# Patient Record
Sex: Male | Born: 1975
Health system: Southern US, Community
[De-identification: ages and names within clinical notes are randomized; demographics above are authoritative.]

## PROBLEM LIST (undated history)

## (undated) DIAGNOSIS — I10 Essential (primary) hypertension: Secondary | ICD-10-CM

## (undated) DIAGNOSIS — Z9281 Personal history of extracorporeal membrane oxygenation (ECMO): Secondary | ICD-10-CM

## (undated) DIAGNOSIS — D72819 Decreased white blood cell count, unspecified: Secondary | ICD-10-CM

## (undated) DIAGNOSIS — R519 Headache, unspecified: Secondary | ICD-10-CM

## (undated) DIAGNOSIS — R0602 Shortness of breath: Secondary | ICD-10-CM

## (undated) DIAGNOSIS — D696 Thrombocytopenia, unspecified: Secondary | ICD-10-CM

## (undated) DIAGNOSIS — Z8616 Personal history of COVID-19: Secondary | ICD-10-CM

## (undated) DIAGNOSIS — R239 Unspecified skin changes: Secondary | ICD-10-CM

## (undated) DIAGNOSIS — Z7189 Other specified counseling: Secondary | ICD-10-CM

## (undated) DIAGNOSIS — J45909 Unspecified asthma, uncomplicated: Secondary | ICD-10-CM

## (undated) DIAGNOSIS — R7303 Prediabetes: Secondary | ICD-10-CM

## (undated) DIAGNOSIS — Z87442 Personal history of urinary calculi: Secondary | ICD-10-CM

## (undated) DIAGNOSIS — K859 Acute pancreatitis without necrosis or infection, unspecified: Secondary | ICD-10-CM

## (undated) DIAGNOSIS — Z515 Encounter for palliative care: Secondary | ICD-10-CM

## (undated) DIAGNOSIS — I209 Angina pectoris, unspecified: Secondary | ICD-10-CM

## (undated) DIAGNOSIS — Z9981 Dependence on supplemental oxygen: Secondary | ICD-10-CM

## (undated) DIAGNOSIS — R4589 Other symptoms and signs involving emotional state: Secondary | ICD-10-CM

## (undated) DIAGNOSIS — Z9689 Presence of other specified functional implants: Secondary | ICD-10-CM

## (undated) DIAGNOSIS — R0600 Dyspnea, unspecified: Secondary | ICD-10-CM

## (undated) DIAGNOSIS — A499 Bacterial infection, unspecified: Secondary | ICD-10-CM

## (undated) DIAGNOSIS — Z01810 Encounter for preprocedural cardiovascular examination: Secondary | ICD-10-CM

## (undated) DIAGNOSIS — R509 Fever, unspecified: Secondary | ICD-10-CM

## (undated) DIAGNOSIS — R234 Changes in skin texture: Secondary | ICD-10-CM

## (undated) DIAGNOSIS — F419 Anxiety disorder, unspecified: Secondary | ICD-10-CM

## (undated) DIAGNOSIS — R1013 Epigastric pain: Secondary | ICD-10-CM

## (undated) DIAGNOSIS — K219 Gastro-esophageal reflux disease without esophagitis: Secondary | ICD-10-CM

## (undated) DIAGNOSIS — F32A Depression, unspecified: Secondary | ICD-10-CM

## (undated) HISTORY — DX: Shortness of breath: R06.02

## (undated) HISTORY — DX: Epigastric pain: R10.13

## (undated) HISTORY — DX: Essential (primary) hypertension: I10

## (undated) HISTORY — DX: Other specified counseling: Z71.89

## (undated) HISTORY — DX: Encounter for palliative care: Z51.5

## (undated) HISTORY — DX: Presence of other specified functional implants: Z96.89

## (undated) HISTORY — DX: Encounter for preprocedural cardiovascular examination: Z01.810

## (undated) HISTORY — DX: Acute pancreatitis without necrosis or infection, unspecified: K85.90

## (undated) HISTORY — DX: Thrombocytopenia, unspecified: D69.6

## (undated) HISTORY — DX: Unspecified skin changes: R23.9

## (undated) HISTORY — PX: PORT-A-CATH REMOVAL: SHX5289

## (undated) HISTORY — DX: Depression, unspecified: F32.A

## (undated) HISTORY — DX: Fever, unspecified: R50.9

## (undated) HISTORY — DX: Changes in skin texture: R23.4

## (undated) HISTORY — DX: Personal history of COVID-19: Z86.16

## (undated) HISTORY — DX: Decreased white blood cell count, unspecified: D72.819

## (undated) HISTORY — DX: Prediabetes: R73.03

## (undated) HISTORY — DX: Bacterial infection, unspecified: A49.9

## (undated) HISTORY — DX: Personal history of extracorporeal membrane oxygenation (ECMO): Z92.81

## (undated) HISTORY — DX: Other symptoms and signs involving emotional state: R45.89

## (undated) HISTORY — DX: Unspecified asthma, uncomplicated: J45.909

---

## 2000-08-08 HISTORY — PX: LUMBAR DISC SURGERY: SHX700

## 2000-10-27 ENCOUNTER — Emergency Department (HOSPITAL_COMMUNITY): Admission: EM | Admit: 2000-10-27 | Discharge: 2000-10-27 | Payer: Self-pay | Admitting: Emergency Medicine

## 2001-06-25 ENCOUNTER — Encounter: Payer: Self-pay | Admitting: Neurosurgery

## 2001-06-27 ENCOUNTER — Ambulatory Visit (HOSPITAL_COMMUNITY): Admission: RE | Admit: 2001-06-27 | Discharge: 2001-06-28 | Payer: Self-pay | Admitting: Neurosurgery

## 2001-06-27 ENCOUNTER — Encounter: Payer: Self-pay | Admitting: Neurosurgery

## 2001-08-27 ENCOUNTER — Emergency Department (HOSPITAL_COMMUNITY): Admission: EM | Admit: 2001-08-27 | Discharge: 2001-08-27 | Payer: Self-pay | Admitting: *Deleted

## 2005-10-12 ENCOUNTER — Emergency Department (HOSPITAL_COMMUNITY): Admission: EM | Admit: 2005-10-12 | Discharge: 2005-10-12 | Payer: Self-pay | Admitting: Family Medicine

## 2006-10-03 ENCOUNTER — Emergency Department (HOSPITAL_COMMUNITY): Admission: EM | Admit: 2006-10-03 | Discharge: 2006-10-03 | Payer: Self-pay | Admitting: Family Medicine

## 2008-01-07 ENCOUNTER — Emergency Department (HOSPITAL_COMMUNITY): Admission: EM | Admit: 2008-01-07 | Discharge: 2008-01-07 | Payer: Self-pay | Admitting: Emergency Medicine

## 2008-11-05 ENCOUNTER — Emergency Department (HOSPITAL_COMMUNITY): Admission: EM | Admit: 2008-11-05 | Discharge: 2008-11-05 | Payer: Self-pay | Admitting: Family Medicine

## 2009-09-17 ENCOUNTER — Emergency Department (HOSPITAL_COMMUNITY): Admission: EM | Admit: 2009-09-17 | Discharge: 2009-09-17 | Payer: Self-pay | Admitting: Emergency Medicine

## 2009-10-08 ENCOUNTER — Ambulatory Visit: Payer: Self-pay | Admitting: Sports Medicine

## 2009-10-08 DIAGNOSIS — G5750 Tarsal tunnel syndrome, unspecified lower limb: Secondary | ICD-10-CM | POA: Insufficient documentation

## 2009-10-08 DIAGNOSIS — M2140 Flat foot [pes planus] (acquired), unspecified foot: Secondary | ICD-10-CM

## 2009-10-08 HISTORY — DX: Flat foot (pes planus) (acquired), unspecified foot: M21.40

## 2009-10-08 HISTORY — DX: Tarsal tunnel syndrome, unspecified lower limb: G57.50

## 2009-11-12 ENCOUNTER — Ambulatory Visit: Payer: Self-pay | Admitting: Sports Medicine

## 2009-11-12 DIAGNOSIS — R269 Unspecified abnormalities of gait and mobility: Secondary | ICD-10-CM | POA: Insufficient documentation

## 2009-11-12 HISTORY — DX: Unspecified abnormalities of gait and mobility: R26.9

## 2009-12-14 ENCOUNTER — Emergency Department (HOSPITAL_COMMUNITY): Admission: EM | Admit: 2009-12-14 | Discharge: 2009-12-14 | Payer: Self-pay | Admitting: Emergency Medicine

## 2010-09-07 NOTE — Assessment & Plan Note (Signed)
Summary: FOOT PAIN,MC   Vital Signs:  Patient profile:   35 year old male Height:      68 inches Weight:      278 pounds BMI:     42.42 BP sitting:   146 / 77  Vitals Entered By: Lillia Pauls CMA (October 08, 2009 9:23 AM)  History of Present Illness: 35 yo M here with left foot pain.  No known injury Started about 2 years ago and has progressed Worse at end of day more than at beginning Not worse with first step in the morning.  Pain mostly medial ankle, into medial arch and sometimes onto dorsum of foot - feels sharp at times No numbness or tingling No issues with right foot Has not sought care or treatment for this in past.  Physical Exam  General:  Well-developed, overweight male in NAD Msk:  L ankle/foot: Significant pes planus with almost subluxation of ankle.  Ambulates with outturned bilateral feet No focal TTP currently (not in pain now). FROM ankle and toes Strength 5/5 all motions Arch corrects with post tib firing (up on toes). Neg tinels at tarsal tunnel NV intact distally. Additional Exam:  MSK u/s:  Target sign of post tib L > R - fluid in tarsal tunnel on left side only.  No tears of post tib or flexor digitorum.  Thickened post tib as well.  Plantar fascia at upper limit of normal but no abnormalities.  Images saved for documentation.   Impression & Recommendations:  Problem # 1:  TARSAL TUNNEL SYNDROME, LEFT (ICD-355.5) Assessment New  Comforthotics with small scaphoid pad to correct pes planus and stretch on tarsal tunnel.  No evidence of tears of tendons.  Exercises for post tib (toe raises, theraband int rotation and plantarflexion).  Orders: [employer] [phone])  Problem # 2:  PES PLANUS (ICD-734) Assessment: Unchanged comforthotics with scaphoid pad.  Orders: [employer] [phone])  Appended Document: FOOT PAIN,MC Will f/u in 4-6 weeks for reeval, consideration of custom orthotics vs continued comforthotics

## 2010-09-07 NOTE — Assessment & Plan Note (Signed)
Summary: F/U FOOT,MC   History of Present Illness: 35 yo M here for f/u L foot pain  No injury Started about 2 years ago and progressed Worse at end of day more than at beginning Is on feet a lot at work. No numbness or tingling. Pain mostly medial ankle, into medial arch and sometimes onto dorsum of foot - feels sharp at times No issues with right foot Has been doing pigeon toe walking and theraband exercises regularly Using comforthotics with scaphoid pads Is about 60% improved from last visit  Physical Exam  General:  Well-developed, overweight male in NAD Msk:  L ankle/foot: Significant pes planus with almost subluxation of ankle.  Ambulates with outturned bilateral feet No focal TTP. FROM ankle and toes Strength 5/5 all motions 1+ talar tilt, neg ant drawer. Arch corrects with post tib firing. Neg tinels at tarsal tunnel NV intact distally.   Impression & Recommendations:  Problem # 1:  TARSAL TUNNEL SYNDROME, LEFT (ICD-355.5) Assessment Improved Much better - will check with insurance company re: coverage for custom orthotics.  F/u with [place] in 6 weeks for reevaluation, consideration of making custom orthotics vs giving him the [employer] booklet and showing him how to order the comforthotics and scaphoid pads.  Will bring work shoes to that visit.  Problem # 2:  PES PLANUS (ICD-734) Assessment: Unchanged better support with comforthotics and scaphoid pads.  See #1 above.  Problem # 3:  ABNORMALITY OF GAIT (ICD-781.2) Assessment: Improved improved with comforthotics and scaphoid pads.

## 2010-11-02 ENCOUNTER — Inpatient Hospital Stay (INDEPENDENT_AMBULATORY_CARE_PROVIDER_SITE_OTHER)
Admission: RE | Admit: 2010-11-02 | Discharge: 2010-11-02 | Disposition: A | Discharge status: Home / Self Care | Payer: Managed Care, Other (non HMO) | Source: Ambulatory Visit | Attending: Emergency Medicine | Admitting: Emergency Medicine

## 2010-11-02 DIAGNOSIS — IMO0002 Reserved for concepts with insufficient information to code with codable children: Secondary | ICD-10-CM

## 2010-12-24 NOTE — H&P (Signed)
[place]. [facility]  Patient:    [name], [name] Visit Number: [id number] MRN: [id number]          Service Type: DSU Location: [facility] 3172 01 Attending Physician:  Colon Branch Dictated by:   Clydene Fake, M.D. Admit Date:  06/27/2001                           History and Physical  CHIEF COMPLAINT:  Left leg pain.  HISTORY OF PRESENT ILLNESS:  The patient is a 35 year old, right-handed gentleman who ______.  He started having some left-sided back pain that was fairly severe.  He started having more and more left leg pain.  The back pain subsided some, but the leg pain has continued.  He has been on multiple nonsteroidal anti-inflammatory medications, steroid dose packs, and pain medications.  These helped temporarily, but he continues having leg pain. Overall he thinks are getting worse as far as the pain down his leg goes.  He may have a little leg weakness, but no specific area, having noticed only mild intermittent numbness.  No right-sided symptoms.  Minimal back pain.  No bowel or bladder incontinence or changes.  Sitting too long or ambulating too long or too active also increases the pain.  He has been out of work most of the time since August due to the pain.  PAST MEDICAL HISTORY:  Significant for asthma.  No previous operations.  CURRENT MEDICATIONS:  Nexium, Advair, Flonase, Maxair inhaler p.r.n.  ALLERGIES:  No known drug allergies.  SOCIAL HISTORY:  He is married.  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  He does not smoke or drink alcohol.  No litigation pending.  REVIEW OF SYSTEMS:  Otherwise negative.  FAMILY HISTORY:  Noncontributory.  PHYSICAL EXAMINATION:  Weight 288 pounds.  Height 5 feet 8 inches.  VITAL SIGNS:  Blood pressure 160/87, pulse 79, temperature 98.6 degrees.  HEENT:  Unremarkable.  NECK:  Supple.  LUNGS:  Clear.  HEART:  Regular rhythm.  ABDOMEN:  Obese, soft, and nontender.  EXTREMITIES:  Intact.   No edema.  BACK:  Range of motion of the back done well in flexion and extension, but increase in radicular pain is brought on with the flexion and left lateral bending.  NEUROLOGIC:  Gait is normal.  He can ambulate on heels and toes.  He gets on his toes 10 times.  Straight leg raising is positive on the left and negative on the right.  Motor strength and sensation are intact.  Deep tendon reflexes are slightly decreased in the left ankle compared to the right.  LABORATORY DATA:  Review of the MRI of the lumbar spine shows degenerative disk disease at L5-S1 with very large central and left-sided disk herniation with extruded fragment at the left side with compression of the L5 and S1 roots and some canal compromise also.  ASSESSMENT AND PLAN:  The patient has a large herniated disk.  The patient will be admitted for diskectomy. Dictated by:   Clydene Fake, M.D. Attending Physician:  Colon Branch DD:  06/27/01 TD:  06/27/01 Job: [id number]

## 2010-12-24 NOTE — Op Note (Signed)
[place]. [facility]  Patient:    [name], [name] Visit Number: [id number] MRN: [id number]          Service Type: DSU Location: 3000 3022 01 Attending Physician:  Colon Branch Dictated by:   Clydene Fake, M.D. Proc. Date: 06/27/01 Admit Date:  06/27/2001                             Operative Report  PREOPERATIVE DIAGNOSIS:   Herniated nucleus pulposus left L5-S1.  POSTOPERATIVE DIAGNOSIS:  Herniated nucleus pulposus left L5-S1.  OPERATION:  Left L5-S1 semi-hemilaminectomy and diskectomy, microdissection with microscope.  SURGEON:  Clydene Fake, M.D.  ASSISTANT:  Izell Moyock. Elesa Hacker, M.D.  ANESTHESIA:  General endotracheal tube anesthesia.  ESTIMATED BLOOD LOSS:  Minimal.  BLOOD GIVEN:  None.  DRAINS:  None.  COMPLICATIONS:  None.  INDICATIONS: The patient is a 35 year old gentleman who for three months has had severe leg pain radiating down S1 distribution with some general weakness and tingling.  An MRI was done showing a large central and left sided disk rupture at L5-S1 causing compression and canal stenosis.  The patient has only been helped intermittently and temporarily by various medications.  The patient is brought in for a diskectomy.  DESCRIPTION OF PROCEDURE:  The patient was brought into the operating room. General anesthesia was induced.  The patient was placed in the prone position on the Wilson frame with all pressure points padded.  The patient was prepped and draped in a sterile fashion.  A needle was placed in the midline of the lumbar spine at where was thought to be the L5-S1 interspace.  X-ray was obtained showing this was at the L5 spinous process.  Incision was then made centered just caudally to where the needle entered the skin.  Incision was taken down to the fascia, and hemostasis was obtained with Bovie cauterization.  The fascia was incised over the L5-S1 spinous processes and subperiosteal dissection was  done over the lamina to the facet.  A marker was placed at the interspace and another x-ray was obtained showing this was the L5-S1 interspace.  Microscope was brought onto the field for microdissection at this point.  The high speed drill and the Kerrison punches were used to perform a semi-hemilaminectomy at the L5-S1 level.  Foraminotomy was done on the S1 root.  The thecal sac was reflected medially and large disk fragment was seen.  Disk space was incised with a 15 blade and diskectomy was performed with pituitary rongeurs and the curets.  After diskectomy, the thecal sac and nerve roots were decompressed.  There was no pressure on the L5-S1 root.  The wound was irrigated with antibiotic solution.  Gelfoam and thrombin was placed in the epidural space and this was irrigated down.  The fascia was then closed with 0 Vicryl interrupted sutures.  The subcutaneous tissue was closed with 0, 2-0 and 3-0 Vicryl interrupted sutures, and the skin closed with Steri-Strips. Dressing was placed.  The patient was placed back in the supine position and awakened from anesthesia, transferred to the recovery room in stable condition. Dictated by:   Clydene Fake, M.D. Attending Physician:  Colon Branch DD:  06/27/01 TD:  06/28/01 Job: [id number]

## 2011-11-28 IMAGING — CR DG FOOT COMPLETE 3+V*L*
3 series · 3 of 3 positions shown · non-contrast
Comparison: None

CLINICAL DATA: Left foot pain

LEFT FOOT - COMPLETE 3+ VIEW

[view not recorded (1 of 3)]
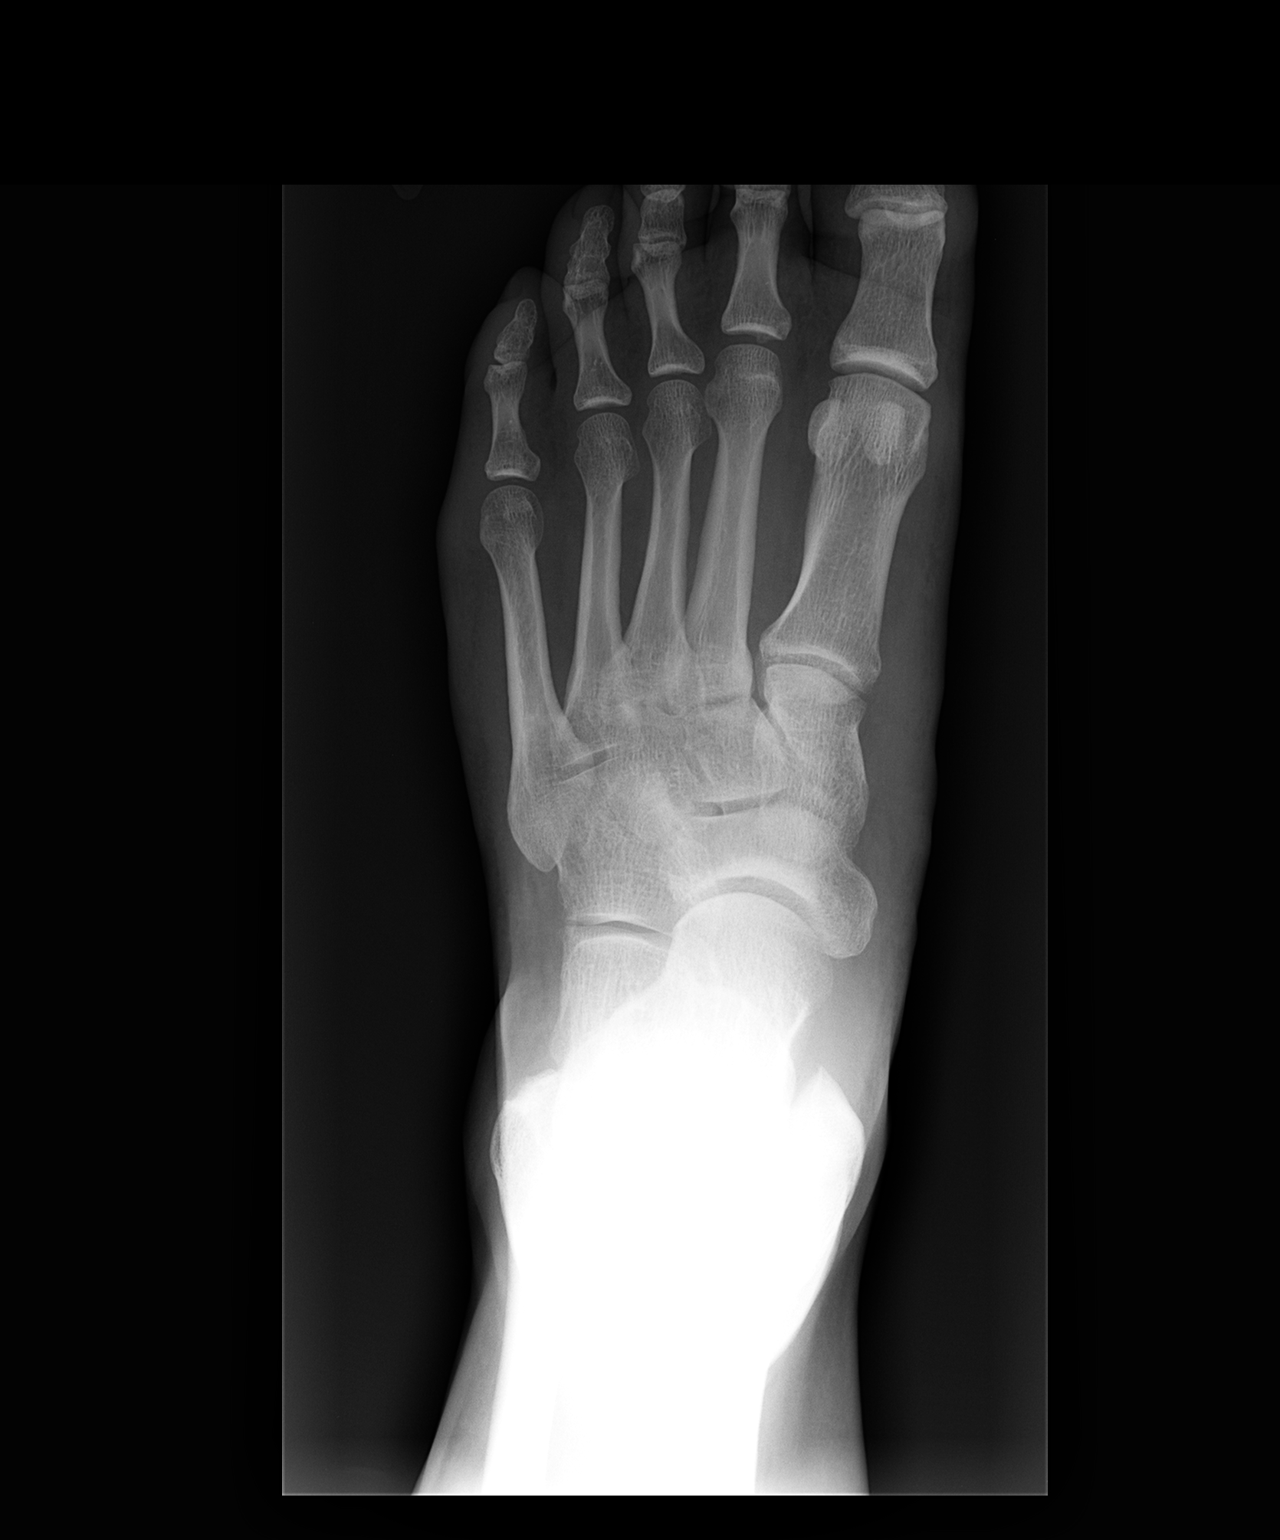

[view not recorded (2 of 3)]
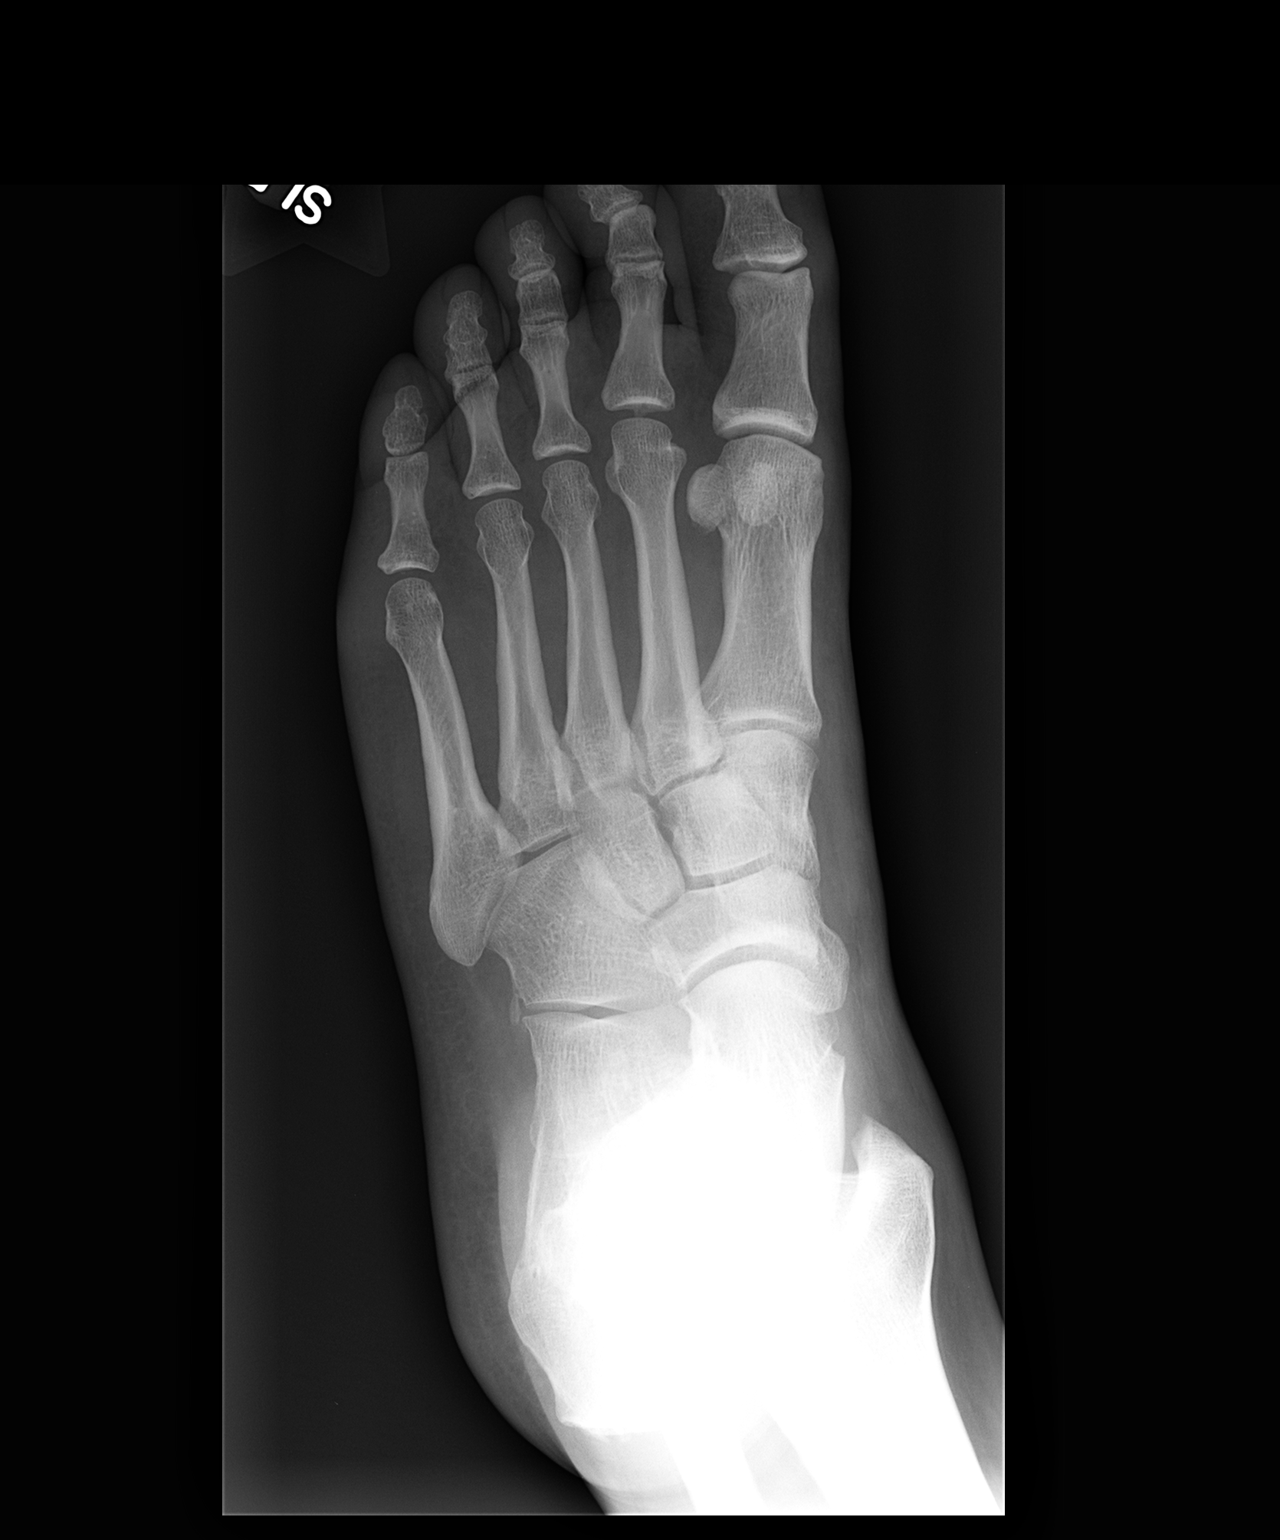

[view not recorded (3 of 3)]
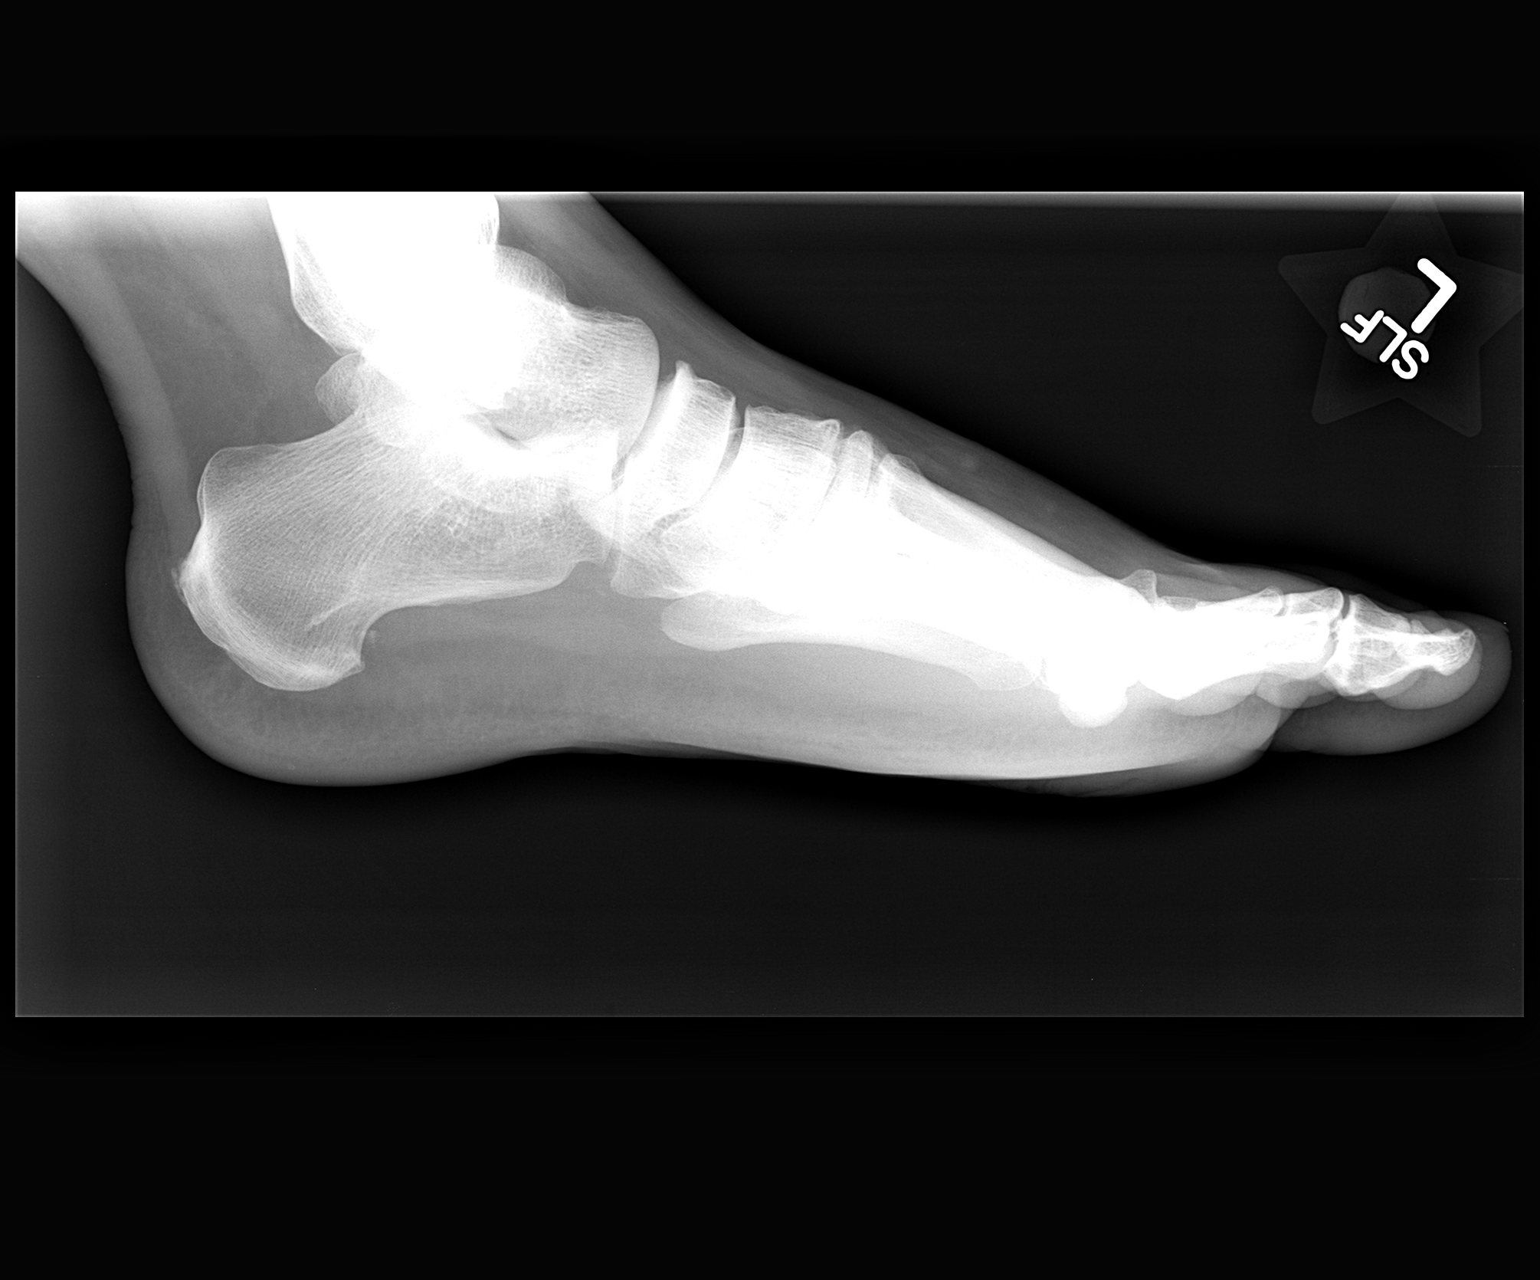

[3 of 3 positions shown; findings below may reference images not displayed]

FINDINGS: No evidence of acute fracture, dislocation or bony
lesion.  Mild degenerative changes are seen in the tarsal bones.
IMPRESSION: No acute findings.

## 2015-07-21 ENCOUNTER — Ambulatory Visit (INDEPENDENT_AMBULATORY_CARE_PROVIDER_SITE_OTHER): Payer: Managed Care, Other (non HMO) | Admitting: Allergy and Immunology

## 2015-07-21 ENCOUNTER — Encounter: Payer: Self-pay | Admitting: Allergy and Immunology

## 2015-07-21 VITALS — BP 152/102 | HR 72 | Resp 20

## 2015-07-21 DIAGNOSIS — K219 Gastro-esophageal reflux disease without esophagitis: Secondary | ICD-10-CM

## 2015-07-21 DIAGNOSIS — J4540 Moderate persistent asthma, uncomplicated: Secondary | ICD-10-CM

## 2015-07-21 DIAGNOSIS — H1010 Acute atopic conjunctivitis, unspecified eye: Secondary | ICD-10-CM | POA: Insufficient documentation

## 2015-07-21 DIAGNOSIS — J4541 Moderate persistent asthma with (acute) exacerbation: Secondary | ICD-10-CM

## 2015-07-21 DIAGNOSIS — J309 Allergic rhinitis, unspecified: Secondary | ICD-10-CM

## 2015-07-21 HISTORY — DX: Moderate persistent asthma, uncomplicated: J45.40

## 2015-07-21 HISTORY — DX: Acute atopic conjunctivitis, unspecified eye: H10.10

## 2015-07-21 MED ORDER — METHYLPREDNISOLONE ACETATE 80 MG/ML IJ SUSP
80.0000 mg | Freq: Once | INTRAMUSCULAR | Status: AC
  Administered 2015-07-21: 80 mg via INTRAMUSCULAR

## 2015-07-21 NOTE — Patient Instructions (Signed)
  1. Depo-Medrol 80 IM today  2. Dulera 200 2 inhalations twice a day - samples  3. OTC Rhinocort one spray each nostril one time per day  4. Dexilant 60 mg one tablet one time per day - samples  5. Zyrtec and pro-air respiclick if needed  6. Yearly flu vaccine  7. Return in 6 months or earlier if problem

## 2015-07-21 NOTE — Progress Notes (Signed)
[facility]  Follow-up Note  Refering Provider: No ref. provider found Primary Provider: Tamsen Roers, MD  Subjective:   [name] is a 39 y.o. male who returns to the [facility] in re-evaluation of the following:  HPI Comments:  [name] returns to this clinic on 07/21/2015 in reevaluation of his asthma and allergic rhinoconjunctivitis. He's done quite well over the course of the past 6 months while consistently using his Dulera. She has not required a systemic steroid to treat his asthma and rarely uses any short acting bronchodilator. However, 2 weeks ago in conjunction with nasal congestion and sneezing and some postnasal drip he developed coughing and wheezing that is been progressive. He does not have any ugly nasal discharge or ugly sputum production or anosmia or fever. His reflux is under good control while using his Dexilant.   Outpatient Encounter Prescriptions as of 07/21/2015  Medication Sig  . cetirizine (ZYRTEC) 10 MG tablet Take 10 mg by mouth daily.  Marland Kitchen dexlansoprazole (DEXILANT) 60 MG capsule Take 60 mg by mouth daily.  . mometasone-formoterol (DULERA) 200-5 MCG/ACT AERO Inhale 2 puffs into the lungs 2 (two) times daily.  Marland Kitchen PROAIR RESPICLICK 209 (90 BASE) MCG/ACT AEPB Inhale 2 Doses into the lungs every 4 (four) hours as needed.   No facility-administered encounter medications on file as of 07/21/2015.    No orders of the defined types were placed in this encounter.    Past Medical History  Diagnosis Date  . Asthma     History reviewed. No pertinent past surgical history.  Allergies no known allergies  Review of Systems  Constitutional: Negative for fever, chills and fatigue.  HENT: Positive for congestion and sneezing. Negative for ear pain, facial swelling, hearing loss, nosebleeds, postnasal drip, rhinorrhea, sinus pressure, sore throat, tinnitus, trouble swallowing and voice change.    Eyes: Negative for pain, discharge, redness and itching.  Respiratory: Positive for cough and wheezing. Negative for chest tightness and shortness of breath.   Cardiovascular: Negative for chest pain and leg swelling.  Gastrointestinal: Negative for nausea, vomiting and abdominal pain.  Musculoskeletal: Negative for myalgias and arthralgias.  Skin: Negative for rash.  Allergic/Immunologic: Negative.  Negative for immunocompromised state.  Neurological: Negative for dizziness and headaches.  Hematological: Negative for adenopathy. Does not bruise/bleed easily.     Objective:   Filed Vitals:   07/21/15 1815  BP: 152/102  Pulse: 72  Resp: 20          Physical Exam  Constitutional: He appears well-developed and well-nourished. No distress.  HENT:  Head: Normocephalic and atraumatic. Head is without right periorbital erythema and without left periorbital erythema.  Right Ear: Tympanic membrane, external ear and ear canal normal. No drainage or tenderness. No foreign bodies. Tympanic membrane is not injected, not scarred, not perforated, not erythematous, not retracted and not bulging. No middle ear effusion.  Left Ear: Tympanic membrane, external ear and ear canal normal. No drainage or tenderness. No foreign bodies. Tympanic membrane is not injected, not scarred, not perforated, not erythematous, not retracted and not bulging.  No middle ear effusion.  Nose: Mucosal edema present. No rhinorrhea, nose lacerations or sinus tenderness.  No foreign bodies.  Mouth/Throat: Oropharynx is clear and moist. No oropharyngeal exudate, posterior oropharyngeal edema, posterior oropharyngeal erythema or tonsillar abscesses.  Eyes: Lids are normal. Right eye exhibits no chemosis, no discharge and no exudate. No foreign body present in the right eye. Left  eye exhibits no chemosis, no discharge and no exudate. No foreign body present in the left eye. Right conjunctiva is not injected. Left conjunctiva is  not injected.  Neck: Neck supple. No tracheal tenderness present. No tracheal deviation and no edema present. No thyroid mass and no thyromegaly present.  Cardiovascular: Normal rate, regular rhythm, S1 normal and S2 normal.  Exam reveals no gallop.   No murmur heard. Pulmonary/Chest: No accessory muscle usage or stridor. No respiratory distress. He has wheezes (end expiratory wheezes). He has no rhonchi. He has no rales.  Abdominal: Soft.  Lymphadenopathy:       Head (right side): No tonsillar adenopathy present.       Head (left side): No tonsillar adenopathy present.    He has no cervical adenopathy.  Neurological: He is alert.  Skin: No rash noted. He is not diaphoretic.  Psychiatric: He has a normal mood and affect. His behavior is normal.    Diagnostics:    Spirometry was performed and demonstrated an FEV1 of 3.71 at 98 % of predicted.  The patient had an Asthma Control Test with the following results: ACT Total Score: 16.    Assessment and Plan:   1. Moderate persistent asthma, with acute exacerbation   2. Allergic rhinoconjunctivitis   3. Gastroesophageal reflux disease, esophagitis presence not specified      1. Depo-Medrol 80 IM today  2. Dulera 200 2 inhalations twice a day - samples  3. OTC Rhinocort one spray each nostril one time per day  4. Dexilant 60 mg one tablet one time per day - samples  5. Zyrtec and pro-air respiclick if needed  6. Yearly flu vaccine  7. Return in 6 months or earlier if problem  [name] is had an exacerbation of his asthma most likely secondary to viral respiratory tract infection that occurred a few weeks back. We'll treat him with the therapy mentioned above and assume he'll do well and see him back in this clinic in 6 months or earlier if there is a problem.   [name], MD [place]

## 2015-10-13 ENCOUNTER — Encounter: Payer: Self-pay | Admitting: Allergy and Immunology

## 2015-10-13 ENCOUNTER — Ambulatory Visit (INDEPENDENT_AMBULATORY_CARE_PROVIDER_SITE_OTHER): Payer: Managed Care, Other (non HMO) | Admitting: Allergy and Immunology

## 2015-10-13 VITALS — BP 140/98 | HR 100 | Resp 18

## 2015-10-13 DIAGNOSIS — H1010 Acute atopic conjunctivitis, unspecified eye: Secondary | ICD-10-CM | POA: Diagnosis not present

## 2015-10-13 DIAGNOSIS — J309 Allergic rhinitis, unspecified: Secondary | ICD-10-CM | POA: Diagnosis not present

## 2015-10-13 DIAGNOSIS — K219 Gastro-esophageal reflux disease without esophagitis: Secondary | ICD-10-CM

## 2015-10-13 DIAGNOSIS — J4541 Moderate persistent asthma with (acute) exacerbation: Secondary | ICD-10-CM | POA: Diagnosis not present

## 2015-10-13 MED ORDER — FLUTICASONE FUROATE-VILANTEROL 200-25 MCG/INH IN AEPB: 1.0000 | INHALATION_SPRAY | Freq: Every day | RESPIRATORY_TRACT | Status: DC

## 2015-10-13 NOTE — Progress Notes (Signed)
Follow-up Note  Referring Provider: Tamsen Roers, MD Primary Provider: Tamsen Roers, MD Date of Office Visit: 10/13/2015  Subjective:   [name] (DOB: Jul 08, 1976) is a 40 y.o. male who returns to the [facility] on 10/13/2015 in re-evaluation of the following:  HPI Comments: [name] returns to this clinic in reevaluation of his asthma and allergic rhinoconjunctivitis and reflux. Since the spring has arrived he has developed problems with ear fullness and some nasal congestion and coughing and wheezing and has had use his bronchodilator over the course of the past 10 days. He is unfortunately out of almost all his medicines. His reflux is doing okay while using over-the-counter omeprazole. Prior to this point in time he did relatively well throughout the fall and winter and did not require any systemic steroids alert than the administration of systemic steroid on December 14 at which time he appeared to have what may of been a viral-induced flare of his respiratory tract disease.    Outpatient Prescriptions Prior to Visit  Medication Sig Dispense Refill  . cetirizine (ZYRTEC) 10 MG tablet Take 10 mg by mouth daily.    . mometasone-formoterol (DULERA) 200-5 MCG/ACT AERO Inhale 2 puffs into the lungs 2 (two) times daily.    Marland Kitchen PROAIR RESPICLICK 638 (90 BASE) MCG/ACT AEPB Inhale 2 Doses into the lungs every 4 (four) hours as needed.  1  . dexlansoprazole (DEXILANT) 60 MG capsule Take 60 mg by mouth daily.     No facility-administered medications prior to visit.    Past Medical History  Diagnosis Date  . Asthma     No past surgical history on file.  No Known Allergies  Review of systems negative except as noted in HPI / PMHx or noted below:  Review of Systems  Constitutional: Negative.   HENT: Negative.   Eyes: Negative.   Respiratory: Negative.   Cardiovascular: Negative.   Gastrointestinal: Negative.   Genitourinary: Negative.   Musculoskeletal:  Negative.   Skin: Negative.   Neurological: Negative.   Endo/Heme/Allergies: Negative.   Psychiatric/Behavioral: Negative.      Objective:   Filed Vitals:   10/13/15 1804  BP: 140/98  Pulse: 100  Resp: 18          Physical Exam  Constitutional: He is well-developed, well-nourished, and in no distress.  HENT:  Head: Normocephalic.  Right Ear: External ear and ear canal normal. A middle ear effusion is present.  Left Ear: External ear and ear canal normal. A middle ear effusion is present.  Nose: Mucosal edema present. No rhinorrhea.  Mouth/Throat: Uvula is midline, oropharynx is clear and moist and mucous membranes are normal. No oropharyngeal exudate.  Eyes: Conjunctivae are normal.  Neck: Trachea normal. No tracheal tenderness present. No tracheal deviation present. No thyromegaly present.  Cardiovascular: Normal rate, regular rhythm, S1 normal, S2 normal and normal heart sounds.   No murmur heard. Pulmonary/Chest: No stridor. No respiratory distress. He has wheezes (end expiratory wheezing). He has no rales.  Musculoskeletal: He exhibits no edema.  Lymphadenopathy:       Head (right side): No tonsillar adenopathy present.       Head (left side): No tonsillar adenopathy present.    He has no cervical adenopathy.    He has no axillary adenopathy.  Neurological: He is alert. Gait normal.  Skin: No rash noted. He is not diaphoretic. No erythema. Nails show no clubbing.  Psychiatric: Mood and affect normal.    Diagnostics:  Spirometry was performed and demonstrated an FEV1 of 3.57 at 94 % of predicted.  The patient had an Asthma Control Test with the following results: ACT Total Score: 15.    Assessment and Plan:   1. Moderate persistent asthma, with acute exacerbation   2. Allergic rhinoconjunctivitis   3. Gastroesophageal reflux disease, esophagitis presence not specified     1. Prednisone 10 mg one tablet one time per day for the next 10 days  2. Breo 200  one inhalation 1 time per day. Coupon.  3. OTC Rhinocort one spray each nostril one time per day. Coupon.  4. Continue over-the-counter omeprazole 20 mg daily  5. Zyrtec and pro-air respiclick if needed  6. Return in 6 months or earlier if problem   I will have Wendy restart [place] and over-the-counter Rhinocort on a consistent basis and I've given him a very short course of systemic steroids at relatively low dose to help with his respiratory tract inflammation. I'll see him back in this clinic in approximately 6 months or earlier if there is a problem. I've had a talk with him in the past about using immunotherapy to treat his rather significant seasonal allergies but at this point in time he is really not interested in undergoing this form of treatment.  [name], MD [place]

## 2015-10-13 NOTE — Patient Instructions (Signed)
  1. Prednisone 10 mg one tablet one time per day for the next 10 days  2. Breo 200 one inhalation 1 time per day. Coupon.  3. OTC Rhinocort one spray each nostril one time per day. Coupon.  4. Continue over-the-counter omeprazole 20 mg daily  5. Zyrtec and pro-air respiclick if needed  6. Return in 6 months or earlier if problem

## 2015-12-15 ENCOUNTER — Encounter: Payer: Self-pay | Admitting: Allergy and Immunology

## 2015-12-15 ENCOUNTER — Ambulatory Visit (INDEPENDENT_AMBULATORY_CARE_PROVIDER_SITE_OTHER): Payer: Managed Care, Other (non HMO) | Admitting: Allergy and Immunology

## 2015-12-15 VITALS — BP 152/108 | HR 72 | Resp 18

## 2015-12-15 DIAGNOSIS — K219 Gastro-esophageal reflux disease without esophagitis: Secondary | ICD-10-CM

## 2015-12-15 DIAGNOSIS — J309 Allergic rhinitis, unspecified: Secondary | ICD-10-CM

## 2015-12-15 DIAGNOSIS — H1010 Acute atopic conjunctivitis, unspecified eye: Secondary | ICD-10-CM | POA: Diagnosis not present

## 2015-12-15 DIAGNOSIS — J4541 Moderate persistent asthma with (acute) exacerbation: Secondary | ICD-10-CM

## 2015-12-15 MED ORDER — BUDESONIDE-FORMOTEROL FUMARATE 160-4.5 MCG/ACT IN AERO: INHALATION_SPRAY | RESPIRATORY_TRACT | Status: DC

## 2015-12-15 NOTE — Patient Instructions (Addendum)
  1. Prednisone 10 mg one tablet one time per day for the next 10 days  2. Symbicort 160 - two inhalations twice a day. Zero [employer].  3. OTC Rhinocort one spray each nostril one time per day. Coupon.  4. Continue over-the-counter omeprazole 20 mg daily  5. Zyrtec and pro-air respiclick if needed  6. Return in 6 months or earlier if problem

## 2015-12-15 NOTE — Progress Notes (Signed)
Follow-up Note  Referring Provider: Tamsen Roers, MD Primary Provider: Tamsen Roers, MD Date of Office Visit: 12/15/2015  Subjective:   [name] (DOB: [birth year]) is a 40 y.o. male who returns to the [facility] on 12/15/2015 in re-evaluation of the following:  HPI: [name] returns to this clinic in reevaluation of his asthma and allergic rhinoconjunctivitis and reflux. His nose is been doing quite well while consistently using his Rhinocort. His asthma was doing wonderful when he had Breo but unfortunately he does not have the finances to fill his prescription and thus when he stopped this medication he developed wheezing and coughing and has been using his bronchodilator extensively especially over the course of the past week. There is no associated fever or ugly nasal discharge or ugly sputum production or chest pain.    Medication List           cetirizine 10 MG tablet  Commonly known as:  ZYRTEC  Take 10 mg by mouth daily.     fluticasone 50 MCG/ACT nasal spray  Commonly known as:  FLONASE  Reported on 12/15/2015     fluticasone furoate-vilanterol 200-25 MCG/INH Aepb  Commonly known as:  BREO ELLIPTA  Inhale 1 puff into the lungs daily.     omeprazole 20 MG capsule  Commonly known as:  PRILOSEC  Take 20 mg by mouth daily.     PROAIR RESPICLICK 902 (90 Base) MCG/ACT Aepb  Generic drug:  Albuterol Sulfate  Inhale 2 Doses into the lungs every 4 (four) hours as needed.        Past Medical History  Diagnosis Date  . Asthma     Past Surgical History  Procedure Laterality Date  . Back surgery      No Known Allergies  Review of systems negative except as noted in HPI / PMHx or noted below:  Review of Systems  Constitutional: Negative.   HENT: Negative.   Eyes: Negative.   Respiratory: Negative.   Cardiovascular: Negative.   Gastrointestinal: Negative.   Genitourinary: Negative.   Musculoskeletal: Negative.   Skin: Negative.     Neurological: Negative.   Endo/Heme/Allergies: Negative.   Psychiatric/Behavioral: Negative.      Objective:   Filed Vitals:   12/15/15 1811  BP: 152/108  Pulse: 72  Resp: 18          Physical Exam  Constitutional: He is well-developed, well-nourished, and in no distress.  HENT:  Head: Normocephalic.  Right Ear: Tympanic membrane, external ear and ear canal normal.  Left Ear: Tympanic membrane, external ear and ear canal normal.  Nose: Nose normal. No mucosal edema or rhinorrhea.  Mouth/Throat: Uvula is midline, oropharynx is clear and moist and mucous membranes are normal. No oropharyngeal exudate.  Eyes: Conjunctivae are normal.  Neck: Trachea normal. No tracheal tenderness present. No tracheal deviation present. No thyromegaly present.  Cardiovascular: Normal rate, regular rhythm, S1 normal, S2 normal and normal heart sounds.   No murmur heard. Pulmonary/Chest: Breath sounds normal. No stridor. No respiratory distress. He has no wheezes. He has no rales.  Musculoskeletal: He exhibits no edema.  Lymphadenopathy:       Head (right side): No tonsillar adenopathy present.       Head (left side): No tonsillar adenopathy present.    He has no cervical adenopathy.  Neurological: He is alert. Gait normal.  Skin: No rash noted. He is not diaphoretic. No erythema. Nails show no clubbing.  Psychiatric: Mood and affect normal.  Diagnostics:    Spirometry was performed and demonstrated an FEV1 of 3.70 at 94 % of predicted.  The patient had an Asthma Control Test with the following results: ACT Total Score: 16.    Assessment and Plan:   1. Moderate persistent asthma, with acute exacerbation   2. Allergic rhinoconjunctivitis   3. Gastroesophageal reflux disease, esophagitis presence not specified     1. Prednisone 10 mg one tablet one time per day for the next 10 days  2. Symbicort 160 - two inhalations twice a day. Zero Coca Cola.  3. OTC Rhinocort one spray each  nostril one time per day. Coupon.  4. Continue over-the-counter omeprazole 20 mg daily  5. Zyrtec and pro-air respiclick if needed  6. Return in 6 months or earlier if problem  I have given [name] samples of Symbicort and a $0 co-pay coupon that should help with his acquisition of these medications that are required to control his inflammatory respiratory disease. He'll continue to use Rhinocort and omeprazole for his upper airway inflammation and his reflux respectively. He'll keep in contact with me noting his response to this plan and if he does well I'll see him back in this clinic in 6 months or earlier if there is a problem.  [name] Katz, MD [place]

## 2016-01-18 ENCOUNTER — Ambulatory Visit (HOSPITAL_COMMUNITY)
Admission: RE | Admit: 2016-01-18 | Discharge: 2016-01-18 | Disposition: A | Discharge status: Home / Self Care | Payer: Managed Care, Other (non HMO) | Source: Ambulatory Visit | Attending: Family Medicine | Admitting: Family Medicine

## 2016-01-18 ENCOUNTER — Other Ambulatory Visit (HOSPITAL_COMMUNITY): Payer: Self-pay | Admitting: Family Medicine

## 2016-01-18 DIAGNOSIS — R52 Pain, unspecified: Secondary | ICD-10-CM

## 2016-01-18 DIAGNOSIS — M79672 Pain in left foot: Secondary | ICD-10-CM | POA: Insufficient documentation

## 2016-04-21 ENCOUNTER — Encounter: Payer: Self-pay | Admitting: Podiatry

## 2016-04-21 ENCOUNTER — Ambulatory Visit (INDEPENDENT_AMBULATORY_CARE_PROVIDER_SITE_OTHER): Payer: Managed Care, Other (non HMO)

## 2016-04-21 ENCOUNTER — Ambulatory Visit (INDEPENDENT_AMBULATORY_CARE_PROVIDER_SITE_OTHER): Payer: Managed Care, Other (non HMO) | Admitting: Podiatry

## 2016-04-21 VITALS — Ht 68.0 in | Wt 295.0 lb

## 2016-04-21 DIAGNOSIS — M79672 Pain in left foot: Secondary | ICD-10-CM | POA: Diagnosis not present

## 2016-04-21 DIAGNOSIS — M722 Plantar fascial fibromatosis: Secondary | ICD-10-CM

## 2016-04-21 MED ORDER — DICLOFENAC SODIUM 75 MG PO TBEC: 75.0000 mg | DELAYED_RELEASE_TABLET | Freq: Two times a day (BID) | ORAL | 2 refills | Status: DC

## 2016-04-21 MED ORDER — TRIAMCINOLONE ACETONIDE 10 MG/ML IJ SUSP
10.0000 mg | Freq: Once | INTRAMUSCULAR | Status: AC
  Administered 2016-04-21: 10 mg

## 2016-04-21 NOTE — Patient Instructions (Signed)
Plantar Fasciitis (Heel Spur Syndrome) with Rehab The plantar fascia is a fibrous, ligament-like, soft-tissue structure that spans the bottom of the foot. Plantar fasciitis is a condition that causes pain in the foot due to inflammation of the tissue. SYMPTOMS   Pain and tenderness on the underneath side of the foot.  Pain that worsens with standing or walking. CAUSES  Plantar fasciitis is caused by irritation and injury to the plantar fascia on the underneath side of the foot. Common mechanisms of injury include:  Direct trauma to bottom of the foot.  Damage to a small nerve that runs under the foot where the main fascia attaches to the heel bone.  Stress placed on the plantar fascia due to bone spurs. RISK INCREASES WITH:   Activities that place stress on the plantar fascia (running, jumping, pivoting, or cutting).  Poor strength and flexibility.  Improperly fitted shoes.  Tight calf muscles.  Flat feet.  Failure to warm-up properly before activity.  Obesity. PREVENTION  Warm up and stretch properly before activity.  Allow for adequate recovery between workouts.  Maintain physical fitness:  Strength, flexibility, and endurance.  Cardiovascular fitness.  Maintain a health body weight.  Avoid stress on the plantar fascia.  Wear properly fitted shoes, including arch supports for individuals who have flat feet.  PROGNOSIS  If treated properly, then the symptoms of plantar fasciitis usually resolve without surgery. However, occasionally surgery is necessary.  RELATED COMPLICATIONS   Recurrent symptoms that may result in a chronic condition.  Problems of the lower back that are caused by compensating for the injury, such as limping.  Pain or weakness of the foot during push-off following surgery.  Chronic inflammation, scarring, and partial or complete fascia tear, occurring more often from repeated injections.  TREATMENT  Treatment initially involves the  use of ice and medication to help reduce pain and inflammation. The use of strengthening and stretching exercises may help reduce pain with activity, especially stretches of the Achilles tendon. These exercises may be performed at home or with a therapist. Your caregiver may recommend that you use heel cups of arch supports to help reduce stress on the plantar fascia. Occasionally, corticosteroid injections are given to reduce inflammation. If symptoms persist for greater than 6 months despite non-surgical (conservative), then surgery may be recommended.   MEDICATION   If pain medication is necessary, then nonsteroidal anti-inflammatory medications, such as aspirin and ibuprofen, or other minor pain relievers, such as acetaminophen, are often recommended.  Do not take pain medication within 7 days before surgery.  Prescription pain relievers may be given if deemed necessary by your caregiver. Use only as directed and only as much as you need.  Corticosteroid injections may be given by your caregiver. These injections should be reserved for the most serious cases, because they may only be given a certain number of times.  HEAT AND COLD  Cold treatment (icing) relieves pain and reduces inflammation. Cold treatment should be applied for 10 to 15 minutes every 2 to 3 hours for inflammation and pain and immediately after any activity that aggravates your symptoms. Use ice packs or massage the area with a piece of ice (ice massage).  Heat treatment may be used prior to performing the stretching and strengthening activities prescribed by your caregiver, physical therapist, or athletic trainer. Use a heat pack or soak the injury in warm water.  SEEK IMMEDIATE MEDICAL CARE IF:  Treatment seems to offer no benefit, or the condition worsens.  Any medications   produce adverse side effects.  EXERCISES- RANGE OF MOTION (ROM) AND STRETCHING EXERCISES - Plantar Fasciitis (Heel Spur Syndrome) These exercises  may help you when beginning to rehabilitate your injury. Your symptoms may resolve with or without further involvement from your physician, physical therapist or athletic trainer. While completing these exercises, remember:   Restoring tissue flexibility helps normal motion to return to the joints. This allows healthier, less painful movement and activity.  An effective stretch should be held for at least 30 seconds.  A stretch should never be painful. You should only feel a gentle lengthening or release in the stretched tissue.  RANGE OF MOTION - Toe Extension, Flexion  Sit with your right / left leg crossed over your opposite knee.  Grasp your toes and gently pull them back toward the top of your foot. You should feel a stretch on the bottom of your toes and/or foot.  Hold this stretch for 10 seconds.  Now, gently pull your toes toward the bottom of your foot. You should feel a stretch on the top of your toes and or foot.  Hold this stretch for 10 seconds. Repeat  times. Complete this stretch 3 times per day.   RANGE OF MOTION - Ankle Dorsiflexion, Active Assisted  Remove shoes and sit on a chair that is preferably not on a carpeted surface.  Place right / left foot under knee. Extend your opposite leg for support.  Keeping your heel down, slide your right / left foot back toward the chair until you feel a stretch at your ankle or calf. If you do not feel a stretch, slide your bottom forward to the edge of the chair, while still keeping your heel down.  Hold this stretch for 10 seconds. Repeat 3 times. Complete this stretch 2 times per day.   STRETCH  Gastroc, Standing  Place hands on wall.  Extend right / left leg, keeping the front knee somewhat bent.  Slightly point your toes inward on your back foot.  Keeping your right / left heel on the floor and your knee straight, shift your weight toward the wall, not allowing your back to arch.  You should feel a gentle stretch  in the right / left calf. Hold this position for 10 seconds. Repeat 3 times. Complete this stretch 2 times per day.  STRETCH  Soleus, Standing  Place hands on wall.  Extend right / left leg, keeping the other knee somewhat bent.  Slightly point your toes inward on your back foot.  Keep your right / left heel on the floor, bend your back knee, and slightly shift your weight over the back leg so that you feel a gentle stretch deep in your back calf.  Hold this position for 10 seconds. Repeat 3 times. Complete this stretch 2 times per day.  STRETCH  Gastrocsoleus, Standing  Note: This exercise can place a lot of stress on your foot and ankle. Please complete this exercise only if specifically instructed by your caregiver.   Place the ball of your right / left foot on a step, keeping your other foot firmly on the same step.  Hold on to the wall or a rail for balance.  Slowly lift your other foot, allowing your body weight to press your heel down over the edge of the step.  You should feel a stretch in your right / left calf.  Hold this position for 10 seconds.  Repeat this exercise with a slight bend in your right /   left knee. Repeat 3 times. Complete this stretch 2 times per day.   STRENGTHENING EXERCISES - Plantar Fasciitis (Heel Spur Syndrome)  These exercises may help you when beginning to rehabilitate your injury. They may resolve your symptoms with or without further involvement from your physician, physical therapist or athletic trainer. While completing these exercises, remember:   Muscles can gain both the endurance and the strength needed for everyday activities through controlled exercises.  Complete these exercises as instructed by your physician, physical therapist or athletic trainer. Progress the resistance and repetitions only as guided.  STRENGTH - Towel Curls  Sit in a chair positioned on a non-carpeted surface.  Place your foot on a towel, keeping your heel  on the floor.  Pull the towel toward your heel by only curling your toes. Keep your heel on the floor. Repeat 3 times. Complete this exercise 2 times per day.  STRENGTH - Ankle Inversion  Secure one end of a rubber exercise band/tubing to a fixed object (table, pole). Loop the other end around your foot just before your toes.  Place your fists between your knees. This will focus your strengthening at your ankle.  Slowly, pull your big toe up and in, making sure the band/tubing is positioned to resist the entire motion.  Hold this position for 10 seconds.  Have your muscles resist the band/tubing as it slowly pulls your foot back to the starting position. Repeat 3 times. Complete this exercises 2 times per day.  Document Released: 07/25/2005 Document Revised: 10/17/2011 Document Reviewed: 11/06/2008 [employer] Patient Information 2014 [employer].  

## 2016-04-21 NOTE — Progress Notes (Signed)
   Subjective:    Patient ID: [name], male    DOB: 30-Jul-1976, 40 y.o.   MRN: [id number]  HPI    Review of Systems  Constitutional: Positive for appetite change and fatigue.  Musculoskeletal: Positive for back pain.  Allergic/Immunologic: Positive for environmental allergies.       Objective:   Physical Exam        Assessment & Plan:

## 2016-05-05 ENCOUNTER — Ambulatory Visit (INDEPENDENT_AMBULATORY_CARE_PROVIDER_SITE_OTHER): Payer: Managed Care, Other (non HMO) | Admitting: Podiatry

## 2016-05-05 ENCOUNTER — Encounter: Payer: Self-pay | Admitting: Podiatry

## 2016-05-05 DIAGNOSIS — M722 Plantar fascial fibromatosis: Secondary | ICD-10-CM | POA: Diagnosis not present

## 2016-05-06 NOTE — Progress Notes (Signed)
Subjective:     Patient ID: [name], male   DOB: 1976-06-10, 40 y.o.   MRN: [id number]  HPI patient states his heel is improving quite a bit with only mild discomfort noted with inflammation fluid upon deep palpation   Review of Systems     Objective:   Physical Exam Neurovascular status intact with patient noted to have discomfort of a mild nature left plantar fashion    Assessment:     Improved plantar fasciitis left    Plan:     Advised on physical therapy anti-inflammatories supportive shoes and patient's discharged and less she needs to be seen again

## 2016-07-06 ENCOUNTER — Ambulatory Visit: Payer: Managed Care, Other (non HMO) | Admitting: Allergy and Immunology

## 2016-08-08 DIAGNOSIS — K859 Acute pancreatitis without necrosis or infection, unspecified: Secondary | ICD-10-CM

## 2016-08-08 HISTORY — DX: Acute pancreatitis without necrosis or infection, unspecified: K85.90

## 2016-09-27 ENCOUNTER — Other Ambulatory Visit: Payer: Self-pay | Admitting: Allergy and Immunology

## 2016-10-05 ENCOUNTER — Encounter: Payer: Self-pay | Admitting: Podiatry

## 2016-10-05 ENCOUNTER — Ambulatory Visit (INDEPENDENT_AMBULATORY_CARE_PROVIDER_SITE_OTHER): Payer: Managed Care, Other (non HMO) | Admitting: Podiatry

## 2016-10-05 DIAGNOSIS — M722 Plantar fascial fibromatosis: Secondary | ICD-10-CM | POA: Diagnosis not present

## 2016-10-05 MED ORDER — TRIAMCINOLONE ACETONIDE 10 MG/ML IJ SUSP
10.0000 mg | Freq: Once | INTRAMUSCULAR | Status: AC
  Administered 2016-10-05: 10 mg

## 2016-10-05 NOTE — Progress Notes (Signed)
Subjective:     Patient ID: [name], male   DOB: [birth year], 41 y.o.   MRN: [id number]  HPI patient states that he had relief for around 6 weeks with significant reoccurrence and is had problems been able to work comfortably   Review of Systems     Objective:   Physical Exam Neurovascular status intact with patient noted to have discomfort in the plantar heel region right with inflammation fluid with obesity is complicating factor    Assessment:     Acute plantar fasciitis with significant reoccurrence    Plan:     Reinjected the fascia 3 mg Kenalog 5 mg Xylocaine and went ahead and scanned for custom orthotics to reduce all plantar pressure

## 2016-10-26 ENCOUNTER — Ambulatory Visit: Payer: Managed Care, Other (non HMO)

## 2016-11-01 ENCOUNTER — Other Ambulatory Visit: Payer: Managed Care, Other (non HMO)

## 2016-12-06 ENCOUNTER — Ambulatory Visit (INDEPENDENT_AMBULATORY_CARE_PROVIDER_SITE_OTHER): Payer: Managed Care, Other (non HMO) | Admitting: Allergy and Immunology

## 2016-12-06 ENCOUNTER — Encounter: Payer: Self-pay | Admitting: Allergy and Immunology

## 2016-12-06 VITALS — BP 170/84 | HR 80 | Resp 18

## 2016-12-06 DIAGNOSIS — J4541 Moderate persistent asthma with (acute) exacerbation: Secondary | ICD-10-CM | POA: Diagnosis not present

## 2016-12-06 DIAGNOSIS — J3089 Other allergic rhinitis: Secondary | ICD-10-CM | POA: Diagnosis not present

## 2016-12-06 DIAGNOSIS — K219 Gastro-esophageal reflux disease without esophagitis: Secondary | ICD-10-CM

## 2016-12-06 MED ORDER — METHYLPREDNISOLONE ACETATE 80 MG/ML IJ SUSP
80.0000 mg | Freq: Once | INTRAMUSCULAR | Status: AC
  Administered 2016-12-06: 80 mg via INTRAMUSCULAR

## 2016-12-06 MED ORDER — BUDESONIDE-FORMOTEROL FUMARATE 160-4.5 MCG/ACT IN AERO: INHALATION_SPRAY | RESPIRATORY_TRACT | 5 refills | Status: DC

## 2016-12-06 MED ORDER — ALBUTEROL SULFATE 108 (90 BASE) MCG/ACT IN AEPB: 2.0000 | INHALATION_SPRAY | RESPIRATORY_TRACT | 1 refills | Status: DC | PRN

## 2016-12-06 NOTE — Patient Instructions (Addendum)
  1. Depo-Medrol 80 IM delivered in clinic  2. Symbicort 160 - two inhalations twice a day. Zero Coca Cola.  3. OTC Rhinocort / Nasacort one spray each nostril one time per day.    4. Continue over-the-counter omeprazole 20 mg one-2 times a day  5. Zyrtec and pro-air respiclick or similar if needed  6. Return in 6 months or earlier if problem

## 2016-12-06 NOTE — Progress Notes (Signed)
Follow-up Note  Referring Provider: Tamsen Roers, MD Primary Provider: Tamsen Roers, MD Date of Office Visit: 12/06/2016  Subjective:   [name] (DOB: [birth year]) is a 41 y.o. male who returns to the [facility] on 12/06/2016 in re-evaluation of the following:  HPI: [name] presents to this clinic in reevaluation of his asthma and allergic rhinoconjunctivitis and history of reflux. I last saw him in this clinic May 2017.  He was doing quite well while consistently using his controller agents for his atopic respiratory disease and therapy directed against reflux up until 3 weeks ago. At that point in time he developed nasal congestion and sneezing and coughing and wheezing and has been using his bronchodilator at least twice a day. Unfortunately, his insurance company would not refill his Symbicort and he's been off his controller agent for the past 5 weeks or so. He has also had some itchy throat but no problem swallowing and no issues with fever.  His reflux has been intermittently active while using omeprazole 20 mg one time per day. He still continues to eat some spicy food and drink caffeine.  Allergies as of 12/06/2016   No Known Allergies     Medication List      budesonide-formoterol 160-4.5 MCG/ACT inhaler Commonly known as:  SYMBICORT Inhale two puffs twice daily to prevent cough or wheeze. Rinse, gargle, and spit after use.   cetirizine 10 MG tablet Commonly known as:  ZYRTEC Take 10 mg by mouth daily.   omeprazole 20 MG capsule Commonly known as:  PRILOSEC Take 20 mg by mouth daily.   PROAIR RESPICLICK 197 (90 Base) MCG/ACT Aepb Generic drug:  Albuterol Sulfate INHALE 2 PUFF(S) EVERY 4-6 HOURS BY INHALATION ROUTE FOR 30 DAYS.   RHINOCORT ALLERGY 32 MCG/ACT nasal spray Generic drug:  budesonide Place 1 spray into both nostrils daily.       Past Medical History:  Diagnosis Date  . Asthma     Past Surgical History:  Procedure  Laterality Date  . BACK SURGERY      Review of systems negative except as noted in HPI / PMHx or noted below:  Review of Systems  Constitutional: Negative.   HENT: Negative.   Eyes: Negative.   Respiratory: Negative.   Cardiovascular: Negative.   Gastrointestinal: Negative.   Genitourinary: Negative.   Musculoskeletal: Negative.   Skin: Negative.   Neurological: Negative.   Endo/Heme/Allergies: Negative.   Psychiatric/Behavioral: Negative.      Objective:   Vitals:   12/06/16 1757  BP: (!) 170/84  Pulse: 80  Resp: 18          Physical Exam  Constitutional: He is well-developed, well-nourished, and in no distress.  HENT:  Head: Normocephalic.  Right Ear: Tympanic membrane, external ear and ear canal normal.  Left Ear: Tympanic membrane, external ear and ear canal normal.  Nose: Mucosal edema present. No rhinorrhea.  Mouth/Throat: Uvula is midline, oropharynx is clear and moist and mucous membranes are normal. No oropharyngeal exudate.  Eyes: Conjunctivae are normal.  Neck: Trachea normal. No tracheal tenderness present. No tracheal deviation present. No thyromegaly present.  Cardiovascular: Normal rate, regular rhythm, S1 normal, S2 normal and normal heart sounds.   No murmur heard. Pulmonary/Chest: No stridor. No respiratory distress. He has wheezes (end expiratory wheezes posterior lung fields). He has no rales.  Musculoskeletal: He exhibits no edema.  Lymphadenopathy:       Head (right side): No tonsillar adenopathy present.  Head (left side): No tonsillar adenopathy present.    He has no cervical adenopathy.  Neurological: He is alert. Gait normal.  Skin: No rash noted. He is not diaphoretic. No erythema. Nails show no clubbing.  Psychiatric: Mood and affect normal.    Diagnostics:    Spirometry was performed and demonstrated an FEV1 of 3.29 at 88 % of predicted.  Assessment and Plan:   1. Asthma, not well controlled, moderate persistent, with  acute exacerbation   2. Other allergic rhinitis   3. Gastroesophageal reflux disease, esophagitis presence not specified     1. Depo-Medrol 80 IM delivered in clinic  2. Symbicort 160 - two inhalations twice a day. Zero Coca Cola.  3. OTC Rhinocort / Nasacort one spray each nostril one time per day.    4. Continue over-the-counter omeprazole 20 mg one-2 times a day  5. Zyrtec and pro-air respiclick or similar if needed  6. Return in 6 months or earlier if problem                                                                                                                                                                                                            I suspect that [name] will do well with the therapy mentioned above. The big question is whether or not his insurance company will fill his Symbicort. If not we will need to get a substitute. He'll continue to use anti-inflammatory agents for both his upper lower airway and he will make a decision about his dose of omeprazole needed to control his reflux. I will see him back in this clinic in 6 months or earlier if there is a problem.  [name] Katz, MD [facility] [place]

## 2017-03-02 ENCOUNTER — Ambulatory Visit (INDEPENDENT_AMBULATORY_CARE_PROVIDER_SITE_OTHER): Payer: Managed Care, Other (non HMO) | Admitting: Podiatry

## 2017-03-02 ENCOUNTER — Ambulatory Visit (INDEPENDENT_AMBULATORY_CARE_PROVIDER_SITE_OTHER): Payer: Managed Care, Other (non HMO)

## 2017-03-02 DIAGNOSIS — M79672 Pain in left foot: Secondary | ICD-10-CM

## 2017-03-02 DIAGNOSIS — M779 Enthesopathy, unspecified: Secondary | ICD-10-CM | POA: Diagnosis not present

## 2017-03-02 DIAGNOSIS — M722 Plantar fascial fibromatosis: Secondary | ICD-10-CM | POA: Diagnosis not present

## 2017-03-02 MED ORDER — DICLOFENAC SODIUM 75 MG PO TBEC: 75.0000 mg | DELAYED_RELEASE_TABLET | Freq: Two times a day (BID) | ORAL | 2 refills | Status: DC

## 2017-03-03 NOTE — Progress Notes (Signed)
Subjective:    Patient ID: [name], male   DOB: 41 y.o.   MRN: [id number]   HPI patient states he had pain in his left foot which is improved but there still is discomfort with prolonged walking    ROS      Objective:  Physical Exam Neurovascular status intact with inflammation still noted but improved significantly left plantar heel    Assessment:   Plantar fasciitis improved but present left      Plan:    Advised on shoe gear changes anti-inflammatories physical therapy and support and patient will be seen back on an as-needed basis

## 2017-04-17 ENCOUNTER — Emergency Department (HOSPITAL_COMMUNITY): Payer: Managed Care, Other (non HMO)

## 2017-04-17 ENCOUNTER — Encounter (HOSPITAL_COMMUNITY): Payer: Self-pay | Admitting: Family Medicine

## 2017-04-17 ENCOUNTER — Inpatient Hospital Stay (HOSPITAL_COMMUNITY)
Admission: EM | Admit: 2017-04-17 | Discharge: 2017-04-21 | DRG: 439 | Disposition: A | Discharge status: Home / Self Care | Payer: Managed Care, Other (non HMO) | Attending: Nephrology | Admitting: Nephrology

## 2017-04-17 DIAGNOSIS — R1013 Epigastric pain: Secondary | ICD-10-CM

## 2017-04-17 DIAGNOSIS — Z825 Family history of asthma and other chronic lower respiratory diseases: Secondary | ICD-10-CM

## 2017-04-17 DIAGNOSIS — Z6841 Body Mass Index (BMI) 40.0 and over, adult: Secondary | ICD-10-CM

## 2017-04-17 DIAGNOSIS — K219 Gastro-esophageal reflux disease without esophagitis: Secondary | ICD-10-CM | POA: Diagnosis present

## 2017-04-17 DIAGNOSIS — I10 Essential (primary) hypertension: Secondary | ICD-10-CM

## 2017-04-17 DIAGNOSIS — Z833 Family history of diabetes mellitus: Secondary | ICD-10-CM

## 2017-04-17 DIAGNOSIS — Z8249 Family history of ischemic heart disease and other diseases of the circulatory system: Secondary | ICD-10-CM

## 2017-04-17 DIAGNOSIS — K859 Acute pancreatitis without necrosis or infection, unspecified: Principal | ICD-10-CM | POA: Diagnosis present

## 2017-04-17 DIAGNOSIS — J4540 Moderate persistent asthma, uncomplicated: Secondary | ICD-10-CM | POA: Diagnosis not present

## 2017-04-17 DIAGNOSIS — Z7951 Long term (current) use of inhaled steroids: Secondary | ICD-10-CM

## 2017-04-17 DIAGNOSIS — Z79899 Other long term (current) drug therapy: Secondary | ICD-10-CM

## 2017-04-17 HISTORY — DX: Essential (primary) hypertension: I10

## 2017-04-17 LAB — COMPREHENSIVE METABOLIC PANEL
ALT: 20 U/L (ref 17–63)
AST: 21 U/L (ref 15–41)
Albumin: 3.9 g/dL (ref 3.5–5.0)
Alkaline Phosphatase: 73 U/L (ref 38–126)
Anion gap: 10 (ref 5–15)
BUN: 13 mg/dL (ref 6–20)
CO2: 26 mmol/L (ref 22–32)
Calcium: 9.2 mg/dL (ref 8.9–10.3)
Chloride: 100 mmol/L — ABNORMAL LOW (ref 101–111)
Creatinine, Ser: 0.85 mg/dL (ref 0.61–1.24)
GFR calc Af Amer: >60 mL/min (ref >60)
GFR calc non Af Amer: >60 mL/min (ref >60)
Glucose, Bld: 116 mg/dL — ABNORMAL HIGH (ref 65–99)
Potassium: 3.8 mmol/L (ref 3.5–5.1)
Sodium: 136 mmol/L (ref 135–145)
Total Bilirubin: 1.2 mg/dL (ref 0.3–1.2)
Total Protein: 7.5 g/dL (ref 6.5–8.1)

## 2017-04-17 LAB — CBC
HCT: 45.4 % (ref 39.0–52.0)
Hemoglobin: 15.3 g/dL (ref 13.0–17.0)
MCH: 28.4 pg (ref 26.0–34.0)
MCHC: 33.7 g/dL (ref 30.0–36.0)
MCV: 84.4 fL (ref 78.0–100.0)
Platelets: 151 10*3/uL (ref 150–400)
RBC: 5.38 MIL/uL (ref 4.22–5.81)
RDW: 12.8 % (ref 11.5–15.5)
WBC: 15.3 10*3/uL — ABNORMAL HIGH (ref 4.0–10.5)

## 2017-04-17 LAB — URINALYSIS, ROUTINE W REFLEX MICROSCOPIC
Bacteria, UA: NONE SEEN
Bilirubin Urine: NEGATIVE
Glucose, UA: NEGATIVE mg/dL
Hgb urine dipstick: NEGATIVE
Ketones, ur: 5 mg/dL — AB
Leukocytes, UA: NEGATIVE
Nitrite: NEGATIVE
Protein, ur: 100 mg/dL — AB
Specific Gravity, Urine: 1.023 (ref 1.005–1.030)
Squamous Epithelial / LPF: NONE SEEN
pH: 8 (ref 5.0–8.0)

## 2017-04-17 LAB — LIPASE, BLOOD: Lipase: 103 U/L — ABNORMAL HIGH (ref 11–51)

## 2017-04-17 MED ORDER — GI COCKTAIL ~~LOC~~
30.0000 mL | Freq: Once | ORAL | Status: AC
  Administered 2017-04-17: 30 mL via ORAL
  Filled 2017-04-17: qty 30

## 2017-04-17 MED ORDER — ONDANSETRON HCL 4 MG/2ML IJ SOLN
4.0000 mg | Freq: Four times a day (QID) | INTRAMUSCULAR | Status: DC | PRN
  Administered 2017-04-17 – 2017-04-19 (×3): 4 mg via INTRAVENOUS
  Filled 2017-04-17 (×4): qty 2

## 2017-04-17 MED ORDER — ACETAMINOPHEN 325 MG PO TABS: 650.0000 mg | ORAL_TABLET | Freq: Once | ORAL | Status: DC

## 2017-04-17 MED ORDER — OXYCODONE-ACETAMINOPHEN 5-325 MG PO TABS
ORAL_TABLET | ORAL | Status: AC
  Filled 2017-04-17: qty 1

## 2017-04-17 MED ORDER — ALUM & MAG HYDROXIDE-SIMETH 200-200-20 MG/5ML PO SUSP: 15.0000 mL | Freq: Once | ORAL | Status: DC

## 2017-04-17 MED ORDER — SODIUM CHLORIDE 0.9 % IV BOLUS (SEPSIS)
500.0000 mL | Freq: Once | INTRAVENOUS | Status: AC
  Administered 2017-04-17: 500 mL via INTRAVENOUS

## 2017-04-17 MED ORDER — ONDANSETRON HCL 4 MG PO TABS
4.0000 mg | ORAL_TABLET | Freq: Four times a day (QID) | ORAL | Status: DC | PRN
  Administered 2017-04-19 – 2017-04-20 (×3): 4 mg via ORAL
  Filled 2017-04-17 (×3): qty 1

## 2017-04-17 MED ORDER — ONDANSETRON 4 MG PO TBDP
ORAL_TABLET | ORAL | Status: AC
  Filled 2017-04-17: qty 1

## 2017-04-17 MED ORDER — OXYCODONE-ACETAMINOPHEN 5-325 MG PO TABS
1.0000 | ORAL_TABLET | ORAL | Status: DC | PRN
  Administered 2017-04-17: 1 via ORAL

## 2017-04-17 MED ORDER — HYDROMORPHONE HCL 1 MG/ML IJ SOLN
1.0000 mg | INTRAMUSCULAR | Status: DC | PRN
  Administered 2017-04-17 – 2017-04-19 (×9): 1 mg via INTRAVENOUS
  Filled 2017-04-17 (×9): qty 1

## 2017-04-17 MED ORDER — LACTATED RINGERS IV BOLUS (SEPSIS)
1000.0000 mL | Freq: Once | INTRAVENOUS | Status: AC
  Administered 2017-04-17: 1000 mL via INTRAVENOUS

## 2017-04-17 MED ORDER — LIDOCAINE VISCOUS 2 % MT SOLN: 15.0000 mL | Freq: Once | OROMUCOSAL | Status: DC

## 2017-04-17 MED ORDER — SODIUM CHLORIDE 0.9 % IV SOLN
INTRAVENOUS | Status: AC
  Administered 2017-04-18 (×3): via INTRAVENOUS

## 2017-04-17 MED ORDER — ONDANSETRON 4 MG PO TBDP
4.0000 mg | ORAL_TABLET | Freq: Once | ORAL | Status: AC | PRN
  Administered 2017-04-17: 4 mg via ORAL

## 2017-04-17 NOTE — ED Triage Notes (Signed)
Pt reports 2 days of upper abdominal pain. States seen at [facility] was told maybe haital hernia or gallstones. States no improvement with prescribed medications. Pt states pain is worse after eating. Pt reports nausea but no vomiting. Denies fever. REports his lower back hurting as well.

## 2017-04-17 NOTE — ED Provider Notes (Signed)
[facility] DEPT Provider Note   CSN: [id number] Arrival date & time: 04/17/17  1217     History   Chief Complaint Chief Complaint  Patient presents with  . Abdominal Pain    HPI [name] is a 41 y.o. male.  This is a 41 year old male with PMH of asthma, esophageal reflux disease, obesity who presents with abdominal pain for the past 3 days.  He endorses one episode of vomiting which happened today which was nonbloody, nonbilious.  He states his last bowel movement was 3 days ago otherwise they have been nonbloody and nonpainful.  Denies any urinary symptoms including dysuria, hematuria, hesitancy.  He states his abdominal pain is located in the epigastrium and he states it radiates to his back.  Denies any rashes, chest pain, dyspnea, blurry vision, numbness and tingling in extremities.  He states he takes naproxen and ibuprofen daily for as long as he can remember for his back pain.  He denies any prior abdominal surgeries.  Denies any prior endoscopy or ulcer disease.  Denies any prior history of kidney stones or family history of kidney stones.  Denies any prior history of gallstones.  He denies alcohol use. While in triage he had his one episode of vomiting and was given Zofran which has resolved his nausea.    The history is provided by the patient.    Past Medical History:  Diagnosis Date  . Asthma     Patient Active Problem List   Diagnosis Date Noted  . Pancreatitis 04/17/2017  . Moderate persistent asthma 07/21/2015  . Allergic rhinoconjunctivitis 07/21/2015  . Esophageal reflux 07/21/2015  . ABNORMALITY OF GAIT 11/12/2009  . TARSAL TUNNEL SYNDROME, LEFT 10/08/2009  . PES PLANUS 10/08/2009    Past Surgical History:  Procedure Laterality Date  . BACK SURGERY         Home Medications    Prior to Admission medications   Medication Sig Start Date End Date Taking? Authorizing Provider  Albuterol Sulfate (PROAIR RESPICLICK) 409 (90 Base) MCG/ACT AEPB  Inhale 2 puffs into the lungs every 4 (four) hours as needed. 12/06/16   Kozlow, Donnamarie Poag, MD  budesonide (RHINOCORT ALLERGY) 32 MCG/ACT nasal spray Place 1 spray into both nostrils daily.    [provider]  budesonide-formoterol (SYMBICORT) 160-4.5 MCG/ACT inhaler Inhale two puffs twice daily to prevent cough or wheeze. Rinse, gargle, and spit after use. 12/06/16   Kozlow, Donnamarie Poag, MD  cetirizine (ZYRTEC) 10 MG tablet Take 10 mg by mouth daily.    [provider]  diclofenac (VOLTAREN) 75 MG EC tablet Take 1 tablet (75 mg total) by mouth 2 (two) times daily. 03/02/17   Wallene Huh, DPM  omeprazole (PRILOSEC) 20 MG capsule Take 20 mg by mouth daily.    [provider]    Family History Family History  Problem Relation Age of Onset  . Asthma Brother   . Diabetes Father   . Hypertension Father   . Diabetes Paternal Grandmother   . Hypertension Paternal Grandmother     Social History Social History  Substance Use Topics  . Smoking status: Never Smoker  . Smokeless tobacco: Never Used  . Alcohol use Not on file     Allergies   Patient has no known allergies.   Review of Systems Review of Systems  Constitutional: Negative for chills and fever.  HENT: Negative for ear pain and sore throat.   Eyes: Negative for pain and visual disturbance.  Respiratory: Negative for  cough and shortness of breath.   Cardiovascular: Negative for chest pain, palpitations and leg swelling.  Gastrointestinal: Positive for abdominal pain, constipation, nausea and vomiting. Negative for anal bleeding, blood in stool and diarrhea.  Genitourinary: Negative for difficulty urinating, dysuria, flank pain and hematuria.  Musculoskeletal: Negative for arthralgias, back pain, joint swelling and myalgias.  Skin: Negative for color change and rash.  Neurological: Negative for seizures and syncope.  All other systems reviewed and are negative.    Physical Exam Updated Vital Signs BP  (!) 158/90   Pulse 92   Temp 98.1 F (36.7 C) (Oral)   Resp 19   SpO2 98%   Physical Exam  Constitutional: He appears well-developed and well-nourished.  HENT:  Head: Normocephalic and atraumatic.  Eyes: Pupils are equal, round, and reactive to light. Conjunctivae are normal.  Neck: Neck supple.  Cardiovascular: Normal rate and regular rhythm.   No murmur heard. Pulmonary/Chest: Effort normal and breath sounds normal. No respiratory distress.  Abdominal: Soft. There is tenderness in the epigastric area. There is no rigidity, no guarding and negative Murphy's sign.  Musculoskeletal: He exhibits no edema.       Thoracic back: He exhibits tenderness.  Midthoracic back tenderness, otherwise no midline bony tenderness.  Neurological: He is alert.  Skin: Skin is warm and dry.  Psychiatric: He has a normal mood and affect.  Nursing note and vitals reviewed.    ED Treatments / Results  Labs (all labs ordered are listed, but only abnormal results are displayed) Labs Reviewed  LIPASE, BLOOD - Abnormal; Notable for the following:       Result Value   Lipase 103 (*)    All other components within normal limits  COMPREHENSIVE METABOLIC PANEL - Abnormal; Notable for the following:    Chloride 100 (*)    Glucose, Bld 116 (*)    All other components within normal limits  CBC - Abnormal; Notable for the following:    WBC 15.3 (*)    All other components within normal limits  URINALYSIS, ROUTINE W REFLEX MICROSCOPIC - Abnormal; Notable for the following:    Ketones, ur 5 (*)    Protein, ur 100 (*)    All other components within normal limits    EKG  EKG Interpretation None       Radiology US Abdomen Limited  Result Date: 04/17/2017 EXAM: ULTRASOUND ABDOMEN LIMITED RIGHT UPPER QUADRANT COMPARISON:  None. FINDINGS: Gallbladder: No gallstones or wall thickening visualized. No pericholecystic fluid is identified. Layering biliary sludge is noted. No sonographic Murphy sign noted  by sonographer. Common bile duct: Diameter: 3.9 mm and within normal limits. Liver: No focal lesion identified. The liver is increased in echogenicity consistent with hepatic steatosis. Portal vein is patent on color Doppler imaging with normal direction of blood flow towards the liver. IMPRESSION: 1. Fatty appearing liver. 2. Biliary sludge without secondary signs of acute cholecystitis. Electronically Signed   By: Ashley Royalty M.D.   On: 04/17/2017 22:14    Procedures Procedures (including critical care time)  Medications Ordered in ED Medications  oxyCODONE-acetaminophen (PERCOCET/ROXICET) 5-325 MG per tablet 1 tablet (1 tablet Oral Given 04/17/17 1302)  gi cocktail (Maalox,Lidocaine,Donnatal) (not administered)  sodium chloride 0.9 % bolus 500 mL (not administered)  0.9 %  sodium chloride infusion (not administered)  ondansetron (ZOFRAN-ODT) disintegrating tablet 4 mg (4 mg Oral Given 04/17/17 1837)  lactated ringers bolus 1,000 mL (0 mLs Intravenous Stopped 04/17/17 2253)     Initial Impression /  Assessment and Plan / ED Course  I have reviewed the triage vital signs and the nursing notes.  Pertinent labs & imaging results that were available during my care of the patient were reviewed by me and considered in my medical decision making (see chart for details).     This is a 41 year old male with PMH of asthma, esophageal reflux disease, obesity who presents with abdominal pain for the past 3 days.    On exam patient has epigastric tenderness.  No Murphy sign no rigidity or guarding.  Patient given Zofran in triage, patient states he is no longer nauseous since then. Lidocaine/Maalox given for epigastric pain for possibly gastric related pain.  Lipase mildly elevated at 103.  No hyperbilirubinemia, alkaline phosphatase and AST/ALT normal.  No gross electrolyte and normalities.  Urine studies unremarkable.  Clinical picture concerning for developing pancreatitis.  Ultrasound of the  right upper quadrant ordered to evaluate for biliary pathology. Patient given IV fluids.  Ultrasound shows evidence of biliary sludge, no obvious gallstone pathology.  Given patient's lack of appetite, lack of p.o. intake, elevated lipase and continued nausea and epigastric pain, will admit for suspected developing pancreatitis with IV hydration and further monitoring.  Spoke with hospitalist for admission and in agreement.  Spoke with family at bedside and the patient, all questions answered and differential discussed.  Final Clinical Impressions(s) / ED Diagnoses   Final diagnoses:  Acute pancreatitis, unspecified complication status, unspecified pancreatitis type  Epigastric pain    New Prescriptions New Prescriptions   No medications on file     Aldona Lento, MD 04/17/17 2324    Charlesetta Shanks, MD 04/20/17 [phone]

## 2017-04-17 NOTE — H&P (Signed)
History and Physical  Patient Name: [name]     MLY:[id number]    DOB: [birth year]    DOA: 04/17/2017 PCP: Tamsen Roers, MD  Patient coming from: Home  Chief Complaint: Epigastric pain      HPI: [name] is a 41 y.o. male with a past medical history significant for asthma moderate persistent, HTN and obesity who presents with epigastric pain.  The patient was in his usual state of health until Saturday night, he is with a large [place] dinner, and afterwards he felt bloating, retched, and some epigastric pain developed. Shortly after that he had a chocolate milkshake, and from there his epigastric pain worsened and became constant.  Since then he has had constant severe epigastric pain radiating to the back.  Couldn't eat much on Sunday, went to UC where they did a CXR and AXR that were normal and sent him home with conservative measures and a new BP medicine (lisinopril 10) that he took once yesterday.  Then this morning his pain persisted, so he came to the ER.    He has had no previous episodes of epigastric pain, or gallstone disease.  Drinks "maybe twice a year" none recently.    ED course: -Afebrile, heart rate 81, respirations pulse ox normal, blood pressure 140/85 -Na 136, K 3.8, Cr 0.8, WBC 15.3K, Hgb 15.3 -Lipase 103 -Urinalysis unremarkable -Ultrasound of the abdomen showed no cholecystitis, no cold or thickening, no sonographic Murphy's, no dilated CBD, only some gallbladder sludge -She was given Zofran, oxycodone, 1 L lactated Ringer's, and TRH were asked to evaluate for pancreatitis         ROS: Review of Systems  Constitutional: Negative for chills, fever and malaise/fatigue.  Gastrointestinal: Positive for abdominal pain, heartburn, nausea and vomiting. Negative for blood in stool, constipation, diarrhea and melena.  All other systems reviewed and are negative.         Past Medical History:  Diagnosis Date  . Asthma     Past Surgical History:    Procedure Laterality Date  . BACK SURGERY      Social History: Patient lives with his wife and child.  The patient walks unassisted.  Nonsmoker.  Rare alcohol.  From [place].  Went to [employer].  Now works at [employer].  No Known Allergies  Family history: family history includes Asthma in his brother; Diabetes in his father and paternal grandmother; Hypertension in his father and paternal grandmother.  Prior to Admission medications   Medication Sig Start Date End Date Taking? Authorizing Provider  Albuterol Sulfate (PROAIR RESPICLICK) 812 (90 Base) MCG/ACT AEPB Inhale 2 puffs into the lungs every 4 (four) hours as needed. 12/06/16   Kozlow, Donnamarie Poag, MD  budesonide (RHINOCORT ALLERGY) 32 MCG/ACT nasal spray Place 1 spray into both nostrils daily.    [provider]  budesonide-formoterol (SYMBICORT) 160-4.5 MCG/ACT inhaler Inhale two puffs twice daily to prevent cough or wheeze. Rinse, gargle, and spit after use. 12/06/16   Kozlow, Donnamarie Poag, MD  cetirizine (ZYRTEC) 10 MG tablet Take 10 mg by mouth daily.    [provider]  diclofenac (VOLTAREN) 75 MG EC tablet Take 1 tablet (75 mg total) by mouth 2 (two) times daily. 03/02/17   Wallene Huh, DPM  omeprazole (PRILOSEC) 20 MG capsule Take 20 mg by mouth daily.    [provider]       Physical Exam: BP (!) 158/90   Pulse 92  Temp 98.1 F (36.7 C) (Oral)   Resp 19   SpO2 98%  General appearance: Well-developed, obese adult male, alert and in mild distress from pain.   Eyes: Anicteric, conjunctiva pink, lids and lashes normal. PERRL.    ENT: No nasal deformity, discharge, epistaxis.  Hearing normal. OP moist without lesions.  Mallampati 4.  Neck: No neck masses.  Trachea midline.  No thyromegaly/tenderness. Lymph: No cervical or supraclavicular lymphadenopathy. Skin: Warm and dry.  No jaundice.  No suspicious rashes or lesions. Cardiac: RRR, nl S1-S2, no murmurs  appreciated.  Capillary refill is brisk.  JVP not visible.  Trace bilateral LE edema.  Radial pulses 2+ and symmetric. Respiratory: Normal respiratory rate and rhythm.  CTAB without rales or wheezes. Abdomen: Abdomen soft.  Moderate TTP in epigastrium without guarding. No ascites, distension, hepatosplenomegaly.   MSK: No deformities or effusions.  No cyanosis or clubbing. Neuro: Cranial nerves normal.  Sensation intact to light touch. Speech is fluent.  Muscle strength normal.    Psych: Sensorium intact and responding to questions, attention normal.  Behavior appropriate.  Affect normal.  Judgment and insight appear normal.     Labs on Admission:  I have personally reviewed following labs and imaging studies: CBC:  Recent Labs Lab 04/17/17 1313  WBC 15.3*  HGB 15.3  HCT 45.4  MCV 84.4  PLT 710   Basic Metabolic Panel:  Recent Labs Lab 04/17/17 1313  NA 136  K 3.8  CL 100*  CO2 26  GLUCOSE 116*  BUN 13  CREATININE 0.85  CALCIUM 9.2   GFR: CrCl cannot be calculated (Unknown ideal weight.).  Liver Function Tests:  Recent Labs Lab 04/17/17 1313  AST 21  ALT 20  ALKPHOS 73  BILITOT 1.2  PROT 7.5  ALBUMIN 3.9    Recent Labs Lab 04/17/17 1313  LIPASE 103*   No results for input(s): AMMONIA in the last 168 hours. Coagulation Profile: No results for input(s): INR, PROTIME in the last 168 hours. Cardiac Enzymes: No results for input(s): CKTOTAL, CKMB, CKMBINDEX, TROPONINI in the last 168 hours. BNP (last 3 results) No results for input(s): PROBNP in the last 8760 hours. HbA1C: No results for input(s): HGBA1C in the last 72 hours. CBG: No results for input(s): GLUCAP in the last 168 hours. Lipid Profile: No results for input(s): CHOL, HDL, LDLCALC, TRIG, CHOLHDL, LDLDIRECT in the last 72 hours. Thyroid Function Tests: No results for input(s): TSH, T4TOTAL, FREET4, T3FREE, THYROIDAB in the last 72 hours. Anemia Panel: No results for input(s): VITAMINB12,  FOLATE, FERRITIN, TIBC, IRON, RETICCTPCT in the last 72 hours. Sepsis Labs: Invalid input(s): PROCALCITONIN, LACTICIDVEN No results found for this or any previous visit (from the past 240 hour(s)).       Radiological Exams on Admission: Personally reviewed [place] report: US Abdomen Limited  Result Date: 04/17/2017 EXAM: ULTRASOUND ABDOMEN LIMITED RIGHT UPPER QUADRANT COMPARISON:  None. FINDINGS: Gallbladder: No gallstones or wall thickening visualized. No pericholecystic fluid is identified. Layering biliary sludge is noted. No sonographic Murphy sign noted by sonographer. Common bile duct: Diameter: 3.9 mm and within normal limits. Liver: No focal lesion identified. The liver is increased in echogenicity consistent with hepatic steatosis. Portal vein is patent on color Doppler imaging with normal direction of blood flow towards the liver. IMPRESSION: 1. Fatty appearing liver. 2. Biliary sludge without secondary signs of acute cholecystitis. Electronically Signed   By: Ashley Royalty M.D.   On: 04/17/2017 22:14        Assessment/Plan  1. Acute pancreatitis:  Gall sludge but no frank hepatitis or gallstones or dilated CBD.  No suggestion of alcohol related, or med induced.   -Check lipids -Trend LFTs -NPO and MIVF at 200 cc/hr overnight, can lower tomorrow -Ondansetron for nausea, dilaudid IV as needed for pain -Defer CT abdomen for now, clinically has pancreatitis clearly, can obtain CT if worsening or if preferred by Gen Surg -Will discuss with Gen Surg in AM regarding ?cholecystectomy if lipids normal   2. Hypertension:  Hypertensive at admission.  Just started on lisinopril yesterday.  Only has taken one dose. -Continue new lisinopril  3. Asthma:  Not clinically active -Continue ICS/LABA -Continue PPI, allergy meds      DVT prophylaxis: Lovenox  Code Status: FULL  Family Communication: None present  Disposition Plan: Anticipate conservative mgmt of pancreatitis. Overall mild  symptoms, if pain free and hungry tomorrow, could conceivably be ready for d/c tomorrow.  Otherwise (and more likely) will need further inpatietn care. Consults called: None overnight Admission status: OBS for now At the point of initial evaluation, it is my clinical opinion that admission for OBSERVATION is reasonable and necessary because the patient's presenting complaints in the context of their chronic conditions represent sufficient risk of deterioration or significant morbidity to constitute reasonable grounds for close observation in the hospital setting, but that the patient may be medically stable for discharge from the hospital within 24 to 48 hours.    Medical decision making: Patient seen at 11:05 PM on 04/17/2017.  The patient was discussed with Dr. Ladell Pier.  What exists of the patient's chart was reviewed in depth and summarized above.  Clinical condition: stable.        Edwin Dada [facility] Pager [phone]

## 2017-04-17 NOTE — ED Notes (Signed)
The patient ate potatoes while in waiting and immediately began vomiting. Zofran ODT given, instructed to remain NPO at this time. Apologized for wait time and updated pt on plan of care

## 2017-04-18 DIAGNOSIS — Z833 Family history of diabetes mellitus: Secondary | ICD-10-CM | POA: Diagnosis not present

## 2017-04-18 DIAGNOSIS — R1013 Epigastric pain: Secondary | ICD-10-CM

## 2017-04-18 DIAGNOSIS — K219 Gastro-esophageal reflux disease without esophagitis: Secondary | ICD-10-CM | POA: Diagnosis present

## 2017-04-18 DIAGNOSIS — Z79899 Other long term (current) drug therapy: Secondary | ICD-10-CM | POA: Diagnosis not present

## 2017-04-18 DIAGNOSIS — Z8249 Family history of ischemic heart disease and other diseases of the circulatory system: Secondary | ICD-10-CM | POA: Diagnosis not present

## 2017-04-18 DIAGNOSIS — K859 Acute pancreatitis without necrosis or infection, unspecified: Secondary | ICD-10-CM | POA: Diagnosis present

## 2017-04-18 DIAGNOSIS — J4540 Moderate persistent asthma, uncomplicated: Secondary | ICD-10-CM | POA: Diagnosis present

## 2017-04-18 DIAGNOSIS — Z6841 Body Mass Index (BMI) 40.0 and over, adult: Secondary | ICD-10-CM | POA: Diagnosis not present

## 2017-04-18 DIAGNOSIS — Z825 Family history of asthma and other chronic lower respiratory diseases: Secondary | ICD-10-CM | POA: Diagnosis not present

## 2017-04-18 DIAGNOSIS — Z7951 Long term (current) use of inhaled steroids: Secondary | ICD-10-CM | POA: Diagnosis not present

## 2017-04-18 DIAGNOSIS — I10 Essential (primary) hypertension: Secondary | ICD-10-CM | POA: Diagnosis present

## 2017-04-18 LAB — COMPREHENSIVE METABOLIC PANEL
ALT: 17 U/L (ref 17–63)
AST: 15 U/L (ref 15–41)
Albumin: 3.3 g/dL — ABNORMAL LOW (ref 3.5–5.0)
Alkaline Phosphatase: 58 U/L (ref 38–126)
Anion gap: 6 (ref 5–15)
BUN: 11 mg/dL (ref 6–20)
CO2: 28 mmol/L (ref 22–32)
Calcium: 8.6 mg/dL — ABNORMAL LOW (ref 8.9–10.3)
Chloride: 102 mmol/L (ref 101–111)
Creatinine, Ser: 0.83 mg/dL (ref 0.61–1.24)
GFR calc Af Amer: >60 mL/min (ref >60)
GFR calc non Af Amer: >60 mL/min (ref >60)
Glucose, Bld: 115 mg/dL — ABNORMAL HIGH (ref 65–99)
Potassium: 3.7 mmol/L (ref 3.5–5.1)
Sodium: 136 mmol/L (ref 135–145)
Total Bilirubin: 1 mg/dL (ref 0.3–1.2)
Total Protein: 6.4 g/dL — ABNORMAL LOW (ref 6.5–8.1)

## 2017-04-18 LAB — LIPID PANEL
Cholesterol: 148 mg/dL (ref 0–200)
HDL: 41 mg/dL (ref >40)
LDL Cholesterol: 95 mg/dL (ref 0–99)
Total CHOL/HDL Ratio: 3.6 RATIO
Triglycerides: 61 mg/dL (ref <150)
VLDL: 12 mg/dL (ref 0–40)

## 2017-04-18 LAB — CBC
HCT: 41.8 % (ref 39.0–52.0)
Hemoglobin: 13.4 g/dL (ref 13.0–17.0)
MCH: 27.5 pg (ref 26.0–34.0)
MCHC: 32.1 g/dL (ref 30.0–36.0)
MCV: 85.7 fL (ref 78.0–100.0)
Platelets: 136 10*3/uL — ABNORMAL LOW (ref 150–400)
RBC: 4.88 MIL/uL (ref 4.22–5.81)
RDW: 13.1 % (ref 11.5–15.5)
WBC: 14.6 10*3/uL — ABNORMAL HIGH (ref 4.0–10.5)

## 2017-04-18 LAB — HIV ANTIBODY (ROUTINE TESTING W REFLEX): HIV Screen 4th Generation wRfx: NONREACTIVE

## 2017-04-18 MED ORDER — FLUTICASONE FUROATE-VILANTEROL 200-25 MCG/INH IN AEPB
1.0000 | INHALATION_SPRAY | Freq: Every day | RESPIRATORY_TRACT | Status: DC
  Administered 2017-04-19 – 2017-04-21 (×3): 1 via RESPIRATORY_TRACT
  Filled 2017-04-18: qty 28

## 2017-04-18 MED ORDER — PANTOPRAZOLE SODIUM 40 MG PO TBEC
40.0000 mg | DELAYED_RELEASE_TABLET | Freq: Every day | ORAL | Status: DC
  Administered 2017-04-18 – 2017-04-19 (×2): 40 mg via ORAL
  Filled 2017-04-18 (×2): qty 1

## 2017-04-18 MED ORDER — IOPAMIDOL (ISOVUE-300) INJECTION 61%
INTRAVENOUS | Status: AC
  Administered 2017-04-19: 100 mL
  Filled 2017-04-18: qty 100

## 2017-04-18 MED ORDER — LISINOPRIL 10 MG PO TABS
10.0000 mg | ORAL_TABLET | Freq: Every day | ORAL | Status: DC
  Administered 2017-04-18 – 2017-04-19 (×2): 10 mg via ORAL
  Filled 2017-04-18 (×2): qty 1

## 2017-04-18 MED ORDER — ENOXAPARIN SODIUM 40 MG/0.4ML ~~LOC~~ SOLN
40.0000 mg | SUBCUTANEOUS | Status: DC
  Filled 2017-04-18: qty 0.4

## 2017-04-18 MED ORDER — OXYCODONE HCL 5 MG PO TABS
5.0000 mg | ORAL_TABLET | ORAL | Status: DC | PRN
  Administered 2017-04-18 – 2017-04-20 (×4): 5 mg via ORAL
  Filled 2017-04-18 (×4): qty 1

## 2017-04-18 MED ORDER — FLUTICASONE PROPIONATE 50 MCG/ACT NA SUSP
1.0000 | Freq: Every day | NASAL | Status: DC
  Administered 2017-04-19: 1 via NASAL
  Filled 2017-04-18: qty 16

## 2017-04-18 MED ORDER — IOPAMIDOL (ISOVUE-300) INJECTION 61%
INTRAVENOUS | Status: AC
  Filled 2017-04-18: qty 30

## 2017-04-18 MED ORDER — OXYCODONE HCL 5 MG PO TABS
10.0000 mg | ORAL_TABLET | ORAL | Status: DC | PRN
  Administered 2017-04-19 – 2017-04-20 (×2): 10 mg via ORAL
  Filled 2017-04-18 (×6): qty 2

## 2017-04-18 MED ORDER — LACTATED RINGERS IV SOLN
INTRAVENOUS | Status: DC
  Administered 2017-04-18 – 2017-04-20 (×7): via INTRAVENOUS

## 2017-04-18 NOTE — Progress Notes (Signed)
PROGRESS NOTE    [name]  BMW:[id number] DOB: [birth year] DOA: 04/17/2017 PCP: Tamsen Roers, MD    Brief Narrative:  41 y.o. male with Rayden Scheper past medical history significant for asthma moderate persistent, HTN and obesity who presents with epigastric pain.   Assessment & Plan:   Principal Problem:   Pancreatitis Active Problems:   Moderate persistent asthma   Essential hypertension   1. Acute pancreatitis:  Biliary sludge seen on [place] may be etiology.  No Etoh use, no normal lipids.  No related meds. - NPO currently, tolerating ice chips, will try clears this afternoon if able (as well as PO pain meds) -NPO, continue at 200 cc/hr for now -Ondansetron for nausea, dilaudid IV as needed for pain -Defer CT abdomen for now, clinically has pancreatitis clearly, can obtain CT if worsening or if preferred by Gen Surg -Will discuss with Gen Surg in AM regarding ?cholecystectomy if lipids normal  2. Hypertension:  Hypertensive at admission.  Just started on lisinopril yesterday.  Only has taken one dose. -Continue new lisinopril  3. Asthma:  Not clinically active -Continue ICS/LABA -Continue PPI, allergy meds   DVT prophylaxis: lovenox Code Status: full Family Communication: none present Disposition Plan: home pending improvement   Consultants:   surgery  Procedures: (Don't include imaging studies which can be auto populated. Include things that cannot be auto populated i.e. Echo, Carotid and venous dopplers, Foley, Bipap, HD, tubes/drains, wound vac, central lines etc)  RUQ [place]  Antimicrobials: (specify start and planned stop date. Auto populated tables are space occupying and do not give end dates)  none    Subjective: Feeling better with pain meds, pain comes back after Janiel Derhammer while. Nausea.  No vomiting overnight.  Passing gas.  No BM.   Objective: Vitals:   04/17/17 1948 04/17/17 2350 04/18/17 0043 04/18/17 0535  BP: (!) 158/90 (!) 152/81 (!) 147/70 135/71    Pulse: 92 86 84 78  Resp: 19 20 20 18   Temp:   99.1 F (37.3 C) 99.3 F (37.4 C)  TempSrc:   Oral Oral  SpO2: 98% 93% 99% 99%    Intake/Output Summary (Last 24 hours) at 04/18/17 0754 Last data filed at 04/18/17 2725  Gross per 24 hour  Intake             1220 ml  Output              260 ml  Net              960 ml   There were no vitals filed for this visit.  Examination:  General: No acute distress. Cardiovascular: Heart sounds show Adithi Gammon regular rate, and rhythm. No gallops or rubs. No murmurs. No JVD. Lungs: Clear to auscultation bilaterally with good air movement. No rales, rhonchi or wheezes. Abdomen: Soft, no rebound or guarding, but tender at epigastric region to palpation.  with normal active bowel sounds. No masses. No hepatosplenomegaly. Neurological: Alert and oriented 3. Moves all extremities 4 with equal strength. Cranial nerves II through XII grossly intact. Skin: Warm and dry. No rashes or lesions. Extremities: No clubbing or cyanosis. No edema. Pedal pulses 2+. Psychiatric: Mood and affect are normal. Insight and judgment are appropriate.    Data Reviewed: I have personally reviewed following labs and imaging studies  CBC:  Recent Labs Lab 04/17/17 1313 04/18/17 0336  WBC 15.3* 14.6*  HGB 15.3 13.4  HCT 45.4 41.8  MCV 84.4 85.7  PLT 151 136*  Basic Metabolic Panel:  Recent Labs Lab 04/17/17 1313 04/18/17 0336  NA 136 136  K 3.8 3.7  CL 100* 102  CO2 26 28  GLUCOSE 116* 115*  BUN 13 11  CREATININE 0.85 0.83  CALCIUM 9.2 8.6*   GFR: CrCl cannot be calculated (Unknown ideal weight.). Liver Function Tests:  Recent Labs Lab 04/17/17 1313 04/18/17 0336  AST 21 15  ALT 20 17  ALKPHOS 73 58  BILITOT 1.2 1.0  PROT 7.5 6.4*  ALBUMIN 3.9 3.3*    Recent Labs Lab 04/17/17 1313  LIPASE 103*   No results for input(s): AMMONIA in the last 168 hours. Coagulation Profile: No results for input(s): INR, PROTIME in the last 168  hours. Cardiac Enzymes: No results for input(s): CKTOTAL, CKMB, CKMBINDEX, TROPONINI in the last 168 hours. BNP (last 3 results) No results for input(s): PROBNP in the last 8760 hours. HbA1C: No results for input(s): HGBA1C in the last 72 hours. CBG: No results for input(s): GLUCAP in the last 168 hours. Lipid Profile:  Recent Labs  04/18/17 0336  CHOL 148  HDL 41  LDLCALC 95  TRIG 61  CHOLHDL 3.6   Thyroid Function Tests: No results for input(s): TSH, T4TOTAL, FREET4, T3FREE, THYROIDAB in the last 72 hours. Anemia Panel: No results for input(s): VITAMINB12, FOLATE, FERRITIN, TIBC, IRON, RETICCTPCT in the last 72 hours. Sepsis Labs: No results for input(s): PROCALCITON, LATICACIDVEN in the last 168 hours.  No results found for this or any previous visit (from the past 240 hour(s)).       Radiology Studies: US Abdomen Limited  Result Date: 04/17/2017 EXAM: ULTRASOUND ABDOMEN LIMITED RIGHT UPPER QUADRANT COMPARISON:  None. FINDINGS: Gallbladder: No gallstones or wall thickening visualized. No pericholecystic fluid is identified. Layering biliary sludge is noted. No sonographic Murphy sign noted by sonographer. Common bile duct: Diameter: 3.9 mm and within normal limits. Liver: No focal lesion identified. The liver is increased in echogenicity consistent with hepatic steatosis. Portal vein is patent on color Doppler imaging with normal direction of blood flow towards the liver. IMPRESSION: 1. Fatty appearing liver. 2. Biliary sludge without secondary signs of acute cholecystitis. Electronically Signed   By: Ashley Royalty M.D.   On: 04/17/2017 22:14        Scheduled Meds: . enoxaparin (LOVENOX) injection  40 mg Subcutaneous Q24H  . fluticasone  1 spray Each Nare Daily  . fluticasone furoate-vilanterol  1 puff Inhalation Daily  . lisinopril  10 mg Oral Daily  . pantoprazole  40 mg Oral Daily   Continuous Infusions: . sodium chloride 200 mL/hr at 04/18/17 0549     LOS: 0  days    Time spent: 35 minutes    Fayrene Helper, MD [facility] [phone]   If 7PM-7AM, please contact night-coverage [email or web address] Password [facility] 04/18/2017, 7:54 AM

## 2017-04-18 NOTE — Consult Note (Signed)
[facility] Surgery Consult Note  [name] 27-Aug-1975  [id number].    Requesting MD: Marcelline Deist Chief Complaint/Reason for Consult: Abdominal pain  HPI:  [name] is a 41yo male admitted to [facility] yesterday with possible pancreatitis. Patient states that he started having upper abdominal pain 9/8 after eating [place] food and a milk shake. He has never had pain like this before. The pain has waxed and waned in intensity. It is worse with PO intake. Denies nausea, vomiting, fever, chills, diarrhea, dysuria, CP, SOB. The pain is currently epigastric and radiating into his back.   Hospital workup: - u/s shows biliary sludge without secondary signs of acute cholecystitis - WBC 15.3 - LFTs WNL, lipase 103  PMH significant for HTN, GERD, asthma Abdominal surgical history: none Nonsmoker Drinks alcohol 2x/year Employment: works on cars  ROS: Review of Systems  Constitutional: Negative.   HENT: Negative.   Eyes: Negative.   Respiratory: Negative.   Cardiovascular: Negative.   Gastrointestinal: Positive for abdominal pain. Negative for constipation, diarrhea, heartburn, nausea and vomiting.  Genitourinary: Negative.   Musculoskeletal: Negative.   Skin: Negative.   Neurological: Negative.     All systems reviewed and otherwise negative except for as above  Family History  Problem Relation Age of Onset  . Asthma Brother   . Diabetes Father   . Hypertension Father   . Diabetes Paternal Grandmother   . Hypertension Paternal Grandmother     Past Medical History:  Diagnosis Date  . Asthma     Past Surgical History:  Procedure Laterality Date  . BACK SURGERY      Social History:  reports that he has never smoked. He has never used smokeless tobacco. His alcohol and drug histories are not on file.  Allergies: No Known Allergies  Medications Prior to Admission  Medication Sig Dispense Refill  . Albuterol Sulfate (PROAIR RESPICLICK) 532 (90 Base) MCG/ACT AEPB  Inhale 2 puffs into the lungs every 4 (four) hours as needed. 1 each 1  . budesonide-formoterol (SYMBICORT) 160-4.5 MCG/ACT inhaler Inhale two puffs twice daily to prevent cough or wheeze. Rinse, gargle, and spit after use. 1 Inhaler 5  . cetirizine (ZYRTEC) 10 MG tablet Take 10 mg by mouth daily.    Marland Kitchen lisinopril (PRINIVIL,ZESTRIL) 10 MG tablet Take 10 mg by mouth daily.    Marland Kitchen omeprazole (PRILOSEC) 20 MG capsule Take 20 mg by mouth 2 (two) times daily before a meal.     . sucralfate (CARAFATE) 1 g tablet Take 1 g by mouth 4 (four) times daily.    . traMADol (ULTRAM) 50 MG tablet Take 50 mg by mouth 4 (four) times daily as needed.    . Triamcinolone Acetonide (NASACORT ALLERGY 24HR NA) Place 1 spray into the nose daily.      Prior to Admission medications   Medication Sig Start Date End Date Taking? Authorizing Provider  Albuterol Sulfate (PROAIR RESPICLICK) 992 (90 Base) MCG/ACT AEPB Inhale 2 puffs into the lungs every 4 (four) hours as needed. 12/06/16  Yes Kozlow, Donnamarie Poag, MD  budesonide-formoterol [facility]) 160-4.5 MCG/ACT inhaler Inhale two puffs twice daily to prevent cough or wheeze. Rinse, gargle, and spit after use. 12/06/16  Yes Kozlow, Donnamarie Poag, MD  cetirizine (ZYRTEC) 10 MG tablet Take 10 mg by mouth daily.   Yes [provider]  lisinopril (PRINIVIL,ZESTRIL) 10 MG tablet Take 10 mg by mouth daily. 04/16/17  Yes [provider]  omeprazole (PRILOSEC) 20 MG capsule Take 20 mg by mouth  2 (two) times daily before a meal.    Yes [provider]  sucralfate (CARAFATE) 1 g tablet Take 1 g by mouth 4 (four) times daily. 04/16/17  Yes [provider]  traMADol (ULTRAM) 50 MG tablet Take 50 mg by mouth 4 (four) times daily as needed. 04/16/17  Yes [provider]  Triamcinolone Acetonide (NASACORT ALLERGY 24HR NA) Place 1 spray into the nose daily.   Yes [provider]    Blood pressure (!) 152/79, pulse 76, temperature 99.1 F (37.3 C), temperature  source Oral, resp. rate 18, SpO2 99 %. Physical Exam: General: pleasant, WD/WN white male who is laying in bed in NAD HEENT: head is normocephalic, atraumatic.  Sclera are noninjected.  Pupils equal and round.  Ears and nose without any masses or lesions.  Mouth is pink and moist. Dentition fair Heart: regular, rate, and rhythm.  No obvious murmurs, gallops, or rubs noted.  Palpable pedal pulses bilaterally Lungs: CTAB, no wheezes, rhonchi, or rales noted.  Respiratory effort nonlabored Abd: obese, soft, NT, +BS, no masses, hernias, or organomegaly. +TTP epigastric region MS: all 4 extremities are symmetrical with no cyanosis, clubbing, or edema. Skin: warm and dry with no masses, lesions, or rashes Psych: A&Ox3 with an appropriate affect. Neuro: cranial nerves grossly intact, extremity CSM intact bilaterally, normal speech  Results for orders placed or performed during the hospital encounter of 04/17/17 (from the past 48 hour(s))  Lipase, blood     Status: Abnormal   Collection Time: 04/17/17  1:13 PM  Result Value Ref Range   Lipase 103 (H) 11 - 51 U/L  Comprehensive metabolic panel     Status: Abnormal   Collection Time: 04/17/17  1:13 PM  Result Value Ref Range   Sodium 136 135 - 145 mmol/L   Potassium 3.8 3.5 - 5.1 mmol/L   Chloride 100 (L) 101 - 111 mmol/L   CO2 26 22 - 32 mmol/L   Glucose, Bld 116 (H) 65 - 99 mg/dL   BUN 13 6 - 20 mg/dL   Creatinine, Ser 0.85 0.61 - 1.24 mg/dL   Calcium 9.2 8.9 - 10.3 mg/dL   Total Protein 7.5 6.5 - 8.1 g/dL   Albumin 3.9 3.5 - 5.0 g/dL   AST 21 15 - 41 U/L   ALT 20 17 - 63 U/L   Alkaline Phosphatase 73 38 - 126 U/L   Total Bilirubin 1.2 0.3 - 1.2 mg/dL   GFR calc non Af Amer >60 >60 mL/min   GFR calc Af Amer >60 >60 mL/min    Comment: (NOTE) The eGFR has been calculated using the CKD EPI equation. This calculation has not been validated in all clinical situations. eGFR's persistently <60 mL/min signify possible Chronic  Kidney Disease.    Anion gap 10 5 - 15  CBC     Status: Abnormal   Collection Time: 04/17/17  1:13 PM  Result Value Ref Range   WBC 15.3 (H) 4.0 - 10.5 K/uL   RBC 5.38 4.22 - 5.81 MIL/uL   Hemoglobin 15.3 13.0 - 17.0 g/dL   HCT 45.4 39.0 - 52.0 %   MCV 84.4 78.0 - 100.0 fL   MCH 28.4 26.0 - 34.0 pg   MCHC 33.7 30.0 - 36.0 g/dL   RDW 12.8 11.5 - 15.5 %   Platelets 151 150 - 400 K/uL  Urinalysis, Routine w reflex microscopic     Status: Abnormal   Collection Time: 04/17/17  1:18 PM  Result Value  Ref Range   Color, Urine YELLOW YELLOW   APPearance CLEAR CLEAR   Specific Gravity, Urine 1.023 1.005 - 1.030   pH 8.0 5.0 - 8.0   Glucose, UA NEGATIVE NEGATIVE mg/dL   Hgb urine dipstick NEGATIVE NEGATIVE   Bilirubin Urine NEGATIVE NEGATIVE   Ketones, ur 5 (A) NEGATIVE mg/dL   Protein, ur 100 (A) NEGATIVE mg/dL   Nitrite NEGATIVE NEGATIVE   Leukocytes, UA NEGATIVE NEGATIVE   RBC / HPF 0-5 0 - 5 RBC/hpf   WBC, UA 0-5 0 - 5 WBC/hpf   Bacteria, UA NONE SEEN NONE SEEN   Squamous Epithelial / LPF NONE SEEN NONE SEEN   Mucus PRESENT   HIV antibody (Routine Testing)     Status: None   Collection Time: 04/18/17  3:36 AM  Result Value Ref Range   HIV Screen 4th Generation wRfx Non Reactive Non Reactive    Comment: (NOTE) Performed At: [facility] [place], [place] [id number] Lindon Romp MD EX:[id number]   CBC     Status: Abnormal   Collection Time: 04/18/17  3:36 AM  Result Value Ref Range   WBC 14.6 (H) 4.0 - 10.5 K/uL   RBC 4.88 4.22 - 5.81 MIL/uL   Hemoglobin 13.4 13.0 - 17.0 g/dL   HCT 41.8 39.0 - 52.0 %   MCV 85.7 78.0 - 100.0 fL   MCH 27.5 26.0 - 34.0 pg   MCHC 32.1 30.0 - 36.0 g/dL   RDW 13.1 11.5 - 15.5 %   Platelets 136 (L) 150 - 400 K/uL  Comprehensive metabolic panel     Status: Abnormal   Collection Time: 04/18/17  3:36 AM  Result Value Ref Range   Sodium 136 135 - 145 mmol/L   Potassium 3.7 3.5 - 5.1 mmol/L   Chloride 102 101 - 111  mmol/L   CO2 28 22 - 32 mmol/L   Glucose, Bld 115 (H) 65 - 99 mg/dL   BUN 11 6 - 20 mg/dL   Creatinine, Ser 0.83 0.61 - 1.24 mg/dL   Calcium 8.6 (L) 8.9 - 10.3 mg/dL   Total Protein 6.4 (L) 6.5 - 8.1 g/dL   Albumin 3.3 (L) 3.5 - 5.0 g/dL   AST 15 15 - 41 U/L   ALT 17 17 - 63 U/L   Alkaline Phosphatase 58 38 - 126 U/L   Total Bilirubin 1.0 0.3 - 1.2 mg/dL   GFR calc non Af Amer >60 >60 mL/min   GFR calc Af Amer >60 >60 mL/min    Comment: (NOTE) The eGFR has been calculated using the CKD EPI equation. This calculation has not been validated in all clinical situations. eGFR's persistently <60 mL/min signify possible Chronic Kidney Disease.    Anion gap 6 5 - 15  Lipid panel     Status: None   Collection Time: 04/18/17  3:36 AM  Result Value Ref Range   Cholesterol 148 0 - 200 mg/dL   Triglycerides 61 <150 mg/dL   HDL 41 >40 mg/dL   Total CHOL/HDL Ratio 3.6 RATIO   VLDL 12 0 - 40 mg/dL   LDL Cholesterol 95 0 - 99 mg/dL    Comment:        Total Cholesterol/HDL:CHD Risk Coronary Heart Disease Risk Table                     Men   Women  1/2 Average Risk   3.4   3.3  Average Risk  5.0   4.4  2 X Average Risk   9.6   7.1  3 X Average Risk  23.4   11.0        Use the calculated Patient Ratio above and the CHD Risk Table to determine the patient's CHD Risk.        ATP III CLASSIFICATION (LDL):  <100     mg/dL   Optimal  100-129  mg/dL   Near or Above                    Optimal  130-159  mg/dL   Borderline  160-189  mg/dL   High  >190     mg/dL   Very High    US Abdomen Limited  Result Date: 04/17/2017 EXAM: ULTRASOUND ABDOMEN LIMITED RIGHT UPPER QUADRANT COMPARISON:  None. FINDINGS: Gallbladder: No gallstones or wall thickening visualized. No pericholecystic fluid is identified. Layering biliary sludge is noted. No sonographic Murphy sign noted by sonographer. Common bile duct: Diameter: 3.9 mm and within normal limits. Liver: No focal lesion identified. The liver is  increased in echogenicity consistent with hepatic steatosis. Portal vein is patent on color Doppler imaging with normal direction of blood flow towards the liver. IMPRESSION: 1. Fatty appearing liver. 2. Biliary sludge without secondary signs of acute cholecystitis. Electronically Signed   By: Ashley Royalty M.D.   On: 04/17/2017 22:14    Anti-infectives    None       Assessment/Plan HTN - started lisinopril 9/8 Asthma GERD  Abdominal pain Possible pancreatitis - onset upper abdominal pain 3 days ago - u/s shows biliary sludge without secondary signs of acute cholecystitis - WBC 15.3 - LFTs WNL, lipase 103  ID - none VTE - SCDs, lovenox FEN - IVF, NPO Foley - none Follow up - TBD  Plan - Recommend CT scan for further evaluation of abdominal pain. Repeat labs in AM. He is having increased abdominal pain with liquids so I will make him NPO.  Wellington Hampshire, [facility] Surgery 04/18/2017, 4:23 PM Pager: [phone] Consults: [phone] Mon-Fri 7:00 am-4:30 pm Sat-Sun 7:00 am-11:30 am

## 2017-04-18 NOTE — Progress Notes (Signed)
Pt has been complaining of abd pain that radiates to his back, he had IV dilaudid q4hr, with relief. I gave him oxycodone, he had no relief. Pt is NPO, he was on a clear liquid diet, had jello but had stomach pain after eating. Pt walked around unit.

## 2017-04-19 ENCOUNTER — Inpatient Hospital Stay (HOSPITAL_COMMUNITY): Payer: Managed Care, Other (non HMO)

## 2017-04-19 DIAGNOSIS — I10 Essential (primary) hypertension: Secondary | ICD-10-CM

## 2017-04-19 DIAGNOSIS — K859 Acute pancreatitis without necrosis or infection, unspecified: Principal | ICD-10-CM

## 2017-04-19 LAB — COMPREHENSIVE METABOLIC PANEL
ALT: 15 U/L — ABNORMAL LOW (ref 17–63)
AST: 15 U/L (ref 15–41)
Albumin: 3 g/dL — ABNORMAL LOW (ref 3.5–5.0)
Alkaline Phosphatase: 54 U/L (ref 38–126)
Anion gap: 8 (ref 5–15)
BUN: 8 mg/dL (ref 6–20)
CO2: 27 mmol/L (ref 22–32)
Calcium: 8.4 mg/dL — ABNORMAL LOW (ref 8.9–10.3)
Chloride: 100 mmol/L — ABNORMAL LOW (ref 101–111)
Creatinine, Ser: 0.77 mg/dL (ref 0.61–1.24)
GFR calc Af Amer: >60 mL/min (ref >60)
GFR calc non Af Amer: >60 mL/min (ref >60)
Glucose, Bld: 98 mg/dL (ref 65–99)
Potassium: 3.6 mmol/L (ref 3.5–5.1)
Sodium: 135 mmol/L (ref 135–145)
Total Bilirubin: 1.2 mg/dL (ref 0.3–1.2)
Total Protein: 6.3 g/dL — ABNORMAL LOW (ref 6.5–8.1)

## 2017-04-19 LAB — CBC
HCT: 39 % (ref 39.0–52.0)
Hemoglobin: 12.5 g/dL — ABNORMAL LOW (ref 13.0–17.0)
MCH: 27.5 pg (ref 26.0–34.0)
MCHC: 32.1 g/dL (ref 30.0–36.0)
MCV: 85.9 fL (ref 78.0–100.0)
Platelets: 113 10*3/uL — ABNORMAL LOW (ref 150–400)
RBC: 4.54 MIL/uL (ref 4.22–5.81)
RDW: 13.1 % (ref 11.5–15.5)
WBC: 14.5 10*3/uL — ABNORMAL HIGH (ref 4.0–10.5)

## 2017-04-19 LAB — RAPID URINE DRUG SCREEN, HOSP PERFORMED
Amphetamines: NOT DETECTED
Barbiturates: NOT DETECTED
Benzodiazepines: NOT DETECTED
Cocaine: NOT DETECTED
Opiates: POSITIVE — AB
Tetrahydrocannabinol: NOT DETECTED

## 2017-04-19 LAB — LIPASE, BLOOD: Lipase: 29 U/L (ref 11–51)

## 2017-04-19 MED ORDER — POLYETHYLENE GLYCOL 3350 17 G PO PACK
17.0000 g | PACK | Freq: Every day | ORAL | Status: DC
  Administered 2017-04-19 – 2017-04-20 (×2): 17 g via ORAL
  Filled 2017-04-19 (×2): qty 1

## 2017-04-19 MED ORDER — HYDROMORPHONE HCL 1 MG/ML IJ SOLN
0.5000 mg | INTRAMUSCULAR | Status: DC | PRN
  Administered 2017-04-19: 0.5 mg via INTRAVENOUS
  Filled 2017-04-19: qty 1

## 2017-04-19 MED ORDER — AMLODIPINE BESYLATE 5 MG PO TABS: 5.0000 mg | ORAL_TABLET | Freq: Every day | ORAL | Status: DC

## 2017-04-19 MED ORDER — PROMETHAZINE HCL 25 MG/ML IJ SOLN
12.5000 mg | Freq: Four times a day (QID) | INTRAMUSCULAR | Status: DC | PRN
  Administered 2017-04-19 (×2): 25 mg via INTRAVENOUS
  Filled 2017-04-19 (×3): qty 1

## 2017-04-19 MED ORDER — PANTOPRAZOLE SODIUM 40 MG IV SOLR
40.0000 mg | INTRAVENOUS | Status: DC
  Administered 2017-04-19: 40 mg via INTRAVENOUS
  Filled 2017-04-19 (×2): qty 40

## 2017-04-19 NOTE — Progress Notes (Signed)
[facility] PROGRESS NOTE  [name] WER:[id number] DOB: 09/12/1975 DOA: 04/17/2017 PCP: Tamsen Roers, MD  Interim History and HPI [name] is a 41 year old male with a past medical history of asthma, HTN, GERD, and obesity who presented to the ED on 04/17/17 with epigastric pain x3 days and one episode of vomiting. Lipase mildly elevated and normal LFTs. Abdominal US showed biliary sludge, but no gallstone pathology. CT abdomen revealed uncomplicated acute pancreatitis. Patient has been NPO and receiving oxycodone for pain. He states he gets nauseous when attempting to eat or drink.   Assessment/Plan: 1. Acute pancreatitis -No history of similar episodes. Denies alcohol use. Negative gall stone work up.  -Order triglyceride level -Continue NPO and transition to clear liquids and soft foods as tolerated -Continue IV fluids and pain medication.   2. Hypertension -Continues to be elevated today, possibly due to pain.  -Continue Lisinopril and monitor   3. GERD -Stable. Continue Pantoprazole  4. Asthma -Stable at this time. Not currently on any medications  Code Status: Full Family Communication: Wife at bedside- curious about future plan and was reassured that he needs to be able to tolerate eating and drinking before he can be discharged.  Disposition Plan: Once medically stable.   Consultants:  Surgery  Procedures:  None  Antibiotics:  None   HPI/Subjective: Patient states he has never had an episode of pain like this before, and that he continues to feel nauseous and "can't keep anything down" when attempting to eat or drink. Hasn't had a bowel movement since he was admitted. Denies chest pain, shortness of breath, diarrhea.   Objective: Vitals:   04/18/17 2141 04/19/17 0514  BP: (!) 137/107 (!) 152/76  Pulse: 99 80  Resp: 18 18  Temp: 99.7 F (37.6 C) 99.2 F (37.3 C)  SpO2: 93% 98%    Intake/Output Summary (Last 24 hours) at 04/19/17 1143 Last  data filed at 04/19/17 0500  Gross per 24 hour  Intake          2123.33 ml  Output             1650 ml  Net           473.33 ml   Filed Weights   04/18/17 1315  Weight: (!) 136.5 kg (301 lb)    Exam:  General:  Patient resting in no acute distress. Sitting uncomfortably.   Cardiovascular: RRR, no murmurs, rubs or gallops. No edema.   Respiratory: Clear to auscultation bilaterally.   Abdomen: Decreased bowel sounds secondary to being NPO.   Musculoskeletal: Full ROM in all extremities.    Data Reviewed: Basic Metabolic Panel:  Recent Labs Lab 04/17/17 1313 04/18/17 0336 04/19/17 0459  NA 136 136 135  K 3.8 3.7 3.6  CL 100* 102 100*  CO2 26 28 27   GLUCOSE 116* 115* 98  BUN 13 11 8   CREATININE 0.85 0.83 0.77  CALCIUM 9.2 8.6* 8.4*   Liver Function Tests:  Recent Labs Lab 04/17/17 1313 04/18/17 0336 04/19/17 0459  AST 21 15 15   ALT 20 17 15*  ALKPHOS 73 58 54  BILITOT 1.2 1.0 1.2  PROT 7.5 6.4* 6.3*  ALBUMIN 3.9 3.3* 3.0*    Recent Labs Lab 04/17/17 1313 04/19/17 0459  LIPASE 103* 29   CBC:  Recent Labs Lab 04/17/17 1313 04/18/17 0336 04/19/17 0459  WBC 15.3* 14.6* 14.5*  HGB 15.3 13.4 12.5*  HCT 45.4 41.8 39.0  MCV 84.4 85.7 85.9  PLT  151 136* 113*   Studies: Ct Abdomen Pelvis W Contrast  Result Date: 04/19/2017 CLINICAL DATA:  Abdominal distention. EXAM: CT ABDOMEN AND PELVIS WITH CONTRAST TECHNIQUE: Multidetector CT imaging of the abdomen and pelvis was performed using the standard protocol following bolus administration of intravenous contrast. CONTRAST:  154m ISOVUE-300 IOPAMIDOL (ISOVUE-300) INJECTION 61% COMPARISON:  Abdominal ultrasound 04/17/2017 FINDINGS: Lower chest: Hypoventilatory changes in atelectasis at the lung bases. No pleural fluid. Hepatobiliary: The liver is prominent size spanning 20 cm cranial caudal with diffusely decreased hepatic density. No evidence of focal hepatic lesion. Gallbladder physiologically distended, no  calcified stone. No biliary dilatation. Pancreas: Moderate peripancreatic fat stranding about the pancreatic head. No ductal dilatation. Homogeneous enhancement without necrosis. No peripancreatic fluid collection. Prominent peripancreatic nodes are likely reactive. Spleen: Borderline enlarged spanning 13.5 cm.  No focal lesion. Adrenals/Urinary Tract: Normal adrenal glands. 4 mm nonobstructing stone in the upper left kidney. No hydronephrosis. No perinephric edema. Homogeneous enhancement with symmetric excretion on delayed phase imaging. Subcentimeter hypodensity in the upper left kidney is too small to characterize but likely small cyst. No focal lesion in the right kidney. Urinary bladder is physiologically distended. No bladder wall thickening. Stomach/Bowel: Stomach physiologically distended. Mild duodenal wall thickening which is likely reactive. Small bowel is otherwise nondistended without small bowel inflammation. No obstruction, enteric contrast reaches the colon. Normal appendix. Moderate colonic stool burden. No colonic inflammatory change. Vascular/Lymphatic: Small peripancreatic and retroperitoneal nodes are likely reactive. Circumaortic left renal vein. Normal caliber abdominal aorta. Reproductive: Prostate is unremarkable. Other: Small amount of free fluid tracking in both pericolic gutters in the pelvis. New free air. No loculated abscess. Fat within both inguinal canals. Small fat containing umbilical hernia. Musculoskeletal: There are no acute or suspicious osseous abnormalities. Degenerative change in the lower thoracic spine with Schmorl's nodes and vacuum phenomenon. IMPRESSION: 1. Uncomplicated acute pancreatitis. No pancreatic pseudocyst or pancreatic necrosis. Small amount of free fluid tracks in the pericolic gutters. 2. Hepatic steatosis. 3. Nonobstructing left nephrolithiasis. Electronically Signed   By: MJeb LeveringM.D.   On: 04/19/2017 01:44   UKoreaAbdomen Limited  Result Date:  04/17/2017 EXAM: ULTRASOUND ABDOMEN LIMITED RIGHT UPPER QUADRANT COMPARISON:  None. FINDINGS: Gallbladder: No gallstones or wall thickening visualized. No pericholecystic fluid is identified. Layering biliary sludge is noted. No sonographic Murphy sign noted by sonographer. Common bile duct: Diameter: 3.9 mm and within normal limits. Liver: No focal lesion identified. The liver is increased in echogenicity consistent with hepatic steatosis. Portal vein is patent on color Doppler imaging with normal direction of blood flow towards the liver. IMPRESSION: 1. Fatty appearing liver. 2. Biliary sludge without secondary signs of acute cholecystitis. Electronically Signed   By: DAshley RoyaltyM.D.   On: 04/17/2017 22:14    Scheduled Meds: . enoxaparin (LOVENOX) injection  40 mg Subcutaneous Q24H  . fluticasone  1 spray Each Nare Daily  . fluticasone furoate-vilanterol  1 puff Inhalation Daily  . lisinopril  10 mg Oral Daily  . pantoprazole  40 mg Oral Daily   Continuous Infusions: . lactated ringers 200 mL/hr at 04/19/17 04098  Principal Problem:   Pancreatitis Active Problems:   Moderate persistent asthma   Essential hypertension   Epigastric pain  Time spent: 25 minutes  MMinda Ditto PA-S   [facility] If 7PM-7AM, please contact night-coverage at [email or web address], password [facility]9/07/2017, 11:43 AM  LOS: 1 day

## 2017-04-19 NOTE — Progress Notes (Signed)
Subjective CT completed yesterday; uncomplicated acute pancreatitis; no gallstones seen Still with MEG pain today; tolerating sips of water without n/v.  Objective: Vital signs in last 24 hours: Temp:  [99.1 F (37.3 C)-99.7 F (37.6 C)] 99.2 F (37.3 C) (09/12 0514) Pulse Rate:  [76-99] 80 (09/12 0514) Resp:  [18] 18 (09/12 0514) BP: (137-152)/(76-107) 152/76 (09/12 0514) SpO2:  [93 %-99 %] 98 % (09/12 0514) Weight:  [136.5 kg (301 lb)] 136.5 kg (301 lb) (09/11 1315) Last BM Date: 04/15/17  Intake/Output from previous day: 09/11 0701 - 09/12 0700 In: 2123.3 [P.O.:240; I.V.:1883.3] Out: 2150 [Urine:2150] Intake/Output this shift: No intake/output data recorded.  Gen: NAD, comfortable CV: RRR Pulm: Normal work of breathing Abd: Obese, soft, ttp in MEG; negative Murphy's Ext: SCDs in place  Lab Results: CBC   Recent Labs  04/18/17 0336 04/19/17 0459  WBC 14.6* 14.5*  HGB 13.4 12.5*  HCT 41.8 39.0  PLT 136* 113*   BMET  Recent Labs  04/18/17 0336 04/19/17 0459  NA 136 135  K 3.7 3.6  CL 102 100*  CO2 28 27  GLUCOSE 115* 98  BUN 11 8  CREATININE 0.83 0.77  CALCIUM 8.6* 8.4*   PT/INR No results for input(s): LABPROT, INR in the last 72 hours. ABG No results for input(s): PHART, HCO3 in the last 72 hours.  Invalid input(s): PCO2, PO2  Studies/Results:  Anti-infectives: Anti-infectives    None       Assessment/Plan: Patient Active Problem List   Diagnosis Date Noted  . Epigastric pain   . Pancreatitis 04/17/2017  . Essential hypertension 04/17/2017  . Moderate persistent asthma 07/21/2015  . Allergic rhinoconjunctivitis 07/21/2015  . Esophageal reflux 07/21/2015  . ABNORMALITY OF GAIT 11/12/2009  . TARSAL TUNNEL SYNDROME, LEFT 10/08/2009  . PES PLANUS 10/08/2009   31M morbidly obese with acute pancreatitis and sludge noted on [place] RUQ - surgery consulted to evaluate for gallbladder as source of pancreatitis  -Continue supportive care for  time being; IVF -Lipase normal today but still with significant MEG discomfort and CT showing peripancreatic inflammation/stranding -Lovenox 27m/d, SCDs -Ambulate 5x/day   LOS: 1 day   CIleana Roup MD [facility]

## 2017-04-19 NOTE — Care Management Note (Addendum)
Case Management Note  Patient Details  Name: [name] MRN: [id number] Date of Birth: 09/14/1975  Subjective/Objective:                    Action/Plan:  Continuing to follow for discharge needs Expected Discharge Date:                  Expected Discharge Plan:  Home/Self Care  In-House Referral:     Discharge planning Services     Post Acute Care Choice:    Choice offered to:     DME Arranged:    DME Agency:     HH Arranged:    HH Agency:     Status of Service:  In process, will continue to follow  If discussed at Long Length of Stay Meetings, dates discussed:    Additional Comments:  Marilu Favre, RN 04/19/2017, 10:39 AM

## 2017-04-20 DIAGNOSIS — J4540 Moderate persistent asthma, uncomplicated: Secondary | ICD-10-CM

## 2017-04-20 LAB — CBC
HCT: 39.2 % (ref 39.0–52.0)
Hemoglobin: 12.3 g/dL — ABNORMAL LOW (ref 13.0–17.0)
MCH: 27.3 pg (ref 26.0–34.0)
MCHC: 31.4 g/dL (ref 30.0–36.0)
MCV: 87.1 fL (ref 78.0–100.0)
Platelets: 133 10*3/uL — ABNORMAL LOW (ref 150–400)
RBC: 4.5 MIL/uL (ref 4.22–5.81)
RDW: 13.3 % (ref 11.5–15.5)
WBC: 11.2 10*3/uL — ABNORMAL HIGH (ref 4.0–10.5)

## 2017-04-20 MED ORDER — LISINOPRIL 10 MG PO TABS
10.0000 mg | ORAL_TABLET | Freq: Every day | ORAL | Status: DC
  Administered 2017-04-20 – 2017-04-21 (×2): 10 mg via ORAL
  Filled 2017-04-20 (×2): qty 1

## 2017-04-20 MED ORDER — PANTOPRAZOLE SODIUM 40 MG PO TBEC
40.0000 mg | DELAYED_RELEASE_TABLET | Freq: Every day | ORAL | Status: DC
  Administered 2017-04-20: 40 mg via ORAL
  Filled 2017-04-20: qty 1

## 2017-04-20 MED ORDER — OXYCODONE HCL 5 MG PO TABS
5.0000 mg | ORAL_TABLET | ORAL | Status: DC | PRN
  Administered 2017-04-20: 5 mg via ORAL
  Administered 2017-04-21: 10 mg via ORAL
  Filled 2017-04-20: qty 1
  Filled 2017-04-20: qty 2

## 2017-04-20 NOTE — Progress Notes (Signed)
Patient ID: [name], male   DOB: 1975/11/14, 41 y.o.   MRN: [id number]  [facility] Surgery Progress Note     Subjective: CC- pancreatitis Feeling better today. Denies any current abdominal pain. Some nausea earlier, now resolved. No emesis. Tolerated clear liquids yesterday. Has not had a BM since admission.  Objective: Vital signs in last 24 hours: Temp:  [98.9 F (37.2 C)-99.3 F (37.4 C)] 98.9 F (37.2 C) (09/13 0558) Pulse Rate:  [72-81] 72 (09/13 0558) Resp:  [18] 18 (09/13 0558) BP: (137-149)/(73-85) 137/73 (09/13 0558) SpO2:  [98 %-100 %] 100 % (09/13 0558) Last BM Date: 04/15/17  Intake/Output from previous day: 09/12 0701 - 09/13 0700 In: 5470 [P.O.:900; I.V.:4570] Out: 2275 [Urine:2275] Intake/Output this shift: Total I/O In: -  Out: 500 [Urine:500]  PE: Gen:  Alert, NAD HEENT: EOM's intact, pupils equal and round Card:  RRR, no M/G/R heard Pulm:  CTAB, no W/R/R, effort normal Abd: obese, soft, ND, +BS, no masses, hernias, or organomegaly. Minimal epigastric tenderness Ext:  No erythema, edema, or tenderness BUE/BLE  Psych: A&Ox3  Skin: no rashes noted, warm and dry  Lab Results:   Recent Labs  04/19/17 0459 04/20/17 0442  WBC 14.5* 11.2*  HGB 12.5* 12.3*  HCT 39.0 39.2  PLT 113* 133*   BMET  Recent Labs  04/18/17 0336 04/19/17 0459  NA 136 135  K 3.7 3.6  CL 102 100*  CO2 28 27  GLUCOSE 115* 98  BUN 11 8  CREATININE 0.83 0.77  CALCIUM 8.6* 8.4*   PT/INR No results for input(s): LABPROT, INR in the last 72 hours. CMP     Component Value Date/Time   NA 135 04/19/2017 0459   K 3.6 04/19/2017 0459   CL 100 (L) 04/19/2017 0459   CO2 27 04/19/2017 0459   GLUCOSE 98 04/19/2017 0459   BUN 8 04/19/2017 0459   CREATININE 0.77 04/19/2017 0459   CALCIUM 8.4 (L) 04/19/2017 0459   PROT 6.3 (L) 04/19/2017 0459   ALBUMIN 3.0 (L) 04/19/2017 0459   AST 15 04/19/2017 0459   ALT 15 (L) 04/19/2017 0459   ALKPHOS 54 04/19/2017 0459    BILITOT 1.2 04/19/2017 0459   GFRNONAA >60 04/19/2017 0459   GFRAA >60 04/19/2017 0459   Lipase     Component Value Date/Time   LIPASE 29 04/19/2017 0459       Studies/Results: Ct Abdomen Pelvis W Contrast  Result Date: 04/19/2017 CLINICAL DATA:  Abdominal distention. EXAM: CT ABDOMEN AND PELVIS WITH CONTRAST TECHNIQUE: Multidetector CT imaging of the abdomen and pelvis was performed using the standard protocol following bolus administration of intravenous contrast. CONTRAST:  168m ISOVUE-300 IOPAMIDOL (ISOVUE-300) INJECTION 61% COMPARISON:  Abdominal ultrasound 04/17/2017 FINDINGS: Lower chest: Hypoventilatory changes in atelectasis at the lung bases. No pleural fluid. Hepatobiliary: The liver is prominent size spanning 20 cm cranial caudal with diffusely decreased hepatic density. No evidence of focal hepatic lesion. Gallbladder physiologically distended, no calcified stone. No biliary dilatation. Pancreas: Moderate peripancreatic fat stranding about the pancreatic head. No ductal dilatation. Homogeneous enhancement without necrosis. No peripancreatic fluid collection. Prominent peripancreatic nodes are likely reactive. Spleen: Borderline enlarged spanning 13.5 cm.  No focal lesion. Adrenals/Urinary Tract: Normal adrenal glands. 4 mm nonobstructing stone in the upper left kidney. No hydronephrosis. No perinephric edema. Homogeneous enhancement with symmetric excretion on delayed phase imaging. Subcentimeter hypodensity in the upper left kidney is too small to characterize but likely small cyst. No focal lesion in the right kidney.  Urinary bladder is physiologically distended. No bladder wall thickening. Stomach/Bowel: Stomach physiologically distended. Mild duodenal wall thickening which is likely reactive. Small bowel is otherwise nondistended without small bowel inflammation. No obstruction, enteric contrast reaches the colon. Normal appendix. Moderate colonic stool burden. No colonic  inflammatory change. Vascular/Lymphatic: Small peripancreatic and retroperitoneal nodes are likely reactive. Circumaortic left renal vein. Normal caliber abdominal aorta. Reproductive: Prostate is unremarkable. Other: Small amount of free fluid tracking in both pericolic gutters in the pelvis. New free air. No loculated abscess. Fat within both inguinal canals. Small fat containing umbilical hernia. Musculoskeletal: There are no acute or suspicious osseous abnormalities. Degenerative change in the lower thoracic spine with Schmorl's nodes and vacuum phenomenon. IMPRESSION: 1. Uncomplicated acute pancreatitis. No pancreatic pseudocyst or pancreatic necrosis. Small amount of free fluid tracks in the pericolic gutters. 2. Hepatic steatosis. 3. Nonobstructing left nephrolithiasis. Electronically Signed   By: Jeb Levering M.D.   On: 04/19/2017 01:44    Anti-infectives: Anti-infectives    None       Assessment/Plan HTN Asthma Obese GERD  Pancreatitis - unclear etiology - u/s shows biliary sludge without secondary signs of acute cholecystitis - lipid panel normal - CT 2/75 showeed uncomplicated acute pancreatitis, no pancreatic pseudocyst or pancreatic necrosis - lipase 29 (9/12) - WBC trending down 11.2, afebrile  ID - none VTE - SCDs, lovenox FEN - IVF, clears Foley - none Follow up - TBD  Plan - Pancreatitis seems to be improving. Pain improved and medicine team advancing his diet.  Unsure exact etiology of his pancreatitis, could be due to gallstones. Will discuss with MD if cholecystectomy would be warranted now, or if this is something he should wait and consider in the future.   LOS: 2 days    Wellington Hampshire , [facility] Surgery 04/20/2017, 9:19 AM Pager: [phone] Consults: [phone] Mon-Fri 7:00 am-4:30 pm Sat-Sun 7:00 am-11:30 am

## 2017-04-20 NOTE — Progress Notes (Signed)
PROGRESS NOTE    [name]  CHE:[id number] DOB: Dec 14, 1975 DOA: 04/17/2017 PCP: Tamsen Roers, MD   Brief Narrative: 41 year old male with history of past, hypertension, GERD, obesity presented with nausea vomiting and abdominal pain for 3 days. Found to have uncomplicated acute pancreatitis.  Assessment & Plan:  # Acute uncomplicated pancreatitis: Unknown etiology -Denied alcohol use, no biliary stone or dilatation. TG not elevated. Denied any herbal medications Clinically improving. CT scan reviewed. Tolerating clear liquid diet. Advance to full liquid and plan to advance to soft as tolerated. Continue supportive care. Discontinue IV fluid. Discussed with the patient and nurse at bedside. Continue Protonix.  #Hypertension: Resume lisinopril. Monitor blood pressure  #GERD: Continue Protonix  #History of mild intermittent asthma: Stable. Continue Flonase  DVT prophylaxis: Lovenox subcutaneous Code Status: Full code Family Communication: No family at bedside Disposition Plan: Currently admitted    Consultants:   General surgery  Procedures: None Antimicrobials: None  Subjective: Feeling better. Has nausea but no vomiting. Abdominal pain is mildly improved. No chest pain or shortness of breath.  Objective: Vitals:   04/19/17 1147 04/19/17 1444 04/19/17 2059 04/20/17 0558  BP:  (!) 149/77 138/85 137/73  Pulse:  76 81 72  Resp:  18 18 18   Temp:  99.3 F (37.4 C) 99 F (37.2 C) 98.9 F (37.2 C)  TempSrc:  Oral Oral Oral  SpO2: 98% 98% 99% 100%  Weight:      Height:        Intake/Output Summary (Last 24 hours) at 04/20/17 1212 Last data filed at 04/20/17 1045  Gross per 24 hour  Intake             4590 ml  Output             2675 ml  Net             1915 ml   Filed Weights   04/18/17 1315  Weight: (!) 136.5 kg (301 lb)    Examination:  General exam: Appears calm and comfortable  Respiratory system: Clear to auscultation. Respiratory effort normal.  No wheezing or crackle Cardiovascular system: S1 & S2 heard, RRR.  No pedal edema. Gastrointestinal system: Abdomen is nondistended, soft and nontender. Normal bowel sounds heard. Central nervous system: Alert and oriented. No focal neurological deficits. Extremities: Symmetric 5 x 5 power. Skin: No rashes, lesions or ulcers Psychiatry: Judgement and insight appear normal. Mood & affect appropriate.     Data Reviewed: I have personally reviewed following labs and imaging studies  CBC:  Recent Labs Lab 04/17/17 1313 04/18/17 0336 04/19/17 0459 04/20/17 0442  WBC 15.3* 14.6* 14.5* 11.2*  HGB 15.3 13.4 12.5* 12.3*  HCT 45.4 41.8 39.0 39.2  MCV 84.4 85.7 85.9 87.1  PLT 151 136* 113* 536*   Basic Metabolic Panel:  Recent Labs Lab 04/17/17 1313 04/18/17 0336 04/19/17 0459  NA 136 136 135  K 3.8 3.7 3.6  CL 100* 102 100*  CO2 26 28 27   GLUCOSE 116* 115* 98  BUN 13 11 8   CREATININE 0.85 0.83 0.77  CALCIUM 9.2 8.6* 8.4*   GFR: Estimated Creatinine Clearance: 161 mL/min (by C-G formula based on SCr of 0.77 mg/dL). Liver Function Tests:  Recent Labs Lab 04/17/17 1313 04/18/17 0336 04/19/17 0459  AST 21 15 15   ALT 20 17 15*  ALKPHOS 73 58 54  BILITOT 1.2 1.0 1.2  PROT 7.5 6.4* 6.3*  ALBUMIN 3.9 3.3* 3.0*    Recent Labs Lab 04/17/17  1313 04/19/17 0459  LIPASE 103* 29   No results for input(s): AMMONIA in the last 168 hours. Coagulation Profile: No results for input(s): INR, PROTIME in the last 168 hours. Cardiac Enzymes: No results for input(s): CKTOTAL, CKMB, CKMBINDEX, TROPONINI in the last 168 hours. BNP (last 3 results) No results for input(s): PROBNP in the last 8760 hours. HbA1C: No results for input(s): HGBA1C in the last 72 hours. CBG: No results for input(s): GLUCAP in the last 168 hours. Lipid Profile:  Recent Labs  04/18/17 0336  CHOL 148  HDL 41  LDLCALC 95  TRIG 61  CHOLHDL 3.6   Thyroid Function Tests: No results for input(s):  TSH, T4TOTAL, FREET4, T3FREE, THYROIDAB in the last 72 hours. Anemia Panel: No results for input(s): VITAMINB12, FOLATE, FERRITIN, TIBC, IRON, RETICCTPCT in the last 72 hours. Sepsis Labs: No results for input(s): PROCALCITON, LATICACIDVEN in the last 168 hours.  No results found for this or any previous visit (from the past 240 hour(s)).       Radiology Studies: Ct Abdomen Pelvis W Contrast  Result Date: 04/19/2017 CLINICAL DATA:  Abdominal distention. EXAM: CT ABDOMEN AND PELVIS WITH CONTRAST TECHNIQUE: Multidetector CT imaging of the abdomen and pelvis was performed using the standard protocol following bolus administration of intravenous contrast. CONTRAST:  130m ISOVUE-300 IOPAMIDOL (ISOVUE-300) INJECTION 61% COMPARISON:  Abdominal ultrasound 04/17/2017 FINDINGS: Lower chest: Hypoventilatory changes in atelectasis at the lung bases. No pleural fluid. Hepatobiliary: The liver is prominent size spanning 20 cm cranial caudal with diffusely decreased hepatic density. No evidence of focal hepatic lesion. Gallbladder physiologically distended, no calcified stone. No biliary dilatation. Pancreas: Moderate peripancreatic fat stranding about the pancreatic head. No ductal dilatation. Homogeneous enhancement without necrosis. No peripancreatic fluid collection. Prominent peripancreatic nodes are likely reactive. Spleen: Borderline enlarged spanning 13.5 cm.  No focal lesion. Adrenals/Urinary Tract: Normal adrenal glands. 4 mm nonobstructing stone in the upper left kidney. No hydronephrosis. No perinephric edema. Homogeneous enhancement with symmetric excretion on delayed phase imaging. Subcentimeter hypodensity in the upper left kidney is too small to characterize but likely small cyst. No focal lesion in the right kidney. Urinary bladder is physiologically distended. No bladder wall thickening. Stomach/Bowel: Stomach physiologically distended. Mild duodenal wall thickening which is likely reactive. Small  bowel is otherwise nondistended without small bowel inflammation. No obstruction, enteric contrast reaches the colon. Normal appendix. Moderate colonic stool burden. No colonic inflammatory change. Vascular/Lymphatic: Small peripancreatic and retroperitoneal nodes are likely reactive. Circumaortic left renal vein. Normal caliber abdominal aorta. Reproductive: Prostate is unremarkable. Other: Small amount of free fluid tracking in both pericolic gutters in the pelvis. New free air. No loculated abscess. Fat within both inguinal canals. Small fat containing umbilical hernia. Musculoskeletal: There are no acute or suspicious osseous abnormalities. Degenerative change in the lower thoracic spine with Schmorl's nodes and vacuum phenomenon. IMPRESSION: 1. Uncomplicated acute pancreatitis. No pancreatic pseudocyst or pancreatic necrosis. Small amount of free fluid tracks in the pericolic gutters. 2. Hepatic steatosis. 3. Nonobstructing left nephrolithiasis. Electronically Signed   By: MJeb LeveringM.D.   On: 04/19/2017 01:44        Scheduled Meds: . enoxaparin (LOVENOX) injection  40 mg Subcutaneous Q24H  . fluticasone  1 spray Each Nare Daily  . fluticasone furoate-vilanterol  1 puff Inhalation Daily  . pantoprazole (PROTONIX) IV  40 mg Intravenous Q24H  . polyethylene glycol  17 g Oral Daily   Continuous Infusions:   LOS: 2 days    Julicia Krieger PTanna Furry MD  [facility] Pager [phone]  If 7PM-7AM, please contact night-coverage [email or web address] Password [email or web address] 04/20/2017, 12:12 PM

## 2017-04-21 ENCOUNTER — Encounter: Payer: Self-pay | Admitting: Physician Assistant

## 2017-04-21 NOTE — Progress Notes (Signed)
Pt is feeling a lot better. Tolerating soft diet. Denies abd pain. Discharge instructions was given to pt, discharged home accompanied by wife.

## 2017-04-21 NOTE — Progress Notes (Signed)
Patient ID: [name], male   DOB: 1976/06/20, 41 y.o.   MRN: [id number]  [facility] Surgery Progress Note     Subjective: CC- pancreatitis Slept well. He reports some mild upper abdominal discomfort, but this continues to improve. It was slightly worse last night while eating dinner. Denies n/v. Tolerating diet. Had 2 BMs yesterday.  Objective: Vital signs in last 24 hours: Temp:  [97.5 F (36.4 C)-99.7 F (37.6 C)] 98.8 F (37.1 C) (09/14 0533) Pulse Rate:  [72-80] 72 (09/14 0533) Resp:  [18] 18 (09/14 0533) BP: (127-149)/(48-78) 134/67 (09/14 0533) SpO2:  [98 %-100 %] 98 % (09/13 2117) Last BM Date: 04/15/17  Intake/Output from previous day: 09/13 0701 - 09/14 0700 In: 960 [P.O.:960] Out: 2675 [Urine:2675] Intake/Output this shift: No intake/output data recorded.  PE: Gen:  Alert, NAD HEENT: EOM's intact, pupils equal and round Card:  RRR, no M/G/R heard Pulm:  CTAB, no W/R/R, effort normal Abd: obese, soft, ND, +BS, no masses, hernias, or organomegaly. Minimal epigastric tenderness Ext:  No erythema, edema, or tenderness BUE/BLE  Psych: A&Ox3  Skin: no rashes noted, warm and dry  Lab Results:   Recent Labs  04/19/17 0459 04/20/17 0442  WBC 14.5* 11.2*  HGB 12.5* 12.3*  HCT 39.0 39.2  PLT 113* 133*   BMET  Recent Labs  04/19/17 0459  NA 135  K 3.6  CL 100*  CO2 27  GLUCOSE 98  BUN 8  CREATININE 0.77  CALCIUM 8.4*   PT/INR No results for input(s): LABPROT, INR in the last 72 hours. CMP     Component Value Date/Time   NA 135 04/19/2017 0459   K 3.6 04/19/2017 0459   CL 100 (L) 04/19/2017 0459   CO2 27 04/19/2017 0459   GLUCOSE 98 04/19/2017 0459   BUN 8 04/19/2017 0459   CREATININE 0.77 04/19/2017 0459   CALCIUM 8.4 (L) 04/19/2017 0459   PROT 6.3 (L) 04/19/2017 0459   ALBUMIN 3.0 (L) 04/19/2017 0459   AST 15 04/19/2017 0459   ALT 15 (L) 04/19/2017 0459   ALKPHOS 54 04/19/2017 0459   BILITOT 1.2 04/19/2017 0459   GFRNONAA >60  04/19/2017 0459   GFRAA >60 04/19/2017 0459   Lipase     Component Value Date/Time   LIPASE 29 04/19/2017 0459       Studies/Results: No results found.  Anti-infectives: Anti-infectives    None       Assessment/Plan HTN Asthma Obese GERD  Pancreatitis - unclear etiology - u/s shows biliary sludge without secondary signs of acute cholecystitis - lipid panel normal - CT 9/20 showeed uncomplicated acute pancreatitis, no pancreatic pseudocyst or pancreatic necrosis - lipase 29 (9/12)  ID - none VTE - SCDs, lovenox FEN - soft diet Foley - none Follow up - TBD  Plan - Patient tolerating diet and pain well controlled. Pancreatitis improving.  Unsure exact etiology of his pancreatitis. No definite stones on u/s. If pancreatitis recurs may consider cholecystectomy in the future.   LOS: 3 days    Wellington Hampshire , [facility] Surgery 04/21/2017, 8:24 AM Pager: [phone] Consults: [phone] Mon-Fri 7:00 am-4:30 pm Sat-Sun 7:00 am-11:30 am

## 2017-04-21 NOTE — Discharge Summary (Signed)
Physician Discharge Summary  [name] EAV:[id number] DOB: [birth year] DOA: 04/17/2017  PCP: Tamsen Roers, MD  Admit date: 04/17/2017 Discharge date: 04/21/2017  Admitted From:home Disposition:home    Recommendations for Outpatient Follow-up:  1. Follow up with PCP in 1-2 weeks 2. Please obtain BMP/CBC in one week  Home Health:no Equipment/Devices:no Discharge Condition:stable CODE STATUS:full code Diet recommendation:heart healthy  Brief/Interim Summary: 41 year old male with history of past, hypertension, GERD, obesity presented with nausea vomiting and abdominal pain for 3 days. Found to have uncomplicated acute pancreatitis.  # Acute uncomplicated pancreatitis: Unknown etiology -Denied alcohol use, no biliary stone or dilatation. TG not elevated. Denied any herbal medications CT scan reviewed. Clinically improved. He denies nausea vomiting and abdominal pain. Able to tolerate diet well. I discussed with the patient at bedside regarding outpatient follow-up with GI. Referral to GI made on discharge. Patient was concerned about job and letter from the hospital. Evaluated by general surgery in the hospital.  #Hypertension: Continue lisinopril. Monitor blood pressure  #GERD: Continue Protonix  #History of mild intermittent asthma: Stable. Continue Flonase  I discussed with the patient and multiple family members at bedside. He is stable and able to tolerate diet well. Recommend outpatient follow-up.  Discharge Diagnoses:  Principal Problem:   Pancreatitis Active Problems:   Moderate persistent asthma   Essential hypertension   Epigastric pain    Discharge Instructions  Discharge Instructions    Ambulatory referral to Gastroenterology    Complete by:  As directed    What is the reason for referral?:  Other Comment - pancreatitis   Call MD for:  difficulty breathing, headache or visual disturbances    Complete by:  As directed    Call MD for:  extreme fatigue     Complete by:  As directed    Call MD for:  hives    Complete by:  As directed    Call MD for:  persistant dizziness or light-headedness    Complete by:  As directed    Call MD for:  persistant nausea and vomiting    Complete by:  As directed    Call MD for:  severe uncontrolled pain    Complete by:  As directed    Call MD for:  temperature >100.4    Complete by:  As directed    Diet - low sodium heart healthy    Complete by:  As directed    Discharge instructions    Complete by:  As directed    Follow-up with her PCP in 1 week. Please follow up with GI in 2-3 weeks. Low-fat diet.   Increase activity slowly    Complete by:  As directed      Allergies as of 04/21/2017   No Known Allergies     Medication List    TAKE these medications   Albuterol Sulfate 108 (90 Base) MCG/ACT Aepb Commonly known as:  PROAIR RESPICLICK Inhale 2 puffs into the lungs every 4 (four) hours as needed.   budesonide-formoterol 160-4.5 MCG/ACT inhaler Commonly known as:  SYMBICORT Inhale two puffs twice daily to prevent cough or wheeze. Rinse, gargle, and spit after use.   cetirizine 10 MG tablet Commonly known as:  ZYRTEC Take 10 mg by mouth daily.   lisinopril 10 MG tablet Commonly known as:  PRINIVIL,ZESTRIL Take 10 mg by mouth daily.   NASACORT ALLERGY 24HR NA Place 1 spray into the nose daily.   omeprazole 20 MG capsule Commonly known as:  PRILOSEC Take 20  mg by mouth 2 (two) times daily before a meal.   sucralfate 1 g tablet Commonly known as:  CARAFATE Take 1 g by mouth 4 (four) times daily.   traMADol 50 MG tablet Commonly known as:  ULTRAM Take 50 mg by mouth 4 (four) times daily as needed.            Discharge Care Instructions        Start     Ordered   04/21/17 0000  Increase activity slowly     04/21/17 1132   04/21/17 0000  Diet - low sodium heart healthy     04/21/17 1132   04/21/17 0000  Discharge instructions    Comments:  Follow-up with her PCP in 1  week. Please follow up with GI in 2-3 weeks. Low-fat diet.   04/21/17 1132   04/21/17 0000  Call MD for:  temperature >100.4     04/21/17 1132   04/21/17 0000  Call MD for:  persistant nausea and vomiting     04/21/17 1132   04/21/17 0000  Call MD for:  severe uncontrolled pain     04/21/17 1132   04/21/17 0000  Call MD for:  difficulty breathing, headache or visual disturbances     04/21/17 1132   04/21/17 0000  Call MD for:  hives     04/21/17 1132   04/21/17 0000  Call MD for:  persistant dizziness or light-headedness     04/21/17 1132   04/21/17 0000  Call MD for:  extreme fatigue     04/21/17 1132   04/21/17 0000  Ambulatory referral to Gastroenterology    Question:  What is the reason for referral?  Answer:  Other  Comment:  pancreatitis   04/21/17 1132     Follow-up Information    Little, Jeneen Rinks, MD. Schedule an appointment as soon as possible for a visit in 1 week(s).   Specialty:  Family Medicine Contact information: [address] [place] [address] [phone]          No Known Allergies  Consultations: Surgery  Procedures/Studies: None  Subjective: Seen and examined at bedside. Denied headache, dizziness, nausea, vomiting, chest pain, shortness of breath, abdominal pain. Tolerating diet well.  Discharge Exam: Vitals:   04/21/17 0533 04/21/17 0912  BP: 134/67   Pulse: 72   Resp: 18   Temp: 98.8 F (37.1 C)   SpO2:  92%   Vitals:   04/20/17 1518 04/20/17 2117 04/21/17 0533 04/21/17 0912  BP: (!) 149/78 (!) 127/48 134/67   Pulse: 80 80 72   Resp: 18 18 18    Temp: (!) 97.5 F (36.4 C) 99.7 F (37.6 C) 98.8 F (37.1 C)   TempSrc: Oral Oral Oral   SpO2: 100% 98%  92%  Weight:      Height:        General: Pt is alert, awake, not in acute distress Cardiovascular: RRR, S1/S2 +, no rubs, no gallops Respiratory: CTA bilaterally, no wheezing, no rhonchi Abdominal: Soft, NT, ND, bowel sounds + Extremities: no edema, no cyanosis    The  results of significant diagnostics from this hospitalization (including imaging, microbiology, ancillary and laboratory) are listed below for reference.     Microbiology: No results found for this or any previous visit (from the past 240 hour(s)).   Labs: BNP (last 3 results) No results for input(s): BNP in the last 8760 hours. Basic Metabolic Panel:  Recent Labs Lab 04/17/17 1313 04/18/17 0336  04/19/17 0459  NA 136 136 135  K 3.8 3.7 3.6  CL 100* 102 100*  CO2 26 28 27   GLUCOSE 116* 115* 98  BUN 13 11 8   CREATININE 0.85 0.83 0.77  CALCIUM 9.2 8.6* 8.4*   Liver Function Tests:  Recent Labs Lab 04/17/17 1313 04/18/17 0336 04/19/17 0459  AST 21 15 15   ALT 20 17 15*  ALKPHOS 73 58 54  BILITOT 1.2 1.0 1.2  PROT 7.5 6.4* 6.3*  ALBUMIN 3.9 3.3* 3.0*    Recent Labs Lab 04/17/17 1313 04/19/17 0459  LIPASE 103* 29   No results for input(s): AMMONIA in the last 168 hours. CBC:  Recent Labs Lab 04/17/17 1313 04/18/17 0336 04/19/17 0459 04/20/17 0442  WBC 15.3* 14.6* 14.5* 11.2*  HGB 15.3 13.4 12.5* 12.3*  HCT 45.4 41.8 39.0 39.2  MCV 84.4 85.7 85.9 87.1  PLT 151 136* 113* 133*   Cardiac Enzymes: No results for input(s): CKTOTAL, CKMB, CKMBINDEX, TROPONINI in the last 168 hours. BNP: Invalid input(s): POCBNP CBG: No results for input(s): GLUCAP in the last 168 hours. D-Dimer No results for input(s): DDIMER in the last 72 hours. Hgb A1c No results for input(s): HGBA1C in the last 72 hours. Lipid Profile No results for input(s): CHOL, HDL, LDLCALC, TRIG, CHOLHDL, LDLDIRECT in the last 72 hours. Thyroid function studies No results for input(s): TSH, T4TOTAL, T3FREE, THYROIDAB in the last 72 hours.  Invalid input(s): FREET3 Anemia work up No results for input(s): VITAMINB12, FOLATE, FERRITIN, TIBC, IRON, RETICCTPCT in the last 72 hours. Urinalysis    Component Value Date/Time   COLORURINE YELLOW 04/17/2017 1318   APPEARANCEUR CLEAR 04/17/2017 1318    LABSPEC 1.023 04/17/2017 1318   PHURINE 8.0 04/17/2017 1318   GLUCOSEU NEGATIVE 04/17/2017 1318   HGBUR NEGATIVE 04/17/2017 1318   BILIRUBINUR NEGATIVE 04/17/2017 1318   KETONESUR 5 (A) 04/17/2017 1318   PROTEINUR 100 (A) 04/17/2017 1318   NITRITE NEGATIVE 04/17/2017 1318   LEUKOCYTESUR NEGATIVE 04/17/2017 1318   Sepsis Labs Invalid input(s): PROCALCITONIN,  WBC,  LACTICIDVEN Microbiology No results found for this or any previous visit (from the past 240 hour(s)).   Time coordinating discharge: 31 minutes  SIGNED:   Rosita Fire, MD  [facility] 04/21/2017, 11:34 AM  If 7PM-7AM, please contact night-coverage [email or web address] Password [email or web address]

## 2017-05-04 ENCOUNTER — Ambulatory Visit: Payer: Managed Care, Other (non HMO) | Admitting: Physician Assistant

## 2017-05-09 ENCOUNTER — Ambulatory Visit (INDEPENDENT_AMBULATORY_CARE_PROVIDER_SITE_OTHER): Payer: Managed Care, Other (non HMO) | Admitting: Physician Assistant

## 2017-05-09 ENCOUNTER — Encounter: Payer: Self-pay | Admitting: Physician Assistant

## 2017-05-09 VITALS — BP 146/100 | HR 76 | Ht 67.25 in | Wt 291.1 lb

## 2017-05-09 DIAGNOSIS — K859 Acute pancreatitis without necrosis or infection, unspecified: Secondary | ICD-10-CM

## 2017-05-09 NOTE — Progress Notes (Signed)
I agree with the above note, plan 

## 2017-05-09 NOTE — Progress Notes (Signed)
Chief Complaint: Acute uncomplicated pancreatitis  HPI:  [name] is a 41 year old Caucasian male with a past medical history as listed below,  who was referred to me by Tamsen Roers, MD for hospital follow up from acute uncomplicated pancreatitis .      Patient was admitted to the hospital from 04/17/17-04/21/17 for acute uncomplicated pancreatitis. Patient denied alcohol use. Ultrasound was unrevealing other than gallbladder sludge and fatty appearing liver, triglycerides were not elevated and patient denied any herbal medications. CT showed uncomplicated acute pancreatitis with no pancreatic pseudocyst or pancreatic necrosis. Small amount of free fluid tracked in the pericolic gutters. Hepatic steatosis and nonobstructing left nephrolithiasis. Lipase at time of admission was 103 and decreased to 29 two weeks ago. White count was initially elevated at 15.3 decreased down to 11.2 at time of discharge.    Today, the patient presents to clinic accompanied by his wife and explains that he is completely better. Patient tells me that he does not smoke or drink alcohol and has no family history of pancreatitis. Patient does note that he does eat "quite poorly". He tells me he was eating out at [place] or [employer] every morning for the past few months, but since this has occurred patient has started eating oatmeal and bananas in the morning and trying to do better with his diet throughout the day. Patient denies any continued complaints and is hoping that this never happens again.   Patient denies fever, chills, blood in the stool, melena, weight loss, fatigue, anorexia, nausea, vomiting, heartburn, reflux or symptoms that awaken him at night.  Past Medical History:  Diagnosis Date  . Asthma   . HTN (hypertension)   . Pancreatitis     Past Surgical History:  Procedure Laterality Date  . LUMBAR DISC SURGERY  2002    Current Outpatient Prescriptions  Medication Sig Dispense Refill  .  Albuterol Sulfate (PROAIR RESPICLICK) 161 (90 Base) MCG/ACT AEPB Inhale 2 puffs into the lungs every 4 (four) hours as needed. 1 each 1  . budesonide-formoterol (SYMBICORT) 160-4.5 MCG/ACT inhaler Inhale two puffs twice daily to prevent cough or wheeze. Rinse, gargle, and spit after use. 1 Inhaler 5  . cetirizine (ZYRTEC) 10 MG tablet Take 10 mg by mouth daily.    Marland Kitchen lisinopril (PRINIVIL,ZESTRIL) 10 MG tablet Take 10 mg by mouth daily.    Marland Kitchen omeprazole (PRILOSEC) 20 MG capsule Take 20 mg by mouth 2 (two) times daily before a meal.     . Triamcinolone Acetonide (NASACORT ALLERGY 24HR NA) Place 1 spray into the nose daily.     No current facility-administered medications for this visit.     Allergies as of 05/09/2017  . (No Known Allergies)    Family History  Problem Relation Age of Onset  . Asthma Brother   . Diabetes Father   . Hypertension Father   . Diabetes Paternal Grandmother   . Hypertension Paternal Grandmother   . Migraines Mother   . GER disease Mother   . Pancreatic cancer Maternal Grandmother   . Heart disease Maternal Grandfather     Social History   Social History  . Marital status: Married    Spouse name: N/A  . Number of children: 1  . Years of education: N/A   Occupational History  . delivery driver    Social History Main Topics  . Smoking status: Never Smoker  . Smokeless tobacco: Never Used  . Alcohol use Yes     Comment: rarely 2-3 times a  year  . Drug use: No  . Sexual activity: Not on file   Other Topics Concern  . Not on file   Social History Narrative  . No narrative on file    Review of Systems:    Constitutional: No weight loss, fever or chills Skin: No rash Cardiovascular: No chest pain Respiratory: No SOB Gastrointestinal: See HPI and otherwise negative Genitourinary: No dysuria  Neurological: No headache Musculoskeletal: No new muscle or joint pain Hematologic: No bleeding  Psychiatric: No history of depression or anxiety    Physical Exam:  Vital signs: BP (!) 146/100 (BP Location: Left Arm, Patient Position: Sitting, Cuff Size: Normal)   Pulse 76   Ht 5' 7.25" (1.708 m) Comment: height measured without shoes  Wt 291 lb 2 oz (132.1 kg)   BMI 45.26 kg/m   Constitutional:   Pleasant obese Caucasian male appears to be in NAD, Well developed, Well nourished, alert and cooperative Head:  Normocephalic and atraumatic. Eyes:   PEERL, EOMI. No icterus. Conjunctiva pink. Ears:  Normal auditory acuity. Neck:  Supple Throat: Oral cavity and pharynx without inflammation, swelling or lesion.  Respiratory: Respirations even and unlabored. Lungs clear to auscultation bilaterally.   No wheezes, crackles, or rhonchi.  Cardiovascular: Normal S1, S2. No MRG. Regular rate and rhythm. No peripheral edema, cyanosis or pallor.  Gastrointestinal:  Soft, nondistended, nontender. No rebound or guarding. Normal bowel sounds. No appreciable masses or hepatomegaly. Rectal:  Not performed.  Msk:  Symmetrical without gross deformities. Without edema, no deformity or joint abnormality.  Neurologic:  Alert and  oriented x4;  grossly normal neurologically.  Skin:   Dry and intact without significant lesions or rashes. Psychiatric: Demonstrates good judgement and reason without abnormal affect or behaviors.  RELEVANT LABS AND IMAGING: CBC    Component Value Date/Time   WBC 11.2 (H) 04/20/2017 0442   RBC 4.50 04/20/2017 0442   HGB 12.3 (L) 04/20/2017 0442   HCT 39.2 04/20/2017 0442   PLT 133 (L) 04/20/2017 0442   MCV 87.1 04/20/2017 0442   MCH 27.3 04/20/2017 0442   MCHC 31.4 04/20/2017 0442   RDW 13.3 04/20/2017 0442    CMP     Component Value Date/Time   NA 135 04/19/2017 0459   K 3.6 04/19/2017 0459   CL 100 (L) 04/19/2017 0459   CO2 27 04/19/2017 0459   GLUCOSE 98 04/19/2017 0459   BUN 8 04/19/2017 0459   CREATININE 0.77 04/19/2017 0459   CALCIUM 8.4 (L) 04/19/2017 0459   PROT 6.3 (L) 04/19/2017 0459   ALBUMIN 3.0 (L)  04/19/2017 0459   AST 15 04/19/2017 0459   ALT 15 (L) 04/19/2017 0459   ALKPHOS 54 04/19/2017 0459   BILITOT 1.2 04/19/2017 0459   GFRNONAA >60 04/19/2017 0459   GFRAA >60 04/19/2017 0459   ULTRASOUND ABDOMEN LIMITED RIGHT UPPER QUADRANT 04/17/17  COMPARISON:  None.  FINDINGS: Gallbladder:  No gallstones or wall thickening visualized. No pericholecystic fluid is identified. Layering biliary sludge is noted. No sonographic Murphy sign noted by sonographer.  Common bile duct:  Diameter: 3.9 mm and within normal limits.  Liver:  No focal lesion identified. The liver is increased in echogenicity consistent with hepatic steatosis. Portal vein is patent on color Doppler imaging with normal direction of blood flow towards the liver.  IMPRESSION: 1. Fatty appearing liver. 2. Biliary sludge without secondary signs of acute cholecystitis.   Electronically Signed   By: Ashley Royalty M.D.   On: 04/17/2017 22:14  EXAM: CT ABDOMEN AND PELVIS WITH CONTRAST 04/19/17  TECHNIQUE: Multidetector CT imaging of the abdomen and pelvis was performed using the standard protocol following bolus administration of intravenous contrast.  CONTRAST:  163m ISOVUE-300 IOPAMIDOL (ISOVUE-300) INJECTION 61%  COMPARISON:  Abdominal ultrasound 04/17/2017  FINDINGS: Lower chest: Hypoventilatory changes in atelectasis at the lung bases. No pleural fluid.  Hepatobiliary: The liver is prominent size spanning 20 cm cranial caudal with diffusely decreased hepatic density. No evidence of focal hepatic lesion. Gallbladder physiologically distended, no calcified stone. No biliary dilatation.  Pancreas: Moderate peripancreatic fat stranding about the pancreatic head. No ductal dilatation. Homogeneous enhancement without necrosis. No peripancreatic fluid collection. Prominent peripancreatic nodes are likely reactive.  Spleen: Borderline enlarged spanning 13.5 cm.  No focal  lesion.  Adrenals/Urinary Tract: Normal adrenal glands.  4 mm nonobstructing stone in the upper left kidney. No hydronephrosis. No perinephric edema. Homogeneous enhancement with symmetric excretion on delayed phase imaging. Subcentimeter hypodensity in the upper left kidney is too small to characterize but likely small cyst. No focal lesion in the right kidney.  Urinary bladder is physiologically distended. No bladder wall thickening.  Stomach/Bowel: Stomach physiologically distended. Mild duodenal wall thickening which is likely reactive. Small bowel is otherwise nondistended without small bowel inflammation. No obstruction, enteric contrast reaches the colon. Normal appendix. Moderate colonic stool burden. No colonic inflammatory change.  Vascular/Lymphatic: Small peripancreatic and retroperitoneal nodes are likely reactive. Circumaortic left renal vein. Normal caliber abdominal aorta.  Reproductive: Prostate is unremarkable.  Other: Small amount of free fluid tracking in both pericolic gutters in the pelvis. New free air. No loculated abscess. Fat within both inguinal canals. Small fat containing umbilical hernia.  Musculoskeletal: There are no acute or suspicious osseous abnormalities. Degenerative change in the lower thoracic spine with Schmorl's nodes and vacuum phenomenon.  IMPRESSION: 1. Uncomplicated acute pancreatitis. No pancreatic pseudocyst or pancreatic necrosis. Small amount of free fluid tracks in the pericolic gutters. 2. Hepatic steatosis. 3. Nonobstructing left nephrolithiasis.   Electronically Signed   By: MJeb LeveringM.D.   On: 04/19/2017 01:44  Assessment: 1. Acute uncomplicated pancreatitis: No history of alcohol use, no smoking, normal triglycerides, ultrasound and biliary sludge; discussed with the patient that this could have represented a viral cause, microlithiasis or was idiopathic  Plan: 1. Discussed with the patient and  his wife that there is no need for further workup at this time. If he has recurrent pancreatitis could consider MRCP and/or other imaging/testing. 2. Encouraged the patient to continue his abstinence from alcohol 3. Encouraged the patient to remain on his healthy diet as this will help him with his health overall 4. Patient to return to clinic with [place] needed. He was assigned to Dr. JArdis Hughstoday.  JEllouise Newer PA-C [facility] 05/09/2017, 10:48 AM  Cc: LTamsen Roers MD

## 2017-07-24 ENCOUNTER — Other Ambulatory Visit: Payer: Self-pay | Admitting: Allergy and Immunology

## 2017-07-27 ENCOUNTER — Other Ambulatory Visit: Payer: Self-pay

## 2017-07-27 MED ORDER — ALBUTEROL SULFATE 108 (90 BASE) MCG/ACT IN AEPB: 2.0000 | INHALATION_SPRAY | RESPIRATORY_TRACT | 0 refills | Status: DC

## 2017-09-11 ENCOUNTER — Ambulatory Visit: Payer: Self-pay

## 2017-09-11 ENCOUNTER — Encounter: Payer: Self-pay | Admitting: Podiatry

## 2017-09-11 ENCOUNTER — Ambulatory Visit: Payer: Managed Care, Other (non HMO) | Admitting: Podiatry

## 2017-09-11 ENCOUNTER — Other Ambulatory Visit: Payer: Self-pay | Admitting: Podiatry

## 2017-09-11 ENCOUNTER — Ambulatory Visit (INDEPENDENT_AMBULATORY_CARE_PROVIDER_SITE_OTHER): Payer: Managed Care, Other (non HMO)

## 2017-09-11 DIAGNOSIS — M722 Plantar fascial fibromatosis: Secondary | ICD-10-CM

## 2017-09-11 DIAGNOSIS — M79671 Pain in right foot: Secondary | ICD-10-CM | POA: Diagnosis not present

## 2017-09-11 MED ORDER — TRIAMCINOLONE ACETONIDE 10 MG/ML IJ SUSP
10.0000 mg | Freq: Once | INTRAMUSCULAR | Status: AC
  Administered 2017-09-11: 10 mg

## 2017-09-11 MED ORDER — PREDNISONE 10 MG PO TABS: ORAL_TABLET | ORAL | 0 refills | Status: DC

## 2017-09-11 NOTE — Patient Instructions (Signed)
Plantar Fasciitis (Heel Spur Syndrome) with Rehab The plantar fascia is a fibrous, ligament-like, soft-tissue structure that spans the bottom of the foot. Plantar fasciitis is a condition that causes pain in the foot due to inflammation of the tissue. SYMPTOMS   Pain and tenderness on the underneath side of the foot.  Pain that worsens with standing or walking. CAUSES  Plantar fasciitis is caused by irritation and injury to the plantar fascia on the underneath side of the foot. Common mechanisms of injury include:  Direct trauma to bottom of the foot.  Damage to a small nerve that runs under the foot where the main fascia attaches to the heel bone.  Stress placed on the plantar fascia due to bone spurs. RISK INCREASES WITH:   Activities that place stress on the plantar fascia (running, jumping, pivoting, or cutting).  Poor strength and flexibility.  Improperly fitted shoes.  Tight calf muscles.  Flat feet.  Failure to warm-up properly before activity.  Obesity. PREVENTION  Warm up and stretch properly before activity.  Allow for adequate recovery between workouts.  Maintain physical fitness:  Strength, flexibility, and endurance.  Cardiovascular fitness.  Maintain a health body weight.  Avoid stress on the plantar fascia.  Wear properly fitted shoes, including arch supports for individuals who have flat feet.  PROGNOSIS  If treated properly, then the symptoms of plantar fasciitis usually resolve without surgery. However, occasionally surgery is necessary.  RELATED COMPLICATIONS   Recurrent symptoms that may result in a chronic condition.  Problems of the lower back that are caused by compensating for the injury, such as limping.  Pain or weakness of the foot during push-off following surgery.  Chronic inflammation, scarring, and partial or complete fascia tear, occurring more often from repeated injections.  TREATMENT  Treatment initially involves the  use of ice and medication to help reduce pain and inflammation. The use of strengthening and stretching exercises may help reduce pain with activity, especially stretches of the Achilles tendon. These exercises may be performed at home or with a therapist. Your caregiver may recommend that you use heel cups of arch supports to help reduce stress on the plantar fascia. Occasionally, corticosteroid injections are given to reduce inflammation. If symptoms persist for greater than 6 months despite non-surgical (conservative), then surgery may be recommended.   MEDICATION   If pain medication is necessary, then nonsteroidal anti-inflammatory medications, such as aspirin and ibuprofen, or other minor pain relievers, such as acetaminophen, are often recommended.  Do not take pain medication within 7 days before surgery.  Prescription pain relievers may be given if deemed necessary by your caregiver. Use only as directed and only as much as you need.  Corticosteroid injections may be given by your caregiver. These injections should be reserved for the most serious cases, because they may only be given a certain number of times.  HEAT AND COLD  Cold treatment (icing) relieves pain and reduces inflammation. Cold treatment should be applied for 10 to 15 minutes every 2 to 3 hours for inflammation and pain and immediately after any activity that aggravates your symptoms. Use ice packs or massage the area with a piece of ice (ice massage).  Heat treatment may be used prior to performing the stretching and strengthening activities prescribed by your caregiver, physical therapist, or athletic trainer. Use a heat pack or soak the injury in warm water.  SEEK IMMEDIATE MEDICAL CARE IF:  Treatment seems to offer no benefit, or the condition worsens.  Any medications   produce adverse side effects.  EXERCISES- RANGE OF MOTION (ROM) AND STRETCHING EXERCISES - Plantar Fasciitis (Heel Spur Syndrome) These exercises  may help you when beginning to rehabilitate your injury. Your symptoms may resolve with or without further involvement from your physician, physical therapist or athletic trainer. While completing these exercises, remember:   Restoring tissue flexibility helps normal motion to return to the joints. This allows healthier, less painful movement and activity.  An effective stretch should be held for at least 30 seconds.  A stretch should never be painful. You should only feel a gentle lengthening or release in the stretched tissue.  RANGE OF MOTION - Toe Extension, Flexion  Sit with your right / left leg crossed over your opposite knee.  Grasp your toes and gently pull them back toward the top of your foot. You should feel a stretch on the bottom of your toes and/or foot.  Hold this stretch for 10 seconds.  Now, gently pull your toes toward the bottom of your foot. You should feel a stretch on the top of your toes and or foot.  Hold this stretch for 10 seconds. Repeat  times. Complete this stretch 3 times per day.   RANGE OF MOTION - Ankle Dorsiflexion, Active Assisted  Remove shoes and sit on a chair that is preferably not on a carpeted surface.  Place right / left foot under knee. Extend your opposite leg for support.  Keeping your heel down, slide your right / left foot back toward the chair until you feel a stretch at your ankle or calf. If you do not feel a stretch, slide your bottom forward to the edge of the chair, while still keeping your heel down.  Hold this stretch for 10 seconds. Repeat 3 times. Complete this stretch 2 times per day.   STRETCH  Gastroc, Standing  Place hands on wall.  Extend right / left leg, keeping the front knee somewhat bent.  Slightly point your toes inward on your back foot.  Keeping your right / left heel on the floor and your knee straight, shift your weight toward the wall, not allowing your back to arch.  You should feel a gentle stretch  in the right / left calf. Hold this position for 10 seconds. Repeat 3 times. Complete this stretch 2 times per day.  STRETCH  Soleus, Standing  Place hands on wall.  Extend right / left leg, keeping the other knee somewhat bent.  Slightly point your toes inward on your back foot.  Keep your right / left heel on the floor, bend your back knee, and slightly shift your weight over the back leg so that you feel a gentle stretch deep in your back calf.  Hold this position for 10 seconds. Repeat 3 times. Complete this stretch 2 times per day.  STRETCH  Gastrocsoleus, Standing  Note: This exercise can place a lot of stress on your foot and ankle. Please complete this exercise only if specifically instructed by your caregiver.   Place the ball of your right / left foot on a step, keeping your other foot firmly on the same step.  Hold on to the wall or a rail for balance.  Slowly lift your other foot, allowing your body weight to press your heel down over the edge of the step.  You should feel a stretch in your right / left calf.  Hold this position for 10 seconds.  Repeat this exercise with a slight bend in your right /   left knee. Repeat 3 times. Complete this stretch 2 times per day.   STRENGTHENING EXERCISES - Plantar Fasciitis (Heel Spur Syndrome)  These exercises may help you when beginning to rehabilitate your injury. They may resolve your symptoms with or without further involvement from your physician, physical therapist or athletic trainer. While completing these exercises, remember:   Muscles can gain both the endurance and the strength needed for everyday activities through controlled exercises.  Complete these exercises as instructed by your physician, physical therapist or athletic trainer. Progress the resistance and repetitions only as guided.  STRENGTH - Towel Curls  Sit in a chair positioned on a non-carpeted surface.  Place your foot on a towel, keeping your heel  on the floor.  Pull the towel toward your heel by only curling your toes. Keep your heel on the floor. Repeat 3 times. Complete this exercise 2 times per day.  STRENGTH - Ankle Inversion  Secure one end of a rubber exercise band/tubing to a fixed object (table, pole). Loop the other end around your foot just before your toes.  Place your fists between your knees. This will focus your strengthening at your ankle.  Slowly, pull your big toe up and in, making sure the band/tubing is positioned to resist the entire motion.  Hold this position for 10 seconds.  Have your muscles resist the band/tubing as it slowly pulls your foot back to the starting position. Repeat 3 times. Complete this exercises 2 times per day.  Document Released: 07/25/2005 Document Revised: 10/17/2011 Document Reviewed: 11/06/2008 [employer] Patient Information 2014 [employer].  

## 2017-09-13 NOTE — Progress Notes (Signed)
Subjective:   Patient ID: [name], male   DOB: 42 y.o.   MRN: [id number]   HPI Patient stated having quite a bit of discomfort plantar aspect right heel with moderate in the left and states is worse when he gets up in the morning or after periods of sitting   ROS      Objective:  Physical Exam  Neurovascular status intact with patient noted to have acute inflammation plantar aspect right heel at the insertional point tendon into the calcaneus with mild pain in the left.  Patient is noted to have good digital perfusion and is well oriented x3 acute     Assessment:  Acute plantar fasciitis of the heel right with inflammation also noted left worse when getting up in the morning after periods of sitting F2 H&P x-ray reviewed and injected the plantar fascia 3 mg Kenalog pyelogram Xylocaine and dispensed night splint with all instructions on usage.  Reappoint to recheck again in the next several weeks  X-rays indicate that there is spur formation with no indication of stress fracture arthritis     Plan:  Above discussed is the entire condition

## 2017-09-16 ENCOUNTER — Emergency Department (HOSPITAL_COMMUNITY): Payer: Managed Care, Other (non HMO)

## 2017-09-16 ENCOUNTER — Emergency Department (HOSPITAL_COMMUNITY)
Admission: EM | Admit: 2017-09-16 | Discharge: 2017-09-16 | Disposition: A | Discharge status: Home / Self Care | Payer: Managed Care, Other (non HMO) | Attending: Emergency Medicine | Admitting: Emergency Medicine

## 2017-09-16 ENCOUNTER — Encounter (HOSPITAL_COMMUNITY): Payer: Self-pay | Admitting: Emergency Medicine

## 2017-09-16 DIAGNOSIS — I10 Essential (primary) hypertension: Secondary | ICD-10-CM | POA: Insufficient documentation

## 2017-09-16 DIAGNOSIS — J45909 Unspecified asthma, uncomplicated: Secondary | ICD-10-CM | POA: Diagnosis not present

## 2017-09-16 DIAGNOSIS — Z79899 Other long term (current) drug therapy: Secondary | ICD-10-CM | POA: Insufficient documentation

## 2017-09-16 DIAGNOSIS — M79605 Pain in left leg: Secondary | ICD-10-CM | POA: Diagnosis present

## 2017-09-16 DIAGNOSIS — M5432 Sciatica, left side: Secondary | ICD-10-CM | POA: Insufficient documentation

## 2017-09-16 MED ORDER — KETOROLAC TROMETHAMINE 60 MG/2ML IM SOLN
30.0000 mg | Freq: Once | INTRAMUSCULAR | Status: AC
  Administered 2017-09-16: 30 mg via INTRAMUSCULAR
  Filled 2017-09-16: qty 2

## 2017-09-16 MED ORDER — DICLOFENAC SODIUM 50 MG PO TBEC: 50.0000 mg | DELAYED_RELEASE_TABLET | Freq: Two times a day (BID) | ORAL | 0 refills | Status: DC

## 2017-09-16 MED ORDER — CYCLOBENZAPRINE HCL 10 MG PO TABS: 10.0000 mg | ORAL_TABLET | Freq: Two times a day (BID) | ORAL | 0 refills | Status: DC | PRN

## 2017-09-16 MED ORDER — ONDANSETRON 4 MG PO TBDP
4.0000 mg | ORAL_TABLET | Freq: Once | ORAL | Status: AC
  Administered 2017-09-16: 4 mg via ORAL
  Filled 2017-09-16: qty 1

## 2017-09-16 MED ORDER — OXYCODONE-ACETAMINOPHEN 5-325 MG PO TABS
1.0000 | ORAL_TABLET | Freq: Once | ORAL | Status: AC
  Administered 2017-09-16: 1 via ORAL
  Filled 2017-09-16: qty 1

## 2017-09-16 MED ORDER — CYCLOBENZAPRINE HCL 10 MG PO TABS
10.0000 mg | ORAL_TABLET | Freq: Once | ORAL | Status: AC
  Administered 2017-09-16: 10 mg via ORAL
  Filled 2017-09-16: qty 1

## 2017-09-16 NOTE — ED Notes (Signed)
Scanner not readable on patient tried x 3.  Verified with charge nurse, Roselyn Reef, RN

## 2017-09-16 NOTE — ED Triage Notes (Signed)
Reports intermittent pain in left leg from hip to knee for the last year.  Noted after working yesterday pain is severe in nature.  Difficult to walk due to the pain.  Has tried aspirin, ibuprofen, and heat with no relief reported.

## 2017-09-16 NOTE — ED Notes (Signed)
Pt. Stated, Medicine did not work at all, pain still there.

## 2017-09-16 NOTE — ED Provider Notes (Signed)
[facility] EMERGENCY DEPARTMENT Provider Note   CSN: [id number] Arrival date & time: 09/16/17  0603     History   Chief Complaint Chief Complaint  Patient presents with  . Leg Pain    HPI [name] COMP is a 42 y.o. male who presents to the ED with left leg pain that starts at the hip and goes to the knee. The pain has been intermittent for the past year. Patient has taken OTC pain medication without relief. This episode started yesterday after he had been ridding on a machine at work for a long period of time. Patient has hx of plantar fascitis and heel spurs. Patient just started a prednisone taper for his feet problems. Patient reports that he fell on his left knee 3 weeks ago and has continued left knee pain since that time.   HPI  Past Medical History:  Diagnosis Date  . Asthma   . HTN (hypertension)   . Pancreatitis     Patient Active Problem List   Diagnosis Date Noted  . Epigastric pain   . Pancreatitis 04/17/2017  . Essential hypertension 04/17/2017  . Moderate persistent asthma 07/21/2015  . Allergic rhinoconjunctivitis 07/21/2015  . Esophageal reflux 07/21/2015  . ABNORMALITY OF GAIT 11/12/2009  . TARSAL TUNNEL SYNDROME, LEFT 10/08/2009  . PES PLANUS 10/08/2009    Past Surgical History:  Procedure Laterality Date  . LUMBAR [facility]C SURGERY  2002       Home Medications    Prior to Admission medications   Medication Sig Start Date End Date Taking? Authorizing Provider  Albuterol Sulfate (PROAIR RESPICLICK) 063 (90 Base) MCG/ACT AEPB Inhale 2 puffs into the lungs every 4 (four) hours. 07/27/17   Kozlow, Donnamarie Poag, MD  budesonide-formoterol [facility]) 160-4.5 MCG/ACT inhaler Inhale two puffs twice daily to prevent cough or wheeze. Rinse, gargle, and spit after use. 12/06/16   Kozlow, Donnamarie Poag, MD  cetirizine (ZYRTEC) 10 MG tablet Take 10 mg by mouth daily.    [provider]  cyclobenzaprine (FLEXERIL) 10 MG tablet Take 1 tablet (10 mg  total) by mouth 2 (two) times daily as needed for muscle spasms. 09/16/17   Ashley Murrain, NP  diclofenac (VOLTAREN) 50 MG EC tablet Take 1 tablet (50 mg total) by mouth 2 (two) times daily. 09/16/17   Ashley Murrain, NP  lisinopril (PRINIVIL,ZESTRIL) 10 MG tablet Take 10 mg by mouth daily. 04/16/17   [provider]  omeprazole (PRILOSEC) 20 MG capsule Take 20 mg by mouth 2 (two) times daily before a meal.     [provider]  predniSONE (DELTASONE) 10 MG tablet 12 day tapering dose 09/11/17   Wallene Huh, DPM  Triamcinolone Acetonide (NASACORT ALLERGY 24HR NA) Place 1 spray into the nose daily.    [provider]    Family History Family History  Problem Relation Age of Onset  . Asthma Brother   . Diabetes Father   . Hypertension Father   . Diabetes Paternal Grandmother   . Hypertension Paternal Grandmother   . Migraines Mother   . GER disease Mother   . Pancreatic cancer Maternal Grandmother   . Heart disease Maternal Grandfather     Social History Social History   Tobacco Use  . Smoking status: Never Smoker  . Smokeless tobacco: Never Used  Substance Use Topics  . Alcohol use: Yes    Comment: rarely 2-3 times a year  . Drug use: No     Allergies  Patient has no known allergies.   Review of Systems Review of Systems  Constitutional: Negative for fever.  HENT: Negative.   Respiratory: Negative for chest tightness and shortness of breath.   Cardiovascular: Negative for chest pain.  Gastrointestinal: Positive for nausea. Negative for abdominal pain and vomiting.  Genitourinary: Negative for dysuria, frequency and urgency.  Musculoskeletal: Positive for arthralgias.  Skin: Negative for wound.  Neurological: Negative for seizures, syncope and headaches.  Psychiatric/Behavioral: Negative for confusion.     Physical Exam Updated Vital Signs BP 125/61 (BP Location: Left Arm)   Pulse 98   Temp 98.4 F (36.9 C) (Oral)   Resp 18   Ht 5'  7.25" (1.708 m)   Wt 131.5 kg (290 lb)   SpO2 98%   BMI 45.08 kg/m   Physical Exam  Constitutional: He appears well-developed and well-nourished. No distress.  HENT:  Head: Normocephalic and atraumatic.  Eyes: EOM are normal.  Neck: Normal range of motion. Neck supple.  Cardiovascular: Normal rate and regular rhythm.  Pulmonary/Chest: Effort normal and breath sounds normal.  Abdominal: Soft. Bowel sounds are normal. There is no tenderness.  Musculoskeletal:       Left hip: He exhibits tenderness. He exhibits normal strength and no deformity. Decreased range of motion: due to pain.       Left knee: He exhibits no swelling, no deformity, no laceration, no erythema and normal alignment. Decreased range of motion: due to pain. Tenderness found.       Lumbar back: He exhibits tenderness.  Pedal pulses 2+, adequate circulation. Tender with palpation over left sciatic nerve.   Neurological: He is alert.  Skin: Skin is warm and dry.  Psychiatric: He has a normal mood and affect.  Nursing note and vitals reviewed.    ED Treatments / Results  Labs (all labs ordered are listed, but only abnormal results are displayed) Labs Reviewed - No data to display   Radiology Dg Knee Complete 4 Views Left  Result Date: 09/16/2017 CLINICAL DATA:  42 year old male with intermittent left lower extremity pain from the hip to the knee for the past year EXAM: LEFT KNEE - COMPLETE 4+ VIEW COMPARISON:  Concurrently obtained radiographs of the pelvis and left hip FINDINGS: No evidence of fracture, dislocation, or joint effusion. No evidence of arthropathy or other focal bone abnormality. Soft tissues are unremarkable. IMPRESSION: Negative. Electronically Signed   By: Jacqulynn Cadet M.D.   On: 09/16/2017 10:47   Dg Hip Unilat W Or Wo Pelvis 2-3 Views Left  Result Date: 09/16/2017 CLINICAL DATA:  42 year old male with left lower extremity pain from the hip to the knee for the past year EXAM: DG HIP (WITH OR  WITHOUT PELVIS) 2-3V LEFT COMPARISON:  Concurrently obtained radiographs of the left knee FINDINGS: There is no evidence of hip fracture or dislocation. There is no evidence of arthropathy or other focal bone abnormality. IMPRESSION: Negative. Electronically Signed   By: Jacqulynn Cadet M.D.   On: 09/16/2017 10:46    Procedures Procedures (including critical care time)  Medications Ordered in ED Medications  oxyCODONE-acetaminophen (PERCOCET/ROXICET) 5-325 MG per tablet 1 tablet (1 tablet Oral Given 09/16/17 0743)  oxyCODONE-acetaminophen (PERCOCET/ROXICET) 5-325 MG per tablet 1 tablet (1 tablet Oral Given 09/16/17 1019)  cyclobenzaprine (FLEXERIL) tablet 10 mg (10 mg Oral Given 09/16/17 1019)  ketorolac (TORADOL) injection 30 mg (30 mg Intramuscular Given 09/16/17 1126)  ondansetron (ZOFRAN-ODT) disintegrating tablet 4 mg (4 mg Oral Given 09/16/17 1215)   Patient reported feeling  really sleepy after injection of Toradol and reports that it helped the pain.   Initial Impression / Assessment and Plan / ED Course  I have reviewed the triage vital signs and the nursing notes. Patient with back pain.  No neurological deficits and normal neuro exam.  Patient can walk but states is painful.  No loss of bowel or bladder control.  No concern for cauda equina.  No fever, night sweats, weight loss, h/o cancer, IVDU.  RICE protocol and pain medicine indicated and discussed with patient. Left knee s/p injury 3 weeks ago. Will treat with NSAIDS and muscle relaxer. F/u with ortho.   Final Clinical Impressions(s) / ED Diagnoses   Final diagnoses:  Sciatica of left side    ED Discharge Orders        Ordered    cyclobenzaprine (FLEXERIL) 10 MG tablet  2 times daily PRN     09/16/17 1203    diclofenac (VOLTAREN) 50 MG EC tablet  2 times daily     09/16/17 [address] [place], [place] 09/16/17 1814    Hayden Rasmussen, MD 09/17/17 [phone]

## 2017-09-19 ENCOUNTER — Ambulatory Visit (INDEPENDENT_AMBULATORY_CARE_PROVIDER_SITE_OTHER): Payer: Managed Care, Other (non HMO)

## 2017-09-19 ENCOUNTER — Encounter (INDEPENDENT_AMBULATORY_CARE_PROVIDER_SITE_OTHER): Payer: Self-pay | Admitting: Orthopaedic Surgery

## 2017-09-19 ENCOUNTER — Ambulatory Visit (INDEPENDENT_AMBULATORY_CARE_PROVIDER_SITE_OTHER): Payer: Managed Care, Other (non HMO) | Admitting: Orthopaedic Surgery

## 2017-09-19 DIAGNOSIS — M5442 Lumbago with sciatica, left side: Secondary | ICD-10-CM | POA: Diagnosis not present

## 2017-09-19 DIAGNOSIS — G8929 Other chronic pain: Secondary | ICD-10-CM

## 2017-09-19 HISTORY — DX: Other chronic pain: G89.29

## 2017-09-19 NOTE — Progress Notes (Signed)
Office Visit Note   Patient: [name]           Date of Birth: 04-22-1976           MRN: [id number] Visit Date: 09/19/2017              Requested by: Tamsen Roers, [place], [place] [address] PCP: Tamsen Roers, MD   Assessment & Plan: Visit Diagnoses:  1. Chronic left-sided low back pain with left-sided sciatica     Plan: At this point, [name] is having severe lumbar radiculopathy.  He is currently on a steroid Dosepak which is of minimal relief.  We are going to go ahead and schedule an MRI of his lumbar spine.  He will follow-up with [place] once this is completed.  In the meantime, we will write him to return to work desk work only until his follow-up appointment after the MRI.  Follow-Up Instructions: Return for after MRI .   Orders:  Orders Placed This Encounter  Procedures  . XR Lumbar Spine 2-3 Views  . MR Lumbar Spine w/o contrast   No orders of the defined types were placed in this encounter.     Procedures: No procedures performed   Clinical Data: No additional findings.   Subjective: Chief Complaint  Patient presents with  . Lower Back - Pain  . Left Hip - Pain    HPI [name] is a pleasant 42 year old gentleman new patient who presents to our clinic today with left lower back and buttock pain.  This began approximately 4-5 months ago is intermittent pain that is progressively worsened.  He said this past Friday, 09/16/2017 when he was leaving work, he tripped and fell landing on his left knee.  His left knee was fine but since then he has had significant pain to the left lower back and buttock radiating down the posterior aspect of the left leg.  He describes his pain is constant sharp shooting pains worse with lying down.  Pain is somewhat alleviated standing up.  He does note tingling to all toes on the left side.  No bowel or bladder incontinence, no saddle paresthesias.  He was seen in the ED where x-rays of his knee and hip were obtained.  These  were negative for fracture or other acute findings.  His back was never x-rayed.  He was put on a steroid Dosepak which is really not helped very much.  Of note, he admits to having spine surgery back in 2002 with Dr. Kendal Hymen.  He has not been back to see him since onset of the new back pain.  Review of Systems as detailed in HPI.  All others reviewed and are negative.   2 seconds objective: Vital Signs: There were no vitals taken for this visit.  Physical Exam well-developed well-nourished gentleman no acute distress.  Alert and oriented x3.  Ortho Exam examination of his lumbar spine reveals no spinous tenderness.  Minimal paraspinous tenderness on the left.   markedly positive straight leg raise.  Increased pain with lumbar flexion greater than extension Specialty Comments:   No specialty comments available.  Imaging: Xr Lumbar Spine 2-3 Views  Result Date: 09/19/2017 X-rays of the lumbar spine reveal moderate degenerative disc disease at L5-S1    PMFS History: Patient Active Problem List   Diagnosis Date Noted  . Chronic left-sided low back pain with left-sided sciatica 09/19/2017  . Epigastric pain   . Pancreatitis 04/17/2017  . Essential hypertension  04/17/2017  . Moderate persistent asthma 07/21/2015  . Allergic rhinoconjunctivitis 07/21/2015  . Esophageal reflux 07/21/2015  . ABNORMALITY OF GAIT 11/12/2009  . TARSAL TUNNEL SYNDROME, LEFT 10/08/2009  . PES PLANUS 10/08/2009   Past Medical History:  Diagnosis Date  . Asthma   . HTN (hypertension)   . Pancreatitis     Family History  Problem Relation Age of Onset  . Asthma Brother   . Diabetes Father   . Hypertension Father   . Diabetes Paternal Grandmother   . Hypertension Paternal Grandmother   . Migraines Mother   . GER disease Mother   . Pancreatic cancer Maternal Grandmother   . Heart disease Maternal Grandfather     Past Surgical History:  Procedure Laterality Date  . LUMBAR [place] SURGERY  2002    Social History   Occupational History  . Occupation: delivery driver  Tobacco Use  . Smoking status: Never Smoker  . Smokeless tobacco: Never Used  Substance and Sexual Activity  . Alcohol use: Yes    Comment: rarely 2-3 times a year  . Drug use: No  . Sexual activity: Not on file

## 2017-09-24 ENCOUNTER — Inpatient Hospital Stay: Admission: RE | Admit: 2017-09-24 | Payer: Managed Care, Other (non HMO) | Source: Ambulatory Visit

## 2017-09-25 ENCOUNTER — Ambulatory Visit: Payer: Managed Care, Other (non HMO) | Admitting: Podiatry

## 2017-09-29 ENCOUNTER — Other Ambulatory Visit: Payer: Self-pay

## 2017-09-29 ENCOUNTER — Inpatient Hospital Stay (HOSPITAL_COMMUNITY): Payer: Managed Care, Other (non HMO) | Admitting: Certified Registered Nurse Anesthetist

## 2017-09-29 ENCOUNTER — Encounter (HOSPITAL_COMMUNITY): Payer: Self-pay

## 2017-09-29 ENCOUNTER — Inpatient Hospital Stay (HOSPITAL_COMMUNITY)
Admission: AD | Admit: 2017-09-29 | Discharge: 2017-10-02 | DRG: 511 | Disposition: A | Discharge status: Home Health | Payer: Managed Care, Other (non HMO) | Source: Ambulatory Visit | Attending: Orthopaedic Surgery | Admitting: Orthopaedic Surgery

## 2017-09-29 ENCOUNTER — Ambulatory Visit (INDEPENDENT_AMBULATORY_CARE_PROVIDER_SITE_OTHER): Payer: Managed Care, Other (non HMO)

## 2017-09-29 ENCOUNTER — Encounter (HOSPITAL_COMMUNITY)
Admission: AD | Disposition: A | Discharge status: Home Health | Payer: Self-pay | Source: Ambulatory Visit | Attending: Orthopaedic Surgery

## 2017-09-29 ENCOUNTER — Ambulatory Visit (INDEPENDENT_AMBULATORY_CARE_PROVIDER_SITE_OTHER): Payer: Managed Care, Other (non HMO) | Admitting: Orthopaedic Surgery

## 2017-09-29 DIAGNOSIS — Y990 Civilian activity done for income or pay: Secondary | ICD-10-CM | POA: Diagnosis not present

## 2017-09-29 DIAGNOSIS — M00011 Staphylococcal arthritis, right shoulder: Secondary | ICD-10-CM | POA: Diagnosis present

## 2017-09-29 DIAGNOSIS — Z978 Presence of other specified devices: Secondary | ICD-10-CM | POA: Diagnosis not present

## 2017-09-29 DIAGNOSIS — A499 Bacterial infection, unspecified: Secondary | ICD-10-CM

## 2017-09-29 DIAGNOSIS — J45909 Unspecified asthma, uncomplicated: Secondary | ICD-10-CM | POA: Diagnosis present

## 2017-09-29 DIAGNOSIS — M25562 Pain in left knee: Secondary | ICD-10-CM

## 2017-09-29 DIAGNOSIS — M8618 Other acute osteomyelitis, other site: Secondary | ICD-10-CM

## 2017-09-29 DIAGNOSIS — Z6841 Body Mass Index (BMI) 40.0 and over, adult: Secondary | ICD-10-CM | POA: Diagnosis not present

## 2017-09-29 DIAGNOSIS — G8911 Acute pain due to trauma: Secondary | ICD-10-CM

## 2017-09-29 DIAGNOSIS — M009 Pyogenic arthritis, unspecified: Secondary | ICD-10-CM | POA: Insufficient documentation

## 2017-09-29 DIAGNOSIS — Z825 Family history of asthma and other chronic lower respiratory diseases: Secondary | ICD-10-CM | POA: Diagnosis not present

## 2017-09-29 DIAGNOSIS — B9561 Methicillin susceptible Staphylococcus aureus infection as the cause of diseases classified elsewhere: Secondary | ICD-10-CM | POA: Diagnosis present

## 2017-09-29 DIAGNOSIS — I10 Essential (primary) hypertension: Secondary | ICD-10-CM | POA: Diagnosis present

## 2017-09-29 DIAGNOSIS — G8929 Other chronic pain: Secondary | ICD-10-CM | POA: Diagnosis not present

## 2017-09-29 DIAGNOSIS — M25552 Pain in left hip: Secondary | ICD-10-CM | POA: Diagnosis not present

## 2017-09-29 DIAGNOSIS — Z7951 Long term (current) use of inhaled steroids: Secondary | ICD-10-CM | POA: Diagnosis not present

## 2017-09-29 DIAGNOSIS — X58XXXA Exposure to other specified factors, initial encounter: Secondary | ICD-10-CM

## 2017-09-29 DIAGNOSIS — M722 Plantar fascial fibromatosis: Secondary | ICD-10-CM | POA: Diagnosis not present

## 2017-09-29 DIAGNOSIS — Z95828 Presence of other vascular implants and grafts: Secondary | ICD-10-CM | POA: Diagnosis not present

## 2017-09-29 DIAGNOSIS — K219 Gastro-esophageal reflux disease without esophagitis: Secondary | ICD-10-CM | POA: Diagnosis present

## 2017-09-29 DIAGNOSIS — S6010XA Contusion of unspecified finger with damage to nail, initial encounter: Secondary | ICD-10-CM | POA: Diagnosis not present

## 2017-09-29 DIAGNOSIS — M5416 Radiculopathy, lumbar region: Secondary | ICD-10-CM | POA: Diagnosis present

## 2017-09-29 DIAGNOSIS — Z8249 Family history of ischemic heart disease and other diseases of the circulatory system: Secondary | ICD-10-CM | POA: Diagnosis not present

## 2017-09-29 DIAGNOSIS — Z800 Family history of malignant neoplasm of digestive organs: Secondary | ICD-10-CM

## 2017-09-29 DIAGNOSIS — M00811 Arthritis due to other bacteria, right shoulder: Secondary | ICD-10-CM | POA: Diagnosis not present

## 2017-09-29 DIAGNOSIS — Z833 Family history of diabetes mellitus: Secondary | ICD-10-CM | POA: Diagnosis not present

## 2017-09-29 DIAGNOSIS — R509 Fever, unspecified: Secondary | ICD-10-CM

## 2017-09-29 DIAGNOSIS — M545 Low back pain: Secondary | ICD-10-CM

## 2017-09-29 DIAGNOSIS — Z8719 Personal history of other diseases of the digestive system: Secondary | ICD-10-CM

## 2017-09-29 DIAGNOSIS — D62 Acute posthemorrhagic anemia: Secondary | ICD-10-CM | POA: Diagnosis not present

## 2017-09-29 HISTORY — DX: Pyogenic arthritis, unspecified: M00.9

## 2017-09-29 HISTORY — PX: IRRIGATION AND DEBRIDEMENT SHOULDER: SHX5880

## 2017-09-29 LAB — BASIC METABOLIC PANEL
Anion gap: 13 (ref 5–15)
BUN: 15 mg/dL (ref 6–20)
CO2: 21 mmol/L — ABNORMAL LOW (ref 22–32)
Calcium: 8.9 mg/dL (ref 8.9–10.3)
Chloride: 102 mmol/L (ref 101–111)
Creatinine, Ser: 0.74 mg/dL (ref 0.61–1.24)
GFR calc Af Amer: >60 mL/min (ref >60)
GFR calc non Af Amer: >60 mL/min (ref >60)
Glucose, Bld: 77 mg/dL (ref 65–99)
Potassium: 4.4 mmol/L (ref 3.5–5.1)
Sodium: 136 mmol/L (ref 135–145)

## 2017-09-29 LAB — CBC
HCT: 42.8 % (ref 39.0–52.0)
Hemoglobin: 14 g/dL (ref 13.0–17.0)
MCH: 28.1 pg (ref 26.0–34.0)
MCHC: 32.7 g/dL (ref 30.0–36.0)
MCV: 85.8 fL (ref 78.0–100.0)
Platelets: 255 10*3/uL (ref 150–400)
RBC: 4.99 MIL/uL (ref 4.22–5.81)
RDW: 13.5 % (ref 11.5–15.5)
WBC: 10.8 10*3/uL — ABNORMAL HIGH (ref 4.0–10.5)

## 2017-09-29 LAB — SEDIMENTATION RATE: Sed Rate: 68 mm/hr — ABNORMAL HIGH (ref 0–16)

## 2017-09-29 LAB — C-REACTIVE PROTEIN: CRP: 6.6 mg/dL — ABNORMAL HIGH (ref <1.0)

## 2017-09-29 SURGERY — IRRIGATION AND DEBRIDEMENT SHOULDER
Anesthesia: General | Site: Shoulder | Laterality: Right

## 2017-09-29 MED ORDER — HYDROMORPHONE HCL 1 MG/ML IJ SOLN: 0.2500 mg | INTRAMUSCULAR | Status: DC | PRN

## 2017-09-29 MED ORDER — BUPIVACAINE HCL (PF) 0.25 % IJ SOLN
INTRAMUSCULAR | Status: DC | PRN
  Administered 2017-09-29: 30 mL

## 2017-09-29 MED ORDER — VANCOMYCIN HCL 10 G IV SOLR
1250.0000 mg | Freq: Three times a day (TID) | INTRAVENOUS | Status: DC
  Administered 2017-09-30 – 2017-10-01 (×4): 1250 mg via INTRAVENOUS
  Filled 2017-09-29 (×6): qty 1250

## 2017-09-29 MED ORDER — PROPOFOL 10 MG/ML IV BOLUS
INTRAVENOUS | Status: AC
  Filled 2017-09-29: qty 20

## 2017-09-29 MED ORDER — LIDOCAINE HCL (CARDIAC) 20 MG/ML IV SOLN
INTRAVENOUS | Status: DC | PRN
  Administered 2017-09-29: 30 mg via INTRATRACHEAL

## 2017-09-29 MED ORDER — VANCOMYCIN HCL 1000 MG IV SOLR
INTRAVENOUS | Status: AC
Start: 2017-09-29 — End: ?
  Filled 2017-09-29: qty 1000

## 2017-09-29 MED ORDER — SODIUM CHLORIDE 0.9 % IV SOLN
500.0000 mg | Freq: Once | INTRAVENOUS | Status: AC
  Administered 2017-09-29: 1500 mg via INTRAVENOUS
  Filled 2017-09-29: qty 500

## 2017-09-29 MED ORDER — PROMETHAZINE HCL 25 MG/ML IJ SOLN: 6.2500 mg | INTRAMUSCULAR | Status: DC | PRN

## 2017-09-29 MED ORDER — MIDAZOLAM HCL 2 MG/2ML IJ SOLN
INTRAMUSCULAR | Status: AC
  Filled 2017-09-29: qty 2

## 2017-09-29 MED ORDER — ACETAMINOPHEN 325 MG PO TABS: 650.0000 mg | ORAL_TABLET | ORAL | Status: DC | PRN

## 2017-09-29 MED ORDER — SORBITOL 70 % SOLN: 30.0000 mL | Freq: Every day | Status: DC | PRN

## 2017-09-29 MED ORDER — SUGAMMADEX SODIUM 200 MG/2ML IV SOLN
INTRAVENOUS | Status: AC
  Filled 2017-09-29: qty 2

## 2017-09-29 MED ORDER — EPHEDRINE 5 MG/ML INJ
INTRAVENOUS | Status: AC
  Filled 2017-09-29: qty 10

## 2017-09-29 MED ORDER — CHLORHEXIDINE GLUCONATE 4 % EX LIQD: 60.0000 mL | Freq: Once | CUTANEOUS | Status: DC

## 2017-09-29 MED ORDER — MIDAZOLAM HCL 5 MG/5ML IJ SOLN
INTRAMUSCULAR | Status: DC | PRN
  Administered 2017-09-29: 2 mg via INTRAVENOUS

## 2017-09-29 MED ORDER — MORPHINE SULFATE (PF) 2 MG/ML IV SOLN: 1.0000 mg | INTRAVENOUS | Status: DC | PRN

## 2017-09-29 MED ORDER — ALBUTEROL SULFATE (2.5 MG/3ML) 0.083% IN NEBU
3.0000 mL | INHALATION_SOLUTION | RESPIRATORY_TRACT | Status: DC
  Filled 2017-09-29: qty 3

## 2017-09-29 MED ORDER — SODIUM CHLORIDE 0.9 % IR SOLN
Status: DC | PRN
  Administered 2017-09-29: 3000 mL

## 2017-09-29 MED ORDER — VANCOMYCIN HCL IN DEXTROSE 1-5 GM/200ML-% IV SOLN
INTRAVENOUS | Status: AC
Start: 2017-09-29 — End: ?
  Filled 2017-09-29: qty 200

## 2017-09-29 MED ORDER — ONDANSETRON HCL 4 MG/2ML IJ SOLN: 4.0000 mg | Freq: Four times a day (QID) | INTRAMUSCULAR | Status: DC | PRN

## 2017-09-29 MED ORDER — VANCOMYCIN HCL 10 G IV SOLR: 1500.0000 mg | INTRAVENOUS | Status: DC

## 2017-09-29 MED ORDER — MAGNESIUM CITRATE PO SOLN
1.0000 | Freq: Once | ORAL | Status: AC | PRN
  Administered 2017-10-01: 1 via ORAL
  Filled 2017-09-29: qty 296

## 2017-09-29 MED ORDER — LIDOCAINE 2% (20 MG/ML) 5 ML SYRINGE
INTRAMUSCULAR | Status: AC
  Filled 2017-09-29: qty 5

## 2017-09-29 MED ORDER — SODIUM CHLORIDE 0.9 % IV SOLN
2.0000 g | INTRAVENOUS | Status: DC
  Administered 2017-09-29: 2 g via INTRAVENOUS
  Filled 2017-09-29 (×2): qty 20

## 2017-09-29 MED ORDER — SODIUM CHLORIDE 0.9 % IV SOLN
INTRAVENOUS | Status: DC
  Administered 2017-09-29 – 2017-09-30 (×2): via INTRAVENOUS

## 2017-09-29 MED ORDER — BUPIVACAINE-EPINEPHRINE (PF) 0.5% -1:200000 IJ SOLN
INTRAMUSCULAR | Status: AC
  Filled 2017-09-29: qty 30

## 2017-09-29 MED ORDER — SUCCINYLCHOLINE CHLORIDE 200 MG/10ML IV SOSY
PREFILLED_SYRINGE | INTRAVENOUS | Status: AC
  Filled 2017-09-29: qty 10

## 2017-09-29 MED ORDER — ONDANSETRON HCL 4 MG/2ML IJ SOLN
INTRAMUSCULAR | Status: AC
  Filled 2017-09-29: qty 2

## 2017-09-29 MED ORDER — OXYCODONE HCL 5 MG PO TABS
10.0000 mg | ORAL_TABLET | ORAL | Status: DC | PRN
  Administered 2017-09-29 – 2017-10-02 (×11): 10 mg via ORAL
  Filled 2017-09-29 (×11): qty 2

## 2017-09-29 MED ORDER — DEXAMETHASONE SODIUM PHOSPHATE 10 MG/ML IJ SOLN
INTRAMUSCULAR | Status: AC
  Filled 2017-09-29: qty 1

## 2017-09-29 MED ORDER — TRIAMCINOLONE ACETONIDE 55 MCG/ACT NA AERO
1.0000 | INHALATION_SPRAY | Freq: Every day | NASAL | Status: DC
  Administered 2017-09-30 – 2017-10-02 (×3): 1 via NASAL
  Filled 2017-09-29: qty 10.8

## 2017-09-29 MED ORDER — LACTATED RINGERS IV SOLN: INTRAVENOUS | Status: DC

## 2017-09-29 MED ORDER — DEXAMETHASONE SODIUM PHOSPHATE 10 MG/ML IJ SOLN
INTRAMUSCULAR | Status: DC | PRN
  Administered 2017-09-29: 10 mg via INTRAVENOUS

## 2017-09-29 MED ORDER — ONDANSETRON HCL 4 MG/2ML IJ SOLN
INTRAMUSCULAR | Status: DC | PRN
  Administered 2017-09-29: 4 mg via INTRAVENOUS

## 2017-09-29 MED ORDER — FENTANYL CITRATE (PF) 100 MCG/2ML IJ SOLN
INTRAMUSCULAR | Status: DC | PRN
  Administered 2017-09-29 (×3): 100 ug via INTRAVENOUS
  Administered 2017-09-29: 50 ug via INTRAVENOUS
  Administered 2017-09-29: 150 ug via INTRAVENOUS

## 2017-09-29 MED ORDER — METHOCARBAMOL 1000 MG/10ML IJ SOLN
500.0000 mg | Freq: Four times a day (QID) | INTRAVENOUS | Status: DC | PRN
  Filled 2017-09-29: qty 5

## 2017-09-29 MED ORDER — MOMETASONE FURO-FORMOTEROL FUM 200-5 MCG/ACT IN AERO
2.0000 | INHALATION_SPRAY | Freq: Two times a day (BID) | RESPIRATORY_TRACT | Status: DC
  Administered 2017-09-29 – 2017-10-02 (×6): 2 via RESPIRATORY_TRACT
  Filled 2017-09-29: qty 8.8

## 2017-09-29 MED ORDER — VANCOMYCIN HCL 1000 MG IV SOLR
INTRAVENOUS | Status: DC | PRN
  Administered 2017-09-29: 1000 mg via TOPICAL

## 2017-09-29 MED ORDER — VANCOMYCIN HCL IN DEXTROSE 1-5 GM/200ML-% IV SOLN
1000.0000 mg | Freq: Once | INTRAVENOUS | Status: AC
  Administered 2017-09-29: 1000 mg via INTRAVENOUS
  Filled 2017-09-29: qty 200

## 2017-09-29 MED ORDER — ACETAMINOPHEN 650 MG RE SUPP: 650.0000 mg | RECTAL | Status: DC | PRN

## 2017-09-29 MED ORDER — 0.9 % SODIUM CHLORIDE (POUR BTL) OPTIME
TOPICAL | Status: DC | PRN
  Administered 2017-09-29: 1000 mL

## 2017-09-29 MED ORDER — LISINOPRIL 10 MG PO TABS
10.0000 mg | ORAL_TABLET | Freq: Every day | ORAL | Status: DC
  Administered 2017-09-30 – 2017-10-02 (×3): 10 mg via ORAL
  Filled 2017-09-29 (×3): qty 1

## 2017-09-29 MED ORDER — DIPHENHYDRAMINE HCL 12.5 MG/5ML PO ELIX
25.0000 mg | ORAL_SOLUTION | ORAL | Status: DC | PRN
  Administered 2017-10-02: 25 mg via ORAL
  Filled 2017-09-29: qty 10

## 2017-09-29 MED ORDER — ONDANSETRON HCL 4 MG PO TABS: 4.0000 mg | ORAL_TABLET | Freq: Four times a day (QID) | ORAL | Status: DC | PRN

## 2017-09-29 MED ORDER — PROPOFOL 10 MG/ML IV BOLUS
INTRAVENOUS | Status: DC | PRN
  Administered 2017-09-29: 200 mg via INTRAVENOUS

## 2017-09-29 MED ORDER — FENTANYL CITRATE (PF) 250 MCG/5ML IJ SOLN
INTRAMUSCULAR | Status: AC
  Filled 2017-09-29: qty 5

## 2017-09-29 MED ORDER — PANTOPRAZOLE SODIUM 40 MG PO TBEC
40.0000 mg | DELAYED_RELEASE_TABLET | Freq: Every day | ORAL | Status: DC
  Administered 2017-09-30 – 2017-10-02 (×3): 40 mg via ORAL
  Filled 2017-09-29 (×3): qty 1

## 2017-09-29 MED ORDER — PHENYLEPHRINE 40 MCG/ML (10ML) SYRINGE FOR IV PUSH (FOR BLOOD PRESSURE SUPPORT)
PREFILLED_SYRINGE | INTRAVENOUS | Status: AC
  Filled 2017-09-29: qty 10

## 2017-09-29 MED ORDER — POLYETHYLENE GLYCOL 3350 17 G PO PACK
17.0000 g | PACK | Freq: Every day | ORAL | Status: DC | PRN
  Administered 2017-10-01: 17 g via ORAL
  Filled 2017-09-29: qty 1

## 2017-09-29 MED ORDER — KETOROLAC TROMETHAMINE 30 MG/ML IJ SOLN
INTRAMUSCULAR | Status: AC
  Filled 2017-09-29: qty 1

## 2017-09-29 MED ORDER — ROCURONIUM BROMIDE 10 MG/ML (PF) SYRINGE
PREFILLED_SYRINGE | INTRAVENOUS | Status: AC
  Filled 2017-09-29: qty 5

## 2017-09-29 MED ORDER — METOCLOPRAMIDE HCL 5 MG/ML IJ SOLN: 5.0000 mg | Freq: Three times a day (TID) | INTRAMUSCULAR | Status: DC | PRN

## 2017-09-29 MED ORDER — CYCLOBENZAPRINE HCL 10 MG PO TABS: 10.0000 mg | ORAL_TABLET | Freq: Two times a day (BID) | ORAL | Status: DC | PRN

## 2017-09-29 MED ORDER — LORATADINE 10 MG PO TABS
10.0000 mg | ORAL_TABLET | Freq: Every day | ORAL | Status: DC
  Administered 2017-09-30 – 2017-10-02 (×3): 10 mg via ORAL
  Filled 2017-09-29 (×4): qty 1

## 2017-09-29 MED ORDER — KETOROLAC TROMETHAMINE 30 MG/ML IJ SOLN
30.0000 mg | Freq: Once | INTRAMUSCULAR | Status: DC | PRN
  Administered 2017-09-29: 30 mg via INTRAVENOUS

## 2017-09-29 MED ORDER — OXYCODONE HCL 5 MG PO TABS: 5.0000 mg | ORAL_TABLET | ORAL | Status: DC | PRN

## 2017-09-29 MED ORDER — SUCCINYLCHOLINE CHLORIDE 20 MG/ML IJ SOLN
INTRAMUSCULAR | Status: DC | PRN
  Administered 2017-09-29: 200 mg via INTRAVENOUS

## 2017-09-29 MED ORDER — MEPERIDINE HCL 50 MG/ML IJ SOLN: 6.2500 mg | INTRAMUSCULAR | Status: DC | PRN

## 2017-09-29 MED ORDER — LACTATED RINGERS IV SOLN
INTRAVENOUS | Status: DC
  Administered 2017-09-29: 12:00:00 via INTRAVENOUS

## 2017-09-29 MED ORDER — HYDROMORPHONE HCL 1 MG/ML IJ SOLN
INTRAMUSCULAR | Status: DC | PRN
  Administered 2017-09-29: 0.5 mg via INTRAVENOUS

## 2017-09-29 MED ORDER — METOCLOPRAMIDE HCL 5 MG PO TABS: 5.0000 mg | ORAL_TABLET | Freq: Three times a day (TID) | ORAL | Status: DC | PRN

## 2017-09-29 MED ORDER — HYDROMORPHONE HCL 1 MG/ML IJ SOLN
INTRAMUSCULAR | Status: AC
Start: 2017-09-29 — End: ?
  Filled 2017-09-29: qty 0.5

## 2017-09-29 MED ORDER — METHOCARBAMOL 500 MG PO TABS
500.0000 mg | ORAL_TABLET | Freq: Four times a day (QID) | ORAL | Status: DC | PRN
  Administered 2017-09-29 – 2017-10-02 (×6): 500 mg via ORAL
  Filled 2017-09-29 (×6): qty 1

## 2017-09-29 MED ORDER — ALBUTEROL SULFATE (2.5 MG/3ML) 0.083% IN NEBU: 3.0000 mL | INHALATION_SOLUTION | RESPIRATORY_TRACT | Status: DC | PRN

## 2017-09-29 SURGICAL SUPPLY — 56 items
BLADE SURG 10 STRL SS (BLADE) IMPLANT
BNDG CMPR 9X4 STRL LF SNTH (GAUZE/BANDAGES/DRESSINGS)
BNDG ESMARK 4X9 LF (GAUZE/BANDAGES/DRESSINGS) IMPLANT
CONT SPEC 4OZ CLIKSEAL STRL BL (MISCELLANEOUS) ×1 IMPLANT
COVER SURGICAL LIGHT HANDLE (MISCELLANEOUS) ×2 IMPLANT
CUFF TOURNIQUET SINGLE 18IN (TOURNIQUET CUFF) ×1 IMPLANT
CUFF TOURNIQUET SINGLE 24IN (TOURNIQUET CUFF) IMPLANT
DECANTER SPIKE VIAL GLASS SM (MISCELLANEOUS) ×1 IMPLANT
DRAPE C-ARM 42X72 X-RAY (DRAPES) ×2 IMPLANT
DRAPE IMP U-DRAPE 54X76 (DRAPES) ×2 IMPLANT
DRAPE INCISE IOBAN 66X45 STRL (DRAPES) ×2 IMPLANT
DRAPE U-SHAPE 47X51 STRL (DRAPES) ×2 IMPLANT
DRSG TEGADERM 4X4.75 (GAUZE/BANDAGES/DRESSINGS) ×8 IMPLANT
ELECT CAUTERY BLADE 6.4 (BLADE) ×1 IMPLANT
ELECT REM PT RETURN 9FT ADLT (ELECTROSURGICAL) ×2
ELECTRODE REM PT RTRN 9FT ADLT (ELECTROSURGICAL) ×1 IMPLANT
EVACUATOR 1/8 PVC DRAIN (DRAIN) ×1 IMPLANT
FACESHIELD WRAPAROUND (MASK) ×2 IMPLANT
FACESHIELD WRAPAROUND OR TEAM (MASK) ×1 IMPLANT
GAUZE SPONGE 4X4 12PLY STRL (GAUZE/BANDAGES/DRESSINGS) ×2 IMPLANT
GAUZE XEROFORM 1X8 LF (GAUZE/BANDAGES/DRESSINGS) ×1 IMPLANT
GAUZE XEROFORM 5X9 LF (GAUZE/BANDAGES/DRESSINGS) ×2 IMPLANT
GLOVE BIOGEL PI IND STRL 7.0 (GLOVE) ×1 IMPLANT
GLOVE BIOGEL PI INDICATOR 7.0 (GLOVE) ×1
GLOVE ECLIPSE 7.0 STRL STRAW (GLOVE) ×2 IMPLANT
GLOVE SKINSENSE NS SZ7.5 (GLOVE) ×2
GLOVE SKINSENSE STRL SZ7.5 (GLOVE) ×2 IMPLANT
GOWN STRL REIN XL XLG (GOWN DISPOSABLE) ×6 IMPLANT
KIT BASIN OR (CUSTOM PROCEDURE TRAY) ×2 IMPLANT
KIT ROOM TURNOVER OR (KITS) ×2 IMPLANT
MANIFOLD NEPTUNE II (INSTRUMENTS) ×2 IMPLANT
NEEDLE 22X1 1/2 (OR ONLY) (NEEDLE) ×1 IMPLANT
NS IRRIG 1000ML POUR BTL (IV SOLUTION) ×2 IMPLANT
PACK SHOULDER (CUSTOM PROCEDURE TRAY) ×2 IMPLANT
PACK UNIVERSAL I (CUSTOM PROCEDURE TRAY) ×2 IMPLANT
PAD ABD 8X10 STRL (GAUZE/BANDAGES/DRESSINGS) ×3 IMPLANT
PAD ARMBOARD 7.5X6 YLW CONV (MISCELLANEOUS) ×4 IMPLANT
PAD CAST 4YDX4 CTTN HI CHSV (CAST SUPPLIES) ×1 IMPLANT
PADDING CAST COTTON 4X4 STRL (CAST SUPPLIES) ×2
SLING ARM FOAM STRAP LRG (SOFTGOODS) ×1 IMPLANT
SLING ARM IMMOBILIZER LRG (SOFTGOODS) ×1 IMPLANT
SPONGE LAP 18X18 X RAY DECT (DISPOSABLE) ×2 IMPLANT
STAPLER VISISTAT 35W (STAPLE) IMPLANT
SUCTION FRAZIER HANDLE 10FR (MISCELLANEOUS)
SUCTION TUBE FRAZIER 10FR DISP (MISCELLANEOUS) IMPLANT
SUT ETHILON 3 0 PS 1 (SUTURE) ×7 IMPLANT
SUT VIC AB 0 CT1 27 (SUTURE)
SUT VIC AB 0 CT1 27XBRD ANBCTR (SUTURE) ×2 IMPLANT
SUT VIC AB 2-0 CT1 27 (SUTURE)
SUT VIC AB 2-0 CT1 TAPERPNT 27 (SUTURE) ×2 IMPLANT
SYR CONTROL 10ML LL (SYRINGE) ×1 IMPLANT
TAPE CLOTH SURG 6X10 WHT LF (GAUZE/BANDAGES/DRESSINGS) ×1 IMPLANT
TOWEL OR 17X24 6PK STRL BLUE (TOWEL DISPOSABLE) IMPLANT
TOWEL OR 17X26 10 PK STRL BLUE (TOWEL DISPOSABLE) ×2 IMPLANT
UNDERPAD 30X30 (UNDERPADS AND DIAPERS) ×2 IMPLANT
WATER STERILE IRR 1000ML POUR (IV SOLUTION) ×2 IMPLANT

## 2017-09-29 NOTE — Discharge Instructions (Signed)
Postoperative instructions:  Weightbearing instructions: as tolerated  Keep your dressing and/or splint clean and dry at all times.  You can remove your dressing on post-operative day #3 and change with a dry/sterile dressing or Band-Aids as needed thereafter.    Incision instructions:  Do not soak your incision for 3 weeks after surgery.  If the incision gets wet, pat dry and do not scrub the incision.  Pain control:  You have been given a prescription to be taken as directed for post-operative pain control.  In addition, elevate the operative extremity above the heart at all times to prevent swelling and throbbing pain.  Take over-the-counter Colace, 180m by mouth twice a day while taking narcotic pain medications to help prevent constipation.  Follow up appointments: 1) 10-14 days for suture removal and wound check. 2) Dr. XErlinda Hongas scheduled.   -------------------------------------------------------------------------------------------------------------  After Surgery Pain Control:  After your surgery, post-surgical discomfort or pain is likely. This discomfort can last several days to a few weeks. At certain times of the day your discomfort may be more intense.  Did you receive a nerve block?  A nerve block can provide pain relief for one hour to two days after your surgery. As long as the nerve block is working, you will experience little or no sensation in the area the surgeon operated on.  As the nerve block wears off, you will begin to experience pain or discomfort. It is very important that you begin taking your prescribed pain medication before the nerve block fully wears off. Treating your pain at the first sign of the block wearing off will ensure your pain is better controlled and more tolerable when full-sensation returns. Do not wait until the pain is intolerable, as the medicine will be less effective. It is better to treat pain in advance than to try and catch up.  General  Anesthesia:  If you did not receive a nerve block during your surgery, you will need to start taking your pain medication shortly after your surgery and should continue to do so as prescribed by your surgeon.  Pain Medication:  Most commonly we prescribe Vicodin and Percocet for post-operative pain. Both of these medications contain a combination of acetaminophen (Tylenol) and a narcotic to help control pain.   It takes between 30 and 45 minutes before pain medication starts to work. It is important to take your medication before your pain level gets too intense.   Nausea is a common side effect of many pain medications. You will want to eat something before taking your pain medicine to help prevent nausea.   If you are taking a prescription pain medication that contains acetaminophen, we recommend that you do not take additional over the counter acetaminophen (Tylenol).  Other pain relieving options:   Using a cold pack to ice the affected area a few times a day (15 to 20 minutes at a time) can help to relieve pain, reduce swelling and bruising.   Elevation of the affected area can also help to reduce pain and swelling.

## 2017-09-29 NOTE — H&P (Signed)
PREOPERATIVE H&P  Chief Complaint: right acromioclavicular joint infection  HPI: [name] is a 42 y.o. male who presents for surgical treatment of right acromioclavicular joint infection.  He denies any changes in medical history.  Past Medical History:  Diagnosis Date  . Asthma   . HTN (hypertension)   . Pancreatitis    Past Surgical History:  Procedure Laterality Date  . LUMBAR [place] SURGERY  2002   Social History   Socioeconomic History  . Marital status: Married    Spouse name: None  . Number of children: 1  . Years of education: None  . Highest education level: None  Social Needs  . Financial resource strain: None  . Food insecurity - worry: None  . Food insecurity - inability: None  . Transportation needs - medical: None  . Transportation needs - non-medical: None  Occupational History  . Occupation: delivery driver  Tobacco Use  . Smoking status: Never Smoker  . Smokeless tobacco: Never Used  Substance and Sexual Activity  . Alcohol use: Yes    Comment: rarely 2-3 times a year  . Drug use: No  . Sexual activity: None  Other Topics Concern  . None  Social History Narrative  . None   Family History  Problem Relation Age of Onset  . Asthma Brother   . Diabetes Father   . Hypertension Father   . Diabetes Paternal Grandmother   . Hypertension Paternal Grandmother   . Migraines Mother   . GER disease Mother   . Pancreatic cancer Maternal Grandmother   . Heart disease Maternal Grandfather    No Known Allergies Prior to Admission medications   Medication Sig Start Date End Date Taking? Authorizing Provider  Albuterol Sulfate (PROAIR RESPICLICK) 188 (90 Base) MCG/ACT AEPB Inhale 2 puffs into the lungs every 4 (four) hours. 07/27/17  Yes Kozlow, Donnamarie Poag, MD  budesonide-formoterol [facility]) 160-4.5 MCG/ACT inhaler Inhale two puffs twice daily to prevent cough or wheeze. Rinse, gargle, and spit after use. 12/06/16  Yes Kozlow, Donnamarie Poag, MD  cetirizine  (ZYRTEC) 10 MG tablet Take 10 mg by mouth daily.   Yes [provider]  cyclobenzaprine (FLEXERIL) 10 MG tablet Take 1 tablet (10 mg total) by mouth 2 (two) times daily as needed for muscle spasms. 09/16/17  Yes Neese, [place], NP  diclofenac (VOLTAREN) 50 MG EC tablet Take 1 tablet (50 mg total) by mouth 2 (two) times daily. 09/16/17  Yes Neese, [place], NP  lisinopril (PRINIVIL,ZESTRIL) 10 MG tablet Take 10 mg by mouth daily. 04/16/17  Yes [provider]  naproxen sodium (ALEVE) 220 MG tablet Take 220 mg by mouth 2 (two) times daily as needed (pain).   Yes [provider]  omeprazole (PRILOSEC) 20 MG capsule Take 20 mg by mouth 2 (two) times daily before a meal.    Yes [provider]  Triamcinolone Acetonide (NASACORT ALLERGY 24HR NA) Place 1 spray into the nose daily.   Yes [provider]     Positive ROS: All other systems have been reviewed and were otherwise negative with the exception of those mentioned in the HPI and as above.  Physical Exam: General: Alert, no acute distress Cardiovascular: No pedal edema Respiratory: No cyanosis, no use of accessory musculature GI: abdomen soft Skin: No lesions in the area of chief complaint Neurologic: Sensation intact distally Psychiatric: Patient is competent for consent with normal mood and affect Lymphatic: no lymphedema  MUSCULOSKELETAL: exam stable  Assessment:  right acromioclavicular joint infection  Plan: Plan for Procedure(s): IRRIGATION AND DEBRIDEMENT SHOULDER  The risks benefits and alternatives were discussed with the patient including but not limited to the risks of nonoperative treatment, versus surgical intervention including infection, bleeding, nerve injury,  blood clots, cardiopulmonary complications, morbidity, mortality, among others, and they were willing to proceed.   Eduard Roux, MD   09/29/2017 1:22 PM

## 2017-09-29 NOTE — Anesthesia Preprocedure Evaluation (Signed)
Anesthesia Evaluation  Patient identified by MRN, date of birth, ID band Patient awake    Reviewed: Allergy & Precautions, NPO status , Patient's Chart, lab work & pertinent test results  Airway Mallampati: II       Dental  (+) Poor Dentition   Pulmonary    Pulmonary exam normal breath sounds clear to auscultation       Cardiovascular hypertension, Pt. on medications Normal cardiovascular exam Rhythm:Regular Rate:Normal     Neuro/Psych negative psych ROS   GI/Hepatic Neg liver ROS, GERD  Medicated,  Endo/Other  Morbid obesity  Renal/GU negative Renal ROS  negative genitourinary   Musculoskeletal   Abdominal (+) + obese,   Peds  Hematology negative hematology ROS (+)   Anesthesia Other Findings   Reproductive/Obstetrics                             Anesthesia Physical Anesthesia Plan  ASA: III  Anesthesia Plan: General   Post-op Pain Management:    Induction: Intravenous  PONV Risk Score and Plan: 3 and Ondansetron and Dexamethasone  Airway Management Planned: LMA  Additional Equipment:   Intra-op Plan:   Post-operative Plan: Extubation in OR  Informed Consent: I have reviewed the patients History and Physical, chart, labs and discussed the procedure including the risks, benefits and alternatives for the proposed anesthesia with the patient or authorized representative who has indicated his/her understanding and acceptance.   Dental advisory given  Plan Discussed with: CRNA and Surgeon  Anesthesia Plan Comments:         Anesthesia Quick Evaluation

## 2017-09-29 NOTE — Op Note (Addendum)
   Date of Surgery: 09/29/2017  INDICATIONS: [name] is a 42 y.o.-year-old male with a right septic AC joint;  The patient did consent to the procedure after discussion of the risks and benefits.  PREOPERATIVE DIAGNOSIS: Right septic AC joint  POSTOPERATIVE DIAGNOSIS: Same.  PROCEDURE:  1.  Arthrotomy of right AC joint 2.  Irrigation and debridement of distal clavicle, subcutaneous tissue, skin 125 cm 3.  Partial excision of distal clavicle  SURGEON: N. Eduard Roux, M.D.  ASSIST: Ciro Backer [place], [place]; necessary for the timely completion of procedure and due to complexity of procedure.  ANESTHESIA:  general  IV FLUIDS AND URINE: See anesthesia.  ESTIMATED BLOOD LOSS: 100 mL.  IMPLANTS: None  DRAINS: Medium Hemovac  COMPLICATIONS: None.  DESCRIPTION OF PROCEDURE: The patient was brought to the operating room and placed supine on the operating table.  The patient had been signed prior to the procedure and this was documented. The patient had the anesthesia placed by the anesthesiologist.  A time-out was performed to confirm that this was the correct patient, site, side and location. The patient did receive antibiotics after intraoperative cultures were obtained.  The patient had the operative extremity prepped and draped in the standard surgical fashion.    A longitudinal incision based over the [facility] joint heading towards the coracoid was created.  Dissection was carried down through the subcutaneous tissue.  There was a large amount of frank purulence that was expressed.  This was cultured.  Tissue cultures were also taken.  We continued our dissection down to the [facility] joint.  This was then localized and then I used a rondure to sharply debride the distal clavicle.  This was also sent for culture to evaluate for osteomyelitis.  I then further performed sharp excisional debridement of the subcutaneous tissue and skin using a rondure and knife.  After thorough debridement I then  obtained hemostasis.  I then irrigated X liters of normal saline through the surgical wound and AC joint.  After this was done I then placed a medium Hemovac in the surgical wound and brought out laterally.  1 g of vancomycin powder was placed throughout the surgical wound.  The wound was then closed with 3-0 nylon sutures.  Sterile dressings were applied.  The shoulder was placed in a shoulder sling for comfort.  Patient tolerated procedure well had no immediate complications.  POSTOPERATIVE PLAN: Will need infectious consultation and IV antibiotics.  Azucena Cecil, MD [facility] [phone] 2:39 PM

## 2017-09-29 NOTE — Progress Notes (Signed)
Office Visit Note   Patient: [name]           Date of Birth: May 28, 1976           MRN: [id number] Visit Date: 09/29/2017              Requested by: Tamsen Roers, [place], [place] [address] PCP: Tamsen Roers, MD   Assessment & Plan: Visit Diagnoses:  1. Septic arthritis of right acromioclavicular joint (HCC)     Plan: Impression is right AC joint septic arthritis of unknown origin.  I was able to aspirate approximately 10 cc of frank pus.  This was sent to the lab for culture and cell count and crystals.  We will plan on performing a formal and urgent irrigation and debridement of his right shoulder this afternoon.  He has been n.p.o. since 7 AM.  We will plan postoperatively for IV antibiotic and likely consultation with infectious disease.  This was all discussed with the patient who is in agreement.  Follow-Up Instructions: Return if symptoms worsen or fail to improve.   Orders:  Orders Placed This Encounter  Procedures  . Gram stain  . Anaerobic and Aerobic Culture  . XR Shoulder Right  . Cell count + diff,  w/ cryst-synvl fld   No orders of the defined types were placed in this encounter.     Procedures: Medium Joint Inj: R acromioclavicular on 09/29/2017 9:22 AM Indications: pain, diagnostic evaluation and joint swelling Details: 18 G needle Aspirate: 10 mL purulent; sent for lab analysis Outcome: tolerated well, no immediate complications Patient was prepped and draped in the usual sterile fashion.       Clinical Data: No additional findings.   Subjective: Chief Complaint  Patient presents with  . Right Shoulder - Follow-up    Vir comes in today for acute right shoulder pain for the last week.  He did state that he had a temperature of 102 on Monday but then has been afebrile since then.  He denies history of gout or previous shoulder surgery.  Denies any recent injuries.  He does endorse significant pain in his right shoulder.   Denies any numbness and tingling.  He has been using crutches.  His primary care doctor put him on antibiotics but were not exactly sure what type.  He states that he is only had one dose.    Review of Systems  Constitutional: Negative.   All other systems reviewed and are negative.    Objective: Vital Signs: There were no vitals taken for this visit.  Physical Exam  Constitutional: He is oriented to person, place, and time. He appears well-developed and well-nourished.  HENT:  Head: Normocephalic and atraumatic.  Eyes: Pupils are equal, round, and reactive to light.  Neck: Neck supple.  Pulmonary/Chest: Effort normal.  Abdominal: Soft.  Musculoskeletal: Normal range of motion.  Neurological: He is alert and oriented to person, place, and time.  Skin: Skin is warm.  Psychiatric: He has a normal mood and affect. His behavior is normal. Judgment and thought content normal.  Nursing note and vitals reviewed.   Ortho Exam Right shoulder exam shows a large area of fluctuance and cellulitis is very tender to palpation that is localized over the [facility] joint.  He has a positive cross adduction sign.  Rotator cuff is grossly intact.  There is no open wounds. Specialty Comments:  No specialty comments available.  Imaging: Xr Shoulder Right  Result Date:  09/29/2017 Enlargement of distal clavicle with erosion worrisome for osteomyelitis    PMFS History: Patient Active Problem List   Diagnosis Date Noted  . Septic arthritis of right acromioclavicular joint ([place]) 09/29/2017  . Chronic left-sided low back pain with left-sided sciatica 09/19/2017  . Epigastric pain   . Pancreatitis 04/17/2017  . Essential hypertension 04/17/2017  . Moderate persistent asthma 07/21/2015  . Allergic rhinoconjunctivitis 07/21/2015  . Esophageal reflux 07/21/2015  . ABNORMALITY OF GAIT 11/12/2009  . TARSAL TUNNEL SYNDROME, LEFT 10/08/2009  . PES PLANUS 10/08/2009   Past Medical History:  Diagnosis  Date  . Asthma   . HTN (hypertension)   . Pancreatitis     Family History  Problem Relation Age of Onset  . Asthma Brother   . Diabetes Father   . Hypertension Father   . Diabetes Paternal Grandmother   . Hypertension Paternal Grandmother   . Migraines Mother   . GER disease Mother   . Pancreatic cancer Maternal Grandmother   . Heart disease Maternal Grandfather     Past Surgical History:  Procedure Laterality Date  . LUMBAR [place] SURGERY  2002   Social History   Occupational History  . Occupation: delivery driver  Tobacco Use  . Smoking status: Never Smoker  . Smokeless tobacco: Never Used  Substance and Sexual Activity  . Alcohol use: Yes    Comment: rarely 2-3 times a year  . Drug use: No  . Sexual activity: Not on file

## 2017-09-29 NOTE — Consult Note (Addendum)
[place] for Infectious Disease    Date of Admission:  09/29/2017     Total days of antibiotics 0  Vancomycin intra-op 2/22       Reason for Consult: septic [facility]     Referring Provider: Erlinda Hong   Assessment: 42 yo male admitted for arthrotomy of the right [facility] joint by Dr. Erlinda Hong. Performed irrigation and debridement of distal clavicle and subcutaneous tissue as there was some bone erosion present. Purulent material, surrounding tissue and bone were all sent for culture. Seems he has had spontaneous septic arthritis +/- osteomyelitis of the right native [facility] joint which is strange. No other joints seem to be infected based on exam. Would presume this infection is related to staph or strep and will ensure coverage for these with Vancomycin. No risk for pseudomonal or anaerobic involvement as he is not diabetic and has not been in the hospital recently - will change to ceftriaxone until cultures return.   Plan: 1. Will change zosyn to ceftriaxone and continue Vancomycin.  2. Will go ahead and place orders for PICC line to be placed into the left arm for home IV therapies 3. Case Management consult placed as well to facilitate HH needs.  4. He will need 4-6 weeks of IV treatment. We briefly discussed PICC line and home management - will go into more detail tomorrow as he is only recently out of OR.  5. Watch cultures to narrow abx - he did receive one dose of an antibiotic meant to be taken multiple times a day (amoxicillin vs doxy based on his description) but should not impact growth from op samples.   6. Check nasal PCR for staph colonization  7. Baseline CRP and ESR in AM  8. Health maintanance - will check HgbA1c considering strong family history of diabetes to ensure best chance for recovery of infection. Check hep c ab.   Appreciate the opportunity to participate in [name] care.   Active Problems:   Septic arthritis of right acromioclavicular joint ([place])   . chlorhexidine   60 mL Topical Once  . chlorhexidine  60 mL Topical Once    HPI: [name] is a 42 y.o. male with past medical history significant for pancreatitis (non-ETOH related), asthma and hypertension. He is a patient of Dr. Erlinda Hong in the ambulatory setting and sees him for chronic lumbar pain and   He was seen in the office earlier this morning for pain in the right shoulder that started last week with associated fever to 102 Monday. Aspiration of 10cc frank pus was obtained and arrangements were made to bring him to the OR today for I&D. He had seen his PCP earlier and placed on antibiotics of which he took one dose so far. On exam he had a large area of fluctuance and cellulitis with tenderness over the [facility] joint. No history of IVDU, trauma or injury to this area, recent dental procedures or problems. He works at [employer] doing physical work.   February 4th he was seen by podiatrist for right heel pain (acute plantar fasciitis). He received an injection of kenalog/xylocaine and systemic prednisone taper. Flu test was obtained at this time and negative per chart review. ED visit Feb 9th for left leg pain that started at the hip and radiated to knee - this pain has been intermittent for the past year but exacerbated by fall 3 weeks prior. He followed up with Dr. Erlinda Hong outpatient and  MRI was ordered but not yet obtained to evaluate lumbar spine for radiculopathy.   Op Note: large amount of frank purulence expressed that was obtained for culture in addition to tissue culture. Sharp debridement of the distal clavicle was performed and sent for culture/histo to evaluate for osteomyelitis. Irrigation with 10L saline followed by 1g of vancomycin powder placed directly into wound and hemovac drain was left in place.   Reports some left knee/hip pain mostly sciatica/radiculopathy symptoms which for him has been chronic.    Review of Systems: Review of Systems  Constitutional: Positive for fever (once to 102.1).  Negative for chills and malaise/fatigue.  HENT: Negative for congestion, sinus pain and sore throat.   Respiratory: Negative for cough, sputum production and shortness of breath.   Cardiovascular: Negative for chest pain and leg swelling.  Gastrointestinal: Negative for abdominal pain and vomiting.  Genitourinary: Negative for dysuria.  Musculoskeletal: Positive for joint pain (right shoulder ).  Skin: Negative for rash.  Neurological: Negative for headaches.   Past Medical History:  Diagnosis Date  . Asthma   . HTN (hypertension)   . Pancreatitis     Social History   Tobacco Use  . Smoking status: Never Smoker  . Smokeless tobacco: Never Used  Substance Use Topics  . Alcohol use: Yes    Comment: rarely 2-3 times a year  . Drug use: No    Family History  Problem Relation Age of Onset  . Asthma Brother   . Diabetes Father   . Hypertension Father   . Diabetes Paternal Grandmother   . Hypertension Paternal Grandmother   . Migraines Mother   . GER disease Mother   . Pancreatic cancer Maternal Grandmother   . Heart disease Maternal Grandfather    No Known Allergies  OBJECTIVE: Blood pressure 117/65, pulse 95, temperature 98.5 F (36.9 C), resp. rate 16, height _0  (1.753 m), weight 281 lb (127.5 kg), SpO2 100 %.  Physical Exam  Constitutional: He is oriented to person, place, and time and well-developed, well-nourished, and in no distress.  Sleepy from surgery but easily arousa.   HENT:  Mouth/Throat: No oral lesions. Normal dentition. No dental caries.  Eyes: No scleral icterus.  Cardiovascular: Normal rate, regular rhythm and normal heart sounds.  No murmur heard. Pulmonary/Chest: Effort normal and breath sounds normal. No respiratory distress.  Abdominal: Soft. He exhibits no distension. There is no tenderness.  Musculoskeletal:       Right shoulder: He exhibits decreased range of motion (Hemovac drain in place with bloody drainage. Shoulder bandage clean and  dry. No ROM performed as he is just from the OR. ) and tenderness.       Left hip: He exhibits no tenderness and no swelling.       Left knee: He exhibits no swelling, no effusion and no erythema. No tenderness found.  Lymphadenopathy:    He has no cervical adenopathy.  Neurological: He is alert and oriented to person, place, and time.  Skin: Skin is warm and dry. No rash noted.  Bruise to left nailbed after known injury at work. Reddened skin to several fingers of the right hand.   Psychiatric: Mood and affect normal.  Vitals reviewed.   Lab Results Lab Results  Component Value Date   WBC 10.8 (H) 09/29/2017   HGB 14.0 09/29/2017   HCT 42.8 09/29/2017   MCV 85.8 09/29/2017   PLT 255 09/29/2017    Lab Results  Component Value Date   CREATININE  0.74 09/29/2017   BUN 15 09/29/2017   NA 136 09/29/2017   K 4.4 09/29/2017   CL 102 09/29/2017   CO2 21 (L) 09/29/2017    Lab Results  Component Value Date   ALT 15 (L) 04/19/2017   AST 15 04/19/2017   ALKPHOS 54 04/19/2017   BILITOT 1.2 04/19/2017    No results found for: ESRSEDRATE, POCTSEDRATE  No results found for: HGBA1C  Microbiology: No results found for this or any previous visit (from the past 240 hour(s)).  Janene Madeira, MSN, NP-C [facility] for [facility] [place] [facility] Cell: [phone] Pager: [phone]  09/29/2017 3:27 PM

## 2017-09-29 NOTE — Progress Notes (Signed)
Pharmacy Antibiotic Note  [name] is a 42 y.o. male admitted on 09/29/2017 with septic joint.  Pharmacy has been consulted for vancomycin dosing.  Patient is s/p arthrotomy of R AC joint today. Received vancomycin 1559m IV at 1400 this afternoon in OR.  Plan is for 4-6 weeks of treatment.  Scr 0.74 and normalized CrCl >1070mmin  Plan: Vancomycin 1g IV x1 to be given now since 150070mose given earlier is a surgical prophylaxis dose and not a full therapeutic treatment loading dose Vancomycin 1250m82m q8h based on obesity nomogram Ceftriaxone 2g IV q24h per ID service Follow c/s, clinical progression, renal function Vancomycin trough ordered for 2/23 at 1730 before 4th dose  Height: 5' 9"  (175.3 cm) Weight: 281 lb (127.5 kg) IBW/kg (Calculated) : 70.7  Temp (24hrs), Avg:98.6 F (37 C), Min:98.5 F (36.9 C), Max:98.6 F (37 C)  Recent Labs  Lab 09/29/17 1143  WBC 10.8*  CREATININE 0.74    Estimated Creatinine Clearance: 157.3 mL/min (by C-G formula based on SCr of 0.74 mg/dL).    No Known Allergies  Antimicrobials this admission: Vancomycin 2/22 >>  Ceftriaxone 2/22 >>   Dose adjustments this admission: n/a  Microbiology results: 2/22 surgical abscess: 2/22 surgical tissue:  Thank you for allowing pharmacy to be a part of this patient's care.  Aj Crunkleton D. Jordynne Mccown, PharmD, BCPSFort Pierrenical Pharmacist [phone] 4:47 PM

## 2017-09-29 NOTE — Transfer of Care (Signed)
Immediate Anesthesia Transfer of Care Note  Patient: [name]  Procedure(s) Performed: IRRIGATION AND DEBRIDEMENT SHOULDER (Right )  Patient Location: PACU  Anesthesia Type:General  Level of Consciousness: awake, alert , oriented and patient cooperative  Airway & Oxygen Therapy: Patient Spontanous Breathing and Patient connected to nasal cannula oxygen  Post-op Assessment: Report given to RN and Post -op Vital signs reviewed and stable  Post vital signs: Reviewed and stable  Last Vitals:  Vitals:   09/29/17 1130  BP: (!) 162/74  Pulse: 99  Resp: 18  Temp: 37 C  SpO2: 97%    Last Pain:  Vitals:   09/29/17 1204  TempSrc:   PainSc: 5       Patients Stated Pain Goal: 3 (37/90/24 0973)  Complications: No apparent anesthesia complications

## 2017-09-29 NOTE — Anesthesia Procedure Notes (Signed)
Procedure Name: Intubation Date/Time: 09/29/2017 1:50 PM Performed by: [name], CRNA Pre-anesthesia Checklist: Patient identified, Emergency Drugs available, Suction available and Patient being monitored Patient Re-evaluated:Patient Re-evaluated prior to induction Oxygen Delivery Method: Circle system utilized Preoxygenation: Pre-oxygenation with 100% oxygen Induction Type: IV induction, Cricoid Pressure applied and Rapid sequence Laryngoscope Size: Glidescope, Mac and 4 Grade View: Grade III Tube type: Oral Tube size: 7.5 mm Number of attempts: 2 Airway Equipment and Method: Video-laryngoscopy and Stylet Placement Confirmation: ETT inserted through vocal cords under direct vision,  positive ETCO2 and breath sounds checked- equal and bilateral Secured at: 21 cm Tube secured with: Tape Dental Injury: Teeth and Oropharynx as per pre-operative assessment  Comments: DL grade 3 view, unable to pass tube.  Mac 4 glidescope utilized with grade 1 view

## 2017-09-30 ENCOUNTER — Inpatient Hospital Stay: Payer: Self-pay

## 2017-09-30 DIAGNOSIS — B9561 Methicillin susceptible Staphylococcus aureus infection as the cause of diseases classified elsewhere: Secondary | ICD-10-CM

## 2017-09-30 DIAGNOSIS — M00011 Staphylococcal arthritis, right shoulder: Principal | ICD-10-CM

## 2017-09-30 DIAGNOSIS — Z95828 Presence of other vascular implants and grafts: Secondary | ICD-10-CM

## 2017-09-30 LAB — SYNOVIAL CELL COUNT + DIFF, W/ CRYSTALS
Basophils, %: 0 %
Eosinophils-Synovial: 0 % (ref 0–2)
Lymphocytes-Synovial Fld: 21 % (ref 0–74)
Monocyte/Macrophage: 14 % (ref 0–69)
Neutrophil, Synovial: 65 % — ABNORMAL HIGH (ref 0–24)
Synoviocytes, %: 0 % (ref 0–15)
WBC, Synovial: 15320 cells/uL — ABNORMAL HIGH (ref <150)

## 2017-09-30 LAB — SURGICAL PCR SCREEN
MRSA, PCR: NEGATIVE
Staphylococcus aureus: POSITIVE — AB

## 2017-09-30 LAB — SEDIMENTATION RATE: Sed Rate: 75 mm/hr — ABNORMAL HIGH (ref 0–16)

## 2017-09-30 LAB — VANCOMYCIN, TROUGH: Vancomycin Tr: 15 ug/mL (ref 15–20)

## 2017-09-30 LAB — C-REACTIVE PROTEIN: CRP: 5.4 mg/dL — ABNORMAL HIGH (ref <1.0)

## 2017-09-30 MED ORDER — SODIUM CHLORIDE 0.9% FLUSH
10.0000 mL | INTRAVENOUS | Status: DC | PRN
  Administered 2017-10-02: 10 mL
  Filled 2017-09-30: qty 40

## 2017-09-30 MED ORDER — CEFAZOLIN SODIUM-DEXTROSE 2-4 GM/100ML-% IV SOLN
2.0000 g | Freq: Three times a day (TID) | INTRAVENOUS | Status: DC
  Administered 2017-09-30 – 2017-10-02 (×7): 2 g via INTRAVENOUS
  Filled 2017-09-30 (×8): qty 100

## 2017-09-30 NOTE — Progress Notes (Signed)
Spoke with Dr [name] Flattery re PICC placement.  States it is okay to place PICC in left arm today.

## 2017-09-30 NOTE — Progress Notes (Signed)
   Subjective:  Patient reports pain as mild.    Objective:   VITALS:   Vitals:   09/29/17 1647 09/29/17 1900 09/30/17 0007 09/30/17 0509  BP: (!) 153/71 120/67 132/69 123/63  Pulse: 91 94 87 82  Resp: 16 16 18 17   Temp: 98.4 F (36.9 C) 98.4 F (36.9 C) 97.6 F (36.4 C) 98.1 F (36.7 C)  TempSrc: Oral Oral Oral Oral  SpO2: 99% 95% 100% 98%  Weight:      Height:        Neurologically intact Neurovascular intact Sensation intact distally Intact pulses distally   Lab Results  Component Value Date   WBC 10.8 (H) 09/29/2017   HGB 14.0 09/29/2017   HCT 42.8 09/29/2017   MCV 85.8 09/29/2017   PLT 255 09/29/2017     Assessment/Plan:  1 Day Post-Op   - Expected postop acute blood loss anemia - will monitor for symptoms - Up with PT/OT - DVT ppx - SCDs, ambulation - WBAT operative extremity - Pain control - appreciate ID following - cultures pending - continue vanc and ceftriaxone  Eduard Roux 09/30/2017, 8:20 AM [phone]

## 2017-09-30 NOTE — Progress Notes (Signed)
Pharmacy Antibiotic Note  [name] is a 42 y.o. male admitted on 09/29/2017 with septic joint, now s/p arthrotomy on 09/29/17.  Pharmacy has been consulted for vancomycin dosing.  He is also on Rocephin.  Vancomycin trough was ordered for tonight but a level was drawn this morning.  Level is acceptable but not a true steady-state level.  Renal function stable, afebrile, WBC 10.8.   Plan: Continue vanc 1266m IV Q8H CTX 2gm IV Q24H per ID Monitor renal fxn, micro data, VT tomorrow    Height: 5' 9"  (175.3 cm) Weight: 281 lb (127.5 kg) IBW/kg (Calculated) : 70.7  Temp (24hrs), Avg:98.3 F (36.8 C), Min:97.6 F (36.4 C), Max:98.6 F (37 C)  Recent Labs  Lab 09/29/17 1143 09/30/17 0759  WBC 10.8*  --   CREATININE 0.74  --   VANCOTROUGH  --  15    Estimated Creatinine Clearance: 157.3 mL/min (by C-G formula based on SCr of 0.74 mg/dL).    No Known Allergies   Vanc 2/22>> CTX 2/22>>  2/23 VR = 15 mcg/mL after loading dose and 1 maintenance dose  2/22 surgical abscess: Staph aureus (preliminary) 2/22 surgical tissue: Staph aureus (preliminary) 2/22 surgical scree PCR - MSSA   Beronica Lansdale D. DMina Marble PharmD, BCPS Pager:  [phone]/23/2019, 11:23 AM

## 2017-09-30 NOTE — Progress Notes (Addendum)
    [place] for Infectious Disease    Date of Admission:  09/29/2017   Total days of antibiotics 2        Day 2 vanco           ID: [name] is a 42 y.o. male with staph aureus Active Problems:   Septic arthritis of right acromioclavicular joint ([place])    Subjective: Mild pain to right shoulder, had picc line placed without difficulty  Medications:  . lisinopril  10 mg Oral Daily  . loratadine  10 mg Oral Daily  . mometasone-formoterol  2 puff Inhalation BID  . pantoprazole  40 mg Oral Daily  . triamcinolone  1 spray Nasal Daily    Objective: Vital signs in last 24 hours: Temp:  [97.6 F (36.4 C)-99.3 F (37.4 C)] 99.3 F (37.4 C) (02/23 1533) Pulse Rate:  [82-97] 97 (02/23 1533) Resp:  [16-18] 16 (02/23 1533) BP: (120-153)/(63-76) 120/76 (02/23 1533) SpO2:  [95 %-100 %] 98 % (02/23 1533) Physical Exam  Constitutional: He is oriented to person, place, and time. He appears well-developed and well-nourished. No distress.  HENT:  Mouth/Throat: Oropharynx is clear and moist. No oropharyngeal exudate.  Cardiovascular: Normal rate, regular rhythm and normal heart sounds. Exam reveals no gallop and no friction rub.  No murmur heard.  Pulmonary/Chest: Effort normal and breath sounds normal. No respiratory distress. He has no wheezes.  Abdominal: Soft. Bowel sounds are normal. He exhibits no distension. There is no tenderness.  Ext: left arm picc line is c/d/i. Right shoulder with accordian drain in place Psychiatric: He has a normal mood and affect. His behavior is normal.     Lab Results Recent Labs    09/29/17 1143  WBC 10.8*  HGB 14.0  HCT 42.8  NA 136  K 4.4  CL 102  CO2 21*  BUN 15  CREATININE 0.74   Liver Panel No results for input(s): PROT, ALBUMIN, AST, ALT, ALKPHOS, BILITOT, BILIDIR, IBILI in the last 72 hours. Sedimentation Rate Recent Labs    09/30/17 0536  ESRSEDRATE 75*   C-Reactive Protein Recent Labs    09/29/17 1659  09/30/17 0536  CRP 6.6* 5.4*    Microbiology: Staph aureus - susceptibilities pending Studies/Results: Xr Shoulder Right  Result Date: 09/29/2017 Enlargement of distal clavicle with erosion worrisome for osteomyelitis  [place] Ekg Site Rite  Result Date: 09/30/2017 If Site Rite image not attached, placement could not be confirmed due to current cardiac rhythm.    Assessment/Plan: AC septic arthritis with staph aureus = will add cefazolin. Await sensitivities to decide if he needs 6 wk of ancef vs vancomycin.  He reports having fever chills on Monday = will watch for fevers, if so, please get blood cx.   [facility] for Infectious Diseases Cell: [phone] Pager: [phone]  09/30/2017, 4:01 PM

## 2017-09-30 NOTE — Evaluation (Signed)
Occupational Therapy Evaluation Patient Details Name: [name] MRN: [id number] DOB: Mar 29, 1976 Today's Date: 09/30/2017    History of Present Illness Pt is a 42 y.o. male who was admitted for surgical treatment of R acromioclavicular joint infection. Now s/p arthrotomy of R AC joint, irrigation and debridement of R distal clavicle, and partial excision of distal clavicle. He has a PMH significant for asthma, HTN, and pancreatitis. He underwent lumbar disc surgery in 2002.   Clinical Impression   PTA, pt was independent with ADL and functional mobility. He currently requires min assist for UB dressing and bathing tasks and supervision for toilet transfers. Pt demonstrating compensatory upper trap hiking when reaching with R UE in both flexion and abduction. He does demonstrate WFL sensation and grasp strength with R UE but limited at R shoulder secondary to post-operative pain. Per MD, not AROM restrictions and WBAT with R UE. Initiated education concerning R shoulder gentle AROM and pendulums and plan to progress with HEP next session. OT will continue to follow while admitted to address HEP as well as improve independence with ADL.    Follow Up Recommendations  Follow surgeon's recommendation for DC plan and follow-up therapies;Supervision - Intermittent    Equipment Recommendations  None recommended by OT    Recommendations for Other Services       Precautions / Restrictions Precautions Precaution Comments: Spoke with Dr. Erlinda Hong and pt with no ROM restrictions as of 09/30/17. Restrictions Weight Bearing Restrictions: Yes RUE Weight Bearing: Weight bearing as tolerated      Mobility Bed Mobility               General bed mobility comments: Seated at EOB on my arrival  Transfers Overall transfer level: Needs assistance   Transfers: Sit to/from Stand Sit to Stand: Supervision         General transfer comment: Supervision for safety.    Balance Overall balance  assessment: Needs assistance Sitting-balance support: No upper extremity supported;Feet supported Sitting balance-Leahy Scale: Good     Standing balance support: No upper extremity supported;During functional activity Standing balance-Leahy Scale: Good Standing balance comment: slightly unstable with ambulation due to pinched nerve                           ADL either performed or assessed with clinical judgement   ADL Overall ADL's : Needs assistance/impaired Eating/Feeding: Set up;Sitting   Grooming: Supervision/safety;Standing   Upper Body Bathing: Minimal assistance;Sitting   Lower Body Bathing: Set up;Sit to/from stand   Upper Body Dressing : Minimal assistance;Sitting   Lower Body Dressing: Set up;Sit to/from stand   Toilet Transfer: Supervision/safety;Ambulation;Regular Toilet   Toileting- Water quality scientist and Hygiene: Supervision/safety;Sit to/from stand       Functional mobility during ADLs: Supervision/safety General ADL Comments: Initiated education concerning safe UB dressing and bathing strategies.      Vision Patient Visual Report: No change from baseline Vision Assessment?: No apparent visual deficits     Perception     Praxis      Pertinent Vitals/Pain Pain Assessment: Faces Faces Pain Scale: Hurts a little bit Pain Location: R shoulder Pain Descriptors / Indicators: Aching;Operative site guarding Pain Intervention(s): Limited activity within patient's tolerance;Monitored during session;Repositioned     Hand Dominance Right   Extremity/Trunk Assessment Upper Extremity Assessment Upper Extremity Assessment: RUE deficits/detail RUE Deficits / Details: Limited AROM R shoulder in flexion and abduction to 0-90 degrees. WFL at elbow and hand. Grasp strength  WFL. Sensation WFL.    Lower Extremity Assessment Lower Extremity Assessment: Overall WFL for tasks assessed(limps at baseline due to pinched nerve)       Communication  Communication Communication: No difficulties   Cognition Arousal/Alertness: Awake/alert Behavior During Therapy: WFL for tasks assessed/performed Overall Cognitive Status: Within Functional Limits for tasks assessed                                     General Comments  Multiple family members present and engaged in session.     Exercises Exercises: Other exercises Other Exercises Other Exercises: Educated pt concerning pain free pendulums with L UE supported on counter.  Other Exercises: Educated pt concerning pain free R forward shoulder flexion, R shoulder abduction, and R UE reaches to touch L shoulder ([place] joint region). Pt able to complete 0-90 flexion and abduction without upper trap compensatory hiking and with pain free motion.     Shoulder Instructions      Home Living Family/patient expects to be discharged to:: Private residence Living Arrangements: Spouse/significant other;Children Available Help at Discharge: Family Type of Home: House Home Access: Stairs to enter Technical brewer of Steps: 4   Home Layout: One level     Bathroom Shower/Tub: Occupational psychologist: Standard     Home Equipment: None   Additional Comments: Need to verify shower set-up      Prior Functioning/Environment Level of Independence: Independent                 OT Problem List:        OT Treatment/Interventions:      OT Goals(Current goals can be found in the care plan section) Acute Rehab OT Goals Patient Stated Goal: go home soon and be more comfortable with PICC line OT Goal Formulation: With patient/family Time For Goal Achievement: 10/14/17 Potential to Achieve Goals: Good ADL Goals Pt Will Perform Upper Body Bathing: (P) with modified independence;standing Pt Will Perform Upper Body Dressing: (P) with modified independence;sitting Pt/caregiver will Perform Home Exercise Program: (P) Right Upper extremity;With written HEP  provided(gentle, pain free AROM; pendulums)  OT Frequency:     Barriers to D/C:            Co-evaluation              AM-PAC PT "6 Clicks" Daily Activity     Outcome Measure Help from another person eating meals?: A Little Help from another person taking care of personal grooming?: A Little Help from another person toileting, which includes using toliet, bedpan, or urinal?: A Little Help from another person bathing (including washing, rinsing, drying)?: A Little Help from another person to put on and taking off regular upper body clothing?: A Little Help from another person to put on and taking off regular lower body clothing?: A Little 6 Click Score: 18   End of Session    Activity Tolerance: Patient tolerated treatment well Patient left: in bed;with family/visitor present;with call bell/phone within reach(seated at EOB)                   Time: 8937-3428 OT Time Calculation (min): 14 min Charges:  OT General Charges $OT Visit: 1 Visit OT Evaluation $OT Eval Moderate Complexity: 1 Mod G-Codes:     Norman Herrlich, MS OTR/L  Pager: [place] [facility] Benedict[place] Capo[place] 09/30/2017, 5:43 PM

## 2017-09-30 NOTE — Progress Notes (Signed)
Spent approximately one hour d/w pt and wife, re PICC, options and risks and benefits prior to procedure. Both agreeable and verbalize understanding.

## 2017-09-30 NOTE — Progress Notes (Signed)
Peripherally Inserted Central Catheter/Midline Placement  The IV Nurse has discussed with the patient and/or persons authorized to consent for the patient, the purpose of this procedure and the potential benefits and risks involved with this procedure.  The benefits include less needle sticks, lab draws from the catheter, and the patient may be discharged home with the catheter. Risks include, but not limited to, infection, bleeding, blood clot (thrombus formation), and puncture of an artery; nerve damage and irregular heartbeat and possibility to perform a PICC exchange if needed/ordered by physician.  Alternatives to this procedure were also discussed.  Bard Power PICC patient education guide, fact sheet on infection prevention and patient information card has been provided to patient /or left at bedside.    PICC/Midline Placement Documentation  PICC Single Lumen 82/99/37 PICC Left Basilic 48 cm 2 cm (Active)  Indication for Insertion or Continuance of Line Home intravenous therapies (PICC only) 09/30/2017 12:00 PM  Exposed Catheter (cm) 2 cm 09/30/2017 12:00 PM  Site Assessment Clean;Dry;Intact 09/30/2017 12:00 PM  Line Status Flushed;Blood return noted;Saline locked 09/30/2017 12:00 PM  Dressing Type Transparent;Occlusive 09/30/2017 12:00 PM  Dressing Status Clean;Dry;Intact;Antimicrobial disc in place 09/30/2017 12:00 PM  Line Care Connections checked and tightened 09/30/2017 12:00 PM  Line Adjustment (NICU/IV Team Only) No 09/30/2017 12:00 PM  Dressing Intervention New dressing 09/30/2017 12:00 PM  Dressing Change Due 10/07/17 09/30/2017 12:00 PM       Lorenza Cambridge 09/30/2017, 12:47 PM

## 2017-09-30 NOTE — Progress Notes (Signed)
OT Cancellation Note  Patient Details Name: [name] MRN: [id number] DOB: [birth year]   Cancelled Treatment:    Reason Eval/Treat Not Completed: Patient at procedure or test/ unavailable. Pt having procedure on my arrival. Will check back as able to initiate OT evaluation.   Norman Herrlich, MS OTR/L  Pager: [place] A [name] 09/30/2017, 11:30 AM

## 2017-10-01 LAB — HEMOGLOBIN A1C
Hgb A1c MFr Bld: 5.7 % — ABNORMAL HIGH (ref 4.8–5.6)
Mean Plasma Glucose: 117 mg/dL

## 2017-10-01 LAB — HCV AB W REFLEX TO QUANT PCR: HCV Ab: <0.1 s/co ratio (ref 0.0–0.9)

## 2017-10-01 LAB — HCV INTERPRETATION

## 2017-10-01 LAB — VANCOMYCIN, TROUGH: Vancomycin Tr: 23 ug/mL (ref 15–20)

## 2017-10-01 MED ORDER — SODIUM CHLORIDE 0.9 % IV SOLN
1500.0000 mg | Freq: Two times a day (BID) | INTRAVENOUS | Status: DC
  Administered 2017-10-01 – 2017-10-02 (×2): 1500 mg via INTRAVENOUS
  Filled 2017-10-01 (×3): qty 1500

## 2017-10-01 NOTE — Progress Notes (Addendum)
Occupational Therapy Treatment Patient Details Name: [name] MRN: [id number] DOB: 1976/04/05 Today's Date: 10/01/2017    History of present illness Pt is a 42 y.o. male who was admitted for surgical treatment of R acromioclavicular joint infection. Now s/p arthrotomy of R AC joint, irrigation and debridement of R distal clavicle, and partial excision of distal clavicle. He has a PMH significant for asthma, HTN, and pancreatitis. He underwent lumbar disc surgery in 2002.   OT comments  Pt demonstrating progress toward OT goals this session. He was limited with LB ADL and functional mobility secondary to hip pain today and reports that this occurs due to his baseline problems with sciatic nerve. Progressed with HEP education for pain free R shoulder pendulums and AROM as detailed below. Pt able to complete with verbal cues for technique and to minimize R upper trap hiking and compensatory trunk movement. He would benefit from continued OT services while admitted to improve independence and safety with ADL and functional mobility prior to returning home.   Follow Up Recommendations  Follow surgeon's recommendation for DC plan and follow-up therapies;Supervision - Intermittent    Equipment Recommendations  None recommended by OT    Recommendations for Other Services      Precautions / Restrictions Precautions Precaution Comments: Spoke with Dr. Erlinda Hong and pt with no ROM restrictions as of 09/30/17. Restrictions Weight Bearing Restrictions: No RUE Weight Bearing: Weight bearing as tolerated       Mobility Bed Mobility Overal bed mobility: Modified Independent                Transfers Overall transfer level: Needs assistance   Transfers: Sit to/from Stand Sit to Stand: Supervision         General transfer comment: Supervision for safety.    Balance Overall balance assessment: Needs assistance Sitting-balance support: No upper extremity supported;Feet supported Sitting  balance-Leahy Scale: Good     Standing balance support: No upper extremity supported;During functional activity Standing balance-Leahy Scale: Good Standing balance comment: slightly unstable gait due to sciatica pain                           ADL either performed or assessed with clinical judgement   ADL Overall ADL's : Needs assistance/impaired Eating/Feeding: Set up;Sitting   Grooming: Supervision/safety;Standing   Upper Body Bathing: Minimal assistance;Sitting   Lower Body Bathing: Minimal assistance;Sit to/from stand   Upper Body Dressing : Minimal assistance;Sitting   Lower Body Dressing: Minimal assistance;Sit to/from stand Lower Body Dressing Details (indicate cue type and reason): Limited today due to sciatic nerve pain. Toilet Transfer: Supervision/safety;Ambulation;Regular Toilet   Toileting- Water quality scientist and Hygiene: Supervision/safety;Sit to/from stand       Functional mobility during ADLs: Supervision/safety General ADL Comments: Pt is limited in LB ADL and functional mobility today secondary to hip/sciatica pain. He reports this happens frequently at baseline.      Vision   Vision Assessment?: No apparent visual deficits   Perception     Praxis      Cognition Arousal/Alertness: Awake/alert Behavior During Therapy: WFL for tasks assessed/performed Overall Cognitive Status: Within Functional Limits for tasks assessed                                          Exercises Exercises: General Upper Extremity General Exercises - Upper Extremity Shoulder Flexion: AROM;Right;10 reps(0-90  degrees only) Shoulder ABduction: AROM;Right;10 reps(0-90; able to achieve 0-60 without compensatory movement) Other Exercises Other Exercises: Educated pt concerning pain free pendulums (forward/back, lateral, circles) with L UE supported on counter.  Other Exercises: Educated pt concerning reaching R UE toward L shoulder for 10  repetitions.   Shoulder Instructions       General Comments No family present today. MD present to remove drain during session.     Pertinent Vitals/ Pain       Pain Assessment: Faces Faces Pain Scale: Hurts a little bit Pain Location: R shoulder Pain Descriptors / Indicators: Aching;Operative site guarding Pain Intervention(s): Limited activity within patient's tolerance;Monitored during session;Repositioned  Home Living                                          Prior Functioning/Environment              Frequency  Min 2X/week        Progress Toward Goals  OT Goals(current goals can now be found in the care plan section)  Progress towards OT goals: Progressing toward goals  Acute Rehab OT Goals Patient Stated Goal: go home soon and be more comfortable with PICC line OT Goal Formulation: With patient/family Time For Goal Achievement: 10/14/17 Potential to Achieve Goals: Good  Plan Discharge plan remains appropriate    Co-evaluation                 AM-PAC PT "6 Clicks" Daily Activity     Outcome Measure   Help from another person eating meals?: A Little Help from another person taking care of personal grooming?: A Little Help from another person toileting, which includes using toliet, bedpan, or urinal?: A Little Help from another person bathing (including washing, rinsing, drying)?: A Little Help from another person to put on and taking off regular upper body clothing?: A Little Help from another person to put on and taking off regular lower body clothing?: A Little 6 Click Score: 18    End of Session    OT Visit Diagnosis: Other abnormalities of gait and mobility (R26.89);Pain Pain - Right/Left: Right Pain - part of body: Shoulder   Activity Tolerance Patient tolerated treatment well   Patient Left with call bell/phone within reach;in bed(with lab personnel in room; seated at EOB)   Nurse Communication          Time:  8937-3428 OT Time Calculation (min): 18 min  Charges: OT General Charges $OT Visit: 1 Visit OT Treatments $Therapeutic Exercise: 8-22 mins  Norman Herrlich, MS OTR/L  Pager: [place] 10/01/2017, 10:35 AM

## 2017-10-01 NOTE — Progress Notes (Signed)
Pt vanc trough has still not resulted called lab they are working on it when it results I will hang the [facility]

## 2017-10-01 NOTE — Progress Notes (Signed)
[facility] Infusion Coordinator will follow pt with ID team to support home IV ABX as ordered at DC.  If patient discharges after hours, please call [phone].   Larry Sierras 10/01/2017, 9:15 PM

## 2017-10-01 NOTE — Progress Notes (Signed)
Pt's shoulder dressing saturated ABD pad and was leaking from the corner. RN changed dressing and cleaned pt up. And notified MD.

## 2017-10-01 NOTE — Progress Notes (Signed)
   Subjective:  Patient reports pain as mild.    Objective:   VITALS:   Vitals:   09/30/17 1533 09/30/17 2000 09/30/17 2017 10/01/17 0555  BP: 120/76  127/60 (!) 148/82  Pulse: 97  100   Resp: 16  18 18   Temp: 99.3 F (37.4 C)  98.7 F (37.1 C) 99 F (37.2 C)  TempSrc: Oral  Oral Oral  SpO2: 98% 98% 96% 96%  Weight:      Height:        Neurologically intact Neurovascular intact Sensation intact distally Intact pulses distally   Lab Results  Component Value Date   WBC 10.8 (H) 09/29/2017   HGB 14.0 09/29/2017   HCT 42.8 09/29/2017   MCV 85.8 09/29/2017   PLT 255 09/29/2017     Assessment/Plan:  2 Days Post-Op   - drain removed - RUE WBAT - cultures staph, waiting for speciation - continue vanc and ancef for now - appreciate ID assistance - PICC has been placed - dc Monday with HHRN - dressing change before he leaves  Eduard Roux 10/01/2017, 9:41 AM [phone]

## 2017-10-01 NOTE — Progress Notes (Signed)
Pharmacy Antibiotic Note  [name] is a 42 y.o. male admitted on 09/29/2017 with septic joint, now s/p arthrotomy on 09/29/17.  Pharmacy has been consulted for vancomycin dosing.  Patient continues on Ancef until cultures finalize.  Vancomycin trough is slightly supra-therapeutic.  Renal function stable, afebrile, WBC mildly elevated.   Plan: Change vanc to 1538m IV Q12H given possibility of long-term antibiotic Ancef 2gm IV Q8H per ID Monitor renal fxn, micro data, repeat vanc trough at new Css BMET in AM   Height: 5' 9"  (175.3 cm) Weight: 281 lb (127.5 kg) IBW/kg (Calculated) : 70.7  Temp (24hrs), Avg:99 F (37.2 C), Min:98.7 F (37.1 C), Max:99.3 F (37.4 C)  Recent Labs  Lab 09/29/17 1143 09/30/17 0759 10/01/17 0939  WBC 10.8*  --   --   CREATININE 0.74  --   --   VANCOTROUGH  --  15 23*    Estimated Creatinine Clearance: 157.3 mL/min (by C-G formula based on SCr of 0.74 mg/dL).    No Known Allergies  Vanc 2/22 >>  CTX 2/22 >>   2/23 VR = 15 mcg/mL after loading dose and 1 maintenance dose 2/24 VT= 23 mcg/mL on 12586mq8 >> 150067m12   2/22 surgical abscess: Staph aureus (preliminary) 2/22 surgical tissue: Staph aureus (preliminary) 2/22 surgical scree PCR - MSSA   [name] D. DanMina MarbleharmD, BCPS Pager:  [phone]/2019, 1:09 PM

## 2017-10-01 NOTE — Progress Notes (Signed)
PT worried about not having a bowel movement since Friday morning and she stated that he usually goes daily and does not want to get stopped up RN provided mirilaxand encouraged him to walk around still no success so he asked to a laxative so RN provided Mag citrate.

## 2017-10-02 ENCOUNTER — Other Ambulatory Visit (INDEPENDENT_AMBULATORY_CARE_PROVIDER_SITE_OTHER): Payer: Self-pay | Admitting: Physician Assistant

## 2017-10-02 ENCOUNTER — Telehealth (INDEPENDENT_AMBULATORY_CARE_PROVIDER_SITE_OTHER): Payer: Self-pay | Admitting: Orthopaedic Surgery

## 2017-10-02 ENCOUNTER — Encounter (HOSPITAL_COMMUNITY): Payer: Self-pay | Admitting: Orthopaedic Surgery

## 2017-10-02 DIAGNOSIS — R509 Fever, unspecified: Secondary | ICD-10-CM

## 2017-10-02 DIAGNOSIS — A499 Bacterial infection, unspecified: Secondary | ICD-10-CM

## 2017-10-02 LAB — BASIC METABOLIC PANEL
Anion gap: 9 (ref 5–15)
BUN: 10 mg/dL (ref 6–20)
CO2: 28 mmol/L (ref 22–32)
Calcium: 8.8 mg/dL — ABNORMAL LOW (ref 8.9–10.3)
Chloride: 101 mmol/L (ref 101–111)
Creatinine, Ser: 0.76 mg/dL (ref 0.61–1.24)
GFR calc Af Amer: >60 mL/min (ref >60)
GFR calc non Af Amer: >60 mL/min (ref >60)
Glucose, Bld: 101 mg/dL — ABNORMAL HIGH (ref 65–99)
Potassium: 4.1 mmol/L (ref 3.5–5.1)
Sodium: 138 mmol/L (ref 135–145)

## 2017-10-02 LAB — GRAM STAIN
MICRO NUMBER:: 90237700
SPECIMEN QUALITY:: ADEQUATE

## 2017-10-02 MED ORDER — BISACODYL 5 MG PO TBEC: 5.0000 mg | DELAYED_RELEASE_TABLET | Freq: Every day | ORAL | 0 refills | Status: DC | PRN

## 2017-10-02 MED ORDER — DOCUSATE SODIUM 100 MG PO CAPS: 100.0000 mg | ORAL_CAPSULE | Freq: Two times a day (BID) | ORAL | 0 refills | Status: DC

## 2017-10-02 MED ORDER — HYDROCODONE-ACETAMINOPHEN 5-325 MG PO TABS: 1.0000 | ORAL_TABLET | Freq: Four times a day (QID) | ORAL | 0 refills | Status: DC | PRN

## 2017-10-02 MED ORDER — CEFAZOLIN IV (FOR PTA / DISCHARGE USE ONLY): 2.0000 g | Freq: Three times a day (TID) | INTRAVENOUS | 0 refills | Status: DC

## 2017-10-02 MED ORDER — METHOCARBAMOL 500 MG PO TABS: 500.0000 mg | ORAL_TABLET | Freq: Four times a day (QID) | ORAL | 0 refills | Status: DC

## 2017-10-02 MED ORDER — HEPARIN SOD (PORK) LOCK FLUSH 100 UNIT/ML IV SOLN
250.0000 [IU] | INTRAVENOUS | Status: AC | PRN
  Administered 2017-10-02: 250 [IU]

## 2017-10-02 MED ORDER — ONDANSETRON HCL 4 MG PO TABS: 4.0000 mg | ORAL_TABLET | Freq: Three times a day (TID) | ORAL | 0 refills | Status: DC | PRN

## 2017-10-02 NOTE — Care Management Note (Signed)
Case Management Note  Patient Details  Name: [name] MRN: [id number] Date of Birth: 1976/05/13  Subjective/Objective:  42 yr old gentleman s/p I&D of distal clavicle with partial excision of distal clavicle.                Action/Plan: Case manager spoke with patient concerning discharge plan. Patient will need IV antibiotics until November 11, 2017. Choice for Home Health agency was offered. Patient is setup with [facility]. He will have family support at discharge, wife will be learning how to administer antibiotics.    Expected Discharge Date:  10/02/17               Expected Discharge Plan:  [place]  In-House Referral:  NA  Discharge planning Services  CM Consult  Post Acute Care Choice:  Home Health Choice offered to:  Patient  DME Arranged:  N/A DME Agency:  NA  HH Arranged:  RN, IV Antibiotics HH Agency:  [facility]  Status of Service:  Completed, signed off  If discussed at [place] of Stay Meetings, dates discussed:    Additional Comments:  Ninfa Meeker, RN 10/02/2017, 11:30 AM

## 2017-10-02 NOTE — Anesthesia Postprocedure Evaluation (Signed)
Anesthesia Post Note  Patient: [name]  Procedure(s) Performed: IRRIGATION AND DEBRIDEMENT SHOULDER (Right Shoulder)     Patient location during evaluation: PACU Anesthesia Type: General Level of consciousness: awake Pain management: pain level controlled Vital Signs Assessment: post-procedure vital signs reviewed and stable Respiratory status: spontaneous breathing Cardiovascular status: stable Postop Assessment: no apparent nausea or vomiting Anesthetic complications: no    Last Vitals:  Vitals:   10/01/17 2021 10/02/17 0517  BP: 135/68 (!) 155/81  Pulse: 95 88  Resp: 16 18  Temp: (!) 36.3 C 36.9 C  SpO2: 100% 97%    Last Pain:  Vitals:   10/02/17 0856  TempSrc:   PainSc: 9    Pain Goal: Patients Stated Pain Goal: 3 (09/29/17 1640)               Adaiah Jaskot JR,JOHN Mateo Flow

## 2017-10-02 NOTE — Telephone Encounter (Signed)
Called back and Tanita will be faxing back results to [place] about this.

## 2017-10-02 NOTE — Telephone Encounter (Signed)
[facility]  Critcial result  Selinda Flavin   [phone]  Ref# [id number]

## 2017-10-02 NOTE — Progress Notes (Signed)
Discharge teaching complete. Meds, diet, activity, follow up appointments and incision care reviewed and all questions answered. Copy of instructions and prescriptions given to patient. Patient discharged home via wheelchair with mother.

## 2017-10-02 NOTE — Progress Notes (Signed)
Occupational Therapy Treatment Patient Details Name: [name] MRN: [id number] DOB: 27-Oct-1975 Today's Date: 10/02/2017    History of present illness Pt is a 42 y.o. male who was admitted for surgical treatment of R acromioclavicular joint infection. Now s/p arthrotomy of R AC joint, irrigation and debridement of R distal clavicle, and partial excision of distal clavicle. He has a PMH significant for asthma, HTN, and pancreatitis. He underwent lumbar disc surgery in 2002.   OT comments  Pt demonstrated and verbal discussed each exercise listed on HEP and did not require cues for proper technique. OT educated to on dressing techniques secondary to increased pain and weakness with R UE for functional tasks. Pt verbalized understanding. Pt had several questions regarding discharge and directed to ask physician. Pt continues to benefit from OT intervention.    Follow Up Recommendations  Follow surgeon's recommendation for DC plan and follow-up therapies;Supervision - Intermittent    Equipment Recommendations  None recommended by OT    Recommendations for Other Services      Precautions / Restrictions Precautions Precaution Comments:  no ROM restrictions as of 09/30/17. Restrictions Weight Bearing Restrictions: No RUE Weight Bearing: Weight bearing as tolerated       Mobility Bed Mobility Overal bed mobility: Modified Independent     General bed mobility comments: Seated at EOB on my arrival  Transfers               ADL either performed or assessed with clinical judgement   ADL Overall ADL's : Needs assistance/impaired      General ADL Comments: OT reviewed and demonstrated donning of UB clothing items to increase independence and decrease pain with movement. OT also discussing best way to perform hygiene and wash UB secondary to pain and weakness.      Vision Patient Visual Report: No change from baseline Vision Assessment?: No apparent visual deficits           Cognition Arousal/Alertness: Awake/alert Behavior During Therapy: WFL for tasks assessed/performed Overall Cognitive Status: Within Functional Limits for tasks assessed                        Pertinent Vitals/ Pain       Pain Assessment: Faces Faces Pain Scale: Hurts a little bit Pain Location: R shoulder Pain Descriptors / Indicators: Aching;Operative site guarding Pain Intervention(s): Monitored during session;Repositioned         Frequency  Min 2X/week        Progress Toward Goals  OT Goals(current goals can now be found in the care plan section)  Progress towards OT goals: Progressing toward goals  Acute Rehab OT Goals Patient Stated Goal: go home soon  OT Goal Formulation: With patient/family Time For Goal Achievement: 10/16/17 Potential to Achieve Goals: Good  Plan Discharge plan remains appropriate       AM-PAC PT "6 Clicks" Daily Activity     Outcome Measure   Help from another person eating meals?: A Little Help from another person taking care of personal grooming?: A Little Help from another person toileting, which includes using toliet, bedpan, or urinal?: A Little Help from another person bathing (including washing, rinsing, drying)?: A Little Help from another person to put on and taking off regular upper body clothing?: A Little Help from another person to put on and taking off regular lower body clothing?: A Little 6 Click Score: 18    End of Session    OT Visit Diagnosis: Other  abnormalities of gait and mobility (R26.89);Pain Pain - Right/Left: Right Pain - part of body: Shoulder   Activity Tolerance Patient tolerated treatment well   Patient Left with call bell/phone within reach;in bed;with family/visitor present   Nurse Communication          Time: 6599-3570 OT Time Calculation (min): 24 min  Charges: OT General Charges $OT Visit: 1 Visit OT Treatments $Self Care/Home Management : 8-22 mins $Therapeutic Exercise: 8-22  mins   Dreshon Proffit P , MS, OTR/L, CBIS 10/02/2017, 9:53 AM

## 2017-10-02 NOTE — Progress Notes (Signed)
Patient ID: [name], male   DOB: 09-May-1976, 42 y.o.   MRN: [id number]  Will fax cefazolin rx to [facility] as well as 5N to be put in patient chart.  Once this is in chart and [facility] aware, patient will be ok to be d/c.  Please call me with questions.  [phone]

## 2017-10-02 NOTE — Progress Notes (Signed)
PHARMACY CONSULT NOTE FOR:  OUTPATIENT  PARENTERAL ANTIBIOTIC THERAPY (OPAT)  Indication: Septic Arthritis Regimen: Ancef 2g IV q8h End date: 11/11/2017  IV antibiotic discharge orders are pended. To discharging provider:  please sign these orders via discharge navigator,  Select New Orders & click on the button choice - Manage This Unsigned Work.     Thank you for allowing pharmacy to be a part of this patient's care.  Rivaldo Hineman D. Angles Trevizo, PharmD, BCPS Clinical Pharmacist Clinical Phone for 10/02/2017 until 3:30pm: [phone] If after 3:30pm, please call main pharmacy at [phone] 10/02/2017 10:44 AM

## 2017-10-02 NOTE — Progress Notes (Signed)
Subjective: 3 Days Post-Op Procedure(s) (LRB): IRRIGATION AND DEBRIDEMENT SHOULDER (Right) Patient reports pain as mild.  Doing well this am  Objective: Vital signs in last 24 hours: Temp:  [97.3 F (36.3 C)-98.5 F (36.9 C)] 98.5 F (36.9 C) (02/25 0517) Pulse Rate:  [88-99] 88 (02/25 0517) Resp:  [16-18] 18 (02/25 0517) BP: (135-155)/(68-81) 155/81 (02/25 0517) SpO2:  [97 %-100 %] 97 % (02/25 0517)  Intake/Output from previous day: 02/24 0701 - 02/25 0700 In: 500 [IV Piggyback:500] Out: -  Intake/Output this shift: No intake/output data recorded.  Recent Labs    09/29/17 1143  HGB 14.0   Recent Labs    09/29/17 1143  WBC 10.8*  RBC 4.99  HCT 42.8  PLT 255   Recent Labs    09/29/17 1143 10/02/17 0338  NA 136 138  K 4.4 4.1  CL 102 101  CO2 21* 28  BUN 15 10  CREATININE 0.74 0.76  GLUCOSE 77 101*  CALCIUM 8.9 8.8*   No results for input(s): LABPT, INR in the last 72 hours.  Neurologically intact Neurovascular intact Sensation intact distally Intact pulses distally Dorsiflexion/Plantar flexion intact Incision: dressing C/D/I No cellulitis present Compartment soft  Assessment/Plan: 3 Days Post-Op Procedure(s) (LRB): IRRIGATION AND DEBRIDEMENT SHOULDER (Right) Advance diet Up with therapy Discharge home with home health nursing today (once ID has made recs on abx) cx grew staph a. Sensitivities are back.  Appreciate recs from ID on which abx to write prior to d/c Dry dressing change prn  [name] 10/02/2017, 7:47 AM

## 2017-10-02 NOTE — Discharge Summary (Signed)
Patient ID: [name] MRN: [id number] DOB/AGE: [birth year] 42 y.o.  Admit date: 09/29/2017 Discharge date: 10/02/2017  Admission Diagnoses:  Active Problems:   Septic arthritis of right acromioclavicular joint [facility])   Discharge Diagnoses:  Same  Past Medical History:  Diagnosis Date  . Asthma   . HTN (hypertension)   . Pancreatitis     Surgeries: Procedure(s): IRRIGATION AND DEBRIDEMENT SHOULDER on 09/29/2017   Consultants: ID  Discharged Condition: Improved  Hospital Course: [name] is an 42 y.o. male who was admitted 09/29/2017 for operative treatment of right shoulder AC joint infection. Patient has severe unremitting pain that affects sleep, daily activities, and work/hobbies. After pre-op clearance the patient was taken to the operating room on 09/29/2017 and underwent  Procedure(s): IRRIGATION AND DEBRIDEMENT SHOULDER.    Patient was given perioperative antibiotics:  Anti-infectives (From admission, onward)   Start     Dose/Rate Route Frequency Ordered Stop   10/01/17 1600  vancomycin (VANCOCIN) 1,500 mg in sodium chloride 0.9 % 500 mL IVPB     1,500 mg 250 mL/hr over 120 Minutes Intravenous Every 12 hours 10/01/17 1308     09/30/17 1400  ceFAZolin (ANCEF) IVPB 2g/100 mL premix     2 g 200 mL/hr over 30 Minutes Intravenous Every 8 hours 09/30/17 1151     09/30/17 0200  vancomycin (VANCOCIN) 1,250 mg in sodium chloride 0.9 % 250 mL IVPB  Status:  Discontinued     1,250 mg 166.7 mL/hr over 90 Minutes Intravenous Every 8 hours 09/29/17 1646 10/01/17 1308   09/29/17 1700  vancomycin (VANCOCIN) IVPB 1000 mg/200 mL premix     1,000 mg 200 mL/hr over 60 Minutes Intravenous  Once 09/29/17 1646 09/29/17 1931   09/29/17 1630  cefTRIAXone (ROCEPHIN) 2 g in sodium chloride 0.9 % 100 mL IVPB  Status:  Discontinued     2 g 200 mL/hr over 30 Minutes Intravenous Every 24 hours 09/29/17 1615 09/30/17 1151   09/29/17 1505  vancomycin (VANCOCIN) powder  Status:  Discontinued        As needed 09/29/17 1505 09/29/17 1505   09/29/17 1415  vancomycin (VANCOCIN) 500 mg in sodium chloride 0.9 % 100 mL IVPB     500 mg 100 mL/hr over 60 Minutes Intravenous  Once 09/29/17 1407 09/29/17 1500   09/29/17 1345  vancomycin (VANCOCIN) 1,500 mg in sodium chloride 0.9 % 500 mL IVPB  Status:  Discontinued     1,500 mg 250 mL/hr over 120 Minutes Intravenous STAT 09/29/17 1334 09/29/17 1425       Patient was given sequential compression devices, early ambulation, and chemoprophylaxis to prevent DVT.  Patient benefited maximally from hospital stay and there were no complications.    Recent vital signs:  Patient Vitals for the past 24 hrs:  BP Temp Temp src Pulse Resp SpO2  10/02/17 0517 (!) 155/81 98.5 F (36.9 C) Oral 88 18 97 %  10/01/17 2021 135/68 (!) 97.3 F (36.3 C) Oral 95 16 100 %  10/01/17 1400 136/70 98.4 F (36.9 C) Oral 99 18 98 %     Recent laboratory studies:  Recent Labs    09/29/17 1143 10/02/17 0338  WBC 10.8*  --   HGB 14.0  --   HCT 42.8  --   PLT 255  --   NA 136 138  K 4.4 4.1  CL 102 101  CO2 21* 28  BUN 15 10  CREATININE 0.74 0.76  GLUCOSE 77 101*  CALCIUM 8.9  8.8*     Discharge Medications:   Allergies as of 10/02/2017   No Known Allergies     Medication List    STOP taking these medications   naproxen sodium 220 MG tablet Commonly known as:  ALEVE     TAKE these medications   Albuterol Sulfate 108 (90 Base) MCG/ACT Aepb Commonly known as:  PROAIR RESPICLICK Inhale 2 puffs into the lungs every 4 (four) hours.   bisacodyl 5 MG EC tablet Commonly known as:  DULCOLAX Take 1 tablet (5 mg total) by mouth daily as needed for moderate constipation.   budesonide-formoterol 160-4.5 MCG/ACT inhaler Commonly known as:  SYMBICORT Inhale two puffs twice daily to prevent cough or wheeze. Rinse, gargle, and spit after use.   cetirizine 10 MG tablet Commonly known as:  ZYRTEC Take 10 mg by mouth daily.   cyclobenzaprine 10 MG  tablet Commonly known as:  FLEXERIL Take 1 tablet (10 mg total) by mouth 2 (two) times daily as needed for muscle spasms.   diclofenac 50 MG EC tablet Commonly known as:  VOLTAREN Take 1 tablet (50 mg total) by mouth 2 (two) times daily.   docusate sodium 100 MG capsule Commonly known as:  COLACE Take 1 capsule (100 mg total) by mouth 2 (two) times daily.   HYDROcodone-acetaminophen 5-325 MG tablet Commonly known as:  NORCO Take 1-2 tablets by mouth every 6 (six) hours as needed for moderate pain.   lisinopril 10 MG tablet Commonly known as:  PRINIVIL,ZESTRIL Take 10 mg by mouth daily.   NASACORT ALLERGY 24HR NA Place 1 spray into the nose daily.   omeprazole 20 MG capsule Commonly known as:  PRILOSEC Take 20 mg by mouth 2 (two) times daily before a meal.   ondansetron 4 MG tablet Commonly known as:  ZOFRAN Take 1 tablet (4 mg total) by mouth every 8 (eight) hours as needed for nausea or vomiting.       Diagnostic Studies: Dg Knee Complete 4 Views Left  Result Date: 09/16/2017 CLINICAL DATA:  42 year old male with intermittent left lower extremity pain from the hip to the knee for the past year EXAM: LEFT KNEE - COMPLETE 4+ VIEW COMPARISON:  Concurrently obtained radiographs of the pelvis and left hip FINDINGS: No evidence of fracture, dislocation, or joint effusion. No evidence of arthropathy or other focal bone abnormality. Soft tissues are unremarkable. IMPRESSION: Negative. Electronically Signed   By: Jacqulynn Cadet M.D.   On: 09/16/2017 10:47   Dg Foot 2 Views Left  Result Date: 09/11/2017 Please see detailed radiograph report in office note.  Dg Foot 2 Views Right  Result Date: 09/11/2017 Please see detailed radiograph report in office note.  Dg Hip Unilat W Or Wo Pelvis 2-3 Views Left  Result Date: 09/16/2017 CLINICAL DATA:  42 year old male with left lower extremity pain from the hip to the knee for the past year EXAM: DG HIP (WITH OR WITHOUT PELVIS) 2-3V LEFT  COMPARISON:  Concurrently obtained radiographs of the left knee FINDINGS: There is no evidence of hip fracture or dislocation. There is no evidence of arthropathy or other focal bone abnormality. IMPRESSION: Negative. Electronically Signed   By: Jacqulynn Cadet M.D.   On: 09/16/2017 10:46   Xr Lumbar Spine 2-3 Views  Result Date: 09/19/2017 X-rays of the lumbar spine reveal moderate degenerative disc disease at L5-S1  Xr Shoulder Right  Result Date: 09/29/2017 Enlargement of distal clavicle with erosion worrisome for osteomyelitis  [place] Ekg Site Rite  Result Date:  09/30/2017 If Site Rite image not attached, placement could not be confirmed due to current cardiac rhythm.   Disposition: 01-Home or Self Care    Follow-up Information    Leandrew Koyanagi, MD Follow up in 2 week(s).   Specialty:  Orthopedic Surgery Why:  For suture removal, For wound re-check Contact information: [place] [phone]            Signed: [name] 10/02/2017, 7:50 AM

## 2017-10-02 NOTE — Telephone Encounter (Signed)
Called patient no answer. 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

## 2017-10-02 NOTE — Progress Notes (Addendum)
[facility] for Infectious Disease    Date of Admission:  09/29/2017   Total days of antibiotics 4           ID: [name] is a 42 y.o. male with MSSA Active Problems:   Septic arthritis of right acromioclavicular joint ([place])    Subjective: Afebrile, has some question to how he will be able to bath with picc line and shoulder dressing   cx came back as MSSA  Medications:  . lisinopril  10 mg Oral Daily  . loratadine  10 mg Oral Daily  . mometasone-formoterol  2 puff Inhalation BID  . pantoprazole  40 mg Oral Daily  . triamcinolone  1 spray Nasal Daily    Objective: Vital signs in last 24 hours: Temp:  [97.3 F (36.3 C)-98.5 F (36.9 C)] 98.5 F (36.9 C) (02/25 0517) Pulse Rate:  [88-99] 88 (02/25 0517) Resp:  [16-18] 18 (02/25 0517) BP: (135-155)/(68-81) 155/81 (02/25 0517) SpO2:  [97 %-100 %] 97 % (02/25 0517) Physical Exam  Constitutional: He is oriented to person, place, and time. He appears well-developed and well-nourished. No distress.  HENT:  Mouth/Throat: Oropharynx is clear and moist. No oropharyngeal exudate.  Cardiovascular: Normal rate, regular rhythm and normal heart sounds. Exam reveals no gallop and no friction rub.  No murmur heard.  Pulmonary/Chest: Effort normal and breath sounds normal. No respiratory distress. He has no wheezes.  Ext: right shoulder well bandaged. Left arm picc line is c/d/i Skin: Skin is warm and dry. No rash noted. No erythema.  Psychiatric: He has a normal mood and affect. His behavior is normal.     Lab Results Recent Labs    09/29/17 1143 10/02/17 0338  WBC 10.8*  --   HGB 14.0  --   HCT 42.8  --   NA 136 138  K 4.4 4.1  CL 102 101  CO2 21* 28  BUN 15 10  CREATININE 0.74 0.76   Sedimentation Rate Recent Labs    09/30/17 0536  ESRSEDRATE 75*   C-Reactive Protein Recent Labs    09/29/17 1659 09/30/17 0536  CRP 6.6* 5.4*    Microbiology: 2/22 tissue x 3 MSSA Studies/Results: [place] Ekg Site  Rite  Result Date: 09/30/2017 If Site Rite image not attached, placement could not be confirmed due to current cardiac rhythm.    Assessment/Plan:  Right shoulder Septic Arthritis with MSSA = plan on 6 wk with cefazolin 2gm IV Q 8hr. Will place opat order. From ID standpoint, can go home. Will arrange follow up appt.  Home health orders listed below, I have contacted coordinator, pam chandler ----------------------------- Diagnosis: Septic arthritis  Culture Result: MSSA  No Known Allergies  OPAT Orders Discharge antibiotics: Per pharmacy protocol  Cefazolin 2gm IV q 8hr Aim for Vancomycin trough 15-20 (unless otherwise indicated) Duration: 6 wk End Date: April 6th  [facility] Care Per Protocol:  Labs weekly while on IV antibiotics: x__ CBC with differential _x_ BMP __ CMP x__ CRP __x ESR   _x_ Please pull PIC at completion of IV antibiotics   Fax weekly labs to [phone]  Clinic Follow Up Appt: 4-6 wk  @ [facility]  Spent 25 min with greater than 50% in coordination of care for septic arthritis   [facility] Cell: [phone] Pager: [phone]  10/02/2017, 9:42 AM

## 2017-10-03 ENCOUNTER — Other Ambulatory Visit: Payer: Managed Care, Other (non HMO)

## 2017-10-04 ENCOUNTER — Encounter (INDEPENDENT_AMBULATORY_CARE_PROVIDER_SITE_OTHER): Payer: Self-pay

## 2017-10-04 LAB — AEROBIC/ANAEROBIC CULTURE W GRAM STAIN (SURGICAL/DEEP WOUND)

## 2017-10-05 LAB — ANAEROBIC AND AEROBIC CULTURE
MICRO NUMBER:: 90236450
MICRO NUMBER:: 90236451
SPECIMEN QUALITY:: ADEQUATE
SPECIMEN QUALITY:: ADEQUATE

## 2017-10-09 ENCOUNTER — Ambulatory Visit (INDEPENDENT_AMBULATORY_CARE_PROVIDER_SITE_OTHER): Payer: Managed Care, Other (non HMO) | Admitting: Orthopaedic Surgery

## 2017-10-09 ENCOUNTER — Encounter (INDEPENDENT_AMBULATORY_CARE_PROVIDER_SITE_OTHER): Payer: Self-pay | Admitting: Orthopaedic Surgery

## 2017-10-09 DIAGNOSIS — M009 Pyogenic arthritis, unspecified: Secondary | ICD-10-CM

## 2017-10-09 NOTE — Progress Notes (Signed)
Patient is 10 days status post washout of a septic right AC joint.  He is doing well overall.  He is on IV antibiotics.  He has his PICC line.  His pain is well controlled.  His function is much better at this point.  He has some scant drainage from the incision but there is no reaccumulation of infection or abscess.  At this point we will keep the sutures in for another week.  We will have him come back next week for suture removal.

## 2017-10-12 ENCOUNTER — Inpatient Hospital Stay (INDEPENDENT_AMBULATORY_CARE_PROVIDER_SITE_OTHER): Payer: Managed Care, Other (non HMO) | Admitting: Orthopaedic Surgery

## 2017-10-12 ENCOUNTER — Telehealth (INDEPENDENT_AMBULATORY_CARE_PROVIDER_SITE_OTHER): Payer: Self-pay | Admitting: Orthopaedic Surgery

## 2017-10-12 NOTE — Telephone Encounter (Signed)
Patient called needing Rx refilled Methocarbamol, Hydrocodone. The number to contact patient is [phone]

## 2017-10-12 NOTE — Telephone Encounter (Signed)
Ok to refill robaxin 593m, take 1 tab po q 8 hours prn muscle spasm.  #30, no refills  Norco 5 mg, take 1 tab po q6 hours prn pain.  #30

## 2017-10-12 NOTE — Telephone Encounter (Signed)
See message below °

## 2017-10-13 ENCOUNTER — Other Ambulatory Visit: Payer: Managed Care, Other (non HMO)

## 2017-10-13 ENCOUNTER — Other Ambulatory Visit: Payer: Self-pay | Admitting: Pharmacist

## 2017-10-13 ENCOUNTER — Other Ambulatory Visit (INDEPENDENT_AMBULATORY_CARE_PROVIDER_SITE_OTHER): Payer: Self-pay

## 2017-10-13 MED ORDER — METHOCARBAMOL 500 MG PO TABS: ORAL_TABLET | ORAL | 0 refills | Status: DC

## 2017-10-13 MED ORDER — HYDROCODONE-ACETAMINOPHEN 5-325 MG PO TABS: ORAL_TABLET | ORAL | 0 refills | Status: DC

## 2017-10-13 NOTE — Telephone Encounter (Signed)
Rx ready for pick up no answer [facility]

## 2017-10-15 ENCOUNTER — Ambulatory Visit
Admission: RE | Admit: 2017-10-15 | Discharge: 2017-10-15 | Disposition: A | Discharge status: Home / Self Care | Payer: Managed Care, Other (non HMO) | Source: Ambulatory Visit | Attending: Orthopaedic Surgery | Admitting: Orthopaedic Surgery

## 2017-10-15 DIAGNOSIS — M5442 Lumbago with sciatica, left side: Principal | ICD-10-CM

## 2017-10-15 DIAGNOSIS — G8929 Other chronic pain: Secondary | ICD-10-CM

## 2017-10-16 ENCOUNTER — Ambulatory Visit (INDEPENDENT_AMBULATORY_CARE_PROVIDER_SITE_OTHER): Payer: Managed Care, Other (non HMO) | Admitting: Orthopaedic Surgery

## 2017-10-16 ENCOUNTER — Encounter (INDEPENDENT_AMBULATORY_CARE_PROVIDER_SITE_OTHER): Payer: Self-pay | Admitting: Orthopaedic Surgery

## 2017-10-16 ENCOUNTER — Telehealth (INDEPENDENT_AMBULATORY_CARE_PROVIDER_SITE_OTHER): Payer: Self-pay | Admitting: *Deleted

## 2017-10-16 DIAGNOSIS — M5442 Lumbago with sciatica, left side: Secondary | ICD-10-CM | POA: Diagnosis not present

## 2017-10-16 DIAGNOSIS — M009 Pyogenic arthritis, unspecified: Secondary | ICD-10-CM | POA: Diagnosis not present

## 2017-10-16 DIAGNOSIS — G8929 Other chronic pain: Secondary | ICD-10-CM

## 2017-10-16 MED ORDER — MUPIROCIN 2 % EX OINT: 1.0000 "application " | TOPICAL_OINTMENT | Freq: Two times a day (BID) | CUTANEOUS | 0 refills | Status: DC

## 2017-10-16 MED ORDER — MUPIROCIN 2 % EX OINT: TOPICAL_OINTMENT | CUTANEOUS | 0 refills | Status: DC

## 2017-10-16 NOTE — Progress Notes (Signed)
Post-Op Visit Note   Patient: [name]           Date of Birth: 06-01-1976           MRN: [id number] Visit Date: 10/16/2017 PCP: Tamsen Roers, MD   Assessment & Plan:  Chief Complaint:  Chief Complaint  Patient presents with  . Right Shoulder - Pain, Follow-up   Visit Diagnoses:  1. Septic arthritis of right acromioclavicular joint ([place])   2. Chronic left-sided low back pain with left-sided sciatica     Plan: [name] comes in for follow-up.  17 days status post irrigation and debridement septic AC joint on the right.  He has been doing well.  He does have a PICC line and is being followed by ID.  He has been working on it range of motion exercises.  No fevers chills or any other systemic symptoms.  Supination of his right shoulder reveals an incision with nylon sutures intact.  There is no purulent drainage.  Minimal erythema.  He does have a few blood blisters and some of them have unroofed.  He is rest intact distally.  At this point, we will call in a prescription for mupirocin for him to apply twice daily.  We will also remove the stitches today.  He will follow-up with [place] in 2 weeks time.  He will call with concerns or questions in the meantime.  In regards to the lumbar spine, we saw him for this several weeks ago.  At that time, he was having severe pain with radiculopathy.  An MRI was ordered which showed a possible stress reaction L4 and 5 pedicles.  Also showed T11-12 moderate disc protrusion with left anterior cord impingement.  Also noted L5-S1 disc protrusion impeding on S1 nerve root.  At this point, we will refer him to Dr. Ernestina Patches for an epidural steroid injection.  He will call with concerns or questions in the meantime.    Follow-Up Instructions: Return in about 2 weeks (around 10/30/2017).   Orders:  No orders of the defined types were placed in this encounter.  Meds ordered this encounter  Medications  . mupirocin ointment (BACTROBAN) 2 %    Sig: Apply to  incision twice daily    Dispense:  22 g    Refill:  0  . mupirocin ointment (BACTROBAN) 2 %    Sig: Apply 1 application topically 2 (two) times daily.    Dispense:  22 g    Refill:  0    Imaging: Mr Lumbar Spine W/o Contrast  Result Date: 10/16/2017 CLINICAL DATA:  42 y/o M; lower back pain with left leg pain increasing for 1 year. Suddenly severe 3 weeks ago. History of lumbar disc surgery in 2002. EXAM: MRI LUMBAR SPINE WITHOUT CONTRAST TECHNIQUE: Multiplanar, multisequence MR imaging of the lumbar spine was performed. No intravenous contrast was administered. COMPARISON:  09/19/2017 lumbar spine radiographs. 04/19/2017 CT abdomen and pelvis. FINDINGS: Segmentation:  Standard. Alignment:  Physiologic. Vertebrae: Mild edema within the right L4 and L5 pedicles without fracture, probable mild stress reaction. No loss of vertebral body height. No additional abnormal bone marrow signal. No abnormal disc signal. Conus medullaris and cauda equina: Conus extends to the L1 level. Conus and cauda equina appear normal. Paraspinal and other soft tissues: Negative. Disc levels: T11-12: 8 mm left subarticular disc protrusion with left anterior cord impingement and moderate canal stenosis. Mild left foraminal stenosis. T12-L1: No significant disc displacement, foraminal stenosis, or canal stenosis. L1-2: No significant  disc displacement, foraminal stenosis, or canal stenosis. L2-3: Small disc bulge and mild facet hypertrophy. No significant foraminal or canal stenosis. L3-4: Small disc bulge and mild facet hypertrophy. No significant foraminal or canal stenosis. L4-5: Small disc bulge with mild facet and ligamentum flavum hypertrophy. Mild bilateral foraminal stenosis and mild canal stenosis. L5-S1: Minimal disc bulge, endplate marginal osteophytes, and 5 mm left subarticular/foraminal disc protrusion. The protrusion contacts the descending left S1 nerve root in lateral recess (series 3 image 8 and 9). Mild bilateral  foraminal stenosis. No canal stenosis. IMPRESSION: 1. Mild edema and right L4 and L5 pedicles without fracture, probable mild stress reaction. 2. T11-12 moderate left subarticular disc protrusion with left anterior cord impingement and moderate canal stenosis. 3. L5-S1 left subarticular/foraminal small disc protrusion with contact on descending left S1 nerve root in lateral recess. 4. Multilevel mild foraminal stenosis. No high-grade foraminal stenosis. Electronically Signed   By: Kristine Garbe M.D.   On: 10/16/2017 03:49    PMFS History: Patient Active Problem List   Diagnosis Date Noted  . Fever   . Gram positive bacterial infection   . Septic arthritis of right acromioclavicular joint ([place]) 09/29/2017  . Chronic left-sided low back pain with left-sided sciatica 09/19/2017  . Epigastric pain   . Pancreatitis 04/17/2017  . Essential hypertension 04/17/2017  . Moderate persistent asthma 07/21/2015  . Allergic rhinoconjunctivitis 07/21/2015  . Esophageal reflux 07/21/2015  . ABNORMALITY OF GAIT 11/12/2009  . TARSAL TUNNEL SYNDROME, LEFT 10/08/2009  . PES PLANUS 10/08/2009   Past Medical History:  Diagnosis Date  . Asthma   . HTN (hypertension)   . Pancreatitis     Family History  Problem Relation Age of Onset  . Asthma Brother   . Diabetes Father   . Hypertension Father   . Diabetes Paternal Grandmother   . Hypertension Paternal Grandmother   . Migraines Mother   . GER disease Mother   . Pancreatic cancer Maternal Grandmother   . Heart disease Maternal Grandfather     Past Surgical History:  Procedure Laterality Date  . IRRIGATION AND DEBRIDEMENT SHOULDER Right 09/29/2017   Procedure: IRRIGATION AND DEBRIDEMENT SHOULDER;  Surgeon: Leandrew Koyanagi, MD;  Location: [facility];  Service: Orthopedics;  Laterality: Right;  . LUMBAR [place] SURGERY  2002   Social History   Occupational History  . Occupation: delivery driver  Tobacco Use  . Smoking status: Never Smoker  .  Smokeless tobacco: Never Used  Substance and Sexual Activity  . Alcohol use: Yes    Comment: rarely 2-3 times a year  . Drug use: No  . Sexual activity: Not on file

## 2017-10-16 NOTE — Telephone Encounter (Signed)
Let's hold off on ESI until he's done with abx

## 2017-10-16 NOTE — Telephone Encounter (Signed)
See message below °

## 2017-10-17 NOTE — Telephone Encounter (Signed)
See message below °

## 2017-10-18 NOTE — Telephone Encounter (Signed)
When he is done with abx would you let me know please. Thanks.

## 2017-10-18 NOTE — Telephone Encounter (Signed)
Patient had SU 09/29/17. He states he has been on ABX for 2 1/2 weeks and states they told him he needed to be on it for about 4-6 weeks.

## 2017-10-20 ENCOUNTER — Other Ambulatory Visit: Payer: Self-pay | Admitting: Pharmacist

## 2017-10-25 ENCOUNTER — Other Ambulatory Visit: Payer: Self-pay | Admitting: Pharmacist

## 2017-10-30 ENCOUNTER — Ambulatory Visit: Payer: Managed Care, Other (non HMO) | Admitting: Internal Medicine

## 2017-10-31 ENCOUNTER — Ambulatory Visit (INDEPENDENT_AMBULATORY_CARE_PROVIDER_SITE_OTHER): Payer: Managed Care, Other (non HMO) | Admitting: Orthopaedic Surgery

## 2017-10-31 ENCOUNTER — Encounter (INDEPENDENT_AMBULATORY_CARE_PROVIDER_SITE_OTHER): Payer: Self-pay | Admitting: Orthopaedic Surgery

## 2017-10-31 DIAGNOSIS — M009 Pyogenic arthritis, unspecified: Secondary | ICD-10-CM

## 2017-10-31 MED ORDER — METHOCARBAMOL 500 MG PO TABS: 500.0000 mg | ORAL_TABLET | Freq: Four times a day (QID) | ORAL | 2 refills | Status: DC | PRN

## 2017-10-31 NOTE — Progress Notes (Signed)
Office Visit Note   Patient: [name]           Date of Birth: 1975-12-08           MRN: [id number] Visit Date: 10/31/2017              Requested by: Tamsen Roers, [place], [place] [address] PCP: Tamsen Roers, MD   Assessment & Plan: Visit Diagnoses:  1. Septic arthritis of right acromioclavicular joint ([place])     Plan: Patient has done well from the washout.  He has his follow-up with infectious disease tomorrow.  He still has his PICC line.  Robaxin refilled today.  May return back to work on April 8.  Questions encouraged and answered.  My standpoint he can schedule his epidural steroid injection whenever he wants.  Follow-up as needed.  Follow-Up Instructions: Return if symptoms worsen or fail to improve.   Orders:  No orders of the defined types were placed in this encounter.  Meds ordered this encounter  Medications  . methocarbamol (ROBAXIN) 500 MG tablet    Sig: Take 1 tablet (500 mg total) by mouth every 6 (six) hours as needed for muscle spasms.    Dispense:  30 tablet    Refill:  2      Procedures: No procedures performed   Clinical Data: No additional findings.   Subjective: Chief Complaint  Patient presents with  . Right Shoulder - Routine Post Op, Wound Check, Pain, Follow-up    [name] is 32 days status post washout of his right septic AC joint.  He is also following up for his back pain.  He is yet to get epidural steroid injections.  Denies any changes related to his back.  His right shoulder is feeling significantly better.   Review of Systems   Objective: Vital Signs: There were no vitals taken for this visit.  Physical Exam  Ortho Exam Right shoulder exam shows fully healed surgical scar.  He is no drainage or signs of continued infection.  Good strength and range of motion. Specialty Comments:  No specialty comments available.  Imaging: No results found.   PMFS History: Patient Active Problem List   Diagnosis  Date Noted  . Fever   . Gram positive bacterial infection   . Septic arthritis of right acromioclavicular joint ([place]) 09/29/2017  . Chronic left-sided low back pain with left-sided sciatica 09/19/2017  . Epigastric pain   . Pancreatitis 04/17/2017  . Essential hypertension 04/17/2017  . Moderate persistent asthma 07/21/2015  . Allergic rhinoconjunctivitis 07/21/2015  . Esophageal reflux 07/21/2015  . ABNORMALITY OF GAIT 11/12/2009  . TARSAL TUNNEL SYNDROME, LEFT 10/08/2009  . PES PLANUS 10/08/2009   Past Medical History:  Diagnosis Date  . Asthma   . HTN (hypertension)   . Pancreatitis     Family History  Problem Relation Age of Onset  . Asthma Brother   . Diabetes Father   . Hypertension Father   . Diabetes Paternal Grandmother   . Hypertension Paternal Grandmother   . Migraines Mother   . GER disease Mother   . Pancreatic cancer Maternal Grandmother   . Heart disease Maternal Grandfather     Past Surgical History:  Procedure Laterality Date  . IRRIGATION AND DEBRIDEMENT SHOULDER Right 09/29/2017   Procedure: IRRIGATION AND DEBRIDEMENT SHOULDER;  Surgeon: Leandrew Koyanagi, MD;  Location: [facility];  Service: Orthopedics;  Laterality: Right;  . [place] SURGERY  2002   Social  History   Occupational History  . Occupation: delivery driver  Tobacco Use  . Smoking status: Never Smoker  . Smokeless tobacco: Never Used  Substance and Sexual Activity  . Alcohol use: Yes    Comment: rarely 2-3 times a year  . Drug use: No  . Sexual activity: Not on file

## 2017-11-01 ENCOUNTER — Encounter: Payer: Self-pay | Admitting: Internal Medicine

## 2017-11-01 ENCOUNTER — Ambulatory Visit: Payer: Managed Care, Other (non HMO) | Admitting: Internal Medicine

## 2017-11-01 ENCOUNTER — Telehealth: Payer: Self-pay

## 2017-11-01 VITALS — BP 154/91 | HR 87 | Temp 98.0°F | Wt 292.0 lb

## 2017-11-01 DIAGNOSIS — A4901 Methicillin susceptible Staphylococcus aureus infection, unspecified site: Secondary | ICD-10-CM

## 2017-11-01 DIAGNOSIS — M009 Pyogenic arthritis, unspecified: Secondary | ICD-10-CM

## 2017-11-01 MED ORDER — DOXYCYCLINE HYCLATE 100 MG PO TABS: 100.0000 mg | ORAL_TABLET | Freq: Two times a day (BID) | ORAL | 1 refills | Status: DC

## 2017-11-01 NOTE — Progress Notes (Signed)
RFV: follow up from hospitalization for MSSA R AC joint/septic arthritis  Patient ID: [name], male   DOB: [birth year], 42 y.o.   MRN: [id number]  HPI [name] is a 42yo M with MSSa R AC Joint septic arthritis who underwent debridement on 2/22 by Dr Erlinda Hong. Doing well at his last follow-up. He continues to take cefazolin 2gm IV Q8hr. With end date of April 6th.  This week's inflamm are elevated at sed rate of 54 and crp of 5.7.  Outpatient Encounter Medications as of 11/01/2017  Medication Sig  . Albuterol Sulfate (PROAIR RESPICLICK) 409 (90 Base) MCG/ACT AEPB Inhale 2 puffs into the lungs every 4 (four) hours.  . bisacodyl (DULCOLAX) 5 MG EC tablet Take 1 tablet (5 mg total) by mouth daily as needed for moderate constipation.  . budesonide-formoterol (SYMBICORT) 160-4.5 MCG/ACT inhaler Inhale two puffs twice daily to prevent cough or wheeze. Rinse, gargle, and spit after use.  Marland Kitchen ceFAZolin (ANCEF) IVPB Inject 2 g into the vein every 8 (eight) hours. Indication:  Septic arthritis Last Day of Therapy:  11/11/2017 Labs - Once weekly:  CBC/D and BMP, Labs - Every other week:  ESR and CRP  . cetirizine (ZYRTEC) 10 MG tablet Take 10 mg by mouth daily.  . cyclobenzaprine (FLEXERIL) 10 MG tablet Take 1 tablet (10 mg total) by mouth 2 (two) times daily as needed for muscle spasms.  Marland Kitchen lisinopril (PRINIVIL,ZESTRIL) 10 MG tablet Take 10 mg by mouth daily.  . methocarbamol (ROBAXIN) 500 MG tablet Take 1 tablet (500 mg total) by mouth 4 (four) times daily.  . methocarbamol (ROBAXIN) 500 MG tablet take 1 tab po q 8 hours prn muscle spasm.  . methocarbamol (ROBAXIN) 500 MG tablet Take 1 tablet (500 mg total) by mouth every 6 (six) hours as needed for muscle spasms.  . mupirocin ointment (BACTROBAN) 2 % Apply to incision twice daily  . mupirocin ointment (BACTROBAN) 2 % Apply 1 application topically 2 (two) times daily.  Marland Kitchen omeprazole (PRILOSEC) 20 MG capsule Take 20 mg by mouth 2 (two) times daily before a  meal.   . Triamcinolone Acetonide (NASACORT ALLERGY 24HR NA) Place 1 spray into the nose daily.  . diclofenac (VOLTAREN) 50 MG EC tablet Take 1 tablet (50 mg total) by mouth 2 (two) times daily. (Patient not taking: Reported on 11/01/2017)  . docusate sodium (COLACE) 100 MG capsule Take 1 capsule (100 mg total) by mouth 2 (two) times daily. (Patient not taking: Reported on 11/01/2017)  . HYDROcodone-acetaminophen (NORCO) 5-325 MG tablet Take 1-2 tablets by mouth every 6 (six) hours as needed for moderate pain. (Patient not taking: Reported on 11/01/2017)  . HYDROcodone-acetaminophen (NORCO/VICODIN) 5-325 MG tablet Take 1 tab po q6 hours prn pain (Patient not taking: Reported on 11/01/2017)  . ondansetron (ZOFRAN) 4 MG tablet Take 1 tablet (4 mg total) by mouth every 8 (eight) hours as needed for nausea or vomiting. (Patient not taking: Reported on 11/01/2017)   No facility-administered encounter medications on file as of 11/01/2017.      Patient Active Problem List   Diagnosis Date Noted  . Fever   . Gram positive bacterial infection   . Septic arthritis of right acromioclavicular joint ([place]) 09/29/2017  . Chronic left-sided low back pain with left-sided sciatica 09/19/2017  . Epigastric pain   . Pancreatitis 04/17/2017  . Essential hypertension 04/17/2017  . Moderate persistent asthma 07/21/2015  . Allergic rhinoconjunctivitis 07/21/2015  . Esophageal reflux 07/21/2015  . ABNORMALITY OF GAIT 11/12/2009  .  TARSAL TUNNEL SYNDROME, LEFT 10/08/2009  . PES PLANUS 10/08/2009     Health Maintenance Due  Topic Date Due  . TETANUS/TDAP  12/01/1992  . INFLUENZA VACCINE  03/08/2017     Review of Systems Has back pain, but otherwise 12 point ros is negative Physical Exam   BP (!) 154/91   Pulse 87   Temp 98 F (36.7 C) (Oral)   Wt 292 lb (132.5 kg)   BMI 43.12 kg/m    Physical Exam  Constitutional: He is oriented to person, place, and time. He appears well-developed and well-nourished.  No distress.  HENT:  Mouth/Throat: Oropharynx is clear and moist. No oropharyngeal exudate.  Cardiovascular: Normal rate, regular rhythm and normal heart sounds. Exam reveals no gallop and no friction rub.  No murmur heard.  Pulmonary/Chest: Effort normal and breath sounds normal. No respiratory distress. He has no wheezes. Chest wall = incision is well healed  Ext: left picc line is c/d/i Psychiatric: He has a normal mood and affect. His behavior is normal.    CBC Lab Results  Component Value Date   WBC 10.8 (H) 09/29/2017   RBC 4.99 09/29/2017   HGB 14.0 09/29/2017   HCT 42.8 09/29/2017   PLT 255 09/29/2017   MCV 85.8 09/29/2017   MCH 28.1 09/29/2017   MCHC 32.7 09/29/2017   RDW 13.5 09/29/2017    BMET Lab Results  Component Value Date   NA 138 10/02/2017   K 4.1 10/02/2017   CL 101 10/02/2017   CO2 28 10/02/2017   GLUCOSE 101 (H) 10/02/2017   BUN 10 10/02/2017   CREATININE 0.76 10/02/2017   CALCIUM 8.8 (L) 10/02/2017   GFRNONAA >60 10/02/2017   GFRAA >60 10/02/2017    Lab Results  Component Value Date   ESRSEDRATE 75 (H) 09/30/2017   Lab Results  Component Value Date   CRP 5.4 (H) 09/30/2017    Assessment and Plan  - continue IV Abtx until April 6th, then will do 4 wk of oral abtx since markers still elevated. Will give doxy 143m BID x 4 wk - ok for him to get epidural injections  After he completes iv abtx  Will see back in 5 wk

## 2017-11-01 NOTE — Telephone Encounter (Signed)
Called [facility] with a verbal order from Dr. [name] Flattery. Dr. [name] Flattery was hoping to continue pt's IV antibiotics until April 6 and have Picc pulled on April 7. Spoke with Guerry Minors at [facility] who stated she had the orders already in, but would relay the information to the home health nurse. [place]

## 2017-11-08 NOTE — Telephone Encounter (Signed)
Gave denial papers from insurance to Dr. Erlinda Hong assistant to further review.

## 2017-11-13 ENCOUNTER — Telehealth (INDEPENDENT_AMBULATORY_CARE_PROVIDER_SITE_OTHER): Payer: Self-pay | Admitting: *Deleted

## 2017-11-13 NOTE — Telephone Encounter (Signed)
Yes and we need to do peer to peer

## 2017-11-13 NOTE — Telephone Encounter (Signed)
See note, please advise.

## 2017-11-14 NOTE — Telephone Encounter (Signed)
Not sure how to schedule a peer to peer, spoke with Lynann Bologna she states Mondays & Tuesdays after 11am will be great for Dr. Erlinda Hong.

## 2017-11-14 NOTE — Telephone Encounter (Signed)
What insurance is it?  Did the patient or [place] receive a letter of denial from insurance?  That letter will tell us more about why they denied, etc.  See if you can get that, then I will show you how to set it up.

## 2017-11-14 NOTE — Telephone Encounter (Signed)
° °  Form needed( Requested from pt job) Return to work fitness form   Pt would like to know if we can fill this form out since his insurance denied injection. Pt is looking to return to work. Im not sure if this form will require [employer] not sure what it looks like.

## 2017-11-14 NOTE — Telephone Encounter (Signed)
See messages below.

## 2017-12-08 NOTE — Telephone Encounter (Signed)
Peer to peer is scheduled for 12/12/17 with Dr. Erlinda Hong at 1130am.

## 2017-12-12 ENCOUNTER — Encounter: Payer: Self-pay | Admitting: Internal Medicine

## 2017-12-12 ENCOUNTER — Ambulatory Visit: Payer: Managed Care, Other (non HMO) | Admitting: Internal Medicine

## 2017-12-12 VITALS — BP 166/107 | HR 90 | Temp 98.3°F | Ht 68.5 in | Wt 298.0 lb

## 2017-12-12 DIAGNOSIS — M009 Pyogenic arthritis, unspecified: Secondary | ICD-10-CM

## 2017-12-12 MED ORDER — MUPIROCIN 2 % EX OINT: TOPICAL_OINTMENT | CUTANEOUS | 0 refills | Status: DC

## 2017-12-13 LAB — C-REACTIVE PROTEIN: CRP: 8.3 mg/L — ABNORMAL HIGH (ref <8.0)

## 2017-12-13 LAB — SEDIMENTATION RATE: Sed Rate: 6 mm/h (ref 0–15)

## 2017-12-16 NOTE — Progress Notes (Signed)
RFV:    Patient ID: [name], male   DOB: Jun 10, 1976, 42 y.o.   MRN: [id number]  HPI 42yo M with history of MSSA R AC joint septic arthritis who underwent debridement on 2/22 by Dr Erlinda Hong. Received 6 wks of cefazolin 2gm IV Q8hr which ended April 6th.he states that his shoulder is doing well. No pain. No fevers, chills ,nightsweats. He has notices small clear blister erupted within his cheloid scar   Still having some back pain, did not receive his steroid injection.he is also concerned about numbness to 3rd and 4th digit, being evaluated for carpal tunnel syndrome  Outpatient Encounter Medications as of 12/12/2017  Medication Sig  . budesonide-formoterol (SYMBICORT) 160-4.5 MCG/ACT inhaler Inhale two puffs twice daily to prevent cough or wheeze. Rinse, gargle, and spit after use.  . cetirizine (ZYRTEC) 10 MG tablet Take 10 mg by mouth daily.  Marland Kitchen doxycycline (VIBRA-TABS) 100 MG tablet Take 1 tablet (100 mg total) by mouth 2 (two) times daily. Start on April 7th. Take on full stomach  . lisinopril (PRINIVIL,ZESTRIL) 10 MG tablet Take 10 mg by mouth daily.  Marland Kitchen omeprazole (PRILOSEC) 20 MG capsule Take 20 mg by mouth 2 (two) times daily before a meal.   . Triamcinolone Acetonide (NASACORT ALLERGY 24HR NA) Place 1 spray into the nose daily.  . Albuterol Sulfate (PROAIR RESPICLICK) 409 (90 Base) MCG/ACT AEPB Inhale 2 puffs into the lungs every 4 (four) hours.  . bisacodyl (DULCOLAX) 5 MG EC tablet Take 1 tablet (5 mg total) by mouth daily as needed for moderate constipation. (Patient not taking: Reported on 12/12/2017)  . ceFAZolin (ANCEF) IVPB Inject 2 g into the vein every 8 (eight) hours. Indication:  Septic arthritis Last Day of Therapy:  11/11/2017 Labs - Once weekly:  CBC/D and BMP, Labs - Every other week:  ESR and CRP (Patient not taking: Reported on 12/12/2017)  . cyclobenzaprine (FLEXERIL) 10 MG tablet Take 1 tablet (10 mg total) by mouth 2 (two) times daily as needed for muscle spasms.  .  diclofenac (VOLTAREN) 50 MG EC tablet Take 1 tablet (50 mg total) by mouth 2 (two) times daily. (Patient not taking: Reported on 11/01/2017)  . docusate sodium (COLACE) 100 MG capsule Take 1 capsule (100 mg total) by mouth 2 (two) times daily. (Patient not taking: Reported on 11/01/2017)  . HYDROcodone-acetaminophen (NORCO) 5-325 MG tablet Take 1-2 tablets by mouth every 6 (six) hours as needed for moderate pain. (Patient not taking: Reported on 11/01/2017)  . HYDROcodone-acetaminophen (NORCO/VICODIN) 5-325 MG tablet Take 1 tab po q6 hours prn pain (Patient not taking: Reported on 11/01/2017)  . methocarbamol (ROBAXIN) 500 MG tablet Take 1 tablet (500 mg total) by mouth 4 (four) times daily. (Patient not taking: Reported on 12/12/2017)  . methocarbamol (ROBAXIN) 500 MG tablet take 1 tab po q 8 hours prn muscle spasm. (Patient not taking: Reported on 12/12/2017)  . methocarbamol (ROBAXIN) 500 MG tablet Take 1 tablet (500 mg total) by mouth every 6 (six) hours as needed for muscle spasms. (Patient not taking: Reported on 12/12/2017)  . mupirocin ointment (BACTROBAN) 2 % Apply to incision twice daily  . mupirocin ointment (BACTROBAN) 2 % Apply 1 application topically 2 (two) times daily.  . mupirocin ointment (BACTROBAN) 2 % Apply to affected area 3 times daily  . ondansetron (ZOFRAN) 4 MG tablet Take 1 tablet (4 mg total) by mouth every 8 (eight) hours as needed for nausea or vomiting. (Patient not taking: Reported on 11/01/2017)  No facility-administered encounter medications on file as of 12/12/2017.      Patient Active Problem List   Diagnosis Date Noted  . Fever   . Gram positive bacterial infection   . Septic arthritis of right acromioclavicular joint ([place]) 09/29/2017  . Chronic left-sided low back pain with left-sided sciatica 09/19/2017  . Epigastric pain   . Pancreatitis 04/17/2017  . Essential hypertension 04/17/2017  . Moderate persistent asthma 07/21/2015  . Allergic rhinoconjunctivitis  07/21/2015  . Esophageal reflux 07/21/2015  . ABNORMALITY OF GAIT 11/12/2009  . TARSAL TUNNEL SYNDROME, LEFT 10/08/2009  . PES PLANUS 10/08/2009     Health Maintenance Due  Topic Date Due  . TETANUS/TDAP  12/01/1992     Review of Systems Per hpi, otherwise 12 point ros is negative Physical Exam   BP (!) 166/107   Pulse 90   Temp 98.3 F (36.8 C) (Oral)   Ht 5' 8.5" (1.74 m)   Wt 298 lb (135.2 kg)   BMI 44.65 kg/m   Physical Exam  Constitutional: He is oriented to person, place, and time. He appears well-developed and well-nourished. No distress.  HENT:  Mouth/Throat: Oropharynx is clear and moist. No oropharyngeal exudate.  Skin: Skin is warm and dry. No rash noted. No erythema.  Right shoulder incision well healed small 1 mm cyst/clear vesicle Psychiatric: He has a normal mood and affect. His behavior is normal.    CBC Lab Results  Component Value Date   WBC 10.8 (H) 09/29/2017   RBC 4.99 09/29/2017   HGB 14.0 09/29/2017   HCT 42.8 09/29/2017   PLT 255 09/29/2017   MCV 85.8 09/29/2017   MCH 28.1 09/29/2017   MCHC 32.7 09/29/2017   RDW 13.5 09/29/2017    BMET Lab Results  Component Value Date   NA 138 10/02/2017   K 4.1 10/02/2017   CL 101 10/02/2017   CO2 28 10/02/2017   GLUCOSE 101 (H) 10/02/2017   BUN 10 10/02/2017   CREATININE 0.76 10/02/2017   CALCIUM 8.8 (L) 10/02/2017   GFRNONAA >60 10/02/2017   GFRAA >60 10/02/2017    Lab Results  Component Value Date   ESRSEDRATE 6 12/12/2017     Assessment and Plan  Hx of MSSA joint infection = has been off of abtx for roughly 1 month. Will check sed rate to ensure back to WNL. Labs have been reviewed.  Return to clinic if needed

## 2017-12-18 ENCOUNTER — Other Ambulatory Visit: Payer: Self-pay | Admitting: Allergy and Immunology

## 2018-01-10 ENCOUNTER — Telehealth (INDEPENDENT_AMBULATORY_CARE_PROVIDER_SITE_OTHER): Payer: Self-pay | Admitting: *Deleted

## 2018-01-15 ENCOUNTER — Other Ambulatory Visit (INDEPENDENT_AMBULATORY_CARE_PROVIDER_SITE_OTHER): Payer: Self-pay | Admitting: Physical Medicine and Rehabilitation

## 2018-01-15 MED ORDER — DIAZEPAM 5 MG PO TABS: ORAL_TABLET | ORAL | 0 refills | Status: DC

## 2018-01-15 NOTE — Telephone Encounter (Signed)
Sent!

## 2018-01-24 ENCOUNTER — Other Ambulatory Visit: Payer: Self-pay | Admitting: Allergy and Immunology

## 2018-02-06 ENCOUNTER — Ambulatory Visit: Payer: Managed Care, Other (non HMO) | Admitting: Allergy and Immunology

## 2018-02-06 ENCOUNTER — Encounter: Payer: Self-pay | Admitting: Allergy and Immunology

## 2018-02-06 VITALS — BP 150/90 | HR 92 | Resp 20 | Ht 66.5 in | Wt 394.0 lb

## 2018-02-06 DIAGNOSIS — J4540 Moderate persistent asthma, uncomplicated: Secondary | ICD-10-CM

## 2018-02-06 DIAGNOSIS — J3089 Other allergic rhinitis: Secondary | ICD-10-CM | POA: Diagnosis not present

## 2018-02-06 DIAGNOSIS — K219 Gastro-esophageal reflux disease without esophagitis: Secondary | ICD-10-CM | POA: Diagnosis not present

## 2018-02-06 MED ORDER — BUDESONIDE-FORMOTEROL FUMARATE 160-4.5 MCG/ACT IN AERO: INHALATION_SPRAY | RESPIRATORY_TRACT | 5 refills | Status: DC

## 2018-02-06 MED ORDER — CETIRIZINE HCL 10 MG PO TABS: 10.0000 mg | ORAL_TABLET | Freq: Every day | ORAL | 5 refills | Status: DC

## 2018-02-06 MED ORDER — ALBUTEROL SULFATE 108 (90 BASE) MCG/ACT IN AEPB: 2.0000 | INHALATION_SPRAY | RESPIRATORY_TRACT | 0 refills | Status: DC

## 2018-02-06 MED ORDER — TRIAMCINOLONE ACETONIDE 55 MCG/ACT NA AERO: 2.0000 | INHALATION_SPRAY | Freq: Every day | NASAL | 5 refills | Status: DC

## 2018-02-06 NOTE — Patient Instructions (Addendum)
  1. Continue Symbicort 160 - two inhalations twice a day.    2. Continue OTC Rhinocort / Nasacort one spray each nostril one time per day.    3. Continue over-the-counter omeprazole 20 mg one-2 times a day  4. Zyrtec and pro-air respiclick or similar if needed  5. Return in 6 months or earlier if problem        6. Obtain fall flu vaccine

## 2018-02-06 NOTE — Progress Notes (Signed)
Follow-up Note  Referring Provider: Tamsen Roers, MD Primary Provider: Tamsen Roers, MD Date of Office Visit: 02/06/2018  Subjective:   [name] (DOB: [birth year]) is a 42 y.o. male who returns to the [facility] on 02/06/2018 in re-evaluation of the following:  HPI: [name] returns to this clinic in reevaluation of his asthma and allergic rhinitis and reflux.  I last saw him in his clinic 06 Dec 2016.  During the interval he has not required a systemic steroid to treat an exacerbation of asthma.  He uses Symbicort on a regular basis mostly twice a day but occasionally once a day and while doing so his requirement for short acting bronchodilator is approximately twice a week.  He does not really exercise to any degree.  He had very little problems with his nose and only rarely uses a nasal steroid.  His reflux is under very good control while using omeprazole.  He apparently had developed a septic joint involving his right shoulder earlier this spring requiring prolonged antibiotic administration and an incision and drainage.  Fortunately that event appears to have been completely rectified.  Allergies as of 02/06/2018   No Known Allergies     Medication List      Albuterol Sulfate 108 (90 Base) MCG/ACT Aepb Commonly known as:  PROAIR RESPICLICK Inhale 2 puffs into the lungs every 4 (four) hours.   budesonide-formoterol 160-4.5 MCG/ACT inhaler Commonly known as:  SYMBICORT Inhale two puffs twice daily to prevent cough or wheeze. Rinse, gargle, and spit after use.   cetirizine 10 MG tablet Commonly known as:  ZYRTEC Take 10 mg by mouth daily.   lisinopril 10 MG tablet Commonly known as:  PRINIVIL,ZESTRIL Take 10 mg by mouth daily.   NASACORT ALLERGY 24HR NA Place 1 spray into the nose daily.   omeprazole 20 MG capsule Commonly known as:  PRILOSEC Take 20 mg by mouth 2 (two) times daily before a meal.       Past Medical History:  Diagnosis Date    . Asthma   . HTN (hypertension)   . Pancreatitis     Past Surgical History:  Procedure Laterality Date  . IRRIGATION AND DEBRIDEMENT SHOULDER Right 09/29/2017   Procedure: IRRIGATION AND DEBRIDEMENT SHOULDER;  Surgeon: Leandrew Koyanagi, MD;  Location: [facility];  Service: Orthopedics;  Laterality: Right;  . [place] SURGERY  2002    Review of systems negative except as noted in HPI / PMHx or noted below:  Review of Systems  Constitutional: Negative.   HENT: Negative.   Eyes: Negative.   Respiratory: Negative.   Cardiovascular: Negative.   Gastrointestinal: Negative.   Genitourinary: Negative.   Musculoskeletal: Negative.   Skin: Negative.   Neurological: Negative.   Endo/Heme/Allergies: Negative.   Psychiatric/Behavioral: Negative.      Objective:   Vitals:   02/06/18 1813  BP: (!) 150/90  Pulse: 92  Resp: 20  SpO2: 98%   Height: 5' 6.5" (168.9 cm)  Weight: (!) 394 lb (178.7 kg)   Physical Exam  HENT:  Head: Normocephalic.  Right Ear: Tympanic membrane, external ear and ear canal normal.  Left Ear: Tympanic membrane, external ear and ear canal normal.  Nose: Nose normal. No mucosal edema or rhinorrhea.  Mouth/Throat: Uvula is midline, oropharynx is clear and moist and mucous membranes are normal. No oropharyngeal exudate.  Eyes: Conjunctivae are normal.  Neck: Trachea normal. No tracheal tenderness present. No tracheal deviation present. No thyromegaly present.  Cardiovascular: Normal rate, regular rhythm, S1 normal, S2 normal and normal heart sounds.  No murmur heard. Pulmonary/Chest: Breath sounds normal. No stridor. No respiratory distress. He has no wheezes. He has no rales.  Musculoskeletal: He exhibits no edema.  Lymphadenopathy:       Head (right side): No tonsillar adenopathy present.       Head (left side): No tonsillar adenopathy present.    He has no cervical adenopathy.  Neurological: He is alert.  Skin: No rash noted. He is not diaphoretic. No  erythema. Nails show no clubbing.    Diagnostics:    Spirometry was performed and demonstrated an FEV1 of 4.01 at 108 % of predicted.  The patient had an Asthma Control Test with the following results: ACT Total Score: 20.    Assessment and Plan:   1. Asthma, moderate persistent, well-controlled   2. Other allergic rhinitis   3. Gastroesophageal reflux disease, esophagitis presence not specified     1. Continue Symbicort 160 - two inhalations twice a day.    2. Continue OTC Rhinocort / Nasacort one spray each nostril one time per day.    3. Continue over-the-counter omeprazole 20 mg one-2 times a day  4. Zyrtec and pro-air respiclick or similar if needed  5. Return in 6 months or earlier if problem        6. Obtain fall flu vaccine                                                                                                                                                                                                      Arun has done very well with his atopic respiratory disease while consistently using anti-inflammatory agents for both his upper and lower airway and his reflux also appears to be under very good control with omeprazole.  I will now see him back in his clinic in approximately 6 months or earlier if there is a problem.  [name], MD Allergy / Immunology [place]

## 2018-02-07 ENCOUNTER — Encounter: Payer: Self-pay | Admitting: Allergy and Immunology

## 2018-02-12 ENCOUNTER — Ambulatory Visit (INDEPENDENT_AMBULATORY_CARE_PROVIDER_SITE_OTHER): Payer: Managed Care, Other (non HMO) | Admitting: Physical Medicine and Rehabilitation

## 2018-02-12 ENCOUNTER — Encounter (INDEPENDENT_AMBULATORY_CARE_PROVIDER_SITE_OTHER): Payer: Self-pay | Admitting: Physical Medicine and Rehabilitation

## 2018-02-12 ENCOUNTER — Ambulatory Visit (INDEPENDENT_AMBULATORY_CARE_PROVIDER_SITE_OTHER): Payer: Self-pay

## 2018-02-12 VITALS — BP 161/92 | HR 83

## 2018-02-12 DIAGNOSIS — M5116 Intervertebral disc disorders with radiculopathy, lumbar region: Secondary | ICD-10-CM

## 2018-02-12 DIAGNOSIS — G8929 Other chronic pain: Secondary | ICD-10-CM | POA: Diagnosis not present

## 2018-02-12 DIAGNOSIS — M5442 Lumbago with sciatica, left side: Secondary | ICD-10-CM

## 2018-02-12 DIAGNOSIS — R269 Unspecified abnormalities of gait and mobility: Secondary | ICD-10-CM

## 2018-02-12 DIAGNOSIS — M5416 Radiculopathy, lumbar region: Secondary | ICD-10-CM

## 2018-02-12 MED ORDER — BETAMETHASONE SOD PHOS & ACET 6 (3-3) MG/ML IJ SUSP
12.0000 mg | Freq: Once | INTRAMUSCULAR | Status: AC
Start: 2018-02-12 — End: 2018-02-12
  Administered 2018-02-12: 12 mg

## 2018-02-12 NOTE — Progress Notes (Signed)
 .  Numeric Pain Rating Scale and Functional Assessment Average Pain 8   In the last MONTH (on 0-10 scale) has pain interfered with the following?  1. General activity like being  able to carry out your everyday physical activities such as walking, climbing stairs, carrying groceries, or moving a chair?  Rating(5)   +Driver, -BT, -Dye Allergies.

## 2018-02-12 NOTE — Patient Instructions (Signed)
[facility] Discharge Instructions  *At any time if you have questions or concerns they can be answered by calling [phone]  All Patients: . You may experience an increase in your symptoms for the first 2 days (it can take 2 days to 2 weeks for the steroid/cortisone to have its maximal effect). . You may use ice to the site for the first 24 hours; 20 minutes on and 20 minutes off and may use heat after that time. . You may resume and continue your current pain medications. If you need a refill please contact the prescribing physician. . You may resume her regular medications if any were stopped for the procedure. . You may shower but no swimming, tub bath or Jacuzzi for 24 hours. . Please remove bandage after 4 hours. . You may resume light activities as tolerated. . If you had Spine Injection, you should not drive for the next 3 hours due to anesthetics used in the procedure. Please have someone drive for you.  *If you have had sedation, Valium, Xanax, or lorazepam: Do not drive or use public transportation for 24 hours, do not operating hazardous machinery or make important personal/business decisions for 24 hours.  POSSIBLE STEROID SIDE EFFECTS: If experienced these should only last for a short period. Change in menstrual flow  Edema in (swelling)  Increased appetite Skin flushing (redness)  Skin rash/acne  Thrush (oral) Vaginitis    Increased sweating  Depression Increased blood glucose levels Cramping and leg/calf  Euphoria (feeling happy)  POSSIBLE PROCEDURE SIDE EFFECTS: Please call our office if concerned. Increased pain Increased numbness/tingling  Headache Nausea/vomiting Hematoma (bruising/bleeding) Edema (swelling at the site) Weakness  Infection (red/drainage at site) Fever greater than 100.5F  *In the event of a headache after epidural steroid injection: Drink plenty of fluids, especially water and try to lay flat when possible. If the headache does  not get better after a few days or as always if concerned please call the office.  

## 2018-02-13 NOTE — Procedures (Signed)
S1 Lumbosacral Transforaminal Epidural Steroid Injection - Sub-Pedicular Approach with Fluoroscopic Guidance   Patient: [name]      Date of Birth: 1976-05-21 MRN: [id number] PCP: Tamsen Roers, MD      Visit Date: 02/12/2018   Universal Protocol:    Date/Time: 07/09/196:41 AM  Consent Given By: the patient  Position:  PRONE  Additional Comments: Vital signs were monitored before and after the procedure. Patient was prepped and draped in the usual sterile fashion. The correct patient, procedure, and site was verified.   Injection Procedure Details:  Procedure Site One Meds Administered:  Meds ordered this encounter  Medications  . betamethasone acetate-betamethasone sodium phosphate (CELESTONE) injection 12 mg    Laterality: Left  Location/Site:  S1 Foramen   Needle size: 22 ga.  Needle type: Spinal  Needle Placement: Transforaminal  Findings:   -Comments: Excellent flow of contrast along the nerve and into the epidural space.  Procedure Details: After squaring off the sacral end-plate to get a true AP view, the C-arm was positioned so that the best possible view of the S1 foramen was visualized. The soft tissues overlying this structure were infiltrated with 2-3 ml. of 1% Lidocaine without Epinephrine.    The spinal needle was inserted toward the target using a "trajectory" view along the fluoroscope beam.  Under AP and lateral visualization, the needle was advanced so it did not puncture dura. Biplanar projections were used to confirm position. Aspiration was confirmed to be negative for CSF and/or blood. A 1-2 ml. volume of Isovue-250 was injected and flow of contrast was noted at each level. Radiographs were obtained for documentation purposes.   After attaining the desired flow of contrast documented above, a 0.5 to 1.0 ml test dose of 0.25% Marcaine was injected into each respective transforaminal space.  The patient was observed for 90 seconds post  injection.  After no sensory deficits were reported, and normal lower extremity motor function was noted,   the above injectate was administered so that equal amounts of the injectate were placed at each foramen (level) into the transforaminal epidural space.   Additional Comments:  The patient tolerated the procedure well Dressing: Band-Aid    Post-procedure details: Patient was observed during the procedure. Post-procedure instructions were reviewed.  Patient left the clinic in stable condition.

## 2018-02-13 NOTE — Progress Notes (Signed)
[name] - 42 y.o. male MRN [id number]  Date of birth: 1975/10/15  Office Visit Note: Visit Date: 02/12/2018 PCP: [name] Roers, MD Referred by: [name] Roers, MD  Subjective: Chief Complaint  Patient presents with  . Lower Back - Pain  . Right Leg - Pain  . Left Leg - Pain   HPI: [name] is a pleasant 42 year old gentleman who comes in today at the request of [name] in our office for evaluation management of low back and left radicular leg pain.  He reports progressive worsening low back and left buttock pain that started sometime in the fall of last year without specific incident but then in February of this year was leaving work and tripped and since that time has had pain down the leg with significant pain to the left lower back and buttock radiating down the posterior aspect of the left leg.  He describes his pain is constant sharp shooting pains worse with prolonged sitting but also standing and walking.  Pain is somewhat alleviated standing up.  He does note tingling to all toes on the left side and symptoms more in a classic S1 distribution but possibly L5.  No bowel or bladder incontinence, no saddle paresthesias.  He was seen in the ED in February where x-rays of his knee and hip were obtained.  These were negative for fracture or other acute findings.  He was initially treated with prednisone Dosepak and muscle relaxer and some medication which did not really seem to help.  He did have prior laminectomy discectomy in 2002 by [name]  [name] ultimately obtain MRI of the lumbar spine because of failure with conservative care and ongoing severe radiculopathy radicular pain with paresthesia.  He has had no focal weakness.  His case was complicated with septic AC joint arthritis and this occurred throughout the month of March and April.  He is now over that episode but with continued low back and leg pain.  He reports again pain across the lower back bilaterally but the  left buttock hip and leg pain is the worst.  Rates his pain 8 out of 10.  He has no prior history of epidural injection.   Review of Systems  Constitutional: Negative for chills, fever, malaise/fatigue and weight loss.  HENT: Negative for hearing loss and sinus pain.   Eyes: Negative for blurred vision, double vision and photophobia.  Respiratory: Negative for cough and shortness of breath.   Cardiovascular: Negative for chest pain, palpitations and leg swelling.  Gastrointestinal: Negative for abdominal pain, nausea and vomiting.  Genitourinary: Negative for flank pain.  Musculoskeletal: Positive for back pain and joint pain. Negative for myalgias.       Left radicular leg pain  Skin: Negative for itching and rash.  Neurological: Positive for tingling. Negative for tremors, focal weakness and weakness.  Endo/Heme/Allergies: Negative.   Psychiatric/Behavioral: Negative for depression.  All other systems reviewed and are negative.  Otherwise per HPI.  Assessment & Plan: Visit Diagnoses:  1. Lumbar radiculopathy   2. Radiculopathy due to lumbar intervertebral disc disorder   3. Chronic bilateral low back pain with left-sided sciatica   4. Abnormality of gait     Plan: Findings:  History exam and imaging consistent with left S1 radiculitis radiculopathy from left disc protrusion at L5-S1.  MRI shows this to be subarticular and foraminal but his symptoms seem to be more consistent with S1 but could be partially L5 symptoms.  He is  getting some pain across the lower back which I feel like is more mechanical back pain and may be related to the fact that he has had all these issues recently with his joints and he also reports bone spurs in the feet engine general he is overweight.  We discussed the natural course of disc protrusions and radicular pain.  We discussed reasons for surgery and injections.  The patient had opportunity to discuss questions and concerns.  His exam was fairly nonfocal  with good strength.  We talked about weight loss and core strengthening.  We are going to complete a diagnostic and hopefully therapeutic S1 transforaminal injection today.  Depending on relief would possibly repeat this depending on how much relief he had and what signal would allow [place] to do.  He could regroup with physical therapy as well.  Could look at maximizing adjunctive medication treatment.  Hopefully this will follow the course of normal disc protrusions and will resolve on its own at some point.    Meds & Orders:  Meds ordered this encounter  Medications  . betamethasone acetate-betamethasone sodium phosphate (CELESTONE) injection 12 mg    Orders Placed This Encounter  Procedures  . XR C-ARM NO REPORT  . Epidural Steroid injection    Follow-up: Return if symptoms worsen or fail to improve.   Procedures: No procedures performed  S1 Lumbosacral Transforaminal Epidural Steroid Injection - Sub-Pedicular Approach with Fluoroscopic Guidance   Patient: [name]      Date of Birth: March 22, 1976 MRN: [id number] PCP: [name] Roers, MD      Visit Date: 02/12/2018   Universal Protocol:    Date/Time: 07/09/196:41 AM  Consent Given By: the patient  Position:  PRONE  Additional Comments: Vital signs were monitored before and after the procedure. Patient was prepped and draped in the usual sterile fashion. The correct patient, procedure, and site was verified.   Injection Procedure Details:  Procedure Site One Meds Administered:  Meds ordered this encounter  Medications  . betamethasone acetate-betamethasone sodium phosphate (CELESTONE) injection 12 mg    Laterality: Left  Location/Site:  S1 Foramen   Needle size: 22 ga.  Needle type: Spinal  Needle Placement: Transforaminal  Findings:   -Comments: Excellent flow of contrast along the nerve and into the epidural space.  Procedure Details: After squaring off the sacral end-plate to get a true AP view, the  C-arm was positioned so that the best possible view of the S1 foramen was visualized. The soft tissues overlying this structure were infiltrated with 2-3 ml. of 1% Lidocaine without Epinephrine.    The spinal needle was inserted toward the target using a "trajectory" view along the fluoroscope beam.  Under AP and lateral visualization, the needle was advanced so it did not puncture dura. Biplanar projections were used to confirm position. Aspiration was confirmed to be negative for CSF and/or blood. A 1-2 ml. volume of Isovue-250 was injected and flow of contrast was noted at each level. Radiographs were obtained for documentation purposes.   After attaining the desired flow of contrast documented above, a 0.5 to 1.0 ml test dose of 0.25% Marcaine was injected into each respective transforaminal space.  The patient was observed for 90 seconds post injection.  After no sensory deficits were reported, and normal lower extremity motor function was noted,   the above injectate was administered so that equal amounts of the injectate were placed at each foramen (level) into the transforaminal epidural space.   Additional Comments:  The patient tolerated the procedure well Dressing: Band-Aid    Post-procedure details: Patient was observed during the procedure. Post-procedure instructions were reviewed.  Patient left the clinic in stable condition.    Clinical History: MRI LUMBAR SPINE WITHOUT CONTRAST  TECHNIQUE: Multiplanar, multisequence MR imaging of the lumbar spine was performed. No intravenous contrast was administered.  COMPARISON:  09/19/2017 lumbar spine radiographs. 04/19/2017 CT abdomen and pelvis.  FINDINGS: Segmentation:  Standard.  Alignment:  Physiologic.  Vertebrae: Mild edema within the right L4 and L5 pedicles without fracture, probable mild stress reaction. No loss of vertebral body height. No additional abnormal bone marrow signal. No abnormal  disc signal.  Conus medullaris and cauda equina: Conus extends to the L1 level. Conus and cauda equina appear normal.  Paraspinal and other soft tissues: Negative.  Disc levels:  T11-12: 8 mm left subarticular disc protrusion with left anterior cord impingement and moderate canal stenosis. Mild left foraminal stenosis.  T12-L1: No significant disc displacement, foraminal stenosis, or canal stenosis.  L1-2: No significant disc displacement, foraminal stenosis, or canal stenosis.  L2-3: Small disc bulge and mild facet hypertrophy. No significant foraminal or canal stenosis.  L3-4: Small disc bulge and mild facet hypertrophy. No significant foraminal or canal stenosis.  L4-5: Small disc bulge with mild facet and ligamentum flavum hypertrophy. Mild bilateral foraminal stenosis and mild canal stenosis.  L5-S1: Minimal disc bulge, endplate marginal osteophytes, and 5 mm left subarticular/foraminal disc protrusion. The protrusion contacts the descending left S1 nerve root in lateral recess (series 3 image 8 and 9). Mild bilateral foraminal stenosis. No canal stenosis.  IMPRESSION: 1. Mild edema and right L4 and L5 pedicles without fracture, probable mild stress reaction. 2. T11-12 moderate left subarticular disc protrusion with left anterior cord impingement and moderate canal stenosis. 3. L5-S1 left subarticular/foraminal small disc protrusion with contact on descending left S1 nerve root in lateral recess. 4. Multilevel mild foraminal stenosis. No high-grade foraminal stenosis.   Electronically Signed   By: Kristine Garbe M.D.   On: 10/16/2017 03:49   He reports that he has never smoked. He has never used smokeless tobacco.  Recent Labs    09/30/17 0536  HGBA1C 5.7*    Objective:  VS:  HT:    WT:   BMI:     BP:(!) 161/92  HR:83bpm  TEMP: ( )  RESP:  Physical Exam  Constitutional: He is oriented to person, place, and time. He appears  well-developed and well-nourished. No distress.  Morbidly obese  HENT:  Head: Normocephalic and atraumatic.  Eyes: Pupils are equal, round, and reactive to light. Conjunctivae are normal.  Neck: Normal range of motion. Neck supple.  Cardiovascular: Regular rhythm and intact distal pulses.  Pulmonary/Chest: Effort normal. No respiratory distress.  Musculoskeletal:  Patient ambulates without aid with a somewhat antalgic gait to the left.  He has good distal strength without clonus.  He has a positive slump test on the left.  Neurological: He is alert and oriented to person, place, and time. He exhibits normal muscle tone. Coordination normal.  Skin: Skin is warm and dry. No rash noted. No erythema.  Psychiatric: He has a normal mood and affect.  Nursing note and vitals reviewed.   Ortho Exam Imaging: Xr C-arm No Report  Result Date: 02/12/2018 Please see Notes tab for imaging impression.   Past Medical/Family/Surgical/Social History: Medications & Allergies reviewed per EMR, new medications updated. Patient Active Problem List   Diagnosis Date Noted  . Fever   .  Gram positive bacterial infection   . Septic arthritis of right acromioclavicular joint ([place]) 09/29/2017  . Chronic left-sided low back pain with left-sided sciatica 09/19/2017  . Epigastric pain   . Pancreatitis 04/17/2017  . Essential hypertension 04/17/2017  . Moderate persistent asthma 07/21/2015  . Allergic rhinoconjunctivitis 07/21/2015  . Esophageal reflux 07/21/2015  . ABNORMALITY OF GAIT 11/12/2009  . TARSAL TUNNEL SYNDROME, LEFT 10/08/2009  . PES PLANUS 10/08/2009   Past Medical History:  Diagnosis Date  . Asthma   . HTN (hypertension)   . Pancreatitis    Family History  Problem Relation Age of Onset  . Asthma Brother   . Diabetes Father   . Hypertension Father   . Diabetes Paternal Grandmother   . Hypertension Paternal Grandmother   . Migraines Mother   . GER disease Mother   . Pancreatic cancer  Maternal Grandmother   . Heart disease Maternal Grandfather    Past Surgical History:  Procedure Laterality Date  . IRRIGATION AND DEBRIDEMENT SHOULDER Right 09/29/2017   Procedure: IRRIGATION AND DEBRIDEMENT SHOULDER;  Surgeon: Leandrew Koyanagi, MD;  Location: [facility];  Service: Orthopedics;  Laterality: Right;  . LUMBAR [place] SURGERY  2002   Social History   Occupational History  . Occupation: delivery driver  Tobacco Use  . Smoking status: Never Smoker  . Smokeless tobacco: Never Used  Substance and Sexual Activity  . Alcohol use: Yes    Comment: rarely 2-3 times a year  . Drug use: No  . Sexual activity: Not on file

## 2018-02-21 ENCOUNTER — Ambulatory Visit (INDEPENDENT_AMBULATORY_CARE_PROVIDER_SITE_OTHER): Payer: Managed Care, Other (non HMO)

## 2018-02-21 ENCOUNTER — Other Ambulatory Visit: Payer: Self-pay | Admitting: Podiatry

## 2018-02-21 ENCOUNTER — Ambulatory Visit: Payer: Managed Care, Other (non HMO) | Admitting: Podiatry

## 2018-02-21 ENCOUNTER — Encounter: Payer: Self-pay | Admitting: Podiatry

## 2018-02-21 DIAGNOSIS — M722 Plantar fascial fibromatosis: Secondary | ICD-10-CM | POA: Diagnosis not present

## 2018-02-21 DIAGNOSIS — M79671 Pain in right foot: Secondary | ICD-10-CM

## 2018-02-21 DIAGNOSIS — M7751 Other enthesopathy of right foot: Secondary | ICD-10-CM

## 2018-02-21 DIAGNOSIS — M779 Enthesopathy, unspecified: Secondary | ICD-10-CM

## 2018-02-21 MED ORDER — TRIAMCINOLONE ACETONIDE 10 MG/ML IJ SUSP
10.0000 mg | Freq: Once | INTRAMUSCULAR | Status: AC
  Administered 2018-02-21: 10 mg

## 2018-02-22 NOTE — Progress Notes (Signed)
Subjective:   Patient ID: [name], male   DOB: 42 y.o.   MRN: [id number]   HPI Patient presents with pain now that has gone to a second place with quite a bit of discomfort in the lateral side of the right foot and also medial side of the foot with inflammation fluid buildup   ROS      Objective:  Physical Exam  Tendinitis of the lateral side of foot peroneal insertion along with posterior tibial tendinitis right mild intensity     Assessment:  Tendinitis with inflammation of the terminal insertion right the posterior tib right     Plan:  Careful sheath injection administered right 3 mg Kenalog and Xylocaine in the sheath of the right posterior tib 3 mg Kenalog 5 mm Xylocaine with both being tolerated well

## 2018-03-21 ENCOUNTER — Ambulatory Visit: Payer: Managed Care, Other (non HMO) | Admitting: Podiatry

## 2018-03-30 IMAGING — CR DG FOOT COMPLETE 3+V*L*
3 series · 3 of 3 positions shown · non-contrast
Comparison: 09/17/2009.

CLINICAL DATA: 40-year-old male with pain between the first and
second toes along the plantar surface for several months, but
increased lately after activity. Initial encounter.

EXAM:
LEFT FOOT - COMPLETE 3+ VIEW

[x foot ap left]
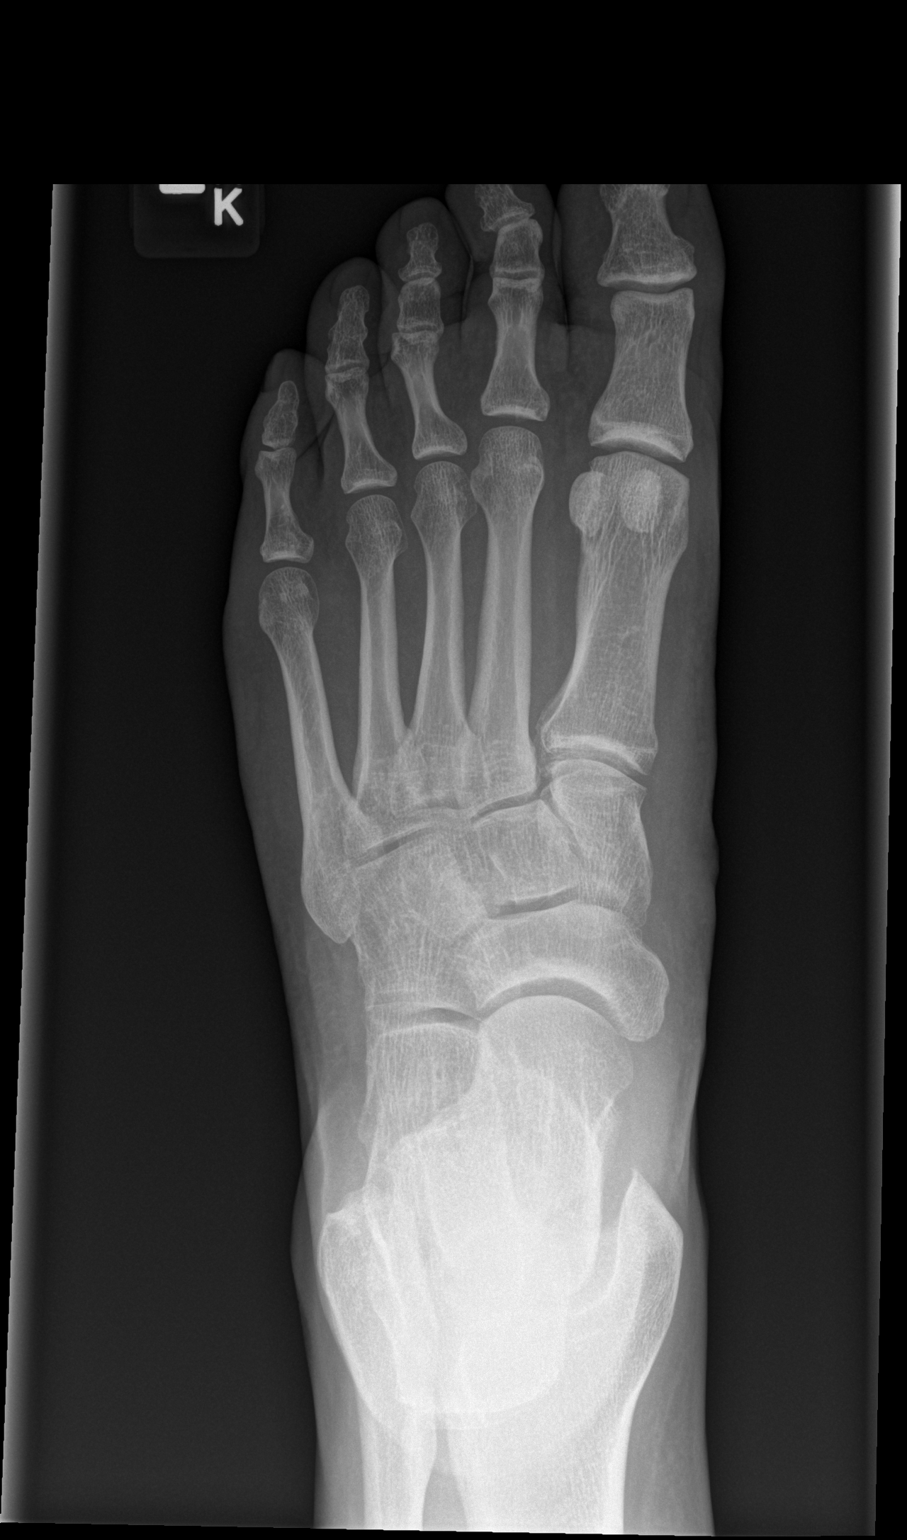

[x foot obl left]
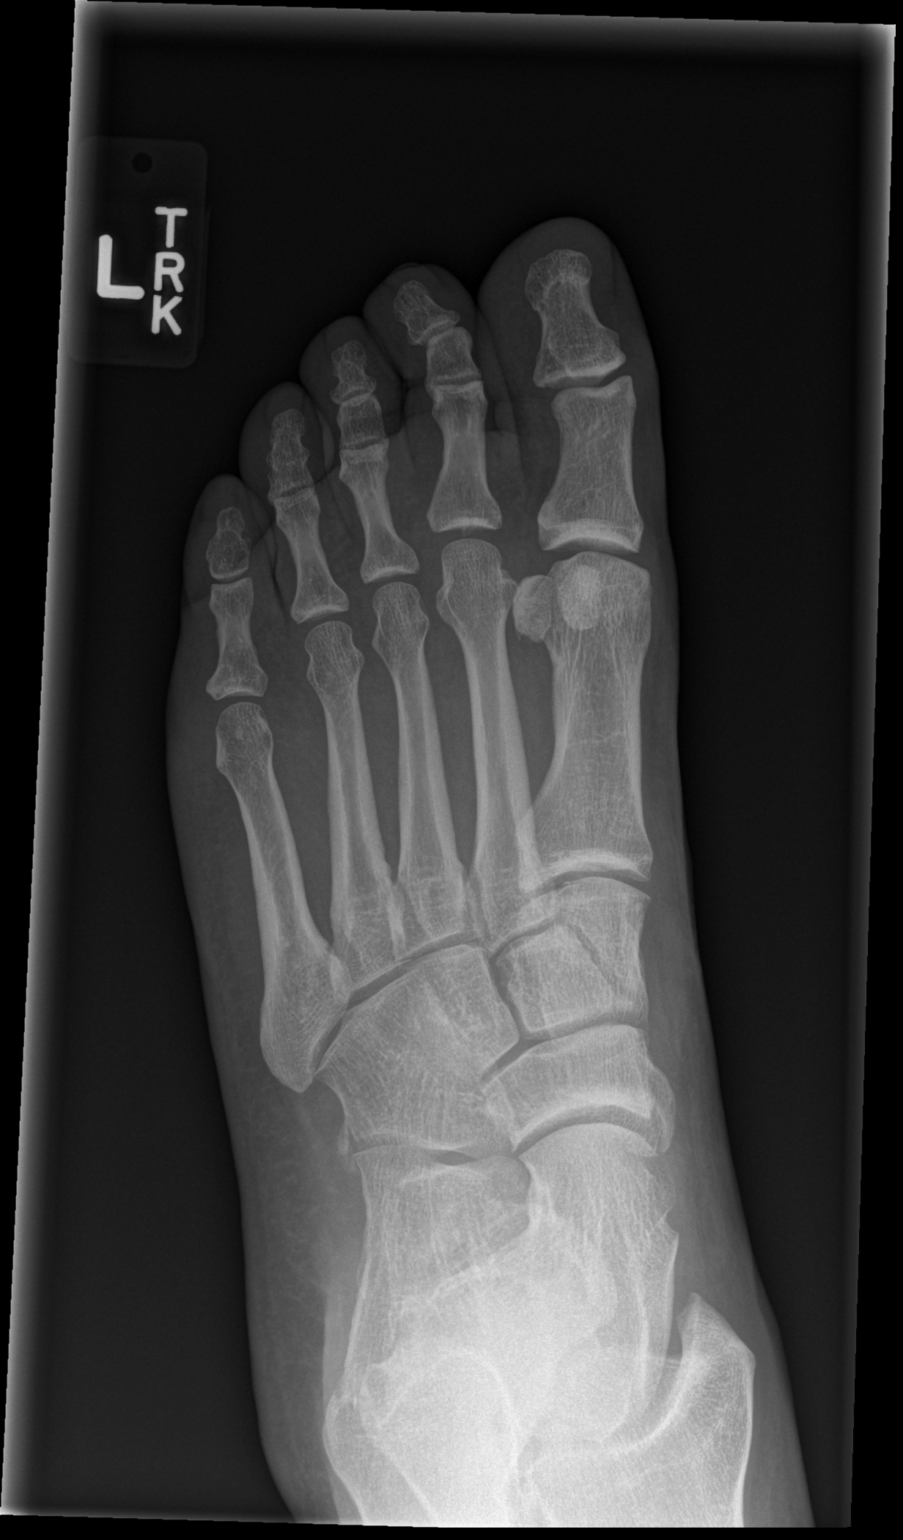

[x foot lat left]
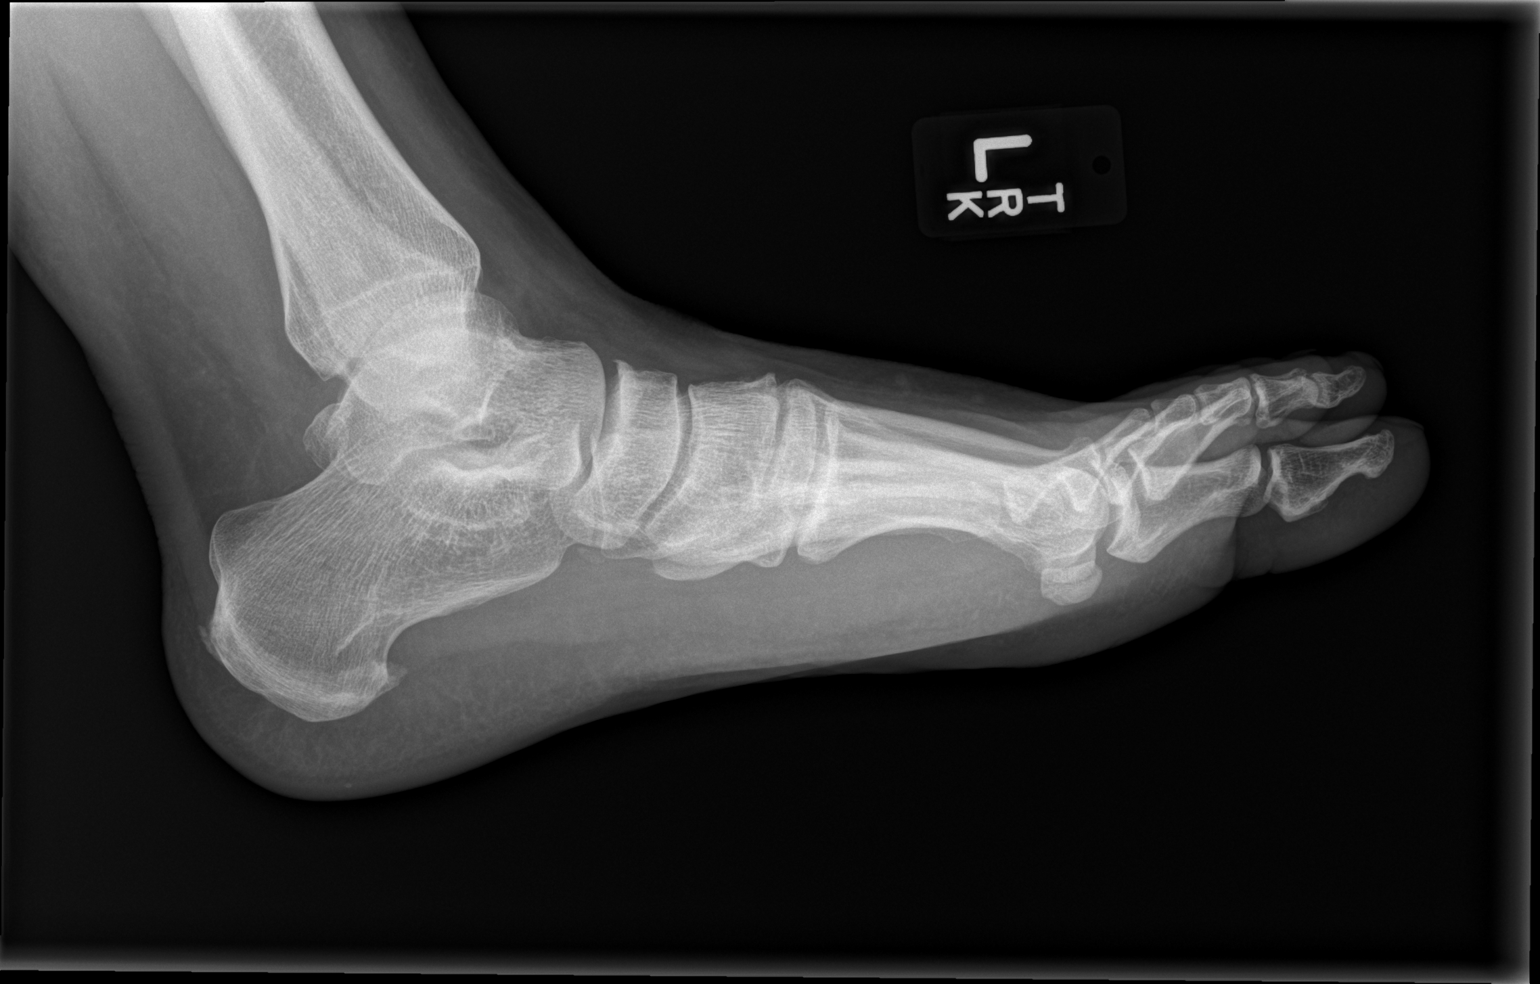

[3 of 3 positions shown; findings below may reference images not displayed]

FINDINGS: Bone mineralization remains normal. There is mild chronic
degenerative spurring along the dorsal tarsal bones which is stable
since 6911. Degenerative spurring at the calcaneus has mildly
increased. Metatarsal and phalangeal joint spaces and alignment are
stable. No acute osseous abnormality identified.
IMPRESSION: Mild left midfoot and hindfoot degeneration with no acute osseous
abnormality identified.

## 2018-06-15 ENCOUNTER — Ambulatory Visit (INDEPENDENT_AMBULATORY_CARE_PROVIDER_SITE_OTHER): Payer: Managed Care, Other (non HMO) | Admitting: Physician Assistant

## 2018-06-15 ENCOUNTER — Encounter: Payer: Self-pay | Admitting: Physician Assistant

## 2018-06-15 ENCOUNTER — Other Ambulatory Visit (INDEPENDENT_AMBULATORY_CARE_PROVIDER_SITE_OTHER): Payer: Managed Care, Other (non HMO)

## 2018-06-15 VITALS — BP 138/82 | HR 79 | Resp 16 | Ht 68.5 in | Wt 298.2 lb

## 2018-06-15 DIAGNOSIS — K219 Gastro-esophageal reflux disease without esophagitis: Secondary | ICD-10-CM

## 2018-06-15 DIAGNOSIS — R1013 Epigastric pain: Secondary | ICD-10-CM | POA: Diagnosis not present

## 2018-06-15 DIAGNOSIS — R748 Abnormal levels of other serum enzymes: Secondary | ICD-10-CM | POA: Diagnosis not present

## 2018-06-15 LAB — COMPREHENSIVE METABOLIC PANEL
ALT: 22 U/L (ref 0–53)
AST: 16 U/L (ref 0–37)
Albumin: 4.3 g/dL (ref 3.5–5.2)
Alkaline Phosphatase: 72 U/L (ref 39–117)
BUN: 19 mg/dL (ref 6–23)
CO2: 31 mEq/L (ref 19–32)
Calcium: 9.4 mg/dL (ref 8.4–10.5)
Chloride: 106 mEq/L (ref 96–112)
Creatinine, Ser: 0.96 mg/dL (ref 0.40–1.50)
GFR: 90.22 mL/min (ref >60.00)
Glucose, Bld: 96 mg/dL (ref 70–99)
Potassium: 4.6 mEq/L (ref 3.5–5.1)
Sodium: 142 mEq/L (ref 135–145)
Total Bilirubin: 0.3 mg/dL (ref 0.2–1.2)
Total Protein: 7.4 g/dL (ref 6.0–8.3)

## 2018-06-15 LAB — LIPASE: Lipase: 37 U/L (ref 11.0–59.0)

## 2018-06-15 LAB — TRIGLYCERIDES: Triglycerides: 169 mg/dL — ABNORMAL HIGH (ref 0.0–149.0)

## 2018-06-15 MED ORDER — OMEPRAZOLE 40 MG PO CPDR: 40.0000 mg | DELAYED_RELEASE_CAPSULE | Freq: Every day | ORAL | 5 refills | Status: DC

## 2018-06-15 NOTE — Patient Instructions (Addendum)
If you are age 44 or older, your body mass index should be between 23-30. Your Body mass index is 44.68 kg/m. If this is out of the aforementioned range listed, please consider follow up with your Primary Care Provider.  If you are age 48 or younger, your body mass index should be between 19-25. Your Body mass index is 44.68 kg/m. If this is out of the aformentioned range listed, please consider follow up with your Primary Care Provider.   Your provider has requested that you go to the basement level for lab work before leaving today. Press "B" on the elevator. The lab is located at the first door on the left as you exit the elevator.  We have sent the following medications to your pharmacy for you to pick up at your convenience: Omeprazole 40 mg  We will call you with results.  Follow up as needed.  Thank you for choosing me and [facility].   Ellouise Newer, PA-C

## 2018-06-15 NOTE — Progress Notes (Signed)
Chief Complaint: Reflux, Epigastric pain  HPI:    [name] is a 42 year old male with a past medical history as listed below, known to Dr. Ardis Hughs, who was referred to me by Tamsen Roers, MD for a complaint of reflux/epigastric pain.      05/09/2017 last office visit with me for follow-up after acute uncomplicated pancreatitis 04/17/2017-04/21/2017.  At that time patient had no known precipitating factors other than ultrasound with biliary sludge.  It was recommended he have no further testing at that time and encouraged him to maintain abstinence from alcohol and maintain healthy diet.    06/08/2018 patient seen at [facility] urgent care for epigastric pain.  At that time had a CBC, CMP and GGT which were all normal.  He did have a lipase drawn which was elevated at 122.    Today, patient explains that about a week and a half ago he went on a date night with his wife and had an alcoholic beverage and was also eating very poorly, he developed an epigastric discomfort about 24 hours later which radiated through to his back.  He describes this was a 10 /10 at its worst and when he would eat it made it even worse.  This continued for 4 to 5 days but by the time of last weekend it was better and he has had no more problems over the past 4 days.  Does describe intermittent trouble with reflux and epigastric pain "here and there" for which he chews Tums and it makes it better.  Is consistently uses omeprazole 20 mg once daily, occasionally uses this in the morning and at night.    Denies fever, chills, weight loss, anorexia, nausea, vomiting or change in bowel habits.  Past Medical History:  Diagnosis Date  . Asthma   . HTN (hypertension)   . Pancreatitis     Past Surgical History:  Procedure Laterality Date  . IRRIGATION AND DEBRIDEMENT SHOULDER Right 09/29/2017   Procedure: IRRIGATION AND DEBRIDEMENT SHOULDER;  Surgeon: Leandrew Koyanagi, MD;  Location: [facility];  Service: Orthopedics;  Laterality:  Right;  . LUMBAR [place] SURGERY  2002    Current Outpatient Medications  Medication Sig Dispense Refill  . Albuterol Sulfate (PROAIR RESPICLICK) 130 (90 Base) MCG/ACT AEPB Inhale 2 puffs into the lungs every 4 (four) hours. 3 each 0  . budesonide-formoterol (SYMBICORT) 160-4.5 MCG/ACT inhaler Inhale two puffs twice daily to prevent cough or wheeze. Rinse, gargle, and spit after use. 1 Inhaler 5  . cetirizine (ZYRTEC) 10 MG tablet Take 1 tablet (10 mg total) by mouth daily. 30 tablet 5  . lisinopril (PRINIVIL,ZESTRIL) 10 MG tablet Take 10 mg by mouth daily.    Marland Kitchen omeprazole (PRILOSEC) 20 MG capsule Take 20 mg by mouth 2 (two) times daily before a meal.     . triamcinolone (NASACORT ALLERGY 24HR) 55 MCG/ACT AERO nasal inhaler Place 2 sprays into the nose daily. 1 Inhaler 5   No current facility-administered medications for this visit.     Allergies as of 06/15/2018  . (No Known Allergies)    Family History  Problem Relation Age of Onset  . Asthma Brother   . Diabetes Father   . Hypertension Father   . Diabetes Paternal Grandmother   . Hypertension Paternal Grandmother   . Migraines Mother   . GER disease Mother   . Pancreatic cancer Maternal Grandmother   . Heart disease Maternal Grandfather     Social History   Socioeconomic History  .  Marital status: Married    Spouse name: Not on file  . Number of children: 1  . Years of education: Not on file  . Highest education level: Not on file  Occupational History  . Occupation: delivery driver  Social Needs  . Financial resource strain: Not on file  . Food insecurity:    Worry: Not on file    Inability: Not on file  . Transportation needs:    Medical: Not on file    Non-medical: Not on file  Tobacco Use  . Smoking status: Never Smoker  . Smokeless tobacco: Never Used  Substance and Sexual Activity  . Alcohol use: Yes    Comment: rarely 2-3 times a year  . Drug use: No  . Sexual activity: Not on file  Lifestyle  .  Physical activity:    Days per week: Not on file    Minutes per session: Not on file  . Stress: Not on file  Relationships  . Social connections:    Talks on phone: Not on file    Gets together: Not on file    Attends religious service: Not on file    Active member of club or organization: Not on file    Attends meetings of clubs or organizations: Not on file    Relationship status: Not on file  . Intimate partner violence:    Fear of current or ex partner: Not on file    Emotionally abused: Not on file    Physically abused: Not on file    Forced sexual activity: Not on file  Other Topics Concern  . Not on file  Social History Narrative  . Not on file    Review of Systems:    Constitutional: No weight loss, fever or chills Cardiovascular: No chest pain Respiratory: No SOB Gastrointestinal: See HPI and otherwise negative   Physical Exam:  Vital signs: BP 138/82   Pulse 79   Resp 16   Ht 5' 8.5" (1.74 m)   Wt 298 lb 3.2 oz (135.3 kg)   SpO2 95%   BMI 44.68 kg/m   Constitutional:   Pleasant obese Caucasian male appears to be in NAD, Well developed, Well nourished, alert and cooperative Respiratory: Respirations even and unlabored. Lungs clear to auscultation bilaterally.   No wheezes, crackles, or rhonchi.  Cardiovascular: Normal S1, S2. No MRG. Regular rate and rhythm. No peripheral edema, cyanosis or pallor.  Gastrointestinal:  Soft, nondistended, nontender. No rebound or guarding. Normal bowel sounds. No appreciable masses or hepatomegaly. Psychiatric:  Demonstrates good judgement and reason without abnormal affect or behaviors.  SEE HPI for recent labs.  Assessment: 1.  Epigastric pain: Consider relation to elevated lipase and possible pancreatitis versus reflux/gastritis 2.  GERD: Per patient intermittent symptoms regardless of Omeprazole 20 mg daily 3.  Elevated lipase: Recently 122 on 06/08/2018, previous episode of acute pancreatitis in October 2018 with no known  etiology, there was gallbladder sludge on ultrasound  Plan: 1.  Ordered labs to include a repeat lipase and CMP as well as triglycerides today 2.  Increased patient's Omeprazole to 40 mg daily, 30-60 minutes before breakfast #30 with 5 refills 3.  Discussed antireflux diet and lifestyle modifications.  Also recommend the patient abstain from alcohol in the future. 4.  If patient's lipase is continually elevated could consider further imaging; CT versus MRI/MRCP 5.  Patient to follow in clinic with Dr. Ardis Hughs or myself per recommendations after labs above  Ellouise Newer, PA-C [facility] 06/15/2018,  2:14 PM  Cc: Tamsen Roers, MD

## 2018-06-18 NOTE — Progress Notes (Signed)
I agree with the above note, plan 

## 2018-09-06 ENCOUNTER — Other Ambulatory Visit: Payer: Self-pay | Admitting: Allergy and Immunology

## 2018-10-25 ENCOUNTER — Ambulatory Visit: Payer: Managed Care, Other (non HMO) | Admitting: Podiatry

## 2018-11-01 ENCOUNTER — Ambulatory Visit: Payer: Managed Care, Other (non HMO) | Admitting: Podiatry

## 2018-12-17 ENCOUNTER — Telehealth: Payer: Self-pay | Admitting: *Deleted

## 2018-12-17 ENCOUNTER — Other Ambulatory Visit: Payer: Self-pay | Admitting: *Deleted

## 2018-12-17 NOTE — Telephone Encounter (Signed)
Denied refill for Symbicort. Faxed denial to pharmacy. Patient needs office visit.

## 2018-12-17 NOTE — Telephone Encounter (Signed)
error 

## 2019-02-21 ENCOUNTER — Other Ambulatory Visit: Payer: Self-pay | Admitting: *Deleted

## 2019-02-21 NOTE — Telephone Encounter (Signed)
Received refill request for Proair Respiclick faxed back denial to [facility] [phone]. OV needed

## 2019-04-05 ENCOUNTER — Ambulatory Visit
Admission: RE | Admit: 2019-04-05 | Discharge: 2019-04-05 | Disposition: A | Discharge status: Home / Self Care | Payer: Managed Care, Other (non HMO) | Source: Ambulatory Visit | Attending: *Deleted | Admitting: *Deleted

## 2019-04-05 ENCOUNTER — Other Ambulatory Visit: Payer: Self-pay | Admitting: *Deleted

## 2019-04-05 DIAGNOSIS — R1030 Lower abdominal pain, unspecified: Secondary | ICD-10-CM

## 2019-04-13 ENCOUNTER — Emergency Department (HOSPITAL_COMMUNITY): Payer: Managed Care, Other (non HMO)

## 2019-04-13 ENCOUNTER — Emergency Department (HOSPITAL_COMMUNITY)
Admission: EM | Admit: 2019-04-13 | Discharge: 2019-04-13 | Disposition: A | Discharge status: Home / Self Care | Payer: Managed Care, Other (non HMO) | Attending: Emergency Medicine | Admitting: Emergency Medicine

## 2019-04-13 DIAGNOSIS — Z79899 Other long term (current) drug therapy: Secondary | ICD-10-CM | POA: Insufficient documentation

## 2019-04-13 DIAGNOSIS — J45909 Unspecified asthma, uncomplicated: Secondary | ICD-10-CM | POA: Insufficient documentation

## 2019-04-13 DIAGNOSIS — R1032 Left lower quadrant pain: Secondary | ICD-10-CM | POA: Diagnosis present

## 2019-04-13 DIAGNOSIS — I10 Essential (primary) hypertension: Secondary | ICD-10-CM | POA: Diagnosis not present

## 2019-04-13 DIAGNOSIS — N200 Calculus of kidney: Secondary | ICD-10-CM | POA: Diagnosis not present

## 2019-04-13 LAB — CBC WITH DIFFERENTIAL/PLATELET
Abs Immature Granulocytes: 0.09 10*3/uL — ABNORMAL HIGH (ref 0.00–0.07)
Basophils Absolute: 0.1 10*3/uL (ref 0.0–0.1)
Basophils Relative: 1 %
Eosinophils Absolute: 0.2 10*3/uL (ref 0.0–0.5)
Eosinophils Relative: 2 %
HCT: 42.8 % (ref 39.0–52.0)
Hemoglobin: 13.9 g/dL (ref 13.0–17.0)
Immature Granulocytes: 1 %
Lymphocytes Relative: 29 %
Lymphs Abs: 3.4 10*3/uL (ref 0.7–4.0)
MCH: 27.3 pg (ref 26.0–34.0)
MCHC: 32.5 g/dL (ref 30.0–36.0)
MCV: 84.1 fL (ref 80.0–100.0)
Monocytes Absolute: 0.7 10*3/uL (ref 0.1–1.0)
Monocytes Relative: 6 %
Neutro Abs: 7.2 10*3/uL (ref 1.7–7.7)
Neutrophils Relative %: 61 %
Platelets: 173 10*3/uL (ref 150–400)
RBC: 5.09 MIL/uL (ref 4.22–5.81)
RDW: 12.3 % (ref 11.5–15.5)
WBC: 11.6 10*3/uL — ABNORMAL HIGH (ref 4.0–10.5)
nRBC: 0 % (ref 0.0–0.2)

## 2019-04-13 LAB — URINALYSIS, ROUTINE W REFLEX MICROSCOPIC
Bilirubin Urine: NEGATIVE
Glucose, UA: NEGATIVE mg/dL
Ketones, ur: NEGATIVE mg/dL
Leukocytes,Ua: NEGATIVE
Nitrite: NEGATIVE
Protein, ur: NEGATIVE mg/dL
RBC / HPF: >50 RBC/hpf — ABNORMAL HIGH (ref 0–5)
Specific Gravity, Urine: 1.017 (ref 1.005–1.030)
pH: 5 (ref 5.0–8.0)

## 2019-04-13 LAB — COMPREHENSIVE METABOLIC PANEL WITH GFR
ALT: 21 U/L (ref 0–44)
AST: 20 U/L (ref 15–41)
Albumin: 3.7 g/dL (ref 3.5–5.0)
Alkaline Phosphatase: 70 U/L (ref 38–126)
Anion gap: 12 (ref 5–15)
BUN: 14 mg/dL (ref 6–20)
CO2: 24 mmol/L (ref 22–32)
Calcium: 9.2 mg/dL (ref 8.9–10.3)
Chloride: 102 mmol/L (ref 98–111)
Creatinine, Ser: 1.02 mg/dL (ref 0.61–1.24)
GFR calc Af Amer: >60 mL/min
GFR calc non Af Amer: >60 mL/min
Glucose, Bld: 121 mg/dL — ABNORMAL HIGH (ref 70–99)
Potassium: 3.2 mmol/L — ABNORMAL LOW (ref 3.5–5.1)
Sodium: 138 mmol/L (ref 135–145)
Total Bilirubin: 0.6 mg/dL (ref 0.3–1.2)
Total Protein: 7.2 g/dL (ref 6.5–8.1)

## 2019-04-13 LAB — LIPASE, BLOOD: Lipase: 35 U/L (ref 11–51)

## 2019-04-13 MED ORDER — IOHEXOL 300 MG/ML  SOLN
100.0000 mL | Freq: Once | INTRAMUSCULAR | Status: AC | PRN
  Administered 2019-04-13: 100 mL via INTRAVENOUS

## 2019-04-13 MED ORDER — ONDANSETRON HCL 4 MG PO TABS: 4.0000 mg | ORAL_TABLET | Freq: Four times a day (QID) | ORAL | 0 refills | Status: AC

## 2019-04-13 MED ORDER — HYDROMORPHONE HCL 1 MG/ML IJ SOLN
1.0000 mg | Freq: Once | INTRAMUSCULAR | Status: AC
  Administered 2019-04-13: 1 mg via INTRAVENOUS
  Filled 2019-04-13: qty 1

## 2019-04-13 MED ORDER — OXYCODONE HCL 5 MG PO TABS: 5.0000 mg | ORAL_TABLET | Freq: Four times a day (QID) | ORAL | 0 refills | Status: DC | PRN

## 2019-04-13 MED ORDER — OXYCODONE HCL 5 MG PO TABS
5.0000 mg | ORAL_TABLET | Freq: Once | ORAL | Status: AC
  Administered 2019-04-13: 5 mg via ORAL
  Filled 2019-04-13: qty 1

## 2019-04-13 MED ORDER — SODIUM CHLORIDE 0.9 % IV BOLUS
1000.0000 mL | Freq: Once | INTRAVENOUS | Status: AC
  Administered 2019-04-13: 1000 mL via INTRAVENOUS

## 2019-04-13 MED ORDER — ONDANSETRON HCL 4 MG/2ML IJ SOLN
4.0000 mg | Freq: Once | INTRAMUSCULAR | Status: AC
  Administered 2019-04-13: 4 mg via INTRAVENOUS
  Filled 2019-04-13: qty 2

## 2019-04-13 NOTE — ED Provider Notes (Signed)
[facility] EMERGENCY DEPARTMENT Provider Note   CSN: [id number] Arrival date & time: 04/13/19  1721     History   Chief Complaint Chief Complaint  Patient presents with  . Abdominal Pain    HPI [name] is a 43 y.o. male.     The history is provided by the patient.  Abdominal Pain Pain location:  LLQ and L flank Pain quality: aching and dull   Pain radiates to:  Does not radiate Pain severity:  Moderate Onset quality:  Sudden Duration:  1 hour Timing:  Constant Progression:  Unchanged Chronicity:  New Context comment:  Has known left inguinal hernia but pain is higher up in his left lower abdomen and left flank today. Had normal BM today, no hx of kidney stones. No testicular pain  Relieved by:  Nothing Worsened by:  Nothing Associated symptoms: nausea   Associated symptoms: no anorexia, no belching, no chest pain, no chills, no constipation, no cough, no diarrhea, no dysuria, no fatigue, no fever, no hematuria, no melena, no shortness of breath, no sore throat and no vomiting   Risk factors: obesity   Risk factors: has not had multiple surgeries     Past Medical History:  Diagnosis Date  . Asthma   . HTN (hypertension)   . Pancreatitis     Patient Active Problem List   Diagnosis Date Noted  . Fever   . Gram positive bacterial infection   . Septic arthritis of right acromioclavicular joint ([place]) 09/29/2017  . Chronic left-sided low back pain with left-sided sciatica 09/19/2017  . Epigastric pain   . Pancreatitis 04/17/2017  . Essential hypertension 04/17/2017  . Moderate persistent asthma 07/21/2015  . Allergic rhinoconjunctivitis 07/21/2015  . Esophageal reflux 07/21/2015  . ABNORMALITY OF GAIT 11/12/2009  . TARSAL TUNNEL SYNDROME, LEFT 10/08/2009  . PES PLANUS 10/08/2009    Past Surgical History:  Procedure Laterality Date  . IRRIGATION AND DEBRIDEMENT SHOULDER Right 09/29/2017   Procedure: IRRIGATION AND DEBRIDEMENT  SHOULDER;  Surgeon: Leandrew Koyanagi, MD;  Location: [facility];  Service: Orthopedics;  Laterality: Right;  . LUMBAR DISC SURGERY  2002        Home Medications    Prior to Admission medications   Medication Sig Start Date End Date Taking? Authorizing Provider  budesonide-formoterol (SYMBICORT) 160-4.5 MCG/ACT inhaler Inhale two puffs twice daily to prevent cough or wheeze. Rinse, gargle, and spit after use. 02/06/18   Kozlow, Donnamarie Poag, MD  cetirizine (ZYRTEC) 10 MG tablet Take 1 tablet (10 mg total) by mouth daily. 02/06/18   Kozlow, Donnamarie Poag, MD  lisinopril (PRINIVIL,ZESTRIL) 10 MG tablet Take 10 mg by mouth daily. 04/16/17   [provider]  omeprazole (PRILOSEC) 20 MG capsule Take 20 mg by mouth 2 (two) times daily before a meal.     [provider]  omeprazole (PRILOSEC) 40 MG capsule Take 1 capsule (40 mg total) by mouth daily. Take one 30-60 minutes before breakfast. 06/15/18   Lemmon, Lavone Nian, PA  ondansetron (ZOFRAN) 4 MG tablet Take 1 tablet (4 mg total) by mouth every 6 (six) hours for 12 doses. 04/13/19 04/16/19  Itzae Miralles, DO  oxyCODONE (ROXICODONE) 5 MG immediate release tablet Take 1 tablet (5 mg total) by mouth every 6 (six) hours as needed for up to 12 doses for severe pain. 04/13/19   Muslima Toppins, DO  PROAIR RESPICLICK 024 (90 Base) MCG/ACT AEPB INHALE 2 PUFFS BY MOUTH EVERY 4 HOURS 09/06/18  Kozlow, Donnamarie Poag, MD  triamcinolone (NASACORT ALLERGY 24HR) 55 MCG/ACT AERO nasal inhaler Place 2 sprays into the nose daily. 02/06/18   Kozlow, Donnamarie Poag, MD    Family History Family History  Problem Relation Age of Onset  . Asthma Brother   . Diabetes Father   . Hypertension Father   . Diabetes Paternal Grandmother   . Hypertension Paternal Grandmother   . Migraines Mother   . GER disease Mother   . Pancreatic cancer Maternal Grandmother   . Heart disease Maternal Grandfather     Social History Social History   Tobacco Use  . Smoking status: Never Smoker  . Smokeless  tobacco: Never Used  Substance Use Topics  . Alcohol use: Yes    Comment: rarely 2-3 times a year  . Drug use: No     Allergies   Patient has no known allergies.   Review of Systems Review of Systems  Constitutional: Negative for chills, fatigue and fever.  HENT: Negative for ear pain and sore throat.   Eyes: Negative for pain and visual disturbance.  Respiratory: Negative for cough and shortness of breath.   Cardiovascular: Negative for chest pain and palpitations.  Gastrointestinal: Positive for abdominal pain and nausea. Negative for abdominal distention, anal bleeding, anorexia, constipation, diarrhea, melena and vomiting.  Genitourinary: Negative for dysuria and hematuria.  Musculoskeletal: Negative for arthralgias and back pain.  Skin: Negative for color change and rash.  Neurological: Negative for seizures and syncope.  All other systems reviewed and are negative.    Physical Exam Updated Vital Signs  ED Triage Vitals  Enc Vitals Group     BP 04/13/19 1730 (!) 159/65     Pulse Rate 04/13/19 1731 (!) 103     Resp 04/13/19 1731 18     Temp 04/13/19 1731 98 F (36.7 C)     Temp Source 04/13/19 1731 Oral     SpO2 04/13/19 1731 91 %     Weight --      Height --      Head Circumference --      Peak Flow --      Pain Score 04/13/19 1740 10     Pain Loc --      Pain Edu? --      Excl. in [place]? --     Physical Exam Vitals signs and nursing note reviewed.  Constitutional:      Appearance: He is well-developed.  HENT:     Head: Normocephalic and atraumatic.  Eyes:     Extraocular Movements: Extraocular movements intact.     Conjunctiva/sclera: Conjunctivae normal.     Pupils: Pupils are equal, round, and reactive to light.  Neck:     Musculoskeletal: Neck supple.  Cardiovascular:     Rate and Rhythm: Normal rate and regular rhythm.     Heart sounds: Normal heart sounds. No murmur.  Pulmonary:     Effort: Pulmonary effort is normal. No respiratory distress.      Breath sounds: Normal breath sounds.  Abdominal:     General: There is no distension.     Palpations: Abdomen is soft.     Tenderness: There is abdominal tenderness in the left upper quadrant and left lower quadrant. There is left CVA tenderness. There is no right CVA tenderness, guarding or rebound. Negative signs include Murphy's sign, Rovsing's sign and McBurney's sign.     Hernia: No hernia is present. There is no hernia in the left inguinal area (unable to  appreciate obvious hernia, large body habitus ) or right inguinal area.  Genitourinary:    Scrotum/Testes: Normal.        Right: Mass, tenderness or swelling not present.        Left: Mass, tenderness or swelling not present.  Skin:    General: Skin is warm and dry.     Capillary Refill: Capillary refill takes less than 2 seconds.  Neurological:     General: No focal deficit present.     Mental Status: He is alert.  Psychiatric:        Mood and Affect: Mood normal.      ED Treatments / Results  Labs (all labs ordered are listed, but only abnormal results are displayed) Labs Reviewed  COMPREHENSIVE METABOLIC PANEL - Abnormal; Notable for the following components:      Result Value   Potassium 3.2 (*)    Glucose, Bld 121 (*)    All other components within normal limits  CBC WITH DIFFERENTIAL/PLATELET - Abnormal; Notable for the following components:   WBC 11.6 (*)    Abs Immature Granulocytes 0.09 (*)    All other components within normal limits  URINALYSIS, ROUTINE W REFLEX MICROSCOPIC - Abnormal; Notable for the following components:   APPearance HAZY (*)    Hgb urine dipstick LARGE (*)    RBC / HPF >50 (*)    Bacteria, UA RARE (*)    All other components within normal limits  LIPASE, BLOOD    EKG None  Radiology Ct Abdomen Pelvis W Contrast  Result Date: 04/13/2019 CLINICAL DATA:  Per ed notes: Pt was in the car and out of no where gets this 10/10 pain on the right side that radiates to his back. EXAM: CT  ABDOMEN AND PELVIS WITH CONTRAST TECHNIQUE: Multidetector CT imaging of the abdomen and pelvis was performed using the standard protocol following bolus administration of intravenous contrast. CONTRAST:  133m OMNIPAQUE IOHEXOL 300 MG/ML  SOLN COMPARISON:  None. FINDINGS: Lower chest: Stable bilateral 2-3 mm subpleural pulmonary nodules. Hepatobiliary: No focal liver abnormality is seen. No gallstones, gallbladder wall thickening, or biliary dilatation. Pancreas: Unremarkable. No pancreatic ductal dilatation or surrounding inflammatory changes. Spleen: Normal in size without focal abnormality. Adrenals/Urinary Tract: Adrenal glands are unremarkable. There is a too small to characterize hypodense lesion in the superior pole the left kidney. There is a 5 mm calculus in the very proximal left ureter with a small amount of upstream hydronephrosis. No right renal calculi. Bladder is unremarkable. Stomach/Bowel: Stomach is within normal limits. Appendix appears normal. No evidence of bowel wall thickening, distention, or inflammatory changes. Vascular/Lymphatic: Minimal atherosclerotic calcification in the common iliac arteries. No enlarged abdominal or pelvic lymph nodes. Reproductive: Prostate is unremarkable. Other: No abdominal wall hernia or abnormality. No abdominopelvic ascites. Musculoskeletal: Midthoracic and lower lumbar degenerative changes. IMPRESSION: 1. 5 mm calculus in the very proximal LEFT ureter with a small amount of upstream hydronephrosis. 2.  No other acute intra-abdominal or intrapelvic process. Electronically Signed   By: NAudie PintoM.D.   On: 04/13/2019 19:27    Procedures Procedures (including critical care time)  Medications Ordered in ED Medications  oxyCODONE (Oxy IR/ROXICODONE) immediate release tablet 5 mg (has no administration in time range)  sodium chloride 0.9 % bolus 1,000 mL (0 mLs Intravenous Stopped 04/13/19 1927)  HYDROmorphone (DILAUDID) injection 1 mg (1 mg  Intravenous Given 04/13/19 1752)  ondansetron (ZOFRAN) injection 4 mg (4 mg Intravenous Given 04/13/19 1751)  iohexol (OMNIPAQUE) 300 MG/ML  solution 100 mL (100 mLs Intravenous Contrast Given 04/13/19 1900)     Initial Impression / Assessment and Plan / ED Course  I have reviewed the triage vital signs and the nursing notes.  Pertinent labs & imaging results that were available during my care of the patient were reviewed by me and considered in my medical decision making (see chart for details).     [name] is a 43 year old male with history of asthma, hypertension, pancreatitis who presents to the ED with abdominal pain.  Patient with left lower quadrant abdominal pain suddenly about an hour ago.  Radiates up to his left flank.  No history of kidney stones.  No hematuria.  Has had nausea but no vomiting.  States that he has a left inguinal hernia that he is supposed to follow-up with surgery follow-up with surgery with next week.  No obvious hernia on exam.  Testicular exam is unremarkable.  Patient mostly tender in the left lower quadrant, left upper quadrant, left flank.  No signs of peritonitis.  States that he had a normal bowel movement this morning.  No history of major abdominal surgeries.  Concern for possible kidney stone versus pancreatitis versus hernia versus obstruction.  Will get labs, CT abdomen and pelvis.  Will treat with IV Dilaudid, IV fluids, IV antiemetics.  Will reevaluate. Has good pulses and no chest pain, lower concern for dissection.   CT scan showed a 5 mm kidney stone in the very proximal left ureter with small amount of hydronephrosis.  Otherwise urinalysis unremarkable.  No significant anemia, electrolyte abnormality, kidney injury.  Patient felt much better after IV fluids and Dilaudid.  Has no pain on reevaluation.  Suspect that this kidney stone will likely pass given its size.  No active narcotic scripts on the database.  Did recently have a prescription but appears  to have been only for 5 days and should be able to fill new prescription.  Will give Zofran prescription.  Given information to follow-up with urology. Told to return to the ED if symptoms worsen as well.  This chart was dictated using voice recognition software.  Despite best efforts to proofread,  errors can occur which can change the documentation meaning.    Final Clinical Impressions(s) / ED Diagnoses   Final diagnoses:  Kidney stone    ED Discharge Orders         Ordered    oxyCODONE (ROXICODONE) 5 MG immediate release tablet  Every 6 hours PRN     04/13/19 2009    ondansetron (ZOFRAN) 4 MG tablet  Every 6 hours     04/13/19 2009           Lennice Sites, DO 04/13/19 2012

## 2019-04-13 NOTE — ED Triage Notes (Signed)
Pt was in the car and out of no where gets this 10/10 pain on the right side that radiates to his back.

## 2019-04-13 NOTE — ED Notes (Signed)
ED Provider at bedside. 

## 2019-04-17 ENCOUNTER — Encounter (HOSPITAL_BASED_OUTPATIENT_CLINIC_OR_DEPARTMENT_OTHER): Payer: Self-pay | Admitting: *Deleted

## 2019-04-17 ENCOUNTER — Other Ambulatory Visit (HOSPITAL_COMMUNITY)
Admission: RE | Admit: 2019-04-17 | Discharge: 2019-04-17 | Disposition: A | Discharge status: Home / Self Care | Payer: Managed Care, Other (non HMO) | Source: Ambulatory Visit | Attending: Urology | Admitting: Urology

## 2019-04-17 ENCOUNTER — Other Ambulatory Visit: Payer: Self-pay

## 2019-04-17 ENCOUNTER — Other Ambulatory Visit: Payer: Self-pay | Admitting: Urology

## 2019-04-17 DIAGNOSIS — Z20828 Contact with and (suspected) exposure to other viral communicable diseases: Secondary | ICD-10-CM | POA: Diagnosis not present

## 2019-04-17 DIAGNOSIS — Z01812 Encounter for preprocedural laboratory examination: Secondary | ICD-10-CM | POA: Insufficient documentation

## 2019-04-17 LAB — SARS CORONAVIRUS 2 (TAT 6-24 HRS): SARS Coronavirus 2: NEGATIVE

## 2019-04-17 NOTE — Progress Notes (Signed)
SPOKE WITH [name] NPO AFTER MIDNIGHT, TAKE THE FOLLOWING MEDICATIONS WITH SIP OF WATER: OXYCODONE PRN, PROAIR INHALER PRN AND BRING INHALER, SYMBICORT, NASACORT NASAL SPRAY PRN, OMEPRAZOLE, CERTRIZINE. SPOKE WITH JESSICA ZANETTO PA AND MADE AWARE PATIENT MEDICAL HISTORY AND BMI 45.92 PATIENT DOES NOT NEED TO COME IN FOR ANESTHESIA AIRWAY EVALUATION PER ZESSICA ZANETTO PA. LABS DONE 04/13/2019 ON CHART /Epic: CBC WITH DIF, CMET, LIPASE UA. NEEDS EKG. HAS SURGERY ORDERS IN Epic. DRIVER WILL BE WIFE [name] OR MOTHER [name]

## 2019-04-18 ENCOUNTER — Ambulatory Visit (HOSPITAL_BASED_OUTPATIENT_CLINIC_OR_DEPARTMENT_OTHER)
Admission: RE | Admit: 2019-04-18 | Discharge: 2019-04-18 | Disposition: A | Discharge status: Home / Self Care | Payer: Managed Care, Other (non HMO) | Attending: Urology | Admitting: Urology

## 2019-04-18 ENCOUNTER — Encounter (HOSPITAL_BASED_OUTPATIENT_CLINIC_OR_DEPARTMENT_OTHER): Payer: Self-pay

## 2019-04-18 ENCOUNTER — Ambulatory Visit (HOSPITAL_BASED_OUTPATIENT_CLINIC_OR_DEPARTMENT_OTHER): Payer: Managed Care, Other (non HMO) | Admitting: Anesthesiology

## 2019-04-18 ENCOUNTER — Other Ambulatory Visit: Payer: Self-pay

## 2019-04-18 ENCOUNTER — Encounter (HOSPITAL_BASED_OUTPATIENT_CLINIC_OR_DEPARTMENT_OTHER)
Admission: RE | Disposition: A | Discharge status: Home / Self Care | Payer: Self-pay | Source: Home / Self Care | Attending: Urology

## 2019-04-18 DIAGNOSIS — Z79899 Other long term (current) drug therapy: Secondary | ICD-10-CM | POA: Insufficient documentation

## 2019-04-18 DIAGNOSIS — I10 Essential (primary) hypertension: Secondary | ICD-10-CM | POA: Diagnosis not present

## 2019-04-18 DIAGNOSIS — J45909 Unspecified asthma, uncomplicated: Secondary | ICD-10-CM | POA: Insufficient documentation

## 2019-04-18 DIAGNOSIS — N201 Calculus of ureter: Secondary | ICD-10-CM | POA: Insufficient documentation

## 2019-04-18 DIAGNOSIS — N200 Calculus of kidney: Secondary | ICD-10-CM

## 2019-04-18 DIAGNOSIS — K219 Gastro-esophageal reflux disease without esophagitis: Secondary | ICD-10-CM | POA: Diagnosis not present

## 2019-04-18 HISTORY — DX: Headache, unspecified: R51.9

## 2019-04-18 HISTORY — PX: CYSTOSCOPY/URETEROSCOPY/HOLMIUM LASER/STENT PLACEMENT: SHX6546

## 2019-04-18 HISTORY — DX: Gastro-esophageal reflux disease without esophagitis: K21.9

## 2019-04-18 HISTORY — DX: Personal history of urinary calculi: Z87.442

## 2019-04-18 SURGERY — CYSTOSCOPY/URETEROSCOPY/HOLMIUM LASER/STENT PLACEMENT
Anesthesia: General | Site: Ureter | Laterality: Left

## 2019-04-18 MED ORDER — DEXAMETHASONE SODIUM PHOSPHATE 10 MG/ML IJ SOLN
INTRAMUSCULAR | Status: AC
  Filled 2019-04-18: qty 1

## 2019-04-18 MED ORDER — DEXAMETHASONE SODIUM PHOSPHATE 10 MG/ML IJ SOLN
INTRAMUSCULAR | Status: DC | PRN
  Administered 2019-04-18: 10 mg via INTRAVENOUS

## 2019-04-18 MED ORDER — LIDOCAINE HCL URETHRAL/MUCOSAL 2 % EX GEL
CUTANEOUS | Status: DC | PRN
  Administered 2019-04-18: 1 via URETHRAL

## 2019-04-18 MED ORDER — PROPOFOL 10 MG/ML IV BOLUS
INTRAVENOUS | Status: DC | PRN
  Administered 2019-04-18: 200 mg via INTRAVENOUS
  Administered 2019-04-18: 130 mg via INTRAVENOUS

## 2019-04-18 MED ORDER — BELLADONNA ALKALOIDS-OPIUM 16.2-60 MG RE SUPP
RECTAL | Status: DC | PRN
  Administered 2019-04-18: 1 via RECTAL

## 2019-04-18 MED ORDER — CIPROFLOXACIN HCL 500 MG PO TABS: 500.0000 mg | ORAL_TABLET | Freq: Two times a day (BID) | ORAL | 0 refills | Status: DC

## 2019-04-18 MED ORDER — IOHEXOL 300 MG/ML  SOLN
INTRAMUSCULAR | Status: DC | PRN
  Administered 2019-04-18: 4 mL via URETHRAL

## 2019-04-18 MED ORDER — CIPROFLOXACIN HCL 500 MG PO TABS: 500.0000 mg | ORAL_TABLET | Freq: Once | ORAL | 0 refills | Status: DC

## 2019-04-18 MED ORDER — CEFAZOLIN SODIUM-DEXTROSE 1-4 GM/50ML-% IV SOLN
INTRAVENOUS | Status: AC
  Filled 2019-04-18: qty 50

## 2019-04-18 MED ORDER — FENTANYL CITRATE (PF) 100 MCG/2ML IJ SOLN
INTRAMUSCULAR | Status: DC | PRN
  Administered 2019-04-18 (×2): 50 ug via INTRAVENOUS

## 2019-04-18 MED ORDER — DEXTROSE 5 % IV SOLN
3.0000 g | INTRAVENOUS | Status: AC
  Administered 2019-04-18: 11:00:00 3 g via INTRAVENOUS
  Filled 2019-04-18: qty 3000

## 2019-04-18 MED ORDER — KETOROLAC TROMETHAMINE 30 MG/ML IJ SOLN
INTRAMUSCULAR | Status: AC
  Filled 2019-04-18: qty 1

## 2019-04-18 MED ORDER — BELLADONNA ALKALOIDS-OPIUM 16.2-60 MG RE SUPP
RECTAL | Status: AC
  Filled 2019-04-18: qty 1

## 2019-04-18 MED ORDER — ONDANSETRON HCL 4 MG/2ML IJ SOLN
INTRAMUSCULAR | Status: AC
  Filled 2019-04-18: qty 2

## 2019-04-18 MED ORDER — PHENAZOPYRIDINE HCL 200 MG PO TABS: 200.0000 mg | ORAL_TABLET | Freq: Three times a day (TID) | ORAL | 0 refills | Status: DC | PRN

## 2019-04-18 MED ORDER — ONDANSETRON HCL 4 MG/2ML IJ SOLN
INTRAMUSCULAR | Status: DC | PRN
  Administered 2019-04-18: 4 mg via INTRAVENOUS

## 2019-04-18 MED ORDER — LIDOCAINE 2% (20 MG/ML) 5 ML SYRINGE
INTRAMUSCULAR | Status: AC
  Filled 2019-04-18: qty 5

## 2019-04-18 MED ORDER — PROPOFOL 10 MG/ML IV BOLUS
INTRAVENOUS | Status: AC
  Filled 2019-04-18: qty 40

## 2019-04-18 MED ORDER — MIDAZOLAM HCL 5 MG/5ML IJ SOLN
INTRAMUSCULAR | Status: DC | PRN
  Administered 2019-04-18: 2 mg via INTRAVENOUS

## 2019-04-18 MED ORDER — SODIUM CHLORIDE 0.9 % IR SOLN
Status: DC | PRN
  Administered 2019-04-18: 3000 mL via INTRAVESICAL

## 2019-04-18 MED ORDER — MIDAZOLAM HCL 2 MG/2ML IJ SOLN
INTRAMUSCULAR | Status: AC
  Filled 2019-04-18: qty 2

## 2019-04-18 MED ORDER — FENTANYL CITRATE (PF) 100 MCG/2ML IJ SOLN
INTRAMUSCULAR | Status: AC
  Filled 2019-04-18: qty 2

## 2019-04-18 MED ORDER — PHENAZOPYRIDINE HCL 100 MG PO TABS
ORAL_TABLET | ORAL | Status: AC
  Filled 2019-04-18: qty 2

## 2019-04-18 MED ORDER — LACTATED RINGERS IV SOLN
INTRAVENOUS | Status: DC
  Administered 2019-04-18: 1000 mL via INTRAVENOUS
  Filled 2019-04-18: qty 1000

## 2019-04-18 MED ORDER — LIDOCAINE 2% (20 MG/ML) 5 ML SYRINGE
INTRAMUSCULAR | Status: DC | PRN
  Administered 2019-04-18: 100 mg via INTRAVENOUS

## 2019-04-18 MED ORDER — KETOROLAC TROMETHAMINE 30 MG/ML IJ SOLN
30.0000 mg | Freq: Once | INTRAMUSCULAR | Status: AC
  Administered 2019-04-18: 30 mg via INTRAVENOUS
  Filled 2019-04-18: qty 1

## 2019-04-18 MED ORDER — FENTANYL CITRATE (PF) 100 MCG/2ML IJ SOLN
25.0000 ug | INTRAMUSCULAR | Status: DC | PRN
  Administered 2019-04-18: 50 ug via INTRAVENOUS
  Filled 2019-04-18: qty 1

## 2019-04-18 MED ORDER — PHENAZOPYRIDINE HCL 200 MG PO TABS
200.0000 mg | ORAL_TABLET | Freq: Once | ORAL | Status: AC
  Administered 2019-04-18: 14:00:00 200 mg via ORAL
  Filled 2019-04-18: qty 1

## 2019-04-18 MED ORDER — CEFAZOLIN SODIUM-DEXTROSE 2-4 GM/100ML-% IV SOLN
INTRAVENOUS | Status: AC
  Filled 2019-04-18: qty 100

## 2019-04-18 SURGICAL SUPPLY — 20 items
BAG DRAIN URO-CYSTO SKYTR STRL (DRAIN) ×2 IMPLANT
BAG DRN UROCATH (DRAIN) ×1
BASKET LASER NITINOL 1.9FR (BASKET) IMPLANT
BSKT STON RTRVL 120 1.9FR (BASKET)
CATH URET 5FR 28IN OPEN ENDED (CATHETERS) ×2 IMPLANT
CATH URET DUAL LUMEN 6-10FR 50 (CATHETERS) IMPLANT
CLOTH BEACON ORANGE TIMEOUT ST (SAFETY) ×1 IMPLANT
EXTRACTOR STONE 1.7FRX115CM (UROLOGICAL SUPPLIES) IMPLANT
FIBER LASER TRAC TIP (UROLOGICAL SUPPLIES) IMPLANT
GLOVE BIO SURGEON STRL SZ7.5 (GLOVE) ×2 IMPLANT
GOWN STRL REUS W/TWL XL LVL3 (GOWN DISPOSABLE) ×2 IMPLANT
GUIDEWIRE STR DUAL SENSOR (WIRE) ×3 IMPLANT
IV NS IRRIG 3000ML ARTHROMATIC (IV SOLUTION) ×3 IMPLANT
KIT TURNOVER CYSTO (KITS) ×2 IMPLANT
MANIFOLD NEPTUNE II (INSTRUMENTS) ×1 IMPLANT
NS IRRIG 500ML POUR BTL (IV SOLUTION) ×2 IMPLANT
PACK CYSTO (CUSTOM PROCEDURE TRAY) ×2 IMPLANT
STENT URET 6FRX26 CONTOUR (STENTS) ×1 IMPLANT
TUBE CONNECTING 12X1/4 (SUCTIONS) ×1 IMPLANT
TUBING UROLOGY SET (TUBING) ×2 IMPLANT

## 2019-04-18 NOTE — Transfer of Care (Signed)
Immediate Anesthesia Transfer of Care Note  Patient: [name]  Procedure(s) Performed: LEFT URETEROSCOPY/HOLMIUM LASER/STENT PLACEMENT (Left Ureter)  Patient Location: PACU  Anesthesia Type:General  Level of Consciousness: drowsy and patient cooperative  Airway & Oxygen Therapy: Patient Spontanous Breathing and Patient connected to face mask oxygen  Post-op Assessment: Report given to RN and Post -op Vital signs reviewed and stable  Post vital signs: Reviewed and stable  Last Vitals:  Vitals Value Taken Time  BP 127/66 04/18/19 1211  Temp 36.6 C 04/18/19 1211  Pulse 67 04/18/19 1214  Resp 13 04/18/19 1214  SpO2 100 % 04/18/19 1214  Vitals shown include unvalidated device data.  Last Pain:  Vitals:   04/18/19 1025  TempSrc: Oral  PainSc: 8       Patients Stated Pain Goal: 5 (91/50/41 3643)  Complications: No apparent anesthesia complications

## 2019-04-18 NOTE — Discharge Instructions (Signed)
DISCHARGE INSTRUCTIONS FOR KIDNEY STONE/URETERAL STENT   MEDICATIONS:  1.  Resume all your other meds from home.  2. Pyridium is to help with the burning/stinging when you urinate.   ACTIVITY:  1. No strenuous activity x 1week  2. No driving while on narcotic pain medications  3. Drink plenty of water  4. Continue to walk at home - you can still get blood clots when you are at home, so keep active, but don't over do it.  5. May return to work/school tomorrow or when you feel ready   BATHING:  1. You can shower and we recommend daily showers   SIGNS/SYMPTOMS TO CALL:  Please call us if you have a fever greater than 101.5, uncontrolled nausea/vomiting, uncontrolled pain, dizziness, unable to urinate, bloody urine, chest pain, shortness of breath, leg swelling, leg pain, redness around wound, drainage from wound, or any other concerns or questions.   You can reach [place] at [phone].   FOLLOW-UP: You will be scheduled for f/u in 1-2 weeks for removal of your stone.   Post Anesthesia Home Care Instructions  Activity: Get plenty of rest for the remainder of the day. A responsible adult should stay with you for 24 hours following the procedure.  For the next 24 hours, DO NOT: -Drive a car -Paediatric nurse -Drink alcoholic beverages -Take any medication unless instructed by your physician -Make any legal decisions or sign important papers.  Meals: Start with liquid foods such as gelatin or soup. Progress to regular foods as tolerated. Avoid greasy, spicy, heavy foods. If nausea and/or vomiting occur, drink only clear liquids until the nausea and/or vomiting subsides. Call your physician if vomiting continues.  Special Instructions/Symptoms: Your throat may feel dry or sore from the anesthesia or the breathing tube placed in your throat during surgery. If this causes discomfort, gargle with warm salt water. The discomfort should disappear within 24 hours.  If you had a  scopolamine patch placed behind your ear for the management of post- operative nausea and/or vomiting:  1. The medication in the patch is effective for 72 hours, after which it should be removed.  Wrap patch in a tissue and discard in the trash. Wash hands thoroughly with soap and water. 2. You may remove the patch earlier than 72 hours if you experience unpleasant side effects which may include dry mouth, dizziness or visual disturbances. 3. Avoid touching the patch. Wash your hands with soap and water after contact with the patch.

## 2019-04-18 NOTE — Anesthesia Procedure Notes (Signed)
Procedure Name: LMA Insertion Date/Time: 04/18/2019 11:17 AM Performed by: Raenette Rover, CRNA Pre-anesthesia Checklist: Patient identified, Emergency Drugs available, Suction available and Patient being monitored Patient Re-evaluated:Patient Re-evaluated prior to induction Oxygen Delivery Method: Circle system utilized Preoxygenation: Pre-oxygenation with 100% oxygen Induction Type: IV induction LMA: LMA inserted LMA Size: 5.0 Number of attempts: 1 Placement Confirmation: positive ETCO2 and breath sounds checked- equal and bilateral Tube secured with: Tape Dental Injury: Teeth and Oropharynx as per pre-operative assessment

## 2019-04-18 NOTE — Anesthesia Postprocedure Evaluation (Signed)
Anesthesia Post Note  Patient: [name]  Procedure(s) Performed: LEFT URETEROSCOPY/HOLMIUM LASER/STENT PLACEMENT (Left Ureter)     Patient location during evaluation: PACU Anesthesia Type: General Level of consciousness: awake Pain management: pain level controlled Vital Signs Assessment: post-procedure vital signs reviewed and stable Respiratory status: spontaneous breathing Cardiovascular status: stable Postop Assessment: no apparent nausea or vomiting Anesthetic complications: no    Last Vitals:  Vitals:   04/18/19 1313 04/18/19 1416  BP:  119/65  Pulse:  78  Resp:  16  Temp: 36.8 C 36.9 C  SpO2:  91%    Last Pain:  Vitals:   04/18/19 1416  TempSrc: Axillary  PainSc: 4                  Ashwini Jago

## 2019-04-18 NOTE — Anesthesia Preprocedure Evaluation (Signed)
Anesthesia Evaluation  Patient identified by MRN, date of birth, ID band Patient awake    Reviewed: Allergy & Precautions, NPO status , Patient's Chart, lab work & pertinent test results  Airway Mallampati: II  TM Distance: >3 FB     Dental   Pulmonary    breath sounds clear to auscultation       Cardiovascular hypertension,  Rhythm:Regular Rate:Normal     Neuro/Psych    GI/Hepatic Neg liver ROS, GERD  ,  Endo/Other  negative endocrine ROS  Renal/GU negative Renal ROS     Musculoskeletal   Abdominal   Peds  Hematology   Anesthesia Other Findings   Reproductive/Obstetrics                             Anesthesia Physical Anesthesia Plan  ASA: III  Anesthesia Plan: General   Post-op Pain Management:    Induction: Intravenous  PONV Risk Score and Plan: 2 and Ondansetron, Dexamethasone and Midazolam  Airway Management Planned: LMA  Additional Equipment:   Intra-op Plan:   Post-operative Plan:   Informed Consent: I have reviewed the patients History and Physical, chart, labs and discussed the procedure including the risks, benefits and alternatives for the proposed anesthesia with the patient or authorized representative who has indicated his/her understanding and acceptance.     Dental advisory given  Plan Discussed with: CRNA and Anesthesiologist  Anesthesia Plan Comments:         Anesthesia Quick Evaluation

## 2019-04-18 NOTE — Op Note (Addendum)
Preoperative diagnosis:  1. Left proximal ureteral stone  Postoperative diagnosis:  1. Same  Procedure: 1. Cystoscopy, retrograde pyelogram with interpretation 2. Diagnostic ureteroscopy 3. Left ureteral stent placement.  Surgeon: Ardis Hughs, MD  Anesthesia: General  Complications: None  Intraoperative findings:  #1: The patient's left ureteral orifice was on the back of the intravesical prostate making it difficult to cannulate.  Once I was able to cannulated I performed a retrograde pyelogram. #2: The retrograde pyelogram demonstrated a normal caliber ureter with a filling defect in more proximal ureteral obstruction at the left UPJ. #3: The ureteral orifice and entire ureter was very tight making it difficult to progress with the semirigid ureteroscope.  I did ultimately get the scope all the way up to the patient's left UVJ and did not encounter any stone.   #4: Ultimately, I was unable to get the flexible ureteroscope beyond the distal ureter and opted to leave a stent.  Once the stent was placed turbid urine was noted to drain from the stent itself.  I did send this for urine culture. #5: A 26 cm time 6 French double-J stent was left in the left ureter.  EBL: Minimal  Specimens: Urine was sent for culture  Indication: [name] is a 43 y.o. patient with renal colic secondary to a 5 mm left UPJ stone.  After reviewing the management options for treatment, he elected to proceed with the above surgical procedure(s). We have discussed the potential benefits and risks of the procedure, side effects of the proposed treatment, the likelihood of the patient achieving the goals of the procedure, and any potential problems that might occur during the procedure or recuperation. Informed consent has been obtained.  Description of procedure:  The patient was taken to the operating room and general anesthesia was induced.  The patient was placed in the dorsal lithotomy position,  prepped and draped in the usual sterile fashion, and preoperative antibiotics were administered. A preoperative time-out was performed.   21 French 30 degrees cystoscope was gently passed through the patient's urethra and into the bladder under visual guidance.  This demonstrated intravesical prostatic protrusion.  The ureters were in the posterior side of the intravesical prostate.  The remainder of the bladder was normal.  With some difficulty I was able to cannulate the left ureteral orifice and performed retrograde pyelogram with the above findings.  I then advanced a sensor wire through the open-ended catheter and into the left kidney.  I then exchanged the cystoscope for a semirigid ureteroscope and was able with some difficulty to cannulate the left ureteral orifice and slowly advanced it up to the renal pelvis.  I encountered no stones, but the ureter was extremely tight.  I advanced a second wire through the scope and backed scope out over the wire.  I then attempted to advance a flexible ureteroscope over the second wire was unable to advance it past the distal ureter.  At this point I opted to place a stent and stage the procedure.  I backloaded the wire through the cystoscope and then using the Seldinger technique advanced the stent over the wire and into the left renal pelvis under fluoroscopic guidance.  Once the stent was noted to be well positioned within the renal pelvis advanced into the ureter meatus and remove the wire.  Turbid urine was noted to drain from the stent and as such I opted to place the patient on antibiotics going home and sent a urine culture.  The patient was subsequently extubated and returned to the PACU in stable condition.  Disposition: The patient will be rescheduled for second stage ureteroscopy in the coming weeks, we will place him on antibiotics and follow-up the urine culture.      Ardis Hughs, M.D.

## 2019-04-18 NOTE — H&P (Signed)
Acute Kidney Stone  HPI: [name] is a 43 year-old male patient who was referred by [place] who is here for further eval and management of kidney stones.  He was diagnosed with a kidney stone on 04/13/2019. The patient presented to [facility] with symptoms of a kidney stone.   The pain is on the left side.   Abdomen/Pelvic CT: 04/13/19: 1m proximal left ureteral stone - no other calcifations. The patient underwent KUB prior to today's appointment.   The patient relates initially having nausea. He is currently having back pain. He denies having flank pain, groin pain, nausea, vomiting, fever, chills, and voiding symptoms. He has not caught a stone in his urine strainer since his symptoms began.   This is his first kidney stone.     ALLERGIES: None   MEDICATIONS: Lisinopril 10 mg tablet  Omeprazole 20 mg tablet, delayed release  Zyrtec 10 mg tablet  Proair Hfa  Symbicort     GU PSH: None   NON-GU PSH: Back Surgery (Unspecified), 2003 Shoulder Arthroscopy/surgery, Right, 2019     GU PMH: None   NON-GU PMH: Asthma GERD Hypertension    FAMILY HISTORY: 1 son - Son Diabetes - Father, Grandmother Hypertension - Father pancreatic cancer - Grandmother   SOCIAL HISTORY: Marital Status: Married Preferred Language: English; Race: White Current Smoking Status: Patient has never smoked.   Tobacco Use Assessment Completed: Used Tobacco in last 30 days? Drinks 2 caffeinated drinks per day. Patient's occupation iTherapist, nutritional    REVIEW OF SYSTEMS:    GU Review Male:   Patient denies frequent urination, hard to postpone urination, burning/ pain with urination, get up at night to urinate, leakage of urine, stream starts and stops, trouble starting your stream, have to strain to urinate , erection problems, and penile pain.  Gastrointestinal (Upper):   Patient reports nausea and indigestion/ heartburn. Patient denies vomiting.  Gastrointestinal (Lower):    Patient denies diarrhea and constipation.  Constitutional:   Patient reports fatigue. Patient denies fever, night sweats, and weight loss.  Skin:   Patient denies skin rash/ lesion and itching.  Eyes:   Patient denies blurred vision and double vision.  Ears/ Nose/ Throat:   Patient denies sore throat and sinus problems.  Hematologic/Lymphatic:   Patient denies swollen glands and easy bruising.  Cardiovascular:   Patient denies leg swelling and chest pains.  Respiratory:   Patient denies shortness of breath and cough.  Endocrine:   Patient denies excessive thirst.  Musculoskeletal:   Patient reports back pain. Patient denies joint pain.  Neurological:   Patient denies headaches and dizziness.  Psychologic:   Patient denies depression and anxiety.   VITAL SIGNS:      04/16/2019 12:46 PM  Weight 302 lb / 136.98 kg  Height 68 in / 172.72 cm  BP 136/86 mmHg  Heart Rate 91 /min  Temperature 98.6 F / 37 C  BMI 45.9 kg/m   MULTI-SYSTEM PHYSICAL EXAMINATION:    Constitutional: Well-nourished. No physical deformities. Normally developed. Good grooming.  Neck: Neck symmetrical, not swollen. Normal tracheal position.  Respiratory: Normal breath sounds. No labored breathing, no use of accessory muscles.   Cardiovascular: Regular rate and rhythm. No murmur, no gallop. Normal temperature, normal extremity pulses, no swelling, no varicosities.   Lymphatic: No enlargement of neck, axillae, groin.  Skin: No paleness, no jaundice, no cyanosis. No lesion, no ulcer, no rash.  Neurologic / Psychiatric: Oriented to time, oriented to place, oriented to person.  No depression, no anxiety, no agitation.  Gastrointestinal: No mass, no tenderness, no rigidity, non obese abdomen.  Eyes: Normal conjunctivae. Normal eyelids.  Ears, Nose, Mouth, and Throat: Left ear no scars, no lesions, no masses. Right ear no scars, no lesions, no masses. Nose no scars, no lesions, no masses. Normal hearing. Normal lips.   Musculoskeletal: Normal gait and station of head and neck.     PAST DATA REVIEWED:  Source Of History:  Patient  Records Review:   Previous Patient Records, POC Tool  X-Ray Review: C.T. Abdomen/Pelvis: Reviewed Films. Discussed With Patient.     PROCEDURES:         KUB - [id number]  A single view of the abdomen is obtained. Renal shadows are easily visualized bilaterally. There are no stones appreciated within the expected location in either renal pelvis. There is a 5 mm calcification in in the proximal left ureter consistent with the patient's known stone. Gas pattern is grossly normal. No significant bony abnormalities.       Impression: The patient's stone is not moved since his CT scan 4 days prior. It remains in the proximal left ureter. Patient confirmed No Neulasta OnPro Device.           Urinalysis Dipstick Dipstick Cont'd  Color: Yellow Bilirubin: Neg mg/dL  Appearance: Clear Ketones: Neg mg/dL  Specific Gravity: 1.025 Blood: Neg ery/uL  pH: 6.0 Protein: Neg mg/dL  Glucose: Neg mg/dL Urobilinogen: 0.2 mg/dL    Nitrites: Neg    Leukocyte Esterase: Neg leu/uL    ASSESSMENT:      ICD-10 Details  1 GU:   Ureteral calculus - N20.1    PLAN:           Orders X-Rays: KUB          Schedule         Document Letter(s):  Created for Patient: Clinical Summary    We discussed management options including medical expulsion therapy, shockwave lithotripsy, and ureteroscopy. Ultimately, the patient has opted for shock wave lithotripsy. I discussed with the patient the procedure in detail as well as the risk and benefits. The patient is aware that she may need additional procedures. She also is aware of the risks of hematoma and pain. We will try to get this patient's scheduled as soon as possible.         Notes:   The patient has a 5 mm stone in the left proximal ureter. We discussed treatment options and ultimately he opted to proceed with shockwave lithotripsy. I told  him that he would have to stop his ibuprofen today. He can take tramadol which she already has at home. Will try to get this scheduled soon.

## 2019-04-18 NOTE — Interval H&P Note (Signed)
History and Physical Interval Note:  04/18/2019 10:59 AM  [name]  has presented today for surgery, with the diagnosis of LEFT URETERAL STONE.  The various methods of treatment have been discussed with the patient and family. After consideration of risks, benefits and other options for treatment, the patient has consented to  Procedure(s): LEFT URETEROSCOPY/HOLMIUM LASER/STENT PLACEMENT (Left) as a surgical intervention.  The patient's history has been reviewed, patient examined, no change in status, stable for surgery.  I have reviewed the patient's chart and labs.  Questions were answered to the patient's satisfaction.     Ardis Hughs

## 2019-04-18 NOTE — H&P (View-Only) (Signed)
Acute Kidney Stone  HPI: [name] is a 43 year-old male patient who was referred by [place] who is here for further eval and management of kidney stones.  He was diagnosed with a kidney stone on 04/13/2019. The patient presented to [facility] with symptoms of a kidney stone.   The pain is on the left side.   Abdomen/Pelvic CT: 04/13/19: 47m proximal left ureteral stone - no other calcifations. The patient underwent KUB prior to today's appointment.   The patient relates initially having nausea. He is currently having back pain. He denies having flank pain, groin pain, nausea, vomiting, fever, chills, and voiding symptoms. He has not caught a stone in his urine strainer since his symptoms began.   This is his first kidney stone.     ALLERGIES: None   MEDICATIONS: Lisinopril 10 mg tablet  Omeprazole 20 mg tablet, delayed release  Zyrtec 10 mg tablet  Proair Hfa  Symbicort     GU PSH: None   NON-GU PSH: Back Surgery (Unspecified), 2003 Shoulder Arthroscopy/surgery, Right, 2019     GU PMH: None   NON-GU PMH: Asthma GERD Hypertension    FAMILY HISTORY: 1 son - Son Diabetes - Father, Grandmother Hypertension - Father pancreatic cancer - Grandmother   SOCIAL HISTORY: Marital Status: Married Preferred Language: English; Race: White Current Smoking Status: Patient has never smoked.   Tobacco Use Assessment Completed: Used Tobacco in last 30 days? Drinks 2 caffeinated drinks per day. Patient's occupation iTherapist, nutritional    REVIEW OF SYSTEMS:    GU Review Male:   Patient denies frequent urination, hard to postpone urination, burning/ pain with urination, get up at night to urinate, leakage of urine, stream starts and stops, trouble starting your stream, have to strain to urinate , erection problems, and penile pain.  Gastrointestinal (Upper):   Patient reports nausea and indigestion/ heartburn. Patient denies vomiting.  Gastrointestinal (Lower):    Patient denies diarrhea and constipation.  Constitutional:   Patient reports fatigue. Patient denies fever, night sweats, and weight loss.  Skin:   Patient denies skin rash/ lesion and itching.  Eyes:   Patient denies blurred vision and double vision.  Ears/ Nose/ Throat:   Patient denies sore throat and sinus problems.  Hematologic/Lymphatic:   Patient denies swollen glands and easy bruising.  Cardiovascular:   Patient denies leg swelling and chest pains.  Respiratory:   Patient denies shortness of breath and cough.  Endocrine:   Patient denies excessive thirst.  Musculoskeletal:   Patient reports back pain. Patient denies joint pain.  Neurological:   Patient denies headaches and dizziness.  Psychologic:   Patient denies depression and anxiety.   VITAL SIGNS:      04/16/2019 12:46 PM  Weight 302 lb / 136.98 kg  Height 68 in / 172.72 cm  BP 136/86 mmHg  Heart Rate 91 /min  Temperature 98.6 F / 37 C  BMI 45.9 kg/m   MULTI-SYSTEM PHYSICAL EXAMINATION:    Constitutional: Well-nourished. No physical deformities. Normally developed. Good grooming.  Neck: Neck symmetrical, not swollen. Normal tracheal position.  Respiratory: Normal breath sounds. No labored breathing, no use of accessory muscles.   Cardiovascular: Regular rate and rhythm. No murmur, no gallop. Normal temperature, normal extremity pulses, no swelling, no varicosities.   Lymphatic: No enlargement of neck, axillae, groin.  Skin: No paleness, no jaundice, no cyanosis. No lesion, no ulcer, no rash.  Neurologic / Psychiatric: Oriented to time, oriented to place, oriented to person.  No depression, no anxiety, no agitation.  Gastrointestinal: No mass, no tenderness, no rigidity, non obese abdomen.  Eyes: Normal conjunctivae. Normal eyelids.  Ears, Nose, Mouth, and Throat: Left ear no scars, no lesions, no masses. Right ear no scars, no lesions, no masses. Nose no scars, no lesions, no masses. Normal hearing. Normal lips.   Musculoskeletal: Normal gait and station of head and neck.     PAST DATA REVIEWED:  Source Of History:  Patient  Records Review:   Previous Patient Records, POC Tool  X-Ray Review: C.T. Abdomen/Pelvis: Reviewed Films. Discussed With Patient.     PROCEDURES:         KUB - [id number]  A single view of the abdomen is obtained. Renal shadows are easily visualized bilaterally. There are no stones appreciated within the expected location in either renal pelvis. There is a 5 mm calcification in in the proximal left ureter consistent with the patient's known stone. Gas pattern is grossly normal. No significant bony abnormalities.       Impression: The patient's stone is not moved since his CT scan 4 days prior. It remains in the proximal left ureter. Patient confirmed No Neulasta OnPro Device.           Urinalysis Dipstick Dipstick Cont'd  Color: Yellow Bilirubin: Neg mg/dL  Appearance: Clear Ketones: Neg mg/dL  Specific Gravity: 1.025 Blood: Neg ery/uL  pH: 6.0 Protein: Neg mg/dL  Glucose: Neg mg/dL Urobilinogen: 0.2 mg/dL    Nitrites: Neg    Leukocyte Esterase: Neg leu/uL    ASSESSMENT:      ICD-10 Details  1 GU:   Ureteral calculus - N20.1    PLAN:           Orders X-Rays: KUB          Schedule         Document Letter(s):  Created for Patient: Clinical Summary    We discussed management options including medical expulsion therapy, shockwave lithotripsy, and ureteroscopy. Ultimately, the patient has opted for shock wave lithotripsy. I discussed with the patient the procedure in detail as well as the risk and benefits. The patient is aware that she may need additional procedures. She also is aware of the risks of hematoma and pain. We will try to get this patient's scheduled as soon as possible.         Notes:   The patient has a 5 mm stone in the left proximal ureter. We discussed treatment options and ultimately he opted to proceed with shockwave lithotripsy. I told  him that he would have to stop his ibuprofen today. He can take tramadol which she already has at home. Will try to get this scheduled soon.

## 2019-04-18 NOTE — Progress Notes (Signed)
Pt  States that tramadol doesn't work for him and Dr. Louis Meckel already paged back to ask for  Different prescription if possible. Patient states that Ibuprofen worked before and maybe it will work this time.  He says that he has tramadol at home that he will try again and if the Ibuprofen and tramadol doesn't work he will call Dr. Louis Meckel.

## 2019-04-19 ENCOUNTER — Other Ambulatory Visit: Payer: Self-pay | Admitting: Surgery

## 2019-04-19 ENCOUNTER — Encounter (HOSPITAL_BASED_OUTPATIENT_CLINIC_OR_DEPARTMENT_OTHER): Payer: Self-pay | Admitting: Urology

## 2019-04-19 LAB — URINE CULTURE: Culture: NO GROWTH

## 2019-04-22 ENCOUNTER — Other Ambulatory Visit: Payer: Self-pay | Admitting: Urology

## 2019-04-25 NOTE — Patient Instructions (Addendum)
DUE TO COVID-19 ONLY ONE VISITOR IS ALLOWED TO COME WITH YOU AND STAY IN THE WAITING ROOM ONLY DURING PRE OP AND PROCEDURE DAY OF SURGERY. THE 1 VISITOR MAY VISIT WITH YOU AFTER SURGERY IN YOUR PRIVATE ROOM DURING VISITING HOURS ONLY!  YOU NEED TO HAVE A COVID 19 TEST ON 9-21-2020_ @ 12:20 PM, THIS TEST MUST BE DONE BEFORE SURGERY, COME  [place], [place] , [address].  ([place]) ONCE YOUR COVID TEST IS COMPLETED, PLEASE BEGIN THE QUARANTINE INSTRUCTIONS AS OUTLINED IN YOUR HANDOUT.                TOMMEY BARRET   Your procedure is scheduled on: 05-02-2019   Report to [facility] Main  Entrance    Report to Admitting at 10:00AM     Call this number if you have problems the morning of surgery [phone]    Remember: Do not eat food or drink liquids :After Midnight.     Take these medicines the morning of surgery with A SIP OF WATER: TAMSULOSIN, ZYRTEC, OMEPRAZOLE, PROAIR INHALER, AND SYMBICORT INHALER   BRUSH YOUR TEETH MORNING OF SURGERY AND RINSE YOUR MOUTH OUT, NO CHEWING GUM CANDY OR MINTS.                                You may not have any metal on your body including hair pins and              piercings     Do not wear jewelry, cologne lotions, powders or deodorant                        Men may shave face and neck.   Do not bring valuables to the hospital. [place].  Contacts, dentures or bridgework may not be worn into surgery.     Patients discharged the day of surgery will not be allowed to drive home. IF YOU ARE HAVING SURGERY AND GOING HOME THE SAME DAY, YOU MUST HAVE AN ADULT TO DRIVE YOU HOME AND BE WITH YOU FOR 24 HOURS. YOU MAY GO HOME BY TAXI OR UBER OR ORTHERWISE, BUT AN ADULT MUST ACCOMPANY YOU HOME AND STAY WITH YOU FOR 24 HOURS.    Name and phone number of your driver: Malick Netz [phone]  Special Instructions: N/A              Please read over the following  fact sheets you were given: _____________________________________________________________________             [facility] - Preparing for Surgery Before surgery, you can play an important role.  Because skin is not sterile, your skin needs to be as free of germs as possible.  You can reduce the number of germs on your skin by washing with CHG (chlorahexidine gluconate) soap before surgery.  CHG is an antiseptic cleaner which kills germs and bonds with the skin to continue killing germs even after washing. Please DO NOT use if you have an allergy to CHG or antibacterial soaps.  If your skin becomes reddened/irritated stop using the CHG and inform your nurse when you arrive at Short Stay. Do not shave (including legs and underarms) for at least 48 hours prior to the first CHG shower.  You  may shave your face/neck. Please follow these instructions carefully:  1.  Shower with CHG Soap the night before surgery and the  morning of Surgery.  2.  If you choose to wash your hair, wash your hair first as usual with your  normal  shampoo.  3.  After you shampoo, rinse your hair and body thoroughly to remove the  shampoo.                           4.  Use CHG as you would any other liquid soap.  You can apply chg directly  to the skin and wash                       Gently with a scrungie or clean washcloth.  5.  Apply the CHG Soap to your body ONLY FROM THE NECK DOWN.   Do not use on face/ open                           Wound or open sores. Avoid contact with eyes, ears mouth and genitals (private parts).                       Wash face,  Genitals (private parts) with your normal soap.             6.  Wash thoroughly, paying special attention to the area where your surgery  will be performed.  7.  Thoroughly rinse your body with warm water from the neck down.  8.  DO NOT shower/wash with your normal soap after using and rinsing off  the CHG Soap.                9.  Pat yourself dry with a clean towel.             10.  Wear clean pajamas.            11.  Place clean sheets on your bed the night of your first shower and do not  sleep with pets. Day of Surgery : Do not apply any lotions/deodorants the morning of surgery.  Please wear clean clothes to the hospital/surgery center.  FAILURE TO FOLLOW THESE INSTRUCTIONS MAY RESULT IN THE CANCELLATION OF YOUR SURGERY PATIENT SIGNATURE_________________________________  NURSE SIGNATURE__________________________________  ________________________________________________________________________

## 2019-04-29 ENCOUNTER — Other Ambulatory Visit: Payer: Self-pay

## 2019-04-29 ENCOUNTER — Encounter (HOSPITAL_COMMUNITY): Payer: Self-pay

## 2019-04-29 ENCOUNTER — Other Ambulatory Visit (HOSPITAL_COMMUNITY)
Admission: RE | Admit: 2019-04-29 | Discharge: 2019-04-29 | Disposition: A | Discharge status: Home / Self Care | Payer: Managed Care, Other (non HMO) | Source: Ambulatory Visit | Attending: Urology | Admitting: Urology

## 2019-04-29 ENCOUNTER — Encounter (HOSPITAL_COMMUNITY)
Admission: RE | Admit: 2019-04-29 | Discharge: 2019-04-29 | Disposition: A | Discharge status: Home / Self Care | Payer: Managed Care, Other (non HMO) | Source: Ambulatory Visit | Attending: Urology | Admitting: Urology

## 2019-04-29 DIAGNOSIS — Z01812 Encounter for preprocedural laboratory examination: Secondary | ICD-10-CM | POA: Insufficient documentation

## 2019-04-29 DIAGNOSIS — Z20828 Contact with and (suspected) exposure to other viral communicable diseases: Secondary | ICD-10-CM | POA: Insufficient documentation

## 2019-04-29 DIAGNOSIS — N200 Calculus of kidney: Secondary | ICD-10-CM | POA: Diagnosis not present

## 2019-04-29 LAB — BASIC METABOLIC PANEL
Anion gap: 9 (ref 5–15)
BUN: 20 mg/dL (ref 6–20)
CO2: 25 mmol/L (ref 22–32)
Calcium: 9.3 mg/dL (ref 8.9–10.3)
Chloride: 106 mmol/L (ref 98–111)
Creatinine, Ser: 0.88 mg/dL (ref 0.61–1.24)
GFR calc Af Amer: >60 mL/min (ref >60)
GFR calc non Af Amer: >60 mL/min (ref >60)
Glucose, Bld: 94 mg/dL (ref 70–99)
Potassium: 3.8 mmol/L (ref 3.5–5.1)
Sodium: 140 mmol/L (ref 135–145)

## 2019-04-29 LAB — CBC
HCT: 45.6 % (ref 39.0–52.0)
Hemoglobin: 14.2 g/dL (ref 13.0–17.0)
MCH: 27.2 pg (ref 26.0–34.0)
MCHC: 31.1 g/dL (ref 30.0–36.0)
MCV: 87.4 fL (ref 80.0–100.0)
Platelets: 146 10*3/uL — ABNORMAL LOW (ref 150–400)
RBC: 5.22 MIL/uL (ref 4.22–5.81)
RDW: 12.4 % (ref 11.5–15.5)
WBC: 8.8 10*3/uL (ref 4.0–10.5)
nRBC: 0 % (ref 0.0–0.2)

## 2019-05-01 LAB — NOVEL CORONAVIRUS, NAA (HOSP ORDER, SEND-OUT TO REF LAB; TAT 18-24 HRS): SARS-CoV-2, NAA: NOT DETECTED

## 2019-05-01 MED ORDER — GENTAMICIN SULFATE 40 MG/ML IJ SOLN
5.0000 mg/kg | INTRAVENOUS | Status: AC
  Administered 2019-05-02: 480 mg via INTRAVENOUS
  Filled 2019-05-01: qty 12

## 2019-05-02 ENCOUNTER — Ambulatory Visit (HOSPITAL_COMMUNITY): Payer: Managed Care, Other (non HMO) | Admitting: Physician Assistant

## 2019-05-02 ENCOUNTER — Ambulatory Visit (HOSPITAL_COMMUNITY): Payer: Managed Care, Other (non HMO)

## 2019-05-02 ENCOUNTER — Encounter (HOSPITAL_COMMUNITY): Payer: Self-pay | Admitting: Anesthesiology

## 2019-05-02 ENCOUNTER — Ambulatory Visit (HOSPITAL_COMMUNITY)
Admission: RE | Admit: 2019-05-02 | Discharge: 2019-05-02 | Disposition: A | Discharge status: Home / Self Care | Payer: Managed Care, Other (non HMO) | Attending: Urology | Admitting: Urology

## 2019-05-02 ENCOUNTER — Encounter (HOSPITAL_COMMUNITY)
Admission: RE | Disposition: A | Discharge status: Home / Self Care | Payer: Self-pay | Source: Home / Self Care | Attending: Urology

## 2019-05-02 ENCOUNTER — Ambulatory Visit (HOSPITAL_COMMUNITY): Payer: Managed Care, Other (non HMO) | Admitting: Anesthesiology

## 2019-05-02 DIAGNOSIS — K219 Gastro-esophageal reflux disease without esophagitis: Secondary | ICD-10-CM | POA: Insufficient documentation

## 2019-05-02 DIAGNOSIS — Z8249 Family history of ischemic heart disease and other diseases of the circulatory system: Secondary | ICD-10-CM | POA: Diagnosis not present

## 2019-05-02 DIAGNOSIS — J45909 Unspecified asthma, uncomplicated: Secondary | ICD-10-CM | POA: Diagnosis not present

## 2019-05-02 DIAGNOSIS — Z79899 Other long term (current) drug therapy: Secondary | ICD-10-CM | POA: Diagnosis not present

## 2019-05-02 DIAGNOSIS — M1990 Unspecified osteoarthritis, unspecified site: Secondary | ICD-10-CM | POA: Diagnosis not present

## 2019-05-02 DIAGNOSIS — N200 Calculus of kidney: Secondary | ICD-10-CM

## 2019-05-02 DIAGNOSIS — Z7951 Long term (current) use of inhaled steroids: Secondary | ICD-10-CM | POA: Diagnosis not present

## 2019-05-02 DIAGNOSIS — N201 Calculus of ureter: Secondary | ICD-10-CM | POA: Insufficient documentation

## 2019-05-02 HISTORY — PX: CYSTOSCOPY/URETEROSCOPY/HOLMIUM LASER/STENT PLACEMENT: SHX6546

## 2019-05-02 SURGERY — CYSTOSCOPY/URETEROSCOPY/HOLMIUM LASER/STENT PLACEMENT
Anesthesia: General | Laterality: Left

## 2019-05-02 MED ORDER — LIDOCAINE HCL (CARDIAC) PF 100 MG/5ML IV SOSY
PREFILLED_SYRINGE | INTRAVENOUS | Status: DC | PRN
  Administered 2019-05-02: 100 mg via INTRAVENOUS

## 2019-05-02 MED ORDER — PROPOFOL 10 MG/ML IV BOLUS
INTRAVENOUS | Status: DC | PRN
  Administered 2019-05-02: 200 mg via INTRAVENOUS
  Administered 2019-05-02: 80 mg via INTRAVENOUS
  Administered 2019-05-02: 40 mg via INTRAVENOUS

## 2019-05-02 MED ORDER — SODIUM CHLORIDE 0.9 % IR SOLN
Status: DC | PRN
  Administered 2019-05-02: 3000 mL

## 2019-05-02 MED ORDER — MIDAZOLAM HCL 2 MG/2ML IJ SOLN
INTRAMUSCULAR | Status: AC
  Filled 2019-05-02: qty 2

## 2019-05-02 MED ORDER — CIPROFLOXACIN HCL 500 MG PO TABS: 500.0000 mg | ORAL_TABLET | Freq: Once | ORAL | 0 refills | Status: AC

## 2019-05-02 MED ORDER — FENTANYL CITRATE (PF) 100 MCG/2ML IJ SOLN
INTRAMUSCULAR | Status: AC
  Filled 2019-05-02: qty 2

## 2019-05-02 MED ORDER — MIDAZOLAM HCL 5 MG/5ML IJ SOLN
INTRAMUSCULAR | Status: DC | PRN
  Administered 2019-05-02: 2 mg via INTRAVENOUS

## 2019-05-02 MED ORDER — EPHEDRINE 5 MG/ML INJ
INTRAVENOUS | Status: AC
  Filled 2019-05-02: qty 10

## 2019-05-02 MED ORDER — LACTATED RINGERS IV SOLN
INTRAVENOUS | Status: DC
  Administered 2019-05-02 (×2): via INTRAVENOUS

## 2019-05-02 MED ORDER — ONDANSETRON HCL 4 MG/2ML IJ SOLN: 4.0000 mg | Freq: Four times a day (QID) | INTRAMUSCULAR | Status: DC | PRN

## 2019-05-02 MED ORDER — FENTANYL CITRATE (PF) 100 MCG/2ML IJ SOLN: 25.0000 ug | INTRAMUSCULAR | Status: DC | PRN

## 2019-05-02 MED ORDER — OXYCODONE HCL 5 MG/5ML PO SOLN: 5.0000 mg | Freq: Once | ORAL | Status: AC | PRN

## 2019-05-02 MED ORDER — ALBUMIN HUMAN 5 % IV SOLN
INTRAVENOUS | Status: AC
  Filled 2019-05-02: qty 500

## 2019-05-02 MED ORDER — DEXAMETHASONE SODIUM PHOSPHATE 4 MG/ML IJ SOLN
INTRAMUSCULAR | Status: DC | PRN
  Administered 2019-05-02: 10 mg via INTRAVENOUS

## 2019-05-02 MED ORDER — BELLADONNA ALKALOIDS-OPIUM 16.2-30 MG RE SUPP
RECTAL | Status: AC
  Filled 2019-05-02: qty 1

## 2019-05-02 MED ORDER — OXYCODONE HCL 5 MG PO TABS
5.0000 mg | ORAL_TABLET | Freq: Once | ORAL | Status: AC | PRN
  Administered 2019-05-02: 5 mg via ORAL

## 2019-05-02 MED ORDER — OXYCODONE HCL 5 MG PO TABS
ORAL_TABLET | ORAL | Status: AC
  Filled 2019-05-02: qty 1

## 2019-05-02 MED ORDER — EPHEDRINE SULFATE 50 MG/ML IJ SOLN
INTRAMUSCULAR | Status: DC | PRN
  Administered 2019-05-02 (×2): 10 mg via INTRAVENOUS
  Administered 2019-05-02: 7.5 mg via INTRAVENOUS

## 2019-05-02 MED ORDER — ONDANSETRON HCL 4 MG/2ML IJ SOLN
INTRAMUSCULAR | Status: DC | PRN
  Administered 2019-05-02: 4 mg via INTRAVENOUS

## 2019-05-02 MED ORDER — DEXMEDETOMIDINE HCL IN NACL 200 MCG/50ML IV SOLN
INTRAVENOUS | Status: DC | PRN
  Administered 2019-05-02: 8 ug via INTRAVENOUS

## 2019-05-02 MED ORDER — FENTANYL CITRATE (PF) 100 MCG/2ML IJ SOLN
INTRAMUSCULAR | Status: DC | PRN
  Administered 2019-05-02: 25 ug via INTRAVENOUS
  Administered 2019-05-02: 50 ug via INTRAVENOUS
  Administered 2019-05-02: 75 ug via INTRAVENOUS
  Administered 2019-05-02 (×2): 25 ug via INTRAVENOUS
  Administered 2019-05-02 (×2): 50 ug via INTRAVENOUS

## 2019-05-02 SURGICAL SUPPLY — 23 items
BAG URO CATCHER STRL LF (MISCELLANEOUS) ×2 IMPLANT
BALLN URETL DIL 5X10 (BALLOONS) ×1 IMPLANT
BASKET ZERO TIP NITINOL 2.4FR (BASKET) IMPLANT
BSKT STON RTRVL ZERO TP 2.4FR (BASKET)
CATH URET 5FR 28IN OPEN ENDED (CATHETERS) ×2 IMPLANT
CLOTH BEACON ORANGE TIMEOUT ST (SAFETY) ×2 IMPLANT
COVER WAND RF STERILE (DRAPES) IMPLANT
EXTRACTOR STONE 1.7FRX115CM (UROLOGICAL SUPPLIES) IMPLANT
EXTRACTOR STONE NITINOL NGAGE (UROLOGICAL SUPPLIES) ×1 IMPLANT
FIBER LASER TRAC TIP (UROLOGICAL SUPPLIES) ×1 IMPLANT
GLOVE BIOGEL M STRL SZ7.5 (GLOVE) ×2 IMPLANT
GOWN STRL REUS W/TWL XL LVL3 (GOWN DISPOSABLE) ×2 IMPLANT
GUIDEWIRE ANG ZIPWIRE 038X150 (WIRE) IMPLANT
GUIDEWIRE STR DUAL SENSOR (WIRE) ×3 IMPLANT
KIT TURNOVER KIT A (KITS) ×1 IMPLANT
MANIFOLD NEPTUNE II (INSTRUMENTS) ×2 IMPLANT
PACK CYSTO (CUSTOM PROCEDURE TRAY) ×2 IMPLANT
SHEATH URETERAL 12FRX28CM (UROLOGICAL SUPPLIES) IMPLANT
SHEATH URETERAL 12FRX35CM (MISCELLANEOUS) ×1 IMPLANT
STENT URET 6FRX26 CONTOUR (STENTS) ×1 IMPLANT
TUBING CONNECTING 10 (TUBING) ×2 IMPLANT
TUBING UROLOGY SET (TUBING) ×2 IMPLANT
WIRE COONS/BENSON .038X145CM (WIRE) IMPLANT

## 2019-05-02 NOTE — Interval H&P Note (Signed)
History and Physical Interval Note: Second stage ureteroscopy due to impassable ureter on initial attempt.  05/02/2019 12:01 PM  [name]  has presented today for surgery, with the diagnosis of LEFT KIDNEY STONE.  The various methods of treatment have been discussed with the patient and family. After consideration of risks, benefits and other options for treatment, the patient has consented to  Procedure(s): CYSTOSCOPY/URETEROSCOPY/HOLMIUM LASER/STENT EXCHANGE (Left) as a surgical intervention.  The patient's history has been reviewed, patient examined, no change in status, stable for surgery.  I have reviewed the patient's chart and labs.  Questions were answered to the patient's satisfaction.     Ardis Hughs

## 2019-05-02 NOTE — Anesthesia Preprocedure Evaluation (Signed)
Anesthesia Evaluation  Patient identified by MRN, date of birth, ID band Patient awake    Reviewed: Allergy & Precautions, H&P , NPO status , Patient's Chart, lab work & pertinent test results  Airway Mallampati: II   Neck ROM: full    Dental   Pulmonary asthma ,    breath sounds clear to auscultation       Cardiovascular hypertension,  Rhythm:regular Rate:Normal     Neuro/Psych  Headaches,  Neuromuscular disease    GI/Hepatic GERD  ,  Endo/Other    Renal/GU      Musculoskeletal  (+) Arthritis ,   Abdominal   Peds  Hematology   Anesthesia Other Findings   Reproductive/Obstetrics                             Anesthesia Physical Anesthesia Plan  ASA: II  Anesthesia Plan: General   Post-op Pain Management:    Induction: Intravenous  PONV Risk Score and Plan: 2 and Ondansetron, Dexamethasone, Midazolam and Treatment may vary due to age or medical condition  Airway Management Planned: LMA  Additional Equipment:   Intra-op Plan:   Post-operative Plan:   Informed Consent: I have reviewed the patients History and Physical, chart, labs and discussed the procedure including the risks, benefits and alternatives for the proposed anesthesia with the patient or authorized representative who has indicated his/her understanding and acceptance.       Plan Discussed with: CRNA, Anesthesiologist and Surgeon  Anesthesia Plan Comments:         Anesthesia Quick Evaluation

## 2019-05-02 NOTE — Transfer of Care (Signed)
Immediate Anesthesia Transfer of Care Note  Patient: [name]  Procedure(s) Performed: Procedure(s): CYSTOSCOPY/URETEROSCOPY/HOLMIUM LASER/STENT EXCHANGE (Left)  Patient Location: PACU  Anesthesia Type:General  Level of Consciousness:  sedated, patient cooperative and responds to stimulation  Airway & Oxygen Therapy:Patient Spontanous Breathing and Patient connected to face mask oxgen  Post-op Assessment:  Report given to PACU RN and Post -op Vital signs reviewed and stable  Post vital signs:  Reviewed and stable  Last Vitals:  Vitals:   05/02/19 1005 05/02/19 1315  BP: (!) 154/87 138/66  Pulse: (!) 59 (!) 101  Resp: 18 15  Temp: (!) 36.1 C 36.6 C  SpO2: 51% 83%    Complications: No apparent anesthesia complications

## 2019-05-02 NOTE — Anesthesia Procedure Notes (Signed)
Procedure Name: LMA Insertion Date/Time: 05/02/2019 12:22 PM Performed by: Lavina Hamman, CRNA Pre-anesthesia Checklist: Patient identified, Emergency Drugs available, Suction available and Patient being monitored Patient Re-evaluated:Patient Re-evaluated prior to induction Oxygen Delivery Method: Circle System Utilized Preoxygenation: Pre-oxygenation with 100% oxygen Induction Type: IV induction Ventilation: Mask ventilation without difficulty LMA: LMA inserted LMA Size: 4.0 Number of attempts: 1 Airway Equipment and Method: Bite block Placement Confirmation: positive ETCO2 Tube secured with: Tape Dental Injury: Teeth and Oropharynx as per pre-operative assessment

## 2019-05-02 NOTE — Discharge Instructions (Signed)
DISCHARGE INSTRUCTIONS FOR KIDNEY STONE/URETERAL STENT   MEDICATIONS:  1.  Resume all your other meds from home - except do not take any extra narcotic pain meds that you may have at home.  2. Pyridium is to help with the burning/stinging when you urinate. 3. Tramadol is for moderate/severe pain, otherwise taking upto 1000 mg every 6 hours of plainTylenol will help treat your pain.   4. Take Cipro one hour prior to removal of your stent.   ACTIVITY:  1. No strenuous activity x 1week  2. No driving while on narcotic pain medications  3. Drink plenty of water  4. Continue to walk at home - you can still get blood clots when you are at home, so keep active, but don't over do it.  5. May return to work/school tomorrow or when you feel ready   BATHING:  1. You can shower and we recommend daily showers  2. You have a string coming from your urethra: The stent string is attached to your ureteral stent. Do not pull on this.   SIGNS/SYMPTOMS TO CALL:  Please call us if you have a fever greater than 101.5, uncontrolled nausea/vomiting, uncontrolled pain, dizziness, unable to urinate, bloody urine, chest pain, shortness of breath, leg swelling, leg pain, redness around wound, drainage from wound, or any other concerns or questions.   You can reach [place] at [phone].   FOLLOW-UP:  1. You have an appointment in 6 weeks with a ultrasound of your kidneys prior.  2. You have a string attached to your stent, you may remove it on Monday, Sept 28th. To do this, pull the strings until the stents are completely removed. You may feel an odd sensation in your back.     General Anesthesia, Adult, Care After This sheet gives you information about how to care for yourself after your procedure. Your health care provider may also give you more specific instructions. If you have problems or questions, contact your health care provider. What can I expect after the procedure? After the procedure, the following  side effects are common:  Pain or discomfort at the IV site.  Nausea.  Vomiting.  Sore throat.  Trouble concentrating.  Feeling cold or chills.  Weak or tired.  Sleepiness and fatigue.  Soreness and body aches. These side effects can affect parts of the body that were not involved in surgery. Follow these instructions at home:  For at least 24 hours after the procedure:  Have a responsible adult stay with you. It is important to have someone help care for you until you are awake and alert.  Rest as needed.  Do not: ? Participate in activities in which you could fall or become injured. ? Drive. ? Use heavy machinery. ? Drink alcohol. ? Take sleeping pills or medicines that cause drowsiness. ? Make important decisions or sign legal documents. ? Take care of children on your own. Eating and drinking  Follow any instructions from your health care provider about eating or drinking restrictions.  When you feel hungry, start by eating small amounts of foods that are soft and easy to digest (bland), such as toast. Gradually return to your regular diet.  Drink enough fluid to keep your urine pale yellow.  If you vomit, rehydrate by drinking water, juice, or clear broth. General instructions  If you have sleep apnea, surgery and certain medicines can increase your risk for breathing problems. Follow instructions from your health care provider about wearing your sleep device: ?  Anytime you are sleeping, including during daytime naps. ? While taking prescription pain medicines, sleeping medicines, or medicines that make you drowsy.  Return to your normal activities as told by your health care provider. Ask your health care provider what activities are safe for you.  Take over-the-counter and prescription medicines only as told by your health care provider.  If you smoke, do not smoke without supervision.  Keep all follow-up visits as told by your health care provider. This  is important. Contact a health care provider if:  You have nausea or vomiting that does not get better with medicine.  You cannot eat or drink without vomiting.  You have pain that does not get better with medicine.  You are unable to pass urine.  You develop a skin rash.  You have a fever.  You have redness around your IV site that gets worse. Get help right away if:  You have difficulty breathing.  You have chest pain.  You have blood in your urine or stool, or you vomit blood. Summary  After the procedure, it is common to have a sore throat or nausea. It is also common to feel tired.  Have a responsible adult stay with you for the first 24 hours after general anesthesia. It is important to have someone help care for you until you are awake and alert.  When you feel hungry, start by eating small amounts of foods that are soft and easy to digest (bland), such as toast. Gradually return to your regular diet.  Drink enough fluid to keep your urine pale yellow.  Return to your normal activities as told by your health care provider. Ask your health care provider what activities are safe for you. This information is not intended to replace advice given to you by your health care provider. Make sure you discuss any questions you have with your health care provider. Document Released: 10/31/2000 Document Revised: 07/28/2017 Document Reviewed: 03/10/2017 Elsevier Patient Education  2020 [employer].

## 2019-05-02 NOTE — Op Note (Signed)
Preoperative diagnosis: left ureteral calculus  Postoperative diagnosis: left ureteral calculus  Procedure:  1. Cystoscopy 2. Balloon dilation of the left distal ureter 3. left ureteroscopy and stone removal 4. Ureteroscopic laser lithotripsy 5. left 19F x 26cm ureteral stent placement  6. left retrograde pyelography with interpretation  Surgeon: Ardis Hughs, MD  Anesthesia: General  Complications: None  Intraoperative findings:  #1. the patient had a very tight ureteral orifice and distal ureter requiring balloon dilation. #2. the patient stone was located up in the lower pole of the kidney which was fragmented into 4 pieces and then removed.    EBL: Minimal  Specimens: 1. left ureteral calculus  Disposition of specimens: [facility] for stone analysis  Indication: [name] is a 43 y.o.   patient with a left ureteral stone and associated left symptoms. After reviewing the management options for treatment, the patient elected to proceed with the above surgical procedure(s). We have discussed the potential benefits and risks of the procedure, side effects of the proposed treatment, the likelihood of the patient achieving the goals of the procedure, and any potential problems that might occur during the procedure or recuperation. Informed consent has been obtained.   Description of procedure:  The patient was taken to the operating room and general anesthesia was induced.  The patient was placed in the dorsal lithotomy position, prepped and draped in the usual sterile fashion, and preoperative antibiotics were administered. A preoperative time-out was performed.   Cystourethroscopy was performed.  The patient's urethra was examined and was normal, demonstrated bilobar prostatic hypertrophy. The bladder was then systematically examined in its entirety. There was no evidence for any bladder tumors, stones, or other mucosal pathology.    The stent  emanating from patient's left ureteral orifice was grasped with a grasper and pulled to the urethral meatus.  The advanced a 0.38 sensor wire through this stent and up into the left renal kidney removing the stent over the wire.  I then advanced a flexible ureteroscope through the urethra and into the left ureteral orifice, but I was unable to advance beyond the left orifice.  As observed of the scope and attempted to pass scope over a second wire.  This was unsuccessful.  I left the wire behind and then dilated it using a 10 cm x 15 French ureteral balloon dilator.  Once the distal ureter was completely dilated I was able to advance an access sheath, 12/14 [place] x35 cm into the patient's mid ureter.  I remove the inner portion of the access sheath and the wire.  I then advanced the flexible ureteroscope through the access sheath and into the left renal pelvis.  I encountered the stone in the lower pole and moved into the upper pole with a engage basket.  I then fragmented the stone into 4 pieces using the holmium laser 200 m fiber with settings of 0.6 J and 6 Hz.  Once the stone had been fragmented I used the engage basket to remove the fragments.    I then slowly backed out the ureteroscope after injecting contrast into the patient was on renal pelvis. There were no additional filling defects within the renal pelvis or collecting system, and the ureter appeared to be in good condition with no significant significant trauma.  The wire was then backloaded through the cystoscope and a ureteral stent was advance over the wire using Seldinger technique.  The stent was positioned appropriately under fluoroscopic and cystoscopic guidance.  The  wire was then removed with an adequate stent curl noted in the renal pelvis as well as in the bladder.  The bladder was then emptied and the procedure ended.  The patient appeared to tolerate the procedure well and without complications.  The patient was able to be awakened  and transferred to the recovery unit in satisfactory condition.   Disposition: The tether of the stent was left on and secured to the ventral aspect of the patient's penis.  Instructions for removing the stent have been provided to the patient. The patient has been scheduled for followup in 6 weeks with a renal ultrasound.

## 2019-05-03 NOTE — Anesthesia Postprocedure Evaluation (Signed)
Anesthesia Post Note  Patient: [name]  Procedure(s) Performed: CYSTOSCOPY/URETEROSCOPY/HOLMIUM LASER/STENT EXCHANGE (Left )     Patient location during evaluation: PACU Anesthesia Type: General Level of consciousness: awake and alert Pain management: pain level controlled Vital Signs Assessment: post-procedure vital signs reviewed and stable Respiratory status: spontaneous breathing, nonlabored ventilation, respiratory function stable and patient connected to nasal cannula oxygen Cardiovascular status: blood pressure returned to baseline and stable Postop Assessment: no apparent nausea or vomiting Anesthetic complications: no    Last Vitals:  Vitals:   05/02/19 1417 05/02/19 1436  BP: 131/71 135/84  Pulse: 98 96  Resp: 17 15  Temp: 36.4 C 36.4 C  SpO2: 95% 94%    Last Pain:  Vitals:   05/03/19 1527  TempSrc:   PainSc: 2                  [name] S

## 2019-05-04 ENCOUNTER — Encounter (HOSPITAL_COMMUNITY): Payer: Self-pay | Admitting: Urology

## 2019-05-14 ENCOUNTER — Encounter (HOSPITAL_COMMUNITY): Payer: Self-pay | Admitting: Urology

## 2019-05-28 ENCOUNTER — Other Ambulatory Visit: Payer: Self-pay

## 2019-05-28 ENCOUNTER — Ambulatory Visit: Payer: Managed Care, Other (non HMO) | Admitting: Allergy & Immunology

## 2019-05-28 ENCOUNTER — Encounter: Payer: Self-pay | Admitting: Allergy & Immunology

## 2019-05-28 VITALS — BP 138/80 | HR 81 | Temp 98.2°F | Resp 18

## 2019-05-28 DIAGNOSIS — J3089 Other allergic rhinitis: Secondary | ICD-10-CM | POA: Diagnosis not present

## 2019-05-28 DIAGNOSIS — J4540 Moderate persistent asthma, uncomplicated: Secondary | ICD-10-CM

## 2019-05-28 MED ORDER — BUDESONIDE-FORMOTEROL FUMARATE 160-4.5 MCG/ACT IN AERO: 2.0000 | INHALATION_SPRAY | Freq: Every day | RESPIRATORY_TRACT | 5 refills | Status: DC | PRN

## 2019-05-28 MED ORDER — PROAIR RESPICLICK 108 (90 BASE) MCG/ACT IN AEPB: 2.0000 | INHALATION_SPRAY | RESPIRATORY_TRACT | 1 refills | Status: DC | PRN

## 2019-05-28 NOTE — Patient Instructions (Addendum)
1. Moderate persistent asthma, uncomplicated - Lung testing looked awesome today. - We will not make any medication changes at this time. - Daily controller medication(s): Symbicort 160/4.72mg two puffs twice daily with spacer - Prior to physical activity: albuterol 2 puffs 10-15 minutes before physical activity. - Rescue medications: albuterol 4 puffs every 4-6 hours as needed - Asthma control goals:  * Full participation in all desired activities (may need albuterol before activity) * Albuterol use two time or less a week on average (not counting use with activity) * Cough interfering with sleep two time or less a month * Oral steroids no more than once a year * No hospitalizations  2. Allergic rhinitis - Continue with Nasacort one spray per nostril daily. - Continue with cetirizine 133mdaily.  3. Return in about 6 months (around 11/26/2019). This can be an in-person, a virtual Webex or a telephone follow up visit.   Please inform usKoreaf any Emergency Department visits, hospitalizations, or changes in symptoms. Call usKoreaefore going to the ED for breathing or allergy symptoms since we might be able to fit you in for a sick visit. Feel free to contact usKoreanytime with any questions, problems, or concerns.  It was a pleasure to meet you today!  Websites that have reliable patient information: 1. American Academy of Asthma, Allergy, and Immunology: www.aaaai.org 2. Food Allergy Research and Education (FARE): foodallergy.org 3. Mothers of Asthmatics: http://www.asthmacommunitynetwork.org 4. American College of Allergy, Asthma, and Immunology: www.acaai.org  "Like" usK[facility] Facebook and Instagram for our latest updates!      Make sure you are registered to vote! If you have moved or changed any of your contact information, you will need to get this updated before voting!  In some cases, you MAY be able to register to vote online: [email or web address]   Voter ID laws are NOT going into effect for the General Election in November 2020! DO NOT let this stop you from exercising your right to vote!   Absentee voting is the SAFEST way to vote during the coronavirus pandemic!   Download and print an absentee ballot request form at [email or web address] or you can scan the QR code below with your smart phone:      More information on absentee ballots can be found here: [email or web address]

## 2019-05-28 NOTE — Progress Notes (Signed)
FOLLOW UP  Date of Service/Encounter:  05/28/19   Assessment:   Moderate persistent asthma, uncomplicated  Allergic rhinitis  Plan/Recommendations:   1. Moderate persistent asthma, uncomplicated - Lung testing looked awesome today. - We will not make any medication changes at this time. - Daily controller medication(s): Symbicort 160/4.60mg two puffs twice daily with spacer - Prior to physical activity: albuterol 2 puffs 10-15 minutes before physical activity. - Rescue medications: albuterol 4 puffs every 4-6 hours as needed - Asthma control goals:  * Full participation in all desired activities (may need albuterol before activity) * Albuterol use two time or less a week on average (not counting use with activity) * Cough interfering with sleep two time or less a month * Oral steroids no more than once a year * No hospitalizations  2. Allergic rhinitis - Continue with Nasacort one spray per nostril daily. - Continue with cetirizine 171mdaily.  3. Return in about 6 months (around 11/26/2019). This can be an in-person, a virtual Webex or a telephone follow up visit.   Subjective:   ToARLOW SPIERSs a [age].o. male presenting today for follow up of  Chief Complaint  Patient presents with  . Follow-up  . Medication Refill    ToGHASSAN COGGESHALLas a history of the following: Patient Active Problem List   Diagnosis Date Noted  . Fever   . Gram positive bacterial infection   . Septic arthritis of right acromioclavicular joint ([place]/22/2019  . Chronic left-sided low back pain with left-sided sciatica 09/19/2017  . Epigastric pain   . Pancreatitis 04/17/2017  . Essential hypertension 04/17/2017  . Moderate persistent asthma 07/21/2015  . Allergic rhinoconjunctivitis 07/21/2015  . Esophageal reflux 07/21/2015  . ABNORMALITY OF GAIT 11/12/2009  . TARSAL TUNNEL SYNDROME, LEFT 10/08/2009  . PES PLANUS 10/08/2009    History obtained from: chart review and patient.   ToEinars a [age].o. male presenting for a follow up visit. He was last seen in July 2019 by Dr. KoNeldon McAt that time, he was continued on Symbicort 160/4.5 mcg two puffs twice daily. We continued him on Rhinocort or Nasacort one spray per nostril daily. We also continued with omeprazole 2045mID.   Since the last visit, he has done well.  Asthma/Respiratory Symptom History: He remains on the Symbicort two puffs in the morning. He does not use it at night regularly. There is a new indoor dog. This did not seem to make his symptoms worse at all. He is not using it any more than just at night. He is open to changing the albuterol to as needed and using the Symbicort two puffs twice daily as prescribed. He just reaches for the albuterol because "it is closer" when he goes to bed. Gaylord's asthma has been well controlled. He has not required rescue medication, experienced nocturnal awakenings due to lower respiratory symptoms, nor have activities of daily living been limited. He has required no Emergency Department or Urgent Care visits for his asthma. He has required zero courses of systemic steroids for asthma exacerbations since the last visit. ACT score today is 21, indicating excellent asthma symptom control.   Allergic Rhinitis Symptom History: He remains on the nasal steroid daily. He is using cetirizine one tablet daily. He does not remember the last time that he needed any antibiotics whatsoever. Overall symptoms ar ewell controlled.  Otherwise, there have been no changes to his past medical history, surgical history, family history, or social history.  Review of Systems  Constitutional: Negative for chills, diaphoresis, fever, malaise/fatigue and weight loss.  HENT: Negative.  Negative for congestion, ear discharge and ear pain.   Eyes: Negative for pain, discharge and redness.  Respiratory: Negative for cough, sputum production, shortness of breath and wheezing.   Cardiovascular: Negative.   Negative for chest pain and palpitations.  Gastrointestinal: Negative for abdominal pain, constipation, diarrhea, heartburn, nausea and vomiting.  Skin: Negative.  Negative for itching and rash.  Neurological: Negative for dizziness and headaches.  Endo/Heme/Allergies: Negative for environmental allergies. Does not bruise/bleed easily.       Objective:   Blood pressure 138/80, pulse 81, temperature 98.2 F (36.8 C), temperature source Temporal, resp. rate 18, SpO2 94 %. There is no height or weight on file to calculate BMI.   Physical Exam:  Physical Exam  Constitutional: He appears well-developed.  Obese male. Pleasant.   HENT:  Head: Normocephalic and atraumatic.  Right Ear: Tympanic membrane, external ear and ear canal normal. No drainage, swelling or tenderness. Tympanic membrane is not injected, not scarred, not erythematous, not retracted and not bulging.  Left Ear: Tympanic membrane, external ear and ear canal normal. No drainage, swelling or tenderness. Tympanic membrane is not injected, not scarred, not erythematous, not retracted and not bulging.  Nose: Mucosal edema and rhinorrhea present. No nasal deformity or septal deviation. No epistaxis. Right sinus exhibits no maxillary sinus tenderness and no frontal sinus tenderness. Left sinus exhibits no maxillary sinus tenderness and no frontal sinus tenderness.  Mouth/Throat: Uvula is midline and oropharynx is clear and moist. Mucous membranes are not pale and not dry.  Cobblestoning present in the posterior oropharynx.  Eyes: Pupils are equal, round, and reactive to light. Conjunctivae and EOM are normal. Right eye exhibits no chemosis and no discharge. Left eye exhibits no chemosis and no discharge. Right conjunctiva is not injected. Left conjunctiva is not injected.  Cardiovascular: Normal rate, regular rhythm and normal heart sounds.  Respiratory: Effort normal and breath sounds normal. No accessory muscle usage. No tachypnea.  No respiratory distress. He has no wheezes. He has no rhonchi. He has no rales. He exhibits no tenderness.  Moving air well in the upper lung fields. Decreased air movement at the bases, likely secondary to body habitus.   GI: There is no abdominal tenderness. There is no rebound and no guarding.  Lymphadenopathy:       Head (right side): No submandibular, no tonsillar and no occipital adenopathy present.       Head (left side): No submandibular, no tonsillar and no occipital adenopathy present.    He has no cervical adenopathy.  Neurological: He is alert.  Skin: No abrasion, no petechiae and no rash noted. Rash is not papular, not vesicular and not urticarial. No erythema. No pallor.  No eczematous or urticarial lesions noted.   Psychiatric: He has a normal mood and affect.     Diagnostic studies:    Spirometry: results normal (FEV1: 3.60/98%, FVC: 4.67/100%, FEV1/FVC: 77%).    Spirometry consistent with normal pattern.   Allergy Studies: none      Salvatore Marvel, MD  [facility] [place] [facility] [place]

## 2019-07-09 DIAGNOSIS — J189 Pneumonia, unspecified organism: Secondary | ICD-10-CM | POA: Insufficient documentation

## 2019-07-09 HISTORY — DX: Pneumonia, unspecified organism: J18.9

## 2019-07-16 ENCOUNTER — Encounter: Payer: Self-pay | Admitting: Allergy & Immunology

## 2019-07-16 ENCOUNTER — Ambulatory Visit (INDEPENDENT_AMBULATORY_CARE_PROVIDER_SITE_OTHER): Payer: Managed Care, Other (non HMO) | Admitting: Allergy & Immunology

## 2019-07-16 ENCOUNTER — Other Ambulatory Visit: Payer: Self-pay

## 2019-07-16 ENCOUNTER — Telehealth: Payer: Self-pay

## 2019-07-16 DIAGNOSIS — J4540 Moderate persistent asthma, uncomplicated: Secondary | ICD-10-CM | POA: Diagnosis not present

## 2019-07-16 DIAGNOSIS — J3089 Other allergic rhinitis: Secondary | ICD-10-CM | POA: Diagnosis not present

## 2019-07-16 DIAGNOSIS — J0100 Acute maxillary sinusitis, unspecified: Secondary | ICD-10-CM | POA: Diagnosis not present

## 2019-07-16 MED ORDER — PREDNISONE 10 MG PO TABS: ORAL_TABLET | ORAL | 0 refills | Status: DC

## 2019-07-16 NOTE — Progress Notes (Signed)
RE: [name] MRN: [id number] DOB: July 18, 1976 Date of Telemedicine Visit: 07/16/2019  Referring provider: Tamsen Roers, MD Primary care provider: Tamsen Roers, MD  Chief Complaint: Cough (yesterday) and Sinus Problem (started yesterday)   Telemedicine Follow Up Visit via Telephone: I connected with [name] for a follow up on 07/16/19 by telephone and verified that I am speaking with the correct person using two identifiers.   I discussed the limitations, risks, security and privacy concerns of performing an evaluation and management service by telephone and the availability of in person appointments. I also discussed with the patient that there may be a patient responsible charge related to this service. The patient expressed understanding and agreed to proceed.  Patient is at home accompanied by his wife who provided/contributed to the history.  Provider is at the office.  Visit start time: 2:08 PM Visit end time: 2:23 PM Insurance consent/check in by: Garlon Hatchet Medical consent and medical assistant/nurse: Garlon Hatchet  History of Present Illness:  He is a 43 y.o. male, who is being followed for moderate persistent asthma and allergic rhinitis. His previous allergy office visit was in October 2020 with myself.  He was last seen in October 2020.  At that time, his lung testing looked excellent.  We continued with Symbicort 160/4.5 mcg 2 puffs twice daily and albuterol as needed.  For his allergic rhinitis, we continue with Nasacort and cetirizine.  We recommend he come back in 6 months for follow-up visit.  In the interim, he has developed some problems with congestion and drainage. He has had clear drainage. Symptoms started yesterday. It seems that they started yesterday morning. He has tried using any OTC medications at this time. He has a yellow tinge to his symptoms. He denies any sick contacts. He did have some sneezing last night which has not been too bad. He really does not  remember the last time that he was sick like this. He works at [employer] in [name]. There have been some positive coworkers but they were in a different department. He did have some nausea this morning but he did not have any emesis. He did have some loose bowels around one hour ago. He denies bloody diarrhea.   Asthma/Respiratory Symptom History: He remains on his Symbicort. He is trying to take it twice daily but he at least gets it in at least once daily.  Tyaire's asthma has been well controlled. He has not required rescue medication, experienced nocturnal awakenings due to lower respiratory symptoms, nor have activities of daily living been limited. He has required no Emergency Department or Urgent Care visits for his asthma. He has required zero courses of systemic steroids for asthma exacerbations since the last visit. ACT score today is 23, indicating excellent asthma symptom control.   Otherwise, there have been no changes to his past medical history, surgical history, family history, or social history.  Assessment and Plan:  [name] is a 43 y.o. male with:   Moderate persistent asthma, uncomplicated  Allergic rhinitis  Acute sinusitis   [name] presents for a sick visit.  He reports 24 to 48 hours of congestion, cough, sinus pressure, and clear to yellow discharge.  He has been afebrile and denies sick contacts.  Given the short duration of symptoms, I think we can just treat with a prednisone burst and Mucinex with increased hydration.  I did ask him to call us back on Thursday if there was no improvement and we could certainly  add on antibiotics to this regimen.  However, with the time course, I think the most likely etiology is viral.  He is having no fever and has had no known sick contacts, including with COVID-19, therefore I do not think testing is indicated at this time.  We will continue to consider this in the future, however.  There are no Patient  Instructions on file for this visit.  Diagnostics: None.  Medication List:  Current Outpatient Medications  Medication Sig Dispense Refill  . Albuterol Sulfate (PROAIR RESPICLICK) 202 (90 Base) MCG/ACT AEPB Inhale 2 puffs into the lungs every 4 (four) hours as needed (Shortness of breath). 1 each 1  . budesonide-formoterol (SYMBICORT) 160-4.5 MCG/ACT inhaler Inhale 2 puffs into the lungs daily as needed (cough or wheeze). Inhale two puffs ONCE daily to prevent cough or wheeze. Rinse, gargle, and spit after use. 10.2 g 5  . cetirizine (ZYRTEC) 10 MG tablet Take 1 tablet (10 mg total) by mouth daily. 30 tablet 5  . ibuprofen (ADVIL) 200 MG tablet Take 600 mg by mouth every 6 (six) hours as needed for headache or mild pain.    Marland Kitchen lisinopril-hydrochlorothiazide (ZESTORETIC) 10-12.5 MG tablet Take 1 tablet by mouth daily.    Marland Kitchen omeprazole (PRILOSEC) 20 MG capsule Take 40 mg by mouth daily.     Marland Kitchen triamcinolone (NASACORT ALLERGY 24HR) 55 MCG/ACT AERO nasal inhaler Place 2 sprays into the nose daily. (Patient taking differently: Place 2 sprays into the nose daily as needed (allergies). ) 1 Inhaler 5   No current facility-administered medications for this visit.    Allergies: No Known Allergies I reviewed his past medical history, social history, family history, and environmental history and no significant changes have been reported from previous visits.  Review of Systems  Constitutional: Negative for chills, diaphoresis, fatigue and fever.  HENT: Positive for congestion, postnasal drip, rhinorrhea, sinus pressure, sneezing and sore throat. Negative for ear discharge, ear pain, facial swelling and sinus pain.   Eyes: Negative for pain, discharge and itching.  Respiratory: Negative for apnea, cough, chest tightness and shortness of breath.   Cardiovascular: Negative for chest pain.  Gastrointestinal: Negative for diarrhea and nausea.  Endocrine: Negative for cold intolerance and heat intolerance.   Musculoskeletal: Negative for arthralgias and myalgias.  Skin: Negative for rash.  Allergic/Immunologic: Negative for environmental allergies, food allergies and immunocompromised state.    Objective:  Physical exam not obtained as encounter was done via telephone.   Previous notes and tests were reviewed.  I discussed the assessment and treatment plan with the patient. The patient was provided an opportunity to ask questions and all were answered. The patient agreed with the plan and demonstrated an understanding of the instructions.   The patient was advised to call back or seek an in-person evaluation if the symptoms worsen or if the condition fails to improve as anticipated.  I provided 15 minutes of non-face-to-face time during this encounter.  It was my pleasure to participate in Cline Draheim care today. Please feel free to contact me with any questions or concerns.   Sincerely,  Valentina Shaggy, MD

## 2019-07-16 NOTE — Telephone Encounter (Signed)
ERROR

## 2019-07-18 ENCOUNTER — Telehealth: Payer: Self-pay

## 2019-07-18 NOTE — Telephone Encounter (Signed)
Left a message for pt to return call

## 2019-07-18 NOTE — Telephone Encounter (Signed)
Valentina Shaggy, MD  P Aac Follow Up  Can we call this patient on Thursday December 10th to see how he is doing? Thank you!  Lm for pt to call us back

## 2019-07-19 ENCOUNTER — Other Ambulatory Visit: Payer: Self-pay

## 2019-07-19 DIAGNOSIS — Z20822 Contact with and (suspected) exposure to covid-19: Secondary | ICD-10-CM

## 2019-07-21 LAB — NOVEL CORONAVIRUS, NAA: SARS-CoV-2, NAA: DETECTED — AB

## 2019-07-22 ENCOUNTER — Emergency Department (HOSPITAL_COMMUNITY): Payer: Managed Care, Other (non HMO)

## 2019-07-22 ENCOUNTER — Inpatient Hospital Stay (HOSPITAL_COMMUNITY)
Admission: EM | Admit: 2019-07-22 | Discharge: 2019-11-14 | DRG: 003 | Disposition: A | Discharge status: Rehabilitation Facility | Payer: Managed Care, Other (non HMO) | Attending: Internal Medicine | Admitting: Internal Medicine

## 2019-07-22 ENCOUNTER — Encounter (HOSPITAL_COMMUNITY): Payer: Self-pay | Admitting: Emergency Medicine

## 2019-07-22 DIAGNOSIS — I081 Rheumatic disorders of both mitral and tricuspid valves: Secondary | ICD-10-CM | POA: Diagnosis present

## 2019-07-22 DIAGNOSIS — R0902 Hypoxemia: Secondary | ICD-10-CM

## 2019-07-22 DIAGNOSIS — R0603 Acute respiratory distress: Secondary | ICD-10-CM | POA: Diagnosis not present

## 2019-07-22 DIAGNOSIS — M542 Cervicalgia: Secondary | ICD-10-CM | POA: Diagnosis not present

## 2019-07-22 DIAGNOSIS — E440 Moderate protein-calorie malnutrition: Secondary | ICD-10-CM | POA: Diagnosis not present

## 2019-07-22 DIAGNOSIS — D72819 Decreased white blood cell count, unspecified: Secondary | ICD-10-CM | POA: Diagnosis not present

## 2019-07-22 DIAGNOSIS — Z930 Tracheostomy status: Secondary | ICD-10-CM | POA: Diagnosis not present

## 2019-07-22 DIAGNOSIS — J156 Pneumonia due to other aerobic Gram-negative bacteria: Secondary | ICD-10-CM | POA: Diagnosis not present

## 2019-07-22 DIAGNOSIS — L080 Pyoderma: Secondary | ICD-10-CM | POA: Diagnosis not present

## 2019-07-22 DIAGNOSIS — K219 Gastro-esophageal reflux disease without esophagitis: Secondary | ICD-10-CM | POA: Diagnosis present

## 2019-07-22 DIAGNOSIS — I10 Essential (primary) hypertension: Secondary | ICD-10-CM | POA: Diagnosis present

## 2019-07-22 DIAGNOSIS — J189 Pneumonia, unspecified organism: Secondary | ICD-10-CM | POA: Diagnosis not present

## 2019-07-22 DIAGNOSIS — Z978 Presence of other specified devices: Secondary | ICD-10-CM

## 2019-07-22 DIAGNOSIS — Z9689 Presence of other specified functional implants: Secondary | ICD-10-CM

## 2019-07-22 DIAGNOSIS — Z8249 Family history of ischemic heart disease and other diseases of the circulatory system: Secondary | ICD-10-CM

## 2019-07-22 DIAGNOSIS — E1165 Type 2 diabetes mellitus with hyperglycemia: Secondary | ICD-10-CM | POA: Diagnosis present

## 2019-07-22 DIAGNOSIS — Z6841 Body Mass Index (BMI) 40.0 and over, adult: Secondary | ICD-10-CM

## 2019-07-22 DIAGNOSIS — E43 Unspecified severe protein-calorie malnutrition: Secondary | ICD-10-CM | POA: Diagnosis not present

## 2019-07-22 DIAGNOSIS — Z515 Encounter for palliative care: Secondary | ICD-10-CM | POA: Diagnosis not present

## 2019-07-22 DIAGNOSIS — Y838 Other surgical procedures as the cause of abnormal reaction of the patient, or of later complication, without mention of misadventure at the time of the procedure: Secondary | ICD-10-CM | POA: Diagnosis not present

## 2019-07-22 DIAGNOSIS — Z7189 Other specified counseling: Secondary | ICD-10-CM | POA: Diagnosis not present

## 2019-07-22 DIAGNOSIS — A4153 Sepsis due to Serratia: Secondary | ICD-10-CM | POA: Diagnosis not present

## 2019-07-22 DIAGNOSIS — R1314 Dysphagia, pharyngoesophageal phase: Secondary | ICD-10-CM | POA: Diagnosis not present

## 2019-07-22 DIAGNOSIS — Z9889 Other specified postprocedural states: Secondary | ICD-10-CM

## 2019-07-22 DIAGNOSIS — R092 Respiratory arrest: Secondary | ICD-10-CM

## 2019-07-22 DIAGNOSIS — T17590A Other foreign object in bronchus causing asphyxiation, initial encounter: Secondary | ICD-10-CM | POA: Diagnosis not present

## 2019-07-22 DIAGNOSIS — T45515A Adverse effect of anticoagulants, initial encounter: Secondary | ICD-10-CM | POA: Diagnosis not present

## 2019-07-22 DIAGNOSIS — G8250 Quadriplegia, unspecified: Secondary | ICD-10-CM | POA: Diagnosis not present

## 2019-07-22 DIAGNOSIS — R234 Changes in skin texture: Secondary | ICD-10-CM | POA: Diagnosis not present

## 2019-07-22 DIAGNOSIS — J1289 Other viral pneumonia: Secondary | ICD-10-CM | POA: Diagnosis not present

## 2019-07-22 DIAGNOSIS — K9423 Gastrostomy malfunction: Secondary | ICD-10-CM | POA: Diagnosis not present

## 2019-07-22 DIAGNOSIS — Z825 Family history of asthma and other chronic lower respiratory diseases: Secondary | ICD-10-CM

## 2019-07-22 DIAGNOSIS — Z430 Encounter for attention to tracheostomy: Secondary | ICD-10-CM | POA: Diagnosis not present

## 2019-07-22 DIAGNOSIS — J45909 Unspecified asthma, uncomplicated: Secondary | ICD-10-CM | POA: Diagnosis present

## 2019-07-22 DIAGNOSIS — R34 Anuria and oliguria: Secondary | ICD-10-CM | POA: Diagnosis not present

## 2019-07-22 DIAGNOSIS — T85598A Other mechanical complication of other gastrointestinal prosthetic devices, implants and grafts, initial encounter: Secondary | ICD-10-CM

## 2019-07-22 DIAGNOSIS — J95811 Postprocedural pneumothorax: Secondary | ICD-10-CM | POA: Diagnosis present

## 2019-07-22 DIAGNOSIS — Z4659 Encounter for fitting and adjustment of other gastrointestinal appliance and device: Secondary | ICD-10-CM

## 2019-07-22 DIAGNOSIS — J1282 Pneumonia due to coronavirus disease 2019: Secondary | ICD-10-CM | POA: Diagnosis not present

## 2019-07-22 DIAGNOSIS — A419 Sepsis, unspecified organism: Secondary | ICD-10-CM | POA: Diagnosis not present

## 2019-07-22 DIAGNOSIS — G6281 Critical illness polyneuropathy: Secondary | ICD-10-CM | POA: Diagnosis not present

## 2019-07-22 DIAGNOSIS — K9422 Gastrostomy infection: Secondary | ICD-10-CM | POA: Diagnosis not present

## 2019-07-22 DIAGNOSIS — J9382 Other air leak: Secondary | ICD-10-CM | POA: Diagnosis not present

## 2019-07-22 DIAGNOSIS — Y92239 Unspecified place in hospital as the place of occurrence of the external cause: Secondary | ICD-10-CM | POA: Diagnosis not present

## 2019-07-22 DIAGNOSIS — R0989 Other specified symptoms and signs involving the circulatory and respiratory systems: Secondary | ICD-10-CM

## 2019-07-22 DIAGNOSIS — L89626 Pressure-induced deep tissue damage of left heel: Secondary | ICD-10-CM | POA: Diagnosis not present

## 2019-07-22 DIAGNOSIS — X58XXXA Exposure to other specified factors, initial encounter: Secondary | ICD-10-CM | POA: Diagnosis not present

## 2019-07-22 DIAGNOSIS — R040 Epistaxis: Secondary | ICD-10-CM | POA: Diagnosis not present

## 2019-07-22 DIAGNOSIS — R319 Hematuria, unspecified: Secondary | ICD-10-CM | POA: Diagnosis not present

## 2019-07-22 DIAGNOSIS — J982 Interstitial emphysema: Secondary | ICD-10-CM | POA: Diagnosis not present

## 2019-07-22 DIAGNOSIS — L89616 Pressure-induced deep tissue damage of right heel: Secondary | ICD-10-CM | POA: Diagnosis not present

## 2019-07-22 DIAGNOSIS — R239 Unspecified skin changes: Secondary | ICD-10-CM

## 2019-07-22 DIAGNOSIS — D62 Acute posthemorrhagic anemia: Secondary | ICD-10-CM | POA: Diagnosis not present

## 2019-07-22 DIAGNOSIS — U071 COVID-19: Secondary | ICD-10-CM | POA: Diagnosis not present

## 2019-07-22 DIAGNOSIS — L89893 Pressure ulcer of other site, stage 3: Secondary | ICD-10-CM | POA: Diagnosis not present

## 2019-07-22 DIAGNOSIS — J9600 Acute respiratory failure, unspecified whether with hypoxia or hypercapnia: Secondary | ICD-10-CM

## 2019-07-22 DIAGNOSIS — E876 Hypokalemia: Secondary | ICD-10-CM | POA: Diagnosis not present

## 2019-07-22 DIAGNOSIS — L89896 Pressure-induced deep tissue damage of other site: Secondary | ICD-10-CM | POA: Diagnosis not present

## 2019-07-22 DIAGNOSIS — Z66 Do not resuscitate: Secondary | ICD-10-CM | POA: Diagnosis not present

## 2019-07-22 DIAGNOSIS — G92 Toxic encephalopathy: Secondary | ICD-10-CM | POA: Diagnosis not present

## 2019-07-22 DIAGNOSIS — J95859 Other complication of respirator [ventilator]: Secondary | ICD-10-CM | POA: Diagnosis not present

## 2019-07-22 DIAGNOSIS — B9561 Methicillin susceptible Staphylococcus aureus infection as the cause of diseases classified elsewhere: Secondary | ICD-10-CM | POA: Diagnosis present

## 2019-07-22 DIAGNOSIS — Z9281 Personal history of extracorporeal membrane oxygenation (ECMO): Secondary | ICD-10-CM

## 2019-07-22 DIAGNOSIS — Z7951 Long term (current) use of inhaled steroids: Secondary | ICD-10-CM

## 2019-07-22 DIAGNOSIS — E11649 Type 2 diabetes mellitus with hypoglycemia without coma: Secondary | ICD-10-CM | POA: Diagnosis not present

## 2019-07-22 DIAGNOSIS — D696 Thrombocytopenia, unspecified: Secondary | ICD-10-CM | POA: Diagnosis present

## 2019-07-22 DIAGNOSIS — J45998 Other asthma: Secondary | ICD-10-CM | POA: Diagnosis not present

## 2019-07-22 DIAGNOSIS — Z8616 Personal history of COVID-19: Secondary | ICD-10-CM | POA: Diagnosis not present

## 2019-07-22 DIAGNOSIS — D6489 Other specified anemias: Secondary | ICD-10-CM | POA: Diagnosis not present

## 2019-07-22 DIAGNOSIS — Z01818 Encounter for other preprocedural examination: Secondary | ICD-10-CM

## 2019-07-22 DIAGNOSIS — R339 Retention of urine, unspecified: Secondary | ICD-10-CM | POA: Diagnosis not present

## 2019-07-22 DIAGNOSIS — T859XXA Unspecified complication of internal prosthetic device, implant and graft, initial encounter: Secondary | ICD-10-CM

## 2019-07-22 DIAGNOSIS — Z9911 Dependence on respirator [ventilator] status: Secondary | ICD-10-CM | POA: Diagnosis not present

## 2019-07-22 DIAGNOSIS — I428 Other cardiomyopathies: Secondary | ICD-10-CM | POA: Diagnosis not present

## 2019-07-22 DIAGNOSIS — F419 Anxiety disorder, unspecified: Secondary | ICD-10-CM | POA: Diagnosis present

## 2019-07-22 DIAGNOSIS — G4700 Insomnia, unspecified: Secondary | ICD-10-CM | POA: Diagnosis not present

## 2019-07-22 DIAGNOSIS — R4589 Other symptoms and signs involving emotional state: Secondary | ICD-10-CM | POA: Diagnosis not present

## 2019-07-22 DIAGNOSIS — E874 Mixed disorder of acid-base balance: Secondary | ICD-10-CM | POA: Diagnosis not present

## 2019-07-22 DIAGNOSIS — Z431 Encounter for attention to gastrostomy: Secondary | ICD-10-CM

## 2019-07-22 DIAGNOSIS — R609 Edema, unspecified: Secondary | ICD-10-CM | POA: Diagnosis not present

## 2019-07-22 DIAGNOSIS — J939 Pneumothorax, unspecified: Secondary | ICD-10-CM

## 2019-07-22 DIAGNOSIS — J9690 Respiratory failure, unspecified, unspecified whether with hypoxia or hypercapnia: Secondary | ICD-10-CM

## 2019-07-22 DIAGNOSIS — D65 Disseminated intravascular coagulation [defibrination syndrome]: Secondary | ICD-10-CM | POA: Diagnosis not present

## 2019-07-22 DIAGNOSIS — M7989 Other specified soft tissue disorders: Secondary | ICD-10-CM | POA: Diagnosis not present

## 2019-07-22 DIAGNOSIS — Z4682 Encounter for fitting and adjustment of non-vascular catheter: Secondary | ICD-10-CM

## 2019-07-22 DIAGNOSIS — F329 Major depressive disorder, single episode, unspecified: Secondary | ICD-10-CM | POA: Diagnosis not present

## 2019-07-22 DIAGNOSIS — J9601 Acute respiratory failure with hypoxia: Secondary | ICD-10-CM

## 2019-07-22 DIAGNOSIS — G7281 Critical illness myopathy: Secondary | ICD-10-CM | POA: Diagnosis not present

## 2019-07-22 DIAGNOSIS — E870 Hyperosmolality and hypernatremia: Secondary | ICD-10-CM | POA: Diagnosis not present

## 2019-07-22 DIAGNOSIS — I509 Heart failure, unspecified: Secondary | ICD-10-CM

## 2019-07-22 DIAGNOSIS — L02211 Cutaneous abscess of abdominal wall: Secondary | ICD-10-CM | POA: Diagnosis not present

## 2019-07-22 DIAGNOSIS — D649 Anemia, unspecified: Secondary | ICD-10-CM | POA: Diagnosis not present

## 2019-07-22 DIAGNOSIS — R197 Diarrhea, unspecified: Secondary | ICD-10-CM | POA: Diagnosis not present

## 2019-07-22 DIAGNOSIS — Z0189 Encounter for other specified special examinations: Secondary | ICD-10-CM

## 2019-07-22 DIAGNOSIS — J80 Acute respiratory distress syndrome: Secondary | ICD-10-CM

## 2019-07-22 DIAGNOSIS — R9389 Abnormal findings on diagnostic imaging of other specified body structures: Secondary | ICD-10-CM | POA: Diagnosis not present

## 2019-07-22 DIAGNOSIS — I4891 Unspecified atrial fibrillation: Secondary | ICD-10-CM | POA: Diagnosis not present

## 2019-07-22 DIAGNOSIS — N179 Acute kidney failure, unspecified: Secondary | ICD-10-CM | POA: Diagnosis not present

## 2019-07-22 DIAGNOSIS — R5381 Other malaise: Secondary | ICD-10-CM | POA: Diagnosis not present

## 2019-07-22 DIAGNOSIS — E869 Volume depletion, unspecified: Secondary | ICD-10-CM | POA: Diagnosis not present

## 2019-07-22 DIAGNOSIS — J9602 Acute respiratory failure with hypercapnia: Secondary | ICD-10-CM | POA: Diagnosis not present

## 2019-07-22 DIAGNOSIS — L89892 Pressure ulcer of other site, stage 2: Secondary | ICD-10-CM | POA: Diagnosis not present

## 2019-07-22 DIAGNOSIS — R0600 Dyspnea, unspecified: Secondary | ICD-10-CM

## 2019-07-22 DIAGNOSIS — Z452 Encounter for adjustment and management of vascular access device: Secondary | ICD-10-CM

## 2019-07-22 DIAGNOSIS — H5704 Mydriasis: Secondary | ICD-10-CM | POA: Diagnosis not present

## 2019-07-22 DIAGNOSIS — Z800 Family history of malignant neoplasm of digestive organs: Secondary | ICD-10-CM

## 2019-07-22 DIAGNOSIS — Z833 Family history of diabetes mellitus: Secondary | ICD-10-CM

## 2019-07-22 DIAGNOSIS — J9501 Hemorrhage from tracheostomy stoma: Secondary | ICD-10-CM | POA: Diagnosis not present

## 2019-07-22 DIAGNOSIS — R509 Fever, unspecified: Secondary | ICD-10-CM | POA: Diagnosis not present

## 2019-07-22 DIAGNOSIS — Z538 Procedure and treatment not carried out for other reasons: Secondary | ICD-10-CM | POA: Diagnosis not present

## 2019-07-22 DIAGNOSIS — J324 Chronic pansinusitis: Secondary | ICD-10-CM | POA: Diagnosis not present

## 2019-07-22 DIAGNOSIS — Z419 Encounter for procedure for purposes other than remedying health state, unspecified: Secondary | ICD-10-CM

## 2019-07-22 DIAGNOSIS — Z87442 Personal history of urinary calculi: Secondary | ICD-10-CM

## 2019-07-22 DIAGNOSIS — L8990 Pressure ulcer of unspecified site, unspecified stage: Secondary | ICD-10-CM | POA: Insufficient documentation

## 2019-07-22 DIAGNOSIS — Z79899 Other long term (current) drug therapy: Secondary | ICD-10-CM

## 2019-07-22 DIAGNOSIS — J948 Other specified pleural conditions: Secondary | ICD-10-CM | POA: Diagnosis not present

## 2019-07-22 LAB — CBC WITH DIFFERENTIAL/PLATELET
Abs Immature Granulocytes: 0.02 10*3/uL (ref 0.00–0.07)
Basophils Absolute: 0 10*3/uL (ref 0.0–0.1)
Basophils Relative: 0 %
Eosinophils Absolute: 0 10*3/uL (ref 0.0–0.5)
Eosinophils Relative: 0 %
HCT: 45.2 % (ref 39.0–52.0)
Hemoglobin: 14.4 g/dL (ref 13.0–17.0)
Immature Granulocytes: 1 %
Lymphocytes Relative: 33 %
Lymphs Abs: 0.9 10*3/uL (ref 0.7–4.0)
MCH: 27 pg (ref 26.0–34.0)
MCHC: 31.9 g/dL (ref 30.0–36.0)
MCV: 84.6 fL (ref 80.0–100.0)
Monocytes Absolute: 0.1 10*3/uL (ref 0.1–1.0)
Monocytes Relative: 5 %
Neutro Abs: 1.7 10*3/uL (ref 1.7–7.7)
Neutrophils Relative %: 61 %
Platelets: 92 10*3/uL — ABNORMAL LOW (ref 150–400)
RBC: 5.34 MIL/uL (ref 4.22–5.81)
RDW: 13.2 % (ref 11.5–15.5)
WBC: 2.8 10*3/uL — ABNORMAL LOW (ref 4.0–10.5)
nRBC: 0 % (ref 0.0–0.2)

## 2019-07-22 LAB — URINALYSIS, ROUTINE W REFLEX MICROSCOPIC
Bacteria, UA: NONE SEEN
Bilirubin Urine: NEGATIVE
Glucose, UA: NEGATIVE mg/dL
Hgb urine dipstick: NEGATIVE
Ketones, ur: 5 mg/dL — AB
Leukocytes,Ua: NEGATIVE
Nitrite: NEGATIVE
Protein, ur: 100 mg/dL — AB
Specific Gravity, Urine: 1.025 (ref 1.005–1.030)
pH: 5 (ref 5.0–8.0)

## 2019-07-22 LAB — CREATININE, SERUM
Creatinine, Ser: 0.93 mg/dL (ref 0.61–1.24)
GFR calc Af Amer: >60 mL/min (ref >60)
GFR calc non Af Amer: >60 mL/min (ref >60)

## 2019-07-22 LAB — FERRITIN: Ferritin: 588 ng/mL — ABNORMAL HIGH (ref 24–336)

## 2019-07-22 LAB — COMPREHENSIVE METABOLIC PANEL
ALT: 36 U/L (ref 0–44)
AST: 58 U/L — ABNORMAL HIGH (ref 15–41)
Albumin: 3.3 g/dL — ABNORMAL LOW (ref 3.5–5.0)
Alkaline Phosphatase: 50 U/L (ref 38–126)
Anion gap: 13 (ref 5–15)
BUN: 17 mg/dL (ref 6–20)
CO2: 27 mmol/L (ref 22–32)
Calcium: 8.3 mg/dL — ABNORMAL LOW (ref 8.9–10.3)
Chloride: 97 mmol/L — ABNORMAL LOW (ref 98–111)
Creatinine, Ser: 0.9 mg/dL (ref 0.61–1.24)
GFR calc Af Amer: >60 mL/min (ref >60)
GFR calc non Af Amer: >60 mL/min (ref >60)
Glucose, Bld: 94 mg/dL (ref 70–99)
Potassium: 3.6 mmol/L (ref 3.5–5.1)
Sodium: 137 mmol/L (ref 135–145)
Total Bilirubin: 0.3 mg/dL (ref 0.3–1.2)
Total Protein: 7.1 g/dL (ref 6.5–8.1)

## 2019-07-22 LAB — CBC
HCT: 44 % (ref 39.0–52.0)
Hemoglobin: 14.4 g/dL (ref 13.0–17.0)
MCH: 27.5 pg (ref 26.0–34.0)
MCHC: 32.7 g/dL (ref 30.0–36.0)
MCV: 84.1 fL (ref 80.0–100.0)
Platelets: 94 10*3/uL — ABNORMAL LOW (ref 150–400)
RBC: 5.23 MIL/uL (ref 4.22–5.81)
RDW: 13.2 % (ref 11.5–15.5)
WBC: 3.2 10*3/uL — ABNORMAL LOW (ref 4.0–10.5)
nRBC: 0 % (ref 0.0–0.2)

## 2019-07-22 LAB — C-REACTIVE PROTEIN: CRP: 9.3 mg/dL — ABNORMAL HIGH (ref <1.0)

## 2019-07-22 LAB — HIV ANTIBODY (ROUTINE TESTING W REFLEX): HIV Screen 4th Generation wRfx: NONREACTIVE

## 2019-07-22 LAB — TROPONIN I (HIGH SENSITIVITY)
Troponin I (High Sensitivity): 10 ng/L (ref <18)
Troponin I (High Sensitivity): 10 ng/L (ref <18)

## 2019-07-22 LAB — ABO/RH: ABO/RH(D): O POS

## 2019-07-22 LAB — PROCALCITONIN: Procalcitonin: <0.1 ng/mL

## 2019-07-22 LAB — BRAIN NATRIURETIC PEPTIDE: B Natriuretic Peptide: 15.3 pg/mL (ref 0.0–100.0)

## 2019-07-22 LAB — LACTIC ACID, PLASMA
Lactic Acid, Venous: 1.2 mmol/L (ref 0.5–1.9)
Lactic Acid, Venous: 1.1 mmol/L (ref 0.5–1.9)

## 2019-07-22 LAB — D-DIMER, QUANTITATIVE: D-Dimer, Quant: 0.57 ug/mL-FEU — ABNORMAL HIGH (ref 0.00–0.50)

## 2019-07-22 LAB — LACTATE DEHYDROGENASE: LDH: 514 U/L — ABNORMAL HIGH (ref 98–192)

## 2019-07-22 LAB — FIBRINOGEN: Fibrinogen: 598 mg/dL — ABNORMAL HIGH (ref 210–475)

## 2019-07-22 LAB — TRIGLYCERIDES: Triglycerides: 95 mg/dL (ref <150)

## 2019-07-22 MED ORDER — ASCORBIC ACID 500 MG PO TABS
500.0000 mg | ORAL_TABLET | Freq: Every day | ORAL | Status: DC
  Administered 2019-07-22 – 2019-08-02 (×12): 500 mg via ORAL
  Filled 2019-07-22 (×11): qty 1

## 2019-07-22 MED ORDER — GUAIFENESIN-DM 100-10 MG/5ML PO SYRP
10.0000 mL | ORAL_SOLUTION | ORAL | Status: DC | PRN
  Administered 2019-07-23: 5 mL via ORAL
  Administered 2019-07-26 – 2019-08-01 (×7): 10 mL via ORAL
  Filled 2019-07-22 (×9): qty 10

## 2019-07-22 MED ORDER — ALBUTEROL SULFATE HFA 108 (90 BASE) MCG/ACT IN AERS
4.0000 | INHALATION_SPRAY | RESPIRATORY_TRACT | Status: DC | PRN
  Administered 2019-07-22: 4 via RESPIRATORY_TRACT
  Filled 2019-07-22: qty 6.7

## 2019-07-22 MED ORDER — ZINC SULFATE 220 (50 ZN) MG PO CAPS
220.0000 mg | ORAL_CAPSULE | Freq: Every day | ORAL | Status: DC
  Administered 2019-07-22 – 2019-08-02 (×12): 220 mg via ORAL
  Filled 2019-07-22 (×12): qty 1

## 2019-07-22 MED ORDER — SODIUM CHLORIDE 0.9 % IV SOLN
100.0000 mg | Freq: Every day | INTRAVENOUS | Status: AC
  Administered 2019-07-23 – 2019-07-26 (×4): 100 mg via INTRAVENOUS
  Filled 2019-07-22: qty 20
  Filled 2019-07-22: qty 100
  Filled 2019-07-22 (×2): qty 20

## 2019-07-22 MED ORDER — ACETAMINOPHEN 325 MG PO TABS
650.0000 mg | ORAL_TABLET | Freq: Four times a day (QID) | ORAL | Status: DC | PRN
  Administered 2019-07-22 – 2019-08-02 (×10): 650 mg via ORAL
  Filled 2019-07-22 (×10): qty 2

## 2019-07-22 MED ORDER — ONDANSETRON HCL 4 MG PO TABS: 4.0000 mg | ORAL_TABLET | Freq: Four times a day (QID) | ORAL | Status: DC | PRN

## 2019-07-22 MED ORDER — ACETAMINOPHEN 500 MG PO TABS
1000.0000 mg | ORAL_TABLET | Freq: Once | ORAL | Status: AC
  Administered 2019-07-22: 1000 mg via ORAL
  Filled 2019-07-22: qty 2

## 2019-07-22 MED ORDER — ONDANSETRON HCL 4 MG/2ML IJ SOLN
4.0000 mg | Freq: Four times a day (QID) | INTRAMUSCULAR | Status: DC | PRN
  Administered 2019-07-29: 4 mg via INTRAVENOUS
  Filled 2019-07-22: qty 2

## 2019-07-22 MED ORDER — METHYLPREDNISOLONE SODIUM SUCC 125 MG IJ SOLR
0.5000 mg/kg | Freq: Two times a day (BID) | INTRAMUSCULAR | Status: DC
  Administered 2019-07-22 – 2019-07-25 (×6): 68.125 mg via INTRAVENOUS
  Filled 2019-07-22 (×7): qty 2

## 2019-07-22 MED ORDER — ENOXAPARIN SODIUM 40 MG/0.4ML ~~LOC~~ SOLN
40.0000 mg | SUBCUTANEOUS | Status: DC
  Administered 2019-07-22 – 2019-07-23 (×2): 40 mg via SUBCUTANEOUS
  Filled 2019-07-22: qty 0.4

## 2019-07-22 MED ORDER — SODIUM CHLORIDE 0.9 % IV SOLN
200.0000 mg | Freq: Once | INTRAVENOUS | Status: AC
  Administered 2019-07-22: 200 mg via INTRAVENOUS
  Filled 2019-07-22: qty 40

## 2019-07-22 MED ORDER — ALBUTEROL SULFATE HFA 108 (90 BASE) MCG/ACT IN AERS
2.0000 | INHALATION_SPRAY | RESPIRATORY_TRACT | Status: DC
  Administered 2019-07-23 – 2019-07-27 (×27): 2 via RESPIRATORY_TRACT
  Filled 2019-07-22 (×2): qty 6.7

## 2019-07-22 MED ORDER — HYDROCODONE-HOMATROPINE 5-1.5 MG/5ML PO SYRP
10.0000 mL | ORAL_SOLUTION | Freq: Once | ORAL | Status: AC
  Administered 2019-07-22: 10 mL via ORAL
  Filled 2019-07-22: qty 10

## 2019-07-22 MED ORDER — SODIUM CHLORIDE 0.9% FLUSH
3.0000 mL | Freq: Once | INTRAVENOUS | Status: AC
  Administered 2019-07-22: 3 mL via INTRAVENOUS

## 2019-07-22 MED ORDER — HYDROCOD POLST-CPM POLST ER 10-8 MG/5ML PO SUER
5.0000 mL | Freq: Two times a day (BID) | ORAL | Status: DC | PRN
  Administered 2019-07-23 – 2019-07-31 (×7): 5 mL via ORAL
  Filled 2019-07-22 (×8): qty 5

## 2019-07-22 NOTE — ED Triage Notes (Signed)
Pt reports getting flu like symptoms that started last Tuesday got tested for covid on Friday and was positive. Pt reports he is now having loose bowels and also vomitied three times last night. Pt reports still have chills and body aches. Pt also reports mild sob with exertion and productive cough.

## 2019-07-22 NOTE — H&P (Signed)
History and Physical    JOHANTHAN KNEELAND FKC:[id number] DOB: [birth year] DOA: 07/22/2019  PCP: Tamsen Roers, MD  Patient coming from: Home  I have personally briefly reviewed patient's old medical records in [place]  Chief Complaint: Cough, shortness of breath and fever.  Covid +3 days ago.  HPI: [name] is a 43 y.o. male with medical history significant of hypertension, GERD, asthma presents to emergency department due to worsening cough, shortness of breath and fever since 1 week.    Patient tells me that he tested positive for COVID-19 3 days ago.  Has productive cough with greenish-brown sputum, associated with shortness of breath, wheezing chest pain when he coughs, fever, decreased appetite, generalized weakness, loose stool, lethargy.  He tells me that his symptoms started getting worse since yesterday so he decided to come to the emergency department for further evaluation and management.  He has history of asthma however he does not use oxygen at home.  He lives with his wife and his kids.  No history of smoking, alcohol, illicit drug use.    ED Course: Upon arrival patient's vital signs stable, hypoxic with oxygen saturation of 88% on room air was placed on 2 L of oxygen via nasal cannula.  Chest x-ray concerning for pneumonia.  Patient received albuterol breathing treatment, Tylenol, Hycodan syrup in the ED.     Review of Systems: As per HPI otherwise negative.    Past Medical History:  Diagnosis Date  . Asthma   . GERD (gastroesophageal reflux disease)   . Headache   . History of kidney stones    LEFT URETERAL STONE  . HTN (hypertension)   . Pancreatitis 2018   GALLBALDDER SLUDGE CAUSED ISSUED RESOLVED    Past Surgical History:  Procedure Laterality Date  . CYSTOSCOPY/URETEROSCOPY/HOLMIUM LASER/STENT PLACEMENT Left 04/18/2019   Procedure: LEFT URETEROSCOPY/HOLMIUM LASER/STENT PLACEMENT;  Surgeon: Ardis Hughs, MD;  Location: [facility];  Service: Urology;  Laterality: Left;  . CYSTOSCOPY/URETEROSCOPY/HOLMIUM LASER/STENT PLACEMENT Left 05/02/2019   Procedure: CYSTOSCOPY/URETEROSCOPY/HOLMIUM LASER/STENT EXCHANGE;  Surgeon: Ardis Hughs, MD;  Location: [facility];  Service: Urology;  Laterality: Left;  . IRRIGATION AND DEBRIDEMENT SHOULDER Right 09/29/2017   Procedure: IRRIGATION AND DEBRIDEMENT SHOULDER;  Surgeon: Leandrew Koyanagi, MD;  Location: [facility];  Service: Orthopedics;  Laterality: Right;  . [place] [facility]  2002     reports that he has never smoked. He has never used smokeless tobacco. He reports current alcohol use. He reports that he does not use drugs.  No Known Allergies  Family History  Problem Relation Age of Onset  . Asthma Brother   . Diabetes Father   . Hypertension Father   . Diabetes Paternal Grandmother   . Hypertension Paternal Grandmother   . Migraines Mother   . GER disease Mother   . Pancreatic cancer Maternal Grandmother   . Heart disease Maternal Grandfather     Prior to Admission medications   Medication Sig Start Date End Date Taking? Authorizing Provider  Albuterol Sulfate (PROAIR RESPICLICK) 749 (90 Base) MCG/ACT AEPB Inhale 2 puffs into the lungs every 4 (four) hours as needed (Shortness of breath). 05/28/19   Valentina Shaggy, MD  budesonide-formoterol [facility]) 160-4.5 MCG/ACT inhaler Inhale 2 puffs into the lungs daily as needed (cough or wheeze). Inhale two puffs ONCE daily to prevent cough or wheeze. Rinse, gargle, and spit after use. 05/28/19   Valentina Shaggy, MD  cetirizine (ZYRTEC) 10 MG tablet Take 1  tablet (10 mg total) by mouth daily. 02/06/18   Kozlow, Donnamarie Poag, MD  ibuprofen (ADVIL) 200 MG tablet Take 600 mg by mouth every 6 (six) hours as needed for headache or mild pain.    [provider]  lisinopril-hydrochlorothiazide (ZESTORETIC) 10-12.5 MG tablet Take 1 tablet by mouth daily. 04/08/19   [provider]  omeprazole (PRILOSEC) 20 MG  capsule Take 40 mg by mouth daily.     [provider]  predniSONE (DELTASONE) 10 MG tablet Take two tablets (75m) twice daily for three days, then one tablet (170m twice daily for three days, then STOP. 07/16/19   GaValentina ShaggyMD  triamcinolone (NASACORT ALLERGY 24HR) 55 MCG/ACT AERO nasal inhaler Place 2 sprays into the nose daily. Patient taking differently: Place 2 sprays into the nose daily as needed (allergies).  02/06/18   Kozlow, ErDonnamarie PoagMD    Physical Exam: Vitals:   07/22/19 1003 07/22/19 1100 07/22/19 1130 07/22/19 1200  BP: 134/84 128/75 127/76 132/70  Pulse: 87 84 84 84  Resp: 16 18 14  (!) 21  Temp: 99.1 F (37.3 C)     TempSrc: Oral     SpO2: 93% 94% (!) 88% (!) 89%    Constitutional: NAD, calm, comfortable Eyes: PERRL, lids and conjunctivae normal ENMT: Mucous membranes are moist. Posterior pharynx clear of any exudate or lesions.Normal dentition.  Neck: normal, supple, no masses, no thyromegaly Respiratory: clear to auscultation bilaterally, no wheezing, no crackles. Normal respiratory effort. No accessory muscle use.  Cardiovascular: Regular rate and rhythm, no murmurs / rubs / gallops. No extremity edema. 2+ pedal pulses. No carotid bruits.  Abdomen: no tenderness, no masses palpated. No hepatosplenomegaly. Bowel sounds positive.  Musculoskeletal: no clubbing / cyanosis. No joint deformity upper and lower extremities. Good ROM, no contractures. Normal muscle tone.  Skin: no rashes, lesions, ulcers. No induration Neurologic: CN 2-12 grossly intact. Sensation intact, DTR normal. Strength 5/5 in all 4.  Psychiatric: Normal judgment and insight. Alert and oriented x 3. Normal mood.    Labs on Admission: I have personally reviewed following labs and imaging studies  CBC: Recent Labs  Lab 07/22/19 1039  WBC 2.8*  NEUTROABS 1.7  HGB 14.4  HCT 45.2  MCV 84.6  PLT 92*   Basic Metabolic Panel: Recent Labs  Lab 07/22/19 1039  NA 137  K 3.6   CL 97*  CO2 27  GLUCOSE 94  BUN 17  CREATININE 0.90  CALCIUM 8.3*   GFR: CrCl cannot be calculated (Unknown ideal weight.). Liver Function Tests: Recent Labs  Lab 07/22/19 1039  AST 58*  ALT 36  ALKPHOS 50  BILITOT 0.3  PROT 7.1  ALBUMIN 3.3*   No results for input(s): LIPASE, AMYLASE in the last 168 hours. No results for input(s): AMMONIA in the last 168 hours. Coagulation Profile: No results for input(s): INR, PROTIME in the last 168 hours. Cardiac Enzymes: No results for input(s): CKTOTAL, CKMB, CKMBINDEX, TROPONINI in the last 168 hours. BNP (last 3 results) No results for input(s): PROBNP in the last 8760 hours. HbA1C: No results for input(s): HGBA1C in the last 72 hours. CBG: No results for input(s): GLUCAP in the last 168 hours. Lipid Profile: Recent Labs    07/22/19 1038  TRIG 95   Thyroid Function Tests: No results for input(s): TSH, T4TOTAL, FREET4, T3FREE, THYROIDAB in the last 72 hours. Anemia Panel: Recent Labs    07/22/19 1039  FERRITIN 588*   Urine analysis:    Component  Value Date/Time   COLORURINE YELLOW 04/13/2019 1828   APPEARANCEUR HAZY (A) 04/13/2019 1828   LABSPEC 1.017 04/13/2019 1828   PHURINE 5.0 04/13/2019 1828   GLUCOSEU NEGATIVE 04/13/2019 1828   HGBUR LARGE (A) 04/13/2019 1828   BILIRUBINUR NEGATIVE 04/13/2019 1828   KETONESUR NEGATIVE 04/13/2019 1828   PROTEINUR NEGATIVE 04/13/2019 1828   NITRITE NEGATIVE 04/13/2019 1828   LEUKOCYTESUR NEGATIVE 04/13/2019 1828    Radiological Exams on Admission: DG Chest Portable 1 View  Result Date: 07/22/2019 CLINICAL DATA:  Hypoxia. COVID-19. Cough. Diarrhea and vomiting. EXAM: PORTABLE CHEST 1 VIEW COMPARISON:  None FINDINGS: There are faint infiltrates in the right midzone and at the right base laterally. Left lung is clear. Heart size and vascularity are normal. No effusions. No significant bone abnormality. IMPRESSION: Faint infiltrates in the right midzone and at the right lung  base. The appearance is suspicious for pneumonia. Electronically Signed   By: Lorriane Shire M.D.   On: 07/22/2019 10:48    EKG: Normal sinus rhythm, no ST elevation or depression noted.  Assessment/Plan Principal Problem:   Acute hypoxemic respiratory failure due to COVID-19 [facility]) Active Problems:   GERD (gastroesophageal reflux disease)   Essential hypertension   Asthma   Thrombocytopenia (HCC)   Leukopenia    Acute hypoxemic respiratory failure due to Covid pneumonia: -Patient tested positive for Covid 3 days ago.  Chest x-ray as above. -Admit patient on the floor for close monitoring.  Currently he is requiring 2 L of oxygen via nasal cannula.  He is not on oxygen at home.  On continuous pulse ox.  Try to wean off of oxygen. -Lactic acid: WNL, troponin: Negative.  Inflammatory markers elevated.  Repeat inflammatory markers tomorrow AM. -Start on IV Solu-Medrol and consulted pharmacy for remdesivir. -Albuterol every 4 hour.  Avoid NSAIDs. -Start on p.o. zinc and vitamin C. -Patient was told that if COVID-19 pneumonitis gets worse we might potentially use Actemra off label, he denies any known history of tuberculosis or hepatitis, understands the risk and benefits and wants to proceed with Actemra treatment if required.  Hypertension: Blood pressure is stable -Continue home meds-lisinopril-HCTZ -Monitor blood pressure closely.  GERD: Stable continue PPI  Asthma: On ProAir and Symbicort at home.  Will continue same.  Thrombocytopenia/leukopenia: -Could be due to COVID-19 infection. -Monitor CBC.  No signs of active bleeding.  DVT prophylaxis: TED/SCD/Lovenox Code Status: Full code Family Communication: None present at bedside.  Plan of care discussed with patient in length and he verbalized understanding and agreed with it. Disposition Plan: To be determined Consults called: None Admission status: Inpatient   Mckinley Jewel MD [facility] Pager [phone]  If 7PM-7AM, please contact night-coverage [email or web address] Password [email or web address]  07/22/2019, 1:04 PM

## 2019-07-22 NOTE — ED Provider Notes (Signed)
[facility] EMERGENCY DEPARTMENT Provider Note   CSN: [id number] Arrival date & time: 07/22/19  9622     History Chief Complaint  Patient presents with  . covid +    [name] is a 43 y.o. male.  HPI Patient developed symptoms 7 days ago.  At onset he reports fevers and body aches.  Patient subsequently got nasal congestion and chest congestion.  He was tested for Covid 4 days ago and tested positive.  His allergist put him on prednisone which he finished yesterday.  He reports he has gotten progressively worse with shortness of breath.  Patient does have history of asthma and uses Symbicort regularly with proair rescue inhaler.  He reports he has started using his rescue inhaler a lot more.  He continues to feel very short of breath and cannot sleep at night.  Reports he has extensive dry cough that is causing him to have aching around both sides of his chest and his abdomen.  Patient reports he vomited twice last night.  He is also had some loose stool.  Patient reports due to shortness of breath and difficulty breathing and coughing at night he has not slept for almost 6 days.  No swelling of the legs, no pain in the calves.    Past Medical History:  Diagnosis Date  . Asthma   . GERD (gastroesophageal reflux disease)   . Headache   . History of kidney stones    LEFT URETERAL STONE  . HTN (hypertension)   . Pancreatitis 2018   GALLBALDDER SLUDGE CAUSED ISSUED RESOLVED    Patient Active Problem List   Diagnosis Date Noted  . Fever   . Gram positive bacterial infection   . Septic arthritis of right acromioclavicular joint ([place]) 09/29/2017  . Chronic left-sided low back pain with left-sided sciatica 09/19/2017  . Epigastric pain   . Pancreatitis 04/17/2017  . Essential hypertension 04/17/2017  . Moderate persistent asthma 07/21/2015  . Allergic rhinoconjunctivitis 07/21/2015  . Esophageal reflux 07/21/2015  . ABNORMALITY OF GAIT 11/12/2009  . TARSAL  TUNNEL SYNDROME, LEFT 10/08/2009  . PES PLANUS 10/08/2009    Past Surgical History:  Procedure Laterality Date  . CYSTOSCOPY/URETEROSCOPY/HOLMIUM LASER/STENT PLACEMENT Left 04/18/2019   Procedure: LEFT URETEROSCOPY/HOLMIUM LASER/STENT PLACEMENT;  Surgeon: Ardis Hughs, MD;  Location: [facility];  Service: Urology;  Laterality: Left;  . CYSTOSCOPY/URETEROSCOPY/HOLMIUM LASER/STENT PLACEMENT Left 05/02/2019   Procedure: CYSTOSCOPY/URETEROSCOPY/HOLMIUM LASER/STENT EXCHANGE;  Surgeon: Ardis Hughs, MD;  Location: [facility];  Service: Urology;  Laterality: Left;  . IRRIGATION AND DEBRIDEMENT SHOULDER Right 09/29/2017   Procedure: IRRIGATION AND DEBRIDEMENT SHOULDER;  Surgeon: Leandrew Koyanagi, MD;  Location: [place];  Service: Orthopedics;  Laterality: Right;  . LUMBAR DISC SURGERY  2002       Family History  Problem Relation Age of Onset  . Asthma Brother   . Diabetes Father   . Hypertension Father   . Diabetes Paternal Grandmother   . Hypertension Paternal Grandmother   . Migraines Mother   . GER disease Mother   . Pancreatic cancer Maternal Grandmother   . Heart disease Maternal Grandfather     Social History   Tobacco Use  . Smoking status: Never Smoker  . Smokeless tobacco: Never Used  Substance Use Topics  . Alcohol use: Yes    Comment: rarely 2-3 times a year  . Drug use: No    Home Medications Prior to Admission medications   Medication Sig Start Date  End Date Taking? Authorizing Provider  Albuterol Sulfate (PROAIR RESPICLICK) 254 (90 Base) MCG/ACT AEPB Inhale 2 puffs into the lungs every 4 (four) hours as needed (Shortness of breath). 05/28/19   Valentina Shaggy, MD  budesonide-formoterol [facility]) 160-4.5 MCG/ACT inhaler Inhale 2 puffs into the lungs daily as needed (cough or wheeze). Inhale two puffs ONCE daily to prevent cough or wheeze. Rinse, gargle, and spit after use. 05/28/19   Valentina Shaggy, MD  cetirizine (ZYRTEC) 10 MG  tablet Take 1 tablet (10 mg total) by mouth daily. 02/06/18   Kozlow, Donnamarie Poag, MD  ibuprofen (ADVIL) 200 MG tablet Take 600 mg by mouth every 6 (six) hours as needed for headache or mild pain.    [provider]  lisinopril-hydrochlorothiazide (ZESTORETIC) 10-12.5 MG tablet Take 1 tablet by mouth daily. 04/08/19   [provider]  omeprazole (PRILOSEC) 20 MG capsule Take 40 mg by mouth daily.     [provider]  predniSONE (DELTASONE) 10 MG tablet Take two tablets (36m) twice daily for three days, then one tablet (14m twice daily for three days, then STOP. 07/16/19   GaValentina ShaggyMD  triamcinolone (NASACORT ALLERGY 24HR) 55 MCG/ACT AERO nasal inhaler Place 2 sprays into the nose daily. Patient taking differently: Place 2 sprays into the nose daily as needed (allergies).  02/06/18   Kozlow, ErDonnamarie PoagMD    Allergies    Patient has no known allergies.  Review of Systems   Review of Systems 10 Systems reviewed and are negative for acute change except as noted in the HPI. Physical Exam Updated Vital Signs BP 134/84 (BP Location: Right Arm)   Pulse 87   Temp 99.1 F (37.3 C) (Oral)   Resp 16   SpO2 93%   Physical Exam Constitutional:      Comments: Patient is alert.  Mild increased work of breathing at rest.  Speaking in clear full sentences.  HENT:     Head: Normocephalic and atraumatic.  Eyes:     Extraocular Movements: Extraocular movements intact.  Cardiovascular:     Rate and Rhythm: Normal rate and regular rhythm.  Pulmonary:     Comments: Breath sounds are diminished on the right relative to the left.  Few scattered crackles.  Occasionally patient has dry paroxysmal cough Abdominal:     General: There is no distension.     Palpations: Abdomen is soft.     Tenderness: There is no abdominal tenderness. There is no guarding.  Musculoskeletal:        General: No swelling or tenderness. Normal range of motion.     Right lower leg: No edema.      Left lower leg: No edema.  Skin:    General: Skin is warm and dry.  Neurological:     General: No focal deficit present.     Mental Status: He is oriented to person, place, and time.     Cranial Nerves: No cranial nerve deficit.     Coordination: Coordination normal.  Psychiatric:        Mood and Affect: Mood normal.     ED Results / Procedures / Treatments   Labs (all labs ordered are listed, but only abnormal results are displayed) Labs Reviewed  LACTIC ACID, PLASMA  LACTIC ACID, PLASMA  COMPREHENSIVE METABOLIC PANEL  CBC WITH DIFFERENTIAL/PLATELET  URINALYSIS, ROUTINE W REFLEX MICROSCOPIC    EKG EKG Interpretation  Date/Time:  Monday July 22 2019 10:04:05 EST Ventricular Rate:  86 PR  Interval:    QRS Duration: 107 QT Interval:  377 QTC Calculation: 451 R Axis:   17 Text Interpretation: Sinus rhythm Low voltage, precordial leads Probable anteroseptal infarct, old no sig change from previous Confirmed by Charlesetta Shanks [phone]) on 07/22/2019 10:49:13 AM   Radiology No results found.  Procedures Procedures (including critical care time)  Medications Ordered in ED Medications  sodium chloride flush (NS) 0.9 % injection 3 mL (3 mLs Intravenous Given 07/22/19 1038)    ED Course  I have reviewed the triage vital signs and the nursing notes.  Pertinent labs & imaging results that were available during my care of the patient were reviewed by me and considered in my medical decision making (see chart for details).    MDM Rules/Calculators/A&P     CHA2DS2/VAS Stroke Risk Points      N/A >= 2 Points: High Risk  1 - 1.99 Points: Medium Risk  0 Points: Low Risk    A final score could not be computed because of missing components.: Last  Change: N/A     This score determines the patient's risk of having a stroke if the  patient has atrial fibrillation.      This score is not applicable to this patient. Components are not  calculated.                    Patient has history of asthma with Covid positive pneumonia.  He is currently at 7 days of symptoms and getting worse.  Oxygen saturations are approximately 93% on room air.  At this time, he does not require any airway intervention.  Will place on 2 L nasal cannula oxygen.  Patient has taken 5 days of prednisone prescribed by his allergist.  Will plan for admission. Final Clinical Impression(s) / ED Diagnoses Final diagnoses:  Pneumonia due to COVID-19 virus  Persistent asthma with undetermined severity    Rx / DC Orders ED Discharge Orders    None       Charlesetta Shanks, MD 07/22/19 1114

## 2019-07-22 NOTE — ED Notes (Signed)
Patient in bed, resting. No s/s of distress, EKG in NSR. VSS. Informed patient of this findings and will continue to monitor.

## 2019-07-23 ENCOUNTER — Encounter (HOSPITAL_COMMUNITY): Payer: Self-pay | Admitting: Internal Medicine

## 2019-07-23 DIAGNOSIS — D696 Thrombocytopenia, unspecified: Secondary | ICD-10-CM

## 2019-07-23 DIAGNOSIS — U071 COVID-19: Secondary | ICD-10-CM

## 2019-07-23 DIAGNOSIS — J1282 Pneumonia due to coronavirus disease 2019: Secondary | ICD-10-CM

## 2019-07-23 DIAGNOSIS — D72819 Decreased white blood cell count, unspecified: Secondary | ICD-10-CM

## 2019-07-23 LAB — COMPREHENSIVE METABOLIC PANEL
ALT: 40 U/L (ref 0–44)
AST: 56 U/L — ABNORMAL HIGH (ref 15–41)
Albumin: 3.1 g/dL — ABNORMAL LOW (ref 3.5–5.0)
Alkaline Phosphatase: 49 U/L (ref 38–126)
Anion gap: 11 (ref 5–15)
BUN: 16 mg/dL (ref 6–20)
CO2: 30 mmol/L (ref 22–32)
Calcium: 8.5 mg/dL — ABNORMAL LOW (ref 8.9–10.3)
Chloride: 96 mmol/L — ABNORMAL LOW (ref 98–111)
Creatinine, Ser: 0.94 mg/dL (ref 0.61–1.24)
GFR calc Af Amer: >60 mL/min (ref >60)
GFR calc non Af Amer: >60 mL/min (ref >60)
Glucose, Bld: 156 mg/dL — ABNORMAL HIGH (ref 70–99)
Potassium: 3.6 mmol/L (ref 3.5–5.1)
Sodium: 137 mmol/L (ref 135–145)
Total Bilirubin: 0.3 mg/dL (ref 0.3–1.2)
Total Protein: 6.9 g/dL (ref 6.5–8.1)

## 2019-07-23 LAB — CBC WITH DIFFERENTIAL/PLATELET
Abs Immature Granulocytes: 0.01 10*3/uL (ref 0.00–0.07)
Basophils Absolute: 0 10*3/uL (ref 0.0–0.1)
Basophils Relative: 0 %
Eosinophils Absolute: 0 10*3/uL (ref 0.0–0.5)
Eosinophils Relative: 0 %
HCT: 44.9 % (ref 39.0–52.0)
Hemoglobin: 14.6 g/dL (ref 13.0–17.0)
Immature Granulocytes: 1 %
Lymphocytes Relative: 29 %
Lymphs Abs: 0.6 10*3/uL — ABNORMAL LOW (ref 0.7–4.0)
MCH: 27.5 pg (ref 26.0–34.0)
MCHC: 32.5 g/dL (ref 30.0–36.0)
MCV: 84.6 fL (ref 80.0–100.0)
Monocytes Absolute: 0.1 10*3/uL (ref 0.1–1.0)
Monocytes Relative: 5 %
Neutro Abs: 1.4 10*3/uL — ABNORMAL LOW (ref 1.7–7.7)
Neutrophils Relative %: 65 %
Platelets: 96 10*3/uL — ABNORMAL LOW (ref 150–400)
RBC: 5.31 MIL/uL (ref 4.22–5.81)
RDW: 13.1 % (ref 11.5–15.5)
WBC: 2.2 10*3/uL — ABNORMAL LOW (ref 4.0–10.5)
nRBC: 0 % (ref 0.0–0.2)

## 2019-07-23 LAB — EXPECTORATED SPUTUM ASSESSMENT W GRAM STAIN, RFLX TO RESP C

## 2019-07-23 LAB — PHOSPHORUS: Phosphorus: 4 mg/dL (ref 2.5–4.6)

## 2019-07-23 LAB — C-REACTIVE PROTEIN: CRP: 8 mg/dL — ABNORMAL HIGH (ref <1.0)

## 2019-07-23 LAB — FERRITIN: Ferritin: 796 ng/mL — ABNORMAL HIGH (ref 24–336)

## 2019-07-23 LAB — MAGNESIUM: Magnesium: 2.2 mg/dL (ref 1.7–2.4)

## 2019-07-23 MED ORDER — PANTOPRAZOLE SODIUM 40 MG PO TBEC
40.0000 mg | DELAYED_RELEASE_TABLET | Freq: Every day | ORAL | Status: DC
  Administered 2019-07-23 – 2019-08-02 (×11): 40 mg via ORAL
  Filled 2019-07-23 (×11): qty 1

## 2019-07-23 MED ORDER — MELATONIN 5 MG PO TABS
5.0000 mg | ORAL_TABLET | Freq: Every evening | ORAL | Status: DC | PRN
  Administered 2019-07-23 – 2019-08-02 (×10): 5 mg via ORAL
  Filled 2019-07-23 (×12): qty 1

## 2019-07-23 NOTE — Progress Notes (Signed)
Patient ambulated to the restroom and oxygen saturation decreased upon getting back to bed. Placed patient on HFNC and titrated up to 10 lpm. Currently saturating at 92%. Instructions given on how to use incentive spirometer and instructed on the benefits of self-proning.

## 2019-07-23 NOTE — ED Notes (Signed)
Placed pt on bedside toilet.

## 2019-07-23 NOTE — ED Notes (Signed)
Lunch tray @ bedside.

## 2019-07-23 NOTE — Progress Notes (Signed)
[facility]  PROGRESS NOTE    [name]  GLO:[id number] DOB: [birth year] DOA: 07/22/2019 PCP: Tamsen Roers, MD   Brief Narrative:    [name] is a 43 y.o. male with medical history significant of hypertension, GERD, asthma presents to emergency department due to worsening cough, shortness of breath and fever since 1 week.   Patient tells me that he tested positive for COVID-19 3 days ago.  Has productive cough with greenish-brown sputum, associated with shortness of breath, wheezing chest pain when he coughs, fever, decreased appetite, generalized weakness, loose stool, lethargy. He tells me that his symptoms started getting worse since yesterday so he decided to come to the emergency department for further evaluation and management.  He has history of asthma however he does not use oxygen at home.  07/23/19: Requiring increased O2 from yesterday (up to 4L). No reported stools ON.   Assessment & Plan:   Principal Problem:   Acute hypoxemic respiratory failure due to COVID-19 [facility]) Active Problems:   GERD (gastroesophageal reflux disease)   Essential hypertension   Asthma   Thrombocytopenia (HCC)   Leukopenia   COVID-19  Acute hypoxemic respiratory failure Covid PNA Asthma     - COVID positive     - currently requiring 4L [place]     - inflamatory markers are largely flat     - continue remdes/solumedrol     - PRN albuterol, anti-tussives     - continue zinc and vitamin C.     - Patient was told that if COVID-19 pneumonitis gets worse we might potentially use Actemra off label, he denies any known history of tuberculosis or hepatitis, understands the risk and benefits and wants to proceed with Actemra treatment if required.  Hypertension     - Continue lisinopril, HCTZ     - BP acceptable  GERD     - protonix  Thrombocytopenia/leukopenia:     - check peripheral smear     - stable, no evidence of bleed     - monitor  Morbid obesity     - BMI 45.61     - counseld on  diet, exercise  DVT prophylaxis: lovenox Code Status: FULL   Disposition Plan: TBD  Antimicrobials:  . remdesivir   ROS:  Denies CP, N, V. Reports dyspnea, cough, anxiety. Remainder 10-pt ROS is negative for all not previously mentioned.  Subjective: "I can't stay here that long."  Objective: Vitals:   07/23/19 0500 07/23/19 0530 07/23/19 0603 07/23/19 1201  BP: 131/80 (!) 145/83 128/84 (!) 143/81  Pulse: (!) 102 80 84 87  Resp:  16 (!) 22 (!) 22  Temp:      TempSrc:      SpO2: (!) 72% 94% 92% 92%  Weight:        Intake/Output Summary (Last 24 hours) at 07/23/2019 1238 Last data filed at 07/23/2019 1884 Gross per 24 hour  Intake 289.33 ml  Output 300 ml  Net -10.67 ml   Filed Weights   07/22/19 1300  Weight: 136.1 kg    Examination:  General: 43 y.o. male resting in bed in NAD Cardiovascular: RRR, +S1, S2, no m/g/r, equal pulses throughout Respiratory: soft wheeze noted right side; normal WOB GI: BS+, NDNT, no masses noted, no organomegaly noted MSK: No e/c/c Neuro: A&O x 3, no focal deficits Psyc: Appropriate interaction and affect, calm/cooperative   Data Reviewed: I have personally reviewed following labs and imaging studies.  CBC: Recent Labs  Lab 07/22/19 1039  07/22/19 1254 07/23/19 0449  WBC 2.8* 3.2* 2.2*  NEUTROABS 1.7  --  1.4*  HGB 14.4 14.4 14.6  HCT 45.2 44.0 44.9  MCV 84.6 84.1 84.6  PLT 92* 94* 96*   Basic Metabolic Panel: Recent Labs  Lab 07/22/19 1039 07/22/19 1254 07/23/19 0449  NA 137  --  137  K 3.6  --  3.6  CL 97*  --  96*  CO2 27  --  30  GLUCOSE 94  --  156*  BUN 17  --  16  CREATININE 0.90 0.93 0.94  CALCIUM 8.3*  --  8.5*  MG  --   --  2.2  PHOS  --   --  4.0   GFR: Estimated Creatinine Clearance: 134 mL/min (by C-G formula based on SCr of 0.94 mg/dL). Liver Function Tests: Recent Labs  Lab 07/22/19 1039 07/23/19 0449  AST 58* 56*  ALT 36 40  ALKPHOS 50 49  BILITOT 0.3 0.3  PROT 7.1 6.9  ALBUMIN  3.3* 3.1*   No results for input(s): LIPASE, AMYLASE in the last 168 hours. No results for input(s): AMMONIA in the last 168 hours. Coagulation Profile: No results for input(s): INR, PROTIME in the last 168 hours. Cardiac Enzymes: No results for input(s): CKTOTAL, CKMB, CKMBINDEX, TROPONINI in the last 168 hours. BNP (last 3 results) No results for input(s): PROBNP in the last 8760 hours. HbA1C: No results for input(s): HGBA1C in the last 72 hours. CBG: No results for input(s): GLUCAP in the last 168 hours. Lipid Profile: Recent Labs    07/22/19 1038  TRIG 95   Thyroid Function Tests: No results for input(s): TSH, T4TOTAL, FREET4, T3FREE, THYROIDAB in the last 72 hours. Anemia Panel: Recent Labs    07/22/19 1039 07/23/19 0449  FERRITIN 588* 796*   Sepsis Labs: Recent Labs  Lab 07/22/19 1039 07/22/19 1315  PROCALCITON <0.10  --   LATICACIDVEN 1.2 1.1    Recent Results (from the past 240 hour(s))  Novel Coronavirus, NAA ([facility])     Status: Abnormal   Collection Time: 07/19/19 12:00 AM   Specimen: Nasopharyngeal(NP) swabs in vial transport medium   NASOPHARYNGE  TESTING  Result Value Ref Range Status   SARS-CoV-2, NAA Detected (A) Not Detected Final    Comment: This nucleic acid amplification test was developed and its performance characteristics determined by [employer]. Nucleic acid amplification tests include PCR and TMA. This test has not been FDA cleared or approved. This test has been authorized by FDA under an Emergency Use Authorization (EUA). This test is only authorized for the duration of time the declaration that circumstances exist justifying the authorization of the emergency use of in vitro diagnostic tests for detection of SARS-CoV-2 virus and/or diagnosis of COVID-19 infection under section 564(b)(1) of the Act, 21 U.S.C. 482NOI-3(B) (1), unless the authorization is terminated or revoked sooner. When diagnostic testing is negative, the  possibility of a false negative result should be considered in the context of a patient's recent exposures and the presence of clinical signs and symptoms consistent with COVID-19. An individual without symptoms of COVID-19 and who is not shedding SARS-CoV-2 virus would  expect to have a negative (not detected) result in this assay.   Culture, sputum-assessment     Status: None   Collection Time: 07/23/19 12:17 AM   Specimen: Sputum  Result Value Ref Range Status   Specimen Description SPUTUM  Final   Special Requests NONE  Final   Sputum evaluation  Final    THIS SPECIMEN IS ACCEPTABLE FOR SPUTUM CULTURE Performed at [facility], [place] [address] [place], [place] [address]    Report Status 07/23/2019 FINAL  Final  Culture, respiratory     Status: None (Preliminary result)   Collection Time: 07/23/19 12:17 AM   Specimen: SPU  Result Value Ref Range Status   Specimen Description SPUTUM  Final   Special Requests NONE Reflexed from [id number]  Final   Gram Stain   Final    FEW WBC PRESENT, PREDOMINANTLY PMN ABUNDANT GRAM NEGATIVE COCCOBACILLI RARE GRAM POSITIVE COCCI Performed at [facility], [place] [address], [place], [place] [address]    Culture PENDING  Incomplete   Report Status PENDING  Incomplete      Radiology Studies: DG Chest Portable 1 View  Result Date: 07/22/2019 CLINICAL DATA:  Hypoxia. COVID-19. Cough. Diarrhea and vomiting. EXAM: PORTABLE CHEST 1 VIEW COMPARISON:  None FINDINGS: There are faint infiltrates in the right midzone and at the right base laterally. Left lung is clear. Heart size and vascularity are normal. No effusions. No significant bone abnormality. IMPRESSION: Faint infiltrates in the right midzone and at the right lung base. The appearance is suspicious for pneumonia. Electronically Signed   By: Lorriane Shire M.D.   On: 07/22/2019 10:48     Scheduled Meds: . albuterol  2 puff Inhalation Q4H  . vitamin C  500 mg Oral Daily  .  enoxaparin (LOVENOX) injection  40 mg Subcutaneous Q24H  . methylPREDNISolone (SOLU-MEDROL) injection  0.5 mg/kg Intravenous Q12H  . zinc sulfate  220 mg Oral Daily   Continuous Infusions: . remdesivir 100 mg in NS 100 mL 100 mg (07/23/19 1151)     LOS: 1 day    Time spent: 25 minutes spent in the coordination of care today.    Jonnie Finner, DO [facility] Pager [phone]  If 7PM-7AM, please contact night-coverage [email or web address] Password [facility] 07/23/2019, 12:38 PM

## 2019-07-23 NOTE — Progress Notes (Signed)
1540 Pt arrived via stretcher from [facility] alert and oriented x4  Pt VSS currently on 4L per [place].  No chest pain or shortness of breath noted

## 2019-07-23 NOTE — ED Notes (Signed)
Breakfast tray @ bedside.

## 2019-07-23 NOTE — ED Notes (Signed)
Lunch Tray Ordered @ 2878.

## 2019-07-23 NOTE — ED Notes (Signed)
Pt requesting something for anxiety. MD aware and at bedside.

## 2019-07-23 NOTE — ED Notes (Signed)
[facility] called/Transfer to [facility] @ 1352-per [place] called by Levada Dy

## 2019-07-23 NOTE — Plan of Care (Signed)
  Problem: Education: Goal: Knowledge of risk factors and measures for prevention of condition will improve Outcome: Progressing   Problem: Coping: Goal: Psychosocial and spiritual needs will be supported Outcome: Progressing   Problem: Respiratory: Goal: Will maintain a patent airway Outcome: Progressing Goal: Complications related to the disease process, condition or treatment will be avoided or minimized Outcome: Progressing   

## 2019-07-23 NOTE — ED Notes (Signed)
[facility] called for transport to [facility]

## 2019-07-24 LAB — CBC WITH DIFFERENTIAL/PLATELET
Abs Immature Granulocytes: 0.04 10*3/uL (ref 0.00–0.07)
Basophils Absolute: 0 10*3/uL (ref 0.0–0.1)
Basophils Relative: 0 %
Eosinophils Absolute: 0 10*3/uL (ref 0.0–0.5)
Eosinophils Relative: 0 %
HCT: 45.5 % (ref 39.0–52.0)
Hemoglobin: 14.4 g/dL (ref 13.0–17.0)
Immature Granulocytes: 1 %
Lymphocytes Relative: 16 %
Lymphs Abs: 1 10*3/uL (ref 0.7–4.0)
MCH: 26.7 pg (ref 26.0–34.0)
MCHC: 31.6 g/dL (ref 30.0–36.0)
MCV: 84.4 fL (ref 80.0–100.0)
Monocytes Absolute: 0.4 10*3/uL (ref 0.1–1.0)
Monocytes Relative: 6 %
Neutro Abs: 4.9 10*3/uL (ref 1.7–7.7)
Neutrophils Relative %: 77 %
Platelets: 130 10*3/uL — ABNORMAL LOW (ref 150–400)
RBC: 5.39 MIL/uL (ref 4.22–5.81)
RDW: 13.2 % (ref 11.5–15.5)
WBC: 6.3 10*3/uL (ref 4.0–10.5)
nRBC: 0 % (ref 0.0–0.2)

## 2019-07-24 LAB — D-DIMER, QUANTITATIVE: D-Dimer, Quant: 0.43 ug/mL-FEU (ref 0.00–0.50)

## 2019-07-24 LAB — COMPREHENSIVE METABOLIC PANEL
ALT: 38 U/L (ref 0–44)
AST: 50 U/L — ABNORMAL HIGH (ref 15–41)
Albumin: 3.3 g/dL — ABNORMAL LOW (ref 3.5–5.0)
Alkaline Phosphatase: 47 U/L (ref 38–126)
Anion gap: 14 (ref 5–15)
BUN: 22 mg/dL — ABNORMAL HIGH (ref 6–20)
CO2: 28 mmol/L (ref 22–32)
Calcium: 8.8 mg/dL — ABNORMAL LOW (ref 8.9–10.3)
Chloride: 97 mmol/L — ABNORMAL LOW (ref 98–111)
Creatinine, Ser: 0.87 mg/dL (ref 0.61–1.24)
GFR calc Af Amer: >60 mL/min (ref >60)
GFR calc non Af Amer: >60 mL/min (ref >60)
Glucose, Bld: 165 mg/dL — ABNORMAL HIGH (ref 70–99)
Potassium: 3.7 mmol/L (ref 3.5–5.1)
Sodium: 139 mmol/L (ref 135–145)
Total Bilirubin: 0.8 mg/dL (ref 0.3–1.2)
Total Protein: 7.3 g/dL (ref 6.5–8.1)

## 2019-07-24 LAB — MAGNESIUM: Magnesium: 2.5 mg/dL — ABNORMAL HIGH (ref 1.7–2.4)

## 2019-07-24 LAB — PHOSPHORUS: Phosphorus: 4.1 mg/dL (ref 2.5–4.6)

## 2019-07-24 LAB — C-REACTIVE PROTEIN: CRP: 2.7 mg/dL — ABNORMAL HIGH (ref <1.0)

## 2019-07-24 LAB — FERRITIN: Ferritin: 750 ng/mL — ABNORMAL HIGH (ref 24–336)

## 2019-07-24 MED ORDER — TOCILIZUMAB 400 MG/20ML IV SOLN
800.0000 mg | Freq: Once | INTRAVENOUS | Status: AC
  Administered 2019-07-24: 12:00:00 800 mg via INTRAVENOUS
  Filled 2019-07-24: qty 40

## 2019-07-24 MED ORDER — ENOXAPARIN SODIUM 80 MG/0.8ML ~~LOC~~ SOLN
70.0000 mg | Freq: Every day | SUBCUTANEOUS | Status: DC
  Administered 2019-07-24 – 2019-08-03 (×11): 70 mg via SUBCUTANEOUS
  Filled 2019-07-24 (×11): qty 0.8

## 2019-07-24 NOTE — Progress Notes (Signed)
Not able to maintain 02 sats on HF with NRB ,both at 15%, pt compliant with resp treatments. Educated on proning and side to side sleeping positions,responded well to 02 changes, Sats 96%

## 2019-07-24 NOTE — Progress Notes (Signed)
[facility] PROGRESS NOTE    Progress Note  [name]  WIO:[id number] DOB: Aug 12, 1975 DOA: 07/22/2019 PCP: Tamsen Roers, MD     Brief Narrative:   [name] is an 43 y.o. male past medical history significant of hypertension, GERD presents to the ED with worsening cough and shortness of breath along with fever for 1 week prior to admission.  He relate he tested positive for his SARS-CoV-2 3 days prior to admission.  Assessment/Plan:   Acute respiratory failure with hypoxia due to COVID-19 viral pneumonia: Patient is requiring 10 L of high flow nasal cannula to keep saturations greater than 89%. His oxygen requirement worsened from 4 L to 10 L overnight. He was started empirically on IV Solu-Medrol and IV remdesivir on 07/22/2019 Continue vitamin C and zinc. Try to keep the patient peripherally 16 hours a day, when not prone out of bed to chair. The treatment plan and use of medications (Actemra)and known side effects were discussed with patient/family, they were clearly explained that there is no proven definitive treatment for COVID-19 infection, any medications used here are based on published clinical articles/anecdotal data which are not peer-reviewed or randomized control trials.  Complete risks and long-term side effects are unknown, however in the best clinical judgment they seem to be of some clinical benefit rather than medical risks.  Patient/family agree with the treatment plan and want to receive the given medications. He would like to talk to his family about other medications like convalescent plasma before agreeing to it.  Essential hypertension: Blood pressure fairly controlled.  Thrombocytopenia/Leukopenia Likely due to COVID-19 they are both improving this morning.  DVT prophylaxis: lovenox Family Communication:none Disposition Plan/Barrier to D/C: Unable to determine. Code Status:     Code Status Orders  (From admission, onward)          Start     Ordered   07/22/19 1301  Full code  Continuous     07/22/19 1302        Code Status History    Date Active Date Inactive Code Status Order ID Comments User Context   09/29/2017 1636 10/02/2017 1959 Full Code [id number]  Leandrew Koyanagi, MD Inpatient   04/18/2017 0042 04/21/2017 1909 Full Code [id number]  Edwin Dada, MD Inpatient   Advance Care Planning Activity        IV Access:    Peripheral IV   Procedures and diagnostic studies:   DG Chest Portable 1 View  Result Date: 07/22/2019 CLINICAL DATA:  Hypoxia. COVID-19. Cough. Diarrhea and vomiting. EXAM: PORTABLE CHEST 1 VIEW COMPARISON:  None FINDINGS: There are faint infiltrates in the right midzone and at the right base laterally. Left lung is clear. Heart size and vascularity are normal. No effusions. No significant bone abnormality. IMPRESSION: Faint infiltrates in the right midzone and at the right lung base. The appearance is suspicious for pneumonia. Electronically Signed   By: Lorriane Shire M.D.   On: 07/22/2019 10:48     Medical Consultants:    None.  Anti-Infectives:   IV remdesivir.  Subjective:    [name] he relates his breathing is better than yesterday.  Objective:    Vitals:   07/23/19 1600 07/23/19 2123 07/23/19 2328 07/24/19 0009  BP: 137/77 114/79  118/66  Pulse: 84 71 74 74  Resp: 18 18 19  (!) 21  Temp: 98.5 F (36.9 C) 97.6 F (36.4 C)  98.5 F (36.9 C)  TempSrc:  Oral  Oral  SpO2:  92% 92% 92% 95%  Weight:       SpO2: 95 % O2 Flow Rate (L/min): 10 L/min   Intake/Output Summary (Last 24 hours) at 07/24/2019 5732 Last data filed at 07/23/2019 1600 Gross per 24 hour  Intake 120 ml  Output 400 ml  Net -280 ml   Filed Weights   07/22/19 1300  Weight: 136.1 kg    Exam: General exam: In no acute distress. Respiratory system: Good air movement and diffuse crackles bilaterally. Cardiovascular system: S1 & S2 heard, RRR. No JVD.  Gastrointestinal system:  Abdomen is nondistended, soft and nontender.  Central nervous system: Alert and oriented. No focal neurological deficits. Extremities: No pedal edema. Skin: No rashes, lesions or ulcers Psychiatry: Judgement and insight appear normal. Mood & affect appropriate.    Data Reviewed:    Labs: Basic Metabolic Panel: Recent Labs  Lab 07/22/19 1039 07/22/19 1254 07/23/19 0449 07/24/19 0450  NA 137  --  137 139  K 3.6  --  3.6 3.7  CL 97*  --  96* 97*  CO2 27  --  30 28  GLUCOSE 94  --  156* 165*  BUN 17  --  16 22*  CREATININE 0.90 0.93 0.94 0.87  CALCIUM 8.3*  --  8.5* 8.8*  MG  --   --  2.2 2.5*  PHOS  --   --  4.0 4.1   GFR Estimated Creatinine Clearance: 144.8 mL/min (by C-G formula based on SCr of 0.87 mg/dL). Liver Function Tests: Recent Labs  Lab 07/22/19 1039 07/23/19 0449 07/24/19 0450  AST 58* 56* 50*  ALT 36 40 38  ALKPHOS 50 49 47  BILITOT 0.3 0.3 0.8  PROT 7.1 6.9 7.3  ALBUMIN 3.3* 3.1* 3.3*   No results for input(s): LIPASE, AMYLASE in the last 168 hours. No results for input(s): AMMONIA in the last 168 hours. Coagulation profile No results for input(s): INR, PROTIME in the last 168 hours. COVID-19 Labs  Recent Labs    07/22/19 1039 07/23/19 0449  DDIMER 0.57*  --   FERRITIN 588* 796*  LDH 514*  --   CRP 9.3* 8.0*    Lab Results  Component Value Date   SARSCOV2NAA Detected (A) 07/19/2019   SARSCOV2NAA NOT DETECTED 04/29/2019   [place] NEGATIVE 04/17/2019    CBC: Recent Labs  Lab 07/22/19 1039 07/22/19 1254 07/23/19 0449 07/24/19 0450  WBC 2.8* 3.2* 2.2* 6.3  NEUTROABS 1.7  --  1.4* 4.9  HGB 14.4 14.4 14.6 14.4  HCT 45.2 44.0 44.9 45.5  MCV 84.6 84.1 84.6 84.4  PLT 92* 94* 96* 130*   Cardiac Enzymes: No results for input(s): CKTOTAL, CKMB, CKMBINDEX, TROPONINI in the last 168 hours. BNP (last 3 results) No results for input(s): PROBNP in the last 8760 hours. CBG: No results for input(s): GLUCAP in the last 168  hours. D-Dimer: Recent Labs    07/22/19 1039  DDIMER 0.57*   Hgb A1c: No results for input(s): HGBA1C in the last 72 hours. Lipid Profile: Recent Labs    07/22/19 1038  TRIG 95   Thyroid function studies: No results for input(s): TSH, T4TOTAL, T3FREE, THYROIDAB in the last 72 hours.  Invalid input(s): FREET3 Anemia work up: Recent Labs    07/22/19 1039 07/23/19 0449  FERRITIN 588* 796*   Sepsis Labs: Recent Labs  Lab 07/22/19 1039 07/22/19 1254 07/22/19 1315 07/23/19 0449 07/24/19 0450  PROCALCITON <0.10  --   --   --   --  WBC 2.8* 3.2*  --  2.2* 6.3  LATICACIDVEN 1.2  --  1.1  --   --    Microbiology Recent Results (from the past 240 hour(s))  Novel Coronavirus, NAA ([facility])     Status: Abnormal   Collection Time: 07/19/19 12:00 AM   Specimen: Nasopharyngeal(NP) swabs in vial transport medium   NASOPHARYNGE  TESTING  Result Value Ref Range Status   SARS-CoV-2, NAA Detected (A) Not Detected Final    Comment: This nucleic acid amplification test was developed and its performance characteristics determined by [employer]. Nucleic acid amplification tests include PCR and TMA. This test has not been FDA cleared or approved. This test has been authorized by FDA under an Emergency Use Authorization (EUA). This test is only authorized for the duration of time the declaration that circumstances exist justifying the authorization of the emergency use of in vitro diagnostic tests for detection of SARS-CoV-2 virus and/or diagnosis of COVID-19 infection under section 564(b)(1) of the Act, 21 U.S.C. 735HGD-9(M) (1), unless the authorization is terminated or revoked sooner. When diagnostic testing is negative, the possibility of a false negative result should be considered in the context of a patient's recent exposures and the presence of clinical signs and symptoms consistent with COVID-19. An individual without symptoms of COVID-19 and who is not shedding  SARS-CoV-2 virus would  expect to have a negative (not detected) result in this assay.   Blood Culture (routine x 2)     Status: None (Preliminary result)   Collection Time: 07/22/19 11:57 AM   Specimen: BLOOD  Result Value Ref Range Status   Specimen Description BLOOD SITE NOT SPECIFIED  Final   Special Requests   Final    BOTTLES DRAWN AEROBIC AND ANAEROBIC Blood Culture adequate volume   Culture   Final    NO GROWTH 1 DAY Performed at [facility], [place] [address], [name], [place] [address]    Report Status PENDING  Incomplete  Culture, sputum-assessment     Status: None   Collection Time: 07/23/19 12:17 AM   Specimen: Sputum  Result Value Ref Range Status   Specimen Description SPUTUM  Final   Special Requests NONE  Final   Sputum evaluation   Final    THIS SPECIMEN IS ACCEPTABLE FOR SPUTUM CULTURE Performed at [facility], [address] [place], [place] [address]    Report Status 07/23/2019 FINAL  Final  Culture, respiratory     Status: None (Preliminary result)   Collection Time: 07/23/19 12:17 AM   Specimen: SPU  Result Value Ref Range Status   Specimen Description SPUTUM  Final   Special Requests NONE Reflexed from [id number]  Final   Gram Stain   Final    FEW WBC PRESENT, PREDOMINANTLY PMN ABUNDANT GRAM NEGATIVE COCCOBACILLI RARE GRAM POSITIVE COCCI Performed at [facility], [place] [address], [place], [place] [address]    Culture PENDING  Incomplete   Report Status PENDING  Incomplete     Medications:   . albuterol  2 puff Inhalation Q4H  . vitamin C  500 mg Oral Daily  . enoxaparin (LOVENOX) injection  40 mg Subcutaneous Q24H  . methylPREDNISolone (SOLU-MEDROL) injection  0.5 mg/kg Intravenous Q12H  . pantoprazole  40 mg Oral Daily  . zinc sulfate  220 mg Oral Daily   Continuous Infusions: . remdesivir 100 mg in NS 100 mL Stopped (07/23/19 1333)      LOS: 2 days   Charlynne Cousins  [facility]  07/24/2019, 7:12 AM

## 2019-07-24 NOTE — Progress Notes (Signed)
Pt mother updated and all questions answered.

## 2019-07-24 NOTE — Progress Notes (Signed)
Attempted to call pt wife and mother but no answer. Will try again later.

## 2019-07-25 LAB — CBC WITH DIFFERENTIAL/PLATELET
Abs Immature Granulocytes: 0.04 10*3/uL (ref 0.00–0.07)
Basophils Absolute: 0 10*3/uL (ref 0.0–0.1)
Basophils Relative: 0 %
Eosinophils Absolute: 0 10*3/uL (ref 0.0–0.5)
Eosinophils Relative: 0 %
HCT: 45.2 % (ref 39.0–52.0)
Hemoglobin: 14.3 g/dL (ref 13.0–17.0)
Immature Granulocytes: 1 %
Lymphocytes Relative: 16 %
Lymphs Abs: 0.8 10*3/uL (ref 0.7–4.0)
MCH: 27 pg (ref 26.0–34.0)
MCHC: 31.6 g/dL (ref 30.0–36.0)
MCV: 85.3 fL (ref 80.0–100.0)
Monocytes Absolute: 0.3 10*3/uL (ref 0.1–1.0)
Monocytes Relative: 7 %
Neutro Abs: 3.7 10*3/uL (ref 1.7–7.7)
Neutrophils Relative %: 76 %
Platelets: 129 10*3/uL — ABNORMAL LOW (ref 150–400)
RBC: 5.3 MIL/uL (ref 4.22–5.81)
RDW: 13.2 % (ref 11.5–15.5)
WBC: 4.9 10*3/uL (ref 4.0–10.5)
nRBC: 0 % (ref 0.0–0.2)

## 2019-07-25 LAB — MAGNESIUM: Magnesium: 2.5 mg/dL — ABNORMAL HIGH (ref 1.7–2.4)

## 2019-07-25 LAB — CULTURE, RESPIRATORY W GRAM STAIN: Culture: NORMAL

## 2019-07-25 LAB — COMPREHENSIVE METABOLIC PANEL
ALT: 33 U/L (ref 0–44)
AST: 40 U/L (ref 15–41)
Albumin: 3.1 g/dL — ABNORMAL LOW (ref 3.5–5.0)
Alkaline Phosphatase: 49 U/L (ref 38–126)
Anion gap: 13 (ref 5–15)
BUN: 27 mg/dL — ABNORMAL HIGH (ref 6–20)
CO2: 29 mmol/L (ref 22–32)
Calcium: 8.7 mg/dL — ABNORMAL LOW (ref 8.9–10.3)
Chloride: 97 mmol/L — ABNORMAL LOW (ref 98–111)
Creatinine, Ser: 0.75 mg/dL (ref 0.61–1.24)
GFR calc Af Amer: >60 mL/min (ref >60)
GFR calc non Af Amer: >60 mL/min (ref >60)
Glucose, Bld: 171 mg/dL — ABNORMAL HIGH (ref 70–99)
Potassium: 4 mmol/L (ref 3.5–5.1)
Sodium: 139 mmol/L (ref 135–145)
Total Bilirubin: 0.6 mg/dL (ref 0.3–1.2)
Total Protein: 6.8 g/dL (ref 6.5–8.1)

## 2019-07-25 LAB — GLUCOSE, CAPILLARY
Glucose-Capillary: 168 mg/dL — ABNORMAL HIGH (ref 70–99)
Glucose-Capillary: 157 mg/dL — ABNORMAL HIGH (ref 70–99)
Glucose-Capillary: 171 mg/dL — ABNORMAL HIGH (ref 70–99)

## 2019-07-25 LAB — PHOSPHORUS: Phosphorus: 3.5 mg/dL (ref 2.5–4.6)

## 2019-07-25 LAB — C-REACTIVE PROTEIN: CRP: 0.9 mg/dL (ref <1.0)

## 2019-07-25 LAB — D-DIMER, QUANTITATIVE: D-Dimer, Quant: 0.33 ug/mL-FEU (ref 0.00–0.50)

## 2019-07-25 LAB — ABO/RH: ABO/RH(D): O POS

## 2019-07-25 LAB — HEMOGLOBIN A1C
Hgb A1c MFr Bld: 6.7 % — ABNORMAL HIGH (ref 4.8–5.6)
Mean Plasma Glucose: 145.59 mg/dL

## 2019-07-25 MED ORDER — LORAZEPAM 2 MG/ML IJ SOLN
0.5000 mg | Freq: Once | INTRAMUSCULAR | Status: AC
  Administered 2019-07-25: 01:00:00 0.5 mg via INTRAVENOUS
  Filled 2019-07-25: qty 1

## 2019-07-25 MED ORDER — INSULIN ASPART 100 UNIT/ML ~~LOC~~ SOLN
0.0000 [IU] | Freq: Every day | SUBCUTANEOUS | Status: DC
  Administered 2019-07-28: 2 [IU] via SUBCUTANEOUS

## 2019-07-25 MED ORDER — INSULIN ASPART 100 UNIT/ML ~~LOC~~ SOLN
0.0000 [IU] | Freq: Three times a day (TID) | SUBCUTANEOUS | Status: DC
  Administered 2019-07-25 (×2): 3 [IU] via SUBCUTANEOUS
  Administered 2019-07-26: 12:00:00 2 [IU] via SUBCUTANEOUS
  Administered 2019-07-26: 08:00:00 8 [IU] via SUBCUTANEOUS
  Administered 2019-07-26 – 2019-07-27 (×2): 2 [IU] via SUBCUTANEOUS
  Administered 2019-07-27: 14:00:00 5 [IU] via SUBCUTANEOUS
  Administered 2019-07-28 – 2019-07-31 (×7): 2 [IU] via SUBCUTANEOUS
  Administered 2019-08-01: 3 [IU] via SUBCUTANEOUS
  Administered 2019-08-02 – 2019-08-03 (×3): 2 [IU] via SUBCUTANEOUS

## 2019-07-25 MED ORDER — DEXAMETHASONE SODIUM PHOSPHATE 10 MG/ML IJ SOLN
6.0000 mg | Freq: Two times a day (BID) | INTRAMUSCULAR | Status: DC
  Administered 2019-07-25 – 2019-07-29 (×9): 6 mg via INTRAVENOUS
  Filled 2019-07-25 (×9): qty 1

## 2019-07-25 MED ORDER — SODIUM CHLORIDE 0.9% IV SOLUTION: Freq: Once | INTRAVENOUS | Status: AC

## 2019-07-25 NOTE — Progress Notes (Signed)
Transferred patient internally to room 169, under ICU nurse care, on progressive unit for heated high flow per MD.  Bedside shift report given to Pryor Montes RN.

## 2019-07-25 NOTE — Progress Notes (Addendum)
[facility] PROGRESS NOTE    Progress Note  [name]  WUJ:[id number] DOB: [birth year] DOA: 07/22/2019 PCP: Tamsen Roers, MD     Brief Narrative:   [name] is an 43 y.o. male past medical history significant of hypertension, GERD presents to the ED with worsening cough and shortness of breath along with fever for 1 week prior to admission.  He relate he tested positive for his SARS-CoV-2 3 days prior to admission.  Assessment/Plan:   Acute respiratory failure with hypoxia due to COVID-19 viral pneumonia: Patient is still requiring 15 L of high flow nasal cannula plus nonrebreather to keep saturations greater 89%. We will place him on a heated high flow to try to keep saturations greater than 90%. Continue IV Solu-Medrol and IV remdesivir started on 07/22/2019. Continue vitamin C and zinc. Try to keep the patient peripherally 16 hours a day, if not prone out of bed to chair. Patient agreed to receive Actemra which was given on 16 2020. He agreed to convalescent plasma today. The treatment plan and use of medications and known side effects were discussed with patient/family, they were clearly explained that there is no proven definitive treatment for COVID-19 infection, any medications used here are based on published clinical articles/anecdotal data which are not peer-reviewed or randomized control trials.  Complete risks and long-term side effects are unknown, however in the best clinical judgment they seem to be of some clinical benefit rather than medical risks.  Patient/family agree with the treatment plan and want to receive the given medications.   Essential hypertension: Blood pressure fairly controlled.  Thrombocytopenia/Leukopenia Likely due to COVID-19 they are both improving this morning.  DVT prophylaxis: lovenox Family Communication:none Disposition Plan/Barrier to D/C: Unable to determine. Code Status:     Code Status Orders  (From admission,  onward)         Start     Ordered   07/22/19 1301  Full code  Continuous     07/22/19 1302        Code Status History    Date Active Date Inactive Code Status Order ID Comments User Context   09/29/2017 1636 10/02/2017 1959 Full Code [id number]  Leandrew Koyanagi, MD Inpatient   04/18/2017 0042 04/21/2017 1909 Full Code [id number]  Edwin Dada, MD Inpatient   Advance Care Planning Activity        IV Access:    Peripheral IV   Procedures and diagnostic studies:   No results found.   Medical Consultants:    None.  Anti-Infectives:   IV remdesivir.  Subjective:    [name] he relates his breathing is not better than yesterday, had a bad night and was not able to sleep.  Objective:    Vitals:   07/25/19 0200 07/25/19 0300 07/25/19 0400 07/25/19 0501  BP:    (!) 151/74  Pulse: 61 67 63 78  Resp: (!) 22 (!) 22 (!) 23 (!) 22  Temp:    97.6 F (36.4 C)  TempSrc:    Oral  SpO2: 93% 96% 96% 95%  Weight:       SpO2: 95 % O2 Flow Rate (L/min): 10 L/min   Intake/Output Summary (Last 24 hours) at 07/25/2019 0730 Last data filed at 07/24/2019 2340 Gross per 24 hour  Intake 380 ml  Output 925 ml  Net -545 ml   Filed Weights   07/22/19 1300  Weight: 136.1 kg    Exam: General exam: Appears tired breathing about  25-28 times per minute, not using accessory muscles to breathe. Respiratory system: Good air movement and diffuse crackles bilaterally. Cardiovascular system: S1 & S2 heard, RRR. No JVD. Gastrointestinal system: Abdomen is nondistended, soft and nontender.  Central nervous system: Alert and oriented. No focal neurological deficits. Extremities: No pedal edema. Skin: No rashes, lesions or ulcers Psychiatry: Judgement and insight appear normal. Mood & affect appropriate.   Data Reviewed:    Labs: Basic Metabolic Panel: Recent Labs  Lab 07/22/19 1039 07/22/19 1254 07/23/19 0449 07/24/19 0450  NA 137  --  137 139  K 3.6  --  3.6  3.7  CL 97*  --  96* 97*  CO2 27  --  30 28  GLUCOSE 94  --  156* 165*  BUN 17  --  16 22*  CREATININE 0.90 0.93 0.94 0.87  CALCIUM 8.3*  --  8.5* 8.8*  MG  --   --  2.2 2.5*  PHOS  --   --  4.0 4.1   GFR Estimated Creatinine Clearance: 144.8 mL/min (by C-G formula based on SCr of 0.87 mg/dL). Liver Function Tests: Recent Labs  Lab 07/22/19 1039 07/23/19 0449 07/24/19 0450  AST 58* 56* 50*  ALT 36 40 38  ALKPHOS 50 49 47  BILITOT 0.3 0.3 0.8  PROT 7.1 6.9 7.3  ALBUMIN 3.3* 3.1* 3.3*   No results for input(s): LIPASE, AMYLASE in the last 168 hours. No results for input(s): AMMONIA in the last 168 hours. Coagulation profile No results for input(s): INR, PROTIME in the last 168 hours. COVID-19 Labs  Recent Labs    07/22/19 1039 07/23/19 0449 07/24/19 0450  DDIMER 0.57*  --  0.43  FERRITIN 588* 796* 750*  LDH 514*  --   --   CRP 9.3* 8.0* 2.7*    Lab Results  Component Value Date   SARSCOV2NAA Detected (A) 07/19/2019   SARSCOV2NAA NOT DETECTED 04/29/2019   [place] NEGATIVE 04/17/2019    CBC: Recent Labs  Lab 07/22/19 1039 07/22/19 1254 07/23/19 0449 07/24/19 0450 07/25/19 0536  WBC 2.8* 3.2* 2.2* 6.3 4.9  NEUTROABS 1.7  --  1.4* 4.9 PENDING  HGB 14.4 14.4 14.6 14.4 14.3  HCT 45.2 44.0 44.9 45.5 45.2  MCV 84.6 84.1 84.6 84.4 85.3  PLT 92* 94* 96* 130* 129*   Cardiac Enzymes: No results for input(s): CKTOTAL, CKMB, CKMBINDEX, TROPONINI in the last 168 hours. BNP (last 3 results) No results for input(s): PROBNP in the last 8760 hours. CBG: No results for input(s): GLUCAP in the last 168 hours. D-Dimer: Recent Labs    07/22/19 1039 07/24/19 0450  DDIMER 0.57* 0.43   Hgb A1c: No results for input(s): HGBA1C in the last 72 hours. Lipid Profile: Recent Labs    07/22/19 1038  TRIG 95   Thyroid function studies: No results for input(s): TSH, T4TOTAL, T3FREE, THYROIDAB in the last 72 hours.  Invalid input(s): FREET3 Anemia work  up: Recent Labs    07/23/19 0449 07/24/19 0450  FERRITIN 796* 750*   Sepsis Labs: Recent Labs  Lab 07/22/19 1039 07/22/19 1254 07/22/19 1315 07/23/19 0449 07/24/19 0450 07/25/19 0536  PROCALCITON <0.10  --   --   --   --   --   WBC 2.8* 3.2*  --  2.2* 6.3 4.9  LATICACIDVEN 1.2  --  1.1  --   --   --    Microbiology Recent Results (from the past 240 hour(s))  Novel Coronavirus, NAA ([facility])  Status: Abnormal   Collection Time: 07/19/19 12:00 AM   Specimen: Nasopharyngeal(NP) swabs in vial transport medium   NASOPHARYNGE  TESTING  Result Value Ref Range Status   SARS-CoV-2, NAA Detected (A) Not Detected Final    Comment: This nucleic acid amplification test was developed and its performance characteristics determined by [employer]. Nucleic acid amplification tests include PCR and TMA. This test has not been FDA cleared or approved. This test has been authorized by FDA under an Emergency Use Authorization (EUA). This test is only authorized for the duration of time the declaration that circumstances exist justifying the authorization of the emergency use of in vitro diagnostic tests for detection of SARS-CoV-2 virus and/or diagnosis of COVID-19 infection under section 564(b)(1) of the Act, 21 U.S.C. 637CHY-8(F) (1), unless the authorization is terminated or revoked sooner. When diagnostic testing is negative, the possibility of a false negative result should be considered in the context of a patient's recent exposures and the presence of clinical signs and symptoms consistent with COVID-19. An individual without symptoms of COVID-19 and who is not shedding SARS-CoV-2 virus would  expect to have a negative (not detected) result in this assay.   Blood Culture (routine x 2)     Status: None (Preliminary result)   Collection Time: 07/22/19 11:57 AM   Specimen: BLOOD  Result Value Ref Range Status   Specimen Description BLOOD SITE NOT SPECIFIED  Final    Special Requests   Final    BOTTLES DRAWN AEROBIC AND ANAEROBIC Blood Culture adequate volume   Culture   Final    NO GROWTH 2 DAYS Performed at [facility], [address], [place], [place] [address]    Report Status PENDING  Incomplete  Culture, sputum-assessment     Status: None   Collection Time: 07/23/19 12:17 AM   Specimen: Sputum  Result Value Ref Range Status   Specimen Description SPUTUM  Final   Special Requests NONE  Final   Sputum evaluation   Final    THIS SPECIMEN IS ACCEPTABLE FOR SPUTUM CULTURE Performed at [facility], [address] [place], [place] [address]    Report Status 07/23/2019 FINAL  Final  Culture, respiratory     Status: None (Preliminary result)   Collection Time: 07/23/19 12:17 AM   Specimen: SPU  Result Value Ref Range Status   Specimen Description SPUTUM  Final   Special Requests NONE Reflexed from [id number]  Final   Gram Stain   Final    FEW WBC PRESENT, PREDOMINANTLY PMN ABUNDANT GRAM NEGATIVE COCCOBACILLI RARE GRAM POSITIVE COCCI    Culture   Final    CULTURE REINCUBATED FOR BETTER GROWTH Performed at [facility], [place] [address] [place], [place] [address]    Report Status PENDING  Incomplete     Medications:   . albuterol  2 puff Inhalation Q4H  . vitamin C  500 mg Oral Daily  . enoxaparin (LOVENOX) injection  70 mg Subcutaneous Daily  . methylPREDNISolone (SOLU-MEDROL) injection  0.5 mg/kg Intravenous Q12H  . pantoprazole  40 mg Oral Daily  . zinc sulfate  220 mg Oral Daily   Continuous Infusions: . remdesivir 100 mg in NS 100 mL 100 mg (07/24/19 0947)      LOS: 3 days   Charlynne Cousins  [facility]  07/25/2019, 7:30 AM

## 2019-07-26 LAB — PREPARE FRESH FROZEN PLASMA: Unit division: 0

## 2019-07-26 LAB — CBC WITH DIFFERENTIAL/PLATELET
Abs Immature Granulocytes: 0.07 10*3/uL (ref 0.00–0.07)
Basophils Absolute: 0 10*3/uL (ref 0.0–0.1)
Basophils Relative: 0 %
Eosinophils Absolute: 0 10*3/uL (ref 0.0–0.5)
Eosinophils Relative: 0 %
HCT: 43.4 % (ref 39.0–52.0)
Hemoglobin: 13.7 g/dL (ref 13.0–17.0)
Immature Granulocytes: 2 %
Lymphocytes Relative: 16 %
Lymphs Abs: 0.8 10*3/uL (ref 0.7–4.0)
MCH: 26.9 pg (ref 26.0–34.0)
MCHC: 31.6 g/dL (ref 30.0–36.0)
MCV: 85.3 fL (ref 80.0–100.0)
Monocytes Absolute: 0.4 10*3/uL (ref 0.1–1.0)
Monocytes Relative: 9 %
Neutro Abs: 3.5 10*3/uL (ref 1.7–7.7)
Neutrophils Relative %: 73 %
Platelets: 136 10*3/uL — ABNORMAL LOW (ref 150–400)
RBC: 5.09 MIL/uL (ref 4.22–5.81)
RDW: 13.1 % (ref 11.5–15.5)
WBC: 4.8 10*3/uL (ref 4.0–10.5)
nRBC: 0 % (ref 0.0–0.2)

## 2019-07-26 LAB — GLUCOSE, CAPILLARY
Glucose-Capillary: 130 mg/dL — ABNORMAL HIGH (ref 70–99)
Glucose-Capillary: 138 mg/dL — ABNORMAL HIGH (ref 70–99)
Glucose-Capillary: 139 mg/dL — ABNORMAL HIGH (ref 70–99)

## 2019-07-26 LAB — COMPREHENSIVE METABOLIC PANEL
ALT: 34 U/L (ref 0–44)
AST: 34 U/L (ref 15–41)
Albumin: 3.4 g/dL — ABNORMAL LOW (ref 3.5–5.0)
Alkaline Phosphatase: 50 U/L (ref 38–126)
Anion gap: 12 (ref 5–15)
BUN: 30 mg/dL — ABNORMAL HIGH (ref 6–20)
CO2: 30 mmol/L (ref 22–32)
Calcium: 8.6 mg/dL — ABNORMAL LOW (ref 8.9–10.3)
Chloride: 100 mmol/L (ref 98–111)
Creatinine, Ser: 0.8 mg/dL (ref 0.61–1.24)
GFR calc Af Amer: >60 mL/min (ref >60)
GFR calc non Af Amer: >60 mL/min (ref >60)
Glucose, Bld: 168 mg/dL — ABNORMAL HIGH (ref 70–99)
Potassium: 3.9 mmol/L (ref 3.5–5.1)
Sodium: 142 mmol/L (ref 135–145)
Total Bilirubin: 0.8 mg/dL (ref 0.3–1.2)
Total Protein: 7 g/dL (ref 6.5–8.1)

## 2019-07-26 LAB — BPAM FFP
Blood Product Expiration Date: 202012181712
ISSUE DATE / TIME: 202012171717
Unit Type and Rh: 5100

## 2019-07-26 LAB — D-DIMER, QUANTITATIVE: D-Dimer, Quant: 0.5 ug/mL-FEU (ref 0.00–0.50)

## 2019-07-26 LAB — MAGNESIUM: Magnesium: 2.7 mg/dL — ABNORMAL HIGH (ref 1.7–2.4)

## 2019-07-26 LAB — PHOSPHORUS: Phosphorus: 3.5 mg/dL (ref 2.5–4.6)

## 2019-07-26 LAB — C-REACTIVE PROTEIN: CRP: 0.7 mg/dL (ref <1.0)

## 2019-07-26 MED ORDER — FUROSEMIDE 10 MG/ML IJ SOLN: 40.0000 mg | Freq: Once | INTRAMUSCULAR | Status: DC

## 2019-07-26 MED ORDER — SODIUM CHLORIDE 0.9 % IV SOLN
100.0000 mg | Freq: Every day | INTRAVENOUS | Status: AC
  Administered 2019-07-27 – 2019-07-31 (×5): 100 mg via INTRAVENOUS
  Filled 2019-07-26 (×5): qty 20

## 2019-07-26 MED ORDER — FUROSEMIDE 10 MG/ML IJ SOLN
40.0000 mg | Freq: Once | INTRAMUSCULAR | Status: AC
  Administered 2019-07-26: 40 mg via INTRAVENOUS
  Filled 2019-07-26: qty 4

## 2019-07-26 MED ORDER — FUROSEMIDE 10 MG/ML IJ SOLN
40.0000 mg | Freq: Once | INTRAMUSCULAR | Status: AC
  Administered 2019-07-26: 15:00:00 40 mg via INTRAVENOUS
  Filled 2019-07-26: qty 4

## 2019-07-26 MED ORDER — FUROSEMIDE 10 MG/ML IJ SOLN: 20.0000 mg | Freq: Once | INTRAMUSCULAR | Status: DC

## 2019-07-26 MED ORDER — POTASSIUM CHLORIDE CRYS ER 20 MEQ PO TBCR
40.0000 meq | EXTENDED_RELEASE_TABLET | Freq: Two times a day (BID) | ORAL | Status: AC
  Administered 2019-07-26 (×2): 40 meq via ORAL
  Filled 2019-07-26 (×2): qty 2

## 2019-07-26 MED ORDER — LORAZEPAM 0.5 MG PO TABS
0.5000 mg | ORAL_TABLET | Freq: Two times a day (BID) | ORAL | Status: DC | PRN
  Administered 2019-07-26 – 2019-07-28 (×5): 0.5 mg via ORAL
  Filled 2019-07-26 (×5): qty 1

## 2019-07-26 NOTE — Progress Notes (Signed)
[facility] PROGRESS NOTE    Progress Note  [name]  DEY:[id number] DOB: [birth year] DOA: 07/22/2019 PCP: Tamsen Roers, MD     Brief Narrative:   [name] is an 43 y.o. male past medical history significant of hypertension, GERD presents to the ED with worsening cough and shortness of breath along with fever for 1 week prior to admission.  He relate he tested positive for his SARS-CoV-2 3 days prior to admission.  Assessment/Plan:   Acute respiratory failure with hypoxia due to COVID-19 viral pneumonia: Patient is requiring 15 L of high flow nasal cannula and a nonrebreather to keep saturations greater than 88% Continue IV remdesivir and steroids that were started on 07/22/2019.  Continue vitamin C and zinc He is status post convalescent plasma and IV Actemra on 07/25/2019. His inflammatory markers are significantly improved.  I expect his oxygen saturation start improving and sometime soon.  Essential hypertension: Blood pressure fairly controlled.  Thrombocytopenia/Leukopenia Likely due to COVID-19 they are both improving this morning.  DVT prophylaxis: lovenox Family Communication:none Disposition Plan/Barrier to D/C: Unable to determine. Code Status:     Code Status Orders  (From admission, onward)         Start     Ordered   07/22/19 1301  Full code  Continuous     07/22/19 1302        Code Status History    Date Active Date Inactive Code Status Order ID Comments User Context   09/29/2017 1636 10/02/2017 1959 Full Code [id number]  Leandrew Koyanagi, MD Inpatient   04/18/2017 0042 04/21/2017 1909 Full Code [id number]  Edwin Dada, MD Inpatient   Advance Care Planning Activity        IV Access:    Peripheral IV   Procedures and diagnostic studies:   No results found.   Medical Consultants:    None.  Anti-Infectives:   IV remdesivir.  Subjective:    [name] he relates his breathing continues to be poor, he is  very nervous anxious  Objective:    Vitals:   07/26/19 0400 07/26/19 0500 07/26/19 0600 07/26/19 0700  BP: 130/67 116/60 (!) 143/82 132/71  Pulse: 72 61 72 (!) 57  Resp:      Temp: 98.4 F (36.9 C)     TempSrc: Oral     SpO2: 93% 99% 97% 98%  Weight:       SpO2: 98 % O2 Flow Rate (L/min): 15 L/min FiO2 (%): 98 %   Intake/Output Summary (Last 24 hours) at 07/26/2019 0745 Last data filed at 07/25/2019 2100 Gross per 24 hour  Intake 1901 ml  Output 1400 ml  Net 501 ml   Filed Weights   07/22/19 1300  Weight: 136.1 kg    Exam: General exam: It appears his breathing is 20-25 times, not using accessory muscles to breathe, he looks more comfortable than yesterday Respiratory system: Good air movement with crackles bilaterally. Cardiovascular system: S1 & S2 heard, RRR. No JVD.  Gastrointestinal system: Abdomen is nondistended, soft and nontender.  Central nervous system: Alert and oriented. No focal neurological deficits. Extremities: No pedal edema. Skin: No rashes, lesions or ulcers Psychiatry: Judgement and insight appear normal. Mood & affect appropriate.   Data Reviewed:    Labs: Basic Metabolic Panel: Recent Labs  Lab 07/22/19 1039 07/22/19 1254 07/23/19 0449 07/24/19 0450 07/25/19 0536 07/26/19 0031  NA 137  --  137 139 139 142  K 3.6  --  3.6  3.7 4.0 3.9  CL 97*  --  96* 97* 97* 100  CO2 27  --  30 28 29 30   GLUCOSE 94  --  156* 165* 171* 168*  BUN 17  --  16 22* 27* 30*  CREATININE 0.90 0.93 0.94 0.87 0.75 0.80  CALCIUM 8.3*  --  8.5* 8.8* 8.7* 8.6*  MG  --   --  2.2 2.5* 2.5* 2.7*  PHOS  --   --  4.0 4.1 3.5 3.5   GFR Estimated Creatinine Clearance: 157.5 mL/min (by C-G formula based on SCr of 0.8 mg/dL). Liver Function Tests: Recent Labs  Lab 07/22/19 1039 07/23/19 0449 07/24/19 0450 07/25/19 0536 07/26/19 0031  AST 58* 56* 50* 40 34  ALT 36 40 38 33 34  ALKPHOS 50 49 47 49 50  BILITOT 0.3 0.3 0.8 0.6 0.8  PROT 7.1 6.9 7.3 6.8 7.0   ALBUMIN 3.3* 3.1* 3.3* 3.1* 3.4*   No results for input(s): LIPASE, AMYLASE in the last 168 hours. No results for input(s): AMMONIA in the last 168 hours. Coagulation profile No results for input(s): INR, PROTIME in the last 168 hours. COVID-19 Labs  Recent Labs    07/24/19 0450 07/25/19 0536 07/26/19 0031  DDIMER 0.43 0.33 0.50  FERRITIN 750*  --   --   CRP 2.7* 0.9 0.7    Lab Results  Component Value Date   SARSCOV2NAA Detected (A) 07/19/2019   SARSCOV2NAA NOT DETECTED 04/29/2019   [place] NEGATIVE 04/17/2019    CBC: Recent Labs  Lab 07/22/19 1039 07/22/19 1254 07/23/19 0449 07/24/19 0450 07/25/19 0536 07/26/19 0031  WBC 2.8* 3.2* 2.2* 6.3 4.9 4.8  NEUTROABS 1.7  --  1.4* 4.9 3.7 3.5  HGB 14.4 14.4 14.6 14.4 14.3 13.7  HCT 45.2 44.0 44.9 45.5 45.2 43.4  MCV 84.6 84.1 84.6 84.4 85.3 85.3  PLT 92* 94* 96* 130* 129* 136*   Cardiac Enzymes: No results for input(s): CKTOTAL, CKMB, CKMBINDEX, TROPONINI in the last 168 hours. BNP (last 3 results) No results for input(s): PROBNP in the last 8760 hours. CBG: Recent Labs  Lab 07/25/19 1206 07/25/19 1654 07/25/19 2107  GLUCAP 168* 157* 171*   D-Dimer: Recent Labs    07/25/19 0536 07/26/19 0031  DDIMER 0.33 0.50   Hgb A1c: Recent Labs    07/24/19 0450  HGBA1C 6.7*   Lipid Profile: No results for input(s): CHOL, HDL, LDLCALC, TRIG, CHOLHDL, LDLDIRECT in the last 72 hours. Thyroid function studies: No results for input(s): TSH, T4TOTAL, T3FREE, THYROIDAB in the last 72 hours.  Invalid input(s): FREET3 Anemia work up: Recent Labs    07/24/19 0450  FERRITIN 750*   Sepsis Labs: Recent Labs  Lab 07/22/19 1039 07/22/19 1315 07/23/19 0449 07/24/19 0450 07/25/19 0536 07/26/19 0031  PROCALCITON <0.10  --   --   --   --   --   WBC 2.8*  --  2.2* 6.3 4.9 4.8  LATICACIDVEN 1.2 1.1  --   --   --   --    Microbiology Recent Results (from the past 240 hour(s))  Novel Coronavirus, NAA ([facility])      Status: Abnormal   Collection Time: 07/19/19 12:00 AM   Specimen: Nasopharyngeal(NP) swabs in vial transport medium   NASOPHARYNGE  TESTING  Result Value Ref Range Status   SARS-CoV-2, NAA Detected (A) Not Detected Final    Comment: This nucleic acid amplification test was developed and its performance characteristics determined by [employer]. Nucleic acid amplification  tests include PCR and TMA. This test has not been FDA cleared or approved. This test has been authorized by FDA under an Emergency Use Authorization (EUA). This test is only authorized for the duration of time the declaration that circumstances exist justifying the authorization of the emergency use of in vitro diagnostic tests for detection of SARS-CoV-2 virus and/or diagnosis of COVID-19 infection under section 564(b)(1) of the Act, 21 U.S.C. 174JFT-9(B) (1), unless the authorization is terminated or revoked sooner. When diagnostic testing is negative, the possibility of a false negative result should be considered in the context of a patient's recent exposures and the presence of clinical signs and symptoms consistent with COVID-19. An individual without symptoms of COVID-19 and who is not shedding SARS-CoV-2 virus would  expect to have a negative (not detected) result in this assay.   Blood Culture (routine x 2)     Status: None (Preliminary result)   Collection Time: 07/22/19 11:57 AM   Specimen: BLOOD  Result Value Ref Range Status   Specimen Description BLOOD SITE NOT SPECIFIED  Final   Special Requests   Final    BOTTLES DRAWN AEROBIC AND ANAEROBIC Blood Culture adequate volume   Culture   Final    NO GROWTH 3 DAYS Performed at [facility], [address], [place], [place] [address]    Report Status PENDING  Incomplete  Culture, sputum-assessment     Status: None   Collection Time: 07/23/19 12:17 AM   Specimen: Sputum  Result Value Ref Range Status   Specimen Description SPUTUM   Final   Special Requests NONE  Final   Sputum evaluation   Final    THIS SPECIMEN IS ACCEPTABLE FOR SPUTUM CULTURE Performed at [facility], [address], [place], [place] [address]    Report Status 07/23/2019 FINAL  Final  Culture, respiratory     Status: None   Collection Time: 07/23/19 12:17 AM   Specimen: SPU  Result Value Ref Range Status   Specimen Description SPUTUM  Final   Special Requests NONE Reflexed from [id number]  Final   Gram Stain   Final    FEW WBC PRESENT, PREDOMINANTLY PMN ABUNDANT GRAM NEGATIVE COCCOBACILLI RARE GRAM POSITIVE COCCI    Culture   Final    Consistent with normal respiratory flora. Performed at [facility], [place] [address], [place], [place] [address]    Report Status 07/25/2019 FINAL  Final     Medications:   . albuterol  2 puff Inhalation Q4H  . vitamin C  500 mg Oral Daily  . dexamethasone (DECADRON) injection  6 mg Intravenous Q12H  . enoxaparin (LOVENOX) injection  70 mg Subcutaneous Daily  . insulin aspart  0-15 Units Subcutaneous TID WC  . insulin aspart  0-5 Units Subcutaneous QHS  . pantoprazole  40 mg Oral Daily  . zinc sulfate  220 mg Oral Daily   Continuous Infusions: . remdesivir 100 mg in NS 100 mL Stopped (07/25/19 1015)      LOS: 4 days   Charlynne Cousins  [facility]  07/26/2019, 7:45 AM

## 2019-07-26 NOTE — Progress Notes (Signed)
Called and spoke with pt's wife, [name]. She did not have any questions so I updated her to the events of the day, including pt's ability to remain on high flow and avoid HHFNC for today. She was appreciative of the update.

## 2019-07-27 LAB — D-DIMER, QUANTITATIVE: D-Dimer, Quant: 0.48 ug/mL-FEU (ref 0.00–0.50)

## 2019-07-27 LAB — CBC WITH DIFFERENTIAL/PLATELET
Abs Immature Granulocytes: 0.07 10*3/uL (ref 0.00–0.07)
Basophils Absolute: 0 10*3/uL (ref 0.0–0.1)
Basophils Relative: 0 %
Eosinophils Absolute: 0 10*3/uL (ref 0.0–0.5)
Eosinophils Relative: 0 %
HCT: 45 % (ref 39.0–52.0)
Hemoglobin: 14.2 g/dL (ref 13.0–17.0)
Immature Granulocytes: 1 %
Lymphocytes Relative: 14 %
Lymphs Abs: 0.8 10*3/uL (ref 0.7–4.0)
MCH: 26.8 pg (ref 26.0–34.0)
MCHC: 31.6 g/dL (ref 30.0–36.0)
MCV: 85.1 fL (ref 80.0–100.0)
Monocytes Absolute: 0.4 10*3/uL (ref 0.1–1.0)
Monocytes Relative: 7 %
Neutro Abs: 4.7 10*3/uL (ref 1.7–7.7)
Neutrophils Relative %: 78 %
Platelets: 141 10*3/uL — ABNORMAL LOW (ref 150–400)
RBC: 5.29 MIL/uL (ref 4.22–5.81)
RDW: 13.1 % (ref 11.5–15.5)
WBC: 6 10*3/uL (ref 4.0–10.5)
nRBC: 0 % (ref 0.0–0.2)

## 2019-07-27 LAB — COMPREHENSIVE METABOLIC PANEL
ALT: 40 U/L (ref 0–44)
AST: 30 U/L (ref 15–41)
Albumin: 3.6 g/dL (ref 3.5–5.0)
Alkaline Phosphatase: 50 U/L (ref 38–126)
Anion gap: 12 (ref 5–15)
BUN: 37 mg/dL — ABNORMAL HIGH (ref 6–20)
CO2: 31 mmol/L (ref 22–32)
Calcium: 8.6 mg/dL — ABNORMAL LOW (ref 8.9–10.3)
Chloride: 99 mmol/L (ref 98–111)
Creatinine, Ser: 0.99 mg/dL (ref 0.61–1.24)
GFR calc Af Amer: >60 mL/min (ref >60)
GFR calc non Af Amer: >60 mL/min (ref >60)
Glucose, Bld: 164 mg/dL — ABNORMAL HIGH (ref 70–99)
Potassium: 4.3 mmol/L (ref 3.5–5.1)
Sodium: 142 mmol/L (ref 135–145)
Total Bilirubin: 1.1 mg/dL (ref 0.3–1.2)
Total Protein: 7 g/dL (ref 6.5–8.1)

## 2019-07-27 LAB — MAGNESIUM
Magnesium: 2.6 mg/dL — ABNORMAL HIGH (ref 1.7–2.4)
Magnesium: 2.4 mg/dL (ref 1.7–2.4)

## 2019-07-27 LAB — GLUCOSE, CAPILLARY
Glucose-Capillary: 129 mg/dL — ABNORMAL HIGH (ref 70–99)
Glucose-Capillary: 131 mg/dL — ABNORMAL HIGH (ref 70–99)
Glucose-Capillary: 204 mg/dL — ABNORMAL HIGH (ref 70–99)
Glucose-Capillary: 113 mg/dL — ABNORMAL HIGH (ref 70–99)
Glucose-Capillary: 128 mg/dL — ABNORMAL HIGH (ref 70–99)

## 2019-07-27 LAB — C-REACTIVE PROTEIN: CRP: 2.2 mg/dL — ABNORMAL HIGH (ref <1.0)

## 2019-07-27 LAB — CULTURE, BLOOD (ROUTINE X 2)
Culture: NO GROWTH
Special Requests: ADEQUATE

## 2019-07-27 LAB — PHOSPHORUS: Phosphorus: 3.9 mg/dL (ref 2.5–4.6)

## 2019-07-27 MED ORDER — MAGNESIUM SULFATE 2 GM/50ML IV SOLN
2.0000 g | Freq: Once | INTRAVENOUS | Status: DC
  Filled 2019-07-27: qty 50

## 2019-07-27 NOTE — Progress Notes (Signed)
[facility] PROGRESS NOTE    Progress Note  [name]  KZL:[id number] DOB: March 24, 1976 DOA: 07/22/2019 PCP: Tamsen Roers, MD     Brief Narrative:   [name] is an 43 y.o. male past medical history significant of hypertension, GERD presents to the ED with worsening cough and shortness of breath along with fever for 1 week prior to admission.  He relate he tested positive for his SARS-CoV-2 3 days prior to admission.  Assessment/Plan:   Acute respiratory failure with hypoxia due to COVID-19 viral pneumonia: Patient is requiring 15 L of high flow nasal cannula and nonrebreather to keep saturations greater than 86%. Continue IV remdesivir for total of 10 days, continue IV steroids vitamin C and zinc.  We will continue IV remdesivir to 10 days. He is status post convalescent plasma and Actemra on 07/25/2019. To keep the patient prone for 16 hours a day if not prone out of bed to chair. He relates his breathing is unchanged compared to yesterday, his appetite is improving.  Essential hypertension: Blood pressure fairly controlled.  Thrombocytopenia/Leukopenia Likely due to COVID-19 rule out improving this morning.  DVT prophylaxis: lovenox Family Communication:none Disposition Plan/Barrier to D/C: Unable to determine. Code Status:     Code Status Orders  (From admission, onward)         Start     Ordered   07/22/19 1301  Full code  Continuous     07/22/19 1302        Code Status History    Date Active Date Inactive Code Status Order ID Comments User Context   09/29/2017 1636 10/02/2017 1959 Full Code [id number]  Leandrew Koyanagi, MD Inpatient   04/18/2017 0042 04/21/2017 1909 Full Code [id number]  Edwin Dada, MD Inpatient   Advance Care Planning Activity        IV Access:    Peripheral IV   Procedures and diagnostic studies:   No results found.   Medical Consultants:    None.  Anti-Infectives:   IV remdesivir.  Subjective:     [name] relates his breathing is unchanged compared to yesterday.  Objective:    Vitals:   07/26/19 2156 07/26/19 2300 07/27/19 0400 07/27/19 0830  BP:  (!) 144/91 135/82 130/74  Pulse: 75 75 76 88  Resp: (!) 23 20 18  (!) 24  Temp:  (!) 97.5 F (36.4 C) 98.3 F (36.8 C) 97.6 F (36.4 C)  TempSrc:  Axillary Oral Oral  SpO2: 94% 94% 92% (!) 86%  Weight:       SpO2: (!) 86 % O2 Flow Rate (L/min): 15 L/min FiO2 (%): 98 %   Intake/Output Summary (Last 24 hours) at 07/27/2019 0840 Last data filed at 07/27/2019 0700 Gross per 24 hour  Intake 519.93 ml  Output 2595 ml  Net -2075.07 ml   Filed Weights   07/22/19 1300  Weight: 136.1 kg    Exam: General exam: In no acute distress, it appears his breathing is slower. Respiratory system: Good air movement and diffuse crackles bilaterally Cardiovascular system: S1 & S2 heard, RRR. No JVD. Gastrointestinal system: Abdomen is nondistended, soft and nontender.  Central nervous system: Alert and oriented. No focal neurological deficits. Extremities: No pedal edema. Skin: No rashes, lesions or ulcers Psychiatry: Judgement and insight appear normal. Mood & affect appropriate.   Data Reviewed:    Labs: Basic Metabolic Panel: Recent Labs  Lab 07/23/19 0449 07/24/19 0450 07/25/19 0536 07/26/19 0031 07/27/19 0104  NA 137 139  139 142 142  K 3.6 3.7 4.0 3.9 4.3  CL 96* 97* 97* 100 99  CO2 30 28 29 30 31   GLUCOSE 156* 165* 171* 168* 164*  BUN 16 22* 27* 30* 37*  CREATININE 0.94 0.87 0.75 0.80 0.99  CALCIUM 8.5* 8.8* 8.7* 8.6* 8.6*  MG 2.2 2.5* 2.5* 2.7* 2.6*  PHOS 4.0 4.1 3.5 3.5 3.9   GFR Estimated Creatinine Clearance: 127.3 mL/min (by C-G formula based on SCr of 0.99 mg/dL). Liver Function Tests: Recent Labs  Lab 07/23/19 0449 07/24/19 0450 07/25/19 0536 07/26/19 0031 07/27/19 0104  AST 56* 50* 40 34 30  ALT 40 38 33 34 40  ALKPHOS 49 47 49 50 50  BILITOT 0.3 0.8 0.6 0.8 1.1  PROT 6.9 7.3 6.8 7.0  7.0  ALBUMIN 3.1* 3.3* 3.1* 3.4* 3.6   No results for input(s): LIPASE, AMYLASE in the last 168 hours. No results for input(s): AMMONIA in the last 168 hours. Coagulation profile No results for input(s): INR, PROTIME in the last 168 hours. COVID-19 Labs  Recent Labs    07/25/19 0536 07/26/19 0031 07/27/19 0104  DDIMER 0.33 0.50 0.48  CRP 0.9 0.7 2.2*    Lab Results  Component Value Date   SARSCOV2NAA Detected (A) 07/19/2019   SARSCOV2NAA NOT DETECTED 04/29/2019   [place] NEGATIVE 04/17/2019    CBC: Recent Labs  Lab 07/23/19 0449 07/24/19 0450 07/25/19 0536 07/26/19 0031 07/27/19 0104  WBC 2.2* 6.3 4.9 4.8 6.0  NEUTROABS 1.4* 4.9 3.7 3.5 4.7  HGB 14.6 14.4 14.3 13.7 14.2  HCT 44.9 45.5 45.2 43.4 45.0  MCV 84.6 84.4 85.3 85.3 85.1  PLT 96* 130* 129* 136* 141*   Cardiac Enzymes: No results for input(s): CKTOTAL, CKMB, CKMBINDEX, TROPONINI in the last 168 hours. BNP (last 3 results) No results for input(s): PROBNP in the last 8760 hours. CBG: Recent Labs  Lab 07/25/19 1654 07/25/19 2107 07/26/19 1115 07/26/19 1559 07/26/19 2016  GLUCAP 157* 171* 130* 138* 139*   D-Dimer: Recent Labs    07/26/19 0031 07/27/19 0104  DDIMER 0.50 0.48   Hgb A1c: No results for input(s): HGBA1C in the last 72 hours. Lipid Profile: No results for input(s): CHOL, HDL, LDLCALC, TRIG, CHOLHDL, LDLDIRECT in the last 72 hours. Thyroid function studies: No results for input(s): TSH, T4TOTAL, T3FREE, THYROIDAB in the last 72 hours.  Invalid input(s): FREET3 Anemia work up: No results for input(s): VITAMINB12, FOLATE, FERRITIN, TIBC, IRON, RETICCTPCT in the last 72 hours. Sepsis Labs: Recent Labs  Lab 07/22/19 1039 07/22/19 1315 07/24/19 0450 07/25/19 0536 07/26/19 0031 07/27/19 0104  PROCALCITON <0.10  --   --   --   --   --   WBC 2.8*  --  6.3 4.9 4.8 6.0  LATICACIDVEN 1.2 1.1  --   --   --   --    Microbiology Recent Results (from the past 240 hour(s))  Novel  Coronavirus, NAA ([facility])     Status: Abnormal   Collection Time: 07/19/19 12:00 AM   Specimen: Nasopharyngeal(NP) swabs in vial transport medium   NASOPHARYNGE  TESTING  Result Value Ref Range Status   SARS-CoV-2, NAA Detected (A) Not Detected Final    Comment: This nucleic acid amplification test was developed and its performance characteristics determined by [employer]. Nucleic acid amplification tests include PCR and TMA. This test has not been FDA cleared or approved. This test has been authorized by FDA under an Emergency Use Authorization (EUA). This test is only  authorized for the duration of time the declaration that circumstances exist justifying the authorization of the emergency use of in vitro diagnostic tests for detection of SARS-CoV-2 virus and/or diagnosis of COVID-19 infection under section 564(b)(1) of the Act, 21 U.S.C. 124PYK-9(X) (1), unless the authorization is terminated or revoked sooner. When diagnostic testing is negative, the possibility of a false negative result should be considered in the context of a patient's recent exposures and the presence of clinical signs and symptoms consistent with COVID-19. An individual without symptoms of COVID-19 and who is not shedding SARS-CoV-2 virus would  expect to have a negative (not detected) result in this assay.   Blood Culture (routine x 2)     Status: None (Preliminary result)   Collection Time: 07/22/19 11:57 AM   Specimen: BLOOD  Result Value Ref Range Status   Specimen Description BLOOD SITE NOT SPECIFIED  Final   Special Requests   Final    BOTTLES DRAWN AEROBIC AND ANAEROBIC Blood Culture adequate volume   Culture   Final    NO GROWTH 4 DAYS Performed at [facility], [address] [place], [place] [address]    Report Status PENDING  Incomplete  Culture, sputum-assessment     Status: None   Collection Time: 07/23/19 12:17 AM   Specimen: Sputum  Result Value Ref Range Status    Specimen Description SPUTUM  Final   Special Requests NONE  Final   Sputum evaluation   Final    THIS SPECIMEN IS ACCEPTABLE FOR SPUTUM CULTURE Performed at [facility], [address], [place], [place] [address]    Report Status 07/23/2019 FINAL  Final  Culture, respiratory     Status: None   Collection Time: 07/23/19 12:17 AM   Specimen: SPU  Result Value Ref Range Status   Specimen Description SPUTUM  Final   Special Requests NONE Reflexed from [id number]  Final   Gram Stain   Final    FEW WBC PRESENT, PREDOMINANTLY PMN ABUNDANT GRAM NEGATIVE COCCOBACILLI RARE GRAM POSITIVE COCCI    Culture   Final    Consistent with normal respiratory flora. Performed at [facility], [place] [address], [place], [place] [address]    Report Status 07/25/2019 FINAL  Final     Medications:   . albuterol  2 puff Inhalation Q4H  . vitamin C  500 mg Oral Daily  . dexamethasone (DECADRON) injection  6 mg Intravenous Q12H  . enoxaparin (LOVENOX) injection  70 mg Subcutaneous Daily  . insulin aspart  0-15 Units Subcutaneous TID WC  . insulin aspart  0-5 Units Subcutaneous QHS  . pantoprazole  40 mg Oral Daily  . zinc sulfate  220 mg Oral Daily   Continuous Infusions: . remdesivir 100 mg in NS 100 mL        LOS: 5 days   Charlynne Cousins  [facility]  07/27/2019, 8:40 AM

## 2019-07-27 NOTE — Progress Notes (Signed)
Patient remains AOx4.  He needs help to move from bed to chair and to [name] for which he desaturates from low 90's to 70's.  Upon stopping and utilizing conscious breathing patient returns back to low 90's.  Patient had two episodes of emesis today before his meals.  He had little output of vomit.  His UOP is WDL. His vitals are WDL.  He complains of no pain.  Contacted physician for his QTC increasing to 530.  Physician aware.  Patient remains alert and calm.

## 2019-07-28 ENCOUNTER — Inpatient Hospital Stay (HOSPITAL_COMMUNITY): Payer: Managed Care, Other (non HMO)

## 2019-07-28 ENCOUNTER — Other Ambulatory Visit: Payer: Self-pay

## 2019-07-28 LAB — GLUCOSE, CAPILLARY
Glucose-Capillary: 110 mg/dL — ABNORMAL HIGH (ref 70–99)
Glucose-Capillary: 133 mg/dL — ABNORMAL HIGH (ref 70–99)
Glucose-Capillary: 144 mg/dL — ABNORMAL HIGH (ref 70–99)
Glucose-Capillary: 203 mg/dL — ABNORMAL HIGH (ref 70–99)

## 2019-07-28 MED ORDER — ALBUTEROL SULFATE HFA 108 (90 BASE) MCG/ACT IN AERS
2.0000 | INHALATION_SPRAY | Freq: Four times a day (QID) | RESPIRATORY_TRACT | Status: DC
  Administered 2019-07-28 – 2019-08-03 (×24): 2 via RESPIRATORY_TRACT
  Filled 2019-07-28: qty 6.7

## 2019-07-28 NOTE — Progress Notes (Incomplete)
S/w spouse for update today. Stated she was told yesterday he'd improved.    ? ?MD notified:  Pt's wife [name] has requested MD call her [phone]).  Pt may need to transition to Heated HFNC as barely maintains sats in low 80's with any movement.  ?

## 2019-07-28 NOTE — Progress Notes (Addendum)
[facility] PROGRESS NOTE    Progress Note  [name]  HQI:[id number] DOB: 1976-02-14 DOA: 07/22/2019 PCP: Tamsen Roers, MD     Brief Narrative:   [name] is an 43 y.o. male past medical history significant of hypertension, GERD presents to the ED with worsening cough and shortness of breath along with fever for 1 week prior to admission.  He relate he tested positive for his SARS-CoV-2 3 days prior to admission.  Since then he has a productive cough shortness of breath wheezing and chest tightness with decreased appetite.  He was admitted to the hospital and was placed on 10 L of oxygen for nonrebreather, he restarted on IV remdesivir steroids, was given convalescent plasma and Actemra on 07/25/2019.  Despite this he continued to be short of breath placement today on a heated high flow.  Assessment/Plan:   Acute respiratory failure with hypoxia due to COVID-19 viral pneumonia, now with ARDS: Still requiring 15 L high flow nasal cannula and nonrebreather to keep saturations greater than 94%. Continue IV remdesivir for a total of 10 days, continue vitamin C and zinc, continue IV steroids. He did receive convalescent plasma and Actemra on 07/24/2019. Try to keep the patient peripherally 16 hours a day, if not prone out of bed to chair.  Essential hypertension: Blood pressure fairly controlled.  Thrombocytopenia/Leukopenia Likely due to COVID-19 rule out improving this morning.  Newly diagnosed diabetes mellitus type 2: With an A1c of 6.7, continue long-acting insulin plus sliding scale.  His blood glucose been fairly controlled.  DVT prophylaxis: lovenox Family Communication:none Disposition Plan/Barrier to D/C: Unable to determine. Code Status:     Code Status Orders  (From admission, onward)         Start     Ordered   07/22/19 1301  Full code  Continuous     07/22/19 1302        Code Status History    Date Active Date Inactive Code Status Order ID  Comments User Context   09/29/2017 1636 10/02/2017 1959 Full Code [id number]  Leandrew Koyanagi, MD Inpatient   04/18/2017 0042 04/21/2017 1909 Full Code [id number]  Edwin Dada, MD Inpatient   Advance Care Planning Activity        IV Access:    Peripheral IV   Procedures and diagnostic studies:   No results found.   Medical Consultants:    None.  Anti-Infectives:   IV remdesivir.  Subjective:    [name] relates his breathing is not improving can tell no difference compared to yesterday.  Objective:    Vitals:   07/27/19 1600 07/27/19 2032 07/27/19 2035 07/28/19 0416  BP: 123/77 (!) 134/59  139/83  Pulse: 86 76 78 87  Resp: (!) 21 16 18    Temp: 98.3 F (36.8 C) (!) 97.3 F (36.3 C)  97.6 F (36.4 C)  TempSrc: Oral Oral  Oral  SpO2: 92% 94% 94% 94%  Weight:       SpO2: 94 % O2 Flow Rate (L/min): 15 L/min FiO2 (%): 100 %   Intake/Output Summary (Last 24 hours) at 07/28/2019 0807 Last data filed at 07/28/2019 0600 Gross per 24 hour  Intake 400 ml  Output 1100 ml  Net -700 ml   Filed Weights   07/22/19 1300  Weight: 136.1 kg    Exam: General exam: In no acute distress. Respiratory system: Good air movement and diffuse crackles bilaterally. Cardiovascular system: S1 & S2 heard, RRR. No JVD. Gastrointestinal  system: Abdomen is nondistended, soft and nontender.  Central nervous system: Alert and oriented. No focal neurological deficits. Extremities: No pedal edema. Skin: No rashes, lesions or ulcers Psychiatry: Judgement and insight appear [name]. Mood & affect appropriate.   Data Reviewed:    Labs: Basic Metabolic Panel: Recent Labs  Lab 07/23/19 0449 07/24/19 0450 07/25/19 0536 07/26/19 0031 07/27/19 0104 07/27/19 1708  NA 137 139 139 142 142  --   K 3.6 3.7 4.0 3.9 4.3  --   CL 96* 97* 97* 100 99  --   CO2 30 28 29 30 31   --   GLUCOSE 156* 165* 171* 168* 164*  --   BUN 16 22* 27* 30* 37*  --   CREATININE 0.94 0.87  0.75 0.80 0.99  --   CALCIUM 8.5* 8.8* 8.7* 8.6* 8.6*  --   MG 2.2 2.5* 2.5* 2.7* 2.6* 2.4  PHOS 4.0 4.1 3.5 3.5 3.9  --    GFR Estimated Creatinine Clearance: 127.3 mL/min (by C-G formula based on SCr of 0.99 mg/dL). Liver Function Tests: Recent Labs  Lab 07/23/19 0449 07/24/19 0450 07/25/19 0536 07/26/19 0031 07/27/19 0104  AST 56* 50* 40 34 30  ALT 40 38 33 34 40  ALKPHOS 49 47 49 50 50  BILITOT 0.3 0.8 0.6 0.8 1.1  PROT 6.9 7.3 6.8 7.0 7.0  ALBUMIN 3.1* 3.3* 3.1* 3.4* 3.6   No results for input(s): LIPASE, AMYLASE in the last 168 hours. No results for input(s): AMMONIA in the last 168 hours. Coagulation profile No results for input(s): INR, PROTIME in the last 168 hours. COVID-19 Labs  Recent Labs    07/26/19 0031 07/27/19 0104  DDIMER 0.50 0.48  CRP 0.7 2.2*    Lab Results  Component Value Date   SARSCOV2NAA Detected (A) 07/19/2019   SARSCOV2NAA NOT DETECTED 04/29/2019   [place] NEGATIVE 04/17/2019    CBC: Recent Labs  Lab 07/23/19 0449 07/24/19 0450 07/25/19 0536 07/26/19 0031 07/27/19 0104  WBC 2.2* 6.3 4.9 4.8 6.0  NEUTROABS 1.4* 4.9 3.7 3.5 4.7  HGB 14.6 14.4 14.3 13.7 14.2  HCT 44.9 45.5 45.2 43.4 45.0  MCV 84.6 84.4 85.3 85.3 85.1  PLT 96* 130* 129* 136* 141*   Cardiac Enzymes: No results for input(s): CKTOTAL, CKMB, CKMBINDEX, TROPONINI in the last 168 hours. BNP (last 3 results) No results for input(s): PROBNP in the last 8760 hours. CBG: Recent Labs  Lab 07/27/19 0827 07/27/19 1033 07/27/19 1132 07/27/19 1738 07/27/19 2041  GLUCAP 129* 131* 204* 113* 128*   D-Dimer: Recent Labs    07/26/19 0031 07/27/19 0104  DDIMER 0.50 0.48   Hgb A1c: No results for input(s): HGBA1C in the last 72 hours. Lipid Profile: No results for input(s): CHOL, HDL, LDLCALC, TRIG, CHOLHDL, LDLDIRECT in the last 72 hours. Thyroid function studies: No results for input(s): TSH, T4TOTAL, T3FREE, THYROIDAB in the last 72 hours.  Invalid  input(s): FREET3 Anemia work up: No results for input(s): VITAMINB12, FOLATE, FERRITIN, TIBC, IRON, RETICCTPCT in the last 72 hours. Sepsis Labs: Recent Labs  Lab 07/22/19 1039 07/22/19 1315 07/24/19 0450 07/25/19 0536 07/26/19 0031 07/27/19 0104  PROCALCITON <0.10  --   --   --   --   --   WBC 2.8*  --  6.3 4.9 4.8 6.0  LATICACIDVEN 1.2 1.1  --   --   --   --    Microbiology Recent Results (from the past 240 hour(s))  Novel Coronavirus, NAA ([facility])  Status: Abnormal   Collection Time: 07/19/19 12:00 AM   Specimen: Nasopharyngeal(NP) swabs in vial transport medium   NASOPHARYNGE  TESTING  Result Value Ref Range Status   SARS-CoV-2, NAA Detected (A) Not Detected Final    Comment: This nucleic acid amplification test was developed and its performance characteristics determined by [employer]. Nucleic acid amplification tests include PCR and TMA. This test has not been FDA cleared or approved. This test has been authorized by FDA under an Emergency Use Authorization (EUA). This test is only authorized for the duration of time the declaration that circumstances exist justifying the authorization of the emergency use of in vitro diagnostic tests for detection of SARS-CoV-2 virus and/or diagnosis of COVID-19 infection under section 564(b)(1) of the Act, 21 U.S.C. 389HTD-4(K) (1), unless the authorization is terminated or revoked sooner. When diagnostic testing is negative, the possibility of a false negative result should be considered in the context of a patient's recent exposures and the presence of clinical signs and symptoms consistent with COVID-19. An individual without symptoms of COVID-19 and who is not shedding SARS-CoV-2 virus would  expect to have a negative (not detected) result in this assay.   Blood Culture (routine x 2)     Status: None   Collection Time: 07/22/19 11:57 AM   Specimen: BLOOD  Result Value Ref Range Status   Specimen Description  BLOOD SITE NOT SPECIFIED  Final   Special Requests   Final    BOTTLES DRAWN AEROBIC AND ANAEROBIC Blood Culture adequate volume   Culture   Final    NO GROWTH 5 DAYS Performed at [facility], [address] [place], [place] [address]    Report Status 07/27/2019 FINAL  Final  Culture, sputum-assessment     Status: None   Collection Time: 07/23/19 12:17 AM   Specimen: Sputum  Result Value Ref Range Status   Specimen Description SPUTUM  Final   Special Requests NONE  Final   Sputum evaluation   Final    THIS SPECIMEN IS ACCEPTABLE FOR SPUTUM CULTURE Performed at [facility], [place] [address] [place], [place] [address]    Report Status 07/23/2019 FINAL  Final  Culture, respiratory     Status: None   Collection Time: 07/23/19 12:17 AM   Specimen: SPU  Result Value Ref Range Status   Specimen Description SPUTUM  Final   Special Requests NONE Reflexed from [id number]  Final   Gram Stain   Final    FEW WBC PRESENT, PREDOMINANTLY PMN ABUNDANT GRAM NEGATIVE COCCOBACILLI RARE GRAM POSITIVE COCCI    Culture   Final    Consistent with [name] respiratory flora. Performed at [facility], [place] [address] [place],  [address]    Report Status 07/25/2019 FINAL  Final     Medications:   . albuterol  2 puff Inhalation Q6H  . vitamin C  500 mg Oral Daily  . dexamethasone (DECADRON) injection  6 mg Intravenous Q12H  . enoxaparin (LOVENOX) injection  70 mg Subcutaneous Daily  . insulin aspart  0-15 Units Subcutaneous TID WC  . insulin aspart  0-5 Units Subcutaneous QHS  . pantoprazole  40 mg Oral Daily  . zinc sulfate  220 mg Oral Daily   Continuous Infusions: . remdesivir 100 mg in NS 100 mL Stopped (07/27/19 1030)      LOS: 6 days   Charlynne Cousins  [facility]  07/28/2019, 8:07 AM

## 2019-07-28 NOTE — Progress Notes (Signed)
Rt called to place pt on [facility]HFNC.  Upon arrival, pt on NRB and 15LPM salter with sat of 89% and pt was not visibly in distress.  Pt never dropped below 86 while RT present except during change to HHFNC.which caused drop to 80% while on only the salter during exchange.  Pt has no increased WOB and flow not indictated to increase.  O2 requirements seem to be increasing.  Pt placed on NRB at 15L and [place] at 20LPM (pt complained at 25LPM) and 100% Fio2. Rt spoke to pt about proning and expressed need for pt to be compliant.  RT will monitor.

## 2019-07-28 NOTE — Plan of Care (Signed)
  Problem: Education: Goal: Knowledge of risk factors and measures for prevention of condition will improve Outcome: Progressing   Problem: Coping: Goal: Psychosocial and spiritual needs will be supported Outcome: Progressing   Problem: Respiratory: Goal: Will maintain a patent airway Outcome: Progressing Goal: Complications related to the disease process, condition or treatment will be avoided or minimized Outcome: Progressing   

## 2019-07-29 LAB — PROCALCITONIN: Procalcitonin: <0.1 ng/mL

## 2019-07-29 LAB — GLUCOSE, CAPILLARY
Glucose-Capillary: 118 mg/dL — ABNORMAL HIGH (ref 70–99)
Glucose-Capillary: 128 mg/dL — ABNORMAL HIGH (ref 70–99)
Glucose-Capillary: 125 mg/dL — ABNORMAL HIGH (ref 70–99)
Glucose-Capillary: 135 mg/dL — ABNORMAL HIGH (ref 70–99)

## 2019-07-29 LAB — BASIC METABOLIC PANEL
Anion gap: 14 (ref 5–15)
BUN: 33 mg/dL — ABNORMAL HIGH (ref 6–20)
CO2: 23 mmol/L (ref 22–32)
Calcium: 8.8 mg/dL — ABNORMAL LOW (ref 8.9–10.3)
Chloride: 101 mmol/L (ref 98–111)
Creatinine, Ser: 0.74 mg/dL (ref 0.61–1.24)
GFR calc Af Amer: >60 mL/min (ref >60)
GFR calc non Af Amer: >60 mL/min (ref >60)
Glucose, Bld: 115 mg/dL — ABNORMAL HIGH (ref 70–99)
Potassium: 4.1 mmol/L (ref 3.5–5.1)
Sodium: 138 mmol/L (ref 135–145)

## 2019-07-29 LAB — C-REACTIVE PROTEIN: CRP: 2.3 mg/dL — ABNORMAL HIGH (ref <1.0)

## 2019-07-29 MED ORDER — ONDANSETRON HCL 4 MG/2ML IJ SOLN
4.0000 mg | Freq: Once | INTRAMUSCULAR | Status: AC
  Administered 2019-07-29: 14:00:00 4 mg via INTRAVENOUS

## 2019-07-29 MED ORDER — DEXAMETHASONE SODIUM PHOSPHATE 10 MG/ML IJ SOLN
6.0000 mg | INTRAMUSCULAR | Status: DC
  Administered 2019-07-30 – 2019-08-05 (×7): 6 mg via INTRAVENOUS
  Filled 2019-07-29: qty 0.6
  Filled 2019-07-29 (×2): qty 1
  Filled 2019-07-29: qty 0.6
  Filled 2019-07-29 (×3): qty 1
  Filled 2019-07-29: qty 0.6

## 2019-07-29 MED ORDER — METOPROLOL TARTRATE 25 MG PO TABS
25.0000 mg | ORAL_TABLET | Freq: Two times a day (BID) | ORAL | Status: DC
  Administered 2019-07-29 – 2019-07-30 (×3): 25 mg via ORAL
  Filled 2019-07-29 (×3): qty 1

## 2019-07-29 MED ORDER — LORAZEPAM 0.5 MG PO TABS
1.0000 mg | ORAL_TABLET | Freq: Three times a day (TID) | ORAL | Status: DC | PRN
  Administered 2019-07-29 – 2019-08-02 (×12): 1 mg via ORAL
  Filled 2019-07-29 (×2): qty 1
  Filled 2019-07-29 (×2): qty 2
  Filled 2019-07-29 (×6): qty 1
  Filled 2019-07-29: qty 2
  Filled 2019-07-29: qty 1

## 2019-07-29 MED ORDER — INSULIN DETEMIR 100 UNIT/ML ~~LOC~~ SOLN
20.0000 [IU] | Freq: Two times a day (BID) | SUBCUTANEOUS | Status: DC
  Administered 2019-07-29 – 2019-08-03 (×10): 20 [IU] via SUBCUTANEOUS
  Filled 2019-07-29 (×12): qty 0.2

## 2019-07-29 NOTE — Progress Notes (Signed)
[facility] PROGRESS NOTE    Progress Note  [name]  YME:[id number] DOB: 11-Oct-1975 DOA: 07/22/2019 PCP: Tamsen Roers, MD     Brief Narrative:   [name] is an 43 y.o. male past medical history significant of hypertension, GERD presents to the ED with worsening cough and shortness of breath along with fever for 1 week prior to admission.  He relate he tested positive for his SARS-CoV-2 3 days prior to admission.  Since then he has a productive cough shortness of breath wheezing and chest tightness with decreased appetite.  He was admitted to the hospital and was placed on 10 L of oxygen for nonrebreather, he restarted on IV remdesivir steroids, was given convalescent plasma and Actemra on 07/25/2019.  Despite this he continued to be short of breath placement today on a heated high flow.  Assessment/Plan:   Acute respiratory failure with hypoxia due to COVID-19 viral pneumonia, now with ARDS: He is currently on heated high flow 100% and a nonrebreather with saturations greater than 88%. His respiratory rate has been ranging anywhere from 20-25. Continue IV remdesivir, steroids vitamin C and zinc. His inflammatory markers are significantly improved despite this his oxygen requirements are increasing. He did receive convalescent plasma and Actemra on 07/24/2019. Try to keep the patient peripherally 16 hours a day.  Essential hypertension: Blood pressure fairly controlled.  Thrombocytopenia/Leukopenia Likely due to COVID-19 rule out improving this morning.  Newly diagnosed diabetes mellitus type 2: With an A1c of 6.7 on no oral hypoglycemic agents at home, he was placed on dexamethasone here in the hospital his blood glucose has been erratic. Blood glucose continues to be significantly elevated we will start him on long-acting insulin continue sliding scale moderate  DVT prophylaxis: lovenox Family Communication:none Disposition Plan/Barrier to D/C: Unable to  determine. Code Status:     Code Status Orders  (From admission, onward)         Start     Ordered   07/22/19 1301  Full code  Continuous     07/22/19 1302        Code Status History    Date Active Date Inactive Code Status Order ID Comments User Context   09/29/2017 1636 10/02/2017 1959 Full Code [id number]  Leandrew Koyanagi, MD Inpatient   04/18/2017 0042 04/21/2017 1909 Full Code [id number]  Edwin Dada, MD Inpatient   Advance Care Planning Activity        IV Access:    Peripheral IV   Procedures and diagnostic studies:   DG CHEST PORT 1 VIEW  Result Date: 07/28/2019 CLINICAL DATA:  Dyspnea. EXAM: PORTABLE CHEST 1 VIEW COMPARISON:  Chest x-ray dated July 22, 2019. FINDINGS: The heart size and mediastinal contours are within normal limits. Progressive bilateral interstitial and patchy airspace opacities. No pleural effusion or pneumothorax. No acute osseous abnormality. IMPRESSION: 1. Progressive multifocal pneumonia. Electronically Signed   By: Titus Dubin M.D.   On: 07/28/2019 11:14     Medical Consultants:    None.  Anti-Infectives:   IV remdesivir.  Subjective:    [name] relates his breathing is not better than yesterday.  Objective:    Vitals:   07/28/19 1750 07/28/19 2000 07/28/19 2010 07/28/19 2100  BP: 134/80 (!) 146/92 (!) 146/92   Pulse: 91 84 92 85  Resp: 17 (!) 21 (!) 22 18  Temp: 98.8 F (37.1 C) 98.1 F (36.7 C)    TempSrc: Axillary Oral    SpO2: (!) 88% Marland Kitchen)  87% (!) 87% (!) 88%  Weight:       SpO2: (!) 88 % O2 Flow Rate (L/min): 20 L/min FiO2 (%): 100 %   Intake/Output Summary (Last 24 hours) at 07/29/2019 0733 Last data filed at 07/29/2019 0530 Gross per 24 hour  Intake 600 ml  Output 1275 ml  Net -675 ml   Filed Weights   07/22/19 1300  Weight: 136.1 kg    Exam: General exam: In no acute distress. Respiratory system: Good air movement and diffuse crackles bilaterally Cardiovascular system: S1  & S2 heard, RRR. No JVD. Gastrointestinal system: Abdomen is nondistended, soft and nontender.  Central nervous system: Alert and oriented. No focal neurological deficits. Extremities: No pedal edema. Skin: No rashes, lesions or ulcers Psychiatry: Judgement and insight appear normal. Mood & affect appropriate.    Data Reviewed:    Labs: Basic Metabolic Panel: Recent Labs  Lab 07/23/19 0449 07/24/19 0450 07/25/19 0536 07/26/19 0031 07/27/19 0104 07/27/19 1708  NA 137 139 139 142 142  --   K 3.6 3.7 4.0 3.9 4.3  --   CL 96* 97* 97* 100 99  --   CO2 30 28 29 30 31   --   GLUCOSE 156* 165* 171* 168* 164*  --   BUN 16 22* 27* 30* 37*  --   CREATININE 0.94 0.87 0.75 0.80 0.99  --   CALCIUM 8.5* 8.8* 8.7* 8.6* 8.6*  --   MG 2.2 2.5* 2.5* 2.7* 2.6* 2.4  PHOS 4.0 4.1 3.5 3.5 3.9  --    GFR Estimated Creatinine Clearance: 127.3 mL/min (by C-G formula based on SCr of 0.99 mg/dL). Liver Function Tests: Recent Labs  Lab 07/23/19 0449 07/24/19 0450 07/25/19 0536 07/26/19 0031 07/27/19 0104  AST 56* 50* 40 34 30  ALT 40 38 33 34 40  ALKPHOS 49 47 49 50 50  BILITOT 0.3 0.8 0.6 0.8 1.1  PROT 6.9 7.3 6.8 7.0 7.0  ALBUMIN 3.1* 3.3* 3.1* 3.4* 3.6   No results for input(s): LIPASE, AMYLASE in the last 168 hours. No results for input(s): AMMONIA in the last 168 hours. Coagulation profile No results for input(s): INR, PROTIME in the last 168 hours. COVID-19 Labs  Recent Labs    07/27/19 0104  DDIMER 0.48  CRP 2.2*    Lab Results  Component Value Date   SARSCOV2NAA Detected (A) 07/19/2019   SARSCOV2NAA NOT DETECTED 04/29/2019   [place] NEGATIVE 04/17/2019    CBC: Recent Labs  Lab 07/23/19 0449 07/24/19 0450 07/25/19 0536 07/26/19 0031 07/27/19 0104  WBC 2.2* 6.3 4.9 4.8 6.0  NEUTROABS 1.4* 4.9 3.7 3.5 4.7  HGB 14.6 14.4 14.3 13.7 14.2  HCT 44.9 45.5 45.2 43.4 45.0  MCV 84.6 84.4 85.3 85.3 85.1  PLT 96* 130* 129* 136* 141*   Cardiac Enzymes: No results  for input(s): CKTOTAL, CKMB, CKMBINDEX, TROPONINI in the last 168 hours. BNP (last 3 results) No results for input(s): PROBNP in the last 8760 hours. CBG: Recent Labs  Lab 07/27/19 2041 07/28/19 0807 07/28/19 1141 07/28/19 1753 07/28/19 1954  GLUCAP 128* 110* 133* 144* 203*   D-Dimer: Recent Labs    07/27/19 0104  DDIMER 0.48   Hgb A1c: No results for input(s): HGBA1C in the last 72 hours. Lipid Profile: No results for input(s): CHOL, HDL, LDLCALC, TRIG, CHOLHDL, LDLDIRECT in the last 72 hours. Thyroid function studies: No results for input(s): TSH, T4TOTAL, T3FREE, THYROIDAB in the last 72 hours.  Invalid input(s): FREET3 Anemia work up:  No results for input(s): VITAMINB12, FOLATE, FERRITIN, TIBC, IRON, RETICCTPCT in the last 72 hours. Sepsis Labs: Recent Labs  Lab 07/22/19 1039 07/22/19 1315 07/24/19 0450 07/25/19 0536 07/26/19 0031 07/27/19 0104  PROCALCITON <0.10  --   --   --   --   --   WBC 2.8*  --  6.3 4.9 4.8 6.0  LATICACIDVEN 1.2 1.1  --   --   --   --    Microbiology Recent Results (from the past 240 hour(s))  Blood Culture (routine x 2)     Status: None   Collection Time: 07/22/19 11:57 AM   Specimen: BLOOD  Result Value Ref Range Status   Specimen Description BLOOD SITE NOT SPECIFIED  Final   Special Requests   Final    BOTTLES DRAWN AEROBIC AND ANAEROBIC Blood Culture adequate volume   Culture   Final    NO GROWTH 5 DAYS Performed at [facility], [address] [place], [place] [address]    Report Status 07/27/2019 FINAL  Final  Culture, sputum-assessment     Status: None   Collection Time: 07/23/19 12:17 AM   Specimen: Sputum  Result Value Ref Range Status   Specimen Description SPUTUM  Final   Special Requests NONE  Final   Sputum evaluation   Final    THIS SPECIMEN IS ACCEPTABLE FOR SPUTUM CULTURE Performed at [facility], [place] [address], [place], [place] [address]    Report Status 07/23/2019 FINAL  Final  Culture,  respiratory     Status: None   Collection Time: 07/23/19 12:17 AM   Specimen: SPU  Result Value Ref Range Status   Specimen Description SPUTUM  Final   Special Requests NONE Reflexed from [id number]  Final   Gram Stain   Final    FEW WBC PRESENT, PREDOMINANTLY PMN ABUNDANT GRAM NEGATIVE COCCOBACILLI RARE GRAM POSITIVE COCCI    Culture   Final    Consistent with normal respiratory flora. Performed at [facility], [place] [address], [place], [place] [address]    Report Status 07/25/2019 FINAL  Final     Medications:   . albuterol  2 puff Inhalation Q6H  . vitamin C  500 mg Oral Daily  . dexamethasone (DECADRON) injection  6 mg Intravenous Q12H  . enoxaparin (LOVENOX) injection  70 mg Subcutaneous Daily  . insulin aspart  0-15 Units Subcutaneous TID WC  . insulin aspart  0-5 Units Subcutaneous QHS  . pantoprazole  40 mg Oral Daily  . zinc sulfate  220 mg Oral Daily   Continuous Infusions: . remdesivir 100 mg in NS 100 mL Stopped (07/28/19 1300)      LOS: 7 days   Charlynne Cousins  [facility]  07/29/2019, 7:33 AM

## 2019-07-29 NOTE — Evaluation (Signed)
Physical Therapy Evaluation Patient Details Name: [name] MRN: [id number] DOB: [birth year] Today's Date: 07/29/2019   History of Present Illness  43 y.o. male past medical history significant of hypertension, asthma, GERD, I&D rt distal clavicle presents to the ED with worsening cough and shortness of breath along with fever for 1 week prior to admission.  He relate he tested positive for his SARS-CoV-2 3 days prior to admission 07/22/19. Covid pneumonia progressed to ARDS.  Clinical Impression   Pt admitted with above diagnosis. Patient currently on [place] 100% FiO2 @ 30L with NRB mask 15L. Very receptive to education re: pursed lip breathing (with good return demonstration and increasing resting sats 87 to 95%; however drops back to 90% with relaxed breathing). Educated on ways to improve prone positioning for comfort and to lean shoulders back slightly in sitting to allow incr diaphragm/lung excursion (due to his obesity and noted he has a tendency to lean forward and hang his head down). Pt with multiple episodes of coughing during session with sats as low as 70% and progressing to dry heaving/nausea. Further mobility deferred. RN in to assess pt and give nausea meds. Pt currently with functional limitations due to the deficits listed below (see PT Problem List). Pt will benefit from skilled PT to increase their independence and safety with mobility to allow discharge to the venue listed below.       Follow Up Recommendations No PT follow up    Equipment Recommendations  None recommended by PT    Recommendations for Other Services OT consult     Precautions / Restrictions Precautions Precautions: Other (comment) Precaution Comments: on HHFNC      Mobility  Bed Mobility               General bed mobility comments: pt up in chair on arrival; educated on proning importance and pt reports he has tried the past 2 nights but is uncomfortable for his back. Educated him (and  later his RN) re: putting his head at the foot of the bed and elevating the foot of the bed to create more flat/flexed position. Educated they may need to turn/angle his bed for O2 to reach his head/nose.   Transfers                 General transfer comment: deferred as each time about to attempt he coughed and desaturated with slow recovery (lowest 70% with 3 minutes to recover to 80%, 5 min to 85%)  Ambulation/Gait                Stairs            Wheelchair Mobility    Modified Rankin (Stroke Patients Only)       Balance                                             Pertinent Vitals/Pain Pain Assessment: No/denies pain    Home Living Family/patient expects to be discharged to:: Private residence Living Arrangements: Spouse/significant other Available Help at Discharge: Family Type of Home: House Home Access: Stairs to enter Entrance Stairs-Rails: Psychiatric nurse of Steps: 4 Home Layout: One level Home Equipment: None      Prior Function Level of Independence: Independent               Hand Dominance   Dominant Hand:  Right    Extremity/Trunk Assessment   Upper Extremity Assessment Upper Extremity Assessment: Defer to OT evaluation    Lower Extremity Assessment Lower Extremity Assessment: Overall WFL for tasks assessed    Cervical / Trunk Assessment Cervical / Trunk Assessment: Other exceptions Cervical / Trunk Exceptions: obesity  Communication   Communication: Other (comment)(limited by dypnea and decr sats)  Cognition Arousal/Alertness: Awake/alert Behavior During Therapy: WFL for tasks assessed/performed Overall Cognitive Status: Within Functional Limits for tasks assessed                                        General Comments General comments (skin integrity, edema, etc.): at rest HR 118; sats 87-89% on 100% FiO2 @30L  with NRB 15L; RR 24;     Exercises Other  Exercises Other Exercises: educated in pursed lip breathing with pt able to incr his sats as high as 95% after 3 minutes (from 87%); by end of session, pt independently using after each coughing episode for quicker recovery of sats.  Other Exercises: use of IS with pt able to pull 1000-1250 ml. Educated on 10 x per hour Other Exercises: educated verbally how to use flutter valve; encouraged to attempt to get 10 breaths per hour, but likely will take time to get to that frequency due to current respiratory status (HHFNC with NRB mask)   Assessment/Plan    PT Assessment Patient needs continued PT services  PT Problem List Decreased activity tolerance;Decreased mobility;Decreased knowledge of precautions;Cardiopulmonary status limiting activity;Obesity       PT Treatment Interventions Gait training;Functional mobility training;Therapeutic activities;Therapeutic exercise;Patient/family education    PT Goals (Current goals can be found in the Care Plan section)  Acute Rehab PT Goals Patient Stated Goal: to feel better  PT Goal Formulation: With patient Time For Goal Achievement: 08/12/19 Potential to Achieve Goals: Good    Frequency Min 3X/week   Barriers to discharge        Co-evaluation               AM-PAC PT "6 Clicks" Mobility  Outcome Measure Help needed turning from your back to your side while in a flat bed without using bedrails?: None Help needed moving from lying on your back to sitting on the side of a flat bed without using bedrails?: None Help needed moving to and from a bed to a chair (including a wheelchair)?: A Little Help needed standing up from a chair using your arms (e.g., wheelchair or bedside chair)?: A Little Help needed to walk in hospital room?: Total Help needed climbing 3-5 steps with a railing? : Total 6 Click Score: 16    End of Session         PT Visit Diagnosis: Difficulty in walking, not elsewhere classified (R26.2)    Time:  1015-1040 PT Time Calculation (min) (ACUTE ONLY): 25 min   Charges:   PT Evaluation $PT Eval Moderate Complexity: 1 Mod PT Treatments $Therapeutic Exercise: 8-22 mins         Arby Barrette, PT Pager [phone]   Jeanie Cooks Lunden Stieber 07/29/2019, 11:00 AM

## 2019-07-30 ENCOUNTER — Inpatient Hospital Stay (HOSPITAL_COMMUNITY): Payer: Managed Care, Other (non HMO)

## 2019-07-30 LAB — BASIC METABOLIC PANEL
Anion gap: 14 (ref 5–15)
BUN: 32 mg/dL — ABNORMAL HIGH (ref 6–20)
CO2: 24 mmol/L (ref 22–32)
Calcium: 8.7 mg/dL — ABNORMAL LOW (ref 8.9–10.3)
Chloride: 100 mmol/L (ref 98–111)
Creatinine, Ser: 0.8 mg/dL (ref 0.61–1.24)
GFR calc Af Amer: >60 mL/min (ref >60)
GFR calc non Af Amer: >60 mL/min (ref >60)
Glucose, Bld: 98 mg/dL (ref 70–99)
Potassium: 4 mmol/L (ref 3.5–5.1)
Sodium: 138 mmol/L (ref 135–145)

## 2019-07-30 LAB — GLUCOSE, CAPILLARY
Glucose-Capillary: 97 mg/dL (ref 70–99)
Glucose-Capillary: 161 mg/dL — ABNORMAL HIGH (ref 70–99)
Glucose-Capillary: 123 mg/dL — ABNORMAL HIGH (ref 70–99)
Glucose-Capillary: 113 mg/dL — ABNORMAL HIGH (ref 70–99)

## 2019-07-30 LAB — PROCALCITONIN: Procalcitonin: <0.1 ng/mL

## 2019-07-30 LAB — D-DIMER, QUANTITATIVE: D-Dimer, Quant: 1.54 ug/mL-FEU — ABNORMAL HIGH (ref 0.00–0.50)

## 2019-07-30 LAB — C-REACTIVE PROTEIN: CRP: 2.4 mg/dL — ABNORMAL HIGH (ref <1.0)

## 2019-07-30 MED ORDER — METOPROLOL TARTRATE 25 MG PO TABS: 25.0000 mg | ORAL_TABLET | Freq: Two times a day (BID) | ORAL | Status: DC

## 2019-07-30 MED ORDER — ORAL CARE MOUTH RINSE
15.0000 mL | Freq: Two times a day (BID) | OROMUCOSAL | Status: DC
  Administered 2019-07-30 – 2019-08-07 (×9): 15 mL via OROMUCOSAL

## 2019-07-30 MED ORDER — METOPROLOL TARTRATE 5 MG/5ML IV SOLN
5.0000 mg | Freq: Once | INTRAVENOUS | Status: AC
  Administered 2019-07-30: 5 mg via INTRAVENOUS

## 2019-07-30 MED ORDER — METOPROLOL TARTRATE 25 MG PO TABS: 50.0000 mg | ORAL_TABLET | Freq: Two times a day (BID) | ORAL | Status: DC

## 2019-07-30 MED ORDER — TOCILIZUMAB 400 MG/20ML IV SOLN
800.0000 mg | Freq: Once | INTRAVENOUS | Status: AC
  Administered 2019-07-30: 800 mg via INTRAVENOUS
  Filled 2019-07-30: qty 40

## 2019-07-30 MED ORDER — METOPROLOL TARTRATE 25 MG PO TABS
25.0000 mg | ORAL_TABLET | Freq: Once | ORAL | Status: AC
  Administered 2019-07-30: 14:00:00 25 mg via ORAL
  Filled 2019-07-30: qty 1

## 2019-07-30 MED ORDER — METOPROLOL TARTRATE 50 MG PO TABS
75.0000 mg | ORAL_TABLET | Freq: Two times a day (BID) | ORAL | Status: DC
  Administered 2019-07-30 – 2019-08-03 (×8): 75 mg via ORAL
  Filled 2019-07-30 (×7): qty 3
  Filled 2019-07-30: qty 1

## 2019-07-30 MED ORDER — OXYCODONE-ACETAMINOPHEN 5-325 MG PO TABS
1.0000 | ORAL_TABLET | ORAL | Status: DC | PRN
  Administered 2019-07-30: 1 via ORAL
  Administered 2019-07-31 – 2019-08-03 (×7): 2 via ORAL
  Filled 2019-07-30: qty 1
  Filled 2019-07-30 (×7): qty 2

## 2019-07-30 MED ORDER — METOPROLOL TARTRATE 5 MG/5ML IV SOLN
5.0000 mg | Freq: Four times a day (QID) | INTRAVENOUS | Status: DC | PRN
  Administered 2019-08-08 – 2019-09-02 (×11): 5 mg via INTRAVENOUS
  Filled 2019-07-30 (×12): qty 5

## 2019-07-30 NOTE — Progress Notes (Signed)
Occupational Therapy Evaluation Patient Details Name: [name] MRN: [id number] DOB: February 12, 1976 Today's Date: 07/30/2019    History of Present Illness 43 y.o. male past medical history significant of hypertension, asthma, GERD, I&D rt distal clavicle presents to the ED with worsening cough and shortness of breath along with fever for 1 week prior to admission.  He relate he tested positive for his SARS-CoV-2 3 days prior to admission 07/22/19. Covid pneumonia progressed to ARDS.   Clinical Impression   PTA pt lived with wife and son, independent in ADL, IADL, and mobility tasks. Pt drives and works full time at [employer]. Pt does not use oxygen at home and is currently on 40L HHFNC at 100%FiO2 + 15L NRB. Pt currently independent to min guard for self-care and functional transfer tasks. Pt tolerated sitting edge of bed 20+ min while engaging in simple self-care tasks. SpO2 decreased into 70s with transfer and while seated edge of bed. Pt reported min shortness of breath with no signs/symptoms of distress. Educated pt on breathing exercises with good understanding and follow through. Educated and provided pt with handouts regarding energy conservation and relaxation strategies with good understanding. Pt able to transfer to bedside chair with min guard. Noted desat to 69% during transfer. Pt's SpO2 fluctuated between 69%-90% throughout session.  Changed from finger to ear probe noting improved readings into 80s. Pt reports min shortness of breath throughout. 2/4 DOE. Updated RN as pt's SpO2 in mid 80s at end of session with no signs/symptoms of distress. Pt demonstrates decreased strength, endurance, balance, standing tolerance, and activity tolerance impacting ability to complete self-care and functional transfer tasks. Recommend skilled OT services to address above deficits in order to promote function and prevent further decline.     Follow Up Recommendations  No OT follow up     Equipment Recommendations  3 in 1 bedside commode(for use in shower)    Recommendations for Other Services       Precautions / Restrictions Precautions Precautions: Other (comment) Precaution Comments: 40L HHFNC at 100%FiO2 + 15L NRB Restrictions Weight Bearing Restrictions: No      Mobility Bed Mobility Overal bed mobility: Modified Independent             General bed mobility comments: HOB flat, use of bedrail and increased time  Transfers Overall transfer level: Needs assistance Equipment used: None Transfers: Sit to/from [facility] Sit to Stand: Min guard Stand pivot transfers: Min guard       General transfer comment: Pt able to stand pivot to bedside chair with min guard. No instances of LOB. Limited tolerance due to SOB.    Balance Overall balance assessment: Mild deficits observed, not formally tested                                         ADL either performed or assessed with clinical judgement   ADL Overall ADL's : Needs assistance/impaired Eating/Feeding: Independent;Sitting   Grooming: Supervision/safety;Set up;Sitting   Upper Body Bathing: Supervision/ safety;Set up;Sitting   Lower Body Bathing: Supervison/ safety;Set up;Sit to/from stand;Sitting/lateral leans   Upper Body Dressing : Supervision/safety;Set up;Sitting   Lower Body Dressing: Supervision/safety;Set up;Sitting/lateral leans;Sit to/from stand(use of figure four)   Toilet Transfer: Min Conservation officer, nature and Hygiene: Min guard;Sit to/from stand;Sitting/lateral lean       Functional mobility during ADLs: Min guard  Vision Baseline Vision/History: No visual deficits       Perception     Praxis      Pertinent Vitals/Pain Pain Assessment: 0-10 Pain Score: 8  Pain Location: hips and back Pain Descriptors / Indicators: Aching Pain Intervention(s): Monitored during session(RN provided pain meds prior to  session)     Hand Dominance Right   Extremity/Trunk Assessment Upper Extremity Assessment Upper Extremity Assessment: Generalized weakness   Lower Extremity Assessment Lower Extremity Assessment: Defer to PT evaluation       Communication Communication Communication: No difficulties   Cognition Arousal/Alertness: Awake/alert Behavior During Therapy: WFL for tasks assessed/performed Overall Cognitive Status: Within Functional Limits for tasks assessed                                     General Comments  Educated and provided pt with handouts regarding energy conservation and relaxation strategies. HR 110-118. RR 16-20s. Min shortness of breath throughout. SpO2 69-90% on 40L HHFNC at 100%FiO2 + 15L NRB    Exercises Exercises: Other exercises Other Exercises Other Exercises: Incentive spirometer x 10 with min cues on technique. Pulling 1263m Other Exercises: Flutter valve x 10 with min cues on technique.   Shoulder Instructions      Home Living Family/patient expects to be discharged to:: Private residence Living Arrangements: Spouse/significant other Available Help at Discharge: Family Type of Home: House Home Access: Stairs to enter ETechnical brewerof Steps: 4 Entrance Stairs-Rails: Right;Left Home Layout: One level     Bathroom Shower/Tub: WOccupational psychologist [place] None          Prior Functioning/Environment Level of Independence: Independent        Comments: Pt independent in ADLs, IADLs, and mobility. Pt drives and works at [employer]. Pt does not use oxygen at home.        OT Problem List: Decreased strength;Decreased activity tolerance;Impaired balance (sitting and/or standing);Cardiopulmonary status limiting activity;Decreased knowledge of use of DME or AE      OT Treatment/Interventions: Self-care/ADL training;Therapeutic exercise;Neuromuscular education;Energy  conservation;DME and/or AE instruction;Therapeutic activities;Patient/family education;Balance training    OT Goals(Current goals can be found in the care plan section) Acute Rehab OT Goals Patient Stated Goal: To go home Time For Goal Achievement: 08/13/19 Potential to Achieve Goals: Good ADL Goals Pt Will Perform Grooming: standing;Independently Pt Will Perform Lower Body Bathing: Independently;sit to/from stand Pt Will Perform Lower Body Dressing: Independently;sit to/from stand Pt Will Transfer to Toilet: Independently;ambulating;regular height toilet Pt Will Perform Toileting - Clothing Manipulation and hygiene: Independently;sit to/from stand Additional ADL Goal #1: Pt to recall and verbalize 3 energy conservation strategies with 0 verbal cues. Additional ADL Goal #2: Pt to tolerate standing up to 5 min independently with SpO2 maintaining above 90%, in preparation for ADLs.  OT Frequency: Min 2X/week   Barriers to D/C:            Co-evaluation              AM-PAC OT "6 Clicks" Daily Activity     Outcome Measure Help from another person eating meals?: None Help from another person taking care of personal grooming?: A Little Help from another person toileting, which includes using toliet, bedpan, or urinal?: A Little Help from another person bathing (including washing, rinsing, drying)?: A Little Help from another person to put on and taking off regular  upper body clothing?: A Little Help from another person to put on and taking off regular lower body clothing?: A Little 6 Click Score: 19   End of Session Equipment Utilized During Treatment: Oxygen Nurse Communication: Mobility status  Activity Tolerance: Patient limited by fatigue(Limited by SOB) Patient left: in chair;with call bell/phone within reach  OT Visit Diagnosis: Unsteadiness on feet (R26.81);Muscle weakness (generalized) (M62.81)                Time: 6948-5462 OT Time Calculation (min): 58 min Charges:   OT General Charges $OT Visit: 1 Visit OT Evaluation $OT Eval Moderate Complexity: 1 Mod OT Treatments $Self Care/Home Management : 8-22 mins $Therapeutic Activity: 8-22 mins $Therapeutic Exercise: 8-22 mins  Mauri Brooklyn OTR/L [phone]   Mauri Brooklyn 07/30/2019, 9:06 AM

## 2019-07-30 NOTE — Progress Notes (Signed)
Reviewed case with primary as well as imaging. Pneumomediastinum in patient with severe COVID. Okay to keep in PCU. Try to suppress cough. Intubating him will likely just make this worse but available if he runs out of options.  Erskine Emery MD PCCM

## 2019-07-30 NOTE — Progress Notes (Addendum)
[facility] PROGRESS NOTE    Progress Note  [name]  GUR:[id number] DOB: 12/04/1975 DOA: 07/22/2019 PCP: Tamsen Roers, MD     Brief Narrative:   [name] is an 43 y.o. male past medical history significant of hypertension, GERD presents to the ED with worsening cough and shortness of breath along with fever for 1 week prior to admission.  He relate he tested positive for his SARS-CoV-2 3 days prior to admission.  Since then he has a productive cough shortness of breath wheezing and chest tightness with decreased appetite.  He was admitted to the hospital and was placed on 10 L of oxygen for nonrebreather, he restarted on IV remdesivir steroids, was given convalescent plasma and Actemra on 07/25/2019.  Despite this he continued to be short of breath placement today on a heated high flow.  Assessment/Plan:   Acute respiratory failure with hypoxia due to COVID-19 viral pneumonia, now with ARDS: He is currently requiring heated high flow with an FiO2 of 100% at a flow rate of 40-minute and nonrebreather to keep saturations greater than 81-87 %.  He is currently using accessory muscles to breathe and his respiratory rate is about 20-25. We will have a low threshold to transfer the patient to the ICU. Continue IV remdesivir and steroids for total of 10 days, continue vitamin C and zinc. He is status post convalescent plasma and Actemra on 07/14/2019. His inflammatory markers were improving but no other trending up, his procalcitonin was less than 0.1. We will give him an additional dose of IV Actemra, his D-dimer was low now with slowly rising.  Continue to follow CRP and D-dimer closely To keep the patient peripherally 16 hours a day, not prone out of bed to chair. Check a Ct chest.  Essential hypertension: Blood pressure fairly controlled.  Sinus tachycardia: No events on telemetry, likely due to stress started him on metoprolol orally continue metoprolol IV as needed for  heart rate greater than 100.  Thrombocytopenia/Leukopenia Likely due to COVID-19, now resolved.  Newly diagnosed diabetes mellitus type 2: With an A1c of 6.7, no oral hypoglycemic agents at home, he was placed on dexamethasone and his blood glucose became erratic. Currently is fairly controlled continue long-acting insulin plus sliding scale.  DVT prophylaxis: lovenox Family Communication:none Disposition Plan/Barrier to D/C: Unable to determine. Code Status:     Code Status Orders  (From admission, onward)         Start     Ordered   07/22/19 1301  Full code  Continuous     07/22/19 1302        Code Status History    Date Active Date Inactive Code Status Order ID Comments User Context   09/29/2017 1636 10/02/2017 1959 Full Code [id number]  Leandrew Koyanagi, MD Inpatient   04/18/2017 0042 04/21/2017 1909 Full Code [id number]  Edwin Dada, MD Inpatient   Advance Care Planning Activity        IV Access:    Peripheral IV   Procedures and diagnostic studies:   DG CHEST PORT 1 VIEW  Result Date: 07/28/2019 CLINICAL DATA:  Dyspnea. EXAM: PORTABLE CHEST 1 VIEW COMPARISON:  Chest x-ray dated July 22, 2019. FINDINGS: The heart size and mediastinal contours are within normal limits. Progressive bilateral interstitial and patchy airspace opacities. No pleural effusion or pneumothorax. No acute osseous abnormality. IMPRESSION: 1. Progressive multifocal pneumonia. Electronically Signed   By: Titus Dubin M.D.   On: 07/28/2019 11:14  Medical Consultants:    None.  Anti-Infectives:   IV remdesivir.  Subjective:    [name] he relates his breathing is not improved compared to yesterday, has a decreased appetite.  Objective:    Vitals:   07/29/19 2300 07/30/19 0239 07/30/19 0258 07/30/19 0713  BP:  (!) 109/54  113/66  Pulse:  93  96  Resp: 18 19  18   Temp:  97.9 F (36.6 C)  99.4 F (37.4 C)  TempSrc:  Axillary  Axillary  SpO2: (!) 89% (!)  85% (!) 87% (!) 89%  Weight:       SpO2: (!) 89 % O2 Flow Rate (L/min): 40 L/min FiO2 (%): 100 %   Intake/Output Summary (Last 24 hours) at 07/30/2019 0823 Last data filed at 07/30/2019 0700 Gross per 24 hour  Intake 1140 ml  Output 925 ml  Net 215 ml   Filed Weights   07/22/19 1300  Weight: 136.1 kg    Exam: General exam: In no acute distress. Respiratory system: Good air movement and diffuse crackles bilaterally. Cardiovascular system: S1 & S2 heard, RRR. No JVD. Gastrointestinal system: Abdomen is nondistended, soft and nontender.  Central nervous system: Alert and oriented. No focal neurological deficits. Extremities: No pedal edema. Skin: No rashes, lesions or ulcers Psychiatry: Judgement and insight appear normal. Mood & affect appropriate.    Data Reviewed:    Labs: Basic Metabolic Panel: Recent Labs  Lab 07/24/19 0450 07/25/19 0536 07/26/19 0031 07/27/19 0104 07/27/19 1708 07/29/19 0500  NA 139 139 142 142  --  138  K 3.7 4.0 3.9 4.3  --  4.1  CL 97* 97* 100 99  --  101  CO2 28 29 30 31   --  23  GLUCOSE 165* 171* 168* 164*  --  115*  BUN 22* 27* 30* 37*  --  33*  CREATININE 0.87 0.75 0.80 0.99  --  0.74  CALCIUM 8.8* 8.7* 8.6* 8.6*  --  8.8*  MG 2.5* 2.5* 2.7* 2.6* 2.4  --   PHOS 4.1 3.5 3.5 3.9  --   --    GFR Estimated Creatinine Clearance: 157.5 mL/min (by C-G formula based on SCr of 0.74 mg/dL). Liver Function Tests: Recent Labs  Lab 07/24/19 0450 07/25/19 0536 07/26/19 0031 07/27/19 0104  AST 50* 40 34 30  ALT 38 33 34 40  ALKPHOS 47 49 50 50  BILITOT 0.8 0.6 0.8 1.1  PROT 7.3 6.8 7.0 7.0  ALBUMIN 3.3* 3.1* 3.4* 3.6   No results for input(s): LIPASE, AMYLASE in the last 168 hours. No results for input(s): AMMONIA in the last 168 hours. Coagulation profile No results for input(s): INR, PROTIME in the last 168 hours. COVID-19 Labs  Recent Labs    07/29/19 0500  CRP 2.3*    Lab Results  Component Value Date   SARSCOV2NAA  Detected (A) 07/19/2019   SARSCOV2NAA NOT DETECTED 04/29/2019   [place] NEGATIVE 04/17/2019    CBC: Recent Labs  Lab 07/24/19 0450 07/25/19 0536 07/26/19 0031 07/27/19 0104  WBC 6.3 4.9 4.8 6.0  NEUTROABS 4.9 3.7 3.5 4.7  HGB 14.4 14.3 13.7 14.2  HCT 45.5 45.2 43.4 45.0  MCV 84.4 85.3 85.3 85.1  PLT 130* 129* 136* 141*   Cardiac Enzymes: No results for input(s): CKTOTAL, CKMB, CKMBINDEX, TROPONINI in the last 168 hours. BNP (last 3 results) No results for input(s): PROBNP in the last 8760 hours. CBG: Recent Labs  Lab 07/29/19 0742 07/29/19 1108 07/29/19 1527 07/29/19  2001 07/30/19 0716  GLUCAP 118* 128* 125* 135* 97   D-Dimer: No results for input(s): DDIMER in the last 72 hours. Hgb A1c: No results for input(s): HGBA1C in the last 72 hours. Lipid Profile: No results for input(s): CHOL, HDL, LDLCALC, TRIG, CHOLHDL, LDLDIRECT in the last 72 hours. Thyroid function studies: No results for input(s): TSH, T4TOTAL, T3FREE, THYROIDAB in the last 72 hours.  Invalid input(s): FREET3 Anemia work up: No results for input(s): VITAMINB12, FOLATE, FERRITIN, TIBC, IRON, RETICCTPCT in the last 72 hours. Sepsis Labs: Recent Labs  Lab 07/24/19 0450 07/25/19 0536 07/26/19 0031 07/27/19 0104 07/29/19 0500  PROCALCITON  --   --   --   --  <0.10  WBC 6.3 4.9 4.8 6.0  --    Microbiology Recent Results (from the past 240 hour(s))  Blood Culture (routine x 2)     Status: None   Collection Time: 07/22/19 11:57 AM   Specimen: BLOOD  Result Value Ref Range Status   Specimen Description BLOOD SITE NOT SPECIFIED  Final   Special Requests   Final    BOTTLES DRAWN AEROBIC AND ANAEROBIC Blood Culture adequate volume   Culture   Final    NO GROWTH 5 DAYS Performed at [facility], [address] [place], [place] [address]    Report Status 07/27/2019 FINAL  Final  Culture, sputum-assessment     Status: None   Collection Time: 07/23/19 12:17 AM   Specimen: Sputum   Result Value Ref Range Status   Specimen Description SPUTUM  Final   Special Requests NONE  Final   Sputum evaluation   Final    THIS SPECIMEN IS ACCEPTABLE FOR SPUTUM CULTURE Performed at [facility], [place] [address], [place], [place] [address]    Report Status 07/23/2019 FINAL  Final  Culture, respiratory     Status: None   Collection Time: 07/23/19 12:17 AM   Specimen: SPU  Result Value Ref Range Status   Specimen Description SPUTUM  Final   Special Requests NONE Reflexed from [id number]  Final   Gram Stain   Final    FEW WBC PRESENT, PREDOMINANTLY PMN ABUNDANT GRAM NEGATIVE COCCOBACILLI RARE GRAM POSITIVE COCCI    Culture   Final    Consistent with normal respiratory flora. Performed at [facility], [place] [address], [place], [place] [address]    Report Status 07/25/2019 FINAL  Final     Medications:   . albuterol  2 puff Inhalation Q6H  . vitamin C  500 mg Oral Daily  . dexamethasone (DECADRON) injection  6 mg Intravenous Q24H  . enoxaparin (LOVENOX) injection  70 mg Subcutaneous Daily  . insulin aspart  0-15 Units Subcutaneous TID WC  . insulin aspart  0-5 Units Subcutaneous QHS  . insulin detemir  20 Units Subcutaneous BID  . metoprolol tartrate  25 mg Oral BID  . pantoprazole  40 mg Oral Daily  . zinc sulfate  220 mg Oral Daily   Continuous Infusions: . remdesivir 100 mg in NS 100 mL 100 mg (07/29/19 0953)      LOS: 8 days   Charlynne Cousins  [facility]  07/30/2019, 8:23 AM

## 2019-07-30 NOTE — Plan of Care (Signed)
  Problem: Education: Goal: Knowledge of risk factors and measures for prevention of condition will improve Outcome: Progressing   Problem: Coping: Goal: Psychosocial and spiritual needs will be supported Outcome: Progressing   Problem: Respiratory: Goal: Will maintain a patent airway Outcome: Progressing Goal: Complications related to the disease process, condition or treatment will be avoided or minimized Outcome: Progressing   

## 2019-07-30 NOTE — Progress Notes (Signed)
Spoke with pt's wife via phone. She did not have questions as she had just finished speaking with provider, who suggested she recruit family to boost pt's attitude and morale. Pt's wife requested that I facilitate a video chat between the pt and his mother. We did this, and the pt felt that it was greatly beneficial in his will to do the work that he needs to continue recovering.

## 2019-07-31 ENCOUNTER — Inpatient Hospital Stay (HOSPITAL_COMMUNITY): Payer: Managed Care, Other (non HMO)

## 2019-07-31 LAB — BASIC METABOLIC PANEL
Anion gap: 10 (ref 5–15)
BUN: 31 mg/dL — ABNORMAL HIGH (ref 6–20)
CO2: 25 mmol/L (ref 22–32)
Calcium: 8.5 mg/dL — ABNORMAL LOW (ref 8.9–10.3)
Chloride: 101 mmol/L (ref 98–111)
Creatinine, Ser: 0.83 mg/dL (ref 0.61–1.24)
GFR calc Af Amer: >60 mL/min (ref >60)
GFR calc non Af Amer: >60 mL/min (ref >60)
Glucose, Bld: 87 mg/dL (ref 70–99)
Potassium: 4.6 mmol/L (ref 3.5–5.1)
Sodium: 136 mmol/L (ref 135–145)

## 2019-07-31 LAB — C-REACTIVE PROTEIN: CRP: 1.7 mg/dL — ABNORMAL HIGH (ref <1.0)

## 2019-07-31 LAB — GLUCOSE, CAPILLARY
Glucose-Capillary: 90 mg/dL (ref 70–99)
Glucose-Capillary: 149 mg/dL — ABNORMAL HIGH (ref 70–99)
Glucose-Capillary: 112 mg/dL — ABNORMAL HIGH (ref 70–99)
Glucose-Capillary: 110 mg/dL — ABNORMAL HIGH (ref 70–99)

## 2019-07-31 LAB — D-DIMER, QUANTITATIVE: D-Dimer, Quant: 2.37 ug/mL-FEU — ABNORMAL HIGH (ref 0.00–0.50)

## 2019-07-31 LAB — PROCALCITONIN: Procalcitonin: <0.1 ng/mL

## 2019-07-31 MED ORDER — BENZONATATE 100 MG PO CAPS
100.0000 mg | ORAL_CAPSULE | Freq: Three times a day (TID) | ORAL | Status: DC
  Administered 2019-07-31 – 2019-08-03 (×10): 100 mg via ORAL
  Filled 2019-07-31 (×10): qty 1

## 2019-07-31 NOTE — Progress Notes (Signed)
Pt's wife, [name], called for an update. She was specifically interested in the pt's CXR from this AM. I told her that the discussion during rounds described the CXR as "unchanged" but also included our success in weaning O2 requirements and ambulating about the room today. She asked permission to bring pt's sister in-law into the conversation, who is a Adult nurse in some capacity. I updated them together, and they were grateful for the positive news.

## 2019-07-31 NOTE — Progress Notes (Signed)
O2 sensor replaced and o2 sat level 90% obtained. Bp 129/73, heart rate 90's, resp rate maintained in low 20's. Will monitor

## 2019-07-31 NOTE — Progress Notes (Signed)
02 saturation maintained from mid to upper 80's all shift. Patient remains on HFNC and non-rebreather. Patient resting in bed, currently in prone position. Will continue to monitor.

## 2019-07-31 NOTE — Progress Notes (Signed)
PROGRESS NOTE    [name]  VHQ:[id number] DOB: 11/15/1975 DOA: 07/22/2019 PCP: Tamsen Roers, MD    Brief Narrative:  43 year old gentleman with history of hypertension, GERD and asthma who presented to the emergency department with worsening cough and shortness of breath with fever for about 1 week.  He was tested COVID-19 3 days prior to admission.  In the emergency room he was hypoxic with oxygen saturation 88% on room air and placed on 2 L oxygen.  Chest x-ray showed pneumonia.  Patient was admitted to hospital and subsequently placed on 10 L of nonrebreather, he was treated with remdesivir and steroids, given convalescent plasma and Actemra on 07/25/2019.  Patient continues to require high flow oxygen and has developed pneumomediastinum.   Assessment & Plan:   Principal Problem:   Acute respiratory failure with hypoxia (HCC) Active Problems:   GERD (gastroesophageal reflux disease)   Essential hypertension   Asthma   Thrombocytopenia (HCC)   Leukopenia   Acute respiratory distress syndrome (ARDS) due to COVID-19 virus (HCC)  Acute hypoxemic respiratory failure due to COVID-19 viral infection, COVID-19 pneumonia with ARDS, complicated by pneumomediastinum: Continue to monitor due to significant symptoms, as much he can do we will continue to do chest physiotherapy, incentive spirometry, deep breathing exercises, sputum induction, mucolytic's and bronchodilators. Suppress cough with scheduled cough medications and Tessalon Perles. Supplemental oxygen to keep saturations more than 85%. Covid directed therapy with , steroids, on dexamethasone remdesivir, on extended therapy, will continue for 10 days. actemra and convalescent plasma given on 07/25/2019. Due to severity of symptoms, patient will need daily inflammatory markers, chest x-rays, liver function test to monitor and direct COVID-19 therapies. Appreciate pulmonology input, will need close monitor for pneumomediastinum,  no intervention possible.  Hypertension: Blood pressure stable.  Newly diagnosed type 2 diabetes: Hemoglobin A1c 6.7.  Not on treatment at home.  Blood sugars elevated with concomitant use of steroids.  Continue insulin coverage.  Patient remains critically ill.  DVT prophylaxis: Lovenox subcu Code Status: Full code.   Family Communication: None.  Patient stated he is communicating with his wife Disposition Plan: Remains critically ill.   Consultants:   PCCM  Procedures:   None  Antimicrobials:  Anti-infectives (From admission, onward)   Start     Dose/Rate Route Frequency Ordered Stop   07/27/19 1000  remdesivir 100 mg in sodium chloride 0.9 % 100 mL IVPB     100 mg 200 mL/hr over 30 Minutes Intravenous Daily 07/26/19 1442 07/31/19 0948   07/23/19 1000  remdesivir 100 mg in sodium chloride 0.9 % 100 mL IVPB     100 mg 200 mL/hr over 30 Minutes Intravenous Daily 07/22/19 1302 07/26/19 1046   07/22/19 1400  remdesivir 200 mg in sodium chloride 0.9% 250 mL IVPB     200 mg 580 mL/hr over 30 Minutes Intravenous Once 07/22/19 1302 07/22/19 1512         Subjective: Patient seen and examined.  Overnight he remained on very high requirement of oxygen.  Currently on both high flow nasal cannula and nonrebreather.  Anxious.  Has some neck pain. "Why this has happened to me?".  I explained to him about the severity of situation.  Objective: Vitals:   07/31/19 1000 07/31/19 1100 07/31/19 1127 07/31/19 1451  BP:      Pulse: 100 91  92  Resp: (!) 23 (!) 23  (!) 21  Temp:      TempSrc:      SpO2: Marland Kitchen)  89% (!) 87% (!) 87%   Weight:        Intake/Output Summary (Last 24 hours) at 07/31/2019 1520 Last data filed at 07/31/2019 1100 Gross per 24 hour  Intake 680.03 ml  Output 595 ml  Net 85.03 ml   Filed Weights   07/22/19 1300  Weight: 136.1 kg    Examination:  General exam: Appears anxious.  Moderate respiratory distress.   Respiratory system: Mostly conducted  airway sounds.  No palpable crepitus.  On high flow oxygen and nonrebreather. Cardiovascular system: S1 & S2 heard, RRR.  Tachycardic.   Gastrointestinal system: Abdomen is nondistended, soft and nontender. Central nervous system: Alert and oriented. No focal neurological deficits. Extremities: Symmetric 5 x 5 power. Skin: No rashes, lesions or ulcers Psychiatry: Judgement and insight appear normal. Mood & affect anxious.    Data Reviewed: I have personally reviewed following labs and imaging studies  CBC: Recent Labs  Lab 07/25/19 0536 07/26/19 0031 07/27/19 0104  WBC 4.9 4.8 6.0  NEUTROABS 3.7 3.5 4.7  HGB 14.3 13.7 14.2  HCT 45.2 43.4 45.0  MCV 85.3 85.3 85.1  PLT 129* 136* 920*   Basic Metabolic Panel: Recent Labs  Lab 07/25/19 0536 07/26/19 0031 07/27/19 0104 07/27/19 1708 07/29/19 0500 07/30/19 0300 07/31/19 0300  NA 139 142 142  --  138 138 136  K 4.0 3.9 4.3  --  4.1 4.0 4.6  CL 97* 100 99  --  101 100 101  CO2 29 30 31   --  23 24 25   GLUCOSE 171* 168* 164*  --  115* 98 87  BUN 27* 30* 37*  --  33* 32* 31*  CREATININE 0.75 0.80 0.99  --  0.74 0.80 0.83  CALCIUM 8.7* 8.6* 8.6*  --  8.8* 8.7* 8.5*  MG 2.5* 2.7* 2.6* 2.4  --   --   --   PHOS 3.5 3.5 3.9  --   --   --   --    GFR: Estimated Creatinine Clearance: 151.8 mL/min (by C-G formula based on SCr of 0.83 mg/dL). Liver Function Tests: Recent Labs  Lab 07/25/19 0536 07/26/19 0031 07/27/19 0104  AST 40 34 30  ALT 33 34 40  ALKPHOS 49 50 50  BILITOT 0.6 0.8 1.1  PROT 6.8 7.0 7.0  ALBUMIN 3.1* 3.4* 3.6   No results for input(s): LIPASE, AMYLASE in the last 168 hours. No results for input(s): AMMONIA in the last 168 hours. Coagulation Profile: No results for input(s): INR, PROTIME in the last 168 hours. Cardiac Enzymes: No results for input(s): CKTOTAL, CKMB, CKMBINDEX, TROPONINI in the last 168 hours. BNP (last 3 results) No results for input(s): PROBNP in the last 8760 hours. HbA1C: No  results for input(s): HGBA1C in the last 72 hours. CBG: Recent Labs  Lab 07/30/19 1129 07/30/19 1625 07/30/19 2136 07/31/19 0735 07/31/19 1124  GLUCAP 161* 123* 113* 90 149*   Lipid Profile: No results for input(s): CHOL, HDL, LDLCALC, TRIG, CHOLHDL, LDLDIRECT in the last 72 hours. Thyroid Function Tests: No results for input(s): TSH, T4TOTAL, FREET4, T3FREE, THYROIDAB in the last 72 hours. Anemia Panel: No results for input(s): VITAMINB12, FOLATE, FERRITIN, TIBC, IRON, RETICCTPCT in the last 72 hours. Sepsis Labs: Recent Labs  Lab 07/29/19 0500 07/30/19 0300 07/31/19 0300  PROCALCITON <0.10 <0.10 <0.10    Recent Results (from the past 240 hour(s))  Blood Culture (routine x 2)     Status: None   Collection Time: 07/22/19 11:57 AM  Specimen: BLOOD  Result Value Ref Range Status   Specimen Description BLOOD SITE NOT SPECIFIED  Final   Special Requests   Final    BOTTLES DRAWN AEROBIC AND ANAEROBIC Blood Culture adequate volume   Culture   Final    NO GROWTH 5 DAYS Performed at [facility], [address], [place], [place] [address]    Report Status 07/27/2019 FINAL  Final  Culture, sputum-assessment     Status: None   Collection Time: 07/23/19 12:17 AM   Specimen: Sputum  Result Value Ref Range Status   Specimen Description SPUTUM  Final   Special Requests NONE  Final   Sputum evaluation   Final    THIS SPECIMEN IS ACCEPTABLE FOR SPUTUM CULTURE Performed at [facility], [place] [address] [place], [place] [address]    Report Status 07/23/2019 FINAL  Final  Culture, respiratory     Status: None   Collection Time: 07/23/19 12:17 AM   Specimen: SPU  Result Value Ref Range Status   Specimen Description SPUTUM  Final   Special Requests NONE Reflexed from [id number]  Final   Gram Stain   Final    FEW WBC PRESENT, PREDOMINANTLY PMN ABUNDANT GRAM NEGATIVE COCCOBACILLI RARE GRAM POSITIVE COCCI    Culture   Final    Consistent with normal respiratory  flora. Performed at [facility], [place] [address], [place], [place] [address]    Report Status 07/25/2019 FINAL  Final         Radiology Studies: CT CHEST WO CONTRAST  Result Date: 07/30/2019 CLINICAL DATA:  43 year old male with dyspnea on exertion. EXAM: CT CHEST WITHOUT CONTRAST TECHNIQUE: Multidetector CT imaging of the chest was performed following the standard protocol without IV contrast. COMPARISON:  Chest radiograph dated 07/28/2019 and 07/22/2019. FINDINGS: Evaluation of this exam is limited in the absence of intravenous contrast. Evaluation is also limited due to respiratory motion artifact. Cardiovascular: There is no cardiomegaly or pericardial effusion. The thoracic aorta and central pulmonary arteries are grossly unremarkable on this noncontrast CT. Mediastinum/Nodes: No hilar or mediastinal adenopathy. The esophagus and the thyroid gland are grossly unremarkable as visualized. There is extensive pneumomediastinum extending superiorly and dissecting through the fat planes of the neck. No mediastinal fluid collection. Lungs/Pleura: Extensive bilateral patchy ground-glass opacities with predominant subpleural distribution most consistent with multifocal pneumonia, possibly atypical or viral in etiology including COVID-19. Clinical correlation is recommended. No pleural effusion or pneumothorax. The central airways are patent. Upper Abdomen: Fatty infiltration of the liver. The visualized upper abdomen is otherwise unremarkable. Musculoskeletal: No chest wall mass or suspicious bone lesions identified. IMPRESSION: 1. Extensive pneumomediastinum. 2. Multifocal pneumonia, possibly atypical or viral in etiology. Clinical correlation is recommended. These results were called by telephone at the time of interpretation on 07/30/2019 at 4:51 pm to provider [facility] , who verbally acknowledged these results. Electronically Signed   By: Anner Crete M.D.   On: 07/30/2019 17:10    DG CHEST PORT 1 VIEW  Result Date: 07/31/2019 CLINICAL DATA:  Shortness of breath EXAM: PORTABLE CHEST 1 VIEW COMPARISON:  07/28/2019 FINDINGS: Cardiomegaly. Diffuse bilateral airspace disease slightly worsening since prior study. No significant effusions. No acute bony abnormality. IMPRESSION: Diffuse severe bilateral airspace disease, slightly worsening since prior study. Electronically Signed   By: Rolm Baptise M.D.   On: 07/31/2019 08:45        Scheduled Meds: . albuterol  2 puff Inhalation Q6H  . vitamin C  500 mg Oral Daily  . benzonatate  100 mg Oral TID  . dexamethasone (DECADRON) injection  6 mg Intravenous Q24H  . enoxaparin (LOVENOX) injection  70 mg Subcutaneous Daily  . insulin aspart  0-15 Units Subcutaneous TID WC  . insulin aspart  0-5 Units Subcutaneous QHS  . insulin detemir  20 Units Subcutaneous BID  . mouth rinse  15 mL Mouth Rinse BID  . metoprolol tartrate  75 mg Oral BID  . pantoprazole  40 mg Oral Daily  . zinc sulfate  220 mg Oral Daily   Continuous Infusions:   LOS: 9 days    Time spent: 30 minutes    Barb Merino, MD [facility] Pager [phone]

## 2019-08-01 ENCOUNTER — Inpatient Hospital Stay (HOSPITAL_COMMUNITY): Payer: Managed Care, Other (non HMO)

## 2019-08-01 DIAGNOSIS — J80 Acute respiratory distress syndrome: Secondary | ICD-10-CM

## 2019-08-01 DIAGNOSIS — U071 COVID-19: Principal | ICD-10-CM

## 2019-08-01 DIAGNOSIS — K219 Gastro-esophageal reflux disease without esophagitis: Secondary | ICD-10-CM

## 2019-08-01 DIAGNOSIS — J45909 Unspecified asthma, uncomplicated: Secondary | ICD-10-CM

## 2019-08-01 LAB — CBC
HCT: 49.5 % (ref 39.0–52.0)
Hemoglobin: 15.9 g/dL (ref 13.0–17.0)
MCH: 26.6 pg (ref 26.0–34.0)
MCHC: 32.1 g/dL (ref 30.0–36.0)
MCV: 82.8 fL (ref 80.0–100.0)
Platelets: 186 10*3/uL (ref 150–400)
RBC: 5.98 MIL/uL — ABNORMAL HIGH (ref 4.22–5.81)
RDW: 13.1 % (ref 11.5–15.5)
WBC: 10.3 10*3/uL (ref 4.0–10.5)
nRBC: 0 % (ref 0.0–0.2)

## 2019-08-01 LAB — D-DIMER, QUANTITATIVE: D-Dimer, Quant: 3.21 ug/mL-FEU — ABNORMAL HIGH (ref 0.00–0.50)

## 2019-08-01 LAB — GLUCOSE, CAPILLARY
Glucose-Capillary: 101 mg/dL — ABNORMAL HIGH (ref 70–99)
Glucose-Capillary: 155 mg/dL — ABNORMAL HIGH (ref 70–99)
Glucose-Capillary: 112 mg/dL — ABNORMAL HIGH (ref 70–99)
Glucose-Capillary: 113 mg/dL — ABNORMAL HIGH (ref 70–99)

## 2019-08-01 LAB — C-REACTIVE PROTEIN: CRP: 0.7 mg/dL (ref <1.0)

## 2019-08-01 MED ORDER — CHLORHEXIDINE GLUCONATE CLOTH 2 % EX PADS
6.0000 | MEDICATED_PAD | Freq: Every day | CUTANEOUS | Status: DC
  Administered 2019-08-01 – 2019-09-08 (×38): 6 via TOPICAL

## 2019-08-01 NOTE — Progress Notes (Signed)
Physical Therapy Treatment Patient Details Name: [name] MRN: [id number] DOB: 06-01-1976 Today's Date: 08/01/2019    History of Present Illness 43 y.o. male past medical history significant of hypertension, asthma, GERD, I&D rt distal clavicle presents to the ED with worsening cough and shortness of breath along with fever for 1 week prior to admission.  He relate he tested positive for his SARS-CoV-2 3 days prior to admission 07/22/19. Covid pneumonia progressed to ARDS.    PT Comments    Pt making steady progress with tx, he was on HHFNC at 25L this am and sats fluctuated with activity. Initially at therapist arrival to room pt was sitting in recliner and sats 86%, with seated therapeutic exercises sats desaturated into low 80s and pt needed increased time (>35mns rest to recover to 90s, pt also used flutter valve to aide in this). With initial sit<>stand completed 1 rep and sats remained in mid 80s, completed x 3 reps and still sats remained in 80s, with x5 reps pt desat to low 70s  Needing approx 3 mins to recover to mid 80s again. With desaturation to low 70s pt became quite erratic needing cues to calm and continue with pursed lip breathing. Pt has been reinforced on importance of completing breathing exercises, has been issued written and illustrated instructions for both UE and BLE exercises to complete during down time. Pt was noted to be coughing and spitting out mucus with attempting deep breathing.    Follow Up Recommendations  No PT follow up     Equipment Recommendations  None recommended by PT    Recommendations for Other Services       Precautions / Restrictions Precautions Precautions: Other (comment)(monitor 02 sats) Restrictions Weight Bearing Restrictions: No    Mobility  Bed Mobility               General bed mobility comments: pt sitting in recliner upon therapist arriva;  Transfers Overall transfer level: Needs assistance Equipment used:  None Transfers: Sit to/from Stand Sit to Stand: Supervision         General transfer comment: worked on sit<>stand from recliner for exercises, pt did well with this. completed x 5 sit<>stand and desat to high 70s with this taking approx 358ms to recover to mid 80s (86%)  Ambulation/Gait             General Gait Details: did not attempt ambulation yet   Stairs             Wheelchair Mobility    Modified Rankin (Stroke Patients Only)       Balance                                            Cognition Arousal/Alertness: Awake/alert Behavior During Therapy: WFL for tasks assessed/performed Overall Cognitive Status: Within Functional Limits for tasks assessed                                        Exercises General Exercises - Upper Extremity Shoulder Flexion: AROM;Strengthening;5 reps;Theraband Theraband Level (Shoulder Flexion): Level 2 (Red) Shoulder Horizontal ABduction: AROM;Strengthening;5 reps;Theraband Theraband Level (Shoulder Horizontal Abduction): Level 2 (Red) Elbow Flexion: AROM;5 reps;Theraband Theraband Level (Elbow Flexion): Level 2 (Red) Elbow Extension: AROM;5 reps;Theraband Theraband Level (Elbow Extension): Level 2 (Red)  General Exercises - Lower Extremity Long Arc Quad: 10 reps;Strengthening Hip Flexion/Marching: 10 reps;Strengthening Toe Raises: 10 reps;Strengthening Heel Raises: 10 reps;Strengthening Other Exercises Other Exercises: flutter valve to increase 02 sats    General Comments        Pertinent Vitals/Pain Pain Assessment: No/denies pain Pain Location: denies pain but states face feels tight    Home Living                      Prior Function            PT Goals (current goals can now be found in the care plan section) Acute Rehab PT Goals Patient Stated Goal: states feels winded with activity and his face feels so stiff PT Goal Formulation: With patient Time For Goal  Achievement: 08/12/19 Potential to Achieve Goals: Fair Progress towards PT goals: Progressing toward goals    Frequency    Min 3X/week      PT Plan Current plan remains appropriate    Co-evaluation              AM-PAC PT "6 Clicks" Mobility   Outcome Measure  Help needed turning from your back to your side while in a flat bed without using bedrails?: None Help needed moving from lying on your back to sitting on the side of a flat bed without using bedrails?: None Help needed moving to and from a bed to a chair (including a wheelchair)?: A Little Help needed standing up from a chair using your arms (e.g., wheelchair or bedside chair)?: A Little Help needed to walk in hospital room?: A Lot Help needed climbing 3-5 steps with a railing? : Total 6 Click Score: 17    End of Session Equipment Utilized During Treatment: Oxygen Activity Tolerance: Patient limited by fatigue;Patient limited by lethargy;Treatment limited secondary to medical complications (Comment) Patient left: in chair;with call bell/phone within reach Nurse Communication: Mobility status PT Visit Diagnosis: Difficulty in walking, not elsewhere classified (R26.2)     Time: 1610-9604 PT Time Calculation (min) (ACUTE ONLY): 38 min  Charges:  $Therapeutic Exercise: 23-37 mins $Therapeutic Activity: 8-22 mins                     Horald Chestnut, PT    Delford Field 08/01/2019, 1:25 PM

## 2019-08-01 NOTE — Progress Notes (Signed)
Spoke pt wife updated on pt moving to ICU and answered all questions at this time.

## 2019-08-01 NOTE — Progress Notes (Signed)
Pt spoke with wife on personal cell phone. He states there are no questions at this time and declines RN need to call wife this evening.

## 2019-08-01 NOTE — Consult Note (Signed)
NAME:  [name], MRN:  [id number], DOB:  May 19, 1976, LOS: 84 ADMISSION DATE:  07/22/2019, CONSULTATION DATE: 12/24 REFERRING MD: Dr. Raelyn Mora, CHIEF COMPLAINT: Pneumomediastinum  Brief History   [name] admitted 12/14 with COVID pneumonia causing acute hypoxemic respiratory failure.   Developed pneumomediastinum 12/23 with concern for possible bilateral small pneumothoraces on 12/24.  PCCM consulted for evaluation of pneumothoraces.  History of present illness   [name] admitted 12/14 with COVID infection with associated hypoxemic respiratory failure.  He tested positive for Covid 3 days prior to admission.  Initially he required 2 L O2.  He was treated with remdesivir, steroids, Actemra and convalescent plasma.  Chest x-ray showed bilateral infiltrates.  Progressed to requiring 10 L O2-NRB.  He developed pneumomediastinum 12/23 with concern for possible bilateral small pneumothoraces on 12/24.  Pt remains on HFNC 25L with 100% FiO2.    PCCM consulted for evaluation of pneumothoraces.  He tells me that he has -felt some discomfort in his neck over the last 2 to 3 days from the swelling and subcutaneous air.  He has mild pain in his neck but it is not severe.  He has no trouble talking, breathing or swallowing.  He says that his breathing is actually improved in the last 24 hours and he feels that he is able to keep his oxygen level up more and his breathing feels somewhat better.  He denies chest pain.  Past Medical History  GERD HTN Asthma Thrombocytopenia COVID-19 pneumonia  Significant Hospital Events   12/14 admit with hypoxemic respiratory failure in setting of COVID-19 pneumonia 12/23 noted to have pneumomediastinum on chest x-ray 12/24 PCCM consulted for evaluation   Consults:  PCCM  Procedures:    Significant Diagnostic Tests:  CT chest 12/22 >> extensive pneumomediastinum, multifocal patchy bilateral groundglass opacities  Micro Data:  BCx2 12/14 >> negative   Sputum 12/15 >> negative   Antimicrobials /COVID Rx:  Received remdesivir, steroids, Actemra and convalescent plasma  Interim history/subjective:  As above  Objective   Blood pressure 129/85, pulse (!) 108, temperature 98.6 F (37 C), temperature source Axillary, resp. rate (!) 27, weight 136.1 kg, SpO2 93 %.    FiO2 (%):  [100 %] 100 %   Intake/Output Summary (Last 24 hours) at 08/01/2019 1449 Last data filed at 08/01/2019 0600 Gross per 24 hour  Intake 770 ml  Output 750 ml  Net 20 ml   Filed Weights   07/22/19 1300  Weight: 136.1 kg    Examination:  General:  Resting comfortably in chair HENT: NCAT OP clear PULM: Crackles bases B, normal effort CV: RRR, no mgr GI: BS+, soft, nontender MSK: normal bulk and tone Neuro: awake, alert, no distress, MAEW  Chest x-ray from 1224 images personally reviewed showing small bilateral pneumothoraces, pneumomediastinum, subcutaneous air, diffuse bilateral interstitial and airspace disease   Resolved Hospital Problem list      Assessment & Plan:   Acute Hypoxemic Respiratory Failure secondary to COVID Suspect this will take weeks to recover Plan Decadron for 10-day course 6 mg daily Continue heated high flow oxygen to maintain O2 saturation greater than 85% Move to intensive care unit for closer monitoring Tolerate periods of hypoxemia, goal at rest is greater than 85% SaO2, with movement ideally above 75% Decision for intubation should be based on a change in mental status or physical evidence of ventilatory failure such as nasal flaring, accessory muscle use, paradoxical breathing Out of bed to chair as able  Incentive spirometry is important, use every hour Prone positioning while in bed    Pneumomediastinum with small bilateral pneumothoraces Currently there is no indication for chest tube placement, in fact this would likely cause more harm than good by inadvertent lung damage If he develops worsening chest pain or  shortness of breath then he needs a stat chest x-ray to see if the pneumothorax is worsening We will move to intensive care unit to have chest tube equipment at bedside in case he needs a chest tube    Best practice:  Diet: Heart healthy  Pain/Anxiety/Delirium protocol (if indicated): n/a VAP protocol (if indicated): n/a DVT prophylaxis: enoxaparin  GI prophylaxis: PPI Glucose control: SSI Mobility: As tolerated  Code Status: Full Code  Family Communication: Patient updated on plan of care, He requests that we not share the information that he is going to the ICU with his family tonight as it is Christmas Eve.  This was discussed with his bedside nurse and the ICU charge nurse. Disposition: PCU, per [facility]   Labs   CBC: Recent Labs  Lab 07/26/19 0031 07/27/19 0104 08/01/19 0300  WBC 4.8 6.0 10.3  NEUTROABS 3.5 4.7  --   HGB 13.7 14.2 15.9  HCT 43.4 45.0 49.5  MCV 85.3 85.1 82.8  PLT 136* 141* 591    Basic Metabolic Panel: Recent Labs  Lab 07/26/19 0031 07/27/19 0104 07/27/19 1708 07/29/19 0500 07/30/19 0300 07/31/19 0300  NA 142 142  --  138 138 136  K 3.9 4.3  --  4.1 4.0 4.6  CL 100 99  --  101 100 101  CO2 30 31  --  23 24 25   GLUCOSE 168* 164*  --  115* 98 87  BUN 30* 37*  --  33* 32* 31*  CREATININE 0.80 0.99  --  0.74 0.80 0.83  CALCIUM 8.6* 8.6*  --  8.8* 8.7* 8.5*  MG 2.7* 2.6* 2.4  --   --   --   PHOS 3.5 3.9  --   --   --   --    GFR: Estimated Creatinine Clearance: 151.8 mL/min (by C-G formula based on SCr of 0.83 mg/dL). Recent Labs  Lab 07/26/19 0031 07/27/19 0104 07/29/19 0500 07/30/19 0300 07/31/19 0300 08/01/19 0300  PROCALCITON  --   --  <0.10 <0.10 <0.10  --   WBC 4.8 6.0  --   --   --  10.3    Liver Function Tests: Recent Labs  Lab 07/26/19 0031 07/27/19 0104  AST 34 30  ALT 34 40  ALKPHOS 50 50  BILITOT 0.8 1.1  PROT 7.0 7.0  ALBUMIN 3.4* 3.6   No results for input(s): LIPASE, AMYLASE in the last 168 hours. No results for  input(s): AMMONIA in the last 168 hours.  ABG No results found for: PHART, PCO2ART, PO2ART, HCO3, TCO2, ACIDBASEDEF, O2SAT   Coagulation Profile: No results for input(s): INR, PROTIME in the last 168 hours.  Cardiac Enzymes: No results for input(s): CKTOTAL, CKMB, CKMBINDEX, TROPONINI in the last 168 hours.  HbA1C: Hgb A1c MFr Bld  Date/Time Value Ref Range Status  07/24/2019 04:50 AM 6.7 (H) 4.8 - 5.6 % Final    Comment:    (NOTE) Pre diabetes:          5.7%-6.4% Diabetes:              >6.4% Glycemic control for   <7.0% adults with diabetes   09/30/2017 05:36 AM 5.7 (H) 4.8 -  5.6 % Final    Comment:    (NOTE)         Prediabetes: 5.7 - 6.4         Diabetes: >6.4         Glycemic control for adults with diabetes: <7.0     CBG: Recent Labs  Lab 07/31/19 1124 07/31/19 1617 07/31/19 2133 08/01/19 0755 08/01/19 1147  GLUCAP 149* 112* 110* 101* 155*    Review of Systems: Positives in [place]   Gen: Denies fever, chills, weight change, fatigue, night sweats HEENT: Denies blurred vision, double vision, hearing loss, tinnitus, sinus congestion, rhinorrhea, sore throat, neck stiffness, dysphagia PULM: Denies shortness of breath, cough, sputum production, hemoptysis, wheezing CV: Denies chest pain, edema, orthopnea, paroxysmal nocturnal dyspnea, palpitations GI: Denies abdominal pain, nausea, vomiting, diarrhea, hematochezia, melena, constipation, change in bowel habits GU: Denies dysuria, hematuria, polyuria, oliguria, urethral discharge Endocrine: Denies hot or cold intolerance, polyuria, polyphagia or appetite change Derm: Denies rash, dry skin, scaling or peeling skin change Heme: Denies easy bruising, bleeding, bleeding gums Neuro: Denies headache, numbness, weakness, slurred speech, loss of memory or consciousness  Past Medical History  He,  has a past medical history of Asthma, GERD (gastroesophageal reflux disease), Headache, History of kidney stones, HTN  (hypertension), and Pancreatitis (2018).   Surgical History    Past Surgical History:  Procedure Laterality Date  . CYSTOSCOPY/URETEROSCOPY/HOLMIUM LASER/STENT PLACEMENT Left 04/18/2019   Procedure: LEFT URETEROSCOPY/HOLMIUM LASER/STENT PLACEMENT;  Surgeon: Ardis Hughs, MD;  Location[facility];  Service: Urology;  Laterality: Left;  . CYSTOSCOPY/URETEROSCOPY/HOLMIUM LASER/STENT PLACEMENT Left 05/02/2019   Procedure: CYSTOSCOPY/URETEROSCOPY/HOLMIUM LASER/STENT EXCHANGE;  Surgeon: Ardis Hughs, MD;  Location: [facility];  Service: Urology;  Laterality: Left;  . IRRIGATION AND DEBRIDEMENT SHOULDER Right 09/29/2017   Procedure: IRRIGATION AND DEBRIDEMENT SHOULDER;  Surgeon: Leandrew Koyanagi, MD;  Location: [place];  Service: Orthopedics;  Laterality: Right;  . [place] SURGERY  2002     Social History   reports that he has never smoked. He has never used smokeless tobacco. He reports current alcohol use. He reports that he does not use drugs.   Family History   His family history includes Asthma in his brother; Diabetes in his father and paternal grandmother; GER disease in his mother; Heart disease in his maternal grandfather; Hypertension in his father and paternal grandmother; Migraines in his mother; Pancreatic cancer in his maternal grandmother.   Allergies No Known Allergies   Home Medications  Prior to Admission medications   Medication Sig Start Date End Date Taking? Authorizing Provider  Albuterol Sulfate (PROAIR RESPICLICK) 782 (90 Base) MCG/ACT AEPB Inhale 2 puffs into the lungs every 4 (four) hours as needed (Shortness of breath). 05/28/19  Yes Valentina Shaggy, MD  budesonide-formoterol [facility]) 160-4.5 MCG/ACT inhaler Inhale 2 puffs into the lungs daily as needed (cough or wheeze). Inhale two puffs ONCE daily to prevent cough or wheeze. Rinse, gargle, and spit after use. Patient taking differently: Inhale 2 puffs into the lungs daily as needed  (cough or wheeze). Rinse, gargle and spit after use. 05/28/19  Yes Valentina Shaggy, MD  cetirizine (ZYRTEC) 10 MG tablet Take 1 tablet (10 mg total) by mouth daily. 02/06/18  Yes Kozlow, Donnamarie Poag, MD  lisinopril-hydrochlorothiazide (ZESTORETIC) 10-12.5 MG tablet Take 1 tablet by mouth daily. 04/08/19  Yes [provider]  omeprazole (PRILOSEC) 20 MG capsule Take 40 mg by mouth daily.    Yes [provider]  triamcinolone (NASACORT  ALLERGY 24HR) 55 MCG/ACT AERO nasal inhaler Place 2 sprays into the nose daily. Patient taking differently: Place 2 sprays into the nose daily as needed (allergies).  02/06/18  Yes Kozlow, Donnamarie Poag, MD     Critical care time: 30 minutes    Roselie Awkward, MD [place] [facility] Pager: [phone] Cell: [phone] If no response, call [phone]

## 2019-08-01 NOTE — Progress Notes (Signed)
Pt Belongings: Wallet sent to security, metal wallet with $20 cash, credit cards and ID  In room: Christmas ornament, predator box, pac man table side arcade game, picture frame, toiletry bag with tooth brush, deoderant, tooth paste, markers, and activity papers, nike shoes, jacket, pants, multiple pairs of underwear.

## 2019-08-01 NOTE — Progress Notes (Signed)
PROGRESS NOTE    [name]  YNW:[id number] DOB: October 25, 1975 DOA: 07/22/2019 PCP: Tamsen Roers, MD    Brief Narrative:  43 year old gentleman with history of hypertension, GERD and asthma who presented to the emergency department with worsening cough and shortness of breath with fever for about 1 week.  He was tested COVID-19 3 days prior to admission.  In the emergency room he was hypoxic with oxygen saturation 88% on room air and placed on 2 L oxygen.  Chest x-ray showed pneumonia.  Patient was admitted to hospital and subsequently placed on 10 L of nonrebreather, he was treated with remdesivir and steroids, given convalescent plasma and Actemra on 07/25/2019.  Patient continues to require high flow oxygen and has developed pneumomediastinum. 08/01/2019: Remains on high flow oxygen, with pneumomediastinum and a small/ moderate pneumothorax.   Assessment & Plan:   Principal Problem:   Acute respiratory failure with hypoxia (HCC) Active Problems:   GERD (gastroesophageal reflux disease)   Essential hypertension   Asthma   Thrombocytopenia (HCC)   Leukopenia   Acute respiratory distress syndrome (ARDS) due to COVID-19 virus (HCC)  Acute hypoxemic respiratory failure due to COVID-19 viral infection, COVID-19 pneumonia with ARDS, complicated by pneumomediastinum and bilateral small pneumothorax: Continue to monitor due to significant symptoms, as much he can do we will continue to do chest physiotherapy, incentive spirometry, deep breathing exercises, sputum induction, mucolytic's and bronchodilators. Suppress cough with scheduled cough medications and Tessalon Perls. Supplemental oxygen to keep saturations more than 85%. Covid directed therapy with , steroids, on dexamethasone remdesivir, on extended therapy, treated for 10 days.   actemra and convalescent plasma given on 07/25/2019. Appreciate pulmonology input, will need close monitor for pneumomediastinum, no intervention  possible. Will discuss with critical care team. We will need very close monitoring with serial chest x-rays and clinical examination.  Hypertension: Blood pressure stable.  Newly diagnosed type 2 diabetes: Hemoglobin A1c 6.7.  Not on treatment at home.  Blood sugars elevated with concomitant use of steroids.  Continue insulin coverage.  Blood sugars are adequate.  Patient remains critically ill.  DVT prophylaxis: Lovenox subcu Code Status: Full code.   Family Communication: None.  Patient stated that he is talking with his wife. Disposition Plan: Remains critically ill.   Consultants:   PCCM  Procedures:   None  Antimicrobials:  Anti-infectives (From admission, onward)   Start     Dose/Rate Route Frequency Ordered Stop   07/27/19 1000  remdesivir 100 mg in sodium chloride 0.9 % 100 mL IVPB     100 mg 200 mL/hr over 30 Minutes Intravenous Daily 07/26/19 1442 07/31/19 0948   07/23/19 1000  remdesivir 100 mg in sodium chloride 0.9 % 100 mL IVPB     100 mg 200 mL/hr over 30 Minutes Intravenous Daily 07/22/19 1302 07/26/19 1046   07/22/19 1400  remdesivir 200 mg in sodium chloride 0.9% 250 mL IVPB     200 mg 580 mL/hr over 30 Minutes Intravenous Once 07/22/19 1302 07/22/19 1512         Subjective: Patient seen and examined.  He was able to talk in full sentences.  He still remains on 25 L of oxygen with 100% FiO2.  Afebrile.  Objective: Vitals:   08/01/19 0318 08/01/19 0359 08/01/19 0400 08/01/19 0800  BP:   130/63 129/85  Pulse: 80  87 86  Resp: (!) 22  (!) 21 (!) 23  Temp:  97.9 F (36.6 C)  98.6 F (37 C)  TempSrc:  Oral  Axillary  SpO2: (!) 88%  (!) 89% 90%  Weight:        Intake/Output Summary (Last 24 hours) at 08/01/2019 1212 Last data filed at 08/01/2019 0600 Gross per 24 hour  Intake 770 ml  Output 750 ml  Net 20 ml   Filed Weights   07/22/19 1300  Weight: 136.1 kg    Examination:  General exam: Appears anxious.  In mild respiratory distress  and on high flow oxygen.  Sitting in chair. Respiratory system: Poor bilateral air entry.  Conducted airway sounds.  Palpable crepitus mostly along the anterior chest wall.   Cardiovascular system: S1 & S2 heard, RRR.   Gastrointestinal system: Abdomen is nondistended, soft and nontender. Central nervous system: Alert and oriented. No focal neurological deficits. Extremities: Symmetric 5 x 5 power. Skin: No rashes, lesions or ulcers Psychiatry: Judgement and insight appear normal. Mood & affect anxious.    Data Reviewed: I have personally reviewed following labs and imaging studies  CBC: Recent Labs  Lab 07/26/19 0031 07/27/19 0104 08/01/19 0300  WBC 4.8 6.0 10.3  NEUTROABS 3.5 4.7  --   HGB 13.7 14.2 15.9  HCT 43.4 45.0 49.5  MCV 85.3 85.1 82.8  PLT 136* 141* 983   Basic Metabolic Panel: Recent Labs  Lab 07/26/19 0031 07/27/19 0104 07/27/19 1708 07/29/19 0500 07/30/19 0300 07/31/19 0300  NA 142 142  --  138 138 136  K 3.9 4.3  --  4.1 4.0 4.6  CL 100 99  --  101 100 101  CO2 30 31  --  23 24 25   GLUCOSE 168* 164*  --  115* 98 87  BUN 30* 37*  --  33* 32* 31*  CREATININE 0.80 0.99  --  0.74 0.80 0.83  CALCIUM 8.6* 8.6*  --  8.8* 8.7* 8.5*  MG 2.7* 2.6* 2.4  --   --   --   PHOS 3.5 3.9  --   --   --   --    GFR: Estimated Creatinine Clearance: 151.8 mL/min (by C-G formula based on SCr of 0.83 mg/dL). Liver Function Tests: Recent Labs  Lab 07/26/19 0031 07/27/19 0104  AST 34 30  ALT 34 40  ALKPHOS 50 50  BILITOT 0.8 1.1  PROT 7.0 7.0  ALBUMIN 3.4* 3.6   No results for input(s): LIPASE, AMYLASE in the last 168 hours. No results for input(s): AMMONIA in the last 168 hours. Coagulation Profile: No results for input(s): INR, PROTIME in the last 168 hours. Cardiac Enzymes: No results for input(s): CKTOTAL, CKMB, CKMBINDEX, TROPONINI in the last 168 hours. BNP (last 3 results) No results for input(s): PROBNP in the last 8760 hours. HbA1C: No results for  input(s): HGBA1C in the last 72 hours. CBG: Recent Labs  Lab 07/31/19 1124 07/31/19 1617 07/31/19 2133 08/01/19 0755 08/01/19 1147  GLUCAP 149* 112* 110* 101* 155*   Lipid Profile: No results for input(s): CHOL, HDL, LDLCALC, TRIG, CHOLHDL, LDLDIRECT in the last 72 hours. Thyroid Function Tests: No results for input(s): TSH, T4TOTAL, FREET4, T3FREE, THYROIDAB in the last 72 hours. Anemia Panel: No results for input(s): VITAMINB12, FOLATE, FERRITIN, TIBC, IRON, RETICCTPCT in the last 72 hours. Sepsis Labs: Recent Labs  Lab 07/29/19 0500 07/30/19 0300 07/31/19 0300  PROCALCITON <0.10 <0.10 <0.10    Recent Results (from the past 240 hour(s))  Culture, sputum-assessment     Status: None   Collection Time: 07/23/19 12:17 AM   Specimen: Sputum  Result Value Ref  Range Status   Specimen Description SPUTUM  Final   Special Requests NONE  Final   Sputum evaluation   Final    THIS SPECIMEN IS ACCEPTABLE FOR SPUTUM CULTURE Performed at [facility], [address] [place], [place] [address]    Report Status 07/23/2019 FINAL  Final  Culture, respiratory     Status: None   Collection Time: 07/23/19 12:17 AM   Specimen: SPU  Result Value Ref Range Status   Specimen Description SPUTUM  Final   Special Requests NONE Reflexed from [id number]  Final   Gram Stain   Final    FEW WBC PRESENT, PREDOMINANTLY PMN ABUNDANT GRAM NEGATIVE COCCOBACILLI RARE GRAM POSITIVE COCCI    Culture   Final    Consistent with normal respiratory flora. Performed at [facility], [place] [address] [place], [place] [address]    Report Status 07/25/2019 FINAL  Final         Radiology Studies: CT CHEST WO CONTRAST  Result Date: 07/30/2019 CLINICAL DATA:  43 year old male with dyspnea on exertion. EXAM: CT CHEST WITHOUT CONTRAST TECHNIQUE: Multidetector CT imaging of the chest was performed following the standard protocol without IV contrast. COMPARISON:  Chest radiograph dated 07/28/2019 and  07/22/2019. FINDINGS: Evaluation of this exam is limited in the absence of intravenous contrast. Evaluation is also limited due to respiratory motion artifact. Cardiovascular: There is no cardiomegaly or pericardial effusion. The thoracic aorta and central pulmonary arteries are grossly unremarkable on this noncontrast CT. Mediastinum/Nodes: No hilar or mediastinal adenopathy. The esophagus and the thyroid gland are grossly unremarkable as visualized. There is extensive pneumomediastinum extending superiorly and dissecting through the fat planes of the neck. No mediastinal fluid collection. Lungs/Pleura: Extensive bilateral patchy ground-glass opacities with predominant subpleural distribution most consistent with multifocal pneumonia, possibly atypical or viral in etiology including COVID-19. Clinical correlation is recommended. No pleural effusion or pneumothorax. The central airways are patent. Upper Abdomen: Fatty infiltration of the liver. The visualized upper abdomen is otherwise unremarkable. Musculoskeletal: No chest wall mass or suspicious bone lesions identified. IMPRESSION: 1. Extensive pneumomediastinum. 2. Multifocal pneumonia, possibly atypical or viral in etiology. Clinical correlation is recommended. These results were called by telephone at the time of interpretation on 07/30/2019 at 4:51 pm to provider [facility] , who verbally acknowledged these results. Electronically Signed   By: Anner Crete M.D.   On: 07/30/2019 17:10   DG CHEST PORT 1 VIEW  Addendum Date: 08/01/2019   ADDENDUM REPORT: 08/01/2019 10:30 ADDENDUM: Study discussed by telephone with Dr. Sloan Leiter on 08/01/2019 at 1015 hours. He had seen the report of this case in a timely fashion, but was working in isolation without access to a telephone. Electronically Signed   By: Genevie Ann M.D.   On: 08/01/2019 10:30   Result Date: 08/01/2019 CLINICAL DATA:  43 year old male COVID-19.  Crepitus. EXAM: PORTABLE CHEST 1 VIEW  COMPARISON:  07/31/2019 portable chest and earlier. FINDINGS: Portable AP semi upright view at 0637 hours. New since the CT on 07/30/2019 widespread chest wall and lower neck subcutaneous gas, increased from the portable chest yesterday, and could be extension from the pneumomediastinum seen on CT. However, there is evidence of new bilateral pneumothoraces since yesterday. Pleural edges visible over post posterior 3rd ribs. Stable cardiac size and mediastinal contours. Patchy and confluent widespread bilateral pulmonary opacity is stable. No pleural effusion evident. Paucity of bowel gas. Visualized tracheal air column is within normal limits. Stable visualized osseous structures. IMPRESSION: 1.  New bilateral pneumothoraces, small or at most moderate given semi-upright portable technique. 2. Widespread chest wall and lower neck subcutaneous gas has increased since the portable film yesterday. 3. Otherwise stable bilateral COVID-19.  Pneumonia Electronically Signed: By: Genevie Ann M.D. On: 08/01/2019 08:44   DG CHEST PORT 1 VIEW  Result Date: 07/31/2019 CLINICAL DATA:  Shortness of breath EXAM: PORTABLE CHEST 1 VIEW COMPARISON:  07/28/2019 FINDINGS: Cardiomegaly. Diffuse bilateral airspace disease slightly worsening since prior study. No significant effusions. No acute bony abnormality. IMPRESSION: Diffuse severe bilateral airspace disease, slightly worsening since prior study. Electronically Signed   By: Rolm Baptise M.D.   On: 07/31/2019 08:45        Scheduled Meds: . albuterol  2 puff Inhalation Q6H  . vitamin C  500 mg Oral Daily  . benzonatate  100 mg Oral TID  . dexamethasone (DECADRON) injection  6 mg Intravenous Q24H  . enoxaparin (LOVENOX) injection  70 mg Subcutaneous Daily  . insulin aspart  0-15 Units Subcutaneous TID WC  . insulin aspart  0-5 Units Subcutaneous QHS  . insulin detemir  20 Units Subcutaneous BID  . mouth rinse  15 mL Mouth Rinse BID  . metoprolol tartrate  75 mg Oral BID   . pantoprazole  40 mg Oral Daily  . zinc sulfate  220 mg Oral Daily   Continuous Infusions:   LOS: 10 days    Time spent: 30 minutes    Barb Merino, MD [facility] Pager [phone]

## 2019-08-02 ENCOUNTER — Inpatient Hospital Stay (HOSPITAL_COMMUNITY): Payer: Managed Care, Other (non HMO)

## 2019-08-02 DIAGNOSIS — I10 Essential (primary) hypertension: Secondary | ICD-10-CM

## 2019-08-02 LAB — GLUCOSE, CAPILLARY
Glucose-Capillary: 124 mg/dL — ABNORMAL HIGH (ref 70–99)
Glucose-Capillary: 120 mg/dL — ABNORMAL HIGH (ref 70–99)
Glucose-Capillary: 109 mg/dL — ABNORMAL HIGH (ref 70–99)

## 2019-08-02 LAB — CBC WITH DIFFERENTIAL/PLATELET
Abs Immature Granulocytes: 0.72 10*3/uL — ABNORMAL HIGH (ref 0.00–0.07)
Basophils Absolute: 0.1 10*3/uL (ref 0.0–0.1)
Basophils Relative: 1 %
Eosinophils Absolute: 0.1 10*3/uL (ref 0.0–0.5)
Eosinophils Relative: 1 %
HCT: 50.4 % (ref 39.0–52.0)
Hemoglobin: 16.4 g/dL (ref 13.0–17.0)
Immature Granulocytes: 5 %
Lymphocytes Relative: 13 %
Lymphs Abs: 1.9 10*3/uL (ref 0.7–4.0)
MCH: 27.4 pg (ref 26.0–34.0)
MCHC: 32.5 g/dL (ref 30.0–36.0)
MCV: 84.3 fL (ref 80.0–100.0)
Monocytes Absolute: 0.7 10*3/uL (ref 0.1–1.0)
Monocytes Relative: 5 %
Neutro Abs: 11.7 10*3/uL — ABNORMAL HIGH (ref 1.7–7.7)
Neutrophils Relative %: 75 %
Platelets: 185 10*3/uL (ref 150–400)
RBC: 5.98 MIL/uL — ABNORMAL HIGH (ref 4.22–5.81)
RDW: 13.2 % (ref 11.5–15.5)
WBC: 15.2 10*3/uL — ABNORMAL HIGH (ref 4.0–10.5)
nRBC: 0 % (ref 0.0–0.2)

## 2019-08-02 LAB — COMPREHENSIVE METABOLIC PANEL
ALT: 25 U/L (ref 0–44)
AST: 31 U/L (ref 15–41)
Albumin: 3.2 g/dL — ABNORMAL LOW (ref 3.5–5.0)
Alkaline Phosphatase: 75 U/L (ref 38–126)
Anion gap: 13 (ref 5–15)
BUN: 35 mg/dL — ABNORMAL HIGH (ref 6–20)
CO2: 24 mmol/L (ref 22–32)
Calcium: 8.6 mg/dL — ABNORMAL LOW (ref 8.9–10.3)
Chloride: 100 mmol/L (ref 98–111)
Creatinine, Ser: 0.91 mg/dL (ref 0.61–1.24)
GFR calc Af Amer: >60 mL/min (ref >60)
GFR calc non Af Amer: >60 mL/min (ref >60)
Glucose, Bld: 84 mg/dL (ref 70–99)
Potassium: 4 mmol/L (ref 3.5–5.1)
Sodium: 137 mmol/L (ref 135–145)
Total Bilirubin: 0.9 mg/dL (ref 0.3–1.2)
Total Protein: 6.2 g/dL — ABNORMAL LOW (ref 6.5–8.1)

## 2019-08-02 LAB — MAGNESIUM: Magnesium: 2.1 mg/dL (ref 1.7–2.4)

## 2019-08-02 LAB — MRSA PCR SCREENING: MRSA by PCR: NEGATIVE

## 2019-08-02 LAB — PHOSPHORUS: Phosphorus: 4.3 mg/dL (ref 2.5–4.6)

## 2019-08-02 MED ORDER — SALINE SPRAY 0.65 % NA SOLN
1.0000 | NASAL | Status: DC | PRN
  Filled 2019-08-02: qty 44

## 2019-08-02 MED ORDER — MORPHINE SULFATE (PF) 2 MG/ML IV SOLN
2.0000 mg | Freq: Four times a day (QID) | INTRAVENOUS | Status: DC | PRN
  Administered 2019-08-02 – 2019-08-04 (×4): 2 mg via INTRAVENOUS
  Filled 2019-08-02 (×3): qty 1

## 2019-08-02 NOTE — Plan of Care (Signed)
  Problem: Education: Goal: Knowledge of risk factors and measures for prevention of condition will improve Outcome: Progressing   Problem: Coping: Goal: Psychosocial and spiritual needs will be supported Outcome: Progressing   Problem: Respiratory: Goal: Will maintain a patent airway Outcome: Progressing Goal: Complications related to the disease process, condition or treatment will be avoided or minimized Outcome: Progressing   

## 2019-08-02 NOTE — Progress Notes (Signed)
Patient has orders for ICU; patient transferred with all belongings with him; stable at time of transfer; report called to [facility] using [id number].

## 2019-08-02 NOTE — Progress Notes (Signed)
NAME:  [name], MRN:  [id number], DOB:  23-Aug-1975, LOS: 53 ADMISSION DATE:  07/22/2019, CONSULTATION DATE:  12/24 REFERRING MD:  Sloan Leiter, CHIEF COMPLAINT:  Dyspnea   Brief History   [name] admitted 12/14 with COVID pneumonia causing acute hypoxemic respiratory failure.   Developed pneumomediastinum 12/23 with concern for possible bilateral small pneumothoraces on 12/24.  PCCM consulted for evaluation of pneumothoraces  Past Medical History  GERD HTN Asthma  Significant Hospital Events   12/14 admit with hypoxemic respiratory failure in setting of COVID-19 pneumonia 12/23 noted to have pneumomediastinum on chest x-ray 12/24 PCCM consulted for evaluation   Consults:  PCCM  Procedures:    Significant Diagnostic Tests:  CT chest 12/22 >> extensive pneumomediastinum, multifocal patchy bilateral groundglass opacitiesCT chest 12/22 >> extensive pneumomediastinum, multifocal patchy bilateral groundglass opacities  Micro Data:  BCx2 12/14 >> negative  Sputum 12/15 >> negative   Antimicrobials:  Received remdesivir, steroids, Actemra and convalescent plasma   Interim history/subjective:   Feels about the same Breathing unchanged Still has some mild discomfort in his neck  Objective   Blood pressure 127/74, pulse 88, temperature 97.7 F (36.5 C), temperature source Axillary, resp. rate (!) 27, weight 136.1 kg, SpO2 (!) 89 %.    FiO2 (%):  [100 %] 100 %   Intake/Output Summary (Last 24 hours) at 08/02/2019 6213 Last data filed at 08/02/2019 0000 Gross per 24 hour  Intake 740 ml  Output 825 ml  Net -85 ml   Filed Weights   07/22/19 1300  Weight: 136.1 kg    Examination:  General:  Resting comfortably in bed HENT: NCAT OP clear PULM: crackles, wheezing B, normal effort CV: RRR, no mgr GI: BS+, soft, nontender MSK: normal bulk and tone Neuro: awake, alert, no distress, MAEW   Resolved Hospital Problem list     Assessment & Plan:  Acute Hypoxemic  Respiratory Failure secondary to COVID Suspect this will take weeks to recover Plan decadron for 10 day course 57m daily Tolerate periods of hypoxemia, goal at rest is greater than 85% SaO2, with movement ideally above 75% Decision for intubation should be based on a change in mental status or physical evidence of ventilatory failure such as nasal flaring, accessory muscle use, paradoxical breathing Out of bed to chair as able Incentive spirometry is important, use every hour Prone positioning while in bed     Pneumomediastinum with small bilateral pneumothoraces, no change in oxygenation or work of breathing with this development Daily CXR Hold off on chest tube placement unless clinical condition worsens     Best practice:   Per TRH  Would keep in ICU again overnight, if stable then out tomorrow  Labs   CBC: Recent Labs  Lab 07/27/19 0104 08/01/19 0300  WBC 6.0 10.3  NEUTROABS 4.7  --   HGB 14.2 15.9  HCT 45.0 49.5  MCV 85.1 82.8  PLT 141* 1086   Basic Metabolic Panel: Recent Labs  Lab 07/27/19 0104 07/27/19 1708 07/29/19 0500 07/30/19 0300 07/31/19 0300  NA 142  --  138 138 136  K 4.3  --  4.1 4.0 4.6  CL 99  --  101 100 101  CO2 31  --  23 24 25   GLUCOSE 164*  --  115* 98 87  BUN 37*  --  33* 32* 31*  CREATININE 0.99  --  0.74 0.80 0.83  CALCIUM 8.6*  --  8.8* 8.7* 8.5*  MG 2.6* 2.4  --   --   --  PHOS 3.9  --   --   --   --    GFR: Estimated Creatinine Clearance: 151.8 mL/min (by C-G formula based on SCr of 0.83 mg/dL). Recent Labs  Lab 07/27/19 0104 07/29/19 0500 07/30/19 0300 07/31/19 0300 08/01/19 0300  PROCALCITON  --  <0.10 <0.10 <0.10  --   WBC 6.0  --   --   --  10.3    Liver Function Tests: Recent Labs  Lab 07/27/19 0104  AST 30  ALT 40  ALKPHOS 50  BILITOT 1.1  PROT 7.0  ALBUMIN 3.6   No results for input(s): LIPASE, AMYLASE in the last 168 hours. No results for input(s): AMMONIA in the last 168 hours.  ABG No results  found for: PHART, PCO2ART, PO2ART, HCO3, TCO2, ACIDBASEDEF, O2SAT   Coagulation Profile: No results for input(s): INR, PROTIME in the last 168 hours.  Cardiac Enzymes: No results for input(s): CKTOTAL, CKMB, CKMBINDEX, TROPONINI in the last 168 hours.  HbA1C: Hgb A1c MFr Bld  Date/Time Value Ref Range Status  07/24/2019 04:50 AM 6.7 (H) 4.8 - 5.6 % Final    Comment:    (NOTE) Pre diabetes:          5.7%-6.4% Diabetes:              >6.4% Glycemic control for   <7.0% adults with diabetes   09/30/2017 05:36 AM 5.7 (H) 4.8 - 5.6 % Final    Comment:    (NOTE)         Prediabetes: 5.7 - 6.4         Diabetes: >6.4         Glycemic control for adults with diabetes: <7.0     CBG: Recent Labs  Lab 07/31/19 2133 08/01/19 0755 08/01/19 1147 08/01/19 1635 08/01/19 2141  GLUCAP 110* 101* 155* 112* 113*     Critical care time: n/a     Roselie Awkward, MD [place] Pager: [phone] Cell: [phone] If no response, call [phone]

## 2019-08-02 NOTE — Progress Notes (Addendum)
At apx 2100, Pt dislodged NRB mask  while attempting to reposition when self proning. While readjusting mask, sats dropped to mid 60s w good waveform. RT came to bedside to assist. Fi02 increased. Sats increased to 70s. Notified Dr. Vanita Ingles who advised pt to unprone with goal sats >88%. Pt able to turn self supine and HOB at 90 degrees. Pt having no mentation changes. Intermittently complaints of pain when coughing. PRN medication given per emar. Pt continues to mentate well. Pt was self proned for apx 3 hours before desaturating.

## 2019-08-02 NOTE — Progress Notes (Signed)
PROGRESS NOTE    [name]  KWI:[id number] DOB: 1975/10/15 DOA: 07/22/2019 PCP: Tamsen Roers, MD    Brief Narrative:  43 year old gentleman with history of hypertension, GERD and asthma who presented to the emergency department with worsening cough and shortness of breath with fever for about 1 week.  He was tested COVID-19, 3 days prior to admission.  In the emergency room he was hypoxic with oxygen saturation 88% on room air and placed on 2 L oxygen.  Chest x-ray showed pneumonia.  Patient was admitted to hospital and subsequently placed on 10 L of nonrebreather, he was treated with remdesivir and steroids, given convalescent plasma and Actemra on 07/25/2019.  Patient continues to require high flow oxygen and has developed pneumomediastinum and small bilateral pneumothoraces. 08/01/2019: Still remains on high flow oxygen with pneumomediastinum and pneumothoraces.  Transferred to ICU.  Assessment & Plan:   Principal Problem:   Acute respiratory failure with hypoxia (HCC) Active Problems:   GERD (gastroesophageal reflux disease)   Essential hypertension   Asthma   Thrombocytopenia (HCC)   Leukopenia   Acute respiratory distress syndrome (ARDS) due to COVID-19 virus (HCC)  Acute hypoxemic respiratory failure due to COVID-19 viral infection, COVID-19 pneumonia with ARDS, complicated by pneumomediastinum and bilateral small pneumothorax: He will continue as much as he can do with chest physiotherapy, incentive spirometry, deep breathing exercises, sputum induction, mucolytic's and bronchodilators. Suppress cough with scheduled cough medications and Tessalon Perls. Supplemental oxygen to keep saturations more than 85% at rest.  More than 75% on ambulation. Covid directed therapy with , steroids, on dexamethasone, continue remdesivir, on extended therapy, treated for 10 days.   actemra and convalescent plasma given on 07/25/2019. Continue monitoring in intensive care unit.  Daily  chest x-rays and clinical examinations. Mechanical ventilation and chest tube placement for any worsening mental status/oxygen requirements.  Hypertension: Blood pressure are stable.  Newly diagnosed type 2 diabetes: Hemoglobin A1c 6.7.  Not on treatment at home.  Blood sugars elevated with concomitant use of steroids.  Continue insulin coverage.  Blood sugars are adequate.  Patient remains critically ill.  DVT prophylaxis: Lovenox subcu Code Status: Full code.   Family Communication: None.  Will update patient's wife. Disposition Plan: Remains critically ill.   Consultants:   PCCM  Procedures:   None  Antimicrobials:  Anti-infectives (From admission, onward)   Start     Dose/Rate Route Frequency Ordered Stop   07/27/19 1000  remdesivir 100 mg in sodium chloride 0.9 % 100 mL IVPB     100 mg 200 mL/hr over 30 Minutes Intravenous Daily 07/26/19 1442 07/31/19 0948   07/23/19 1000  remdesivir 100 mg in sodium chloride 0.9 % 100 mL IVPB     100 mg 200 mL/hr over 30 Minutes Intravenous Daily 07/22/19 1302 07/26/19 1046   07/22/19 1400  remdesivir 200 mg in sodium chloride 0.9% 250 mL IVPB     200 mg 580 mL/hr over 30 Minutes Intravenous Once 07/22/19 1302 07/22/19 1512         Subjective: Patient seen and examined.  Remains on high flow oxygen overnight.  He was worn out today after getting out of the bed to chair.  Complains of lessening crackles on his left ear.  Afebrile.  Objective: Vitals:   08/02/19 0922 08/02/19 1000 08/02/19 1100 08/02/19 1200  BP: 101/77 115/82 127/79 115/76  Pulse: (!) 104 99 80 78  Resp:  (!) 24 (!) 29 (!) 26  Temp:  TempSrc:      SpO2:  97% (!) 87% 93%  Weight:        Intake/Output Summary (Last 24 hours) at 08/02/2019 1332 Last data filed at 08/02/2019 1000 Gross per 24 hour  Intake 540 ml  Output 375 ml  Net 165 ml   Filed Weights   07/22/19 1300  Weight: 136.1 kg    Examination:  General exam: Appears anxious.  In  moderate respiratory distress.  Was lying in bed. Respiratory system: Poor bilateral air entry.  Conducted airway sounds.  Palpable crepitus mostly along the anterior chest wall and neck. Cardiovascular system: S1 & S2 heard, RRR.   Gastrointestinal system: Abdomen is nondistended, soft and nontender. Central nervous system: Alert and oriented. No focal neurological deficits. Extremities: Symmetric 5 x 5 power. Skin: No rashes, lesions or ulcers Psychiatry: Judgement and insight appear normal. Mood & affect anxious.    Data Reviewed: I have personally reviewed following labs and imaging studies  CBC: Recent Labs  Lab 07/27/19 0104 08/01/19 0300 08/02/19 0655  WBC 6.0 10.3 15.2*  NEUTROABS 4.7  --  11.7*  HGB 14.2 15.9 16.4  HCT 45.0 49.5 50.4  MCV 85.1 82.8 84.3  PLT 141* 186 676   Basic Metabolic Panel: Recent Labs  Lab 07/27/19 0104 07/27/19 1708 07/29/19 0500 07/30/19 0300 07/31/19 0300 08/02/19 0655  NA 142  --  138 138 136 137  K 4.3  --  4.1 4.0 4.6 4.0  CL 99  --  101 100 101 100  CO2 31  --  23 24 25 24   GLUCOSE 164*  --  115* 98 87 84  BUN 37*  --  33* 32* 31* 35*  CREATININE 0.99  --  0.74 0.80 0.83 0.91  CALCIUM 8.6*  --  8.8* 8.7* 8.5* 8.6*  MG 2.6* 2.4  --   --   --  2.1  PHOS 3.9  --   --   --   --  4.3   GFR: Estimated Creatinine Clearance: 138.5 mL/min (by C-G formula based on SCr of 0.91 mg/dL). Liver Function Tests: Recent Labs  Lab 07/27/19 0104 08/02/19 0655  AST 30 31  ALT 40 25  ALKPHOS 50 75  BILITOT 1.1 0.9  PROT 7.0 6.2*  ALBUMIN 3.6 3.2*   No results for input(s): LIPASE, AMYLASE in the last 168 hours. No results for input(s): AMMONIA in the last 168 hours. Coagulation Profile: No results for input(s): INR, PROTIME in the last 168 hours. Cardiac Enzymes: No results for input(s): CKTOTAL, CKMB, CKMBINDEX, TROPONINI in the last 168 hours. BNP (last 3 results) No results for input(s): PROBNP in the last 8760 hours. HbA1C: No  results for input(s): HGBA1C in the last 72 hours. CBG: Recent Labs  Lab 08/01/19 1147 08/01/19 1635 08/01/19 2141 08/02/19 0930 08/02/19 1216  GLUCAP 155* 112* 113* 124* 120*   Lipid Profile: No results for input(s): CHOL, HDL, LDLCALC, TRIG, CHOLHDL, LDLDIRECT in the last 72 hours. Thyroid Function Tests: No results for input(s): TSH, T4TOTAL, FREET4, T3FREE, THYROIDAB in the last 72 hours. Anemia Panel: No results for input(s): VITAMINB12, FOLATE, FERRITIN, TIBC, IRON, RETICCTPCT in the last 72 hours. Sepsis Labs: Recent Labs  Lab 07/29/19 0500 07/30/19 0300 07/31/19 0300  PROCALCITON <0.10 <0.10 <0.10    Recent Results (from the past 240 hour(s))  MRSA PCR Screening     Status: None   Collection Time: 08/02/19  1:00 AM   Specimen: Nasal Mucosa; Nasopharyngeal  Result Value  Ref Range Status   MRSA by PCR NEGATIVE NEGATIVE Final    Comment:        The GeneXpert MRSA Assay (FDA approved for NASAL specimens only), is one component of a comprehensive MRSA colonization surveillance program. It is not intended to diagnose MRSA infection nor to guide or monitor treatment for MRSA infections. Performed at [facility], [place] [address] [place], [place] [address]          Radiology Studies: DG CHEST PORT 1 VIEW  Result Date: 08/02/2019 CLINICAL DATA:  Bilateral pneumothoraces. EXAM: PORTABLE CHEST 1 VIEW COMPARISON:  08/01/2019 FINDINGS: Examination demonstrates small bilateral pneumothoraces without significant change. Moderate bilateral airspace opacification without significant change. Cardiomediastinal silhouette is within normal. Moderate subcutaneous emphysema over the neck and chest unchanged. IMPRESSION: 1. Persistent moderate bilateral multifocal airspace process likely multifocal pneumonia. 2. Stable small bilateral pneumothoraces. Subcutaneous emphysema over the neck and chest unchanged. Electronically Signed   By: Marin Olp M.D.   On:  08/02/2019 09:26   DG CHEST PORT 1 VIEW  Addendum Date: 08/01/2019   ADDENDUM REPORT: 08/01/2019 10:30 ADDENDUM: Study discussed by telephone with Dr. Sloan Leiter on 08/01/2019 at 1015 hours. He had seen the report of this case in a timely fashion, but was working in isolation without access to a telephone. Electronically Signed   By: Genevie Ann M.D.   On: 08/01/2019 10:30   Result Date: 08/01/2019 CLINICAL DATA:  43 year old male COVID-19.  Crepitus. EXAM: PORTABLE CHEST 1 VIEW COMPARISON:  07/31/2019 portable chest and earlier. FINDINGS: Portable AP semi upright view at 0637 hours. New since the CT on 07/30/2019 widespread chest wall and lower neck subcutaneous gas, increased from the portable chest yesterday, and could be extension from the pneumomediastinum seen on CT. However, there is evidence of new bilateral pneumothoraces since yesterday. Pleural edges visible over post posterior 3rd ribs. Stable cardiac size and mediastinal contours. Patchy and confluent widespread bilateral pulmonary opacity is stable. No pleural effusion evident. Paucity of bowel gas. Visualized tracheal air column is within normal limits. Stable visualized osseous structures. IMPRESSION: 1. New bilateral pneumothoraces, small or at most moderate given semi-upright portable technique. 2. Widespread chest wall and lower neck subcutaneous gas has increased since the portable film yesterday. 3. Otherwise stable bilateral COVID-19.  Pneumonia Electronically Signed: By: Genevie Ann M.D. On: 08/01/2019 08:44        Scheduled Meds: . albuterol  2 puff Inhalation Q6H  . vitamin C  500 mg Oral Daily  . benzonatate  100 mg Oral TID  . Chlorhexidine Gluconate Cloth  6 each Topical Daily  . dexamethasone (DECADRON) injection  6 mg Intravenous Q24H  . enoxaparin (LOVENOX) injection  70 mg Subcutaneous Daily  . insulin aspart  0-15 Units Subcutaneous TID WC  . insulin aspart  0-5 Units Subcutaneous QHS  . insulin detemir  20 Units  Subcutaneous BID  . mouth rinse  15 mL Mouth Rinse BID  . metoprolol tartrate  75 mg Oral BID  . pantoprazole  40 mg Oral Daily  . zinc sulfate  220 mg Oral Daily   Continuous Infusions:   LOS: 11 days    Time spent: 30 minutes    Barb Merino, MD [facility] Pager [phone]

## 2019-08-02 NOTE — Progress Notes (Signed)
Patient face time with family for update.

## 2019-08-03 ENCOUNTER — Inpatient Hospital Stay (HOSPITAL_COMMUNITY): Payer: Managed Care, Other (non HMO)

## 2019-08-03 ENCOUNTER — Encounter (HOSPITAL_COMMUNITY)
Admission: EM | Disposition: A | Discharge status: Rehabilitation Facility | Payer: Self-pay | Source: Home / Self Care | Attending: Cardiothoracic Surgery

## 2019-08-03 ENCOUNTER — Inpatient Hospital Stay (HOSPITAL_COMMUNITY): Payer: Managed Care, Other (non HMO) | Admitting: Certified Registered Nurse Anesthetist

## 2019-08-03 DIAGNOSIS — U071 COVID-19: Secondary | ICD-10-CM

## 2019-08-03 DIAGNOSIS — Z9911 Dependence on respirator [ventilator] status: Secondary | ICD-10-CM

## 2019-08-03 DIAGNOSIS — R239 Unspecified skin changes: Secondary | ICD-10-CM

## 2019-08-03 DIAGNOSIS — J45998 Other asthma: Secondary | ICD-10-CM

## 2019-08-03 DIAGNOSIS — J9602 Acute respiratory failure with hypercapnia: Secondary | ICD-10-CM

## 2019-08-03 DIAGNOSIS — J9601 Acute respiratory failure with hypoxia: Secondary | ICD-10-CM

## 2019-08-03 DIAGNOSIS — R0603 Acute respiratory distress: Secondary | ICD-10-CM

## 2019-08-03 DIAGNOSIS — J1289 Other viral pneumonia: Secondary | ICD-10-CM

## 2019-08-03 HISTORY — PX: ECMO CANNULATION: CATH118321

## 2019-08-03 LAB — POCT I-STAT 7, (LYTES, BLD GAS, ICA,H+H)
Acid-base deficit: 3 mmol/L — ABNORMAL HIGH (ref 0.0–2.0)
Acid-base deficit: 3 mmol/L — ABNORMAL HIGH (ref 0.0–2.0)
Acid-base deficit: 2 mmol/L (ref 0.0–2.0)
Acid-base deficit: 2 mmol/L (ref 0.0–2.0)
Bicarbonate: 28.8 mmol/L — ABNORMAL HIGH (ref 20.0–28.0)
Bicarbonate: 28.9 mmol/L — ABNORMAL HIGH (ref 20.0–28.0)
Bicarbonate: 24.9 mmol/L (ref 20.0–28.0)
Bicarbonate: 22.5 mmol/L (ref 20.0–28.0)
Calcium, Ion: 1.28 mmol/L (ref 1.15–1.40)
Calcium, Ion: 1.24 mmol/L (ref 1.15–1.40)
Calcium, Ion: 1.1 mmol/L — ABNORMAL LOW (ref 1.15–1.40)
Calcium, Ion: 1.09 mmol/L — ABNORMAL LOW (ref 1.15–1.40)
HCT: 53 % — ABNORMAL HIGH (ref 39.0–52.0)
HCT: 53 % — ABNORMAL HIGH (ref 39.0–52.0)
HCT: 42 % (ref 39.0–52.0)
HCT: 40 % (ref 39.0–52.0)
Hemoglobin: 18 g/dL — ABNORMAL HIGH (ref 13.0–17.0)
Hemoglobin: 18 g/dL — ABNORMAL HIGH (ref 13.0–17.0)
Hemoglobin: 14.3 g/dL (ref 13.0–17.0)
Hemoglobin: 13.6 g/dL (ref 13.0–17.0)
O2 Saturation: 75 %
O2 Saturation: 74 %
O2 Saturation: 100 %
O2 Saturation: 100 %
Patient temperature: 37.5
Potassium: 4.7 mmol/L (ref 3.5–5.1)
Potassium: 5.7 mmol/L — ABNORMAL HIGH (ref 3.5–5.1)
Potassium: 5.1 mmol/L (ref 3.5–5.1)
Potassium: 4.9 mmol/L (ref 3.5–5.1)
Sodium: 137 mmol/L (ref 135–145)
Sodium: 135 mmol/L (ref 135–145)
Sodium: 135 mmol/L (ref 135–145)
Sodium: 138 mmol/L (ref 135–145)
TCO2: 31 mmol/L (ref 22–32)
TCO2: 31 mmol/L (ref 22–32)
TCO2: 26 mmol/L (ref 22–32)
TCO2: 24 mmol/L (ref 22–32)
pCO2 arterial: 82.4 mmHg (ref 32.0–48.0)
pCO2 arterial: 79.9 mmHg (ref 32.0–48.0)
pCO2 arterial: 48.7 mmHg — ABNORMAL HIGH (ref 32.0–48.0)
pCO2 arterial: 37.9 mmHg (ref 32.0–48.0)
pH, Arterial: 7.152 — CL (ref 7.350–7.450)
pH, Arterial: 7.167 — CL (ref 7.350–7.450)
pH, Arterial: 7.317 — ABNORMAL LOW (ref 7.350–7.450)
pH, Arterial: 7.384 (ref 7.350–7.450)
pO2, Arterial: 53 mmHg — ABNORMAL LOW (ref 83.0–108.0)
pO2, Arterial: 52 mmHg — ABNORMAL LOW (ref 83.0–108.0)
pO2, Arterial: 276 mmHg — ABNORMAL HIGH (ref 83.0–108.0)
pO2, Arterial: 255 mmHg — ABNORMAL HIGH (ref 83.0–108.0)

## 2019-08-03 LAB — COMPREHENSIVE METABOLIC PANEL
ALT: 29 U/L (ref 0–44)
AST: 42 U/L — ABNORMAL HIGH (ref 15–41)
Albumin: 3.4 g/dL — ABNORMAL LOW (ref 3.5–5.0)
Alkaline Phosphatase: 104 U/L (ref 38–126)
Anion gap: 11 (ref 5–15)
BUN: 36 mg/dL — ABNORMAL HIGH (ref 6–20)
CO2: 25 mmol/L (ref 22–32)
Calcium: 8.8 mg/dL — ABNORMAL LOW (ref 8.9–10.3)
Chloride: 102 mmol/L (ref 98–111)
Creatinine, Ser: 0.88 mg/dL (ref 0.61–1.24)
GFR calc Af Amer: >60 mL/min (ref >60)
GFR calc non Af Amer: >60 mL/min (ref >60)
Glucose, Bld: 94 mg/dL (ref 70–99)
Potassium: 4.1 mmol/L (ref 3.5–5.1)
Sodium: 138 mmol/L (ref 135–145)
Total Bilirubin: 1.2 mg/dL (ref 0.3–1.2)
Total Protein: 6.7 g/dL (ref 6.5–8.1)

## 2019-08-03 LAB — GLUCOSE, CAPILLARY
Glucose-Capillary: 125 mg/dL — ABNORMAL HIGH (ref 70–99)
Glucose-Capillary: 138 mg/dL — ABNORMAL HIGH (ref 70–99)
Glucose-Capillary: 93 mg/dL (ref 70–99)

## 2019-08-03 LAB — PROTIME-INR
INR: 2 — ABNORMAL HIGH (ref 0.8–1.2)
Prothrombin Time: 22.3 s — ABNORMAL HIGH (ref 11.4–15.2)

## 2019-08-03 LAB — HEPATIC FUNCTION PANEL
ALT: 21 U/L (ref 0–44)
AST: 27 U/L (ref 15–41)
Albumin: 2.4 g/dL — ABNORMAL LOW (ref 3.5–5.0)
Alkaline Phosphatase: 77 U/L (ref 38–126)
Bilirubin, Direct: 0.2 mg/dL (ref 0.0–0.2)
Indirect Bilirubin: 0.6 mg/dL (ref 0.3–0.9)
Total Bilirubin: 0.8 mg/dL (ref 0.3–1.2)
Total Protein: 4.6 g/dL — ABNORMAL LOW (ref 6.5–8.1)

## 2019-08-03 LAB — LACTATE DEHYDROGENASE: LDH: 659 U/L — ABNORMAL HIGH (ref 98–192)

## 2019-08-03 LAB — BASIC METABOLIC PANEL WITH GFR
Anion gap: 7 (ref 5–15)
BUN: 39 mg/dL — ABNORMAL HIGH (ref 6–20)
CO2: 21 mmol/L — ABNORMAL LOW (ref 22–32)
Calcium: 7.2 mg/dL — ABNORMAL LOW (ref 8.9–10.3)
Chloride: 110 mmol/L (ref 98–111)
Creatinine, Ser: 1.12 mg/dL (ref 0.61–1.24)
GFR calc Af Amer: >60 mL/min
GFR calc non Af Amer: >60 mL/min
Glucose, Bld: 125 mg/dL — ABNORMAL HIGH (ref 70–99)
Potassium: 5 mmol/L (ref 3.5–5.1)
Sodium: 138 mmol/L (ref 135–145)

## 2019-08-03 LAB — PHOSPHORUS
Phosphorus: 7.1 mg/dL — ABNORMAL HIGH (ref 2.5–4.6)
Phosphorus: 4.2 mg/dL (ref 2.5–4.6)

## 2019-08-03 LAB — APTT: aPTT: >200 s (ref 24–36)

## 2019-08-03 LAB — POCT ACTIVATED CLOTTING TIME: Activated Clotting Time: 246 seconds

## 2019-08-03 LAB — LACTIC ACID, PLASMA: Lactic Acid, Venous: 2.1 mmol/L (ref 0.5–1.9)

## 2019-08-03 LAB — TRIGLYCERIDES: Triglycerides: 809 mg/dL — ABNORMAL HIGH (ref <150)

## 2019-08-03 LAB — FIBRINOGEN: Fibrinogen: 153 mg/dL — ABNORMAL LOW (ref 210–475)

## 2019-08-03 LAB — PREPARE RBC (CROSSMATCH)

## 2019-08-03 LAB — MAGNESIUM: Magnesium: 2.4 mg/dL (ref 1.7–2.4)

## 2019-08-03 LAB — HEPARIN LEVEL (UNFRACTIONATED): Heparin Unfractionated: 1.32 [IU]/mL — ABNORMAL HIGH (ref 0.30–0.70)

## 2019-08-03 SURGERY — ECMO CANNULATION
Anesthesia: LOCAL

## 2019-08-03 MED ORDER — FENTANYL CITRATE (PF) 100 MCG/2ML IJ SOLN
50.0000 ug | Freq: Once | INTRAMUSCULAR | Status: AC
  Administered 2019-08-03: 50 ug via INTRAVENOUS

## 2019-08-03 MED ORDER — ALBUMIN HUMAN 5 % IV SOLN
12.5000 g | INTRAVENOUS | Status: AC | PRN
  Administered 2019-08-03 – 2019-08-05 (×4): 12.5 g via INTRAVENOUS
  Filled 2019-08-03: qty 750

## 2019-08-03 MED ORDER — FENTANYL 2500MCG IN NS 250ML (10MCG/ML) PREMIX INFUSION
0.0000 ug/h | INTRAVENOUS | Status: DC
  Administered 2019-08-03 (×2): 275 ug/h via INTRAVENOUS
  Administered 2019-08-04 – 2019-08-05 (×7): 300 ug/h via INTRAVENOUS
  Administered 2019-08-06 (×3): 400 ug/h via INTRAVENOUS
  Filled 2019-08-03 (×13): qty 250

## 2019-08-03 MED ORDER — HEPARIN (PORCINE) 25000 UT/250ML-% IV SOLN
INTRAVENOUS | Status: AC | PRN
  Administered 2019-08-03: 1300 [IU]/h via INTRAVENOUS

## 2019-08-03 MED ORDER — FENTANYL 2500MCG IN NS 250ML (10MCG/ML) PREMIX INFUSION: 50.0000 ug/h | INTRAVENOUS | Status: DC

## 2019-08-03 MED ORDER — PANTOPRAZOLE SODIUM 40 MG IV SOLR
40.0000 mg | Freq: Every day | INTRAVENOUS | Status: DC
  Administered 2019-08-03 – 2019-08-15 (×13): 40 mg via INTRAVENOUS
  Filled 2019-08-03 (×13): qty 40

## 2019-08-03 MED ORDER — VITAL HIGH PROTEIN PO LIQD
1000.0000 mL | ORAL | Status: AC
  Administered 2019-08-03 – 2019-08-05 (×3): 1000 mL

## 2019-08-03 MED ORDER — FENTANYL CITRATE (PF) 100 MCG/2ML IJ SOLN: INTRAMUSCULAR | Status: DC | PRN

## 2019-08-03 MED ORDER — ARTIFICIAL TEARS OPHTHALMIC OINT
1.0000 "application " | TOPICAL_OINTMENT | Freq: Three times a day (TID) | OPHTHALMIC | Status: DC
  Administered 2019-08-03 – 2019-08-07 (×11): 1 via OPHTHALMIC
  Filled 2019-08-03 (×4): qty 3.5

## 2019-08-03 MED ORDER — MIDAZOLAM HCL 2 MG/2ML IJ SOLN
INTRAMUSCULAR | Status: AC
  Administered 2019-08-03: 2 mg via INTRAVENOUS
  Filled 2019-08-03: qty 4

## 2019-08-03 MED ORDER — ETOMIDATE 2 MG/ML IV SOLN
INTRAVENOUS | Status: DC | PRN
  Administered 2019-08-03: 16 mg via INTRAVENOUS

## 2019-08-03 MED ORDER — FENTANYL BOLUS VIA INFUSION
50.0000 ug | INTRAVENOUS | Status: DC | PRN
  Administered 2019-08-05 (×2): 50 ug via INTRAVENOUS
  Filled 2019-08-03: qty 50

## 2019-08-03 MED ORDER — ORAL CARE MOUTH RINSE
15.0000 mL | OROMUCOSAL | Status: DC
  Administered 2019-08-03 – 2019-10-23 (×789): 15 mL via OROMUCOSAL

## 2019-08-03 MED ORDER — SODIUM CHLORIDE 0.9 % IV SOLN
INTRAVENOUS | Status: DC | PRN
  Administered 2019-08-04: 1000 mL via INTRAVENOUS
  Administered 2019-08-04: 250 mL via INTRAVENOUS
  Administered 2019-08-12 – 2019-08-28 (×7): 500 mL via INTRAVENOUS
  Administered 2019-09-25: 1000 mL via INTRAVENOUS
  Administered 2019-10-08: 250 mL via INTRAVENOUS

## 2019-08-03 MED ORDER — ROCURONIUM BROMIDE 10 MG/ML (PF) SYRINGE
PREFILLED_SYRINGE | INTRAVENOUS | Status: AC
  Filled 2019-08-03: qty 10

## 2019-08-03 MED ORDER — LIDOCAINE HCL (PF) 1 % IJ SOLN
5.0000 mL | Freq: Once | INTRAMUSCULAR | Status: AC
  Administered 2019-08-03: 5 mL via INTRADERMAL
  Filled 2019-08-03: qty 5

## 2019-08-03 MED ORDER — MIDAZOLAM BOLUS VIA INFUSION
1.0000 mg | INTRAVENOUS | Status: DC | PRN
  Administered 2019-08-04 – 2019-08-05 (×6): 2 mg via INTRAVENOUS
  Filled 2019-08-03: qty 2

## 2019-08-03 MED ORDER — PANTOPRAZOLE SODIUM 40 MG IV SOLR
40.0000 mg | Freq: Every day | INTRAVENOUS | Status: DC
  Administered 2019-08-03: 40 mg via INTRAVENOUS
  Filled 2019-08-03: qty 40

## 2019-08-03 MED ORDER — PRO-STAT SUGAR FREE PO LIQD
30.0000 mL | Freq: Two times a day (BID) | ORAL | Status: DC
  Administered 2019-08-03 – 2019-08-15 (×22): 30 mL
  Filled 2019-08-03 (×20): qty 30

## 2019-08-03 MED ORDER — VECURONIUM BROMIDE 10 MG IV SOLR
0.0000 ug/kg/min | INTRAVENOUS | Status: DC
  Filled 2019-08-03 (×2): qty 100

## 2019-08-03 MED ORDER — SUCCINYLCHOLINE CHLORIDE 20 MG/ML IJ SOLN
INTRAMUSCULAR | Status: DC | PRN
  Administered 2019-08-03: 200 mg via INTRAVENOUS

## 2019-08-03 MED ORDER — SODIUM CHLORIDE 0.9 % IV SOLN: INTRAVENOUS | Status: DC

## 2019-08-03 MED ORDER — VECURONIUM BROMIDE 10 MG IV SOLR
13.0000 mg | INTRAVENOUS | Status: DC | PRN
  Administered 2019-08-03 (×4): 13 mg via INTRAVENOUS
  Filled 2019-08-03 (×4): qty 20

## 2019-08-03 MED ORDER — ARTIFICIAL TEARS OPHTHALMIC OINT
1.0000 "application " | TOPICAL_OINTMENT | Freq: Three times a day (TID) | OPHTHALMIC | Status: DC
  Filled 2019-08-03: qty 3.5

## 2019-08-03 MED ORDER — VANCOMYCIN HCL 1250 MG/250ML IV SOLN
1250.0000 mg | Freq: Two times a day (BID) | INTRAVENOUS | Status: DC
  Administered 2019-08-04: 1250 mg via INTRAVENOUS
  Filled 2019-08-03: qty 250

## 2019-08-03 MED ORDER — POTASSIUM PHOSPHATES 15 MMOLE/5ML IV SOLN: 20.0000 mmol | Freq: Once | INTRAVENOUS | Status: DC

## 2019-08-03 MED ORDER — FENTANYL 2500MCG IN NS 250ML (10MCG/ML) PREMIX INFUSION
INTRAVENOUS | Status: AC
  Filled 2019-08-03: qty 250

## 2019-08-03 MED ORDER — HEPARIN SODIUM (PORCINE) 1000 UNIT/ML IJ SOLN
INTRAMUSCULAR | Status: AC
  Filled 2019-08-03: qty 1

## 2019-08-03 MED ORDER — HEPARIN (PORCINE) 25000 UT/250ML-% IV SOLN
1700.0000 [IU]/h | INTRAVENOUS | Status: AC
  Administered 2019-08-04: 1350 [IU]/h via INTRAVENOUS
  Administered 2019-08-05: 1600 [IU]/h via INTRAVENOUS
  Administered 2019-08-05: 1750 [IU]/h via INTRAVENOUS
  Filled 2019-08-03 (×3): qty 250

## 2019-08-03 MED ORDER — SODIUM CHLORIDE 0.9% IV SOLUTION
Freq: Once | INTRAVENOUS | Status: AC
  Administered 2019-08-04: 10 mL/h via INTRAVENOUS

## 2019-08-03 MED ORDER — VASOPRESSIN 20 UNIT/ML IV SOLN
0.0300 [IU]/min | INTRAVENOUS | Status: DC
  Administered 2019-08-04 (×2): 0.03 [IU]/min via INTRAVENOUS
  Filled 2019-08-03 (×2): qty 2

## 2019-08-03 MED ORDER — ARTIFICIAL TEARS OPHTHALMIC OINT: 1.0000 "application " | TOPICAL_OINTMENT | Freq: Three times a day (TID) | OPHTHALMIC | Status: DC

## 2019-08-03 MED ORDER — SODIUM CHLORIDE 0.9 % IV SOLN: INTRAVENOUS | Status: DC | PRN

## 2019-08-03 MED ORDER — MIDAZOLAM HCL 2 MG/2ML IJ SOLN
INTRAMUSCULAR | Status: AC
  Filled 2019-08-03: qty 2

## 2019-08-03 MED ORDER — VECURONIUM BROMIDE 10 MG IV SOLR
INTRAVENOUS | Status: AC | PRN
  Administered 2019-08-03: 1 ug/kg/min via INTRAVENOUS

## 2019-08-03 MED ORDER — ROCURONIUM BROMIDE 50 MG/5ML IV SOLN
100.0000 mg | Freq: Once | INTRAVENOUS | Status: DC
  Administered 2019-08-03: 100 mg via INTRAVENOUS
  Filled 2019-08-03: qty 10

## 2019-08-03 MED ORDER — PROPOFOL 1000 MG/100ML IV EMUL
0.0000 ug/kg/min | INTRAVENOUS | Status: DC
  Administered 2019-08-03: 40 ug/kg/min via INTRAVENOUS
  Filled 2019-08-03: qty 100

## 2019-08-03 MED ORDER — FENTANYL 2500MCG IN NS 250ML (10MCG/ML) PREMIX INFUSION
50.0000 ug/h | INTRAVENOUS | Status: DC
  Administered 2019-08-03: 07:00:00 50 ug/h via INTRAVENOUS
  Filled 2019-08-03: qty 250

## 2019-08-03 MED ORDER — MIDAZOLAM 50MG/50ML (1MG/ML) PREMIX INFUSION
1.0000 mg/h | INTRAVENOUS | Status: DC
  Administered 2019-08-03 – 2019-08-06 (×14): 10 mg/h via INTRAVENOUS
  Filled 2019-08-03: qty 100
  Filled 2019-08-03 (×13): qty 50
  Filled 2019-08-03: qty 100
  Filled 2019-08-03: qty 50

## 2019-08-03 MED ORDER — SODIUM CHLORIDE 0.9 % IV BOLUS
500.0000 mL | Freq: Once | INTRAVENOUS | Status: AC
  Administered 2019-08-03: 23:00:00 500 mL via INTRAVENOUS

## 2019-08-03 MED ORDER — CALCIUM GLUCONATE-NACL 1-0.675 GM/50ML-% IV SOLN
1.0000 g | Freq: Once | INTRAVENOUS | Status: AC
  Administered 2019-08-04: 1000 mg via INTRAVENOUS
  Filled 2019-08-03: qty 50

## 2019-08-03 MED ORDER — DEXMEDETOMIDINE HCL IN NACL 400 MCG/100ML IV SOLN
0.4000 ug/kg/h | INTRAVENOUS | Status: DC
  Administered 2019-08-03: 0.4 ug/kg/h via INTRAVENOUS
  Filled 2019-08-03: qty 100

## 2019-08-03 MED ORDER — ONDANSETRON HCL 4 MG/2ML IJ SOLN
4.0000 mg | Freq: Four times a day (QID) | INTRAMUSCULAR | Status: DC | PRN
  Administered 2019-09-28 – 2019-10-31 (×6): 4 mg via INTRAVENOUS
  Filled 2019-08-03 (×7): qty 2

## 2019-08-03 MED ORDER — FENTANYL BOLUS VIA INFUSION
50.0000 ug | INTRAVENOUS | Status: DC | PRN
  Administered 2019-08-04 – 2019-08-05 (×12): 50 ug via INTRAVENOUS
  Filled 2019-08-03: qty 50

## 2019-08-03 MED ORDER — FENTANYL CITRATE (PF) 100 MCG/2ML IJ SOLN: 50.0000 ug | Freq: Once | INTRAMUSCULAR | Status: AC

## 2019-08-03 MED ORDER — POTASSIUM CHLORIDE 10 MEQ/50ML IV SOLN: 10.0000 meq | INTRAVENOUS | Status: DC

## 2019-08-03 MED ORDER — ETOMIDATE 2 MG/ML IV SOLN
INTRAVENOUS | Status: AC
  Filled 2019-08-03: qty 20

## 2019-08-03 MED ORDER — MAGNESIUM SULFATE 4 GM/100ML IV SOLN
INTRAVENOUS | Status: AC
  Administered 2019-08-03: 4 g via INTRAVENOUS
  Filled 2019-08-03: qty 100

## 2019-08-03 MED ORDER — PROPOFOL 1000 MG/100ML IV EMUL
INTRAVENOUS | Status: AC
  Filled 2019-08-03: qty 100

## 2019-08-03 MED ORDER — MIDAZOLAM HCL 2 MG/2ML IJ SOLN: 2.0000 mg | INTRAMUSCULAR | Status: DC | PRN

## 2019-08-03 MED ORDER — FENTANYL CITRATE (PF) 100 MCG/2ML IJ SOLN: 50.0000 ug | Freq: Once | INTRAMUSCULAR | Status: DC

## 2019-08-03 MED ORDER — FENTANYL CITRATE (PF) 100 MCG/2ML IJ SOLN
INTRAMUSCULAR | Status: DC | PRN
  Administered 2019-08-03: 200 ug via INTRAVENOUS

## 2019-08-03 MED ORDER — MIDAZOLAM HCL 2 MG/2ML IJ SOLN: 2.0000 mg | Freq: Once | INTRAMUSCULAR | Status: AC

## 2019-08-03 MED ORDER — MIDAZOLAM HCL 2 MG/2ML IJ SOLN
INTRAMUSCULAR | Status: DC | PRN
  Administered 2019-08-03: 5 mg via INTRAVENOUS

## 2019-08-03 MED ORDER — VANCOMYCIN HCL 10 G IV SOLR
2500.0000 mg | Freq: Once | INTRAVENOUS | Status: AC
  Administered 2019-08-03: 2500 mg via INTRAVENOUS
  Filled 2019-08-03: qty 2500

## 2019-08-03 MED ORDER — ZINC SULFATE 220 (50 ZN) MG PO CAPS
220.0000 mg | ORAL_CAPSULE | Freq: Every day | ORAL | Status: DC
  Administered 2019-08-03 – 2019-09-16 (×41): 220 mg
  Filled 2019-08-03 (×41): qty 1

## 2019-08-03 MED ORDER — INSULIN REGULAR(HUMAN) IN NACL 100-0.9 UT/100ML-% IV SOLN
INTRAVENOUS | Status: DC
  Administered 2019-08-03: 1.4 [IU]/h via INTRAVENOUS
  Filled 2019-08-03: qty 100

## 2019-08-03 MED ORDER — SODIUM CHLORIDE 0.9 % IV SOLN
0.0000 ug/kg/min | INTRAVENOUS | Status: DC
  Administered 2019-08-03 – 2019-08-04 (×2): 3 ug/kg/min via INTRAVENOUS
  Administered 2019-08-04: 1.5 ug/kg/min via INTRAVENOUS
  Administered 2019-08-04: 4 ug/kg/min via INTRAVENOUS
  Administered 2019-08-05 (×2): 6 ug/kg/min via INTRAVENOUS
  Administered 2019-08-05: 4 ug/kg/min via INTRAVENOUS
  Administered 2019-08-05: 5 ug/kg/min via INTRAVENOUS
  Administered 2019-08-06 (×4): 6 ug/kg/min via INTRAVENOUS
  Filled 2019-08-03 (×24): qty 20

## 2019-08-03 MED ORDER — ROCURONIUM BROMIDE 10 MG/ML (PF) SYRINGE: 100.0000 mg | PREFILLED_SYRINGE | Freq: Once | INTRAVENOUS | Status: AC

## 2019-08-03 MED ORDER — VECURONIUM BOLUS VIA INFUSION
0.0800 mg/kg | Freq: Once | INTRAVENOUS | Status: DC
  Filled 2019-08-03: qty 11

## 2019-08-03 MED ORDER — VECURONIUM BROMIDE 10 MG IV SOLR
INTRAVENOUS | Status: AC
  Filled 2019-08-03: qty 10

## 2019-08-03 MED ORDER — MIDAZOLAM BOLUS VIA INFUSION
1.0000 mg | INTRAVENOUS | Status: DC | PRN
  Filled 2019-08-03: qty 2

## 2019-08-03 MED ORDER — ROCURONIUM BROMIDE 10 MG/ML (PF) SYRINGE
PREFILLED_SYRINGE | INTRAVENOUS | Status: AC
  Administered 2019-08-03: 100 mg via INTRAVENOUS
  Filled 2019-08-03: qty 10

## 2019-08-03 MED ORDER — PROPOFOL 1000 MG/100ML IV EMUL
5.0000 ug/kg/min | INTRAVENOUS | Status: DC
  Administered 2019-08-03: 07:00:00 5 ug/kg/min via INTRAVENOUS

## 2019-08-03 MED ORDER — MIDAZOLAM HCL 2 MG/2ML IJ SOLN
INTRAMUSCULAR | Status: AC
  Administered 2019-08-03: 2 mg
  Filled 2019-08-03: qty 2

## 2019-08-03 MED ORDER — FENTANYL BOLUS VIA INFUSION
50.0000 ug | INTRAVENOUS | Status: DC | PRN
  Administered 2019-08-03 (×2): 150 ug via INTRAVENOUS
  Filled 2019-08-03: qty 50

## 2019-08-03 MED ORDER — SODIUM CHLORIDE 0.9 % IV SOLN
INTRAVENOUS | Status: AC | PRN
  Administered 2019-08-03: 500 mL via INTRAVENOUS

## 2019-08-03 MED ORDER — SUCCINYLCHOLINE CHLORIDE 200 MG/10ML IV SOSY
PREFILLED_SYRINGE | INTRAVENOUS | Status: AC
  Filled 2019-08-03: qty 10

## 2019-08-03 MED ORDER — MIDAZOLAM 50MG/50ML (1MG/ML) PREMIX INFUSION
2.0000 mg/h | INTRAVENOUS | Status: DC
  Administered 2019-08-03: 10 mg/h via INTRAVENOUS

## 2019-08-03 MED ORDER — FENTANYL CITRATE (PF) 100 MCG/2ML IJ SOLN
50.0000 ug | Freq: Once | INTRAMUSCULAR | Status: AC
  Administered 2019-08-03: 100 ug via INTRAVENOUS

## 2019-08-03 MED ORDER — ASCORBIC ACID 500 MG PO TABS
500.0000 mg | ORAL_TABLET | Freq: Every day | ORAL | Status: DC
  Administered 2019-08-03 – 2019-11-11 (×95): 500 mg
  Filled 2019-08-03 (×97): qty 1

## 2019-08-03 MED ORDER — NOREPINEPHRINE 16 MG/250ML-% IV SOLN: 0.0000 ug/min | INTRAVENOUS | Status: DC

## 2019-08-03 MED ORDER — FENTANYL BOLUS VIA INFUSION
50.0000 ug | INTRAVENOUS | Status: DC | PRN
  Filled 2019-08-03: qty 50

## 2019-08-03 MED ORDER — NOREPINEPHRINE 4 MG/250ML-% IV SOLN
0.0000 ug/min | INTRAVENOUS | Status: DC
  Filled 2019-08-03: qty 250

## 2019-08-03 MED ORDER — MIDAZOLAM 50MG/50ML (1MG/ML) PREMIX INFUSION: 2.0000 mg/h | INTRAVENOUS | Status: DC

## 2019-08-03 MED ORDER — SODIUM CHLORIDE 0.9 % IV SOLN
2.0000 g | Freq: Three times a day (TID) | INTRAVENOUS | Status: DC
  Administered 2019-08-03 – 2019-08-04 (×2): 2 g via INTRAVENOUS
  Filled 2019-08-03 (×4): qty 2

## 2019-08-03 MED ORDER — PROPOFOL 10 MG/ML IV BOLUS
INTRAVENOUS | Status: AC
  Filled 2019-08-03: qty 20

## 2019-08-03 MED ORDER — VECURONIUM BROMIDE 10 MG IV SOLR
INTRAVENOUS | Status: DC | PRN
  Administered 2019-08-03: 13 mg via INTRAVENOUS

## 2019-08-03 MED ORDER — HEPARIN (PORCINE) IN NACL 1000-0.9 UT/500ML-% IV SOLN
INTRAVENOUS | Status: DC | PRN
  Administered 2019-08-03 (×4): 500 mL

## 2019-08-03 MED ORDER — FENTANYL CITRATE (PF) 100 MCG/2ML IJ SOLN
INTRAMUSCULAR | Status: AC
  Filled 2019-08-03: qty 2

## 2019-08-03 MED ORDER — DEXTROSE 50 % IV SOLN: 0.0000 mL | INTRAVENOUS | Status: DC | PRN

## 2019-08-03 MED ORDER — DEXTROSE-NACL 5-0.45 % IV SOLN: INTRAVENOUS | Status: DC

## 2019-08-03 MED ORDER — MAGNESIUM SULFATE 4 GM/100ML IV SOLN: 4.0000 g | Freq: Once | INTRAVENOUS | Status: DC

## 2019-08-03 MED ORDER — STERILE WATER FOR INJECTION IJ SOLN
INTRAMUSCULAR | Status: AC
  Filled 2019-08-03: qty 10

## 2019-08-03 MED ORDER — CHLORHEXIDINE GLUCONATE 0.12% ORAL RINSE (MEDLINE KIT)
15.0000 mL | Freq: Two times a day (BID) | OROMUCOSAL | Status: DC
  Administered 2019-08-03 – 2019-08-09 (×4): 15 mL via OROMUCOSAL

## 2019-08-03 MED ORDER — MIDAZOLAM HCL 2 MG/2ML IJ SOLN
INTRAMUSCULAR | Status: DC | PRN
  Administered 2019-08-03: 4 mg via INTRAVENOUS

## 2019-08-03 MED ORDER — MIDAZOLAM 50MG/50ML (1MG/ML) PREMIX INFUSION
INTRAVENOUS | Status: AC
  Filled 2019-08-03: qty 50

## 2019-08-03 MED ORDER — VECURONIUM BROMIDE 10 MG IV SOLR
0.0000 ug/kg/min | INTRAVENOUS | Status: DC
  Filled 2019-08-03: qty 100

## 2019-08-03 MED ORDER — HEPARIN SODIUM (PORCINE) 1000 UNIT/ML IJ SOLN
INTRAMUSCULAR | Status: DC | PRN
  Administered 2019-08-03: 7500 [IU] via INTRAVENOUS

## 2019-08-03 SURGICAL SUPPLY — 8 items
ELECT DEFIB PAD ADLT CADENCE (PAD) ×1 IMPLANT
GLIDESHEATH SLEND SS 6F .021 (SHEATH) ×1 IMPLANT
GUIDEWIRE INQWIRE 1.5J.035X260 (WIRE) IMPLANT
HOVERMATT SINGLE USE (MISCELLANEOUS) ×1 IMPLANT
INQWIRE 1.5J .035X260CM (WIRE) ×2
KIT ENCORE 26 ADVANTAGE (KITS) ×1 IMPLANT
SHEATH PINNACLE 7F 10CM (SHEATH) ×2 IMPLANT
SHEATH PROBE COVER 6X72 (BAG) ×2 IMPLANT

## 2019-08-03 NOTE — Progress Notes (Addendum)
Patient emergently intubated at bedside d/t hypoxia and altered mentation. Meds as charted, new pIV placed, foley placed and bil UE restraints applied. MD Vera at bedside. Propofol drip started for sedation. Pt able to speak with wife [name]) and his mother prior to intubation. Handoff given to Ama.

## 2019-08-03 NOTE — Progress Notes (Signed)
[facility] PCCM  I called Ms. Conely to let her know that throughout the day today we have struggled to oxygenate her husband successfully.  Given the advanced nature of his ARDS, his bilateral pneumothoraces and lack of improvement moving into the prone position on neuromuscular blockade, I am concerned that his condition will continue to deteriorate on mechanical ventilation.    I discussed his situation with the ECMO team at [name] who is willing to proceed with ECMO.    I explained this to Ms. Pohl in detail.  She voiced understanding.    Roselie Awkward, MD [place] [facility]M Pager: [phone] Cell: [phone] If no response, call [phone]

## 2019-08-03 NOTE — Progress Notes (Signed)
Responded to page to be present in Cath Lab when Mr. Alms arrived from [place].  Chaplain advised Mr. Renald Haithcock + so to keep my distance.  Arrived in Cath Lab 1, doned PPE inc. bonnet, gown, gloves, shoe covers and N95 mask issued at time of employment.  Took up position in observation room behind glass windows the entire time as team prepared Mr. Maston for procedure.  Prayed silently for Mr. Borowiak and medical team.  There was no family present.  Departed as procedure began.  De Burrs Chaplain Resident

## 2019-08-03 NOTE — Progress Notes (Signed)
Patient placed in supine position by MD x 1, RT x 3, and RN x 2 without complications.  ETT re-secured with a commercial tube holder at 28 cm.

## 2019-08-03 NOTE — Progress Notes (Signed)
Page to Dr. Vanita Ingles  Regarding pt's current sats and poor condition

## 2019-08-03 NOTE — Plan of Care (Signed)
  Problem: Respiratory: Goal: Will maintain a patent airway Outcome: Progressing   Problem: Education: Goal: Knowledge of risk factors and measures for prevention of condition will improve Outcome: Not Progressing   Problem: Coping: Goal: Psychosocial and spiritual needs will be supported Outcome: Not Progressing   Problem: Respiratory: Goal: Complications related to the disease process, condition or treatment will be avoided or minimized Outcome: Not Progressing

## 2019-08-03 NOTE — CV Procedure (Signed)
ECMO INITIATION   Patient: [name], [birth year], 43 y.o. Location:   Date of Service:  08/03/2019     Time: 8:38 PM  Date of Admission: 07/22/2019 Admitting diagnosis: COVID-19  Ht: 5' 8"  (172.7 cm) Wt: (!) 136.1 kg BSA: Body surface area is 2.56 meters squared.  Blood Type: O POS Performed at [facility], [place] [address] [place],  [address]  Allergies: No Known Allergies  Past medical history:  Past Medical History:  Diagnosis Date  . Asthma   . GERD (gastroesophageal reflux disease)   . Headache   . History of kidney stones    LEFT URETERAL STONE  . HTN (hypertension)   . Pancreatitis 2018   GALLBALDDER SLUDGE CAUSED ISSUED RESOLVED   Past surgical history:  Past Surgical History:  Procedure Laterality Date  . CYSTOSCOPY/URETEROSCOPY/HOLMIUM LASER/STENT PLACEMENT Left 04/18/2019   Procedure: LEFT URETEROSCOPY/HOLMIUM LASER/STENT PLACEMENT;  Surgeon: Ardis Hughs, MD;  Location: [facility];  Service: Urology;  Laterality: Left;  . CYSTOSCOPY/URETEROSCOPY/HOLMIUM LASER/STENT PLACEMENT Left 05/02/2019   Procedure: CYSTOSCOPY/URETEROSCOPY/HOLMIUM LASER/STENT EXCHANGE;  Surgeon: Ardis Hughs, MD;  Location: [facility];  Service: Urology;  Laterality: Left;  . IRRIGATION AND DEBRIDEMENT SHOULDER Right 09/29/2017   Procedure: IRRIGATION AND DEBRIDEMENT SHOULDER;  Surgeon: Leandrew Koyanagi, MD;  Location: [facility];  Service: Orthopedics;  Laterality: Right;  . LUMBAR DISC SURGERY  2002    Indication for ECMO: ARDS   ECMO was deployed at 1800 and initiated at 2000  Anticoagulation achieved with Heparin bolus of 7500 units given to patient at 74. Cannulated for ECMO Mode: VV and achieved initial ECMO Flow (LPM): 3.5 and ECMO Sweep Gas (LPM): 1.0.    ECMO Cannula Information     Staff Present  Primary Perfusionist Georgia Duff CCP  Assisting Perfusionist/ECMO Specialist Gweneth Fritter CCP  Cannulating Physician Atkins   ECMO Lot  Numbers  CardioHelp Console  #1  Oxygenator  [id number]  Tubing Pack  [id number]  ECMO Goals  Cardiovascular Goals MAP 80  Respiratory/ABG Goals Normalize  Other Goals     Anticoagulation Goals Hep Level > 0.6, aPTT > 70    ECMO Handoff  Patient Information * Age Height Weight BSA IBW BMI  43 y.o. 5' 8"  (172.7 cm)  ((!) 136.1 kg Body surface area is 2.56 meters squared. No data recorded Body mass index is 45.62 kg/m.   Review History * Primary Diagnosis   COVID-19  Prior Cardiac Arrest within 24hrs of ECMO initiation? No  ECMO and MCS * Type ECMO Flow ECMO Sweep Gases   ECMO Device: Cardiohelp   Flow (LPM): 3.5   Sweep Gas (LPM): 1.0     Additional Mechanical Support   Ventilation *    $ Ventilator Initial/Subsequent : Initial, Vent Mode: PRVC, Vt Set: 460 mL, Set Rate: 32 bmp, FiO2 (%): 100 %, I Time: 0.7 Sec(s), PEEP: 18 cmH20     Access Sites * Drainage Cannula    25 Fr. Multi-stage  Return Cannula  21 Fr. Single Stage venous   Peripheral     *Cannula(e) sutured and anchored, secured and dressed.   Infusions and Interventions * Drugs/Dose    Interventions/Blood Products     Labs and Imaging *  *Cannulation position verified via imaging on arrival to ICU. Concerns communicated to attending surgeon. Labs reviewed.   All ECMO safety checks complete. ECMO flowsheet initiated, applicable charges captured, LDA's entered/confirmed, imaging and labs verified, blood products available, and report given to [facility]  [place].

## 2019-08-03 NOTE — Procedures (Signed)
Chest Tube Insertion Procedure Note  Indications:  Clinically significant Pneumothorax  Pre-operative Diagnosis: Pneumothorax  Post-operative Diagnosis: Pneumothorax  Procedure Details  Informed consent was obtained for the procedure, including sedation.  Risks of lung perforation, hemorrhage, arrhythmia, and adverse drug reaction were discussed.   After sterile skin prep, using standard technique, a 28 French tube was placed in the left lateral 6th rib space.  Findings: None  Estimated Blood Loss:  Minimal         Specimens:  Sent serosanguinous fluid              Complications:  None; patient tolerated the procedure well.         Disposition: ICU - intubated and critically ill.         Condition: stable  Attending Attestation: I performed the procedure.  Roselie Awkward, MD [facility] PCCM Pager: [phone] Cell: [phone] If no response, call [phone]

## 2019-08-03 NOTE — Progress Notes (Addendum)
Page sent to Dr. Vanita Ingles to update on pt's status. Critical care team already consulted on this patient. ICU charge RN Marcello Moores also aware of pt's status.

## 2019-08-03 NOTE — Progress Notes (Signed)
NAME:  [name], MRN:  [id number], DOB:  [birth year], LOS: 12 ADMISSION DATE:  07/22/2019, CONSULTATION DATE:  12/24 REFERRING MD:  Sloan Leiter, CHIEF COMPLAINT:  Dyspnea   Brief History   [name] admitted 12/14 with COVID pneumonia causing acute hypoxemic respiratory failure.   Developed pneumomediastinum 12/23 with concern for possible bilateral small pneumothoraces on 12/24.  PCCM consulted for evaluation of pneumothoraces  Past Medical History  GERD HTN Asthma  Significant Hospital Events   12/14 admit with hypoxemic respiratory failure in setting of COVID-19 pneumonia 12/23 noted to have pneumomediastinum on chest x-ray 12/24 PCCM consulted for evaluation 12/26 worsening hypoxemia, intubated   Consults:  PCCM  Procedures:    Significant Diagnostic Tests:  CT chest 12/22 >> extensive pneumomediastinum, multifocal patchy bilateral groundglass opacitiesCT chest 12/22 >> extensive pneumomediastinum, multifocal patchy bilateral groundglass opacities  Micro Data:  BCx2 12/14 >> negative  Sputum 12/15 >> negative   Antimicrobials:  Received remdesivir, steroids, Actemra and convalescent plasma   Interim history/subjective:   Worsening dyspnea starting around 0500 Intubated by anesthesia around 0700  Objective   Blood pressure (!) 156/98, pulse (!) 128, temperature 98.1 F (36.7 C), temperature source Axillary, resp. rate (!) 25, height 5' 8"  (1.727 m), weight 136.1 kg, SpO2 (!) 63 %.    Vent Mode: PRVC FiO2 (%):  [90 %-100 %] 100 % Set Rate:  [24 bmp] 24 bmp Vt Set:  [410 mL] 410 mL PEEP:  [15 cmH20] 15 cmH20   Intake/Output Summary (Last 24 hours) at 08/03/2019 0998 Last data filed at 08/03/2019 0200 Gross per 24 hour  Intake 1590 ml  Output 1100 ml  Net 490 ml   Filed Weights   07/22/19 1300  Weight: 136.1 kg    Examination:  General:  In bed on vent HENT: NCAT ETT in place PULM: CTA B, vent supported breathing CV: RRR, no mgr GI: BS+, soft,  nontender MSK: normal bulk and tone Neuro: sedated on vent       Resolved Hospital Problem list     Assessment & Plan:  Acute Hypoxemic Respiratory Failure secondary to COVID Suspect this will take weeks to recover Decadron 10 day course Intubate now Continue mechanical ventilation per ARDS protocol Target TVol 6-8cc/kgIBW Target Plateau Pressure < 30cm H20 Target driving pressure less than 15 cm of water Target PaO2 55-65: titrate PEEP/FiO2 per protocol As long as PaO2 to FiO2 ratio is less than 1:150 position in prone position for 16 hours a day Check CVP daily if CVL in place Target CVP less than 4, diurese as necessary Ventilator associated pneumonia prevention protocol  Need for sedation for mechanical ventilation PAD protocol RASS target -2 to -3 Fentanyl, propofol infusions and prn versed    Pneumomediastinum with small bilateral pneumothoraces Place bilateral chest tubes    Best practice:   Feeding: start tube feeding Analgesia: fentanyl infusion Sedation: propofol infusion, prn versed Thromboprophylaxis: enoxaparin HOB >30 degrees Ulcer prophylaxis: pantoprazole Glucose control: SSI   Labs   CBC: Recent Labs  Lab 08/01/19 0300 08/02/19 0655  WBC 10.3 15.2*  NEUTROABS  --  11.7*  HGB 15.9 16.4  HCT 49.5 50.4  MCV 82.8 84.3  PLT 186 338    Basic Metabolic Panel: Recent Labs  Lab 07/27/19 1708 07/29/19 0500 07/30/19 0300 07/31/19 0300 08/02/19 0655 08/03/19 0200  NA  --  138 138 136 137 138  K  --  4.1 4.0 4.6 4.0 4.1  CL  --  101 100  101 100 102  CO2  --  23 24 25 24 25   GLUCOSE  --  115* 98 87 84 94  BUN  --  33* 32* 31* 35* 36*  CREATININE  --  0.74 0.80 0.83 0.91 0.88  CALCIUM  --  8.8* 8.7* 8.5* 8.6* 8.8*  MG 2.4  --   --   --  2.1  --   PHOS  --   --   --   --  4.3  --    GFR: Estimated Creatinine Clearance: 143.2 mL/min (by C-G formula based on SCr of 0.88 mg/dL). Recent Labs  Lab 07/29/19 0500 07/30/19 0300  07/31/19 0300 08/01/19 0300 08/02/19 0655  PROCALCITON <0.10 <0.10 <0.10  --   --   WBC  --   --   --  10.3 15.2*    Liver Function Tests: Recent Labs  Lab 08/02/19 0655 08/03/19 0200  AST 31 42*  ALT 25 29  ALKPHOS 75 104  BILITOT 0.9 1.2  PROT 6.2* 6.7  ALBUMIN 3.2* 3.4*   No results for input(s): LIPASE, AMYLASE in the last 168 hours. No results for input(s): AMMONIA in the last 168 hours.  ABG No results found for: PHART, PCO2ART, PO2ART, HCO3, TCO2, ACIDBASEDEF, O2SAT   Coagulation Profile: No results for input(s): INR, PROTIME in the last 168 hours.  Cardiac Enzymes: No results for input(s): CKTOTAL, CKMB, CKMBINDEX, TROPONINI in the last 168 hours.  HbA1C: Hgb A1c MFr Bld  Date/Time Value Ref Range Status  07/24/2019 04:50 AM 6.7 (H) 4.8 - 5.6 % Final    Comment:    (NOTE) Pre diabetes:          5.7%-6.4% Diabetes:              >6.4% Glycemic control for   <7.0% adults with diabetes   09/30/2017 05:36 AM 5.7 (H) 4.8 - 5.6 % Final    Comment:    (NOTE)         Prediabetes: 5.7 - 6.4         Diabetes: >6.4         Glycemic control for adults with diabetes: <7.0     CBG: Recent Labs  Lab 08/01/19 1635 08/01/19 2141 08/02/19 0930 08/02/19 1216 08/02/19 2207  GLUCAP 112* 113* 124* 120* 109*     Critical care time: 45 minutes     Roselie Awkward, MD [place] [facility] Pager: [phone] Cell: [phone] If no response, call [phone]

## 2019-08-03 NOTE — CV Procedure (Signed)
    TRANSESOPHAGEAL ECHOCARDIOGRAM   NAME:  [name]   MRN: [id number] DOB:  1975/10/31   ADMIT DATE: 07/22/2019  INDICATIONS:  Respiratory failure. Need for VV ECMO  PROCEDURE:   The patient was already intubated and sedated. Emergent consent assumed. Procedure done in the cath lab to assess LV and RV function prior to ECMO cannulation   COMPLICATIONS:    There were no immediate complications.  FINDINGS:  LEFT VENTRICLE: Ventricle small and underfilled. EF = 70%. No regional wall motion abnormalities.  RIGHT VENTRICLE: Normal size and function.   LEFT ATRIUM: Normal  LEFT ATRIAL APPENDAGE: Not visulaized  RIGHT ATRIUM: Normal  AORTIC VALVE:  [place]. Normal  MITRAL VALVE:    Normal. Trivial MR  TRICUSPID VALVE: Normal. Mild TR  PULMONIC VALVE: Not well visualized  INTERATRIAL SEPTUM: No obvious ASD.  PERICARDIUM: No effusion  DESCENDING AORTA: Not well visualized  Benay Spice 8:29 PM

## 2019-08-03 NOTE — Progress Notes (Signed)
Echocardiogram Echocardiogram Transesophageal has been performed.  Oneal Deputy Najeh Credit 08/03/2019, 7:49 PM

## 2019-08-03 NOTE — Op Note (Signed)
Procedure(s): ECMO CANNULATION Procedure Note  [name] male 43 y.o. 08/03/2019  Procedure(s) and Anesthesia Type:    * ECMO CANNULATION - Local TEE  Surgeon(s) and Role:    * Wonda Olds, MD - Primary    * Bensimhon, Shaune Pascal, MD - Assisting   Indications: The patient was admitted on transfer from [facility] directly to the Cardiac Catheterization lab for planned v-v ecmo. He is known COVID + with pneumonia and acute decompensation     Surgeon: Wonda Olds   Assistants: Dr. Pierre Bali  Anesthesia: General endotracheal anesthesia  ASA Class: 5    Procedure Detail  ECMO CANNULATION Emergency consent was utilized due to the urgent nature of his physiologic condition. He was placed on the supine position on the cath lab table. Adequate anesthesia was confirmed. A preoperative pause was performed. A TEE was performed which demonstrated good biventricular function and relative volume depletion. The bilateral groins were cleansed and draped sterilely with Chloraprep solution. Percutaneous access of the bilateral common femoral veins was achieved using ultrasound guidance. 7,500 units of unfractionated heparin was given intravenously and ACT was checked 3 minutes later. Once an appropriate ACT was confirmed, the Right CFV was serially dilated over a stiff wire confirmed to be in the SVC by fluoroscopy. A 21 Fr venous cannula was then inserted by modified Seldinger technique with the proximal extent at the level of the RA. This cannula was to function as the "arterial" side of V-V ecmo. The cannula was secured at the skin and flushed with heparinized saline. The left CFV was then instrumented in a similar fashion, this time placing a 23 Fr multichannel venous cannula with the tip at the level of the diaphragm to function as the "venous" cannula during V-V ecmo and to avoid mixing between the two cannulae. The cannula was again flushed with heparinized saline.  The ECMO circuit lines were brought onto the surgical field. The lines were connected to the appropriate cannula taking care to ensure no air was present in the connections. He was then placed on V-V ecmo with immediate improvement in systemic oxygenation. The cannulae were further secured to the skin to avoid malpositioning during transport. Sterile dressings were applied. All sponge, instrument, and needle counts were correct. Both Dr. Haroldine Laws and I were present and participated in all aspects of the procedure.  Findings: Successful ecmo deployment   Estimated Blood Loss:  less than 50 mL         Drains: bilateral pleural tubes         Total IV Fluids: 500 ml saline  Blood Given: none          Specimens: none         Implants: none        Complications:  * No complications entered in OR log *         Disposition: ICU - intubated and critically ill.         Condition: stable  [name] [name], [name]

## 2019-08-03 NOTE — Progress Notes (Addendum)
ANTICOAGULATION CONSULT NOTE - Initial Consult  Pharmacy Consult for heparin Indication: ECMO  No Known Allergies  Patient Measurements: Height: 5' 8"  (172.7 cm) Weight: (!) 300 lb 0.7 oz (136.1 kg) IBW/kg (Calculated) : 68.4 Heparin Dosing Weight: 100.7 kg  Vital Signs: Temp: 99.3 F (37.4 C) (12/26 1600) Temp Source: Axillary (12/26 1600) BP: 101/60 (12/26 2015) Pulse Rate: 111 (12/26 2015)  Labs: Recent Labs    08/01/19 0300 08/02/19 0655 08/03/19 0200 08/03/19 0946 08/03/19 1542 08/03/19 2012  HGB 15.9 16.4  --  18.0* 18.0* 14.3  HCT 49.5 50.4  --  53.0* 53.0* 42.0  PLT 186 185  --   --   --   --   CREATININE  --  0.91 0.88  --   --   --     Estimated Creatinine Clearance: 143.2 mL/min (by C-G formula based on SCr of 0.88 mg/dL).   Medical History: Past Medical History:  Diagnosis Date  . Asthma   . GERD (gastroesophageal reflux disease)   . Headache   . History of kidney stones    LEFT URETERAL STONE  . HTN (hypertension)   . Pancreatitis 2018   GALLBALDDER SLUDGE CAUSED ISSUED RESOLVED    Medications:  Scheduled:  . artificial tears  1 application Both Eyes F8H  . vitamin C  500 mg Per Tube Daily  . benzonatate  100 mg Oral TID  . chlorhexidine gluconate (MEDLINE KIT)  15 mL Mouth Rinse BID  . Chlorhexidine Gluconate Cloth  6 each Topical Daily  . dexamethasone (DECADRON) injection  6 mg Intravenous Q24H  . feeding supplement (PRO-STAT SUGAR FREE 64)  30 mL Per Tube BID  . feeding supplement (VITAL HIGH PROTEIN)  1,000 mL Per Tube Q24H  . fentaNYL (SUBLIMAZE) injection  50 mcg Intravenous Once  . fentaNYL (SUBLIMAZE) injection  50 mcg Intravenous Once  . insulin aspart  0-15 Units Subcutaneous TID WC  . insulin aspart  0-5 Units Subcutaneous QHS  . insulin detemir  20 Units Subcutaneous BID  . mouth rinse  15 mL Mouth Rinse BID  . mouth rinse  15 mL Mouth Rinse 10 times per day  . metoprolol tartrate  75 mg Oral BID  . pantoprazole (PROTONIX)  IV  40 mg Intravenous Daily  . pantoprazole (PROTONIX) IV  40 mg Intravenous QHS  . zinc sulfate  220 mg Per Tube Daily    Assessment: 40 yom known COVID+, has been declining with ARDS for past 48 hours requiring intubation and max vent settings. Underwent VV ECMO on 12/26.   Was on enoxaparin 70 mg on 12/26 at 1047. Received 7500 units of heparin in cath lab for cannulization. Started on heparin at 1500 units/hr. Hgb 14.3, plt 246. No s/sx of bleeding.     Goal of Therapy:  Heparin level 0.3-0.7 units/ml >>targeting at higher end of goal range aPTT 66-102 seconds  Monitor platelets by anticoagulation protocol: Yes   Plan:  Start heparin infusion at 1500 units/hr Check anti-Xa and aPTT level in 6 hours and daily while on heparin Continue to monitor H&H and platelets  Antonietta Jewel, PharmD, [place] Pharmacist  Phone: [phone]  Please check AMION for all [place] phone numbers After 10:00 PM, call [place] [phone] 08/03/2019,8:56 PM   ADDENDUM Aptt >200 and heparin level supratherapeutic at 1.32 - anticipate related to bolus in cath lab still. Next labs due on 12/27 at 0200 - plan to continue heparin at same rate and adjust pending results.  No s/sx of bleeding besides small cut on lips.  Antonietta Jewel, PharmD, [place] Clinical Pharmacist

## 2019-08-03 NOTE — Progress Notes (Signed)
2952 Patient sedated and Chest tube #[id number] inserted into left side per Dr. Lake Bells.  0801 Right Jugular Central Line inserted utilizing ultrasound and sterile technique per Dr. Lake Bells [phone] Patient sedated, chest tube inserted into right side per Dr. Lake Bells.

## 2019-08-03 NOTE — Progress Notes (Signed)
PROGRESS NOTE    [name]  MWU:[id number] DOB: 1975-09-16 DOA: 07/22/2019 PCP: Tamsen Roers, MD    Brief Narrative:  43 year old gentleman with history of hypertension, GERD and asthma who presented to the emergency department with worsening cough and shortness of breath with fever for about 1 week.  He was tested COVID-19, 3 days prior to admission.  In the emergency room he was hypoxic with oxygen saturation 88% on room air and placed on 2 L oxygen.  Chest x-ray showed pneumonia.  Patient was admitted to hospital and subsequently placed on 10 L of nonrebreather, he was treated with remdesivir and steroids, given convalescent plasma and Actemra on 07/25/2019.  Patient continues to require high flow oxygen and has developed pneumomediastinum and small bilateral pneumothoraces. 08/01/2019: Still remains on high flow oxygen with pneumomediastinum and pneumothoraces.  Transferred to ICU. 08/03/2019: Severe hypoxia, intubated early morning, bilateral chest tube drainage.  Assessment & Plan:   Principal Problem:   Acute respiratory failure with hypoxia (HCC) Active Problems:   GERD (gastroesophageal reflux disease)   Essential hypertension   Persistent asthma with undetermined severity   Thrombocytopenia (HCC)   Leukopenia   Pneumonia due to COVID-19 virus   Subcutaneous crepitus  Acute hypoxemic respiratory failure due to COVID-19 viral infection, COVID-19 pneumonia with ARDS, complicated by pneumomediastinum and bilateral small pneumothorax: Unable to maintain airway, intubated and on mechanical ventilation. Bilateral chest tube placement, to low pressure suction. Covid directed therapy with , steroids, on dexamethasone, continue remdesivir, on extended therapy, treated for 10 days.   actemra and convalescent plasma given on 07/25/2019. Appreciate intensivist help. We will insert OG tube and start weaning.  Hypertension: Blood pressure are stable.  Newly diagnosed type 2  diabetes: Hemoglobin A1c 6.7.  Not on treatment at home.  Blood sugars elevated with concomitant use of steroids.  Continue insulin coverage. Starting on tube feeding, continue current dose of insulin.  Will adjust accordingly.  DVT prophylaxis: Lovenox subcu Code Status: Full code.   Family Communication: called and updated patient's wife about the intubations , chest tubes and very critical situation Disposition Plan: Remains critically ill in intensive care unit.   Consultants:   PCCM  Procedures:   None  Antimicrobials:  Anti-infectives (From admission, onward)   Start     Dose/Rate Route Frequency Ordered Stop   07/27/19 1000  remdesivir 100 mg in sodium chloride 0.9 % 100 mL IVPB     100 mg 200 mL/hr over 30 Minutes Intravenous Daily 07/26/19 1442 07/31/19 0948   07/23/19 1000  remdesivir 100 mg in sodium chloride 0.9 % 100 mL IVPB     100 mg 200 mL/hr over 30 Minutes Intravenous Daily 07/22/19 1302 07/26/19 1046   07/22/19 1400  remdesivir 200 mg in sodium chloride 0.9% 250 mL IVPB     200 mg 580 mL/hr over 30 Minutes Intravenous Once 07/22/19 1302 07/22/19 1512         Subjective: Patient seen and examined.  Examined early morning with critical care during procedure at bedside. Decompensated at about 5 AM in the morning, could not keep up with saturations dropping less than 60%, intubated and mechanical ventilation is started. Also received bilateral chest tube.  Objective: Vitals:   08/03/19 1100 08/03/19 1131 08/03/19 1200 08/03/19 1300  BP: 106/79 124/78 125/87 126/87  Pulse: (!) 134 (!) 132 (!) 132 (!) 131  Resp: (!) 33 (!) 37 17 (!) 32  Temp:    98.4 F (36.9 C)  TempSrc:  Axillary  SpO2: (!) 78% (!) 78% (!) 79% (!) 86%  Weight:      Height:        Intake/Output Summary (Last 24 hours) at 08/03/2019 1353 Last data filed at 08/03/2019 0200 Gross per 24 hour  Intake 1110 ml  Output 1100 ml  Net 10 ml   Filed Weights   07/22/19 1300  Weight:  136.1 kg    Examination:  Physical Exam  Constitutional:  Sedated on ventilator.  HENT:  Head: Normocephalic and atraumatic.  Cardiovascular: Normal rate and regular rhythm.  Pulmonary/Chest:  On mechanical ventilation.  Poor bilateral air entry.  Superficial crepitations.  Bilateral chest tube with air leak.  Abdominal: Bowel sounds are normal.  Musculoskeletal:     Cervical back: Neck supple.      Data Reviewed: I have personally reviewed following labs and imaging studies  CBC: Recent Labs  Lab 08/01/19 0300 08/02/19 0655 08/03/19 0946  WBC 10.3 15.2*  --   NEUTROABS  --  11.7*  --   HGB 15.9 16.4 18.0*  HCT 49.5 50.4 53.0*  MCV 82.8 84.3  --   PLT 186 185  --    Basic Metabolic Panel: Recent Labs  Lab 07/27/19 1708 07/29/19 0500 07/30/19 0300 07/31/19 0300 08/02/19 0655 08/03/19 0200 08/03/19 0946 08/03/19 1040  NA  --  138 138 136 137 138 137  --   K  --  4.1 4.0 4.6 4.0 4.1 4.7  --   CL  --  101 100 101 100 102  --   --   CO2  --  23 24 25 24 25   --   --   GLUCOSE  --  115* 98 87 84 94  --   --   BUN  --  33* 32* 31* 35* 36*  --   --   CREATININE  --  0.74 0.80 0.83 0.91 0.88  --   --   CALCIUM  --  8.8* 8.7* 8.5* 8.6* 8.8*  --   --   MG 2.4  --   --   --  2.1  --   --  2.4  PHOS  --   --   --   --  4.3  --   --  7.1*   GFR: Estimated Creatinine Clearance: 143.2 mL/min (by C-G formula based on SCr of 0.88 mg/dL). Liver Function Tests: Recent Labs  Lab 08/02/19 0655 08/03/19 0200  AST 31 42*  ALT 25 29  ALKPHOS 75 104  BILITOT 0.9 1.2  PROT 6.2* 6.7  ALBUMIN 3.2* 3.4*   No results for input(s): LIPASE, AMYLASE in the last 168 hours. No results for input(s): AMMONIA in the last 168 hours. Coagulation Profile: No results for input(s): INR, PROTIME in the last 168 hours. Cardiac Enzymes: No results for input(s): CKTOTAL, CKMB, CKMBINDEX, TROPONINI in the last 168 hours. BNP (last 3 results) No results for input(s): PROBNP in the last  8760 hours. HbA1C: No results for input(s): HGBA1C in the last 72 hours. CBG: Recent Labs  Lab 08/01/19 2141 08/02/19 0930 08/02/19 1216 08/02/19 2207 08/03/19 1218  GLUCAP 113* 124* 120* 109* 138*   Lipid Profile: Recent Labs    08/03/19 1040  TRIG 809*   Thyroid Function Tests: No results for input(s): TSH, T4TOTAL, FREET4, T3FREE, THYROIDAB in the last 72 hours. Anemia Panel: No results for input(s): VITAMINB12, FOLATE, FERRITIN, TIBC, IRON, RETICCTPCT in the last 72 hours. Sepsis Labs: Recent Labs  Lab 07/29/19  0500 07/30/19 0300 07/31/19 0300  PROCALCITON <0.10 <0.10 <0.10    Recent Results (from the past 240 hour(s))  MRSA PCR Screening     Status: None   Collection Time: 08/02/19  1:00 AM   Specimen: Nasal Mucosa; Nasopharyngeal  Result Value Ref Range Status   MRSA by PCR NEGATIVE NEGATIVE Final    Comment:        The GeneXpert MRSA Assay (FDA approved for NASAL specimens only), is one component of a comprehensive MRSA colonization surveillance program. It is not intended to diagnose MRSA infection nor to guide or monitor treatment for MRSA infections. Performed at [facility], [place] [address] [place], [place] [address]          Radiology Studies: DG Abd 1 View  Result Date: 08/03/2019 CLINICAL DATA:  NG tube placement EXAM: ABDOMEN - 1 VIEW COMPARISON:  None. FINDINGS: 0948 hours. NG tube tip is in the distal stomach near the pylorus. Bowel gas pattern is nonobstructive. Visualized bony anatomy unremarkable. Subcutaneous emphysema noted lower right chest wall. IMPRESSION: NG tube tip is positioned in the distal stomach. Electronically Signed   By: Misty Stanley M.D.   On: 08/03/2019 10:27   DG Chest Port 1 View  Result Date: 08/03/2019 CLINICAL DATA:  COVID-19 positive. ARDS. EXAM: PORTABLE CHEST 1 VIEW COMPARISON:  August 03, 2019 FINDINGS: Extensive subcutaneous emphysema is identified over bilateral chest. Bilateral  chest tubes, endotracheal tube are identified. Feeding tube is identified with distal tip not included on film but is at least in the stomach. Diffuse patchy consolidation of bilateral lungs are identified unchanged. IMPRESSION: 1. Extensive subcutaneous emphysema is identified over bilateral chest. 2. Bilateral chest tubes are identified. 3. Diffuse patchy consolidation of bilateral lungs are identified unchanged. Electronically Signed   By: Abelardo Diesel M.D.   On: 08/03/2019 10:37   DG CHEST PORT 1 VIEW  Result Date: 08/03/2019 CLINICAL DATA:  Intubation. EXAM: PORTABLE CHEST 1 VIEW COMPARISON:  08/02/2019 FINDINGS: Endotracheal tube has tip 8.4 cm above the carina. Lungs are hypoinflated demonstrate a stable small left-sided pneumothorax. Previously seen small right apical pneumothorax is not well visualized on the current exam. Persistent moderate bilateral airspace opacification without significant change. Cardiomediastinal silhouette is within normal. Persistent moderate subcutaneous emphysema over the neck and chest. IMPRESSION: 1. Moderate bilateral persistent airspace process without significant change likely multifocal infection. Stable subcutaneous emphysema over the neck and chest. 2. Previously seen small right apical pneumothorax not visualized on the current exam. Stable small left pneumothorax. 3.  Endotracheal tube with tip 8.4 cm above the carina. Electronically Signed   By: Marin Olp M.D.   On: 08/03/2019 08:02   DG CHEST PORT 1 VIEW  Result Date: 08/02/2019 CLINICAL DATA:  Bilateral pneumothoraces. EXAM: PORTABLE CHEST 1 VIEW COMPARISON:  08/01/2019 FINDINGS: Examination demonstrates small bilateral pneumothoraces without significant change. Moderate bilateral airspace opacification without significant change. Cardiomediastinal silhouette is within normal. Moderate subcutaneous emphysema over the neck and chest unchanged. IMPRESSION: 1. Persistent moderate bilateral multifocal  airspace process likely multifocal pneumonia. 2. Stable small bilateral pneumothoraces. Subcutaneous emphysema over the neck and chest unchanged. Electronically Signed   By: Marin Olp M.D.   On: 08/02/2019 09:26        Scheduled Meds: . artificial tears  1 application Both Eyes J5T  . vitamin C  500 mg Per Tube Daily  . benzonatate  100 mg Oral TID  . chlorhexidine gluconate (MEDLINE KIT)  15 mL Mouth Rinse BID  .  Chlorhexidine Gluconate Cloth  6 each Topical Daily  . dexamethasone (DECADRON) injection  6 mg Intravenous Q24H  . enoxaparin (LOVENOX) injection  70 mg Subcutaneous Daily  . feeding supplement (PRO-STAT SUGAR FREE 64)  30 mL Per Tube BID  . feeding supplement (VITAL HIGH PROTEIN)  1,000 mL Per Tube Q24H  . insulin aspart  0-15 Units Subcutaneous TID WC  . insulin aspart  0-5 Units Subcutaneous QHS  . insulin detemir  20 Units Subcutaneous BID  . mouth rinse  15 mL Mouth Rinse BID  . mouth rinse  15 mL Mouth Rinse 10 times per day  . metoprolol tartrate  75 mg Oral BID  . pantoprazole (PROTONIX) IV  40 mg Intravenous Daily  . zinc sulfate  220 mg Per Tube Daily   Continuous Infusions: . sodium chloride    . dexmedetomidine (PRECEDEX) IV infusion    . fentaNYL infusion INTRAVENOUS 200 mcg/hr (08/03/19 0909)  . midazolam    . norepinephrine (LEVOPHED) Adult infusion       LOS: 12 days    Time spent: 30 minutes    Barb Merino, MD [facility] Pager [phone]

## 2019-08-03 NOTE — H&P (Signed)
Advanced Heart Failure Team Consult Note   Primary Physician: Tamsen Roers, MD PCP-Cardiologist:  No primary care provider on file.  Reason for Consultation: Respiratory failure due to COVID PNA - need for ECMO  HPI:    [name] is seen today for evaluation of respiratory failure at the request of Dr. Pennie Banter.  43 y/o obese male with HTN, asthma, GERD admitted to [facility] about 2 weeks ago with COVID PNA. Has had progressive worsening ARDS.  Intubated this am and underwent bilateral CTs. Eventually proved but remian hypoxic with sats in 70-80s on 100% FiO2.   Last ABG 7.17/80/52/74%   CXR with diffuse bilateral infiltrates   Review of Systems: unable to obtain due to intubated    Home Medications Prior to Admission medications   Medication Sig Start Date End Date Taking? Authorizing Provider  Albuterol Sulfate (PROAIR RESPICLICK) 702 (90 Base) MCG/ACT AEPB Inhale 2 puffs into the lungs every 4 (four) hours as needed (Shortness of breath). 05/28/19  Yes Valentina Shaggy, MD  budesonide-formoterol [facility]) 160-4.5 MCG/ACT inhaler Inhale 2 puffs into the lungs daily as needed (cough or wheeze). Inhale two puffs ONCE daily to prevent cough or wheeze. Rinse, gargle, and spit after use. Patient taking differently: Inhale 2 puffs into the lungs daily as needed (cough or wheeze). Rinse, gargle and spit after use. 05/28/19  Yes Valentina Shaggy, MD  cetirizine (ZYRTEC) 10 MG tablet Take 1 tablet (10 mg total) by mouth daily. 02/06/18  Yes Kozlow, Donnamarie Poag, MD  lisinopril-hydrochlorothiazide (ZESTORETIC) 10-12.5 MG tablet Take 1 tablet by mouth daily. 04/08/19  Yes [provider]  omeprazole (PRILOSEC) 20 MG capsule Take 40 mg by mouth daily.    Yes [provider]  triamcinolone (NASACORT ALLERGY 24HR) 55 MCG/ACT AERO nasal inhaler Place 2 sprays into the nose daily. Patient taking differently: Place 2 sprays into the nose daily as needed (allergies).   02/06/18  Yes Kozlow, Donnamarie Poag, MD    Past Medical History: Past Medical History:  Diagnosis Date  . Asthma   . GERD (gastroesophageal reflux disease)   . Headache   . History of kidney stones    LEFT URETERAL STONE  . HTN (hypertension)   . Pancreatitis 2018   GALLBALDDER SLUDGE CAUSED ISSUED RESOLVED    Past Surgical History: Past Surgical History:  Procedure Laterality Date  . CYSTOSCOPY/URETEROSCOPY/HOLMIUM LASER/STENT PLACEMENT Left 04/18/2019   Procedure: LEFT URETEROSCOPY/HOLMIUM LASER/STENT PLACEMENT;  Surgeon: Ardis Hughs, MD;  Location: [facility];  Service: Urology;  Laterality: Left;  . CYSTOSCOPY/URETEROSCOPY/HOLMIUM LASER/STENT PLACEMENT Left 05/02/2019   Procedure: CYSTOSCOPY/URETEROSCOPY/HOLMIUM LASER/STENT EXCHANGE;  Surgeon: Ardis Hughs, MD;  Location: [facility];  Service: Urology;  Laterality: Left;  . IRRIGATION AND DEBRIDEMENT SHOULDER Right 09/29/2017   Procedure: IRRIGATION AND DEBRIDEMENT SHOULDER;  Surgeon: Leandrew Koyanagi, MD;  Location: [facility];  Service: Orthopedics;  Laterality: Right;  . LUMBAR [facility]  2002    Family History: Family History  Problem Relation Age of Onset  . Asthma Brother   . Diabetes Father   . Hypertension Father   . Diabetes Paternal Grandmother   . Hypertension Paternal Grandmother   . Migraines Mother   . GER disease Mother   . Pancreatic cancer Maternal Grandmother   . Heart disease Maternal Grandfather     Social History: Social History   Socioeconomic History  . Marital status: Married    Spouse name: Not on file  . Number of children: 1  .  Years of education: Not on file  . Highest education level: Not on file  Occupational History  . Occupation: delivery driver  Tobacco Use  . Smoking status: Never Smoker  . Smokeless tobacco: Never Used  Substance and Sexual Activity  . Alcohol use: Yes    Comment: rarely 2-3 times a year  . Drug use: No  . Sexual activity: Yes    Partners:  Female  Other Topics Concern  . Not on file  Social History Narrative  . Not on file   Social Determinants of Health   Financial Resource Strain:   . Difficulty of Paying Living Expenses: Not on file  Food Insecurity:   . Worried About Charity fundraiser in the Last Year: Not on file  . Ran Out of Food in the Last Year: Not on file  Transportation Needs:   . Lack of Transportation (Medical): Not on file  . Lack of Transportation (Non-Medical): Not on file  Physical Activity:   . Days of Exercise per Week: Not on file  . Minutes of Exercise per Session: Not on file  Stress:   . Feeling of Stress : Not on file  Social Connections:   . Frequency of Communication with Friends and Family: Not on file  . Frequency of Social Gatherings with Friends and Family: Not on file  . Attends Religious Services: Not on file  . Active Member of Clubs or Organizations: Not on file  . Attends Archivist Meetings: Not on file  . Marital Status: Not on file    Allergies:  No Known Allergies  Objective:    Vital Signs:   Temp:  [98.1 F (36.7 C)-99.3 F (37.4 C)] 99.3 F (37.4 C) (12/26 1600) Pulse Rate:  [88-146] 140 (12/26 1700) Resp:  [16-38] 32 (12/26 1700) BP: (106-188)/(65-142) 129/81 (12/26 1600) SpO2:  [57 %-93 %] 84 % (12/26 1700) Arterial Line BP: (98-143)/(61-83) 98/61 (12/26 1600) FiO2 (%):  [100 %] 100 % (12/26 1600) Last BM Date: 08/01/19  Weight change: Filed Weights   07/22/19 1300  Weight: 136.1 kg    Intake/Output:   Intake/Output Summary (Last 24 hours) at 08/03/2019 1750 Last data filed at 08/03/2019 0200 Gross per 24 hour  Intake 1110 ml  Output 700 ml  Net 410 ml      Physical Exam    General:  Obese male. Intubated sedated HEENT: normal + ETT Neck: supple. JVP elevated . Carotids 2+ bilat; no bruits. No lymphadenopathy or thyromegaly appreciated. Cor: PMI nondisplaced. Tachy regular. No rubs, gallops or murmurs. Lungs: diffuse  crackles Abdomen: obese soft, nontender, nondistended. No hepatosplenomegaly. No bruits or masses. Good bowel sounds. Extremities: no clubbing, rash, edema + cyanosis Neuro: intubated. Sedated and parqlyzed   Telemetry   Sinus tach 130-140s Personally reviewed   EKG    Sinus tach 127 Personally reviewed   Labs   Basic Metabolic Panel: Recent Labs  Lab 07/29/19 0500 07/30/19 0300 07/31/19 0300 08/02/19 0655 08/03/19 0200 08/03/19 0946 08/03/19 1040 08/03/19 1542  NA 138 138 136 137 138 137  --  135  K 4.1 4.0 4.6 4.0 4.1 4.7  --  5.7*  CL 101 100 101 100 102  --   --   --   CO2 _0 --   --   --   GLUCOSE 115* 98 87 84 94  --   --   --   BUN 33* 32* 31* 35* 36*  --   --   --  CREATININE 0.74 0.80 0.83 0.91 0.88  --   --   --   CALCIUM 8.8* 8.7* 8.5* 8.6* 8.8*  --   --   --   MG  --   --   --  2.1  --   --  2.4  --   PHOS  --   --   --  4.3  --   --  7.1*  --     Liver Function Tests: Recent Labs  Lab 08/02/19 0655 08/03/19 0200  AST 31 42*  ALT 25 29  ALKPHOS 75 104  BILITOT 0.9 1.2  PROT 6.2* 6.7  ALBUMIN 3.2* 3.4*   No results for input(s): LIPASE, AMYLASE in the last 168 hours. No results for input(s): AMMONIA in the last 168 hours.  CBC: Recent Labs  Lab 08/01/19 0300 08/02/19 0655 08/03/19 0946 08/03/19 1542  WBC 10.3 15.2*  --   --   NEUTROABS  --  11.7*  --   --   HGB 15.9 16.4 18.0* 18.0*  HCT 49.5 50.4 53.0* 53.0*  MCV 82.8 84.3  --   --   PLT 186 185  --   --     Cardiac Enzymes: No results for input(s): CKTOTAL, CKMB, CKMBINDEX, TROPONINI in the last 168 hours.  BNP: BNP (last 3 results) Recent Labs    07/22/19 1039  BNP 15.3    ProBNP (last 3 results) No results for input(s): PROBNP in the last 8760 hours.   CBG: Recent Labs  Lab 08/01/19 2141 08/02/19 0930 08/02/19 1216 08/02/19 2207 08/03/19 1218  GLUCAP 113* 124* 120* 109* 138*    Coagulation Studies: No results for input(s): LABPROT, INR in  the last 72 hours.   Imaging   DG Abd 1 View  Result Date: 08/03/2019 CLINICAL DATA:  NG tube placement EXAM: ABDOMEN - 1 VIEW COMPARISON:  None. FINDINGS: 0948 hours. NG tube tip is in the distal stomach near the pylorus. Bowel gas pattern is nonobstructive. Visualized bony anatomy unremarkable. Subcutaneous emphysema noted lower right chest wall. IMPRESSION: NG tube tip is positioned in the distal stomach. Electronically Signed   By: Misty Stanley M.D.   On: 08/03/2019 10:27   DG Chest Port 1 View  Result Date: 08/03/2019 CLINICAL DATA:  COVID-19 positive. ARDS. EXAM: PORTABLE CHEST 1 VIEW COMPARISON:  August 03, 2019 FINDINGS: Extensive subcutaneous emphysema is identified over bilateral chest. Bilateral chest tubes, endotracheal tube are identified. Feeding tube is identified with distal tip not included on film but is at least in the stomach. Diffuse patchy consolidation of bilateral lungs are identified unchanged. IMPRESSION: 1. Extensive subcutaneous emphysema is identified over bilateral chest. 2. Bilateral chest tubes are identified. 3. Diffuse patchy consolidation of bilateral lungs are identified unchanged. Electronically Signed   By: Abelardo Diesel M.D.   On: 08/03/2019 10:37   DG CHEST PORT 1 VIEW  Result Date: 08/03/2019 CLINICAL DATA:  Intubation. EXAM: PORTABLE CHEST 1 VIEW COMPARISON:  08/02/2019 FINDINGS: Endotracheal tube has tip 8.4 cm above the carina. Lungs are hypoinflated demonstrate a stable small left-sided pneumothorax. Previously seen small right apical pneumothorax is not well visualized on the current exam. Persistent moderate bilateral airspace opacification without significant change. Cardiomediastinal silhouette is within normal. Persistent moderate subcutaneous emphysema over the neck and chest. IMPRESSION: 1. Moderate bilateral persistent airspace process without significant change likely multifocal infection. Stable subcutaneous emphysema over the neck and  chest. 2. Previously seen small right apical pneumothorax not visualized on the current exam.  Stable small left pneumothorax. 3.  Endotracheal tube with tip 8.4 cm above the carina. Electronically Signed   By: Marin Olp M.D.   On: 08/03/2019 08:02      Medications:     Current Medications: . artificial tears  1 application Both Eyes M6K  . vitamin C  500 mg Per Tube Daily  . benzonatate  100 mg Oral TID  . chlorhexidine gluconate (MEDLINE KIT)  15 mL Mouth Rinse BID  . Chlorhexidine Gluconate Cloth  6 each Topical Daily  . dexamethasone (DECADRON) injection  6 mg Intravenous Q24H  . enoxaparin (LOVENOX) injection  70 mg Subcutaneous Daily  . feeding supplement (PRO-STAT SUGAR FREE 64)  30 mL Per Tube BID  . feeding supplement (VITAL HIGH PROTEIN)  1,000 mL Per Tube Q24H  . fentaNYL (SUBLIMAZE) injection  50 mcg Intravenous Once  . insulin aspart  0-15 Units Subcutaneous TID WC  . insulin aspart  0-5 Units Subcutaneous QHS  . insulin detemir  20 Units Subcutaneous BID  . mouth rinse  15 mL Mouth Rinse BID  . mouth rinse  15 mL Mouth Rinse 10 times per day  . metoprolol tartrate  75 mg Oral BID  . pantoprazole (PROTONIX) IV  40 mg Intravenous Daily  . vecuronium  0.08 mg/kg Intravenous Once  . zinc sulfate  220 mg Per Tube Daily     Infusions: . sodium chloride    . fentaNYL infusion INTRAVENOUS 275 mcg/hr (08/03/19 1747)  . midazolam 10 mg/hr (08/03/19 1425)  . midazolam 10 mg/hr (08/03/19 1748)  . norepinephrine (LEVOPHED) Adult infusion Stopped (08/03/19 1525)  . vecuronium (NORCURON) infusion          Assessment/Plan   1. Acute hypoxic/hypercarbic respiratory failure due to COViD PNA - remains markedly hypoxic despite full support - case d/w CCM and ECMO team. Plan VV ECMO via bilateral femoral cannulation - will need bedside echo prior to cannulation to make sure cardiac function ok.  - continue Rx for COVID PNA per CCM   CRITICAL CARE Performed by:  Glori Bickers  Total critical care time: 60 minutes  Critical care time was exclusive of separately billable procedures and treating other patients.  Critical care was necessary to treat or prevent imminent or life-threatening deterioration.  Critical care was time spent personally by me (independent of midlevel providers or residents) on the following activities: development of treatment plan with patient and/or surrogate as well as nursing, discussions with consultants, evaluation of patient's response to treatment, examination of patient, obtaining history from patient or surrogate, ordering and performing treatments and interventions, ordering and review of laboratory studies, ordering and review of radiographic studies, pulse oximetry and re-evaluation of patient's condition.    Length of Stay: [place], MD  08/03/2019, 5:50 PM  Advanced Heart Failure Team Pager [phone] (M-F; 7a - 4p)  Please contact [facility] for night-coverage after hours (4p -7a ) and weekends on [email or web address]

## 2019-08-03 NOTE — Anesthesia Procedure Notes (Signed)
Procedure Name: Intubation Date/Time: 08/03/2019 6:46 AM Performed by: Jericha Bryden T, CRNA Pre-anesthesia Checklist: Patient identified, Emergency Drugs available, Suction available and Patient being monitored Patient Re-evaluated:Patient Re-evaluated prior to induction Oxygen Delivery Method: Circle system utilized Preoxygenation: Pre-oxygenation with 100% oxygen Induction Type: IV induction and Rapid sequence Ventilation: Mask ventilation without difficulty Laryngoscope Size: Glidescope and 3 Grade View: Grade I Tube type: Oral Tube size: 7.5 mm Number of attempts: 1 Airway Equipment and Method: Stylet,  Oral airway and Video-laryngoscopy Placement Confirmation: ETT inserted through vocal cords under direct vision,  breath sounds checked- equal and bilateral and CO2 detector Secured at: 25 cm Tube secured with: Tape Dental Injury: Teeth and Oropharynx as per pre-operative assessment

## 2019-08-03 NOTE — Progress Notes (Signed)
Pharmacy Antibiotic Note  [name] is a 43 y.o. male admitted on 07/22/2019 with ECMO.  Pharmacy has been consulted for cefepime and vancomycin dosing.  43 yom known COVID+, has been declining with ARDS for past 48 hours requiring intubation and max vent settings. Underwent VV ECMO on 12/26.   WBC 15.2, CRP 0.7, afebrile. Scr 0.88 (normCrCl> 100 mL/min).   Plan: Vancomycin 2.5 g IV once then 1250 mg IV every 12 hours  Cefepime 2 g IV every 8 hours Monitor renal fx, cx results, clinical pic, and vanc levels as appropriate  Height: 5' 8"  (172.7 cm) Weight: (!) 300 lb 0.7 oz (136.1 kg) IBW/kg (Calculated) : 68.4  Temp (24hrs), Avg:98.5 F (36.9 C), Min:98.1 F (36.7 C), Max:99.3 F (37.4 C)  Recent Labs  Lab 07/29/19 0500 07/30/19 0300 07/31/19 0300 08/01/19 0300 08/02/19 0655 08/03/19 0200  WBC  --   --   --  10.3 15.2*  --   CREATININE 0.74 0.80 0.83  --  0.91 0.88    Estimated Creatinine Clearance: 143.2 mL/min (by C-G formula based on SCr of 0.88 mg/dL).    No Known Allergies  Antimicrobials this admission: Vancomycin 12/26 >>  Cefepime 12/26 >>   Dose adjustments this admission: N/A  Microbiology results: 12/14 BCx: NGTD 12/25 Sputum: neg  12/15 MRSA PCR: normal flora  Thank you for allowing pharmacy to be a part of this patient's care.  Antonietta Jewel, PharmD, BCCCP Clinical Pharmacist  Phone: [phone]  Please check AMION for all [place] phone numbers After 10:00 PM, call [place] [phone] 08/03/2019 10:05 PM

## 2019-08-03 NOTE — Progress Notes (Signed)
Prone ABG results given to Dr. Lake Bells at pt bedside. No new orders at this time. RT will continue to monitor.

## 2019-08-03 NOTE — Procedures (Signed)
Central Venous Catheter Insertion Procedure Note [name] [id number] 1976-02-20  Procedure: Insertion of Central Venous Catheter Indications: Drug and/or fluid administration  Procedure Details Consent: Unable to obtain consent because of emergent medical necessity. Time Out: Verified patient identification, verified procedure, site/side was marked, verified correct patient position, special equipment/implants available, medications/allergies/relevent history reviewed, required imaging and test results available.  Performed  Maximum sterile technique was used including antiseptics, cap, gloves, gown, hand hygiene, mask and sheet. Left subclavian vein accessed but wire would not pass. Skin prep: Chlorhexidine; local anesthetic administered A antimicrobial bonded/coated triple lumen catheter was placed in the left internal jugular vein using the Seldinger technique.  Ultrasound was used to verify the patency of the vein and for real time needle guidance.  Evaluation Blood flow good Complications: No apparent complications Patient did tolerate procedure well. Chest X-ray ordered to verify placement.  CXR: pending.  Simonne Maffucci 08/03/2019, 8:31 AM

## 2019-08-03 NOTE — Progress Notes (Signed)
Patients spouse updated on events from this morning.  Spouse voices understanding and questions answered.  E link currently unavailable for video chat, will reach out to family if becomes available.

## 2019-08-03 NOTE — Progress Notes (Signed)
[facility]  Case discussed with Dr. Haroldine Laws Cannulation and initiation of V-V ECMO went well ABG acceptable I wrote orders for mechanical ventilation in ARDS orderset, adjusting RR,TVol, FiO2 and PEEP down given improvement in oxygenation.   Notified [place] and RT.  F/U ABG per ECMO protocol. Continue NMB for now, could stop in AM.  Roselie Awkward, MD [place] [facility] Pager: [phone] Cell: [phone] If no response, call [phone]

## 2019-08-03 NOTE — Consult Note (Signed)
[address]       [place] [address]             [phone]        [facility] Medical Record #[id number] Date of Birth: 29-Jun-1976  Referring: No ref. provider found Primary Care: Tamsen Roers, MD Primary Cardiologist:No primary care provider on file.  Chief Complaint:    Chief Complaint  Patient presents with  . covid pos    History of Present Illness:      43 yo COVID+ man with severe lung complications txferred [facility] to [facility] for v-v ecmo.  He has been known COVID + for nearly a month. Admitted 07/22/19 with increased sputum production, increased cough, and increased SOB. Declined rapidly with ARDS in past 48 hours, requiring intubation and maximized vent settings. Attempted proning earlier today without improved saturations. Consult for ECMO requested through Dr. Haroldine Laws.   Current Activity/ Functional Status: Mobility/Ambulation: intubated, sedated, paralyzed   Zubrod Score: At the time of surgery this patient's most appropriate activity status/level should be described as: []    0    Normal activity, no symptoms []    1    Restricted in physical strenuous activity but ambulatory, able to do out light work []    2    Ambulatory and capable of self care, unable to do work activities, up and about                 more than 50%  Of the time                            []    3    Only limited self care, in bed greater than 50% of waking hours []    4    Completely disabled, no self care, confined to bed or chair []    5    Moribund  Past Medical History:  Diagnosis Date  . Asthma   . GERD (gastroesophageal reflux disease)   . Headache   . History of kidney stones    LEFT URETERAL STONE  . HTN (hypertension)   . Pancreatitis 2018   GALLBALDDER SLUDGE CAUSED ISSUED RESOLVED    Past Surgical History:  Procedure Laterality Date  . CYSTOSCOPY/URETEROSCOPY/HOLMIUM LASER/STENT PLACEMENT Left 04/18/2019   Procedure: LEFT  URETEROSCOPY/HOLMIUM LASER/STENT PLACEMENT;  Surgeon: Ardis Hughs, MD;  Location: [facility];  Service: Urology;  Laterality: Left;  . CYSTOSCOPY/URETEROSCOPY/HOLMIUM LASER/STENT PLACEMENT Left 05/02/2019   Procedure: CYSTOSCOPY/URETEROSCOPY/HOLMIUM LASER/STENT EXCHANGE;  Surgeon: Ardis Hughs, MD;  Location: [facility];  Service: Urology;  Laterality: Left;  . IRRIGATION AND DEBRIDEMENT SHOULDER Right 09/29/2017   Procedure: IRRIGATION AND DEBRIDEMENT SHOULDER;  Surgeon: Leandrew Koyanagi, MD;  Location: [facility];  Service: Orthopedics;  Laterality: Right;  . [place] SURGERY  2002    Social History   Tobacco Use  Smoking Status Never Smoker  Smokeless Tobacco Never Used    Social History   Substance and Sexual Activity  Alcohol Use Yes   Comment: rarely 2-3 times a year     No Known Allergies  Current Facility-Administered Medications  Medication Dose Route Frequency Provider Last Rate Last Admin  . 0.9 %  sodium chloride infusion   Intra-arterial PRN Simonne Maffucci B, MD      . acetaminophen (TYLENOL) tablet 650 mg  650 mg Oral Q6H PRN Pahwani, Rinka  R, MD   650 mg at 08/02/19 1414  . artificial tears (LACRILUBE) ophthalmic ointment 1 application  1 application Both Eyes H8I Ghimire, Kuber, MD      . ascorbic acid (VITAMIN C) tablet 500 mg  500 mg Per Tube Daily Barb Merino, MD   500 mg at 08/03/19 1046  . benzonatate (TESSALON) capsule 100 mg  100 mg Oral TID Barb Merino, MD   100 mg at 08/02/19 2209  . chlorhexidine gluconate (MEDLINE KIT) (PERIDEX) 0.12 % solution 15 mL  15 mL Mouth Rinse BID Simonne Maffucci B, MD   15 mL at 08/03/19 0900  . Chlorhexidine Gluconate Cloth 2 % PADS 6 each  6 each Topical Daily Barb Merino, MD   6 each at 08/03/19 0800  . cisatracurium (NIMBEX) 200 mg in sodium chloride 0.9 % 200 mL (1 mg/mL) infusion  0-10 mcg/kg/min Intravenous Titrated Barb Merino, MD      . dexamethasone (DECADRON) injection 6 mg  6 mg  Intravenous Q24H Charlynne Cousins, MD   6 mg at 08/03/19 1126  . feeding supplement (PRO-STAT SUGAR FREE 64) liquid 30 mL  30 mL Per Tube BID Simonne Maffucci B, MD   30 mL at 08/03/19 1046  . feeding supplement (VITAL HIGH PROTEIN) liquid 1,000 mL  1,000 mL Per Tube Q24H Simonne Maffucci B, MD   1,000 mL at 08/03/19 1500  . fentaNYL (SUBLIMAZE) bolus via infusion 50 mcg  50 mcg Intravenous Q15 min PRN Peyton Bottoms, MD      . fentaNYL (SUBLIMAZE) bolus via infusion 50 mcg  50 mcg Intravenous Q15 min PRN Simonne Maffucci B, MD      . fentaNYL (SUBLIMAZE) bolus via infusion 50 mcg  50 mcg Intravenous Q15 min PRN Barb Merino, MD      . fentaNYL (SUBLIMAZE) injection 50 mcg  50 mcg Intravenous Once Simonne Maffucci B, MD      . fentaNYL (SUBLIMAZE) injection 50 mcg  50 mcg Intravenous Once Barb Merino, MD      . fentaNYL 2553mg in NS 2530m(1064mml) infusion-PREMIX  50-300 mcg/hr Intravenous Continuous McQSimonne Maffucci MD 27.5 mL/hr at 08/03/19 1747 275 mcg/hr at 08/03/19 1747  . guaiFENesin-dextromethorphan (ROBITUSSIN DM) 100-10 MG/5ML syrup 10 mL  10 mL Oral Q4H PRN Pahwani, Rinka R, MD   10 mL at 08/01/19 2147  . heparin ADULT infusion 100 units/mL (25000 units/250m46mdium chloride 0.45%)  1,500 Units/hr Intravenous Continuous GhimBarb Merino      . insulin aspart (novoLOG) injection 0-15 Units  0-15 Units Subcutaneous TID WC FeliCharlynne Cousins   2 Units at 08/03/19 1305  . insulin aspart (novoLOG) injection 0-5 Units  0-5 Units Subcutaneous QHS FeliCharlynne Cousins   2 Units at 07/28/19 2144  . insulin detemir (LEVEMIR) injection 20 Units  20 Units Subcutaneous BID FeliCharlynne Cousins   20 Units at 08/03/19 1000  . MEDLINE mouth rinse  15 mL Mouth Rinse BID FeliCharlynne Cousins   15 mL at 08/02/19 2150  . MEDLINE mouth rinse  15 mL Mouth Rinse 10 times per day McQuSimonne MaffucciMD   15 mL at 08/03/19 1400  . Melatonin TABS 5 mg  5 mg Oral QHS PRN [employer]   5 mg at 08/02/19 2219  . metoprolol tartrate (LOPRESSOR) injection 5 mg  5 mg Intravenous Q6H PRN FeliCharlynne Cousins      . metoprolol tartrate (LOPRESSOR) tablet 75  mg  75 mg Oral BID Charlynne Cousins, MD   75 mg at 08/02/19 2210  . midazolam (VERSED) 50 mg/50 mL (1 mg/mL) premix infusion  1-10 mg/hr Intravenous Continuous Simonne Maffucci B, MD 10 mL/hr at 08/03/19 1425 10 mg/hr at 08/03/19 1425  . midazolam (VERSED) bolus via infusion 1-2 mg  1-2 mg Intravenous Q2H PRN Simonne Maffucci B, MD      . midazolam (VERSED) bolus via infusion 1-2 mg  1-2 mg Intravenous Q2H PRN Barb Merino, MD      . morphine 2 MG/ML injection 2 mg  2 mg Intravenous Q6H PRN Barb Merino, MD   2 mg at 08/03/19 0453  . norepinephrine (LEVOPHED) 35m in 2527mpremix infusion  0-40 mcg/min Intravenous Titrated McJuanito DoomMD   Stopped at 08/03/19 1525  . ondansetron (ZOFRAN) tablet 4 mg  4 mg Oral Q6H PRN Pahwani, Rinka R, MD       Or  . ondansetron (ZOFRAN) injection 4 mg  4 mg Intravenous Q6H PRN Pahwani, Rinka R, MD   4 mg at 07/29/19 1037  . pantoprazole (PROTONIX) injection 40 mg  40 mg Intravenous Daily VePeyton BottomsMD   40 mg at 08/03/19 1046  . sodium chloride (OCEAN) 0.65 % nasal spray 1 spray  1 spray Each Nare PRN GhBarb MerinoMD      . zinc sulfate capsule 220 mg  220 mg Per Tube Daily GhBarb MerinoMD   220 mg at 08/03/19 1046    Medications Prior to Admission  Medication Sig Dispense Refill Last Dose  . Albuterol Sulfate (PROAIR RESPICLICK) 1010290 Base) MCG/ACT AEPB Inhale 2 puffs into the lungs every 4 (four) hours as needed (Shortness of breath). 1 each 1 07/22/2019 at Unknown time  . budesonide-formoterol (SYMBICORT) 160-4.5 MCG/ACT inhaler Inhale 2 puffs into the lungs daily as needed (cough or wheeze). Inhale two puffs ONCE daily to prevent cough or wheeze. Rinse, gargle, and spit after use. (Patient taking differently: Inhale 2 puffs into the lungs daily as  needed (cough or wheeze). Rinse, gargle and spit after use.) 10.2 g 5 07/21/2019 at Unknown time  . cetirizine (ZYRTEC) 10 MG tablet Take 1 tablet (10 mg total) by mouth daily. 30 tablet 5 07/22/2019 at Unknown time  . lisinopril-hydrochlorothiazide (ZESTORETIC) 10-12.5 MG tablet Take 1 tablet by mouth daily.   07/21/2019 at Unknown time  . omeprazole (PRILOSEC) 20 MG capsule Take 40 mg by mouth daily.    07/22/2019 at Unknown time  . triamcinolone (NASACORT ALLERGY 24HR) 55 MCG/ACT AERO nasal inhaler Place 2 sprays into the nose daily. (Patient taking differently: Place 2 sprays into the nose daily as needed (allergies). ) 1 Inhaler 5 07/21/2019 at Unknown time    Family History  Problem Relation Age of Onset  . Asthma Brother   . Diabetes Father   . Hypertension Father   . Diabetes Paternal Grandmother   . Hypertension Paternal Grandmother   . Migraines Mother   . GER disease Mother   . Pancreatic cancer Maternal Grandmother   . Heart disease Maternal Grandfather      Review of Systems:   ROS Review of systems not obtained due to patient factors.     Cardiac Review of Systems: Y or  [    ]= no  Chest Pain [    ]  Resting SOB [   ] Exertional SOB  [  ]  Orthopnea [  ]   Pedal Edema [   ]  Palpitations [  ] Syncope  [  ]   Presyncope [   ]  General Review of Systems: [Y] = yes [  ]=no Constitional: recent weight change [  ]; anorexia [  ]; fatigue [  ]; nausea [  ]; night sweats [  ]; fever [  ]; or chills [  ]                                                               Dental: Last Dentist visit:   Eye : blurred vision [  ]; diplopia [   ]; vision changes [  ];  Amaurosis fugax[  ]; Resp: cough [  ];  wheezing[  ];  hemoptysis[  ]; shortness of breath[  ]; paroxysmal nocturnal dyspnea[  ]; dyspnea on exertion[  ]; or orthopnea[  ];  GI:  gallstones[  ], vomiting[  ];  dysphagia[  ]; melena[  ];  hematochezia [  ]; heartburn[  ];   Hx of  Colonoscopy[  ]; GU: kidney stones [   ]; hematuria[  ];   dysuria [  ];  nocturia[  ];  history of     obstruction [  ]; urinary frequency [  ]             Skin: rash, swelling[  ];, hair loss[  ];  peripheral edema[  ];  or itching[  ]; Musculosketetal: myalgias[  ];  joint swelling[  ];  joint erythema[  ];  joint pain[  ];  back pain[  ];  Heme/Lymph: bruising[  ];  bleeding[  ];  anemia[  ];  Neuro: TIA[  ];  headaches[  ];  stroke[  ];  vertigo[  ];  seizures[  ];   paresthesias[  ];  difficulty walking[  ];  Psych:depression[  ]; anxiety[  ];  Endocrine: diabetes[  ];  thyroid dysfunction[  ];          Physical Exam: BP 101/60   Pulse (!) 111   Temp 99.3 F (37.4 C) (Axillary)   Resp 18   Ht 5' 8" (1.727 m)   Wt (!) 136.1 kg   SpO2 99%   BMI 45.62 kg/m   Examination is performed in the cath lab immediately upon arrival on txfer General appearance: intubated/sedated Head: Normocephalic, without obvious abnormality, atraumatic Resp: decreased BS; O2 sat 84% on PEEP 19 and FiO2 100% Cardio: regular rate and rhythm, S1, S2 normal, no murmur, click, rub or gallop GI: soft, non-tender; bowel sounds normal; no masses,  no organomegaly Extremities: cyanotic nail beds Neurologic: Motor: moves to stimulus on arrival followed by paralysis  Diagnostic Studies & Laboratory data:     Recent Radiology Findings:   DG Abd 1 View  Result Date: 08/03/2019 CLINICAL DATA:  NG tube placement EXAM: ABDOMEN - 1 VIEW COMPARISON:  None. FINDINGS: 0948 hours. NG tube tip is in the distal stomach near the pylorus. Bowel gas pattern is nonobstructive. Visualized bony anatomy unremarkable. Subcutaneous emphysema noted lower right chest wall. IMPRESSION: NG tube tip is positioned in the distal stomach. Electronically Signed   By: Misty Stanley M.D.   On: 08/03/2019 10:27   CARDIAC CATHETERIZATION  Result Date: 08/03/2019 Successful bifemoral VV ECMO cannulation.   DG Chest  Port 1 View  Result Date: 08/03/2019 CLINICAL DATA:   COVID-19 positive. ARDS. EXAM: PORTABLE CHEST 1 VIEW COMPARISON:  August 03, 2019 FINDINGS: Extensive subcutaneous emphysema is identified over bilateral chest. Bilateral chest tubes, endotracheal tube are identified. Feeding tube is identified with distal tip not included on film but is at least in the stomach. Diffuse patchy consolidation of bilateral lungs are identified unchanged. IMPRESSION: 1. Extensive subcutaneous emphysema is identified over bilateral chest. 2. Bilateral chest tubes are identified. 3. Diffuse patchy consolidation of bilateral lungs are identified unchanged. Electronically Signed   By: Abelardo Diesel M.D.   On: 08/03/2019 10:37   DG CHEST PORT 1 VIEW  Result Date: 08/03/2019 CLINICAL DATA:  Intubation. EXAM: PORTABLE CHEST 1 VIEW COMPARISON:  08/02/2019 FINDINGS: Endotracheal tube has tip 8.4 cm above the carina. Lungs are hypoinflated demonstrate a stable small left-sided pneumothorax. Previously seen small right apical pneumothorax is not well visualized on the current exam. Persistent moderate bilateral airspace opacification without significant change. Cardiomediastinal silhouette is within normal. Persistent moderate subcutaneous emphysema over the neck and chest. IMPRESSION: 1. Moderate bilateral persistent airspace process without significant change likely multifocal infection. Stable subcutaneous emphysema over the neck and chest. 2. Previously seen small right apical pneumothorax not visualized on the current exam. Stable small left pneumothorax. 3.  Endotracheal tube with tip 8.4 cm above the carina. Electronically Signed   By: Marin Olp M.D.   On: 08/03/2019 08:02   DG CHEST PORT 1 VIEW  Result Date: 08/02/2019 CLINICAL DATA:  Bilateral pneumothoraces. EXAM: PORTABLE CHEST 1 VIEW COMPARISON:  08/01/2019 FINDINGS: Examination demonstrates small bilateral pneumothoraces without significant change. Moderate bilateral airspace opacification without significant change.  Cardiomediastinal silhouette is within normal. Moderate subcutaneous emphysema over the neck and chest unchanged. IMPRESSION: 1. Persistent moderate bilateral multifocal airspace process likely multifocal pneumonia. 2. Stable small bilateral pneumothoraces. Subcutaneous emphysema over the neck and chest unchanged. Electronically Signed   By: Marin Olp M.D.   On: 08/02/2019 09:26     I have independently reviewed the above radiologic studies and discussed with the patient   Recent Lab Findings: Lab Results  Component Value Date   WBC 15.2 (H) 08/02/2019   HGB 18.0 (H) 08/03/2019   HCT 53.0 (H) 08/03/2019   PLT 185 08/02/2019   GLUCOSE 94 08/03/2019   CHOL 148 04/18/2017   TRIG 809 (H) 08/03/2019   HDL 41 04/18/2017   LDLCALC 95 04/18/2017   ALT 29 08/03/2019   AST 42 (H) 08/03/2019   NA 135 08/03/2019   K 5.7 (H) 08/03/2019   CL 102 08/03/2019   CREATININE 0.88 08/03/2019   BUN 36 (H) 08/03/2019   CO2 25 08/03/2019   HGBA1C 6.7 (H) 07/24/2019      Assessment / Plan:     Urgent institution of V-V ecmo      I  spent 30 minutes counseling the patient face to face.   Esther Broyles [name], [place] 08/03/2019 8:29 PM

## 2019-08-03 NOTE — Progress Notes (Signed)
Patient's ETT re-secured with cloth tape at 28 cm.  Patient placed in prone position by RT x 3 and RN x 3 without complications. Patient's head turned to the left.

## 2019-08-03 NOTE — Procedures (Signed)
Chest Tube Insertion Procedure Note  Indications:  Clinically significant Pneumothorax  Pre-operative Diagnosis: Pneumothorax  Post-operative Diagnosis: Pneumothorax  Procedure Details  Informed consent was obtained for the procedure, including sedation.  Risks of lung perforation, hemorrhage, arrhythmia, and adverse drug reaction were discussed.   After sterile skin prep, using standard technique, a 28 French tube was placed in the right lateral 6th rib space.  Findings: None  Estimated Blood Loss:  Minimal         Specimens: not sent              Complications:  None; patient tolerated the procedure well.         Disposition: ICU - intubated and critically ill.         Condition: stable  Attending Attestation: I performed the procedure.  Roselie Awkward, MD [facility] Pager: [phone] Cell: [phone] If no response, call [phone]

## 2019-08-03 NOTE — Progress Notes (Addendum)
NAME:  [name], MRN:  [id number], DOB:  [birth year], LOS: 12 ADMISSION DATE:  07/22/2019, CONSULTATION DATE:  12/24 REFERRING MD:  Sloan Leiter, CHIEF COMPLAINT:  Dyspnea   Brief History   [name] with PMH of GERD and Asthma admitted 12/14 with COVID pneumonia causing acute hypoxemic respiratory failure.   Developed pneumomediastinum 12/23 with concern for possible bilateral small pneumothoraces on 12/24.  PCCM consulted for evaluation of pneumothoraces.  Pt was transferred to the ICU and intubated on 12/26, developed bilateral pneumothoraces. Despite ventilator support, paralytics and proning, the patient continued to de-saturate and the decision was made to transfer to [facility] for v/v ECMO.    Past Medical History  GERD HTN Asthma  Significant Hospital Events   12/14 admit with hypoxemic respiratory failure in setting of COVID-19 pneumonia 12/23 noted to have pneumomediastinum on chest x-ray 12/24 PCCM consulted for evaluation 12/26 worsening hypoxemia, intubated    Consults:  PCCM Heart Failure  Procedures:  Bilateral Chest tubes 12/26- L IJ CVC 12/26- L Radial Art line 12/26- ETT 12/26-  Significant Diagnostic Tests:  CT chest 12/22 >> extensive pneumomediastinum, multifocal patchy bilateral groundglass opacitiesCT chest 12/22 >> extensive pneumomediastinum, multifocal patchy bilateral groundglass opacities  Micro Data:  BCx2 12/14 >> negative  Sputum 12/15 >> negative   Antimicrobials:  Received remdesivir, steroids, Actemra and convalescent plasma  Cefepime 12/26-  Interim history/subjective:    Objective   Blood pressure 129/81, pulse (!) 140, temperature 99.3 F (37.4 C), temperature source Axillary, resp. rate (!) 32, height 5' 8"  (1.727 m), weight (!) 136.1 kg, SpO2 (!) 84 %.    Vent Mode: PRVC FiO2 (%):  [100 %] 100 % Set Rate:  [24 bmp-32 bmp] 32 bmp Vt Set:  [410 mL-460 mL] 460 mL PEEP:  [15 cmH20-18 cmH20] 18 cmH20 Plateau Pressure:  [30  XKG81-85 cmH20] 41 cmH20   Intake/Output Summary (Last 24 hours) at 08/03/2019 1942 Last data filed at 08/03/2019 1800 Gross per 24 hour  Intake 1195.05 ml  Output 1766 ml  Net -570.95 ml   Filed Weights   07/22/19 1300 08/03/19 1900  Weight: 136.1 kg (!) 136.1 kg    General:  Obese, sedated and intubated M HEENT: MM pink/moist Neuro: sedated and paralyzed CV: s1s2 rrr, no m/r/g PULM:  On full vent support, ETT tube in place, lungs clear GI: soft, bsx4 active  Extremities: warm/dry, no edema  Skin: no rashes or lesions        Resolved Hospital Problem list     Assessment & Plan:    Acute Hypoxemic Respiratory Failure secondary to COVID with pneumomediastinum and bilateral pneumothoraces -worsening despite intubation, proning, decadron, bilateral chest tubes  -transferred to [facility] and taken to the cath lab for v/v ECMO access P: -plan of care discussed with PCCM ECMO team member Dr. Lynetta Mare, change vent settings to lung protective PC 20/10, RR 20, monitor plateau pressures, expect some lower oxygen saturations overnight, tolerate low TV, ABG improved -Goal Hgb 9-11, serial blood counts and transfuse as needed overnight -Goal ACT 180-200 -IVF as needed to maintain adequate inflow  -Continue Nimbex and sedation with Fentanyl and Versed -CVTS and Cardiology also following along  -finish 10 day course of Decadron, completed Remdesevir, Actemra and convalescent Plasma -VAP prevention protocol -ECMO prophylactic Cefepime initiated -RASS target -2 to -3      Best practice:   Feeding: start tube feeding Analgesia: fentanyl infusion Sedation: propofol infusion, prn versed Thromboprophylaxis: enoxaparin HOB >30 degrees Ulcer prophylaxis:  pantoprazole Glucose control: SSI   Labs   CBC: Recent Labs  Lab 08/01/19 0300 08/02/19 0655 08/03/19 0946 08/03/19 1542  WBC 10.3 15.2*  --   --   NEUTROABS  --  11.7*  --   --   HGB 15.9 16.4 18.0* 18.0*  HCT 49.5 50.4  53.0* 53.0*  MCV 82.8 84.3  --   --   PLT 186 185  --   --     Basic Metabolic Panel: Recent Labs  Lab 07/29/19 0500 07/30/19 0300 07/31/19 0300 08/02/19 0655 08/03/19 0200 08/03/19 0946 08/03/19 1040 08/03/19 1542  NA 138 138 136 137 138 137  --  135  K 4.1 4.0 4.6 4.0 4.1 4.7  --  5.7*  CL 101 100 101 100 102  --   --   --   CO2 23 24 25 24 25   --   --   --   GLUCOSE 115* 98 87 84 94  --   --   --   BUN 33* 32* 31* 35* 36*  --   --   --   CREATININE 0.74 0.80 0.83 0.91 0.88  --   --   --   CALCIUM 8.8* 8.7* 8.5* 8.6* 8.8*  --   --   --   MG  --   --   --  2.1  --   --  2.4  --   PHOS  --   --   --  4.3  --   --  7.1*  --    GFR: Estimated Creatinine Clearance: 143.2 mL/min (by C-G formula based on SCr of 0.88 mg/dL). Recent Labs  Lab 07/29/19 0500 07/30/19 0300 07/31/19 0300 08/01/19 0300 08/02/19 0655  PROCALCITON <0.10 <0.10 <0.10  --   --   WBC  --   --   --  10.3 15.2*    Liver Function Tests: Recent Labs  Lab 08/02/19 0655 08/03/19 0200  AST 31 42*  ALT 25 29  ALKPHOS 75 104  BILITOT 0.9 1.2  PROT 6.2* 6.7  ALBUMIN 3.2* 3.4*   No results for input(s): LIPASE, AMYLASE in the last 168 hours. No results for input(s): AMMONIA in the last 168 hours.  ABG    Component Value Date/Time   PHART 7.167 (LL) 08/03/2019 1542   PCO2ART 79.9 (HH) 08/03/2019 1542   PO2ART 52.0 (L) 08/03/2019 1542   HCO3 28.9 (H) 08/03/2019 1542   TCO2 31 08/03/2019 1542   ACIDBASEDEF 3.0 (H) 08/03/2019 1542   O2SAT 74.0 08/03/2019 1542     Coagulation Profile: No results for input(s): INR, PROTIME in the last 168 hours.  Cardiac Enzymes: No results for input(s): CKTOTAL, CKMB, CKMBINDEX, TROPONINI in the last 168 hours.  HbA1C: Hgb A1c MFr Bld  Date/Time Value Ref Range Status  07/24/2019 04:50 AM 6.7 (H) 4.8 - 5.6 % Final    Comment:    (NOTE) Pre diabetes:          5.7%-6.4% Diabetes:              >6.4% Glycemic control for   <7.0% adults with diabetes    09/30/2017 05:36 AM 5.7 (H) 4.8 - 5.6 % Final    Comment:    (NOTE)         Prediabetes: 5.7 - 6.4         Diabetes: >6.4         Glycemic control for adults with diabetes: <7.0  CBG: Recent Labs  Lab 08/01/19 2141 08/02/19 0930 08/02/19 1216 08/02/19 2207 08/03/19 1218  GLUCAP 113* 124* 120* 109* 138*     Critical care time: 55 minutes      CRITICAL CARE Performed by: Otilio Carpen Kaileah Shevchenko   Total critical care time: 55 minutes  Critical care time was exclusive of separately billable procedures and treating other patients.  Critical care was necessary to treat or prevent imminent or life-threatening deterioration.  Critical care was time spent personally by me on the following activities: development of treatment plan with patient and/or surrogate as well as nursing, discussions with consultants, evaluation of patient's response to treatment, examination of patient, obtaining history from patient or surrogate, ordering and performing treatments and interventions, ordering and review of laboratory studies, ordering and review of radiographic studies, pulse oximetry and re-evaluation of patient's condition.   Otilio Carpen Josi Roediger, PA-C [facility]  Pager# [phone], if no answer [phone]

## 2019-08-03 NOTE — Progress Notes (Addendum)
Preparing pt for intubation. Anesthesia at bedside.

## 2019-08-03 NOTE — Procedures (Signed)
Arterial Catheter Insertion Procedure Note [name] [id number] 1975/11/14  Procedure: Insertion of Arterial Catheter  Indications: Blood pressure monitoring  Procedure Details Consent: Unable to obtain consent because of emergent medical necessity. Time Out: Verified patient identification, verified procedure, site/side was marked, verified correct patient position, special equipment/implants available, medications/allergies/relevent history reviewed, required imaging and test results available.  Performed  Maximum sterile technique was used including antiseptics, cap, gloves, gown, hand hygiene, mask and sheet. Skin prep: Chlorhexidine; local anesthetic administered 20 gauge catheter was inserted into left radial artery using the Seldinger technique. ULTRASOUND GUIDANCE USED: NO Evaluation Blood flow good; BP tracing good. Complications: No apparent complications.   [name] 08/03/2019

## 2019-08-04 ENCOUNTER — Inpatient Hospital Stay (HOSPITAL_COMMUNITY): Payer: Managed Care, Other (non HMO)

## 2019-08-04 DIAGNOSIS — J189 Pneumonia, unspecified organism: Secondary | ICD-10-CM

## 2019-08-04 LAB — POCT I-STAT 7, (LYTES, BLD GAS, ICA,H+H)
Acid-Base Excess: 2 mmol/L (ref 0.0–2.0)
Acid-base deficit: 3 mmol/L — ABNORMAL HIGH (ref 0.0–2.0)
Acid-base deficit: 4 mmol/L — ABNORMAL HIGH (ref 0.0–2.0)
Acid-base deficit: 2 mmol/L (ref 0.0–2.0)
Acid-base deficit: 2 mmol/L (ref 0.0–2.0)
Acid-base deficit: 3 mmol/L — ABNORMAL HIGH (ref 0.0–2.0)
Acid-base deficit: 2 mmol/L (ref 0.0–2.0)
Acid-base deficit: 1 mmol/L (ref 0.0–2.0)
Acid-base deficit: 2 mmol/L (ref 0.0–2.0)
Acid-base deficit: 2 mmol/L (ref 0.0–2.0)
Bicarbonate: 21.6 mmol/L (ref 20.0–28.0)
Bicarbonate: 21.9 mmol/L (ref 20.0–28.0)
Bicarbonate: 23.8 mmol/L (ref 20.0–28.0)
Bicarbonate: 24.1 mmol/L (ref 20.0–28.0)
Bicarbonate: 24.2 mmol/L (ref 20.0–28.0)
Bicarbonate: 23.4 mmol/L (ref 20.0–28.0)
Bicarbonate: 24.8 mmol/L (ref 20.0–28.0)
Bicarbonate: 24.4 mmol/L (ref 20.0–28.0)
Bicarbonate: 23.8 mmol/L (ref 20.0–28.0)
Bicarbonate: 25.2 mmol/L (ref 20.0–28.0)
Calcium, Ion: 1.1 mmol/L — ABNORMAL LOW (ref 1.15–1.40)
Calcium, Ion: 1.12 mmol/L — ABNORMAL LOW (ref 1.15–1.40)
Calcium, Ion: 1.18 mmol/L (ref 1.15–1.40)
Calcium, Ion: 1.22 mmol/L (ref 1.15–1.40)
Calcium, Ion: 1.19 mmol/L (ref 1.15–1.40)
Calcium, Ion: 1.15 mmol/L (ref 1.15–1.40)
Calcium, Ion: 1.18 mmol/L (ref 1.15–1.40)
Calcium, Ion: 1.19 mmol/L (ref 1.15–1.40)
Calcium, Ion: 1.17 mmol/L (ref 1.15–1.40)
Calcium, Ion: 1.04 mmol/L — ABNORMAL LOW (ref 1.15–1.40)
HCT: 35 % — ABNORMAL LOW (ref 39.0–52.0)
HCT: 39 % (ref 39.0–52.0)
HCT: 33 % — ABNORMAL LOW (ref 39.0–52.0)
HCT: 35 % — ABNORMAL LOW (ref 39.0–52.0)
HCT: 36 % — ABNORMAL LOW (ref 39.0–52.0)
HCT: 34 % — ABNORMAL LOW (ref 39.0–52.0)
HCT: 34 % — ABNORMAL LOW (ref 39.0–52.0)
HCT: 35 % — ABNORMAL LOW (ref 39.0–52.0)
HCT: 34 % — ABNORMAL LOW (ref 39.0–52.0)
HCT: 35 % — ABNORMAL LOW (ref 39.0–52.0)
Hemoglobin: 11.9 g/dL — ABNORMAL LOW (ref 13.0–17.0)
Hemoglobin: 13.3 g/dL (ref 13.0–17.0)
Hemoglobin: 11.2 g/dL — ABNORMAL LOW (ref 13.0–17.0)
Hemoglobin: 11.9 g/dL — ABNORMAL LOW (ref 13.0–17.0)
Hemoglobin: 12.2 g/dL — ABNORMAL LOW (ref 13.0–17.0)
Hemoglobin: 11.6 g/dL — ABNORMAL LOW (ref 13.0–17.0)
Hemoglobin: 11.6 g/dL — ABNORMAL LOW (ref 13.0–17.0)
Hemoglobin: 11.9 g/dL — ABNORMAL LOW (ref 13.0–17.0)
Hemoglobin: 11.6 g/dL — ABNORMAL LOW (ref 13.0–17.0)
Hemoglobin: 11.9 g/dL — ABNORMAL LOW (ref 13.0–17.0)
O2 Saturation: 95 %
O2 Saturation: 97 %
O2 Saturation: 90 %
O2 Saturation: 92 %
O2 Saturation: 92 %
O2 Saturation: 96 %
O2 Saturation: 95 %
O2 Saturation: 93 %
O2 Saturation: 95 %
O2 Saturation: 95 %
Patient temperature: 37.7
Patient temperature: 37.8
Patient temperature: 37.6
Patient temperature: 37.6
Patient temperature: 36.9
Patient temperature: 97.9
Patient temperature: 97.9
Patient temperature: 98.5
Patient temperature: 98.1
Patient temperature: 98.7
Potassium: 4.3 mmol/L (ref 3.5–5.1)
Potassium: 4.4 mmol/L (ref 3.5–5.1)
Potassium: 4.2 mmol/L (ref 3.5–5.1)
Potassium: 4.3 mmol/L (ref 3.5–5.1)
Potassium: 4.1 mmol/L (ref 3.5–5.1)
Potassium: 4 mmol/L (ref 3.5–5.1)
Potassium: 4.6 mmol/L (ref 3.5–5.1)
Potassium: 4.7 mmol/L (ref 3.5–5.1)
Potassium: 4.6 mmol/L (ref 3.5–5.1)
Potassium: 4.9 mmol/L (ref 3.5–5.1)
Sodium: 139 mmol/L (ref 135–145)
Sodium: 141 mmol/L (ref 135–145)
Sodium: 138 mmol/L (ref 135–145)
Sodium: 140 mmol/L (ref 135–145)
Sodium: 141 mmol/L (ref 135–145)
Sodium: 141 mmol/L (ref 135–145)
Sodium: 139 mmol/L (ref 135–145)
Sodium: 140 mmol/L (ref 135–145)
Sodium: 140 mmol/L (ref 135–145)
Sodium: 144 mmol/L (ref 135–145)
TCO2: 23 mmol/L (ref 22–32)
TCO2: 23 mmol/L (ref 22–32)
TCO2: 25 mmol/L (ref 22–32)
TCO2: 26 mmol/L (ref 22–32)
TCO2: 26 mmol/L (ref 22–32)
TCO2: 25 mmol/L (ref 22–32)
TCO2: 26 mmol/L (ref 22–32)
TCO2: 26 mmol/L (ref 22–32)
TCO2: 25 mmol/L (ref 22–32)
TCO2: 26 mmol/L (ref 22–32)
pCO2 arterial: 38.7 mmHg (ref 32.0–48.0)
pCO2 arterial: 41 mmHg (ref 32.0–48.0)
pCO2 arterial: 44.7 mmHg (ref 32.0–48.0)
pCO2 arterial: 48.9 mmHg — ABNORMAL HIGH (ref 32.0–48.0)
pCO2 arterial: 49.5 mmHg — ABNORMAL HIGH (ref 32.0–48.0)
pCO2 arterial: 41.4 mmHg (ref 32.0–48.0)
pCO2 arterial: 46 mmHg (ref 32.0–48.0)
pCO2 arterial: 47.5 mmHg (ref 32.0–48.0)
pCO2 arterial: 43.6 mmHg (ref 32.0–48.0)
pCO2 arterial: 34.1 mmHg (ref 32.0–48.0)
pH, Arterial: 7.338 — ABNORMAL LOW (ref 7.350–7.450)
pH, Arterial: 7.359 (ref 7.350–7.450)
pH, Arterial: 7.304 — ABNORMAL LOW (ref 7.350–7.450)
pH, Arterial: 7.337 — ABNORMAL LOW (ref 7.350–7.450)
pH, Arterial: 7.297 — ABNORMAL LOW (ref 7.350–7.450)
pH, Arterial: 7.359 (ref 7.350–7.450)
pH, Arterial: 7.338 — ABNORMAL LOW (ref 7.350–7.450)
pH, Arterial: 7.318 — ABNORMAL LOW (ref 7.350–7.450)
pH, Arterial: 7.344 — ABNORMAL LOW (ref 7.350–7.450)
pH, Arterial: 7.476 — ABNORMAL HIGH (ref 7.350–7.450)
pO2, Arterial: 102 mmHg (ref 83.0–108.0)
pO2, Arterial: 86 mmHg (ref 83.0–108.0)
pO2, Arterial: 67 mmHg — ABNORMAL LOW (ref 83.0–108.0)
pO2, Arterial: 71 mmHg — ABNORMAL LOW (ref 83.0–108.0)
pO2, Arterial: 71 mmHg — ABNORMAL LOW (ref 83.0–108.0)
pO2, Arterial: 83 mmHg (ref 83.0–108.0)
pO2, Arterial: 77 mmHg — ABNORMAL LOW (ref 83.0–108.0)
pO2, Arterial: 72 mmHg — ABNORMAL LOW (ref 83.0–108.0)
pO2, Arterial: 80 mmHg — ABNORMAL LOW (ref 83.0–108.0)
pO2, Arterial: 68 mmHg — ABNORMAL LOW (ref 83.0–108.0)

## 2019-08-04 LAB — CBC
HCT: 31.1 % — ABNORMAL LOW (ref 39.0–52.0)
HCT: 36.9 % — ABNORMAL LOW (ref 39.0–52.0)
HCT: 37.7 % — ABNORMAL LOW (ref 39.0–52.0)
HCT: 38 % — ABNORMAL LOW (ref 39.0–52.0)
HCT: 38.2 % — ABNORMAL LOW (ref 39.0–52.0)
Hemoglobin: 11.8 g/dL — ABNORMAL LOW (ref 13.0–17.0)
Hemoglobin: 11.9 g/dL — ABNORMAL LOW (ref 13.0–17.0)
Hemoglobin: 12 g/dL — ABNORMAL LOW (ref 13.0–17.0)
Hemoglobin: 12.6 g/dL — ABNORMAL LOW (ref 13.0–17.0)
Hemoglobin: 9.8 g/dL — ABNORMAL LOW (ref 13.0–17.0)
MCH: 27.4 pg (ref 26.0–34.0)
MCH: 27.6 pg (ref 26.0–34.0)
MCH: 27.8 pg (ref 26.0–34.0)
MCH: 28 pg (ref 26.0–34.0)
MCH: 28.4 pg (ref 26.0–34.0)
MCHC: 31.5 g/dL (ref 30.0–36.0)
MCHC: 31.6 g/dL (ref 30.0–36.0)
MCHC: 31.6 g/dL (ref 30.0–36.0)
MCHC: 32 g/dL (ref 30.0–36.0)
MCHC: 33 g/dL (ref 30.0–36.0)
MCV: 86 fL (ref 80.0–100.0)
MCV: 86.4 fL (ref 80.0–100.0)
MCV: 86.9 fL (ref 80.0–100.0)
MCV: 88.1 fL (ref 80.0–100.0)
MCV: 88.6 fL (ref 80.0–100.0)
Platelets: 74 10*3/uL — ABNORMAL LOW (ref 150–400)
Platelets: 76 10*3/uL — ABNORMAL LOW (ref 150–400)
Platelets: 82 10*3/uL — ABNORMAL LOW (ref 150–400)
Platelets: 91 10*3/uL — ABNORMAL LOW (ref 150–400)
Platelets: 94 10*3/uL — ABNORMAL LOW (ref 150–400)
RBC: 3.53 MIL/uL — ABNORMAL LOW (ref 4.22–5.81)
RBC: 4.27 MIL/uL (ref 4.22–5.81)
RBC: 4.29 MIL/uL (ref 4.22–5.81)
RBC: 4.34 MIL/uL (ref 4.22–5.81)
RBC: 4.44 MIL/uL (ref 4.22–5.81)
RDW: 13.2 % (ref 11.5–15.5)
RDW: 13.2 % (ref 11.5–15.5)
RDW: 13.2 % (ref 11.5–15.5)
RDW: 13.5 % (ref 11.5–15.5)
RDW: 13.8 % (ref 11.5–15.5)
WBC: 13.3 10*3/uL — ABNORMAL HIGH (ref 4.0–10.5)
WBC: 14.2 10*3/uL — ABNORMAL HIGH (ref 4.0–10.5)
WBC: 16 10*3/uL — ABNORMAL HIGH (ref 4.0–10.5)
WBC: 16.8 10*3/uL — ABNORMAL HIGH (ref 4.0–10.5)
WBC: 17.6 10*3/uL — ABNORMAL HIGH (ref 4.0–10.5)
nRBC: 0 % (ref 0.0–0.2)
nRBC: 0 % (ref 0.0–0.2)
nRBC: 0 % (ref 0.0–0.2)
nRBC: 0 % (ref 0.0–0.2)
nRBC: 0 % (ref 0.0–0.2)

## 2019-08-04 LAB — GLUCOSE, CAPILLARY
Glucose-Capillary: 103 mg/dL — ABNORMAL HIGH (ref 70–99)
Glucose-Capillary: 110 mg/dL — ABNORMAL HIGH (ref 70–99)
Glucose-Capillary: 112 mg/dL — ABNORMAL HIGH (ref 70–99)
Glucose-Capillary: 114 mg/dL — ABNORMAL HIGH (ref 70–99)
Glucose-Capillary: 125 mg/dL — ABNORMAL HIGH (ref 70–99)
Glucose-Capillary: 127 mg/dL — ABNORMAL HIGH (ref 70–99)
Glucose-Capillary: 139 mg/dL — ABNORMAL HIGH (ref 70–99)
Glucose-Capillary: 152 mg/dL — ABNORMAL HIGH (ref 70–99)
Glucose-Capillary: 152 mg/dL — ABNORMAL HIGH (ref 70–99)
Glucose-Capillary: 155 mg/dL — ABNORMAL HIGH (ref 70–99)

## 2019-08-04 LAB — BASIC METABOLIC PANEL
Anion gap: 7 (ref 5–15)
Anion gap: 4 — ABNORMAL LOW (ref 5–15)
BUN: 35 mg/dL — ABNORMAL HIGH (ref 6–20)
BUN: 39 mg/dL — ABNORMAL HIGH (ref 6–20)
CO2: 23 mmol/L (ref 22–32)
CO2: 24 mmol/L (ref 22–32)
Calcium: 7.6 mg/dL — ABNORMAL LOW (ref 8.9–10.3)
Calcium: 7.5 mg/dL — ABNORMAL LOW (ref 8.9–10.3)
Chloride: 108 mmol/L (ref 98–111)
Chloride: 108 mmol/L (ref 98–111)
Creatinine, Ser: 0.77 mg/dL (ref 0.61–1.24)
Creatinine, Ser: 0.74 mg/dL (ref 0.61–1.24)
GFR calc Af Amer: >60 mL/min (ref >60)
GFR calc Af Amer: >60 mL/min (ref >60)
GFR calc non Af Amer: >60 mL/min (ref >60)
GFR calc non Af Amer: >60 mL/min (ref >60)
Glucose, Bld: 132 mg/dL — ABNORMAL HIGH (ref 70–99)
Glucose, Bld: 167 mg/dL — ABNORMAL HIGH (ref 70–99)
Potassium: 4.4 mmol/L (ref 3.5–5.1)
Potassium: 4.7 mmol/L (ref 3.5–5.1)
Sodium: 138 mmol/L (ref 135–145)
Sodium: 136 mmol/L (ref 135–145)

## 2019-08-04 LAB — HEPARIN LEVEL (UNFRACTIONATED)
Heparin Unfractionated: 0.86 IU/mL — ABNORMAL HIGH (ref 0.30–0.70)
Heparin Unfractionated: 0.74 IU/mL — ABNORMAL HIGH (ref 0.30–0.70)
Heparin Unfractionated: 0.71 IU/mL — ABNORMAL HIGH (ref 0.30–0.70)
Heparin Unfractionated: 0.51 IU/mL (ref 0.30–0.70)

## 2019-08-04 LAB — LACTIC ACID, PLASMA
Lactic Acid, Venous: 1.5 mmol/L (ref 0.5–1.9)
Lactic Acid, Venous: 1.4 mmol/L (ref 0.5–1.9)

## 2019-08-04 LAB — HEPATIC FUNCTION PANEL
ALT: 20 U/L (ref 0–44)
AST: 26 U/L (ref 15–41)
Albumin: 2.7 g/dL — ABNORMAL LOW (ref 3.5–5.0)
Alkaline Phosphatase: 52 U/L (ref 38–126)
Bilirubin, Direct: 0.2 mg/dL (ref 0.0–0.2)
Indirect Bilirubin: 1.1 mg/dL — ABNORMAL HIGH (ref 0.3–0.9)
Total Bilirubin: 1.3 mg/dL — ABNORMAL HIGH (ref 0.3–1.2)
Total Protein: 4.6 g/dL — ABNORMAL LOW (ref 6.5–8.1)

## 2019-08-04 LAB — APTT
aPTT: 122 seconds — ABNORMAL HIGH (ref 24–36)
aPTT: 86 seconds — ABNORMAL HIGH (ref 24–36)
aPTT: 67 seconds — ABNORMAL HIGH (ref 24–36)
aPTT: 52 seconds — ABNORMAL HIGH (ref 24–36)

## 2019-08-04 LAB — LACTATE DEHYDROGENASE: LDH: 565 U/L — ABNORMAL HIGH (ref 98–192)

## 2019-08-04 LAB — FIBRINOGEN: Fibrinogen: 134 mg/dL — ABNORMAL LOW (ref 210–475)

## 2019-08-04 LAB — PHOSPHORUS: Phosphorus: 2.7 mg/dL (ref 2.5–4.6)

## 2019-08-04 LAB — PROTIME-INR
INR: 1.6 — ABNORMAL HIGH (ref 0.8–1.2)
Prothrombin Time: 18.6 seconds — ABNORMAL HIGH (ref 11.4–15.2)

## 2019-08-04 LAB — MAGNESIUM: Magnesium: 2.8 mg/dL — ABNORMAL HIGH (ref 1.7–2.4)

## 2019-08-04 MED ORDER — CHLORHEXIDINE GLUCONATE 0.12% ORAL RINSE (MEDLINE KIT)
15.0000 mL | Freq: Two times a day (BID) | OROMUCOSAL | Status: DC
  Administered 2019-08-03: 15 mL via OROMUCOSAL

## 2019-08-04 MED ORDER — INSULIN DETEMIR 100 UNIT/ML ~~LOC~~ SOLN
5.0000 [IU] | Freq: Two times a day (BID) | SUBCUTANEOUS | Status: DC
  Administered 2019-08-04 – 2019-08-05 (×4): 5 [IU] via SUBCUTANEOUS
  Filled 2019-08-04 (×5): qty 0.05

## 2019-08-04 MED ORDER — DEXTROSE 10 % IV SOLN: INTRAVENOUS | Status: DC | PRN

## 2019-08-04 MED ORDER — SODIUM CHLORIDE 0.9 % IV SOLN
2.0000 g | Freq: Three times a day (TID) | INTRAVENOUS | Status: AC
  Administered 2019-08-04 – 2019-08-05 (×4): 2 g via INTRAVENOUS
  Filled 2019-08-04 (×4): qty 2

## 2019-08-04 MED ORDER — ORAL CARE MOUTH RINSE: 15.0000 mL | OROMUCOSAL | Status: DC

## 2019-08-04 MED ORDER — INSULIN ASPART 100 UNIT/ML ~~LOC~~ SOLN
1.0000 [IU] | SUBCUTANEOUS | Status: DC
  Administered 2019-08-04 – 2019-08-09 (×20): 1 [IU] via SUBCUTANEOUS

## 2019-08-04 MED ORDER — VANCOMYCIN HCL 1250 MG/250ML IV SOLN
1250.0000 mg | Freq: Two times a day (BID) | INTRAVENOUS | Status: DC
  Administered 2019-08-04 – 2019-08-05 (×3): 1250 mg via INTRAVENOUS
  Filled 2019-08-04 (×4): qty 250

## 2019-08-04 MED ORDER — INSULIN ASPART 100 UNIT/ML ~~LOC~~ SOLN
3.0000 [IU] | SUBCUTANEOUS | Status: DC
  Administered 2019-08-04 – 2019-08-05 (×5): 6 [IU] via SUBCUTANEOUS
  Administered 2019-08-05 – 2019-08-08 (×8): 3 [IU] via SUBCUTANEOUS
  Administered 2019-08-08 (×2): 6 [IU] via SUBCUTANEOUS
  Administered 2019-08-08: 4 [IU] via SUBCUTANEOUS
  Administered 2019-08-08: 3 [IU] via SUBCUTANEOUS
  Administered 2019-08-09: 6 [IU] via SUBCUTANEOUS
  Administered 2019-08-09: 3 [IU] via SUBCUTANEOUS
  Administered 2019-08-09: 6 [IU] via SUBCUTANEOUS
  Administered 2019-08-09: 3 [IU] via SUBCUTANEOUS
  Administered 2019-08-09 – 2019-08-10 (×2): 6 [IU] via SUBCUTANEOUS
  Administered 2019-08-10: 3 [IU] via SUBCUTANEOUS
  Administered 2019-08-10 (×3): 6 [IU] via SUBCUTANEOUS
  Administered 2019-08-10: 01:00:00 3 [IU] via SUBCUTANEOUS
  Administered 2019-08-11 (×3): 6 [IU] via SUBCUTANEOUS
  Administered 2019-08-11: 9 [IU] via SUBCUTANEOUS
  Administered 2019-08-11 – 2019-08-12 (×5): 6 [IU] via SUBCUTANEOUS
  Administered 2019-08-12: 9 [IU] via SUBCUTANEOUS
  Administered 2019-08-12: 6 [IU] via SUBCUTANEOUS
  Administered 2019-08-12 (×2): 3 [IU] via SUBCUTANEOUS
  Administered 2019-08-13 (×4): 6 [IU] via SUBCUTANEOUS
  Administered 2019-08-13 – 2019-08-14 (×5): 3 [IU] via SUBCUTANEOUS
  Administered 2019-08-14 (×2): 6 [IU] via SUBCUTANEOUS
  Administered 2019-08-15 (×2): 3 [IU] via SUBCUTANEOUS
  Administered 2019-08-15 – 2019-08-16 (×2): 6 [IU] via SUBCUTANEOUS
  Administered 2019-08-16: 9 [IU] via SUBCUTANEOUS
  Administered 2019-08-16 – 2019-08-17 (×3): 6 [IU] via SUBCUTANEOUS
  Administered 2019-08-17: 3 [IU] via SUBCUTANEOUS
  Administered 2019-08-17: 6 [IU] via SUBCUTANEOUS
  Administered 2019-08-18 – 2019-08-19 (×6): 3 [IU] via SUBCUTANEOUS
  Administered 2019-08-19: 12:00:00 6 [IU] via SUBCUTANEOUS
  Administered 2019-08-20 – 2019-08-22 (×8): 3 [IU] via SUBCUTANEOUS
  Administered 2019-08-22: 6 [IU] via SUBCUTANEOUS
  Administered 2019-08-22 – 2019-08-23 (×3): 3 [IU] via SUBCUTANEOUS
  Administered 2019-08-23 (×4): 6 [IU] via SUBCUTANEOUS
  Administered 2019-08-23 – 2019-08-24 (×2): 3 [IU] via SUBCUTANEOUS
  Administered 2019-08-24: 16:00:00 6 [IU] via SUBCUTANEOUS
  Administered 2019-08-24 (×2): 3 [IU] via SUBCUTANEOUS
  Administered 2019-08-24 – 2019-08-25 (×5): 6 [IU] via SUBCUTANEOUS
  Administered 2019-08-25 – 2019-08-26 (×5): 3 [IU] via SUBCUTANEOUS
  Administered 2019-08-26 (×2): 6 [IU] via SUBCUTANEOUS
  Administered 2019-08-26: 08:00:00 3 [IU] via SUBCUTANEOUS
  Administered 2019-08-27 (×4): 6 [IU] via SUBCUTANEOUS
  Administered 2019-08-27: 3 [IU] via SUBCUTANEOUS
  Administered 2019-08-27 – 2019-08-28 (×5): 6 [IU] via SUBCUTANEOUS
  Administered 2019-08-29 – 2019-08-30 (×2): 3 [IU] via SUBCUTANEOUS
  Administered 2019-08-30: 6 [IU] via SUBCUTANEOUS
  Administered 2019-08-30 (×4): 3 [IU] via SUBCUTANEOUS
  Administered 2019-08-31 (×6): 6 [IU] via SUBCUTANEOUS
  Administered 2019-09-01 (×2): 3 [IU] via SUBCUTANEOUS
  Administered 2019-09-01 – 2019-09-02 (×7): 6 [IU] via SUBCUTANEOUS
  Administered 2019-09-02 (×2): 3 [IU] via SUBCUTANEOUS
  Administered 2019-09-02: 08:00:00 6 [IU] via SUBCUTANEOUS
  Administered 2019-09-03: 05:00:00 3 [IU] via SUBCUTANEOUS
  Administered 2019-09-03 (×4): 6 [IU] via SUBCUTANEOUS
  Administered 2019-09-03 – 2019-09-04 (×2): 3 [IU] via SUBCUTANEOUS
  Administered 2019-09-04: 6 [IU] via SUBCUTANEOUS
  Administered 2019-09-04: 05:00:00 3 [IU] via SUBCUTANEOUS
  Administered 2019-09-04 – 2019-09-05 (×3): 6 [IU] via SUBCUTANEOUS
  Administered 2019-09-05: 3 [IU] via SUBCUTANEOUS
  Administered 2019-09-05: 9 [IU] via SUBCUTANEOUS
  Administered 2019-09-05 (×2): 6 [IU] via SUBCUTANEOUS
  Administered 2019-09-05: 9 [IU] via SUBCUTANEOUS
  Administered 2019-09-06 (×2): 6 [IU] via SUBCUTANEOUS
  Administered 2019-09-06: 9 [IU] via SUBCUTANEOUS
  Administered 2019-09-06 – 2019-09-07 (×5): 6 [IU] via SUBCUTANEOUS
  Administered 2019-09-07 (×2): 9 [IU] via SUBCUTANEOUS
  Administered 2019-09-07: 6 [IU] via SUBCUTANEOUS

## 2019-08-04 MED ORDER — CHLORHEXIDINE GLUCONATE 0.12% ORAL RINSE (MEDLINE KIT)
15.0000 mL | Freq: Two times a day (BID) | OROMUCOSAL | Status: DC
  Administered 2019-08-05 – 2019-10-23 (×153): 15 mL via OROMUCOSAL

## 2019-08-04 MED ORDER — SODIUM CHLORIDE 0.9% FLUSH
10.0000 mL | Freq: Two times a day (BID) | INTRAVENOUS | Status: DC
  Administered 2019-08-04 – 2019-08-08 (×6): 10 mL
  Administered 2019-08-09: 20 mL
  Administered 2019-08-09 – 2019-09-01 (×23): 10 mL
  Administered 2019-09-02: 10:00:00 20 mL
  Administered 2019-09-02 – 2019-09-08 (×8): 10 mL

## 2019-08-04 MED ORDER — SODIUM CHLORIDE 0.9% FLUSH: 10.0000 mL | INTRAVENOUS | Status: DC | PRN

## 2019-08-04 NOTE — Progress Notes (Signed)
Vent changes made per Dr. Nelda Marseille.   Current vent settings: PC 20, peep 10, RR 20, 40% Driving pressure is now 15 and MVE is 4.0.

## 2019-08-04 NOTE — Progress Notes (Signed)
CCM contacted due to a decrease in SpO2, patient initiating breathes, and ABG changes.  Dr. Lynetta Mare requested sweep to be increased to 7 Lpm and said he will make changes to sedation/paralytic orders.  ABG will be obtained in an hour.

## 2019-08-04 NOTE — Progress Notes (Signed)
ANTICOAGULATION CONSULT NOTE -  Pharmacy Consult for heparin Indication: ECMO  No Known Allergies  Patient Measurements: Height: 5' 8"  (172.7 cm) Weight: 281 lb 4.9 oz (127.6 kg) IBW/kg (Calculated) : 68.4 Heparin Dosing Weight: 100.7 kg  Vital Signs: Temp: 99.7 F (37.6 C) (12/27 0400) Temp Source: Core (12/27 0400) BP: 101/40 (12/27 0800) Pulse Rate: 90 (12/27 0900)  Labs: Recent Labs    08/03/19 0200 08/03/19 2108 08/04/19 0003 08/04/19 0200 08/04/19 0507 08/04/19 0512 08/04/19 0749 08/04/19 0913  HGB  --  9.8* 11.9*  11.9* 12.6* 11.8* 11.2*  --  12.2*  HCT  --  31.1* 35.0*  37.7* 38.2* 36.9* 33.0*  --  36.0*  PLT  --  74* 94* 91* 82*  --   --   --   APTT  --  >200*  --  122*  --   --  86*  --   LABPROT  --  22.3*  --   --  18.6*  --   --   --   INR  --  2.0*  --   --  1.6*  --   --   --   HEPARINUNFRC  --  1.32*  --  0.86*  --   --  0.74*  --   CREATININE 0.88 1.12  --   --  0.77  --   --   --     Estimated Creatinine Clearance: 151.9 mL/min (by C-G formula based on SCr of 0.77 mg/dL).   Medical History: Past Medical History:  Diagnosis Date  . Asthma   . GERD (gastroesophageal reflux disease)   . Headache   . History of kidney stones    LEFT URETERAL STONE  . HTN (hypertension)   . Pancreatitis 2018   GALLBALDDER SLUDGE CAUSED ISSUED RESOLVED    Medications:  Scheduled:  . artificial tears  1 application Both Eyes Q7Y  . vitamin C  500 mg Per Tube Daily  . chlorhexidine gluconate (MEDLINE KIT)  15 mL Mouth Rinse BID  . chlorhexidine gluconate (MEDLINE KIT)  15 mL Mouth Rinse BID  . Chlorhexidine Gluconate Cloth  6 each Topical Daily  . dexamethasone (DECADRON) injection  6 mg Intravenous Q24H  . feeding supplement (PRO-STAT SUGAR FREE 64)  30 mL Per Tube BID  . feeding supplement (VITAL HIGH PROTEIN)  1,000 mL Per Tube Q24H  . insulin aspart  1 Units Subcutaneous Q4H  . insulin aspart  3-9 Units Subcutaneous Q4H  . insulin detemir  5 Units  Subcutaneous Q12H  . mouth rinse  15 mL Mouth Rinse BID  . mouth rinse  15 mL Mouth Rinse 10 times per day  . pantoprazole (PROTONIX) IV  40 mg Intravenous QHS  . zinc sulfate  220 mg Per Tube Daily    Assessment: 51 yom known COVID+, has been declining with ARDS for past 48 hours requiring intubation and max vent settings. Underwent VV ECMO on 12/26.   Was on enoxaparin 70 mg on 12/26 at 1047. Received 7500 units of heparin in cath lab for cannulization. Started on heparin at 1500 units/hr. Hgb 11.2, plt 82.   Heparin level this AM just slightly over range at 0.74.  Aptt at goal at 86.  No overt bleeding or complications noted.    Goal of Therapy:  Heparin level 0.5-0.7 units/ml >>targeting at higher end of goal range aPTT 66-102 seconds  Monitor platelets by anticoagulation protocol: Yes   Plan:  Decrease IV heparin to 1350 units/hr  Recheck heparin level in 6 hrs. CBC and heparin levels per ECMO labs.  [name], [facility] Clinical Pharmacist Phone [phone]  08/04/2019 9:38 AM

## 2019-08-04 NOTE — Progress Notes (Signed)
Advanced Heart Failure Rounding Note   Subjective:    Stable on VV ecmo with good flows and sats in mid to high 90s. Sweep gas increased last night for rising pCO2. . On Vasporessin 0.04. MAPS stable.   Received one unit of RBCs overnight. No evidence of overt bleeding.   On vent 40% PEEP 10. Bilateral CTs in for PTXs  ECMO parameters Flow 4.5 RPM 3150 dP 16 Co-ox 74%   Objective:   Weight Range:  Vital Signs:   Temp:  [98.4 F (36.9 C)-99.7 F (37.6 C)] 99.7 F (37.6 C) (12/27 0400) Pulse Rate:  [77-275] 77 (12/27 0739) Resp:  [0-41] 20 (12/27 0739) BP: (101-188)/(48-104) 129/51 (12/27 0739) SpO2:  [0 %-100 %] 96 % (12/27 0739) Arterial Line BP: (98-143)/(21-83) 108/49 (12/27 0700) FiO2 (%):  [40 %-100 %] 40 % (12/27 0739) Weight:  [121.8 kg-136.1 kg] 127.6 kg (12/27 0600) Last BM Date: (PTA)  Weight change: Filed Weights   08/03/19 1900 08/03/19 2100 08/04/19 0600  Weight: (!) 136.1 kg 121.8 kg 127.6 kg    Intake/Output:   Intake/Output Summary (Last 24 hours) at 08/04/2019 0747 Last data filed at 08/04/2019 0700 Gross per 24 hour  Intake 3608.51 ml  Output 2180 ml  Net 1428.51 ml     Physical Exam: General: Intubated/sedated HEENT: normal + ETT Neck: supple. LIJ TLC Cor: PMI nondisplaced. Regular rate & rhythm. No rubs, gallops or murmurs. Bilateral SQ emphysema Lungs: coarse Abdomen: obese soft, nontender, nondistended. No hepatosplenomegaly. No bruits or masses. Good bowel sounds. Extremities: no cyanosis, clubbing, rash, mild edema  Bilateral femoral venous cannulas ok  Neuro: Intubated sedated  Telemetry: Sinus 70-80s Personally reviewed  Labs: Basic Metabolic Panel: Recent Labs  Lab 07/31/19 0300 08/02/19 0655 08/03/19 0200 08/03/19 1040 08/03/19 2108 08/03/19 2131 08/03/19 2222 08/04/19 0003 08/04/19 0407 08/04/19 0507 08/04/19 0512  NA 136 137 138  --  138  --  139 141 140 138 138  K 4.6 4.0 4.1  --  5.0  --  4.4 4.3 4.3  4.4 4.2  CL 101 100 102  --  110  --   --   --   --  108  --   CO2 _0 --  21*  --   --   --   --  23  --   GLUCOSE 87 84 94  --  125*  --   --   --   --  132*  --   BUN 31* 35* 36*  --  39*  --   --   --   --  35*  --   CREATININE 0.83 0.91 0.88  --  1.12  --   --   --   --  0.77  --   CALCIUM 8.5* 8.6* 8.8*  --  7.2*  --   --   --   --  7.6*  --   MG  --  2.1  --  2.4  --   --   --   --   --   --   --   PHOS  --  4.3  --  7.1*  --  4.2  --   --   --   --   --     Liver Function Tests: Recent Labs  Lab 08/02/19 0655 08/03/19 0200 08/03/19 2108 08/04/19 0507  AST 31 42* 27 26  ALT _1 ALKPHOS 75 104 77  52  BILITOT 0.9 1.2 0.8 1.3*  PROT 6.2* 6.7 4.6* 4.6*  ALBUMIN 3.2* 3.4* 2.4* 2.7*   No results for input(s): LIPASE, AMYLASE in the last 168 hours. No results for input(s): AMMONIA in the last 168 hours.  CBC: Recent Labs  Lab 08/02/19 0655 08/03/19 2108 08/04/19 0003 08/04/19 0200 08/04/19 0407 08/04/19 0507 08/04/19 0512  WBC 15.2* 16.0* 17.6* 16.8*  --  14.2*  --   NEUTROABS 11.7*  --   --   --   --   --   --   HGB 16.4 9.8* 11.9*  11.9* 12.6* 11.9* 11.8* 11.2*  HCT 50.4 31.1* 35.0*  37.7* 38.2* 35.0* 36.9* 33.0*  MCV 84.3 88.1 86.9 86.0  --  86.4  --   PLT 185 74* 94* 91*  --  82*  --     Cardiac Enzymes: No results for input(s): CKTOTAL, CKMB, CKMBINDEX, TROPONINI in the last 168 hours.  BNP: BNP (last 3 results) Recent Labs    07/22/19 1039  BNP 15.3    ProBNP (last 3 results) No results for input(s): PROBNP in the last 8760 hours.    Other results:  Imaging: DG Abd 1 View  Result Date: 08/03/2019 CLINICAL DATA:  NG tube placement EXAM: ABDOMEN - 1 VIEW COMPARISON:  None. FINDINGS: 0948 hours. NG tube tip is in the distal stomach near the pylorus. Bowel gas pattern is nonobstructive. Visualized bony anatomy unremarkable. Subcutaneous emphysema noted lower right chest wall. IMPRESSION: NG tube tip is positioned in the distal  stomach. Electronically Signed   By: Misty Stanley M.D.   On: 08/03/2019 10:27   DG Chest Port 1 View  Result Date: 08/03/2019 CLINICAL DATA:  43 year old male with acute respiratory failure and hypoxia. EXAM: PORTABLE CHEST 1 VIEW COMPARISON:  Earlier radiograph dated 08/03/2019. FINDINGS: Evaluation is very limited due to extensive subcutaneous emphysema. Endotracheal tube with tip approximately 3 cm above the carina, enteric tube extending inferior to the diaphragm with tip beyond the inferior margin of the image, bilateral chest tubes and left IJ central venous line in similar position. Partially visualized inferiorly accessed tubes with tip of the more proximal tube close to the right atrium/SVC junction and the tip of the more inferior tube at the junction of the right atrium/IVC. Clinical correlation is recommended. Bilateral airspace opacities similar to prior radiograph. Stable cardiomediastinal silhouette. No acute osseous pathology. IMPRESSION: 1. No significant interval change in the appearance of the lungs compared to the earlier radiograph. 2. Support lines and tubes in similar position. Interval placement of two inferiorly accessed central venous lines. Clinical correlation is recommended. 3. Extensive subcutaneous emphysema similar to prior radiograph. Electronically Signed   By: Anner Crete M.D.   On: 08/03/2019 22:12   DG Chest Port 1 View  Result Date: 08/03/2019 CLINICAL DATA:  COVID-19 positive. ARDS. EXAM: PORTABLE CHEST 1 VIEW COMPARISON:  August 03, 2019 FINDINGS: Extensive subcutaneous emphysema is identified over bilateral chest. Bilateral chest tubes, endotracheal tube are identified. Feeding tube is identified with distal tip not included on film but is at least in the stomach. Diffuse patchy consolidation of bilateral lungs are identified unchanged. IMPRESSION: 1. Extensive subcutaneous emphysema is identified over bilateral chest. 2. Bilateral chest tubes are  identified. 3. Diffuse patchy consolidation of bilateral lungs are identified unchanged. Electronically Signed   By: Abelardo Diesel M.D.   On: 08/03/2019 10:37   DG CHEST PORT 1 VIEW  Result Date: 08/03/2019 CLINICAL DATA:  Intubation. EXAM: PORTABLE CHEST  1 VIEW COMPARISON:  08/02/2019 FINDINGS: Endotracheal tube has tip 8.4 cm above the carina. Lungs are hypoinflated demonstrate a stable small left-sided pneumothorax. Previously seen small right apical pneumothorax is not well visualized on the current exam. Persistent moderate bilateral airspace opacification without significant change. Cardiomediastinal silhouette is within normal. Persistent moderate subcutaneous emphysema over the neck and chest. IMPRESSION: 1. Moderate bilateral persistent airspace process without significant change likely multifocal infection. Stable subcutaneous emphysema over the neck and chest. 2. Previously seen small right apical pneumothorax not visualized on the current exam. Stable small left pneumothorax. 3.  Endotracheal tube with tip 8.4 cm above the carina. Electronically Signed   By: Marin Olp M.D.   On: 08/03/2019 08:02   DG CHEST PORT 1 VIEW  Result Date: 08/02/2019 CLINICAL DATA:  Bilateral pneumothoraces. EXAM: PORTABLE CHEST 1 VIEW COMPARISON:  08/01/2019 FINDINGS: Examination demonstrates small bilateral pneumothoraces without significant change. Moderate bilateral airspace opacification without significant change. Cardiomediastinal silhouette is within normal. Moderate subcutaneous emphysema over the neck and chest unchanged. IMPRESSION: 1. Persistent moderate bilateral multifocal airspace process likely multifocal pneumonia. 2. Stable small bilateral pneumothoraces. Subcutaneous emphysema over the neck and chest unchanged. Electronically Signed   By: Marin Olp M.D.   On: 08/02/2019 09:26      Medications:     Scheduled Medications: . artificial tears  1 application Both Eyes F4E  . vitamin C   500 mg Per Tube Daily  . benzonatate  100 mg Oral TID  . chlorhexidine gluconate (MEDLINE KIT)  15 mL Mouth Rinse BID  . chlorhexidine gluconate (MEDLINE KIT)  15 mL Mouth Rinse BID  . Chlorhexidine Gluconate Cloth  6 each Topical Daily  . dexamethasone (DECADRON) injection  6 mg Intravenous Q24H  . feeding supplement (PRO-STAT SUGAR FREE 64)  30 mL Per Tube BID  . feeding supplement (VITAL HIGH PROTEIN)  1,000 mL Per Tube Q24H  . insulin aspart  1 Units Subcutaneous Q4H  . insulin aspart  3-9 Units Subcutaneous Q4H  . insulin detemir  5 Units Subcutaneous Q12H  . mouth rinse  15 mL Mouth Rinse BID  . mouth rinse  15 mL Mouth Rinse 10 times per day  . metoprolol tartrate  75 mg Oral BID  . pantoprazole (PROTONIX) IV  40 mg Intravenous QHS  . zinc sulfate  220 mg Per Tube Daily     Infusions: . sodium chloride    . sodium chloride 10 mL/hr at 08/04/19 0700  . albumin human Stopped (08/04/19 0346)  . ceFEPime (MAXIPIME) IV Stopped (08/04/19 0530)  . cisatracurium (NIMBEX) infusion 1.5 mcg/kg/min (08/04/19 0730)  . dextrose    . fentaNYL infusion INTRAVENOUS 300 mcg/hr (08/04/19 0700)  . heparin 1,400 Units/hr (08/04/19 0700)  . midazolam 10 mg/hr (08/04/19 0700)  . norepinephrine (LEVOPHED) Adult infusion Stopped (08/03/19 2100)  . vancomycin    . vasopressin (PITRESSIN) infusion - *FOR SHOCK* 0.03 Units/min (08/04/19 0700)     PRN Medications:  Place/Maintain arterial line **AND** sodium chloride, sodium chloride, acetaminophen, albumin human, dextrose, fentaNYL, fentaNYL, guaiFENesin-dextromethorphan, Melatonin, metoprolol tartrate, midazolam, morphine injection, ondansetron (ZOFRAN) IV, sodium chloride   Assessment/Plan:    1. Acute hypoxic/hypercarbic respiratory failure due to COVID PNA - remained markedly hypoxic despite full support - Intubated 12/26. S/p bilateral CTs 12/26 - TEE on 12/26 LVEF 70% RV ok - VV ECMO begun 12/26 - Day #2 VV ECMO - Flows stable.  Oxygenation much improved - Continue management per ECMO team - Heparin 0.86 (Goal 0.5-0.7)  Adjusted. Discussed dosing with PharmD personally. - Continue VP. Stop metoprolol.  2. Bilateral PTX - due to barotrauma. CTs in place  3. COVID PNA - management of COVID infection per CCM - s/p 10 days of remdesivir - 12/16, 12/2 s/p tociluzimab - 12/17 s/p convalescent plasma - On decadron - On vanc & cefepime while on ECMO (started 12/26)   D/w CCM and TCTS at bedside  CRITICAL CARE Performed by: Glori Bickers  Total critical care time: 45 minutes  Critical care time was exclusive of separately billable procedures and treating other patients.  Critical care was necessary to treat or prevent imminent or life-threatening deterioration.  Critical care was time spent personally by me (independent of midlevel providers or residents) on the following activities: development of treatment plan with patient and/or surrogate as well as nursing, discussions with consultants, evaluation of patient's response to treatment, examination of patient, obtaining history from patient or surrogate, ordering and performing treatments and interventions, ordering and review of laboratory studies, ordering and review of radiographic studies, pulse oximetry and re-evaluation of patient's condition.    Length of Stay: 13   Glori Bickers MD 08/04/2019, 7:47 AM  Advanced Heart Failure Team Pager [phone] (M-F; [place])  Please contact [facility] for night-coverage after hours (4p -7a ) and weekends on [email or web address]

## 2019-08-04 NOTE — Progress Notes (Signed)
1 Day Post-Op Procedure(s) (LRB): ECMO CANNULATION (N/A) Subjective: Intubated/sedated Objective: Vital signs in last 24 hours: Temp:  [98.4 F (36.9 C)-99.7 F (37.6 C)] 99.7 F (37.6 C) (12/27 0400) Pulse Rate:  [77-275] 77 (12/27 0700) Cardiac Rhythm: Sinus tachycardia (12/27 0400) Resp:  [0-41] 0 (12/27 0700) BP: (101-188)/(48-104) 112/62 (12/27 0400) SpO2:  [0 %-100 %] 96 % (12/27 0700) Arterial Line BP: (98-143)/(21-83) 108/49 (12/27 0700) FiO2 (%):  [40 %-100 %] 40 % (12/27 0400) Weight:  [121.8 kg-136.1 kg] 127.6 kg (12/27 0600)  Hemodynamic parameters for last 24 hours: CVP:  [8 mmHg-14 mmHg] 10 mmHg  Intake/Output from previous day: 12/26 0701 - 12/27 0700 In: 3608.5 [I.V.:2268.8; NG/GT:200; IV Piggyback:1139.7] Out: 2180 [Urine:1960; Chest Tube:220] Intake/Output this shift: No intake/output data recorded.  General appearance: sedated/paralyzed Neurologic: unable to assess Heart: regular rate and rhythm, S1, S2 normal, no murmur, click, rub or gallop Lungs: diminished breath sounds bilaterally Abdomen: soft, non-tender; bowel sounds normal; no masses,  no organomegaly Extremities: edema 1+ Wound: ecmo sites intact  Lab Results: Recent Labs    08/04/19 0200 08/04/19 0507 08/04/19 0512  WBC 16.8* 14.2*  --   HGB 12.6* 11.8* 11.2*  HCT 38.2* 36.9* 33.0*  PLT 91* 82*  --    BMET:  Recent Labs    08/03/19 2108 08/04/19 0507 08/04/19 0512  NA 138 138 138  K 5.0 4.4 4.2  CL 110 108  --   CO2 21* 23  --   GLUCOSE 125* 132*  --   BUN 39* 35*  --   CREATININE 1.12 0.77  --   CALCIUM 7.2* 7.6*  --     PT/INR:  Recent Labs    08/04/19 0507  LABPROT 18.6*  INR 1.6*   ABG    Component Value Date/Time   PHART 7.337 (L) 08/04/2019 0512   HCO3 23.8 08/04/2019 0512   TCO2 25 08/04/2019 0512   ACIDBASEDEF 2.0 08/04/2019 0512   O2SAT 92.0 08/04/2019 0512   CBG (last 3)  Recent Labs    08/04/19 0405 08/04/19 0514 08/04/19 0605  GLUCAP 139*  127* 112*    Assessment/Plan: S/P Procedure(s) (LRB): ECMO CANNULATION (N/A) continue V-V ecmo; monitor ABGs to adjust sweep on ecmo and/or vent settings  pneumonia management per CCM/pharmacy   LOS: 13 days    Wonda Olds 08/04/2019

## 2019-08-04 NOTE — Progress Notes (Addendum)
ANTICOAGULATION CONSULT NOTE -  Pharmacy Consult for heparin Indication: ECMO  No Known Allergies  Patient Measurements: Height: 5' 8"  (172.7 cm) Weight: 281 lb 4.9 oz (127.6 kg) IBW/kg (Calculated) : 68.4 Heparin Dosing Weight: 100.7 kg  Vital Signs: BP: 110/52 (12/27 1600) Pulse Rate: 75 (12/27 1600)  Labs: Recent Labs    08/03/19 0200 08/03/19 0946 08/03/19 2108 08/03/19 2108 08/04/19 0003 08/04/19 0200 08/04/19 0507 08/04/19 0749 08/04/19 0913 08/04/19 1008 08/04/19 1310 08/04/19 1311  HGB  --   --  9.8*   < > 11.9*  11.9* 12.6* 11.8*  --  12.2* 11.6*  --  11.6*  HCT  --   --  31.1*   < > 35.0*  37.7* 38.2* 36.9*  --  36.0* 34.0*  --  34.0*  PLT  --   --  74*  --  94* 91* 82*  --   --   --   --   --   APTT  --    < > >200*  --   --  122*  --  86*  --   --  67*  --   LABPROT  --   --  22.3*  --   --   --  18.6*  --   --   --   --   --   INR  --   --  2.0*  --   --   --  1.6*  --   --   --   --   --   HEPARINUNFRC  --    < > 1.32*  --   --  0.86*  --  0.74*  --   --  0.71*  --   CREATININE 0.88  --  1.12  --   --   --  0.77  --   --   --   --   --    < > = values in this interval not displayed.    Estimated Creatinine Clearance: 151.9 mL/min (by C-G formula based on SCr of 0.77 mg/dL).   Medical History: Past Medical History:  Diagnosis Date  . Asthma   . GERD (gastroesophageal reflux disease)   . Headache   . History of kidney stones    LEFT URETERAL STONE  . HTN (hypertension)   . Pancreatitis 2018   GALLBALDDER SLUDGE CAUSED ISSUED RESOLVED    Medications:  Scheduled:  . artificial tears  1 application Both Eyes E7M  . vitamin C  500 mg Per Tube Daily  . chlorhexidine gluconate (MEDLINE KIT)  15 mL Mouth Rinse BID  . chlorhexidine gluconate (MEDLINE KIT)  15 mL Mouth Rinse BID  . Chlorhexidine Gluconate Cloth  6 each Topical Daily  . dexamethasone (DECADRON) injection  6 mg Intravenous Q24H  . feeding supplement (PRO-STAT SUGAR FREE 64)  30 mL  Per Tube BID  . feeding supplement (VITAL HIGH PROTEIN)  1,000 mL Per Tube Q24H  . insulin aspart  1 Units Subcutaneous Q4H  . insulin aspart  3-9 Units Subcutaneous Q4H  . insulin detemir  5 Units Subcutaneous Q12H  . mouth rinse  15 mL Mouth Rinse BID  . mouth rinse  15 mL Mouth Rinse 10 times per day  . pantoprazole (PROTONIX) IV  40 mg Intravenous QHS  . zinc sulfate  220 mg Per Tube Daily    Assessment: 36 yom known COVID+, has been declining with ARDS for past 48 hours requiring intubation and max vent settings.  Underwent VV ECMO on 12/26.   Was on enoxaparin 70 mg on 12/26 at 1047. Received 7500 units of heparin in cath lab for cannulization.   Heparin level this afternoon came back therapeutic at 0.71, on 1350 units/hr. APTT therapeutic at 67. Hgb 11.,6 plt 82 this morning. No s/sx of bleeding.     Goal of Therapy:  Heparin level 0.5-0.7 units/ml >>targeting at higher end of goal range aPTT 66-102 seconds  Monitor platelets by anticoagulation protocol: Yes   Plan:  Continue IV heparin at 1350 units/hr Will change to every 12 hours dosing starting on 12/28 at 0500 CBC and heparin levels per ECMO labs.  Antonietta Jewel, PharmD, BCCCP Clinical Pharmacist  Phone: [phone]  Please check AMION for all [place] phone numbers After 10:00 PM, call [place] [phone]  08/04/2019 4:55 PM   ADDENDUM Heparin level trending down now to 0.51 (aPTT 52), still within goal range. Will increase to 1450 units/hr to keep on higher end of goal range and check level in 6 hours - since has been in therapeutic range for last 3 checks adjusted levels to q12 hr starting on 12/28 with AM labs. No bleeding or infusion issues.   Antonietta Jewel, PharmD, [place] Clinical Pharmacist

## 2019-08-04 NOTE — Progress Notes (Signed)
OT Cancellation Note  Patient Details Name: [name] MRN: [id number] DOB: 12-Sep-1975   Cancelled Treatment:    Reason Eval/Treat Not Completed: Medical issues which prohibited therapy.  Intubated and on paralytics.  Will follow along and see as medically appropriate.   Nilsa Nutting., OTR/L Acute Rehabilitation Services Pager [phone] Office [phone]   Lucille Passy M 08/04/2019, 10:58 AM

## 2019-08-04 NOTE — Progress Notes (Signed)
NAME:  [name], MRN:  [id number], DOB:  1976-08-04, LOS: 63 ADMISSION DATE:  07/22/2019, CONSULTATION DATE:  12/24 REFERRING MD:  Sloan Leiter, CHIEF COMPLAINT:  Dyspnea   Brief History   [name] admitted 12/14 with COVID pneumonia causing acute hypoxemic respiratory failure.   Developed pneumomediastinum 12/23 with concern for possible bilateral small pneumothoraces on 12/24.  PCCM consulted for evaluation of pneumothoraces  Past Medical History  GERD HTN Asthma  Significant Hospital Events   12/14 admit with hypoxemic respiratory failure in setting of COVID-19 pneumonia 12/23 noted to have pneumomediastinum on chest x-ray 12/24 PCCM consulted for evaluation 12/26 worsening hypoxemia, intubated  12/26 bilateral pneumothoraces chest tubes inserted 12/26 bifemoral cannulation for VV ECMO.  Consults:  PCCM  Procedures:  12/26 ETT 12/26 bilateral chest tubes 12/26 bifemoral VV ECMO cannulation  Significant Diagnostic Tests:  CT chest 12/22 >> extensive pneumomediastinum, multifocal patchy bilateral groundglass opacitiesCT chest 12/22 >> extensive pneumomediastinum, multifocal patchy bilateral groundglass opacities  Micro Data:  BCx2 12/14 >> negative  Sputum 12/15 >> negative   Antimicrobials:  Received remdesivir, steroids, Actemra and convalescent plasma   Interim history/subjective:   ECMO initiated around 8 PM last night.  Good saturations from the start.  Received total of approximately 2 L of combined crystalloid and blood resuscitation.  Objective   Blood pressure (!) 129/51, pulse 78, temperature 99.7 F (37.6 C), temperature source Core, resp. rate (!) 0, height 5' 8"  (1.727 m), weight 127.6 kg, SpO2 95 %. CVP:  [8 mmHg-14 mmHg] 12 mmHg  Vent Mode: PCV FiO2 (%):  [40 %-100 %] 40 % Set Rate:  [20 bmp-32 bmp] 20 bmp Vt Set:  [410 mL-460 mL] 450 mL PEEP:  [10 SEG31-51 cmH20] 10 cmH20 Plateau Pressure:  [27 cmH20-41 cmH20] 27 cmH20   Intake/Output Summary  (Last 24 hours) at 08/04/2019 0808 Last data filed at 08/04/2019 0800 Gross per 24 hour  Intake 3736 ml  Output 2220 ml  Net 1516 ml   Filed Weights   08/03/19 1900 08/03/19 2100 08/04/19 0600  Weight: (!) 136.1 kg 121.8 kg 127.6 kg   Flow (LPM): 4.84 $ Ventilator Initial/Subsequent : Initial, Vent Mode: PCV, Vt Set: 450 mL, Set Rate: 20 bmp, FiO2 (%): 40 %, I Time: 0.9 Sec(s), PEEP: 10 cmH20  Examination:  General: Morbidly obese man, intubated, sedated, on neuromuscular blockade HENT: ET tube OG tube in place PULM: Pressure control 20/10 with tidal volume 250, plateau pressure 25.  Bronchial breathing with musical crackles throughout.  No visible air leak in either chest tube. Saturation 97% with 4.8 L of flow. CV: Pale but extremities warm.  Heart sounds distant. GI: BS+, soft, nontender MSK: normal bulk and tone Neuro: sedated with neuromuscular blockade. BIS 56.   Resolved Hospital Problem list     Assessment & Plan:    Critically ill due to acute Hypoxemic Respiratory Failure requiring mechanical ventilation and ECMO support due to COVID-19 pneumonia. Pneumomediastinum with small bilateral pneumothoraces  Has tolerated initiation of ECMO particularly well.  Has maintained excellent saturations on initial flow settings but may still worsen over the first 48 hours due to SIRS response generated by the ECMO circuit itself.  Expect patient to still be volume seeking and require vasopressors.  This state should improve in the next 24 to 48 hours. We will continue neuromuscular blockade for today as patient continues to stabilize. Current ventilator settings providing acceptable ultra lung protective ventilation strategy and are not aggravating pneumothoraces.  Daily Goals Checklist  Pain/Anxiety/Delirium protocol (if indicated): Fentanyl and midazolam infusions to maintain BIS < 60. VAP protocol (if indicated): Bundle in place. Respiratory support goals: Maintain ultra  lung protective ventilation. Blood pressure target: MAP >65 - on vasopressin only. DVT prophylaxis: Systemically anticoagulated for ECMO circuit Nutrition Status: Moderate to severe malnutrition due to critical illness.  Tolerating gastric tube feeds at goal. GI prophylaxis: Pantoprazole Fluid status goals: Clinically euvolemic with acceptable CVP.  Allow auto diuresis.  Bolus fluids if difficulty achieving adequate flow rates (line chatter). Urinary catheter: Assessment of intravascular volume Glucose control: Acceptable glycemic control on the combination insulin aspart and basal detemir. Mobility/therapy needs: Bedrest Antibiotic de-escalation: Continue dexamethasone and remdesivir.  Vancomycin and cefepime following ECMO cannulation for 48h Home medication reconciliation: none relevant Daily labs: as per ECMO protocol Code Status: Full  Family Communication: wife updated Disposition: ICU   Labs   CBC: Recent Labs  Lab 08/02/19 0655 08/03/19 2108 08/04/19 0003 08/04/19 0200 08/04/19 0407 08/04/19 0507 08/04/19 0512  WBC 15.2* 16.0* 17.6* 16.8*  --  14.2*  --   NEUTROABS 11.7*  --   --   --   --   --   --   HGB 16.4 9.8* 11.9*  11.9* 12.6* 11.9* 11.8* 11.2*  HCT 50.4 31.1* 35.0*  37.7* 38.2* 35.0* 36.9* 33.0*  MCV 84.3 88.1 86.9 86.0  --  86.4  --   PLT 185 74* 94* 91*  --  82*  --     Basic Metabolic Panel: Recent Labs  Lab 07/31/19 0300 08/02/19 0655 08/03/19 0200 08/03/19 1040 08/03/19 2108 08/03/19 2131 08/03/19 2222 08/04/19 0003 08/04/19 0407 08/04/19 0507 08/04/19 0512  NA 136 137 138  --  138  --  139 141 140 138 138  K 4.6 4.0 4.1  --  5.0  --  4.4 4.3 4.3 4.4 4.2  CL 101 100 102  --  110  --   --   --   --  108  --   CO2 25 24 25   --  21*  --   --   --   --  23  --   GLUCOSE 87 84 94  --  125*  --   --   --   --  132*  --   BUN 31* 35* 36*  --  39*  --   --   --   --  35*  --   CREATININE 0.83 0.91 0.88  --  1.12  --   --   --   --  0.77  --    CALCIUM 8.5* 8.6* 8.8*  --  7.2*  --   --   --   --  7.6*  --   MG  --  2.1  --  2.4  --   --   --   --   --   --   --   PHOS  --  4.3  --  7.1*  --  4.2  --   --   --   --   --    GFR: Estimated Creatinine Clearance: 151.9 mL/min (by C-G formula based on SCr of 0.77 mg/dL). Recent Labs  Lab 07/29/19 0500 07/30/19 0300 07/31/19 0300 08/03/19 2108 08/04/19 0003 08/04/19 0200 08/04/19 0507  PROCALCITON <0.10 <0.10 <0.10  --   --   --   --   WBC  --   --   --  16.0* 17.6* 16.8* 14.2*  LATICACIDVEN  --   --   --  2.1*  --  1.5 1.4    Liver Function Tests: Recent Labs  Lab 08/02/19 0655 08/03/19 0200 08/03/19 2108 08/04/19 0507  AST 31 42* 27 26  ALT 25 29 21 20   ALKPHOS 75 104 77 52  BILITOT 0.9 1.2 0.8 1.3*  PROT 6.2* 6.7 4.6* 4.6*  ALBUMIN 3.2* 3.4* 2.4* 2.7*   No results for input(s): LIPASE, AMYLASE in the last 168 hours. No results for input(s): AMMONIA in the last 168 hours.  ABG    Component Value Date/Time   PHART 7.337 (L) 08/04/2019 0512   PCO2ART 44.7 08/04/2019 0512   PO2ART 71.0 (L) 08/04/2019 0512   HCO3 23.8 08/04/2019 0512   TCO2 25 08/04/2019 0512   ACIDBASEDEF 2.0 08/04/2019 0512   O2SAT 92.0 08/04/2019 0512     Coagulation Profile: Recent Labs  Lab 08/03/19 2108 08/04/19 0507  INR 2.0* 1.6*    Cardiac Enzymes: No results for input(s): CKTOTAL, CKMB, CKMBINDEX, TROPONINI in the last 168 hours.  HbA1C: Hgb A1c MFr Bld  Date/Time Value Ref Range Status  07/24/2019 04:50 AM 6.7 (H) 4.8 - 5.6 % Final    Comment:    (NOTE) Pre diabetes:          5.7%-6.4% Diabetes:              >6.4% Glycemic control for   <7.0% adults with diabetes   09/30/2017 05:36 AM 5.7 (H) 4.8 - 5.6 % Final    Comment:    (NOTE)         Prediabetes: 5.7 - 6.4         Diabetes: >6.4         Glycemic control for adults with diabetes: <7.0     CBG: Recent Labs  Lab 08/04/19 0203 08/04/19 0309 08/04/19 0405 08/04/19 0514 08/04/19 0605  GLUCAP 103*  110* 139* 127* 112*    CRITICAL CARE Performed by: Kipp Brood   Total critical care time: 60 minutes  Critical care time was exclusive of separately billable procedures and treating other patients.  Critical care was necessary to treat or prevent imminent or life-threatening deterioration.  Critical care was time spent personally by me on the following activities: development of treatment plan with patient and/or surrogate as well as nursing, discussions with consultants, evaluation of patient's response to treatment, examination of patient, obtaining history from patient or surrogate, ordering and performing treatments and interventions, ordering and review of laboratory studies, ordering and review of radiographic studies, pulse oximetry, re-evaluation of patient's condition and participation in multidisciplinary rounds.  Kipp Brood, MD [facility] ICU Physician [place]  Pager: [phone] Mobile: [phone] After hours: [phone].

## 2019-08-04 NOTE — Progress Notes (Signed)
[facility] Progress Note Patient Name: [name] DOB: 1975-11-13 MRN: [id number]   Date of Service  08/04/2019  HPI/Events of Note  RN requests check of OG  Tube location  eICU Interventions  OG tube coursing below the diaphragm on CXR        Elexia Friedt U Dhanvi Boesen 08/04/2019, 4:34 AM

## 2019-08-04 NOTE — Progress Notes (Signed)
Vent changes made per MD order and per discussion w/ MD.  MD aware of VT and min volume.  RN aware. Sat 98%

## 2019-08-04 NOTE — Progress Notes (Signed)
ANTICOAGULATION CONSULT NOTE - Follow Up Consult  Pharmacy Consult for heparin Indication: ECMO  Labs: Recent Labs    08/02/19 0655 08/03/19 0200 08/03/19 2108 08/03/19 2222 08/04/19 0003 08/04/19 0200  HGB 16.4  --  9.8* 13.3 11.9*  11.9* 12.6*  HCT 50.4  --  31.1* 39.0 35.0*  37.7* 38.2*  PLT 185  --  74*  --  94* 91*  APTT  --   --  >200*  --   --  122*  LABPROT  --   --  22.3*  --   --   --   INR  --   --  2.0*  --   --   --   HEPARINUNFRC  --   --  1.32*  --   --  0.86*  CREATININE 0.91 0.88 1.12  --   --   --     Assessment: 43yo male remains supratherapeutic on heparin though level has trended down closer to goal; no gtt issues or signs of bleeding per RN.  Goal of Therapy:  Heparin level 0.3-0.7 units/ml   Plan:  Will decrease heparin gtt slightly to 1400 units/hr and check level with next scheduled lab draw in [place], PharmD, BCPS  08/04/2019,4:00 AM

## 2019-08-05 ENCOUNTER — Other Ambulatory Visit: Payer: Self-pay

## 2019-08-05 ENCOUNTER — Inpatient Hospital Stay (HOSPITAL_COMMUNITY): Payer: Managed Care, Other (non HMO)

## 2019-08-05 LAB — POCT I-STAT 7, (LYTES, BLD GAS, ICA,H+H)
Acid-base deficit: 1 mmol/L (ref 0.0–2.0)
Acid-base deficit: 1 mmol/L (ref 0.0–2.0)
Acid-base deficit: 1 mmol/L (ref 0.0–2.0)
Acid-base deficit: 2 mmol/L (ref 0.0–2.0)
Bicarbonate: 22.8 mmol/L (ref 20.0–28.0)
Bicarbonate: 22.6 mmol/L (ref 20.0–28.0)
Bicarbonate: 23.7 mmol/L (ref 20.0–28.0)
Bicarbonate: 25.9 mmol/L (ref 20.0–28.0)
Calcium, Ion: 1.19 mmol/L (ref 1.15–1.40)
Calcium, Ion: 1.17 mmol/L (ref 1.15–1.40)
Calcium, Ion: 1.2 mmol/L (ref 1.15–1.40)
Calcium, Ion: 1.24 mmol/L (ref 1.15–1.40)
HCT: 33 % — ABNORMAL LOW (ref 39.0–52.0)
HCT: 33 % — ABNORMAL LOW (ref 39.0–52.0)
HCT: 32 % — ABNORMAL LOW (ref 39.0–52.0)
HCT: 32 % — ABNORMAL LOW (ref 39.0–52.0)
Hemoglobin: 11.2 g/dL — ABNORMAL LOW (ref 13.0–17.0)
Hemoglobin: 11.2 g/dL — ABNORMAL LOW (ref 13.0–17.0)
Hemoglobin: 10.9 g/dL — ABNORMAL LOW (ref 13.0–17.0)
Hemoglobin: 10.9 g/dL — ABNORMAL LOW (ref 13.0–17.0)
O2 Saturation: 88 %
O2 Saturation: 92 %
O2 Saturation: 91 %
O2 Saturation: 88 %
Patient temperature: 98.5
Patient temperature: 98.2
Patient temperature: 98.2
Patient temperature: 98.3
Potassium: 3.9 mmol/L (ref 3.5–5.1)
Potassium: 3.6 mmol/L (ref 3.5–5.1)
Potassium: 3.3 mmol/L — ABNORMAL LOW (ref 3.5–5.1)
Potassium: 4.5 mmol/L (ref 3.5–5.1)
Sodium: 141 mmol/L (ref 135–145)
Sodium: 141 mmol/L (ref 135–145)
Sodium: 143 mmol/L (ref 135–145)
Sodium: 142 mmol/L (ref 135–145)
TCO2: 24 mmol/L (ref 22–32)
TCO2: 24 mmol/L (ref 22–32)
TCO2: 25 mmol/L (ref 22–32)
TCO2: 28 mmol/L (ref 22–32)
pCO2 arterial: 34.5 mmHg (ref 32.0–48.0)
pCO2 arterial: 32.2 mmHg (ref 32.0–48.0)
pCO2 arterial: 39.8 mmHg (ref 32.0–48.0)
pCO2 arterial: 55.4 mmHg — ABNORMAL HIGH (ref 32.0–48.0)
pH, Arterial: 7.428 (ref 7.350–7.450)
pH, Arterial: 7.453 — ABNORMAL HIGH (ref 7.350–7.450)
pH, Arterial: 7.382 (ref 7.350–7.450)
pH, Arterial: 7.277 — ABNORMAL LOW (ref 7.350–7.450)
pO2, Arterial: 53 mmHg — ABNORMAL LOW (ref 83.0–108.0)
pO2, Arterial: 60 mmHg — ABNORMAL LOW (ref 83.0–108.0)
pO2, Arterial: 62 mmHg — ABNORMAL LOW (ref 83.0–108.0)
pO2, Arterial: 63 mmHg — ABNORMAL LOW (ref 83.0–108.0)

## 2019-08-05 LAB — BASIC METABOLIC PANEL
Anion gap: 6 (ref 5–15)
Anion gap: 3 — ABNORMAL LOW (ref 5–15)
BUN: 41 mg/dL — ABNORMAL HIGH (ref 6–20)
BUN: 36 mg/dL — ABNORMAL HIGH (ref 6–20)
CO2: 22 mmol/L (ref 22–32)
CO2: 27 mmol/L (ref 22–32)
Calcium: 7.8 mg/dL — ABNORMAL LOW (ref 8.9–10.3)
Calcium: 8 mg/dL — ABNORMAL LOW (ref 8.9–10.3)
Chloride: 111 mmol/L (ref 98–111)
Chloride: 110 mmol/L (ref 98–111)
Creatinine, Ser: 0.68 mg/dL (ref 0.61–1.24)
Creatinine, Ser: 0.69 mg/dL (ref 0.61–1.24)
GFR calc Af Amer: >60 mL/min (ref >60)
GFR calc Af Amer: >60 mL/min (ref >60)
GFR calc non Af Amer: >60 mL/min (ref >60)
GFR calc non Af Amer: >60 mL/min (ref >60)
Glucose, Bld: 136 mg/dL — ABNORMAL HIGH (ref 70–99)
Glucose, Bld: 141 mg/dL — ABNORMAL HIGH (ref 70–99)
Potassium: 3.9 mmol/L (ref 3.5–5.1)
Potassium: 4.5 mmol/L (ref 3.5–5.1)
Sodium: 139 mmol/L (ref 135–145)
Sodium: 140 mmol/L (ref 135–145)

## 2019-08-05 LAB — CBC
HCT: 35.3 % — ABNORMAL LOW (ref 39.0–52.0)
HCT: 35.2 % — ABNORMAL LOW (ref 39.0–52.0)
Hemoglobin: 11.4 g/dL — ABNORMAL LOW (ref 13.0–17.0)
Hemoglobin: 11.2 g/dL — ABNORMAL LOW (ref 13.0–17.0)
MCH: 27.9 pg (ref 26.0–34.0)
MCH: 27.9 pg (ref 26.0–34.0)
MCHC: 32.3 g/dL (ref 30.0–36.0)
MCHC: 31.8 g/dL (ref 30.0–36.0)
MCV: 86.3 fL (ref 80.0–100.0)
MCV: 87.8 fL (ref 80.0–100.0)
Platelets: 75 10*3/uL — ABNORMAL LOW (ref 150–400)
Platelets: 70 10*3/uL — ABNORMAL LOW (ref 150–400)
RBC: 4.09 MIL/uL — ABNORMAL LOW (ref 4.22–5.81)
RBC: 4.01 MIL/uL — ABNORMAL LOW (ref 4.22–5.81)
RDW: 13.8 % (ref 11.5–15.5)
RDW: 14.3 % (ref 11.5–15.5)
WBC: 12.5 10*3/uL — ABNORMAL HIGH (ref 4.0–10.5)
WBC: 13.4 10*3/uL — ABNORMAL HIGH (ref 4.0–10.5)
nRBC: 0 % (ref 0.0–0.2)
nRBC: 0 % (ref 0.0–0.2)

## 2019-08-05 LAB — GLUCOSE, CAPILLARY
Glucose-Capillary: 106 mg/dL — ABNORMAL HIGH (ref 70–99)
Glucose-Capillary: 121 mg/dL — ABNORMAL HIGH (ref 70–99)
Glucose-Capillary: 136 mg/dL — ABNORMAL HIGH (ref 70–99)
Glucose-Capillary: 136 mg/dL — ABNORMAL HIGH (ref 70–99)
Glucose-Capillary: 141 mg/dL — ABNORMAL HIGH (ref 70–99)
Glucose-Capillary: 142 mg/dL — ABNORMAL HIGH (ref 70–99)
Glucose-Capillary: 176 mg/dL — ABNORMAL HIGH (ref 70–99)

## 2019-08-05 LAB — PROCALCITONIN: Procalcitonin: <0.1 ng/mL

## 2019-08-05 LAB — FIBRINOGEN: Fibrinogen: 147 mg/dL — ABNORMAL LOW (ref 210–475)

## 2019-08-05 LAB — HEPATIC FUNCTION PANEL
ALT: 19 U/L (ref 0–44)
AST: 22 U/L (ref 15–41)
Albumin: 2.7 g/dL — ABNORMAL LOW (ref 3.5–5.0)
Alkaline Phosphatase: 46 U/L (ref 38–126)
Bilirubin, Direct: 0.2 mg/dL (ref 0.0–0.2)
Indirect Bilirubin: 0.8 mg/dL (ref 0.3–0.9)
Total Bilirubin: 1 mg/dL (ref 0.3–1.2)
Total Protein: 4.5 g/dL — ABNORMAL LOW (ref 6.5–8.1)

## 2019-08-05 LAB — PROTIME-INR
INR: 1.4 — ABNORMAL HIGH (ref 0.8–1.2)
Prothrombin Time: 16.9 seconds — ABNORMAL HIGH (ref 11.4–15.2)

## 2019-08-05 LAB — COOXEMETRY PANEL
Carboxyhemoglobin: 1.7 % — ABNORMAL HIGH (ref 0.5–1.5)
Methemoglobin: 0.8 % (ref 0.0–1.5)
O2 Saturation: 93.3 %
Total hemoglobin: 11.1 g/dL — ABNORMAL LOW (ref 12.0–16.0)

## 2019-08-05 LAB — HEPARIN LEVEL (UNFRACTIONATED)
Heparin Unfractionated: 0.49 IU/mL (ref 0.30–0.70)
Heparin Unfractionated: 0.47 IU/mL (ref 0.30–0.70)
Heparin Unfractionated: 0.64 IU/mL (ref 0.30–0.70)

## 2019-08-05 LAB — APTT
aPTT: 53 seconds — ABNORMAL HIGH (ref 24–36)
aPTT: 57 seconds — ABNORMAL HIGH (ref 24–36)
aPTT: 64 seconds — ABNORMAL HIGH (ref 24–36)

## 2019-08-05 LAB — LACTATE DEHYDROGENASE: LDH: 562 U/L — ABNORMAL HIGH (ref 98–192)

## 2019-08-05 LAB — LACTIC ACID, PLASMA: Lactic Acid, Venous: 1.5 mmol/L (ref 0.5–1.9)

## 2019-08-05 MED ORDER — SODIUM CHLORIDE 0.9 % IV SOLN
1.0000 mg/kg/h | INTRAVENOUS | Status: DC
  Administered 2019-08-05 – 2019-08-06 (×6): 1 mg/kg/h via INTRAVENOUS
  Filled 2019-08-05 (×15): qty 5

## 2019-08-05 MED ORDER — FUROSEMIDE 10 MG/ML IJ SOLN
40.0000 mg | Freq: Two times a day (BID) | INTRAMUSCULAR | Status: DC
  Administered 2019-08-05: 40 mg via INTRAVENOUS
  Filled 2019-08-05: qty 4

## 2019-08-05 MED ORDER — POTASSIUM CHLORIDE 20 MEQ/15ML (10%) PO SOLN
20.0000 meq | Freq: Once | ORAL | Status: AC
  Administered 2019-08-05: 20 meq
  Filled 2019-08-05: qty 15

## 2019-08-05 MED ORDER — VECURONIUM BROMIDE 10 MG IV SOLR
10.0000 mg | Freq: Once | INTRAVENOUS | Status: AC
  Administered 2019-08-06: 10 mg via INTRAVENOUS
  Filled 2019-08-05: qty 10

## 2019-08-05 MED ORDER — FENTANYL CITRATE (PF) 100 MCG/2ML IJ SOLN
200.0000 ug | Freq: Once | INTRAMUSCULAR | Status: AC
  Administered 2019-08-06: 200 ug via INTRAVENOUS
  Filled 2019-08-05: qty 4

## 2019-08-05 MED ORDER — ALBUMIN HUMAN 5 % IV SOLN
12.5000 g | Freq: Once | INTRAVENOUS | Status: AC
  Administered 2019-08-05: 12.5 g via INTRAVENOUS

## 2019-08-05 MED ORDER — MIDAZOLAM HCL 2 MG/2ML IJ SOLN
5.0000 mg | Freq: Once | INTRAMUSCULAR | Status: AC
  Administered 2019-08-06: 5 mg via INTRAVENOUS
  Filled 2019-08-05: qty 6

## 2019-08-05 MED ORDER — PIVOT 1.5 CAL PO LIQD
1000.0000 mL | ORAL | Status: DC
  Administered 2019-08-07 – 2019-08-14 (×7): 1000 mL
  Filled 2019-08-05 (×17): qty 1000

## 2019-08-05 MED ORDER — ETOMIDATE 2 MG/ML IV SOLN
40.0000 mg | Freq: Once | INTRAVENOUS | Status: AC
  Administered 2019-08-06: 25 mg via INTRAVENOUS
  Filled 2019-08-05: qty 20

## 2019-08-05 NOTE — Progress Notes (Signed)
Dr. [name] notified of need for x2 albumin this AM after 40 of lasix due to ecmo flows dropping.. Aware of upcoming 1800 dose and ordered to discontinue evening dose for now.

## 2019-08-05 NOTE — Plan of Care (Signed)
  Problem: Education: Goal: Knowledge of risk factors and measures for prevention of condition will improve Outcome: Not Progressing   Problem: Activity: Goal: Ability to tolerate increased activity will improve Outcome: Not Applicable   Problem: Respiratory: Goal: Ability to maintain a clear airway and adequate ventilation will improve Outcome: Progressing

## 2019-08-05 NOTE — Progress Notes (Signed)
Patient ID: [name], male   DOB: 23-Aug-1975, 43 y.o.   MRN: [id number]    Advanced Heart Failure Rounding Note   Subjective:    Stable on VV ecmo with good flows and oxygen saturation 95%. Adjusting Vt this morning. On Vasopressin 0.03. MAP 100s. Lactate normal. On Nimbex, fentanyl, Versed.   I/Os positive, creatinine stable 0.68. CVP 15.   Platelets stable at 76, LDH 562.    Afebrile, remains on vancomycin/cefepime with WBCs 12.5.   On vent 40% PEEP 10. Bilateral CTs in for PTXs  ECMO parameters Flow 4.9 RPM 3400 dP 18 Pressure -91   Objective:   Weight Range:  Vital Signs:   Temp:  [97.9 F (36.6 C)-98.7 F (37.1 C)] 98.5 F (36.9 C) (12/28 0400) Pulse Rate:  [64-90] 64 (12/28 0700) Resp:  [0-20] 5 (12/28 0700) BP: (101-140)/(40-62) 123/59 (12/28 0400) SpO2:  [90 %-99 %] 99 % (12/28 0700) Arterial Line BP: (114-182)/(42-69) 149/62 (12/28 0700) FiO2 (%):  [40 %] 40 % (12/28 0400) Weight:  [128.2 kg] 128.2 kg (12/28 0500) Last BM Date: (PTA)  Weight change: Filed Weights   08/03/19 2100 08/04/19 0600 08/05/19 0500  Weight: 121.8 kg 127.6 kg 128.2 kg    Intake/Output:   Intake/Output Summary (Last 24 hours) at 08/05/2019 0722 Last data filed at 08/05/2019 0616 Gross per 24 hour  Intake 4159.01 ml  Output 1420 ml  Net 2739.01 ml     Physical Exam: General: Intubated/sedated.  Neck: Thick, JVP difficult, no thyromegaly or thyroid nodule.  Lungs: Decreased bilaterally.  CV: Nondisplaced PMI.  Heart regular S1/S2, no S3/S4, no murmur.  1+ ankle edema.  Bilateral chest wall subcutaneous emphysema.  Abdomen: Soft, nontender, no hepatosplenomegaly, mild distention.  Skin: Intact without lesions or rashes.  Neurologic: Sedated on vent.   Extremities: No clubbing or cyanosis.  HEENT: Normal.   Telemetry: Sinus 70-80s Personally reviewed  Labs: Basic Metabolic Panel: Recent Labs  Lab 08/02/19 0655 08/03/19 0200 08/03/19 1040 08/03/19 2108  08/03/19 2131 08/04/19 0507 08/04/19 1644 08/04/19 1651 08/04/19 1834 08/04/19 2102 08/05/19 0416 08/05/19 0426  NA 137 138  --  138  --  138 136 140 140 144 139 141  K 4.0 4.1  --  5.0  --  4.4 4.7 4.7 4.6 4.9 3.9 3.9  CL 100 102  --  110  --  108 108  --   --   --  111  --   CO2 24 25  --  21*  --  23 24  --   --   --  22  --   GLUCOSE 84 94  --  125*  --  132* 167*  --   --   --  136*  --   BUN 35* 36*  --  39*  --  35* 39*  --   --   --  41*  --   CREATININE 0.91 0.88  --  1.12  --  0.77 0.74  --   --   --  0.68  --   CALCIUM 8.6* 8.8*  --  7.2*  --  7.6* 7.5*  --   --   --  7.8*  --   MG 2.1  --  2.4  --   --   --  2.8*  --   --   --   --   --   PHOS 4.3  --  7.1*  --  4.2  --  2.7  --   --   --   --   --  Liver Function Tests: Recent Labs  Lab 08/02/19 0655 08/03/19 0200 08/03/19 2108 08/04/19 0507 08/05/19 0416  AST 31 42* 27 26 22   ALT 25 29 21 20 19   ALKPHOS 75 104 77 52 46  BILITOT 0.9 1.2 0.8 1.3* 1.0  PROT 6.2* 6.7 4.6* 4.6* 4.5*  ALBUMIN 3.2* 3.4* 2.4* 2.7* 2.7*   No results for input(s): LIPASE, AMYLASE in the last 168 hours. No results for input(s): AMMONIA in the last 168 hours.  CBC: Recent Labs  Lab 08/02/19 0655 08/03/19 0946 08/04/19 0003 08/04/19 0200 08/04/19 0507 08/04/19 1644 08/04/19 1651 08/04/19 1834 08/04/19 2102 08/05/19 0416 08/05/19 0426  WBC 15.2*   < > 17.6* 16.8* 14.2* 13.3*  --   --   --  12.5*  --   NEUTROABS 11.7*  --   --   --   --   --   --   --   --   --   --   HGB 16.4  --  11.9*  11.9* 12.6* 11.8* 12.0* 11.9* 11.6* 11.9* 11.4* 11.2*  HCT 50.4  --  35.0*  37.7* 38.2* 36.9* 38.0* 35.0* 34.0* 35.0* 35.3* 33.0*  MCV 84.3   < > 86.9 86.0 86.4 88.6  --   --   --  86.3  --   PLT 185   < > 94* 91* 82* 76*  --   --   --  75*  --    < > = values in this interval not displayed.    Cardiac Enzymes: No results for input(s): CKTOTAL, CKMB, CKMBINDEX, TROPONINI in the last 168 hours.  BNP: BNP (last 3 results) Recent  Labs    07/22/19 1039  BNP 15.3    ProBNP (last 3 results) No results for input(s): PROBNP in the last 8760 hours.    Other results:  Imaging: DG Abd 1 View  Result Date: 08/03/2019 CLINICAL DATA:  NG tube placement EXAM: ABDOMEN - 1 VIEW COMPARISON:  None. FINDINGS: 0948 hours. NG tube tip is in the distal stomach near the pylorus. Bowel gas pattern is nonobstructive. Visualized bony anatomy unremarkable. Subcutaneous emphysema noted lower right chest wall. IMPRESSION: NG tube tip is positioned in the distal stomach. Electronically Signed   By: Misty Stanley M.D.   On: 08/03/2019 10:27   DG CHEST PORT 1 VIEW  Result Date: 08/05/2019 CLINICAL DATA:  Endotracheal tube. EXAM: PORTABLE CHEST 1 VIEW COMPARISON:  August 04, 2019. FINDINGS: Stable cardiomediastinal silhouette. Endotracheal and nasogastric tubes are unchanged in position. Bilateral chest tubes are noted without pneumothorax. Stable bilateral lung opacities are noted concerning for pneumonia. Bony thorax is unremarkable. IMPRESSION: Stable support apparatus. Stable bilateral lung opacities are noted concerning for pneumonia. No pneumothorax is noted. Bilateral chest tubes are unchanged without pneumothorax. Electronically Signed   By: Marijo Conception M.D.   On: 08/05/2019 07:18   DG Chest Port 1 View  Result Date: 08/04/2019 CLINICAL DATA:  Cardiorespiratory status. EXAM: PORTABLE CHEST 1 VIEW COMPARISON:  08/03/2019 FINDINGS: Endotracheal tube has tip 4.7 cm above the carina. Enteric tube courses into the stomach and off the film as tip is not visualized. Left IJ central venous catheter is unchanged with tip over the SVC. Bilateral chest tubes unchanged. Large bore catheter extends from below projecting over the right heart unchanged. Lungs are adequately inflated demonstrate stable moderate bilateral patchy airspace consolidation. Possible small amount right pleural fluid. Stable cardiomegaly. Moderate persistent subcutaneous  emphysema over the chest. Remainder the exam is  unchanged. IMPRESSION: 1. Stable moderate bilateral airspace consolidation likely multifocal pneumonia. Possible small amount right pleural fluid. Stable subcutaneous emphysema over the chest. 2.  Tubes and lines as described. Electronically Signed   By: Marin Olp M.D.   On: 08/04/2019 08:26   DG Chest Port 1 View  Result Date: 08/03/2019 CLINICAL DATA:  43 year old male with acute respiratory failure and hypoxia. EXAM: PORTABLE CHEST 1 VIEW COMPARISON:  Earlier radiograph dated 08/03/2019. FINDINGS: Evaluation is very limited due to extensive subcutaneous emphysema. Endotracheal tube with tip approximately 3 cm above the carina, enteric tube extending inferior to the diaphragm with tip beyond the inferior margin of the image, bilateral chest tubes and left IJ central venous line in similar position. Partially visualized inferiorly accessed tubes with tip of the more proximal tube close to the right atrium/SVC junction and the tip of the more inferior tube at the junction of the right atrium/IVC. Clinical correlation is recommended. Bilateral airspace opacities similar to prior radiograph. Stable cardiomediastinal silhouette. No acute osseous pathology. IMPRESSION: 1. No significant interval change in the appearance of the lungs compared to the earlier radiograph. 2. Support lines and tubes in similar position. Interval placement of two inferiorly accessed central venous lines. Clinical correlation is recommended. 3. Extensive subcutaneous emphysema similar to prior radiograph. Electronically Signed   By: Anner Crete M.D.   On: 08/03/2019 22:12   DG Chest Port 1 View  Result Date: 08/03/2019 CLINICAL DATA:  COVID-19 positive. ARDS. EXAM: PORTABLE CHEST 1 VIEW COMPARISON:  August 03, 2019 FINDINGS: Extensive subcutaneous emphysema is identified over bilateral chest. Bilateral chest tubes, endotracheal tube are identified. Feeding tube is identified  with distal tip not included on film but is at least in the stomach. Diffuse patchy consolidation of bilateral lungs are identified unchanged. IMPRESSION: 1. Extensive subcutaneous emphysema is identified over bilateral chest. 2. Bilateral chest tubes are identified. 3. Diffuse patchy consolidation of bilateral lungs are identified unchanged. Electronically Signed   By: Abelardo Diesel M.D.   On: 08/03/2019 10:37   DG CHEST PORT 1 VIEW  Result Date: 08/03/2019 CLINICAL DATA:  Intubation. EXAM: PORTABLE CHEST 1 VIEW COMPARISON:  08/02/2019 FINDINGS: Endotracheal tube has tip 8.4 cm above the carina. Lungs are hypoinflated demonstrate a stable small left-sided pneumothorax. Previously seen small right apical pneumothorax is not well visualized on the current exam. Persistent moderate bilateral airspace opacification without significant change. Cardiomediastinal silhouette is within normal. Persistent moderate subcutaneous emphysema over the neck and chest. IMPRESSION: 1. Moderate bilateral persistent airspace process without significant change likely multifocal infection. Stable subcutaneous emphysema over the neck and chest. 2. Previously seen small right apical pneumothorax not visualized on the current exam. Stable small left pneumothorax. 3.  Endotracheal tube with tip 8.4 cm above the carina. Electronically Signed   By: Marin Olp M.D.   On: 08/03/2019 08:02     Medications:     Scheduled Medications: . artificial tears  1 application Both Eyes [id number]  . vitamin C  500 mg Per Tube Daily  . chlorhexidine gluconate (MEDLINE KIT)  15 mL Mouth Rinse BID  . chlorhexidine gluconate (MEDLINE KIT)  15 mL Mouth Rinse BID  . Chlorhexidine Gluconate Cloth  6 each Topical Daily  . dexamethasone (DECADRON) injection  6 mg Intravenous Q24H  . feeding supplement (PRO-STAT SUGAR FREE 64)  30 mL Per Tube BID  . feeding supplement (VITAL HIGH PROTEIN)  1,000 mL Per Tube Q24H  . furosemide  40 mg Intravenous BID   .  insulin aspart  1 Units Subcutaneous Q4H  . insulin aspart  3-9 Units Subcutaneous Q4H  . insulin detemir  5 Units Subcutaneous Q12H  . mouth rinse  15 mL Mouth Rinse BID  . mouth rinse  15 mL Mouth Rinse 10 times per day  . pantoprazole (PROTONIX) IV  40 mg Intravenous QHS  . sodium chloride flush  10-40 mL Intracatheter Q12H  . zinc sulfate  220 mg Per Tube Daily    Infusions: . sodium chloride    . sodium chloride 10 mL/hr at 08/05/19 0616  . albumin human Stopped (08/04/19 0346)  . ceFEPime (MAXIPIME) IV Stopped (08/05/19 0544)  . cisatracurium (NIMBEX) infusion 4 mcg/kg/min (08/05/19 0616)  . dextrose    . fentaNYL infusion INTRAVENOUS 300 mcg/hr (08/05/19 0616)  . heparin 1,600 Units/hr (08/05/19 2423)  . midazolam 10 mg/hr (08/05/19 0616)  . norepinephrine (LEVOPHED) Adult infusion Stopped (08/03/19 2100)  . vancomycin Stopped (08/04/19 2230)    PRN Medications: Place/Maintain arterial line **AND** sodium chloride, sodium chloride, acetaminophen, albumin human, dextrose, fentaNYL, fentaNYL, metoprolol tartrate, midazolam, ondansetron (ZOFRAN) IV, sodium chloride, sodium chloride flush   Assessment/Plan:    1. Acute hypoxic/hypercarbic respiratory failure due to COVID PNA: Remained markedly hypoxic despite full support. Intubated 12/26. S/p bilateral CTs 12/26 for pneumothoraces.  TEE on 12/26 LVEF 70% RV ok. VV ECMO begun 12/26. Day #3 VV ECMO. Flows stable. Oxygenation improved - Can stop vasopressin with high MAP today.  - Heparin slightly subtherapeutic this morning, will adjust. - I/Os very positive with rising CVP, will give Lasix 40 mg IV bid.  2. Bilateral PTX: Due to barotrauma. CTs in place 3. COVID PNA: management of COVID infection per CCM. CXR with bilateral multifocal PNA.  - s/p 10 days of remdesivir - 12/16, 12/2 s/p tociluzimab - 12/17 s/p convalescent plasma - On decadron - On vanc & cefepime while on ECMO (started 12/26).  4. ID: Empiric  coverage with vancomycin/cefepime for possible secondary bacterial PNA.   CRITICAL CARE Performed by: Loralie Champagne  Total critical care time: 35 minutes  Critical care time was exclusive of separately billable procedures and treating other patients.  Critical care was necessary to treat or prevent imminent or life-threatening deterioration.  Critical care was time spent personally by me (independent of midlevel providers or residents) on the following activities: development of treatment plan with patient and/or surrogate as well as nursing, discussions with consultants, evaluation of patient's response to treatment, examination of patient, obtaining history from patient or surrogate, ordering and performing treatments and interventions, ordering and review of laboratory studies, ordering and review of radiographic studies, pulse oximetry and re-evaluation of patient's condition.    Length of Stay: 14   Loralie Champagne MD 08/05/2019, 7:22 AM  Advanced Heart Failure Team Pager [phone] (M-F; [place])  Please contact [facility] for night-coverage after hours (4p -7a ) and weekends on [email or web address]

## 2019-08-05 NOTE — Progress Notes (Signed)
Called and updated wife. Discussed risks/benefits of tracheostomy. She is agreeable to proceed.  Will do tomorrow and hold heparin drip for about 1 hour.  Erskine Emery MD PCCM

## 2019-08-05 NOTE — Progress Notes (Signed)
ANTICOAGULATION CONSULT NOTE - Follow Up Consult  Pharmacy Consult for heparin Indication: ECMO  Labs: Recent Labs    08/03/19 2108 08/03/19 2222 08/04/19 0507 08/04/19 0512 08/04/19 1310 08/04/19 1644 08/04/19 2000 08/04/19 2102 08/05/19 0416 08/05/19 0426  HGB 9.8*  --  11.8*  --   --  12.0*  --  11.9* 11.4* 11.2*  HCT 31.1*  --  36.9*  --   --  38.0*  --  35.0* 35.3* 33.0*  PLT 74*   < > 82*  --   --  76*  --   --  75*  --   APTT >200*   < >  --    < > 67*  --  52*  --  53*  --   LABPROT 22.3*  --  18.6*  --   --   --   --   --  16.9*  --   INR 2.0*  --  1.6*  --   --   --   --   --  1.4*  --   HEPARINUNFRC 1.32*   < >  --    < > 0.71*  --  0.51  --  0.49  --   CREATININE 1.12  --  0.77  --   --  0.74  --   --  0.68  --    < > = values in this interval not displayed.    Assessment: 43yo male now slightly subtherapeutic on heparin despite rate increase; no gtt issues per RN though she does report some blood via chest tube, seems to have resolved.  Goal of Therapy:  Heparin level 0.5-0.7 units/ml   Plan:  Will increase heparin gtt by 1 unit/kg/hr to 1600 units/hr and check level with next scheduled lab draw in [place], PharmD, BCPS  08/05/2019,6:15 AM

## 2019-08-05 NOTE — Progress Notes (Signed)
Vent settings made per Dr. Erskine Emery.

## 2019-08-05 NOTE — Progress Notes (Addendum)
Initial Nutrition Assessment  DOCUMENTATION CODES:   Obesity unspecified  INTERVENTION:   Finish liter bottle of Vital High Protein @ 40 ml/hr then transition to:   -Pivot 1.5 @ 40 ml/hr via OGT -Increase by 10 ml Q6 hours to goal rate of 65 ml/hr (1560 ml) -30 ml Prostat BID  At goal rate TF provides: 2540 kcals, 176 grams protein, 1186 ml free water. Meets 100% of needs.   NUTRITION DIAGNOSIS:   Increased nutrient needs related to acute illness(COVID PNA) as evidenced by estimated needs.  GOAL:   Patient will meet greater than or equal to 90% of their needs  MONITOR:   Diet advancement, Skin, TF tolerance, Weight trends, Labs, I & O's  REASON FOR ASSESSMENT:   Consult Enteral/tube feeding initiation and management  ASSESSMENT:   Patient with PMH significant for GERD, HTN, and asthma. Presents this admission with COVID-19 PNA.   12/14- admit 12/23- chest xray revealed pneumomediastinum  12/24- developed bilateral pneumothoraces  12/26- worsening hypoxemia, intubated  12/27- VV fem/fem ECMO cannulation   Pt discussed during ICU rounds and with RN.   Continues on ECMO and nimbex. Off pressors. Finished remdesivir, remains on steroids. Tolerating Vital High Protein @ 40 ml/hr via OGT. Plan to finish new bottle and transition to Pivot 1.5 to better meet needs. Will likely need Cortrak in future.  Weight shows to decline this admission. Pt continues to diurese. Utilize 121.8 kg to estimate needs.   Patient is currently intubated on ventilator support MV: 7.2 L/min Temp (24hrs), Avg:98.4 F (36.9 C), Min:98.1 F (36.7 C), Max:98.7 F (37.1 C)    I/O: +86 ml since admit UOP: 1,320 ml x 24 hrs  Chest tubes: 100 ml x 24 hrs   Drips: nimbex, fentanyl, versed  Medications: 500 mg Vitamin C daily, decadron, 40 mg lasix BID, SS novolog, 220 mg zinc sulfate daily  Labs: CBG 84-176  Diet Order:   Diet Order            Diet NPO time specified  Diet effective now               EDUCATION NEEDS:   Not appropriate for education at this time  Skin:  Skin Assessment: Reviewed RN Assessment  Last BM:  PTA  Height:   Ht Readings from Last 1 Encounters:  08/05/19 5' 8"  (1.727 m)    Weight:   Wt Readings from Last 1 Encounters:  08/05/19 128.2 kg    Ideal Body Weight:  70 kg  BMI:  Body mass index is 42.97 kg/m.  Estimated Nutritional Needs:   Kcal:  1219-7588 kcal (20-24 kcal/kg)  Protein:  140-175 grams  Fluid:  >/= 2 L/day   [name] RD, LDN Clinical Nutrition Pager # - [phone]

## 2019-08-05 NOTE — Progress Notes (Signed)
NAME:  [name], MRN:  [id number], DOB:  Feb 20, 1976, LOS: 38 ADMISSION DATE:  07/22/2019, CONSULTATION DATE:  12/24 REFERRING MD:  Sloan Leiter, CHIEF COMPLAINT:  Dyspnea   Brief History   [name] admitted 12/14 with COVID pneumonia causing acute hypoxemic respiratory failure.   Developed pneumomediastinum 12/23 with concern for possible bilateral small pneumothoraces on 12/24.  PCCM consulted for evaluation of pneumothoraces  Past Medical History  GERD HTN Asthma  Significant Hospital Events   12/14 admit with hypoxemic respiratory failure in setting of COVID-19 pneumonia 12/23 noted to have pneumomediastinum on chest x-ray 12/24 PCCM consulted for evaluation 12/26 worsening hypoxemia, intubated  12/27 VV fem/fem ECMO cannulation 12/28 ETT switch  Consults:  PCCM  Procedures:    Significant Diagnostic Tests:  CT chest 12/22 >> extensive pneumomediastinum, multifocal patchy bilateral groundglass opacitiesCT chest 12/22 >> extensive pneumomediastinum, multifocal patchy bilateral groundglass opacities  Micro Data:  BCx2 12/14 >> negative  Sputum 12/15 >> negative   Antimicrobials:  Received remdesivir, steroids, Actemra and convalescent plasma   Interim history/subjective:  Good flows on circuit. Patient heavily sedated and on paralytic drip. Issues with ventilation, see A/P  Objective   Blood pressure (!) 159/42, pulse 89, temperature 98.2 F (36.8 C), temperature source Oral, resp. rate (!) 21, height 5' 8"  (1.727 m), weight 128.2 kg, SpO2 91 %. CVP:  [6 mmHg-16 mmHg] 9 mmHg  Vent Mode: PCV FiO2 (%):  [40 %] 40 % Set Rate:  [20 bmp] 20 bmp Vt Set:  [270 mL] 270 mL PEEP:  [10 cmH20] 10 cmH20 Plateau Pressure:  [20 cmH20-36 cmH20] 22 cmH20   Intake/Output Summary (Last 24 hours) at 08/05/2019 1001 Last data filed at 08/05/2019 0900 Gross per 24 hour  Intake 4411.37 ml  Output 2425 ml  Net 1986.37 ml   Filed Weights   08/03/19 2100 08/04/19 0600 08/05/19  0500  Weight: 121.8 kg 127.6 kg 128.2 kg    Examination:  GEN: ill appearing man on vent HEENT: HEENT CV: RRR, ext warm PULM: Severely diminished bilaterally GI: Soft, hypoactive BS EXT: Global anasarca, venous sheaths look okay NEURO: Sedated/paralyzed PSYCH: BIS 50 SKIN: Mild paleness  abg looks good, sweep reduced plts low but stable H/H stable Chemistries and LFTs look okay LDH 562 Fibrinogen 147 Heparin 0.49  Resolved Hospital Problem list     Assessment & Plan:  # Acute Hypoxemic Respiratory Failure secondary to COVID- finished remdesivir, got actemra, activated plasma, on steroids.  Inability to ventilate/oxygenate leading to VV ECMO cannulation 08/04/19. # Bilateral pneumothoraces and pneumomediastinum # Morbid obesity # Asthma # Stress hyperglycemia # Thrombocytopenia and low grade hemolysis stable  - Continue chest tubes to suction - Fine to tolerate severely reduced TV on vent, discussed with RT, limit driving pressure to 15 cmH2O, turn off vent alarms if needed - Continue sedation as ordered: fentanyl/versed titrated to BIS < 50 - Vanc/cefepime fine for now, check sputum/blood cultures, Pct, if unrevealing will likely just D/C  - Monitor Plts - DC low dose levemir, can do TF coverage PRN - Not sure what to do with decadron for languishing COVID ARDS, continue for now, may try to wean off depending on clinical trejectory - Diuretic trial sounds good - Would be ideal to prone but not sure we can figure out positioning cannula - As lungs recover, PC should start to increase MV, may need bronchoscopy to help facilitate this in future - Guarded prognosis  Best practice:   Feeding: TF Analgesia: see  above Sedation: propofol infusion, prn versed Thromboprophylaxis: enoxaparin HOB >30 degrees Ulcer prophylaxis: pantoprazole Glucose control: SSI   Labs   CBC: Recent Labs  Lab 08/02/19 0655 08/03/19 0946 08/04/19 0003 08/04/19 0200 08/04/19 0507  08/04/19 1644 08/04/19 1651 08/04/19 1834 08/04/19 2102 08/05/19 0416 08/05/19 0426  WBC 15.2*   < > 17.6* 16.8* 14.2* 13.3*  --   --   --  12.5*  --   NEUTROABS 11.7*  --   --   --   --   --   --   --   --   --   --   HGB 16.4  --  11.9*  11.9* 12.6* 11.8* 12.0* 11.9* 11.6* 11.9* 11.4* 11.2*  HCT 50.4  --  35.0*  37.7* 38.2* 36.9* 38.0* 35.0* 34.0* 35.0* 35.3* 33.0*  MCV 84.3   < > 86.9 86.0 86.4 88.6  --   --   --  86.3  --   PLT 185   < > 94* 91* 82* 76*  --   --   --  75*  --    < > = values in this interval not displayed.    Basic Metabolic Panel: Recent Labs  Lab 08/02/19 0655 08/03/19 0200 08/03/19 1040 08/03/19 2108 08/03/19 2131 08/04/19 0507 08/04/19 1644 08/04/19 1651 08/04/19 1834 08/04/19 2102 08/05/19 0416 08/05/19 0426  NA 137 138  --  138  --  138 136 140 140 144 139 141  K 4.0 4.1  --  5.0  --  4.4 4.7 4.7 4.6 4.9 3.9 3.9  CL 100 102  --  110  --  108 108  --   --   --  111  --   CO2 24 25  --  21*  --  23 24  --   --   --  22  --   GLUCOSE 84 94  --  125*  --  132* 167*  --   --   --  136*  --   BUN 35* 36*  --  39*  --  35* 39*  --   --   --  41*  --   CREATININE 0.91 0.88  --  1.12  --  0.77 0.74  --   --   --  0.68  --   CALCIUM 8.6* 8.8*  --  7.2*  --  7.6* 7.5*  --   --   --  7.8*  --   MG 2.1  --  2.4  --   --   --  2.8*  --   --   --   --   --   PHOS 4.3  --  7.1*  --  4.2  --  2.7  --   --   --   --   --    GFR: Estimated Creatinine Clearance: 152.2 mL/min (by C-G formula based on SCr of 0.68 mg/dL). Recent Labs  Lab 07/30/19 0300 07/31/19 0300 08/03/19 2108 08/04/19 0200 08/04/19 0507 08/04/19 1644 08/05/19 0416  PROCALCITON <0.10 <0.10  --   --   --   --   --   WBC  --   --  16.0* 16.8* 14.2* 13.3* 12.5*  LATICACIDVEN  --   --  2.1* 1.5 1.4  --  1.5    Liver Function Tests: Recent Labs  Lab 08/02/19 0655 08/03/19 0200 08/03/19 2108 08/04/19 0507 08/05/19 0416  AST 31 42* 27 26 22   ALT 25  29 21 20 19   ALKPHOS 75 104 77  52 46  BILITOT 0.9 1.2 0.8 1.3* 1.0  PROT 6.2* 6.7 4.6* 4.6* 4.5*  ALBUMIN 3.2* 3.4* 2.4* 2.7* 2.7*   No results for input(s): LIPASE, AMYLASE in the last 168 hours. No results for input(s): AMMONIA in the last 168 hours.  ABG    Component Value Date/Time   PHART 7.428 08/05/2019 0426   PCO2ART 34.5 08/05/2019 0426   PO2ART 53.0 (L) 08/05/2019 0426   HCO3 22.8 08/05/2019 0426   TCO2 24 08/05/2019 0426   ACIDBASEDEF 1.0 08/05/2019 0426   O2SAT 93.3 08/05/2019 0825     Coagulation Profile: Recent Labs  Lab 08/03/19 2108 08/04/19 0507 08/05/19 0416  INR 2.0* 1.6* 1.4*    Cardiac Enzymes: No results for input(s): CKTOTAL, CKMB, CKMBINDEX, TROPONINI in the last 168 hours.  HbA1C: Hgb A1c MFr Bld  Date/Time Value Ref Range Status  07/24/2019 04:50 AM 6.7 (H) 4.8 - 5.6 % Final    Comment:    (NOTE) Pre diabetes:          5.7%-6.4% Diabetes:              >6.4% Glycemic control for   <7.0% adults with diabetes   09/30/2017 05:36 AM 5.7 (H) 4.8 - 5.6 % Final    Comment:    (NOTE)         Prediabetes: 5.7 - 6.4         Diabetes: >6.4         Glycemic control for adults with diabetes: <7.0     CBG: Recent Labs  Lab 08/04/19 1649 08/04/19 1942 08/05/19 0006 08/05/19 0424 08/05/19 0832  GLUCAP 155* 176* 136* 136* 106*     Critical care time: 94 minutes     Erskine Emery MD PCCM

## 2019-08-05 NOTE — Progress Notes (Signed)
ANTICOAGULATION CONSULT NOTE -  Pharmacy Consult for heparin Indication: ECMO  No Known Allergies  Patient Measurements: Height: _0  (172.7 cm) Weight: 282 lb 10.1 oz (128.2 kg)(Simultaneous filing. User may not have seen previous data.) IBW/kg (Calculated) : 68.4 Heparin Dosing Weight: 100.7 kg  Vital Signs: Temp: 98.2 F (36.8 C) (12/28 0800) Temp Source: Oral (12/28 0800) BP: 119/56 (12/28 1200) Pulse Rate: 80 (12/28 1200)  Labs: Recent Labs    08/03/19 2108 08/03/19 2222 08/04/19 0507 08/04/19 0512 08/04/19 1644 08/04/19 2000 08/05/19 0416 08/05/19 0426 08/05/19 0827 08/05/19 1014 08/05/19 1104  HGB 9.8*  --  11.8*  --  12.0*  --  11.4* 11.2* 11.2* 10.9*  --   HCT 31.1*  --  36.9*  --  38.0*  --  35.3* 33.0* 33.0* 32.0*  --   PLT 74*   < > 82*  --  76*  --  75*  --   --   --   --   APTT >200*   < >  --    < >  --  52* 53*  --   --   --  57*  LABPROT 22.3*  --  18.6*  --   --   --  16.9*  --   --   --   --   INR 2.0*  --  1.6*  --   --   --  1.4*  --   --   --   --   HEPARINUNFRC 1.32*   < >  --    < >  --  0.51 0.49  --   --   --  0.47  CREATININE 1.12  --  0.77  --  0.74  --  0.68  --   --   --   --    < > = values in this interval not displayed.    Estimated Creatinine Clearance: 152.2 mL/min (by C-G formula based on SCr of 0.68 mg/dL).   Medical History: Past Medical History:  Diagnosis Date  . Asthma   . GERD (gastroesophageal reflux disease)   . Headache   . History of kidney stones    LEFT URETERAL STONE  . HTN (hypertension)   . Pancreatitis 2018   GALLBALDDER SLUDGE CAUSED ISSUED RESOLVED    Medications:  Scheduled:  . artificial tears  1 application Both Eyes T6A  . vitamin C  500 mg Per Tube Daily  . chlorhexidine gluconate (MEDLINE KIT)  15 mL Mouth Rinse BID  . chlorhexidine gluconate (MEDLINE KIT)  15 mL Mouth Rinse BID  . Chlorhexidine Gluconate Cloth  6 each Topical Daily  . dexamethasone (DECADRON) injection  6 mg Intravenous  Q24H  . feeding supplement (PRO-STAT SUGAR FREE 64)  30 mL Per Tube BID  . feeding supplement (VITAL HIGH PROTEIN)  1,000 mL Per Tube Q24H  . furosemide  40 mg Intravenous BID  . insulin aspart  1 Units Subcutaneous Q4H  . insulin aspart  3-9 Units Subcutaneous Q4H  . mouth rinse  15 mL Mouth Rinse BID  . mouth rinse  15 mL Mouth Rinse 10 times per day  . pantoprazole (PROTONIX) IV  40 mg Intravenous QHS  . sodium chloride flush  10-40 mL Intracatheter Q12H  . zinc sulfate  220 mg Per Tube Daily    Assessment: 89 yom known COVID+, has been declining with ARDS for past 48 hours requiring intubation and max vent settings. Underwent VV ECMO on 12/26 and  is now on IV heparin.  Heparin level this morning is down to 0.47, aPTT up slightly to 57 seconds. Targeting upper limit of range given VV-ECMO and known COVID.     Goal of Therapy:  Heparin level 0.5-0.7 units/ml >>targeting at higher end of goal range aPTT 66-102 seconds  Monitor platelets by anticoagulation protocol: Yes   Plan:  -Increase heparin to 1750 units/h -Check q6h heparin level and aPTT until therapeutic x2, then change to q12h   Arrie Senate, PharmD, BCPS Clinical Pharmacist [phone] Please check AMION for all [place] numbers 08/05/2019

## 2019-08-05 NOTE — Procedures (Signed)
Intubation Note  Vent turned off Existing 7-5 tube changed out for 8-5 tube No immediate complications CXR showed tube was low, pulled back to appropriate position RT Palmer helped with procedure  Erskine Emery MD PCCM

## 2019-08-05 NOTE — Progress Notes (Signed)
ANTICOAGULATION CONSULT NOTE -  Pharmacy Consult for heparin Indication: ECMO  No Known Allergies  Patient Measurements: Height: 5' 8"  (172.7 cm) Weight: 282 lb 10.1 oz (128.2 kg)(Simultaneous filing. User may not have seen previous data.) IBW/kg (Calculated) : 68.4 Heparin Dosing Weight: 100.7 kg  Vital Signs: Temp: 98.3 F (36.8 C) (12/28 1800) Temp Source: Oral (12/28 1345) BP: 126/57 (12/28 1800) Pulse Rate: 94 (12/28 1845)  Labs: Recent Labs    08/03/19 2108 08/03/19 2222 08/04/19 0507 08/04/19 0512 08/04/19 1644 08/04/19 1651 08/05/19 0416 08/05/19 1014 08/05/19 1104 08/05/19 1622 08/05/19 1822 08/05/19 1830  HGB 9.8*  --  11.8*  --  12.0*  --  11.4* 10.9*  --  11.2* 10.9*  --   HCT 31.1*  --  36.9*  --  38.0*  --  35.3* 32.0*  --  35.2* 32.0*  --   PLT 74*   < > 82*  --  76*  --  75*  --   --  70*  --   --   APTT >200*   < >  --    < >  --    < > 53*  --  57*  --   --  64*  LABPROT 22.3*  --  18.6*  --   --   --  16.9*  --   --   --   --   --   INR 2.0*  --  1.6*  --   --   --  1.4*  --   --   --   --   --   HEPARINUNFRC 1.32*   < >  --    < >  --    < > 0.49  --  0.47  --   --  0.64  CREATININE 1.12  --  0.77  --  0.74  --  0.68  --   --  0.69  --   --    < > = values in this interval not displayed.    Estimated Creatinine Clearance: 152.2 mL/min (by C-G formula based on SCr of 0.69 mg/dL).   Medical History: Past Medical History:  Diagnosis Date  . Asthma   . GERD (gastroesophageal reflux disease)   . Headache   . History of kidney stones    LEFT URETERAL STONE  . HTN (hypertension)   . Pancreatitis 2018   GALLBALDDER SLUDGE CAUSED ISSUED RESOLVED    Medications:  Scheduled:  . artificial tears  1 application Both Eyes E8B  . vitamin C  500 mg Per Tube Daily  . chlorhexidine gluconate (MEDLINE KIT)  15 mL Mouth Rinse BID  . chlorhexidine gluconate (MEDLINE KIT)  15 mL Mouth Rinse BID  . Chlorhexidine Gluconate Cloth  6 each Topical Daily  .  dexamethasone (DECADRON) injection  6 mg Intravenous Q24H  . [START ON 08/06/2019] etomidate  40 mg Intravenous Once  . feeding supplement (PRO-STAT SUGAR FREE 64)  30 mL Per Tube BID  . [START ON 08/06/2019] fentaNYL (SUBLIMAZE) injection  200 mcg Intravenous Once  . insulin aspart  1 Units Subcutaneous Q4H  . insulin aspart  3-9 Units Subcutaneous Q4H  . mouth rinse  15 mL Mouth Rinse BID  . mouth rinse  15 mL Mouth Rinse 10 times per day  . [START ON 08/06/2019] midazolam  5 mg Intravenous Once  . pantoprazole (PROTONIX) IV  40 mg Intravenous QHS  . sodium chloride flush  10-40 mL Intracatheter Q12H  . [  START ON 08/06/2019] vecuronium  10 mg Intravenous Once  . zinc sulfate  220 mg Per Tube Daily    Assessment: 8 yom known COVID+, has been declining with ARDS for past 48 hours requiring intubation and max vent settings. Underwent VV ECMO on 12/26 and is now on IV heparin.  Heparin level this evening is therapeutic at 0.64, aPTT up slightly to 64 seconds. Targeting upper limit of range given VV-ECMO and known COVID. Hgb 10.9, plt 70. No s/sx of bleeding.     Goal of Therapy:  Heparin level 0.5-0.7 units/ml >>targeting at higher end of goal range aPTT 66-102 seconds  Monitor platelets by anticoagulation protocol: Yes   Plan:  -Continue heparin at 1750 units/h -Check q6h heparin level and aPTT until therapeutic x2, then change to q12h -Plan for trach tomorrow - per notes heparin will need held for 1 hour (unsure of timing)  Antonietta Jewel, PharmD, BCCCP Clinical Pharmacist  Phone: [phone]  Please check AMION for all [place] phone numbers After 10:00 PM, call [place] [phone] 08/05/2019

## 2019-08-05 NOTE — Progress Notes (Signed)
ETT changed to an 8.5 by CCM with RT assistance.

## 2019-08-06 ENCOUNTER — Inpatient Hospital Stay (HOSPITAL_COMMUNITY): Payer: Managed Care, Other (non HMO)

## 2019-08-06 LAB — POCT I-STAT 7, (LYTES, BLD GAS, ICA,H+H)
Acid-base deficit: 1 mmol/L (ref 0.0–2.0)
Acid-base deficit: 1 mmol/L (ref 0.0–2.0)
Acid-base deficit: 1 mmol/L (ref 0.0–2.0)
Acid-base deficit: 1 mmol/L (ref 0.0–2.0)
Bicarbonate: 23.6 mmol/L (ref 20.0–28.0)
Bicarbonate: 23.6 mmol/L (ref 20.0–28.0)
Bicarbonate: 24.1 mmol/L (ref 20.0–28.0)
Bicarbonate: 25.4 mmol/L (ref 20.0–28.0)
Bicarbonate: 25.6 mmol/L (ref 20.0–28.0)
Bicarbonate: 24.1 mmol/L (ref 20.0–28.0)
Calcium, Ion: 1.2 mmol/L (ref 1.15–1.40)
Calcium, Ion: 1.22 mmol/L (ref 1.15–1.40)
Calcium, Ion: 1.21 mmol/L (ref 1.15–1.40)
Calcium, Ion: 1.25 mmol/L (ref 1.15–1.40)
Calcium, Ion: 1.23 mmol/L (ref 1.15–1.40)
Calcium, Ion: 1.23 mmol/L (ref 1.15–1.40)
HCT: 29 % — ABNORMAL LOW (ref 39.0–52.0)
HCT: 31 % — ABNORMAL LOW (ref 39.0–52.0)
HCT: 28 % — ABNORMAL LOW (ref 39.0–52.0)
HCT: 30 % — ABNORMAL LOW (ref 39.0–52.0)
HCT: 29 % — ABNORMAL LOW (ref 39.0–52.0)
HCT: 28 % — ABNORMAL LOW (ref 39.0–52.0)
Hemoglobin: 9.9 g/dL — ABNORMAL LOW (ref 13.0–17.0)
Hemoglobin: 10.5 g/dL — ABNORMAL LOW (ref 13.0–17.0)
Hemoglobin: 9.5 g/dL — ABNORMAL LOW (ref 13.0–17.0)
Hemoglobin: 10.2 g/dL — ABNORMAL LOW (ref 13.0–17.0)
Hemoglobin: 9.9 g/dL — ABNORMAL LOW (ref 13.0–17.0)
Hemoglobin: 9.5 g/dL — ABNORMAL LOW (ref 13.0–17.0)
O2 Saturation: 93 %
O2 Saturation: 91 %
O2 Saturation: 96 %
O2 Saturation: 89 %
O2 Saturation: 96 %
O2 Saturation: 97 %
Patient temperature: 98.4
Patient temperature: 98
Patient temperature: 98
Patient temperature: 98.3
Patient temperature: 36.4
Patient temperature: 97.6
Potassium: 4.4 mmol/L (ref 3.5–5.1)
Potassium: 4.2 mmol/L (ref 3.5–5.1)
Potassium: 4 mmol/L (ref 3.5–5.1)
Potassium: 4.3 mmol/L (ref 3.5–5.1)
Potassium: 4.5 mmol/L (ref 3.5–5.1)
Potassium: 4.5 mmol/L (ref 3.5–5.1)
Sodium: 143 mmol/L (ref 135–145)
Sodium: 145 mmol/L (ref 135–145)
Sodium: 144 mmol/L (ref 135–145)
Sodium: 145 mmol/L (ref 135–145)
Sodium: 144 mmol/L (ref 135–145)
Sodium: 144 mmol/L (ref 135–145)
TCO2: 25 mmol/L (ref 22–32)
TCO2: 25 mmol/L (ref 22–32)
TCO2: 25 mmol/L (ref 22–32)
TCO2: 27 mmol/L (ref 22–32)
TCO2: 27 mmol/L (ref 22–32)
TCO2: 25 mmol/L (ref 22–32)
pCO2 arterial: 38.2 mmHg (ref 32.0–48.0)
pCO2 arterial: 35.7 mmHg (ref 32.0–48.0)
pCO2 arterial: 38.7 mmHg (ref 32.0–48.0)
pCO2 arterial: 48.7 mmHg — ABNORMAL HIGH (ref 32.0–48.0)
pCO2 arterial: 45.2 mmHg (ref 32.0–48.0)
pCO2 arterial: 33.9 mmHg (ref 32.0–48.0)
pH, Arterial: 7.398 (ref 7.350–7.450)
pH, Arterial: 7.428 (ref 7.350–7.450)
pH, Arterial: 7.401 (ref 7.350–7.450)
pH, Arterial: 7.326 — ABNORMAL LOW (ref 7.350–7.450)
pH, Arterial: 7.357 (ref 7.350–7.450)
pH, Arterial: 7.458 — ABNORMAL HIGH (ref 7.350–7.450)
pO2, Arterial: 67 mmHg — ABNORMAL LOW (ref 83.0–108.0)
pO2, Arterial: 57 mmHg — ABNORMAL LOW (ref 83.0–108.0)
pO2, Arterial: 82 mmHg — ABNORMAL LOW (ref 83.0–108.0)
pO2, Arterial: 61 mmHg — ABNORMAL LOW (ref 83.0–108.0)
pO2, Arterial: 85 mmHg (ref 83.0–108.0)
pO2, Arterial: 85 mmHg (ref 83.0–108.0)

## 2019-08-06 LAB — BASIC METABOLIC PANEL
Anion gap: 9 (ref 5–15)
Anion gap: 6 (ref 5–15)
BUN: 31 mg/dL — ABNORMAL HIGH (ref 6–20)
BUN: 28 mg/dL — ABNORMAL HIGH (ref 6–20)
CO2: 23 mmol/L (ref 22–32)
CO2: 25 mmol/L (ref 22–32)
Calcium: 8.2 mg/dL — ABNORMAL LOW (ref 8.9–10.3)
Calcium: 8.2 mg/dL — ABNORMAL LOW (ref 8.9–10.3)
Chloride: 111 mmol/L (ref 98–111)
Chloride: 112 mmol/L — ABNORMAL HIGH (ref 98–111)
Creatinine, Ser: 0.61 mg/dL (ref 0.61–1.24)
Creatinine, Ser: 0.69 mg/dL (ref 0.61–1.24)
GFR calc Af Amer: >60 mL/min (ref >60)
GFR calc Af Amer: >60 mL/min (ref >60)
GFR calc non Af Amer: >60 mL/min (ref >60)
GFR calc non Af Amer: >60 mL/min (ref >60)
Glucose, Bld: 97 mg/dL (ref 70–99)
Glucose, Bld: 117 mg/dL — ABNORMAL HIGH (ref 70–99)
Potassium: 4.2 mmol/L (ref 3.5–5.1)
Potassium: 4.7 mmol/L (ref 3.5–5.1)
Sodium: 143 mmol/L (ref 135–145)
Sodium: 143 mmol/L (ref 135–145)

## 2019-08-06 LAB — BPAM RBC
Blood Product Expiration Date: 202101242359
Blood Product Expiration Date: 202101242359
Blood Product Expiration Date: 202101242359
Blood Product Expiration Date: 202101242359
Blood Product Expiration Date: 202101242359
Blood Product Expiration Date: 202101252359
Blood Product Expiration Date: 202101252359
Blood Product Expiration Date: 202101252359
ISSUE DATE / TIME: 202012270029
ISSUE DATE / TIME: 202012291219
ISSUE DATE / TIME: 202012291219
ISSUE DATE / TIME: 202012291219
ISSUE DATE / TIME: 202012291642
Unit Type and Rh: 5100
Unit Type and Rh: 5100
Unit Type and Rh: 5100
Unit Type and Rh: 5100
Unit Type and Rh: 5100
Unit Type and Rh: 5100
Unit Type and Rh: 5100
Unit Type and Rh: 5100

## 2019-08-06 LAB — FIBRINOGEN: Fibrinogen: 152 mg/dL — ABNORMAL LOW (ref 210–475)

## 2019-08-06 LAB — CBC
HCT: 34.3 % — ABNORMAL LOW (ref 39.0–52.0)
HCT: 31.7 % — ABNORMAL LOW (ref 39.0–52.0)
Hemoglobin: 10.8 g/dL — ABNORMAL LOW (ref 13.0–17.0)
Hemoglobin: 9.9 g/dL — ABNORMAL LOW (ref 13.0–17.0)
MCH: 27.8 pg (ref 26.0–34.0)
MCH: 27.7 pg (ref 26.0–34.0)
MCHC: 31.5 g/dL (ref 30.0–36.0)
MCHC: 31.2 g/dL (ref 30.0–36.0)
MCV: 88.4 fL (ref 80.0–100.0)
MCV: 88.8 fL (ref 80.0–100.0)
Platelets: 71 10*3/uL — ABNORMAL LOW (ref 150–400)
Platelets: 65 10*3/uL — ABNORMAL LOW (ref 150–400)
RBC: 3.88 MIL/uL — ABNORMAL LOW (ref 4.22–5.81)
RBC: 3.57 MIL/uL — ABNORMAL LOW (ref 4.22–5.81)
RDW: 14.6 % (ref 11.5–15.5)
RDW: 14.8 % (ref 11.5–15.5)
WBC: 13.1 10*3/uL — ABNORMAL HIGH (ref 4.0–10.5)
WBC: 10.4 10*3/uL (ref 4.0–10.5)
nRBC: 0.2 % (ref 0.0–0.2)
nRBC: 0 % (ref 0.0–0.2)

## 2019-08-06 LAB — TYPE AND SCREEN
ABO/RH(D): O POS
Antibody Screen: NEGATIVE
Unit division: 0
Unit division: 0
Unit division: 0
Unit division: 0
Unit division: 0
Unit division: 0
Unit division: 0
Unit division: 0

## 2019-08-06 LAB — HEPATIC FUNCTION PANEL
ALT: 17 U/L (ref 0–44)
AST: 19 U/L (ref 15–41)
Albumin: 2.8 g/dL — ABNORMAL LOW (ref 3.5–5.0)
Alkaline Phosphatase: 38 U/L (ref 38–126)
Bilirubin, Direct: 0.2 mg/dL (ref 0.0–0.2)
Indirect Bilirubin: 0.6 mg/dL (ref 0.3–0.9)
Total Bilirubin: 0.8 mg/dL (ref 0.3–1.2)
Total Protein: 4.8 g/dL — ABNORMAL LOW (ref 6.5–8.1)

## 2019-08-06 LAB — HEPARIN LEVEL (UNFRACTIONATED)
Heparin Unfractionated: 0.61 IU/mL (ref 0.30–0.70)
Heparin Unfractionated: 0.72 IU/mL — ABNORMAL HIGH (ref 0.30–0.70)
Heparin Unfractionated: 0.47 IU/mL (ref 0.30–0.70)

## 2019-08-06 LAB — GLUCOSE, CAPILLARY
Glucose-Capillary: 102 mg/dL — ABNORMAL HIGH (ref 70–99)
Glucose-Capillary: 104 mg/dL — ABNORMAL HIGH (ref 70–99)
Glucose-Capillary: 104 mg/dL — ABNORMAL HIGH (ref 70–99)
Glucose-Capillary: 108 mg/dL — ABNORMAL HIGH (ref 70–99)
Glucose-Capillary: 115 mg/dL — ABNORMAL HIGH (ref 70–99)
Glucose-Capillary: 129 mg/dL — ABNORMAL HIGH (ref 70–99)
Glucose-Capillary: 162 mg/dL — ABNORMAL HIGH (ref 70–99)
Glucose-Capillary: 87 mg/dL (ref 70–99)

## 2019-08-06 LAB — LACTATE DEHYDROGENASE: LDH: 538 U/L — ABNORMAL HIGH (ref 98–192)

## 2019-08-06 LAB — APTT
aPTT: 71 seconds — ABNORMAL HIGH (ref 24–36)
aPTT: 73 seconds — ABNORMAL HIGH (ref 24–36)

## 2019-08-06 LAB — COOXEMETRY PANEL
Carboxyhemoglobin: 2.4 % — ABNORMAL HIGH (ref 0.5–1.5)
Methemoglobin: 1.3 % (ref 0.0–1.5)
O2 Saturation: 87.1 %
Total hemoglobin: 10.5 g/dL — ABNORMAL LOW (ref 12.0–16.0)

## 2019-08-06 LAB — LACTIC ACID, PLASMA: Lactic Acid, Venous: 1.6 mmol/L (ref 0.5–1.9)

## 2019-08-06 LAB — PROTIME-INR
INR: 1.3 — ABNORMAL HIGH (ref 0.8–1.2)
Prothrombin Time: 16.1 seconds — ABNORMAL HIGH (ref 11.4–15.2)

## 2019-08-06 MED ORDER — FENTANYL BOLUS VIA INFUSION
50.0000 ug | INTRAVENOUS | Status: DC | PRN
  Filled 2019-08-06: qty 100

## 2019-08-06 MED ORDER — MAGNESIUM HYDROXIDE 400 MG/5ML PO SUSP
15.0000 mL | Freq: Every day | ORAL | Status: DC
  Administered 2019-08-08 – 2019-08-14 (×6): 15 mL via ORAL
  Filled 2019-08-06 (×6): qty 30

## 2019-08-06 MED ORDER — FUROSEMIDE 10 MG/ML IJ SOLN
20.0000 mg | Freq: Once | INTRAMUSCULAR | Status: AC
  Administered 2019-08-06: 20 mg via INTRAVENOUS
  Filled 2019-08-06: qty 2

## 2019-08-06 MED ORDER — FREE WATER
200.0000 mL | Freq: Three times a day (TID) | Status: DC
  Administered 2019-08-07 – 2019-08-08 (×3): 200 mL

## 2019-08-06 MED ORDER — ALBUMIN HUMAN 5 % IV SOLN
INTRAVENOUS | Status: AC
  Administered 2019-08-06: 12.5 g
  Filled 2019-08-06: qty 250

## 2019-08-06 MED ORDER — HEPARIN (PORCINE) 25000 UT/250ML-% IV SOLN
1600.0000 [IU]/h | INTRAVENOUS | Status: DC
  Administered 2019-08-06: 1700 [IU]/h via INTRAVENOUS
  Administered 2019-08-07 – 2019-08-08 (×2): 1800 [IU]/h via INTRAVENOUS
  Filled 2019-08-06 (×5): qty 250

## 2019-08-06 MED ORDER — DEXAMETHASONE SODIUM PHOSPHATE 4 MG/ML IJ SOLN
3.0000 mg | INTRAMUSCULAR | Status: AC
  Administered 2019-08-06 – 2019-08-12 (×7): 3 mg via INTRAVENOUS
  Filled 2019-08-06 (×8): qty 1

## 2019-08-06 MED ORDER — ETOMIDATE 2 MG/ML IV SOLN
INTRAVENOUS | Status: AC
  Filled 2019-08-06: qty 20

## 2019-08-06 MED ORDER — PROPOFOL 500 MG/50ML IV EMUL
INTRAVENOUS | Status: AC
  Filled 2019-08-06: qty 50

## 2019-08-06 MED ORDER — SENNOSIDES-DOCUSATE SODIUM 8.6-50 MG PO TABS: 1.0000 | ORAL_TABLET | Freq: Two times a day (BID) | ORAL | Status: DC

## 2019-08-06 MED ORDER — STERILE WATER FOR INJECTION IJ SOLN
INTRAMUSCULAR | Status: AC
  Filled 2019-08-06: qty 10

## 2019-08-06 MED ORDER — SODIUM CHLORIDE 0.9 % IV SOLN
2.0000 g | Freq: Three times a day (TID) | INTRAVENOUS | Status: AC
  Administered 2019-08-06 – 2019-08-07 (×6): 2 g via INTRAVENOUS
  Filled 2019-08-06 (×6): qty 2

## 2019-08-06 MED ORDER — VECURONIUM BROMIDE 10 MG IV SOLR
INTRAVENOUS | Status: AC
  Filled 2019-08-06: qty 10

## 2019-08-06 MED ORDER — ALBUMIN HUMAN 5 % IV SOLN
INTRAVENOUS | Status: AC
  Administered 2019-08-06: 12.5 g
  Filled 2019-08-06: qty 500

## 2019-08-06 MED ORDER — DEXAMETHASONE SODIUM PHOSPHATE 4 MG/ML IJ SOLN
1.0000 mg | INTRAMUSCULAR | Status: AC
  Administered 2019-08-13 – 2019-08-19 (×7): 1 mg via INTRAVENOUS
  Filled 2019-08-06 (×7): qty 1

## 2019-08-06 NOTE — Progress Notes (Signed)
[facility] Progress Note Patient Name: [name] DOB: March 23, 1976 MRN: [id number]   Date of Service  08/06/2019  HPI/Events of Note  Vt 40 ml  eICU Interventions  PC setting increased to 25        Afreen Siebels U Ima Hafner 08/06/2019, 6:51 AM

## 2019-08-06 NOTE — Progress Notes (Addendum)
ANTICOAGULATION CONSULT NOTE -  Pharmacy Consult for heparin Indication: ECMO  No Known Allergies  Patient Measurements: Height: 5' 8"  (172.7 cm) Weight: 276 lb 0.3 oz (125.2 kg) IBW/kg (Calculated) : 68.4 Heparin Dosing Weight: 100.7 kg  Vital Signs: Temp: 98 F (36.7 C) (12/29 0400) Temp Source: Oral (12/29 0400) BP: 129/64 (12/29 0600) Pulse Rate: 63 (12/29 0600)  Labs: Recent Labs    08/04/19 0507 08/04/19 0512 08/05/19 0416 08/05/19 1014 08/05/19 1104 08/05/19 1104 08/05/19 1622 08/05/19 1822 08/05/19 1830 08/05/19 2136 08/06/19 0000 08/06/19 0002 08/06/19 0457  HGB 11.8*  --  11.4*  --   --    < > 11.2* 10.9*  --  9.9*  --   --  10.8*  10.5*  HCT 36.9*  --  35.3*  --   --   --  35.2* 32.0*  --  29.0*  --   --  34.3*  31.0*  PLT 82*   < > 75*  --   --   --  70*  --   --   --   --   --  71*  APTT  --    < > 53*   < > 57*  --   --   --  64*  --   --  71* 73*  LABPROT 18.6*  --  16.9*  --   --   --   --   --   --   --   --   --  16.1*  INR 1.6*  --  1.4*  --   --   --   --   --   --   --   --   --  1.3*  HEPARINUNFRC  --    < > 0.49  --  0.47  --   --   --  0.64  --  0.61  --   --   CREATININE 0.77   < > 0.68  --   --   --  0.69  --   --   --   --   --  0.61   < > = values in this interval not displayed.    Estimated Creatinine Clearance: 150.3 mL/min (by C-G formula based on SCr of 0.61 mg/dL).   Medical History: Past Medical History:  Diagnosis Date  . Asthma   . GERD (gastroesophageal reflux disease)   . Headache   . History of kidney stones    LEFT URETERAL STONE  . HTN (hypertension)   . Pancreatitis 2018   GALLBALDDER SLUDGE CAUSED ISSUED RESOLVED    Medications:  Scheduled:  . artificial tears  1 application Both Eyes E3P  . vitamin C  500 mg Per Tube Daily  . chlorhexidine gluconate (MEDLINE KIT)  15 mL Mouth Rinse BID  . chlorhexidine gluconate (MEDLINE KIT)  15 mL Mouth Rinse BID  . Chlorhexidine Gluconate Cloth  6 each Topical Daily   . dexamethasone (DECADRON) injection  6 mg Intravenous Q24H  . etomidate  40 mg Intravenous Once  . feeding supplement (PRO-STAT SUGAR FREE 64)  30 mL Per Tube BID  . fentaNYL (SUBLIMAZE) injection  200 mcg Intravenous Once  . furosemide  20 mg Intravenous Once  . insulin aspart  1 Units Subcutaneous Q4H  . insulin aspart  3-9 Units Subcutaneous Q4H  . mouth rinse  15 mL Mouth Rinse BID  . mouth rinse  15 mL Mouth Rinse 10 times per day  . midazolam  5 mg Intravenous Once  . pantoprazole (PROTONIX) IV  40 mg Intravenous QHS  . sodium chloride flush  10-40 mL Intracatheter Q12H  . vecuronium  10 mg Intravenous Once  . zinc sulfate  220 mg Per Tube Daily    Assessment: 88 yom known COVID+, has been declining with ARDS for past 48 hours requiring intubation and max vent settings. Underwent VV ECMO on 12/26 and is now on IV heparin.  Heparin level this morning is slightly high at 0.72, aPTT 73 seconds. CBC is stable though CT with more drainage overnight.      Goal of Therapy:  Heparin level 0.5-0.7 units/ml >>targeting at higher end of goal range aPTT 66-102 seconds  Monitor platelets by anticoagulation protocol: Yes   Plan:  -Reduce heparin to 1700 units/hr -Stop heparin at 1200 for tracheostomy, will restart immediately after per CCM (same rate, no bolus)   ADDENDUM: Pt now s/p tracheostomy, per CCM ok to restart IV heparin now.  Plan: Restart heparin 1700 units/hr Recheck heparin level in 6hr - if therapeutic can resume q12h checks (0500/1700)     Arrie Senate, PharmD, BCPS Clinical Pharmacist [phone] Please check AMION for all [place] numbers 08/06/2019

## 2019-08-06 NOTE — Progress Notes (Signed)
Patient ID: [name], male   DOB: 06/03/1976, 43 y.o.   MRN: [id number]    Advanced Heart Failure Rounding Note   Subjective:    Stable on VV ecmo with good flows and oxygen saturation 97%. Off vasopressin. MAP 80s. Lactate normal. On Nimbex, fentanyl, Versed, Ketamine for sedation.   CXR: Bilateral lung opacities c/w ARDS  I/Os even, CVP 9.  Stable creatinine.   Platelets 76=>71, LDH 562=>538.    Afebrile, remains on vancomycin/cefepime with WBCs 13.1.  PCT < 0.1.   On vent 40% PEEP 10. Bilateral CTs in for PTXs  ECMO parameters Flow 4.8 RPM 3400 dP 17 Pressure -99   Objective:   Weight Range:  Vital Signs:   Temp:  [98 F (36.7 C)-98.4 F (36.9 C)] 98 F (36.7 C) (12/29 0400) Pulse Rate:  [61-100] 63 (12/29 0600) Resp:  [0-24] 0 (12/29 0600) BP: (116-195)/(42-67) 129/64 (12/29 0600) SpO2:  [89 %-99 %] 98 % (12/29 0600) Arterial Line BP: (134-203)/(42-66) 152/64 (12/29 0600) FiO2 (%):  [40 %] 40 % (12/29 0400) Weight:  [125.2 kg] 125.2 kg (12/29 0500) Last BM Date: (PTA)  Weight change: Filed Weights   08/04/19 0600 08/05/19 0500 08/06/19 0500  Weight: 127.6 kg 128.2 kg 125.2 kg    Intake/Output:   Intake/Output Summary (Last 24 hours) at 08/06/2019 0729 Last data filed at 08/06/2019 0600 Gross per 24 hour  Intake 4702.71 ml  Output 4555 ml  Net 147.71 ml     Physical Exam: General: Sedated on vent.  Neck: Thick, JVP difficult, no thyromegaly or thyroid nodule.  Lungs: Clear to auscultation bilaterally with normal respiratory effort. CV: Nondisplaced PMI.  Heart regular S1/S2, no S3/S4, no murmur.  1+ ankle edema.   Abdomen: Soft, no hepatosplenomegaly, no distention.  Skin: Intact without lesions or rashes.  Neurologic: Sedated/paralyzed Extremities: No clubbing or cyanosis.  HEENT: Normal.   Telemetry: Sinus 70-80s Personally reviewed  Labs: Basic Metabolic Panel: Recent Labs  Lab 08/02/19 0655 08/03/19 1040 08/03/19 1542  08/03/19 2131 08/04/19 0507 08/04/19 1644 08/05/19 0416 08/05/19 1014 08/05/19 1622 08/05/19 1822 08/05/19 2136 08/06/19 0457  NA 137  --   --   --  138 136 139 143 140 142 143 143  145  K 4.0  --   --   --  4.4 4.7 3.9 3.3* 4.5 4.5 4.4 4.2  4.2  CL 100  --    < >  --  108 108 111  --  110  --   --  111  CO2 24  --    < >  --  23 24 22  --  27  --   --  23  GLUCOSE 84  --    < >  --  132* 167* 136*  --  141*  --   --  97  BUN 35*  --    < >  --  35* 39* 41*  --  36*  --   --  31*  CREATININE 0.91  --    < >  --  0.77 0.74 0.68  --  0.69  --   --  0.61  CALCIUM 8.6*  --    < >  --  7.6* 7.5* 7.8*  --  8.0*  --   --  8.2*  MG 2.1 2.4  --   --   --  2.8*  --   --   --   --   --   --     PHOS 4.3 7.1*  --  4.2  --  2.7  --   --   --   --   --   --    < > = values in this interval not displayed.    Liver Function Tests: Recent Labs  Lab 08/03/19 0200 08/03/19 2108 08/04/19 0507 08/05/19 0416 08/06/19 0457  AST 42* 27 26 22 19  ALT 29 21 20 19 17  ALKPHOS 104 77 52 46 38  BILITOT 1.2 0.8 1.3* 1.0 0.8  PROT 6.7 4.6* 4.6* 4.5* 4.8*  ALBUMIN 3.4* 2.4* 2.7* 2.7* 2.8*   No results for input(s): LIPASE, AMYLASE in the last 168 hours. No results for input(s): AMMONIA in the last 168 hours.  CBC: Recent Labs  Lab 08/02/19 0655 08/03/19 0946 08/04/19 0507 08/04/19 1644 08/05/19 0416 08/05/19 1014 08/05/19 1622 08/05/19 1822 08/05/19 2136 08/06/19 0457  WBC 15.2*   < > 14.2* 13.3* 12.5*  --  13.4*  --   --  13.1*  NEUTROABS 11.7*  --   --   --   --   --   --   --   --   --   HGB 16.4  --  11.8* 12.0* 11.4* 10.9* 11.2* 10.9* 9.9* 10.8*  10.5*  HCT 50.4  --  36.9* 38.0* 35.3* 32.0* 35.2* 32.0* 29.0* 34.3*  31.0*  MCV 84.3   < > 86.4 88.6 86.3  --  87.8  --   --  88.4  PLT 185   < > 82* 76* 75*  --  70*  --   --  71*   < > = values in this interval not displayed.    Cardiac Enzymes: No results for input(s): CKTOTAL, CKMB, CKMBINDEX, TROPONINI in the last 168  hours.  BNP: BNP (last 3 results) Recent Labs    07/22/19 1039  BNP 15.3    ProBNP (last 3 results) No results for input(s): PROBNP in the last 8760 hours.    Other results:  Imaging: DG CHEST PORT 1 VIEW  Result Date: 08/06/2019 CLINICAL DATA:  Endotracheal tube. Chest tube. Cardiorespiratory failure. On ECMO. EXAM: PORTABLE CHEST 1 VIEW COMPARISON:  Radiograph yesterday. CT 07/30/2019 FINDINGS: Endotracheal tube, enteric tube, left central line and bilateral chest tubes remain in place. ECMO catheter faintly visualized projecting over the atrial IVC junction. Additional ECMO catheter in the upper IVC. Multiple additional overlying monitoring devices in place. Progressive diffuse airspace opacities since yesterday. Cardiomediastinal contours are obscured. No visualized pneumothorax. IMPRESSION: 1. Progressive bilateral lung opacities since yesterday which may represent pneumonia, ARDS, or combination thereof. 2. Support apparatus unchanged. Electronically Signed   By: Melanie  Sanford M.D.   On: 08/06/2019 06:40   DG CHEST PORT 1 VIEW  Result Date: 08/05/2019 CLINICAL DATA:  Endotracheal tube exchange EXAM: PORTABLE CHEST 1 VIEW COMPARISON:  08/05/2019 at 0558 hours FINDINGS: Endotracheal tube now projects within the right mainstem bronchus. Enteric tube courses below the diaphragm with distal tip and side hole beyond the inferior margin of the film. Left IJ central venous catheter is unchanged in positioning. Bilateral chest tubes remain in place. Stable cardiomediastinal contours. Diffuse patchy bilateral airspace opacities. No pneumothorax is evident. IMPRESSION: 1. Endotracheal tube tip now projects within the right mainstem bronchus. Recommend retraction 4-5 cm. 2. Stable appearance of the chest with diffuse patchy bilateral airspace opacities. 3. No pneumothorax. These results will be called to the ordering clinician or representative by the Radiologist Assistant, and communication  documented in the PACS   or zVision Dashboard. Electronically Signed   By: Nicholas  Plundo D.O.   On: 08/05/2019 08:08   DG CHEST PORT 1 VIEW  Result Date: 08/05/2019 CLINICAL DATA:  Endotracheal tube. EXAM: PORTABLE CHEST 1 VIEW COMPARISON:  August 04, 2019. FINDINGS: Stable cardiomediastinal silhouette. Endotracheal and nasogastric tubes are unchanged in position. Bilateral chest tubes are noted without pneumothorax. Stable bilateral lung opacities are noted concerning for pneumonia. Bony thorax is unremarkable. IMPRESSION: Stable support apparatus. Stable bilateral lung opacities are noted concerning for pneumonia. No pneumothorax is noted. Bilateral chest tubes are unchanged without pneumothorax. Electronically Signed   By: James  Green Jr M.D.   On: 08/05/2019 07:18     Medications:     Scheduled Medications: . artificial tears  1 application Both Eyes Q8H  . vitamin C  500 mg Per Tube Daily  . chlorhexidine gluconate (MEDLINE KIT)  15 mL Mouth Rinse BID  . chlorhexidine gluconate (MEDLINE KIT)  15 mL Mouth Rinse BID  . Chlorhexidine Gluconate Cloth  6 each Topical Daily  . dexamethasone (DECADRON) injection  6 mg Intravenous Q24H  . etomidate  40 mg Intravenous Once  . feeding supplement (PRO-STAT SUGAR FREE 64)  30 mL Per Tube BID  . fentaNYL (SUBLIMAZE) injection  200 mcg Intravenous Once  . furosemide  20 mg Intravenous Once  . insulin aspart  1 Units Subcutaneous Q4H  . insulin aspart  3-9 Units Subcutaneous Q4H  . mouth rinse  15 mL Mouth Rinse BID  . mouth rinse  15 mL Mouth Rinse 10 times per day  . midazolam  5 mg Intravenous Once  . pantoprazole (PROTONIX) IV  40 mg Intravenous QHS  . sodium chloride flush  10-40 mL Intracatheter Q12H  . vecuronium  10 mg Intravenous Once  . zinc sulfate  220 mg Per Tube Daily    Infusions: . sodium chloride    . sodium chloride 10 mL/hr at 08/05/19 1800  . ceFEPime (MAXIPIME) IV    . cisatracurium (NIMBEX) infusion 6 mcg/kg/min  (08/06/19 0629)  . dextrose    . feeding supplement (PIVOT 1.5 CAL)    . fentaNYL infusion INTRAVENOUS 400 mcg/hr (08/06/19 0531)  . heparin 1,750 Units/hr (08/05/19 2202)  . ketamine (KETALAR) Adult IV Infusion 1 mg/kg/hr (08/06/19 0531)  . midazolam 10 mg/hr (08/06/19 0628)    PRN Medications: Place/Maintain arterial line **AND** sodium chloride, sodium chloride, acetaminophen, dextrose, fentaNYL, fentaNYL, metoprolol tartrate, midazolam, ondansetron (ZOFRAN) IV, sodium chloride, sodium chloride flush   Assessment/Plan:    1. Acute hypoxic/hypercarbic respiratory failure due to COVID PNA: Remained markedly hypoxic despite full support. Intubated 12/26. S/p bilateral CTs 12/26 for pneumothoraces.  TEE on 12/26 LVEF 70% RV ok. VV ECMO begun 12/26. Day #4 VV ECMO. COVID ARDS at this point.  Flows stable. Oxygen saturation stable, good color change between cannulas, suspect level of mixing acceptable.  Low level hemolysis, plts and LDH fairly stable. Good I/Os with Lasix IV yesterday.  - Will give Lasix 20 mg IV x 1, aim to keep even to slightly negative.  - Heparin gtt per pharmacy.  - Tracheostomy today.  2. Bilateral PTX: Due to barotrauma. CTs in place 3. COVID PNA: management of COVID infection per CCM. CXR with bilateral multifocal PNA progressing to likely ARDS.  - s/p 10 days of remdesivir - 12/16, 12/2 s/p tociluzimab - 12/17 s/p convalescent plasma - On decadron - On vanc & cefepime while on ECMO (started 12/26) => with very low PCT, if trach aspirate   culture is negative could probably stop abx.  4. ID: Empiric coverage with vancomycin/cefepime for possible secondary bacterial PNA.  Low PCT, can likely stop if trach aspirate culture negative.   CRITICAL CARE Performed by: Loralie Champagne  Total critical care time: 35 minutes  Critical care time was exclusive of separately billable procedures and treating other patients.  Critical care was necessary to treat or prevent  imminent or life-threatening deterioration.  Critical care was time spent personally by me (independent of midlevel providers or residents) on the following activities: development of treatment plan with patient and/or surrogate as well as nursing, discussions with consultants, evaluation of patient's response to treatment, examination of patient, obtaining history from patient or surrogate, ordering and performing treatments and interventions, ordering and review of laboratory studies, ordering and review of radiographic studies, pulse oximetry and re-evaluation of patient's condition.    Length of Stay: 15   Loralie Champagne MD 08/06/2019, 7:29 AM  Advanced Heart Failure Team Pager [phone] (M-F; 7a - 4p)  Please contact [facility] for night-coverage after hours (4p -7a ) and weekends on [email or web address]

## 2019-08-06 NOTE — Progress Notes (Signed)
Patient's PC increased to 25 for a total pressure control of 35 due to low tidal volumes per Dr. Doreene Burke orders.

## 2019-08-06 NOTE — Progress Notes (Signed)
ANTICOAGULATION CONSULT NOTE -  Pharmacy Consult for heparin Indication: ECMO  No Known Allergies  Patient Measurements: Height: _0  (172.7 cm) Weight: 276 lb 0.3 oz (125.2 kg) IBW/kg (Calculated) : 68.4 Heparin Dosing Weight: 100.7 kg  Vital Signs: Temp: 98.3 F (36.8 C) (12/29 1630) Temp Source: Oral (12/29 1630) BP: 126/62 (12/29 2000) Pulse Rate: 60 (12/29 2000)  Labs: Recent Labs    08/04/19 0507 08/04/19 0512 08/05/19 0416 08/05/19 0426 08/05/19 1622 08/05/19 1830 08/06/19 0000 08/06/19 0002 08/06/19 0457 08/06/19 1507 08/06/19 1644 08/06/19 1954 08/06/19 2015  HGB 11.8*  --  11.4*  --  11.2*  --   --   --  10.8*  10.5* 9.9* 9.9* 9.5*  --   HCT 36.9*  --  35.3*  --  35.2*  --   --   --  34.3*  31.0* 29.0* 31.7* 28.0*  --   PLT 82*   < > 75*  --  70*  --   --   --  71*  --  65*  --   --   APTT  --    < > 53*   < >  --  64*  --  71* 73*  --   --   --   --   LABPROT 18.6*  --  16.9*  --   --   --   --   --  16.1*  --   --   --   --   INR 1.6*  --  1.4*  --   --   --   --   --  1.3*  --   --   --   --   HEPARINUNFRC  --    < > 0.49   < >  --  0.64 0.61  --  0.72*  --   --   --  0.47  CREATININE 0.77   < > 0.68  --  0.69  --   --   --  0.61  --  0.69  --   --    < > = values in this interval not displayed.    Estimated Creatinine Clearance: 150.3 mL/min (by C-G formula based on SCr of 0.69 mg/dL).   Medical History: Past Medical History:  Diagnosis Date  . Asthma   . GERD (gastroesophageal reflux disease)   . Headache   . History of kidney stones    LEFT URETERAL STONE  . HTN (hypertension)   . Pancreatitis 2018   GALLBALDDER SLUDGE CAUSED ISSUED RESOLVED    Medications:  Scheduled:  . artificial tears  1 application Both Eyes Y3K  . vitamin C  500 mg Per Tube Daily  . chlorhexidine gluconate (MEDLINE KIT)  15 mL Mouth Rinse BID  . chlorhexidine gluconate (MEDLINE KIT)  15 mL Mouth Rinse BID  . Chlorhexidine Gluconate Cloth  6 each Topical  Daily  . dexamethasone (DECADRON) injection  3 mg Intravenous Q24H   Followed by  . [START ON 08/13/2019] dexamethasone (DECADRON) injection  1 mg Intravenous Q24H  . etomidate      . feeding supplement (PRO-STAT SUGAR FREE 64)  30 mL Per Tube BID  . free water  200 mL Per Tube Q8H  . insulin aspart  1 Units Subcutaneous Q4H  . insulin aspart  3-9 Units Subcutaneous Q4H  . magnesium hydroxide  15 mL Oral Daily  . mouth rinse  15 mL Mouth Rinse BID  . mouth rinse  15 mL  Mouth Rinse 10 times per day  . pantoprazole (PROTONIX) IV  40 mg Intravenous QHS  . senna-docusate  1 tablet Oral BID  . sodium chloride flush  10-40 mL Intracatheter Q12H  . sterile water (preservative free)      . vecuronium      . zinc sulfate  220 mg Per Tube Daily    Assessment: 55 yom known COVID+, has been declining with ARDS for past 48 hours requiring intubation and max vent settings. Underwent VV ECMO on 12/26 and is now on IV heparin.  Heparin level this evening is 0.47. CBC is stable. No issues per Rn.     Goal of Therapy:  Heparin level 0.5-0.7 units/ml >>targeting at higher end of goal range aPTT 66-102 seconds  Monitor platelets by anticoagulation protocol: Yes   Plan:  -Increase heparin to 1750 units/hr -Continue 5a/5p heparin level checks  Erin Hearing PharmD., BCPS Clinical Pharmacist 08/06/2019 9:10 PM

## 2019-08-06 NOTE — Progress Notes (Signed)
NAME:  [name], MRN:  [id number], DOB:  1976-02-17, LOS: 61 ADMISSION DATE:  07/22/2019, CONSULTATION DATE:  12/24 REFERRING MD:  Sloan Leiter, CHIEF COMPLAINT:  Dyspnea   Brief History   [name] admitted 12/14 with COVID pneumonia causing acute hypoxemic respiratory failure.   Developed pneumomediastinum 12/23 with concern for possible bilateral small pneumothoraces on 12/24.  PCCM consulted for evaluation of pneumothoraces  Past Medical History  GERD HTN Asthma  Significant Hospital Events   12/14 admit with hypoxemic respiratory failure in setting of COVID-19 pneumonia 12/23 noted to have pneumomediastinum on chest x-ray 12/24 PCCM consulted for evaluation 12/26 worsening hypoxemia, intubated  12/27 VV fem/fem ECMO cannulation 12/28 ETT switch  Consults:  PCCM  Procedures:    Significant Diagnostic Tests:  CT chest 12/22 >> extensive pneumomediastinum, multifocal patchy bilateral groundglass opacitiesCT chest 12/22 >> extensive pneumomediastinum, multifocal patchy bilateral groundglass opacities  Micro Data:  BCx2 12/14 >> negative  Sputum 12/15 >> negative   Antimicrobials:  Received remdesivir, steroids, Actemra and convalescent plasma   Interim history/subjective:  The recruited after turning, pressure control increased.  Objective   Blood pressure 129/64, pulse 63, temperature 98 F (36.7 C), temperature source Oral, resp. rate 20, height 5' 8"  (1.727 m), weight 125.2 kg, SpO2 97 %. CVP:  [6 mmHg-17 mmHg] 9 mmHg  Vent Mode: PCV FiO2 (%):  [40 %] 40 % Set Rate:  [20 bmp] 20 bmp PEEP:  [10 cmH20] 10 cmH20 Plateau Pressure:  [19 cmH20-24 cmH20] 19 cmH20   Intake/Output Summary (Last 24 hours) at 08/06/2019 0962 Last data filed at 08/06/2019 0600 Gross per 24 hour  Intake 4702.71 ml  Output 4555 ml  Net 147.71 ml   Filed Weights   08/04/19 0600 08/05/19 0500 08/06/19 0500  Weight: 127.6 kg 128.2 kg 125.2 kg    Examination:  GEN: ill appearing man  on vent HEENT: HEENT CV: RRR, ext warm PULM: Severely diminished bilaterally GI: Soft, hypoactive BS EXT: Global anasarca, venous sheaths look okay, no clots on my circuit check, Pven -90s stable, flows 5LPM NEURO: Sedated/paralyzed PSYCH: BIS 40s-50s SKIN: Mild paleness  BMP stable, mild hyponatremia Stable mild leukocytosis Pct neg LDH stable elevation Plts stable low CBGs look good  Resolved Hospital Problem list     Assessment & Plan:  # Acute Hypoxemic Respiratory Failure secondary to COVID- finished remdesivir, got actemra, activated plasma, on steroids.  Inability to ventilate/oxygenate leading to VV ECMO cannulation 08/04/19. # Bilateral pneumothoraces and pneumomediastinum # Morbid obesity # Asthma # Stress hyperglycemia # Thrombocytopenia and low grade hemolysis stable  - Continue chest tubes to suction - Fine to tolerate severely reduced TV on vent, even MV of 0.  The only good evidence in this regard is that reducing the mechanical power applied to ARDS lungs is associated with improved mortality.  Whether to accomplish this with complete lung rest or attempting 4 mL/kg/min is still up for debate but most agree that limiting driving pressures to <15 cmH2O, Pplat <30cmH2O has the best outcomes - Continue sedation as ordered: fentanyl/versed titrated to BIS < 50, fixed dose ketaimin; ketamine increases BIS in bolus dose, its effect as a drip is more variable.  Will work on [employer] so this won't be as much of an issue. - DC vanc, cefepime x 1 more day to complete 5 days, Pct neg  - Monitor Plts - Continue SSI - Going to drop decadron to 81m/day x 1 week then 1 mg/day x 1 week  then stop.  High risk for HAI and benefit of prolonged steroids in this population is unproven. - Diuretics to keep even - Tracheostomy today to faciliate NMB and sedation wean - Would be ideal to prone but not sure we can figure out positioning cannula and body habitus is  prohibitive - As lungs recover, PC should start to increase MV, may need therapeutic bronchoscopy to help facilitate this in future - Guarded prognosis, COVID survival on ECMO is still pretty low  Best practice:   Feeding: TF Analgesia: see above Sedation: see above Thromboprophylaxis: heparin gtt Ulcer prophylaxis: pantoprazole Glucose control: SSI Family: will reach out after trach   Labs   CBC: Recent Labs  Lab 08/02/19 0655 08/03/19 0946 08/04/19 0507 08/04/19 1644 08/05/19 0416 08/05/19 1014 08/05/19 1622 08/05/19 1822 08/05/19 2136 08/06/19 0457  WBC 15.2*   < > 14.2* 13.3* 12.5*  --  13.4*  --   --  13.1*  NEUTROABS 11.7*  --   --   --   --   --   --   --   --   --   HGB 16.4  --  11.8* 12.0* 11.4* 10.9* 11.2* 10.9* 9.9* 10.8*  10.5*  HCT 50.4  --  36.9* 38.0* 35.3* 32.0* 35.2* 32.0* 29.0* 34.3*  31.0*  MCV 84.3   < > 86.4 88.6 86.3  --  87.8  --   --  88.4  PLT 185   < > 82* 76* 75*  --  70*  --   --  71*   < > = values in this interval not displayed.    Basic Metabolic Panel: Recent Labs  Lab 08/02/19 0655 08/03/19 1040 08/03/19 1542 08/03/19 2131 08/04/19 0507 08/04/19 1644 08/05/19 0416 08/05/19 1014 08/05/19 1622 08/05/19 1822 08/05/19 2136 08/06/19 0457  NA 137  --   --   --  138 136 139 143 140 142 143 143  145  K 4.0  --   --   --  4.4 4.7 3.9 3.3* 4.5 4.5 4.4 4.2  4.2  CL 100  --    < >  --  108 108 111  --  110  --   --  111  CO2 24  --    < >  --  23 24 22   --  27  --   --  23  GLUCOSE 84  --    < >  --  132* 167* 136*  --  141*  --   --  97  BUN 35*  --    < >  --  35* 39* 41*  --  36*  --   --  31*  CREATININE 0.91  --    < >  --  0.77 0.74 0.68  --  0.69  --   --  0.61  CALCIUM 8.6*  --    < >  --  7.6* 7.5* 7.8*  --  8.0*  --   --  8.2*  MG 2.1 2.4  --   --   --  2.8*  --   --   --   --   --   --   PHOS 4.3 7.1*  --  4.2  --  2.7  --   --   --   --   --   --    < > = values in this interval not displayed.   GFR: Estimated  Creatinine Clearance: 150.3 mL/min (by  C-G formula based on SCr of 0.61 mg/dL). Recent Labs  Lab 07/31/19 0300 08/01/19 0300 08/04/19 0200 08/04/19 0507 08/04/19 1644 08/05/19 0416 08/05/19 1104 08/05/19 1622 08/06/19 0457 08/06/19 0532  PROCALCITON <0.10  --   --   --   --   --  <0.10  --   --   --   WBC  --   --  16.8* 14.2* 13.3* 12.5*  --  13.4* 13.1*  --   LATICACIDVEN  --    < > 1.5 1.4  --  1.5  --   --   --  1.6   < > = values in this interval not displayed.    Liver Function Tests: Recent Labs  Lab 08/03/19 0200 08/03/19 2108 08/04/19 0507 08/05/19 0416 08/06/19 0457  AST 42* 27 26 22 19   ALT 29 21 20 19 17   ALKPHOS 104 77 52 46 38  BILITOT 1.2 0.8 1.3* 1.0 0.8  PROT 6.7 4.6* 4.6* 4.5* 4.8*  ALBUMIN 3.4* 2.4* 2.7* 2.7* 2.8*   No results for input(s): LIPASE, AMYLASE in the last 168 hours. No results for input(s): AMMONIA in the last 168 hours.  ABG    Component Value Date/Time   PHART 7.428 08/06/2019 0457   PCO2ART 35.7 08/06/2019 0457   PO2ART 57.0 (L) 08/06/2019 0457   HCO3 23.6 08/06/2019 0457   TCO2 25 08/06/2019 0457   ACIDBASEDEF 1.0 08/06/2019 0457   O2SAT 87.1 08/06/2019 0457   O2SAT 91.0 08/06/2019 0457     Coagulation Profile: Recent Labs  Lab 08/03/19 2108 08/04/19 0507 08/05/19 0416 08/06/19 0457  INR 2.0* 1.6* 1.4* 1.3*    Cardiac Enzymes: No results for input(s): CKTOTAL, CKMB, CKMBINDEX, TROPONINI in the last 168 hours.  HbA1C: Hgb A1c MFr Bld  Date/Time Value Ref Range Status  07/24/2019 04:50 AM 6.7 (H) 4.8 - 5.6 % Final    Comment:    (NOTE) Pre diabetes:          5.7%-6.4% Diabetes:              >6.4% Glycemic control for   <7.0% adults with diabetes   09/30/2017 05:36 AM 5.7 (H) 4.8 - 5.6 % Final    Comment:    (NOTE)         Prediabetes: 5.7 - 6.4         Diabetes: >6.4         Glycemic control for adults with diabetes: <7.0     CBG: Recent Labs  Lab 08/05/19 1145 08/05/19 1614 08/05/19 1941  08/06/19 0006 08/06/19 0454  GLUCAP 121* 141* 162* 129* 104*     Critical care time: 40 minutes not including separately billable procedures     Erskine Emery MD PCCM

## 2019-08-06 NOTE — Procedures (Signed)
Procedure: Percutaneous Tracheostomy CPT 31600 Performed by: Dr. Erskine Emery Bronchoscopy Assistant: Dr. Wonda Amis.  Indications: Chronic respiratory failure and need for ongoing mechanical ventilation.  Consent: Given patient's intubation and sedation, the patient was unable to provide consent. Discussed the procedure with the patient's decision maker, including the indications, risks, benefits, and alternatives. All questions were answered. Verbal witnessed consent was obtained and placed in the chart.  Preprocedure: Universal protocol was followed for this procedure. Prior to the initiation of sedation or the procedure, a timeout/"Pause for the Cause" was performed. The patient's identity was verified by confirming the patient's wrist band for name, date of birth, and medical record number. Everyone in the room was in agreement with the patient identify, the procedure to be performed, consent was in place and matched the planned procedure, and the procedure site. The area was cleaned with a CHG scrub and draped with large sterile barrier. Hand hygiene was performed, and cap, mask, sterile gown, and sterile gloves were worn. The patient was covered by a large sterile drape. Sterile technique was maintained for the entire procedure.  Anesthesia: The patient was intubated and sedated prior to the procedure. Additional midazolam, etomidate, fentanyl and rocuronium were given for sedation and paralysis with close attention to vital signs throughout procedure. Please refer to the accompanying procedural sedation form for additional details. Once the patient was adequately sedated and with continuous BIS monitoring, vecuronium was administered for paralysis.  Procedure: The patient was placed in the supine position. The anterior neck was prepped and draped in usual sterile fashion. 1% lidocaine was administered approximately 2 fingerbreadths above the sternal notch for local anesthesia. A 1.5-cm vertical incision  was then performed 2 fingerbreadths above the sternal notch. Using a curved Kelly, blunt dissection was performed down to the level of the pretracheal fascia. At this point, the bronchoscope was introduced through the endotracheal tube and the trachea was properly visualized. The endotracheal tube was then gradually withdrawn within the trachea under direct bronchoscopic visualization. Proper midline position was confirmed by bouncing the needle from the tracheostomy tray over the trachea with bronchoscopic examination. The needle was advanced into the trachea and proper positioning was confirmed with direct visualization. The needle was then removed leaving a white outer cannula in position. The wire from the tracheostomy tray was then advanced through the white outer cannula. The cannula was then removed. The small, blue dilator was then advanced over the wire into the trachea. Once proper dilatation was achieved, the dilator was removed. The large, tapered dilator was then advanced over the wire into the trachea. The dilator was removed leaving the wire and white inner cannula in position. A number 6-0 percutaneous Shiley tracheostomy tube was then advanced over the wire and white inner cannula into the trachea. Proper positioning was confirmed with bronchoscopic visualization. The tracheostomy tube was then sutured in place with two nylon sutures. It was further secured with a tracheostomy tie.  Estimated blood loss: Less than 5 mL.  Complications: None immediate.  CXR ordered.

## 2019-08-06 NOTE — Progress Notes (Signed)
ANTICOAGULATION CONSULT NOTE - Follow Up Consult  Pharmacy Consult for heparin Indication: ECMO  Labs: Recent Labs    08/03/19 2108 08/03/19 2222 08/04/19 0507 08/04/19 0512 08/04/19 1644 08/04/19 1651 08/05/19 0416 08/05/19 1104 08/05/19 1622 08/05/19 1822 08/05/19 1830 08/05/19 2136 08/06/19 0000 08/06/19 0002  HGB 9.8*  --  11.8*  --  12.0*  --  11.4*  --  11.2* 10.9*  --  9.9*  --   --   HCT 31.1*  --  36.9*  --  38.0*  --  35.3*  --  35.2* 32.0*  --  29.0*  --   --   PLT 74*   < > 82*  --  76*  --  75*  --  70*  --   --   --   --   --   APTT >200*   < >  --    < >  --    < > 53* 57*  --   --  64*  --   --  71*  LABPROT 22.3*  --  18.6*  --   --   --  16.9*  --   --   --   --   --   --   --   INR 2.0*  --  1.6*  --   --   --  1.4*  --   --   --   --   --   --   --   HEPARINUNFRC 1.32*   < >  --    < >  --    < > 0.49 0.47  --   --  0.64  --  0.61  --   CREATININE 1.12  --  0.77  --  0.74  --  0.68  --  0.69  --   --   --   --   --    < > = values in this interval not displayed.    Assessment/Plan:  43yo male remains therapeutic on heparin. Will continue gtt at current rate, check with am labs, and change frequency of lab checks to Q12H.Marland Kitchen   Wynona Neat, PharmD, BCPS  08/06/2019,1:17 AM

## 2019-08-06 NOTE — Procedures (Signed)
Bedside Bronch for trach placement  Procedure done by Dr. Vaughan Browner. At first bronch was introduce through ET tube and structures of tracheal rings, carina identified for operator of tracheostomy who was Dr Erskine Emery. Under bronchoscopy guidance,  ET tube was pulled back sufficiently and very carefully. The ET tube was  pulled back enough to give room for tracheostomy operator and yet at same time to to ensure a secured airway. After this was accomplished, bronchoscope was withdrawn into the ET tube. After this,  Dr Tamala Julian then performed tracheostomy under video visual provided by flexible video bronchoscopy. Followng introduction of tracheostomy,  the bronchoscope was removed from ET tube and introduced through tracheostomy. Correct position of tracheostomy was ensured, with enough room between carina and distal tracheostomy and no evidence of bleeding. The bronchoscope was then withdrawn. Respiratory therapist was then instructed to remove the ET tube.  Dr Tamala Julian then proceeded to complete the tracheostomy with stay sutures.  No complications   [name] MD [facility] Please see [email or web address] for pager details.  08/06/2019, 2:16 PM

## 2019-08-06 NOTE — Progress Notes (Signed)
[facility] Progress Note Patient Name: [name] DOB: 11-05-1975 MRN: [id number]   Date of Service  08/06/2019  HPI/Events of Note  Sub-optimal sedation by BIS monitoring.  eICU Interventions  Fentanyl infusion increased to 400 mcg, bolus Fentanyl increased to 50-100 mcg PRN.        Kerry Kass [name] 08/06/2019, 12:08 AM

## 2019-08-07 ENCOUNTER — Inpatient Hospital Stay (HOSPITAL_COMMUNITY): Payer: Managed Care, Other (non HMO)

## 2019-08-07 LAB — CBC WITH DIFFERENTIAL/PLATELET
Abs Immature Granulocytes: 0.52 10*3/uL — ABNORMAL HIGH (ref 0.00–0.07)
Basophils Absolute: 0 10*3/uL (ref 0.0–0.1)
Basophils Relative: 0 %
Eosinophils Absolute: 0.1 10*3/uL (ref 0.0–0.5)
Eosinophils Relative: 1 %
HCT: 25.8 % — ABNORMAL LOW (ref 39.0–52.0)
Hemoglobin: 8.1 g/dL — ABNORMAL LOW (ref 13.0–17.0)
Immature Granulocytes: 7 %
Lymphocytes Relative: 20 %
Lymphs Abs: 1.4 10*3/uL (ref 0.7–4.0)
MCH: 28.4 pg (ref 26.0–34.0)
MCHC: 31.4 g/dL (ref 30.0–36.0)
MCV: 90.5 fL (ref 80.0–100.0)
Monocytes Absolute: 0.4 10*3/uL (ref 0.1–1.0)
Monocytes Relative: 6 %
Neutro Abs: 4.7 10*3/uL (ref 1.7–7.7)
Neutrophils Relative %: 66 %
Platelets: 55 10*3/uL — ABNORMAL LOW (ref 150–400)
RBC: 2.85 MIL/uL — ABNORMAL LOW (ref 4.22–5.81)
RDW: 15.5 % (ref 11.5–15.5)
WBC: 7.2 10*3/uL (ref 4.0–10.5)
nRBC: 0.6 % — ABNORMAL HIGH (ref 0.0–0.2)

## 2019-08-07 LAB — POCT I-STAT 7, (LYTES, BLD GAS, ICA,H+H)
Acid-Base Excess: 2 mmol/L (ref 0.0–2.0)
Acid-Base Excess: 1 mmol/L (ref 0.0–2.0)
Acid-Base Excess: 1 mmol/L (ref 0.0–2.0)
Acid-base deficit: 1 mmol/L (ref 0.0–2.0)
Bicarbonate: 27.2 mmol/L (ref 20.0–28.0)
Bicarbonate: 24 mmol/L (ref 20.0–28.0)
Bicarbonate: 24.3 mmol/L (ref 20.0–28.0)
Bicarbonate: 24 mmol/L (ref 20.0–28.0)
Bicarbonate: 25.2 mmol/L (ref 20.0–28.0)
Bicarbonate: 25.8 mmol/L (ref 20.0–28.0)
Bicarbonate: 24.1 mmol/L (ref 20.0–28.0)
Bicarbonate: 25.1 mmol/L (ref 20.0–28.0)
Calcium, Ion: 1.26 mmol/L (ref 1.15–1.40)
Calcium, Ion: 1.23 mmol/L (ref 1.15–1.40)
Calcium, Ion: 1.27 mmol/L (ref 1.15–1.40)
Calcium, Ion: 1.23 mmol/L (ref 1.15–1.40)
Calcium, Ion: 1.25 mmol/L (ref 1.15–1.40)
Calcium, Ion: 1.24 mmol/L (ref 1.15–1.40)
Calcium, Ion: 1.25 mmol/L (ref 1.15–1.40)
Calcium, Ion: 1.28 mmol/L (ref 1.15–1.40)
HCT: 28 % — ABNORMAL LOW (ref 39.0–52.0)
HCT: 26 % — ABNORMAL LOW (ref 39.0–52.0)
HCT: 28 % — ABNORMAL LOW (ref 39.0–52.0)
HCT: 29 % — ABNORMAL LOW (ref 39.0–52.0)
HCT: 28 % — ABNORMAL LOW (ref 39.0–52.0)
HCT: 29 % — ABNORMAL LOW (ref 39.0–52.0)
HCT: 26 % — ABNORMAL LOW (ref 39.0–52.0)
HCT: 23 % — ABNORMAL LOW (ref 39.0–52.0)
Hemoglobin: 9.5 g/dL — ABNORMAL LOW (ref 13.0–17.0)
Hemoglobin: 8.8 g/dL — ABNORMAL LOW (ref 13.0–17.0)
Hemoglobin: 9.5 g/dL — ABNORMAL LOW (ref 13.0–17.0)
Hemoglobin: 9.9 g/dL — ABNORMAL LOW (ref 13.0–17.0)
Hemoglobin: 9.5 g/dL — ABNORMAL LOW (ref 13.0–17.0)
Hemoglobin: 9.9 g/dL — ABNORMAL LOW (ref 13.0–17.0)
Hemoglobin: 8.8 g/dL — ABNORMAL LOW (ref 13.0–17.0)
Hemoglobin: 7.8 g/dL — ABNORMAL LOW (ref 13.0–17.0)
O2 Saturation: 92 %
O2 Saturation: 97 %
O2 Saturation: 90 %
O2 Saturation: 87 %
O2 Saturation: 92 %
O2 Saturation: 85 %
O2 Saturation: 91 %
O2 Saturation: 95 %
Patient temperature: 97.1
Patient temperature: 97.8
Patient temperature: 98.1
Patient temperature: 98
Patient temperature: 98.4
Patient temperature: 98.4
Patient temperature: 98.4
Patient temperature: 98.1
Potassium: 4.3 mmol/L (ref 3.5–5.1)
Potassium: 4.1 mmol/L (ref 3.5–5.1)
Potassium: 4 mmol/L (ref 3.5–5.1)
Potassium: 3.7 mmol/L (ref 3.5–5.1)
Potassium: 4.2 mmol/L (ref 3.5–5.1)
Potassium: 4.3 mmol/L (ref 3.5–5.1)
Potassium: 4 mmol/L (ref 3.5–5.1)
Potassium: 3.9 mmol/L (ref 3.5–5.1)
Sodium: 144 mmol/L (ref 135–145)
Sodium: 144 mmol/L (ref 135–145)
Sodium: 143 mmol/L (ref 135–145)
Sodium: 144 mmol/L (ref 135–145)
Sodium: 143 mmol/L (ref 135–145)
Sodium: 143 mmol/L (ref 135–145)
Sodium: 144 mmol/L (ref 135–145)
Sodium: 144 mmol/L (ref 135–145)
TCO2: 29 mmol/L (ref 22–32)
TCO2: 25 mmol/L (ref 22–32)
TCO2: 25 mmol/L (ref 22–32)
TCO2: 25 mmol/L (ref 22–32)
TCO2: 26 mmol/L (ref 22–32)
TCO2: 27 mmol/L (ref 22–32)
TCO2: 25 mmol/L (ref 22–32)
TCO2: 26 mmol/L (ref 22–32)
pCO2 arterial: 45 mmHg (ref 32.0–48.0)
pCO2 arterial: 31.8 mmHg — ABNORMAL LOW (ref 32.0–48.0)
pCO2 arterial: 36.7 mmHg (ref 32.0–48.0)
pCO2 arterial: 40.7 mmHg (ref 32.0–48.0)
pCO2 arterial: 42 mmHg (ref 32.0–48.0)
pCO2 arterial: 45.7 mmHg (ref 32.0–48.0)
pCO2 arterial: 34 mmHg (ref 32.0–48.0)
pCO2 arterial: 36.8 mmHg (ref 32.0–48.0)
pH, Arterial: 7.385 (ref 7.350–7.450)
pH, Arterial: 7.485 — ABNORMAL HIGH (ref 7.350–7.450)
pH, Arterial: 7.429 (ref 7.350–7.450)
pH, Arterial: 7.377 (ref 7.350–7.450)
pH, Arterial: 7.386 (ref 7.350–7.450)
pH, Arterial: 7.359 (ref 7.350–7.450)
pH, Arterial: 7.458 — ABNORMAL HIGH (ref 7.350–7.450)
pH, Arterial: 7.441 (ref 7.350–7.450)
pO2, Arterial: 62 mmHg — ABNORMAL LOW (ref 83.0–108.0)
pO2, Arterial: 86 mmHg (ref 83.0–108.0)
pO2, Arterial: 56 mmHg — ABNORMAL LOW (ref 83.0–108.0)
pO2, Arterial: 53 mmHg — ABNORMAL LOW (ref 83.0–108.0)
pO2, Arterial: 65 mmHg — ABNORMAL LOW (ref 83.0–108.0)
pO2, Arterial: 52 mmHg — ABNORMAL LOW (ref 83.0–108.0)
pO2, Arterial: 57 mmHg — ABNORMAL LOW (ref 83.0–108.0)
pO2, Arterial: 69 mmHg — ABNORMAL LOW (ref 83.0–108.0)

## 2019-08-07 LAB — LACTIC ACID, PLASMA: Lactic Acid, Venous: 1 mmol/L (ref 0.5–1.9)

## 2019-08-07 LAB — HEPARIN LEVEL (UNFRACTIONATED)
Heparin Unfractionated: 0.6 IU/mL (ref 0.30–0.70)
Heparin Unfractionated: 0.47 IU/mL (ref 0.30–0.70)
Heparin Unfractionated: 0.54 IU/mL (ref 0.30–0.70)

## 2019-08-07 LAB — BASIC METABOLIC PANEL
Anion gap: 5 (ref 5–15)
Anion gap: 7 (ref 5–15)
BUN: 27 mg/dL — ABNORMAL HIGH (ref 6–20)
BUN: 25 mg/dL — ABNORMAL HIGH (ref 6–20)
CO2: 25 mmol/L (ref 22–32)
CO2: 23 mmol/L (ref 22–32)
Calcium: 8.1 mg/dL — ABNORMAL LOW (ref 8.9–10.3)
Calcium: 8.5 mg/dL — ABNORMAL LOW (ref 8.9–10.3)
Chloride: 113 mmol/L — ABNORMAL HIGH (ref 98–111)
Chloride: 114 mmol/L — ABNORMAL HIGH (ref 98–111)
Creatinine, Ser: 0.57 mg/dL — ABNORMAL LOW (ref 0.61–1.24)
Creatinine, Ser: 0.66 mg/dL (ref 0.61–1.24)
GFR calc Af Amer: >60 mL/min (ref >60)
GFR calc Af Amer: >60 mL/min (ref >60)
GFR calc non Af Amer: >60 mL/min (ref >60)
GFR calc non Af Amer: >60 mL/min (ref >60)
Glucose, Bld: 90 mg/dL (ref 70–99)
Glucose, Bld: 117 mg/dL — ABNORMAL HIGH (ref 70–99)
Potassium: 4.4 mmol/L (ref 3.5–5.1)
Potassium: 4.1 mmol/L (ref 3.5–5.1)
Sodium: 143 mmol/L (ref 135–145)
Sodium: 144 mmol/L (ref 135–145)

## 2019-08-07 LAB — FIBRINOGEN: Fibrinogen: 145 mg/dL — ABNORMAL LOW (ref 210–475)

## 2019-08-07 LAB — CBC
HCT: 30.7 % — ABNORMAL LOW (ref 39.0–52.0)
Hemoglobin: 9.4 g/dL — ABNORMAL LOW (ref 13.0–17.0)
MCH: 27.8 pg (ref 26.0–34.0)
MCHC: 30.6 g/dL (ref 30.0–36.0)
MCV: 90.8 fL (ref 80.0–100.0)
Platelets: 62 10*3/uL — ABNORMAL LOW (ref 150–400)
RBC: 3.38 MIL/uL — ABNORMAL LOW (ref 4.22–5.81)
RDW: 15.2 % (ref 11.5–15.5)
WBC: 10 10*3/uL (ref 4.0–10.5)
nRBC: 0.2 % (ref 0.0–0.2)

## 2019-08-07 LAB — APTT
aPTT: 77 seconds — ABNORMAL HIGH (ref 24–36)
aPTT: 78 seconds — ABNORMAL HIGH (ref 24–36)
aPTT: 90 seconds — ABNORMAL HIGH (ref 24–36)

## 2019-08-07 LAB — COOXEMETRY PANEL
Carboxyhemoglobin: 2.3 % — ABNORMAL HIGH (ref 0.5–1.5)
Methemoglobin: 1.2 % (ref 0.0–1.5)
O2 Saturation: 98.7 %
Total hemoglobin: 9.8 g/dL — ABNORMAL LOW (ref 12.0–16.0)

## 2019-08-07 LAB — GLUCOSE, CAPILLARY
Glucose-Capillary: 96 mg/dL (ref 70–99)
Glucose-Capillary: 90 mg/dL (ref 70–99)
Glucose-Capillary: 102 mg/dL — ABNORMAL HIGH (ref 70–99)
Glucose-Capillary: 123 mg/dL — ABNORMAL HIGH (ref 70–99)
Glucose-Capillary: 136 mg/dL — ABNORMAL HIGH (ref 70–99)

## 2019-08-07 LAB — HEPATIC FUNCTION PANEL
ALT: 19 U/L (ref 0–44)
AST: 18 U/L (ref 15–41)
Albumin: 3.1 g/dL — ABNORMAL LOW (ref 3.5–5.0)
Alkaline Phosphatase: 32 U/L — ABNORMAL LOW (ref 38–126)
Bilirubin, Direct: 0.2 mg/dL (ref 0.0–0.2)
Indirect Bilirubin: 0.7 mg/dL (ref 0.3–0.9)
Total Bilirubin: 0.9 mg/dL (ref 0.3–1.2)
Total Protein: 4.8 g/dL — ABNORMAL LOW (ref 6.5–8.1)

## 2019-08-07 LAB — PROTIME-INR
INR: 1.4 — ABNORMAL HIGH (ref 0.8–1.2)
Prothrombin Time: 16.6 seconds — ABNORMAL HIGH (ref 11.4–15.2)

## 2019-08-07 LAB — LACTATE DEHYDROGENASE: LDH: 444 U/L — ABNORMAL HIGH (ref 98–192)

## 2019-08-07 LAB — TRIGLYCERIDES: Triglycerides: 216 mg/dL — ABNORMAL HIGH (ref <150)

## 2019-08-07 MED ORDER — ALBUMIN HUMAN 5 % IV SOLN
12.5000 g | Freq: Once | INTRAVENOUS | Status: AC
  Administered 2019-08-07: 12.5 g via INTRAVENOUS

## 2019-08-07 MED ORDER — LORAZEPAM 2 MG/ML IJ SOLN
4.0000 mg | Freq: Once | INTRAMUSCULAR | Status: AC
  Administered 2019-08-07: 4 mg via INTRAVENOUS

## 2019-08-07 MED ORDER — ALBUMIN HUMAN 5 % IV SOLN
INTRAVENOUS | Status: AC
  Administered 2019-08-07: 12.5 g
  Filled 2019-08-07: qty 250

## 2019-08-07 MED ORDER — FENTANYL 2500MCG IN NS 250ML (10MCG/ML) PREMIX INFUSION
0.0000 ug/h | INTRAVENOUS | Status: DC
  Administered 2019-08-07: 400 ug/h via INTRAVENOUS

## 2019-08-07 MED ORDER — ALBUMIN HUMAN 5 % IV SOLN
INTRAVENOUS | Status: AC
  Filled 2019-08-07: qty 250

## 2019-08-07 MED ORDER — PROPOFOL 1000 MG/100ML IV EMUL
5.0000 ug/kg/min | INTRAVENOUS | Status: DC
  Administered 2019-08-07: 20 ug/kg/min via INTRAVENOUS
  Administered 2019-08-07: 10 ug/kg/min via INTRAVENOUS
  Administered 2019-08-08 (×2): 40 ug/kg/min via INTRAVENOUS
  Administered 2019-08-08 (×3): 20 ug/kg/min via INTRAVENOUS
  Administered 2019-08-09: 35 ug/kg/min via INTRAVENOUS
  Administered 2019-08-09: 20 ug/kg/min via INTRAVENOUS
  Administered 2019-08-09: 40 ug/kg/min via INTRAVENOUS
  Administered 2019-08-10 (×3): 60 ug/kg/min via INTRAVENOUS
  Administered 2019-08-10: 02:00:00 35 ug/kg/min via INTRAVENOUS
  Administered 2019-08-10: 09:00:00 40 ug/kg/min via INTRAVENOUS
  Administered 2019-08-10 – 2019-08-11 (×4): 60 ug/kg/min via INTRAVENOUS
  Administered 2019-08-11: 50 ug/kg/min via INTRAVENOUS
  Administered 2019-08-11: 60 ug/kg/min via INTRAVENOUS
  Administered 2019-08-12: 80 ug/kg/min via INTRAVENOUS
  Administered 2019-08-12: 27.322 ug/kg/min via INTRAVENOUS
  Administered 2019-08-12 (×2): 80 ug/kg/min via INTRAVENOUS
  Administered 2019-08-12: 60 ug/kg/min via INTRAVENOUS
  Administered 2019-08-12: 70 ug/kg/min via INTRAVENOUS
  Administered 2019-08-12: 80 ug/kg/min via INTRAVENOUS
  Administered 2019-08-12 – 2019-08-13 (×2): 60 ug/kg/min via INTRAVENOUS
  Administered 2019-08-13 (×2): 80 ug/kg/min via INTRAVENOUS
  Administered 2019-08-13: 70 ug/kg/min via INTRAVENOUS
  Administered 2019-08-13: 80 ug/kg/min via INTRAVENOUS
  Administered 2019-08-13 (×3): 60 ug/kg/min via INTRAVENOUS
  Administered 2019-08-13: 80 ug/kg/min via INTRAVENOUS
  Administered 2019-08-13 – 2019-08-14 (×4): 60 ug/kg/min via INTRAVENOUS
  Administered 2019-08-14 (×3): 30 ug/kg/min via INTRAVENOUS
  Administered 2019-08-14: 50 ug/kg/min via INTRAVENOUS
  Administered 2019-08-14: 27.322 ug/kg/min via INTRAVENOUS
  Administered 2019-08-14: 60 ug/kg/min via INTRAVENOUS
  Administered 2019-08-15: 30 ug/kg/min via INTRAVENOUS
  Administered 2019-08-15 (×3): 25 ug/kg/min via INTRAVENOUS
  Administered 2019-08-15: 27.322 ug/kg/min via INTRAVENOUS
  Administered 2019-08-16: 50 ug/kg/min via INTRAVENOUS
  Administered 2019-08-16: 60 ug/kg/min via INTRAVENOUS
  Administered 2019-08-16: 50 ug/kg/min via INTRAVENOUS
  Administered 2019-08-16 (×2): 60 ug/kg/min via INTRAVENOUS
  Administered 2019-08-16: 40 ug/kg/min via INTRAVENOUS
  Administered 2019-08-16: 35 ug/kg/min via INTRAVENOUS
  Administered 2019-08-16: 40 ug/kg/min via INTRAVENOUS
  Administered 2019-08-16: 50 ug/kg/min via INTRAVENOUS
  Administered 2019-08-17 (×2): 80 ug/kg/min via INTRAVENOUS
  Administered 2019-08-17: 50 ug/kg/min via INTRAVENOUS
  Administered 2019-08-17: 80 ug/kg/min via INTRAVENOUS
  Administered 2019-08-17: 50 ug/kg/min via INTRAVENOUS
  Administered 2019-08-17 (×2): 80 ug/kg/min via INTRAVENOUS
  Administered 2019-08-17 (×2): 50 ug/kg/min via INTRAVENOUS
  Administered 2019-08-17 – 2019-08-18 (×13): 80 ug/kg/min via INTRAVENOUS
  Administered 2019-08-18: 27.322 ug/kg/min via INTRAVENOUS
  Administered 2019-08-18 (×4): 80 ug/kg/min via INTRAVENOUS
  Administered 2019-08-19: 60 ug/kg/min via INTRAVENOUS
  Administered 2019-08-19 (×2): 80 ug/kg/min via INTRAVENOUS
  Administered 2019-08-19: 75 ug/kg/min via INTRAVENOUS
  Administered 2019-08-19 (×2): 80 ug/kg/min via INTRAVENOUS
  Administered 2019-08-19: 70 ug/kg/min via INTRAVENOUS
  Administered 2019-08-19: 80 ug/kg/min via INTRAVENOUS
  Filled 2019-08-07: qty 200
  Filled 2019-08-07 (×3): qty 100
  Filled 2019-08-07: qty 200
  Filled 2019-08-07: qty 100
  Filled 2019-08-07: qty 300
  Filled 2019-08-07 (×3): qty 100
  Filled 2019-08-07: qty 200
  Filled 2019-08-07 (×4): qty 100
  Filled 2019-08-07: qty 200
  Filled 2019-08-07: qty 400
  Filled 2019-08-07: qty 100
  Filled 2019-08-07: qty 200
  Filled 2019-08-07 (×2): qty 100
  Filled 2019-08-07: qty 200
  Filled 2019-08-07 (×4): qty 100
  Filled 2019-08-07: qty 200
  Filled 2019-08-07 (×2): qty 100
  Filled 2019-08-07: qty 200
  Filled 2019-08-07 (×8): qty 100
  Filled 2019-08-07 (×5): qty 200
  Filled 2019-08-07: qty 100
  Filled 2019-08-07 (×2): qty 200
  Filled 2019-08-07: qty 300
  Filled 2019-08-07: qty 100
  Filled 2019-08-07 (×2): qty 200
  Filled 2019-08-07 (×4): qty 100
  Filled 2019-08-07: qty 200
  Filled 2019-08-07 (×3): qty 100
  Filled 2019-08-07: qty 200
  Filled 2019-08-07: qty 100
  Filled 2019-08-07: qty 200
  Filled 2019-08-07 (×10): qty 100
  Filled 2019-08-07 (×2): qty 200
  Filled 2019-08-07 (×3): qty 100
  Filled 2019-08-07: qty 200
  Filled 2019-08-07 (×3): qty 100

## 2019-08-07 MED ORDER — OXYCODONE HCL 5 MG PO TABS
10.0000 mg | ORAL_TABLET | Freq: Four times a day (QID) | ORAL | Status: DC
  Administered 2019-08-07 – 2019-08-08 (×5): 10 mg via ORAL
  Filled 2019-08-07 (×5): qty 2

## 2019-08-07 MED ORDER — HYDRALAZINE HCL 20 MG/ML IJ SOLN
5.0000 mg | INTRAMUSCULAR | Status: DC | PRN
  Administered 2019-08-07 – 2019-09-02 (×17): 5 mg via INTRAVENOUS
  Filled 2019-08-07 (×20): qty 1

## 2019-08-07 MED ORDER — METOCLOPRAMIDE HCL 5 MG/ML IJ SOLN
INTRAMUSCULAR | Status: AC
  Filled 2019-08-07: qty 2

## 2019-08-07 MED ORDER — LORAZEPAM 2 MG/ML IJ SOLN
0.5000 mg/h | INTRAVENOUS | Status: DC
  Administered 2019-08-07: 20 mg/h via INTRAVENOUS
  Administered 2019-08-07: 0.5 mg/h via INTRAVENOUS
  Filled 2019-08-07 (×2): qty 25

## 2019-08-07 MED ORDER — PROPOFOL 1000 MG/100ML IV EMUL
INTRAVENOUS | Status: AC
  Filled 2019-08-07: qty 100

## 2019-08-07 MED ORDER — ALBUMIN HUMAN 25 % IV SOLN: 12.5000 g | Freq: Once | INTRAVENOUS | Status: DC

## 2019-08-07 MED ORDER — QUETIAPINE FUMARATE 25 MG PO TABS
25.0000 mg | ORAL_TABLET | Freq: Two times a day (BID) | ORAL | Status: DC
  Administered 2019-08-07 – 2019-08-08 (×3): 25 mg via ORAL
  Filled 2019-08-07 (×3): qty 1

## 2019-08-07 MED ORDER — ROCURONIUM BROMIDE 50 MG/5ML IV SOLN
100.0000 mg | Freq: Once | INTRAVENOUS | Status: AC
  Administered 2019-08-07: 100 mg via INTRAVENOUS

## 2019-08-07 MED ORDER — METOCLOPRAMIDE HCL 5 MG/ML IJ SOLN
10.0000 mg | Freq: Once | INTRAMUSCULAR | Status: AC
  Administered 2019-08-07: 10 mg via INTRAVENOUS

## 2019-08-07 MED ORDER — FUROSEMIDE 10 MG/ML IJ SOLN
10.0000 mg | Freq: Two times a day (BID) | INTRAMUSCULAR | Status: AC
  Administered 2019-08-07: 10 mg via INTRAVENOUS
  Filled 2019-08-07: qty 2

## 2019-08-07 MED ORDER — MIDAZOLAM 50MG/50ML (1MG/ML) PREMIX INFUSION
1.0000 mg/h | INTRAVENOUS | Status: DC
  Administered 2019-08-07: 25 mg/h via INTRAVENOUS
  Administered 2019-08-07: 10 mg/h via INTRAVENOUS
  Administered 2019-08-07 – 2019-08-08 (×11): 25 mg/h via INTRAVENOUS
  Filled 2019-08-07 (×4): qty 50
  Filled 2019-08-07: qty 100
  Filled 2019-08-07 (×5): qty 50
  Filled 2019-08-07: qty 100
  Filled 2019-08-07 (×2): qty 50

## 2019-08-07 MED ORDER — GABAPENTIN 250 MG/5ML PO SOLN
300.0000 mg | Freq: Three times a day (TID) | ORAL | Status: DC
  Administered 2019-08-07 – 2019-08-08 (×3): 300 mg via ORAL
  Filled 2019-08-07 (×5): qty 6

## 2019-08-07 MED ORDER — ACETAMINOPHEN 325 MG PO TABS
650.0000 mg | ORAL_TABLET | Freq: Four times a day (QID) | ORAL | Status: DC | PRN
  Administered 2019-11-02 – 2019-11-14 (×4): 650 mg
  Filled 2019-08-07 (×4): qty 2

## 2019-08-07 MED ORDER — CLONAZEPAM 1 MG PO TABS
2.0000 mg | ORAL_TABLET | Freq: Two times a day (BID) | ORAL | Status: DC
  Administered 2019-08-07 – 2019-08-08 (×3): 2 mg via ORAL
  Filled 2019-08-07 (×3): qty 2

## 2019-08-07 MED ORDER — SENNOSIDES-DOCUSATE SODIUM 8.6-50 MG PO TABS
1.0000 | ORAL_TABLET | Freq: Two times a day (BID) | ORAL | Status: DC
  Administered 2019-08-07 – 2019-08-14 (×13): 1
  Filled 2019-08-07 (×13): qty 1

## 2019-08-07 MED ORDER — ALBUMIN HUMAN 5 % IV SOLN
INTRAVENOUS | Status: AC
  Administered 2019-08-07: 12.5 g via INTRAVENOUS
  Filled 2019-08-07: qty 250

## 2019-08-07 MED ORDER — LORAZEPAM 2 MG/ML IJ SOLN
INTRAMUSCULAR | Status: AC
  Filled 2019-08-07: qty 2

## 2019-08-07 MED ORDER — HYDROMORPHONE BOLUS VIA INFUSION
0.5000 mg | INTRAVENOUS | Status: DC | PRN
  Administered 2019-08-07 – 2019-09-17 (×35): 0.5 mg via INTRAVENOUS
  Filled 2019-08-07: qty 1

## 2019-08-07 MED ORDER — HYDROMORPHONE HCL 1 MG/ML IJ SOLN
1.0000 mg | Freq: Once | INTRAMUSCULAR | Status: AC
  Administered 2019-08-07: 1 mg via INTRAVENOUS
  Filled 2019-08-07: qty 1

## 2019-08-07 MED ORDER — SODIUM CHLORIDE 0.9 % IV SOLN
0.5000 mg/h | INTRAVENOUS | Status: DC
  Administered 2019-08-07: 4 mg/h via INTRAVENOUS
  Administered 2019-08-07: 0.5 mg/h via INTRAVENOUS
  Administered 2019-08-08 – 2019-08-10 (×4): 4 mg/h via INTRAVENOUS
  Administered 2019-08-11: 2 mg/h via INTRAVENOUS
  Administered 2019-08-11 – 2019-08-12 (×2): 4 mg/h via INTRAVENOUS
  Administered 2019-08-12 – 2019-08-14 (×5): 6 mg/h via INTRAVENOUS
  Administered 2019-08-14: 5 mg/h via INTRAVENOUS
  Administered 2019-08-14 – 2019-08-16 (×2): 6 mg/h via INTRAVENOUS
  Administered 2019-08-16 (×2): 5 mg/h via INTRAVENOUS
  Administered 2019-08-17 (×3): 12 mg/h via INTRAVENOUS
  Administered 2019-08-17: 5 mg/h via INTRAVENOUS
  Administered 2019-08-18 – 2019-08-19 (×10): 12 mg/h via INTRAVENOUS
  Filled 2019-08-07 (×55): qty 5

## 2019-08-07 NOTE — Progress Notes (Signed)
30 mL Ativan and 50 mL Fentanyl wasted in stericycle with Darrold Span as witness.

## 2019-08-07 NOTE — Progress Notes (Signed)
Throughout shift, patient's sedation has been changed. Plan is now to use versed, dilaudid, and propofol drips. Will work on Chief Technology Officer and fentanyl drips off. Smith MD notified of issues throughout shift. Multiple boluses given. Nasal packing has now been placed to help with bleeding from nose. Right chest tube oozing around site pretty significantly. Atkins MD aware.

## 2019-08-07 NOTE — Progress Notes (Signed)
Nutrition Follow up  DOCUMENTATION CODES:   Obesity unspecified  INTERVENTION:   Monitor for BM- day 6 without  Tube feeding:  -Pivot 1.5 @ 40 ml/hr via Cortrak  -Increase by 10 ml Q6 hours to goal rate of 65 ml/hr (1560 ml) -30 ml Prostat BID -Free water flushes 200 ml Q8 hours-per CCM  At goal rate TF provides: 2540 kcals, 176 grams protein, 1186 ml free water (1786 ml with flushes). Meets 100% of needs.   NUTRITION DIAGNOSIS:   Increased nutrient needs related to acute illness(COVID PNA) as evidenced by estimated needs.  Ongoing  GOAL:   Patient will meet greater than or equal to 90% of their needs   Addressed via TF  MONITOR:   Diet advancement, Skin, TF tolerance, Weight trends, Labs, I & O's  REASON FOR ASSESSMENT:   Consult Enteral/tube feeding initiation and management  ASSESSMENT:   Patient with PMH significant for GERD, HTN, and asthma. Presents this admission with COVID-19 PNA.   12/14- admit 12/23- chest xray revealed pneumomediastinum  12/24- developed bilateral pneumothoraces  12/26- worsening hypoxemia, intubated  12/27- VV fem/fem ECMO cannulation  12/29- trach   Pt discussed during ICU rounds and with RN.   Continues on ECMO. Unable to prone given catheter position. Off pressors/nimbex. Ketamine d/c.  Post-pyloric Cortrak placed this am. Will titrate feeding to goal. No BM documented in 6 days- regimen ordered.   Admission weight: 136.1 kg  Current weight: 122 kg (down 6 kg in last two days)  Patient requiring ventilator support via trach  MV: 2.4 L/min Temp (24hrs), Avg:97.7 F (36.5 C), Min:97.1 F (36.2 C), Max:98.3 F (36.8 C)   I/O: +109 ml since 12/16 UOP: 3,925 ml x 24 hrs Chest tubes: 280 ml x 24 hrs   Drips: fentanyl, albumin  Medications: 500 mg Vit C daily, decadron, 10 mg lasix BID, SS novolog, senokot, 220 mg zinc sulfate daily  Labs: CBG 90-176   Diet Order:   Diet Order            Diet NPO time specified  Diet  effective midnight              EDUCATION NEEDS:   Not appropriate for education at this time  Skin:  Skin Assessment: Reviewed RN Assessment  Last BM:  12/24  Height:   Ht Readings from Last 1 Encounters:  08/05/19 5' 8"  (1.727 m)    Weight:   Wt Readings from Last 1 Encounters:  08/07/19 122 kg    Ideal Body Weight:  70 kg  BMI:  Body mass index is 40.9 kg/m.  Estimated Nutritional Needs:   Kcal:  3953-2023 kcal (20-24 kcal/kg)  Protein:  140-175 grams  Fluid:  >/= 2 L/day   [name] RD, LDN Clinical Nutrition Pager # - [phone]

## 2019-08-07 NOTE — Plan of Care (Signed)
  Problem: Respiratory: Goal: Will maintain a patent airway Outcome: Progressing   Problem: Respiratory: Goal: Ability to maintain a clear airway and adequate ventilation will improve Outcome: Progressing   Problem: Role Relationship: Goal: Method of communication will improve Outcome: Not Progressing

## 2019-08-07 NOTE — Progress Notes (Signed)
43m Ketamine wasted in stericycle with Emer C. RN

## 2019-08-07 NOTE — Progress Notes (Signed)
PT Cancellation Note  Patient Details Name: [name] MRN: [id number] DOB: 1976/06/04   Cancelled Treatment:    Per discussion with RN patient is fighting ventilator after recent attempts to wean paralytics. Pt not medically appropriate at this time. Pt will follow up when patient is medically ready to participate in skilled PT intervention.   Zenaida Niece 08/07/2019, 1:18 PM

## 2019-08-07 NOTE — Progress Notes (Signed)
Patient with significant bleeding from nose.  Dr. Erskine Emery notified; coming to bedside to place nasal packing.

## 2019-08-07 NOTE — Progress Notes (Signed)
SLP Cancellation Note  Patient Details Name: [name] MRN: [id number] DOB: 1975/11/08   Cancelled treatment:      Patient with new tracheostomy. Orders for SLP eval and treat for PMSV and swallowing received. Will follow pt closely for readiness for SLP interventions as appropriate.    [name]., M.A. [facility] Pager [phone] Office [phone]  08/07/2019, 7:24 AM

## 2019-08-07 NOTE — Progress Notes (Signed)
Patient ID: [name], male   DOB: [birth year], 43 y.o.   MRN: [id number]    Advanced Heart Failure Rounding Note   Subjective:    Stable on VV ecmo with good flows and oxygen saturation 90s. Off vasopressin. SBP up to 170s. Lactate normal. On Nimbex, fentanyl, Versed, Ketamine for sedation.   CXR: Bilateral lung opacities c/w ARDS, slight progression  I/Os even, CVP 10.  He responds very vigorously to even low dose Lasix, needs albumin.  Stable creatinine.   Platelets 76=>71=>62, LDH 562=>538=>444.    Afebrile, stopped vancomycin yesterday and completes cefepime today with WBCs 10.    On vent 40% PEEP 10. Bilateral CTs in for PTXs  ECMO parameters Flow 4.9 RPM 3400 dP 17 Pressure -97   Objective:   Weight Range:  Vital Signs:   Temp:  [97.1 F (36.2 C)-98.3 F (36.8 C)] 97.1 F (36.2 C) (12/30 0400) Pulse Rate:  [58-90] 60 (12/30 0600) Resp:  [0-32] 9 (12/30 0600) BP: (113-164)/(52-79) 121/69 (12/30 0600) SpO2:  [88 %-100 %] 100 % (12/30 0600) Arterial Line BP: (131-198)/(57-86) 144/67 (12/30 0600) FiO2 (%):  [40 %] 40 % (12/30 0400) Weight:  [122 kg] 122 kg (12/30 0421) Last BM Date: (PTA)  Weight change: Filed Weights   08/05/19 0500 08/06/19 0500 08/07/19 0421  Weight: 128.2 kg 125.2 kg 122 kg    Intake/Output:   Intake/Output Summary (Last 24 hours) at 08/07/2019 0736 Last data filed at 08/07/2019 0600 Gross per 24 hour  Intake 4030.33 ml  Output 4205 ml  Net -174.67 ml     Physical Exam: General: NAD, sedated on vent Neck: Thick, JVP difficult, no thyromegaly or thyroid nodule.  Lungs: Clear to auscultation bilaterally with normal respiratory effort. CV: Nondisplaced PMI.  Heart regular S1/S2, no S3/S4, no murmur.  1+ ankle edema.  No carotid bruit.  Normal pedal pulses.  Abdomen: Soft, nontender, no hepatosplenomegaly, no distention.  Skin: Intact without lesions or rashes.  Neurologic: Sedated Extremities: No clubbing or cyanosis.   HEENT: Normal.    Telemetry: Sinus 70-80s Personally reviewed  Labs: Basic Metabolic Panel: Recent Labs  Lab 08/02/19 0655 08/03/19 1040 08/03/19 1542 08/03/19 2131 08/03/19 2222 08/04/19 1644 08/05/19 0416 08/05/19 1622 08/06/19 0457 08/06/19 1644 08/06/19 1954 08/07/19 0344 08/07/19 0429 08/07/19 0612  NA 137  --   --   --   --  136 139 140 143  145 143 144 143 144 144  K 4.0  --   --   --   --  4.7 3.9 4.5 4.2  4.2 4.7 4.5 4.4 4.3 4.1  CL 100  --    < >  --    < > 108 111 110 111 112*  --  113*  --   --   CO2 24  --    < >  --    < > _0 --  25  --   --   GLUCOSE 84  --    < >  --    < > 167* 136* 141* 97 117*  --  90  --   --   BUN 35*  --    < >  --    < > 39* 41* 36* 31* 28*  --  27*  --   --   CREATININE 0.91  --    < >  --    < > 0.74 0.68 0.69 0.61 0.69  --  0.57*  --   --  CALCIUM 8.6*  --    < >  --    < > 7.5* 7.8* 8.0* 8.2* 8.2*  --  8.1*  --   --   MG 2.1 2.4  --   --   --  2.8*  --   --   --   --   --   --   --   --   PHOS 4.3 7.1*  --  4.2  --  2.7  --   --   --   --   --   --   --   --    < > = values in this interval not displayed.    Liver Function Tests: Recent Labs  Lab 08/03/19 2108 08/04/19 0507 08/05/19 0416 08/06/19 0457 08/07/19 0344  AST _0 ALT _1 ALKPHOS 77 52 46 38 32*  BILITOT 0.8 1.3* 1.0 0.8 0.9  PROT 4.6* 4.6* 4.5* 4.8* 4.8*  ALBUMIN 2.4* 2.7* 2.7* 2.8* 3.1*   No results for input(s): LIPASE, AMYLASE in the last 168 hours. No results for input(s): AMMONIA in the last 168 hours.  CBC: Recent Labs  Lab 08/02/19 0655 08/03/19 0946 08/05/19 0416 08/05/19 1622 08/06/19 0457 08/06/19 1644 08/06/19 1954 08/07/19 0344 08/07/19 0429 08/07/19 0612  WBC 15.2*   < > 12.5* 13.4* 13.1* 10.4  --  10.0  --   --   NEUTROABS 11.7*  --   --   --   --   --   --   --   --   --   HGB 16.4  --  11.4* 11.2* 10.8*  10.5* 9.9* 9.5* 9.4* 9.5* 8.8*  HCT 50.4  --  35.3* 35.2* 34.3*  31.0* 31.7*  28.0* 30.7* 28.0* 26.0*  MCV 84.3   < > 86.3 87.8 88.4 88.8  --  90.8  --   --   PLT 185   < > 75* 70* 71* 65*  --  62*  --   --    < > = values in this interval not displayed.    Cardiac Enzymes: No results for input(s): CKTOTAL, CKMB, CKMBINDEX, TROPONINI in the last 168 hours.  BNP: BNP (last 3 results) Recent Labs    07/22/19 1039  BNP 15.3    ProBNP (last 3 results) No results for input(s): PROBNP in the last 8760 hours.    Other results:  Imaging: DG Chest 1 View  Result Date: 08/07/2019 CLINICAL DATA:  Chest tube in place. Tracheostomy. EXAM: CHEST  1 VIEW COMPARISON:  Radiograph yesterday. FINDINGS: Tracheostomy tube tip at the thoracic inlet. Bilateral chest tubes in place. Left internal jugular central line tip in the upper SVC. ECMO catheter is noted in the IVC and atrial caval junction. No evidence of pneumothorax. Cardiomegaly. Low lung volumes. Diffuse bilateral airspace disease with slight worsening in the interim. IMPRESSION: 1. Tracheostomy tube tip at the thoracic inlet. 2. Bilateral chest tubes in place. No pneumothorax. 3. Extensive bilateral lung opacification, slight progression from yesterday. Electronically Signed   By: Keith Rake M.D.   On: 08/07/2019 06:55   DG Chest Port 1 View  Result Date: 08/06/2019 CLINICAL DATA:  Status post tracheostomy. EXAM: PORTABLE CHEST 1 VIEW COMPARISON:  Chest x-ray from same day. FINDINGS: New tracheostomy tube with the tip at the thoracic inlet. Interval removal of the enteric tube. Unchanged left internal jugular central venous catheter and bilateral chest tubes. Unchanged ECMO catheter with  tip projecting over the inferior cavoatrial junction. The cardiomediastinal silhouette remains obscured. Persistent low lung volumes with diffuse bilateral airspace disease, similar to prior study. No definite pneumothorax. No acute osseous abnormality. IMPRESSION: 1. Appropriately positioned tracheostomy tube. 2. Unchanged  extensive bilateral pulmonary airspace disease. Electronically Signed   By: Titus Dubin M.D.   On: 08/06/2019 14:39   DG CHEST PORT 1 VIEW  Result Date: 08/06/2019 CLINICAL DATA:  Endotracheal tube. Chest tube. Cardiorespiratory failure. On ECMO. EXAM: PORTABLE CHEST 1 VIEW COMPARISON:  Radiograph yesterday. CT 07/30/2019 FINDINGS: Endotracheal tube, enteric tube, left central line and bilateral chest tubes remain in place. ECMO catheter faintly visualized projecting over the atrial IVC junction. Additional ECMO catheter in the upper IVC. Multiple additional overlying monitoring devices in place. Progressive diffuse airspace opacities since yesterday. Cardiomediastinal contours are obscured. No visualized pneumothorax. IMPRESSION: 1. Progressive bilateral lung opacities since yesterday which may represent pneumonia, ARDS, or combination thereof. 2. Support apparatus unchanged. Electronically Signed   By: Keith Rake M.D.   On: 08/06/2019 06:40   DG CHEST PORT 1 VIEW  Result Date: 08/05/2019 CLINICAL DATA:  Endotracheal tube exchange EXAM: PORTABLE CHEST 1 VIEW COMPARISON:  08/05/2019 at 0558 hours FINDINGS: Endotracheal tube now projects within the right mainstem bronchus. Enteric tube courses below the diaphragm with distal tip and side hole beyond the inferior margin of the film. Left IJ central venous catheter is unchanged in positioning. Bilateral chest tubes remain in place. Stable cardiomediastinal contours. Diffuse patchy bilateral airspace opacities. No pneumothorax is evident. IMPRESSION: 1. Endotracheal tube tip now projects within the right mainstem bronchus. Recommend retraction 4-5 cm. 2. Stable appearance of the chest with diffuse patchy bilateral airspace opacities. 3. No pneumothorax. These results will be called to the ordering clinician or representative by the Radiologist Assistant, and communication documented in the PACS or zVision Dashboard. Electronically Signed   By:  Davina Poke D.O.   On: 08/05/2019 08:08     Medications:     Scheduled Medications: . artificial tears  1 application Both Eyes W2N  . vitamin C  500 mg Per Tube Daily  . chlorhexidine gluconate (MEDLINE KIT)  15 mL Mouth Rinse BID  . chlorhexidine gluconate (MEDLINE KIT)  15 mL Mouth Rinse BID  . Chlorhexidine Gluconate Cloth  6 each Topical Daily  . dexamethasone (DECADRON) injection  3 mg Intravenous Q24H   Followed by  . [START ON 08/13/2019] dexamethasone (DECADRON) injection  1 mg Intravenous Q24H  . feeding supplement (PRO-STAT SUGAR FREE 64)  30 mL Per Tube BID  . free water  200 mL Per Tube Q8H  . insulin aspart  1 Units Subcutaneous Q4H  . insulin aspart  3-9 Units Subcutaneous Q4H  . magnesium hydroxide  15 mL Oral Daily  . mouth rinse  15 mL Mouth Rinse BID  . mouth rinse  15 mL Mouth Rinse 10 times per day  . pantoprazole (PROTONIX) IV  40 mg Intravenous QHS  . senna-docusate  1 tablet Oral BID  . sodium chloride flush  10-40 mL Intracatheter Q12H  . zinc sulfate  220 mg Per Tube Daily    Infusions: . sodium chloride    . sodium chloride 10 mL/hr at 08/07/19 0600  . ceFEPime (MAXIPIME) IV 2 g (08/07/19 0622)  . dextrose    . feeding supplement (PIVOT 1.5 CAL)    . fentaNYL infusion INTRAVENOUS 400 mcg/hr (08/07/19 0600)  . heparin 1,750 Units/hr (08/07/19 0600)  . ketamine (KETALAR) Adult IV Infusion  1 mg/kg/hr (08/07/19 0600)  . midazolam 10 mg/hr (08/07/19 0600)    PRN Medications: Place/Maintain arterial line **AND** sodium chloride, sodium chloride, acetaminophen, dextrose, fentaNYL, fentaNYL, metoprolol tartrate, midazolam, ondansetron (ZOFRAN) IV, sodium chloride, sodium chloride flush   Assessment/Plan:    1. Acute hypoxic/hypercarbic respiratory failure due to COVID PNA: Remained markedly hypoxic despite full support. Intubated 12/26. S/p bilateral CTs 12/26 for pneumothoraces.  TEE on 12/26 LVEF 70% RV ok. VV ECMO begun 12/26. Day #5 VV ECMO.  Tracheostomy 12/29.  COVID ARDS at this point.  Flows stable. Oxygen saturation stable, good color change between cannulas, suspect level of mixing acceptable.  Low level hemolysis, plts lower but LDH lower today. Good I/Os with Lasix IV yesterday, but has aggressive response and requires albumin.  - Lasix 10 mg IV bid today, aim to keep even to slightly negative.  - Heparin gtt per pharmacy.  2. Bilateral PTX: Due to barotrauma. CTs in place 3. COVID PNA: management of COVID infection per CCM. CXR with bilateral multifocal PNA progressing to likely ARDS.  - s/p 10 days of remdesivir - 12/16, 12/2 s/p tociluzimab - 12/17 s/p convalescent plasma - On decadron - To finish abx today.  4. ID: Empiric coverage with vancomycin/cefepime for possible secondary bacterial PNA, courses finish today.  5. Thrombocytopenia: Slow decrease in platelets, likely low level hemolysis + critical illness.   CRITICAL CARE Performed by: Loralie Champagne  Total critical care time: 35 minutes  Critical care time was exclusive of separately billable procedures and treating other patients.  Critical care was necessary to treat or prevent imminent or life-threatening deterioration.  Critical care was time spent personally by me (independent of midlevel providers or residents) on the following activities: development of treatment plan with patient and/or surrogate as well as nursing, discussions with consultants, evaluation of patient's response to treatment, examination of patient, obtaining history from patient or surrogate, ordering and performing treatments and interventions, ordering and review of laboratory studies, ordering and review of radiographic studies, pulse oximetry and re-evaluation of patient's condition.    Length of Stay: 16   Loralie Champagne MD 08/07/2019, 7:36 AM  Advanced Heart Failure Team Pager [phone] (M-F; [place])  Please contact [facility] for night-coverage after hours (4p -7a ) and  weekends on [email or web address]

## 2019-08-07 NOTE — Progress Notes (Addendum)
ANTICOAGULATION CONSULT NOTE -  Pharmacy Consult for heparin Indication: ECMO  No Known Allergies  Patient Measurements: Height: _0  (172.7 cm) Weight: 268 lb 15.4 oz (122 kg) IBW/kg (Calculated) : 68.4 Heparin Dosing Weight: 100.7 kg  Vital Signs: Temp: 97.1 F (36.2 C) (12/30 0400) Temp Source: Axillary (12/30 0400) BP: 121/69 (12/30 0600) Pulse Rate: 60 (12/30 0600)  Labs: Recent Labs    08/05/19 0416 08/05/19 0426 08/05/19 1622 08/06/19 0002 08/06/19 0457 08/06/19 1644 08/06/19 2015 08/07/19 0344 08/07/19 0429 08/07/19 0612  HGB 11.4*  --    < >  --  10.8*  10.5* 9.9*  --  9.4* 9.5* 8.8*  HCT 35.3*  --    < >  --  34.3*  31.0* 31.7*  --  30.7* 28.0* 26.0*  PLT 75*   < >  --   --  71* 65*  --  62*  --   --   APTT 53*   < >  --  71* 73*  --   --  77*  --   --   LABPROT 16.9*  --   --   --  16.1*  --   --  16.6*  --   --   INR 1.4*  --   --   --  1.3*  --   --  1.4*  --   --   HEPARINUNFRC 0.49   < >   < >  --  0.72*  --  0.47 0.60  --   --   CREATININE 0.68   < >  --   --  0.61 0.69  --  0.57*  --   --    < > = values in this interval not displayed.    Estimated Creatinine Clearance: 148.1 mL/min (A) (by C-G formula based on SCr of 0.57 mg/dL (L)).   Medical History: Past Medical History:  Diagnosis Date  . Asthma   . GERD (gastroesophageal reflux disease)   . Headache   . History of kidney stones    LEFT URETERAL STONE  . HTN (hypertension)   . Pancreatitis 2018   GALLBALDDER SLUDGE CAUSED ISSUED RESOLVED    Medications:  Scheduled:  . artificial tears  1 application Both Eyes E4M  . vitamin C  500 mg Per Tube Daily  . chlorhexidine gluconate (MEDLINE KIT)  15 mL Mouth Rinse BID  . chlorhexidine gluconate (MEDLINE KIT)  15 mL Mouth Rinse BID  . Chlorhexidine Gluconate Cloth  6 each Topical Daily  . dexamethasone (DECADRON) injection  3 mg Intravenous Q24H   Followed by  . [START ON 08/13/2019] dexamethasone (DECADRON) injection  1 mg  Intravenous Q24H  . feeding supplement (PRO-STAT SUGAR FREE 64)  30 mL Per Tube BID  . free water  200 mL Per Tube Q8H  . insulin aspart  1 Units Subcutaneous Q4H  . insulin aspart  3-9 Units Subcutaneous Q4H  . magnesium hydroxide  15 mL Oral Daily  . mouth rinse  15 mL Mouth Rinse BID  . mouth rinse  15 mL Mouth Rinse 10 times per day  . pantoprazole (PROTONIX) IV  40 mg Intravenous QHS  . senna-docusate  1 tablet Oral BID  . sodium chloride flush  10-40 mL Intracatheter Q12H  . zinc sulfate  220 mg Per Tube Daily    Assessment: [name] known COVID+, has been declining with ARDS for past 48 hours requiring intubation and max vent settings. Underwent VV ECMO on 12/26  and is now on IV heparin.  Heparin level is therapeutic at 0.6, aPTT 77 seconds, CBC stable.    Goal of Therapy:  Heparin level 0.5-0.7 units/ml >>targeting at higher end of goal range aPTT 66-102 seconds  Monitor platelets by anticoagulation protocol: Yes   Plan:  -Continue heparin 1750 units/hr -Continue 5a/5p heparin level checks  ADDENDUM: concern for administration error so heparin level rechecked. Resulted as normal but less than goal at 0.47. Will increase to 1800 units/hr and recheck at usual scheduled time of 1700.   Arrie Senate, PharmD, BCPS Clinical Pharmacist [phone] Please check AMION for all [facility] numbers 08/07/2019

## 2019-08-07 NOTE — Progress Notes (Signed)
NAME:  [name], MRN:  [id number], DOB:  November 03, 1975, LOS: 51 ADMISSION DATE:  07/22/2019, CONSULTATION DATE:  12/24 REFERRING MD:  Sloan Leiter, CHIEF COMPLAINT:  Dyspnea   Brief History   [name] admitted 12/14 with COVID pneumonia causing acute hypoxemic respiratory failure.   Developed pneumomediastinum 12/23 with concern for possible bilateral small pneumothoraces on 12/24.  PCCM consulted for evaluation of pneumothoraces  Past Medical History  GERD HTN Asthma  Significant Hospital Events   12/14 admit with hypoxemic respiratory failure in setting of COVID-19 pneumonia 12/23 noted to have pneumomediastinum on chest x-ray 12/24 PCCM consulted for evaluation 12/26 worsening hypoxemia, intubated  12/27 VV fem/fem ECMO cannulation 12/28 ETT switch  Consults:  PCCM  Procedures:    Significant Diagnostic Tests:  CT chest 12/22 >> extensive pneumomediastinum, multifocal patchy bilateral groundglass opacitiesCT chest 12/22 >> extensive pneumomediastinum, multifocal patchy bilateral groundglass opacities  Micro Data:  BCx2 12/14 >> negative  Sputum 12/15 >> negative   Antimicrobials:  Received remdesivir, steroids, actemra and convalescent plasma    Interim history/subjective:  No events.  Stable post trach. No issues per nursing. Flows/pressures stable.  Objective   Blood pressure 121/69, pulse 60, temperature (!) 97.1 F (36.2 C), temperature source Axillary, resp. rate (!) 9, height 5' 8"  (1.727 m), weight 122 kg, SpO2 100 %. CVP:  [8 mmHg-14 mmHg] 14 mmHg  Vent Mode: PCV FiO2 (%):  [40 %] 40 % Set Rate:  [20 bmp] 20 bmp PEEP:  [10 cmH20] 10 cmH20 Plateau Pressure:  [19 cmH20-22 cmH20] 22 cmH20   Intake/Output Summary (Last 24 hours) at 08/07/2019 0717 Last data filed at 08/07/2019 0600 Gross per 24 hour  Intake 4030.33 ml  Output 4205 ml  Net -174.67 ml   Filed Weights   08/05/19 0500 08/06/19 0500 08/07/19 0421  Weight: 128.2 kg 125.2 kg 122 kg     Examination:  GEN: ill appearing man on vent HEENT: HEENT CV: RRR, ext warm PULM: Severely diminished bilaterally GI: Soft, hypoactive BS EXT: Mild global anasarca, venous sheaths look okay, no clots on my circuit check, Pven -90s stable, flows 5LPM NEURO: Sedated/paralyzed PSYCH: BIS 40s-50s SKIN: Mild paleness  ABG mild alkalosis, sweep decreased CMP looks good LDH up slighlty from 300s to 400s CBGs on lower side HgB slightly down Plts stable in 60s CXR with bilateral lung whiteout slightly worse  Resolved Hospital Problem list     Assessment & Plan:  # Acute Hypoxemic Respiratory Failure secondary to COVID- finished remdesivir, got actemra, activated plasma, on steroids.  Inability to ventilate/oxygenate leading to VV ECMO cannulation 08/04/19. # Bilateral pneumothoraces and pneumomediastinum # Morbid obesity # Asthma # Stress hyperglycemia # Thrombocytopenia and low grade hemolysis stable  - Chest tube to water seal - Fine to tolerate severely reduced TV on vent, even MV of 0.  The only good evidence in this regard is that reducing the mechanical power applied to ARDS lungs is associated with improved mortality.  Whether to accomplish this with complete lung rest or attempting 4 mL/kg/min is still up for debate but most agree that limiting driving pressures to <15 cmH2O, Pplat <30cmH2O has the best outcomes - Continue sedation as ordered: fentanyl/versed titrated to BIS < 50, fixed dose ketaimin; ketamine increases BIS in bolus dose, its effect as a drip is more variable.  D/C paralytics today, try slow sedation wean - Finishing antibiotics today - Monitor Plts - Continue SSI - Decadron 38m/day x 1 week then 1 mg/day x  1 week then stop.  High risk for HAI and benefit of prolonged steroids in this population is unproven. - Diuretics to keep even - Body habitus and catheter position preclude proning - As lungs recover, PC should start to increase MV, may need  therapeutic bronchoscopy to help facilitate this in future - Guarded prognosis, COVID survival on ECMO is still pretty low  Best practice:   Feeding: Cortrak today, restart TF Analgesia: see above Sedation: see above Thromboprophylaxis: heparin gtt Ulcer prophylaxis: pantoprazole Glucose control: SSI Family: will reach out later today   Labs   CBC: Recent Labs  Lab 08/02/19 0655 08/03/19 0946 08/05/19 0416 08/05/19 1622 08/06/19 0457 08/06/19 1644 08/06/19 1954 08/07/19 0344 08/07/19 0429 08/07/19 0612  WBC 15.2*   < > 12.5* 13.4* 13.1* 10.4  --  10.0  --   --   NEUTROABS 11.7*  --   --   --   --   --   --   --   --   --   HGB 16.4  --  11.4* 11.2* 10.8*  10.5* 9.9* 9.5* 9.4* 9.5* 8.8*  HCT 50.4  --  35.3* 35.2* 34.3*  31.0* 31.7* 28.0* 30.7* 28.0* 26.0*  MCV 84.3   < > 86.3 87.8 88.4 88.8  --  90.8  --   --   PLT 185   < > 75* 70* 71* 65*  --  62*  --   --    < > = values in this interval not displayed.    Basic Metabolic Panel: Recent Labs  Lab 08/02/19 0655 08/03/19 1040 08/03/19 1542 08/03/19 2131 08/03/19 2222 08/04/19 1644 08/05/19 0416 08/05/19 1622 08/06/19 0457 08/06/19 1644 08/06/19 1954 08/07/19 0344 08/07/19 0429 08/07/19 0612  NA 137  --   --   --   --  136 139 140 143  145 143 144 143 144 144  K 4.0  --   --   --   --  4.7 3.9 4.5 4.2  4.2 4.7 4.5 4.4 4.3 4.1  CL 100  --    < >  --    < > 108 111 110 111 112*  --  113*  --   --   CO2 24  --    < >  --    < > 24 22 27 23 25   --  25  --   --   GLUCOSE 84  --    < >  --    < > 167* 136* 141* 97 117*  --  90  --   --   BUN 35*  --    < >  --    < > 39* 41* 36* 31* 28*  --  27*  --   --   CREATININE 0.91  --    < >  --    < > 0.74 0.68 0.69 0.61 0.69  --  0.57*  --   --   CALCIUM 8.6*  --    < >  --    < > 7.5* 7.8* 8.0* 8.2* 8.2*  --  8.1*  --   --   MG 2.1 2.4  --   --   --  2.8*  --   --   --   --   --   --   --   --   PHOS 4.3 7.1*  --  4.2  --  2.7  --   --   --   --   --   --   --   --     < > =  values in this interval not displayed.   GFR: Estimated Creatinine Clearance: 148.1 mL/min (A) (by C-G formula based on SCr of 0.57 mg/dL (L)). Recent Labs  Lab 08/04/19 0200 08/04/19 0507 08/05/19 0416 08/05/19 1104 08/05/19 1622 08/06/19 0457 08/06/19 0532 08/06/19 1644 08/07/19 0344  PROCALCITON  --   --   --  <0.10  --   --   --   --   --   WBC 16.8* 14.2* 12.5*  --  13.4* 13.1*  --  10.4 10.0  LATICACIDVEN 1.5 1.4 1.5  --   --   --  1.6  --   --     Liver Function Tests: Recent Labs  Lab 08/03/19 2108 08/04/19 0507 08/05/19 0416 08/06/19 0457 08/07/19 0344  AST 27 26 22 19 18   ALT 21 20 19 17 19   ALKPHOS 77 52 46 38 32*  BILITOT 0.8 1.3* 1.0 0.8 0.9  PROT 4.6* 4.6* 4.5* 4.8* 4.8*  ALBUMIN 2.4* 2.7* 2.7* 2.8* 3.1*   No results for input(s): LIPASE, AMYLASE in the last 168 hours. No results for input(s): AMMONIA in the last 168 hours.  ABG    Component Value Date/Time   PHART 7.485 (H) 08/07/2019 0612   PCO2ART 31.8 (L) 08/07/2019 0612   PO2ART 86.0 08/07/2019 0612   HCO3 24.0 08/07/2019 0612   TCO2 25 08/07/2019 0612   ACIDBASEDEF 1.0 08/06/2019 1256   O2SAT 97.0 08/07/2019 0612     Coagulation Profile: Recent Labs  Lab 08/03/19 2108 08/04/19 0507 08/05/19 0416 08/06/19 0457 08/07/19 0344  INR 2.0* 1.6* 1.4* 1.3* 1.4*    Cardiac Enzymes: No results for input(s): CKTOTAL, CKMB, CKMBINDEX, TROPONINI in the last 168 hours.  HbA1C: Hgb A1c MFr Bld  Date/Time Value Ref Range Status  07/24/2019 04:50 AM 6.7 (H) 4.8 - 5.6 % Final    Comment:    (NOTE) Pre diabetes:          5.7%-6.4% Diabetes:              >6.4% Glycemic control for   <7.0% adults with diabetes   09/30/2017 05:36 AM 5.7 (H) 4.8 - 5.6 % Final    Comment:    (NOTE)         Prediabetes: 5.7 - 6.4         Diabetes: >6.4         Glycemic control for adults with diabetes: <7.0     CBG: Recent Labs  Lab 08/06/19 1126 08/06/19 1635 08/06/19 1956 08/06/19 2351  08/07/19 0329  GLUCAP 104* 115* 108* 102* 96     Critical care time: 35 minutes not including separately billable procedures     Erskine Emery MD PCCM

## 2019-08-07 NOTE — Procedures (Signed)
Cortrak  Tube Type:  Cortrak - 43 inches Tube Location:  Left nare Initial Placement:  Postpyloric Secured by: Bridle Technique Used to Measure Tube Placement:  Documented cm marking at nare/ corner of mouth Cortrak Secured At:  95 cm    Cortrak Tube Team Note:  Consult received to place a Cortrak feeding tube.   X-ray is required, abdominal x-ray has been ordered by the Cortrak team. Please confirm tube placement before using the Cortrak tube.   If the tube becomes dislodged please keep the tube and contact the Cortrak team at [email or web address] (password [email or web address]) for replacement.  If after hours and replacement cannot be delayed, place a NG tube and confirm placement with an abdominal x-ray.    Koleen Distance MS, RD, LDN Pager #- [phone] Office#- [phone] After Hours Pager: [phone]

## 2019-08-07 NOTE — Progress Notes (Signed)
ANTICOAGULATION CONSULT NOTE  Pharmacy Consult for heparin Indication: ECMO  No Known Allergies  Patient Measurements: Height: 5' 8"  (172.7 cm) Weight: 268 lb 15.4 oz (122 kg) IBW/kg (Calculated) : 68.4 Heparin Dosing Weight: 100.7 kg  Vital Signs: Temp: 98.4 F (36.9 C) (12/30 1600) Temp Source: Oral (12/30 1600) BP: 117/61 (12/30 1600) Pulse Rate: 56 (12/30 1600)  Labs: Recent Labs    08/05/19 0416 08/05/19 0426 08/06/19 0457 08/06/19 1644 08/06/19 1954 08/07/19 0344 08/07/19 0929 08/07/19 1059 08/07/19 1100 08/07/19 1252 08/07/19 1600  HGB 11.4*  --  10.8*  10.5* 9.9*  --  9.4* 9.9* 9.5*  --  9.9*  --   HCT 35.3*  --  34.3*  31.0* 31.7*  --  30.7* 29.0* 28.0*  --  29.0*  --   PLT 75*   < > 71* 65*  --  62*  --   --   --   --   --   APTT 53*   < > 73*  --   --  77*  --   --   --   --  78*  LABPROT 16.9*  --  16.1*  --   --  16.6*  --   --   --   --   --   INR 1.4*  --  1.3*  --   --  1.4*  --   --   --   --   --   HEPARINUNFRC 0.49   < > 0.72*  --    < > 0.60  --   --  0.47  --  0.54  CREATININE 0.68   < > 0.61 0.69  --  0.57*  --   --   --   --  0.66   < > = values in this interval not displayed.    Estimated Creatinine Clearance: 148.1 mL/min (by C-G formula based on SCr of 0.66 mg/dL).   Medical History: Past Medical History:  Diagnosis Date  . Asthma   . GERD (gastroesophageal reflux disease)   . Headache   . History of kidney stones    LEFT URETERAL STONE  . HTN (hypertension)   . Pancreatitis 2018   GALLBALDDER SLUDGE CAUSED ISSUED RESOLVED    Medications:  Scheduled:  . vitamin C  500 mg Per Tube Daily  . chlorhexidine gluconate (MEDLINE KIT)  15 mL Mouth Rinse BID  . chlorhexidine gluconate (MEDLINE KIT)  15 mL Mouth Rinse BID  . Chlorhexidine Gluconate Cloth  6 each Topical Daily  . clonazePAM  2 mg Oral BID  . dexamethasone (DECADRON) injection  3 mg Intravenous Q24H   Followed by  . [START ON 08/13/2019] dexamethasone (DECADRON)  injection  1 mg Intravenous Q24H  . feeding supplement (PRO-STAT SUGAR FREE 64)  30 mL Per Tube BID  . free water  200 mL Per Tube Q8H  . furosemide  10 mg Intravenous BID  . gabapentin  300 mg Oral Q8H  . insulin aspart  1 Units Subcutaneous Q4H  . insulin aspart  3-9 Units Subcutaneous Q4H  . magnesium hydroxide  15 mL Oral Daily  . mouth rinse  15 mL Mouth Rinse BID  . mouth rinse  15 mL Mouth Rinse 10 times per day  . oxyCODONE  10 mg Oral Q6H  . pantoprazole (PROTONIX) IV  [name] mg Intravenous QHS  . QUEtiapine  25 mg Oral BID  . senna-docusate  1 tablet Per Tube BID  . sodium chloride  flush  10-[name] mL Intracatheter Q12H  . zinc sulfate  220 mg Per Tube Daily    Assessment: [name] known COVID+, has been declining with ARDS for past 48 hours requiring intubation and max vent settings. Underwent VV ECMO cannulation on 12/26 and now continues on IV heparin.  Heparin level is therapeutic at 0.54, aPTT 78 seconds, CBC stable this morning. No overt bleeding noted.     Goal of Therapy:  Heparin level 0.5-0.7 units/ml >>targeting at higher end of goal range aPTT 66-102 seconds  Monitor platelets by anticoagulation protocol: Yes   Plan:  -Continue heparin 1800 units/hr -Continue 5a/5p heparin level checks  Erin Hearing PharmD., BCPS Clinical Pharmacist 08/07/2019 5:14 PM

## 2019-08-08 ENCOUNTER — Inpatient Hospital Stay (HOSPITAL_COMMUNITY): Payer: Managed Care, Other (non HMO)

## 2019-08-08 LAB — POCT I-STAT 7, (LYTES, BLD GAS, ICA,H+H)
Acid-base deficit: 1 mmol/L (ref 0.0–2.0)
Acid-base deficit: 2 mmol/L (ref 0.0–2.0)
Acid-base deficit: 2 mmol/L (ref 0.0–2.0)
Acid-base deficit: 3 mmol/L — ABNORMAL HIGH (ref 0.0–2.0)
Acid-base deficit: 1 mmol/L (ref 0.0–2.0)
Bicarbonate: 23.5 mmol/L (ref 20.0–28.0)
Bicarbonate: 23.5 mmol/L (ref 20.0–28.0)
Bicarbonate: 23.9 mmol/L (ref 20.0–28.0)
Bicarbonate: 23.3 mmol/L (ref 20.0–28.0)
Bicarbonate: 24.9 mmol/L (ref 20.0–28.0)
Bicarbonate: 24.7 mmol/L (ref 20.0–28.0)
Calcium, Ion: 1.22 mmol/L (ref 1.15–1.40)
Calcium, Ion: 1.26 mmol/L (ref 1.15–1.40)
Calcium, Ion: 1.25 mmol/L (ref 1.15–1.40)
Calcium, Ion: 1.26 mmol/L (ref 1.15–1.40)
Calcium, Ion: 1.27 mmol/L (ref 1.15–1.40)
Calcium, Ion: 1.27 mmol/L (ref 1.15–1.40)
HCT: 22 % — ABNORMAL LOW (ref 39.0–52.0)
HCT: 21 % — ABNORMAL LOW (ref 39.0–52.0)
HCT: 26 % — ABNORMAL LOW (ref 39.0–52.0)
HCT: 24 % — ABNORMAL LOW (ref 39.0–52.0)
HCT: 24 % — ABNORMAL LOW (ref 39.0–52.0)
HCT: 19 % — ABNORMAL LOW (ref 39.0–52.0)
Hemoglobin: 7.5 g/dL — ABNORMAL LOW (ref 13.0–17.0)
Hemoglobin: 7.1 g/dL — ABNORMAL LOW (ref 13.0–17.0)
Hemoglobin: 8.8 g/dL — ABNORMAL LOW (ref 13.0–17.0)
Hemoglobin: 8.2 g/dL — ABNORMAL LOW (ref 13.0–17.0)
Hemoglobin: 8.2 g/dL — ABNORMAL LOW (ref 13.0–17.0)
Hemoglobin: 6.5 g/dL — CL (ref 13.0–17.0)
O2 Saturation: 95 %
O2 Saturation: 93 %
O2 Saturation: 87 %
O2 Saturation: 81 %
O2 Saturation: 90 %
O2 Saturation: 91 %
Patient temperature: 98.2
Patient temperature: 36.4
Patient temperature: 36.3
Patient temperature: 36.4
Patient temperature: 36.4
Patient temperature: 36.3
Potassium: 3.7 mmol/L (ref 3.5–5.1)
Potassium: 3.7 mmol/L (ref 3.5–5.1)
Potassium: 4.2 mmol/L (ref 3.5–5.1)
Potassium: 4.6 mmol/L (ref 3.5–5.1)
Potassium: 4.7 mmol/L (ref 3.5–5.1)
Potassium: 4.5 mmol/L (ref 3.5–5.1)
Sodium: 145 mmol/L (ref 135–145)
Sodium: 147 mmol/L — ABNORMAL HIGH (ref 135–145)
Sodium: 145 mmol/L (ref 135–145)
Sodium: 144 mmol/L (ref 135–145)
Sodium: 145 mmol/L (ref 135–145)
Sodium: 144 mmol/L (ref 135–145)
TCO2: 25 mmol/L (ref 22–32)
TCO2: 25 mmol/L (ref 22–32)
TCO2: 25 mmol/L (ref 22–32)
TCO2: 25 mmol/L (ref 22–32)
TCO2: 26 mmol/L (ref 22–32)
TCO2: 26 mmol/L (ref 22–32)
pCO2 arterial: 32.7 mmHg (ref 32.0–48.0)
pCO2 arterial: 37.1 mmHg (ref 32.0–48.0)
pCO2 arterial: 43.9 mmHg (ref 32.0–48.0)
pCO2 arterial: 42.9 mmHg (ref 32.0–48.0)
pCO2 arterial: 50.4 mmHg — ABNORMAL HIGH (ref 32.0–48.0)
pCO2 arterial: 42.7 mmHg (ref 32.0–48.0)
pH, Arterial: 7.464 — ABNORMAL HIGH (ref 7.350–7.450)
pH, Arterial: 7.407 (ref 7.350–7.450)
pH, Arterial: 7.341 — ABNORMAL LOW (ref 7.350–7.450)
pH, Arterial: 7.299 — ABNORMAL LOW (ref 7.350–7.450)
pH, Arterial: 7.34 — ABNORMAL LOW (ref 7.350–7.450)
pH, Arterial: 7.367 (ref 7.350–7.450)
pO2, Arterial: 72 mmHg — ABNORMAL LOW (ref 83.0–108.0)
pO2, Arterial: 63 mmHg — ABNORMAL LOW (ref 83.0–108.0)
pO2, Arterial: 54 mmHg — ABNORMAL LOW (ref 83.0–108.0)
pO2, Arterial: 50 mmHg — ABNORMAL LOW (ref 83.0–108.0)
pO2, Arterial: 60 mmHg — ABNORMAL LOW (ref 83.0–108.0)
pO2, Arterial: 61 mmHg — ABNORMAL LOW (ref 83.0–108.0)

## 2019-08-08 LAB — HEPATIC FUNCTION PANEL
ALT: 95 U/L — ABNORMAL HIGH (ref 0–44)
AST: 62 U/L — ABNORMAL HIGH (ref 15–41)
Albumin: 3.2 g/dL — ABNORMAL LOW (ref 3.5–5.0)
Alkaline Phosphatase: 45 U/L (ref 38–126)
Bilirubin, Direct: 0.2 mg/dL (ref 0.0–0.2)
Indirect Bilirubin: 0.7 mg/dL (ref 0.3–0.9)
Total Bilirubin: 0.9 mg/dL (ref 0.3–1.2)
Total Protein: 4.6 g/dL — ABNORMAL LOW (ref 6.5–8.1)

## 2019-08-08 LAB — BASIC METABOLIC PANEL
Anion gap: 8 (ref 5–15)
Anion gap: 5 (ref 5–15)
BUN: 33 mg/dL — ABNORMAL HIGH (ref 6–20)
BUN: 37 mg/dL — ABNORMAL HIGH (ref 6–20)
CO2: 24 mmol/L (ref 22–32)
CO2: 23 mmol/L (ref 22–32)
Calcium: 8.4 mg/dL — ABNORMAL LOW (ref 8.9–10.3)
Calcium: 8.1 mg/dL — ABNORMAL LOW (ref 8.9–10.3)
Chloride: 112 mmol/L — ABNORMAL HIGH (ref 98–111)
Chloride: 115 mmol/L — ABNORMAL HIGH (ref 98–111)
Creatinine, Ser: 0.64 mg/dL (ref 0.61–1.24)
Creatinine, Ser: 0.57 mg/dL — ABNORMAL LOW (ref 0.61–1.24)
GFR calc Af Amer: >60 mL/min (ref >60)
GFR calc Af Amer: >60 mL/min (ref >60)
GFR calc non Af Amer: >60 mL/min (ref >60)
GFR calc non Af Amer: >60 mL/min (ref >60)
Glucose, Bld: 113 mg/dL — ABNORMAL HIGH (ref 70–99)
Glucose, Bld: 184 mg/dL — ABNORMAL HIGH (ref 70–99)
Potassium: 3.8 mmol/L (ref 3.5–5.1)
Potassium: 4.7 mmol/L (ref 3.5–5.1)
Sodium: 144 mmol/L (ref 135–145)
Sodium: 143 mmol/L (ref 135–145)

## 2019-08-08 LAB — GLUCOSE, CAPILLARY
Glucose-Capillary: 120 mg/dL — ABNORMAL HIGH (ref 70–99)
Glucose-Capillary: 124 mg/dL — ABNORMAL HIGH (ref 70–99)
Glucose-Capillary: 136 mg/dL — ABNORMAL HIGH (ref 70–99)
Glucose-Capillary: 146 mg/dL — ABNORMAL HIGH (ref 70–99)
Glucose-Capillary: 163 mg/dL — ABNORMAL HIGH (ref 70–99)
Glucose-Capillary: 169 mg/dL — ABNORMAL HIGH (ref 70–99)

## 2019-08-08 LAB — COOXEMETRY PANEL
Carboxyhemoglobin: 2.2 % — ABNORMAL HIGH (ref 0.5–1.5)
Methemoglobin: 1.2 % (ref 0.0–1.5)
O2 Saturation: 87.9 %
Total hemoglobin: 8.1 g/dL — ABNORMAL LOW (ref 12.0–16.0)

## 2019-08-08 LAB — CULTURE, RESPIRATORY W GRAM STAIN

## 2019-08-08 LAB — CBC
HCT: 25.1 % — ABNORMAL LOW (ref 39.0–52.0)
HCT: 27.6 % — ABNORMAL LOW (ref 39.0–52.0)
Hemoglobin: 8.1 g/dL — ABNORMAL LOW (ref 13.0–17.0)
Hemoglobin: 8.8 g/dL — ABNORMAL LOW (ref 13.0–17.0)
MCH: 28.8 pg (ref 26.0–34.0)
MCH: 29.3 pg (ref 26.0–34.0)
MCHC: 32.3 g/dL (ref 30.0–36.0)
MCHC: 31.9 g/dL (ref 30.0–36.0)
MCV: 89.3 fL (ref 80.0–100.0)
MCV: 92 fL (ref 80.0–100.0)
Platelets: 55 10*3/uL — ABNORMAL LOW (ref 150–400)
Platelets: 51 10*3/uL — ABNORMAL LOW (ref 150–400)
RBC: 2.81 MIL/uL — ABNORMAL LOW (ref 4.22–5.81)
RBC: 3 MIL/uL — ABNORMAL LOW (ref 4.22–5.81)
RDW: 15.8 % — ABNORMAL HIGH (ref 11.5–15.5)
RDW: 16 % — ABNORMAL HIGH (ref 11.5–15.5)
WBC: 8.4 10*3/uL (ref 4.0–10.5)
WBC: 11.4 10*3/uL — ABNORMAL HIGH (ref 4.0–10.5)
nRBC: 0.6 % — ABNORMAL HIGH (ref 0.0–0.2)
nRBC: 0.6 % — ABNORMAL HIGH (ref 0.0–0.2)

## 2019-08-08 LAB — HEMOGLOBIN AND HEMATOCRIT, BLOOD
HCT: 29 % — ABNORMAL LOW (ref 39.0–52.0)
Hemoglobin: 9.3 g/dL — ABNORMAL LOW (ref 13.0–17.0)

## 2019-08-08 LAB — PREPARE RBC (CROSSMATCH)

## 2019-08-08 LAB — LACTATE DEHYDROGENASE: LDH: 394 U/L — ABNORMAL HIGH (ref 98–192)

## 2019-08-08 LAB — HEPARIN LEVEL (UNFRACTIONATED)
Heparin Unfractionated: 0.64 IU/mL (ref 0.30–0.70)
Heparin Unfractionated: 0.8 IU/mL — ABNORMAL HIGH (ref 0.30–0.70)

## 2019-08-08 LAB — PROTIME-INR
INR: 1.4 — ABNORMAL HIGH (ref 0.8–1.2)
Prothrombin Time: 17 seconds — ABNORMAL HIGH (ref 11.4–15.2)

## 2019-08-08 LAB — TRIGLYCERIDES: Triglycerides: 161 mg/dL — ABNORMAL HIGH (ref <150)

## 2019-08-08 LAB — LACTIC ACID, PLASMA: Lactic Acid, Venous: 1.5 mmol/L (ref 0.5–1.9)

## 2019-08-08 LAB — FIBRINOGEN
Fibrinogen: 124 mg/dL — ABNORMAL LOW (ref 210–475)
Fibrinogen: 155 mg/dL — ABNORMAL LOW (ref 210–475)

## 2019-08-08 LAB — PLATELET COUNT: Platelets: 49 10*3/uL — ABNORMAL LOW (ref 150–400)

## 2019-08-08 LAB — APTT
aPTT: 86 seconds — ABNORMAL HIGH (ref 24–36)
aPTT: 117 seconds — ABNORMAL HIGH (ref 24–36)

## 2019-08-08 MED ORDER — SODIUM CHLORIDE 0.9 % IV SOLN: 0.5000 ug/kg/min | INTRAVENOUS | Status: DC

## 2019-08-08 MED ORDER — SODIUM CHLORIDE 0.9% IV SOLUTION: Freq: Once | INTRAVENOUS | Status: AC

## 2019-08-08 MED ORDER — ARTIFICIAL TEARS OPHTHALMIC OINT
1.0000 "application " | TOPICAL_OINTMENT | Freq: Three times a day (TID) | OPHTHALMIC | Status: DC
  Administered 2019-08-08 – 2019-09-03 (×74): 1 via OPHTHALMIC
  Filled 2019-08-08 (×6): qty 3.5

## 2019-08-08 MED ORDER — SODIUM CHLORIDE 0.9 % IV SOLN
1.0000 mg/h | INTRAVENOUS | Status: DC
  Administered 2019-08-08: 20 mg/h via INTRAVENOUS
  Administered 2019-08-09: 14 mg/h via INTRAVENOUS
  Administered 2019-08-10 – 2019-08-11 (×2): 16 mg/h via INTRAVENOUS
  Administered 2019-08-11: 10 mg/h via INTRAVENOUS
  Administered 2019-08-12 – 2019-08-14 (×6): 25 mg/h via INTRAVENOUS
  Administered 2019-08-15 (×2): 24 mg/h via INTRAVENOUS
  Administered 2019-08-16: 25 mg/h via INTRAVENOUS
  Administered 2019-08-17: 40 mg/h via INTRAVENOUS
  Administered 2019-08-17: 24 mg/h via INTRAVENOUS
  Administered 2019-08-17 – 2019-08-21 (×11): 40 mg/h via INTRAVENOUS
  Administered 2019-08-21: 20 mg/h via INTRAVENOUS
  Administered 2019-08-22: 25 mg/h via INTRAVENOUS
  Administered 2019-08-23 (×2): 35 mg/h via INTRAVENOUS
  Filled 2019-08-08 (×9): qty 50
  Filled 2019-08-08: qty 40
  Filled 2019-08-08: qty 30
  Filled 2019-08-08 (×33): qty 50

## 2019-08-08 MED ORDER — QUETIAPINE FUMARATE 25 MG PO TABS
25.0000 mg | ORAL_TABLET | Freq: Two times a day (BID) | ORAL | Status: DC
  Administered 2019-08-08 – 2019-08-12 (×8): 25 mg
  Filled 2019-08-08 (×8): qty 1

## 2019-08-08 MED ORDER — LIDOCAINE-EPINEPHRINE 1 %-1:100000 IJ SOLN
10.0000 mL | Freq: Once | INTRAMUSCULAR | Status: DC
  Filled 2019-08-08 (×2): qty 10

## 2019-08-08 MED ORDER — SODIUM CHLORIDE 0.9 % IV SOLN
40.0000 ug | Freq: Once | INTRAVENOUS | Status: AC
  Administered 2019-08-08: 40 ug via INTRAVENOUS
  Filled 2019-08-08: qty 10

## 2019-08-08 MED ORDER — ALBUMIN HUMAN 5 % IV SOLN
INTRAVENOUS | Status: AC
  Administered 2019-08-08: 12.5 g via INTRAVENOUS
  Filled 2019-08-08: qty 500

## 2019-08-08 MED ORDER — FREE WATER
400.0000 mL | Freq: Three times a day (TID) | Status: DC
  Administered 2019-08-08 – 2019-08-13 (×14): 400 mL

## 2019-08-08 MED ORDER — MIDAZOLAM 50MG/50ML (1MG/ML) PREMIX INFUSION: 0.5000 mg/h | INTRAVENOUS | Status: DC

## 2019-08-08 MED ORDER — MIDAZOLAM BOLUS VIA INFUSION
2.0000 mg | INTRAVENOUS | Status: DC | PRN
  Administered 2019-08-08 – 2019-09-07 (×33): 2 mg via INTRAVENOUS
  Filled 2019-08-08: qty 2

## 2019-08-08 MED ORDER — ALBUMIN HUMAN 5 % IV SOLN
25.0000 g | Freq: Once | INTRAVENOUS | Status: AC
  Administered 2019-08-08: 12.5 g via INTRAVENOUS

## 2019-08-08 MED ORDER — GABAPENTIN 250 MG/5ML PO SOLN
300.0000 mg | Freq: Three times a day (TID) | ORAL | Status: DC
  Administered 2019-08-08 – 2019-08-23 (×44): 300 mg
  Filled 2019-08-08 (×54): qty 6

## 2019-08-08 MED ORDER — SODIUM CHLORIDE 0.9 % IV SOLN
0.0000 ug/kg/min | INTRAVENOUS | Status: DC
  Administered 2019-08-12 – 2019-08-15 (×9): 3 ug/kg/min via INTRAVENOUS
  Administered 2019-08-16: 4.5 ug/kg/min via INTRAVENOUS
  Administered 2019-08-16 (×2): 5 ug/kg/min via INTRAVENOUS
  Administered 2019-08-16: 4.5 ug/kg/min via INTRAVENOUS
  Administered 2019-08-17 (×2): 7 ug/kg/min via INTRAVENOUS
  Administered 2019-08-17: 5 ug/kg/min via INTRAVENOUS
  Administered 2019-08-17: 7 ug/kg/min via INTRAVENOUS
  Administered 2019-08-17: 5 ug/kg/min via INTRAVENOUS
  Administered 2019-08-18 (×5): 8 ug/kg/min via INTRAVENOUS
  Administered 2019-08-18 (×2): 10 ug/kg/min via INTRAVENOUS
  Administered 2019-08-19: 6 ug/kg/min via INTRAVENOUS
  Administered 2019-08-19 – 2019-08-20 (×4): 10 ug/kg/min via INTRAVENOUS
  Administered 2019-08-21: 5 ug/kg/min via INTRAVENOUS
  Administered 2019-08-21: 6.5 ug/kg/min via INTRAVENOUS
  Administered 2019-08-21: 5.5 ug/kg/min via INTRAVENOUS
  Administered 2019-08-21: 3 ug/kg/min via INTRAVENOUS
  Administered 2019-08-23: 15:00:00 4 ug/kg/min via INTRAVENOUS
  Administered 2019-08-23: 21:00:00 3.004 ug/kg/min via INTRAVENOUS
  Filled 2019-08-08 (×55): qty 20

## 2019-08-08 MED ORDER — LIDOCAINE-EPINEPHRINE 1 %-1:100000 IJ SOLN
20.0000 mL | Freq: Once | INTRAMUSCULAR | Status: AC
  Administered 2019-08-08: 5 mL via INTRADERMAL
  Filled 2019-08-08: qty 20

## 2019-08-08 MED ORDER — OXYCODONE HCL 5 MG PO TABS
10.0000 mg | ORAL_TABLET | Freq: Four times a day (QID) | ORAL | Status: DC
  Administered 2019-08-08 – 2019-08-12 (×16): 10 mg
  Filled 2019-08-08 (×16): qty 2

## 2019-08-08 MED ORDER — CLONAZEPAM 1 MG PO TABS
2.0000 mg | ORAL_TABLET | Freq: Two times a day (BID) | ORAL | Status: DC
  Administered 2019-08-08 – 2019-08-12 (×8): 2 mg
  Filled 2019-08-08 (×8): qty 2

## 2019-08-08 MED ORDER — ROCURONIUM BROMIDE 50 MG/5ML IV SOLN
80.0000 mg | Freq: Once | INTRAVENOUS | Status: AC
  Administered 2019-08-08: 80 mg via INTRAVENOUS
  Filled 2019-08-08: qty 8

## 2019-08-08 MED ORDER — "THROMBI-PAD 3""X3"" EX PADS"
1.0000 | MEDICATED_PAD | Freq: Once | CUTANEOUS | Status: AC
  Administered 2019-08-08: 1 via TOPICAL
  Filled 2019-08-08: qty 1

## 2019-08-08 MED ORDER — SUCCINYLCHOLINE CHLORIDE 20 MG/ML IJ SOLN
100.0000 mg | Freq: Once | INTRAMUSCULAR | Status: AC
  Administered 2019-08-08: 100 mg via INTRAVENOUS

## 2019-08-08 NOTE — Progress Notes (Signed)
NAME:  [name], MRN:  [id number], DOB:  [birth year], LOS: 17 ADMISSION DATE:  07/22/2019, CONSULTATION DATE:  12/24 REFERRING MD:  Sloan Leiter, CHIEF COMPLAINT:  Dyspnea   Brief History   [name] admitted 12/14 with COVID pneumonia causing acute hypoxemic respiratory failure.   Developed pneumomediastinum 12/23 with concern for possible bilateral small pneumothoraces on 12/24.  PCCM consulted for evaluation of pneumothoraces  Past Medical History  GERD HTN Asthma  Significant Hospital Events   12/14 admit with hypoxemic respiratory failure in setting of COVID-19 pneumonia 12/23 noted to have pneumomediastinum on chest x-ray 12/24 PCCM consulted for evaluation 12/26 worsening hypoxemia, intubated  12/27 VV fem/fem ECMO cannulation 12/28 ETT switch 12/29 Tracheostomy 12/31 Oozing from multiple sites, H/H drop  Consults:  PCCM  Procedures:    Significant Diagnostic Tests:  CT chest 12/22 >> extensive pneumomediastinum, multifocal patchy bilateral groundglass opacitiesCT chest 12/22 >> extensive pneumomediastinum, multifocal patchy bilateral groundglass opacities  Micro Data:  BCx2 12/14 >> negative  Sputum 12/15 >> negative   Antimicrobials:  Received remdesivir, steroids, actemra and convalescent plasma  5 days cefepime 12/24-12/29   Interim history/subjective:  Worsening bleeding from trach site, chest tubes, and central line. H/H dropped. Not on pressors. Continuing to require heavy sedation  Objective   Blood pressure (!) 101/38, pulse 78, temperature (!) 97.3 F (36.3 C), temperature source Core, resp. rate (!) 0, height 5' 8"  (1.727 m), weight 125.4 kg, SpO2 96 %. CVP:  [7 mmHg-19 mmHg] 14 mmHg  Vent Mode: PCV FiO2 (%):  [50 %] 50 % Set Rate:  [20 bmp] 20 bmp PEEP:  [10 cmH20] 10 cmH20 Plateau Pressure:  [20 cmH20-23 cmH20] 20 cmH20   Intake/Output Summary (Last 24 hours) at 08/08/2019 1345 Last data filed at 08/08/2019 1200 Gross per 24 hour  Intake  4536.12 ml  Output 3145 ml  Net 1391.12 ml   Filed Weights   08/06/19 0500 08/07/19 0421 08/08/19 0300  Weight: 125.2 kg 122 kg 125.4 kg    Examination:  GEN: ill appearing man on vent HEENT: trach in place, bloody secretions in mouth, trachostomy suctioning sites, oozing around suture sites and ostomy site, LIJ MML in place with oozing CV: RRR, ext warm PULM: Severely diminished bilaterally GI: Soft, hypoactive BS EXT: Mild global anasarca, venous sheaths look okay, no clots on my circuit check, Pven -70s, flow 4.5 LPM (decreased due to chugging) NEURO: Sedated PSYCH: RASS -5 SKIN: Mild paleness  Sugars okay BUN slightly up LDH a bit improved TG 161 Lactate 1.5 K low, Sodium/chloride high Hgb down a bit Plts 55 from 60s Fibrinogen 124 from 145 INR 1.4 stable CXR stable  Resolved Hospital Problem list     Assessment & Plan:  # Acute Hypoxemic Respiratory Failure secondary to COVID- finished remdesivir, got actemra, activated plasma, on steroids.  Inability to ventilate/oxygenate leading to VV ECMO cannulation 08/04/19. # Bilateral pneumothoraces and pneumomediastinum # Morbid obesity # Asthma # Stress hyperglycemia # Coagulopathy and ABLA a bit worse today  - Chest tube to water seal - Fine to tolerate severely reduced TV on vent, even MV of 0.  The only good evidence in this regard is that reducing the mechanical power applied to ARDS lungs is associated with improved mortality.  Whether to accomplish this with complete lung rest or attempting 4 mL/kg/min is still up for debate but most agree that limiting driving pressures to <15 cmH2O, Pplat <30cmH2O has the best outcomes - Continue sedation as ordered: dilaudid/versed/propofol,  keep an eye on TG, had to re-paralyze and adjust vent triggers, was autocycling suspicion related to blood in tracheostomy tube - Finishing antibiotics today - Monitor Plts - Cryo, DDAVP try to stem bleeding, may need platelets, trend H/H,  goal HgB > 42m/dL - Continue SSI - Decadron 354mday x 1 week then 1 mg/day x 1 week then stop.  High risk for HAI and benefit of prolonged steroids in this population is unproven. - Body habitus and catheter position preclude proning - As lungs recover, PC should start to increase MV, may need therapeutic bronchoscopy to help facilitate this in future - Guarded prognosis, COVID survival on ECMO is still pretty low  Best practice:   Feeding: TF, FWF Analgesia: see above Sedation: see above Thromboprophylaxis: heparin gtt Ulcer prophylaxis: pantoprazole Glucose control: SSI Family: will reach out later today   Labs   CBC: Recent Labs  Lab 08/02/19 0655 08/03/19 0946 08/06/19 0457 08/06/19 1644 08/07/19 0344 08/07/19 1819 08/07/19 2038 08/07/19 2157 08/08/19 0302 08/08/19 0314  WBC 15.2*   < > 13.1* 10.4 10.0  --   --  7.2  --  8.4  NEUTROABS 11.7*  --   --   --   --   --   --  4.7  --   --   HGB 16.4  --  10.8*  10.5* 9.9* 9.4* 8.8* 7.8* 8.1* 7.5* 8.1*  HCT 50.4  --  34.3*  31.0* 31.7* 30.7* 26.0* 23.0* 25.8* 22.0* 25.1*  MCV 84.3   < > 88.4 88.8 90.8  --   --  90.5  --  89.3  PLT 185   < > 71* 65* 62*  --   --  55*  --  55*   < > = values in this interval not displayed.    Basic Metabolic Panel: Recent Labs  Lab 08/02/19 0655 08/03/19 1040 08/03/19 1542 08/03/19 2131 08/03/19 2222 08/04/19 1644 08/04/19 1651 08/06/19 0457 08/06/19 1644 08/07/19 0344 08/07/19 1600 08/07/19 1819 08/07/19 2038 08/08/19 0302 08/08/19 0314  NA 137  --   --   --   --  136  --  143  145 143 143 144 144 144 145 144  K 4.0  --   --   --   --  4.7  --  4.2  4.2 4.7 4.4 4.1 4.0 3.9 3.7 3.8  CL 100  --    < >  --    < > 108   < > 111 112* 113* 114*  --   --   --  112*  CO2 24  --    < >  --    < > 24   < > 23 25 25 23   --   --   --  24  GLUCOSE 84  --    < >  --    < > 167*   < > 97 117* 90 117*  --   --   --  113*  BUN 35*  --    < >  --    < > 39*   < > 31* 28* 27* 25*  --    --   --  33*  CREATININE 0.91  --    < >  --    < > 0.74   < > 0.61 0.69 0.57* 0.66  --   --   --  0.64  CALCIUM 8.6*  --    < >  --    < >  7.5*   < > 8.2* 8.2* 8.1* 8.5*  --   --   --  8.4*  MG 2.1 2.4  --   --   --  2.8*  --   --   --   --   --   --   --   --   --   PHOS 4.3 7.1*  --  4.2  --  2.7  --   --   --   --   --   --   --   --   --    < > = values in this interval not displayed.   GFR: Estimated Creatinine Clearance: 150.4 mL/min (by C-G formula based on SCr of 0.64 mg/dL). Recent Labs  Lab 08/05/19 0416 08/05/19 1104 08/06/19 0532 08/06/19 1644 08/07/19 0344 08/07/19 0831 08/07/19 2157 08/08/19 0313 08/08/19 0314  PROCALCITON  --  <0.10  --   --   --   --   --   --   --   WBC 12.5*  --   --  10.4 10.0  --  7.2  --  8.4  LATICACIDVEN 1.5  --  1.6  --   --  1.0  --  1.5  --     Liver Function Tests: Recent Labs  Lab 08/04/19 0507 08/05/19 0416 08/06/19 0457 08/07/19 0344 08/08/19 0314  AST 26 22 19 18  62*  ALT 20 19 17 19  95*  ALKPHOS 52 46 38 32* 45  BILITOT 1.3* 1.0 0.8 0.9 0.9  PROT 4.6* 4.5* 4.8* 4.8* 4.6*  ALBUMIN 2.7* 2.7* 2.8* 3.1* 3.2*   No results for input(s): LIPASE, AMYLASE in the last 168 hours. No results for input(s): AMMONIA in the last 168 hours.  ABG    Component Value Date/Time   PHART 7.464 (H) 08/08/2019 0302   PCO2ART 32.7 08/08/2019 0302   PO2ART 72.0 (L) 08/08/2019 0302   HCO3 23.5 08/08/2019 0302   TCO2 25 08/08/2019 0302   ACIDBASEDEF 1.0 08/07/2019 0929   O2SAT 95.0 08/08/2019 0302     Coagulation Profile: Recent Labs  Lab 08/04/19 0507 08/05/19 0416 08/06/19 0457 08/07/19 0344 08/08/19 0314  INR 1.6* 1.4* 1.3* 1.4* 1.4*    Cardiac Enzymes: No results for input(s): CKTOTAL, CKMB, CKMBINDEX, TROPONINI in the last 168 hours.  HbA1C: Hgb A1c MFr Bld  Date/Time Value Ref Range Status  07/24/2019 04:50 AM 6.7 (H) 4.8 - 5.6 % Final    Comment:    (NOTE) Pre diabetes:          5.7%-6.4% Diabetes:               >6.4% Glycemic control for   <7.0% adults with diabetes   09/30/2017 05:36 AM 5.7 (H) 4.8 - 5.6 % Final    Comment:    (NOTE)         Prediabetes: 5.7 - 6.4         Diabetes: >6.4         Glycemic control for adults with diabetes: <7.0     CBG: Recent Labs  Lab 08/07/19 1550 08/07/19 2038 08/08/19 0005 08/08/19 0754 08/08/19 1106  GLUCAP 123* 136* 120* 124* 136*     Critical care time: 32 minutes not including separately billable procedures     Erskine Emery MD PCCM

## 2019-08-08 NOTE — Progress Notes (Addendum)
ANTICOAGULATION CONSULT NOTE  Pharmacy Consult for heparin Indication: ECMO  No Known Allergies  Patient Measurements: Height: 5' 8"  (172.7 cm) Weight: 276 lb 7.3 oz (125.4 kg) IBW/kg (Calculated) : 68.4 Heparin Dosing Weight: 100.7 kg  Vital Signs: Temp: 97.3 F (36.3 C) (12/31 1604) Temp Source: Core (12/31 1604) BP: 105/51 (12/31 1601) Pulse Rate: 81 (12/31 1645)  Labs: Recent Labs    08/06/19 0457 08/06/19 1005 08/07/19 0344 08/07/19 0429 08/07/19 1600 08/07/19 2157 08/08/19 0300 08/08/19 0314 08/08/19 1341 08/08/19 1630  HGB 10.8*  10.5*  --  9.4*  --   --  8.1*  --  8.1* 9.3* 8.8*  HCT 34.3*  31.0*  --  30.7*  --   --  25.8*  --  25.1* 29.0* 27.6*  PLT 71*   < > 62*  --   --  55*  --  55* 49* 51*  APTT 73*  --  77*  --  78* 90*  --  86*  --   --   LABPROT 16.1*  --  16.6*  --   --   --   --  17.0*  --   --   INR 1.3*  --  1.4*  --   --   --   --  1.4*  --   --   HEPARINUNFRC 0.72*   < > 0.60   < > 0.54  --  0.64  --   --  0.80*  CREATININE 0.61   < > 0.57*  --  0.66  --   --  0.64  --  0.57*   < > = values in this interval not displayed.    Estimated Creatinine Clearance: 150.4 mL/min (A) (by C-G formula based on SCr of 0.57 mg/dL (L)).   Medical History: Past Medical History:  Diagnosis Date  . Asthma   . GERD (gastroesophageal reflux disease)   . Headache   . History of kidney stones    LEFT URETERAL STONE  . HTN (hypertension)   . Pancreatitis 2018   GALLBALDDER SLUDGE CAUSED ISSUED RESOLVED    Medications:  Scheduled:  . artificial tears  1 application Both Eyes H6D  . vitamin C  500 mg Per Tube Daily  . chlorhexidine gluconate (MEDLINE KIT)  15 mL Mouth Rinse BID  . chlorhexidine gluconate (MEDLINE KIT)  15 mL Mouth Rinse BID  . Chlorhexidine Gluconate Cloth  6 each Topical Daily  . clonazePAM  2 mg Per Tube BID  . dexamethasone (DECADRON) injection  3 mg Intravenous Q24H   Followed by  . [START ON 08/13/2019] dexamethasone (DECADRON)  injection  1 mg Intravenous Q24H  . feeding supplement (PRO-STAT SUGAR FREE 64)  30 mL Per Tube BID  . free water  400 mL Per Tube Q8H  . gabapentin  300 mg Per Tube Q8H  . insulin aspart  1 Units Subcutaneous Q4H  . insulin aspart  3-9 Units Subcutaneous Q4H  . magnesium hydroxide  15 mL Oral Daily  . mouth rinse  15 mL Mouth Rinse BID  . mouth rinse  15 mL Mouth Rinse 10 times per day  . oxyCODONE  10 mg Per Tube Q6H  . pantoprazole (PROTONIX) IV  40 mg Intravenous QHS  . QUEtiapine  25 mg Per Tube BID  . senna-docusate  1 tablet Per Tube BID  . sodium chloride flush  10-40 mL Intracatheter Q12H  . zinc sulfate  220 mg Per Tube Daily    Assessment: 43  yom known COVID+, has been declining with ARDS for past 48 hours requiring intubation and max vent settings. Underwent VV ECMO cannulation on 12/26 and now continues on IV heparin.  Heparin level is just slightly above therapeutic range at 0.8.  Significant oozing noted from trach site, central line, and chest tubes.    Goal of Therapy:  Heparin level 0.5-0.7 units/ml >>targeting at higher end of goal range; although now bleeding so dose reduced aPTT 66-102 seconds  Monitor platelets by anticoagulation protocol: Yes   Plan:  Holding heparin x 1 hr, then decreasing to 1600 units/hr per MD orders. Recheck heparin level and CBC in 6 hrs. Will continue to check heparin levels at 5AM/5 PM. F/u further plans for heparin.  Pharmacy will not adjust without discussing with the team.  [name], [facility] Clinical Pharmacist Phone [phone]  08/08/2019 5:19 PM

## 2019-08-08 NOTE — Progress Notes (Signed)
ECMO circuit alarming increased venous pressure. ECMO specialist paged MD. Albumin 250 x2 ordered per Dr. Erskine Emery.    RN and ECMO specialist will continue to monitor patient closely.

## 2019-08-08 NOTE — Progress Notes (Addendum)
ANTICOAGULATION CONSULT NOTE  Pharmacy Consult for heparin Indication: ECMO  No Known Allergies  Patient Measurements: Height: 5' 8"  (172.7 cm) Weight: 276 lb 7.3 oz (125.4 kg) IBW/kg (Calculated) : 68.4 Heparin Dosing Weight: 100.7 kg  Vital Signs: Temp: 97.7 F (36.5 C) (12/31 0847) Temp Source: Core (12/31 0847) BP: 141/60 (12/31 0806) Pulse Rate: 64 (12/31 0900)  Labs: Recent Labs    08/06/19 0457 08/06/19 1005 08/07/19 0344 08/07/19 1100 08/07/19 1600 08/07/19 2157 08/08/19 0300 08/08/19 0302 08/08/19 0314  HGB 10.8*  10.5*  --  9.4*  --   --  8.1*  --  7.5* 8.1*  HCT 34.3*  31.0*  --  30.7*  --   --  25.8*  --  22.0* 25.1*  PLT 71*   < > 62*  --   --  55*  --   --  55*  APTT 73*  --  77*  --  78* 90*  --   --  86*  LABPROT 16.1*  --  16.6*  --   --   --   --   --  17.0*  INR 1.3*  --  1.4*  --   --   --   --   --  1.4*  HEPARINUNFRC 0.72*   < > 0.60 0.47 0.54  --  0.64  --   --   CREATININE 0.61   < > 0.57*  --  0.66  --   --   --  0.64   < > = values in this interval not displayed.    Estimated Creatinine Clearance: 150.4 mL/min (by C-G formula based on SCr of 0.64 mg/dL).   Medical History: Past Medical History:  Diagnosis Date  . Asthma   . GERD (gastroesophageal reflux disease)   . Headache   . History of kidney stones    LEFT URETERAL STONE  . HTN (hypertension)   . Pancreatitis 2018   GALLBALDDER SLUDGE CAUSED ISSUED RESOLVED    Medications:  Scheduled:  . sodium chloride   Intravenous Once  . vitamin C  500 mg Per Tube Daily  . chlorhexidine gluconate (MEDLINE KIT)  15 mL Mouth Rinse BID  . chlorhexidine gluconate (MEDLINE KIT)  15 mL Mouth Rinse BID  . Chlorhexidine Gluconate Cloth  6 each Topical Daily  . clonazePAM  2 mg Oral BID  . dexamethasone (DECADRON) injection  3 mg Intravenous Q24H   Followed by  . [START ON 08/13/2019] dexamethasone (DECADRON) injection  1 mg Intravenous Q24H  . feeding supplement (PRO-STAT SUGAR FREE 64)   30 mL Per Tube BID  . free water  200 mL Per Tube Q8H  . gabapentin  300 mg Oral Q8H  . insulin aspart  1 Units Subcutaneous Q4H  . insulin aspart  3-9 Units Subcutaneous Q4H  . magnesium hydroxide  15 mL Oral Daily  . mouth rinse  15 mL Mouth Rinse BID  . mouth rinse  15 mL Mouth Rinse 10 times per day  . oxyCODONE  10 mg Oral Q6H  . pantoprazole (PROTONIX) IV  40 mg Intravenous QHS  . QUEtiapine  25 mg Oral BID  . senna-docusate  1 tablet Per Tube BID  . sodium chloride flush  10-40 mL Intracatheter Q12H  . zinc sulfate  220 mg Per Tube Daily    Assessment: 15 yom known COVID+, has been declining with ARDS for past 48 hours requiring intubation and max vent settings. Underwent VV ECMO cannulation on 12/26 and  now continues on IV heparin.  Heparin level is therapeutic at 0.64, aPTT 86 seconds, hgb 8.1 this morning. Oozing noted from trach site, central line, and chest tubes.    Goal of Therapy:  Heparin level 0.5-0.7 units/ml >>targeting at higher end of goal range aPTT 66-102 seconds  Monitor platelets by anticoagulation protocol: Yes   Plan:  -Continue heparin 1800 units/hr -Continue 5a/5p heparin level checks -Monitor bleeding  Vertis Kelch, PharmD, [facility] PGY2 Cardiology Pharmacy Resident Phone [phone] 08/08/2019       10:03 AM  Please check [email or web address] for unit-specific pharmacist phone numbers

## 2019-08-08 NOTE — Progress Notes (Signed)
Patient ID: [name], male   DOB: Aug 31, 1975, 43 y.o.   MRN: [id number]    Advanced Heart Failure Rounding Note   Subjective:    Stable on VV ecmo with good flows and oxygen saturation 90s. No pressors, stable SBP. Lactate normal. On Dilaudid, Propofol, Versed for sedation. S/p tracheostomy with bleeding from trach site.   CXR: Bilateral lung opacities c/w ARDS, slight progression  I/Os even with Lasix 10 mg IV x 1 yesterday. No Lasix today, CVP rising.   Platelets 76=>71=>62=>51, LDH 562=>538=>444=>394.    Afebrile, off abx.   On vent 50% PEEP 10. Bilateral CTs in for PTXs  ECMO parameters Flow 4.5 RPM 3200 dP 13 Pressure -80   Objective:   Weight Range:  Vital Signs:   Temp:  [97.3 F (36.3 C)-98.4 F (36.9 C)] 97.3 F (36.3 C) (12/31 1604) Pulse Rate:  [57-81] 81 (12/31 1645) Resp:  [0-28] 9 (12/31 1645) BP: (82-162)/(38-65) 105/51 (12/31 1601) SpO2:  [91 %-100 %] 92 % (12/31 1645) Arterial Line BP: (126-172)/(37-60) 164/57 (12/31 1645) FiO2 (%):  [50 %] 50 % (12/31 1604) Weight:  [125.4 kg] 125.4 kg (12/31 0300) Last BM Date: 08/01/19  Weight change: Filed Weights   08/06/19 0500 08/07/19 0421 08/08/19 0300  Weight: 125.2 kg 122 kg 125.4 kg    Intake/Output:   Intake/Output Summary (Last 24 hours) at 08/08/2019 1707 Last data filed at 08/08/2019 1601 Gross per 24 hour  Intake 3874.56 ml  Output 2480 ml  Net 1394.56 ml     Physical Exam: General: NAD Neck: Thick JVP difficult, no thyromegaly or thyroid nodule.  Lungs: Clear to auscultation bilaterally with normal respiratory effort. CV: Nondisplaced PMI.  Heart regular S1/S2, no S3/S4, no murmur.  1+ edema to knees.    Abdomen: Soft, nontender, no hepatosplenomegaly, no distention.  Skin: Intact without lesions or rashes.  Neurologic: Alert and oriented x 3.  Psych: Normal affect. Extremities: No clubbing or cyanosis.  HEENT: Normal.   Telemetry: Sinus 70-80s Personally  reviewed  Labs: Basic Metabolic Panel: Recent Labs  Lab 08/02/19 0655 08/03/19 1040 08/03/19 1542 08/03/19 2131 08/03/19 2222 08/04/19 1644 08/04/19 1651 08/06/19 1644 08/07/19 0344 08/07/19 1600 08/07/19 1819 08/07/19 2038 08/08/19 0302 08/08/19 0314 08/08/19 1630  NA 137  --   --   --   --  136  --  143 143 144 144 144 145 144 143  K 4.0  --   --   --   --  4.7  --  4.7 4.4 4.1 4.0 3.9 3.7 3.8 4.7  CL 100  --    < >  --    < > 108   < > 112* 113* 114*  --   --   --  112* 115*  CO2 24  --    < >  --    < > 24   < > _0 --   --   --  24 23  GLUCOSE 84  --    < >  --    < > 167*   < > 117* 90 117*  --   --   --  113* 184*  BUN 35*  --    < >  --    < > 39*   < > 28* 27* 25*  --   --   --  33* 37*  CREATININE 0.91  --    < >  --    < >  0.74   < > 0.69 0.57* 0.66  --   --   --  0.64 0.57*  CALCIUM 8.6*  --    < >  --    < > 7.5*   < > 8.2* 8.1* 8.5*  --   --   --  8.4* 8.1*  MG 2.1 2.4  --   --   --  2.8*  --   --   --   --   --   --   --   --   --   PHOS 4.3 7.1*  --  4.2  --  2.7  --   --   --   --   --   --   --   --   --    < > = values in this interval not displayed.    Liver Function Tests: Recent Labs  Lab 08/04/19 0507 08/05/19 0416 08/06/19 0457 08/07/19 0344 08/08/19 0314  AST _0 62*  ALT _1 95*  ALKPHOS 52 46 38 32* 45  BILITOT 1.3* 1.0 0.8 0.9 0.9  PROT 4.6* 4.5* 4.8* 4.8* 4.6*  ALBUMIN 2.7* 2.7* 2.8* 3.1* 3.2*   No results for input(s): LIPASE, AMYLASE in the last 168 hours. No results for input(s): AMMONIA in the last 168 hours.  CBC: Recent Labs  Lab 08/02/19 0655 08/03/19 0946 08/06/19 1644 08/07/19 0344 08/07/19 2157 08/08/19 0302 08/08/19 0314 08/08/19 1341 08/08/19 1630  WBC 15.2*   < > 10.4 10.0 7.2  --  8.4  --  11.4*  NEUTROABS 11.7*  --   --   --  4.7  --   --   --   --   HGB 16.4  --  9.9* 9.4* 8.1* 7.5* 8.1* 9.3* 8.8*  HCT 50.4  --  31.7* 30.7* 25.8* 22.0* 25.1* 29.0* 27.6*  MCV 84.3   < > 88.8 90.8 90.5   --  89.3  --  92.0  PLT 185   < > 65* 62* 55*  --  55* 49* 51*   < > = values in this interval not displayed.    Cardiac Enzymes: No results for input(s): CKTOTAL, CKMB, CKMBINDEX, TROPONINI in the last 168 hours.  BNP: BNP (last 3 results) Recent Labs    07/22/19 1039  BNP 15.3    ProBNP (last 3 results) No results for input(s): PROBNP in the last 8760 hours.    Other results:  Imaging: DG Chest 1 View  Result Date: 08/07/2019 CLINICAL DATA:  Chest tube in place. Tracheostomy. EXAM: CHEST  1 VIEW COMPARISON:  Radiograph yesterday. FINDINGS: Tracheostomy tube tip at the thoracic inlet. Bilateral chest tubes in place. Left internal jugular central line tip in the upper SVC. ECMO catheter is noted in the IVC and atrial caval junction. No evidence of pneumothorax. Cardiomegaly. Low lung volumes. Diffuse bilateral airspace disease with slight worsening in the interim. IMPRESSION: 1. Tracheostomy tube tip at the thoracic inlet. 2. Bilateral chest tubes in place. No pneumothorax. 3. Extensive bilateral lung opacification, slight progression from yesterday. Electronically Signed   By: Keith Rake M.D.   On: 08/07/2019 06:55   DG CHEST PORT 1 VIEW  Result Date: 08/08/2019 CLINICAL DATA:  ET tubes present, patient on ECMO with history of COVID EXAM: PORTABLE CHEST 1 VIEW COMPARISON:  08/07/2019 FINDINGS: Tracheostomy tube, left-sided central venous access device and bilateral chest tubes remain in place without change. Feeding tube courses through and off  of the field of the radiograph. Cardiomediastinal contours largely obscured due to diffuse airspace disease in the chest. Dense opacification at the lung bases such that the left hemidiaphragm is completely obscured with partial obscure a shin of right hemidiaphragm. No visible pneumothorax. Visualized skeletal structures are unremarkable. IMPRESSION: 1. No significant change in the appearance of the chest since yesterday's study. 2.  Stable support apparatus. 3. No visible pneumothorax. Electronically Signed   By: Zetta Bills M.D.   On: 08/08/2019 09:34   DG Abd Portable 1V  Result Date: 08/07/2019 CLINICAL DATA:  Feeding tube placement EXAM: PORTABLE ABDOMEN - 1 VIEW COMPARISON:  08/03/2019 FINDINGS: Feeding tube is in place with the tip in the distal duodenum near the ligament of Treitz. Nonobstructive bowel gas pattern. IMPRESSION: Feeding tube tip in the distal duodenum. Electronically Signed   By: Rolm Baptise M.D.   On: 08/07/2019 12:12     Medications:     Scheduled Medications: . artificial tears  1 application Both Eyes X5M  . vitamin C  500 mg Per Tube Daily  . chlorhexidine gluconate (MEDLINE KIT)  15 mL Mouth Rinse BID  . chlorhexidine gluconate (MEDLINE KIT)  15 mL Mouth Rinse BID  . Chlorhexidine Gluconate Cloth  6 each Topical Daily  . clonazePAM  2 mg Per Tube BID  . dexamethasone (DECADRON) injection  3 mg Intravenous Q24H   Followed by  . [START ON 08/13/2019] dexamethasone (DECADRON) injection  1 mg Intravenous Q24H  . feeding supplement (PRO-STAT SUGAR FREE 64)  30 mL Per Tube BID  . free water  400 mL Per Tube Q8H  . gabapentin  300 mg Per Tube Q8H  . insulin aspart  1 Units Subcutaneous Q4H  . insulin aspart  3-9 Units Subcutaneous Q4H  . magnesium hydroxide  15 mL Oral Daily  . mouth rinse  15 mL Mouth Rinse BID  . mouth rinse  15 mL Mouth Rinse 10 times per day  . oxyCODONE  10 mg Per Tube Q6H  . pantoprazole (PROTONIX) IV  40 mg Intravenous QHS  . QUEtiapine  25 mg Per Tube BID  . senna-docusate  1 tablet Per Tube BID  . sodium chloride flush  10-40 mL Intracatheter Q12H  . zinc sulfate  220 mg Per Tube Daily    Infusions: . sodium chloride    . sodium chloride 10 mL/hr at 08/08/19 1600  . albumin human    . cisatracurium (NIMBEX) infusion    . dextrose    . feeding supplement (PIVOT 1.5 CAL) 1,000 mL (08/08/19 1047)  . heparin 1,800 Units/hr (08/08/19 1600)  . HYDROmorphone  4 mg/hr (08/08/19 1600)  . midazolam 25 mg/hr (08/08/19 1600)  . propofol (DIPRIVAN) infusion 40 mcg/kg/min (08/08/19 1600)    PRN Medications: Place/Maintain arterial line **AND** sodium chloride, sodium chloride, acetaminophen, dextrose, hydrALAZINE, HYDROmorphone, metoprolol tartrate, midazolam, ondansetron (ZOFRAN) IV, sodium chloride, sodium chloride flush   Assessment/Plan:    1. Acute hypoxic/hypercarbic respiratory failure due to COVID PNA: Remained markedly hypoxic despite full support. Intubated 12/26. S/p bilateral CTs 12/26 for pneumothoraces.  TEE on 12/26 LVEF 70% RV ok. VV ECMO begun 12/26. Day #6 VV ECMO. Tracheostomy 12/29.  COVID ARDS at this point.  Flows stable. Oxygen saturation stable, good color change between cannulas, suspect level of mixing acceptable.  Low level hemolysis, plts lower but LDH also lower today. Good I/Os with Lasix IV yesterday, but has aggressive response and requires albumin.  - CVP rising, may need another  dose of Lasix but will hold off for now with bleeding at trach site and venous negative alarms.  - Hold heparin gtt for an hour with trach site bleeding and will decrease rate by 200 U/hr.  2. Bilateral PTX: Due to barotrauma. CTs in place 3. COVID PNA: management of COVID infection per CCM. CXR with bilateral multifocal PNA progressing to likely ARDS.  - s/p 10 days of remdesivir - 12/16, 12/2 s/p tociluzimab - 12/17 s/p convalescent plasma - On decadron - Off abx.   4. ID: Empiric coverage with vancomycin/cefepime for possible secondary bacterial PNA, course completed.   5. Thrombocytopenia: Slow decrease in platelets, likely low level hemolysis + critical illness.    Length of Stay: 17   Loralie Champagne MD 08/08/2019, 5:07 PM  Advanced Heart Failure Team Pager [phone] (M-F; [place])  Please contact [facility] for night-coverage after hours (4p -7a ) and weekends on [email or web address]

## 2019-08-08 NOTE — Progress Notes (Signed)
PT Cancellation Note  Patient Details Name: [name] MRN: [id number] DOB: 27-Apr-1976   Cancelled Treatment:    Reason Eval/Treat Not Completed: Medical issues which prohibited therapy. Pt remains intubated, sedated, and on paralytics. Pt is unable to follow commands and participate in skilled PT intervention at this time. PT will follow up when pt is more medically appropriate to participate in skilled PT intervention.   Zenaida Niece 08/08/2019, 2:38 PM

## 2019-08-09 ENCOUNTER — Encounter (HOSPITAL_COMMUNITY): Payer: Self-pay | Admitting: Anesthesiology

## 2019-08-09 ENCOUNTER — Inpatient Hospital Stay (HOSPITAL_COMMUNITY): Payer: Managed Care, Other (non HMO)

## 2019-08-09 DIAGNOSIS — Z515 Encounter for palliative care: Secondary | ICD-10-CM

## 2019-08-09 DIAGNOSIS — Z7189 Other specified counseling: Secondary | ICD-10-CM

## 2019-08-09 LAB — CBC
HCT: 25.3 % — ABNORMAL LOW (ref 39.0–52.0)
HCT: 28 % — ABNORMAL LOW (ref 39.0–52.0)
HCT: 27.3 % — ABNORMAL LOW (ref 39.0–52.0)
Hemoglobin: 8.2 g/dL — ABNORMAL LOW (ref 13.0–17.0)
Hemoglobin: 9.2 g/dL — ABNORMAL LOW (ref 13.0–17.0)
Hemoglobin: 8.8 g/dL — ABNORMAL LOW (ref 13.0–17.0)
MCH: 29.2 pg (ref 26.0–34.0)
MCH: 29.6 pg (ref 26.0–34.0)
MCH: 29.6 pg (ref 26.0–34.0)
MCHC: 32.4 g/dL (ref 30.0–36.0)
MCHC: 32.9 g/dL (ref 30.0–36.0)
MCHC: 32.2 g/dL (ref 30.0–36.0)
MCV: 90 fL (ref 80.0–100.0)
MCV: 90 fL (ref 80.0–100.0)
MCV: 91.9 fL (ref 80.0–100.0)
Platelets: 48 10*3/uL — ABNORMAL LOW (ref 150–400)
Platelets: 49 10*3/uL — ABNORMAL LOW (ref 150–400)
Platelets: 61 K/uL — ABNORMAL LOW (ref 150–400)
RBC: 2.81 MIL/uL — ABNORMAL LOW (ref 4.22–5.81)
RBC: 3.11 MIL/uL — ABNORMAL LOW (ref 4.22–5.81)
RBC: 2.97 MIL/uL — ABNORMAL LOW (ref 4.22–5.81)
RDW: 16.1 % — ABNORMAL HIGH (ref 11.5–15.5)
RDW: 15.9 % — ABNORMAL HIGH (ref 11.5–15.5)
RDW: 16.5 % — ABNORMAL HIGH (ref 11.5–15.5)
WBC: 10.5 10*3/uL (ref 4.0–10.5)
WBC: 10.4 10*3/uL (ref 4.0–10.5)
WBC: 10.6 K/uL — ABNORMAL HIGH (ref 4.0–10.5)
nRBC: 0.8 % — ABNORMAL HIGH (ref 0.0–0.2)
nRBC: 1.1 % — ABNORMAL HIGH (ref 0.0–0.2)
nRBC: 1.1 % — ABNORMAL HIGH (ref 0.0–0.2)

## 2019-08-09 LAB — GLUCOSE, CAPILLARY
Glucose-Capillary: 147 mg/dL — ABNORMAL HIGH (ref 70–99)
Glucose-Capillary: 124 mg/dL — ABNORMAL HIGH (ref 70–99)
Glucose-Capillary: 152 mg/dL — ABNORMAL HIGH (ref 70–99)
Glucose-Capillary: 181 mg/dL — ABNORMAL HIGH (ref 70–99)
Glucose-Capillary: 169 mg/dL — ABNORMAL HIGH (ref 70–99)

## 2019-08-09 LAB — POCT I-STAT 7, (LYTES, BLD GAS, ICA,H+H)
Bicarbonate: 24.2 mmol/L (ref 20.0–28.0)
Bicarbonate: 25.5 mmol/L (ref 20.0–28.0)
Calcium, Ion: 1.23 mmol/L (ref 1.15–1.40)
Calcium, Ion: 1.24 mmol/L (ref 1.15–1.40)
HCT: 25 % — ABNORMAL LOW (ref 39.0–52.0)
HCT: 25 % — ABNORMAL LOW (ref 39.0–52.0)
Hemoglobin: 8.5 g/dL — ABNORMAL LOW (ref 13.0–17.0)
Hemoglobin: 8.5 g/dL — ABNORMAL LOW (ref 13.0–17.0)
O2 Saturation: 97 %
O2 Saturation: 91 %
Patient temperature: 98.1
Patient temperature: 98.3
Potassium: 3.8 mmol/L (ref 3.5–5.1)
Potassium: 4.5 mmol/L (ref 3.5–5.1)
Sodium: 143 mmol/L (ref 135–145)
Sodium: 141 mmol/L (ref 135–145)
TCO2: 25 mmol/L (ref 22–32)
TCO2: 27 mmol/L (ref 22–32)
pCO2 arterial: 35.4 mmHg (ref 32.0–48.0)
pCO2 arterial: 43.2 mmHg (ref 32.0–48.0)
pH, Arterial: 7.441 (ref 7.350–7.450)
pH, Arterial: 7.378 (ref 7.350–7.450)
pO2, Arterial: 84 mmHg (ref 83.0–108.0)
pO2, Arterial: 63 mmHg — ABNORMAL LOW (ref 83.0–108.0)

## 2019-08-09 LAB — APTT
aPTT: 98 seconds — ABNORMAL HIGH (ref 24–36)
aPTT: 61 seconds — ABNORMAL HIGH (ref 24–36)

## 2019-08-09 LAB — HEPARIN LEVEL (UNFRACTIONATED)
Heparin Unfractionated: 0.48 IU/mL (ref 0.30–0.70)
Heparin Unfractionated: 0.55 IU/mL (ref 0.30–0.70)
Heparin Unfractionated: 0.28 IU/mL — ABNORMAL LOW (ref 0.30–0.70)

## 2019-08-09 LAB — HEPATIC FUNCTION PANEL
ALT: 92 U/L — ABNORMAL HIGH (ref 0–44)
AST: 32 U/L (ref 15–41)
Albumin: 3.2 g/dL — ABNORMAL LOW (ref 3.5–5.0)
Alkaline Phosphatase: 43 U/L (ref 38–126)
Bilirubin, Direct: 0.2 mg/dL (ref 0.0–0.2)
Indirect Bilirubin: 0.8 mg/dL (ref 0.3–0.9)
Total Bilirubin: 1 mg/dL (ref 0.3–1.2)
Total Protein: 4.5 g/dL — ABNORMAL LOW (ref 6.5–8.1)

## 2019-08-09 LAB — BASIC METABOLIC PANEL
Anion gap: 6 (ref 5–15)
Anion gap: 7 (ref 5–15)
BUN: 35 mg/dL — ABNORMAL HIGH (ref 6–20)
BUN: 34 mg/dL — ABNORMAL HIGH (ref 6–20)
CO2: 25 mmol/L (ref 22–32)
CO2: 24 mmol/L (ref 22–32)
Calcium: 8.1 mg/dL — ABNORMAL LOW (ref 8.9–10.3)
Calcium: 8.2 mg/dL — ABNORMAL LOW (ref 8.9–10.3)
Chloride: 111 mmol/L (ref 98–111)
Chloride: 107 mmol/L (ref 98–111)
Creatinine, Ser: 0.5 mg/dL — ABNORMAL LOW (ref 0.61–1.24)
Creatinine, Ser: 0.52 mg/dL — ABNORMAL LOW (ref 0.61–1.24)
GFR calc Af Amer: >60 mL/min (ref >60)
GFR calc Af Amer: >60 mL/min (ref >60)
GFR calc non Af Amer: >60 mL/min (ref >60)
GFR calc non Af Amer: >60 mL/min (ref >60)
Glucose, Bld: 137 mg/dL — ABNORMAL HIGH (ref 70–99)
Glucose, Bld: 180 mg/dL — ABNORMAL HIGH (ref 70–99)
Potassium: 4 mmol/L (ref 3.5–5.1)
Potassium: 4.5 mmol/L (ref 3.5–5.1)
Sodium: 142 mmol/L (ref 135–145)
Sodium: 138 mmol/L (ref 135–145)

## 2019-08-09 LAB — LACTATE DEHYDROGENASE: LDH: 316 U/L — ABNORMAL HIGH (ref 98–192)

## 2019-08-09 LAB — TRIGLYCERIDES: Triglycerides: 210 mg/dL — ABNORMAL HIGH (ref <150)

## 2019-08-09 LAB — BPAM CRYOPRECIPITATE
Blood Product Expiration Date: 202012311943
ISSUE DATE / TIME: 202012311445
Unit Type and Rh: 5100

## 2019-08-09 LAB — LACTIC ACID, PLASMA: Lactic Acid, Venous: 1.7 mmol/L (ref 0.5–1.9)

## 2019-08-09 LAB — PREPARE CRYOPRECIPITATE: Unit division: 0

## 2019-08-09 LAB — COOXEMETRY PANEL
Carboxyhemoglobin: 2.4 % — ABNORMAL HIGH (ref 0.5–1.5)
Methemoglobin: 1.3 % (ref 0.0–1.5)
O2 Saturation: 97.1 %
Total hemoglobin: 8.6 g/dL — ABNORMAL LOW (ref 12.0–16.0)

## 2019-08-09 LAB — PROTIME-INR
INR: 1.3 — ABNORMAL HIGH (ref 0.8–1.2)
Prothrombin Time: 15.9 seconds — ABNORMAL HIGH (ref 11.4–15.2)

## 2019-08-09 LAB — FIBRINOGEN: Fibrinogen: 156 mg/dL — ABNORMAL LOW (ref 210–475)

## 2019-08-09 MED ORDER — FUROSEMIDE 10 MG/ML IJ SOLN
10.0000 mg | Freq: Once | INTRAMUSCULAR | Status: AC
  Administered 2019-08-09: 10 mg via INTRAVENOUS
  Filled 2019-08-09: qty 2

## 2019-08-09 MED ORDER — "THROMBI-PAD 3""X3"" EX PADS"
1.0000 | MEDICATED_PAD | Freq: Once | CUTANEOUS | Status: DC
  Filled 2019-08-09 (×4): qty 1

## 2019-08-09 MED ORDER — "THROMBI-PAD 3""X3"" EX PADS"
1.0000 | MEDICATED_PAD | Freq: Once | CUTANEOUS | Status: AC
  Administered 2019-08-09: 1 via TOPICAL
  Filled 2019-08-09: qty 1

## 2019-08-09 MED ORDER — ALBUMIN HUMAN 5 % IV SOLN
12.5000 g | Freq: Once | INTRAVENOUS | Status: AC
  Administered 2019-08-09: 12.5 g via INTRAVENOUS

## 2019-08-09 MED ORDER — SODIUM CHLORIDE 0.9 % IV SOLN
30.0000 ug | Freq: Once | INTRAVENOUS | Status: AC
  Administered 2019-08-09: 30 ug via INTRAVENOUS
  Filled 2019-08-09: qty 7.5

## 2019-08-09 MED ORDER — ALBUMIN HUMAN 5 % IV SOLN
INTRAVENOUS | Status: AC
  Filled 2019-08-09: qty 250

## 2019-08-09 MED ORDER — LABETALOL HCL 5 MG/ML IV SOLN
10.0000 mg | INTRAVENOUS | Status: AC | PRN
  Administered 2019-08-09 – 2019-08-26 (×10): 10 mg via INTRAVENOUS
  Filled 2019-08-09 (×11): qty 4

## 2019-08-09 MED ORDER — INSULIN ASPART 100 UNIT/ML ~~LOC~~ SOLN
1.0000 [IU] | SUBCUTANEOUS | Status: DC
  Administered 2019-08-09 – 2019-08-13 (×24): 1 [IU] via SUBCUTANEOUS

## 2019-08-09 MED ORDER — HEPARIN (PORCINE) 25000 UT/250ML-% IV SOLN
1450.0000 [IU]/h | INTRAVENOUS | Status: DC
  Administered 2019-08-09: 1400 [IU]/h via INTRAVENOUS
  Administered 2019-08-10: 05:00:00 1450 [IU]/h via INTRAVENOUS
  Filled 2019-08-09: qty 250

## 2019-08-09 MED ORDER — ALBUMIN HUMAN 5 % IV SOLN
25.0000 g | Freq: Once | INTRAVENOUS | Status: AC
  Administered 2019-08-09: 25 g via INTRAVENOUS
  Filled 2019-08-09: qty 250

## 2019-08-09 MED ORDER — SODIUM CHLORIDE 0.9% IV SOLUTION: Freq: Once | INTRAVENOUS | Status: AC

## 2019-08-09 NOTE — Progress Notes (Signed)
RN spoke with family members via conference call. Updated and addressed all questions/concerns to the best of ability. Family aware that they can video conference with patient at any time.

## 2019-08-09 NOTE — Progress Notes (Signed)
PT Cancellation Note  Patient Details Name: [name] MRN: [id number] DOB: [birth year]   Cancelled Treatment:    Reason Eval/Treat Not Completed: Medical issues which prohibited therapy. Pt remains on vent via trach and sedated. Pt also with new bleeding from chest tube. PT will sign off at this time due to continued medical complications limiting initiation of Acute PT services. Please re-consult acute PT services if pt becomes more alert and able to follow commands and participate in PT session.   Zenaida Niece 08/09/2019, 1:18 PM

## 2019-08-09 NOTE — Progress Notes (Signed)
ANTICOAGULATION CONSULT NOTE  Pharmacy Consult for heparin Indication: ECMO  No Known Allergies  Patient Measurements: Height: 5' 8"  (172.7 cm) Weight: 276 lb 7.3 oz (125.4 kg) IBW/kg (Calculated) : 68.4 Heparin Dosing Weight: 100.7 kg  Vital Signs: Temp: 97.7 F (36.5 C) (01/01 0200) Temp Source: Core (01/01 0200) BP: 142/52 (01/01 0157) Pulse Rate: 58 (01/01 0215)  Labs: Recent Labs    08/06/19 0457 08/06/19 1005 08/07/19 0344 08/07/19 0429 08/07/19 1600 08/07/19 1600 08/07/19 2157 08/08/19 0300 08/08/19 0314 08/08/19 1341 08/08/19 1630 08/08/19 1904 08/08/19 2049 08/09/19 0113  HGB 10.8*  10.5*  --  9.4*  --   --    < > 8.1*  --  8.1* 9.3* 8.8* 8.2* 6.5* 8.2*  HCT 34.3*  31.0*  --  30.7*  --   --    < > 25.8*  --  25.1* 29.0* 27.6* 24.0* 19.0* 25.3*  PLT 71*   < > 62*  --   --    < > 55*  --  55* 49* 51*  --   --  48*  APTT 73*  --  77*  --  78*  --  90*  --  86*  --  117*  --   --   --   LABPROT 16.1*  --  16.6*  --   --   --   --   --  17.0*  --   --   --   --   --   INR 1.3*  --  1.4*  --   --   --   --   --  1.4*  --   --   --   --   --   HEPARINUNFRC 0.72*   < > 0.60   < > 0.54  --   --  0.64  --   --  0.80*  --   --  0.48  CREATININE 0.61   < > 0.57*  --  0.66  --   --   --  0.64  --  0.57*  --   --   --    < > = values in this interval not displayed.    Estimated Creatinine Clearance: 150.4 mL/min (A) (by C-G formula based on SCr of 0.57 mg/dL (L)).   Assessment: 44 yo male known COVID+ and ARDS VV ECMO for heparin.     Goal of Therapy:  Heparin level 0.5-0.7 units/ml Monitor platelets by anticoagulation protocol: Yes   Plan:  Continue Heparin at current rate  Follow-up am labs.   Phillis Knack, PharmD, BCPS   08/09/2019 2:50 AM

## 2019-08-09 NOTE — Consult Note (Addendum)
Consultation Note Date: 08/09/2019   Patient Name: [name]  DOB: Jun 15, 1976  MRN: [id number]  Age / Sex: 44 y.o., male  PCP: [name] Roers, MD Referring Physician: [name], MD  Reason for Consultation: Establishing goals of care  HPI/Patient Profile: 44 y/o M admitted 12/14 with COVIDpneumonia causing acutehypoxemic respiratory failure. Developed pneumomediastinum 12/23 with concern for possible bilateral small pneumothoraces on 12/24. PCCM consulted for evaluation of pneumothoraces.  Clinical Assessment and Goals of Care: Chart reviewed. Patient has had a complicated hospital course in the setting of COVID-19 PNA. On 12/23 w/pneumomediastinum, on 12/26 bilateral chest tubes were placed and patient was intubated in the setting of worsening hypoxemia. Identified to have progressively worsening ARDS on 12/26 with hypoxia full support. Planned for VV ECMO on 12/27. Since cannulation patient has remained sedated. Some complications have included bleeding from nose, around tracheostomy, and around chest tube sites in the setting of heparinization.   Palliative Care called patients mother, [name] this afternoon to introduce our team for ongoing support. Explained what Palliative care is and why we are involved in [name]'s case.  [name] was taken aback when called and asked if [name] was dying. Her experience with Palliative care had been with her father who died a week after enrollment in their services. Shared with [name] and [name] ([name]'s sister) that our goal was to establish a rapport with them to get a better understanding of [name] [name] shared that prior to hospitalization [name] was like any working man. He would go to work at night in the [name] and come home to his wife and child. She said that he was a hard working man. This has been a struggle for she and her family as they cannot see or  spend time with [name]  [name] seemed very aloof over the phone and kept asking why we were calling. She said that she was "scared that he was dying." I shared with her that from the perspective of his heart it seems he is stable on ecmo. I shared that he has a ventilator doing the work of his lungs which right now seems stable as well. We discussed that he has had some issues with bleeding over the last few days which she said she was aware of.  [name] and [name] said that they felt "in the dark" about what was going on with [name] I ask them what we could do to make them feel better informed. They requested more frequent updates. We talked about them making a list of questions that could be addressed on our next phone call. Endorsed that whatever we were unable to answer we would then pass along to the Pulmonology and Cardiology teams to help answer.   We discussed arranging a ZOOM call to help the family members see [name] Alerted them that he has multiple devices, lines, and tubes on/out of him that may appear distressing. They said that they understood. Plan to coordinate this in the oncoming days.  [name]'s sister [name] asked honestly what his status was. I  shared that there is concern by some of the medical teams that his likelihood of making a good recovery from this is poor. His body has endured a tremendous burden. She seemed to understand this though the family remains to be quite hopeful as they are of faith. We talked about taking his course one day at a time.   Have left [name]'s wife, [name] a message to update her on our involvement in [name]'s care.   We plan to be involved in patients care moving forward.  SUMMARY OF RECOMMENDATIONS   - Chaplain Consult - Arrange a family Zoom meeting - Continue to talk about patient status   Code Status/Advance Care Planning:  Full code   Symptom Management:   No Palliative Symptoms to manage presently  Palliative Prophylaxis:   Aspiration,  Bowel Regimen, Delirium Protocol, Eye Care, Frequent Pain Assessment, Oral Care and Turn Reposition  Additional Recommendations (Limitations, Scope, Preferences):  Full Scope Treatment  Psycho-social/Spiritual:   Desire for further Chaplaincy support:yes  Additional Recommendations: Caregiving  Support/Resources  Prognosis:   Unable to determine  Discharge Planning: To Be Determined      Primary Diagnoses: Present on Admission: . Acute respiratory failure with hypoxia ([name]) . Essential hypertension . GERD (gastroesophageal reflux disease) . Thrombocytopenia ([place]) . Leukopenia  I have reviewed the medical record, interviewed the patient and family, and examined the patient. The following aspects are pertinent.  Past Medical History:  Diagnosis Date  . Asthma   . GERD (gastroesophageal reflux disease)   . Headache   . History of kidney stones    LEFT URETERAL STONE  . HTN (hypertension)   . Pancreatitis 2018   GALLBALDDER SLUDGE CAUSED ISSUED RESOLVED   Social History   Socioeconomic History  . Marital status: Married    Spouse name: Not on file  . Number of children: 1  . Years of education: Not on file  . Highest education level: Not on file  Occupational History  . Occupation: delivery driver  Tobacco Use  . Smoking status: Never Smoker  . Smokeless tobacco: Never Used  Substance and Sexual Activity  . Alcohol use: Yes    Comment: rarely 2-3 times a year  . Drug use: No  . Sexual activity: Yes    Partners: Female  Other Topics Concern  . Not on file  Social History Narrative  . Not on file   Social Determinants of Health   Financial Resource Strain:   . Difficulty of Paying Living Expenses: Not on file  Food Insecurity:   . Worried About Charity fundraiser in the Last Year: Not on file  . Ran Out of Food in the Last Year: Not on file  Transportation Needs:   . Lack of Transportation (Medical): Not on file  . Lack of Transportation  (Non-Medical): Not on file  Physical Activity:   . Days of Exercise per Week: Not on file  . Minutes of Exercise per Session: Not on file  Stress:   . Feeling of Stress : Not on file  Social Connections:   . Frequency of Communication with Friends and Family: Not on file  . Frequency of Social Gatherings with Friends and Family: Not on file  . Attends Religious Services: Not on file  . Active Member of Clubs or Organizations: Not on file  . Attends Archivist Meetings: Not on file  . Marital Status: Not on file  Married One son Has dogs Works at [employer]  History  Problem Relation Age of Onset  . Asthma Brother   . Diabetes Father   . Hypertension Father   . Diabetes Paternal Grandmother   . Hypertension Paternal Grandmother   . Migraines Mother   . GER disease Mother   . Pancreatic cancer Maternal Grandmother   . Heart disease Maternal Grandfather    Scheduled Meds: . sodium chloride   Intravenous Once  . artificial tears  1 application Both Eyes K9X  . vitamin C  500 mg Per Tube Daily  . chlorhexidine gluconate (MEDLINE KIT)  15 mL Mouth Rinse BID  . chlorhexidine gluconate (MEDLINE KIT)  15 mL Mouth Rinse BID  . Chlorhexidine Gluconate Cloth  6 each Topical Daily  . clonazePAM  2 mg Per Tube BID  . dexamethasone (DECADRON) injection  3 mg Intravenous Q24H   Followed by  . [START ON 08/13/2019] dexamethasone (DECADRON) injection  1 mg Intravenous Q24H  . feeding supplement (PRO-STAT SUGAR FREE 64)  30 mL Per Tube BID  . free water  400 mL Per Tube Q8H  . gabapentin  300 mg Per Tube Q8H  . insulin aspart  1 Units Subcutaneous Q4H  . insulin aspart  3-9 Units Subcutaneous Q4H  . magnesium hydroxide  15 mL Oral Daily  . mouth rinse  15 mL Mouth Rinse BID  . mouth rinse  15 mL Mouth Rinse 10 times per day  . oxyCODONE  10 mg Per Tube Q6H  . pantoprazole (PROTONIX) IV  40 mg Intravenous QHS  . QUEtiapine  25 mg Per Tube BID  . senna-docusate  1  tablet Per Tube BID  . sodium chloride flush  10-40 mL Intracatheter Q12H  . zinc sulfate  220 mg Per Tube Daily   Continuous Infusions: . sodium chloride    . sodium chloride Stopped (08/08/19 1922)  . cisatracurium (NIMBEX) infusion    . dextrose    . feeding supplement (PIVOT 1.5 CAL) 1,000 mL (08/08/19 1047)  . heparin 1,400 Units/hr (08/09/19 1051)  . HYDROmorphone 4 mg/hr (08/09/19 1051)  . midazolam (VERSED) infusion 14 mg/hr (08/09/19 1052)  . propofol (DIPRIVAN) infusion 40 mcg/kg/min (08/09/19 1050)   PRN Meds:.Place/Maintain arterial line **AND** sodium chloride, sodium chloride, acetaminophen, dextrose, hydrALAZINE, HYDROmorphone, metoprolol tartrate, midazolam, ondansetron (ZOFRAN) IV, sodium chloride, sodium chloride flush Medications Prior to Admission:  Prior to Admission medications   Medication Sig Start Date End Date Taking? Authorizing Provider  Albuterol Sulfate (PROAIR RESPICLICK) 833 (90 Base) MCG/ACT AEPB Inhale 2 puffs into the lungs every 4 (four) hours as needed (Shortness of breath). 05/28/19  Yes Valentina Shaggy, MD  budesonide-formoterol [facility]) 160-4.5 MCG/ACT inhaler Inhale 2 puffs into the lungs daily as needed (cough or wheeze). Inhale two puffs ONCE daily to prevent cough or wheeze. Rinse, gargle, and spit after use. Patient taking differently: Inhale 2 puffs into the lungs daily as needed (cough or wheeze). Rinse, gargle and spit after use. 05/28/19  Yes Valentina Shaggy, MD  cetirizine (ZYRTEC) 10 MG tablet Take 1 tablet (10 mg total) by mouth daily. 02/06/18  Yes Kozlow, Donnamarie Poag, MD  lisinopril-hydrochlorothiazide (ZESTORETIC) 10-12.5 MG tablet Take 1 tablet by mouth daily. 04/08/19  Yes [provider]  omeprazole (PRILOSEC) 20 MG capsule Take 40 mg by mouth daily.    Yes [provider]  triamcinolone (NASACORT ALLERGY 24HR) 55 MCG/ACT AERO nasal inhaler Place 2 sprays into the nose daily. Patient taking differently: Place  2 sprays into the nose daily as  needed (allergies).  02/06/18  Yes Kozlow, Donnamarie Poag, MD   No Known Allergies Review of Systems  Unable to perform ROS  Physical Exam Constitutional:      Comments: Sedated  HENT:     Head: Normocephalic.     Nose:     Comments: Nasal packing in place    Mouth/Throat:     Mouth: Mucous membranes are dry.     Comments: Tracheostomy with dried blood around the orafice Cardiovascular:     Rate and Rhythm: Normal rate.     Pulses: Normal pulses.  Pulmonary:     Comments: Hard to hear over vent Skin:    General: Skin is warm.     Capillary Refill: Capillary refill takes less than 2 seconds.     Coloration: Skin is pale.  Neurological:     Comments: UTA    Vital Signs: BP (!) 115/44   Pulse 61   Temp 98 F (36.7 C) (Oral)   Resp (!) 0   Ht 5' 8" (1.727 m)   Wt 129.7 kg   SpO2 99%   BMI 43.48 kg/m  Pain Scale: CPOT POSS *See Group Information*: 1-Acceptable,Awake and alert Pain Score: Asleep   SpO2: SpO2: 99 % O2 Device:SpO2: 99 % O2 Flow Rate: .O2 Flow Rate (L/min): 45 L/min(45L 15L NRB)  IO: Intake/output summary:   Intake/Output Summary (Last 24 hours) at 08/09/2019 1115 Last data filed at 08/09/2019 1100 Gross per 24 hour  Intake 6386.3 ml  Output 2830 ml  Net 3556.3 ml    LBM: Last BM Date: 08/01/19 Baseline Weight: Weight: 136.1 kg Most recent weight: Weight: 129.7 kg     Palliative Assessment/Data: 10% at the present time  Time In: 1045 Time Out: 1200 Time Total: 75 Greater than 50%  of this time was spent counseling and coordinating care related to the above assessment and plan. _______________________________________________________________________________________' Patients family called back in afternoon. Spoke with [name] ([name]'s sister) and [name] ([name]'s wife) over the phone. They stated that they had created at list of questions that they asked if they could be emailed to the palliative team. Shared with them that once  we get the questions we would talk to the other services to get answers. Plan for a family meeting on Sunday at 1300 via zoom.  No additional charge for time  Signed by: Rosezella Rumpf, NP   Please contact Palliative Medicine Team phone at [phone] for questions and concerns.  For individual provider: See Shea Evans

## 2019-08-09 NOTE — Progress Notes (Addendum)
ANTICOAGULATION CONSULT NOTE  Pharmacy Consult for heparin Indication: ECMO  No Known Allergies  Patient Measurements: Height: _0  (172.7 cm) Weight: 285 lb 15 oz (129.7 kg) IBW/kg (Calculated) : 68.4 Heparin Dosing Weight: 100.7 kg  Vital Signs: Temp: 97.7 F (36.5 C) (01/01 0325) Temp Source: Core (01/01 0325) BP: 146/53 (01/01 0334) Pulse Rate: 56 (01/01 0445)  Labs: Recent Labs    08/07/19 0344 08/07/19 0429 08/08/19 0314 08/08/19 0811 08/08/19 1630 08/08/19 2049 08/09/19 0113 08/09/19 0453  HGB 9.4*  --  8.1*  --  8.8* 6.5* 8.2* 9.2*  HCT 30.7*  --  25.1*  --  27.6* 19.0* 25.3* 28.0*  PLT 62*   < > 55*   < > [name]*  --  48* 49*  APTT 77*   < > 86*  --  117*  --   --  98*  LABPROT 16.6*  --  17.0*  --   --   --   --  15.9*  INR 1.4*  --  1.4*  --   --   --   --  1.3*  HEPARINUNFRC 0.60   < >  --   --  0.80*  --  0.48 0.55  CREATININE 0.57*   < > 0.64  --  0.57*  --   --  0.50*   < > = values in this interval not displayed.    Estimated Creatinine Clearance: 153.2 mL/min (A) (by C-G formula based on SCr of 0.5 mg/dL (L)).   Medical History: Past Medical History:  Diagnosis Date  . Asthma   . GERD (gastroesophageal reflux disease)   . Headache   . History of kidney stones    LEFT URETERAL STONE  . HTN (hypertension)   . Pancreatitis 2018   GALLBALDDER SLUDGE CAUSED ISSUED RESOLVED    Medications:  Scheduled:  . artificial tears  1 application Both Eyes W0J  . vitamin C  500 mg Per Tube Daily  . chlorhexidine gluconate (MEDLINE KIT)  15 mL Mouth Rinse BID  . chlorhexidine gluconate (MEDLINE KIT)  15 mL Mouth Rinse BID  . Chlorhexidine Gluconate Cloth  6 each Topical Daily  . clonazePAM  2 mg Per Tube BID  . dexamethasone (DECADRON) injection  3 mg Intravenous Q24H   Followed by  . [START ON 08/13/2019] dexamethasone (DECADRON) injection  1 mg Intravenous Q24H  . feeding supplement (PRO-STAT SUGAR FREE 64)  30 mL Per Tube BID  . free water  400 mL Per  Tube Q8H  . gabapentin  300 mg Per Tube Q8H  . insulin aspart  1 Units Subcutaneous Q4H  . insulin aspart  3-9 Units Subcutaneous Q4H  . magnesium hydroxide  15 mL Oral Daily  . mouth rinse  15 mL Mouth Rinse BID  . mouth rinse  15 mL Mouth Rinse 10 times per day  . oxyCODONE  10 mg Per Tube Q6H  . pantoprazole (PROTONIX) IV  40 mg Intravenous QHS  . QUEtiapine  25 mg Per Tube BID  . senna-docusate  1 tablet Per Tube BID  . sodium chloride flush  10-40 mL Intracatheter Q12H  . zinc sulfate  220 mg Per Tube Daily    Assessment: [name] known COVID+, has been declining with ARDS for past 48 hours requiring intubation and max vent settings. Underwent VV ECMO cannulation on 12/26 and now continues on IV heparin.  Heparin level this morning is within goal at 0.55, aPTT within goal at 98. Hgb is  9.2, pltc 49.  Per RN, oozing has remained about the same as yesterday.    Goal of Therapy:  Heparin level 0.3-0.5 units/ml >>targeting lower goal with bleed aPTT 66-85 seconds  Monitor platelets by anticoagulation protocol: Yes   Plan:  Continue heparin at 1600 units/hr Check heparin levels at 5AM/5 PM. Will discuss with ECMO team prior to heparin rate changes  Vertis Kelch, PharmD, [facility] PGY2 Cardiology Pharmacy Resident Phone [phone] 08/09/2019       7:21 AM    Addendum: Per Dr. [name], hold heparin for a couple hours with persistent bleeding, then restart heparin at 1400 units/hr to target heparin level ~0.4.  Vertis Kelch, PharmD, [facility] PGY2 Cardiology Pharmacy Resident Phone [phone] 08/09/2019       8:56 AM  Please check [email or web address] for unit-specific pharmacist phone numbers

## 2019-08-09 NOTE — Progress Notes (Addendum)
NAME:  [name], MRN:  [id number], DOB:  Nov 17, 1975, LOS: 38 ADMISSION DATE:  07/22/2019, CONSULTATION DATE:  12/24 REFERRING MD:  Sloan Leiter, CHIEF COMPLAINT:  Dyspnea   Brief History   44 y/o M admitted 12/14 with COVID pneumonia causing acute hypoxemic respiratory failure.   Developed pneumomediastinum 12/23 with concern for possible bilateral small pneumothoraces on 12/24.  PCCM consulted for evaluation of pneumothoraces  Past Medical History  GERD HTN Asthma  Significant Hospital Events   12/14 admit with hypoxemic respiratory failure in setting of COVID-19 pneumonia 12/23 noted to have pneumomediastinum on chest x-ray 12/24 PCCM consulted for evaluation 12/26 worsening hypoxemia, intubated  12/27 VV fem/fem ECMO cannulation 12/28 ETT switch 12/29 Tracheostomy 12/31 Oozing from multiple sites, H/H drop. Heparin held briefly and lidocaine with epi injected around trach site, Cryo, DDVAP given  Consults:  PCCM  Procedures:    Significant Diagnostic Tests:  CT chest 12/22 >> extensive pneumomediastinum, multifocal patchy bilateral groundglass opacitiesCT chest 12/22 >> extensive pneumomediastinum, multifocal patchy bilateral groundglass opacities  Micro Data:  BCx2 12/14 >> negative  Sputum 12/15 >> negative   Antimicrobials:  Received remdesivir, steroids, actemra and convalescent plasma  5 days cefepime 12/24-12/29   Interim history/subjective:  Heavily sedated, continues on VV ECMO Has mild oozing from trach site.   Objective   Blood pressure (!) 104/52, pulse (!) 58, temperature 97.7 F (36.5 C), temperature source Core, resp. rate (!) 0, height 5' 8"  (1.727 m), weight 129.7 kg, SpO2 100 %. CVP:  [11 mmHg-22 mmHg] 12 mmHg  Vent Mode: PCV FiO2 (%):  [50 %] 50 % Set Rate:  [20 bmp] 20 bmp PEEP:  [10 cmH20] 10 cmH20 Plateau Pressure:  [20 cmH20-22 cmH20] 22 cmH20   Intake/Output Summary (Last 24 hours) at 08/09/2019 0835 Last data filed at 08/09/2019  0800 Gross per 24 hour  Intake 7605.14 ml  Output 2625 ml  Net 4980.14 ml   Filed Weights   08/07/19 0421 08/08/19 0300 08/09/19 0500  Weight: 122 kg 125.4 kg 129.7 kg    Examination Gen:      No acute distress HEENT:  EOMI, sclera anicteric Neck:     No masses; no thyromegaly, trach in place Lungs:    Clear to auscultation bilaterally; normal respiratory effort CV:         Regular rate and rhythm; no murmurs Abd:      + bowel sounds; soft, non-tender; no palpable masses, no distension Ext:    No edema; adequate peripheral perfusion Skin:      Warm and dry; no rash Neuro: Sedated, unresponsive.  Resolved Hospital Problem list     Assessment & Plan:  # Acute Hypoxemic Respiratory Failure secondary to COVID- finished remdesivir, got actemra, activated plasma, on steroids.  Inability to ventilate/oxygenate leading to VV ECMO cannulation  # Bilateral pneumothoraces and pneumomediastinum  Continue current vent settings.  ABG reviewed with adequate oxygenation and ventilation TV higher today on pressure support which may indicate improving compliance.  Follow chest x-ray, ABG Continue sedation with Dilaudid, Versed, propofol. Follow triglycerides Lasix for diuresis.   Coagulopathy, blood loss anemia S/p cryo, DDAVP Continue heparin via ECMO circuit. Will hold briefly again today and restart at lower dose.  Discussed with cardiology and CVTS Monitor CBC  Hyperglycemia Continue SSI coverage  Best practice:   Feeding: TF, FWF Analgesia: see above Sedation: see above Thromboprophylaxis: heparin gtt Ulcer prophylaxis: pantoprazole Glucose control: SSI Family: Called wife 1/1 but got voice mail  Labs  CBC: Recent Labs  Lab 08/07/19 2157 08/08/19 0314 08/08/19 1341 08/08/19 1630 08/08/19 1904 08/08/19 2049 08/09/19 0113 08/09/19 0453 08/09/19 0805  WBC 7.2 8.4  --  11.4*  --   --  10.5 10.4  --   NEUTROABS 4.7  --   --   --   --   --   --   --   --   HGB 8.1*  8.1* 9.3* 8.8* 8.2* 6.5* 8.2* 9.2* 8.5*  HCT 25.8* 25.1* 29.0* 27.6* 24.0* 19.0* 25.3* 28.0* 25.0*  MCV 90.5 89.3  --  92.0  --   --  90.0 90.0  --   PLT 55* 55* 49* 51*  --   --  48* 49*  --     Basic Metabolic Panel: Recent Labs  Lab 08/03/19 1040 08/03/19 1542 08/03/19 2131 08/03/19 2222 08/04/19 1644 08/04/19 1651 08/07/19 0344 08/07/19 1600 08/08/19 0314 08/08/19 1630 08/08/19 1844 08/08/19 1904 08/08/19 2049 08/09/19 0453 08/09/19 0805  NA  --   --   --   --  136  --  143 144 144 143 145 144 144 142 143  K  --   --   --   --  4.7  --  4.4 4.1 3.8 4.7 4.7 4.6 4.5 4.0 3.8  CL  --    < >  --    < > 108   < > 113* 114* 112* 115*  --   --   --  111  --   CO2  --    < >  --    < > 24   < > 25 23 24 23   --   --   --  25  --   GLUCOSE  --    < >  --    < > 167*   < > 90 117* 113* 184*  --   --   --  137*  --   BUN  --    < >  --    < > 39*   < > 27* 25* 33* 37*  --   --   --  35*  --   CREATININE  --    < >  --    < > 0.74   < > 0.57* 0.66 0.64 0.57*  --   --   --  0.50*  --   CALCIUM  --    < >  --    < > 7.5*   < > 8.1* 8.5* 8.4* 8.1*  --   --   --  8.1*  --   MG 2.4  --   --   --  2.8*  --   --   --   --   --   --   --   --   --   --   PHOS 7.1*  --  4.2  --  2.7  --   --   --   --   --   --   --   --   --   --    < > = values in this interval not displayed.   GFR: Estimated Creatinine Clearance: 153.2 mL/min (A) (by C-G formula based on SCr of 0.5 mg/dL (L)). Recent Labs  Lab 08/05/19 1104 08/06/19 0532 08/07/19 0831 08/08/19 0313 08/08/19 0314 08/08/19 1630 08/09/19 0113 08/09/19 0453  PROCALCITON <0.10  --   --   --   --   --   --   --  WBC  --   --   --   --  8.4 11.4* 10.5 10.4  LATICACIDVEN  --  1.6 1.0 1.5  --   --   --  1.7    Liver Function Tests: Recent Labs  Lab 08/05/19 0416 08/06/19 0457 08/07/19 0344 08/08/19 0314 08/09/19 0453  AST 22 19 18  62* 32  ALT 19 17 19  95* 92*  ALKPHOS 46 38 32* 45 43  BILITOT 1.0 0.8 0.9 0.9 1.0  PROT 4.5*  4.8* 4.8* 4.6* 4.5*  ALBUMIN 2.7* 2.8* 3.1* 3.2* 3.2*   No results for input(s): LIPASE, AMYLASE in the last 168 hours. No results for input(s): AMMONIA in the last 168 hours.  ABG    Component Value Date/Time   PHART 7.441 08/09/2019 0805   PCO2ART 35.4 08/09/2019 0805   PO2ART 84.0 08/09/2019 0805   HCO3 24.2 08/09/2019 0805   TCO2 25 08/09/2019 0805   ACIDBASEDEF 1.0 08/08/2019 2049   O2SAT 97.0 08/09/2019 0805     Coagulation Profile: Recent Labs  Lab 08/05/19 0416 08/06/19 0457 08/07/19 0344 08/08/19 0314 08/09/19 0453  INR 1.4* 1.3* 1.4* 1.4* 1.3*    Cardiac Enzymes: No results for input(s): CKTOTAL, CKMB, CKMBINDEX, TROPONINI in the last 168 hours.  HbA1C: Hgb A1c MFr Bld  Date/Time Value Ref Range Status  07/24/2019 04:50 AM 6.7 (H) 4.8 - 5.6 % Final    Comment:    (NOTE) Pre diabetes:          5.7%-6.4% Diabetes:              >6.4% Glycemic control for   <7.0% adults with diabetes   09/30/2017 05:36 AM 5.7 (H) 4.8 - 5.6 % Final    Comment:    (NOTE)         Prediabetes: 5.7 - 6.4         Diabetes: >6.4         Glycemic control for adults with diabetes: <7.0     CBG: Recent Labs  Lab 08/08/19 1516 08/08/19 1938 08/08/19 2349 08/09/19 0312 08/09/19 0804  GLUCAP 169* 163* 146* 147* 124*     Critical care time:    The patient is critically ill with multiple organ system failure and requires high complexity decision making for assessment and support, frequent evaluation and titration of therapies, advanced monitoring, review of radiographic studies and interpretation of complex data.   Critical Care Time devoted to patient care services, exclusive of separately billable procedures, described in this note is 35 minutes.   [name] MD [facility] Please see [email or web address] for pager details.  08/09/2019, 8:35 AM

## 2019-08-09 NOTE — Progress Notes (Signed)
ANTICOAGULATION CONSULT NOTE - Follow Up Pharmacy Consult for heparin Indication: ECMO  No Known Allergies  Patient Measurements: Height: 5' 8"  (172.7 cm) Weight: 285 lb 15 oz (129.7 kg) IBW/kg (Calculated) : 68.4 Heparin Dosing Weight: 100.7 kg  Vital Signs: Temp: 98 F (36.7 C) (01/01 1400) Temp Source: Oral (01/01 1400) BP: 115/44 (01/01 1700) Pulse Rate: 66 (01/01 1715)  Labs: Recent Labs    08/07/19 0344 08/07/19 0429 08/08/19 0314 08/08/19 0811 08/08/19 1630 08/09/19 0113 08/09/19 0453 08/09/19 0805 08/09/19 1632 08/09/19 1635  HGB 9.4*  --  8.1*  --  8.8* 8.2* 9.2* 8.5* 8.5* 8.8*  HCT 30.7*  --  25.1*  --  27.6* 25.3* 28.0* 25.0* 25.0* 27.3*  PLT 62*   < > 55*   < > 51* 48* 49*  --   --  61*  APTT 77*   < > 86*  --  117*  --  98*  --   --  61*  LABPROT 16.6*  --  17.0*  --   --   --  15.9*  --   --   --   INR 1.4*  --  1.4*  --   --   --  1.3*  --   --   --   HEPARINUNFRC 0.60   < >  --   --  0.80* 0.48 0.55  --   --  0.28*  CREATININE 0.57*   < > 0.64  --  0.57*  --  0.50*  --   --  0.52*   < > = values in this interval not displayed.    Estimated Creatinine Clearance: 153.2 mL/min (A) (by C-G formula based on SCr of 0.52 mg/dL (L)).   Medical History: Past Medical History:  Diagnosis Date  . Asthma   . GERD (gastroesophageal reflux disease)   . Headache   . History of kidney stones    LEFT URETERAL STONE  . HTN (hypertension)   . Pancreatitis 2018   GALLBALDDER SLUDGE CAUSED ISSUED RESOLVED    Medications:  Scheduled:  . artificial tears  1 application Both Eyes G6Y  . vitamin C  500 mg Per Tube Daily  . chlorhexidine gluconate (MEDLINE KIT)  15 mL Mouth Rinse BID  . chlorhexidine gluconate (MEDLINE KIT)  15 mL Mouth Rinse BID  . Chlorhexidine Gluconate Cloth  6 each Topical Daily  . clonazePAM  2 mg Per Tube BID  . dexamethasone (DECADRON) injection  3 mg Intravenous Q24H   Followed by  . [START ON 08/13/2019] dexamethasone (DECADRON)  injection  1 mg Intravenous Q24H  . feeding supplement (PRO-STAT SUGAR FREE 64)  30 mL Per Tube BID  . free water  400 mL Per Tube Q8H  . gabapentin  300 mg Per Tube Q8H  . insulin aspart  1 Units Subcutaneous Q4H  . insulin aspart  3-9 Units Subcutaneous Q4H  . magnesium hydroxide  15 mL Oral Daily  . mouth rinse  15 mL Mouth Rinse BID  . mouth rinse  15 mL Mouth Rinse 10 times per day  . oxyCODONE  10 mg Per Tube Q6H  . pantoprazole (PROTONIX) IV  40 mg Intravenous QHS  . QUEtiapine  25 mg Per Tube BID  . senna-docusate  1 tablet Per Tube BID  . sodium chloride flush  10-40 mL Intracatheter Q12H  . zinc sulfate  220 mg Per Tube Daily    Assessment: 41 yom known COVID+, has been declining with ARDS for  past 48 hours requiring intubation and max vent settings. Underwent VV ECMO cannulation on 12/26 and now continues on IV heparin.  Heparin was held x 2 hours today and rate reduced to 1400 units/hr due to ongoing bleeding / oozing at chest tube site.  Heparin level on this rate is slightly below goal.  Pt also received a unit of platelets today.  Spoke with bedside RN who reported bleeding / oozing stable at this time.  Will increase to 1450 units/hr and continue to target the lower end of goal range.      Goal of Therapy:  Heparin level 0.3-0.5 units/ml >>targeting ~0.4 with bleed aPTT 66-85 seconds  Monitor platelets by anticoagulation protocol: Yes   Plan:  Increase heparin to 1450 units/hr Check heparin levels at 5AM/5 PM. Will discuss with ECMO team prior to heparin rate changes   [employer], Pharm.D., BCPS Clinical Pharmacist Clinical phone for 08/09/2019 from 8:30-4:00 is [phone].  **Pharmacist phone directory can be found on [email or web address] listed under [place].  08/09/2019 5:33 PM

## 2019-08-09 NOTE — Progress Notes (Addendum)
Patient ID: [name], male   DOB: 11-May-1976, 44 y.o.   MRN: [id number]    Advanced Heart Failure Rounding Note   Subjective:    Stable on VV ecmo with good flows and oxygen saturation 99%. No pressors, stable to high SBP. Lactate normal. On Dilaudid, Propofol, Versed for sedation. S/p tracheostomy with bleeding from trach site, got 2 units overnight.   CXR: Bilateral lung opacities c/w ARDS  No Lasix yesterday => weight up 9 lbs and I/Os markedly positive.   Platelets 76=>71=>62=>51=>48, LDH 562=>538=>444=>394 => 316.     Afebrile, off abx.   On vent 50% PEEP 10. Bilateral CTs in for PTXs  ECMO parameters Flow 4.8 RPM 3400 dP 16 Pressure -85   Objective:   Weight Range:  Vital Signs:   Temp:  [97.2 F (36.2 C)-98.4 F (36.9 C)] 97.7 F (36.5 C) (01/01 0325) Pulse Rate:  [54-91] 61 (01/01 0837) Resp:  [0-23] 20 (01/01 0837) BP: (82-176)/(38-65) 104/52 (01/01 0815) SpO2:  [87 %-100 %] 100 % (01/01 0837) Arterial Line BP: (130-184)/(37-67) 154/53 (01/01 0830) FiO2 (%):  [50 %] 50 % (01/01 0837) Weight:  [129.7 kg] 129.7 kg (01/01 0500) Last BM Date: 08/01/19  Weight change: Filed Weights   08/07/19 0421 08/08/19 0300 08/09/19 0500  Weight: 122 kg 125.4 kg 129.7 kg    Intake/Output:   Intake/Output Summary (Last 24 hours) at 08/09/2019 0855 Last data filed at 08/09/2019 0800 Gross per 24 hour  Intake 7605.14 ml  Output 2625 ml  Net 4980.14 ml     Physical Exam: General: Sedated on vent Neck: Thick, JVP difficult, no thyromegaly or thyroid nodule.  Lungs: Clear to auscultation bilaterally with normal respiratory effort. CV: Nonpalpable PMI.  Heart regular S1/S2, no S3/S4, no murmur.  1+ edema to knees.   Abdomen: Soft, nontender, no hepatosplenomegaly, no distention.  Skin: Intact without lesions or rashes.  Neurologic: Alert and oriented x 3.  Psych: Normal affect. Extremities: No clubbing or cyanosis.  HEENT: Tracheostomy  Telemetry: Sinus 70-80s  Personally reviewed  Labs: Basic Metabolic Panel: Recent Labs  Lab 08/03/19 1040 08/03/19 1542 08/03/19 2131 08/03/19 2222 08/04/19 1644 08/04/19 1651 08/07/19 0344 08/07/19 1600 08/08/19 0314 08/08/19 1630 08/08/19 1844 08/08/19 1904 08/08/19 2049 08/09/19 0453 08/09/19 0805  NA  --   --   --   --  136  --  143 144 144 143 145 144 144 142 143  K  --   --   --   --  4.7  --  4.4 4.1 3.8 4.7 4.7 4.6 4.5 4.0 3.8  CL  --    < >  --    < > 108   < > 113* 114* 112* 115*  --   --   --  111  --   CO2  --    < >  --    < > 24   < > _0 --   --   --  25  --   GLUCOSE  --    < >  --    < > 167*   < > 90 117* 113* 184*  --   --   --  137*  --   BUN  --    < >  --    < > 39*   < > 27* 25* 33* 37*  --   --   --  35*  --   CREATININE  --    < >  --    < >  0.74   < > 0.57* 0.66 0.64 0.57*  --   --   --  0.50*  --   CALCIUM  --    < >  --    < > 7.5*   < > 8.1* 8.5* 8.4* 8.1*  --   --   --  8.1*  --   MG 2.4  --   --   --  2.8*  --   --   --   --   --   --   --   --   --   --   PHOS 7.1*  --  4.2  --  2.7  --   --   --   --   --   --   --   --   --   --    < > = values in this interval not displayed.    Liver Function Tests: Recent Labs  Lab 08/05/19 0416 08/06/19 0457 08/07/19 0344 08/08/19 0314 08/09/19 0453  AST _0 62* 32  ALT _1 95* 92*  ALKPHOS 46 38 32* 45 43  BILITOT 1.0 0.8 0.9 0.9 1.0  PROT 4.5* 4.8* 4.8* 4.6* 4.5*  ALBUMIN 2.7* 2.8* 3.1* 3.2* 3.2*   No results for input(s): LIPASE, AMYLASE in the last 168 hours. No results for input(s): AMMONIA in the last 168 hours.  CBC: Recent Labs  Lab 08/07/19 2157 08/08/19 0314 08/08/19 1341 08/08/19 1630 08/08/19 1904 08/08/19 2049 08/09/19 0113 08/09/19 0453 08/09/19 0805  WBC 7.2 8.4  --  11.4*  --   --  10.5 10.4  --   NEUTROABS 4.7  --   --   --   --   --   --   --   --   HGB 8.1* 8.1* 9.3* 8.8* 8.2* 6.5* 8.2* 9.2* 8.5*  HCT 25.8* 25.1* 29.0* 27.6* 24.0* 19.0* 25.3* 28.0* 25.0*  MCV 90.5  89.3  --  92.0  --   --  90.0 90.0  --   PLT 55* 55* 49* 51*  --   --  48* 49*  --     Cardiac Enzymes: No results for input(s): CKTOTAL, CKMB, CKMBINDEX, TROPONINI in the last 168 hours.  BNP: BNP (last 3 results) Recent Labs    07/22/19 1039  BNP 15.3    ProBNP (last 3 results) No results for input(s): PROBNP in the last 8760 hours.    Other results:  Imaging: DG CHEST PORT 1 VIEW  Result Date: 08/08/2019 CLINICAL DATA:  ET tubes present, patient on ECMO with history of COVID EXAM: PORTABLE CHEST 1 VIEW COMPARISON:  08/07/2019 FINDINGS: Tracheostomy tube, left-sided central venous access device and bilateral chest tubes remain in place without change. Feeding tube courses through and off of the field of the radiograph. Cardiomediastinal contours largely obscured due to diffuse airspace disease in the chest. Dense opacification at the lung bases such that the left hemidiaphragm is completely obscured with partial obscure a shin of right hemidiaphragm. No visible pneumothorax. Visualized skeletal structures are unremarkable. IMPRESSION: 1. No significant change in the appearance of the chest since yesterday's study. 2. Stable support apparatus. 3. No visible pneumothorax. Electronically Signed   By: Zetta Bills M.D.   On: 08/08/2019 09:34   DG Abd Portable 1V  Result Date: 08/07/2019 CLINICAL DATA:  Feeding tube placement EXAM: PORTABLE ABDOMEN - 1 VIEW COMPARISON:  08/03/2019 FINDINGS: Feeding tube is in place with the tip in the  distal duodenum near the ligament of Treitz. Nonobstructive bowel gas pattern. IMPRESSION: Feeding tube tip in the distal duodenum. Electronically Signed   By: Rolm Baptise M.D.   On: 08/07/2019 12:12     Medications:     Scheduled Medications: . artificial tears  1 application Both Eyes K2I  . vitamin C  500 mg Per Tube Daily  . chlorhexidine gluconate (MEDLINE KIT)  15 mL Mouth Rinse BID  . chlorhexidine gluconate (MEDLINE KIT)  15 mL Mouth  Rinse BID  . Chlorhexidine Gluconate Cloth  6 each Topical Daily  . clonazePAM  2 mg Per Tube BID  . dexamethasone (DECADRON) injection  3 mg Intravenous Q24H   Followed by  . [START ON 08/13/2019] dexamethasone (DECADRON) injection  1 mg Intravenous Q24H  . feeding supplement (PRO-STAT SUGAR FREE 64)  30 mL Per Tube BID  . free water  400 mL Per Tube Q8H  . gabapentin  300 mg Per Tube Q8H  . insulin aspart  1 Units Subcutaneous Q4H  . insulin aspart  3-9 Units Subcutaneous Q4H  . magnesium hydroxide  15 mL Oral Daily  . mouth rinse  15 mL Mouth Rinse BID  . mouth rinse  15 mL Mouth Rinse 10 times per day  . oxyCODONE  10 mg Per Tube Q6H  . pantoprazole (PROTONIX) IV  40 mg Intravenous QHS  . QUEtiapine  25 mg Per Tube BID  . senna-docusate  1 tablet Per Tube BID  . sodium chloride flush  10-40 mL Intracatheter Q12H  . zinc sulfate  220 mg Per Tube Daily    Infusions: . sodium chloride    . sodium chloride Stopped (08/08/19 1922)  . cisatracurium (NIMBEX) infusion    . dextrose    . feeding supplement (PIVOT 1.5 CAL) 1,000 mL (08/08/19 1047)  . heparin 1,600 Units/hr (08/09/19 0800)  . HYDROmorphone 4 mg/hr (08/09/19 0800)  . midazolam (VERSED) infusion 14 mg/hr (08/09/19 0800)  . propofol (DIPRIVAN) infusion 40 mcg/kg/min (08/09/19 0800)    PRN Medications: Place/Maintain arterial line **AND** sodium chloride, sodium chloride, acetaminophen, dextrose, hydrALAZINE, HYDROmorphone, metoprolol tartrate, midazolam, ondansetron (ZOFRAN) IV, sodium chloride, sodium chloride flush   Assessment/Plan:    1. Acute hypoxic/hypercarbic respiratory failure due to COVID PNA: Remained markedly hypoxic despite full support. Intubated 12/26. S/p bilateral CTs 12/26 for pneumothoraces.  TEE on 12/26 LVEF 70% RV ok. VV ECMO begun 12/26. Day #7 VV ECMO. Tracheostomy 12/29.  COVID ARDS at this point.  Flows stable. Oxygen saturation stable, good color change between cannulas, suspect level of  mixing acceptable.  Plts lower but LDH also lower today and not suggestive of significant hemolysis. Weight up 9 lbs without Lasix yesterday. - Lasix 10 mg IV x 1 (aggressive response to Lasix) and will give albumin.  - Hold heparin gtt for 2 hours with trach site bleeding and will decrease rate by 200 U/hr.  2. Bilateral PTX: Due to barotrauma.  3. COVID PNA: management of COVID infection per CCM. CXR with bilateral multifocal PNA progressing to likely ARDS.  - s/p 10 days of remdesivir - 12/16, 12/2 s/p tociluzimab - 12/17 s/p convalescent plasma - On decadron - Off abx.   4. ID: Empiric coverage with vancomycin/cefepime for possible secondary bacterial PNA, course completed.   5. Thrombocytopenia: Slow decrease in platelets, likely low level hemolysis + critical illness.  6. Anemia: Blood loss at trach site.  2 units PRBCs overnight.  - Transfuse hgb < 8.  - Hold heparin  x 2 hrs this morning, with plts down to 48 K may benefit from unit plts.   Length of Stay: Story Performed by: Loralie Champagne  Total critical care time: 35 minutes  Critical care time was exclusive of separately billable procedures and treating other patients.  Critical care was necessary to treat or prevent imminent or life-threatening deterioration.  Critical care was time spent personally by me on the following activities: development of treatment plan with patient and/or surrogate as well as nursing, discussions with consultants, evaluation of patient's response to treatment, examination of patient, obtaining history from patient or surrogate, ordering and performing treatments and interventions, ordering and review of laboratory studies, ordering and review of radiographic studies, pulse oximetry and re-evaluation of patient's condition.    Loralie Champagne MD 08/09/2019, 8:55 AM  Advanced Heart Failure Team Pager [phone] (M-F; 7a - 4p)  Please contact [name] for night-coverage after hours  (4p -7a ) and weekends on [email or web address]

## 2019-08-10 ENCOUNTER — Inpatient Hospital Stay (HOSPITAL_COMMUNITY): Payer: Managed Care, Other (non HMO)

## 2019-08-10 DIAGNOSIS — Z7189 Other specified counseling: Secondary | ICD-10-CM

## 2019-08-10 DIAGNOSIS — J80 Acute respiratory distress syndrome: Secondary | ICD-10-CM

## 2019-08-10 DIAGNOSIS — Z515 Encounter for palliative care: Secondary | ICD-10-CM

## 2019-08-10 DIAGNOSIS — Z9281 Personal history of extracorporeal membrane oxygenation (ECMO): Secondary | ICD-10-CM

## 2019-08-10 LAB — POCT I-STAT 7, (LYTES, BLD GAS, ICA,H+H)
Acid-Base Excess: 2 mmol/L (ref 0.0–2.0)
Acid-Base Excess: 1 mmol/L (ref 0.0–2.0)
Acid-Base Excess: 3 mmol/L — ABNORMAL HIGH (ref 0.0–2.0)
Acid-Base Excess: 3 mmol/L — ABNORMAL HIGH (ref 0.0–2.0)
Bicarbonate: 25.6 mmol/L (ref 20.0–28.0)
Bicarbonate: 26.3 mmol/L (ref 20.0–28.0)
Bicarbonate: 27.3 mmol/L (ref 20.0–28.0)
Bicarbonate: 24.9 mmol/L (ref 20.0–28.0)
Bicarbonate: 27.6 mmol/L (ref 20.0–28.0)
Bicarbonate: 26.6 mmol/L (ref 20.0–28.0)
Calcium, Ion: 1.23 mmol/L (ref 1.15–1.40)
Calcium, Ion: 1.26 mmol/L (ref 1.15–1.40)
Calcium, Ion: 1.18 mmol/L (ref 1.15–1.40)
Calcium, Ion: 1.19 mmol/L (ref 1.15–1.40)
Calcium, Ion: 1.24 mmol/L (ref 1.15–1.40)
Calcium, Ion: 1.2 mmol/L (ref 1.15–1.40)
HCT: 24 % — ABNORMAL LOW (ref 39.0–52.0)
HCT: 26 % — ABNORMAL LOW (ref 39.0–52.0)
HCT: 25 % — ABNORMAL LOW (ref 39.0–52.0)
HCT: 24 % — ABNORMAL LOW (ref 39.0–52.0)
HCT: 23 % — ABNORMAL LOW (ref 39.0–52.0)
HCT: 21 % — ABNORMAL LOW (ref 39.0–52.0)
Hemoglobin: 8.2 g/dL — ABNORMAL LOW (ref 13.0–17.0)
Hemoglobin: 8.8 g/dL — ABNORMAL LOW (ref 13.0–17.0)
Hemoglobin: 8.5 g/dL — ABNORMAL LOW (ref 13.0–17.0)
Hemoglobin: 8.2 g/dL — ABNORMAL LOW (ref 13.0–17.0)
Hemoglobin: 7.8 g/dL — ABNORMAL LOW (ref 13.0–17.0)
Hemoglobin: 7.1 g/dL — ABNORMAL LOW (ref 13.0–17.0)
O2 Saturation: 93 %
O2 Saturation: 80 %
O2 Saturation: 88 %
O2 Saturation: 85 %
O2 Saturation: 94 %
O2 Saturation: 97 %
Patient temperature: 37.3
Patient temperature: 37.6
Patient temperature: 36.2
Patient temperature: 36.6
Patient temperature: 36.4
Patient temperature: 36.4
Potassium: 4.8 mmol/L (ref 3.5–5.1)
Potassium: 4.6 mmol/L (ref 3.5–5.1)
Potassium: 4.7 mmol/L (ref 3.5–5.1)
Potassium: 4.6 mmol/L (ref 3.5–5.1)
Potassium: 5 mmol/L (ref 3.5–5.1)
Potassium: 4.8 mmol/L (ref 3.5–5.1)
Sodium: 140 mmol/L (ref 135–145)
Sodium: 139 mmol/L (ref 135–145)
Sodium: 140 mmol/L (ref 135–145)
Sodium: 138 mmol/L (ref 135–145)
Sodium: 136 mmol/L (ref 135–145)
Sodium: 135 mmol/L (ref 135–145)
TCO2: 27 mmol/L (ref 22–32)
TCO2: 28 mmol/L (ref 22–32)
TCO2: 29 mmol/L (ref 22–32)
TCO2: 26 mmol/L (ref 22–32)
TCO2: 29 mmol/L (ref 22–32)
TCO2: 28 mmol/L (ref 22–32)
pCO2 arterial: 44.1 mmHg (ref 32.0–48.0)
pCO2 arterial: 51.1 mmHg — ABNORMAL HIGH (ref 32.0–48.0)
pCO2 arterial: 41.6 mmHg (ref 32.0–48.0)
pCO2 arterial: 36.6 mmHg (ref 32.0–48.0)
pCO2 arterial: 40.5 mmHg (ref 32.0–48.0)
pCO2 arterial: 34.9 mmHg (ref 32.0–48.0)
pH, Arterial: 7.374 (ref 7.350–7.450)
pH, Arterial: 7.322 — ABNORMAL LOW (ref 7.350–7.450)
pH, Arterial: 7.422 (ref 7.350–7.450)
pH, Arterial: 7.438 (ref 7.350–7.450)
pH, Arterial: 7.439 (ref 7.350–7.450)
pH, Arterial: 7.488 — ABNORMAL HIGH (ref 7.350–7.450)
pO2, Arterial: 70 mmHg — ABNORMAL LOW (ref 83.0–108.0)
pO2, Arterial: 50 mmHg — ABNORMAL LOW (ref 83.0–108.0)
pO2, Arterial: 52 mmHg — ABNORMAL LOW (ref 83.0–108.0)
pO2, Arterial: 47 mmHg — ABNORMAL LOW (ref 83.0–108.0)
pO2, Arterial: 67 mmHg — ABNORMAL LOW (ref 83.0–108.0)
pO2, Arterial: 76 mmHg — ABNORMAL LOW (ref 83.0–108.0)

## 2019-08-10 LAB — BPAM RBC
Blood Product Expiration Date: 202101242359
Blood Product Expiration Date: 202101242359
Blood Product Expiration Date: 202101252359
Blood Product Expiration Date: 202101252359
Blood Product Expiration Date: 202101292359
Blood Product Expiration Date: 202101292359
ISSUE DATE / TIME: 202012310822
ISSUE DATE / TIME: 202012310822
ISSUE DATE / TIME: 202012312151
ISSUE DATE / TIME: 202101010132
ISSUE DATE / TIME: 202101012226
ISSUE DATE / TIME: 202101012226
Unit Type and Rh: 5100
Unit Type and Rh: 5100
Unit Type and Rh: 5100
Unit Type and Rh: 5100
Unit Type and Rh: 5100
Unit Type and Rh: 5100

## 2019-08-10 LAB — BASIC METABOLIC PANEL
Anion gap: 6 (ref 5–15)
Anion gap: 7 (ref 5–15)
BUN: 35 mg/dL — ABNORMAL HIGH (ref 6–20)
BUN: 32 mg/dL — ABNORMAL HIGH (ref 6–20)
CO2: 26 mmol/L (ref 22–32)
CO2: 26 mmol/L (ref 22–32)
Calcium: 8.3 mg/dL — ABNORMAL LOW (ref 8.9–10.3)
Calcium: 8.3 mg/dL — ABNORMAL LOW (ref 8.9–10.3)
Chloride: 108 mmol/L (ref 98–111)
Chloride: 104 mmol/L (ref 98–111)
Creatinine, Ser: 0.49 mg/dL — ABNORMAL LOW (ref 0.61–1.24)
Creatinine, Ser: 0.43 mg/dL — ABNORMAL LOW (ref 0.61–1.24)
GFR calc Af Amer: >60 mL/min (ref >60)
GFR calc Af Amer: >60 mL/min (ref >60)
GFR calc non Af Amer: >60 mL/min (ref >60)
GFR calc non Af Amer: >60 mL/min (ref >60)
Glucose, Bld: 161 mg/dL — ABNORMAL HIGH (ref 70–99)
Glucose, Bld: 179 mg/dL — ABNORMAL HIGH (ref 70–99)
Potassium: 4.7 mmol/L (ref 3.5–5.1)
Potassium: 5.1 mmol/L (ref 3.5–5.1)
Sodium: 140 mmol/L (ref 135–145)
Sodium: 137 mmol/L (ref 135–145)

## 2019-08-10 LAB — COOXEMETRY PANEL
Carboxyhemoglobin: 2.9 % — ABNORMAL HIGH (ref 0.5–1.5)
Methemoglobin: 1.4 % (ref 0.0–1.5)
O2 Saturation: 95 %
Total hemoglobin: 9.6 g/dL — ABNORMAL LOW (ref 12.0–16.0)

## 2019-08-10 LAB — CBC
HCT: 28.6 % — ABNORMAL LOW (ref 39.0–52.0)
HCT: 25.7 % — ABNORMAL LOW (ref 39.0–52.0)
HCT: 24.8 % — ABNORMAL LOW (ref 39.0–52.0)
Hemoglobin: 9.4 g/dL — ABNORMAL LOW (ref 13.0–17.0)
Hemoglobin: 8.4 g/dL — ABNORMAL LOW (ref 13.0–17.0)
Hemoglobin: 8.1 g/dL — ABNORMAL LOW (ref 13.0–17.0)
MCH: 30 pg (ref 26.0–34.0)
MCH: 30.4 pg (ref 26.0–34.0)
MCH: 29.9 pg (ref 26.0–34.0)
MCHC: 32.9 g/dL (ref 30.0–36.0)
MCHC: 32.7 g/dL (ref 30.0–36.0)
MCHC: 32.7 g/dL (ref 30.0–36.0)
MCV: 91.4 fL (ref 80.0–100.0)
MCV: 93.1 fL (ref 80.0–100.0)
MCV: 91.5 fL (ref 80.0–100.0)
Platelets: 54 10*3/uL — ABNORMAL LOW (ref 150–400)
Platelets: 47 10*3/uL — ABNORMAL LOW (ref 150–400)
Platelets: 47 10*3/uL — ABNORMAL LOW (ref 150–400)
RBC: 3.13 MIL/uL — ABNORMAL LOW (ref 4.22–5.81)
RBC: 2.76 MIL/uL — ABNORMAL LOW (ref 4.22–5.81)
RBC: 2.71 MIL/uL — ABNORMAL LOW (ref 4.22–5.81)
RDW: 15.9 % — ABNORMAL HIGH (ref 11.5–15.5)
RDW: 16.1 % — ABNORMAL HIGH (ref 11.5–15.5)
RDW: 16.3 % — ABNORMAL HIGH (ref 11.5–15.5)
WBC: 10.3 10*3/uL (ref 4.0–10.5)
WBC: 9.4 10*3/uL (ref 4.0–10.5)
WBC: 9 10*3/uL (ref 4.0–10.5)
nRBC: 2.7 % — ABNORMAL HIGH (ref 0.0–0.2)
nRBC: 1.9 % — ABNORMAL HIGH (ref 0.0–0.2)
nRBC: 2 % — ABNORMAL HIGH (ref 0.0–0.2)

## 2019-08-10 LAB — BPAM PLATELET PHERESIS
Blood Product Expiration Date: 202101032359
ISSUE DATE / TIME: 202101011115
Unit Type and Rh: 600

## 2019-08-10 LAB — TYPE AND SCREEN
ABO/RH(D): O POS
Antibody Screen: NEGATIVE
Unit division: 0
Unit division: 0
Unit division: 0
Unit division: 0
Unit division: 0
Unit division: 0

## 2019-08-10 LAB — FIBRINOGEN: Fibrinogen: 146 mg/dL — ABNORMAL LOW (ref 210–475)

## 2019-08-10 LAB — CULTURE, BLOOD (ROUTINE X 2)
Culture: NO GROWTH
Culture: NO GROWTH
Special Requests: ADEQUATE
Special Requests: ADEQUATE

## 2019-08-10 LAB — HEPATIC FUNCTION PANEL
ALT: 67 U/L — ABNORMAL HIGH (ref 0–44)
AST: 22 U/L (ref 15–41)
Albumin: 3.3 g/dL — ABNORMAL LOW (ref 3.5–5.0)
Alkaline Phosphatase: 36 U/L — ABNORMAL LOW (ref 38–126)
Bilirubin, Direct: 0.2 mg/dL (ref 0.0–0.2)
Indirect Bilirubin: 0.6 mg/dL (ref 0.3–0.9)
Total Bilirubin: 0.8 mg/dL (ref 0.3–1.2)
Total Protein: 4.7 g/dL — ABNORMAL LOW (ref 6.5–8.1)

## 2019-08-10 LAB — MAGNESIUM: Magnesium: 2.1 mg/dL (ref 1.7–2.4)

## 2019-08-10 LAB — PREPARE RBC (CROSSMATCH)

## 2019-08-10 LAB — HEPARIN LEVEL (UNFRACTIONATED)
Heparin Unfractionated: 0.4 IU/mL (ref 0.30–0.70)
Heparin Unfractionated: 0.16 IU/mL — ABNORMAL LOW (ref 0.30–0.70)

## 2019-08-10 LAB — GLUCOSE, CAPILLARY
Glucose-Capillary: 148 mg/dL — ABNORMAL HIGH (ref 70–99)
Glucose-Capillary: 149 mg/dL — ABNORMAL HIGH (ref 70–99)
Glucose-Capillary: 153 mg/dL — ABNORMAL HIGH (ref 70–99)
Glucose-Capillary: 158 mg/dL — ABNORMAL HIGH (ref 70–99)
Glucose-Capillary: 160 mg/dL — ABNORMAL HIGH (ref 70–99)
Glucose-Capillary: 178 mg/dL — ABNORMAL HIGH (ref 70–99)
Glucose-Capillary: 183 mg/dL — ABNORMAL HIGH (ref 70–99)

## 2019-08-10 LAB — TRIGLYCERIDES: Triglycerides: 216 mg/dL — ABNORMAL HIGH (ref <150)

## 2019-08-10 LAB — APTT
aPTT: 73 seconds — ABNORMAL HIGH (ref 24–36)
aPTT: 60 seconds — ABNORMAL HIGH (ref 24–36)

## 2019-08-10 LAB — PROTIME-INR
INR: 1.3 — ABNORMAL HIGH (ref 0.8–1.2)
Prothrombin Time: 15.8 seconds — ABNORMAL HIGH (ref 11.4–15.2)

## 2019-08-10 LAB — PREPARE PLATELET PHERESIS: Unit division: 0

## 2019-08-10 LAB — LACTIC ACID, PLASMA: Lactic Acid, Venous: 1.8 mmol/L (ref 0.5–1.9)

## 2019-08-10 LAB — PHOSPHORUS: Phosphorus: 3.4 mg/dL (ref 2.5–4.6)

## 2019-08-10 LAB — LACTATE DEHYDROGENASE: LDH: 298 U/L — ABNORMAL HIGH (ref 98–192)

## 2019-08-10 LAB — TECHNOLOGIST SMEAR REVIEW

## 2019-08-10 MED ORDER — HEPARIN (PORCINE) 25000 UT/250ML-% IV SOLN
1450.0000 [IU]/h | INTRAVENOUS | Status: DC
  Filled 2019-08-10 (×2): qty 250

## 2019-08-10 MED ORDER — ALBUMIN HUMAN 5 % IV SOLN
INTRAVENOUS | Status: AC
  Administered 2019-08-11: 25 g via INTRAVENOUS
  Filled 2019-08-10: qty 250

## 2019-08-10 MED ORDER — ALBUMIN HUMAN 5 % IV SOLN
INTRAVENOUS | Status: AC
  Filled 2019-08-10: qty 250

## 2019-08-10 MED ORDER — SODIUM CHLORIDE 0.9% IV SOLUTION: Freq: Once | INTRAVENOUS | Status: AC

## 2019-08-10 MED ORDER — ETOMIDATE 2 MG/ML IV SOLN
20.0000 mg | Freq: Once | INTRAVENOUS | Status: AC
  Administered 2019-08-10: 12:00:00 20 mg via INTRAVENOUS

## 2019-08-10 MED ORDER — SUCCINYLCHOLINE CHLORIDE 20 MG/ML IJ SOLN
100.0000 mg | Freq: Once | INTRAMUSCULAR | Status: AC
  Administered 2019-08-10: 100 mg via INTRAVENOUS

## 2019-08-10 MED ORDER — "THROMBI-PAD 3""X3"" EX PADS"
1.0000 | MEDICATED_PAD | Freq: Once | CUTANEOUS | Status: AC
  Administered 2019-08-11: 1 via TOPICAL
  Filled 2019-08-10: qty 1

## 2019-08-10 MED ORDER — "THROMBI-PAD 3""X3"" EX PADS"
1.0000 | MEDICATED_PAD | CUTANEOUS | Status: AC
  Administered 2019-08-10: 1 via TOPICAL
  Filled 2019-08-10: qty 1

## 2019-08-10 NOTE — Progress Notes (Signed)
Daily Progress Note   Patient Name: [name]       Date: 08/10/2019 DOB: 1976-06-22  Age: 44 y.o. MRN#: [id number] Attending Physician: Wonda Olds, MD Primary Care Physician: Tamsen Roers, MD Admit Date: 07/22/2019  Reason for Consultation/Follow-up: Establishing goals of care  Subjective: Noted patient required bronch today- low O2 on ABG this morning.  Received list of questions from patient's sister. Plan to meet tomorrow to discuss.  ROS  Length of Stay: 19  Current Medications: Scheduled Meds:  . artificial tears  1 application Both Eyes B7C  . vitamin C  500 mg Per Tube Daily  . chlorhexidine gluconate (MEDLINE KIT)  15 mL Mouth Rinse BID  . Chlorhexidine Gluconate Cloth  6 each Topical Daily  . clonazePAM  2 mg Per Tube BID  . dexamethasone (DECADRON) injection  3 mg Intravenous Q24H   Followed by  . [START ON 08/13/2019] dexamethasone (DECADRON) injection  1 mg Intravenous Q24H  . feeding supplement (PRO-STAT SUGAR FREE 64)  30 mL Per Tube BID  . free water  400 mL Per Tube Q8H  . gabapentin  300 mg Per Tube Q8H  . insulin aspart  1 Units Subcutaneous Q4H  . insulin aspart  3-9 Units Subcutaneous Q4H  . magnesium hydroxide  15 mL Oral Daily  . mouth rinse  15 mL Mouth Rinse 10 times per day  . oxyCODONE  10 mg Per Tube Q6H  . pantoprazole (PROTONIX) IV  40 mg Intravenous QHS  . QUEtiapine  25 mg Per Tube BID  . senna-docusate  1 tablet Per Tube BID  . sodium chloride flush  10-40 mL Intracatheter Q12H  . Thrombi-Pad  1 each Topical Once  . Thrombi-Pad  1 each Topical STAT  . zinc sulfate  220 mg Per Tube Daily    Continuous Infusions: . sodium chloride    . sodium chloride Stopped (08/08/19 1922)  . albumin human    . cisatracurium (NIMBEX) infusion     . dextrose    . feeding supplement (PIVOT 1.5 CAL) 1,000 mL (08/10/19 1452)  . heparin 1,400 Units/hr (08/10/19 1200)  . HYDROmorphone 2 mg/hr (08/10/19 1000)  . midazolam (VERSED) infusion 16 mg/hr (08/10/19 1000)  . propofol (DIPRIVAN) infusion 60 mcg/kg/min (08/10/19 1224)    PRN Meds: Place/Maintain arterial line **  AND** sodium chloride, sodium chloride, acetaminophen, dextrose, hydrALAZINE, HYDROmorphone, labetalol, metoprolol tartrate, midazolam, ondansetron (ZOFRAN) IV, sodium chloride, sodium chloride flush  Physical Exam          Vital Signs: BP (!) 153/43   Pulse 64   Temp 98.1 F (36.7 C) (Core)   Resp (!) 0   Ht 5' 8"  (1.727 m)   Wt 129.7 kg   SpO2 95%   BMI 43.48 kg/m  SpO2: SpO2: 95 % O2 Device: O2 Device: Ventilator O2 Flow Rate: O2 Flow Rate (L/min): 45 L/min(45L 15L NRB)  Intake/output summary:   Intake/Output Summary (Last 24 hours) at 08/10/2019 1702 Last data filed at 08/10/2019 1300 Gross per 24 hour  Intake 6780.25 ml  Output 2400 ml  Net 4380.25 ml   LBM: Last BM Date: 08/10/19 Baseline Weight: Weight: 136.1 kg Most recent weight: Weight: 129.7 kg       Palliative Assessment/Data:      Patient Active Problem List   Diagnosis Date Noted  . Palliative care by specialist   . Goals of care, counseling/discussion   . Advanced directives, counseling/discussion   . Subcutaneous crepitus   . Pneumonia due to COVID-19 virus 07/23/2019  . Acute respiratory failure with hypoxia ([place]) 07/22/2019  . Persistent asthma with undetermined severity   . Thrombocytopenia ([place])   . Leukopenia   . Fever   . Gram positive bacterial infection   . Septic arthritis of right acromioclavicular joint ([place]) 09/29/2017  . Chronic left-sided low back pain with left-sided sciatica 09/19/2017  . Epigastric pain   . Pancreatitis 04/17/2017  . Essential hypertension 04/17/2017  . Moderate persistent asthma 07/21/2015  . Allergic rhinoconjunctivitis 07/21/2015  .  GERD (gastroesophageal reflux disease) 07/21/2015  . ABNORMALITY OF GAIT 11/12/2009  . TARSAL TUNNEL SYNDROME, LEFT 10/08/2009  . PES PLANUS 10/08/2009    Palliative Care Assessment & Plan   Patient Profile:  44 y/o M admitted 12/14 with COVIDpneumonia causing acutehypoxemic respiratory failure. Developed pneumomediastinum 12/23 with concern for possible bilateral small pneumothoraces on 12/24. PCCM consulted for evaluation of pneumothoraces. During admission patient has had placement of bilateral chest tubes, and is now on VV ECMO, and vent by trach. Recovery has been complicated by ongoing bleeding- frank blood in chest tubes, bleeding from around trach site, epistaxis- has received multiple blood transfusions, platelet transfusion. Also received albumin for volume expansion due to low flow after receiving diuresis- diuresis now on hold.  Palliative medicine consulted for support and GOC.   Assessment/Recommendations/Plan  Chart reviewed and plan of care discussed with patient's perfusion specialists at bedside Plan to meet with [name] and Bedelia Person tomorrow at 2pm for [place] discussion- will have other family members via [place] and Additional Recommendations: Limitations on Scope of Treatment: Full Scope Treatment  Code Status: DNR  Prognosis:  Unable to determine  Discharge Planning: To Be Determined   Care plan was discussed with patient's spouse [name] and sister April.  Thank you for allowing the Palliative Medicine Team to assist in the care of this patient.   Time In: 1600 Time Out: 1700 Total Time 60 mins Prolonged Time Billed yes      Greater than 50%  of this time was spent counseling and coordinating care related to the above assessment and plan.  Mariana Kaufman, AGNP-C Palliative Medicine   Please contact Palliative Medicine Team phone at [phone] for questions and concerns.

## 2019-08-10 NOTE — Progress Notes (Addendum)
ANTICOAGULATION CONSULT NOTE  Pharmacy Consult for heparin Indication: ECMO  No Known Allergies  Patient Measurements: Height: 5' 8"  (172.7 cm) Weight: 285 lb 15 oz (129.7 kg) IBW/kg (Calculated) : 68.4 Heparin Dosing Weight: 100.7 kg  Vital Signs: Temp: 98.1 F (36.7 C) (01/02 1200) Temp Source: Core (01/02 1200) BP: 153/43 (01/02 1200) Pulse Rate: 64 (01/02 1200)  Labs: Recent Labs    08/08/19 0314 08/08/19 0811 08/09/19 0453 08/09/19 1635 08/10/19 0412 08/10/19 1319 08/10/19 1700 08/10/19 1704 08/10/19 1712  HGB 8.1*  --  9.2* 8.8* 9.4* 8.4* 8.1*  --  7.8*  HCT 25.1*  --  28.0* 27.3* 28.6* 25.7* 24.8*  --  23.0*  PLT 55*   < > 49* 61* 54* 47* 47*  --   --   APTT 86*   < > 98* 61* 73*  --   --   --   --   LABPROT 17.0*  --  15.9*  --  15.8*  --   --   --   --   INR 1.4*  --  1.3*  --  1.3*  --   --   --   --   HEPARINUNFRC  --    < > 0.55 0.28* 0.40  --   --  0.16*  --   CREATININE 0.64   < > 0.50* 0.52* 0.49*  --  0.43*  --   --    < > = values in this interval not displayed.    Estimated Creatinine Clearance: 153.2 mL/min (A) (by C-G formula based on SCr of 0.43 mg/dL (L)).   Medical History: Past Medical History:  Diagnosis Date  . Asthma   . GERD (gastroesophageal reflux disease)   . Headache   . History of kidney stones    LEFT URETERAL STONE  . HTN (hypertension)   . Pancreatitis 2018   GALLBALDDER SLUDGE CAUSED ISSUED RESOLVED    Medications:  Scheduled:  . artificial tears  1 application Both Eyes P1W  . vitamin C  500 mg Per Tube Daily  . chlorhexidine gluconate (MEDLINE KIT)  15 mL Mouth Rinse BID  . Chlorhexidine Gluconate Cloth  6 each Topical Daily  . clonazePAM  2 mg Per Tube BID  . dexamethasone (DECADRON) injection  3 mg Intravenous Q24H   Followed by  . [START ON 08/13/2019] dexamethasone (DECADRON) injection  1 mg Intravenous Q24H  . feeding supplement (PRO-STAT SUGAR FREE 64)  30 mL Per Tube BID  . free water  400 mL Per Tube Q8H   . gabapentin  300 mg Per Tube Q8H  . insulin aspart  1 Units Subcutaneous Q4H  . insulin aspart  3-9 Units Subcutaneous Q4H  . magnesium hydroxide  15 mL Oral Daily  . mouth rinse  15 mL Mouth Rinse 10 times per day  . oxyCODONE  10 mg Per Tube Q6H  . pantoprazole (PROTONIX) IV  40 mg Intravenous QHS  . QUEtiapine  25 mg Per Tube BID  . senna-docusate  1 tablet Per Tube BID  . sodium chloride flush  10-40 mL Intracatheter Q12H  . Thrombi-Pad  1 each Topical Once  . Thrombi-Pad  1 each Topical STAT  . zinc sulfate  220 mg Per Tube Daily    Assessment: 60 yom known COVID+, has been declining with ARDS for past 48 hours requiring intubation and max vent settings. Underwent VV ECMO cannulation on 12/26 and now continues on IV heparin.  Heparin was held early this  morning due to increased bleeding overnight. Orders for FFP, platelets, and PRBC. Per Dr. [name], ok to restart heparin gtt at noon and target lower heparin goal 0.3-0.4 with increased bleeding. -heparin level= 0.16 (heparin rate 1400 units/hr) -aPTT= 60 (goal 66-75) -hg= 7.8 (trend down)    Goal of Therapy:  Heparin level 0.3-0.4 units/ml >> targeting lower goal with bleed Monitor platelets by anticoagulation protocol: Yes   Plan: -increase heparin to 1450 units/hr -heparin level and CBC in am  Hildred Laser, PharmD Clinical Pharmacist **Pharmacist phone directory can now be found on [email or web address] ([email or web address]).  Listed under [place].

## 2019-08-10 NOTE — Progress Notes (Addendum)
NAME:  [name], MRN:  [id number], DOB:  May 04, 1976, LOS: 62 ADMISSION DATE:  07/22/2019, CONSULTATION DATE:  12/24 REFERRING MD:  Sloan Leiter, CHIEF COMPLAINT:  Dyspnea   Brief History   44 y/o M admitted 12/14 with COVID pneumonia causing acute hypoxemic respiratory failure. Developed pneumomediastinum 12/23 with concern for possible bilateral small pneumothoraces on 12/24.  PCCM consulted for evaluation of pneumothoraces  Past Medical History  GERD HTN Asthma  Significant Hospital Events   12/14 admit with hypoxemic respiratory failure in setting of COVID-19 pneumonia 12/23 noted to have pneumomediastinum on chest x-ray 12/24 PCCM consulted for evaluation 12/26 worsening hypoxemia, intubated  12/27 VV fem/fem ECMO cannulation 12/28 ETT switch 12/29 Tracheostomy 12/31 Oozing from multiple sites, H/H drop. Heparin held briefly and lidocaine with epi injected around trach site, Cryo, DDVAP given  Consults:  PCCM  Procedures:    Significant Diagnostic Tests:  CT chest 12/22 >> extensive pneumomediastinum, multifocal patchy bilateral groundglass opacitiesCT chest 12/22 >> extensive pneumomediastinum, multifocal patchy bilateral groundglass opacities  Micro Data:  BCx2 12/14 >> negative  Sputum 12/15 >> negative   Antimicrobials:  Received remdesivir, steroids, actemra and convalescent plasma  5 days cefepime 12/24-12/29   Interim history/subjective:  Remains sedated on VV ecmo Continue to ooze from chest tube and trach site Heparin held today Has received additional FFP, platelets, PRBCs and platelets  Objective   Blood pressure (!) 162/60, pulse 63, temperature (!) 97.3 F (36.3 C), temperature source Core, resp. rate 12, height 5' 8"  (1.727 m), weight 129.7 kg, SpO2 95 %. CVP:  [9 mmHg-23 mmHg] 14 mmHg  Vent Mode: PCV FiO2 (%):  [50 %] 50 % Set Rate:  [20 bmp] 20 bmp PEEP:  [10 cmH20] 10 cmH20 Plateau Pressure:  [21 cmH20-29 cmH20] 21 cmH20   Intake/Output  Summary (Last 24 hours) at 08/10/2019 1052 Last data filed at 08/10/2019 1000 Gross per 24 hour  Intake 8081.54 ml  Output 2285 ml  Net 5796.54 ml   Filed Weights   08/07/19 0421 08/08/19 0300 08/09/19 0500  Weight: 122 kg 125.4 kg 129.7 kg   Examination Gen:      No acute distress HEENT:  EOMI, sclera anicteric, ETT tube Neck:     No masses; no thyromegaly Lungs:    Clear to auscultation bilaterally; normal respiratory effort CV:         Regular rate and rhythm; no murmurs Abd:      + bowel sounds; soft, non-tender; no palpable masses, no distension Ext:    No edema; adequate peripheral perfusion Skin:      Warm and dry; no rash Neuro: Sedated, unresponsive  Resolved Hospital Problem list     Assessment & Plan:  # Acute Hypoxemic Respiratory Failure secondary to COVID- finished remdesivir, got actemra, activated plasma, on steroids.  Inability to ventilate/oxygenate leading to VV ECMO cannulation  # Bilateral pneumothoraces and pneumomediastinum Adjusted FiO2 on vent to 100%.  Continue following lactic acid, ABG Will do bronchoscopy today to evaluate for blood clots, mucus plugs Continue sedation with Dilaudid, Versed, propofol. Follow triglycerides  Coagulopathy, blood loss anemia S/p cryo, DDAVP, PRBCs Heparin on hold till noon.  Monitor CBC  Hyperglycemia Continue SSI coverage  Best practice:   Feeding: TF, FWF Analgesia: see above Sedation: see above Thromboprophylaxis: heparin gtt on hold Ulcer prophylaxis: pantoprazole Glucose control: SSI Family: Wife called 1/2  Labs   CBC: Recent Labs  Lab 08/07/19 2157 08/08/19 0302 08/08/19 1630 08/09/19 0113 08/09/19 4742 08/09/19 1632  08/09/19 1635 08/09/19 2036 08/10/19 0412 08/10/19 0615  WBC 7.2   < > 11.4* 10.5 10.4  --  10.6*  --  10.3  --   NEUTROABS 4.7  --   --   --   --   --   --   --   --   --   HGB 8.1*  --  8.8* 8.2* 9.2* 8.5* 8.8* 8.2* 9.4* 8.8*  HCT 25.8*  --  27.6* 25.3* 28.0* 25.0* 27.3*  24.0* 28.6* 26.0*  MCV 90.5   < > 92.0 90.0 90.0  --  91.9  --  91.4  --   PLT 55*   < > 51* 48* 49*  --  61*  --  54*  --    < > = values in this interval not displayed.    Basic Metabolic Panel: Recent Labs  Lab 08/03/19 2131 08/03/19 2222 08/04/19 1644 08/04/19 1651 08/08/19 0314 08/08/19 1630 08/09/19 0453 08/09/19 1632 08/09/19 1635 08/09/19 2036 08/10/19 0412 08/10/19 0615  NA  --   --  136  --  144 143 142 141 138 140 140 139  K  --   --  4.7  --  3.8 4.7 4.0 4.5 4.5 4.8 4.7 4.6  CL  --    < > 108   < > 112* 115* 111  --  107  --  108  --   CO2  --    < > 24   < > 24 23 25   --  24  --  26  --   GLUCOSE  --    < > 167*   < > 113* 184* 137*  --  180*  --  161*  --   BUN  --    < > 39*   < > 33* 37* 35*  --  34*  --  35*  --   CREATININE  --    < > 0.74   < > 0.64 0.57* 0.50*  --  0.52*  --  0.49*  --   CALCIUM  --    < > 7.5*   < > 8.4* 8.1* 8.1*  --  8.2*  --  8.3*  --   MG  --   --  2.8*  --   --   --   --   --   --   --  2.1  --   PHOS 4.2  --  2.7  --   --   --   --   --   --   --  3.4  --    < > = values in this interval not displayed.   GFR: Estimated Creatinine Clearance: 153.2 mL/min (A) (by C-G formula based on SCr of 0.49 mg/dL (L)). Recent Labs  Lab 08/05/19 1104 08/05/19 1622 08/07/19 0831 08/08/19 0313 08/09/19 0113 08/09/19 0453 08/09/19 1635 08/10/19 0412  PROCALCITON <0.10  --   --   --   --   --   --   --   WBC  --   --   --   --  10.5 10.4 10.6* 10.3  LATICACIDVEN  --    < > 1.0 1.5  --  1.7  --  1.8   < > = values in this interval not displayed.    Liver Function Tests: Recent Labs  Lab 08/06/19 0457 08/07/19 0344 08/08/19 0314 08/09/19 0453 08/10/19 0412  AST 19 18 62* 32 22  ALT  17 19 95* 92* 67*  ALKPHOS 38 32* 45 43 36*  BILITOT 0.8 0.9 0.9 1.0 0.8  PROT 4.8* 4.8* 4.6* 4.5* 4.7*  ALBUMIN 2.8* 3.1* 3.2* 3.2* 3.3*   No results for input(s): LIPASE, AMYLASE in the last 168 hours. No results for input(s): AMMONIA in the last  168 hours.  ABG    Component Value Date/Time   PHART 7.322 (L) 08/10/2019 0615   PCO2ART 51.1 (H) 08/10/2019 0615   PO2ART 50.0 (L) 08/10/2019 0615   HCO3 26.3 08/10/2019 0615   TCO2 28 08/10/2019 0615   ACIDBASEDEF 1.0 08/08/2019 2049   O2SAT 80.0 08/10/2019 0615     Coagulation Profile: Recent Labs  Lab 08/06/19 0457 08/07/19 0344 08/08/19 0314 08/09/19 0453 08/10/19 0412  INR 1.3* 1.4* 1.4* 1.3* 1.3*    Cardiac Enzymes: No results for input(s): CKTOTAL, CKMB, CKMBINDEX, TROPONINI in the last 168 hours.  HbA1C: Hgb A1c MFr Bld  Date/Time Value Ref Range Status  07/24/2019 04:50 AM 6.7 (H) 4.8 - 5.6 % Final    Comment:    (NOTE) Pre diabetes:          5.7%-6.4% Diabetes:              >6.4% Glycemic control for   <7.0% adults with diabetes   09/30/2017 05:36 AM 5.7 (H) 4.8 - 5.6 % Final    Comment:    (NOTE)         Prediabetes: 5.7 - 6.4         Diabetes: >6.4         Glycemic control for adults with diabetes: <7.0     CBG: Recent Labs  Lab 08/09/19 1549 08/09/19 1956 08/10/19 0110 08/10/19 0414 08/10/19 0807  GLUCAP 181* 169* 148* 149* 158*     Critical care time:    The patient is critically ill with multiple organ system failure and requires high complexity decision making for assessment and support, frequent evaluation and titration of therapies, advanced monitoring, review of radiographic studies and interpretation of complex data.   Critical Care Time devoted to patient care services, exclusive of separately billable procedures, described in this note is 35 minutes.   [name] MD [facility] Please see [email or web address] for pager details.  08/10/2019, 10:52 AM

## 2019-08-10 NOTE — Progress Notes (Signed)
PCCM progress:   Oozing around R chest tube overnight, paged to evaluate.  Slow ooze noted, surgicel placed with improvement. Continue to trend hgb.

## 2019-08-10 NOTE — Progress Notes (Signed)
Dr. [name] notified of bleeding issues and morning labs, given orders to stop heparin gtt and give 1u pRBC, 2u FFP, and 1u platelet. In interim, given 1L LR bolus and 1 album 250cc to maintain volume necessary for ECMO flows.

## 2019-08-10 NOTE — Progress Notes (Signed)
Patient ID: [name], male   DOB: 01-26-1976, 44 y.o.   MRN: [id number]    Advanced Heart Failure Rounding Note   Subjective:    Remains on VV ecmo with good flows and oxygen saturation 94%. No pressors, stable to high SBP but drops flow significantly if he gets Lasix. On Dilaudid, Propofol, Versed for sedation. S/p tracheostomy with bleeding from trach site, also profuse bleeding from chest tube sites and central line site.  He got 2 units FFP, 3 PRBCs, and is in the process of getting unit platelets.  Heparin held and bleeding has slowed. Lactate 1.8.   CXR: Bilateral lung opacities c/w ARDS  I/Os positive and weight up, got Lasix 10 mg IV x 1 yesterday.   Platelets 76=>71=>62=>51=>48 => 54, LDH 562=>538=>444=>394 => 316 => 298.     Afebrile, off abx.   On vent 50% PEEP 10. Bilateral CTs in for PTXs  ECMO parameters Flow 4.9 RPM 3400 dP 16 Pressure -90   Objective:   Weight Range:  Vital Signs:   Temp:  [97.7 F (36.5 C)-99.7 F (37.6 C)] 98.2 F (36.8 C) (01/02 0715) Pulse Rate:  [53-90] 68 (01/02 0715) Resp:  [0-20] 11 (01/02 0715) BP: (110-182)/(44-62) 162/60 (01/02 0715) SpO2:  [80 %-100 %] 94 % (01/02 0715) Arterial Line BP: (110-201)/(35-71) 162/60 (01/02 0715) FiO2 (%):  [50 %] 50 % (01/02 0400) Last BM Date: 08/10/19  Weight change: Filed Weights   08/07/19 0421 08/08/19 0300 08/09/19 0500  Weight: 122 kg 125.4 kg 129.7 kg    Intake/Output:   Intake/Output Summary (Last 24 hours) at 08/10/2019 0834 Last data filed at 08/10/2019 0700 Gross per 24 hour  Intake 7840.6 ml  Output 2495 ml  Net 5345.6 ml     Physical Exam: General: Sedated on vent Neck: Thick, JVP difficult, no thyromegaly or thyroid nodule.  Lungs: Decreased at bases.  CV: Nonpalpable PMI.  Heart regular S1/S2, no S3/S4, no murmur.  2+ lower leg edema.   Abdomen: Soft, nontender, no hepatosplenomegaly, mild distention.  Skin: Intact without lesions or rashes.  Neurologic:  Sedated Extremities: No clubbing or cyanosis.  HEENT: Trach with mild oozing  Telemetry: Sinus 70-80s Personally reviewed  Labs: Basic Metabolic Panel: Recent Labs  Lab 08/03/19 1040 08/03/19 1542 08/03/19 2131 08/03/19 2222 08/04/19 1644 08/04/19 1651 08/08/19 0314 08/08/19 1630 08/09/19 0453 08/09/19 1632 08/09/19 1635 08/09/19 2036 08/10/19 0412 08/10/19 0615  NA  --   --   --   --  136  --  144 143 142 141 138 140 140 139  K  --   --   --   --  4.7  --  3.8 4.7 4.0 4.5 4.5 4.8 4.7 4.6  CL  --    < >  --    < > 108   < > 112* 115* 111  --  107  --  108  --   CO2  --    < >  --    < > 24   < > 24 23 25   --  24  --  26  --   GLUCOSE  --    < >  --    < > 167*   < > 113* 184* 137*  --  180*  --  161*  --   BUN  --    < >  --    < > 39*   < > 33* 37* 35*  --  34*  --  35*  --   CREATININE  --    < >  --    < > 0.74   < > 0.64 0.57* 0.50*  --  0.52*  --  0.49*  --   CALCIUM  --    < >  --    < > 7.5*   < > 8.4* 8.1* 8.1*  --  8.2*  --  8.3*  --   MG 2.4  --   --   --  2.8*  --   --   --   --   --   --   --  2.1  --   PHOS 7.1*  --  4.2  --  2.7  --   --   --   --   --   --   --  3.4  --    < > = values in this interval not displayed.    Liver Function Tests: Recent Labs  Lab 08/06/19 0457 08/07/19 0344 08/08/19 0314 08/09/19 0453 08/10/19 0412  AST 19 18 62* 32 22  ALT 17 19 95* 92* 67*  ALKPHOS 38 32* 45 43 36*  BILITOT 0.8 0.9 0.9 1.0 0.8  PROT 4.8* 4.8* 4.6* 4.5* 4.7*  ALBUMIN 2.8* 3.1* 3.2* 3.2* 3.3*   No results for input(s): LIPASE, AMYLASE in the last 168 hours. No results for input(s): AMMONIA in the last 168 hours.  CBC: Recent Labs  Lab 08/07/19 2157 08/08/19 0302 08/08/19 1630 08/09/19 0113 08/09/19 0453 08/09/19 1632 08/09/19 1635 08/09/19 2036 08/10/19 0412 08/10/19 0615  WBC 7.2   < > 11.4* 10.5 10.4  --  10.6*  --  10.3  --   NEUTROABS 4.7  --   --   --   --   --   --   --   --   --   HGB 8.1*  --  8.8* 8.2* 9.2* 8.5* 8.8* 8.2* 9.4*  8.8*  HCT 25.8*  --  27.6* 25.3* 28.0* 25.0* 27.3* 24.0* 28.6* 26.0*  MCV 90.5   < > 92.0 90.0 90.0  --  91.9  --  91.4  --   PLT 55*   < > 51* 48* 49*  --  61*  --  54*  --    < > = values in this interval not displayed.    Cardiac Enzymes: No results for input(s): CKTOTAL, CKMB, CKMBINDEX, TROPONINI in the last 168 hours.  BNP: BNP (last 3 results) Recent Labs    07/22/19 1039  BNP 15.3    ProBNP (last 3 results) No results for input(s): PROBNP in the last 8760 hours.    Other results:  Imaging: DG Chest Port 1 View  Result Date: 08/10/2019 CLINICAL DATA:  Acute respiratory failure with hypoxia. EXAM: PORTABLE CHEST 1 VIEW COMPARISON:  August 09, 2019. FINDINGS: Endotracheal and feeding tubes are unchanged in position. Left internal jugular catheter is unchanged. Bilateral chest tubes are noted without pneumothorax. There remains diffuse opacification of both hemi thoraces consistent with pneumonia or atelectasis and associated effusions. Bony thorax is unremarkable. IMPRESSION: Stable support apparatus. Stable bilateral chest tubes without pneumothorax. Stable bilateral lung opacities are noted concerning for pneumonia or atelectasis with associated pleural effusions. Electronically Signed   By: Marijo Conception M.D.   On: 08/10/2019 08:19   DG CHEST PORT 1 VIEW  Result Date: 08/09/2019 CLINICAL DATA:  Complications of chest tube. EXAM: PORTABLE CHEST 1 VIEW COMPARISON:  Radiograph yesterday. FINDINGS: Tracheostomy tube  tip at the thoracic inlet. Enteric tube tip below the diaphragm not included in the field of view. Left central line remains in place. Bilateral ECMO catheters from an inferior approach. Bilateral chest tubes. Progressive opacification of the right hemithorax may represent pleural effusions, with confluent apical on right midlung density in addition to basilar opacity. Underlying diffuse heterogeneous airspace disease. No obvious pneumothorax. IMPRESSION: 1.  Progressive opacification of the right hemithorax may represent partially loculated pleural fluid versus progressive airspace disease. 2. Otherwise diffuse bilateral opacification of both hemi thoraces, not significantly changed. 3. Bilateral chest tubes in place. No obvious pneumothorax. Support apparatus unchanged. Electronically Signed   By: Keith Rake M.D.   On: 08/09/2019 22:55     Medications:     Scheduled Medications: . artificial tears  1 application Both Eyes G9E  . vitamin C  500 mg Per Tube Daily  . chlorhexidine gluconate (MEDLINE KIT)  15 mL Mouth Rinse BID  . Chlorhexidine Gluconate Cloth  6 each Topical Daily  . clonazePAM  2 mg Per Tube BID  . dexamethasone (DECADRON) injection  3 mg Intravenous Q24H   Followed by  . [START ON 08/13/2019] dexamethasone (DECADRON) injection  1 mg Intravenous Q24H  . feeding supplement (PRO-STAT SUGAR FREE 64)  30 mL Per Tube BID  . free water  400 mL Per Tube Q8H  . gabapentin  300 mg Per Tube Q8H  . insulin aspart  1 Units Subcutaneous Q4H  . insulin aspart  3-9 Units Subcutaneous Q4H  . magnesium hydroxide  15 mL Oral Daily  . mouth rinse  15 mL Mouth Rinse 10 times per day  . oxyCODONE  10 mg Per Tube Q6H  . pantoprazole (PROTONIX) IV  40 mg Intravenous QHS  . QUEtiapine  25 mg Per Tube BID  . senna-docusate  1 tablet Per Tube BID  . sodium chloride flush  10-40 mL Intracatheter Q12H  . Thrombi-Pad  1 each Topical Once  . zinc sulfate  220 mg Per Tube Daily    Infusions: . sodium chloride    . sodium chloride Stopped (08/08/19 1922)  . albumin human Stopped (08/09/19 2114)  . cisatracurium (NIMBEX) infusion    . dextrose    . feeding supplement (PIVOT 1.5 CAL) 1,000 mL (08/08/19 1047)  . heparin Stopped (08/10/19 0550)  . HYDROmorphone 2 mg/hr (08/10/19 0700)  . midazolam (VERSED) infusion 16 mg/hr (08/10/19 0700)  . propofol (DIPRIVAN) infusion 40 mcg/kg/min (08/10/19 0700)    PRN Medications: Place/Maintain  arterial line **AND** sodium chloride, sodium chloride, acetaminophen, dextrose, hydrALAZINE, HYDROmorphone, labetalol, metoprolol tartrate, midazolam, ondansetron (ZOFRAN) IV, sodium chloride, sodium chloride flush   Assessment/Plan:    1. Acute hypoxic/hypercarbic respiratory failure due to COVID PNA: Remained markedly hypoxic despite full support. Intubated 12/26. S/p bilateral CTs 12/26 for pneumothoraces.  TEE on 12/26 LVEF 70% RV ok. VV ECMO begun 12/26. Day #7 VV ECMO. Tracheostomy 12/29.  COVID ARDS at this point.  Flows stable currently but drop with attempted diuresis and also blood loss from trach, chest tubes, CVL. Oxygen saturation stable, good color change between cannulas, suspect level of mixing acceptable.  Plts low but LDH also lower today and not suggestive of significant hemolysis. Weight continues to rise.  - Hold Lasix today given flow issues when given.  - Continue to hold heparin gtt until noon, then will lower heparin level goal to 0.3-0.4.  2. Bilateral PTX: Due to barotrauma.  3. COVID PNA: management of COVID infection per CCM.  CXR with bilateral multifocal PNA progressing to likely ARDS.  - s/p 10 days of remdesivir - 12/16, 12/2 s/p tociluzimab - 12/17 s/p convalescent plasma - On decadron - Off abx.   4. ID: Empiric coverage with vancomycin/cefepime for possible secondary bacterial PNA, course completed.   5. Thrombocytopenia: Slow decrease in platelets, likely low level hemolysis + critical illness.  6. Anemia: Blood loss at trach site and chest tube sites.  3 units PRBCs overnight and 2 FFP.  - Transfuse hgb < 8.  - Hold heparin gtt until noon, lower heparin level to 0.3-0.4.  CBC at noon.   Length of Stay: [place] Performed by: Loralie Champagne  Total critical care time: 35 minutes  Critical care time was exclusive of separately billable procedures and treating other patients.  Critical care was necessary to treat or prevent imminent or  life-threatening deterioration.  Critical care was time spent personally by me on the following activities: development of treatment plan with patient and/or surrogate as well as nursing, discussions with consultants, evaluation of patient's response to treatment, examination of patient, obtaining history from patient or surrogate, ordering and performing treatments and interventions, ordering and review of laboratory studies, ordering and review of radiographic studies, pulse oximetry and re-evaluation of patient's condition.    Loralie Champagne MD 08/10/2019, 8:34 AM  Advanced Heart Failure Team Pager [phone] (M-F; 7a - 4p)  Please contact [facility] for night-coverage after hours (4p -7a ) and weekends on [email or web address]

## 2019-08-10 NOTE — Progress Notes (Signed)
ECMO note:  Myself, bedside RN and additional staff RN in room to turn and clean pt.  During this process pt was stable on ECMO with sats remaining in the high 90's.  After cleaning up pt and changing the sheets, the bed was lowered and seemed to catch on something on the vent and then dropped off whatever had caught.  Upon further inspection, neither the bedside nurse nor myself could identify anything out of place or broken, and came to the conclusion that it must have been caught on the base platform or the arm holder.  About a minute passed and the ventilator began alarming "0" tital volume and "0" minute ventilation.  Everyone in the room rechecked and still couldn't identify anything visibly wrong with the ventilator so RT was called.  It took several minutes to arrive.  Pt was receiving no support from the vent but was fully supported on ECMO with FdO2 (fraction of delivered O2) at 100%. During this time pt's sats initially remained in the high 90's but began to slowly drop off to 89, at which time it was decided to bag him very gently with the peep valve on the lowest setting.  With very small breaths we were able to get pt's sats back to 97% until respiratory arrived with a replacement ventilator.  After switching ventilators out, on the same settings, pt's sats remained 97-98%.  See results for 1700 ABG.    Karsten Ro, RN

## 2019-08-10 NOTE — Progress Notes (Signed)
ANTICOAGULATION CONSULT NOTE  Pharmacy Consult for heparin Indication: ECMO  No Known Allergies  Patient Measurements: Height: 5' 8"  (172.7 cm) Weight: 285 lb 15 oz (129.7 kg) IBW/kg (Calculated) : 68.4 Heparin Dosing Weight: 100.7 kg  Vital Signs: Temp: 97.3 F (36.3 C) (01/02 0945) Temp Source: Core (01/02 0945) BP: 162/60 (01/02 0715) Pulse Rate: 63 (01/02 1000)  Labs: Recent Labs    08/08/19 0314 08/08/19 0811 08/09/19 0453 08/09/19 1635 08/09/19 2036 08/10/19 0412 08/10/19 0615  HGB 8.1*  --  9.2* 8.8* 8.2* 9.4* 8.8*  HCT 25.1*  --  28.0* 27.3* 24.0* 28.6* 26.0*  PLT 55*   < > 49* 61*  --  54*  --   APTT 86*   < > 98* 61*  --  73*  --   LABPROT 17.0*  --  15.9*  --   --  15.8*  --   INR 1.4*  --  1.3*  --   --  1.3*  --   HEPARINUNFRC  --    < > 0.55 0.28*  --  0.40  --   CREATININE 0.64   < > 0.50* 0.52*  --  0.49*  --    < > = values in this interval not displayed.    Estimated Creatinine Clearance: 153.2 mL/min (A) (by C-G formula based on SCr of 0.49 mg/dL (L)).   Medical History: Past Medical History:  Diagnosis Date  . Asthma   . GERD (gastroesophageal reflux disease)   . Headache   . History of kidney stones    LEFT URETERAL STONE  . HTN (hypertension)   . Pancreatitis 2018   GALLBALDDER SLUDGE CAUSED ISSUED RESOLVED    Medications:  Scheduled:  . artificial tears  1 application Both Eyes Q4O  . vitamin C  500 mg Per Tube Daily  . chlorhexidine gluconate (MEDLINE KIT)  15 mL Mouth Rinse BID  . Chlorhexidine Gluconate Cloth  6 each Topical Daily  . clonazePAM  2 mg Per Tube BID  . dexamethasone (DECADRON) injection  3 mg Intravenous Q24H   Followed by  . [START ON 08/13/2019] dexamethasone (DECADRON) injection  1 mg Intravenous Q24H  . feeding supplement (PRO-STAT SUGAR FREE 64)  30 mL Per Tube BID  . free water  400 mL Per Tube Q8H  . gabapentin  300 mg Per Tube Q8H  . insulin aspart  1 Units Subcutaneous Q4H  . insulin aspart  3-9 Units  Subcutaneous Q4H  . magnesium hydroxide  15 mL Oral Daily  . mouth rinse  15 mL Mouth Rinse 10 times per day  . oxyCODONE  10 mg Per Tube Q6H  . pantoprazole (PROTONIX) IV  40 mg Intravenous QHS  . QUEtiapine  25 mg Per Tube BID  . senna-docusate  1 tablet Per Tube BID  . sodium chloride flush  10-40 mL Intracatheter Q12H  . Thrombi-Pad  1 each Topical Once  . zinc sulfate  220 mg Per Tube Daily    Assessment: 42 yom known COVID+, has been declining with ARDS for past 48 hours requiring intubation and max vent settings. Underwent VV ECMO cannulation on 12/26 and now continues on IV heparin.  Heparin was held early this morning due to increased bleeding overnight. Orders for FFP, platelets, and PRBC.   Heparin level with gtt rate of 1450 units/hr was within goal at 0.4, aPTT within goal at 73. Hgb is 8.8 and pltc 54.  Per Dr. [name], ok to restart heparin gtt at  noon and target lower heparin goal 0.3-0.4 with increased bleeding.     Goal of Therapy:  Heparin level 0.3-0.4 units/ml >> targeting lower goal with bleed Monitor platelets by anticoagulation protocol: Yes   Plan:  Restart heparin drip at 1400 units/hr at noon Next heparin level at 1800 tonight, then continue q12h heparin levels at 5AM/5 PM Will discuss with ECMO team prior to heparin rate changes   Vertis Kelch, PharmD, [facility] PGY2 Cardiology Pharmacy Resident Phone [phone] 08/10/2019       10:50 AM  Please check [email or web address] for unit-specific pharmacist phone numbers

## 2019-08-10 NOTE — Procedures (Signed)
Bronchoscopy Procedure Note [name] [id number] 1976/04/11  Procedure: Bronchoscopy Indications: Diagnostic evaluation of the airways  Procedure Details Consent: Risks of procedure as well as the alternatives and risks of each were explained to the (patient/caregiver).  Consent for procedure obtained. Time Out: Verified patient identification, verified procedure, site/side was marked, verified correct patient position, special equipment/implants available, medications/allergies/relevent history reviewed, required imaging and test results available.  Performed  In preparation for procedure, patient was given 100% FiO2 and bronchoscope lubricated. Sedation: Benzodiazepines, Muscle relaxants and Etomidate  Airway entered and the following bronchi were examined: RUL, RML, RLL, LUL, LLL and Bronchi. No obvious bleeding in the airways noted.  Minimal blood clots in the lower lobes which were suctioned out.  Bronchial washing obtained in the left lower lobe.  Evaluation Hemodynamic Status: BP stable throughout; O2 sats: stable throughout Patient's Current Condition: stable Specimens:  None Complications: No apparent complications Patient did tolerate procedure well.   [name] MD [facility] Please see [email or web address] for pager details.  08/10/2019, 12:06 PM

## 2019-08-10 NOTE — Social Work (Signed)
TOC team acknowledging consult for support with disposition ([place] HF screen/DME/Home Health/COPD support). At this time pt not medically stable, will follow along with team when time is right to arrange services for disposition.   Westley Hummer, MSW, [place] Work

## 2019-08-10 NOTE — Progress Notes (Signed)
RT called by RN saying patients ventilator stopped giving the adequate VT's and wasn't ventilating. RT switched ventilators out and put old vent on standby. Patient ventilating well on new ventilator. RT will continue to monitor.

## 2019-08-11 ENCOUNTER — Encounter (HOSPITAL_COMMUNITY): Payer: Self-pay | Admitting: Anesthesiology

## 2019-08-11 ENCOUNTER — Inpatient Hospital Stay: Payer: Self-pay

## 2019-08-11 LAB — BASIC METABOLIC PANEL
Anion gap: 5 (ref 5–15)
Anion gap: 6 (ref 5–15)
BUN: 27 mg/dL — ABNORMAL HIGH (ref 6–20)
BUN: 25 mg/dL — ABNORMAL HIGH (ref 6–20)
CO2: 25 mmol/L (ref 22–32)
CO2: 26 mmol/L (ref 22–32)
Calcium: 7.9 mg/dL — ABNORMAL LOW (ref 8.9–10.3)
Calcium: 8 mg/dL — ABNORMAL LOW (ref 8.9–10.3)
Chloride: 103 mmol/L (ref 98–111)
Chloride: 106 mmol/L (ref 98–111)
Creatinine, Ser: 0.42 mg/dL — ABNORMAL LOW (ref 0.61–1.24)
Creatinine, Ser: 0.47 mg/dL — ABNORMAL LOW (ref 0.61–1.24)
GFR calc Af Amer: >60 mL/min (ref >60)
GFR calc Af Amer: >60 mL/min (ref >60)
GFR calc non Af Amer: >60 mL/min (ref >60)
GFR calc non Af Amer: >60 mL/min (ref >60)
Glucose, Bld: 149 mg/dL — ABNORMAL HIGH (ref 70–99)
Glucose, Bld: 207 mg/dL — ABNORMAL HIGH (ref 70–99)
Potassium: 4.5 mmol/L (ref 3.5–5.1)
Potassium: 5.1 mmol/L (ref 3.5–5.1)
Sodium: 133 mmol/L — ABNORMAL LOW (ref 135–145)
Sodium: 138 mmol/L (ref 135–145)

## 2019-08-11 LAB — POCT I-STAT 7, (LYTES, BLD GAS, ICA,H+H)
Acid-Base Excess: 1 mmol/L (ref 0.0–2.0)
Acid-Base Excess: 1 mmol/L (ref 0.0–2.0)
Acid-Base Excess: 2 mmol/L (ref 0.0–2.0)
Acid-Base Excess: 2 mmol/L (ref 0.0–2.0)
Bicarbonate: 25.2 mmol/L (ref 20.0–28.0)
Bicarbonate: 26.2 mmol/L (ref 20.0–28.0)
Bicarbonate: 25.6 mmol/L (ref 20.0–28.0)
Bicarbonate: 26.5 mmol/L (ref 20.0–28.0)
Calcium, Ion: 1.21 mmol/L (ref 1.15–1.40)
Calcium, Ion: 1.2 mmol/L (ref 1.15–1.40)
Calcium, Ion: 1.19 mmol/L (ref 1.15–1.40)
Calcium, Ion: 1.23 mmol/L (ref 1.15–1.40)
HCT: 21 % — ABNORMAL LOW (ref 39.0–52.0)
HCT: 21 % — ABNORMAL LOW (ref 39.0–52.0)
HCT: 25 % — ABNORMAL LOW (ref 39.0–52.0)
HCT: 28 % — ABNORMAL LOW (ref 39.0–52.0)
Hemoglobin: 7.1 g/dL — ABNORMAL LOW (ref 13.0–17.0)
Hemoglobin: 7.1 g/dL — ABNORMAL LOW (ref 13.0–17.0)
Hemoglobin: 8.5 g/dL — ABNORMAL LOW (ref 13.0–17.0)
Hemoglobin: 9.5 g/dL — ABNORMAL LOW (ref 13.0–17.0)
O2 Saturation: 94 %
O2 Saturation: 89 %
O2 Saturation: 87 %
O2 Saturation: 87 %
Patient temperature: 36.2
Patient temperature: 36.3
Patient temperature: 36.3
Patient temperature: 36.3
Potassium: 4.5 mmol/L (ref 3.5–5.1)
Potassium: 4.4 mmol/L (ref 3.5–5.1)
Potassium: 5.1 mmol/L (ref 3.5–5.1)
Potassium: 4.8 mmol/L (ref 3.5–5.1)
Sodium: 134 mmol/L — ABNORMAL LOW (ref 135–145)
Sodium: 136 mmol/L (ref 135–145)
Sodium: 139 mmol/L (ref 135–145)
Sodium: 139 mmol/L (ref 135–145)
TCO2: 26 mmol/L (ref 22–32)
TCO2: 27 mmol/L (ref 22–32)
TCO2: 27 mmol/L (ref 22–32)
TCO2: 28 mmol/L (ref 22–32)
pCO2 arterial: 37 mmHg (ref 32.0–48.0)
pCO2 arterial: 39.9 mmHg (ref 32.0–48.0)
pCO2 arterial: 36 mmHg (ref 32.0–48.0)
pCO2 arterial: 39.3 mmHg (ref 32.0–48.0)
pH, Arterial: 7.438 (ref 7.350–7.450)
pH, Arterial: 7.422 (ref 7.350–7.450)
pH, Arterial: 7.457 — ABNORMAL HIGH (ref 7.350–7.450)
pH, Arterial: 7.434 (ref 7.350–7.450)
pO2, Arterial: 64 mmHg — ABNORMAL LOW (ref 83.0–108.0)
pO2, Arterial: 53 mmHg — ABNORMAL LOW (ref 83.0–108.0)
pO2, Arterial: 49 mmHg — ABNORMAL LOW (ref 83.0–108.0)
pO2, Arterial: 49 mmHg — ABNORMAL LOW (ref 83.0–108.0)

## 2019-08-11 LAB — BPAM PLATELET PHERESIS
Blood Product Expiration Date: 202101032359
ISSUE DATE / TIME: 202101020653
Unit Type and Rh: 6200

## 2019-08-11 LAB — CBC
HCT: 22.8 % — ABNORMAL LOW (ref 39.0–52.0)
HCT: 22.9 % — ABNORMAL LOW (ref 39.0–52.0)
HCT: 24.7 % — ABNORMAL LOW (ref 39.0–52.0)
HCT: 25.1 % — ABNORMAL LOW (ref 39.0–52.0)
HCT: 29.8 % — ABNORMAL LOW (ref 39.0–52.0)
Hemoglobin: 10 g/dL — ABNORMAL LOW (ref 13.0–17.0)
Hemoglobin: 7.3 g/dL — ABNORMAL LOW (ref 13.0–17.0)
Hemoglobin: 7.7 g/dL — ABNORMAL LOW (ref 13.0–17.0)
Hemoglobin: 8.2 g/dL — ABNORMAL LOW (ref 13.0–17.0)
Hemoglobin: 8.3 g/dL — ABNORMAL LOW (ref 13.0–17.0)
MCH: 29.6 pg (ref 26.0–34.0)
MCH: 29.8 pg (ref 26.0–34.0)
MCH: 30.1 pg (ref 26.0–34.0)
MCH: 30.2 pg (ref 26.0–34.0)
MCH: 30.7 pg (ref 26.0–34.0)
MCHC: 32 g/dL (ref 30.0–36.0)
MCHC: 32.7 g/dL (ref 30.0–36.0)
MCHC: 33.6 g/dL (ref 30.0–36.0)
MCHC: 33.6 g/dL (ref 30.0–36.0)
MCHC: 33.6 g/dL (ref 30.0–36.0)
MCV: 89.8 fL (ref 80.0–100.0)
MCV: 89.8 fL (ref 80.0–100.0)
MCV: 90.6 fL (ref 80.0–100.0)
MCV: 91.2 fL (ref 80.0–100.0)
MCV: 93.1 fL (ref 80.0–100.0)
Platelets: 44 10*3/uL — ABNORMAL LOW (ref 150–400)
Platelets: 44 10*3/uL — ABNORMAL LOW (ref 150–400)
Platelets: 51 10*3/uL — ABNORMAL LOW (ref 150–400)
Platelets: 67 10*3/uL — ABNORMAL LOW (ref 150–400)
Platelets: 73 10*3/uL — ABNORMAL LOW (ref 150–400)
RBC: 2.45 MIL/uL — ABNORMAL LOW (ref 4.22–5.81)
RBC: 2.51 MIL/uL — ABNORMAL LOW (ref 4.22–5.81)
RBC: 2.75 MIL/uL — ABNORMAL LOW (ref 4.22–5.81)
RBC: 2.77 MIL/uL — ABNORMAL LOW (ref 4.22–5.81)
RBC: 3.32 MIL/uL — ABNORMAL LOW (ref 4.22–5.81)
RDW: 16.3 % — ABNORMAL HIGH (ref 11.5–15.5)
RDW: 16.3 % — ABNORMAL HIGH (ref 11.5–15.5)
RDW: 16.5 % — ABNORMAL HIGH (ref 11.5–15.5)
RDW: 16.5 % — ABNORMAL HIGH (ref 11.5–15.5)
RDW: 16.5 % — ABNORMAL HIGH (ref 11.5–15.5)
WBC: 8.8 10*3/uL (ref 4.0–10.5)
WBC: 9.1 10*3/uL (ref 4.0–10.5)
WBC: 9.2 10*3/uL (ref 4.0–10.5)
WBC: 9.2 10*3/uL (ref 4.0–10.5)
WBC: 9.8 10*3/uL (ref 4.0–10.5)
nRBC: 2.3 % — ABNORMAL HIGH (ref 0.0–0.2)
nRBC: 2.4 % — ABNORMAL HIGH (ref 0.0–0.2)
nRBC: 2.4 % — ABNORMAL HIGH (ref 0.0–0.2)
nRBC: 2.9 % — ABNORMAL HIGH (ref 0.0–0.2)
nRBC: 3 % — ABNORMAL HIGH (ref 0.0–0.2)

## 2019-08-11 LAB — DIC (DISSEMINATED INTRAVASCULAR COAGULATION)PANEL
D-Dimer, Quant: 1.78 ug/mL-FEU — ABNORMAL HIGH (ref 0.00–0.50)
Fibrinogen: 131 mg/dL — ABNORMAL LOW (ref 210–475)
INR: 1.3 — ABNORMAL HIGH (ref 0.8–1.2)
Platelets: 74 10*3/uL — ABNORMAL LOW (ref 150–400)
Prothrombin Time: 15.7 seconds — ABNORMAL HIGH (ref 11.4–15.2)
Smear Review: NONE SEEN
aPTT: 42 seconds — ABNORMAL HIGH (ref 24–36)

## 2019-08-11 LAB — PREPARE PLATELET PHERESIS: Unit division: 0

## 2019-08-11 LAB — BPAM FFP
Blood Product Expiration Date: 202101072359
Blood Product Expiration Date: 202101072359
ISSUE DATE / TIME: 202101020934
ISSUE DATE / TIME: 202101020934
Unit Type and Rh: 5100
Unit Type and Rh: 9500

## 2019-08-11 LAB — APTT
aPTT: 87 seconds — ABNORMAL HIGH (ref 24–36)
aPTT: 53 seconds — ABNORMAL HIGH (ref 24–36)

## 2019-08-11 LAB — HEPARIN LEVEL (UNFRACTIONATED)
Heparin Unfractionated: 0.3 IU/mL (ref 0.30–0.70)
Heparin Unfractionated: <0.1 IU/mL — ABNORMAL LOW (ref 0.30–0.70)
Heparin Unfractionated: 0.16 IU/mL — ABNORMAL LOW (ref 0.30–0.70)

## 2019-08-11 LAB — FIBRINOGEN: Fibrinogen: 137 mg/dL — ABNORMAL LOW (ref 210–475)

## 2019-08-11 LAB — PROTIME-INR
INR: 1.3 — ABNORMAL HIGH (ref 0.8–1.2)
INR: 1.2 (ref 0.8–1.2)
Prothrombin Time: 16 seconds — ABNORMAL HIGH (ref 11.4–15.2)
Prothrombin Time: 14.8 seconds (ref 11.4–15.2)

## 2019-08-11 LAB — GLUCOSE, CAPILLARY
Glucose-Capillary: 152 mg/dL — ABNORMAL HIGH (ref 70–99)
Glucose-Capillary: 151 mg/dL — ABNORMAL HIGH (ref 70–99)
Glucose-Capillary: 177 mg/dL — ABNORMAL HIGH (ref 70–99)
Glucose-Capillary: 208 mg/dL — ABNORMAL HIGH (ref 70–99)
Glucose-Capillary: 184 mg/dL — ABNORMAL HIGH (ref 70–99)
Glucose-Capillary: 170 mg/dL — ABNORMAL HIGH (ref 70–99)

## 2019-08-11 LAB — COOXEMETRY PANEL
Carboxyhemoglobin: 2.7 % — ABNORMAL HIGH (ref 0.5–1.5)
Methemoglobin: 1.5 % (ref 0.0–1.5)
O2 Saturation: 70.3 %
Total hemoglobin: 7.8 g/dL — ABNORMAL LOW (ref 12.0–16.0)

## 2019-08-11 LAB — PREPARE FRESH FROZEN PLASMA
Unit division: 0
Unit division: 0

## 2019-08-11 LAB — HEPATIC FUNCTION PANEL
ALT: 44 U/L (ref 0–44)
AST: 22 U/L (ref 15–41)
Albumin: 2.9 g/dL — ABNORMAL LOW (ref 3.5–5.0)
Alkaline Phosphatase: 37 U/L — ABNORMAL LOW (ref 38–126)
Bilirubin, Direct: 0.2 mg/dL (ref 0.0–0.2)
Indirect Bilirubin: 0.5 mg/dL (ref 0.3–0.9)
Total Bilirubin: 0.7 mg/dL (ref 0.3–1.2)
Total Protein: 4.3 g/dL — ABNORMAL LOW (ref 6.5–8.1)

## 2019-08-11 LAB — PREPARE RBC (CROSSMATCH)

## 2019-08-11 LAB — LACTIC ACID, PLASMA: Lactic Acid, Venous: 1.8 mmol/L (ref 0.5–1.9)

## 2019-08-11 LAB — LACTATE DEHYDROGENASE: LDH: 271 U/L — ABNORMAL HIGH (ref 98–192)

## 2019-08-11 LAB — TRIGLYCERIDES: Triglycerides: 256 mg/dL — ABNORMAL HIGH (ref <150)

## 2019-08-11 MED ORDER — SODIUM CHLORIDE 0.9% IV SOLUTION: Freq: Once | INTRAVENOUS | Status: AC

## 2019-08-11 MED ORDER — "THROMBI-PAD 3""X3"" EX PADS"
1.0000 | MEDICATED_PAD | Freq: Once | CUTANEOUS | Status: AC
  Administered 2019-08-11: 1 via TOPICAL
  Filled 2019-08-11: qty 1

## 2019-08-11 MED ORDER — SODIUM CHLORIDE 0.9% FLUSH
10.0000 mL | Freq: Two times a day (BID) | INTRAVENOUS | Status: DC
  Administered 2019-08-11 (×2): 10 mL
  Administered 2019-08-12: 20 mL
  Administered 2019-08-12 – 2019-08-13 (×2): 10 mL
  Administered 2019-08-16: 20 mL
  Administered 2019-08-17 – 2019-08-25 (×9): 10 mL
  Administered 2019-08-26: 10:00:00 20 mL
  Administered 2019-08-27 – 2019-09-02 (×11): 10 mL
  Administered 2019-09-02: 10:00:00 20 mL
  Administered 2019-09-03 – 2019-09-18 (×20): 10 mL
  Administered 2019-09-18: 40 mL
  Administered 2019-09-19: 30 mL
  Administered 2019-09-20 – 2019-09-26 (×9): 10 mL
  Administered 2019-09-27: 20 mL
  Administered 2019-09-28 – 2019-10-01 (×6): 10 mL

## 2019-08-11 MED ORDER — SODIUM CHLORIDE 0.9% FLUSH: 10.0000 mL | INTRAVENOUS | Status: DC | PRN

## 2019-08-11 MED ORDER — ALBUMIN HUMAN 5 % IV SOLN
25.0000 g | INTRAVENOUS | Status: DC | PRN
  Administered 2019-08-11 – 2019-08-12 (×3): 25 g via INTRAVENOUS
  Administered 2019-08-12 – 2019-08-14 (×2): 12.5 g via INTRAVENOUS
  Administered 2019-08-16 – 2019-08-18 (×2): 25 g via INTRAVENOUS
  Administered 2019-08-19 – 2019-08-24 (×8): 12.5 g via INTRAVENOUS
  Administered 2019-08-25: 25 g via INTRAVENOUS
  Administered 2019-08-25 – 2019-09-03 (×9): 12.5 g via INTRAVENOUS
  Administered 2019-09-05 – 2019-09-07 (×7): 25 g via INTRAVENOUS
  Administered 2019-09-07: 12.5 g via INTRAVENOUS
  Filled 2019-08-11: qty 250
  Filled 2019-08-11: qty 500
  Filled 2019-08-11 (×5): qty 250
  Filled 2019-08-11: qty 500
  Filled 2019-08-11 (×3): qty 250
  Filled 2019-08-11: qty 500
  Filled 2019-08-11 (×3): qty 250
  Filled 2019-08-11: qty 500
  Filled 2019-08-11: qty 250
  Filled 2019-08-11 (×2): qty 500
  Filled 2019-08-11: qty 250
  Filled 2019-08-11: qty 500
  Filled 2019-08-11: qty 250
  Filled 2019-08-11: qty 500
  Filled 2019-08-11: qty 250
  Filled 2019-08-11: qty 500
  Filled 2019-08-11 (×2): qty 250
  Filled 2019-08-11 (×2): qty 500
  Filled 2019-08-11: qty 250

## 2019-08-11 NOTE — Progress Notes (Signed)
Assisted tele visit to patient with family member.  Dorisann Schwanke M, RN  

## 2019-08-11 NOTE — Progress Notes (Addendum)
ECMO Specialist Goal for the shift is to establish and maintain adequate oxygenation via utilization of VV ECMO using appropriate settings and flows to achieve optimal  tissue/organ oxygenation and perfusion. Patient is currently stable at this time and will continue to monitor patient clinical presentation and oxygenation status throughout the night.  Lavena Loretto L. Tamala Julian, BS, RRT, RCP

## 2019-08-11 NOTE — Progress Notes (Signed)
At [phone] patient had a brief chugging/chattering event in his drainage cannula resulting in decrease Flows and sustained desaturations in the 70's. Patient was volume depleted some requiring more volume in order to maintain flows. Once patient got albumin x2 patient was able to flow better on the ECMO circuit and with some time was able to achieve better saturations. Patient is now stable at this time. Oxygenation saturations are now more acceptable.  No further complications noted.

## 2019-08-11 NOTE — Progress Notes (Signed)
PICC consent obtained via telephone from wife and another family member, Estill Bamberg.  All questions answered at this time.

## 2019-08-11 NOTE — Progress Notes (Signed)
NAME:  [name], MRN:  [id number], DOB:  17-Feb-1976, LOS: 21 ADMISSION DATE:  07/22/2019, CONSULTATION DATE:  12/24 REFERRING MD:  Sloan Leiter, CHIEF COMPLAINT:  Dyspnea   Brief History   44 y/o M admitted 12/14 with COVID pneumonia causing acute hypoxemic respiratory failure. Developed pneumomediastinum 12/23 with concern for possible bilateral small pneumothoraces on 12/24.  PCCM consulted for evaluation of pneumothoraces  Past Medical History  GERD HTN Asthma  Significant Hospital Events   12/14 admit with hypoxemic respiratory failure in setting of COVID-19 pneumonia 12/23 noted to have pneumomediastinum on chest x-ray 12/24 PCCM consulted for evaluation 12/26 worsening hypoxemia, intubated  12/27 VV fem/fem ECMO cannulation 12/28 ETT switch 12/29 Tracheostomy 12/31 Oozing from multiple sites, H/H drop. Heparin held briefly and lidocaine with epi injected around trach site, Cryo, DDVAP given 1/3 CVL being discontinued   Consults:  PCCM  Procedures:    Significant Diagnostic Tests:  CT chest 12/22 >> extensive pneumomediastinum, multifocal patchy bilateral groundglass opacitiesCT chest 12/22 >> extensive pneumomediastinum, multifocal patchy bilateral groundglass opacities  Micro Data:  BCx2 12/14 >> negative  Sputum 12/15 >> negative   Antimicrobials:  Received remdesivir, steroids, actemra and convalescent plasma  5 days cefepime 12/24-12/29   Interim history/subjective:  Remains sedated on VV ecmo Trach site oozing resolved Has received additional FFP, platelets, PRBCs and platelets Plan is to give him cryo and platelets today  Objective   Blood pressure (!) 171/57, pulse 69, temperature (!) 97.3 F (36.3 C), temperature source Core, resp. rate 11, height 5' 8"  (1.727 m), weight 129.7 kg, SpO2 95 %. CVP:  [4 mmHg-28 mmHg] 4 mmHg  Vent Mode: PCV FiO2 (%):  [100 %] 100 % Set Rate:  [30 bmp] 30 bmp PEEP:  [10 cmH20] 10 cmH20 Plateau Pressure:  [24 cmH20-27  cmH20] 27 cmH20   Intake/Output Summary (Last 24 hours) at 08/11/2019 1436 Last data filed at 08/11/2019 1100 Gross per 24 hour  Intake 2775.11 ml  Output 2020 ml  Net 755.11 ml   Filed Weights   08/07/19 0421 08/08/19 0300 08/09/19 0500  Weight: 122 kg 125.4 kg 129.7 kg   Examination Gen:      No acute distress, sedated HEENT:  EOMI, sclera anicteric, trach tube in place Neck:     No masses  lungs:    Poor air movement, no added sounds CV:        S1-S2, no murmurs Abd:   Bowel sounds appreciated Ext:    No edema; adequate peripheral perfusion Skin:      Warm and dry Neuro: Sedated, unresponsive  Resolved Hospital Problem list     Assessment & Plan:  Acute hypoxemic respiratory failure secondary to Covid -Completed remdesivir, Actemra -On VV ECMO secondary to inability to oxygenate -Ventilator mode per protocol -ECMO started 12/26 -Off antibiotics at present -Did complete empiric coverage for bacterial pneumonia with vancomycin/cefepime   Bilateral pneumothoraces and pneumomediastinum -No airleak in bilateral chest tubes -Chest x-ray still looks whited out bilaterally -No evidence of pneumothorax on chest x-ray  Post bronchoscopy -Rare gram-positive cocci in washing  Coagulopathy -Fall cryo and platelet transfusion today -Heparin was on hold, resumed at lower doses  Hypoglycemia -SSI coverage  Palliative care meeting with family today at 2:00  Best practice:   Feeding: TF, FWF Analgesia: see above Sedation: see above Thromboprophylaxis: Heparin resumed Ulcer prophylaxis: pantoprazole Glucose control: SSI Family: Family meeting today with palliative care  Labs   CBC: Recent Labs  Lab 08/07/19 2157 08/08/19  0302 08/10/19 1319 08/10/19 1700 08/10/19 2040 08/11/19 0007 08/11/19 0509 08/11/19 0550 08/11/19 1337  WBC 7.2   < > 9.4 9.0  --  9.2 9.1  --  9.2  NEUTROABS 4.7  --   --   --   --   --   --   --   --   HGB 8.1*  --  8.4* 8.1* 7.1* 7.3*  7.7* 7.1* 8.3*  HCT 25.8*  --  25.7* 24.8* 21.0* 22.8* 22.9* 21.0* 24.7*  MCV 90.5   < > 93.1 91.5  --  93.1 91.2  --  89.8  PLT 55*   < > 47* 47*  --  44* 44*  --  51*   < > = values in this interval not displayed.    Basic Metabolic Panel: Recent Labs  Lab 08/04/19 1644 08/04/19 1651 08/09/19 0453 08/09/19 1635 08/10/19 0412 08/10/19 1700 08/10/19 1712 08/10/19 2040 08/11/19 0509 08/11/19 0550  NA 136  --  142 138 140 137 136 135 133* 134*  K 4.7  --  4.0 4.5 4.7 5.1 5.0 4.8 4.5 4.5  CL 108   < > 111 107 108 104  --   --  103  --   CO2 24   < > 25 24 26 26   --   --  25  --   GLUCOSE 167*   < > 137* 180* 161* 179*  --   --  149*  --   BUN 39*   < > 35* 34* 35* 32*  --   --  27*  --   CREATININE 0.74   < > 0.50* 0.52* 0.49* 0.43*  --   --  0.42*  --   CALCIUM 7.5*   < > 8.1* 8.2* 8.3* 8.3*  --   --  7.9*  --   MG 2.8*  --   --   --  2.1  --   --   --   --   --   PHOS 2.7  --   --   --  3.4  --   --   --   --   --    < > = values in this interval not displayed.   GFR: Estimated Creatinine Clearance: 153.2 mL/min (A) (by C-G formula based on SCr of 0.42 mg/dL (L)). Recent Labs  Lab 08/05/19 1104 08/05/19 1622 08/08/19 0313 08/09/19 0453 08/10/19 0412 08/10/19 1700 08/11/19 0007 08/11/19 0509 08/11/19 0515 08/11/19 1337  PROCALCITON <0.10  --   --   --   --   --   --   --   --   --   WBC  --   --   --  10.4 10.3 9.0 9.2 9.1  --  9.2  LATICACIDVEN  --    < > 1.5 1.7 1.8  --   --   --  1.8  --    < > = values in this interval not displayed.    Liver Function Tests: Recent Labs  Lab 08/07/19 0344 08/08/19 0314 08/09/19 0453 08/10/19 0412 08/11/19 0509  AST 18 62* 32 22 22  ALT 19 95* 92* 67* 44  ALKPHOS 32* 45 43 36* 37*  BILITOT 0.9 0.9 1.0 0.8 0.7  PROT 4.8* 4.6* 4.5* 4.7* 4.3*  ALBUMIN 3.1* 3.2* 3.2* 3.3* 2.9*   No results for input(s): LIPASE, AMYLASE in the last 168 hours. No results for input(s): AMMONIA in the last 168 hours.  ABG    Component  Value Date/Time   PHART 7.438 08/11/2019 0550   PCO2ART 37.0 08/11/2019 0550   PO2ART 64.0 (L) 08/11/2019 0550   HCO3 25.2 08/11/2019 0550   TCO2 26 08/11/2019 0550   ACIDBASEDEF 1.0 08/08/2019 2049   O2SAT 94.0 08/11/2019 0550     Coagulation Profile: Recent Labs  Lab 08/07/19 0344 08/08/19 0314 08/09/19 0453 08/10/19 0412 08/11/19 0509  INR 1.4* 1.4* 1.3* 1.3* 1.3*    Cardiac Enzymes: No results for input(s): CKTOTAL, CKMB, CKMBINDEX, TROPONINI in the last 168 hours.  HbA1C: Hgb A1c MFr Bld  Date/Time Value Ref Range Status  07/24/2019 04:50 AM 6.7 (H) 4.8 - 5.6 % Final    Comment:    (NOTE) Pre diabetes:          5.7%-6.4% Diabetes:              >6.4% Glycemic control for   <7.0% adults with diabetes   09/30/2017 05:36 AM 5.7 (H) 4.8 - 5.6 % Final    Comment:    (NOTE)         Prediabetes: 5.7 - 6.4         Diabetes: >6.4         Glycemic control for adults with diabetes: <7.0     CBG: Recent Labs  Lab 08/10/19 1600 08/10/19 2038 08/10/19 2355 08/11/19 0525 08/11/19 0814  GLUCAP 183* 160* 153* 152* 151*     Critical care time:    The patient is critically ill with multiple organ systems failure and requires high complexity decision making for assessment and support, frequent evaluation and titration of therapies, application of advanced monitoring technologies and extensive interpretation of multiple databases. Critical Care Time devoted to patient care services described in this note independent of APP/resident time (if applicable)  is 30 minutes.   Sherrilyn Rist MD [facility] Personal pager: [phone] If unanswered, please page CCM On-call: [phone]

## 2019-08-11 NOTE — Progress Notes (Signed)
ANTICOAGULATION CONSULT NOTE  Pharmacy Consult for Heparin Indication: ECMO  No Known Allergies  Patient Measurements: Height: _0  (172.7 cm) Weight: 285 lb 15 oz (129.7 kg) IBW/kg (Calculated) : 68.4 Heparin Dosing Weight: 100.7 kg  Vital Signs: Temp: 97.3 F (36.3 C) (01/03 0130) Temp Source: Core (01/03 0130) Pulse Rate: 56 (01/03 0500)  Labs: Recent Labs    08/09/19 0453 08/09/19 1635 08/10/19 0412 08/10/19 0615 08/10/19 1700 08/10/19 1704 08/10/19 2040 08/11/19 0007 08/11/19 0509  HGB 9.2* 8.8* 9.4*  --  8.1*  --  7.1* 7.3* 7.7*  HCT 28.0* 27.3* 28.6*  --  24.8*  --  21.0* 22.8* 22.9*  PLT 49* 61* 54*   < > 47*  --   --  44* 44*  APTT 98* 61* 73*  --   --  60*  --   --   --   LABPROT 15.9*  --  15.8*  --   --   --   --   --   --   INR 1.3*  --  1.3*  --   --   --   --   --   --   HEPARINUNFRC 0.55 0.28* 0.40  --   --  0.16*  --   --  0.30  CREATININE 0.50* 0.52* 0.49*  --  0.43*  --   --   --   --    < > = values in this interval not displayed.    Estimated Creatinine Clearance: 153.2 mL/min (A) (by C-G formula based on SCr of 0.43 mg/dL (L)).   Medical History: Past Medical History:  Diagnosis Date  . Asthma   . GERD (gastroesophageal reflux disease)   . Headache   . History of kidney stones    LEFT URETERAL STONE  . HTN (hypertension)   . Pancreatitis 2018   GALLBALDDER SLUDGE CAUSED ISSUED RESOLVED    Medications:  Scheduled:  . sodium chloride   Intravenous Once  . artificial tears  1 application Both Eyes Q6S  . vitamin C  500 mg Per Tube Daily  . chlorhexidine gluconate (MEDLINE KIT)  15 mL Mouth Rinse BID  . Chlorhexidine Gluconate Cloth  6 each Topical Daily  . clonazePAM  2 mg Per Tube BID  . dexamethasone (DECADRON) injection  3 mg Intravenous Q24H   Followed by  . [START ON 08/13/2019] dexamethasone (DECADRON) injection  1 mg Intravenous Q24H  . feeding supplement (PRO-STAT SUGAR FREE 64)  30 mL Per Tube BID  . free water  400 mL  Per Tube Q8H  . gabapentin  300 mg Per Tube Q8H  . insulin aspart  1 Units Subcutaneous Q4H  . insulin aspart  3-9 Units Subcutaneous Q4H  . magnesium hydroxide  15 mL Oral Daily  . mouth rinse  15 mL Mouth Rinse 10 times per day  . oxyCODONE  10 mg Per Tube Q6H  . pantoprazole (PROTONIX) IV  40 mg Intravenous QHS  . QUEtiapine  25 mg Per Tube BID  . senna-docusate  1 tablet Per Tube BID  . sodium chloride flush  10-40 mL Intracatheter Q12H  . Thrombi-Pad  1 each Topical Once  . zinc sulfate  220 mg Per Tube Daily    Assessment: 56 yom known COVID+, has been declining with ARDS for past 48 hours requiring intubation and max vent settings. Underwent VV ECMO cannulation on 12/26 and now continues on IV heparin.  Heparin was held early this morning due to  increased bleeding overnight. Orders for FFP, platelets, and PRBC. Per Dr. [name], ok to restart heparin gtt at noon and target lower heparin goal 0.3-0.4 with increased bleeding. -heparin level= 0.16 (heparin rate 1400 units/hr) -aPTT= 60 (goal 66-75) -hg= 7.8 (trend down)  1/3 AM update:  Heparin level therapeutic after rate increase Hgb remains low but stable   Goal of Therapy:  Heparin level 0.3-0.4 units/ml >> targeting lower goal with bleed Monitor platelets by anticoagulation protocol: Yes   Plan: -Cont heparin at 1450 units/hr -Confirmatory heparin level at [place], PharmD, [place] Pharmacist Phone: [phone]

## 2019-08-11 NOTE — Progress Notes (Signed)
ANTICOAGULATION CONSULT NOTE  Pharmacy Consult for Heparin Indication: ECMO  No Known Allergies  Patient Measurements: Height: 5' 8"  (172.7 cm) Weight: 285 lb 15 oz (129.7 kg) IBW/kg (Calculated) : 68.4 Heparin Dosing Weight: 100.7 kg  Vital Signs: Temp: 97.3 F (36.3 C) (01/03 1825) Temp Source: Core (01/03 1825) BP: 167/52 (01/03 1825) Pulse Rate: 68 (01/03 1825)  Labs: Recent Labs    08/10/19 0412 08/10/19 0615 08/10/19 1700 08/10/19 1704 08/11/19 0509 08/11/19 1337 08/11/19 1645 08/11/19 1649 08/11/19 1820 08/11/19 2030 08/11/19 2033  HGB 9.4*  --  8.1*   < > 7.7* 8.3* 8.2*  --  8.5* 9.5*  --   HCT 28.6*  --  24.8*   < > 22.9* 24.7* 25.1*  --  25.0* 28.0*  --   PLT 54*   < > 47*   < > 44* 51* 73* 74*  --   --   --   APTT 73*   < >  --   --  87*  --   --  42*  --   --  53*  LABPROT 15.8*  --   --   --  16.0*  --   --  15.7*  --   --   --   INR 1.3*  --   --   --  1.3*  --   --  1.3*  --   --   --   HEPARINUNFRC 0.40   < >  --   --  0.30  --  <0.10*  --   --   --  0.16*  CREATININE 0.49*  --  0.43*  --  0.42*  --  0.47*  --   --   --   --    < > = values in this interval not displayed.    Estimated Creatinine Clearance: 153.2 mL/min (A) (by C-G formula based on SCr of 0.47 mg/dL (L)).   Medical History: Past Medical History:  Diagnosis Date  . Asthma   . GERD (gastroesophageal reflux disease)   . Headache   . History of kidney stones    LEFT URETERAL STONE  . HTN (hypertension)   . Pancreatitis 2018   GALLBALDDER SLUDGE CAUSED ISSUED RESOLVED    Medications:  Scheduled:  . artificial tears  1 application Both Eyes Z3Y  . vitamin C  500 mg Per Tube Daily  . chlorhexidine gluconate (MEDLINE KIT)  15 mL Mouth Rinse BID  . Chlorhexidine Gluconate Cloth  6 each Topical Daily  . clonazePAM  2 mg Per Tube BID  . dexamethasone (DECADRON) injection  3 mg Intravenous Q24H   Followed by  . [START ON 08/13/2019] dexamethasone (DECADRON) injection  1 mg  Intravenous Q24H  . feeding supplement (PRO-STAT SUGAR FREE 64)  30 mL Per Tube BID  . free water  400 mL Per Tube Q8H  . gabapentin  300 mg Per Tube Q8H  . insulin aspart  1 Units Subcutaneous Q4H  . insulin aspart  3-9 Units Subcutaneous Q4H  . magnesium hydroxide  15 mL Oral Daily  . mouth rinse  15 mL Mouth Rinse 10 times per day  . oxyCODONE  10 mg Per Tube Q6H  . pantoprazole (PROTONIX) IV  40 mg Intravenous QHS  . QUEtiapine  25 mg Per Tube BID  . senna-docusate  1 tablet Per Tube BID  . sodium chloride flush  10-40 mL Intracatheter Q12H  . sodium chloride flush  10-40 mL Intracatheter Q12H  .  Thrombi-Pad  1 each Topical Once  . zinc sulfate  220 mg Per Tube Daily    Assessment: 58 yom known COVID+, has been declining with ARDS for past 48 hours requiring intubation and max vent settings. Underwent VV ECMO cannulation on 12/26 and now continues on IV heparin.  Heparin was held 08/10/19 due to increased bleeding overnight. Orders for FFP, platelets, and PRBC. Per Dr. [name], ok to restart   and target lower heparin goal 0.3-0.4 with increased bleeding. -heparin level= 0.16 (heparin rate 1250 units/hr) -aPTT= 53 (goal 66-75)  Heparin was off for some time today due to line placement (restarted at 2:30pm)    Goal of Therapy:  Heparin level 0.3-0.4 units/ml >> targeting lower goal with bleed Monitor platelets by anticoagulation protocol: Yes   Plan:  -increase heparin to 1350 units/hr -heparin level, aPTT and CBC in am  Hildred Laser, PharmD Clinical Pharmacist **Pharmacist phone directory can now be found on [email or web address] ([email or web address]).  Listed under [place].

## 2019-08-11 NOTE — Progress Notes (Signed)
Patient ID: [name], male   DOB: 05/07/1976, 44 y.o.   MRN: [id number]    Advanced Heart Failure Rounding Note   Subjective:    Remains on VV ecmo with good flows and oxygen saturation 94%. No pressors, stable to high SBP but drops flow and oxygen saturation significantly with volume variation.  Had chugging episodes x 2 last night, resolved with albumin and PRBCs.  On Dilaudid, Propofol, Versed for sedation.   Still with significant bleeding around CVL and right chest tube, bleeding around trach resolved. Lactate 1.8.   CXR: Bilateral lung opacities c/w ARDS   Platelets 76=>71=>62=>51=>48 => 54 => 44, LDH 562=>538=>444=>394 => 316 => 298 => 271.     Afebrile, off abx.   On vent 50% PEEP 10. Bilateral CTs in for PTXs  ECMO parameters Flow 5.2 RPM 3500 dP 16 Pressure -93   Objective:   Weight Range:  Vital Signs:   Temp:  [97.3 F (36.3 C)-98.1 F (36.7 C)] 97.3 F (36.3 C) (01/03 0130) Pulse Rate:  [55-78] 63 (01/03 0700) Resp:  [0-30] 11 (01/03 0700) BP: (152-153)/(43-49) 153/43 (01/02 1200) SpO2:  [82 %-100 %] 82 % (01/03 0700) Arterial Line BP: (134-186)/(11-64) 186/64 (01/03 0700) FiO2 (%):  [50 %-100 %] 100 % (01/03 0500) Last BM Date: 08/10/19  Weight change: Filed Weights   08/07/19 0421 08/08/19 0300 08/09/19 0500  Weight: 122 kg 125.4 kg 129.7 kg    Intake/Output:   Intake/Output Summary (Last 24 hours) at 08/11/2019 0751 Last data filed at 08/11/2019 0700 Gross per 24 hour  Intake 2291.66 ml  Output 2930 ml  Net -638.34 ml     Physical Exam: General: Sedated/intubated Neck: Thick, JVP difficult, no thyromegaly or thyroid nodule.  Lungs: Decreased at bases.  CV: Nonpalpable PMI.  Heart regular S1/S2, no S3/S4, no murmur.  1+ edema to knees bilaterally.  Abdomen: Soft, nontender, no hepatosplenomegaly, no distention.  Skin: Intact without lesions or rashes.  Neurologic: Sedated Extremities: No clubbing or cyanosis.  HEENT: Normal.    Telemetry: Sinus 70-80s Personally reviewed  Labs: Basic Metabolic Panel: Recent Labs  Lab 08/04/19 1644 08/04/19 1651 08/09/19 0453 08/09/19 1635 08/10/19 0412 08/10/19 1700 08/10/19 1712 08/10/19 2040 08/11/19 0509 08/11/19 0550  NA 136  --  142 138 140 137 136 135 133* 134*  K 4.7  --  4.0 4.5 4.7 5.1 5.0 4.8 4.5 4.5  CL 108   < > 111 107 108 104  --   --  103  --   CO2 24   < > 25 24 26 26   --   --  25  --   GLUCOSE 167*   < > 137* 180* 161* 179*  --   --  149*  --   BUN 39*   < > 35* 34* 35* 32*  --   --  27*  --   CREATININE 0.74   < > 0.50* 0.52* 0.49* 0.43*  --   --  0.42*  --   CALCIUM 7.5*   < > 8.1* 8.2* 8.3* 8.3*  --   --  7.9*  --   MG 2.8*  --   --   --  2.1  --   --   --   --   --   PHOS 2.7  --   --   --  3.4  --   --   --   --   --    < > = values  in this interval not displayed.    Liver Function Tests: Recent Labs  Lab 08/07/19 0344 08/08/19 0314 08/09/19 0453 08/10/19 0412 08/11/19 0509  AST 18 62* 32 22 22  ALT 19 95* 92* 67* 44  ALKPHOS 32* 45 43 36* 37*  BILITOT 0.9 0.9 1.0 0.8 0.7  PROT 4.8* 4.6* 4.5* 4.7* 4.3*  ALBUMIN 3.1* 3.2* 3.2* 3.3* 2.9*   No results for input(s): LIPASE, AMYLASE in the last 168 hours. No results for input(s): AMMONIA in the last 168 hours.  CBC: Recent Labs  Lab 08/07/19 2157 08/08/19 0302 08/10/19 0412 08/10/19 1319 08/10/19 1700 08/10/19 1712 08/10/19 2040 08/11/19 0007 08/11/19 0509 08/11/19 0550  WBC 7.2   < > 10.3 9.4 9.0  --   --  9.2 9.1  --   NEUTROABS 4.7  --   --   --   --   --   --   --   --   --   HGB 8.1*  --  9.4* 8.4* 8.1* 7.8* 7.1* 7.3* 7.7* 7.1*  HCT 25.8*  --  28.6* 25.7* 24.8* 23.0* 21.0* 22.8* 22.9* 21.0*  MCV 90.5   < > 91.4 93.1 91.5  --   --  93.1 91.2  --   PLT 55*   < > 54* 47* 47*  --   --  44* 44*  --    < > = values in this interval not displayed.    Cardiac Enzymes: No results for input(s): CKTOTAL, CKMB, CKMBINDEX, TROPONINI in the last 168 hours.  BNP: BNP (last  3 results) Recent Labs    07/22/19 1039  BNP 15.3    ProBNP (last 3 results) No results for input(s): PROBNP in the last 8760 hours.    Other results:  Imaging: DG CHEST PORT 1 VIEW  Result Date: 08/10/2019 CLINICAL DATA:  Central line placement EXAM: PORTABLE CHEST 1 VIEW COMPARISON:  08/10/2019, 5:21 a.m. FINDINGS: No change in appearance or position of a left neck vascular catheter, tip positioned near the confluence of the superior vena cava. Redemonstrated inferior approach ECMO cannula, tip projecting in the expected vicinity of the right atrium. Support apparatus including tracheostomy and bilateral chest tubes is otherwise unchanged. There may be slight interval improvement in aeration of the bilateral lungs, with generally very dense, extensive bilateral consolidation. The cardiac borders are obscured. IMPRESSION: 1. No change in appearance or position of a left neck vascular catheter, tip positioned near the confluence of the superior vena cava. 2. Redemonstrated inferior approach ECMO cannula, tip projecting in the expected vicinity of the right atrium. 3. There may be slight interval improvement in aeration of the bilateral lungs, with generally very dense, extensive bilateral consolidation. Electronically Signed   By: Eddie Candle M.D.   On: 08/10/2019 19:42   DG Chest Port 1 View  Result Date: 08/10/2019 CLINICAL DATA:  Acute respiratory failure with hypoxia. EXAM: PORTABLE CHEST 1 VIEW COMPARISON:  August 09, 2019. FINDINGS: Endotracheal and feeding tubes are unchanged in position. Left internal jugular catheter is unchanged. Bilateral chest tubes are noted without pneumothorax. There remains diffuse opacification of both hemi thoraces consistent with pneumonia or atelectasis and associated effusions. Bony thorax is unremarkable. IMPRESSION: Stable support apparatus. Stable bilateral chest tubes without pneumothorax. Stable bilateral lung opacities are noted concerning for pneumonia  or atelectasis with associated pleural effusions. Electronically Signed   By: Marijo Conception M.D.   On: 08/10/2019 08:19   DG CHEST PORT 1 VIEW  Result  Date: 08/09/2019 CLINICAL DATA:  Complications of chest tube. EXAM: PORTABLE CHEST 1 VIEW COMPARISON:  Radiograph yesterday. FINDINGS: Tracheostomy tube tip at the thoracic inlet. Enteric tube tip below the diaphragm not included in the field of view. Left central line remains in place. Bilateral ECMO catheters from an inferior approach. Bilateral chest tubes. Progressive opacification of the right hemithorax may represent pleural effusions, with confluent apical on right midlung density in addition to basilar opacity. Underlying diffuse heterogeneous airspace disease. No obvious pneumothorax. IMPRESSION: 1. Progressive opacification of the right hemithorax may represent partially loculated pleural fluid versus progressive airspace disease. 2. Otherwise diffuse bilateral opacification of both hemi thoraces, not significantly changed. 3. Bilateral chest tubes in place. No obvious pneumothorax. Support apparatus unchanged. Electronically Signed   By: Keith Rake M.D.   On: 08/09/2019 22:55   [place] EKG SITE RITE  Result Date: 08/11/2019 If Site Rite image not attached, placement could not be confirmed due to current cardiac rhythm.    Medications:     Scheduled Medications: . sodium chloride   Intravenous Once  . artificial tears  1 application Both Eyes C6C  . vitamin C  500 mg Per Tube Daily  . chlorhexidine gluconate (MEDLINE KIT)  15 mL Mouth Rinse BID  . Chlorhexidine Gluconate Cloth  6 each Topical Daily  . clonazePAM  2 mg Per Tube BID  . dexamethasone (DECADRON) injection  3 mg Intravenous Q24H   Followed by  . [START ON 08/13/2019] dexamethasone (DECADRON) injection  1 mg Intravenous Q24H  . feeding supplement (PRO-STAT SUGAR FREE 64)  30 mL Per Tube BID  . free water  400 mL Per Tube Q8H  . gabapentin  300 mg Per Tube Q8H  . insulin  aspart  1 Units Subcutaneous Q4H  . insulin aspart  3-9 Units Subcutaneous Q4H  . magnesium hydroxide  15 mL Oral Daily  . mouth rinse  15 mL Mouth Rinse 10 times per day  . oxyCODONE  10 mg Per Tube Q6H  . pantoprazole (PROTONIX) IV  40 mg Intravenous QHS  . QUEtiapine  25 mg Per Tube BID  . senna-docusate  1 tablet Per Tube BID  . sodium chloride flush  10-40 mL Intracatheter Q12H  . Thrombi-Pad  1 each Topical Once  . zinc sulfate  220 mg Per Tube Daily    Infusions: . sodium chloride    . sodium chloride Stopped (08/08/19 1922)  . albumin human 25 g (08/11/19 0132)  . cisatracurium (NIMBEX) infusion    . dextrose    . feeding supplement (PIVOT 1.5 CAL) 1,000 mL (08/10/19 1452)  . heparin 1,450 Units/hr (08/11/19 0435)  . HYDROmorphone 2 mg/hr (08/11/19 0700)  . midazolam (VERSED) infusion 16 mg/hr (08/11/19 0700)  . propofol (DIPRIVAN) infusion 60 mcg/kg/min (08/11/19 0700)    PRN Medications: Place/Maintain arterial line **AND** sodium chloride, sodium chloride, acetaminophen, albumin human, dextrose, hydrALAZINE, HYDROmorphone, labetalol, metoprolol tartrate, midazolam, ondansetron (ZOFRAN) IV, sodium chloride, sodium chloride flush   Assessment/Plan:    1. Acute hypoxic/hypercarbic respiratory failure due to COVID PNA: Remained markedly hypoxic despite full support. Intubated 12/26. S/p bilateral CTs 12/26 for pneumothoraces.  TEE on 12/26 LVEF 70% RV ok. VV ECMO begun 12/26. Day #7 VV ECMO. Tracheostomy 12/29.  COVID ARDS at this point.  Flows stable currently but drops periodically with attempted diuresis and also blood loss from trach, chest tubes, CVL. Had chugging x 2 overnight resolved with albumin/blood.  Oxygen saturation stable, good color change between cannulas,  suspect level of mixing acceptable.  Plts low but LDH also lower today and not suggestive of significant hemolysis.  - Decrease heparin gtt this morning to 1250 U/hr with bleeding around CVL and chest  tube.  Can increase back to 1450 after CVL removed/PICC in place.  - Have had significant problems with CVL, will place PICC this morning and remove CVL.  2. Bilateral PTX: Due to barotrauma.  3. COVID PNA: management of COVID infection per CCM. CXR with bilateral multifocal PNA progressing to likely ARDS.  - s/p 10 days of remdesivir - 12/16, 12/2 s/p tociluzimab - 12/17 s/p convalescent plasma - On decadron - Off abx.   4. ID: Empiric coverage with vancomycin/cefepime for possible secondary bacterial PNA, course completed.   5. Thrombocytopenia: Slow decrease in platelets, likely low level hemolysis + critical illness.  - Will give 1 unit plts this morning.  6. Anemia: Bleeding at trach site resolved but still bleeding at right chest tube site and CVL.   - Transfuse hgb < 8 => 1 unit PRBCs now.  - Have lowered goal heparin level to 0.3-0.4.  CBC at noon.   Length of Stay: 20   CRITICAL CARE Performed by: Loralie Champagne  Total critical care time: 40 minutes  Critical care time was exclusive of separately billable procedures and treating other patients.  Critical care was necessary to treat or prevent imminent or life-threatening deterioration.  Critical care was time spent personally by me on the following activities: development of treatment plan with patient and/or surrogate as well as nursing, discussions with consultants, evaluation of patient's response to treatment, examination of patient, obtaining history from patient or surrogate, ordering and performing treatments and interventions, ordering and review of laboratory studies, ordering and review of radiographic studies, pulse oximetry and re-evaluation of patient's condition.    Loralie Champagne MD 08/11/2019, 7:51 AM  Advanced Heart Failure Team Pager [phone] (M-F; 7a - 4p)  Please contact [facility] for night-coverage after hours (4p -7a ) and weekends on [email or web address]

## 2019-08-11 NOTE — Progress Notes (Signed)
Daily Progress Note   Patient Name: [name]       Date: 08/11/2019 DOB: 1976/02/13  Age: 44 y.o. MRN#: [id number] Attending Physician: Wonda Olds, MD Primary Care Physician: Tamsen Roers, MD Admit Date: 07/22/2019  Reason for Consultation/Follow-up: Establishing goals of care  Subjective: Met with patient's spouse and mother in person and several other family members via zoom.  Reviewed their list of questions with them. Discussed purpose of ECMO and all possible outcomes and complications. Discussed possible reasons that ECMO may need to be stopped. Reviewed patient's bleeding issues with family.  Gave emotional support and family processed how truly ill the patient is.  Reinforced "hoping for best, preparing for worst".  Current goals of care are to continue full support until it is obvious that patient has irreversible lung damage and will not recover. Current research showing patients with COVID related ARDS on ECMO require multiple weeks on ECMO before evidence of recovery. Discussed related statistics- per ECMO registry- current information shows mortality rate around 45-50% for COVID patients on ECMO. This is increased for patients with comorbidities including diabetes.   Gave emotional support as [name]- patient's spouse- shared about her son's close relationship with his Dad. They had grown even closer prior to this incident due to [name]'s work schedule changing and thus their son spending more time with his Dad. Their son is 29 and has autism. Discussed concerns about relaying information regarding his Dad's status and her worries about his reaction if he dies. Hospice grief counseling was recommended for assistance.   ROS  Length of Stay: 20  Current  Medications: Scheduled Meds:  . artificial tears  1 application Both Eyes Z0C  . vitamin C  500 mg Per Tube Daily  . chlorhexidine gluconate (MEDLINE KIT)  15 mL Mouth Rinse BID  . Chlorhexidine Gluconate Cloth  6 each Topical Daily  . clonazePAM  2 mg Per Tube BID  . dexamethasone (DECADRON) injection  3 mg Intravenous Q24H   Followed by  . [START ON 08/13/2019] dexamethasone (DECADRON) injection  1 mg Intravenous Q24H  . feeding supplement (PRO-STAT SUGAR FREE 64)  30 mL Per Tube BID  . free water  400 mL Per Tube Q8H  . gabapentin  300 mg Per Tube Q8H  . insulin aspart  1 Units Subcutaneous  Q4H  . insulin aspart  3-9 Units Subcutaneous Q4H  . magnesium hydroxide  15 mL Oral Daily  . mouth rinse  15 mL Mouth Rinse 10 times per day  . oxyCODONE  10 mg Per Tube Q6H  . pantoprazole (PROTONIX) IV  40 mg Intravenous QHS  . QUEtiapine  25 mg Per Tube BID  . senna-docusate  1 tablet Per Tube BID  . sodium chloride flush  10-40 mL Intracatheter Q12H  . sodium chloride flush  10-40 mL Intracatheter Q12H  . Thrombi-Pad  1 each Topical Once  . zinc sulfate  220 mg Per Tube Daily    Continuous Infusions: . sodium chloride    . sodium chloride Stopped (08/08/19 1922)  . albumin human 25 g (08/11/19 1240)  . cisatracurium (NIMBEX) infusion    . dextrose    . feeding supplement (PIVOT 1.5 CAL) 1,000 mL (08/10/19 1452)  . heparin 1,250 Units/hr (08/11/19 1430)  . HYDROmorphone 4 mg/hr (08/11/19 1442)  . midazolam (VERSED) infusion 16 mg/hr (08/11/19 1100)  . propofol (DIPRIVAN) infusion 50 mcg/kg/min (08/11/19 1200)    PRN Meds: Place/Maintain arterial line **AND** sodium chloride, sodium chloride, acetaminophen, albumin human, dextrose, hydrALAZINE, HYDROmorphone, labetalol, metoprolol tartrate, midazolam, ondansetron (ZOFRAN) IV, sodium chloride, sodium chloride flush, sodium chloride flush  Physical Exam Vitals and nursing note reviewed.  Constitutional:      Appearance: He is  ill-appearing.             Vital Signs: BP (!) 171/57   Pulse 69   Temp (!) 97.3 F (36.3 C) (Core)   Resp 11   Ht _0  (1.727 m)   Wt 129.7 kg   SpO2 95%   BMI 43.48 kg/m  SpO2: SpO2: 95 % O2 Device: O2 Device: Ventilator O2 Flow Rate: O2 Flow Rate (L/min): 45 L/min(45L 15L NRB)  Intake/output summary:   Intake/Output Summary (Last 24 hours) at 08/11/2019 1548 Last data filed at 08/11/2019 1100 Gross per 24 hour  Intake 2775.11 ml  Output 2020 ml  Net 755.11 ml   LBM: Last BM Date: 08/10/19 Baseline Weight: Weight: 136.1 kg Most recent weight: Weight: 129.7 kg       Palliative Assessment/Data: PPS: 10%      Patient Active Problem List   Diagnosis Date Noted  . Personal history of ECMO   . Advanced care planning/counseling discussion   . Acute respiratory distress syndrome (ARDS) due to COVID-19 virus ([place])   . Palliative care by specialist   . Goals of care, counseling/discussion   . Advanced directives, counseling/discussion   . Subcutaneous crepitus   . Pneumonia due to COVID-19 virus 07/23/2019  . Acute respiratory failure with hypoxia ([place]) 07/22/2019  . Persistent asthma with undetermined severity   . Thrombocytopenia ([place])   . Leukopenia   . Fever   . Gram positive bacterial infection   . Septic arthritis of right acromioclavicular joint ([place]) 09/29/2017  . Chronic left-sided low back pain with left-sided sciatica 09/19/2017  . Epigastric pain   . Pancreatitis 04/17/2017  . Essential hypertension 04/17/2017  . Moderate persistent asthma 07/21/2015  . Allergic rhinoconjunctivitis 07/21/2015  . GERD (gastroesophageal reflux disease) 07/21/2015  . ABNORMALITY OF GAIT 11/12/2009  . TARSAL TUNNEL SYNDROME, LEFT 10/08/2009  . PES PLANUS 10/08/2009    Palliative Care Assessment & Plan   Patient Profile: 44 y/o M admitted 12/14 with COVIDpneumonia causing acutehypoxemic respiratory failure. Developed pneumomediastinum 12/23 with concern for possible  bilateral small pneumothoraces on 12/24. PCCM consulted  for evaluation of pneumothoraces. During admission patient has had placement of bilateral chest tubes, and is now on VV ECMO, and vent by trach. Recovery has been complicated by ongoing bleeding- frank blood in chest tubes, bleeding from around trach site, epistaxis- has received multiple blood transfusions, platelet transfusion. Also received albumin for volume expansion due to low flow after receiving diuresis- diuresis now on hold.  Palliative medicine consulted for support and GOC.   Assessment/Recommendations/Plan  GOC- continue full scope care until there is evidence of irreversible recovery or complication that would lead to ECMO needing to be stopped Family is now aware of patient's critical tenuous state and possibility for poor outcomes, but remain hopeful that given time on ECMO his lungs will be able to recover and he will not experience further complications PMT will continue to follow and provide intermittent support to family  Goals of Care and Additional Recommendations: Limitations on Scope of Treatment: Full Scope Treatment  Code Status: Full code  Prognosis:  Unable to determine  Discharge Planning: To Be Determined  Care plan was discussed with patient's spouse, mother, and multiple other family members  Thank you for allowing the Palliative Medicine Team to assist in the care of this patient.   Time In: 1400 Time Out: 1600 Total Time 120 minutes Prolonged Time Billed yes      Greater than 50%  of this time was spent counseling and coordinating care related to the above assessment and plan.  Mariana Kaufman, AGNP-C Palliative Medicine   Please contact Palliative Medicine Team phone at [phone] for questions and concerns.

## 2019-08-11 NOTE — Progress Notes (Signed)
Peripherally Inserted Central Catheter/Midline Placement  The IV Nurse has discussed with the patient and/or persons authorized to consent for the patient, the purpose of this procedure and the potential benefits and risks involved with this procedure.  The benefits include less needle sticks, lab draws from the catheter, and the patient may be discharged home with the catheter. Risks include, but not limited to, infection, bleeding, blood clot (thrombus formation), and puncture of an artery; nerve damage and irregular heartbeat and possibility to perform a PICC exchange if needed/ordered by physician.  Alternatives to this procedure were also discussed.  Bard Power PICC patient education guide, fact sheet on infection prevention and patient information card has been provided to patient /or left at bedside.    PICC/Midline Placement Documentation  PICC Double Lumen 46/50/35 PICC Right Basilic 40 cm 0 cm (Active)  Indication for Insertion or Continuance of Line Prolonged intravenous therapies 08/11/19 1204  Exposed Catheter (cm) 0 cm 08/11/19 1204  Site Assessment Clean;Dry;Intact 08/11/19 1204  Lumen #1 Status Flushed;Blood return noted 08/11/19 1204  Lumen #2 Status Flushed;No blood return;Saline locked 08/11/19 1204  Dressing Type Transparent 08/11/19 1204  Dressing Status Clean;Dry;Intact;Antimicrobial disc in place 08/11/19 1204       Valentina Shaggy Ramos 08/11/2019, 12:07 PM

## 2019-08-12 ENCOUNTER — Inpatient Hospital Stay (HOSPITAL_COMMUNITY): Payer: Managed Care, Other (non HMO)

## 2019-08-12 LAB — APTT
aPTT: 53 seconds — ABNORMAL HIGH (ref 24–36)
aPTT: 59 seconds — ABNORMAL HIGH (ref 24–36)
aPTT: 48 seconds — ABNORMAL HIGH (ref 24–36)

## 2019-08-12 LAB — POCT I-STAT 7, (LYTES, BLD GAS, ICA,H+H)
Acid-Base Excess: 2 mmol/L (ref 0.0–2.0)
Acid-Base Excess: 2 mmol/L (ref 0.0–2.0)
Acid-Base Excess: 3 mmol/L — ABNORMAL HIGH (ref 0.0–2.0)
Acid-Base Excess: 2 mmol/L (ref 0.0–2.0)
Acid-Base Excess: 1 mmol/L (ref 0.0–2.0)
Acid-Base Excess: 1 mmol/L (ref 0.0–2.0)
Acid-Base Excess: 2 mmol/L (ref 0.0–2.0)
Bicarbonate: 26.8 mmol/L (ref 20.0–28.0)
Bicarbonate: 26.9 mmol/L (ref 20.0–28.0)
Bicarbonate: 28.4 mmol/L — ABNORMAL HIGH (ref 20.0–28.0)
Bicarbonate: 26.8 mmol/L (ref 20.0–28.0)
Bicarbonate: 26 mmol/L (ref 20.0–28.0)
Bicarbonate: 25.9 mmol/L (ref 20.0–28.0)
Bicarbonate: 26 mmol/L (ref 20.0–28.0)
Calcium, Ion: 1.22 mmol/L (ref 1.15–1.40)
Calcium, Ion: 1.27 mmol/L (ref 1.15–1.40)
Calcium, Ion: 1.24 mmol/L (ref 1.15–1.40)
Calcium, Ion: 1.28 mmol/L (ref 1.15–1.40)
Calcium, Ion: 1.23 mmol/L (ref 1.15–1.40)
Calcium, Ion: 1.23 mmol/L (ref 1.15–1.40)
Calcium, Ion: 1.27 mmol/L (ref 1.15–1.40)
HCT: 27 % — ABNORMAL LOW (ref 39.0–52.0)
HCT: 28 % — ABNORMAL LOW (ref 39.0–52.0)
HCT: 28 % — ABNORMAL LOW (ref 39.0–52.0)
HCT: 27 % — ABNORMAL LOW (ref 39.0–52.0)
HCT: 27 % — ABNORMAL LOW (ref 39.0–52.0)
HCT: 29 % — ABNORMAL LOW (ref 39.0–52.0)
HCT: 27 % — ABNORMAL LOW (ref 39.0–52.0)
Hemoglobin: 9.2 g/dL — ABNORMAL LOW (ref 13.0–17.0)
Hemoglobin: 9.5 g/dL — ABNORMAL LOW (ref 13.0–17.0)
Hemoglobin: 9.5 g/dL — ABNORMAL LOW (ref 13.0–17.0)
Hemoglobin: 9.2 g/dL — ABNORMAL LOW (ref 13.0–17.0)
Hemoglobin: 9.2 g/dL — ABNORMAL LOW (ref 13.0–17.0)
Hemoglobin: 9.9 g/dL — ABNORMAL LOW (ref 13.0–17.0)
Hemoglobin: 9.2 g/dL — ABNORMAL LOW (ref 13.0–17.0)
O2 Saturation: 87 %
O2 Saturation: 83 %
O2 Saturation: 100 %
O2 Saturation: 84 %
O2 Saturation: 85 %
O2 Saturation: 88 %
O2 Saturation: 91 %
Patient temperature: 36.3
Patient temperature: 36.4
Patient temperature: 36.5
Patient temperature: 98.8
Patient temperature: 98.6
Patient temperature: 98.4
Patient temperature: 97.7
Potassium: 4.4 mmol/L (ref 3.5–5.1)
Potassium: 4.5 mmol/L (ref 3.5–5.1)
Potassium: 4.3 mmol/L (ref 3.5–5.1)
Potassium: 4.1 mmol/L (ref 3.5–5.1)
Potassium: 4.7 mmol/L (ref 3.5–5.1)
Potassium: 4.9 mmol/L (ref 3.5–5.1)
Potassium: 4.9 mmol/L (ref 3.5–5.1)
Sodium: 141 mmol/L (ref 135–145)
Sodium: 142 mmol/L (ref 135–145)
Sodium: 141 mmol/L (ref 135–145)
Sodium: 139 mmol/L (ref 135–145)
Sodium: 141 mmol/L (ref 135–145)
Sodium: 142 mmol/L (ref 135–145)
Sodium: 143 mmol/L (ref 135–145)
TCO2: 28 mmol/L (ref 22–32)
TCO2: 28 mmol/L (ref 22–32)
TCO2: 30 mmol/L (ref 22–32)
TCO2: 28 mmol/L (ref 22–32)
TCO2: 27 mmol/L (ref 22–32)
TCO2: 27 mmol/L (ref 22–32)
TCO2: 27 mmol/L (ref 22–32)
pCO2 arterial: 42.1 mmHg (ref 32.0–48.0)
pCO2 arterial: 42.6 mmHg (ref 32.0–48.0)
pCO2 arterial: 42.6 mmHg (ref 32.0–48.0)
pCO2 arterial: 44.2 mmHg (ref 32.0–48.0)
pCO2 arterial: 43.6 mmHg (ref 32.0–48.0)
pCO2 arterial: 41.1 mmHg (ref 32.0–48.0)
pCO2 arterial: 36.7 mmHg (ref 32.0–48.0)
pH, Arterial: 7.409 (ref 7.350–7.450)
pH, Arterial: 7.405 (ref 7.350–7.450)
pH, Arterial: 7.429 (ref 7.350–7.450)
pH, Arterial: 7.391 (ref 7.350–7.450)
pH, Arterial: 7.385 (ref 7.350–7.450)
pH, Arterial: 7.408 (ref 7.350–7.450)
pH, Arterial: 7.457 — ABNORMAL HIGH (ref 7.350–7.450)
pO2, Arterial: 51 mmHg — ABNORMAL LOW (ref 83.0–108.0)
pO2, Arterial: 46 mmHg — ABNORMAL LOW (ref 83.0–108.0)
pO2, Arterial: 369 mmHg — ABNORMAL HIGH (ref 83.0–108.0)
pO2, Arterial: 49 mmHg — ABNORMAL LOW (ref 83.0–108.0)
pO2, Arterial: 51 mmHg — ABNORMAL LOW (ref 83.0–108.0)
pO2, Arterial: 54 mmHg — ABNORMAL LOW (ref 83.0–108.0)
pO2, Arterial: 56 mmHg — ABNORMAL LOW (ref 83.0–108.0)

## 2019-08-12 LAB — LACTIC ACID, PLASMA: Lactic Acid, Venous: 1.9 mmol/L (ref 0.5–1.9)

## 2019-08-12 LAB — HEPATIC FUNCTION PANEL
ALT: 38 U/L (ref 0–44)
AST: 24 U/L (ref 15–41)
Albumin: 3.7 g/dL (ref 3.5–5.0)
Alkaline Phosphatase: 37 U/L — ABNORMAL LOW (ref 38–126)
Bilirubin, Direct: 0.2 mg/dL (ref 0.0–0.2)
Indirect Bilirubin: 0.6 mg/dL (ref 0.3–0.9)
Total Bilirubin: 0.8 mg/dL (ref 0.3–1.2)
Total Protein: 5.1 g/dL — ABNORMAL LOW (ref 6.5–8.1)

## 2019-08-12 LAB — BASIC METABOLIC PANEL
Anion gap: 8 (ref 5–15)
Anion gap: 8 (ref 5–15)
BUN: 23 mg/dL — ABNORMAL HIGH (ref 6–20)
BUN: 24 mg/dL — ABNORMAL HIGH (ref 6–20)
CO2: 27 mmol/L (ref 22–32)
CO2: 25 mmol/L (ref 22–32)
Calcium: 8.6 mg/dL — ABNORMAL LOW (ref 8.9–10.3)
Calcium: 8.8 mg/dL — ABNORMAL LOW (ref 8.9–10.3)
Chloride: 106 mmol/L (ref 98–111)
Chloride: 109 mmol/L (ref 98–111)
Creatinine, Ser: 0.49 mg/dL — ABNORMAL LOW (ref 0.61–1.24)
Creatinine, Ser: 0.61 mg/dL (ref 0.61–1.24)
GFR calc Af Amer: >60 mL/min (ref >60)
GFR calc Af Amer: >60 mL/min (ref >60)
GFR calc non Af Amer: >60 mL/min (ref >60)
GFR calc non Af Amer: >60 mL/min (ref >60)
Glucose, Bld: 145 mg/dL — ABNORMAL HIGH (ref 70–99)
Glucose, Bld: 233 mg/dL — ABNORMAL HIGH (ref 70–99)
Potassium: 4.3 mmol/L (ref 3.5–5.1)
Potassium: 4.9 mmol/L (ref 3.5–5.1)
Sodium: 141 mmol/L (ref 135–145)
Sodium: 142 mmol/L (ref 135–145)

## 2019-08-12 LAB — BPAM PLATELET PHERESIS
Blood Product Expiration Date: 202101042359
Blood Product Expiration Date: 202101042359
Blood Product Expiration Date: 202101052359
Blood Product Expiration Date: 202101052359
ISSUE DATE / TIME: 202101030836
ISSUE DATE / TIME: 202101031438
ISSUE DATE / TIME: 202101031650
ISSUE DATE / TIME: 202101032252
Unit Type and Rh: 7300
Unit Type and Rh: 600
Unit Type and Rh: 6200
Unit Type and Rh: 7300

## 2019-08-12 LAB — HEPARIN LEVEL (UNFRACTIONATED)
Heparin Unfractionated: 0.17 IU/mL — ABNORMAL LOW (ref 0.30–0.70)
Heparin Unfractionated: <0.1 IU/mL — ABNORMAL LOW (ref 0.30–0.70)

## 2019-08-12 LAB — PREPARE CRYOPRECIPITATE: Unit division: 0

## 2019-08-12 LAB — CBC
HCT: 29.3 % — ABNORMAL LOW (ref 39.0–52.0)
HCT: 28.9 % — ABNORMAL LOW (ref 39.0–52.0)
Hemoglobin: 9.9 g/dL — ABNORMAL LOW (ref 13.0–17.0)
Hemoglobin: 9.6 g/dL — ABNORMAL LOW (ref 13.0–17.0)
MCH: 30.6 pg (ref 26.0–34.0)
MCH: 30.8 pg (ref 26.0–34.0)
MCHC: 33.8 g/dL (ref 30.0–36.0)
MCHC: 33.2 g/dL (ref 30.0–36.0)
MCV: 90.4 fL (ref 80.0–100.0)
MCV: 92.6 fL (ref 80.0–100.0)
Platelets: 71 10*3/uL — ABNORMAL LOW (ref 150–400)
Platelets: 52 10*3/uL — ABNORMAL LOW (ref 150–400)
RBC: 3.24 MIL/uL — ABNORMAL LOW (ref 4.22–5.81)
RBC: 3.12 MIL/uL — ABNORMAL LOW (ref 4.22–5.81)
RDW: 17.1 % — ABNORMAL HIGH (ref 11.5–15.5)
RDW: 17.9 % — ABNORMAL HIGH (ref 11.5–15.5)
WBC: 10.1 10*3/uL (ref 4.0–10.5)
WBC: 9.6 10*3/uL (ref 4.0–10.5)
nRBC: 2.7 % — ABNORMAL HIGH (ref 0.0–0.2)
nRBC: 2.8 % — ABNORMAL HIGH (ref 0.0–0.2)

## 2019-08-12 LAB — LACTATE DEHYDROGENASE
LDH: 361 U/L — ABNORMAL HIGH (ref 98–192)
LDH: 325 U/L — ABNORMAL HIGH (ref 98–192)

## 2019-08-12 LAB — PROTIME-INR
INR: 1.2 (ref 0.8–1.2)
Prothrombin Time: 14.9 seconds (ref 11.4–15.2)

## 2019-08-12 LAB — PREPARE PLATELET PHERESIS
Unit division: 0
Unit division: 0
Unit division: 0
Unit division: 0

## 2019-08-12 LAB — FIBRINOGEN: Fibrinogen: 166 mg/dL — ABNORMAL LOW (ref 210–475)

## 2019-08-12 LAB — BPAM CRYOPRECIPITATE
Blood Product Expiration Date: 202101031955
ISSUE DATE / TIME: 202101031438
Unit Type and Rh: 5100

## 2019-08-12 LAB — GLUCOSE, CAPILLARY
Glucose-Capillary: 139 mg/dL — ABNORMAL HIGH (ref 70–99)
Glucose-Capillary: 143 mg/dL — ABNORMAL HIGH (ref 70–99)
Glucose-Capillary: 153 mg/dL — ABNORMAL HIGH (ref 70–99)
Glucose-Capillary: 194 mg/dL — ABNORMAL HIGH (ref 70–99)
Glucose-Capillary: 200 mg/dL — ABNORMAL HIGH (ref 70–99)
Glucose-Capillary: 200 mg/dL — ABNORMAL HIGH (ref 70–99)

## 2019-08-12 LAB — BODY FLUID CELL COUNT WITH DIFFERENTIAL
Eos, Fluid: 1 %
Lymphs, Fluid: 13 %
Monocyte-Macrophage-Serous Fluid: 12 % — ABNORMAL LOW (ref 50–90)
Neutrophil Count, Fluid: 74 % — ABNORMAL HIGH (ref 0–25)
Total Nucleated Cell Count, Fluid: 6100 cu mm — ABNORMAL HIGH (ref 0–1000)

## 2019-08-12 LAB — COOXEMETRY PANEL
Carboxyhemoglobin: 2.9 % — ABNORMAL HIGH (ref 0.5–1.5)
Methemoglobin: 1.4 % (ref 0.0–1.5)
O2 Saturation: 69.3 %
Total hemoglobin: 9.9 g/dL — ABNORMAL LOW (ref 12.0–16.0)

## 2019-08-12 LAB — TRIGLYCERIDES: Triglycerides: 240 mg/dL — ABNORMAL HIGH (ref <150)

## 2019-08-12 MED ORDER — CISATRACURIUM BOLUS VIA INFUSION
0.1000 mg/kg | Freq: Once | INTRAVENOUS | Status: AC
  Administered 2019-08-12: 13 mg via INTRAVENOUS
  Filled 2019-08-12: qty 13

## 2019-08-12 MED ORDER — SODIUM CHLORIDE 0.9 % IV SOLN
0.0600 mg/kg/h | INTRAVENOUS | Status: DC
  Administered 2019-08-12: 0.05 mg/kg/h via INTRAVENOUS
  Administered 2019-08-13: 0.06 mg/kg/h via INTRAVENOUS
  Filled 2019-08-12 (×4): qty 250

## 2019-08-12 MED ORDER — QUETIAPINE FUMARATE 50 MG PO TABS
50.0000 mg | ORAL_TABLET | Freq: Two times a day (BID) | ORAL | Status: DC
  Administered 2019-08-12 – 2019-08-15 (×7): 50 mg
  Filled 2019-08-12 (×8): qty 1

## 2019-08-12 MED ORDER — SODIUM CHLORIDE 0.9 % IV SOLN
1.0000 g | INTRAVENOUS | Status: DC
  Administered 2019-08-12: 11:00:00 1 g via INTRAVENOUS
  Filled 2019-08-12: qty 1

## 2019-08-12 MED ORDER — ROCURONIUM BROMIDE 50 MG/5ML IV SOLN
80.0000 mg | Freq: Once | INTRAVENOUS | Status: AC
  Administered 2019-08-12: 80 mg via INTRAVENOUS

## 2019-08-12 MED ORDER — CLONAZEPAM 1 MG PO TABS
4.0000 mg | ORAL_TABLET | Freq: Two times a day (BID) | ORAL | Status: DC
  Administered 2019-08-12 – 2019-08-14 (×4): 4 mg
  Filled 2019-08-12 (×4): qty 4

## 2019-08-12 MED ORDER — ALBUMIN HUMAN 5 % IV SOLN
INTRAVENOUS | Status: AC
  Administered 2019-08-12: 25 g via INTRAVENOUS
  Filled 2019-08-12: qty 250

## 2019-08-12 MED ORDER — SODIUM CHLORIDE 0.9% FLUSH: 10.0000 mL | INTRAVENOUS | Status: DC | PRN

## 2019-08-12 MED ORDER — VANCOMYCIN HCL 1250 MG/250ML IV SOLN
1250.0000 mg | Freq: Three times a day (TID) | INTRAVENOUS | Status: DC
  Administered 2019-08-13 – 2019-08-19 (×20): 1250 mg via INTRAVENOUS
  Filled 2019-08-12 (×22): qty 250

## 2019-08-12 MED ORDER — VASOPRESSIN 20 UNIT/ML IV SOLN
0.0300 [IU]/min | INTRAVENOUS | Status: DC
  Administered 2019-08-12 – 2019-08-15 (×2): 0.03 [IU]/min via INTRAVENOUS
  Filled 2019-08-12 (×3): qty 2

## 2019-08-12 MED ORDER — SODIUM CHLORIDE 0.9 % IV SOLN
2.0000 g | Freq: Three times a day (TID) | INTRAVENOUS | Status: AC
  Administered 2019-08-12 – 2019-08-21 (×28): 2 g via INTRAVENOUS
  Filled 2019-08-12 (×29): qty 2

## 2019-08-12 MED ORDER — VANCOMYCIN HCL 10 G IV SOLR
2500.0000 mg | Freq: Once | INTRAVENOUS | Status: AC
  Administered 2019-08-12: 2500 mg via INTRAVENOUS
  Filled 2019-08-12: qty 2500

## 2019-08-12 MED ORDER — SODIUM CHLORIDE 0.9 % IV SOLN
20.0000 ug | Freq: Once | INTRAVENOUS | Status: AC
  Administered 2019-08-12: 20 ug via INTRAVENOUS
  Filled 2019-08-12: qty 5

## 2019-08-12 MED ORDER — OXYMETAZOLINE HCL 0.05 % NA SOLN
1.0000 | Freq: Two times a day (BID) | NASAL | Status: DC
  Administered 2019-08-12 – 2019-09-18 (×63): 1 via NASAL
  Filled 2019-08-12 (×7): qty 30

## 2019-08-12 MED ORDER — OXYCODONE HCL 5 MG PO TABS
15.0000 mg | ORAL_TABLET | Freq: Four times a day (QID) | ORAL | Status: DC
  Administered 2019-08-12 – 2019-08-14 (×9): 15 mg
  Filled 2019-08-12 (×9): qty 3

## 2019-08-12 NOTE — Progress Notes (Signed)
Pharmacy Antibiotic Note  [name] is a 44 y.o. male admitted on 07/22/2019 with ECMO.  Pharmacy has been consulted for cefepime and vancomycin dosing.  78 yom known COVID+, underwent VV ECMO on 12/26.   WBC 10.2, afebrile. Scr 0.49 (normCrCl> 100 mL/min). LA 1.9. BAL showing >100K serratia marcescens and >100 k staphylococcus epidermidis. Remains on ECMO support.   Plan: Vancomycin 2.5 g IV once then 1250 mg IV every 8 hours  Cefepime 2 g IV every 8 hours Monitor renal fx, cx results, clinical pic, and vanc levels as appropriate  Height: _0  (172.7 cm) Weight: 286 lb 2.5 oz (129.8 kg) IBW/kg (Calculated) : 68.4  Temp (24hrs), Avg:97.5 F (36.4 C), Min:97.2 F (36.2 C), Max:98.8 F (37.1 C)  Recent Labs  Lab 08/08/19 0313 08/09/19 0453 08/10/19 0412 08/10/19 1700 08/11/19 0509 08/11/19 0515 08/11/19 1337 08/11/19 1645 08/11/19 2214 08/12/19 0420  WBC  --  10.4 10.3 9.0 9.1  --  9.2 8.8 9.8 10.1  CREATININE  --  0.50* 0.49* 0.43* 0.42*  --   --  0.47*  --  0.49*  LATICACIDVEN 1.5 1.7 1.8  --   --  1.8  --   --   --  1.9    Estimated Creatinine Clearance: 153.4 mL/min (A) (by C-G formula based on SCr of 0.49 mg/dL (L)).    No Known Allergies  Antimicrobials this admission: Remdesivir 12/14>>12/23 Actemra x1 12/16; 12/22 Plasma 12/17 Vanco 12/26 >>12/28, 1/4>> Cefepime 12/26 >>12/30, 1/4>> Dose adjustments this admission: N/A  Microbiology results: 12/14 BCx: ngF 12/14 sputum: Normal respiratory flora  12/25 MRSA PCR neg  12/28 trach: few MSSA 12/28 BCx: ngF 1/2 bronch: >100 k serratia marcescens, >100 k staph epidermidis  Thank you for allowing pharmacy to be a part of this patient's care.  Antonietta Jewel, PharmD, BCCCP Clinical Pharmacist  Phone: [phone]  Please check AMION for all [place] phone numbers After 10:00 PM, call [place] [phone] 08/12/2019 3:45 PM

## 2019-08-12 NOTE — Progress Notes (Addendum)
ECMO Specialist Goal for the shift is to establish and maintain adequate oxygenation via utilization of VV ECMO using appropriate settings and flows to achieve optimal  tissue/organ oxygenation and perfusion. Patient is currently stable at this time and will continue to monitor patient clinical presentation and oxygenation status throughout the night.  Narda Fundora L. Tamala Julian, BS, RRT, RCP

## 2019-08-12 NOTE — Progress Notes (Signed)
ANTICOAGULATION CONSULT NOTE  Pharmacy Consult for bivalirudin Indication: ECMO  No Known Allergies  Patient Measurements: Height: _0  (172.7 cm) Weight: 286 lb 2.5 oz (129.8 kg) IBW/kg (Calculated) : 68.4 Heparin Dosing Weight: 100.7 kg  Vital Signs: Temp: 98.8 F (37.1 C) (01/04 1600) Temp Source: Oral (01/04 1600) BP: 160/59 (01/04 1600) Pulse Rate: 71 (01/04 1700)  Labs: Recent Labs    08/11/19 0509 08/11/19 1645 08/11/19 1649 08/11/19 1649 08/11/19 2033 08/11/19 2113 08/11/19 2214 08/12/19 0420 08/12/19 0933 08/12/19 1321 08/12/19 1327 08/12/19 1616 08/12/19 1640  HGB   < > 8.2*  --    < >  --   --  10.0* 9.9*  --  9.2*  --  9.6* 9.9*  HCT   < > 25.1*  --    < >  --   --  29.8* 29.3*  --  27.0*  --  28.9* 29.0*  PLT   < > 73* 74*  --   --   --  67* 71*  --   --   --  52*  --   APTT  --   --  42*   < > 53*  --   --  53* 59*  --   --  48*  --   LABPROT  --   --  15.7*  --   --  14.8  --  14.9  --   --   --   --   --   INR  --   --  1.3*  --   --  1.2  --  1.2  --   --   --   --   --   HEPARINUNFRC   < > <0.10*  --   --  0.16*  --   --  0.17*  --   --  <0.10*  --   --   CREATININE  --  0.47*  --   --   --   --   --  0.49*  --   --   --  0.61  --    < > = values in this interval not displayed.    Estimated Creatinine Clearance: 153.4 mL/min (by C-G formula based on SCr of 0.61 mg/dL).   Medical History: Past Medical History:  Diagnosis Date  . Asthma   . GERD (gastroesophageal reflux disease)   . Headache   . History of kidney stones    LEFT URETERAL STONE  . HTN (hypertension)   . Pancreatitis 2018   GALLBALDDER SLUDGE CAUSED ISSUED RESOLVED    Medications:  Scheduled:  . artificial tears  1 application Both Eyes U3J  . vitamin C  500 mg Per Tube Daily  . chlorhexidine gluconate (MEDLINE KIT)  15 mL Mouth Rinse BID  . Chlorhexidine Gluconate Cloth  6 each Topical Daily  . clonazePAM  4 mg Per Tube BID  . [START ON 08/13/2019] dexamethasone  (DECADRON) injection  1 mg Intravenous Q24H  . feeding supplement (PRO-STAT SUGAR FREE 64)  30 mL Per Tube BID  . free water  400 mL Per Tube Q8H  . gabapentin  300 mg Per Tube Q8H  . insulin aspart  1 Units Subcutaneous Q4H  . insulin aspart  3-9 Units Subcutaneous Q4H  . magnesium hydroxide  15 mL Oral Daily  . mouth rinse  15 mL Mouth Rinse 10 times per day  . oxyCODONE  15 mg Per Tube Q6H  . oxymetazoline  1 spray  Each Nare BID  . pantoprazole (PROTONIX) IV  40 mg Intravenous QHS  . QUEtiapine  50 mg Per Tube BID  . senna-docusate  1 tablet Per Tube BID  . sodium chloride flush  10-40 mL Intracatheter Q12H  . sodium chloride flush  10-40 mL Intracatheter Q12H  . Thrombi-Pad  1 each Topical Once  . zinc sulfate  220 mg Per Tube Daily    Assessment: 40 yom known COVID+, had been declining with ARDS requiring intubation and max vent settings. Underwent VV ECMO cannulation on 12/26 and continues on IV heparin.  1/4 Switched heparin to bivalirudin due to low  platelets , heparin induced platelet antibody ordered.Heparin stopped and started bivalirudin 0.05 mg/kg/hr. Initial aptt 59 at goal but recheck aptt fell to 48 sec.  Patient with low platelets continues to ooze arounf CT site and from nose - repacked with afrin bullets and DDAVP given. Will slightly increase bivalirudin.    Goal of Therapy:  APTT goal: 50-80 sec Monitor platelets by anticoagulation protocol: Yes   Plan:  -Increase  bivalirudin 0.06 mg/kg/hr -Order aPTT in 4 hours    Bonnita Nasuti Pharm.D. CPP, BCPS Clinical Pharmacist [phone] 08/12/2019 6:46 PM

## 2019-08-12 NOTE — Progress Notes (Signed)
Verbal order from Dr Tamala Julian to discontinue Airborne Precautions.

## 2019-08-12 NOTE — Progress Notes (Signed)
Patient ID: [name], male   DOB: [birth year], 44 y.o.   MRN: [id number]    Advanced Heart Failure Rounding Note   Subjective:    Remains on VV ecmo with good flows. Sats remain in high 80s low 90s. Overnight dropped into 70s transiently. Received cryo x 1 and PLTs x 3 yesterday. Afterload running high but is very volume/preload sensitive. Will chughwith any diuresis. On versed, propofol and dilaudid. Will awaken. LIJ CVL removed. Now with PICC. Bledding has slowed.  Co-ox 69%. Lactate 1.9. Post pump ABG paO2 369  CXR: Bilateral lung opacities c/w ARDS   On vent 50% PEEP 15. Mild improvement. Cannula placement ox.  Bilateral CTs in for PTXs  ECMO parameters Flow 5.3 RPM 3500 dP 35 Pressure -90   Objective:   Weight Range:  Vital Signs:   Temp:  [97.2 F (36.2 C)-97.5 F (36.4 C)] 97.2 F (36.2 C) (01/03 2313) Pulse Rate:  [59-79] 79 (01/04 0700) Resp:  [11] 11 (01/03 1825) BP: (150-197)/(52-67) 197/67 (01/04 0148) SpO2:  [85 %-99 %] 88 % (01/04 0700) Arterial Line BP: (141-199)/(51-66) 187/53 (01/04 0700) FiO2 (%):  [100 %] 100 % (01/04 0400) Weight:  [129.8 kg] 129.8 kg (01/04 0500) Last BM Date: 08/10/19  Weight change: Filed Weights   08/08/19 0300 08/09/19 0500 08/12/19 0500  Weight: 125.4 kg 129.7 kg 129.8 kg    Intake/Output:   Intake/Output Summary (Last 24 hours) at 08/12/2019 0741 Last data filed at 08/12/2019 0700 Gross per 24 hour  Intake 5983 ml  Output 6137 ml  Net -154 ml     Physical Exam: General:  Sedated on vent HEENT: normal nasal packing Neck: supple. Hard to see JVP Carotids 2+ bilat; no bruits. No lymphadenopathy or thryomegaly appreciated. Cor: PMI nondisplaced. Regular rate & rhythm. No rubs, gallops or murmurs. Lungs: minimal movement Abdomen: obese soft, nontender, nondistended. No hepatosplenomegaly. No bruits or masses. Good bowel sounds. Extremities: no cyanosis, clubbing, rash, 1+ edema  Cannulas ok Neuro: intubated and  sedated   Telemetry: Sinus 70-80s Personally reviewed  Labs: Basic Metabolic Panel: Recent Labs  Lab 08/10/19 0412 08/10/19 1700 08/11/19 0509 08/11/19 1645 08/11/19 2030 08/12/19 0222 08/12/19 0420 08/12/19 0441 08/12/19 0550  NA 140 137 133* 138 139 141 141 142 141  K 4.7 5.1 4.5 5.1 4.8 4.4 4.3 4.5 4.3  CL 108 104 103 106  --   --  106  --   --   CO2 _0 --   --  27  --   --   GLUCOSE 161* 179* 149* 207*  --   --  145*  --   --   BUN 35* 32* 27* 25*  --   --  23*  --   --   CREATININE 0.49* 0.43* 0.42* 0.47*  --   --  0.49*  --   --   CALCIUM 8.3* 8.3* 7.9* 8.0*  --   --  8.6*  --   --   MG 2.1  --   --   --   --   --   --   --   --   PHOS 3.4  --   --   --   --   --   --   --   --     Liver Function Tests: Recent Labs  Lab 08/08/19 0314 08/09/19 0453 08/10/19 0412 08/11/19 0509 08/12/19 0420  AST 62* 32 _1 ALT 95*  92* 67* 44 38  ALKPHOS 45 43 36* 37* 37*  BILITOT 0.9 1.0 0.8 0.7 0.8  PROT 4.6* 4.5* 4.7* 4.3* 5.1*  ALBUMIN 3.2* 3.2* 3.3* 2.9* 3.7   No results for input(s): LIPASE, AMYLASE in the last 168 hours. No results for input(s): AMMONIA in the last 168 hours.  CBC: Recent Labs  Lab 08/07/19 2157 08/08/19 0302 08/11/19 0509 08/11/19 1337 08/11/19 1645 08/11/19 1649 08/11/19 2214 08/12/19 0222 08/12/19 0420 08/12/19 0441 08/12/19 0550  WBC 7.2   < > 9.1 9.2 8.8  --  9.8  --  10.1  --   --   NEUTROABS 4.7  --   --   --   --   --   --   --   --   --   --   HGB 8.1*  --  7.7* 8.3* 8.2*  --  10.0* 9.2* 9.9* 9.5* 9.5*  HCT 25.8*  --  22.9* 24.7* 25.1*  --  29.8* 27.0* 29.3* 28.0* 28.0*  MCV 90.5   < > 91.2 89.8 90.6  --  89.8  --  90.4  --   --   PLT 55*   < > 44* 51* 73* 74* 67*  --  71*  --   --    < > = values in this interval not displayed.    Cardiac Enzymes: No results for input(s): CKTOTAL, CKMB, CKMBINDEX, TROPONINI in the last 168 hours.  BNP: BNP (last 3 results) Recent Labs    07/22/19 1039  BNP 15.3     ProBNP (last 3 results) No results for input(s): PROBNP in the last 8760 hours.    Other results:  Imaging: DG CHEST PORT 1 VIEW  Result Date: 08/10/2019 CLINICAL DATA:  Central line placement EXAM: PORTABLE CHEST 1 VIEW COMPARISON:  08/10/2019, 5:21 a.m. FINDINGS: No change in appearance or position of a left neck vascular catheter, tip positioned near the confluence of the superior vena cava. Redemonstrated inferior approach ECMO cannula, tip projecting in the expected vicinity of the right atrium. Support apparatus including tracheostomy and bilateral chest tubes is otherwise unchanged. There may be slight interval improvement in aeration of the bilateral lungs, with generally very dense, extensive bilateral consolidation. The cardiac borders are obscured. IMPRESSION: 1. No change in appearance or position of a left neck vascular catheter, tip positioned near the confluence of the superior vena cava. 2. Redemonstrated inferior approach ECMO cannula, tip projecting in the expected vicinity of the right atrium. 3. There may be slight interval improvement in aeration of the bilateral lungs, with generally very dense, extensive bilateral consolidation. Electronically Signed   By: Eddie Candle M.D.   On: 08/10/2019 19:42   [place] EKG SITE RITE  Result Date: 08/11/2019 If Site Rite image not attached, placement could not be confirmed due to current cardiac rhythm.  [place] EKG SITE RITE  Result Date: 08/11/2019 If Site Rite image not attached, placement could not be confirmed due to current cardiac rhythm.    Medications:     Scheduled Medications: . artificial tears  1 application Both Eyes Q1J  . vitamin C  500 mg Per Tube Daily  . chlorhexidine gluconate (MEDLINE KIT)  15 mL Mouth Rinse BID  . Chlorhexidine Gluconate Cloth  6 each Topical Daily  . clonazePAM  2 mg Per Tube BID  . dexamethasone (DECADRON) injection  3 mg Intravenous Q24H   Followed by  . [START ON 08/13/2019] dexamethasone  (DECADRON) injection  1 mg Intravenous  Q24H  . feeding supplement (PRO-STAT SUGAR FREE 64)  30 mL Per Tube BID  . free water  400 mL Per Tube Q8H  . gabapentin  300 mg Per Tube Q8H  . insulin aspart  1 Units Subcutaneous Q4H  . insulin aspart  3-9 Units Subcutaneous Q4H  . magnesium hydroxide  15 mL Oral Daily  . mouth rinse  15 mL Mouth Rinse 10 times per day  . oxyCODONE  10 mg Per Tube Q6H  . pantoprazole (PROTONIX) IV  40 mg Intravenous QHS  . QUEtiapine  25 mg Per Tube BID  . senna-docusate  1 tablet Per Tube BID  . sodium chloride flush  10-40 mL Intracatheter Q12H  . sodium chloride flush  10-40 mL Intracatheter Q12H  . Thrombi-Pad  1 each Topical Once  . zinc sulfate  220 mg Per Tube Daily    Infusions: . sodium chloride    . sodium chloride Stopped (08/08/19 1922)  . albumin human 25 g (08/12/19 0202)  . bivalirudin (ANGIOMAX) infusion 0.5 mg/mL (Non-ACS indications)    . cisatracurium (NIMBEX) infusion    . dextrose    . feeding supplement (PIVOT 1.5 CAL) 1,000 mL (08/10/19 1452)  . HYDROmorphone 4 mg/hr (08/12/19 0700)  . midazolam (VERSED) infusion 14 mg/hr (08/12/19 0700)  . propofol (DIPRIVAN) infusion 80 mcg/kg/min (08/12/19 0700)    PRN Medications: Place/Maintain arterial line **AND** sodium chloride, sodium chloride, acetaminophen, albumin human, dextrose, hydrALAZINE, HYDROmorphone, labetalol, metoprolol tartrate, midazolam, ondansetron (ZOFRAN) IV, sodium chloride, sodium chloride flush, sodium chloride flush   Assessment/Plan:    1. Acute hypoxic/hypercarbic respiratory failure due to COVID PNA: Remained markedly hypoxic despite full support. Intubated 12/26. S/p bilateral CTs 12/26 for pneumothoraces.  TEE on 12/26 LVEF 70% RV ok. VV ECMO begun 12/26. Day #8 VV ECMO. Tracheostomy 12/29.  COVID ARDS at this point.  Flows stable currently but drops periodically with attempted diuresis and blood loss. - ECMO parameters stable - Sats running high 80s-low  90s. Lungs not participating much in gas exchange at this point. No recirc with normal svo2. Post pump ABG paO2 369. - PLTs 71k this am. LDH ok - CXR slightly improved - Consider addition additional venous drainage cannula in RIJ. Wil d/w Dr. Orvan Seen - D/w DR. Smith at bedside   2. Bilateral PTX: Due to barotrauma.   3. COVID PNA: management of COVID infection per CCM. CXR with bilateral multifocal PNA progressing to likely ARDS.  - s/p 10 days of remdesivir - 12/16, 12/2 s/p tociluzimab - 12/17 s/p convalescent plasma - On decadron - Off abx.    4. ID: Empiric coverage with vancomycin/cefepime for possible secondary bacterial PNA, course completed.   - pull nasal packing today, if rebleeds will ask ENT for support  5. Thrombocytopenia: Slow decrease in platelets, likely low level hemolysis + critical illness.  - 71k ths am after 3u overnight - switch to Bival. Check HIT and TEG  6. Anemia: Bleeding at trach site resolved but still bleeding at right chest tube site and CVL. - Transfuse hgb < 8   7. FEN - continue TFs  8. HTN - unable to tolerate afterload reduction or diuresis due to chugging  Length of Stay: 21   CRITICAL CARE Performed by: Glori Bickers  Total critical care time: 40 minutes  Critical care time was exclusive of separately billable procedures and treating other patients.  Critical care was necessary to treat or prevent imminent or life-threatening deterioration.  Critical care was time  spent personally by me on the following activities: development of treatment plan with patient and/or surrogate as well as nursing, discussions with consultants, evaluation of patient's response to treatment, examination of patient, obtaining history from patient or surrogate, ordering and performing treatments and interventions, ordering and review of laboratory studies, ordering and review of radiographic studies, pulse oximetry and re-evaluation of patient's  condition.    Glori Bickers MD 08/12/2019, 7:41 AM  Advanced Heart Failure Team Pager [phone] (M-F; 7a - 4p)  Please contact [facility] for night-coverage after hours (4p -7a ) and weekends on [email or web address]

## 2019-08-12 NOTE — Progress Notes (Signed)
Updates  Coughing/chugging all morning, required re-paralysis despite multiple increases to sedating meds. Repeat bronch unrevealing. BAL growing staph epi and serratia, vanc/cefepime started Nasal packing removed- still oozing so re-packed with afrin soaked rhinorockets Oozing around R chest tube administered topical lidocaine/epi He is clear to come off isolation, well over 21 days from diagnosis  Additional 60 minutes critical care time not including separately billable procedures

## 2019-08-12 NOTE — Progress Notes (Addendum)
ANTICOAGULATION CONSULT NOTE  Pharmacy Consult for Heparin>bivalirudin Indication: ECMO  No Known Allergies  Patient Measurements: Height: 5' 8"  (172.7 cm) Weight: 286 lb 2.5 oz (129.8 kg) IBW/kg (Calculated) : 68.4 Heparin Dosing Weight: 100.7 kg  Vital Signs: Temp: 97.2 F (36.2 C) (01/03 2313) Temp Source: Oral (01/03 2313) BP: 197/67 (01/04 0148) Pulse Rate: 79 (01/04 0700)  Labs: Recent Labs    08/11/19 0509 08/11/19 0509 08/11/19 0550 08/11/19 1645 08/11/19 1649 08/11/19 2033 08/11/19 2113 08/11/19 2214 08/12/19 0420 08/12/19 0441 08/12/19 0550  HGB 7.7*   < >  --  8.2*  --   --   --  10.0* 9.9* 9.5* 9.5*  HCT 22.9*   < >  --  25.1*  --   --   --  29.8* 29.3* 28.0* 28.0*  PLT 44*   < >   < > 73* 74*  --   --  67* 71*  --   --   APTT 87*  --   --   --  42* 53*  --   --  53*  --   --   LABPROT 16.0*  --   --   --  15.7*  --  14.8  --  14.9  --   --   INR 1.3*  --   --   --  1.3*  --  1.2  --  1.2  --   --   HEPARINUNFRC 0.30  --   --  <0.10*  --  0.16*  --   --  0.17*  --   --   CREATININE 0.42*  --   --  0.47*  --   --   --   --  0.49*  --   --    < > = values in this interval not displayed.    Estimated Creatinine Clearance: 153.4 mL/min (A) (by C-G formula based on SCr of 0.49 mg/dL (L)).   Medical History: Past Medical History:  Diagnosis Date  . Asthma   . GERD (gastroesophageal reflux disease)   . Headache   . History of kidney stones    LEFT URETERAL STONE  . HTN (hypertension)   . Pancreatitis 2018   GALLBALDDER SLUDGE CAUSED ISSUED RESOLVED    Medications:  Scheduled:  . artificial tears  1 application Both Eyes V6H  . vitamin C  500 mg Per Tube Daily  . chlorhexidine gluconate (MEDLINE KIT)  15 mL Mouth Rinse BID  . Chlorhexidine Gluconate Cloth  6 each Topical Daily  . clonazePAM  2 mg Per Tube BID  . dexamethasone (DECADRON) injection  3 mg Intravenous Q24H   Followed by  . [START ON 08/13/2019] dexamethasone (DECADRON) injection  1  mg Intravenous Q24H  . feeding supplement (PRO-STAT SUGAR FREE 64)  30 mL Per Tube BID  . free water  400 mL Per Tube Q8H  . gabapentin  300 mg Per Tube Q8H  . insulin aspart  1 Units Subcutaneous Q4H  . insulin aspart  3-9 Units Subcutaneous Q4H  . magnesium hydroxide  15 mL Oral Daily  . mouth rinse  15 mL Mouth Rinse 10 times per day  . oxyCODONE  10 mg Per Tube Q6H  . pantoprazole (PROTONIX) IV  40 mg Intravenous QHS  . QUEtiapine  25 mg Per Tube BID  . senna-docusate  1 tablet Per Tube BID  . sodium chloride flush  10-40 mL Intracatheter Q12H  . sodium chloride flush  10-40 mL Intracatheter  Q12H  . Thrombi-Pad  1 each Topical Once  . zinc sulfate  220 mg Per Tube Daily    Assessment: 30 yom known COVID+, has been declining with ARDS for past 48 hours requiring intubation and max vent settings. Underwent VV ECMO cannulation on 12/26 and now continues on IV heparin.  Switched to bivalirudin given platelets on 1/4, heparin induced platelet antibody ordered. Will stop heparin and start rate at bivalirudin 0.05 mg/kg/hr. Plan to order TEG with platelet mapping on 1/4.    Goal of Therapy:  APTT goal: 50-80 sec Monitor platelets by anticoagulation protocol: Yes   Plan:  -Discontinue heparin infusion -Start bivalirudin 0.05 mg/kg/hr -Order aPTT in 4 hours  Antonietta Jewel, PharmD, BCCCP Clinical Pharmacist  Phone: [phone]  Please check AMION for all [place] phone numbers After 10:00 PM, call [place] [phone]  ADDENDUM APTT came back therapeutic at 59, on bivalirudin 0.05 mg/kg/hr. No s/sx of bleeding. No infusion issues. Will order next aPTT in 4 hours.  Antonietta Jewel, PharmD, [place] Clinical Pharmacist

## 2019-08-12 NOTE — Progress Notes (Addendum)
Nutrition Follow up  DOCUMENTATION CODES:   Obesity unspecified  INTERVENTION:   Continue tube feeding:  -Pivot 1.5 @ 65 ml/hr via Cortrak (1560 ml)  -30 ml Prostat BID -Free water flushes 400 ml Q8 hours-per CCM  At goal rate TF provides: 2540 kcals (3688 ml with propofol), 176 grams protein, 1186 ml free water (2386 ml with flushes). Meets 100% of needs.   NUTRITION DIAGNOSIS:   Increased nutrient needs related to acute illness(COVID PNA) as evidenced by estimated needs.  Ongoing  GOAL:   Patient will meet greater than or equal to 90% of their needs   Addressed via TF  MONITOR:   Diet advancement, Skin, TF tolerance, Weight trends, Labs, I & O's  REASON FOR ASSESSMENT:   Consult Enteral/tube feeding initiation and management  ASSESSMENT:   Patient with PMH significant for GERD, HTN, and asthma. Presents this admission with COVID-19 PNA.   12/14- admit 12/23- chest xray revealed pneumomediastinum  12/24- developed bilateral pneumothoraces  12/26- worsening hypoxemia, intubated  12/27- VV fem/fem ECMO cannulation  12/29- trach   Pt discussed during ICU rounds and with RN.   Underwent bronchoscopy this am- moderate amount of thick secretion found. Requiring high dose propofol due to coughing fits. Back on paralytic. Nasal bleeding improved- packing to come out today. Having BMs. Tolerating Pivot 1.5 at goal. Needs adjusted (day 22 since initial COVID diagnosis).   Admission weight: 136.1 kg  Current weight: 129.8 kg (trend up from 122 kg on 12/30)  Patient remains on ventilator support via trach  MV: 1.7 L/min Temp (24hrs), Avg:97.5 F (36.4 C), Min:97.2 F (36.2 C), Max:98.8 F (37.1 C)  Propofol: 43.5 ml/hr- provides 1148 kcal from lipids daily   I/O: +15,497 ml since 12/21  UOP: 5,725 ml x 24 hrs  Chest tubes: 512 ml x 24 hrs    Drips: albumin, nimbex, versed, propofol  Medications: 500 mg Vit C, decadron, SS novolog, senokot, 220 mg zinc  sulfate Labs: CBG [id number]   Diet Order:   Diet Order            Diet NPO time specified  Diet effective midnight              EDUCATION NEEDS:   Not appropriate for education at this time  Skin:  Skin Assessment: Reviewed RN Assessment  Last BM:  1/3  Height:   Ht Readings from Last 1 Encounters:  08/09/19 5' 8"  (1.727 m)    Weight:   Wt Readings from Last 1 Encounters:  08/12/19 129.8 kg    Ideal Body Weight:  70 kg  BMI:  Body mass index is 43.51 kg/m.  Estimated Nutritional Needs:   Kcal:  9507-2257 kcal (25-36 kcal/kg)  Protein:  140-175 grams  Fluid:  >/= 2.5 L/day   [name] RD, LDN Clinical Nutrition Pager # - [phone]

## 2019-08-12 NOTE — Progress Notes (Signed)
VAST consulted to place IV access. Upon arrival at bedside, spoke with pt's nurse who stated pt currently has DL PICC, and one PIV. Pt is on ECMO and has been receiving a lot of blood products, continuous drips, and intermittent medications. After speaking with pt's nurse, determined midline placement would be most appropriate line at this time if able to obtain access.

## 2019-08-12 NOTE — Progress Notes (Signed)
Chaplain reached out to Mrs. Gillooly by phone offering support.  Chaplain will continue to follow-up as needed.

## 2019-08-12 NOTE — Progress Notes (Signed)
ANTICOAGULATION CONSULT NOTE  Pharmacy Consult for Heparin Indication: ECMO  No Known Allergies  Patient Measurements: Height: 5' 8"  (172.7 cm) Weight: 285 lb 15 oz (129.7 kg) IBW/kg (Calculated) : 68.4 Heparin Dosing Weight: 100.7 kg  Vital Signs: Temp: 97.2 F (36.2 C) (01/03 2313) Temp Source: Oral (01/03 2313) BP: 197/67 (01/04 0148) Pulse Rate: 73 (01/04 0400)  Labs: Recent Labs    08/11/19 0509 08/11/19 0509 08/11/19 0550 08/11/19 1645 08/11/19 1649 08/11/19 2033 08/11/19 2113 08/11/19 2214 08/12/19 0222 08/12/19 0420 08/12/19 0441  HGB 7.7*   < >  --  8.2*  --   --   --  10.0* 9.2* 9.9* 9.5*  HCT 22.9*   < >  --  25.1*  --   --   --  29.8* 27.0* 29.3* 28.0*  PLT 44*   < >   < > 73* 74*  --   --  67*  --  71*  --   APTT 87*  --   --   --  42* 53*  --   --   --  53*  --   LABPROT 16.0*  --   --   --  15.7*  --  14.8  --   --  14.9  --   INR 1.3*  --   --   --  1.3*  --  1.2  --   --  1.2  --   HEPARINUNFRC 0.30  --   --  <0.10*  --  0.16*  --   --   --  0.17*  --   CREATININE 0.42*  --   --  0.47*  --   --   --   --   --  0.49*  --    < > = values in this interval not displayed.    Estimated Creatinine Clearance: 153.2 mL/min (A) (by C-G formula based on SCr of 0.49 mg/dL (L)).   Medical History: Past Medical History:  Diagnosis Date  . Asthma   . GERD (gastroesophageal reflux disease)   . Headache   . History of kidney stones    LEFT URETERAL STONE  . HTN (hypertension)   . Pancreatitis 2018   GALLBALDDER SLUDGE CAUSED ISSUED RESOLVED    Medications:  Scheduled:  . artificial tears  1 application Both Eyes C1Y  . vitamin C  500 mg Per Tube Daily  . chlorhexidine gluconate (MEDLINE KIT)  15 mL Mouth Rinse BID  . Chlorhexidine Gluconate Cloth  6 each Topical Daily  . clonazePAM  2 mg Per Tube BID  . dexamethasone (DECADRON) injection  3 mg Intravenous Q24H   Followed by  . [START ON 08/13/2019] dexamethasone (DECADRON) injection  1 mg Intravenous  Q24H  . feeding supplement (PRO-STAT SUGAR FREE 64)  30 mL Per Tube BID  . free water  400 mL Per Tube Q8H  . gabapentin  300 mg Per Tube Q8H  . insulin aspart  1 Units Subcutaneous Q4H  . insulin aspart  3-9 Units Subcutaneous Q4H  . magnesium hydroxide  15 mL Oral Daily  . mouth rinse  15 mL Mouth Rinse 10 times per day  . oxyCODONE  10 mg Per Tube Q6H  . pantoprazole (PROTONIX) IV  40 mg Intravenous QHS  . QUEtiapine  25 mg Per Tube BID  . senna-docusate  1 tablet Per Tube BID  . sodium chloride flush  10-40 mL Intracatheter Q12H  . sodium chloride flush  10-40 mL Intracatheter  Q12H  . Thrombi-Pad  1 each Topical Once  . zinc sulfate  220 mg Per Tube Daily    Assessment: 33 yom known COVID+, has been declining with ARDS for past 48 hours requiring intubation and max vent settings. Underwent VV ECMO cannulation on 12/26 and now continues on IV heparin.  Heparin was held 08/10/19 due to increased bleeding overnight. Orders for FFP, platelets, and PRBC. Per Dr. [name], ok to restart   and target lower heparin goal 0.3-0.4 with increased bleeding. -heparin level= 0.16 (heparin rate 1250 units/hr) -aPTT= 53 (goal 66-75)  Heparin was off for some time today due to line placement (restarted at 2:30pm)  1/4 AM update:  Heparin level sub-therapeutic    Goal of Therapy:  Heparin level 0.3-0.4 units/ml >> targeting lower goal with bleed Monitor platelets by anticoagulation protocol: Yes   Plan:  -Increase heparin to 1450 units/hr -Re-check heparin level at [place], PharmD, [place] Pharmacist Phone: [phone]

## 2019-08-12 NOTE — Progress Notes (Signed)
Dr Tamala Julian notified of Platelets of 52. No new oozing sites. Verbal orders received for 20 mcg of DDAVP.

## 2019-08-12 NOTE — Procedures (Signed)
Bronchoscopy  Indication: Abnormal CXR  Consent: Signed in chart  Anesthesia: Already in place  Procedure - Timeout performed - Bronchoscope advanced through ETT - Airways examined down to subsegmental level - Following airway examination, BAL RUL with return of bloody cloudy fluid  Findings - No endobronchial lesions or large mucus plugs - Moderate amount of thick mucoid secretions suctioned - Trach in good position  Specimen(s): RUL BAL  Complications: None immediate

## 2019-08-12 NOTE — Progress Notes (Signed)
NAME:  [name], MRN:  [id number], DOB:  [birth year], LOS: 21 ADMISSION DATE:  07/22/2019, CONSULTATION DATE:  12/24 REFERRING MD:  Sloan Leiter, CHIEF COMPLAINT:  Dyspnea   Brief History   44 y/o M admitted 12/14 with COVID pneumonia causing acute hypoxemic respiratory failure. Developed pneumomediastinum 12/23 with concern for possible bilateral small pneumothoraces on 12/24.  PCCM consulted for evaluation of pneumothoraces  Past Medical History  GERD HTN Asthma  Significant Hospital Events   12/14 admit with hypoxemic respiratory failure in setting of COVID-19 pneumonia 12/23 noted to have pneumomediastinum on chest x-ray 12/24 PCCM consulted for evaluation 12/26 worsening hypoxemia, intubated  12/27 VV fem/fem ECMO cannulation 12/28 ETT switch 12/29 Tracheostomy 12/31 Oozing from multiple sites, H/H drop. Heparin held briefly and lidocaine with epi injected around trach site, Cryo, DDVAP given 1/3 CVL being discontinued   Consults:  PCCM  Procedures:    Significant Diagnostic Tests:  CT chest 12/22 >> extensive pneumomediastinum, multifocal patchy bilateral groundglass opacitiesCT chest 12/22 >> extensive pneumomediastinum, multifocal patchy bilateral groundglass opacities  Micro Data:  BCx2 12/14 >> negative  Sputum 12/15 >> negative   Antimicrobials:  Received remdesivir, steroids, actemra and convalescent plasma  5 days cefepime 12/24-12/29   Interim history/subjective:  Some ongoing oozing but improved Oxygenation varies between 85-100% without any clear pattern Requires large amounts of sedation to prevent coughing fits and chugging.  Objective   Blood pressure (!) 197/67, pulse 79, temperature (!) 97.2 F (36.2 C), temperature source Oral, resp. rate 11, height 5' 8"  (1.727 m), weight 129.8 kg, SpO2 (!) 88 %.    Vent Mode: PCV FiO2 (%):  [50 %-100 %] 50 % Set Rate:  [30 bmp] 30 bmp PEEP:  [10 cmH20-15 cmH20] 15 cmH20 Plateau Pressure:  [26 cmH20-29  cmH20] 29 cmH20   Intake/Output Summary (Last 24 hours) at 08/12/2019 0853 Last data filed at 08/12/2019 0800 Gross per 24 hour  Intake 6024.69 ml  Output 6077 ml  Net -52.31 ml   Filed Weights   08/08/19 0300 08/09/19 0500 08/12/19 0500  Weight: 125.4 kg 129.7 kg 129.8 kg   Examination GEN: obese middle aged man on vent HEENT: Trach in place, minimal oozing, nasal oozing noted despite packing CV: RRR, ext warm PULM: Severely diminished but present bilaterally GI: Protuberant, soft, hypoactive BS EXT: Trace global anasarca NEURO: Will move all 4 ext when sedation lessened PSYCH: RASS -3 to -4 SKIN:  Bilateral femoral cannulas appear CDI Good flows, Pven in low 100s Transoxygenator pressures okay Vent with TV 30-70 cc, patient triggering CBGs okay ABG pAO2 51 Pre/post oxygenator ABGs noted BMP benign Plts 71 HgB 10 WBC okay  Resolved Hospital Problem list     Assessment & Plan:  Big picture: still on vent rest settings, issues ongoing with ECMO induced consumptive coagulopathy and inability to find happy medium with sedation.  Okay but tenuous with current catheter setup (chugs fairly frequently).  Overall prognosis remains guarded but still in single organ failure.  # Acute hypoxemic respiratory failure secondary to Covid - Completed remdesivir, Actemra - On VV ECMO secondary to inability to oxygenate and ventilate 12/26 - On edge with flows which is limiting our ability to lighten sedation (any sedation lightening- chugging), I think another cannula is reasonable but will d/w Dr. Haroldine Laws - f/u bronch cultures - rebronch today with worsening aeration to clear out any mucus plugs  Bilateral pneumothoraces and pneumomediastinum -No airleak in bilateral chest tubes -Continue to waterseal for now -Can do  topical epi if oozing continues to be on issue on R chest  Coagulopathy, thrombocytopenia - trial of switching to bival - send HIT screen although low  suspicion  Nose bleeds - Will try to take packing out, if issues will use murocel and afrin - Discussed with ENT over phone, not much else to offer - Ceftriaxone while packing in place for TSS prevention  Hypoglycemia -SSI coverage  Best practice:   Feeding: TF, FWF Analgesia: see above Sedation: see above Thromboprophylaxis: angiomax Ulcer prophylaxis: pantoprazole Glucose control: SSI Family: will call later  Labs   CBC: Recent Labs  Lab 08/07/19 2157 08/08/19 0302 08/11/19 0509 08/11/19 1337 08/11/19 1645 08/11/19 1649 08/11/19 2214 08/12/19 0222 08/12/19 0420 08/12/19 0441 08/12/19 0550  WBC 7.2   < > 9.1 9.2 8.8  --  9.8  --  10.1  --   --   NEUTROABS 4.7  --   --   --   --   --   --   --   --   --   --   HGB 8.1*  --  7.7* 8.3* 8.2*  --  10.0* 9.2* 9.9* 9.5* 9.5*  HCT 25.8*  --  22.9* 24.7* 25.1*  --  29.8* 27.0* 29.3* 28.0* 28.0*  MCV 90.5   < > 91.2 89.8 90.6  --  89.8  --  90.4  --   --   PLT 55*   < > 44* 51* 73* 74* 67*  --  71*  --   --    < > = values in this interval not displayed.    Basic Metabolic Panel: Recent Labs  Lab 08/10/19 0412 08/10/19 1700 08/11/19 0509 08/11/19 1645 08/11/19 2030 08/12/19 0222 08/12/19 0420 08/12/19 0441 08/12/19 0550  NA 140 137 133* 138 139 141 141 142 141  K 4.7 5.1 4.5 5.1 4.8 4.4 4.3 4.5 4.3  CL 108 104 103 106  --   --  106  --   --   CO2 26 26 25 26   --   --  27  --   --   GLUCOSE 161* 179* 149* 207*  --   --  145*  --   --   BUN 35* 32* 27* 25*  --   --  23*  --   --   CREATININE 0.49* 0.43* 0.42* 0.47*  --   --  0.49*  --   --   CALCIUM 8.3* 8.3* 7.9* 8.0*  --   --  8.6*  --   --   MG 2.1  --   --   --   --   --   --   --   --   PHOS 3.4  --   --   --   --   --   --   --   --    GFR: Estimated Creatinine Clearance: 153.4 mL/min (A) (by C-G formula based on SCr of 0.49 mg/dL (L)). Recent Labs  Lab 08/05/19 1104 08/05/19 1622 08/09/19 0453 08/10/19 0412 08/11/19 0515 08/11/19 1337  08/11/19 1645 08/11/19 2214 08/12/19 0420  PROCALCITON <0.10  --   --   --   --   --   --   --   --   WBC  --   --  10.4 10.3  --  9.2 8.8 9.8 10.1  LATICACIDVEN  --    < > 1.7 1.8 1.8  --   --   --  1.9   < > = values in this interval not displayed.    Liver Function Tests: Recent Labs  Lab 08/08/19 0314 08/09/19 0453 08/10/19 0412 08/11/19 0509 08/12/19 0420  AST 62* 32 22 22 24   ALT 95* 92* 67* 44 38  ALKPHOS 45 43 36* 37* 37*  BILITOT 0.9 1.0 0.8 0.7 0.8  PROT 4.6* 4.5* 4.7* 4.3* 5.1*  ALBUMIN 3.2* 3.2* 3.3* 2.9* 3.7   No results for input(s): LIPASE, AMYLASE in the last 168 hours. No results for input(s): AMMONIA in the last 168 hours.  ABG    Component Value Date/Time   PHART 7.429 08/12/2019 0550   PCO2ART 42.6 08/12/2019 0550   PO2ART 369.0 (H) 08/12/2019 0550   HCO3 28.4 (H) 08/12/2019 0550   TCO2 30 08/12/2019 0550   ACIDBASEDEF 1.0 08/08/2019 2049   O2SAT 100.0 08/12/2019 0550     Coagulation Profile: Recent Labs  Lab 08/10/19 0412 08/11/19 0509 08/11/19 1649 08/11/19 2113 08/12/19 0420  INR 1.3* 1.3* 1.3* 1.2 1.2    Cardiac Enzymes: No results for input(s): CKTOTAL, CKMB, CKMBINDEX, TROPONINI in the last 168 hours.  HbA1C: Hgb A1c MFr Bld  Date/Time Value Ref Range Status  07/24/2019 04:50 AM 6.7 (H) 4.8 - 5.6 % Final    Comment:    (NOTE) Pre diabetes:          5.7%-6.4% Diabetes:              >6.4% Glycemic control for   <7.0% adults with diabetes   09/30/2017 05:36 AM 5.7 (H) 4.8 - 5.6 % Final    Comment:    (NOTE)         Prediabetes: 5.7 - 6.4         Diabetes: >6.4         Glycemic control for adults with diabetes: <7.0     CBG: Recent Labs  Lab 08/11/19 1651 08/11/19 2029 08/11/19 2328 08/12/19 0440 08/12/19 0800  GLUCAP 208* 184* 170* 143* 139*     Critical care time:     The patient is critically ill with multiple organ systems failure and requires high complexity decision making for assessment and support,  frequent evaluation and titration of therapies, application of advanced monitoring technologies and extensive interpretation of multiple databases. Critical Care Time devoted to patient care services described in this note independent of APP/resident time (if applicable)  is 40 minutes.   Erskine Emery MD [facility] 08/12/2019 9:15 AM Personal pager: [phone] If unanswered, please page CCM On-call: [phone]

## 2019-08-12 NOTE — Progress Notes (Signed)
PCCM  Calledto update wife, no answer.  Erskine Emery MD PCCM

## 2019-08-12 NOTE — Progress Notes (Signed)
Daily Progress Note   Patient Name: [name]       Date: 08/12/2019 DOB: [birth year]  Age: 44 y.o. MRN#: [id number] Attending Physician: Wonda Olds, MD Primary Care Physician: Tamsen Roers, MD Admit Date: 07/22/2019  Reason for Consultation/Follow-up: Establishing goals of care  Subjective: Noted bronch today with thick mucoid secretions suctioned. Blood in urine. Nosebleeding. Difficulties with lightening sedation. Per critical care note "overall prognosis remains guarded but still in single organ failure".  Called Jacklyn to followup from our conversation yesterday. Clarified visitation with her- COVID restrictions removed- encouraged [name] that she could come in and sit with Embry, hold his hand. She is reluctant due to worried about contracting another illness. She also wishes to bring her son- reviewed with her that unfortunately only one visitor for now- and only if she wants to- it isn't a requirement.   Review of Systems  Unable to perform ROS: Medical condition    Length of Stay: 21  Current Medications: Scheduled Meds:  . artificial tears  1 application Both Eyes G6K  . vitamin C  500 mg Per Tube Daily  . chlorhexidine gluconate (MEDLINE KIT)  15 mL Mouth Rinse BID  . Chlorhexidine Gluconate Cloth  6 each Topical Daily  . clonazePAM  4 mg Per Tube BID  . [START ON 08/13/2019] dexamethasone (DECADRON) injection  1 mg Intravenous Q24H  . feeding supplement (PRO-STAT SUGAR FREE 64)  30 mL Per Tube BID  . free water  400 mL Per Tube Q8H  . gabapentin  300 mg Per Tube Q8H  . insulin aspart  1 Units Subcutaneous Q4H  . insulin aspart  3-9 Units Subcutaneous Q4H  . magnesium hydroxide  15 mL Oral Daily  . mouth rinse  15 mL Mouth Rinse 10 times per day  . oxyCODONE  15  mg Per Tube Q6H  . oxymetazoline  1 spray Each Nare BID  . pantoprazole (PROTONIX) IV  40 mg Intravenous QHS  . QUEtiapine  50 mg Per Tube BID  . senna-docusate  1 tablet Per Tube BID  . sodium chloride flush  10-40 mL Intracatheter Q12H  . sodium chloride flush  10-40 mL Intracatheter Q12H  . Thrombi-Pad  1 each Topical Once  . zinc sulfate  220 mg Per Tube Daily    Continuous Infusions: .  sodium chloride    . sodium chloride 10 mL/hr at 08/12/19 1400  . albumin human 25 g (08/12/19 1059)  . bivalirudin (ANGIOMAX) infusion 0.5 mg/mL (Non-ACS indications) 0.05 mg/kg/hr (08/12/19 1400)  . cefTRIAXone (ROCEPHIN)  IV Stopped (08/12/19 1141)  . cisatracurium (NIMBEX) infusion 3 mcg/kg/min (08/12/19 1400)  . dextrose    . feeding supplement (PIVOT 1.5 CAL) 1,000 mL (08/10/19 1452)  . HYDROmorphone 6 mg/hr (08/12/19 1400)  . midazolam (VERSED) infusion 25 mg/hr (08/12/19 1400)  . propofol (DIPRIVAN) infusion 80 mcg/kg/min (08/12/19 1400)    PRN Meds: Place/Maintain arterial line **AND** sodium chloride, sodium chloride, acetaminophen, albumin human, dextrose, hydrALAZINE, HYDROmorphone, labetalol, metoprolol tartrate, midazolam, ondansetron (ZOFRAN) IV, sodium chloride, sodium chloride flush, sodium chloride flush, sodium chloride flush  Physical Exam Vitals reviewed.  Constitutional:      Appearance: He is obese. He is ill-appearing.  HENT:     Nose:     Comments: Epistaxis- packing present Cardiovascular:     Rate and Rhythm: Regular rhythm.  Skin:    General: Skin is warm and dry.     Coloration: Skin is pale.  Neurological:     Comments: sedated             Vital Signs: BP (!) 116/51 (BP Location: Right Arm)   Pulse 79   Temp 98.8 F (37.1 C) (Oral)   Resp 15   Ht _0  (1.727 m)   Wt 129.8 kg   SpO2 (!) 87%   BMI 43.51 kg/m  SpO2: SpO2: (!) 87 % O2 Device: O2 Device: Ventilator O2 Flow Rate: O2 Flow Rate (L/min): 45 L/min(45L 15L NRB)  Intake/output summary:    Intake/Output Summary (Last 24 hours) at 08/12/2019 1524 Last data filed at 08/12/2019 1400 Gross per 24 hour  Intake 4570 ml  Output 6197 ml  Net -1627 ml   LBM: Last BM Date: 08/11/19 Baseline Weight: Weight: 136.1 kg Most recent weight: Weight: 129.8 kg       Palliative Assessment/Data:  PPS: 10%     Patient Active Problem List   Diagnosis Date Noted  . Personal history of ECMO   . Advanced care planning/counseling discussion   . Acute respiratory distress syndrome (ARDS) due to COVID-19 virus ([place])   . Palliative care by specialist   . Goals of care, counseling/discussion   . Advanced directives, counseling/discussion   . Subcutaneous crepitus   . Pneumonia due to COVID-19 virus 07/23/2019  . Acute respiratory failure with hypoxia ([place]) 07/22/2019  . Persistent asthma with undetermined severity   . Thrombocytopenia ([place])   . Leukopenia   . Fever   . Gram positive bacterial infection   . Septic arthritis of right acromioclavicular joint ([place]) 09/29/2017  . Chronic left-sided low back pain with left-sided sciatica 09/19/2017  . Epigastric pain   . Pancreatitis 04/17/2017  . Essential hypertension 04/17/2017  . Moderate persistent asthma 07/21/2015  . Allergic rhinoconjunctivitis 07/21/2015  . GERD (gastroesophageal reflux disease) 07/21/2015  . ABNORMALITY OF GAIT 11/12/2009  . TARSAL TUNNEL SYNDROME, LEFT 10/08/2009  . PES PLANUS 10/08/2009    Palliative Care Assessment & Plan   Patient Profile: 44 y/o M admitted 12/14 with COVIDpneumonia causing acutehypoxemic respiratory failure. Developed pneumomediastinum 12/23 with concern for possible bilateral small pneumothoraces on 12/24. PCCM consulted for evaluation of pneumothoraces.During admission patient has had placement of bilateral chest tubes, and is now on VV ECMO, and vent by trach. Recovery has been complicated by ongoing bleeding- frank blood in chest tubes,  bleeding from around trach site, epistaxis-  has received multiple blood transfusions, platelet transfusion. Also received albumin for volume expansion due to low flow after receiving diuresis- diuresis now on hold. Palliative medicine consulted for support and GOC.   Assessment/Recommendations/Plan  Patient continues on ECMO, vent, heavily sedated, bleeding issues-  PMT will continue to support family- will touch base with Jacklyn on Thursday or Friday Please call PMT if assistance is needed before then  Goals of Care and Additional Recommendations: Limitations on Scope of Treatment: Full Scope Treatment  Code Status: Full code  Prognosis:  Unable to determine  Discharge Planning: To Be Determined  Care plan was discussed with patient's spouse- [name].  Thank you for allowing the Palliative Medicine Team to assist in the care of this patient.   Time In: 1500 Time Out: 1535 Total Time 35 mins Prolonged Time Billed no      Greater than 50%  of this time was spent counseling and coordinating care related to the above assessment and plan.  Mariana Kaufman, AGNP-C Palliative Medicine   Please contact Palliative Medicine Team phone at [phone] for questions and concerns.

## 2019-08-13 ENCOUNTER — Encounter (HOSPITAL_COMMUNITY): Payer: Self-pay | Admitting: Certified Registered"

## 2019-08-13 ENCOUNTER — Inpatient Hospital Stay (HOSPITAL_COMMUNITY): Payer: Managed Care, Other (non HMO)

## 2019-08-13 DIAGNOSIS — J1282 Pneumonia due to coronavirus disease 2019: Secondary | ICD-10-CM

## 2019-08-13 LAB — TYPE AND SCREEN
ABO/RH(D): O POS
Antibody Screen: NEGATIVE
Unit division: 0
Unit division: 0
Unit division: 0
Unit division: 0
Unit division: 0
Unit division: 0
Unit division: 0
Unit division: 0

## 2019-08-13 LAB — BPAM RBC
Blood Product Expiration Date: 202101282359
Blood Product Expiration Date: 202101282359
Blood Product Expiration Date: 202101302359
Blood Product Expiration Date: 202101302359
Blood Product Expiration Date: 202101302359
Blood Product Expiration Date: 202101302359
Blood Product Expiration Date: 202102032359
Blood Product Expiration Date: 202102032359
ISSUE DATE / TIME: 202101020544
ISSUE DATE / TIME: 202101022137
ISSUE DATE / TIME: 202101022137
ISSUE DATE / TIME: 202101030111
ISSUE DATE / TIME: 202101030857
ISSUE DATE / TIME: 202101031124
ISSUE DATE / TIME: 202101031614
ISSUE DATE / TIME: 202101031614
Unit Type and Rh: 5100
Unit Type and Rh: 5100
Unit Type and Rh: 5100
Unit Type and Rh: 5100
Unit Type and Rh: 5100
Unit Type and Rh: 5100
Unit Type and Rh: 5100
Unit Type and Rh: 5100

## 2019-08-13 LAB — BASIC METABOLIC PANEL WITH GFR
Anion gap: 9 (ref 5–15)
BUN: 29 mg/dL — ABNORMAL HIGH (ref 6–20)
CO2: 23 mmol/L (ref 22–32)
Calcium: 8.7 mg/dL — ABNORMAL LOW (ref 8.9–10.3)
Chloride: 108 mmol/L (ref 98–111)
Creatinine, Ser: 0.53 mg/dL — ABNORMAL LOW (ref 0.61–1.24)
GFR calc Af Amer: >60 mL/min
GFR calc non Af Amer: >60 mL/min
Glucose, Bld: 171 mg/dL — ABNORMAL HIGH (ref 70–99)
Potassium: 4.3 mmol/L (ref 3.5–5.1)
Sodium: 140 mmol/L (ref 135–145)

## 2019-08-13 LAB — CBC
HCT: 27.7 % — ABNORMAL LOW (ref 39.0–52.0)
HCT: 28 % — ABNORMAL LOW (ref 39.0–52.0)
Hemoglobin: 8.9 g/dL — ABNORMAL LOW (ref 13.0–17.0)
Hemoglobin: 8.8 g/dL — ABNORMAL LOW (ref 13.0–17.0)
MCH: 30.6 pg (ref 26.0–34.0)
MCH: 30.7 pg (ref 26.0–34.0)
MCHC: 32.1 g/dL (ref 30.0–36.0)
MCHC: 31.4 g/dL (ref 30.0–36.0)
MCV: 95.2 fL (ref 80.0–100.0)
MCV: 97.6 fL (ref 80.0–100.0)
Platelets: 48 10*3/uL — ABNORMAL LOW (ref 150–400)
Platelets: 58 10*3/uL — ABNORMAL LOW (ref 150–400)
RBC: 2.91 MIL/uL — ABNORMAL LOW (ref 4.22–5.81)
RBC: 2.87 MIL/uL — ABNORMAL LOW (ref 4.22–5.81)
RDW: 18.6 % — ABNORMAL HIGH (ref 11.5–15.5)
RDW: 18.9 % — ABNORMAL HIGH (ref 11.5–15.5)
WBC: 8.5 10*3/uL (ref 4.0–10.5)
WBC: 8.8 10*3/uL (ref 4.0–10.5)
nRBC: 3.8 % — ABNORMAL HIGH (ref 0.0–0.2)
nRBC: 2.9 % — ABNORMAL HIGH (ref 0.0–0.2)

## 2019-08-13 LAB — HEPATIC FUNCTION PANEL
ALT: 33 U/L (ref 0–44)
AST: 19 U/L (ref 15–41)
Albumin: 3.4 g/dL — ABNORMAL LOW (ref 3.5–5.0)
Alkaline Phosphatase: 43 U/L (ref 38–126)
Bilirubin, Direct: 0.2 mg/dL (ref 0.0–0.2)
Indirect Bilirubin: 0.4 mg/dL (ref 0.3–0.9)
Total Bilirubin: 0.6 mg/dL (ref 0.3–1.2)
Total Protein: 5.1 g/dL — ABNORMAL LOW (ref 6.5–8.1)

## 2019-08-13 LAB — LACTIC ACID, PLASMA: Lactic Acid, Venous: 1.9 mmol/L (ref 0.5–1.9)

## 2019-08-13 LAB — BASIC METABOLIC PANEL
Anion gap: 9 (ref 5–15)
BUN: 30 mg/dL — ABNORMAL HIGH (ref 6–20)
CO2: 24 mmol/L (ref 22–32)
Calcium: 8.6 mg/dL — ABNORMAL LOW (ref 8.9–10.3)
Chloride: 108 mmol/L (ref 98–111)
Creatinine, Ser: 0.5 mg/dL — ABNORMAL LOW (ref 0.61–1.24)
GFR calc Af Amer: >60 mL/min (ref >60)
GFR calc non Af Amer: >60 mL/min (ref >60)
Glucose, Bld: 172 mg/dL — ABNORMAL HIGH (ref 70–99)
Potassium: 4.5 mmol/L (ref 3.5–5.1)
Sodium: 141 mmol/L (ref 135–145)

## 2019-08-13 LAB — POCT I-STAT 7, (LYTES, BLD GAS, ICA,H+H)
Acid-Base Excess: 1 mmol/L (ref 0.0–2.0)
Acid-base deficit: 3 mmol/L — ABNORMAL HIGH (ref 0.0–2.0)
Bicarbonate: 24.4 mmol/L (ref 20.0–28.0)
Bicarbonate: 26.3 mmol/L (ref 20.0–28.0)
Bicarbonate: 24.9 mmol/L (ref 20.0–28.0)
Bicarbonate: 21.8 mmol/L (ref 20.0–28.0)
Calcium, Ion: 1.26 mmol/L (ref 1.15–1.40)
Calcium, Ion: 1.3 mmol/L (ref 1.15–1.40)
Calcium, Ion: 1.29 mmol/L (ref 1.15–1.40)
Calcium, Ion: 1.24 mmol/L (ref 1.15–1.40)
HCT: 25 % — ABNORMAL LOW (ref 39.0–52.0)
HCT: 29 % — ABNORMAL LOW (ref 39.0–52.0)
HCT: 28 % — ABNORMAL LOW (ref 39.0–52.0)
HCT: 23 % — ABNORMAL LOW (ref 39.0–52.0)
Hemoglobin: 8.5 g/dL — ABNORMAL LOW (ref 13.0–17.0)
Hemoglobin: 9.9 g/dL — ABNORMAL LOW (ref 13.0–17.0)
Hemoglobin: 9.5 g/dL — ABNORMAL LOW (ref 13.0–17.0)
Hemoglobin: 7.8 g/dL — ABNORMAL LOW (ref 13.0–17.0)
O2 Saturation: 92 %
O2 Saturation: 88 %
O2 Saturation: 90 %
O2 Saturation: 89 %
Patient temperature: 97.7
Patient temperature: 98.4
Patient temperature: 98.2
Patient temperature: 36.8
Potassium: 4.4 mmol/L (ref 3.5–5.1)
Potassium: 4.5 mmol/L (ref 3.5–5.1)
Potassium: 4.5 mmol/L (ref 3.5–5.1)
Potassium: 3.9 mmol/L (ref 3.5–5.1)
Sodium: 142 mmol/L (ref 135–145)
Sodium: 141 mmol/L (ref 135–145)
Sodium: 141 mmol/L (ref 135–145)
Sodium: 143 mmol/L (ref 135–145)
TCO2: 26 mmol/L (ref 22–32)
TCO2: 28 mmol/L (ref 22–32)
TCO2: 26 mmol/L (ref 22–32)
TCO2: 23 mmol/L (ref 22–32)
pCO2 arterial: 37.4 mmHg (ref 32.0–48.0)
pCO2 arterial: 46.2 mmHg (ref 32.0–48.0)
pCO2 arterial: 42.3 mmHg (ref 32.0–48.0)
pCO2 arterial: 38 mmHg (ref 32.0–48.0)
pH, Arterial: 7.42 (ref 7.350–7.450)
pH, Arterial: 7.363 (ref 7.350–7.450)
pH, Arterial: 7.376 (ref 7.350–7.450)
pH, Arterial: 7.365 (ref 7.350–7.450)
pO2, Arterial: 60 mmHg — ABNORMAL LOW (ref 83.0–108.0)
pO2, Arterial: 57 mmHg — ABNORMAL LOW (ref 83.0–108.0)
pO2, Arterial: 59 mmHg — ABNORMAL LOW (ref 83.0–108.0)
pO2, Arterial: 57 mmHg — ABNORMAL LOW (ref 83.0–108.0)

## 2019-08-13 LAB — ACID FAST SMEAR (AFB, MYCOBACTERIA): Acid Fast Smear: NEGATIVE

## 2019-08-13 LAB — GLUCOSE, CAPILLARY
Glucose-Capillary: 156 mg/dL — ABNORMAL HIGH (ref 70–99)
Glucose-Capillary: 162 mg/dL — ABNORMAL HIGH (ref 70–99)
Glucose-Capillary: 168 mg/dL — ABNORMAL HIGH (ref 70–99)
Glucose-Capillary: 160 mg/dL — ABNORMAL HIGH (ref 70–99)
Glucose-Capillary: 153 mg/dL — ABNORMAL HIGH (ref 70–99)
Glucose-Capillary: 127 mg/dL — ABNORMAL HIGH (ref 70–99)

## 2019-08-13 LAB — APTT
aPTT: 52 seconds — ABNORMAL HIGH (ref 24–36)
aPTT: 52 seconds — ABNORMAL HIGH (ref 24–36)
aPTT: 56 seconds — ABNORMAL HIGH (ref 24–36)

## 2019-08-13 LAB — CULTURE, BAL-QUANTITATIVE W GRAM STAIN
Culture: >=100000 — AB
Special Requests: NORMAL

## 2019-08-13 LAB — TRIGLYCERIDES: Triglycerides: 279 mg/dL — ABNORMAL HIGH (ref <150)

## 2019-08-13 LAB — HEPARIN INDUCED PLATELET AB (HIT ANTIBODY): Heparin Induced Plt Ab: 0.082 OD (ref 0.000–0.400)

## 2019-08-13 LAB — COOXEMETRY PANEL
Carboxyhemoglobin: 3.1 % — ABNORMAL HIGH (ref 0.5–1.5)
Methemoglobin: 2 % — ABNORMAL HIGH (ref 0.0–1.5)
O2 Saturation: 74.2 %
Total hemoglobin: 9.7 g/dL — ABNORMAL LOW (ref 12.0–16.0)

## 2019-08-13 LAB — PROTIME-INR
INR: 1.5 — ABNORMAL HIGH (ref 0.8–1.2)
Prothrombin Time: 17.9 s — ABNORMAL HIGH (ref 11.4–15.2)

## 2019-08-13 LAB — FIBRINOGEN: Fibrinogen: 298 mg/dL (ref 210–475)

## 2019-08-13 LAB — LACTATE DEHYDROGENASE: LDH: 314 U/L — ABNORMAL HIGH (ref 98–192)

## 2019-08-13 MED ORDER — SODIUM CHLORIDE 0.9% IV SOLUTION: Freq: Once | INTRAVENOUS | Status: AC

## 2019-08-13 MED ORDER — FUROSEMIDE 10 MG/ML IJ SOLN
20.0000 mg | Freq: Once | INTRAMUSCULAR | Status: AC
  Administered 2019-08-13: 20 mg via INTRAVENOUS
  Filled 2019-08-13: qty 2

## 2019-08-13 MED ORDER — FREE WATER
300.0000 mL | Freq: Three times a day (TID) | Status: DC
  Administered 2019-08-13 – 2019-08-14 (×3): 300 mL

## 2019-08-13 NOTE — Progress Notes (Signed)
Called by RN for nose bleed and need for packing.  On exam, severe hemorrhage.  Consulted with ENT and recommendations followed using special packs from SPD with afrin.  Bleeding controlled.  Waited for supply to arrive bedside and addressed vent concerns.  The patient is critically ill with multiple organ systems failure and requires high complexity decision making for assessment and support, frequent evaluation and titration of therapies, application of advanced monitoring technologies and extensive interpretation of multiple databases.   Critical Care Time devoted to patient care services described in this note is  60  Minutes. This time reflects time of care of this signee Dr Jennet Maduro. This critical care time does not reflect procedure time, or teaching time or supervisory time of PA/NP/Med student/Med Resident etc but could involve care discussion time.  Rush Farmer, M.D. [facility].

## 2019-08-13 NOTE — Progress Notes (Signed)
ANTICOAGULATION CONSULT NOTE  Pharmacy Consult for bivalirudin Indication: ECMO  No Known Allergies  Patient Measurements: Height: 5' 8"  (172.7 cm) Weight: 286 lb 2.5 oz (129.8 kg) IBW/kg (Calculated) : 68.4 Heparin Dosing Weight: 100.7 kg  Vital Signs: Temp: 98.8 F (37.1 C) (01/04 1600) Temp Source: Oral (01/04 1600) BP: 120/52 (01/04 2322) Pulse Rate: 68 (01/05 0200)  Labs: Recent Labs    08/11/19 0509 08/11/19 1645 08/11/19 1649 08/11/19 1649 08/11/19 2033 08/11/19 2113 08/11/19 2214 08/12/19 0420 08/12/19 0933 08/12/19 1327 08/12/19 1616 08/12/19 1640 08/12/19 2000 08/12/19 2332  HGB   < > 8.2*  --    < >  --   --  10.0* 9.9*  --   --  9.6* 9.9* 9.2*  --   HCT   < > 25.1*  --    < >  --   --  29.8* 29.3*  --   --  28.9* 29.0* 27.0*  --   PLT   < > 73* 74*  --   --   --  67* 71*  --   --  52*  --   --   --   APTT  --   --  42*   < > 53*  --   --  53* 59*  --  48*  --   --  52*  LABPROT  --   --  15.7*  --   --  14.8  --  14.9  --   --   --   --   --   --   INR  --   --  1.3*  --   --  1.2  --  1.2  --   --   --   --   --   --   HEPARINUNFRC   < > <0.10*  --   --  0.16*  --   --  0.17*  --  <0.10*  --   --   --   --   CREATININE  --  0.47*  --   --   --   --   --  0.49*  --   --  0.61  --   --   --    < > = values in this interval not displayed.    Estimated Creatinine Clearance: 153.4 mL/min (by C-G formula based on SCr of 0.61 mg/dL).  Assessment: 44 yo male known COVID+ and ARDS VV ECMO switched to bivalirudin due to thrombocytopenia.  APTT within goal range.  Goal of Therapy:  APTT goal: 50-80 sec Monitor platelets by anticoagulation protocol: Yes   Plan:  Continue bivalirudin at current rate for now Follow-up am labs.   Phillis Knack, PharmD, BCPS  08/13/2019 3:03 AM

## 2019-08-13 NOTE — Progress Notes (Signed)
Assisted tele visit to patient with wife, [name].  Maryelizabeth Rowan, RN

## 2019-08-13 NOTE — Progress Notes (Signed)
NAME:  [name], MRN:  [id number], DOB:  1975/10/13, LOS: 15 ADMISSION DATE:  07/22/2019, CONSULTATION DATE:  12/24 REFERRING MD:  Sloan Leiter, CHIEF COMPLAINT:  Dyspnea   Brief History   44 y/o M admitted 12/14 with COVID pneumonia causing acute hypoxemic respiratory failure. Developed pneumomediastinum 12/23 with concern for possible bilateral small pneumothoraces on 12/24.  PCCM consulted for evaluation of pneumothoraces  Past Medical History  GERD HTN Asthma  Significant Hospital Events   12/14 admit with hypoxemic respiratory failure in setting of COVID-19 pneumonia 12/23 noted to have pneumomediastinum on chest x-ray 12/24 PCCM consulted for evaluation 12/26 worsening hypoxemia, intubated  12/27 VV fem/fem ECMO cannulation 12/28 ETT switch 12/29 Tracheostomy 12/31 Oozing from multiple sites, H/H drop. Heparin held briefly and lidocaine with epi injected around trach site, Cryo, DDVAP given 1/3 CVL being discontinued   Consults:  PCCM  Procedures:  1/4 bronch: bal performed  Significant Diagnostic Tests:  CT chest 12/22 >> extensive pneumomediastinum, multifocal patchy bilateral groundglass opacities   Micro Data:  BCx2 12/14 >> negative  Sputum 12/15 >> negative  1/4 bronch:  1/2 acid fast smear bronch: negative 1/2 fungal cx bronch: pending  Antimicrobials:  Received remdesivir, steroids, actemra and convalescent plasma  5 days cefepime 12/24-12/29   Interim history/subjective:  1/5: no further oozing appreciated over course of evening to today. Cont on large amounts of sedation and re-paralyzed overnight. Remains on fixed vent settings with stable flow and sweep o f5.4 and 9. cxr stable to previously, personally reviewed by me 1/4: Some ongoing oozing but improved Oxygenation varies between 85-100% without any clear pattern Requires large amounts of sedation to prevent coughing fits and chugging.  Objective   Blood pressure (!) 138/57, pulse 64,  temperature 98.2 F (36.8 C), temperature source Oral, resp. rate (!) 63, height _0  (1.727 m), weight 134.2 kg, SpO2 94 %.    Vent Mode: PCV FiO2 (%):  [50 %] 50 % Set Rate:  [15 bmp-16 bmp] 15 bmp PEEP:  [15 cmH20] 15 cmH20 Plateau Pressure:  [27 cmH20-29 cmH20] 28 cmH20   Intake/Output Summary (Last 24 hours) at 08/13/2019 1319 Last data filed at 08/13/2019 1300 Gross per 24 hour  Intake 7918.43 ml  Output 4885 ml  Net 3033.43 ml   Filed Weights   08/09/19 0500 08/12/19 0500 08/13/19 0439  Weight: 129.7 kg 129.8 kg 134.2 kg   Examination GEN: obese middle aged man on vent HEENT: Trach in place, minimal oozing, nasal oozing noted despite packing CV: RRR, ext warm PULM: Severely diminished but present bilaterally GI: Protuberant, soft, hypoactive BS EXT: Trace global anasarca NEURO: Will move all 4 ext when sedation lessened SKIN:  Bilateral femoral cannulas appear CDI  Resolved Hospital Problem list     Assessment & Plan:  Big picture: still on vent rest settings, issues ongoing with ECMO induced consumptive coagulopathy and inability to find happy medium with sedation.  Okay but tenuous with current catheter setup (chatters fairly frequently).  Overall prognosis remains guarded but still in single organ failure.  # Acute hypoxemic respiratory failure secondary to Covid - Completed remdesivir, Actemra - On VV ECMO secondary to inability to oxygenate and ventilate 12/26 - On edge with flows which is limiting our ability to lighten sedation (any sedation lightening- chattering), holding off on other outflow cannula at this time. However do not appreciate any ability to change vent settings at this time as well as the lightening of sedation - f/u bronch cultures -minimal  air bronchograms now appreciable on cxr but mostly stable from yesterday, personally reviewed.  Staph epi and serratia marcescens pneumonia:  -cefepime and vanc  Bilateral pneumothoraces and  pneumomediastinum -No airleak in bilateral chest tubes -Continue to waterseal for now -Can do topical epi if oozing continues to be on issue on R chest  Coagulopathy, thrombocytopenia - trial of switching to bivalirudin - heparin ab pending -transfused plts 1/5 am.   Nose bleeds - repacked with rhinorockets overnight - Discussed with ENT over phone, not much else to offer - cefepime/vanc (on for above pna) while packing in place for TSS prevention (ceftriaxone otherwise)  Hypoglycemia -SSI coverage  Best practice:   Feeding: TF, FWF Analgesia: see above Sedation: see above Thromboprophylaxis: bivalirudin Ulcer prophylaxis: pantoprazole Glucose control: SSI Family: updated per primary  Labs   CBC: Recent Labs  Lab 08/07/19 2157 08/08/19 0302 08/11/19 1645 08/11/19 1645 08/11/19 1649 08/11/19 2214 08/12/19 0420 08/12/19 1616 08/12/19 1640 08/12/19 2000 08/13/19 0427 08/13/19 0429  WBC 7.2   < > 8.8  --   --  9.8 10.1 9.6  --   --  8.5  --   NEUTROABS 4.7  --   --   --   --   --   --   --   --   --   --   --   HGB 8.1*  --  8.2*   < >  --  10.0* 9.9* 9.6* 9.9* 9.2* 8.9* 8.5*  HCT 25.8*  --  25.1*   < >  --  29.8* 29.3* 28.9* 29.0* 27.0* 27.7* 25.0*  MCV 90.5   < > 90.6  --   --  89.8 90.4 92.6  --   --  95.2  --   PLT 55*   < > 73*  --  74* 67* 71* 52*  --   --  48*  --    < > = values in this interval not displayed.    Basic Metabolic Panel: Recent Labs  Lab 08/10/19 0412 08/10/19 0615 08/11/19 0509 08/11/19 1645 08/12/19 0420 08/12/19 1616 08/12/19 1640 08/12/19 2000 08/13/19 0427 08/13/19 0429  NA 140  --  133* 138 141 142 142 143 140 142  K 4.7  --  4.5 5.1 4.3 4.9 4.9 4.9 4.3 4.4  CL 108   < > 103 106 106 109  --   --  108  --   CO2 26   < > _0 --   --  23  --   GLUCOSE 161*   < > 149* 207* 145* 233*  --   --  171*  --   BUN 35*   < > 27* 25* 23* 24*  --   --  29*  --   CREATININE 0.49*   < > 0.42* 0.47* 0.49* 0.61  --   --  0.53*   --   CALCIUM 8.3*   < > 7.9* 8.0* 8.6* 8.8*  --   --  8.7*  --   MG 2.1  --   --   --   --   --   --   --   --   --   PHOS 3.4  --   --   --   --   --   --   --   --   --    < > = values in this interval not displayed.   GFR: Estimated Creatinine Clearance:  156.2 mL/min (A) (by C-G formula based on SCr of 0.53 mg/dL (L)). Recent Labs  Lab 08/10/19 0412 08/11/19 0515 08/11/19 2214 08/12/19 0420 08/12/19 1616 08/13/19 0427  WBC 10.3  --  9.8 10.1 9.6 8.5  LATICACIDVEN 1.8 1.8  --  1.9  --  1.9    Liver Function Tests: Recent Labs  Lab 08/09/19 0453 08/10/19 0412 08/11/19 0509 08/12/19 0420 08/13/19 0427  AST 32 _0 ALT 92* 67* 44 38 33  ALKPHOS 43 36* 37* 37* 43  BILITOT 1.0 0.8 0.7 0.8 0.6  PROT 4.5* 4.7* 4.3* 5.1* 5.1*  ALBUMIN 3.2* 3.3* 2.9* 3.7 3.4*   No results for input(s): LIPASE, AMYLASE in the last 168 hours. No results for input(s): AMMONIA in the last 168 hours.  ABG    Component Value Date/Time   PHART 7.420 08/13/2019 0429   PCO2ART 37.4 08/13/2019 0429   PO2ART 60.0 (L) 08/13/2019 0429   HCO3 24.4 08/13/2019 0429   TCO2 26 08/13/2019 0429   ACIDBASEDEF 1.0 08/08/2019 2049   O2SAT 74.2 08/13/2019 0440     Coagulation Profile: Recent Labs  Lab 08/11/19 0509 08/11/19 1649 08/11/19 2113 08/12/19 0420 08/13/19 0427  INR 1.3* 1.3* 1.2 1.2 1.5*    Cardiac Enzymes: No results for input(s): CKTOTAL, CKMB, CKMBINDEX, TROPONINI in the last 168 hours.  HbA1C: Hgb A1c MFr Bld  Date/Time Value Ref Range Status  07/24/2019 04:50 AM 6.7 (H) 4.8 - 5.6 % Final    Comment:    (NOTE) Pre diabetes:          5.7%-6.4% Diabetes:              >6.4% Glycemic control for   <7.0% adults with diabetes   09/30/2017 05:36 AM 5.7 (H) 4.8 - 5.6 % Final    Comment:    (NOTE)         Prediabetes: 5.7 - 6.4         Diabetes: >6.4         Glycemic control for adults with diabetes: <7.0     CBG: Recent Labs  Lab 08/12/19 2332 08/13/19 0427  08/13/19 0740 08/13/19 0821 08/13/19 1159  GLUCAP 200* 156* 168* 162* 160*     Critical care time:     Critical care time: The patient is critically ill with multiple organ systems failure and requires high complexity decision making for assessment and support, frequent evaluation and titration of therapies, application of advanced monitoring technologies and extensive interpretation of multiple databases.  Critical care time 41 mins. This represents my time independent of the NPs time taking care of the pt. This is excluding procedures.    Audria Nine DO  Pulmonary and Critical Care 08/13/2019, 1:19 PM

## 2019-08-13 NOTE — Progress Notes (Signed)
Patients mother Karna Christmas at bedside. Dr. Orvan Seen, Ecmo specialist and RN able to answer questions and explain devices to mother. Was able to camera in patients wife at the same time so she was able to see patient and visit with Terri at the same time. It was a positive experience for all.

## 2019-08-13 NOTE — Progress Notes (Signed)
Patient ID: [name], male   DOB: 10/04/1975, 43 y.o.   MRN: [id number]    Advanced Heart Failure Rounding Note   Subjective:    Remains on VV ecmo with good flows. Had to be resedated and paralyzed overnight due to chugging. Circuit now quiet. Sats remain low 90s. BNP dropped with sedation and now on VP 0.03 with good BP. Now on bival. No bleeding this am. Received 1 pk PLTs this am.    CXR: Bilateral lung opacities c/w ARDS perhaps slightly better in upper zones Personally reviewed  Weight up another 9 pounds   On vent 50% PEEP 15. Driving pressure 12   ECMO parameters Flow 5.4 RPM 3500 dP 36 Pressure -104   Objective:   Weight Range:  Vital Signs:   Temp:  [97.7 F (36.5 C)-98.8 F (37.1 C)] 97.7 F (36.5 C) (01/05 0700) Pulse Rate:  [58-99] 60 (01/05 0700) Resp:  [11-80] 11 (01/05 0700) BP: (116-174)/(48-62) 144/61 (01/05 0700) SpO2:  [82 %-93 %] 92 % (01/05 0700) Arterial Line BP: (99-181)/(37-81) 144/61 (01/05 0700) FiO2 (%):  [50 %] 50 % (01/05 0700) Weight:  [134.2 kg] 134.2 kg (01/05 0439) Last BM Date: 08/11/19  Weight change: Filed Weights   08/09/19 0500 08/12/19 0500 08/13/19 0439  Weight: 129.7 kg 129.8 kg 134.2 kg    Intake/Output:   Intake/Output Summary (Last 24 hours) at 08/13/2019 0718 Last data filed at 08/13/2019 0634 Gross per 24 hour  Intake 6932.15 ml  Output 4125 ml  Net 2807.15 ml     Physical Exam: General:  Sedated on vent HEENT: normal Neck: supple. Hard to assess JVD. Carotids 2+ bilat; no bruits. No lymphadenopathy or thryomegaly appreciated. Cor: PMI nondisplaced. Regular rate & rhythm. No rubs, gallops or murmurs. Lungs: minimal air movement Abdomen: obese soft, nontender, nondistended. No hepatosplenomegaly. No bruits or masses. Good bowel sounds. Extremities: no cyanosis, clubbing, rash, 2-3+ edema caunnal sites ok Neuro: sedated on vent    Telemetry: Sinus 70-80s Personally reviewed  Labs: Basic Metabolic  Panel: Recent Labs  Lab 08/10/19 0412 08/10/19 0615 08/11/19 0509 08/11/19 1645 08/12/19 0420 08/12/19 1616 08/12/19 1640 08/12/19 2000 08/13/19 0427 08/13/19 0429  NA 140  --  133* 138 141 142 142 143 140 142  K 4.7  --  4.5 5.1 4.3 4.9 4.9 4.9 4.3 4.4  CL 108   < > 103 106 106 109  --   --  108  --   CO2 26   < > 25 26 27 25  --   --  23  --   GLUCOSE 161*   < > 149* 207* 145* 233*  --   --  171*  --   BUN 35*   < > 27* 25* 23* 24*  --   --  29*  --   CREATININE 0.49*   < > 0.42* 0.47* 0.49* 0.61  --   --  0.53*  --   CALCIUM 8.3*   < > 7.9* 8.0* 8.6* 8.8*  --   --  8.7*  --   MG 2.1  --   --   --   --   --   --   --   --   --   PHOS 3.4  --   --   --   --   --   --   --   --   --    < > = values in this interval not displayed.      Liver Function Tests: Recent Labs  Lab 08/09/19 0453 08/10/19 0412 08/11/19 0509 08/12/19 0420 08/13/19 0427  AST 32 _0 ALT 92* 67* 44 38 33  ALKPHOS 43 36* 37* 37* 43  BILITOT 1.0 0.8 0.7 0.8 0.6  PROT 4.5* 4.7* 4.3* 5.1* 5.1*  ALBUMIN 3.2* 3.3* 2.9* 3.7 3.4*   No results for input(s): LIPASE, AMYLASE in the last 168 hours. No results for input(s): AMMONIA in the last 168 hours.  CBC: Recent Labs  Lab 08/07/19 2157 08/08/19 0302 08/11/19 1645 08/11/19 1645 08/11/19 1649 08/11/19 2214 08/12/19 0420 08/12/19 1616 08/12/19 1640 08/12/19 2000 08/13/19 0427 08/13/19 0429  WBC 7.2   < > 8.8  --   --  9.8 10.1 9.6  --   --  8.5  --   NEUTROABS 4.7  --   --   --   --   --   --   --   --   --   --   --   HGB 8.1*  --  8.2*   < >  --  10.0* 9.9* 9.6* 9.9* 9.2* 8.9* 8.5*  HCT 25.8*  --  25.1*   < >  --  29.8* 29.3* 28.9* 29.0* 27.0* 27.7* 25.0*  MCV 90.5   < > 90.6  --   --  89.8 90.4 92.6  --   --  95.2  --   PLT 55*   < > 73*  --  74* 67* 71* 52*  --   --  48*  --    < > = values in this interval not displayed.    Cardiac Enzymes: No results for input(s): CKTOTAL, CKMB, CKMBINDEX, TROPONINI in the last 168  hours.  BNP: BNP (last 3 results) Recent Labs    07/22/19 1039  BNP 15.3    ProBNP (last 3 results) No results for input(s): PROBNP in the last 8760 hours.    Other results:  Imaging: DG CHEST PORT 1 VIEW  Result Date: 08/13/2019 CLINICAL DATA:  COVID-19 positive, recent ECMO 08/03/2019 EXAM: PORTABLE CHEST 1 VIEW COMPARISON:  Radiograph 08/12/2019 FINDINGS: Midline tracheostomy terminates in the mid trachea. Transesophageal feeding tube terminates below the level of imaging. Bilateral chest tubes and ECMO cannula remain in place. Right upper extremity PICC terminates at the superior cavoatrial junction. Subtotal opacification throughout both hemithoraces with air bronchograms in the right upper lung. Obscuration of the cardiac silhouette and both hemidiaphragms. No acute osseous or soft tissue abnormality is seen. IMPRESSION: 1. Subtotal opacification throughout both hemithoraces with developing air bronchograms in the right upper lung. 2. Support devices as above. Electronically Signed   By: Lovena Le M.D.   On: 08/13/2019 06:48   DG CHEST PORT 1 VIEW  Result Date: 08/12/2019 CLINICAL DATA:  COVID positive, ECMO. EXAM: PORTABLE CHEST 1 VIEW COMPARISON:  08/10/2019 and CT chest 07/30/2019. FINDINGS: Tracheostomy is midline. Feeding tube is followed into the stomach with the tip projecting beyond the inferior margin of the image. Left IJ central line has been removed with placement of a right PICC, tip in the region of the low SVC or SVC RA junction. Bilateral chest tubes and 2 ECMO cannulas are in place. No pneumothorax. Worsening severe bilateral airspace consolidation bilaterally, with near total opacification of both hemi thoraces. IMPRESSION: Worsening severe COVID-19 pneumonia. Electronically Signed   By: Lorin Picket M.D.   On: 08/12/2019 07:52   [place] EKG SITE RITE  Result Date: 08/11/2019 If [facility]  image not attached, placement could not be confirmed due to current cardiac  rhythm.  US EKG SITE RITE  Result Date: 08/11/2019 If Site Rite image not attached, placement could not be confirmed due to current cardiac rhythm.    Medications:     Scheduled Medications: . artificial tears  1 application Both Eyes Q8H  . vitamin C  500 mg Per Tube Daily  . chlorhexidine gluconate (MEDLINE KIT)  15 mL Mouth Rinse BID  . Chlorhexidine Gluconate Cloth  6 each Topical Daily  . clonazePAM  4 mg Per Tube BID  . dexamethasone (DECADRON) injection  1 mg Intravenous Q24H  . feeding supplement (PRO-STAT SUGAR FREE 64)  30 mL Per Tube BID  . free water  400 mL Per Tube Q8H  . gabapentin  300 mg Per Tube Q8H  . insulin aspart  1 Units Subcutaneous Q4H  . insulin aspart  3-9 Units Subcutaneous Q4H  . magnesium hydroxide  15 mL Oral Daily  . mouth rinse  15 mL Mouth Rinse 10 times per day  . oxyCODONE  15 mg Per Tube Q6H  . oxymetazoline  1 spray Each Nare BID  . pantoprazole (PROTONIX) IV  40 mg Intravenous QHS  . QUEtiapine  50 mg Per Tube BID  . senna-docusate  1 tablet Per Tube BID  . sodium chloride flush  10-40 mL Intracatheter Q12H  . sodium chloride flush  10-40 mL Intracatheter Q12H  . Thrombi-Pad  1 each Topical Once  . zinc sulfate  220 mg Per Tube Daily    Infusions: . sodium chloride    . sodium chloride 10 mL/hr at 08/13/19 0600  . albumin human Stopped (08/12/19 2132)  . bivalirudin (ANGIOMAX) infusion 0.5 mg/mL (Non-ACS indications) 0.06 mg/kg/hr (08/13/19 0600)  . ceFEPime (MAXIPIME) IV Stopped (08/12/19 2359)  . cisatracurium (NIMBEX) infusion 3 mcg/kg/min (08/13/19 0633)  . dextrose    . feeding supplement (PIVOT 1.5 CAL) 1,000 mL (08/12/19 1852)  . HYDROmorphone 6 mg/hr (08/13/19 0600)  . midazolam (VERSED) infusion 25 mg/hr (08/13/19 0600)  . propofol (DIPRIVAN) infusion 80 mcg/kg/min (08/13/19 0634)  . vancomycin Stopped (08/13/19 0206)  . vasopressin (PITRESSIN) infusion - *FOR SHOCK* 0.03 Units/min (08/13/19 0600)    PRN  Medications: Place/Maintain arterial line **AND** sodium chloride, sodium chloride, acetaminophen, albumin human, dextrose, hydrALAZINE, HYDROmorphone, labetalol, metoprolol tartrate, midazolam, ondansetron (ZOFRAN) IV, sodium chloride, sodium chloride flush, sodium chloride flush, sodium chloride flush   Assessment/Plan:    1. Acute hypoxic/hypercarbic respiratory failure due to COVID PNA: Remained markedly hypoxic despite full support. Intubated 12/26. S/p bilateral CTs 12/26 for pneumothoraces.  TEE on 12/26 LVEF 70% RV ok. VV ECMO begun 12/26. Day #9 VV ECMO. Tracheostomy 12/29.  COVID ARDS at this point.  Flows stable and sats improved with deeper sedation and re-paralysis  - ECMO parameters stable but requiring sedation and paralytics - Volume elevated. Has not tolerated any diuresis but with sedation will re-attempt today with back-up albumin as needed. Give lasix 20IV - Sats running low 90s (improved with paralytics) Lungs not participating much in gas exchange at this point. No recirc with normal svo2. Post pump ABG yesterday paO2 369. - PLTs 48k this am. LDH 314 - CXR slightly improved Personally reviewed - Discussed the possible addition of a small RIJ venous drainage cannula but felt that current flows are ok and it would not improve chugging due to cough and bearing down as this was from external venous compression on circuit - VP added on   1/4 for hypotension  2. Bilateral PTX: Due to barotrauma.  - Driving pressure minimized at 12-13  3. COVID PNA: management of COVID infection per CCM. CXR with bilateral multifocal PNA progressing to likely ARDS.  - s/p 10 days of remdesivir - 12/16, 12/2 s/p tociluzimab - 12/17 s/p convalescent plasma - On decadron at 1mg/daily until 1/11 - On cefepime and vanc - see below  4. ID: Empiric coverage with vancomycin/cefepime initially for possible secondary bacterial PNA, course completed.   - now back on cefepime and vanc for + cx with MSSE  and serratia (sensitivities pending). Discussed dosing with PharmD personally.  5. Thrombocytopenia: Slow decrease in platelets, likely low level hemolysis + critical illness.  - switched to bival on 1/4. HIT pending - 48k ths am. 1 pack ordered  6. Anemia: Bleeding at trach site resolved but still bleeding at right chest tube site and CVL. - hgb stable at 8.5 - Transfuse hgb < 8   7. FEN - continue TFs   Length of Stay: 22   CRITICAL CARE Performed by:    Total critical care time: 45 minutes  Critical care time was exclusive of separately billable procedures and treating other patients.  Critical care was necessary to treat or prevent imminent or life-threatening deterioration.  Critical care was time spent personally by me on the following activities: development of treatment plan with patient and/or surrogate as well as nursing, discussions with consultants, evaluation of patient's response to treatment, examination of patient, obtaining history from patient or surrogate, ordering and performing treatments and interventions, ordering and review of laboratory studies, ordering and review of radiographic studies, pulse oximetry and re-evaluation of patient's condition.      MD 08/13/2019, 7:18 AM  Advanced Heart Failure Team Pager [phone] (M-F; 7a - 4p)  Please contact [facility] for night-coverage after hours (4p -7a ) and weekends on [email or web address]  

## 2019-08-13 NOTE — Progress Notes (Signed)
ANTICOAGULATION CONSULT NOTE  Pharmacy Consult for bivalirudin Indication: ECMO  No Known Allergies  Patient Measurements: Height: 5' 8"  (172.7 cm) Weight: 295 lb 13.7 oz (134.2 kg) IBW/kg (Calculated) : 68.4 Heparin Dosing Weight: 100.7 kg  Vital Signs: Temp: 99.1 F (37.3 C) (01/05 1200) Temp Source: Oral (01/05 1200) BP: 142/56 (01/05 1525) Pulse Rate: 64 (01/05 1525)  Labs: Recent Labs    08/11/19 1649 08/11/19 2033 08/11/19 2113 08/12/19 0420 08/12/19 0420 08/12/19 0441 08/12/19 1327 08/12/19 1616 08/12/19 2332 08/13/19 0427 08/13/19 0429 08/13/19 1639  HGB   < >  --   --  9.9*   < >  --   --  9.6*  --  8.9* 8.5* 8.8*  HCT   < >  --   --  29.3*   < >  --   --  28.9*  --  27.7* 25.0* 28.0*  PLT   < >  --   --  71*  --   --   --  52*  --  48*  --  58*  APTT   < > 53*  --  53*   < >   < >  --  48* 52* 52*  --  56*  LABPROT  --   --  14.8 14.9  --   --   --   --   --  17.9*  --   --   INR  --   --  1.2 1.2  --   --   --   --   --  1.5*  --   --   HEPARINUNFRC  --  0.16*  --  0.17*  --   --  <0.10*  --   --   --   --   --   CREATININE  --   --   --  0.49*  --   --   --  0.61  --  0.53*  --  0.50*   < > = values in this interval not displayed.    Estimated Creatinine Clearance: 156.2 mL/min (A) (by C-G formula based on SCr of 0.5 mg/dL (L)).  Assessment: 44 yo male known COVID+ and ARDS VV ECMO switched to bivalirudin due to thrombocytopenia.  Heparin induced antibody in process.  -aPTT= 56 on bivalirudin at 0.06 mg/kg/hr -heparin antibody is negative    Goal of Therapy:  APTT goal: 50-80 sec Monitor platelets by anticoagulation protocol: Yes   Plan:  Continue bivalirudin at 0.06 mg/kg/hr Monitor aPTT every 12 hours, CBC, and for s/sx of bleeding Could consider transition back to heparin   Hildred Laser, PharmD Clinical Pharmacist **Pharmacist phone directory can now be found on [email or web address] ([email or web address]).  Listed under [place].

## 2019-08-13 NOTE — Progress Notes (Signed)
Palliative Medicine RN Note: FMLA forms completed. Updated wife [name]. Sent originals to [employer] and emailed copy to Mrs Bricen Victory. Que Meneely, RN, BSN, [facility] Palliative Medicine Team 08/13/2019 3:33 PM Office [phone]

## 2019-08-13 NOTE — Progress Notes (Signed)
ANTICOAGULATION CONSULT NOTE  Pharmacy Consult for bivalirudin Indication: ECMO  No Known Allergies  Patient Measurements: Height: 5' 8"  (172.7 cm) Weight: 295 lb 13.7 oz (134.2 kg) IBW/kg (Calculated) : 68.4 Heparin Dosing Weight: 100.7 kg  Vital Signs: Temp: 97.7 F (36.5 C) (01/05 0716) Temp Source: Core (01/05 0700) BP: 138/57 (01/05 0716) Pulse Rate: 61 (01/05 0716)  Labs: Recent Labs    08/11/19 1649 08/11/19 2033 08/11/19 2113 08/12/19 0420 08/12/19 0441 08/12/19 1327 08/12/19 1616 08/12/19 2000 08/12/19 2332 08/13/19 0427 08/13/19 0429  HGB   < >  --   --  9.9*  --   --  9.6* 9.2*  --  8.9* 8.5*  HCT   < >  --   --  29.3*  --   --  28.9* 27.0*  --  27.7* 25.0*  PLT   < >  --   --  71*  --   --  52*  --   --  48*  --   APTT   < > 53*  --  53*   < >  --  48*  --  52* 52*  --   LABPROT  --   --  14.8 14.9  --   --   --   --   --  17.9*  --   INR  --   --  1.2 1.2  --   --   --   --   --  1.5*  --   HEPARINUNFRC  --  0.16*  --  0.17*  --  <0.10*  --   --   --   --   --   CREATININE  --   --   --  0.49*  --   --  0.61  --   --  0.53*  --    < > = values in this interval not displayed.    Estimated Creatinine Clearance: 156.2 mL/min (A) (by C-G formula based on SCr of 0.53 mg/dL (L)).  Assessment: 44 yo male known COVID+ and ARDS VV ECMO switched to bivalirudin due to thrombocytopenia.  Heparin induced antibody in process.   APTT remains therapeutic at 52, on bivalirudin@0 .06 mg/kg/hr. Hgb down slightly to 8.5, plt drifting down to 48. No oozing or bleeding noted.   Goal of Therapy:  APTT goal: 50-80 sec Monitor platelets by anticoagulation protocol: Yes   Plan:  Continue bivalirudin at 0.06 mg/kg/hr Monitor aPTT every 12 hours, CBC, and for s/sx of bleeding  Antonietta Jewel, PharmD, [place] Pharmacist  Phone: [phone]  Please check AMION for all [place] phone numbers After 10:00 PM, call [name] [phone] 08/13/2019 7:23 AM

## 2019-08-13 NOTE — Progress Notes (Signed)
Chaplain engaged in initial visit with Westin's mom.  Chaplain and mom prayed together.  Chaplain will continue to follow-up.

## 2019-08-14 ENCOUNTER — Inpatient Hospital Stay (HOSPITAL_COMMUNITY): Payer: Managed Care, Other (non HMO)

## 2019-08-14 DIAGNOSIS — Z9689 Presence of other specified functional implants: Secondary | ICD-10-CM

## 2019-08-14 LAB — POCT I-STAT 7, (LYTES, BLD GAS, ICA,H+H)
Acid-Base Excess: 1 mmol/L (ref 0.0–2.0)
Acid-Base Excess: 1 mmol/L (ref 0.0–2.0)
Acid-Base Excess: 2 mmol/L (ref 0.0–2.0)
Bicarbonate: 24.8 mmol/L (ref 20.0–28.0)
Bicarbonate: 25.5 mmol/L (ref 20.0–28.0)
Bicarbonate: 26.5 mmol/L (ref 20.0–28.0)
Calcium, Ion: 1.27 mmol/L (ref 1.15–1.40)
Calcium, Ion: 1.3 mmol/L (ref 1.15–1.40)
Calcium, Ion: 1.29 mmol/L (ref 1.15–1.40)
HCT: 26 % — ABNORMAL LOW (ref 39.0–52.0)
HCT: 29 % — ABNORMAL LOW (ref 39.0–52.0)
HCT: 27 % — ABNORMAL LOW (ref 39.0–52.0)
Hemoglobin: 8.8 g/dL — ABNORMAL LOW (ref 13.0–17.0)
Hemoglobin: 9.9 g/dL — ABNORMAL LOW (ref 13.0–17.0)
Hemoglobin: 9.2 g/dL — ABNORMAL LOW (ref 13.0–17.0)
O2 Saturation: 91 %
O2 Saturation: 90 %
O2 Saturation: 90 %
Patient temperature: 98.9
Patient temperature: 98.3
Patient temperature: 98.2
Potassium: 4.2 mmol/L (ref 3.5–5.1)
Potassium: 4.4 mmol/L (ref 3.5–5.1)
Potassium: 4 mmol/L (ref 3.5–5.1)
Sodium: 142 mmol/L (ref 135–145)
Sodium: 145 mmol/L (ref 135–145)
Sodium: 146 mmol/L — ABNORMAL HIGH (ref 135–145)
TCO2: 26 mmol/L (ref 22–32)
TCO2: 27 mmol/L (ref 22–32)
TCO2: 28 mmol/L (ref 22–32)
pCO2 arterial: 37.2 mmHg (ref 32.0–48.0)
pCO2 arterial: 37.7 mmHg (ref 32.0–48.0)
pCO2 arterial: 40.3 mmHg (ref 32.0–48.0)
pH, Arterial: 7.434 (ref 7.350–7.450)
pH, Arterial: 7.438 (ref 7.350–7.450)
pH, Arterial: 7.425 (ref 7.350–7.450)
pO2, Arterial: 59 mmHg — ABNORMAL LOW (ref 83.0–108.0)
pO2, Arterial: 57 mmHg — ABNORMAL LOW (ref 83.0–108.0)
pO2, Arterial: 58 mmHg — ABNORMAL LOW (ref 83.0–108.0)

## 2019-08-14 LAB — APTT
aPTT: 56 seconds — ABNORMAL HIGH (ref 24–36)
aPTT: 73 seconds — ABNORMAL HIGH (ref 24–36)

## 2019-08-14 LAB — BASIC METABOLIC PANEL
Anion gap: 10 (ref 5–15)
Anion gap: 8 (ref 5–15)
BUN: 26 mg/dL — ABNORMAL HIGH (ref 6–20)
BUN: 27 mg/dL — ABNORMAL HIGH (ref 6–20)
CO2: 23 mmol/L (ref 22–32)
CO2: 25 mmol/L (ref 22–32)
Calcium: 8.8 mg/dL — ABNORMAL LOW (ref 8.9–10.3)
Calcium: 9 mg/dL (ref 8.9–10.3)
Chloride: 108 mmol/L (ref 98–111)
Chloride: 111 mmol/L (ref 98–111)
Creatinine, Ser: 0.43 mg/dL — ABNORMAL LOW (ref 0.61–1.24)
Creatinine, Ser: 0.48 mg/dL — ABNORMAL LOW (ref 0.61–1.24)
GFR calc Af Amer: >60 mL/min (ref >60)
GFR calc Af Amer: >60 mL/min (ref >60)
GFR calc non Af Amer: >60 mL/min (ref >60)
GFR calc non Af Amer: >60 mL/min (ref >60)
Glucose, Bld: 156 mg/dL — ABNORMAL HIGH (ref 70–99)
Glucose, Bld: 176 mg/dL — ABNORMAL HIGH (ref 70–99)
Potassium: 4.2 mmol/L (ref 3.5–5.1)
Potassium: 4.4 mmol/L (ref 3.5–5.1)
Sodium: 141 mmol/L (ref 135–145)
Sodium: 144 mmol/L (ref 135–145)

## 2019-08-14 LAB — PREPARE PLATELET PHERESIS: Unit division: 0

## 2019-08-14 LAB — GLUCOSE, CAPILLARY
Glucose-Capillary: 105 mg/dL — ABNORMAL HIGH (ref 70–99)
Glucose-Capillary: 124 mg/dL — ABNORMAL HIGH (ref 70–99)
Glucose-Capillary: 125 mg/dL — ABNORMAL HIGH (ref 70–99)
Glucose-Capillary: 138 mg/dL — ABNORMAL HIGH (ref 70–99)
Glucose-Capillary: 141 mg/dL — ABNORMAL HIGH (ref 70–99)
Glucose-Capillary: 153 mg/dL — ABNORMAL HIGH (ref 70–99)
Glucose-Capillary: 162 mg/dL — ABNORMAL HIGH (ref 70–99)

## 2019-08-14 LAB — HEPATIC FUNCTION PANEL
ALT: 33 U/L (ref 0–44)
AST: 21 U/L (ref 15–41)
Albumin: 3.1 g/dL — ABNORMAL LOW (ref 3.5–5.0)
Alkaline Phosphatase: 46 U/L (ref 38–126)
Bilirubin, Direct: 0.2 mg/dL (ref 0.0–0.2)
Indirect Bilirubin: 0.7 mg/dL (ref 0.3–0.9)
Total Bilirubin: 0.9 mg/dL (ref 0.3–1.2)
Total Protein: 5.1 g/dL — ABNORMAL LOW (ref 6.5–8.1)

## 2019-08-14 LAB — CBC
HCT: 28.5 % — ABNORMAL LOW (ref 39.0–52.0)
HCT: 29.5 % — ABNORMAL LOW (ref 39.0–52.0)
Hemoglobin: 8.9 g/dL — ABNORMAL LOW (ref 13.0–17.0)
Hemoglobin: 9.3 g/dL — ABNORMAL LOW (ref 13.0–17.0)
MCH: 30.4 pg (ref 26.0–34.0)
MCH: 30.5 pg (ref 26.0–34.0)
MCHC: 31.2 g/dL (ref 30.0–36.0)
MCHC: 31.5 g/dL (ref 30.0–36.0)
MCV: 97.3 fL (ref 80.0–100.0)
MCV: 96.7 fL (ref 80.0–100.0)
Platelets: 53 10*3/uL — ABNORMAL LOW (ref 150–400)
Platelets: 53 10*3/uL — ABNORMAL LOW (ref 150–400)
RBC: 2.93 MIL/uL — ABNORMAL LOW (ref 4.22–5.81)
RBC: 3.05 MIL/uL — ABNORMAL LOW (ref 4.22–5.81)
RDW: 19 % — ABNORMAL HIGH (ref 11.5–15.5)
RDW: 19.3 % — ABNORMAL HIGH (ref 11.5–15.5)
WBC: 8.4 10*3/uL (ref 4.0–10.5)
WBC: 7.6 10*3/uL (ref 4.0–10.5)
nRBC: 2 % — ABNORMAL HIGH (ref 0.0–0.2)
nRBC: 1.4 % — ABNORMAL HIGH (ref 0.0–0.2)

## 2019-08-14 LAB — BPAM PLATELET PHERESIS
Blood Product Expiration Date: 202101062359
ISSUE DATE / TIME: 202101050656
Unit Type and Rh: 6200

## 2019-08-14 LAB — LACTIC ACID, PLASMA: Lactic Acid, Venous: 1.5 mmol/L (ref 0.5–1.9)

## 2019-08-14 LAB — VANCOMYCIN, TROUGH: Vancomycin Tr: 18 ug/mL (ref 15–20)

## 2019-08-14 LAB — PROTIME-INR
INR: 1.4 — ABNORMAL HIGH (ref 0.8–1.2)
Prothrombin Time: 16.9 seconds — ABNORMAL HIGH (ref 11.4–15.2)

## 2019-08-14 LAB — LACTATE DEHYDROGENASE: LDH: 396 U/L — ABNORMAL HIGH (ref 98–192)

## 2019-08-14 LAB — TRIGLYCERIDES: Triglycerides: 247 mg/dL — ABNORMAL HIGH (ref <150)

## 2019-08-14 LAB — CULTURE, RESPIRATORY W GRAM STAIN

## 2019-08-14 LAB — COOXEMETRY PANEL
Carboxyhemoglobin: 3.8 % — ABNORMAL HIGH (ref 0.5–1.5)
Methemoglobin: 2.3 % — ABNORMAL HIGH (ref 0.0–1.5)
O2 Saturation: 75.6 %
Total hemoglobin: 8.8 g/dL — ABNORMAL LOW (ref 12.0–16.0)

## 2019-08-14 LAB — HEPARIN LEVEL (UNFRACTIONATED): Heparin Unfractionated: 0.23 IU/mL — ABNORMAL LOW (ref 0.30–0.70)

## 2019-08-14 LAB — FIBRINOGEN: Fibrinogen: 363 mg/dL (ref 210–475)

## 2019-08-14 MED ORDER — MORPHINE SULFATE (CONCENTRATE) 10 MG/0.5ML PO SOLN
10.0000 mg | ORAL | Status: DC
  Administered 2019-08-14 – 2019-08-16 (×10): 10 mg via ORAL
  Filled 2019-08-14 (×10): qty 0.5

## 2019-08-14 MED ORDER — FREE WATER
250.0000 mL | Freq: Three times a day (TID) | Status: DC
  Administered 2019-08-14 – 2019-08-16 (×5): 250 mL

## 2019-08-14 MED ORDER — FUROSEMIDE 10 MG/ML IJ SOLN
20.0000 mg | Freq: Once | INTRAMUSCULAR | Status: AC
  Administered 2019-08-14: 20 mg via INTRAVENOUS
  Filled 2019-08-14: qty 2

## 2019-08-14 MED ORDER — HEPARIN (PORCINE) 25000 UT/250ML-% IV SOLN
2700.0000 [IU]/h | INTRAVENOUS | Status: DC
  Administered 2019-08-14: 1500 [IU]/h via INTRAVENOUS
  Administered 2019-08-15 – 2019-08-16 (×3): 1600 [IU]/h via INTRAVENOUS
  Administered 2019-08-17: 1550 [IU]/h via INTRAVENOUS
  Administered 2019-08-18 – 2019-08-19 (×4): 1700 [IU]/h via INTRAVENOUS
  Administered 2019-08-20: 1950 [IU]/h via INTRAVENOUS
  Administered 2019-08-21: 2000 [IU]/h via INTRAVENOUS
  Administered 2019-08-21 – 2019-08-22 (×3): 2100 [IU]/h via INTRAVENOUS
  Administered 2019-08-23: 2300 [IU]/h via INTRAVENOUS
  Administered 2019-08-23: 2200 [IU]/h via INTRAVENOUS
  Administered 2019-08-24: 2400 [IU]/h via INTRAVENOUS
  Administered 2019-08-24: 2500 [IU]/h via INTRAVENOUS
  Administered 2019-08-24: 2300 [IU]/h via INTRAVENOUS
  Administered 2019-08-25 – 2019-08-26 (×2): 2600 [IU]/h via INTRAVENOUS
  Administered 2019-08-26: 2650 [IU]/h via INTRAVENOUS
  Administered 2019-08-26: 2600 [IU]/h via INTRAVENOUS
  Administered 2019-08-27: 2700 [IU]/h via INTRAVENOUS
  Administered 2019-08-27: 2650 [IU]/h via INTRAVENOUS
  Administered 2019-08-28: 2700 [IU]/h via INTRAVENOUS
  Filled 2019-08-14 (×27): qty 250

## 2019-08-14 MED ORDER — DIAZEPAM 1 MG/ML PO SOLN
5.0000 mg | Freq: Four times a day (QID) | ORAL | Status: DC
  Administered 2019-08-14 – 2019-08-16 (×7): 5 mg via ORAL
  Filled 2019-08-14 (×7): qty 5

## 2019-08-14 MED ORDER — FUROSEMIDE 10 MG/ML IJ SOLN
20.0000 mg | Freq: Once | INTRAMUSCULAR | Status: AC
  Administered 2019-08-14: 08:00:00 20 mg via INTRAVENOUS
  Filled 2019-08-14: qty 2

## 2019-08-14 NOTE — Progress Notes (Signed)
Patient ID: [name], male   DOB: 1975-11-15, 44 y.o.   MRN: [id number]    Advanced Heart Failure Rounding Note   Subjective:    Remains on VV ecmo with good flows. Remains sedated and paralyzed. Received 35m IV lasix yesterday with good diuresis and no chugging. Weight unchanged.   Mild oozing. No frank bleeding. Hgb 8.8 PLT 51 on bival.  Co-ox 76%  ABG 7.43/37/59/91%  CXR: Bilateral lung opacities c/w ARDS perhaps again slightly better in upper zones    On vent 50% PEEP 15. Driving pressure 12 No change. TV in 717    ECMO parameters Flow 5.5 RPM 3950 dP 36 -> 40 Pressure -108   Objective:   Weight Range:  Vital Signs:   Temp:  [98.2 F (36.8 C)-99.1 F (37.3 C)] 98.9 F (37.2 C) (01/06 0435) Pulse Rate:  [54-64] 58 (01/06 0700) Resp:  [10-39] 19 (01/06 0700) BP: (142-151)/(51-61) 151/61 (01/05 2355) SpO2:  [89 %-99 %] 93 % (01/06 0700) Arterial Line BP: (132-165)/(50-67) 147/56 (01/06 0700) FiO2 (%):  [50 %] 50 % (01/06 0353) Weight:  [134.2 kg] 134.2 kg (01/06 0500) Last BM Date: 08/13/19  Weight change: Filed Weights   08/12/19 0500 08/13/19 0439 08/14/19 0500  Weight: 129.8 kg 134.2 kg 134.2 kg    Intake/Output:   Intake/Output Summary (Last 24 hours) at 08/14/2019 0745 Last data filed at 08/14/2019 0732 Gross per 24 hour  Intake 6661.65 ml  Output 6181 ml  Net 480.65 ml     Physical Exam: General:  Sedated on vent HEENT: normal + nasal packing Neck: supple. Hard to see JVP. Carotids 2+ bilat; no bruits. No lymphadenopathy or thryomegaly appreciated. + trach Cor: PMI nondisplaced. Regular rate & rhythm. No rubs, gallops or murmurs. Lungs: clear Abdomen: obese soft, nontender, nondistended. No hepatosplenomegaly. No bruits or masses. Good bowel sounds. Extremities: no cyanosis, clubbing, rash, 2+ edema + femoral cannulas Neuro: sedated on vent    Telemetry: Sinus 70-80s Personally reviewed  Labs: Basic Metabolic Panel: Recent Labs   Lab 08/10/19 0412 08/10/19 0615 08/12/19 0420 08/12/19 1616 08/13/19 0427 08/13/19 1639 08/13/19 1750 08/13/19 1847 08/13/19 2056 08/14/19 0438 08/14/19 0441  NA 140  --  141 142 140 141 141 141 143 141 142  K 4.7  --  4.3 4.9 4.3 4.5 4.5 4.5 3.9 4.2 4.2  CL 108   < > 106 109 108 108  --   --   --  108  --   CO2 26   < > 27 25 23 24   --   --   --  23  --   GLUCOSE 161*   < > 145* 233* 171* 172*  --   --   --  156*  --   BUN 35*   < > 23* 24* 29* 30*  --   --   --  26*  --   CREATININE 0.49*   < > 0.49* 0.61 0.53* 0.50*  --   --   --  0.43*  --   CALCIUM 8.3*   < > 8.6* 8.8* 8.7* 8.6*  --   --   --  8.8*  --   MG 2.1  --   --   --   --   --   --   --   --   --   --   PHOS 3.4  --   --   --   --   --   --   --   --   --   --    < > =  values in this interval not displayed.    Liver Function Tests: Recent Labs  Lab 08/10/19 0412 08/11/19 0509 08/12/19 0420 08/13/19 0427 08/14/19 0438  AST 22 22 24 19 21   ALT 67* 44 38 33 33  ALKPHOS 36* 37* 37* 43 46  BILITOT 0.8 0.7 0.8 0.6 0.9  PROT 4.7* 4.3* 5.1* 5.1* 5.1*  ALBUMIN 3.3* 2.9* 3.7 3.4* 3.1*   No results for input(s): LIPASE, AMYLASE in the last 168 hours. No results for input(s): AMMONIA in the last 168 hours.  CBC: Recent Labs  Lab 08/07/19 2157 08/08/19 0302 08/12/19 0420 08/12/19 1616 08/13/19 0427 08/13/19 1639 08/13/19 1750 08/13/19 1847 08/13/19 2056 08/14/19 0438 08/14/19 0441  WBC 7.2   < > 10.1 9.6 8.5 8.8  --   --   --  8.4  --   NEUTROABS 4.7  --   --   --   --   --   --   --   --   --   --   HGB 8.1*  --  9.9* 9.6* 8.9* 8.8* 9.9* 9.5* 7.8* 8.9* 8.8*  HCT 25.8*  --  29.3* 28.9* 27.7* 28.0* 29.0* 28.0* 23.0* 28.5* 26.0*  MCV 90.5   < > 90.4 92.6 95.2 97.6  --   --   --  97.3  --   PLT 55*   < > 71* 52* 48* 58*  --   --   --  53*  --    < > = values in this interval not displayed.    Cardiac Enzymes: No results for input(s): CKTOTAL, CKMB, CKMBINDEX, TROPONINI in the last 168  hours.  BNP: BNP (last 3 results) Recent Labs    07/22/19 1039  BNP 15.3    ProBNP (last 3 results) No results for input(s): PROBNP in the last 8760 hours.    Other results:  Imaging: DG CHEST PORT 1 VIEW  Result Date: 08/14/2019 CLINICAL DATA:  COVID-19 positive, recent ECMO, persistent cardiorespiratory problems EXAM: PORTABLE CHEST 1 VIEW COMPARISON:  Radiograph 08/13/2019 FINDINGS: *Tracheostomy tube terminates in the mid trachea. *Transesophageal feeding tube terminates below the level of imaging. *Bilateral chest tubes are in stable position. *Right upper extremity PICC terminates at the expected location of the superior cavoatrial junction. *ECMO cannula projects over the low right chest Diminishing lung volumes with now complete opacification of the right hemithorax and only minimal visible aerated lung in the left apex. Cardiac silhouette and diaphragm are obscured. No acute osseous or soft tissue abnormality is seen. IMPRESSION: 1. Now complete opacification of the right hemithorax and only minimal visible aerated lung in the left apex. Likely reflecting either diminishing lung volumes or worsening airspace disease or effusion. 2. Stable position of support devices. Electronically Signed   By: Lovena Le M.D.   On: 08/14/2019 06:21   DG CHEST PORT 1 VIEW  Result Date: 08/13/2019 CLINICAL DATA:  COVID-19 positive, recent ECMO 08/03/2019 EXAM: PORTABLE CHEST 1 VIEW COMPARISON:  Radiograph 08/12/2019 FINDINGS: Midline tracheostomy terminates in the mid trachea. Transesophageal feeding tube terminates below the level of imaging. Bilateral chest tubes and ECMO cannula remain in place. Right upper extremity PICC terminates at the superior cavoatrial junction. Subtotal opacification throughout both hemithoraces with air bronchograms in the right upper lung. Obscuration of the cardiac silhouette and both hemidiaphragms. No acute osseous or soft tissue abnormality is seen. IMPRESSION: 1.  Subtotal opacification throughout both hemithoraces with developing air bronchograms in the right upper lung. 2. Support devices as  above. Electronically Signed   By: Lovena Le M.D.   On: 08/13/2019 06:48     Medications:     Scheduled Medications: . artificial tears  1 application Both Eyes K1S  . vitamin C  500 mg Per Tube Daily  . chlorhexidine gluconate (MEDLINE KIT)  15 mL Mouth Rinse BID  . Chlorhexidine Gluconate Cloth  6 each Topical Daily  . clonazePAM  4 mg Per Tube BID  . dexamethasone (DECADRON) injection  1 mg Intravenous Q24H  . feeding supplement (PRO-STAT SUGAR FREE 64)  30 mL Per Tube BID  . free water  250 mL Per Tube Q8H  . gabapentin  300 mg Per Tube Q8H  . insulin aspart  3-9 Units Subcutaneous Q4H  . magnesium hydroxide  15 mL Oral Daily  . mouth rinse  15 mL Mouth Rinse 10 times per day  . oxyCODONE  15 mg Per Tube Q6H  . oxymetazoline  1 spray Each Nare BID  . pantoprazole (PROTONIX) IV  40 mg Intravenous QHS  . QUEtiapine  50 mg Per Tube BID  . senna-docusate  1 tablet Per Tube BID  . sodium chloride flush  10-40 mL Intracatheter Q12H  . sodium chloride flush  10-40 mL Intracatheter Q12H  . Thrombi-Pad  1 each Topical Once  . zinc sulfate  220 mg Per Tube Daily    Infusions: . sodium chloride    . sodium chloride 10 mL/hr at 08/14/19 0700  . albumin human Stopped (08/12/19 2132)  . bivalirudin (ANGIOMAX) infusion 0.5 mg/mL (Non-ACS indications) 0.06 mg/kg/hr (08/14/19 0700)  . ceFEPime (MAXIPIME) IV Stopped (08/14/19 0034)  . cisatracurium (NIMBEX) infusion 3 mcg/kg/min (08/14/19 0700)  . dextrose    . feeding supplement (PIVOT 1.5 CAL) 1,000 mL (08/14/19 0656)  . HYDROmorphone 6 mg/hr (08/14/19 0700)  . midazolam (VERSED) infusion 25 mg/hr (08/14/19 0700)  . propofol (DIPRIVAN) infusion 60 mcg/kg/min (08/14/19 0700)  . vancomycin Stopped (08/14/19 0229)  . vasopressin (PITRESSIN) infusion - *FOR SHOCK* Stopped (08/13/19 1708)    PRN  Medications: Place/Maintain arterial line **AND** sodium chloride, sodium chloride, acetaminophen, albumin human, dextrose, hydrALAZINE, HYDROmorphone, labetalol, metoprolol tartrate, midazolam, ondansetron (ZOFRAN) IV, sodium chloride, sodium chloride flush, sodium chloride flush, sodium chloride flush   Assessment/Plan:    1. Acute hypoxic/hypercarbic respiratory failure due to COVID PNA: Remained markedly hypoxic despite full support. Intubated 12/26. S/p bilateral CTs 12/26 for pneumothoraces.  TEE on 12/26 LVEF 70% RV ok. VV ECMO begun 12/26. Day #9 VV ECMO. Tracheostomy 12/29.  COVID ARDS at this point.  Flows stable and sats improved with deeper sedation and re-paralysis  - ECMO parameters stable but requiring sedation and paralytics - Volume elevated. Tolerated lasix yesterday. Will repeat today. Can support with albumin as needed - Sats running low-mid 90s (improved with paralytics) Lungs not participating much in gas exchange at this point. TVs still in 70s   - PLTs 51k this am. LDH 314-> 396 - CXR slightly improved. Will continue diuresis Personally reviewed - VP added on 1/4 for hypotension. Now off. Can use as needed to support diuresis and sedation  2. Bilateral PTX: Due to barotrauma.  - Driving pressure minimized at 12-13. Stable this am   3. COVID PNA: management of COVID infection per CCM. CXR with bilateral multifocal PNA progressing to likely ARDS.  - s/p 10 days of remdesivir - 12/16, 12/2 s/p tociluzimab - 12/17 s/p convalescent plasma - On decadron at 13m/daily until 1/11 - On cefepime and vanc -  see below. Discussed dosing with PharmD personally.  4. ID: Empiric coverage with vancomycin/cefepime initially for possible secondary bacterial PNA, course completed.   - now back on cefepime and vanc for + cx with MSSE and serratia (sensitivities pending). Discussed dosing with PharmD personally. - Remains stable  5. Thrombocytopenia: Slow decrease in platelets, likely  low level hemolysis + critical illness.  - switched to bival on 1/4. HIT negative - 51k ths am. Recheck this afternoon - Less bleeding on bival. Will continue for now. Watch LDH  6. Anemia: Bleeding at trach site resolved but still bleeding at right chest tube site and CVL. - hgb stable at 8.8 - Transfuse hgb < 8   7. FEN - continue TFs  D/w with ECMO team and CCM at bedside on multidisciplinary rounds. Continue to await ARDS recovery.   Length of Stay: 23   CRITICAL CARE Performed by: Glori Bickers  Total critical care time: 45 minutes  Critical care time was exclusive of separately billable procedures and treating other patients.  Critical care was necessary to treat or prevent imminent or life-threatening deterioration.  Critical care was time spent personally by me on the following activities: development of treatment plan with patient and/or surrogate as well as nursing, discussions with consultants, evaluation of patient's response to treatment, examination of patient, obtaining history from patient or surrogate, ordering and performing treatments and interventions, ordering and review of laboratory studies, ordering and review of radiographic studies, pulse oximetry and re-evaluation of patient's condition.    Glori Bickers MD 08/14/2019, 7:45 AM  Advanced Heart Failure Team Pager [phone] (M-F; 7a - 4p)  Please contact [facility] for night-coverage after hours (4p -7a ) and weekends on [email or web address]

## 2019-08-14 NOTE — Progress Notes (Signed)
Patients mother updated via phone call.  All questions answered at this time.

## 2019-08-14 NOTE — Progress Notes (Addendum)
ANTICOAGULATION CONSULT NOTE  Pharmacy Consult for bivalirudin Indication: ECMO  No Known Allergies  Patient Measurements: Height: 5' 8"  (172.7 cm) Weight: 295 lb 13.7 oz (134.2 kg) IBW/kg (Calculated) : 68.4 Heparin Dosing Weight: 100.7 kg  Vital Signs: Temp: 98.2 F (36.8 C) (01/06 1953) Temp Source: Oral (01/06 1953) BP: 131/48 (01/06 1138) Pulse Rate: 69 (01/06 2100)  Labs: Recent Labs    08/12/19 0420 08/12/19 0441 08/12/19 1327 08/13/19 0427 08/13/19 1639 08/14/19 0438 08/14/19 1520 08/14/19 1602 08/14/19 2113 08/14/19 2122  HGB 9.9*  --   --  8.9* 8.8* 8.9* 9.3* 9.9*  --  9.2*  HCT 29.3*  --   --  27.7* 28.0* 28.5* 29.5* 29.0*  --  27.0*  PLT 71*   < >  --  48* 58* 53* 53*  --   --   --   APTT 53*   < >  --  52* 56* 56*  --   --  73*  --   LABPROT 14.9  --   --  17.9*  --  16.9*  --   --   --   --   INR 1.2  --   --  1.5*  --  1.4*  --   --   --   --   HEPARINUNFRC 0.17*  --  <0.10*  --   --   --   --   --  0.23*  --   CREATININE 0.49*   < >  --  0.53* 0.50* 0.43* 0.48*  --   --   --    < > = values in this interval not displayed.    Estimated Creatinine Clearance: 156.2 mL/min (A) (by C-G formula based on SCr of 0.48 mg/dL (L)).  Assessment: 44 yo male known COVID+ and ARDS VV ECMO. Heparin was restarted today (bivalirudin stopped as HIT test was negative)  -heparin level= 0.23 -aPTT= 73  Anticoagulation has been placed on hold recently due to bleeding. Heparin level goal was 0.3-0.4 and goal aPTT was 66=75 when he was on heparin previously.     Goal of Therapy:  APTT goal: 50-80 sec Monitor platelets by anticoagulation protocol: Yes   Plan:  -no heparin changes now -heparin level and aPTT in 6 hours then q12h  Hildred Laser, PharmD Clinical Pharmacist **Pharmacist phone directory can now be found on [email or web address] ([email or web address]).  Listed under [place].

## 2019-08-14 NOTE — Progress Notes (Signed)
NAME:  [name], MRN:  [id number], DOB:  16-Oct-1975, LOS: 23 ADMISSION DATE:  07/22/2019, CONSULTATION DATE:  12/24 REFERRING MD:  Sloan Leiter, CHIEF COMPLAINT:  Dyspnea   Brief History   44 y/o M admitted 12/14 with COVID pneumonia causing acute hypoxemic respiratory failure. Developed pneumomediastinum 12/23 with concern for possible bilateral small pneumothoraces on 12/24.  PCCM consulted for evaluation of pneumothoraces  Past Medical History  GERD HTN Asthma  Significant Hospital Events   12/14 admit with hypoxemic respiratory failure in setting of COVID-19 pneumonia 12/23 noted to have pneumomediastinum on chest x-ray 12/24 PCCM consulted for evaluation 12/26 worsening hypoxemia, intubated  12/27 VV fem/fem ECMO cannulation 12/28 ETT switch 12/29 Tracheostomy 12/31 Oozing from multiple sites, H/H drop. Heparin held briefly and lidocaine with epi injected around trach site, Cryo, DDVAP given 1/3 CVL being discontinued   Consults:  PCCM  Procedures:  1/4 bronch: bal performed  Significant Diagnostic Tests:  CT chest 12/22 >> extensive pneumomediastinum, multifocal patchy bilateral groundglass opacities  Micro Data:  BCx2 12/14 >> negative  Sputum 12/15 >> negative  1/4 bronch:  1/2 acid fast smear bronch: negative 1/2 fungal cx bronch: pending  Antimicrobials:  Received remdesivir, steroids, actemra and convalescent plasma  5 days cefepime 12/24-12/29   Interim history/subjective:  Nasal packing yesterday late afternoon. Sweep increased overnight. Remains chemically paralyzed which has improved chatter.  Heart Failure plans to diurese today.  Objective   Blood pressure (!) 151/61, pulse (!) 58, temperature 98.9 F (37.2 C), temperature source Oral, resp. rate 19, height _0  (1.727 m), weight 134.2 kg, SpO2 93 %.    Vent Mode: PCV FiO2 (%):  [50 %] 50 % Set Rate:  [15 bmp] 15 bmp PEEP:  [15 cmH20] 15 cmH20 Plateau Pressure:  [24 cmH20-29 cmH20] 29 cmH20    Intake/Output Summary (Last 24 hours) at 08/14/2019 0832 Last data filed at 08/14/2019 0825 Gross per 24 hour  Intake 6741.44 ml  Output 6841 ml  Net -99.56 ml   Filed Weights   08/12/19 0500 08/13/19 0439 08/14/19 0500  Weight: 129.8 kg 134.2 kg 134.2 kg   Examination GEN: Critically ill appearing middle aged male. Sedated and chemically paralyzed, trach/vent, on VV ECMO HEENT: [place]. Nasal packing with sanguinous saturation. Trach intact without bleeding. Pink mmm.  CV: RRR. 2+ radial pulses. No rgm.  PULM: Minimal chest expansion with very low tidal volumes. Expansion is symmetrical. Breath sounds are very diminished. Chest tubes with serosanguinous output.   GI: Obese, soft, round. Ndnt. Normoactive x4  EXT: Symmetrical bulk and tone without cyanosis or clubbing. Bilateral femoral cannulas, insertion sites are clean and dry without bleeding.  NEURO: Sedated and chemically paralyzed. PERR.  SKIN: Pale, cool, clean, dry. Few small, round, pustular eruptions across chest.   Resolved Hospital Problem list    Assessment & Plan:  Big picture: still on vent rest settings, issues ongoing with ECMO induced consumptive coagulopathy and inability to find happy medium with sedation.  Okay but tenuous with current catheter setup (chatters fairly frequently).  Overall prognosis remains guarded but still in single organ failure.  Shock, improved -off vasopressin    Acute hypoxemic respiratory failure with ARDS secondary to COVID-19, s/p tracheostomy - s/p remdesivir, toci, convalescent plasma - VV ECMO initiated 12/26 -Trached 12/29 - On edge with flows which is limiting our ability to lighten sedation (any sedation lightening- chattering), holding off on other outflow cannula at this time as chatter has improved with chemical paralysis  P -  Continue VV. Appreciate heart failure and CVTS involvement.  -very low tidals noted on vent and are ok -continues on decadron until 1/11   Staph epi and  serratia marcescens pneumonia:  -cefepime and vanc  Bilateral pneumothoraces and pneumomediastinum -No airleak in bilateral chest tubes P -Continue to waterseal for now -Can do topical epi if oozing continues to be on issue on R chest  Coagulopathy, thrombocytopenia - negative HIT  P - Remains on bivalirudin - possible transition back to heparin per pharmacy   Nose bleeds - repacked 1/5 after discussion with ENT P - cefepime/vanc (on for above pna) while packing in place for TSS prevention (ceftriaxone otherwise)  Hyperglycemia -SSI coverage  Pustular Rash -few amount of small round pustular eruptions on chest.  -suspect dermatitis but will continue to monitor for worsening  Best practice:  Feeding: EN  Analgesia: see above Sedation: see above Thromboprophylaxis: bivalirudin Ulcer prophylaxis: pantoprazole Glucose control: SSI Family: Per primary  Labs   CBC: Recent Labs  Lab 08/07/19 2157 08/08/19 0302 08/12/19 0420 08/12/19 1616 08/13/19 0427 08/13/19 1639 08/13/19 1750 08/13/19 1847 08/13/19 2056 08/14/19 0438 08/14/19 0441  WBC 7.2   < > 10.1 9.6 8.5 8.8  --   --   --  8.4  --   NEUTROABS 4.7  --   --   --   --   --   --   --   --   --   --   HGB 8.1*  --  9.9* 9.6* 8.9* 8.8* 9.9* 9.5* 7.8* 8.9* 8.8*  HCT 25.8*  --  29.3* 28.9* 27.7* 28.0* 29.0* 28.0* 23.0* 28.5* 26.0*  MCV 90.5   < > 90.4 92.6 95.2 97.6  --   --   --  97.3  --   PLT 55*   < > 71* 52* 48* 58*  --   --   --  53*  --    < > = values in this interval not displayed.    Basic Metabolic Panel: Recent Labs  Lab 08/10/19 0412 08/10/19 0615 08/12/19 0420 08/12/19 1616 08/13/19 0427 08/13/19 1639 08/13/19 1750 08/13/19 1847 08/13/19 2056 08/14/19 0438 08/14/19 0441  NA 140  --  141 142 140 141 141 141 143 141 142  K 4.7  --  4.3 4.9 4.3 4.5 4.5 4.5 3.9 4.2 4.2  CL 108   < > 106 109 108 108  --   --   --  108  --   CO2 26   < > _0 --   --   --  23  --   GLUCOSE 161*   <  > 145* 233* 171* 172*  --   --   --  156*  --   BUN 35*   < > 23* 24* 29* 30*  --   --   --  26*  --   CREATININE 0.49*   < > 0.49* 0.61 0.53* 0.50*  --   --   --  0.43*  --   CALCIUM 8.3*   < > 8.6* 8.8* 8.7* 8.6*  --   --   --  8.8*  --   MG 2.1  --   --   --   --   --   --   --   --   --   --   PHOS 3.4  --   --   --   --   --   --   --   --   --   --    < > =  values in this interval not displayed.   GFR: Estimated Creatinine Clearance: 156.2 mL/min (A) (by C-G formula based on SCr of 0.43 mg/dL (L)). Recent Labs  Lab 08/11/19 0515 08/12/19 0420 08/12/19 1616 08/13/19 0427 08/13/19 1639 08/14/19 0438  WBC  --  10.1 9.6 8.5 8.8 8.4  LATICACIDVEN 1.8 1.9  --  1.9  --  1.5    Liver Function Tests: Recent Labs  Lab 08/10/19 0412 08/11/19 0509 08/12/19 0420 08/13/19 0427 08/14/19 0438  AST _0 ALT 67* 44 38 33 33  ALKPHOS 36* 37* 37* 43 46  BILITOT 0.8 0.7 0.8 0.6 0.9  PROT 4.7* 4.3* 5.1* 5.1* 5.1*  ALBUMIN 3.3* 2.9* 3.7 3.4* 3.1*   No results for input(s): LIPASE, AMYLASE in the last 168 hours. No results for input(s): AMMONIA in the last 168 hours.  ABG    Component Value Date/Time   PHART 7.434 08/14/2019 0441   PCO2ART 37.2 08/14/2019 0441   PO2ART 59.0 (L) 08/14/2019 0441   HCO3 24.8 08/14/2019 0441   TCO2 26 08/14/2019 0441   ACIDBASEDEF 3.0 (H) 08/13/2019 2056   O2SAT 75.6 08/14/2019 0452     Coagulation Profile: Recent Labs  Lab 08/11/19 1649 08/11/19 2113 08/12/19 0420 08/13/19 0427 08/14/19 0438  INR 1.3* 1.2 1.2 1.5* 1.4*    Cardiac Enzymes: No results for input(s): CKTOTAL, CKMB, CKMBINDEX, TROPONINI in the last 168 hours.  HbA1C: Hgb A1c MFr Bld  Date/Time Value Ref Range Status  07/24/2019 04:50 AM 6.7 (H) 4.8 - 5.6 % Final    Comment:    (NOTE) Pre diabetes:          5.7%-6.4% Diabetes:              >6.4% Glycemic control for   <7.0% adults with diabetes   09/30/2017 05:36 AM 5.7 (H) 4.8 - 5.6 % Final    Comment:     (NOTE)         Prediabetes: 5.7 - 6.4         Diabetes: >6.4         Glycemic control for adults with diabetes: <7.0     CBG: Recent Labs  Lab 08/13/19 1159 08/13/19 1544 08/13/19 2055 08/14/19 0009 08/14/19 0438  GLUCAP 160* 153* 127* 125* 141*     Critical care time:     CRITICAL CARE Performed by: Cristal Generous   Total critical care time: 35 minutes  Critical care time was exclusive of separately billable procedures and treating other patients. Critical care was necessary to treat or prevent imminent or life-threatening deterioration.  Critical care was time spent personally by me on the following activities: development of treatment plan with patient and/or surrogate as well as nursing, discussions with consultants, evaluation of patient's response to treatment, examination of patient, obtaining history from patient or surrogate, ordering and performing treatments and interventions, ordering and review of laboratory studies, ordering and review of radiographic studies, pulse oximetry and re-evaluation of patient's condition.  Eliseo Gum MSN, AGACNP-BC [place] [phone] If no answer, [phone] 08/14/2019, 8:32 AM

## 2019-08-14 NOTE — Progress Notes (Signed)
Pt with worsening cxr despite extreme rest settings on ventilator, still at flow of 5-5.5 with sweep 10. Decision made to change settings for better TV while still maintaining peaks in mid to low 30's (as they have been on PC PEEP 15, RR15, with TV of 40-60).   Ultimate goal for vent settings prvc peep 10, rr 10-12, TV 4cc/kg IBW (or 3-5cc/kg IBW) with fio% 40 as tolerated. Goal sat >88%  At this time we have slowly started to transition to these goals. Plan will be to follow sats and abg as we make these changes. Monitor chest tubes (as they are not to suction at this time). Once pt has been on 4cc/kg IBW overnight send for CT chest tomorrow to better evaluate lungs.   Pt to remain sedated and paralyzed thru imaging for safety and then hopefully if all tolerated wean sedation and monitor closely as we remove paralytic.   D/w perfusionist/ecmo specialists RN/RT and myself.

## 2019-08-14 NOTE — Progress Notes (Signed)
SLP Cancellation Note  Patient Details Name: FREAD KOTTKE MRN: [id number] DOB: [birth year]   Cancelled treatment:       Reason Eval/Treat Not Completed: Patient not medically ready. SLP will continue to follow at a distance for possible PMV readiness.     [name]., M.A. [place] [facility] Pager [phone] Office [phone]  08/14/2019, 7:15 AM

## 2019-08-14 NOTE — Progress Notes (Addendum)
ANTICOAGULATION CONSULT NOTE  Pharmacy Consult for bivalirudin Indication: ECMO  No Known Allergies  Patient Measurements: Height: 5' 8"  (172.7 cm) Weight: 295 lb 13.7 oz (134.2 kg) IBW/kg (Calculated) : 68.4 Heparin Dosing Weight: 100.7 kg  Vital Signs: Temp: 98.9 F (37.2 C) (01/06 0435) Temp Source: Oral (01/06 0435) BP: 151/61 (01/05 2355) Pulse Rate: 58 (01/06 0700)  Labs: Recent Labs    08/11/19 2033 08/11/19 2113 08/12/19 0420 08/12/19 0441 08/12/19 1327 08/13/19 0427 08/13/19 1639 08/13/19 2056 08/14/19 0438 08/14/19 0441  HGB  --    < > 9.9*  --   --  8.9* 8.8* 7.8* 8.9* 8.8*  HCT  --    < > 29.3*  --   --  27.7* 28.0* 23.0* 28.5* 26.0*  PLT  --    < > 71*   < >  --  48* 58*  --  53*  --   APTT 53*  --  53*   < >  --  52* 56*  --  56*  --   LABPROT  --   --  14.9  --   --  17.9*  --   --  16.9*  --   INR  --   --  1.2  --   --  1.5*  --   --  1.4*  --   HEPARINUNFRC 0.16*  --  0.17*  --  <0.10*  --   --   --   --   --   CREATININE  --   --  0.49*   < >  --  0.53* 0.50*  --  0.43*  --    < > = values in this interval not displayed.    Estimated Creatinine Clearance: 156.2 mL/min (A) (by C-G formula based on SCr of 0.43 mg/dL (L)).  Assessment: 44 yo male known COVID+ and ARDS VV ECMO switched to bivalirudin due to thrombocytopenia.  Heparin induced antibody in process.  -aPTT= 56 on bivalirudin at 0.06 mg/kg/hr -heparin antibody is negative >> still to continue bivalirudin for 1/6 and assess.   Hgb 8.8, plt 53. Had nose bleeding yesterday - stable/improved overnight.   Goal of Therapy:  APTT goal: 50-80 sec Monitor platelets by anticoagulation protocol: Yes   Plan:  Continue bivalirudin at 0.06 mg/kg/hr Monitor aPTT every 12 hours, CBC, and for s/sx of bleeding Could consider transition back to heparin   Antonietta Jewel, PharmD, [place] Pharmacist  Phone: [phone]  Please check AMION for all [place] phone numbers After 10:00 PM, call  [place] [phone]  ADDENDUM Discussed with team and plan to switch back to heparin infusion given HIT negative. Will stop bivalirudin and start heparin infusion at 1500 units/hr. Will order aPTT/HL in 6 hours. Will monitor for any bleeding.   Antonietta Jewel, PharmD, [place] Clinical Pharmacist

## 2019-08-14 NOTE — Progress Notes (Signed)
Pharmacy Antibiotic Note  [name] is a 44 y.o. male on ECMO. Pharmacy dosing cefepime and vancomycin for PNA  -WBC= 7.6, afebrile, SCr= 0.48 -BAL cultures with staph aureus (pan-S) and serratia marc -vancomycin trough= 18   Plan: -no vancomycin changes needed -Will follow renal function, cultures and clinical progress   Height: _0  (172.7 cm) Weight: 295 lb 13.7 oz (134.2 kg) IBW/kg (Calculated) : 68.4  Temp (24hrs), Avg:98.4 F (36.9 C), Min:98.1 F (36.7 C), Max:98.9 F (37.2 C)  Recent Labs  Lab 08/10/19 0412 08/10/19 1319 08/11/19 0515 08/11/19 1337 08/12/19 0420 08/12/19 1616 08/13/19 0427 08/13/19 1639 08/14/19 0438 08/14/19 1520  WBC 10.3  --   --   --  10.1 9.6 8.5 8.8 8.4 7.6  CREATININE 0.49*   < >  --    < > 0.49* 0.61 0.53* 0.50* 0.43* 0.48*  LATICACIDVEN 1.8  --  1.8  --  1.9  --  1.9  --  1.5  --   VANCOTROUGH  --   --   --   --   --   --   --   --   --  18   < > = values in this interval not displayed.    Estimated Creatinine Clearance: 156.2 mL/min (A) (by C-G formula based on SCr of 0.48 mg/dL (L)).    No Known Allergies  Antimicrobials this admission: Remdesivir 12/14>>12/23 Actemra x1 12/16; 12/22 Plasma 12/17 Vanco 12/26 >>12/28, 1/4>> Cefepime 12/26 >>12/30, 1/4>>  Dose adjustments this admission:   Microbiology results: 12/14 BCx: ngF 12/14 sputum: Normal respiratory flora  12/25 MRSA PCR neg  12/28 trach: few MSSA 12/28 BCx: ngF 1/2 bronch: >100 k serratia marcescens (R cefazolin), >100 k staph epidermidis (S vanc, tetra) 1/4 BAL: MSSA, serratia marcescens (R cefazolin)  Thank you for allowing pharmacy to be a part of this patient's care.  Hildred Laser, PharmD Clinical Pharmacist **Pharmacist phone directory can now be found on [email or web address] ([email or web address]).  Listed under [place].

## 2019-08-15 ENCOUNTER — Encounter (HOSPITAL_COMMUNITY): Payer: Self-pay | Admitting: Certified Registered"

## 2019-08-15 ENCOUNTER — Inpatient Hospital Stay (HOSPITAL_COMMUNITY): Payer: Managed Care, Other (non HMO)

## 2019-08-15 LAB — BASIC METABOLIC PANEL
Anion gap: 9 (ref 5–15)
Anion gap: 9 (ref 5–15)
BUN: 28 mg/dL — ABNORMAL HIGH (ref 6–20)
BUN: 30 mg/dL — ABNORMAL HIGH (ref 6–20)
CO2: 24 mmol/L (ref 22–32)
CO2: 24 mmol/L (ref 22–32)
Calcium: 8.6 mg/dL — ABNORMAL LOW (ref 8.9–10.3)
Calcium: 9 mg/dL (ref 8.9–10.3)
Chloride: 113 mmol/L — ABNORMAL HIGH (ref 98–111)
Chloride: 110 mmol/L (ref 98–111)
Creatinine, Ser: 0.52 mg/dL — ABNORMAL LOW (ref 0.61–1.24)
Creatinine, Ser: 0.6 mg/dL — ABNORMAL LOW (ref 0.61–1.24)
GFR calc Af Amer: >60 mL/min (ref >60)
GFR calc Af Amer: >60 mL/min (ref >60)
GFR calc non Af Amer: >60 mL/min (ref >60)
GFR calc non Af Amer: >60 mL/min (ref >60)
Glucose, Bld: 126 mg/dL — ABNORMAL HIGH (ref 70–99)
Glucose, Bld: 150 mg/dL — ABNORMAL HIGH (ref 70–99)
Potassium: 3.8 mmol/L (ref 3.5–5.1)
Potassium: 4.2 mmol/L (ref 3.5–5.1)
Sodium: 146 mmol/L — ABNORMAL HIGH (ref 135–145)
Sodium: 143 mmol/L (ref 135–145)

## 2019-08-15 LAB — HEPARIN LEVEL (UNFRACTIONATED)
Heparin Unfractionated: 0.25 IU/mL — ABNORMAL LOW (ref 0.30–0.70)
Heparin Unfractionated: 0.31 IU/mL (ref 0.30–0.70)
Heparin Unfractionated: 0.31 IU/mL (ref 0.30–0.70)

## 2019-08-15 LAB — POCT I-STAT 7, (LYTES, BLD GAS, ICA,H+H)
Acid-Base Excess: 1 mmol/L (ref 0.0–2.0)
Acid-Base Excess: 1 mmol/L (ref 0.0–2.0)
Acid-Base Excess: 2 mmol/L (ref 0.0–2.0)
Bicarbonate: 25.4 mmol/L (ref 20.0–28.0)
Bicarbonate: 24.8 mmol/L (ref 20.0–28.0)
Bicarbonate: 26.3 mmol/L (ref 20.0–28.0)
Calcium, Ion: 1.31 mmol/L (ref 1.15–1.40)
Calcium, Ion: 1.25 mmol/L (ref 1.15–1.40)
Calcium, Ion: 1.29 mmol/L (ref 1.15–1.40)
HCT: 26 % — ABNORMAL LOW (ref 39.0–52.0)
HCT: 30 % — ABNORMAL LOW (ref 39.0–52.0)
HCT: 30 % — ABNORMAL LOW (ref 39.0–52.0)
Hemoglobin: 8.8 g/dL — ABNORMAL LOW (ref 13.0–17.0)
Hemoglobin: 10.2 g/dL — ABNORMAL LOW (ref 13.0–17.0)
Hemoglobin: 10.2 g/dL — ABNORMAL LOW (ref 13.0–17.0)
O2 Saturation: 93 %
O2 Saturation: 91 %
O2 Saturation: 92 %
Patient temperature: 98.2
Patient temperature: 98.7
Patient temperature: 98.3
Potassium: 3.9 mmol/L (ref 3.5–5.1)
Potassium: 4.3 mmol/L (ref 3.5–5.1)
Potassium: 4 mmol/L (ref 3.5–5.1)
Sodium: 146 mmol/L — ABNORMAL HIGH (ref 135–145)
Sodium: 145 mmol/L (ref 135–145)
Sodium: 145 mmol/L (ref 135–145)
TCO2: 27 mmol/L (ref 22–32)
TCO2: 26 mmol/L (ref 22–32)
TCO2: 27 mmol/L (ref 22–32)
pCO2 arterial: 36.8 mmHg (ref 32.0–48.0)
pCO2 arterial: 36.4 mmHg (ref 32.0–48.0)
pCO2 arterial: 38.1 mmHg (ref 32.0–48.0)
pH, Arterial: 7.446 (ref 7.350–7.450)
pH, Arterial: 7.442 (ref 7.350–7.450)
pH, Arterial: 7.446 (ref 7.350–7.450)
pO2, Arterial: 62 mmHg — ABNORMAL LOW (ref 83.0–108.0)
pO2, Arterial: 58 mmHg — ABNORMAL LOW (ref 83.0–108.0)
pO2, Arterial: 61 mmHg — ABNORMAL LOW (ref 83.0–108.0)

## 2019-08-15 LAB — CBC
HCT: 29.5 % — ABNORMAL LOW (ref 39.0–52.0)
HCT: 30.8 % — ABNORMAL LOW (ref 39.0–52.0)
Hemoglobin: 9.1 g/dL — ABNORMAL LOW (ref 13.0–17.0)
Hemoglobin: 9.5 g/dL — ABNORMAL LOW (ref 13.0–17.0)
MCH: 30.4 pg (ref 26.0–34.0)
MCH: 30.5 pg (ref 26.0–34.0)
MCHC: 30.8 g/dL (ref 30.0–36.0)
MCHC: 30.8 g/dL (ref 30.0–36.0)
MCV: 98.7 fL (ref 80.0–100.0)
MCV: 99 fL (ref 80.0–100.0)
Platelets: 54 10*3/uL — ABNORMAL LOW (ref 150–400)
Platelets: 52 10*3/uL — ABNORMAL LOW (ref 150–400)
RBC: 2.99 MIL/uL — ABNORMAL LOW (ref 4.22–5.81)
RBC: 3.11 MIL/uL — ABNORMAL LOW (ref 4.22–5.81)
RDW: 19.6 % — ABNORMAL HIGH (ref 11.5–15.5)
RDW: 19.2 % — ABNORMAL HIGH (ref 11.5–15.5)
WBC: 7.8 10*3/uL (ref 4.0–10.5)
WBC: 7.3 10*3/uL (ref 4.0–10.5)
nRBC: 1.3 % — ABNORMAL HIGH (ref 0.0–0.2)
nRBC: 1.4 % — ABNORMAL HIGH (ref 0.0–0.2)

## 2019-08-15 LAB — HEPATIC FUNCTION PANEL
ALT: 29 U/L (ref 0–44)
AST: 18 U/L (ref 15–41)
Albumin: 2.9 g/dL — ABNORMAL LOW (ref 3.5–5.0)
Alkaline Phosphatase: 46 U/L (ref 38–126)
Bilirubin, Direct: 0.1 mg/dL (ref 0.0–0.2)
Indirect Bilirubin: 0.8 mg/dL (ref 0.3–0.9)
Total Bilirubin: 0.9 mg/dL (ref 0.3–1.2)
Total Protein: 5.1 g/dL — ABNORMAL LOW (ref 6.5–8.1)

## 2019-08-15 LAB — APTT: aPTT: 66 seconds — ABNORMAL HIGH (ref 24–36)

## 2019-08-15 LAB — GLUCOSE, CAPILLARY
Glucose-Capillary: 120 mg/dL — ABNORMAL HIGH (ref 70–99)
Glucose-Capillary: 117 mg/dL — ABNORMAL HIGH (ref 70–99)
Glucose-Capillary: 151 mg/dL — ABNORMAL HIGH (ref 70–99)
Glucose-Capillary: 137 mg/dL — ABNORMAL HIGH (ref 70–99)
Glucose-Capillary: 83 mg/dL (ref 70–99)
Glucose-Capillary: 121 mg/dL — ABNORMAL HIGH (ref 70–99)

## 2019-08-15 LAB — PROTIME-INR
INR: 1.1 (ref 0.8–1.2)
Prothrombin Time: 14.3 seconds (ref 11.4–15.2)

## 2019-08-15 LAB — TRIGLYCERIDES: Triglycerides: 180 mg/dL — ABNORMAL HIGH (ref <150)

## 2019-08-15 LAB — LACTATE DEHYDROGENASE: LDH: 383 U/L — ABNORMAL HIGH (ref 98–192)

## 2019-08-15 LAB — COOXEMETRY PANEL
Carboxyhemoglobin: 3.8 % — ABNORMAL HIGH (ref 0.5–1.5)
Methemoglobin: 1.6 % — ABNORMAL HIGH (ref 0.0–1.5)
O2 Saturation: 83.9 %
Total hemoglobin: 10.9 g/dL — ABNORMAL LOW (ref 12.0–16.0)

## 2019-08-15 LAB — LACTIC ACID, PLASMA: Lactic Acid, Venous: 1.2 mmol/L (ref 0.5–1.9)

## 2019-08-15 LAB — FIBRINOGEN: Fibrinogen: 384 mg/dL (ref 210–475)

## 2019-08-15 MED ORDER — PIVOT 1.5 CAL PO LIQD
1000.0000 mL | ORAL | Status: DC
  Administered 2019-08-15 – 2019-08-20 (×8): 1000 mL
  Filled 2019-08-15 (×9): qty 1000

## 2019-08-15 MED ORDER — FUROSEMIDE 10 MG/ML IJ SOLN
20.0000 mg | Freq: Once | INTRAMUSCULAR | Status: AC
  Administered 2019-08-15: 20 mg via INTRAVENOUS
  Filled 2019-08-15: qty 2

## 2019-08-15 MED ORDER — PRO-STAT SUGAR FREE PO LIQD
60.0000 mL | Freq: Two times a day (BID) | ORAL | Status: DC
  Administered 2019-08-15 – 2019-09-09 (×50): 60 mL
  Filled 2019-08-15 (×52): qty 60

## 2019-08-15 NOTE — Progress Notes (Signed)
Patient ID: [name], male   DOB: 1976-05-29, 44 y.o.   MRN: [id number]    Advanced Heart Failure Rounding Note   Subjective:    Remains on VV ecmo with good flows. Remains sedated and paralyzed.  Received lasix 60m IV bid yesterday with excellent urine output  Weight down 9 pounds. Got 1 bottle albumin. BP transiently down and was briefly on VP. Now off  Copious dried blood suctioned from trach overnight. Mild oozing now.   Bival switched back to heparin.   Co-ox 84%  ABG 7.44/37/62/93%  CXR: Bilateral lung opacities c/w ARDS perhaps again slightly better in upper zones No major change Personally reviewed   On vent 50% PEEP 13. Driving pressure 12 No change. TV in 100    ECMO parameters Flow 5.3 RPM 3850 dP 35 Pressure -105   Objective:   Weight Range:  Vital Signs:   Temp:  [98.1 F (36.7 C)-98.7 F (37.1 C)] 98.7 F (37.1 C) (01/07 0800) Pulse Rate:  [54-69] 60 (01/07 0700) Resp:  [10-64] 46 (01/07 0700) BP: (131-158)/(48-63) 158/63 (01/07 0308) SpO2:  [89 %-99 %] 93 % (01/07 0811) Arterial Line BP: (106-151)/(34-62) 143/54 (01/07 0700) FiO2 (%):  [40 %-50 %] 40 % (01/07 0811) Weight:  [129.9 kg] 129.9 kg (01/07 0500) Last BM Date: 08/14/19  Weight change: Filed Weights   08/13/19 0439 08/14/19 0500 08/15/19 0500  Weight: 134.2 kg 134.2 kg 129.9 kg    Intake/Output:   Intake/Output Summary (Last 24 hours) at 08/15/2019 0916 Last data filed at 08/15/2019 0700 Gross per 24 hour  Intake 5344.24 ml  Output 5765 ml  Net -420.76 ml     Physical Exam: General:  Sedated on vent HEENT: normal + nasal packing Neck: supple. Hard to see JVP. Carotids 2+ bilat; no bruits. No lymphadenopathy or thryomegaly appreciated. Cor: PMI nondisplaced. Regular rate & rhythm. No rubs, gallops or murmurs. Lungs: minimal air movement Abdomen: obese soft, nontender, nondistended. No hepatosplenomegaly. No bruits or masses. Good bowel sounds. Extremities: no cyanosis,  clubbing, rash, 1-2+ edema Cannulas ok  Neuro: sedated on vent    Telemetry: Sinus 60-70s Personally reviewed  Labs: Basic Metabolic Panel: Recent Labs  Lab 08/10/19 0412 08/10/19 0615 08/13/19 0427 08/13/19 1639 08/14/19 0438 08/14/19 1520 08/14/19 1602 08/14/19 2122 08/15/19 0433 08/15/19 0446  NA 140  --  140 141 141 144 145 146* 146* 146*  K 4.7  --  4.3 4.5 4.2 4.4 4.4 4.0 3.8 3.9  CL 108   < > 108 108 108 111  --   --  113*  --   CO2 26   < > _0 --   --  24  --   GLUCOSE 161*   < > 171* 172* 156* 176*  --   --  126*  --   BUN 35*   < > 29* 30* 26* 27*  --   --  28*  --   CREATININE 0.49*   < > 0.53* 0.50* 0.43* 0.48*  --   --  0.52*  --   CALCIUM 8.3*   < > 8.7* 8.6* 8.8* 9.0  --   --  8.6*  --   MG 2.1  --   --   --   --   --   --   --   --   --   PHOS 3.4  --   --   --   --   --   --   --   --   --    < > =  values in this interval not displayed.    Liver Function Tests: Recent Labs  Lab 08/11/19 0509 08/12/19 0420 08/13/19 0427 08/14/19 0438 08/15/19 0433  AST _0 ALT 44 38 33 33 29  ALKPHOS 37* 37* 43 46 46  BILITOT 0.7 0.8 0.6 0.9 0.9  PROT 4.3* 5.1* 5.1* 5.1* 5.1*  ALBUMIN 2.9* 3.7 3.4* 3.1* 2.9*   No results for input(s): LIPASE, AMYLASE in the last 168 hours. No results for input(s): AMMONIA in the last 168 hours.  CBC: Recent Labs  Lab 08/13/19 0427 08/13/19 1639 08/14/19 0438 08/14/19 1520 08/14/19 1602 08/14/19 2122 08/15/19 0433 08/15/19 0446  WBC 8.5 8.8 8.4 7.6  --   --  7.8  --   HGB 8.9* 8.8* 8.9* 9.3* 9.9* 9.2* 9.1* 8.8*  HCT 27.7* 28.0* 28.5* 29.5* 29.0* 27.0* 29.5* 26.0*  MCV 95.2 97.6 97.3 96.7  --   --  98.7  --   PLT 48* 58* 53* 53*  --   --  54*  --     Cardiac Enzymes: No results for input(s): CKTOTAL, CKMB, CKMBINDEX, TROPONINI in the last 168 hours.  BNP: BNP (last 3 results) Recent Labs    07/22/19 1039  BNP 15.3    ProBNP (last 3 results) No results for input(s): PROBNP in the last  8760 hours.    Other results:  Imaging: DG CHEST PORT 1 VIEW  Result Date: 08/15/2019 CLINICAL DATA:  ARDS, COVID positive 30 days prior, history of ECMO EXAM: PORTABLE CHEST 1 VIEW COMPARISON:  Radiograph 08/14/2019 FINDINGS: *Tracheostomy tube in the mid trachea. *Transesophageal tube tip below the GE junction, beyond the level of imaging. *Right upper extremity PICC terminates at the superior cavoatrial junction. *ECMO cannula in stable position. *Bilateral chest tubes in stable position. Persistent dense airspace opacity throughout the right lung with developing air bronchograms. Much of the left hemithorax is opacified as well with some residual aerated lung in the apex. No clear pneumothorax. Cardiomediastinal contours are obscured. No acute osseous or soft tissue abnormality. IMPRESSION: Persistent opacification of the entire right hemithorax with developing air bronchograms. Opacification the left hemithorax is similar to prior. Stable appearance of support devices. Electronically Signed   By: Lovena Le M.D.   On: 08/15/2019 06:33   DG CHEST PORT 1 VIEW  Result Date: 08/14/2019 CLINICAL DATA:  COVID-19 positive, recent ECMO, persistent cardiorespiratory problems EXAM: PORTABLE CHEST 1 VIEW COMPARISON:  Radiograph 08/13/2019 FINDINGS: *Tracheostomy tube terminates in the mid trachea. *Transesophageal feeding tube terminates below the level of imaging. *Bilateral chest tubes are in stable position. *Right upper extremity PICC terminates at the expected location of the superior cavoatrial junction. *ECMO cannula projects over the low right chest Diminishing lung volumes with now complete opacification of the right hemithorax and only minimal visible aerated lung in the left apex. Cardiac silhouette and diaphragm are obscured. No acute osseous or soft tissue abnormality is seen. IMPRESSION: 1. Now complete opacification of the right hemithorax and only minimal visible aerated lung in the left apex.  Likely reflecting either diminishing lung volumes or worsening airspace disease or effusion. 2. Stable position of support devices. Electronically Signed   By: Lovena Le M.D.   On: 08/14/2019 06:21     Medications:     Scheduled Medications: . artificial tears  1 application Both Eyes I9S  . vitamin C  500 mg Per Tube Daily  . chlorhexidine gluconate (MEDLINE KIT)  15 mL Mouth Rinse BID  .  Chlorhexidine Gluconate Cloth  6 each Topical Daily  . dexamethasone (DECADRON) injection  1 mg Intravenous Q24H  . diazepam  5 mg Oral Q6H  . feeding supplement (PRO-STAT SUGAR FREE 64)  30 mL Per Tube BID  . free water  250 mL Per Tube Q8H  . gabapentin  300 mg Per Tube Q8H  . insulin aspart  3-9 Units Subcutaneous Q4H  . magnesium hydroxide  15 mL Oral Daily  . mouth rinse  15 mL Mouth Rinse 10 times per day  . morphine CONCENTRATE  10 mg Oral Q4H  . oxymetazoline  1 spray Each Nare BID  . pantoprazole (PROTONIX) IV  40 mg Intravenous QHS  . QUEtiapine  50 mg Per Tube BID  . senna-docusate  1 tablet Per Tube BID  . sodium chloride flush  10-40 mL Intracatheter Q12H  . sodium chloride flush  10-40 mL Intracatheter Q12H  . Thrombi-Pad  1 each Topical Once  . zinc sulfate  220 mg Per Tube Daily    Infusions: . sodium chloride    . sodium chloride 10 mL/hr at 08/15/19 0700  . albumin human Stopped (08/14/19 2056)  . ceFEPime (MAXIPIME) IV 2 g (08/15/19 0849)  . cisatracurium (NIMBEX) infusion 3 mcg/kg/min (08/15/19 0700)  . dextrose    . feeding supplement (PIVOT 1.5 CAL) 1,000 mL (08/14/19 2213)  . heparin 1,600 Units/hr (08/15/19 0847)  . HYDROmorphone 4 mg/hr (08/15/19 0700)  . midazolam (VERSED) infusion 24 mg/hr (08/15/19 0700)  . propofol (DIPRIVAN) infusion 25 mcg/kg/min (08/15/19 0700)  . vancomycin Stopped (08/15/19 0243)  . vasopressin (PITRESSIN) infusion - *FOR SHOCK* Stopped (08/15/19 0220)    PRN Medications: Place/Maintain arterial line **AND** sodium chloride,  sodium chloride, acetaminophen, albumin human, dextrose, hydrALAZINE, HYDROmorphone, labetalol, metoprolol tartrate, midazolam, ondansetron (ZOFRAN) IV, sodium chloride, sodium chloride flush, sodium chloride flush, sodium chloride flush   Assessment/Plan:    1. Acute hypoxic/hypercarbic respiratory failure due to COVID PNA: Remained markedly hypoxic despite full support. Intubated 12/26. S/p bilateral CTs 12/26 for pneumothoraces.  TEE on 12/26 LVEF 70% RV ok. VV ECMO begun 12/26. Day #9 VV ECMO. Tracheostomy 12/29.  COVID ARDS at this point.  Flows stable and sats improved with deeper sedation and re-paralysis  - ECMO parameters stable but requiring sedation and paralytics - Volume elevated but markedly improved with diuresis yesterday. Will give additional 20 IV lasix today - Given bleeding from trach discussed possible repeat bronch with CCM today - Would hold off on CT today as lung u/s yesterday did not reveal significant effusions and I doubt CT will change management much at this point - Sats running mid 90s (improved with paralytics) Lungs not participating much in gas exchange at this point. TVs 70 -> 100. Driving pressure 09-47 range. Wean PEEP as able - Continue lung rest.  -  ECMO parameters stable.  - PLTs 54k this am. LDH 383 - Switch nasal packing today   2. Bilateral PTX: Due to barotrauma.  - Driving pressure minimized at 12-13. Stable this am. No change. Wean PEEP as toelrated  3. COVID PNA: management of COVID infection per CCM. CXR with bilateral multifocal PNA progressing to likely ARDS.  - s/p 10 days of remdesivir - 12/16, 12/2 s/p tociluzimab - 12/17 s/p convalescent plasma - On decadron at 17m/daily until 1/11 - On cefepime and vanc - see below. Reviewed with PharmD today. Continue course  4. ID: Empiric coverage with vancomycin/cefepime initially for possible secondary bacterial PNA, course completed.   -  now back on cefepime and vanc for + cx with MSSE and  serratia (sensitivities pending). Discussed dosing with PharmD personally. - Remainss table   5. Thrombocytopenia: Slow decrease in platelets, likely low level hemolysis + critical illness.  - switched to bival on 1/4. HIT negative - 54k ths am.  - Bival off. Back on heparin. Dosing discussed with PharmD  6. Anemia: Bleeding at trach site resolved but still bleeding at right chest tube site and CVL. - hgb stable at 9.1 - Transfuse hgb < 8   7. FEN - continue TFs  D/w with ECMO team and CCM at bedside on multidisciplinary rounds. Continue to await ARDS recovery. Details as above   Length of Stay: 24   CRITICAL CARE Performed by: Glori Bickers  Total critical care time: 40 minutes  Critical care time was exclusive of separately billable procedures and treating other patients.  Critical care was necessary to treat or prevent imminent or life-threatening deterioration.  Critical care was time spent personally by me on the following activities: development of treatment plan with patient and/or surrogate as well as nursing, discussions with consultants, evaluation of patient's response to treatment, examination of patient, obtaining history from patient or surrogate, ordering and performing treatments and interventions, ordering and review of laboratory studies, ordering and review of radiographic studies, pulse oximetry and re-evaluation of patient's condition.    Glori Bickers MD 08/15/2019, 9:16 AM  Advanced Heart Failure Team Pager [phone] (M-F; 7a - 4p)  Please contact [facility] for night-coverage after hours (4p -7a ) and weekends on [email or web address]

## 2019-08-15 NOTE — Progress Notes (Addendum)
NAME:  [name], MRN:  [id number], DOB:  Dec 23, 1975, LOS: 24 ADMISSION DATE:  07/22/2019, CONSULTATION DATE:  12/24 REFERRING MD:  Sloan Leiter, CHIEF COMPLAINT:  Dyspnea   Brief History   44 y/o M admitted 12/14 with COVID pneumonia causing acute hypoxemic respiratory failure. Developed pneumomediastinum 12/23 with concern for possible bilateral small pneumothoraces on 12/24.  PCCM consulted for evaluation of pneumothoraces  Past Medical History  GERD HTN Asthma  Significant Hospital Events   12/14 admit with hypoxemic respiratory failure in setting of COVID-19 pneumonia 12/23 noted to have pneumomediastinum on chest x-ray 12/24 PCCM consulted for evaluation 12/26 worsening hypoxemia, intubated  12/27 VV fem/fem ECMO cannulation 12/28 ETT switch 12/29 Tracheostomy 12/31 Oozing from multiple sites, H/H drop. Heparin held briefly and lidocaine with epi injected around trach site, Cryo, DDVAP given 1/3 CVL being discontinued  1/5: repacked nares  Consults:  PCCM  Procedures:  1/4 bronch: bal performed  Significant Diagnostic Tests:  CT chest 12/22 >> extensive pneumomediastinum, multifocal patchy bilateral groundglass opacities  Micro Data:  BCx2 12/14 >> negative  Sputum 12/15 >> negative  1/4 bronch:  1/2 acid fast smear bronch: negative 1/2 fungal cx bronch: pending  Antimicrobials:  Received remdesivir, steroids, actemra and convalescent plasma  5 days cefepime 12/24-12/29 Cefepime 1/4-> vanc 1/4->   Interim history/subjective:  1/7: vent changes yesterday, some increased secretions overnight with some bloody clots removed. May replace nasal packing today. Cont diuresis today and ct chest tomorrow. Weaning paralytic and sedation with plans to increase PO benzo/narcotic to decrease IV 1/6:Nasal packing yesterday late afternoon. Sweep increased overnight. Remains chemically paralyzed which has improved chatter.  Heart Failure plans to diurese today.  Objective    Blood pressure (!) 158/63, pulse (!) 58, temperature 98.7 F (37.1 C), temperature source Axillary, resp. rate (!) 28, height _0  (1.727 m), weight 129.9 kg, SpO2 98 %.    Vent Mode: PRVC FiO2 (%):  [40 %-50 %] 40 % Set Rate:  [15 bmp] 15 bmp Vt Set:  [100 mL] 100 mL PEEP:  [12 cmH20-15 cmH20] 12 cmH20 Plateau Pressure:  [23 cmH20-36 cmH20] 23 cmH20   Intake/Output Summary (Last 24 hours) at 08/15/2019 0926 Last data filed at 08/15/2019 0900 Gross per 24 hour  Intake 5575.06 ml  Output 6090 ml  Net -514.94 ml   Filed Weights   08/13/19 0439 08/14/19 0500 08/15/19 0500  Weight: 134.2 kg 134.2 kg 129.9 kg   Examination GEN: Critically ill appearing middle aged male. Sedated and chemically paralyzed, trach/vent, on VV ECMO HEENT: [place]. Nasal packing with serosang drainage noted Trach intact without bleeding. Pink mmm.  CV: RRR. 2+ radial pulses. No rgm.  PULM: Minimal chest expansion with very low tidal volumes. Expansion is symmetrical. Breath sounds are very diminished. Chest tubes with serosanguinous output.  R with small airleak GI: Obese, soft, round. Ndnt. Normoactive x4  EXT: Symmetrical bulk and tone without cyanosis or clubbing. Bilateral femoral cannulas, insertion sites are clean and dry without bleeding.  NEURO: Sedated and chemically paralyzed. PERR.  SKIN: Pale, cool, clean, dry. Few small, round, pustular eruptions across chest.   Resolved Hospital Problem list    Assessment & Plan:   Shock, improved -off vasopressors   Acute hypoxemic respiratory failure with ARDS secondary to COVID-19, s/p tracheostomy - s/p remdesivir, toci, convalescent plasma - VV ECMO initiated 12/26 -Trached 12/29 P -Continue VV. Appreciate heart failure and CVTS involvement.  -sweep 10 and Flow 5.5 -maintain vent settings as they are.  -  CTchest tomorrow after further diuresis -continues on decadron until 1/11   Staph epi and serratia marcescens pneumonia:  -cefepime and  vanc  Bilateral pneumothoraces and pneumomediastinum P -R with some leak (likely where loss of tv is) -back to -20cm h20  Coagulopathy, thrombocytopenia - negative HIT  P - transitioned back to heparin gtt  Nose bleeds - repacked 1/5 after discussion with ENT P - cefepime/vanc (on for above pna) while packing in place for TSS prevention (ceftriaxone otherwise)  Hyperglycemia -SSI coverage  Pustular Rash -few amount of small round pustular eruptions on chest.  -suspect dermatitis but will continue to monitor for worsening  Best practice:  Feeding: EN  Analgesia: see above Sedation: see above Thromboprophylaxis: heparin gtt Ulcer prophylaxis: pantoprazole Glucose control: SSI Family: attempted to update wife and mother via phone without answer. Left message. Will try again later.   Labs   CBC: Recent Labs  Lab 08/13/19 0427 08/13/19 1639 08/14/19 0438 08/14/19 1520 08/14/19 1602 08/14/19 2122 08/15/19 0433 08/15/19 0446  WBC 8.5 8.8 8.4 7.6  --   --  7.8  --   HGB 8.9* 8.8* 8.9* 9.3* 9.9* 9.2* 9.1* 8.8*  HCT 27.7* 28.0* 28.5* 29.5* 29.0* 27.0* 29.5* 26.0*  MCV 95.2 97.6 97.3 96.7  --   --  98.7  --   PLT 48* 58* 53* 53*  --   --  54*  --     Basic Metabolic Panel: Recent Labs  Lab 08/10/19 0412 08/10/19 0615 08/13/19 0427 08/13/19 1639 08/14/19 0438 08/14/19 1520 08/14/19 1602 08/14/19 2122 08/15/19 0433 08/15/19 0446  NA 140  --  140 141 141 144 145 146* 146* 146*  K 4.7  --  4.3 4.5 4.2 4.4 4.4 4.0 3.8 3.9  CL 108   < > 108 108 108 111  --   --  113*  --   CO2 26   < > _0 --   --  24  --   GLUCOSE 161*   < > 171* 172* 156* 176*  --   --  126*  --   BUN 35*   < > 29* 30* 26* 27*  --   --  28*  --   CREATININE 0.49*   < > 0.53* 0.50* 0.43* 0.48*  --   --  0.52*  --   CALCIUM 8.3*   < > 8.7* 8.6* 8.8* 9.0  --   --  8.6*  --   MG 2.1  --   --   --   --   --   --   --   --   --   PHOS 3.4  --   --   --   --   --   --   --   --   --    <  > = values in this interval not displayed.   GFR: Estimated Creatinine Clearance: 153.4 mL/min (A) (by C-G formula based on SCr of 0.52 mg/dL (L)). Recent Labs  Lab 08/12/19 0420 08/13/19 0427 08/13/19 1639 08/14/19 0438 08/14/19 1520 08/15/19 0433  WBC 10.1 8.5 8.8 8.4 7.6 7.8  LATICACIDVEN 1.9 1.9  --  1.5  --  1.2    Liver Function Tests: Recent Labs  Lab 08/11/19 0509 08/12/19 0420 08/13/19 0427 08/14/19 0438 08/15/19 0433  AST _1 ALT 44 38 33 33 29  ALKPHOS 37* 37* 43 46 46  BILITOT 0.7 0.8 0.6 0.9  0.9  PROT 4.3* 5.1* 5.1* 5.1* 5.1*  ALBUMIN 2.9* 3.7 3.4* 3.1* 2.9*   No results for input(s): LIPASE, AMYLASE in the last 168 hours. No results for input(s): AMMONIA in the last 168 hours.  ABG    Component Value Date/Time   PHART 7.446 08/15/2019 0446   PCO2ART 36.8 08/15/2019 0446   PO2ART 62.0 (L) 08/15/2019 0446   HCO3 25.4 08/15/2019 0446   TCO2 27 08/15/2019 0446   ACIDBASEDEF 3.0 (H) 08/13/2019 2056   O2SAT 83.9 08/15/2019 0451     Coagulation Profile: Recent Labs  Lab 08/11/19 2113 08/12/19 0420 08/13/19 0427 08/14/19 0438 08/15/19 0433  INR 1.2 1.2 1.5* 1.4* 1.1    Cardiac Enzymes: No results for input(s): CKTOTAL, CKMB, CKMBINDEX, TROPONINI in the last 168 hours.  HbA1C: Hgb A1c MFr Bld  Date/Time Value Ref Range Status  07/24/2019 04:50 AM 6.7 (H) 4.8 - 5.6 % Final    Comment:    (NOTE) Pre diabetes:          5.7%-6.4% Diabetes:              >6.4% Glycemic control for   <7.0% adults with diabetes   09/30/2017 05:36 AM 5.7 (H) 4.8 - 5.6 % Final    Comment:    (NOTE)         Prediabetes: 5.7 - 6.4         Diabetes: >6.4         Glycemic control for adults with diabetes: <7.0     CBG: Recent Labs  Lab 08/14/19 1531 08/14/19 1943 08/14/19 2343 08/15/19 0444 08/15/19 0757  GLUCAP 162* 138* 105* 120* 117*     Critical care time:     Critical care time: The patient is critically ill with multiple organ  systems failure and requires high complexity decision making for assessment and support, frequent evaluation and titration of therapies, application of advanced monitoring technologies and extensive interpretation of multiple databases.  Critical care time 43 mins. This represents my time independent of the NPs time taking care of the pt. This is excluding procedures.    [facility] 08/15/2019, 9:27 AM

## 2019-08-15 NOTE — Progress Notes (Signed)
Updated mother via phone

## 2019-08-15 NOTE — Progress Notes (Addendum)
Nutrition Follow up  DOCUMENTATION CODES:   Obesity unspecified  INTERVENTION:   Increase tube feeding:  -Pivot 1.5 @ 70 ml/hr via Cortrak (1680 ml)  -Increase 60 ml Prostat BID -Free water flushes 250 ml Q8 hours-per CCM  At goal rate TF provides: 2920 kcals (3403 ml with propofol), 218 grams protein, 1260 ml free water (2010 ml with flushes). Meets 100% of needs.   NUTRITION DIAGNOSIS:   Increased nutrient needs related to acute illness(COVID PNA) as evidenced by estimated needs.  Ongoing  GOAL:   Patient will meet greater than or equal to 90% of their needs   Addressed via TF  MONITOR:   Diet advancement, Skin, TF tolerance, Weight trends, Labs, I & O's  REASON FOR ASSESSMENT:   Consult Enteral/tube feeding initiation and management  ASSESSMENT:   Patient with PMH significant for GERD, HTN, and asthma. Presents this admission with COVID-19 PNA.   12/14- admit 12/23- chest xray revealed pneumomediastinum  12/24- developed bilateral pneumothoraces  12/26- worsening hypoxemia, intubated  12/27- VV fem/fem ECMO cannulation  12/29- trach  1/5- nasal bleeding- packing per CCM  Remains on VV ECMO. Continues on paralytic. Bloody clots removed from trach. Nasal packing replaced by ENT. Down 4 kg from yesterday with lasix. Tolerating TF at goal rate. Increase TF to better meet needs now that propofol is being weaned.   Admission weight: 136.1 kg  Current weight: 129.9 kg   Patient requiring ventilator support via trach  MV: 1.5 L/min Temp (24hrs), Avg:98.5 F (36.9 C), Min:98.2 F (36.8 C), Max:98.7 F (37.1 C)  Propofol: 18.3 ml/hr- provides 483 kcal from lipids daily   I/O: +16,444 ml since 12/24 UOP: 7,260 ml x 24 hrs  Chest tubes: 300 ml x 24 hrs  Drips: nimbex, versed, propofol, heparin   Medications: 500 mg Vit C, SS novolog, senokot, 220 mg zinc sulfate Labs: CBG 126-233   Diet Order:   Diet Order            Diet NPO time specified  Diet  effective midnight              EDUCATION NEEDS:   Not appropriate for education at this time  Skin:  Skin Assessment: Reviewed RN Assessment  Last BM:  1/6  Height:   Ht Readings from Last 1 Encounters:  08/09/19 5' 8"  (1.727 m)    Weight:   Wt Readings from Last 1 Encounters:  08/15/19 129.9 kg    Ideal Body Weight:  70 kg  BMI:  Body mass index is 43.54 kg/m.  Estimated Nutritional Needs:   Kcal:  2671-2458 kcal (25-30 kcal/kg)  Protein:  180-215 grams  Fluid:  >/= 2.5 L/day   [name] RD, LDN Clinical Nutrition Pager # - [phone]

## 2019-08-15 NOTE — Progress Notes (Addendum)
ANTICOAGULATION CONSULT NOTE  Pharmacy Consult for heparin Indication: ECMO  No Known Allergies  Patient Measurements: Height: 5' 8"  (172.7 cm) Weight: 286 lb 6 oz (129.9 kg) IBW/kg (Calculated) : 68.4 Heparin Dosing Weight: 100.7 kg  Vital Signs: Temp: 98.2 F (36.8 C) (01/07 0430) Temp Source: Oral (01/07 0430) BP: 158/63 (01/07 0308) Pulse Rate: 60 (01/07 0600)  Labs: Recent Labs    08/12/19 1327 08/13/19 0427 08/13/19 0429 08/14/19 0438 08/14/19 1520 08/14/19 2113 08/14/19 2122 08/15/19 0433 08/15/19 0446  HGB  --  8.9*  --  8.9* 9.3*  --  9.2* 9.1* 8.8*  HCT  --  27.7*  --  28.5* 29.5*  --  27.0* 29.5* 26.0*  PLT  --  48*   < > 53* 53*  --   --  54*  --   APTT  --  52*   < > 56*  --  73*  --  66*  --   LABPROT  --  17.9*  --  16.9*  --   --   --  14.3  --   INR  --  1.5*  --  1.4*  --   --   --  1.1  --   HEPARINUNFRC <0.10*  --   --   --   --  0.23*  --  0.25*  --   CREATININE  --  0.53*   < > 0.43* 0.48*  --   --  0.52*  --    < > = values in this interval not displayed.    Estimated Creatinine Clearance: 153.4 mL/min (A) (by C-G formula based on SCr of 0.52 mg/dL (L)).  Assessment: 44 yo male known COVID+ and ARDS VV ECMO. Heparin was restarted today (bivalirudin stopped as HIT test was negative).  Heparin level was slightly subtherapeutic at 0.25, aPTT therapeutic at low end of goal at 66. Hgb 8.8, plt 54. LDH 383. Fibrinogen 384. Had bleeding last evening - now stabilized/improved.    Goal of Therapy:  Heparin level: 0.3-0.5 APTT goal: 66-85 sec Monitor platelets by anticoagulation protocol: Yes   Plan:  -Increase heparin infusion to 1600 units/hr -Order heparin level and aPTT in 6 hours -Monitor s/sx of bleeding  Antonietta Jewel, PharmD, [facility] Pharmacist  Phone: [phone]  Please check AMION for all [place] phone numbers After 10:00 PM, call [facility] [phone]  ADDENDUM Heparin level is therapeutic at 0.31, on 1600 units/hr.  Nasal packing were changed today - some bleeding noted. No other s/sx of bleeding. Continue at same rate of 1600 units/hr and get level in 6 hours. If remains therapeutic, will check in 12 hours.   Antonietta Jewel, PharmD, [facility] Clinical Pharmacist

## 2019-08-15 NOTE — Progress Notes (Signed)
Patient's sats dropped to 84%, ECMO Venous pressure increased and alarmed, and ECMO cannulas started chattering.  CCM called to bedside, ECMO Coordinator at bedside. RPM's dropped to stop chattering, RN re-started Nimbex, RPM's gradually brought back up to 3850.  Sats improved to 93%.

## 2019-08-15 NOTE — Consult Note (Signed)
Reason for Consult: Epistaxis Referring Physician: CCM  [name] is an 44 y.o. male.  HPI: 44 year old male admitted for cardiac and respiratory failure following COVID pneumonia who has been on ECMO since 12/26 and has been having problems with nosebleeds since then.  His nose has been packed a couple of times by CCM with benefit.  The last set of packs stayed in two days but came out today and bleeding resumed.  He remains heparinized on ECMO.  Past Medical History:  Diagnosis Date  . Asthma   . GERD (gastroesophageal reflux disease)   . Headache   . History of kidney stones    LEFT URETERAL STONE  . HTN (hypertension)   . Pancreatitis 2018   GALLBALDDER SLUDGE CAUSED ISSUED RESOLVED    Past Surgical History:  Procedure Laterality Date  . CYSTOSCOPY/URETEROSCOPY/HOLMIUM LASER/STENT PLACEMENT Left 04/18/2019   Procedure: LEFT URETEROSCOPY/HOLMIUM LASER/STENT PLACEMENT;  Surgeon: Ardis Hughs, MD;  Location: [facility];  Service: Urology;  Laterality: Left;  . CYSTOSCOPY/URETEROSCOPY/HOLMIUM LASER/STENT PLACEMENT Left 05/02/2019   Procedure: CYSTOSCOPY/URETEROSCOPY/HOLMIUM LASER/STENT EXCHANGE;  Surgeon: Ardis Hughs, MD;  Location: [facility];  Service: Urology;  Laterality: Left;  . ECMO CANNULATION N/A 08/03/2019   Procedure: ECMO CANNULATION;  Surgeon: Wonda Olds, MD;  Location: [facility];  Service: Cardiovascular;  Laterality: N/A;  . IRRIGATION AND DEBRIDEMENT SHOULDER Right 09/29/2017   Procedure: IRRIGATION AND DEBRIDEMENT SHOULDER;  Surgeon: Leandrew Koyanagi, MD;  Location: [facility];  Service: Orthopedics;  Laterality: Right;  . LUMBAR DISC SURGERY  2002    Family History  Problem Relation Age of Onset  . Asthma Brother   . Diabetes Father   . Hypertension Father   . Diabetes Paternal Grandmother   . Hypertension Paternal Grandmother   . Migraines Mother   . GER disease Mother   . Pancreatic cancer Maternal Grandmother   . Heart  disease Maternal Grandfather     Social History:  reports that he has never smoked. He has never used smokeless tobacco. He reports current alcohol use. He reports that he does not use drugs.  Allergies: No Known Allergies  Medications: I have reviewed the patient's current medications.  Results for orders placed or performed during the hospital encounter of 07/22/19 (from the past 48 hour(s))  Glucose, capillary     Status: Abnormal   Collection Time: 08/13/19  3:44 PM  Result Value Ref Range   Glucose-Capillary 153 (H) 70 - 99 mg/dL  CBC     Status: Abnormal   Collection Time: 08/13/19  4:39 PM  Result Value Ref Range   WBC 8.8 4.0 - 10.5 K/uL   RBC 2.87 (L) 4.22 - 5.81 MIL/uL   Hemoglobin 8.8 (L) 13.0 - 17.0 g/dL   HCT 28.0 (L) 39.0 - 52.0 %   MCV 97.6 80.0 - 100.0 fL   MCH 30.7 26.0 - 34.0 pg   MCHC 31.4 30.0 - 36.0 g/dL   RDW 18.9 (H) 11.5 - 15.5 %   Platelets 58 (L) 150 - 400 K/uL    Comment: REPEATED TO VERIFY Immature Platelet Fraction may be clinically indicated, consider ordering this additional test [id number] CONSISTENT WITH PREVIOUS RESULT    nRBC 2.9 (H) 0.0 - 0.2 %    Comment: Performed at [facility], [address], [place], [place] [address]  Basic metabolic panel     Status: Abnormal   Collection Time: 08/13/19  4:39 PM  Result Value  Ref Range   Sodium 141 135 - 145 mmol/L   Potassium 4.5 3.5 - 5.1 mmol/L   Chloride 108 98 - 111 mmol/L   CO2 24 22 - 32 mmol/L   Glucose, Bld 172 (H) 70 - 99 mg/dL   BUN 30 (H) 6 - 20 mg/dL   Creatinine, Ser 0.50 (L) 0.61 - 1.24 mg/dL   Calcium 8.6 (L) 8.9 - 10.3 mg/dL   GFR calc non Af Amer >60 >60 mL/min   GFR calc Af Amer >60 >60 mL/min   Anion gap 9 5 - 15    Comment: Performed at [facility] [name], [place], [place] [address]  APTT     Status: Abnormal   Collection Time: 08/13/19  4:39 PM  Result Value Ref Range   aPTT 56 (H) 24 - 36 seconds    Comment:        IF BASELINE aPTT IS  ELEVATED, SUGGEST PATIENT RISK ASSESSMENT BE USED TO DETERMINE APPROPRIATE ANTICOAGULANT THERAPY. Performed at [facility], [place] [address], [place], [place] [address]   I-STAT 7, (LYTES, BLD GAS, ICA, H+H)     Status: Abnormal   Collection Time: 08/13/19  5:50 PM  Result Value Ref Range   pH, Arterial 7.363 7.350 - 7.450   pCO2 arterial 46.2 32.0 - 48.0 mmHg   pO2, Arterial 57.0 (L) 83.0 - 108.0 mmHg   Bicarbonate 26.3 20.0 - 28.0 mmol/L   TCO2 28 22 - 32 mmol/L   O2 Saturation 88.0 %   Acid-Base Excess 1.0 0.0 - 2.0 mmol/L   Sodium 141 135 - 145 mmol/L   Potassium 4.5 3.5 - 5.1 mmol/L   Calcium, Ion 1.30 1.15 - 1.40 mmol/L   HCT 29.0 (L) 39.0 - 52.0 %   Hemoglobin 9.9 (L) 13.0 - 17.0 g/dL   Patient temperature 98.4 F    Collection site RADIAL, ALLEN'S TEST ACCEPTABLE    Sample type CARDIOPULMONARY BYPASS   I-STAT 7, (LYTES, BLD GAS, ICA, H+H)     Status: Abnormal   Collection Time: 08/13/19  6:47 PM  Result Value Ref Range   pH, Arterial 7.376 7.350 - 7.450   pCO2 arterial 42.3 32.0 - 48.0 mmHg   pO2, Arterial 59.0 (L) 83.0 - 108.0 mmHg   Bicarbonate 24.9 20.0 - 28.0 mmol/L   TCO2 26 22 - 32 mmol/L   O2 Saturation 90.0 %   Sodium 141 135 - 145 mmol/L   Potassium 4.5 3.5 - 5.1 mmol/L   Calcium, Ion 1.29 1.15 - 1.40 mmol/L   HCT 28.0 (L) 39.0 - 52.0 %   Hemoglobin 9.5 (L) 13.0 - 17.0 g/dL   Patient temperature 98.2 F    Collection site RADIAL, ALLEN'S TEST ACCEPTABLE    Sample type CARDIOPULMONARY BYPASS   Glucose, capillary     Status: Abnormal   Collection Time: 08/13/19  8:55 PM  Result Value Ref Range   Glucose-Capillary 127 (H) 70 - 99 mg/dL  I-STAT 7, (LYTES, BLD GAS, ICA, H+H)     Status: Abnormal   Collection Time: 08/13/19  8:56 PM  Result Value Ref Range   pH, Arterial 7.365 7.350 - 7.450   pCO2 arterial 38.0 32.0 - 48.0 mmHg   pO2, Arterial 57.0 (L) 83.0 - 108.0 mmHg   Bicarbonate 21.8 20.0 - 28.0 mmol/L   TCO2 23 22 - 32 mmol/L   O2 Saturation  89.0 %   Acid-base deficit 3.0 (H) 0.0 - 2.0 mmol/L  Sodium 143 135 - 145 mmol/L   Potassium 3.9 3.5 - 5.1 mmol/L   Calcium, Ion 1.24 1.15 - 1.40 mmol/L   HCT 23.0 (L) 39.0 - 52.0 %   Hemoglobin 7.8 (L) 13.0 - 17.0 g/dL   Patient temperature 36.8 C    Sample type ARTERIAL   Glucose, capillary     Status: Abnormal   Collection Time: 08/14/19 12:09 AM  Result Value Ref Range   Glucose-Capillary 125 (H) 70 - 99 mg/dL  CBC     Status: Abnormal   Collection Time: 08/14/19  4:38 AM  Result Value Ref Range   WBC 8.4 4.0 - 10.5 K/uL   RBC 2.93 (L) 4.22 - 5.81 MIL/uL   Hemoglobin 8.9 (L) 13.0 - 17.0 g/dL   HCT 28.5 (L) 39.0 - 52.0 %   MCV 97.3 80.0 - 100.0 fL   MCH 30.4 26.0 - 34.0 pg   MCHC 31.2 30.0 - 36.0 g/dL   RDW 19.0 (H) 11.5 - 15.5 %   Platelets 53 (L) 150 - 400 K/uL    Comment: REPEATED TO VERIFY Immature Platelet Fraction may be clinically indicated, consider ordering this additional test [id number] CONSISTENT WITH PREVIOUS RESULT    nRBC 2.0 (H) 0.0 - 0.2 %    Comment: Performed at [facility], [address], [place], [place] [address]  Basic metabolic panel     Status: Abnormal   Collection Time: 08/14/19  4:38 AM  Result Value Ref Range   Sodium 141 135 - 145 mmol/L   Potassium 4.2 3.5 - 5.1 mmol/L   Chloride 108 98 - 111 mmol/L   CO2 23 22 - 32 mmol/L   Glucose, Bld 156 (H) 70 - 99 mg/dL   BUN 26 (H) 6 - 20 mg/dL   Creatinine, Ser 0.43 (L) 0.61 - 1.24 mg/dL   Calcium 8.8 (L) 8.9 - 10.3 mg/dL   GFR calc non Af Amer >60 >60 mL/min   GFR calc Af Amer >60 >60 mL/min   Anion gap 10 5 - 15    Comment: Performed at [facility], [place] [address], [place], [place] [address]  Lactate dehydrogenase     Status: Abnormal   Collection Time: 08/14/19  4:38 AM  Result Value Ref Range   LDH 396 (H) 98 - 192 U/L    Comment: Performed at [place] [address] [place], [place] [address]  Protime-INR     Status: Abnormal   Collection Time: 08/14/19   4:38 AM  Result Value Ref Range   Prothrombin Time 16.9 (H) 11.4 - 15.2 seconds   INR 1.4 (H) 0.8 - 1.2    Comment: (NOTE) INR goal varies based on device and disease states. Performed at [facility], [place] [address] , [place] [address]   Fibrinogen     Status: None   Collection Time: 08/14/19  4:38 AM  Result Value Ref Range   Fibrinogen 363 210 - 475 mg/dL    Comment: Performed at [facility] [address] [place], [place] [address]  Hepatic function panel     Status: Abnormal   Collection Time: 08/14/19  4:38 AM  Result Value Ref Range   Total Protein 5.1 (L) 6.5 - 8.1 g/dL   Albumin 3.1 (L) 3.5 - 5.0 g/dL   AST 21 15 - 41 U/L   ALT 33 0 - 44 U/L   Alkaline Phosphatase 46 38 - 126 U/L  Total Bilirubin 0.9 0.3 - 1.2 mg/dL   Bilirubin, Direct 0.2 0.0 - 0.2 mg/dL   Indirect Bilirubin 0.7 0.3 - 0.9 mg/dL    Comment: Performed at [facility], [place] [address] [place], [place] [address]  Lactic acid, plasma     Status: None   Collection Time: 08/14/19  4:38 AM  Result Value Ref Range   Lactic Acid, Venous 1.5 0.5 - 1.9 mmol/L    Comment: Performed at [facility] [address] [place], [place] [address]  Triglycerides     Status: Abnormal   Collection Time: 08/14/19  4:38 AM  Result Value Ref Range   Triglycerides 247 (H) <150 mg/dL    Comment: Performed at [facility] [address], [place], [place] [address]  APTT     Status: Abnormal   Collection Time: 08/14/19  4:38 AM  Result Value Ref Range   aPTT 56 (H) 24 - 36 seconds    Comment:        IF BASELINE aPTT IS ELEVATED, SUGGEST PATIENT RISK ASSESSMENT BE USED TO DETERMINE APPROPRIATE ANTICOAGULANT THERAPY. Performed at [facility], [place] [address], [place], [place] [address]   Glucose, capillary     Status: Abnormal   Collection Time: 08/14/19  4:38 AM  Result Value Ref Range   Glucose-Capillary 141 (H) 70 - 99 mg/dL  I-STAT 7, (LYTES, BLD GAS, ICA, H+H)      Status: Abnormal   Collection Time: 08/14/19  4:41 AM  Result Value Ref Range   pH, Arterial 7.434 7.350 - 7.450   pCO2 arterial 37.2 32.0 - 48.0 mmHg   pO2, Arterial 59.0 (L) 83.0 - 108.0 mmHg   Bicarbonate 24.8 20.0 - 28.0 mmol/L   TCO2 26 22 - 32 mmol/L   O2 Saturation 91.0 %   Acid-Base Excess 1.0 0.0 - 2.0 mmol/L   Sodium 142 135 - 145 mmol/L   Potassium 4.2 3.5 - 5.1 mmol/L   Calcium, Ion 1.27 1.15 - 1.40 mmol/L   HCT 26.0 (L) 39.0 - 52.0 %   Hemoglobin 8.8 (L) 13.0 - 17.0 g/dL   Patient temperature 98.9 F    Sample type ARTERIAL   .Cooxemetry Panel (carboxy, met, total hgb, O2 sat)     Status: Abnormal   Collection Time: 08/14/19  4:52 AM  Result Value Ref Range   Total hemoglobin 8.8 (L) 12.0 - 16.0 g/dL   O2 Saturation 75.6 %   Carboxyhemoglobin 3.8 (H) 0.5 - 1.5 %   Methemoglobin 2.3 (H) 0.0 - 1.5 %    Comment: Performed at [facility] [address], [place], [place] [address]  Glucose, capillary     Status: Abnormal   Collection Time: 08/14/19  8:31 AM  Result Value Ref Range   Glucose-Capillary 124 (H) 70 - 99 mg/dL  Glucose, capillary     Status: Abnormal   Collection Time: 08/14/19 11:34 AM  Result Value Ref Range   Glucose-Capillary 153 (H) 70 - 99 mg/dL  CBC     Status: Abnormal   Collection Time: 08/14/19  3:20 PM  Result Value Ref Range   WBC 7.6 4.0 - 10.5 K/uL   RBC 3.05 (L) 4.22 - 5.81 MIL/uL   Hemoglobin 9.3 (L) 13.0 - 17.0 g/dL   HCT 29.5 (L) 39.0 - 52.0 %   MCV 96.7 80.0 - 100.0 fL   MCH 30.5 26.0 - 34.0 pg   MCHC 31.5 30.0 - 36.0  g/dL   RDW 19.3 (H) 11.5 - 15.5 %   Platelets 53 (L) 150 - 400 K/uL    Comment: REPEATED TO VERIFY SPECIMEN CHECKED FOR CLOTS Immature Platelet Fraction may be clinically indicated, consider ordering this additional test [id number] CONSISTENT WITH PREVIOUS RESULT    nRBC 1.4 (H) 0.0 - 0.2 %    Comment: Performed at [facility] [address], [place], [place] [address]  Basic metabolic panel      Status: Abnormal   Collection Time: 08/14/19  3:20 PM  Result Value Ref Range   Sodium 144 135 - 145 mmol/L   Potassium 4.4 3.5 - 5.1 mmol/L   Chloride 111 98 - 111 mmol/L   CO2 25 22 - 32 mmol/L   Glucose, Bld 176 (H) 70 - 99 mg/dL   BUN 27 (H) 6 - 20 mg/dL   Creatinine, Ser 0.48 (L) 0.61 - 1.24 mg/dL   Calcium 9.0 8.9 - 10.3 mg/dL   GFR calc non Af Amer >60 >60 mL/min   GFR calc Af Amer >60 >60 mL/min   Anion gap 8 5 - 15    Comment: Performed at [facility], [place] [address] [place], [place] [address]  Vancomycin, trough     Status: None   Collection Time: 08/14/19  3:20 PM  Result Value Ref Range   Vancomycin Tr 18 15 - 20 ug/mL    Comment: Performed at [facility] [address] [place], [place] [address]  Glucose, capillary     Status: Abnormal   Collection Time: 08/14/19  3:31 PM  Result Value Ref Range   Glucose-Capillary 162 (H) 70 - 99 mg/dL   Comment 1 Notify RN   I-STAT 7, (LYTES, BLD GAS, ICA, H+H)     Status: Abnormal   Collection Time: 08/14/19  4:02 PM  Result Value Ref Range   pH, Arterial 7.438 7.350 - 7.450   pCO2 arterial 37.7 32.0 - 48.0 mmHg   pO2, Arterial 57.0 (L) 83.0 - 108.0 mmHg   Bicarbonate 25.5 20.0 - 28.0 mmol/L   TCO2 27 22 - 32 mmol/L   O2 Saturation 90.0 %   Acid-Base Excess 1.0 0.0 - 2.0 mmol/L   Sodium 145 135 - 145 mmol/L   Potassium 4.4 3.5 - 5.1 mmol/L   Calcium, Ion 1.30 1.15 - 1.40 mmol/L   HCT 29.0 (L) 39.0 - 52.0 %   Hemoglobin 9.9 (L) 13.0 - 17.0 g/dL   Patient temperature 98.3 F    Collection site ARTERIAL LINE    Drawn by Nurse    Sample type CARDIOPULMONARY BYPASS   Glucose, capillary     Status: Abnormal   Collection Time: 08/14/19  7:43 PM  Result Value Ref Range   Glucose-Capillary 138 (H) 70 - 99 mg/dL  APTT     Status: Abnormal   Collection Time: 08/14/19  9:13 PM  Result Value Ref Range   aPTT 73 (H) 24 - 36 seconds    Comment:        IF BASELINE aPTT IS ELEVATED, SUGGEST PATIENT RISK  ASSESSMENT BE USED TO DETERMINE APPROPRIATE ANTICOAGULANT THERAPY. Performed at [facility], [place] [address], [place], [place] [address]   Heparin level (unfractionated)     Status: Abnormal   Collection Time: 08/14/19  9:13 PM  Result Value Ref Range   Heparin Unfractionated 0.23 (L) 0.30 - 0.70 IU/mL    Comment: (NOTE) If heparin results are below expected values,  and patient dosage has  been confirmed, suggest follow up testing of antithrombin III levels. Performed at [facility], [place] [address], [name], [place] [address]   I-STAT 7, (LYTES, BLD GAS, ICA, H+H)     Status: Abnormal   Collection Time: 08/14/19  9:22 PM  Result Value Ref Range   pH, Arterial 7.425 7.350 - 7.450   pCO2 arterial 40.3 32.0 - 48.0 mmHg   pO2, Arterial 58.0 (L) 83.0 - 108.0 mmHg   Bicarbonate 26.5 20.0 - 28.0 mmol/L   TCO2 28 22 - 32 mmol/L   O2 Saturation 90.0 %   Acid-Base Excess 2.0 0.0 - 2.0 mmol/L   Sodium 146 (H) 135 - 145 mmol/L   Potassium 4.0 3.5 - 5.1 mmol/L   Calcium, Ion 1.29 1.15 - 1.40 mmol/L   HCT 27.0 (L) 39.0 - 52.0 %   Hemoglobin 9.2 (L) 13.0 - 17.0 g/dL   Patient temperature 98.2 F    Sample type ARTERIAL   Glucose, capillary     Status: Abnormal   Collection Time: 08/14/19 11:43 PM  Result Value Ref Range   Glucose-Capillary 105 (H) 70 - 99 mg/dL  CBC     Status: Abnormal   Collection Time: 08/15/19  4:33 AM  Result Value Ref Range   WBC 7.8 4.0 - 10.5 K/uL   RBC 2.99 (L) 4.22 - 5.81 MIL/uL   Hemoglobin 9.1 (L) 13.0 - 17.0 g/dL   HCT 29.5 (L) 39.0 - 52.0 %   MCV 98.7 80.0 - 100.0 fL   MCH 30.4 26.0 - 34.0 pg   MCHC 30.8 30.0 - 36.0 g/dL   RDW 19.6 (H) 11.5 - 15.5 %   Platelets 54 (L) 150 - 400 K/uL    Comment: Immature Platelet Fraction may be clinically indicated, consider ordering this additional test [id number] CONSISTENT WITH PREVIOUS RESULT    nRBC 1.3 (H) 0.0 - 0.2 %    Comment: Performed at [facility], [place] [address], [place],  [place] [address]  Basic metabolic panel     Status: Abnormal   Collection Time: 08/15/19  4:33 AM  Result Value Ref Range   Sodium 146 (H) 135 - 145 mmol/L   Potassium 3.8 3.5 - 5.1 mmol/L   Chloride 113 (H) 98 - 111 mmol/L   CO2 24 22 - 32 mmol/L   Glucose, Bld 126 (H) 70 - 99 mg/dL   BUN 28 (H) 6 - 20 mg/dL   Creatinine, Ser 0.52 (L) 0.61 - 1.24 mg/dL   Calcium 8.6 (L) 8.9 - 10.3 mg/dL   GFR calc non Af Amer >60 >60 mL/min   GFR calc Af Amer >60 >60 mL/min   Anion gap 9 5 - 15    Comment: Performed at [facility] [address], [place], [place] [address]  Lactate dehydrogenase     Status: Abnormal   Collection Time: 08/15/19  4:33 AM  Result Value Ref Range   LDH 383 (H) 98 - 192 U/L    Comment: Performed at [facility], [place] [address], [place], [place] [address]  Protime-INR     Status: None   Collection Time: 08/15/19  4:33 AM  Result Value Ref Range   Prothrombin Time 14.3 11.4 - 15.2 seconds   INR 1.1 0.8 - 1.2    Comment: (NOTE) INR goal varies based on device and disease states. Performed at [facility], [place] [address] [place], [place] [address]  Fibrinogen     Status: None   Collection Time: 08/15/19  4:33 AM  Result Value Ref Range   Fibrinogen 384 210 - 475 mg/dL    Comment: Performed at [facility] [address] [place], [place] [address]  Hepatic function panel     Status: Abnormal   Collection Time: 08/15/19  4:33 AM  Result Value Ref Range   Total Protein 5.1 (L) 6.5 - 8.1 g/dL   Albumin 2.9 (L) 3.5 - 5.0 g/dL   AST 18 15 - 41 U/L   ALT 29 0 - 44 U/L   Alkaline Phosphatase 46 38 - 126 U/L   Total Bilirubin 0.9 0.3 - 1.2 mg/dL   Bilirubin, Direct 0.1 0.0 - 0.2 mg/dL   Indirect Bilirubin 0.8 0.3 - 0.9 mg/dL    Comment: Performed at [facility] [address], [place], [place] [address]  Lactic acid, plasma     Status: None   Collection Time: 08/15/19  4:33 AM  Result Value Ref Range   Lactic Acid, Venous 1.2 0.5 - 1.9  mmol/L    Comment: Performed at [facility] [address], [place], [place] [address]  Triglycerides     Status: Abnormal   Collection Time: 08/15/19  4:33 AM  Result Value Ref Range   Triglycerides 180 (H) <150 mg/dL    Comment: Performed at [facility] [address]  [place],  [address]  APTT     Status: Abnormal   Collection Time: 08/15/19  4:33 AM  Result Value Ref Range   aPTT 66 (H) 24 - 36 seconds    Comment:        IF BASELINE aPTT IS ELEVATED, SUGGEST PATIENT RISK ASSESSMENT BE USED TO DETERMINE APPROPRIATE ANTICOAGULANT THERAPY. Performed at [facility], [place] [address] [place], [place] [address]   Heparin level (unfractionated)     Status: Abnormal   Collection Time: 08/15/19  4:33 AM  Result Value Ref Range   Heparin Unfractionated 0.25 (L) 0.30 - 0.70 IU/mL    Comment: (NOTE) If heparin results are below expected values, and patient dosage has  been confirmed, suggest follow up testing of antithrombin III levels. Performed at [facility], [place] [address] [place], [place] [address]   Glucose, capillary     Status: Abnormal   Collection Time: 08/15/19  4:44 AM  Result Value Ref Range   Glucose-Capillary 120 (H) 70 - 99 mg/dL  I-STAT 7, (LYTES, BLD GAS, ICA, H+H)     Status: Abnormal   Collection Time: 08/15/19  4:46 AM  Result Value Ref Range   pH, Arterial 7.446 7.350 - 7.450   pCO2 arterial 36.8 32.0 - 48.0 mmHg   pO2, Arterial 62.0 (L) 83.0 - 108.0 mmHg   Bicarbonate 25.4 20.0 - 28.0 mmol/L   TCO2 27 22 - 32 mmol/L   O2 Saturation 93.0 %   Acid-Base Excess 1.0 0.0 - 2.0 mmol/L   Sodium 146 (H) 135 - 145 mmol/L   Potassium 3.9 3.5 - 5.1 mmol/L   Calcium, Ion 1.31 1.15 - 1.40 mmol/L   HCT 26.0 (L) 39.0 - 52.0 %   Hemoglobin 8.8 (L) 13.0 - 17.0 g/dL   Patient temperature 98.2 F    Sample type ARTERIAL   .Cooxemetry Panel (carboxy, met, total hgb, O2 sat)     Status: Abnormal   Collection Time: 08/15/19  4:51 AM   Result Value Ref Range  Total hemoglobin 10.9 (L) 12.0 - 16.0 g/dL   O2 Saturation 83.9 %   Carboxyhemoglobin 3.8 (H) 0.5 - 1.5 %   Methemoglobin 1.6 (H) 0.0 - 1.5 %    Comment: Performed at [facility] [address], [place], [place] [address]  Glucose, capillary     Status: Abnormal   Collection Time: 08/15/19  7:57 AM  Result Value Ref Range   Glucose-Capillary 117 (H) 70 - 99 mg/dL  Glucose, capillary     Status: Abnormal   Collection Time: 08/15/19 12:01 PM  Result Value Ref Range   Glucose-Capillary 151 (H) 70 - 99 mg/dL    DG CHEST PORT 1 VIEW  Result Date: 08/15/2019 CLINICAL DATA:  ARDS, COVID positive 30 days prior, history of ECMO EXAM: PORTABLE CHEST 1 VIEW COMPARISON:  Radiograph 08/14/2019 FINDINGS: *Tracheostomy tube in the mid trachea. *Transesophageal tube tip below the GE junction, beyond the level of imaging. *Right upper extremity PICC terminates at the superior cavoatrial junction. *ECMO cannula in stable position. *Bilateral chest tubes in stable position. Persistent dense airspace opacity throughout the right lung with developing air bronchograms. Much of the left hemithorax is opacified as well with some residual aerated lung in the apex. No clear pneumothorax. Cardiomediastinal contours are obscured. No acute osseous or soft tissue abnormality. IMPRESSION: Persistent opacification of the entire right hemithorax with developing air bronchograms. Opacification the left hemithorax is similar to prior. Stable appearance of support devices. Electronically Signed   By: Lovena Le M.D.   On: 08/15/2019 06:33   DG CHEST PORT 1 VIEW  Result Date: 08/14/2019 CLINICAL DATA:  COVID-19 positive, recent ECMO, persistent cardiorespiratory problems EXAM: PORTABLE CHEST 1 VIEW COMPARISON:  Radiograph 08/13/2019 FINDINGS: *Tracheostomy tube terminates in the mid trachea. *Transesophageal feeding tube terminates below the level of imaging. *Bilateral chest tubes are in stable  position. *Right upper extremity PICC terminates at the expected location of the superior cavoatrial junction. *ECMO cannula projects over the low right chest Diminishing lung volumes with now complete opacification of the right hemithorax and only minimal visible aerated lung in the left apex. Cardiac silhouette and diaphragm are obscured. No acute osseous or soft tissue abnormality is seen. IMPRESSION: 1. Now complete opacification of the right hemithorax and only minimal visible aerated lung in the left apex. Likely reflecting either diminishing lung volumes or worsening airspace disease or effusion. 2. Stable position of support devices. Electronically Signed   By: Lovena Le M.D.   On: 08/14/2019 06:21    Review of Systems  Unable to perform ROS: Patient unresponsive   Blood pressure (!) 158/63, pulse (!) 57, temperature 98.7 F (37.1 C), temperature source Axillary, resp. rate (!) 38, height 5' 8" (1.727 m), weight 129.9 kg, SpO2 94 %. Physical Exam  Constitutional: He appears well-developed and well-nourished. No distress.  HENT:  Head: Normocephalic and atraumatic.  Right Ear: External ear normal.  Left Ear: External ear normal.  Mouth/Throat: Oropharynx is clear and moist.  Bleeding from both nasal passages.  Feeding tube in left nasal passage.  Eyes:  Eyes closed  Neck:  Trach in place.  Cardiovascular:  On ECMO.  Respiratory:  On ECMO.  Neurological:  Unresponsive.  Skin: Skin is warm and dry.  Psychiatric:  Unresponsive.    Assessment/Plan: Bilateral epistaxis, anti-coagulation  Both nasal passages were packed with 10 cm Merocel packs.  I recommended apply Afrin to the packs daily and leave the packs in place for five days.  They can be  removed at that time but he may require replacement if he keeps bleeding.  More definitive intervention is not possible at this time.  [name] 08/15/2019, 12:03 PM

## 2019-08-15 NOTE — Procedures (Signed)
Preop diagnosis: Bilateral epistaxis, anti-coagulation Postop diagnosis: same Procedure: Ant/post packing of both nasal passages Surgeon: Redmond Baseman Anesth: None Findings: Bleeding from both nasal passages. Description:  The patient is sedated on ECMO.  Two 10 cm Merocel packs were coated with surgilube and placed fully into both nasal passages.  He tolerated the procedure well and was returned to nursing care in the same condition.

## 2019-08-16 ENCOUNTER — Inpatient Hospital Stay (HOSPITAL_COMMUNITY): Payer: Managed Care, Other (non HMO)

## 2019-08-16 LAB — BPAM RBC
Blood Product Expiration Date: 202102042359
Blood Product Expiration Date: 202102042359
Blood Product Expiration Date: 202102042359
Blood Product Expiration Date: 202102042359
ISSUE DATE / TIME: 202101042010
ISSUE DATE / TIME: 202101042010
ISSUE DATE / TIME: 202101042010
ISSUE DATE / TIME: 202101042010
Unit Type and Rh: 5100
Unit Type and Rh: 5100
Unit Type and Rh: 5100
Unit Type and Rh: 5100

## 2019-08-16 LAB — POCT I-STAT 7, (LYTES, BLD GAS, ICA,H+H)
Acid-base deficit: 2 mmol/L (ref 0.0–2.0)
Acid-base deficit: 2 mmol/L (ref 0.0–2.0)
Acid-base deficit: 1 mmol/L (ref 0.0–2.0)
Acid-base deficit: 1 mmol/L (ref 0.0–2.0)
Bicarbonate: 25.2 mmol/L (ref 20.0–28.0)
Bicarbonate: 22.7 mmol/L (ref 20.0–28.0)
Bicarbonate: 22.5 mmol/L (ref 20.0–28.0)
Bicarbonate: 23.6 mmol/L (ref 20.0–28.0)
Bicarbonate: 24.8 mmol/L (ref 20.0–28.0)
Calcium, Ion: 1.27 mmol/L (ref 1.15–1.40)
Calcium, Ion: 1.21 mmol/L (ref 1.15–1.40)
Calcium, Ion: 1.29 mmol/L (ref 1.15–1.40)
Calcium, Ion: 1.23 mmol/L (ref 1.15–1.40)
Calcium, Ion: 1.27 mmol/L (ref 1.15–1.40)
HCT: 27 % — ABNORMAL LOW (ref 39.0–52.0)
HCT: 28 % — ABNORMAL LOW (ref 39.0–52.0)
HCT: 27 % — ABNORMAL LOW (ref 39.0–52.0)
HCT: 29 % — ABNORMAL LOW (ref 39.0–52.0)
HCT: 30 % — ABNORMAL LOW (ref 39.0–52.0)
Hemoglobin: 9.2 g/dL — ABNORMAL LOW (ref 13.0–17.0)
Hemoglobin: 9.5 g/dL — ABNORMAL LOW (ref 13.0–17.0)
Hemoglobin: 9.2 g/dL — ABNORMAL LOW (ref 13.0–17.0)
Hemoglobin: 9.9 g/dL — ABNORMAL LOW (ref 13.0–17.0)
Hemoglobin: 10.2 g/dL — ABNORMAL LOW (ref 13.0–17.0)
O2 Saturation: 89 %
O2 Saturation: 91 %
O2 Saturation: 86 %
O2 Saturation: 89 %
O2 Saturation: 83 %
Patient temperature: 98.2
Patient temperature: 98.2
Patient temperature: 98.4
Patient temperature: 36.4
Patient temperature: 36.6
Potassium: 3.8 mmol/L (ref 3.5–5.1)
Potassium: 3.8 mmol/L (ref 3.5–5.1)
Potassium: 3.8 mmol/L (ref 3.5–5.1)
Potassium: 4.1 mmol/L (ref 3.5–5.1)
Potassium: 4 mmol/L (ref 3.5–5.1)
Sodium: 144 mmol/L (ref 135–145)
Sodium: 142 mmol/L (ref 135–145)
Sodium: 142 mmol/L (ref 135–145)
Sodium: 144 mmol/L (ref 135–145)
Sodium: 145 mmol/L (ref 135–145)
TCO2: 26 mmol/L (ref 22–32)
TCO2: 24 mmol/L (ref 22–32)
TCO2: 24 mmol/L (ref 22–32)
TCO2: 25 mmol/L (ref 22–32)
TCO2: 26 mmol/L (ref 22–32)
pCO2 arterial: 39.9 mmHg (ref 32.0–48.0)
pCO2 arterial: 35.6 mmHg (ref 32.0–48.0)
pCO2 arterial: 36.7 mmHg (ref 32.0–48.0)
pCO2 arterial: 37.6 mmHg (ref 32.0–48.0)
pCO2 arterial: 46.8 mmHg (ref 32.0–48.0)
pH, Arterial: 7.408 (ref 7.350–7.450)
pH, Arterial: 7.412 (ref 7.350–7.450)
pH, Arterial: 7.396 (ref 7.350–7.450)
pH, Arterial: 7.403 (ref 7.350–7.450)
pH, Arterial: 7.33 — ABNORMAL LOW (ref 7.350–7.450)
pO2, Arterial: 56 mmHg — ABNORMAL LOW (ref 83.0–108.0)
pO2, Arterial: 59 mmHg — ABNORMAL LOW (ref 83.0–108.0)
pO2, Arterial: 51 mmHg — ABNORMAL LOW (ref 83.0–108.0)
pO2, Arterial: 54 mmHg — ABNORMAL LOW (ref 83.0–108.0)
pO2, Arterial: 50 mmHg — ABNORMAL LOW (ref 83.0–108.0)

## 2019-08-16 LAB — TYPE AND SCREEN
ABO/RH(D): O POS
Antibody Screen: NEGATIVE
Unit division: 0
Unit division: 0
Unit division: 0
Unit division: 0

## 2019-08-16 LAB — BASIC METABOLIC PANEL
Anion gap: 11 (ref 5–15)
Anion gap: 10 (ref 5–15)
BUN: 35 mg/dL — ABNORMAL HIGH (ref 6–20)
BUN: 41 mg/dL — ABNORMAL HIGH (ref 6–20)
CO2: 24 mmol/L (ref 22–32)
CO2: 22 mmol/L (ref 22–32)
Calcium: 8.8 mg/dL — ABNORMAL LOW (ref 8.9–10.3)
Calcium: 8.4 mg/dL — ABNORMAL LOW (ref 8.9–10.3)
Chloride: 107 mmol/L (ref 98–111)
Chloride: 110 mmol/L (ref 98–111)
Creatinine, Ser: 0.48 mg/dL — ABNORMAL LOW (ref 0.61–1.24)
Creatinine, Ser: 0.53 mg/dL — ABNORMAL LOW (ref 0.61–1.24)
GFR calc Af Amer: >60 mL/min (ref >60)
GFR calc Af Amer: >60 mL/min (ref >60)
GFR calc non Af Amer: >60 mL/min (ref >60)
GFR calc non Af Amer: >60 mL/min (ref >60)
Glucose, Bld: 114 mg/dL — ABNORMAL HIGH (ref 70–99)
Glucose, Bld: 195 mg/dL — ABNORMAL HIGH (ref 70–99)
Potassium: 3.7 mmol/L (ref 3.5–5.1)
Potassium: 4.1 mmol/L (ref 3.5–5.1)
Sodium: 142 mmol/L (ref 135–145)
Sodium: 142 mmol/L (ref 135–145)

## 2019-08-16 LAB — CBC
HCT: 29.9 % — ABNORMAL LOW (ref 39.0–52.0)
HCT: 30.9 % — ABNORMAL LOW (ref 39.0–52.0)
Hemoglobin: 9.1 g/dL — ABNORMAL LOW (ref 13.0–17.0)
Hemoglobin: 9.7 g/dL — ABNORMAL LOW (ref 13.0–17.0)
MCH: 30.4 pg (ref 26.0–34.0)
MCH: 29.8 pg (ref 26.0–34.0)
MCHC: 30.4 g/dL (ref 30.0–36.0)
MCHC: 31.4 g/dL (ref 30.0–36.0)
MCV: 100 fL (ref 80.0–100.0)
MCV: 94.8 fL (ref 80.0–100.0)
Platelets: 47 10*3/uL — ABNORMAL LOW (ref 150–400)
Platelets: 43 10*3/uL — ABNORMAL LOW (ref 150–400)
RBC: 2.99 MIL/uL — ABNORMAL LOW (ref 4.22–5.81)
RBC: 3.26 MIL/uL — ABNORMAL LOW (ref 4.22–5.81)
RDW: 18.7 % — ABNORMAL HIGH (ref 11.5–15.5)
RDW: 19.2 % — ABNORMAL HIGH (ref 11.5–15.5)
WBC: 7.7 10*3/uL (ref 4.0–10.5)
WBC: 7.5 10*3/uL (ref 4.0–10.5)
nRBC: 1.3 % — ABNORMAL HIGH (ref 0.0–0.2)
nRBC: 0.9 % — ABNORMAL HIGH (ref 0.0–0.2)

## 2019-08-16 LAB — CBC WITH DIFFERENTIAL/PLATELET
Abs Immature Granulocytes: 0.97 10*3/uL — ABNORMAL HIGH (ref 0.00–0.07)
Basophils Absolute: 0.1 10*3/uL (ref 0.0–0.1)
Basophils Relative: 1 %
Eosinophils Absolute: 0.2 10*3/uL (ref 0.0–0.5)
Eosinophils Relative: 3 %
HCT: 29.5 % — ABNORMAL LOW (ref 39.0–52.0)
Hemoglobin: 8.8 g/dL — ABNORMAL LOW (ref 13.0–17.0)
Immature Granulocytes: 13 %
Lymphocytes Relative: 18 %
Lymphs Abs: 1.3 10*3/uL (ref 0.7–4.0)
MCH: 30.1 pg (ref 26.0–34.0)
MCHC: 29.8 g/dL — ABNORMAL LOW (ref 30.0–36.0)
MCV: 101 fL — ABNORMAL HIGH (ref 80.0–100.0)
Monocytes Absolute: 0.9 10*3/uL (ref 0.1–1.0)
Monocytes Relative: 12 %
Neutro Abs: 3.9 10*3/uL (ref 1.7–7.7)
Neutrophils Relative %: 53 %
Platelets: 41 10*3/uL — ABNORMAL LOW (ref 150–400)
RBC: 2.92 MIL/uL — ABNORMAL LOW (ref 4.22–5.81)
RDW: 18.8 % — ABNORMAL HIGH (ref 11.5–15.5)
WBC Morphology: INCREASED
WBC: 7.3 10*3/uL (ref 4.0–10.5)
nRBC: 1.5 % — ABNORMAL HIGH (ref 0.0–0.2)

## 2019-08-16 LAB — APTT
aPTT: 121 seconds — ABNORMAL HIGH (ref 24–36)
aPTT: 95 seconds — ABNORMAL HIGH (ref 24–36)

## 2019-08-16 LAB — HEPATIC FUNCTION PANEL
ALT: 24 U/L (ref 0–44)
AST: 22 U/L (ref 15–41)
Albumin: 3 g/dL — ABNORMAL LOW (ref 3.5–5.0)
Alkaline Phosphatase: 47 U/L (ref 38–126)
Bilirubin, Direct: 0.2 mg/dL (ref 0.0–0.2)
Indirect Bilirubin: 0.8 mg/dL (ref 0.3–0.9)
Total Bilirubin: 1 mg/dL (ref 0.3–1.2)
Total Protein: 5.2 g/dL — ABNORMAL LOW (ref 6.5–8.1)

## 2019-08-16 LAB — COOXEMETRY PANEL
Carboxyhemoglobin: 3.9 % — ABNORMAL HIGH (ref 0.5–1.5)
Methemoglobin: 1.5 % (ref 0.0–1.5)
O2 Saturation: 78.6 %
Total hemoglobin: 11.6 g/dL — ABNORMAL LOW (ref 12.0–16.0)

## 2019-08-16 LAB — HEPARIN LEVEL (UNFRACTIONATED)
Heparin Unfractionated: 0.32 IU/mL (ref 0.30–0.70)
Heparin Unfractionated: 0.58 IU/mL (ref 0.30–0.70)
Heparin Unfractionated: 0.46 IU/mL (ref 0.30–0.70)

## 2019-08-16 LAB — PROTIME-INR
INR: 1.1 (ref 0.8–1.2)
Prothrombin Time: 14.4 seconds (ref 11.4–15.2)

## 2019-08-16 LAB — GLUCOSE, CAPILLARY
Glucose-Capillary: 113 mg/dL — ABNORMAL HIGH (ref 70–99)
Glucose-Capillary: 115 mg/dL — ABNORMAL HIGH (ref 70–99)
Glucose-Capillary: 172 mg/dL — ABNORMAL HIGH (ref 70–99)
Glucose-Capillary: 204 mg/dL — ABNORMAL HIGH (ref 70–99)
Glucose-Capillary: 119 mg/dL — ABNORMAL HIGH (ref 70–99)
Glucose-Capillary: 152 mg/dL — ABNORMAL HIGH (ref 70–99)

## 2019-08-16 LAB — FIBRINOGEN: Fibrinogen: 443 mg/dL (ref 210–475)

## 2019-08-16 LAB — TRIGLYCERIDES: Triglycerides: 195 mg/dL — ABNORMAL HIGH (ref <150)

## 2019-08-16 LAB — PREPARE RBC (CROSSMATCH)

## 2019-08-16 LAB — LACTATE DEHYDROGENASE: LDH: 466 U/L — ABNORMAL HIGH (ref 98–192)

## 2019-08-16 LAB — LACTIC ACID, PLASMA: Lactic Acid, Venous: 1.3 mmol/L (ref 0.5–1.9)

## 2019-08-16 MED ORDER — SODIUM CHLORIDE 0.9% IV SOLUTION: Freq: Once | INTRAVENOUS | Status: AC

## 2019-08-16 MED ORDER — MAGNESIUM HYDROXIDE 400 MG/5ML PO SUSP: 15.0000 mL | Freq: Every evening | ORAL | Status: DC | PRN

## 2019-08-16 MED ORDER — NOREPINEPHRINE 4 MG/250ML-% IV SOLN
0.0000 ug/min | INTRAVENOUS | Status: AC
  Administered 2019-08-16 – 2019-08-21 (×3): 2 ug/min via INTRAVENOUS
  Administered 2019-08-22: 4 ug/min via INTRAVENOUS
  Administered 2019-08-23 (×2): 6 ug/min via INTRAVENOUS
  Administered 2019-08-24: 4 ug/min via INTRAVENOUS
  Administered 2019-08-26: 1 ug/min via INTRAVENOUS
  Administered 2019-08-28: 3 ug/min via INTRAVENOUS
  Administered 2019-08-30: 4 ug/min via INTRAVENOUS
  Filled 2019-08-16 (×11): qty 250

## 2019-08-16 MED ORDER — SENNOSIDES-DOCUSATE SODIUM 8.6-50 MG PO TABS
1.0000 | ORAL_TABLET | Freq: Two times a day (BID) | ORAL | Status: DC | PRN
  Administered 2019-08-18 – 2019-09-25 (×2): 1
  Filled 2019-08-16 (×2): qty 1

## 2019-08-16 MED ORDER — PANTOPRAZOLE SODIUM 40 MG PO PACK
40.0000 mg | PACK | Freq: Every day | ORAL | Status: DC
  Administered 2019-08-16 – 2019-09-19 (×33): 40 mg
  Filled 2019-08-16 (×35): qty 20

## 2019-08-16 MED ORDER — DIAZEPAM 1 MG/ML PO SOLN
10.0000 mg | Freq: Four times a day (QID) | ORAL | Status: DC
  Administered 2019-08-16 – 2019-08-17 (×4): 10 mg
  Filled 2019-08-16 (×4): qty 10

## 2019-08-16 MED ORDER — FUROSEMIDE 10 MG/ML IJ SOLN
40.0000 mg | Freq: Once | INTRAMUSCULAR | Status: AC
  Administered 2019-08-16: 40 mg via INTRAVENOUS
  Filled 2019-08-16: qty 4

## 2019-08-16 MED ORDER — EPINEPHRINE 1 MG/10ML IJ SOSY
PREFILLED_SYRINGE | INTRAMUSCULAR | Status: AC
  Filled 2019-08-16: qty 10

## 2019-08-16 MED ORDER — FUROSEMIDE 10 MG/ML IJ SOLN
20.0000 mg | Freq: Once | INTRAMUSCULAR | Status: AC
  Administered 2019-08-16: 20 mg via INTRAVENOUS
  Filled 2019-08-16: qty 2

## 2019-08-16 MED ORDER — FREE WATER
200.0000 mL | Freq: Three times a day (TID) | Status: DC
  Administered 2019-08-16 – 2019-08-18 (×7): 200 mL

## 2019-08-16 MED ORDER — HYDROMORPHONE HCL 1 MG/ML PO LIQD
2.0000 mg | ORAL | Status: DC
  Administered 2019-08-16 – 2019-08-17 (×7): 2 mg
  Filled 2019-08-16 (×7): qty 2

## 2019-08-16 MED ORDER — HEPARIN BOLUS VIA INFUSION
2000.0000 [IU] | Freq: Once | INTRAVENOUS | Status: AC
  Administered 2019-08-16: 2000 [IU] via INTRAVENOUS
  Filled 2019-08-16: qty 2000

## 2019-08-16 MED ORDER — QUETIAPINE FUMARATE 100 MG PO TABS
100.0000 mg | ORAL_TABLET | Freq: Two times a day (BID) | ORAL | Status: DC
  Administered 2019-08-16 – 2019-08-21 (×11): 100 mg
  Filled 2019-08-16 (×12): qty 1

## 2019-08-16 NOTE — Procedures (Signed)
Bronchoscopy Procedure Note MOUA RASMUSSON [id number] [birth year]  Procedure: Bronchoscopy Indications: Diagnostic evaluation of the airways  Procedure Details Consent: Risks of procedure as well as the alternatives and risks of each were explained to the (patient/caregiver).  Consent for procedure obtained. Time Out: Verified patient identification, verified procedure, site/side was marked, verified correct patient position, special equipment/implants available, medications/allergies/relevent history reviewed, required imaging and test results available.  Performed  In preparation for procedure, patient was given 100% FiO2. Sedation: Benzodiazepines and Muscle relaxants  Airway entered and the following bronchi were examined: RUL, RML, RLL, LUL, LLL and Bronchi.   Procedures performed: a small amount of bloody secretions were removed from the LUL and RML. Bronchoscope removed.    Evaluation Hemodynamic Status: BP stable throughout; O2 sats: stable throughout Patient's Current Condition: stable Specimens:  None Complications: No apparent complications Patient did tolerate procedure well.   Einar Grad Nyhla Mountjoy 08/16/2019

## 2019-08-16 NOTE — Progress Notes (Signed)
   Palliative Medicine Inpatient Follow Up Note   HPI: 44 y/o M admitted 12/14 with COVIDpneumonia causing acutehypoxemic respiratory failure. Developed pneumomediastinum 12/23 with concern for possible bilateral small pneumothoraces on 12/24. PCCM consulted for evaluation of pneumothoraces.  Today's Discussion (08/16/2019): Reviewed chart. Met with Nicole Kindred at bedside along with his RN. His RN shared that the team had to change a circuit today which went fairly well. There is some aeration improvement on the most recent CXR as well which we talked about.   Reached out to Dr. Orvan Seen who said that the patient will take time to show improvement, anticipate two weeks more on ECMO for lung healing. He stated that at the present time it is too premature to discuss prognosis.   Communicated with patients Mother, Helene Kelp who said that she is praying everyday. She said that she realizes it is a "waiting game" at this point.   Family seemed pleased at the updates they have been receiving from all staff and providers. It no longer appears that there is fear of our service communicating with them regularly.   Discussed with patients family the importance of continued conversation with family and their  medical providers regarding overall plan of care and treatment options, ensuring decisions are within the context of the patients values and GOCs.  Offered palliative care assistance in whatever capacity that may be.  Will continue to communicate with family incrementally.  Time In: 1330 Time Out: 9784 Time Spent: 25 Greater than 50% of the time was spent in counseling and coordination of care ______________________________________________________________________________________ [facility] Team Team Cell Phone: [phone] Please utilize secure chat with additional questions, if there is no response within 30 minutes please call the above phone  number  Palliative Medicine Team providers are available by phone from 7am to 7pm daily and can be reached through the team cell phone.  Should this patient require assistance outside of these hours, please call the patient's attending physician.

## 2019-08-16 NOTE — Progress Notes (Signed)
Versed decreased from 24 to 60m/hr r/t biss  26. Pt sats dropped and abdominal movement increased. Pt synchronous with vent and other VS stable. Sedation increased and changes to ecmo per Rebeca, RN. Will continue to monitor closely

## 2019-08-16 NOTE — Progress Notes (Signed)
ANTICOAGULATION CONSULT NOTE  Pharmacy Consult for heparin Indication: ECMO  No Known Allergies  Patient Measurements: Height: 5' 8"  (172.7 cm) Weight: 281 lb 12 oz (127.8 kg) IBW/kg (Calculated) : 68.4 Heparin Dosing Weight: 100.7 kg  Vital Signs: Temp: 97.7 F (36.5 C) (01/08 1525) Temp Source: Core (01/08 1525) BP: 144/57 (01/08 1415) Pulse Rate: 71 (01/08 1620)  Labs: Recent Labs    08/14/19 0438 08/14/19 0441 08/14/19 2113 08/15/19 0433 08/15/19 0446 08/15/19 1612 08/15/19 2257 08/16/19 0453 08/16/19 0501 08/16/19 1317 08/16/19 1657  HGB 8.9*  --   --  9.1*  --  9.5*  --  9.1* 9.2* 8.8* 9.7*  HCT 28.5*  --   --  29.5*  --  30.8*  --  29.9* 27.0* 29.5* 30.9*  PLT 53*   < >  --  54*  --  52*  --  47*  --  41* 43*  APTT 56*  --  73* 66*  --   --   --   --   --   --  121*  LABPROT 16.9*  --   --  14.3  --   --   --  14.4  --   --   --   INR 1.4*  --   --  1.1  --   --   --  1.1  --   --   --   HEPARINUNFRC  --    < > 0.23* 0.25*   < >  --  0.31 0.32  --   --  0.58  CREATININE 0.43*   < >  --  0.52*  --  0.60*  --  0.48*  --   --  0.53*   < > = values in this interval not displayed.    Estimated Creatinine Clearance: 152.1 mL/min (A) (by C-G formula based on SCr of 0.53 mg/dL (L)).  Assessment: 44 yo male known COVID+ and ARDS VV ECMO. Heparin was restarted today (bivalirudin stopped as HIT test was negative).  Heparin level was slightly therapeutic at 0.32. Hgb 9.2, plt 47 (down slightly from yesterday). LDH increased from 383 to 466. Fibrinogen 443. Bleeding now stabilized.   PM follow up:  Heparin level now 0.58, but aPTT 121,  no bleeding reported by RN   Goal of Therapy:  Heparin level: 0.3-0.5 APTT goal: 66-85 sec Monitor platelets by anticoagulation protocol: Yes   Plan:  -Decrease IV heparin to 1500 units/hr. -Recheck aPTT/Heparin level in 6 hrs. -Order heparin level and aPTT in 12 hours -Monitor s/sx of bleeding  [name],  [place] Pharmacist Phone [phone]  08/16/2019 6:30 PM

## 2019-08-16 NOTE — Progress Notes (Signed)
NAME:  [name], MRN:  [id number], DOB:  Aug 25, 1975, LOS: 25 ADMISSION DATE:  07/22/2019, CONSULTATION DATE:  12/24 REFERRING MD:  Sloan Leiter, CHIEF COMPLAINT:  Dyspnea   Brief History   44 y/o M admitted 12/14 with COVID pneumonia causing acute hypoxemic respiratory failure. Developed pneumomediastinum 12/23 with concern for possible bilateral small pneumothoraces on 12/24.  PCCM consulted for evaluation of pneumothoraces  Past Medical History  GERD HTN Asthma  Significant Hospital Events   12/14 admit with hypoxemic respiratory failure in setting of COVID-19 pneumonia 12/23 noted to have pneumomediastinum on chest x-ray 12/24 PCCM consulted for evaluation 12/26 worsening hypoxemia, intubated  12/27 VV fem/fem ECMO cannulation 12/28 ETT switch 12/29 Tracheostomy 12/31 Oozing from multiple sites, H/H drop. Heparin held briefly and lidocaine with epi injected around trach site, Cryo, DDVAP given 1/3 CVL being discontinued  1/5: repacked nares  Consults:  PCCM  Procedures:  1/4 bronch: bal performed  Significant Diagnostic Tests:  CT chest 12/22 >> extensive pneumomediastinum, multifocal patchy bilateral groundglass opacities  Micro Data:  BCx2 12/14 >> negative  Sputum 12/15 >> negative  1/4 bronch:  1/2 acid fast smear bronch: negative 1/2 fungal cx bronch: pending  Antimicrobials:  Received remdesivir, steroids, actemra and convalescent plasma  5 days cefepime 12/24-12/29 Cefepime 1/4-> vanc 1/4->   Interim history/subjective:  1/7: vent changes yesterday, some increased secretions overnight with some bloody clots removed. May replace nasal packing today. Cont diuresis today and ct chest tomorrow. Weaning paralytic and sedation with plans to increase PO benzo/narcotic to decrease IV 1/6:Nasal packing yesterday late afternoon. Sweep increased overnight. Remains chemically paralyzed which has improved chatter.  Heart Failure plans to diurese today.  Objective    Blood pressure (!) 183/57, pulse 66, temperature 98.2 F (36.8 C), temperature source Oral, resp. rate 15, height _0  (1.727 m), weight 127.8 kg, SpO2 91 %.    Vent Mode: PRVC FiO2 (%):  [40 %] 40 % Set Rate:  [15 bmp] 15 bmp Vt Set:  [100 mL] 100 mL PEEP:  [12 cmH20] 12 cmH20 Plateau Pressure:  [27 cmH20-30 cmH20] 27 cmH20   Intake/Output Summary (Last 24 hours) at 08/16/2019 0838 Last data filed at 08/16/2019 0800 Gross per 24 hour  Intake 5823.24 ml  Output 5505 ml  Net 318.24 ml   Filed Weights   08/14/19 0500 08/15/19 0500 08/16/19 0500  Weight: 134.2 kg 129.9 kg 127.8 kg   Examination GEN: Critically ill appearing middle aged male. Sedated and chemically paralyzed, trach/vent, on VV ECMO. Moderate obesity HEENT: [employer]. Nasal packing with serosang drainage noted Trach intact without bleeding. Pink mmm.  CV: RRR. 2+ radial pulses. No rgm.  PULM: Minimal chest expansion with very low tidal volumes. Expansion is symmetrical. Bronchial breath sounds throughout. Chest tubes with serosanguinous output.  R with small air-leak. Vt 90 with driving pressure of 90 GI: Obese, soft, active bowel sounds EXT: Symmetrical bulk and tone without cyanosis or clubbing. Bilateral femoral cannulas, insertion sites are clean and dry without bleeding.  NEURO: Sedated and chemically paralyzed. PERR.  SKIN: Pale, cool, clean, dry. Few small, round, pustular eruptions across chest.   Resolved Hospital Problem list    Assessment & Plan:    Critically ill due to acute hypoxemic respiratory failure with ARDS secondary to COVID-19, s/p tracheostomy requiring mechanical ventilation and VV ECMO support. Continue ECMO at current flow rate.  Bronchoscopy for secretion clearance Continue to follow Vt on PC. Keep Driving pressure 15 of less.  Staph epi  and serratia marcescens pneumonia:  cefepime and vancomycin for 7 days  Bilateral pneumothoraces and pneumomediastinum R with some leak (likely where loss  of tv is) Continue suction  -20cmH2O  Nose bleeds  repacked 1/5 after discussion with ENT  Continue antibiotics (on for above pna) while packing in place for TSS prevention (ceftriaxone otherwise)   Daily Goals Checklist  Pain/Anxiety/Delirium protocol (if indicated): morphine and valium seroquel enterally. Dilaudid, propofol, versed infusions. Increase enteral agents to decrease drip dependence. Convert morphine to dilaudid due to flushing.  VAP protocol (if indicated): Bundle in place. Respiratory support goals: Continue PRVC with ultra-lung protective ventilation  Blood pressure target: On vasopressin at 0.02 to keep MAP >65 - should improve with decrease sedation DVT prophylaxis: on systemic anticoagulation on VV-ECMO Nutrition Status: Nutrition Problem: Increased nutrient needs Etiology: acute illness(COVID PNA) Signs/Symptoms: estimated needs Interventions: Tube feeding, Prostat GI prophylaxis: Protonix per tube. Fluid status goals: diuresis to achieve - 1 to -1.5. (still net even by I/O) Urinary catheter: Guide hemodynamic management Glucose control: euglycemic on SSI. Mobility/therapy needs: Bedrest.  Antibiotic de-escalation: complete 7 days of antibiotics for pneumonia. Home medication reconciliation: reviewed. Daily labs: as per ECMO protocol. Code Status: Full code  Family Communication: Will update wife. Disposition: ICU   Labs   CBC: Recent Labs  Lab 08/14/19 0438 08/14/19 1520 08/15/19 0433 08/15/19 1612 08/15/19 1618 08/15/19 2307 08/16/19 0453 08/16/19 0501  WBC 8.4 7.6 7.8 7.3  --   --  7.7  --   HGB 8.9* 9.3* 9.1* 9.5* 10.2* 10.2* 9.1* 9.2*  HCT 28.5* 29.5* 29.5* 30.8* 30.0* 30.0* 29.9* 27.0*  MCV 97.3 96.7 98.7 99.0  --   --  100.0  --   PLT 53* 53* 54* 52*  --   --  47*  --     Basic Metabolic Panel: Recent Labs  Lab 08/10/19 0412 08/10/19 0615 08/13/19 1639 08/14/19 0438 08/14/19 1520 08/15/19 0433 08/15/19 0446 08/15/19 1612  08/15/19 1618 08/15/19 2307 08/16/19 0501  NA 140  --  141 141 144 146* 146* 143 145 145 144  K 4.7  --  4.5 4.2 4.4 3.8 3.9 4.2 4.3 4.0 3.8  CL 108   < > 108 108 111 113*  --  110  --   --   --   CO2 26   < > _0 --  24  --   --   --   GLUCOSE 161*   < > 172* 156* 176* 126*  --  150*  --   --   --   BUN 35*   < > 30* 26* 27* 28*  --  30*  --   --   --   CREATININE 0.49*   < > 0.50* 0.43* 0.48* 0.52*  --  0.60*  --   --   --   CALCIUM 8.3*   < > 8.6* 8.8* 9.0 8.6*  --  9.0  --   --   --   MG 2.1  --   --   --   --   --   --   --   --   --   --   PHOS 3.4  --   --   --   --   --   --   --   --   --   --    < > = values in this interval not displayed.   GFR: Estimated Creatinine Clearance: 152.1  mL/min (A) (by C-G formula based on SCr of 0.6 mg/dL (L)). Recent Labs  Lab 08/13/19 0427 08/14/19 0438 08/14/19 1520 08/15/19 0433 08/15/19 1612 08/16/19 0453  WBC 8.5 8.4 7.6 7.8 7.3 7.7  LATICACIDVEN 1.9 1.5  --  1.2  --  1.3    Liver Function Tests: Recent Labs  Lab 08/11/19 0509 08/12/19 0420 08/13/19 0427 08/14/19 0438 08/15/19 0433  AST _0 ALT 44 38 33 33 29  ALKPHOS 37* 37* 43 46 46  BILITOT 0.7 0.8 0.6 0.9 0.9  PROT 4.3* 5.1* 5.1* 5.1* 5.1*  ALBUMIN 2.9* 3.7 3.4* 3.1* 2.9*   No results for input(s): LIPASE, AMYLASE in the last 168 hours. No results for input(s): AMMONIA in the last 168 hours.  ABG    Component Value Date/Time   PHART 7.408 08/16/2019 0501   PCO2ART 39.9 08/16/2019 0501   PO2ART 56.0 (L) 08/16/2019 0501   HCO3 25.2 08/16/2019 0501   TCO2 26 08/16/2019 0501   ACIDBASEDEF 3.0 (H) 08/13/2019 2056   O2SAT 78.6 08/16/2019 0505     Coagulation Profile: Recent Labs  Lab 08/12/19 0420 08/13/19 0427 08/14/19 0438 08/15/19 0433 08/16/19 0453  INR 1.2 1.5* 1.4* 1.1 1.1    Cardiac Enzymes: No results for input(s): CKTOTAL, CKMB, CKMBINDEX, TROPONINI in the last 168 hours.  HbA1C: Hgb A1c MFr Bld  Date/Time Value Ref  Range Status  07/24/2019 04:50 AM 6.7 (H) 4.8 - 5.6 % Final    Comment:    (NOTE) Pre diabetes:          5.7%-6.4% Diabetes:              >6.4% Glycemic control for   <7.0% adults with diabetes   09/30/2017 05:36 AM 5.7 (H) 4.8 - 5.6 % Final    Comment:    (NOTE)         Prediabetes: 5.7 - 6.4         Diabetes: >6.4         Glycemic control for adults with diabetes: <7.0     CBG: Recent Labs  Lab 08/15/19 1201 08/15/19 1617 08/15/19 2005 08/15/19 2305 08/16/19 0459  GLUCAP 151* 137* 83 121* 113*     Critical care time:     CRITICAL CARE Performed by: Kipp Brood   Total critical care time: 50 minutes  Critical care time was exclusive of separately billable procedures and treating other patients.  Critical care was necessary to treat or prevent imminent or life-threatening deterioration.  Critical care was time spent personally by me on the following activities: development of treatment plan with patient and/or surrogate as well as nursing, discussions with consultants, evaluation of patient's response to treatment, examination of patient, obtaining history from patient or surrogate, ordering and performing treatments and interventions, ordering and review of laboratory studies, ordering and review of radiographic studies, pulse oximetry, re-evaluation of patient's condition and participation in multidisciplinary rounds.  Kipp Brood, MD [facility] ICU Physician [place]  Pager: [phone] Mobile: [phone] After hours: [phone].   08/16/2019, 8:38 AM

## 2019-08-16 NOTE — Significant Event (Signed)
Circuit change out performed at the bedside.   Staff present: Myself, ECMO Specialist [place], Bedside RN Lonzo Cloud, ECMO Specialist [place], Perfusionist Attendings, RT and Pharmacy  Multidisciplinary care team determined a circuit change needed to be performed. Needed equipment was gathered and team assembled.  Time out was performed.  Sterile field was created.  ECMO clamped out at 1224.  Ecmo initiated on new circuit at 1226.  Pt had temporary episode of desaturation and hypotension treated by attendings at bedside.  Appropriate flows were achieved on new circuit and pt stabilized.    Cardiohelp Console #3    Oxygenator Lot# [id number] [place] Lot # [id number]  Karsten Ro

## 2019-08-16 NOTE — Progress Notes (Signed)
ANTICOAGULATION CONSULT NOTE - Follow Up Consult  Pharmacy Consult for heparin Indication: ECMO  Labs: Recent Labs    08/13/19 0427 08/13/19 0429 08/14/19 0438 08/14/19 1520 08/14/19 1602 08/14/19 2113 08/15/19 0433 08/15/19 1457 08/15/19 1612 08/15/19 1618 08/15/19 2257 08/15/19 2307  HGB 8.9*  --  8.9* 9.3*  --   --  9.1*  --  9.5* 10.2*  --  10.2*  HCT 27.7*  --  28.5* 29.5*  --   --  29.5*  --  30.8* 30.0*  --  30.0*  PLT 48*   < > 53* 53*  --   --  54*  --  52*  --   --   --   APTT 52*   < > 56*  --   --  73* 66*  --   --   --   --   --   LABPROT 17.9*  --  16.9*  --   --   --  14.3  --   --   --   --   --   INR 1.5*  --  1.4*  --   --   --  1.1  --   --   --   --   --   HEPARINUNFRC  --   --   --   --    < > 0.23* 0.25* 0.31  --   --  0.31  --   CREATININE 0.53*   < > 0.43* 0.48*  --   --  0.52*  --  0.60*  --   --   --    < > = values in this interval not displayed.    Assessment/Plan:  44yo male remains therapeutic on heparin. Will continue gtt at current rate and monitor level Q12H.   Wynona Neat, PharmD, BCPS  08/16/2019,12:11 AM

## 2019-08-16 NOTE — Procedures (Signed)
Tracheostomy tube changed due to air leak to a 17m Cuffed tube Distal XLT.  Procedure well tolerated.  RKipp Brood MD [facility] [place] Pager: [phone]: [phone] hours: [phone].  08/16/2019, 4:38 PM

## 2019-08-16 NOTE — Progress Notes (Addendum)
ANTICOAGULATION CONSULT NOTE  Pharmacy Consult for heparin Indication: ECMO  No Known Allergies  Patient Measurements: Height: 5' 8"  (172.7 cm) Weight: 281 lb 12 oz (127.8 kg) IBW/kg (Calculated) : 68.4 Heparin Dosing Weight: 100.7 kg  Vital Signs: Temp: 98.2 F (36.8 C) (01/08 0430) Temp Source: Oral (01/08 0430) BP: 183/57 (01/08 0000) Pulse Rate: 71 (01/08 0600)  Labs: Recent Labs    08/14/19 0438 08/14/19 1520 08/14/19 1602 08/14/19 2113 08/15/19 0433 08/15/19 1457 08/15/19 1612 08/15/19 1618 08/15/19 2257 08/15/19 2307 08/16/19 0453 08/16/19 0501  HGB 8.9* 9.3*  --   --  9.1*  --  9.5* 10.2*  --  10.2*  --  9.2*  HCT 28.5* 29.5*  --   --  29.5*  --  30.8* 30.0*  --  30.0*  --  27.0*  PLT 53* 53*  --   --  54*  --  52*  --   --   --   --   --   APTT 56*  --   --  73* 66*  --   --   --   --   --   --   --   LABPROT 16.9*  --   --   --  14.3  --   --   --   --   --   --   --   INR 1.4*  --   --   --  1.1  --   --   --   --   --   --   --   HEPARINUNFRC  --   --    < > 0.23* 0.25* 0.31  --   --  0.31  --  0.32  --   CREATININE 0.43* 0.48*  --   --  0.52*  --  0.60*  --   --   --   --   --    < > = values in this interval not displayed.    Estimated Creatinine Clearance: 152.1 mL/min (A) (by C-G formula based on SCr of 0.6 mg/dL (L)).  Assessment: 44 yo male known COVID+ and ARDS VV ECMO. Heparin was restarted today (bivalirudin stopped as HIT test was negative).  Heparin level was slightly therapeutic at 0.32. Hgb 9.2, plt 47 (down slightly from yesterday). LDH increased from 383 to 466. Fibrinogen 443. Bleeding now stabilized.    Goal of Therapy:  Heparin level: 0.3-0.5 APTT goal: 66-85 sec Monitor platelets by anticoagulation protocol: Yes   Plan:  -Continue heparin infusion to 1600 units/hr  -Order heparin level and aPTT in 12 hours -Monitor s/sx of bleeding  Antonietta Jewel, PharmD, [place] Pharmacist  Phone: [phone]  Please check AMION for  all [place] phone numbers After 10:00 PM, call [place] was exchange this afternoon. Received heparin 2000 units bolus. Continue at same rate of 1600 units/hr and get level in 6 hours after - will monitor for s/sx of bleeding.  Antonietta Jewel, PharmD, [place] Clinical Pharmacist

## 2019-08-16 NOTE — Progress Notes (Signed)
Patient ID: [name], male   DOB: 1975-08-25, 44 y.o.   MRN: [id number]    Advanced Heart Failure Rounding Note   Subjective:    Remains on VV ecmo with good flows. Remains sedated and paralyzed. Was lightened yesterday and became hypertensive and was chugging on circuit. Redsated  Received lasix 16m IV bid again yesterday with excellent urine output  Weight down 14 pounds in 2 days. Got 1 bottle albumin. Off pressors  Copious dried blood suctioned from trach again overnight.  Bival switched back to heparin.   Co-ox 79%  ABG 7.41/40/89/98%  CXR: Bilateral lung opacities c/w ARDS improving. Personally reviewed   On vent 50% PEEP 12. Driving pressure 12 No change. TV ~ 100 on PC   ECMO parameters Flow 5.3 RPM 3900 dP 39 Pressure -105   Objective:   Weight Range:  Vital Signs:   Temp:  [97.9 F (36.6 C)-98.7 F (37.1 C)] 98.2 F (36.8 C) (01/08 0430) Pulse Rate:  [53-89] 71 (01/08 0600) Resp:  [0-53] 15 (01/08 0600) BP: (152-183)/(57-60) 183/57 (01/08 0000) SpO2:  [86 %-99 %] 91 % (01/08 0600) Arterial Line BP: (118-182)/(47-68) 122/47 (01/08 0600) FiO2 (%):  [40 %] 40 % (01/08 0351) Weight:  [127.8 kg] 127.8 kg (01/08 0500) Last BM Date: 08/16/19  Weight change: Filed Weights   08/14/19 0500 08/15/19 0500 08/16/19 0500  Weight: 134.2 kg 129.9 kg 127.8 kg    Intake/Output:   Intake/Output Summary (Last 24 hours) at 08/16/2019 0729 Last data filed at 08/16/2019 0710 Gross per 24 hour  Intake 5609.04 ml  Output 5755 ml  Net -145.96 ml     Physical Exam: General:  Sedated on vent HEENT: normal + nasal packing Neck: supple. Hard to see P. Carotids 2+ bilat; no bruits. No lymphadenopathy or thryomegaly appreciated. Cor: PMI nondisplaced. Regular rate & rhythm. No rubs, gallops or murmurs. Lungs: minimal air movement Abdomen: obese soft, nontender, nondistended. No hepatosplenomegaly. No bruits or masses. Good bowel sounds. Extremities: no cyanosis,  clubbing, rash, trace edema cannula sites ok Neuro: sedated paralyzed   Telemetry: Sinus 70s Personally reviewed  Labs: Basic Metabolic Panel: Recent Labs  Lab 08/10/19 0412 08/10/19 0615 08/13/19 1639 08/14/19 0438 08/14/19 1520 08/15/19 0433 08/15/19 0446 08/15/19 1612 08/15/19 1618 08/15/19 2307 08/16/19 0501  NA 140  --  141 141 144 146* 146* 143 145 145 144  K 4.7  --  4.5 4.2 4.4 3.8 3.9 4.2 4.3 4.0 3.8  CL 108   < > 108 108 111 113*  --  110  --   --   --   CO2 26   < > _0 --  24  --   --   --   GLUCOSE 161*   < > 172* 156* 176* 126*  --  150*  --   --   --   BUN 35*   < > 30* 26* 27* 28*  --  30*  --   --   --   CREATININE 0.49*   < > 0.50* 0.43* 0.48* 0.52*  --  0.60*  --   --   --   CALCIUM 8.3*   < > 8.6* 8.8* 9.0 8.6*  --  9.0  --   --   --   MG 2.1  --   --   --   --   --   --   --   --   --   --  PHOS 3.4  --   --   --   --   --   --   --   --   --   --    < > = values in this interval not displayed.    Liver Function Tests: Recent Labs  Lab 08/11/19 0509 08/12/19 0420 08/13/19 0427 08/14/19 0438 08/15/19 0433  AST _0 ALT 44 38 33 33 29  ALKPHOS 37* 37* 43 46 46  BILITOT 0.7 0.8 0.6 0.9 0.9  PROT 4.3* 5.1* 5.1* 5.1* 5.1*  ALBUMIN 2.9* 3.7 3.4* 3.1* 2.9*   No results for input(s): LIPASE, AMYLASE in the last 168 hours. No results for input(s): AMMONIA in the last 168 hours.  CBC: Recent Labs  Lab 08/13/19 1639 08/14/19 0438 08/14/19 1520 08/15/19 0433 08/15/19 0446 08/15/19 1612 08/15/19 1618 08/15/19 2307 08/16/19 0501  WBC 8.8 8.4 7.6 7.8  --  7.3  --   --   --   HGB 8.8* 8.9* 9.3* 9.1* 8.8* 9.5* 10.2* 10.2* 9.2*  HCT 28.0* 28.5* 29.5* 29.5* 26.0* 30.8* 30.0* 30.0* 27.0*  MCV 97.6 97.3 96.7 98.7  --  99.0  --   --   --   PLT 58* 53* 53* 54*  --  52*  --   --   --     Cardiac Enzymes: No results for input(s): CKTOTAL, CKMB, CKMBINDEX, TROPONINI in the last 168 hours.  BNP: BNP (last 3 results) Recent  Labs    07/22/19 1039  BNP 15.3    ProBNP (last 3 results) No results for input(s): PROBNP in the last 8760 hours.    Other results:  Imaging: DG CHEST PORT 1 VIEW  Result Date: 08/16/2019 CLINICAL DATA:  COVID and history of ECMO EXAM: PORTABLE CHEST 1 VIEW COMPARISON:  Yesterday FINDINGS: Tracheostomy tube and feeding tube. Bilateral chest tubes. Large-bore cannula over the hepatic cava. Right upper extremity PICC with tip at the upper cavoatrial junction. Mild increase in aeration but still severely low volumes with dense consolidation. No visible pneumothorax. Stable enlarged heart IMPRESSION: 1. Stable hardware positioning. 2. Mild improvement in aeration. 3. No visible pneumothorax. Electronically Signed   By: Monte Fantasia M.D.   On: 08/16/2019 06:35   DG CHEST PORT 1 VIEW  Result Date: 08/15/2019 CLINICAL DATA:  ARDS, COVID positive 30 days prior, history of ECMO EXAM: PORTABLE CHEST 1 VIEW COMPARISON:  Radiograph 08/14/2019 FINDINGS: *Tracheostomy tube in the mid trachea. *Transesophageal tube tip below the GE junction, beyond the level of imaging. *Right upper extremity PICC terminates at the superior cavoatrial junction. *ECMO cannula in stable position. *Bilateral chest tubes in stable position. Persistent dense airspace opacity throughout the right lung with developing air bronchograms. Much of the left hemithorax is opacified as well with some residual aerated lung in the apex. No clear pneumothorax. Cardiomediastinal contours are obscured. No acute osseous or soft tissue abnormality. IMPRESSION: Persistent opacification of the entire right hemithorax with developing air bronchograms. Opacification the left hemithorax is similar to prior. Stable appearance of support devices. Electronically Signed   By: Lovena Le M.D.   On: 08/15/2019 06:33     Medications:     Scheduled Medications: . artificial tears  1 application Both Eyes Z0Y  . vitamin C  500 mg Per Tube Daily  .  chlorhexidine gluconate (MEDLINE KIT)  15 mL Mouth Rinse BID  . Chlorhexidine Gluconate Cloth  6 each Topical Daily  . dexamethasone (DECADRON) injection  1 mg Intravenous Q24H  . diazepam  5 mg Oral Q6H  . feeding supplement (PRO-STAT SUGAR FREE 64)  60 mL Per Tube BID  . free water  250 mL Per Tube Q8H  . gabapentin  300 mg Per Tube Q8H  . insulin aspart  3-9 Units Subcutaneous Q4H  . magnesium hydroxide  15 mL Oral Daily  . mouth rinse  15 mL Mouth Rinse 10 times per day  . morphine CONCENTRATE  10 mg Oral Q4H  . oxymetazoline  1 spray Each Nare BID  . pantoprazole (PROTONIX) IV  40 mg Intravenous QHS  . QUEtiapine  50 mg Per Tube BID  . senna-docusate  1 tablet Per Tube BID  . sodium chloride flush  10-40 mL Intracatheter Q12H  . sodium chloride flush  10-40 mL Intracatheter Q12H  . Thrombi-Pad  1 each Topical Once  . zinc sulfate  220 mg Per Tube Daily    Infusions: . sodium chloride    . sodium chloride 10 mL/hr at 08/16/19 0710  . albumin human Stopped (08/14/19 2056)  . ceFEPime (MAXIPIME) IV Stopped (08/16/19 0048)  . cisatracurium (NIMBEX) infusion 4.5 mcg/kg/min (08/16/19 0710)  . dextrose    . feeding supplement (PIVOT 1.5 CAL) 1,000 mL (08/15/19 1830)  . heparin 1,600 Units/hr (08/16/19 0710)  . HYDROmorphone 5 mg/hr (08/16/19 0710)  . midazolam (VERSED) infusion 24 mg/hr (08/16/19 0710)  . propofol (DIPRIVAN) infusion 35 mcg/kg/min (08/16/19 0710)  . vancomycin Stopped (08/16/19 0229)    PRN Medications: Place/Maintain arterial line **AND** sodium chloride, sodium chloride, acetaminophen, albumin human, dextrose, hydrALAZINE, HYDROmorphone, labetalol, metoprolol tartrate, midazolam, ondansetron (ZOFRAN) IV, sodium chloride, sodium chloride flush, sodium chloride flush, sodium chloride flush   Assessment/Plan:    1. Acute hypoxic/hypercarbic respiratory failure due to COVID PNA: Remained markedly hypoxic despite full support. Intubated 12/26. S/p bilateral CTs  12/26 for pneumothoraces.  TEE on 12/26 LVEF 70% RV ok. VV ECMO begun 12/26. Day #9 VV ECMO. Tracheostomy 12/29.  COVID ARDS at this point.  Flows stable and sats improved with deeper sedation and re-paralysis  - ECMO parameters stable but requiring sedation and paralytics - Volume elevated but continues to improve nearing euvolemia. Will give additional 20 IV lasix today and assess response. Support with albumin as needed - Given bleeding and air leak from trach discussed possible repeat bronch with CCM today. Will proceed with bronch and trach change out - Would hold off on CT today as lung u/s yesterday did not reveal significant effusions and I doubt CT will change management much at this point - Sats running high 80s -low 90s TVs 70 -> 100. Driving pressure 94-76 range. Continue to support with PC. May be able to increase pressure slightly  - Continue lung rest.  -  ECMO parameters stable.  - PLTs 53k this am. LDH pending   2. Bilateral PTX: Due to barotrauma.  - Driving pressure minimized at 12-13. Stable this am No change  3. COVID PNA: management of COVID infection per CCM. CXR with bilateral multifocal PNA progressing to likely ARDS.  - s/p 10 days of remdesivir - 12/16, 12/2 s/p tociluzimab - 12/17 s/p convalescent plasma - On decadron at 47m/daily until 1/11 - On cefepime and vanc - see below. Reviewed with PharmD again today. Continue course  4. ID: Empiric coverage with vancomycin/cefepime initially for possible secondary bacterial PNA, course completed.   - now back on cefepime and vanc for + cx with staph epidimidis, MSSA and serratia (sensitivities  ok). Discussed dosing with PharmD personally. - Remains stable  5. Thrombocytopenia: Slow decrease in platelets, likely low level hemolysis + critical illness.  - switched to bival on 1/4. HIT negative - 53k ths am.  - Bival off. Back on heparin. Dosing discussed with PharmD  6. Anemia: Bleeding at trach site resolved but  still bleeding at right chest tube site and CVL. - hgb stable at 9.2 - Transfuse hgb < 8   7. FEN - continue TFs  D/w with ECMO team and CCM at bedside on multidisciplinary rounds. Continue to await ARDS recovery. Details as above. Bronch with trach change out today. Watch deltaP. Another dose IV lasix. Can drrop FW a bit  Length of Stay: 25   CRITICAL CARE Performed by: Glori Bickers  Total critical care time: 40 minutes  Critical care time was exclusive of separately billable procedures and treating other patients.  Critical care was necessary to treat or prevent imminent or life-threatening deterioration.  Critical care was time spent personally by me on the following activities: development of treatment plan with patient and/or surrogate as well as nursing, discussions with consultants, evaluation of patient's response to treatment, examination of patient, obtaining history from patient or surrogate, ordering and performing treatments and interventions, ordering and review of laboratory studies, ordering and review of radiographic studies, pulse oximetry and re-evaluation of patient's condition.    Glori Bickers MD 08/16/2019, 7:29 AM  Advanced Heart Failure Team Pager [phone] (M-F; 7a - 4p)  Please contact [facility] for night-coverage after hours (4p -7a ) and weekends on [email or web address]

## 2019-08-17 ENCOUNTER — Inpatient Hospital Stay (HOSPITAL_COMMUNITY): Payer: Managed Care, Other (non HMO)

## 2019-08-17 LAB — CBC WITH DIFFERENTIAL/PLATELET
Abs Immature Granulocytes: 1.42 10*3/uL — ABNORMAL HIGH (ref 0.00–0.07)
Basophils Absolute: 0.1 10*3/uL (ref 0.0–0.1)
Basophils Relative: 1 %
Eosinophils Absolute: 0.3 10*3/uL (ref 0.0–0.5)
Eosinophils Relative: 4 %
HCT: 32.2 % — ABNORMAL LOW (ref 39.0–52.0)
Hemoglobin: 10.1 g/dL — ABNORMAL LOW (ref 13.0–17.0)
Immature Granulocytes: 17 %
Lymphocytes Relative: 20 %
Lymphs Abs: 1.7 10*3/uL (ref 0.7–4.0)
MCH: 30 pg (ref 26.0–34.0)
MCHC: 31.4 g/dL (ref 30.0–36.0)
MCV: 95.5 fL (ref 80.0–100.0)
Monocytes Absolute: 1.3 10*3/uL — ABNORMAL HIGH (ref 0.1–1.0)
Monocytes Relative: 16 %
Neutro Abs: 3.6 10*3/uL (ref 1.7–7.7)
Neutrophils Relative %: 42 %
Platelets: 48 10*3/uL — ABNORMAL LOW (ref 150–400)
RBC: 3.37 MIL/uL — ABNORMAL LOW (ref 4.22–5.81)
RDW: 19.8 % — ABNORMAL HIGH (ref 11.5–15.5)
WBC: 8.3 10*3/uL (ref 4.0–10.5)
nRBC: 1.2 % — ABNORMAL HIGH (ref 0.0–0.2)

## 2019-08-17 LAB — POCT I-STAT 7, (LYTES, BLD GAS, ICA,H+H)
Acid-Base Excess: 1 mmol/L (ref 0.0–2.0)
Acid-base deficit: 1 mmol/L (ref 0.0–2.0)
Acid-base deficit: 1 mmol/L (ref 0.0–2.0)
Acid-base deficit: 2 mmol/L (ref 0.0–2.0)
Acid-base deficit: 2 mmol/L (ref 0.0–2.0)
Bicarbonate: 23.4 mmol/L (ref 20.0–28.0)
Bicarbonate: 24.1 mmol/L (ref 20.0–28.0)
Bicarbonate: 24.6 mmol/L (ref 20.0–28.0)
Bicarbonate: 24.7 mmol/L (ref 20.0–28.0)
Bicarbonate: 24.8 mmol/L (ref 20.0–28.0)
Calcium, Ion: 1.25 mmol/L (ref 1.15–1.40)
Calcium, Ion: 1.26 mmol/L (ref 1.15–1.40)
Calcium, Ion: 1.29 mmol/L (ref 1.15–1.40)
Calcium, Ion: 1.29 mmol/L (ref 1.15–1.40)
Calcium, Ion: 1.29 mmol/L (ref 1.15–1.40)
HCT: 26 % — ABNORMAL LOW (ref 39.0–52.0)
HCT: 28 % — ABNORMAL LOW (ref 39.0–52.0)
HCT: 30 % — ABNORMAL LOW (ref 39.0–52.0)
HCT: 30 % — ABNORMAL LOW (ref 39.0–52.0)
HCT: 32 % — ABNORMAL LOW (ref 39.0–52.0)
Hemoglobin: 10.2 g/dL — ABNORMAL LOW (ref 13.0–17.0)
Hemoglobin: 10.2 g/dL — ABNORMAL LOW (ref 13.0–17.0)
Hemoglobin: 10.9 g/dL — ABNORMAL LOW (ref 13.0–17.0)
Hemoglobin: 8.8 g/dL — ABNORMAL LOW (ref 13.0–17.0)
Hemoglobin: 9.5 g/dL — ABNORMAL LOW (ref 13.0–17.0)
O2 Saturation: 87 %
O2 Saturation: 89 %
O2 Saturation: 90 %
O2 Saturation: 90 %
O2 Saturation: 91 %
Patient temperature: 36.5
Patient temperature: 36.6
Patient temperature: 36.7
Patient temperature: 36.7
Patient temperature: 97.7
Potassium: 3.8 mmol/L (ref 3.5–5.1)
Potassium: 4 mmol/L (ref 3.5–5.1)
Potassium: 4 mmol/L (ref 3.5–5.1)
Potassium: 4.5 mmol/L (ref 3.5–5.1)
Potassium: 4.6 mmol/L (ref 3.5–5.1)
Sodium: 144 mmol/L (ref 135–145)
Sodium: 144 mmol/L (ref 135–145)
Sodium: 145 mmol/L (ref 135–145)
Sodium: 147 mmol/L — ABNORMAL HIGH (ref 135–145)
Sodium: 148 mmol/L — ABNORMAL HIGH (ref 135–145)
TCO2: 25 mmol/L (ref 22–32)
TCO2: 25 mmol/L (ref 22–32)
TCO2: 26 mmol/L (ref 22–32)
TCO2: 26 mmol/L (ref 22–32)
TCO2: 26 mmol/L (ref 22–32)
pCO2 arterial: 36.7 mmHg (ref 32.0–48.0)
pCO2 arterial: 41.2 mmHg (ref 32.0–48.0)
pCO2 arterial: 42 mmHg (ref 32.0–48.0)
pCO2 arterial: 43 mmHg (ref 32.0–48.0)
pCO2 arterial: 43.8 mmHg (ref 32.0–48.0)
pH, Arterial: 7.346 — ABNORMAL LOW (ref 7.350–7.450)
pH, Arterial: 7.36 (ref 7.350–7.450)
pH, Arterial: 7.364 (ref 7.350–7.450)
pH, Arterial: 7.376 (ref 7.350–7.450)
pH, Arterial: 7.435 (ref 7.350–7.450)
pO2, Arterial: 55 mmHg — ABNORMAL LOW (ref 83.0–108.0)
pO2, Arterial: 57 mmHg — ABNORMAL LOW (ref 83.0–108.0)
pO2, Arterial: 58 mmHg — ABNORMAL LOW (ref 83.0–108.0)
pO2, Arterial: 61 mmHg — ABNORMAL LOW (ref 83.0–108.0)
pO2, Arterial: 62 mmHg — ABNORMAL LOW (ref 83.0–108.0)

## 2019-08-17 LAB — CBC
HCT: 30.6 % — ABNORMAL LOW (ref 39.0–52.0)
Hemoglobin: 9.4 g/dL — ABNORMAL LOW (ref 13.0–17.0)
MCH: 29.7 pg (ref 26.0–34.0)
MCHC: 30.7 g/dL (ref 30.0–36.0)
MCV: 96.5 fL (ref 80.0–100.0)
Platelets: 53 10*3/uL — ABNORMAL LOW (ref 150–400)
RBC: 3.17 MIL/uL — ABNORMAL LOW (ref 4.22–5.81)
RDW: 19.9 % — ABNORMAL HIGH (ref 11.5–15.5)
WBC: 6.7 10*3/uL (ref 4.0–10.5)
nRBC: 0.7 % — ABNORMAL HIGH (ref 0.0–0.2)

## 2019-08-17 LAB — GLUCOSE, CAPILLARY
Glucose-Capillary: 117 mg/dL — ABNORMAL HIGH (ref 70–99)
Glucose-Capillary: 138 mg/dL — ABNORMAL HIGH (ref 70–99)
Glucose-Capillary: 160 mg/dL — ABNORMAL HIGH (ref 70–99)
Glucose-Capillary: 200 mg/dL — ABNORMAL HIGH (ref 70–99)
Glucose-Capillary: 154 mg/dL — ABNORMAL HIGH (ref 70–99)

## 2019-08-17 LAB — BASIC METABOLIC PANEL
Anion gap: 10 (ref 5–15)
Anion gap: 9 (ref 5–15)
BUN: 39 mg/dL — ABNORMAL HIGH (ref 6–20)
BUN: 34 mg/dL — ABNORMAL HIGH (ref 6–20)
CO2: 23 mmol/L (ref 22–32)
CO2: 22 mmol/L (ref 22–32)
Calcium: 8.7 mg/dL — ABNORMAL LOW (ref 8.9–10.3)
Calcium: 8.5 mg/dL — ABNORMAL LOW (ref 8.9–10.3)
Chloride: 110 mmol/L (ref 98–111)
Chloride: 113 mmol/L — ABNORMAL HIGH (ref 98–111)
Creatinine, Ser: 0.5 mg/dL — ABNORMAL LOW (ref 0.61–1.24)
Creatinine, Ser: 0.59 mg/dL — ABNORMAL LOW (ref 0.61–1.24)
GFR calc Af Amer: >60 mL/min (ref >60)
GFR calc Af Amer: >60 mL/min (ref >60)
GFR calc non Af Amer: >60 mL/min (ref >60)
GFR calc non Af Amer: >60 mL/min (ref >60)
Glucose, Bld: 125 mg/dL — ABNORMAL HIGH (ref 70–99)
Glucose, Bld: 205 mg/dL — ABNORMAL HIGH (ref 70–99)
Potassium: 4 mmol/L (ref 3.5–5.1)
Potassium: 4.5 mmol/L (ref 3.5–5.1)
Sodium: 143 mmol/L (ref 135–145)
Sodium: 144 mmol/L (ref 135–145)

## 2019-08-17 LAB — COOXEMETRY PANEL
Carboxyhemoglobin: 3.7 % — ABNORMAL HIGH (ref 0.5–1.5)
Methemoglobin: 1 % (ref 0.0–1.5)
O2 Saturation: 86 %
Total hemoglobin: 13.3 g/dL (ref 12.0–16.0)

## 2019-08-17 LAB — HEPATIC FUNCTION PANEL
ALT: 40 U/L (ref 0–44)
AST: 21 U/L (ref 15–41)
Albumin: 3.2 g/dL — ABNORMAL LOW (ref 3.5–5.0)
Alkaline Phosphatase: 52 U/L (ref 38–126)
Bilirubin, Direct: 0.1 mg/dL (ref 0.0–0.2)
Indirect Bilirubin: 0.7 mg/dL (ref 0.3–0.9)
Total Bilirubin: 0.8 mg/dL (ref 0.3–1.2)
Total Protein: 5.4 g/dL — ABNORMAL LOW (ref 6.5–8.1)

## 2019-08-17 LAB — PROTIME-INR
INR: 1.1 (ref 0.8–1.2)
Prothrombin Time: 14.2 seconds (ref 11.4–15.2)

## 2019-08-17 LAB — FIBRINOGEN: Fibrinogen: 463 mg/dL (ref 210–475)

## 2019-08-17 LAB — TRIGLYCERIDES: Triglycerides: 202 mg/dL — ABNORMAL HIGH (ref <150)

## 2019-08-17 LAB — VANCOMYCIN, TROUGH: Vancomycin Tr: 20 ug/mL (ref 15–20)

## 2019-08-17 LAB — HEPARIN LEVEL (UNFRACTIONATED)
Heparin Unfractionated: 0.27 IU/mL — ABNORMAL LOW (ref 0.30–0.70)
Heparin Unfractionated: 0.27 IU/mL — ABNORMAL LOW (ref 0.30–0.70)
Heparin Unfractionated: 0.42 IU/mL (ref 0.30–0.70)

## 2019-08-17 LAB — LACTATE DEHYDROGENASE: LDH: 402 U/L — ABNORMAL HIGH (ref 98–192)

## 2019-08-17 LAB — APTT: aPTT: 80 seconds — ABNORMAL HIGH (ref 24–36)

## 2019-08-17 LAB — LACTIC ACID, PLASMA: Lactic Acid, Venous: 1.6 mmol/L (ref 0.5–1.9)

## 2019-08-17 MED ORDER — HYDROMORPHONE HCL 1 MG/ML PO LIQD
8.0000 mg | ORAL | Status: DC
  Administered 2019-08-17 (×2): 8 mg
  Filled 2019-08-17 (×3): qty 8

## 2019-08-17 MED ORDER — DIAZEPAM 1 MG/ML PO SOLN
20.0000 mg | Freq: Four times a day (QID) | ORAL | Status: DC
  Administered 2019-08-17 – 2019-08-18 (×3): 20 mg
  Filled 2019-08-17 (×3): qty 20

## 2019-08-17 MED ORDER — PROPRANOLOL HCL 20 MG/5ML PO SOLN: 20.0000 mg | Freq: Three times a day (TID) | ORAL | Status: DC

## 2019-08-17 MED ORDER — ACETAMINOPHEN 160 MG/5ML PO SOLN: 650.0000 mg | Freq: Four times a day (QID) | ORAL | Status: DC | PRN

## 2019-08-17 MED ORDER — HYDROMORPHONE HCL 2 MG PO TABS
8.0000 mg | ORAL_TABLET | ORAL | Status: DC
  Administered 2019-08-17 – 2019-09-04 (×99): 8 mg
  Filled 2019-08-17 (×102): qty 4

## 2019-08-17 NOTE — Progress Notes (Signed)
ANTICOAGULATION CONSULT NOTE - Follow Up Consult  Pharmacy Consult for heparin Indication: ECMO  Labs: Recent Labs    08/15/19 0433 08/15/19 0446 08/16/19 0453 08/16/19 1317 08/16/19 1657 08/16/19 2300 08/17/19 0006 08/17/19 0400 08/17/19 0407  HGB 9.1*  --  9.1* 8.8* 9.7* 10.2* 9.5* 10.1* 10.2*  HCT 29.5*  --  29.9* 29.5* 30.9* 30.0* 28.0* 32.2* 30.0*  PLT 54*   < > 47* 41* 43*  --   --  48*  --   APTT 66*  --   --   --  121* 95*  --  80*  --   LABPROT 14.3  --  14.4  --   --   --   --  14.2  --   INR 1.1  --  1.1  --   --   --   --  1.1  --   HEPARINUNFRC 0.25*   < > 0.32  --  0.58 0.46  --  0.27*  --   CREATININE 0.52*   < > 0.48*  --  0.53*  --   --  0.50*  --    < > = values in this interval not displayed.    Assessment: 44yo male now subtherapeutic on heparin after one level at goal; no gtt issues though RN reports some minor epistaxis.  Goal of Therapy:  Heparin level 0.3-0.5 units/ml   Plan:  Will increase heparin gtt slightly to 1550 units/hr and check level in 6 hours.    Wynona Neat, PharmD, BCPS  08/17/2019,6:29 AM

## 2019-08-17 NOTE — Progress Notes (Signed)
ANTICOAGULATION CONSULT NOTE - Follow-up Pharmacy Consult for heparin Indication: ECMO  No Known Allergies  Patient Measurements: Height: 5' 8"  (172.7 cm) Weight: 281 lb 12 oz (127.8 kg) IBW/kg (Calculated) : 68.4 Heparin Dosing Weight: 100.7 kg  Vital Signs: Temp: 97.9 F (36.6 C) (01/09 0000) Temp Source: Other (Comment) (01/08 2000) BP: 144/57 (01/08 1415) Pulse Rate: 82 (01/09 0000)  Labs: Recent Labs    08/14/19 0438 08/14/19 0441 08/15/19 0433 08/15/19 0446 08/15/19 1612 08/15/19 1618 08/16/19 0453 08/16/19 1317 08/16/19 1657 08/16/19 1954 08/16/19 2300 08/17/19 0006  HGB 8.9*  --  9.1*  --  9.5*  --  9.1* 8.8* 9.7* 9.9* 10.2* 9.5*  HCT 28.5*  --  29.5*  --  30.8*  --  29.9* 29.5* 30.9* 29.0* 30.0* 28.0*  PLT 53*   < > 54*  --  52*  --  47* 41* 43*  --   --   --   APTT 56*   < > 66*  --   --   --   --   --  121*  --  95*  --   LABPROT 16.9*  --  14.3  --   --   --  14.4  --   --   --   --   --   INR 1.4*  --  1.1  --   --   --  1.1  --   --   --   --   --   HEPARINUNFRC  --    < > 0.25*   < >  --    < > 0.32  --  0.58  --  0.46  --   CREATININE 0.43*   < > 0.52*  --  0.60*  --  0.48*  --  0.53*  --   --   --    < > = values in this interval not displayed.    Estimated Creatinine Clearance: 152.1 mL/min (A) (by C-G formula based on SCr of 0.53 mg/dL (L)).  Assessment: 44 yo male known COVID+ and ARDS VV ECMO. Heparin was restarted 1/6 (bivalirudin stopped as HIT test was negative).  Heparin level is 0.46 and aPTT 95.  Both within / near goal.  No changes needed.     Goal of Therapy:  Heparin level: 0.3-0.5 APTT goal: 66-85 sec Monitor platelets by anticoagulation protocol: Yes   Plan:  -Continue IV heparin at 1500 units/hr. -Next aPTT/Heparin level at 0500 with morning labs. -Monitor s/sx of bleeding  [employer], Pharm.D., BCPS Clinical Pharmacist  **Pharmacist phone directory can be found on [email or web address] listed under [place].  08/17/2019  12:13 AM

## 2019-08-17 NOTE — Progress Notes (Signed)
   Palliative Medicine Inpatient Follow Up Note   HPI: 44 y/o M admitted 12/14 with COVIDpneumonia causing acutehypoxemic respiratory failure. Developed pneumomediastinum 12/23 with concern for possible bilateral small pneumothoraces on 12/24. PCCM consulted for evaluation of pneumothoraces.  S/P Bronchoscopy yesterday S/P tracheostomy tube exchange yesterday  Today's Discussion (08/17/2019): Reviewed chart. Evaluated patient at bedside. Medical team, Dr. Lynetta Mare and nursing team all present during this time. Patient stable as of presently. Continue to allow lung rest on VV ECMO, as discussed in prior days it will take time.  Called patients wife, [name] to provide a daily update. She said that she understands at this point that it's just a waiting game. We talked about how [name] was doing presently. She shared that she continues to pray for him. Offered for her to let staff know when she would be interested in [place] so that we may properly arrange for this.  Will continue to communicate with family incrementally.  Time In: 0800 Time Out: 0815 Time Spent: 15 Greater than 50% of the time was spent in counseling and coordination of care ______________________________________________________________________________________ [place] Team Team Cell Phone: [phone] Please utilize secure chat with additional questions, if there is no response within 30 minutes please call the above phone number  Palliative Medicine Team providers are available by phone from 7am to 7pm daily and can be reached through the team cell phone.  Should this patient require assistance outside of these hours, please call the patient's attending physician.

## 2019-08-17 NOTE — Progress Notes (Signed)
Pharmacy Antibiotic Note  [name] is a 44 y.o. male on ECMO. Pharmacy dosing cefepime and vancomycin for PNA  -WBC= wnl, afebrile, SCr= 0.5 -BAL cultures with staph aureus (pan-S) and serratia marc -vancomycin trough= 20 on current vancomycin dose  1255m q8h  Plan: Continue vancomycin [id number] - stop date in for 1/4 Cefepime 2gm q8h - stop in for 1/14 This will complete 10 days of therapy F/u most recent cx data -Will follow renal function, cultures and clinical progress   Height: _0  (172.7 cm) Weight: 282 lb 3 oz (128 kg) IBW/kg (Calculated) : 68.4  Temp (24hrs), Avg:97.8 F (36.6 C), Min:97.3 F (36.3 C), Max:98.4 F (36.9 C)  Recent Labs  Lab 08/13/19 0427 08/14/19 0438 08/14/19 1520 08/15/19 0433 08/15/19 1612 08/16/19 0453 08/16/19 1317 08/16/19 1657 08/17/19 0400 08/17/19 0817  WBC 8.5 8.4 7.6 7.8 7.3 7.7 7.3 7.5 8.3  --   CREATININE 0.53* 0.43* 0.48* 0.52* 0.60* 0.48*  --  0.53* 0.50*  --   LATICACIDVEN 1.9 1.5  --  1.2  --  1.3  --   --  1.6  --   VANCOTROUGH  --   --  18  --   --   --   --   --   --  20    Estimated Creatinine Clearance: 152.1 mL/min (A) (by C-G formula based on SCr of 0.5 mg/dL (L)).    No Known Allergies  Antimicrobials this admission: Remdesivir 12/14>>12/23 Actemra x1 12/16; 12/22 Plasma 12/17 Vanco 12/26 >>12/28, 1/4>> Cefepime 12/26 >>12/30, 1/4>>  Dose adjustments this admission:   Microbiology results: 12/14 BCx: ngF 12/14 sputum: Normal respiratory flora  12/25 MRSA PCR neg  12/28 trach: few MSSA 12/28 BCx: ngF 1/2 bronch: >100 k serratia marcescens (R cefazolin), >100 k staph epidermidis (S vanc, tetra) 1/4 BAL: MSSA, serratia marcescens (R cefazolin)  LiBonnita Nasutiharm.D. CPP, BCPS Clinical Pharmacist [phone]/04/2020 1:36 PM    **Pharmacist phone directory can now be found on [email or web address] ([email or web address]).  Listed under [email or web address]

## 2019-08-17 NOTE — Progress Notes (Signed)
Patient ID: [name], male   DOB: January 22, 1976, 44 y.o.   MRN: [id number]    Advanced Heart Failure Rounding Note   Subjective:    Remains on VV ecmo with good flows. Remains sedated and paralyzed.   Circuit changed and trach exchanged on 1/8. Got 2u RBCs.  Now on NE 2 for BP support. Still with some oozing around trach. Minimal bloody secretions today. Bronch yesterday unremarkable.   PCO2 up overnight and sweep gas increased.   Remains on heparin.   Co-ox 86%  ABG 7.43/37/58/91%  CXR: Bilateral lung opacities c/w ARDS improving. Personally reviewed   On vent 50% PEEP 12. Driving pressure 12 No change. TV ~ 70-100 on PC   ECMO parameters Flow 5.4 RPM 3700 dP 32 Pressure -113  Objective:   Weight Range:  Vital Signs:   Temp:  [97.7 F (36.5 C)-98.4 F (36.9 C)] 97.9 F (36.6 C) (01/09 0400) Pulse Rate:  [66-88] 74 (01/09 0700) Resp:  [12-87] 20 (01/09 0700) BP: (144)/(57) 144/57 (01/08 1415) SpO2:  [82 %-95 %] 94 % (01/09 0841) Arterial Line BP: (113-160)/(45-71) 127/57 (01/09 0700) FiO2 (%):  [50 %-100 %] 50 % (01/09 0841) Weight:  [128 kg] 128 kg (01/09 0328) Last BM Date: 08/16/19  Weight change: Filed Weights   08/15/19 0500 08/16/19 0500 08/17/19 0328  Weight: 129.9 kg 127.8 kg 128 kg    Intake/Output:   Intake/Output Summary (Last 24 hours) at 08/17/2019 0912 Last data filed at 08/17/2019 0700 Gross per 24 hour  Intake 6780.39 ml  Output 5991 ml  Net 789.39 ml     Physical Exam: General:  Sedated on vent HEENT: normal + nasal packing Neck: supple. Hard to see JVP. +mild ooze Carotids 2+ bilat; no bruits. No lymphadenopathy or thryomegaly appreciated. Cor: PMI nondisplaced. Regular rate & rhythm. No rubs, gallops or murmurs. Lungs: coarse. Minimal air movement  Abdomen: obese soft, nontender, nondistended. No hepatosplenomegaly. No bruits or masses. Good bowel sounds. Extremities: no cyanosis, clubbing, rash, 1+ edema femoral cannulas ok   Neuro: sedated/paralyzed on vent   Telemetry: Sinus 70-80s Personally reviewed  Labs: Basic Metabolic Panel: Recent Labs  Lab 08/15/19 0433 08/15/19 1612 08/16/19 0453 08/16/19 1657 08/16/19 1954 08/16/19 2300 08/17/19 0006 08/17/19 0400 08/17/19 0407  NA 146* 143 142 142 144 145 144 143 144  K 3.8 4.2 3.7 4.1 4.1 4.0 4.0 4.0 4.0  CL 113* 110 107 110  --   --   --  110  --   CO2 _0 --   --   --  23  --   GLUCOSE 126* 150* 114* 195*  --   --   --  125*  --   BUN 28* 30* 35* 41*  --   --   --  39*  --   CREATININE 0.52* 0.60* 0.48* 0.53*  --   --   --  0.50*  --   CALCIUM 8.6* 9.0 8.8* 8.4*  --   --   --  8.7*  --     Liver Function Tests: Recent Labs  Lab 08/13/19 0427 08/14/19 0438 08/15/19 0433 08/16/19 0453 08/17/19 0400  AST _1 ALT 33 33 29 24 40  ALKPHOS 43 46 46 47 52  BILITOT 0.6 0.9 0.9 1.0 0.8  PROT 5.1* 5.1* 5.1* 5.2* 5.4*  ALBUMIN 3.4* 3.1* 2.9* 3.0* 3.2*   No results for input(s): LIPASE, AMYLASE in the last 168 hours.  No results for input(s): AMMONIA in the last 168 hours.  CBC: Recent Labs  Lab 08/15/19 1612 08/16/19 0453 08/16/19 1317 08/16/19 1657 08/16/19 1954 08/16/19 2300 08/17/19 0006 08/17/19 0400 08/17/19 0407  WBC 7.3 7.7 7.3 7.5  --   --   --  8.3  --   NEUTROABS  --   --  3.9  --   --   --   --  3.6  --   HGB 9.5* 9.1* 8.8* 9.7* 9.9* 10.2* 9.5* 10.1* 10.2*  HCT 30.8* 29.9* 29.5* 30.9* 29.0* 30.0* 28.0* 32.2* 30.0*  MCV 99.0 100.0 101.0* 94.8  --   --   --  95.5  --   PLT 52* 47* 41* 43*  --   --   --  48*  --     Cardiac Enzymes: No results for input(s): CKTOTAL, CKMB, CKMBINDEX, TROPONINI in the last 168 hours.  BNP: BNP (last 3 results) Recent Labs    07/22/19 1039  BNP 15.3    ProBNP (last 3 results) No results for input(s): PROBNP in the last 8760 hours.    Other results:  Imaging: DG CHEST PORT 1 VIEW  Result Date: 08/16/2019 CLINICAL DATA:  COVID and history of ECMO EXAM:  PORTABLE CHEST 1 VIEW COMPARISON:  Yesterday FINDINGS: Tracheostomy tube and feeding tube. Bilateral chest tubes. Large-bore cannula over the hepatic cava. Right upper extremity PICC with tip at the upper cavoatrial junction. Mild increase in aeration but still severely low volumes with dense consolidation. No visible pneumothorax. Stable enlarged heart IMPRESSION: 1. Stable hardware positioning. 2. Mild improvement in aeration. 3. No visible pneumothorax. Electronically Signed   By: Monte Fantasia M.D.   On: 08/16/2019 06:35     Medications:     Scheduled Medications: . artificial tears  1 application Both Eyes D6U  . vitamin C  500 mg Per Tube Daily  . chlorhexidine gluconate (MEDLINE KIT)  15 mL Mouth Rinse BID  . Chlorhexidine Gluconate Cloth  6 each Topical Daily  . dexamethasone (DECADRON) injection  1 mg Intravenous Q24H  . diazepam  10 mg Per Tube Q6H  . feeding supplement (PRO-STAT SUGAR FREE 64)  60 mL Per Tube BID  . free water  200 mL Per Tube Q8H  . gabapentin  300 mg Per Tube Q8H  . HYDROmorphone HCl  2 mg Per Tube Q4H  . insulin aspart  3-9 Units Subcutaneous Q4H  . mouth rinse  15 mL Mouth Rinse 10 times per day  . oxymetazoline  1 spray Each Nare BID  . pantoprazole sodium  40 mg Per Tube QHS  . QUEtiapine  100 mg Per Tube BID  . sodium chloride flush  10-40 mL Intracatheter Q12H  . sodium chloride flush  10-40 mL Intracatheter Q12H  . Thrombi-Pad  1 each Topical Once  . zinc sulfate  220 mg Per Tube Daily    Infusions: . sodium chloride    . sodium chloride 10 mL/hr at 08/17/19 0700  . albumin human 60 mL/hr at 08/16/19 1900  . ceFEPime (MAXIPIME) IV 2 g (08/17/19 0851)  . cisatracurium (NIMBEX) infusion 5 mcg/kg/min (08/17/19 0700)  . dextrose    . feeding supplement (PIVOT 1.5 CAL) 1,000 mL (08/17/19 0344)  . heparin 1,550 Units/hr (08/17/19 0800)  . HYDROmorphone 5 mg/hr (08/17/19 0700)  . midazolam (VERSED) infusion 24 mg/hr (08/17/19 0700)  .  norepinephrine (LEVOPHED) Adult infusion 2 mcg/min (08/17/19 0700)  . propofol (DIPRIVAN) infusion 50 mcg/kg/min (08/17/19 0700)  .  vancomycin Stopped (08/17/19 0139)    PRN Medications: Place/Maintain arterial line **AND** sodium chloride, sodium chloride, acetaminophen, albumin human, dextrose, hydrALAZINE, HYDROmorphone, labetalol, magnesium hydroxide, metoprolol tartrate, midazolam, ondansetron (ZOFRAN) IV, senna-docusate, sodium chloride, sodium chloride flush, sodium chloride flush, sodium chloride flush   Assessment/Plan:    1. Acute hypoxic/hypercarbic respiratory failure due to COVID PNA: Remained markedly hypoxic despite full support. Intubated 12/26. S/p bilateral CTs 12/26 for pneumothoraces.  TEE on 12/26 LVEF 70% RV ok. VV ECMO begun 12/26. Day #9 VV ECMO. Tracheostomy 12/29.  COVID ARDS at this point.  Flows stable and sats improved with deeper sedation and re-paralysis. Ecmo circuit changed 1/8 - ECMO parameters stable  - Volume status improved. Will hold lasix today. Give another dose tomorrow - Bronch 1/8. Minimal secretions - Sats running low 90s TVs 70 -> 100. Driving pressure 14-48 range. Continue to support with PC. CXR mildly improved. Continue PS. Suspect TVs will slowly trend up as lung complaince improves - Continue lung rest.  - PLTs 48k this am. LDH 402   2. Bilateral PTX: Due to barotrauma.  - Driving pressure minimized at 12-13. Stable this am No change  3. COVID PNA: management of COVID infection per CCM. CXR with bilateral multifocal PNA progressing to likely ARDS.  - s/p 10 days of remdesivir - 12/16, 12/2 s/p tociluzimab - 12/17 s/p convalescent plasma - On decadron at 37m/daily until 1/11 - On cefepime and vanc - see below. Reviewed with PharmD again today. Continue course  4. ID: Empiric coverage with vancomycin/cefepime initially for possible secondary bacterial PNA, course completed.   - now back on cefepime and vanc for + cx with staph  epidimidis, MSSA and serratia (sensitivities ok).  - Remains stable - Reviewed with PharmD  5. Thrombocytopenia: Slow decrease in platelets, likely low level hemolysis + critical illness.  - switched to bival on 1/4. HIT negative - 48k ths am.  - Back on heparin. Heparin level low this am. Dosing discussed with PharmD  6. Anemia: Bleeding at trach site resolved but still bleeding at right chest tube site and CVL. - hgb stable at 10.2 - Transfuse hgb < 8   7. FEN - continue TFs  D/w with ECMO team and CCM at bedside on multidisciplinary rounds. Continue to await ARDS recovery. Details as above.  Length of Stay: 26   CRITICAL CARE Performed by: DGlori Bickers Total critical care time: 40 minutes  Critical care time was exclusive of separately billable procedures and treating other patients.  Critical care was necessary to treat or prevent imminent or life-threatening deterioration.  Critical care was time spent personally by me on the following activities: development of treatment plan with patient and/or surrogate as well as nursing, discussions with consultants, evaluation of patient's response to treatment, examination of patient, obtaining history from patient or surrogate, ordering and performing treatments and interventions, ordering and review of laboratory studies, ordering and review of radiographic studies, pulse oximetry and re-evaluation of patient's condition.    DGlori BickersMD 08/17/2019, 9:12 AM  Advanced Heart Failure Team Pager [phone](M-F; 7[place]  Please contact [facility] for night-coverage after hours (4p -7a ) and weekends on [email or web address]

## 2019-08-17 NOTE — Progress Notes (Signed)
ANTICOAGULATION CONSULT NOTE  Pharmacy Consult for heparin Indication: ECMO  No Known Allergies  Patient Measurements: Height: 5' 8"  (172.7 cm) Weight: 282 lb 3 oz (128 kg) IBW/kg (Calculated) : 68.4 Heparin Dosing Weight: 100.7 kg  Vital Signs: Temp: 97.5 F (36.4 C) (01/09 1600) Pulse Rate: 75 (01/09 1800)  Labs: Recent Labs    08/15/19 0433 08/15/19 0446 08/16/19 0453 08/16/19 0501 08/16/19 1657 08/16/19 2300 08/17/19 0400 08/17/19 0911 08/17/19 1230 08/17/19 1605 08/17/19 1607  HGB 9.1*  --  9.1*  --  9.7* 10.2* 10.1* 10.9*  --  10.2* 9.4*  HCT 29.5*  --  29.9*  --  30.9* 30.0* 32.2* 32.0*  --  30.0* 30.6*  PLT 54*   < > 47*   < > 43*  --  48*  --   --   --  53*  APTT 66*  --   --   --  121* 95* 80*  --   --   --   --   LABPROT 14.3  --  14.4  --   --   --  14.2  --   --   --   --   INR 1.1  --  1.1  --   --   --  1.1  --   --   --   --   HEPARINUNFRC 0.25*   < > 0.32  --  0.58 0.46 0.27*  --  0.27*  --  0.42  CREATININE 0.52*   < > 0.48*  --  0.53*  --  0.50*  --   --   --  0.59*   < > = values in this interval not displayed.    Estimated Creatinine Clearance: 152.1 mL/min (A) (by C-G formula based on SCr of 0.59 mg/dL (L)).  Assessment: 44 yo male known COVID+ and ARDS VV ECMO. Heparin was restarted today (bivalirudin stopped as HIT test was negative).   h/h stable, pltc l;ow stable 40-50s LDH 300s> 466 >400. Fibrinogen 443. Still a little oozing from nose and trach site but improved from previous  Heparin drip 1550 uts/hr HL 0.27 ( aiming for upper end of range after circuit change 1/8)   PM follow-up > heparin level 0.42; some bleeding from nose per RN.   Goal of Therapy:  Heparin level: 0.3-0.5 APTT goal: 66-85 sec Monitor platelets by anticoagulation protocol: Yes   Plan:  -Continue IV heparin 1700 units/hr. -Recheck aPTT/Heparin level in 6 hrs. -Monitor s/sx of bleeding   [name], [facility] Clinical Pharmacist Phone [phone]  08/17/2019 6:52 PM

## 2019-08-17 NOTE — Progress Notes (Signed)
ANTICOAGULATION CONSULT NOTE  Pharmacy Consult for heparin Indication: ECMO  No Known Allergies  Patient Measurements: Height: 5' 8"  (172.7 cm) Weight: 282 lb 3 oz (128 kg) IBW/kg (Calculated) : 68.4 Heparin Dosing Weight: 100.7 kg  Vital Signs: Temp: 97.3 F (36.3 C) (01/09 1230) Pulse Rate: 73 (01/09 1230)  Labs: Recent Labs    08/15/19 0433 08/15/19 0446 08/16/19 0453 08/16/19 1317 08/16/19 1657 08/16/19 2300 08/17/19 0400 08/17/19 0407 08/17/19 0911 08/17/19 1230  HGB 9.1*  --  9.1* 8.8* 9.7* 10.2* 10.1* 10.2* 10.9*  --   HCT 29.5*  --  29.9* 29.5* 30.9* 30.0* 32.2* 30.0* 32.0*  --   PLT 54*   < > 47* 41* 43*  --  48*  --   --   --   APTT 66*  --   --   --  121* 95* 80*  --   --   --   LABPROT 14.3  --  14.4  --   --   --  14.2  --   --   --   INR 1.1  --  1.1  --   --   --  1.1  --   --   --   HEPARINUNFRC 0.25*   < > 0.32  --  0.58 0.46 0.27*  --   --  0.27*  CREATININE 0.52*   < > 0.48*  --  0.53*  --  0.50*  --   --   --    < > = values in this interval not displayed.    Estimated Creatinine Clearance: 152.1 mL/min (A) (by C-G formula based on SCr of 0.5 mg/dL (L)).  Assessment: 44 yo male known COVID+ and ARDS VV ECMO. Heparin was restarted today (bivalirudin stopped as HIT test was negative).   h/h stable, pltc l;ow stable 40-50s LDH 300s> 466 >400. Fibrinogen 443. Still a little oozing from nose and trach site but improved from previous  Heparin drip 1550 uts/hr HL 0.27 ( aiming for upper end of range after circuit change 1/8)     Goal of Therapy:  Heparin level: 0.3-0.5 APTT goal: 66-85 sec Monitor platelets by anticoagulation protocol: Yes   Plan:  -Increase IV heparin to 1700 units/hr. -Recheck aPTT/Heparin level in 6 hrs. -Monitor s/sx of bleeding   Bonnita Nasuti Pharm.D. CPP, BCPS Clinical Pharmacist [phone] 08/17/2019 1:34 PM

## 2019-08-17 NOTE — Progress Notes (Signed)
Versed 77m and Dilaudid 25 ml wasted in SteriCycle.  Witnessed by MDillon BjorkRN

## 2019-08-17 NOTE — Progress Notes (Addendum)
NAME:  [name], MRN:  [id number], DOB:  May 27, 1976, LOS: 75 ADMISSION DATE:  07/22/2019, CONSULTATION DATE:  12/24 REFERRING MD:  Sloan Leiter, CHIEF COMPLAINT:  Dyspnea   Brief History   44 y/o M admitted 12/14 with COVID pneumonia causing acute hypoxemic respiratory failure. Developed pneumomediastinum 12/23 with concern for possible bilateral small pneumothoraces on 12/24.  PCCM consulted for evaluation of pneumothoraces  Past Medical History  GERD HTN Asthma  Significant Hospital Events   12/14 admit with hypoxemic respiratory failure in setting of COVID-19 pneumonia 12/23 noted to have pneumomediastinum on chest x-ray 12/24 PCCM consulted for evaluation 12/26 worsening hypoxemia, intubated  12/27 VV fem/fem ECMO cannulation 12/28 ETT switch 12/29 Tracheostomy 12/31 Oozing from multiple sites, H/H drop. Heparin held briefly and lidocaine with epi injected around trach site, Cryo, DDVAP given 1/3 CVL being discontinued  1/5: repacked nares  Consults:  PCCM  Procedures:  1/4 bronch: bal performed  Significant Diagnostic Tests:  CT chest 12/22 >> extensive pneumomediastinum, multifocal patchy bilateral groundglass opacities  Micro Data:  BCx2 12/14 >> negative  Sputum 12/15 >> negative  1/4 bronch:  1/2 acid fast smear bronch: negative 1/2 fungal cx bronch: pending  Antimicrobials:  Received remdesivir, steroids, actemra and convalescent plasma  5 days cefepime 12/24-12/29 Cefepime 1/4-> vanc 1/4->   Interim history/subjective:  Circuit change yesterday without incident for decreasing oxygenation efficiency.   Objective   Blood pressure (!) 144/57, pulse 74, temperature 97.9 F (36.6 C), resp. rate 20, height _0  (1.727 m), weight 128 kg, SpO2 95 %.    Vent Mode: PCV FiO2 (%):  [40 %-100 %] 100 % Set Rate:  [15 bmp-20 bmp] 20 bmp PEEP:  [12 cmH20] 12 cmH20 Plateau Pressure:  [18 cmH20-26 cmH20] 26 cmH20   Intake/Output Summary (Last 24 hours) at  08/17/2019 2263 Last data filed at 08/17/2019 0700 Gross per 24 hour  Intake 7234.03 ml  Output 6321 ml  Net 913.03 ml   Filed Weights   08/15/19 0500 08/16/19 0500 08/17/19 0328  Weight: 129.9 kg 127.8 kg 128 kg   Examination GEN: Critically ill appearing middle aged male. Sedated and chemically paralyzed, trach/vent, on VV ECMO. Moderate obesity HEENT: [place]. Nasal packing with serosang drainage noted Trach intact without bleeding. Pink mmm.  CV: RRR. 2+ radial pulses. No rgm.  PULM: Minimal chest expansion with very low tidal volumes. Expansion is symmetrical. Bronchial breath sounds throughout. Chest tubes with serosanguinous output.  R with small air-leak. Vt 90 with driving pressure of 90 GI: Obese, soft, active bowel sounds EXT: Symmetrical bulk and tone without cyanosis or clubbing. Bilateral femoral cannulas, insertion sites are clean and dry without bleeding.  NEURO: Sedated and chemically paralyzed. PERR.  SKIN: Pale, cool, clean, dry. Few small, round, pustular eruptions across chest.   Resolved Hospital Problem list    Assessment & Plan:    Critically ill due to acute hypoxemic respiratory failure with ARDS secondary to COVID-19, s/p tracheostomy requiring mechanical ventilation and VV ECMO support. Continue ECMO at current flow rate.  Bronchoscopy for secretion clearance as needed Continue to follow Vt on PC. Keep Driving pressure 15 of less. Hold diurese for today.  Continues to desaturate with minimal stimulation.  Increase enteral sedation.  Continue sedative infusions and paralytic.  Staph epi and serratia marcescens pneumonia:  cefepime and vancomycin for 7 days  Bilateral pneumothoraces and pneumomediastinum R with some leak (likely where loss of tv is) Continue suction  -20cmH2O  Posterior epistaxis.  repacked  1/5 after discussion with ENT  Continue antibiotics (on for above pna) while packing in place for TSS prevention (ceftriaxone otherwise)   Daily Goals  Checklist  Pain/Anxiety/Delirium protocol (if indicated): morphine and valium seroquel enterally. Dilaudid, propofol, versed infusions. Increase enteral agents to decrease drip dependence. Convert morphine to dilaudid due to flushing.  VAP protocol (if indicated): Bundle in place. Respiratory support goals: Continue PCV with ultra-lung protective ventilation  Blood pressure target: On NE to keep MAP>65  DVT prophylaxis: on systemic anticoagulation on VV-ECMO -  Nutrition Status: Nutrition Problem: Increased nutrient needs Etiology: acute illness(COVID PNA) Signs/Symptoms: estimated needs Interventions: Tube feeding, Prostat GI prophylaxis: Protonix per tube. Fluid status goals: diuresis to achieve - 1 to -1.5. (still net even by I/O) Urinary catheter: Guide hemodynamic management Glucose control: euglycemic on SSI. Mobility/therapy needs: Bedrest.  Antibiotic de-escalation: complete 7 days of antibiotics for pneumonia. Home medication reconciliation: reviewed. Daily labs: as per ECMO protocol. Code Status: Full code  Family Communication: Wife updated by nurse. Disposition: ICU   Labs   CBC: Recent Labs  Lab 08/15/19 1612 08/16/19 0453 08/16/19 1317 08/16/19 1657 08/16/19 1954 08/16/19 2300 08/17/19 0006 08/17/19 0400 08/17/19 0407  WBC 7.3 7.7 7.3 7.5  --   --   --  8.3  --   NEUTROABS  --   --  3.9  --   --   --   --  3.6  --   HGB 9.5* 9.1* 8.8* 9.7* 9.9* 10.2* 9.5* 10.1* 10.2*  HCT 30.8* 29.9* 29.5* 30.9* 29.0* 30.0* 28.0* 32.2* 30.0*  MCV 99.0 100.0 101.0* 94.8  --   --   --  95.5  --   PLT 52* 47* 41* 43*  --   --   --  48*  --     Basic Metabolic Panel: Recent Labs  Lab 08/15/19 0433 08/15/19 1612 08/16/19 0453 08/16/19 1657 08/16/19 1954 08/16/19 2300 08/17/19 0006 08/17/19 0400 08/17/19 0407  NA 146* 143 142 142 144 145 144 143 144  K 3.8 4.2 3.7 4.1 4.1 4.0 4.0 4.0 4.0  CL 113* 110 107 110  --   --   --  110  --   CO2 _0 --   --   --  23   --   GLUCOSE 126* 150* 114* 195*  --   --   --  125*  --   BUN 28* 30* 35* 41*  --   --   --  39*  --   CREATININE 0.52* 0.60* 0.48* 0.53*  --   --   --  0.50*  --   CALCIUM 8.6* 9.0 8.8* 8.4*  --   --   --  8.7*  --    GFR: Estimated Creatinine Clearance: 152.1 mL/min (A) (by C-G formula based on SCr of 0.5 mg/dL (L)). Recent Labs  Lab 08/14/19 0438 08/15/19 0433 08/16/19 0453 08/16/19 1317 08/16/19 1657 08/17/19 0400  WBC 8.4 7.8 7.7 7.3 7.5 8.3  LATICACIDVEN 1.5 1.2 1.3  --   --  1.6    Liver Function Tests: Recent Labs  Lab 08/13/19 0427 08/14/19 0438 08/15/19 0433 08/16/19 0453 08/17/19 0400  AST _1 ALT 33 33 29 24 40  ALKPHOS 43 46 46 47 52  BILITOT 0.6 0.9 0.9 1.0 0.8  PROT 5.1* 5.1* 5.1* 5.2* 5.4*  ALBUMIN 3.4* 3.1* 2.9* 3.0* 3.2*   No results for input(s): LIPASE, AMYLASE in the last  168 hours. No results for input(s): AMMONIA in the last 168 hours.  ABG    Component Value Date/Time   PHART 7.435 08/17/2019 0407   PCO2ART 36.7 08/17/2019 0407   PO2ART 58.0 (L) 08/17/2019 0407   HCO3 24.8 08/17/2019 0407   TCO2 26 08/17/2019 0407   ACIDBASEDEF 1.0 08/17/2019 0006   O2SAT 91.0 08/17/2019 0407     Coagulation Profile: Recent Labs  Lab 08/13/19 0427 08/14/19 0438 08/15/19 0433 08/16/19 0453 08/17/19 0400  INR 1.5* 1.4* 1.1 1.1 1.1    Cardiac Enzymes: No results for input(s): CKTOTAL, CKMB, CKMBINDEX, TROPONINI in the last 168 hours.  HbA1C: Hgb A1c MFr Bld  Date/Time Value Ref Range Status  07/24/2019 04:50 AM 6.7 (H) 4.8 - 5.6 % Final    Comment:    (NOTE) Pre diabetes:          5.7%-6.4% Diabetes:              >6.4% Glycemic control for   <7.0% adults with diabetes   09/30/2017 05:36 AM 5.7 (H) 4.8 - 5.6 % Final    Comment:    (NOTE)         Prediabetes: 5.7 - 6.4         Diabetes: >6.4         Glycemic control for adults with diabetes: <7.0     CBG: Recent Labs  Lab 08/16/19 1104 08/16/19 1543 08/16/19 1952  08/16/19 2259 08/17/19 0408  GLUCAP 172* 204* 119* 152* 117*     Critical care time:     CRITICAL CARE Performed by: Kipp Brood   Total critical care time: 50 minutes  Critical care time was exclusive of separately billable procedures and treating other patients.  Critical care was necessary to treat or prevent imminent or life-threatening deterioration.  Critical care was time spent personally by me on the following activities: development of treatment plan with patient and/or surrogate as well as nursing, discussions with consultants, evaluation of patient's response to treatment, examination of patient, obtaining history from patient or surrogate, ordering and performing treatments and interventions, ordering and review of laboratory studies, ordering and review of radiographic studies, pulse oximetry, re-evaluation of patient's condition and participation in multidisciplinary rounds.  Kipp Brood, MD [facility] ICU Physician [place]  Pager: [phone] Mobile: [phone] After hours: [phone].   08/17/2019, 7:12 AM

## 2019-08-18 DIAGNOSIS — R4589 Other symptoms and signs involving emotional state: Secondary | ICD-10-CM

## 2019-08-18 LAB — CBC
HCT: 30.4 % — ABNORMAL LOW (ref 39.0–52.0)
Hemoglobin: 9 g/dL — ABNORMAL LOW (ref 13.0–17.0)
MCH: 29.7 pg (ref 26.0–34.0)
MCHC: 29.6 g/dL — ABNORMAL LOW (ref 30.0–36.0)
MCV: 100.3 fL — ABNORMAL HIGH (ref 80.0–100.0)
Platelets: 65 10*3/uL — ABNORMAL LOW (ref 150–400)
RBC: 3.03 MIL/uL — ABNORMAL LOW (ref 4.22–5.81)
RDW: 20.1 % — ABNORMAL HIGH (ref 11.5–15.5)
WBC: 6.3 10*3/uL (ref 4.0–10.5)
nRBC: 1.4 % — ABNORMAL HIGH (ref 0.0–0.2)

## 2019-08-18 LAB — POCT I-STAT 7, (LYTES, BLD GAS, ICA,H+H)
Acid-base deficit: 2 mmol/L (ref 0.0–2.0)
Acid-base deficit: 2 mmol/L (ref 0.0–2.0)
Acid-base deficit: 3 mmol/L — ABNORMAL HIGH (ref 0.0–2.0)
Acid-base deficit: 3 mmol/L — ABNORMAL HIGH (ref 0.0–2.0)
Acid-base deficit: 4 mmol/L — ABNORMAL HIGH (ref 0.0–2.0)
Bicarbonate: 21 mmol/L (ref 20.0–28.0)
Bicarbonate: 22.3 mmol/L (ref 20.0–28.0)
Bicarbonate: 22.7 mmol/L (ref 20.0–28.0)
Bicarbonate: 23 mmol/L (ref 20.0–28.0)
Bicarbonate: 23.3 mmol/L (ref 20.0–28.0)
Calcium, Ion: 1.29 mmol/L (ref 1.15–1.40)
Calcium, Ion: 1.3 mmol/L (ref 1.15–1.40)
Calcium, Ion: 1.31 mmol/L (ref 1.15–1.40)
Calcium, Ion: 1.32 mmol/L (ref 1.15–1.40)
Calcium, Ion: 1.34 mmol/L (ref 1.15–1.40)
HCT: 26 % — ABNORMAL LOW (ref 39.0–52.0)
HCT: 26 % — ABNORMAL LOW (ref 39.0–52.0)
HCT: 27 % — ABNORMAL LOW (ref 39.0–52.0)
HCT: 27 % — ABNORMAL LOW (ref 39.0–52.0)
HCT: 27 % — ABNORMAL LOW (ref 39.0–52.0)
Hemoglobin: 8.8 g/dL — ABNORMAL LOW (ref 13.0–17.0)
Hemoglobin: 8.8 g/dL — ABNORMAL LOW (ref 13.0–17.0)
Hemoglobin: 9.2 g/dL — ABNORMAL LOW (ref 13.0–17.0)
Hemoglobin: 9.2 g/dL — ABNORMAL LOW (ref 13.0–17.0)
Hemoglobin: 9.2 g/dL — ABNORMAL LOW (ref 13.0–17.0)
O2 Saturation: 83 %
O2 Saturation: 86 %
O2 Saturation: 86 %
O2 Saturation: 89 %
O2 Saturation: 91 %
Patient temperature: 36.5
Patient temperature: 36.5
Patient temperature: 36.6
Patient temperature: 36.6
Patient temperature: 98.1
Potassium: 4 mmol/L (ref 3.5–5.1)
Potassium: 4.2 mmol/L (ref 3.5–5.1)
Potassium: 4.2 mmol/L (ref 3.5–5.1)
Potassium: 4.2 mmol/L (ref 3.5–5.1)
Potassium: 4.4 mmol/L (ref 3.5–5.1)
Sodium: 148 mmol/L — ABNORMAL HIGH (ref 135–145)
Sodium: 148 mmol/L — ABNORMAL HIGH (ref 135–145)
Sodium: 149 mmol/L — ABNORMAL HIGH (ref 135–145)
Sodium: 150 mmol/L — ABNORMAL HIGH (ref 135–145)
Sodium: 150 mmol/L — ABNORMAL HIGH (ref 135–145)
TCO2: 22 mmol/L (ref 22–32)
TCO2: 23 mmol/L (ref 22–32)
TCO2: 24 mmol/L (ref 22–32)
TCO2: 24 mmol/L (ref 22–32)
TCO2: 25 mmol/L (ref 22–32)
pCO2 arterial: 37.9 mmHg (ref 32.0–48.0)
pCO2 arterial: 38.1 mmHg (ref 32.0–48.0)
pCO2 arterial: 39.7 mmHg (ref 32.0–48.0)
pCO2 arterial: 40.1 mmHg (ref 32.0–48.0)
pCO2 arterial: 42.9 mmHg (ref 32.0–48.0)
pH, Arterial: 7.335 — ABNORMAL LOW (ref 7.350–7.450)
pH, Arterial: 7.346 — ABNORMAL LOW (ref 7.350–7.450)
pH, Arterial: 7.351 (ref 7.350–7.450)
pH, Arterial: 7.376 (ref 7.350–7.450)
pH, Arterial: 7.383 (ref 7.350–7.450)
pO2, Arterial: 48 mmHg — ABNORMAL LOW (ref 83.0–108.0)
pO2, Arterial: 52 mmHg — ABNORMAL LOW (ref 83.0–108.0)
pO2, Arterial: 54 mmHg — ABNORMAL LOW (ref 83.0–108.0)
pO2, Arterial: 57 mmHg — ABNORMAL LOW (ref 83.0–108.0)
pO2, Arterial: 60 mmHg — ABNORMAL LOW (ref 83.0–108.0)

## 2019-08-18 LAB — BASIC METABOLIC PANEL
Anion gap: 9 (ref 5–15)
Anion gap: 7 (ref 5–15)
BUN: 33 mg/dL — ABNORMAL HIGH (ref 6–20)
BUN: 32 mg/dL — ABNORMAL HIGH (ref 6–20)
CO2: 22 mmol/L (ref 22–32)
CO2: 22 mmol/L (ref 22–32)
Calcium: 8.6 mg/dL — ABNORMAL LOW (ref 8.9–10.3)
Calcium: 8.6 mg/dL — ABNORMAL LOW (ref 8.9–10.3)
Chloride: 115 mmol/L — ABNORMAL HIGH (ref 98–111)
Chloride: 117 mmol/L — ABNORMAL HIGH (ref 98–111)
Creatinine, Ser: 0.51 mg/dL — ABNORMAL LOW (ref 0.61–1.24)
Creatinine, Ser: 0.49 mg/dL — ABNORMAL LOW (ref 0.61–1.24)
GFR calc Af Amer: >60 mL/min (ref >60)
GFR calc Af Amer: >60 mL/min (ref >60)
GFR calc non Af Amer: >60 mL/min (ref >60)
GFR calc non Af Amer: >60 mL/min (ref >60)
Glucose, Bld: 132 mg/dL — ABNORMAL HIGH (ref 70–99)
Glucose, Bld: 151 mg/dL — ABNORMAL HIGH (ref 70–99)
Potassium: 4.1 mmol/L (ref 3.5–5.1)
Potassium: 4.1 mmol/L (ref 3.5–5.1)
Sodium: 146 mmol/L — ABNORMAL HIGH (ref 135–145)
Sodium: 146 mmol/L — ABNORMAL HIGH (ref 135–145)

## 2019-08-18 LAB — PROTIME-INR
INR: 1.1 (ref 0.8–1.2)
Prothrombin Time: 14.4 seconds (ref 11.4–15.2)

## 2019-08-18 LAB — CBC WITH DIFFERENTIAL/PLATELET
Abs Immature Granulocytes: 1.53 10*3/uL — ABNORMAL HIGH (ref 0.00–0.07)
Basophils Absolute: 0.1 10*3/uL (ref 0.0–0.1)
Basophils Relative: 1 %
Eosinophils Absolute: 0.1 10*3/uL (ref 0.0–0.5)
Eosinophils Relative: 1 %
HCT: 30.6 % — ABNORMAL LOW (ref 39.0–52.0)
Hemoglobin: 9.2 g/dL — ABNORMAL LOW (ref 13.0–17.0)
Immature Granulocytes: 21 %
Lymphocytes Relative: 20 %
Lymphs Abs: 1.4 10*3/uL (ref 0.7–4.0)
MCH: 29.2 pg (ref 26.0–34.0)
MCHC: 30.1 g/dL (ref 30.0–36.0)
MCV: 97.1 fL (ref 80.0–100.0)
Monocytes Absolute: 1.2 10*3/uL — ABNORMAL HIGH (ref 0.1–1.0)
Monocytes Relative: 16 %
Neutro Abs: 2.9 10*3/uL (ref 1.7–7.7)
Neutrophils Relative %: 41 %
Platelets: 61 10*3/uL — ABNORMAL LOW (ref 150–400)
RBC: 3.15 MIL/uL — ABNORMAL LOW (ref 4.22–5.81)
RDW: 19.9 % — ABNORMAL HIGH (ref 11.5–15.5)
WBC: 7.2 10*3/uL (ref 4.0–10.5)
nRBC: 1.1 % — ABNORMAL HIGH (ref 0.0–0.2)

## 2019-08-18 LAB — HEPATIC FUNCTION PANEL
ALT: 31 U/L (ref 0–44)
AST: 13 U/L — ABNORMAL LOW (ref 15–41)
Albumin: 2.7 g/dL — ABNORMAL LOW (ref 3.5–5.0)
Alkaline Phosphatase: 49 U/L (ref 38–126)
Bilirubin, Direct: <0.1 mg/dL (ref 0.0–0.2)
Total Bilirubin: 0.4 mg/dL (ref 0.3–1.2)
Total Protein: 5.2 g/dL — ABNORMAL LOW (ref 6.5–8.1)

## 2019-08-18 LAB — HEPARIN LEVEL (UNFRACTIONATED)
Heparin Unfractionated: 0.4 IU/mL (ref 0.30–0.70)
Heparin Unfractionated: 0.51 IU/mL (ref 0.30–0.70)

## 2019-08-18 LAB — LACTATE DEHYDROGENASE: LDH: 344 U/L — ABNORMAL HIGH (ref 98–192)

## 2019-08-18 LAB — GLUCOSE, CAPILLARY
Glucose-Capillary: 144 mg/dL — ABNORMAL HIGH (ref 70–99)
Glucose-Capillary: 107 mg/dL — ABNORMAL HIGH (ref 70–99)
Glucose-Capillary: 134 mg/dL — ABNORMAL HIGH (ref 70–99)
Glucose-Capillary: 131 mg/dL — ABNORMAL HIGH (ref 70–99)
Glucose-Capillary: 118 mg/dL — ABNORMAL HIGH (ref 70–99)

## 2019-08-18 LAB — COOXEMETRY PANEL
Carboxyhemoglobin: 2.9 % — ABNORMAL HIGH (ref 0.5–1.5)
Methemoglobin: 0.8 % (ref 0.0–1.5)
O2 Saturation: 72.7 %
Total hemoglobin: 10.6 g/dL — ABNORMAL LOW (ref 12.0–16.0)

## 2019-08-18 LAB — TRIGLYCERIDES: Triglycerides: 236 mg/dL — ABNORMAL HIGH (ref <150)

## 2019-08-18 LAB — LACTIC ACID, PLASMA: Lactic Acid, Venous: 2 mmol/L (ref 0.5–1.9)

## 2019-08-18 LAB — FIBRINOGEN: Fibrinogen: 463 mg/dL (ref 210–475)

## 2019-08-18 MED ORDER — FUROSEMIDE 10 MG/ML IJ SOLN
20.0000 mg | Freq: Once | INTRAMUSCULAR | Status: AC
  Administered 2019-08-18: 20 mg via INTRAVENOUS
  Filled 2019-08-18: qty 2

## 2019-08-18 MED ORDER — DIAZEPAM 5 MG PO TABS
20.0000 mg | ORAL_TABLET | Freq: Four times a day (QID) | ORAL | Status: DC
  Administered 2019-08-18 – 2019-08-22 (×18): 20 mg via ORAL
  Filled 2019-08-18 (×20): qty 4

## 2019-08-18 MED ORDER — FREE WATER
300.0000 mL | Freq: Three times a day (TID) | Status: DC
  Administered 2019-08-18 – 2019-08-30 (×32): 300 mL

## 2019-08-18 NOTE — Progress Notes (Signed)
   Palliative Medicine Inpatient Follow Up Note   HPI: 44 y/o M admitted 12/14 with COVIDpneumonia causing acutehypoxemic respiratory failure. Developed pneumomediastinum 12/23 with concern for possible bilateral small pneumothoraces on 12/24. PCCM consulted for evaluation of pneumothoraces.  Today's Discussion (08/18/2019): Reviewed chart. Evaluated patient at bedside. His mother, [name] was present praying next to him reading versus of the bible. We talked extensively about how [name] was doing. She shared her hopes that he will get better. Talked about how awful the world is right now and that her pastor had also contracted covid-19. Shared stories and pictures of [name] He has a beloved [name] who brings him tremendous joy. Able to see pictures of patient, his wife, and son. Offered therapeutic listening and emotional support.   Will continue to communicate with family incrementally.  Time In: 1500 Time Out: 1530 Time Spent: 25 Greater than 50% of the time was spent in counseling and coordination of care ______________________________________________________________________________________ [place] Team Team Cell Phone: [phone] Please utilize secure chat with additional questions, if there is no response within 30 minutes please call the above phone number  Palliative Medicine Team providers are available by phone from 7am to 7pm daily and can be reached through the team cell phone.  Should this patient require assistance outside of these hours, please call the patient's attending physician.

## 2019-08-18 NOTE — Progress Notes (Signed)
ANTICOAGULATION CONSULT NOTE  Pharmacy Consult for heparin Indication: ECMO  No Known Allergies  Patient Measurements: Height: 5' 8"  (172.7 cm) Weight: 283 lb 11.7 oz (128.7 kg) IBW/kg (Calculated) : 68.4 Heparin Dosing Weight: 100.7 kg  Vital Signs: Temp: 97.3 F (36.3 C) (01/10 1545) Pulse Rate: 66 (01/10 1800)  Labs: Recent Labs    08/16/19 0453 08/16/19 0501 08/16/19 1657 08/16/19 2300 08/17/19 0400 08/17/19 0407 08/17/19 1607 08/18/19 0414 08/18/19 0417 08/18/19 1519 08/18/19 1619  HGB 9.1*  --  9.7* 10.2* 10.1*  --  9.4* 9.2* 9.2* 9.0* 9.2*  HCT 29.9*  --  30.9* 30.0* 32.2*  --  30.6* 30.6* 27.0* 30.4* 27.0*  PLT 47*   < > 43*  --  48*  --  53* 61*  --  65*  --   APTT  --   --  121* 95* 80*  --   --   --   --   --   --   LABPROT 14.4  --   --   --  14.2  --   --  14.4  --   --   --   INR 1.1  --   --   --  1.1  --   --  1.1  --   --   --   HEPARINUNFRC 0.32  --  0.58 0.46 0.27*   < > 0.42 0.40  --  0.51  --   CREATININE 0.48*  --  0.53*  --  0.50*  --  0.59* 0.51*  --  0.49*  --    < > = values in this interval not displayed.    Estimated Creatinine Clearance: 152.6 mL/min (A) (by C-G formula based on SCr of 0.49 mg/dL (L)).  Assessment: 44 yo male known COVID+ and ARDS VV ECMO. Heparin was restarted 1/5 (bivalirudin stopped as HIT test was negative).   h/h stable, pltc low stable range 40-60 LDH 300s> 466 > 344. Fibrinogen 100s>380>460. Still a little oozing from nose and trach site but improved from previous   Heparin drip 1700 uts/hr HL 0.51 ( aiming for upper end of range after circuit change 1/8)     Goal of Therapy:  Heparin level: 0.3-0.5 APTT goal: 66-85 sec Monitor platelets by anticoagulation protocol: Yes   Plan:  -Continue IV heparin 1700 units/hr. -Recheck aPTT/Heparin level q12h -Monitor s/sx of bleeding  [name], [facility] Clinical Pharmacist Phone [phone]  08/18/2019 7:28 PM

## 2019-08-18 NOTE — Progress Notes (Signed)
NAME:  [name], MRN:  [id number], DOB:  05-11-1976, LOS: 69 ADMISSION DATE:  07/22/2019, CONSULTATION DATE:  12/24 REFERRING MD:  Sloan Leiter, CHIEF COMPLAINT:  Dyspnea   Brief History   44 y/o M admitted 12/14 with COVID pneumonia causing acute hypoxemic respiratory failure. Developed pneumomediastinum 12/23 with concern for possible bilateral small pneumothoraces on 12/24.  PCCM consulted for evaluation of pneumothoraces  Past Medical History  GERD HTN Asthma  Significant Hospital Events   12/14 admit with hypoxemic respiratory failure in setting of COVID-19 pneumonia 12/23 noted to have pneumomediastinum on chest x-ray 12/24 PCCM consulted for evaluation 12/26 worsening hypoxemia, intubated  12/27 VV fem/fem ECMO cannulation 12/28 ETT switch 12/29 Tracheostomy 12/31 Oozing from multiple sites, H/H drop. Heparin held briefly and lidocaine with epi injected around trach site, Cryo, DDVAP given 1/3 CVL being discontinued  1/5: repacked nares  Consults:  PCCM  Procedures:  1/4 bronch: bal performed  Significant Diagnostic Tests:  CT chest 12/22 >> extensive pneumomediastinum, multifocal patchy bilateral groundglass opacities  Micro Data:  BCx2 12/14 >> negative  Sputum 12/15 >> negative  1/4 bronch:  1/2 acid fast smear bronch: negative 1/2 fungal cx bronch: pending  Antimicrobials:  Received remdesivir, steroids, actemra and convalescent plasma  5 days cefepime 12/24-12/29 Cefepime 1/4-> vanc 1/4->   Interim history/subjective:  Continue to be unable to wean paralytic or sedation, in particular propofol.  Significant desaturation with turns despite no change in flow via pump.  Objective   Blood pressure (!) 144/57, pulse 66, temperature (!) 97.3 F (36.3 C), resp. rate (!) 33, height _0  (1.727 m), weight 128.7 kg, SpO2 91 %. CVP:  [8 mmHg-14 mmHg] 14 mmHg  Vent Mode: PCV FiO2 (%):  [50 %-100 %] 50 % Set Rate:  [20 bmp] 20 bmp PEEP:  [12 cmH20] 12  cmH20 Plateau Pressure:  [24 cmH20-26 cmH20] 24 cmH20   Intake/Output Summary (Last 24 hours) at 08/18/2019 1822 Last data filed at 08/18/2019 1800 Gross per 24 hour  Intake 8003.97 ml  Output 5105 ml  Net 2898.97 ml   Filed Weights   08/16/19 0500 08/17/19 0328 08/18/19 0350  Weight: 127.8 kg 128 kg 128.7 kg   Examination GEN: Critically ill appearing middle aged male. Sedated and chemically paralyzed, trach/vent, on VV ECMO. Moderate obesity HEENT: [place]. Nasal packing with serosang drainage noted Trach intact without bleeding. Pink mmm.  CV: RRR. 2+ radial pulses. No rgm.  PULM: Minimal chest expansion with very low tidal volumes. Expansion is symmetrical. Bronchial breath sounds throughout. Chest tubes with serosanguinous output.  R with small air-leak. Vt 90 with driving pressure of 90 GI: Obese, soft, active bowel sounds EXT: Symmetrical bulk and tone without cyanosis or clubbing. Bilateral femoral cannulas, insertion sites are clean and dry without bleeding.  NEURO: Sedated and chemically paralyzed. PERR.  SKIN: Pale, cool, clean, dry. Few small, round, pustular eruptions across chest.   Resolved Hospital Problem list    Assessment & Plan:    Critically ill due to acute hypoxemic respiratory failure with ARDS secondary to COVID-19, s/p tracheostomy requiring mechanical ventilation and VV ECMO support. Continue ECMO at current flow rate.  Bronchoscopy for secretion clearance as needed Continue to follow Vt on PC. Keep Driving pressure 15 of less. Hold diurese for today.  Continues to desaturate with minimal stimulation.  Increase enteral sedation.  Continue sedative infusions and paralytic. Premedicate with Dilaudid prior to turns.  Staph epi and serratia marcescens pneumonia:  cefepime and vancomycin for  7 days  Bilateral pneumothoraces and pneumomediastinum R with some leak (likely where loss of tv is) Continue suction  -20cmH2O  Posterior epistaxis.  repacked 1/5  after discussion with ENT  Continue antibiotics (on for above pna) while packing in place for TSS prevention (ceftriaxone otherwise)   Daily Goals Checklist  Pain/Anxiety/Delirium protocol (if indicated): morphine and valium seroquel enterally. Dilaudid, propofol, versed infusions. Increase enteral agents to decrease drip dependence. Convert morphine to dilaudid due to flushing.  VAP protocol (if indicated): Bundle in place. Respiratory support goals: Continue PCV with ultra-lung protective ventilation  Blood pressure target: On NE to keep MAP>65  DVT prophylaxis: on systemic anticoagulation on VV-ECMO -  Nutrition Status: Nutrition Problem: Increased nutrient needs Etiology: acute illness(COVID PNA) Signs/Symptoms: estimated needs Interventions: Tube feeding, Prostat GI prophylaxis: Protonix per tube. Fluid status goals: diuresis to achieve - 1 to -1.5. (still net even by I/O) Urinary catheter: Guide hemodynamic management Glucose control: euglycemic on SSI. Mobility/therapy needs: Bedrest.  Antibiotic de-escalation: complete 7 days of antibiotics for pneumonia. Home medication reconciliation: reviewed. Daily labs: as per ECMO protocol. Code Status: Full code  Family Communication: Wife updated by nurse. Disposition: ICU   Labs   CBC: Recent Labs  Lab 08/16/19 1317 08/16/19 1657 08/17/19 0400 08/17/19 1607 08/18/19 0045 08/18/19 0414 08/18/19 0417 08/18/19 1519 08/18/19 1619  WBC 7.3 7.5 8.3 6.7  --  7.2  --  6.3  --   NEUTROABS 3.9  --  3.6  --   --  2.9  --   --   --   HGB 8.8* 9.7* 10.1* 9.4* 8.8* 9.2* 9.2* 9.0* 9.2*  HCT 29.5* 30.9* 32.2* 30.6* 26.0* 30.6* 27.0* 30.4* 27.0*  MCV 101.0* 94.8 95.5 96.5  --  97.1  --  100.3*  --   PLT 41* 43* 48* 53*  --  61*  --  65*  --     Basic Metabolic Panel: Recent Labs  Lab 08/16/19 1657 08/17/19 0400 08/17/19 1607 08/18/19 0045 08/18/19 0414 08/18/19 0417 08/18/19 1519 08/18/19 1619  NA 142 143 144 148* 146* 149*  146* 150*  K 4.1 4.0 4.5 4.2 4.1 4.2 4.1 4.2  CL 110 110 113*  --  115*  --  117*  --   CO2 _0 --  22  --  22  --   GLUCOSE 195* 125* 205*  --  132*  --  151*  --   BUN 41* 39* 34*  --  33*  --  32*  --   CREATININE 0.53* 0.50* 0.59*  --  0.51*  --  0.49*  --   CALCIUM 8.4* 8.7* 8.5*  --  8.6*  --  8.6*  --    GFR: Estimated Creatinine Clearance: 152.6 mL/min (A) (by C-G formula based on SCr of 0.49 mg/dL (L)). Recent Labs  Lab 08/15/19 0433 08/16/19 0453 08/17/19 0400 08/17/19 1607 08/18/19 0414 08/18/19 1519  WBC 7.8 7.7 8.3 6.7 7.2 6.3  LATICACIDVEN 1.2 1.3 1.6  --  2.0*  --     Liver Function Tests: Recent Labs  Lab 08/14/19 0438 08/15/19 0433 08/16/19 0453 08/17/19 0400 08/18/19 0414  AST _1 13*  ALT 33 29 24 40 31  ALKPHOS 46 46 47 52 49  BILITOT 0.9 0.9 1.0 0.8 0.4  PROT 5.1* 5.1* 5.2* 5.4* 5.2*  ALBUMIN 3.1* 2.9* 3.0* 3.2* 2.7*   No results for input(s): LIPASE, AMYLASE in the last 168 hours. No results  for input(s): AMMONIA in the last 168 hours.  ABG    Component Value Date/Time   PHART 7.335 (L) 08/18/2019 1619   PCO2ART 42.9 08/18/2019 1619   PO2ART 54.0 (L) 08/18/2019 1619   HCO3 23.0 08/18/2019 1619   TCO2 24 08/18/2019 1619   ACIDBASEDEF 3.0 (H) 08/18/2019 1619   O2SAT 86.0 08/18/2019 1619     Coagulation Profile: Recent Labs  Lab 08/14/19 0438 08/15/19 0433 08/16/19 0453 08/17/19 0400 08/18/19 0414  INR 1.4* 1.1 1.1 1.1 1.1    Cardiac Enzymes: No results for input(s): CKTOTAL, CKMB, CKMBINDEX, TROPONINI in the last 168 hours.  HbA1C: Hgb A1c MFr Bld  Date/Time Value Ref Range Status  07/24/2019 04:50 AM 6.7 (H) 4.8 - 5.6 % Final    Comment:    (NOTE) Pre diabetes:          5.7%-6.4% Diabetes:              >6.4% Glycemic control for   <7.0% adults with diabetes   09/30/2017 05:36 AM 5.7 (H) 4.8 - 5.6 % Final    Comment:    (NOTE)         Prediabetes: 5.7 - 6.4         Diabetes: >6.4         Glycemic  control for adults with diabetes: <7.0     CBG: Recent Labs  Lab 08/17/19 1940 08/18/19 0044 08/18/19 0422 08/18/19 0734 08/18/19 1203  GLUCAP 154* 144* 107* 134* 131*     Critical care time:     CRITICAL CARE Performed by: Kipp Brood   Total critical care time: 50 minutes  Critical care time was exclusive of separately billable procedures and treating other patients.  Critical care was necessary to treat or prevent imminent or life-threatening deterioration.  Critical care was time spent personally by me on the following activities: development of treatment plan with patient and/or surrogate as well as nursing, discussions with consultants, evaluation of patient's response to treatment, examination of patient, obtaining history from patient or surrogate, ordering and performing treatments and interventions, ordering and review of laboratory studies, ordering and review of radiographic studies, pulse oximetry, re-evaluation of patient's condition and participation in multidisciplinary rounds.  Kipp Brood, MD [facility] ICU Physician [place]  Pager: [phone] Mobile: [phone] After hours: [phone].   08/18/2019, 6:22 PM

## 2019-08-18 NOTE — Progress Notes (Signed)
[name], pt's mother took pt's cell phone, Games developer, and clothes home.  Vanna [place] is located at [employer] in the security office [phone]).  Verified with [place] security that [name], pt's wife, may pick up wallet there.  Called [place] and left voicemail.

## 2019-08-18 NOTE — Progress Notes (Signed)
Patient ID: [name], male   DOB: 07-22-1976, 44 y.o.   MRN: [id number]    Advanced Heart Failure Rounding Note   Subjective:    Remains on VV ecmo with good flows. Remains sedated and paralyzed.   Circuit changed and trach exchanged on 1/8.   Yesterday propofol was stopped and patient with immediate problems with low flows and chugging with drop in sats. Restarted with improvement. However sats remained in 80s yesterday afternoon. Now back up in 90s with full sedation and paralysis.   ABG 7.38/38/60/91%  CXR: Bilateral lung opacities c/w ARDS again improving slightly. Personally reviewed   On vent 50% PEEP 12. Driving pressure 12 No change. TV ~ 70-100 on PC. Reviewed personally   ECMO parameters Flow 5.65 RPM 3800 dP 34 Pressure -116  Objective:   Weight Range:  Vital Signs:   Temp:  [97.3 F (36.3 C)-98.1 F (36.7 C)] 97.9 F (36.6 C) (01/10 0430) Pulse Rate:  [66-93] 66 (01/10 0630) Resp:  [0-78] 42 (01/10 0630) SpO2:  [79 %-95 %] 92 % (01/10 0826) Arterial Line BP: (107-141)/(44-61) 137/53 (01/10 0630) FiO2 (%):  [50 %-100 %] 50 % (01/10 0826) Weight:  [128.7 kg] 128.7 kg (01/10 0350) Last BM Date: (flexiseal in place with constant ooze)  Weight change: Filed Weights   08/16/19 0500 08/17/19 0328 08/18/19 0350  Weight: 127.8 kg 128 kg 128.7 kg    Intake/Output:   Intake/Output Summary (Last 24 hours) at 08/18/2019 0839 Last data filed at 08/18/2019 0620 Gross per 24 hour  Intake 7325.91 ml  Output 4920 ml  Net 2405.91 ml     Physical Exam: General:  Sedated on vent HEENT: normal + nasal packing Neck: supple. + trach  Hard to see JVD. Carotids 2+ bilat; no bruits. No lymphadenopathy or thryomegaly appreciated. Cor: PMI nondisplaced. Regular rate & rhythm. No rubs, gallops or murmurs. Lungs: coarse minimal movement +CTs Abdomen: obese soft, nontender, nondistended. No hepatosplenomegaly. No bruits or masses. Good bowel sounds. Extremities: no  cyanosis, clubbing, rash, tr-1+ edema Cannulas ok Neuro: sedated paralyzed   Telemetry: Sinus 70-80s Personally reviewed  Labs: Basic Metabolic Panel: Recent Labs  Lab 08/16/19 0453 08/16/19 1657 08/17/19 0400 08/17/19 1607 08/17/19 1941 08/17/19 2254 08/18/19 0045 08/18/19 0414 08/18/19 0417  NA 142 142 143 144 148* 148* 148* 146* 149*  K 3.7 4.1 4.0 4.5 4.5 4.4 4.2 4.1 4.2  CL 107 110 110 113*  --   --   --  115*  --   CO2 24 22 23 22   --   --   --  22  --   GLUCOSE 114* 195* 125* 205*  --   --   --  132*  --   BUN 35* 41* 39* 34*  --   --   --  33*  --   CREATININE 0.48* 0.53* 0.50* 0.59*  --   --   --  0.51*  --   CALCIUM 8.8* 8.4* 8.7* 8.5*  --   --   --  8.6*  --     Liver Function Tests: Recent Labs  Lab 08/14/19 0438 08/15/19 0433 08/16/19 0453 08/17/19 0400 08/18/19 0414  AST 21 18 22 21  13*  ALT 33 29 24 40 31  ALKPHOS 46 46 47 52 49  BILITOT 0.9 0.9 1.0 0.8 0.4  PROT 5.1* 5.1* 5.2* 5.4* 5.2*  ALBUMIN 3.1* 2.9* 3.0* 3.2* 2.7*   No results for input(s): LIPASE, AMYLASE in the last 168 hours.  No results for input(s): AMMONIA in the last 168 hours.  CBC: Recent Labs  Lab 08/16/19 1317 08/16/19 1657 08/17/19 0400 08/17/19 1607 08/17/19 1941 08/17/19 2254 08/18/19 0045 08/18/19 0414 08/18/19 0417  WBC 7.3 7.5 8.3 6.7  --   --   --  7.2  --   NEUTROABS 3.9  --  3.6  --   --   --   --  2.9  --   HGB 8.8* 9.7* 10.1* 9.4* 8.8* 9.2* 8.8* 9.2* 9.2*  HCT 29.5* 30.9* 32.2* 30.6* 26.0* 27.0* 26.0* 30.6* 27.0*  MCV 101.0* 94.8 95.5 96.5  --   --   --  97.1  --   PLT 41* 43* 48* 53*  --   --   --  61*  --     Cardiac Enzymes: No results for input(s): CKTOTAL, CKMB, CKMBINDEX, TROPONINI in the last 168 hours.  BNP: BNP (last 3 results) Recent Labs    07/22/19 1039  BNP 15.3    ProBNP (last 3 results) No results for input(s): PROBNP in the last 8760 hours.    Other results:  Imaging: DG CHEST PORT 1 VIEW  Result Date: 08/17/2019 CLINICAL  DATA:  44 year old male with hypoxia. EXAM: PORTABLE CHEST 1 VIEW COMPARISON:  Earlier chest radiograph dated 08/17/2019. FINDINGS: Partially visualized endotracheal tube with tip approximately 3.3 cm above the carina. Feeding tube extends below the diaphragm with tip beyond the inferior margin of the image. Bilateral chest tubes in similar position. Right-sided PICC and a cannula over the expected location of the IVC similar to prior radiograph. Shallow inspiration with extensive bilateral airspace opacities without significant interval change. The cardiac borders are silhouetted. No pneumothorax. No acute osseous pathology. IMPRESSION: 1. Shallow inspiration with extensive bilateral airspace opacity without significant interval change. 2. Support apparatus in stable position. Electronically Signed   By: Anner Crete M.D.   On: 08/17/2019 23:54   DG Chest Port 1 View  Result Date: 08/17/2019 CLINICAL DATA:  Personal history of ECMO. Respiratory failure. EXAM: PORTABLE CHEST 1 VIEW COMPARISON:  08/16/2019 FINDINGS: Tracheostomy tube is unchanged. A feeding tube courses into the abdomen with tip not imaged. Bilateral chest tubes remain in place. A right PICC is unchanged, and there is a persistent large bore cannula in the expected vicinity of the inferior cavoatrial junction. Lung volumes remain low with severe airspace consolidation throughout both lungs, not significantly changed. No pneumothorax is identified. IMPRESSION: Unchanged appearance of the chest with severe bilateral airspace consolidation. Electronically Signed   By: Logan Bores M.D.   On: 08/17/2019 09:31     Medications:     Scheduled Medications: . artificial tears  1 application Both Eyes Z6X  . vitamin C  500 mg Per Tube Daily  . chlorhexidine gluconate (MEDLINE KIT)  15 mL Mouth Rinse BID  . Chlorhexidine Gluconate Cloth  6 each Topical Daily  . dexamethasone (DECADRON) injection  1 mg Intravenous Q24H  . diazepam  20 mg Oral  Q6H  . feeding supplement (PRO-STAT SUGAR FREE 64)  60 mL Per Tube BID  . free water  200 mL Per Tube Q8H  . gabapentin  300 mg Per Tube Q8H  . HYDROmorphone  8 mg Per Tube Q4H  . insulin aspart  3-9 Units Subcutaneous Q4H  . mouth rinse  15 mL Mouth Rinse 10 times per day  . oxymetazoline  1 spray Each Nare BID  . pantoprazole sodium  40 mg Per Tube QHS  . QUEtiapine  100 mg Per Tube BID  . sodium chloride flush  10-40 mL Intracatheter Q12H  . sodium chloride flush  10-40 mL Intracatheter Q12H  . Thrombi-Pad  1 each Topical Once  . zinc sulfate  220 mg Per Tube Daily    Infusions: . sodium chloride    . sodium chloride Stopped (08/17/19 1742)  . albumin human 25 g (08/18/19 0728)  . ceFEPime (MAXIPIME) IV 2 g (08/18/19 9924)  . cisatracurium (NIMBEX) infusion 8 mcg/kg/min (08/18/19 0620)  . dextrose    . feeding supplement (PIVOT 1.5 CAL) 1,000 mL (08/17/19 1743)  . heparin 1,700 Units/hr (08/18/19 0620)  . HYDROmorphone 12 mg/hr (08/18/19 0620)  . midazolam (VERSED) infusion 40 mg/hr (08/18/19 0839)  . norepinephrine (LEVOPHED) Adult infusion Stopped (08/18/19 0612)  . propofol (DIPRIVAN) infusion 80 mcg/kg/min (08/18/19 0804)  . vancomycin Stopped (08/18/19 0313)    PRN Medications: Place/Maintain arterial line **AND** sodium chloride, sodium chloride, acetaminophen (TYLENOL) oral liquid 160 mg/5 mL, acetaminophen, albumin human, dextrose, hydrALAZINE, HYDROmorphone, labetalol, magnesium hydroxide, metoprolol tartrate, midazolam, ondansetron (ZOFRAN) IV, senna-docusate, sodium chloride, sodium chloride flush, sodium chloride flush, sodium chloride flush   Assessment/Plan:    1. Acute hypoxic/hypercarbic respiratory failure due to COVID PNA: Remained markedly hypoxic despite full support. Intubated 12/26. S/p bilateral CTs 12/26 for pneumothoraces.  TEE on 12/26 LVEF 70% RV ok. VV ECMO begun 12/26. Day #9 VV ECMO. Tracheostomy 12/29.  COVID ARDS at this point.  Flows stable  and sats improved with deeper sedation and re-paralysis. Ecmo circuit changed 1/8 - ECMO parameters stable Sats dropped yesterday with stopping sedation. He has failed wean of sedatives MULTIPLE times. Would not try this again until we see evidence of lung recovery with improving tidal volumes - Cannula position reviewed on CXR with CCM. Ok.  - Volume status ok. Will give 1 amp albumin to keep flows high. Can resume diuresis as needed - Bronch 1/8. Minimal secretions - Sats running low 90s TVs 70 -> 100. Driving pressure 26-83 range. Continue to support with PC. CXR mildly improved. Continue PS. Suspect TVs will slowly trend up as lung complaince improves. As above. Do NOT wean sedation until we have signs of lung recovery - Continue lung rest.  - PLTs 61k this am. LDH 402-> 344   2. Bilateral PTX: Due to barotrauma.  - Driving pressure minimized at 12-13. Stable this am No change  3. COVID PNA: management of COVID infection per CCM. CXR with bilateral multifocal PNA progressing to likely ARDS.  - s/p 10 days of remdesivir - 12/16, 12/2 s/p tociluzimab - 12/17 s/p convalescent plasma - On decadron at 43m/daily until 1/11 - On cefepime and vanc - see below. Discussed dosing with PharmD personally.Continue course  4. ID: Empiric coverage with vancomycin/cefepime initially for possible secondary bacterial PNA, course completed.   - now back on cefepime and vanc for + cx with staph epidimidis, MSSA and serratia (sensitivities ok).  - Remains stable - Reviewed with PharmD this am   5. Thrombocytopenia: Slow decrease in platelets, likely low level hemolysis + critical illness.  - switched to bival on 1/4. HIT negative - 60k ths am.  - Back on heparin. Heparin level ok at 0.4.  Dosing discussed with PharmD and it is perfect  6. Anemia: Bleeding at trach site resolved but still bleeding at right chest tube site and CVL. - hgb 10.2 -> 9.2 - Transfuse hgb < 8   7. FEN - continue TFs  D/w  with ECMO team  and CCM at bedside on multidisciplinary rounds. Continue to await ARDS recovery. Details as above.  Length of Stay: 27   CRITICAL CARE Performed by: Glori Bickers  Total critical care time: 40 minutes  Critical care time was exclusive of separately billable procedures and treating other patients.  Critical care was necessary to treat or prevent imminent or life-threatening deterioration.  Critical care was time spent personally by me on the following activities: development of treatment plan with patient and/or surrogate as well as nursing, discussions with consultants, evaluation of patient's response to treatment, examination of patient, obtaining history from patient or surrogate, ordering and performing treatments and interventions, ordering and review of laboratory studies, ordering and review of radiographic studies, pulse oximetry and re-evaluation of patient's condition.    Glori Bickers MD 08/18/2019, 8:39 AM  Advanced Heart Failure Team Pager [phone] (M-F; 7a - 4p)  Please contact [facility] for night-coverage after hours (4p -7a ) and weekends on [email or web address]

## 2019-08-18 NOTE — Progress Notes (Signed)
ANTICOAGULATION CONSULT NOTE  Pharmacy Consult for heparin Indication: ECMO  No Known Allergies  Patient Measurements: Height: 5' 8"  (172.7 cm) Weight: 283 lb 11.7 oz (128.7 kg) IBW/kg (Calculated) : 68.4 Heparin Dosing Weight: 100.7 kg  Vital Signs: Temp: 97.9 F (36.6 C) (01/10 0430) Pulse Rate: 66 (01/10 0630)  Labs: Recent Labs    08/16/19 0453 08/16/19 0501 08/16/19 1657 08/16/19 2300 08/17/19 0400 08/17/19 1230 08/17/19 1607 08/18/19 0045 08/18/19 0414 08/18/19 0417  HGB 9.1*  --  9.7* 10.2* 10.1*  --  9.4* 8.8* 9.2* 9.2*  HCT 29.9*  --  30.9* 30.0* 32.2*  --  30.6* 26.0* 30.6* 27.0*  PLT 47*   < > 43*  --  48*  --  53*  --  61*  --   APTT  --   --  121* 95* 80*  --   --   --   --   --   LABPROT 14.4  --   --   --  14.2  --   --   --  14.4  --   INR 1.1  --   --   --  1.1  --   --   --  1.1  --   HEPARINUNFRC 0.32  --  0.58 0.46 0.27* 0.27* 0.42  --  0.40  --   CREATININE 0.48*  --  0.53*  --  0.50*  --  0.59*  --  0.51*  --    < > = values in this interval not displayed.    Estimated Creatinine Clearance: 152.6 mL/min (A) (by C-G formula based on SCr of 0.51 mg/dL (L)).  Assessment: 44 yo male known COVID+ and ARDS VV ECMO. Heparin was restarted 1/5 (bivalirudin stopped as HIT test was negative).   h/h stable, pltc low stable range 40-60 LDH 300s> 466 > 344. Fibrinogen 100s>380>460. Still a little oozing from nose and trach site but improved from previous  Heparin drip 1700 uts/hr HL 0.4 ( aiming for upper end of range after circuit change 1/8)     Goal of Therapy:  Heparin level: 0.3-0.5 APTT goal: 66-85 sec Monitor platelets by anticoagulation protocol: Yes   Plan:  -Continue IV heparin 1700 units/hr. -Recheck aPTT/Heparin level q12h -Monitor s/sx of bleeding    Bonnita Nasuti Pharm.D. CPP, BCPS Clinical Pharmacist [phone] 08/18/2019 9:01 AM

## 2019-08-19 ENCOUNTER — Inpatient Hospital Stay (HOSPITAL_COMMUNITY): Payer: Managed Care, Other (non HMO)

## 2019-08-19 LAB — BASIC METABOLIC PANEL
Anion gap: 10 (ref 5–15)
Anion gap: 7 (ref 5–15)
Anion gap: 9 (ref 5–15)
BUN: 31 mg/dL — ABNORMAL HIGH (ref 6–20)
BUN: 33 mg/dL — ABNORMAL HIGH (ref 6–20)
BUN: 34 mg/dL — ABNORMAL HIGH (ref 6–20)
CO2: 21 mmol/L — ABNORMAL LOW (ref 22–32)
CO2: 22 mmol/L (ref 22–32)
CO2: 22 mmol/L (ref 22–32)
Calcium: 8.4 mg/dL — ABNORMAL LOW (ref 8.9–10.3)
Calcium: 8.7 mg/dL — ABNORMAL LOW (ref 8.9–10.3)
Calcium: 8.8 mg/dL — ABNORMAL LOW (ref 8.9–10.3)
Chloride: 117 mmol/L — ABNORMAL HIGH (ref 98–111)
Chloride: 117 mmol/L — ABNORMAL HIGH (ref 98–111)
Chloride: 118 mmol/L — ABNORMAL HIGH (ref 98–111)
Creatinine, Ser: 0.46 mg/dL — ABNORMAL LOW (ref 0.61–1.24)
Creatinine, Ser: 0.56 mg/dL — ABNORMAL LOW (ref 0.61–1.24)
Creatinine, Ser: 0.62 mg/dL (ref 0.61–1.24)
GFR calc Af Amer: >60 mL/min (ref >60)
GFR calc Af Amer: >60 mL/min (ref >60)
GFR calc Af Amer: >60 mL/min (ref >60)
GFR calc non Af Amer: >60 mL/min (ref >60)
GFR calc non Af Amer: >60 mL/min (ref >60)
GFR calc non Af Amer: >60 mL/min (ref >60)
Glucose, Bld: 131 mg/dL — ABNORMAL HIGH (ref 70–99)
Glucose, Bld: 141 mg/dL — ABNORMAL HIGH (ref 70–99)
Glucose, Bld: 162 mg/dL — ABNORMAL HIGH (ref 70–99)
Potassium: 3.8 mmol/L (ref 3.5–5.1)
Potassium: 3.9 mmol/L (ref 3.5–5.1)
Potassium: 4.5 mmol/L (ref 3.5–5.1)
Sodium: 146 mmol/L — ABNORMAL HIGH (ref 135–145)
Sodium: 148 mmol/L — ABNORMAL HIGH (ref 135–145)
Sodium: 149 mmol/L — ABNORMAL HIGH (ref 135–145)

## 2019-08-19 LAB — HEPATIC FUNCTION PANEL
ALT: 26 U/L (ref 0–44)
AST: 14 U/L — ABNORMAL LOW (ref 15–41)
Albumin: 2.7 g/dL — ABNORMAL LOW (ref 3.5–5.0)
Alkaline Phosphatase: 48 U/L (ref 38–126)
Bilirubin, Direct: <0.1 mg/dL (ref 0.0–0.2)
Total Bilirubin: 0.7 mg/dL (ref 0.3–1.2)
Total Protein: 5.4 g/dL — ABNORMAL LOW (ref 6.5–8.1)

## 2019-08-19 LAB — CBC WITH DIFFERENTIAL/PLATELET
Abs Immature Granulocytes: 1.01 10*3/uL — ABNORMAL HIGH (ref 0.00–0.07)
Basophils Absolute: 0.1 10*3/uL (ref 0.0–0.1)
Basophils Relative: 1 %
Eosinophils Absolute: 0.2 10*3/uL (ref 0.0–0.5)
Eosinophils Relative: 3 %
HCT: 30.4 % — ABNORMAL LOW (ref 39.0–52.0)
Hemoglobin: 9 g/dL — ABNORMAL LOW (ref 13.0–17.0)
Immature Granulocytes: 15 %
Lymphocytes Relative: 29 %
Lymphs Abs: 2 10*3/uL (ref 0.7–4.0)
MCH: 29.7 pg (ref 26.0–34.0)
MCHC: 29.6 g/dL — ABNORMAL LOW (ref 30.0–36.0)
MCV: 100.3 fL — ABNORMAL HIGH (ref 80.0–100.0)
Monocytes Absolute: 0.9 10*3/uL (ref 0.1–1.0)
Monocytes Relative: 12 %
Neutro Abs: 2.8 10*3/uL (ref 1.7–7.7)
Neutrophils Relative %: 40 %
Platelets: 69 10*3/uL — ABNORMAL LOW (ref 150–400)
RBC: 3.03 MIL/uL — ABNORMAL LOW (ref 4.22–5.81)
RDW: 20.1 % — ABNORMAL HIGH (ref 11.5–15.5)
WBC: 7 10*3/uL (ref 4.0–10.5)
nRBC: 1.6 % — ABNORMAL HIGH (ref 0.0–0.2)

## 2019-08-19 LAB — POCT I-STAT 7, (LYTES, BLD GAS, ICA,H+H)
Acid-base deficit: 5 mmol/L — ABNORMAL HIGH (ref 0.0–2.0)
Acid-base deficit: 4 mmol/L — ABNORMAL HIGH (ref 0.0–2.0)
Acid-base deficit: 3 mmol/L — ABNORMAL HIGH (ref 0.0–2.0)
Acid-base deficit: 3 mmol/L — ABNORMAL HIGH (ref 0.0–2.0)
Acid-base deficit: 6 mmol/L — ABNORMAL HIGH (ref 0.0–2.0)
Bicarbonate: 21.4 mmol/L (ref 20.0–28.0)
Bicarbonate: 22 mmol/L (ref 20.0–28.0)
Bicarbonate: 22.9 mmol/L (ref 20.0–28.0)
Bicarbonate: 22.6 mmol/L (ref 20.0–28.0)
Bicarbonate: 18.2 mmol/L — ABNORMAL LOW (ref 20.0–28.0)
Calcium, Ion: 1.28 mmol/L (ref 1.15–1.40)
Calcium, Ion: 1.32 mmol/L (ref 1.15–1.40)
Calcium, Ion: 1.3 mmol/L (ref 1.15–1.40)
Calcium, Ion: 1.31 mmol/L (ref 1.15–1.40)
Calcium, Ion: 1.17 mmol/L (ref 1.15–1.40)
HCT: 27 % — ABNORMAL LOW (ref 39.0–52.0)
HCT: 27 % — ABNORMAL LOW (ref 39.0–52.0)
HCT: 30 % — ABNORMAL LOW (ref 39.0–52.0)
HCT: 31 % — ABNORMAL LOW (ref 39.0–52.0)
HCT: 34 % — ABNORMAL LOW (ref 39.0–52.0)
Hemoglobin: 9.2 g/dL — ABNORMAL LOW (ref 13.0–17.0)
Hemoglobin: 9.2 g/dL — ABNORMAL LOW (ref 13.0–17.0)
Hemoglobin: 10.2 g/dL — ABNORMAL LOW (ref 13.0–17.0)
Hemoglobin: 10.5 g/dL — ABNORMAL LOW (ref 13.0–17.0)
Hemoglobin: 11.6 g/dL — ABNORMAL LOW (ref 13.0–17.0)
O2 Saturation: 86 %
O2 Saturation: 86 %
O2 Saturation: 83 %
O2 Saturation: 91 %
O2 Saturation: 94 %
Patient temperature: 36.6
Patient temperature: 36.3
Patient temperature: 36.6
Patient temperature: 36.6
Patient temperature: 98.5
Potassium: 3.7 mmol/L (ref 3.5–5.1)
Potassium: 3.7 mmol/L (ref 3.5–5.1)
Potassium: 3.7 mmol/L (ref 3.5–5.1)
Potassium: 4.5 mmol/L (ref 3.5–5.1)
Potassium: 3.3 mmol/L — ABNORMAL LOW (ref 3.5–5.1)
Sodium: 151 mmol/L — ABNORMAL HIGH (ref 135–145)
Sodium: 149 mmol/L — ABNORMAL HIGH (ref 135–145)
Sodium: 149 mmol/L — ABNORMAL HIGH (ref 135–145)
Sodium: 149 mmol/L — ABNORMAL HIGH (ref 135–145)
Sodium: 152 mmol/L — ABNORMAL HIGH (ref 135–145)
TCO2: 23 mmol/L (ref 22–32)
TCO2: 23 mmol/L (ref 22–32)
TCO2: 24 mmol/L (ref 22–32)
TCO2: 24 mmol/L (ref 22–32)
TCO2: 19 mmol/L — ABNORMAL LOW (ref 22–32)
pCO2 arterial: 41.7 mmHg (ref 32.0–48.0)
pCO2 arterial: 41.1 mmHg (ref 32.0–48.0)
pCO2 arterial: 40.7 mmHg (ref 32.0–48.0)
pCO2 arterial: 40.6 mmHg (ref 32.0–48.0)
pCO2 arterial: 32.6 mmHg (ref 32.0–48.0)
pH, Arterial: 7.315 — ABNORMAL LOW (ref 7.350–7.450)
pH, Arterial: 7.332 — ABNORMAL LOW (ref 7.350–7.450)
pH, Arterial: 7.356 (ref 7.350–7.450)
pH, Arterial: 7.351 (ref 7.350–7.450)
pH, Arterial: 7.355 (ref 7.350–7.450)
pO2, Arterial: 55 mmHg — ABNORMAL LOW (ref 83.0–108.0)
pO2, Arterial: 52 mmHg — ABNORMAL LOW (ref 83.0–108.0)
pO2, Arterial: 49 mmHg — ABNORMAL LOW (ref 83.0–108.0)
pO2, Arterial: 62 mmHg — ABNORMAL LOW (ref 83.0–108.0)
pO2, Arterial: 74 mmHg — ABNORMAL LOW (ref 83.0–108.0)

## 2019-08-19 LAB — TYPE AND SCREEN
ABO/RH(D): O POS
Antibody Screen: NEGATIVE
Unit division: 0
Unit division: 0
Unit division: 0
Unit division: 0
Unit division: 0
Unit division: 0
Unit division: 0
Unit division: 0

## 2019-08-19 LAB — CBC
HCT: 32.6 % — ABNORMAL LOW (ref 39.0–52.0)
Hemoglobin: 9.4 g/dL — ABNORMAL LOW (ref 13.0–17.0)
MCH: 28.7 pg (ref 26.0–34.0)
MCHC: 28.8 g/dL — ABNORMAL LOW (ref 30.0–36.0)
MCV: 99.4 fL (ref 80.0–100.0)
Platelets: 78 10*3/uL — ABNORMAL LOW (ref 150–400)
RBC: 3.28 MIL/uL — ABNORMAL LOW (ref 4.22–5.81)
RDW: 19.7 % — ABNORMAL HIGH (ref 11.5–15.5)
WBC: 6.4 10*3/uL (ref 4.0–10.5)
nRBC: 1.6 % — ABNORMAL HIGH (ref 0.0–0.2)

## 2019-08-19 LAB — GLUCOSE, CAPILLARY
Glucose-Capillary: 107 mg/dL — ABNORMAL HIGH (ref 70–99)
Glucose-Capillary: 114 mg/dL — ABNORMAL HIGH (ref 70–99)
Glucose-Capillary: 117 mg/dL — ABNORMAL HIGH (ref 70–99)
Glucose-Capillary: 123 mg/dL — ABNORMAL HIGH (ref 70–99)
Glucose-Capillary: 139 mg/dL — ABNORMAL HIGH (ref 70–99)
Glucose-Capillary: 141 mg/dL — ABNORMAL HIGH (ref 70–99)
Glucose-Capillary: 151 mg/dL — ABNORMAL HIGH (ref 70–99)

## 2019-08-19 LAB — BPAM RBC
Blood Product Expiration Date: 202102042359
Blood Product Expiration Date: 202102042359
Blood Product Expiration Date: 202102042359
Blood Product Expiration Date: 202102042359
Blood Product Expiration Date: 202102102359
Blood Product Expiration Date: 202102102359
Blood Product Expiration Date: 202102102359
Blood Product Expiration Date: 202102102359
ISSUE DATE / TIME: 202101042010
ISSUE DATE / TIME: 202101042010
ISSUE DATE / TIME: 202101042010
ISSUE DATE / TIME: 202101042010
ISSUE DATE / TIME: 202101081120
ISSUE DATE / TIME: 202101081120
ISSUE DATE / TIME: 202101081120
ISSUE DATE / TIME: 202101081120
Unit Type and Rh: 5100
Unit Type and Rh: 5100
Unit Type and Rh: 5100
Unit Type and Rh: 5100
Unit Type and Rh: 5100
Unit Type and Rh: 5100
Unit Type and Rh: 5100
Unit Type and Rh: 5100

## 2019-08-19 LAB — CULTURE, RESPIRATORY W GRAM STAIN

## 2019-08-19 LAB — PROTIME-INR
INR: 1.2 (ref 0.8–1.2)
Prothrombin Time: 15.1 seconds (ref 11.4–15.2)

## 2019-08-19 LAB — COOXEMETRY PANEL
Carboxyhemoglobin: 2.4 % — ABNORMAL HIGH (ref 0.5–1.5)
Methemoglobin: 0.6 % (ref 0.0–1.5)
O2 Saturation: 70.9 %
Total hemoglobin: 16.4 g/dL — ABNORMAL HIGH (ref 12.0–16.0)

## 2019-08-19 LAB — HEPARIN LEVEL (UNFRACTIONATED)
Heparin Unfractionated: 0.47 IU/mL (ref 0.30–0.70)
Heparin Unfractionated: 0.38 IU/mL (ref 0.30–0.70)

## 2019-08-19 LAB — VANCOMYCIN, TROUGH: Vancomycin Tr: 24 ug/mL (ref 15–20)

## 2019-08-19 LAB — APTT: aPTT: 90 seconds — ABNORMAL HIGH (ref 24–36)

## 2019-08-19 LAB — LACTIC ACID, PLASMA: Lactic Acid, Venous: 1.7 mmol/L (ref 0.5–1.9)

## 2019-08-19 LAB — LACTATE DEHYDROGENASE: LDH: 325 U/L — ABNORMAL HIGH (ref 98–192)

## 2019-08-19 LAB — TRIGLYCERIDES: Triglycerides: 308 mg/dL — ABNORMAL HIGH (ref <150)

## 2019-08-19 LAB — FIBRINOGEN: Fibrinogen: 460 mg/dL (ref 210–475)

## 2019-08-19 MED ORDER — DEXMEDETOMIDINE HCL IN NACL 400 MCG/100ML IV SOLN
0.4000 ug/kg/h | INTRAVENOUS | Status: DC
  Administered 2019-08-19: 0.4 ug/kg/h via INTRAVENOUS
  Administered 2019-08-19 (×2): 1.2 ug/kg/h via INTRAVENOUS
  Administered 2019-08-19: 0.7 ug/kg/h via INTRAVENOUS
  Administered 2019-08-19 – 2019-08-20 (×3): 1.2 ug/kg/h via INTRAVENOUS
  Administered 2019-08-20: 1.1 ug/kg/h via INTRAVENOUS
  Administered 2019-08-20 (×2): 1.2 ug/kg/h via INTRAVENOUS
  Administered 2019-08-20: 1 ug/kg/h via INTRAVENOUS
  Administered 2019-08-20 (×2): 1.2 ug/kg/h via INTRAVENOUS
  Administered 2019-08-20: 1 ug/kg/h via INTRAVENOUS
  Administered 2019-08-20: 1.2 ug/kg/h via INTRAVENOUS
  Filled 2019-08-19 (×5): qty 100
  Filled 2019-08-19: qty 200
  Filled 2019-08-19 (×8): qty 100

## 2019-08-19 MED ORDER — POTASSIUM CHLORIDE 20 MEQ/15ML (10%) PO SOLN
40.0000 meq | Freq: Once | ORAL | Status: AC
  Administered 2019-08-19: 40 meq
  Filled 2019-08-19: qty 30

## 2019-08-19 MED ORDER — DEXTROSE 5 % IV SOLN
250.0000 mg | Freq: Once | INTRAVENOUS | Status: AC
  Administered 2019-08-19: 250 mg via INTRAVENOUS
  Filled 2019-08-19: qty 250

## 2019-08-19 MED ORDER — FUROSEMIDE 10 MG/ML IJ SOLN
20.0000 mg | Freq: Once | INTRAMUSCULAR | Status: AC
  Administered 2019-08-19: 07:00:00 20 mg via INTRAVENOUS
  Filled 2019-08-19: qty 2

## 2019-08-19 MED ORDER — SODIUM CHLORIDE 0.9 % IV SOLN
0.5000 mg/h | INTRAVENOUS | Status: DC
  Administered 2019-08-19 – 2019-08-21 (×3): 12 mg/h via INTRAVENOUS
  Administered 2019-08-21: 21:00:00 10 mg/h via INTRAVENOUS
  Administered 2019-08-22: 13:00:00 20 mg/h via INTRAVENOUS
  Administered 2019-08-23: 09:00:00 14 mg/h via INTRAVENOUS
  Administered 2019-08-24: 11:00:00 18 mg/h via INTRAVENOUS
  Administered 2019-08-24: 16 mg/h via INTRAVENOUS
  Administered 2019-08-25 – 2019-09-03 (×23): 20 mg/h via INTRAVENOUS
  Administered 2019-09-04: 20:00:00 24 mg/h via INTRAVENOUS
  Administered 2019-09-04 – 2019-09-05 (×2): 20 mg/h via INTRAVENOUS
  Administered 2019-09-05: 24 mg/h via INTRAVENOUS
  Administered 2019-09-06 – 2019-09-07 (×2): 20 mg/h via INTRAVENOUS
  Administered 2019-09-08: 5 mg/h via INTRAVENOUS
  Administered 2019-09-09: 3 mg/h via INTRAVENOUS
  Administered 2019-09-10 (×2): 5 mg/h via INTRAVENOUS
  Administered 2019-09-11: 12 mg/h via INTRAVENOUS
  Administered 2019-09-12 – 2019-09-16 (×3): 5 mg/h via INTRAVENOUS
  Filled 2019-08-19 (×8): qty 20
  Filled 2019-08-19: qty 5
  Filled 2019-08-19 (×56): qty 20

## 2019-08-19 MED ORDER — VANCOMYCIN HCL IN DEXTROSE 1-5 GM/200ML-% IV SOLN
1000.0000 mg | Freq: Three times a day (TID) | INTRAVENOUS | Status: AC
  Administered 2019-08-19 – 2019-08-21 (×6): 1000 mg via INTRAVENOUS
  Filled 2019-08-19 (×6): qty 200

## 2019-08-19 NOTE — Progress Notes (Addendum)
ANTICOAGULATION and ANTIBIOTIC CONSULT NOTE  Pharmacy Consult for heparin; Vancomycin Indication: ECMO  No Known Allergies  Patient Measurements: Height: 5' 8"  (172.7 cm) Weight: 287 lb 14.7 oz (130.6 kg) IBW/kg (Calculated) : 68.4 Heparin Dosing Weight: 100.7 kg  Vital Signs: Temp: 98.6 F (37 C) (01/11 1600) Temp Source: Oral (01/11 1600) BP: 145/67 (01/11 1515) Pulse Rate: 62 (01/11 1900)  Labs: Recent Labs    08/16/19 2300 08/17/19 0400 08/17/19 0407 08/18/19 0414 08/18/19 1519 08/19/19 0439 08/19/19 0813 08/19/19 1600 08/19/19 1603 08/19/19 1929  HGB 10.2* 10.1*  --  9.2* 9.0* 9.0* 10.2* 9.4* 10.5*  --   HCT 30.0* 32.2*  --  30.6* 30.4* 30.4* 30.0* 32.6* 31.0*  --   PLT  --  48*   < > 61* 65* 69*  --  78*  --   --   APTT 95* 80*  --   --   --   --   --   --   --   --   LABPROT  --  14.2  --  14.4  --  15.1  --   --   --   --   INR  --  1.1  --  1.1  --  1.2  --   --   --   --   HEPARINUNFRC 0.46 0.27*   < > 0.40 0.51 0.47  --   --   --  0.38  CREATININE  --  0.50*   < > 0.51* 0.49* 0.56*  --  0.62  --   --    < > = values in this interval not displayed.    Estimated Creatinine Clearance: 153.9 mL/min (by C-G formula based on SCr of 0.62 mg/dL).  Assessment: 44 yo male known COVID+ and ARDS VV ECMO. Heparin was restarted 1/5 (bivalirudin stopped as HIT test was negative).  H/H stable, pltc remain low but stable. Heparin level therapeutic at 0.38 - trending down slightly (aiming for upper end of range after circuit change 1/8).  APTT slightly above goal 90 - likely influenced by volume or coagulopathy. Notes of oozing from nose and trach site prior but none at this time. No issues with ECMO circuit at this time per RN.   Vancomycin trough level elevated at 24 on 1250 mg IV every 8 hours. Plan to continue therapy through 1/14.Remains on CRRT and ECMO. Will reduce dose and delay next dose to 0000AM.    Goal of Therapy:  Heparin level: 0.3-0.5 APTT goal: 66-85  sec Monitor platelets by anticoagulation protocol: Yes   Plan:  -Continue IV heparin 1700 units/hr. -Recheck aPTT/Heparin level q12h -Monitor s/sx of bleeding  -Reduce Vancomycin to 1000 mg IV every 8 hours - delay next dose until 0000 AM on 08/20/19.  -Monitor CRRT   Sloan Leiter, PharmD, BCPS, BCCCP Clinical Pharmacist Please refer to [facility] for [facility] Pharmacy numbers 08/19/2019

## 2019-08-19 NOTE — Progress Notes (Signed)
Asistted with bronch this AM with Dr. Tamala Julian. Sample tubed to lab, checked lid to make sure it was tight. Lab notified RN that sample had exploded in tube. RN called me and I called Dr. Tamala Julian myself. Stated to either try to send aspirate if able to obtain, or we will just send new sample when bronched in the morning.

## 2019-08-19 NOTE — Procedures (Signed)
Bronchoscopy  Indication: Abnormal CXR  Consent: Signed in chart  Anesthesia: In place for ECMO  Procedure - Timeout performed - Bronchoscope advanced through ETT - Findings as below - BAL sent from LUL and RML  Findings - Completely occlusive blood clots found in bilateral mainstem bronchi requiring copious irrigation and suctioning - After these blood clots were suctioned, there was purulent material worse on left occluding segmental bronchi  Specimen(s): BAL of combined RML and LUL  Complications: none immediate  Tentative plan to repeat bronch tomorrow No need for CT chest given findings on bronch

## 2019-08-19 NOTE — Progress Notes (Signed)
NAME:  [name], MRN:  [id number], DOB:  06-Jan-1976, LOS: 25 ADMISSION DATE:  07/22/2019, CONSULTATION DATE:  12/24 REFERRING MD:  Sloan Leiter, CHIEF COMPLAINT:  Dyspnea   Brief History   44 y/o M admitted 12/14 with COVID pneumonia causing acute hypoxemic respiratory failure. Developed pneumomediastinum 12/23 with concern for possible bilateral small pneumothoraces on 12/24.  PCCM consulted for evaluation of pneumothoraces  Past Medical History  GERD HTN Asthma  Significant Hospital Events   12/14 admit with hypoxemic respiratory failure in setting of COVID-19 pneumonia 12/23 noted to have pneumomediastinum on chest x-ray 12/24 PCCM consulted for evaluation 12/26 worsening hypoxemia, intubated  12/27 VV fem/fem ECMO cannulation 12/28 ETT switch 12/29 Tracheostomy 12/31 Oozing from multiple sites, H/H drop. Heparin held briefly and lidocaine with epi injected around trach site, Cryo, DDVAP given 1/3 CVL being discontinued  1/5: repacked nares 1/7: repacked nares by ENT 1/8: trach exchange distal XLT  Consults:  PCCM  Procedures:  1/4 bronch: bal performed  Significant Diagnostic Tests:  CT chest 12/22 >> extensive pneumomediastinum, multifocal patchy bilateral groundglass opacities  Micro Data:  BCx2 12/14 >> negative  Sputum 12/15 >> negative  1/4 bronch:  1/2 acid fast smear bronch: negative 1/2 fungal cx bronch: pending  Antimicrobials:  Received remdesivir, steroids, actemra and convalescent plasma  5 days cefepime 12/24-12/29 Cefepime 1/4-> vanc 1/4->   Interim history/subjective:  Still sedated/paralyzed.  Labs/imaging stable.  Objective   Blood pressure (!) 144/57, pulse 68, temperature 98.3 F (36.8 C), temperature source Core, resp. rate (!) 40, height _0  (1.727 m), weight 130.6 kg, SpO2 (!) 88 %. CVP:  [8 mmHg-14 mmHg] 11 mmHg  Vent Mode: PCV FiO2 (%):  [50 %-80 %] 50 % Set Rate:  [20 bmp] 20 bmp PEEP:  [12 cmH20] 12 cmH20 Plateau Pressure:   [24 cmH20-27 cmH20] 27 cmH20   Intake/Output Summary (Last 24 hours) at 08/19/2019 0715 Last data filed at 08/19/2019 0600 Gross per 24 hour  Intake 7548.65 ml  Output 6285 ml  Net 1263.65 ml   Filed Weights   08/17/19 0328 08/18/19 0350 08/19/19 0457  Weight: 128 kg 128.7 kg 130.6 kg   Examination GEN: obese ill man on vent HEENT: trach in place, small bloody sedations, nasal packing in place, no active bleeding CV: RRR, ext warm PULM: severely diminished but present bilaterally GI: Soft, + BS EXT: Global anascarca NEURO: sedated/paralyzed PSYCH: RASS -4 SKIN: pale Femoral cannulas in place, sites look okay Bilateral chest tubes in place, no tidaling or air leak Foley in place  Resolved Hospital Problem list    Assessment & Plan:    Critically ill due to acute hypoxemic respiratory failure with ARDS secondary to COVID-19, s/p tracheostomy requiring mechanical ventilation and VV ECMO support. Continue ECMO at current flow rate.  Bronchoscopy for secretion clearance as needed: will try today, if this and lung US showing nothing will  Continue to follow Vt on PC. Keep Driving pressure 15 of less. Low dose lasix and diuril x 1 Continues to desaturate with minimal stimulation.  Continue current level enteral sedation.  Continue sedative infusions and paralytic.  Adding precedex, trying to wean propofol if we can, watch TG closely, may need to consider phenobarbital if all else fails Premedicate with Dilaudid prior to turns. Heparin with 0.3-0.5 goal   Staph epi and serratia marcescens pneumonia:  cefepime and vancomycin for 7 days  Bilateral pneumothoraces and pneumomediastinum R with some leak (likely where loss of tv is) Continue suction  -  20cmH2O  Posterior epistaxis.  repacked 1/7 after discussion with ENT  Continue antibiotics (on for above pna) while packing in place for TSS prevention (ceftriaxone otherwise) Will need unpacking 1/12 and repacking PRN   Daily  Goals Checklist  Pain/Anxiety/Delirium protocol (if indicated): morphine and valium seroquel enterally. Dilaudid, propofol, precedex, versed infusions. VAP protocol (if indicated): Bundle in place. Respiratory support goals: Continue PCV with ultra-lung protective ventilation  Blood pressure target: On NE to keep MAP>65  DVT prophylaxis: on systemic anticoagulation on VV-ECMO -  Nutrition Status: Nutrition Problem: Increased nutrient needs Etiology: acute illness(COVID PNA) Signs/Symptoms: estimated needs Interventions: Tube feeding, Prostat GI prophylaxis: Protonix per tube. Fluid status goals: diuresis to achieve - 1 to -1.5. (still net even by I/O) Urinary catheter: Guide hemodynamic management Glucose control: euglycemic on SSI. Mobility/therapy needs: Bedrest.  Antibiotic de-escalation: complete 7 days of antibiotics for pneumonia. Home medication reconciliation: reviewed. Daily labs: as per ECMO protocol. Code Status: Full code  Family Communication: will try to reach out today Disposition: ICU   Labs   CBC: Recent Labs  Lab 08/16/19 1317 08/16/19 1404 08/17/19 0400 08/17/19 1607 08/18/19 0414 08/18/19 1519 08/18/19 1619 08/18/19 2150 08/19/19 0439 08/19/19 0447 08/19/19 0609  WBC 7.3   < > 8.3 6.7 7.2 6.3  --   --  7.0  --   --   NEUTROABS 3.9  --  3.6  --  2.9  --   --   --  2.8  --   --   HGB 8.8*  --  10.1* 9.4* 9.2* 9.0* 9.2* 8.8* 9.0* 9.2* 9.2*  HCT 29.5*  --  32.2* 30.6* 30.6* 30.4* 27.0* 26.0* 30.4* 27.0* 27.0*  MCV 101.0*   < > 95.5 96.5 97.1 100.3*  --   --  100.3*  --   --   PLT 41*   < > 48* 53* 61* 65*  --   --  69*  --   --    < > = values in this interval not displayed.    Basic Metabolic Panel: Recent Labs  Lab 08/17/19 0400 08/17/19 1607 08/18/19 0414 08/18/19 1519 08/18/19 1619 08/18/19 2150 08/19/19 0439 08/19/19 0447 08/19/19 0609  NA 143 144 146* 146* 150* 150* 149* 151* 149*  K 4.0 4.5 4.1 4.1 4.2 4.0 3.9 3.7 3.7  CL 110 113*  115* 117*  --   --  118*  --   --   CO2 _0 --   --  21*  --   --   GLUCOSE 125* 205* 132* 151*  --   --  131*  --   --   BUN 39* 34* 33* 32*  --   --  34*  --   --   CREATININE 0.50* 0.59* 0.51* 0.49*  --   --  0.56*  --   --   CALCIUM 8.7* 8.5* 8.6* 8.6*  --   --  8.7*  --   --    GFR: Estimated Creatinine Clearance: 153.9 mL/min (A) (by C-G formula based on SCr of 0.56 mg/dL (L)). Recent Labs  Lab 08/16/19 0453 08/17/19 0400 08/17/19 1607 08/18/19 0414 08/18/19 1519 08/19/19 0439  WBC 7.7 8.3 6.7 7.2 6.3 7.0  LATICACIDVEN 1.3 1.6  --  2.0*  --  1.7    Liver Function Tests: Recent Labs  Lab 08/15/19 0433 08/16/19 0453 08/17/19 0400 08/18/19 0414 08/19/19 0439  AST _1 13* 14*  ALT 29 24 40  31 26  ALKPHOS 46 47 52 49 48  BILITOT 0.9 1.0 0.8 0.4 0.7  PROT 5.1* 5.2* 5.4* 5.2* 5.4*  ALBUMIN 2.9* 3.0* 3.2* 2.7* 2.7*   No results for input(s): LIPASE, AMYLASE in the last 168 hours. No results for input(s): AMMONIA in the last 168 hours.  ABG    Component Value Date/Time   PHART 7.332 (L) 08/19/2019 0609   PCO2ART 41.1 08/19/2019 0609   PO2ART 52.0 (L) 08/19/2019 0609   HCO3 22.0 08/19/2019 0609   TCO2 23 08/19/2019 0609   ACIDBASEDEF 4.0 (H) 08/19/2019 0609   O2SAT 86.0 08/19/2019 0609     Coagulation Profile: Recent Labs  Lab 08/15/19 0433 08/16/19 0453 08/17/19 0400 08/18/19 0414 08/19/19 0439  INR 1.1 1.1 1.1 1.1 1.2    Cardiac Enzymes: No results for input(s): CKTOTAL, CKMB, CKMBINDEX, TROPONINI in the last 168 hours.  HbA1C: Hgb A1c MFr Bld  Date/Time Value Ref Range Status  07/24/2019 04:50 AM 6.7 (H) 4.8 - 5.6 % Final    Comment:    (NOTE) Pre diabetes:          5.7%-6.4% Diabetes:              >6.4% Glycemic control for   <7.0% adults with diabetes   09/30/2017 05:36 AM 5.7 (H) 4.8 - 5.6 % Final    Comment:    (NOTE)         Prediabetes: 5.7 - 6.4         Diabetes: >6.4         Glycemic control for adults with  diabetes: <7.0     CBG: Recent Labs  Lab 08/18/19 0734 08/18/19 1203 08/18/19 2028 08/19/19 0008 08/19/19 0444  GLUCAP 134* 131* 118* 114* 123*     Critical care time:     CRITICAL CARE   The patient is critically ill with multiple organ systems failure and requires high complexity decision making for assessment and support, frequent evaluation and titration of therapies, application of advanced monitoring technologies and extensive interpretation of multiple databases. Critical Care Time devoted to patient care services described in this note independent of APP/resident time (if applicable)  is 35 minutes.   Erskine Emery MD [facility] 08/19/2019 7:30 AM Personal pager: [phone] If unanswered, please page CCM On-call: [phone]    08/19/2019, 7:15 AM

## 2019-08-19 NOTE — Progress Notes (Signed)
ANTICOAGULATION CONSULT NOTE  Pharmacy Consult for heparin Indication: ECMO  No Known Allergies  Patient Measurements: Height: 5' 8"  (172.7 cm) Weight: 287 lb 14.7 oz (130.6 kg) IBW/kg (Calculated) : 68.4 Heparin Dosing Weight: 100.7 kg  Vital Signs: Temp: 98.3 F (36.8 C) (01/10 2000) Temp Source: Core (01/10 2000) Pulse Rate: 68 (01/11 0600)  Labs: Recent Labs    08/16/19 1657 08/16/19 2300 08/17/19 0400 08/17/19 0407 08/18/19 0414 08/18/19 1519 08/19/19 0439 08/19/19 0447 08/19/19 0609  HGB 9.7* 10.2* 10.1*  --  9.2* 9.0* 9.0* 9.2* 9.2*  HCT 30.9* 30.0* 32.2*  --  30.6* 30.4* 30.4* 27.0* 27.0*  PLT 43*  --  48*   < > 61* 65* 69*  --   --   APTT 121* 95* 80*  --   --   --   --   --   --   LABPROT  --   --  14.2  --  14.4  --  15.1  --   --   INR  --   --  1.1  --  1.1  --  1.2  --   --   HEPARINUNFRC 0.58 0.46 0.27*   < > 0.40 0.51 0.47  --   --   CREATININE 0.53*  --  0.50*   < > 0.51* 0.49* 0.56*  --   --    < > = values in this interval not displayed.    Estimated Creatinine Clearance: 153.9 mL/min (A) (by C-G formula based on SCr of 0.56 mg/dL (L)).  Assessment: 44 yo male known COVID+ and ARDS VV ECMO. Heparin was restarted 1/5 (bivalirudin stopped as HIT test was negative).  H/H stable, pltc remain low but stable. Heparin level therapeutic at 0.47.   Goal of Therapy:  Heparin level: 0.3-0.5 APTT goal: 66-85 sec Monitor platelets by anticoagulation protocol: Yes   Plan:  -Continue IV heparin 1700 units/hr. -Recheck aPTT/Heparin level q12h -Monitor s/sx of bleeding   Arrie Senate, PharmD, BCPS Clinical Pharmacist [phone] Please check [facility] for all [facility] Pharmacy numbers 08/19/2019

## 2019-08-19 NOTE — Progress Notes (Signed)
Patient ID: [name], male   DOB: May 19, 1976, 44 y.o.   MRN: [id number]    Advanced Heart Failure Rounding Note   Subjective:    Remains on VV ecmo with good flows. Remains sedated and paralyzed.   Circuit changed and trach exchanged on 1/8.   Remains intubated/sedated/paralyzed.   Last night sats were running lower. Got 20 IV lasix with good output but still +  ABG 7.33/41/652/86%  CXR: Bilateral lung opacities c/w ARDS - worse today. Personally reviewed    On vent 50% PEEP 12. PlatP 27 Driving pressure 15 No change. TV ~ 50-60 on PC. Reviewed personally   ECMO parameters Flow 5.53 RPM 3800 dP 33 Pressure -113  Objective:   Weight Range:  Vital Signs:   Temp:  [97.3 F (36.3 C)-98.3 F (36.8 C)] 98.3 F (36.8 C) (01/10 2000) Pulse Rate:  [65-76] 68 (01/11 0600) Resp:  [33-75] 40 (01/11 0600) SpO2:  [83 %-93 %] 88 % (01/11 0600) Arterial Line BP: (109-149)/(41-62) 118/49 (01/11 0600) FiO2 (%):  [50 %-80 %] 50 % (01/11 0200) Weight:  [130.6 kg] 130.6 kg (01/11 0457) Last BM Date: 08/18/19(flexiseal)  Weight change: Filed Weights   08/17/19 0328 08/18/19 0350 08/19/19 0457  Weight: 128 kg 128.7 kg 130.6 kg    Intake/Output:   Intake/Output Summary (Last 24 hours) at 08/19/2019 0735 Last data filed at 08/19/2019 0600 Gross per 24 hour  Intake 7548.65 ml  Output 6285 ml  Net 1263.65 ml     Physical Exam: General:  Sedated on vent HEENT: normal + nasal packing swollen Cor: PMI nondisplaced. Regular rate & rhythm. No rubs, gallops or murmurs. Lungs: minimal sir movement Abdomen: obesesoft, nontender, nondistended. No hepatosplenomegaly. No bruits or masses. Good bowel sounds. Extremities: no cyanosis, clubbing, rash, 2+ edema cannulas ok  Neuro:sedated paralyzed   Telemetry: Sinus 60-70s Personally reviewed  Labs: Basic Metabolic Panel: Recent Labs  Lab 08/17/19 0400 08/17/19 1607 08/18/19 0414 08/18/19 1519 08/18/19 1619 08/18/19 2150  08/19/19 0439 08/19/19 0447 08/19/19 0609  NA 143 144 146* 146* 150* 150* 149* 151* 149*  K 4.0 4.5 4.1 4.1 4.2 4.0 3.9 3.7 3.7  CL 110 113* 115* 117*  --   --  118*  --   --   CO2 23 22 22 22   --   --  21*  --   --   GLUCOSE 125* 205* 132* 151*  --   --  131*  --   --   BUN 39* 34* 33* 32*  --   --  34*  --   --   CREATININE 0.50* 0.59* 0.51* 0.49*  --   --  0.56*  --   --   CALCIUM 8.7* 8.5* 8.6* 8.6*  --   --  8.7*  --   --     Liver Function Tests: Recent Labs  Lab 08/15/19 0433 08/16/19 0453 08/17/19 0400 08/18/19 0414 08/19/19 0439  AST 18 22 21  13* 14*  ALT 29 24 40 31 26  ALKPHOS 46 47 52 49 48  BILITOT 0.9 1.0 0.8 0.4 0.7  PROT 5.1* 5.2* 5.4* 5.2* 5.4*  ALBUMIN 2.9* 3.0* 3.2* 2.7* 2.7*   No results for input(s): LIPASE, AMYLASE in the last 168 hours. No results for input(s): AMMONIA in the last 168 hours.  CBC: Recent Labs  Lab 08/16/19 1317 08/16/19 1404 08/17/19 0400 08/17/19 1607 08/18/19 0414 08/18/19 1519 08/18/19 1619 08/18/19 2150 08/19/19 0439 08/19/19 0447 08/19/19 0609  WBC 7.3   < >  8.3 6.7 7.2 6.3  --   --  7.0  --   --   NEUTROABS 3.9  --  3.6  --  2.9  --   --   --  2.8  --   --   HGB 8.8*  --  10.1* 9.4* 9.2* 9.0* 9.2* 8.8* 9.0* 9.2* 9.2*  HCT 29.5*  --  32.2* 30.6* 30.6* 30.4* 27.0* 26.0* 30.4* 27.0* 27.0*  MCV 101.0*   < > 95.5 96.5 97.1 100.3*  --   --  100.3*  --   --   PLT 41*   < > 48* 53* 61* 65*  --   --  69*  --   --    < > = values in this interval not displayed.    Cardiac Enzymes: No results for input(s): CKTOTAL, CKMB, CKMBINDEX, TROPONINI in the last 168 hours.  BNP: BNP (last 3 results) Recent Labs    07/22/19 1039  BNP 15.3    ProBNP (last 3 results) No results for input(s): PROBNP in the last 8760 hours.    Other results:  Imaging: DG CHEST PORT 1 VIEW  Result Date: 08/19/2019 CLINICAL DATA:  Pneumothorax and chest tube EXAM: PORTABLE CHEST 1 VIEW COMPARISON:  Two days ago FINDINGS: Bilateral chest  tube in place with no visible pneumothorax. Right PICC with tip at the SVC. Tracheostomy tube in place. Feeding tube which at least reaches the stomach. Large bore cannula from below with tip near the diaphragm. Lungs have become airless again. Obscured heart. IMPRESSION: 1. Unremarkable hardware positioning. 2. No visible pneumothorax.  The lungs have become airless. Electronically Signed   By: Monte Fantasia M.D.   On: 08/19/2019 07:18   DG CHEST PORT 1 VIEW  Result Date: 08/17/2019 CLINICAL DATA:  44 year old male with hypoxia. EXAM: PORTABLE CHEST 1 VIEW COMPARISON:  Earlier chest radiograph dated 08/17/2019. FINDINGS: Partially visualized endotracheal tube with tip approximately 3.3 cm above the carina. Feeding tube extends below the diaphragm with tip beyond the inferior margin of the image. Bilateral chest tubes in similar position. Right-sided PICC and a cannula over the expected location of the IVC similar to prior radiograph. Shallow inspiration with extensive bilateral airspace opacities without significant interval change. The cardiac borders are silhouetted. No pneumothorax. No acute osseous pathology. IMPRESSION: 1. Shallow inspiration with extensive bilateral airspace opacity without significant interval change. 2. Support apparatus in stable position. Electronically Signed   By: Anner Crete M.D.   On: 08/17/2019 23:54     Medications:     Scheduled Medications: . artificial tears  1 application Both Eyes G2X  . vitamin C  500 mg Per Tube Daily  . chlorhexidine gluconate (MEDLINE KIT)  15 mL Mouth Rinse BID  . Chlorhexidine Gluconate Cloth  6 each Topical Daily  . dexamethasone (DECADRON) injection  1 mg Intravenous Q24H  . diazepam  20 mg Oral Q6H  . feeding supplement (PRO-STAT SUGAR FREE 64)  60 mL Per Tube BID  . free water  300 mL Per Tube Q8H  . gabapentin  300 mg Per Tube Q8H  . HYDROmorphone  8 mg Per Tube Q4H  . insulin aspart  3-9 Units Subcutaneous Q4H  . mouth  rinse  15 mL Mouth Rinse 10 times per day  . oxymetazoline  1 spray Each Nare BID  . pantoprazole sodium  40 mg Per Tube QHS  . QUEtiapine  100 mg Per Tube BID  . sodium chloride flush  10-40 mL Intracatheter  Q12H  . sodium chloride flush  10-40 mL Intracatheter Q12H  . Thrombi-Pad  1 each Topical Once  . zinc sulfate  220 mg Per Tube Daily    Infusions: . sodium chloride    . sodium chloride Stopped (08/17/19 1742)  . albumin human 25 g (08/18/19 0728)  . ceFEPime (MAXIPIME) IV Stopped (08/19/19 0054)  . chlorothiazide (DIURIL) IV    . cisatracurium (NIMBEX) infusion 10 mcg/kg/min (08/19/19 0600)  . dexmedetomidine (PRECEDEX) IV infusion    . dextrose    . feeding supplement (PIVOT 1.5 CAL) 1,000 mL (08/19/19 0032)  . heparin 1,700 Units/hr (08/19/19 0600)  . HYDROmorphone 12 mg/hr (08/19/19 0600)  . midazolam (VERSED) infusion 40 mg/hr (08/19/19 0600)  . norepinephrine (LEVOPHED) Adult infusion 1.5 mcg/min (08/19/19 0600)  . propofol (DIPRIVAN) infusion 80 mcg/kg/min (08/19/19 0610)  . vancomycin Stopped (08/19/19 0233)    PRN Medications: Place/Maintain arterial line **AND** sodium chloride, sodium chloride, acetaminophen (TYLENOL) oral liquid 160 mg/5 mL, acetaminophen, albumin human, dextrose, hydrALAZINE, HYDROmorphone, labetalol, magnesium hydroxide, metoprolol tartrate, midazolam, ondansetron (ZOFRAN) IV, senna-docusate, sodium chloride, sodium chloride flush, sodium chloride flush, sodium chloride flush   Assessment/Plan:    1. Acute hypoxic/hypercarbic respiratory failure due to COVID PNA: Remained markedly hypoxic despite full support. Intubated 12/26. S/p bilateral CTs 12/26 for pneumothoraces.  TEE on 12/26 LVEF 70% RV ok. VV ECMO begun 12/26. Day #9 VV ECMO. Tracheostomy 12/29.  COVID ARDS at this point.  Flows stable and sats improved with deeper sedation and re-paralysis. Ecmo circuit changed 1/8 - ECMO parameters stable Sats dropped yesterday. FiO2 up to 80% but  now back down to 50% - Remains sedated/paralyzed. He has failed wean of sedatives MULTIPLE times. Would not try this again until we see evidence of lung recovery with improving tidal volumes - Cannula position reviewed on CXR with CCM. Stable - Volume status up  Will give lasix again today - With worsening CXR and TVs  will plan bronch with CCM today and diuresis - Bronch 1/8. Minimal secretions - Sats running low 90s TVs 70 -> 100 -> 50-60. Driving pressure 88-91 range. Continue to support with PC. CXR worse. Continue PS. Suspect TVs will slowly trend up as lung complaince improves. As above. Do NOT wean sedation until we have signs of lung recovery. Bronch and lasix today.  - Continue lung rest.  - PLTs 61k -> 69k this am. LDH 325 - Lactic acid down. Co-ox ok.    2. Bilateral PTX: Due to barotrauma.  - Driving pressure minimized at 12-13. Stable this am No change  3. COVID PNA: management of COVID infection per CCM. CXR with bilateral multifocal PNA progressing to likely ARDS.  - s/p 10 days of remdesivir - 12/16, 12/2 s/p tociluzimab - 12/17 s/p convalescent plasma - On decadron at 56m/daily until 1/11 - On cefepime and vanc - see below. Discussed dosing with PharmD personally.Continue course  4. ID: Empiric coverage with vancomycin/cefepime initially for possible secondary bacterial PNA, course completed.   - now back on cefepime and vanc for + cx with staph epidimidis, MSSA and serratia (sensitivities ok).  - Remains stable - Reviewed with PharmD this am   5. Thrombocytopenia: Slow decrease in platelets, likely low level hemolysis + critical illness.  - switched to bival on 1/4. HIT negative - 61k ths am.  - Back on heparin. Heparin level ok at 0.47.  Dosing discussed with Pharm  6. Anemia: Bleeding at trach site resolved but still bleeding at right chest  tube site and CVL. - hgb 10.2 -> 9.2 -> 9.0 - Transfuse hgb < 8   7. FEN - continue TFs  D/w with ECMO team and CCM at  bedside on multidisciplinary rounds. Continue to await ARDS recovery. Details as above.   Length of Stay: 28   CRITICAL CARE Performed by: Glori Bickers  Total critical care time: 35 minutes  Critical care time was exclusive of separately billable procedures and treating other patients.  Critical care was necessary to treat or prevent imminent or life-threatening deterioration.  Critical care was time spent personally by me on the following activities: development of treatment plan with patient and/or surrogate as well as nursing, discussions with consultants, evaluation of patient's response to treatment, examination of patient, obtaining history from patient or surrogate, ordering and performing treatments and interventions, ordering and review of laboratory studies, ordering and review of radiographic studies, pulse oximetry and re-evaluation of patient's condition.    Glori Bickers MD 08/19/2019, 7:35 AM  Advanced Heart Failure Team Pager [phone] (M-F; 7a - 4p)  Please contact [facility] for night-coverage after hours (4p -7a ) and weekends on [email or web address]

## 2019-08-19 NOTE — Progress Notes (Signed)
SLP Cancellation Note  Patient Details Name: [name] MRN: [id number] DOB: September 22, 1975   Cancelled treatment:       Reason Eval/Treat Not Completed: Patient not medically ready - will continue to follow intermittently for PMV readiness.     [name]., M.A. [facility] Pager [phone] Office [phone]  08/19/2019, 7:58 AM

## 2019-08-20 ENCOUNTER — Other Ambulatory Visit: Payer: Self-pay | Admitting: *Deleted

## 2019-08-20 ENCOUNTER — Inpatient Hospital Stay (HOSPITAL_COMMUNITY): Payer: Managed Care, Other (non HMO)

## 2019-08-20 ENCOUNTER — Telehealth: Payer: Self-pay | Admitting: *Deleted

## 2019-08-20 DIAGNOSIS — R4589 Other symptoms and signs involving emotional state: Secondary | ICD-10-CM

## 2019-08-20 LAB — GLUCOSE, CAPILLARY
Glucose-Capillary: 129 mg/dL — ABNORMAL HIGH (ref 70–99)
Glucose-Capillary: 118 mg/dL — ABNORMAL HIGH (ref 70–99)
Glucose-Capillary: 111 mg/dL — ABNORMAL HIGH (ref 70–99)
Glucose-Capillary: 141 mg/dL — ABNORMAL HIGH (ref 70–99)
Glucose-Capillary: 104 mg/dL — ABNORMAL HIGH (ref 70–99)

## 2019-08-20 LAB — CBC WITH DIFFERENTIAL/PLATELET
Abs Immature Granulocytes: 0.81 10*3/uL — ABNORMAL HIGH (ref 0.00–0.07)
Basophils Absolute: 0.1 10*3/uL (ref 0.0–0.1)
Basophils Relative: 1 %
Eosinophils Absolute: 0.1 10*3/uL (ref 0.0–0.5)
Eosinophils Relative: 3 %
HCT: 31.7 % — ABNORMAL LOW (ref 39.0–52.0)
Hemoglobin: 9.5 g/dL — ABNORMAL LOW (ref 13.0–17.0)
Immature Granulocytes: 15 %
Lymphocytes Relative: 35 %
Lymphs Abs: 2 10*3/uL (ref 0.7–4.0)
MCH: 29.5 pg (ref 26.0–34.0)
MCHC: 30 g/dL (ref 30.0–36.0)
MCV: 98.4 fL (ref 80.0–100.0)
Monocytes Absolute: 0.6 10*3/uL (ref 0.1–1.0)
Monocytes Relative: 12 %
Neutro Abs: 1.9 10*3/uL (ref 1.7–7.7)
Neutrophils Relative %: 34 %
Platelets: 85 10*3/uL — ABNORMAL LOW (ref 150–400)
RBC: 3.22 MIL/uL — ABNORMAL LOW (ref 4.22–5.81)
RDW: 19.4 % — ABNORMAL HIGH (ref 11.5–15.5)
WBC: 5.6 10*3/uL (ref 4.0–10.5)
nRBC: 1.6 % — ABNORMAL HIGH (ref 0.0–0.2)

## 2019-08-20 LAB — HEPATIC FUNCTION PANEL
ALT: 21 U/L (ref 0–44)
AST: 13 U/L — ABNORMAL LOW (ref 15–41)
Albumin: 2.6 g/dL — ABNORMAL LOW (ref 3.5–5.0)
Alkaline Phosphatase: 46 U/L (ref 38–126)
Bilirubin, Direct: 0.1 mg/dL (ref 0.0–0.2)
Indirect Bilirubin: 0.6 mg/dL (ref 0.3–0.9)
Total Bilirubin: 0.7 mg/dL (ref 0.3–1.2)
Total Protein: 5.6 g/dL — ABNORMAL LOW (ref 6.5–8.1)

## 2019-08-20 LAB — BASIC METABOLIC PANEL
Anion gap: 9 (ref 5–15)
Anion gap: 6 (ref 5–15)
BUN: 33 mg/dL — ABNORMAL HIGH (ref 6–20)
BUN: 36 mg/dL — ABNORMAL HIGH (ref 6–20)
CO2: 23 mmol/L (ref 22–32)
CO2: 25 mmol/L (ref 22–32)
Calcium: 8.7 mg/dL — ABNORMAL LOW (ref 8.9–10.3)
Calcium: 8.7 mg/dL — ABNORMAL LOW (ref 8.9–10.3)
Chloride: 114 mmol/L — ABNORMAL HIGH (ref 98–111)
Chloride: 115 mmol/L — ABNORMAL HIGH (ref 98–111)
Creatinine, Ser: 0.61 mg/dL (ref 0.61–1.24)
Creatinine, Ser: 0.41 mg/dL — ABNORMAL LOW (ref 0.61–1.24)
GFR calc Af Amer: >60 mL/min (ref >60)
GFR calc Af Amer: >60 mL/min (ref >60)
GFR calc non Af Amer: >60 mL/min (ref >60)
GFR calc non Af Amer: >60 mL/min (ref >60)
Glucose, Bld: 135 mg/dL — ABNORMAL HIGH (ref 70–99)
Glucose, Bld: 132 mg/dL — ABNORMAL HIGH (ref 70–99)
Potassium: 4.3 mmol/L (ref 3.5–5.1)
Potassium: 3.7 mmol/L (ref 3.5–5.1)
Sodium: 146 mmol/L — ABNORMAL HIGH (ref 135–145)
Sodium: 146 mmol/L — ABNORMAL HIGH (ref 135–145)

## 2019-08-20 LAB — HEPARIN LEVEL (UNFRACTIONATED)
Heparin Unfractionated: 0.28 IU/mL — ABNORMAL LOW (ref 0.30–0.70)
Heparin Unfractionated: 0.27 IU/mL — ABNORMAL LOW (ref 0.30–0.70)
Heparin Unfractionated: 0.47 IU/mL (ref 0.30–0.70)
Heparin Unfractionated: 0.24 IU/mL — ABNORMAL LOW (ref 0.30–0.70)

## 2019-08-20 LAB — POCT I-STAT 7, (LYTES, BLD GAS, ICA,H+H)
Acid-Base Excess: 1 mmol/L (ref 0.0–2.0)
Bicarbonate: 25.3 mmol/L (ref 20.0–28.0)
Bicarbonate: 25.2 mmol/L (ref 20.0–28.0)
Bicarbonate: 24.4 mmol/L (ref 20.0–28.0)
Calcium, Ion: 1.29 mmol/L (ref 1.15–1.40)
Calcium, Ion: 1.32 mmol/L (ref 1.15–1.40)
Calcium, Ion: 1.29 mmol/L (ref 1.15–1.40)
HCT: 29 % — ABNORMAL LOW (ref 39.0–52.0)
HCT: 34 % — ABNORMAL LOW (ref 39.0–52.0)
HCT: 30 % — ABNORMAL LOW (ref 39.0–52.0)
Hemoglobin: 9.9 g/dL — ABNORMAL LOW (ref 13.0–17.0)
Hemoglobin: 11.6 g/dL — ABNORMAL LOW (ref 13.0–17.0)
Hemoglobin: 10.2 g/dL — ABNORMAL LOW (ref 13.0–17.0)
O2 Saturation: 93 %
O2 Saturation: 95 %
O2 Saturation: 92 %
Patient temperature: 98.3
Patient temperature: 98.4
Patient temperature: 98.1
Potassium: 4.3 mmol/L (ref 3.5–5.1)
Potassium: 3.7 mmol/L (ref 3.5–5.1)
Potassium: 4.3 mmol/L (ref 3.5–5.1)
Sodium: 148 mmol/L — ABNORMAL HIGH (ref 135–145)
Sodium: 145 mmol/L (ref 135–145)
Sodium: 147 mmol/L — ABNORMAL HIGH (ref 135–145)
TCO2: 27 mmol/L (ref 22–32)
TCO2: 26 mmol/L (ref 22–32)
TCO2: 25 mmol/L (ref 22–32)
pCO2 arterial: 39.4 mmHg (ref 32.0–48.0)
pCO2 arterial: 40.7 mmHg (ref 32.0–48.0)
pCO2 arterial: 36.2 mmHg (ref 32.0–48.0)
pH, Arterial: 7.416 (ref 7.350–7.450)
pH, Arterial: 7.4 (ref 7.350–7.450)
pH, Arterial: 7.435 (ref 7.350–7.450)
pO2, Arterial: 67 mmHg — ABNORMAL LOW (ref 83.0–108.0)
pO2, Arterial: 74 mmHg — ABNORMAL LOW (ref 83.0–108.0)
pO2, Arterial: 61 mmHg — ABNORMAL LOW (ref 83.0–108.0)

## 2019-08-20 LAB — COOXEMETRY PANEL
Carboxyhemoglobin: 3.1 % — ABNORMAL HIGH (ref 0.5–1.5)
Methemoglobin: 0.6 % (ref 0.0–1.5)
O2 Saturation: 83.4 %
Total hemoglobin: 7.8 g/dL — ABNORMAL LOW (ref 12.0–16.0)

## 2019-08-20 LAB — CBC
HCT: 31.9 % — ABNORMAL LOW (ref 39.0–52.0)
Hemoglobin: 9.5 g/dL — ABNORMAL LOW (ref 13.0–17.0)
MCH: 29.4 pg (ref 26.0–34.0)
MCHC: 29.8 g/dL — ABNORMAL LOW (ref 30.0–36.0)
MCV: 98.8 fL (ref 80.0–100.0)
Platelets: 86 10*3/uL — ABNORMAL LOW (ref 150–400)
RBC: 3.23 MIL/uL — ABNORMAL LOW (ref 4.22–5.81)
RDW: 19.1 % — ABNORMAL HIGH (ref 11.5–15.5)
WBC: 5.7 10*3/uL (ref 4.0–10.5)
nRBC: 1.1 % — ABNORMAL HIGH (ref 0.0–0.2)

## 2019-08-20 LAB — LACTATE DEHYDROGENASE: LDH: 346 U/L — ABNORMAL HIGH (ref 98–192)

## 2019-08-20 LAB — PROTIME-INR
INR: 1.2 (ref 0.8–1.2)
Prothrombin Time: 15.1 seconds (ref 11.4–15.2)

## 2019-08-20 LAB — APTT: aPTT: 67 seconds — ABNORMAL HIGH (ref 24–36)

## 2019-08-20 LAB — FIBRINOGEN: Fibrinogen: 448 mg/dL (ref 210–475)

## 2019-08-20 LAB — LACTIC ACID, PLASMA: Lactic Acid, Venous: 1.2 mmol/L (ref 0.5–1.9)

## 2019-08-20 LAB — TRIGLYCERIDES: Triglycerides: 172 mg/dL — ABNORMAL HIGH (ref <150)

## 2019-08-20 MED ORDER — POTASSIUM CHLORIDE 20 MEQ/15ML (10%) PO SOLN
40.0000 meq | Freq: Once | ORAL | Status: AC
  Administered 2019-08-20: 40 meq
  Filled 2019-08-20: qty 30

## 2019-08-20 MED ORDER — PROPOFOL 1000 MG/100ML IV EMUL
0.0000 ug/kg/min | INTRAVENOUS | Status: DC
  Administered 2019-08-21 (×2): 50 ug/kg/min via INTRAVENOUS
  Administered 2019-08-21 (×2): 45 ug/kg/min via INTRAVENOUS
  Filled 2019-08-20 (×3): qty 100

## 2019-08-20 MED ORDER — PIVOT 1.5 CAL PO LIQD
1000.0000 mL | ORAL | Status: DC
  Administered 2019-08-20 – 2019-09-09 (×28): 1000 mL
  Filled 2019-08-20 (×41): qty 1000

## 2019-08-20 MED ORDER — SYMBICORT 160-4.5 MCG/ACT IN AERO: 2.0000 | INHALATION_SPRAY | Freq: Every day | RESPIRATORY_TRACT | 5 refills | Status: DC | PRN

## 2019-08-20 MED ORDER — ROCURONIUM BROMIDE 50 MG/5ML IV SOLN
80.0000 mg | Freq: Once | INTRAVENOUS | Status: AC
  Administered 2019-08-20: 10:00:00 80 mg via INTRAVENOUS

## 2019-08-20 MED ORDER — CHLORHEXIDINE GLUCONATE 0.12 % MT SOLN
OROMUCOSAL | Status: AC
  Administered 2019-08-20: 21:00:00 15 mL via OROMUCOSAL
  Filled 2019-08-20: qty 15

## 2019-08-20 MED ORDER — POTASSIUM CHLORIDE 20 MEQ/15ML (10%) PO SOLN
ORAL | Status: AC
  Filled 2019-08-20: qty 30

## 2019-08-20 MED ORDER — PROPOFOL 1000 MG/100ML IV EMUL
INTRAVENOUS | Status: AC
  Administered 2019-08-20: 23:00:00 30 ug/kg/min via INTRAVENOUS
  Filled 2019-08-20: qty 100

## 2019-08-20 NOTE — Progress Notes (Signed)
Nutrition Follow up  DOCUMENTATION CODES:   Obesity unspecified  INTERVENTION:   Increase tube feeding:  -Pivot 1.5 @ 75 ml/hr via Cortrak (1800 ml)  -60 ml Prostat BID -Free water flushes 300 ml Q8 hours-per CCM  At goal rate TF provides: 3100 kcals, 229 grams protein, 1350 ml free water (2250 ml with flushes). Meets 100% of needs.   NUTRITION DIAGNOSIS:   Increased nutrient needs related to acute illness(COVID PNA) as evidenced by estimated needs.  Ongoing  GOAL:   Patient will meet greater than or equal to 90% of their needs   Addressed via TF  MONITOR:   Diet advancement, Skin, TF tolerance, Weight trends, Labs, I & O's  REASON FOR ASSESSMENT:   Consult Enteral/tube feeding initiation and management  ASSESSMENT:   Patient with PMH significant for GERD, HTN, and asthma. Presents this admission with COVID-19 PNA.   12/14- admit 12/23- chest xray revealed pneumomediastinum  12/24- developed bilateral pneumothoraces  12/26- worsening hypoxemia, intubated  12/27- VV fem/fem ECMO cannulation  12/29- trach  1/5- nasal bleeding- packing per CCM 1/7- nasal packing per ENT 1/8- trach exchanged, ECMO circuit changed   Remains on VV ECMO (day 17). Off paralytic. Off propofol. Still requiring high dose versed/precedex. Had therapeutic bronch this am. Hypernatremia resolved. Tolerating tube feeding at goal rate. Plan slight increase to account for discontinuation of propofol.   Admission weight: 136.1 kg  Current weight: 129.9 kg (down 5 kg over the last week)   Patient requiring ventilator support via trach MV: 1.7 L/min Temp (24hrs), Avg:98.4 F (36.9 C), Min:98.2 F (36.8 C), Max:98.6 F (37 C)   I/O: +18,255 ml since 12/29 UOP: 6,435 ml x 24 hrs  Chest tubes: 200 ml x 24 hrs   Drips: precedex, versed  Medications: 500 mg Vit C daily, SS novolog, 220 mg zinc sulfate daily  Labs: CBG 107-205  Diet Order:   Diet Order            Diet NPO time specified   Diet effective midnight              EDUCATION NEEDS:   Not appropriate for education at this time  Skin:  Skin Assessment: Reviewed RN Assessment  Last BM:  1/12  Height:   Ht Readings from Last 1 Encounters:  08/09/19 5' 8"  (1.727 m)    Weight:   Wt Readings from Last 1 Encounters:  08/20/19 129.9 kg    Ideal Body Weight:  70 kg  BMI:  Body mass index is 43.54 kg/m.  Estimated Nutritional Needs:   Kcal:  3710-6269 kcal (25-30 kcal/kg)  Protein:  180-215 grams  Fluid:  >/= 2.5 L/day   [name] RD, LDN Clinical Nutrition Pager # - [phone]

## 2019-08-20 NOTE — Progress Notes (Signed)
CT Surgery pm rounds  Stable day on VV ECMO Min air leak from R chest tube Lung compliance slowly improving Sedation slowly weaning to assess neuro status Pm Hb 9.5

## 2019-08-20 NOTE — Telephone Encounter (Signed)
ERROR

## 2019-08-20 NOTE — Progress Notes (Signed)
NAME:  [name], MRN:  [id number], DOB:  30-Oct-1975, LOS: 18 ADMISSION DATE:  07/22/2019, CONSULTATION DATE:  12/24 REFERRING MD:  Sloan Leiter, CHIEF COMPLAINT:  Dyspnea   Brief History   44 y/o M admitted 12/14 with COVID pneumonia causing acute hypoxemic respiratory failure. Developed pneumomediastinum 12/23 with concern for possible bilateral small pneumothoraces on 12/24.  PCCM consulted for evaluation of pneumothoraces  Past Medical History  GERD HTN Asthma  Significant Hospital Events   12/14 admit with hypoxemic respiratory failure in setting of COVID-19 pneumonia 12/23 noted to have pneumomediastinum on chest x-ray 12/24 PCCM consulted for evaluation 12/26 worsening hypoxemia, intubated  12/27 VV fem/fem ECMO cannulation 12/28 ETT switch 12/29 Tracheostomy 12/31 Oozing from multiple sites, H/H drop. Heparin held briefly and lidocaine with epi injected around trach site, Cryo, DDVAP given 1/3 CVL being discontinued  1/5: repacked nares 1/7: repacked nares by ENT 1/8: trach exchange distal XLT 1/11: bronch  Consults:  PCCM  Procedures:  1/4 bronch: bal performed  Significant Diagnostic Tests:  CT chest 12/22 >> extensive pneumomediastinum, multifocal patchy bilateral groundglass opacities  Micro Data:  BCx2 12/14 >> negative  Sputum 12/15 >> negative  1/4 bronch:  1/2 acid fast smear bronch: negative 1/2 fungal cx bronch: pending  Antimicrobials:  Received remdesivir, steroids, actemra and convalescent plasma  5 days cefepime 12/24-12/29 Cefepime 1/4-> vanc 1/4->   Interim history/subjective:  Off paralytic, off propofol   Objective   Blood pressure (!) 145/67, pulse 65, temperature 98.3 F (36.8 C), temperature source Oral, resp. rate (!) 36, height _0  (1.727 m), weight 129.9 kg, SpO2 96 %. CVP:  [8 mmHg-12 mmHg] 9 mmHg  Vent Mode: PCV FiO2 (%):  [50 %-100 %] 50 % Set Rate:  [20 bmp] 20 bmp PEEP:  [12 cmH20] 12 cmH20 Plateau Pressure:  [20  cmH20-26 cmH20] 20 cmH20   Intake/Output Summary (Last 24 hours) at 08/20/2019 0745 Last data filed at 08/20/2019 0700 Gross per 24 hour  Intake 7488.65 ml  Output 6485 ml  Net 1003.65 ml   Filed Weights   08/18/19 0350 08/19/19 0457 08/20/19 0500  Weight: 128.7 kg 130.6 kg 129.9 kg   Examination GEN: obese ill man on vent HEENT: trach in place, small bloody sedations, nasal packing in place, small amount of oozing CV: RRR, ext warm PULM: severely diminished but present bilaterally GI: Soft, + BS EXT: Global anascarca NEURO: sedated, no withdrawing PSYCH: RASS -4 SKIN: pale Femoral cannulas in place, sites look okay Bilateral chest tubes in place, no tidaling or air leak Foley in place dark yellow urine  CXR with better upper lobe aeration BMP with improved Sodium, stable Cr LDH stable Lactate 1.2 Plts improved WBC/Hgb stable Heparin 0.28 Fibrinogen 448 Sugars at goal  Resolved Hospital Problem list    Assessment & Plan:    Critically ill due to acute hypoxemic respiratory failure with ARDS secondary to COVID-19, s/p tracheostomy requiring mechanical ventilation and VV ECMO support. Continue ECMO at current flow rate.  Bronchoscopy for secretion clearance as needed: repeat therapeutic bronch today Continue to follow Vt on PC. Keep Driving pressure 15 of less. Hold diuretic today Continues to desaturate with minimal stimulation.  Continue current level enteral sedation. Sedation wean today for neuro exam Premedicate with Dilaudid prior to turns. Heparin with 0.3-0.5 goal   Staph epi and serratia marcescens pneumonia:  cefepime and vancomycin for 10 days  Bilateral pneumothoraces and pneumomediastinum No leak today Continue suction  -20cmH2O  Posterior epistaxis.  repacked 1/7 after discussion with ENT  Continue antibiotics (on for above pna) while packing in place for TSS prevention (ceftriaxone otherwise) Will unpack today and ask ENT to re-evaluate if  resumes   Daily Goals Checklist  Pain/Anxiety/Delirium protocol (if indicated): morphine and valium seroquel enterally. Dilaudid, propofol, precedex, versed infusions. VAP protocol (if indicated): Bundle in place. Respiratory support goals: Continue PCV with ultra-lung protective ventilation  Blood pressure target: On NE to keep MAP>65  DVT prophylaxis: on systemic anticoagulation on VV-ECMO -  Nutrition Status: Nutrition Problem: Increased nutrient needs Etiology: acute illness(COVID PNA) Signs/Symptoms: estimated needs Interventions: Tube feeding, Prostat GI prophylaxis: Protonix per tube. Fluid status goals: diuresis to achieve - 1 to -1.5. (still net even by I/O) Urinary catheter: Guide hemodynamic management Glucose control: euglycemic on SSI. Mobility/therapy needs: Bedrest.  Antibiotic de-escalation: complete 7 days of antibiotics for pneumonia. Home medication reconciliation: reviewed. Daily labs: as per ECMO protocol. Code Status: Full code  Family Communication: will try to reach out today Disposition: ICU   Labs   CBC: Recent Labs  Lab 08/16/19 1317 08/16/19 1404 08/17/19 0400 08/17/19 0407 08/18/19 0414 08/18/19 1519 08/19/19 0439 08/19/19 1600 08/19/19 1603 08/19/19 2231 08/20/19 0430 08/20/19 0444  WBC 7.3   < > 8.3   < > 7.2 6.3 7.0 6.4  --   --   --  5.6  NEUTROABS 3.9  --  3.6  --  2.9  --  2.8  --   --   --   --  PENDING  HGB 8.8*  --  10.1*  --  9.2* 9.0* 9.0* 9.4* 10.5* 11.6* 9.9* 9.5*  HCT 29.5*  --  32.2*  --  30.6* 30.4* 30.4* 32.6* 31.0* 34.0* 29.0* 31.7*  MCV 101.0*   < > 95.5   < > 97.1 100.3* 100.3* 99.4  --   --   --  98.4  PLT 41*   < > 48*   < > 61* 65* 69* 78*  --   --   --  85*   < > = values in this interval not displayed.    Basic Metabolic Panel: Recent Labs  Lab 08/18/19 1519 08/19/19 0439 08/19/19 1600 08/19/19 1603 08/19/19 2231 08/19/19 2330 08/20/19 0430 08/20/19 0444  NA 146* 149* 148* 149* 152* 146* 148* 146*  K  4.1 3.9 4.5 4.5 3.3* 3.8 4.3 4.3  CL 117* 118* 117*  --   --  117*  --  114*  CO2 22 21* 22  --   --  22  --  23  GLUCOSE 151* 131* 162*  --   --  141*  --  135*  BUN 32* 34* 33*  --   --  31*  --  33*  CREATININE 0.49* 0.56* 0.62  --   --  0.46*  --  0.61  CALCIUM 8.6* 8.7* 8.8*  --   --  8.4*  --  8.7*   GFR: Estimated Creatinine Clearance: 153.4 mL/min (by C-G formula based on SCr of 0.61 mg/dL). Recent Labs  Lab 08/17/19 0400 08/18/19 0414 08/18/19 1519 08/19/19 0439 08/19/19 1600 08/20/19 0444 08/20/19 0457  WBC 8.3 7.2 6.3 7.0 6.4 5.6  --   LATICACIDVEN 1.6 2.0*  --  1.7  --   --  1.2    Liver Function Tests: Recent Labs  Lab 08/16/19 0453 08/17/19 0400 08/18/19 0414 08/19/19 0439 08/20/19 0444  AST 22 21 13* 14* 13*  ALT 24 40 31 26 21  ALKPHOS 47 52 49 48 46  BILITOT 1.0 0.8 0.4 0.7 0.7  PROT 5.2* 5.4* 5.2* 5.4* 5.6*  ALBUMIN 3.0* 3.2* 2.7* 2.7* 2.6*   No results for input(s): LIPASE, AMYLASE in the last 168 hours. No results for input(s): AMMONIA in the last 168 hours.  ABG    Component Value Date/Time   PHART 7.416 08/20/2019 0430   PCO2ART 39.4 08/20/2019 0430   PO2ART 67.0 (L) 08/20/2019 0430   HCO3 25.3 08/20/2019 0430   TCO2 27 08/20/2019 0430   ACIDBASEDEF 6.0 (H) 08/19/2019 2231   O2SAT 83.4 08/20/2019 0452     Coagulation Profile: Recent Labs  Lab 08/16/19 0453 08/17/19 0400 08/18/19 0414 08/19/19 0439 08/20/19 0444  INR 1.1 1.1 1.1 1.2 1.2    Cardiac Enzymes: No results for input(s): CKTOTAL, CKMB, CKMBINDEX, TROPONINI in the last 168 hours.  HbA1C: Hgb A1c MFr Bld  Date/Time Value Ref Range Status  07/24/2019 04:50 AM 6.7 (H) 4.8 - 5.6 % Final    Comment:    (NOTE) Pre diabetes:          5.7%-6.4% Diabetes:              >6.4% Glycemic control for   <7.0% adults with diabetes   09/30/2017 05:36 AM 5.7 (H) 4.8 - 5.6 % Final    Comment:    (NOTE)         Prediabetes: 5.7 - 6.4         Diabetes: >6.4         Glycemic  control for adults with diabetes: <7.0     CBG: Recent Labs  Lab 08/19/19 1547 08/19/19 1937 08/19/19 2322 08/20/19 0428 08/20/19 0732  GLUCAP 139* 117* 141* 129* 118*     Critical care time:     CRITICAL CARE   The patient is critically ill with multiple organ systems failure and requires high complexity decision making for assessment and support, frequent evaluation and titration of therapies, application of advanced monitoring technologies and extensive interpretation of multiple databases. Critical Care Time devoted to patient care services described in this note independent of APP/resident time (if applicable)  is 42 minutes.   Erskine Emery MD [facility] 08/20/2019 7:45 AM Personal pager: [phone] If unanswered, please page CCM On-call: [phone]    08/20/2019, 7:45 AM

## 2019-08-20 NOTE — Progress Notes (Signed)
eLink Physician-Brief Progress Note Patient Name: [name] DOB: Mar 27, 1976 MRN: [id number]   Date of Service  08/20/2019  HPI/Events of Note  Bedside nurse reporting agitation, significant vent dyssynchrony , HTN.  Evaluated via camera.  On dilaudid gtt, precedex gtt, versed gtt @2mg /hr.   eICU Interventions  Bolus versed 73m IV now x 1 and increase versed gtt to 448m D/c precedex  Resume previous propofol  May need to consider restarting nimbex if no improvement      Intervention Category Major Interventions: Delirium, psychosis, severe agitation - evaluation and management  KaDarlina Sicilian/07/2020, 10:48 PM

## 2019-08-20 NOTE — Progress Notes (Signed)
Patient ID: [name], male   DOB: 10/26/1975, 44 y.o.   MRN: [id number]    Advanced Heart Failure Rounding Note   Subjective:    Circuit changed and trach exchanged on 1/8.   Remains on VV ecmo with good flows. Nimbex and propofol now ow. Still on high-dose versed and propofol.    Yesterday underwent bronch with removal of extensive amounts of clot. Sats improved.   RPMs turned down and flows still good. Diuresed 4L with 20 IV lasix.   ABG 7.41/39/67/93%  CXR: Diffuse bilateral lung opacities c/w ARDS - little change. Personally reviewed   On vent 50% PEEP 12. PlatP 27 Driving pressure 15 No change. TV ~ 50-70 on PC. Personally reviewed    ECMO parameters Flow 5.44 RPM 3725 dP 34 Pressure -122  Objective:   Weight Range:  Vital Signs:   Temp:  [98.2 F (36.8 C)-98.6 F (37 C)] 98.3 F (36.8 C) (01/12 0400) Pulse Rate:  [62-73] 65 (01/12 0600) Resp:  [0-69] 36 (01/12 0600) BP: (135-145)/(53-67) 145/67 (01/11 1515) SpO2:  [87 %-96 %] 96 % (01/12 0600) Arterial Line BP: (116-161)/(43-76) 125/55 (01/12 0600) FiO2 (%):  [50 %-100 %] 50 % (01/12 0412) Weight:  [129.9 kg] 129.9 kg (01/12 0500) Last BM Date: 08/19/19  Weight change: Filed Weights   08/18/19 0350 08/19/19 0457 08/20/19 0500  Weight: 128.7 kg 130.6 kg 129.9 kg    Intake/Output:   Intake/Output Summary (Last 24 hours) at 08/20/2019 0732 Last data filed at 08/20/2019 0700 Gross per 24 hour  Intake 7488.65 ml  Output 6485 ml  Net 1003.65 ml     Physical Exam: General:  Sedated on vent HEENT: normal + nasal packing Neck: supple. + trach  Hard to assess JVD. Carotids 2+ bilat; no bruits. No lymphadenopathy or thryomegaly appreciated. Cor: PMI nondisplaced. Regular rate & rhythm. No rubs, gallops or murmurs. Lungs: minimal air movement Abdomen: obese soft, nontender, nondistended. No hepatosplenomegaly. No bruits or masses. Good bowel sounds. Extremities: no cyanosis, clubbing, rash, 1+  edema cannulas ok  Neuro:sedated on vent     Telemetry: Sinus 60-70s Personally reviewed  Labs: Basic Metabolic Panel: Recent Labs  Lab 08/18/19 1519 08/19/19 0439 08/19/19 1600 08/19/19 1603 08/19/19 2231 08/19/19 2330 08/20/19 0430 08/20/19 0444  NA 146* 149* 148* 149* 152* 146* 148* 146*  K 4.1 3.9 4.5 4.5 3.3* 3.8 4.3 4.3  CL 117* 118* 117*  --   --  117*  --  114*  CO2 22 21* 22  --   --  22  --  23  GLUCOSE 151* 131* 162*  --   --  141*  --  135*  BUN 32* 34* 33*  --   --  31*  --  33*  CREATININE 0.49* 0.56* 0.62  --   --  0.46*  --  0.61  CALCIUM 8.6* 8.7* 8.8*  --   --  8.4*  --  8.7*    Liver Function Tests: Recent Labs  Lab 08/16/19 0453 08/17/19 0400 08/18/19 0414 08/19/19 0439 08/20/19 0444  AST 22 21 13* 14* 13*  ALT 24 40 _0 ALKPHOS 47 52 49 48 46  BILITOT 1.0 0.8 0.4 0.7 0.7  PROT 5.2* 5.4* 5.2* 5.4* 5.6*  ALBUMIN 3.0* 3.2* 2.7* 2.7* 2.6*   No results for input(s): LIPASE, AMYLASE in the last 168 hours. No results for input(s): AMMONIA in the last 168 hours.  CBC: Recent Labs  Lab 08/16/19 1317 08/16/19  1404 08/17/19 0400 08/17/19 0407 08/18/19 0414 08/18/19 1519 08/19/19 0439 08/19/19 1600 08/19/19 1603 08/19/19 2231 08/20/19 0430 08/20/19 0444  WBC 7.3   < > 8.3   < > 7.2 6.3 7.0 6.4  --   --   --  5.6  NEUTROABS 3.9  --  3.6  --  2.9  --  2.8  --   --   --   --  PENDING  HGB 8.8*  --  10.1*  --  9.2* 9.0* 9.0* 9.4* 10.5* 11.6* 9.9* 9.5*  HCT 29.5*  --  32.2*  --  30.6* 30.4* 30.4* 32.6* 31.0* 34.0* 29.0* 31.7*  MCV 101.0*   < > 95.5   < > 97.1 100.3* 100.3* 99.4  --   --   --  98.4  PLT 41*   < > 48*   < > 61* 65* 69* 78*  --   --   --  85*   < > = values in this interval not displayed.    Cardiac Enzymes: No results for input(s): CKTOTAL, CKMB, CKMBINDEX, TROPONINI in the last 168 hours.  BNP: BNP (last 3 results) Recent Labs    07/22/19 1039  BNP 15.3    ProBNP (last 3 results) No results for input(s):  PROBNP in the last 8760 hours.    Other results:  Imaging: DG CHEST PORT 1 VIEW  Result Date: 08/19/2019 CLINICAL DATA:  Pneumothorax and chest tube EXAM: PORTABLE CHEST 1 VIEW COMPARISON:  Two days ago FINDINGS: Bilateral chest tube in place with no visible pneumothorax. Right PICC with tip at the SVC. Tracheostomy tube in place. Feeding tube which at least reaches the stomach. Large bore cannula from below with tip near the diaphragm. Lungs have become airless again. Obscured heart. IMPRESSION: 1. Unremarkable hardware positioning. 2. No visible pneumothorax.  The lungs have become airless. Electronically Signed   By: Monte Fantasia M.D.   On: 08/19/2019 07:18     Medications:     Scheduled Medications: . artificial tears  1 application Both Eyes G9Q  . vitamin C  500 mg Per Tube Daily  . chlorhexidine gluconate (MEDLINE KIT)  15 mL Mouth Rinse BID  . Chlorhexidine Gluconate Cloth  6 each Topical Daily  . diazepam  20 mg Oral Q6H  . feeding supplement (PRO-STAT SUGAR FREE 64)  60 mL Per Tube BID  . free water  300 mL Per Tube Q8H  . gabapentin  300 mg Per Tube Q8H  . HYDROmorphone  8 mg Per Tube Q4H  . insulin aspart  3-9 Units Subcutaneous Q4H  . mouth rinse  15 mL Mouth Rinse 10 times per day  . oxymetazoline  1 spray Each Nare BID  . pantoprazole sodium  40 mg Per Tube QHS  . QUEtiapine  100 mg Per Tube BID  . sodium chloride flush  10-40 mL Intracatheter Q12H  . sodium chloride flush  10-40 mL Intracatheter Q12H  . Thrombi-Pad  1 each Topical Once  . zinc sulfate  220 mg Per Tube Daily    Infusions: . sodium chloride    . sodium chloride 10 mL/hr at 08/20/19 0700  . albumin human Stopped (08/19/19 2151)  . ceFEPime (MAXIPIME) IV Stopped (08/20/19 0122)  . cisatracurium (NIMBEX) infusion Stopped (08/19/19 1500)  . dexmedetomidine (PRECEDEX) IV infusion 1.2 mcg/kg/hr (08/20/19 0700)  . dextrose    . feeding supplement (PIVOT 1.5 CAL) 1,000 mL (08/20/19 0645)  .  heparin 1,800 Units/hr (08/20/19 0700)  .  HYDROmorphone 12 mg/hr (08/20/19 0700)  . midazolam (VERSED) infusion 40 mg/hr (08/20/19 0721)  . norepinephrine (LEVOPHED) Adult infusion Stopped (08/19/19 1341)  . propofol (DIPRIVAN) infusion Stopped (08/19/19 1655)  . vancomycin Stopped (08/20/19 0049)    PRN Medications: Place/Maintain arterial line **AND** sodium chloride, sodium chloride, acetaminophen (TYLENOL) oral liquid 160 mg/5 mL, acetaminophen, albumin human, dextrose, hydrALAZINE, HYDROmorphone, labetalol, magnesium hydroxide, metoprolol tartrate, midazolam, ondansetron (ZOFRAN) IV, senna-docusate, sodium chloride, sodium chloride flush, sodium chloride flush, sodium chloride flush   Assessment/Plan:    1. Acute hypoxic/hypercarbic respiratory failure due to COVID PNA: Remained markedly hypoxic despite full support. Intubated 12/26. S/p bilateral CTs 12/26 for pneumothoraces.  TEE on 12/26 LVEF 70% RV ok. VV ECMO begun 12/26. Day #9 VV ECMO. Tracheostomy 12/29.  COVID ARDS at this point.  Flows stable and sats improved with deeper sedation and re-paralysis. Ecmo circuit changed 1/8 - ECMO parameters stable Sats improved with bronch yesterday and removal of clots.CCM will repeat today. Also have ENT change nasaa packing.  - Remains sedated. Now off nimbex and propofol. Watch for chugging - Cannula position reviewed on CXR. stable - Volume status improved  Will give lasix 20 IV qod - Sats improved in mid 90s after bronch TVs 60-70s Driving pressure 16-10 range. Continue to support with PC. Suspect TVs will slowly trend up as lung complaince improves. As above repeat bonch. Lasix qod - Continue lung rest.  - PLTs 61k -> 69k -> 85k. LDH 325-> 346 - Lactic acid down. Co-ox ok.    2. Bilateral PTX: Due to barotrauma.  - Driving pressure minimized at 12-13. Stable this am No change  3. COVID PNA: management of COVID infection per CCM. CXR with bilateral multifocal PNA progressing to  likely ARDS.  - s/p 10 days of remdesivir - 12/16, 12/2 s/p tociluzimab - 12/17 s/p convalescent plasma - On decadron at 32m/daily until 1/11 - On cefepime and vanc - see below. Discussed dosing with PharmD personally.Continue course  4. ID: Empiric coverage with vancomycin/cefepime initially for possible secondary bacterial PNA, course completed.   - now back on cefepime and vanc for + cx with staph epidimidis, MSSA and serratia (sensitivities ok).  - Remains stable - D/w PharmD  5. Thrombocytopenia: Slow decrease in platelets, likely low level hemolysis + critical illness.  - switched to bival on 1/4. HIT negative - 85k ths am.  - Back on heparin. Heparin level ok at 0.47.  Dosing discussed with Pharm  6. Anemia: Bleeding at trach site resolved but still bleeding at right chest tube site and CVL. - hgb 10.2 -> 9.2 -> 9.0 -> 9.5 - Transfuse hgb < 8   7. FEN - continue TFs  D/w with ECMO team and CCM at bedside on multidisciplinary rounds. Continue to await ARDS recovery. Details as above.   Length of Stay: 29   CRITICAL CARE Performed by: DGlori Bickers Total critical care time: 35 minutes  Critical care time was exclusive of separately billable procedures and treating other patients.  Critical care was necessary to treat or prevent imminent or life-threatening deterioration.  Critical care was time spent personally by me on the following activities: development of treatment plan with patient and/or surrogate as well as nursing, discussions with consultants, evaluation of patient's response to treatment, examination of patient, obtaining history from patient or surrogate, ordering and performing treatments and interventions, ordering and review of laboratory studies, ordering and review of radiographic studies, pulse oximetry and re-evaluation of patient's condition.  Glori Bickers MD 08/20/2019, 7:32 AM  Advanced Heart Failure Team Pager [phone] (M-F; 7a - 4p)   Please contact [facility] for night-coverage after hours (4p -7a ) and weekends on [email or web address]

## 2019-08-20 NOTE — Progress Notes (Signed)
ANTICOAGULATION CONSULT NOTE  Pharmacy Consult for heparin Indication: ECMO  No Known Allergies  Patient Measurements: Height: 5' 8"  (172.7 cm) Weight: 286 lb 6 oz (129.9 kg) IBW/kg (Calculated) : 68.4 Heparin Dosing Weight: 100.7 kg  Vital Signs: Temp: 98.3 F (36.8 C) (01/12 0400) Temp Source: Oral (01/12 0400) BP: 130/53 (01/12 1104) Pulse Rate: 62 (01/12 1200)  Labs: Recent Labs    08/18/19 0414 08/18/19 0417 08/19/19 0439 08/19/19 1600 08/19/19 1929 08/19/19 2330 08/20/19 0430 08/20/19 0444 08/20/19 0445 08/20/19 0626 08/20/19 0834 08/20/19 1200  HGB 9.2*  --  9.0* 9.4*  --   --  9.9* 9.5*  --   --  11.6*  --   HCT 30.6*  --  30.4* 32.6*  --   --  29.0* 31.7*  --   --  34.0*  --   PLT 61*   < > 69* 78*  --   --   --  85*  --   --   --   --   APTT  --   --   --   --  90*  --   --   --   --  67*  --   --   LABPROT 14.4  --  15.1  --   --   --   --  15.1  --   --   --   --   INR 1.1  --  1.2  --   --   --   --  1.2  --   --   --   --   HEPARINUNFRC 0.40   < > 0.47  --  0.38  --   --   --  0.28*  --   --  0.27*  CREATININE 0.51*   < > 0.56* 0.62  --  0.46*  --  0.61  --   --   --   --    < > = values in this interval not displayed.    Estimated Creatinine Clearance: 153.4 mL/min (by C-G formula based on SCr of 0.61 mg/dL).  Assessment: 44 yo male known COVID+ and ARDS VV ECMO. Heparin was restarted 1/5 (bivalirudin stopped as HIT test was negative).  Heparin level low this morning, remains subtherapeutic after rate adjustment. CBC stable.   Goal of Therapy:  Heparin level: 0.3-0.5 APTT goal: 66-85 sec Monitor platelets by anticoagulation protocol: Yes   Plan:  -Increase IV heparin to 1950 units/hr. -Recheck heparin level in 6hr -Monitor s/sx of bleeding   Arrie Senate, PharmD, BCPS Clinical Pharmacist [phone] Please check AMION for all [facility] numbers 08/20/2019

## 2019-08-20 NOTE — Procedures (Signed)
Bronchoscopy  Indication: Abnormal CXR  Consent: Signed in chart  Anesthesia: In place for ECMO  Procedure - Timeout performed - Bronchoscope advanced through ETT - Findings as below - BAL sent from LUL  Findings - Less clots in airways - Still mucupurulent secretions in LUL  Specimen(s): BAL of LUL  Complications: none immediate

## 2019-08-20 NOTE — Progress Notes (Signed)
CPT not done at this time. Patient currently desat and Rn working to get patient back sedated.

## 2019-08-20 NOTE — Progress Notes (Addendum)
ANTICOAGULATION CONSULT NOTE  Pharmacy Consult for heparin Indication: ECMO  No Known Allergies  Patient Measurements: Height: 5' 8"  (172.7 cm) Weight: 286 lb 6 oz (129.9 kg) IBW/kg (Calculated) : 68.4 Heparin Dosing Weight: 100.7 kg  Vital Signs: Temp: 98.4 F (36.9 C) (01/12 1600) Temp Source: Oral (01/12 1600) BP: 139/62 (01/12 1505) Pulse Rate: 62 (01/12 1800)  Labs: Recent Labs    08/18/19 0414 08/18/19 0417 08/19/19 0439 08/19/19 1600 08/19/19 1929 08/19/19 1929 08/19/19 2330 08/20/19 0444 08/20/19 0445 08/20/19 0626 08/20/19 0834 08/20/19 1200 08/20/19 1655  HGB 9.2*  --  9.0* 9.4*  --    < >  --  9.5*  --   --  11.6*  --  9.5*  HCT 30.6*  --  30.4* 32.6*  --   --   --  31.7*  --   --  34.0*  --  31.9*  PLT 61*   < > 69* 78*  --   --   --  85*  --   --   --   --  86*  APTT  --   --   --   --  90*  --   --   --   --  67*  --   --   --   LABPROT 14.4  --  15.1  --   --   --   --  15.1  --   --   --   --   --   INR 1.1  --  1.2  --   --   --   --  1.2  --   --   --   --   --   HEPARINUNFRC 0.40   < > 0.47  --  0.38  --   --   --  0.28*  --   --  0.27* 0.47  CREATININE 0.51*   < > 0.56* 0.62  --   --  0.46* 0.61  --   --   --   --  0.41*   < > = values in this interval not displayed.    Estimated Creatinine Clearance: 153.4 mL/min (A) (by C-G formula based on SCr of 0.41 mg/dL (L)).  Assessment: 44 yo male known COVID+ and ARDS VV ECMO. Heparin was restarted 1/5 (bivalirudin stopped as HIT test was negative).  Heparin level low this morning and heparin drip increased 1950 uts/r HL this afternoon 0.47 at goal.   CBC stable, minimal oozing.   Goal of Therapy:  Heparin level: 0.3-0.5 APTT goal: 66-85 sec Monitor platelets by anticoagulation protocol: Yes   Plan:  -Continue  IV heparin  1950 units/hr. -Recheck heparin level q12hr -Monitor s/sx of bleeding   Bonnita Nasuti Pharm.D. CPP, BCPS Clinical Pharmacist [phone] 08/20/2019 6:41  PM    Addendum  Heparin drip 1950 uts/hr HL rechecked and HL now 0.24 < goal Will increase 2000 uts/hr and follow up AML  Bonnita Nasuti Pharm.D. CPP, BCPS Clinical Pharmacist [phone] 08/20/2019 9:36 PM

## 2019-08-20 NOTE — Progress Notes (Signed)
Daily Progress Note   Patient Name: [name]       Date: 08/20/2019 DOB: [birth year]  Age: 44 y.o. MRN#: [id number] Attending Physician: Wonda Olds, MD Primary Care Physician: Tamsen Roers, MD Admit Date: 07/22/2019  Reason for Consultation/Follow-up: Establishing goals of care and Psychosocial/spiritual support  Subjective: Evaluated patient at bedside. Discussed care and status with bedside nurses. Chart reviewed. Noted patient had repeat therapeutic bronch today with findings of less clots and continued mucupurulent secretions. Noted his nose was unpacked.   Review of Systems  Unable to perform ROS: Acuity of condition    Length of Stay: 29  Current Medications: Scheduled Meds:  . artificial tears  1 application Both Eyes H2R  . vitamin C  500 mg Per Tube Daily  . chlorhexidine gluconate (MEDLINE KIT)  15 mL Mouth Rinse BID  . Chlorhexidine Gluconate Cloth  6 each Topical Daily  . diazepam  20 mg Oral Q6H  . feeding supplement (PRO-STAT SUGAR FREE 64)  60 mL Per Tube BID  . free water  300 mL Per Tube Q8H  . gabapentin  300 mg Per Tube Q8H  . HYDROmorphone  8 mg Per Tube Q4H  . insulin aspart  3-9 Units Subcutaneous Q4H  . mouth rinse  15 mL Mouth Rinse 10 times per [name]  . oxymetazoline  1 spray Each Nare BID  . pantoprazole sodium  40 mg Per Tube QHS  . QUEtiapine  100 mg Per Tube BID  . sodium chloride flush  10-40 mL Intracatheter Q12H  . sodium chloride flush  10-40 mL Intracatheter Q12H  . Thrombi-Pad  1 each Topical Once  . zinc sulfate  220 mg Per Tube Daily    Continuous Infusions: . sodium chloride    . sodium chloride Stopped (08/20/19 1648)  . albumin human Stopped (08/19/19 2151)  . ceFEPime (MAXIPIME) IV Stopped (08/20/19 0925)  .  cisatracurium (NIMBEX) infusion Stopped (08/19/19 1500)  . dexmedetomidine (PRECEDEX) IV infusion 1.1 mcg/kg/hr (08/20/19 1700)  . dextrose    . feeding supplement (PIVOT 1.5 CAL) 75 mL/hr at 08/20/19 1135  . heparin 1,950 Units/hr (08/20/19 1700)  . HYDROmorphone 8 mg/hr (08/20/19 1700)  . midazolam (VERSED) infusion Stopped (08/20/19 1520)  . norepinephrine (LEVOPHED) Adult infusion Stopped (08/19/19 1341)  . propofol (DIPRIVAN) infusion Stopped (08/19/19  1655)  . vancomycin Stopped (08/20/19 1642)    PRN Meds: Place/Maintain arterial line **AND** sodium chloride, sodium chloride, acetaminophen (TYLENOL) oral liquid 160 mg/5 mL, acetaminophen, albumin human, dextrose, hydrALAZINE, HYDROmorphone, labetalol, magnesium hydroxide, metoprolol tartrate, midazolam, ondansetron (ZOFRAN) IV, senna-docusate, sodium chloride, sodium chloride flush, sodium chloride flush, sodium chloride flush  Physical Exam          Vital Signs: BP 139/62   Pulse 61   Temp 98.4 F (36.9 C) (Oral)   Resp 20   Ht 5' 8" (1.727 m)   Wt 129.9 kg   SpO2 97%   BMI 43.54 kg/m  SpO2: SpO2: 97 % O2 Device: O2 Device: Ventilator O2 Flow Rate: O2 Flow Rate (L/min): 45 L/min(45L 15L NRB)  Intake/output summary:   Intake/Output Summary (Last 24 hours) at 08/20/2019 1713 Last data filed at 08/20/2019 1700 Gross per 24 hour  Intake 5988.82 ml  Output 3985 ml  Net 2003.82 ml   LBM: Last BM Date: 08/20/19 Baseline Weight: Weight: 136.1 kg Most recent weight: Weight: 129.9 kg       Palliative Assessment/Data: PPS: 10%   Flowsheet Rows     Most Recent Value  Intake Tab  Referral Department  Critical care  Unit at Time of Referral  ICU  Palliative Care Primary Diagnosis  Cardiac  Date Notified  08/08/19  Palliative Care Type  New Palliative care  Reason for referral  -- [ecmo]  Date of Admission  07/22/19  Date first seen by Palliative Care  08/09/19  # of days Palliative referral response time  1 [name](s)   # of days IP prior to Palliative referral  17  Clinical Assessment  Psychosocial & Spiritual Assessment  Palliative Care Outcomes      Patient Active Problem List   Diagnosis Date Noted  . Need for emotional support   . Chest tube in place   . Personal history of ECMO   . Advanced care planning/counseling discussion   . Acute respiratory distress syndrome (ARDS) due to COVID-19 virus ([place])   . Palliative care by specialist   . Goals of care, counseling/discussion   . Advanced directives, counseling/discussion   . Subcutaneous crepitus   . Pneumonia due to COVID-19 virus 07/23/2019  . Acute respiratory failure with hypoxia ([place]) 07/22/2019  . Persistent asthma with undetermined severity   . Thrombocytopenia ([place])   . Leukopenia   . Fever   . Gram positive bacterial infection   . Septic arthritis of right acromioclavicular joint ([place]) 09/29/2017  . Chronic left-sided low back pain with left-sided sciatica 09/19/2017  . Epigastric pain   . Pancreatitis 04/17/2017  . Essential hypertension 04/17/2017  . Moderate persistent asthma 07/21/2015  . Allergic rhinoconjunctivitis 07/21/2015  . GERD (gastroesophageal reflux disease) 07/21/2015  . ABNORMALITY OF GAIT 11/12/2009  . TARSAL TUNNEL SYNDROME, LEFT 10/08/2009  . PES PLANUS 10/08/2009    Palliative Care Assessment & Plan   Patient Profile: 44 y/o M admitted 12/14 with COVIDpneumonia causing acutehypoxemic respiratory failure. Developed pneumomediastinum 12/23 with concern for possible bilateral small pneumothoraces on 12/24. PCCM consulted for evaluation of pneumothoraces. During admission patient has had placement of bilateral chest tubes, and is now on VV ECMO, and vent by trach. Recovery has been complicated by ongoing bleeding- frank blood in chest tubes, bleeding from around trach site, epistaxis- has received multiple blood transfusions, platelet transfusion. Also received albumin for volume expansion due to low flow  after receiving diuresis- diuresis now on hold.  Palliative medicine consulted  for support and GOC.   Assessment/Recommendations/Plan  Continued current plan of care Attempted to call patient's spouse for update and support- no family at bedside- left message req return call  Goals of Care and Additional Recommendations: Limitations on Scope of Treatment: Full Scope Treatment  Code Status: Full code  Prognosis:  Unable to determine  Discharge Planning: To Be Determined  Care plan was discussed with patient's RN's.   Thank you for allowing the Palliative Medicine Team to assist in the care of this patient.   Time In: 1100 Time Out: 1115 Total Time 15 mins Prolonged Time Billed no      Greater than 50%  of this time was spent counseling and coordinating care related to the above assessment and plan.  Mariana Kaufman, AGNP-C Palliative Medicine   Please contact Palliative Medicine Team phone at [phone] for questions and concerns.

## 2019-08-20 NOTE — Progress Notes (Signed)
ANTICOAGULATION CONSULT NOTE - Follow Up Consult  Pharmacy Consult for heparin Indication: ECMO  Labs: Recent Labs    08/18/19 0414 08/18/19 1519 08/19/19 0439 08/19/19 1600 08/19/19 1603 08/19/19 1929 08/19/19 2231 08/19/19 2330 08/20/19 0430 08/20/19 0444 08/20/19 0445  HGB 9.2* 9.0* 9.0* 9.4* 10.5*  --  11.6*  --  9.9*  --   --   HCT 30.6* 30.4* 30.4* 32.6* 31.0*  --  34.0*  --  29.0*  --   --   PLT 61* 65* 69* 78*  --   --   --   --   --   --   --   APTT  --   --   --   --   --  90*  --   --   --   --   --   LABPROT 14.4  --  15.1  --   --   --   --   --   --  15.1  --   INR 1.1  --  1.2  --   --   --   --   --   --  1.2  --   HEPARINUNFRC 0.40 0.51 0.47  --   --  0.38  --   --   --   --  0.28*  CREATININE 0.51* 0.49* 0.56* 0.62  --   --   --  0.46*  --  0.61  --     Assessment: 44yo male now subtherapeutic on heparin after several levels at goal though had been steadily trending down; no gtt issues per RN though she does note some continued minor epistaxis (no worsening).  Goal of Therapy:  Heparin level 0.3-0.5 units/ml   Plan:  Will increase heparin gtt by 1 unit/kg/hr to 1800 units/hr and check level with next scheduled labs.    Wynona Neat, PharmD, BCPS  08/20/2019,6:18 AM

## 2019-08-21 ENCOUNTER — Inpatient Hospital Stay (HOSPITAL_COMMUNITY): Payer: Managed Care, Other (non HMO)

## 2019-08-21 LAB — CBC WITH DIFFERENTIAL/PLATELET
Abs Immature Granulocytes: 0 10*3/uL (ref 0.00–0.07)
Basophils Absolute: 0 10*3/uL (ref 0.0–0.1)
Basophils Relative: 0 %
Eosinophils Absolute: 0.5 10*3/uL (ref 0.0–0.5)
Eosinophils Relative: 6 %
HCT: 33.7 % — ABNORMAL LOW (ref 39.0–52.0)
Hemoglobin: 10 g/dL — ABNORMAL LOW (ref 13.0–17.0)
Lymphocytes Relative: 21 %
Lymphs Abs: 1.6 10*3/uL (ref 0.7–4.0)
MCH: 29 pg (ref 26.0–34.0)
MCHC: 29.7 g/dL — ABNORMAL LOW (ref 30.0–36.0)
MCV: 97.7 fL (ref 80.0–100.0)
Monocytes Absolute: 0.8 10*3/uL (ref 0.1–1.0)
Monocytes Relative: 10 %
Neutro Abs: 4.9 10*3/uL (ref 1.7–7.7)
Neutrophils Relative %: 63 %
Platelets: 89 10*3/uL — ABNORMAL LOW (ref 150–400)
RBC: 3.45 MIL/uL — ABNORMAL LOW (ref 4.22–5.81)
RDW: 19.1 % — ABNORMAL HIGH (ref 11.5–15.5)
WBC: 7.8 10*3/uL (ref 4.0–10.5)
nRBC: 0.6 % — ABNORMAL HIGH (ref 0.0–0.2)

## 2019-08-21 LAB — POCT I-STAT 7, (LYTES, BLD GAS, ICA,H+H)
Acid-Base Excess: 1 mmol/L (ref 0.0–2.0)
Acid-Base Excess: 1 mmol/L (ref 0.0–2.0)
Acid-base deficit: 4 mmol/L — ABNORMAL HIGH (ref 0.0–2.0)
Acid-base deficit: 3 mmol/L — ABNORMAL HIGH (ref 0.0–2.0)
Acid-base deficit: 1 mmol/L (ref 0.0–2.0)
Bicarbonate: 21 mmol/L (ref 20.0–28.0)
Bicarbonate: 26.1 mmol/L (ref 20.0–28.0)
Bicarbonate: 22.1 mmol/L (ref 20.0–28.0)
Bicarbonate: 25.8 mmol/L (ref 20.0–28.0)
Bicarbonate: 25.9 mmol/L (ref 20.0–28.0)
Bicarbonate: 27.5 mmol/L (ref 20.0–28.0)
Bicarbonate: 24.5 mmol/L (ref 20.0–28.0)
Calcium, Ion: 1.15 mmol/L (ref 1.15–1.40)
Calcium, Ion: 1.26 mmol/L (ref 1.15–1.40)
Calcium, Ion: 1.19 mmol/L (ref 1.15–1.40)
Calcium, Ion: 1.28 mmol/L (ref 1.15–1.40)
Calcium, Ion: 1.28 mmol/L (ref 1.15–1.40)
Calcium, Ion: 1.27 mmol/L (ref 1.15–1.40)
Calcium, Ion: 1.28 mmol/L (ref 1.15–1.40)
HCT: 25 % — ABNORMAL LOW (ref 39.0–52.0)
HCT: 29 % — ABNORMAL LOW (ref 39.0–52.0)
HCT: 26 % — ABNORMAL LOW (ref 39.0–52.0)
HCT: 30 % — ABNORMAL LOW (ref 39.0–52.0)
HCT: 27 % — ABNORMAL LOW (ref 39.0–52.0)
HCT: 27 % — ABNORMAL LOW (ref 39.0–52.0)
HCT: 25 % — ABNORMAL LOW (ref 39.0–52.0)
Hemoglobin: 8.5 g/dL — ABNORMAL LOW (ref 13.0–17.0)
Hemoglobin: 9.9 g/dL — ABNORMAL LOW (ref 13.0–17.0)
Hemoglobin: 8.8 g/dL — ABNORMAL LOW (ref 13.0–17.0)
Hemoglobin: 10.2 g/dL — ABNORMAL LOW (ref 13.0–17.0)
Hemoglobin: 9.2 g/dL — ABNORMAL LOW (ref 13.0–17.0)
Hemoglobin: 9.2 g/dL — ABNORMAL LOW (ref 13.0–17.0)
Hemoglobin: 8.5 g/dL — ABNORMAL LOW (ref 13.0–17.0)
O2 Saturation: 81 %
O2 Saturation: 100 %
O2 Saturation: 85 %
O2 Saturation: 85 %
O2 Saturation: 78 %
O2 Saturation: 84 %
O2 Saturation: 88 %
Patient temperature: 36.9
Patient temperature: 98.8
Patient temperature: 98
Patient temperature: 36.7
Patient temperature: 36.5
Patient temperature: 36.4
Patient temperature: 98.2
Potassium: 3.3 mmol/L — ABNORMAL LOW (ref 3.5–5.1)
Potassium: 3.9 mmol/L (ref 3.5–5.1)
Potassium: 3.6 mmol/L (ref 3.5–5.1)
Potassium: 4.4 mmol/L (ref 3.5–5.1)
Potassium: 4.4 mmol/L (ref 3.5–5.1)
Potassium: 4.3 mmol/L (ref 3.5–5.1)
Potassium: 3.8 mmol/L (ref 3.5–5.1)
Sodium: 148 mmol/L — ABNORMAL HIGH (ref 135–145)
Sodium: 145 mmol/L (ref 135–145)
Sodium: 146 mmol/L — ABNORMAL HIGH (ref 135–145)
Sodium: 142 mmol/L (ref 135–145)
Sodium: 143 mmol/L (ref 135–145)
Sodium: 143 mmol/L (ref 135–145)
Sodium: 145 mmol/L (ref 135–145)
TCO2: 22 mmol/L (ref 22–32)
TCO2: 27 mmol/L (ref 22–32)
TCO2: 23 mmol/L (ref 22–32)
TCO2: 27 mmol/L (ref 22–32)
TCO2: 27 mmol/L (ref 22–32)
TCO2: 29 mmol/L (ref 22–32)
TCO2: 26 mmol/L (ref 22–32)
pCO2 arterial: 37.2 mmHg (ref 32.0–48.0)
pCO2 arterial: 41 mmHg (ref 32.0–48.0)
pCO2 arterial: 39.4 mmHg (ref 32.0–48.0)
pCO2 arterial: 43.5 mmHg (ref 32.0–48.0)
pCO2 arterial: 48.4 mmHg — ABNORMAL HIGH (ref 32.0–48.0)
pCO2 arterial: 48.4 mmHg — ABNORMAL HIGH (ref 32.0–48.0)
pCO2 arterial: 42.4 mmHg (ref 32.0–48.0)
pH, Arterial: 7.359 (ref 7.350–7.450)
pH, Arterial: 7.413 (ref 7.350–7.450)
pH, Arterial: 7.356 (ref 7.350–7.450)
pH, Arterial: 7.38 (ref 7.350–7.450)
pH, Arterial: 7.333 — ABNORMAL LOW (ref 7.350–7.450)
pH, Arterial: 7.36 (ref 7.350–7.450)
pH, Arterial: 7.368 (ref 7.350–7.450)
pO2, Arterial: 46 mmHg — ABNORMAL LOW (ref 83.0–108.0)
pO2, Arterial: 413 mmHg — ABNORMAL HIGH (ref 83.0–108.0)
pO2, Arterial: 51 mmHg — ABNORMAL LOW (ref 83.0–108.0)
pO2, Arterial: 51 mmHg — ABNORMAL LOW (ref 83.0–108.0)
pO2, Arterial: 44 mmHg — ABNORMAL LOW (ref 83.0–108.0)
pO2, Arterial: 50 mmHg — ABNORMAL LOW (ref 83.0–108.0)
pO2, Arterial: 55 mmHg — ABNORMAL LOW (ref 83.0–108.0)

## 2019-08-21 LAB — BASIC METABOLIC PANEL
Anion gap: 8 (ref 5–15)
Anion gap: 8 (ref 5–15)
BUN: 36 mg/dL — ABNORMAL HIGH (ref 6–20)
BUN: 37 mg/dL — ABNORMAL HIGH (ref 6–20)
CO2: 25 mmol/L (ref 22–32)
CO2: 26 mmol/L (ref 22–32)
Calcium: 8.7 mg/dL — ABNORMAL LOW (ref 8.9–10.3)
Calcium: 9 mg/dL (ref 8.9–10.3)
Chloride: 110 mmol/L (ref 98–111)
Chloride: 107 mmol/L (ref 98–111)
Creatinine, Ser: 0.37 mg/dL — ABNORMAL LOW (ref 0.61–1.24)
Creatinine, Ser: 0.43 mg/dL — ABNORMAL LOW (ref 0.61–1.24)
GFR calc Af Amer: >60 mL/min (ref >60)
GFR calc Af Amer: >60 mL/min (ref >60)
GFR calc non Af Amer: >60 mL/min (ref >60)
GFR calc non Af Amer: >60 mL/min (ref >60)
Glucose, Bld: 160 mg/dL — ABNORMAL HIGH (ref 70–99)
Glucose, Bld: 153 mg/dL — ABNORMAL HIGH (ref 70–99)
Potassium: 4.1 mmol/L (ref 3.5–5.1)
Potassium: 4.2 mmol/L (ref 3.5–5.1)
Sodium: 143 mmol/L (ref 135–145)
Sodium: 141 mmol/L (ref 135–145)

## 2019-08-21 LAB — HEPARIN LEVEL (UNFRACTIONATED)
Heparin Unfractionated: 0.32 IU/mL (ref 0.30–0.70)
Heparin Unfractionated: 0.41 IU/mL (ref 0.30–0.70)

## 2019-08-21 LAB — COOXEMETRY PANEL
Carboxyhemoglobin: 3.6 % — ABNORMAL HIGH (ref 0.5–1.5)
Methemoglobin: 1.3 % (ref 0.0–1.5)
O2 Saturation: 79.9 %
Total hemoglobin: 7.7 g/dL — ABNORMAL LOW (ref 12.0–16.0)

## 2019-08-21 LAB — CBC
HCT: 29.2 % — ABNORMAL LOW (ref 39.0–52.0)
Hemoglobin: 8.7 g/dL — ABNORMAL LOW (ref 13.0–17.0)
MCH: 29.3 pg (ref 26.0–34.0)
MCHC: 29.8 g/dL — ABNORMAL LOW (ref 30.0–36.0)
MCV: 98.3 fL (ref 80.0–100.0)
Platelets: 83 10*3/uL — ABNORMAL LOW (ref 150–400)
RBC: 2.97 MIL/uL — ABNORMAL LOW (ref 4.22–5.81)
RDW: 18.6 % — ABNORMAL HIGH (ref 11.5–15.5)
WBC: 5.3 10*3/uL (ref 4.0–10.5)
nRBC: 0.8 % — ABNORMAL HIGH (ref 0.0–0.2)

## 2019-08-21 LAB — BODY FLUID CELL COUNT WITH DIFFERENTIAL
Lymphs, Fluid: 8 %
Monocyte-Macrophage-Serous Fluid: 12 % — ABNORMAL LOW (ref 50–90)
Neutrophil Count, Fluid: 80 % — ABNORMAL HIGH (ref 0–25)
Total Nucleated Cell Count, Fluid: 220 cu mm (ref 0–1000)

## 2019-08-21 LAB — FIBRINOGEN: Fibrinogen: 517 mg/dL — ABNORMAL HIGH (ref 210–475)

## 2019-08-21 LAB — GLUCOSE, CAPILLARY
Glucose-Capillary: 107 mg/dL — ABNORMAL HIGH (ref 70–99)
Glucose-Capillary: 91 mg/dL (ref 70–99)
Glucose-Capillary: 116 mg/dL — ABNORMAL HIGH (ref 70–99)
Glucose-Capillary: 124 mg/dL — ABNORMAL HIGH (ref 70–99)
Glucose-Capillary: 143 mg/dL — ABNORMAL HIGH (ref 70–99)
Glucose-Capillary: 104 mg/dL — ABNORMAL HIGH (ref 70–99)

## 2019-08-21 LAB — HEPATIC FUNCTION PANEL
ALT: 22 U/L (ref 0–44)
AST: 17 U/L (ref 15–41)
Albumin: 2.8 g/dL — ABNORMAL LOW (ref 3.5–5.0)
Alkaline Phosphatase: 50 U/L (ref 38–126)
Bilirubin, Direct: <0.1 mg/dL (ref 0.0–0.2)
Total Bilirubin: 0.8 mg/dL (ref 0.3–1.2)
Total Protein: 5.9 g/dL — ABNORMAL LOW (ref 6.5–8.1)

## 2019-08-21 LAB — CULTURE, BAL-QUANTITATIVE W GRAM STAIN

## 2019-08-21 LAB — APTT
aPTT: 75 seconds — ABNORMAL HIGH (ref 24–36)
aPTT: 108 seconds — ABNORMAL HIGH (ref 24–36)

## 2019-08-21 LAB — LACTIC ACID, PLASMA: Lactic Acid, Venous: 1.2 mmol/L (ref 0.5–1.9)

## 2019-08-21 LAB — PROTIME-INR
INR: 1.1 (ref 0.8–1.2)
Prothrombin Time: 14.4 seconds (ref 11.4–15.2)

## 2019-08-21 LAB — LACTATE DEHYDROGENASE: LDH: 368 U/L — ABNORMAL HIGH (ref 98–192)

## 2019-08-21 LAB — TRIGLYCERIDES: Triglycerides: 282 mg/dL — ABNORMAL HIGH (ref <150)

## 2019-08-21 MED ORDER — ALBUMIN HUMAN 25 % IV SOLN
25.0000 g | Freq: Once | INTRAVENOUS | Status: AC
  Administered 2019-08-21: 25 g via INTRAVENOUS
  Filled 2019-08-21: qty 100

## 2019-08-21 MED ORDER — FUROSEMIDE 10 MG/ML IJ SOLN
20.0000 mg | Freq: Once | INTRAMUSCULAR | Status: AC
  Administered 2019-08-21: 09:00:00 20 mg via INTRAVENOUS
  Filled 2019-08-21: qty 2

## 2019-08-21 MED ORDER — NICARDIPINE HCL IN NACL 20-0.86 MG/200ML-% IV SOLN
3.0000 mg/h | INTRAVENOUS | Status: DC
  Filled 2019-08-21: qty 200

## 2019-08-21 MED ORDER — SODIUM CHLORIDE 0.9 % IV SOLN
1.0000 g | INTRAVENOUS | Status: DC
  Administered 2019-08-22: 1 g via INTRAVENOUS
  Filled 2019-08-21: qty 1
  Filled 2019-08-21: qty 10

## 2019-08-21 MED ORDER — QUETIAPINE FUMARATE 100 MG PO TABS
200.0000 mg | ORAL_TABLET | Freq: Two times a day (BID) | ORAL | Status: DC
  Administered 2019-08-21 – 2019-08-31 (×19): 200 mg
  Filled 2019-08-21 (×19): qty 2

## 2019-08-21 MED ORDER — DEXMEDETOMIDINE HCL IN NACL 400 MCG/100ML IV SOLN
0.4000 ug/kg/h | INTRAVENOUS | Status: DC
  Administered 2019-08-21: 1 ug/kg/h via INTRAVENOUS
  Administered 2019-08-21: 1.2 ug/kg/h via INTRAVENOUS
  Administered 2019-08-21: 08:00:00 0.4 ug/kg/h via INTRAVENOUS
  Administered 2019-08-21 (×3): 1 ug/kg/h via INTRAVENOUS
  Administered 2019-08-22 – 2019-08-23 (×14): 1.2 ug/kg/h via INTRAVENOUS
  Administered 2019-08-23 (×3): 1.6 ug/kg/h via INTRAVENOUS
  Administered 2019-08-23 – 2019-08-24 (×2): 1.2 ug/kg/h via INTRAVENOUS
  Administered 2019-08-24: 1.3 ug/kg/h via INTRAVENOUS
  Administered 2019-08-24: 1.5 ug/kg/h via INTRAVENOUS
  Administered 2019-08-24: 1.6 ug/kg/h via INTRAVENOUS
  Administered 2019-08-24: 1.3 ug/kg/h via INTRAVENOUS
  Administered 2019-08-24: 1.2 ug/kg/h via INTRAVENOUS
  Filled 2019-08-21: qty 100
  Filled 2019-08-21: qty 200
  Filled 2019-08-21 (×2): qty 100
  Filled 2019-08-21 (×2): qty 200
  Filled 2019-08-21: qty 100
  Filled 2019-08-21: qty 200
  Filled 2019-08-21: qty 100
  Filled 2019-08-21 (×2): qty 200
  Filled 2019-08-21 (×3): qty 100
  Filled 2019-08-21 (×2): qty 200
  Filled 2019-08-21 (×6): qty 100
  Filled 2019-08-21: qty 200

## 2019-08-21 MED ORDER — QUETIAPINE FUMARATE 100 MG PO TABS
100.0000 mg | ORAL_TABLET | Freq: Once | ORAL | Status: AC
  Administered 2019-08-21: 12:00:00 100 mg
  Filled 2019-08-21: qty 1

## 2019-08-21 NOTE — Progress Notes (Signed)
ANTICOAGULATION CONSULT NOTE  Pharmacy Consult for heparin Indication: ECMO  No Known Allergies  Patient Measurements: Height: 5' 8"  (172.7 cm) Weight: 286 lb 6 oz (129.9 kg) IBW/kg (Calculated) : 68.4 Heparin Dosing Weight: 100.7 kg  Vital Signs: Temp: 98.8 F (37.1 C) (01/13 0000) Temp Source: Oral (01/12 2000) Pulse Rate: 85 (01/13 0400)  Labs: Recent Labs    08/19/19 0439 08/19/19 0447 08/19/19 1929 08/19/19 2231 08/20/19 0444 08/20/19 0445 08/20/19 0626 08/20/19 0834 08/20/19 1655 08/20/19 2015 08/21/19 0112 08/21/19 0203 08/21/19 0510  HGB 9.0*  --   --   --  9.5*   < >  --   --  9.5*  --  8.5* 10.0* 8.8*  HCT 30.4*  --   --   --  31.7*   < >  --   --  31.9*  --  25.0* 33.7* 26.0*  PLT 69*   < >  --   --  85*  --   --   --  86*  --   --  89*  --   APTT  --   --  90*  --   --   --  67*  --   --   --   --  75*  --   LABPROT 15.1  --   --   --  15.1  --   --   --   --   --   --  14.4  --   INR 1.2  --   --   --  1.2  --   --   --   --   --   --  1.1  --   HEPARINUNFRC 0.47  --  0.38   < >  --   --   --    < > 0.47 0.24*  --  0.32  --   CREATININE 0.56*   < >  --    < > 0.61  --   --   --  0.41*  --   --  0.37*  --    < > = values in this interval not displayed.    Estimated Creatinine Clearance: 153.4 mL/min (A) (by C-G formula based on SCr of 0.37 mg/dL (L)).  Assessment: 44 yo male known COVID+ and ARDS VV ECMO. Heparin was restarted 1/5 (bivalirudin stopped as HIT test was negative).  Heparin level therapeutic this morning at 0.31 but has been intermittently low so will increase infusion. H/H and pltc stable.   Goal of Therapy:  Heparin level: 0.3-0.5 APTT goal: 66-85 sec Monitor platelets by anticoagulation protocol: Yes   Plan:  -Increase IV heparin to 2100 units/hr. -Check heparin level and aPTT q12h -Monitor s/sx of bleeding   Arrie Senate, PharmD, BCPS Clinical Pharmacist [phone] Please check AMION for all [facility]  numbers 08/21/2019

## 2019-08-21 NOTE — Progress Notes (Signed)
Pt O2 sat at 88% around 2300. ECMO Sp was made aware and at bedside to evaluate. Despite interventions from both nurse and ECMO sp, pt continued to desat. Pt then started over breathing the vent becoming very asynchronous. Nurse started to go back up on pt's sedation with very little to no improvement. Pt sats became worse even to the 70s. On call surgeon Prescott Gum) notified and gave verbal order twice to give one time doses of Nimbex 32m) Pt's sats still were in 70s to low 80s despite several interventions. Another verbal order was given to go up on all sedation until I reach max if needed. After all sedation was at max, Nurse notified physician(Orvan Seen again who gave verbal order to restart Nimbex continuous gtt.

## 2019-08-21 NOTE — Progress Notes (Signed)
NAME:  [name], MRN:  [id number], DOB:  03-24-1976, LOS: 69 ADMISSION DATE:  07/22/2019, CONSULTATION DATE:  12/24 REFERRING MD:  Sloan Leiter, CHIEF COMPLAINT:  Dyspnea   Brief History   44 y/o M admitted 12/14 with COVID pneumonia causing acute hypoxemic respiratory failure. Developed pneumomediastinum 12/23 with concern for possible bilateral small pneumothoraces on 12/24.  PCCM consulted for evaluation of pneumothoraces  Past Medical History  GERD HTN Asthma  Significant Hospital Events   12/14 admit with hypoxemic respiratory failure in setting of COVID-19 pneumonia 12/23 noted to have pneumomediastinum on chest x-ray 12/24 PCCM consulted for evaluation 12/26 worsening hypoxemia, intubated  12/27 VV fem/fem ECMO cannulation 12/28 ETT switch 12/29 Tracheostomy 12/31 Oozing from multiple sites, H/H drop. Heparin held briefly and lidocaine with epi injected around trach site, Cryo, DDVAP given 1/3 CVL being discontinued  1/5: repacked nares 1/7: repacked nares by ENT 1/8: trach exchange distal XLT 1/11: bronch  Consults:  PCCM  Procedures:  1/4 bronch: bal performed  Significant Diagnostic Tests:  CT chest 12/22 >> extensive pneumomediastinum, multifocal patchy bilateral groundglass opacities  Micro Data:  BCx2 12/14 >> negative  Sputum 12/15 >> negative  1/9 bronch: serratia and MSSA 1/2 acid fast smear bronch: negative 1/2 fungal cx bronch: pending 1/12 BAL >>  Antimicrobials:  Received remdesivir, steroids, actemra and convalescent plasma  5 days cefepime 12/24-12/29 Cefepime 1/4-> vanc 1/4->   Interim history/subjective:  Woke up agitated overnight.  Numerous changes made to sedation and paralytic resumed due to flow issues and desaturations. Otherwise everything steady.  Objective   Blood pressure 139/62, pulse 91, temperature 98.8 F (37.1 C), resp. rate 20, height _0  (1.727 m), weight 131.3 kg, SpO2 90 %. CVP:  [12 mmHg] 12 mmHg  Vent Mode:  PCV FiO2 (%):  [50 %-100 %] 50 % Set Rate:  [20 bmp] 20 bmp PEEP:  [12 cmH20] 12 cmH20 Plateau Pressure:  [21 cmH20-26 cmH20] 26 cmH20   Intake/Output Summary (Last 24 hours) at 08/21/2019 0745 Last data filed at 08/21/2019 0700 Gross per 24 hour  Intake 5877.55 ml  Output 4055 ml  Net 1822.55 ml   Filed Weights   08/19/19 0457 08/20/19 0500 08/21/19 0500  Weight: 130.6 kg 129.9 kg 131.3 kg   Examination GEN: obese ill man on vent HEENT: trach in place, small bloody sedations, nasal packing in place on R, oozing and clots on L which was unpacked yesterday CV: RRR, ext warm PULM: severely diminished but present bilaterally GI: Soft, + BS EXT: Global anascarca NEURO: sedated, paralyzed, no withdrawing PSYCH: RASS -4 SKIN: pale Femoral cannulas in place, sites look okay Bilateral chest tubes in place, no tidaling or air leak, ongoing serosanguinous output Foley in place dark yellow urine  CXR pending BMP looks good Sugars look good LDH stable in 300s ABG stable Heparin 0.32 CBC stable, plts slightly better  Resolved Hospital Problem list    Assessment & Plan:    Critically ill due to acute hypoxemic respiratory failure with ARDS secondary to COVID-19, s/p tracheostomy requiring mechanical ventilation and VV ECMO support. Continue ECMO at current flow rate.  Bronchoscopy for secretion clearance as needed Continue to follow Vt on PC. Keep Driving pressure 15 of less. Will discuss diuretics with CHF team during rounds Continues to desaturate with minimal stimulation.  Continue current level enteral sedation. Switch back to precedex, versed, try to wean from propofol, wean off paralytic as able Premedicate with Dilaudid prior to turns. Heparin with 0.3-0.5 goal  MSSA and serratia marcescens pneumonia:  cefepime and vancomycin for 10 days Switch to ceftriaxone after the 10 days, this is for TSS prevention while nasal packing in place  Bilateral pneumothoraces and  pneumomediastinum No leak today Continue suction  -20cmH2O  Posterior epistaxis.  repacked 1/7 after discussion with ENT Repacked today with afrin qday   Daily Goals Checklist  Pain/Anxiety/Delirium protocol (if indicated): morphine and valium seroquel enterally. Dilaudid, propofol, precedex, versed infusions. VAP protocol (if indicated): Bundle in place. Respiratory support goals: Continue PCV with ultra-lung protective ventilation  Blood pressure target: On NE to keep MAP>65  DVT prophylaxis: on systemic anticoagulation on VV-ECMO -  Nutrition Status: Nutrition Problem: Increased nutrient needs Etiology: acute illness(COVID PNA) Signs/Symptoms: estimated needs Interventions: Tube feeding, Prostat GI prophylaxis: Protonix per tube. Fluid status goals: diuresis to achieve - 1 to -1.5. (still net even by I/O) Urinary catheter: Guide hemodynamic management Glucose control: euglycemic on SSI. Mobility/therapy needs: Bedrest.  Antibiotic de-escalation: complete 7 days of antibiotics for pneumonia. Home medication reconciliation: reviewed. Daily labs: as per ECMO protocol. Code Status: Full code  Family Communication: will try to reach out today Disposition: ICU   Labs   CBC: Recent Labs  Lab 08/17/19 0400 08/17/19 0407 08/18/19 0414 08/18/19 0417 08/19/19 0439 08/19/19 1600 08/20/19 0444 08/20/19 1655 08/20/19 2020 08/21/19 0032 08/21/19 0112 08/21/19 0203 08/21/19 0510  WBC 8.3   < > 7.2   < > 7.0 6.4 5.6 5.7  --   --   --  7.8  --   NEUTROABS 3.6  --  2.9  --  2.8  --  1.9  --   --   --   --  4.9  --   HGB 10.1*  --  9.2*  --  9.0* 9.4* 9.5* 9.5* 10.2* 9.9* 8.5* 10.0* 8.8*  HCT 32.2*  --  30.6*  --  30.4* 32.6* 31.7* 31.9* 30.0* 29.0* 25.0* 33.7* 26.0*  MCV 95.5   < > 97.1   < > 100.3* 99.4 98.4 98.8  --   --   --  97.7  --   PLT 48*   < > 61*   < > 69* 78* 85* 86*  --   --   --  89*  --    < > = values in this interval not displayed.    Basic Metabolic  Panel: Recent Labs  Lab 08/19/19 1600 08/19/19 2330 08/20/19 0444 08/20/19 1655 08/20/19 2020 08/21/19 0032 08/21/19 0112 08/21/19 0203 08/21/19 0510  NA 148* 146* 146* 146* 147* 145 148* 143 146*  K 4.5 3.8 4.3 3.7 4.3 3.9 3.3* 4.1 3.6  CL 117* 117* 114* 115*  --   --   --  110  --   CO2 _0 --   --   --  25  --   GLUCOSE 162* 141* 135* 132*  --   --   --  160*  --   BUN 33* 31* 33* 36*  --   --   --  36*  --   CREATININE 0.62 0.46* 0.61 0.41*  --   --   --  0.37*  --   CALCIUM 8.8* 8.4* 8.7* 8.7*  --   --   --  8.7*  --    GFR: Estimated Creatinine Clearance: 154.4 mL/min (A) (by C-G formula based on SCr of 0.37 mg/dL (L)). Recent Labs  Lab 08/18/19 0414 08/19/19 0439 08/19/19 1600 08/20/19 0444 08/20/19 0457 08/20/19  1655 08/21/19 0203  WBC 7.2 7.0 6.4 5.6  --  5.7 7.8  LATICACIDVEN 2.0* 1.7  --   --  1.2  --  1.2    Liver Function Tests: Recent Labs  Lab 08/17/19 0400 08/18/19 0414 08/19/19 0439 08/20/19 0444 08/21/19 0203  AST 21 13* 14* 13* 17  ALT 40 _0 ALKPHOS 52 49 48 46 50  BILITOT 0.8 0.4 0.7 0.7 0.8  PROT 5.4* 5.2* 5.4* 5.6* 5.9*  ALBUMIN 3.2* 2.7* 2.7* 2.6* 2.8*   No results for input(s): LIPASE, AMYLASE in the last 168 hours. No results for input(s): AMMONIA in the last 168 hours.  ABG    Component Value Date/Time   PHART 7.356 08/21/2019 0510   PCO2ART 39.4 08/21/2019 0510   PO2ART 51.0 (L) 08/21/2019 0510   HCO3 22.1 08/21/2019 0510   TCO2 23 08/21/2019 0510   ACIDBASEDEF 3.0 (H) 08/21/2019 0510   O2SAT 79.9 08/21/2019 0644     Coagulation Profile: Recent Labs  Lab 08/17/19 0400 08/18/19 0414 08/19/19 0439 08/20/19 0444 08/21/19 0203  INR 1.1 1.1 1.2 1.2 1.1    Cardiac Enzymes: No results for input(s): CKTOTAL, CKMB, CKMBINDEX, TROPONINI in the last 168 hours.  HbA1C: Hgb A1c MFr Bld  Date/Time Value Ref Range Status  07/24/2019 04:50 AM 6.7 (H) 4.8 - 5.6 % Final    Comment:    (NOTE) Pre  diabetes:          5.7%-6.4% Diabetes:              >6.4% Glycemic control for   <7.0% adults with diabetes   09/30/2017 05:36 AM 5.7 (H) 4.8 - 5.6 % Final    Comment:    (NOTE)         Prediabetes: 5.7 - 6.4         Diabetes: >6.4         Glycemic control for adults with diabetes: <7.0     CBG: Recent Labs  Lab 08/20/19 1158 08/20/19 1608 08/20/19 2018 08/21/19 0111 08/21/19 0508  GLUCAP 111* 141* 104* 107* 91     Critical care time:     CRITICAL CARE   The patient is critically ill with multiple organ systems failure and requires high complexity decision making for assessment and support, frequent evaluation and titration of therapies, application of advanced monitoring technologies and extensive interpretation of multiple databases. Critical Care Time devoted to patient care services described in this note independent of APP/resident time (if applicable)  is 35 minutes.   Erskine Emery MD [facility] 08/21/2019 7:45 AM Personal pager: [phone] If unanswered, please page CCM On-call: [phone]    08/21/2019, 7:45 AM

## 2019-08-21 NOTE — Progress Notes (Signed)
ANTICOAGULATION CONSULT NOTE  Pharmacy Consult for heparin Indication: ECMO  No Known Allergies  Patient Measurements: Height: 5' 8"  (172.7 cm) Weight: 289 lb 7.4 oz (131.3 kg) IBW/kg (Calculated) : 68.4 Heparin Dosing Weight: 100.7 kg  Vital Signs: BP: 126/57 (01/13 1501) Pulse Rate: 77 (01/13 1700)  Labs: Recent Labs    08/19/19 0439 08/19/19 0447 08/20/19 0444 08/20/19 0445 08/20/19 0626 08/20/19 0834 08/20/19 1655 08/20/19 1655 08/20/19 2015 08/21/19 0203 08/21/19 1220 08/21/19 1336 08/21/19 1621  HGB 9.0*  --  9.5*   < >  --   --  9.5*   < >  --  10.0* 9.2* 9.2* 8.7*  HCT 30.4*  --  31.7*   < >  --   --  31.9*   < >  --  33.7* 27.0* 27.0* 29.2*  PLT 69*   < > 85*  --   --   --  86*  --   --  89*  --   --  83*  APTT  --    < >  --   --  67*  --   --   --   --  75*  --   --  108*  LABPROT 15.1  --  15.1  --   --   --   --   --   --  14.4  --   --   --   INR 1.2  --  1.2  --   --   --   --   --   --  1.1  --   --   --   HEPARINUNFRC 0.47   < >  --   --   --    < > 0.47  --  0.24* 0.32  --   --  0.41  CREATININE 0.56*   < > 0.61  --   --   --  0.41*  --   --  0.37*  --   --  0.43*   < > = values in this interval not displayed.    Estimated Creatinine Clearance: 154.4 mL/min (A) (by C-G formula based on SCr of 0.43 mg/dL (L)).  Assessment: 44 yo male known COVID+ and ARDS VV ECMO. Heparin was restarted 1/5 (bivalirudin stopped as HIT test was negative).  Heparin level therapeutic this evening at 0.41.  APTT slightly high, but heparin level appears to be the more accurate lab. H/H and pltc stable.   Goal of Therapy:  Heparin level: 0.3-0.5 APTT goal: 66-85 sec Monitor platelets by anticoagulation protocol: Yes   Plan:  -Continue IV heparin at current rate. -Check heparin level and aPTT q12h -Monitor s/sx of bleeding   [name], [facility] Clinical Pharmacist Phone [phone]  08/21/2019 5:41 PM

## 2019-08-21 NOTE — Progress Notes (Signed)
Patient ID: [name], male   DOB: 04/23/1976, 44 y.o.   MRN: [id number]    Advanced Heart Failure Rounding Note   Subjective:    Circuit changed and trach exchanged on 1/8.   Remains on VV ecmo with good flows.   Sedation and paralytics weaned again overnight and became very agitated and hypertensive. Had to re-sedate and reparalyze.   Earlier today underwent repeat bronch and had suctioning of large amount of clots from bronchial tree.   Flows goods. sats back up into low 90s. TVs remain 50-70 range.     ABG 7.41/39/67/93%  CXR: Diffuse bilateral lung opacities c/w ARDS - improving in upper lobesPersonally reviewed   On vent 50% PEEP 12. PlatP 27 Driving pressure 15 No change. TV ~ 50-70 on PC. Personally reviewed    ECMO parameters Flow 5.33 RPM 3730 dP 33 Pressure -120  Objective:   Weight Range:  Vital Signs:   Temp:  [98.1 F (36.7 C)-98.8 F (37.1 C)] 98.8 F (37.1 C) (01/13 0000) Pulse Rate:  [61-100] 86 (01/13 1112) Resp:  [0-20] 20 (01/13 1112) BP: (95-187)/(42-65) 95/42 (01/13 1112) SpO2:  [77 %-98 %] 88 % (01/13 1112) Arterial Line BP: (100-211)/(39-72) 100/44 (01/13 1100) FiO2 (%):  [50 %-100 %] 50 % (01/13 1112) Weight:  [131.3 kg] 131.3 kg (01/13 0500) Last BM Date: 08/21/19  Weight change: Filed Weights   08/19/19 0457 08/20/19 0500 08/21/19 0500  Weight: 130.6 kg 129.9 kg 131.3 kg    Intake/Output:   Intake/Output Summary (Last 24 hours) at 08/21/2019 1305 Last data filed at 08/21/2019 1220 Gross per 24 hour  Intake 6184.66 ml  Output 4855 ml  Net 1329.66 ml     Physical Exam: General:  Sedated on vent through trach HEENT: normal + nasal packing + swollen Neck: supple. Hard to assess JVD. Carotids 2+ bilat; no bruits. No lymphadenopathy or thryomegaly appreciated. Cor: PMI nondisplaced. Regular rate & rhythm. No rubs, gallops or murmurs. Lungs: minimal air movemnet Abdomen: obese soft, nontender, nondistended. No  hepatosplenomegaly. No bruits or masses. Good bowel sounds. Extremities: no cyanosis, clubbing, rash, 1-2+ edema cannula sites ok  Neuro: sedated paralyzed  Telemetry: Sinus 70-80s Personally reviewed  Labs: Basic Metabolic Panel: Recent Labs  Lab 08/19/19 1600 08/19/19 2330 08/20/19 0444 08/20/19 1655 08/21/19 0112 08/21/19 0203 08/21/19 0510 08/21/19 0918 08/21/19 1220  NA 148* 146* 146* 146* 148* 143 146* 142 143  K 4.5 3.8 4.3 3.7 3.3* 4.1 3.6 4.4 4.4  CL 117* 117* 114* 115*  --  110  --   --   --   CO2 22 22 23 25   --  25  --   --   --   GLUCOSE 162* 141* 135* 132*  --  160*  --   --   --   BUN 33* 31* 33* 36*  --  36*  --   --   --   CREATININE 0.62 0.46* 0.61 0.41*  --  0.37*  --   --   --   CALCIUM 8.8* 8.4* 8.7* 8.7*  --  8.7*  --   --   --     Liver Function Tests: Recent Labs  Lab 08/17/19 0400 08/18/19 0414 08/19/19 0439 08/20/19 0444 08/21/19 0203  AST 21 13* 14* 13* 17  ALT 40 31 26 21 22   ALKPHOS 52 49 48 46 50  BILITOT 0.8 0.4 0.7 0.7 0.8  PROT 5.4* 5.2* 5.4* 5.6* 5.9*  ALBUMIN 3.2* 2.7* 2.7*  2.6* 2.8*   No results for input(s): LIPASE, AMYLASE in the last 168 hours. No results for input(s): AMMONIA in the last 168 hours.  CBC: Recent Labs  Lab 08/17/19 0400 08/17/19 0407 08/18/19 0414 08/18/19 0417 08/19/19 0439 08/19/19 1600 08/20/19 0444 08/20/19 1655 08/21/19 0112 08/21/19 0203 08/21/19 0510 08/21/19 0918 08/21/19 1220  WBC 8.3   < > 7.2   < > 7.0 6.4 5.6 5.7  --  7.8  --   --   --   NEUTROABS 3.6  --  2.9  --  2.8  --  1.9  --   --  4.9  --   --   --   HGB 10.1*  --  9.2*  --  9.0* 9.4* 9.5* 9.5* 8.5* 10.0* 8.8* 10.2* 9.2*  HCT 32.2*  --  30.6*  --  30.4* 32.6* 31.7* 31.9* 25.0* 33.7* 26.0* 30.0* 27.0*  MCV 95.5   < > 97.1   < > 100.3* 99.4 98.4 98.8  --  97.7  --   --   --   PLT 48*   < > 61*   < > 69* 78* 85* 86*  --  89*  --   --   --    < > = values in this interval not displayed.    Cardiac Enzymes: No results for  input(s): CKTOTAL, CKMB, CKMBINDEX, TROPONINI in the last 168 hours.  BNP: BNP (last 3 results) Recent Labs    07/22/19 1039  BNP 15.3    ProBNP (last 3 results) No results for input(s): PROBNP in the last 8760 hours.    Other results:  Imaging: DG Chest 1 View  Result Date: 08/21/2019 CLINICAL DATA:  COVID with respiratory distress EXAM: CHEST  1 VIEW COMPARISON:  Yesterday FINDINGS: Tracheostomy tube in place. Feeding tube which at least reaches the stomach. Bilateral chest tube and large bore IVC cannula. Right PICC with tip at the expected upper cavoatrial junction. Nearly airless bilateral lungs, unchanged. No visible pneumothorax. The heart is obscured. IMPRESSION: 1. Stable hardware positioning 2. Unchanged nearly airless lungs. Electronically Signed   By: Monte Fantasia M.D.   On: 08/21/2019 08:18   DG CHEST PORT 1 VIEW  Result Date: 08/21/2019 CLINICAL DATA:  Hypoxia. EXAM: PORTABLE CHEST 1 VIEW COMPARISON:  Same day. FINDINGS: Tracheostomy and feeding tubes are unchanged in position. Bilateral chest tubes are noted without pneumothorax. Right-sided PICC line is unchanged. Stable diffuse opacification is seen involving both lungs most consistent with pneumonia. Some degree of pleural effusion cannot be excluded. Bony thorax is unremarkable. IMPRESSION: Stable support apparatus. Stable bilateral chest tubes without pneumothorax. Stable diffuse opacification is seen involving both lungs most consistent with pneumonia. Some degree of pleural effusion cannot be excluded. Electronically Signed   By: Marijo Conception M.D.   On: 08/21/2019 12:14   DG Chest Port 1 View  Result Date: 08/20/2019 CLINICAL DATA:  44 year old male positive COVID-19.  ARDS with ECMO. EXAM: PORTABLE CHEST 1 VIEW COMPARISON:  0556 hours today and earlier. FINDINGS: Portable AP semi upright view at 2316 hours. Stable tracheostomy tube. Feeding tube courses to the abdomen, tip not included. Stable bilateral chest  tubes. Stable right PICC line. Diffuse bilateral lung opacification with only minimal aeration. Obscured mediastinal contours, diaphragm. No pneumothorax identified. No acute osseous abnormality identified. IMPRESSION: 1.  Stable lines and tubes. 2. Unchanged subtotal lung opacification, minimally increased ventilation since 08/19/2019. Electronically Signed   By: Genevie Ann M.D.   On:  08/20/2019 23:27   DG CHEST PORT 1 VIEW  Result Date: 08/20/2019 CLINICAL DATA:  Hypoxia EXAM: PORTABLE CHEST 1 VIEW COMPARISON:  August 19, 2019 FINDINGS: Tracheostomy catheter tip is 3.1 cm above the carina. Feeding tube tip is below the diaphragm. There is a chest tube on each side, unchanged in position. Central catheter tip is in the superior vena cava. Large bore cannula seen to the right of midline in the upper abdomen with tip near inferior vena cava-right atrium junction, stable. No pneumothorax evident. There is widespread airspace consolidation throughout both lungs with bilateral pleural effusions. Heart appears enlarged. The pulmonary vascularity is obscured by consolidation. No bone lesions are appreciable. The airspace opacity obscures the hila and mediastinum sufficiently to exclude assessment for potential adenopathy by radiography. IMPRESSION: Tube and catheter positions as described without evident pneumothorax. Widespread airspace consolidation with pleural effusions bilaterally. There is diffuse opacification of most lung regions bilaterally. Heart appears prominent with cardiac borders obscured by effusion and consolidation. Appearance similar to 1 day prior. Electronically Signed   By: Lowella Grip III M.D.   On: 08/20/2019 07:57     Medications:     Scheduled Medications: . artificial tears  1 application Both Eyes D2K  . vitamin C  500 mg Per Tube Daily  . chlorhexidine gluconate (MEDLINE KIT)  15 mL Mouth Rinse BID  . Chlorhexidine Gluconate Cloth  6 each Topical Daily  . diazepam  20 mg  Oral Q6H  . feeding supplement (PRO-STAT SUGAR FREE 64)  60 mL Per Tube BID  . free water  300 mL Per Tube Q8H  . gabapentin  300 mg Per Tube Q8H  . HYDROmorphone  8 mg Per Tube Q4H  . insulin aspart  3-9 Units Subcutaneous Q4H  . mouth rinse  15 mL Mouth Rinse 10 times per day  . oxymetazoline  1 spray Each Nare BID  . pantoprazole sodium  40 mg Per Tube QHS  . QUEtiapine  200 mg Per Tube BID  . sodium chloride flush  10-40 mL Intracatheter Q12H  . sodium chloride flush  10-40 mL Intracatheter Q12H  . Thrombi-Pad  1 each Topical Once  . zinc sulfate  220 mg Per Tube Daily    Infusions: . sodium chloride    . sodium chloride 10 mL/hr at 08/21/19 1243  . albumin human 12.5 g (08/21/19 1015)  . ceFEPime (MAXIPIME) IV Stopped (08/21/19 0830)  . [START ON 08/22/2019] cefTRIAXone (ROCEPHIN)  IV    . cisatracurium (NIMBEX) infusion 5.5 mcg/kg/min (08/21/19 1251)  . dexmedetomidine (PRECEDEX) IV infusion 1 mcg/kg/hr (08/21/19 1239)  . dextrose    . feeding supplement (PIVOT 1.5 CAL) 1,000 mL (08/21/19 1232)  . heparin 2,100 Units/hr (08/21/19 1254)  . HYDROmorphone 10 mg/hr (08/21/19 1253)  . midazolam (VERSED) infusion 15 mg/hr (08/21/19 1249)  . norepinephrine (LEVOPHED) Adult infusion 6 mcg/min (08/21/19 1255)  . propofol (DIPRIVAN) infusion Stopped (08/21/19 1030)  . vancomycin Stopped (08/21/19 0936)    PRN Medications: Place/Maintain arterial line **AND** sodium chloride, sodium chloride, acetaminophen (TYLENOL) oral liquid 160 mg/5 mL, acetaminophen, albumin human, dextrose, hydrALAZINE, HYDROmorphone, labetalol, magnesium hydroxide, metoprolol tartrate, midazolam, ondansetron (ZOFRAN) IV, senna-docusate, sodium chloride, sodium chloride flush, sodium chloride flush, sodium chloride flush   Assessment/Plan:    1. Acute hypoxic/hypercarbic respiratory failure due to COVID PNA: Remained markedly hypoxic despite full support. Intubated 12/26. S/p bilateral CTs 12/26 for  pneumothoraces.  TEE on 12/26 LVEF 70% RV ok. VV ECMO begun 12/26. Day #9  VV ECMO. Tracheostomy 12/29.  COVID ARDS at this point.  Flows stable and sats improved with deeper sedation and re-paralysis. Ecmo circuit changed 1/8 - ECMO parameters stable.  - Failed wean of sedation again yesterday. Requiring very, very high dose regimen. Will ask anesthesia for their assistance with sedation regimen as we would like to keep off propofol. Continue versed, dilaudid and Precedex for now. Would not wean again until we have evidence of lung recovery with increasing TVs - Cannula position reviewed on CXR. stable - Volume status back up. Will give lasix 20IV qod for now  - Sats improved in mid 90s after bronch TVs 60-70s Driving pressure 17-61 range. Continue to support with PC. Suspect TVs will slowly trend up as lung complaince improves - Continues to have epistaxis.With lung clots. Continue bronch daily. ENT following - Continue lung rest.  - PLTs 61k -> 69k -> 85k -> 89k. LDH 325-> 346 -> 368 - Lactic acid down. Co-ox ok.    2. Bilateral PTX: Due to barotrauma.  - Driving pressure minimized at 12-13. Stable this am No change  3. COVID PNA: management of COVID infection per CCM. CXR with bilateral multifocal PNA progressing to likely ARDS.  - s/p 10 days of remdesivir - 12/16, 12/2 s/p tociluzimab - 12/17 s/p convalescent plasma - Decadron at 7m/daily completed 1/11 - On cefepime and vanc completing today.   4. ID: Empiric coverage with vancomycin/cefepime initially for possible secondary bacterial PNA, course completed.   - now back on cefepime and vanc for + cx with staph epidimidis, MSSA and serratia (sensitivities ok). Completing today - Cover with ceftriaxone for nasal packing - Remains stable - D/w PharmD  5. Thrombocytopenia: Slow decrease in platelets, likely low level hemolysis + critical illness.  - 89k ths am.  - Back on heparin. Heparin dosing d/w PharmD  6. Anemia: Bleeding at  trach site resolved but still with epistaxis - hgb 10.2 -> 9.2 -> 9.0 -> 9.5 ->9.2 - Transfuse hgb < 8   7. FEN - continue TFs  D/w with ECMO team (ECMO coordinator/specialists, CCM, TCTS and pharmD) at bedside on multidisciplinary rounds. Continue to await ARDS recovery. Details as above.   Length of Stay: 30   CRITICAL CARE Performed by: DGlori Bickers Total critical care time: 35 minutes  Critical care time was exclusive of separately billable procedures and treating other patients.  Critical care was necessary to treat or prevent imminent or life-threatening deterioration.  Critical care was time spent personally by me on the following activities: development of treatment plan with patient and/or surrogate as well as nursing, discussions with consultants, evaluation of patient's response to treatment, examination of patient, obtaining history from patient or surrogate, ordering and performing treatments and interventions, ordering and review of laboratory studies, ordering and review of radiographic studies, pulse oximetry and re-evaluation of patient's condition.    DGlori BickersMD 08/21/2019, 1:05 PM  Advanced Heart Failure Team Pager [phone](M-F; 7a - 4p)  Please contact [facility] for night-coverage after hours (4p -7a ) and weekends on [email or web address]

## 2019-08-21 NOTE — Procedures (Signed)
Bronchoscopy  Indication:Mucus plugging  Consent: Signed in chart  Anesthesia: In place for ECMO  Procedure - Timeout performed - Bronchoscope advanced through ETT - Findings as below  Findings - Complete occlusion of right and left mainstem bronchi with blood clots, successfully suctioned - Underlying mucosa looks mildly irritated - Good trach position  Specimen(s): none  Complications: none immediate

## 2019-08-21 NOTE — Progress Notes (Signed)
Date and time results received: 08/21/19 1227 (use smartphrase ".now" to insert current time)  Test: ABG Critical Value: Results for [name], [name] (MRN [id number]) as of 08/21/2019 12:35  Ref. Range 08/21/2019 12:20  Sample type Unknown ARTERIAL  pH, Arterial Latest Ref Range: 7.350 - 7.450  7.333 (L)  pCO2 arterial Latest Ref Range: 32.0 - 48.0 mmHg 48.4 (H)  pO2, Arterial Latest Ref Range: 83.0 - 108.0 mmHg 44.0 (L)  TCO2 Latest Ref Range: 22 - 32 mmol/L 27  Bicarbonate Latest Ref Range: 20.0 - 28.0 mmol/L 25.9  O2 Saturation Latest Units: % 78.0  Patient temperature Unknown 36.5 C  Sodium Latest Ref Range: 135 - 145 mmol/L 143  Potassium Latest Ref Range: 3.5 - 5.1 mmol/L 4.4  Calcium Ionized Latest Ref Range: 1.15 - 1.40 mmol/L 1.28  Hemoglobin Latest Ref Range: 13.0 - 17.0 g/dL 9.2 (L)  HCT Latest Ref Range: 39.0 - 52.0 % 27.0 (L)    Name of Provider Notified: Dr. Tamala Julian  Orders Received? Or Actions Taken?: Increase FiO2 to 60%

## 2019-08-21 NOTE — Progress Notes (Signed)
After bronch and vent setting adjustments, pt's O2 sats remaining 85-87% and BP also low with MAP 54-58. Dr. Tamala Julian notified. New orders received. Stated to keep O2 sats >85%. PRN albumin given. Levo drip restarted at 1110 to maintain MAP >65.  Joellen Jersey, RN

## 2019-08-22 ENCOUNTER — Inpatient Hospital Stay (HOSPITAL_COMMUNITY): Payer: Managed Care, Other (non HMO)

## 2019-08-22 DIAGNOSIS — L8990 Pressure ulcer of unspecified site, unspecified stage: Secondary | ICD-10-CM

## 2019-08-22 HISTORY — DX: Pressure ulcer of unspecified site, unspecified stage: L89.90

## 2019-08-22 LAB — BASIC METABOLIC PANEL
Anion gap: 9 (ref 5–15)
Anion gap: 7 (ref 5–15)
BUN: 37 mg/dL — ABNORMAL HIGH (ref 6–20)
BUN: 40 mg/dL — ABNORMAL HIGH (ref 6–20)
CO2: 25 mmol/L (ref 22–32)
CO2: 26 mmol/L (ref 22–32)
Calcium: 8.9 mg/dL (ref 8.9–10.3)
Calcium: 9 mg/dL (ref 8.9–10.3)
Chloride: 110 mmol/L (ref 98–111)
Chloride: 111 mmol/L (ref 98–111)
Creatinine, Ser: 0.44 mg/dL — ABNORMAL LOW (ref 0.61–1.24)
Creatinine, Ser: 0.48 mg/dL — ABNORMAL LOW (ref 0.61–1.24)
GFR calc Af Amer: >60 mL/min (ref >60)
GFR calc Af Amer: >60 mL/min (ref >60)
GFR calc non Af Amer: >60 mL/min (ref >60)
GFR calc non Af Amer: >60 mL/min (ref >60)
Glucose, Bld: 160 mg/dL — ABNORMAL HIGH (ref 70–99)
Glucose, Bld: 126 mg/dL — ABNORMAL HIGH (ref 70–99)
Potassium: 4.1 mmol/L (ref 3.5–5.1)
Potassium: 4.3 mmol/L (ref 3.5–5.1)
Sodium: 144 mmol/L (ref 135–145)
Sodium: 144 mmol/L (ref 135–145)

## 2019-08-22 LAB — TYPE AND SCREEN
ABO/RH(D): O POS
Antibody Screen: NEGATIVE
Unit division: 0
Unit division: 0
Unit division: 0
Unit division: 0

## 2019-08-22 LAB — TRIGLYCERIDES: Triglycerides: 187 mg/dL — ABNORMAL HIGH (ref <150)

## 2019-08-22 LAB — COOXEMETRY PANEL
Carboxyhemoglobin: 3.7 % — ABNORMAL HIGH (ref 0.5–1.5)
Methemoglobin: 1.1 % (ref 0.0–1.5)
O2 Saturation: 72.6 %
Total hemoglobin: 7.3 g/dL — ABNORMAL LOW (ref 12.0–16.0)

## 2019-08-22 LAB — CBC WITH DIFFERENTIAL/PLATELET
Abs Immature Granulocytes: 0.5 10*3/uL — ABNORMAL HIGH (ref 0.00–0.07)
Basophils Absolute: 0 10*3/uL (ref 0.0–0.1)
Basophils Relative: 1 %
Eosinophils Absolute: 0.3 10*3/uL (ref 0.0–0.5)
Eosinophils Relative: 5 %
HCT: 28 % — ABNORMAL LOW (ref 39.0–52.0)
Hemoglobin: 8.5 g/dL — ABNORMAL LOW (ref 13.0–17.0)
Immature Granulocytes: 10 %
Lymphocytes Relative: 28 %
Lymphs Abs: 1.4 10*3/uL (ref 0.7–4.0)
MCH: 29.5 pg (ref 26.0–34.0)
MCHC: 30.4 g/dL (ref 30.0–36.0)
MCV: 97.2 fL (ref 80.0–100.0)
Monocytes Absolute: 0.6 10*3/uL (ref 0.1–1.0)
Monocytes Relative: 12 %
Neutro Abs: 2.2 10*3/uL (ref 1.7–7.7)
Neutrophils Relative %: 44 %
Platelets: 84 10*3/uL — ABNORMAL LOW (ref 150–400)
RBC: 2.88 MIL/uL — ABNORMAL LOW (ref 4.22–5.81)
RDW: 18.3 % — ABNORMAL HIGH (ref 11.5–15.5)
WBC: 5 10*3/uL (ref 4.0–10.5)
nRBC: 0.4 % — ABNORMAL HIGH (ref 0.0–0.2)

## 2019-08-22 LAB — BPAM RBC
Blood Product Expiration Date: 202102042359
Blood Product Expiration Date: 202102042359
Blood Product Expiration Date: 202102042359
Blood Product Expiration Date: 202102042359
ISSUE DATE / TIME: 202101042010
ISSUE DATE / TIME: 202101042010
ISSUE DATE / TIME: 202101042010
ISSUE DATE / TIME: 202101042010
Unit Type and Rh: 5100
Unit Type and Rh: 5100
Unit Type and Rh: 5100
Unit Type and Rh: 5100

## 2019-08-22 LAB — HEPARIN LEVEL (UNFRACTIONATED)
Heparin Unfractionated: 0.33 IU/mL (ref 0.30–0.70)
Heparin Unfractionated: 0.28 IU/mL — ABNORMAL LOW (ref 0.30–0.70)

## 2019-08-22 LAB — POCT I-STAT 7, (LYTES, BLD GAS, ICA,H+H)
Acid-Base Excess: 1 mmol/L (ref 0.0–2.0)
Acid-Base Excess: 2 mmol/L (ref 0.0–2.0)
Acid-base deficit: 3 mmol/L — ABNORMAL HIGH (ref 0.0–2.0)
Bicarbonate: 25.8 mmol/L (ref 20.0–28.0)
Bicarbonate: 24.9 mmol/L (ref 20.0–28.0)
Bicarbonate: 25.7 mmol/L (ref 20.0–28.0)
Bicarbonate: 26.5 mmol/L (ref 20.0–28.0)
Bicarbonate: 22.5 mmol/L (ref 20.0–28.0)
Calcium, Ion: 1.31 mmol/L (ref 1.15–1.40)
Calcium, Ion: 1.31 mmol/L (ref 1.15–1.40)
Calcium, Ion: 1.31 mmol/L (ref 1.15–1.40)
Calcium, Ion: 1.3 mmol/L (ref 1.15–1.40)
Calcium, Ion: 1.26 mmol/L (ref 1.15–1.40)
HCT: 26 % — ABNORMAL LOW (ref 39.0–52.0)
HCT: 25 % — ABNORMAL LOW (ref 39.0–52.0)
HCT: 24 % — ABNORMAL LOW (ref 39.0–52.0)
HCT: 27 % — ABNORMAL LOW (ref 39.0–52.0)
HCT: 23 % — ABNORMAL LOW (ref 39.0–52.0)
Hemoglobin: 8.8 g/dL — ABNORMAL LOW (ref 13.0–17.0)
Hemoglobin: 8.5 g/dL — ABNORMAL LOW (ref 13.0–17.0)
Hemoglobin: 8.2 g/dL — ABNORMAL LOW (ref 13.0–17.0)
Hemoglobin: 9.2 g/dL — ABNORMAL LOW (ref 13.0–17.0)
Hemoglobin: 7.8 g/dL — ABNORMAL LOW (ref 13.0–17.0)
O2 Saturation: 83 %
O2 Saturation: 84 %
O2 Saturation: 100 %
O2 Saturation: 84 %
O2 Saturation: 82 %
Patient temperature: 98
Patient temperature: 36.7
Patient temperature: 36.4
Patient temperature: 36.4
Patient temperature: 98.8
Potassium: 4.1 mmol/L (ref 3.5–5.1)
Potassium: 4 mmol/L (ref 3.5–5.1)
Potassium: 4 mmol/L (ref 3.5–5.1)
Potassium: 3.9 mmol/L (ref 3.5–5.1)
Potassium: 3.8 mmol/L (ref 3.5–5.1)
Sodium: 144 mmol/L (ref 135–145)
Sodium: 145 mmol/L (ref 135–145)
Sodium: 145 mmol/L (ref 135–145)
Sodium: 145 mmol/L (ref 135–145)
Sodium: 146 mmol/L — ABNORMAL HIGH (ref 135–145)
TCO2: 27 mmol/L (ref 22–32)
TCO2: 26 mmol/L (ref 22–32)
TCO2: 27 mmol/L (ref 22–32)
TCO2: 28 mmol/L (ref 22–32)
TCO2: 24 mmol/L (ref 22–32)
pCO2 arterial: 47.6 mmHg (ref 32.0–48.0)
pCO2 arterial: 38.8 mmHg (ref 32.0–48.0)
pCO2 arterial: 39.2 mmHg (ref 32.0–48.0)
pCO2 arterial: 41.9 mmHg (ref 32.0–48.0)
pCO2 arterial: 43 mmHg (ref 32.0–48.0)
pH, Arterial: 7.34 — ABNORMAL LOW (ref 7.350–7.450)
pH, Arterial: 7.414 (ref 7.350–7.450)
pH, Arterial: 7.422 (ref 7.350–7.450)
pH, Arterial: 7.408 (ref 7.350–7.450)
pH, Arterial: 7.327 — ABNORMAL LOW (ref 7.350–7.450)
pO2, Arterial: 50 mmHg — ABNORMAL LOW (ref 83.0–108.0)
pO2, Arterial: 48 mmHg — ABNORMAL LOW (ref 83.0–108.0)
pO2, Arterial: 355 mmHg — ABNORMAL HIGH (ref 83.0–108.0)
pO2, Arterial: 46 mmHg — ABNORMAL LOW (ref 83.0–108.0)
pO2, Arterial: 50 mmHg — ABNORMAL LOW (ref 83.0–108.0)

## 2019-08-22 LAB — CBC
HCT: 27.3 % — ABNORMAL LOW (ref 39.0–52.0)
HCT: 27.7 % — ABNORMAL LOW (ref 39.0–52.0)
Hemoglobin: 8.1 g/dL — ABNORMAL LOW (ref 13.0–17.0)
Hemoglobin: 8.2 g/dL — ABNORMAL LOW (ref 13.0–17.0)
MCH: 29 pg (ref 26.0–34.0)
MCH: 28.7 pg (ref 26.0–34.0)
MCHC: 29.7 g/dL — ABNORMAL LOW (ref 30.0–36.0)
MCHC: 29.6 g/dL — ABNORMAL LOW (ref 30.0–36.0)
MCV: 97.8 fL (ref 80.0–100.0)
MCV: 96.9 fL (ref 80.0–100.0)
Platelets: 90 10*3/uL — ABNORMAL LOW (ref 150–400)
Platelets: 83 10*3/uL — ABNORMAL LOW (ref 150–400)
RBC: 2.79 MIL/uL — ABNORMAL LOW (ref 4.22–5.81)
RBC: 2.86 MIL/uL — ABNORMAL LOW (ref 4.22–5.81)
RDW: 18.3 % — ABNORMAL HIGH (ref 11.5–15.5)
RDW: 18.1 % — ABNORMAL HIGH (ref 11.5–15.5)
WBC: 5.4 10*3/uL (ref 4.0–10.5)
WBC: 5.2 10*3/uL (ref 4.0–10.5)
nRBC: 0.6 % — ABNORMAL HIGH (ref 0.0–0.2)
nRBC: 1.1 % — ABNORMAL HIGH (ref 0.0–0.2)

## 2019-08-22 LAB — CULTURE, BLOOD (ROUTINE X 2)
Culture: NO GROWTH
Culture: NO GROWTH
Special Requests: ADEQUATE

## 2019-08-22 LAB — HEPATIC FUNCTION PANEL
ALT: 20 U/L (ref 0–44)
AST: 15 U/L (ref 15–41)
Albumin: 3.2 g/dL — ABNORMAL LOW (ref 3.5–5.0)
Alkaline Phosphatase: 46 U/L (ref 38–126)
Bilirubin, Direct: 0.2 mg/dL (ref 0.0–0.2)
Indirect Bilirubin: 0.6 mg/dL (ref 0.3–0.9)
Total Bilirubin: 0.8 mg/dL (ref 0.3–1.2)
Total Protein: 6 g/dL — ABNORMAL LOW (ref 6.5–8.1)

## 2019-08-22 LAB — LACTATE DEHYDROGENASE: LDH: 303 U/L — ABNORMAL HIGH (ref 98–192)

## 2019-08-22 LAB — APTT
aPTT: 121 seconds — ABNORMAL HIGH (ref 24–36)
aPTT: 108 seconds — ABNORMAL HIGH (ref 24–36)

## 2019-08-22 LAB — GLUCOSE, CAPILLARY
Glucose-Capillary: 119 mg/dL — ABNORMAL HIGH (ref 70–99)
Glucose-Capillary: 123 mg/dL — ABNORMAL HIGH (ref 70–99)
Glucose-Capillary: 140 mg/dL — ABNORMAL HIGH (ref 70–99)
Glucose-Capillary: 142 mg/dL — ABNORMAL HIGH (ref 70–99)
Glucose-Capillary: 149 mg/dL — ABNORMAL HIGH (ref 70–99)
Glucose-Capillary: 152 mg/dL — ABNORMAL HIGH (ref 70–99)
Glucose-Capillary: 152 mg/dL — ABNORMAL HIGH (ref 70–99)

## 2019-08-22 LAB — FIBRINOGEN: Fibrinogen: 573 mg/dL — ABNORMAL HIGH (ref 210–475)

## 2019-08-22 LAB — PROTIME-INR
INR: 1.2 (ref 0.8–1.2)
Prothrombin Time: 14.8 seconds (ref 11.4–15.2)

## 2019-08-22 LAB — LACTIC ACID, PLASMA: Lactic Acid, Venous: 1 mmol/L (ref 0.5–1.9)

## 2019-08-22 MED ORDER — DIAZEPAM 5 MG PO TABS
20.0000 mg | ORAL_TABLET | Freq: Four times a day (QID) | ORAL | Status: DC
  Administered 2019-08-22 – 2019-09-08 (×63): 20 mg
  Filled 2019-08-22 (×63): qty 4

## 2019-08-22 MED ORDER — ROCURONIUM BROMIDE 50 MG/5ML IV SOLN
80.0000 mg | Freq: Once | INTRAVENOUS | Status: AC
  Administered 2019-08-22: 09:00:00 80 mg via INTRAVENOUS
  Filled 2019-08-22 (×2): qty 8

## 2019-08-22 MED ORDER — FUROSEMIDE 10 MG/ML IJ SOLN
20.0000 mg | Freq: Once | INTRAMUSCULAR | Status: AC
  Administered 2019-08-22: 13:00:00 20 mg via INTRAVENOUS

## 2019-08-22 MED ORDER — ROCURONIUM BROMIDE 50 MG/5ML IV SOLN
100.0000 mg | Freq: Once | INTRAVENOUS | Status: AC
  Administered 2019-08-22: 13:00:00 100 mg via INTRAVENOUS
  Filled 2019-08-22: qty 10

## 2019-08-22 MED ORDER — DIAZEPAM 5 MG PO TABS
20.0000 mg | ORAL_TABLET | Freq: Four times a day (QID) | ORAL | Status: DC
  Administered 2019-08-22: 18:00:00 20 mg via ORAL

## 2019-08-22 NOTE — Progress Notes (Signed)
NAME:  [name], MRN:  [id number], DOB:  Feb 21, 1976, LOS: 52 ADMISSION DATE:  07/22/2019, CONSULTATION DATE:  12/24 REFERRING MD:  Sloan Leiter, CHIEF COMPLAINT:  Dyspnea   Brief History   44 y/o M admitted 12/14 with COVID pneumonia causing acute hypoxemic respiratory failure. Developed pneumomediastinum 12/23 with concern for possible bilateral small pneumothoraces on 12/24.  PCCM consulted for evaluation of pneumothoraces  Past Medical History  GERD HTN Asthma  Significant Hospital Events   12/14 admit with hypoxemic respiratory failure in setting of COVID-19 pneumonia 12/23 noted to have pneumomediastinum on chest x-ray 12/24 PCCM consulted for evaluation 12/26 worsening hypoxemia, intubated  12/27 VV fem/fem ECMO cannulation 12/28 ETT switch 12/29 Tracheostomy 12/31 Oozing from multiple sites, H/H drop. Heparin held briefly and lidocaine with epi injected around trach site, Cryo, DDVAP given 1/3 CVL being discontinued  1/5: repacked nares 1/7: repacked nares by ENT 1/8: trach exchange distal XLT 1/11: bronch  Consults:  PCCM  Procedures:  1/4 bronch: bal performed  Significant Diagnostic Tests:  CT chest 12/22 >> extensive pneumomediastinum, multifocal patchy bilateral groundglass opacities  Micro Data:  BCx2 12/14 >> negative  Sputum 12/15 >> negative  1/9 bronch: serratia and MSSA 1/2 acid fast smear bronch: negative 1/2 fungal cx bronch: pending 1/12 BAL >>  Antimicrobials:  Received remdesivir, steroids, actemra and convalescent plasma  5 days cefepime 12/24-12/29 Cefepime 1/4-> vanc 1/4->   Interim history/subjective:  Saturations worse today but everything else stable.  Unable to keep even fluid wise due to chugging.  Objective   Blood pressure (!) 112/46, pulse 74, temperature 98.1 F (36.7 C), resp. rate (!) 0, height _0  (1.727 m), weight 133.3 kg, SpO2 (!) 85 %. CVP:  [12 mmHg] 12 mmHg  Vent Mode: PCV FiO2 (%):  [50 %-100 %] 50 % Set  Rate:  [20 bmp] 20 bmp PEEP:  [12 cmH20-15 cmH20] 12 cmH20 Plateau Pressure:  [24 cmH20-26 cmH20] 26 cmH20   Intake/Output Summary (Last 24 hours) at 08/22/2019 0805 Last data filed at 08/22/2019 0700 Gross per 24 hour  Intake 7610.31 ml  Output 4450 ml  Net 3160.31 ml   Filed Weights   08/20/19 0500 08/21/19 0500 08/22/19 0500  Weight: 129.9 kg 131.3 kg 133.3 kg   Examination GEN: obese ill man on vent HEENT: trach in place, small bloody sedations, nasal packing in place bilaterally, minimal oozing CV: RRR, ext warm PULM: severely diminished but present bilaterally GI: Soft, + BS EXT: Global anascarca NEURO: sedated, no withdrawing PSYCH: RASS -4 SKIN: pale Femoral cannulas in place, sites look okay Bilateral chest tubes in place, no tidaling or air leak, ongoing serosanguinous output Foley in place dark yellow urine  CXR stable CMP looks good Sugars look good LDH stable in 300s ABG with slightly worsened hypoxemia Heparin 0.33 (goal 0.3-0.5) CBC stable, plts improved Lactate 1  Resolved Hospital Problem list    Assessment & Plan:    Critically ill due to acute hypoxemic respiratory failure with ARDS secondary to COVID-19, s/p tracheostomy requiring mechanical ventilation and VV ECMO support. Continue ECMO at current flow rate.  Bronchoscopy for secretion clearance as needed Continue to follow Vt on PC. Keep Driving pressure 15 of less. Intermittent diuresis, hold today Continues to desaturate and chug with minimal stimulation.  Continue current level enteral sedation. Versed/precedex/dilaudid gtts with orals as written, keep heavily sedated until TV improve Premedicate with Dilaudid prior to turns. Heparin with 0.3-0.5 goal  Mild ongoing hypoxemia is independent of FiO2 and  PEEP on vent, due to mixing cloud and high intrinsic cardiac output, agree with cannula change tentatively planned for tomorrow; mostly for infection prevention and allowing easier patient  mobility with removal of femoral cannulas (as despite the hypoxemia, still neurologically intact with sedation weans and normal lactate)  MSSA and serratia marcescens pneumonia:  cefepime and vancomycin for 10 days Ceftriaxone thereafter with duration TBD for TSS ppx while nasal packing in place  Bilateral pneumothoraces and pneumomediastinum No leak today Continue suction  -20cmH2O  Posterior epistaxis.  repacked 1/7 after discussion with ENT Repacked 1/13 with afrin qday Next repacking due 08/26/19   Daily Goals Checklist  Pain/Anxiety/Delirium protocol (if indicated): morphine and valium seroquel enterally. Dilaudid, propofol, precedex, versed infusions. VAP protocol (if indicated): Bundle in place. Respiratory support goals: Continue PCV with ultra-lung protective ventilation  Blood pressure target:  MAP>65  DVT prophylaxis: on systemic anticoagulation on VV-ECMO -  Nutrition Status: Nutrition Problem: Increased nutrient needs Etiology: acute illness(COVID PNA) Signs/Symptoms: estimated needs Interventions: Tube feeding, Prostat GI prophylaxis: Protonix per tube. Fluid status goals: diuresis to achieve - 1 to -1.5. (still net even by I/O) Urinary catheter: Guide hemodynamic management Glucose control: euglycemic on SSI. Mobility/therapy needs: Bedrest.  Antibiotic de-escalation: complete 7 days of antibiotics for pneumonia. Home medication reconciliation: reviewed. Daily labs: as per ECMO protocol. Code Status: Full code  Family Communication: will try to reach out today Disposition: ICU   Labs   CBC: Recent Labs  Lab 08/18/19 0414 08/18/19 0417 08/19/19 0439 08/19/19 0447 08/20/19 0444 08/20/19 0834 08/20/19 1655 08/20/19 2020 08/21/19 0203 08/21/19 0510 08/21/19 1621 08/21/19 2033 08/22/19 0336 08/22/19 0347 08/22/19 0720  WBC 7.2   < > 7.0   < > 5.6  --  5.7  --  7.8  --  5.3  --  5.0  --   --   NEUTROABS 2.9  --  2.8  --  1.9  --   --   --  4.9  --    --   --  2.2  --   --   HGB 9.2*   < > 9.0*   < > 9.5*   < > 9.5*   < > 10.0*   < > 8.7* 8.5* 8.5* 8.8* 8.5*  HCT 30.6*   < > 30.4*   < > 31.7*   < > 31.9*   < > 33.7*   < > 29.2* 25.0* 28.0* 26.0* 25.0*  MCV 97.1   < > 100.3*   < > 98.4  --  98.8  --  97.7  --  98.3  --  97.2  --   --   PLT 61*   < > 69*   < > 85*  --  86*  --  89*  --  83*  --  84*  --   --    < > = values in this interval not displayed.    Basic Metabolic Panel: Recent Labs  Lab 08/20/19 0444 08/20/19 0834 08/20/19 1655 08/20/19 2020 08/21/19 0203 08/21/19 0510 08/21/19 1621 08/21/19 2033 08/22/19 0336 08/22/19 0347 08/22/19 0720  NA 146*   < > 146*   < > 143   < > 141 145 144 144 145  K 4.3   < > 3.7   < > 4.1   < > 4.2 3.8 4.1 4.1 4.0  CL 114*  --  115*  --  110  --  107  --  110  --   --  CO2 23  --  25  --  25  --  26  --  25  --   --   GLUCOSE 135*  --  132*  --  160*  --  153*  --  160*  --   --   BUN 33*  --  36*  --  36*  --  37*  --  37*  --   --   CREATININE 0.61  --  0.41*  --  0.37*  --  0.43*  --  0.44*  --   --   CALCIUM 8.7*  --  8.7*  --  8.7*  --  9.0  --  8.9  --   --    < > = values in this interval not displayed.   GFR: Estimated Creatinine Clearance: 155.7 mL/min (A) (by C-G formula based on SCr of 0.44 mg/dL (L)). Recent Labs  Lab 08/19/19 0439 08/19/19 1600 08/20/19 0444 08/20/19 0457 08/20/19 1655 08/21/19 0203 08/21/19 1621 08/22/19 0336  WBC 7.0   < >   < >  --  5.7 7.8 5.3 5.0  LATICACIDVEN 1.7  --   --  1.2  --  1.2  --  1.0   < > = values in this interval not displayed.    Liver Function Tests: Recent Labs  Lab 08/18/19 0414 08/19/19 0439 08/20/19 0444 08/21/19 0203 08/22/19 0336  AST 13* 14* 13* 17 15  ALT _0 ALKPHOS 49 48 46 50 46  BILITOT 0.4 0.7 0.7 0.8 0.8  PROT 5.2* 5.4* 5.6* 5.9* 6.0*  ALBUMIN 2.7* 2.7* 2.6* 2.8* 3.2*   No results for input(s): LIPASE, AMYLASE in the last 168 hours. No results for input(s): AMMONIA in the last 168  hours.  ABG    Component Value Date/Time   PHART 7.414 08/22/2019 0720   PCO2ART 38.8 08/22/2019 0720   PO2ART 48.0 (L) 08/22/2019 0720   HCO3 24.9 08/22/2019 0720   TCO2 26 08/22/2019 0720   ACIDBASEDEF 1.0 08/21/2019 2033   O2SAT 84.0 08/22/2019 0720     Coagulation Profile: Recent Labs  Lab 08/18/19 0414 08/19/19 0439 08/20/19 0444 08/21/19 0203 08/22/19 0336  INR 1.1 1.2 1.2 1.1 1.2    Cardiac Enzymes: No results for input(s): CKTOTAL, CKMB, CKMBINDEX, TROPONINI in the last 168 hours.  HbA1C: Hgb A1c MFr Bld  Date/Time Value Ref Range Status  07/24/2019 04:50 AM 6.7 (H) 4.8 - 5.6 % Final    Comment:    (NOTE) Pre diabetes:          5.7%-6.4% Diabetes:              >6.4% Glycemic control for   <7.0% adults with diabetes   09/30/2017 05:36 AM 5.7 (H) 4.8 - 5.6 % Final    Comment:    (NOTE)         Prediabetes: 5.7 - 6.4         Diabetes: >6.4         Glycemic control for adults with diabetes: <7.0     CBG: Recent Labs  Lab 08/21/19 1206 08/21/19 1617 08/21/19 2030 08/21/19 2359 08/22/19 0345  GLUCAP 124* 143* 104* 119* 149*     Critical care time:     CRITICAL CARE   The patient is critically ill with multiple organ systems failure and requires high complexity decision making for assessment and support, frequent evaluation and titration of therapies, application of advanced monitoring technologies and  extensive interpretation of multiple databases. Critical Care Time devoted to patient care services described in this note independent of APP/resident time (if applicable)  is 34 minutes.   Erskine Emery MD [facility] 08/22/2019 8:05 AM Personal pager: [phone] If unanswered, please page CCM On-call: [phone]    08/22/2019, 8:05 AM

## 2019-08-22 NOTE — Progress Notes (Signed)
Daily Progress Note   Patient Name: [name]       Date: 08/22/2019 DOB: 1975/11/25  Age: 44 y.o. MRN#: [id number] Attending Physician: Wonda Olds, MD Primary Care Physician: Tamsen Roers, MD Admit Date: 07/22/2019  Reason for Consultation/Follow-up: Establishing goals of care  Subjective: Noted pt with low sats today- improved after bronch with findings of complete occlusion of R mainstem bronchi with blood clots that were suctioned out, L mainstem with less plugs. BP decrease with attempts and diuresis- started back on pressor.  Spoke with Mom at bedside and wife by phone- wife to bring FMLA papers for herself- palliative team will assist with completion.    Review of Systems  Unable to perform ROS: Acuity of condition    Length of Stay: 31  Current Medications: Scheduled Meds:  . artificial tears  1 application Both Eyes W7K  . vitamin C  500 mg Per Tube Daily  . chlorhexidine gluconate (MEDLINE KIT)  15 mL Mouth Rinse BID  . Chlorhexidine Gluconate Cloth  6 each Topical Daily  . diazepam  20 mg Oral Q6H  . feeding supplement (PRO-STAT SUGAR FREE 64)  60 mL Per Tube BID  . free water  300 mL Per Tube Q8H  . gabapentin  300 mg Per Tube Q8H  . HYDROmorphone  8 mg Per Tube Q4H  . insulin aspart  3-9 Units Subcutaneous Q4H  . mouth rinse  15 mL Mouth Rinse 10 times per day  . oxymetazoline  1 spray Each Nare BID  . pantoprazole sodium  40 mg Per Tube QHS  . QUEtiapine  200 mg Per Tube BID  . sodium chloride flush  10-40 mL Intracatheter Q12H  . sodium chloride flush  10-40 mL Intracatheter Q12H  . Thrombi-Pad  1 each Topical Once  . zinc sulfate  220 mg Per Tube Daily    Continuous Infusions: . sodium chloride    . sodium chloride 10 mL/hr at 08/22/19 0700  .  albumin human Stopped (08/22/19 0400)  . cefTRIAXone (ROCEPHIN)  IV 1 g (08/22/19 1026)  . cisatracurium (NIMBEX) infusion Stopped (08/21/19 2109)  . dexmedetomidine (PRECEDEX) IV infusion 1.2 mcg/kg/hr (08/22/19 1300)  . dextrose    . feeding supplement (PIVOT 1.5 CAL) 1,000 mL (08/21/19 2119)  . heparin 2,100 Units/hr (08/22/19 0700)  .  HYDROmorphone 20 mg/hr (08/22/19 1300)  . midazolam (VERSED) infusion 19 mg/hr (08/22/19 0700)  . norepinephrine (LEVOPHED) Adult infusion Stopped (08/22/19 0445)  . propofol (DIPRIVAN) infusion Stopped (08/21/19 1030)    PRN Meds: Place/Maintain arterial line **AND** sodium chloride, sodium chloride, acetaminophen (TYLENOL) oral liquid 160 mg/5 mL, acetaminophen, albumin human, dextrose, hydrALAZINE, HYDROmorphone, labetalol, magnesium hydroxide, metoprolol tartrate, midazolam, ondansetron (ZOFRAN) IV, senna-docusate, sodium chloride, sodium chloride flush, sodium chloride flush, sodium chloride flush  Physical Exam Vitals and nursing note reviewed.  Constitutional:      Appearance: He is obese.  HENT:     Nose:     Comments: epistaxis Neurological:     Comments: sedated             Vital Signs: BP (!) 112/46   Pulse 84   Temp 98.1 F (36.7 C)   Resp 13   Ht 5' 8"  (1.727 m)   Wt 133.3 kg   SpO2 (!) 73%   BMI 44.68 kg/m  SpO2: SpO2: (!) 73 % O2 Device: O2 Device: Ventilator O2 Flow Rate: O2 Flow Rate (L/min): 45 L/min(45L 15L NRB)  Intake/output summary:   Intake/Output Summary (Last 24 hours) at 08/22/2019 1328 Last data filed at 08/22/2019 0700 Gross per 24 hour  Intake 5794.2 ml  Output 2925 ml  Net 2869.2 ml   LBM: Last BM Date: 08/21/19 Baseline Weight: Weight: 136.1 kg Most recent weight: Weight: 133.3 kg       Palliative Assessment/Data: PPS: 10%    Flowsheet Rows     Most Recent Value  Intake Tab  Referral Department  Critical care  Unit at Time of Referral  ICU  Palliative Care Primary Diagnosis  Cardiac  Date  Notified  08/08/19  Palliative Care Type  New Palliative care  Reason for referral  -- [ecmo]  Date of Admission  07/22/19  Date first seen by Palliative Care  08/09/19  # of days Palliative referral response time  1 Day(s)  # of days IP prior to Palliative referral  17  Clinical Assessment  Psychosocial & Spiritual Assessment  Palliative Care Outcomes      Patient Active Problem List   Diagnosis Date Noted  . Pressure injury of skin 08/22/2019  . Need for emotional support   . Chest tube in place   . Personal history of ECMO   . Advanced care planning/counseling discussion   . Acute respiratory distress syndrome (ARDS) due to COVID-19 virus ([place])   . Palliative care by specialist   . Goals of care, counseling/discussion   . Advanced directives, counseling/discussion   . Subcutaneous crepitus   . Pneumonia due to COVID-19 virus 07/23/2019  . Acute respiratory failure with hypoxia ([place]) 07/22/2019  . Persistent asthma with undetermined severity   . Thrombocytopenia ([place])   . Leukopenia   . Fever   . Gram positive bacterial infection   . Septic arthritis of right acromioclavicular joint ([place]) 09/29/2017  . Chronic left-sided low back pain with left-sided sciatica 09/19/2017  . Epigastric pain   . Pancreatitis 04/17/2017  . Essential hypertension 04/17/2017  . Moderate persistent asthma 07/21/2015  . Allergic rhinoconjunctivitis 07/21/2015  . GERD (gastroesophageal reflux disease) 07/21/2015  . ABNORMALITY OF GAIT 11/12/2009  . TARSAL TUNNEL SYNDROME, LEFT 10/08/2009  . PES PLANUS 10/08/2009    Palliative Care Assessment & Plan   Patient Profile: 44 y/o M admitted 12/14 with COVIDpneumonia causing acutehypoxemic respiratory failure. Developed pneumomediastinum 12/23 with concern for possible bilateral small pneumothoraces on 12/24.  PCCM consulted for evaluation of pneumothoraces.During admission patient has had placement of bilateral chest tubes, and is now on VV  ECMO, and vent by trach. Recovery has been complicated by ongoing bleeding- frank blood in chest tubes, bleeding from around trach site, epistaxis- has received multiple blood transfusions, platelet transfusion. Also received albumin for volume expansion due to low flow after receiving diuresis- diuresis now on hold. Palliative medicine consulted for support and GOC.    Assessment/Recommendations/Plan  Pt continues on ECMO- awaiting ARDS recovery- GOC to continue ECMO until recovery or some event makes it necessary to stop, or evidence that patient is not recovering [name] to bring FMLA papers- Palliative will assist with completion PMT will continue to follow for family support   Goals of Care and Additional Recommendations: Limitations on Scope of Treatment: Full Scope Treatment  Code Status: Full code  Prognosis:  Unable to determine  Discharge Planning: To Be Determined  Care plan was discussed with patient's spouse and RN.  Thank you for allowing the Palliative Medicine Team to assist in the care of this patient.   Time In: 1300 Time Out: 1335 Total Time 35 mins Prolonged Time Billed no      Greater than 50%  of this time was spent counseling and coordinating care related to the above assessment and plan.  Mariana Kaufman, AGNP-C Palliative Medicine   Please contact Palliative Medicine Team phone at [phone] for questions and concerns.

## 2019-08-22 NOTE — Progress Notes (Addendum)
Patient ID: [name], male   DOB: 1976/01/17, 44 y.o.   MRN: [id number]    Advanced Heart Failure Rounding Note   Subjective:    Circuit changed and trach exchanged on 1/8.   Remains on VV ecmo with good flows.   Sats staying in mid 80s. Having to be positioned nearly upright in bed  TVs remain 50-70 range.   ABG 7.40/44/52/86%  Co-ox 69%   CXR: Diffuse bilateral lung opacities c/w ARDS. Personally reviewed  On vent 50% PEEP 12. PlatP 27-28 Driving pressure 15 No change. TV ~ 50-70 on PC. Personally reviewed   ECMO parameters Flow 5.35 RPM 3900 dP 33 Pressure -117  Objective:   Weight Range:  Vital Signs:   Temp:  [98.1 F (36.7 C)-98.8 F (37.1 C)] 98.1 F (36.7 C) (01/14 1110) Pulse Rate:  [49-98] 84 (01/14 1200) Resp:  [0-23] 13 (01/14 1200) BP: (112-126)/(46-57) 112/46 (01/13 2204) SpO2:  [73 %-96 %] 73 % (01/14 1200) Arterial Line BP: (102-161)/(43-67) 152/63 (01/14 1200) FiO2 (%):  [50 %-100 %] 50 % (01/14 1138) Weight:  [133.3 kg] 133.3 kg (01/14 0500) Last BM Date: 08/21/19  Weight change: Filed Weights   08/20/19 0500 08/21/19 0500 08/22/19 0500  Weight: 129.9 kg 131.3 kg 133.3 kg    Intake/Output:   Intake/Output Summary (Last 24 hours) at 08/22/2019 1306 Last data filed at 08/22/2019 0700 Gross per 24 hour  Intake 5794.2 ml  Output 2925 ml  Net 2869.2 ml     Physical Exam: General:  Sedated on vent through trach HEENT: normal + nasal packing + swollennormal Neck: supple. + trach unable to see JVD. Carotids 2+ bilat; no bruits. No lymphadenopathy or thryomegaly appreciated. Cor: PMI nonpalpable . Regular rate & rhythm. No rubs, gallops or murmurs. Lungs: minimal air movement  Abdomen: obese soft, nontender, nondistended. No hepatosplenomegaly. No bruits or masses. Good bowel sounds. Extremities: no cyanosis, clubbing, rash, 2+ edema  Cannulas ok Neuro: intubated sedated   Telemetry: Sinus 80-90s Personally reviewed  Labs: Basic  Metabolic Panel: Recent Labs  Lab 08/20/19 0444 08/20/19 0834 08/20/19 1655 08/20/19 2020 08/21/19 0203 08/21/19 0510 08/21/19 1621 08/21/19 2033 08/22/19 0336 08/22/19 0347 08/22/19 0720  NA 146*   < > 146*   < > 143   < > 141 145 144 144 145  K 4.3   < > 3.7   < > 4.1   < > 4.2 3.8 4.1 4.1 4.0  CL 114*  --  115*  --  110  --  107  --  110  --   --   CO2 23  --  25  --  25  --  26  --  25  --   --   GLUCOSE 135*  --  132*  --  160*  --  153*  --  160*  --   --   BUN 33*  --  36*  --  36*  --  37*  --  37*  --   --   CREATININE 0.61  --  0.41*  --  0.37*  --  0.43*  --  0.44*  --   --   CALCIUM 8.7*   < > 8.7*   < > 8.7*  --  9.0  --  8.9  --   --    < > = values in this interval not displayed.    Liver Function Tests: Recent Labs  Lab 08/18/19 0414 08/19/19 0439 08/20/19 0444  08/21/19 0203 08/22/19 0336  AST 13* 14* 13* 17 15  ALT _0 ALKPHOS 49 48 46 50 46  BILITOT 0.4 0.7 0.7 0.8 0.8  PROT 5.2* 5.4* 5.6* 5.9* 6.0*  ALBUMIN 2.7* 2.7* 2.6* 2.8* 3.2*   No results for input(s): LIPASE, AMYLASE in the last 168 hours. No results for input(s): AMMONIA in the last 168 hours.  CBC: Recent Labs  Lab 08/18/19 0414 08/18/19 0417 08/19/19 0439 08/19/19 0447 08/20/19 0444 08/20/19 0834 08/20/19 1655 08/20/19 2020 08/21/19 0203 08/21/19 0510 08/21/19 1621 08/21/19 2033 08/22/19 0336 08/22/19 0347 08/22/19 0720  WBC 7.2   < > 7.0   < > 5.6  --  5.7  --  7.8  --  5.3  --  5.0  --   --   NEUTROABS 2.9  --  2.8  --  1.9  --   --   --  4.9  --   --   --  2.2  --   --   HGB 9.2*   < > 9.0*   < > 9.5*   < > 9.5*   < > 10.0*   < > 8.7* 8.5* 8.5* 8.8* 8.5*  HCT 30.6*   < > 30.4*   < > 31.7*   < > 31.9*   < > 33.7*   < > 29.2* 25.0* 28.0* 26.0* 25.0*  MCV 97.1   < > 100.3*   < > 98.4  --  98.8  --  97.7  --  98.3  --  97.2  --   --   PLT 61*   < > 69*   < > 85*  --  86*  --  89*  --  83*  --  84*  --   --    < > = values in this interval not displayed.     Cardiac Enzymes: No results for input(s): CKTOTAL, CKMB, CKMBINDEX, TROPONINI in the last 168 hours.  BNP: BNP (last 3 results) Recent Labs    07/22/19 1039  BNP 15.3    ProBNP (last 3 results) No results for input(s): PROBNP in the last 8760 hours.    Other results:  Imaging: DG Chest 1 View  Result Date: 08/21/2019 CLINICAL DATA:  COVID with respiratory distress EXAM: CHEST  1 VIEW COMPARISON:  Yesterday FINDINGS: Tracheostomy tube in place. Feeding tube which at least reaches the stomach. Bilateral chest tube and large bore IVC cannula. Right PICC with tip at the expected upper cavoatrial junction. Nearly airless bilateral lungs, unchanged. No visible pneumothorax. The heart is obscured. IMPRESSION: 1. Stable hardware positioning 2. Unchanged nearly airless lungs. Electronically Signed   By: Monte Fantasia M.D.   On: 08/21/2019 08:18   DG CHEST PORT 1 VIEW  Result Date: 08/22/2019 CLINICAL DATA:  Respiratory failure. EXAM: PORTABLE CHEST 1 VIEW COMPARISON:  Multiple previous chest x-rays. FINDINGS: Tracheostomy tube is in good position at the mid tracheal level. The feeding tube is coursing down the esophagus and into the stomach. Right PICC line tip is stable in the distal SVC. Bilateral chest tubes are stable.  No pneumothorax. Complete airspace consolidation of both lungs persists with air bronchograms. No obvious pleural effusions. IMPRESSION: 1. Stable support apparatus. 2. Persistent diffuse airspace consolidation in both lungs. Electronically Signed   By: Marijo Sanes M.D.   On: 08/22/2019 12:20   DG Chest Port 1 View  Result Date: 08/22/2019 CLINICAL DATA:  Acute respiratory distress syndrome. EXAM:  PORTABLE CHEST 1 VIEW COMPARISON:  August 21, 2019. FINDINGS: Tracheostomy and feeding tubes are unchanged in position. Bilateral chest tubes are noted without pneumothorax. Stable diffuse opacification of both lungs is noted most consistent with pneumonia. No pneumothorax  is noted. Bony thorax is unremarkable. IMPRESSION: Stable bilateral chest tubes without evidence of pneumothorax. Stable diffuse opacification of both lungs is noted most consistent with pneumonia. Electronically Signed   By: Marijo Conception M.D.   On: 08/22/2019 07:16   DG CHEST PORT 1 VIEW  Result Date: 08/21/2019 CLINICAL DATA:  Hypoxia. EXAM: PORTABLE CHEST 1 VIEW COMPARISON:  Same day. FINDINGS: Tracheostomy and feeding tubes are unchanged in position. Bilateral chest tubes are noted without pneumothorax. Right-sided PICC line is unchanged. Stable diffuse opacification is seen involving both lungs most consistent with pneumonia. Some degree of pleural effusion cannot be excluded. Bony thorax is unremarkable. IMPRESSION: Stable support apparatus. Stable bilateral chest tubes without pneumothorax. Stable diffuse opacification is seen involving both lungs most consistent with pneumonia. Some degree of pleural effusion cannot be excluded. Electronically Signed   By: Marijo Conception M.D.   On: 08/21/2019 12:14   DG Chest Port 1 View  Result Date: 08/20/2019 CLINICAL DATA:  44 year old male positive COVID-19.  ARDS with ECMO. EXAM: PORTABLE CHEST 1 VIEW COMPARISON:  0556 hours today and earlier. FINDINGS: Portable AP semi upright view at 2316 hours. Stable tracheostomy tube. Feeding tube courses to the abdomen, tip not included. Stable bilateral chest tubes. Stable right PICC line. Diffuse bilateral lung opacification with only minimal aeration. Obscured mediastinal contours, diaphragm. No pneumothorax identified. No acute osseous abnormality identified. IMPRESSION: 1.  Stable lines and tubes. 2. Unchanged subtotal lung opacification, minimally increased ventilation since 08/19/2019. Electronically Signed   By: Genevie Ann M.D.   On: 08/20/2019 23:27     Medications:     Scheduled Medications: . artificial tears  1 application Both Eyes H6K  . vitamin C  500 mg Per Tube Daily  . chlorhexidine gluconate  (MEDLINE KIT)  15 mL Mouth Rinse BID  . Chlorhexidine Gluconate Cloth  6 each Topical Daily  . diazepam  20 mg Oral Q6H  . feeding supplement (PRO-STAT SUGAR FREE 64)  60 mL Per Tube BID  . free water  300 mL Per Tube Q8H  . furosemide  20 mg Intravenous Once  . gabapentin  300 mg Per Tube Q8H  . HYDROmorphone  8 mg Per Tube Q4H  . insulin aspart  3-9 Units Subcutaneous Q4H  . mouth rinse  15 mL Mouth Rinse 10 times per day  . oxymetazoline  1 spray Each Nare BID  . pantoprazole sodium  40 mg Per Tube QHS  . QUEtiapine  200 mg Per Tube BID  . sodium chloride flush  10-40 mL Intracatheter Q12H  . sodium chloride flush  10-40 mL Intracatheter Q12H  . Thrombi-Pad  1 each Topical Once  . zinc sulfate  220 mg Per Tube Daily    Infusions: . sodium chloride    . sodium chloride 10 mL/hr at 08/22/19 0700  . albumin human Stopped (08/22/19 0400)  . cefTRIAXone (ROCEPHIN)  IV 1 g (08/22/19 1026)  . cisatracurium (NIMBEX) infusion Stopped (08/21/19 2109)  . dexmedetomidine (PRECEDEX) IV infusion 1.2 mcg/kg/hr (08/22/19 1300)  . dextrose    . feeding supplement (PIVOT 1.5 CAL) 1,000 mL (08/21/19 2119)  . heparin 2,100 Units/hr (08/22/19 0700)  . HYDROmorphone 20 mg/hr (08/22/19 1300)  . midazolam (VERSED) infusion 19 mg/hr (08/22/19  0700)  . norepinephrine (LEVOPHED) Adult infusion Stopped (08/22/19 0445)  . propofol (DIPRIVAN) infusion Stopped (08/21/19 1030)    PRN Medications: Place/Maintain arterial line **AND** sodium chloride, sodium chloride, acetaminophen (TYLENOL) oral liquid 160 mg/5 mL, acetaminophen, albumin human, dextrose, hydrALAZINE, HYDROmorphone, labetalol, magnesium hydroxide, metoprolol tartrate, midazolam, ondansetron (ZOFRAN) IV, senna-docusate, sodium chloride, sodium chloride flush, sodium chloride flush, sodium chloride flush   Assessment/Plan:    1. Acute hypoxic/hypercarbic respiratory failure due to COVID PNA: Remained markedly hypoxic despite full support.  Intubated 12/26. S/p bilateral CTs 12/26 for pneumothoraces.  TEE on 12/26 LVEF 70% RV ok. VV ECMO begun 12/26. Day #9 VV ECMO. Tracheostomy 12/29.  COVID ARDS at this point.  Flows stable and sats improved with deeper sedation and re-paralysis. Ecmo circuit changed 1/8 - ECMO parameters stable.  - Continues with severe hypoxemia. Post oxygenator gas and color change looks good so oxygenator working well. SVO2 low so now significant recirc. Flows good.  - Cannula position reviewed on CXR. Stable - Main issue is bad lungs. Continue manuevers to improve lung compliance - daily bronch with removal of clots, diuresis and standing position - If sats persistently < 80% may need to add an arterial cannula but as long as organ perfusion and lactate stable will continue current management and await lung recovery.  - Continues to have epistaxis.With lung clots. Continue bronch daily. CCM and ENT following - Continue lung rest.  - LDH 329 - Lactate 1.0  2. Bilateral PTX: Due to barotrauma.  - Driving pressure minimized at 12-13. Stable. No change  3. COVID PNA: management of COVID infection per CCM. CXR with bilateral multifocal PNA progressing to likely ARDS.  - s/p 10 days of remdesivir - 12/16, 12/2 s/p tociluzimab - 12/17 s/p convalescent plasma - Decadron at 21m/daily completed 1/11 - Completed cefepime/vanc - On ceftriaxone for nasal packing  4. ID: Empiric coverage with vancomycin/cefepime initially for possible secondary bacterial PNA, course completed.   - treated with cefepime and vanc for + cx with staph epidimidis, MSSA and serratia (sensitivities ok). - Cover with ceftriaxone for nasal packing - Remains stable - D/w PharmD  5. Thrombocytopenia: Slow decrease in platelets, likely low level hemolysis + critical illness.  - 84k ths am.  - Back on heparin. Heparin dosing d/w PharmD  6. Anemia: Bleeding at trach site resolved but still with epistaxis - hgb 10.2 -> 9.2 -> 9.0 -> 9.5  ->9.2 -> 8.5 -> 7.9  - Transfuse hgb < 8 I gave 1u this am   7. FEN - continue TFs  D/w with ECMO team (ECMO coordinator/specialists, CCM, TCTS and pharmD) at bedside on multidisciplinary rounds. Continue to await ARDS recovery. Details as above.   Length of Stay: 31   CRITICAL CARE Performed by: DGlori Bickers Total critical care time: 40 minutes  Critical care time was exclusive of separately billable procedures and treating other patients.  Critical care was necessary to treat or prevent imminent or life-threatening deterioration.  Critical care was time spent personally by me on the following activities: development of treatment plan with patient and/or surrogate as well as nursing, discussions with consultants, evaluation of patient's response to treatment, examination of patient, obtaining history from patient or surrogate, ordering and performing treatments and interventions, ordering and review of laboratory studies, ordering and review of radiographic studies, pulse oximetry and re-evaluation of patient's condition.    DGlori BickersMD 08/22/2019, 1:06 PM  Advanced Heart Failure Team Pager [phone](M-F; 7a - 4p)  Please contact [facility] for night-coverage after hours (4p -7a ) and weekends on [email or web address]

## 2019-08-22 NOTE — Progress Notes (Signed)
ANTICOAGULATION CONSULT NOTE  Pharmacy Consult for heparin Indication: ECMO  No Known Allergies  Patient Measurements: Height: 5' 8"  (172.7 cm) Weight: 289 lb 7.4 oz (131.3 kg) IBW/kg (Calculated) : 68.4 Heparin Dosing Weight: 100.7 kg  Vital Signs: Temp: 98.8 F (37.1 C) (01/14 0000) Temp Source: Oral (01/14 0000) BP: 112/46 (01/13 2204) Pulse Rate: 77 (01/14 0300)  Labs: Recent Labs    08/20/19 0444 08/20/19 0445 08/20/19 1655 08/20/19 2015 08/21/19 0203 08/21/19 0510 08/21/19 1621 08/21/19 1621 08/21/19 2033 08/22/19 0336 08/22/19 0347  HGB 9.5*   < > 9.5*   < > 10.0*   < > 8.7*   < > 8.5*  --  8.8*  HCT 31.7*   < > 31.9*   < > 33.7*   < > 29.2*  --  25.0*  --  26.0*  PLT 85*   < > 86*  --  89*  --  83*  --   --   --   --   APTT  --    < >  --   --  75*  --  108*  --   --  121*  --   LABPROT 15.1  --   --   --  14.4  --   --   --   --  14.8  --   INR 1.2  --   --   --  1.1  --   --   --   --  1.2  --   HEPARINUNFRC  --    < > 0.47   < > 0.32  --  0.41  --   --  0.33  --   CREATININE 0.61   < > 0.41*   < > 0.37*  --  0.43*  --   --  0.44*  --    < > = values in this interval not displayed.    Estimated Creatinine Clearance: 154.4 mL/min (A) (by C-G formula based on SCr of 0.44 mg/dL (L)).  Assessment: 44 yo male known COVID+ and ARDS VV ECMO. Heparin was restarted 1/5 (bivalirudin stopped as HIT test was negative).  Heparin level therapeutic this evening at 0.41.  APTT slightly high, but heparin level appears to be the more accurate lab. H/H and pltc stable.  1/14 AM update:  Heparin level remains within therapeutic range Hgb low but stable   Goal of Therapy:  Heparin level: 0.3-0.5 APTT goal: 66-85 sec Monitor platelets by anticoagulation protocol: Yes   Plan:  -Continue IV heparin at current rate. -Check heparin level and aPTT q12h -Monitor s/sx of bleeding   Narda Bonds, PharmD, BCPS Clinical Pharmacist Phone: [phone]

## 2019-08-22 NOTE — Progress Notes (Signed)
PT Cancellation Note  Patient Details Name: [name] MRN: [id number] DOB: 1976/05/17   Cancelled Treatment:    Reason Eval/Treat Not Completed: Other (comment). Plan for patient to be transferred to [facility] tilting bed tomorrow s/p OR. PT will follow up to initiate PT services once in new bed.   Zenaida Niece 08/22/2019, 10:58 AM

## 2019-08-22 NOTE — Procedures (Signed)
Bronchoscopy  Indication:Mucus plugging  Consent: Signed in chart  Anesthesia: In place for ECMO  Procedure - Timeout performed - Bronchoscope advanced through ETT - Findings as below  Findings - Complete occlusion of right mainstem bronchi with blood clots, successfully suctioned; less plugs in left mainstem - Underlying mucosa looks mildly irritated - Good trach position  Specimen(s): none  Complications: none immediate  Blood also suctioned from oropharynx.  Will ask ENT to use longer/larger packing to try to stop what is likely a very posterior nose bleed dripping through trach, page sent out at [place], informed they are in surgery until this afternoon.

## 2019-08-22 NOTE — Progress Notes (Signed)
   Subjective:    Patient ID: [name], male    DOB: 1976/06/29, 44 y.o.   MRN: [id number]  HPI Asked to reassess patient due to nose bleeding and clot plugging the bronchi.  Packs that I had placed 1/7 had been removed.  His nose was repacked two days ago with Rhino rocket packs but has bled in spite of the packs.  Internal medicine staff repacked his nose just prior to my arrival using two 10 cm Merocel packs with good control of bleeding.  Review of Systems     Objective:   Physical Exam Sedated on ECMO Bilateral packs in place, well-positioned, no bleeding    Assessment & Plan:  Epistaxis, anticoagulation  The nose was repacked perfectly by Internal Medicine and bleeding is controlled.  I agree with removing packs every five days and leave packs out if he does not bleed.  If repacking is necessary, place two 10 cm Merocel packs fully within the nasal passages.

## 2019-08-22 NOTE — Progress Notes (Signed)
ANTICOAGULATION CONSULT NOTE  Pharmacy Consult for heparin Indication: ECMO  No Known Allergies  Patient Measurements: Height: 5' 8"  (172.7 cm) Weight: 293 lb 14 oz (133.3 kg) IBW/kg (Calculated) : 68.4 Heparin Dosing Weight: 100.7 kg  Vital Signs: Temp: 97.9 F (36.6 C) (01/14 1504) Temp Source: Core (01/14 1504) Pulse Rate: 80 (01/14 1524)  Labs: Recent Labs    08/20/19 0444 08/20/19 0445 08/21/19 0203 08/21/19 0510 08/21/19 1621 08/21/19 2033 08/22/19 0336 08/22/19 0347 08/22/19 1217 08/22/19 1217 08/22/19 1324 08/22/19 1736  HGB 9.5*   < > 10.0*   < > 8.7*   < > 8.5*   < > 8.2*   < > 9.2* 8.1*  HCT 31.7*   < > 33.7*   < > 29.2*   < > 28.0*   < > 24.0*  --  27.0* 27.3*  PLT 85*   < > 89*   < > 83*  --  84*  --   --   --   --  90*  APTT  --    < > 75*  --  108*  --  121*  --   --   --   --   --   LABPROT 15.1  --  14.4  --   --   --  14.8  --   --   --   --   --   INR 1.2  --  1.1  --   --   --  1.2  --   --   --   --   --   HEPARINUNFRC  --    < > 0.32   < > 0.41  --  0.33  --   --   --   --  0.28*  CREATININE 0.61   < > 0.37*   < > 0.43*  --  0.44*  --   --   --   --  0.48*   < > = values in this interval not displayed.    Estimated Creatinine Clearance: 155.7 mL/min (A) (by C-G formula based on SCr of 0.48 mg/dL (L)).  Assessment: 44 yo male known COVID+ and ARDS VV ECMO. Heparin was restarted 1/5 (bivalirudin stopped as HIT test was negative).  Heparin level subtherapeutic this evening at 0.28.  APTT high, but heparin level appears to be the more accurate lab. H/H and pltc stable. Posterior epistaxis this admit controlled with packing. No change tonight per discussion with RN.   Goal of Therapy:  Heparin level: 0.3-0.5 APTT goal: 66-85 sec Monitor platelets by anticoagulation protocol: Yes   Plan:  -Increase IV heparin to 2200 units/hr -Check heparin level and aPTT in 6 hours -Monitor CBC, s/sx of bleeding   Elicia Lamp, PharmD, BCPS Please check  AMION for all [place] contact numbers Clinical Pharmacist 08/22/2019 6:59 PM

## 2019-08-22 NOTE — Progress Notes (Signed)
Called to bedside for desats to 60s. No change in flows or pressures No change in color of drain and return cannula blood  A couple things were tried:  (1) Increased flows, only tolerated from 5.5 to 5.8LPM due to venous pressure issues, this made no difference  (2) Increased FiO2 on vent: this made no difference  (3) Re-paralyzed patient, this made no difference  (4) Beta blocker to reduce native cardiac output and attempt to increase fractional ECMO circuit flow: no difference  (5) placing patient in reverse trendelenberg: saturation immediately increased 15% to high 80s.  On further testing laying patient supine not only dropped peripheral saturations to 60s/70s but also increased return SvO2 indicating worsened recirculation and/or higher fractional amount of IVC return bypassing ECMO circuit.  Will leave in reverse trendelenberg.  Long-term, need to move return catheter closer to RA or place an additional return SVC catheter.  Additional 50 minutes critical care time spent figuring out above.  Erskine Emery MD PCCM

## 2019-08-22 NOTE — Progress Notes (Signed)
Called and left VM for wife.  Erskine Emery MD

## 2019-08-23 ENCOUNTER — Inpatient Hospital Stay (HOSPITAL_COMMUNITY): Payer: Managed Care, Other (non HMO)

## 2019-08-23 LAB — POCT I-STAT 7, (LYTES, BLD GAS, ICA,H+H)
Acid-Base Excess: 2 mmol/L (ref 0.0–2.0)
Acid-Base Excess: 2 mmol/L (ref 0.0–2.0)
Acid-base deficit: 1 mmol/L (ref 0.0–2.0)
Bicarbonate: 27.5 mmol/L (ref 20.0–28.0)
Bicarbonate: 25.2 mmol/L (ref 20.0–28.0)
Bicarbonate: 25.2 mmol/L (ref 20.0–28.0)
Bicarbonate: 26.8 mmol/L (ref 20.0–28.0)
Calcium, Ion: 1.34 mmol/L (ref 1.15–1.40)
Calcium, Ion: 1.33 mmol/L (ref 1.15–1.40)
Calcium, Ion: 1.32 mmol/L (ref 1.15–1.40)
Calcium, Ion: 1.35 mmol/L (ref 1.15–1.40)
HCT: 24 % — ABNORMAL LOW (ref 39.0–52.0)
HCT: 26 % — ABNORMAL LOW (ref 39.0–52.0)
HCT: 26 % — ABNORMAL LOW (ref 39.0–52.0)
HCT: 23 % — ABNORMAL LOW (ref 39.0–52.0)
Hemoglobin: 8.2 g/dL — ABNORMAL LOW (ref 13.0–17.0)
Hemoglobin: 8.8 g/dL — ABNORMAL LOW (ref 13.0–17.0)
Hemoglobin: 8.8 g/dL — ABNORMAL LOW (ref 13.0–17.0)
Hemoglobin: 7.8 g/dL — ABNORMAL LOW (ref 13.0–17.0)
O2 Saturation: 86 %
O2 Saturation: 90 %
O2 Saturation: 89 %
O2 Saturation: 94 %
Patient temperature: 98.1
Patient temperature: 98
Patient temperature: 36.4
Patient temperature: 36.5
Potassium: 4.4 mmol/L (ref 3.5–5.1)
Potassium: 4 mmol/L (ref 3.5–5.1)
Potassium: 4.2 mmol/L (ref 3.5–5.1)
Potassium: 4.1 mmol/L (ref 3.5–5.1)
Sodium: 144 mmol/L (ref 135–145)
Sodium: 145 mmol/L (ref 135–145)
Sodium: 145 mmol/L (ref 135–145)
Sodium: 144 mmol/L (ref 135–145)
TCO2: 29 mmol/L (ref 22–32)
TCO2: 27 mmol/L (ref 22–32)
TCO2: 26 mmol/L (ref 22–32)
TCO2: 28 mmol/L (ref 22–32)
pCO2 arterial: 44.1 mmHg (ref 32.0–48.0)
pCO2 arterial: 48.8 mmHg — ABNORMAL HIGH (ref 32.0–48.0)
pCO2 arterial: 43 mmHg (ref 32.0–48.0)
pCO2 arterial: 43.5 mmHg (ref 32.0–48.0)
pH, Arterial: 7.401 (ref 7.350–7.450)
pH, Arterial: 7.319 — ABNORMAL LOW (ref 7.350–7.450)
pH, Arterial: 7.373 (ref 7.350–7.450)
pH, Arterial: 7.395 (ref 7.350–7.450)
pO2, Arterial: 52 mmHg — ABNORMAL LOW (ref 83.0–108.0)
pO2, Arterial: 63 mmHg — ABNORMAL LOW (ref 83.0–108.0)
pO2, Arterial: 56 mmHg — ABNORMAL LOW (ref 83.0–108.0)
pO2, Arterial: 71 mmHg — ABNORMAL LOW (ref 83.0–108.0)

## 2019-08-23 LAB — CBC
HCT: 28.6 % — ABNORMAL LOW (ref 39.0–52.0)
HCT: 27.7 % — ABNORMAL LOW (ref 39.0–52.0)
HCT: 26.3 % — ABNORMAL LOW (ref 39.0–52.0)
Hemoglobin: 8.3 g/dL — ABNORMAL LOW (ref 13.0–17.0)
Hemoglobin: 8 g/dL — ABNORMAL LOW (ref 13.0–17.0)
Hemoglobin: 7.9 g/dL — ABNORMAL LOW (ref 13.0–17.0)
MCH: 28.8 pg (ref 26.0–34.0)
MCH: 28.2 pg (ref 26.0–34.0)
MCH: 28.9 pg (ref 26.0–34.0)
MCHC: 29 g/dL — ABNORMAL LOW (ref 30.0–36.0)
MCHC: 28.9 g/dL — ABNORMAL LOW (ref 30.0–36.0)
MCHC: 30 g/dL (ref 30.0–36.0)
MCV: 99.3 fL (ref 80.0–100.0)
MCV: 97.5 fL (ref 80.0–100.0)
MCV: 96.3 fL (ref 80.0–100.0)
Platelets: 102 10*3/uL — ABNORMAL LOW (ref 150–400)
Platelets: 96 10*3/uL — ABNORMAL LOW (ref 150–400)
Platelets: 95 10*3/uL — ABNORMAL LOW (ref 150–400)
RBC: 2.88 MIL/uL — ABNORMAL LOW (ref 4.22–5.81)
RBC: 2.84 MIL/uL — ABNORMAL LOW (ref 4.22–5.81)
RBC: 2.73 MIL/uL — ABNORMAL LOW (ref 4.22–5.81)
RDW: 18.1 % — ABNORMAL HIGH (ref 11.5–15.5)
RDW: 17.9 % — ABNORMAL HIGH (ref 11.5–15.5)
RDW: 17.9 % — ABNORMAL HIGH (ref 11.5–15.5)
WBC: 5.4 10*3/uL (ref 4.0–10.5)
WBC: 5.3 10*3/uL (ref 4.0–10.5)
WBC: 4.8 10*3/uL (ref 4.0–10.5)
nRBC: 1.3 % — ABNORMAL HIGH (ref 0.0–0.2)
nRBC: 1 % — ABNORMAL HIGH (ref 0.0–0.2)
nRBC: 1 % — ABNORMAL HIGH (ref 0.0–0.2)

## 2019-08-23 LAB — CBC WITH DIFFERENTIAL/PLATELET
Abs Immature Granulocytes: 0.56 10*3/uL — ABNORMAL HIGH (ref 0.00–0.07)
Basophils Absolute: 0 10*3/uL (ref 0.0–0.1)
Basophils Relative: 0 %
Eosinophils Absolute: 0.2 10*3/uL (ref 0.0–0.5)
Eosinophils Relative: 5 %
HCT: 26.8 % — ABNORMAL LOW (ref 39.0–52.0)
Hemoglobin: 7.9 g/dL — ABNORMAL LOW (ref 13.0–17.0)
Immature Granulocytes: 12 %
Lymphocytes Relative: 23 %
Lymphs Abs: 1.1 10*3/uL (ref 0.7–4.0)
MCH: 29 pg (ref 26.0–34.0)
MCHC: 29.5 g/dL — ABNORMAL LOW (ref 30.0–36.0)
MCV: 98.5 fL (ref 80.0–100.0)
Monocytes Absolute: 0.7 10*3/uL (ref 0.1–1.0)
Monocytes Relative: 14 %
Neutro Abs: 2.2 10*3/uL (ref 1.7–7.7)
Neutrophils Relative %: 46 %
Platelets: 91 10*3/uL — ABNORMAL LOW (ref 150–400)
RBC: 2.72 MIL/uL — ABNORMAL LOW (ref 4.22–5.81)
RDW: 18.3 % — ABNORMAL HIGH (ref 11.5–15.5)
WBC: 4.7 10*3/uL (ref 4.0–10.5)
nRBC: 0.6 % — ABNORMAL HIGH (ref 0.0–0.2)

## 2019-08-23 LAB — CULTURE, RESPIRATORY W GRAM STAIN

## 2019-08-23 LAB — HEPARIN LEVEL (UNFRACTIONATED)
Heparin Unfractionated: 0.28 IU/mL — ABNORMAL LOW (ref 0.30–0.70)
Heparin Unfractionated: 0.34 IU/mL (ref 0.30–0.70)
Heparin Unfractionated: 0.36 IU/mL (ref 0.30–0.70)

## 2019-08-23 LAB — LACTATE DEHYDROGENASE: LDH: 329 U/L — ABNORMAL HIGH (ref 98–192)

## 2019-08-23 LAB — PROTIME-INR
INR: 1.3 — ABNORMAL HIGH (ref 0.8–1.2)
Prothrombin Time: 16.2 seconds — ABNORMAL HIGH (ref 11.4–15.2)

## 2019-08-23 LAB — COOXEMETRY PANEL
Carboxyhemoglobin: 3.5 % — ABNORMAL HIGH (ref 0.5–1.5)
Methemoglobin: 0.8 % (ref 0.0–1.5)
O2 Saturation: 68.6 %
Total hemoglobin: 9.4 g/dL — ABNORMAL LOW (ref 12.0–16.0)

## 2019-08-23 LAB — HEPATIC FUNCTION PANEL
ALT: 23 U/L (ref 0–44)
AST: 18 U/L (ref 15–41)
Albumin: 3 g/dL — ABNORMAL LOW (ref 3.5–5.0)
Alkaline Phosphatase: 52 U/L (ref 38–126)
Bilirubin, Direct: 0.1 mg/dL (ref 0.0–0.2)
Indirect Bilirubin: 0.7 mg/dL (ref 0.3–0.9)
Total Bilirubin: 0.8 mg/dL (ref 0.3–1.2)
Total Protein: 6.1 g/dL — ABNORMAL LOW (ref 6.5–8.1)

## 2019-08-23 LAB — FIBRINOGEN: Fibrinogen: 666 mg/dL — ABNORMAL HIGH (ref 210–475)

## 2019-08-23 LAB — BASIC METABOLIC PANEL
Anion gap: 8 (ref 5–15)
Anion gap: 9 (ref 5–15)
BUN: 45 mg/dL — ABNORMAL HIGH (ref 6–20)
BUN: 42 mg/dL — ABNORMAL HIGH (ref 6–20)
CO2: 25 mmol/L (ref 22–32)
CO2: 25 mmol/L (ref 22–32)
Calcium: 9.1 mg/dL (ref 8.9–10.3)
Calcium: 9.2 mg/dL (ref 8.9–10.3)
Chloride: 109 mmol/L (ref 98–111)
Chloride: 111 mmol/L (ref 98–111)
Creatinine, Ser: 0.41 mg/dL — ABNORMAL LOW (ref 0.61–1.24)
Creatinine, Ser: 0.56 mg/dL — ABNORMAL LOW (ref 0.61–1.24)
GFR calc Af Amer: >60 mL/min (ref >60)
GFR calc Af Amer: >60 mL/min (ref >60)
GFR calc non Af Amer: >60 mL/min (ref >60)
GFR calc non Af Amer: >60 mL/min (ref >60)
Glucose, Bld: 152 mg/dL — ABNORMAL HIGH (ref 70–99)
Glucose, Bld: 192 mg/dL — ABNORMAL HIGH (ref 70–99)
Potassium: 4.4 mmol/L (ref 3.5–5.1)
Potassium: 4.4 mmol/L (ref 3.5–5.1)
Sodium: 142 mmol/L (ref 135–145)
Sodium: 145 mmol/L (ref 135–145)

## 2019-08-23 LAB — GLUCOSE, CAPILLARY
Glucose-Capillary: 145 mg/dL — ABNORMAL HIGH (ref 70–99)
Glucose-Capillary: 150 mg/dL — ABNORMAL HIGH (ref 70–99)
Glucose-Capillary: 150 mg/dL — ABNORMAL HIGH (ref 70–99)
Glucose-Capillary: 177 mg/dL — ABNORMAL HIGH (ref 70–99)
Glucose-Capillary: 158 mg/dL — ABNORMAL HIGH (ref 70–99)

## 2019-08-23 LAB — PREPARE RBC (CROSSMATCH)

## 2019-08-23 LAB — LACTIC ACID, PLASMA: Lactic Acid, Venous: 1 mmol/L (ref 0.5–1.9)

## 2019-08-23 LAB — APTT: aPTT: 83 seconds — ABNORMAL HIGH (ref 24–36)

## 2019-08-23 MED ORDER — GABAPENTIN 250 MG/5ML PO SOLN
450.0000 mg | Freq: Three times a day (TID) | ORAL | Status: DC
  Administered 2019-08-23 – 2019-09-08 (×45): 450 mg
  Filled 2019-08-23 (×53): qty 12

## 2019-08-23 MED ORDER — SODIUM CHLORIDE 0.9% IV SOLUTION: Freq: Once | INTRAVENOUS | Status: AC

## 2019-08-23 MED ORDER — ROCURONIUM BROMIDE 50 MG/5ML IV SOLN
100.0000 mg | Freq: Once | INTRAVENOUS | Status: DC
  Filled 2019-08-23 (×2): qty 10

## 2019-08-23 MED ORDER — ROCURONIUM BROMIDE 50 MG/5ML IV SOLN
80.0000 mg | Freq: Once | INTRAVENOUS | Status: AC
  Administered 2019-08-23: 08:00:00 80 mg via INTRAVENOUS
  Filled 2019-08-23: qty 8

## 2019-08-23 MED ORDER — SODIUM CHLORIDE 0.9 % IV SOLN
1.0000 mg/h | INTRAVENOUS | Status: DC
  Administered 2019-08-23: 10 mg/h via INTRAVENOUS
  Administered 2019-08-25: 12:00:00 30 mg/h via INTRAVENOUS
  Administered 2019-08-25: 03:00:00 20 mg/h via INTRAVENOUS
  Administered 2019-08-25 – 2019-08-26 (×2): 30 mg/h via INTRAVENOUS
  Administered 2019-08-26 (×2): 35 mg/h via INTRAVENOUS
  Administered 2019-08-27: 05:00:00 37 mg/h via INTRAVENOUS
  Administered 2019-08-27: 19:00:00 35 mg/h via INTRAVENOUS
  Administered 2019-08-27: 12:00:00 40 mg/h via INTRAVENOUS
  Administered 2019-08-28: 09:00:00 37 mg/h via INTRAVENOUS
  Administered 2019-08-28: 40 mg/h via INTRAVENOUS
  Administered 2019-08-28 – 2019-08-30 (×6): 38 mg/h via INTRAVENOUS
  Administered 2019-08-30: 40 mg/h via INTRAVENOUS
  Administered 2019-08-30: 38 mg/h via INTRAVENOUS
  Administered 2019-08-31: 06:00:00 40 mg/h via INTRAVENOUS
  Administered 2019-08-31: 25 mg/h via INTRAVENOUS
  Administered 2019-09-01 – 2019-09-02 (×2): 10 mg/h via INTRAVENOUS
  Administered 2019-09-03: 17:00:00 9 mg/h via INTRAVENOUS
  Filled 2019-08-23 (×17): qty 50
  Filled 2019-08-23: qty 100
  Filled 2019-08-23 (×7): qty 50
  Filled 2019-08-23: qty 100
  Filled 2019-08-23 (×4): qty 50
  Filled 2019-08-23: qty 100
  Filled 2019-08-23 (×5): qty 50

## 2019-08-23 MED ORDER — SODIUM CHLORIDE 0.9 % IV SOLN
2.0000 g | INTRAVENOUS | Status: DC
  Administered 2019-08-23 – 2019-09-04 (×13): 2 g via INTRAVENOUS
  Filled 2019-08-23: qty 20
  Filled 2019-08-23 (×3): qty 2
  Filled 2019-08-23: qty 20
  Filled 2019-08-23 (×9): qty 2

## 2019-08-23 MED ORDER — ROCURONIUM BROMIDE 50 MG/5ML IV SOLN
80.0000 mg | Freq: Once | INTRAVENOUS | Status: AC
  Administered 2019-08-23: 09:00:00 80 mg via INTRAVENOUS
  Filled 2019-08-23: qty 8

## 2019-08-23 MED ORDER — ROCURONIUM BROMIDE 100 MG/10ML IV SOLN
100.0000 mg | Freq: Once | INTRAVENOUS | Status: AC
  Administered 2019-08-23: 12:00:00 100 mg via INTRAVENOUS
  Filled 2019-08-23: qty 10

## 2019-08-23 MED ORDER — FUROSEMIDE 10 MG/ML IJ SOLN
20.0000 mg | Freq: Once | INTRAMUSCULAR | Status: AC
  Administered 2019-08-23: 09:00:00 20 mg via INTRAVENOUS
  Filled 2019-08-23: qty 2

## 2019-08-23 NOTE — Progress Notes (Signed)
NAME:  [name], MRN:  [id number], DOB:  12/27/1975, LOS: 23 ADMISSION DATE:  07/22/2019, CONSULTATION DATE:  12/24 REFERRING MD:  Sloan Leiter, CHIEF COMPLAINT:  Dyspnea   Brief History   44 y/o M admitted 12/14 with COVID pneumonia causing acute hypoxemic respiratory failure. Developed pneumomediastinum 12/23 with concern for possible bilateral small pneumothoraces on 12/24.  PCCM consulted for evaluation of pneumothoraces  Past Medical History  GERD HTN Asthma  Significant Hospital Events   12/14 admit with hypoxemic respiratory failure in setting of COVID-19 pneumonia 12/23 noted to have pneumomediastinum on chest x-ray 12/24 PCCM consulted for evaluation 12/26 worsening hypoxemia, intubated  12/27 VV fem/fem ECMO cannulation See bedside nursing event notes for full rundown of procedures after 12/27   Consults:  PCCM, CTS, CHF, Palliative  Procedures:  See bedside nursing event notes for full rundown of procedures.  Significant Diagnostic Tests:  CT chest 12/22 >> extensive pneumomediastinum, multifocal patchy bilateral groundglass opacities  Micro Data:  BCx2 12/14 >> negative  Sputum 12/15 >> negative  1/9 bronch: serratia and MSSA 1/2 acid fast smear bronch: negative 1/2 fungal cx bronch: pending 1/12 BAL: serratia  Antimicrobials:  Received remdesivir, steroids, actemra and convalescent plasma  5 days cefepime 12/24-12/29 Cefepime 1/4->1/14 vanc 1/4->1/14 Ceftriaxone 1/14 >>   Interim history/subjective:  Still tenuous sats.  Lactate, kidney function stable.  Ongoing posterior nasal bleeding requiring suctioning from mouth.  Objective   Blood pressure (!) 112/46, pulse 79, temperature 98.8 F (37.1 C), temperature source Oral, resp. rate 15, height _0  (1.727 m), weight 133.3 kg, SpO2 (!) 80 %.    Vent Mode: PCV FiO2 (%):  [50 %-100 %] 60 % Set Rate:  [20 bmp] 20 bmp PEEP:  [12 cmH20] 12 cmH20 Plateau Pressure:  [19 cmH20-27 cmH20] 24 cmH20    Intake/Output Summary (Last 24 hours) at 08/23/2019 0754 Last data filed at 08/23/2019 0700 Gross per 24 hour  Intake 5446.94 ml  Output 3200 ml  Net 2246.94 ml   Filed Weights   08/20/19 0500 08/21/19 0500 08/22/19 0500  Weight: 129.9 kg 131.3 kg 133.3 kg   Examination GEN: obese ill man on vent HEENT: trach in place, small bloody sedations, nasal packing in place bilaterally, oozing now in mouth CV: RRR, ext warm PULM: severely diminished but present bilaterally GI: Soft, + BS EXT: Global anascarca NEURO: sedated, no withdrawing PSYCH: RASS -4 SKIN: pale Femoral cannulas in place, sites look okay Bilateral chest tubes in place, no tidaling or air leak, ongoing serosanguinous output Foley in place dark yellow urine  CXR actually looks a little better to me- RLL down but other lobes have air bronchograms CMP looks good Sugars look good LDH stable in 300s ABG with stable pO2 at 52 Heparin 0.28 (goal 0.3-0.5) Hgb slightly decreased (1 unit transfusing), plts improved Lactate 1, stable  Resolved Hospital Problem list    Assessment & Plan:    Acute hypoxemic respiratory failure with ARDS secondary to COVID-19, s/p tracheostomy requiring mechanical ventilation and VV ECMO support.  COVID has been treated with all evidence based therapies, we are dealing with lingering severe diffuse alveolar damage.  -Continue ECMO at current flow rate.  -Bronchoscopy for secretion/clot clearance as needed -Continue to follow Vt on PC. Keep Driving pressure 15 of less. -Intermittent diuresis, will discuss with CHF team -Continues to desaturate and chug with minimal stimulation.  Continue current level enteral sedation. -Versed/precedex/dilaudid gtts with orals as written, keep heavily sedated until TV improve, gabapentin increased  today, trying to avoid paralytic drips -Premedicate with Dilaudid prior to turns. -Heparin with 0.3-0.5 goal  -Ongoing hypoxemia is an issue but we don't have  any evidence of end organ damage, see discussions yesterday, only thing I can think to do is place single catheter VV cannula with directed flow over right atrium or place a third return cannula targeting SVC/atrium junction  MSSA and serratia marcescens pneumonia:  cefepime and vancomycin completed for 10 days Still has serratia on BAL but no signs of infection Ceftriaxone thereafter with duration TBD for TSS ppx while nasal packing in place  Bilateral pneumothoraces and pneumomediastinum No leak today Continue suction  -20cmH2O  Posterior epistaxis.  Repacked multiple times, last one ineffective Repack today, ongoing oozing in posterior pharynx   Daily Goals Checklist  Pain/Anxiety/Delirium protocol (if indicated): see discussion above VAP protocol (if indicated): Bundle in place. Respiratory support goals: Continue PCV with ultra-lung protective ventilation  Blood pressure target:  MAP>65  DVT prophylaxis: on systemic anticoagulation on VV-ECMO -  Nutrition Status: Nutrition Problem: Increased nutrient needs Etiology: acute illness(COVID PNA) Signs/Symptoms: estimated needs Interventions: Tube feeding, Prostat GI prophylaxis: Protonix per tube. Fluid status goals: even if allowed by pven Urinary catheter: Guide hemodynamic management Glucose control: euglycemic on SSI. Mobility/therapy needs: Bedrest.  Daily labs: as per ECMO protocol. Code Status: Full code  Family Communication: will try to reach out today Disposition: ICU   Labs   CBC: Recent Labs  Lab 08/19/19 0439 08/19/19 0447 08/20/19 0444 08/20/19 0834 08/21/19 0203 08/21/19 0510 08/21/19 1621 08/21/19 2033 08/22/19 0336 08/22/19 0347 08/22/19 1736 08/22/19 2055 08/22/19 2140 08/23/19 0335 08/23/19 0346  WBC 7.0   < > 5.6   < > 7.8   < > 5.3  --  5.0  --  5.4  --  5.2 4.7  --   NEUTROABS 2.8  --  1.9  --  4.9  --   --   --  2.2  --   --   --   --  2.2  --   HGB 9.0*   < > 9.5*   < > 10.0*   < >  8.7*   < > 8.5*   < > 8.1* 7.8* 8.2* 7.9* 8.2*  HCT 30.4*   < > 31.7*   < > 33.7*   < > 29.2*   < > 28.0*   < > 27.3* 23.0* 27.7* 26.8* 24.0*  MCV 100.3*   < > 98.4   < > 97.7   < > 98.3  --  97.2  --  97.8  --  96.9 98.5  --   PLT 69*   < > 85*   < > 89*   < > 83*  --  84*  --  90*  --  83* 91*  --    < > = values in this interval not displayed.    Basic Metabolic Panel: Recent Labs  Lab 08/21/19 0203 08/21/19 0510 08/21/19 1621 08/21/19 2033 08/22/19 0336 08/22/19 0347 08/22/19 1324 08/22/19 1736 08/22/19 2055 08/23/19 0335 08/23/19 0346  NA 143   < > 141   < > 144   < > 145 144 146* 142 144  K 4.1   < > 4.2   < > 4.1   < > 3.9 4.3 3.8 4.4 4.4  CL 110  --  107  --  110  --   --  111  --  109  --   CO2 25  --  26  --  25  --   --  26  --  25  --   GLUCOSE 160*  --  153*  --  160*  --   --  126*  --  152*  --   BUN 36*  --  37*  --  37*  --   --  40*  --  45*  --   CREATININE 0.37*  --  0.43*  --  0.44*  --   --  0.48*  --  0.41*  --   CALCIUM 8.7*  --  9.0  --  8.9  --   --  9.0  --  9.1  --    < > = values in this interval not displayed.   GFR: Estimated Creatinine Clearance: 155.7 mL/min (A) (by C-G formula based on SCr of 0.41 mg/dL (L)). Recent Labs  Lab 08/20/19 0457 08/20/19 1655 08/21/19 0203 08/21/19 1621 08/22/19 0336 08/22/19 1736 08/22/19 2140 08/23/19 0335  WBC  --    < > 7.8   < > 5.0 5.4 5.2 4.7  LATICACIDVEN 1.2  --  1.2  --  1.0  --   --  1.0   < > = values in this interval not displayed.    Liver Function Tests: Recent Labs  Lab 08/19/19 0439 08/20/19 0444 08/21/19 0203 08/22/19 0336 08/23/19 0335  AST 14* 13* _0 ALT _1 ALKPHOS 48 46 50 46 52  BILITOT 0.7 0.7 0.8 0.8 0.8  PROT 5.4* 5.6* 5.9* 6.0* 6.1*  ALBUMIN 2.7* 2.6* 2.8* 3.2* 3.0*   No results for input(s): LIPASE, AMYLASE in the last 168 hours. No results for input(s): AMMONIA in the last 168 hours.  ABG    Component Value Date/Time   PHART 7.401  08/23/2019 0346   PCO2ART 44.1 08/23/2019 0346   PO2ART 52.0 (L) 08/23/2019 0346   HCO3 27.5 08/23/2019 0346   TCO2 29 08/23/2019 0346   ACIDBASEDEF 3.0 (H) 08/22/2019 2055   O2SAT 68.6 08/23/2019 0350     Coagulation Profile: Recent Labs  Lab 08/18/19 0414 08/19/19 0439 08/20/19 0444 08/21/19 0203 08/22/19 0336  INR 1.1 1.2 1.2 1.1 1.2    Cardiac Enzymes: No results for input(s): CKTOTAL, CKMB, CKMBINDEX, TROPONINI in the last 168 hours.  HbA1C: Hgb A1c MFr Bld  Date/Time Value Ref Range Status  07/24/2019 04:50 AM 6.7 (H) 4.8 - 5.6 % Final    Comment:    (NOTE) Pre diabetes:          5.7%-6.4% Diabetes:              >6.4% Glycemic control for   <7.0% adults with diabetes   09/30/2017 05:36 AM 5.7 (H) 4.8 - 5.6 % Final    Comment:    (NOTE)         Prediabetes: 5.7 - 6.4         Diabetes: >6.4         Glycemic control for adults with diabetes: <7.0     CBG: Recent Labs  Lab 08/22/19 1111 08/22/19 1505 08/22/19 2053 08/22/19 2350 08/23/19 0344  GLUCAP 140* 152* 123* 152* 145*     Critical care time:     CRITICAL CARE   The patient is critically ill with multiple organ systems failure and requires high complexity decision making for assessment and support, frequent evaluation and titration of therapies, application of advanced monitoring technologies and extensive interpretation of multiple  databases. Critical Care Time devoted to patient care services described in this note independent of APP/resident time (if applicable)  is 40 minutes.   Erskine Emery MD [facility] 08/23/2019 7:54 AM Personal pager: [phone] If unanswered, please page CCM On-call: [phone]    08/23/2019, 7:54 AM

## 2019-08-23 NOTE — Progress Notes (Signed)
ANTICOAGULATION CONSULT NOTE  Pharmacy Consult for Heparin Indication: ECMO  No Known Allergies  Patient Measurements: Height: 5' 8"  (172.7 cm) Weight: 293 lb 14 oz (133.3 kg) IBW/kg (Calculated) : 68.4 Heparin Dosing Weight: 100.7 kg  Vital Signs: Temp: 97.9 F (36.6 C) (01/15 0800) Temp Source: Axillary (01/15 0800) Pulse Rate: 89 (01/15 1200)  Labs: Recent Labs    08/21/19 0203 08/21/19 0510 08/22/19 0336 08/22/19 0347 08/22/19 1736 08/22/19 2055 08/22/19 2140 08/22/19 2140 08/23/19 0335 08/23/19 0335 08/23/19 0346 08/23/19 0346 08/23/19 1018 08/23/19 1156 08/23/19 1200  HGB 10.0*   < > 8.5*   < > 8.1*   < > 8.2*   < > 7.9*   < > 8.2*   < > 8.3* 8.8*  --   HCT 33.7*   < > 28.0*   < > 27.3*   < > 27.7*   < > 26.8*   < > 24.0*  --  28.6* 26.0*  --   PLT 89*   < > 84*   < > 90*   < > 83*  --  91*  --   --   --  102*  --   --   APTT 75*   < > 121*  --  108*  --   --   --  83*  --   --   --   --   --   --   LABPROT 14.4  --  14.8  --   --   --   --   --  16.2*  --   --   --   --   --   --   INR 1.1  --  1.2  --   --   --   --   --  1.3*  --   --   --   --   --   --   HEPARINUNFRC 0.32   < > 0.33   < > 0.28*  --   --   --  0.28*  --   --   --   --   --  0.34  CREATININE 0.37*   < > 0.44*  --  0.48*  --   --   --  0.41*  --   --   --   --   --   --    < > = values in this interval not displayed.    Estimated Creatinine Clearance: 155.7 mL/min (A) (by C-G formula based on SCr of 0.41 mg/dL (L)).  Assessment: 44 yo male known COVID+ and ARDS VV ECMO. Heparin was restarted 1/5 (bivalirudin stopped as HIT test was negative).  Heparin level therapeutic this afternoon after rate adjustment. Will resume q12h checks (0500/1700).   Goal of Therapy:  Heparin level: 0.3-0.5 units/mL APTT goal: 66-85 sec Monitor platelets by anticoagulation protocol: Yes   Plan:  -Continue heparin 2300 units/hr -Check q12h heparin level 0500/1700   Arrie Senate, PharmD,  BCPS Clinical Pharmacist [phone] Please check AMION for all [facility] Pharmacy numbers 08/23/2019

## 2019-08-23 NOTE — Progress Notes (Signed)
PT Cancellation Note  Patient Details Name: [name] MRN: [id number] DOB: Jun 15, 1976   Cancelled Treatment:    Reason Eval/Treat Not Completed: Patient at procedure or test/unavailable . PT attempted to see patient for evaluation and verticalization therapy however upon arrival RN reporting the need to change line at this time. PT will follow up as time allows. PT and RN discussed possibly scheduling tilt therapy on 10 and 4 hours to standardize therapy.  Zenaida Niece 08/23/2019, 5:35 PM

## 2019-08-23 NOTE — Progress Notes (Signed)
ANTICOAGULATION CONSULT NOTE  Pharmacy Consult for Heparin Indication: ECMO  No Known Allergies  Patient Measurements: Height: 5' 8"  (172.7 cm) Weight: 293 lb 14 oz (133.3 kg) IBW/kg (Calculated) : 68.4 Heparin Dosing Weight: 100.7 kg  Vital Signs: Temp: 98 F (36.7 C) (01/15 1600) Temp Source: Axillary (01/15 1600) Pulse Rate: 47 (01/15 1700)  Labs: Recent Labs    08/21/19 0203 08/21/19 0510 08/22/19 0336 08/22/19 0347 08/22/19 1736 08/22/19 2055 08/23/19 0335 08/23/19 0346 08/23/19 1018 08/23/19 1018 08/23/19 1156 08/23/19 1156 08/23/19 1200 08/23/19 1338 08/23/19 1622 08/23/19 1700  HGB 10.0*   < > 8.5*   < > 8.1*   < > 7.9*   < > 8.3*   < > 8.8*   < >  --  8.8* 8.0*  --   HCT 33.7*   < > 28.0*   < > 27.3*   < > 26.8*   < > 28.6*   < > 26.0*  --   --  26.0* 27.7*  --   PLT 89*   < > 84*   < > 90*   < > 91*  --  102*  --   --   --   --   --  96*  --   APTT 75*   < > 121*  --  108*  --  83*  --   --   --   --   --   --   --   --   --   LABPROT 14.4  --  14.8  --   --   --  16.2*  --   --   --   --   --   --   --   --   --   INR 1.1  --  1.2  --   --   --  1.3*  --   --   --   --   --   --   --   --   --   HEPARINUNFRC 0.32   < > 0.33   < > 0.28*   < > 0.28*  --   --   --   --   --  0.34  --   --  0.36  CREATININE 0.37*   < > 0.44*   < > 0.48*  --  0.41*  --   --   --   --   --   --   --  0.56*  --    < > = values in this interval not displayed.    Estimated Creatinine Clearance: 155.7 mL/min (A) (by C-G formula based on SCr of 0.56 mg/dL (L)).  Assessment: 44 yo male known COVID+ and ARDS VV ECMO. Heparin was restarted 1/5 (bivalirudin stopped as HIT test was negative).  Heparin level was therapeutic at 0.36, on 2300 units/hr. No s/sx of bleeding or infusion issues. Hgb 8, plt 96.    Goal of Therapy:  Heparin level: 0.3-0.5 units/mL APTT goal: 66-85 sec Monitor platelets by anticoagulation protocol: Yes   Plan:  -Continue heparin 2300 units/hr -Check  q12h heparin level 0500/1700 -Monitor CBC and for s/sx of bleeding  Antonietta Jewel, PharmD, BCCCP Clinical Pharmacist  Phone: [phone]  Please check AMION for all [place] phone numbers After 10:00 PM, call [place] [phone] 08/23/2019

## 2019-08-23 NOTE — Progress Notes (Signed)
RT note: Assisted DR Tamala Julian with bedside  Bronchoscope vital signs stable through out.

## 2019-08-23 NOTE — Procedures (Signed)
Bronchoscopy  Indication:Mucus plugging  Consent: Signed in chart  Anesthesia: In place for ECMO  Procedure - Timeout performed - Bronchoscope advanced through ETT - Findings as below  Findings - Complete occlusion of right lower lobe bronchus with blood clots, successfully suctioned; distal mucus plugging found in sub-subsegmental bronchi diffusely, incompletely suctioned - Underlying mucosa looks mildly irritated - Good trach position  Specimen(s): none  Complications: none immediate

## 2019-08-23 NOTE — TOC Initial Note (Signed)
Transition of Care [facility]) - Initial/Assessment Note    Patient Details  Name: [name] MRN: [id number] Date of Birth: [birth year]  [facility] CM/SW Contact:    Bethena Roys, RN Phone Number: 08/23/2019, 3:33 PM  Clinical Narrative:   Patient presented for COVID pneumonia causing acute hypoxemic respiratory failure. Prior to arrival patient was from home with support of spouse and family. Palliative is following for goals of care. Case Manager will continue to follow for transition of care needs as the patient progresses.                 Expected Discharge Plan: unsure at this time of plan Barriers to Discharge: Continued Medical Work up(Continues on IV Rocephin, Nimbex and Precedex.)    Expected Discharge Plan and Services Expected Discharge Plan: [place]   Discharge Planning Services: CM Consult   Living arrangements for the past 2 months: Single Family Home                   Prior Living Arrangements/Services Living arrangements for the past 2 months: Single Family Home Lives with:: Spouse    Activities of Daily Living Home Assistive Devices/Equipment: None ADL Screening (condition at time of admission) Patient's cognitive ability adequate to safely complete daily activities?: Yes Is the patient deaf or have difficulty hearing?: No Does the patient have difficulty seeing, even when wearing glasses/contacts?: No Does the patient have difficulty concentrating, remembering, or making decisions?: No Patient able to express need for assistance with ADLs?: Yes Does the patient have difficulty dressing or bathing?: No Independently performs ADLs?: Yes (appropriate for developmental age) Does the patient have difficulty walking or climbing stairs?: No Weakness of Legs: Both Weakness of Arms/Hands: None    Alcohol / Substance Use: Not Applicable Psych Involvement: No (comment)  Admission diagnosis:  Persistent asthma with  undetermined severity [J45.998] Acute hypoxemic respiratory failure due to COVID-19 ([place]) [U07.1, J96.01] Pneumonia due to COVID-19 virus [U07.1, J12.82] COVID-19 [U07.1] Patient Active Problem List   Diagnosis Date Noted  . Pressure injury of skin 08/22/2019  . Need for emotional support   . Chest tube in place   . Personal history of ECMO   . Advanced care planning/counseling discussion   . Acute respiratory distress syndrome (ARDS) due to COVID-19 virus ([place])   . Palliative care by specialist   . Goals of care, counseling/discussion   . Advanced directives, counseling/discussion   . Subcutaneous crepitus   . Pneumonia due to COVID-19 virus 07/23/2019  . Acute respiratory failure with hypoxia ([place]) 07/22/2019  . Persistent asthma with undetermined severity   . Thrombocytopenia ([place])   . Leukopenia   . Fever   . Gram positive bacterial infection   . Septic arthritis of right acromioclavicular joint ([place]) 09/29/2017  . Chronic left-sided low back pain with left-sided sciatica 09/19/2017  . Epigastric pain   . Pancreatitis 04/17/2017  . Essential hypertension 04/17/2017  . Moderate persistent asthma 07/21/2015  . Allergic rhinoconjunctivitis 07/21/2015  . GERD (gastroesophageal reflux disease) 07/21/2015  . ABNORMALITY OF GAIT 11/12/2009  . TARSAL TUNNEL SYNDROME, LEFT 10/08/2009  . PES PLANUS 10/08/2009   PCP:  Tamsen Roers, MD Pharmacy:   [id number] [place], [place] - [address] [place] [address]Phone: [phone]: [phone] Readmission Risk Interventions No flowsheet data found.

## 2019-08-23 NOTE — Progress Notes (Signed)
ANTICOAGULATION CONSULT NOTE  Pharmacy Consult for Heparin Indication: ECMO  No Known Allergies  Patient Measurements: Height: 5' 8"  (172.7 cm) Weight: 293 lb 14 oz (133.3 kg) IBW/kg (Calculated) : 68.4 Heparin Dosing Weight: 100.7 kg  Vital Signs: Temp: 98.1 F (36.7 C) (01/14 2356) Temp Source: Oral (01/14 2356) Pulse Rate: 82 (01/15 0400)  Labs: Recent Labs    08/21/19 0203 08/21/19 0510 08/21/19 1621 08/21/19 2033 08/22/19 0336 08/22/19 0347 08/22/19 1736 08/22/19 2055 08/22/19 2140 08/22/19 2140 08/23/19 0335 08/23/19 0346  HGB 10.0*   < > 8.7*   < > 8.5*   < > 8.1*   < > 8.2*   < > 7.9* 8.2*  HCT 33.7*   < > 29.2*   < > 28.0*   < > 27.3*   < > 27.7*  --  26.8* 24.0*  PLT 89*   < > 83*   < > 84*   < > 90*  --  83*  --  91*  --   APTT 75*   < > 108*  --  121*  --  108*  --   --   --   --   --   LABPROT 14.4  --   --   --  14.8  --   --   --   --   --   --   --   INR 1.1  --   --   --  1.2  --   --   --   --   --   --   --   HEPARINUNFRC 0.32   < > 0.41   < > 0.33  --  0.28*  --   --   --  0.28*  --   CREATININE 0.37*   < > 0.43*   < > 0.44*  --  0.48*  --   --   --  0.41*  --    < > = values in this interval not displayed.    Estimated Creatinine Clearance: 155.7 mL/min (A) (by C-G formula based on SCr of 0.41 mg/dL (L)).  Assessment: 44 yo male known COVID+ and ARDS VV ECMO. Heparin was restarted 1/5 (bivalirudin stopped as HIT test was negative).  Heparin level therapeutic this evening at 0.41.  APTT slightly high, but heparin level appears to be the more accurate lab. H/H and pltc stable.  1/15 AM update:  Heparin level just below goal Hgb low but stable from yesterday   Goal of Therapy:  Heparin level: 0.3-0.5 units/mL APTT goal: 66-85 sec Monitor platelets by anticoagulation protocol: Yes   Plan:  -Inc heparin to 2300 units/hr -Re-check heparin level at [place], PharmD, [place] Pharmacist Phone:  [phone]

## 2019-08-24 ENCOUNTER — Inpatient Hospital Stay (HOSPITAL_COMMUNITY): Payer: Managed Care, Other (non HMO)

## 2019-08-24 LAB — POCT I-STAT 7, (LYTES, BLD GAS, ICA,H+H)
Acid-Base Excess: 1 mmol/L (ref 0.0–2.0)
Acid-Base Excess: 1 mmol/L (ref 0.0–2.0)
Bicarbonate: 25.4 mmol/L (ref 20.0–28.0)
Bicarbonate: 25.5 mmol/L (ref 20.0–28.0)
Bicarbonate: 25.6 mmol/L (ref 20.0–28.0)
Bicarbonate: 26.3 mmol/L (ref 20.0–28.0)
Bicarbonate: 27 mmol/L (ref 20.0–28.0)
Calcium, Ion: 1.33 mmol/L (ref 1.15–1.40)
Calcium, Ion: 1.34 mmol/L (ref 1.15–1.40)
Calcium, Ion: 1.35 mmol/L (ref 1.15–1.40)
Calcium, Ion: 1.35 mmol/L (ref 1.15–1.40)
Calcium, Ion: 1.36 mmol/L (ref 1.15–1.40)
HCT: 23 % — ABNORMAL LOW (ref 39.0–52.0)
HCT: 23 % — ABNORMAL LOW (ref 39.0–52.0)
HCT: 25 % — ABNORMAL LOW (ref 39.0–52.0)
HCT: 25 % — ABNORMAL LOW (ref 39.0–52.0)
HCT: 26 % — ABNORMAL LOW (ref 39.0–52.0)
Hemoglobin: 7.8 g/dL — ABNORMAL LOW (ref 13.0–17.0)
Hemoglobin: 7.8 g/dL — ABNORMAL LOW (ref 13.0–17.0)
Hemoglobin: 8.5 g/dL — ABNORMAL LOW (ref 13.0–17.0)
Hemoglobin: 8.5 g/dL — ABNORMAL LOW (ref 13.0–17.0)
Hemoglobin: 8.8 g/dL — ABNORMAL LOW (ref 13.0–17.0)
O2 Saturation: 83 %
O2 Saturation: 88 %
O2 Saturation: 89 %
O2 Saturation: 91 %
O2 Saturation: 91 %
Patient temperature: 36.4
Patient temperature: 36.5
Patient temperature: 36.5
Patient temperature: 36.6
Patient temperature: 36.6
Potassium: 3.9 mmol/L (ref 3.5–5.1)
Potassium: 4 mmol/L (ref 3.5–5.1)
Potassium: 4.2 mmol/L (ref 3.5–5.1)
Potassium: 4.2 mmol/L (ref 3.5–5.1)
Potassium: 4.3 mmol/L (ref 3.5–5.1)
Sodium: 147 mmol/L — ABNORMAL HIGH (ref 135–145)
Sodium: 147 mmol/L — ABNORMAL HIGH (ref 135–145)
Sodium: 148 mmol/L — ABNORMAL HIGH (ref 135–145)
Sodium: 149 mmol/L — ABNORMAL HIGH (ref 135–145)
Sodium: 149 mmol/L — ABNORMAL HIGH (ref 135–145)
TCO2: 27 mmol/L (ref 22–32)
TCO2: 27 mmol/L (ref 22–32)
TCO2: 27 mmol/L (ref 22–32)
TCO2: 28 mmol/L (ref 22–32)
TCO2: 28 mmol/L (ref 22–32)
pCO2 arterial: 42 mmHg (ref 32.0–48.0)
pCO2 arterial: 42.5 mmHg (ref 32.0–48.0)
pCO2 arterial: 43.8 mmHg (ref 32.0–48.0)
pCO2 arterial: 45.4 mmHg (ref 32.0–48.0)
pCO2 arterial: 46.4 mmHg (ref 32.0–48.0)
pH, Arterial: 7.356 (ref 7.350–7.450)
pH, Arterial: 7.37 (ref 7.350–7.450)
pH, Arterial: 7.383 (ref 7.350–7.450)
pH, Arterial: 7.384 (ref 7.350–7.450)
pH, Arterial: 7.389 (ref 7.350–7.450)
pO2, Arterial: 49 mmHg — ABNORMAL LOW (ref 83.0–108.0)
pO2, Arterial: 54 mmHg — ABNORMAL LOW (ref 83.0–108.0)
pO2, Arterial: 55 mmHg — ABNORMAL LOW (ref 83.0–108.0)
pO2, Arterial: 61 mmHg — ABNORMAL LOW (ref 83.0–108.0)
pO2, Arterial: 62 mmHg — ABNORMAL LOW (ref 83.0–108.0)

## 2019-08-24 LAB — CBC WITH DIFFERENTIAL/PLATELET
Abs Immature Granulocytes: 0.58 10*3/uL — ABNORMAL HIGH (ref 0.00–0.07)
Basophils Absolute: 0 10*3/uL (ref 0.0–0.1)
Basophils Relative: 1 %
Eosinophils Absolute: 0.2 10*3/uL (ref 0.0–0.5)
Eosinophils Relative: 5 %
HCT: 28.2 % — ABNORMAL LOW (ref 39.0–52.0)
Hemoglobin: 8.3 g/dL — ABNORMAL LOW (ref 13.0–17.0)
Immature Granulocytes: 13 %
Lymphocytes Relative: 26 %
Lymphs Abs: 1.2 10*3/uL (ref 0.7–4.0)
MCH: 28.7 pg (ref 26.0–34.0)
MCHC: 29.4 g/dL — ABNORMAL LOW (ref 30.0–36.0)
MCV: 97.6 fL (ref 80.0–100.0)
Monocytes Absolute: 0.8 10*3/uL (ref 0.1–1.0)
Monocytes Relative: 16 %
Neutro Abs: 1.8 10*3/uL (ref 1.7–7.7)
Neutrophils Relative %: 39 %
Platelets: 91 10*3/uL — ABNORMAL LOW (ref 150–400)
RBC: 2.89 MIL/uL — ABNORMAL LOW (ref 4.22–5.81)
RDW: 17.6 % — ABNORMAL HIGH (ref 11.5–15.5)
WBC: 4.6 10*3/uL (ref 4.0–10.5)
nRBC: 1.5 % — ABNORMAL HIGH (ref 0.0–0.2)

## 2019-08-24 LAB — CBC
HCT: 26.1 % — ABNORMAL LOW (ref 39.0–52.0)
HCT: 28.3 % — ABNORMAL LOW (ref 39.0–52.0)
Hemoglobin: 7.7 g/dL — ABNORMAL LOW (ref 13.0–17.0)
Hemoglobin: 8.4 g/dL — ABNORMAL LOW (ref 13.0–17.0)
MCH: 29.3 pg (ref 26.0–34.0)
MCH: 28.9 pg (ref 26.0–34.0)
MCHC: 29.5 g/dL — ABNORMAL LOW (ref 30.0–36.0)
MCHC: 29.7 g/dL — ABNORMAL LOW (ref 30.0–36.0)
MCV: 99.2 fL (ref 80.0–100.0)
MCV: 97.3 fL (ref 80.0–100.0)
Platelets: 86 10*3/uL — ABNORMAL LOW (ref 150–400)
Platelets: 86 10*3/uL — ABNORMAL LOW (ref 150–400)
RBC: 2.63 MIL/uL — ABNORMAL LOW (ref 4.22–5.81)
RBC: 2.91 MIL/uL — ABNORMAL LOW (ref 4.22–5.81)
RDW: 17.8 % — ABNORMAL HIGH (ref 11.5–15.5)
RDW: 17.5 % — ABNORMAL HIGH (ref 11.5–15.5)
WBC: 4.3 10*3/uL (ref 4.0–10.5)
WBC: 4.2 10*3/uL (ref 4.0–10.5)
nRBC: 2.1 % — ABNORMAL HIGH (ref 0.0–0.2)
nRBC: 2.6 % — ABNORMAL HIGH (ref 0.0–0.2)

## 2019-08-24 LAB — BASIC METABOLIC PANEL
Anion gap: 11 (ref 5–15)
Anion gap: 9 (ref 5–15)
BUN: 38 mg/dL — ABNORMAL HIGH (ref 6–20)
BUN: 38 mg/dL — ABNORMAL HIGH (ref 6–20)
CO2: 24 mmol/L (ref 22–32)
CO2: 23 mmol/L (ref 22–32)
Calcium: 9 mg/dL (ref 8.9–10.3)
Calcium: 8.9 mg/dL (ref 8.9–10.3)
Chloride: 109 mmol/L (ref 98–111)
Chloride: 111 mmol/L (ref 98–111)
Creatinine, Ser: 0.35 mg/dL — ABNORMAL LOW (ref 0.61–1.24)
Creatinine, Ser: 0.43 mg/dL — ABNORMAL LOW (ref 0.61–1.24)
GFR calc Af Amer: >60 mL/min (ref >60)
GFR calc Af Amer: >60 mL/min (ref >60)
GFR calc non Af Amer: >60 mL/min (ref >60)
GFR calc non Af Amer: >60 mL/min (ref >60)
Glucose, Bld: 176 mg/dL — ABNORMAL HIGH (ref 70–99)
Glucose, Bld: 158 mg/dL — ABNORMAL HIGH (ref 70–99)
Potassium: 3.9 mmol/L (ref 3.5–5.1)
Potassium: 4.2 mmol/L (ref 3.5–5.1)
Sodium: 144 mmol/L (ref 135–145)
Sodium: 143 mmol/L (ref 135–145)

## 2019-08-24 LAB — COOXEMETRY PANEL
Carboxyhemoglobin: 3.5 % — ABNORMAL HIGH (ref 0.5–1.5)
Methemoglobin: 0.8 % (ref 0.0–1.5)
O2 Saturation: 77 %
Total hemoglobin: 7.8 g/dL — ABNORMAL LOW (ref 12.0–16.0)

## 2019-08-24 LAB — GLUCOSE, CAPILLARY
Glucose-Capillary: 163 mg/dL — ABNORMAL HIGH (ref 70–99)
Glucose-Capillary: 156 mg/dL — ABNORMAL HIGH (ref 70–99)
Glucose-Capillary: 149 mg/dL — ABNORMAL HIGH (ref 70–99)
Glucose-Capillary: 172 mg/dL — ABNORMAL HIGH (ref 70–99)
Glucose-Capillary: 160 mg/dL — ABNORMAL HIGH (ref 70–99)
Glucose-Capillary: 142 mg/dL — ABNORMAL HIGH (ref 70–99)

## 2019-08-24 LAB — APTT
aPTT: 78 seconds — ABNORMAL HIGH (ref 24–36)
aPTT: 83 seconds — ABNORMAL HIGH (ref 24–36)
aPTT: 111 seconds — ABNORMAL HIGH (ref 24–36)

## 2019-08-24 LAB — LACTATE DEHYDROGENASE: LDH: 325 U/L — ABNORMAL HIGH (ref 98–192)

## 2019-08-24 LAB — HEPATIC FUNCTION PANEL
ALT: 27 U/L (ref 0–44)
AST: 19 U/L (ref 15–41)
Albumin: 2.6 g/dL — ABNORMAL LOW (ref 3.5–5.0)
Alkaline Phosphatase: 54 U/L (ref 38–126)
Bilirubin, Direct: 0.2 mg/dL (ref 0.0–0.2)
Indirect Bilirubin: 0.6 mg/dL (ref 0.3–0.9)
Total Bilirubin: 0.8 mg/dL (ref 0.3–1.2)
Total Protein: 5.9 g/dL — ABNORMAL LOW (ref 6.5–8.1)

## 2019-08-24 LAB — HEPARIN LEVEL (UNFRACTIONATED)
Heparin Unfractionated: 0.26 IU/mL — ABNORMAL LOW (ref 0.30–0.70)
Heparin Unfractionated: 0.28 IU/mL — ABNORMAL LOW (ref 0.30–0.70)
Heparin Unfractionated: 0.33 IU/mL (ref 0.30–0.70)

## 2019-08-24 LAB — PROTIME-INR
INR: 1.3 — ABNORMAL HIGH (ref 0.8–1.2)
Prothrombin Time: 16.1 seconds — ABNORMAL HIGH (ref 11.4–15.2)

## 2019-08-24 LAB — PREPARE RBC (CROSSMATCH)

## 2019-08-24 LAB — LACTIC ACID, PLASMA: Lactic Acid, Venous: 1.1 mmol/L (ref 0.5–1.9)

## 2019-08-24 LAB — FIBRINOGEN: Fibrinogen: 694 mg/dL — ABNORMAL HIGH (ref 210–475)

## 2019-08-24 LAB — MAGNESIUM: Magnesium: 1.8 mg/dL (ref 1.7–2.4)

## 2019-08-24 MED ORDER — SODIUM CHLORIDE 0.9 % IV SOLN
0.0000 ug/kg/min | INTRAVENOUS | Status: DC
  Administered 2019-08-24 – 2019-08-26 (×6): 3 ug/kg/min via INTRAVENOUS
  Administered 2019-08-26: 4 ug/kg/min via INTRAVENOUS
  Administered 2019-08-26: 3 ug/kg/min via INTRAVENOUS
  Administered 2019-08-27 (×3): 5.5 ug/kg/min via INTRAVENOUS
  Administered 2019-08-27: 20:00:00 4 ug/kg/min via INTRAVENOUS
  Administered 2019-08-27: 5 ug/kg/min via INTRAVENOUS
  Administered 2019-08-28: 7 ug/kg/min via INTRAVENOUS
  Administered 2019-08-28: 9 ug/kg/min via INTRAVENOUS
  Administered 2019-08-28: 02:00:00 6 ug/kg/min via INTRAVENOUS
  Administered 2019-08-28 – 2019-08-29 (×4): 7 ug/kg/min via INTRAVENOUS
  Administered 2019-08-29: 5 ug/kg/min via INTRAVENOUS
  Administered 2019-08-29 (×3): 7 ug/kg/min via INTRAVENOUS
  Administered 2019-08-30: 2 ug/kg/min via INTRAVENOUS
  Administered 2019-08-30: 6 ug/kg/min via INTRAVENOUS
  Administered 2019-08-30: 04:00:00 10 ug/kg/min via INTRAVENOUS
  Filled 2019-08-24 (×35): qty 20

## 2019-08-24 MED ORDER — SODIUM CHLORIDE 0.9 % IV SOLN
INTRAVENOUS | Status: DC | PRN
  Administered 2019-08-24 – 2019-08-30 (×3): 500 mL via INTRAVENOUS
  Administered 2019-09-05: 1000 mL via INTRAVENOUS
  Administered 2019-09-09: 250 mL via INTRAVENOUS
  Administered 2019-09-10: 500 mL via INTRAVENOUS
  Administered 2019-09-20: 23:00:00 1000 mL via INTRAVENOUS
  Administered 2019-09-27: 500 mL via INTRAVENOUS
  Administered 2019-10-06 – 2019-11-14 (×4): 250 mL via INTRAVENOUS

## 2019-08-24 MED ORDER — MAGNESIUM SULFATE 2 GM/50ML IV SOLN
2.0000 g | Freq: Once | INTRAVENOUS | Status: AC
  Administered 2019-08-24: 07:00:00 2 g via INTRAVENOUS
  Filled 2019-08-24: qty 50

## 2019-08-24 MED ORDER — DEXMEDETOMIDINE HCL IN NACL 400 MCG/100ML IV SOLN
0.0000 ug/kg/h | INTRAVENOUS | Status: DC
  Administered 2019-08-24 – 2019-09-02 (×101): 1.6 ug/kg/h via INTRAVENOUS
  Administered 2019-09-02: 0.563 ug/kg/h via INTRAVENOUS
  Administered 2019-09-02 – 2019-09-06 (×50): 1.6 ug/kg/h via INTRAVENOUS
  Administered 2019-09-06: 1.4 ug/kg/h via INTRAVENOUS
  Administered 2019-09-06 – 2019-09-07 (×12): 1.6 ug/kg/h via INTRAVENOUS
  Administered 2019-09-08: 1.5 ug/kg/h via INTRAVENOUS
  Administered 2019-09-08 (×2): 1.6 ug/kg/h via INTRAVENOUS
  Administered 2019-09-08 (×2): 1.5 ug/kg/h via INTRAVENOUS
  Administered 2019-09-08: 17:00:00 1.2 ug/kg/h via INTRAVENOUS
  Administered 2019-09-08: 1.5 ug/kg/h via INTRAVENOUS
  Administered 2019-09-08: 1.2 ug/kg/h via INTRAVENOUS
  Administered 2019-09-08 – 2019-09-09 (×7): 1.5 ug/kg/h via INTRAVENOUS
  Administered 2019-09-09: 1.6 ug/kg/h via INTRAVENOUS
  Administered 2019-09-09 (×2): 1.5 ug/kg/h via INTRAVENOUS
  Administered 2019-09-09 (×2): 0.9 ug/kg/h via INTRAVENOUS
  Administered 2019-09-09 (×2): 1.6 ug/kg/h via INTRAVENOUS
  Administered 2019-09-09: 09:00:00 1.5 ug/kg/h via INTRAVENOUS
  Administered 2019-09-10 (×2): 1.2 ug/kg/h via INTRAVENOUS
  Administered 2019-09-10: 1.6 ug/kg/h via INTRAVENOUS
  Administered 2019-09-10: 0.9 ug/kg/h via INTRAVENOUS
  Administered 2019-09-10 (×5): 1.2 ug/kg/h via INTRAVENOUS
  Administered 2019-09-11: 09:00:00 1.6 ug/kg/h via INTRAVENOUS
  Administered 2019-09-11 (×2): 1.4 ug/kg/h via INTRAVENOUS
  Administered 2019-09-11 (×7): 1.6 ug/kg/h via INTRAVENOUS
  Administered 2019-09-11 – 2019-09-12 (×4): 1.4 ug/kg/h via INTRAVENOUS
  Administered 2019-09-12: 1 ug/kg/h via INTRAVENOUS
  Administered 2019-09-12 (×3): 1.4 ug/kg/h via INTRAVENOUS
  Administered 2019-09-12: 02:00:00 1.5 ug/kg/h via INTRAVENOUS
  Administered 2019-09-12: 1.4 ug/kg/h via INTRAVENOUS
  Administered 2019-09-12: 1.5 ug/kg/h via INTRAVENOUS
  Administered 2019-09-12: 1.6 ug/kg/h via INTRAVENOUS
  Administered 2019-09-13 (×8): 1.3 ug/kg/h via INTRAVENOUS
  Administered 2019-09-13: 1.4 ug/kg/h via INTRAVENOUS
  Administered 2019-09-13 – 2019-09-14 (×4): 1.3 ug/kg/h via INTRAVENOUS
  Administered 2019-09-14: 1.2 ug/kg/h via INTRAVENOUS
  Administered 2019-09-14: 1.1 ug/kg/h via INTRAVENOUS
  Administered 2019-09-14: 10:00:00 1.101 ug/kg/h via INTRAVENOUS
  Administered 2019-09-14 – 2019-09-15 (×6): 1.1 ug/kg/h via INTRAVENOUS
  Administered 2019-09-15: 05:00:00 1 ug/kg/h via INTRAVENOUS
  Administered 2019-09-15 – 2019-09-16 (×9): 1.1 ug/kg/h via INTRAVENOUS
  Administered 2019-09-16 (×2): 0.5 ug/kg/h via INTRAVENOUS
  Administered 2019-09-16: 14:00:00 0.9 ug/kg/h via INTRAVENOUS
  Administered 2019-09-16: 07:00:00 1.1 ug/kg/h via INTRAVENOUS
  Administered 2019-09-17: 18:00:00 0.4 ug/kg/h via INTRAVENOUS
  Administered 2019-09-17: 04:00:00 0.6 ug/kg/h via INTRAVENOUS
  Administered 2019-09-17 – 2019-09-19 (×6): 0.4 ug/kg/h via INTRAVENOUS
  Administered 2019-09-19: 0.25 ug/kg/h via INTRAVENOUS
  Administered 2019-09-20: 22:00:00 0.5 ug/kg/h via INTRAVENOUS
  Administered 2019-09-20 (×2): 0.2 ug/kg/h via INTRAVENOUS
  Administered 2019-09-21: 17:00:00 0.4 ug/kg/h via INTRAVENOUS
  Administered 2019-09-21: 04:00:00 0.5 ug/kg/h via INTRAVENOUS
  Administered 2019-09-22: 07:00:00 0.2 ug/kg/h via INTRAVENOUS
  Administered 2019-09-22: 0.3 ug/kg/h via INTRAVENOUS
  Administered 2019-10-02: 0.2 ug/kg/h via INTRAVENOUS
  Administered 2019-10-02: 0.05 ug/kg/h via INTRAVENOUS
  Administered 2019-10-03: 0.2 ug/kg/h via INTRAVENOUS
  Administered 2019-10-04: 21:00:00 0.1 ug/kg/h via INTRAVENOUS
  Administered 2019-10-05 – 2019-10-07 (×3): 0.4 ug/kg/h via INTRAVENOUS
  Administered 2019-10-08: 0.7 ug/kg/h via INTRAVENOUS
  Administered 2019-10-08: .7 ug/kg/h via INTRAVENOUS
  Administered 2019-10-08: 0.7 ug/kg/h via INTRAVENOUS
  Administered 2019-10-09: 0.2 ug/kg/h via INTRAVENOUS
  Administered 2019-10-09: 0.5 ug/kg/h via INTRAVENOUS
  Administered 2019-10-10: 0.4 ug/kg/h via INTRAVENOUS
  Administered 2019-10-10: 0.3 ug/kg/h via INTRAVENOUS
  Administered 2019-10-10: 0.9 ug/kg/h via INTRAVENOUS
  Administered 2019-10-11: 0.5 ug/kg/h via INTRAVENOUS
  Administered 2019-10-11: 0.6 ug/kg/h via INTRAVENOUS
  Administered 2019-10-12: 0.2 ug/kg/h via INTRAVENOUS
  Administered 2019-10-13: 0.5 ug/kg/h via INTRAVENOUS
  Administered 2019-10-13: 0.2 ug/kg/h via INTRAVENOUS
  Administered 2019-10-13 – 2019-10-17 (×8): 0.3 ug/kg/h via INTRAVENOUS
  Administered 2019-10-18: 0.5 ug/kg/h via INTRAVENOUS
  Administered 2019-10-18: 0.3 ug/kg/h via INTRAVENOUS
  Administered 2019-10-19 – 2019-10-21 (×4): 0.2 ug/kg/h via INTRAVENOUS
  Administered 2019-10-22: 0.6 ug/kg/h via INTRAVENOUS
  Administered 2019-10-22: 0.4 ug/kg/h via INTRAVENOUS
  Filled 2019-08-24: qty 200
  Filled 2019-08-24: qty 100
  Filled 2019-08-24: qty 300
  Filled 2019-08-24: qty 200
  Filled 2019-08-24: qty 100
  Filled 2019-08-24: qty 300
  Filled 2019-08-24: qty 100
  Filled 2019-08-24 (×2): qty 200
  Filled 2019-08-24 (×3): qty 100
  Filled 2019-08-24 (×2): qty 200
  Filled 2019-08-24 (×6): qty 100
  Filled 2019-08-24: qty 200
  Filled 2019-08-24 (×7): qty 100
  Filled 2019-08-24: qty 200
  Filled 2019-08-24: qty 100
  Filled 2019-08-24: qty 200
  Filled 2019-08-24: qty 300
  Filled 2019-08-24 (×4): qty 100
  Filled 2019-08-24 (×3): qty 200
  Filled 2019-08-24 (×4): qty 100
  Filled 2019-08-24: qty 200
  Filled 2019-08-24 (×4): qty 100
  Filled 2019-08-24: qty 300
  Filled 2019-08-24: qty 100
  Filled 2019-08-24: qty 200
  Filled 2019-08-24: qty 100
  Filled 2019-08-24: qty 200
  Filled 2019-08-24 (×5): qty 100
  Filled 2019-08-24: qty 200
  Filled 2019-08-24: qty 100
  Filled 2019-08-24: qty 300
  Filled 2019-08-24 (×5): qty 100
  Filled 2019-08-24: qty 300
  Filled 2019-08-24 (×2): qty 200
  Filled 2019-08-24: qty 100
  Filled 2019-08-24 (×2): qty 200
  Filled 2019-08-24: qty 100
  Filled 2019-08-24: qty 200
  Filled 2019-08-24: qty 100
  Filled 2019-08-24: qty 200
  Filled 2019-08-24: qty 100
  Filled 2019-08-24 (×3): qty 200
  Filled 2019-08-24: qty 300
  Filled 2019-08-24: qty 100
  Filled 2019-08-24: qty 200
  Filled 2019-08-24: qty 100
  Filled 2019-08-24: qty 200
  Filled 2019-08-24: qty 100
  Filled 2019-08-24 (×2): qty 200
  Filled 2019-08-24: qty 100
  Filled 2019-08-24: qty 300
  Filled 2019-08-24: qty 100
  Filled 2019-08-24: qty 200
  Filled 2019-08-24 (×3): qty 100
  Filled 2019-08-24: qty 300
  Filled 2019-08-24: qty 200
  Filled 2019-08-24 (×2): qty 100
  Filled 2019-08-24: qty 200
  Filled 2019-08-24 (×2): qty 100
  Filled 2019-08-24: qty 300
  Filled 2019-08-24 (×2): qty 200
  Filled 2019-08-24: qty 100
  Filled 2019-08-24 (×2): qty 300
  Filled 2019-08-24 (×3): qty 100
  Filled 2019-08-24: qty 200
  Filled 2019-08-24 (×9): qty 100
  Filled 2019-08-24 (×2): qty 200
  Filled 2019-08-24 (×3): qty 100
  Filled 2019-08-24: qty 300
  Filled 2019-08-24: qty 200
  Filled 2019-08-24 (×2): qty 100
  Filled 2019-08-24: qty 200
  Filled 2019-08-24: qty 100
  Filled 2019-08-24 (×3): qty 200
  Filled 2019-08-24: qty 100
  Filled 2019-08-24: qty 200
  Filled 2019-08-24 (×12): qty 100
  Filled 2019-08-24: qty 200
  Filled 2019-08-24 (×2): qty 100
  Filled 2019-08-24 (×2): qty 200
  Filled 2019-08-24 (×2): qty 100
  Filled 2019-08-24: qty 200
  Filled 2019-08-24 (×2): qty 100
  Filled 2019-08-24: qty 200
  Filled 2019-08-24 (×3): qty 100
  Filled 2019-08-24: qty 200
  Filled 2019-08-24 (×2): qty 100
  Filled 2019-08-24 (×2): qty 200
  Filled 2019-08-24 (×14): qty 100
  Filled 2019-08-24 (×2): qty 200
  Filled 2019-08-24 (×2): qty 300
  Filled 2019-08-24: qty 200
  Filled 2019-08-24 (×3): qty 100
  Filled 2019-08-24: qty 200
  Filled 2019-08-24 (×5): qty 100
  Filled 2019-08-24 (×4): qty 200
  Filled 2019-08-24 (×3): qty 100
  Filled 2019-08-24: qty 200
  Filled 2019-08-24 (×2): qty 100
  Filled 2019-08-24: qty 300
  Filled 2019-08-24 (×3): qty 100
  Filled 2019-08-24 (×2): qty 200
  Filled 2019-08-24 (×5): qty 100
  Filled 2019-08-24: qty 300
  Filled 2019-08-24: qty 200
  Filled 2019-08-24: qty 400
  Filled 2019-08-24: qty 100
  Filled 2019-08-24: qty 200
  Filled 2019-08-24: qty 100
  Filled 2019-08-24: qty 200
  Filled 2019-08-24 (×2): qty 100
  Filled 2019-08-24: qty 200

## 2019-08-24 MED ORDER — DEXMEDETOMIDINE HCL IN NACL 1000 MCG/250ML IV SOLN: 0.4000 ug/kg/h | INTRAVENOUS | Status: DC

## 2019-08-24 MED ORDER — SODIUM CHLORIDE 0.9% IV SOLUTION: Freq: Once | INTRAVENOUS | Status: AC

## 2019-08-24 MED ORDER — POTASSIUM CHLORIDE 20 MEQ/15ML (10%) PO SOLN
40.0000 meq | Freq: Once | ORAL | Status: AC
  Administered 2019-08-24: 05:00:00 40 meq
  Filled 2019-08-24: qty 30

## 2019-08-24 MED ORDER — SODIUM CHLORIDE 0.9 % IV SOLN: INTRAVENOUS | Status: DC

## 2019-08-24 MED ORDER — ALBUMIN HUMAN 25 % IV SOLN
25.0000 g | Freq: Four times a day (QID) | INTRAVENOUS | Status: AC
  Administered 2019-08-24 – 2019-08-25 (×4): 25 g via INTRAVENOUS
  Filled 2019-08-24 (×3): qty 100
  Filled 2019-08-24: qty 50

## 2019-08-24 NOTE — Progress Notes (Signed)
ANTICOAGULATION CONSULT NOTE  Pharmacy Consult for Heparin Indication: ECMO  No Known Allergies  Patient Measurements: Height: 5' 8"  (172.7 cm) Weight: (!) 313 lb 0.9 oz (142 kg) IBW/kg (Calculated) : 68.4 Heparin Dosing Weight: 100.7 kg  Vital Signs: Temp: 97.9 F (36.6 C) (01/16 0500) Temp Source: Core (Comment) (01/16 0400) BP: 118/52 (01/15 2045) Pulse Rate: 78 (01/16 0500)  Labs: Recent Labs    08/22/19 0336 08/22/19 0347 08/22/19 1736 08/22/19 2055 08/23/19 0335 08/23/19 0346 08/23/19 1200 08/23/19 1338 08/23/19 1622 08/23/19 1622 08/23/19 1700 08/23/19 1933 08/23/19 1933 08/23/19 1942 08/23/19 1942 08/24/19 0352 08/24/19 0401  HGB 8.5*   < > 8.1*   < > 7.9*   < >  --    < > 8.0*   < >  --  7.9*   < > 7.8*   < > 8.3* 8.5*  HCT 28.0*   < > 27.3*   < > 26.8*   < >  --    < > 27.7*   < >  --  26.3*   < > 23.0*  --  28.2* 25.0*  PLT 84*   < > 90*   < > 91*   < >  --    < > 96*  --   --  95*  --   --   --  91*  --   APTT 121*  --  108*  --  83*  --   --   --   --   --   --   --   --   --   --   --   --   LABPROT 14.8  --   --   --  16.2*  --   --   --   --   --   --   --   --   --   --  16.1*  --   INR 1.2  --   --   --  1.3*  --   --   --   --   --   --   --   --   --   --  1.3*  --   HEPARINUNFRC 0.33   < > 0.28*   < > 0.28*   < > 0.34  --   --   --  0.36  --   --   --   --  0.26*  --   CREATININE 0.44*   < > 0.48*   < > 0.41*  --   --   --  0.56*  --   --   --   --   --   --  0.35*  --    < > = values in this interval not displayed.    Estimated Creatinine Clearance: 161.3 mL/min (A) (by C-G formula based on SCr of 0.35 mg/dL (L)).  Assessment: 44 yo male known COVID+ and ARDS VV ECMO. Heparin was restarted 1/5 (bivalirudin stopped as HIT test was negative).  Heparin level was therapeutic at 0.36, on 2300 units/hr. No s/sx of bleeding or infusion issues. Hgb 8, plt 96.   1/16 AM update:  Heparin level just below goal Hgb low but stable   Goal of  Therapy:  Heparin level: 0.3-0.5 units/mL APTT goal: 66-85 sec Monitor platelets by anticoagulation protocol: Yes   Plan:  -Inc heparin to 2400 units/hr -Re-check heparin level/aPTT at [place], PharmD, [name] Pharmacist Phone: [phone] ;

## 2019-08-24 NOTE — Progress Notes (Signed)
PT Cancellation Note  Patient Details Name: MERLAND HOLNESS MRN: [id number] DOB: 03-02-1976   Cancelled Treatment:    Reason Eval/Treat Not Completed: Patient not medically ready. Pt in tilt position upon PT arrival, having been standing at 40 degrees for approximately one hour. PT will attempt to return tomorrow for verticalization therapy.   Zenaida Niece 08/24/2019, 6:00 PM

## 2019-08-24 NOTE — Progress Notes (Addendum)
Spoke to patients wife, [name] over the telephone.   Obtained FMLA paperwork from front desk on her behalf.   Palliative team will complete this on Monday.  No charge for this encounter Tacey Ruiz

## 2019-08-24 NOTE — Progress Notes (Signed)
NAME:  [name], MRN:  [id number], DOB:  1976/01/20, LOS: 66 ADMISSION DATE:  07/22/2019, CONSULTATION DATE:  12/24 REFERRING MD:  Sloan Leiter, CHIEF COMPLAINT:  Dyspnea   Brief History   44 y/o M admitted 12/14 with COVID pneumonia causing acute hypoxemic respiratory failure. Developed pneumomediastinum 12/23 with concern for possible bilateral small pneumothoraces on 12/24.  PCCM consulted for evaluation of pneumothoraces  Past Medical History  GERD HTN Asthma  Significant Hospital Events   12/14 admit with hypoxemic respiratory failure in setting of COVID-19 pneumonia 12/23 noted to have pneumomediastinum on chest x-ray 12/24 PCCM consulted for evaluation 12/26 worsening hypoxemia, intubated  12/27 VV fem/fem ECMO cannulation See bedside nursing event notes for full rundown of procedures after 12/27   Consults:  PCCM, CTS, CHF, Palliative  Procedures:  See bedside nursing event notes for full rundown of procedures.  Significant Diagnostic Tests:  CT chest 12/22 >> extensive pneumomediastinum, multifocal patchy bilateral groundglass opacities  Micro Data:  BCx2 12/14 >> negative  Sputum 12/15 >> negative  1/9 bronch: serratia and MSSA 1/2 acid fast smear bronch: negative 1/2 fungal cx bronch: pending 1/12 BAL: serratia  Antimicrobials:  Received remdesivir, steroids, actemra and convalescent plasma  5 days cefepime 12/24-12/29 Cefepime 1/4->1/14 vanc 1/4->1/14 Ceftriaxone 1/14 >>   Interim history/subjective:  Sats improved switching to high bivent setting yesterday.  Objective   Blood pressure (!) 118/52, pulse 63, temperature 97.9 F (36.6 C), resp. rate (!) 0, height _0  (1.727 m), weight (!) 142 kg, SpO2 94 %.    Vent Mode: Bi-Vent FiO2 (%):  [60 %-100 %] 100 % Set Rate:  [20 bmp] 20 bmp PEEP:  [12 cmH20-20 cmH20] 20 cmH20 Plateau Pressure:  [30 cmH20-31 cmH20] 30 cmH20   Intake/Output Summary (Last 24 hours) at 08/24/2019 0700 Last data filed at  08/24/2019 0600 Gross per 24 hour  Intake 3447.61 ml  Output 3365 ml  Net 82.61 ml   Filed Weights   08/21/19 0500 08/22/19 0500 08/24/19 0400  Weight: 131.3 kg 133.3 kg (!) 142 kg   Examination GEN: obese ill man on vent HEENT: trach in place, small bloody sedations, nasal packing in place bilaterally, now more clear serous drainage from nose, small amount of blood in oropharynx CV: RRR, ext warm PULM: severely diminished but present bilaterally, +rhonci GI: Soft, + BS EXT: Global anascarca NEURO: sedated, paralyzed PSYCH: RASS -4 SKIN: pale Femoral cannulas in place, sites look okay Bilateral chest tubes in place, no tidaling or air leak, ongoing serosanguinous output Foley in place dark yellow urine  CXR pending CMP looks good Sugars look good LDH stable in 300s ABG with stable pO2 at 54 Heparin 0.26 (goal 0.3-0.5) Hgb slightly decreased (1 unit transfusing), plts improved Lactate 1.1, stable  Flows down a bit due to pven issues, partial resolution with volume  Resolved Hospital Problem list    Assessment & Plan:    Acute hypoxemic respiratory failure with ARDS secondary to COVID-19, s/p tracheostomy requiring mechanical ventilation and VV ECMO support.  COVID has been treated with all evidence based therapies, we are dealing with lingering severe diffuse alveolar damage.  -Continue ECMO at current flow rate.  -Bronchoscopy for secretion/clot clearance as needed -Continue to follow Vt on PC. Keep Driving pressure 15 of less. -Intermittent diuresis, hold today -Continues to desaturate and chug with minimal stimulation.  Continue current level enteral sedation. -Versed/precedex/dilaudid gtts with orals as written, keep heavily sedated until TV improve, gabapentin increased today, trying to avoid  paralytic drips -Premedicate with Dilaudid prior to turns. -Heparin with 0.3-0.5 goal  - After lengthy discussions we will place an additional SVC drain on Monday to increase  fractional circuit flow - Still awaiting improvement, still in single organ failure  MSSA and serratia marcescens pneumonia:  cefepime and vancomycin completed for 10 days Still has serratia on BAL but no signs of infection Ceftriaxone thereafter with duration TBD for TSS ppx while nasal packing in place  Bilateral pneumothoraces and pneumomediastinum No leak today Continue suction  -20cmH2O  Posterior epistaxis.  Repacked multiple times  Next due for repacking 1/20   Daily Goals Checklist  Pain/Anxiety/Delirium protocol (if indicated): see discussion above VAP protocol (if indicated): Bundle in place. Respiratory support goals: Continue PCV with ultra-lung protective ventilation  Blood pressure target:  MAP>65  DVT prophylaxis: on systemic anticoagulation on VV-ECMO -  Nutrition Status: Nutrition Problem: Increased nutrient needs Etiology: acute illness(COVID PNA) Signs/Symptoms: estimated needs Interventions: Tube feeding, Prostat GI prophylaxis: Protonix per tube. Fluid status goals: even if allowed by pven Urinary catheter: Guide hemodynamic management Glucose control: euglycemic on SSI. Mobility/therapy needs: Bedrest.  Daily labs: as per ECMO protocol. Code Status: Full code  Family Communication: will try to reach out today Disposition: ICU   Labs   CBC: Recent Labs  Lab 08/20/19 0444 08/20/19 0834 08/21/19 0203 08/21/19 0510 08/22/19 0336 08/22/19 0347 08/23/19 0335 08/23/19 0346 08/23/19 1018 08/23/19 1156 08/23/19 1622 08/23/19 1933 08/23/19 1942 08/24/19 0352 08/24/19 0401  WBC 5.6   < > 7.8   < > 5.0   < > 4.7  --  5.4  --  5.3 4.8  --  4.6  --   NEUTROABS 1.9  --  4.9  --  2.2  --  2.2  --   --   --   --   --   --  1.8  --   HGB 9.5*   < > 10.0*   < > 8.5*   < > 7.9*   < > 8.3*   < > 8.0* 7.9* 7.8* 8.3* 8.5*  HCT 31.7*   < > 33.7*   < > 28.0*   < > 26.8*   < > 28.6*   < > 27.7* 26.3* 23.0* 28.2* 25.0*  MCV 98.4   < > 97.7   < > 97.2   < >  98.5  --  99.3  --  97.5 96.3  --  97.6  --   PLT 85*   < > 89*   < > 84*   < > 91*  --  102*  --  96* 95*  --  91*  --    < > = values in this interval not displayed.    Basic Metabolic Panel: Recent Labs  Lab 08/22/19 0336 08/22/19 0347 08/22/19 1736 08/22/19 2055 08/23/19 0335 08/23/19 0346 08/23/19 1338 08/23/19 1622 08/23/19 1942 08/24/19 0352 08/24/19 0401  NA 144   < > 144   < > 142   < > 145 145 144 144 147*  K 4.1   < > 4.3   < > 4.4   < > 4.2 4.4 4.1 3.9 3.9  CL 110  --  111  --  109  --   --  111  --  109  --   CO2 25  --  26  --  25  --   --  25  --  24  --   GLUCOSE 160*  --  126*  --  152*  --   --  192*  --  176*  --   BUN 37*  --  40*  --  45*  --   --  42*  --  38*  --   CREATININE 0.44*  --  0.48*  --  0.41*  --   --  0.56*  --  0.35*  --   CALCIUM 8.9  --  9.0  --  9.1  --   --  9.2  --  9.0  --   MG  --   --   --   --   --   --   --   --   --  1.8  --    < > = values in this interval not displayed.   GFR: Estimated Creatinine Clearance: 161.3 mL/min (A) (by C-G formula based on SCr of 0.35 mg/dL (L)). Recent Labs  Lab 08/21/19 0203 08/21/19 1621 08/22/19 0336 08/22/19 1736 08/23/19 0335 08/23/19 0335 08/23/19 1018 08/23/19 1622 08/23/19 1933 08/24/19 0352  WBC 7.8   < > 5.0   < > 4.7   < > 5.4 5.3 4.8 4.6  LATICACIDVEN 1.2  --  1.0  --  1.0  --   --   --   --  1.1   < > = values in this interval not displayed.    Liver Function Tests: Recent Labs  Lab 08/20/19 0444 08/21/19 0203 08/22/19 0336 08/23/19 0335 08/24/19 0352  AST 13* _0 ALT _1 ALKPHOS 46 50 46 52 54  BILITOT 0.7 0.8 0.8 0.8 0.8  PROT 5.6* 5.9* 6.0* 6.1* 5.9*  ALBUMIN 2.6* 2.8* 3.2* 3.0* 2.6*   No results for input(s): LIPASE, AMYLASE in the last 168 hours. No results for input(s): AMMONIA in the last 168 hours.  ABG    Component Value Date/Time   PHART 7.384 08/24/2019 0401   PCO2ART 43.8 08/24/2019 0401   PO2ART 54.0 (L) 08/24/2019 0401    HCO3 26.3 08/24/2019 0401   TCO2 28 08/24/2019 0401   ACIDBASEDEF 1.0 08/23/2019 1156   O2SAT 88.0 08/24/2019 0401     Coagulation Profile: Recent Labs  Lab 08/20/19 0444 08/21/19 0203 08/22/19 0336 08/23/19 0335 08/24/19 0352  INR 1.2 1.1 1.2 1.3* 1.3*    Cardiac Enzymes: No results for input(s): CKTOTAL, CKMB, CKMBINDEX, TROPONINI in the last 168 hours.  HbA1C: Hgb A1c MFr Bld  Date/Time Value Ref Range Status  07/24/2019 04:50 AM 6.7 (H) 4.8 - 5.6 % Final    Comment:    (NOTE) Pre diabetes:          5.7%-6.4% Diabetes:              >6.4% Glycemic control for   <7.0% adults with diabetes   09/30/2017 05:36 AM 5.7 (H) 4.8 - 5.6 % Final    Comment:    (NOTE)         Prediabetes: 5.7 - 6.4         Diabetes: >6.4         Glycemic control for adults with diabetes: <7.0     CBG: Recent Labs  Lab 08/23/19 1156 08/23/19 1608 08/23/19 1941 08/23/19 2333 08/24/19 0324  GLUCAP 150* 177* 158* 163* 156*     Critical care time:     CRITICAL CARE   The patient is critically ill with multiple organ systems failure and requires high complexity decision making  for assessment and support, frequent evaluation and titration of therapies, application of advanced monitoring technologies and extensive interpretation of multiple databases. Critical Care Time devoted to patient care services described in this note independent of APP/resident time (if applicable)  is 35 minutes.   Erskine Emery MD [facility] 08/24/2019 7:00 AM Personal pager: [phone] If unanswered, please page CCM On-call: [phone]    08/24/2019, 7:00 AM

## 2019-08-24 NOTE — Progress Notes (Signed)
Patient's BIS increased from mid 30's to 60's. O2 sats 85-87%. BP and HR also slightly more elevated. Increased precedex, versed, and dilaudid drip rates. Bolused with versed and dilaudid. Dr. Tamala Julian notified and came to evaluate patient. No new orders. Will continue to increase sedation to improve BIS.  Joellen Jersey, RN

## 2019-08-24 NOTE — Progress Notes (Signed)
ANTICOAGULATION CONSULT NOTE  Pharmacy Consult for Heparin Indication: ECMO  No Known Allergies  Patient Measurements: Height: 5' 8"  (172.7 cm) Weight: (!) 313 lb 0.9 oz (142 kg) IBW/kg (Calculated) : 68.4 Heparin Dosing Weight: 100.7 kg  Vital Signs: Temp: 97.8 F (36.6 C) (01/16 0804) Temp Source: Core (Comment) (01/16 0400) BP: 124/52 (01/16 1135) Pulse Rate: 73 (01/16 1230)  Labs: Recent Labs    08/22/19 0336 08/22/19 0347 08/23/19 0335 08/23/19 0346 08/23/19 1200 08/23/19 1622 08/23/19 1622 08/23/19 1700 08/23/19 1933 08/23/19 1942 08/24/19 0352 08/24/19 0401 08/24/19 0901 08/24/19 0901 08/24/19 1213 08/24/19 1215  HGB 8.5*   < > 7.9*   < >  --  8.0*   < >  --  7.9*   < > 8.3*   < > 7.8*   < > 7.7* 7.8*  HCT 28.0*   < > 26.8*   < >  --  27.7*   < >  --  26.3*   < > 28.2*   < > 23.0*  --  26.1* 23.0*  PLT 84*   < > 91*   < >  --  96*   < >  --  95*  --  91*  --   --   --  86*  --   APTT 121*   < > 83*  --   --   --   --   --   --   --  78*  --   --   --  83*  --   LABPROT 14.8  --  16.2*  --   --   --   --   --   --   --  16.1*  --   --   --   --   --   INR 1.2  --  1.3*  --   --   --   --   --   --   --  1.3*  --   --   --   --   --   HEPARINUNFRC 0.33   < > 0.28*   < >   < >  --   --  0.36  --   --  0.26*  --   --   --  0.28*  --   CREATININE 0.44*   < > 0.41*  --   --  0.56*  --   --   --   --  0.35*  --   --   --   --   --    < > = values in this interval not displayed.    Estimated Creatinine Clearance: 161.3 mL/min (A) (by C-G formula based on SCr of 0.35 mg/dL (L)).  Assessment: 44 yo male known COVID+ and ARDS VV ECMO. Heparin was restarted 1/5 (bivalirudin stopped as HIT test was negative).  Heparin level now low at 0.28, on 2400 units/hr. No s/sx of bleeding or infusion issues. Hgb 7.8, plt 86. Of note aptt is unchanged from this am, will follow heparin level for now.    Goal of Therapy:  Heparin level: 0.3-0.5 units/mL APTT goal: 66-85  sec Monitor platelets by anticoagulation protocol: Yes   Plan:  -Increase heparin to 2500 units/hr -Re-check heparin level/aPTT at [place] PharmD., [facility] Clinical Pharmacist 08/24/2019 1:55 PM

## 2019-08-24 NOTE — Progress Notes (Signed)
Patient ID: [name], male   DOB: 09-01-1975, 44 y.o.   MRN: [id number]    Advanced Heart Failure Rounding Note   Subjective:    Circuit changed and trach exchanged on 1/8.   Remains on VV ecmo with good flows.   Oxygen saturation 93-95% this morning.   ABG 7.39/43/71/94%  Co-ox 77%, lactate 1.1.   He got Lasix yesterday with I/Os even.  Very sensitive to Lasix.   CXR: Diffuse bilateral lung opacities c/w ARDS, unchanged. Personally reviewed  On vent 100% PEEP 20. PlatP 30, Bi-vent setting. Personally reviewed  He is on norepinephrine 1 this morning.   ECMO parameters Flow 5.3 RPM 3900 dP 30 Pressure -118 LDH 325  Objective:   Weight Range:  Vital Signs:   Temp:  [97.5 F (36.4 C)-98.1 F (36.7 C)] 97.7 F (36.5 C) (01/16 0700) Pulse Rate:  [45-115] 65 (01/16 0715) Resp:  [0-24] 0 (01/16 0715) BP: (118)/(52) 118/52 (01/15 2045) SpO2:  [76 %-99 %] 93 % (01/16 0715) Arterial Line BP: (97-174)/(42-72) 120/50 (01/16 0715) FiO2 (%):  [100 %] 100 % (01/16 0345) Weight:  [142 kg] 142 kg (01/16 0400) Last BM Date: 08/23/19  Weight change: Filed Weights   08/21/19 0500 08/22/19 0500 08/24/19 0400  Weight: 131.3 kg 133.3 kg (!) 142 kg    Intake/Output:   Intake/Output Summary (Last 24 hours) at 08/24/2019 0755 Last data filed at 08/24/2019 0700 Gross per 24 hour  Intake 3447.61 ml  Output 3415 ml  Net 32.61 ml     Physical Exam: General: Sedated on vent.  Neck: Thick, JVP difficult, no thyromegaly or thyroid nodule. Tracheostomy.  Lungs: Distant BS CV: Nondisplaced PMI.  Heart regular S1/S2, no S3/S4, no murmur.  2+ lower leg edema.  No carotid bruit.  Normal pedal pulses.  Abdomen: Soft, nontender, no hepatosplenomegaly, no distention.  Skin: Intact without lesions or rashes.  Extremities: No clubbing or cyanosis.  HEENT: Normal.   Telemetry: Sinus 80-90s Personally reviewed  Labs: Basic Metabolic Panel: Recent Labs  Lab 08/22/19 0336  08/22/19 0347 08/22/19 1736 08/22/19 2055 08/23/19 0335 08/23/19 0346 08/23/19 1338 08/23/19 1622 08/23/19 1942 08/24/19 0352 08/24/19 0401  NA 144   < > 144   < > 142   < > 145 145 144 144 147*  K 4.1   < > 4.3   < > 4.4   < > 4.2 4.4 4.1 3.9 3.9  CL 110  --  111  --  109  --   --  111  --  109  --   CO2 25  --  26  --  25  --   --  25  --  24  --   GLUCOSE 160*  --  126*  --  152*  --   --  192*  --  176*  --   BUN 37*  --  40*  --  45*  --   --  42*  --  38*  --   CREATININE 0.44*  --  0.48*  --  0.41*  --   --  0.56*  --  0.35*  --   CALCIUM 8.9   < > 9.0   < > 9.1  --   --  9.2  --  9.0  --   MG  --   --   --   --   --   --   --   --   --  1.8  --    < > =  values in this interval not displayed.    Liver Function Tests: Recent Labs  Lab 08/20/19 0444 08/21/19 0203 08/22/19 0336 08/23/19 0335 08/24/19 0352  AST 13* _0 ALT _1 ALKPHOS 46 50 46 52 54  BILITOT 0.7 0.8 0.8 0.8 0.8  PROT 5.6* 5.9* 6.0* 6.1* 5.9*  ALBUMIN 2.6* 2.8* 3.2* 3.0* 2.6*   No results for input(s): LIPASE, AMYLASE in the last 168 hours. No results for input(s): AMMONIA in the last 168 hours.  CBC: Recent Labs  Lab 08/20/19 0444 08/20/19 0834 08/21/19 0203 08/21/19 0510 08/22/19 0336 08/22/19 0347 08/23/19 0335 08/23/19 0346 08/23/19 1018 08/23/19 1156 08/23/19 1622 08/23/19 1933 08/23/19 1942 08/24/19 0352 08/24/19 0401  WBC 5.6   < > 7.8   < > 5.0   < > 4.7  --  5.4  --  5.3 4.8  --  4.6  --   NEUTROABS 1.9  --  4.9  --  2.2  --  2.2  --   --   --   --   --   --  1.8  --   HGB 9.5*   < > 10.0*   < > 8.5*   < > 7.9*   < > 8.3*   < > 8.0* 7.9* 7.8* 8.3* 8.5*  HCT 31.7*   < > 33.7*   < > 28.0*   < > 26.8*   < > 28.6*   < > 27.7* 26.3* 23.0* 28.2* 25.0*  MCV 98.4   < > 97.7   < > 97.2   < > 98.5  --  99.3  --  97.5 96.3  --  97.6  --   PLT 85*   < > 89*   < > 84*   < > 91*  --  102*  --  96* 95*  --  91*  --    < > = values in this interval not displayed.     Cardiac Enzymes: No results for input(s): CKTOTAL, CKMB, CKMBINDEX, TROPONINI in the last 168 hours.  BNP: BNP (last 3 results) Recent Labs    07/22/19 1039  BNP 15.3    ProBNP (last 3 results) No results for input(s): PROBNP in the last 8760 hours.    Other results:  Imaging: DG CHEST PORT 1 VIEW  Result Date: 08/23/2019 CLINICAL DATA:  History of COVID.  ECMO. EXAM: PORTABLE CHEST 1 VIEW COMPARISON:  08/22/2019.  08/20/2019. FINDINGS: Tracheostomy tube and feeding tube in stable position. Right PICC line and bilateral chest tubes in stable position. Large bore cannula in stable position with tip projected over the IVC/right atrial junction. Heart size is difficult to evaluate. Complete opacification of both lungs again noted. No definite pleural effusion noted. No pneumothorax. IMPRESSION: 1. Lines and tubes including bilateral chest tubes in stable position. No pneumothorax. 2. Complete opacification of both lungs again noted. No interim change. Electronically Signed   By: Marcello Moores  Register   On: 08/23/2019 05:47   DG CHEST PORT 1 VIEW  Result Date: 08/22/2019 CLINICAL DATA:  Respiratory failure. EXAM: PORTABLE CHEST 1 VIEW COMPARISON:  Multiple previous chest x-rays. FINDINGS: Tracheostomy tube is in good position at the mid tracheal level. The feeding tube is coursing down the esophagus and into the stomach. Right PICC line tip is stable in the distal SVC. Bilateral chest tubes are stable.  No pneumothorax. Complete airspace consolidation of both lungs persists with air bronchograms. No obvious pleural  effusions. IMPRESSION: 1. Stable support apparatus. 2. Persistent diffuse airspace consolidation in both lungs. Electronically Signed   By: Marijo Sanes M.D.   On: 08/22/2019 12:20     Medications:     Scheduled Medications: . artificial tears  1 application Both Eyes Y7C  . vitamin C  500 mg Per Tube Daily  . chlorhexidine gluconate (MEDLINE KIT)  15 mL Mouth Rinse BID   . Chlorhexidine Gluconate Cloth  6 each Topical Daily  . diazepam  20 mg Per Tube Q6H  . feeding supplement (PRO-STAT SUGAR FREE 64)  60 mL Per Tube BID  . free water  300 mL Per Tube Q8H  . gabapentin  450 mg Per Tube Q8H  . HYDROmorphone  8 mg Per Tube Q4H  . insulin aspart  3-9 Units Subcutaneous Q4H  . mouth rinse  15 mL Mouth Rinse 10 times per day  . oxymetazoline  1 spray Each Nare BID  . pantoprazole sodium  40 mg Per Tube QHS  . QUEtiapine  200 mg Per Tube BID  . sodium chloride flush  10-40 mL Intracatheter Q12H  . sodium chloride flush  10-40 mL Intracatheter Q12H  . Thrombi-Pad  1 each Topical Once  . zinc sulfate  220 mg Per Tube Daily    Infusions: . sodium chloride    . sodium chloride 10 mL/hr at 08/23/19 2000  . albumin human    . albumin human 12.5 g (08/24/19 0509)  . cefTRIAXone (ROCEPHIN)  IV Stopped (08/23/19 1020)  . cisatracurium (NIMBEX) infusion 3 mcg/kg/min (08/24/19 0735)  . dexmedetomidine (PRECEDEX) IV infusion 1.3 mcg/kg/hr (08/24/19 0700)  . dextrose    . feeding supplement (PIVOT 1.5 CAL) 75 mL/hr at 08/24/19 0700  . heparin 2,400 Units/hr (08/24/19 0700)  . HYDROmorphone 14 mg/hr (08/24/19 0700)  . midazolam (VERSED) infusion 14 mg/hr (08/24/19 0700)  . norepinephrine (LEVOPHED) Adult infusion 1 mcg/min (08/24/19 0700)  . propofol (DIPRIVAN) infusion Stopped (08/21/19 1030)    PRN Medications: Place/Maintain arterial line **AND** sodium chloride, sodium chloride, acetaminophen (TYLENOL) oral liquid 160 mg/5 mL, acetaminophen, albumin human, dextrose, hydrALAZINE, HYDROmorphone, labetalol, magnesium hydroxide, metoprolol tartrate, midazolam, ondansetron (ZOFRAN) IV, senna-docusate, sodium chloride, sodium chloride flush, sodium chloride flush, sodium chloride flush   Assessment/Plan:    1. Acute hypoxic/hypercarbic respiratory failure due to COVID PNA: Remained markedly hypoxic despite full support. Intubated 12/26. S/p bilateral CTs 12/26  for pneumothoraces.  TEE on 12/26 LVEF 70% RV ok. VV ECMO begun 12/26. Tracheostomy 12/29.  COVID ARDS at this point.  Flows stable and sats improved with deeper sedation and re-paralysis. ECMO circuit changed 1/8 - ECMO parameters stable.  - Suspect significant recirculation/mixing.  Plan to add SVC drain on Monday. Oxygen saturation a bit better today, lactate normal think we can avoid arterial cannula.   - Main issue is bad lungs. Continue maneuvers to improve lung compliance - daily bronch with removal of clots, diuresis and standing position - Continues to have epistaxis with lung clots. Periodic bronchoscopy per CCM.  - Continue lung rest.  - LDH 325, stable - Lactate 1.1 - Suspect we can stop NE.   2. Bilateral PTX: Due to barotrauma.  - Driving pressure minimized at 12-13. Stable. No change  3. COVID PNA: management of COVID infection per CCM. CXR with bilateral multifocal PNA progressing to likely ARDS.  - s/p 10 days of remdesivir - 12/16, 12/2 s/p tociluzimab - 12/17 s/p convalescent plasma - Decadron at 59m/daily completed 1/11 - Completed cefepime/vanc -  On ceftriaxone for nasal packing  4. ID: Empiric coverage with vancomycin/cefepime initially for possible secondary bacterial PNA, course completed.   - treated with cefepime and vanc for + cx with staph epidimidis, MSSA and serratia (sensitivities ok). - Cover with ceftriaxone for nasal packing - Remains stable - D/w PharmD  5. Thrombocytopenia: Slow decrease in platelets, likely low level hemolysis + critical illness.  - Stable 91k this am.  - Back on heparin. Heparin dosing d/w PharmD  6. Anemia: Bleeding at trach site resolved but still with epistaxis - hgb 10.2 -> 9.2 -> 9.0 -> 9.5 ->9.2 -> 8.5 -> 7.9 -> 8.3 - Transfuse hgb < 8  7. FEN - continue TFs  D/w with ECMO team (ECMO coordinator/specialists, CCM, TCTS and pharmD) at bedside on multidisciplinary rounds. Continue to await ARDS recovery. Details as above.    Length of Stay: [place] Performed by: Loralie Champagne  Total critical care time: 40 minutes  Critical care time was exclusive of separately billable procedures and treating other patients.  Critical care was necessary to treat or prevent imminent or life-threatening deterioration.  Critical care was time spent personally by me on the following activities: development of treatment plan with patient and/or surrogate as well as nursing, discussions with consultants, evaluation of patient's response to treatment, examination of patient, obtaining history from patient or surrogate, ordering and performing treatments and interventions, ordering and review of laboratory studies, ordering and review of radiographic studies, pulse oximetry and re-evaluation of patient's condition.    Loralie Champagne MD 08/24/2019, 7:55 AM  Advanced Heart Failure Team Pager [phone] (M-F; 7a - 4p)  Please contact [facility] for night-coverage after hours (4p -7a ) and weekends on [email or web address]

## 2019-08-24 NOTE — Progress Notes (Signed)
ANTICOAGULATION CONSULT NOTE  Pharmacy Consult for Heparin Indication: ECMO  No Known Allergies  Patient Measurements: Height: 5' 8"  (172.7 cm) Weight: (!) 313 lb 0.9 oz (142 kg) IBW/kg (Calculated) : 68.4 Heparin Dosing Weight: 100.7 kg  Vital Signs: Temp: 97.7 F (36.5 C) (01/16 1945) Temp Source: Core (01/16 1945) BP: 139/48 (01/16 1939) Pulse Rate: 71 (01/16 2015)  Labs: Recent Labs    08/22/19 0336 08/22/19 0347 08/23/19 0335 08/23/19 0346 08/23/19 1622 08/23/19 1700 08/24/19 0352 08/24/19 0401 08/24/19 1213 08/24/19 1213 08/24/19 1215 08/24/19 1215 08/24/19 1805 08/24/19 1817 08/24/19 1947  HGB 8.5*   < > 7.9*   < > 8.0*   < > 8.3*   < > 7.7*   < > 7.8*   < > 8.4* 8.5*  --   HCT 28.0*   < > 26.8*   < > 27.7*   < > 28.2*   < > 26.1*   < > 23.0*  --  28.3* 25.0*  --   PLT 84*   < > 91*   < > 96*   < > 91*  --  86*  --   --   --  86*  --   --   APTT 121*   < > 83*  --   --   --  78*  --  83*  --   --   --   --   --   --   LABPROT 14.8  --  16.2*  --   --   --  16.1*  --   --   --   --   --   --   --   --   INR 1.2  --  1.3*  --   --   --  1.3*  --   --   --   --   --   --   --   --   HEPARINUNFRC 0.33   < > 0.28*   < >  --    < > 0.26*  --  0.28*  --   --   --   --   --  0.33  CREATININE 0.44*   < > 0.41*   < > 0.56*  --  0.35*  --   --   --   --   --  0.43*  --   --    < > = values in this interval not displayed.    Estimated Creatinine Clearance: 161.3 mL/min (A) (by C-G formula based on SCr of 0.43 mg/dL (L)).  Assessment: 44 yo male known COVID+ and ARDS VV ECMO. Heparin was restarted 1/5 (bivalirudin stopped as HIT test was negative).  Heparin level is therapeutic at 0.33, on 2500 units/hr. APTT came back at 111 (not correlating) - following heparin level. Hgb 8.5, plt 86. No s/sx of bleeding outside of slight oozing at nose.    Goal of Therapy:  Heparin level: 0.3-0.5 units/mL APTT goal: 66-85 sec Monitor platelets by anticoagulation protocol: Yes    Plan:  -Continue heparin at 2500 units/hr -Recheck heparin level at 0800 (in 12 hours) -Monitor CBC, and for s/sx of bleeding  Antonietta Jewel, PharmD, BCCCP Clinical Pharmacist  Phone: [phone]  Please check AMION for all [place] phone numbers After 10:00 PM, call [place] [phone] 08/24/2019 9:03 PM

## 2019-08-25 ENCOUNTER — Inpatient Hospital Stay (HOSPITAL_COMMUNITY): Payer: Managed Care, Other (non HMO)

## 2019-08-25 LAB — POCT I-STAT 7, (LYTES, BLD GAS, ICA,H+H)
Acid-Base Excess: 1 mmol/L (ref 0.0–2.0)
Acid-Base Excess: 2 mmol/L (ref 0.0–2.0)
Acid-base deficit: 1 mmol/L (ref 0.0–2.0)
Bicarbonate: 24.2 mmol/L (ref 20.0–28.0)
Bicarbonate: 26 mmol/L (ref 20.0–28.0)
Bicarbonate: 27 mmol/L (ref 20.0–28.0)
Bicarbonate: 25.1 mmol/L (ref 20.0–28.0)
Calcium, Ion: 1.37 mmol/L (ref 1.15–1.40)
Calcium, Ion: 1.35 mmol/L (ref 1.15–1.40)
Calcium, Ion: 1.38 mmol/L (ref 1.15–1.40)
Calcium, Ion: 1.36 mmol/L (ref 1.15–1.40)
HCT: 25 % — ABNORMAL LOW (ref 39.0–52.0)
HCT: 24 % — ABNORMAL LOW (ref 39.0–52.0)
HCT: 23 % — ABNORMAL LOW (ref 39.0–52.0)
HCT: 26 % — ABNORMAL LOW (ref 39.0–52.0)
Hemoglobin: 8.5 g/dL — ABNORMAL LOW (ref 13.0–17.0)
Hemoglobin: 8.2 g/dL — ABNORMAL LOW (ref 13.0–17.0)
Hemoglobin: 7.8 g/dL — ABNORMAL LOW (ref 13.0–17.0)
Hemoglobin: 8.8 g/dL — ABNORMAL LOW (ref 13.0–17.0)
O2 Saturation: 85 %
O2 Saturation: 87 %
O2 Saturation: 84 %
O2 Saturation: 87 %
Patient temperature: 36.3
Patient temperature: 36.4
Patient temperature: 36.41
Patient temperature: 36.9
Potassium: 4 mmol/L (ref 3.5–5.1)
Potassium: 3.9 mmol/L (ref 3.5–5.1)
Potassium: 4 mmol/L (ref 3.5–5.1)
Potassium: 3.9 mmol/L (ref 3.5–5.1)
Sodium: 150 mmol/L — ABNORMAL HIGH (ref 135–145)
Sodium: 149 mmol/L — ABNORMAL HIGH (ref 135–145)
Sodium: 149 mmol/L — ABNORMAL HIGH (ref 135–145)
Sodium: 150 mmol/L — ABNORMAL HIGH (ref 135–145)
TCO2: 25 mmol/L (ref 22–32)
TCO2: 27 mmol/L (ref 22–32)
TCO2: 28 mmol/L (ref 22–32)
TCO2: 26 mmol/L (ref 22–32)
pCO2 arterial: 40.2 mmHg (ref 32.0–48.0)
pCO2 arterial: 41.2 mmHg (ref 32.0–48.0)
pCO2 arterial: 45.1 mmHg (ref 32.0–48.0)
pCO2 arterial: 41.7 mmHg (ref 32.0–48.0)
pH, Arterial: 7.384 (ref 7.350–7.450)
pH, Arterial: 7.406 (ref 7.350–7.450)
pH, Arterial: 7.383 (ref 7.350–7.450)
pH, Arterial: 7.387 (ref 7.350–7.450)
pO2, Arterial: 49 mmHg — ABNORMAL LOW (ref 83.0–108.0)
pO2, Arterial: 52 mmHg — ABNORMAL LOW (ref 83.0–108.0)
pO2, Arterial: 49 mmHg — ABNORMAL LOW (ref 83.0–108.0)
pO2, Arterial: 54 mmHg — ABNORMAL LOW (ref 83.0–108.0)

## 2019-08-25 LAB — TYPE AND SCREEN
ABO/RH(D): O POS
Antibody Screen: NEGATIVE
Unit division: 0
Unit division: 0
Unit division: 0
Unit division: 0
Unit division: 0
Unit division: 0

## 2019-08-25 LAB — BPAM RBC
Blood Product Expiration Date: 202102042359
Blood Product Expiration Date: 202102042359
Blood Product Expiration Date: 202102102359
Blood Product Expiration Date: 202102102359
Blood Product Expiration Date: 202102122359
Blood Product Expiration Date: 202102122359
ISSUE DATE / TIME: 202101150644
ISSUE DATE / TIME: 202101152034
ISSUE DATE / TIME: 202101161501
Unit Type and Rh: 5100
Unit Type and Rh: 5100
Unit Type and Rh: 5100
Unit Type and Rh: 5100
Unit Type and Rh: 5100
Unit Type and Rh: 5100

## 2019-08-25 LAB — GLUCOSE, CAPILLARY
Glucose-Capillary: 130 mg/dL — ABNORMAL HIGH (ref 70–99)
Glucose-Capillary: 146 mg/dL — ABNORMAL HIGH (ref 70–99)
Glucose-Capillary: 148 mg/dL — ABNORMAL HIGH (ref 70–99)
Glucose-Capillary: 152 mg/dL — ABNORMAL HIGH (ref 70–99)
Glucose-Capillary: 159 mg/dL — ABNORMAL HIGH (ref 70–99)
Glucose-Capillary: 175 mg/dL — ABNORMAL HIGH (ref 70–99)

## 2019-08-25 LAB — COOXEMETRY PANEL
Carboxyhemoglobin: 3.8 % — ABNORMAL HIGH (ref 0.5–1.5)
Methemoglobin: 1.2 % (ref 0.0–1.5)
O2 Saturation: 74.8 %
Total hemoglobin: 8.3 g/dL — ABNORMAL LOW (ref 12.0–16.0)

## 2019-08-25 LAB — CBC WITH DIFFERENTIAL/PLATELET
Abs Immature Granulocytes: 0.2 10*3/uL — ABNORMAL HIGH (ref 0.00–0.07)
Band Neutrophils: 8 %
Basophils Absolute: 0 10*3/uL (ref 0.0–0.1)
Basophils Relative: 1 %
Eosinophils Absolute: 0.4 10*3/uL (ref 0.0–0.5)
Eosinophils Relative: 8 %
HCT: 27.2 % — ABNORMAL LOW (ref 39.0–52.0)
Hemoglobin: 8 g/dL — ABNORMAL LOW (ref 13.0–17.0)
Lymphocytes Relative: 35 %
Lymphs Abs: 1.6 10*3/uL (ref 0.7–4.0)
MCH: 28.9 pg (ref 26.0–34.0)
MCHC: 29.4 g/dL — ABNORMAL LOW (ref 30.0–36.0)
MCV: 98.2 fL (ref 80.0–100.0)
Metamyelocytes Relative: 4 %
Monocytes Absolute: 1.2 10*3/uL — ABNORMAL HIGH (ref 0.1–1.0)
Monocytes Relative: 27 %
Neutro Abs: 1.1 10*3/uL — ABNORMAL LOW (ref 1.7–7.7)
Neutrophils Relative %: 17 %
Platelets: 90 10*3/uL — ABNORMAL LOW (ref 150–400)
RBC: 2.77 MIL/uL — ABNORMAL LOW (ref 4.22–5.81)
RDW: 17.8 % — ABNORMAL HIGH (ref 11.5–15.5)
WBC: 4.5 10*3/uL (ref 4.0–10.5)
nRBC: 1 /100 WBC — ABNORMAL HIGH
nRBC: 2.9 % — ABNORMAL HIGH (ref 0.0–0.2)

## 2019-08-25 LAB — HEPARIN LEVEL (UNFRACTIONATED)
Heparin Unfractionated: 0.31 IU/mL (ref 0.30–0.70)
Heparin Unfractionated: 0.32 IU/mL (ref 0.30–0.70)

## 2019-08-25 LAB — BASIC METABOLIC PANEL
Anion gap: 10 (ref 5–15)
BUN: 39 mg/dL — ABNORMAL HIGH (ref 6–20)
CO2: 25 mmol/L (ref 22–32)
Calcium: 9.3 mg/dL (ref 8.9–10.3)
Chloride: 113 mmol/L — ABNORMAL HIGH (ref 98–111)
Creatinine, Ser: 0.43 mg/dL — ABNORMAL LOW (ref 0.61–1.24)
GFR calc Af Amer: >60 mL/min (ref >60)
GFR calc non Af Amer: >60 mL/min (ref >60)
Glucose, Bld: 155 mg/dL — ABNORMAL HIGH (ref 70–99)
Potassium: 4.1 mmol/L (ref 3.5–5.1)
Sodium: 148 mmol/L — ABNORMAL HIGH (ref 135–145)

## 2019-08-25 LAB — LACTIC ACID, PLASMA: Lactic Acid, Venous: 1.1 mmol/L (ref 0.5–1.9)

## 2019-08-25 LAB — MAGNESIUM: Magnesium: 2 mg/dL (ref 1.7–2.4)

## 2019-08-25 LAB — APTT
aPTT: 95 seconds — ABNORMAL HIGH (ref 24–36)
aPTT: 114 seconds — ABNORMAL HIGH (ref 24–36)

## 2019-08-25 MED ORDER — FUROSEMIDE 10 MG/ML IJ SOLN
20.0000 mg | Freq: Once | INTRAMUSCULAR | Status: AC
  Administered 2019-08-25: 20 mg via INTRAVENOUS
  Filled 2019-08-25: qty 2

## 2019-08-25 MED ORDER — POTASSIUM CHLORIDE 10 MEQ/50ML IV SOLN
10.0000 meq | INTRAVENOUS | Status: AC
  Administered 2019-08-25 (×2): 10 meq via INTRAVENOUS
  Filled 2019-08-25 (×2): qty 50

## 2019-08-25 MED ORDER — MONTELUKAST SODIUM 10 MG PO TABS
10.0000 mg | ORAL_TABLET | Freq: Every day | ORAL | Status: DC
  Administered 2019-08-25 – 2019-08-26 (×2): 10 mg via ORAL
  Filled 2019-08-25 (×2): qty 1

## 2019-08-25 MED ORDER — IPRATROPIUM-ALBUTEROL 0.5-2.5 (3) MG/3ML IN SOLN
3.0000 mL | Freq: Four times a day (QID) | RESPIRATORY_TRACT | Status: DC
  Administered 2019-08-25 – 2019-08-28 (×10): 3 mL via RESPIRATORY_TRACT
  Filled 2019-08-25 (×12): qty 3

## 2019-08-25 NOTE — Progress Notes (Addendum)
ANTICOAGULATION CONSULT NOTE  Pharmacy Consult for Heparin Indication: ECMO  No Known Allergies  Patient Measurements: Height: 5' 8"  (172.7 cm) Weight: (!) 318 lb 9 oz (144.5 kg) IBW/kg (Calculated) : 68.4 Heparin Dosing Weight: 100.7 kg  Vital Signs: Temp: 97.3 F (36.3 C) (01/17 0400) Temp Source: Core (01/17 0300) BP: 131/62 (01/17 0805) Pulse Rate: 78 (01/17 0715)  Labs: Recent Labs    08/23/19 0335 08/23/19 0346 08/24/19 0352 08/24/19 0401 08/24/19 1213 08/24/19 1215 08/24/19 1805 08/24/19 1817 08/24/19 1947 08/24/19 2349 08/24/19 2349 08/25/19 0445 08/25/19 0452 08/25/19 0752  HGB 7.9*   < > 8.3*   < > 7.7*   < > 8.4*   < >  --  8.8*   < > 8.0* 8.5*  --   HCT 26.8*   < > 28.2*   < > 26.1*   < > 28.3*   < >  --  26.0*  --  27.2* 25.0*  --   PLT 91*   < > 91*   < > 86*  --  86*  --   --   --   --  90*  --   --   APTT 83*   < > 78*   < > 83*  --   --   --  111*  --   --   --   --  95*  LABPROT 16.2*  --  16.1*  --   --   --   --   --   --   --   --   --   --   --   INR 1.3*  --  1.3*  --   --   --   --   --   --   --   --   --   --   --   HEPARINUNFRC 0.28*   < > 0.26*   < > 0.28*  --   --   --  0.33  --   --   --   --  0.31  CREATININE 0.41*   < > 0.35*  --   --   --  0.43*  --   --   --   --  0.43*  --   --    < > = values in this interval not displayed.    Estimated Creatinine Clearance: 163 mL/min (A) (by C-G formula based on SCr of 0.43 mg/dL (L)).  Assessment: 44 yo male known COVID+ and ARDS VV ECMO. Heparin was restarted 1/5 (bivalirudin stopped as HIT test was negative).  Heparin level is therapeutic at 0.31, on 2500 units/hr but trending down and at lower end of goal. APTT came back at 95 (not correlating) - following heparin level. Hgb 8.5, plt 90. No s/sx of bleeding outside of slight oozing at nose, nasal packing in place.    Goal of Therapy:  Heparin level: 0.3-0.5 units/mL APTT goal: 66-85 sec Monitor platelets by anticoagulation protocol:  Yes   Plan:  -Increase heparin to 2600 units/hr -Recheck heparin level q12 hours 5a/5p -Monitor CBC, and for s/sx of bleeding  Erin Hearing PharmD., BCPS Clinical Pharmacist 08/25/2019 8:27 AM   Please check AMION for all [place] phone numbers After 10:00 PM, call [place] [phone] 08/25/2019 8:25 AM

## 2019-08-25 NOTE — Progress Notes (Signed)
NAME:  [name], MRN:  [id number], DOB:  1975-10-16, LOS: 65 ADMISSION DATE:  07/22/2019, CONSULTATION DATE:  12/24 REFERRING MD:  Sloan Leiter, CHIEF COMPLAINT:  Dyspnea   Brief History   44 y/o M admitted 12/14 with COVID pneumonia causing acute hypoxemic respiratory failure. Developed pneumomediastinum 12/23 with concern for possible bilateral small pneumothoraces on 12/24.  PCCM consulted for evaluation of pneumothoraces  Past Medical History  GERD HTN Asthma  Significant Hospital Events   12/14 admit with hypoxemic respiratory failure in setting of COVID-19 pneumonia 12/23 noted to have pneumomediastinum on chest x-ray 12/24 PCCM consulted for evaluation 12/26 worsening hypoxemia, intubated  12/27 VV fem/fem ECMO cannulation See bedside nursing event notes for full rundown of procedures after 12/27   Consults:  PCCM, CTS, CHF, Palliative  Procedures:  See bedside nursing event notes for full rundown of procedures.  Significant Diagnostic Tests:  CT chest 12/22 >> extensive pneumomediastinum, multifocal patchy bilateral groundglass opacities  Micro Data:  BCx2 12/14 >> negative  Sputum 12/15 >> negative  1/9 bronch: serratia and MSSA 1/2 acid fast smear bronch: negative 1/2 fungal cx bronch: pending 1/12 BAL: serratia  Antimicrobials:  Received remdesivir, steroids, actemra and convalescent plasma  5 days cefepime 12/24-12/29 Cefepime 1/4->1/14 vanc 1/4->1/14 Ceftriaxone 1/14 >>   Interim history/subjective:  Still responds well to reverse trendelenburg.  Able to put at 40 degrees yesterday which raised sats to 90s.  Objective   Blood pressure 131/62, pulse 78, temperature (!) 97.3 F (36.3 C), resp. rate 15, height _0  (1.727 m), weight (!) 144.5 kg, SpO2 (!) 85 %. CVP:  [17 mmHg-88 mmHg] 88 mmHg  Vent Mode: Bi-Vent FiO2 (%):  [100 %] 100 % PEEP:  [20 cmH20] 20 cmH20 Plateau Pressure:  [20 cmH20-61 cmH20] 20 cmH20   Intake/Output Summary (Last 24  hours) at 08/25/2019 0834 Last data filed at 08/25/2019 0700 Gross per 24 hour  Intake 6068.36 ml  Output 2455 ml  Net 3613.36 ml   Filed Weights   08/22/19 0500 08/24/19 0400 08/25/19 0600  Weight: 133.3 kg (!) 142 kg (!) 144.5 kg   Examination GEN: obese ill man on vent HEENT: trach in place, small bloody sedations, nasal packing in place bilaterally, now more clear serous drainage from nose, small amount of blood in oropharynx CV: RRR, ext warm PULM: better air movement today, passive on vent GI: Soft, + BS EXT: Global anascarca NEURO: sedated, paralyzed PSYCH: RASS -4 SKIN: pale Femoral cannulas in place, sites look okay Bilateral chest tubes in place, no tidaling or air leak, ongoing serosanguinous output Foley in place dark yellow urine  CXR shows all segmental bronchi open, diffuse airspace disease CMP looks good Sugars look good LDH stable in 300s Fibrinogen 694, stable ABG good gas exchange but pAO2 low Heparin 0.31 (goal 0.3-0.5) HgB 8.5 stable, plts improved to 90 Lactate 1.1, stable  Flows around 5  Resolved Hospital Problem list    Assessment & Plan:    Acute hypoxemic respiratory failure with ARDS secondary to COVID-19, s/p tracheostomy requiring mechanical ventilation and VV ECMO support.  COVID has been treated with all evidence based therapies, we are dealing with lingering severe diffuse alveolar damage.  -Continue ECMO at current flow rate.  -Bronchoscopy for secretion/clot clearance as needed -Continue to follow Vt on PC. Keep Driving pressure 15 of less. -Intermittent diuresis, lasix today -Continues to desaturate and chug with minimal stimulation.  Continue current level enteral sedation. -Versed/precedex/dilaudid gtts with orals as written, keep heavily  sedated until TV improve, gabapentin increased today, will try to wean paralytic drip once extra cannula in -Premedicate with Dilaudid prior to turns. -Heparin with 0.3-0.5 goal  - After lengthy  discussions we will place an additional SVC drain on Monday to increase fractional circuit flow - Lung compliance improved today with PC challenge, CXR looks slightly better, I am hopeful the extra drain cannula will give [place] some more room to tinker with vent and sedation  MSSA and serratia marcescens pneumonia:  cefepime and vancomycin completed for 10 days Still has serratia on BAL but no signs of infection Ceftriaxone with duration TBD for TSS ppx while nasal packing in place  Bilateral pneumothoraces and pneumomediastinum No leak today Continue suction  -20cmH2O  Posterior epistaxis.  Repacked multiple times  Next due for repacking 1/20, will do q5 days after discussion with ENT   Daily Goals Checklist  Pain/Anxiety/Delirium protocol (if indicated): see discussion above VAP protocol (if indicated): Bundle in place. Respiratory support goals: Continue PCV with ultra-lung protective ventilation  Blood pressure target:  MAP>65  DVT prophylaxis: on systemic anticoagulation on VV-ECMO -  Nutrition Status: Nutrition Problem: Increased nutrient needs Etiology: acute illness(COVID PNA) Signs/Symptoms: estimated needs Interventions: Tube feeding, Prostat GI prophylaxis: Protonix per tube. Fluid status goals: even if allowed by pven Urinary catheter: Guide hemodynamic management Glucose control: euglycemic on SSI. Mobility/therapy needs: Bedrest.  Daily labs: as per ECMO protocol. Code Status: Full code  Family Communication: will try to reach out today Disposition: ICU   Labs   CBC: Recent Labs  Lab 08/21/19 0203 08/21/19 0510 08/22/19 0336 08/22/19 0347 08/23/19 0335 08/23/19 0346 08/23/19 1933 08/23/19 1942 08/24/19 0352 08/24/19 0401 08/24/19 1213 08/24/19 1215 08/24/19 1805 08/24/19 1817 08/24/19 2349 08/25/19 0445 08/25/19 0452  WBC 7.8   < > 5.0   < > 4.7   < > 4.8  --  4.6  --  4.3  --  4.2  --   --  4.5  --   NEUTROABS 4.9  --  2.2  --  2.2  --   --    --  1.8  --   --   --   --   --   --  1.1*  --   HGB 10.0*   < > 8.5*   < > 7.9*   < > 7.9*   < > 8.3*   < > 7.7*   < > 8.4* 8.5* 8.8* 8.0* 8.5*  HCT 33.7*   < > 28.0*   < > 26.8*   < > 26.3*   < > 28.2*   < > 26.1*   < > 28.3* 25.0* 26.0* 27.2* 25.0*  MCV 97.7   < > 97.2   < > 98.5   < > 96.3  --  97.6  --  99.2  --  97.3  --   --  98.2  --   PLT 89*   < > 84*   < > 91*   < > 95*  --  91*  --  86*  --  86*  --   --  90*  --    < > = values in this interval not displayed.    Basic Metabolic Panel: Recent Labs  Lab 08/23/19 0335 08/23/19 0346 08/23/19 1622 08/23/19 1942 08/24/19 0352 08/24/19 0401 08/24/19 1805 08/24/19 1817 08/24/19 2349 08/25/19 0445 08/25/19 0452  NA 142   < > 145   < > 144   < >  143 148* 149* 148* 150*  K 4.4   < > 4.4   < > 3.9   < > 4.2 4.3 4.0 4.1 4.0  CL 109  --  111  --  109  --  111  --   --  113*  --   CO2 25  --  25  --  24  --  23  --   --  25  --   GLUCOSE 152*  --  192*  --  176*  --  158*  --   --  155*  --   BUN 45*  --  42*  --  38*  --  38*  --   --  39*  --   CREATININE 0.41*  --  0.56*  --  0.35*  --  0.43*  --   --  0.43*  --   CALCIUM 9.1  --  9.2  --  9.0  --  8.9  --   --  9.3  --   MG  --   --   --   --  1.8  --   --   --   --  2.0  --    < > = values in this interval not displayed.   GFR: Estimated Creatinine Clearance: 163 mL/min (A) (by C-G formula based on SCr of 0.43 mg/dL (L)). Recent Labs  Lab 08/22/19 0336 08/22/19 1736 08/23/19 0335 08/23/19 1018 08/24/19 0352 08/24/19 1213 08/24/19 1805 08/25/19 0445  WBC 5.0   < > 4.7   < > 4.6 4.3 4.2 4.5  LATICACIDVEN 1.0  --  1.0  --  1.1  --   --  1.1   < > = values in this interval not displayed.    Liver Function Tests: Recent Labs  Lab 08/20/19 0444 08/21/19 0203 08/22/19 0336 08/23/19 0335 08/24/19 0352  AST 13* _0 ALT _1 ALKPHOS 46 50 46 52 54  BILITOT 0.7 0.8 0.8 0.8 0.8  PROT 5.6* 5.9* 6.0* 6.1* 5.9*  ALBUMIN 2.6* 2.8* 3.2* 3.0* 2.6*    No results for input(s): LIPASE, AMYLASE in the last 168 hours. No results for input(s): AMMONIA in the last 168 hours.  ABG    Component Value Date/Time   PHART 7.384 08/25/2019 0452   PCO2ART 40.2 08/25/2019 0452   PO2ART 49.0 (L) 08/25/2019 0452   HCO3 24.2 08/25/2019 0452   TCO2 25 08/25/2019 0452   ACIDBASEDEF 1.0 08/25/2019 0452   O2SAT 74.8 08/25/2019 0630     Coagulation Profile: Recent Labs  Lab 08/20/19 0444 08/21/19 0203 08/22/19 0336 08/23/19 0335 08/24/19 0352  INR 1.2 1.1 1.2 1.3* 1.3*    Cardiac Enzymes: No results for input(s): CKTOTAL, CKMB, CKMBINDEX, TROPONINI in the last 168 hours.  HbA1C: Hgb A1c MFr Bld  Date/Time Value Ref Range Status  07/24/2019 04:50 AM 6.7 (H) 4.8 - 5.6 % Final    Comment:    (NOTE) Pre diabetes:          5.7%-6.4% Diabetes:              >6.4% Glycemic control for   <7.0% adults with diabetes   09/30/2017 05:36 AM 5.7 (H) 4.8 - 5.6 % Final    Comment:    (NOTE)         Prediabetes: 5.7 - 6.4         Diabetes: >6.4  Glycemic control for adults with diabetes: <7.0     CBG: Recent Labs  Lab 08/24/19 1526 08/24/19 1946 08/24/19 2346 08/25/19 0311 08/25/19 0752  GLUCAP 160* 142* 146* 159* 148*     Critical care time:     CRITICAL CARE   The patient is critically ill with multiple organ systems failure and requires high complexity decision making for assessment and support, frequent evaluation and titration of therapies, application of advanced monitoring technologies and extensive interpretation of multiple databases. Critical Care Time devoted to patient care services described in this note independent of APP/resident time (if applicable)  is 32 minutes.   Erskine Emery MD [facility] 08/25/2019 8:34 AM Personal pager: [phone] If unanswered, please page CCM On-call: [phone]    08/25/2019, 8:34 AM

## 2019-08-25 NOTE — Progress Notes (Signed)
Patient placed at 33 degree elevation stand with the bed without complications.

## 2019-08-25 NOTE — Progress Notes (Signed)
Called and spoke with wife, Geovany Trudo to obtain consent for the placement of ECMO venous cannula. Wife consented. Second nurse verified with her as well. Wife requesting to be called by MD before procedure is to take place. Will pass along in report. Consent placed on patients clipboard to be signed by MD and Anesthesia.

## 2019-08-25 NOTE — Progress Notes (Signed)
   Palliative Medicine Inpatient Follow Up Note   HPI: Per intensivist notes --> 44 y/o M admitted 12/14 with COVIDpneumonia causing acutehypoxemic respiratory failure. Developed pneumomediastinum 12/23 with concern for possible bilateral small pneumothoraces on 12/24. PCCM consulted for evaluation of pneumothoraces. ECMO initiated thereafter.   Today's Discussion (08/25/2019): Chart reviewed. Checked in with nursing and respiratory therapy team regarding how patient was progressing. Things appear to be status quo. Remains to have nasal bleeding. Has had some oxygenation deficits, plan for an additional drainage cannula to be placed in the upcoming week. Will receive lasix today. Team got patient to a more upright position yesterday in bed which he tolerated fairly.  Patients mother, [name] called to provide an update. She stated that she will be coming in today. All questions answered.   Will continue to communicate with family incrementally.   Time Spent: 15 Greater than 50% of the time was spent in counseling and coordination of care ______________________________________________________________________________________ [facility] Team Team Cell Phone: [phone] Please utilize secure chat with additional questions, if there is no response within 30 minutes please call the above phone number  Palliative Medicine Team providers are available by phone from 7am to 7pm daily and can be reached through the team cell phone.  Should this patient require assistance outside of these hours, please call the patient's attending physician.

## 2019-08-25 NOTE — Progress Notes (Signed)
Patient placed back in rest position, P venous pressure increased, low flow alarm..  RPM's dropped and slowly increased to 3760, flow 5.17, Pven -116, delta P 32.  RN started 1 dose of Albumin.

## 2019-08-25 NOTE — Progress Notes (Signed)
ANTICOAGULATION CONSULT NOTE  Pharmacy Consult for Heparin Indication: ECMO  No Known Allergies  Patient Measurements: Height: 5' 8"  (172.7 cm) Weight: (!) 318 lb 9 oz (144.5 kg) IBW/kg (Calculated) : 68.4 Heparin Dosing Weight: 100.7 kg  Vital Signs: Temp: 97.3 F (36.3 C) (01/17 1800) Temp Source: Core (01/17 1800) BP: 141/58 (01/17 1145) Pulse Rate: 78 (01/17 1900)  Labs: Recent Labs    08/23/19 0335 08/23/19 0346 08/24/19 0352 08/24/19 0401 08/24/19 1213 08/24/19 1215 08/24/19 1805 08/24/19 1817 08/24/19 1947 08/24/19 2349 08/25/19 0445 08/25/19 0445 08/25/19 0452 08/25/19 0452 08/25/19 0752 08/25/19 0903 08/25/19 1101 08/25/19 1703  HGB 7.9*   < > 8.3*   < > 7.7*   < > 8.4*   < >  --    < > 8.0*   < > 8.5*   < >  --  8.2* 7.8*  --   HCT 26.8*   < > 28.2*   < > 26.1*   < > 28.3*   < >  --    < > 27.2*   < > 25.0*  --   --  24.0* 23.0*  --   PLT 91*   < > 91*   < > 86*  --  86*  --   --   --  90*  --   --   --   --   --   --   --   APTT 83*   < > 78*   < > 83*   < >  --   --  111*  --   --   --   --   --  95*  --   --  114*  LABPROT 16.2*  --  16.1*  --   --   --   --   --   --   --   --   --   --   --   --   --   --   --   INR 1.3*  --  1.3*  --   --   --   --   --   --   --   --   --   --   --   --   --   --   --   HEPARINUNFRC 0.28*   < > 0.26*   < > 0.28*   < >  --   --  0.33  --   --   --   --   --  0.31  --   --  0.32  CREATININE 0.41*   < > 0.35*  --   --   --  0.43*  --   --   --  0.43*  --   --   --   --   --   --   --    < > = values in this interval not displayed.    Estimated Creatinine Clearance: 163 mL/min (A) (by C-G formula based on SCr of 0.43 mg/dL (L)).  Assessment: 44 yo male known COVID+ and ARDS VV ECMO. Heparin was restarted 1/5 (bivalirudin stopped as HIT test was negative).  Heparin level is therapeutic at 0.32, on 2600 units/hr. APTT came back at 114 (not correlating) - following heparin level. Hgb 7.8, plt 90. No s/sx of bleeding  outside of oozing at nose, nasal packing remains in place.    Goal of Therapy:  Heparin level: 0.3-0.5 units/mL APTT goal: 66-85  sec Monitor platelets by anticoagulation protocol: Yes   Plan:  -Continue heparin to 2600 units/hr -Recheck heparin level q12 hours 5a/5p -Monitor CBC, and for s/sx of bleeding  Antonietta Jewel, PharmD, BCCCP Clinical Pharmacist  Phone: [phone]  Please check AMION for all [place] phone numbers After 10:00 PM, call [place] [phone] 08/25/2019 7:18 PM

## 2019-08-25 NOTE — Progress Notes (Signed)
Patient ID: [name], male   DOB: 09-21-1975, 44 y.o.   MRN: [id number]    Advanced Heart Failure Rounding Note   Subjective:    Circuit changed and trach exchanged on 1/8.   Remains on VV ecmo with good flow but oxygen saturation remains marginal, 85-86%.  Weight is up 5 lbs.   ABG 7.38/40/49  Co-ox 75%, lactate 1.1.   CXR: Diffuse bilateral lung opacities c/w ARDS, unchanged. Personally reviewed  On vent 100% PEEP 20. PlatP 30, Bi-vent setting. Personally reviewed  He is on norepinephrine 1 this morning.   Still with ENT bleeding.  Nasal packing in place.   Drips: Norepinephrine 3 Precedex Dilaudid Versed Nimbex  ECMO parameters Flow 5.2 RPM 3800 dP 32 Pressure -115  Objective:   Weight Range:  Vital Signs:   Temp:  [97.3 F (36.3 C)-97.9 F (36.6 C)] 97.3 F (36.3 C) (01/17 0400) Pulse Rate:  [44-95] 78 (01/17 0715) Resp:  [0-15] 0 (01/17 0715) BP: (124-143)/(48-60) 130/60 (01/17 0318) SpO2:  [71 %-97 %] 85 % (01/17 0715) Arterial Line BP: (99-180)/(42-72) 126/59 (01/17 0715) FiO2 (%):  [100 %] 100 % (01/17 0400) Weight:  [144.5 kg] 144.5 kg (01/17 0600) Last BM Date: 08/24/19  Weight change: Filed Weights   08/22/19 0500 08/24/19 0400 08/25/19 0600  Weight: 133.3 kg (!) 142 kg (!) 144.5 kg    Intake/Output:   Intake/Output Summary (Last 24 hours) at 08/25/2019 0746 Last data filed at 08/25/2019 0700 Gross per 24 hour  Intake 8019.53 ml  Output 2655 ml  Net 5364.53 ml     Physical Exam: General: Sedated on vent.  Neck: Unable to assess JVP, trach present.   Lungs: Clear to auscultation bilaterally with normal respiratory effort. CV: Nondisplaced PMI.  Heart regular S1/S2, no S3/S4, no murmur.  1+ edema to thighs.   Abdomen: Soft, no hepatosplenomegaly, mild distention.  Skin: Intact without lesions or rashes.  Neurologic: Sedated. Extremities: No clubbing or cyanosis.  HEENT: Normal.   Telemetry: Sinus 80-90s Personally  reviewed  Labs: Basic Metabolic Panel: Recent Labs  Lab 08/23/19 0335 08/23/19 0346 08/23/19 1622 08/23/19 1942 08/24/19 0352 08/24/19 0401 08/24/19 1805 08/24/19 1817 08/24/19 2349 08/25/19 0445 08/25/19 0452  NA 142   < > 145   < > 144   < > 143 148* 149* 148* 150*  K 4.4   < > 4.4   < > 3.9   < > 4.2 4.3 4.0 4.1 4.0  CL 109  --  111  --  109  --  111  --   --  113*  --   CO2 25  --  25  --  24  --  23  --   --  25  --   GLUCOSE 152*  --  192*  --  176*  --  158*  --   --  155*  --   BUN 45*  --  42*  --  38*  --  38*  --   --  39*  --   CREATININE 0.41*  --  0.56*  --  0.35*  --  0.43*  --   --  0.43*  --   CALCIUM 9.1   < > 9.2   < > 9.0  --  8.9  --   --  9.3  --   MG  --   --   --   --  1.8  --   --   --   --  2.0  --    < > = values in this interval not displayed.    Liver Function Tests: Recent Labs  Lab 08/20/19 0444 08/21/19 0203 08/22/19 0336 08/23/19 0335 08/24/19 0352  AST 13* 17 15 18 19   ALT 21 22 20 23 27   ALKPHOS 46 50 46 52 54  BILITOT 0.7 0.8 0.8 0.8 0.8  PROT 5.6* 5.9* 6.0* 6.1* 5.9*  ALBUMIN 2.6* 2.8* 3.2* 3.0* 2.6*   No results for input(s): LIPASE, AMYLASE in the last 168 hours. No results for input(s): AMMONIA in the last 168 hours.  CBC: Recent Labs  Lab 08/21/19 0203 08/21/19 0510 08/22/19 0336 08/22/19 0347 08/23/19 0335 08/23/19 0346 08/23/19 1933 08/23/19 1942 08/24/19 0352 08/24/19 0401 08/24/19 1213 08/24/19 1215 08/24/19 1805 08/24/19 1817 08/24/19 2349 08/25/19 0445 08/25/19 0452  WBC 7.8   < > 5.0   < > 4.7   < > 4.8  --  4.6  --  4.3  --  4.2  --   --  4.5  --   NEUTROABS 4.9  --  2.2  --  2.2  --   --   --  1.8  --   --   --   --   --   --  1.1*  --   HGB 10.0*   < > 8.5*   < > 7.9*   < > 7.9*   < > 8.3*   < > 7.7*   < > 8.4* 8.5* 8.8* 8.0* 8.5*  HCT 33.7*   < > 28.0*   < > 26.8*   < > 26.3*   < > 28.2*   < > 26.1*   < > 28.3* 25.0* 26.0* 27.2* 25.0*  MCV 97.7   < > 97.2   < > 98.5   < > 96.3  --  97.6  --   99.2  --  97.3  --   --  98.2  --   PLT 89*   < > 84*   < > 91*   < > 95*  --  91*  --  86*  --  86*  --   --  90*  --    < > = values in this interval not displayed.    Cardiac Enzymes: No results for input(s): CKTOTAL, CKMB, CKMBINDEX, TROPONINI in the last 168 hours.  BNP: BNP (last 3 results) Recent Labs    07/22/19 1039  BNP 15.3    ProBNP (last 3 results) No results for input(s): PROBNP in the last 8760 hours.    Other results:  Imaging: DG CHEST PORT 1 VIEW  Result Date: 08/25/2019 CLINICAL DATA:  Acute respiratory failure with hypoxia. ECMO. EXAM: PORTABLE CHEST 1 VIEW COMPARISON:  One-view chest x-ray 08/24/2019 FINDINGS: Tracheostomy tube and bilateral chest tubes are stable. Right-sided PICC line is in place. Feeding tube courses off the inferior border of the film. The heart is enlarged. Diffuse interstitial and airspace disease is similar the prior study. IMPRESSION: 1. Stable appearance of diffuse interstitial and airspace disease compatible with ARDS. 2. Stable support apparatus. Electronically Signed   By: San Morelle M.D.   On: 08/25/2019 07:36   DG CHEST PORT 1 VIEW  Result Date: 08/24/2019 CLINICAL DATA:  Cardiovascular failure. EXAM: PORTABLE CHEST 1 VIEW COMPARISON:  One-view chest x-ray 08/23/2019 FINDINGS: Heart size is obscured by diffuse airspace disease. Bilateral chest tubes are in place. There is no pneumothorax. Tracheostomy tube is stable. Feeding tube courses off  the inferior border of film. Right-sided PICC line is stable. Aeration at the periphery of the lung fields is slightly improved. IMPRESSION: 1. Slightly improved aeration at the periphery of the lung fields bilaterally. 2. Diffuse interstitial and airspace opacities are again seen. 3. Support apparatus is stable. Electronically Signed   By: San Morelle M.D.   On: 08/24/2019 09:45     Medications:     Scheduled Medications: . artificial tears  1 application Both Eyes O5D   . vitamin C  500 mg Per Tube Daily  . chlorhexidine gluconate (MEDLINE KIT)  15 mL Mouth Rinse BID  . Chlorhexidine Gluconate Cloth  6 each Topical Daily  . diazepam  20 mg Per Tube Q6H  . feeding supplement (PRO-STAT SUGAR FREE 64)  60 mL Per Tube BID  . free water  300 mL Per Tube Q8H  . gabapentin  450 mg Per Tube Q8H  . HYDROmorphone  8 mg Per Tube Q4H  . insulin aspart  3-9 Units Subcutaneous Q4H  . mouth rinse  15 mL Mouth Rinse 10 times per day  . oxymetazoline  1 spray Each Nare BID  . pantoprazole sodium  40 mg Per Tube QHS  . QUEtiapine  200 mg Per Tube BID  . sodium chloride flush  10-40 mL Intracatheter Q12H  . sodium chloride flush  10-40 mL Intracatheter Q12H  . Thrombi-Pad  1 each Topical Once  . zinc sulfate  220 mg Per Tube Daily    Infusions: . sodium chloride    . sodium chloride 10 mL/hr at 08/25/19 0700  . sodium chloride 10 mL/hr at 08/25/19 0700  . albumin human Stopped (08/25/19 0510)  . cefTRIAXone (ROCEPHIN)  IV Stopped (08/24/19 1045)  . cisatracurium (NIMBEX) infusion 3 mcg/kg/min (08/25/19 0700)  . dexmedetomidine (PRECEDEX) IV infusion 1.6 mcg/kg/hr (08/25/19 0700)  . dextrose    . feeding supplement (PIVOT 1.5 CAL) 75 mL/hr at 08/25/19 0700  . heparin 2,500 Units/hr (08/25/19 0700)  . HYDROmorphone 20 mg/hr (08/25/19 0700)  . midazolam (VERSED) infusion 20 mg/hr (08/25/19 0700)  . norepinephrine (LEVOPHED) Adult infusion 3 mcg/min (08/25/19 0700)  . propofol (DIPRIVAN) infusion Stopped (08/21/19 1030)    PRN Medications: Place/Maintain arterial line **AND** sodium chloride, sodium chloride, sodium chloride, acetaminophen (TYLENOL) oral liquid 160 mg/5 mL, acetaminophen, albumin human, dextrose, hydrALAZINE, HYDROmorphone, labetalol, magnesium hydroxide, metoprolol tartrate, midazolam, ondansetron (ZOFRAN) IV, senna-docusate, sodium chloride, sodium chloride flush, sodium chloride flush, sodium chloride flush   Assessment/Plan:    1. Acute  hypoxic/hypercarbic respiratory failure due to COVID PNA: Remained markedly hypoxic despite full support. Intubated 12/26. S/p bilateral CTs 12/26 for pneumothoraces.  TEE on 12/26 LVEF 70% RV ok. VV ECMO begun 12/26. Tracheostomy 12/29.  COVID ARDS at this point.  ECMO circuit changed 1/8 - ECMO parameters stable.  - Suspect significant recirculation/mixing.  Oxygen saturation lo (85-86%), have maximized flow with current ECMO setup.  Plan to add SVC drain on Monday. Lactate normal.  - I/Os ++ yesterday, will give Lasix 20 mg IV x 1 today (responds very vigorously to Lasix). Hopefully will help oxygen saturation.  - Main issue is bad lungs. Continue maneuvers to improve lung compliance, suspect he will need bronch today with removal of clots.  - Continue lung rest.  - No LDH today, order.  - Suspect we can stop NE, SBP 130s.   2. Bilateral PTX: Due to barotrauma.  - Driving pressure minimized at 12-13. Stable. No change  3. COVID PNA: management of  COVID infection per CCM. CXR with bilateral multifocal PNA progressing to likely ARDS.  - s/p 10 days of remdesivir - 12/16, 12/2 s/p tociluzimab - 12/17 s/p convalescent plasma - Decadron at 88m/daily completed 1/11 - Completed cefepime/vanc - On ceftriaxone for nasal packing  4. ID: Empiric coverage with vancomycin/cefepime initially for possible secondary bacterial PNA, course completed.   - treated with cefepime and vanc for + cx with staph epidimidis, MSSA and serratia (sensitivities ok). - Cover with ceftriaxone for nasal packing  5. Thrombocytopenia: Slow decrease in platelets, likely low level hemolysis + critical illness.  - Stable 90k this am.  - Back on heparin per pharmacy.   6. Anemia: Bleeding at trach site resolved but still with epistaxis - hgb 10.2 -> 9.2 -> 9.0 -> 9.5 ->9.2 -> 8.5 -> 7.9 -> 8.3 -> 8.5.  - Transfuse hgb < 8  7. FEN - continue TFs  D/w with ECMO team (ECMO coordinator/specialists, CCM, TCTS and pharmD)  at bedside on multidisciplinary rounds. Continue to await ARDS recovery. Details as above.   Length of Stay: 34   CRITICAL CARE Performed by: DLoralie Champagne Total critical care time: 40 minutes  Critical care time was exclusive of separately billable procedures and treating other patients.  Critical care was necessary to treat or prevent imminent or life-threatening deterioration.  Critical care was time spent personally by me on the following activities: development of treatment plan with patient and/or surrogate as well as nursing, discussions with consultants, evaluation of patient's response to treatment, examination of patient, obtaining history from patient or surrogate, ordering and performing treatments and interventions, ordering and review of laboratory studies, ordering and review of radiographic studies, pulse oximetry and re-evaluation of patient's condition.    DLoralie ChampagneMD 08/25/2019, 7:46 AM  Advanced Heart Failure Team Pager [phone](M-F; 7a - 4p)  Please contact [facility] for night-coverage after hours (4p -7a ) and weekends on [email or web address]

## 2019-08-25 NOTE — Evaluation (Signed)
Physical Therapy Evaluation Patient Details Name: [name] MRN: [id number] DOB: 1976/04/02 Today's Date: 08/25/2019   History of Present Illness  44 y/o M admitted 12/14 with COVID pneumonia causing acute hypoxemic respiratory failure.   Developed pneumomediastinum 12/23 with concern for possible bilateral small pneumothoraces on 12/24. Pt intubated on 12/26 and started on ECMO on 12/27. Tracheostomy on 12/29 with bleeding from trach an dother sites on 12/31. Bronch on 1/11 and 1/15.  Clinical Impression  Pt presents to PT currently sedated and paralyzed on vent to trach and ECMO. PT following currently for verticalization therapy in tilt bed as well as for ROM. Pt with significant deficits in cardiopulmonary function at this time as well as strength and cognitive deficits due to current medications. Pt will benefit from acute PT POC to continue tilt therapy with intent of improving vital signs and reducing oxygen needs. Pt will also benefit from ROM to maintain functional ROM for when sedation and paralytics can be weaned. PT will re-evaluate patient for functional mobility goals when patient is able to follow commands and is off paralytics.    Follow Up Recommendations Other (comment)(to be determined)    Equipment Recommendations  Other (comment)(to be determined)    Recommendations for Other Services       Precautions / Restrictions Precautions Precautions: Fall;Other (comment) Precaution Comments: vent to trach, ecmo Restrictions Weight Bearing Restrictions: No      Mobility  Bed Mobility Overal bed mobility: (mobility deferred as pt sedated and paralyzed. Tilt function)                Transfers                 General transfer comment: pt passively standing in 30 degree tilt in Kreg bed. pt in this position for ~15 min upon PT arrival per nursing report and left in this position throughout and at end of session.  Ambulation/Gait                 Stairs            Wheelchair Mobility    Modified Rankin (Stroke Patients Only)       Balance                                             Pertinent Vitals/Pain Pain Assessment: Faces Pain Score: 0-No pain    Home Living Family/patient expects to be discharged to:: Private residence Living Arrangements: Spouse/significant other Available Help at Discharge: Family Type of Home: House Home Access: Stairs to enter Entrance Stairs-Rails: Psychiatric nurse of Steps: 4 Home Layout: One level Home Equipment: None Additional Comments: history obtained from chart as pt paralyzed and sedated    Prior Function Level of Independence: Independent         Comments: Pt independent in ADLs, IADLs, and mobility. Pt drives and works at [employer]. Pt does not use oxygen at home.     Hand Dominance   Dominant Hand: Right    Extremity/Trunk Assessment   Upper Extremity Assessment RUE Deficits / Details: finger, wrist, elbow ROM passively WFL. Shoulder ROM assessment limited due to lines and leads but with at least 90 degrees shoulder flexion and abductiona available passively LUE Deficits / Details: finger, wrist, elbow ROM passively WFL. Shoulder ROM assessment limited due to lines and leads but with at  least 90 degrees shoulder flexion and abductiona available passively    Lower Extremity Assessment Lower Extremity Assessment: (deferred as pt in 30 degree tilt to stand during session)    Cervical / Trunk Assessment Cervical / Trunk Assessment: Other exceptions Cervical / Trunk Exceptions: morbid obesity  Communication   Communication: Tracheostomy(vent to trach)  Cognition Arousal/Alertness: Suspect due to medications(sedated) Behavior During Therapy: Flat affect(sedated) Overall Cognitive Status: Impaired/Different from baseline                                 General Comments: pt sedated and paralyzed       General Comments General comments (skin integrity, edema, etc.): Pre-session vitals: HR-70, SpO2-01%, 120/47. Pt on Vent to trach with 20 PEEP and 100% FiO2. ECMO settings of 3800 RPM, 5.18 LPM. End of session vitals: HR-74, Sats-91%, 122/52, ECMO settings 3800 RPM 5.16 LPM. Pt saturation ranging from 85-100% with reliable pleth reading. Pleth reading fluctuating some with PROM of RUE due to pulse ox on this hand    Exercises General Exercises - Upper Extremity Shoulder Flexion: PROM;Right;Left;10 reps(limited to 90 degrees due to lines) Shoulder ABduction: PROM;Both;10 reps(limited to 90 degrees 2/2 lines) Elbow Flexion: PROM;Both;10 reps Elbow Extension: PROM;Both;10 reps Wrist Flexion: PROM;Both;10 reps Wrist Extension: PROM;Both;10 reps Digit Composite Flexion: PROM;Both;10 reps Composite Extension: PROM;Both;10 reps   Assessment/Plan    PT Assessment Patient needs continued PT services  PT Problem List Decreased strength;Decreased range of motion;Decreased activity tolerance;Decreased balance;Decreased mobility;Decreased cognition;Decreased knowledge of use of DME;Decreased safety awareness;Decreased knowledge of precautions;Cardiopulmonary status limiting activity;Obesity       PT Treatment Interventions Therapeutic exercise;Therapeutic activities    PT Goals (Current goals can be found in the Care Plan section)  Acute Rehab PT Goals Patient Stated Goal: pt unable to state, improve tolerance for upright position and reduce oxygen requirements while maintaining ROM PT Goal Formulation: Patient unable to participate in goal setting Time For Goal Achievement: 09/08/19 Potential to Achieve Goals: Fair Additional Goals Additional Goal #1: Pt will tolerate standing in TILT bed at 45 degrees for over one hour with stable or improved vital signs.    Frequency Min 1X/week(PT will follow once a week for continued TILTing and ROM)   Barriers to discharge        Co-evaluation                AM-PAC PT "6 Clicks" Mobility  Outcome Measure Help needed turning from your back to your side while in a flat bed without using bedrails?: Total Help needed moving from lying on your back to sitting on the side of a flat bed without using bedrails?: Total Help needed moving to and from a bed to a chair (including a wheelchair)?: Total Help needed standing up from a chair using your arms (e.g., wheelchair or bedside chair)?: Total Help needed to walk in hospital room?: Total Help needed climbing 3-5 steps with a railing? : Total 6 Click Score: 6    End of Session Equipment Utilized During Treatment: Oxygen Activity Tolerance: Patient tolerated treatment well Patient left: in bed Nurse Communication: Mobility status PT Visit Diagnosis: Difficulty in walking, not elsewhere classified (R26.2)    Time: 2458-0998 PT Time Calculation (min) (ACUTE ONLY): 19 min   Charges:   PT Evaluation $PT Eval High Complexity: 1 High          Zenaida Niece, PT, DPT Acute Rehabilitation Pager: [phone]  Zenaida Niece 08/25/2019, 5:26 PM

## 2019-08-25 NOTE — Progress Notes (Signed)
Called wife for update and told her of tentative plan for another drainage cannula this week.  Erskine Emery MD

## 2019-08-26 ENCOUNTER — Inpatient Hospital Stay (HOSPITAL_COMMUNITY): Payer: Managed Care, Other (non HMO)

## 2019-08-26 LAB — CBC
HCT: 28.2 % — ABNORMAL LOW (ref 39.0–52.0)
HCT: 26.4 % — ABNORMAL LOW (ref 39.0–52.0)
Hemoglobin: 8.3 g/dL — ABNORMAL LOW (ref 13.0–17.0)
Hemoglobin: 7.9 g/dL — ABNORMAL LOW (ref 13.0–17.0)
MCH: 28.7 pg (ref 26.0–34.0)
MCH: 28.9 pg (ref 26.0–34.0)
MCHC: 29.4 g/dL — ABNORMAL LOW (ref 30.0–36.0)
MCHC: 29.9 g/dL — ABNORMAL LOW (ref 30.0–36.0)
MCV: 97.6 fL (ref 80.0–100.0)
MCV: 96.7 fL (ref 80.0–100.0)
Platelets: 92 10*3/uL — ABNORMAL LOW (ref 150–400)
Platelets: 95 10*3/uL — ABNORMAL LOW (ref 150–400)
RBC: 2.89 MIL/uL — ABNORMAL LOW (ref 4.22–5.81)
RBC: 2.73 MIL/uL — ABNORMAL LOW (ref 4.22–5.81)
RDW: 18.1 % — ABNORMAL HIGH (ref 11.5–15.5)
RDW: 18.2 % — ABNORMAL HIGH (ref 11.5–15.5)
WBC: 5.1 10*3/uL (ref 4.0–10.5)
WBC: 6 10*3/uL (ref 4.0–10.5)
nRBC: 2.5 % — ABNORMAL HIGH (ref 0.0–0.2)
nRBC: 2.3 % — ABNORMAL HIGH (ref 0.0–0.2)

## 2019-08-26 LAB — POCT I-STAT 7, (LYTES, BLD GAS, ICA,H+H)
Acid-Base Excess: 1 mmol/L (ref 0.0–2.0)
Acid-Base Excess: 1 mmol/L (ref 0.0–2.0)
Bicarbonate: 25.8 mmol/L (ref 20.0–28.0)
Bicarbonate: 26.6 mmol/L (ref 20.0–28.0)
Bicarbonate: 26 mmol/L (ref 20.0–28.0)
Calcium, Ion: 1.35 mmol/L (ref 1.15–1.40)
Calcium, Ion: 1.36 mmol/L (ref 1.15–1.40)
Calcium, Ion: 1.34 mmol/L (ref 1.15–1.40)
HCT: 26 % — ABNORMAL LOW (ref 39.0–52.0)
HCT: 25 % — ABNORMAL LOW (ref 39.0–52.0)
HCT: 24 % — ABNORMAL LOW (ref 39.0–52.0)
Hemoglobin: 8.8 g/dL — ABNORMAL LOW (ref 13.0–17.0)
Hemoglobin: 8.5 g/dL — ABNORMAL LOW (ref 13.0–17.0)
Hemoglobin: 8.2 g/dL — ABNORMAL LOW (ref 13.0–17.0)
O2 Saturation: 87 %
O2 Saturation: 83 %
O2 Saturation: 88 %
Patient temperature: 36.9
Patient temperature: 36.6
Patient temperature: 36.4
Potassium: 4.5 mmol/L (ref 3.5–5.1)
Potassium: 4.2 mmol/L (ref 3.5–5.1)
Potassium: 4.2 mmol/L (ref 3.5–5.1)
Sodium: 148 mmol/L — ABNORMAL HIGH (ref 135–145)
Sodium: 150 mmol/L — ABNORMAL HIGH (ref 135–145)
Sodium: 148 mmol/L — ABNORMAL HIGH (ref 135–145)
TCO2: 27 mmol/L (ref 22–32)
TCO2: 28 mmol/L (ref 22–32)
TCO2: 27 mmol/L (ref 22–32)
pCO2 arterial: 43.1 mmHg (ref 32.0–48.0)
pCO2 arterial: 50 mmHg — ABNORMAL HIGH (ref 32.0–48.0)
pCO2 arterial: 40.6 mmHg (ref 32.0–48.0)
pH, Arterial: 7.384 (ref 7.350–7.450)
pH, Arterial: 7.332 — ABNORMAL LOW (ref 7.350–7.450)
pH, Arterial: 7.412 (ref 7.350–7.450)
pO2, Arterial: 54 mmHg — ABNORMAL LOW (ref 83.0–108.0)
pO2, Arterial: 50 mmHg — ABNORMAL LOW (ref 83.0–108.0)
pO2, Arterial: 52 mmHg — ABNORMAL LOW (ref 83.0–108.0)

## 2019-08-26 LAB — CBC WITH DIFFERENTIAL/PLATELET
Abs Immature Granulocytes: 0.86 10*3/uL — ABNORMAL HIGH (ref 0.00–0.07)
Basophils Absolute: 0.1 10*3/uL (ref 0.0–0.1)
Basophils Relative: 1 %
Eosinophils Absolute: 0.2 10*3/uL (ref 0.0–0.5)
Eosinophils Relative: 5 %
HCT: 27.5 % — ABNORMAL LOW (ref 39.0–52.0)
Hemoglobin: 8.1 g/dL — ABNORMAL LOW (ref 13.0–17.0)
Immature Granulocytes: 16 %
Lymphocytes Relative: 23 %
Lymphs Abs: 1.2 10*3/uL (ref 0.7–4.0)
MCH: 28.7 pg (ref 26.0–34.0)
MCHC: 29.5 g/dL — ABNORMAL LOW (ref 30.0–36.0)
MCV: 97.5 fL (ref 80.0–100.0)
Monocytes Absolute: 0.8 10*3/uL (ref 0.1–1.0)
Monocytes Relative: 16 %
Neutro Abs: 2.1 10*3/uL (ref 1.7–7.7)
Neutrophils Relative %: 39 %
Platelets: 92 10*3/uL — ABNORMAL LOW (ref 150–400)
RBC: 2.82 MIL/uL — ABNORMAL LOW (ref 4.22–5.81)
RDW: 18.1 % — ABNORMAL HIGH (ref 11.5–15.5)
WBC: 5.2 10*3/uL (ref 4.0–10.5)
nRBC: 2.7 % — ABNORMAL HIGH (ref 0.0–0.2)

## 2019-08-26 LAB — COMPREHENSIVE METABOLIC PANEL
ALT: 65 U/L — ABNORMAL HIGH (ref 0–44)
AST: 43 U/L — ABNORMAL HIGH (ref 15–41)
Albumin: 3.4 g/dL — ABNORMAL LOW (ref 3.5–5.0)
Alkaline Phosphatase: 68 U/L (ref 38–126)
Anion gap: 8 (ref 5–15)
BUN: 37 mg/dL — ABNORMAL HIGH (ref 6–20)
CO2: 25 mmol/L (ref 22–32)
Calcium: 9.4 mg/dL (ref 8.9–10.3)
Chloride: 114 mmol/L — ABNORMAL HIGH (ref 98–111)
Creatinine, Ser: 0.48 mg/dL — ABNORMAL LOW (ref 0.61–1.24)
GFR calc Af Amer: >60 mL/min (ref >60)
GFR calc non Af Amer: >60 mL/min (ref >60)
Glucose, Bld: 158 mg/dL — ABNORMAL HIGH (ref 70–99)
Potassium: 4.4 mmol/L (ref 3.5–5.1)
Sodium: 147 mmol/L — ABNORMAL HIGH (ref 135–145)
Total Bilirubin: 1 mg/dL (ref 0.3–1.2)
Total Protein: 6.3 g/dL — ABNORMAL LOW (ref 6.5–8.1)

## 2019-08-26 LAB — PREPARE RBC (CROSSMATCH)

## 2019-08-26 LAB — GLUCOSE, CAPILLARY
Glucose-Capillary: 134 mg/dL — ABNORMAL HIGH (ref 70–99)
Glucose-Capillary: 138 mg/dL — ABNORMAL HIGH (ref 70–99)
Glucose-Capillary: 143 mg/dL — ABNORMAL HIGH (ref 70–99)
Glucose-Capillary: 148 mg/dL — ABNORMAL HIGH (ref 70–99)
Glucose-Capillary: 150 mg/dL — ABNORMAL HIGH (ref 70–99)
Glucose-Capillary: 156 mg/dL — ABNORMAL HIGH (ref 70–99)
Glucose-Capillary: 163 mg/dL — ABNORMAL HIGH (ref 70–99)

## 2019-08-26 LAB — COOXEMETRY PANEL
Carboxyhemoglobin: 3.9 % — ABNORMAL HIGH (ref 0.5–1.5)
Methemoglobin: 1.2 % (ref 0.0–1.5)
O2 Saturation: 71.2 %
Total hemoglobin: 8.2 g/dL — ABNORMAL LOW (ref 12.0–16.0)

## 2019-08-26 LAB — APTT
aPTT: 118 seconds — ABNORMAL HIGH (ref 24–36)
aPTT: 93 seconds — ABNORMAL HIGH (ref 24–36)

## 2019-08-26 LAB — FIBRINOGEN: Fibrinogen: 682 mg/dL — ABNORMAL HIGH (ref 210–475)

## 2019-08-26 LAB — HEPARIN LEVEL (UNFRACTIONATED)
Heparin Unfractionated: 0.33 IU/mL (ref 0.30–0.70)
Heparin Unfractionated: 0.34 IU/mL (ref 0.30–0.70)
Heparin Unfractionated: 0.29 IU/mL — ABNORMAL LOW (ref 0.30–0.70)

## 2019-08-26 LAB — LACTIC ACID, PLASMA: Lactic Acid, Venous: 1.1 mmol/L (ref 0.5–1.9)

## 2019-08-26 LAB — LACTATE DEHYDROGENASE: LDH: 340 U/L — ABNORMAL HIGH (ref 98–192)

## 2019-08-26 MED ORDER — FUROSEMIDE 10 MG/ML IJ SOLN
INTRAMUSCULAR | Status: AC
  Administered 2019-08-26: 01:00:00 20 mg via INTRAVENOUS
  Filled 2019-08-26: qty 2

## 2019-08-26 MED ORDER — FUROSEMIDE 10 MG/ML IJ SOLN: 20.0000 mg | Freq: Once | INTRAMUSCULAR | Status: AC

## 2019-08-26 MED ORDER — LIDOCAINE-EPINEPHRINE (PF) 1 %-1:200000 IJ SOLN
10.0000 mL | Freq: Once | INTRAMUSCULAR | Status: DC
  Filled 2019-08-26: qty 10

## 2019-08-26 MED ORDER — LIDOCAINE-EPINEPHRINE 1 %-1:100000 IJ SOLN
10.0000 mL | Freq: Once | INTRAMUSCULAR | Status: AC
  Administered 2019-08-26: 14:00:00 10 mL via INTRADERMAL
  Filled 2019-08-26: qty 10

## 2019-08-26 MED ORDER — DEXTROSE 5 % IV SOLN
500.0000 mg | Freq: Once | INTRAVENOUS | Status: AC
  Administered 2019-08-26: 500 mg via INTRAVENOUS
  Filled 2019-08-26: qty 500

## 2019-08-26 MED ORDER — "THROMBI-PAD 3""X3"" EX PADS"
1.0000 | MEDICATED_PAD | Freq: Once | CUTANEOUS | Status: DC
  Filled 2019-08-26: qty 1

## 2019-08-26 MED ORDER — "THROMBI-PAD 3""X3"" EX PADS"
1.0000 | MEDICATED_PAD | Freq: Once | CUTANEOUS | Status: AC
  Administered 2019-08-26: 11:00:00 1 via TOPICAL
  Filled 2019-08-26: qty 1

## 2019-08-26 MED ORDER — MUPIROCIN 2 % EX OINT: 1.0000 "application " | TOPICAL_OINTMENT | Freq: Two times a day (BID) | CUTANEOUS | Status: DC

## 2019-08-26 MED ORDER — "THROMBI-PAD 3""X3"" EX PADS"
1.0000 | MEDICATED_PAD | Freq: Once | CUTANEOUS | Status: AC
  Administered 2019-08-26: 09:00:00 1 via TOPICAL
  Filled 2019-08-26: qty 1

## 2019-08-26 MED ORDER — FUROSEMIDE 10 MG/ML IJ SOLN
INTRAMUSCULAR | Status: AC
  Administered 2019-08-26: 20 mg via INTRAVENOUS
  Filled 2019-08-26: qty 2

## 2019-08-26 MED ORDER — SODIUM CHLORIDE 0.9% IV SOLUTION: Freq: Once | INTRAVENOUS | Status: AC

## 2019-08-26 NOTE — Progress Notes (Signed)
ANTICOAGULATION CONSULT NOTE  Pharmacy Consult for Heparin Indication: ECMO  No Known Allergies  Patient Measurements: Height: 5' 8"  (172.7 cm) Weight: (!) 318 lb 9 oz (144.5 kg) IBW/kg (Calculated) : 68.4 Heparin Dosing Weight: 100.7 kg  Vital Signs: Temp: 98.8 F (37.1 C) (01/18 0415) Temp Source: Oral (01/18 0415) BP: 143/59 (01/18 0334) Pulse Rate: 73 (01/18 0415)  Labs: Recent Labs    08/24/19 0352 08/24/19 0401 08/24/19 1805 08/24/19 1817 08/24/19 1947 08/24/19 2349 08/25/19 0445 08/25/19 0452 08/25/19 0752 08/25/19 0903 08/25/19 1101 08/25/19 1703 08/25/19 2200 08/25/19 2200 08/25/19 2328 08/26/19 0306 08/26/19 0320  HGB 8.3*   < > 8.4*   < >  --    < > 8.0*   < >  --    < >   < >  --  8.8*   < > 8.3* 8.1*  --   HCT 28.2*   < > 28.3*   < >  --    < > 27.2*   < >  --    < >   < >  --  26.0*  --  28.2* 27.5*  --   PLT 91*   < > 86*   < >  --   --  90*  --   --   --   --   --   --   --  92* 92*  --   APTT 78*   < >  --   --  111*  --   --   --  95*  --   --  114*  --   --   --   --   --   LABPROT 16.1*  --   --   --   --   --   --   --   --   --   --   --   --   --   --   --   --   INR 1.3*  --   --   --   --   --   --   --   --   --   --   --   --   --   --   --   --   HEPARINUNFRC 0.26*   < >  --   --  0.33   < >  --   --  0.31  --   --  0.32  --   --   --  0.33  --   CREATININE 0.35*   < > 0.43*  --   --   --  0.43*  --   --   --   --   --   --   --   --   --  0.48*   < > = values in this interval not displayed.    Estimated Creatinine Clearance: 163 mL/min (A) (by C-G formula based on SCr of 0.48 mg/dL (L)).  Assessment: 44 yo male known COVID+ and ARDS VV ECMO. Heparin was restarted 1/5 (bivalirudin stopped as HIT test was negative).  Heparin level is therapeutic at 0.32, on 2600 units/hr. APTT came back at 114 (not correlating) - following heparin level. Hgb 7.8, plt 90. No s/sx of bleeding outside of oozing at nose, nasal packing remains in place.    1/18 AM update:  Heparin level remains therapeutic this morning Hgb/Plts low but stable   Goal of Therapy:  Heparin level: 0.3-0.5  units/mL APTT goal: 66-85 sec Monitor platelets by anticoagulation protocol: Yes   Plan:  -Continue heparin to 2600 units/hr -Recheck heparin level q12 hours 5a/5p -Monitor CBC, and for s/sx of bleeding  Narda Bonds, PharmD, BCPS Clinical Pharmacist Phone: [phone]

## 2019-08-26 NOTE — Progress Notes (Signed)
NAME:  [name], MRN:  [id number], DOB:  Dec 22, 1975, LOS: 28 ADMISSION DATE:  07/22/2019, CONSULTATION DATE:  12/24 REFERRING MD:  Sloan Leiter, CHIEF COMPLAINT:  Dyspnea   Brief History   44 y/o M admitted 12/14 with COVID pneumonia causing acute hypoxemic respiratory failure. Developed pneumomediastinum 12/23 with concern for possible bilateral small pneumothoraces on 12/24.  PCCM consulted for evaluation of pneumothoraces  Past Medical History  GERD HTN Asthma  Significant Hospital Events   12/14 admit with hypoxemic respiratory failure in setting of COVID-19 pneumonia 12/23 noted to have pneumomediastinum on chest x-ray 12/24 PCCM consulted for evaluation 12/26 worsening hypoxemia, intubated  12/27 VV fem/fem ECMO cannulation See bedside nursing event notes for full rundown of procedures after 12/27   Consults:  PCCM, CTS, CHF, Palliative  Procedures:  See bedside nursing event notes for full rundown of procedures.  Significant Diagnostic Tests:  CT chest 12/22 >> extensive pneumomediastinum, multifocal patchy bilateral groundglass opacities  Micro Data:  BCx2 12/14 >> negative  Sputum 12/15 >> negative  1/9 bronch: serratia and MSSA 1/2 acid fast smear bronch: negative 1/2 fungal cx bronch: pending 1/12 BAL: serratia 1/18 BAL >>  Antimicrobials:  Received remdesivir, steroids, actemra and convalescent plasma  5 days cefepime 12/24-12/29 Cefepime 1/4->1/14 vanc 1/4->1/14 Ceftriaxone 1/14 >>   Interim history/subjective:  Still very positional for sats.  Required urgent bronch last night for desats, also received lasix.  Objective   Blood pressure (!) 143/59, pulse 72, temperature 98.8 F (37.1 C), temperature source Oral, resp. rate (!) 0, height _0  (1.727 m), weight (!) 152.2 kg, SpO2 (!) 87 %. CVP:  [0 mmHg-19 mmHg] 0 mmHg  Vent Mode: Bi-Vent FiO2 (%):  [100 %] 100 % PEEP:  [20 cmH20] 20 cmH20 Plateau Pressure:  [28 cmH20-32 cmH20] 32 cmH20    Intake/Output Summary (Last 24 hours) at 08/26/2019 5885 Last data filed at 08/26/2019 0645 Gross per 24 hour  Intake 8459.6 ml  Output 4505 ml  Net 3954.6 ml   Filed Weights   08/24/19 0400 08/25/19 0600 08/26/19 0500  Weight: (!) 142 kg (!) 144.5 kg (!) 152.2 kg   Examination GEN: obese ill man on vent HEENT: trach in place, small thick mucoid secretions, nasal packing in place bilaterally, now more clear serous drainage from nose CV: RRR, ext warm PULM: poor air movement, passive on vent GI: Soft, + BS EXT: Global anascarca NEURO: sedated, incomplete paralysis, still coughs with suctioning PSYCH: RASS -4 SKIN: pale Femoral cannulas in place, sites look okay Bilateral chest tubes in place, no tidaling or air leak, ongoing serosanguinous output Foley in place dark yellow urine Rectal tube in place with tan loose stool  CXR shows all segmental bronchi open, diffuse airspace disease CMP looks good Sugars look good LDH stable in 300s ABG good gas exchange paO2 stable Heparin 0.33 (goal 0.3-0.5) HgB 8.8 stable, plts 92 Lactate 1.1, stable  Flows around 5.2 Pven -110-120 Part 240 Delta 32 Ven sat 57  Vent Bivent Pi 32, Ti 2s, Te 20, Te 20 (rate 15), FiO2 100, TV 80 Plat 34 cmH2O 1/18  I/O: +4L 24h, +25L admission, weight 152 kg from 144 kg?! Discussed with RN, was obtained in reverse trendelenburg and may not be accurate  CXR: again all segments are up as evidenced by air bronchograms, looks stable from yesterday  Sedation  Oral Valium 12m q6h Dilaudid 843mq4h Seroquel 20048mID Gabapentin 450m77mh  IV Precedex @ 1.6mcg7m/hr (0-1.6) Versed @ 30 mg/hr (  1-40) Dilaudid @ 44m/hr (0.5-20)  Resolved Hospital Problem list    Assessment & Plan:    Acute hypoxemic respiratory failure with ARDS secondary to COVID-19, s/p tracheostomy requiring mechanical ventilation and VV ECMO support.  COVID has been treated with all evidence based therapies, we are  dealing with lingering severe diffuse alveolar damage.  -Continue ECMO at current flow rate.  -Bronchoscopy for secretion/clot clearance as needed -Continue to follow Vt on Bivent. Keep Driving pressure 15 of less. -Intermittent diuresis, discussed with CHF team daily -Continues to desaturate and chug with minimal stimulation.  Continue current level enteral sedation. -Sedation oral and IV as written above -Premedicate with Dilaudid prior to turns. -Heparin with 0.3-0.5 goal  - Discussions ongoing regarding adding return cannula vs. Switching to crescent - Reverse trendelenburg as tolerated until new drain placed - Given events of last night, will start daily bronchs  MSSA and serratia marcescens pneumonia:  cefepime and vancomycin completed for 10 days Still has serratia on BAL but no signs of infection Ceftriaxone with duration TBD for TSS ppx while nasal packing in place  Bilateral pneumothoraces and pneumomediastinum No leak today Continue suction  -20cmH2O  Posterior epistaxis.  Repacked multiple times  Next due for repacking 1/20, will do q5 days after discussion with ENT   Daily Goals Checklist  Pain/Anxiety/Delirium protocol (if indicated): see discussion above VAP protocol (if indicated): Bundle in place. Respiratory support goals: Continue Bivent with ultra-lung protective ventilation, generous PEEP Blood pressure target:  MAP>65, off pressors DVT prophylaxis: on systemic anticoagulation on VV-ECMO -  Nutrition Status: Nutrition Problem: Increased nutrient needs Etiology: acute illness(COVID PNA) Signs/Symptoms: estimated needs Interventions: Tube feeding, Prostat GI prophylaxis: Protonix per tube. Fluid status goals: even if allowed by pven Urinary catheter: Guide hemodynamic management Glucose control: euglycemic on SSI. Mobility/therapy needs: Bedrest.  Daily labs: as per ECMO protocol. Code Status: Full code  Family Communication: reached out 1/17, will be  called by CHF team for new cannula Disposition: ICU   Labs   CBC: Recent Labs  Lab 08/22/19 0336 08/22/19 0347 08/23/19 0335 08/23/19 0346 08/24/19 0352 08/24/19 0401 08/24/19 1213 08/24/19 1215 08/24/19 1805 08/24/19 1817 08/25/19 0445 08/25/19 0452 08/25/19 1101 08/25/19 2200 08/25/19 2328 08/26/19 0306 08/26/19 0458  WBC 5.0   < > 4.7   < > 4.6   < > 4.3  --  4.2  --  4.5  --   --   --  5.1 5.2  --   NEUTROABS 2.2  --  2.2  --  1.8  --   --   --   --   --  1.1*  --   --   --   --  2.1  --   HGB 8.5*   < > 7.9*   < > 8.3*   < > 7.7*   < > 8.4*   < > 8.0*   < > 7.8* 8.8* 8.3* 8.1* 8.8*  HCT 28.0*   < > 26.8*   < > 28.2*   < > 26.1*   < > 28.3*   < > 27.2*   < > 23.0* 26.0* 28.2* 27.5* 26.0*  MCV 97.2   < > 98.5   < > 97.6   < > 99.2  --  97.3  --  98.2  --   --   --  97.6 97.5  --   PLT 84*   < > 91*   < > 91*   < > 86*  --  86*  --  90*  --   --   --  92* 92*  --    < > = values in this interval not displayed.    Basic Metabolic Panel: Recent Labs  Lab 08/23/19 1622 08/23/19 1942 08/24/19 0352 08/24/19 0401 08/24/19 1805 08/24/19 1817 08/25/19 0445 08/25/19 0452 08/25/19 0903 08/25/19 1101 08/25/19 2200 08/26/19 0320 08/26/19 0458  NA 145   < > 144   < > 143   < > 148*   < > 149* 149* 150* 147* 148*  K 4.4   < > 3.9   < > 4.2   < > 4.1   < > 3.9 4.0 3.9 4.4 4.5  CL 111  --  109  --  111  --  113*  --   --   --   --  114*  --   CO2 25  --  24  --  23  --  25  --   --   --   --  25  --   GLUCOSE 192*  --  176*  --  158*  --  155*  --   --   --   --  158*  --   BUN 42*  --  38*  --  38*  --  39*  --   --   --   --  37*  --   CREATININE 0.56*  --  0.35*  --  0.43*  --  0.43*  --   --   --   --  0.48*  --   CALCIUM 9.2  --  9.0  --  8.9  --  9.3  --   --   --   --  9.4  --   MG  --   --  1.8  --   --   --  2.0  --   --   --   --   --   --    < > = values in this interval not displayed.   GFR: Estimated Creatinine Clearance: 168.1 mL/min (A) (by C-G formula  based on SCr of 0.48 mg/dL (L)). Recent Labs  Lab 08/23/19 0335 08/23/19 1018 08/24/19 0352 08/24/19 1213 08/24/19 1805 08/25/19 0445 08/25/19 2328 08/26/19 0306  WBC 4.7   < > 4.6   < > 4.2 4.5 5.1 5.2  LATICACIDVEN 1.0  --  1.1  --   --  1.1  --  1.1   < > = values in this interval not displayed.    Liver Function Tests: Recent Labs  Lab 08/21/19 0203 08/22/19 0336 08/23/19 0335 08/24/19 0352 08/26/19 0320  AST _0 43*  ALT _1 65*  ALKPHOS 50 46 52 54 68  BILITOT 0.8 0.8 0.8 0.8 1.0  PROT 5.9* 6.0* 6.1* 5.9* 6.3*  ALBUMIN 2.8* 3.2* 3.0* 2.6* 3.4*   No results for input(s): LIPASE, AMYLASE in the last 168 hours. No results for input(s): AMMONIA in the last 168 hours.  ABG    Component Value Date/Time   PHART 7.384 08/26/2019 0458   PCO2ART 43.1 08/26/2019 0458   PO2ART 54.0 (L) 08/26/2019 0458   HCO3 25.8 08/26/2019 0458   TCO2 27 08/26/2019 0458   ACIDBASEDEF 1.0 08/25/2019 0452   O2SAT 71.2 08/26/2019 0458   O2SAT 87.0 08/26/2019 0458     Coagulation Profile: Recent Labs  Lab 08/20/19 0444 08/21/19 0203 08/22/19 0336 08/23/19 0335 08/24/19  0352  INR 1.2 1.1 1.2 1.3* 1.3*    Cardiac Enzymes: No results for input(s): CKTOTAL, CKMB, CKMBINDEX, TROPONINI in the last 168 hours.  HbA1C: Hgb A1c MFr Bld  Date/Time Value Ref Range Status  07/24/2019 04:50 AM 6.7 (H) 4.8 - 5.6 % Final    Comment:    (NOTE) Pre diabetes:          5.7%-6.4% Diabetes:              >6.4% Glycemic control for   <7.0% adults with diabetes   09/30/2017 05:36 AM 5.7 (H) 4.8 - 5.6 % Final    Comment:    (NOTE)         Prediabetes: 5.7 - 6.4         Diabetes: >6.4         Glycemic control for adults with diabetes: <7.0     CBG: Recent Labs  Lab 08/25/19 1205 08/25/19 1521 08/25/19 2000 08/26/19 0031 08/26/19 0315  GLUCAP 130* 175* 152* 150* 156*     Critical care time:     CRITICAL CARE   The patient is critically ill with multiple  organ systems failure and requires high complexity decision making for assessment and support, frequent evaluation and titration of therapies, application of advanced monitoring technologies and extensive interpretation of multiple databases. Critical Care Time devoted to patient care services described in this note independent of APP/resident time (if applicable)  is 40 minutes.   Erskine Emery MD [facility] 08/26/2019 7:02 AM Personal pager: [phone] If unanswered, please page CCM On-call: [phone]    08/26/2019, 7:02 AM

## 2019-08-26 NOTE — Progress Notes (Signed)
ANTICOAGULATION CONSULT NOTE  Pharmacy Consult for Heparin Indication: ECMO  No Known Allergies  Patient Measurements: Height: 5' 8"  (172.7 cm) Weight: (!) 335 lb 8.6 oz (152.2 kg) IBW/kg (Calculated) : 68.4 Heparin Dosing Weight: 100.7 kg  Vital Signs: BP: 106/51 (01/18 1533) Pulse Rate: 75 (01/18 1630)  Labs: Recent Labs    08/24/19 0352 08/24/19 0401 08/24/19 1805 08/24/19 1817 08/25/19 0445 08/25/19 0452 08/25/19 1703 08/25/19 1703 08/25/19 2200 08/25/19 2328 08/26/19 0306 08/26/19 0320 08/26/19 0458 08/26/19 1044 08/26/19 1045 08/26/19 1045 08/26/19 1646 08/26/19 1652  HGB 8.3*   < > 8.4*   < > 8.0*   < >  --    < >   < > 8.3* 8.1*  --    < >  --  8.5*   < > 7.9* 8.2*  HCT 28.2*   < > 28.3*   < > 27.2*   < >  --    < >   < > 28.2* 27.5*  --    < >  --  25.0*  --  26.4* 24.0*  PLT 91*   < > 86*   < > 90*   < >  --   --   --  92* 92*  --   --   --   --   --  95*  --   APTT 78*   < >  --    < >  --    < > 114*  --   --   --  118*  --   --   --   --   --  93*  --   LABPROT 16.1*  --   --   --   --   --   --   --   --   --   --   --   --   --   --   --   --   --   INR 1.3*  --   --   --   --   --   --   --   --   --   --   --   --   --   --   --   --   --   HEPARINUNFRC 0.26*   < >  --    < >  --    < > 0.32   < >  --   --  0.33  --   --  0.34  --   --  0.29*  --   CREATININE 0.35*   < > 0.43*  --  0.43*  --   --   --   --   --   --  0.48*  --   --   --   --   --   --    < > = values in this interval not displayed.    Estimated Creatinine Clearance: 168.1 mL/min (A) (by C-G formula based on SCr of 0.48 mg/dL (L)).  Assessment: 44 yo male known COVID+ and ARDS VV ECMO. Heparin was restarted 1/5 (bivalirudin stopped as HIT test was negative).  Heparin level this evening is low at 0.29, aPTT therapeutic (not correlating), Hgb down, pRBCs ordered, bleeding around sutures earlier today noted to be controlled now.   Goal of Therapy:  Heparin level: 0.3-0.5  units/mL APTT goal: 66-85 sec Monitor platelets by anticoagulation protocol: Yes   Plan:  -Increase heparin to 2650  units/hr -Recheck heparin level q12 hours 5a/5p -Monitor CBC, and for s/sx of bleeding   Arrie Senate, PharmD, BCPS Clinical Pharmacist [phone] Please check AMION for all [facility] numbers 08/26/2019

## 2019-08-26 NOTE — Plan of Care (Signed)
  Problem: Education: Goal: Knowledge of risk factors and measures for prevention of condition will improve Outcome: Progressing   Problem: Coping: Goal: Psychosocial and spiritual needs will be supported Outcome: Progressing   Problem: Respiratory: Goal: Will maintain a patent airway Outcome: Progressing Goal: Complications related to the disease process, condition or treatment will be avoided or minimized Outcome: Progressing   Problem: Respiratory: Goal: Ability to maintain a clear airway and adequate ventilation will improve Outcome: Progressing   Problem: Role Relationship: Goal: Method of communication will improve Outcome: Progressing   Problem: Education: Goal: Knowledge of General Education information will improve Description: Including pain rating scale, medication(s)/side effects and non-pharmacologic comfort measures Outcome: Progressing   Problem: Health Behavior/Discharge Planning: Goal: Ability to manage health-related needs will improve Outcome: Progressing   Problem: Clinical Measurements: Goal: Ability to maintain clinical measurements within normal limits will improve Outcome: Progressing Goal: Will remain free from infection Outcome: Progressing Goal: Diagnostic test results will improve Outcome: Progressing Goal: Respiratory complications will improve Outcome: Progressing Goal: Cardiovascular complication will be avoided Outcome: Progressing   Problem: Activity: Goal: Risk for activity intolerance will decrease Outcome: Progressing   Problem: Nutrition: Goal: Adequate nutrition will be maintained Outcome: Progressing   Problem: Coping: Goal: Level of anxiety will decrease Outcome: Progressing   Problem: Elimination: Goal: Will not experience complications related to bowel motility Outcome: Progressing Goal: Will not experience complications related to urinary retention Outcome: Progressing   Problem: Pain Managment: Goal: General  experience of comfort will improve Outcome: Progressing   Problem: Safety: Goal: Ability to remain free from injury will improve Outcome: Progressing   Problem: Skin Integrity: Goal: Risk for impaired skin integrity will decrease Outcome: Progressing

## 2019-08-26 NOTE — Progress Notes (Signed)
CCM MD at bedside to bronch pt.

## 2019-08-26 NOTE — Procedures (Signed)
Bronchoscopy Procedure Note EUAN WANDLER [id number] [birth year]  Procedure: Bronchoscopy Indications: Remove secretions  Procedure Details Consent: In Chart Time Out: Verified patient identification, verified procedure, site/side was marked, verified correct patient position, special equipment/implants available, medications/allergies/relevent history reviewed, required imaging and test results available.  Performed  In preparation for procedure, patient was given 100% FiO2 and bronchoscope lubricated. Sedation: ICU sedation  Airway entered and the following bronchi were examined: RUL, RML, RLL, LUL, LLL and Bronchi.   Procedures performed: Therapeutic aspiration of bloody thick mucous performed from all bronchi. Washing performed. Bronchoscope removed.  , Patient placed back on 100% FiO2 at conclusion of procedure.    Evaluation Hemodynamic Status: BP stable throughout; O2 sats: stable throughout Patient's Current Condition: stable Specimens:  Sent thick bloody secretions Complications: No apparent complications Patient did tolerate procedure well.   Jacques Earthly 08/26/2019

## 2019-08-26 NOTE — Progress Notes (Signed)
Patient began to drop flows on the cardiohelp and the venous pressures were increasing and setting off the alarm. RPM's decreased slowly and requested the administration of 1 243m Albumin.  Upon completion of the albumin the patient was repositioned in the bed to a 20 degree angle. Patient had a significant decrease in O2 saturation. Slowly increased RPM's with very little increase in O2 saturation.  Contacted ECMO coordinator and Dr. BSung Amabile advised to give one more albumin, obtain another CXR and contact CCM ground team to request bronchoscopy.  This ECMO specialist stayed at bedside with MD KRandell Patient bedside RN, and respiratory therapist during the procedure.  No complications with pump/flows during procedure.

## 2019-08-26 NOTE — Progress Notes (Signed)
Patient ID: [name], male   DOB: Oct 08, 1975, 44 y.o.   MRN: [id number]    Advanced Heart Failure Rounding Note   Subjective:    Circuit changed and trach exchanged on 1/8.   Remains on VV ecmo with good flows.   Oxygen saturations dropped last night into 60-70s. CXR with worsening consolidation RLL. Underwent bronch with suction of clots and got IV lasix.   Sats now in mid to high 80s.   ABG 7.33/50/50/83%  Co-ox 71%  CXR: Diffuse bilateral lung opacities c/w ARDS, slightly worse RLL. Personally reviewed  On vent 100% PEEP 20. PlatP 30, Bi-vent setting. Personally reviewed   ECMO parameters Flow 5.0 RPM 3675 dP 31 Pressure -119 LDH 340  Objective:   Weight Range:  Vital Signs:   Temp:  [97.3 F (36.3 C)-98.8 F (37.1 C)] 98.8 F (37.1 C) (01/18 0415) Pulse Rate:  [68-94] 77 (01/18 1533) Resp:  [0-17] 15 (01/18 1533) BP: (106-173)/(51-68) 106/51 (01/18 1533) SpO2:  [52 %-92 %] 89 % (01/18 1533) Arterial Line BP: (91-171)/(42-76) 107/51 (01/18 1530) FiO2 (%):  [40 %-100 %] 100 % (01/18 1533) Weight:  [152.2 kg] 152.2 kg (01/18 0500) Last BM Date: 08/26/19  Weight change: Filed Weights   08/24/19 0400 08/25/19 0600 08/26/19 0500  Weight: (!) 142 kg (!) 144.5 kg (!) 152.2 kg    Intake/Output:   Intake/Output Summary (Last 24 hours) at 08/26/2019 1554 Last data filed at 08/26/2019 1441 Gross per 24 hour  Intake 8113.99 ml  Output 4795 ml  Net 3318.99 ml     Physical Exam: General: Sedated on vent.  Neck: Thick, JVP difficult, no thyromegaly or thyroid nodule. Tracheostomy.  Cor: PMI nondisplaced. Regular rate & rhythm. No rubs, gallops or murmurs. Lungs: minimal excursion Abdomen: obese soft, nontender, nondistended. No hepatosplenomegaly. No bruits or masses. Good bowel sounds. Extremities: no cyanosis, clubbing, rash, 1+ edema cannulas ok  Neuro: intubated. sedated   Telemetry: Sinus 70-80s Personally reviewed  Labs: Basic Metabolic  Panel: Recent Labs  Lab 08/23/19 1622 08/23/19 1942 08/24/19 0352 08/24/19 0401 08/24/19 1805 08/24/19 1817 08/25/19 0445 08/25/19 0452 08/25/19 1101 08/25/19 2200 08/26/19 0320 08/26/19 0458 08/26/19 1045  NA 145   < > 144   < > 143   < > 148*   < > 149* 150* 147* 148* 150*  K 4.4   < > 3.9   < > 4.2   < > 4.1   < > 4.0 3.9 4.4 4.5 4.2  CL 111  --  109  --  111  --  113*  --   --   --  114*  --   --   CO2 25  --  24  --  23  --  25  --   --   --  25  --   --   GLUCOSE 192*  --  176*  --  158*  --  155*  --   --   --  158*  --   --   BUN 42*  --  38*  --  38*  --  39*  --   --   --  37*  --   --   CREATININE 0.56*  --  0.35*  --  0.43*  --  0.43*  --   --   --  0.48*  --   --   CALCIUM 9.2   < > 9.0   < > 8.9  --  9.3  --   --   --  9.4  --   --   MG  --   --  1.8  --   --   --  2.0  --   --   --   --   --   --    < > = values in this interval not displayed.    Liver Function Tests: Recent Labs  Lab 08/21/19 0203 08/22/19 0336 08/23/19 0335 08/24/19 0352 08/26/19 0320  AST 17 15 18 19  43*  ALT 22 20 23 27  65*  ALKPHOS 50 46 52 54 68  BILITOT 0.8 0.8 0.8 0.8 1.0  PROT 5.9* 6.0* 6.1* 5.9* 6.3*  ALBUMIN 2.8* 3.2* 3.0* 2.6* 3.4*   No results for input(s): LIPASE, AMYLASE in the last 168 hours. No results for input(s): AMMONIA in the last 168 hours.  CBC: Recent Labs  Lab 08/22/19 0336 08/22/19 0347 08/23/19 0335 08/23/19 0346 08/24/19 0352 08/24/19 0401 08/24/19 1213 08/24/19 1215 08/24/19 1805 08/24/19 1817 08/25/19 0445 08/25/19 0452 08/25/19 2200 08/25/19 2328 08/26/19 0306 08/26/19 0458 08/26/19 1045  WBC 5.0   < > 4.7   < > 4.6   < > 4.3  --  4.2  --  4.5  --   --  5.1 5.2  --   --   NEUTROABS 2.2  --  2.2  --  1.8  --   --   --   --   --  1.1*  --   --   --  2.1  --   --   HGB 8.5*   < > 7.9*   < > 8.3*   < > 7.7*   < > 8.4*   < > 8.0*   < > 8.8* 8.3* 8.1* 8.8* 8.5*  HCT 28.0*   < > 26.8*   < > 28.2*   < > 26.1*   < > 28.3*   < > 27.2*   < >  26.0* 28.2* 27.5* 26.0* 25.0*  MCV 97.2   < > 98.5   < > 97.6   < > 99.2  --  97.3  --  98.2  --   --  97.6 97.5  --   --   PLT 84*   < > 91*   < > 91*   < > 86*  --  86*  --  90*  --   --  92* 92*  --   --    < > = values in this interval not displayed.    Cardiac Enzymes: No results for input(s): CKTOTAL, CKMB, CKMBINDEX, TROPONINI in the last 168 hours.  BNP: BNP (last 3 results) Recent Labs    07/22/19 1039  BNP 15.3    ProBNP (last 3 results) No results for input(s): PROBNP in the last 8760 hours.    Other results:  Imaging: DG CHEST PORT 1 VIEW  Result Date: 08/26/2019 CLINICAL DATA:  Patient on ventilator. EXAM: PORTABLE CHEST 1 VIEW COMPARISON:  August 25, 2019. FINDINGS: Tracheostomy and feeding tubes are unchanged in position. No pneumothorax or pleural effusion is noted. Stable bilateral chest tubes are noted. Stable diffuse lung opacities are noted consistent multifocal pneumonia. Bony thorax is unremarkable. IMPRESSION: Stable support apparatus. Stable bilateral lung opacities consistent with multifocal pneumonia. Electronically Signed   By: Marijo Conception M.D.   On: 08/26/2019 08:17   DG CHEST PORT 1 VIEW  Result Date: 08/26/2019 CLINICAL DATA:  Hypoxia. EXAM: PORTABLE CHEST 1 VIEW COMPARISON:  Radiograph earlier this day  FINDINGS: Endotracheal/tracheostomy tube tip at the thoracic inlet. Weighted enteric tube in place with tip below the diaphragm. Right upper extremity PICC in place. Bilateral chest tubes in place. ECMO catheter from an inferior approach unchanged. Diffuse dense bilateral airspace disease, equivocal worsening in the interim. Central air bronchograms. No visualized pneumothorax. IMPRESSION: 1. Diffuse dense bilateral airspace disease, equivocal worsening since earlier this day. 2. Support apparatus unchanged. Electronically Signed   By: Keith Rake M.D.   On: 08/26/2019 00:00   DG CHEST PORT 1 VIEW  Result Date: 08/25/2019 CLINICAL DATA:  Acute  respiratory failure with hypoxia. ECMO. EXAM: PORTABLE CHEST 1 VIEW COMPARISON:  One-view chest x-ray 08/24/2019 FINDINGS: Tracheostomy tube and bilateral chest tubes are stable. Right-sided PICC line is in place. Feeding tube courses off the inferior border of the film. The heart is enlarged. Diffuse interstitial and airspace disease is similar the prior study. IMPRESSION: 1. Stable appearance of diffuse interstitial and airspace disease compatible with ARDS. 2. Stable support apparatus. Electronically Signed   By: San Morelle M.D.   On: 08/25/2019 07:36     Medications:     Scheduled Medications: . artificial tears  1 application Both Eyes X7W  . vitamin C  500 mg Per Tube Daily  . chlorhexidine gluconate (MEDLINE KIT)  15 mL Mouth Rinse BID  . Chlorhexidine Gluconate Cloth  6 each Topical Daily  . diazepam  20 mg Per Tube Q6H  . feeding supplement (PRO-STAT SUGAR FREE 64)  60 mL Per Tube BID  . free water  300 mL Per Tube Q8H  . gabapentin  450 mg Per Tube Q8H  . HYDROmorphone  8 mg Per Tube Q4H  . insulin aspart  3-9 Units Subcutaneous Q4H  . ipratropium-albuterol  3 mL Nebulization QID  . mouth rinse  15 mL Mouth Rinse 10 times per day  . montelukast  10 mg Oral QHS  . oxymetazoline  1 spray Each Nare BID  . pantoprazole sodium  40 mg Per Tube QHS  . QUEtiapine  200 mg Per Tube BID  . sodium chloride flush  10-40 mL Intracatheter Q12H  . sodium chloride flush  10-40 mL Intracatheter Q12H  . Thrombi-Pad  1 each Topical Once  . zinc sulfate  220 mg Per Tube Daily    Infusions: . sodium chloride    . sodium chloride 10 mL/hr at 08/26/19 1400  . sodium chloride 10 mL/hr at 08/26/19 1400  . albumin human Stopped (08/25/19 2354)  . cefTRIAXone (ROCEPHIN)  IV Stopped (08/26/19 0946)  . cisatracurium (NIMBEX) infusion 3 mcg/kg/min (08/26/19 1441)  . dexmedetomidine (PRECEDEX) IV infusion 1.6 mcg/kg/hr (08/26/19 1546)  . dextrose    . feeding supplement (PIVOT 1.5 CAL)  1,000 mL (08/26/19 0333)  . heparin 2,600 Units/hr (08/26/19 1400)  . HYDROmorphone 20 mg/hr (08/26/19 1400)  . midazolam (VERSED) infusion 35 mg/hr (08/26/19 1508)  . norepinephrine (LEVOPHED) Adult infusion Stopped (08/26/19 1014)  . propofol (DIPRIVAN) infusion Stopped (08/21/19 1030)    PRN Medications: Place/Maintain arterial line **AND** sodium chloride, sodium chloride, sodium chloride, acetaminophen (TYLENOL) oral liquid 160 mg/5 mL, acetaminophen, albumin human, dextrose, hydrALAZINE, HYDROmorphone, magnesium hydroxide, metoprolol tartrate, midazolam, ondansetron (ZOFRAN) IV, senna-docusate, sodium chloride, sodium chloride flush, sodium chloride flush, sodium chloride flush   Assessment/Plan:    1. Acute hypoxic/hypercarbic respiratory failure due to COVID PNA: Remained markedly hypoxic despite full support. Intubated 12/26. S/p bilateral CTs 12/26 for pneumothoraces.  TEE on 12/26 LVEF 70% RV ok. VV ECMO begun 12/26.  Tracheostomy 12/29.  COVID ARDS at this point.  Flows stable and sats improved with deeper sedation and re-paralysis. ECMO circuit changed 1/8 - ECMO parameters stable.  - hypoxic again overnight. Improved with suctioning clots and lasix.but still < 90%  - Main issue is bad lungs. Continue maneuvers to improve lung compliance - daily bronch with removal of clots, diuresis and standing position - long discussion about adding another cannula for VAV or to increase venous drainage. Will add drainage cannula when it arrives.  - Continue to wait on lung recovery - Continues to have epistaxis with lung clots. Periodic bronchoscopy per CCM.  - LDH 340, stable   2. Bilateral PTX: Due to barotrauma.  - Driving pressure minimized at < 15 Stable. No change  3. COVID PNA: management of COVID infection per CCM. CXR with bilateral multifocal PNA progressing to likely ARDS.  - s/p 10 days of remdesivir - 12/16, 12/2 s/p tociluzimab - 12/17 s/p convalescent plasma - Decadron  at 28m/daily completed 1/11 - Completed cefepime/vanc - On ceftriaxone for nasal packing  4. ID: Empiric coverage with vancomycin/cefepime initially for possible secondary bacterial PNA, course completed.   - treated with cefepime and vanc for + cx with staph epidimidis, MSSA and serratia (sensitivities ok). - Cover with ceftriaxone for nasal packing - Remains stable - D/w PharmD  5. Thrombocytopenia: Slow decrease in platelets, likely low level hemolysis + critical illness.  - Stable 92k this am.  - Back on heparin. Heparin dosing d/w PharmD  6. Anemia: Bleeding at trach site resolved but still with epistaxis - hgb 8.8 today - Transfuse hgb < 8  7. FEN - continue TFs  D/w with ECMO team (ECMO coordinator/specialists, CCM, TCTS and pharmD) at bedside on multidisciplinary rounds. Continue to await ARDS recovery. Details as above.   Length of Stay: 35   CRITICAL CARE Performed by: DGlori Bickers Total critical care time: 35 minutes  Critical care time was exclusive of separately billable procedures and treating other patients.  Critical care was necessary to treat or prevent imminent or life-threatening deterioration.  Critical care was time spent personally by me on the following activities: development of treatment plan with patient and/or surrogate as well as nursing, discussions with consultants, evaluation of patient's response to treatment, examination of patient, obtaining history from patient or surrogate, ordering and performing treatments and interventions, ordering and review of laboratory studies, ordering and review of radiographic studies, pulse oximetry and re-evaluation of patient's condition.    DGlori BickersMD 08/26/2019, 3:54 PM  Advanced Heart Failure Team Pager [phone](M-F; 7a - 4p)  Please contact [facility] for night-coverage after hours (4p -7a ) and weekends on [email or web address]

## 2019-08-27 ENCOUNTER — Inpatient Hospital Stay (HOSPITAL_COMMUNITY): Payer: Managed Care, Other (non HMO)

## 2019-08-27 DIAGNOSIS — R0603 Acute respiratory distress: Secondary | ICD-10-CM

## 2019-08-27 LAB — CBC
HCT: 28.6 % — ABNORMAL LOW (ref 39.0–52.0)
HCT: 29.4 % — ABNORMAL LOW (ref 39.0–52.0)
Hemoglobin: 8.5 g/dL — ABNORMAL LOW (ref 13.0–17.0)
Hemoglobin: 8.7 g/dL — ABNORMAL LOW (ref 13.0–17.0)
MCH: 28.8 pg (ref 26.0–34.0)
MCH: 29 pg (ref 26.0–34.0)
MCHC: 29.6 g/dL — ABNORMAL LOW (ref 30.0–36.0)
MCHC: 29.7 g/dL — ABNORMAL LOW (ref 30.0–36.0)
MCV: 97.4 fL (ref 80.0–100.0)
MCV: 97.6 fL (ref 80.0–100.0)
Platelets: 96 K/uL — ABNORMAL LOW (ref 150–400)
Platelets: 97 10*3/uL — ABNORMAL LOW (ref 150–400)
RBC: 2.93 MIL/uL — ABNORMAL LOW (ref 4.22–5.81)
RBC: 3.02 MIL/uL — ABNORMAL LOW (ref 4.22–5.81)
RDW: 18 % — ABNORMAL HIGH (ref 11.5–15.5)
RDW: 18.2 % — ABNORMAL HIGH (ref 11.5–15.5)
WBC: 6.7 10*3/uL (ref 4.0–10.5)
WBC: 7.8 K/uL (ref 4.0–10.5)
nRBC: 2.7 % — ABNORMAL HIGH (ref 0.0–0.2)
nRBC: 3 % — ABNORMAL HIGH (ref 0.0–0.2)

## 2019-08-27 LAB — CBC WITH DIFFERENTIAL/PLATELET
Abs Immature Granulocytes: 1.53 10*3/uL — ABNORMAL HIGH (ref 0.00–0.07)
Basophils Absolute: 0.1 10*3/uL (ref 0.0–0.1)
Basophils Relative: 1 %
Eosinophils Absolute: 0.3 10*3/uL (ref 0.0–0.5)
Eosinophils Relative: 4 %
HCT: 27.5 % — ABNORMAL LOW (ref 39.0–52.0)
Hemoglobin: 8.4 g/dL — ABNORMAL LOW (ref 13.0–17.0)
Immature Granulocytes: 24 %
Lymphocytes Relative: 18 %
Lymphs Abs: 1.2 10*3/uL (ref 0.7–4.0)
MCH: 29.3 pg (ref 26.0–34.0)
MCHC: 30.5 g/dL (ref 30.0–36.0)
MCV: 95.8 fL (ref 80.0–100.0)
Monocytes Absolute: 1 10*3/uL (ref 0.1–1.0)
Monocytes Relative: 15 %
Neutro Abs: 2.4 10*3/uL (ref 1.7–7.7)
Neutrophils Relative %: 38 %
Platelets: 92 10*3/uL — ABNORMAL LOW (ref 150–400)
RBC: 2.87 MIL/uL — ABNORMAL LOW (ref 4.22–5.81)
RDW: 17.8 % — ABNORMAL HIGH (ref 11.5–15.5)
WBC: 6.4 10*3/uL (ref 4.0–10.5)
nRBC: 2.8 % — ABNORMAL HIGH (ref 0.0–0.2)

## 2019-08-27 LAB — POCT I-STAT 7, (LYTES, BLD GAS, ICA,H+H)
Acid-Base Excess: 2 mmol/L (ref 0.0–2.0)
Acid-base deficit: 2 mmol/L (ref 0.0–2.0)
Acid-base deficit: 7 mmol/L — ABNORMAL HIGH (ref 0.0–2.0)
Bicarbonate: 18 mmol/L — ABNORMAL LOW (ref 20.0–28.0)
Bicarbonate: 24.1 mmol/L (ref 20.0–28.0)
Bicarbonate: 25.1 mmol/L (ref 20.0–28.0)
Bicarbonate: 25.3 mmol/L (ref 20.0–28.0)
Bicarbonate: 25.5 mmol/L (ref 20.0–28.0)
Bicarbonate: 26.5 mmol/L (ref 20.0–28.0)
Calcium, Ion: 1.14 mmol/L — ABNORMAL LOW (ref 1.15–1.40)
Calcium, Ion: 1.3 mmol/L (ref 1.15–1.40)
Calcium, Ion: 1.31 mmol/L (ref 1.15–1.40)
Calcium, Ion: 1.33 mmol/L (ref 1.15–1.40)
Calcium, Ion: 1.34 mmol/L (ref 1.15–1.40)
Calcium, Ion: 1.35 mmol/L (ref 1.15–1.40)
HCT: 20 % — ABNORMAL LOW (ref 39.0–52.0)
HCT: 26 % — ABNORMAL LOW (ref 39.0–52.0)
HCT: 27 % — ABNORMAL LOW (ref 39.0–52.0)
HCT: 27 % — ABNORMAL LOW (ref 39.0–52.0)
HCT: 29 % — ABNORMAL LOW (ref 39.0–52.0)
HCT: 37 % — ABNORMAL LOW (ref 39.0–52.0)
Hemoglobin: 12.6 g/dL — ABNORMAL LOW (ref 13.0–17.0)
Hemoglobin: 6.8 g/dL — CL (ref 13.0–17.0)
Hemoglobin: 8.8 g/dL — ABNORMAL LOW (ref 13.0–17.0)
Hemoglobin: 9.2 g/dL — ABNORMAL LOW (ref 13.0–17.0)
Hemoglobin: 9.2 g/dL — ABNORMAL LOW (ref 13.0–17.0)
Hemoglobin: 9.9 g/dL — ABNORMAL LOW (ref 13.0–17.0)
O2 Saturation: 100 %
O2 Saturation: 78 %
O2 Saturation: 82 %
O2 Saturation: 82 %
O2 Saturation: 85 %
O2 Saturation: 86 %
Patient temperature: 36.4
Patient temperature: 36.5
Patient temperature: 97.1
Patient temperature: 97.6
Patient temperature: 98.8
Patient temperature: 98.9
Potassium: 2.9 mmol/L — ABNORMAL LOW (ref 3.5–5.1)
Potassium: 3.9 mmol/L (ref 3.5–5.1)
Potassium: 4 mmol/L (ref 3.5–5.1)
Potassium: 4.2 mmol/L (ref 3.5–5.1)
Potassium: 4.3 mmol/L (ref 3.5–5.1)
Potassium: 4.3 mmol/L (ref 3.5–5.1)
Sodium: 145 mmol/L (ref 135–145)
Sodium: 146 mmol/L — ABNORMAL HIGH (ref 135–145)
Sodium: 146 mmol/L — ABNORMAL HIGH (ref 135–145)
Sodium: 147 mmol/L — ABNORMAL HIGH (ref 135–145)
Sodium: 148 mmol/L — ABNORMAL HIGH (ref 135–145)
Sodium: 151 mmol/L — ABNORMAL HIGH (ref 135–145)
TCO2: 19 mmol/L — ABNORMAL LOW (ref 22–32)
TCO2: 25 mmol/L (ref 22–32)
TCO2: 26 mmol/L (ref 22–32)
TCO2: 27 mmol/L (ref 22–32)
TCO2: 27 mmol/L (ref 22–32)
TCO2: 28 mmol/L (ref 22–32)
pCO2 arterial: 35 mmHg (ref 32.0–48.0)
pCO2 arterial: 37.4 mmHg (ref 32.0–48.0)
pCO2 arterial: 41.4 mmHg (ref 32.0–48.0)
pCO2 arterial: 42.8 mmHg (ref 32.0–48.0)
pCO2 arterial: 43 mmHg (ref 32.0–48.0)
pCO2 arterial: 45.3 mmHg (ref 32.0–48.0)
pH, Arterial: 7.319 — ABNORMAL LOW (ref 7.350–7.450)
pH, Arterial: 7.332 — ABNORMAL LOW (ref 7.350–7.450)
pH, Arterial: 7.374 (ref 7.350–7.450)
pH, Arterial: 7.377 (ref 7.350–7.450)
pH, Arterial: 7.396 (ref 7.350–7.450)
pH, Arterial: 7.456 — ABNORMAL HIGH (ref 7.350–7.450)
pO2, Arterial: 364 mmHg — ABNORMAL HIGH (ref 83.0–108.0)
pO2, Arterial: 42 mmHg — ABNORMAL LOW (ref 83.0–108.0)
pO2, Arterial: 48 mmHg — ABNORMAL LOW (ref 83.0–108.0)
pO2, Arterial: 50 mmHg — ABNORMAL LOW (ref 83.0–108.0)
pO2, Arterial: 50 mmHg — ABNORMAL LOW (ref 83.0–108.0)
pO2, Arterial: 51 mmHg — ABNORMAL LOW (ref 83.0–108.0)

## 2019-08-27 LAB — LACTIC ACID, PLASMA: Lactic Acid, Venous: 1.1 mmol/L (ref 0.5–1.9)

## 2019-08-27 LAB — GLUCOSE, CAPILLARY
Glucose-Capillary: 164 mg/dL — ABNORMAL HIGH (ref 70–99)
Glucose-Capillary: 135 mg/dL — ABNORMAL HIGH (ref 70–99)
Glucose-Capillary: 162 mg/dL — ABNORMAL HIGH (ref 70–99)
Glucose-Capillary: 167 mg/dL — ABNORMAL HIGH (ref 70–99)
Glucose-Capillary: 173 mg/dL — ABNORMAL HIGH (ref 70–99)
Glucose-Capillary: 171 mg/dL — ABNORMAL HIGH (ref 70–99)

## 2019-08-27 LAB — BASIC METABOLIC PANEL
Anion gap: 10 (ref 5–15)
BUN: 38 mg/dL — ABNORMAL HIGH (ref 6–20)
CO2: 24 mmol/L (ref 22–32)
Calcium: 9.2 mg/dL (ref 8.9–10.3)
Chloride: 112 mmol/L — ABNORMAL HIGH (ref 98–111)
Creatinine, Ser: 0.45 mg/dL — ABNORMAL LOW (ref 0.61–1.24)
GFR calc Af Amer: >60 mL/min (ref >60)
GFR calc non Af Amer: >60 mL/min (ref >60)
Glucose, Bld: 177 mg/dL — ABNORMAL HIGH (ref 70–99)
Potassium: 4.1 mmol/L (ref 3.5–5.1)
Sodium: 146 mmol/L — ABNORMAL HIGH (ref 135–145)

## 2019-08-27 LAB — BASIC METABOLIC PANEL WITH GFR
Anion gap: 10 (ref 5–15)
BUN: 41 mg/dL — ABNORMAL HIGH (ref 6–20)
CO2: 23 mmol/L (ref 22–32)
Calcium: 9.3 mg/dL (ref 8.9–10.3)
Chloride: 111 mmol/L (ref 98–111)
Creatinine, Ser: 0.6 mg/dL — ABNORMAL LOW (ref 0.61–1.24)
GFR calc Af Amer: >60 mL/min
GFR calc non Af Amer: >60 mL/min
Glucose, Bld: 213 mg/dL — ABNORMAL HIGH (ref 70–99)
Potassium: 4.2 mmol/L (ref 3.5–5.1)
Sodium: 144 mmol/L (ref 135–145)

## 2019-08-27 LAB — LACTATE DEHYDROGENASE: LDH: 321 U/L — ABNORMAL HIGH (ref 98–192)

## 2019-08-27 LAB — APTT
aPTT: 105 seconds — ABNORMAL HIGH (ref 24–36)
aPTT: 97 seconds — ABNORMAL HIGH (ref 24–36)

## 2019-08-27 LAB — COOXEMETRY PANEL
Carboxyhemoglobin: 3.8 % — ABNORMAL HIGH (ref 0.5–1.5)
Methemoglobin: 1.2 % (ref 0.0–1.5)
O2 Saturation: 69 %
Total hemoglobin: 7.2 g/dL — ABNORMAL LOW (ref 12.0–16.0)

## 2019-08-27 LAB — HEPARIN LEVEL (UNFRACTIONATED)
Heparin Unfractionated: 0.34 IU/mL (ref 0.30–0.70)
Heparin Unfractionated: 0.29 IU/mL — ABNORMAL LOW (ref 0.30–0.70)

## 2019-08-27 MED ORDER — MONTELUKAST SODIUM 10 MG PO TABS
10.0000 mg | ORAL_TABLET | Freq: Every day | ORAL | Status: DC
  Administered 2019-08-27 – 2019-11-10 (×73): 10 mg
  Filled 2019-08-27 (×73): qty 1

## 2019-08-27 MED ORDER — FUROSEMIDE 10 MG/ML IJ SOLN
11.0000 mg/h | INTRAVENOUS | Status: DC
  Administered 2019-08-27: 4 mg/h via INTRAVENOUS
  Administered 2019-08-28: 8 mg/h via INTRAVENOUS
  Administered 2019-08-30: 11 mg/h via INTRAVENOUS
  Filled 2019-08-27 (×4): qty 25

## 2019-08-27 NOTE — Progress Notes (Signed)
Nitric started on patient per MD smith. NO 20 ppm per order. RT will continue to monitor.

## 2019-08-27 NOTE — Progress Notes (Signed)
ANTICOAGULATION CONSULT NOTE  Pharmacy Consult for Heparin Indication: ECMO  No Known Allergies  Patient Measurements: Height: 5' 8"  (172.7 cm) Weight: (!) 329 lb 9.4 oz (149.5 kg) IBW/kg (Calculated) : 68.4 Heparin Dosing Weight: 100.7 kg  Vital Signs: Temp: 97.1 F (36.2 C) (01/19 1400) Temp Source: Axillary (01/19 1400) BP: 116/52 (01/19 1519) Pulse Rate: 48 (01/19 1700)  Labs: Recent Labs    08/26/19 0320 08/26/19 0458 08/26/19 1646 08/26/19 1652 08/27/19 0007 08/27/19 0007 08/27/19 0448 08/27/19 0456 08/27/19 1244 08/27/19 1244 08/27/19 1600 08/27/19 1700  HGB  --    < > 7.9*   < > 8.5*   < > 8.4*   < > 9.2*   < > 9.9* 8.7*  HCT  --    < > 26.4*   < > 28.6*   < > 27.5*   < > 27.0*  --  29.0* 29.4*  PLT  --    < > 95*   < > 97*  --  92*  --   --   --   --  96*  APTT  --    < > 93*  --   --   --  105*  --   --   --   --  97*  HEPARINUNFRC  --    < > 0.29*  --   --   --  0.34  --   --   --   --  0.29*  CREATININE 0.48*  --   --   --  0.45*  --   --   --   --   --   --  0.60*   < > = values in this interval not displayed.    Estimated Creatinine Clearance: 166.3 mL/min (A) (by C-G formula based on SCr of 0.6 mg/dL (L)).  Assessment: 44 yo male known COVID+ and ARDS VV ECMO. Heparin was restarted 1/5 (bivalirudin stopped as HIT test was negative).  Heparin level this evening is subtherapeutic, APTT remains at goal (following heparin levels as they are more accurate). Per RN nose beginning to ooze slightly - will need to watch.   Goal of Therapy:  Heparin level: 0.3-0.5 units/mL APTT goal: 66-85 sec Monitor platelets by anticoagulation protocol: Yes   Plan:  -Increase heparin to 2700 units/h -Heparin level q12 hours 5a/5p -Monitor CBC, and for s/sx of bleeding   Arrie Senate, PharmD, BCPS Clinical Pharmacist [phone] Please check AMION for all [name] numbers 08/27/2019

## 2019-08-27 NOTE — Progress Notes (Signed)
Patient ID: [name], male   DOB: 06/15/1976, 44 y.o.   MRN: [id number]    Advanced Heart Failure Rounding Note   Subjective:    Circuit changed and trach exchanged on 1/8.   Remains on VV ecmo with good flows.   Oxygen saturations in mid to high 80s most of night. Remains intubated/sedated  Bronch this am with RLL mucous clot but non bloody.   ABG 7.37/43/51/85%  Co-ox 69%  Lactate 1.1  CXR: Diffuse bilateral lung opacities c/w ARDS. No change. Personally reviewed  On vent 100% PEEP 20. PlatP 30, Bi-vent setting. No change. TV 60-80 range. Personally reviewed    ECMO parameters Flow 5.0 RPM 3770 dP 34 Pressure -109 LDH 321  Objective:   Weight Range:  Vital Signs:   Temp:  [97.8 F (36.6 C)-98.9 F (37.2 C)] 97.9 F (36.6 C) (01/19 0737) Pulse Rate:  [73-88] 85 (01/19 0700) Resp:  [0-20] 0 (01/19 0700) BP: (106-177)/(51-64) 177/64 (01/19 0800) SpO2:  [72 %-93 %] 83 % (01/19 0723) Arterial Line BP: (91-160)/(42-76) 157/61 (01/19 0700) FiO2 (%):  [100 %] 100 % (01/19 0725) Weight:  [149.5 kg] 149.5 kg (01/19 0500) Last BM Date: 08/26/19  Weight change: Filed Weights   08/25/19 0600 08/26/19 0500 08/27/19 0500  Weight: (!) 144.5 kg (!) 152.2 kg (!) 149.5 kg    Intake/Output:   Intake/Output Summary (Last 24 hours) at 08/27/2019 0829 Last data filed at 08/27/2019 0700 Gross per 24 hour  Intake 7814.66 ml  Output 4540 ml  Net 3274.66 ml     Physical Exam: General: Sedated on vent.  Neck: Thick, JVP difficult, no thyromegaly or thyroid nodule. Tracheostomy.  General:  Well appearing. No resp difficulty Cor: PMI nondisplaced. Regular rate & rhythm. No rubs, gallops or murmurs. Lungs: minimal air movement bilateral CTs Abdomen: obese soft, nontender, nondistended. No hepatosplenomegaly. No bruits or masses. Good bowel sounds. Extremities: no cyanosis, clubbing, rash, 2+ edema. Groin cannulas ok  Neuro: intubated/sedated   Telemetry: Sinus 80s  Personally reviewed  Labs: Basic Metabolic Panel: Recent Labs  Lab 08/24/19 0352 08/24/19 0401 08/24/19 1805 08/24/19 1817 08/25/19 0445 08/25/19 0452 08/26/19 0320 08/26/19 0458 08/26/19 1045 08/26/19 1652 08/26/19 2353 08/27/19 0007 08/27/19 0456  NA 144   < > 143   < > 148*   < > 147*   < > 150* 148* 151* 146* 148*  K 3.9   < > 4.2   < > 4.1   < > 4.4   < > 4.2 4.2 2.9* 4.1 4.3  CL 109  --  111  --  113*  --  114*  --   --   --   --  112*  --   CO2 24  --  23  --  25  --  25  --   --   --   --  24  --   GLUCOSE 176*  --  158*  --  155*  --  158*  --   --   --   --  177*  --   BUN 38*  --  38*  --  39*  --  37*  --   --   --   --  38*  --   CREATININE 0.35*  --  0.43*  --  0.43*  --  0.48*  --   --   --   --  0.45*  --   CALCIUM 9.0   < > 8.9   < >  9.3  --  9.4  --   --   --   --  9.2  --   MG 1.8  --   --   --  2.0  --   --   --   --   --   --   --   --    < > = values in this interval not displayed.    Liver Function Tests: Recent Labs  Lab 08/21/19 0203 08/22/19 0336 08/23/19 0335 08/24/19 0352 08/26/19 0320  AST _0 43*  ALT _1 65*  ALKPHOS 50 46 52 54 68  BILITOT 0.8 0.8 0.8 0.8 1.0  PROT 5.9* 6.0* 6.1* 5.9* 6.3*  ALBUMIN 2.8* 3.2* 3.0* 2.6* 3.4*   No results for input(s): LIPASE, AMYLASE in the last 168 hours. No results for input(s): AMMONIA in the last 168 hours.  CBC: Recent Labs  Lab 08/23/19 0335 08/23/19 0346 08/24/19 0352 08/24/19 0401 08/25/19 0445 08/25/19 0452 08/25/19 2328 08/25/19 2328 08/26/19 0306 08/26/19 0458 08/26/19 1646 08/26/19 1646 08/26/19 1652 08/26/19 2353 08/27/19 0007 08/27/19 0448 08/27/19 0456  WBC 4.7   < > 4.6   < > 4.5   < > 5.1  --  5.2  --  6.0  --   --   --  6.7 6.4  --   NEUTROABS 2.2  --  1.8  --  1.1*  --   --   --  2.1  --   --   --   --   --   --  2.4  --   HGB 7.9*   < > 8.3*   < > 8.0*   < > 8.3*   < > 8.1*   < > 7.9*   < > 8.2* 6.8* 8.5* 8.4* 9.2*  HCT 26.8*   < > 28.2*   < >  27.2*   < > 28.2*   < > 27.5*   < > 26.4*   < > 24.0* 20.0* 28.6* 27.5* 27.0*  MCV 98.5   < > 97.6   < > 98.2   < > 97.6  --  97.5  --  96.7  --   --   --  97.6 95.8  --   PLT 91*   < > 91*   < > 90*   < > 92*  --  92*  --  95*  --   --   --  97* 92*  --    < > = values in this interval not displayed.    Cardiac Enzymes: No results for input(s): CKTOTAL, CKMB, CKMBINDEX, TROPONINI in the last 168 hours.  BNP: BNP (last 3 results) Recent Labs    07/22/19 1039  BNP 15.3    ProBNP (last 3 results) No results for input(s): PROBNP in the last 8760 hours.    Other results:  Imaging: DG CHEST PORT 1 VIEW  Result Date: 08/27/2019 CLINICAL DATA:  Patient on ECMO. EXAM: PORTABLE CHEST 1 VIEW COMPARISON:  08/26/2019 FINDINGS: Tracheostomy tube is in a stable position. Feeding tube extends into the abdomen but the tip is beyond the image. Bilateral chest tubes are stable. Negative for a large pneumothorax. There continues to be low lung volumes with diffuse bilateral airspace densities. Cardiac silhouette is obscured by the lung densities. Right arm PICC line tip in the lower SVC region. Large vascular cannula in the IVC with the tip near the inferior cavoatrial junction. IMPRESSION:  Stable chest radiograph findings. Low lung volumes with diffuse bilateral airspace disease. No significant change in the support apparatuses. Electronically Signed   By: Markus Daft M.D.   On: 08/27/2019 08:04   DG CHEST PORT 1 VIEW  Result Date: 08/26/2019 CLINICAL DATA:  Patient on ventilator. EXAM: PORTABLE CHEST 1 VIEW COMPARISON:  August 25, 2019. FINDINGS: Tracheostomy and feeding tubes are unchanged in position. No pneumothorax or pleural effusion is noted. Stable bilateral chest tubes are noted. Stable diffuse lung opacities are noted consistent multifocal pneumonia. Bony thorax is unremarkable. IMPRESSION: Stable support apparatus. Stable bilateral lung opacities consistent with multifocal pneumonia.  Electronically Signed   By: Marijo Conception M.D.   On: 08/26/2019 08:17   DG CHEST PORT 1 VIEW  Result Date: 08/26/2019 CLINICAL DATA:  Hypoxia. EXAM: PORTABLE CHEST 1 VIEW COMPARISON:  Radiograph earlier this day FINDINGS: Endotracheal/tracheostomy tube tip at the thoracic inlet. Weighted enteric tube in place with tip below the diaphragm. Right upper extremity PICC in place. Bilateral chest tubes in place. ECMO catheter from an inferior approach unchanged. Diffuse dense bilateral airspace disease, equivocal worsening in the interim. Central air bronchograms. No visualized pneumothorax. IMPRESSION: 1. Diffuse dense bilateral airspace disease, equivocal worsening since earlier this day. 2. Support apparatus unchanged. Electronically Signed   By: Keith Rake M.D.   On: 08/26/2019 00:00     Medications:     Scheduled Medications: . artificial tears  1 application Both Eyes I7O  . vitamin C  500 mg Per Tube Daily  . chlorhexidine gluconate (MEDLINE KIT)  15 mL Mouth Rinse BID  . Chlorhexidine Gluconate Cloth  6 each Topical Daily  . diazepam  20 mg Per Tube Q6H  . feeding supplement (PRO-STAT SUGAR FREE 64)  60 mL Per Tube BID  . free water  300 mL Per Tube Q8H  . gabapentin  450 mg Per Tube Q8H  . HYDROmorphone  8 mg Per Tube Q4H  . insulin aspart  3-9 Units Subcutaneous Q4H  . ipratropium-albuterol  3 mL Nebulization QID  . mouth rinse  15 mL Mouth Rinse 10 times per day  . montelukast  10 mg Per Tube QHS  . oxymetazoline  1 spray Each Nare BID  . pantoprazole sodium  40 mg Per Tube QHS  . QUEtiapine  200 mg Per Tube BID  . sodium chloride flush  10-40 mL Intracatheter Q12H  . sodium chloride flush  10-40 mL Intracatheter Q12H  . Thrombi-Pad  1 each Topical Once  . zinc sulfate  220 mg Per Tube Daily    Infusions: . sodium chloride    . sodium chloride 10 mL/hr at 08/27/19 0700  . sodium chloride 10 mL/hr at 08/27/19 0700  . albumin human 12.5 g (08/27/19 0100)  .  cefTRIAXone (ROCEPHIN)  IV Stopped (08/26/19 0946)  . cisatracurium (NIMBEX) infusion 5.5 mcg/kg/min (08/27/19 0700)  . dexmedetomidine (PRECEDEX) IV infusion 1.6 mcg/kg/hr (08/27/19 0713)  . dextrose    . feeding supplement (PIVOT 1.5 CAL) 1,000 mL (08/26/19 1559)  . furosemide (LASIX) infusion    . heparin 2,650 Units/hr (08/27/19 0700)  . HYDROmorphone 20 mg/hr (08/27/19 0727)  . midazolam (VERSED) infusion 37 mg/hr (08/27/19 0700)  . norepinephrine (LEVOPHED) Adult infusion Stopped (08/26/19 2343)  . propofol (DIPRIVAN) infusion Stopped (08/21/19 1030)    PRN Medications: Place/Maintain arterial line **AND** sodium chloride, sodium chloride, sodium chloride, acetaminophen (TYLENOL) oral liquid 160 mg/5 mL, acetaminophen, albumin human, dextrose, hydrALAZINE, HYDROmorphone, magnesium hydroxide, metoprolol tartrate, midazolam,  ondansetron (ZOFRAN) IV, senna-docusate, sodium chloride, sodium chloride flush, sodium chloride flush, sodium chloride flush   Assessment/Plan:    1. Acute hypoxic/hypercarbic respiratory failure due to COVID PNA: Remained markedly hypoxic despite full support. Intubated 12/26. S/p bilateral CTs 12/26 for pneumothoraces.  TEE on 12/26 LVEF 70% RV ok. VV ECMO begun 12/26. Tracheostomy 12/29.  COVID ARDS at this point.  Flows stable and sats improved with deeper sedation and re-paralysis. ECMO circuit changed 1/8 - ECMO parameters stable.  - sats remain in 80s% but end-organ perfusion looks good. Tolerating well.   - Main issue is bad lungs. Continue maneuvers to improve lung compliance - daily bronch with removal of clots, diuresis and standing position. Wil start lasix gtt today as he is up over 50 pounds - plan to add additional venous drainage cannula in RIJ tomorrow - Continue to wait on lung recovery - Continues to have epistaxis with lung clots. Periodic bronchoscopy per CCM. Bronch today with mucus plug but no clots - LDH 321, stable   2. Bilateral PTX:  Due to barotrauma.  - Driving pressure minimized at < 15 Stable. No change  3. COVID PNA: management of COVID infection per CCM. CXR with bilateral multifocal PNA progressing to likely ARDS.  - s/p 10 days of remdesivir - 12/16, 12/2 s/p tociluzimab - 12/17 s/p convalescent plasma - Decadron at 33m/daily completed 1/11 - Completed cefepime/vanc - On ceftriaxone for nasal packing  4. ID: Empiric coverage with vancomycin/cefepime initially for possible secondary bacterial PNA, course completed.   - treated with cefepime and vanc for + cx with staph epidimidis, MSSA and serratia (sensitivities ok). - Cover with ceftriaxone for nasal packing - Remains stable - D/w PharmD  5. Thrombocytopenia: Slow decrease in platelets, likely low level hemolysis + critical illness.  - Stable 92k this am.  - Back on heparin. Heparin dosing d/w PharmD  6. Anemia: Bleeding at trach site resolved but still with epistaxis - hgb 9.2  today - Transfuse hgb < 8  7. FEN - continue TFs  D/w with ECMO team (ECMO coordinator/specialists, CCM, TCTS and pharmD) at bedside on multidisciplinary rounds. Continue to await ARDS recovery. Details as above.   Length of Stay: 381  CRITICAL CARE Performed by: DGlori Bickers Total critical care time: 35 minutes  Critical care time was exclusive of separately billable procedures and treating other patients.  Critical care was necessary to treat or prevent imminent or life-threatening deterioration.  Critical care was time spent personally by me on the following activities: development of treatment plan with patient and/or surrogate as well as nursing, discussions with consultants, evaluation of patient's response to treatment, examination of patient, obtaining history from patient or surrogate, ordering and performing treatments and interventions, ordering and review of laboratory studies, ordering and review of radiographic studies, pulse oximetry and re-evaluation  of patient's condition.    DGlori BickersMD 08/27/2019, 8:29 AM  Advanced Heart Failure Team Pager [phone](M-F; 7a - 4p)  Please contact [facility] for night-coverage after hours (4p -7a ) and weekends on [email or web address]

## 2019-08-27 NOTE — Progress Notes (Signed)
24 Days Post-Op Procedure(s) (LRB): ECMO CANNULATION (N/A) Subjective: Patient remains on VV ECMO with bilateral femoral vein cannulation and ECMO flow greater than 5 L/min.  Patient remains with suboptimal oxygenation probably related to patient BMI and maximum drainage has been reached with current cannula configuration.  Plan on placement of 19 French cannula to drain the SVC-cannula currently being shipped to site. Otherwise patient remains on FiO2 100% with PEEP at 20 cm H2O. Objective: Vital signs in last 24 hours: Temp:  [97.8 F (36.6 C)-98.9 F (37.2 C)] 97.9 F (36.6 C) (01/19 0737) Pulse Rate:  [73-88] 85 (01/19 0700) Cardiac Rhythm: Normal sinus rhythm (01/18 2000) Resp:  [0-20] 0 (01/19 0700) BP: (106-177)/(51-64) 177/64 (01/19 0800) SpO2:  [72 %-93 %] 83 % (01/19 0723) Arterial Line BP: (91-160)/(42-76) 157/61 (01/19 0700) FiO2 (%):  [100 %] 100 % (01/19 0725) Weight:  [149.5 kg] 149.5 kg (01/19 0500)  Hemodynamic parameters for last 24 hours: CVP:  [16 mmHg-17 mmHg] 17 mmHg  Intake/Output from previous day: 01/18 0701 - 01/19 0700 In: 8074.5 [I.V.:4366.4; Blood:320; NG/GT:2910; IV Piggyback:478.1] Out: 1610 [Urine:3280; Emesis/NG output:110; Stool:600; Chest Tube:550] Intake/Output this shift: No intake/output data recorded.    Lab Results: Recent Labs    08/27/19 0007 08/27/19 0007 08/27/19 0448 08/27/19 0456  WBC 6.7  --  6.4  --   HGB 8.5*   < > 8.4* 9.2*  HCT 28.6*   < > 27.5* 27.0*  PLT 97*  --  92*  --    < > = values in this interval not displayed.   BMET:  Recent Labs    08/26/19 0320 08/26/19 0458 08/27/19 0007 08/27/19 0456  NA 147*   < > 146* 148*  K 4.4   < > 4.1 4.3  CL 114*  --  112*  --   CO2 25  --  24  --   GLUCOSE 158*  --  177*  --   BUN 37*  --  38*  --   CREATININE 0.48*  --  0.45*  --   CALCIUM 9.4  --  9.2  --    < > = values in this interval not displayed.    PT/INR: No results for input(s): LABPROT, INR in the last  72 hours. ABG    Component Value Date/Time   PHART 7.377 08/27/2019 0456   HCO3 25.1 08/27/2019 0456   TCO2 26 08/27/2019 0456   ACIDBASEDEF 7.0 (H) 08/26/2019 2353   O2SAT 69.0 08/27/2019 0500   CBG (last 3)  Recent Labs    08/27/19 0454 08/27/19 0739 08/27/19 0822  GLUCAP 164* 135* 162*    Assessment/Plan: S/P Procedure(s) (LRB): ECMO CANNULATION (N/A) Plan addition of drainage cannula in the neck to drain the SVC probably tomorrow in OR.  Will check TEE for biventricular function at that time.  Tharon Aquas Trigt III 08/27/2019

## 2019-08-27 NOTE — Procedures (Signed)
Bronchoscopy  Indication:Mucus plugging  Consent: Signed in chart  Anesthesia: In place for ECMO  Procedure - Timeout performed - Bronchoscope advanced through ETT - Findings as below  Findings - All segments open except RLL - RLL with nonbloody (!) mucus plugs - Good trach position - Overall no blood seen which is a great improvement!  Specimen(s): none  Complications: none immediate

## 2019-08-27 NOTE — Progress Notes (Signed)
ANTICOAGULATION CONSULT NOTE  Pharmacy Consult for Heparin Indication: ECMO  No Known Allergies  Patient Measurements: Height: 5' 8"  (172.7 cm) Weight: (!) 329 lb 9.4 oz (149.5 kg) IBW/kg (Calculated) : 68.4 Heparin Dosing Weight: 100.7 kg  Vital Signs: Temp: 97.9 F (36.6 C) (01/19 0737) Temp Source: Core (01/19 0737) BP: 177/64 (01/19 0800) Pulse Rate: 85 (01/19 0700)  Labs: Recent Labs    08/25/19 0445 08/25/19 0452 08/26/19 0306 08/26/19 0320 08/26/19 0458 08/26/19 1044 08/26/19 1045 08/26/19 1646 08/26/19 1652 08/27/19 0007 08/27/19 0007 08/27/19 0448 08/27/19 0456  HGB 8.0*   < > 8.1*  --    < >  --    < > 7.9*   < > 8.5*   < > 8.4* 9.2*  HCT 27.2*   < > 27.5*  --    < >  --    < > 26.4*   < > 28.6*  --  27.5* 27.0*  PLT 90*   < > 92*  --    < >  --   --  95*  --  97*  --  92*  --   APTT  --    < > 118*  --   --   --   --  93*  --   --   --  105*  --   HEPARINUNFRC  --    < > 0.33  --    < > 0.34  --  0.29*  --   --   --  0.34  --   CREATININE 0.43*  --   --  0.48*  --   --   --   --   --  0.45*  --   --   --    < > = values in this interval not displayed.    Estimated Creatinine Clearance: 166.3 mL/min (A) (by C-G formula based on SCr of 0.45 mg/dL (L)).  Assessment: 44 yo male known COVID+ and ARDS VV ECMO. Heparin was restarted 1/5 (bivalirudin stopped as HIT test was negative).  Heparin level this morning is therapeutic at 0.34, aPTT supratherapeutic (not correlating, following heparin level). Hgb 9.2 s/p 1 unit PRBC last night. No significant bleeding per RN.   Goal of Therapy:  Heparin level: 0.3-0.5 units/mL APTT goal: 66-85 sec Monitor platelets by anticoagulation protocol: Yes   Plan:  -Continue heparin at 2650 units/hr -Heparin level q12 hours 5a/5p -Monitor CBC, and for s/sx of bleeding   Berenice Bouton, PharmD PGY1 Pharmacy Resident  Please check AMION for all [place] phone numbers After 10:00 PM, call [place] [phone]   08/27/2019

## 2019-08-27 NOTE — Progress Notes (Addendum)
NAME:  [name], MRN:  [id number], DOB:  28-Jan-1976, LOS: 78 ADMISSION DATE:  07/22/2019, CONSULTATION DATE:  12/24 REFERRING MD:  Sloan Leiter, CHIEF COMPLAINT:  Dyspnea   Brief History   44 y/o M admitted 12/14 with COVID pneumonia causing acute hypoxemic respiratory failure. Developed pneumomediastinum 12/23 with concern for possible bilateral small pneumothoraces on 12/24.  PCCM consulted for evaluation of pneumothoraces  Past Medical History  GERD HTN Asthma  Significant Hospital Events   12/14 admit with hypoxemic respiratory failure in setting of COVID-19 pneumonia 12/23 noted to have pneumomediastinum on chest x-ray 12/24 PCCM consulted for evaluation 12/26 worsening hypoxemia, intubated  12/27 VV fem/fem ECMO cannulation See bedside nursing event notes for full rundown of procedures after 12/27   Consults:  PCCM, CTS, CHF, Palliative  Procedures:  See bedside nursing event notes for full rundown of procedures.  Significant Diagnostic Tests:  CT chest 12/22 >> extensive pneumomediastinum, multifocal patchy bilateral groundglass opacities  Micro Data:  BCx2 12/14 >> negative  Sputum 12/15 >> negative  1/9 bronch: serratia and MSSA 1/2 acid fast smear bronch: negative 1/2 fungal cx bronch: pending 1/12 BAL: serratia 1/18 BAL >>  Antimicrobials:  Received remdesivir, steroids, actemra and convalescent plasma  5 days cefepime 12/24-12/29 Cefepime 1/4->1/14 vanc 1/4->1/14 Ceftriaxone 1/14 >>   Interim history/subjective:  Still having low sats in 70s, periodic, improved with reverse T burg and volume.  Objective   Blood pressure (!) 177/64, pulse 85, temperature 97.9 F (36.6 C), temperature source Core, resp. rate (!) 0, height _0  (1.727 m), weight (!) 149.5 kg, SpO2 (!) 83 %. CVP:  [16 mmHg-17 mmHg] 17 mmHg  Vent Mode: Bi-Vent FiO2 (%):  [100 %] 100 % Set Rate:  [15 bmp] 15 bmp PEEP:  [20 cmH20] 20 cmH20 Plateau Pressure:  [32 cmH20] 32 cmH20    Intake/Output Summary (Last 24 hours) at 08/27/2019 0856 Last data filed at 08/27/2019 0700 Gross per 24 hour  Intake 7814.66 ml  Output 4540 ml  Net 3274.66 ml   Filed Weights   08/25/19 0600 08/26/19 0500 08/27/19 0500  Weight: (!) 144.5 kg (!) 152.2 kg (!) 149.5 kg   Examination GEN: obese ill man on vent HEENT: trach in place, small thick mucoid secretions, nasal packing in place bilaterally, now more clear serous drainage from nose CV: RRR, ext warm PULM: poor air movement, passive on vent GI: Soft, + BS EXT: Global anascarca NEURO: sedated, incomplete paralysis, still coughs with suctioning PSYCH: RASS -4 SKIN: pale Femoral cannulas in place, sites look okay Bilateral chest tubes in place, no tidaling or air leak, ongoing serosanguinous output Foley in place dark yellow urine Rectal tube in place with tan loose stool  CXR shows all segmental bronchi open, diffuse airspace disease BMP looks good Sugars look good LDH stable in 300s ABG good gas exchange paO2 stable in low 50s Heparin 0.34 (goal 0.3-0.5) HgB 9.2 stable, plts 92, stable Lactate 1.1, stable  Flows around 5-5.2 Pven -110-120 Part 240 Delta 32 Ven sat 53  Vent Plat 32 cmH2O 1/19 Changed him to 30/15 Bivent 2/2s (RR 15), TV 100-110  I/O: +3.5L 24h, +25L admission, after discussion weight is up probably around 30-50 lbs for admission  CXR: again all segments are up as evidenced by air bronchograms, RLL a little worse  Sedation  Oral Valium 16m q6h Dilaudid 859mq4h Seroquel 20055mID Gabapentin 450m4mh  IV Precedex @ 1.6mcg58m/hr (0-1.6) Versed @ 30 mg/hr (1-40) Dilaudid @ 20mg/59m0.5-20)  Resolved Hospital Problem list    Assessment & Plan:    Acute hypoxemic respiratory failure with ARDS secondary to COVID-19, s/p tracheostomy requiring mechanical ventilation and VV ECMO support.  COVID has been treated with all evidence based therapies, we are dealing with lingering severe  diffuse alveolar damage.  -Continue ECMO at current flow rate.  -Bronchoscopy for secretion/clot clearance as needed -Continue to follow Vt on Bivent. Keep Driving pressure 15 of less. -Intermittent diuresis, discussed with CHF team daily -Continues to desaturate and chug with minimal stimulation.  Continue current level enteral sedation. -Sedation oral and IV as written above -Premedicate with Dilaudid prior to turns. -Heparin with 0.3-0.5 goal  - RIJ approach SVC return cannula today, 19Fr - Reverse trendelenburg as tolerated until new drain placed - Daily therapeutic bronchs - Diuretic drip challenge per CHF team  MSSA and serratia marcescens pneumonia:  cefepime and vancomycin completed for 10 days Still has serratia on BAL but no signs of infection Ceftriaxone with duration TBD for TSS ppx while nasal packing in place  Bilateral pneumothoraces and pneumomediastinum No leak today Continue suction  -20cmH2O  Posterior epistaxis.  Repacked multiple times  Next due for repacking 1/20 (would challenge with them out each time), will do q5 days after discussion with ENT   Daily Goals Checklist  Pain/Anxiety/Delirium protocol (if indicated): see discussion above VAP protocol (if indicated): Bundle in place. Respiratory support goals: Continue Bivent with ultra-lung protective ventilation, generous PEEP Blood pressure target:  MAP>65, off pressors DVT prophylaxis: on systemic anticoagulation on VV-ECMO -  Nutrition Status: Nutrition Problem: Increased nutrient needs Etiology: acute illness(COVID PNA) Signs/Symptoms: estimated needs Interventions: Tube feeding, Prostat GI prophylaxis: Protonix per tube. Fluid status goals: even if allowed by pven Urinary catheter: Guide hemodynamic management Glucose control: euglycemic on SSI. Mobility/therapy needs: Bedrest.  Daily labs: as per ECMO protocol. Code Status: Full code  Family Communication: reached out 1/17, in clinic today  so hopefully RN can do Disposition: ICU   Labs   CBC: Recent Labs  Lab 08/23/19 0335 08/23/19 0346 08/24/19 0352 08/24/19 0401 08/25/19 0445 08/25/19 0452 08/25/19 2328 08/25/19 2328 08/26/19 0306 08/26/19 0458 08/26/19 1646 08/26/19 1646 08/26/19 1652 08/26/19 2353 08/27/19 0007 08/27/19 0448 08/27/19 0456  WBC 4.7   < > 4.6   < > 4.5   < > 5.1  --  5.2  --  6.0  --   --   --  6.7 6.4  --   NEUTROABS 2.2  --  1.8  --  1.1*  --   --   --  2.1  --   --   --   --   --   --  2.4  --   HGB 7.9*   < > 8.3*   < > 8.0*   < > 8.3*   < > 8.1*   < > 7.9*   < > 8.2* 6.8* 8.5* 8.4* 9.2*  HCT 26.8*   < > 28.2*   < > 27.2*   < > 28.2*   < > 27.5*   < > 26.4*   < > 24.0* 20.0* 28.6* 27.5* 27.0*  MCV 98.5   < > 97.6   < > 98.2   < > 97.6  --  97.5  --  96.7  --   --   --  97.6 95.8  --   PLT 91*   < > 91*   < > 90*   < > 92*  --  92*  --  95*  --   --   --  97* 92*  --    < > = values in this interval not displayed.    Basic Metabolic Panel: Recent Labs  Lab 08/24/19 0352 08/24/19 0401 08/24/19 1805 08/24/19 1817 08/25/19 0445 08/25/19 0452 08/26/19 0320 08/26/19 0458 08/26/19 1045 08/26/19 1652 08/26/19 2353 08/27/19 0007 08/27/19 0456  NA 144   < > 143   < > 148*   < > 147*   < > 150* 148* 151* 146* 148*  K 3.9   < > 4.2   < > 4.1   < > 4.4   < > 4.2 4.2 2.9* 4.1 4.3  CL 109  --  111  --  113*  --  114*  --   --   --   --  112*  --   CO2 24  --  23  --  25  --  25  --   --   --   --  24  --   GLUCOSE 176*  --  158*  --  155*  --  158*  --   --   --   --  177*  --   BUN 38*  --  38*  --  39*  --  37*  --   --   --   --  38*  --   CREATININE 0.35*  --  0.43*  --  0.43*  --  0.48*  --   --   --   --  0.45*  --   CALCIUM 9.0  --  8.9  --  9.3  --  9.4  --   --   --   --  9.2  --   MG 1.8  --   --   --  2.0  --   --   --   --   --   --   --   --    < > = values in this interval not displayed.   GFR: Estimated Creatinine Clearance: 166.3 mL/min (A) (by C-G formula based on  SCr of 0.45 mg/dL (L)). Recent Labs  Lab 08/24/19 0352 08/24/19 1213 08/25/19 0445 08/25/19 2328 08/26/19 0306 08/26/19 1646 08/27/19 0007 08/27/19 0448  WBC 4.6   < > 4.5   < > 5.2 6.0 6.7 6.4  LATICACIDVEN 1.1  --  1.1  --  1.1  --   --  1.1   < > = values in this interval not displayed.    Liver Function Tests: Recent Labs  Lab 08/21/19 0203 08/22/19 0336 08/23/19 0335 08/24/19 0352 08/26/19 0320  AST _0 43*  ALT _1 65*  ALKPHOS 50 46 52 54 68  BILITOT 0.8 0.8 0.8 0.8 1.0  PROT 5.9* 6.0* 6.1* 5.9* 6.3*  ALBUMIN 2.8* 3.2* 3.0* 2.6* 3.4*   No results for input(s): LIPASE, AMYLASE in the last 168 hours. No results for input(s): AMMONIA in the last 168 hours.  ABG    Component Value Date/Time   PHART 7.377 08/27/2019 0456   PCO2ART 42.8 08/27/2019 0456   PO2ART 51.0 (L) 08/27/2019 0456   HCO3 25.1 08/27/2019 0456   TCO2 26 08/27/2019 0456   ACIDBASEDEF 7.0 (H) 08/26/2019 2353   O2SAT 69.0 08/27/2019 0500     Coagulation Profile: Recent Labs  Lab 08/21/19 0203 08/22/19 0336 08/23/19 0335 08/24/19 0352  INR 1.1 1.2 1.3* 1.3*  Cardiac Enzymes: No results for input(s): CKTOTAL, CKMB, CKMBINDEX, TROPONINI in the last 168 hours.  HbA1C: Hgb A1c MFr Bld  Date/Time Value Ref Range Status  07/24/2019 04:50 AM 6.7 (H) 4.8 - 5.6 % Final    Comment:    (NOTE) Pre diabetes:          5.7%-6.4% Diabetes:              >6.4% Glycemic control for   <7.0% adults with diabetes   09/30/2017 05:36 AM 5.7 (H) 4.8 - 5.6 % Final    Comment:    (NOTE)         Prediabetes: 5.7 - 6.4         Diabetes: >6.4         Glycemic control for adults with diabetes: <7.0     CBG: Recent Labs  Lab 08/26/19 2025 08/26/19 2351 08/27/19 0454 08/27/19 0739 08/27/19 0822  GLUCAP 138* 143* 164* 135* 162*     Critical care time:     CRITICAL CARE   The patient is critically ill with multiple organ systems failure and requires high complexity  decision making for assessment and support, frequent evaluation and titration of therapies, application of advanced monitoring technologies and extensive interpretation of multiple databases. Critical Care Time devoted to patient care services described in this note independent of APP/resident time (if applicable)  is 32 minutes.   Erskine Emery MD [facility] 08/27/2019 8:56 AM Personal pager: [phone] If unanswered, please page CCM On-call: [phone]    08/27/2019, 8:56 AM

## 2019-08-27 NOTE — Progress Notes (Addendum)
Nutrition Follow up  DOCUMENTATION CODES:   Obesity unspecified  INTERVENTION:   Continue tube feeding:  -Pivot 1.5 @ 75 ml/hr via Cortrak (1800 ml)  -60 ml Prostat BID -Free water flushes 300 ml Q8 hours-per CCM  At goal rate TF provides: 3100 kcals, 229 grams protein, 1350 ml free water (2250 ml with flushes). Meets 100% of needs.   NUTRITION DIAGNOSIS:   Increased nutrient needs related to acute illness(COVID PNA) as evidenced by estimated needs.  Ongoing  GOAL:   Patient will meet greater than or equal to 90% of their needs   Addressed via TF  MONITOR:   Diet advancement, Skin, TF tolerance, Weight trends, Labs, I & O's  REASON FOR ASSESSMENT:   Consult Enteral/tube feeding initiation and management  ASSESSMENT:   Patient with PMH significant for GERD, HTN, and asthma. Presents this admission with COVID-19 PNA.   12/14- admit 12/23- chest xray revealed pneumomediastinum  12/24- developed bilateral pneumothoraces  12/26- worsening hypoxemia, intubated  12/27- VV fem/fem ECMO cannulation  12/29- trach  1/5- nasal bleeding- packing per CCM 1/7- nasal packing per ENT 1/8- trach exchanged, ECMO circuit changed   RD working remotely.   Plan additional venous drainage cannula in RIJ tomorrow.   On VV ECMO (day 24). Paralytic restarted. Requiring daily therapeutic bronchs (found mucus plug with no clots today). Up 20 kg over the last week, IV lasix started. Hypernatremia persists. Tolerating TF at goal. Noted new stage II wound at bridle site in L nare. Cortrak RD assessed wound at bedside- likely due to ongoing nasal bleeding and treatments. Bridle shows to have adequate slack and is in good position. Recommend keeping bridle straight under nose and placing gauze under clip when possible.   Admission weight: 136.1 kg  Current weight: 149.5 kg kg  Patient requiring ventilator support via trach MV: 1.6 L/min Temp (24hrs), Avg:98.2 F (36.8 C), Min:97.8 F  (36.6 C), Max:98.9 F (37.2 C)   I/O: +25,713 ml since 1/5 UOP: 3,280 ml x 24 hrs  Stool: 600 ml x 24 hrs  Chest tubes: 550 ml x 24 hrs   Drips: albumin, nimbex, precedex, 250 mg lasix in D5 @ 4 ml/hr, versed Medications: 500 mg Vit C daily, SS novolog, 220 mg zinc sulfate daily Labs: Na 148 (H) CBG 134-177  Diet Order:   Diet Order            Diet NPO time specified  Diet effective midnight              EDUCATION NEEDS:   Not appropriate for education at this time  Skin:  Skin Assessment: Skin Integrity Issues: Skin Integrity Issues:: Other (Comment), Stage II Stage II: L nare- from Cortrak Other: Blister- L arm MASD- anus  Last BM:  1/18  Height:   Ht Readings from Last 1 Encounters:  08/09/19 5' 8"  (1.727 m)    Weight:   Wt Readings from Last 1 Encounters:  08/27/19 (!) 149.5 kg    Ideal Body Weight:  70 kg  BMI:  Body mass index is 50.11 kg/m.  Estimated Nutritional Needs:   Kcal:  6063-0160 kcal (25-30 kcal/kg)  Protein:  180-215 grams  Fluid:  >/= 2.5 L/day   [name] RD, LDN Clinical Nutrition Pager # [phone]

## 2019-08-28 ENCOUNTER — Inpatient Hospital Stay (HOSPITAL_COMMUNITY): Payer: Managed Care, Other (non HMO)

## 2019-08-28 ENCOUNTER — Encounter (HOSPITAL_COMMUNITY)
Admission: EM | Disposition: A | Discharge status: Rehabilitation Facility | Payer: Self-pay | Source: Home / Self Care | Attending: Cardiothoracic Surgery

## 2019-08-28 ENCOUNTER — Inpatient Hospital Stay (HOSPITAL_COMMUNITY): Payer: Managed Care, Other (non HMO) | Admitting: Certified Registered"

## 2019-08-28 DIAGNOSIS — Z8616 Personal history of COVID-19: Secondary | ICD-10-CM

## 2019-08-28 DIAGNOSIS — R0603 Acute respiratory distress: Secondary | ICD-10-CM

## 2019-08-28 HISTORY — PX: TEE WITHOUT CARDIOVERSION: SHX5443

## 2019-08-28 HISTORY — PX: CANNULATION FOR ECMO (EXTRACORPOREAL MEMBRANE OXYGENATION): SHX6796

## 2019-08-28 LAB — POCT I-STAT 7, (LYTES, BLD GAS, ICA,H+H)
Acid-Base Excess: 1 mmol/L (ref 0.0–2.0)
Acid-base deficit: 2 mmol/L (ref 0.0–2.0)
Bicarbonate: 26 mmol/L (ref 20.0–28.0)
Bicarbonate: 25 mmol/L (ref 20.0–28.0)
Bicarbonate: 23.2 mmol/L (ref 20.0–28.0)
Bicarbonate: 24.1 mmol/L (ref 20.0–28.0)
Calcium, Ion: 1.33 mmol/L (ref 1.15–1.40)
Calcium, Ion: 1.32 mmol/L (ref 1.15–1.40)
Calcium, Ion: 1.27 mmol/L (ref 1.15–1.40)
Calcium, Ion: 1.29 mmol/L (ref 1.15–1.40)
HCT: 26 % — ABNORMAL LOW (ref 39.0–52.0)
HCT: 26 % — ABNORMAL LOW (ref 39.0–52.0)
HCT: 25 % — ABNORMAL LOW (ref 39.0–52.0)
HCT: 28 % — ABNORMAL LOW (ref 39.0–52.0)
Hemoglobin: 8.8 g/dL — ABNORMAL LOW (ref 13.0–17.0)
Hemoglobin: 8.8 g/dL — ABNORMAL LOW (ref 13.0–17.0)
Hemoglobin: 8.5 g/dL — ABNORMAL LOW (ref 13.0–17.0)
Hemoglobin: 9.5 g/dL — ABNORMAL LOW (ref 13.0–17.0)
O2 Saturation: 85 %
O2 Saturation: 83 %
O2 Saturation: 78 %
O2 Saturation: 82 %
Patient temperature: 36.4
Patient temperature: 97.6
Patient temperature: 97.7
Potassium: 3.9 mmol/L (ref 3.5–5.1)
Potassium: 3.8 mmol/L (ref 3.5–5.1)
Potassium: 3.3 mmol/L — ABNORMAL LOW (ref 3.5–5.1)
Potassium: 3.5 mmol/L (ref 3.5–5.1)
Sodium: 146 mmol/L — ABNORMAL HIGH (ref 135–145)
Sodium: 146 mmol/L — ABNORMAL HIGH (ref 135–145)
Sodium: 148 mmol/L — ABNORMAL HIGH (ref 135–145)
Sodium: 147 mmol/L — ABNORMAL HIGH (ref 135–145)
TCO2: 27 mmol/L (ref 22–32)
TCO2: 26 mmol/L (ref 22–32)
TCO2: 24 mmol/L (ref 22–32)
TCO2: 25 mmol/L (ref 22–32)
pCO2 arterial: 41.1 mmHg (ref 32.0–48.0)
pCO2 arterial: 40.1 mmHg (ref 32.0–48.0)
pCO2 arterial: 39.1 mmHg (ref 32.0–48.0)
pCO2 arterial: 36.8 mmHg (ref 32.0–48.0)
pH, Arterial: 7.405 (ref 7.350–7.450)
pH, Arterial: 7.4 (ref 7.350–7.450)
pH, Arterial: 7.381 (ref 7.350–7.450)
pH, Arterial: 7.423 (ref 7.350–7.450)
pO2, Arterial: 49 mmHg — ABNORMAL LOW (ref 83.0–108.0)
pO2, Arterial: 46 mmHg — ABNORMAL LOW (ref 83.0–108.0)
pO2, Arterial: 43 mmHg — ABNORMAL LOW (ref 83.0–108.0)
pO2, Arterial: 44 mmHg — ABNORMAL LOW (ref 83.0–108.0)

## 2019-08-28 LAB — BPAM RBC
Blood Product Expiration Date: 202102102359
Blood Product Expiration Date: 202102122359
Blood Product Expiration Date: 202102122359
Blood Product Expiration Date: 202102122359
Blood Product Expiration Date: 202102162359
ISSUE DATE / TIME: 202101181837
Unit Type and Rh: 5100
Unit Type and Rh: 5100
Unit Type and Rh: 5100
Unit Type and Rh: 5100
Unit Type and Rh: 5100

## 2019-08-28 LAB — CULTURE, RESPIRATORY W GRAM STAIN

## 2019-08-28 LAB — CBC WITH DIFFERENTIAL/PLATELET
Abs Immature Granulocytes: 2.12 10*3/uL — ABNORMAL HIGH (ref 0.00–0.07)
Basophils Absolute: 0.1 10*3/uL (ref 0.0–0.1)
Basophils Relative: 1 %
Eosinophils Absolute: 0.3 10*3/uL (ref 0.0–0.5)
Eosinophils Relative: 3 %
HCT: 28.7 % — ABNORMAL LOW (ref 39.0–52.0)
Hemoglobin: 8.6 g/dL — ABNORMAL LOW (ref 13.0–17.0)
Immature Granulocytes: 25 %
Lymphocytes Relative: 15 %
Lymphs Abs: 1.3 10*3/uL (ref 0.7–4.0)
MCH: 28.7 pg (ref 26.0–34.0)
MCHC: 30 g/dL (ref 30.0–36.0)
MCV: 95.7 fL (ref 80.0–100.0)
Monocytes Absolute: 1.1 10*3/uL — ABNORMAL HIGH (ref 0.1–1.0)
Monocytes Relative: 13 %
Neutro Abs: 3.6 10*3/uL (ref 1.7–7.7)
Neutrophils Relative %: 43 %
Platelets: 101 10*3/uL — ABNORMAL LOW (ref 150–400)
RBC: 3 MIL/uL — ABNORMAL LOW (ref 4.22–5.81)
RDW: 18.2 % — ABNORMAL HIGH (ref 11.5–15.5)
WBC: 8.5 10*3/uL (ref 4.0–10.5)
nRBC: 2.7 % — ABNORMAL HIGH (ref 0.0–0.2)

## 2019-08-28 LAB — TYPE AND SCREEN
ABO/RH(D): O POS
Antibody Screen: NEGATIVE
Unit division: 0
Unit division: 0
Unit division: 0
Unit division: 0
Unit division: 0

## 2019-08-28 LAB — CBC
HCT: 29.2 % — ABNORMAL LOW (ref 39.0–52.0)
Hemoglobin: 8.8 g/dL — ABNORMAL LOW (ref 13.0–17.0)
MCH: 28.7 pg (ref 26.0–34.0)
MCHC: 30.1 g/dL (ref 30.0–36.0)
MCV: 95.1 fL (ref 80.0–100.0)
Platelets: 95 10*3/uL — ABNORMAL LOW (ref 150–400)
RBC: 3.07 MIL/uL — ABNORMAL LOW (ref 4.22–5.81)
RDW: 17.9 % — ABNORMAL HIGH (ref 11.5–15.5)
WBC: 10 10*3/uL (ref 4.0–10.5)
nRBC: 3.3 % — ABNORMAL HIGH (ref 0.0–0.2)

## 2019-08-28 LAB — GLUCOSE, CAPILLARY
Glucose-Capillary: 109 mg/dL — ABNORMAL HIGH (ref 70–99)
Glucose-Capillary: 111 mg/dL — ABNORMAL HIGH (ref 70–99)
Glucose-Capillary: 161 mg/dL — ABNORMAL HIGH (ref 70–99)
Glucose-Capillary: 163 mg/dL — ABNORMAL HIGH (ref 70–99)
Glucose-Capillary: 163 mg/dL — ABNORMAL HIGH (ref 70–99)
Glucose-Capillary: 167 mg/dL — ABNORMAL HIGH (ref 70–99)
Glucose-Capillary: 96 mg/dL (ref 70–99)

## 2019-08-28 LAB — BASIC METABOLIC PANEL
Anion gap: 11 (ref 5–15)
Anion gap: 13 (ref 5–15)
BUN: 39 mg/dL — ABNORMAL HIGH (ref 6–20)
BUN: 40 mg/dL — ABNORMAL HIGH (ref 6–20)
CO2: 23 mmol/L (ref 22–32)
CO2: 24 mmol/L (ref 22–32)
Calcium: 8.9 mg/dL (ref 8.9–10.3)
Calcium: 9.1 mg/dL (ref 8.9–10.3)
Chloride: 108 mmol/L (ref 98–111)
Chloride: 109 mmol/L (ref 98–111)
Creatinine, Ser: 0.63 mg/dL (ref 0.61–1.24)
Creatinine, Ser: 0.62 mg/dL (ref 0.61–1.24)
GFR calc Af Amer: >60 mL/min (ref >60)
GFR calc Af Amer: >60 mL/min (ref >60)
GFR calc non Af Amer: >60 mL/min (ref >60)
GFR calc non Af Amer: >60 mL/min (ref >60)
Glucose, Bld: 187 mg/dL — ABNORMAL HIGH (ref 70–99)
Glucose, Bld: 114 mg/dL — ABNORMAL HIGH (ref 70–99)
Potassium: 3.7 mmol/L (ref 3.5–5.1)
Potassium: 3.6 mmol/L (ref 3.5–5.1)
Sodium: 142 mmol/L (ref 135–145)
Sodium: 146 mmol/L — ABNORMAL HIGH (ref 135–145)

## 2019-08-28 LAB — PROTIME-INR
INR: 1.4 — ABNORMAL HIGH (ref 0.8–1.2)
Prothrombin Time: 17 seconds — ABNORMAL HIGH (ref 11.4–15.2)

## 2019-08-28 LAB — POCT I-STAT EG7
Acid-base deficit: 2 mmol/L (ref 0.0–2.0)
Bicarbonate: 23.7 mmol/L (ref 20.0–28.0)
Calcium, Ion: 1.23 mmol/L (ref 1.15–1.40)
HCT: 25 % — ABNORMAL LOW (ref 39.0–52.0)
Hemoglobin: 8.5 g/dL — ABNORMAL LOW (ref 13.0–17.0)
O2 Saturation: 45 %
Patient temperature: 37
Potassium: 3.3 mmol/L — ABNORMAL LOW (ref 3.5–5.1)
Sodium: 149 mmol/L — ABNORMAL HIGH (ref 135–145)
TCO2: 25 mmol/L (ref 22–32)
pCO2, Ven: 42.2 mmHg — ABNORMAL LOW (ref 44.0–60.0)
pH, Ven: 7.358 (ref 7.250–7.430)
pO2, Ven: 26 mmHg — CL (ref 32.0–45.0)

## 2019-08-28 LAB — COOXEMETRY PANEL
Carboxyhemoglobin: 4.1 % — ABNORMAL HIGH (ref 0.5–1.5)
Methemoglobin: 1.2 % (ref 0.0–1.5)
O2 Saturation: 75.8 %
Total hemoglobin: 8.1 g/dL — ABNORMAL LOW (ref 12.0–16.0)

## 2019-08-28 LAB — LACTATE DEHYDROGENASE: LDH: 349 U/L — ABNORMAL HIGH (ref 98–192)

## 2019-08-28 LAB — HEPARIN LEVEL (UNFRACTIONATED)
Heparin Unfractionated: 0.3 IU/mL (ref 0.30–0.70)
Heparin Unfractionated: 0.13 IU/mL — ABNORMAL LOW (ref 0.30–0.70)

## 2019-08-28 LAB — ECHO INTRAOPERATIVE TEE
Height: 68 in
Weight: 5516.79 oz

## 2019-08-28 LAB — LACTIC ACID, PLASMA: Lactic Acid, Venous: 1.2 mmol/L (ref 0.5–1.9)

## 2019-08-28 LAB — APTT
aPTT: 102 seconds — ABNORMAL HIGH (ref 24–36)
aPTT: 59 seconds — ABNORMAL HIGH (ref 24–36)

## 2019-08-28 SURGERY — CANNULATION FOR ECMO (EXTRACORPOREAL MEMBRANE OXYGENATION)
Anesthesia: General | Site: Chest

## 2019-08-28 MED ORDER — LIDOCAINE HCL (PF) 1 % IJ SOLN
INTRAMUSCULAR | Status: AC
  Filled 2019-08-28: qty 30

## 2019-08-28 MED ORDER — SODIUM CHLORIDE 0.9 % IV SOLN
INTRAVENOUS | Status: AC
  Filled 2019-08-28: qty 1.2

## 2019-08-28 MED ORDER — IPRATROPIUM-ALBUTEROL 0.5-2.5 (3) MG/3ML IN SOLN
3.0000 mL | RESPIRATORY_TRACT | Status: DC
  Administered 2019-08-28 – 2019-09-06 (×53): 3 mL via RESPIRATORY_TRACT
  Filled 2019-08-28 (×52): qty 3

## 2019-08-28 MED ORDER — ACETYLCYSTEINE 10 % IN SOLN
4.0000 mL | RESPIRATORY_TRACT | Status: DC
  Filled 2019-08-28 (×2): qty 4

## 2019-08-28 MED ORDER — SODIUM BICARBONATE 650 MG PO TABS
650.0000 mg | ORAL_TABLET | Freq: Once | ORAL | Status: AC
  Administered 2019-08-28: 650 mg
  Filled 2019-08-28: qty 1

## 2019-08-28 MED ORDER — LIDOCAINE HCL (PF) 1 % IJ SOLN
INTRAMUSCULAR | Status: AC
  Filled 2019-08-28: qty 5

## 2019-08-28 MED ORDER — ACETYLCYSTEINE 20 % IN SOLN
4.0000 mL | RESPIRATORY_TRACT | Status: AC
  Administered 2019-08-28 – 2019-08-30 (×10): 4 mL via RESPIRATORY_TRACT
  Filled 2019-08-28 (×12): qty 4

## 2019-08-28 MED ORDER — HEPARIN (PORCINE) 25000 UT/250ML-% IV SOLN
2600.0000 [IU]/h | INTRAVENOUS | Status: DC
  Administered 2019-08-28: 1800 [IU]/h via INTRAVENOUS
  Administered 2019-08-28: 11:00:00 2700 [IU]/h via INTRAVENOUS
  Administered 2019-08-29: 22:00:00 2750 [IU]/h via INTRAVENOUS
  Administered 2019-08-29: 03:00:00 2600 [IU]/h via INTRAVENOUS
  Administered 2019-08-29: 2700 [IU]/h via INTRAVENOUS
  Administered 2019-08-30 – 2019-09-01 (×7): 2750 [IU]/h via INTRAVENOUS
  Administered 2019-09-02: 22:00:00 2600 [IU]/h via INTRAVENOUS
  Administered 2019-09-02 (×2): 2650 [IU]/h via INTRAVENOUS
  Administered 2019-09-03 – 2019-09-05 (×7): 2600 [IU]/h via INTRAVENOUS
  Filled 2019-08-28 (×22): qty 250

## 2019-08-28 MED ORDER — PANCRELIPASE (LIP-PROT-AMYL) 10440-39150 UNITS PO TABS
20880.0000 [IU] | ORAL_TABLET | Freq: Once | ORAL | Status: AC
  Administered 2019-08-28: 20880 [IU]
  Filled 2019-08-28: qty 2

## 2019-08-28 MED ORDER — POTASSIUM CHLORIDE 20 MEQ/15ML (10%) PO SOLN
40.0000 meq | Freq: Once | ORAL | Status: AC
  Administered 2019-08-28: 40 meq
  Filled 2019-08-28: qty 30

## 2019-08-28 MED ORDER — VANCOMYCIN HCL 1500 MG/300ML IV SOLN
1500.0000 mg | Freq: Once | INTRAVENOUS | Status: AC
  Administered 2019-08-28: 1500 mg via INTRAVENOUS
  Filled 2019-08-28: qty 300

## 2019-08-28 MED ORDER — SODIUM CHLORIDE 0.9 % IV SOLN
INTRAVENOUS | Status: DC | PRN
  Administered 2019-08-28 (×2): 500 mL

## 2019-08-28 MED ORDER — SODIUM CHLORIDE 0.9 % IV SOLN: INTRAVENOUS | Status: DC | PRN

## 2019-08-28 MED ORDER — 0.9 % SODIUM CHLORIDE (POUR BTL) OPTIME
TOPICAL | Status: DC | PRN
  Administered 2019-08-28: 1000 mL

## 2019-08-28 MED ORDER — LACTATED RINGERS IV SOLN: INTRAVENOUS | Status: DC | PRN

## 2019-08-28 MED ORDER — CLEVIDIPINE 50 MG/100ML IV EMUL: 1.0000 mg/h | INTRAVENOUS | Status: DC

## 2019-08-28 MED ORDER — PROPOFOL 10 MG/ML IV BOLUS
INTRAVENOUS | Status: DC | PRN
  Administered 2019-08-28 (×3): 50 mg via INTRAVENOUS

## 2019-08-28 MED ORDER — CLEVIDIPINE 50 MG/100ML IV EMUL
1.0000 mg/h | INTRAVENOUS | Status: AC
  Administered 2019-08-28: 15:00:00 2 mg/h via INTRAVENOUS
  Filled 2019-08-28 (×2): qty 100

## 2019-08-28 SURGICAL SUPPLY — 78 items
ANCHOR CATH FOLEY SECURE (MISCELLANEOUS) ×14 IMPLANT
APPLIER CLIP 13 LRG OPEN (CLIP)
APR CLP LRG 13 20 CLIP (CLIP)
BAG BANDED W/RUBBER/TAPE 36X54 (MISCELLANEOUS) ×2 IMPLANT
BAG DECANTER FOR FLEXI CONT (MISCELLANEOUS) ×6 IMPLANT
BAG EQP BAND 135X91 W/RBR TAPE (MISCELLANEOUS) ×2
BLADE CLIPPER SURG (BLADE) ×2 IMPLANT
BLADE SURG 11 STRL SS (BLADE) ×2 IMPLANT
CANNULA ADULT BIO-MEDICUS 21FR (MISCELLANEOUS) ×2 IMPLANT
CANNULA FEMORAL ART 14 SM (MISCELLANEOUS) IMPLANT
CLIP APPLIE 13 LRG OPEN (CLIP) IMPLANT
CLIP VESOCCLUDE LG 6/CT (CLIP) ×2 IMPLANT
CONN 3/8X3/8 STRAIGHT W/LL (MISCELLANEOUS) ×2 IMPLANT
CONN Y 3/8X3/8X3/8  BEN (MISCELLANEOUS)
CONN Y 3/8X3/8X3/8 BEN (MISCELLANEOUS) IMPLANT
COVER DOME SNAP 22 D (MISCELLANEOUS) ×2 IMPLANT
COVER PROBE W GEL 5X96 (DRAPES) ×4 IMPLANT
COVER SURGICAL LIGHT HANDLE (MISCELLANEOUS) ×2 IMPLANT
DRAPE C-ARM 42X72 X-RAY (DRAPES) ×4 IMPLANT
DRAPE CARDIOVASCULAR INCISE (DRAPES) ×4
DRAPE CV SPLIT W-CLR ANES SCRN (DRAPES) ×2 IMPLANT
DRAPE INCISE IOBAN 66X45 STRL (DRAPES) ×2 IMPLANT
DRAPE PERI GROIN 82X75IN TIB (DRAPES) ×2 IMPLANT
DRAPE SLUSH/WARMER DISC (DRAPES) ×4 IMPLANT
DRAPE SRG 135X102X78XABS (DRAPES) ×2 IMPLANT
DRSG TEGADERM 4X4.5 CHG (GAUZE/BANDAGES/DRESSINGS) ×6 IMPLANT
ELECT CAUTERY BLADE 6.4 (BLADE) ×4 IMPLANT
ELECT REM PT RETURN 9FT ADLT (ELECTROSURGICAL) ×4
ELECTRODE REM PT RTRN 9FT ADLT (ELECTROSURGICAL) ×2 IMPLANT
FELT TEFLON 1X6 (MISCELLANEOUS) IMPLANT
FEMORAL VENOUS CANN RAP (CANNULA) IMPLANT
GAUZE SPONGE 2X2 8PLY STRL LF (GAUZE/BANDAGES/DRESSINGS) IMPLANT
GAUZE SPONGE 4X4 12PLY STRL LF (GAUZE/BANDAGES/DRESSINGS) ×2 IMPLANT
GLOVE BIO SURGEON STRL SZ 6.5 (GLOVE) ×2 IMPLANT
GLOVE BIO SURGEON STRL SZ7.5 (GLOVE) ×6 IMPLANT
GLOVE BIO SURGEONS STRL SZ 6.5 (GLOVE) ×2
GLOVE BIOGEL PI IND STRL 6.5 (GLOVE) IMPLANT
GLOVE BIOGEL PI IND STRL 7.0 (GLOVE) IMPLANT
GLOVE BIOGEL PI INDICATOR 6.5 (GLOVE) ×2
GLOVE BIOGEL PI INDICATOR 7.0 (GLOVE) ×4
GOWN STRL REUS W/ TWL LRG LVL3 (GOWN DISPOSABLE) ×8 IMPLANT
GOWN STRL REUS W/ TWL XL LVL3 (GOWN DISPOSABLE) IMPLANT
GOWN STRL REUS W/TWL LRG LVL3 (GOWN DISPOSABLE) ×20
GOWN STRL REUS W/TWL XL LVL3 (GOWN DISPOSABLE) ×4
GUIDEWIRE SAF TJ AMPL .035X180 (WIRE) ×2 IMPLANT
KIT BASIN OR (CUSTOM PROCEDURE TRAY) ×4 IMPLANT
KIT BIO-MEDICUS INSERTION (MISCELLANEOUS) ×2 IMPLANT
KIT DILATOR VASC 18G NDL (KITS) ×4 IMPLANT
NEEDLE 22X1 1/2 (OR ONLY) (NEEDLE) ×2 IMPLANT
NS IRRIG 1000ML POUR BTL (IV SOLUTION) ×6 IMPLANT
PACK GENERAL/GYN (CUSTOM PROCEDURE TRAY) ×4 IMPLANT
PAD ARMBOARD 7.5X6 YLW CONV (MISCELLANEOUS) ×4 IMPLANT
SCRUB POVIDONE IODINE 4 OZ (MISCELLANEOUS) ×4 IMPLANT
SET CANNULATION TOURNIQUET (MISCELLANEOUS) IMPLANT
SET MICROPUNCTURE 5F STIFF (MISCELLANEOUS) ×2 IMPLANT
SET PRESSURE INFUSION 24 (MISCELLANEOUS) ×4 IMPLANT
SHEATH AVANTI 11CM 5FR (SHEATH) ×4 IMPLANT
SHEATH PINNACLE 6F 10CM (SHEATH) ×2 IMPLANT
SOL PREP POV-IOD 4OZ 10% (MISCELLANEOUS) ×4 IMPLANT
SPONGE GAUZE 2X2 STER 10/PKG (GAUZE/BANDAGES/DRESSINGS) ×2
SPONGE LAP 18X18 X RAY DECT (DISPOSABLE) ×2 IMPLANT
SUT ETHILON 3 0 PS 1 (SUTURE) ×2 IMPLANT
SUT PROLENE 5 0 C 1 36 (SUTURE) IMPLANT
SUT PROLENE 6 0 C 1 30 (SUTURE) IMPLANT
SUT SILK  1 MH (SUTURE) ×18
SUT SILK 1 MH (SUTURE) ×12 IMPLANT
SUT SILK 2 0 SH CR/8 (SUTURE) ×2 IMPLANT
SYR 10ML LL (SYRINGE) ×6 IMPLANT
SYR 30ML LL (SYRINGE) ×2 IMPLANT
SYR 30ML SLIP (SYRINGE) ×2 IMPLANT
SYR CONTROL 10ML LL (SYRINGE) IMPLANT
TAPE CLOTH SURG 4X10 WHT LF (GAUZE/BANDAGES/DRESSINGS) ×2 IMPLANT
TOWEL GREEN STERILE (TOWEL DISPOSABLE) ×6 IMPLANT
TOWEL GREEN STERILE FF (TOWEL DISPOSABLE) ×2 IMPLANT
TRAY FOLEY MTR SLVR 16FR STAT (SET/KITS/TRAYS/PACK) ×2 IMPLANT
WATER STERILE IRR 1000ML POUR (IV SOLUTION) ×6 IMPLANT
WIRE J 3MM .035X145CM (WIRE) ×4 IMPLANT
WIRE TORQFLEX AUST .018X40CM (WIRE) ×2 IMPLANT

## 2019-08-28 NOTE — Progress Notes (Signed)
Pt returned from [place]

## 2019-08-28 NOTE — Brief Op Note (Signed)
08/28/2019  4:26 PM  PATIENT:  [name]  44 y.o. male  PRE-OPERATIVE DIAGNOSIS:  ECMO ARDS  POST-OPERATIVE DIAGNOSIS:  ECMO ARDS  PROCEDURE:  Cannulation for VV ECMO with a 51F Med[employer] venous cannula to the SVC  Placement of double lumen central line to the L subclavian vein  SURGEON:  Surgeon(s) and Role:    Ivin Poot, MD - Primary    * Bensimhon, Shaune Pascal, MD - Assisting  PHYSICIAN ASSISTANT: none  ASSISTANTS:  D Bensimhon MD   ANESTHESIA:   general  EBL:  100 mL   BLOOD ADMINISTERED 1 unit PRBC  DRAINS: none   LOCAL MEDICATIONS USED:  NONE  SPECIMEN:  No Specimen  DISPOSITION OF SPECIMEN:  N/A  COUNTS:  YES  TOURNIQUET:  * No tourniquets in log *  DICTATION: .Dragon Dictation  PLAN OF CARE: transfer to ICU  PATIENT DISPOSITION:  ICU - intubated and critically ill.   Delay start of Pharmacological VTE agent (>24hrs) due to surgical blood loss or risk of bleeding: yes

## 2019-08-28 NOTE — Progress Notes (Signed)
ECMO Specialist Goal for the shift is to establish and maintain good ventilation and oxygenation via utilization of ECMO therapy using appropriate settings to achieve adequate flow goals to meet systemic and myocardial oxygen demands to optimize tissue oxygenation/perfusion.   [name] [name] L. Tamala Julian, BS, RRT, RCP

## 2019-08-28 NOTE — Progress Notes (Signed)
Patient transported from 2H04 to OR16 on battery power and oxygen tank.  Upon arrival to the OR, circuit was connected to wall power and oxygen.  Transport was uneventful.  A 21 Fr. Venous drainage cannula was placed in the right IJ by Dr. Prescott Gum.  The ECMO circuit was clamped off at 1501 and the new cannula was wyed into the venous drainage.  ECMO flow was resumed at 1503.  Patient continued with SpO2 in the low 80s, but otherwise stable.  Cannulas were secured.  Patient was transported from [place] to 2H04 on battery power and oxygen tank.  Upon arrival to the patient room, power was hooked up to a red outlet and wall oxygen source.  Transport was uneventful.  A Centrimag flow probe was placed on the IVC drainage cannula to ensure adequate drainage from both cannula sites.

## 2019-08-28 NOTE — Progress Notes (Signed)
PT was transported from OR to 4O97 without complications.

## 2019-08-28 NOTE — Anesthesia Postprocedure Evaluation (Addendum)
Anesthesia Post Note  Patient: [name]  Procedure(s) Performed: CANNULATION FOR VV ECMO (EXTRACORPOREAL MEMBRANE OXYGENATION) (N/A Chest) TRANSESOPHAGEAL ECHOCARDIOGRAM (TEE) (N/A )     Patient location during evaluation: SICU Anesthesia Type: General Level of consciousness: sedated Pain management: pain level controlled Vital Signs Assessment: post-procedure vital signs reviewed and stable Respiratory status: patient remains intubated per anesthesia plan and respiratory function unstable Cardiovascular status: stable Postop Assessment: no apparent nausea or vomiting Anesthetic complications: no    Last Vitals:  Vitals:   08/28/19 1200 08/28/19 1627  BP:    Pulse: 71 73  Resp: 15   Temp: (!) 36.3 C   SpO2: (!) 87% (!) 78%    Last Pain:  Vitals:   08/28/19 1200  TempSrc: Axillary  PainSc:                  Sharen Youngren,W. EDMOND

## 2019-08-28 NOTE — Progress Notes (Signed)
Patient bronched at bedside by Agarwala MD. 8 of Versed and 8 of Dilaudid given prior to procedure.

## 2019-08-28 NOTE — Progress Notes (Signed)
RT assisted MD with bronch. No samples taken, just a clean out. Patient tolerated fairly well, SAT dropped to 78% at one point. No bleeding noted today. MD wants to add mucomyst and CPT

## 2019-08-28 NOTE — Anesthesia Preprocedure Evaluation (Addendum)
Anesthesia Evaluation  Patient identified by MRN, date of birth, ID band Patient unresponsive    Reviewed: Allergy & Precautions, H&P , NPO status , Patient's Chart, lab work & pertinent test results, reviewed documented beta blocker date and time   Airway Mallampati: Intubated       Dental no notable dental hx. (+) Teeth Intact, Dental Advisory Given   Pulmonary asthma , pneumonia,    Pulmonary exam normal breath sounds clear to auscultation       Cardiovascular hypertension, Pt. on medications and Pt. on home beta blockers  Rhythm:Regular Rate:Normal     Neuro/Psych  Headaches, negative psych ROS   GI/Hepatic Neg liver ROS, GERD  Medicated,  Endo/Other  negative endocrine ROS  Renal/GU negative Renal ROS  negative genitourinary   Musculoskeletal  (+) Arthritis , Osteoarthritis,    Abdominal   Peds  Hematology negative hematology ROS (+)   Anesthesia Other Findings   Reproductive/Obstetrics negative OB ROS                             Anesthesia Physical Anesthesia Plan  ASA: IV  Anesthesia Plan: General   Post-op Pain Management:    Induction: Intravenous  PONV Risk Score and Plan: 2 and Treatment may vary due to age or medical condition  Airway Management Planned: Oral ETT  Additional Equipment: TEE  Intra-op Plan:   Post-operative Plan: Post-operative intubation/ventilation  Informed Consent: I have reviewed the patients History and Physical, chart, labs and discussed the procedure including the risks, benefits and alternatives for the proposed anesthesia with the patient or authorized representative who has indicated his/her understanding and acceptance.     Dental advisory given  Plan Discussed with: CRNA  Anesthesia Plan Comments: (TEE for monitoring only)       Anesthesia Quick Evaluation

## 2019-08-28 NOTE — Transfer of Care (Signed)
Immediate Anesthesia Transfer of Care Note  Patient: [name]  Procedure(s) Performed: CANNULATION FOR VV ECMO (EXTRACORPOREAL MEMBRANE OXYGENATION) (N/A Chest) TRANSESOPHAGEAL ECHOCARDIOGRAM (TEE) (N/A )  Patient Location: ICU  Anesthesia Type:General  Level of Consciousness: sedated and Patient remains intubated per anesthesia plan  Airway & Oxygen Therapy: Patient remains intubated per anesthesia plan and Patient placed on Ventilator (see vital sign flow sheet for setting)  Post-op Assessment: Report given to RN, Post -op Vital signs reviewed and unstable, Anesthesiologist notified and SPO2 remains 70-80% as per pre-operatively.  Post vital signs: Reviewed and unstable.  Patient on VV ecmo, 100% fiO2 with 15 PEEP and NO.  Sats remain 70-80% and PO2 in 40s.  Dr. Prescott Gum aware.  No change from patient's baseline vitals.  Last Vitals:  Vitals Value Taken Time  BP    Temp    Pulse    Resp    SpO2      Last Pain:  Vitals:   08/28/19 1200  TempSrc: Axillary  PainSc:       Patients Stated Pain Goal: 0 (17/49/44 9675)  Complications: No apparent anesthesia complications

## 2019-08-28 NOTE — Anesthesia Procedure Notes (Signed)
Arterial Line Insertion Performed by: Renato Shin, CRNA, CRNA  Patient location: OR. Preanesthetic checklist: patient identified, IV checked, site marked, risks and benefits discussed, surgical consent, monitors and equipment checked, pre-op evaluation, timeout performed and anesthesia consent Right, radial was placed Catheter size: 20 G Hand hygiene performed , maximum sterile barriers used  and Seldinger technique used Allen's test indicative of satisfactory collateral circulation Attempts: 1 Procedure performed without using ultrasound guided technique. Following insertion, dressing applied and Biopatch. Post procedure assessment: normal and unchanged  Patient tolerated the procedure well with no immediate complications.

## 2019-08-28 NOTE — Progress Notes (Signed)
ANTICOAGULATION CONSULT NOTE  Pharmacy Consult for Heparin Indication: ECMO  No Known Allergies  Patient Measurements: Height: 5' 8"  (172.7 cm) Weight: (!) 344 lb 12.8 oz (156.4 kg) IBW/kg (Calculated) : 68.4 Heparin Dosing Weight: 100.7 kg  Vital Signs: Temp: 97.5 F (36.4 C) (01/20 0400) Temp Source: Axillary (01/20 0400) BP: 112/47 (01/20 0800) Pulse Rate: 74 (01/20 0800)  Labs: Recent Labs    08/26/19 1646 08/26/19 1652 08/27/19 0007 08/27/19 0007 08/27/19 0448 08/27/19 0456 08/27/19 1700 08/27/19 2107 08/28/19 0406 08/28/19 0406 08/28/19 0418 08/28/19 0754  HGB 7.9*   < > 8.5*   < > 8.4*   < > 8.7*   < > 8.6*   < > 8.8* 8.8*  HCT 26.4*   < > 28.6*   < > 27.5*   < > 29.4*   < > 28.7*  --  26.0* 26.0*  PLT 95*   < > 97*   < > 92*  --  96*  --  101*  --   --   --   APTT 93*  --   --   --  105*  --  97*  --   --   --   --   --   LABPROT  --   --   --   --   --   --   --   --  17.0*  --   --   --   INR  --   --   --   --   --   --   --   --  1.4*  --   --   --   HEPARINUNFRC 0.29*   < >  --   --  0.34  --  0.29*  --  0.30  --   --   --   CREATININE  --   --  0.45*  --   --   --  0.60*  --  0.63  --   --   --    < > = values in this interval not displayed.    Estimated Creatinine Clearance: 170.9 mL/min (by C-G formula based on SCr of 0.63 mg/dL).  Assessment: 44 yo male known COVID+ and ARDS VV ECMO. Heparin was restarted 1/5 (bivalirudin stopped as HIT test was negative).  Heparin level this morning is therapeutic at 0.3. CBC stable. Pt is scheduled to have additional SVC drain placed in OR today. Pt also due to nasal repacking today.   Goal of Therapy:  Heparin level: 0.3-0.5 units/mL APTT goal: 66-85 sec Monitor platelets by anticoagulation protocol: Yes   Plan:  -Reduce heparin infusion rate to 1800 units/hr per Dr. Prescott Gum prior to OR -F/u heparin infusion rate post-op -Heparin level q12 hours 5a/5p -Monitor CBC, and for s/sx of bleeding  Berenice Bouton, PharmD PGY1 Pharmacy Resident  Please check AMION for all [place] phone numbers After 10:00 PM, call [place] [phone]  08/28/2019  8:38 AM

## 2019-08-28 NOTE — Progress Notes (Signed)
Per Prescott Gum MD, keep heparin gtt at 20 for 4 hours (until 2030) and then titrate per pharmacy.

## 2019-08-28 NOTE — Progress Notes (Signed)
Laid patient back after standing for several hours. Patient's O2 sats dropped to 72% and BP dropped. Levophed started, patient's head raised some. Minimal response. Agarwala MD ordered to sit patient back up. O2 sats back up to 80s and BP much improved.

## 2019-08-28 NOTE — Progress Notes (Signed)
ANTICOAGULATION CONSULT NOTE  Pharmacy Consult for Heparin Indication: ECMO  No Known Allergies  Patient Measurements: Height: 5' 8"  (172.7 cm) Weight: (!) 344 lb 12.8 oz (156.4 kg) IBW/kg (Calculated) : 68.4 Heparin Dosing Weight: 100.7 kg  Vital Signs: Temp: 97.5 F (36.4 C) (01/20 0400) Temp Source: Axillary (01/20 0400) BP: 107/56 (01/20 0342) Pulse Rate: 80 (01/20 0515)  Labs: Recent Labs    08/26/19 1646 08/26/19 1652 08/27/19 0007 08/27/19 0007 08/27/19 0448 08/27/19 0456 08/27/19 1700 08/27/19 1700 08/27/19 2107 08/27/19 2107 08/28/19 0406 08/28/19 0418  HGB 7.9*   < > 8.5*   < > 8.4*   < > 8.7*   < > 8.8*   < > 8.6* 8.8*  HCT 26.4*   < > 28.6*   < > 27.5*   < > 29.4*   < > 26.0*  --  28.7* 26.0*  PLT 95*   < > 97*   < > 92*  --  96*  --   --   --  101*  --   APTT 93*  --   --   --  105*  --  97*  --   --   --   --   --   LABPROT  --   --   --   --   --   --   --   --   --   --  17.0*  --   INR  --   --   --   --   --   --   --   --   --   --  1.4*  --   HEPARINUNFRC 0.29*   < >  --   --  0.34  --  0.29*  --   --   --  0.30  --   CREATININE  --   --  0.45*  --   --   --  0.60*  --   --   --  0.63  --    < > = values in this interval not displayed.    Estimated Creatinine Clearance: 170.9 mL/min (by C-G formula based on SCr of 0.63 mg/dL).  Assessment: 44 yo male known COVID+ and ARDS VV ECMO. Heparin was restarted 1/5 (bivalirudin stopped as HIT test was negative).  Heparin level this evening is subtherapeutic, APTT remains at goal (following heparin levels as they are more accurate). Per RN nose beginning to ooze slightly - will need to watch.  1/20 AM update:  Heparin level within range Hgb low but stable   Goal of Therapy:  Heparin level: 0.3-0.5 units/mL APTT goal: 66-85 sec Monitor platelets by anticoagulation protocol: Yes   Plan:  -Cont heparin at 2700 units/hr -Heparin level q12 hours 5a/5p -Monitor CBC, and for s/sx of bleeding  Narda Bonds, PharmD, BCPS Clinical Pharmacist Phone: [phone]

## 2019-08-28 NOTE — Progress Notes (Signed)
  Echocardiogram Echocardiogram Transesophageal has been performed.  Cherryl Babin A Madelena Maturin 08/28/2019, 1:14 PM

## 2019-08-28 NOTE — Progress Notes (Signed)
Patient ID: [name], male   DOB: 1976/05/26, 44 y.o.   MRN: [id number]    Advanced Heart Failure Rounding Note   Subjective:    Circuit changed and trach exchanged on 1/8.   Remains on VV ecmo with good flows.   Oxygen saturations remain in the mid to high 80s. Dropped transiently over night. Remains intubated/sedated  Started on lasix gtt at 4/hr. Now putting out ~ 275/hr. No chugging  Bronch yesterday with RLL mucous clot but non bloody.   ABG 7.41/41/49/85%  Co-ox 76%  Lactate 1.1  CXR: Diffuse bilateral lung opacities c/w ARDS. No significant change. Personally reviewed  On vent 100% PEEP 20. PlatP 30, Bi-vent setting.TVs remain 60-8-cc Personally reviewed  hgb 8.8 LDH 349  ECMO parameters Flow 4.9 RPM 3750 dP 33 Pressure -115   Objective:   Weight Range:  Vital Signs:   Temp:  [97.1 F (36.2 C)-98 F (36.7 C)] 97.5 F (36.4 C) (01/20 0400) Pulse Rate:  [48-103] 80 (01/20 0715) Resp:  [0-21] 15 (01/20 0715) BP: (107-177)/(50-65) 107/56 (01/20 0342) SpO2:  [70 %-97 %] 83 % (01/20 0715) Arterial Line BP: (97-177)/(45-92) 119/51 (01/20 0715) FiO2 (%):  [100 %] 100 % (01/20 0458) Weight:  [156.4 kg] 156.4 kg (01/20 0500) Last BM Date: 08/27/19  Weight change: Filed Weights   08/26/19 0500 08/27/19 0500 08/28/19 0500  Weight: (!) 152.2 kg (!) 149.5 kg (!) 156.4 kg    Intake/Output:   Intake/Output Summary (Last 24 hours) at 08/28/2019 0734 Last data filed at 08/28/2019 0700 Gross per 24 hour  Intake 8372.13 ml  Output 5126 ml  Net 3246.13 ml     Physical Exam: General: Sedated on vent. In standing position HEENT: normal + nasal packing Neck: supple. + trach hard to see JVP Carotids 2+ bilat; no bruits. No lymphadenopathy or thryomegaly appreciated. Cor: PMI nondisplaced. Regular rate & rhythm. No rubs, gallops or murmurs. Lungs: minimal air movement Abdomen: obese soft, nontender, nondistended. No hepatosplenomegaly. No bruits or masses. Good  bowel sounds. Extremities: no cyanosis, clubbing, rash, 2+ edema femoral cannulas ok Neuro: intubated/sedated   Telemetry: Sinus 80s Personally reviewed  Labs: Basic Metabolic Panel: Recent Labs  Lab 08/24/19 0352 08/24/19 0401 08/25/19 0445 08/25/19 0452 08/26/19 0320 08/26/19 0458 08/27/19 0007 08/27/19 0456 08/27/19 1600 08/27/19 1700 08/27/19 2107 08/28/19 0406 08/28/19 0418  NA 144   < > 148*   < > 147*   < > 146*   < > 145 144 146* 142 146*  K 3.9   < > 4.1   < > 4.4   < > 4.1   < > 4.3 4.2 4.0 3.7 3.9  CL 109   < > 113*  --  114*  --  112*  --   --  111  --  108  --   CO2 24   < > 25  --  25  --  24  --   --  23  --  23  --   GLUCOSE 176*   < > 155*  --  158*  --  177*  --   --  213*  --  187*  --   BUN 38*   < > 39*  --  37*  --  38*  --   --  41*  --  39*  --   CREATININE 0.35*   < > 0.43*  --  0.48*  --  0.45*  --   --  0.60*  --  0.63  --   CALCIUM 9.0   < > 9.3   < > 9.4   < > 9.2  --   --  9.3  --  8.9  --   MG 1.8  --  2.0  --   --   --   --   --   --   --   --   --   --    < > = values in this interval not displayed.    Liver Function Tests: Recent Labs  Lab 08/22/19 0336 08/23/19 0335 08/24/19 0352 08/26/19 0320  AST 15 18 19  43*  ALT 20 23 27  65*  ALKPHOS 46 52 54 68  BILITOT 0.8 0.8 0.8 1.0  PROT 6.0* 6.1* 5.9* 6.3*  ALBUMIN 3.2* 3.0* 2.6* 3.4*   No results for input(s): LIPASE, AMYLASE in the last 168 hours. No results for input(s): AMMONIA in the last 168 hours.  CBC: Recent Labs  Lab 08/24/19 0352 08/24/19 0401 08/25/19 0445 08/25/19 0452 08/26/19 0306 08/26/19 0458 08/26/19 1646 08/26/19 1652 08/27/19 0007 08/27/19 0007 08/27/19 0448 08/27/19 0456 08/27/19 1600 08/27/19 1700 08/27/19 2107 08/28/19 0406 08/28/19 0418  WBC 4.6   < > 4.5   < > 5.2   < > 6.0  --  6.7  --  6.4  --   --  7.8  --  8.5  --   NEUTROABS 1.8  --  1.1*  --  2.1  --   --   --   --   --  2.4  --   --   --   --  3.6  --   HGB 8.3*   < > 8.0*   < > 8.1*    < > 7.9*   < > 8.5*   < > 8.4*   < > 9.9* 8.7* 8.8* 8.6* 8.8*  HCT 28.2*   < > 27.2*   < > 27.5*   < > 26.4*   < > 28.6*   < > 27.5*   < > 29.0* 29.4* 26.0* 28.7* 26.0*  MCV 97.6   < > 98.2   < > 97.5   < > 96.7  --  97.6  --  95.8  --   --  97.4  --  95.7  --   PLT 91*   < > 90*   < > 92*   < > 95*  --  97*  --  92*  --   --  96*  --  101*  --    < > = values in this interval not displayed.    Cardiac Enzymes: No results for input(s): CKTOTAL, CKMB, CKMBINDEX, TROPONINI in the last 168 hours.  BNP: BNP (last 3 results) Recent Labs    07/22/19 1039  BNP 15.3    ProBNP (last 3 results) No results for input(s): PROBNP in the last 8760 hours.    Other results:  Imaging: DG CHEST PORT 1 VIEW  Result Date: 08/27/2019 CLINICAL DATA:  Patient on ECMO. EXAM: PORTABLE CHEST 1 VIEW COMPARISON:  08/26/2019 FINDINGS: Tracheostomy tube is in a stable position. Feeding tube extends into the abdomen but the tip is beyond the image. Bilateral chest tubes are stable. Negative for a large pneumothorax. There continues to be low lung volumes with diffuse bilateral airspace densities. Cardiac silhouette is obscured by the lung densities. Right arm PICC line tip in the lower SVC region. Large vascular cannula in the IVC with the  tip near the inferior cavoatrial junction. IMPRESSION: Stable chest radiograph findings. Low lung volumes with diffuse bilateral airspace disease. No significant change in the support apparatuses. Electronically Signed   By: Markus Daft M.D.   On: 08/27/2019 08:04     Medications:     Scheduled Medications: . artificial tears  1 application Both Eyes M3W  . vitamin C  500 mg Per Tube Daily  . chlorhexidine gluconate (MEDLINE KIT)  15 mL Mouth Rinse BID  . Chlorhexidine Gluconate Cloth  6 each Topical Daily  . diazepam  20 mg Per Tube Q6H  . feeding supplement (PRO-STAT SUGAR FREE 64)  60 mL Per Tube BID  . free water  300 mL Per Tube Q8H  . gabapentin  450 mg Per Tube  Q8H  . HYDROmorphone  8 mg Per Tube Q4H  . insulin aspart  3-9 Units Subcutaneous Q4H  . ipratropium-albuterol  3 mL Nebulization QID  . mouth rinse  15 mL Mouth Rinse 10 times per day  . montelukast  10 mg Per Tube QHS  . oxymetazoline  1 spray Each Nare BID  . pantoprazole sodium  40 mg Per Tube QHS  . QUEtiapine  200 mg Per Tube BID  . sodium chloride flush  10-40 mL Intracatheter Q12H  . sodium chloride flush  10-40 mL Intracatheter Q12H  . Thrombi-Pad  1 each Topical Once  . zinc sulfate  220 mg Per Tube Daily    Infusions: . sodium chloride    . sodium chloride 10 mL/hr at 08/28/19 0700  . sodium chloride 10 mL/hr at 08/28/19 0700  . albumin human Stopped (08/28/19 0537)  . cefTRIAXone (ROCEPHIN)  IV Stopped (08/27/19 1117)  . cisatracurium (NIMBEX) infusion 6 mcg/kg/min (08/28/19 0700)  . dexmedetomidine (PRECEDEX) IV infusion 1.6 mcg/kg/hr (08/28/19 0700)  . dextrose    . feeding supplement (PIVOT 1.5 CAL) 1,000 mL (08/28/19 0032)  . furosemide (LASIX) infusion 4 mg/hr (08/28/19 0700)  . heparin 2,700 Units/hr (08/28/19 0700)  . HYDROmorphone 20 mg/hr (08/28/19 0700)  . midazolam (VERSED) infusion 37 mg/hr (08/28/19 0700)  . norepinephrine (LEVOPHED) Adult infusion Stopped (08/28/19 0622)  . propofol (DIPRIVAN) infusion Stopped (08/21/19 1030)    PRN Medications: Place/Maintain arterial line **AND** sodium chloride, sodium chloride, sodium chloride, acetaminophen (TYLENOL) oral liquid 160 mg/5 mL, acetaminophen, albumin human, dextrose, hydrALAZINE, HYDROmorphone, magnesium hydroxide, metoprolol tartrate, midazolam, ondansetron (ZOFRAN) IV, senna-docusate, sodium chloride, sodium chloride flush, sodium chloride flush, sodium chloride flush   Assessment/Plan:    1. Acute hypoxic/hypercarbic respiratory failure due to COVID PNA: Remained markedly hypoxic despite full support. Intubated 12/26. S/p bilateral CTs 12/26 for pneumothoraces.  TEE on 12/26 LVEF 70% RV ok. VV  ECMO begun 12/26. Tracheostomy 12/29.  COVID ARDS at this point.  Flows stable and sats improved with deeper sedation and re-paralysis. ECMO circuit changed 1/8 - ECMO parameters stable.  - sats remain in 80s% but end-organ perfusion looks good. Tolerating well.   - Main issue is bad lungs. Continue maneuvers to improve lung compliance - daily bronch with removal of clots, diuresis and standing position. Continue lasix gtt - Going to OR today for additional venous drainage cannula in RIJ later today - Continue to wait on lung recovery - Continues to have epistaxis with lung clots. Periodic bronchoscopy per CCM. Bronch today with mucus plug but no clots - LDH 349, stable   2. Bilateral PTX: Due to barotrauma.  - Driving pressure minimized at < 15 Stable. No change  3.  COVID PNA: management of COVID infection per CCM. CXR with bilateral multifocal PNA progressing to likely ARDS.  - s/p 10 days of remdesivir - 12/16, 12/2 s/p tociluzimab - 12/17 s/p convalescent plasma - Decadron at 7m/daily completed 1/11 - Completed cefepime/vanc - On ceftriaxone for nasal packing  4. ID: Empiric coverage with vancomycin/cefepime initially for possible secondary bacterial PNA, course completed.   - treated with cefepime and vanc for + cx with staph epidimidis, MSSA and serratia (sensitivities ok). - Cover with ceftriaxone for nasal packing - Remains stable - D/w PharmD  5. Thrombocytopenia: Slow decrease in platelets, likely low level hemolysis + critical illness.  - Stable 101k this am.  - On heaprin. No bleeding. Discussed dosing with PharmD.  6. Anemia: Bleeding at trach site resolved but still with epistaxis - hgb 9.2   today - Transfuse hgb < 8  7. FEN - continue TFs  D/w with ECMO team (ECMO coordinator/specialists, CCM, TCTS and pharmD) at bedside on multidisciplinary rounds. Continue to await ARDS recovery. Details as above.   Length of Stay: 313  CRITICAL CARE Performed by: DGlori Bickers Total critical care time: 35 minutes  Critical care time was exclusive of separately billable procedures and treating other patients.  Critical care was necessary to treat or prevent imminent or life-threatening deterioration.  Critical care was time spent personally by me on the following activities: development of treatment plan with patient and/or surrogate as well as nursing, discussions with consultants, evaluation of patient's response to treatment, examination of patient, obtaining history from patient or surrogate, ordering and performing treatments and interventions, ordering and review of laboratory studies, ordering and review of radiographic studies, pulse oximetry and re-evaluation of patient's condition.    DGlori BickersMD 08/28/2019, 7:34 AM  Advanced Heart Failure Team Pager [phone](M-F; 7a - 4p)  Please contact [email or web address] for night-coverage after hours (4p -7a ) and weekends on [email or web address]

## 2019-08-28 NOTE — Progress Notes (Signed)
25 Days Post-Op Procedure(s) (LRB): ECMO CANNULATION (N/A) Subjective: Patient with persistent problem with low O2 sat, even with inhaled NO Will place new cervical drainage cannula in OR today to help drainage and flow through the oxygenator riocedure discussed with patient wife Objective: Vital signs in last 24 hours: Temp:  [97.1 F (36.2 C)-98 F (36.7 C)] 97.5 F (36.4 C) (01/20 0400) Pulse Rate:  [48-103] 74 (01/20 0800) Cardiac Rhythm: Normal sinus rhythm (01/19 2000) Resp:  [0-21] 15 (01/20 0800) BP: (107-155)/(47-65) 112/47 (01/20 0800) SpO2:  [70 %-97 %] 85 % (01/20 0800) Arterial Line BP: (97-165)/(45-92) 119/51 (01/20 0715) FiO2 (%):  [100 %] 100 % (01/20 0800) Weight:  [156.4 kg] 156.4 kg (01/20 0500)  Hemodynamic parameters for last 24 hours:  nsr  Intake/Output from previous day: 01/19 0701 - 01/20 0700 In: 8372.1 [I.V.:4927.8; XB/DZ:3299; IV Piggyback:599.3] Out: 5126 [Urine:4701; Stool:75; Chest Tube:350] Intake/Output this shift: No intake/output data recorded.       Exam    General- sedated and paralyzed    Neck- no JVD, no cervical adenopathy palpable, no carotid bruit   Lungs- minimal breath sounds   Cor- regular rate and rhythm, no murmur , gallop   Abdomen- soft, non-tender, Groin cannulation sites w/o hematoma   Extremities - warm, non-tender, 2+l edema   Neuro- sedated on Bis monitor   Lab Results: Recent Labs    08/27/19 1700 08/27/19 2107 08/28/19 0406 08/28/19 0406 08/28/19 0418 08/28/19 0754  WBC 7.8  --  8.5  --   --   --   HGB 8.7*   < > 8.6*   < > 8.8* 8.8*  HCT 29.4*   < > 28.7*   < > 26.0* 26.0*  PLT 96*  --  101*  --   --   --    < > = values in this interval not displayed.   BMET:  Recent Labs    08/27/19 1700 08/27/19 2107 08/28/19 0406 08/28/19 0406 08/28/19 0418 08/28/19 0754  NA 144   < > 142   < > 146* 146*  K 4.2   < > 3.7   < > 3.9 3.8  CL 111  --  108  --   --   --   CO2 23  --  23  --   --   --    GLUCOSE 213*  --  187*  --   --   --   BUN 41*  --  39*  --   --   --   CREATININE 0.60*  --  0.63  --   --   --   CALCIUM 9.3  --  8.9  --   --   --    < > = values in this interval not displayed.    PT/INR:  Recent Labs    08/28/19 0406  LABPROT 17.0*  INR 1.4*   ABG    Component Value Date/Time   PHART 7.400 08/28/2019 0754   HCO3 25.0 08/28/2019 0754   TCO2 26 08/28/2019 0754   ACIDBASEDEF 2.0 08/27/2019 1244   O2SAT 83.0 08/28/2019 0754   CBG (last 3)  Recent Labs    08/28/19 0025 08/28/19 0419 08/28/19 0752  GLUCAP 161* 167* 163*    Assessment/Plan: S/P Procedure(s) (LRB): ECMO CANNULATION (N/A) Additional cannula for SVC drainage in OR today   LOS: 37 days    Tharon Aquas Trigt III 08/28/2019

## 2019-08-28 NOTE — Progress Notes (Signed)
NAME:  [name], MRN:  [id number], DOB:  1976-01-15, LOS: 15 ADMISSION DATE:  07/22/2019, CONSULTATION DATE:  12/24 REFERRING MD:  Sloan Leiter, CHIEF COMPLAINT:  Dyspnea   Brief History   44 y/o M admitted 12/14 with COVID pneumonia causing acute hypoxemic respiratory failure. Developed pneumomediastinum 12/23 with concern for possible bilateral small pneumothoraces on 12/24.  PCCM consulted for evaluation of pneumothoraces  Past Medical History  GERD HTN Asthma  Significant Hospital Events   12/14 admit with hypoxemic respiratory failure in setting of COVID-19 pneumonia 12/23 noted to have pneumomediastinum on chest x-ray 12/24 PCCM consulted for evaluation 12/26 worsening hypoxemia, intubated  12/27 VV fem/fem ECMO cannulation See bedside nursing event notes for full rundown of procedures after 12/27   Consults:  PCCM, CTS, CHF, Palliative  Procedures:  See bedside nursing event notes for full rundown of procedures.  Significant Diagnostic Tests:  CT chest 12/22 >> extensive pneumomediastinum, multifocal patchy bilateral groundglass opacities  Micro Data:  BCx2 12/14 >> negative  Sputum 12/15 >> negative  1/9 bronch: serratia and MSSA 1/2 acid fast smear bronch: negative 1/2 fungal cx bronch: pending 1/12 BAL: serratia 1/18 BAL >>  Antimicrobials:  Received remdesivir, steroids, actemra and convalescent plasma  5 days cefepime 12/24-12/29 Cefepime 1/4->1/14 vanc 1/4->1/14 Ceftriaxone 1/14 >>   Interim history/subjective:  Bronched yesterday for bloody secretions.  Desaturations with recumbency improved with reverse Trendelenburg.  Objective   Blood pressure (!) 112/47, pulse 71, temperature (!) 97.3 F (36.3 C), temperature source Axillary, resp. rate 15, height _0  (1.727 m), weight (!) 156.4 kg, SpO2 (!) 87 %. CVP:  [20 mmHg] 20 mmHg  Vent Mode: Bi-Vent FiO2 (%):  [100 %] 100 % Set Rate:  [15 bmp] 15 bmp PEEP:  [15 cmH20] 15 cmH20   Intake/Output  Summary (Last 24 hours) at 08/28/2019 1227 Last data filed at 08/28/2019 1200 Gross per 24 hour  Intake 8122.93 ml  Output 5890 ml  Net 2232.93 ml   Filed Weights   08/26/19 0500 08/27/19 0500 08/28/19 0500  Weight: (!) 152.2 kg (!) 149.5 kg (!) 156.4 kg   Examination GEN: obese ill man on vent HEENT: trach in place, small thick mucoid secretions, nasal packing in place bilaterally, now more clear serous drainage from nose CV: RRR, ext warm PULM: poor air movement, passive on vent.  Bronchial breath sounds anteriorly GI: Soft, + BS EXT: Global anascarca NEURO: sedated, incomplete paralysis, still coughs with suctioning PSYCH: RASS -4 SKIN: pale Femoral cannulas in place, sites look okay Bilateral chest tubes in place, no tidaling or air leak, ongoing serosanguinous output Foley in place dark yellow urine Rectal tube in place with tan loose stool  CXR shows all segmental bronchi open, diffuse airspace disease BMP looks good Sugars look good LDH stable in 300s ABG good gas exchange paO2 stable in low 50s Heparin 0.34 (goal 0.3-0.5) HgB 9.2 stable, plts 92, stable Lactate 1.1, stable  Flows around 5-5.2 Pven -110-120 Part 240 Delta 32 Ven sat 53  Vent Plat 32 cmH2O 1/19 Changed him to 30/15 Bivent 2/2s (RR 15), TV 100-110  I/O: +3.5L 24h, +25L admission, after discussion weight is up probably around 30-50 lbs for admission  CXR: again all segments are up as evidenced by air bronchograms, RLL a little worse  Sedation  Oral Valium 46m q6h Dilaudid 877mq4h Seroquel 2003mID Gabapentin 450m47mh  IV Precedex @ 1.6mcg56m/hr (0-1.6) Versed @ 30 mg/hr (1-40) Dilaudid @ 20mg/42m0.5-20)  Resolved Hospital Problem  list    Assessment & Plan:    Acute hypoxemic respiratory failure with ARDS secondary to COVID-19, s/p tracheostomy requiring mechanical ventilation and VV ECMO support.  COVID has been treated with all evidence based therapies, we are dealing with  lingering severe diffuse alveolar damage.  -Continue ECMO at current flow rate.  -Bronchoscopy for secretion/clot clearance as needed -Continue to follow Vt on Bivent. Keep Driving pressure 15 of less. -Intermittent diuresis, discussed with CHF team daily -Continues to desaturate and chug with minimal stimulation.  Continue current level enteral sedation. -Sedation oral and IV as written above -Premedicate with Dilaudid prior to turns. -Heparin with 0.3-0.5 goal  -We will place additional drainage cannula today -Increase diuretic rate to achieve net negative fluid status 1 second cannula in place.   MSSA and serratia marcescens pneumonia:  cefepime and vancomycin completed for 10 days Still has serratia on BAL but no signs of infection Ceftriaxone with duration TBD for TSS ppx while nasal packing in place  Bilateral pneumothoraces and pneumomediastinum No leak today Continue suction  -20cmH2O  Posterior epistaxis.  Repacked multiple times  Next due for repacking 1/20 (would challenge with them out each time), will do q5 days after discussion with ENT   Daily Goals Checklist  Pain/Anxiety/Delirium protocol (if indicated): see discussion above VAP protocol (if indicated): Bundle in place. Respiratory support goals: Continue Bivent with ultra-lung protective ventilation, generous PEEP Blood pressure target:  MAP>65, off pressors DVT prophylaxis: on systemic anticoagulation on VV-ECMO -  Nutrition Status: Nutrition Problem: Increased nutrient needs Etiology: acute illness(COVID PNA) Signs/Symptoms: estimated needs Interventions: Tube feeding, Prostat GI prophylaxis: Protonix per tube. Fluid status goals: even if allowed by pven Urinary catheter: Guide hemodynamic management Glucose control: euglycemic on SSI. Mobility/therapy needs: Bedrest.  Daily labs: as per ECMO protocol. Code Status: Full code  Family Communication: reached out 1/17, in clinic today so hopefully RN can  do Disposition: ICU   Labs   CBC: Recent Labs  Lab 08/24/19 0352 08/24/19 0401 08/25/19 0445 08/25/19 0452 08/26/19 0306 08/26/19 0458 08/26/19 1646 08/26/19 1652 08/27/19 0007 08/27/19 0007 08/27/19 0448 08/27/19 0456 08/27/19 1700 08/27/19 2107 08/28/19 0406 08/28/19 0418 08/28/19 0754  WBC 4.6   < > 4.5   < > 5.2   < > 6.0  --  6.7  --  6.4  --  7.8  --  8.5  --   --   NEUTROABS 1.8  --  1.1*  --  2.1  --   --   --   --   --  2.4  --   --   --  3.6  --   --   HGB 8.3*   < > 8.0*   < > 8.1*   < > 7.9*   < > 8.5*   < > 8.4*   < > 8.7* 8.8* 8.6* 8.8* 8.8*  HCT 28.2*   < > 27.2*   < > 27.5*   < > 26.4*   < > 28.6*   < > 27.5*   < > 29.4* 26.0* 28.7* 26.0* 26.0*  MCV 97.6   < > 98.2   < > 97.5   < > 96.7  --  97.6  --  95.8  --  97.4  --  95.7  --   --   PLT 91*   < > 90*   < > 92*   < > 95*  --  97*  --  92*  --  96*  --  101*  --   --    < > = values in this interval not displayed.    Basic Metabolic Panel: Recent Labs  Lab 08/24/19 0352 08/24/19 0401 08/25/19 0445 08/25/19 0452 08/26/19 0320 08/26/19 0458 08/27/19 0007 08/27/19 0456 08/27/19 1700 08/27/19 2107 08/28/19 0406 08/28/19 0418 08/28/19 0754  NA 144   < > 148*   < > 147*   < > 146*   < > 144 146* 142 146* 146*  K 3.9   < > 4.1   < > 4.4   < > 4.1   < > 4.2 4.0 3.7 3.9 3.8  CL 109   < > 113*  --  114*  --  112*  --  111  --  108  --   --   CO2 24   < > 25  --  25  --  24  --  23  --  23  --   --   GLUCOSE 176*   < > 155*  --  158*  --  177*  --  213*  --  187*  --   --   BUN 38*   < > 39*  --  37*  --  38*  --  41*  --  39*  --   --   CREATININE 0.35*   < > 0.43*  --  0.48*  --  0.45*  --  0.60*  --  0.63  --   --   CALCIUM 9.0   < > 9.3  --  9.4  --  9.2  --  9.3  --  8.9  --   --   MG 1.8  --  2.0  --   --   --   --   --   --   --   --   --   --    < > = values in this interval not displayed.   GFR: Estimated Creatinine Clearance: 170.9 mL/min (by C-G formula based on SCr of 0.63  mg/dL). Recent Labs  Lab 08/25/19 0445 08/25/19 2328 08/26/19 0306 08/26/19 1646 08/27/19 0007 08/27/19 0448 08/27/19 1700 08/28/19 0406  WBC 4.5   < > 5.2   < > 6.7 6.4 7.8 8.5  LATICACIDVEN 1.1  --  1.1  --   --  1.1  --  1.2   < > = values in this interval not displayed.    Liver Function Tests: Recent Labs  Lab 08/22/19 0336 08/23/19 0335 08/24/19 0352 08/26/19 0320  AST _0 43*  ALT _1 65*  ALKPHOS 46 52 54 68  BILITOT 0.8 0.8 0.8 1.0  PROT 6.0* 6.1* 5.9* 6.3*  ALBUMIN 3.2* 3.0* 2.6* 3.4*   No results for input(s): LIPASE, AMYLASE in the last 168 hours. No results for input(s): AMMONIA in the last 168 hours.  ABG    Component Value Date/Time   PHART 7.400 08/28/2019 0754   PCO2ART 40.1 08/28/2019 0754   PO2ART 46.0 (L) 08/28/2019 0754   HCO3 25.0 08/28/2019 0754   TCO2 26 08/28/2019 0754   ACIDBASEDEF 2.0 08/27/2019 1244   O2SAT 83.0 08/28/2019 0754     Coagulation Profile: Recent Labs  Lab 08/22/19 0336 08/23/19 0335 08/24/19 0352 08/28/19 0406  INR 1.2 1.3* 1.3* 1.4*    Cardiac Enzymes: No results for input(s): CKTOTAL, CKMB, CKMBINDEX, TROPONINI in the last 168 hours.  HbA1C: Hgb A1c MFr Bld  Date/Time Value Ref Range Status  07/24/2019 04:50 AM 6.7 (H) 4.8 - 5.6 % Final    Comment:    (NOTE) Pre diabetes:          5.7%-6.4% Diabetes:              >6.4% Glycemic control for   <7.0% adults with diabetes   09/30/2017 05:36 AM 5.7 (H) 4.8 - 5.6 % Final    Comment:    (NOTE)         Prediabetes: 5.7 - 6.4         Diabetes: >6.4         Glycemic control for adults with diabetes: <7.0     CBG: Recent Labs  Lab 08/27/19 2005 08/28/19 0025 08/28/19 0419 08/28/19 0752 08/28/19 1107  GLUCAP 171* 161* 167* 163* 163*    CRITICAL CARE Performed by: Kipp Brood   Total critical care time: 50 minutes  Critical care time was exclusive of separately billable procedures and treating other patients.  Critical care was  necessary to treat or prevent imminent or life-threatening deterioration.  Critical care was time spent personally by me on the following activities: development of treatment plan with patient and/or surrogate as well as nursing, discussions with consultants, evaluation of patient's response to treatment, examination of patient, obtaining history from patient or surrogate, ordering and performing treatments and interventions, ordering and review of laboratory studies, ordering and review of radiographic studies, pulse oximetry, re-evaluation of patient's condition and participation in multidisciplinary rounds.  Kipp Brood, MD [facility] ICU Physician [place]  Pager: [phone] Mobile: [phone] After hours: [phone].     08/28/2019, 12:27 PM

## 2019-08-28 NOTE — Progress Notes (Signed)
ANTICOAGULATION CONSULT NOTE  Pharmacy Consult for Heparin Indication: ECMO  No Known Allergies  Patient Measurements: Height: 5' 8"  (172.7 cm) Weight: (!) 344 lb 12.8 oz (156.4 kg) IBW/kg (Calculated) : 68.4 Heparin Dosing Weight: 100.7 kg  Vital Signs: Temp: 99.4 F (37.4 C) (01/20 1954) Temp Source: Oral (01/20 1954) Pulse Rate: 88 (01/20 1932)  Labs: Recent Labs    08/27/19 1700 08/27/19 2107 08/28/19 0406 08/28/19 0418 08/28/19 1519 08/28/19 1519 08/28/19 1649 08/28/19 1651  HGB 8.7*   < > 8.6*   < > 8.5*   < > 8.8* 9.5*  HCT 29.4*   < > 28.7*   < > 25.0*  --  29.2* 28.0*  PLT 96*  --  101*  --   --   --  95*  --   APTT 97*  --  102*  --   --   --  59*  --   LABPROT  --   --  17.0*  --   --   --   --   --   INR  --   --  1.4*  --   --   --   --   --   HEPARINUNFRC 0.29*  --  0.30  --   --   --  0.13*  --   CREATININE 0.60*  --  0.63  --   --   --  0.62  --    < > = values in this interval not displayed.    Estimated Creatinine Clearance: 170.9 mL/min (by C-G formula based on SCr of 0.62 mg/dL).  Assessment: 44 yo male known COVID+ and ARDS VV ECMO. Heparin was restarted 1/5 (bivalirudin stopped as HIT test was negative).  Heparin level this morning is therapeutic at 0.3. CBC stable. Pt is s/p  additional SVC drain placed in OR today. Pt also due to nasal repacking today. Heparin drip decreased 1800 uts/hr on way into OR and increased to 2000 uts/hr on way out of OR with HL 0.13 post cannulation change.   No bleeding, h/h stable, PLTC stable in 90s    Goal of Therapy:  Heparin level: 0.3-0.5 units/mL APTT goal: 66-85 sec Monitor platelets by anticoagulation protocol: Yes   Plan:  Increase heparin drip back to 2600 uts/hr  -Heparin level q12 hours 5a/5p -Monitor CBC, and for s/sx of bleeding   Bonnita Nasuti Pharm.D. CPP, BCPS Clinical Pharmacist [phone] 08/28/2019 8:43 PM    Please check AMION for all [place] phone numbers After 10:00 PM,  call [place] [phone]  08/28/2019  8:30 PM

## 2019-08-29 ENCOUNTER — Inpatient Hospital Stay (HOSPITAL_COMMUNITY): Payer: Managed Care, Other (non HMO)

## 2019-08-29 ENCOUNTER — Encounter: Payer: Self-pay | Admitting: *Deleted

## 2019-08-29 LAB — POCT I-STAT 7, (LYTES, BLD GAS, ICA,H+H)
Acid-Base Excess: 2 mmol/L (ref 0.0–2.0)
Acid-Base Excess: 1 mmol/L (ref 0.0–2.0)
Acid-Base Excess: 3 mmol/L — ABNORMAL HIGH (ref 0.0–2.0)
Acid-Base Excess: 5 mmol/L — ABNORMAL HIGH (ref 0.0–2.0)
Bicarbonate: 22.9 mmol/L (ref 20.0–28.0)
Bicarbonate: 25.2 mmol/L (ref 20.0–28.0)
Bicarbonate: 25.6 mmol/L (ref 20.0–28.0)
Bicarbonate: 26.8 mmol/L (ref 20.0–28.0)
Bicarbonate: 28.6 mmol/L — ABNORMAL HIGH (ref 20.0–28.0)
Calcium, Ion: 1.21 mmol/L (ref 1.15–1.40)
Calcium, Ion: 1.22 mmol/L (ref 1.15–1.40)
Calcium, Ion: 1.25 mmol/L (ref 1.15–1.40)
Calcium, Ion: 1.26 mmol/L (ref 1.15–1.40)
Calcium, Ion: 1.26 mmol/L (ref 1.15–1.40)
HCT: 26 % — ABNORMAL LOW (ref 39.0–52.0)
HCT: 28 % — ABNORMAL LOW (ref 39.0–52.0)
HCT: 29 % — ABNORMAL LOW (ref 39.0–52.0)
HCT: 30 % — ABNORMAL LOW (ref 39.0–52.0)
HCT: 36 % — ABNORMAL LOW (ref 39.0–52.0)
Hemoglobin: 10.2 g/dL — ABNORMAL LOW (ref 13.0–17.0)
Hemoglobin: 12.2 g/dL — ABNORMAL LOW (ref 13.0–17.0)
Hemoglobin: 8.8 g/dL — ABNORMAL LOW (ref 13.0–17.0)
Hemoglobin: 9.5 g/dL — ABNORMAL LOW (ref 13.0–17.0)
Hemoglobin: 9.9 g/dL — ABNORMAL LOW (ref 13.0–17.0)
O2 Saturation: 87 %
O2 Saturation: 88 %
O2 Saturation: 88 %
O2 Saturation: 89 %
O2 Saturation: 89 %
Patient temperature: 36.3
Patient temperature: 97.5
Patient temperature: 97.7
Patient temperature: 98.4
Patient temperature: 98.5
Potassium: 2.8 mmol/L — ABNORMAL LOW (ref 3.5–5.1)
Potassium: 3 mmol/L — ABNORMAL LOW (ref 3.5–5.1)
Potassium: 3.1 mmol/L — ABNORMAL LOW (ref 3.5–5.1)
Potassium: 3.1 mmol/L — ABNORMAL LOW (ref 3.5–5.1)
Potassium: 3.2 mmol/L — ABNORMAL LOW (ref 3.5–5.1)
Sodium: 146 mmol/L — ABNORMAL HIGH (ref 135–145)
Sodium: 147 mmol/L — ABNORMAL HIGH (ref 135–145)
Sodium: 147 mmol/L — ABNORMAL HIGH (ref 135–145)
Sodium: 147 mmol/L — ABNORMAL HIGH (ref 135–145)
Sodium: 148 mmol/L — ABNORMAL HIGH (ref 135–145)
TCO2: 24 mmol/L (ref 22–32)
TCO2: 26 mmol/L (ref 22–32)
TCO2: 27 mmol/L (ref 22–32)
TCO2: 28 mmol/L (ref 22–32)
TCO2: 30 mmol/L (ref 22–32)
pCO2 arterial: 31.2 mmHg — ABNORMAL LOW (ref 32.0–48.0)
pCO2 arterial: 34 mmHg (ref 32.0–48.0)
pCO2 arterial: 35.3 mmHg (ref 32.0–48.0)
pCO2 arterial: 35.4 mmHg (ref 32.0–48.0)
pCO2 arterial: 36.4 mmHg (ref 32.0–48.0)
pH, Arterial: 7.46 — ABNORMAL HIGH (ref 7.350–7.450)
pH, Arterial: 7.473 — ABNORMAL HIGH (ref 7.350–7.450)
pH, Arterial: 7.484 — ABNORMAL HIGH (ref 7.350–7.450)
pH, Arterial: 7.484 — ABNORMAL HIGH (ref 7.350–7.450)
pH, Arterial: 7.5 — ABNORMAL HIGH (ref 7.350–7.450)
pO2, Arterial: 48 mmHg — ABNORMAL LOW (ref 83.0–108.0)
pO2, Arterial: 48 mmHg — ABNORMAL LOW (ref 83.0–108.0)
pO2, Arterial: 49 mmHg — ABNORMAL LOW (ref 83.0–108.0)
pO2, Arterial: 50 mmHg — ABNORMAL LOW (ref 83.0–108.0)
pO2, Arterial: 50 mmHg — ABNORMAL LOW (ref 83.0–108.0)

## 2019-08-29 LAB — BASIC METABOLIC PANEL
Anion gap: 15 (ref 5–15)
Anion gap: 14 (ref 5–15)
BUN: 34 mg/dL — ABNORMAL HIGH (ref 6–20)
BUN: 30 mg/dL — ABNORMAL HIGH (ref 6–20)
CO2: 23 mmol/L (ref 22–32)
CO2: 26 mmol/L (ref 22–32)
Calcium: 9.1 mg/dL (ref 8.9–10.3)
Calcium: 9 mg/dL (ref 8.9–10.3)
Chloride: 107 mmol/L (ref 98–111)
Chloride: 103 mmol/L (ref 98–111)
Creatinine, Ser: 0.6 mg/dL — ABNORMAL LOW (ref 0.61–1.24)
Creatinine, Ser: 0.64 mg/dL (ref 0.61–1.24)
GFR calc Af Amer: >60 mL/min (ref >60)
GFR calc Af Amer: >60 mL/min (ref >60)
GFR calc non Af Amer: >60 mL/min (ref >60)
GFR calc non Af Amer: >60 mL/min (ref >60)
Glucose, Bld: 128 mg/dL — ABNORMAL HIGH (ref 70–99)
Glucose, Bld: 142 mg/dL — ABNORMAL HIGH (ref 70–99)
Potassium: 3.1 mmol/L — ABNORMAL LOW (ref 3.5–5.1)
Potassium: 3.3 mmol/L — ABNORMAL LOW (ref 3.5–5.1)
Sodium: 145 mmol/L (ref 135–145)
Sodium: 143 mmol/L (ref 135–145)

## 2019-08-29 LAB — COOXEMETRY PANEL
Carboxyhemoglobin: 4.2 % — ABNORMAL HIGH (ref 0.5–1.5)
Methemoglobin: 0.9 % (ref 0.0–1.5)
O2 Saturation: 70.9 %
Total hemoglobin: 7.9 g/dL — ABNORMAL LOW (ref 12.0–16.0)

## 2019-08-29 LAB — LACTIC ACID, PLASMA: Lactic Acid, Venous: 0.9 mmol/L (ref 0.5–1.9)

## 2019-08-29 LAB — CBC WITH DIFFERENTIAL/PLATELET
Abs Immature Granulocytes: 3.62 10*3/uL — ABNORMAL HIGH (ref 0.00–0.07)
Basophils Absolute: 0.2 10*3/uL — ABNORMAL HIGH (ref 0.0–0.1)
Basophils Relative: 1 %
Eosinophils Absolute: 0.4 10*3/uL (ref 0.0–0.5)
Eosinophils Relative: 3 %
HCT: 31 % — ABNORMAL LOW (ref 39.0–52.0)
Hemoglobin: 9.3 g/dL — ABNORMAL LOW (ref 13.0–17.0)
Immature Granulocytes: 29 %
Lymphocytes Relative: 14 %
Lymphs Abs: 1.7 10*3/uL (ref 0.7–4.0)
MCH: 28.1 pg (ref 26.0–34.0)
MCHC: 30 g/dL (ref 30.0–36.0)
MCV: 93.7 fL (ref 80.0–100.0)
Monocytes Absolute: 1.1 10*3/uL — ABNORMAL HIGH (ref 0.1–1.0)
Monocytes Relative: 9 %
Neutro Abs: 5.5 10*3/uL (ref 1.7–7.7)
Neutrophils Relative %: 44 %
Platelets: 108 10*3/uL — ABNORMAL LOW (ref 150–400)
RBC: 3.31 MIL/uL — ABNORMAL LOW (ref 4.22–5.81)
RDW: 18 % — ABNORMAL HIGH (ref 11.5–15.5)
WBC: 12.5 10*3/uL — ABNORMAL HIGH (ref 4.0–10.5)
nRBC: 1.9 % — ABNORMAL HIGH (ref 0.0–0.2)

## 2019-08-29 LAB — CBC
HCT: 29.5 % — ABNORMAL LOW (ref 39.0–52.0)
Hemoglobin: 9.2 g/dL — ABNORMAL LOW (ref 13.0–17.0)
MCH: 28.5 pg (ref 26.0–34.0)
MCHC: 31.2 g/dL (ref 30.0–36.0)
MCV: 91.3 fL (ref 80.0–100.0)
Platelets: 108 10*3/uL — ABNORMAL LOW (ref 150–400)
RBC: 3.23 MIL/uL — ABNORMAL LOW (ref 4.22–5.81)
RDW: 17.8 % — ABNORMAL HIGH (ref 11.5–15.5)
WBC: 11.2 10*3/uL — ABNORMAL HIGH (ref 4.0–10.5)
nRBC: 2 % — ABNORMAL HIGH (ref 0.0–0.2)

## 2019-08-29 LAB — GLUCOSE, CAPILLARY
Glucose-Capillary: 117 mg/dL — ABNORMAL HIGH (ref 70–99)
Glucose-Capillary: 97 mg/dL (ref 70–99)
Glucose-Capillary: 120 mg/dL — ABNORMAL HIGH (ref 70–99)
Glucose-Capillary: 137 mg/dL — ABNORMAL HIGH (ref 70–99)
Glucose-Capillary: 114 mg/dL — ABNORMAL HIGH (ref 70–99)

## 2019-08-29 LAB — HEPARIN LEVEL (UNFRACTIONATED)
Heparin Unfractionated: 0.24 IU/mL — ABNORMAL LOW (ref 0.30–0.70)
Heparin Unfractionated: 0.36 IU/mL (ref 0.30–0.70)
Heparin Unfractionated: 0.31 IU/mL (ref 0.30–0.70)

## 2019-08-29 LAB — LACTATE DEHYDROGENASE: LDH: 465 U/L — ABNORMAL HIGH (ref 98–192)

## 2019-08-29 LAB — APTT
aPTT: 89 seconds — ABNORMAL HIGH (ref 24–36)
aPTT: 80 seconds — ABNORMAL HIGH (ref 24–36)

## 2019-08-29 IMAGING — US US ABDOMEN LIMITED
1 series · 14 of 25 positions shown · non-contrast
Comparison: None.

EXAM:
ULTRASOUND ABDOMEN LIMITED RIGHT UPPER QUADRANT

[Series 1: us abdomen limited · 0.27mm/px · 14 of 26 slices shown]
[im 1/26]
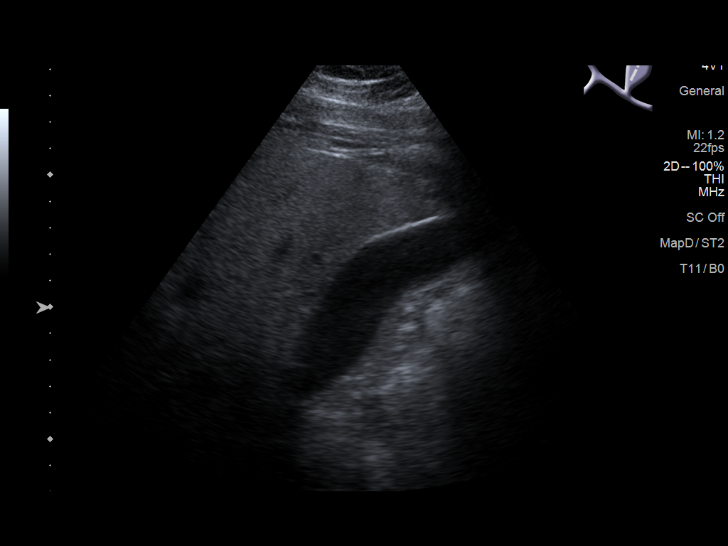
[im 3/26]
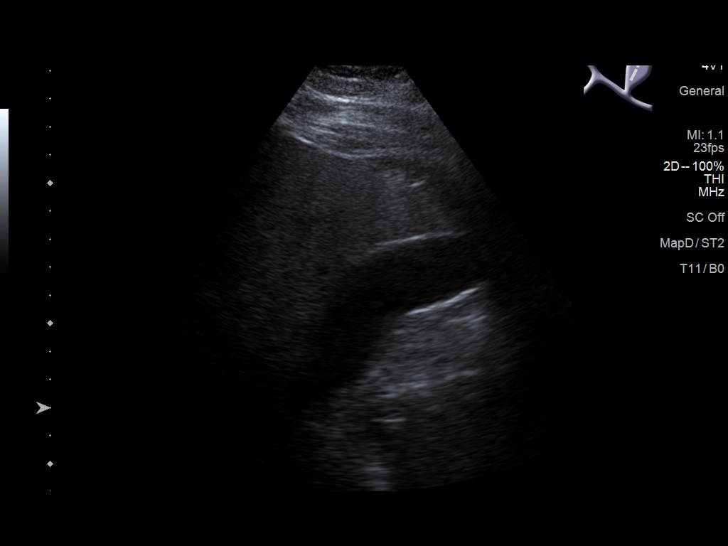
[im 5/26]
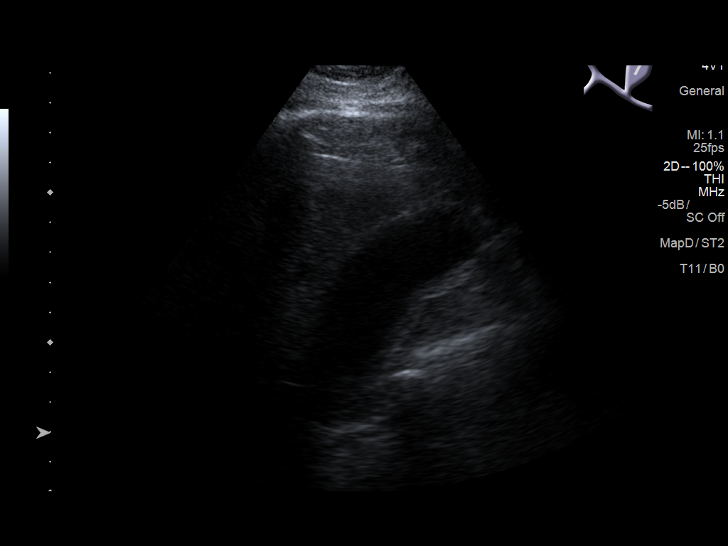
[im 7/26]
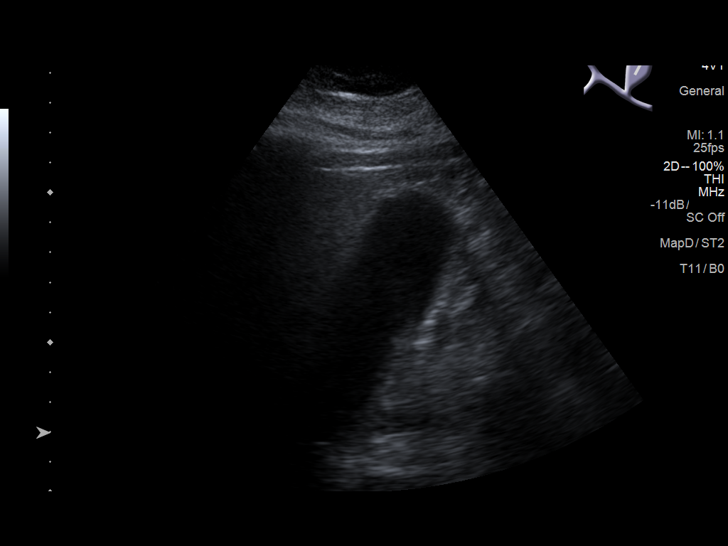
[im 9/26]
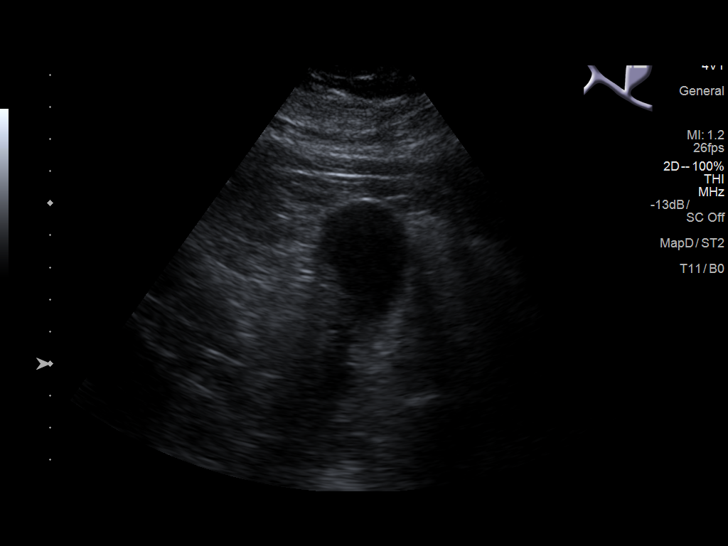
[im 10/26]
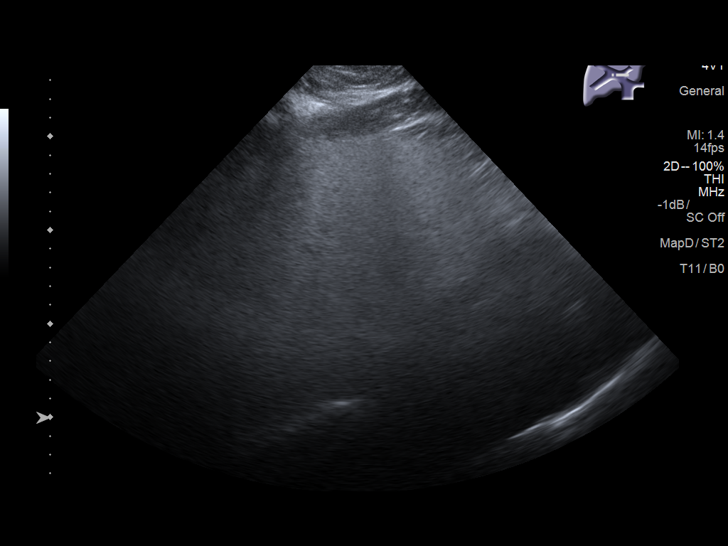
[im 12/26]
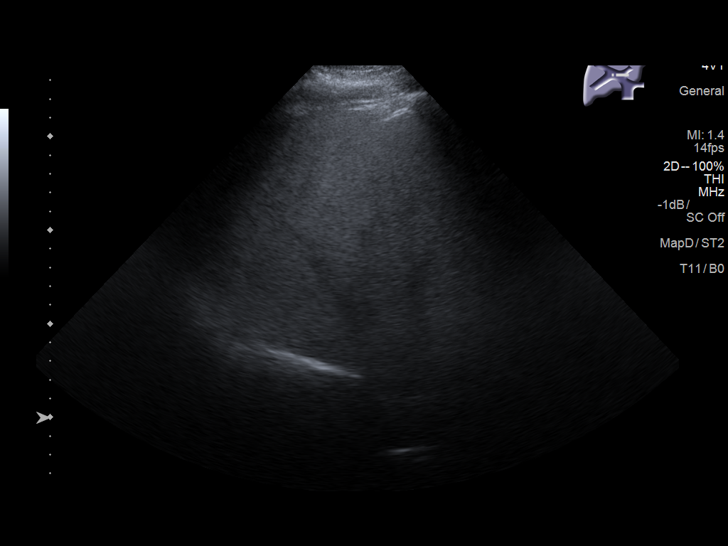
[im 14/26]
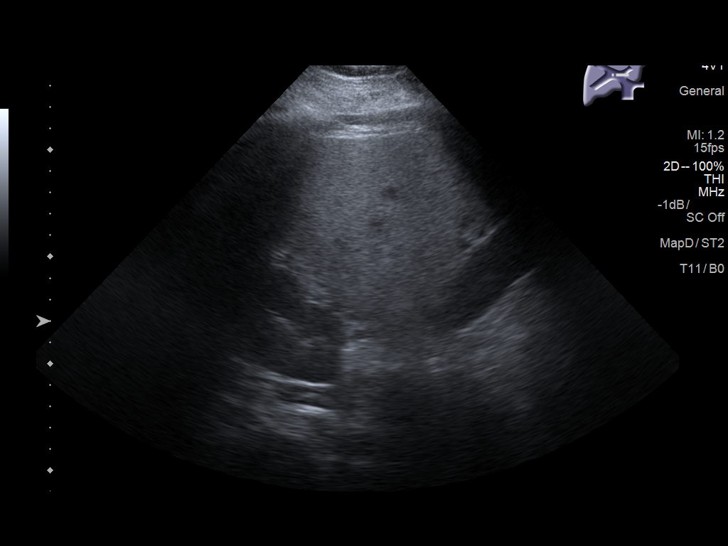
[im 16/26]
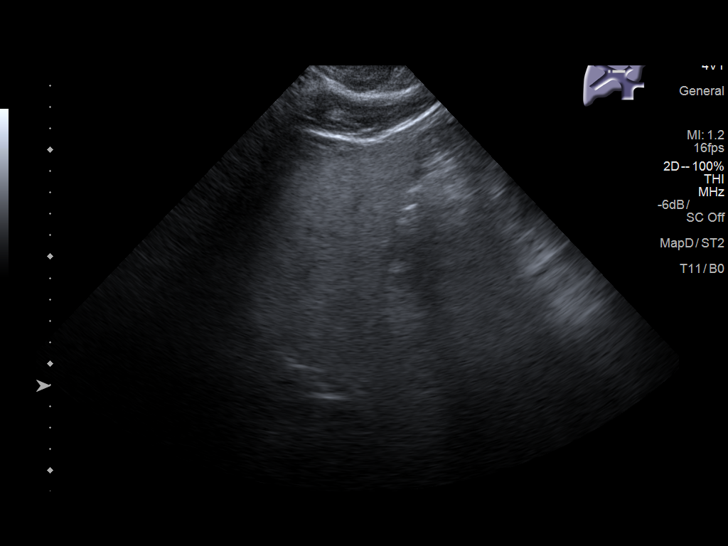
[im 17/26]
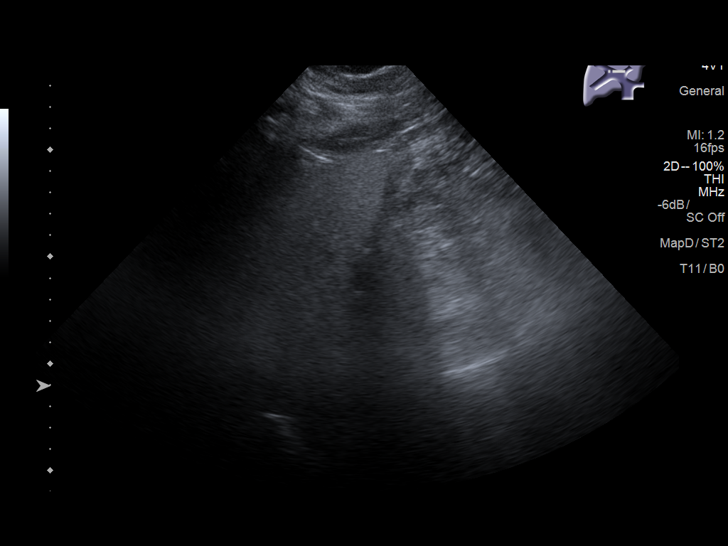
[im 19/26]
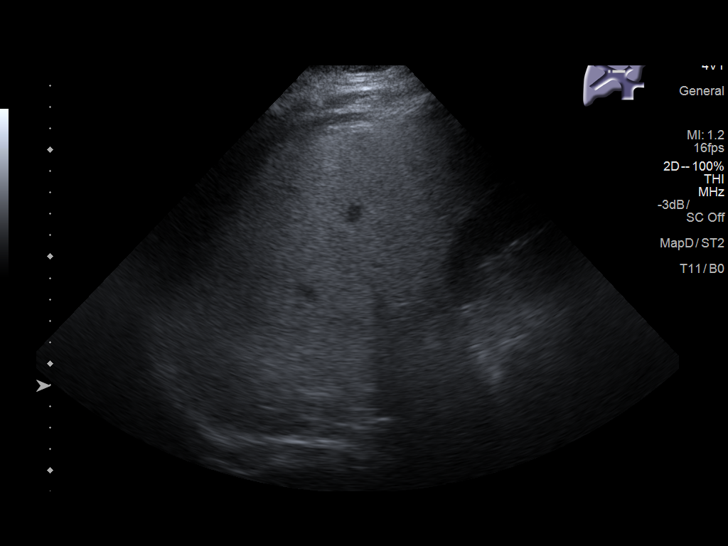
[im 21/26]
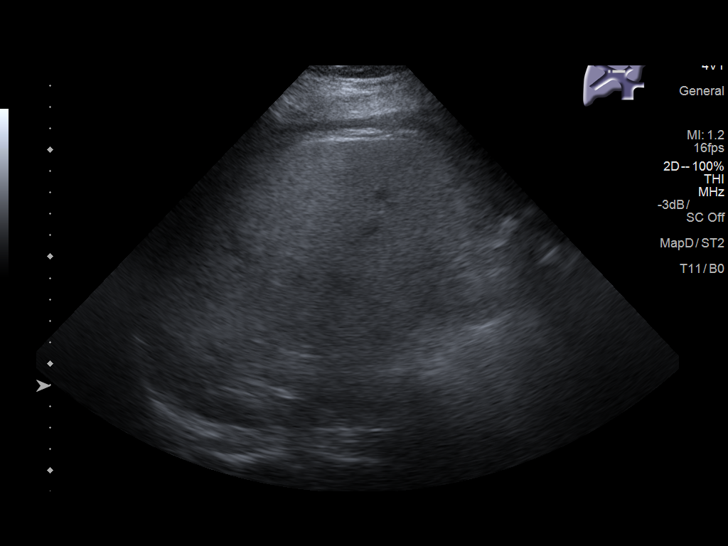
[im 23/26]
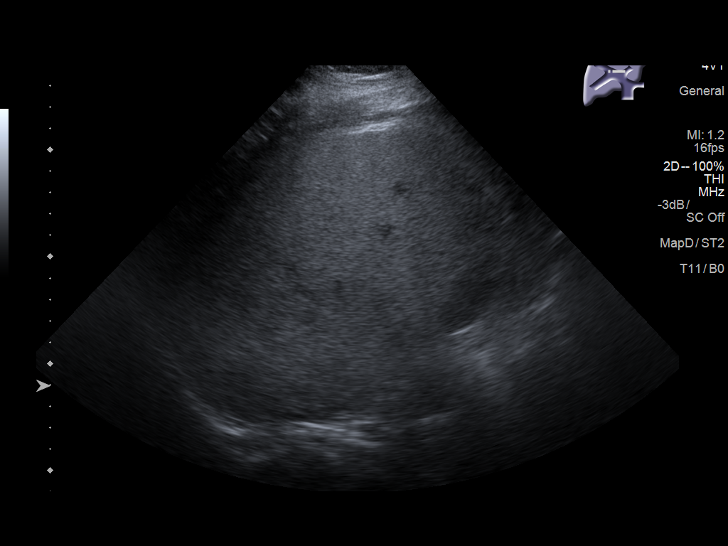
[im 26/26]
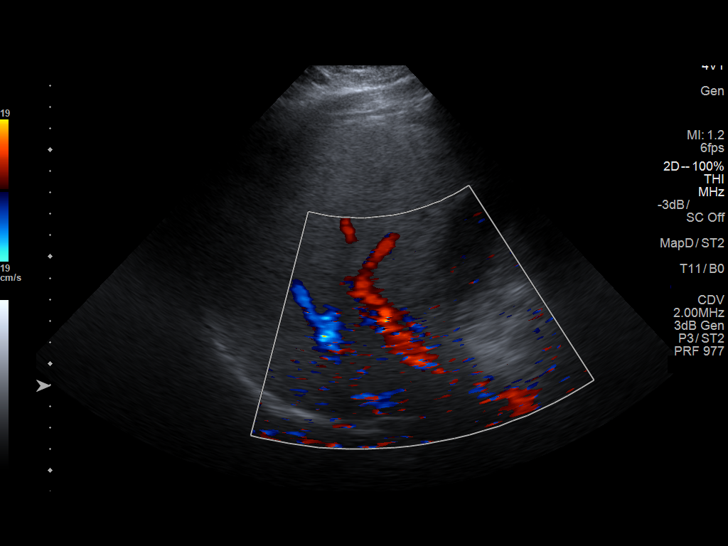

[14 of 25 positions shown; findings below may reference images not displayed]

FINDINGS: Gallbladder:

No gallstones or wall thickening visualized. No pericholecystic
fluid is identified. Layering biliary sludge is noted. No
sonographic Murphy sign noted by sonographer.

Common bile duct:

Diameter: 3.9 mm and within normal limits.

Liver:

No focal lesion identified. The liver is increased in echogenicity
consistent with hepatic steatosis. Portal vein is patent on color
Doppler imaging with normal direction of blood flow towards the
liver.
IMPRESSION: 1. Fatty appearing liver.
2. Biliary sludge without secondary signs of acute cholecystitis.

## 2019-08-29 MED ORDER — POTASSIUM CHLORIDE 10 MEQ/50ML IV SOLN
10.0000 meq | INTRAVENOUS | Status: AC
  Administered 2019-08-29 (×6): 10 meq via INTRAVENOUS
  Filled 2019-08-29: qty 50

## 2019-08-29 MED ORDER — CHLORHEXIDINE GLUCONATE 0.12 % MT SOLN
OROMUCOSAL | Status: AC
  Administered 2019-08-29: 20:00:00 15 mL
  Filled 2019-08-29: qty 15

## 2019-08-29 MED ORDER — POTASSIUM CHLORIDE 10 MEQ/50ML IV SOLN
10.0000 meq | INTRAVENOUS | Status: AC
  Administered 2019-08-29 (×6): 10 meq via INTRAVENOUS
  Filled 2019-08-29 (×6): qty 50

## 2019-08-29 NOTE — Progress Notes (Addendum)
ANTICOAGULATION CONSULT NOTE - Follow Up Consult  Pharmacy Consult for heparin Indication: ECMO  Labs: Recent Labs    08/27/19 1700 08/27/19 2107 08/28/19 0406 08/28/19 0418 08/28/19 1649 08/28/19 1649 08/28/19 1651 08/28/19 1651 08/28/19 2355 08/29/19 0355 08/29/19 0403 08/29/19 0407  HGB 8.7*   < > 8.6*   < > 8.8*   < > 9.5*   < > 8.8*  --  9.5*  --   HCT 29.4*   < > 28.7*   < > 29.2*   < > 28.0*  --  26.0*  --  28.0*  --   PLT 96*  --  101*  --  95*  --   --   --   --   --   --   --   APTT 97*   < > 102*  --  59*  --   --   --   --  89*  --   --   LABPROT  --   --  17.0*  --   --   --   --   --   --   --   --   --   INR  --   --  1.4*  --   --   --   --   --   --   --   --   --   HEPARINUNFRC 0.29*   < > 0.30  --  0.13*  --   --   --   --  0.24*  --   --   CREATININE 0.60*   < > 0.63  --  0.62  --   --   --   --   --   --  0.60*   < > = values in this interval not displayed.    Assessment: 44yo male remains subtherapeutic on heparin after rate change though level increasing; no gtt issues or signs of bleeding per RN.  Goal of Therapy:  Heparin level 0.3-0.5 units/ml   Plan:  Will increase heparin gtt to previously therapeutic rate of 2700 units/hr and check level with next scheduled labs.    Wynona Neat, PharmD, BCPS  08/29/2019,5:54 AM   Addendum: 6-hr heparin level is therapeutic at 0.36. CBC stable. Continue heparin infusion at 2700 units/hr. F/u 5AM/5PM coags.  Berenice Bouton, PharmD PGY1 Pharmacy Resident  Please check AMION for all [place] phone numbers After 10:00 PM, call [place] [phone]  08/29/2019  1:47 PM

## 2019-08-29 NOTE — Progress Notes (Signed)
NAME:  [name], MRN:  [id number], DOB:  [birth year], LOS: 22 ADMISSION DATE:  07/22/2019, CONSULTATION DATE:  12/24 REFERRING MD:  Sloan Leiter, CHIEF COMPLAINT:  Dyspnea   Brief History   44 y/o M admitted 12/14 with COVID pneumonia causing acute hypoxemic respiratory failure. Developed pneumomediastinum 12/23 with concern for possible bilateral small pneumothoraces on 12/24.  PCCM consulted for evaluation of pneumothoraces  Past Medical History  GERD HTN Asthma  Significant Hospital Events   12/14 admit with hypoxemic respiratory failure in setting of COVID-19 pneumonia 12/23 noted to have pneumomediastinum on chest x-ray 12/24 PCCM consulted for evaluation 12/26 worsening hypoxemia, intubated  12/27 VV fem/fem ECMO cannulation See bedside nursing event notes for full rundown of procedures after 12/27   Consults:  PCCM, CTS, CHF, Palliative  Procedures:  See bedside nursing event notes for full rundown of procedures.  Significant Diagnostic Tests:  CT chest 12/22 >> extensive pneumomediastinum, multifocal patchy bilateral groundglass opacities  Micro Data:  BCx2 12/14 >> negative  Sputum 12/15 >> negative  1/9 bronch: serratia and MSSA 1/2 acid fast smear bronch: negative 1/2 fungal cx bronch: pending 1/12 BAL: serratia 1/18 BAL >>  Antimicrobials:  Received remdesivir, steroids, actemra and convalescent plasma  5 days cefepime 12/24-12/29 Cefepime 1/4->1/14 vanc 1/4->1/14 Ceftriaxone 1/14 >>   Interim history/subjective:  Bronched yesterday for bloody secretions. Maintaining saturations recumbent.  Cortrak not flushing.  Objective   Blood pressure (!) 112/47, pulse 85, temperature 97.7 F (36.5 C), temperature source Axillary, resp. rate 13, height _0  (1.727 m), weight (!) 149 kg, SpO2 92 %. CVP:  [8 mmHg-14 mmHg] 8 mmHg  Vent Mode: Bi-Vent FiO2 (%):  [100 %] 100 % Set Rate:  [15 bmp] 15 bmp PEEP:  [15 cmH20] 15 cmH20 Plateau Pressure:  [28 cmH20-29  cmH20] 28 cmH20   Intake/Output Summary (Last 24 hours) at 08/29/2019 1117 Last data filed at 08/29/2019 1000 Gross per 24 hour  Intake 5676.57 ml  Output 8815 ml  Net -3138.43 ml   Filed Weights   08/27/19 0500 08/28/19 0500 08/29/19 0500  Weight: (!) 149.5 kg (!) 156.4 kg (!) 149 kg   Examination GEN: obese ill man on vent HEENT: trach in place, small thick mucoid secretions,removed nasal packing  now more clear serous drainage from nose CV: RRR, ext warm PULM: poor air movement, passive on vent.  Bronchial breath sounds anteriorly GI: Soft, + BS EXT: Global anascarca NEURO: sedated, incomplete paralysis, still coughs with suctioning PSYCH: RASS -4 SKIN: pale Femoral cannulas in place, sites look okay Bilateral chest tubes in place, no tidaling or air leak, ongoing serosanguinous output Foley in place dark yellow urine Rectal tube in place with tan loose stool   CXR improving oxygenation of upper lung fields with dense consolidation at bases bilaterally.   Flow (LPM): 5.33 ECMO Mode: VV ECMO Device: Cardiohelp   Vent Mode: Bi-Vent (P High 30), Set Rate: 15 bmp, FiO2 (%): 100 %, PEEP: 15 cmH20  Net IO Since Admission: 40,326.37 mL [08/29/19 1332]  Resolved Hospital Problem list    Assessment & Plan:    Acute hypoxemic respiratory failure with ARDS secondary to COVID-19, s/p tracheostomy requiring mechanical ventilation and VV ECMO support.  COVID has been treated with all evidence based therapies, we are dealing with lingering severe diffuse alveolar damage.  -Continue ECMO at current flow rate.  -Bronchoscopy for secretion/clot clearance as needed -Continue to follow Vt on Bivent. Keep Driving pressure 15 of less. -Heparin with 0.3-0.5 goal  -  Increase diuretic rate to achieve net negative fluid status   MSSA and serratia marcescens pneumonia:  cefepime and vancomycin completed for 10 days Still has serratia on BAL but no signs of infection   Bilateral  pneumothoraces and pneumomediastinum No leak today Continue suction  -20cmH2O  Posterior epistaxis.  Repacked multiple times  Cortrack removed and epistaxis appears to have resolved.   Daily Goals Checklist  Pain/Anxiety/Delirium protocol (if indicated): see discussion above VAP protocol (if indicated): Bundle in place. Respiratory support goals: Continue Bivent with ultra-lung protective ventilation, generous PEEP Blood pressure target:  MAP>65, off pressors DVT prophylaxis: on systemic anticoagulation on VV-ECMO -  Nutrition Status: Nutrition Problem: Increased nutrient needs Etiology: acute illness(COVID PNA) Signs/Symptoms: estimated needs Interventions: Tube feeding, Prostat GI prophylaxis: Protonix per tube. Fluid status goals: even if allowed by pven Urinary catheter: Guide hemodynamic management Glucose control: euglycemic on SSI. Mobility/therapy needs: Bedrest.  Daily labs: as per ECMO protocol. Code Status: Full code  Family Communication: updated mother at the bedside today. Disposition: ICU   Labs   CBC: Recent Labs  Lab 08/25/19 0445 08/25/19 0452 08/26/19 0306 08/26/19 0458 08/27/19 0448 08/27/19 0456 08/27/19 1700 08/27/19 2107 08/28/19 0406 08/28/19 0418 08/28/19 1649 08/28/19 1651 08/28/19 2355 08/29/19 0355 08/29/19 0403 08/29/19 0729 08/29/19 0810  WBC 4.5   < > 5.2   < > 6.4  --  7.8  --  8.5  --  10.0  --   --  12.5*  --   --   --   NEUTROABS 1.1*  --  2.1  --  2.4  --   --   --  3.6  --   --   --   --  5.5  --   --   --   HGB 8.0*   < > 8.1*   < > 8.4*   < > 8.7*   < > 8.6*   < > 8.8*   < > 8.8* 9.3* 9.5* 12.2* 10.2*  HCT 27.2*   < > 27.5*   < > 27.5*   < > 29.4*   < > 28.7*   < > 29.2*   < > 26.0* 31.0* 28.0* 36.0* 30.0*  MCV 98.2   < > 97.5   < > 95.8  --  97.4  --  95.7  --  95.1  --   --  93.7  --   --   --   PLT 90*   < > 92*   < > 92*  --  96*  --  101*  --  95*  --   --  108*  --   --   --    < > = values in this interval not  displayed.    Basic Metabolic Panel: Recent Labs  Lab 08/24/19 0352 08/24/19 0401 08/25/19 0445 08/25/19 0452 08/27/19 0007 08/27/19 0456 08/27/19 1700 08/27/19 2107 08/28/19 0406 08/28/19 0418 08/28/19 1649 08/28/19 1651 08/28/19 2355 08/29/19 0403 08/29/19 0407 08/29/19 0729 08/29/19 0810  NA 144   < > 148*   < > 146*   < > 144   < > 142   < > 146*   < > 148* 147* 145 147* 147*  K 3.9   < > 4.1   < > 4.1   < > 4.2   < > 3.7   < > 3.6   < > 3.0* 3.1* 3.1* 2.8* 3.1*  CL 109   < > 113*   < >  112*  --  111  --  108  --  109  --   --   --  107  --   --   CO2 24   < > 25   < > 24  --  23  --  23  --  24  --   --   --  23  --   --   GLUCOSE 176*   < > 155*   < > 177*  --  213*  --  187*  --  114*  --   --   --  128*  --   --   BUN 38*   < > 39*   < > 38*  --  41*  --  39*  --  40*  --   --   --  34*  --   --   CREATININE 0.35*   < > 0.43*   < > 0.45*  --  0.60*  --  0.63  --  0.62  --   --   --  0.60*  --   --   CALCIUM 9.0   < > 9.3   < > 9.2  --  9.3  --  8.9  --  9.1  --   --   --  9.1  --   --   MG 1.8  --  2.0  --   --   --   --   --   --   --   --   --   --   --   --   --   --    < > = values in this interval not displayed.   GFR: Estimated Creatinine Clearance: 165.9 mL/min (A) (by C-G formula based on SCr of 0.6 mg/dL (L)). Recent Labs  Lab 08/26/19 0306 08/26/19 1646 08/27/19 0448 08/27/19 0448 08/27/19 1700 08/28/19 0406 08/28/19 1649 08/29/19 0355  WBC 5.2   < > 6.4   < > 7.8 8.5 10.0 12.5*  LATICACIDVEN 1.1  --  1.1  --   --  1.2  --  0.9   < > = values in this interval not displayed.    Liver Function Tests: Recent Labs  Lab 08/23/19 0335 08/24/19 0352 08/26/19 0320  AST 18 19 43*  ALT 23 27 65*  ALKPHOS 52 54 68  BILITOT 0.8 0.8 1.0  PROT 6.1* 5.9* 6.3*  ALBUMIN 3.0* 2.6* 3.4*   No results for input(s): LIPASE, AMYLASE in the last 168 hours. No results for input(s): AMMONIA in the last 168 hours.  ABG    Component Value Date/Time   PHART  7.484 (H) 08/29/2019 0810   PCO2ART 35.4 08/29/2019 0810   PO2ART 48.0 (L) 08/29/2019 0810   HCO3 26.8 08/29/2019 0810   TCO2 28 08/29/2019 0810   ACIDBASEDEF 2.0 08/28/2019 1519   O2SAT 88.0 08/29/2019 0810     Coagulation Profile: Recent Labs  Lab 08/23/19 0335 08/24/19 0352 08/28/19 0406  INR 1.3* 1.3* 1.4*    Cardiac Enzymes: No results for input(s): CKTOTAL, CKMB, CKMBINDEX, TROPONINI in the last 168 hours.  HbA1C: Hgb A1c MFr Bld  Date/Time Value Ref Range Status  07/24/2019 04:50 AM 6.7 (H) 4.8 - 5.6 % Final    Comment:    (NOTE) Pre diabetes:          5.7%-6.4% Diabetes:              >6.4% Glycemic control for   <  7.0% adults with diabetes   09/30/2017 05:36 AM 5.7 (H) 4.8 - 5.6 % Final    Comment:    (NOTE)         Prediabetes: 5.7 - 6.4         Diabetes: >6.4         Glycemic control for adults with diabetes: <7.0     CBG: Recent Labs  Lab 08/28/19 1649 08/28/19 1959 08/28/19 2353 08/29/19 0401 08/29/19 0727  GLUCAP 109* 96 111* 117* 97    CRITICAL CARE Performed by: Kipp Brood   Total critical care time: 50 minutes  Critical care time was exclusive of separately billable procedures and treating other patients.  Critical care was necessary to treat or prevent imminent or life-threatening deterioration.  Critical care was time spent personally by me on the following activities: development of treatment plan with patient and/or surrogate as well as nursing, discussions with consultants, evaluation of patient's response to treatment, examination of patient, obtaining history from patient or surrogate, ordering and performing treatments and interventions, ordering and review of laboratory studies, ordering and review of radiographic studies, pulse oximetry, re-evaluation of patient's condition and participation in multidisciplinary rounds.  Kipp Brood, MD [facility] ICU Physician [place]  Pager: [phone] Mobile:  [phone] After hours: [phone].     08/29/2019, 11:17 AM

## 2019-08-29 NOTE — Progress Notes (Signed)
Patient ID: [name], male   DOB: 1976/02/06, 44 y.o.   MRN: [id number]    Advanced Heart Failure Rounding Note   Subjective:    Circuit changed and trach exchanged on 1/8.  Underwent addition of a RIJ drainage cannula on 1/20  Remains on VV ecmo with good flows.   On lasix gtt at 8 with excellent output -2.3L (total output 6.2L)  Remains sedated. On vent through trach  ABG 7.48/34/50/89%  Co-ox 71%  Lactate 0.9  CXR: Diffuse bilateral lung opacities c/w ARDS. Markedly improved overnight Personally reviewed  On vent 100% PEEP 15. PlatP 30, Bi-vent setting.TVs remain 70-80cc Personally reviewed  hgb 8.8 LDH 349  ECMO parameters Flow 5.3 RPM 3980 dP 38 Pressure -103   Objective:   Weight Range:  Vital Signs:   Temp:  [97.3 F (36.3 C)-99.4 F (37.4 C)] 97.7 F (36.5 C) (01/21 0800) Pulse Rate:  [69-89] 80 (01/21 0815) Resp:  [0-21] 18 (01/21 0815) SpO2:  [74 %-97 %] 94 % (01/21 0815) Arterial Line BP: (90-166)/(42-58) 141/47 (01/21 0815) FiO2 (%):  [100 %] 100 % (01/21 0800) Weight:  [149 kg] 149 kg (01/21 0500) Last BM Date: 08/28/19  Weight change: Filed Weights   08/27/19 0500 08/28/19 0500 08/29/19 0500  Weight: (!) 149.5 kg (!) 156.4 kg (!) 149 kg    Intake/Output:   Intake/Output Summary (Last 24 hours) at 08/29/2019 0827 Last data filed at 08/29/2019 0800 Gross per 24 hour  Intake 6342.52 ml  Output 8570 ml  Net -2227.48 ml     Physical Exam: General: Sedated on vent. In standing position HEENT: normal Neck: supple. RIJ drainage cannula ok. Carotids 2+ bilat; no bruits. No lymphadenopathy or thryomegaly appreciated. Cor: PMI nondisplaced. Regular rate & rhythm. No rubs, gallops or murmurs. Lungs: reduced air movement Abdomen: obese soft, nontender, nondistended. No hepatosplenomegaly. No bruits or masses. Good bowel sounds. Extremities: no cyanosis, clubbing, rash, 2+ edema femoral cannuals ok Neuro: sedated on vent   Telemetry: Sinus  80s Personally reviewed  Labs: Basic Metabolic Panel: Recent Labs  Lab 08/24/19 0352 08/24/19 0401 08/25/19 0445 08/25/19 0452 08/27/19 0007 08/27/19 0456 08/27/19 1700 08/27/19 2107 08/28/19 0406 08/28/19 0418 08/28/19 1649 08/28/19 1651 08/28/19 2355 08/29/19 0403 08/29/19 0407  NA 144   < > 148*   < > 146*   < > 144   < > 142   < > 146* 147* 148* 147* 145  K 3.9   < > 4.1   < > 4.1   < > 4.2   < > 3.7   < > 3.6 3.5 3.0* 3.1* 3.1*  CL 109   < > 113*   < > 112*  --  111  --  108  --  109  --   --   --  107  CO2 24   < > 25   < > 24  --  23  --  23  --  24  --   --   --  23  GLUCOSE 176*   < > 155*   < > 177*  --  213*  --  187*  --  114*  --   --   --  128*  BUN 38*   < > 39*   < > 38*  --  41*  --  39*  --  40*  --   --   --  34*  CREATININE 0.35*   < > 0.43*   < >  0.45*  --  0.60*  --  0.63  --  0.62  --   --   --  0.60*  CALCIUM 9.0   < > 9.3   < > 9.2   < > 9.3   < > 8.9  --  9.1  --   --   --  9.1  MG 1.8  --  2.0  --   --   --   --   --   --   --   --   --   --   --   --    < > = values in this interval not displayed.    Liver Function Tests: Recent Labs  Lab 08/23/19 0335 08/24/19 0352 08/26/19 0320  AST 18 19 43*  ALT 23 27 65*  ALKPHOS 52 54 68  BILITOT 0.8 0.8 1.0  PROT 6.1* 5.9* 6.3*  ALBUMIN 3.0* 2.6* 3.4*   No results for input(s): LIPASE, AMYLASE in the last 168 hours. No results for input(s): AMMONIA in the last 168 hours.  CBC: Recent Labs  Lab 08/25/19 0445 08/25/19 0452 08/26/19 0306 08/26/19 0458 08/27/19 0448 08/27/19 0456 08/27/19 1700 08/27/19 2107 08/28/19 0406 08/28/19 0418 08/28/19 1649 08/28/19 1651 08/28/19 2355 08/29/19 0355 08/29/19 0403  WBC 4.5   < > 5.2   < > 6.4  --  7.8  --  8.5  --  10.0  --   --  12.5*  --   NEUTROABS 1.1*  --  2.1  --  2.4  --   --   --  3.6  --   --   --   --  5.5  --   HGB 8.0*   < > 8.1*   < > 8.4*   < > 8.7*   < > 8.6*   < > 8.8* 9.5* 8.8* 9.3* 9.5*  HCT 27.2*   < > 27.5*   < > 27.5*   < >  29.4*   < > 28.7*   < > 29.2* 28.0* 26.0* 31.0* 28.0*  MCV 98.2   < > 97.5   < > 95.8  --  97.4  --  95.7  --  95.1  --   --  93.7  --   PLT 90*   < > 92*   < > 92*  --  96*  --  101*  --  95*  --   --  108*  --    < > = values in this interval not displayed.    Cardiac Enzymes: No results for input(s): CKTOTAL, CKMB, CKMBINDEX, TROPONINI in the last 168 hours.  BNP: BNP (last 3 results) Recent Labs    07/22/19 1039  BNP 15.3    ProBNP (last 3 results) No results for input(s): PROBNP in the last 8760 hours.    Other results:  Imaging: DG Chest 1 View  Result Date: 08/28/2019 CLINICAL DATA:  Portable chest in OR.  Cannulation for ECMO. EXAM: CHEST  1 VIEW COMPARISON:  08/28/2019 FINDINGS: Endotracheal tube has tip approximately 4.2 cm above the carina. Right IJ large-bore central venous catheter with tip over the SVC. Left subclavian central venous catheter is obliquely oriented at the junction of the left breast valve vein to SVC. Enteric tube courses into the region of the stomach and off the film as tip is not visualized. Large bore catheter is seen over the upper abdomen with tip in the expected region of the inferior cavoatrial  junction. Bilateral chest tubes in place and unchanged. Right-sided PICC line is present as tip is not accurately visualized. Lungs demonstrate persistent severe bilateral opacification with air bronchograms without significant change. Remainder the exam is unchanged. IMPRESSION: 1. Severe bilateral airspace opacification with air bronchograms without significant change. 2.  Multiple tubes and lines as described. Electronically Signed   By: Marin Olp M.D.   On: 08/28/2019 16:07   DG Chest Port 1 View  Result Date: 08/29/2019 CLINICAL DATA:  ECMO. EXAM: PORTABLE CHEST 1 VIEW COMPARISON:  08/28/2019. FINDINGS: Tracheostomy tube, feeding tube, large bore right IJ and IVC catheters, left subclavian central line, right PICC line, and bilateral chest tubes in  stable position. No pneumothorax. Stable cardiomegaly. Low lung volumes. Diffuse severe bilateral pulmonary infiltrates again noted without interim change. No pleural effusion. IMPRESSION: 1. Lines and tubes including bilateral chest tubes in stable position. No pneumothorax. 2. Lung volumes. Diffuse severe bilateral pulmonary infiltrates again noted without interim change. 3.  Stable cardiomegaly. Electronically Signed   By: Marcello Moores  Register   On: 08/29/2019 06:26   DG CHEST PORT 1 VIEW  Result Date: 08/28/2019 CLINICAL DATA:  Feeding tube dysfunction EXAM: PORTABLE CHEST 1 VIEW COMPARISON:  Portable exam 1804 hours compared to 1546 hours FINDINGS: Tip of tracheostomy tube projects 4.5 cm above carina. Feeding tube extends into abdomen. Large-bore central catheters from subdiaphragmatic and RIGHT jugular approaches appear unchanged. BILATERAL thoracostomy tubes unchanged. LEFT subclavian central venous catheter with tip projecting over SVC. Extensive BILATERAL airspace consolidation with air bronchograms, minimally improved. No pneumothorax. IMPRESSION: Persistent severe diffuse BILATERAL airspace infiltrates, minimally improved. Electronically Signed   By: Lavonia Dana M.D.   On: 08/28/2019 20:05   DG Chest Port 1 View  Result Date: 08/28/2019 CLINICAL DATA:  Endotracheal tube EXAM: PORTABLE CHEST 1 VIEW COMPARISON:  Radiograph 08/27/2019 FINDINGS: Tracheostomy tube, feeding tube, and PICC line unchanged. Bilateral chest tubes in place. No pneumothorax. There is dense diffuse bilateral airspace disease not improved. Low lung volumes. IMPRESSION: 1. No significant change. 2. Stable support apparatus. 3. Dense airspace disease and low lung volumes. Electronically Signed   By: Suzy Bouchard M.D.   On: 08/28/2019 09:04   DG Abd Portable 1V  Result Date: 08/28/2019 CLINICAL DATA:  Feeding tube dysfunction EXAM: PORTABLE ABDOMEN - 1 VIEW COMPARISON:  Portable exam 1806 hours FINDINGS: Tip of feeding tube  projects over distal gastric antrum. Large-bore central venous catheters via femoral approach is, tip of 1 at T12, second above exam. Paucity of bowel gas. No osseous findings. IMPRESSION: Tip of feeding tube projects over distal gastric antrum. Electronically Signed   By: Lavonia Dana M.D.   On: 08/28/2019 20:05   HYBRID OR IMAGING ([facility] ONLY)  Result Date: 08/28/2019 There is no interpretation for this exam.  This order is for images obtained during a surgical procedure.  Please See "Surgeries" Tab for more information regarding the procedure.     Medications:     Scheduled Medications: . acetylcysteine  4 mL Nebulization Q4H  . artificial tears  1 application Both Eyes L9J  . vitamin C  500 mg Per Tube Daily  . chlorhexidine gluconate (MEDLINE KIT)  15 mL Mouth Rinse BID  . Chlorhexidine Gluconate Cloth  6 each Topical Daily  . diazepam  20 mg Per Tube Q6H  . feeding supplement (PRO-STAT SUGAR FREE 64)  60 mL Per Tube BID  . free water  300 mL Per Tube Q8H  . gabapentin  450 mg Per  Tube Q8H  . HYDROmorphone  8 mg Per Tube Q4H  . insulin aspart  3-9 Units Subcutaneous Q4H  . ipratropium-albuterol  3 mL Nebulization Q4H  . mouth rinse  15 mL Mouth Rinse 10 times per day  . montelukast  10 mg Per Tube QHS  . oxymetazoline  1 spray Each Nare BID  . pantoprazole sodium  40 mg Per Tube QHS  . QUEtiapine  200 mg Per Tube BID  . sodium chloride flush  10-40 mL Intracatheter Q12H  . sodium chloride flush  10-40 mL Intracatheter Q12H  . Thrombi-Pad  1 each Topical Once  . zinc sulfate  220 mg Per Tube Daily    Infusions: . sodium chloride    . sodium chloride Stopped (08/28/19 1228)  . sodium chloride 10 mL/hr at 08/28/19 1209  . albumin human Stopped (08/28/19 0537)  . cefTRIAXone (ROCEPHIN)  IV Stopped (08/28/19 1008)  . cisatracurium (NIMBEX) infusion 7 mcg/kg/min (08/29/19 0800)  . dexmedetomidine (PRECEDEX) IV infusion 1.6 mcg/kg/hr (08/29/19 0824)  . dextrose    . feeding  supplement (PIVOT 1.5 CAL) Stopped (08/28/19 1230)  . furosemide (LASIX) infusion 8 mg/hr (08/29/19 0800)  . heparin 2,700 Units/hr (08/29/19 0800)  . HYDROmorphone 20 mg/hr (08/29/19 0800)  . midazolam (VERSED) infusion 38 mg/hr (08/29/19 0800)  . norepinephrine (LEVOPHED) Adult infusion 1 mcg/min (08/29/19 0800)  . potassium chloride 10 mEq (08/29/19 0821)  . propofol (DIPRIVAN) infusion Stopped (08/21/19 1030)    PRN Medications: Place/Maintain arterial line **AND** sodium chloride, sodium chloride, sodium chloride, acetaminophen (TYLENOL) oral liquid 160 mg/5 mL, acetaminophen, albumin human, dextrose, hydrALAZINE, HYDROmorphone, magnesium hydroxide, metoprolol tartrate, midazolam, ondansetron (ZOFRAN) IV, senna-docusate, sodium chloride, sodium chloride flush, sodium chloride flush, sodium chloride flush   Assessment/Plan:    1. Acute hypoxic/hypercarbic respiratory failure due to COVID PNA: Remained markedly hypoxic despite full support. Intubated 12/26. S/p bilateral CTs 12/26 for pneumothoraces.  TEE on 12/26 LVEF 70% RV ok. VV ECMO begun 12/26. Tracheostomy 12/29.  COVID ARDS at this point.  Flows stable and sats improved with deeper sedation and re-paralysis. ECMO circuit changed 1/8 - ECMO parameters stable.  - RIJ venous drainage cannula added 1/20 - Flows look good.  - Excellent diuresis on IV lasix. Tolerating well - CXR and sats improving with diuresis and additional drainage cannula - Main issue is bad lungs. Continue maneuvers to improve lung compliance - chest PT, diuresis, standing position. Bronch as needed - Continue to wait on lung recovery - LDH 349 -> 465, stable   2. Bilateral PTX: Due to barotrauma.  - Driving pressure minimized at < 15 Stable. No change  3. COVID PNA: management of COVID infection per CCM. CXR with bilateral multifocal PNA progressing to likely ARDS.  - s/p 10 days of remdesivir - 12/16, 12/2 s/p tociluzimab - 12/17 s/p convalescent  plasma - Decadron at 48m/daily completed 1/11 - Completed cefepime/vanc - On ceftriaxone for nasal packing  4. ID: Empiric coverage with vancomycin/cefepime initially for possible secondary bacterial PNA, course completed.   - treated with cefepime and vanc for + cx with staph epidimidis, MSSA and serratia (sensitivities ok). - Cover with ceftriaxone for nasal packing - Remains stable - D/w PharmD  5. Thrombocytopenia: Slow decrease in platelets, likely low level hemolysis + critical illness.  - Stable 101k this am.  - On heaprin. No bleeding. Discussed dosing with PharmD.  6. Anemia: Bleeding at trach site resolved but still with epistaxis - hgb 9.5   today - Transfuse  hgb < 8  7. FEN - continue TFs  D/w with ECMO team (ECMO coordinator/specialists, CCM, TCTS and pharmD) at bedside on multidisciplinary rounds. Continue to await ARDS recovery. Details as above.   Length of Stay: 45   CRITICAL CARE Performed by: Glori Bickers  Total critical care time: 35 minutes  Critical care time was exclusive of separately billable procedures and treating other patients.  Critical care was necessary to treat or prevent imminent or life-threatening deterioration.  Critical care was time spent personally by me (independent of midlevel providers or residents) on the following activities: development of treatment plan with patient and/or surrogate as well as nursing, discussions with consultants, evaluation of patient's response to treatment, examination of patient, obtaining history from patient or surrogate, ordering and performing treatments and interventions, ordering and review of laboratory studies, ordering and review of radiographic studies, pulse oximetry and re-evaluation of patient's condition.    Glori Bickers MD 08/29/2019, 8:27 AM  Advanced Heart Failure Team Pager [phone] (M-F; 7a - 4p)  Please contact [facility] for night-coverage after hours (4p -7a ) and  weekends on [email or web address]

## 2019-08-29 NOTE — Progress Notes (Signed)
1 Day Post-Op Procedure(s) (LRB): CANNULATION FOR VV ECMO (EXTRACORPOREAL MEMBRANE OXYGENATION) (N/A) TRANSESOPHAGEAL ECHOCARDIOGRAM (TEE) (N/A) Subjective: Some improvement in oxygenation after additional 21 French SVC drainage cannula placed in OR. Chest x-ray today shows some improvement in upper lung aeration  Objective: Vital signs in last 24 hours: Temp:  [97.3 F (36.3 C)-99.4 F (37.4 C)] 97.7 F (36.5 C) (01/21 0800) Pulse Rate:  [71-89] 79 (01/21 1115) Cardiac Rhythm: Normal sinus rhythm (01/21 0800) Resp:  [0-21] 0 (01/21 1115) SpO2:  [76 %-97 %] 91 % (01/21 1130) Arterial Line BP: (106-166)/(42-58) 136/50 (01/21 1115) FiO2 (%):  [100 %] 100 % (01/21 1127) Weight:  [149 kg] 149 kg (01/21 0500)  Hemodynamic parameters for last 24 hours: CVP:  [8 mmHg-14 mmHg] 8 mmHg  Intake/Output from previous day: 01/20 0701 - 01/21 0700 In: 6209.3 [I.V.:4844.1; Blood:315; NG/GT:650; IV Piggyback:400.1] Out: 8490 [Urine:7880; Stool:200; Blood:100; Chest Tube:310] Intake/Output this shift: Total I/O In: 1247.6 [I.V.:1012.2; IV Piggyback:235.4] Out: 1440 [Urine:1350; Chest Tube:90]  Exam Cannulation sites clean dry without hematoma and secure  Lab Results: Recent Labs    08/28/19 1649 08/28/19 1651 08/29/19 0355 08/29/19 0403 08/29/19 0729 08/29/19 0810  WBC 10.0  --  12.5*  --   --   --   HGB 8.8*   < > 9.3*   < > 12.2* 10.2*  HCT 29.2*   < > 31.0*   < > 36.0* 30.0*  PLT 95*  --  108*  --   --   --    < > = values in this interval not displayed.   BMET:  Recent Labs    08/28/19 1649 08/28/19 1651 08/29/19 0407 08/29/19 0407 08/29/19 0729 08/29/19 0810  NA 146*   < > 145   < > 147* 147*  K 3.6   < > 3.1*   < > 2.8* 3.1*  CL 109  --  107  --   --   --   CO2 24  --  23  --   --   --   GLUCOSE 114*  --  128*  --   --   --   BUN 40*  --  34*  --   --   --   CREATININE 0.62  --  0.60*  --   --   --   CALCIUM 9.1  --  9.1  --   --   --    < > = values in this  interval not displayed.    PT/INR:  Recent Labs    08/28/19 0406  LABPROT 17.0*  INR 1.4*   ABG    Component Value Date/Time   PHART 7.484 (H) 08/29/2019 0810   HCO3 26.8 08/29/2019 0810   TCO2 28 08/29/2019 0810   ACIDBASEDEF 2.0 08/28/2019 1519   O2SAT 88.0 08/29/2019 0810   CBG (last 3)  Recent Labs    08/29/19 0401 08/29/19 0727 08/29/19 1142  GLUCAP 117* 97 120*    Assessment/Plan: S/P Procedure(s) (LRB): CANNULATION FOR VV ECMO (EXTRACORPOREAL MEMBRANE OXYGENATION) (N/A) TRANSESOPHAGEAL ECHOCARDIOGRAM (TEE) (N/A) Continue VV ECMO for Covid pneumonitis Lasix drip-diuresis improving oxygenation.   LOS: 38 days    Tharon Aquas Trigt III 08/29/2019

## 2019-08-29 NOTE — Progress Notes (Signed)
PT Cancellation Note  Patient Details Name: [name] MRN: [id number] DOB: 1976-06-03   Cancelled Treatment:    Reason Eval/Treat Not Completed: Patient not medically ready. Pt remains sedated and on paralytics at this time. Nursing staff performing tilting therapy on schedule with patient. Acute PT will sign off at this time, please re-consult PT when patient is able to follow commands and participate in skilled PT intervention.    Zenaida Niece 08/29/2019, 7:58 AM

## 2019-08-29 NOTE — Op Note (Signed)
[name], [name]:[id number] ACCOUNT [id number] DATE OF BIRTH:[birth year] FACILITY: [facility] LOCATION: [facility] PHYSICIAN:Jareli Highland VAN TRIGT III, MD  OPERATIVE REPORT  DATE OF PROCEDURE:  08/28/2019  OPERATION: 1.  Placement of VV extracorporeal membrane oxygenation cannula in the right internal jugular, 21-French Medtronic drainage cannula placed using fluoroscopy and Seldinger technique. 2.  Placement of central line in the left subclavian vein.  SURGEON:  Ivin Poot, MD  ASSISTANT:  Glori Bickers, MD   PREOPERATIVE DIAGNOSIS:  COVID pneumonitis with acute respiratory distress syndrome on VV extracorporeal membrane oxygenation from femoral venous cannulas.  POSTOPERATIVE DIAGNOSIS:  COVID pneumonitis with acute respiratory distress syndrome on VV extracorporeal membrane oxygenation from femoral venous cannulas.  ANESTHESIA:  General.  CLINICAL NOTE:  Because of the patient's poor oxygenation despite maximal flows with the VV ECMO configuration used, it was recommended the patient have an additional drainage cannula placed in the SVC to improve drainage in oxygenation.  I discussed the  procedure with the patient's wife and informed consent was obtained.  DESCRIPTION OF PROCEDURE:  The patient was brought directly from the ICU to the operating room, sedated on the ventilator on ECMO and with inhaled nitric oxide.  He remained stable.  He was placed on the OR table carefully.  A TTE was placed and  biventricular function appeared to be adequate.  The patient's chest, neck, abdomen and groins were prepped and draped as a sterile field.  A proper time-out was performed.  Using the Seldinger technique and fluoroscopy, the right IJ was cannulated and a guidewire was passed and confirmed to pass straight into the IVC.  Over this guidewire, dilators and then the 21-French Medtronic venous drainage cannula were placed.  The  cannula was placed in position in the mid  atrium, adequately distance from the return cannula, bringing oxygenated blood from the ECMO circuit.  It was secured to the skin with multiple heavy sutures.  We next Y'd the new cannula and its tubing into the drainage line of the ECMO circuit, quickly clamping and placing a Y connector to connect the femoral and the SVC drainage cannulas to the ECMO circuit.  Next, a double lumen central line was placed in the left subclavian vein using the Seldinger technique and the wire and position was confirmed by fluoroscopy.  This was flushed and secured to the skin.  Next, the patient had a chest x-ray in the operating room showing the cannulas and lines to be in good position before he was transported back to the ICU in critical condition.  The sterile dressings had been applied to the cannulation sites and the new  central line.  VN/NUANCE  D:08/28/2019 T:08/28/2019 JOB:[id number]

## 2019-08-29 NOTE — Progress Notes (Signed)
Agarwala MD notified of patient vomiting tube feeds. Tube feeds discontinued and OGT connected to LIS. Will only start D10 if patient's CBGs drop. Will reassess tomorrow.

## 2019-08-29 NOTE — Progress Notes (Signed)
SLP Cancellation Note  Patient Details Name: [name] MRN: [id number] DOB: Oct 19, 1975   Cancelled treatment:       Reason Eval/Treat Not Completed: Patient not medically ready   Bach Rocchi, Katherene Ponto 08/29/2019, 8:37 AM

## 2019-08-29 NOTE — Progress Notes (Signed)
ANTICOAGULATION CONSULT NOTE - Follow Up Consult  Pharmacy Consult for heparin Indication: ECMO  Patient Measurements: Height: 5' 8"  (172.7 cm) Weight: (!) 328 lb 7.8 oz (149 kg) IBW/kg (Calculated) : 68.4 Heparin Dosing Weight: 104.5kg  Vital Signs: Temp: 97.5 F (36.4 C) (01/21 1600) Temp Source: Axillary (01/21 1600) Pulse Rate: 87 (01/21 1700)  Labs: Recent Labs    08/28/19 0406 08/28/19 0418 08/28/19 1649 08/28/19 1651 08/29/19 0355 08/29/19 0403 08/29/19 0407 08/29/19 0729 08/29/19 0729 08/29/19 0810 08/29/19 1200 08/29/19 1600  HGB 8.6*   < > 8.8*   < > 9.3*   < >  --  12.2*   < > 10.2*  --  9.2*  9.9*  HCT 28.7*   < > 29.2*   < > 31.0*   < >  --  36.0*  --  30.0*  --  29.5*  29.0*  PLT 101*   < > 95*  --  108*  --   --   --   --   --   --  108*  APTT 102*   < > 59*  --  89*  --   --   --   --   --   --  80*  LABPROT 17.0*  --   --   --   --   --   --   --   --   --   --   --   INR 1.4*  --   --   --   --   --   --   --   --   --   --   --   HEPARINUNFRC 0.30   < > 0.13*   < > 0.24*  --   --   --   --   --  0.36 0.31  CREATININE 0.63   < > 0.62  --   --   --  0.60*  --   --   --   --  0.64   < > = values in this interval not displayed.    Assessment: 44yo male on VV ECMO on heparin infusion.   At low end of therapeutic range on heparin. No infusion issues, small nose bleed on/off with packing changed, otherwise no bleeding per RN. Will increase rate slightly to keep in goal.   Goal of Therapy:  Heparin level 0.3-0.5 units/ml   Plan:  Increase heparin gtt to 2750 units/hr  Monitor daily HL, CBC, plt Monitor for signs/symptoms of bleeding    Benetta Spar, PharmD, BCPS, [facility] Clinical Pharmacist  Please check AMION for all [place] phone numbers After 10:00 PM, call [place]

## 2019-08-29 NOTE — Progress Notes (Signed)
New OGT advanced 5 cm. Okay with Agarwala MD to use.

## 2019-08-30 ENCOUNTER — Inpatient Hospital Stay (HOSPITAL_COMMUNITY): Payer: Managed Care, Other (non HMO)

## 2019-08-30 LAB — POCT I-STAT 7, (LYTES, BLD GAS, ICA,H+H)
Acid-Base Excess: 10 mmol/L — ABNORMAL HIGH (ref 0.0–2.0)
Acid-Base Excess: 9 mmol/L — ABNORMAL HIGH (ref 0.0–2.0)
Acid-Base Excess: 7 mmol/L — ABNORMAL HIGH (ref 0.0–2.0)
Acid-Base Excess: 6 mmol/L — ABNORMAL HIGH (ref 0.0–2.0)
Acid-Base Excess: 8 mmol/L — ABNORMAL HIGH (ref 0.0–2.0)
Acid-Base Excess: 9 mmol/L — ABNORMAL HIGH (ref 0.0–2.0)
Bicarbonate: 33 mmol/L — ABNORMAL HIGH (ref 20.0–28.0)
Bicarbonate: 32.5 mmol/L — ABNORMAL HIGH (ref 20.0–28.0)
Bicarbonate: 30.6 mmol/L — ABNORMAL HIGH (ref 20.0–28.0)
Bicarbonate: 29.9 mmol/L — ABNORMAL HIGH (ref 20.0–28.0)
Bicarbonate: 31.2 mmol/L — ABNORMAL HIGH (ref 20.0–28.0)
Bicarbonate: 30.7 mmol/L — ABNORMAL HIGH (ref 20.0–28.0)
Calcium, Ion: 1.16 mmol/L (ref 1.15–1.40)
Calcium, Ion: 1.16 mmol/L (ref 1.15–1.40)
Calcium, Ion: 1.2 mmol/L (ref 1.15–1.40)
Calcium, Ion: 1.17 mmol/L (ref 1.15–1.40)
Calcium, Ion: 1.18 mmol/L (ref 1.15–1.40)
Calcium, Ion: 1.13 mmol/L — ABNORMAL LOW (ref 1.15–1.40)
HCT: 29 % — ABNORMAL LOW (ref 39.0–52.0)
HCT: 27 % — ABNORMAL LOW (ref 39.0–52.0)
HCT: 31 % — ABNORMAL LOW (ref 39.0–52.0)
HCT: 32 % — ABNORMAL LOW (ref 39.0–52.0)
HCT: 33 % — ABNORMAL LOW (ref 39.0–52.0)
HCT: 29 % — ABNORMAL LOW (ref 39.0–52.0)
Hemoglobin: 9.9 g/dL — ABNORMAL LOW (ref 13.0–17.0)
Hemoglobin: 9.2 g/dL — ABNORMAL LOW (ref 13.0–17.0)
Hemoglobin: 10.5 g/dL — ABNORMAL LOW (ref 13.0–17.0)
Hemoglobin: 10.9 g/dL — ABNORMAL LOW (ref 13.0–17.0)
Hemoglobin: 11.2 g/dL — ABNORMAL LOW (ref 13.0–17.0)
Hemoglobin: 9.9 g/dL — ABNORMAL LOW (ref 13.0–17.0)
O2 Saturation: 92 %
O2 Saturation: 93 %
O2 Saturation: 91 %
O2 Saturation: 100 %
O2 Saturation: 85 %
O2 Saturation: 91 %
Patient temperature: 97.3
Patient temperature: 97.3
Patient temperature: 97.8
Patient temperature: 36.5
Patient temperature: 98.2
Patient temperature: 97.8
Potassium: 3.7 mmol/L (ref 3.5–5.1)
Potassium: 3.3 mmol/L — ABNORMAL LOW (ref 3.5–5.1)
Potassium: 3.2 mmol/L — ABNORMAL LOW (ref 3.5–5.1)
Potassium: 3.7 mmol/L (ref 3.5–5.1)
Potassium: 3.7 mmol/L (ref 3.5–5.1)
Potassium: 3.6 mmol/L (ref 3.5–5.1)
Sodium: 144 mmol/L (ref 135–145)
Sodium: 144 mmol/L (ref 135–145)
Sodium: 143 mmol/L (ref 135–145)
Sodium: 142 mmol/L (ref 135–145)
Sodium: 143 mmol/L (ref 135–145)
Sodium: 143 mmol/L (ref 135–145)
TCO2: 34 mmol/L — ABNORMAL HIGH (ref 22–32)
TCO2: 34 mmol/L — ABNORMAL HIGH (ref 22–32)
TCO2: 32 mmol/L (ref 22–32)
TCO2: 31 mmol/L (ref 22–32)
TCO2: 32 mmol/L (ref 22–32)
TCO2: 32 mmol/L (ref 22–32)
pCO2 arterial: 37.7 mmHg (ref 32.0–48.0)
pCO2 arterial: 36.7 mmHg (ref 32.0–48.0)
pCO2 arterial: 37.2 mmHg (ref 32.0–48.0)
pCO2 arterial: 37.6 mmHg (ref 32.0–48.0)
pCO2 arterial: 40 mmHg (ref 32.0–48.0)
pCO2 arterial: 31.7 mmHg — ABNORMAL LOW (ref 32.0–48.0)
pH, Arterial: 7.548 — ABNORMAL HIGH (ref 7.350–7.450)
pH, Arterial: 7.552 — ABNORMAL HIGH (ref 7.350–7.450)
pH, Arterial: 7.522 — ABNORMAL HIGH (ref 7.350–7.450)
pH, Arterial: 7.482 — ABNORMAL HIGH (ref 7.350–7.450)
pH, Arterial: 7.526 — ABNORMAL HIGH (ref 7.350–7.450)
pH, Arterial: 7.592 — ABNORMAL HIGH (ref 7.350–7.450)
pO2, Arterial: 53 mmHg — ABNORMAL LOW (ref 83.0–108.0)
pO2, Arterial: 55 mmHg — ABNORMAL LOW (ref 83.0–108.0)
pO2, Arterial: 53 mmHg — ABNORMAL LOW (ref 83.0–108.0)
pO2, Arterial: 242 mmHg — ABNORMAL HIGH (ref 83.0–108.0)
pO2, Arterial: 46 mmHg — ABNORMAL LOW (ref 83.0–108.0)
pO2, Arterial: 49 mmHg — ABNORMAL LOW (ref 83.0–108.0)

## 2019-08-30 LAB — BPAM RBC
Blood Product Expiration Date: 202102122359
Blood Product Expiration Date: 202102122359
Blood Product Expiration Date: 202102122359
Blood Product Expiration Date: 202102162359
Blood Product Expiration Date: 202102232359
Blood Product Expiration Date: 202102232359
Blood Product Expiration Date: 202102232359
Blood Product Expiration Date: 202102232359
Blood Product Expiration Date: 202102232359
ISSUE DATE / TIME: 202101201155
ISSUE DATE / TIME: 202101221426
ISSUE DATE / TIME: 202101221426
ISSUE DATE / TIME: 202101221426
ISSUE DATE / TIME: 202101222016
Unit Type and Rh: 5100
Unit Type and Rh: 5100
Unit Type and Rh: 5100
Unit Type and Rh: 5100
Unit Type and Rh: 5100
Unit Type and Rh: 5100
Unit Type and Rh: 5100
Unit Type and Rh: 5100
Unit Type and Rh: 5100

## 2019-08-30 LAB — BASIC METABOLIC PANEL
Anion gap: 14 (ref 5–15)
Anion gap: 15 (ref 5–15)
Anion gap: 12 (ref 5–15)
BUN: 30 mg/dL — ABNORMAL HIGH (ref 6–20)
BUN: 29 mg/dL — ABNORMAL HIGH (ref 6–20)
BUN: 30 mg/dL — ABNORMAL HIGH (ref 6–20)
CO2: 28 mmol/L (ref 22–32)
CO2: 28 mmol/L (ref 22–32)
CO2: 27 mmol/L (ref 22–32)
Calcium: 8.9 mg/dL (ref 8.9–10.3)
Calcium: 9.1 mg/dL (ref 8.9–10.3)
Calcium: 8.7 mg/dL — ABNORMAL LOW (ref 8.9–10.3)
Chloride: 101 mmol/L (ref 98–111)
Chloride: 100 mmol/L (ref 98–111)
Chloride: 99 mmol/L (ref 98–111)
Creatinine, Ser: 0.67 mg/dL (ref 0.61–1.24)
Creatinine, Ser: 0.71 mg/dL (ref 0.61–1.24)
Creatinine, Ser: 0.73 mg/dL (ref 0.61–1.24)
GFR calc Af Amer: >60 mL/min (ref >60)
GFR calc Af Amer: >60 mL/min (ref >60)
GFR calc Af Amer: >60 mL/min (ref >60)
GFR calc non Af Amer: >60 mL/min (ref >60)
GFR calc non Af Amer: >60 mL/min (ref >60)
GFR calc non Af Amer: >60 mL/min (ref >60)
Glucose, Bld: 123 mg/dL — ABNORMAL HIGH (ref 70–99)
Glucose, Bld: 153 mg/dL — ABNORMAL HIGH (ref 70–99)
Glucose, Bld: 156 mg/dL — ABNORMAL HIGH (ref 70–99)
Potassium: 3.3 mmol/L — ABNORMAL LOW (ref 3.5–5.1)
Potassium: 3.8 mmol/L (ref 3.5–5.1)
Potassium: 3.2 mmol/L — ABNORMAL LOW (ref 3.5–5.1)
Sodium: 143 mmol/L (ref 135–145)
Sodium: 143 mmol/L (ref 135–145)
Sodium: 138 mmol/L (ref 135–145)

## 2019-08-30 LAB — TYPE AND SCREEN
ABO/RH(D): O POS
Antibody Screen: NEGATIVE
Unit division: 0
Unit division: 0
Unit division: 0
Unit division: 0
Unit division: 0
Unit division: 0
Unit division: 0
Unit division: 0
Unit division: 0

## 2019-08-30 LAB — CBC WITH DIFFERENTIAL/PLATELET
Abs Immature Granulocytes: 2.84 10*3/uL — ABNORMAL HIGH (ref 0.00–0.07)
Basophils Absolute: 0.1 10*3/uL (ref 0.0–0.1)
Basophils Relative: 1 %
Eosinophils Absolute: 0.4 10*3/uL (ref 0.0–0.5)
Eosinophils Relative: 4 %
HCT: 31.8 % — ABNORMAL LOW (ref 39.0–52.0)
Hemoglobin: 9.9 g/dL — ABNORMAL LOW (ref 13.0–17.0)
Immature Granulocytes: 25 %
Lymphocytes Relative: 15 %
Lymphs Abs: 1.8 10*3/uL (ref 0.7–4.0)
MCH: 28.8 pg (ref 26.0–34.0)
MCHC: 31.1 g/dL (ref 30.0–36.0)
MCV: 92.4 fL (ref 80.0–100.0)
Monocytes Absolute: 0.9 10*3/uL (ref 0.1–1.0)
Monocytes Relative: 8 %
Neutro Abs: 5.5 10*3/uL (ref 1.7–7.7)
Neutrophils Relative %: 47 %
Platelets: 109 10*3/uL — ABNORMAL LOW (ref 150–400)
RBC: 3.44 MIL/uL — ABNORMAL LOW (ref 4.22–5.81)
RDW: 17.8 % — ABNORMAL HIGH (ref 11.5–15.5)
WBC: 11.4 10*3/uL — ABNORMAL HIGH (ref 4.0–10.5)
nRBC: 1.6 % — ABNORMAL HIGH (ref 0.0–0.2)

## 2019-08-30 LAB — CBC
HCT: 29.5 % — ABNORMAL LOW (ref 39.0–52.0)
Hemoglobin: 9.2 g/dL — ABNORMAL LOW (ref 13.0–17.0)
MCH: 28.6 pg (ref 26.0–34.0)
MCHC: 31.2 g/dL (ref 30.0–36.0)
MCV: 91.6 fL (ref 80.0–100.0)
Platelets: 111 10*3/uL — ABNORMAL LOW (ref 150–400)
RBC: 3.22 MIL/uL — ABNORMAL LOW (ref 4.22–5.81)
RDW: 17.8 % — ABNORMAL HIGH (ref 11.5–15.5)
WBC: 12.3 10*3/uL — ABNORMAL HIGH (ref 4.0–10.5)
nRBC: 1.1 % — ABNORMAL HIGH (ref 0.0–0.2)

## 2019-08-30 LAB — PATHOLOGIST SMEAR REVIEW

## 2019-08-30 LAB — LACTIC ACID, PLASMA: Lactic Acid, Venous: 1.2 mmol/L (ref 0.5–1.9)

## 2019-08-30 LAB — GLUCOSE, CAPILLARY
Glucose-Capillary: 116 mg/dL — ABNORMAL HIGH (ref 70–99)
Glucose-Capillary: 123 mg/dL — ABNORMAL HIGH (ref 70–99)
Glucose-Capillary: 133 mg/dL — ABNORMAL HIGH (ref 70–99)
Glucose-Capillary: 141 mg/dL — ABNORMAL HIGH (ref 70–99)
Glucose-Capillary: 148 mg/dL — ABNORMAL HIGH (ref 70–99)
Glucose-Capillary: 150 mg/dL — ABNORMAL HIGH (ref 70–99)

## 2019-08-30 LAB — COOXEMETRY PANEL
Carboxyhemoglobin: 4.7 % — ABNORMAL HIGH (ref 0.5–1.5)
Methemoglobin: 0.8 % (ref 0.0–1.5)
O2 Saturation: 71.4 %
Total hemoglobin: 9.8 g/dL — ABNORMAL LOW (ref 12.0–16.0)

## 2019-08-30 LAB — LACTATE DEHYDROGENASE
LDH: 547 U/L — ABNORMAL HIGH (ref 98–192)
LDH: 707 U/L — ABNORMAL HIGH (ref 98–192)

## 2019-08-30 LAB — HEPARIN LEVEL (UNFRACTIONATED)
Heparin Unfractionated: 0.37 IU/mL (ref 0.30–0.70)
Heparin Unfractionated: 0.46 IU/mL (ref 0.30–0.70)

## 2019-08-30 LAB — APTT
aPTT: 108 seconds — ABNORMAL HIGH (ref 24–36)
aPTT: 123 seconds — ABNORMAL HIGH (ref 24–36)

## 2019-08-30 MED ORDER — POTASSIUM CHLORIDE 20 MEQ/15ML (10%) PO SOLN
40.0000 meq | Freq: Once | ORAL | Status: AC
  Administered 2019-08-30: 40 meq
  Filled 2019-08-30: qty 30

## 2019-08-30 MED ORDER — POTASSIUM CHLORIDE 20 MEQ/15ML (10%) PO SOLN
40.0000 meq | ORAL | Status: AC
  Administered 2019-08-30 – 2019-08-31 (×2): 40 meq
  Filled 2019-08-30 (×2): qty 30

## 2019-08-30 MED ORDER — EPINEPHRINE 1 MG/10ML IJ SOSY
PREFILLED_SYRINGE | INTRAMUSCULAR | Status: AC
  Filled 2019-08-30: qty 20

## 2019-08-30 MED ORDER — METOCLOPRAMIDE HCL 5 MG/ML IJ SOLN
INTRAMUSCULAR | Status: AC
  Administered 2019-08-30: 14:00:00 10 mg
  Filled 2019-08-30: qty 2

## 2019-08-30 MED ORDER — POTASSIUM CHLORIDE 20 MEQ/15ML (10%) PO SOLN
40.0000 meq | Freq: Once | ORAL | Status: AC
  Administered 2019-08-30: 40 meq via ORAL
  Filled 2019-08-30: qty 30

## 2019-08-30 MED ORDER — EPINEPHRINE 1 MG/10ML IJ SOSY
PREFILLED_SYRINGE | INTRAMUSCULAR | Status: AC
  Filled 2019-08-30: qty 10

## 2019-08-30 MED ORDER — FUROSEMIDE 10 MG/ML IJ SOLN
3.0000 mg/h | INTRAVENOUS | Status: DC
  Administered 2019-08-30: 3 mg/h via INTRAVENOUS
  Filled 2019-08-30: qty 25

## 2019-08-30 MED ORDER — NOREPINEPHRINE 16 MG/250ML-% IV SOLN
0.0000 ug/min | INTRAVENOUS | Status: DC
  Administered 2019-08-30: 23:00:00 12 ug/min via INTRAVENOUS
  Administered 2019-08-31: 21:00:00 6 ug/min via INTRAVENOUS
  Administered 2019-09-07 – 2019-09-09 (×2): 4 ug/min via INTRAVENOUS
  Administered 2019-09-10 (×2): 10 ug/min via INTRAVENOUS
  Administered 2019-09-11: 21:00:00 5 ug/min via INTRAVENOUS
  Administered 2019-09-13: 4 ug/min via INTRAVENOUS
  Administered 2019-09-16: 3 ug/min via INTRAVENOUS
  Filled 2019-08-30 (×12): qty 250

## 2019-08-30 NOTE — Progress Notes (Signed)
Per Agarwala MD, give 40 mEq KCl down OGT and hold off on IVPB KCl d/t fluid volume for K of 3.3 on AM labs.

## 2019-08-30 NOTE — Plan of Care (Signed)
  Problem: Nutrition: Goal: Adequate nutrition will be maintained Outcome: Not Progressing Note: Not tolerating tube feeds   Problem: Clinical Measurements: Goal: Respiratory complications will improve Outcome: Not Progressing

## 2019-08-30 NOTE — Progress Notes (Signed)
Patient ID: [name], male   DOB: April 07, 1976, 44 y.o.   MRN: [id number]    Advanced Heart Failure Rounding Note   Subjective:    Circuit changed and trach exchanged on 1/8.  Underwent addition of a RIJ drainage cannula on 1/20  Remains on VV ecmo with good flows.   On lasix gtt at 11 with excellent output -4.2L (total output 5.9L)  Remains sedated. On vent through trach. Oxygenation better with sats in the low to mid 90s.  ABG 7.55/37/55/93%  Co-ox 71%  Lactate 1.2  CXR: Diffuse bilateral lung opacities c/w ARDS. Stable overnight Personally reviewed  On vent 100% PEEP 15. PlatP 30, Bi-vent setting.TVs remain 70-80cc Personally reviewed  hgb 9.9 LDH 349-> 465 -> 547  ECMO parameters Flow 5.6 RPM 3980 dP 42 Pressure -95   Objective:   Weight Range:  Vital Signs:   Temp:  [97.5 F (36.4 C)-97.7 F (36.5 C)] 97.5 F (36.4 C) (01/21 1600) Pulse Rate:  [79-107] 89 (01/22 0700) Resp:  [0-23] 13 (01/22 0700) SpO2:  [86 %-98 %] 91 % (01/22 0700) Arterial Line BP: (94-164)/(43-69) 108/51 (01/22 0700) FiO2 (%):  [100 %] 100 % (01/22 0347) Weight:  [145.3 kg] 145.3 kg (01/22 0700) Last BM Date: 08/29/19  Weight change: Filed Weights   08/28/19 0500 08/29/19 0500 08/30/19 0700  Weight: (!) 156.4 kg (!) 149 kg (!) 145.3 kg    Intake/Output:   Intake/Output Summary (Last 24 hours) at 08/30/2019 0816 Last data filed at 08/30/2019 0700 Gross per 24 hour  Intake 5365.19 ml  Output 12170 ml  Net -6804.81 ml     Physical Exam: General: Sedated on vent. HEENT: normal + nasal packing Neck: supple. RIJ cannula ok . Carotids 2+ bilat; no bruits. No lymphadenopathy or thryomegaly appreciated. Cor: PMI nondisplaced. Regular rate & rhythm. No rubs, gallops or murmurs. Lungs: minimal air movement Abdomen: obese soft, nontender, nondistended. No hepatosplenomegaly. No bruits or masses. Good bowel sounds. Extremities: no cyanosis, clubbing, rash, 2+ edema femoral  cannulas Neuro: sedated    Telemetry: Sinus 80s Personally reviewed  Labs: Basic Metabolic Panel: Recent Labs  Lab 08/24/19 0352 08/24/19 0401 08/25/19 0445 08/25/19 0452 08/28/19 0406 08/28/19 0418 08/28/19 1649 08/28/19 1651 08/29/19 0407 08/29/19 0729 08/29/19 0810 08/29/19 1600 08/30/19 0427 08/30/19 0500 08/30/19 0609  NA 144   < > 148*   < > 142   < > 146*   < > 145   < > 147* 143  146* 143 144 144  K 3.9   < > 4.1   < > 3.7   < > 3.6   < > 3.1*   < > 3.1* 3.3*  3.2* 3.3* 3.7 3.3*  CL 109   < > 113*   < > 108  --  109  --  107  --   --  103 101  --   --   CO2 24   < > 25   < > 23  --  24  --  23  --   --  26 28  --   --   GLUCOSE 176*   < > 155*   < > 187*  --  114*  --  128*  --   --  142* 123*  --   --   BUN 38*   < > 39*   < > 39*  --  40*  --  34*  --   --  30* 30*  --   --  CREATININE 0.35*   < > 0.43*   < > 0.63  --  0.62  --  0.60*  --   --  0.64 0.67  --   --   CALCIUM 9.0   < > 9.3   < > 8.9   < > 9.1   < > 9.1  --   --  9.0 8.9  --   --   MG 1.8  --  2.0  --   --   --   --   --   --   --   --   --   --   --   --    < > = values in this interval not displayed.    Liver Function Tests: Recent Labs  Lab 08/24/19 0352 08/26/19 0320  AST 19 43*  ALT 27 65*  ALKPHOS 54 68  BILITOT 0.8 1.0  PROT 5.9* 6.3*  ALBUMIN 2.6* 3.4*   No results for input(s): LIPASE, AMYLASE in the last 168 hours. No results for input(s): AMMONIA in the last 168 hours.  CBC: Recent Labs  Lab 08/26/19 0306 08/26/19 0458 08/27/19 0448 08/27/19 0456 08/28/19 0406 08/28/19 0418 08/28/19 1649 08/28/19 1651 08/29/19 0355 08/29/19 0403 08/29/19 0810 08/29/19 1600 08/30/19 0427 08/30/19 0500 08/30/19 0609  WBC 5.2   < > 6.4   < > 8.5  --  10.0  --  12.5*  --   --  11.2* 11.4*  --   --   NEUTROABS 2.1  --  2.4  --  3.6  --   --   --  5.5  --   --   --  5.5  --   --   HGB 8.1*   < > 8.4*   < > 8.6*   < > 8.8*   < > 9.3*   < > 10.2* 9.2*  9.9* 9.9* 9.9* 9.2*  HCT  27.5*   < > 27.5*   < > 28.7*   < > 29.2*   < > 31.0*   < > 30.0* 29.5*  29.0* 31.8* 29.0* 27.0*  MCV 97.5   < > 95.8   < > 95.7  --  95.1  --  93.7  --   --  91.3 92.4  --   --   PLT 92*   < > 92*   < > 101*  --  95*  --  108*  --   --  108* 109*  --   --    < > = values in this interval not displayed.    Cardiac Enzymes: No results for input(s): CKTOTAL, CKMB, CKMBINDEX, TROPONINI in the last 168 hours.  BNP: BNP (last 3 results) Recent Labs    07/22/19 1039  BNP 15.3    ProBNP (last 3 results) No results for input(s): PROBNP in the last 8760 hours.    Other results:  Imaging: DG Chest 1 View  Result Date: 08/28/2019 CLINICAL DATA:  Portable chest in OR.  Cannulation for ECMO. EXAM: CHEST  1 VIEW COMPARISON:  08/28/2019 FINDINGS: Endotracheal tube has tip approximately 4.2 cm above the carina. Right IJ large-bore central venous catheter with tip over the SVC. Left subclavian central venous catheter is obliquely oriented at the junction of the left breast valve vein to SVC. Enteric tube courses into the region of the stomach and off the film as tip is not visualized. Large bore catheter is seen over the upper abdomen with tip in  the expected region of the inferior cavoatrial junction. Bilateral chest tubes in place and unchanged. Right-sided PICC line is present as tip is not accurately visualized. Lungs demonstrate persistent severe bilateral opacification with air bronchograms without significant change. Remainder the exam is unchanged. IMPRESSION: 1. Severe bilateral airspace opacification with air bronchograms without significant change. 2.  Multiple tubes and lines as described. Electronically Signed   By: Marin Olp M.D.   On: 08/28/2019 16:07   DG Abd 1 View  Result Date: 08/29/2019 CLINICAL DATA:  Bedside gastric tube placement. EXAM: Portable ABDOMEN-1 VIEW 12:50 p.m.: COMPARISON:  Portable abdomen x-ray earlier same day at 10:14 a.m. and previously. FINDINGS: Since the  examination earlier same day, the feeding tube has been removed. Nasogastric tube tip in the distal esophagus at the EG junction and should be advanced several centimeters. Hemodialysis device in the IVC as noted previously. Visualized upper abdominal bowel gas pattern unremarkable. Airspace consolidation with air bronchograms is identified in the visualized LEFT LOWER LOBE. IMPRESSION: 1. Nasogastric tube tip in the distal esophagus at the EG junction and the tube should be advanced several centimeters. 2. LEFT LOWER LOBE pneumonia. 3. No acute abdominal abnormality. These results will be called to the ordering clinician or representative by the Radiologist Assistant, and communication documented in the PACS or zVision Dashboard. Electronically Signed   By: Evangeline Dakin M.D.   On: 08/29/2019 13:01   DG Abd 1 View  Result Date: 08/29/2019 CLINICAL DATA:  Feeding tube placement EXAM: ABDOMEN - 1 VIEW COMPARISON:  08/28/2019 FINDINGS: Enteric tube is unchanged in position with tip overlying the distal stomach. Femoral approach central venous catheter is again identified. Paucity of bowel gas again noted. IMPRESSION: Enteric tube tip overlies distal stomach. Electronically Signed   By: Macy Mis M.D.   On: 08/29/2019 10:34   DG Chest Port 1 View  Result Date: 08/30/2019 CLINICAL DATA:  Acute respiratory failure. Hypoxia. ECMO. EXAM: PORTABLE CHEST 1 VIEW COMPARISON:  Radiograph yesterday. FINDINGS: Tracheostomy tube tip remains at the thoracic inlet. Enteric tube tip and side-port below the diaphragm in the stomach. Bilateral chest tubes in place. Right internal jugular ECMO catheter tip the right atrium. Inferior approach ECMO catheter at the IVC atrial caval junction. Left subclavian central line remains in place. Right upper extremity PICC remains in place. Improved right lung base aeration from prior exam. Heterogeneous bilateral lung opacities are otherwise unchanged with central air bronchograms  on the left. No visualized pneumothorax. IMPRESSION: Improved right lung base aeration. Otherwise unchanged appearance of the chest with diffuse bilateral lung opacities. Stable support apparatus. Electronically Signed   By: Keith Rake M.D.   On: 08/30/2019 06:17   DG Chest Port 1 View  Result Date: 08/29/2019 CLINICAL DATA:  ECMO. EXAM: PORTABLE CHEST 1 VIEW COMPARISON:  08/28/2019. FINDINGS: Tracheostomy tube, feeding tube, large bore right IJ and IVC catheters, left subclavian central line, right PICC line, and bilateral chest tubes in stable position. No pneumothorax. Stable cardiomegaly. Low lung volumes. Diffuse severe bilateral pulmonary infiltrates again noted without interim change. No pleural effusion. IMPRESSION: 1. Lines and tubes including bilateral chest tubes in stable position. No pneumothorax. 2. Lung volumes. Diffuse severe bilateral pulmonary infiltrates again noted without interim change. 3.  Stable cardiomegaly. Electronically Signed   By: Marcello Moores  Register   On: 08/29/2019 06:26   DG CHEST PORT 1 VIEW  Result Date: 08/28/2019 CLINICAL DATA:  Feeding tube dysfunction EXAM: PORTABLE CHEST 1 VIEW COMPARISON:  Portable exam 1804 hours  compared to 1546 hours FINDINGS: Tip of tracheostomy tube projects 4.5 cm above carina. Feeding tube extends into abdomen. Large-bore central catheters from subdiaphragmatic and RIGHT jugular approaches appear unchanged. BILATERAL thoracostomy tubes unchanged. LEFT subclavian central venous catheter with tip projecting over SVC. Extensive BILATERAL airspace consolidation with air bronchograms, minimally improved. No pneumothorax. IMPRESSION: Persistent severe diffuse BILATERAL airspace infiltrates, minimally improved. Electronically Signed   By: Lavonia Dana M.D.   On: 08/28/2019 20:05   DG Abd Portable 1V  Result Date: 08/28/2019 CLINICAL DATA:  Feeding tube dysfunction EXAM: PORTABLE ABDOMEN - 1 VIEW COMPARISON:  Portable exam 1806 hours FINDINGS:  Tip of feeding tube projects over distal gastric antrum. Large-bore central venous catheters via femoral approach is, tip of 1 at T12, second above exam. Paucity of bowel gas. No osseous findings. IMPRESSION: Tip of feeding tube projects over distal gastric antrum. Electronically Signed   By: Lavonia Dana M.D.   On: 08/28/2019 20:05   HYBRID OR IMAGING ([facility] ONLY)  Result Date: 08/28/2019 There is no interpretation for this exam.  This order is for images obtained during a surgical procedure.  Please See "Surgeries" Tab for more information regarding the procedure.     Medications:     Scheduled Medications: . artificial tears  1 application Both Eyes H6K  . vitamin C  500 mg Per Tube Daily  . chlorhexidine gluconate (MEDLINE KIT)  15 mL Mouth Rinse BID  . Chlorhexidine Gluconate Cloth  6 each Topical Daily  . diazepam  20 mg Per Tube Q6H  . feeding supplement (PRO-STAT SUGAR FREE 64)  60 mL Per Tube BID  . free water  300 mL Per Tube Q8H  . gabapentin  450 mg Per Tube Q8H  . HYDROmorphone  8 mg Per Tube Q4H  . insulin aspart  3-9 Units Subcutaneous Q4H  . ipratropium-albuterol  3 mL Nebulization Q4H  . mouth rinse  15 mL Mouth Rinse 10 times per day  . montelukast  10 mg Per Tube QHS  . oxymetazoline  1 spray Each Nare BID  . pantoprazole sodium  40 mg Per Tube QHS  . QUEtiapine  200 mg Per Tube BID  . sodium chloride flush  10-40 mL Intracatheter Q12H  . sodium chloride flush  10-40 mL Intracatheter Q12H  . Thrombi-Pad  1 each Topical Once  . zinc sulfate  220 mg Per Tube Daily    Infusions: . sodium chloride    . sodium chloride 10 mL/hr at 08/29/19 1714  . sodium chloride 10 mL/hr at 08/28/19 1209  . albumin human Stopped (08/28/19 0537)  . cefTRIAXone (ROCEPHIN)  IV Stopped (08/29/19 1124)  . cisatracurium (NIMBEX) infusion 5 mcg/kg/min (08/30/19 0700)  . dexmedetomidine (PRECEDEX) IV infusion 1.6 mcg/kg/hr (08/30/19 0700)  . dextrose    . feeding supplement (PIVOT 1.5  CAL) Stopped (08/29/19 1515)  . furosemide (LASIX) infusion 11 mg/hr (08/30/19 0700)  . heparin 2,750 Units/hr (08/30/19 0700)  . HYDROmorphone 20 mg/hr (08/30/19 0708)  . midazolam (VERSED) infusion 38 mg/hr (08/30/19 0700)  . norepinephrine (LEVOPHED) Adult infusion 4 mcg/min (08/30/19 0700)    PRN Medications: Place/Maintain arterial line **AND** sodium chloride, sodium chloride, sodium chloride, acetaminophen (TYLENOL) oral liquid 160 mg/5 mL, acetaminophen, albumin human, dextrose, hydrALAZINE, HYDROmorphone, magnesium hydroxide, metoprolol tartrate, midazolam, ondansetron (ZOFRAN) IV, senna-docusate, sodium chloride, sodium chloride flush, sodium chloride flush, sodium chloride flush   Assessment/Plan:    1. Acute hypoxic/hypercarbic respiratory failure due to COVID PNA: Remained markedly hypoxic despite full  support. Intubated 12/26. S/p bilateral CTs 12/26 for pneumothoraces.  TEE on 12/26 LVEF 70% RV ok. VV ECMO begun 12/26. Tracheostomy 12/29.  COVID ARDS at this point.  Flows stable and sats improved with deeper sedation and re-paralysis. ECMO circuit changed 1/8 - ECMO parameters stable.  - RIJ venous drainage cannula added 1/20 - Flows look good.  - Continues with excellent diuresis on IV lasix. Tolerating well. Will continue. Supp K - CXR and sats improving with diuresis and additional drainage cannula. Continue to follow - Main issue is bad lungs. Continue maneuvers to improve lung compliance - chest PT, diuresis, standing position. Bronch as needed - Continue to wait on lung recovery - LDH 349 -> 465 -> 547. I suspect may be related to Hoffman clamp but discussed with ECMO team who feel mainly related to aging of circuit. Platelets ok. Will follow. Likely need circuit change in next few days.    2. Bilateral PTX: Due to barotrauma.  - Driving pressure minimized at < 15 Stable. No change  3. COVID PNA: management of COVID infection per CCM. CXR with bilateral multifocal  PNA progressing to likely ARDS.  - s/p 10 days of remdesivir - 12/16, 12/2 s/p tociluzimab - 12/17 s/p convalescent plasma - Decadron at 51m/daily completed 1/11 - Completed cefepime/vanc - On ceftriaxone for nasal packing  4. ID: Empiric coverage with vancomycin/cefepime initially for possible secondary bacterial PNA, course completed.   - treated with cefepime and vanc for + cx with staph epidimidis, MSSA and serratia (sensitivities ok). - Cover with ceftriaxone for nasal packing - Remains stable - D/w PharmD  5. Thrombocytopenia: Slow decrease in platelets, likely low level hemolysis + critical illness.  - Stable 109k this am.  - On hepain. No bleeding. Discussed dosing with PharmD personally.  6. Anemia: Bleeding at trach site resolved but still with epistaxis - hgb 9.2   today - Transfuse hgb < 8  7. FEN - continue TFs  D/w with ECMO team (ECMO coordinator/specialists, CCM, TCTS and pharmD) at bedside on multidisciplinary rounds. Continue to await ARDS recovery. Details as above.   Length of Stay: 39   CRITICAL CARE Performed by: BGlori Bickers Total critical care time: 35 minutes  Critical care time was exclusive of separately billable procedures and treating other patients.  Critical care was necessary to treat or prevent imminent or life-threatening deterioration.  Critical care was time spent personally by me (independent of midlevel providers or residents) on the following activities: development of treatment plan with patient and/or surrogate as well as nursing, discussions with consultants, evaluation of patient's response to treatment, examination of patient, obtaining history from patient or surrogate, ordering and performing treatments and interventions, ordering and review of laboratory studies, ordering and review of radiographic studies, pulse oximetry and re-evaluation of patient's condition.    DGlori BickersMD 08/30/2019, 8:16 AM  Advanced Heart  Failure Team Pager [phone](M-F; 7a - 4p)  Please contact [facility] for night-coverage after hours (4p -7a ) and weekends on [email or web address]

## 2019-08-30 NOTE — Progress Notes (Addendum)
Based on patient labs and ECMO circuit numbers the decisions was made by the care team to preform a circuit change.  A sterile field was created a time out was completed and ventilator settings were optimized. Dr. Lynetta Mare and Dr. Haroldine Laws at bedside along with pharmacy, perfusion, RT, and bedside nurse.  2000 IU heparin bolus given at 1522.  ECMO off and clamped out at 1525. New circuit was connected and initiated at 1526.  Patient remained stable throughout the procedure. Cardiohelp #1; Oxy [id number]; Tubing [id number].

## 2019-08-30 NOTE — Progress Notes (Signed)
Agarwala MD notified of HR 130s, atrial bigeminy and BP dropping with it. Levophed titrated up and ordered to give metoprolol IV if needed.

## 2019-08-30 NOTE — Progress Notes (Signed)
RT NOTE: RT at bedside for ECMO cannula change. Emergency vent settings placed with confirmation by Dr.Agarwala. No complications. RT will continue to monitor.

## 2019-08-30 NOTE — Progress Notes (Signed)
Agarwala MD notified of K 3.8. Ordered to give 40 mEq down OGT rather than IVPB runs.

## 2019-08-30 NOTE — Progress Notes (Signed)
RT NOTE: Holding CPT at this time due to HR 130's. RT will continue to monitor.

## 2019-08-30 NOTE — Progress Notes (Signed)
ANTICOAGULATION CONSULT NOTE - Follow Up Consult  Pharmacy Consult for heparin Indication: ECMO  Patient Measurements: Height: 5' 8"  (172.7 cm) Weight: (!) 328 lb 7.8 oz (149 kg) IBW/kg (Calculated) : 68.4 Heparin Dosing Weight: 104.5kg  Vital Signs: Temp: 97.5 F (36.4 C) (01/21 1600) Temp Source: Axillary (01/21 1600) Pulse Rate: 87 (01/21 1700)  Labs: Recent Labs    08/28/19 0406 08/28/19 0418 08/28/19 1649 08/28/19 1651 08/29/19 0355 08/29/19 0403 08/29/19 0407 08/29/19 0729 08/29/19 0810 08/29/19 1200 08/29/19 1600 08/29/19 1600 08/30/19 0427 08/30/19 0427 08/30/19 0500 08/30/19 0609  HGB 8.6*   < > 8.8*   < > 9.3*   < >  --    < >   < >  --  9.2*  9.9*   < > 9.9*   < > 9.9* 9.2*  HCT 28.7*   < > 29.2*   < > 31.0*   < >  --    < >   < >  --  29.5*  29.0*   < > 31.8*  --  29.0* 27.0*  PLT 101*   < > 95*   < > 108*  --   --   --   --   --  108*  --  109*  --   --   --   APTT 102*   < > 59*  --  89*  --   --   --   --   --  80*  --   --   --   --   --   LABPROT 17.0*  --   --   --   --   --   --   --   --   --   --   --   --   --   --   --   INR 1.4*  --   --   --   --   --   --   --   --   --   --   --   --   --   --   --   HEPARINUNFRC 0.30   < > 0.13*   < > 0.24*  --   --    < >  --  0.36 0.31  --  0.37  --   --   --   CREATININE 0.63   < > 0.62  --   --   --  0.60*  --   --   --  0.64  --   --   --   --   --    < > = values in this interval not displayed.    Assessment: 44yo male on VV ECMO on heparin infusion.   Therapeutic on heparin this morning. No infusion issues noted, small nose bleed on/off with packing changed, otherwise no bleeding per RN. No changes this morning. CBC stable.   Goal of Therapy:  Heparin level 0.3-0.5 units/ml   Plan:  Continue heparin gtt at 2750 units/hr  Monitor daily HL, CBC, plt Monitor for signs/symptoms of bleeding   Erin Hearing PharmD., BCPS Clinical Pharmacist 08/30/2019 7:21 AM  Please check AMION for all [place] phone numbers After 10:00 PM, call [place] [phone]

## 2019-08-30 NOTE — Progress Notes (Signed)
ANTICOAGULATION CONSULT NOTE - Follow Up Consult  Pharmacy Consult for heparin Indication: ECMO  Patient Measurements: Height: 5' 8"  (172.7 cm) Weight: (!) 328 lb 7.8 oz (149 kg) IBW/kg (Calculated) : 68.4 Heparin Dosing Weight: 104.5kg  Vital Signs: Temp: 97.5 F (36.4 C) (01/21 1600) Temp Source: Axillary (01/21 1600) Pulse Rate: 87 (01/21 1700)  Labs: Recent Labs    08/28/19 0406 08/28/19 0418 08/29/19 1600 08/29/19 1600 08/30/19 0427 08/30/19 0500 08/30/19 1137 08/30/19 1137 08/30/19 1310 08/30/19 1558 08/30/19 1600 08/30/19 1613 08/30/19 1958  HGB 8.6*   < > 9.2*  9.9*   < > 9.9*   < > 11.2*   < >  --  9.2*  --  9.9*  --   HCT 28.7*   < > 29.5*  29.0*   < > 31.8*   < > 33.0*  --   --  29.5*  --  29.0*  --   PLT 101*   < > 108*  --  109*  --   --   --   --  111*  --   --   --   APTT 102*   < > 80*  --  108*  --   --   --   --   --  123*  --   --   LABPROT 17.0*  --   --   --   --   --   --   --   --   --   --   --   --   INR 1.4*  --   --   --   --   --   --   --   --   --   --   --   --   HEPARINUNFRC 0.30   < > 0.31  --  0.37  --   --   --   --   --   --   --  0.46  CREATININE 0.63   < > 0.64   < > 0.67  --   --   --  0.71  --   --   --  0.73   < > = values in this interval not displayed.    Assessment: 44yo male on VV ECMO on heparin infusion.   Circuit changed 1/22 PM, heparin 2000 units bolused at 15:22. Therapeutic on heparin. HL at 20:00 therapeutic and slightly higher than previous levels as expected from bolus. Mild epistaxis continues, packing changed.    Goal of Therapy:  Heparin level 0.3-0.5 units/ml   Plan:  Continue heparin gtt at 2750 units/hr  Monitor daily HL, CBC, plt Monitor for signs/symptoms of bleeding    Benetta Spar, PharmD, BCPS, BCCP Clinical Pharmacist  Please check AMION for all [place] phone numbers After 10:00 PM, call [place] [phone]

## 2019-08-30 NOTE — Procedures (Signed)
Cortrak  Person Inserting Tube:  [name], RD Tube Type:  Cortrak - 43 inches Initial Placement:  Postpyloric Technique Used to Measure Tube Placement:  Documented cm marking at nare/ corner of mouth Cortrak Secured At:  92 cm Procedure Comments:  Cortrak Tube Team Note:  Consult received to place a Cortrak feeding tube.  Dr. Lynetta Mare placed Cortrak tube orally with the use of glidescope; RD assisted during tube placement.  No x-ray is required. RN may begin using tube.    If the tube becomes dislodged please keep the tube and contact the Cortrak team at [email or web address] ([email or web address]) for replacement.  If after hours and replacement cannot be delayed, place a NG tube and confirm placement with an abdominal x-ray.     [name] Matin, MS, RD, LDN, [facility] Inpatient Clinical Dietitian Pager # [phone] After hours/weekend pager # [phone]

## 2019-08-30 NOTE — Progress Notes (Signed)
Paged Agarwala MD about lowered CVP and BP and increased pressor needs. Ordered to stop lasix gtt for 4 hours (until 2130) and then restart it at 3 mg/hr.

## 2019-08-30 NOTE — Progress Notes (Signed)
Notified RRT and Agarwala MD of continued cuff leak as well as now decreasing tidal volumes. Trach changed emergently at bedside.

## 2019-08-30 NOTE — Progress Notes (Addendum)
NAME:  [name], MRN:  [id number], DOB:  1975-08-13, LOS: 50 ADMISSION DATE:  07/22/2019, CONSULTATION DATE:  12/24 REFERRING MD:  Sloan Leiter, CHIEF COMPLAINT:  Dyspnea   Brief History   44 y/o M admitted 12/14 with COVID pneumonia causing acute hypoxemic respiratory failure. Developed pneumomediastinum 12/23 with concern for possible bilateral small pneumothoraces on 12/24.  PCCM consulted for evaluation of pneumothoraces  Past Medical History  GERD HTN Asthma  Significant Hospital Events   12/14 admit with hypoxemic respiratory failure in setting of COVID-19 pneumonia 12/23 noted to have pneumomediastinum on chest x-ray 12/24 PCCM consulted for evaluation 12/26 worsening hypoxemia, intubated  12/27 VV fem/fem ECMO cannulation See bedside nursing event notes for full rundown of procedures after 12/27   Consults:  PCCM, CTS, CHF, Palliative  Procedures:  See bedside nursing event notes for full rundown of procedures.  Significant Diagnostic Tests:  CT chest 12/22 >> extensive pneumomediastinum, multifocal patchy bilateral groundglass opacities  Micro Data:  BCx2 12/14 >> negative  Sputum 12/15 >> negative  1/9 bronch: serratia and MSSA 1/2 acid fast smear bronch: negative 1/2 fungal cx bronch: pending 1/12 BAL: serratia 1/18 BAL >>  Antimicrobials:  Received remdesivir, steroids, actemra and convalescent plasma  5 days cefepime 12/24-12/29 Cefepime 1/4->1/14 vanc 1/4->1/14 Ceftriaxone 1/14 >>   Interim history/subjective:  Core track pulled yesterday.  Decreased epistaxis overnight but did require repacking this morning.  Tolerating ECMO with consistent flows.  Has been able to come down on paralytic.  Objective   Blood pressure (!) 112/47, pulse 89, temperature (!) 97.5 F (36.4 C), temperature source Axillary, resp. rate 13, height _0  (1.727 m), weight (!) 145.3 kg, SpO2 91 %. CVP:  [10 mmHg-90 mmHg] 10 mmHg  Vent Mode: Bi-Vent FiO2 (%):  [100 %] 100  % Set Rate:  [15 bmp] 15 bmp PEEP:  [15 cmH20] 15 cmH20   Intake/Output Summary (Last 24 hours) at 08/30/2019 0800 Last data filed at 08/30/2019 0700 Gross per 24 hour  Intake 5365.19 ml  Output 12170 ml  Net -6804.81 ml   Filed Weights   08/28/19 0500 08/29/19 0500 08/30/19 0700  Weight: (!) 156.4 kg (!) 149 kg (!) 145.3 kg   Examination GEN: obese ill man on vent HEENT: trach in place, small thick mucoid secretions,removed nasal packing  now more clear serous drainage from nose CV: RRR, ext warm PULM: poor air movement, passive on vent.  Bronchial breath sounds anteriorly GI: Soft, + BS EXT: Global anascarca NEURO: sedated, incomplete paralysis, still coughs with suctioning PSYCH: RASS -4 SKIN: pale Femoral cannulas in place, sites looks okay Bilateral chest tubes in place, no tidaling or air leak, ongoing serosanguinous output Foley in place dark yellow urine Rectal tube in place with tan loose stool   CXR improving oxygenation of upper lung fields with dense consolidation at bases bilaterally.  Now better able to see the diaphragms.   Flow (LPM): 5.50 ECMO Mode: VV ECMO Device: Cardiohelp   Vent Mode: Bi-Vent (high p 30), Set Rate: 15 bmp, FiO2 (%): 100 %, PEEP: 15 cmH20  Net IO Since Admission: 34,066.84 mL [08/30/19 [facility] Problem list   MSSA and serratia marcescens pneumonia  Assessment & Plan:    Acute hypoxemic respiratory failure with ARDS secondary to COVID-19, s/p tracheostomy requiring mechanical ventilation and VV ECMO support.  COVID has been treated with all evidence based therapies, we are dealing with lingering severe diffuse alveolar damage.  -Continue ECMO at current flow rate.  -  Bronchoscopy for secretion/clot clearance as needed -Continue to follow Vt on Bivent. Keep Driving pressure 15 of less. -Heparin with 0.3-0.5 goal  -Tolerating aggressive diuresis with apparent improvement in oxygenation. - Come off paralytic today  followed by NO wean. - Slow weaning of iv sedation thereafter.  Bilateral pneumothoraces and pneumomediastinum No leak today Continue suction  -20cmH2O  Posterior epistaxis.  Repacked multiple times  Cortrack removed and epistaxis appears to have resolved. Left nostril repacked  Daily Goals Checklist  Pain/Anxiety/Delirium protocol (if indicated): Continue current oral regimen. Wean versed today. Propofol to be used if needed to control periodic agitation.  VAP protocol (if indicated): Bundle in place. Respiratory support goals:  Continue PCV - will attempt recruitment and PEEP optimization following bronchoscopy Continue to oversweep as may help with air hunger during sedation wean. Blood pressure target:  MAP>65, Titrate NE as needed. DVT prophylaxis: on systemic anticoagulation on VV-ECMO -  Nutrition Status: Nutrition Problem: Increased nutrient needs Etiology: acute illness(COVID PNA) Signs/Symptoms: estimated needs Interventions: Tube feeding, Prostat GI prophylaxis: Protonix per tube. Fluid status goals: Continue aggressive diuresis as tolerated aim for -1L per day. Urinary catheter: Guide hemodynamic management Glucose control: euglycemic on SSI. Mobility/therapy needs: Bedrest.  Daily labs: as per ECMO protocol. Code Status: Full code  Family Communication: will reupdate wife today. Disposition: ICU   Labs   CBC: Recent Labs  Lab 08/26/19 0306 08/26/19 0458 08/27/19 0448 08/27/19 0456 08/28/19 0406 08/28/19 0418 08/28/19 1649 08/28/19 1651 08/29/19 0355 08/29/19 0403 08/29/19 0810 08/29/19 1600 08/30/19 0427 08/30/19 0500 08/30/19 0609  WBC 5.2   < > 6.4   < > 8.5  --  10.0  --  12.5*  --   --  11.2* 11.4*  --   --   NEUTROABS 2.1  --  2.4  --  3.6  --   --   --  5.5  --   --   --  5.5  --   --   HGB 8.1*   < > 8.4*   < > 8.6*   < > 8.8*   < > 9.3*   < > 10.2* 9.2*  9.9* 9.9* 9.9* 9.2*  HCT 27.5*   < > 27.5*   < > 28.7*   < > 29.2*   < > 31.0*   < >  30.0* 29.5*  29.0* 31.8* 29.0* 27.0*  MCV 97.5   < > 95.8   < > 95.7  --  95.1  --  93.7  --   --  91.3 92.4  --   --   PLT 92*   < > 92*   < > 101*  --  95*  --  108*  --   --  108* 109*  --   --    < > = values in this interval not displayed.    Basic Metabolic Panel: Recent Labs  Lab 08/24/19 0352 08/24/19 0401 08/25/19 0445 08/25/19 0452 08/28/19 0406 08/28/19 0418 08/28/19 1649 08/28/19 1651 08/29/19 0407 08/29/19 0729 08/29/19 0810 08/29/19 1600 08/30/19 0427 08/30/19 0500 08/30/19 0609  NA 144   < > 148*   < > 142   < > 146*   < > 145   < > 147* 143  146* 143 144 144  K 3.9   < > 4.1   < > 3.7   < > 3.6   < > 3.1*   < > 3.1* 3.3*  3.2* 3.3* 3.7 3.3*  CL 109   < >  113*   < > 108  --  109  --  107  --   --  103 101  --   --   CO2 24   < > 25   < > 23  --  24  --  23  --   --  26 28  --   --   GLUCOSE 176*   < > 155*   < > 187*  --  114*  --  128*  --   --  142* 123*  --   --   BUN 38*   < > 39*   < > 39*  --  40*  --  34*  --   --  30* 30*  --   --   CREATININE 0.35*   < > 0.43*   < > 0.63  --  0.62  --  0.60*  --   --  0.64 0.67  --   --   CALCIUM 9.0   < > 9.3   < > 8.9  --  9.1  --  9.1  --   --  9.0 8.9  --   --   MG 1.8  --  2.0  --   --   --   --   --   --   --   --   --   --   --   --    < > = values in this interval not displayed.   GFR: Estimated Creatinine Clearance: 163.6 mL/min (by C-G formula based on SCr of 0.67 mg/dL). Recent Labs  Lab 08/27/19 0448 08/27/19 1700 08/28/19 0406 08/28/19 0406 08/28/19 1649 08/29/19 0355 08/29/19 1600 08/30/19 0427  WBC 6.4   < > 8.5   < > 10.0 12.5* 11.2* 11.4*  LATICACIDVEN 1.1  --  1.2  --   --  0.9  --  1.2   < > = values in this interval not displayed.    Liver Function Tests: Recent Labs  Lab 08/24/19 0352 08/26/19 0320  AST 19 43*  ALT 27 65*  ALKPHOS 54 68  BILITOT 0.8 1.0  PROT 5.9* 6.3*  ALBUMIN 2.6* 3.4*   No results for input(s): LIPASE, AMYLASE in the last 168 hours. No results for  input(s): AMMONIA in the last 168 hours.  ABG    Component Value Date/Time   PHART 7.552 (H) 08/30/2019 0609   PCO2ART 36.7 08/30/2019 0609   PO2ART 55.0 (L) 08/30/2019 0609   HCO3 32.5 (H) 08/30/2019 0609   TCO2 34 (H) 08/30/2019 0609   ACIDBASEDEF 2.0 08/28/2019 1519   O2SAT 93.0 08/30/2019 0609     Coagulation Profile: Recent Labs  Lab 08/24/19 0352 08/28/19 0406  INR 1.3* 1.4*    Cardiac Enzymes: No results for input(s): CKTOTAL, CKMB, CKMBINDEX, TROPONINI in the last 168 hours.  HbA1C: Hgb A1c MFr Bld  Date/Time Value Ref Range Status  07/24/2019 04:50 AM 6.7 (H) 4.8 - 5.6 % Final    Comment:    (NOTE) Pre diabetes:          5.7%-6.4% Diabetes:              >6.4% Glycemic control for   <7.0% adults with diabetes   09/30/2017 05:36 AM 5.7 (H) 4.8 - 5.6 % Final    Comment:    (NOTE)         Prediabetes: 5.7 - 6.4         Diabetes: >6.4  Glycemic control for adults with diabetes: <7.0     CBG: Recent Labs  Lab 08/29/19 1142 08/29/19 1557 08/29/19 2104 08/30/19 0016 08/30/19 0500  GLUCAP 120* 137* 114* 133* 116*    CRITICAL CARE Performed by: Kipp Brood   Total critical care time: 50 minutes  Critical care time was exclusive of separately billable procedures and treating other patients.  Critical care was necessary to treat or prevent imminent or life-threatening deterioration.  Critical care was time spent personally by me on the following activities: development of treatment plan with patient and/or surrogate as well as nursing, discussions with consultants, evaluation of patient's response to treatment, examination of patient, obtaining history from patient or surrogate, ordering and performing treatments and interventions, ordering and review of laboratory studies, ordering and review of radiographic studies, pulse oximetry, re-evaluation of patient's condition and participation in multidisciplinary rounds.  Kipp Brood, MD  [facility] ICU Physician [place]  Pager: [phone] Mobile: [phone] After hours: [phone].     08/30/2019, 8:00 AM

## 2019-08-30 NOTE — Progress Notes (Signed)
Per Agarwala MD, do not use train of fours to titrate nimbex d/t not needing him totally paralyzed.

## 2019-08-31 ENCOUNTER — Encounter (HOSPITAL_COMMUNITY): Payer: Self-pay | Admitting: Anesthesiology

## 2019-08-31 ENCOUNTER — Encounter (HOSPITAL_COMMUNITY)
Admission: EM | Disposition: A | Discharge status: Rehabilitation Facility | Payer: Self-pay | Source: Home / Self Care | Attending: Cardiothoracic Surgery

## 2019-08-31 ENCOUNTER — Inpatient Hospital Stay (HOSPITAL_COMMUNITY): Payer: Managed Care, Other (non HMO)

## 2019-08-31 HISTORY — PX: NASAL ENDOSCOPY WITH EPISTAXIS CONTROL: SHX5664

## 2019-08-31 LAB — CBC
HCT: 27.6 % — ABNORMAL LOW (ref 39.0–52.0)
HCT: 29.7 % — ABNORMAL LOW (ref 39.0–52.0)
HCT: 29 % — ABNORMAL LOW (ref 39.0–52.0)
Hemoglobin: 8.5 g/dL — ABNORMAL LOW (ref 13.0–17.0)
Hemoglobin: 9 g/dL — ABNORMAL LOW (ref 13.0–17.0)
Hemoglobin: 9.2 g/dL — ABNORMAL LOW (ref 13.0–17.0)
MCH: 28.3 pg (ref 26.0–34.0)
MCH: 28.3 pg (ref 26.0–34.0)
MCH: 29.3 pg (ref 26.0–34.0)
MCHC: 30.8 g/dL (ref 30.0–36.0)
MCHC: 30.3 g/dL (ref 30.0–36.0)
MCHC: 31.7 g/dL (ref 30.0–36.0)
MCV: 92 fL (ref 80.0–100.0)
MCV: 93.4 fL (ref 80.0–100.0)
MCV: 92.4 fL (ref 80.0–100.0)
Platelets: 108 10*3/uL — ABNORMAL LOW (ref 150–400)
Platelets: 116 10*3/uL — ABNORMAL LOW (ref 150–400)
Platelets: 116 10*3/uL — ABNORMAL LOW (ref 150–400)
RBC: 3 MIL/uL — ABNORMAL LOW (ref 4.22–5.81)
RBC: 3.18 MIL/uL — ABNORMAL LOW (ref 4.22–5.81)
RBC: 3.14 MIL/uL — ABNORMAL LOW (ref 4.22–5.81)
RDW: 18.1 % — ABNORMAL HIGH (ref 11.5–15.5)
RDW: 17.6 % — ABNORMAL HIGH (ref 11.5–15.5)
RDW: 17.6 % — ABNORMAL HIGH (ref 11.5–15.5)
WBC: 9.7 10*3/uL (ref 4.0–10.5)
WBC: 8.8 10*3/uL (ref 4.0–10.5)
WBC: 8.9 10*3/uL (ref 4.0–10.5)
nRBC: 1.2 % — ABNORMAL HIGH (ref 0.0–0.2)
nRBC: 1.1 % — ABNORMAL HIGH (ref 0.0–0.2)
nRBC: 1.5 % — ABNORMAL HIGH (ref 0.0–0.2)

## 2019-08-31 LAB — POCT I-STAT 7, (LYTES, BLD GAS, ICA,H+H)
Acid-Base Excess: 7 mmol/L — ABNORMAL HIGH (ref 0.0–2.0)
Acid-Base Excess: 7 mmol/L — ABNORMAL HIGH (ref 0.0–2.0)
Acid-Base Excess: 5 mmol/L — ABNORMAL HIGH (ref 0.0–2.0)
Acid-Base Excess: 5 mmol/L — ABNORMAL HIGH (ref 0.0–2.0)
Bicarbonate: 31.5 mmol/L — ABNORMAL HIGH (ref 20.0–28.0)
Bicarbonate: 30.7 mmol/L — ABNORMAL HIGH (ref 20.0–28.0)
Bicarbonate: 28 mmol/L (ref 20.0–28.0)
Bicarbonate: 29.2 mmol/L — ABNORMAL HIGH (ref 20.0–28.0)
Calcium, Ion: 1.19 mmol/L (ref 1.15–1.40)
Calcium, Ion: 1.18 mmol/L (ref 1.15–1.40)
Calcium, Ion: 1.19 mmol/L (ref 1.15–1.40)
Calcium, Ion: 1.23 mmol/L (ref 1.15–1.40)
HCT: 33 % — ABNORMAL LOW (ref 39.0–52.0)
HCT: 25 % — ABNORMAL LOW (ref 39.0–52.0)
HCT: 27 % — ABNORMAL LOW (ref 39.0–52.0)
HCT: 25 % — ABNORMAL LOW (ref 39.0–52.0)
Hemoglobin: 11.2 g/dL — ABNORMAL LOW (ref 13.0–17.0)
Hemoglobin: 8.5 g/dL — ABNORMAL LOW (ref 13.0–17.0)
Hemoglobin: 9.2 g/dL — ABNORMAL LOW (ref 13.0–17.0)
Hemoglobin: 8.5 g/dL — ABNORMAL LOW (ref 13.0–17.0)
O2 Saturation: 93 %
O2 Saturation: 91 %
O2 Saturation: 94 %
O2 Saturation: 86 %
Patient temperature: 97.5
Patient temperature: 36.1
Patient temperature: 36.2
Patient temperature: 97.2
Potassium: 3.6 mmol/L (ref 3.5–5.1)
Potassium: 3.7 mmol/L (ref 3.5–5.1)
Potassium: 4.3 mmol/L (ref 3.5–5.1)
Potassium: 4 mmol/L (ref 3.5–5.1)
Sodium: 145 mmol/L (ref 135–145)
Sodium: 145 mmol/L (ref 135–145)
Sodium: 146 mmol/L — ABNORMAL HIGH (ref 135–145)
Sodium: 147 mmol/L — ABNORMAL HIGH (ref 135–145)
TCO2: 33 mmol/L — ABNORMAL HIGH (ref 22–32)
TCO2: 32 mmol/L (ref 22–32)
TCO2: 29 mmol/L (ref 22–32)
TCO2: 30 mmol/L (ref 22–32)
pCO2 arterial: 40.5 mmHg (ref 32.0–48.0)
pCO2 arterial: 37.4 mmHg (ref 32.0–48.0)
pCO2 arterial: 35.2 mmHg (ref 32.0–48.0)
pCO2 arterial: 39.7 mmHg (ref 32.0–48.0)
pH, Arterial: 7.497 — ABNORMAL HIGH (ref 7.350–7.450)
pH, Arterial: 7.518 — ABNORMAL HIGH (ref 7.350–7.450)
pH, Arterial: 7.507 — ABNORMAL HIGH (ref 7.350–7.450)
pH, Arterial: 7.472 — ABNORMAL HIGH (ref 7.350–7.450)
pO2, Arterial: 59 mmHg — ABNORMAL LOW (ref 83.0–108.0)
pO2, Arterial: 52 mmHg — ABNORMAL LOW (ref 83.0–108.0)
pO2, Arterial: 61 mmHg — ABNORMAL LOW (ref 83.0–108.0)
pO2, Arterial: 47 mmHg — ABNORMAL LOW (ref 83.0–108.0)

## 2019-08-31 LAB — BASIC METABOLIC PANEL
Anion gap: 12 (ref 5–15)
Anion gap: 11 (ref 5–15)
BUN: 33 mg/dL — ABNORMAL HIGH (ref 6–20)
BUN: 31 mg/dL — ABNORMAL HIGH (ref 6–20)
CO2: 29 mmol/L (ref 22–32)
CO2: 28 mmol/L (ref 22–32)
Calcium: 8.6 mg/dL — ABNORMAL LOW (ref 8.9–10.3)
Calcium: 8.8 mg/dL — ABNORMAL LOW (ref 8.9–10.3)
Chloride: 102 mmol/L (ref 98–111)
Chloride: 107 mmol/L (ref 98–111)
Creatinine, Ser: 0.74 mg/dL (ref 0.61–1.24)
Creatinine, Ser: 0.68 mg/dL (ref 0.61–1.24)
GFR calc Af Amer: >60 mL/min (ref >60)
GFR calc Af Amer: >60 mL/min (ref >60)
GFR calc non Af Amer: >60 mL/min (ref >60)
GFR calc non Af Amer: >60 mL/min (ref >60)
Glucose, Bld: 192 mg/dL — ABNORMAL HIGH (ref 70–99)
Glucose, Bld: 181 mg/dL — ABNORMAL HIGH (ref 70–99)
Potassium: 3.6 mmol/L (ref 3.5–5.1)
Potassium: 4.4 mmol/L (ref 3.5–5.1)
Sodium: 143 mmol/L (ref 135–145)
Sodium: 146 mmol/L — ABNORMAL HIGH (ref 135–145)

## 2019-08-31 LAB — COMPREHENSIVE METABOLIC PANEL
ALT: 26 U/L (ref 0–44)
AST: 24 U/L (ref 15–41)
Albumin: 2.2 g/dL — ABNORMAL LOW (ref 3.5–5.0)
Alkaline Phosphatase: 75 U/L (ref 38–126)
Anion gap: 12 (ref 5–15)
BUN: 31 mg/dL — ABNORMAL HIGH (ref 6–20)
CO2: 29 mmol/L (ref 22–32)
Calcium: 8.6 mg/dL — ABNORMAL LOW (ref 8.9–10.3)
Chloride: 104 mmol/L (ref 98–111)
Creatinine, Ser: 0.65 mg/dL (ref 0.61–1.24)
GFR calc Af Amer: >60 mL/min (ref >60)
GFR calc non Af Amer: >60 mL/min (ref >60)
Glucose, Bld: 191 mg/dL — ABNORMAL HIGH (ref 70–99)
Potassium: 3.6 mmol/L (ref 3.5–5.1)
Sodium: 145 mmol/L (ref 135–145)
Total Bilirubin: 0.7 mg/dL (ref 0.3–1.2)
Total Protein: 6.2 g/dL — ABNORMAL LOW (ref 6.5–8.1)

## 2019-08-31 LAB — MAGNESIUM
Magnesium: 1.5 mg/dL — ABNORMAL LOW (ref 1.7–2.4)
Magnesium: 2.3 mg/dL (ref 1.7–2.4)

## 2019-08-31 LAB — LACTATE DEHYDROGENASE: LDH: 480 U/L — ABNORMAL HIGH (ref 98–192)

## 2019-08-31 LAB — HEPARIN LEVEL (UNFRACTIONATED)
Heparin Unfractionated: 0.38 IU/mL (ref 0.30–0.70)
Heparin Unfractionated: 0.47 IU/mL (ref 0.30–0.70)

## 2019-08-31 LAB — GLUCOSE, CAPILLARY
Glucose-Capillary: 152 mg/dL — ABNORMAL HIGH (ref 70–99)
Glucose-Capillary: 155 mg/dL — ABNORMAL HIGH (ref 70–99)
Glucose-Capillary: 161 mg/dL — ABNORMAL HIGH (ref 70–99)
Glucose-Capillary: 163 mg/dL — ABNORMAL HIGH (ref 70–99)
Glucose-Capillary: 178 mg/dL — ABNORMAL HIGH (ref 70–99)
Glucose-Capillary: 179 mg/dL — ABNORMAL HIGH (ref 70–99)
Glucose-Capillary: 185 mg/dL — ABNORMAL HIGH (ref 70–99)

## 2019-08-31 LAB — COOXEMETRY PANEL
Carboxyhemoglobin: 3.7 % — ABNORMAL HIGH (ref 0.5–1.5)
Methemoglobin: 0.6 % (ref 0.0–1.5)
O2 Saturation: 71.7 %
Total hemoglobin: 9 g/dL — ABNORMAL LOW (ref 12.0–16.0)

## 2019-08-31 LAB — APTT
aPTT: 127 seconds — ABNORMAL HIGH (ref 24–36)
aPTT: 109 seconds — ABNORMAL HIGH (ref 24–36)

## 2019-08-31 LAB — LACTIC ACID, PLASMA: Lactic Acid, Venous: 0.9 mmol/L (ref 0.5–1.9)

## 2019-08-31 LAB — TRIGLYCERIDES: Triglycerides: 241 mg/dL — ABNORMAL HIGH (ref <150)

## 2019-08-31 LAB — PREPARE RBC (CROSSMATCH)

## 2019-08-31 SURGERY — CONTROL OF EPISTAXIS, ENDOSCOPIC
Anesthesia: General | Site: Nose

## 2019-08-31 MED ORDER — POTASSIUM CHLORIDE 20 MEQ/15ML (10%) PO SOLN
40.0000 meq | Freq: Once | ORAL | Status: AC
  Administered 2019-08-31: 05:00:00 40 meq
  Filled 2019-08-31: qty 30

## 2019-08-31 MED ORDER — POTASSIUM CHLORIDE 20 MEQ/15ML (10%) PO SOLN
40.0000 meq | Freq: Once | ORAL | Status: AC
  Administered 2019-08-31: 14:00:00 40 meq
  Filled 2019-08-31: qty 30

## 2019-08-31 MED ORDER — POTASSIUM CHLORIDE 20 MEQ/15ML (10%) PO SOLN
40.0000 meq | Freq: Once | ORAL | Status: AC
  Administered 2019-08-31: 15:00:00 40 meq via ORAL
  Filled 2019-08-31: qty 30

## 2019-08-31 MED ORDER — PROPOFOL 1000 MG/100ML IV EMUL: 5.0000 ug/kg/min | INTRAVENOUS | Status: DC

## 2019-08-31 MED ORDER — HEMOSTATIC AGENTS (NO CHARGE) OPTIME
TOPICAL | Status: DC | PRN
  Administered 2019-08-31: 1 via TOPICAL

## 2019-08-31 MED ORDER — MAGNESIUM SULFATE 4 GM/100ML IV SOLN
4.0000 g | Freq: Once | INTRAVENOUS | Status: AC
  Administered 2019-08-31: 07:00:00 4 g via INTRAVENOUS
  Filled 2019-08-31: qty 100

## 2019-08-31 MED ORDER — SODIUM CHLORIDE 0.9% IV SOLUTION: Freq: Once | INTRAVENOUS | Status: AC

## 2019-08-31 MED ORDER — OXYMETAZOLINE HCL 0.05 % NA SOLN
NASAL | Status: DC | PRN
  Administered 2019-08-31: 1

## 2019-08-31 MED ORDER — CLONIDINE HCL 0.1 MG PO TABS
0.1000 mg | ORAL_TABLET | Freq: Two times a day (BID) | ORAL | Status: DC
  Administered 2019-08-31 – 2019-09-02 (×6): 0.1 mg
  Filled 2019-08-31 (×6): qty 1

## 2019-08-31 SURGICAL SUPPLY — 27 items
AGENT HMST KT MTR STRL THRMB (HEMOSTASIS) ×1
COAGULATOR SUCT 8FR VV (MISCELLANEOUS) ×1 IMPLANT
COAGULATOR SUCT SWTCH 10FR 6 (ELECTROSURGICAL) ×1 IMPLANT
COVER MAYO STAND STRL (DRAPES) IMPLANT
COVER WAND RF STERILE (DRAPES) IMPLANT
DECANTER SPIKE VIAL GLASS SM (MISCELLANEOUS) IMPLANT
DRESSING NASAL POPE 10X1.5X2.5 (GAUZE/BANDAGES/DRESSINGS) ×1 IMPLANT
DRSG NASAL POPE 10X1.5X2.5 (GAUZE/BANDAGES/DRESSINGS)
DRSG NASOPORE 8CM (GAUZE/BANDAGES/DRESSINGS) IMPLANT
ELECT COATED BLADE 2.86 ST (ELECTRODE) ×1 IMPLANT
ELECT REM PT RETURN 9FT ADLT (ELECTROSURGICAL) ×2
ELECTRODE REM PT RTRN 9FT ADLT (ELECTROSURGICAL) IMPLANT
GAUZE 4X4 16PLY RFD (DISPOSABLE) ×1 IMPLANT
GAUZE SPONGE 2X2 8PLY STRL LF (GAUZE/BANDAGES/DRESSINGS) IMPLANT
GLOVE ECLIPSE 7.5 STRL STRAW (GLOVE) ×2 IMPLANT
GOWN STRL REUS W/ TWL LRG LVL3 (GOWN DISPOSABLE) IMPLANT
GOWN STRL REUS W/TWL LRG LVL3 (GOWN DISPOSABLE) ×4
HEMOSTAT SURGICEL 2X4 FIBR (HEMOSTASIS) ×1 IMPLANT
KIT BASIN OR (CUSTOM PROCEDURE TRAY) IMPLANT
PATTIES SURGICAL .5 X3 (DISPOSABLE) ×1 IMPLANT
PENCIL BUTTON HOLSTER BLD 10FT (ELECTRODE) IMPLANT
SOLUTION BUTLER CLEAR DIP (MISCELLANEOUS) ×1 IMPLANT
SPONGE GAUZE 2X2 STER 10/PKG (GAUZE/BANDAGES/DRESSINGS)
SURGIFLO W/THROMBIN 8M KIT (HEMOSTASIS) ×1 IMPLANT
TOWEL GREEN STERILE (TOWEL DISPOSABLE) IMPLANT
TRAY ENT MC OR (CUSTOM PROCEDURE TRAY) ×2 IMPLANT
YANKAUER SUCT BULB TIP NO VENT (SUCTIONS) ×1 IMPLANT

## 2019-08-31 NOTE — Progress Notes (Signed)
   Subjective:    Patient ID: [name], male    DOB: 01/18/1976, 44 y.o.   MRN: [id number]  HPI Remains on ECMO.  Packs were removed a couple of days ago but he had to be repacked today due to resumption of bleeding.  Feeding tube has been moved to the mouth.  Review of Systems     Objective:   Physical Exam  Remains on ECMO, trach in place, sedated Bilateral nasal packing     Assessment & Plan:  Epistaxis, anti-coagulation  I discussed his problem with CCM.  I will try to arrange a bedside nasal endoscopy for cautery.

## 2019-08-31 NOTE — Progress Notes (Signed)
NAME:  [name], MRN:  [id number], DOB:  [birth year], LOS: 23 ADMISSION DATE:  07/22/2019, CONSULTATION DATE:  12/24 REFERRING MD:  Sloan Leiter, CHIEF COMPLAINT:  Dyspnea   Brief History   44 y/o M admitted 12/14 with COVID pneumonia causing acute hypoxemic respiratory failure. Developed pneumomediastinum 12/23 with concern for possible bilateral small pneumothoraces on 12/24.  PCCM consulted for evaluation of pneumothoraces  Past Medical History  GERD HTN Asthma  Significant Hospital Events   12/14 admit with hypoxemic respiratory failure in setting of COVID-19 pneumonia 12/23 noted to have pneumomediastinum on chest x-ray 12/24 PCCM consulted for evaluation 12/26 worsening hypoxemia, intubated  12/27 VV fem/fem ECMO cannulation See bedside nursing event notes for full rundown of procedures after 12/27   Consults:  PCCM, CTS, CHF, Palliative  Procedures:  See bedside nursing event notes for full rundown of procedures.  Significant Diagnostic Tests:  CT chest 12/22 >> extensive pneumomediastinum, multifocal patchy bilateral groundglass opacities  Micro Data:  BCx2 12/14 >> negative  Sputum 12/15 >> negative  1/9 bronch: serratia and MSSA 1/2 acid fast smear bronch: negative 1/2 fungal cx bronch: pending 1/12 BAL: serratia 1/18 BAL >>  Antimicrobials:  Received remdesivir, steroids, actemra and convalescent plasma  5 days cefepime 12/24-12/29 Cefepime 1/4->1/14 vanc 1/4->1/14 Ceftriaxone 1/14 >>   Interim history/subjective:  Circuit change yesterday for increasing LDH and decreased oxygenator efficiency.   Objective   Blood pressure (!) 119/40, pulse 96, temperature (!) 97.5 F (36.4 C), temperature source Axillary, resp. rate (!) 0, height _0  (1.727 m), weight (!) 145.3 kg, SpO2 100 %. CVP:  [7 mmHg-25 mmHg] 14 mmHg  Vent Mode: PCV FiO2 (%):  [60 %-100 %] 100 % Set Rate:  [15 bmp] 15 bmp PEEP:  [15 cmH20] 15 cmH20 Plateau Pressure:  [28 cmH20-30 cmH20]  28 cmH20   Intake/Output Summary (Last 24 hours) at 08/31/2019 0758 Last data filed at 08/31/2019 0700 Gross per 24 hour  Intake 5151.88 ml  Output 6885 ml  Net -1733.12 ml   Filed Weights   08/28/19 0500 08/29/19 0500 08/30/19 0700  Weight: (!) 156.4 kg (!) 149 kg (!) 145.3 kg   Examination GEN: obese ill man on vent HEENT: trach in place, continues to have epistaxis CV: RRR, ext warm PULM: poor air movement, passive on vent.  Vesicular breathing anterior.  GI: Soft, + BS EXT: Global anascarca has improved significantly - mild statis dermatitis of LLE NEURO: sedated, NMB off. PSYCH: RASS -4 SKIN: pale Femoral cannulas in place, sites looks okay Bilateral chest tubes in place, no tidaling or air leak, ongoing serosanguinous output Foley in place dark yellow urine Rectal tube in place with tan loose stool   CXR improving oxygenation of upper lung fields with dense consolidation at bases bilaterally.  Now better able to see the diaphragms. Continued dense consolidation of left base.  Lung ultrasound shows continued dense consolidation of both lungs particularly on the left.   Flow (LPM): 4.95 ECMO Mode: VV ECMO Device: Cardiohelp   $ Ventilator Initial/Subsequent : Subsequent, Vent Mode: PCV, Set Rate: 15 bmp, FiO2 (%): 100 %, I Time: 0.9 Sec(s), PEEP: 15 cmH20  Net IO Since Admission: 32,333.72 mL [08/31/19 0758]  Resolved Hospital Problem list   MSSA and serratia marcescens pneumonia   Assessment & Plan:    Acute hypoxemic respiratory failure with ARDS secondary to COVID-19, s/p tracheostomy requiring mechanical ventilation and VV ECMO support.  COVID has been treated with all evidence based therapies, we are  dealing with lingering severe diffuse alveolar damage. Marked improvement in aeration of righ  -Continue ECMO at current flow rate.  -Bronchoscopy for secretion/clot clearance today.  -Patient back to and tolerating PCV. Keep Driving pressure 15 of  less. -Heparin with 0.3-0.5 goal  -Decrease rate of diuresis to avoid increasing pressor  - Off NMB followed by NO wean. - S day Continue suction  -20cmH2O  Posterior epistaxis.  Repacked multiple times  Cortrack removed and epistaxis appears to have resolved. Both nostrils repacked 1/23 - remove plegets 1/28.  Daily Goals Checklist  Pain/Anxiety/Delirium protocol (if indicated): Continue current oral regimen.  Progressive wean on paralytics.  Wean sedation then as tolerated. VAP protocol (if indicated): Bundle in place. Respiratory support goals: Continue Bivent with ultra-lung protective ventilation, generous PEEP.  May try challenging with higher airway pressures given obesity.  Continue to oversleep as may help with air hunger during sedation wean. Blood pressure target:  MAP>65, off pressors DVT prophylaxis: on systemic anticoagulation on VV-ECMO -  Nutrition Status: Nutrition Problem: Increased nutrient needs Etiology: acute illness(COVID PNA) Signs/Symptoms: estimated needs Interventions: Tube feeding, Prostat GI prophylaxis: Protonix per tube. Fluid status goals: Continue aggressive diuresis as tolerated aim for further 2 L/day Urinary catheter: Guide hemodynamic management Glucose control: euglycemic on SSI. Mobility/therapy needs: Bedrest.  Daily labs: as per ECMO protocol. Code Status: Full code  Family Communication: updated mother at the bedside today. Disposition: ICU   Labs   CBC: Recent Labs  Lab 08/26/19 0306 08/26/19 0458 08/27/19 0448 08/27/19 0456 08/28/19 0406 08/28/19 0418 08/29/19 0355 08/29/19 0403 08/29/19 1600 08/29/19 1600 08/30/19 0427 08/30/19 0500 08/30/19 1137 08/30/19 1558 08/30/19 1613 08/31/19 0404 08/31/19 0421  WBC 5.2   < > 6.4   < > 8.5   < > 12.5*  --  11.2*  --  11.4*  --   --  12.3*  --  9.7  --   NEUTROABS 2.1  --  2.4  --  3.6  --  5.5  --   --   --  5.5  --   --   --   --   --   --   HGB 8.1*   < > 8.4*   < > 8.6*    < > 9.3*   < > 9.2*  9.9*   < > 9.9*   < > 11.2* 9.2* 9.9* 8.5* 11.2*  HCT 27.5*   < > 27.5*   < > 28.7*   < > 31.0*   < > 29.5*  29.0*   < > 31.8*   < > 33.0* 29.5* 29.0* 27.6* 33.0*  MCV 97.5   < > 95.8   < > 95.7   < > 93.7  --  91.3  --  92.4  --   --  91.6  --  92.0  --   PLT 92*   < > 92*   < > 101*   < > 108*  --  108*  --  109*  --   --  111*  --  108*  --    < > = values in this interval not displayed.    Basic Metabolic Panel: Recent Labs  Lab 08/25/19 0445 08/25/19 0452 08/29/19 1600 08/29/19 1600 08/30/19 0427 08/30/19 0500 08/30/19 1310 08/30/19 1613 08/30/19 1958 08/31/19 0404 08/31/19 0421  NA 148*   < > 143  146*   < > 143   < > 143 143 138 143 145  K 4.1   < >  3.3*  3.2*   < > 3.3*   < > 3.8 3.6 3.2* 3.6 3.6  CL 113*   < > 103  --  101  --  100  --  99 102  --   CO2 25   < > 26  --  28  --  28  --  27 29  --   GLUCOSE 155*   < > 142*  --  123*  --  153*  --  156* 192*  --   BUN 39*   < > 30*  --  30*  --  29*  --  30* 33*  --   CREATININE 0.43*   < > 0.64  --  0.67  --  0.71  --  0.73 0.74  --   CALCIUM 9.3   < > 9.0  --  8.9  --  9.1  --  8.7* 8.6*  --   MG 2.0  --   --   --   --   --   --   --   --  1.5*  --    < > = values in this interval not displayed.   GFR: Estimated Creatinine Clearance: 163.6 mL/min (by C-G formula based on SCr of 0.74 mg/dL). Recent Labs  Lab 08/28/19 0406 08/28/19 1649 08/29/19 0355 08/29/19 0355 08/29/19 1600 08/30/19 0427 08/30/19 1558 08/31/19 0404  WBC 8.5   < > 12.5*   < > 11.2* 11.4* 12.3* 9.7  LATICACIDVEN 1.2  --  0.9  --   --  1.2  --  0.9   < > = values in this interval not displayed.    Liver Function Tests: Recent Labs  Lab 08/26/19 0320  AST 43*  ALT 65*  ALKPHOS 68  BILITOT 1.0  PROT 6.3*  ALBUMIN 3.4*   No results for input(s): LIPASE, AMYLASE in the last 168 hours. No results for input(s): AMMONIA in the last 168 hours.  ABG    Component Value Date/Time   PHART 7.497 (H) 08/31/2019 0421    PCO2ART 40.5 08/31/2019 0421   PO2ART 59.0 (L) 08/31/2019 0421   HCO3 31.5 (H) 08/31/2019 0421   TCO2 33 (H) 08/31/2019 0421   ACIDBASEDEF 2.0 08/28/2019 1519   O2SAT 93.0 08/31/2019 0421     Coagulation Profile: Recent Labs  Lab 08/28/19 0406  INR 1.4*    Cardiac Enzymes: No results for input(s): CKTOTAL, CKMB, CKMBINDEX, TROPONINI in the last 168 hours.  HbA1C: Hgb A1c MFr Bld  Date/Time Value Ref Range Status  07/24/2019 04:50 AM 6.7 (H) 4.8 - 5.6 % Final    Comment:    (NOTE) Pre diabetes:          5.7%-6.4% Diabetes:              >6.4% Glycemic control for   <7.0% adults with diabetes   09/30/2017 05:36 AM 5.7 (H) 4.8 - 5.6 % Final    Comment:    (NOTE)         Prediabetes: 5.7 - 6.4         Diabetes: >6.4         Glycemic control for adults with diabetes: <7.0     CBG: Recent Labs  Lab 08/30/19 1611 08/30/19 1950 08/30/19 2347 08/31/19 0319 08/31/19 0724  GLUCAP 141* 148* 155* 163* 179*    CRITICAL CARE Performed by: Kipp Brood   Total critical care time: 50 minutes  Critical care time was exclusive of  separately billable procedures and treating other patients.  Critical care was necessary to treat or prevent imminent or life-threatening deterioration.  Critical care was time spent personally by me on the following activities: development of treatment plan with patient and/or surrogate as well as nursing, discussions with consultants, evaluation of patient's response to treatment, examination of patient, obtaining history from patient or surrogate, ordering and performing treatments and interventions, ordering and review of laboratory studies, ordering and review of radiographic studies, pulse oximetry, re-evaluation of patient's condition and participation in multidisciplinary rounds.  Kipp Brood, MD [facility] ICU Physician [place]  Pager: [phone] Mobile: [phone] After hours: [phone].     08/31/2019,  7:58 AM

## 2019-08-31 NOTE — Progress Notes (Signed)
Patient ID: [name], male   DOB: [birth year], 44 y.o.   MRN: [id number]    Advanced Heart Failure Rounding Note   Subjective:    Circuit changed and trach exchanged on 1/8.   Underwent addition of a RIJ drainage cannula on 1/20 Circuit changed on 1/22 for rising LDH  Remains on VV ecmo with good flows.   Overnight has mucous plug with drop in sats. Resolved with chest PT. Dropped BP and lasix gtt cut back to 3. No on NE at 11   Sats and TVs improving.   ABG 7.49/41/59/93%  Co-ox 72%   CXR: Diffuse bilateral lung opacities improved particularly on right Personally reviewed  On vent 80% PEEP 15. PlatP 30, Bi-vent setting.TVs up to 120-130s Personally reviewed  hgb 8.5 LDH 480  ECMO parameters Flow 4.95 RPM 3620 dP 33 Pressure -89   Objective:   Weight Range:  Vital Signs:   Temp:  [97.4 F (36.3 C)-98.2 F (36.8 C)] 97.5 F (36.4 C) (01/23 0430) Pulse Rate:  [60-131] 96 (01/23 0700) Resp:  [0-40] 0 (01/23 0700) BP: (118-119)/(40-50) 119/40 (01/22 1145) SpO2:  [85 %-100 %] 100 % (01/23 0700) Arterial Line BP: (94-170)/(40-73) 122/53 (01/23 0700) FiO2 (%):  [60 %-100 %] 100 % (01/23 0430) Last BM Date: 08/30/19  Weight change: Filed Weights   08/28/19 0500 08/29/19 0500 08/30/19 0700  Weight: (!) 156.4 kg (!) 149 kg (!) 145.3 kg    Intake/Output:   Intake/Output Summary (Last 24 hours) at 08/31/2019 0757 Last data filed at 08/31/2019 0700 Gross per 24 hour  Intake 5151.88 ml  Output 6885 ml  Net -1733.12 ml     Physical Exam: General: Sedated on vent. HEENT: normal + epistaixs. Left nare packed Neck: supple thick. RIJ cannula site ok  Cor: PMI nondisplaced. Regular rate & rhythm. No rubs, gallops or murmurs. Lungs: minimal air movement  Abdomen: obese soft, nontender, nondistended. No hepatosplenomegaly. No bruits or masses. Good bowel sounds. Extremities: no cyanosis, clubbing, rash, 1+ edema bilateral fem cannulas ok Neuro: alert & orientedx3,  cranial nerves grossly intact. moves all 4 extremities w/o difficulty. Affect pleasant    Telemetry: Sinus 80-90s Personally reviewed  Labs: Basic Metabolic Panel: Recent Labs  Lab 08/25/19 0445 08/25/19 0452 08/29/19 1600 08/29/19 1600 08/30/19 0427 08/30/19 0500 08/30/19 1310 08/30/19 1613 08/30/19 1958 08/31/19 0404 08/31/19 0421  NA 148*   < > 143  146*   < > 143   < > 143 143 138 143 145  K 4.1   < > 3.3*  3.2*   < > 3.3*   < > 3.8 3.6 3.2* 3.6 3.6  CL 113*   < > 103  --  101  --  100  --  99 102  --   CO2 25   < > 26  --  28  --  28  --  27 29  --   GLUCOSE 155*   < > 142*  --  123*  --  153*  --  156* 192*  --   BUN 39*   < > 30*  --  30*  --  29*  --  30* 33*  --   CREATININE 0.43*   < > 0.64  --  0.67  --  0.71  --  0.73 0.74  --   CALCIUM 9.3   < > 9.0   < > 8.9   < > 9.1  --  8.7* 8.6*  --  MG 2.0  --   --   --   --   --   --   --   --  1.5*  --    < > = values in this interval not displayed.    Liver Function Tests: Recent Labs  Lab 08/26/19 0320  AST 43*  ALT 65*  ALKPHOS 68  BILITOT 1.0  PROT 6.3*  ALBUMIN 3.4*   No results for input(s): LIPASE, AMYLASE in the last 168 hours. No results for input(s): AMMONIA in the last 168 hours.  CBC: Recent Labs  Lab 08/26/19 0306 08/26/19 0458 08/27/19 0448 08/27/19 0456 08/28/19 0406 08/28/19 0418 08/29/19 0355 08/29/19 0403 08/29/19 1600 08/29/19 1600 08/30/19 0427 08/30/19 0500 08/30/19 1137 08/30/19 1558 08/30/19 1613 08/31/19 0404 08/31/19 0421  WBC 5.2   < > 6.4   < > 8.5   < > 12.5*  --  11.2*  --  11.4*  --   --  12.3*  --  9.7  --   NEUTROABS 2.1  --  2.4  --  3.6  --  5.5  --   --   --  5.5  --   --   --   --   --   --   HGB 8.1*   < > 8.4*   < > 8.6*   < > 9.3*   < > 9.2*  9.9*   < > 9.9*   < > 11.2* 9.2* 9.9* 8.5* 11.2*  HCT 27.5*   < > 27.5*   < > 28.7*   < > 31.0*   < > 29.5*  29.0*   < > 31.8*   < > 33.0* 29.5* 29.0* 27.6* 33.0*  MCV 97.5   < > 95.8   < > 95.7   < > 93.7  --   91.3  --  92.4  --   --  91.6  --  92.0  --   PLT 92*   < > 92*   < > 101*   < > 108*  --  108*  --  109*  --   --  111*  --  108*  --    < > = values in this interval not displayed.    Cardiac Enzymes: No results for input(s): CKTOTAL, CKMB, CKMBINDEX, TROPONINI in the last 168 hours.  BNP: BNP (last 3 results) Recent Labs    07/22/19 1039  BNP 15.3    ProBNP (last 3 results) No results for input(s): PROBNP in the last 8760 hours.    Other results:  Imaging: DG Abd 1 View  Result Date: 08/29/2019 CLINICAL DATA:  Bedside gastric tube placement. EXAM: Portable ABDOMEN-1 VIEW 12:50 p.m.: COMPARISON:  Portable abdomen x-ray earlier same day at 10:14 a.m. and previously. FINDINGS: Since the examination earlier same day, the feeding tube has been removed. Nasogastric tube tip in the distal esophagus at the EG junction and should be advanced several centimeters. Hemodialysis device in the IVC as noted previously. Visualized upper abdominal bowel gas pattern unremarkable. Airspace consolidation with air bronchograms is identified in the visualized LEFT LOWER LOBE. IMPRESSION: 1. Nasogastric tube tip in the distal esophagus at the EG junction and the tube should be advanced several centimeters. 2. LEFT LOWER LOBE pneumonia. 3. No acute abdominal abnormality. These results will be called to the ordering clinician or representative by the Radiologist Assistant, and communication documented in the PACS or zVision Dashboard. Electronically Signed   By: Evangeline Dakin  M.D.   On: 08/29/2019 13:01   DG Abd 1 View  Result Date: 08/29/2019 CLINICAL DATA:  Feeding tube placement EXAM: ABDOMEN - 1 VIEW COMPARISON:  08/28/2019 FINDINGS: Enteric tube is unchanged in position with tip overlying the distal stomach. Femoral approach central venous catheter is again identified. Paucity of bowel gas again noted. IMPRESSION: Enteric tube tip overlies distal stomach. Electronically Signed   By: Macy Mis  M.D.   On: 08/29/2019 10:34   DG CHEST PORT 1 VIEW  Result Date: 08/30/2019 CLINICAL DATA:  Chest tube EXAM: PORTABLE CHEST 1 VIEW COMPARISON:  08/30/2019, 08/28/2019, 08/27/2019, 08/26/2019 FINDINGS: Tip of the tracheal tube is about 3.4 cm superior to the carina. Esophageal tube tip below the diaphragm but incompletely included. Bilateral chest tubes remain in place. Large bore catheter on the right, from superior approach, the tip overlies the proximal right atrium. From inferior approach, the tip overlies the expected location of IVC right atrial junction. Left-sided central venous catheter tip over the SVC. Low lung volumes. There is slightly improved aeration right greater than left. Fairly extensive bilateral lung consolidations remain. Probable small effusions. The cardiomediastinal silhouette is obscured. IMPRESSION: 1. Support lines and tubes as detailed above. 2. Low lung volumes with slightly improved aeration bilaterally. Fairly extensive residual left greater than right pulmonary airspace disease. Electronically Signed   By: Donavan Foil M.D.   On: 08/30/2019 20:45   DG Chest Port 1 View  Result Date: 08/30/2019 CLINICAL DATA:  Acute respiratory failure. Hypoxia. ECMO. EXAM: PORTABLE CHEST 1 VIEW COMPARISON:  Radiograph yesterday. FINDINGS: Tracheostomy tube tip remains at the thoracic inlet. Enteric tube tip and side-port below the diaphragm in the stomach. Bilateral chest tubes in place. Right internal jugular ECMO catheter tip the right atrium. Inferior approach ECMO catheter at the IVC atrial caval junction. Left subclavian central line remains in place. Right upper extremity PICC remains in place. Improved right lung base aeration from prior exam. Heterogeneous bilateral lung opacities are otherwise unchanged with central air bronchograms on the left. No visualized pneumothorax. IMPRESSION: Improved right lung base aeration. Otherwise unchanged appearance of the chest with diffuse  bilateral lung opacities. Stable support apparatus. Electronically Signed   By: Keith Rake M.D.   On: 08/30/2019 06:17     Medications:     Scheduled Medications: . sodium chloride   Intravenous Once  . artificial tears  1 application Both Eyes X5M  . vitamin C  500 mg Per Tube Daily  . chlorhexidine gluconate (MEDLINE KIT)  15 mL Mouth Rinse BID  . Chlorhexidine Gluconate Cloth  6 each Topical Daily  . diazepam  20 mg Per Tube Q6H  . feeding supplement (PRO-STAT SUGAR FREE 64)  60 mL Per Tube BID  . gabapentin  450 mg Per Tube Q8H  . HYDROmorphone  8 mg Per Tube Q4H  . insulin aspart  3-9 Units Subcutaneous Q4H  . ipratropium-albuterol  3 mL Nebulization Q4H  . mouth rinse  15 mL Mouth Rinse 10 times per day  . montelukast  10 mg Per Tube QHS  . oxymetazoline  1 spray Each Nare BID  . pantoprazole sodium  40 mg Per Tube QHS  . QUEtiapine  200 mg Per Tube BID  . sodium chloride flush  10-40 mL Intracatheter Q12H  . sodium chloride flush  10-40 mL Intracatheter Q12H  . Thrombi-Pad  1 each Topical Once  . zinc sulfate  220 mg Per Tube Daily    Infusions: . sodium chloride    .  sodium chloride 10 mL/hr at 08/29/19 1714  . sodium chloride Stopped (08/30/19 1638)  . albumin human Stopped (08/28/19 0537)  . cefTRIAXone (ROCEPHIN)  IV Stopped (08/30/19 1039)  . cisatracurium (NIMBEX) infusion Stopped (08/30/19 1620)  . dexmedetomidine (PRECEDEX) IV infusion 1.6 mcg/kg/hr (08/31/19 0700)  . dextrose    . feeding supplement (PIVOT 1.5 CAL) 1,000 mL (08/31/19 0726)  . furosemide (LASIX) infusion 3 mg/hr (08/31/19 0700)  . heparin 2,750 Units/hr (08/31/19 0700)  . HYDROmorphone 20 mg/hr (08/31/19 0700)  . magnesium sulfate bolus IVPB 50 mL/hr at 08/31/19 0700  . midazolam (VERSED) infusion 40 mg/hr (08/31/19 0700)  . norepinephrine 11 mcg/min (08/31/19 0700)    PRN Medications: Place/Maintain arterial line **AND** sodium chloride, sodium chloride, sodium chloride,  acetaminophen (TYLENOL) oral liquid 160 mg/5 mL, acetaminophen, albumin human, dextrose, hydrALAZINE, HYDROmorphone, magnesium hydroxide, metoprolol tartrate, midazolam, ondansetron (ZOFRAN) IV, senna-docusate, sodium chloride, sodium chloride flush, sodium chloride flush, sodium chloride flush   Assessment/Plan:    1. Acute hypoxic/hypercarbic respiratory failure due to COVID PNA: Remained markedly hypoxic despite full support. Intubated 12/26. S/p bilateral CTs 12/26 for pneumothoraces.  TEE on 12/26 LVEF 70% RV ok. VV ECMO begun 12/26. Tracheostomy 12/29.  COVID ARDS at this point.  Flows stable and sats improved with deeper sedation and re-paralysis. ECMO circuit changed 1/8 & 1/23 - ECMO parameters stable.  - RIJ venous drainage cannula added 1/20. Circuit changed 1/23 - Flows look good.  - Has been diuresing well. Weight down 24 pounds in 2 days but still up from baseline. Pressor requirements up.  Continue low dose lasix gtt - CXR and sats improving with diuresis and additional drainage cannula. TV now up to 120-130.Continue to follow - Main issue is bad lungs. Continue maneuvers to improve lung compliance - chest PT, diuresis, standing position. Bronch as needed Will likely need bronch today with epistaxis and mucus plugging. Repack nose. D/w Dr. Lynetta Mare - Continue to wait on lung recovery - LDH 480. Watch closely - Continue sedation. Off nimbex. Can use propofol for breakthrough  2. Bilateral PTX: Due to barotrauma.  - Driving pressure minimized at < 15 Stable. No change  3. COVID PNA: management of COVID infection per CCM. CXR with bilateral multifocal PNA progressing to likely ARDS.  - s/p 10 days of remdesivir - 12/16, 12/2 s/p tociluzimab - 12/17 s/p convalescent plasma - Decadron at 62m/daily completed 1/11 - Completed cefepime/vanc - On ceftriaxone for nasal packing  4. ID: Empiric coverage with vancomycin/cefepime initially for possible secondary bacterial PNA, course  completed.   - treated with cefepime and vanc for + cx with staph epidimidis, MSSA and serratia (sensitivities ok). - Cover with ceftriaxone for nasal packing - Remains stable - D/w PharmD  5. Thrombocytopenia: Slow decrease in platelets, likely low level hemolysis + critical illness.  - Stable 108k this am.  - On hepain. No bleeding. Discussed dosing with PharmD personally.  6. Anemia: Bleeding at trach site resolved but still with epistaxis - hgb 8.5   today - Transfuse hgb < 8  7. FEN - continue TFs  D/w with ECMO team (ECMO coordinator/specialists, CCM, TCTS and pharmD) at bedside on multidisciplinary rounds. Continue to await ARDS recovery. Details as above.   Length of Stay: 40  CRITICAL CARE Performed by: BGlori Bickers Total critical care time: 40 minutes  Critical care time was exclusive of separately billable procedures and treating other patients.  Critical care was necessary to treat or prevent imminent or life-threatening deterioration.  Critical care was time spent personally by me (independent of midlevel providers or residents) on the following activities: development of treatment plan with patient and/or surrogate as well as nursing, discussions with consultants, evaluation of patient's response to treatment, examination of patient, obtaining history from patient or surrogate, ordering and performing treatments and interventions, ordering and review of laboratory studies, ordering and review of radiographic studies, pulse oximetry and re-evaluation of patient's condition.    Glori Bickers MD 08/31/2019, 7:57 AM  Advanced Heart Failure Team Pager [phone] (M-F; 7a - 4p)  Please contact [facility] for night-coverage after hours (4p -7a ) and weekends on [email or web address]

## 2019-08-31 NOTE — Brief Op Note (Signed)
07/22/2019 - 08/31/2019  11:26 AM  PATIENT:  [name]  44 y.o. male  PRE-OPERATIVE DIAGNOSIS:  nose bleed  POST-OPERATIVE DIAGNOSIS:  SAME  PROCEDURE:  Procedure(s): NASAL ENDOSCOPY WITH EPISTAXIS CONTROL WITH CAUTERIZATION (N/A)  SURGEON:  Surgeon(s) and Role:    * Melida Quitter, MD - Primary  PHYSICIAN ASSISTANT:   ASSISTANTS: none   ANESTHESIA:   general  EBL: Minimal  BLOOD ADMINISTERED:none  DRAINS: none   LOCAL MEDICATIONS USED:  NONE  SPECIMEN:  No Specimen  DISPOSITION OF SPECIMEN:  N/A  COUNTS:  YES  TOURNIQUET:  * No tourniquets in log *  DICTATION: .Other Dictation: Dictation Number [id number]  PLAN OF CARE: ICU care  PATIENT DISPOSITION:  ICU - intubated and critically ill.   Delay start of Pharmacological VTE agent (>24hrs) due to surgical blood loss or risk of bleeding: not applicable

## 2019-08-31 NOTE — OR Nursing (Signed)
Two pledgets left in each nostril post procedure (total of 4)

## 2019-08-31 NOTE — Anesthesia Preprocedure Evaluation (Deleted)
Anesthesia Evaluation   Patient unresponsive    Reviewed: Allergy & Precautions, H&P , NPO status , Patient's Chart, lab work & pertinent test results, reviewed documented beta blocker date and time   History of Anesthesia Complications (+) PONV  Airway Mallampati: Intubated       Dental no notable dental hx. (+) Teeth Intact, Dental Advisory Given   Pulmonary asthma , pneumonia,  Respiratory Failure Covid pneumonia, ARDS   + rhonchi        Cardiovascular hypertension, Pt. on medications and Pt. on home beta blockers  Rhythm:Regular Rate:Normal  FINDINGS:  LEFT VENTRICLE: Ventricle small and underfilled. EF = 70%. No regional wall motion abnormalities.  RIGHT VENTRICLE: Normal size and function.   LEFT ATRIUM: Normal  LEFT ATRIAL APPENDAGE: Not visulaized  RIGHT ATRIUM: Normal  AORTIC VALVE:  Trileaflet. Normal  MITRAL VALVE:    Normal. Trivial MR  TRICUSPID VALVE: Normal. Mild TR  PULMONIC VALVE: Not well visualized  INTERATRIAL SEPTUM: No obvious ASD.  PERICARDIUM: No effusion  DESCENDING AORTA: Not well visualized   Neuro/Psych  Headaches, negative psych ROS   GI/Hepatic Neg liver ROS, GERD  Medicated,  Endo/Other  Morbid obesity  Renal/GU negative Renal ROS  negative genitourinary   Musculoskeletal  (+) Arthritis , Osteoarthritis,    Abdominal   Peds  Hematology negative hematology ROS (+)   Anesthesia Other Findings   Reproductive/Obstetrics negative OB ROS                            Anesthesia Physical  Anesthesia Plan  ASA: IV  Anesthesia Plan: General   Post-op Pain Management:    Induction: Intravenous  PONV Risk Score and Plan: 2 and Treatment may vary due to age or medical condition  Airway Management Planned: Oral ETT  Additional Equipment:   Intra-op Plan:   Post-operative Plan: Post-operative intubation/ventilation  Informed  Consent:   Plan Discussed with: CRNA  Anesthesia Plan Comments: (TEE for monitoring only)        Anesthesia Quick Evaluation

## 2019-08-31 NOTE — Progress Notes (Signed)
ANTICOAGULATION CONSULT NOTE - Follow Up Consult  Pharmacy Consult for heparin Indication: ECMO  Patient Measurements: Height: 5' 8"  (172.7 cm) Weight: (!) 328 lb 7.8 oz (149 kg) IBW/kg (Calculated) : 68.4 Heparin Dosing Weight: 104.5kg  Vital Signs: Temp: 97.5 F (36.4 C) (01/21 1600) Temp Source: Axillary (01/21 1600) Pulse Rate: 87 (01/21 1700)  Labs: Recent Labs    08/29/19 1600 08/29/19 1600 08/30/19 0427 08/30/19 0500 08/30/19 1137 08/30/19 1310 08/30/19 1558 08/30/19 1558 08/30/19 1600 08/30/19 1613 08/30/19 1613 08/30/19 1958 08/31/19 0404 08/31/19 0421  HGB 9.2*  9.9*   < > 9.9*   < >   < >  --  9.2*   < >  --  9.9*   < >  --  8.5* 11.2*  HCT 29.5*  29.0*   < > 31.8*   < >   < >  --  29.5*   < >  --  29.0*  --   --  27.6* 33.0*  PLT 108*   < > 109*  --   --   --  111*  --   --   --   --   --  108*  --   APTT 80*  --  108*  --   --   --   --   --  123*  --   --   --   --   --   HEPARINUNFRC 0.31   < > 0.37  --   --   --   --   --   --   --   --  0.46 0.38  --   CREATININE 0.64   < > 0.67   < >  --  0.71  --   --   --   --   --  0.73 0.74  --    < > = values in this interval not displayed.    Assessment: 44yo male on VV ECMO on heparin infusion. Heparin therapeutic, CBC stable, LDH down.  Goal of Therapy:  Heparin level 0.3-0.5 units/ml   Plan:  Continue heparin gtt at 2750 units/hr  Monitor daily HL, CBC, plt Monitor for signs/symptoms of bleeding   Arrie Senate, PharmD, BCPS Clinical Pharmacist [phone] Please check AMION for all [place] numbers 08/31/2019

## 2019-08-31 NOTE — Progress Notes (Signed)
ANTICOAGULATION CONSULT NOTE - Follow Up Consult  Pharmacy Consult for heparin Indication: ECMO  Patient Measurements: Height: 5' 8"  (172.7 cm) Weight: (!) 328 lb 7.8 oz (149 kg) IBW/kg (Calculated) : 68.4 Heparin Dosing Weight: 104.5kg  Vital Signs: Temp: 97.5 F (36.4 C) (01/21 1600) Temp Source: Axillary (01/21 1600) Pulse Rate: 87 (01/21 1700)  Labs: Recent Labs    08/30/19 0427 08/30/19 0500 08/30/19 1558 08/30/19 1600 08/30/19 1613 08/30/19 1958 08/31/19 0404 08/31/19 0421 08/31/19 1150 08/31/19 1150 08/31/19 1630 08/31/19 1638  HGB 9.9*   < >   < >  --    < >  --  8.5*   < > 9.0*   < > 9.2* 9.2*  HCT 31.8*   < >   < >  --    < >  --  27.6*   < > 29.7*  --  29.0* 27.0*  PLT 109*   < >   < >  --   --   --  108*  --  116*  --  116*  --   APTT 108*  --   --  123*  --   --  127*  --   --   --   --   --   HEPARINUNFRC 0.37   < >  --   --   --  0.46 0.38  --   --   --  0.47  --   CREATININE 0.67   < >  --   --    < > 0.73 0.74  --  0.65  --   --   --    < > = values in this interval not displayed.    Assessment: 44yo male on VV ECMO on heparin infusion. Heparin therapeutic, CBC stable, LDH down.  Goal of Therapy:  Heparin level 0.3-0.5 units/ml   Plan:  Continue heparin gtt at 2750 units/hr  Monitor heparin level and CBC 5AM/5PM, daily LDH Monitor for signs/symptoms of bleeding   Vertis Kelch, PharmD, [facility] PGY2 Cardiology Pharmacy Resident Phone [phone] 08/31/2019       5:26 PM  Please check [email or web address] for unit-specific pharmacist phone numbers

## 2019-08-31 NOTE — Progress Notes (Signed)
Assisted tele visit to patient with family members.  Teauna Dubach, Janace Hoard, RN

## 2019-08-31 NOTE — Progress Notes (Signed)
   Palliative Medicine Inpatient Follow Up Note   HPI: Per intensivist notes --> 44 y/o M admitted 12/14 with COVIDpneumonia causing acutehypoxemic respiratory failure. Developed pneumomediastinum 12/23 with concern for possible bilateral small pneumothoraces on 12/24. PCCM consulted for evaluation of pneumothoraces. ECMO initiated thereafter.   Today's Discussion (08/31/2019): Chart reviewed. Checked in with nursing team. Appears that patient had an ECMO circuit exchange yesterday. S/P bronchoscopy this morning. ENT arranging a bedside nasal cautery. Plan to back off  on paralytics today. Gradual improvement of aeration on imaging studies.  Patients wife, [name] called to provide an update. She stated that the family is having a Zoom call today to see him. She said that she feels reassured by the small improvements he has made.   All questions answered.   Will continue to communicate with family incrementally.   Time Spent: 15 Greater than 50% of the time was spent in counseling and coordination of care ______________________________________________________________________________________ [place] Team Team Cell Phone: [phone] Please utilize secure chat with additional questions, if there is no response within 30 minutes please call the above phone number  Palliative Medicine Team providers are available by phone from 7am to 7pm daily and can be reached through the team cell phone.  Should this patient require assistance outside of these hours, please call the patient's attending physician.

## 2019-08-31 NOTE — Progress Notes (Signed)
RT NOTE: RT weaned nitric to 10ppm per Dr.Agarwala order. RT will continue to monitor.

## 2019-08-31 NOTE — Op Note (Signed)
NAME: JOAOVICTOR, KRONE MEDICAL RECORD AU:[id number] ACCOUNT [id number] DATE OF BIRTH:1976/06/18 FACILITY: [facility] LOCATION: [facility] PHYSICIAN:Jazell Rosenau Guido Sander, MD  OPERATIVE REPORT  DATE OF PROCEDURE:  08/31/2019  PREOPERATIVE DIAGNOSIS:  Epistaxis and anticoagulation.  POSTOPERATIVE DIAGNOSIS:  Epistaxis and anticoagulation.  PROCEDURE:  Nasal endoscopy with control of bilateral posterior epistaxis.  SURGEON:  Melida Quitter, MD  ANESTHESIA:  General anesthesia.  COMPLICATIONS:  None.  INDICATIONS:  The patient is a 44 year old male who is being treated with ECMO due to respiratory failure from [place].  Anticoagulation necessary for ECMO has been associated with ongoing epistaxis that has been treated with bilateral anterior-posterior  packs repeatedly for the last few weeks.  With continued bleeding, the decision was made to evaluate with nasal endoscopy to try to improve control.  FINDINGS:  Packs were removed from both sides of the nose.  There was clot filling much of both nasal passages and the nasopharynx.  The septum was deviated toward the left with a spur to the left that narrows the nasal passage on the left side.  There  were bleeding places from almost all the surfaces of both nasal passages.  Cautery was used on certain specific points.  DESCRIPTION OF PROCEDURE:  The patient was identified in the intensive care unit and informed consent was obtained from the wife including discussion of risks, benefits and alternatives.  The patient was already under sedation for ECMO.  Drapes were  applied and the right nasal pack was removed.  Clot was suctioned and removed with forceps, exposing the nasal passage.  Afrin-soaked pledgets were placed.  The left-sided pack was removed and the same thing was done.  There was more crowding of the left  nasal passage.  The pledgets were then removed from the right side and additional suction performed.  Suction cautery was then used to cauterize a  few of the more active bleeding sites, including on the middle turbinate and septum.  Afrin pledgets were  replaced.  The same was then performed on the left side, including lateralization of the middle turbinate for better access.  Points were cauterized on the middle turbinate and the septum on the left side as well.  The posterior nasal passage on the left  was difficult to visualize.  Pledgets were replaced.  Pledgets were then removed from the right and then the left side and about half a syringe of Surgifoam was placed in each side.  Two Afrin-soaked pledgets were then placed in each side to hold the  Surgifoam in place.  The oropharynx was then suctioned and no active bleeding was witnessed.  Gauze was taped under the nose and he was then returned to nursing care in stable, but critical condition.  VN/NUANCE  D:08/31/2019 T:08/31/2019 JOB:[id number]

## 2019-08-31 NOTE — Procedures (Signed)
Bronchoscopy Procedure Note [name] [id number] 1976-01-12  Procedure: Bronchoscopy Indications: Remove secretions  Procedure Details Consent: Risks of procedure as well as the alternatives and risks of each were explained to the (patient/caregiver).  Consent for procedure obtained. Time Out: Verified patient identification, verified procedure, site/side was marked, verified correct patient position, special equipment/implants available, medications/allergies/relevent history reviewed, required imaging and test results available.  Performed  In preparation for procedure, patient was given 100% FiO2 and bronchoscope lubricated. Sedation: Benzodiazepines and narcotics  Airway entered and the following bronchi were examined: RUL, RML, RLL, LUL, LLL and Bronchi.   Procedures performed: small amount of white secretions removed from LLL/LUL.  Bronchoscope removed.    Evaluation Hemodynamic Status: BP stable throughout; O2 sats: transiently fell during during procedure Patient's Current Condition: stable Specimens:  None Complications: No apparent complications Patient did tolerate procedure well.   Einar Grad [name] 08/31/2019

## 2019-09-01 ENCOUNTER — Inpatient Hospital Stay (HOSPITAL_COMMUNITY): Payer: Managed Care, Other (non HMO)

## 2019-09-01 LAB — POCT I-STAT 7, (LYTES, BLD GAS, ICA,H+H)
Acid-Base Excess: 4 mmol/L — ABNORMAL HIGH (ref 0.0–2.0)
Acid-Base Excess: 4 mmol/L — ABNORMAL HIGH (ref 0.0–2.0)
Acid-Base Excess: 5 mmol/L — ABNORMAL HIGH (ref 0.0–2.0)
Acid-Base Excess: 6 mmol/L — ABNORMAL HIGH (ref 0.0–2.0)
Acid-Base Excess: 6 mmol/L — ABNORMAL HIGH (ref 0.0–2.0)
Bicarbonate: 29.6 mmol/L — ABNORMAL HIGH (ref 20.0–28.0)
Bicarbonate: 29.7 mmol/L — ABNORMAL HIGH (ref 20.0–28.0)
Bicarbonate: 29.8 mmol/L — ABNORMAL HIGH (ref 20.0–28.0)
Bicarbonate: 29.8 mmol/L — ABNORMAL HIGH (ref 20.0–28.0)
Bicarbonate: 30.4 mmol/L — ABNORMAL HIGH (ref 20.0–28.0)
Calcium, Ion: 1.23 mmol/L (ref 1.15–1.40)
Calcium, Ion: 1.24 mmol/L (ref 1.15–1.40)
Calcium, Ion: 1.28 mmol/L (ref 1.15–1.40)
Calcium, Ion: 1.28 mmol/L (ref 1.15–1.40)
Calcium, Ion: 1.28 mmol/L (ref 1.15–1.40)
HCT: 25 % — ABNORMAL LOW (ref 39.0–52.0)
HCT: 26 % — ABNORMAL LOW (ref 39.0–52.0)
HCT: 26 % — ABNORMAL LOW (ref 39.0–52.0)
HCT: 26 % — ABNORMAL LOW (ref 39.0–52.0)
HCT: 26 % — ABNORMAL LOW (ref 39.0–52.0)
Hemoglobin: 8.5 g/dL — ABNORMAL LOW (ref 13.0–17.0)
Hemoglobin: 8.8 g/dL — ABNORMAL LOW (ref 13.0–17.0)
Hemoglobin: 8.8 g/dL — ABNORMAL LOW (ref 13.0–17.0)
Hemoglobin: 8.8 g/dL — ABNORMAL LOW (ref 13.0–17.0)
Hemoglobin: 8.8 g/dL — ABNORMAL LOW (ref 13.0–17.0)
O2 Saturation: 85 %
O2 Saturation: 88 %
O2 Saturation: 90 %
O2 Saturation: 91 %
O2 Saturation: 93 %
Patient temperature: 36.1
Patient temperature: 36.1
Patient temperature: 97.3
Patient temperature: 98
Patient temperature: 98.5
Potassium: 4 mmol/L (ref 3.5–5.1)
Potassium: 4.2 mmol/L (ref 3.5–5.1)
Potassium: 4.2 mmol/L (ref 3.5–5.1)
Potassium: 4.2 mmol/L (ref 3.5–5.1)
Potassium: 4.3 mmol/L (ref 3.5–5.1)
Sodium: 146 mmol/L — ABNORMAL HIGH (ref 135–145)
Sodium: 146 mmol/L — ABNORMAL HIGH (ref 135–145)
Sodium: 146 mmol/L — ABNORMAL HIGH (ref 135–145)
Sodium: 147 mmol/L — ABNORMAL HIGH (ref 135–145)
Sodium: 147 mmol/L — ABNORMAL HIGH (ref 135–145)
TCO2: 31 mmol/L (ref 22–32)
TCO2: 31 mmol/L (ref 22–32)
TCO2: 31 mmol/L (ref 22–32)
TCO2: 31 mmol/L (ref 22–32)
TCO2: 32 mmol/L (ref 22–32)
pCO2 arterial: 38.6 mmHg (ref 32.0–48.0)
pCO2 arterial: 40.6 mmHg (ref 32.0–48.0)
pCO2 arterial: 41.8 mmHg (ref 32.0–48.0)
pCO2 arterial: 48.6 mmHg — ABNORMAL HIGH (ref 32.0–48.0)
pCO2 arterial: 48.7 mmHg — ABNORMAL HIGH (ref 32.0–48.0)
pH, Arterial: 7.392 (ref 7.350–7.450)
pH, Arterial: 7.393 (ref 7.350–7.450)
pH, Arterial: 7.457 — ABNORMAL HIGH (ref 7.350–7.450)
pH, Arterial: 7.481 — ABNORMAL HIGH (ref 7.350–7.450)
pH, Arterial: 7.49 — ABNORMAL HIGH (ref 7.350–7.450)
pO2, Arterial: 49 mmHg — ABNORMAL LOW (ref 83.0–108.0)
pO2, Arterial: 51 mmHg — ABNORMAL LOW (ref 83.0–108.0)
pO2, Arterial: 53 mmHg — ABNORMAL LOW (ref 83.0–108.0)
pO2, Arterial: 59 mmHg — ABNORMAL LOW (ref 83.0–108.0)
pO2, Arterial: 61 mmHg — ABNORMAL LOW (ref 83.0–108.0)

## 2019-09-01 LAB — CBC
HCT: 28.5 % — ABNORMAL LOW (ref 39.0–52.0)
HCT: 28.1 % — ABNORMAL LOW (ref 39.0–52.0)
Hemoglobin: 8.6 g/dL — ABNORMAL LOW (ref 13.0–17.0)
Hemoglobin: 8.4 g/dL — ABNORMAL LOW (ref 13.0–17.0)
MCH: 28.5 pg (ref 26.0–34.0)
MCH: 28.6 pg (ref 26.0–34.0)
MCHC: 30.2 g/dL (ref 30.0–36.0)
MCHC: 29.9 g/dL — ABNORMAL LOW (ref 30.0–36.0)
MCV: 94.4 fL (ref 80.0–100.0)
MCV: 95.6 fL (ref 80.0–100.0)
Platelets: 122 10*3/uL — ABNORMAL LOW (ref 150–400)
Platelets: 124 10*3/uL — ABNORMAL LOW (ref 150–400)
RBC: 3.02 MIL/uL — ABNORMAL LOW (ref 4.22–5.81)
RBC: 2.94 MIL/uL — ABNORMAL LOW (ref 4.22–5.81)
RDW: 17.9 % — ABNORMAL HIGH (ref 11.5–15.5)
RDW: 18.2 % — ABNORMAL HIGH (ref 11.5–15.5)
WBC: 9.1 10*3/uL (ref 4.0–10.5)
WBC: 8.3 10*3/uL (ref 4.0–10.5)
nRBC: 1 % — ABNORMAL HIGH (ref 0.0–0.2)
nRBC: 1.1 % — ABNORMAL HIGH (ref 0.0–0.2)

## 2019-09-01 LAB — BASIC METABOLIC PANEL
Anion gap: 11 (ref 5–15)
BUN: 35 mg/dL — ABNORMAL HIGH (ref 6–20)
CO2: 28 mmol/L (ref 22–32)
Calcium: 9 mg/dL (ref 8.9–10.3)
Chloride: 106 mmol/L (ref 98–111)
Creatinine, Ser: 0.55 mg/dL — ABNORMAL LOW (ref 0.61–1.24)
GFR calc Af Amer: >60 mL/min (ref >60)
GFR calc non Af Amer: >60 mL/min (ref >60)
Glucose, Bld: 177 mg/dL — ABNORMAL HIGH (ref 70–99)
Potassium: 4 mmol/L (ref 3.5–5.1)
Sodium: 145 mmol/L (ref 135–145)

## 2019-09-01 LAB — MAGNESIUM: Magnesium: 2.3 mg/dL (ref 1.7–2.4)

## 2019-09-01 LAB — COOXEMETRY PANEL
Carboxyhemoglobin: 2.7 % — ABNORMAL HIGH (ref 0.5–1.5)
Methemoglobin: 0.6 % (ref 0.0–1.5)
O2 Saturation: 61.5 %
Total hemoglobin: 17.6 g/dL — ABNORMAL HIGH (ref 12.0–16.0)

## 2019-09-01 LAB — HEPARIN LEVEL (UNFRACTIONATED)
Heparin Unfractionated: 0.31 IU/mL (ref 0.30–0.70)
Heparin Unfractionated: 0.51 IU/mL (ref 0.30–0.70)

## 2019-09-01 LAB — GLUCOSE, CAPILLARY
Glucose-Capillary: 172 mg/dL — ABNORMAL HIGH (ref 70–99)
Glucose-Capillary: 160 mg/dL — ABNORMAL HIGH (ref 70–99)
Glucose-Capillary: 148 mg/dL — ABNORMAL HIGH (ref 70–99)
Glucose-Capillary: 160 mg/dL — ABNORMAL HIGH (ref 70–99)
Glucose-Capillary: 157 mg/dL — ABNORMAL HIGH (ref 70–99)

## 2019-09-01 LAB — LACTATE DEHYDROGENASE: LDH: 421 U/L — ABNORMAL HIGH (ref 98–192)

## 2019-09-01 LAB — LACTIC ACID, PLASMA: Lactic Acid, Venous: 1.3 mmol/L (ref 0.5–1.9)

## 2019-09-01 LAB — APTT
aPTT: 149 seconds — ABNORMAL HIGH (ref 24–36)
aPTT: 133 seconds — ABNORMAL HIGH (ref 24–36)

## 2019-09-01 LAB — PHOSPHORUS: Phosphorus: 5.4 mg/dL — ABNORMAL HIGH (ref 2.5–4.6)

## 2019-09-01 MED ORDER — FUROSEMIDE 10 MG/ML IJ SOLN
20.0000 mg | Freq: Once | INTRAMUSCULAR | Status: AC
  Administered 2019-09-01: 18:00:00 20 mg via INTRAVENOUS
  Filled 2019-09-01: qty 2

## 2019-09-01 NOTE — Progress Notes (Signed)
NAME:  [name], MRN:  [id number], DOB:  [birth year], LOS: 43 ADMISSION DATE:  07/22/2019, CONSULTATION DATE:  12/24 REFERRING MD:  Sloan Leiter, CHIEF COMPLAINT:  Dyspnea   Brief History   44 y/o M admitted 12/14 with COVID pneumonia causing acute hypoxemic respiratory failure. Developed pneumomediastinum 12/23 with concern for possible bilateral small pneumothoraces on 12/24.  PCCM consulted for evaluation of pneumothoraces  Past Medical History  GERD HTN Asthma  Significant Hospital Events   12/14 admit with hypoxemic respiratory failure in setting of COVID-19 pneumonia 12/23 noted to have pneumomediastinum on chest x-ray 12/24 PCCM consulted for evaluation 12/26 worsening hypoxemia, intubated  12/27 VV fem/fem ECMO cannulation See bedside nursing event notes for full rundown of procedures after 12/27   Consults:  PCCM, CTS, CHF, Palliative  Procedures:  See bedside nursing event notes for full rundown of procedures.  Significant Diagnostic Tests:  CT chest 12/22 >> extensive pneumomediastinum, multifocal patchy bilateral groundglass opacities  Micro Data:  BCx2 12/14 >> negative  Sputum 12/15 >> negative  1/9 bronch: serratia and MSSA 1/2 acid fast smear bronch: negative 1/2 fungal cx bronch: pending 1/12 BAL: serratia 1/18 BAL >>  Antimicrobials:  Received remdesivir, steroids, actemra and convalescent plasma  5 days cefepime 12/24-12/29 Cefepime 1/4->1/14 vanc 1/4->1/14 Ceftriaxone 1/14 >>   Interim history/subjective:  Improving oxygenation. Decreased epistaxis following ENT cautery and packing.  Objective   Blood pressure (!) 119/40, pulse 70, temperature (!) 97 F (36.1 C), temperature source Core, resp. rate 15, height _0  (1.727 m), weight (!) 142.7 kg, SpO2 93 %. CVP:  [6 mmHg-15 mmHg] 11 mmHg  Vent Mode: PCV FiO2 (%):  [60 %] 60 % Set Rate:  [15 bmp] 15 bmp PEEP:  [15 cmH20] 15 cmH20 Plateau Pressure:  [28 cmH20-29 cmH20] 28 cmH20    Intake/Output Summary (Last 24 hours) at 09/01/2019 1309 Last data filed at 09/01/2019 1200 Gross per 24 hour  Intake 4098.43 ml  Output 2600 ml  Net 1498.43 ml   Filed Weights   08/29/19 0500 08/30/19 0700 09/01/19 0600  Weight: (!) 149 kg (!) 145.3 kg (!) 142.7 kg   Examination GEN: obese ill man on vent HEENT: trach in place, continues to have epistaxis CV: RRR, ext warm PULM: poor air movement, passive on vent.  Vesicular breathing anterior.  GI: Soft, + BS EXT: Global anascarca has improved significantly - mild statis dermatitis of LLE NEURO: sedated, NMB off. PSYCH: RASS -4 SKIN: pale Femoral cannulas in place, sites looks okay Bilateral chest tubes in place, no tidaling or air leak, ongoing serosanguinous output Foley in place dark yellow urine Rectal tube in place with tan loose stool   CXR improving oxygenation of upper lung fields with dense consolidation at bases bilaterally.  Now better able to see the diaphragms. Continued dense consolidation of left base.  Lung ultrasound shows continued dense consolidation of both lungs particularly on the left.   Flow (LPM): 5.09 ECMO Mode: VV ECMO Device: Cardiohelp   $ Ventilator Initial/Subsequent : Subsequent, Vent Mode: PCV, Set Rate: 15 bmp, FiO2 (%): 60 %, I Time: 0.9 Sec(s), PEEP: 15 cmH20  Net IO Since Admission: 34,059.71 mL [09/01/19 [facility] Problem list   MSSA and serratia marcescens pneumonia   Assessment & Plan:    Acute hypoxemic respiratory failure with ARDS secondary to COVID-19, s/p tracheostomy requiring mechanical ventilation and VV ECMO support.  COVID has been treated with all evidence based therapies, we are dealing with lingering severe  diffuse alveolar damage. Marked improvement in aeration of both lungs. -Continue ECMO at current flow rate.  -Bronchoscopy for secretion/clot clearance periodically. -Patient back to and tolerating PCV. Keep Driving pressure 15 of  less. -Heparin with 0.3-0.5 goal  -Decrease rate of diuresis to avoid increasing pressor  - Off NMB, wean NO - Wean off versed. Continue suction  -20cmH2O  Posterior epistaxis.  Repacked multiple times  Cortrack removed and epistaxis appears to have resolved. Both nostrils repacked 1/23 by ENT  Daily Goals Checklist  Pain/Anxiety/Delirium protocol (if indicated): Continue current oral regimen.  Progressive wean on paralytics.  Wean sedation then as tolerated. VAP protocol (if indicated): Bundle in place. Respiratory support goals: Continue PCV with ultra-lung protective ventilation, generous PEEP.  PEEP currently optimized. Continue to oversleep as may help with air hunger during sedation wean. Blood pressure target:  MAP>65, off pressors DVT prophylaxis: on systemic anticoagulation on VV-ECMO -  Nutrition Status: Nutrition Problem: Increased nutrient needs Etiology: acute illness(COVID PNA) Signs/Symptoms: estimated needs Interventions: Tube feeding, Prostat GI prophylaxis: Protonix per tube. Fluid status goals: Continue aggressive diuresis as tolerated aim for further 2 L/day Urinary catheter: Guide hemodynamic management Glucose control: euglycemic on SSI. Mobility/therapy needs: Bedrest.  Daily labs: as per ECMO protocol. Code Status: Full code  Family Communication: updated mother at the bedside today. Disposition: ICU   Labs   CBC: CBC Latest Ref Rng & Units 09/01/2019 09/01/2019 09/01/2019  WBC 4.0 - 10.5 K/uL - - 9.1  Hemoglobin 13.0 - 17.0 g/dL 8.5(L) 8.8(L) 8.6(L)  Hematocrit 39.0 - 52.0 % 25.0(L) 26.0(L) 28.5(L)  Platelets 150 - 400 K/uL - - 122(L)    Basic Metabolic Panel: BMP Latest Ref Rng & Units 09/01/2019 09/01/2019 09/01/2019  Glucose 70 - 99 mg/dL - - 177(H)  BUN 6 - 20 mg/dL - - 35(H)  Creatinine 0.61 - 1.24 mg/dL - - 0.55(L)  Sodium 135 - 145 mmol/L 146(H) 146(H) 145  Potassium 3.5 - 5.1 mmol/L 4.2 4.0 4.0  Chloride 98 - 111 mmol/L - - 106  CO2 22 -  32 mmol/L - - 28  Calcium 8.9 - 10.3 mg/dL - - 9.0    GFR: Estimated Creatinine Clearance: 161.8 mL/min (A) (by C-G formula based on SCr of 0.55 mg/dL (L)). Recent Labs  Lab 08/29/19 0355 08/29/19 1600 08/30/19 0427 08/30/19 1558 08/31/19 0404 08/31/19 1150 08/31/19 1630 09/01/19 0414  WBC 12.5*   < > 11.4*   < > 9.7 8.8 8.9 9.1  LATICACIDVEN 0.9  --  1.2  --  0.9  --   --  1.3   < > = values in this interval not displayed.    Liver Function Tests: Recent Labs  Lab 08/26/19 0320 08/31/19 1150  AST 43* 24  ALT 65* 26  ALKPHOS 68 75  BILITOT 1.0 0.7  PROT 6.3* 6.2*  ALBUMIN 3.4* 2.2*   No results for input(s): LIPASE, AMYLASE in the last 168 hours. No results for input(s): AMMONIA in the last 168 hours.  ABG    Component Value Date/Time   PHART 7.490 (H) 09/01/2019 0925   PCO2ART 38.6 09/01/2019 0925   PO2ART 59.0 (L) 09/01/2019 0925   HCO3 29.7 (H) 09/01/2019 0925   TCO2 31 09/01/2019 0925   ACIDBASEDEF 2.0 08/28/2019 1519   O2SAT 93.0 09/01/2019 0925     Coagulation Profile: Recent Labs  Lab 08/28/19 0406  INR 1.4*    Cardiac Enzymes: No results for input(s): CKTOTAL, CKMB, CKMBINDEX, TROPONINI in the last 168 hours.  HbA1C: Hgb A1c MFr Bld  Date/Time Value Ref Range Status  07/24/2019 04:50 AM 6.7 (H) 4.8 - 5.6 % Final    Comment:    (NOTE) Pre diabetes:          5.7%-6.4% Diabetes:              >6.4% Glycemic control for   <7.0% adults with diabetes   09/30/2017 05:36 AM 5.7 (H) 4.8 - 5.6 % Final    Comment:    (NOTE)         Prediabetes: 5.7 - 6.4         Diabetes: >6.4         Glycemic control for adults with diabetes: <7.0     CBG: Recent Labs  Lab 08/31/19 1917 08/31/19 2328 09/01/19 0316 09/01/19 0729 09/01/19 1131  GLUCAP 161* 152* 172* 160* 148*    CRITICAL CARE Performed by: Kipp Brood   Total critical care time: 50 minutes  Critical care time was exclusive of separately billable procedures and treating other  patients.  Critical care was necessary to treat or prevent imminent or life-threatening deterioration.  Critical care was time spent personally by me on the following activities: development of treatment plan with patient and/or surrogate as well as nursing, discussions with consultants, evaluation of patient's response to treatment, examination of patient, obtaining history from patient or surrogate, ordering and performing treatments and interventions, ordering and review of laboratory studies, ordering and review of radiographic studies, pulse oximetry, re-evaluation of patient's condition and participation in multidisciplinary rounds.  Kipp Brood, MD [facility] ICU Physician [place]  Pager: [phone] Mobile: [phone] After hours: [phone].     09/01/2019, 1:09 PM

## 2019-09-01 NOTE — Progress Notes (Signed)
RT NOTE: RT weaned nitric until completely off per Dr.Agarwala order. Patient tolerated well. RT will continue to monitor.

## 2019-09-01 NOTE — Progress Notes (Signed)
ANTICOAGULATION CONSULT NOTE - Follow Up Consult  Pharmacy Consult for heparin Indication: ECMO  Patient Measurements: Height: 5' 8"  (172.7 cm) Weight: (!) 328 lb 7.8 oz (149 kg) IBW/kg (Calculated) : 68.4 Heparin Dosing Weight: 104.5kg  Vital Signs: Temp: 97.5 F (36.4 C) (01/21 1600) Temp Source: Axillary (01/21 1600) Pulse Rate: 87 (01/21 1700)  Labs: Recent Labs    08/31/19 1150 08/31/19 1150 08/31/19 1630 08/31/19 1638 09/01/19 0414 09/01/19 0425 09/01/19 0925 09/01/19 0925 09/01/19 1623 09/01/19 1629  HGB 9.0*   < > 9.2*   < > 8.6*   < > 8.5*   < > 8.4* 8.8*  HCT 29.7*   < > 29.0*   < > 28.5*   < > 25.0*  --  28.1* 26.0*  PLT 116*   < > 116*  --  122*  --   --   --  124*  --   APTT  --   --  109*  --  149*  --   --   --  133*  --   HEPARINUNFRC  --   --  0.47  --  0.31  --   --   --  0.51  --   CREATININE 0.65  --  0.68  --  0.55*  --   --   --   --   --    < > = values in this interval not displayed.    Assessment: 44yo male on VV ECMO on heparin infusion. Heparin level slightly above goal at 0.51, although was at the lower end of goal 0.31 this AM with the same heparin rate. CBC stable.  1/24 2245 RN called and pt with small amount of bleeding from mouth. Since level was slightly elevated at 1700, will do small dose decrease.  Goal of Therapy:  Heparin level 0.3-0.5 units/ml   Plan:  Decrease heparin gtt to 2650 units/hr Monitor heparin level and CBC 5AM/5PM, daily LDH Monitor for any increase in bleeding from mouth  Sherlon Handing, PharmD, BCPS Please see amion for complete clinical pharmacist phone list 09/01/2019       10:44 PM  Please check [email or web address] for unit-specific pharmacist phone numbers

## 2019-09-01 NOTE — Progress Notes (Signed)
ANTICOAGULATION CONSULT NOTE - Follow Up Consult  Pharmacy Consult for heparin Indication: ECMO  Patient Measurements: Height: 5' 8"  (172.7 cm) Weight: (!) 328 lb 7.8 oz (149 kg) IBW/kg (Calculated) : 68.4 Heparin Dosing Weight: 104.5kg  Vital Signs: Temp: 97.5 F (36.4 C) (01/21 1600) Temp Source: Axillary (01/21 1600) Pulse Rate: 87 (01/21 1700)  Labs: Recent Labs    08/31/19 0404 08/31/19 0421 08/31/19 1150 08/31/19 1150 08/31/19 1630 08/31/19 1638 08/31/19 2235 08/31/19 2235 09/01/19 0414 09/01/19 0425  HGB 8.5*   < > 9.0*   < > 9.2*   < > 8.5*   < > 8.6* 8.8*  HCT 27.6*   < > 29.7*   < > 29.0*   < > 25.0*  --  28.5* 26.0*  PLT 108*   < > 116*  --  116*  --   --   --  122*  --   APTT 127*  --   --   --  109*  --   --   --  149*  --   HEPARINUNFRC 0.38  --   --   --  0.47  --   --   --  0.31  --   CREATININE 0.74   < > 0.65  --  0.68  --   --   --  0.55*  --    < > = values in this interval not displayed.    Assessment: 44yo male on VV ECMO on heparin infusion. Heparin therapeutic, CBC stable, LDH continues to trend down.  Goal of Therapy:  Heparin level 0.3-0.5 units/ml   Plan:  -Continue heparin gtt at 2750 units/hr  -Check 0500 and 1700 coags daily   Arrie Senate, PharmD, BCPS Clinical Pharmacist [phone] Please check AMION for all [facility] Pharmacy numbers 09/01/2019

## 2019-09-01 NOTE — Progress Notes (Signed)
Patient ID: [name], male   DOB: [birth year], 44 y.o.   MRN: [id number]    Advanced Heart Failure Rounding Note   Subjective:    Circuit changed and trach exchanged on 1/8.   Underwent addition of a RIJ drainage cannula on 1/20 Circuit changed on 1/22 for rising LDH  Remains on VV ecmo with good flows.   Seen by ENT yesterday and underwent nasal cautery with some improvement. Still with some epistaxis. Lasix stopped due to increasing NE requirement. NE now down.  TV staing 120-130 range.   ABG 7.49/41/59/93%  Co-ox 62%   CXR: Diffuse bilateral lung opacities improved particularly on right - mildly improved today Personally reviewed   On vent 60% (down from 80%) PEEP 15. PlatP 30, Bi-vent setting.TVs up to 120-130s Personally reviewed  hgb 8.5 LDH 480  ECMO parameters Flow 4.95 RPM 3620 dP 33 Pressure -89   Objective:   Weight Range:  Vital Signs:   Temp:  [96.8 F (36 C)-97.9 F (36.6 C)] 97 F (36.1 C) (01/24 0900) Pulse Rate:  [56-101] 75 (01/24 0945) Resp:  [0-30] 15 (01/24 0819) SpO2:  [81 %-100 %] 99 % (01/24 0945) Arterial Line BP: (106-181)/(39-77) 115/43 (01/24 0945) FiO2 (%):  [60 %-100 %] 60 % (01/24 0819) Weight:  [142.7 kg] 142.7 kg (01/24 0600) Last BM Date: 09/01/19(RT)  Weight change: Filed Weights   08/29/19 0500 08/30/19 0700 09/01/19 0600  Weight: (!) 149 kg (!) 145.3 kg (!) 142.7 kg    Intake/Output:   Intake/Output Summary (Last 24 hours) at 09/01/2019 1029 Last data filed at 09/01/2019 0900 Gross per 24 hour  Intake 4316.19 ml  Output 3155 ml  Net 1161.19 ml     Physical Exam: General: Sedated on vent. In standing position HEENT: normal jvp hard to see. Left nare packed. R mild epistaxis + oral cor-trak Neck: supple. JVP hard to see + trach  RIJ cannual ok Carotids 2+ bilat; no bruits. No lymphadenopathy or thryomegaly appreciated. Cor: PMI nondisplaced. Regular rate & rhythm. No rubs, gallops or murmurs. Lungs: minimal air  movement Abdomen: obese soft, nontender, nondistended. No hepatosplenomegaly. No bruits or masses. Good bowel sounds. Extremities: no cyanosis, clubbing, rash, 1+ edema femoral cannulas ok  Neuro: alert & orientedx3, cranial nerves grossly intact. moves all 4 extremities w/o difficulty. Affect pleasant    Telemetry: Sinus 70-80s Personally reviewed  Labs: Basic Metabolic Panel: Recent Labs  Lab 08/30/19 1958 08/30/19 1958 08/31/19 0404 08/31/19 0421 08/31/19 1150 08/31/19 1150 08/31/19 1630 08/31/19 1630 08/31/19 1638 08/31/19 2235 09/01/19 0414 09/01/19 0425 09/01/19 0925  NA 138   < > 143   < > 145   < > 146*   < > 146* 147* 145 146* 146*  K 3.2*   < > 3.6   < > 3.6   < > 4.4   < > 4.3 4.0 4.0 4.0 4.2  CL 99  --  102  --  104  --  107  --   --   --  106  --   --   CO2 27  --  29  --  29  --  28  --   --   --  28  --   --   GLUCOSE 156*  --  192*  --  191*  --  181*  --   --   --  177*  --   --   BUN 30*  --  33*  --  31*  --  31*  --   --   --  35*  --   --   CREATININE 0.73  --  0.74  --  0.65  --  0.68  --   --   --  0.55*  --   --   CALCIUM 8.7*   < > 8.6*   < > 8.6*  --  8.8*  --   --   --  9.0  --   --   MG  --   --  1.5*  --  2.3  --   --   --   --   --  2.3  --   --   PHOS  --   --   --   --   --   --   --   --   --   --  5.4*  --   --    < > = values in this interval not displayed.    Liver Function Tests: Recent Labs  Lab 08/26/19 0320 08/31/19 1150  AST 43* 24  ALT 65* 26  ALKPHOS 68 75  BILITOT 1.0 0.7  PROT 6.3* 6.2*  ALBUMIN 3.4* 2.2*   No results for input(s): LIPASE, AMYLASE in the last 168 hours. No results for input(s): AMMONIA in the last 168 hours.  CBC: Recent Labs  Lab 08/26/19 0306 08/26/19 0458 08/27/19 0448 08/27/19 0456 08/28/19 0406 08/28/19 0418 08/29/19 0355 08/29/19 0403 08/30/19 0427 08/30/19 0500 08/30/19 1558 08/30/19 1613 08/31/19 0404 08/31/19 0421 08/31/19 1150 08/31/19 1150 08/31/19 1630 08/31/19 1630  08/31/19 1638 08/31/19 2235 09/01/19 0414 09/01/19 0425 09/01/19 0925  WBC 5.2   < > 6.4   < > 8.5   < > 12.5*   < > 11.4*   < > 12.3*  --  9.7  --  8.8  --  8.9  --   --   --  9.1  --   --   NEUTROABS 2.1  --  2.4  --  3.6  --  5.5  --  5.5  --   --   --   --   --   --   --   --   --   --   --   --   --   --   HGB 8.1*   < > 8.4*   < > 8.6*   < > 9.3*   < > 9.9*   < > 9.2*   < > 8.5*   < > 9.0*   < > 9.2*   < > 9.2* 8.5* 8.6* 8.8* 8.5*  HCT 27.5*   < > 27.5*   < > 28.7*   < > 31.0*   < > 31.8*   < > 29.5*   < > 27.6*   < > 29.7*   < > 29.0*   < > 27.0* 25.0* 28.5* 26.0* 25.0*  MCV 97.5   < > 95.8   < > 95.7   < > 93.7   < > 92.4   < > 91.6  --  92.0  --  93.4  --  92.4  --   --   --  94.4  --   --   PLT 92*   < > 92*   < > 101*   < > 108*   < > 109*   < > 111*  --  108*  --  116*  --  116*  --   --   --  122*  --   --    < > = values in this interval not displayed.    Cardiac Enzymes: No results for input(s): CKTOTAL, CKMB, CKMBINDEX, TROPONINI in the last 168 hours.  BNP: BNP (last 3 results) Recent Labs    07/22/19 1039  BNP 15.3    ProBNP (last 3 results) No results for input(s): PROBNP in the last 8760 hours.    Other results:  Imaging: DG CHEST PORT 1 VIEW  Result Date: 09/01/2019 CLINICAL DATA:  COVID-19.  ECMO.  Chest tube EXAM: PORTABLE CHEST 1 VIEW COMPARISON:  08/31/2019 FINDINGS: Tracheostomy in good position. Feeding tube enters the stomach with the tip not visualized. Right jugular large-bore catheter extending into the lower SVC. Inferior vena cava large bore catheter extending into the cavoatrial junction. Bilateral chest tubes. No pneumothorax. Severe bilateral airspace disease. Slight improved lung volumes. IMPRESSION: Severe bilateral airspace disease with slight improvement in lung volumes. Support lines unchanged.  No pneumothorax. Electronically Signed   By: Franchot Gallo M.D.   On: 09/01/2019 07:26   DG CHEST PORT 1 VIEW  Result Date:  08/31/2019 CLINICAL DATA:  ECMO. EXAM: PORTABLE CHEST 1 VIEW COMPARISON:  08/30/2019 FINDINGS: 0630 hours. Low lung volumes. The cardio pericardial silhouette is enlarged. Diffuse bilateral airspace disease again noted, progressive in the right lower lung. Tracheostomy tube remains in place. Right IJ sheath again noted. Right PICC line tip overlies the expected location of the distal SVC. A feeding tube passes into the stomach although the distal tip position is not included on the film. Left subclavian central line tip is overlying the innominate vein confluence. Inferiorly placed cannula tip overlies the region of the IVC/RA junction. Bilateral chest tubes remain in place. IMPRESSION: 1. Progression of airspace disease in the right base on a background of diffuse bilateral airspace opacity. 2. Support apparatus stable, as above. Electronically Signed   By: Misty Stanley M.D.   On: 08/31/2019 10:47   DG CHEST PORT 1 VIEW  Result Date: 08/30/2019 CLINICAL DATA:  Chest tube EXAM: PORTABLE CHEST 1 VIEW COMPARISON:  08/30/2019, 08/28/2019, 08/27/2019, 08/26/2019 FINDINGS: Tip of the tracheal tube is about 3.4 cm superior to the carina. Esophageal tube tip below the diaphragm but incompletely included. Bilateral chest tubes remain in place. Large bore catheter on the right, from superior approach, the tip overlies the proximal right atrium. From inferior approach, the tip overlies the expected location of IVC right atrial junction. Left-sided central venous catheter tip over the SVC. Low lung volumes. There is slightly improved aeration right greater than left. Fairly extensive bilateral lung consolidations remain. Probable small effusions. The cardiomediastinal silhouette is obscured. IMPRESSION: 1. Support lines and tubes as detailed above. 2. Low lung volumes with slightly improved aeration bilaterally. Fairly extensive residual left greater than right pulmonary airspace disease. Electronically Signed   By: Donavan Foil M.D.   On: 08/30/2019 20:45     Medications:     Scheduled Medications: . artificial tears  1 application Both Eyes W2B  . vitamin C  500 mg Per Tube Daily  . chlorhexidine gluconate (MEDLINE KIT)  15 mL Mouth Rinse BID  . Chlorhexidine Gluconate Cloth  6 each Topical Daily  . cloNIDine  0.1 mg Per Tube BID  . diazepam  20 mg Per Tube Q6H  . feeding supplement (PRO-STAT SUGAR FREE 64)  60 mL Per Tube BID  . gabapentin  450 mg Per Tube  Q8H  . HYDROmorphone  8 mg Per Tube Q4H  . insulin aspart  3-9 Units Subcutaneous Q4H  . ipratropium-albuterol  3 mL Nebulization Q4H  . mouth rinse  15 mL Mouth Rinse 10 times per day  . montelukast  10 mg Per Tube QHS  . oxymetazoline  1 spray Each Nare BID  . pantoprazole sodium  40 mg Per Tube QHS  . sodium chloride flush  10-40 mL Intracatheter Q12H  . sodium chloride flush  10-40 mL Intracatheter Q12H  . Thrombi-Pad  1 each Topical Once  . zinc sulfate  220 mg Per Tube Daily    Infusions: . sodium chloride    . sodium chloride 10 mL/hr at 08/29/19 1714  . sodium chloride Stopped (08/30/19 1638)  . albumin human 12.5 g (08/31/19 1623)  . cefTRIAXone (ROCEPHIN)  IV 2 g (09/01/19 0906)  . dexmedetomidine (PRECEDEX) IV infusion 1.6 mcg/kg/hr (09/01/19 0900)  . dextrose    . feeding supplement (PIVOT 1.5 CAL) 75 mL/hr at 09/01/19 0700  . heparin 2,750 Units/hr (09/01/19 0900)  . HYDROmorphone 20 mg/hr (09/01/19 0902)  . midazolam (VERSED) infusion 9 mg/hr (09/01/19 0900)  . norepinephrine 2 mcg/min (09/01/19 0900)  . propofol (DIPRIVAN) infusion Stopped (08/31/19 0910)    PRN Medications: Place/Maintain arterial line **AND** sodium chloride, sodium chloride, sodium chloride, acetaminophen (TYLENOL) oral liquid 160 mg/5 mL, acetaminophen, albumin human, dextrose, hydrALAZINE, HYDROmorphone, magnesium hydroxide, metoprolol tartrate, midazolam, ondansetron (ZOFRAN) IV, senna-docusate, sodium chloride, sodium chloride flush, sodium  chloride flush, sodium chloride flush   Assessment/Plan:    1. Acute hypoxic/hypercarbic respiratory failure due to COVID PNA: Remained markedly hypoxic despite full support. Intubated 12/26. S/p bilateral CTs 12/26 for pneumothoraces.  TEE on 12/26 LVEF 70% RV ok. VV ECMO begun 12/26. Tracheostomy 12/29.  COVID ARDS at this point. ECMO circuit changed 1/8 & 1/23 - ECMO parameters stable. Of paralytics and toelrating - RIJ venous drainage cannula added 1/20. Circuit changed 1/23 - Flows look good.  - Lung compliance, TVs and oxygenation also beginning to improve. Hopefully will continue with lung recovery.  - Still volume overload. Now off lasix gtt. Give lasix 20IV today - Continue to bronch as needed for nasal bleeding into lungs. - LDH 480 -> 412 with circuit change. Watch closely - Continue sedation. Off nimbex. Can use propofol for breakthrough  2. Bilateral PTX: Due to barotrauma.  - Driving pressure minimized at < 15 Stable. No change  3. COVID PNA: management of COVID infection per CCM. CXR with bilateral multifocal PNA progressing to likely ARDS.  - s/p 10 days of remdesivir - 12/16, 12/2 s/p tociluzimab - 12/17 s/p convalescent plasma - Decadron at 54m/daily completed 1/11 - Completed cefepime/vanc - On ceftriaxone for nasal packing  4. ID: Empiric coverage with vancomycin/cefepime initially for possible secondary bacterial PNA, course completed.   - treated with cefepime and vanc for + cx with staph epidimidis, MSSA and serratia (sensitivities ok). - Cover with ceftriaxone for nasal packing - Remains stable - Discussed dosing with PharmD personally.  5. Thrombocytopenia: Slow decrease in platelets, likely low level hemolysis + critical illness.  - Stable 122k this am.  - On hepain. Treatment of epistaxis as abive  6. Anemia: Bleeding at trach site resolved but still with epistaxis - hgb 8.8   today - Transfuse hgb < 8  7. FEN - continue TFs via oral  Cor-trak  D/w with ECMO team (ECMO coordinator/specialists, CCM, TCTS and pharmD) at bedside on multidisciplinary rounds. Continue to  await ARDS recovery. Details as above.   Length of Stay: [place] Performed by: Glori Bickers  Total critical care time: 40 minutes  Critical care time was exclusive of separately billable procedures and treating other patients.  Critical care was necessary to treat or prevent imminent or life-threatening deterioration.  Critical care was time spent personally by me (independent of midlevel providers or residents) on the following activities: development of treatment plan with patient and/or surrogate as well as nursing, discussions with consultants, evaluation of patient's response to treatment, examination of patient, obtaining history from patient or surrogate, ordering and performing treatments and interventions, ordering and review of laboratory studies, ordering and review of radiographic studies, pulse oximetry and re-evaluation of patient's condition.    Glori Bickers MD 09/01/2019, 10:29 AM  Advanced Heart Failure Team Pager [phone] (M-F; 7a - 4p)  Please contact [facility] for night-coverage after hours (4p -7a ) and weekends on [email or web address]

## 2019-09-01 NOTE — Progress Notes (Signed)
ANTICOAGULATION CONSULT NOTE - Follow Up Consult  Pharmacy Consult for heparin Indication: ECMO  Patient Measurements: Height: 5' 8"  (172.7 cm) Weight: (!) 328 lb 7.8 oz (149 kg) IBW/kg (Calculated) : 68.4 Heparin Dosing Weight: 104.5kg  Vital Signs: Temp: 97.5 F (36.4 C) (01/21 1600) Temp Source: Axillary (01/21 1600) Pulse Rate: 87 (01/21 1700)  Labs: Recent Labs    08/31/19 0404 08/31/19 0421 08/31/19 1150 08/31/19 1150 08/31/19 1630 08/31/19 1638 09/01/19 0414 09/01/19 0414 09/01/19 0425 09/01/19 0425 09/01/19 0925 09/01/19 1623  HGB 8.5*   < > 9.0*   < > 9.2*   < > 8.6*   < > 8.8*   < > 8.5* 8.4*  HCT 27.6*   < > 29.7*   < > 29.0*   < > 28.5*   < > 26.0*  --  25.0* 28.1*  PLT 108*   < > 116*   < > 116*  --  122*  --   --   --   --  124*  APTT 127*  --   --   --  109*  --  149*  --   --   --   --   --   HEPARINUNFRC 0.38   < >  --   --  0.47  --  0.31  --   --   --   --  0.51  CREATININE 0.74   < > 0.65  --  0.68  --  0.55*  --   --   --   --   --    < > = values in this interval not displayed.    Assessment: 44yo male on VV ECMO on heparin infusion. Heparin level slightly above goal at 0.51, although was at the lower end of goal 0.31 this AM with the same heparin rate. CBC stable  Goal of Therapy:  Heparin level 0.3-0.5 units/ml   Plan:  Continue heparin gtt at 2750 units/hr - hesitant to make a permanent change in rate given so close to therapeutic range and fluctuations toward lower and upper end of goal at this current heparin rate. If heparin level increases further in AM or pt has increased bleeding, may need to back down rate slightly. Monitor heparin level and CBC 5AM/5PM, daily LDH Monitor for signs/symptoms of bleeding   Vertis Kelch, PharmD, [facility] PGY2 Cardiology Pharmacy Resident Phone [phone] 09/01/2019       5:25 PM  Please check [email or web address] for unit-specific pharmacist phone numbers

## 2019-09-02 ENCOUNTER — Inpatient Hospital Stay (HOSPITAL_COMMUNITY): Payer: Managed Care, Other (non HMO)

## 2019-09-02 LAB — GLUCOSE, CAPILLARY
Glucose-Capillary: 141 mg/dL — ABNORMAL HIGH (ref 70–99)
Glucose-Capillary: 146 mg/dL — ABNORMAL HIGH (ref 70–99)
Glucose-Capillary: 159 mg/dL — ABNORMAL HIGH (ref 70–99)
Glucose-Capillary: 159 mg/dL — ABNORMAL HIGH (ref 70–99)
Glucose-Capillary: 150 mg/dL — ABNORMAL HIGH (ref 70–99)
Glucose-Capillary: 152 mg/dL — ABNORMAL HIGH (ref 70–99)

## 2019-09-02 LAB — BPAM RBC
Blood Product Expiration Date: 202102122359
Blood Product Expiration Date: 202102232359
Blood Product Expiration Date: 202102232359
Blood Product Expiration Date: 202102232359
ISSUE DATE / TIME: 202101230848
ISSUE DATE / TIME: 202101231028
ISSUE DATE / TIME: 202101231028
Unit Type and Rh: 5100
Unit Type and Rh: 5100
Unit Type and Rh: 5100
Unit Type and Rh: 5100

## 2019-09-02 LAB — POCT I-STAT 7, (LYTES, BLD GAS, ICA,H+H)
Acid-Base Excess: 5 mmol/L — ABNORMAL HIGH (ref 0.0–2.0)
Acid-Base Excess: 5 mmol/L — ABNORMAL HIGH (ref 0.0–2.0)
Acid-Base Excess: 5 mmol/L — ABNORMAL HIGH (ref 0.0–2.0)
Acid-Base Excess: 4 mmol/L — ABNORMAL HIGH (ref 0.0–2.0)
Acid-Base Excess: 5 mmol/L — ABNORMAL HIGH (ref 0.0–2.0)
Bicarbonate: 29.5 mmol/L — ABNORMAL HIGH (ref 20.0–28.0)
Bicarbonate: 30.6 mmol/L — ABNORMAL HIGH (ref 20.0–28.0)
Bicarbonate: 30.1 mmol/L — ABNORMAL HIGH (ref 20.0–28.0)
Bicarbonate: 29.1 mmol/L — ABNORMAL HIGH (ref 20.0–28.0)
Bicarbonate: 30.2 mmol/L — ABNORMAL HIGH (ref 20.0–28.0)
Calcium, Ion: 1.26 mmol/L (ref 1.15–1.40)
Calcium, Ion: 1.24 mmol/L (ref 1.15–1.40)
Calcium, Ion: 1.27 mmol/L (ref 1.15–1.40)
Calcium, Ion: 1.28 mmol/L (ref 1.15–1.40)
Calcium, Ion: 1.26 mmol/L (ref 1.15–1.40)
HCT: 23 % — ABNORMAL LOW (ref 39.0–52.0)
HCT: 28 % — ABNORMAL LOW (ref 39.0–52.0)
HCT: 26 % — ABNORMAL LOW (ref 39.0–52.0)
HCT: 27 % — ABNORMAL LOW (ref 39.0–52.0)
HCT: 27 % — ABNORMAL LOW (ref 39.0–52.0)
Hemoglobin: 7.8 g/dL — ABNORMAL LOW (ref 13.0–17.0)
Hemoglobin: 9.5 g/dL — ABNORMAL LOW (ref 13.0–17.0)
Hemoglobin: 8.8 g/dL — ABNORMAL LOW (ref 13.0–17.0)
Hemoglobin: 9.2 g/dL — ABNORMAL LOW (ref 13.0–17.0)
Hemoglobin: 9.2 g/dL — ABNORMAL LOW (ref 13.0–17.0)
O2 Saturation: 93 %
O2 Saturation: 86 %
O2 Saturation: 86 %
O2 Saturation: 90 %
O2 Saturation: 85 %
Patient temperature: 98
Patient temperature: 98
Patient temperature: 36.8
Patient temperature: 36.6
Patient temperature: 98
Potassium: 4.3 mmol/L (ref 3.5–5.1)
Potassium: 4.8 mmol/L (ref 3.5–5.1)
Potassium: 5 mmol/L (ref 3.5–5.1)
Potassium: 3.9 mmol/L (ref 3.5–5.1)
Potassium: 4 mmol/L (ref 3.5–5.1)
Sodium: 146 mmol/L — ABNORMAL HIGH (ref 135–145)
Sodium: 145 mmol/L (ref 135–145)
Sodium: 144 mmol/L (ref 135–145)
Sodium: 145 mmol/L (ref 135–145)
Sodium: 146 mmol/L — ABNORMAL HIGH (ref 135–145)
TCO2: 31 mmol/L (ref 22–32)
TCO2: 32 mmol/L (ref 22–32)
TCO2: 32 mmol/L (ref 22–32)
TCO2: 30 mmol/L (ref 22–32)
TCO2: 32 mmol/L (ref 22–32)
pCO2 arterial: 40.9 mmHg (ref 32.0–48.0)
pCO2 arterial: 47.6 mmHg (ref 32.0–48.0)
pCO2 arterial: 47.1 mmHg (ref 32.0–48.0)
pCO2 arterial: 45.1 mmHg (ref 32.0–48.0)
pCO2 arterial: 44.8 mmHg (ref 32.0–48.0)
pH, Arterial: 7.465 — ABNORMAL HIGH (ref 7.350–7.450)
pH, Arterial: 7.415 (ref 7.350–7.450)
pH, Arterial: 7.413 (ref 7.350–7.450)
pH, Arterial: 7.416 (ref 7.350–7.450)
pH, Arterial: 7.436 (ref 7.350–7.450)
pO2, Arterial: 61 mmHg — ABNORMAL LOW (ref 83.0–108.0)
pO2, Arterial: 51 mmHg — ABNORMAL LOW (ref 83.0–108.0)
pO2, Arterial: 51 mmHg — ABNORMAL LOW (ref 83.0–108.0)
pO2, Arterial: 56 mmHg — ABNORMAL LOW (ref 83.0–108.0)
pO2, Arterial: 48 mmHg — ABNORMAL LOW (ref 83.0–108.0)

## 2019-09-02 LAB — TYPE AND SCREEN
ABO/RH(D): O POS
Antibody Screen: NEGATIVE
Unit division: 0
Unit division: 0
Unit division: 0
Unit division: 0

## 2019-09-02 LAB — COOXEMETRY PANEL
Carboxyhemoglobin: 2.9 % — ABNORMAL HIGH (ref 0.5–1.5)
Carboxyhemoglobin: 2.9 % — ABNORMAL HIGH (ref 0.5–1.5)
Methemoglobin: 0.6 % (ref 0.0–1.5)
Methemoglobin: 0.6 % (ref 0.0–1.5)
O2 Saturation: 63.1 %
O2 Saturation: 74.5 %
Total hemoglobin: 6.4 g/dL — CL (ref 12.0–16.0)
Total hemoglobin: 9.4 g/dL — ABNORMAL LOW (ref 12.0–16.0)

## 2019-09-02 LAB — CBC
HCT: 29.2 % — ABNORMAL LOW (ref 39.0–52.0)
HCT: 29.3 % — ABNORMAL LOW (ref 39.0–52.0)
Hemoglobin: 8.5 g/dL — ABNORMAL LOW (ref 13.0–17.0)
Hemoglobin: 8.8 g/dL — ABNORMAL LOW (ref 13.0–17.0)
MCH: 28.4 pg (ref 26.0–34.0)
MCH: 28.9 pg (ref 26.0–34.0)
MCHC: 29.1 g/dL — ABNORMAL LOW (ref 30.0–36.0)
MCHC: 30 g/dL (ref 30.0–36.0)
MCV: 97.7 fL (ref 80.0–100.0)
MCV: 96.4 fL (ref 80.0–100.0)
Platelets: 128 10*3/uL — ABNORMAL LOW (ref 150–400)
Platelets: 144 10*3/uL — ABNORMAL LOW (ref 150–400)
RBC: 2.99 MIL/uL — ABNORMAL LOW (ref 4.22–5.81)
RBC: 3.04 MIL/uL — ABNORMAL LOW (ref 4.22–5.81)
RDW: 18.5 % — ABNORMAL HIGH (ref 11.5–15.5)
RDW: 18.5 % — ABNORMAL HIGH (ref 11.5–15.5)
WBC: 10.2 10*3/uL (ref 4.0–10.5)
WBC: 10.7 10*3/uL — ABNORMAL HIGH (ref 4.0–10.5)
nRBC: 0.7 % — ABNORMAL HIGH (ref 0.0–0.2)
nRBC: 0.6 % — ABNORMAL HIGH (ref 0.0–0.2)

## 2019-09-02 LAB — LACTIC ACID, PLASMA: Lactic Acid, Venous: 1.4 mmol/L (ref 0.5–1.9)

## 2019-09-02 LAB — HEPARIN LEVEL (UNFRACTIONATED)
Heparin Unfractionated: 0.41 IU/mL (ref 0.30–0.70)
Heparin Unfractionated: 0.36 IU/mL (ref 0.30–0.70)

## 2019-09-02 LAB — BASIC METABOLIC PANEL
Anion gap: 11 (ref 5–15)
BUN: 37 mg/dL — ABNORMAL HIGH (ref 6–20)
CO2: 28 mmol/L (ref 22–32)
Calcium: 9.3 mg/dL (ref 8.9–10.3)
Chloride: 106 mmol/L (ref 98–111)
Creatinine, Ser: 0.64 mg/dL (ref 0.61–1.24)
GFR calc Af Amer: >60 mL/min (ref >60)
GFR calc non Af Amer: >60 mL/min (ref >60)
Glucose, Bld: 160 mg/dL — ABNORMAL HIGH (ref 70–99)
Potassium: 4.7 mmol/L (ref 3.5–5.1)
Sodium: 145 mmol/L (ref 135–145)

## 2019-09-02 LAB — APTT
aPTT: 132 seconds — ABNORMAL HIGH (ref 24–36)
aPTT: 108 seconds — ABNORMAL HIGH (ref 24–36)

## 2019-09-02 LAB — LACTATE DEHYDROGENASE: LDH: 445 U/L — ABNORMAL HIGH (ref 98–192)

## 2019-09-02 MED ORDER — FUROSEMIDE 10 MG/ML IJ SOLN
8.0000 mg/h | INTRAVENOUS | Status: DC
  Administered 2019-09-02: 4 mg/h via INTRAVENOUS
  Administered 2019-09-03: 8 mg/h via INTRAVENOUS
  Administered 2019-09-05 – 2019-09-06 (×2): 11 mg/h via INTRAVENOUS
  Filled 2019-09-02 (×5): qty 25

## 2019-09-02 MED ORDER — PROPRANOLOL HCL 20 MG/5ML PO SOLN
20.0000 mg | Freq: Three times a day (TID) | ORAL | Status: DC
  Administered 2019-09-02 – 2019-09-07 (×14): 20 mg
  Filled 2019-09-02 (×20): qty 5

## 2019-09-02 MED ORDER — PROPRANOLOL HCL 20 MG/5ML PO SOLN
20.0000 mg | Freq: Three times a day (TID) | ORAL | Status: DC
  Filled 2019-09-02: qty 5

## 2019-09-02 NOTE — Progress Notes (Signed)
Nutrition Follow up  DOCUMENTATION CODES:   Obesity unspecified  INTERVENTION:   Continue tube feeding:  -Pivot 1.5 @ 75 ml/hr via oral Cortrak (1800 ml)  -60 ml Prostat BID -Free water flushes- d/c by CCM  At goal rate TF provides: 3100 kcals, 229 grams protein, 1350 ml free water. Meets 100% of needs.   NUTRITION DIAGNOSIS:   Increased nutrient needs related to acute illness(COVID PNA) as evidenced by estimated needs.  Ongoing  GOAL:   Patient will meet greater than or equal to 90% of their needs   Addressed via TF  MONITOR:   Diet advancement, Skin, TF tolerance, Weight trends, Labs, I & O's  REASON FOR ASSESSMENT:   Consult Enteral/tube feeding initiation and management  ASSESSMENT:   Patient with PMH significant for GERD, HTN, and asthma. Presents this admission with COVID-19 PNA.   12/14- admit 12/23- chest xray revealed pneumomediastinum  12/24- developed bilateral pneumothoraces  12/26- worsening hypoxemia, intubated  12/27- s/p VV fem/fem ECMO cannulation  12/29- s/p trach  1/5- nasal bleeding- packing per CCM 1/7- nasal packing per ENT 1/8- trach exchanged, ECMO circuit changed  1/20- s/p additional RIJ drainage cannula  1/21- Cortrak kinked, replaced by OGT, pt vomited over night 1/22- ECMO circuit exchanged  1/23- nasal endoscopy, control of epistaxis   Pt discussed during ICU rounds and with RN.   VV ECMO day 30. Oxygenation improving. Plan broch again today. Off paralytic. Attempting to wean sedation (hypertensive and agitated last night). Continues to be volume overloaded-lasix restarted. Oral post-pyloric Cortrak tube placed on 1/22 (TF restarted). Pt tolerating tube feeding at goal rate.   Admission weight: 136.1 kg  Current weight: 147 kg kg (down 5 kg over the last week)  Patient requiring ventilator support via trach  MV: 1.8 L/min Temp (24hrs), Avg:97.3 F (36.3 C), Min:97 F (36.1 C), Max:98 F (36.7 C)   I/O: +16,195 ml since  1/11 UOP: 3,175 ml x 24 hrs Chest tubes: 130 ml x 24 hrs  Stool: 75 ml x 24 hrs   Drips: precedex, 250 mg lasix in D5 @ 4 ml/hr, versed Medications: 500 mg Vitamin C daily, SS novolog, 220 mg zinc sulfate daily  Labs: CBG 146-192  Diet Order:   Diet Order            Diet NPO time specified  Diet effective midnight              EDUCATION NEEDS:   Not appropriate for education at this time  Skin:  Skin Assessment: Skin Integrity Issues: Skin Integrity Issues:: Other (Comment), Incisions Stage II: n/a Incisions: L wrist, nose Other: L arm blister, MASD-anus, groin  Last BM:  1/25- rectal tube  Height:   Ht Readings from Last 1 Encounters:  08/09/19 5' 8"  (1.727 m)    Weight:   Wt Readings from Last 1 Encounters:  09/02/19 (!) 147 kg    Ideal Body Weight:  70 kg  BMI:  Body mass index is 49.28 kg/m.  Estimated Nutritional Needs:   Kcal:  0017-4944 kcal (25-30 kcal/kg)  Protein:  180-215 grams  Fluid:  >/= 2.5 L/day   [name] RD, LDN Clinical Nutrition Pager # - [phone]

## 2019-09-02 NOTE — Progress Notes (Signed)
2 Days Post-Op Procedure(s) (LRB): NASAL ENDOSCOPY WITH EPISTAXIS CONTROL WITH CAUTERIZATION (N/A) Subjective: Now off paralytics, with slight movements FiO2 at .60 Cannula sites clean, dry Nasal guaze dry Objective: Vital signs in last 24 hours: Temp:  [97 F (36.1 C)-98 F (36.7 C)] 98 F (36.7 C) (01/25 0000) Pulse Rate:  [59-108] 87 (01/25 1100) Cardiac Rhythm: Normal sinus rhythm (01/25 0800) Resp:  [0-27] 17 (01/25 1100) BP: (165)/(62) 165/62 (01/25 0828) SpO2:  [88 %-100 %] 88 % (01/25 1129) Arterial Line BP: (101-186)/(39-63) 142/46 (01/25 1100) FiO2 (%):  [60 %-100 %] 60 % (01/25 1130) Weight:  [147 kg] 147 kg (01/25 0100)  Hemodynamic parameters for last 24 hours: CVP:  [6 mmHg-16 mmHg] 11 mmHg  Intake/Output from previous day: 01/24 0701 - 01/25 0700 In: 4712 [I.V.:2562; NG/GT:1800; IV Piggyback:350] Out: 3380 [Urine:3175; Stool:75; Chest Tube:130] Intake/Output this shift: Total I/O In: 876.9 [I.V.:476.9; NG/GT:300; IV Piggyback:100] Out: 925 [Urine:925]  General edema Distant breath sounds- TV 100-150 cc Abd soft Lab Results: Recent Labs    09/01/19 1623 09/01/19 1629 09/02/19 0443 09/02/19 0627  WBC 8.3  --  10.2  --   HGB 8.4*   < > 8.5* 9.5*  HCT 28.1*   < > 29.2* 28.0*  PLT 124*  --  128*  --    < > = values in this interval not displayed.   BMET:  Recent Labs    09/01/19 0414 09/01/19 0425 09/02/19 0443 09/02/19 0627  NA 145   < > 145 145  K 4.0   < > 4.7 4.8  CL 106  --  106  --   CO2 28  --  28  --   GLUCOSE 177*  --  160*  --   BUN 35*  --  37*  --   CREATININE 0.55*  --  0.64  --   CALCIUM 9.0  --  9.3  --    < > = values in this interval not displayed.    PT/INR: No results for input(s): LABPROT, INR in the last 72 hours. ABG    Component Value Date/Time   PHART 7.415 09/02/2019 0627   HCO3 30.6 (H) 09/02/2019 0627   TCO2 32 09/02/2019 0627   ACIDBASEDEF 2.0 08/28/2019 1519   O2SAT 86.0 09/02/2019 0627   CBG (last 3)   Recent Labs    09/01/19 2346 09/02/19 0430 09/02/19 0738  GLUCAP 141* 146* 159*    Assessment/Plan: S/P Procedure(s) (LRB): NASAL ENDOSCOPY WITH EPISTAXIS CONTROL WITH CAUTERIZATION (N/A) VV ECMO - circuit change Jan-23, LDH 440 ECMO blood flow 4.8- 5.0 L with two balanced drainage cannulas  LOS: 42 days    [name] 09/02/2019

## 2019-09-02 NOTE — Progress Notes (Signed)
Patient ID: [name], male   DOB: April 11, 1976, 44 y.o.   MRN: [id number]    Advanced Heart Failure Rounding Note   Subjective:    Circuit changed and trach exchanged on 1/8.   Underwent addition of a RIJ drainage cannula on 1/20 Circuit changed on 1/22 for rising LDH  Remains on VV ecmo with good flows.   Woke up last night and became severely hypertensive and dysynchronous with vent. Sats dropped into low 80s. Given metoprolol and sedated further.  Now sats in low-mid 90s. BP still high.   ABG 7.41/48/51/86%  Co-ox 63%   CXR: Diffuse bilateral lung opacities - worse today. Personally reviewed   On vent 70% (up from 60%%) PEEP 15. PlatP 30, Bi-vent setting.TVs up to low 200ss Personally reviewed  hgb 8.5 LDH 480 -> 445  ECMO parameters Flow 4.91 RPM 3675 dP 35 Pressure -72   Objective:   Weight Range:  Vital Signs:   Temp:  [97 F (36.1 C)-98 F (36.7 C)] 98 F (36.7 C) (01/25 0000) Pulse Rate:  [59-107] 103 (01/25 0700) Resp:  [0-27] 21 (01/25 0700) SpO2:  [89 %-100 %] 92 % (01/25 0700) Arterial Line BP: (101-181)/(34-63) 175/59 (01/25 0700) FiO2 (%):  [60 %-100 %] 80 % (01/25 0358) Weight:  [147 kg] 147 kg (01/25 0100) Last BM Date: 09/01/19  Weight change: Filed Weights   08/30/19 0700 09/01/19 0600 09/02/19 0100  Weight: (!) 145.3 kg (!) 142.7 kg (!) 147 kg    Intake/Output:   Intake/Output Summary (Last 24 hours) at 09/02/2019 0807 Last data filed at 09/02/2019 0700 Gross per 24 hour  Intake 4528.28 ml  Output 3250 ml  Net 1278.28 ml     Physical Exam: General: Sedated on vent. Bed inclined HEENT: normal + nasal packing + oozing Neck: supple. RIJ cannula ok. + trach Carotids 2+ bilat; no bruits. No lymphadenopathy or thryomegaly appreciated. Cor: PMI nondisplaced. Regular tachy No rubs, gallops or murmurs. Lungs: minimal air movement Abdomen: obese soft, nontender, nondistended. No hepatosplenomegaly. No bruits or masses. Good bowel  sounds. Extremities: no cyanosis, clubbing, rash, 1+ edema  + femoral cannulas Neuro: sedated      Telemetry: Sinus 110s Personally reviewed  Labs: Basic Metabolic Panel: Recent Labs  Lab 08/31/19 0404 08/31/19 0421 08/31/19 1150 08/31/19 1150 08/31/19 1630 08/31/19 1638 09/01/19 0414 09/01/19 0425 09/01/19 2305 09/01/19 2348 09/02/19 0112 09/02/19 0443 09/02/19 0627  NA 143   < > 145   < > 146*   < > 145   < > 147* 146* 146* 145 145  K 3.6   < > 3.6   < > 4.4   < > 4.0   < > 4.2 4.3 4.3 4.7 4.8  CL 102  --  104  --  107  --  106  --   --   --   --  106  --   CO2 29  --  29  --  28  --  28  --   --   --   --  28  --   GLUCOSE 192*  --  191*  --  181*  --  177*  --   --   --   --  160*  --   BUN 33*  --  31*  --  31*  --  35*  --   --   --   --  37*  --   CREATININE 0.74  --  0.65  --  0.68  --  0.55*  --   --   --   --  0.64  --   CALCIUM 8.6*   < > 8.6*   < > 8.8*  --  9.0  --   --   --   --  9.3  --   MG 1.5*  --  2.3  --   --   --  2.3  --   --   --   --   --   --   PHOS  --   --   --   --   --   --  5.4*  --   --   --   --   --   --    < > = values in this interval not displayed.    Liver Function Tests: Recent Labs  Lab 08/31/19 1150  AST 24  ALT 26  ALKPHOS 75  BILITOT 0.7  PROT 6.2*  ALBUMIN 2.2*   No results for input(s): LIPASE, AMYLASE in the last 168 hours. No results for input(s): AMMONIA in the last 168 hours.  CBC: Recent Labs  Lab 08/27/19 0448 08/27/19 0456 08/28/19 0406 08/28/19 0418 08/29/19 0355 08/29/19 0403 08/30/19 0427 08/30/19 0500 08/31/19 1150 08/31/19 1150 08/31/19 1630 08/31/19 1638 09/01/19 0414 09/01/19 0425 09/01/19 1623 09/01/19 1629 09/01/19 2305 09/01/19 2348 09/02/19 0112 09/02/19 0443 09/02/19 0627  WBC 6.4   < > 8.5   < > 12.5*   < > 11.4*   < > 8.8  --  8.9  --  9.1  --  8.3  --   --   --   --  10.2  --   NEUTROABS 2.4  --  3.6  --  5.5  --  5.5  --   --   --   --   --   --   --   --   --   --   --    --   --   --   HGB 8.4*   < > 8.6*   < > 9.3*   < > 9.9*   < > 9.0*   < > 9.2*   < > 8.6*   < > 8.4*   < > 8.8* 8.8* 7.8* 8.5* 9.5*  HCT 27.5*   < > 28.7*   < > 31.0*   < > 31.8*   < > 29.7*   < > 29.0*   < > 28.5*   < > 28.1*   < > 26.0* 26.0* 23.0* 29.2* 28.0*  MCV 95.8   < > 95.7   < > 93.7   < > 92.4   < > 93.4  --  92.4  --  94.4  --  95.6  --   --   --   --  97.7  --   PLT 92*   < > 101*   < > 108*   < > 109*   < > 116*  --  116*  --  122*  --  124*  --   --   --   --  128*  --    < > = values in this interval not displayed.    Cardiac Enzymes: No results for input(s): CKTOTAL, CKMB, CKMBINDEX, TROPONINI in the last 168 hours.  BNP: BNP (last 3 results) Recent Labs    07/22/19 1039  BNP 15.3    ProBNP (last 3 results) No results for  input(s): PROBNP in the last 8760 hours.    Other results:  Imaging: DG Chest Port 1 View  Result Date: 09/02/2019 CLINICAL DATA:  History of ECMO EXAM: PORTABLE CHEST 1 VIEW COMPARISON:  Radiograph 09/01/2019 FINDINGS: Tracheostomy tube tip terminates in the mid trachea. A transesophageal feeding tube tip terminates in the upper abdomen likely terminating near the 3rd to 4th portion of the duodenum. Large bore central venous catheter sheath, likely ECMO cannulae terminate over the superior cavoatrial junction and inferior cavoatrial junction. Bilateral chest tubes in place. Left subclavian central venous catheter tip terminates at the brachiocephalic-caval confluence. Right upper extremity PICC terminates at the level of the right atrium. Lung volumes remain low, somewhat diminished in right with dense bilateral airspace opacities and extensive air bronchograms again seen throughout both lungs. Suspect at least small right pleural effusion. No clear left effusion. No visible pneumothorax. No acute osseous or soft tissue abnormality. IMPRESSION: 1. Lines and tubes as above. 2. Persistent low lung volumes, diminishing in the right, with dense bilateral  airspace opacities and extensive air bronchograms throughout both lungs. 3. Suspect at least small right pleural effusion. Electronically Signed   By: Lovena Le M.D.   On: 09/02/2019 05:07   DG CHEST PORT 1 VIEW  Result Date: 09/01/2019 CLINICAL DATA:  COVID-19.  ECMO.  Chest tube EXAM: PORTABLE CHEST 1 VIEW COMPARISON:  08/31/2019 FINDINGS: Tracheostomy in good position. Feeding tube enters the stomach with the tip not visualized. Right jugular large-bore catheter extending into the lower SVC. Inferior vena cava large bore catheter extending into the cavoatrial junction. Bilateral chest tubes. No pneumothorax. Severe bilateral airspace disease. Slight improved lung volumes. IMPRESSION: Severe bilateral airspace disease with slight improvement in lung volumes. Support lines unchanged.  No pneumothorax. Electronically Signed   By: Franchot Gallo M.D.   On: 09/01/2019 07:26     Medications:     Scheduled Medications: . artificial tears  1 application Both Eyes I3J  . vitamin C  500 mg Per Tube Daily  . chlorhexidine gluconate (MEDLINE KIT)  15 mL Mouth Rinse BID  . Chlorhexidine Gluconate Cloth  6 each Topical Daily  . cloNIDine  0.1 mg Per Tube BID  . diazepam  20 mg Per Tube Q6H  . feeding supplement (PRO-STAT SUGAR FREE 64)  60 mL Per Tube BID  . gabapentin  450 mg Per Tube Q8H  . HYDROmorphone  8 mg Per Tube Q4H  . insulin aspart  3-9 Units Subcutaneous Q4H  . ipratropium-albuterol  3 mL Nebulization Q4H  . mouth rinse  15 mL Mouth Rinse 10 times per day  . montelukast  10 mg Per Tube QHS  . oxymetazoline  1 spray Each Nare BID  . pantoprazole sodium  40 mg Per Tube QHS  . propranolol  20 mg Per Tube TID  . sodium chloride flush  10-40 mL Intracatheter Q12H  . sodium chloride flush  10-40 mL Intracatheter Q12H  . Thrombi-Pad  1 each Topical Once  . zinc sulfate  220 mg Per Tube Daily    Infusions: . sodium chloride    . sodium chloride 10 mL/hr at 08/29/19 1714  . sodium  chloride Stopped (08/30/19 1638)  . albumin human 12.5 g (09/01/19 1940)  . cefTRIAXone (ROCEPHIN)  IV Stopped (09/01/19 0936)  . dexmedetomidine (PRECEDEX) IV infusion 1.6 mcg/kg/hr (09/02/19 8250)  . dextrose    . feeding supplement (PIVOT 1.5 CAL) 1,000 mL (09/02/19 0210)  . heparin 2,650 Units/hr (09/02/19 0122)  .  HYDROmorphone 20 mg/hr (09/01/19 2305)  . midazolam (VERSED) infusion Stopped (09/01/19 1740)  . norepinephrine 2 mcg/min (09/01/19 2100)  . propofol (DIPRIVAN) infusion Stopped (08/31/19 0910)    PRN Medications: Place/Maintain arterial line **AND** sodium chloride, sodium chloride, sodium chloride, acetaminophen (TYLENOL) oral liquid 160 mg/5 mL, acetaminophen, albumin human, dextrose, hydrALAZINE, HYDROmorphone, magnesium hydroxide, metoprolol tartrate, midazolam, ondansetron (ZOFRAN) IV, senna-docusate, sodium chloride, sodium chloride flush, sodium chloride flush, sodium chloride flush   Assessment/Plan:    1. Acute hypoxic/hypercarbic respiratory failure due to COVID PNA: Remained markedly hypoxic despite full support. Intubated 12/26. S/p bilateral CTs 12/26 for pneumothoraces.  TEE on 12/26 LVEF 70% RV ok. VV ECMO begun 12/26. Tracheostomy 12/29.  COVID ARDS at this point. ECMO circuit changed 1/8 & 1/23 - ECMO parameters stable. Of paralytics. Weaning sedation but became hypertensive and agitated with vent dyssynchrony. Will adjust sedation. Using propanolol to help decrease myocardial O2 demand - RIJ venous drainage cannula added 1/20. Circuit changed 1/23 - Flows look good.  - Lung compliance, TVs and oxygenation continue improve. Hopefully will continue with lung recovery. CXR worse. May need repeat bronch today. D/w CCM  - Still volume overload. Restart lasix gtt at 4 - Continue to bronch as needed for nasal bleeding into lungs. - LDH 480 -> 412 -> 445 Watch closely - Continue heparin   2. Bilateral PTX: Due to barotrauma.  - Driving pressure minimized at <  15 Stable. No change  3. COVID PNA: management of COVID infection per CCM. CXR with bilateral multifocal PNA progressing to likely ARDS.  - s/p 10 days of remdesivir - 12/16, 12/2 s/p tociluzimab - 12/17 s/p convalescent plasma - Decadron at 76m/daily completed 1/11 - Completed cefepime/vanc - On ceftriaxone for nasal packing  4. ID: Empiric coverage with vancomycin/cefepime initially for possible secondary bacterial PNA, course completed.   - treated with cefepime and vanc for + cx with staph epidimidis, MSSA and serratia (sensitivities ok). - Cover with ceftriaxone for nasal packing - Remains stable - Discussed dosing with PharmD personally.  5. Thrombocytopenia: Slow decrease in platelets, likely low level hemolysis + critical illness.  - Stable 128k this am.  - On hepain. Treatment of epistaxis as abive  6. Anemia: Bleeding at trach site resolved but still with epistaxis - hgb 8.5   today - Transfuse hgb < 8  7. FEN - continue TFs via oral Cor-trak  D/w with ECMO team (ECMO coordinator/specialists, CCM, TCTS and pharmD) at bedside on multidisciplinary rounds. Continue to await ARDS recovery. Details as above.   Length of Stay: 476 CRITICAL CARE Performed by: BGlori Bickers Total critical care time: 35 minutes  Critical care time was exclusive of separately billable procedures and treating other patients.  Critical care was necessary to treat or prevent imminent or life-threatening deterioration.  Critical care was time spent personally by me (independent of midlevel providers or residents) on the following activities: development of treatment plan with patient and/or surrogate as well as nursing, discussions with consultants, evaluation of patient's response to treatment, examination of patient, obtaining history from patient or surrogate, ordering and performing treatments and interventions, ordering and review of laboratory studies, ordering and review of  radiographic studies, pulse oximetry and re-evaluation of patient's condition.    DGlori BickersMD 09/02/2019, 8:07 AM  Advanced Heart Failure Team Pager [phone](M-F; 7a - 4p)  Please contact [facility] for night-coverage after hours (4p -7a ) and weekends on [email or web address]

## 2019-09-02 NOTE — Progress Notes (Addendum)
ANTICOAGULATION CONSULT NOTE - Follow Up Consult  Pharmacy Consult for heparin Indication: ECMO  Patient Measurements: Height: 5' 8"  (172.7 cm) Weight: (!) 328 lb 7.8 oz (149 kg) IBW/kg (Calculated) : 68.4 Heparin Dosing Weight: 104.5kg  Vital Signs: Temp: 97.5 F (36.4 C) (01/21 1600) Temp Source: Axillary (01/21 1600) Pulse Rate: 87 (01/21 1700)  Labs: Recent Labs    08/31/19 1630 08/31/19 1638 09/01/19 0414 09/01/19 0425 09/01/19 1623 09/01/19 1629 09/02/19 0443 09/02/19 0443 09/02/19 0627 09/02/19 0627 09/02/19 0739 09/02/19 1656  HGB 9.2*   < > 8.6*   < > 8.4*   < > 8.5*   < > 9.5*   < > 8.8* 8.8*  HCT 29.0*   < > 28.5*   < > 28.1*   < > 29.2*   < > 28.0*  --  26.0* 29.3*  PLT 116*   < > 122*   < > 124*  --  128*  --   --   --   --  144*  APTT 109*   < > 149*  --  133*  --  132*  --   --   --   --   --   HEPARINUNFRC 0.47   < > 0.31  --  0.51  --  0.41  --   --   --   --   --   CREATININE 0.68  --  0.55*  --   --   --  0.64  --   --   --   --   --    < > = values in this interval not displayed.    Assessment: 44yo male on VV ECMO on heparin infusion.   Heparin level came back therapeutic at 0.36, aPTT at 108, on 2650 units/hr. Hgb 8.8, plt 144. No infusion issues. Still having decent nosebleeds.   Goal of Therapy:  Heparin level 0.3-0.5 units/ml   Plan:  Reduce heparin gtt at 2600 units/hr  Monitor heparin level and CBC 5AM/5PM, daily LDH Monitor for any increase in bleeding from mouth  Antonietta Jewel, PharmD, [place] Clinical Pharmacist  Phone: [phone]  Please check AMION for all [place] phone numbers After 10:00 PM, call [place] [phone]  09/02/2019 6:25 PM

## 2019-09-02 NOTE — Progress Notes (Signed)
Patient's oxygen saturations sustained at 80-83 on 100% fIo2. Patient was seen doing stomach breathing asynchronous with the vent, eyes wide open and blood pressure in the 200s. Prn metoprolol and hydralazine given.. Versed was turned back on after given  boluses. RT changed ventilator machine  and also changed vent trigger from pressure to flow. Patient now saturating 99%.

## 2019-09-02 NOTE — Progress Notes (Addendum)
ANTICOAGULATION CONSULT NOTE - Follow Up Consult  Pharmacy Consult for heparin Indication: ECMO  Patient Measurements: Height: 5' 8"  (172.7 cm) Weight: (!) 328 lb 7.8 oz (149 kg) IBW/kg (Calculated) : 68.4 Heparin Dosing Weight: 104.5kg  Vital Signs: Temp: 97.5 F (36.4 C) (01/21 1600) Temp Source: Axillary (01/21 1600) Pulse Rate: 87 (01/21 1700)  Labs: Recent Labs    08/31/19 1630 08/31/19 1638 09/01/19 0414 09/01/19 0425 09/01/19 1623 09/01/19 1629 09/02/19 0112 09/02/19 0112 09/02/19 0443 09/02/19 0627  HGB 9.2*   < > 8.6*   < > 8.4*   < > 7.8*   < > 8.5* 9.5*  HCT 29.0*   < > 28.5*   < > 28.1*   < > 23.0*  --  29.2* 28.0*  PLT 116*   < > 122*  --  124*  --   --   --  128*  --   APTT 109*   < > 149*  --  133*  --   --   --  132*  --   HEPARINUNFRC 0.47   < > 0.31  --  0.51  --   --   --  0.41  --   CREATININE 0.68  --  0.55*  --   --   --   --   --  0.64  --    < > = values in this interval not displayed.    Assessment: 44yo male on VV ECMO on heparin infusion. Heparin level slightly above goal at 0.51, although was at the lower end of goal 0.31 this AM with the same heparin rate. CBC stable.  1/24 2245 RN called and pt with small amount of bleeding from mouth. Since level was slightly elevated at 1700, will do small dose decrease.  1/25: Heparin level at goal, spoke with RN, still with some nosebleed, minor bleeding from mouth.  Goal of Therapy:  Heparin level 0.3-0.5 units/ml   Plan:  Continue heparin gtt at 2650 units/hr Monitor heparin level and CBC 5AM/5PM, daily LDH Monitor for any increase in bleeding from mouth  [place] Crane, Roylene Reason, [place] Pharmacist Phone [phone]  09/02/2019 10:26 AM

## 2019-09-02 NOTE — Progress Notes (Signed)
NAME:  [name], MRN:  [id number], DOB:  October 17, 1975, LOS: 75 ADMISSION DATE:  07/22/2019, CONSULTATION DATE:  12/24 REFERRING MD:  Sloan Leiter, CHIEF COMPLAINT:  Dyspnea   Brief History   44 y/o M admitted 12/14 with COVID pneumonia causing acute hypoxemic respiratory failure. Developed pneumomediastinum 12/23 with concern for possible bilateral small pneumothoraces on 12/24.  PCCM consulted for evaluation of pneumothoraces  Past Medical History  GERD HTN Asthma  Significant Hospital Events   12/14 admit with hypoxemic respiratory failure in setting of COVID-19 pneumonia 12/23 noted to have pneumomediastinum on chest x-ray 12/24 PCCM consulted for evaluation 12/26 worsening hypoxemia, intubated  12/27 VV fem/fem ECMO cannulation See bedside nursing event notes for full rundown of procedures after 12/27   Consults:  PCCM, CTS, CHF, Palliative  Procedures:  See bedside nursing event notes for full rundown of procedures.  Significant Diagnostic Tests:  CT chest 12/22 >> extensive pneumomediastinum, multifocal patchy bilateral groundglass opacities CXR 1/25 >> improving bilateral infiltrates. ECMO cannulae in place.  Micro Data:  BCx2 12/14 >> negative  Sputum 12/15 >> negative  1/9 bronch: serratia and MSSA 1/2 acid fast smear bronch: negative 1/2 fungal cx bronch: pending 1/12 BAL: serratia 1/18 BAL >> rare Serratia.  Antimicrobials:  Received remdesivir, steroids, actemra and convalescent plasma  5 days cefepime 12/24-12/29 Cefepime 1/4->1/14 vanc 1/4->1/14 Ceftriaxone 1/14 >>   Interim history/subjective:  Improving oxygenation. Decreased epistaxis following ENT cautery and packing over weekend. Tolerating decreased sedation over last several days.   Increased O2 requirements overnight have correlated with increased patient effort  Objective   Blood pressure (!) 165/62, pulse 90, temperature 98 F (36.7 C), temperature source Oral, resp. rate (!) 22, height 5'  8" (1.727 m), weight (!) 147 kg, SpO2 95 %. CVP:  [6 mmHg-16 mmHg] 13 mmHg  Vent Mode: PCV FiO2 (%):  [60 %-100 %] 70 % Set Rate:  [15 bmp] 15 bmp PEEP:  [15 cmH20] 15 cmH20 Plateau Pressure:  [26 cmH20-28 cmH20] 26 cmH20   Intake/Output Summary (Last 24 hours) at 09/02/2019 0905 Last data filed at 09/02/2019 0800 Gross per 24 hour  Intake 4530.12 ml  Output 3350 ml  Net 1180.12 ml   Filed Weights   08/30/19 0700 09/01/19 0600 09/02/19 0100  Weight: (!) 145.3 kg (!) 142.7 kg (!) 147 kg   Examination GEN: obese ill man on vent HEENT: trach in place, epistaxis appears to have stopped CV: RRR, ext warm PULM: improving air movement with anterior vesicular breath sounds. Increased patient effort. GI: Soft, + BS EXT: Global anascarca has improved significantly - mild statis dermatitis of LLE NEURO: sedated, NMB off. PSYCH: RASS -4 SKIN: pale Femoral cannulas in place, sites looks okay Bilateral chest tubes in place, no tidaling or air leak, ongoing serosanguinous output Foley in place dark yellow urine Rectal tube in place with tan loose stool   CXR improving oxygenation of upper lung fields with dense consolidation at bases bilaterally.  Now better able to see the diaphragms. Continued dense consolidation of left base.  Lung ultrasound shows continued dense consolidation of both lungs particularly on the left.   Flow (LPM): 4.80 ECMO Mode: VV ECMO Device: Cardiohelp   $ Ventilator Initial/Subsequent : Subsequent, Vent Mode: PCV, Set Rate: 15 bmp, FiO2 (%): 70 %, I Time: 0.9 Sec(s), PEEP: 15 cmH20 Vt today 275m  Net IO Since Admission: 34,803.17 mL [09/02/19 0905]  Resolved Hospital Problem list   MSSA and serratia marcescens pneumonia   Assessment &  Plan:    Acute hypoxemic respiratory failure with ARDS secondary to COVID-19, s/p tracheostomy requiring mechanical ventilation and VV ECMO support.  COVID has been treated with all evidence based therapies, we are dealing  with lingering severe diffuse alveolar damage. Marked improvement in aeration of both lungs. Continue to wean sedation and allow patient participation. Resedate only if gas exchange is significantly impaired.  Posterior epistaxis.  Repacked multiple times  Cortrack removed and epistaxis appears to have resolved. Both nostrils repacked 1/23 by ENT  Daily Goals Checklist  Pain/Anxiety/Delirium protocol (if indicated): Continue current oral regimen. Wean Versed off. Continue with Dilaudid and Precedex. VAP protocol (if indicated): Bundle in place. Respiratory support goals: Continue PCV with ultra-lung protective ventilation, generous PEEP.  PEEP currently optimized. Continue to oversleep as may help with air hunger during sedation wean. Blood pressure target:  MAP>65, off pressors DVT prophylaxis: on systemic anticoagulation on VV-ECMO -  Nutrition Status: Nutrition Problem: Increased nutrient needs Etiology: acute illness(COVID PNA) Signs/Symptoms: estimated needs Interventions: Tube feeding, Prostat GI prophylaxis: Protonix per tube. Fluid status goals: Continue aggressive diuresis as tolerated aim for further 2 L/day Urinary catheter: Guide hemodynamic management Glucose control: euglycemic on SSI. Mobility/therapy needs: Bedrest.  Daily labs: as per ECMO protocol. Code Status: Full code  Family Communication: updated mother at the bedside today. Disposition: ICU   Labs   CBC: CBC Latest Ref Rng & Units 09/02/2019 09/02/2019 09/02/2019  WBC 4.0 - 10.5 K/uL - 10.2 -  Hemoglobin 13.0 - 17.0 g/dL 9.5(L) 8.5(L) 7.8(L)  Hematocrit 39.0 - 52.0 % 28.0(L) 29.2(L) 23.0(L)  Platelets 150 - 400 K/uL - 128(L) -    Basic Metabolic Panel: BMP Latest Ref Rng & Units 09/02/2019 09/02/2019 09/02/2019  Glucose 70 - 99 mg/dL - 160(H) -  BUN 6 - 20 mg/dL - 37(H) -  Creatinine 0.61 - 1.24 mg/dL - 0.64 -  Sodium 135 - 145 mmol/L 145 145 146(H)  Potassium 3.5 - 5.1 mmol/L 4.8 4.7 4.3  Chloride 98  - 111 mmol/L - 106 -  CO2 22 - 32 mmol/L - 28 -  Calcium 8.9 - 10.3 mg/dL - 9.3 -    GFR: Estimated Creatinine Clearance: 164.6 mL/min (by C-G formula based on SCr of 0.64 mg/dL). Recent Labs  Lab 08/30/19 0427 08/30/19 1558 08/31/19 0404 08/31/19 1150 08/31/19 1630 09/01/19 0414 09/01/19 1623 09/02/19 0443  WBC 11.4*   < > 9.7   < > 8.9 9.1 8.3 10.2  LATICACIDVEN 1.2  --  0.9  --   --  1.3  --  1.4   < > = values in this interval not displayed.    Liver Function Tests: Recent Labs  Lab 08/31/19 1150  AST 24  ALT 26  ALKPHOS 75  BILITOT 0.7  PROT 6.2*  ALBUMIN 2.2*   No results for input(s): LIPASE, AMYLASE in the last 168 hours. No results for input(s): AMMONIA in the last 168 hours.  ABG    Component Value Date/Time   PHART 7.415 09/02/2019 0627   PCO2ART 47.6 09/02/2019 0627   PO2ART 51.0 (L) 09/02/2019 0627   HCO3 30.6 (H) 09/02/2019 0627   TCO2 32 09/02/2019 0627   ACIDBASEDEF 2.0 08/28/2019 1519   O2SAT 86.0 09/02/2019 0627     Coagulation Profile: Recent Labs  Lab 08/28/19 0406  INR 1.4*    Cardiac Enzymes: No results for input(s): CKTOTAL, CKMB, CKMBINDEX, TROPONINI in the last 168 hours.  HbA1C: Hgb A1c MFr Bld  Date/Time Value Ref Range  Status  07/24/2019 04:50 AM 6.7 (H) 4.8 - 5.6 % Final    Comment:    (NOTE) Pre diabetes:          5.7%-6.4% Diabetes:              >6.4% Glycemic control for   <7.0% adults with diabetes   09/30/2017 05:36 AM 5.7 (H) 4.8 - 5.6 % Final    Comment:    (NOTE)         Prediabetes: 5.7 - 6.4         Diabetes: >6.4         Glycemic control for adults with diabetes: <7.0     CBG: Recent Labs  Lab 09/01/19 1548 09/01/19 1926 09/01/19 2346 09/02/19 0430 09/02/19 0738  GLUCAP 160* 157* 141* 146* 159*    CRITICAL CARE Performed by: Kipp Brood   Total critical care time: 50 minutes  Critical care time was exclusive of separately billable procedures and treating other patients.  Critical  care was necessary to treat or prevent imminent or life-threatening deterioration.  Critical care was time spent personally by me on the following activities: development of treatment plan with patient and/or surrogate as well as nursing, discussions with consultants, evaluation of patient's response to treatment, examination of patient, obtaining history from patient or surrogate, ordering and performing treatments and interventions, ordering and review of laboratory studies, ordering and review of radiographic studies, pulse oximetry, re-evaluation of patient's condition and participation in multidisciplinary rounds.  Kipp Brood, MD [facility] ICU Physician [place]  Pager: [phone] Mobile: [phone] After hours: [phone].     09/02/2019, 9:05 AM

## 2019-09-03 ENCOUNTER — Inpatient Hospital Stay (HOSPITAL_COMMUNITY): Payer: Managed Care, Other (non HMO)

## 2019-09-03 ENCOUNTER — Encounter: Payer: Self-pay | Admitting: *Deleted

## 2019-09-03 ENCOUNTER — Encounter (HOSPITAL_COMMUNITY): Payer: Self-pay | Admitting: Anesthesiology

## 2019-09-03 LAB — POCT I-STAT 7, (LYTES, BLD GAS, ICA,H+H)
Acid-Base Excess: 5 mmol/L — ABNORMAL HIGH (ref 0.0–2.0)
Acid-Base Excess: 6 mmol/L — ABNORMAL HIGH (ref 0.0–2.0)
Acid-Base Excess: 7 mmol/L — ABNORMAL HIGH (ref 0.0–2.0)
Acid-Base Excess: 7 mmol/L — ABNORMAL HIGH (ref 0.0–2.0)
Bicarbonate: 30.1 mmol/L — ABNORMAL HIGH (ref 20.0–28.0)
Bicarbonate: 31.4 mmol/L — ABNORMAL HIGH (ref 20.0–28.0)
Bicarbonate: 32.2 mmol/L — ABNORMAL HIGH (ref 20.0–28.0)
Bicarbonate: 32.5 mmol/L — ABNORMAL HIGH (ref 20.0–28.0)
Calcium, Ion: 1.3 mmol/L (ref 1.15–1.40)
Calcium, Ion: 1.28 mmol/L (ref 1.15–1.40)
Calcium, Ion: 1.26 mmol/L (ref 1.15–1.40)
Calcium, Ion: 1.23 mmol/L (ref 1.15–1.40)
HCT: 26 % — ABNORMAL LOW (ref 39.0–52.0)
HCT: 27 % — ABNORMAL LOW (ref 39.0–52.0)
HCT: 31 % — ABNORMAL LOW (ref 39.0–52.0)
HCT: 27 % — ABNORMAL LOW (ref 39.0–52.0)
Hemoglobin: 8.8 g/dL — ABNORMAL LOW (ref 13.0–17.0)
Hemoglobin: 9.2 g/dL — ABNORMAL LOW (ref 13.0–17.0)
Hemoglobin: 10.5 g/dL — ABNORMAL LOW (ref 13.0–17.0)
Hemoglobin: 9.2 g/dL — ABNORMAL LOW (ref 13.0–17.0)
O2 Saturation: 90 %
O2 Saturation: 96 %
O2 Saturation: 86 %
O2 Saturation: 91 %
Patient temperature: 98.1
Patient temperature: 98.9
Patient temperature: 98.2
Potassium: 4.1 mmol/L (ref 3.5–5.1)
Potassium: 3.7 mmol/L (ref 3.5–5.1)
Potassium: 3.5 mmol/L (ref 3.5–5.1)
Potassium: 3.5 mmol/L (ref 3.5–5.1)
Sodium: 145 mmol/L (ref 135–145)
Sodium: 145 mmol/L (ref 135–145)
Sodium: 145 mmol/L (ref 135–145)
Sodium: 146 mmol/L — ABNORMAL HIGH (ref 135–145)
TCO2: 32 mmol/L (ref 22–32)
TCO2: 33 mmol/L — ABNORMAL HIGH (ref 22–32)
TCO2: 34 mmol/L — ABNORMAL HIGH (ref 22–32)
TCO2: 34 mmol/L — ABNORMAL HIGH (ref 22–32)
pCO2 arterial: 48.6 mmHg — ABNORMAL HIGH (ref 32.0–48.0)
pCO2 arterial: 47.2 mmHg (ref 32.0–48.0)
pCO2 arterial: 50.1 mmHg — ABNORMAL HIGH (ref 32.0–48.0)
pCO2 arterial: 47.4 mmHg (ref 32.0–48.0)
pH, Arterial: 7.399 (ref 7.350–7.450)
pH, Arterial: 7.429 (ref 7.350–7.450)
pH, Arterial: 7.417 (ref 7.350–7.450)
pH, Arterial: 7.443 (ref 7.350–7.450)
pO2, Arterial: 59 mmHg — ABNORMAL LOW (ref 83.0–108.0)
pO2, Arterial: 82 mmHg — ABNORMAL LOW (ref 83.0–108.0)
pO2, Arterial: 52 mmHg — ABNORMAL LOW (ref 83.0–108.0)
pO2, Arterial: 58 mmHg — ABNORMAL LOW (ref 83.0–108.0)

## 2019-09-03 LAB — BASIC METABOLIC PANEL
Anion gap: 12 (ref 5–15)
BUN: 44 mg/dL — ABNORMAL HIGH (ref 6–20)
CO2: 28 mmol/L (ref 22–32)
Calcium: 9.4 mg/dL (ref 8.9–10.3)
Chloride: 105 mmol/L (ref 98–111)
Creatinine, Ser: 0.79 mg/dL (ref 0.61–1.24)
GFR calc Af Amer: >60 mL/min (ref >60)
GFR calc non Af Amer: >60 mL/min (ref >60)
Glucose, Bld: 160 mg/dL — ABNORMAL HIGH (ref 70–99)
Potassium: 4 mmol/L (ref 3.5–5.1)
Sodium: 145 mmol/L (ref 135–145)

## 2019-09-03 LAB — CBC
HCT: 29.2 % — ABNORMAL LOW (ref 39.0–52.0)
HCT: 30.3 % — ABNORMAL LOW (ref 39.0–52.0)
Hemoglobin: 8.7 g/dL — ABNORMAL LOW (ref 13.0–17.0)
Hemoglobin: 9.1 g/dL — ABNORMAL LOW (ref 13.0–17.0)
MCH: 28.6 pg (ref 26.0–34.0)
MCH: 28.5 pg (ref 26.0–34.0)
MCHC: 29.8 g/dL — ABNORMAL LOW (ref 30.0–36.0)
MCHC: 30 g/dL (ref 30.0–36.0)
MCV: 96.1 fL (ref 80.0–100.0)
MCV: 95 fL (ref 80.0–100.0)
Platelets: 144 10*3/uL — ABNORMAL LOW (ref 150–400)
Platelets: 174 10*3/uL (ref 150–400)
RBC: 3.04 MIL/uL — ABNORMAL LOW (ref 4.22–5.81)
RBC: 3.19 MIL/uL — ABNORMAL LOW (ref 4.22–5.81)
RDW: 18.6 % — ABNORMAL HIGH (ref 11.5–15.5)
RDW: 18.5 % — ABNORMAL HIGH (ref 11.5–15.5)
WBC: 10.7 10*3/uL — ABNORMAL HIGH (ref 4.0–10.5)
WBC: 14.4 10*3/uL — ABNORMAL HIGH (ref 4.0–10.5)
nRBC: 0.8 % — ABNORMAL HIGH (ref 0.0–0.2)
nRBC: 0.4 % — ABNORMAL HIGH (ref 0.0–0.2)

## 2019-09-03 LAB — APTT
aPTT: 98 seconds — ABNORMAL HIGH (ref 24–36)
aPTT: 88 seconds — ABNORMAL HIGH (ref 24–36)

## 2019-09-03 LAB — HEPARIN LEVEL (UNFRACTIONATED)
Heparin Unfractionated: 0.37 IU/mL (ref 0.30–0.70)
Heparin Unfractionated: 0.44 IU/mL (ref 0.30–0.70)

## 2019-09-03 LAB — GLUCOSE, CAPILLARY
Glucose-Capillary: 138 mg/dL — ABNORMAL HIGH (ref 70–99)
Glucose-Capillary: 144 mg/dL — ABNORMAL HIGH (ref 70–99)
Glucose-Capillary: 159 mg/dL — ABNORMAL HIGH (ref 70–99)
Glucose-Capillary: 163 mg/dL — ABNORMAL HIGH (ref 70–99)
Glucose-Capillary: 169 mg/dL — ABNORMAL HIGH (ref 70–99)
Glucose-Capillary: 169 mg/dL — ABNORMAL HIGH (ref 70–99)
Glucose-Capillary: 174 mg/dL — ABNORMAL HIGH (ref 70–99)

## 2019-09-03 LAB — COOXEMETRY PANEL
Carboxyhemoglobin: 3.1 % — ABNORMAL HIGH (ref 0.5–1.5)
Methemoglobin: 0.8 % (ref 0.0–1.5)
O2 Saturation: 69.6 %
Total hemoglobin: 8.3 g/dL — ABNORMAL LOW (ref 12.0–16.0)

## 2019-09-03 LAB — LACTATE DEHYDROGENASE: LDH: 432 U/L — ABNORMAL HIGH (ref 98–192)

## 2019-09-03 LAB — LACTIC ACID, PLASMA: Lactic Acid, Venous: 1.2 mmol/L (ref 0.5–1.9)

## 2019-09-03 LAB — TRIGLYCERIDES: Triglycerides: 192 mg/dL — ABNORMAL HIGH (ref <150)

## 2019-09-03 MED ORDER — VECURONIUM BROMIDE 10 MG IV SOLR
INTRAVENOUS | Status: AC
  Filled 2019-09-03: qty 10

## 2019-09-03 MED ORDER — HYDRALAZINE HCL 20 MG/ML IJ SOLN
20.0000 mg | INTRAMUSCULAR | Status: DC | PRN
  Administered 2019-09-03 – 2019-11-02 (×40): 20 mg via INTRAVENOUS
  Filled 2019-09-03 (×41): qty 1

## 2019-09-03 MED ORDER — ROCURONIUM BROMIDE 50 MG/5ML IV SOLN
100.0000 mg | Freq: Once | INTRAVENOUS | Status: AC
  Administered 2019-09-03: 100 mg via INTRAVENOUS
  Filled 2019-09-03: qty 10

## 2019-09-03 MED ORDER — STERILE WATER FOR INJECTION IJ SOLN
INTRAMUSCULAR | Status: AC
  Filled 2019-09-03: qty 10

## 2019-09-03 MED ORDER — LABETALOL HCL 5 MG/ML IV SOLN
INTRAVENOUS | Status: AC
  Administered 2019-09-03: 09:00:00 10 mg
  Filled 2019-09-03: qty 4

## 2019-09-03 MED ORDER — "THROMBI-PAD 3""X3"" EX PADS"
1.0000 | MEDICATED_PAD | Freq: Once | CUTANEOUS | Status: DC
  Filled 2019-09-03: qty 1

## 2019-09-03 MED ORDER — QUETIAPINE FUMARATE 50 MG PO TABS
50.0000 mg | ORAL_TABLET | Freq: Two times a day (BID) | ORAL | Status: DC
  Administered 2019-09-03 – 2019-09-22 (×37): 50 mg
  Filled 2019-09-03 (×38): qty 1

## 2019-09-03 MED ORDER — POTASSIUM CHLORIDE 20 MEQ/15ML (10%) PO SOLN
20.0000 meq | ORAL | Status: AC
  Administered 2019-09-03 – 2019-09-04 (×3): 20 meq
  Filled 2019-09-03 (×3): qty 15

## 2019-09-03 MED ORDER — VECURONIUM BROMIDE 10 MG IV SOLR
10.0000 mg | Freq: Once | INTRAVENOUS | Status: AC
  Administered 2019-09-03: 08:00:00 10 mg via INTRAVENOUS

## 2019-09-03 MED ORDER — CLONIDINE HCL 0.2 MG PO TABS
0.3000 mg | ORAL_TABLET | Freq: Two times a day (BID) | ORAL | Status: DC
  Administered 2019-09-03 – 2019-09-07 (×8): 0.3 mg
  Filled 2019-09-03 (×9): qty 1

## 2019-09-03 NOTE — Progress Notes (Signed)
After bath patient began breath stacking on vent up to RR of 35. Sedation titrated up and PRN medications given to promote synchrony.   After turning, significant recirculation was noted in IJ cannula. RPM's titrated to maintain flows and appropriate saturations. PRN albumin given to facilitate increased flows. During this time, ES noted higher flows in fem cannula based on centrimag flow censor, clamp had to be moved from IJ cannula to fem drain cannula to even out flows at Y-connector site. Y-connector site has visually looked the same from the beginning of the shift (fibrin noted on both sides of connector). At this time, current cardiohelp flows are 4.68L/min and fem flow censor is 2.43L/min.

## 2019-09-03 NOTE — Progress Notes (Addendum)
NAME:  [name], MRN:  [id number], DOB:  Dec 02, 1975, LOS: 62 ADMISSION DATE:  07/22/2019, CONSULTATION DATE:  12/24 REFERRING MD:  Sloan Leiter, CHIEF COMPLAINT:  Dyspnea   Brief History   44 y/o M admitted 12/14 with COVID pneumonia causing acute hypoxemic respiratory failure. Developed pneumomediastinum 12/23 with concern for possible bilateral small pneumothoraces on 12/24.  PCCM consulted for evaluation of pneumothoraces  Past Medical History  GERD HTN Asthma  Significant Hospital Events   12/14 admit with hypoxemic respiratory failure in setting of COVID-19 pneumonia 12/23 noted to have pneumomediastinum on chest x-ray 12/24 PCCM consulted for evaluation 12/26 worsening hypoxemia, intubated  12/27 VV fem/fem ECMO cannulation See bedside nursing event notes for full rundown of procedures after 12/27   Consults:  PCCM, CTS, CHF, Palliative  Procedures:  See bedside nursing event notes for full rundown of procedures.  Significant Diagnostic Tests:  CT chest 12/22 >> extensive pneumomediastinum, multifocal patchy bilateral groundglass opacities CXR 1/25 >> improving bilateral infiltrates. ECMO cannulae in place.  Micro Data:  BCx2 12/14 >> negative  Sputum 12/15 >> negative  1/9 bronch: serratia and MSSA 1/2 acid fast smear bronch: negative 1/2 fungal cx bronch: pending 1/12 BAL: serratia 1/18 BAL >> rare Serratia.  Antimicrobials:  Received remdesivir, steroids, actemra and convalescent plasma  5 days cefepime 12/24-12/29 Cefepime 1/4->1/14 vanc 1/4->1/14 Ceftriaxone 1/14 >>   Interim history/subjective:  Improving oxygenation. Overnight increasing nasal bleeding.  Drop in saturation with increase SvO2 to 80's with flashes of saturated blood in SVC cannula. Improved by moving clamp to IVC cannula.  Objective   Blood pressure (!) 114/39, pulse 71, temperature 98.1 F (36.7 C), temperature source Axillary, resp. rate 18, height _0  (1.727 m), weight (!) 145.9  kg, SpO2 100 %. CVP:  [8 mmHg-13 mmHg] 10 mmHg  Vent Mode: PCV FiO2 (%):  [60 %-100 %] 100 % Set Rate:  [15 bmp] 15 bmp PEEP:  [15 cmH20] 15 cmH20 Plateau Pressure:  [28 cmH20-29 cmH20] 29 cmH20   Intake/Output Summary (Last 24 hours) at 09/03/2019 0847 Last data filed at 09/03/2019 0800 Gross per 24 hour  Intake 4883.08 ml  Output 4850 ml  Net 33.08 ml   Filed Weights   09/01/19 0600 09/02/19 0100 09/03/19 0600  Weight: (!) 142.7 kg (!) 147 kg (!) 145.9 kg   Examination GEN: obese ill man on vent HEENT: trach in place, bleeding from the left nostril predominantly.  CV: RRR, ext warm PULM: improving air movement with anterior vesicular breath sounds, bronchial to anterior axillary line.  GI: Soft, + BS EXT: Global anascarca has improved significantly - mild statis dermatitis of LLE. Appears more edematous compared to 3 days ago NEURO: sedated, NMB off. Opens eyes and blinks, pupils are reactive. PSYCH: RASS -4 SKIN: pale Femoral cannulas in place, sites looks okay Bilateral chest tubes in place, no tidaling or air leak, small amount. ongoing serosanguinous output Foley in place dark yellow urine Rectal tube in place with tan loose stool  I performed a bedside echocardiogram which demonstrated normal LV function. Improved RV function but with mild enlargement with persistent septal flattening predominantly in systole. Mild TR. Return cannula visualized in right atrium with jet directed towards IAS, will flow through tricuspid valve visualized.    CXR improving oxygenation of upper lung fields with dense consolidation at bases bilaterally.  Now better able to see the diaphragms. Continued dense consolidation of left base. Day to day difference likely related to technical differences.   Flow (  LPM): 4.91 ECMO Mode: VV ECMO Device: Cardiohelp   $ Ventilator Initial/Subsequent : Subsequent, Vent Mode: PCV, Set Rate: 15 bmp, FiO2 (%): 100 %, I Time: 0.8 Sec(s), PEEP: 15 cmH20 Vt  today 273m  Net IO Since Admission: 34,836.25 mL [09/03/19 0847]  Resolved Hospital Problem list   MSSA and serratia marcescens pneumonia   Assessment & Plan:    Acute hypoxemic respiratory failure with ARDS secondary to COVID-19, s/p tracheostomy requiring mechanical ventilation and VV ECMO support.  COVID has been treated with all evidence based therapies, we are dealing with lingering severe diffuse alveolar damage. Marked improvement in aeration of both lungs. Recirculation likely due to migration of return cannula. SVC cannula repositioned. Continue to wean sedation and allow patient participation. Resedate only if gas exchange is significantly impaired.  Posterior epistaxis.  Repacked multiple times  Cortrack removed and epistaxis appears to have resolved. Left nostril repacked with thrombi-pad. Petrolatum jelly to right to keep moist.  Daily Goals Checklist  Pain/Anxiety/Delirium protocol (if indicated): Continue current oral regimen. Wean Versed off. Continue with Dilaudid and Precedex.  Restart low dose quetiapine. VAP protocol (if indicated): Bundle in place. Respiratory support goals: Continue PCV with ultra-lung protective ventilation, generous PEEP.  PEEP currently optimized. Continue to oversleep as may help with air hunger during sedation wean. Blood pressure target:  MAP>65, off pressors ECMO SVC cannula pulled back 2cm  DVT prophylaxis: on systemic anticoagulation on VV-ECMO - continue to current targets in spite of bleeding given circuit performance issues.  Nutrition Status: Nutrition Problem: Increased nutrient needs Etiology: acute illness(COVID PNA) Signs/Symptoms: estimated needs Interventions: Tube feeding, Prostat GI prophylaxis: Protonix per tube. Fluid status goals: Increased furosemide infusion. Urinary catheter: Guide hemodynamic management Glucose control: euglycemic on SSI. Mobility/therapy needs: Bedrest.  Daily labs: as per ECMO protocol. Code  Status: Full code  Family Communication:family updated by RN. Disposition: ICU   Labs   CBC: CBC Latest Ref Rng & Units 09/03/2019 09/03/2019 09/03/2019  WBC 4.0 - 10.5 K/uL - 10.7(H) -  Hemoglobin 13.0 - 17.0 g/dL 9.2(L) 8.7(L) 8.8(L)  Hematocrit 39.0 - 52.0 % 27.0(L) 29.2(L) 26.0(L)  Platelets 150 - 400 K/uL - 144(L) -    Basic Metabolic Panel: BMP Latest Ref Rng & Units 09/03/2019 09/03/2019 09/03/2019  Glucose 70 - 99 mg/dL - 160(H) -  BUN 6 - 20 mg/dL - 44(H) -  Creatinine 0.61 - 1.24 mg/dL - 0.79 -  Sodium 135 - 145 mmol/L 145 145 145  Potassium 3.5 - 5.1 mmol/L 3.7 4.0 4.1  Chloride 98 - 111 mmol/L - 105 -  CO2 22 - 32 mmol/L - 28 -  Calcium 8.9 - 10.3 mg/dL - 9.4 -    GFR: Estimated Creatinine Clearance: 163.9 mL/min (by C-G formula based on SCr of 0.79 mg/dL). Recent Labs  Lab 08/31/19 0404 08/31/19 1150 09/01/19 0414 09/01/19 0414 09/01/19 1623 09/02/19 0443 09/02/19 1656 09/03/19 0448  WBC 9.7   < > 9.1   < > 8.3 10.2 10.7* 10.7*  LATICACIDVEN 0.9  --  1.3  --   --  1.4  --  1.2   < > = values in this interval not displayed.    Liver Function Tests: Recent Labs  Lab 08/31/19 1150  AST 24  ALT 26  ALKPHOS 75  BILITOT 0.7  PROT 6.2*  ALBUMIN 2.2*   No results for input(s): LIPASE, AMYLASE in the last 168 hours. No results for input(s): AMMONIA in the last 168 hours.  ABG  Component Value Date/Time   PHART 7.429 09/03/2019 0724   PCO2ART 47.2 09/03/2019 0724   PO2ART 82.0 (L) 09/03/2019 0724   HCO3 31.4 (H) 09/03/2019 0724   TCO2 33 (H) 09/03/2019 0724   ACIDBASEDEF 2.0 08/28/2019 1519   O2SAT 96.0 09/03/2019 0724     Coagulation Profile: Recent Labs  Lab 08/28/19 0406  INR 1.4*    Cardiac Enzymes: No results for input(s): CKTOTAL, CKMB, CKMBINDEX, TROPONINI in the last 168 hours.  HbA1C: Hgb A1c MFr Bld  Date/Time Value Ref Range Status  07/24/2019 04:50 AM 6.7 (H) 4.8 - 5.6 % Final    Comment:    (NOTE) Pre diabetes:           5.7%-6.4% Diabetes:              >6.4% Glycemic control for   <7.0% adults with diabetes   09/30/2017 05:36 AM 5.7 (H) 4.8 - 5.6 % Final    Comment:    (NOTE)         Prediabetes: 5.7 - 6.4         Diabetes: >6.4         Glycemic control for adults with diabetes: <7.0     CBG: Recent Labs  Lab 09/02/19 1544 09/02/19 1957 09/02/19 2322 09/03/19 0444 09/03/19 0727  GLUCAP 150* 152* 159* 144* 138*    CRITICAL CARE Performed by: Kipp Brood   Total critical care time: 50 minutes  Critical care time was exclusive of separately billable procedures and treating other patients.  Critical care was necessary to treat or prevent imminent or life-threatening deterioration.  Critical care was time spent personally by me on the following activities: development of treatment plan with patient and/or surrogate as well as nursing, discussions with consultants, evaluation of patient's response to treatment, examination of patient, obtaining history from patient or surrogate, ordering and performing treatments and interventions, ordering and review of laboratory studies, ordering and review of radiographic studies, pulse oximetry, re-evaluation of patient's condition and participation in multidisciplinary rounds.  Kipp Brood, MD [facility] ICU Physician [place]  Pager: [phone] Mobile: [phone] After hours: [phone].     09/03/2019, 8:47 AM

## 2019-09-03 NOTE — Progress Notes (Signed)
eLink Physician-Brief Progress Note Patient Name: [name] DOB: [birth year] MRN: [id number]   Date of Service  09/03/2019  HPI/Events of Note  Pt is on ECMO and heavily sedated. He is dyssynchronous on the ventilator despite optimum sedation.  eICU Interventions  Rocuronium 100 mg iv x 1        Lathon Adan U Makenze Ellett 09/03/2019, 4:51 AM

## 2019-09-03 NOTE — Progress Notes (Signed)
Multiple boluses of Versed and Dilaudid given this AM for bedside procedures per MD order.

## 2019-09-03 NOTE — Progress Notes (Signed)
Continued destauration issues this morning despite optimal flow on ecmo circuit. Still evidence of recirculation with bright red flashing on IJ cannula. Reached out to [facility] for paralytic for continued dyssynchrony issues with ventilator. Pt sats dropped to 70's after paralytic dose.   CXR changed to STAT. Vent turned to 100% FiO2. Called ECMO coordinator and made aware of the events of tonight, also reached out to Dr. Ashley Mariner regarding the events of tonight. Pt also has had moderate bleeding from nares tonight. Coordinator suggesting bronch this AM. Maintain current settings for now until all physicians can be present for rounding and review CXR.

## 2019-09-03 NOTE — Progress Notes (Signed)
ENT Progress Note:  s/p Procedure(s): NASAL ENDOSCOPY WITH EPISTAXIS CONTROL WITH CAUTERIZATION   Subjective: Patient fully sedated and on ECMO  Objective: Vital signs in last 24 hours: Temp:  [98.1 F (36.7 C)-98.6 F (37 C)] 98.1 F (36.7 C) (01/26 0800) Pulse Rate:  [70-102] 87 (01/26 1100) Resp:  [0-29] 16 (01/26 1100) BP: (114-154)/(39-47) 154/47 (01/26 0955) SpO2:  [83 %-100 %] 96 % (01/26 1114) Arterial Line BP: (108-199)/(37-69) 176/59 (01/26 1100) FiO2 (%):  [60 %-100 %] 85 % (01/26 1121) Weight:  [145.9 kg] 145.9 kg (01/26 0600) Weight change: -1.1 kg Last BM Date: 09/03/19  Intake/Output from previous day: 01/25 0701 - 01/26 0700 In: 4875.4 [I.V.:2770.4; NG/GT:1755; IV Piggyback:350] Out: 4780 [Urine:4010; Stool:650; Chest Tube:120] Intake/Output this shift: Total I/O In: 1334.6 [I.V.:774.7; NG/GT:460; IV Piggyback:99.9] Out: 1350 [Urine:1300; Chest Tube:50]  Labs: Recent Labs    09/02/19 1656 09/02/19 1702 09/03/19 0448 09/03/19 0724  WBC 10.7*  --  10.7*  --   HGB 8.8*   < > 8.7* 9.2*  HCT 29.3*   < > 29.2* 27.0*  PLT 144*  --  144*  --    < > = values in this interval not displayed.   Recent Labs    09/02/19 0443 09/02/19 0627 09/03/19 0448 09/03/19 0724  NA 145   < > 145 145  K 4.7   < > 4.0 3.7  CL 106  --  105  --   CO2 28  --  28  --   GLUCOSE 160*  --  160*  --   BUN 37*  --  44*  --   CALCIUM 9.3  --  9.4  --    < > = values in this interval not displayed.    Studies/Results: DG CHEST PORT 1 VIEW  Result Date: 09/03/2019 CLINICAL DATA:  SVC cannula repositioned EXAM: PORTABLE CHEST 1 VIEW COMPARISON:  Earlier same day FINDINGS: Right SVC ECMO cannula appears to have been slightly retracted. IVC cannula is similar. Enteric tube passes into the stomach with tip out of field of view. Endotracheal tube is in similar position. Left subclavian line is unchanged. Bilateral chest tubes are again seen. Persistent diffuse pulmonary  opacities. No pneumothorax. Cardiomediastinal silhouette is obscured. No definite pleural effusion. IMPRESSION: SVC ECMO cannula may have been slightly retracted. Otherwise stable lines and tubes. Persistent diffuse pulmonary opacities. Electronically Signed   By: Macy Mis M.D.   On: 09/03/2019 09:48   DG Chest Port 1 View  Result Date: 09/03/2019 CLINICAL DATA:  ETT, ECMO, low saturations EXAM: PORTABLE CHEST 1 VIEW COMPARISON:  Radiographs 09/02/2019, 09/01/2019 FINDINGS: Large bore central venous catheters are present in the SVC and IVC in stable position to comparison. A transesophageal tube terminates below the level of imaging. An endotracheal tube terminates in the mid trachea, 4.2 cm from the carina. A left subclavian venous catheter tip terminates at the brachiocephalic-caval confluence. Bilateral chest tubes remain in place. Diffuse bilateral airspace opacities with persistently low lung volumes are grossly similar to comparison. Much of the cardiac silhouette is obscured by overlying opacity. Small effusion may be present on the right. No convincing left effusion. No pneumothorax. IMPRESSION: 1. Lines and tubes in stable position 2. Diffuse bilateral airspace opacities with low lung volumes are grossly similar to comparison. 3. Possible trace right effusion Electronically Signed   By: Lovena Le M.D.   On: 09/03/2019 05:36   DG Chest Port 1 View  Result Date: 09/02/2019 CLINICAL DATA:  History of ECMO EXAM: PORTABLE CHEST 1 VIEW COMPARISON:  Radiograph 09/01/2019 FINDINGS: Tracheostomy tube tip terminates in the mid trachea. A transesophageal feeding tube tip terminates in the upper abdomen likely terminating near the 3rd to 4th portion of the duodenum. Large bore central venous catheter sheath, likely ECMO cannulae terminate over the superior cavoatrial junction and inferior cavoatrial junction. Bilateral chest tubes in place. Left subclavian central venous catheter tip terminates at the  brachiocephalic-caval confluence. Right upper extremity PICC terminates at the level of the right atrium. Lung volumes remain low, somewhat diminished in right with dense bilateral airspace opacities and extensive air bronchograms again seen throughout both lungs. Suspect at least small right pleural effusion. No clear left effusion. No visible pneumothorax. No acute osseous or soft tissue abnormality. IMPRESSION: 1. Lines and tubes as above. 2. Persistent low lung volumes, diminishing in the right, with dense bilateral airspace opacities and extensive air bronchograms throughout both lungs. 3. Suspect at least small right pleural effusion. Electronically Signed   By: Lovena Le M.D.   On: 09/02/2019 05:07     PHYSICAL EXAM: Limited packing placed by critical care physician.  No active bleeding at this time.   Assessment/Plan: Patient's critical situation discussed with critical care physician team.  Patient underwent endoscopic nasal cautery at the bedside with Dr. Redmond Baseman 3 days ago.  No specific bleeding site but diffuse mucosal trauma.  Patient on maximal anticoagulation with current ECMO treatment.  Patient stable at this time, limited packing in place and no significant active bleeding.  Patient using slightly as expected.  Recommend avoiding any additional implementation at this time, monitor for additional concerns and monitor along with critical care.  Please reconsult as needed if significant additional bleeding.    Jerrell Belfast 09/03/2019, 11:48 AM

## 2019-09-03 NOTE — Progress Notes (Signed)
Patient had significant nosebleed for the last 24hrs. Dressing saturates with blood requiring change every hour. Hemoglobin is stable this morning

## 2019-09-03 NOTE — Progress Notes (Signed)
3 Days Post-Op Procedure(s) (LRB): NASAL ENDOSCOPY WITH EPISTAXIS CONTROL WITH CAUTERIZATION (N/A) Subjective: Episode of recirculation last pm when in standing position R  IJ drainage cannula pulled back under sterile prep, drape,gown CXR now shows distance between drainage and reinfusion cannulas at 8.5 cm with good color difference  Objective: Vital signs in last 24 hours: Temp:  [98.1 F (36.7 C)-98.6 F (37 C)] 98.1 F (36.7 C) (01/26 0800) Pulse Rate:  [70-101] 71 (01/26 0815) Cardiac Rhythm: Normal sinus rhythm (01/26 0800) Resp:  [12-23] 18 (01/26 0815) BP: (114)/(39) 114/39 (01/26 0337) SpO2:  [83 %-100 %] 100 % (01/26 0815) Arterial Line BP: (108-198)/(37-69) 119/48 (01/26 0815) FiO2 (%):  [60 %-100 %] 100 % (01/26 0800) Weight:  [145.9 kg] 145.9 kg (01/26 0600)  Hemodynamic parameters for last 24 hours: CVP:  [8 mmHg-13 mmHg] 10 mmHg  Intake/Output from previous day: 01/25 0701 - 01/26 0700 In: 4875.4 [I.V.:2770.4; NG/GT:1755; IV Piggyback:350] Out: 4780 [Urine:4010; Stool:650; Chest Tube:120] Intake/Output this shift: Total I/O In: 189.8 [I.V.:114.8; NG/GT:75] Out: 270 [Urine:250; Chest Tube:20]  EXAM Cannula sites clean, dry L nasal bleeding continues Mild increase in edema Minimal return on bronch today  Lab Results: Recent Labs    09/02/19 1656 09/02/19 1702 09/03/19 0448 09/03/19 0724  WBC 10.7*  --  10.7*  --   HGB 8.8*   < > 8.7* 9.2*  HCT 29.3*   < > 29.2* 27.0*  PLT 144*  --  144*  --    < > = values in this interval not displayed.   BMET:  Recent Labs    09/02/19 0443 09/02/19 0627 09/03/19 0448 09/03/19 0724  NA 145   < > 145 145  K 4.7   < > 4.0 3.7  CL 106  --  105  --   CO2 28  --  28  --   GLUCOSE 160*  --  160*  --   BUN 37*  --  44*  --   CREATININE 0.64  --  0.79  --   CALCIUM 9.3  --  9.4  --    < > = values in this interval not displayed.    PT/INR: No results for input(s): LABPROT, INR in the last 72 hours. ABG     Component Value Date/Time   PHART 7.429 09/03/2019 0724   HCO3 31.4 (H) 09/03/2019 0724   TCO2 33 (H) 09/03/2019 0724   ACIDBASEDEF 2.0 08/28/2019 1519   O2SAT 96.0 09/03/2019 0724   CBG (last 3)  Recent Labs    09/02/19 2322 09/03/19 0444 09/03/19 0727  GLUCAP 159* 144* 138*    Assessment/Plan: S/P Procedure(s) (LRB): NASAL ENDOSCOPY WITH EPISTAXIS CONTROL WITH CAUTERIZATION (N/A) Very slow improvement in lung recovery, needs hi sweep > 10L for CO2 removal but lung compliance showing improvement  Cont VV ECMO with 2 drainage cannulas and monitor for further recirculation  LOS: 43 days    [name] 09/03/2019

## 2019-09-03 NOTE — Progress Notes (Signed)
Pt having signs of recirculation with low Sats 84% and high SvO2 on cardiohelp at 85.5 (prior SvO2 measurements were in the 60's-70's). Definite mixed coloring noted on IJ drain cannula. This was also confirmed by increasing RPM's resulting in lowered sats. Lowering RPMs on cardiohelp eventually resulted in improved sats up to 94%. HOB also lowered to assist in decreased recirculation.   The above information was relayed to ECMO coordinator and will be discussed in AM rounds. Currently, no need for xray to confirm positioning or albumin for increased flow requirements. ES is continuing to monitor closely.

## 2019-09-03 NOTE — Plan of Care (Signed)
  Problem: Respiratory: Goal: Will maintain a patent airway Outcome: Progressing

## 2019-09-03 NOTE — Progress Notes (Signed)
ANTICOAGULATION CONSULT NOTE - Follow Up Consult  Pharmacy Consult for heparin Indication: ECMO  Patient Measurements: Height: 5' 8"  (172.7 cm) Weight: (!) 328 lb 7.8 oz (149 kg) IBW/kg (Calculated) : 68.4 Heparin Dosing Weight: 104.5kg  Vital Signs: Temp: 97.5 F (36.4 C) (01/21 1600) Temp Source: Axillary (01/21 1600) Pulse Rate: 87 (01/21 1700)  Labs: Recent Labs    09/01/19 0414 09/01/19 0425 09/02/19 0443 09/02/19 0627 09/02/19 1656 09/02/19 1702 09/03/19 0448 09/03/19 0448 09/03/19 0724 09/03/19 0724 09/03/19 1617 09/03/19 1618  HGB 8.6*   < > 8.5*   < > 8.8*   < > 8.7*   < > 9.2*   < > 10.5* 9.1*  HCT 28.5*   < > 29.2*   < > 29.3*   < > 29.2*   < > 27.0*  --  31.0* 30.3*  PLT 122*   < > 128*   < > 144*  --  144*  --   --   --   --  174  APTT 149*   < > 132*   < > 108*  --  98*  --   --   --   --  88*  HEPARINUNFRC 0.31   < > 0.41   < > 0.36  --  0.37  --   --   --   --  0.44  CREATININE 0.55*  --  0.64  --   --   --  0.79  --   --   --   --   --    < > = values in this interval not displayed.    Assessment: 44yo male on VV ECMO on heparin infusion.  -Heparin level  therapeutic at 0.44, aPTT at 88, on 2600 units/hr.Still having nosebleeds, remained packed.   Goal of Therapy:  Heparin level 0.3-0.5 units/ml   Plan:  Continue IV heparin at 2600 units/hr. Monitor heparin level and CBC 5AM/5PM, daily LDH Monitor for any increase in bleeding from mouth  Hildred Laser, PharmD Clinical Pharmacist **Pharmacist phone directory can now be found on [email or web address] ([email or web address]).  Listed under [place].

## 2019-09-03 NOTE — Procedures (Signed)
Bronchoscopy Procedure Note CHASEN MENDELL [id number] Mar 11, 1976  Procedure: Bronchoscopy Indications: Remove secretions  Procedure Details Consent: Risks of procedure as well as the alternatives and risks of each were explained to the (patient/caregiver).  Consent for procedure obtained. Time Out: Verified patient identification, verified procedure, site/side was marked, verified correct patient position, special equipment/implants available, medications/allergies/relevent history reviewed, required imaging and test results available.  Performed  In preparation for procedure, patient was given 100% FiO2 and bronchoscope lubricated. Sedation: Benzodiazepines, Muscle relaxants and Narcotics  Airway entered and the following bronchi were examined: RUL, RML, RLL, LUL, LLL and Bronchi.   Procedures performed: minimal blood tinged secretions in right mainstem. Bronchoscope removed.    Evaluation Hemodynamic Status: Transient hypertension requiring treatment; O2 sats: stable throughout Patient's Current Condition: stable Specimens:  None Complications: No apparent complications Patient did tolerate procedure well.   Einar Grad Yliana Gravois 09/03/2019

## 2019-09-03 NOTE — Progress Notes (Signed)
PVT pulled back RIJ ECMO cannula 3cm. Will continue to monitor.

## 2019-09-03 NOTE — Progress Notes (Signed)
Chest PT not done this round. Pt is having procedure in room at this time. Will continue next rounds.

## 2019-09-03 NOTE — Progress Notes (Signed)
ANTICOAGULATION CONSULT NOTE - Follow Up Consult  Pharmacy Consult for heparin Indication: ECMO  Patient Measurements: Height: 5' 8"  (172.7 cm) Weight: (!) 328 lb 7.8 oz (149 kg) IBW/kg (Calculated) : 68.4 Heparin Dosing Weight: 104.5kg  Vital Signs: Temp: 97.5 F (36.4 C) (01/21 1600) Temp Source: Axillary (01/21 1600) Pulse Rate: 87 (01/21 1700)  Labs: Recent Labs    09/01/19 0414 09/01/19 0425 09/02/19 0443 09/02/19 0627 09/02/19 1656 09/02/19 1702 09/03/19 0445 09/03/19 0445 09/03/19 0448 09/03/19 0724  HGB 8.6*   < > 8.5*   < > 8.8*   < > 8.8*   < > 8.7* 9.2*  HCT 28.5*   < > 29.2*   < > 29.3*   < > 26.0*  --  29.2* 27.0*  PLT 122*   < > 128*  --  144*  --   --   --  144*  --   APTT 149*   < > 132*  --  108*  --   --   --  98*  --   HEPARINUNFRC 0.31   < > 0.41  --  0.36  --   --   --  0.37  --   CREATININE 0.55*  --  0.64  --   --   --   --   --  0.79  --    < > = values in this interval not displayed.    Assessment: 44yo male on VV ECMO on heparin infusion.   Heparin level  therapeutic at 0.37, aPTT at 98, on 2600 units/hr. Hgb 9.2, plt 144. No infusion issues. Still having nosebleeds, remained packed.   Goal of Therapy:  Heparin level 0.3-0.5 units/ml   Plan:  Continue IV heparin at 2600 units/hr. Monitor heparin level and CBC 5AM/5PM, daily LDH Monitor for any increase in bleeding from mouth  [place] Crane, Roylene Reason, [place] Pharmacist Phone [phone]  09/03/2019 2:18 PM

## 2019-09-03 NOTE — Progress Notes (Signed)
Patient ID: [name], male   DOB: 06-Aug-1976, 44 y.o.   MRN: [id number]    Advanced Heart Failure Rounding Note   Subjective:    Circuit changed and trach exchanged on 1/8.   Underwent addition of a RIJ drainage cannula on 1/20 Circuit changed on 1/22 for rising LDH  Remains on VV ecmo with good flows.   Had evidence of recirculation last night via the RIJ return cannula.  Now on 100%  ABG 7.43/47/82/96%  Co-ox 70%   CXR: Diffuse bilateral lung opacities - no change. Personally reviewed   On vent 100% (up from 70%) PEEP 15. PlatP 30, Bi-vent setting.TVs ~150cc Personally reviewed  hgb 8.7 LDH 480 -> 445 -> 432  ECMO parameters Flow 4.92 RPM 3650 dP 32 Pressure -81   Objective:   Weight Range:  Vital Signs:   Temp:  [98.1 F (36.7 C)-98.6 F (37 C)] 98.1 F (36.7 C) (01/26 0800) Pulse Rate:  [70-101] 71 (01/26 0815) Resp:  [12-23] 18 (01/26 0815) BP: (114)/(39) 114/39 (01/26 0337) SpO2:  [83 %-100 %] 100 % (01/26 0815) Arterial Line BP: (108-198)/(37-69) 119/48 (01/26 0815) FiO2 (%):  [60 %-100 %] 100 % (01/26 0800) Weight:  [145.9 kg] 145.9 kg (01/26 0600) Last BM Date: (P) 09/02/19  Weight change: Filed Weights   09/01/19 0600 09/02/19 0100 09/03/19 0600  Weight: (!) 142.7 kg (!) 147 kg (!) 145.9 kg    Intake/Output:   Intake/Output Summary (Last 24 hours) at 09/03/2019 0917 Last data filed at 09/03/2019 0800 Gross per 24 hour  Intake 4684.68 ml  Output 4850 ml  Net -165.32 ml     Physical Exam: General: Sedated on vent. Bed inclined HEENT: normal + nasal packing + oozing Neck: supple. RIJ cannula + trach  Carotids 2+ bilat; no bruits. No lymphadenopathy or thryomegaly appreciated. Cor: PMI nondisplaced. Regular rate & rhythm. No rubs, gallops or murmurs. Lungs: minimal air movement  Abdomen: obese soft, nontender, nondistended. No hepatosplenomegaly. No bruits or masses. Good bowel sounds. Extremities: no cyanosis, clubbing, rash, 2+  edema Neuro: sedated   Telemetry: Sinus 70-80s Personally reviewed  Labs: Basic Metabolic Panel: Recent Labs  Lab 08/31/19 0404 08/31/19 0421 08/31/19 1150 08/31/19 1150 08/31/19 1630 08/31/19 1638 09/01/19 0414 09/01/19 0425 09/02/19 0443 09/02/19 0627 09/02/19 1702 09/02/19 1958 09/03/19 0445 09/03/19 0448 09/03/19 0724  NA 143   < > 145   < > 146*   < > 145   < > 145   < > 145 146* 145 145 145  K 3.6   < > 3.6   < > 4.4   < > 4.0   < > 4.7   < > 3.9 4.0 4.1 4.0 3.7  CL 102   < > 104  --  107  --  106  --  106  --   --   --   --  105  --   CO2 29   < > 29  --  28  --  28  --  28  --   --   --   --  28  --   GLUCOSE 192*   < > 191*  --  181*  --  177*  --  160*  --   --   --   --  160*  --   BUN 33*   < > 31*  --  31*  --  35*  --  37*  --   --   --   --  44*  --   CREATININE 0.74   < > 0.65  --  0.68  --  0.55*  --  0.64  --   --   --   --  0.79  --   CALCIUM 8.6*   < > 8.6*   < > 8.8*   < > 9.0  --  9.3  --   --   --   --  9.4  --   MG 1.5*  --  2.3  --   --   --  2.3  --   --   --   --   --   --   --   --   PHOS  --   --   --   --   --   --  5.4*  --   --   --   --   --   --   --   --    < > = values in this interval not displayed.    Liver Function Tests: Recent Labs  Lab 08/31/19 1150  AST 24  ALT 26  ALKPHOS 75  BILITOT 0.7  PROT 6.2*  ALBUMIN 2.2*   No results for input(s): LIPASE, AMYLASE in the last 168 hours. No results for input(s): AMMONIA in the last 168 hours.  CBC: Recent Labs  Lab 08/28/19 0406 08/28/19 0418 08/29/19 0355 08/29/19 0403 08/30/19 0427 08/30/19 0500 09/01/19 0414 09/01/19 0425 09/01/19 1623 09/01/19 1629 09/02/19 0443 09/02/19 0627 09/02/19 1656 09/02/19 1656 09/02/19 1702 09/02/19 1958 09/03/19 0445 09/03/19 0448 09/03/19 0724  WBC 8.5   < > 12.5*   < > 11.4*   < > 9.1  --  8.3  --  10.2  --  10.7*  --   --   --   --  10.7*  --   NEUTROABS 3.6  --  5.5  --  5.5  --   --   --   --   --   --   --   --   --   --    --   --   --   --   HGB 8.6*   < > 9.3*   < > 9.9*   < > 8.6*   < > 8.4*   < > 8.5*   < > 8.8*   < > 9.2* 9.2* 8.8* 8.7* 9.2*  HCT 28.7*   < > 31.0*   < > 31.8*   < > 28.5*   < > 28.1*   < > 29.2*   < > 29.3*   < > 27.0* 27.0* 26.0* 29.2* 27.0*  MCV 95.7   < > 93.7   < > 92.4   < > 94.4  --  95.6  --  97.7  --  96.4  --   --   --   --  96.1  --   PLT 101*   < > 108*   < > 109*   < > 122*  --  124*  --  128*  --  144*  --   --   --   --  144*  --    < > = values in this interval not displayed.    Cardiac Enzymes: No results for input(s): CKTOTAL, CKMB, CKMBINDEX, TROPONINI in the last 168 hours.  BNP: BNP (last 3 results) Recent Labs    07/22/19 1039  BNP 15.3    ProBNP (last 3 results)  No results for input(s): PROBNP in the last 8760 hours.    Other results:  Imaging: DG Chest Port 1 View  Result Date: 09/03/2019 CLINICAL DATA:  ETT, ECMO, low saturations EXAM: PORTABLE CHEST 1 VIEW COMPARISON:  Radiographs 09/02/2019, 09/01/2019 FINDINGS: Large bore central venous catheters are present in the SVC and IVC in stable position to comparison. A transesophageal tube terminates below the level of imaging. An endotracheal tube terminates in the mid trachea, 4.2 cm from the carina. A left subclavian venous catheter tip terminates at the brachiocephalic-caval confluence. Bilateral chest tubes remain in place. Diffuse bilateral airspace opacities with persistently low lung volumes are grossly similar to comparison. Much of the cardiac silhouette is obscured by overlying opacity. Small effusion may be present on the right. No convincing left effusion. No pneumothorax. IMPRESSION: 1. Lines and tubes in stable position 2. Diffuse bilateral airspace opacities with low lung volumes are grossly similar to comparison. 3. Possible trace right effusion Electronically Signed   By: Lovena Le M.D.   On: 09/03/2019 05:36   DG Chest Port 1 View  Result Date: 09/02/2019 CLINICAL DATA:  History of ECMO  EXAM: PORTABLE CHEST 1 VIEW COMPARISON:  Radiograph 09/01/2019 FINDINGS: Tracheostomy tube tip terminates in the mid trachea. A transesophageal feeding tube tip terminates in the upper abdomen likely terminating near the 3rd to 4th portion of the duodenum. Large bore central venous catheter sheath, likely ECMO cannulae terminate over the superior cavoatrial junction and inferior cavoatrial junction. Bilateral chest tubes in place. Left subclavian central venous catheter tip terminates at the brachiocephalic-caval confluence. Right upper extremity PICC terminates at the level of the right atrium. Lung volumes remain low, somewhat diminished in right with dense bilateral airspace opacities and extensive air bronchograms again seen throughout both lungs. Suspect at least small right pleural effusion. No clear left effusion. No visible pneumothorax. No acute osseous or soft tissue abnormality. IMPRESSION: 1. Lines and tubes as above. 2. Persistent low lung volumes, diminishing in the right, with dense bilateral airspace opacities and extensive air bronchograms throughout both lungs. 3. Suspect at least small right pleural effusion. Electronically Signed   By: Lovena Le M.D.   On: 09/02/2019 05:07     Medications:     Scheduled Medications: . labetalol      . artificial tears  1 application Both Eyes O1R  . vitamin C  500 mg Per Tube Daily  . chlorhexidine gluconate (MEDLINE KIT)  15 mL Mouth Rinse BID  . Chlorhexidine Gluconate Cloth  6 each Topical Daily  . cloNIDine  0.1 mg Per Tube BID  . diazepam  20 mg Per Tube Q6H  . feeding supplement (PRO-STAT SUGAR FREE 64)  60 mL Per Tube BID  . gabapentin  450 mg Per Tube Q8H  . HYDROmorphone  8 mg Per Tube Q4H  . insulin aspart  3-9 Units Subcutaneous Q4H  . ipratropium-albuterol  3 mL Nebulization Q4H  . mouth rinse  15 mL Mouth Rinse 10 times per day  . montelukast  10 mg Per Tube QHS  . oxymetazoline  1 spray Each Nare BID  . pantoprazole sodium   40 mg Per Tube QHS  . propranolol  20 mg Per Tube TID  . sodium chloride flush  10-40 mL Intracatheter Q12H  . sodium chloride flush  10-40 mL Intracatheter Q12H  . Thrombi-Pad  1 each Topical Once  . Thrombi-Pad  1 each Topical Once  . zinc sulfate  220 mg Per Tube Daily  Infusions: . sodium chloride    . sodium chloride 10 mL/hr at 08/29/19 1714  . sodium chloride 10 mL/hr at 09/03/19 0800  . albumin human 12.5 g (09/03/19 0320)  . cefTRIAXone (ROCEPHIN)  IV Stopped (09/02/19 0947)  . dexmedetomidine (PRECEDEX) IV infusion 1.6 mcg/kg/hr (09/03/19 0800)  . dextrose    . feeding supplement (PIVOT 1.5 CAL) 1,000 mL (09/03/19 0610)  . furosemide (LASIX) infusion 8 mg/hr (09/03/19 0800)  . heparin 2,600 Units/hr (09/03/19 7741)  . HYDROmorphone 20 mg/hr (09/03/19 0800)  . midazolam (VERSED) infusion 7 mg/hr (09/03/19 0800)  . norepinephrine 2 mcg/min (09/03/19 0800)  . propofol (DIPRIVAN) infusion Stopped (08/31/19 0910)    PRN Medications: Place/Maintain arterial line **AND** sodium chloride, sodium chloride, sodium chloride, acetaminophen (TYLENOL) oral liquid 160 mg/5 mL, acetaminophen, albumin human, dextrose, hydrALAZINE, HYDROmorphone, magnesium hydroxide, metoprolol tartrate, midazolam, ondansetron (ZOFRAN) IV, senna-docusate, sodium chloride, sodium chloride flush, sodium chloride flush, sodium chloride flush   Assessment/Plan:    1. Acute hypoxic/hypercarbic respiratory failure due to COVID PNA: Remained markedly hypoxic despite full support. Intubated 12/26. S/p bilateral CTs 12/26 for pneumothoraces.  TEE on 12/26 LVEF 70% RV ok. VV ECMO begun 12/26. Tracheostomy 12/29.  COVID ARDS at this point. ECMO circuit changed 1/8 & 1/23 - ECMO parameters as above - RIJ venous drainage cannula added 1/20. Circuit changed 1/23 - Flows look good. Evidence of recirculation overnight via RIJ return cannula. Visualized today under echo. D/w Dr. PVT will pull IJ cannula back - Lung  compliance, TVs and oxygenation continue improve. Hopefully will continue with lung recovery. CXR worse. May need repeat bronch today. D/w CCM  - Still volume overload. Increase lasix gtt to 8 - Bronch today for nasal bleeding into lungs. - LDH 480 -> 412 -> 445 -> 432 Watch closely - Continue heparin. Discussed dosing with PharmD personally.  2. Bilateral PTX: Due to barotrauma.  - Driving pressure minimized at < 15 Stable. No change  3. COVID PNA: management of COVID infection per CCM. CXR with bilateral multifocal PNA progressing to likely ARDS.  - s/p 10 days of remdesivir - 12/16, 12/2 s/p tociluzimab - 12/17 s/p convalescent plasma - Decadron at 81m/daily completed 1/11 - Completed cefepime/vanc - On ceftriaxone for nasal packing  4. ID: Empiric coverage with vancomycin/cefepime initially for possible secondary bacterial PNA, course completed.   - treated with cefepime and vanc for + cx with staph epidimidis, MSSA and serratia (sensitivities ok). - Cover with ceftriaxone for nasal packing - Remains stable - Discussed dosing with PharmD personally.  5. Thrombocytopenia: Slow decrease in platelets, likely low level hemolysis + critical illness.  - Stable 128k-> 144k this am.  - On heparin. Treatment of epistaxis as abive  6. Anemia: Bleeding at trach site resolved but still with epistaxis - hgb 8.7  today - Transfuse hgb < 8  7. FEN - continue TFs via oral Cor-trak  D/w with ECMO team (ECMO coordinator/specialists, CCM, TCTS and pharmD) at bedside on multidisciplinary rounds. Continue to await ARDS recovery. Details as above.   Length of Stay: 419 CRITICAL CARE Performed by: BGlori Bickers Total critical care time: 40 minutes  Critical care time was exclusive of separately billable procedures and treating other patients.  Critical care was necessary to treat or prevent imminent or life-threatening deterioration.  Critical care was time spent personally by me  (independent of midlevel providers or residents) on the following activities: development of treatment plan with patient and/or surrogate as well as nursing,  discussions with consultants, evaluation of patient's response to treatment, examination of patient, obtaining history from patient or surrogate, ordering and performing treatments and interventions, ordering and review of laboratory studies, ordering and review of radiographic studies, pulse oximetry and re-evaluation of patient's condition.    Glori Bickers MD 09/03/2019, 9:17 AM  Advanced Heart Failure Team Pager [phone] (M-F; 7a - 4p)  Please contact [facility] for night-coverage after hours (4p -7a ) and weekends on [email or web address]

## 2019-09-04 ENCOUNTER — Inpatient Hospital Stay (HOSPITAL_COMMUNITY): Payer: Managed Care, Other (non HMO)

## 2019-09-04 DIAGNOSIS — J80 Acute respiratory distress syndrome: Secondary | ICD-10-CM

## 2019-09-04 LAB — CBC
HCT: 29 % — ABNORMAL LOW (ref 39.0–52.0)
HCT: 27 % — ABNORMAL LOW (ref 39.0–52.0)
Hemoglobin: 8.4 g/dL — ABNORMAL LOW (ref 13.0–17.0)
Hemoglobin: 8 g/dL — ABNORMAL LOW (ref 13.0–17.0)
MCH: 27.8 pg (ref 26.0–34.0)
MCH: 28.1 pg (ref 26.0–34.0)
MCHC: 29 g/dL — ABNORMAL LOW (ref 30.0–36.0)
MCHC: 29.6 g/dL — ABNORMAL LOW (ref 30.0–36.0)
MCV: 96 fL (ref 80.0–100.0)
MCV: 94.7 fL (ref 80.0–100.0)
Platelets: 170 10*3/uL (ref 150–400)
Platelets: 154 10*3/uL (ref 150–400)
RBC: 3.02 MIL/uL — ABNORMAL LOW (ref 4.22–5.81)
RBC: 2.85 MIL/uL — ABNORMAL LOW (ref 4.22–5.81)
RDW: 18.2 % — ABNORMAL HIGH (ref 11.5–15.5)
RDW: 18.3 % — ABNORMAL HIGH (ref 11.5–15.5)
WBC: 12.3 10*3/uL — ABNORMAL HIGH (ref 4.0–10.5)
WBC: 10.5 10*3/uL (ref 4.0–10.5)
nRBC: 0.6 % — ABNORMAL HIGH (ref 0.0–0.2)
nRBC: 1.1 % — ABNORMAL HIGH (ref 0.0–0.2)

## 2019-09-04 LAB — POCT I-STAT 7, (LYTES, BLD GAS, ICA,H+H)
Acid-Base Excess: 8 mmol/L — ABNORMAL HIGH (ref 0.0–2.0)
Acid-Base Excess: 5 mmol/L — ABNORMAL HIGH (ref 0.0–2.0)
Acid-Base Excess: 6 mmol/L — ABNORMAL HIGH (ref 0.0–2.0)
Bicarbonate: 32.3 mmol/L — ABNORMAL HIGH (ref 20.0–28.0)
Bicarbonate: 31.7 mmol/L — ABNORMAL HIGH (ref 20.0–28.0)
Bicarbonate: 31.9 mmol/L — ABNORMAL HIGH (ref 20.0–28.0)
Calcium, Ion: 1.23 mmol/L (ref 1.15–1.40)
Calcium, Ion: 1.25 mmol/L (ref 1.15–1.40)
Calcium, Ion: 1.26 mmol/L (ref 1.15–1.40)
HCT: 26 % — ABNORMAL LOW (ref 39.0–52.0)
HCT: 61 % — ABNORMAL HIGH (ref 39.0–52.0)
HCT: 28 % — ABNORMAL LOW (ref 39.0–52.0)
Hemoglobin: 8.8 g/dL — ABNORMAL LOW (ref 13.0–17.0)
Hemoglobin: 20.7 g/dL — ABNORMAL HIGH (ref 13.0–17.0)
Hemoglobin: 9.5 g/dL — ABNORMAL LOW (ref 13.0–17.0)
O2 Saturation: 88 %
O2 Saturation: 80 %
O2 Saturation: 84 %
Patient temperature: 36.9
Patient temperature: 36.8
Potassium: 3.6 mmol/L (ref 3.5–5.1)
Potassium: 3.6 mmol/L (ref 3.5–5.1)
Potassium: 3.7 mmol/L (ref 3.5–5.1)
Sodium: 146 mmol/L — ABNORMAL HIGH (ref 135–145)
Sodium: 147 mmol/L — ABNORMAL HIGH (ref 135–145)
Sodium: 147 mmol/L — ABNORMAL HIGH (ref 135–145)
TCO2: 34 mmol/L — ABNORMAL HIGH (ref 22–32)
TCO2: 33 mmol/L — ABNORMAL HIGH (ref 22–32)
TCO2: 33 mmol/L — ABNORMAL HIGH (ref 22–32)
pCO2 arterial: 45.2 mmHg (ref 32.0–48.0)
pCO2 arterial: 49.3 mmHg — ABNORMAL HIGH (ref 32.0–48.0)
pCO2 arterial: 50.6 mmHg — ABNORMAL HIGH (ref 32.0–48.0)
pH, Arterial: 7.462 — ABNORMAL HIGH (ref 7.350–7.450)
pH, Arterial: 7.416 (ref 7.350–7.450)
pH, Arterial: 7.406 (ref 7.350–7.450)
pO2, Arterial: 52 mmHg — ABNORMAL LOW (ref 83.0–108.0)
pO2, Arterial: 44 mmHg — ABNORMAL LOW (ref 83.0–108.0)
pO2, Arterial: 49 mmHg — ABNORMAL LOW (ref 83.0–108.0)

## 2019-09-04 LAB — COOXEMETRY PANEL
Carboxyhemoglobin: 3.5 % — ABNORMAL HIGH (ref 0.5–1.5)
Methemoglobin: 1.1 % (ref 0.0–1.5)
O2 Saturation: 60.5 %
Total hemoglobin: 8.5 g/dL — ABNORMAL LOW (ref 12.0–16.0)

## 2019-09-04 LAB — BASIC METABOLIC PANEL
Anion gap: 12 (ref 5–15)
Anion gap: 11 (ref 5–15)
BUN: 46 mg/dL — ABNORMAL HIGH (ref 6–20)
BUN: 52 mg/dL — ABNORMAL HIGH (ref 6–20)
CO2: 30 mmol/L (ref 22–32)
CO2: 30 mmol/L (ref 22–32)
Calcium: 9.3 mg/dL (ref 8.9–10.3)
Calcium: 8.8 mg/dL — ABNORMAL LOW (ref 8.9–10.3)
Chloride: 102 mmol/L (ref 98–111)
Chloride: 105 mmol/L (ref 98–111)
Creatinine, Ser: 0.75 mg/dL (ref 0.61–1.24)
Creatinine, Ser: 0.66 mg/dL (ref 0.61–1.24)
GFR calc Af Amer: >60 mL/min (ref >60)
GFR calc Af Amer: >60 mL/min (ref >60)
GFR calc non Af Amer: >60 mL/min (ref >60)
GFR calc non Af Amer: >60 mL/min (ref >60)
Glucose, Bld: 151 mg/dL — ABNORMAL HIGH (ref 70–99)
Glucose, Bld: 186 mg/dL — ABNORMAL HIGH (ref 70–99)
Potassium: 3.5 mmol/L (ref 3.5–5.1)
Potassium: 3.3 mmol/L — ABNORMAL LOW (ref 3.5–5.1)
Sodium: 144 mmol/L (ref 135–145)
Sodium: 146 mmol/L — ABNORMAL HIGH (ref 135–145)

## 2019-09-04 LAB — HEPARIN LEVEL (UNFRACTIONATED)
Heparin Unfractionated: 0.36 IU/mL (ref 0.30–0.70)
Heparin Unfractionated: 0.41 IU/mL (ref 0.30–0.70)

## 2019-09-04 LAB — GLUCOSE, CAPILLARY
Glucose-Capillary: 136 mg/dL — ABNORMAL HIGH (ref 70–99)
Glucose-Capillary: 174 mg/dL — ABNORMAL HIGH (ref 70–99)
Glucose-Capillary: 149 mg/dL — ABNORMAL HIGH (ref 70–99)
Glucose-Capillary: 174 mg/dL — ABNORMAL HIGH (ref 70–99)

## 2019-09-04 LAB — APTT
aPTT: 98 seconds — ABNORMAL HIGH (ref 24–36)
aPTT: 87 seconds — ABNORMAL HIGH (ref 24–36)

## 2019-09-04 LAB — LACTATE DEHYDROGENASE: LDH: 450 U/L — ABNORMAL HIGH (ref 98–192)

## 2019-09-04 LAB — LACTIC ACID, PLASMA: Lactic Acid, Venous: 1.1 mmol/L (ref 0.5–1.9)

## 2019-09-04 MED ORDER — FLUCONAZOLE 200 MG PO TABS
200.0000 mg | ORAL_TABLET | Freq: Once | ORAL | Status: DC
  Filled 2019-09-04: qty 1

## 2019-09-04 MED ORDER — ROCURONIUM BROMIDE 10 MG/ML (PF) SYRINGE
PREFILLED_SYRINGE | INTRAVENOUS | Status: AC
  Administered 2019-09-04: 19:00:00 20 mg
  Filled 2019-09-04: qty 10

## 2019-09-04 MED ORDER — POTASSIUM CHLORIDE 20 MEQ/15ML (10%) PO SOLN
40.0000 meq | ORAL | Status: AC
  Administered 2019-09-04 (×2): 40 meq
  Filled 2019-09-04 (×2): qty 30

## 2019-09-04 MED ORDER — POTASSIUM CHLORIDE 20 MEQ/15ML (10%) PO SOLN: 20.0000 meq | ORAL | Status: DC

## 2019-09-04 MED ORDER — ROCURONIUM BROMIDE 50 MG/5ML IV SOLN
20.0000 mg | Freq: Once | INTRAVENOUS | Status: AC
  Filled 2019-09-04: qty 2

## 2019-09-04 MED ORDER — HYDROMORPHONE HCL 2 MG PO TABS
12.0000 mg | ORAL_TABLET | ORAL | Status: DC
  Administered 2019-09-04: 6 mg
  Administered 2019-09-04 – 2019-09-09 (×29): 12 mg
  Filled 2019-09-04 (×30): qty 6

## 2019-09-04 MED ORDER — ROCURONIUM BROMIDE 50 MG/5ML IV SOLN
50.0000 mg | Freq: Once | INTRAVENOUS | Status: AC
  Filled 2019-09-04: qty 5

## 2019-09-04 MED ORDER — SODIUM CHLORIDE 0.9 % IV SOLN
1.0000 mg/h | INTRAVENOUS | Status: DC
  Administered 2019-09-04 – 2019-09-05 (×2): 10 mg/h via INTRAVENOUS
  Filled 2019-09-04 (×2): qty 50

## 2019-09-04 MED ORDER — ROCURONIUM BROMIDE 10 MG/ML (PF) SYRINGE
PREFILLED_SYRINGE | INTRAVENOUS | Status: AC
  Administered 2019-09-04: 50 mg
  Filled 2019-09-04: qty 10

## 2019-09-04 MED ORDER — POTASSIUM CHLORIDE 20 MEQ/15ML (10%) PO SOLN
40.0000 meq | Freq: Once | ORAL | Status: AC
  Administered 2019-09-04: 40 meq
  Filled 2019-09-04: qty 30

## 2019-09-04 NOTE — Progress Notes (Addendum)
Dr Lynetta Mare performed bedside bronch. Pt pre oxygenated on 100% prior to start of procedure.  S/p procedure, noted sat 79-80%.  Pt remains on 100% fio2 s/p procedure d/t sat.  RN aware.    CPT held at this time d/t s/p bronch and desat currently.

## 2019-09-04 NOTE — CV Procedure (Addendum)
ECMO NOTE:  Indication: Respiratory failure due to COVID PNA  Initial cannulation date: 08/03/2019  ECMO type: VV ECMO (Cardio-Help)  Return cannula:  1) 21 FR RFV in RA  Inflow Cannulas:   1) 19 FR RIJ 2) 25 FR  LFV  ECMO events:  Initial cannulation 12/26 Circuit changed and trach exchanged on 1/8.   Underwent addition of a RIJ drainage cannula on 1/20 Circuit changed on 1/22 for rising LDH Had evidence of recirculation and RIJ cannula pulled back on 1/26  Daily data:  Remains on VV ecmo with good flows.   Flow 4.75 RPM 3650 dP 35 Pven  -76 Sweep 14  SVO2 56.7%  Labs:  Hgb 8.6 LDH 450 Heparin level: 0.36   Glori Bickers, MD  3:57 PM

## 2019-09-04 NOTE — Progress Notes (Signed)
ANTICOAGULATION CONSULT NOTE - Follow Up Consult  Pharmacy Consult for heparin Indication: ECMO  Patient Measurements: Height: 5' 8"  (172.7 cm) Weight: (!) 328 lb 7.8 oz (149 kg) IBW/kg (Calculated) : 68.4 Heparin Dosing Weight: 104.5kg  Vital Signs: Temp: 97.5 F (36.4 C) (01/21 1600) Temp Source: Axillary (01/21 1600) Pulse Rate: 87 (01/21 1700)  Labs: Recent Labs    09/02/19 0443 09/02/19 2297 09/03/19 0448 09/03/19 0724 09/03/19 1618 09/03/19 2015 09/04/19 0416 09/04/19 0416 09/04/19 0420 09/04/19 0834  HGB 8.5*   < > 8.7*   < > 9.1*   < > 8.4*   < > 8.8* 20.7*  HCT 29.2*   < > 29.2*   < > 30.3*   < > 29.0*  --  26.0* 61.0*  PLT 128*   < > 144*  --  174  --  170  --   --   --   APTT 132*   < > 98*  --  88*  --  98*  --   --   --   HEPARINUNFRC 0.41   < > 0.37  --  0.44  --  0.36  --   --   --   CREATININE 0.64  --  0.79  --   --   --  0.75  --   --   --    < > = values in this interval not displayed.    Assessment: 44yo male on VV ECMO on heparin infusion.  -Heparin level therapeutic at 0.36, aPTT at 98, on 2600 units/hr. Nosebleed appears to have resolved, remained packed.   Goal of Therapy:  Heparin level 0.3-0.5 units/ml   Plan:  Continue IV heparin at 2600 units/hr. Monitor heparin level and CBC 5AM/5PM, daily LDH Monitor for any increase in bleeding from mouth  Berenice Bouton, PharmD PGY1 Pharmacy Resident  Please check AMION for all [facility] phone numbers After 10:00 PM, call [place] [phone]  09/04/2019  10:23 AM

## 2019-09-04 NOTE — Progress Notes (Signed)
Daily Progress Note   Patient Name: [name]       Date: 09/04/2019 DOB: [birth year]  Age: 44 y.o. MRN#: [id number] Attending Physician: Wonda Olds, MD Primary Care Physician: Tamsen Roers, MD Admit Date: 07/22/2019  Reason for Consultation/Follow-up: Establishing goals of care and Psychosocial/spiritual support  Subjective: Patient continues on ECMO awaiting resolution of post COVID ARDS. Per Dr. Lynetta Mare- overall trajectory in the past few days has been positive.  Called patient's wife- [name]. Gave emotional support as she expressed the difficulties of taking over as one parent household. She continues to hope for patient's recovery and his return home. She expressed worry concerning the fact that if patient is able to recover from ARDS and off ECMO- she worries about the difficulty in taking care of him at discharge in addition to her current responsibilities. We discussed that he will likely need some amount of time in a rehab facility given his prolonged bedbound state. [name] remains realistic in her hopes for patient's recovery, but knows his state is still critical and tenuous.  We discussed his COVID status- [name] asked if patient will be tested again. I let her know that his covid has resolved and there is not a need for repeat testing. He is no longer considered infectious. Reviewed with her that his lungs are damaged from the bout of COVID- but he is not considered positive for COVID any longer.   Length of Stay: 44  Current Medications: Scheduled Meds:  . vitamin C  500 mg Per Tube Daily  . chlorhexidine gluconate (MEDLINE KIT)  15 mL Mouth Rinse BID  . Chlorhexidine Gluconate Cloth  6 each Topical Daily  . cloNIDine  0.3 mg Per Tube BID  . diazepam  20 mg Per Tube  Q6H  . feeding supplement (PRO-STAT SUGAR FREE 64)  60 mL Per Tube BID  . gabapentin  450 mg Per Tube Q8H  . HYDROmorphone  12 mg Per Tube Q4H  . insulin aspart  3-9 Units Subcutaneous Q4H  . ipratropium-albuterol  3 mL Nebulization Q4H  . mouth rinse  15 mL Mouth Rinse 10 times per day  . montelukast  10 mg Per Tube QHS  . oxymetazoline  1 spray Each Nare BID  . pantoprazole sodium  40 mg Per Tube QHS  . propranolol  20 mg Per Tube TID  . QUEtiapine  50 mg Per Tube BID  . sodium chloride flush  10-40 mL Intracatheter Q12H  . sodium chloride flush  10-40 mL Intracatheter Q12H  . Thrombi-Pad  1 each Topical Once  . Thrombi-Pad  1 each Topical Once  . zinc sulfate  220 mg Per Tube Daily    Continuous Infusions: . sodium chloride    . sodium chloride 10 mL/hr at 09/04/19 0300  . sodium chloride 10 mL/hr at 09/04/19 1300  . albumin human 12.5 g (09/03/19 0320)  . cefTRIAXone (ROCEPHIN)  IV Stopped (09/04/19 1244)  . dexmedetomidine (PRECEDEX) IV infusion 1.6 mcg/kg/hr (09/04/19 1530)  . dextrose    . feeding supplement (PIVOT 1.5 CAL) 1,000 mL (09/04/19 1240)  . furosemide (LASIX) infusion 8 mg/hr (09/04/19 1300)  . heparin 2,600 Units/hr (09/04/19 1300)  . HYDROmorphone 20 mg/hr (09/04/19 1300)  . midazolam (VERSED) infusion    . norepinephrine 9 mcg/min (09/04/19 1300)  . propofol (DIPRIVAN) infusion Stopped (08/31/19 0910)    PRN Meds: Place/Maintain arterial line **AND** sodium chloride, sodium chloride, sodium chloride, acetaminophen (TYLENOL) oral liquid 160 mg/5 mL, acetaminophen, albumin human, dextrose, hydrALAZINE, HYDROmorphone, magnesium hydroxide, midazolam, ondansetron (ZOFRAN) IV, senna-docusate, sodium chloride, sodium chloride flush, sodium chloride flush  Physical Exam          Vital Signs: BP (!) 183/69   Pulse 83   Temp 98.2 F (36.8 C) (Oral)   Resp (!) 27   Ht 5' 8"  (1.727 m)   Wt (!) 145.9 kg   SpO2 98%   BMI 48.91 kg/m  SpO2: SpO2: 98 % O2  Device: O2 Device: Ventilator O2 Flow Rate: O2 Flow Rate (L/min): 85 L/min  Intake/output summary:   Intake/Output Summary (Last 24 hours) at 09/04/2019 1608 Last data filed at 09/04/2019 1300 Gross per 24 hour  Intake 4291.06 ml  Output 4500 ml  Net -208.94 ml   LBM: Last BM Date: 09/03/19 Baseline Weight: Weight: 136.1 kg Most recent weight: Weight: (!) 145.9 kg       Palliative Assessment/Data: PPS: 10%   Flowsheet Rows     Most Recent Value  Intake Tab  Referral Department  Critical care  Unit at Time of Referral  ICU  Palliative Care Primary Diagnosis  Cardiac  Date Notified  08/08/19  Palliative Care Type  New Palliative care  Reason for referral  -- [ecmo]  Date of Admission  07/22/19  Date first seen by Palliative Care  08/09/19  # of days Palliative referral response time  1 Day(s)  # of days IP prior to Palliative referral  17  Clinical Assessment  Psychosocial & Spiritual Assessment  Palliative Care Outcomes      Patient Active Problem List   Diagnosis Date Noted  . Pressure injury of skin 08/22/2019  . Need for emotional support   . Chest tube in place   . Personal history of ECMO   . Advanced care planning/counseling discussion   . Acute respiratory distress syndrome (ARDS) due to COVID-19 virus ([place])   . Palliative care by specialist   . Goals of care, counseling/discussion   . Advanced directives, counseling/discussion   . Subcutaneous crepitus   . Pneumonia due to COVID-19 virus 07/23/2019  . Acute respiratory failure with hypoxia ([place]) 07/22/2019  . Persistent asthma with undetermined severity   . Thrombocytopenia ([place])   . Leukopenia   . Fever   . Gram positive bacterial infection   . Septic arthritis of  right acromioclavicular joint ([place]) 09/29/2017  . Chronic left-sided low back pain with left-sided sciatica 09/19/2017  . Epigastric pain   . Pancreatitis 04/17/2017  . Essential hypertension 04/17/2017  . Moderate persistent asthma  07/21/2015  . Allergic rhinoconjunctivitis 07/21/2015  . GERD (gastroesophageal reflux disease) 07/21/2015  . ABNORMALITY OF GAIT 11/12/2009  . TARSAL TUNNEL SYNDROME, LEFT 10/08/2009  . PES PLANUS 10/08/2009    Palliative Care Assessment & Plan   Patient Profile: 44 y/o M admitted 12/14 with COVIDpneumonia causing acutehypoxemic respiratory failure. Developed pneumomediastinum 12/23 with concern for possible bilateral small pneumothoraces on 12/24. PCCM consulted for evaluation of pneumothoraces.During admission patient has had placement of bilateral chest tubes, and is now on VV ECMO, and vent by trach. Recovery has been complicated by ongoing bleeding- frank blood in chest tubes, bleeding from around trach site, epistaxis- has received multiple blood transfusions, platelet transfusion. Also received albumin for volume expansion due to low flow after receiving diuresis- diuresis now on hold. Palliative medicine consulted for support and GOC.  Assessment/Recommendations/Plan  PMT will continue to follow and support holistically   Goals of Care and Additional Recommendations: Limitations on Scope of Treatment: Full Scope Treatment  Code Status: Full code  Prognosis:  Unable to determine  Discharge Planning: To Be Determined  Thank you for allowing the Palliative Medicine Team to assist in the care of this patient.   Time In: 1500 Time Out: 1545 Total Time 45 minutes Prolonged Time Billed no      Greater than 50%  of this time was spent counseling and coordinating care related to the above assessment and plan.  Mariana Kaufman, AGNP-C Palliative Medicine   Please contact Palliative Medicine Team phone at [phone] for questions and concerns.

## 2019-09-04 NOTE — Progress Notes (Addendum)
SLP Cancellation Note  Patient Details Name: [name] MRN: [id number] DOB: 07-27-1976   Cancelled treatment:       Reason Eval/Treat Not Completed: Patient continues not medically ready. Will sign off at this time. Please reconsult when pt is appropriate for PMSV or swallow evaluations. Consulted with RN.   Lenzie Sandler B. Quentin Ore, [facility], [place] Speech Language Pathologist Office: [phone] Pager: [phone]  Shonna Chock 09/04/2019, 10:26 AM

## 2019-09-04 NOTE — Progress Notes (Signed)
Patient ID: [name], male   DOB: 1976/01/28, 44 y.o.   MRN: [id number]    Advanced Heart Failure Rounding Note   Subjective:    Circuit changed and trach exchanged on 1/8.   Underwent addition of a RIJ drainage cannula on 1/20 Circuit changed on 1/22 for rising LDH Had evidence of recirculation and RIJ cannula pulled back on 1/26  Remains on VV ecmo with good flows.   Continues to have epistaxis. More hypoxemic overnight.   ABG 7.42/49/80/80%  Co-ox 60%   Remains on lasix gtt. Weight down 3 pounds but still up about 40 pounds  CXR: Diffuse bilateral lung opacities - no change. Personally reviewed   On vent now at 60% (down from 100%) PEEP 15. PlatP 30, Bi-vent setting.TVs ~150cc Personally reviewed  hgb 8.4 LDH 480 -> 445 -> 432-> 450   ECMO parameters - see separate ecmo note   Objective:   Weight Range:  Vital Signs:   Temp:  [98.2 F (36.8 C)-98.9 F (37.2 C)] 98.2 F (36.8 C) (01/26 2000) Pulse Rate:  [73-121] 83 (01/27 1540) Resp:  [0-27] 27 (01/27 1540) BP: (154-183)/(47-69) 183/69 (01/27 1501) SpO2:  [79 %-98 %] 98 % (01/27 1540) Arterial Line BP: (99-179)/(39-61) 116/56 (01/27 1300) FiO2 (%):  [60 %-100 %] 100 % (01/27 1540) Last BM Date: 09/03/19  Weight change: Filed Weights   09/01/19 0600 09/02/19 0100 09/03/19 0600  Weight: (!) 142.7 kg (!) 147 kg (!) 145.9 kg    Intake/Output:   Intake/Output Summary (Last 24 hours) at 09/04/2019 1544 Last data filed at 09/04/2019 1300 Gross per 24 hour  Intake 4492.79 ml  Output 5350 ml  Net -857.21 ml     Physical Exam: General: Sedated on vent. Bed inclined + anasarca HEENT: normal + nasal packing with oozing Neck: supple . + RIJ cannula Carotids 2+ bilat; no bruits. No lymphadenopathy or thryomegaly appreciated. Cor: PMI nondisplaced. Regular . No rubs, gallops or murmurs. Lungs: minimal air movement  Abdomen: obese soft, nontender, nondistended. No hepatosplenomegaly. No bruits or masses.  Good bowel sounds. Extremities: no cyanosis, clubbing, rash, 2-3+edema  + bilateral femoral venous cannulas  Neuro: sedated will arouse at times   Telemetry: Sinus 80s Personally reviewed  Labs: Basic Metabolic Panel: Recent Labs  Lab 08/31/19 0404 08/31/19 0421 08/31/19 1150 08/31/19 1150 08/31/19 1630 08/31/19 1638 09/01/19 0414 09/01/19 0425 09/02/19 0443 09/02/19 1443 09/03/19 0448 09/03/19 0724 09/03/19 1617 09/03/19 2015 09/04/19 0416 09/04/19 0420 09/04/19 0834  NA 143   < > 145   < > 146*   < > 145   < > 145   < > 145   < > 145 146* 144 146* 147*  K 3.6   < > 3.6   < > 4.4   < > 4.0   < > 4.7   < > 4.0   < > 3.5 3.5 3.5 3.6 3.6  CL 102   < > 104   < > 107  --  106  --  106  --  105  --   --   --  102  --   --   CO2 29   < > 29   < > 28  --  28  --  28  --  28  --   --   --  30  --   --   GLUCOSE 192*   < > 191*   < > 181*  --  177*  --  160*  --  160*  --   --   --  151*  --   --   BUN 33*   < > 31*   < > 31*  --  35*  --  37*  --  44*  --   --   --  46*  --   --   CREATININE 0.74   < > 0.65   < > 0.68  --  0.55*  --  0.64  --  0.79  --   --   --  0.75  --   --   CALCIUM 8.6*   < > 8.6*   < > 8.8*   < > 9.0   < > 9.3  --  9.4  --   --   --  9.3  --   --   MG 1.5*  --  2.3  --   --   --  2.3  --   --   --   --   --   --   --   --   --   --   PHOS  --   --   --   --   --   --  5.4*  --   --   --   --   --   --   --   --   --   --    < > = values in this interval not displayed.    Liver Function Tests: Recent Labs  Lab 08/31/19 1150  AST 24  ALT 26  ALKPHOS 75  BILITOT 0.7  PROT 6.2*  ALBUMIN 2.2*   No results for input(s): LIPASE, AMYLASE in the last 168 hours. No results for input(s): AMMONIA in the last 168 hours.  CBC: Recent Labs  Lab 08/29/19 0355 08/29/19 0403 08/30/19 0427 08/30/19 0500 09/02/19 0443 09/02/19 0627 09/02/19 1656 09/02/19 1702 09/03/19 0448 09/03/19 0724 09/03/19 1618 09/03/19 2015 09/04/19 0416 09/04/19 0420  09/04/19 0834  WBC 12.5*   < > 11.4*   < > 10.2  --  10.7*  --  10.7*  --  14.4*  --  12.3*  --   --   NEUTROABS 5.5  --  5.5  --   --   --   --   --   --   --   --   --   --   --   --   HGB 9.3*   < > 9.9*   < > 8.5*   < > 8.8*   < > 8.7*   < > 9.1* 9.2* 8.4* 8.8* 20.7*  HCT 31.0*   < > 31.8*   < > 29.2*   < > 29.3*   < > 29.2*   < > 30.3* 27.0* 29.0* 26.0* 61.0*  MCV 93.7   < > 92.4   < > 97.7  --  96.4  --  96.1  --  95.0  --  96.0  --   --   PLT 108*   < > 109*   < > 128*  --  144*  --  144*  --  174  --  170  --   --    < > = values in this interval not displayed.    Cardiac Enzymes: No results for input(s): CKTOTAL, CKMB, CKMBINDEX, TROPONINI in the last 168 hours.  BNP: BNP (last 3 results) Recent Labs    07/22/19 1039  BNP 15.3    ProBNP (last 3 results) No results for input(s): PROBNP in the last 8760 hours.    Other results:  Imaging: DG CHEST PORT 1 VIEW  Result Date: 09/04/2019 CLINICAL DATA:  Encounter for intubation EXAM: PORTABLE CHEST 1 VIEW COMPARISON:  Portable exam 0556 hours compared to 09/03/2019 FINDINGS: Tip of endotracheal tube projects 3.8 cm above carina. RIGHT jugular Cordis catheter tip projects over SVC. Feeding tube extends into stomach. Cardiac line tip projects over RIGHT atrium from infra diaphragmatic approach. BILATERAL thoracostomy tubes. RIGHT arm PICC line tip projects over SVC. Diffuse BILATERAL airspace infiltrates which could represent multifocal pneumonia or ARDS. No pleural effusion or pneumothorax. IMPRESSION: Lines and tubes as above. Persistent BILATERAL pulmonary infiltrates question multifocal pneumonia versus ARDS. Electronically Signed   By: Lavonia Dana M.D.   On: 09/04/2019 09:18   DG CHEST PORT 1 VIEW  Result Date: 09/03/2019 CLINICAL DATA:  SVC cannula repositioned EXAM: PORTABLE CHEST 1 VIEW COMPARISON:  Earlier same day FINDINGS: Right SVC ECMO cannula appears to have been slightly retracted. IVC cannula is similar. Enteric  tube passes into the stomach with tip out of field of view. Endotracheal tube is in similar position. Left subclavian line is unchanged. Bilateral chest tubes are again seen. Persistent diffuse pulmonary opacities. No pneumothorax. Cardiomediastinal silhouette is obscured. No definite pleural effusion. IMPRESSION: SVC ECMO cannula may have been slightly retracted. Otherwise stable lines and tubes. Persistent diffuse pulmonary opacities. Electronically Signed   By: Macy Mis M.D.   On: 09/03/2019 09:48   DG Chest Port 1 View  Result Date: 09/03/2019 CLINICAL DATA:  ETT, ECMO, low saturations EXAM: PORTABLE CHEST 1 VIEW COMPARISON:  Radiographs 09/02/2019, 09/01/2019 FINDINGS: Large bore central venous catheters are present in the SVC and IVC in stable position to comparison. A transesophageal tube terminates below the level of imaging. An endotracheal tube terminates in the mid trachea, 4.2 cm from the carina. A left subclavian venous catheter tip terminates at the brachiocephalic-caval confluence. Bilateral chest tubes remain in place. Diffuse bilateral airspace opacities with persistently low lung volumes are grossly similar to comparison. Much of the cardiac silhouette is obscured by overlying opacity. Small effusion may be present on the right. No convincing left effusion. No pneumothorax. IMPRESSION: 1. Lines and tubes in stable position 2. Diffuse bilateral airspace opacities with low lung volumes are grossly similar to comparison. 3. Possible trace right effusion Electronically Signed   By: Lovena Le M.D.   On: 09/03/2019 05:36     Medications:     Scheduled Medications: . vitamin C  500 mg Per Tube Daily  . chlorhexidine gluconate (MEDLINE KIT)  15 mL Mouth Rinse BID  . Chlorhexidine Gluconate Cloth  6 each Topical Daily  . cloNIDine  0.3 mg Per Tube BID  . diazepam  20 mg Per Tube Q6H  . feeding supplement (PRO-STAT SUGAR FREE 64)  60 mL Per Tube BID  . gabapentin  450 mg Per Tube  Q8H  . HYDROmorphone  12 mg Per Tube Q4H  . insulin aspart  3-9 Units Subcutaneous Q4H  . ipratropium-albuterol  3 mL Nebulization Q4H  . mouth rinse  15 mL Mouth Rinse 10 times per day  . montelukast  10 mg Per Tube QHS  . oxymetazoline  1 spray Each Nare BID  . pantoprazole sodium  40 mg Per Tube QHS  . propranolol  20 mg Per Tube TID  . QUEtiapine  50 mg Per Tube BID  . rocuronium  50 mg Intravenous Once  . rocuronium bromide      . sodium chloride flush  10-40 mL Intracatheter Q12H  . sodium chloride flush  10-40 mL Intracatheter Q12H  . Thrombi-Pad  1 each Topical Once  . Thrombi-Pad  1 each Topical Once  . zinc sulfate  220 mg Per Tube Daily    Infusions: . sodium chloride    . sodium chloride 10 mL/hr at 09/04/19 0300  . sodium chloride 10 mL/hr at 09/04/19 1300  . albumin human 12.5 g (09/03/19 0320)  . cefTRIAXone (ROCEPHIN)  IV Stopped (09/04/19 1244)  . dexmedetomidine (PRECEDEX) IV infusion 1.6 mcg/kg/hr (09/04/19 1530)  . dextrose    . feeding supplement (PIVOT 1.5 CAL) 1,000 mL (09/04/19 1240)  . furosemide (LASIX) infusion 8 mg/hr (09/04/19 1300)  . heparin 2,600 Units/hr (09/04/19 1300)  . HYDROmorphone 20 mg/hr (09/04/19 1300)  . midazolam (VERSED) infusion    . norepinephrine 9 mcg/min (09/04/19 1300)  . propofol (DIPRIVAN) infusion Stopped (08/31/19 0910)    PRN Medications: Place/Maintain arterial line **AND** sodium chloride, sodium chloride, sodium chloride, acetaminophen (TYLENOL) oral liquid 160 mg/5 mL, acetaminophen, albumin human, dextrose, hydrALAZINE, HYDROmorphone, magnesium hydroxide, midazolam, ondansetron (ZOFRAN) IV, senna-docusate, sodium chloride, sodium chloride flush, sodium chloride flush   Assessment/Plan:    1. Acute hypoxic/hypercarbic respiratory failure due to COVID PNA: Remained markedly hypoxic despite full support. Intubated 12/26. S/p bilateral CTs 12/26 for pneumothoraces.  TEE on 12/26 LVEF 70% RV ok. VV ECMO begun 12/26.  Tracheostomy 12/29.  COVID ARDS at this point. ECMO circuit changed 1/8 & 1/23 - ECMO parameters as per ECMO note - RIJ venous drainage cannula added 1/20. Circuit changed 1/23 - Flows look good. Evidence of recirculation overnight via RIJ return cannula. Cannula pulled back with some improvement - TVs improving now > 200 ml - Still volume overload. Continue lasix gtt at 8 - Epistaxis remain ongoing issue as well as increased secretions  - LDH 480 -> 412 -> 445 -> 432 -> 450 Watch closely - Continue heparin. Discussed dosing with PharmD personally. - wean sedation as tolerated.   2. Bilateral PTX: Due to barotrauma.  - Driving pressure minimized at < 15 Stable. No change  3. COVID PNA: management of COVID infection per CCM. CXR with bilateral multifocal PNA progressing to likely ARDS.  - s/p 10 days of remdesivir - 12/16, 12/2 s/p tociluzimab - 12/17 s/p convalescent plasma - Decadron at 62m/daily completed 1/11 - Completed cefepime/vanc - On ceftriaxone for nasal packing  4. ID: Empiric coverage with vancomycin/cefepime initially for possible secondary bacterial PNA, course completed.   - treated with cefepime and vanc for + cx with staph epidimidis, MSSA and serratia (sensitivities ok). - Cover with ceftriaxone for nasal packing - Remains stable - Discussed dosing with PharmD personally.  5. Thrombocytopenia: Slow decrease in platelets, likely low level hemolysis + critical illness.  - resolved  6. Anemia: Bleeding at trach site resolved but still with epistaxis - hgb 8.7  today - Transfuse hgb < 8  7. FEN - continue TFs via oral Cor-trak  D/w with ECMO team (ECMO coordinator/specialists, CCM, TCTS and pharmD) at bedside on multidisciplinary rounds. Continue to await ARDS recovery. Details as above.   Length of Stay: 463 CRITICAL CARE Performed by: BGlori Bickers Total critical care time: 35 minutes  Critical care time was exclusive of separately billable  procedures and treating other patients.  Critical care was necessary to treat or prevent imminent or life-threatening deterioration.  Critical care was time spent personally by me (independent of midlevel providers or residents) on the following activities: development of treatment plan with patient and/or surrogate as well as nursing, discussions with consultants, evaluation of patient's response to treatment, examination of patient, obtaining history from patient or surrogate, ordering and performing treatments and interventions, ordering and review of laboratory studies, ordering and review of radiographic studies, pulse oximetry and re-evaluation of patient's condition.    Glori Bickers MD 09/04/2019, 3:44 PM  Advanced Heart Failure Team Pager [phone] (M-F; 7a - 4p)  Please contact [facility] for night-coverage after hours (4p -7a ) and weekends on [email or web address]

## 2019-09-04 NOTE — Progress Notes (Signed)
NAME:  [name], MRN:  [id number], DOB:  1976/06/30, LOS: 56 ADMISSION DATE:  07/22/2019, CONSULTATION DATE:  12/24 REFERRING MD:  Sloan Leiter, CHIEF COMPLAINT:  Dyspnea   Brief History   44 y/o M admitted 12/14 with COVID pneumonia causing acute hypoxemic respiratory failure. Developed pneumomediastinum 12/23 with concern for possible bilateral small pneumothoraces on 12/24.  PCCM consulted for evaluation of pneumothoraces  Past Medical History  GERD HTN Asthma  Significant Hospital Events   12/14 admit with hypoxemic respiratory failure in setting of COVID-19 pneumonia 12/23 noted to have pneumomediastinum on chest x-ray 12/24 PCCM consulted for evaluation 12/26 worsening hypoxemia, intubated  12/27 VV fem/fem ECMO cannulation See bedside nursing event notes for full rundown of procedures after 12/27   Consults:  PCCM, CTS, CHF, Palliative  Procedures:  See bedside nursing event notes for full rundown of procedures.  Significant Diagnostic Tests:  CT chest 12/22 >> extensive pneumomediastinum, multifocal patchy bilateral groundglass opacities CXR 1/25 >> improving bilateral infiltrates. ECMO cannulae in place.  Micro Data:  BCx2 12/14 >> negative  Sputum 12/15 >> negative  1/9 bronch: serratia and MSSA 1/2 acid fast smear bronch: negative 1/2 fungal cx bronch: pending 1/12 BAL: serratia 1/18 BAL >> rare Serratia.  Antimicrobials:  Received remdesivir, steroids, actemra and convalescent plasma  5 days cefepime 12/24-12/29 Cefepime 1/4->1/14 vanc 1/4->1/14 Ceftriaxone 1/14 >>   Interim history/subjective:  Improving oxygenation. Continues to have substantial nasal bleeding. Decreased sedation this morning with increase secretions.  Objective   Blood pressure (!) 154/47, pulse 94, temperature 98.2 F (36.8 C), temperature source Oral, resp. rate 19, height _0  (1.727 m), weight (!) 145.9 kg, SpO2 (!) 81 %. CVP:  [4 mmHg-6 mmHg] 6 mmHg  Vent Mode: PRVC FiO2  (%):  [60 %-85 %] 60 % Set Rate:  [15 bmp] 15 bmp PEEP:  [15 cmH20] 15 cmH20 Plateau Pressure:  [26 cmH20-35 cmH20] 29 cmH20   Intake/Output Summary (Last 24 hours) at 09/04/2019 0855 Last data filed at 09/04/2019 0700 Gross per 24 hour  Intake 5049.16 ml  Output 6800 ml  Net -1750.84 ml   Filed Weights   09/01/19 0600 09/02/19 0100 09/03/19 0600  Weight: (!) 142.7 kg (!) 147 kg (!) 145.9 kg   Examination GEN: obese ill man on vent HEENT: trach in place, bleeding from the left nostril predominantly. Packing in place. CV: RRR, ext warm PULM: improving air movement with anterior vesicular breath sounds, bronchial to anterior axillary line.  GI: Soft, + BS EXT: Global anascarca has improved significantly - mild statis dermatitis of LLE. Appears more edematous compared to 3 days ago NEURO: sedated, NMB off. Opens eyes and blinks, pupils are reactive. Occasionally orients to voice PSYCH: RASS -3 SKIN: pale Femoral cannulas in place, sites looks okay Bilateral chest tubes in place, no tidaling or air leak, small amount. ongoing serosanguinous output Foley in place dark yellow urine Rectal tube in place with tan loose stool   CXR increased consolidation of basilar lung fields   Now better able to see the diaphragms. Continued dense consolidation of left base.  Flow (LPM): 4.81 ECMO Mode: VV ECMO Device: Cardiohelp  $ Ventilator Initial/Subsequent : Subsequent, Vent Mode: PRVC, Set Rate: 15 bmp, FiO2 (%): 60 %, I Time: 0.8 Sec(s), PEEP: 15 cmH20 Vt today 232m  Net IO Since Admission: 33,085.41 mL [09/04/19 0855]  Resolved Hospital Problem list   MSSA and serratia marcescens pneumonia   Assessment & Plan:    Acute hypoxemic respiratory failure  with ARDS secondary to COVID-19, s/p tracheostomy requiring mechanical ventilation and VV ECMO support.  COVID has been treated with all evidence based therapies, we are dealing with lingering severe diffuse alveolar damage. Marked  improvement in aeration of both lungs. Recirculation likely due to migration of return cannula. SVC cannula repositioned. Continue to wean sedation and allow patient participation. Resedate only if gas exchange is significantly impaired.  Posterior epistaxis.  Repacked multiple times  Cortrack removed and epistaxis appears to have resolved. Left nostril repacked with thrombi-pad. Petrolatum jelly to right to keep moist. Will try topical TXA  Daily Goals Checklist  Pain/Anxiety/Delirium protocol (if indicated): Increase oral regimen. Wean Versed off. Continue with Dilaudid and Precedex.  Restart low dose quetiapine. VAP protocol (if indicated): Bundle in place. Respiratory support goals: Continue PCV with ultra-lung protective ventilation, generous PEEP.  PEEP currently optimized. Continue to oversleep as may help with air hunger during sedation wean. Blood pressure target:  MAP>65, off pressors ECMO SVC cannula pulled back 2cm  DVT prophylaxis: on systemic anticoagulation on VV-ECMO - continue to current targets in spite of bleeding given circuit performance issues.  Nutrition Status: Nutrition Problem: Increased nutrient needs Etiology: acute illness(COVID PNA) Signs/Symptoms: estimated needs Interventions: Tube feeding, Prostat GI prophylaxis: Protonix per tube. Fluid status goals: continue  furosemide infusion. Keep 1.5-2.0L/day negative. Urinary catheter: Guide hemodynamic management Glucose control: euglycemic on SSI. Mobility/therapy needs: Bedrest.  Daily labs: as per ECMO protocol. Code Status: Full code  Family Communication:family updated by RN. Disposition: ICU   Labs   CBC: CBC Latest Ref Rng & Units 09/04/2019 09/04/2019 09/03/2019  WBC 4.0 - 10.5 K/uL - 12.3(H) -  Hemoglobin 13.0 - 17.0 g/dL 8.8(L) 8.4(L) 9.2(L)  Hematocrit 39.0 - 52.0 % 26.0(L) 29.0(L) 27.0(L)  Platelets 150 - 400 K/uL - 170 -    Basic Metabolic Panel: BMP Latest Ref Rng & Units 09/04/2019  09/04/2019 09/03/2019  Glucose 70 - 99 mg/dL - 151(H) -  BUN 6 - 20 mg/dL - 46(H) -  Creatinine 0.61 - 1.24 mg/dL - 0.75 -  Sodium 135 - 145 mmol/L 146(H) 144 146(H)  Potassium 3.5 - 5.1 mmol/L 3.6 3.5 3.5  Chloride 98 - 111 mmol/L - 102 -  CO2 22 - 32 mmol/L - 30 -  Calcium 8.9 - 10.3 mg/dL - 9.3 -    GFR: Estimated Creatinine Clearance: 163.9 mL/min (by C-G formula based on SCr of 0.75 mg/dL). Recent Labs  Lab 09/01/19 0414 09/01/19 1623 09/02/19 0443 09/02/19 0443 09/02/19 1656 09/03/19 0448 09/03/19 1618 09/04/19 0416  WBC 9.1   < > 10.2   < > 10.7* 10.7* 14.4* 12.3*  LATICACIDVEN 1.3  --  1.4  --   --  1.2  --  1.1   < > = values in this interval not displayed.    Liver Function Tests: Recent Labs  Lab 08/31/19 1150  AST 24  ALT 26  ALKPHOS 75  BILITOT 0.7  PROT 6.2*  ALBUMIN 2.2*   No results for input(s): LIPASE, AMYLASE in the last 168 hours. No results for input(s): AMMONIA in the last 168 hours.  ABG    Component Value Date/Time   PHART 7.462 (H) 09/04/2019 0420   PCO2ART 45.2 09/04/2019 0420   PO2ART 52.0 (L) 09/04/2019 0420   HCO3 32.3 (H) 09/04/2019 0420   TCO2 34 (H) 09/04/2019 0420   ACIDBASEDEF 2.0 08/28/2019 1519   O2SAT 88.0 09/04/2019 0420     Coagulation Profile: No results for input(s): INR, PROTIME  in the last 168 hours.  Cardiac Enzymes: No results for input(s): CKTOTAL, CKMB, CKMBINDEX, TROPONINI in the last 168 hours.  HbA1C: Hgb A1c MFr Bld  Date/Time Value Ref Range Status  07/24/2019 04:50 AM 6.7 (H) 4.8 - 5.6 % Final    Comment:    (NOTE) Pre diabetes:          5.7%-6.4% Diabetes:              >6.4% Glycemic control for   <7.0% adults with diabetes   09/30/2017 05:36 AM 5.7 (H) 4.8 - 5.6 % Final    Comment:    (NOTE)         Prediabetes: 5.7 - 6.4         Diabetes: >6.4         Glycemic control for adults with diabetes: <7.0     CBG: Recent Labs  Lab 09/03/19 1616 09/03/19 2013 09/03/19 2340 09/04/19 0418  09/04/19 0832  GLUCAP 169* 169* 174* 136* 174*    CRITICAL CARE Performed by: Kipp Brood   Total critical care time: 50 minutes  Critical care time was exclusive of separately billable procedures and treating other patients.  Critical care was necessary to treat or prevent imminent or life-threatening deterioration.  Critical care was time spent personally by me on the following activities: development of treatment plan with patient and/or surrogate as well as nursing, discussions with consultants, evaluation of patient's response to treatment, examination of patient, obtaining history from patient or surrogate, ordering and performing treatments and interventions, ordering and review of laboratory studies, ordering and review of radiographic studies, pulse oximetry, re-evaluation of patient's condition and participation in multidisciplinary rounds.  Kipp Brood, MD [facility] ICU Physician [place]  Pager: [phone] Mobile: [phone] After hours: [phone].     09/04/2019, 8:55 AM

## 2019-09-04 NOTE — Progress Notes (Signed)
ANTICOAGULATION CONSULT NOTE - Follow Up Consult  Pharmacy Consult for heparin Indication: ECMO  Patient Measurements: Height: 5' 8"  (172.7 cm) Weight: (!) 328 lb 7.8 oz (149 kg) IBW/kg (Calculated) : 68.4 Heparin Dosing Weight: 104.5kg  Vital Signs: Temp: 97.5 F (36.4 C) (01/21 1600) Temp Source: Axillary (01/21 1600) Pulse Rate: 87 (01/21 1700)  Labs: Recent Labs    09/03/19 0448 09/03/19 0724 09/03/19 1618 09/03/19 2015 09/04/19 0416 09/04/19 0416 09/04/19 0420 09/04/19 0420 09/04/19 0834 09/04/19 1617  HGB 8.7*   < > 9.1*   < > 8.4*   < > 8.8*   < > 20.7* 8.0*  HCT 29.2*   < > 30.3*   < > 29.0*   < > 26.0*  --  61.0* 27.0*  PLT 144*   < > 174  --  170  --   --   --   --  154  APTT 98*   < > 88*  --  98*  --   --   --   --  87*  HEPARINUNFRC 0.37   < > 0.44  --  0.36  --   --   --   --  0.41  CREATININE 0.79  --   --   --  0.75  --   --   --   --  0.66   < > = values in this interval not displayed.    Assessment: 44yo male on VV ECMO on heparin infusion.  -Heparin level therapeutic at 0.41, aPTT at 87s, on 2600 units/hr. Nosebleed appears stable, remains to be packed.   Discussed with CCM on evening rounds, heparin may have to be held for ~24h to allow nose to heal, will continue current care for now.   Goal of Therapy:  Heparin level 0.3-0.5 units/ml   Plan:  Continue IV heparin at 2600 units/hr. Monitor heparin level and CBC 5AM/5PM, daily LDH Monitor for any increase in bleeding from mouth  Erin Hearing PharmD., BCPS Clinical Pharmacist 09/04/2019 5:21 PM

## 2019-09-05 ENCOUNTER — Inpatient Hospital Stay (HOSPITAL_COMMUNITY): Payer: Managed Care, Other (non HMO)

## 2019-09-05 DIAGNOSIS — R0603 Acute respiratory distress: Secondary | ICD-10-CM

## 2019-09-05 LAB — BPAM RBC
Blood Product Expiration Date: 202102122359
Blood Product Expiration Date: 202102232359
Blood Product Expiration Date: 202102232359
Blood Product Expiration Date: 202102252359
ISSUE DATE / TIME: 202101280808
ISSUE DATE / TIME: 202101280808
ISSUE DATE / TIME: 202101280808
ISSUE DATE / TIME: 202101280808
Unit Type and Rh: 5100
Unit Type and Rh: 5100
Unit Type and Rh: 5100
Unit Type and Rh: 5100

## 2019-09-05 LAB — POCT I-STAT 7, (LYTES, BLD GAS, ICA,H+H)
Acid-Base Excess: 7 mmol/L — ABNORMAL HIGH (ref 0.0–2.0)
Acid-Base Excess: 7 mmol/L — ABNORMAL HIGH (ref 0.0–2.0)
Acid-Base Excess: 4 mmol/L — ABNORMAL HIGH (ref 0.0–2.0)
Acid-Base Excess: 6 mmol/L — ABNORMAL HIGH (ref 0.0–2.0)
Acid-Base Excess: 6 mmol/L — ABNORMAL HIGH (ref 0.0–2.0)
Bicarbonate: 31.8 mmol/L — ABNORMAL HIGH (ref 20.0–28.0)
Bicarbonate: 33.6 mmol/L — ABNORMAL HIGH (ref 20.0–28.0)
Bicarbonate: 30 mmol/L — ABNORMAL HIGH (ref 20.0–28.0)
Bicarbonate: 31.6 mmol/L — ABNORMAL HIGH (ref 20.0–28.0)
Bicarbonate: 31.5 mmol/L — ABNORMAL HIGH (ref 20.0–28.0)
Calcium, Ion: 1.25 mmol/L (ref 1.15–1.40)
Calcium, Ion: 1.26 mmol/L (ref 1.15–1.40)
Calcium, Ion: 1.28 mmol/L (ref 1.15–1.40)
Calcium, Ion: 1.28 mmol/L (ref 1.15–1.40)
Calcium, Ion: 1.26 mmol/L (ref 1.15–1.40)
HCT: 26 % — ABNORMAL LOW (ref 39.0–52.0)
HCT: 28 % — ABNORMAL LOW (ref 39.0–52.0)
HCT: 26 % — ABNORMAL LOW (ref 39.0–52.0)
HCT: 30 % — ABNORMAL LOW (ref 39.0–52.0)
HCT: 25 % — ABNORMAL LOW (ref 39.0–52.0)
Hemoglobin: 8.8 g/dL — ABNORMAL LOW (ref 13.0–17.0)
Hemoglobin: 9.5 g/dL — ABNORMAL LOW (ref 13.0–17.0)
Hemoglobin: 8.8 g/dL — ABNORMAL LOW (ref 13.0–17.0)
Hemoglobin: 10.2 g/dL — ABNORMAL LOW (ref 13.0–17.0)
Hemoglobin: 8.5 g/dL — ABNORMAL LOW (ref 13.0–17.0)
O2 Saturation: 93 %
O2 Saturation: 89 %
O2 Saturation: 91 %
O2 Saturation: 88 %
O2 Saturation: 85 %
Patient temperature: 98.7
Patient temperature: 36.9
Patient temperature: 97.7
Patient temperature: 97.9
Potassium: 3.8 mmol/L (ref 3.5–5.1)
Potassium: 4.4 mmol/L (ref 3.5–5.1)
Potassium: 4.4 mmol/L (ref 3.5–5.1)
Potassium: 3.9 mmol/L (ref 3.5–5.1)
Potassium: 3.6 mmol/L (ref 3.5–5.1)
Sodium: 150 mmol/L — ABNORMAL HIGH (ref 135–145)
Sodium: 151 mmol/L — ABNORMAL HIGH (ref 135–145)
Sodium: 149 mmol/L — ABNORMAL HIGH (ref 135–145)
Sodium: 149 mmol/L — ABNORMAL HIGH (ref 135–145)
Sodium: 149 mmol/L — ABNORMAL HIGH (ref 135–145)
TCO2: 33 mmol/L — ABNORMAL HIGH (ref 22–32)
TCO2: 35 mmol/L — ABNORMAL HIGH (ref 22–32)
TCO2: 32 mmol/L (ref 22–32)
TCO2: 33 mmol/L — ABNORMAL HIGH (ref 22–32)
TCO2: 33 mmol/L — ABNORMAL HIGH (ref 22–32)
pCO2 arterial: 46.5 mmHg (ref 32.0–48.0)
pCO2 arterial: 55.7 mmHg — ABNORMAL HIGH (ref 32.0–48.0)
pCO2 arterial: 51.8 mmHg — ABNORMAL HIGH (ref 32.0–48.0)
pCO2 arterial: 51.1 mmHg — ABNORMAL HIGH (ref 32.0–48.0)
pCO2 arterial: 49.7 mmHg — ABNORMAL HIGH (ref 32.0–48.0)
pH, Arterial: 7.443 (ref 7.350–7.450)
pH, Arterial: 7.388 (ref 7.350–7.450)
pH, Arterial: 7.37 (ref 7.350–7.450)
pH, Arterial: 7.397 (ref 7.350–7.450)
pH, Arterial: 7.409 (ref 7.350–7.450)
pO2, Arterial: 66 mmHg — ABNORMAL LOW (ref 83.0–108.0)
pO2, Arterial: 60 mmHg — ABNORMAL LOW (ref 83.0–108.0)
pO2, Arterial: 63 mmHg — ABNORMAL LOW (ref 83.0–108.0)
pO2, Arterial: 54 mmHg — ABNORMAL LOW (ref 83.0–108.0)
pO2, Arterial: 50 mmHg — ABNORMAL LOW (ref 83.0–108.0)

## 2019-09-05 LAB — CBC
HCT: 27.2 % — ABNORMAL LOW (ref 39.0–52.0)
HCT: 27.9 % — ABNORMAL LOW (ref 39.0–52.0)
HCT: 26.1 % — ABNORMAL LOW (ref 39.0–52.0)
Hemoglobin: 8.1 g/dL — ABNORMAL LOW (ref 13.0–17.0)
Hemoglobin: 8.1 g/dL — ABNORMAL LOW (ref 13.0–17.0)
Hemoglobin: 7.8 g/dL — ABNORMAL LOW (ref 13.0–17.0)
MCH: 28.4 pg (ref 26.0–34.0)
MCH: 27.9 pg (ref 26.0–34.0)
MCH: 28.6 pg (ref 26.0–34.0)
MCHC: 29.8 g/dL — ABNORMAL LOW (ref 30.0–36.0)
MCHC: 29 g/dL — ABNORMAL LOW (ref 30.0–36.0)
MCHC: 29.9 g/dL — ABNORMAL LOW (ref 30.0–36.0)
MCV: 95.4 fL (ref 80.0–100.0)
MCV: 96.2 fL (ref 80.0–100.0)
MCV: 95.6 fL (ref 80.0–100.0)
Platelets: 166 10*3/uL (ref 150–400)
Platelets: 183 10*3/uL (ref 150–400)
Platelets: 152 10*3/uL (ref 150–400)
RBC: 2.85 MIL/uL — ABNORMAL LOW (ref 4.22–5.81)
RBC: 2.9 MIL/uL — ABNORMAL LOW (ref 4.22–5.81)
RBC: 2.73 MIL/uL — ABNORMAL LOW (ref 4.22–5.81)
RDW: 18.5 % — ABNORMAL HIGH (ref 11.5–15.5)
RDW: 18.9 % — ABNORMAL HIGH (ref 11.5–15.5)
RDW: 19.1 % — ABNORMAL HIGH (ref 11.5–15.5)
WBC: 13 10*3/uL — ABNORMAL HIGH (ref 4.0–10.5)
WBC: 13.6 10*3/uL — ABNORMAL HIGH (ref 4.0–10.5)
WBC: 12.5 10*3/uL — ABNORMAL HIGH (ref 4.0–10.5)
nRBC: 1.8 % — ABNORMAL HIGH (ref 0.0–0.2)
nRBC: 3 % — ABNORMAL HIGH (ref 0.0–0.2)
nRBC: 2.9 % — ABNORMAL HIGH (ref 0.0–0.2)

## 2019-09-05 LAB — TYPE AND SCREEN
ABO/RH(D): O POS
Antibody Screen: NEGATIVE
Unit division: 0
Unit division: 0
Unit division: 0
Unit division: 0

## 2019-09-05 LAB — APTT
aPTT: 93 seconds — ABNORMAL HIGH (ref 24–36)
aPTT: 101 seconds — ABNORMAL HIGH (ref 24–36)

## 2019-09-05 LAB — BASIC METABOLIC PANEL
Anion gap: 12 (ref 5–15)
BUN: 57 mg/dL — ABNORMAL HIGH (ref 6–20)
CO2: 30 mmol/L (ref 22–32)
Calcium: 9 mg/dL (ref 8.9–10.3)
Chloride: 106 mmol/L (ref 98–111)
Creatinine, Ser: 0.68 mg/dL (ref 0.61–1.24)
GFR calc Af Amer: >60 mL/min (ref >60)
GFR calc non Af Amer: >60 mL/min (ref >60)
Glucose, Bld: 168 mg/dL — ABNORMAL HIGH (ref 70–99)
Potassium: 3.8 mmol/L (ref 3.5–5.1)
Sodium: 148 mmol/L — ABNORMAL HIGH (ref 135–145)

## 2019-09-05 LAB — GLUCOSE, CAPILLARY
Glucose-Capillary: 145 mg/dL — ABNORMAL HIGH (ref 70–99)
Glucose-Capillary: 153 mg/dL — ABNORMAL HIGH (ref 70–99)
Glucose-Capillary: 163 mg/dL — ABNORMAL HIGH (ref 70–99)
Glucose-Capillary: 193 mg/dL — ABNORMAL HIGH (ref 70–99)
Glucose-Capillary: 231 mg/dL — ABNORMAL HIGH (ref 70–99)
Glucose-Capillary: 240 mg/dL — ABNORMAL HIGH (ref 70–99)

## 2019-09-05 LAB — COOXEMETRY PANEL
Carboxyhemoglobin: 3.7 % — ABNORMAL HIGH (ref 0.5–1.5)
Methemoglobin: 0.9 % (ref 0.0–1.5)
O2 Saturation: 75.3 %
Total hemoglobin: 16 g/dL (ref 12.0–16.0)

## 2019-09-05 LAB — PREPARE RBC (CROSSMATCH)

## 2019-09-05 LAB — HEPARIN LEVEL (UNFRACTIONATED)
Heparin Unfractionated: 0.43 IU/mL (ref 0.30–0.70)
Heparin Unfractionated: 0.48 IU/mL (ref 0.30–0.70)

## 2019-09-05 LAB — LACTATE DEHYDROGENASE: LDH: 481 U/L — ABNORMAL HIGH (ref 98–192)

## 2019-09-05 LAB — LACTIC ACID, PLASMA: Lactic Acid, Venous: 1.1 mmol/L (ref 0.5–1.9)

## 2019-09-05 MED ORDER — AMIODARONE HCL IN DEXTROSE 360-4.14 MG/200ML-% IV SOLN
60.0000 mg/h | INTRAVENOUS | Status: AC
  Administered 2019-09-05 (×2): 60 mg/h via INTRAVENOUS
  Filled 2019-09-05: qty 400

## 2019-09-05 MED ORDER — METOPROLOL TARTRATE 5 MG/5ML IV SOLN
INTRAVENOUS | Status: AC
  Administered 2019-09-05: 5 mg
  Filled 2019-09-05: qty 5

## 2019-09-05 MED ORDER — POTASSIUM CHLORIDE 20 MEQ/15ML (10%) PO SOLN
40.0000 meq | Freq: Once | ORAL | Status: AC
  Administered 2019-09-05: 40 meq
  Filled 2019-09-05: qty 30

## 2019-09-05 MED ORDER — METOLAZONE 2.5 MG PO TABS
2.5000 mg | ORAL_TABLET | Freq: Once | ORAL | Status: AC
  Administered 2019-09-05: 2.5 mg via ORAL
  Filled 2019-09-05: qty 1

## 2019-09-05 MED ORDER — PHENYLEPHRINE 40 MCG/ML (10ML) SYRINGE FOR IV PUSH (FOR BLOOD PRESSURE SUPPORT)
PREFILLED_SYRINGE | INTRAVENOUS | Status: AC
  Filled 2019-09-05: qty 10

## 2019-09-05 MED ORDER — ACETAZOLAMIDE 250 MG PO TABS
250.0000 mg | ORAL_TABLET | Freq: Two times a day (BID) | ORAL | Status: AC
  Administered 2019-09-05 – 2019-09-06 (×4): 250 mg
  Filled 2019-09-05 (×4): qty 1

## 2019-09-05 MED ORDER — AMIODARONE LOAD VIA INFUSION
150.0000 mg | Freq: Once | INTRAVENOUS | Status: AC
  Administered 2019-09-05: 150 mg via INTRAVENOUS
  Filled 2019-09-05: qty 83.34

## 2019-09-05 MED ORDER — ALBUMIN HUMAN 5 % IV SOLN
INTRAVENOUS | Status: AC
  Filled 2019-09-05: qty 250

## 2019-09-05 MED ORDER — SODIUM CHLORIDE 0.9% IV SOLUTION: Freq: Once | INTRAVENOUS | Status: DC

## 2019-09-05 MED ORDER — VASOPRESSIN 20 UNIT/ML IV SOLN
0.0300 [IU]/min | INTRAVENOUS | Status: DC
  Administered 2019-09-07: 0.03 [IU]/min via INTRAVENOUS
  Filled 2019-09-05 (×2): qty 2

## 2019-09-05 MED ORDER — EPINEPHRINE 1 MG/10ML IJ SOSY
PREFILLED_SYRINGE | INTRAMUSCULAR | Status: AC
  Filled 2019-09-05: qty 20

## 2019-09-05 MED ORDER — METOLAZONE 2.5 MG PO TABS: 2.5000 mg | ORAL_TABLET | Freq: Once | ORAL | Status: AC

## 2019-09-05 MED ORDER — AMIODARONE HCL IN DEXTROSE 360-4.14 MG/200ML-% IV SOLN
30.0000 mg/h | INTRAVENOUS | Status: DC
  Administered 2019-09-05 – 2019-09-08 (×4): 30 mg/h via INTRAVENOUS
  Filled 2019-09-05 (×5): qty 200

## 2019-09-05 MED ORDER — SODIUM BICARBONATE 8.4 % IV SOLN
INTRAVENOUS | Status: AC
  Filled 2019-09-05: qty 50

## 2019-09-05 MED ORDER — CALCIUM CHLORIDE 10 % IV SOLN
INTRAVENOUS | Status: AC
  Filled 2019-09-05: qty 10

## 2019-09-05 MED ORDER — CEFAZOLIN SODIUM-DEXTROSE 2-4 GM/100ML-% IV SOLN
2.0000 g | Freq: Three times a day (TID) | INTRAVENOUS | Status: DC
  Administered 2019-09-05 – 2019-09-10 (×16): 2 g via INTRAVENOUS
  Filled 2019-09-05 (×19): qty 100

## 2019-09-05 MED ORDER — AMIODARONE LOAD VIA INFUSION
150.0000 mg | Freq: Once | INTRAVENOUS | Status: DC
  Filled 2019-09-05: qty 83.34

## 2019-09-05 MED ORDER — INSULIN DETEMIR 100 UNIT/ML ~~LOC~~ SOLN
10.0000 [IU] | Freq: Two times a day (BID) | SUBCUTANEOUS | Status: DC
  Administered 2019-09-05 – 2019-09-07 (×5): 10 [IU] via SUBCUTANEOUS
  Filled 2019-09-05 (×7): qty 0.1

## 2019-09-05 NOTE — Progress Notes (Addendum)
Pt evaluated by rounding team. Pt pupils unequal, R eye dilated to 43m and L eye 479m Pt pupils noted to react to light. CT head, chest, and abd ordered to evaluated pt status. Emergency transport kit at beside. 2 full 02 tanks and ECMO circuit change out kit checked and ready by circuit. 4units of emergency blood along beside also. Pt transported to CT and back with no events noted. See chart for ECMO flows and vitals pre and post transport.

## 2019-09-05 NOTE — Progress Notes (Signed)
Discussed increased CBGs with Agarwala MD. Awaiting orders. Also ordered to keep SBP above 110. Levophed currently off, weaning vasopressin.

## 2019-09-05 NOTE — Progress Notes (Signed)
  Developed AF with RVR today with hypotension. Levophed and VP started.   IV amio started and eventually converted to SR.  CT H/C/A/P reviewed - no acute CVA - severe sinusitis. No abscess - severe bilateral lung infiltrates R>L   Evidence of significant recirculation off RIJ drainage cannula. SVO2 ~80. However overall sats stable in the 90s on 60% FiO2. Flows good.   D/w Dr. Orvan Seen. Plan removal of SVC drainage cannula in am.   Additional CCT 40 mins.   Glori Bickers, MD  7:45 PM

## 2019-09-05 NOTE — TOC Progression Note (Signed)
Transition of Care [facility]) - Progression Note    Patient Details  Name: NARCISO STOUTENBURG MRN: [id number] Date of Birth: Dec 28, 1975  Transition of Care [facility]) CM/SW Contact  Graves-Bigelow, Ocie Cornfield, RN Phone Number: 09/05/2019, 3:59 PM  Clinical Narrative:  Patient continues on IV Amio and IV Ancef. Case Manager continues to follow for transition of care needs as the patient progresses.                  Expected Discharge Plan: [place] Barriers to Discharge: Continued Medical Work up(Continues on IV Rocephin, Nimbex and Precedex.)  Expected Discharge Plan and Services Expected Discharge Plan: [place]   Discharge Planning Services: CM Consult   Living arrangements for the past 2 months: Single Family Home                    Social Determinants of Health (SDOH) Interventions    Readmission Risk Interventions No flowsheet data found.

## 2019-09-05 NOTE — CV Procedure (Signed)
ECMO NOTE:  Indication: Respiratory failure due to COVID PNA  Initial cannulation date: 08/03/2019  ECMO type: VV ECMO (Cardio-Help)  Return cannula:  1) 21 FR RFV in RA  Inflow Cannulas:   1) 19 FR RIJ 2) 25 FR  LFV  ECMO events:  Initial cannulation 12/26 Circuit changed and trach exchanged on 1/8.   Underwent addition of a RIJ drainage cannula on 1/20 Circuit changed on 1/22 for rising LDH Had evidence of recirculation and RIJ cannula pulled back on 1/26  Daily data:  Remains on VV ecmo with good flows.   Flow 4.74 RPM 3740 dP 35 Pven  -67 Sweep 14  SVO2 62.5%  Labs:  Hgb 8.8 LDH 450 -> 481 Heparin level: 0.43   Glori Bickers, MD  11:42 AM

## 2019-09-05 NOTE — Progress Notes (Signed)
ANTICOAGULATION CONSULT NOTE - Follow Up Consult  Pharmacy Consult for Heparin Indication: ECMO  Patient Measurements: Height: 5' 8"  (172.7 cm) Weight: (!) 328 lb 7.8 oz (149 kg) IBW/kg (Calculated) : 68.4 Heparin Dosing Weight: 104.5kg  Vital Signs: Temp: 97.5 F (36.4 C) (01/21 1600) Temp Source: Axillary (01/21 1600) Pulse Rate: 87 (01/21 1700)  Labs: Recent Labs    09/04/19 0416 09/04/19 0420 09/04/19 1617 09/04/19 1902 09/05/19 0401 09/05/19 0402 09/05/19 0420 09/05/19 1612 09/05/19 1612 09/05/19 1615 09/05/19 1900 09/05/19 1932  HGB 8.4*   < > 8.0*   < >  --  8.1*   < > 8.1*   < > 10.2*  --  8.5*  HCT 29.0*   < > 27.0*   < >  --  27.2*   < > 27.9*  --  30.0*  --  25.0*  PLT 170   < > 154  --   --  166  --  183  --   --   --   --   APTT 98*   < > 87*  --   --  93*  --  101*  --   --   --   --   HEPARINUNFRC 0.36   < > 0.41  --  0.43  --   --   --   --   --  0.48  --   CREATININE 0.75  --  0.66  --   --  0.68  --   --   --   --   --   --    < > = values in this interval not displayed.    Assessment: 44yo male on VV ECMO on heparin infusion. He is noted with recent epistaxis with nasal packing  -Heparin level therapeutic at 0.48    Goal of Therapy:  Heparin level 0.3-0.5 units/ml   Plan:  Continue IV heparin at 2600 units/hr. Monitor heparin level and CBC 5AM/5PM, daily LDH Monitor for any increase in bleeding from mouth/nose  Hildred Laser, PharmD Clinical Pharmacist **Pharmacist phone directory can now be found on [email or web address] ([email or web address]).  Listed under [place].

## 2019-09-05 NOTE — Progress Notes (Signed)
Patient ID: [name], male   DOB: Oct 26, 1975, 44 y.o.   MRN: [id number]    Advanced Heart Failure Rounding Note   Subjective:    Circuit changed and trach exchanged on 1/8.   Underwent addition of a RIJ drainage cannula on 1/20 Circuit changed on 1/22 for rising LDH Had evidence of recirculation and RIJ cannula pulled back on 1/26  Remains on VV ecmo with good flows.   Continues to have epistaxis. Sats down some overnight  ABG 7.39/56/60/89%  Co-ox 75%   Remains on lasix gtt at 8/hr. Weight down 11 pounds but still up about 40 pounds  CXR: Diffuse bilateral lung opacities - no change. Personally reviewed   On vent now at 100% PEEP 15. PlatP 30, Bi-vent setting.TVs ~150cc Personally reviewed  hgb 9.5 LDH 480 -> 445 -> 432-> 450 -> 481   ECMO parameters - see separate ecmo note   Objective:   Weight Range:  Vital Signs:   Temp:  [98.2 F (36.8 C)-98.7 F (37.1 C)] 98.7 F (37.1 C) (01/28 0800) Pulse Rate:  [79-121] 111 (01/28 1000) Resp:  [0-27] 23 (01/28 1115) BP: (183)/(69) 183/69 (01/27 1501) SpO2:  [79 %-100 %] 93 % (01/28 1000) Arterial Line BP: (91-189)/(39-85) 91/39 (01/28 1115) FiO2 (%):  [80 %-100 %] 80 % (01/28 0930) Weight:  [140.9 kg] 140.9 kg (01/28 0440) Last BM Date: 09/05/19  Weight change: Filed Weights   09/02/19 0100 09/03/19 0600 09/05/19 0440  Weight: (!) 147 kg (!) 145.9 kg (!) 140.9 kg    Intake/Output:   Intake/Output Summary (Last 24 hours) at 09/05/2019 1127 Last data filed at 09/05/2019 1100 Gross per 24 hour  Intake 5567.33 ml  Output 4640 ml  Net 927.33 ml     Physical Exam: General: Sedated on vent. Bed inclined + anasarca HEENT: normal + nasal packing +epistaxis + R pupil larger and reactive Neck: supple.RIJ cannula Carotids 2+ bilat; no bruits. No lymphadenopathy or thryomegaly appreciated. Cor: PMI nondisplaced. Tachy regular  No rubs, gallops or murmurs. Lungs: minimal air movement + crackles Abdomen: obese,  soft, nontender, nondistended. No hepatosplenomegaly. No bruits or masses. Good bowel sounds. Extremities: no cyanosis, clubbing, rash, 3+ edema + bilateral femoral venous cannulas Neuro: intubated sedated R pupil larger but reactive   Telemetry: Sinus tach 100-120s  Personally reviewed  Labs: Basic Metabolic Panel: Recent Labs  Lab 08/31/19 0404 08/31/19 0421 08/31/19 1150 08/31/19 1630 09/01/19 0414 09/01/19 0425 09/02/19 0443 09/02/19 5284 09/03/19 0448 09/03/19 0724 09/04/19 0416 09/04/19 0420 09/04/19 1617 09/04/19 1902 09/05/19 0402 09/05/19 0420 09/05/19 0725  NA 143   < > 145   < > 145   < > 145   < > 145   < > 144   < > 146* 147* 148* 150* 151*  K 3.6   < > 3.6   < > 4.0   < > 4.7   < > 4.0   < > 3.5   < > 3.3* 3.7 3.8 3.8 4.4  CL 102   < > 104   < > 106   < > 106  --  105  --  102  --  105  --  106  --   --   CO2 29   < > 29   < > 28   < > 28  --  28  --  30  --  30  --  30  --   --   GLUCOSE 192*   < >  191*   < > 177*   < > 160*  --  160*  --  151*  --  186*  --  168*  --   --   BUN 33*   < > 31*   < > 35*   < > 37*  --  44*  --  46*  --  52*  --  57*  --   --   CREATININE 0.74   < > 0.65   < > 0.55*   < > 0.64  --  0.79  --  0.75  --  0.66  --  0.68  --   --   CALCIUM 8.6*   < > 8.6*   < > 9.0   < > 9.3   < > 9.4   < > 9.3  --  8.8*  --  9.0  --   --   MG 1.5*  --  2.3  --  2.3  --   --   --   --   --   --   --   --   --   --   --   --   PHOS  --   --   --   --  5.4*  --   --   --   --   --   --   --   --   --   --   --   --    < > = values in this interval not displayed.    Liver Function Tests: Recent Labs  Lab 08/31/19 1150  AST 24  ALT 26  ALKPHOS 75  BILITOT 0.7  PROT 6.2*  ALBUMIN 2.2*   No results for input(s): LIPASE, AMYLASE in the last 168 hours. No results for input(s): AMMONIA in the last 168 hours.  CBC: Recent Labs  Lab 08/30/19 0427 08/30/19 0500 09/03/19 0448 09/03/19 0724 09/03/19 1618 09/03/19 2015 09/04/19 0416  09/04/19 0420 09/04/19 1617 09/04/19 1902 09/05/19 0402 09/05/19 0420 09/05/19 0725  WBC 11.4*   < > 10.7*  --  14.4*  --  12.3*  --  10.5  --  13.0*  --   --   NEUTROABS 5.5  --   --   --   --   --   --   --   --   --   --   --   --   HGB 9.9*   < > 8.7*   < > 9.1*   < > 8.4*   < > 8.0* 9.5* 8.1* 8.8* 9.5*  HCT 31.8*   < > 29.2*   < > 30.3*   < > 29.0*   < > 27.0* 28.0* 27.2* 26.0* 28.0*  MCV 92.4   < > 96.1  --  95.0  --  96.0  --  94.7  --  95.4  --   --   PLT 109*   < > 144*  --  174  --  170  --  154  --  166  --   --    < > = values in this interval not displayed.    Cardiac Enzymes: No results for input(s): CKTOTAL, CKMB, CKMBINDEX, TROPONINI in the last 168 hours.  BNP: BNP (last 3 results) Recent Labs    07/22/19 1039  BNP 15.3    ProBNP (last 3 results) No results for input(s): PROBNP in the last 8760 hours.    Other results:  Imaging: CT ABDOMEN PELVIS WO CONTRAST  Result Date: 09/05/2019 CLINICAL DATA:  Abdominal pain and distention. COVID-19 pneumonia. EXAM: CT CHEST, ABDOMEN AND PELVIS WITHOUT CONTRAST TECHNIQUE: Multidetector CT imaging of the chest, abdomen and pelvis was performed following the standard protocol without IV contrast. COMPARISON:  CT scan of the abdomen dated 04/13/2019 FINDINGS: CT CHEST FINDINGS Cardiovascular: Tracheostomy tube, feeding tube and 3 central venous catheters in place in the chest. Bilateral chest tubes. Heart size is normal. No pericardial effusion. Mediastinum/Nodes: No significant axillary, hilar or mediastinal adenopathy. Thyroid gland, trachea, and esophagus are normal. Lungs/Pleura: There is dense consolidative pneumonia throughout both lungs. There is very little remaining aerated lung. No effusions. No pneumothorax. Musculoskeletal: No chest wall mass or suspicious bone lesions identified. CT ABDOMEN PELVIS FINDINGS Hepatobiliary: No focal liver abnormality is seen. No gallstones, gallbladder wall thickening, or biliary  dilatation. Pancreas: Unremarkable. No pancreatic ductal dilatation or surrounding inflammatory changes. Spleen: Normal in size without focal abnormality. Adrenals/Urinary Tract: Adrenal glands and kidneys are normal. No hydronephrosis. Foley catheter in the bladder. Small amount of air in the bladder. Stomach/Bowel: Stomach is within normal limits. Appendix appears normal. No evidence of bowel wall thickening, distention, or inflammatory changes. Drain in the rectum. Feeding tube in the fourth portion of the duodenum. Vascular/Lymphatic: Bilateral femoral vein catheters are in place with the tips in the upper inferior vena cava. No adenopathy. Reproductive: Prostate is unremarkable. Other: No ascites or free air. Musculoskeletal: Slight subcutaneous edema in the proximal left thigh and in the subcutaneous fat at the left lower quadrant of the abdomen of unknown etiology. No significant bone abnormality. IMPRESSION: 1. Dense consolidative pneumonia throughout both lungs. 2. No acute abnormality of the abdomen or pelvis. 3. Slight subcutaneous edema in the proximal left thigh and in the subcutaneous fat at the left lower quadrant of the abdomen of unknown etiology. Electronically Signed   By: Lorriane Shire M.D.   On: 09/05/2019 10:15   CT HEAD WO CONTRAST  Result Date: 09/05/2019 CLINICAL DATA:  Cerebral hemorrhage suspected. History of COVID-19 pneumonia with cardiac and respiratory failure on ECMO. Nose bleeds. EXAM: CT HEAD WITHOUT CONTRAST TECHNIQUE: Contiguous axial images were obtained from the base of the skull through the vertex without intravenous contrast. COMPARISON:  None. FINDINGS: Brain: There is no evidence of acute infarct, intracranial hemorrhage, mass, midline shift, or extra-axial fluid collection. The ventricles and sulci are normal. Vascular: No hyperdense vessel. Skull: No fracture or suspicious osseous lesion. Sinuses/Orbits: Complete opacification of the paranasal sinuses, mastoid air  cells, and tympanic cavities bilaterally. Unremarkable orbits. Other: Partially visualized feeding tube. IMPRESSION: 1. Unremarkable CT appearance of the brain. 2. Pansinusitis.  Large bilateral mastoid and middle ear effusions. Electronically Signed   By: Logan Bores M.D.   On: 09/05/2019 10:03   CT CHEST WO CONTRAST  Result Date: 09/05/2019 CLINICAL DATA:  Abdominal pain and distention. COVID-19 pneumonia. EXAM: CT CHEST, ABDOMEN AND PELVIS WITHOUT CONTRAST TECHNIQUE: Multidetector CT imaging of the chest, abdomen and pelvis was performed following the standard protocol without IV contrast. COMPARISON:  CT scan of the abdomen dated 04/13/2019 FINDINGS: CT CHEST FINDINGS Cardiovascular: Tracheostomy tube, feeding tube and 3 central venous catheters in place in the chest. Bilateral chest tubes. Heart size is normal. No pericardial effusion. Mediastinum/Nodes: No significant axillary, hilar or mediastinal adenopathy. Thyroid gland, trachea, and esophagus are normal. Lungs/Pleura: There is dense consolidative pneumonia throughout both lungs. There is very little remaining aerated lung. No effusions. No pneumothorax. Musculoskeletal: No chest  wall mass or suspicious bone lesions identified. CT ABDOMEN PELVIS FINDINGS Hepatobiliary: No focal liver abnormality is seen. No gallstones, gallbladder wall thickening, or biliary dilatation. Pancreas: Unremarkable. No pancreatic ductal dilatation or surrounding inflammatory changes. Spleen: Normal in size without focal abnormality. Adrenals/Urinary Tract: Adrenal glands and kidneys are normal. No hydronephrosis. Foley catheter in the bladder. Small amount of air in the bladder. Stomach/Bowel: Stomach is within normal limits. Appendix appears normal. No evidence of bowel wall thickening, distention, or inflammatory changes. Drain in the rectum. Feeding tube in the fourth portion of the duodenum. Vascular/Lymphatic: Bilateral femoral vein catheters are in place with the tips  in the upper inferior vena cava. No adenopathy. Reproductive: Prostate is unremarkable. Other: No ascites or free air. Musculoskeletal: Slight subcutaneous edema in the proximal left thigh and in the subcutaneous fat at the left lower quadrant of the abdomen of unknown etiology. No significant bone abnormality. IMPRESSION: 1. Dense consolidative pneumonia throughout both lungs. 2. No acute abnormality of the abdomen or pelvis. 3. Slight subcutaneous edema in the proximal left thigh and in the subcutaneous fat at the left lower quadrant of the abdomen of unknown etiology. Electronically Signed   By: Lorriane Shire M.D.   On: 09/05/2019 10:15   DG CHEST PORT 1 VIEW  Result Date: 09/05/2019 CLINICAL DATA:  ECMO EXAM: PORTABLE CHEST 1 VIEW COMPARISON:  Radiograph 09/04/2019 FINDINGS: Bilateral chest tubes are in place. Large bore right IJ approach ECMO cannula and IVC cannula remain in similar position to comparison study. A transesophageal feeding tube tip terminates below the level of the GE junction. Widespread opacities throughout both lungs are similar to comparison accounting for differences in technique. The cardiomediastinal contours are obscured by overlying opacity. No visible pneumothorax. Suspect at least some pleural fluid on the left. No acute osseous or soft tissue abnormality. IMPRESSION: 1. No significant change in widespread opacities throughout both lungs. Suspect some left effusion. 2. Stable lines and tubes. 3. No visible pneumothorax. Electronically Signed   By: Lovena Le M.D.   On: 09/05/2019 05:49   DG CHEST PORT 1 VIEW  Result Date: 09/04/2019 CLINICAL DATA:  Check support apparatus position EXAM: PORTABLE CHEST 1 VIEW FINDINGS: Support devices including bilateral chest tubes, endotracheal tube, feeding tube, right PICC line and left central line remain in place, unchanged. Right jugular catheter unchanged. Severe diffuse bilateral airspace disease is unchanged. No effusions or  pneumothorax. Cardiomegaly. IMPRESSION: Stable support devices. No pneumothorax. Severe diffuse bilateral airspace disease and cardiomegaly, unchanged. Electronically Signed   By: Rolm Baptise M.D.   On: 09/04/2019 19:19   DG CHEST PORT 1 VIEW  Result Date: 09/04/2019 CLINICAL DATA:  Encounter for intubation EXAM: PORTABLE CHEST 1 VIEW COMPARISON:  Portable exam 0556 hours compared to 09/03/2019 FINDINGS: Tip of endotracheal tube projects 3.8 cm above carina. RIGHT jugular Cordis catheter tip projects over SVC. Feeding tube extends into stomach. Cardiac line tip projects over RIGHT atrium from infra diaphragmatic approach. BILATERAL thoracostomy tubes. RIGHT arm PICC line tip projects over SVC. Diffuse BILATERAL airspace infiltrates which could represent multifocal pneumonia or ARDS. No pleural effusion or pneumothorax. IMPRESSION: Lines and tubes as above. Persistent BILATERAL pulmonary infiltrates question multifocal pneumonia versus ARDS. Electronically Signed   By: Lavonia Dana M.D.   On: 09/04/2019 09:18     Medications:     Scheduled Medications: . acetaZOLAMIDE  250 mg Per Tube BID  . amiodarone  150 mg Intravenous Once  . vitamin C  500 mg Per Tube Daily  .  chlorhexidine gluconate (MEDLINE KIT)  15 mL Mouth Rinse BID  . Chlorhexidine Gluconate Cloth  6 each Topical Daily  . cloNIDine  0.3 mg Per Tube BID  . diazepam  20 mg Per Tube Q6H  . feeding supplement (PRO-STAT SUGAR FREE 64)  60 mL Per Tube BID  . gabapentin  450 mg Per Tube Q8H  . HYDROmorphone  12 mg Per Tube Q4H  . insulin aspart  3-9 Units Subcutaneous Q4H  . ipratropium-albuterol  3 mL Nebulization Q4H  . mouth rinse  15 mL Mouth Rinse 10 times per day  . montelukast  10 mg Per Tube QHS  . oxymetazoline  1 spray Each Nare BID  . pantoprazole sodium  40 mg Per Tube QHS  . propranolol  20 mg Per Tube TID  . QUEtiapine  50 mg Per Tube BID  . sodium chloride flush  10-40 mL Intracatheter Q12H  . sodium chloride flush   10-40 mL Intracatheter Q12H  . Thrombi-Pad  1 each Topical Once  . Thrombi-Pad  1 each Topical Once  . zinc sulfate  220 mg Per Tube Daily    Infusions: . sodium chloride    . sodium chloride 10 mL/hr at 09/04/19 0300  . sodium chloride 10 mL/hr at 09/05/19 1100  . albumin human 12.5 g (09/03/19 0320)  . amiodarone 60 mg/hr (09/05/19 1100)   Followed by  . amiodarone    .  ceFAZolin (ANCEF) IV Stopped (09/05/19 1044)  . dexmedetomidine (PRECEDEX) IV infusion 1.6 mcg/kg/hr (09/05/19 1100)  . dextrose    . feeding supplement (PIVOT 1.5 CAL) 1,000 mL (09/05/19 0240)  . furosemide (LASIX) infusion 11 mg/hr (09/05/19 1100)  . heparin 2,600 Units/hr (09/05/19 1100)  . HYDROmorphone 24 mg/hr (09/05/19 1100)  . midazolam (VERSED) infusion 10 mg/hr (09/05/19 1100)  . norepinephrine 3 mcg/min (09/05/19 1100)  . propofol (DIPRIVAN) infusion Stopped (08/31/19 0910)  . vasopressin (PITRESSIN) infusion - *FOR SHOCK*      PRN Medications: Place/Maintain arterial line **AND** sodium chloride, sodium chloride, sodium chloride, acetaminophen (TYLENOL) oral liquid 160 mg/5 mL, acetaminophen, albumin human, dextrose, hydrALAZINE, HYDROmorphone, magnesium hydroxide, midazolam, ondansetron (ZOFRAN) IV, senna-docusate, sodium chloride, sodium chloride flush, sodium chloride flush   Assessment/Plan:    1. Acute hypoxic/hypercarbic respiratory failure due to COVID PNA: Remained markedly hypoxic despite full support. Intubated 12/26. S/p bilateral CTs 12/26 for pneumothoraces.  TEE on 12/26 LVEF 70% RV ok. VV ECMO begun 12/26. Tracheostomy 12/29.  COVID ARDS at this point. ECMO circuit changed 1/8 & 1/23 - ECMO parameters as per ECMO note - RIJ venous drainage cannula added 1/20. Circuit changed 1/23 - Flows look good. - TVs improving now > 300 ml - Massively volume overload. Increase lasix gtt to 11 - Epistaxis remain ongoing issue as well as increased secretions. Will repack - LDH 480 -> 412 -> 445  -> 432 -> 450 -> 481  Watch closely - Continue heparin. Discussed dosing with PharmD personally. - wean sedation as tolerated.  - Continue chest PT  2. Bilateral PTX: Due to barotrauma.  - Driving pressure minimized at < 15 Stable. No change  3. COVID PNA: management of COVID infection per CCM. CXR with bilateral multifocal PNA progressing to likely ARDS.  - s/p 10 days of remdesivir - 12/16, 12/2 s/p tociluzimab - 12/17 s/p convalescent plasma - Decadron at 57m/daily completed 1/11 - Completed cefepime/vanc - On ceftriaxone for nasal packing. D/w ID pharmacy -> will switch to Ancef ro better gram positive coverage  4. ID: Empiric coverage with vancomycin/cefepime initially for possible secondary bacterial PNA, course completed.   - treated with cefepime and vanc for + cx with staph epidimidis, MSSA and serratia (sensitivities ok). - On ceftriaxone for nasal packing. D/w ID pharmacy -> will switch to Ancef ro better gram positive coverage  5. Thrombocytopenia: Slow decrease in platelets, likely low level hemolysis + critical illness.  - resolved  6. Anemia: Bleeding at trach site resolved but still with epistaxis - hgb 8.8  today - Transfuse hgb < 8  7. FEN - continue TFs via oral Cor-trak  8.Dilated R pupil - will get head CT  9. Hypernatremia - NA up to 150. Will need FE flushes  D/w with ECMO team (ECMO coordinator/specialists, CCM, TCTS and pharmD) at bedside on multidisciplinary rounds. Continue to await ARDS recovery. Details as above.   Length of Stay: 49  CRITICAL CARE Performed by: Glori Bickers  Total critical care time: 40 minutes  Critical care time was exclusive of separately billable procedures and treating other patients.  Critical care was necessary to treat or prevent imminent or life-threatening deterioration.  Critical care was time spent personally by me (independent of midlevel providers or residents) on the following activities: development  of treatment plan with patient and/or surrogate as well as nursing, discussions with consultants, evaluation of patient's response to treatment, examination of patient, obtaining history from patient or surrogate, ordering and performing treatments and interventions, ordering and review of laboratory studies, ordering and review of radiographic studies, pulse oximetry and re-evaluation of patient's condition.    Glori Bickers MD 09/05/2019, 11:27 AM  Advanced Heart Failure Team Pager [phone] (M-F; 7a - 4p)  Please contact [facility] for night-coverage after hours (4p -7a ) and weekends on [email or web address]

## 2019-09-05 NOTE — Progress Notes (Signed)
Patient returned to room from CT around 0930 and went into a fib with RVR 120s-130s. 5 mg lopressor given with minimal response. Bensimhon MD and Orvan Seen MD notified. 150 mg amiodarone bolus given and drip started. BP began to drop and levophed was quickly titrated up. Bensimhon MD notified of continued low BPs. Orders for vasopressin at 0.03 and to bolus with another 150 mg of amiodarone once BP more stable. Once BP stable, patient flipped back into NSR. Second amiodarone bolus held.

## 2019-09-05 NOTE — Progress Notes (Signed)
5 Days Post-Op Procedure(s) (LRB): NASAL ENDOSCOPY WITH EPISTAXIS CONTROL WITH CAUTERIZATION (N/A) Subjective: Head CT to evaluate dilated R pupil w/o CVA but bilateral sinus opacification Lung windows with persistent dense opacification Mediastinal CTA shows superior drainage cannula at SVC-RA junction. The return cannula has dropped 2 cm in the IVC  Under sterile prep and drape the cannula was freed from skin sutures and advance without the inner guide, secured to skin and sterile dressing applied. CXR pending Objective: Vital signs in last 24 hours: Temp:  [98.2 F (36.8 C)-98.7 F (37.1 C)] 98.7 F (37.1 C) (01/28 0800) Pulse Rate:  [79-121] 111 (01/28 1000) Cardiac Rhythm: Sinus tachycardia (01/28 0800) Resp:  [0-27] 23 (01/28 1130) BP: (183)/(69) 183/69 (01/27 1501) SpO2:  [79 %-100 %] 92 % (01/28 1232) Arterial Line BP: (91-189)/(39-85) 97/42 (01/28 1130) FiO2 (%):  [70 %-100 %] 70 % (01/28 1232) Weight:  [140.9 kg] 140.9 kg (01/28 0440)  Hemodynamic parameters for last 24 hours: CVP:  [1 mmHg-17 mmHg] 10 mmHg  Intake/Output from previous day: 01/27 0701 - 01/28 0700 In: 4585 [I.V.:2961.3; NG/GT:1523.7; IV Piggyback:100] Out: 4490 [Urine:3700; Emesis/NG output:120; Stool:560; Chest Tube:110] Intake/Output this shift: Total I/O In: 1853.7 [I.V.:1082.4; NG/GT:671.3; IV Piggyback:100] Out: 1120 [Urine:1100; Chest Tube:20]  EXAM  Distant breath sounds General edema Cannula sites secure  Lab Results: Recent Labs    09/04/19 1617 09/04/19 1902 09/05/19 0402 09/05/19 0402 09/05/19 0420 09/05/19 0725  WBC 10.5  --  13.0*  --   --   --   HGB 8.0*   < > 8.1*   < > 8.8* 9.5*  HCT 27.0*   < > 27.2*   < > 26.0* 28.0*  PLT 154  --  166  --   --   --    < > = values in this interval not displayed.   BMET:  Recent Labs    09/04/19 1617 09/04/19 1902 09/05/19 0402 09/05/19 0402 09/05/19 0420 09/05/19 0725  NA 146*   < > 148*   < > 150* 151*  K 3.3*   < > 3.8   <  > 3.8 4.4  CL 105  --  106  --   --   --   CO2 30  --  30  --   --   --   GLUCOSE 186*  --  168*  --   --   --   BUN 52*  --  57*  --   --   --   CREATININE 0.66  --  0.68  --   --   --   CALCIUM 8.8*  --  9.0  --   --   --    < > = values in this interval not displayed.    PT/INR: No results for input(s): LABPROT, INR in the last 72 hours. ABG    Component Value Date/Time   PHART 7.388 09/05/2019 0725   HCO3 33.6 (H) 09/05/2019 0725   TCO2 35 (H) 09/05/2019 0725   ACIDBASEDEF 2.0 08/28/2019 1519   O2SAT 89.0 09/05/2019 0725   CBG (last 3)  Recent Labs    09/04/19 1940 09/05/19 0051 09/05/19 0416  GLUCAP 163* 193* 153*    Assessment/Plan: S/P Procedure(s) (LRB): NASAL ENDOSCOPY WITH EPISTAXIS CONTROL WITH CAUTERIZATION (N/A) VV ECMO for Covid pneumonitis Cont current care   LOS: 45 days    [name] 09/05/2019

## 2019-09-05 NOTE — Progress Notes (Signed)
RT NOTE: RT transported patient from 2H04 to CT and back with RN x2, ECMO and transport. No complications. RT will continue to monitor.

## 2019-09-05 NOTE — Progress Notes (Signed)
ANTICOAGULATION CONSULT NOTE - Follow Up Consult  Pharmacy Consult for Heparin Indication: ECMO  Patient Measurements: Height: 5' 8"  (172.7 cm) Weight: (!) 328 lb 7.8 oz (149 kg) IBW/kg (Calculated) : 68.4 Heparin Dosing Weight: 104.5kg  Vital Signs: Temp: 97.5 F (36.4 C) (01/21 1600) Temp Source: Axillary (01/21 1600) Pulse Rate: 87 (01/21 1700)  Labs: Recent Labs    09/03/19 0448 09/03/19 0724 09/04/19 0416 09/04/19 0420 09/04/19 1617 09/04/19 1617 09/04/19 1902 09/04/19 1902 09/05/19 0401 09/05/19 0402 09/05/19 0420  HGB 8.7*   < > 8.4*   < > 8.0*   < > 9.5*   < >  --  8.1* 8.8*  HCT 29.2*   < > 29.0*   < > 27.0*   < > 28.0*  --   --  27.2* 26.0*  PLT 144*   < > 170  --  154  --   --   --   --  166  --   APTT 98*   < > 98*  --  87*  --   --   --   --  93*  --   HEPARINUNFRC 0.37   < > 0.36  --  0.41  --   --   --  0.43  --   --   CREATININE 0.79  --  0.75  --  0.66  --   --   --   --   --   --    < > = values in this interval not displayed.    Assessment: 44yo male on VV ECMO on heparin infusion.  -Heparin level therapeutic at 0.41, aPTT at 87s, on 2600 units/hr. Nosebleed appears stable, remains to be packed.   Discussed with CCM on evening rounds, heparin may have to be held for ~24h to allow nose to heal, will continue current care for now.   1/28 AM update:  Heparin level remains therapeutic Hgb low/stable  Goal of Therapy:  Heparin level 0.3-0.5 units/ml   Plan:  Continue IV heparin at 2600 units/hr. Monitor heparin level and CBC 5AM/5PM, daily LDH Monitor for any increase in bleeding from mouth/nose  Narda Bonds, PharmD, [place] Pharmacist Phone: [phone]

## 2019-09-05 NOTE — Progress Notes (Signed)
During this RN's assessment, it was noted that patient's right pupil was much larger than the left. Both were equally reactive. This was relayed to MDs and we will be going to CT.

## 2019-09-06 ENCOUNTER — Inpatient Hospital Stay (HOSPITAL_COMMUNITY): Payer: Managed Care, Other (non HMO)

## 2019-09-06 LAB — BASIC METABOLIC PANEL
Anion gap: 15 (ref 5–15)
Anion gap: 12 (ref 5–15)
BUN: 71 mg/dL — ABNORMAL HIGH (ref 6–20)
BUN: 79 mg/dL — ABNORMAL HIGH (ref 6–20)
CO2: 30 mmol/L (ref 22–32)
CO2: 28 mmol/L (ref 22–32)
Calcium: 9.2 mg/dL (ref 8.9–10.3)
Calcium: 9.1 mg/dL (ref 8.9–10.3)
Chloride: 104 mmol/L (ref 98–111)
Chloride: 110 mmol/L (ref 98–111)
Creatinine, Ser: 0.84 mg/dL (ref 0.61–1.24)
Creatinine, Ser: 0.94 mg/dL (ref 0.61–1.24)
GFR calc Af Amer: >60 mL/min (ref >60)
GFR calc Af Amer: >60 mL/min (ref >60)
GFR calc non Af Amer: >60 mL/min (ref >60)
GFR calc non Af Amer: >60 mL/min (ref >60)
Glucose, Bld: 214 mg/dL — ABNORMAL HIGH (ref 70–99)
Glucose, Bld: 204 mg/dL — ABNORMAL HIGH (ref 70–99)
Potassium: 3.3 mmol/L — ABNORMAL LOW (ref 3.5–5.1)
Potassium: 3.9 mmol/L (ref 3.5–5.1)
Sodium: 149 mmol/L — ABNORMAL HIGH (ref 135–145)
Sodium: 150 mmol/L — ABNORMAL HIGH (ref 135–145)

## 2019-09-06 LAB — POCT I-STAT 7, (LYTES, BLD GAS, ICA,H+H)
Acid-Base Excess: 7 mmol/L — ABNORMAL HIGH (ref 0.0–2.0)
Acid-Base Excess: 7 mmol/L — ABNORMAL HIGH (ref 0.0–2.0)
Acid-Base Excess: 5 mmol/L — ABNORMAL HIGH (ref 0.0–2.0)
Acid-Base Excess: 6 mmol/L — ABNORMAL HIGH (ref 0.0–2.0)
Acid-Base Excess: 5 mmol/L — ABNORMAL HIGH (ref 0.0–2.0)
Bicarbonate: 32.5 mmol/L — ABNORMAL HIGH (ref 20.0–28.0)
Bicarbonate: 33.1 mmol/L — ABNORMAL HIGH (ref 20.0–28.0)
Bicarbonate: 31.4 mmol/L — ABNORMAL HIGH (ref 20.0–28.0)
Bicarbonate: 31.7 mmol/L — ABNORMAL HIGH (ref 20.0–28.0)
Bicarbonate: 31.8 mmol/L — ABNORMAL HIGH (ref 20.0–28.0)
Calcium, Ion: 1.23 mmol/L (ref 1.15–1.40)
Calcium, Ion: 1.27 mmol/L (ref 1.15–1.40)
Calcium, Ion: 1.26 mmol/L (ref 1.15–1.40)
Calcium, Ion: 1.26 mmol/L (ref 1.15–1.40)
Calcium, Ion: 1.27 mmol/L (ref 1.15–1.40)
HCT: 27 % — ABNORMAL LOW (ref 39.0–52.0)
HCT: 29 % — ABNORMAL LOW (ref 39.0–52.0)
HCT: 25 % — ABNORMAL LOW (ref 39.0–52.0)
HCT: 26 % — ABNORMAL LOW (ref 39.0–52.0)
HCT: 27 % — ABNORMAL LOW (ref 39.0–52.0)
Hemoglobin: 9.2 g/dL — ABNORMAL LOW (ref 13.0–17.0)
Hemoglobin: 9.9 g/dL — ABNORMAL LOW (ref 13.0–17.0)
Hemoglobin: 8.5 g/dL — ABNORMAL LOW (ref 13.0–17.0)
Hemoglobin: 8.8 g/dL — ABNORMAL LOW (ref 13.0–17.0)
Hemoglobin: 9.2 g/dL — ABNORMAL LOW (ref 13.0–17.0)
O2 Saturation: 92 %
O2 Saturation: 91 %
O2 Saturation: 91 %
O2 Saturation: 90 %
O2 Saturation: 92 %
Patient temperature: 36.3
Patient temperature: 36.6
Patient temperature: 36.6
Patient temperature: 36.6
Patient temperature: 36.4
Potassium: 3.4 mmol/L — ABNORMAL LOW (ref 3.5–5.1)
Potassium: 3.8 mmol/L (ref 3.5–5.1)
Potassium: 4 mmol/L (ref 3.5–5.1)
Potassium: 3.8 mmol/L (ref 3.5–5.1)
Potassium: 3.7 mmol/L (ref 3.5–5.1)
Sodium: 150 mmol/L — ABNORMAL HIGH (ref 135–145)
Sodium: 149 mmol/L — ABNORMAL HIGH (ref 135–145)
Sodium: 150 mmol/L — ABNORMAL HIGH (ref 135–145)
Sodium: 146 mmol/L — ABNORMAL HIGH (ref 135–145)
Sodium: 151 mmol/L — ABNORMAL HIGH (ref 135–145)
TCO2: 34 mmol/L — ABNORMAL HIGH (ref 22–32)
TCO2: 35 mmol/L — ABNORMAL HIGH (ref 22–32)
TCO2: 33 mmol/L — ABNORMAL HIGH (ref 22–32)
TCO2: 33 mmol/L — ABNORMAL HIGH (ref 22–32)
TCO2: 34 mmol/L — ABNORMAL HIGH (ref 22–32)
pCO2 arterial: 51.4 mmHg — ABNORMAL HIGH (ref 32.0–48.0)
pCO2 arterial: 56.8 mmHg — ABNORMAL HIGH (ref 32.0–48.0)
pCO2 arterial: 55 mmHg — ABNORMAL HIGH (ref 32.0–48.0)
pCO2 arterial: 53.9 mmHg — ABNORMAL HIGH (ref 32.0–48.0)
pCO2 arterial: 54.7 mmHg — ABNORMAL HIGH (ref 32.0–48.0)
pH, Arterial: 7.406 (ref 7.350–7.450)
pH, Arterial: 7.372 (ref 7.350–7.450)
pH, Arterial: 7.363 (ref 7.350–7.450)
pH, Arterial: 7.375 (ref 7.350–7.450)
pH, Arterial: 7.37 (ref 7.350–7.450)
pO2, Arterial: 63 mmHg — ABNORMAL LOW (ref 83.0–108.0)
pO2, Arterial: 62 mmHg — ABNORMAL LOW (ref 83.0–108.0)
pO2, Arterial: 65 mmHg — ABNORMAL LOW (ref 83.0–108.0)
pO2, Arterial: 61 mmHg — ABNORMAL LOW (ref 83.0–108.0)
pO2, Arterial: 66 mmHg — ABNORMAL LOW (ref 83.0–108.0)

## 2019-09-06 LAB — CULTURE, BLOOD (ROUTINE X 2)
Culture: NO GROWTH
Culture: NO GROWTH

## 2019-09-06 LAB — CBC
HCT: 29.4 % — ABNORMAL LOW (ref 39.0–52.0)
HCT: 25.9 % — ABNORMAL LOW (ref 39.0–52.0)
HCT: 29.6 % — ABNORMAL LOW (ref 39.0–52.0)
Hemoglobin: 8.7 g/dL — ABNORMAL LOW (ref 13.0–17.0)
Hemoglobin: 7.6 g/dL — ABNORMAL LOW (ref 13.0–17.0)
Hemoglobin: 8.7 g/dL — ABNORMAL LOW (ref 13.0–17.0)
MCH: 28.5 pg (ref 26.0–34.0)
MCH: 28.6 pg (ref 26.0–34.0)
MCH: 27.9 pg (ref 26.0–34.0)
MCHC: 29.6 g/dL — ABNORMAL LOW (ref 30.0–36.0)
MCHC: 29.3 g/dL — ABNORMAL LOW (ref 30.0–36.0)
MCHC: 29.4 g/dL — ABNORMAL LOW (ref 30.0–36.0)
MCV: 96.4 fL (ref 80.0–100.0)
MCV: 97.4 fL (ref 80.0–100.0)
MCV: 94.9 fL (ref 80.0–100.0)
Platelets: 181 10*3/uL (ref 150–400)
Platelets: 130 10*3/uL — ABNORMAL LOW (ref 150–400)
Platelets: 136 10*3/uL — ABNORMAL LOW (ref 150–400)
RBC: 3.05 MIL/uL — ABNORMAL LOW (ref 4.22–5.81)
RBC: 2.66 MIL/uL — ABNORMAL LOW (ref 4.22–5.81)
RBC: 3.12 MIL/uL — ABNORMAL LOW (ref 4.22–5.81)
RDW: 18.7 % — ABNORMAL HIGH (ref 11.5–15.5)
RDW: 18.7 % — ABNORMAL HIGH (ref 11.5–15.5)
RDW: 18.7 % — ABNORMAL HIGH (ref 11.5–15.5)
WBC: 14.1 10*3/uL — ABNORMAL HIGH (ref 4.0–10.5)
WBC: 10.2 10*3/uL (ref 4.0–10.5)
WBC: 11.2 10*3/uL — ABNORMAL HIGH (ref 4.0–10.5)
nRBC: 3.7 % — ABNORMAL HIGH (ref 0.0–0.2)
nRBC: 3.6 % — ABNORMAL HIGH (ref 0.0–0.2)
nRBC: 3.7 % — ABNORMAL HIGH (ref 0.0–0.2)

## 2019-09-06 LAB — GLUCOSE, CAPILLARY
Glucose-Capillary: 193 mg/dL — ABNORMAL HIGH (ref 70–99)
Glucose-Capillary: 197 mg/dL — ABNORMAL HIGH (ref 70–99)
Glucose-Capillary: 203 mg/dL — ABNORMAL HIGH (ref 70–99)
Glucose-Capillary: 165 mg/dL — ABNORMAL HIGH (ref 70–99)
Glucose-Capillary: 184 mg/dL — ABNORMAL HIGH (ref 70–99)
Glucose-Capillary: 185 mg/dL — ABNORMAL HIGH (ref 70–99)
Glucose-Capillary: 182 mg/dL — ABNORMAL HIGH (ref 70–99)
Glucose-Capillary: 187 mg/dL — ABNORMAL HIGH (ref 70–99)

## 2019-09-06 LAB — HEPARIN LEVEL (UNFRACTIONATED)
Heparin Unfractionated: 0.39 IU/mL (ref 0.30–0.70)
Heparin Unfractionated: <0.1 IU/mL — ABNORMAL LOW (ref 0.30–0.70)
Heparin Unfractionated: 0.21 IU/mL — ABNORMAL LOW (ref 0.30–0.70)

## 2019-09-06 LAB — LACTATE DEHYDROGENASE: LDH: 503 U/L — ABNORMAL HIGH (ref 98–192)

## 2019-09-06 LAB — LACTIC ACID, PLASMA: Lactic Acid, Venous: 1.3 mmol/L (ref 0.5–1.9)

## 2019-09-06 LAB — COOXEMETRY PANEL
Carboxyhemoglobin: 3.3 % — ABNORMAL HIGH (ref 0.5–1.5)
Methemoglobin: 0.8 % (ref 0.0–1.5)
O2 Saturation: 99.3 %
Total hemoglobin: 9.3 g/dL — ABNORMAL LOW (ref 12.0–16.0)

## 2019-09-06 LAB — APTT
aPTT: 106 seconds — ABNORMAL HIGH (ref 24–36)
aPTT: 44 seconds — ABNORMAL HIGH (ref 24–36)

## 2019-09-06 LAB — TRIGLYCERIDES: Triglycerides: 185 mg/dL — ABNORMAL HIGH (ref <150)

## 2019-09-06 LAB — PREPARE RBC (CROSSMATCH)

## 2019-09-06 MED ORDER — POTASSIUM CHLORIDE 20 MEQ/15ML (10%) PO SOLN
40.0000 meq | Freq: Once | ORAL | Status: AC
  Administered 2019-09-06: 40 meq
  Filled 2019-09-06: qty 30

## 2019-09-06 MED ORDER — POTASSIUM CHLORIDE 20 MEQ/15ML (10%) PO SOLN
40.0000 meq | ORAL | Status: AC
  Administered 2019-09-06 (×2): 40 meq
  Filled 2019-09-06 (×2): qty 30

## 2019-09-06 MED ORDER — SODIUM CHLORIDE 0.9% IV SOLUTION: Freq: Once | INTRAVENOUS | Status: AC

## 2019-09-06 MED ORDER — TRANEXAMIC ACID 1000 MG/10ML IV SOLN
500.0000 mg | Freq: Once | INTRAVENOUS | Status: AC
  Administered 2019-09-06: 500 mg via TOPICAL
  Filled 2019-09-06 (×2): qty 10

## 2019-09-06 MED ORDER — IPRATROPIUM-ALBUTEROL 0.5-2.5 (3) MG/3ML IN SOLN
3.0000 mL | Freq: Four times a day (QID) | RESPIRATORY_TRACT | Status: DC
  Administered 2019-09-06 – 2019-09-07 (×2): 3 mL via RESPIRATORY_TRACT
  Filled 2019-09-06 (×2): qty 3

## 2019-09-06 MED ORDER — HEPARIN (PORCINE) 25000 UT/250ML-% IV SOLN
1650.0000 [IU]/h | INTRAVENOUS | Status: AC
  Administered 2019-09-06: 2500 [IU]/h via INTRAVENOUS
  Administered 2019-09-07 – 2019-09-09 (×6): 2550 [IU]/h via INTRAVENOUS
  Administered 2019-09-10: 2500 [IU]/h via INTRAVENOUS
  Administered 2019-09-11: 2400 [IU]/h via INTRAVENOUS
  Administered 2019-09-11: 2500 [IU]/h via INTRAVENOUS
  Administered 2019-09-12: 2300 [IU]/h via INTRAVENOUS
  Administered 2019-09-12: 2100 [IU]/h via INTRAVENOUS
  Administered 2019-09-13 – 2019-09-14 (×2): 1800 [IU]/h via INTRAVENOUS
  Administered 2019-09-14: 1850 [IU]/h via INTRAVENOUS
  Administered 2019-09-15: 1900 [IU]/h via INTRAVENOUS
  Administered 2019-09-15 – 2019-09-18 (×6): 2000 [IU]/h via INTRAVENOUS
  Administered 2019-09-18: 1900 [IU]/h via INTRAVENOUS
  Administered 2019-09-19: 2000 [IU]/h via INTRAVENOUS
  Administered 2019-09-20: 1950 [IU]/h via INTRAVENOUS
  Administered 2019-09-20 – 2019-09-22 (×4): 1900 [IU]/h via INTRAVENOUS
  Administered 2019-09-22 – 2019-09-23 (×2): 1700 [IU]/h via INTRAVENOUS
  Administered 2019-09-24: 21:00:00 1600 [IU]/h via INTRAVENOUS
  Administered 2019-09-24 – 2019-09-25 (×2): 1700 [IU]/h via INTRAVENOUS
  Administered 2019-09-26: 2000 [IU]/h via INTRAVENOUS
  Administered 2019-09-26: 1850 [IU]/h via INTRAVENOUS
  Administered 2019-09-27: 1950 [IU]/h via INTRAVENOUS
  Administered 2019-09-27: 2000 [IU]/h via INTRAVENOUS
  Administered 2019-09-28: 1700 [IU]/h via INTRAVENOUS
  Administered 2019-09-29: 1500 [IU]/h via INTRAVENOUS
  Administered 2019-09-29: 1650 [IU]/h via INTRAVENOUS
  Administered 2019-09-30 – 2019-10-01 (×3): 1500 [IU]/h via INTRAVENOUS
  Administered 2019-10-02 – 2019-10-03 (×2): 1550 [IU]/h via INTRAVENOUS
  Administered 2019-10-04: 1650 [IU]/h via INTRAVENOUS
  Administered 2019-10-04: 1600 [IU]/h via INTRAVENOUS
  Administered 2019-10-05 – 2019-10-07 (×4): 1650 [IU]/h via INTRAVENOUS
  Filled 2019-09-06 (×55): qty 250

## 2019-09-06 NOTE — CV Procedure (Signed)
ECMO NOTE:  Indication: Respiratory failure due to COVID PNA  Initial cannulation date: 08/03/2019  ECMO type: VV ECMO (Cardio-Help)  Return cannula:  1) 21 FR RFV in RA  Inflow Cannulas:   1) 19 FR RIJ 2) 25 FR  LFV  ECMO events:  Initial cannulation 12/26 Circuit changed and trach exchanged on 1/8.   Underwent addition of a RIJ drainage cannula on 1/20 Circuit changed on 1/22 for rising LDH Had evidence of recirculation and RIJ cannula pulled back on 1/26 & 1/28  Will plan removal of RIJ cannula at bedside later today   Daily data:  Remains on VV ecmo with good flows.   Flow 4.48 RPM 3525 dP 35 Pven  -57 Sweep 14.5  SVO2 79%  Labs:  Hgb 9.9 LDH 450 -> 481 -> 503 Heparin level: 0.39   Glori Bickers, MD  8:49 AM

## 2019-09-06 NOTE — Progress Notes (Signed)
NAME:  [name], MRN:  [id number], DOB:  May 22, 1976, LOS: 12 ADMISSION DATE:  07/22/2019, CONSULTATION DATE:  12/24 REFERRING MD:  Sloan Leiter, CHIEF COMPLAINT:  Dyspnea   Brief History   44 y/o M admitted 12/14 with COVID pneumonia causing acute hypoxemic respiratory failure. Developed pneumomediastinum 12/23 with concern for possible bilateral small pneumothoraces on 12/24.  PCCM consulted for evaluation of pneumothoraces  Past Medical History  GERD HTN Asthma  Significant Hospital Events   12/14 admit with hypoxemic respiratory failure in setting of COVID-19 pneumonia 12/23 noted to have pneumomediastinum on chest x-ray 12/24 PCCM consulted for evaluation 12/26 worsening hypoxemia, intubated  12/27 VV fem/fem ECMO cannulation See bedside nursing event notes for full rundown of procedures after 12/27   Consults:  PCCM, CTS, CHF, Palliative  Procedures:  See bedside nursing event notes for full rundown of procedures.  Significant Diagnostic Tests:  CT chest 12/22 >> extensive pneumomediastinum, multifocal patchy bilateral groundglass opacities CXR 1/25 >> improving bilateral infiltrates. ECMO cannulae in place.  Micro Data:  BCx2 12/14 >> negative  Sputum 12/15 >> negative  1/9 bronch: serratia and MSSA 1/2 acid fast smear bronch: negative 1/2 fungal cx bronch: pending 1/12 BAL: serratia 1/18 BAL >> rare Serratia.  Antimicrobials:  Received remdesivir, steroids, actemra and convalescent plasma  5 days cefepime 12/24-12/29 Cefepime 1/4->1/14 vanc 1/4->1/14 Ceftriaxone 1/14 >>   Interim history/subjective:  Improving oxygenation. Continues to have substantial nasal bleeding. Decreased sedation this morning. Increasing recirculation.  Objective   Blood pressure (!) 144/48, pulse 96, temperature (!) 97.3 F (36.3 C), temperature source Other (Comment), resp. rate (!) 23, height _0  (1.727 m), weight (!) 144.1 kg, SpO2 95 %. CVP:  [4 mmHg-11 mmHg] 7 mmHg  Vent  Mode: PCV FiO2 (%):  [60 %-80 %] 80 % Set Rate:  [15 bmp] 15 bmp PEEP:  [15 cmH20] 15 cmH20 Plateau Pressure:  [26 cmH20-30 cmH20] 26 cmH20   Intake/Output Summary (Last 24 hours) at 09/06/2019 0948 Last data filed at 09/06/2019 0600 Gross per 24 hour  Intake 6787.63 ml  Output 4935 ml  Net 1852.63 ml   Filed Weights   09/03/19 0600 09/05/19 0440 09/06/19 0500  Weight: (!) 145.9 kg (!) 140.9 kg (!) 144.1 kg   Examination GEN: obese ill man on vent HEENT: trach in place, bleeding from the left nostril predominantly. Packing in place. CV: RRR, ext warm PULM: improving air movement with anterior vesicular breath sounds, bronchial to anterior axillary line. More active belly breathing. GI: Soft, + BS EXT: Global anascarca has improved significantly - mild statis dermatitis of LLE. Appears more edematous compared to 3 days ago NEURO: sedated, NMB off. Opens eyes and blinks, pupils are reactive. Occasionally orients to voice PSYCH: RASS -3 SKIN: pale Femoral cannulas in place, sites looks okay Bilateral chest tubes in place, no tidaling or air leak, small amount. ongoing serosanguinous output Foley in place dark yellow urine Rectal tube in place with tan loose stool   CXR increased consolidation of basilar lung fields   Now better able to see the diaphragms. Continued dense consolidation of left base.  Flow (LPM): 4.48 ECMO Mode: VV ECMO Device: Cardiohelp  $ Ventilator Initial/Subsequent : Subsequent, Vent Mode: PCV, Set Rate: 15 bmp, FiO2 (%): 80 %, I Time: 0.75 Sec(s), PEEP: 15 cmH20 Vt today 293m  Net IO Since Admission: 35,437.51 mL [09/06/19 0948]  Resolved Hospital Problem list   MSSA and serratia marcescens pneumonia   Assessment & Plan:  Acute hypoxemic respiratory failure with ARDS secondary to COVID-19, s/p tracheostomy requiring mechanical ventilation and VV ECMO support.  COVID has been treated with all evidence based therapies, we are dealing with lingering  severe diffuse alveolar damage. Marked improvement in aeration of both lungs. Recirculation likely due to migration of return cannula. SVC cannula repositioned. Continue to wean sedation and allow patient participation. Resedate only if gas exchange is significantly impaired.  Posterior epistaxis.  Repacked multiple times  Cortrack removed and epistaxis appears to have resolved. Left nostril repacked with thrombi-pad. Petrolatum jelly to right to keep moist. Will try topical TXA  Daily Goals Checklist  Pain/Anxiety/Delirium protocol (if indicated): Increase oral regimen. Wean Versed off. Continue with Dilaudid and Precedex.  Restart low dose quetiapine. VAP protocol (if indicated): Bundle in place. Respiratory support goals: Continue PCV with ultra-lung protective ventilation, generous PEEP.  PEEP currently optimized. Continue to oversleep as may help with air hunger during sedation wean. Blood pressure target:  MAP>65, off pressors ECMO SVC cannula pulled back 2cm  DVT prophylaxis: on systemic anticoagulation on VV-ECMO - continue to current targets in spite of bleeding given circuit performance issues.  Nutrition Status: Nutrition Problem: Increased nutrient needs Etiology: acute illness(COVID PNA) Signs/Symptoms: estimated needs Interventions: Tube feeding, Prostat GI prophylaxis: Protonix per tube. Fluid status goals: continue  furosemide infusion. Keep 1.5-2.0L/day negative. Urinary catheter: Guide hemodynamic management Glucose control: euglycemic on SSI. Mobility/therapy needs: Bedrest.  Daily labs: as per ECMO protocol. Code Status: Full code  Family Communication:family updated by RN. Disposition: ICU   Labs   CBC: CBC Latest Ref Rng & Units 09/06/2019 09/06/2019 09/06/2019  WBC 4.0 - 10.5 K/uL - - 14.1(H)  Hemoglobin 13.0 - 17.0 g/dL 9.9(L) 9.2(L) 8.7(L)  Hematocrit 39.0 - 52.0 % 29.0(L) 27.0(L) 29.4(L)  Platelets 150 - 400 K/uL - - 419    Basic Metabolic  Panel: BMP Latest Ref Rng & Units 09/06/2019 09/06/2019 09/06/2019  Glucose 70 - 99 mg/dL - - 214(H)  BUN 6 - 20 mg/dL - - 71(H)  Creatinine 0.61 - 1.24 mg/dL - - 0.84  Sodium 135 - 145 mmol/L 149(H) 150(H) 149(H)  Potassium 3.5 - 5.1 mmol/L 3.8 3.4(L) 3.3(L)  Chloride 98 - 111 mmol/L - - 104  CO2 22 - 32 mmol/L - - 30  Calcium 8.9 - 10.3 mg/dL - - 9.2    GFR: Estimated Creatinine Clearance: 155 mL/min (by C-G formula based on SCr of 0.84 mg/dL). Recent Labs  Lab 09/03/19 0448 09/03/19 1618 09/04/19 0416 09/04/19 1617 09/05/19 0401 09/05/19 0402 09/05/19 1612 09/05/19 2125 09/06/19 0405  WBC 10.7*   < > 12.3*   < >  --  13.0* 13.6* 12.5* 14.1*  LATICACIDVEN 1.2  --  1.1  --  1.1  --   --   --  1.3   < > = values in this interval not displayed.    Liver Function Tests: Recent Labs  Lab 08/31/19 1150  AST 24  ALT 26  ALKPHOS 75  BILITOT 0.7  PROT 6.2*  ALBUMIN 2.2*   No results for input(s): LIPASE, AMYLASE in the last 168 hours. No results for input(s): AMMONIA in the last 168 hours.  ABG    Component Value Date/Time   PHART 7.372 09/06/2019 0810   PCO2ART 56.8 (H) 09/06/2019 0810   PO2ART 62.0 (L) 09/06/2019 0810   HCO3 33.1 (H) 09/06/2019 0810   TCO2 35 (H) 09/06/2019 0810   ACIDBASEDEF 2.0 08/28/2019 1519   O2SAT 91.0 09/06/2019 0810  Coagulation Profile: No results for input(s): INR, PROTIME in the last 168 hours.  Cardiac Enzymes: No results for input(s): CKTOTAL, CKMB, CKMBINDEX, TROPONINI in the last 168 hours.  HbA1C: Hgb A1c MFr Bld  Date/Time Value Ref Range Status  07/24/2019 04:50 AM 6.7 (H) 4.8 - 5.6 % Final    Comment:    (NOTE) Pre diabetes:          5.7%-6.4% Diabetes:              >6.4% Glycemic control for   <7.0% adults with diabetes   09/30/2017 05:36 AM 5.7 (H) 4.8 - 5.6 % Final    Comment:    (NOTE)         Prediabetes: 5.7 - 6.4         Diabetes: >6.4         Glycemic control for adults with diabetes: <7.0      CBG: Recent Labs  Lab 09/05/19 1612 09/05/19 1933 09/06/19 0020 09/06/19 0420 09/06/19 0754  GLUCAP 240* 193* 203* 197* 165*    CRITICAL CARE Performed by: Kipp Brood   Total critical care time: 50 minutes  Critical care time was exclusive of separately billable procedures and treating other patients.  Critical care was necessary to treat or prevent imminent or life-threatening deterioration.  Critical care was time spent personally by me on the following activities: development of treatment plan with patient and/or surrogate as well as nursing, discussions with consultants, evaluation of patient's response to treatment, examination of patient, obtaining history from patient or surrogate, ordering and performing treatments and interventions, ordering and review of laboratory studies, ordering and review of radiographic studies, pulse oximetry, re-evaluation of patient's condition and participation in multidisciplinary rounds.  Kipp Brood, MD [facility] ICU Physician [place]  Pager: [phone] Mobile: [phone] After hours: [phone].     09/06/2019, 9:48 AM

## 2019-09-06 NOTE — Progress Notes (Signed)
Patient ID: [name], male   DOB: [birth year], 44 y.o.   MRN: [id number]    Advanced Heart Failure Rounding Note   Subjective:    Circuit changed and trach exchanged on 1/8.   Underwent addition of a RIJ drainage cannula on 1/20 Circuit changed on 1/22 for rising LDH Had evidence of recirculation and RIJ cannula pulled back on 1/26  Remains on VV ecmo with good flows.   Continues to have epistaxis but seems to be slowing a bit  Lasix gtt increased to 11 with good urine output.   Continues to have recirsulation off IJ cannula (I puled back 2 cm last night) but sats still in mid 90s. TVs up to 350.   ABG 7.37/57/62/91%  Co-ox 99%   Remains on lasix gtt at 11/hr.   CXR: Diffuse bilateral lung opacities - mildly improved. Personally reviewed   On vent now at 80% PEEP 15. PlatP 30, Bi-vent setting.TVs ~350cc Personally reviewed  hgb 9.9 LDH 480 -> 445 -> 432-> 450 -> 481 -> 503  ECMO parameters - see separate ecmo note   Objective:   Weight Range:  Vital Signs:   Temp:  [97.3 F (36.3 C)-98.7 F (37.1 C)] 97.3 F (36.3 C) (01/29 0400) Pulse Rate:  [70-118] 100 (01/29 0700) Resp:  [0-26] 23 (01/29 0700) BP: (118-143)/(42-54) 136/47 (01/29 0400) SpO2:  [81 %-100 %] 97 % (01/29 0822) Arterial Line BP: (88-168)/(34-66) 150/54 (01/29 0700) FiO2 (%):  [60 %-80 %] 80 % (01/29 0822) Weight:  [144.1 kg] 144.1 kg (01/29 0500) Last BM Date: 09/05/19  Weight change: Filed Weights   09/03/19 0600 09/05/19 0440 09/06/19 0500  Weight: (!) 145.9 kg (!) 140.9 kg (!) 144.1 kg    Intake/Output:   Intake/Output Summary (Last 24 hours) at 09/06/2019 0840 Last data filed at 09/06/2019 0600 Gross per 24 hour  Intake 6987.38 ml  Output 4935 ml  Net 2052.38 ml     Physical Exam: General:  Critically ill appearing  Sedated on vent HEENT: nasal packing  Neck: supple.RIJ cannula ok . Carotids 2+ bilat; no bruits. No lymphadenopathy or thryomegaly appreciated. Cor: PMI  nondisplaced. Regular tachy. No rubs, gallops or murmurs. Lungs: coarse  Abdomen: obese soft, nontender, nondistended. No hepatosplenomegaly. No bruits or masses. Good bowel sounds. Extremities: no cyanosis, clubbing, rash, 3+ edema Femoral cannuals ok  Neuro: sedated   Telemetry: Sinus tach 100-110s  Personally reviewed  Labs: Basic Metabolic Panel: Recent Labs  Lab 08/31/19 0404 08/31/19 0421 08/31/19 1150 08/31/19 1630 09/01/19 0414 09/01/19 0425 09/03/19 0448 09/03/19 0724 09/04/19 0416 09/04/19 0420 09/04/19 1617 09/04/19 1902 09/05/19 0402 09/05/19 0420 09/05/19 1615 09/05/19 1932 09/06/19 0405 09/06/19 0412 09/06/19 0810  NA 143   < > 145   < > 145   < > 145   < > 144   < > 146*   < > 148*   < > 149* 149* 149* 150* 149*  K 3.6   < > 3.6   < > 4.0   < > 4.0   < > 3.5   < > 3.3*   < > 3.8   < > 3.9 3.6 3.3* 3.4* 3.8  CL 102   < > 104   < > 106   < > 105  --  102  --  105  --  106  --   --   --  104  --   --   CO2 29   < > 29   < >  28   < > 28  --  30  --  30  --  30  --   --   --  30  --   --   GLUCOSE 192*   < > 191*   < > 177*   < > 160*  --  151*  --  186*  --  168*  --   --   --  214*  --   --   BUN 33*   < > 31*   < > 35*   < > 44*  --  46*  --  52*  --  57*  --   --   --  71*  --   --   CREATININE 0.74   < > 0.65   < > 0.55*   < > 0.79  --  0.75  --  0.66  --  0.68  --   --   --  0.84  --   --   CALCIUM 8.6*   < > 8.6*   < > 9.0   < > 9.4   < > 9.3   < > 8.8*  --  9.0  --   --   --  9.2  --   --   MG 1.5*  --  2.3  --  2.3  --   --   --   --   --   --   --   --   --   --   --   --   --   --   PHOS  --   --   --   --  5.4*  --   --   --   --   --   --   --   --   --   --   --   --   --   --    < > = values in this interval not displayed.    Liver Function Tests: Recent Labs  Lab 08/31/19 1150  AST 24  ALT 26  ALKPHOS 75  BILITOT 0.7  PROT 6.2*  ALBUMIN 2.2*   No results for input(s): LIPASE, AMYLASE in the last 168 hours. No results for input(s):  AMMONIA in the last 168 hours.  CBC: Recent Labs  Lab 09/04/19 1617 09/04/19 1902 09/05/19 0402 09/05/19 0420 09/05/19 1612 09/05/19 1615 09/05/19 1932 09/05/19 2125 09/06/19 0405 09/06/19 0412 09/06/19 0810  WBC 10.5  --  13.0*  --  13.6*  --   --  12.5* 14.1*  --   --   HGB 8.0*   < > 8.1*   < > 8.1*   < > 8.5* 7.8* 8.7* 9.2* 9.9*  HCT 27.0*   < > 27.2*   < > 27.9*   < > 25.0* 26.1* 29.4* 27.0* 29.0*  MCV 94.7  --  95.4  --  96.2  --   --  95.6 96.4  --   --   PLT 154  --  166  --  183  --   --  152 181  --   --    < > = values in this interval not displayed.    Cardiac Enzymes: No results for input(s): CKTOTAL, CKMB, CKMBINDEX, TROPONINI in the last 168 hours.  BNP: BNP (last 3 results) Recent Labs    07/22/19 1039  BNP 15.3    ProBNP (last 3 results) No results for input(s): PROBNP in  the last 8760 hours.    Other results:  Imaging: CT ABDOMEN PELVIS WO CONTRAST  Result Date: 09/05/2019 CLINICAL DATA:  Abdominal pain and distention. COVID-19 pneumonia. EXAM: CT CHEST, ABDOMEN AND PELVIS WITHOUT CONTRAST TECHNIQUE: Multidetector CT imaging of the chest, abdomen and pelvis was performed following the standard protocol without IV contrast. COMPARISON:  CT scan of the abdomen dated 04/13/2019 FINDINGS: CT CHEST FINDINGS Cardiovascular: Tracheostomy tube, feeding tube and 3 central venous catheters in place in the chest. Bilateral chest tubes. Heart size is normal. No pericardial effusion. Mediastinum/Nodes: No significant axillary, hilar or mediastinal adenopathy. Thyroid gland, trachea, and esophagus are normal. Lungs/Pleura: There is dense consolidative pneumonia throughout both lungs. There is very little remaining aerated lung. No effusions. No pneumothorax. Musculoskeletal: No chest wall mass or suspicious bone lesions identified. CT ABDOMEN PELVIS FINDINGS Hepatobiliary: No focal liver abnormality is seen. No gallstones, gallbladder wall thickening, or biliary  dilatation. Pancreas: Unremarkable. No pancreatic ductal dilatation or surrounding inflammatory changes. Spleen: Normal in size without focal abnormality. Adrenals/Urinary Tract: Adrenal glands and kidneys are normal. No hydronephrosis. Foley catheter in the bladder. Small amount of air in the bladder. Stomach/Bowel: Stomach is within normal limits. Appendix appears normal. No evidence of bowel wall thickening, distention, or inflammatory changes. Drain in the rectum. Feeding tube in the fourth portion of the duodenum. Vascular/Lymphatic: Bilateral femoral vein catheters are in place with the tips in the upper inferior vena cava. No adenopathy. Reproductive: Prostate is unremarkable. Other: No ascites or free air. Musculoskeletal: Slight subcutaneous edema in the proximal left thigh and in the subcutaneous fat at the left lower quadrant of the abdomen of unknown etiology. No significant bone abnormality. IMPRESSION: 1. Dense consolidative pneumonia throughout both lungs. 2. No acute abnormality of the abdomen or pelvis. 3. Slight subcutaneous edema in the proximal left thigh and in the subcutaneous fat at the left lower quadrant of the abdomen of unknown etiology. Electronically Signed   By: Lorriane Shire M.D.   On: 09/05/2019 10:15   CT HEAD WO CONTRAST  Result Date: 09/05/2019 CLINICAL DATA:  Cerebral hemorrhage suspected. History of COVID-19 pneumonia with cardiac and respiratory failure on ECMO. Nose bleeds. EXAM: CT HEAD WITHOUT CONTRAST TECHNIQUE: Contiguous axial images were obtained from the base of the skull through the vertex without intravenous contrast. COMPARISON:  None. FINDINGS: Brain: There is no evidence of acute infarct, intracranial hemorrhage, mass, midline shift, or extra-axial fluid collection. The ventricles and sulci are normal. Vascular: No hyperdense vessel. Skull: No fracture or suspicious osseous lesion. Sinuses/Orbits: Complete opacification of the paranasal sinuses, mastoid air  cells, and tympanic cavities bilaterally. Unremarkable orbits. Other: Partially visualized feeding tube. IMPRESSION: 1. Unremarkable CT appearance of the brain. 2. Pansinusitis.  Large bilateral mastoid and middle ear effusions. Electronically Signed   By: Logan Bores M.D.   On: 09/05/2019 10:03   CT CHEST WO CONTRAST  Result Date: 09/05/2019 CLINICAL DATA:  Abdominal pain and distention. COVID-19 pneumonia. EXAM: CT CHEST, ABDOMEN AND PELVIS WITHOUT CONTRAST TECHNIQUE: Multidetector CT imaging of the chest, abdomen and pelvis was performed following the standard protocol without IV contrast. COMPARISON:  CT scan of the abdomen dated 04/13/2019 FINDINGS: CT CHEST FINDINGS Cardiovascular: Tracheostomy tube, feeding tube and 3 central venous catheters in place in the chest. Bilateral chest tubes. Heart size is normal. No pericardial effusion. Mediastinum/Nodes: No significant axillary, hilar or mediastinal adenopathy. Thyroid gland, trachea, and esophagus are normal. Lungs/Pleura: There is dense consolidative pneumonia throughout both lungs. There is very little  remaining aerated lung. No effusions. No pneumothorax. Musculoskeletal: No chest wall mass or suspicious bone lesions identified. CT ABDOMEN PELVIS FINDINGS Hepatobiliary: No focal liver abnormality is seen. No gallstones, gallbladder wall thickening, or biliary dilatation. Pancreas: Unremarkable. No pancreatic ductal dilatation or surrounding inflammatory changes. Spleen: Normal in size without focal abnormality. Adrenals/Urinary Tract: Adrenal glands and kidneys are normal. No hydronephrosis. Foley catheter in the bladder. Small amount of air in the bladder. Stomach/Bowel: Stomach is within normal limits. Appendix appears normal. No evidence of bowel wall thickening, distention, or inflammatory changes. Drain in the rectum. Feeding tube in the fourth portion of the duodenum. Vascular/Lymphatic: Bilateral femoral vein catheters are in place with the tips  in the upper inferior vena cava. No adenopathy. Reproductive: Prostate is unremarkable. Other: No ascites or free air. Musculoskeletal: Slight subcutaneous edema in the proximal left thigh and in the subcutaneous fat at the left lower quadrant of the abdomen of unknown etiology. No significant bone abnormality. IMPRESSION: 1. Dense consolidative pneumonia throughout both lungs. 2. No acute abnormality of the abdomen or pelvis. 3. Slight subcutaneous edema in the proximal left thigh and in the subcutaneous fat at the left lower quadrant of the abdomen of unknown etiology. Electronically Signed   By: Lorriane Shire M.D.   On: 09/05/2019 10:15   DG Chest Port 1 View  Result Date: 09/06/2019 CLINICAL DATA:  ECMO EXAM: PORTABLE CHEST 1 VIEW COMPARISON:  Radiograph 09/05/2019 FINDINGS: Right IJ ECMO cannula tip appears slightly retracted from 1 day prior, now positioned in the mid SVC. IVC cannula tip terminates at the level of the right atrium. Endotracheal tube remains in the mid trachea, 3.7 cm from the carina. Transesophageal feeding tube tip is below the level of imaging. Bilateral chest tubes are again seen. Minimal interval clearing of the dense bilateral airspace opacities in both lungs. No new areas of opacity are seen. No visible pneumothorax or effusion. Stable cardiomegaly though much of the heart is obscured by overlying opacity. IMPRESSION: 1. Right IJ ECMO cannula tip appears slightly retracted from 1 day prior, now positioned in the mid SVC. 2. Remaining lines and tubes appear unchanged in position. 3. Minimal interval clearing of the dense bilateral airspace opacities. Electronically Signed   By: Lovena Le M.D.   On: 09/06/2019 05:32   DG Chest Port 1 View  Result Date: 09/05/2019 CLINICAL DATA:  ECMO cannula advancement. EXAM: PORTABLE CHEST 1 VIEW COMPARISON:  CT chest 09/05/2019.  Chest x-ray 09/05/2019. FINDINGS: Right IJ ECMO cannula tip noted over the cavoatrial junction. IVC cannula  noted over the IVC right atrial junction. Tracheostomy tube, feeding tube, right PICC line, bilateral chest tubes in stable position. No pneumothorax. Diffuse dense bilateral pulmonary infiltrates/edema again noted. Cardiomegaly again noted. IMPRESSION: 1. Right IJ ECMO cannula tip noted over the cavoatrial junction. IVC can noted over the IVC right atrial junction. Remaining lines and tubes including bilateral chest tubes in stable position. No pneumothorax. 2. Diffuse dense bilateral pulmonary infiltrates/edema again noted without interim change. Electronically Signed   By: Marcello Moores  Register   On: 09/05/2019 13:21   DG CHEST PORT 1 VIEW  Result Date: 09/05/2019 CLINICAL DATA:  ECMO EXAM: PORTABLE CHEST 1 VIEW COMPARISON:  Radiograph 09/04/2019 FINDINGS: Bilateral chest tubes are in place. Large bore right IJ approach ECMO cannula and IVC cannula remain in similar position to comparison study. A transesophageal feeding tube tip terminates below the level of the GE junction. Widespread opacities throughout both lungs are similar to comparison accounting for  differences in technique. The cardiomediastinal contours are obscured by overlying opacity. No visible pneumothorax. Suspect at least some pleural fluid on the left. No acute osseous or soft tissue abnormality. IMPRESSION: 1. No significant change in widespread opacities throughout both lungs. Suspect some left effusion. 2. Stable lines and tubes. 3. No visible pneumothorax. Electronically Signed   By: Lovena Le M.D.   On: 09/05/2019 05:49   DG CHEST PORT 1 VIEW  Result Date: 09/04/2019 CLINICAL DATA:  Check support apparatus position EXAM: PORTABLE CHEST 1 VIEW FINDINGS: Support devices including bilateral chest tubes, endotracheal tube, feeding tube, right PICC line and left central line remain in place, unchanged. Right jugular catheter unchanged. Severe diffuse bilateral airspace disease is unchanged. No effusions or pneumothorax. Cardiomegaly.  IMPRESSION: Stable support devices. No pneumothorax. Severe diffuse bilateral airspace disease and cardiomegaly, unchanged. Electronically Signed   By: Rolm Baptise M.D.   On: 09/04/2019 19:19     Medications:     Scheduled Medications: . sodium chloride   Intravenous Once  . acetaZOLAMIDE  250 mg Per Tube BID  . amiodarone  150 mg Intravenous Once  . vitamin C  500 mg Per Tube Daily  . chlorhexidine gluconate (MEDLINE KIT)  15 mL Mouth Rinse BID  . Chlorhexidine Gluconate Cloth  6 each Topical Daily  . cloNIDine  0.3 mg Per Tube BID  . diazepam  20 mg Per Tube Q6H  . feeding supplement (PRO-STAT SUGAR FREE 64)  60 mL Per Tube BID  . gabapentin  450 mg Per Tube Q8H  . HYDROmorphone  12 mg Per Tube Q4H  . insulin aspart  3-9 Units Subcutaneous Q4H  . insulin detemir  10 Units Subcutaneous BID  . ipratropium-albuterol  3 mL Nebulization Q4H  . mouth rinse  15 mL Mouth Rinse 10 times per day  . montelukast  10 mg Per Tube QHS  . oxymetazoline  1 spray Each Nare BID  . pantoprazole sodium  40 mg Per Tube QHS  . potassium chloride  40 mEq Per Tube Q4H  . propranolol  20 mg Per Tube TID  . QUEtiapine  50 mg Per Tube BID  . sodium chloride flush  10-40 mL Intracatheter Q12H  . sodium chloride flush  10-40 mL Intracatheter Q12H  . Thrombi-Pad  1 each Topical Once  . Thrombi-Pad  1 each Topical Once  . zinc sulfate  220 mg Per Tube Daily    Infusions: . sodium chloride    . sodium chloride Stopped (09/05/19 1900)  . sodium chloride 10 mL/hr at 09/06/19 0600  . albumin human Stopped (09/05/19 2104)  . amiodarone 30 mg/hr (09/06/19 0600)  .  ceFAZolin (ANCEF) IV 2 g (09/06/19 0602)  . dexmedetomidine (PRECEDEX) IV infusion 1.6 mcg/kg/hr (09/06/19 0814)  . dextrose    . feeding supplement (PIVOT 1.5 CAL) 1,000 mL (09/05/19 0240)  . furosemide (LASIX) infusion 11 mg/hr (09/06/19 0600)  . HYDROmorphone 20 mg/hr (09/06/19 0815)  . midazolam (VERSED) infusion 10 mg/hr (09/06/19 0600)   . norepinephrine Stopped (09/06/19 0523)  . propofol (DIPRIVAN) infusion Stopped (08/31/19 0910)  . vasopressin (PITRESSIN) infusion - *FOR SHOCK* Stopped (09/05/19 1736)    PRN Medications: Place/Maintain arterial line **AND** sodium chloride, sodium chloride, sodium chloride, acetaminophen (TYLENOL) oral liquid 160 mg/5 mL, acetaminophen, albumin human, dextrose, hydrALAZINE, HYDROmorphone, magnesium hydroxide, midazolam, ondansetron (ZOFRAN) IV, senna-docusate, sodium chloride, sodium chloride flush, sodium chloride flush   Assessment/Plan:    1. Acute hypoxic/hypercarbic respiratory failure due to COVID PNA:  Remained markedly hypoxic despite full support. Intubated 12/26. S/p bilateral CTs 12/26 for pneumothoraces.  TEE on 12/26 LVEF 70% RV ok. VV ECMO begun 12/26. Tracheostomy 12/29.  COVID ARDS at this point. ECMO circuit changed 1/8 & 1/23 - ECMO parameters as per ECMO note - RIJ venous drainage cannula added 1/20. Circuit changed 1/23 - Flows look good. - TVs improving now > 300 ml - Massively volume overload. Continue lasix gtt. Place unna boots - Epistaxis remain ongoing issue. Continue packing and ANcef - LDH 480 -> 412 -> 445 -> 432 -> 450 -> 481 -> 503 Watch closely - Continue heparin. Discussed dosing with PharmD personally. - wean sedation as tolerated.  - Continue chest PT - Getting recirc from RIJ cannula. Hold heparin. Will pull today - overall oxygenation improving. TVs improved. Hopefully can wean soon   2. Bilateral PTX: Due to barotrauma.  - Driving pressure minimized at < 15 Stable. No change  3. COVID PNA: management of COVID infection per CCM. CXR with bilateral multifocal PNA progressing to likely ARDS.  - s/p 10 days of remdesivir - 12/16, 12/2 s/p tociluzimab - 12/17 s/p convalescent plasma - Decadron at 57m/daily completed 1/11 - Completed cefepime/vanc - On ceftriaxone for nasal packing. D/w ID pharmacy -> will switch to Ancef ro better gram  positive coverage  4. ID: Empiric coverage with vancomycin/cefepime initially for possible secondary bacterial PNA, course completed.   - treated with cefepime and vanc for + cx with staph epidimidis, MSSA and serratia (sensitivities ok). - On cefazolin for nasal packing.   5. Thrombocytopenia: Slow decrease in platelets, likely low level hemolysis + critical illness.  - resolved  6. Anemia: Bleeding at trach site resolved but still with epistaxis - hgb 9.9  today - Transfuse hgb < 8  7. FEN - continue TFs via oral Cor-trak  8.Dilated R pupil - head CT 1/28 no acute  9. Hypernatremia - NA down to 149. Will need FE flushes  D/w with ECMO team (ECMO coordinator/specialists, CCM, TCTS and pharmD) at bedside on multidisciplinary rounds. Continue to await ARDS recovery. Details as above.   Length of Stay: 461 CRITICAL CARE Performed by: BGlori Bickers Total critical care time: 35 minutes  Critical care time was exclusive of separately billable procedures and treating other patients.  Critical care was necessary to treat or prevent imminent or life-threatening deterioration.  Critical care was time spent personally by me (independent of midlevel providers or residents) on the following activities: development of treatment plan with patient and/or surrogate as well as nursing, discussions with consultants, evaluation of patient's response to treatment, examination of patient, obtaining history from patient or surrogate, ordering and performing treatments and interventions, ordering and review of laboratory studies, ordering and review of radiographic studies, pulse oximetry and re-evaluation of patient's condition.    DGlori BickersMD 09/06/2019, 8:40 AM  Advanced Heart Failure Team Pager [phone](M-F; 7[place]  Please contact [facility] for night-coverage after hours (4p -7a ) and weekends on [email or web address]

## 2019-09-06 NOTE — Progress Notes (Signed)
Orthopedic Tech Progress Note Patient Details:  [name] 05-30-1976 [id number]  Ortho Devices Type of Ortho Device: Ace wrap, Unna boot Ortho Device/Splint Location: Bilateral unna boot Ortho Device/Splint Interventions: Application   Post Interventions Patient Tolerated: Well Instructions Provided: Care of device   [name] 09/06/2019, 4:22 PM

## 2019-09-06 NOTE — Progress Notes (Addendum)
ANTICOAGULATION CONSULT NOTE - Follow Up Consult  Pharmacy Consult for Heparin Indication: ECMO  Patient Measurements: Height: 5' 8"  (172.7 cm) Weight: (!) 328 lb 7.8 oz (149 kg) IBW/kg (Calculated) : 68.4 Heparin Dosing Weight: 104.5kg  Vital Signs: Temp: 97.5 F (36.4 C) (01/21 1600) Temp Source: Axillary (01/21 1600) Pulse Rate: 87 (01/21 1700)  Labs: Recent Labs    09/04/19 1617 09/04/19 1902 09/05/19 0402 09/05/19 0420 09/05/19 1612 09/05/19 1615 09/05/19 1900 09/05/19 1932 09/05/19 2125 09/05/19 2125 09/06/19 0405 09/06/19 0412 09/06/19 1130 09/06/19 1130 09/06/19 1250 09/06/19 1622 09/06/19 1629  HGB 8.0*   < > 8.1*   < > 8.1*   < >  --    < > 7.8*   < > 8.7*   < > 8.5*   < > 7.6*  --  8.8*  HCT 27.0*   < > 27.2*   < > 27.9*   < >  --    < > 26.1*   < > 29.4*   < > 25.0*  --  25.9*  --  26.0*  PLT 154   < > 166   < > 183  --   --    < > 152  --  181  --   --   --  130*  --   --   APTT 87*   < > 93*   < > 101*  --   --   --   --   --  106*  --   --   --   --  44*  --   HEPARINUNFRC 0.41   < >  --    < >  --   --  0.48  --   --   --  0.39  --   --   --   --  <0.10*  --   CREATININE 0.66  --  0.68  --   --   --   --   --   --   --  0.84  --   --   --   --   --   --    < > = values in this interval not displayed.    Assessment: 44yo male on VV ECMO on heparin infusion. He is noted with recent epistaxis with nasal packing.  Hep was help this am d/t nasal bleeding. CCM placed some TXA to try to help bleeding. Resumed at 2500 units/hr ~ 1600  Hep lvl ~ 45 min after was < 0.1 - mainly got just to ensure pt was supratherapeutic for any reason (was on 800 units/hr for a while prior to change)  Goal of Therapy:  Heparin level 0.3-0.5 units/ml   Plan:  Continue heparin 2500 units/hr Recheck at 2100  Barth Kirks, PharmD, BCPS, [place] Pharmacist [phone]  Please check AMION for all [place] numbers  09/06/2019 6:24  PM  ____________________________________________________ 9:05 PM   Recheck of hep lvl slightly early was 0.21  Discussed with RN - no issues bleeding currently and flows of circuit have been stable  Plan: Increase to 2550 units/hr heparin Recheck at 0500 with am labs - discussed with RN to draw early if patient begins to bleed

## 2019-09-06 NOTE — Progress Notes (Signed)
ECMO Flow goal changed to 3-4 lpm per Dr. Lynetta Mare following the removal of the IJ drain cannula.

## 2019-09-06 NOTE — Progress Notes (Signed)
ANTICOAGULATION CONSULT NOTE - Follow Up Consult  Pharmacy Consult for Heparin Indication: ECMO  Patient Measurements: Height: 5' 8"  (172.7 cm) Weight: (!) 328 lb 7.8 oz (149 kg) IBW/kg (Calculated) : 68.4 Heparin Dosing Weight: 104.5kg  Vital Signs: Temp: 97.5 F (36.4 C) (01/21 1600) Temp Source: Axillary (01/21 1600) Pulse Rate: 87 (01/21 1700)  Labs: Recent Labs    09/04/19 1617 09/04/19 1617 09/04/19 1902 09/05/19 0401 09/05/19 0402 09/05/19 0420 09/05/19 1612 09/05/19 1615 09/05/19 1900 09/05/19 1932 09/05/19 2125 09/05/19 2125 09/06/19 0405 09/06/19 0412  HGB 8.0*   < >   < >  --  8.1*   < > 8.1*   < >  --    < > 7.8*   < > 8.7* 9.2*  HCT 27.0*   < >   < >  --  27.2*   < > 27.9*   < >  --    < > 26.1*  --  29.4* 27.0*  PLT 154   < >  --   --  166   < > 183  --   --   --  152  --  181  --   APTT 87*   < >  --   --  93*  --  101*  --   --   --   --   --  106*  --   HEPARINUNFRC 0.41  --    < > 0.43  --   --   --   --  0.48  --   --   --  0.39  --   CREATININE 0.66  --   --   --  0.68  --   --   --   --   --   --   --  0.84  --    < > = values in this interval not displayed.    Assessment: 44yo male on VV ECMO on heparin infusion. He is noted with recent epistaxis with nasal packing. -Heparin level therapeutic at 0.39. RN reports some dark red bleeding from nose yesterday. Pt to go to OR today for SVC drain removal.  Goal of Therapy:  Heparin level 0.3-0.5 units/ml   Plan:  Hold heparin infusion until post-op today and f/u resuming Monitor heparin level and CBC 5AM/5PM, daily LDH Monitor for any increase in bleeding from mouth/nose  Berenice Bouton, PharmD PGY1 Pharmacy Resident  Please check AMION for all [place] phone numbers After 10:00 PM, call [place] [phone]  09/06/2019  8:43 AM

## 2019-09-06 NOTE — Progress Notes (Signed)
Nutrition Follow up  DOCUMENTATION CODES:   Obesity unspecified  INTERVENTION:   Continue tube feeding:  -Pivot 1.5 @ 75 ml/hr via oral Cortrak (1800 ml)  -60 ml Prostat BID -Free water flushes- d/c by CCM  At goal rate TF provides: 3100 kcals, 229 grams protein, 1350 ml free water. Meets 100% of needs.   NUTRITION DIAGNOSIS:   Increased nutrient needs related to acute illness(COVID PNA) as evidenced by estimated needs.  Ongoing  GOAL:   Patient will meet greater than or equal to 90% of their needs   Addressed via TF  MONITOR:   Diet advancement, Skin, TF tolerance, Weight trends, Labs, I & O's  REASON FOR ASSESSMENT:   Consult Enteral/tube feeding initiation and management  ASSESSMENT:   Patient with PMH significant for GERD, HTN, and asthma. Presents this admission with COVID-19 PNA.   12/14- admit 12/23- chest xray revealed pneumomediastinum  12/24- developed bilateral pneumothoraces  12/26- worsening hypoxemia, intubated  12/27- s/p VV fem/fem ECMO cannulation  12/29- s/p trach  1/5- nasal bleeding- packing per CCM 1/7- nasal packing per ENT 1/8- trach exchanged, ECMO circuit changed  1/20- s/p additional RIJ drainage cannula  1/21- Cortrak kinked, replaced by OGT, pt vomited over night 1/22- ECMO circuit exchanged  1/23- nasal endoscopy, control of epistaxis   RD working remotely.  VV ECMO day 34. Oxygenation continues to improve. SVC cannula removed today at bedside. Hopefully to start weaning soon. Lasix increased. Na trending up-consider addition of free water. Tolerating TF at goal.   On initial oral Cortrak placement, cm marking read 92 cm at the lip. Per RN, Cortrak now reading at 51 cm marking. Per chest xray, tube at least enters stomach. If tube continues to move recommend obtaining KUB.   Admission weight: 136.1 kg  Current weight: 144.1 kg (up 4 kg from yesterday)  Patient requiring ventilator support via trach  MV: 4.0 L/min Temp  (24hrs), Avg:97.6 F (36.4 C), Min:97.2 F (36.2 C), Max:97.9 F (36.6 C)   I/O: +9,246 ml since 1/15 UOP: 4,650 ml x 24 hrs Stool: 925 ml x 24 hrs  Chest tubes: 60 ml x 24 hrs   Drips: albumin, precedex, 250 mg lasix in D5 @ 8 ml/hr, versed, levophed Medications: 500 mg Vitamin C daily, SS novolog, levemir, 220 mg zinc sulfate  Labs: Na 150 (H) CBG 136-231  Diet Order:   Diet Order            Diet NPO time specified  Diet effective midnight              EDUCATION NEEDS:   Not appropriate for education at this time  Skin:  Skin Assessment: Skin Integrity Issues: Skin Integrity Issues:: Other (Comment), Incisions Stage II: n/a Incisions: L wrist, nose Other: L arm blister, MASD-anus, groin  Last BM:  1/29- via rectal tube  Height:   Ht Readings from Last 1 Encounters:  08/09/19 5' 8"  (1.727 m)    Weight:   Wt Readings from Last 1 Encounters:  09/06/19 (!) 144.1 kg    Ideal Body Weight:  70 kg  BMI:  Body mass index is 48.3 kg/m.  Estimated Nutritional Needs:   Kcal:  9024-0973 kcal (25-30 kcal/kg)  Protein:  180-215 grams  Fluid:  >/= 2.5 L/day   [name] RD, LDN Clinical Nutrition Pager # - [phone]

## 2019-09-06 NOTE — Progress Notes (Addendum)
While titrating down sedation gently, pt began getting tachypnic, tachycardic and hypertensive, belly breathing, and drop in sats to 83%. Ecmo flows increased as venous pressures allowed on cardiohelp circuit.   Scheduled medications given without effectiveness. PRN boluses of sedation given, still without relief. Gtts titrated up now in attempt to improve oxygenation. See MAR for administration documentation. Sats eventually rose to 89-91%.   At 0200, pt vent alarming high peak pressures, suctioned patient without resolve. RT called and at bedside to assess. BBS wheezes. Scheduled neb given, and CPT performed. RT changing nebs to q4. After treatment, sats up to 94%. Pt still overbreathing vent, RR 22, VT 350s. RN and RT unable to obtain plateau pressures at this time. RT placed pt on 100% FiO2.

## 2019-09-06 NOTE — Progress Notes (Signed)
NAME:  [name], MRN:  [id number], DOB:  11/05/1975, LOS: 48 ADMISSION DATE:  07/22/2019, CONSULTATION DATE:  12/24 REFERRING MD:  Sloan Leiter, CHIEF COMPLAINT:  Dyspnea   Brief History   44 y/o M admitted 12/14 with COVID pneumonia causing acute hypoxemic respiratory failure. Developed pneumomediastinum 12/23 with concern for possible bilateral small pneumothoraces on 12/24.  PCCM consulted for evaluation of pneumothoraces  Past Medical History  GERD HTN Asthma  Significant Hospital Events   12/14 admit with hypoxemic respiratory failure in setting of COVID-19 pneumonia 12/23 noted to have pneumomediastinum on chest x-ray 12/24 PCCM consulted for evaluation 12/26 worsening hypoxemia, intubated  12/27 VV fem/fem ECMO cannulation See bedside nursing event notes for full rundown of procedures after 12/27   Consults:  PCCM, CTS, CHF, Palliative  Procedures:  See bedside nursing event notes for full rundown of procedures.  Significant Diagnostic Tests:  CT chest 12/22 >> extensive pneumomediastinum, multifocal patchy bilateral groundglass opacities CXR 1/25 >> improving bilateral infiltrates. ECMO cannulae in place.  Micro Data:  BCx2 12/14 >> negative  Sputum 12/15 >> negative  1/9 bronch: serratia and MSSA 1/2 acid fast smear bronch: negative 1/2 fungal cx bronch: pending 1/12 BAL: serratia 1/18 BAL >> rare Serratia.  Antimicrobials:  Received remdesivir, steroids, actemra and convalescent plasma  5 days cefepime 12/24-12/29 Cefepime 1/4->1/14 vanc 1/4->1/14 Ceftriaxone 1/14 >>   Interim history/subjective:  Improving oxygenation. Continues to have substantial nasal bleeding. Decreased sedation this morning. Increasing recirculation.    Objective   Blood pressure (!) 144/48, pulse 96, temperature (!) 97.3 F (36.3 C), temperature source Other (Comment), resp. rate (!) 23, height _0  (1.727 m), weight (!) 144.1 kg, SpO2 95 %. CVP:  [4 mmHg-11 mmHg] 7 mmHg   Vent Mode: PCV FiO2 (%):  [60 %-80 %] 80 % Set Rate:  [15 bmp] 15 bmp PEEP:  [15 cmH20] 15 cmH20 Plateau Pressure:  [26 cmH20-30 cmH20] 26 cmH20   Intake/Output Summary (Last 24 hours) at 09/06/2019 0949 Last data filed at 09/06/2019 0600 Gross per 24 hour  Intake 6787.63 ml  Output 4935 ml  Net 1852.63 ml   Filed Weights   09/03/19 0600 09/05/19 0440 09/06/19 0500  Weight: (!) 145.9 kg (!) 140.9 kg (!) 144.1 kg   Examination GEN: obese ill man on vent HEENT: trach in place, bleeding from the left nostril predominantly. Packing in place.  Bleeding largely stopped. CV: RRR, ext warm PULM: improving air movement with anterior vesicular breath sounds, bronchial to anterior axillary line. More active belly breathing. GI: Soft, + BS EXT: Global anascarca has improved significantly - mild statis dermatitis of LLE. Appears more edematous compared to 3 days ago NEURO: sedated, NMB off. Opens eyes and blinks, pupils are reactive. Occasionally orients to voice PSYCH: RASS -3 SKIN: pale Femoral cannulas in place, sites looks okay Bilateral chest tubes in place, no tidaling or air leak, small amount. ongoing serosanguinous output Foley in place dark yellow urine Rectal tube in place with tan loose stool   CXR increased consolidation of basilar lung fields   Now better able to see the diaphragms. Continued dense consolidation of left base.  CT head: pansinusitis.  CT chest: Dense bilateral consolidation in dependent lungs both sides  CT abdomen: Cannulae in position in IVC.  Flow (LPM): 4.48 ECMO Mode: VV ECMO Device: Cardiohelp  $ Ventilator Initial/Subsequent : Subsequent, Vent Mode: PCV, Set Rate: 15 bmp, FiO2 (%): 80 %, I Time: 0.75 Sec(s), PEEP: 15 cmH20 Vt today 350  ml  Net IO Since Admission: 35,437.51 mL [09/06/19 0949]  Resolved Hospital Problem list   MSSA and serratia marcescens pneumonia   Assessment & Plan:    Acute hypoxemic respiratory failure with ARDS  secondary to COVID-19, s/p tracheostomy requiring mechanical ventilation and VV ECMO support.  COVID has been treated with all evidence based therapies, we are dealing with lingering severe diffuse alveolar damage. Improvement in aeration of both lungs. Recirculation likely due to migration of return cannula. Will remove SVC cannula today.  Continue to wean sedation and allow patient participation. Resedate only if gas exchange is significantly impaired.  Posterior epistaxis. Repacked multiple times  Cortrack removed and epistaxis appears to have resolved. Left nostril repacked with thrombi-pad. Petrolatum jelly to right to keep moist. Will try topical TXA Keep off heparin following SVC cannula removal until epistaxis stops.  Daily Goals Checklist  Pain/Anxiety/Delirium protocol (if indicated): Increase oral regimen. Wean Versed off. Continue with Dilaudid and Precedex.  Restart low dose quetiapine. VAP protocol (if indicated): Bundle in place. Respiratory support goals: Continue PCV with ultra-lung protective ventilation, generous PEEP.  PEEP currently optimized. Continue to oversleep as may help with air hunger during sedation wean. Blood pressure target:  MAP>65, off pressors ECMO SVC cannula pulled back 2cm  DVT prophylaxis: on systemic anticoagulation on VV-ECMO - extended hold for cannula removal. Nutrition Status: Nutrition Problem: Increased nutrient needs Etiology: acute illness(COVID PNA) Signs/Symptoms: estimated needs Interventions: Tube feeding, Prostat GI prophylaxis: Protonix per tube. Fluid status goals: continue  furosemide infusion. Keep 1.5-2.0L/day negative. Urinary catheter: Guide hemodynamic management Glucose control: euglycemic on SSI. Mobility/therapy needs: Bedrest.  Daily labs: as per ECMO protocol. Code Status: Full code  Family Communication:family updated by RN. Disposition: ICU   Labs   CBC: CBC Latest Ref Rng & Units 09/06/2019 09/06/2019 09/06/2019   WBC 4.0 - 10.5 K/uL - - 14.1(H)  Hemoglobin 13.0 - 17.0 g/dL 9.9(L) 9.2(L) 8.7(L)  Hematocrit 39.0 - 52.0 % 29.0(L) 27.0(L) 29.4(L)  Platelets 150 - 400 K/uL - - 147    Basic Metabolic Panel: BMP Latest Ref Rng & Units 09/06/2019 09/06/2019 09/06/2019  Glucose 70 - 99 mg/dL - - 214(H)  BUN 6 - 20 mg/dL - - 71(H)  Creatinine 0.61 - 1.24 mg/dL - - 0.84  Sodium 135 - 145 mmol/L 149(H) 150(H) 149(H)  Potassium 3.5 - 5.1 mmol/L 3.8 3.4(L) 3.3(L)  Chloride 98 - 111 mmol/L - - 104  CO2 22 - 32 mmol/L - - 30  Calcium 8.9 - 10.3 mg/dL - - 9.2    GFR: Estimated Creatinine Clearance: 155 mL/min (by C-G formula based on SCr of 0.84 mg/dL). Recent Labs  Lab 09/03/19 0448 09/03/19 1618 09/04/19 0416 09/04/19 1617 09/05/19 0401 09/05/19 0402 09/05/19 1612 09/05/19 2125 09/06/19 0405  WBC 10.7*   < > 12.3*   < >  --  13.0* 13.6* 12.5* 14.1*  LATICACIDVEN 1.2  --  1.1  --  1.1  --   --   --  1.3   < > = values in this interval not displayed.    Liver Function Tests: Recent Labs  Lab 08/31/19 1150  AST 24  ALT 26  ALKPHOS 75  BILITOT 0.7  PROT 6.2*  ALBUMIN 2.2*   No results for input(s): LIPASE, AMYLASE in the last 168 hours. No results for input(s): AMMONIA in the last 168 hours.  ABG    Component Value Date/Time   PHART 7.372 09/06/2019 0810   PCO2ART 56.8 (H) 09/06/2019 0810  PO2ART 62.0 (L) 09/06/2019 0810   HCO3 33.1 (H) 09/06/2019 0810   TCO2 35 (H) 09/06/2019 0810   ACIDBASEDEF 2.0 08/28/2019 1519   O2SAT 91.0 09/06/2019 0810     Coagulation Profile: No results for input(s): INR, PROTIME in the last 168 hours.  Cardiac Enzymes: No results for input(s): CKTOTAL, CKMB, CKMBINDEX, TROPONINI in the last 168 hours.  HbA1C: Hgb A1c MFr Bld  Date/Time Value Ref Range Status  07/24/2019 04:50 AM 6.7 (H) 4.8 - 5.6 % Final    Comment:    (NOTE) Pre diabetes:          5.7%-6.4% Diabetes:              >6.4% Glycemic control for   <7.0% adults with diabetes    09/30/2017 05:36 AM 5.7 (H) 4.8 - 5.6 % Final    Comment:    (NOTE)         Prediabetes: 5.7 - 6.4         Diabetes: >6.4         Glycemic control for adults with diabetes: <7.0     CBG: Recent Labs  Lab 09/05/19 1612 09/05/19 1933 09/06/19 0020 09/06/19 0420 09/06/19 0754  GLUCAP 240* 193* 203* 197* 165*    CRITICAL CARE Performed by: Kipp Brood   Total critical care time: 50 minutes  Critical care time was exclusive of separately billable procedures and treating other patients.  Critical care was necessary to treat or prevent imminent or life-threatening deterioration.  Critical care was time spent personally by me on the following activities: development of treatment plan with patient and/or surrogate as well as nursing, discussions with consultants, evaluation of patient's response to treatment, examination of patient, obtaining history from patient or surrogate, ordering and performing treatments and interventions, ordering and review of laboratory studies, ordering and review of radiographic studies, pulse oximetry, re-evaluation of patient's condition and participation in multidisciplinary rounds.  Kipp Brood, MD [facility] ICU Physician [place]  Pager: [phone] Mobile: [phone] After hours: [phone].     09/06/2019, 9:49 AM

## 2019-09-06 NOTE — Progress Notes (Signed)
Discussed ECMO Flow goals with Dr. Lynetta Mare, currently 3-4 lpm.  Dr. Lynetta Mare stated to increase flows as patient is able to tolerate.

## 2019-09-06 NOTE — Procedures (Signed)
The SVC venous cannula is cleansed and draped sterilely. It is removed with manual control of bleeding followed by suture ligation placement. Tolerated well. No evidence of bleeding.  Ok to restart heparin in one hour.  [place] Droll [name], [place]

## 2019-09-06 NOTE — Plan of Care (Signed)
  Problem: Respiratory: Goal: Will maintain a patent airway Outcome: Progressing   Problem: Respiratory: Goal: Ability to maintain a clear airway and adequate ventilation will improve Outcome: Progressing   Problem: Clinical Measurements: Goal: Ability to maintain clinical measurements within normal limits will improve Outcome: Progressing Goal: Respiratory complications will improve Outcome: Progressing   Problem: Nutrition: Goal: Adequate nutrition will be maintained Outcome: Progressing   Problem: Coping: Goal: Level of anxiety will decrease Outcome: Progressing   Problem: Elimination: Goal: Will not experience complications related to urinary retention Outcome: Progressing   Problem: Pain Managment: Goal: General experience of comfort will improve Outcome: Progressing   Problem: Safety: Goal: Ability to remain free from injury will improve Outcome: Progressing   Problem: Skin Integrity: Goal: Risk for impaired skin integrity will decrease Outcome: Progressing

## 2019-09-07 ENCOUNTER — Inpatient Hospital Stay (HOSPITAL_COMMUNITY): Payer: Managed Care, Other (non HMO)

## 2019-09-07 DIAGNOSIS — R0603 Acute respiratory distress: Secondary | ICD-10-CM

## 2019-09-07 LAB — COOXEMETRY PANEL
Carboxyhemoglobin: 3.1 % — ABNORMAL HIGH (ref 0.5–1.5)
Methemoglobin: 1 % (ref 0.0–1.5)
O2 Saturation: 68.1 %
Total hemoglobin: 11.8 g/dL — ABNORMAL LOW (ref 12.0–16.0)

## 2019-09-07 LAB — URINALYSIS, ROUTINE W REFLEX MICROSCOPIC
Bilirubin Urine: NEGATIVE
Glucose, UA: NEGATIVE mg/dL
Hgb urine dipstick: NEGATIVE
Ketones, ur: NEGATIVE mg/dL
Leukocytes,Ua: NEGATIVE
Nitrite: NEGATIVE
Protein, ur: 30 mg/dL — AB
Specific Gravity, Urine: 1.021 (ref 1.005–1.030)
pH: 5 (ref 5.0–8.0)

## 2019-09-07 LAB — CBC
HCT: 30.3 % — ABNORMAL LOW (ref 39.0–52.0)
HCT: 27.7 % — ABNORMAL LOW (ref 39.0–52.0)
HCT: 29 % — ABNORMAL LOW (ref 39.0–52.0)
Hemoglobin: 8.9 g/dL — ABNORMAL LOW (ref 13.0–17.0)
Hemoglobin: 8.2 g/dL — ABNORMAL LOW (ref 13.0–17.0)
Hemoglobin: 8.6 g/dL — ABNORMAL LOW (ref 13.0–17.0)
MCH: 28.3 pg (ref 26.0–34.0)
MCH: 28.4 pg (ref 26.0–34.0)
MCH: 28.1 pg (ref 26.0–34.0)
MCHC: 29.4 g/dL — ABNORMAL LOW (ref 30.0–36.0)
MCHC: 29.6 g/dL — ABNORMAL LOW (ref 30.0–36.0)
MCHC: 29.7 g/dL — ABNORMAL LOW (ref 30.0–36.0)
MCV: 96.5 fL (ref 80.0–100.0)
MCV: 95.8 fL (ref 80.0–100.0)
MCV: 94.8 fL (ref 80.0–100.0)
Platelets: 141 10*3/uL — ABNORMAL LOW (ref 150–400)
Platelets: 122 10*3/uL — ABNORMAL LOW (ref 150–400)
Platelets: 129 10*3/uL — ABNORMAL LOW (ref 150–400)
RBC: 3.14 MIL/uL — ABNORMAL LOW (ref 4.22–5.81)
RBC: 2.89 MIL/uL — ABNORMAL LOW (ref 4.22–5.81)
RBC: 3.06 MIL/uL — ABNORMAL LOW (ref 4.22–5.81)
RDW: 19 % — ABNORMAL HIGH (ref 11.5–15.5)
RDW: 18.9 % — ABNORMAL HIGH (ref 11.5–15.5)
RDW: 18.6 % — ABNORMAL HIGH (ref 11.5–15.5)
WBC: 12.4 10*3/uL — ABNORMAL HIGH (ref 4.0–10.5)
WBC: 10.1 10*3/uL (ref 4.0–10.5)
WBC: 10.1 10*3/uL (ref 4.0–10.5)
nRBC: 3.3 % — ABNORMAL HIGH (ref 0.0–0.2)
nRBC: 4.4 % — ABNORMAL HIGH (ref 0.0–0.2)
nRBC: 5.6 % — ABNORMAL HIGH (ref 0.0–0.2)

## 2019-09-07 LAB — POCT I-STAT 7, (LYTES, BLD GAS, ICA,H+H)
Acid-Base Excess: 6 mmol/L — ABNORMAL HIGH (ref 0.0–2.0)
Acid-Base Excess: 6 mmol/L — ABNORMAL HIGH (ref 0.0–2.0)
Acid-Base Excess: 3 mmol/L — ABNORMAL HIGH (ref 0.0–2.0)
Acid-Base Excess: 3 mmol/L — ABNORMAL HIGH (ref 0.0–2.0)
Bicarbonate: 33 mmol/L — ABNORMAL HIGH (ref 20.0–28.0)
Bicarbonate: 32.5 mmol/L — ABNORMAL HIGH (ref 20.0–28.0)
Bicarbonate: 30 mmol/L — ABNORMAL HIGH (ref 20.0–28.0)
Bicarbonate: 29.3 mmol/L — ABNORMAL HIGH (ref 20.0–28.0)
Calcium, Ion: 1.29 mmol/L (ref 1.15–1.40)
Calcium, Ion: 1.28 mmol/L (ref 1.15–1.40)
Calcium, Ion: 1.28 mmol/L (ref 1.15–1.40)
Calcium, Ion: 1.27 mmol/L (ref 1.15–1.40)
HCT: 28 % — ABNORMAL LOW (ref 39.0–52.0)
HCT: 29 % — ABNORMAL LOW (ref 39.0–52.0)
HCT: 29 % — ABNORMAL LOW (ref 39.0–52.0)
HCT: 28 % — ABNORMAL LOW (ref 39.0–52.0)
Hemoglobin: 9.5 g/dL — ABNORMAL LOW (ref 13.0–17.0)
Hemoglobin: 9.9 g/dL — ABNORMAL LOW (ref 13.0–17.0)
Hemoglobin: 9.9 g/dL — ABNORMAL LOW (ref 13.0–17.0)
Hemoglobin: 9.5 g/dL — ABNORMAL LOW (ref 13.0–17.0)
O2 Saturation: 93 %
O2 Saturation: 94 %
O2 Saturation: 87 %
O2 Saturation: 88 %
Patient temperature: 36.8
Patient temperature: 36.8
Patient temperature: 36.5
Patient temperature: 36.6
Potassium: 4.1 mmol/L (ref 3.5–5.1)
Potassium: 4.1 mmol/L (ref 3.5–5.1)
Potassium: 3.6 mmol/L (ref 3.5–5.1)
Potassium: 3.9 mmol/L (ref 3.5–5.1)
Sodium: 151 mmol/L — ABNORMAL HIGH (ref 135–145)
Sodium: 151 mmol/L — ABNORMAL HIGH (ref 135–145)
Sodium: 150 mmol/L — ABNORMAL HIGH (ref 135–145)
Sodium: 151 mmol/L — ABNORMAL HIGH (ref 135–145)
TCO2: 35 mmol/L — ABNORMAL HIGH (ref 22–32)
TCO2: 34 mmol/L — ABNORMAL HIGH (ref 22–32)
TCO2: 32 mmol/L (ref 22–32)
TCO2: 31 mmol/L (ref 22–32)
pCO2 arterial: 61.2 mmHg — ABNORMAL HIGH (ref 32.0–48.0)
pCO2 arterial: 57.1 mmHg — ABNORMAL HIGH (ref 32.0–48.0)
pCO2 arterial: 53.8 mmHg — ABNORMAL HIGH (ref 32.0–48.0)
pCO2 arterial: 53.1 mmHg — ABNORMAL HIGH (ref 32.0–48.0)
pH, Arterial: 7.338 — ABNORMAL LOW (ref 7.350–7.450)
pH, Arterial: 7.362 (ref 7.350–7.450)
pH, Arterial: 7.351 (ref 7.350–7.450)
pH, Arterial: 7.347 — ABNORMAL LOW (ref 7.350–7.450)
pO2, Arterial: 72 mmHg — ABNORMAL LOW (ref 83.0–108.0)
pO2, Arterial: 77 mmHg — ABNORMAL LOW (ref 83.0–108.0)
pO2, Arterial: 56 mmHg — ABNORMAL LOW (ref 83.0–108.0)
pO2, Arterial: 57 mmHg — ABNORMAL LOW (ref 83.0–108.0)

## 2019-09-07 LAB — GLUCOSE, CAPILLARY
Glucose-Capillary: 172 mg/dL — ABNORMAL HIGH (ref 70–99)
Glucose-Capillary: 162 mg/dL — ABNORMAL HIGH (ref 70–99)
Glucose-Capillary: 225 mg/dL — ABNORMAL HIGH (ref 70–99)
Glucose-Capillary: 233 mg/dL — ABNORMAL HIGH (ref 70–99)
Glucose-Capillary: 201 mg/dL — ABNORMAL HIGH (ref 70–99)
Glucose-Capillary: 180 mg/dL — ABNORMAL HIGH (ref 70–99)
Glucose-Capillary: 209 mg/dL — ABNORMAL HIGH (ref 70–99)
Glucose-Capillary: 183 mg/dL — ABNORMAL HIGH (ref 70–99)

## 2019-09-07 LAB — BASIC METABOLIC PANEL
Anion gap: 14 (ref 5–15)
BUN: 81 mg/dL — ABNORMAL HIGH (ref 6–20)
CO2: 30 mmol/L (ref 22–32)
Calcium: 9.4 mg/dL (ref 8.9–10.3)
Chloride: 107 mmol/L (ref 98–111)
Creatinine, Ser: 0.99 mg/dL (ref 0.61–1.24)
GFR calc Af Amer: >60 mL/min (ref >60)
GFR calc non Af Amer: >60 mL/min (ref >60)
Glucose, Bld: 163 mg/dL — ABNORMAL HIGH (ref 70–99)
Potassium: 4.2 mmol/L (ref 3.5–5.1)
Sodium: 151 mmol/L — ABNORMAL HIGH (ref 135–145)

## 2019-09-07 LAB — LACTIC ACID, PLASMA: Lactic Acid, Venous: 1.2 mmol/L (ref 0.5–1.9)

## 2019-09-07 LAB — APTT
aPTT: 90 seconds — ABNORMAL HIGH (ref 24–36)
aPTT: 118 seconds — ABNORMAL HIGH (ref 24–36)

## 2019-09-07 LAB — PREPARE RBC (CROSSMATCH)

## 2019-09-07 LAB — HEPARIN LEVEL (UNFRACTIONATED)
Heparin Unfractionated: 0.32 IU/mL (ref 0.30–0.70)
Heparin Unfractionated: 0.39 [IU]/mL (ref 0.30–0.70)

## 2019-09-07 LAB — LACTATE DEHYDROGENASE: LDH: 583 U/L — ABNORMAL HIGH (ref 98–192)

## 2019-09-07 LAB — MAGNESIUM: Magnesium: 2.8 mg/dL — ABNORMAL HIGH (ref 1.7–2.4)

## 2019-09-07 MED ORDER — INSULIN REGULAR(HUMAN) IN NACL 100-0.9 UT/100ML-% IV SOLN
INTRAVENOUS | Status: DC
  Administered 2019-09-07: 3.8 [IU]/h via INTRAVENOUS
  Filled 2019-09-07: qty 100

## 2019-09-07 MED ORDER — IPRATROPIUM-ALBUTEROL 0.5-2.5 (3) MG/3ML IN SOLN
3.0000 mL | RESPIRATORY_TRACT | Status: DC
  Administered 2019-09-07 – 2019-10-11 (×204): 3 mL via RESPIRATORY_TRACT
  Filled 2019-09-07 (×193): qty 3

## 2019-09-07 MED ORDER — SODIUM CHLORIDE 0.9% IV SOLUTION: Freq: Once | INTRAVENOUS | Status: AC

## 2019-09-07 MED ORDER — AMIODARONE LOAD VIA INFUSION
150.0000 mg | Freq: Once | INTRAVENOUS | Status: AC
  Administered 2019-09-07: 150 mg via INTRAVENOUS
  Filled 2019-09-07: qty 83.34

## 2019-09-07 MED ORDER — SODIUM CHLORIDE 0.9 % IV SOLN: 1.0000 mg/h | INTRAVENOUS | Status: DC

## 2019-09-07 MED ORDER — MIDAZOLAM 50MG/50ML (1MG/ML) PREMIX INFUSION
1.0000 mg/h | INTRAVENOUS | Status: DC
  Administered 2019-09-07: 5 mg/h via INTRAVENOUS
  Administered 2019-09-07: 6 mg/h via INTRAVENOUS
  Administered 2019-09-08: 4 mg/h via INTRAVENOUS
  Administered 2019-09-09: 2 mg/h via INTRAVENOUS
  Administered 2019-09-11: 14 mg/h via INTRAVENOUS
  Administered 2019-09-11: 02:00:00 4 mg/h via INTRAVENOUS
  Administered 2019-09-11 (×2): 10 mg/h via INTRAVENOUS
  Administered 2019-09-11: 17 mg/h via INTRAVENOUS
  Administered 2019-09-11 (×2): 14 mg/h via INTRAVENOUS
  Administered 2019-09-12: 10:00:00 2 mg/h via INTRAVENOUS
  Administered 2019-09-12: 8 mg/h via INTRAVENOUS
  Administered 2019-09-15 (×2): 1 mg/h via INTRAVENOUS
  Filled 2019-09-07 (×2): qty 100
  Filled 2019-09-07 (×4): qty 50
  Filled 2019-09-07: qty 100
  Filled 2019-09-07 (×6): qty 50

## 2019-09-07 MED ORDER — FREE WATER
200.0000 mL | Freq: Three times a day (TID) | Status: DC
  Administered 2019-09-07 – 2019-09-08 (×4): 200 mL

## 2019-09-07 MED ORDER — DEXTROSE 50 % IV SOLN: 0.0000 mL | INTRAVENOUS | Status: DC | PRN

## 2019-09-07 MED ORDER — FUROSEMIDE 10 MG/ML IJ SOLN
8.0000 mg/h | INTRAVENOUS | Status: DC
  Administered 2019-09-07 – 2019-09-08 (×2): 4 mg/h via INTRAVENOUS
  Administered 2019-09-09 (×2): 10 mg/h via INTRAVENOUS
  Administered 2019-09-10: 8 mg/h via INTRAVENOUS
  Filled 2019-09-07: qty 20
  Filled 2019-09-07: qty 21
  Filled 2019-09-07 (×2): qty 25

## 2019-09-07 MED ORDER — POTASSIUM CHLORIDE 20 MEQ/15ML (10%) PO SOLN
40.0000 meq | Freq: Once | ORAL | Status: AC
  Administered 2019-09-07: 40 meq
  Filled 2019-09-07: qty 30

## 2019-09-07 NOTE — Progress Notes (Signed)
ANTICOAGULATION CONSULT NOTE - Follow Up Consult  Pharmacy Consult for Heparin Indication: ECMO  Patient Measurements: Height: 5' 8"  (172.7 cm) Weight: (!) 328 lb 7.8 oz (149 kg) IBW/kg (Calculated) : 68.4 Heparin Dosing Weight: 104.5kg  Vital Signs: Temp: 97.5 F (36.4 C) (01/21 1600) Temp Source: Axillary (01/21 1600) Pulse Rate: 87 (01/21 1700)  Labs: Recent Labs    09/06/19 0405 09/06/19 0412 09/06/19 1622 09/06/19 1622 09/06/19 1629 09/06/19 1911 09/06/19 1950 09/06/19 1955 09/07/19 0433 09/07/19 0443 09/07/19 1111 09/07/19 1111 09/07/19 1545 09/07/19 1545 09/07/19 1554 09/07/19 1959  HGB 8.7*   < >  --   --    < >  --   --    < > 8.9*   < > 8.2*   < > 8.6*   < > 9.9* 9.5*  HCT 29.4*   < >  --   --    < >  --   --    < > 30.3*   < > 27.7*   < > 29.0*  --  29.0* 28.0*  PLT 181   < >  --   --    < >  --   --    < > 141*  --  122*  --  129*  --   --   --   APTT 106*   < > 44*  --   --   --   --   --  90*  --   --   --  118*  --   --   --   HEPARINUNFRC 0.39   < > <0.10*   < >  --   --  0.21*  --  0.32  --   --   --  0.39  --   --   --   CREATININE 0.84  --   --   --   --  0.94  --   --  0.99  --   --   --   --   --   --   --    < > = values in this interval not displayed.    Assessment: 44yo male on VV ECMO on heparin infusion.  -Heparin level therapeutic at 0.39 on heparin drip 2550 units/hr. Nosebleed appears stable - packing removed 1/29 s/p TXA spray applied.  H/H stable tonight post PRBC earlier   LDH trending up - watch  Goal of Therapy:  Heparin level 0.3-0.5 units/ml   Plan:  Continue IV heparin at 2550 units/hr. Monitor heparin level and CBC 5AM/5PM, daily LDH Monitor for any increase in bleeding from mouth/nose    Bonnita Nasuti Pharm.D. CPP, BCPS Clinical Pharmacist [phone] 09/07/2019 8:46 PM

## 2019-09-07 NOTE — Progress Notes (Signed)
Recommended D/c of flexi seal is 21 days. That will fall on 2/6. Took water out of balloon for 30 mins to relieve pressure on rectum then reinstated.. Will contact Rep and discuss management of chronic diarrhea in critically ill patients. No bleeding from rectum noted.

## 2019-09-07 NOTE — Progress Notes (Signed)
Patient ID: [name], male   DOB: 1976-06-06, 43 y.o.   MRN: [id number]    Advanced Heart Failure Rounding Note   Subjective:    Circuit changed and trach exchanged on 1/8.   Underwent addition of a RIJ drainage cannula on 1/20 Circuit changed on 1/22 for rising LDH Had evidence of recirculation and RIJ cannula pulled back on 1/26, removed on 1/29.   Remains on VV ecmo with good flows. Oxygen saturation 97% this morning, Vt improved to 360.   I/Os ++ with Lasix gtt 8 mg/hr, stable BUN/creatinine, weight appears stable.   ABG 7.34/61/72/93%  Co-ox 68%. Sweep increased to 15 with CO2 creeping up.   CXR: Diffuse bilateral lung opacities - mildly improved. Personally reviewed  On vent now at 100% PEEP 15. PlatP 30, Bi-vent setting.TVs ~360 cc Personally reviewed  hgb 9.9 LDH 480 -> 445 -> 432-> 450 -> 481 -> 503 -> 583.  Lactate 1.2.   ECMO parameters Flow 3.9 L/min RPM 3750 deltaP 33 Pressure -108  Objective:   Weight Range:  Vital Signs:   Temp:  [97.2 F (36.2 C)-97.9 F (36.6 C)] 97.9 F (36.6 C) (01/30 0000) Pulse Rate:  [67-111] 111 (01/30 0600) Resp:  [14-34] 22 (01/30 0600) BP: (112-164)/(44-58) 150/52 (01/30 0400) SpO2:  [85 %-97 %] 95 % (01/30 0600) Arterial Line BP: (72-166)/(33-63) 156/57 (01/30 0600) FiO2 (%):  [80 %-100 %] 80 % (01/30 0400) Weight:  [143.9 kg] 143.9 kg (01/30 0500) Last BM Date: 09/06/19  Weight change: Filed Weights   09/05/19 0440 09/06/19 0500 09/07/19 0500  Weight: (!) 140.9 kg (!) 144.1 kg (!) 143.9 kg    Intake/Output:   Intake/Output Summary (Last 24 hours) at 09/07/2019 0758 Last data filed at 09/07/2019 0626 Gross per 24 hour  Intake 6325.2 ml  Output 4105 ml  Net 2220.2 ml     Physical Exam: General: Sedated on vent.  Neck: Tracheostomy Lungs: Decreased bilaterally CV: Nondisplaced PMI.  Heart regular S1/S2, no S3/S4, no murmur.  1+ edema to knees Abdomen: Soft, nontender, no hepatosplenomegaly, mild  distention.  Skin: Intact without lesions or rashes.  Neurologic: Sedated Extremities: No clubbing or cyanosis.  HEENT: Normal.   Telemetry: Sinus tach 100-110s  Personally reviewed  Labs: Basic Metabolic Panel: Recent Labs  Lab 08/31/19 1150 08/31/19 1630 09/01/19 0414 09/01/19 0425 09/04/19 1617 09/04/19 1902 09/05/19 0402 09/05/19 0420 09/06/19 0405 09/06/19 0412 09/06/19 1629 09/06/19 1911 09/06/19 1955 09/07/19 0433 09/07/19 0443  NA 145   < > 145   < > 146*   < > 148*   < > 149*   < > 146* 150* 151* 151* 151*  K 3.6   < > 4.0   < > 3.3*   < > 3.8   < > 3.3*   < > 3.8 3.9 3.7 4.2 4.1  CL 104   < > 106   < > 105  --  106  --  104  --   --  110  --  107  --   CO2 29   < > 28   < > 30  --  30  --  30  --   --  28  --  30  --   GLUCOSE 191*   < > 177*   < > 186*  --  168*  --  214*  --   --  204*  --  163*  --   BUN 31*   < >  35*   < > 52*  --  57*  --  71*  --   --  79*  --  81*  --   CREATININE 0.65   < > 0.55*   < > 0.66  --  0.68  --  0.84  --   --  0.94  --  0.99  --   CALCIUM 8.6*   < > 9.0   < > 8.8*   < > 9.0   < > 9.2  --   --  9.1  --  9.4  --   MG 2.3  --  2.3  --   --   --   --   --   --   --   --   --   --  2.8*  --   PHOS  --   --  5.4*  --   --   --   --   --   --   --   --   --   --   --   --    < > = values in this interval not displayed.    Liver Function Tests: Recent Labs  Lab 08/31/19 1150  AST 24  ALT 26  ALKPHOS 75  BILITOT 0.7  PROT 6.2*  ALBUMIN 2.2*   No results for input(s): LIPASE, AMYLASE in the last 168 hours. No results for input(s): AMMONIA in the last 168 hours.  CBC: Recent Labs  Lab 09/05/19 2125 09/05/19 2125 09/06/19 0405 09/06/19 0412 09/06/19 1250 09/06/19 1250 09/06/19 1629 09/06/19 1855 09/06/19 1955 09/07/19 0433 09/07/19 0443  WBC 12.5*  --  14.1*  --  10.2  --   --  11.2*  --  12.4*  --   HGB 7.8*   < > 8.7*   < > 7.6*   < > 8.8* 8.7* 9.2* 8.9* 9.5*  HCT 26.1*   < > 29.4*   < > 25.9*   < > 26.0* 29.6*  27.0* 30.3* 28.0*  MCV 95.6  --  96.4  --  97.4  --   --  94.9  --  96.5  --   PLT 152  --  181  --  130*  --   --  136*  --  141*  --    < > = values in this interval not displayed.    Cardiac Enzymes: No results for input(s): CKTOTAL, CKMB, CKMBINDEX, TROPONINI in the last 168 hours.  BNP: BNP (last 3 results) Recent Labs    07/22/19 1039  BNP 15.3    ProBNP (last 3 results) No results for input(s): PROBNP in the last 8760 hours.    Other results:  Imaging: CT ABDOMEN PELVIS WO CONTRAST  Result Date: 09/05/2019 CLINICAL DATA:  Abdominal pain and distention. COVID-19 pneumonia. EXAM: CT CHEST, ABDOMEN AND PELVIS WITHOUT CONTRAST TECHNIQUE: Multidetector CT imaging of the chest, abdomen and pelvis was performed following the standard protocol without IV contrast. COMPARISON:  CT scan of the abdomen dated 04/13/2019 FINDINGS: CT CHEST FINDINGS Cardiovascular: Tracheostomy tube, feeding tube and 3 central venous catheters in place in the chest. Bilateral chest tubes. Heart size is normal. No pericardial effusion. Mediastinum/Nodes: No significant axillary, hilar or mediastinal adenopathy. Thyroid gland, trachea, and esophagus are normal. Lungs/Pleura: There is dense consolidative pneumonia throughout both lungs. There is very little remaining aerated lung. No effusions. No pneumothorax. Musculoskeletal: No chest wall mass or suspicious bone lesions identified. CT ABDOMEN PELVIS FINDINGS Hepatobiliary:  No focal liver abnormality is seen. No gallstones, gallbladder wall thickening, or biliary dilatation. Pancreas: Unremarkable. No pancreatic ductal dilatation or surrounding inflammatory changes. Spleen: Normal in size without focal abnormality. Adrenals/Urinary Tract: Adrenal glands and kidneys are normal. No hydronephrosis. Foley catheter in the bladder. Small amount of air in the bladder. Stomach/Bowel: Stomach is within normal limits. Appendix appears normal. No evidence of bowel wall  thickening, distention, or inflammatory changes. Drain in the rectum. Feeding tube in the fourth portion of the duodenum. Vascular/Lymphatic: Bilateral femoral vein catheters are in place with the tips in the upper inferior vena cava. No adenopathy. Reproductive: Prostate is unremarkable. Other: No ascites or free air. Musculoskeletal: Slight subcutaneous edema in the proximal left thigh and in the subcutaneous fat at the left lower quadrant of the abdomen of unknown etiology. No significant bone abnormality. IMPRESSION: 1. Dense consolidative pneumonia throughout both lungs. 2. No acute abnormality of the abdomen or pelvis. 3. Slight subcutaneous edema in the proximal left thigh and in the subcutaneous fat at the left lower quadrant of the abdomen of unknown etiology. Electronically Signed   By: Lorriane Shire M.D.   On: 09/05/2019 10:15   CT HEAD WO CONTRAST  Result Date: 09/05/2019 CLINICAL DATA:  Cerebral hemorrhage suspected. History of COVID-19 pneumonia with cardiac and respiratory failure on ECMO. Nose bleeds. EXAM: CT HEAD WITHOUT CONTRAST TECHNIQUE: Contiguous axial images were obtained from the base of the skull through the vertex without intravenous contrast. COMPARISON:  None. FINDINGS: Brain: There is no evidence of acute infarct, intracranial hemorrhage, mass, midline shift, or extra-axial fluid collection. The ventricles and sulci are normal. Vascular: No hyperdense vessel. Skull: No fracture or suspicious osseous lesion. Sinuses/Orbits: Complete opacification of the paranasal sinuses, mastoid air cells, and tympanic cavities bilaterally. Unremarkable orbits. Other: Partially visualized feeding tube. IMPRESSION: 1. Unremarkable CT appearance of the brain. 2. Pansinusitis.  Large bilateral mastoid and middle ear effusions. Electronically Signed   By: Logan Bores M.D.   On: 09/05/2019 10:03   CT CHEST WO CONTRAST  Result Date: 09/05/2019 CLINICAL DATA:  Abdominal pain and distention. COVID-19  pneumonia. EXAM: CT CHEST, ABDOMEN AND PELVIS WITHOUT CONTRAST TECHNIQUE: Multidetector CT imaging of the chest, abdomen and pelvis was performed following the standard protocol without IV contrast. COMPARISON:  CT scan of the abdomen dated 04/13/2019 FINDINGS: CT CHEST FINDINGS Cardiovascular: Tracheostomy tube, feeding tube and 3 central venous catheters in place in the chest. Bilateral chest tubes. Heart size is normal. No pericardial effusion. Mediastinum/Nodes: No significant axillary, hilar or mediastinal adenopathy. Thyroid gland, trachea, and esophagus are normal. Lungs/Pleura: There is dense consolidative pneumonia throughout both lungs. There is very little remaining aerated lung. No effusions. No pneumothorax. Musculoskeletal: No chest wall mass or suspicious bone lesions identified. CT ABDOMEN PELVIS FINDINGS Hepatobiliary: No focal liver abnormality is seen. No gallstones, gallbladder wall thickening, or biliary dilatation. Pancreas: Unremarkable. No pancreatic ductal dilatation or surrounding inflammatory changes. Spleen: Normal in size without focal abnormality. Adrenals/Urinary Tract: Adrenal glands and kidneys are normal. No hydronephrosis. Foley catheter in the bladder. Small amount of air in the bladder. Stomach/Bowel: Stomach is within normal limits. Appendix appears normal. No evidence of bowel wall thickening, distention, or inflammatory changes. Drain in the rectum. Feeding tube in the fourth portion of the duodenum. Vascular/Lymphatic: Bilateral femoral vein catheters are in place with the tips in the upper inferior vena cava. No adenopathy. Reproductive: Prostate is unremarkable. Other: No ascites or free air. Musculoskeletal: Slight subcutaneous edema in the proximal left  thigh and in the subcutaneous fat at the left lower quadrant of the abdomen of unknown etiology. No significant bone abnormality. IMPRESSION: 1. Dense consolidative pneumonia throughout both lungs. 2. No acute abnormality  of the abdomen or pelvis. 3. Slight subcutaneous edema in the proximal left thigh and in the subcutaneous fat at the left lower quadrant of the abdomen of unknown etiology. Electronically Signed   By: Lorriane Shire M.D.   On: 09/05/2019 10:15   DG CHEST PORT 1 VIEW  Result Date: 09/07/2019 CLINICAL DATA:  Initial evaluation for cardio respiratory problem. EXAM: PORTABLE CHEST 1 VIEW COMPARISON:  Prior radiograph from 09/06/2019. FINDINGS: Right IJ ECMO has been removed. IVC cannula tip terminates at the level of the lower right atrium. Endotracheal tube positioned 3.9 cm above the carina. Feeding tube courses into the abdomen, nonvisualization of the tip. Left subclavian central venous catheter overlies the proximal-mid SVC, stable. Right PICC catheter overlies the distal SVC, unchanged. Cardiomediastinal silhouette obscured, but grossly stable. Lungs hypoinflated. Bilateral chest tubes remain in place, stable. No visible pneumothorax. No obvious large pleural effusion. Extensive bilateral airspace opacity, little interval changed. IMPRESSION: 1. Interval removal of right IJ ECMO cannula. 2. Remaining support apparatus in place as above. Bilateral chest tubes in stable position without appreciable pneumothorax. 3. Extensive bilateral airspace opacities, little interval changed from previous. Electronically Signed   By: Jeannine Boga M.D.   On: 09/07/2019 07:04   DG Chest Port 1 View  Result Date: 09/06/2019 CLINICAL DATA:  ECMO EXAM: PORTABLE CHEST 1 VIEW COMPARISON:  Radiograph 09/05/2019 FINDINGS: Right IJ ECMO cannula tip appears slightly retracted from 1 day prior, now positioned in the mid SVC. IVC cannula tip terminates at the level of the right atrium. Endotracheal tube remains in the mid trachea, 3.7 cm from the carina. Transesophageal feeding tube tip is below the level of imaging. Bilateral chest tubes are again seen. Minimal interval clearing of the dense bilateral airspace opacities in  both lungs. No new areas of opacity are seen. No visible pneumothorax or effusion. Stable cardiomegaly though much of the heart is obscured by overlying opacity. IMPRESSION: 1. Right IJ ECMO cannula tip appears slightly retracted from 1 day prior, now positioned in the mid SVC. 2. Remaining lines and tubes appear unchanged in position. 3. Minimal interval clearing of the dense bilateral airspace opacities. Electronically Signed   By: Lovena Le M.D.   On: 09/06/2019 05:32   DG Chest Port 1 View  Result Date: 09/05/2019 CLINICAL DATA:  ECMO cannula advancement. EXAM: PORTABLE CHEST 1 VIEW COMPARISON:  CT chest 09/05/2019.  Chest x-ray 09/05/2019. FINDINGS: Right IJ ECMO cannula tip noted over the cavoatrial junction. IVC cannula noted over the IVC right atrial junction. Tracheostomy tube, feeding tube, right PICC line, bilateral chest tubes in stable position. No pneumothorax. Diffuse dense bilateral pulmonary infiltrates/edema again noted. Cardiomegaly again noted. IMPRESSION: 1. Right IJ ECMO cannula tip noted over the cavoatrial junction. IVC can noted over the IVC right atrial junction. Remaining lines and tubes including bilateral chest tubes in stable position. No pneumothorax. 2. Diffuse dense bilateral pulmonary infiltrates/edema again noted without interim change. Electronically Signed   By: Marcello Moores  Register   On: 09/05/2019 13:21     Medications:     Scheduled Medications: . sodium chloride   Intravenous Once  . amiodarone  150 mg Intravenous Once  . vitamin C  500 mg Per Tube Daily  . chlorhexidine gluconate (MEDLINE KIT)  15 mL Mouth Rinse BID  .  Chlorhexidine Gluconate Cloth  6 each Topical Daily  . cloNIDine  0.3 mg Per Tube BID  . diazepam  20 mg Per Tube Q6H  . feeding supplement (PRO-STAT SUGAR FREE 64)  60 mL Per Tube BID  . free water  200 mL Per Tube Q8H  . gabapentin  450 mg Per Tube Q8H  . HYDROmorphone  12 mg Per Tube Q4H  . insulin aspart  3-9 Units Subcutaneous Q4H   . insulin detemir  10 Units Subcutaneous BID  . ipratropium-albuterol  3 mL Nebulization Q4H  . mouth rinse  15 mL Mouth Rinse 10 times per day  . montelukast  10 mg Per Tube QHS  . oxymetazoline  1 spray Each Nare BID  . pantoprazole sodium  40 mg Per Tube QHS  . propranolol  20 mg Per Tube TID  . QUEtiapine  50 mg Per Tube BID  . sodium chloride flush  10-40 mL Intracatheter Q12H  . sodium chloride flush  10-40 mL Intracatheter Q12H  . Thrombi-Pad  1 each Topical Once  . Thrombi-Pad  1 each Topical Once  . zinc sulfate  220 mg Per Tube Daily    Infusions: . sodium chloride    . sodium chloride Stopped (09/05/19 1900)  . sodium chloride 10 mL/hr at 09/07/19 0600  . albumin human 25 g (09/06/19 2056)  . amiodarone 30 mg/hr (09/07/19 0500)  .  ceFAZolin (ANCEF) IV 2 g (09/07/19 4259)  . dexmedetomidine (PRECEDEX) IV infusion 1.6 mcg/kg/hr (09/07/19 0600)  . dextrose    . feeding supplement (PIVOT 1.5 CAL) 1,000 mL (09/07/19 0718)  . furosemide (LASIX) infusion 8 mg/hr (09/07/19 0600)  . heparin 2,550 Units/hr (09/07/19 0600)  . HYDROmorphone 20 mg/hr (09/07/19 0600)  . midazolam    . norepinephrine Stopped (09/06/19 2334)  . propofol (DIPRIVAN) infusion Stopped (08/31/19 0910)  . vasopressin (PITRESSIN) infusion - *FOR SHOCK* Stopped (09/05/19 1736)    PRN Medications: Place/Maintain arterial line **AND** sodium chloride, sodium chloride, sodium chloride, acetaminophen (TYLENOL) oral liquid 160 mg/5 mL, acetaminophen, albumin human, dextrose, hydrALAZINE, HYDROmorphone, magnesium hydroxide, midazolam, ondansetron (ZOFRAN) IV, senna-docusate, sodium chloride, sodium chloride flush, sodium chloride flush   Assessment/Plan:    1. Acute hypoxic/hypercarbic respiratory failure due to COVID PNA: Remained markedly hypoxic despite full support. Intubated 12/26. S/p bilateral CTs 12/26 for pneumothoraces.  TEE on 12/26 LVEF 70% RV ok. VV ECMO begun 12/26. Tracheostomy 12/29.  COVID  ARDS at this point. ECMO circuit changed 1/8 & 1/23. SVC cannula removed 1/29.  - Good oxygenation this morning, decreased FiO2 to 0.8 and decreased flow to 3650 rpm, oxygen saturation remained 94%.  - TVs improving now > 300 ml - Massively volume overload. Continue lasix gtt, will increase to 10 mg/hr today to try to keep I/Os more even. BUN/creatinine stable.  - Epistaxis remain ongoing issue. Continue packing and Ancef - LDH 480 -> 412 -> 445 -> 432 -> 450 -> 481 -> 503 -> 580.  Creeping up, watch closely - Continue heparin, level acceptable.  - wean sedation as tolerated.  - Continue chest PT - overall oxygenation improving. TVs improved.   2. Bilateral PTX: Due to barotrauma.  - Driving pressure minimized at < 15 Stable. No change  3. COVID PNA: management of COVID infection per CCM. CXR with bilateral multifocal PNA progressing to likely ARDS.  - s/p 10 days of remdesivir - 12/16, 12/2 s/p tociluzimab - 12/17 s/p convalescent plasma - Decadron at 83m/daily completed 1/11 - Completed cefepime/vanc -  On Ancef for nasal packing.  4. ID: Empiric coverage with vancomycin/cefepime initially for possible secondary bacterial PNA, course completed.   - treated with cefepime and vanc for + cx with staph epidimidis, MSSA and serratia (sensitivities ok). - On cefazolin for nasal packing.   5. Thrombocytopenia: Stable, likely low level hemolysis + critical illness.   6. Anemia: Bleeding at trach site resolved but still with epistaxis - hgb 8.9  today - Transfuse hgb < 8  7. FEN - continue TFs via oral Cor-trak  8.Dilated R pupil - head CT 1/28 no acute  9. Hypernatremia - NA 151. Increased free water.   D/w with ECMO team (ECMO coordinator/specialists, CCM, TCTS and pharmD) at bedside on multidisciplinary rounds. Continue to await ARDS recovery. Details as above.   Length of Stay: [place] Performed by: Loralie Champagne  Total critical care time: 35 minutes  Critical  care time was exclusive of separately billable procedures and treating other patients.  Critical care was necessary to treat or prevent imminent or life-threatening deterioration.  Critical care was time spent personally by me (independent of midlevel providers or residents) on the following activities: development of treatment plan with patient and/or surrogate as well as nursing, discussions with consultants, evaluation of patient's response to treatment, examination of patient, obtaining history from patient or surrogate, ordering and performing treatments and interventions, ordering and review of laboratory studies, ordering and review of radiographic studies, pulse oximetry and re-evaluation of patient's condition.    Loralie Champagne MD 09/07/2019, 7:58 AM  Advanced Heart Failure Team Pager [phone] (M-F; 7a - 4p)  Please contact [facility] for night-coverage after hours (4p -7a ) and weekends on [email or web address]

## 2019-09-07 NOTE — Progress Notes (Signed)
NAME:  [name], MRN:  [id number], DOB:  1976/04/07, LOS: 62 ADMISSION DATE:  07/22/2019, CONSULTATION DATE:  12/24 REFERRING MD:  Sloan Leiter, CHIEF COMPLAINT:  Dyspnea   Brief History   44 y/o M admitted 12/14 with COVID pneumonia causing acute hypoxemic respiratory failure. Developed pneumomediastinum 12/23 with concern for possible bilateral small pneumothoraces on 12/24.  PCCM consulted for evaluation of pneumothoraces  Past Medical History  GERD HTN Asthma  Significant Hospital Events   12/14 admit with hypoxemic respiratory failure in setting of COVID-19 pneumonia 12/23 noted to have pneumomediastinum on chest x-ray 12/24 PCCM consulted for evaluation 12/26 worsening hypoxemia, intubated  12/27 VV fem/fem ECMO cannulation See bedside nursing event notes for full rundown of procedures after 12/27 1/30 improved cxr, improved lung compliance, TV 350s on 15PC   Consults:  PCCM, CTS, CHF, Palliative  Procedures:  See bedside nursing event notes for full rundown of procedures.  Significant Diagnostic Tests:   CT chest 12/22 >> extensive pneumomediastinum, multifocal patchy bilateral groundglass opacities CXR 1/25 >> improving bilateral infiltrates. ECMO cannulae in place.  Micro Data:  BCx2 12/14 >> negative  Sputum 12/15 >> negative  1/9 bronch: serratia and MSSA 1/2 acid fast smear bronch: negative 1/2 fungal cx bronch: pending 1/12 BAL: serratia 1/18 BAL >> rare Serratia.  Antimicrobials:  Received remdesivir, steroids, actemra and convalescent plasma  5 days cefepime 12/24-12/29 Cefepime 1/4->1/14 vanc 1/4->1/14 Ceftriaxone 1/14 >>   Interim history/subjective:   One episode of desaturations overnight.  Repeat ABG PaCO1 60s , sweep gas was increased. Vent increased to 100% fiO2. Remains on lasix ggt with good UOP.   Objective   Blood pressure (!) 150/52, pulse (!) 111, temperature 97.9 F (36.6 C), temperature source Core, resp. rate (!) 22, height _0   (1.727 m), weight (!) 143.9 kg, SpO2 95 %. CVP:  [7 mmHg] 7 mmHg  Vent Mode: PCV FiO2 (%):  [80 %-100 %] 80 % Set Rate:  [15 bmp] 15 bmp PEEP:  [15 cmH20] 15 cmH20 Plateau Pressure:  [25 cmH20-30 cmH20] 25 cmH20   Intake/Output Summary (Last 24 hours) at 09/07/2019 0716 Last data filed at 09/07/2019 5400 Gross per 24 hour  Intake 6325.2 ml  Output 4105 ml  Net 2220.2 ml   Filed Weights   09/05/19 0440 09/06/19 0500 09/07/19 0500  Weight: (!) 140.9 kg (!) 144.1 kg (!) 143.9 kg   Examination General appearance: 44 y.o., male, NAD, conversant  Eyes: anicteric sclerae, per nursing off sedation will track  HENT: NCAT; oropharynx, MMM, cortrack per oral  Neck: Trachea midline; FROM, supple, lymphadenopathy, no JVD Lungs: BL vented breaths, no wheeze Chest: BL chest tubes, normal tidaling  CV: RRR, S1, S2, no MRGs  Abdomen: Soft, non-tender; non-distended, BS present  Extremities: diffuse anasarca, bl UE and LE edema  Skin: anterior chest folliculitis  Psych: Appropriate affect Neuro: heavy sedated   CXR increased consolidation of basilar lung fields   Now better able to see the diaphragms. Continued dense consolidation of left base. The patient's images have been independently reviewed by me.    CT head: pansinusitis.  CT chest: Dense bilateral consolidation in dependent lungs both sides  CT abdomen: Cannulae in position in IVC.  Flow (LPM): 3.98 ECMO Mode: VV ECMO Device: Cardiohelp  $ Ventilator Initial/Subsequent : Subsequent, Vent Mode: PCV, Set Rate: 15 bmp, FiO2 (%): 80 %, I Time: 0.8 Sec(s), PEEP: 15 cmH20 Vt today 350 ml  Net IO Since Admission: 86,761.71 mL [09/07/19 0716]  Resolved Hospital Problem list   MSSA and serratia marcescens pneumonia   Assessment & Plan:   Acute hypoxemic respiratory failure with ARDS secondary to COVID-19, status post tracheostomy on mechanical ventilation and VV ECMO support.  -Slowly resolving the severe diffuse alveolar  damage -Chest x-ray reviewed this morning, slight improvement with better aeration of both lungs. -Tidal volumes also slowly improving, 352-380 on 15 of pressure control. -Patient breathing over set mechanical ventilator rate with minute ventilation of approximately 7.5 L. -Patient tolerated removal of SVC cannula on 09/06/2019. -Slowly titrating off continuous sedation needs with Versed plus Dilaudid plus Precedex. -Only plans to aggressively resedate when gas exchange or circulation pressure issues arise.  Posterior epistaxis. Nasal packing removed yesterday. Continue moisture to the nares with petroleum jelly We will use oxymetazoline or topical TXA nebulized to control recurrent bleeding.  Hypernatremia - start FW 235m Q8H  Daily Goals Checklist   Pain/Anxiety/Delirium protocol (if indicated): Versed plus Dilaudid plus Precedex plus Seroquel VAP protocol (if indicated): Bundle in place. Respiratory support goals: Pressure control ventilation with ultralow protective lung ventilation Blood pressure target:  MAP>65, off pressors ECMO SVC cannula removed 09/06/2019 DVT prophylaxis: Remains on systemic heparin Nutrition Status: Nutrition Problem: Increased nutrient needs Etiology: acute illness(COVID PNA) Signs/Symptoms: estimated needs Interventions: Tube feeding, Prostat GI prophylaxis: Protonix per tube. Fluid status goals: Continue furosemide infusion for approximately 1.5 L net negative per day Urinary catheter: Guide hemodynamic management Glucose control: Euglycemic control with SSI Mobility/therapy needs: Bedrest.  Daily labs: as per ECMO protocol. Code Status: Full code  Family Communication:family updated by RN. Disposition: ICU  Labs   CBC: CBC Latest Ref Rng & Units 09/07/2019 09/07/2019 09/06/2019  WBC 4.0 - 10.5 K/uL - 12.4(H) -  Hemoglobin 13.0 - 17.0 g/dL 9.5(L) 8.9(L) 9.2(L)  Hematocrit 39.0 - 52.0 % 28.0(L) 30.3(L) 27.0(L)  Platelets 150 - 400 K/uL - 141(L)  -    Basic Metabolic Panel: BMP Latest Ref Rng & Units 09/07/2019 09/07/2019 09/06/2019  Glucose 70 - 99 mg/dL - 163(H) -  BUN 6 - 20 mg/dL - 81(H) -  Creatinine 0.61 - 1.24 mg/dL - 0.99 -  Sodium 135 - 145 mmol/L 151(H) 151(H) 151(H)  Potassium 3.5 - 5.1 mmol/L 4.1 4.2 3.7  Chloride 98 - 111 mmol/L - 107 -  CO2 22 - 32 mmol/L - 30 -  Calcium 8.9 - 10.3 mg/dL - 9.4 -    GFR: Estimated Creatinine Clearance: 131.4 mL/min (by C-G formula based on SCr of 0.99 mg/dL). Recent Labs  Lab 09/04/19 0416 09/04/19 1617 09/05/19 0401 09/05/19 0402 09/06/19 0405 09/06/19 1250 09/06/19 1855 09/07/19 0433  WBC 12.3*   < >  --    < > 14.1* 10.2 11.2* 12.4*  LATICACIDVEN 1.1  --  1.1  --  1.3  --   --  1.2   < > = values in this interval not displayed.    Liver Function Tests: Recent Labs  Lab 08/31/19 1150  AST 24  ALT 26  ALKPHOS 75  BILITOT 0.7  PROT 6.2*  ALBUMIN 2.2*   No results for input(s): LIPASE, AMYLASE in the last 168 hours. No results for input(s): AMMONIA in the last 168 hours.  ABG    Component Value Date/Time   PHART 7.338 (L) 09/07/2019 0443   PCO2ART 61.2 (H) 09/07/2019 0443   PO2ART 72.0 (L) 09/07/2019 0443   HCO3 33.0 (H) 09/07/2019 0443   TCO2 35 (H) 09/07/2019 0443   ACIDBASEDEF 2.0 08/28/2019 1519  O2SAT 93.0 09/07/2019 0443     Coagulation Profile: No results for input(s): INR, PROTIME in the last 168 hours.  Cardiac Enzymes: No results for input(s): CKTOTAL, CKMB, CKMBINDEX, TROPONINI in the last 168 hours.  HbA1C: Hgb A1c MFr Bld  Date/Time Value Ref Range Status  07/24/2019 04:50 AM 6.7 (H) 4.8 - 5.6 % Final    Comment:    (NOTE) Pre diabetes:          5.7%-6.4% Diabetes:              >6.4% Glycemic control for   <7.0% adults with diabetes   09/30/2017 05:36 AM 5.7 (H) 4.8 - 5.6 % Final    Comment:    (NOTE)         Prediabetes: 5.7 - 6.4         Diabetes: >6.4         Glycemic control for adults with diabetes: <7.0      CBG: Recent Labs  Lab 09/06/19 1122 09/06/19 1627 09/06/19 1957 09/06/19 2314 09/07/19 0447  GLUCAP 185* 184* 182* 187* 172*    This patient is critically ill with multiple organ system failure; which, requires frequent high complexity decision making, assessment, support, evaluation, and titration of therapies. This was completed through the application of advanced monitoring technologies and extensive interpretation of multiple databases. During this encounter critical care time was devoted to patient care services described in this note for 55 minutes.  [facility] 09/07/2019 7:17 AM

## 2019-09-07 NOTE — Progress Notes (Signed)
7 Days Post-Op Procedure(s) (LRB): NASAL ENDOSCOPY WITH EPISTAXIS CONTROL WITH CAUTERIZATION (N/A) Subjective: Patient breathing sedated on ventilator-tidal volume 340 cc on pressure control mode.  FiO2 reduced to 80%. Chest x-ray shows some improvement in bilateral lung aeration No significant hematoma in the right neck after removal of 19 French drainage cannula yesterday Adequate flows on VV ECMO, sweep at 15 L  Objective: Vital signs in last 24 hours: Temp:  [97.2 F (36.2 C)-97.9 F (36.6 C)] 97.9 F (36.6 C) (01/30 0800) Pulse Rate:  [67-114] 105 (01/30 0900) Cardiac Rhythm: Normal sinus rhythm (01/30 0800) Resp:  [14-34] 19 (01/30 0900) BP: (112-164)/(44-58) 150/54 (01/30 0800) SpO2:  [85 %-97 %] 92 % (01/30 0900) Arterial Line BP: (72-167)/(33-63) 155/57 (01/30 0900) FiO2 (%):  [80 %-100 %] 80 % (01/30 0814) Weight:  [143.9 kg] 143.9 kg (01/30 0500)  Hemodynamic parameters for last 24 hours: CVP:  [7 mmHg-9 mmHg] 9 mmHg  Intake/Output from previous day: 01/29 0701 - 01/30 0700 In: 6325.2 [I.V.:2954.2; Blood:605; NG/GT:2010; IV Piggyback:756] Out: 4105 [Urine:3635; Stool:450; Chest Tube:20] Intake/Output this shift: Total I/O In: 1536 [I.V.:462.8; NG/GT:225; IV Piggyback:848.2] Out: 450 [Urine:450]  Exam Cannula sites clean and dry, minimal neck hematoma Breath sounds slightly improved Persistent peripheral edema continuing IV Lasix  Lab Results: Recent Labs    09/06/19 1855 09/06/19 1955 09/07/19 0433 09/07/19 0443  WBC 11.2*  --  12.4*  --   HGB 8.7*   < > 8.9* 9.5*  HCT 29.6*   < > 30.3* 28.0*  PLT 136*  --  141*  --    < > = values in this interval not displayed.   BMET:  Recent Labs    09/06/19 1911 09/06/19 1955 09/07/19 0433 09/07/19 0443  NA 150*   < > 151* 151*  K 3.9   < > 4.2 4.1  CL 110  --  107  --   CO2 28  --  30  --   GLUCOSE 204*  --  163*  --   BUN 79*  --  81*  --   CREATININE 0.94  --  0.99  --   CALCIUM 9.1  --  9.4  --     < > = values in this interval not displayed.    PT/INR: No results for input(s): LABPROT, INR in the last 72 hours. ABG    Component Value Date/Time   PHART 7.338 (L) 09/07/2019 0443   HCO3 33.0 (H) 09/07/2019 0443   TCO2 35 (H) 09/07/2019 0443   ACIDBASEDEF 2.0 08/28/2019 1519   O2SAT 93.0 09/07/2019 0443   CBG (last 3)  Recent Labs    09/06/19 1957 09/06/19 2314 09/07/19 0447  GLUCAP 182* 187* 172*    Assessment/Plan: S/P Procedure(s) (LRB): NASAL ENDOSCOPY WITH EPISTAXIS CONTROL WITH CAUTERIZATION (N/A) VV ECMO for history of COVID-19 pneumonitis Morbid obesity Maintaining sinus rhythm on IV amiodarone, hospital history of atrial fibrillation   LOS: 47 days    [name] 09/07/2019

## 2019-09-07 NOTE — Progress Notes (Signed)
PCCM:  Paged regarding hypotension. Patient evaluated at bedside.  New in onset midmorning   CVP was 8 this am from PICC VV Cannulated femorally  On lasix ggt, this was held  Venous pressures stable, but flow dropped this morning due to higher negative venous pull -110s Flow dropped to get pressures in -90-ish   Hypotension started when patient was repositioned in bed  I suspect related some to fluid shift  Hgb 8.4 on pump  STAT CXR reviewed: B/l CT, stable, BL infiltrates, The patient's images have been independently reviewed by me.    Patient had escalating doses of levo + vaso Bolused albumin 5% and 1 unit PRBCs  Coming slowly off pressors again.  I suspect hypotension is related to intravascular volume depletion   Sats stable on 80% FiO2, TVs now 260s, loss of about 100cs, I suspect from de-recruitment following position change.   Case discussed with nursing ECMO care team at bedside  This patient is critically ill with multiple organ system failure; which, requires frequent high complexity decision making, assessment, support, evaluation, and titration of therapies. This was completed through the application of advanced monitoring technologies and extensive interpretation of multiple databases. During this encounter critical care time was devoted to patient care services described in this note for 32 minutes.   Garner Nash, DO [facility] 09/07/2019 2:10 PM

## 2019-09-07 NOTE — Progress Notes (Signed)
   Palliative Medicine Inpatient Follow Up Note   HPI: Per intensivist notes -->44 y/o M admitted 12/14 with COVIDpneumonia causing acutehypoxemic respiratory failure. Developed pneumomediastinum 12/23 with concern for possible bilateral small pneumothoraces on 12/24. PCCM consulted for evaluation of pneumothoraces.During admission patient has had placement of bilateral chest tubes, and is now on VV ECMO, and vent by trach.   Palliative medicine consulted for support and GOC.  Today's Discussion (09/07/2019): Chart reviewed. Checked in with nursing team. As of present patient has had some issues maintaining adequate SPO2 saturations status post movement. Presently he is stable.   Patients wife, [name] called to provide an update. She stated that she has an aunt visiting who had been watching one of her dogs. She said that her FMLA paperwork has been straightened out.   All questions answered.   Will continue to communicate with family incrementally.   Time Spent: 15 Greater than 50% of the time was spent in counseling and coordination of care ______________________________________________________________________________________ [place] Team Team Cell Phone: [phone] Please utilize secure chat with additional questions, if there is no response within 30 minutes please call the above phone number  Palliative Medicine Team providers are available by phone from 7am to 7pm daily and can be reached through the team cell phone.  Should this patient require assistance outside of these hours, please call the patient's attending physician.

## 2019-09-07 NOTE — Progress Notes (Signed)
ANTICOAGULATION CONSULT NOTE - Follow Up Consult  Pharmacy Consult for Heparin Indication: ECMO  Patient Measurements: Height: 5' 8"  (172.7 cm) Weight: (!) 328 lb 7.8 oz (149 kg) IBW/kg (Calculated) : 68.4 Heparin Dosing Weight: 104.5kg  Vital Signs: Temp: 97.5 F (36.4 C) (01/21 1600) Temp Source: Axillary (01/21 1600) Pulse Rate: 87 (01/21 1700)  Labs: Recent Labs    09/06/19 0405 09/06/19 0405 09/06/19 0412 09/06/19 1250 09/06/19 1622 09/06/19 1629 09/06/19 1855 09/06/19 1855 09/06/19 1911 09/06/19 1950 09/06/19 1955 09/06/19 1955 09/07/19 0433 09/07/19 0443  HGB 8.7*  --    < > 7.6*  --    < > 8.7*   < >  --   --  9.2*   < > 8.9* 9.5*  HCT 29.4*  --    < > 25.9*  --    < > 29.6*   < >  --   --  27.0*  --  30.3* 28.0*  PLT 181  --    < > 130*  --   --  136*  --   --   --   --   --  141*  --   APTT 106*  --   --   --  44*  --   --   --   --   --   --   --  90*  --   HEPARINUNFRC 0.39   < >  --   --  <0.10*  --   --   --   --  0.21*  --   --  0.32  --   CREATININE 0.84  --   --   --   --   --   --   --  0.94  --   --   --  0.99  --    < > = values in this interval not displayed.    Assessment: 44yo male on VV ECMO on heparin infusion.  -Heparin level therapeutic at 0.41, aPTT at 87s, on 2600 units/hr. Nosebleed appears stable, remains to be packed.   Heparin held yesterday briefly for nose bleed/repacking>>then re-started in the evening.   Heparin level at goal this AM on 2550 units/hr.  Hgb fairly stable, platelets improving.  LDH creeping up.  Goal of Therapy:  Heparin level 0.3-0.5 units/ml   Plan:  Continue IV heparin at 2550 units/hr. Monitor heparin level and CBC 5AM/5PM, daily LDH Monitor for any increase in bleeding from mouth/nose  [place] Crane, Roylene Reason, [place] Pharmacist Phone [phone]  09/07/2019 7:41 AM

## 2019-09-07 NOTE — Progress Notes (Signed)
ANTICOAGULATION CONSULT NOTE - Follow Up Consult  Pharmacy Consult for Heparin Indication: ECMO  Patient Measurements: Height: 5' 8"  (172.7 cm) Weight: (!) 328 lb 7.8 oz (149 kg) IBW/kg (Calculated) : 68.4 Heparin Dosing Weight: 104.5kg  Vital Signs: Temp: 97.5 F (36.4 C) (01/21 1600) Temp Source: Axillary (01/21 1600) Pulse Rate: 87 (01/21 1700)  Labs: Recent Labs    09/06/19 0405 09/06/19 0405 09/06/19 0412 09/06/19 1250 09/06/19 1622 09/06/19 1629 09/06/19 1855 09/06/19 1855 09/06/19 1911 09/06/19 1950 09/06/19 1955 09/06/19 1955 09/07/19 0433 09/07/19 0443  HGB 8.7*  --    < > 7.6*  --    < > 8.7*   < >  --   --  9.2*   < > 8.9* 9.5*  HCT 29.4*  --    < > 25.9*  --    < > 29.6*   < >  --   --  27.0*  --  30.3* 28.0*  PLT 181  --    < > 130*  --   --  136*  --   --   --   --   --  141*  --   APTT 106*  --   --   --  44*  --   --   --   --   --   --   --  90*  --   HEPARINUNFRC 0.39   < >  --   --  <0.10*  --   --   --   --  0.21*  --   --  0.32  --   CREATININE 0.84  --   --   --   --   --   --   --  0.94  --   --   --  0.99  --    < > = values in this interval not displayed.    Assessment: 44yo male on VV ECMO on heparin infusion.  -Heparin level therapeutic at 0.41, aPTT at 87s, on 2600 units/hr. Nosebleed appears stable, remains to be packed.   Heparin held yesterday briefly for nose bleed>>then re-started  1/30 AM update:  Heparin level therapeutic after rate increase after re-start Hgb low/stable  Goal of Therapy:  Heparin level 0.3-0.5 units/ml   Plan:  Continue IV heparin at 2550 units/hr. Monitor heparin level and CBC 5AM/5PM, daily LDH Monitor for any increase in bleeding from mouth/nose  Narda Bonds, PharmD, [place] Pharmacist Phone: [phone]

## 2019-09-08 ENCOUNTER — Inpatient Hospital Stay (HOSPITAL_COMMUNITY): Payer: Managed Care, Other (non HMO)

## 2019-09-08 DIAGNOSIS — Z9689 Presence of other specified functional implants: Secondary | ICD-10-CM

## 2019-09-08 LAB — POCT I-STAT 7, (LYTES, BLD GAS, ICA,H+H)
Acid-Base Excess: 3 mmol/L — ABNORMAL HIGH (ref 0.0–2.0)
Acid-Base Excess: 2 mmol/L (ref 0.0–2.0)
Acid-Base Excess: 3 mmol/L — ABNORMAL HIGH (ref 0.0–2.0)
Acid-Base Excess: 3 mmol/L — ABNORMAL HIGH (ref 0.0–2.0)
Acid-Base Excess: 3 mmol/L — ABNORMAL HIGH (ref 0.0–2.0)
Bicarbonate: 29.2 mmol/L — ABNORMAL HIGH (ref 20.0–28.0)
Bicarbonate: 28.4 mmol/L — ABNORMAL HIGH (ref 20.0–28.0)
Bicarbonate: 29.1 mmol/L — ABNORMAL HIGH (ref 20.0–28.0)
Bicarbonate: 29.6 mmol/L — ABNORMAL HIGH (ref 20.0–28.0)
Bicarbonate: 29.2 mmol/L — ABNORMAL HIGH (ref 20.0–28.0)
Calcium, Ion: 1.27 mmol/L (ref 1.15–1.40)
Calcium, Ion: 1.26 mmol/L (ref 1.15–1.40)
Calcium, Ion: 1.28 mmol/L (ref 1.15–1.40)
Calcium, Ion: 1.31 mmol/L (ref 1.15–1.40)
Calcium, Ion: 1.33 mmol/L (ref 1.15–1.40)
HCT: 26 % — ABNORMAL LOW (ref 39.0–52.0)
HCT: 24 % — ABNORMAL LOW (ref 39.0–52.0)
HCT: 26 % — ABNORMAL LOW (ref 39.0–52.0)
HCT: 25 % — ABNORMAL LOW (ref 39.0–52.0)
HCT: 25 % — ABNORMAL LOW (ref 39.0–52.0)
Hemoglobin: 8.8 g/dL — ABNORMAL LOW (ref 13.0–17.0)
Hemoglobin: 8.2 g/dL — ABNORMAL LOW (ref 13.0–17.0)
Hemoglobin: 8.8 g/dL — ABNORMAL LOW (ref 13.0–17.0)
Hemoglobin: 8.5 g/dL — ABNORMAL LOW (ref 13.0–17.0)
Hemoglobin: 8.5 g/dL — ABNORMAL LOW (ref 13.0–17.0)
O2 Saturation: 89 %
O2 Saturation: 83 %
O2 Saturation: 88 %
O2 Saturation: 88 %
O2 Saturation: 85 %
Patient temperature: 98.2
Patient temperature: 36.5
Patient temperature: 36.7
Patient temperature: 98.7
Patient temperature: 36.8
Potassium: 3.7 mmol/L (ref 3.5–5.1)
Potassium: 4 mmol/L (ref 3.5–5.1)
Potassium: 3.9 mmol/L (ref 3.5–5.1)
Potassium: 4 mmol/L (ref 3.5–5.1)
Potassium: 3.9 mmol/L (ref 3.5–5.1)
Sodium: 151 mmol/L — ABNORMAL HIGH (ref 135–145)
Sodium: 152 mmol/L — ABNORMAL HIGH (ref 135–145)
Sodium: 152 mmol/L — ABNORMAL HIGH (ref 135–145)
Sodium: 151 mmol/L — ABNORMAL HIGH (ref 135–145)
Sodium: 151 mmol/L — ABNORMAL HIGH (ref 135–145)
TCO2: 31 mmol/L (ref 22–32)
TCO2: 30 mmol/L (ref 22–32)
TCO2: 31 mmol/L (ref 22–32)
TCO2: 31 mmol/L (ref 22–32)
TCO2: 31 mmol/L (ref 22–32)
pCO2 arterial: 49.9 mmHg — ABNORMAL HIGH (ref 32.0–48.0)
pCO2 arterial: 51.7 mmHg — ABNORMAL HIGH (ref 32.0–48.0)
pCO2 arterial: 53.2 mmHg — ABNORMAL HIGH (ref 32.0–48.0)
pCO2 arterial: 57.9 mmHg — ABNORMAL HIGH (ref 32.0–48.0)
pCO2 arterial: 55.2 mmHg — ABNORMAL HIGH (ref 32.0–48.0)
pH, Arterial: 7.374 (ref 7.350–7.450)
pH, Arterial: 7.346 — ABNORMAL LOW (ref 7.350–7.450)
pH, Arterial: 7.345 — ABNORMAL LOW (ref 7.350–7.450)
pH, Arterial: 7.318 — ABNORMAL LOW (ref 7.350–7.450)
pH, Arterial: 7.33 — ABNORMAL LOW (ref 7.350–7.450)
pO2, Arterial: 59 mmHg — ABNORMAL LOW (ref 83.0–108.0)
pO2, Arterial: 49 mmHg — ABNORMAL LOW (ref 83.0–108.0)
pO2, Arterial: 58 mmHg — ABNORMAL LOW (ref 83.0–108.0)
pO2, Arterial: 61 mmHg — ABNORMAL LOW (ref 83.0–108.0)
pO2, Arterial: 55 mmHg — ABNORMAL LOW (ref 83.0–108.0)

## 2019-09-08 LAB — BASIC METABOLIC PANEL
Anion gap: 15 (ref 5–15)
Anion gap: 13 (ref 5–15)
BUN: 98 mg/dL — ABNORMAL HIGH (ref 6–20)
BUN: 106 mg/dL — ABNORMAL HIGH (ref 6–20)
CO2: 27 mmol/L (ref 22–32)
CO2: 27 mmol/L (ref 22–32)
Calcium: 9.4 mg/dL (ref 8.9–10.3)
Calcium: 9.4 mg/dL (ref 8.9–10.3)
Chloride: 110 mmol/L (ref 98–111)
Chloride: 110 mmol/L (ref 98–111)
Creatinine, Ser: 1.19 mg/dL (ref 0.61–1.24)
Creatinine, Ser: 1.19 mg/dL (ref 0.61–1.24)
GFR calc Af Amer: >60 mL/min (ref >60)
GFR calc Af Amer: >60 mL/min (ref >60)
GFR calc non Af Amer: >60 mL/min (ref >60)
GFR calc non Af Amer: >60 mL/min (ref >60)
Glucose, Bld: 208 mg/dL — ABNORMAL HIGH (ref 70–99)
Glucose, Bld: 223 mg/dL — ABNORMAL HIGH (ref 70–99)
Potassium: 3.7 mmol/L (ref 3.5–5.1)
Potassium: 4 mmol/L (ref 3.5–5.1)
Sodium: 152 mmol/L — ABNORMAL HIGH (ref 135–145)
Sodium: 150 mmol/L — ABNORMAL HIGH (ref 135–145)

## 2019-09-08 LAB — GLUCOSE, CAPILLARY
Glucose-Capillary: 152 mg/dL — ABNORMAL HIGH (ref 70–99)
Glucose-Capillary: 158 mg/dL — ABNORMAL HIGH (ref 70–99)
Glucose-Capillary: 178 mg/dL — ABNORMAL HIGH (ref 70–99)
Glucose-Capillary: 180 mg/dL — ABNORMAL HIGH (ref 70–99)
Glucose-Capillary: 183 mg/dL — ABNORMAL HIGH (ref 70–99)
Glucose-Capillary: 188 mg/dL — ABNORMAL HIGH (ref 70–99)
Glucose-Capillary: 191 mg/dL — ABNORMAL HIGH (ref 70–99)
Glucose-Capillary: 193 mg/dL — ABNORMAL HIGH (ref 70–99)
Glucose-Capillary: 196 mg/dL — ABNORMAL HIGH (ref 70–99)
Glucose-Capillary: 197 mg/dL — ABNORMAL HIGH (ref 70–99)
Glucose-Capillary: 202 mg/dL — ABNORMAL HIGH (ref 70–99)
Glucose-Capillary: 210 mg/dL — ABNORMAL HIGH (ref 70–99)
Glucose-Capillary: 73 mg/dL (ref 70–99)

## 2019-09-08 LAB — CBC
HCT: 29 % — ABNORMAL LOW (ref 39.0–52.0)
HCT: 28.5 % — ABNORMAL LOW (ref 39.0–52.0)
Hemoglobin: 8.6 g/dL — ABNORMAL LOW (ref 13.0–17.0)
Hemoglobin: 8.4 g/dL — ABNORMAL LOW (ref 13.0–17.0)
MCH: 28 pg (ref 26.0–34.0)
MCH: 28.1 pg (ref 26.0–34.0)
MCHC: 29.7 g/dL — ABNORMAL LOW (ref 30.0–36.0)
MCHC: 29.5 g/dL — ABNORMAL LOW (ref 30.0–36.0)
MCV: 94.5 fL (ref 80.0–100.0)
MCV: 95.3 fL (ref 80.0–100.0)
Platelets: 128 10*3/uL — ABNORMAL LOW (ref 150–400)
Platelets: 123 10*3/uL — ABNORMAL LOW (ref 150–400)
RBC: 3.07 MIL/uL — ABNORMAL LOW (ref 4.22–5.81)
RBC: 2.99 MIL/uL — ABNORMAL LOW (ref 4.22–5.81)
RDW: 18.8 % — ABNORMAL HIGH (ref 11.5–15.5)
RDW: 18.8 % — ABNORMAL HIGH (ref 11.5–15.5)
WBC: 10.8 10*3/uL — ABNORMAL HIGH (ref 4.0–10.5)
WBC: 12 10*3/uL — ABNORMAL HIGH (ref 4.0–10.5)
nRBC: 5.5 % — ABNORMAL HIGH (ref 0.0–0.2)
nRBC: 4.5 % — ABNORMAL HIGH (ref 0.0–0.2)

## 2019-09-08 LAB — LACTATE DEHYDROGENASE: LDH: 509 U/L — ABNORMAL HIGH (ref 98–192)

## 2019-09-08 LAB — HEPARIN LEVEL (UNFRACTIONATED)
Heparin Unfractionated: 0.37 IU/mL (ref 0.30–0.70)
Heparin Unfractionated: 0.39 IU/mL (ref 0.30–0.70)

## 2019-09-08 LAB — COOXEMETRY PANEL
Carboxyhemoglobin: 3.6 % — ABNORMAL HIGH (ref 0.5–1.5)
Methemoglobin: 1.3 % (ref 0.0–1.5)
O2 Saturation: 74.3 %
Total hemoglobin: 8.4 g/dL — ABNORMAL LOW (ref 12.0–16.0)

## 2019-09-08 LAB — FIBRINOGEN: Fibrinogen: 655 mg/dL — ABNORMAL HIGH (ref 210–475)

## 2019-09-08 LAB — APTT
aPTT: 113 seconds — ABNORMAL HIGH (ref 24–36)
aPTT: 104 seconds — ABNORMAL HIGH (ref 24–36)

## 2019-09-08 LAB — LACTIC ACID, PLASMA: Lactic Acid, Venous: 1.3 mmol/L (ref 0.5–1.9)

## 2019-09-08 MED ORDER — POTASSIUM CHLORIDE 20 MEQ/15ML (10%) PO SOLN
40.0000 meq | Freq: Once | ORAL | Status: AC
  Administered 2019-09-08: 40 meq
  Filled 2019-09-08: qty 30

## 2019-09-08 MED ORDER — ALBUMIN HUMAN 25 % IV SOLN: 25.0000 g | Freq: Four times a day (QID) | INTRAVENOUS | Status: DC | PRN

## 2019-09-08 MED ORDER — METOLAZONE 2.5 MG PO TABS
2.5000 mg | ORAL_TABLET | Freq: Once | ORAL | Status: AC
  Administered 2019-09-08: 2.5 mg via ORAL
  Filled 2019-09-08: qty 1

## 2019-09-08 MED ORDER — WHITE PETROLATUM EX OINT
TOPICAL_OINTMENT | Freq: Two times a day (BID) | CUTANEOUS | Status: DC
  Administered 2019-09-08 – 2019-09-12 (×8): 0.2 via TOPICAL
  Administered 2019-09-13: 1 via TOPICAL
  Administered 2019-09-17 – 2019-09-21 (×5): 0.2 via TOPICAL
  Administered 2019-09-22 – 2019-09-23 (×2): 1 via TOPICAL
  Administered 2019-09-24: 0.2 via TOPICAL
  Administered 2019-09-24: 1 via TOPICAL
  Administered 2019-09-25 – 2019-10-12 (×28): 0.2 via TOPICAL
  Filled 2019-09-08: qty 28.35

## 2019-09-08 MED ORDER — AMIODARONE LOAD VIA INFUSION
150.0000 mg | Freq: Once | INTRAVENOUS | Status: AC
  Administered 2019-09-08: 150 mg via INTRAVENOUS

## 2019-09-08 MED ORDER — CHLORHEXIDINE GLUCONATE CLOTH 2 % EX PADS
6.0000 | MEDICATED_PAD | Freq: Every day | CUTANEOUS | Status: DC
  Administered 2019-09-09 – 2019-09-22 (×14): 6 via TOPICAL

## 2019-09-08 MED ORDER — GABAPENTIN 250 MG/5ML PO SOLN
200.0000 mg | Freq: Three times a day (TID) | ORAL | Status: DC
  Administered 2019-09-08 – 2019-09-24 (×44): 200 mg
  Filled 2019-09-08 (×40): qty 4
  Filled 2019-09-08: qty 2
  Filled 2019-09-08 (×10): qty 4

## 2019-09-08 MED ORDER — ALBUMIN HUMAN 25 % IV SOLN
25.0000 g | INTRAVENOUS | Status: DC | PRN
  Administered 2019-09-08 – 2019-09-09 (×3): 25 g via INTRAVENOUS
  Administered 2019-09-09 – 2019-09-13 (×3): 12.5 g via INTRAVENOUS
  Filled 2019-09-08: qty 100
  Filled 2019-09-08: qty 50
  Filled 2019-09-08 (×3): qty 100
  Filled 2019-09-08: qty 50

## 2019-09-08 MED ORDER — DIAZEPAM 5 MG PO TABS
10.0000 mg | ORAL_TABLET | Freq: Four times a day (QID) | ORAL | Status: DC
  Administered 2019-09-08 – 2019-09-09 (×4): 10 mg
  Filled 2019-09-08 (×4): qty 2

## 2019-09-08 MED ORDER — VECURONIUM BOLUS VIA INFUSION
10.0000 mg | Freq: Once | INTRAVENOUS | Status: DC
  Filled 2019-09-08: qty 10

## 2019-09-08 MED ORDER — INSULIN ASPART 100 UNIT/ML ~~LOC~~ SOLN
0.0000 [IU] | SUBCUTANEOUS | Status: DC
  Administered 2019-09-08 (×2): 7 [IU] via SUBCUTANEOUS
  Administered 2019-09-09 (×2): 4 [IU] via SUBCUTANEOUS
  Administered 2019-09-09: 11 [IU] via SUBCUTANEOUS
  Administered 2019-09-09: 4 [IU] via SUBCUTANEOUS
  Administered 2019-09-09 – 2019-09-11 (×7): 3 [IU] via SUBCUTANEOUS
  Administered 2019-09-12: 4 [IU] via SUBCUTANEOUS
  Administered 2019-09-12 – 2019-09-18 (×19): 3 [IU] via SUBCUTANEOUS
  Administered 2019-09-18 – 2019-09-19 (×3): 4 [IU] via SUBCUTANEOUS
  Administered 2019-09-19: 3 [IU] via SUBCUTANEOUS
  Administered 2019-09-19 (×2): 4 [IU] via SUBCUTANEOUS
  Administered 2019-09-20: 20:00:00 7 [IU] via SUBCUTANEOUS
  Administered 2019-09-20: 11:00:00 4 [IU] via SUBCUTANEOUS
  Administered 2019-09-20 (×2): 7 [IU] via SUBCUTANEOUS
  Administered 2019-09-20: 4 [IU] via SUBCUTANEOUS
  Administered 2019-09-20: 3 [IU] via SUBCUTANEOUS
  Administered 2019-09-21: 23:00:00 7 [IU] via SUBCUTANEOUS
  Administered 2019-09-21: 13:00:00 4 [IU] via SUBCUTANEOUS
  Administered 2019-09-21: 3 [IU] via SUBCUTANEOUS
  Administered 2019-09-21: 19:00:00 15 [IU] via SUBCUTANEOUS
  Administered 2019-09-21: 7 [IU] via SUBCUTANEOUS
  Administered 2019-09-21: 4 [IU] via SUBCUTANEOUS
  Administered 2019-09-22: 11 [IU] via SUBCUTANEOUS
  Administered 2019-09-22: 4 [IU] via SUBCUTANEOUS
  Administered 2019-09-22: 20:00:00 15 [IU] via SUBCUTANEOUS
  Administered 2019-09-22: 16:00:00 11 [IU] via SUBCUTANEOUS
  Administered 2019-09-22: 15 [IU] via SUBCUTANEOUS
  Administered 2019-09-22: 07:00:00 4 [IU] via SUBCUTANEOUS
  Administered 2019-09-23: 7 [IU] via SUBCUTANEOUS
  Administered 2019-09-23 (×2): 4 [IU] via SUBCUTANEOUS
  Administered 2019-09-23 (×2): 11 [IU] via SUBCUTANEOUS
  Administered 2019-09-23: 7 [IU] via SUBCUTANEOUS
  Administered 2019-09-24 (×2): 11 [IU] via SUBCUTANEOUS
  Administered 2019-09-24: 12:00:00 15 [IU] via SUBCUTANEOUS
  Administered 2019-09-24 – 2019-09-25 (×4): 11 [IU] via SUBCUTANEOUS
  Administered 2019-09-25: 4 [IU] via SUBCUTANEOUS
  Administered 2019-09-25: 15:00:00 7 [IU] via SUBCUTANEOUS
  Administered 2019-09-25: 11 [IU] via SUBCUTANEOUS
  Administered 2019-09-25 (×2): 7 [IU] via SUBCUTANEOUS
  Administered 2019-09-26: 12 [IU] via SUBCUTANEOUS
  Administered 2019-09-26 (×4): 7 [IU] via SUBCUTANEOUS
  Administered 2019-09-27: 4 [IU] via SUBCUTANEOUS
  Administered 2019-09-27 (×2): 3 [IU] via SUBCUTANEOUS
  Administered 2019-09-27 (×3): 4 [IU] via SUBCUTANEOUS
  Administered 2019-09-28: 3 [IU] via SUBCUTANEOUS
  Administered 2019-09-28 (×3): 4 [IU] via SUBCUTANEOUS
  Administered 2019-09-29: 17:00:00 3 [IU] via SUBCUTANEOUS
  Administered 2019-09-29: 12:00:00 4 [IU] via SUBCUTANEOUS
  Administered 2019-09-29 – 2019-10-01 (×4): 3 [IU] via SUBCUTANEOUS
  Administered 2019-10-02: 08:00:00 4 [IU] via SUBCUTANEOUS
  Administered 2019-10-02: 20:00:00 3 [IU] via SUBCUTANEOUS
  Administered 2019-10-03: 4 [IU] via SUBCUTANEOUS
  Administered 2019-10-04: 3 [IU] via SUBCUTANEOUS
  Administered 2019-10-04: 4 [IU] via SUBCUTANEOUS
  Administered 2019-10-04 (×2): 3 [IU] via SUBCUTANEOUS
  Administered 2019-10-05 – 2019-10-06 (×2): 4 [IU] via SUBCUTANEOUS
  Administered 2019-10-07: 3 [IU] via SUBCUTANEOUS
  Administered 2019-10-07: 4 [IU] via SUBCUTANEOUS
  Administered 2019-10-09: 3 [IU] via SUBCUTANEOUS
  Administered 2019-10-11: 4 [IU] via SUBCUTANEOUS
  Administered 2019-10-11 – 2019-10-21 (×15): 3 [IU] via SUBCUTANEOUS
  Administered 2019-10-22 – 2019-10-23 (×3): 4 [IU] via SUBCUTANEOUS
  Administered 2019-10-24: 3 [IU] via SUBCUTANEOUS

## 2019-09-08 MED ORDER — INSULIN ASPART 100 UNIT/ML ~~LOC~~ SOLN
0.0000 [IU] | SUBCUTANEOUS | Status: DC
  Administered 2019-09-08 (×2): 3 [IU] via SUBCUTANEOUS

## 2019-09-08 MED ORDER — INSULIN DETEMIR 100 UNIT/ML ~~LOC~~ SOLN
20.0000 [IU] | Freq: Two times a day (BID) | SUBCUTANEOUS | Status: DC
  Administered 2019-09-08 – 2019-09-11 (×7): 20 [IU] via SUBCUTANEOUS
  Filled 2019-09-08 (×10): qty 0.2

## 2019-09-08 MED ORDER — FREE WATER
300.0000 mL | Freq: Three times a day (TID) | Status: DC
  Administered 2019-09-08 – 2019-09-14 (×11): 300 mL

## 2019-09-08 MED ORDER — ROCURONIUM BROMIDE 50 MG/5ML IV SOLN
50.0000 mg | Freq: Once | INTRAVENOUS | Status: AC
  Administered 2019-09-08: 50 mg via INTRAVENOUS

## 2019-09-08 NOTE — Progress Notes (Signed)
Pharmacy Antibiotic Note  [name] is a 44 y.o. male admitted on 07/22/2019 with recurrent nosebleed on IV heparin for ECMO.  Pharmacy has been consulted for cefazolin dosing due to nasal packing.  Plan: Cefazolin 2g IV q 8 hrs. Could we consider d/c cefazolin since packing now removed?  Height: _0  (172.7 cm) Weight: (!) 329 lb 12.9 oz (149.6 kg) IBW/kg (Calculated) : 68.4  Temp (24hrs), Avg:98 F (36.7 C), Min:97.9 F (36.6 C), Max:98.2 F (36.8 C)  Recent Labs  Lab 09/04/19 0416 09/04/19 1617 09/05/19 0401 09/05/19 0402 09/05/19 1612 09/06/19 0405 09/06/19 1250 09/06/19 1855 09/06/19 1911 09/07/19 0433 09/07/19 1111 09/07/19 1545 09/08/19 0245  WBC 12.3*   < >  --  13.0*   < > 14.1*   < > 11.2*  --  12.4* 10.1 10.1 10.8*  CREATININE 0.75   < >  --  0.68  --  0.84  --   --  0.94 0.99  --   --  1.19  LATICACIDVEN 1.1  --  1.1  --   --  1.3  --   --   --  1.2  --   --  1.3   < > = values in this interval not displayed.    Estimated Creatinine Clearance: 111.9 mL/min (by C-G formula based on SCr of 1.19 mg/dL).    No Known Allergies  Remdesivir 12/14>>12/23 Actemra x1 12/16; 12/22 Plasma 12/17 Vanco 12/26 >>12/28, 1/4>>1/13 Cefepime 12/26 >>12/30, 1/4>>1/13 Ctx 1/15>> (nose packing)1/28 Ancef nasal packing 1/28>  12/14 BCx: ngF 12/14 sputum: Normal respiratory flora  12/25 MRSA PCR neg  12/28 trach: few MSSA 12/28 BCx: ngF 1/2 bronch: >100 k serratia marcescens (R cefazolin), >100 k staph epidermidis (S vanc, tetra) 1/4 BAL: MSSA, serratia marcescens (R cefazolin) 1/9 sputum: serratia/staph 1/9 bldx2 -ng 1/11 BAL: serratia 1/12 BAL: serratia 1/18 BAL: serratia - S cefepime 1/24 BCx x 2: neg  Thank you for allowing pharmacy to be a part of this patient's care.  [name], [facility] Clinical Pharmacist Phone [phone]  09/08/2019 7:41 AM

## 2019-09-08 NOTE — Progress Notes (Signed)
ANTICOAGULATION CONSULT NOTE - Follow Up Consult  Pharmacy Consult for Heparin Indication: ECMO  Patient Measurements: Height: 5' 8"  (172.7 cm) Weight: (!) 328 lb 7.8 oz (149 kg) IBW/kg (Calculated) : 68.4 Heparin Dosing Weight: 104.5kg  Vital Signs: Temp: 97.5 F (36.4 C) (01/21 1600) Temp Source: Axillary (01/21 1600) Pulse Rate: 87 (01/21 1700)  Labs: Recent Labs    09/06/19 1911 09/06/19 1950 09/07/19 0433 09/07/19 0443 09/07/19 1111 09/07/19 1111 09/07/19 1545 09/07/19 1554 09/07/19 1959 09/07/19 1959 09/08/19 0245 09/08/19 0253  HGB  --    < > 8.9*   < > 8.2*   < > 8.6*   < > 9.5*   < > 8.6* 8.8*  HCT  --    < > 30.3*   < > 27.7*   < > 29.0*   < > 28.0*  --  29.0* 26.0*  PLT  --    < > 141*   < > 122*  --  129*  --   --   --  128*  --   APTT  --    < > 90*  --   --   --  118*  --   --   --  113*  --   HEPARINUNFRC  --    < > 0.32  --   --   --  0.39  --   --   --   --  0.37  CREATININE 0.94  --  0.99  --   --   --   --   --   --   --  1.19  --    < > = values in this interval not displayed.    Assessment: 44yo male on VV ECMO on heparin infusion.  -Heparin level therapeutic at 0.39 on heparin drip 2550 units/hr. Nosebleed appears stable - packing removed 1/29 s/p TXA spray applied.  H/H stable tonight post PRBC earlier   LDH trending up - watch  1/31 AM update:  Heparin level remains therapeutic   Goal of Therapy:  Heparin level 0.3-0.5 units/ml   Plan:  Continue IV heparin at 2550 units/hr. Monitor heparin level and CBC 5AM/5PM, daily LDH Monitor for any increase in bleeding from mouth/nose   Narda Bonds, PharmD, [place] Pharmacist Phone: [phone]

## 2019-09-08 NOTE — Progress Notes (Addendum)
Patient ID: [name], male   DOB: 1975-12-13, 44 y.o.   MRN: [id number]    Advanced Heart Failure Rounding Note   Subjective:    Circuit changed and trach exchanged on 1/8.   Underwent addition of a RIJ drainage cannula on 1/20 Circuit changed on 1/22 for rising LDH Had evidence of recirculation and RIJ cannula pulled back on 1/26, removed on 1/29.   Remains on VV ecmo with good flows. Oxygen saturation 93% this morning, Vt lower today at 230.  CXR with increased airspace disease bilaterally. Afebrile.    I/Os 5L+ for the day.  Lasix stopped during the day with fall in MAP and increased negative pressure/lower flows.  Patient received 1 unit PRBCs + multiple runs of 5% albumin.  Weight is up significantly. He is back on Lasix gtt at 4 mg/hr this morning.  NE was up to 34, now down to 9. We also noted that he has been getting 0.3 tid clonidine and tid propranolol, both of which were stopped this morning.   ABG 7.34/50/59/89%  Co-ox 74%. Sweep 15.    On vent now at 80% PEEP 15. PlatP 30, Bi-vent setting.TVs 230 cc Personally reviewed  hgb 8.6 LDH 480 -> 445 -> 432-> 450 -> 481 -> 503 -> 583 -> 509.  Lactate 1.3  Drip: Heparin Norepinephrine 9 amio 30 Versed Insulin Dilaudid Precedex Lasix 4  ECMO parameters Flow 4.0 L/min RPM 3725 deltaP 33 Pressure -100  Objective:   Weight Range:  Vital Signs:   Temp:  [97.9 F (36.6 C)-98.2 F (36.8 C)] 98 F (36.7 C) (01/31 0400) Pulse Rate:  [64-111] 71 (01/31 0700) Resp:  [13-29] 15 (01/31 0700) BP: (114-150)/(50-55) 124/53 (01/31 0413) SpO2:  [87 %-95 %] 93 % (01/31 0700) Arterial Line BP: (95-156)/(37-63) 110/44 (01/31 0700) FiO2 (%):  [80 %] 80 % (01/31 0413) Weight:  [149.6 kg] 149.6 kg (01/31 0500) Last BM Date: 09/07/19  Weight change: Filed Weights   09/06/19 0500 09/07/19 0500 09/08/19 0500  Weight: (!) 144.1 kg (!) 143.9 kg (!) 149.6 kg    Intake/Output:   Intake/Output Summary (Last 24 hours) at  09/08/2019 0742 Last data filed at 09/08/2019 0700 Gross per 24 hour  Intake 7630.64 ml  Output 2145 ml  Net 5485.64 ml     Physical Exam: General: Sedated on vent Neck: Thick, JVP difficult, tracheostomy present  Lungs: Decreased dependently CV: Nondisplaced PMI.  Heart regular S1/S2, no S3/S4, no murmur.  2+ edema to thighs.   Abdomen: Soft, no hepatosplenomegaly, mild distention.  Skin: Intact without lesions or rashes.  Neurologic: Sedated on vent Extremities: No clubbing or cyanosis.  HEENT: Normal.   Telemetry: Sinus tach 100-110s  Personally reviewed  Labs: Basic Metabolic Panel: Recent Labs  Lab 09/05/19 0402 09/05/19 0420 09/06/19 0405 09/06/19 0412 09/06/19 1911 09/06/19 1955 09/07/19 8295 09/07/19 0443 09/07/19 0742 09/07/19 1554 09/07/19 1959 09/08/19 0245 09/08/19 0253  NA 148*   < > 149*   < > 150*   < > 151*   < > 151* 150* 151* 152* 151*  K 3.8   < > 3.3*   < > 3.9   < > 4.2   < > 4.1 3.6 3.9 3.7 3.7  CL 106  --  104  --  110  --  107  --   --   --   --  110  --   CO2 30  --  30  --  28  --  30  --   --   --   --  27  --   GLUCOSE 168*  --  214*  --  204*  --  163*  --   --   --   --  208*  --   BUN 57*  --  71*  --  79*  --  81*  --   --   --   --  98*  --   CREATININE 0.68  --  0.84  --  0.94  --  0.99  --   --   --   --  1.19  --   CALCIUM 9.0   < > 9.2   < > 9.1  --  9.4  --   --   --   --  9.4  --   MG  --   --   --   --   --   --  2.8*  --   --   --   --   --   --    < > = values in this interval not displayed.    Liver Function Tests: No results for input(s): AST, ALT, ALKPHOS, BILITOT, PROT, ALBUMIN in the last 168 hours. No results for input(s): LIPASE, AMYLASE in the last 168 hours. No results for input(s): AMMONIA in the last 168 hours.  CBC: Recent Labs  Lab 09/06/19 1855 09/06/19 1955 09/07/19 0433 09/07/19 0443 09/07/19 1111 09/07/19 1111 09/07/19 1545 09/07/19 1554 09/07/19 1959 09/08/19 0245 09/08/19 0253  WBC 11.2*   --  12.4*  --  10.1  --  10.1  --   --  10.8*  --   HGB 8.7*   < > 8.9*   < > 8.2*   < > 8.6* 9.9* 9.5* 8.6* 8.8*  HCT 29.6*   < > 30.3*   < > 27.7*   < > 29.0* 29.0* 28.0* 29.0* 26.0*  MCV 94.9  --  96.5  --  95.8  --  94.8  --   --  94.5  --   PLT 136*  --  141*  --  122*  --  129*  --   --  128*  --    < > = values in this interval not displayed.    Cardiac Enzymes: No results for input(s): CKTOTAL, CKMB, CKMBINDEX, TROPONINI in the last 168 hours.  BNP: BNP (last 3 results) Recent Labs    07/22/19 1039  BNP 15.3    ProBNP (last 3 results) No results for input(s): PROBNP in the last 8760 hours.    Other results:  Imaging: DG Chest Port 1 View  Result Date: 09/08/2019 CLINICAL DATA:  Acute respiratory failure. EXAM: PORTABLE CHEST 1 VIEW COMPARISON:  Chest radiograph 09/07/2019 FINDINGS: ET tube mid trachea. Right upper extremity PICC line tip projects over the superior vena cava. Enteric tube courses inferior to the diaphragm. Left subclavian central venous catheter tip projects over the central left brachiocephalic vein. Bilateral chest tubes in place. Inferior vena cava cannula tip terminates at the level of the lower right atrium, unchanged. Obscured cardiac and mediastinal contours. Mild interval worsening of diffuse bilateral airspace opacities particularly within the lower lungs bilaterally. IMPRESSION: Mild interval worsening of the diffuse bilateral airspace opacities, particularly within the lower lungs bilaterally. Electronically Signed   By: Lovey Newcomer M.D.   On: 09/08/2019 06:56   DG CHEST PORT 1 VIEW  Result Date: 09/07/2019 CLINICAL DATA:  Respiratory failure. EXAM:  PORTABLE CHEST 1 VIEW COMPARISON:  Chest radiograph 09/07/2019 FINDINGS: ET tube terminates in the mid trachea. Enteric tube courses inferior to the diaphragm. Inferior vena cava cannula tip terminates at the level of the lower right atrium. Bilateral chest tubes in place. Left subclavian central venous  catheter tip projects over the central left brachiocephalic vein. Right upper extremity PICC line tip projects over the superior vena cava. Stable cardiac and mediastinal contours. Low lung volumes. Similar-appearing diffuse bilateral airspace opacities. IMPRESSION: Support apparatus as above. Redemonstrated bilateral chest tubes without definite pneumothorax. Redemonstrated extensive bilateral airspace opacities. Electronically Signed   By: Lovey Newcomer M.D.   On: 09/07/2019 11:15   DG CHEST PORT 1 VIEW  Result Date: 09/07/2019 CLINICAL DATA:  Initial evaluation for cardio respiratory problem. EXAM: PORTABLE CHEST 1 VIEW COMPARISON:  Prior radiograph from 09/06/2019. FINDINGS: Right IJ ECMO has been removed. IVC cannula tip terminates at the level of the lower right atrium. Endotracheal tube positioned 3.9 cm above the carina. Feeding tube courses into the abdomen, nonvisualization of the tip. Left subclavian central venous catheter overlies the proximal-mid SVC, stable. Right PICC catheter overlies the distal SVC, unchanged. Cardiomediastinal silhouette obscured, but grossly stable. Lungs hypoinflated. Bilateral chest tubes remain in place, stable. No visible pneumothorax. No obvious large pleural effusion. Extensive bilateral airspace opacity, little interval changed. IMPRESSION: 1. Interval removal of right IJ ECMO cannula. 2. Remaining support apparatus in place as above. Bilateral chest tubes in stable position without appreciable pneumothorax. 3. Extensive bilateral airspace opacities, little interval changed from previous. Electronically Signed   By: Jeannine Boga M.D.   On: 09/07/2019 07:04     Medications:     Scheduled Medications: . sodium chloride   Intravenous Once  . amiodarone  150 mg Intravenous Once  . vitamin C  500 mg Per Tube Daily  . chlorhexidine gluconate (MEDLINE KIT)  15 mL Mouth Rinse BID  . Chlorhexidine Gluconate Cloth  6 each Topical Daily  . cloNIDine  0.3 mg Per  Tube BID  . diazepam  20 mg Per Tube Q6H  . feeding supplement (PRO-STAT SUGAR FREE 64)  60 mL Per Tube BID  . free water  200 mL Per Tube Q8H  . gabapentin  450 mg Per Tube Q8H  . HYDROmorphone  12 mg Per Tube Q4H  . insulin detemir  10 Units Subcutaneous BID  . ipratropium-albuterol  3 mL Nebulization Q4H  . mouth rinse  15 mL Mouth Rinse 10 times per day  . montelukast  10 mg Per Tube QHS  . oxymetazoline  1 spray Each Nare BID  . pantoprazole sodium  40 mg Per Tube QHS  . propranolol  20 mg Per Tube TID  . QUEtiapine  50 mg Per Tube BID  . sodium chloride flush  10-40 mL Intracatheter Q12H  . sodium chloride flush  10-40 mL Intracatheter Q12H  . Thrombi-Pad  1 each Topical Once  . Thrombi-Pad  1 each Topical Once  . vecuronium  10 mg Intravenous Once  . zinc sulfate  220 mg Per Tube Daily    Infusions: . sodium chloride    . sodium chloride Stopped (09/05/19 1900)  . sodium chloride Stopped (09/07/19 1339)  . albumin human    . amiodarone 30 mg/hr (09/08/19 0700)  .  ceFAZolin (ANCEF) IV Stopped (09/08/19 0617)  . dexmedetomidine (PRECEDEX) IV infusion 1.5 mcg/kg/hr (09/08/19 0700)  . feeding supplement (PIVOT 1.5 CAL) 1,000 mL (09/07/19 2049)  . furosemide (LASIX) infusion 4  mg/hr (09/08/19 0700)  . heparin 2,550 Units/hr (09/08/19 0700)  . HYDROmorphone 7 mg/hr (09/08/19 0700)  . insulin 4.2 mL/hr at 09/08/19 0700  . midazolam 4 mg/hr (09/08/19 0700)  . norepinephrine Stopped (09/07/19 1539)  . propofol (DIPRIVAN) infusion Stopped (08/31/19 0910)  . vasopressin (PITRESSIN) infusion - *FOR SHOCK* 0.03 Units/min (09/07/19 1033)    PRN Medications: Place/Maintain arterial line **AND** sodium chloride, sodium chloride, sodium chloride, acetaminophen (TYLENOL) oral liquid 160 mg/5 mL, acetaminophen, albumin human, dextrose, hydrALAZINE, HYDROmorphone, magnesium hydroxide, midazolam, ondansetron (ZOFRAN) IV, senna-docusate, sodium chloride, sodium chloride flush, sodium  chloride flush   Assessment/Plan:    1. Acute hypoxic/hypercarbic respiratory failure due to COVID PNA: Remained markedly hypoxic despite full support. Intubated 12/26. S/p bilateral CTs 12/26 for pneumothoraces.  TEE on 12/26 LVEF 70% RV ok. VV ECMO begun 12/26. Tracheostomy 12/29.  COVID ARDS at this point. ECMO circuit changed 1/8 & 1/23. SVC cannula removed 1/29.  This morning, oxygenation is not as good with lower tidal volume, CXR with increased bilateral airspace disease, weight up considerably (5 L positive).  Think excess fluid over the last day is major problem.  Lasix was stopped for most of day due to low flow and hypotension (now on norepinephrine at 9).  - Continue current ECMO settings.  - Massively volume overloaded. Increase Lasix gtt to 8 mg/hr.  If tolerated, can increase further from there if I/Os not negative. Rising BUN is an issue.  May end up needing CVVH if unable to manage volume.   - If MAP/flow fall, use 25% albumin rather than 5% to decrease volume and try to continue Lasix + give albumin rather than stopping. Part of the problem with hypotension yesterday is likely clonidine 0.3 tid and propranolol tid that he continued to receive, these have been stopped.  - Bronchoscopy today per Dr. Valeta Harms for ?mucus plugging.  - Epistaxis resolved. Continue packing and Ancef - LDH 480 -> 412 -> 445 -> 432 -> 450 -> 481 -> 503 -> 580 -> 509.   - Continue heparin, level acceptable.  - wean sedation as tolerated.  - Continue chest PT  2. Bilateral PTX: Due to barotrauma.  - Driving pressure minimized at < 15 Stable. No change  3. COVID PNA: management of COVID infection per CCM. CXR with bilateral multifocal PNA progressing to likely ARDS.  - s/p 10 days of remdesivir - 12/16, 12/2 s/p tociluzimab - 12/17 s/p convalescent plasma - Decadron at 11m/daily completed 1/11 - Completed cefepime/vanc - On Ancef for nasal packing.  4. ID: Empiric coverage with vancomycin/cefepime  initially for possible secondary bacterial PNA, course completed.   - treated with cefepime and vanc for + cx with staph epidimidis, MSSA and serratia (sensitivities ok). - On cefazolin for nasal packing.   5. Thrombocytopenia: Stable, likely low level hemolysis + critical illness.   6. Anemia: Bleeding at trach site resolved but still with epistaxis - hgb 8.6  today - Transfuse hgb < 8  7. FEN - continue TFs via oral Cor-trak  8.Dilated R pupil - head CT 1/28 no acute  9. Hypernatremia - NA 152. Increased free water to 300 cc q6 hrs.   D/w with ECMO team (ECMO coordinator/specialists, CCM, TCTS and pharmD) at bedside on multidisciplinary rounds. Continue to await ARDS recovery. Details as above.   Length of Stay: 470 CRITICAL CARE Performed by: DLoralie Champagne Total critical care time: 40 minutes  Critical care time was exclusive of separately billable procedures and treating  other patients.  Critical care was necessary to treat or prevent imminent or life-threatening deterioration.  Critical care was time spent personally by me (independent of midlevel providers or residents) on the following activities: development of treatment plan with patient and/or surrogate as well as nursing, discussions with consultants, evaluation of patient's response to treatment, examination of patient, obtaining history from patient or surrogate, ordering and performing treatments and interventions, ordering and review of laboratory studies, ordering and review of radiographic studies, pulse oximetry and re-evaluation of patient's condition.    Loralie Champagne MD 09/08/2019, 7:42 AM  Advanced Heart Failure Team Pager [phone] (M-F; 7a - 4p)  Please contact [facility] for night-coverage after hours (4p -7a ) and weekends on [email or web address]

## 2019-09-08 NOTE — Progress Notes (Addendum)
PCCM:  Evening rounds. Patient seen an evaluated. Care plan discuss Still positive on CFB, +1100cc up for the day  Na+150 FW was increased earlier today   Patient hypertensive receiving prn hydralazine We will increased lasix to 47m/hr  Metolazone 2.55mX1   Repeat ABG looks stable. TV 280s, Vm 5L  PH stable, 7.34 Decrease sweep gas to 14L   Repeat ABG in 2 hours  Hopefully we will start to see some inate increase in Vm  His TV were in 300s yesterday for brief period  Would like to see him with negative daily fluid balance for today we are close.   Case discussed with pharmacy and ECMO team at bedside.   This patient is critically ill with multiple organ system failure; which, requires frequent high complexity decision making, assessment, support, evaluation, and titration of therapies. This was completed through the application of advanced monitoring technologies and extensive interpretation of multiple databases. During this encounter critical care time was devoted to patient care services described in this note for 33 minutes.  [name] NashDO [facility] 09/08/2019 6:27 PM

## 2019-09-08 NOTE — Progress Notes (Signed)
NAME:  [name], MRN:  [id number], DOB:  [birth year], LOS: 67 ADMISSION DATE:  07/22/2019, CONSULTATION DATE:  12/24 REFERRING MD:  Sloan Leiter, CHIEF COMPLAINT:  Dyspnea   Brief History   44 y/o M admitted 12/14 with COVID pneumonia causing acute hypoxemic respiratory failure. Developed pneumomediastinum 12/23 with concern for possible bilateral small pneumothoraces on 12/24.  PCCM consulted for evaluation of pneumothoraces  Past Medical History  GERD HTN Asthma  Significant Hospital Events   12/14 admit with hypoxemic respiratory failure in setting of COVID-19 pneumonia 12/23 noted to have pneumomediastinum on chest x-ray 12/24 PCCM consulted for evaluation 12/26 worsening hypoxemia, intubated  12/27 VV fem/fem ECMO cannulation See bedside nursing event notes for full rundown of procedures after 12/27 1/30 improved cxr, improved lung compliance, TV 350s on 15PC  1/31 CXR increased infiltrates, +CFB      Consults:  PCCM, CTS, CHF, Palliative  Procedures:  See bedside nursing event notes for full rundown of procedures.  Significant Diagnostic Tests:   CT chest 12/22 >> extensive pneumomediastinum, multifocal patchy bilateral groundglass opacities CXR 1/25 >> improving bilateral infiltrates. ECMO cannulae in place.  Micro Data:  BCx2 12/14 >> negative  Sputum 12/15 >> negative  1/9 bronch: serratia and MSSA 1/2 acid fast smear bronch: negative 1/2 fungal cx bronch: pending 1/12 BAL: serratia 1/18 BAL >> rare Serratia.  Antimicrobials:  Received remdesivir, steroids, actemra and convalescent plasma  5 days cefepime 12/24-12/29 Cefepime 1/4->1/14 vanc 1/4->1/14 Ceftriaxone 1/14 >>   Interim history/subjective:   Critically ill. Sedated on vent. vvECMO in place. Case discussed with team at bedside.   Objective   Blood pressure (!) 153/56, pulse 79, temperature 98 F (36.7 C), temperature source Oral, resp. rate 15, height _0  (1.727 m), weight (!) 149.6 kg,  SpO2 (!) 84 %. CVP:  [12 mmHg-14 mmHg] 13 mmHg  Vent Mode: PCV FiO2 (%):  [80 %-100 %] 100 % Set Rate:  [15 bmp] 15 bmp PEEP:  [15 cmH20] 15 cmH20 Plateau Pressure:  [31 cmH20-35 cmH20] 31 cmH20   Intake/Output Summary (Last 24 hours) at 09/08/2019 3818 Last data filed at 09/08/2019 0800 Gross per 24 hour  Intake 7045 ml  Output 2060 ml  Net 4985 ml   Filed Weights   09/06/19 0500 09/07/19 0500 09/08/19 0500  Weight: (!) 144.1 kg (!) 143.9 kg (!) 149.6 kg   Examination General appearance: 44 y.o., male, trach in place, rev trendel, Eyes: no response this morning, heavy sedated  HENT: MMM Neck: trach in place  Lungs: BL vented breaths  Chest: BL chest tubes, tidaling  CV: RRR, s1 s2, no MRG  Abdomen: soft, nt, nd Extremities: diffuse anasarca  Skin: anterior chest folliculitis Neuro: sedated on dialysis   CXR - increased BL infiltrates in comparison to yesterday. The patient's images have been independently reviewed by me.    CT head: pansinusitis. CT chest: Dense bilateral consolidation in dependent lungs both sides CT abdomen: Cannulae in position in IVC.  Flow (LPM): 4.16 ECMO Mode: VV ECMO Device: Cardiohelp  $ Ventilator Initial/Subsequent : Subsequent, Vent Mode: PCV, Set Rate: 15 bmp, FiO2 (%): 100 % (increased for bronchonscopy procedure), I Time: 0.8 Sec(s), PEEP: 15 cmH20 Vt today 350 ml  Net IO Since Admission: 42,978.35 mL [09/08/19 0807]  Resolved Hospital Problem list   MSSA and serratia marcescens pneumonia   Assessment & Plan:   Acute hypoxemic respiratory failure with ARDS secondary to COVID-19, status post tracheostomy on mechanical ventilation and VV ECMO support.  -  slowly resolving DAD - up 5L since yesterday due to hypotension yesterday, increased pressors and albumin infusion   - TV dropped since yesterday, 280s, I suspect component of pulmonary edema  - Flows on ecmo stable  - still titrating pressors to maintain MAP goals  - ABG  reviewed, CO2 improved, repeat this afternoon and possible drop in sweep later today, currently at 15L - Bronch today for plugging, please see separate note - case discussed with ECMO team  - we will restart lasix ggt - changed 5% albumin to 25% PRN dosing   Positive CFB - working to decrease drip rates - switching to orals when possible - discussed with nursing  Hypotension - all scheduled BP meds discontinue  Posterior epistaxis. Continue nose/nare care Can use oxymetazoline/txa again if needed   Hypernatremia - FW 231m  Daily Goals Checklist   Pain/Anxiety/Delirium protocol (if indicated): Versed plus Dilaudid plus Precedex plus Seroquel VAP protocol (if indicated): Bundle in place. Respiratory support goals: Pressure control ventilation with ultralow protective lung ventilation Blood pressure target:  MAP>65, off pressors ECMO SVC cannula removed 09/06/2019 DVT prophylaxis: Remains on systemic heparin Nutrition Status: Nutrition Problem: Increased nutrient needs Etiology: acute illness(COVID PNA) Signs/Symptoms: estimated needs Interventions: Tube feeding, Prostat GI prophylaxis: Protonix per tube. Fluid status goals: Continue furosemide infusion for approximately 1.5 L net negative per day Urinary catheter: Guide hemodynamic management Glucose control: Euglycemic control with SSI Mobility/therapy needs: Bedrest.  Daily labs: as per ECMO protocol. Code Status: Full code  Family Communication:family updated by RN. Disposition: ICU  Labs   CBC: CBC Latest Ref Rng & Units 09/08/2019 09/08/2019 09/07/2019  WBC 4.0 - 10.5 K/uL - 10.8(H) -  Hemoglobin 13.0 - 17.0 g/dL 8.8(L) 8.6(L) 9.5(L)  Hematocrit 39.0 - 52.0 % 26.0(L) 29.0(L) 28.0(L)  Platelets 150 - 400 K/uL - 128(L) -    Basic Metabolic Panel: BMP Latest Ref Rng & Units 09/08/2019 09/08/2019 09/07/2019  Glucose 70 - 99 mg/dL - 208(H) -  BUN 6 - 20 mg/dL - 98(H) -  Creatinine 0.61 - 1.24 mg/dL - 1.19 -  Sodium  135 - 145 mmol/L 151(H) 152(H) 151(H)  Potassium 3.5 - 5.1 mmol/L 3.7 3.7 3.9  Chloride 98 - 111 mmol/L - 110 -  CO2 22 - 32 mmol/L - 27 -  Calcium 8.9 - 10.3 mg/dL - 9.4 -    GFR: Estimated Creatinine Clearance: 111.9 mL/min (by C-G formula based on SCr of 1.19 mg/dL). Recent Labs  Lab 09/05/19 0401 09/05/19 0402 09/06/19 0405 09/06/19 1250 09/07/19 0433 09/07/19 1111 09/07/19 1545 09/08/19 0245  WBC  --    < > 14.1*   < > 12.4* 10.1 10.1 10.8*  LATICACIDVEN 1.1  --  1.3  --  1.2  --   --  1.3   < > = values in this interval not displayed.    Liver Function Tests: No results for input(s): AST, ALT, ALKPHOS, BILITOT, PROT, ALBUMIN in the last 168 hours. No results for input(s): LIPASE, AMYLASE in the last 168 hours. No results for input(s): AMMONIA in the last 168 hours.  ABG    Component Value Date/Time   PHART 7.374 09/08/2019 0253   PCO2ART 49.9 (H) 09/08/2019 0253   PO2ART 59.0 (L) 09/08/2019 0253   HCO3 29.2 (H) 09/08/2019 0253   TCO2 31 09/08/2019 0253   ACIDBASEDEF 2.0 08/28/2019 1519   O2SAT 89.0 09/08/2019 0253     Coagulation Profile: No results for input(s): INR, PROTIME in the last 168 hours.  Cardiac Enzymes: No results for input(s): CKTOTAL, CKMB, CKMBINDEX, TROPONINI in the last 168 hours.  HbA1C: Hgb A1c MFr Bld  Date/Time Value Ref Range Status  07/24/2019 04:50 AM 6.7 (H) 4.8 - 5.6 % Final    Comment:    (NOTE) Pre diabetes:          5.7%-6.4% Diabetes:              >6.4% Glycemic control for   <7.0% adults with diabetes   09/30/2017 05:36 AM 5.7 (H) 4.8 - 5.6 % Final    Comment:    (NOTE)         Prediabetes: 5.7 - 6.4         Diabetes: >6.4         Glycemic control for adults with diabetes: <7.0     CBG: Recent Labs  Lab 09/08/19 0251 09/08/19 0402 09/08/19 0601 09/08/19 0648 09/08/19 0758  GLUCAP 193* 197* 152* 180* 158*    This patient is critically ill with multiple organ system failure; which, requires frequent high  complexity decision making, assessment, support, evaluation, and titration of therapies. This was completed through the application of advanced monitoring technologies and extensive interpretation of multiple databases. During this encounter critical care time was devoted to patient care services described in this note for 52 minutes.  Garner Nash, DO [facility] 09/08/2019 8:08 AM

## 2019-09-08 NOTE — Progress Notes (Signed)
ANTICOAGULATION CONSULT NOTE - Follow Up Consult  Pharmacy Consult for Heparin Indication: ECMO  Patient Measurements: Height: 5' 8"  (172.7 cm) Weight: (!) 328 lb 7.8 oz (149 kg) IBW/kg (Calculated) : 68.4 Heparin Dosing Weight: 104.5kg  Vital Signs: Temp: 97.5 F (36.4 C) (01/21 1600) Temp Source: Axillary (01/21 1600) Pulse Rate: 87 (01/21 1700)  Labs: Recent Labs    09/07/19 0433 09/07/19 0443 09/07/19 1545 09/07/19 1554 09/08/19 0245 09/08/19 0245 09/08/19 0253 09/08/19 0253 09/08/19 1137 09/08/19 1137 09/08/19 1631 09/08/19 1638  HGB 8.9*   < > 8.6*   < > 8.6*   < > 8.8*   < > 8.2*   < > 8.4* 8.8*  HCT 30.3*   < > 29.0*   < > 29.0*   < > 26.0*   < > 24.0*  --  28.5* 26.0*  PLT 141*   < > 129*  --  128*  --   --   --   --   --  123*  --   APTT 90*   < > 118*  --  113*  --   --   --   --   --  104*  --   HEPARINUNFRC 0.32   < > 0.39  --   --   --  0.37  --   --   --  0.39  --   CREATININE 0.99  --   --   --  1.19  --   --   --   --   --  1.19  --    < > = values in this interval not displayed.    Assessment: 44yo male on VV ECMO on heparin infusion.  -Heparin level therapeutic at 0.39 on heparin drip 2550 units/hr. Nosebleed appears stable - packing removed 1/29 s/p TXA spray applied.  H/H stable tonight post PRBC 1/30  LDH stable 500s - monitor Fibrinogen stable 600s  Heparin drip 2550 uts/hr HL 0.39 at goal   Goal of Therapy:  Heparin level 0.3-0.5 units/ml   Plan:  Continue IV heparin at 2550 units/hr. Monitor heparin level and CBC 5AM/5PM, daily LDH Monitor for any increase in bleeding from mouth/nose   Bonnita Nasuti Pharm.D. CPP, BCPS Clinical Pharmacist [phone] 09/08/2019 7:36 PM

## 2019-09-08 NOTE — Procedures (Signed)
Bronchoscopy Procedure Note ANTRELL TIPLER [id number] 1976/06/04  Procedure: Bronchoscopy Indications: Remove secretions  Procedure Details Consent: Risks of procedure as well as the alternatives and risks of each were explained to the (patient/caregiver).  Consent for procedure obtained. Routing standing order.  Time Out: Verified patient identification, verified procedure, site/side was marked, verified correct patient position, special equipment/implants available, medications/allergies/relevent history reviewed, required imaging and test results available.  Performed  In preparation for procedure, patient was given 100% FiO2 and bronchoscope lubricated. Sedation: Benzodiazepines and dilaudid, 13m rocuronium   Airway entered and the following bronchi were examined: RUL, RML, RLL, LUL, LLL and Bronchi.   Procedures performed: therapeutic suctioning and aspiration of the bilateral bronchial tree. There were thick, tan, gray, bloody secretions laying dependent to the posterior tracheal wall and left main stem. The distal RLL and LLL sub-segments occluded with mucus plugging. All ariways were clear at the completion of the procedure.   Along the posterior wall of the bronchus intermedius, small lesions were identifed, raised rounded granuloma appearing. Please see below image.   Bronchoscope removed.    Evaluation Hemodynamic Status: BP stable throughout; O2 sats: stable throughout Patient's Current Condition: stable Specimens:  None Complications: No apparent complications Patient did tolerate procedure well.  Bronch image:   Opening of RML at the 12 o'clock position. Image of distal posterior bronchus intermedius  3 o'clock position opening of RLL. Small round raised lesions, I suspect from suction trauma but will need future follow up.   BOctavio GravesIcard 09/08/2019

## 2019-09-08 NOTE — Progress Notes (Deleted)
Pharmacy Antibiotic Note  [name] is a 44 y.o. male admitted on 07/22/2019 with recurrent nosebleed on IV heparin for ECMO.  Pharmacy has been consulted for cefazolin dosing due to nasal packing.  Plan: Cefazolin 2g IV q 8 hrs. Could we consider d/c cefazolin since packing now removed?  Height: _0  (172.7 cm) Weight: (!) 329 lb 12.9 oz (149.6 kg) IBW/kg (Calculated) : 68.4  Temp (24hrs), Avg:97.9 F (36.6 C), Min:97.5 F (36.4 C), Max:98.2 F (36.8 C)  Recent Labs  Lab 09/04/19 0416 09/04/19 1617 09/05/19 0401 09/05/19 0402 09/06/19 0405 09/06/19 1250 09/06/19 1855 09/06/19 1911 09/07/19 0433 09/07/19 1111 09/07/19 1545 09/08/19 0245 09/08/19 1631  WBC 12.3*   < >  --    < > 14.1*   < >   < >  --  12.4* 10.1 10.1 10.8* 12.0*  CREATININE 0.75   < >  --    < > 0.84  --   --  0.94 0.99  --   --  1.19 1.19  LATICACIDVEN 1.1  --  1.1  --  1.3  --   --   --  1.2  --   --  1.3  --    < > = values in this interval not displayed.    Estimated Creatinine Clearance: 111.9 mL/min (by C-G formula based on SCr of 1.19 mg/dL).    No Known Allergies  Remdesivir 12/14>>12/23 Actemra x1 12/16; 12/22 Plasma 12/17 Vanco 12/26 >>12/28, 1/4>>1/13 Cefepime 12/26 >>12/30, 1/4>>1/13 Ctx 1/15>> (nose packing)1/28 Ancef nasal packing 1/28>  12/14 BCx: ngF 12/14 sputum: Normal respiratory flora  12/25 MRSA PCR neg  12/28 trach: few MSSA 12/28 BCx: ngF 1/2 bronch: >100 k serratia marcescens (R cefazolin), >100 k staph epidermidis (S vanc, tetra) 1/4 BAL: MSSA, serratia marcescens (R cefazolin) 1/9 sputum: serratia/staph 1/9 bldx2 -ng 1/11 BAL: serratia 1/12 BAL: serratia 1/18 BAL: serratia - S cefepime 1/24 BCx x 2: neg  Thank you for allowing pharmacy to be a part of this patient's care.  [name], [facility] Clinical Pharmacist Phone [phone]  09/08/2019 7:35 PM

## 2019-09-09 ENCOUNTER — Inpatient Hospital Stay (HOSPITAL_COMMUNITY): Payer: Managed Care, Other (non HMO)

## 2019-09-09 LAB — POCT I-STAT 7, (LYTES, BLD GAS, ICA,H+H)
Acid-Base Excess: 1 mmol/L (ref 0.0–2.0)
Acid-Base Excess: 1 mmol/L (ref 0.0–2.0)
Acid-Base Excess: 2 mmol/L (ref 0.0–2.0)
Acid-Base Excess: 3 mmol/L — ABNORMAL HIGH (ref 0.0–2.0)
Acid-Base Excess: 3 mmol/L — ABNORMAL HIGH (ref 0.0–2.0)
Acid-Base Excess: 3 mmol/L — ABNORMAL HIGH (ref 0.0–2.0)
Acid-Base Excess: 4 mmol/L — ABNORMAL HIGH (ref 0.0–2.0)
Bicarbonate: 28.2 mmol/L — ABNORMAL HIGH (ref 20.0–28.0)
Bicarbonate: 28.4 mmol/L — ABNORMAL HIGH (ref 20.0–28.0)
Bicarbonate: 28.8 mmol/L — ABNORMAL HIGH (ref 20.0–28.0)
Bicarbonate: 29.1 mmol/L — ABNORMAL HIGH (ref 20.0–28.0)
Bicarbonate: 29.2 mmol/L — ABNORMAL HIGH (ref 20.0–28.0)
Bicarbonate: 29.8 mmol/L — ABNORMAL HIGH (ref 20.0–28.0)
Bicarbonate: 30 mmol/L — ABNORMAL HIGH (ref 20.0–28.0)
Calcium, Ion: 1.3 mmol/L (ref 1.15–1.40)
Calcium, Ion: 1.31 mmol/L (ref 1.15–1.40)
Calcium, Ion: 1.31 mmol/L (ref 1.15–1.40)
Calcium, Ion: 1.32 mmol/L (ref 1.15–1.40)
Calcium, Ion: 1.32 mmol/L (ref 1.15–1.40)
Calcium, Ion: 1.32 mmol/L (ref 1.15–1.40)
Calcium, Ion: 1.34 mmol/L (ref 1.15–1.40)
HCT: 25 % — ABNORMAL LOW (ref 39.0–52.0)
HCT: 26 % — ABNORMAL LOW (ref 39.0–52.0)
HCT: 28 % — ABNORMAL LOW (ref 39.0–52.0)
HCT: 28 % — ABNORMAL LOW (ref 39.0–52.0)
HCT: 29 % — ABNORMAL LOW (ref 39.0–52.0)
HCT: 29 % — ABNORMAL LOW (ref 39.0–52.0)
HCT: 30 % — ABNORMAL LOW (ref 39.0–52.0)
Hemoglobin: 10.2 g/dL — ABNORMAL LOW (ref 13.0–17.0)
Hemoglobin: 8.5 g/dL — ABNORMAL LOW (ref 13.0–17.0)
Hemoglobin: 8.8 g/dL — ABNORMAL LOW (ref 13.0–17.0)
Hemoglobin: 9.5 g/dL — ABNORMAL LOW (ref 13.0–17.0)
Hemoglobin: 9.5 g/dL — ABNORMAL LOW (ref 13.0–17.0)
Hemoglobin: 9.9 g/dL — ABNORMAL LOW (ref 13.0–17.0)
Hemoglobin: 9.9 g/dL — ABNORMAL LOW (ref 13.0–17.0)
O2 Saturation: 84 %
O2 Saturation: 86 %
O2 Saturation: 87 %
O2 Saturation: 87 %
O2 Saturation: 87 %
O2 Saturation: 95 %
O2 Saturation: 96 %
Patient temperature: 36.4
Patient temperature: 36.5
Patient temperature: 36.5
Patient temperature: 36.6
Patient temperature: 36.6
Patient temperature: 98.1
Patient temperature: 98.2
Potassium: 3.8 mmol/L (ref 3.5–5.1)
Potassium: 4 mmol/L (ref 3.5–5.1)
Potassium: 4 mmol/L (ref 3.5–5.1)
Potassium: 4 mmol/L (ref 3.5–5.1)
Potassium: 4.1 mmol/L (ref 3.5–5.1)
Potassium: 4.3 mmol/L (ref 3.5–5.1)
Potassium: 4.4 mmol/L (ref 3.5–5.1)
Sodium: 151 mmol/L — ABNORMAL HIGH (ref 135–145)
Sodium: 151 mmol/L — ABNORMAL HIGH (ref 135–145)
Sodium: 151 mmol/L — ABNORMAL HIGH (ref 135–145)
Sodium: 151 mmol/L — ABNORMAL HIGH (ref 135–145)
Sodium: 152 mmol/L — ABNORMAL HIGH (ref 135–145)
Sodium: 152 mmol/L — ABNORMAL HIGH (ref 135–145)
Sodium: 152 mmol/L — ABNORMAL HIGH (ref 135–145)
TCO2: 30 mmol/L (ref 22–32)
TCO2: 30 mmol/L (ref 22–32)
TCO2: 30 mmol/L (ref 22–32)
TCO2: 31 mmol/L (ref 22–32)
TCO2: 31 mmol/L (ref 22–32)
TCO2: 31 mmol/L (ref 22–32)
TCO2: 32 mmol/L (ref 22–32)
pCO2 arterial: 49.4 mmHg — ABNORMAL HIGH (ref 32.0–48.0)
pCO2 arterial: 49.8 mmHg — ABNORMAL HIGH (ref 32.0–48.0)
pCO2 arterial: 50.5 mmHg — ABNORMAL HIGH (ref 32.0–48.0)
pCO2 arterial: 56.2 mmHg — ABNORMAL HIGH (ref 32.0–48.0)
pCO2 arterial: 57.1 mmHg — ABNORMAL HIGH (ref 32.0–48.0)
pCO2 arterial: 57.3 mmHg — ABNORMAL HIGH (ref 32.0–48.0)
pCO2 arterial: 59.8 mmHg — ABNORMAL HIGH (ref 32.0–48.0)
pH, Arterial: 7.282 — ABNORMAL LOW (ref 7.350–7.450)
pH, Arterial: 7.298 — ABNORMAL LOW (ref 7.350–7.450)
pH, Arterial: 7.313 — ABNORMAL LOW (ref 7.350–7.450)
pH, Arterial: 7.333 — ABNORMAL LOW (ref 7.350–7.450)
pH, Arterial: 7.367 (ref 7.350–7.450)
pH, Arterial: 7.373 (ref 7.350–7.450)
pH, Arterial: 7.384 (ref 7.350–7.450)
pO2, Arterial: 54 mmHg — ABNORMAL LOW (ref 83.0–108.0)
pO2, Arterial: 54 mmHg — ABNORMAL LOW (ref 83.0–108.0)
pO2, Arterial: 54 mmHg — ABNORMAL LOW (ref 83.0–108.0)
pO2, Arterial: 58 mmHg — ABNORMAL LOW (ref 83.0–108.0)
pO2, Arterial: 58 mmHg — ABNORMAL LOW (ref 83.0–108.0)
pO2, Arterial: 80 mmHg — ABNORMAL LOW (ref 83.0–108.0)
pO2, Arterial: 82 mmHg — ABNORMAL LOW (ref 83.0–108.0)

## 2019-09-09 LAB — CBC
HCT: 27.9 % — ABNORMAL LOW (ref 39.0–52.0)
HCT: 28.7 % — ABNORMAL LOW (ref 39.0–52.0)
Hemoglobin: 8 g/dL — ABNORMAL LOW (ref 13.0–17.0)
Hemoglobin: 8.4 g/dL — ABNORMAL LOW (ref 13.0–17.0)
MCH: 27.8 pg (ref 26.0–34.0)
MCH: 28.4 pg (ref 26.0–34.0)
MCHC: 28.7 g/dL — ABNORMAL LOW (ref 30.0–36.0)
MCHC: 29.3 g/dL — ABNORMAL LOW (ref 30.0–36.0)
MCV: 96.9 fL (ref 80.0–100.0)
MCV: 97 fL (ref 80.0–100.0)
Platelets: 126 10*3/uL — ABNORMAL LOW (ref 150–400)
Platelets: 120 10*3/uL — ABNORMAL LOW (ref 150–400)
RBC: 2.88 MIL/uL — ABNORMAL LOW (ref 4.22–5.81)
RBC: 2.96 MIL/uL — ABNORMAL LOW (ref 4.22–5.81)
RDW: 18.9 % — ABNORMAL HIGH (ref 11.5–15.5)
RDW: 19 % — ABNORMAL HIGH (ref 11.5–15.5)
WBC: 12.2 10*3/uL — ABNORMAL HIGH (ref 4.0–10.5)
WBC: 11.4 10*3/uL — ABNORMAL HIGH (ref 4.0–10.5)
nRBC: 3.5 % — ABNORMAL HIGH (ref 0.0–0.2)
nRBC: 3.5 % — ABNORMAL HIGH (ref 0.0–0.2)

## 2019-09-09 LAB — GLUCOSE, CAPILLARY
Glucose-Capillary: 196 mg/dL — ABNORMAL HIGH (ref 70–99)
Glucose-Capillary: 193 mg/dL — ABNORMAL HIGH (ref 70–99)
Glucose-Capillary: 228 mg/dL — ABNORMAL HIGH (ref 70–99)
Glucose-Capillary: 175 mg/dL — ABNORMAL HIGH (ref 70–99)
Glucose-Capillary: 139 mg/dL — ABNORMAL HIGH (ref 70–99)

## 2019-09-09 LAB — BASIC METABOLIC PANEL
Anion gap: 14 (ref 5–15)
Anion gap: 13 (ref 5–15)
BUN: 110 mg/dL — ABNORMAL HIGH (ref 6–20)
BUN: 120 mg/dL — ABNORMAL HIGH (ref 6–20)
CO2: 28 mmol/L (ref 22–32)
CO2: 27 mmol/L (ref 22–32)
Calcium: 9.5 mg/dL (ref 8.9–10.3)
Calcium: 9.5 mg/dL (ref 8.9–10.3)
Chloride: 110 mmol/L (ref 98–111)
Chloride: 111 mmol/L (ref 98–111)
Creatinine, Ser: 1.28 mg/dL — ABNORMAL HIGH (ref 0.61–1.24)
Creatinine, Ser: 1.28 mg/dL — ABNORMAL HIGH (ref 0.61–1.24)
GFR calc Af Amer: >60 mL/min (ref >60)
GFR calc Af Amer: >60 mL/min (ref >60)
GFR calc non Af Amer: >60 mL/min (ref >60)
GFR calc non Af Amer: >60 mL/min (ref >60)
Glucose, Bld: 221 mg/dL — ABNORMAL HIGH (ref 70–99)
Glucose, Bld: 187 mg/dL — ABNORMAL HIGH (ref 70–99)
Potassium: 3.9 mmol/L (ref 3.5–5.1)
Potassium: 4.2 mmol/L (ref 3.5–5.1)
Sodium: 152 mmol/L — ABNORMAL HIGH (ref 135–145)
Sodium: 151 mmol/L — ABNORMAL HIGH (ref 135–145)

## 2019-09-09 LAB — TYPE AND SCREEN
ABO/RH(D): O POS
Antibody Screen: NEGATIVE
Unit division: 0
Unit division: 0
Unit division: 0
Unit division: 0
Unit division: 0
Unit division: 0
Unit division: 0
Unit division: 0
Unit division: 0

## 2019-09-09 LAB — BPAM RBC
Blood Product Expiration Date: 202102122359
Blood Product Expiration Date: 202102232359
Blood Product Expiration Date: 202102232359
Blood Product Expiration Date: 202102252359
Blood Product Expiration Date: 202102252359
Blood Product Expiration Date: 202102262359
Blood Product Expiration Date: 202102262359
Blood Product Expiration Date: 202102272359
Blood Product Expiration Date: 202102282359
ISSUE DATE / TIME: 202101282156
ISSUE DATE / TIME: 202101290912
ISSUE DATE / TIME: 202101291416
ISSUE DATE / TIME: 202101291439
ISSUE DATE / TIME: 202101292257
ISSUE DATE / TIME: 202101301233
Unit Type and Rh: 5100
Unit Type and Rh: 5100
Unit Type and Rh: 5100
Unit Type and Rh: 5100
Unit Type and Rh: 5100
Unit Type and Rh: 5100
Unit Type and Rh: 5100
Unit Type and Rh: 5100
Unit Type and Rh: 5100

## 2019-09-09 LAB — HEPARIN LEVEL (UNFRACTIONATED)
Heparin Unfractionated: 0.39 IU/mL (ref 0.30–0.70)
Heparin Unfractionated: 0.39 IU/mL (ref 0.30–0.70)
Heparin Unfractionated: 0.39 IU/mL (ref 0.30–0.70)

## 2019-09-09 LAB — COOXEMETRY PANEL
Carboxyhemoglobin: 3.7 % — ABNORMAL HIGH (ref 0.5–1.5)
Methemoglobin: 1.2 % (ref 0.0–1.5)
O2 Saturation: 80.5 %
Total hemoglobin: 9.1 g/dL — ABNORMAL LOW (ref 12.0–16.0)

## 2019-09-09 LAB — FIBRINOGEN: Fibrinogen: 734 mg/dL — ABNORMAL HIGH (ref 210–475)

## 2019-09-09 LAB — PATHOLOGIST SMEAR REVIEW

## 2019-09-09 LAB — PREPARE RBC (CROSSMATCH)

## 2019-09-09 LAB — APTT
aPTT: 118 seconds — ABNORMAL HIGH (ref 24–36)
aPTT: 111 seconds — ABNORMAL HIGH (ref 24–36)

## 2019-09-09 LAB — LACTIC ACID, PLASMA: Lactic Acid, Venous: 1.5 mmol/L (ref 0.5–1.9)

## 2019-09-09 LAB — LACTATE DEHYDROGENASE: LDH: 557 U/L — ABNORMAL HIGH (ref 98–192)

## 2019-09-09 LAB — POCT ACTIVATED CLOTTING TIME: Activated Clotting Time: 0 seconds

## 2019-09-09 MED ORDER — PROPRANOLOL HCL 20 MG/5ML PO SOLN
20.0000 mg | Freq: Three times a day (TID) | ORAL | Status: DC
  Administered 2019-09-09 – 2019-09-17 (×17): 20 mg
  Filled 2019-09-09 (×27): qty 5

## 2019-09-09 MED ORDER — AMIODARONE LOAD VIA INFUSION
150.0000 mg | Freq: Once | INTRAVENOUS | Status: AC
  Filled 2019-09-09: qty 83.34

## 2019-09-09 MED ORDER — HYDROMORPHONE HCL 2 MG PO TABS
16.0000 mg | ORAL_TABLET | ORAL | Status: DC
  Administered 2019-09-09 – 2019-09-19 (×51): 16 mg
  Filled 2019-09-09 (×54): qty 8

## 2019-09-09 MED ORDER — AMIODARONE HCL IN DEXTROSE 360-4.14 MG/200ML-% IV SOLN
30.0000 mg/h | INTRAVENOUS | Status: DC
  Administered 2019-09-09 – 2019-09-10 (×2): 30 mg/h via INTRAVENOUS
  Filled 2019-09-09: qty 200

## 2019-09-09 MED ORDER — METOLAZONE 2.5 MG PO TABS
2.5000 mg | ORAL_TABLET | Freq: Once | ORAL | Status: AC
  Administered 2019-09-09: 08:00:00 2.5 mg
  Filled 2019-09-09: qty 1

## 2019-09-09 MED ORDER — MAGIC MOUTHWASH
5.0000 mL | Freq: Three times a day (TID) | ORAL | Status: DC | PRN
  Administered 2019-09-09 – 2019-09-12 (×5): 5 mL via ORAL
  Filled 2019-09-09 (×5): qty 5

## 2019-09-09 MED ORDER — SODIUM CHLORIDE 0.9% IV SOLUTION: Freq: Once | INTRAVENOUS | Status: AC

## 2019-09-09 MED ORDER — DIAZEPAM 5 MG PO TABS
20.0000 mg | ORAL_TABLET | Freq: Four times a day (QID) | ORAL | Status: DC
  Administered 2019-09-09 – 2019-09-18 (×32): 20 mg
  Filled 2019-09-09 (×32): qty 4

## 2019-09-09 MED ORDER — AMIODARONE HCL IN DEXTROSE 360-4.14 MG/200ML-% IV SOLN
60.0000 mg/h | INTRAVENOUS | Status: AC
  Administered 2019-09-09: 60 mg/h via INTRAVENOUS
  Filled 2019-09-09: qty 200

## 2019-09-09 MED ORDER — AMIODARONE HCL IN DEXTROSE 360-4.14 MG/200ML-% IV SOLN
INTRAVENOUS | Status: AC
  Administered 2019-09-09: 150 mg via INTRAVENOUS
  Filled 2019-09-09: qty 200

## 2019-09-09 MED ORDER — MILRINONE LACTATE IN DEXTROSE 20-5 MG/100ML-% IV SOLN
0.1250 ug/kg/min | INTRAVENOUS | Status: DC
  Administered 2019-09-09 – 2019-09-10 (×2): 0.125 ug/kg/min via INTRAVENOUS
  Filled 2019-09-09 (×3): qty 100

## 2019-09-09 MED ORDER — POTASSIUM CHLORIDE 20 MEQ/15ML (10%) PO SOLN
40.0000 meq | Freq: Once | ORAL | Status: AC
  Administered 2019-09-09: 40 meq
  Filled 2019-09-09: qty 30

## 2019-09-09 NOTE — Progress Notes (Signed)
Inpatient Diabetes Program Recommendations  AACE/ADA: New Consensus Statement on Inpatient Glycemic Control (2015)  Target Ranges:  Prepandial:   less than 140 mg/dL      Peak postprandial:   less than 180 mg/dL (1-2 hours)      Critically ill patients:  140 - 180 mg/dL   Lab Results  Component Value Date   GLUCAP 193 (H) 09/09/2019   HGBA1C 6.7 (H) 07/24/2019    Review of Glycemic Control Results for [name], [name] (MRN [id number]) as of 09/09/2019 13:09  Ref. Range 09/08/2019 16:39 09/08/2019 19:36 09/08/2019 22:54 09/09/2019 03:18 09/09/2019 07:30  Glucose-Capillary Latest Ref Range: 70 - 99 mg/dL 191 (H) 202 (H) 210 (H) 196 (H) 193 (H)   Diabetes history: None Outpatient Diabetes medications: None Current orders for Inpatient glycemic control:  Novolog resistant q 4 hours Levemir 20 units bid Pivot 75 cc/hr Inpatient Diabetes Program Recommendations:    May consider adding Novolog tube feed coverage 3 units q 4 hours.   Thanks  Adah Perl, RN, BC-ADM Inpatient Diabetes Coordinator Pager [phone]

## 2019-09-09 NOTE — CV Procedure (Signed)
ECMO NOTE:  Indication: Respiratory failure due to COVID PNA  Initial cannulation date: 08/03/2019  ECMO type: VV ECMO (Cardio-Help)  Return cannula:  1) 21 FR RFV in RA  Inflow Cannulas:   1) 25 FR  LFV  ECMO events:  Initial cannulation 12/26 Circuit changed and trach exchanged on 1/8.   Underwent addition of a RIJ drainage cannula on 1/20 Circuit changed on 1/22 for rising LDH Had evidence of recirculation and RIJ cannula pulled back on 1/26 19 FR RIJ removed 09/06/19  Daily data:  Remains on VV ecmo with good flows.   Flow 4.16 RPM 3700 dP 33 Pven  -113 Sweep 15  SVO2 85.9%  Labs:  Hgb 8.8 LDH 450 -> 481 -> 508 -> 557 Heparin level: 0.39  Plan:  Failed sedation and sweep gas wean last night.  Resedate.  Restart lasix gtt.  Bronch today for worsening CXR  Glori Bickers, MD  8:05 AM

## 2019-09-09 NOTE — Progress Notes (Signed)
Patient ID: [name], male   DOB: 08/31/1975, 44 y.o.   MRN: [id number]    Advanced Heart Failure Rounding Note   Subjective:    Circuit changed and trach exchanged on 1/8.   Underwent addition of a RIJ drainage cannula on 1/20 Circuit changed on 1/22 for rising LDH Had evidence of recirculation and RIJ cannula pulled back on 1/26  Remains on VV ecmo with good flows.   Sedation weaned briskly overnight and attempt to wean sweep. But did not tolerate. This am has increased WOB. Sats 88-89%  ABG 7.37/49/54/87%  Co-ox 81%   Lasix gtt turned off  Weight up 10 pounds  CXR: Diffuse bilateral lung opacities - worse today. Personally reviewed   On vent now at 100% PEEP 15. PlatP 30, Bi-vent setting.TVs ~250cc Personally reviewed  hgb 9.5 LDH 480 -> 445 -> 432-> 450 -> 481--->  509 ---> 557  ECMO parameters - see separate ecmo note   Objective:   Weight Range:  Vital Signs:   Temp:  [97.5 F (36.4 C)-98.7 F (37.1 C)] 98.1 F (36.7 C) (02/01 0319) Pulse Rate:  [68-131] 104 (02/01 0718) Resp:  [14-30] 27 (02/01 0718) BP: (113-178)/(45-62) 121/49 (02/01 0718) SpO2:  [80 %-93 %] 88 % (02/01 0718) Arterial Line BP: (62-191)/(28-67) 130/54 (02/01 0645) FiO2 (%):  [70 %-100 %] 100 % (02/01 0718) Weight:  [148 kg] 148 kg (02/01 0453) Last BM Date: 09/08/19  Weight change: Filed Weights   09/07/19 0500 09/08/19 0500 09/09/19 0453  Weight: (!) 143.9 kg (!) 149.6 kg (!) 148 kg    Intake/Output:   Intake/Output Summary (Last 24 hours) at 09/09/2019 0753 Last data filed at 09/09/2019 0600 Gross per 24 hour  Intake 5551.22 ml  Output 3400 ml  Net 2151.22 ml     Physical Exam: General: Sedated on vent. Bed inclined + anasarca HEENT: normal skin breakdown on occiput Neck: supple.+ trach  Carotids 2+ bilat; no bruits. No lymphadenopathy or thryomegaly appreciated. Cannula extraction site ok  Cor: PMI nondisplaced. Regular rate & rhythm. No rubs, gallops or  murmurs. Lungs: minimal air movement  Abdomen: obese soft, nontender, nondistended. No hepatosplenomegaly. No bruits or masses. Good bowel sounds. Extremities: no cyanosis, clubbing, rash, 3+ edema + UNNA Neuro: sedated on vent   Telemetry: Sinus tach 100-110s  Personally reviewed  Labs: Basic Metabolic Panel: Recent Labs  Lab 09/06/19 1911 09/06/19 1955 09/07/19 0433 09/07/19 0443 09/08/19 0245 09/08/19 0253 09/08/19 1631 09/08/19 1631 09/08/19 1638 09/08/19 1938 09/08/19 2241 09/09/19 0304 09/09/19 0320  NA 150*   < > 151*   < > 152*   < > 150*   < > 152* 151* 151* 152* 152*  K 3.9   < > 4.2   < > 3.7   < > 4.0   < > 3.9 4.0 3.9 3.9 3.8  CL 110  --  107  --  110  --  110  --   --   --   --  110  --   CO2 28  --  30  --  27  --  27  --   --   --   --  28  --   GLUCOSE 204*  --  163*  --  208*  --  223*  --   --   --   --  221*  --   BUN 79*  --  81*  --  98*  --  106*  --   --   --   --  110*  --   CREATININE 0.94  --  0.99  --  1.19  --  1.19  --   --   --   --  1.28*  --   CALCIUM 9.1   < > 9.4   < > 9.4  --  9.4  --   --   --   --  9.5  --   MG  --   --  2.8*  --   --   --   --   --   --   --   --   --   --    < > = values in this interval not displayed.    Liver Function Tests: No results for input(s): AST, ALT, ALKPHOS, BILITOT, PROT, ALBUMIN in the last 168 hours. No results for input(s): LIPASE, AMYLASE in the last 168 hours. No results for input(s): AMMONIA in the last 168 hours.  CBC: Recent Labs  Lab 09/07/19 1111 09/07/19 1111 09/07/19 1545 09/07/19 1554 09/08/19 0245 09/08/19 0253 09/08/19 1631 09/08/19 1631 09/08/19 1638 09/08/19 1938 09/08/19 2241 09/09/19 0304 09/09/19 0320  WBC 10.1  --  10.1  --  10.8*  --  12.0*  --   --   --   --  12.2*  --   HGB 8.2*   < > 8.6*   < > 8.6*   < > 8.4*   < > 8.8* 8.5* 8.5* 8.0* 8.5*  HCT 27.7*   < > 29.0*   < > 29.0*   < > 28.5*   < > 26.0* 25.0* 25.0* 27.9* 25.0*  MCV 95.8  --  94.8  --  94.5  --  95.3   --   --   --   --  96.9  --   PLT 122*  --  129*  --  128*  --  123*  --   --   --   --  126*  --    < > = values in this interval not displayed.    Cardiac Enzymes: No results for input(s): CKTOTAL, CKMB, CKMBINDEX, TROPONINI in the last 168 hours.  BNP: BNP (last 3 results) Recent Labs    07/22/19 1039  BNP 15.3    ProBNP (last 3 results) No results for input(s): PROBNP in the last 8760 hours.    Other results:  Imaging: DG CHEST PORT 1 VIEW  Result Date: 09/09/2019 CLINICAL DATA:  Cardia respiratory failure EXAM: PORTABLE CHEST 1 VIEW COMPARISON:  Yesterday FINDINGS: Widespread dense pulmonary opacification in this patient on ECMO. The IVC cannula is in stable position. Bilateral central lines in stable place. Tracheostomy tube and feeding tube in place. Bilateral chest tube in stable position with no visible pneumothorax. IMPRESSION: Stable hardware positioning and pulmonary opacity. No visible pneumothorax. Electronically Signed   By: Monte Fantasia M.D.   On: 09/09/2019 06:01   DG Chest Port 1 View  Result Date: 09/08/2019 CLINICAL DATA:  Acute respiratory failure. EXAM: PORTABLE CHEST 1 VIEW COMPARISON:  Chest radiograph 09/07/2019 FINDINGS: ET tube mid trachea. Right upper extremity PICC line tip projects over the superior vena cava. Enteric tube courses inferior to the diaphragm. Left subclavian central venous catheter tip projects over the central left brachiocephalic vein. Bilateral chest tubes in place. Inferior vena cava cannula tip terminates at the level of the lower right atrium, unchanged. Obscured cardiac and mediastinal contours. Mild interval worsening of diffuse bilateral airspace opacities particularly within the lower lungs bilaterally. IMPRESSION:  Mild interval worsening of the diffuse bilateral airspace opacities, particularly within the lower lungs bilaterally. Electronically Signed   By: Lovey Newcomer M.D.   On: 09/08/2019 06:56   DG CHEST PORT 1  VIEW  Result Date: 09/07/2019 CLINICAL DATA:  Respiratory failure. EXAM: PORTABLE CHEST 1 VIEW COMPARISON:  Chest radiograph 09/07/2019 FINDINGS: ET tube terminates in the mid trachea. Enteric tube courses inferior to the diaphragm. Inferior vena cava cannula tip terminates at the level of the lower right atrium. Bilateral chest tubes in place. Left subclavian central venous catheter tip projects over the central left brachiocephalic vein. Right upper extremity PICC line tip projects over the superior vena cava. Stable cardiac and mediastinal contours. Low lung volumes. Similar-appearing diffuse bilateral airspace opacities. IMPRESSION: Support apparatus as above. Redemonstrated bilateral chest tubes without definite pneumothorax. Redemonstrated extensive bilateral airspace opacities. Electronically Signed   By: Lovey Newcomer M.D.   On: 09/07/2019 11:15     Medications:     Scheduled Medications: . sodium chloride   Intravenous Once  . amiodarone  150 mg Intravenous Once  . vitamin C  500 mg Per Tube Daily  . chlorhexidine gluconate (MEDLINE KIT)  15 mL Mouth Rinse BID  . Chlorhexidine Gluconate Cloth  6 each Topical Daily  . diazepam  10 mg Per Tube Q6H  . feeding supplement (PRO-STAT SUGAR FREE 64)  60 mL Per Tube BID  . free water  300 mL Per Tube Q8H  . gabapentin  200 mg Per Tube Q8H  . HYDROmorphone  12 mg Per Tube Q4H  . insulin aspart  0-20 Units Subcutaneous Q4H  . insulin detemir  20 Units Subcutaneous BID  . ipratropium-albuterol  3 mL Nebulization Q4H  . mouth rinse  15 mL Mouth Rinse 10 times per day  . montelukast  10 mg Per Tube QHS  . oxymetazoline  1 spray Each Nare BID  . pantoprazole sodium  40 mg Per Tube QHS  . QUEtiapine  50 mg Per Tube BID  . sodium chloride flush  10-40 mL Intracatheter Q12H  . Thrombi-Pad  1 each Topical Once  . Thrombi-Pad  1 each Topical Once  . white petrolatum   Topical BID  . zinc sulfate  220 mg Per Tube Daily    Infusions: . sodium  chloride    . sodium chloride Stopped (09/05/19 1900)  . sodium chloride Stopped (09/07/19 1339)  . albumin human Stopped (09/09/19 0518)  . amiodarone 30 mg/hr (09/09/19 0600)  .  ceFAZolin (ANCEF) IV 200 mL/hr at 09/09/19 0600  . dexmedetomidine (PRECEDEX) IV infusion 1.6 mcg/kg/hr (09/09/19 0644)  . feeding supplement (PIVOT 1.5 CAL) 1,000 mL (09/09/19 0447)  . furosemide (LASIX) infusion 10 mg/hr (09/09/19 0600)  . heparin 2,550 Units/hr (09/09/19 0600)  . HYDROmorphone 7 mg/hr (09/09/19 0600)  . midazolam 4 mg/hr (09/09/19 0600)  . norepinephrine Stopped (09/07/19 1539)  . vasopressin (PITRESSIN) infusion - *FOR SHOCK* 0.03 Units/min (09/07/19 1033)    PRN Medications: Place/Maintain arterial line **AND** sodium chloride, sodium chloride, sodium chloride, acetaminophen (TYLENOL) oral liquid 160 mg/5 mL, acetaminophen, albumin human, hydrALAZINE, HYDROmorphone, magnesium hydroxide, midazolam, ondansetron (ZOFRAN) IV, senna-docusate, sodium chloride   Assessment/Plan:    1. Acute hypoxic/hypercarbic respiratory failure due to COVID PNA: Remained markedly hypoxic despite full support. Intubated 12/26. S/p bilateral CTs 12/26 for pneumothoraces.  TEE on 12/26 LVEF 70% RV ok. VV ECMO begun 12/26. Tracheostomy 12/29.  COVID ARDS at this point. ECMO circuit changed 1/8 & 1/23 - ECMO parameters  as per ECMO note - Flows look good. - Failed weaning of sedation and sweep gas last night. Now looks fatigued. TV down 350 -> 250 - Remains massively volume  - CXR worse - LDH 480 -> 412 -> 445 -> 432 -> 450 -> 481 -> 508 -> 557  Watch closely - Continue heparin. Discussed dosing with PharmD personally. - Resedate and let him rest for a bit now before any further attempts at weaning - Restart lasix gtt - Bronch today with worsening CXR - Continue chest PT  2. Bilateral PTX: Due to barotrauma.  - Driving pressure minimized at < 15 Stable. No change  3. COVID PNA: management of COVID  infection per CCM. CXR with bilateral multifocal PNA progressing to likely ARDS.  - s/p 10 days of remdesivir - 12/16, 12/2 s/p tociluzimab - 12/17 s/p convalescent plasma - Decadron at 33m/daily completed 1/11 - Completed cefepime/vanc - On Ancef for nasal packing/sinusitis  4. ID: Empiric coverage with vancomycin/cefepime initially for possible secondary bacterial PNA, course completed.   - treated with cefepime and vanc for + cx with staph epidimidis, MSSA and serratia (sensitivities ok). - On ceftriaxone for nasal packing. D/w ID pharmacy -> will switch to Ancef ro better gram positive coverage  5. Thrombocytopenia: Slow decrease in platelets, likely low level hemolysis + critical illness.  - resolved   6. Anemia: Bleeding at trach site resolved but still with epistaxis - hgb 8.5  today - Transfuse hgb < 8  7. FEN - continue TFs via oral Cor-trak  8.Dilated R pupil - will get head CT  9. Hypernatremia - NA up to 155. Continue FW flushes  D/w with ECMO team (ECMO coordinator/specialists, CCM, TCTS and pharmD) at bedside on multidisciplinary rounds. Continue to await ARDS recovery. Details as above.   Length of Stay: 430 CRITICAL CARE Performed by: BGlori Bickers Total critical care time: 35 minutes  Critical care time was exclusive of separately billable procedures and treating other patients.  Critical care was necessary to treat or prevent imminent or life-threatening deterioration.  Critical care was time spent personally by me (independent of midlevel providers or residents) on the following activities: development of treatment plan with patient and/or surrogate as well as nursing, discussions with consultants, evaluation of patient's response to treatment, examination of patient, obtaining history from patient or surrogate, ordering and performing treatments and interventions, ordering and review of laboratory studies, ordering and review of radiographic studies,  pulse oximetry and re-evaluation of patient's condition.    DGlori BickersMD 09/09/2019, 7:53 AM  Advanced Heart Failure Team Pager [phone](M-F; 7a - 4p)  Please contact [facility] for night-coverage after hours (4p -7a ) and weekends on [email or web address]

## 2019-09-09 NOTE — Procedures (Signed)
Bronchoscopy Procedure Note NELL SCHRACK [id number] 12-28-1975  Procedure: Bronchoscopy Indications: Obtain specimens for culture and/or other diagnostic studies and Remove secretions  Procedure Details Consent: Risks of procedure as well as the alternatives and risks of each were explained to the (patient/caregiver).  Consent for procedure obtained. Time Out: Verified patient identification, verified procedure, site/side was marked, verified correct patient position, special equipment/implants available, medications/allergies/relevent history reviewed, required imaging and test results available.  Performed  In preparation for procedure, patient was given 100% FiO2 and bronchoscope lubricated. Sedation: Benzodiazepines and narcotics  Airway entered and the following bronchi were examined: RUL, RML, RLL, LUL, LLL and Bronchi.   Procedures performed: serial BAL of LLL with progressive clearing of blood with serial washings. No bleeding seen thereafter. Only minimal clear secretions on the right side. Large clot removed from left.         Bronchoscope removed.  , Patient placed back on 100% FiO2 at conclusion of procedure.    Evaluation Hemodynamic Status: BP stable throughout; O2 sats: transiently fell during during procedure Patient's Current Condition: stable Specimens:  Sent serosanguinous fluid Complications: No apparent complications Patient did tolerate procedure well.   Einar Grad Julizza Sassone 09/09/2019

## 2019-09-09 NOTE — Progress Notes (Signed)
New blue

## 2019-09-09 NOTE — Progress Notes (Signed)
ANTICOAGULATION CONSULT NOTE - Follow Up Consult  Pharmacy Consult for Heparin Indication: ECMO  Patient Measurements: Height: 5' 8"  (172.7 cm) Weight: (!) 328 lb 7.8 oz (149 kg) IBW/kg (Calculated) : 68.4 Heparin Dosing Weight: 104.5kg  Vital Signs: Temp: 97.5 F (36.4 C) (01/21 1600) Temp Source: Axillary (01/21 1600) Pulse Rate: 87 (01/21 1700)  Labs: Recent Labs    09/07/19 1545 09/08/19 0245 09/08/19 0253 09/08/19 1137 09/08/19 1631 09/08/19 1638 09/08/19 2241 09/08/19 2241 09/09/19 0304 09/09/19 0320  HGB   < > 8.6* 8.8*   < > 8.4*   < > 8.5*   < > 8.0* 8.5*  HCT   < > 29.0* 26.0*   < > 28.5*   < > 25.0*  --  27.9* 25.0*  PLT  --  128*  --   --  123*  --   --   --  126*  --   APTT  --  113*  --   --  104*  --   --   --  118*  --   HEPARINUNFRC   < >  --  0.37  --  0.39  --   --   --  0.39  --   CREATININE  --  1.19  --   --  1.19  --   --   --  1.28*  --    < > = values in this interval not displayed.    Assessment: 44yo male on VV ECMO on heparin infusion.  -Heparin level therapeutic at 0.39 on heparin drip 2550 units/hr. Nosebleed appears stable - packing removed 1/29 s/p TXA spray applied.  H/H stable tonight post PRBC earlier   LDH trending up - watch  2/1 AM update:  Heparin level remains therapeutic  Hgb stable  Goal of Therapy:  Heparin level 0.3-0.5 units/ml   Plan:  Continue IV heparin at 2550 units/hr. Monitor heparin level and CBC 5AM/5PM, daily LDH Monitor for any increase in bleeding from mouth/nose   Narda Bonds, PharmD, [place] Pharmacist Phone: [phone]

## 2019-09-09 NOTE — Progress Notes (Signed)
ANTICOAGULATION CONSULT NOTE - Follow Up Consult  Pharmacy Consult for Heparin Indication: ECMO  Patient Measurements: Height: 5' 8"  (172.7 cm) Weight: (!) 328 lb 7.8 oz (149 kg) IBW/kg (Calculated) : 68.4 Heparin Dosing Weight: 104.5kg  Vital Signs: Temp: 97.5 F (36.4 C) (01/21 1600) Temp Source: Axillary (01/21 1600) Pulse Rate: 87 (01/21 1700)  Labs: Recent Labs    09/08/19 0245 09/08/19 0253 09/08/19 1631 09/08/19 1638 09/09/19 0304 09/09/19 0320 09/09/19 1251 09/09/19 1333 09/09/19 1347 09/09/19 1347 09/09/19 1552 09/09/19 1600 09/09/19 1601  HGB 8.6*   < > 8.4*   < > 8.0*   < >   < >  --  9.9*   < > 8.4*  --  9.5*  HCT 29.0*   < > 28.5*   < > 27.9*   < >   < >  --  29.0*  --  28.7*  --  28.0*  PLT 128*   < > 123*  --  126*  --   --   --   --   --  120*  --   --   APTT 113*  --  104*  --  118*  --   --   --   --   --   --   --   --   HEPARINUNFRC  --    < > 0.39   < > 0.39  --   --  0.39  --   --  0.39  --   --   CREATININE 1.19   < > 1.19  --  1.28*  --   --   --   --   --   --  1.28*  --    < > = values in this interval not displayed.    Assessment: 44yo male on VV ECMO on heparin infusion.   Patient was making less urine during shift - IV heparin and lasix running in peripheral line. Line site switched and starting producing more urine - question if issue with IV line. Got heparin level to see if any increase - will still plan on continuing q12 hr checks as normal.  This level was 0.39 at 13:33PM.   Recheck for PM: Heparin level remains therapeutic at 0.39 at 1600PM, on 2550 units/hr. (Next level for 5AM tomorrow).  Hgb 9.5, last plts 120. Last Fibrinogen 734. Last LDH 557. Nosebleed appears stable - packing removed 1/29 s/p TXA spray applied.     Goal of Therapy:  Heparin level 0.3-0.5 units/ml   Plan:  Continue IV heparin at 2550 units/hr. Monitor heparin level and CBC 5AM/5PM, daily LDH Monitor for any increase in bleeding from  mouth/nose  Sloan Leiter, PharmD, BCPS, BCCCP Clinical Pharmacist Please refer to [facility] for [place] numbers 09/09/2019, 5:58 PM

## 2019-09-09 NOTE — Plan of Care (Signed)
  Problem: Education: Goal: Knowledge of risk factors and measures for prevention of condition will improve Outcome: Progressing   Problem: Coping: Goal: Psychosocial and spiritual needs will be supported Outcome: Progressing   Problem: Respiratory: Goal: Will maintain a patent airway Outcome: Progressing Goal: Complications related to the disease process, condition or treatment will be avoided or minimized Outcome: Progressing   Problem: Respiratory: Goal: Ability to maintain a clear airway and adequate ventilation will improve Outcome: Progressing   Problem: Role Relationship: Goal: Method of communication will improve Outcome: Progressing   Problem: Education: Goal: Knowledge of General Education information will improve Description: Including pain rating scale, medication(s)/side effects and non-pharmacologic comfort measures Outcome: Progressing   Problem: Health Behavior/Discharge Planning: Goal: Ability to manage health-related needs will improve Outcome: Progressing   Problem: Clinical Measurements: Goal: Ability to maintain clinical measurements within normal limits will improve Outcome: Progressing Goal: Will remain free from infection Outcome: Progressing Goal: Diagnostic test results will improve Outcome: Progressing Goal: Respiratory complications will improve Outcome: Progressing Goal: Cardiovascular complication will be avoided Outcome: Progressing   Problem: Activity: Goal: Risk for activity intolerance will decrease Outcome: Progressing   Problem: Nutrition: Goal: Adequate nutrition will be maintained Outcome: Progressing   Problem: Coping: Goal: Level of anxiety will decrease Outcome: Progressing   Problem: Elimination: Goal: Will not experience complications related to bowel motility Outcome: Progressing Goal: Will not experience complications related to urinary retention Outcome: Progressing   Problem: Pain Managment: Goal: General  experience of comfort will improve Outcome: Progressing   Problem: Safety: Goal: Ability to remain free from injury will improve Outcome: Progressing   Problem: Skin Integrity: Goal: Risk for impaired skin integrity will decrease Outcome: Progressing

## 2019-09-09 NOTE — Progress Notes (Signed)
NAME:  [name], MRN:  [id number], DOB:  [birth year], LOS: 86 ADMISSION DATE:  07/22/2019, CONSULTATION DATE:  12/24 REFERRING MD:  Sloan Leiter, CHIEF COMPLAINT:  Dyspnea   Brief History   44 y/o M admitted 12/14 with COVID pneumonia causing acute hypoxemic respiratory failure. Developed pneumomediastinum 12/23 with concern for possible bilateral small pneumothoraces on 12/24.  PCCM consulted for evaluation of pneumothoraces  Past Medical History  GERD HTN Asthma  Significant Hospital Events   12/14 admit with hypoxemic respiratory failure in setting of COVID-19 pneumonia 12/23 noted to have pneumomediastinum on chest x-ray 12/24 PCCM consulted for evaluation 12/26 worsening hypoxemia, intubated  12/27 VV fem/fem ECMO cannulation See bedside nursing event notes for full rundown of procedures after 12/27 1/30 improved cxr, improved lung compliance, TV 350s on 15PC  1/31 CXR increased infiltrates, +CFB      Consults:  PCCM, CTS, CHF, Palliative  Procedures:  See bedside nursing event notes for full rundown of procedures.  Significant Diagnostic Tests:   CT chest 12/22 >> extensive pneumomediastinum, multifocal patchy bilateral groundglass opacities CXR 1/25 >> improving bilateral infiltrates. ECMO cannulae in place.  Micro Data:  BCx2 12/14 >> negative  Sputum 12/15 >> negative  1/9 bronch: serratia and MSSA 1/2 acid fast smear bronch: negative 1/2 fungal cx bronch: pending 1/12 BAL: serratia 1/18 BAL >> rare Serratia.  Antimicrobials:  Received remdesivir, steroids, actemra and convalescent plasma  5 days cefepime 12/24-12/29 Cefepime 1/4->1/14 vanc 1/4->1/14 Ceftriaxone 1/14 >>   Interim history/subjective:   Continued aggressive diuresis over weekend. Sedation weaned and patient has shown increasing respiratory effort. Unfortunately, this has come with an increase in abdominal breathing with some desaturation.   Objective   Blood pressure (!) 121/49, pulse  (!) 104, temperature 98.1 F (36.7 C), temperature source Oral, resp. rate (!) 27, height _0  (1.727 m), weight (!) 148 kg, SpO2 (!) 88 %. CVP:  [11 mmHg-13 mmHg] 12 mmHg  Vent Mode: PCV FiO2 (%):  [70 %-100 %] 100 % Set Rate:  [15 bmp] 15 bmp PEEP:  [15 cmH20] 15 cmH20 Pressure Support:  [16 cmH20] 16 cmH20 Plateau Pressure:  [30 cmH20-34 cmH20] 32 cmH20   Intake/Output Summary (Last 24 hours) at 09/09/2019 0757 Last data filed at 09/09/2019 0600 Gross per 24 hour  Intake 5551.22 ml  Output 3400 ml  Net 2151.22 ml   Filed Weights   09/07/19 0500 09/08/19 0500 09/09/19 0453  Weight: (!) 143.9 kg (!) 149.6 kg (!) 148 kg   Examination General appearance: 44 y.o., male, trach in place, reverse Trendelenburg, Eyes: no response this morning, heavy sedated  HENT: pupils equal and reactive. No further epistaxis. Minimal bleeding from tracheostomy Lungs: abdominal breathing. Bronchial breath sounds left lung and vesicular on right anteriorly. Bronchial from anterior axillary line back bilaterally.  Chest: BL chest tubes, tidaling with air leak.  CV: RRR, s1 s2, no MRG  Abdomen: soft, nt, nd Extremities: diffuse anasarca. Now largely in dependent areas. Unna boots to both legs. Skin: anterior chest folliculitis Neuro: no response to painful stimuli   CXR - increased BL infiltrates in comparison to yesterday. The patient's images have been independently reviewed by me.    CT head 1/28: pansinusitis. CT chest 1/28: Dense bilateral consolidation in dependent lungs both sides CT abdomen 1/28: Cannulae in position in IVC.  Flow (LPM): 4.23 ECMO Mode: VV ECMO Device: Cardiohelp  $ Ventilator Initial/Subsequent : Subsequent, Vent Mode: PCV, Set Rate: 15 bmp, FiO2 (%): 100 %, I Time:  0.8 Sec(s), PEEP: 15 cmH20 Vt today 245m  Net IO Since Admission: 45,594.57 mL [09/09/19 0757]  Resolved Hospital Problem list   MSSA and serratia marcescens pneumonia   Assessment & Plan:   Acute  hypoxemic respiratory failure with ARDS secondary to COVID-19, status post tracheostomy on mechanical ventilation and VV ECMO support.  Slowly resolving ARDS. Limited respiratory reserve with persistently high burden of disease, in keeping with natural history of COVID related ARDS.  - Continue sedative infusion. Increase for patient comfort. Already much lower than previously. Periodic sedation interruptions to challenge patient's respiratory system. - Continue to diurese aggressively for now. Decreasing edema, now largely dependent. Slow if creatinine starts to rise further.  -Continue current ECMO support in combination with ultra lung-protective ventilation.  -Periodic bronchoscopy - will bronch today.  -Increase frequency of chest physiotherapy back to q4h.  Posterior epistaxis appears to have resolved following a period of anticoagulation interruption. Continue nose/nare care Can use oxymetazoline/txa again if needed   Episode of AF. Currently on amiodarone. - discontinue as has now received adequate load. At risk for amiodarone lung toxicity.  Hypernatremia - FW 2084mvia tube.  Daily Goals Checklist   Pain/Anxiety/Delirium protocol (if indicated): Titrate Dilaudid and Versed to comforVAP protocol (if indicated): Bundle in place. Respiratory support goals: Pressure control ventilation with ultra protective lung ventilation. Titrate FiO2 down again. Will attempt to increase PEEP after bronchoscopy. Blood pressure target:  MAP>65, off pressors. Antihypertensives on hold. ECMO SVC cannula removed 09/06/2019 DVT prophylaxis: Remains on systemic heparin - target Heparin level 0.3-0.7 Nutrition Status: Nutrition Problem: Increased nutrient needs Etiology: acute illness(COVID PNA) Signs/Symptoms: estimated needs Interventions: Tube feeding, Prostat GI prophylaxis: Protonix per tube. Fluid status goals: Continue furosemide infusion for approximately 1.5 L net negative per day - may be  approaching limit. Urinary catheter: Guide hemodynamic management Glucose control: Euglycemic control with SSI Mobility/therapy needs: Bedrest.  Daily labs: as per ECMO protocol. Code Status: Full code  Family Communication:family updated by RN. Disposition: ICU  Labs   CBC: CBC Latest Ref Rng & Units 09/09/2019 09/09/2019 09/08/2019  WBC 4.0 - 10.5 K/uL - 12.2(H) -  Hemoglobin 13.0 - 17.0 g/dL 8.5(L) 8.0(L) 8.5(L)  Hematocrit 39.0 - 52.0 % 25.0(L) 27.9(L) 25.0(L)  Platelets 150 - 400 K/uL - 126(L) -    Basic Metabolic Panel: BMP Latest Ref Rng & Units 09/09/2019 09/09/2019 09/08/2019  Glucose 70 - 99 mg/dL - 221(H) -  BUN 6 - 20 mg/dL - 110(H) -  Creatinine 0.61 - 1.24 mg/dL - 1.28(H) -  Sodium 135 - 145 mmol/L 152(H) 152(H) 151(H)  Potassium 3.5 - 5.1 mmol/L 3.8 3.9 3.9  Chloride 98 - 111 mmol/L - 110 -  CO2 22 - 32 mmol/L - 28 -  Calcium 8.9 - 10.3 mg/dL - 9.5 -    GFR: Estimated Creatinine Clearance: 103.3 mL/min (A) (by C-G formula based on SCr of 1.28 mg/dL (H)). Recent Labs  Lab 09/06/19 0405 09/06/19 1250 09/07/19 0433 09/07/19 1111 09/07/19 1545 09/08/19 0245 09/08/19 1631 09/09/19 0304  WBC 14.1*   < > 12.4*   < > 10.1 10.8* 12.0* 12.2*  LATICACIDVEN 1.3  --  1.2  --   --  1.3  --  1.5   < > = values in this interval not displayed.    Liver Function Tests: No results for input(s): AST, ALT, ALKPHOS, BILITOT, PROT, ALBUMIN in the last 168 hours. No results for input(s): LIPASE, AMYLASE in the last 168 hours. No results for  input(s): AMMONIA in the last 168 hours.  ABG    Component Value Date/Time   PHART 7.373 09/09/2019 0320   PCO2ART 49.4 (H) 09/09/2019 0320   PO2ART 54.0 (L) 09/09/2019 0320   HCO3 28.8 (H) 09/09/2019 0320   TCO2 30 09/09/2019 0320   ACIDBASEDEF 2.0 08/28/2019 1519   O2SAT 87.0 09/09/2019 0320     Coagulation Profile: No results for input(s): INR, PROTIME in the last 168 hours.  Cardiac Enzymes: No results for input(s): CKTOTAL,  CKMB, CKMBINDEX, TROPONINI in the last 168 hours.  HbA1C: Hgb A1c MFr Bld  Date/Time Value Ref Range Status  07/24/2019 04:50 AM 6.7 (H) 4.8 - 5.6 % Final    Comment:    (NOTE) Pre diabetes:          5.7%-6.4% Diabetes:              >6.4% Glycemic control for   <7.0% adults with diabetes   09/30/2017 05:36 AM 5.7 (H) 4.8 - 5.6 % Final    Comment:    (NOTE)         Prediabetes: 5.7 - 6.4         Diabetes: >6.4         Glycemic control for adults with diabetes: <7.0     CBG: Recent Labs  Lab 09/08/19 1639 09/08/19 1936 09/08/19 2254 09/09/19 0318 09/09/19 0730  GLUCAP 191* 202* 210* 196* 193*   CRITICAL CARE Performed by: Kipp Brood   Total critical care time: 50 minutes  Critical care time was exclusive of separately billable procedures and treating other patients.  Critical care was necessary to treat or prevent imminent or life-threatening deterioration.  Critical care was time spent personally by me on the following activities: development of treatment plan with patient and/or surrogate as well as nursing, discussions with consultants, evaluation of patient's response to treatment, examination of patient, obtaining history from patient or surrogate, ordering and performing treatments and interventions, ordering and review of laboratory studies, ordering and review of radiographic studies, pulse oximetry, re-evaluation of patient's condition and participation in multidisciplinary rounds.  Kipp Brood, MD [facility] ICU Physician [place]  Pager: [phone] Mobile: [phone] After hours: [phone].   09/09/2019 7:57 AM

## 2019-09-09 NOTE — Progress Notes (Signed)
D/t Dilaudid gtt's bag expiring, wasted 87m of Dilaudid in the unit stericycle with RCandyce Churn RN as witness.

## 2019-09-09 NOTE — Progress Notes (Signed)
ANTICOAGULATION CONSULT NOTE - Follow Up Consult  Pharmacy Consult for Heparin Indication: ECMO  Patient Measurements: Height: 5' 8"  (172.7 cm) Weight: (!) 328 lb 7.8 oz (149 kg) IBW/kg (Calculated) : 68.4 Heparin Dosing Weight: 104.5kg  Vital Signs: Temp: 97.5 F (36.4 C) (01/21 1600) Temp Source: Axillary (01/21 1600) Pulse Rate: 87 (01/21 1700)  Labs: Recent Labs    09/08/19 0245 09/08/19 0253 09/08/19 1631 09/08/19 1638 09/09/19 0304 09/09/19 0320 09/09/19 1139 09/09/19 1139 09/09/19 1251 09/09/19 1333 09/09/19 1347  HGB 8.6*   < > 8.4*   < > 8.0*   < > 9.5*   < > 10.2*  --  9.9*  HCT 29.0*   < > 28.5*   < > 27.9*   < > 28.0*  --  30.0*  --  29.0*  PLT 128*  --  123*  --  126*  --   --   --   --   --   --   APTT 113*  --  104*  --  118*  --   --   --   --   --   --   HEPARINUNFRC  --    < > 0.39  --  0.39  --   --   --   --  0.39  --   CREATININE 1.19  --  1.19  --  1.28*  --   --   --   --   --   --    < > = values in this interval not displayed.    Assessment: 44yo male on VV ECMO on heparin infusion.   Heparin level is therapeutic at 0.39, on 2550 units/hr. Hgb 9.9, plt 126. Fibrinogen 734. LDH 557. Nosebleed appears stable - packing removed 1/29 s/p TXA spray applied.    Patient was making less urine during shift - IV heparin and lasix running in peripheral line. Line site switched and starting producing more urine - question if issue with IV line. Got heparin level to see if any increase - will still plan on continuing q12 hr checks as normal.   Goal of Therapy:  Heparin level 0.3-0.5 units/ml   Plan:  Continue IV heparin at 2550 units/hr. Monitor heparin level and CBC 5AM/5PM, daily LDH Monitor for any increase in bleeding from mouth/nose  Antonietta Jewel, PharmD, [place] Pharmacist  Phone: [phone]  Please check AMION for all [place] phone numbers After 10:00 PM, call [place] [phone]

## 2019-09-09 NOTE — Progress Notes (Signed)
Spoke with pharmacist regarding use of chlorhexidine mouth wash in conjunction with mouth ulcerations. Advised to hold tonight, and will clarify in the morning.  Will use magic mouthwash and medline mouth care tonight.

## 2019-09-10 ENCOUNTER — Inpatient Hospital Stay (HOSPITAL_COMMUNITY): Payer: Managed Care, Other (non HMO)

## 2019-09-10 ENCOUNTER — Inpatient Hospital Stay (HOSPITAL_COMMUNITY): Payer: Managed Care, Other (non HMO) | Admitting: Certified Registered"

## 2019-09-10 ENCOUNTER — Encounter (HOSPITAL_COMMUNITY)
Admission: EM | Disposition: A | Discharge status: Rehabilitation Facility | Payer: Self-pay | Source: Home / Self Care | Attending: Cardiothoracic Surgery

## 2019-09-10 DIAGNOSIS — Z8616 Personal history of COVID-19: Secondary | ICD-10-CM

## 2019-09-10 DIAGNOSIS — J80 Acute respiratory distress syndrome: Secondary | ICD-10-CM

## 2019-09-10 HISTORY — PX: CANNULATION FOR ECMO (EXTRACORPOREAL MEMBRANE OXYGENATION): SHX6796

## 2019-09-10 HISTORY — PX: TEE WITHOUT CARDIOVERSION: SHX5443

## 2019-09-10 LAB — POCT I-STAT 7, (LYTES, BLD GAS, ICA,H+H)
Acid-Base Excess: 4 mmol/L — ABNORMAL HIGH (ref 0.0–2.0)
Acid-Base Excess: 5 mmol/L — ABNORMAL HIGH (ref 0.0–2.0)
Acid-Base Excess: 5 mmol/L — ABNORMAL HIGH (ref 0.0–2.0)
Acid-Base Excess: 5 mmol/L — ABNORMAL HIGH (ref 0.0–2.0)
Acid-Base Excess: 5 mmol/L — ABNORMAL HIGH (ref 0.0–2.0)
Acid-Base Excess: 5 mmol/L — ABNORMAL HIGH (ref 0.0–2.0)
Acid-Base Excess: 5 mmol/L — ABNORMAL HIGH (ref 0.0–2.0)
Acid-Base Excess: 5 mmol/L — ABNORMAL HIGH (ref 0.0–2.0)
Acid-Base Excess: 5 mmol/L — ABNORMAL HIGH (ref 0.0–2.0)
Acid-Base Excess: 5 mmol/L — ABNORMAL HIGH (ref 0.0–2.0)
Acid-Base Excess: 6 mmol/L — ABNORMAL HIGH (ref 0.0–2.0)
Bicarbonate: 27.9 mmol/L (ref 20.0–28.0)
Bicarbonate: 28.3 mmol/L — ABNORMAL HIGH (ref 20.0–28.0)
Bicarbonate: 28.8 mmol/L — ABNORMAL HIGH (ref 20.0–28.0)
Bicarbonate: 29 mmol/L — ABNORMAL HIGH (ref 20.0–28.0)
Bicarbonate: 29.1 mmol/L — ABNORMAL HIGH (ref 20.0–28.0)
Bicarbonate: 29.5 mmol/L — ABNORMAL HIGH (ref 20.0–28.0)
Bicarbonate: 29.5 mmol/L — ABNORMAL HIGH (ref 20.0–28.0)
Bicarbonate: 29.8 mmol/L — ABNORMAL HIGH (ref 20.0–28.0)
Bicarbonate: 29.9 mmol/L — ABNORMAL HIGH (ref 20.0–28.0)
Bicarbonate: 30 mmol/L — ABNORMAL HIGH (ref 20.0–28.0)
Bicarbonate: 30.2 mmol/L — ABNORMAL HIGH (ref 20.0–28.0)
Calcium, Ion: 1.28 mmol/L (ref 1.15–1.40)
Calcium, Ion: 1.3 mmol/L (ref 1.15–1.40)
Calcium, Ion: 1.3 mmol/L (ref 1.15–1.40)
Calcium, Ion: 1.3 mmol/L (ref 1.15–1.40)
Calcium, Ion: 1.3 mmol/L (ref 1.15–1.40)
Calcium, Ion: 1.32 mmol/L (ref 1.15–1.40)
Calcium, Ion: 1.32 mmol/L (ref 1.15–1.40)
Calcium, Ion: 1.33 mmol/L (ref 1.15–1.40)
Calcium, Ion: 1.34 mmol/L (ref 1.15–1.40)
Calcium, Ion: 1.35 mmol/L (ref 1.15–1.40)
Calcium, Ion: 1.35 mmol/L (ref 1.15–1.40)
HCT: 26 % — ABNORMAL LOW (ref 39.0–52.0)
HCT: 27 % — ABNORMAL LOW (ref 39.0–52.0)
HCT: 27 % — ABNORMAL LOW (ref 39.0–52.0)
HCT: 28 % — ABNORMAL LOW (ref 39.0–52.0)
HCT: 29 % — ABNORMAL LOW (ref 39.0–52.0)
HCT: 29 % — ABNORMAL LOW (ref 39.0–52.0)
HCT: 30 % — ABNORMAL LOW (ref 39.0–52.0)
HCT: 30 % — ABNORMAL LOW (ref 39.0–52.0)
HCT: 31 % — ABNORMAL LOW (ref 39.0–52.0)
HCT: 31 % — ABNORMAL LOW (ref 39.0–52.0)
HCT: 32 % — ABNORMAL LOW (ref 39.0–52.0)
Hemoglobin: 10.2 g/dL — ABNORMAL LOW (ref 13.0–17.0)
Hemoglobin: 10.2 g/dL — ABNORMAL LOW (ref 13.0–17.0)
Hemoglobin: 10.5 g/dL — ABNORMAL LOW (ref 13.0–17.0)
Hemoglobin: 10.5 g/dL — ABNORMAL LOW (ref 13.0–17.0)
Hemoglobin: 10.9 g/dL — ABNORMAL LOW (ref 13.0–17.0)
Hemoglobin: 8.8 g/dL — ABNORMAL LOW (ref 13.0–17.0)
Hemoglobin: 9.2 g/dL — ABNORMAL LOW (ref 13.0–17.0)
Hemoglobin: 9.2 g/dL — ABNORMAL LOW (ref 13.0–17.0)
Hemoglobin: 9.5 g/dL — ABNORMAL LOW (ref 13.0–17.0)
Hemoglobin: 9.9 g/dL — ABNORMAL LOW (ref 13.0–17.0)
Hemoglobin: 9.9 g/dL — ABNORMAL LOW (ref 13.0–17.0)
O2 Saturation: 84 %
O2 Saturation: 84 %
O2 Saturation: 86 %
O2 Saturation: 87 %
O2 Saturation: 87 %
O2 Saturation: 89 %
O2 Saturation: 90 %
O2 Saturation: 91 %
O2 Saturation: 91 %
O2 Saturation: 91 %
O2 Saturation: 92 %
Patient temperature: 36
Patient temperature: 36.1
Patient temperature: 36.2
Patient temperature: 36.3
Patient temperature: 36.4
Patient temperature: 36.7
Patient temperature: 36.9
Patient temperature: 37
Patient temperature: 98.2
Potassium: 3.5 mmol/L (ref 3.5–5.1)
Potassium: 3.5 mmol/L (ref 3.5–5.1)
Potassium: 3.5 mmol/L (ref 3.5–5.1)
Potassium: 3.6 mmol/L (ref 3.5–5.1)
Potassium: 3.6 mmol/L (ref 3.5–5.1)
Potassium: 3.6 mmol/L (ref 3.5–5.1)
Potassium: 3.6 mmol/L (ref 3.5–5.1)
Potassium: 3.7 mmol/L (ref 3.5–5.1)
Potassium: 3.7 mmol/L (ref 3.5–5.1)
Potassium: 3.7 mmol/L (ref 3.5–5.1)
Potassium: 3.9 mmol/L (ref 3.5–5.1)
Sodium: 150 mmol/L — ABNORMAL HIGH (ref 135–145)
Sodium: 150 mmol/L — ABNORMAL HIGH (ref 135–145)
Sodium: 151 mmol/L — ABNORMAL HIGH (ref 135–145)
Sodium: 151 mmol/L — ABNORMAL HIGH (ref 135–145)
Sodium: 151 mmol/L — ABNORMAL HIGH (ref 135–145)
Sodium: 151 mmol/L — ABNORMAL HIGH (ref 135–145)
Sodium: 151 mmol/L — ABNORMAL HIGH (ref 135–145)
Sodium: 151 mmol/L — ABNORMAL HIGH (ref 135–145)
Sodium: 151 mmol/L — ABNORMAL HIGH (ref 135–145)
Sodium: 151 mmol/L — ABNORMAL HIGH (ref 135–145)
Sodium: 152 mmol/L — ABNORMAL HIGH (ref 135–145)
TCO2: 29 mmol/L (ref 22–32)
TCO2: 29 mmol/L (ref 22–32)
TCO2: 30 mmol/L (ref 22–32)
TCO2: 30 mmol/L (ref 22–32)
TCO2: 30 mmol/L (ref 22–32)
TCO2: 31 mmol/L (ref 22–32)
TCO2: 31 mmol/L (ref 22–32)
TCO2: 31 mmol/L (ref 22–32)
TCO2: 31 mmol/L (ref 22–32)
TCO2: 31 mmol/L (ref 22–32)
TCO2: 32 mmol/L (ref 22–32)
pCO2 arterial: 33.8 mmHg (ref 32.0–48.0)
pCO2 arterial: 36 mmHg (ref 32.0–48.0)
pCO2 arterial: 37.7 mmHg (ref 32.0–48.0)
pCO2 arterial: 38.5 mmHg (ref 32.0–48.0)
pCO2 arterial: 39.3 mmHg (ref 32.0–48.0)
pCO2 arterial: 40.3 mmHg (ref 32.0–48.0)
pCO2 arterial: 43.8 mmHg (ref 32.0–48.0)
pCO2 arterial: 44.4 mmHg (ref 32.0–48.0)
pCO2 arterial: 45 mmHg (ref 32.0–48.0)
pCO2 arterial: 45.7 mmHg (ref 32.0–48.0)
pCO2 arterial: 48.6 mmHg — ABNORMAL HIGH (ref 32.0–48.0)
pH, Arterial: 7.399 (ref 7.350–7.450)
pH, Arterial: 7.412 (ref 7.350–7.450)
pH, Arterial: 7.43 (ref 7.350–7.450)
pH, Arterial: 7.436 (ref 7.350–7.450)
pH, Arterial: 7.438 (ref 7.350–7.450)
pH, Arterial: 7.47 — ABNORMAL HIGH (ref 7.350–7.450)
pH, Arterial: 7.476 — ABNORMAL HIGH (ref 7.350–7.450)
pH, Arterial: 7.479 — ABNORMAL HIGH (ref 7.350–7.450)
pH, Arterial: 7.484 — ABNORMAL HIGH (ref 7.350–7.450)
pH, Arterial: 7.518 — ABNORMAL HIGH (ref 7.350–7.450)
pH, Arterial: 7.521 — ABNORMAL HIGH (ref 7.350–7.450)
pO2, Arterial: 40 mmHg — CL (ref 83.0–108.0)
pO2, Arterial: 47 mmHg — ABNORMAL LOW (ref 83.0–108.0)
pO2, Arterial: 48 mmHg — ABNORMAL LOW (ref 83.0–108.0)
pO2, Arterial: 48 mmHg — ABNORMAL LOW (ref 83.0–108.0)
pO2, Arterial: 51 mmHg — ABNORMAL LOW (ref 83.0–108.0)
pO2, Arterial: 53 mmHg — ABNORMAL LOW (ref 83.0–108.0)
pO2, Arterial: 53 mmHg — ABNORMAL LOW (ref 83.0–108.0)
pO2, Arterial: 55 mmHg — ABNORMAL LOW (ref 83.0–108.0)
pO2, Arterial: 58 mmHg — ABNORMAL LOW (ref 83.0–108.0)
pO2, Arterial: 58 mmHg — ABNORMAL LOW (ref 83.0–108.0)
pO2, Arterial: 60 mmHg — ABNORMAL LOW (ref 83.0–108.0)

## 2019-09-10 LAB — CULTURE, RESPIRATORY W GRAM STAIN

## 2019-09-10 LAB — URINALYSIS, ROUTINE W REFLEX MICROSCOPIC
Bilirubin Urine: NEGATIVE
Glucose, UA: NEGATIVE mg/dL
Hgb urine dipstick: NEGATIVE
Ketones, ur: NEGATIVE mg/dL
Leukocytes,Ua: NEGATIVE
Nitrite: NEGATIVE
Protein, ur: NEGATIVE mg/dL
Specific Gravity, Urine: 1.01 (ref 1.005–1.030)
pH: 5 (ref 5.0–8.0)

## 2019-09-10 LAB — CBC
HCT: 29.9 % — ABNORMAL LOW (ref 39.0–52.0)
HCT: 31.4 % — ABNORMAL LOW (ref 39.0–52.0)
Hemoglobin: 9 g/dL — ABNORMAL LOW (ref 13.0–17.0)
Hemoglobin: 9.7 g/dL — ABNORMAL LOW (ref 13.0–17.0)
MCH: 28.3 pg (ref 26.0–34.0)
MCH: 28.4 pg (ref 26.0–34.0)
MCHC: 30.1 g/dL (ref 30.0–36.0)
MCHC: 30.9 g/dL (ref 30.0–36.0)
MCV: 94 fL (ref 80.0–100.0)
MCV: 91.8 fL (ref 80.0–100.0)
Platelets: 136 10*3/uL — ABNORMAL LOW (ref 150–400)
Platelets: 161 10*3/uL (ref 150–400)
RBC: 3.18 MIL/uL — ABNORMAL LOW (ref 4.22–5.81)
RBC: 3.42 MIL/uL — ABNORMAL LOW (ref 4.22–5.81)
RDW: 18.9 % — ABNORMAL HIGH (ref 11.5–15.5)
RDW: 18.4 % — ABNORMAL HIGH (ref 11.5–15.5)
WBC: 18 10*3/uL — ABNORMAL HIGH (ref 4.0–10.5)
WBC: 18.4 10*3/uL — ABNORMAL HIGH (ref 4.0–10.5)
nRBC: 1.8 % — ABNORMAL HIGH (ref 0.0–0.2)
nRBC: 1.9 % — ABNORMAL HIGH (ref 0.0–0.2)

## 2019-09-10 LAB — COOXEMETRY PANEL
Carboxyhemoglobin: 4.1 % — ABNORMAL HIGH (ref 0.5–1.5)
Methemoglobin: 1.1 % (ref 0.0–1.5)
O2 Saturation: 70.9 %
Total hemoglobin: 9.2 g/dL — ABNORMAL LOW (ref 12.0–16.0)

## 2019-09-10 LAB — GLUCOSE, CAPILLARY
Glucose-Capillary: 111 mg/dL — ABNORMAL HIGH (ref 70–99)
Glucose-Capillary: 120 mg/dL — ABNORMAL HIGH (ref 70–99)
Glucose-Capillary: 122 mg/dL — ABNORMAL HIGH (ref 70–99)
Glucose-Capillary: 126 mg/dL — ABNORMAL HIGH (ref 70–99)
Glucose-Capillary: 147 mg/dL — ABNORMAL HIGH (ref 70–99)
Glucose-Capillary: 147 mg/dL — ABNORMAL HIGH (ref 70–99)

## 2019-09-10 LAB — LACTATE DEHYDROGENASE: LDH: 555 U/L — ABNORMAL HIGH (ref 98–192)

## 2019-09-10 LAB — APTT
aPTT: 121 seconds — ABNORMAL HIGH (ref 24–36)
aPTT: 106 seconds — ABNORMAL HIGH (ref 24–36)

## 2019-09-10 LAB — BASIC METABOLIC PANEL
Anion gap: 15 (ref 5–15)
Anion gap: 16 — ABNORMAL HIGH (ref 5–15)
BUN: 123 mg/dL — ABNORMAL HIGH (ref 6–20)
BUN: 116 mg/dL — ABNORMAL HIGH (ref 6–20)
CO2: 27 mmol/L (ref 22–32)
CO2: 28 mmol/L (ref 22–32)
Calcium: 9.9 mg/dL (ref 8.9–10.3)
Calcium: 9.6 mg/dL (ref 8.9–10.3)
Chloride: 107 mmol/L (ref 98–111)
Chloride: 108 mmol/L (ref 98–111)
Creatinine, Ser: 1.44 mg/dL — ABNORMAL HIGH (ref 0.61–1.24)
Creatinine, Ser: 1.49 mg/dL — ABNORMAL HIGH (ref 0.61–1.24)
GFR calc Af Amer: >60 mL/min (ref >60)
GFR calc Af Amer: >60 mL/min (ref >60)
GFR calc non Af Amer: 58 mL/min — ABNORMAL LOW (ref >60)
GFR calc non Af Amer: 56 mL/min — ABNORMAL LOW (ref >60)
Glucose, Bld: 124 mg/dL — ABNORMAL HIGH (ref 70–99)
Glucose, Bld: 152 mg/dL — ABNORMAL HIGH (ref 70–99)
Potassium: 3.9 mmol/L (ref 3.5–5.1)
Potassium: 3.7 mmol/L (ref 3.5–5.1)
Sodium: 149 mmol/L — ABNORMAL HIGH (ref 135–145)
Sodium: 152 mmol/L — ABNORMAL HIGH (ref 135–145)

## 2019-09-10 LAB — PREPARE RBC (CROSSMATCH)

## 2019-09-10 LAB — ANCA TITERS
Atypical P-ANCA titer: <1:20 {titer}
C-ANCA: <1:20 {titer}
P-ANCA: <1:20 {titer}

## 2019-09-10 LAB — HEPARIN LEVEL (UNFRACTIONATED)
Heparin Unfractionated: 0.45 IU/mL (ref 0.30–0.70)
Heparin Unfractionated: 0.34 IU/mL (ref 0.30–0.70)

## 2019-09-10 LAB — EXPECTORATED SPUTUM ASSESSMENT W GRAM STAIN, RFLX TO RESP C

## 2019-09-10 LAB — POCT I-STAT, CHEM 8
BUN: 119 mg/dL — ABNORMAL HIGH (ref 6–20)
Calcium, Ion: 1.29 mmol/L (ref 1.15–1.40)
Chloride: 107 mmol/L (ref 98–111)
Creatinine, Ser: 1.4 mg/dL — ABNORMAL HIGH (ref 0.61–1.24)
Glucose, Bld: 139 mg/dL — ABNORMAL HIGH (ref 70–99)
HCT: 26 % — ABNORMAL LOW (ref 39.0–52.0)
Hemoglobin: 8.8 g/dL — ABNORMAL LOW (ref 13.0–17.0)
Potassium: 3.6 mmol/L (ref 3.5–5.1)
Sodium: 151 mmol/L — ABNORMAL HIGH (ref 135–145)
TCO2: 29 mmol/L (ref 22–32)

## 2019-09-10 LAB — ECHO INTRAOPERATIVE TEE
Height: 68 in
Weight: 5319.26 oz

## 2019-09-10 LAB — FUNGAL ORGANISM REFLEX

## 2019-09-10 LAB — FIBRINOGEN: Fibrinogen: >800 mg/dL — ABNORMAL HIGH (ref 210–475)

## 2019-09-10 LAB — FUNGUS CULTURE WITH STAIN

## 2019-09-10 LAB — SURGICAL PATHOLOGY

## 2019-09-10 LAB — POCT ACTIVATED CLOTTING TIME
Activated Clotting Time: 166 seconds
Activated Clotting Time: 255 seconds

## 2019-09-10 LAB — FUNGUS CULTURE RESULT

## 2019-09-10 LAB — LACTIC ACID, PLASMA: Lactic Acid, Venous: 0.8 mmol/L (ref 0.5–1.9)

## 2019-09-10 SURGERY — ECHOCARDIOGRAM, TRANSESOPHAGEAL
Anesthesia: General

## 2019-09-10 MED ORDER — VANCOMYCIN HCL 1500 MG/300ML IV SOLN
1500.0000 mg | INTRAVENOUS | Status: AC
  Administered 2019-09-10: 1500 mg via INTRAVENOUS
  Filled 2019-09-10: qty 300

## 2019-09-10 MED ORDER — ROCURONIUM BROMIDE 100 MG/10ML IV SOLN
INTRAVENOUS | Status: DC | PRN
  Administered 2019-09-10: 80 mg via INTRAVENOUS
  Administered 2019-09-10 (×3): 100 mg via INTRAVENOUS

## 2019-09-10 MED ORDER — LACTATED RINGERS IV SOLN: INTRAVENOUS | Status: DC | PRN

## 2019-09-10 MED ORDER — FENTANYL CITRATE (PF) 250 MCG/5ML IJ SOLN
INTRAMUSCULAR | Status: AC
  Filled 2019-09-10: qty 5

## 2019-09-10 MED ORDER — SODIUM CHLORIDE 0.9% IV SOLUTION: Freq: Once | INTRAVENOUS | Status: AC

## 2019-09-10 MED ORDER — SODIUM CHLORIDE 0.9% IV SOLUTION: Freq: Once | INTRAVENOUS | Status: DC

## 2019-09-10 MED ORDER — MIDAZOLAM HCL 2 MG/2ML IJ SOLN
INTRAMUSCULAR | Status: AC
  Filled 2019-09-10: qty 2

## 2019-09-10 MED ORDER — HEPARIN SODIUM (PORCINE) 1000 UNIT/ML IJ SOLN
INTRAMUSCULAR | Status: DC | PRN
  Administered 2019-09-10: 10000 [IU] via INTRAVENOUS

## 2019-09-10 MED ORDER — FENTANYL CITRATE (PF) 250 MCG/5ML IJ SOLN
INTRAMUSCULAR | Status: DC | PRN
  Administered 2019-09-10 (×3): 100 ug via INTRAVENOUS
  Administered 2019-09-10: 150 ug via INTRAVENOUS
  Administered 2019-09-10 (×3): 100 ug via INTRAVENOUS

## 2019-09-10 MED ORDER — PROPOFOL 10 MG/ML IV BOLUS
INTRAVENOUS | Status: AC
  Filled 2019-09-10: qty 20

## 2019-09-10 MED ORDER — CEFAZOLIN SODIUM-DEXTROSE 2-4 GM/100ML-% IV SOLN
2.0000 g | Freq: Three times a day (TID) | INTRAVENOUS | Status: DC
  Administered 2019-09-10 – 2019-09-11 (×2): 2 g via INTRAVENOUS
  Filled 2019-09-10 (×3): qty 100

## 2019-09-10 MED ORDER — CEFUROXIME SODIUM 1.5 G IV SOLR
1.5000 g | INTRAVENOUS | Status: DC
  Filled 2019-09-10: qty 1.5

## 2019-09-10 MED ORDER — SODIUM CHLORIDE 0.9 % IV SOLN
INTRAVENOUS | Status: DC | PRN
  Administered 2019-09-10: 500 mL

## 2019-09-10 MED ORDER — MIDAZOLAM HCL 5 MG/5ML IJ SOLN
INTRAMUSCULAR | Status: DC | PRN
  Administered 2019-09-10 (×3): 2 mg via INTRAVENOUS

## 2019-09-10 MED ORDER — SODIUM CHLORIDE 0.9 % IV SOLN
INTRAVENOUS | Status: AC
  Filled 2019-09-10: qty 1.2

## 2019-09-10 MED ORDER — ROCURONIUM BROMIDE 10 MG/ML (PF) SYRINGE
PREFILLED_SYRINGE | INTRAVENOUS | Status: AC
  Filled 2019-09-10: qty 20

## 2019-09-10 MED ORDER — PRO-STAT SUGAR FREE PO LIQD
60.0000 mL | Freq: Three times a day (TID) | ORAL | Status: DC
  Administered 2019-09-11 – 2019-09-13 (×5): 60 mL
  Filled 2019-09-10 (×5): qty 60

## 2019-09-10 MED ORDER — PIVOT 1.5 CAL PO LIQD
1000.0000 mL | ORAL | Status: DC
  Filled 2019-09-10 (×6): qty 1000

## 2019-09-10 MED ORDER — TRANEXAMIC ACID 1000 MG/10ML IV SOLN
500.0000 mg | Freq: Once | INTRAVENOUS | Status: AC
  Administered 2019-09-10: 500 mg via TOPICAL
  Filled 2019-09-10: qty 10

## 2019-09-10 MED ORDER — ETOMIDATE 2 MG/ML IV SOLN
INTRAVENOUS | Status: AC
  Filled 2019-09-10: qty 10

## 2019-09-10 MED ORDER — HEPARIN SODIUM (PORCINE) 1000 UNIT/ML IJ SOLN
INTRAMUSCULAR | Status: AC
  Filled 2019-09-10: qty 1

## 2019-09-10 MED ORDER — POTASSIUM CHLORIDE 10 MEQ/50ML IV SOLN
10.0000 meq | INTRAVENOUS | Status: AC
  Administered 2019-09-10 (×4): 10 meq via INTRAVENOUS
  Filled 2019-09-10 (×3): qty 50

## 2019-09-10 MED ORDER — POTASSIUM CHLORIDE 10 MEQ/50ML IV SOLN
10.0000 meq | INTRAVENOUS | Status: DC
  Filled 2019-09-10: qty 50

## 2019-09-10 MED ORDER — SODIUM CHLORIDE 0.9 % IV SOLN
INTRAVENOUS | Status: DC | PRN
  Administered 2019-09-10: 1.5 g via INTRAVENOUS

## 2019-09-10 MED ORDER — ROCURONIUM BROMIDE 10 MG/ML (PF) SYRINGE
PREFILLED_SYRINGE | INTRAVENOUS | Status: AC
  Filled 2019-09-10: qty 10

## 2019-09-10 SURGICAL SUPPLY — 65 items
ANCHOR CATH FOLEY SECURE (MISCELLANEOUS) ×2 IMPLANT
APPLIER CLIP 13 LRG OPEN (CLIP)
APR CLP LRG 13 20 CLIP (CLIP)
BAG DECANTER FOR FLEXI CONT (MISCELLANEOUS) ×2 IMPLANT
BLADE CLIPPER SURG (BLADE) ×1 IMPLANT
BLADE SURG 11 STRL SS (BLADE) ×1 IMPLANT
CANNULA FEMORAL ART 14 SM (MISCELLANEOUS) IMPLANT
CATH DUAL LUMEN CRESCENT 32FR (CATHETERS) ×1 IMPLANT
CLIP APPLIE 13 LRG OPEN (CLIP) IMPLANT
CLIP VESOCCLUDE LG 6/CT (CLIP) ×2 IMPLANT
CLIP VESOCCLUDE MED LG 24/CT (CLIP) ×1 IMPLANT
CLIP VESOCCLUDE SM WIDE 24/CT (CLIP) ×1 IMPLANT
CONN 3/8X3/8 GISH STERILE (MISCELLANEOUS) ×1 IMPLANT
CONN 3/8X3/8 STRAIGHT W/LL (MISCELLANEOUS) ×1 IMPLANT
COVER BACK TABLE 60X90IN (DRAPES) ×1 IMPLANT
COVER PROBE W GEL 5X96 (DRAPES) ×2 IMPLANT
COVER SURGICAL LIGHT HANDLE (MISCELLANEOUS) ×1 IMPLANT
DRAPE C-ARM 42X72 X-RAY (DRAPES) ×2 IMPLANT
DRAPE CARDIOVASC SPLIT 84X147 (DRAPES) ×1 IMPLANT
DRAPE CARDIOVASCULAR INCISE (DRAPES)
DRAPE EXTREMITY BILATERAL (DRAPES) ×1 IMPLANT
DRAPE INCISE IOBAN 66X45 STRL (DRAPES) ×1 IMPLANT
DRAPE SLUSH/WARMER DISC (DRAPES) ×1 IMPLANT
DRAPE SRG 135X102X78XABS (DRAPES) ×1 IMPLANT
DRSG TEGADERM 4X4.5 CHG (GAUZE/BANDAGES/DRESSINGS) ×3 IMPLANT
ELECT CAUTERY BLADE 6.4 (BLADE) ×2 IMPLANT
ELECT REM PT RETURN 9FT ADLT (ELECTROSURGICAL) ×2
ELECTRODE REM PT RTRN 9FT ADLT (ELECTROSURGICAL) ×1 IMPLANT
FELT TEFLON 1X6 (MISCELLANEOUS) IMPLANT
FEMORAL VENOUS CANN RAP (CANNULA) IMPLANT
GLOVE BIO SURGEON STRL SZ7.5 (GLOVE) ×1 IMPLANT
GLOVE BIOGEL PI IND STRL 6.5 (GLOVE) IMPLANT
GLOVE BIOGEL PI IND STRL 7.0 (GLOVE) IMPLANT
GLOVE BIOGEL PI INDICATOR 6.5 (GLOVE) ×2
GLOVE BIOGEL PI INDICATOR 7.0 (GLOVE) ×1
GLOVE NEODERM STRL 7.5 LF PF (GLOVE) ×1 IMPLANT
GLOVE SURG NEODERM 7.5  LF PF (GLOVE) ×2
GOWN STRL REUS W/ TWL LRG LVL3 (GOWN DISPOSABLE) ×4 IMPLANT
GOWN STRL REUS W/TWL LRG LVL3 (GOWN DISPOSABLE) ×8
KIT BASIN OR (CUSTOM PROCEDURE TRAY) ×2 IMPLANT
KIT DILATOR VASC 18G NDL (KITS) ×1 IMPLANT
KIT VASCULAR ACCESS OPUS (MISCELLANEOUS) ×1 IMPLANT
NS IRRIG 1000ML POUR BTL (IV SOLUTION) ×3 IMPLANT
PACK GENERAL/GYN (CUSTOM PROCEDURE TRAY) ×2 IMPLANT
PAD ARMBOARD 7.5X6 YLW CONV (MISCELLANEOUS) ×2 IMPLANT
SCRUB POVIDONE IODINE 4 OZ (MISCELLANEOUS) ×2 IMPLANT
SET CANNULATION TOURNIQUET (MISCELLANEOUS) IMPLANT
SET MICROPUNCTURE 5F STIFF (MISCELLANEOUS) ×1 IMPLANT
SET PRESSURE INFUSION 24 (MISCELLANEOUS) ×1 IMPLANT
SHEATH AVANTI 11CM 5FR (SHEATH) ×2 IMPLANT
SOL PREP POV-IOD 4OZ 10% (MISCELLANEOUS) ×2 IMPLANT
STAPLER VISISTAT 35W (STAPLE) ×1 IMPLANT
SUT PROLENE 5 0 C 1 36 (SUTURE) IMPLANT
SUT PROLENE 6 0 C 1 30 (SUTURE) IMPLANT
SUT SILK  1 MH (SUTURE) ×5
SUT SILK 1 MH (SUTURE) ×6 IMPLANT
SUT SILK 2 0 SH CR/8 (SUTURE) ×1 IMPLANT
SYR 10ML LL (SYRINGE) ×1 IMPLANT
SYR 30ML LL (SYRINGE) ×2 IMPLANT
TOWEL GREEN STERILE (TOWEL DISPOSABLE) ×2 IMPLANT
TOWEL GREEN STERILE FF (TOWEL DISPOSABLE) ×2 IMPLANT
TRAY FOLEY MTR SLVR 16FR STAT (SET/KITS/TRAYS/PACK) ×1 IMPLANT
WATER STERILE IRR 1000ML POUR (IV SOLUTION) ×3 IMPLANT
WIRE EMERALD 3MM-J .035X150CM (WIRE) ×1 IMPLANT
WIRE J 3MM .035X145CM (WIRE) ×2 IMPLANT

## 2019-09-10 NOTE — Progress Notes (Signed)
RT Chest PT held at this time due to ray at bedside.

## 2019-09-10 NOTE — Progress Notes (Signed)
ANTICOAGULATION CONSULT NOTE - Follow Up Consult  Pharmacy Consult for Heparin Indication: ECMO  Patient Measurements: Height: 5' 8"  (172.7 cm) Weight: (!) 328 lb 7.8 oz (149 kg) IBW/kg (Calculated) : 68.4 Heparin Dosing Weight: 104.5kg  Vital Signs: Temp: 97.5 F (36.4 C) (01/21 1600) Temp Source: Axillary (01/21 1600) Pulse Rate: 87 (01/21 1700)  Labs: Recent Labs    09/09/19 0304 09/09/19 0320 09/09/19 1333 09/09/19 1347 09/09/19 1552 09/09/19 1600 09/09/19 1601 09/09/19 2332 09/09/19 2332 09/10/19 0407 09/10/19 0419  HGB 8.0*   < >  --    < > 8.4*  --    < > 8.8*   < > 9.0* 9.2*  HCT 27.9*   < >  --    < > 28.7*  --    < > 26.0*  --  29.9* 27.0*  PLT 126*  --   --   --  120*  --   --   --   --  136*  --   APTT 118*  --   --   --  111*  --   --   --   --  121*  --   HEPARINUNFRC 0.39   < > 0.39  --  0.39  --   --   --   --  0.45  --   CREATININE 1.28*  --   --   --   --  1.28*  --   --   --  1.44*  --    < > = values in this interval not displayed.    Assessment: 44yo male on VV ECMO on heparin infusion.   Heparin level this morning came back therapeutic at higher end of goal 0.45, on 2550 units/hr. Hgb 9.3, plt 136. Fibrinogen >800, LDH 555. No s/sx of bleeding or infusion issues. Nosebleed appears stable - packing removed 1/29 s/p TXA spray applied.    Goal of Therapy:  Heparin level 0.3-0.5 units/ml   Plan:  Reduce IV heparin to 2500 units/hr. Monitor heparin level and CBC 5AM/5PM, daily LDH Monitor for any increase in bleeding from mouth/nose  Antonietta Jewel, PharmD, [place] Pharmacist  Phone: [phone]  Please check AMION for all [place] phone numbers After 10:00 PM, call [place] [phone] 09/10/2019, 7:13 AM

## 2019-09-10 NOTE — Progress Notes (Addendum)
Cortrak Tube Team Note:  Consult received to place a Cortrak feeding tube.  Spoke with Dr Lynetta Mare and ECMO specialist at bedside. Pt continues to bleed from recent TEE. Plan has now changed to PEG placement and no cortrak placement for now.  Please re-consult as needed.    [place], [place], [place] Pager [phone] After Hours Pager

## 2019-09-10 NOTE — Progress Notes (Addendum)
ANTICOAGULATION CONSULT NOTE - Follow Up Consult  Pharmacy Consult for Heparin Indication: ECMO  Patient Measurements: Height: 5' 8"  (172.7 cm) Weight: (!) 328 lb 7.8 oz (149 kg) IBW/kg (Calculated) : 68.4 Heparin Dosing Weight: 104.5kg  Vital Signs: Temp: 97.5 F (36.4 C) (01/21 1600) Temp Source: Axillary (01/21 1600) Pulse Rate: 87 (01/21 1700)  Labs: Recent Labs    09/09/19 0304 09/09/19 0320 09/09/19 1552 09/09/19 1600 09/10/19 0407 09/10/19 0419 09/10/19 0928 09/10/19 0955 09/10/19 1313 09/10/19 1313 09/10/19 1517 09/10/19 1600 09/10/19 1607 09/10/19 1608  HGB 8.0*   < > 8.4*   < > 9.0*   < > 8.8*   < > 10.5*   < > 10.2*  --   --  10.2*  HCT 27.9*   < > 28.7*   < > 29.9*   < > 26.0*   < > 31.0*  --  30.0*  --   --  30.0*  PLT 126*  --  120*  --  136*  --   --   --   --   --   --   --   --   --   APTT 118*  --  111*  --  121*  --   --   --   --   --   --   --   --   --   HEPARINUNFRC 0.39   < > 0.39  --  0.45  --   --   --   --   --   --   --  0.34  --   CREATININE 1.28*  --   --    < > 1.44*  --  1.40*  --   --   --   --  1.49*  --   --    < > = values in this interval not displayed.    Assessment: 44yo male on VV ECMO on heparin infusion.   Heparin level this morning came back therapeutic at higher end of goal 0.45, on 2550 units/hr. Hgb 9.3, plt 136. Fibrinogen >800, LDH 555. No s/sx of bleeding or infusion issues. Nosebleed appears stable - packing removed 1/29 s/p TXA spray applied.    Repeat heparin level tonight is on lower end of therapeutic range at 0.34. Pt received bolus prior to circuit change, remains infusing at 2500 units/h. Nasal bleeding and some trach bleeding noted so will not increase heparin infusion rate for now since it is on lower end of therapeutic range.  Goal of Therapy:  Heparin level 0.3-0.5 units/ml   Plan:  -Continue heparin 2500 units/h -Recheck heparin level at [place], PharmD, BCPS Clinical  Pharmacist [phone] Please check AMION for all [name] numbers 09/10/2019

## 2019-09-10 NOTE — Progress Notes (Signed)
Patient was transported from the OR to 2H04 without any complications.

## 2019-09-10 NOTE — H&P (Signed)
History and Physical Interval Note:  09/10/2019 7:25 AM  [name]  has presented today for surgery, with the diagnosis of ARDS.  The various methods of treatment have been discussed with the patient and family. After consideration of risks, benefits and other options for treatment, the patient has consented to  Procedure(s) with comments: TRANSESOPHAGEAL ECHOCARDIOGRAM (TEE) (N/A) CANNULATION FOR ECMO (EXTRACORPOREAL MEMBRANE OXYGENATION) (N/A) - PUTTING IN CRESCENT/REMOVING GROIN CANNULATION as a surgical intervention.  The patient's history has been reviewed, patient examined, no change in status, stable for surgery.  I have reviewed the patient's chart and labs.  Questions were answered to the patient's satisfaction.     [place]

## 2019-09-10 NOTE — Progress Notes (Signed)
Patient ID: [name], male   DOB: 1976/06/08, 44 y.o.   MRN: [id number]    Advanced Heart Failure Rounding Note   Subjective:    Circuit changed and trach exchanged on 1/8.   Underwent addition of a RIJ drainage cannula on 1/20 Circuit changed on 1/22 for rising LDH Had evidence of recirculation and RIJ cannula pulled back on 1/26   Had good night. Sedation weaned down. Good diuresis on IV lasix. CXR improved after bronch removed clot yesterday.   Underwent change to [facility] catheter with removal of femoral lines this am  Remains on VV ecmo with good flows.   Sats in mid 80s-90  ABG 7.43/45/60/91%  Co-ox 71%   On vent now at 60% PEEP 15. PlatP 30, Bi-vent setting.TVs ~300cc Personally reviewed   hgb 10.5 wbc 18.0 -> 18.5K  ECMO parameters - see separate ecmo note   Objective:   Weight Range:  Vital Signs:   Temp:  [97.4 F (36.3 C)-97.9 F (36.6 C)] 97.9 F (36.6 C) (02/02 1714) Pulse Rate:  [63-162] 71 (02/02 1918) Resp:  [0-35] 22 (02/02 1918) BP: (118-163)/(48-57) 118/52 (02/02 1502) SpO2:  [80 %-98 %] 98 % (02/02 1918) Arterial Line BP: (111-192)/(40-80) 111/48 (02/02 1830) FiO2 (%):  [60 %-100 %] 60 % (02/02 1918) Weight:  [150.8 kg] 150.8 kg (02/02 0348) Last BM Date: 09/10/19  Weight change: Filed Weights   09/08/19 0500 09/09/19 0453 09/10/19 0348  Weight: (!) 149.6 kg (!) 148 kg (!) 150.8 kg    Intake/Output:   Intake/Output Summary (Last 24 hours) at 09/10/2019 1921 Last data filed at 09/10/2019 1714 Gross per 24 hour  Intake 3094.26 ml  Output 6515 ml  Net -3420.74 ml     Physical Exam: General: Sedated on vent. Bed inclined General HEENT: normal Neck: RIJ crescent cannula supple.. Carotids 2+ bilat; no bruits. No lymphadenopathy or thryomegaly appreciated. Cor: PMI nondisplaced. Regular rate & rhythm. No rubs, gallops or murmurs. Lungs: clear with markedly diminished air movement throughout Abdomen: obese  soft, nontender,  nondistended. No hepatosplenomegaly. No bruits or masses. Good bowel sounds. Extremities: no cyanosis, clubbing, rash, 2-3+ edema groin sites ok  Neuro: intubated sedated   Telemetry: Sinus 70-80s  Personally reviewed  Labs: Basic Metabolic Panel: Recent Labs  Lab 09/07/19 0433 09/07/19 0443 09/08/19 1631 09/08/19 1638 09/09/19 0304 09/09/19 0320 09/09/19 1600 09/09/19 1601 09/10/19 0407 09/10/19 0419 09/10/19 0928 09/10/19 0955 09/10/19 1517 09/10/19 1600 09/10/19 1608 09/10/19 1712 09/10/19 1838  NA 151*   < > 150*   < > 152*   < > 151*   < > 149*   < > 151*   < > 151* 152* 151* 151* 152*  K 4.2   < > 4.0   < > 3.9   < > 4.2   < > 3.9   < > 3.6   < > 3.7 3.7 3.6 3.7 3.7  CL 107   < > 110   < > 110  --  111  --  107  --  107  --   --  108  --   --   --   CO2 30   < > 27  --  28  --  27  --  27  --   --   --   --  28  --   --   --   GLUCOSE 163*   < > 223*   < > 221*  --  187*  --  124*  --  139*  --   --  152*  --   --   --   BUN 81*   < > 106*   < > 110*  --  120*  --  123*  --  119*  --   --  116*  --   --   --   CREATININE 0.99   < > 1.19   < > 1.28*  --  1.28*  --  1.44*  --  1.40*  --   --  1.49*  --   --   --   CALCIUM 9.4   < > 9.4   < > 9.5   < > 9.5  --  9.9  --   --   --   --  9.6  --   --   --   MG 2.8*  --   --   --   --   --   --   --   --   --   --   --   --   --   --   --   --    < > = values in this interval not displayed.    Liver Function Tests: No results for input(s): AST, ALT, ALKPHOS, BILITOT, PROT, ALBUMIN in the last 168 hours. No results for input(s): LIPASE, AMYLASE in the last 168 hours. No results for input(s): AMMONIA in the last 168 hours.  CBC: Recent Labs  Lab 09/08/19 1631 09/08/19 1638 09/09/19 0304 09/09/19 0320 09/09/19 1552 09/09/19 1601 09/10/19 0407 09/10/19 0419 09/10/19 1517 09/10/19 1608 09/10/19 1712 09/10/19 1835 09/10/19 1838  WBC 12.0*  --  12.2*  --  11.4*  --  18.0*  --   --   --   --  18.4*  --   HGB 8.4*    < > 8.0*   < > 8.4*   < > 9.0*   < > 10.2* 10.2* 10.9* 9.7* 10.5*  HCT 28.5*   < > 27.9*   < > 28.7*   < > 29.9*   < > 30.0* 30.0* 32.0* 31.4* 31.0*  MCV 95.3  --  96.9  --  97.0  --  94.0  --   --   --   --  91.8  --   PLT 123*  --  126*  --  120*  --  136*  --   --   --   --  161  --    < > = values in this interval not displayed.    Cardiac Enzymes: No results for input(s): CKTOTAL, CKMB, CKMBINDEX, TROPONINI in the last 168 hours.  BNP: BNP (last 3 results) Recent Labs    07/22/19 1039  BNP 15.3    ProBNP (last 3 results) No results for input(s): PROBNP in the last 8760 hours.    Other results:  Imaging: DG Chest 1 View  Result Date: 09/10/2019 CLINICAL DATA:  History of heart surgery.  ECMO. EXAM: CHEST  1 VIEW COMPARISON:  Prior study same day.  Prior study 09/09/2019. FINDINGS: Endotracheal tube, left subclavian left, right PICC line, bilateral chest tubes in stable position. ECMO device noted projected over the right chest. Cardiomegaly. Diffuse severe bilateral pulmonary infiltrates are again noted. No prominent pleural effusion noted. No pneumothorax. IMPRESSION: 1. Lines and tubes as above. Bilateral chest tubes in stable position. No pneumothorax. 2.  Stable cardiomegaly. 3.  Diffuse unchanged dense bilateral pulmonary infiltrates/edema. Electronically Signed  By: [place]   On: 09/10/2019 11:02   DG CHEST PORT 1 VIEW  Result Date: 09/10/2019 CLINICAL DATA:  Central line placement. EXAM: PORTABLE CHEST 1 VIEW COMPARISON:  Same day. FINDINGS: Stable cardiomediastinal silhouette. Tracheostomy tube is unchanged in position. Left subclavian catheter is unchanged. Bilateral chest tubes are noted without pneumothorax. Stable bilateral diffuse lung opacities are noted most consistent with multifocal pneumonia or edema. Bony thorax is unremarkable. IMPRESSION: 1. Stable support apparatus. Stable bilateral lung opacities as described above. 2. No evidence of pneumothorax.  Electronically Signed   By: Marijo Conception M.D.   On: 09/10/2019 15:15   DG CHEST PORT 1 VIEW  Result Date: 09/10/2019 CLINICAL DATA:  Acute respiratory failure with hypoxia. EXAM: PORTABLE CHEST 1 VIEW COMPARISON:  Chest x-ray 09/09/2019 FINDINGS: The tracheostomy tube is in good position, unchanged. The feeding tube is coursing down the esophagus and into the stomach. Right PICC line is stable. Right-sided IVC catheter is stable. Bilateral chest tubes are stable. Persistent significant diffuse airspace process. No definite pleural effusions. IMPRESSION: 1. Stable support apparatus. 2. Persistent significant diffuse airspace process. Electronically Signed   By: Marijo Sanes M.D.   On: 09/10/2019 06:40   DG CHEST PORT 1 VIEW  Result Date: 09/09/2019 CLINICAL DATA:  Acute respiratory failure, ECMO EXAM: PORTABLE CHEST 1 VIEW COMPARISON:  09/09/2019 at 5:04 a.m. FINDINGS: Two frontal views of the chest are obtained. Tracheostomy tube, enteric catheter, left subclavian catheter, bilateral chest tubes, and IVC catheter unchanged. Persistent widespread airspace disease throughout the lungs in this patient on ECMO. Interval development of trace left apical pneumothorax, volume estimate of far less than 5%. No evidence for effusion. IMPRESSION: 1. Interval development of trace left apical pneumothorax volume estimated far less than 5%. 2. Otherwise stable exam. These results will be called to the ordering clinician or representative by the Radiologist Assistant, and communication documented in the PACS or zVision Dashboard. Electronically Signed   By: Randa Ngo M.D.   On: 09/09/2019 14:17   DG CHEST PORT 1 VIEW  Result Date: 09/09/2019 CLINICAL DATA:  Cardia respiratory failure EXAM: PORTABLE CHEST 1 VIEW COMPARISON:  Yesterday FINDINGS: Widespread dense pulmonary opacification in this patient on ECMO. The IVC cannula is in stable position. Bilateral central lines in stable place. Tracheostomy tube and  feeding tube in place. Bilateral chest tube in stable position with no visible pneumothorax. IMPRESSION: Stable hardware positioning and pulmonary opacity. No visible pneumothorax. Electronically Signed   By: Monte Fantasia M.D.   On: 09/09/2019 06:01   ECHO INTRAOPERATIVE TEE  Result Date: 09/10/2019  *INTRAOPERATIVE TRANSESOPHAGEAL REPORT *  Patient Name:   AVROHOM MCKELVIN Date of Exam: 09/10/2019 Medical Rec #:  [id number]      Height:       68.0 in Accession #:    [id number]     Weight:       332.5 lb Date of Birth:  1976/05/07      BSA:          2.54 m Patient Age:    74 years       BP:           128/48 mmHg Patient Gender: M              HR:           81 bpm. Exam Location:  Anesthesiology Transesophogeal exam was perform intraoperatively during surgical procedure. Patient was closely monitored under general anesthesia during the entirety of  examination. Indications:     CANNULATION FOR ECMO Performing Phys: [id number] Z ATKINS Diagnosing Phys: Roberts Gaudy MD Complications: No known complications during this procedure. PRE-OP FINDINGS  Left Ventricle: The LV cavity was normal in size with vigorus LV contractility. The ejection fraction was estimated at 65-70% by visual inspection.here was normal LV wall thickness. Right Ventricle: The right ventricle has normal systolic function. The cavity was mildly enlarged. There is no increase in right ventricular wall thickness. The RV cavity was moderately enlarged. There was normal RV systolic function. There was interventricular septal flattening throughout systole consistent with elevated RV pressures. Left Atrium: Left atrial size was not assessed. The left atrial appendage is well visualized and there is no evidence of thrombus present. Right Atrium: Upon initial examination, the ECMO outfow cannula was abutting the interatrial septum with tubulent flow contacting the interatrial septum.. There was no evidence of patent foramen ovale. Following removal of the  femoral VV ECMO cannulae, and insertion of the internal jugular ECMO cannula, the cannula could be visualized entering the RA from the superior vena cava and extending into the inferior vena cava. The inflow ports of the cannula could not be visualized with color flow Doppler. The ECMO outflow could be visualized in the RA with flow was directed towrds the interatrial septum and tracting inferiorly towrds the tricuspid valve. The internal jugular cannula was not abutting the interatrial septum as was noted with the femoral outflow cannula. Interatrial Septum: No atrial level shunt detected by color flow Doppler. Pericardium: There is no evidence of pericardial effusion. Mitral Valve: The mitral valve is normal in structure. Mild thickening of the mitral valve leaflet. Mitral valve regurgitation is mild by color flow Doppler. The MR jet is centrally-directed. Tricuspid Valve: The tricuspid valve was normal in structure. Tricuspid valve regurgitation is mild-moderate by color flow Doppler. The jet is directed centrally. Aortic Valve: The aortic valve is tricuspid There is mild thickening of the aortic valve. Aortic valve regurgitation was not visualized by color flow Doppler. There is no evidence of aortic valve stenosis. There is no evidence of a vegetation on the aortic valve. Pulmonic Valve: The pulmonic valve was not assessed. Pulmonic valve regurgitation is not visualized by color flow Doppler.  Roberts Gaudy MD Electronically signed by Roberts Gaudy MD Signature Date/Time: 09/10/2019/6:02:26 PM    Final    HYBRID OR IMAGING ([place])  Result Date: 09/10/2019 There is no interpretation for this exam.  This order is for images obtained during a surgical procedure.  Please See "Surgeries" Tab for more information regarding the procedure.     Medications:     Scheduled Medications: . sodium chloride   Intravenous Once  . vitamin C  500 mg Per Tube Daily  . chlorhexidine gluconate (MEDLINE KIT)  15 mL Mouth  Rinse BID  . Chlorhexidine Gluconate Cloth  6 each Topical Daily  . diazepam  20 mg Per Tube Q6H  . feeding supplement (PRO-STAT SUGAR FREE 64)  60 mL Per Tube TID  . free water  300 mL Per Tube Q8H  . gabapentin  200 mg Per Tube Q8H  . HYDROmorphone  16 mg Per Tube Q4H  . insulin aspart  0-20 Units Subcutaneous Q4H  . insulin detemir  20 Units Subcutaneous BID  . ipratropium-albuterol  3 mL Nebulization Q4H  . mouth rinse  15 mL Mouth Rinse 10 times per day  . montelukast  10 mg Per Tube QHS  . oxymetazoline  1 spray Each  Nare BID  . pantoprazole sodium  40 mg Per Tube QHS  . propranolol  20 mg Per Tube TID  . QUEtiapine  50 mg Per Tube BID  . sodium chloride flush  10-40 mL Intracatheter Q12H  . Thrombi-Pad  1 each Topical Once  . Thrombi-Pad  1 each Topical Once  . white petrolatum   Topical BID  . zinc sulfate  220 mg Per Tube Daily    Infusions: . sodium chloride    . sodium chloride Stopped (09/05/19 1900)  . sodium chloride Stopped (09/10/19 0736)  . albumin human Stopped (09/09/19 0518)  .  ceFAZolin (ANCEF) IV    . dexmedetomidine (PRECEDEX) IV infusion 1.2 mcg/kg/hr (09/10/19 1801)  . feeding supplement (PIVOT 1.5 CAL)    . furosemide (LASIX) infusion 8 mg/hr (09/10/19 1700)  . heparin 2,500 Units/hr (09/10/19 1700)  . HYDROmorphone 5 mg/hr (09/10/19 1554)  . midazolam Stopped (09/09/19 1142)  . norepinephrine 10 mcg/min (09/10/19 1555)  . potassium chloride 10 mEq (09/10/19 1837)  . vasopressin (PITRESSIN) infusion - *FOR SHOCK* 0.03 Units/min (09/07/19 1033)    PRN Medications: Place/Maintain arterial line **AND** sodium chloride, sodium chloride, sodium chloride, acetaminophen (TYLENOL) oral liquid 160 mg/5 mL, acetaminophen, albumin human, hydrALAZINE, HYDROmorphone, magic mouthwash, magnesium hydroxide, midazolam, ondansetron (ZOFRAN) IV, senna-docusate, sodium chloride   Assessment/Plan:    1. Acute hypoxic/hypercarbic respiratory failure due to COVID  PNA: Remained markedly hypoxic despite full support. Intubated 12/26. S/p bilateral CTs 12/26 for pneumothoraces.  TEE on 12/26 LVEF 70% RV ok. VV ECMO begun 12/26. Tracheostomy 12/29.  COVID ARDS at this point. ECMO circuit changed 1/8 & 1/23. Crescent cannula placed 2/2 (femoral cannuals removed) - ECMO parameters as per ECMO note - Flows look very good on Crescent - Able to come down on sedation overnight. Will continue wean (GENTLY) as tolerated - CXR improved with removal of bloody plug on bronch yesterday - LDH 480 -> 412 -> 445 -> 432 -> 450 -> 481 -> 508 -> 557 -> 555 Watch closely - Continue heparin. Discussed dosing with PharmD personally. - Continue lasix gtt - Bronch today with worsening CXR - Continue chest PT - Hopefully can start to wean sweep gas soon   2. Bilateral PTX: Due to barotrauma.  - Driving pressure minimized at < 15 Stable. No change  3. COVID PNA: management of COVID infection per CCM. CXR with bilateral multifocal PNA progressing to likely ARDS.  - s/p 10 days of remdesivir - 12/16, 12/2 s/p tociluzimab - 12/17 s/p convalescent plasma - Decadron at 70m/daily completed 1/11 - Completed cefepime/vanc - On Ancef for nasal packing/sinusitis  4. ID: Empiric coverage with vancomycin/cefepime initially for possible secondary bacterial PNA, course completed.   - treated with cefepime and vanc for + cx with staph epidimidis, MSSA and serratia (sensitivities ok). - On Ancef for nasal packing - WBC rising. I will redraw cultures. Low threshold to expand abx.   5. Thrombocytopenia: Slow decrease in platelets, likely low level hemolysis + critical illness.  - resolved   6. Anemia: Bleeding at trach site resolved but still with epistaxis - hgb 10.5  today - Transfuse hgb < 8  7. FEN - continue TFs via oral Cor-trak  8.Dilated R pupil - head CT negative  9. Hypernatremia - NA 155-> 152. Continue FW flushes  D/w with ECMO team (ECMO coordinator/specialists,  CCM, TCTS and pharmD) at bedside on multidisciplinary rounds. Continue to await ARDS recovery. Details as above.   Length of Stay: 50  CRITICAL CARE Performed by: Glori Bickers  Total critical care time: 45 minutes  Critical care time was exclusive of separately billable procedures and treating other patients.  Critical care was necessary to treat or prevent imminent or life-threatening deterioration.  Critical care was time spent personally by me (independent of midlevel providers or residents) on the following activities: development of treatment plan with patient and/or surrogate as well as nursing, discussions with consultants, evaluation of patient's response to treatment, examination of patient, obtaining history from patient or surrogate, ordering and performing treatments and interventions, ordering and review of laboratory studies, ordering and review of radiographic studies, pulse oximetry and re-evaluation of patient's condition.    Glori Bickers MD 09/10/2019, 7:21 PM  Advanced Heart Failure Team Pager [phone] (M-F; 7a - 4p)  Please contact [facility] for night-coverage after hours (4p -7a ) and weekends on [email or web address]

## 2019-09-10 NOTE — Progress Notes (Signed)
  Echocardiogram Echocardiogram Transesophageal has been performed.  Shera Laubach A Hakop Humbarger 09/10/2019, 12:08 PM

## 2019-09-10 NOTE — Progress Notes (Signed)
NAME:  [name], MRN:  [id number], DOB:  June 23, 1976, LOS: 48 ADMISSION DATE:  07/22/2019, CONSULTATION DATE:  12/24 REFERRING MD:  Sloan Leiter, CHIEF COMPLAINT:  Dyspnea   Brief History   44 y/o M admitted 12/14 with COVID pneumonia causing acute hypoxemic respiratory failure. Developed pneumomediastinum 12/23 with concern for possible bilateral small pneumothoraces on 12/24.  PCCM consulted for evaluation of pneumothoraces  Past Medical History  GERD HTN Asthma  Significant Hospital Events   12/14 admit with hypoxemic respiratory failure in setting of COVID-19 pneumonia 12/23 noted to have pneumomediastinum on chest x-ray 12/24 PCCM consulted for evaluation 12/26 worsening hypoxemia, intubated  12/27 VV fem/fem ECMO cannulation See bedside nursing event notes for full rundown of procedures after 12/27 1/30 improved cxr, improved lung compliance, TV 350s on 15PC  1/31 CXR increased infiltrates, +CFB     2/2 Converted to right IJ Crescent cannula.  Consults:  PCCM, CTS, CHF, Palliative  Procedures:  See bedside nursing event notes for full rundown of procedures.  Significant Diagnostic Tests:   CT chest 12/22 >> extensive pneumomediastinum, multifocal patchy bilateral groundglass opacities CXR 1/25 >> improving bilateral infiltrates. ECMO cannulae in place. CXR 2/2  Micro Data:  BCx2 12/14 >> negative  Sputum 12/15 >> negative  1/9 bronch: serratia and MSSA 1/2 acid fast smear bronch: negative 1/2 fungal cx bronch: pending 1/12 BAL: serratia 1/18 BAL >> rare Serratia.  Antimicrobials:  Received remdesivir, steroids, actemra and convalescent plasma  5 days cefepime 12/24-12/29 Cefepime 1/4->1/14 vanc 1/4->1/14 Ceftriaxone 1/14 >>   Interim history/subjective:   Improved compliance with ventilation and ECLS with increased enteral sedation. Infusions off. Enteral nutrition stopped as well on suspicion of overfeeding which might have raised RQ. Oxygenation also  improved with control of AF.  Marked re-circulation.  Tolerated conversion to IJ Crescent catheter.  Objective   Blood pressure (!) 118/52, pulse 73, temperature (!) 97.4 F (36.3 C), temperature source Oral, resp. rate 14, height _0  (1.727 m), weight (!) 150.8 kg, SpO2 95 %. CVP:  [9 mmHg] 9 mmHg  Vent Mode: PCV FiO2 (%):  [60 %-100 %] 80 % Set Rate:  [20 bmp] 20 bmp PEEP:  [20 cmH20] 20 cmH20 Plateau Pressure:  [29 cmH20-35 cmH20] 34 cmH20   Intake/Output Summary (Last 24 hours) at 09/10/2019 1544 Last data filed at 09/10/2019 1535 Gross per 24 hour  Intake 3050.08 ml  Output 6465 ml  Net -3414.92 ml   Filed Weights   09/08/19 0500 09/09/19 0453 09/10/19 0348  Weight: (!) 149.6 kg (!) 148 kg (!) 150.8 kg   Examination General appearance: 44 y.o., male, trach in place, reverse Trendelenburg, Eyes: Opens eyes periodically. HENT: pupils equal and reactive. No further epistaxis. Minimal bleeding from tracheostomy Lungs: abdominal breathing. Bronchial breath sounds left lung and vesicular on right anteriorly. Bronchial from anterior axillary line back bilaterally.  Chest: BL chest tubes, tidaling with air leak.  CV: RRR, s1 s2, no MRG  Abdomen: soft, nt, nd Extremities: diffuse anasarca. Now largely in dependent areas. Unna boots to both legs. Skin: anterior chest folliculitis Neuro: no response to painful stimuli   Performed bedside echo which showed normal LV function. RV still dilated but contractility has improved and septal dyskinesis is no longer evident. More obvious TR with RVSP of at least 35mHg. Cannula tip not seen in RA and return jet not as noticeable.  CXR - BL infiltrates unchanged.. The patient's images have been independently reviewed by me.    CT head  1/28: pansinusitis. CT chest 1/28: Dense bilateral consolidation in dependent lungs both sides CT abdomen 1/28: Cannulae in position in IVC.  Flow (LPM): 10, Flow (LPM): 5.16 ECMO Mode: VV ECMO Device:  Cardiohelp  $ Ventilator Initial/Subsequent : Subsequent, Vent Mode: PCV, Set Rate: 20 bmp, FiO2 (%): 80 %, I Time: 0.8 Sec(s), PEEP: 20 cmH20 Vt today 277m  Net IO Since Admission: 43,492.59 mL [09/10/19 1544]  Resolved Hospital Problem list   MSSA and serratia marcescens pneumonia   Assessment & Plan:   Acute hypoxemic respiratory failure with ARDS secondary to COVID-19, status post tracheostomy on mechanical ventilation and VV ECMO support.  Slowly resolving ARDS. Limited respiratory reserve with persistently high burden of disease, in keeping with natural history of COVID related ARDS.  - Keep off enteral sedation - Continue to diurese aggressively for now. Decreasing edema, now largely dependent. Slow if creatinine starts to rise further.  -Continue current ECMO support in combination with ultra lung-protective ventilation.  -Periodic bronchoscopy - will bronch today.  -Increase frequency of chest physiotherapy back to q4h. - Added milrinone for RV support. - Convert to two-stage cannula to limit recirculation - Resume feeding at lower caloric goal rate, maintain protein intake.  Posterior epistaxis appears to have resolved following a period of anticoagulation interruption. Continue nose/nare care Can use oxymetazoline/txa again if needed   Episode of AF. Currently on amiodarone. - discontinue as has now received adequate load. At risk for amiodarone lung toxicity.  Hypernatremia - FW 2064mvia tube.  Daily Goals Checklist   Pain/Anxiety/Delirium protocol (if indicated): bolus IV sedation only. VAP protocol (if indicated): Bundle in place. Respiratory support goals: Pressure control ventilation with ultra protective lung ventilation. Titrate FiO2 down again. Continue PEEP 20  Blood pressure target:  MAP>65, Continue NE. Hold amiodarone and milrinone for now. DVT prophylaxis: Remains on systemic heparin - target Heparin level 0.3-0.7 Nutrition Status: Nutrition Problem:  Increased nutrient needs Etiology: acute illness(COVID PNA) Signs/Symptoms: estimated needs Interventions: Tube feeding, Prostat GI prophylaxis: Protonix per tube. Fluid status goals: Continue furosemide infusion for approximately 1.5 L net negative per day - may be approaching limit. Urinary catheter: Guide hemodynamic management Glucose control: Euglycemic control with SSI Mobility/therapy needs: Bedrest.  Daily labs: as per ECMO protocol. Code Status: Full code  Family Communication:family updated by RN. Disposition: ICU  Labs   CBC: CBC Latest Ref Rng & Units 09/10/2019 09/10/2019 09/10/2019  WBC 4.0 - 10.5 K/uL - - -  Hemoglobin 13.0 - 17.0 g/dL 8.8(L) 8.8(L) 9.5(L)  Hematocrit 39.0 - 52.0 % 26.0(L) 26.0(L) 28.0(L)  Platelets 150 - 400 K/uL - - -    Basic Metabolic Panel: BMP Latest Ref Rng & Units 09/10/2019 09/10/2019 09/10/2019  Glucose 70 - 99 mg/dL - 139(H) -  BUN 6 - 20 mg/dL - 119(H) -  Creatinine 0.61 - 1.24 mg/dL - 1.40(H) -  Sodium 135 - 145 mmol/L 150(H) 151(H) 151(H)  Potassium 3.5 - 5.1 mmol/L 3.6 3.6 3.6  Chloride 98 - 111 mmol/L - 107 -  CO2 22 - 32 mmol/L - - -  Calcium 8.9 - 10.3 mg/dL - - -    GFR: Estimated Creatinine Clearance: 95.6 mL/min (A) (by C-G formula based on SCr of 1.4 mg/dL (H)). Recent Labs  Lab 09/07/19 0433 09/07/19 1111 09/08/19 0245 09/08/19 0245 09/08/19 1631 09/09/19 0304 09/09/19 1552 09/10/19 0407  WBC 12.4*   < > 10.8*   < > 12.0* 12.2* 11.4* 18.0*  LATICACIDVEN 1.2  --  1.3  --   --  1.5  --  0.8   < > = values in this interval not displayed.    Liver Function Tests: No results for input(s): AST, ALT, ALKPHOS, BILITOT, PROT, ALBUMIN in the last 168 hours. No results for input(s): LIPASE, AMYLASE in the last 168 hours. No results for input(s): AMMONIA in the last 168 hours.  ABG    Component Value Date/Time   PHART 7.484 (H) 09/10/2019 0955   PCO2ART 37.7 09/10/2019 0955   PO2ART 47.0 (L) 09/10/2019 0955   HCO3 28.3 (H)  09/10/2019 0955   TCO2 29 09/10/2019 0955   ACIDBASEDEF 2.0 08/28/2019 1519   O2SAT 86.0 09/10/2019 0955     Coagulation Profile: No results for input(s): INR, PROTIME in the last 168 hours.  Cardiac Enzymes: No results for input(s): CKTOTAL, CKMB, CKMBINDEX, TROPONINI in the last 168 hours.  HbA1C: Hgb A1c MFr Bld  Date/Time Value Ref Range Status  07/24/2019 04:50 AM 6.7 (H) 4.8 - 5.6 % Final    Comment:    (NOTE) Pre diabetes:          5.7%-6.4% Diabetes:              >6.4% Glycemic control for   <7.0% adults with diabetes   09/30/2017 05:36 AM 5.7 (H) 4.8 - 5.6 % Final    Comment:    (NOTE)         Prediabetes: 5.7 - 6.4         Diabetes: >6.4         Glycemic control for adults with diabetes: <7.0     CBG: Recent Labs  Lab 09/09/19 1129 09/09/19 1559 09/09/19 1957 09/09/19 2329 09/10/19 0416  GLUCAP 228* 175* 139* 126* 111*   CRITICAL CARE Performed by: Kipp Brood   Total critical care time: 50 minutes  Critical care time was exclusive of separately billable procedures and treating other patients.  Critical care was necessary to treat or prevent imminent or life-threatening deterioration.  Critical care was time spent personally by me on the following activities: development of treatment plan with patient and/or surrogate as well as nursing, discussions with consultants, evaluation of patient's response to treatment, examination of patient, obtaining history from patient or surrogate, ordering and performing treatments and interventions, ordering and review of laboratory studies, ordering and review of radiographic studies, pulse oximetry, re-evaluation of patient's condition and participation in multidisciplinary rounds.  Kipp Brood, MD [facility] ICU Physician [place]  Pager: [phone] Mobile: [phone] After hours: [phone].   09/10/2019 3:44 PM

## 2019-09-10 NOTE — Transfer of Care (Signed)
Immediate Anesthesia Transfer of Care Note  Patient: [name]  Procedure(s) Performed: TRANSESOPHAGEAL ECHOCARDIOGRAM (TEE) (N/A ) CANNULATION FOR ECMO (EXTRACORPOREAL MEMBRANE OXYGENATION) PUTTING IN CRESCENT 32FR CANNULA  AND REMOVING GROING CANNULATION (N/A )  Patient Location: ICU  Anesthesia Type:General  Level of Consciousness: Patient remains intubated per anesthesia plan  Airway & Oxygen Therapy: Patient placed on Ventilator (see vital sign flow sheet for setting)  Post-op Assessment: Report given to RN and Post -op Vital signs reviewed and stable  Post vital signs: Reviewed  Last Vitals:  Vitals Value Taken Time  BP    Temp    Pulse 77 09/10/19 1035  Resp 18 09/10/19 1035  SpO2 81 % 09/10/19 1035  Vitals shown include unvalidated device data.  Last Pain:  Vitals:   09/09/19 1600  TempSrc: Core  PainSc:       Patients Stated Pain Goal: 0 (70/34/03 5248)  Complications: No apparent anesthesia complications

## 2019-09-10 NOTE — Progress Notes (Addendum)
Bolus of 10,000 units of Heparin at 0913 was given before circuit change. RIJ crescent cannula was placed. After successful flow in RIJ, femoral catheters were discontinued.    ECMO Tubing lot# [id number] Oxy Lot: [id number]

## 2019-09-10 NOTE — CV Procedure (Signed)
ECMO NOTE:  Indication: Respiratory failure due to COVID PNA  Initial cannulation date: 08/03/2019  ECMO type: VV ECMO (Cardio-Help)  Dual lumen  Inflow/return cannula:  1) 32 FR Crescent placed 2/2  ECMO events:  Initial cannulation 12/26 Circuit changed and trach exchanged on 1/8.   Underwent addition of a RIJ drainage cannula on 1/20 Circuit changed on 1/22 for rising LDH Had evidence of recirculation and RIJ cannula pulled back on 1/26 19 FR RIJ removed 09/06/19 Underwent change to [facility] catheter with removal of femoral lines 2/2  Daily data:  Remains on VV ecmo with good flows.   Flow 5.17 RPM 3700 dP 34 Pven  -103 Sweep 15  SVO2 70.9%  Labs:  Hgb 10.5 LDH 450 -> 481 -> 508 -> 557 -> 555 Heparin level: 0.34  Plan:  Crescent cannula placed today. Excellent flows. Sedation being weaned. Continue diuresis Draw cultures Wean sweep gas as tolerated  Glori Bickers, MD  7:31 PM

## 2019-09-10 NOTE — Anesthesia Postprocedure Evaluation (Signed)
Anesthesia Post Note  Patient: [name]  Procedure(s) Performed: TRANSESOPHAGEAL ECHOCARDIOGRAM (TEE) (N/A ) CANNULATION FOR ECMO (EXTRACORPOREAL MEMBRANE OXYGENATION) PUTTING IN CRESCENT 32FR CANNULA  AND REMOVING GROING CANNULATION (N/A )     Patient location during evaluation: SICU Anesthesia Type: General Level of consciousness: sedated and patient remains intubated per anesthesia plan Pain management: pain level controlled Vital Signs Assessment: post-procedure vital signs reviewed and stable Respiratory status: patient remains intubated per anesthesia plan and patient on ventilator - see flowsheet for VS Cardiovascular status: stable Anesthetic complications: no    Last Vitals:  Vitals:   09/10/19 1535 09/10/19 1600  BP:    Pulse: 73 74  Resp: 14 10  Temp: (!) 36.3 C   SpO2: 95% 94%    Last Pain:  Vitals:   09/10/19 1535  TempSrc: Oral  PainSc:                  Joanann Mies COKER

## 2019-09-10 NOTE — OR Nursing (Signed)
New ECMO circuit initiated at this time. Patient came off old ECMO circuit and on new ECMO circuit at 0918 on 09/10/2019. ECMO will continue to be monitored by [facility] ECMO specialist Misha Ferolito at this time.

## 2019-09-10 NOTE — Progress Notes (Addendum)
Nutrition Follow-up  DOCUMENTATION CODES:   Obesity unspecified  INTERVENTION:    Tube Feeding:  Pivot 1.5 at 65 ml/hr Pro-Stat 60 mL TID Provides 236 g of protein, 2940 kcals  1186 mL of free water  TF at new goal ONLY provides 1186 mL of free water (previous goal rate of 75 ml/hr only provided 1368 mL of free water)  Pt ordered to receive free water 300 mL q 8 hours: additional 900 mL of free water per day  Total Estimated Free water deficit of 5 L based on EDW 121 kg (lowest weight since admit), higher if pt weight higher. Free water flushes per MD at present  Consider stopping Vitamin C and zinc sulfate; pt has been receiving >1 month. At least consider checking Vitamin C and Zinc levels, if normal then d/c. Consider checking copper level as well   NUTRITION DIAGNOSIS:   Increased nutrient needs related to acute illness(COVID PNA) as evidenced by estimated needs.  Being addressed via TF   GOAL:   Patient will meet greater than or equal to 90% of their needs  Being addressed via TF   MONITOR:   Diet advancement, Skin, TF tolerance, Weight trends, Labs, I & O's  REASON FOR ASSESSMENT:   Consult Enteral/tube feeding initiation and management  ASSESSMENT:   Patient with PMH significant for GERD, HTN, and asthma. Presents this admission with COVID-19 PNA.   12/14- admit 12/23- chest xray revealed pneumomediastinum  12/24- developed bilateral pneumothoraces  12/26- worsening hypoxemia, intubated  12/27- s/p VV fem/fem ECMO cannulation  12/29- s/p trach  1/5- nasal bleeding- packing per CCM 1/7- nasal packing per ENT 1/8- trach exchanged, ECMO circuit changed  1/20- s/p additional RIJ drainage cannula  1/21- Cortrak kinked, replaced by OGT, pt vomited over night 1/22- ECMO circuit exchanged  1/23- nasal endoscopy, control of epistaxis  2/02- TEE, ECMO cannula exchangeoral Cortrak replaced  Pt remains on vent support via trach, remains on VV ECMO  TF on  hold, Cortrak out and to be replaced today. Plan for oral placement of Cortrak  Pt previously tolerating Pivot 1.5 at 75 ml/hr with Pro-Stat 60 mL BID via orally placed Cortrak  Unsure of EDW at this point. Lowest weight since admission 121.0 kg; current wt 150 kg. Net positive 3.5 L  Noted edema present, 2+ edema in UE, 3+ edema in LE; pt with third spacing requiring IV albumin to be administered with lasix for diuresis  Remains hypernatremic (150) indicating a free water deficit. Estimated free water deficit of 5 L based on EDW 121 kg. Noted pt with order for free water flushes of 300 mL q 8 hours. Total of 2268 mL of free water from current TF and free water flushes.  Pt also being diuresed, previously with diarrhea  Pt with rectal tube in place, unsure of output. 0 mL documented thus far today  WOC consulted for newly noted pressure injury to neck related to trach  Labs: sodium 150 (H), BUN 119 (H), Creatinine 1.40  Meds: Vitamin C, zinc sulfate, lasix drip  Diet Order:   Diet Order    None      EDUCATION NEEDS:   Not appropriate for education at this time  Skin:  Skin Assessment: Skin Integrity Issues: Skin Integrity Issues:: Other (Comment), Incisions Stage II: n/a Incisions: L wrist, nose Other: MASD- anus, groin; newly noted pressure injury to neck from trach  Last BM:  2/2 rectal tube  Height:   Ht Readings from Last 1  Encounters:  08/09/19 5' 8"  (1.727 m)    Weight:   Wt Readings from Last 1 Encounters:  09/10/19 (!) 150.8 kg    Ideal Body Weight:  70 kg  BMI:  Body mass index is 50.55 kg/m.  Estimated Nutritional Needs:   Kcal:  7493-5521 kcal (25-30 kcal/kg)  Protein:  180-215 grams  Fluid:  >/= 2.5 L/day   Kerman Passey MS, RDN, LDN, CNSC [phone] Pager  [phone] Weekend/On-Call Pager

## 2019-09-10 NOTE — Progress Notes (Signed)
Dr. Orvan Seen removed trach ties in the OR and sutured trach in place. This RT spoke with Dr. Orvan Seen and he stated that it was okay to leave trach ties off at this time due to the new ECMO cannula being in place.

## 2019-09-10 NOTE — Op Note (Signed)
Procedure(s): TRANSESOPHAGEAL ECHOCARDIOGRAM (TEE) CANNULATION FOR ECMO (EXTRACORPOREAL MEMBRANE OXYGENATION) PUTTING IN CRESCENT 32FR CANNULA  AND REMOVING GROING CANNULATION Procedure Note  [name] male 44 y.o. 09/10/2019  Procedure(s) and Anesthesia Type:    * TRANSESOPHAGEAL ECHOCARDIOGRAM (TEE) - General    * CANNULATION FOR ECMO (EXTRACORPOREAL MEMBRANE OXYGENATION) PUTTING IN CRESCENT 32FR CANNULA  AND REMOVING GROING CANNULATION - General  Surgeon(s) and Role:    * Wonda Olds, MD - Primary    * Bensimhon, Shaune Pascal, MD - Assisting   Indications: The patient has been supported by v-v ecmo for over a month. He has periodic recirculation. The decision is made to change his cannulation configuration to the neck using a Crescent cannula.        Surgeon: Wonda Olds   Assistants:  Haroldine Laws, D MD  Anesthesia: General endotracheal anesthesia  ASA Class: 5    Procedure Detail  TRANSESOPHAGEAL ECHOCARDIOGRAM (TEE), CANNULATION FOR ECMO (EXTRACORPOREAL MEMBRANE OXYGENATION) PUTTING IN CRESCENT 32FR CANNULA  AND REMOVING GROING CANNULATION  The patient was taken to the OR after obtaining informed consent from his wife by telephone. He was placed in the supine position, and anesthesia was confirmed by GET. The anterior neck, chest, abdomen, and groins were cleansed and draped sterilely. A preoperative pause was performed. The right IJ vein was accessed using ultrasonography guidance. A wire was inserted into the IVC through the RA as demonstrated by fluoroscopy. The tract was serially dilated. 10,000 units unfractionated heparin was delivered intravenously. A 32 Fr. Crescent catheter was inserted and positioned by TEE/fluoro. The new ECMO circuit lines were connected to the Crescent cannula. The previous circuit flow was suspended and flow to the new circuit was begun. The groin lines were removed and bleeding was controlled at the skin level manually. The Crescent  cannula was secured to the skin in the appropriate position. The patient tolerated the procedure well.   Estimated Blood Loss:  300 mL         Drains: none          Blood Given: none                 Complications:  * No complications entered in OR log *         Disposition: ICU - intubated and critically ill.         Condition: stable

## 2019-09-10 NOTE — Anesthesia Preprocedure Evaluation (Signed)
Anesthesia Evaluation  Patient identified by MRN, date of birth, ID band Patient unresponsive    Reviewed: Allergy & Precautions, NPO status , Patient's Chart, lab work & pertinent test results  Airway Mallampati: Trach   Neck ROM: Limited    Dental   Pulmonary     + decreased breath sounds  rales    Cardiovascular hypertension,  Rhythm:Regular Rate:Tachycardia     Neuro/Psych    GI/Hepatic   Endo/Other    Renal/GU      Musculoskeletal   Abdominal (+) + obese,   Peds  Hematology   Anesthesia Other Findings   Reproductive/Obstetrics                             Anesthesia Physical Anesthesia Plan  ASA: III  Anesthesia Plan: General   Post-op Pain Management:    Induction: Intravenous  PONV Risk Score and Plan:   Airway Management Planned: Tracheostomy  Additional Equipment: Arterial line, CVP and 3D TEE  Intra-op Plan:   Post-operative Plan: Post-operative intubation/ventilation  Informed Consent: I have reviewed the patients History and Physical, chart, labs and discussed the procedure including the risks, benefits and alternatives for the proposed anesthesia with the patient or authorized representative who has indicated his/her understanding and acceptance.       Plan Discussed with: CRNA and Anesthesiologist  Anesthesia Plan Comments:         Anesthesia Quick Evaluation

## 2019-09-10 NOTE — Consult Note (Signed)
[facility] Nurse Consult Note: Reason for Consult: Attempted to see patient for newly noted pressure injury to neck related to trach ties. Patient is on his way to OR this morning and a visit for assessment is not possible. Wound type: Pressure (Medical Device Related Pressure Injury, MDRPI) Pressure Injury POA: No  WOC Nursing will attempt to see tomorrow and will follow along with you, seeing every 7-10 days. Please re-consult if needed in between visits.  Thanks, Maudie Flakes, MSN, RN, [place], Arther Abbott  Pager# [phone]

## 2019-09-10 NOTE — Progress Notes (Signed)
CT Surgery  L subclavian central catheter [TLC] placed under sterile technique to replace line placed jan 20. Indication - iv access Seldinger technique CXR pending

## 2019-09-11 ENCOUNTER — Encounter (HOSPITAL_COMMUNITY): Payer: Self-pay | Admitting: Certified Registered"

## 2019-09-11 ENCOUNTER — Encounter (HOSPITAL_COMMUNITY)
Admission: EM | Disposition: A | Discharge status: Rehabilitation Facility | Payer: Self-pay | Source: Home / Self Care | Attending: Cardiothoracic Surgery

## 2019-09-11 ENCOUNTER — Inpatient Hospital Stay (HOSPITAL_COMMUNITY): Payer: Managed Care, Other (non HMO)

## 2019-09-11 ENCOUNTER — Encounter: Payer: Self-pay | Admitting: *Deleted

## 2019-09-11 DIAGNOSIS — J80 Acute respiratory distress syndrome: Secondary | ICD-10-CM

## 2019-09-11 DIAGNOSIS — Z8616 Personal history of COVID-19: Secondary | ICD-10-CM

## 2019-09-11 HISTORY — PX: ESOPHAGOGASTRODUODENOSCOPY: SHX5428

## 2019-09-11 HISTORY — PX: PORTACATH PLACEMENT: SHX2246

## 2019-09-11 LAB — POCT I-STAT 7, (LYTES, BLD GAS, ICA,H+H)
Acid-Base Excess: 5 mmol/L — ABNORMAL HIGH (ref 0.0–2.0)
Acid-Base Excess: 7 mmol/L — ABNORMAL HIGH (ref 0.0–2.0)
Acid-Base Excess: 5 mmol/L — ABNORMAL HIGH (ref 0.0–2.0)
Acid-Base Excess: 5 mmol/L — ABNORMAL HIGH (ref 0.0–2.0)
Bicarbonate: 30.3 mmol/L — ABNORMAL HIGH (ref 20.0–28.0)
Bicarbonate: 31.8 mmol/L — ABNORMAL HIGH (ref 20.0–28.0)
Bicarbonate: 30.2 mmol/L — ABNORMAL HIGH (ref 20.0–28.0)
Bicarbonate: 30.4 mmol/L — ABNORMAL HIGH (ref 20.0–28.0)
Calcium, Ion: 1.33 mmol/L (ref 1.15–1.40)
Calcium, Ion: 1.32 mmol/L (ref 1.15–1.40)
Calcium, Ion: 1.31 mmol/L (ref 1.15–1.40)
Calcium, Ion: 1.34 mmol/L (ref 1.15–1.40)
HCT: 28 % — ABNORMAL LOW (ref 39.0–52.0)
HCT: 27 % — ABNORMAL LOW (ref 39.0–52.0)
HCT: 27 % — ABNORMAL LOW (ref 39.0–52.0)
HCT: 25 % — ABNORMAL LOW (ref 39.0–52.0)
Hemoglobin: 9.5 g/dL — ABNORMAL LOW (ref 13.0–17.0)
Hemoglobin: 9.2 g/dL — ABNORMAL LOW (ref 13.0–17.0)
Hemoglobin: 9.2 g/dL — ABNORMAL LOW (ref 13.0–17.0)
Hemoglobin: 8.5 g/dL — ABNORMAL LOW (ref 13.0–17.0)
O2 Saturation: 93 %
O2 Saturation: 91 %
O2 Saturation: 90 %
O2 Saturation: 86 %
Patient temperature: 36.9
Patient temperature: 36.6
Patient temperature: 36.7
Patient temperature: 37
Potassium: 4 mmol/L (ref 3.5–5.1)
Potassium: 3.8 mmol/L (ref 3.5–5.1)
Potassium: 4 mmol/L (ref 3.5–5.1)
Potassium: 4.2 mmol/L (ref 3.5–5.1)
Sodium: 151 mmol/L — ABNORMAL HIGH (ref 135–145)
Sodium: 152 mmol/L — ABNORMAL HIGH (ref 135–145)
Sodium: 153 mmol/L — ABNORMAL HIGH (ref 135–145)
Sodium: 152 mmol/L — ABNORMAL HIGH (ref 135–145)
TCO2: 32 mmol/L (ref 22–32)
TCO2: 33 mmol/L — ABNORMAL HIGH (ref 22–32)
TCO2: 32 mmol/L (ref 22–32)
TCO2: 32 mmol/L (ref 22–32)
pCO2 arterial: 46.7 mmHg (ref 32.0–48.0)
pCO2 arterial: 46.4 mmHg (ref 32.0–48.0)
pCO2 arterial: 47 mmHg (ref 32.0–48.0)
pCO2 arterial: 48.9 mmHg — ABNORMAL HIGH (ref 32.0–48.0)
pH, Arterial: 7.42 (ref 7.350–7.450)
pH, Arterial: 7.443 (ref 7.350–7.450)
pH, Arterial: 7.414 (ref 7.350–7.450)
pH, Arterial: 7.402 (ref 7.350–7.450)
pO2, Arterial: 65 mmHg — ABNORMAL LOW (ref 83.0–108.0)
pO2, Arterial: 58 mmHg — ABNORMAL LOW (ref 83.0–108.0)
pO2, Arterial: 57 mmHg — ABNORMAL LOW (ref 83.0–108.0)
pO2, Arterial: 52 mmHg — ABNORMAL LOW (ref 83.0–108.0)

## 2019-09-11 LAB — CBC
HCT: 30.8 % — ABNORMAL LOW (ref 39.0–52.0)
HCT: 29.6 % — ABNORMAL LOW (ref 39.0–52.0)
Hemoglobin: 9.4 g/dL — ABNORMAL LOW (ref 13.0–17.0)
Hemoglobin: 8.8 g/dL — ABNORMAL LOW (ref 13.0–17.0)
MCH: 28.3 pg (ref 26.0–34.0)
MCH: 28 pg (ref 26.0–34.0)
MCHC: 30.5 g/dL (ref 30.0–36.0)
MCHC: 29.7 g/dL — ABNORMAL LOW (ref 30.0–36.0)
MCV: 92.8 fL (ref 80.0–100.0)
MCV: 94.3 fL (ref 80.0–100.0)
Platelets: 174 10*3/uL (ref 150–400)
Platelets: 153 10*3/uL (ref 150–400)
RBC: 3.32 MIL/uL — ABNORMAL LOW (ref 4.22–5.81)
RBC: 3.14 MIL/uL — ABNORMAL LOW (ref 4.22–5.81)
RDW: 18.6 % — ABNORMAL HIGH (ref 11.5–15.5)
RDW: 18.7 % — ABNORMAL HIGH (ref 11.5–15.5)
WBC: 18.2 10*3/uL — ABNORMAL HIGH (ref 4.0–10.5)
WBC: 17.3 10*3/uL — ABNORMAL HIGH (ref 4.0–10.5)
nRBC: 2.1 % — ABNORMAL HIGH (ref 0.0–0.2)
nRBC: 1.4 % — ABNORMAL HIGH (ref 0.0–0.2)

## 2019-09-11 LAB — GLUCOSE, CAPILLARY
Glucose-Capillary: 136 mg/dL — ABNORMAL HIGH (ref 70–99)
Glucose-Capillary: 150 mg/dL — ABNORMAL HIGH (ref 70–99)
Glucose-Capillary: 104 mg/dL — ABNORMAL HIGH (ref 70–99)
Glucose-Capillary: 121 mg/dL — ABNORMAL HIGH (ref 70–99)
Glucose-Capillary: 115 mg/dL — ABNORMAL HIGH (ref 70–99)

## 2019-09-11 LAB — BASIC METABOLIC PANEL
Anion gap: 14 (ref 5–15)
Anion gap: 17 — ABNORMAL HIGH (ref 5–15)
BUN: 111 mg/dL — ABNORMAL HIGH (ref 6–20)
BUN: 103 mg/dL — ABNORMAL HIGH (ref 6–20)
CO2: 28 mmol/L (ref 22–32)
CO2: 27 mmol/L (ref 22–32)
Calcium: 9.5 mg/dL (ref 8.9–10.3)
Calcium: 9.6 mg/dL (ref 8.9–10.3)
Chloride: 109 mmol/L (ref 98–111)
Chloride: 108 mmol/L (ref 98–111)
Creatinine, Ser: 1.6 mg/dL — ABNORMAL HIGH (ref 0.61–1.24)
Creatinine, Ser: 1.36 mg/dL — ABNORMAL HIGH (ref 0.61–1.24)
GFR calc Af Amer: 59 mL/min — ABNORMAL LOW (ref >60)
GFR calc Af Amer: >60 mL/min (ref >60)
GFR calc non Af Amer: 51 mL/min — ABNORMAL LOW (ref >60)
GFR calc non Af Amer: >60 mL/min (ref >60)
Glucose, Bld: 134 mg/dL — ABNORMAL HIGH (ref 70–99)
Glucose, Bld: 124 mg/dL — ABNORMAL HIGH (ref 70–99)
Potassium: 4.1 mmol/L (ref 3.5–5.1)
Potassium: 3.9 mmol/L (ref 3.5–5.1)
Sodium: 151 mmol/L — ABNORMAL HIGH (ref 135–145)
Sodium: 152 mmol/L — ABNORMAL HIGH (ref 135–145)

## 2019-09-11 LAB — COOXEMETRY PANEL
Carboxyhemoglobin: 3.7 % — ABNORMAL HIGH (ref 0.5–1.5)
Methemoglobin: 1.2 % (ref 0.0–1.5)
O2 Saturation: 82.7 %
Total hemoglobin: 9.5 g/dL — ABNORMAL LOW (ref 12.0–16.0)

## 2019-09-11 LAB — URINE CULTURE: Culture: NO GROWTH

## 2019-09-11 LAB — CMV DNA, QUANTITATIVE, PCR
CMV DNA Quant: POSITIVE IU/mL
Log10 CMV Qn DNA Pl: UNDETERMINED log10 IU/mL

## 2019-09-11 LAB — HEPARIN LEVEL (UNFRACTIONATED)
Heparin Unfractionated: 0.53 IU/mL (ref 0.30–0.70)
Heparin Unfractionated: 0.34 IU/mL (ref 0.30–0.70)

## 2019-09-11 LAB — LACTATE DEHYDROGENASE: LDH: 469 U/L — ABNORMAL HIGH (ref 98–192)

## 2019-09-11 LAB — APTT: aPTT: 130 seconds — ABNORMAL HIGH (ref 24–36)

## 2019-09-11 LAB — MAGNESIUM: Magnesium: 2.5 mg/dL — ABNORMAL HIGH (ref 1.7–2.4)

## 2019-09-11 LAB — FIBRINOGEN: Fibrinogen: 744 mg/dL — ABNORMAL HIGH (ref 210–475)

## 2019-09-11 LAB — LACTIC ACID, PLASMA: Lactic Acid, Venous: 1.1 mmol/L (ref 0.5–1.9)

## 2019-09-11 LAB — GLOMERULAR BASEMENT MEMBRANE ANTIBODIES: GBM Ab: 5 units (ref 0–20)

## 2019-09-11 SURGERY — INSERTION, TUNNELED CENTRAL VENOUS DEVICE, WITH PORT
Anesthesia: Choice

## 2019-09-11 MED ORDER — AMIODARONE HCL IN DEXTROSE 360-4.14 MG/200ML-% IV SOLN
30.0000 mg/h | INTRAVENOUS | Status: DC
  Administered 2019-09-11 – 2019-09-14 (×7): 30 mg/h via INTRAVENOUS
  Filled 2019-09-11 (×6): qty 200

## 2019-09-11 MED ORDER — AMIODARONE HCL IN DEXTROSE 360-4.14 MG/200ML-% IV SOLN
INTRAVENOUS | Status: AC
  Filled 2019-09-11: qty 200

## 2019-09-11 MED ORDER — LIDOCAINE HCL (PF) 1 % IJ SOLN
INTRAMUSCULAR | Status: DC | PRN
  Administered 2019-09-11: 5 mL

## 2019-09-11 MED ORDER — AMIODARONE LOAD VIA INFUSION
150.0000 mg | Freq: Once | INTRAVENOUS | Status: AC
  Administered 2019-09-11: 02:00:00 150 mg via INTRAVENOUS
  Filled 2019-09-11: qty 83.34

## 2019-09-11 MED ORDER — POTASSIUM CHLORIDE 10 MEQ/50ML IV SOLN
10.0000 meq | INTRAVENOUS | Status: AC
  Administered 2019-09-11 (×4): 10 meq via INTRAVENOUS
  Filled 2019-09-11 (×4): qty 50

## 2019-09-11 MED ORDER — AMIODARONE HCL IN DEXTROSE 360-4.14 MG/200ML-% IV SOLN
60.0000 mg/h | INTRAVENOUS | Status: AC
  Administered 2019-09-11 (×2): 60 mg/h via INTRAVENOUS
  Filled 2019-09-11: qty 200

## 2019-09-11 SURGICAL SUPPLY — 18 items
BUTTON OLYMPUS DEFENDO 5 PIECE (MISCELLANEOUS) ×1 IMPLANT
CNTNR URN SCR LID CUP LEK RST (MISCELLANEOUS) IMPLANT
CONT SPEC 4OZ STRL OR WHT (MISCELLANEOUS) ×2
COVER WAND RF STERILE (DRAPES) ×2 IMPLANT
DRAPE TABLE BACK 80X90 (DRAPES) ×1 IMPLANT
GAUZE SPONGE 4X4 12PLY STRL (GAUZE/BANDAGES/DRESSINGS) ×1 IMPLANT
GAUZE SPONGE 4X4 12PLY STRL LF (GAUZE/BANDAGES/DRESSINGS) ×1 IMPLANT
GLOVE ECLIPSE 6.5 STRL STRAW (GLOVE) ×2 IMPLANT
GLOVE NEODERM STRL 7.5 LF PF (GLOVE) IMPLANT
GLOVE SURG NEODERM 7.5  LF PF (GLOVE) ×1
IRRIGATOR SUCT 8 DISP DVNC XI (IRRIGATION / IRRIGATOR) IMPLANT
IRRIGATOR SUCTION 8MM XI DISP (IRRIGATION / IRRIGATOR) ×1
SYR 30ML SLIP (SYRINGE) ×1 IMPLANT
SYR 50ML LL SCALE MARK (SYRINGE) ×1 IMPLANT
TOWEL GREEN STERILE FF (TOWEL DISPOSABLE) ×1 IMPLANT
TUBE ENDOVIVE SAFETY PEG KIT (TUBING) ×1 IMPLANT
TUBING ENDO SMARTCAP (MISCELLANEOUS) ×1 IMPLANT
WATER STERILE IRR 1000ML POUR (IV SOLUTION) ×2 IMPLANT

## 2019-09-11 NOTE — Progress Notes (Signed)
ANTICOAGULATION CONSULT NOTE - Follow Up Consult  Pharmacy Consult for Heparin Indication: ECMO  Patient Measurements: Height: 5' 8"  (172.7 cm) Weight: (!) 328 lb 7.8 oz (149 kg) IBW/kg (Calculated) : 68.4 Heparin Dosing Weight: 104.5kg  Vital Signs: Temp: 97.5 F (36.4 C) (01/21 1600) Temp Source: Axillary (01/21 1600) Pulse Rate: 87 (01/21 1700)  Labs: Recent Labs    09/10/19 0407 09/10/19 0419 09/10/19 0928 09/10/19 0955 09/10/19 1600 09/10/19 1607 09/10/19 1608 09/10/19 1835 09/10/19 1838 09/10/19 2343 09/10/19 2343 09/11/19 0458 09/11/19 0512  HGB 9.0*   < > 8.8*   < >  --   --    < > 9.7*   < > 9.2*   < > 9.4* 9.5*  HCT 29.9*   < > 26.0*   < >  --   --    < > 31.4*   < > 27.0*  --  30.8* 28.0*  PLT 136*  --   --   --   --   --   --  161  --   --   --  174  --   APTT 121*  --   --   --   --  106*  --   --   --   --   --  130*  --   HEPARINUNFRC 0.45  --   --   --   --  0.34  --   --   --   --   --  0.53  --   CREATININE 1.44*   < > 1.40*  --  1.49*  --   --   --   --   --   --  1.60*  --    < > = values in this interval not displayed.    Assessment: 44yo male on VV ECMO on heparin infusion.   Heparin level this morning came back supratherapeutic at 0.53, on 2500 units/hr. Hgb 9.5, plt 176. Fibrinogen down slightly to 744, LDH 469. No s/sx of bleeding or infusion issues. Has continued to have nosebleeds and some trach bleeding starting yesterday- packing removed 1/29 s/p TXA spray applied.    Goal of Therapy:  Heparin level 0.3-0.5 units/ml   Plan:  -Reduce heparin infusion to 2400 units/h -Recheck heparin level at 1300 -Monitor CBC, HL, and for s/sx of bleeding  Antonietta Jewel, PharmD, BCCCP Clinical Pharmacist  Phone: [phone]  Please check AMION for all [place] phone numbers After 10:00 PM, call [place] [phone] 09/11/2019

## 2019-09-11 NOTE — Progress Notes (Signed)
1 Day Post-Op Procedure(s) (LRB): TRANSESOPHAGEAL ECHOCARDIOGRAM (TEE) (N/A) CANNULATION FOR ECMO (EXTRACORPOREAL MEMBRANE OXYGENATION) PUTTING IN CRESCENT 32FR CANNULA  AND REMOVING GROING CANNULATION (N/A) Subjective: Intubated/sedated  Objective: Vital signs in last 24 hours: Temp:  [97.4 F (36.3 C)-98.4 F (36.9 C)] 97.9 F (36.6 C) (02/03 0800) Pulse Rate:  [71-162] 84 (02/03 0800) Cardiac Rhythm: Normal sinus rhythm (02/03 0800) Resp:  [0-32] 20 (02/03 0800) BP: (105-128)/(48-52) 105/51 (02/03 0714) SpO2:  [82 %-98 %] 89 % (02/03 0800) Arterial Line BP: (89-192)/(38-80) 107/52 (02/03 0800) FiO2 (%):  [60 %-100 %] 60 % (02/03 0800) Weight:  [148.5 kg] 148.5 kg (02/03 0500)  Hemodynamic parameters for last 24 hours: CVP:  [9 mmHg-17 mmHg] 15 mmHg  Intake/Output from previous day: 02/02 0701 - 02/03 0700 In: 3946.5 [I.V.:2582.1; Blood:950; IV Piggyback:414.4] Out: 6670 [Urine:6500; Stool:50; Chest Tube:120] Intake/Output this shift: Total I/O In: 295 [I.V.:272.1; IV Piggyback:22.9] Out: 700 [Urine:700]  General appearance: sedated Neurologic: unable to assess Heart: regular rate and rhythm, S1, S2 normal, no murmur, click, rub or gallop Lungs: diminished breath sounds bilaterally Extremities: edema 2+  Lab Results: Recent Labs    09/10/19 1835 09/10/19 1838 09/11/19 0458 09/11/19 0512  WBC 18.4*  --  18.2*  --   HGB 9.7*   < > 9.4* 9.5*  HCT 31.4*   < > 30.8* 28.0*  PLT 161  --  174  --    < > = values in this interval not displayed.   BMET:  Recent Labs    09/10/19 1600 09/10/19 1608 09/11/19 0458 09/11/19 0512  NA 152*   < > 151* 151*  K 3.7   < > 4.1 4.0  CL 108  --  109  --   CO2 28  --  28  --   GLUCOSE 152*  --  134*  --   BUN 116*  --  111*  --   CREATININE 1.49*  --  1.60*  --   CALCIUM 9.6  --  9.5  --    < > = values in this interval not displayed.    PT/INR: No results for input(s): LABPROT, INR in the last 72 hours. ABG     Component Value Date/Time   PHART 7.420 09/11/2019 0512   HCO3 30.3 (H) 09/11/2019 0512   TCO2 32 09/11/2019 0512   ACIDBASEDEF 2.0 08/28/2019 1519   O2SAT 93.0 09/11/2019 0512   CBG (last 3)  Recent Labs    09/10/19 2339 09/11/19 0512 09/11/19 0753  GLUCAP 120* 136* 150*    Assessment/Plan: S/P Procedure(s) (LRB): TRANSESOPHAGEAL ECHOCARDIOGRAM (TEE) (N/A) CANNULATION FOR ECMO (EXTRACORPOREAL MEMBRANE OXYGENATION) PUTTING IN CRESCENT 32FR CANNULA  AND REMOVING GROING CANNULATION (N/A) PEG placement at bedside today  Have discussed risks and benefits as a team and with the family who agree to proceed.    LOS: 51 days    Wonda Olds 09/11/2019

## 2019-09-11 NOTE — Op Note (Signed)
Procedure(s): PEG TUBE INSERTION - BEDSIDE ESOPHAGOGASTRODUODENOSCOPY (EGD) Procedure Note  [name] male 44 y.o. 09/11/2019  Procedure(s) and Anesthesia Type:    * PEG TUBE INSERTION - BEDSIDE - Choice    * ESOPHAGOGASTRODUODENOSCOPY (EGD) - Choice  Surgeon(s) and Role:    * Wonda Olds, MD - Primary   Indications: The patient has been treated for severe lung disease after COVID infection. Remains on ECMO. Will need enteral feeding access. Consented for PEG at bedside.     Surgeon: Wonda Olds   Assistants: staff  Anesthesia: General endotracheal anesthesia  ASA Class: 5    Procedure Detail  PEG TUBE INSERTION - BEDSIDE, ESOPHAGOGASTRODUODENOSCOPY (EGD)  After informed consent, the mobile endoscopy unit was brought to the bedside. The anterior left abdomen was cleansed and draped with betadine solution. A preoperative pause was performed. An endoscope was inserted into the oropharynx and then the proximal esophagus. The scope was advanced easily into the stomach which was insufflated to distention. A hollow-bore needle was inserted into the stomach through the abdominal wall under endoscopic vision. A wire was grasped with endoscopic loop, and the wire was withdrawn into the mouth. The PEG tube was attached to the exteriorized wire and then pulled back into the stomach. The tube was exteriorized onto the abdominal wall and secured. The endoscope was reinserted to examine the stomach for bleeding and proper PEG positioning, which was confirmed. Stomach air was aspirated; the esophagus was thoroughly examined from the GE junction to the oropharynx and there was no mucosal abnormalities identified. This concluded the procedure; the patient tolerated it well.   Estimated Blood Loss:  Minimal         Drains: none          Blood Given: none          Specimens: none         Implants: PEG tube         Complications:  * No complications entered in OR log *   Disposition: ICU - intubated and critically ill.         Condition: stable

## 2019-09-11 NOTE — Progress Notes (Addendum)
Patient ID: [name], male   DOB: 05-25-1976, 44 y.o.   MRN: [id number]    Advanced Heart Failure Rounding Note   Subjective:    Circuit changed and trach exchanged on 1/8.   Underwent addition of a RIJ drainage cannula on 1/20 Circuit changed on 1/22 for rising LDH Had evidence of recirculation and RIJ cannula pulled back on 1/26  Underwent change to [facility] catheter with removal of femoral lines on 2/2  Remains on VV ecmo with good flows.   Sats 89-95%  ABG 7.42/47/65/93%  Co-ox 83%   On vent now at 60% PEEP 15. PlatP 30, Bi-vent setting.TVs ~300cc Personally reviewed  CXR improving. Crescent catheter well positioned   Had bleeding from the mouth after TEE yesterday.  Back on NE at 11.    hgb 9.5 wbc 18.0 -> 18.5K -> 18.2. recultured last night   ECMO parameters - see separate ecmo note   Objective:   Weight Range:  Vital Signs:   Temp:  [97.4 F (36.3 C)-98.4 F (36.9 C)] 97.9 F (36.6 C) (02/03 0800) Pulse Rate:  [71-162] 84 (02/03 0800) Resp:  [0-32] 20 (02/03 0800) BP: (105-128)/(48-52) 105/51 (02/03 0714) SpO2:  [82 %-98 %] 89 % (02/03 0800) Arterial Line BP: (89-192)/(38-80) 107/52 (02/03 0800) FiO2 (%):  [60 %-100 %] 60 % (02/03 0800) Weight:  [148.5 kg] 148.5 kg (02/03 0500) Last BM Date: 09/10/19  Weight change: Filed Weights   09/09/19 0453 09/10/19 0348 09/11/19 0500  Weight: (!) 148 kg (!) 150.8 kg (!) 148.5 kg    Intake/Output:   Intake/Output Summary (Last 24 hours) at 09/11/2019 0826 Last data filed at 09/11/2019 0800 Gross per 24 hour  Intake 4118.37 ml  Output 7020 ml  Net -2901.63 ml     Physical Exam: General: Sedated on vent. Bed inclined HEENT: normal x for oral bleeding  Neck: supple. RIJ Crescent cannula site ok  Carotids 2+ bilat; no bruits. No lymphadenopathy or thryomegaly appreciated. Cor: PMI nondisplaced. Regular rate & rhythm. No rubs, gallops or murmurs. Lungs: decreased throughout. coarse Abdomen: obese soft,  nontender, nondistended. No hepatosplenomegaly. No bruits or masses. Good bowel sounds. Extremities: no cyanosis, clubbing, rash, 2-3+ edema  Groin sites ok  Neuro: sedated on vent    Telemetry: Sinus 80s  Personally reviewed  Labs: Basic Metabolic Panel: Recent Labs  Lab 09/07/19 0433 09/07/19 0443 09/09/19 0304 09/09/19 0320 09/09/19 1600 09/09/19 1601 09/10/19 0407 09/10/19 0419 09/10/19 0928 09/10/19 0955 09/10/19 1600 09/10/19 1608 09/10/19 1712 09/10/19 1838 09/10/19 2343 09/11/19 0228 09/11/19 0458 09/11/19 0512  NA 151*   < > 152*   < > 151*   < > 149*   < > 151*   < > 152*   < > 151* 152* 151*  --  151* 151*  K 4.2   < > 3.9   < > 4.2   < > 3.9   < > 3.6   < > 3.7   < > 3.7 3.7 3.5  --  4.1 4.0  CL 107   < > 110   < > 111  --  107  --  107  --  108  --   --   --   --   --  109  --   CO2 30   < > 28  --  27  --  27  --   --   --  28  --   --   --   --   --  28  --   GLUCOSE 163*   < > 221*   < > 187*  --  124*  --  139*  --  152*  --   --   --   --   --  134*  --   BUN 81*   < > 110*   < > 120*  --  123*  --  119*  --  116*  --   --   --   --   --  111*  --   CREATININE 0.99   < > 1.28*   < > 1.28*  --  1.44*  --  1.40*  --  1.49*  --   --   --   --   --  1.60*  --   CALCIUM 9.4   < > 9.5   < > 9.5   < > 9.9  --   --   --  9.6  --   --   --   --   --  9.5  --   MG 2.8*  --   --   --   --   --   --   --   --   --   --   --   --   --   --  2.5*  --   --    < > = values in this interval not displayed.    Liver Function Tests: No results for input(s): AST, ALT, ALKPHOS, BILITOT, PROT, ALBUMIN in the last 168 hours. No results for input(s): LIPASE, AMYLASE in the last 168 hours. No results for input(s): AMMONIA in the last 168 hours.  CBC: Recent Labs  Lab 09/09/19 0304 09/09/19 0320 09/09/19 1552 09/09/19 1601 09/10/19 0407 09/10/19 0419 09/10/19 1835 09/10/19 1838 09/10/19 2343 09/11/19 0458 09/11/19 0512  WBC 12.2*  --  11.4*  --  18.0*  --  18.4*  --    --  18.2*  --   HGB 8.0*   < > 8.4*   < > 9.0*   < > 9.7* 10.5* 9.2* 9.4* 9.5*  HCT 27.9*   < > 28.7*   < > 29.9*   < > 31.4* 31.0* 27.0* 30.8* 28.0*  MCV 96.9  --  97.0  --  94.0  --  91.8  --   --  92.8  --   PLT 126*  --  120*  --  136*  --  161  --   --  174  --    < > = values in this interval not displayed.    Cardiac Enzymes: No results for input(s): CKTOTAL, CKMB, CKMBINDEX, TROPONINI in the last 168 hours.  BNP: BNP (last 3 results) Recent Labs    07/22/19 1039  BNP 15.3    ProBNP (last 3 results) No results for input(s): PROBNP in the last 8760 hours.    Other results:  Imaging: DG Chest 1 View  Result Date: 09/10/2019 CLINICAL DATA:  History of heart surgery.  ECMO. EXAM: CHEST  1 VIEW COMPARISON:  Prior study same day.  Prior study 09/09/2019. FINDINGS: Endotracheal tube, left subclavian left, right PICC line, bilateral chest tubes in stable position. ECMO device noted projected over the right chest. Cardiomegaly. Diffuse severe bilateral pulmonary infiltrates are again noted. No prominent pleural effusion noted. No pneumothorax. IMPRESSION: 1. Lines and tubes as above. Bilateral chest tubes in stable position. No pneumothorax. 2.  Stable cardiomegaly. 3.  Diffuse unchanged dense  bilateral pulmonary infiltrates/edema. Electronically Signed   By: Marcello Moores  Register   On: 09/10/2019 11:02   DG CHEST PORT 1 VIEW  Result Date: 09/10/2019 CLINICAL DATA:  Central line placement. EXAM: PORTABLE CHEST 1 VIEW COMPARISON:  Same day. FINDINGS: Stable cardiomediastinal silhouette. Tracheostomy tube is unchanged in position. Left subclavian catheter is unchanged. Bilateral chest tubes are noted without pneumothorax. Stable bilateral diffuse lung opacities are noted most consistent with multifocal pneumonia or edema. Bony thorax is unremarkable. IMPRESSION: 1. Stable support apparatus. Stable bilateral lung opacities as described above. 2. No evidence of pneumothorax. Electronically  Signed   By: Marijo Conception M.D.   On: 09/10/2019 15:15   DG CHEST PORT 1 VIEW  Result Date: 09/10/2019 CLINICAL DATA:  Acute respiratory failure with hypoxia. EXAM: PORTABLE CHEST 1 VIEW COMPARISON:  Chest x-ray 09/09/2019 FINDINGS: The tracheostomy tube is in good position, unchanged. The feeding tube is coursing down the esophagus and into the stomach. Right PICC line is stable. Right-sided IVC catheter is stable. Bilateral chest tubes are stable. Persistent significant diffuse airspace process. No definite pleural effusions. IMPRESSION: 1. Stable support apparatus. 2. Persistent significant diffuse airspace process. Electronically Signed   By: Marijo Sanes M.D.   On: 09/10/2019 06:40   DG CHEST PORT 1 VIEW  Result Date: 09/09/2019 CLINICAL DATA:  Acute respiratory failure, ECMO EXAM: PORTABLE CHEST 1 VIEW COMPARISON:  09/09/2019 at 5:04 a.m. FINDINGS: Two frontal views of the chest are obtained. Tracheostomy tube, enteric catheter, left subclavian catheter, bilateral chest tubes, and IVC catheter unchanged. Persistent widespread airspace disease throughout the lungs in this patient on ECMO. Interval development of trace left apical pneumothorax, volume estimate of far less than 5%. No evidence for effusion. IMPRESSION: 1. Interval development of trace left apical pneumothorax volume estimated far less than 5%. 2. Otherwise stable exam. These results will be called to the ordering clinician or representative by the Radiologist Assistant, and communication documented in the PACS or zVision Dashboard. Electronically Signed   By: Randa Ngo M.D.   On: 09/09/2019 14:17   ECHO INTRAOPERATIVE TEE  Result Date: 09/10/2019  *INTRAOPERATIVE TRANSESOPHAGEAL REPORT *  Patient Name:   JAXN CHIQUITO Date of Exam: 09/10/2019 Medical Rec #:  [id number]      Height:       68.0 in Accession #:    [id number]     Weight:       332.5 lb Date of Birth:  1975-09-20      BSA:          2.54 m Patient Age:    45 years        BP:           128/48 mmHg Patient Gender: M              HR:           81 bpm. Exam Location:  Anesthesiology Transesophogeal exam was perform intraoperatively during surgical procedure. Patient was closely monitored under general anesthesia during the entirety of examination. Indications:     CANNULATION FOR ECMO Performing Phys: [id number] VHQIONG Z ATKINS Diagnosing Phys: Roberts Gaudy MD Complications: No known complications during this procedure. PRE-OP FINDINGS  Left Ventricle: The LV cavity was normal in size with vigorus LV contractility. The ejection fraction was estimated at 65-70% by visual inspection.here was normal LV wall thickness. Right Ventricle: The right ventricle has normal systolic function. The cavity was mildly enlarged. There is no increase in right ventricular wall thickness. The  RV cavity was moderately enlarged. There was normal RV systolic function. There was interventricular septal flattening throughout systole consistent with elevated RV pressures. Left Atrium: Left atrial size was not assessed. The left atrial appendage is well visualized and there is no evidence of thrombus present. Right Atrium: Upon initial examination, the ECMO outfow cannula was abutting the interatrial septum with tubulent flow contacting the interatrial septum.. There was no evidence of patent foramen ovale. Following removal of the femoral VV ECMO cannulae, and insertion of the internal jugular ECMO cannula, the cannula could be visualized entering the RA from the superior vena cava and extending into the inferior vena cava. The inflow ports of the cannula could not be visualized with color flow Doppler. The ECMO outflow could be visualized in the RA with flow was directed towrds the interatrial septum and tracting inferiorly towrds the tricuspid valve. The internal jugular cannula was not abutting the interatrial septum as was noted with the femoral outflow cannula. Interatrial Septum: No atrial level shunt  detected by color flow Doppler. Pericardium: There is no evidence of pericardial effusion. Mitral Valve: The mitral valve is normal in structure. Mild thickening of the mitral valve leaflet. Mitral valve regurgitation is mild by color flow Doppler. The MR jet is centrally-directed. Tricuspid Valve: The tricuspid valve was normal in structure. Tricuspid valve regurgitation is mild-moderate by color flow Doppler. The jet is directed centrally. Aortic Valve: The aortic valve is tricuspid There is mild thickening of the aortic valve. Aortic valve regurgitation was not visualized by color flow Doppler. There is no evidence of aortic valve stenosis. There is no evidence of a vegetation on the aortic valve. Pulmonic Valve: The pulmonic valve was not assessed. Pulmonic valve regurgitation is not visualized by color flow Doppler.  Roberts Gaudy MD Electronically signed by Roberts Gaudy MD Signature Date/Time: 09/10/2019/6:02:26 PM    Final    HYBRID OR IMAGING ([place])  Result Date: 09/10/2019 There is no interpretation for this exam.  This order is for images obtained during a surgical procedure.  Please See "Surgeries" Tab for more information regarding the procedure.     Medications:     Scheduled Medications: . sodium chloride   Intravenous Once  . vitamin C  500 mg Per Tube Daily  . chlorhexidine gluconate (MEDLINE KIT)  15 mL Mouth Rinse BID  . Chlorhexidine Gluconate Cloth  6 each Topical Daily  . diazepam  20 mg Per Tube Q6H  . feeding supplement (PRO-STAT SUGAR FREE 64)  60 mL Per Tube TID  . free water  300 mL Per Tube Q8H  . gabapentin  200 mg Per Tube Q8H  . HYDROmorphone  16 mg Per Tube Q4H  . insulin aspart  0-20 Units Subcutaneous Q4H  . insulin detemir  20 Units Subcutaneous BID  . ipratropium-albuterol  3 mL Nebulization Q4H  . mouth rinse  15 mL Mouth Rinse 10 times per day  . montelukast  10 mg Per Tube QHS  . oxymetazoline  1 spray Each Nare BID  . pantoprazole sodium  40 mg Per  Tube QHS  . propranolol  20 mg Per Tube TID  . QUEtiapine  50 mg Per Tube BID  . sodium chloride flush  10-40 mL Intracatheter Q12H  . Thrombi-Pad  1 each Topical Once  . Thrombi-Pad  1 each Topical Once  . white petrolatum   Topical BID  . zinc sulfate  220 mg Per Tube Daily    Infusions: . sodium chloride    .  sodium chloride Stopped (09/05/19 1900)  . sodium chloride 500 mL (09/10/19 2034)  . albumin human Stopped (09/09/19 0518)  . amiodarone 30 mg/hr (09/11/19 0800)  . dexmedetomidine (PRECEDEX) IV infusion 1.6 mcg/kg/hr (09/11/19 0800)  . feeding supplement (PIVOT 1.5 CAL)    . furosemide (LASIX) infusion 8 mg/hr (09/11/19 0800)  . heparin 2,400 Units/hr (09/11/19 0800)  . HYDROmorphone 6 mg/hr (09/11/19 0600)  . midazolam 14 mg/hr (09/11/19 0745)  . norepinephrine 10 mcg/min (09/10/19 2100)    PRN Medications: Place/Maintain arterial line **AND** sodium chloride, sodium chloride, sodium chloride, acetaminophen (TYLENOL) oral liquid 160 mg/5 mL, acetaminophen, albumin human, hydrALAZINE, HYDROmorphone, magic mouthwash, magnesium hydroxide, midazolam, ondansetron (ZOFRAN) IV, senna-docusate, sodium chloride   Assessment/Plan:    1. Acute hypoxic/hypercarbic respiratory failure due to COVID PNA: Remained markedly hypoxic despite full support. Intubated 12/26. S/p bilateral CTs 12/26 for pneumothoraces.  TEE on 12/26 LVEF 70% RV ok. VV ECMO begun 12/26. Tracheostomy 12/29.  COVID ARDS at this point. ECMO circuit changed 1/8 & 1/23. Crescent cannula placed 2/2 (femoral cannuals removed) - ECMO parameters as per ECMO note - Flows look very good on [name]. Sats and CXR improved - Able to come down on sedation significantly. Will continue wean (GENTLY) as tolerated - LDH 480 -> 412 -> 445 -> 432 -> 450 -> 481 -> 508 -> 557 -> 555 (circuit change) -> 469  Watch closely - Continue heparin. Discussed dosing with PharmD personally. - On lasix gtt but creatinine and BUN rising.  Will hold lasix today despite weight up 30-40 pounds. (Are we overfeeding him?) - Continue chest PT - Sweep down to 7-8 continue to wean.   2. Bilateral PTX: Due to barotrauma.  - Driving pressure minimized at < 15 Stable. No change  3. COVID PNA: management of COVID infection per CCM. CXR with bilateral multifocal PNA progressing to likely ARDS.  - s/p 10 days of remdesivir - 12/16, 12/2 s/p tociluzimab - 12/17 s/p convalescent plasma - Decadron at 38m/daily completed 1/11 - Completed cefepime/vanc - On Ancef for nasal packing/sinusitis  4. ID: Empiric coverage with vancomycin/cefepime initially for possible secondary bacterial PNA, course completed.   - treated with cefepime and vanc for + cx with staph epidimidis, MSSA and serratia (sensitivities ok). - On Ancef for nasal packing - WBC rising. Cx redrawn 2/2 Low threshold to expand abx.   5. Thrombocytopenia: Slow decrease in platelets, likely low level hemolysis + critical illness.  - resolved. Platelets 174k today  6. Anemia: Bleeding at trach site resolved but still with epistaxis - hgb 9.5  today - Transfuse hgb < 8  7. FEN - continue TFs via oral Cor-trak. Are we overfeeding?   8. Hypernatremia - NA 155-> 152 -> 151. Continue FW flushes  9. AKI - likely intravascul volume depletion. Hold lasix.  - Can give some D5 as needed  D/w with ECMO team (ECMO coordinator/specialists, CCM, TCTS and pharmD) at bedside on multidisciplinary rounds. Continue to await ARDS recovery. Details as above.   Length of Stay: 543 CRITICAL CARE Performed by: BGlori Bickers Total critical care time: 45 minutes  Critical care time was exclusive of separately billable procedures and treating other patients.  Critical care was necessary to treat or prevent imminent or life-threatening deterioration.  Critical care was time spent personally by me (independent of midlevel providers or residents) on the following activities: development  of treatment plan with patient and/or surrogate as well as nursing, discussions with consultants, evaluation of patient's  response to treatment, examination of patient, obtaining history from patient or surrogate, ordering and performing treatments and interventions, ordering and review of laboratory studies, ordering and review of radiographic studies, pulse oximetry and re-evaluation of patient's condition.    Glori Bickers MD 09/11/2019, 8:26 AM  Advanced Heart Failure Team Pager [phone] (M-F; 7a - 4p)  Please contact [facility] for night-coverage after hours (4p -7a ) and weekends on [email or web address]

## 2019-09-11 NOTE — Progress Notes (Signed)
Patient becoming increasingly agitated.  Restarted versed, titrating precidex and dilaudid.  Patient's  heart rate in the 160's, AFIB w/RVR.  Notified [place].  Awaiting orders.

## 2019-09-11 NOTE — Plan of Care (Signed)
  Problem: Education: Goal: Knowledge of risk factors and measures for prevention of condition will improve Outcome: Progressing   Problem: Coping: Goal: Psychosocial and spiritual needs will be supported Outcome: Progressing   Problem: Respiratory: Goal: Will maintain a patent airway Outcome: Progressing Goal: Complications related to the disease process, condition or treatment will be avoided or minimized Outcome: Progressing   Problem: Respiratory: Goal: Ability to maintain a clear airway and adequate ventilation will improve Outcome: Progressing   Problem: Role Relationship: Goal: Method of communication will improve Outcome: Progressing   Problem: Education: Goal: Knowledge of General Education information will improve Description: Including pain rating scale, medication(s)/side effects and non-pharmacologic comfort measures Outcome: Progressing   Problem: Health Behavior/Discharge Planning: Goal: Ability to manage health-related needs will improve Outcome: Progressing   Problem: Clinical Measurements: Goal: Ability to maintain clinical measurements within normal limits will improve Outcome: Progressing Goal: Will remain free from infection Outcome: Progressing Goal: Diagnostic test results will improve Outcome: Progressing Goal: Respiratory complications will improve Outcome: Progressing Goal: Cardiovascular complication will be avoided Outcome: Progressing   Problem: Activity: Goal: Risk for activity intolerance will decrease Outcome: Progressing   Problem: Nutrition: Goal: Adequate nutrition will be maintained Outcome: Progressing   Problem: Coping: Goal: Level of anxiety will decrease Outcome: Progressing   Problem: Elimination: Goal: Will not experience complications related to bowel motility Outcome: Progressing Goal: Will not experience complications related to urinary retention Outcome: Progressing   Problem: Pain Managment: Goal: General  experience of comfort will improve Outcome: Progressing   Problem: Safety: Goal: Ability to remain free from injury will improve Outcome: Progressing   Problem: Skin Integrity: Goal: Risk for impaired skin integrity will decrease Outcome: Progressing

## 2019-09-11 NOTE — Consult Note (Signed)
WOC Nurse Consult Note: Reason for Consult: Unstageable full thickness pressure injury to posterior neck skin fold in area of previous trach tie use. Lurline Idol ties are no longer being used. Neck must still be slightly hyperextended due to right sided cannulas. Wound type: Medical Device Related Pressure Injury (MDRPI) in posterior neck skin fold Pressure Injury POA: No Measurement: 0.8cm x 3cm with depth unable to be determined due to the presence of soft, movable yellow slough obscuring wound bed Wound bed: 60% of wound bed is obscured by the presence of soft yellow eschar. 40% of wound bed is red, moist Drainage (amount, consistency, odor) small amount light yellow exudate on pad covering pillow case from wound Periwound: erythematous, moist Dressing procedure/placement/frequency: I will initiate a xeroform wound contact layer to provide a moisture retentive environment in support of autolysis as well as for the dressing's antimicrobial and nonadherent properties. This will be topped with a dry gauze 2x2 and either held in place by the weight of the skin fold or with 2-inch Medipore tape. This dressing is to be changed twice daily and PRN dressing dislodgement.  It is noted that bilateral Unna's boots have been placed by Ortho Tech and that these are being maintained by that department with once weekly (on Friday) changes. Patient has bilateral Prevalon pressure redistribution heel boots and a sacral prophylactic foam dressing. Two bedside RN's indicate no other needs at this time. Their expertise and assistance is appreciated.  [place] nursing team will follow this wound with you, seeing the patient every 7-10 days, and will remain available to this patient, the nursing and medical teams.  Please re-consult if needed in between visits. Thanks, Maudie Flakes, MSN, RN, [place], Arther Abbott  Pager# [phone]

## 2019-09-11 NOTE — Progress Notes (Signed)
eLink Physician-Brief Progress Note Patient Name: [name] DOB: 01/31/1976 MRN: [id number]   Date of Service  09/11/2019  HPI/Events of Note  Afib with RVR - Ventricular rate = 140's - 170's. Amiodarone IV infusion stopped today. K+ = 3.5 and is being replaced.   eICU Interventions  Will order: 1. Mg++ level STAT.  2. Amiodarone IV load and infusion.      Intervention Category Major Interventions: Arrhythmia - evaluation and management  Koleen Celia Eugene 09/11/2019, 2:10 AM

## 2019-09-11 NOTE — Progress Notes (Signed)
ANTICOAGULATION CONSULT NOTE - Follow Up Consult  Pharmacy Consult for Heparin Indication: ECMO  Patient Measurements: Height: 5' 8"  (172.7 cm) Weight: (!) 328 lb 7.8 oz (149 kg) IBW/kg (Calculated) : 68.4 Heparin Dosing Weight: 104.5kg  Vital Signs: Temp: 97.5 F (36.4 C) (01/21 1600) Temp Source: Axillary (01/21 1600) Pulse Rate: 87 (01/21 1700)  Labs: Recent Labs    09/10/19 0407 09/10/19 0407 09/10/19 0419 09/10/19 1600 09/10/19 1607 09/10/19 1608 09/10/19 1835 09/10/19 1838 09/11/19 0458 09/11/19 0512 09/11/19 1220 09/11/19 1220 09/11/19 1739 09/11/19 1741 09/11/19 1954  HGB 9.0*  --    < >  --   --    < > 9.7*   < > 9.4*   < > 9.2*   < > 8.8* 9.2*  --   HCT 29.9*  --    < >  --   --    < > 31.4*   < > 30.8*   < > 27.0*  --  29.6* 27.0*  --   PLT 136*   < >  --   --   --   --  161  --  174  --   --   --  153  --   --   APTT 121*  --   --   --  106*  --   --   --  130*  --   --   --   --   --   --   HEPARINUNFRC 0.45   < >  --   --  0.34  --   --   --  0.53  --   --   --   --   --  0.34  CREATININE 1.44*  --    < > 1.49*  --   --   --   --  1.60*  --   --   --  1.36*  --   --    < > = values in this interval not displayed.    Assessment: 44yo male on VV ECMO on heparin infusion.   Heparin therapeutic at 0.34, repeat CBC stable this evening.  Goal of Therapy:  Heparin level 0.3-0.5 units/ml   Plan:  -Continue heparin infusion 2400 units/h -Heparin levels q12h   Arrie Senate, PharmD, BCPS Clinical Pharmacist [phone] Please check AMION for all [place] numbers 09/11/2019

## 2019-09-11 NOTE — Progress Notes (Signed)
NAME:  [name], MRN:  [id number], DOB:  1976-08-01, LOS: 3 ADMISSION DATE:  07/22/2019, CONSULTATION DATE:  12/24 REFERRING MD:  Sloan Leiter, CHIEF COMPLAINT:  Dyspnea   Brief History   44 y/o M admitted 12/14 with COVID pneumonia causing acute hypoxemic respiratory failure. Developed pneumomediastinum 12/23 with concern for possible bilateral small pneumothoraces on 12/24.  PCCM consulted for evaluation of pneumothoraces  Past Medical History  GERD HTN Asthma  Significant Hospital Events   12/14 admit with hypoxemic respiratory failure in setting of COVID-19 pneumonia 12/23 noted to have pneumomediastinum on chest x-ray 12/24 PCCM consulted for evaluation 12/26 worsening hypoxemia, intubated  12/27 VV fem/fem ECMO cannulation See bedside nursing event notes for full rundown of procedures after 12/27 1/30 improved cxr, improved lung compliance, TV 350s on 15PC  1/31 CXR increased infiltrates, +CFB     2/2 Converted to right IJ Crescent cannula.  Consults:  PCCM, CTS, CHF, Palliative  Procedures:  See bedside nursing event notes for full rundown of procedures.  Significant Diagnostic Tests:   CT chest 12/22 >> extensive pneumomediastinum, multifocal patchy bilateral groundglass opacities CXR 1/25 >> improving bilateral infiltrates. ECMO cannulae in place. CXR 2/2  Micro Data:  BCx2 12/14 >> negative  Sputum 12/15 >> negative  1/9 bronch: serratia and MSSA 1/2 acid fast smear bronch: negative 1/2 fungal cx bronch: pending 1/12 BAL: serratia 1/18 BAL >> rare Serratia. 2/1 BAL >> rare Serratia - few WBC  Antimicrobials:  Received remdesivir, steroids, actemra and convalescent plasma  5 days cefepime 12/24-12/29 Cefepime 1/4->1/14 vanc 1/4->1/14 Ceftriaxone 1/14 >>1/27 Cefazolin 1/28>> 02/2   Interim history/subjective:   Improved flow with decreased sweep following circuit change. Oral and nasal bleeding have resumed following heparin bolus.   Awakened last  night, but required re-initiation of IV sedation given lack of enteral access.  Objective   Blood pressure (!) 105/51, pulse 85, temperature 98.4 F (36.9 C), resp. rate 19, height _0  (1.727 m), weight (!) 148.5 kg, SpO2 (!) 89 %. CVP:  [9 mmHg-17 mmHg] 17 mmHg  Vent Mode: PCV FiO2 (%):  [60 %-100 %] 60 % Set Rate:  [20 bmp] 20 bmp PEEP:  [20 cmH20] 20 cmH20 Plateau Pressure:  [33 cmH20-35 cmH20] 34 cmH20   Intake/Output Summary (Last 24 hours) at 09/11/2019 0748 Last data filed at 09/11/2019 0700 Gross per 24 hour  Intake 3946.49 ml  Output 6670 ml  Net -2723.51 ml   Filed Weights   09/09/19 0453 09/10/19 0348 09/11/19 0500  Weight: (!) 148 kg (!) 150.8 kg (!) 148.5 kg   Examination General appearance: 44 y.o., male, trach in place, reverse Trendelenburg, Eyes: Opens eyes periodically. HENT: pupils equal and reactive. No further epistaxis. Minimal bleeding from tracheostomy Lungs: abdominal breathing. Bronchial breath sounds left lung and vesicular on right anteriorly. Bronchial from anterior axillary line back bilaterally.  Chest: BL chest tubes, tidaling with air leak.  CV: RRR, s1 s2, no MRG  Abdomen: soft, nt, nd Extremities: diffuse anasarca. Now largely in dependent areas. Unna boots to both legs. Skin: anterior chest folliculitis Neuro: no response to painful stimuli   Performed bedside echo which showed normal LV function. RV still dilated but contractility has improved and septal dyskinesis is no longer evident. More obvious TR with RVSP of at least 18mHg. Cannula tip not seen in RA and return jet not as noticeable.  CXR - BL infiltrates unchanged.. The patient's images have been independently reviewed by me.    CT head 1/28:  pansinusitis. CT chest 1/28: Dense bilateral consolidation in dependent lungs both sides CT abdomen 1/28: Cannulae in position in IVC.  Flow (LPM): 8, Flow (LPM): 5.17 ECMO Mode: VV ECMO Device: Cardiohelp  $ Ventilator Initial/Subsequent  : Subsequent, Vent Mode: PCV, Set Rate: 20 bmp, FiO2 (%): 60 %, I Time: 0.8 Sec(s), PEEP: 20 cmH20 Vt today 147m  Net IO Since Admission: 42,249.65 mL [09/11/19 0748]  Resolved Hospital Problem list   MSSA and serratia marcescens pneumonia   Assessment & Plan:   Acute hypoxemic respiratory failure with ARDS secondary to COVID-19, status post tracheostomy on mechanical ventilation and VV ECMO support.  Slowly resolving ARDS. Limited respiratory reserve with persistently high burden of disease, in keeping with natural history of COVID related ARDS.  Generally easier flow following cannula change. - Hold on bronchoscopy today. - Continue lung protective ventilation.  - Continue to diurese aggressively for now. Decreasing edema, now largely dependent. Slow diuresis rate as creatinine is rising.  - PEG to re-establish enteral access.  Posterior epistaxis appears to have resolved following a period of anticoagulation interruption. Current bleeding is oral and possibly subsequent to traumatic TEE insertion.  Continue nose/nare care TXA instilled in oropharynx. - Heparin interruption will help hemostasis.  Episode of AF. Currently on amiodarone. - transition to oral amiodarone once enteral access established.  Hypernatremia - FW 2028mvia tube.  Daily Goals Checklist   Pain/Anxiety/Delirium protocol (if indicated): bolus IV sedation only. Resume oral post PEG VAP protocol (if indicated): Bundle in place. Respiratory support goals: Pressure control ventilation with ultra protective lung ventilation. Titrate FiO2 down again. Continue PEEP 20  Blood pressure target:  MAP>65, Continue NE. Hold amiodarone and milrinone for now. DVT prophylaxis: Remains on systemic heparin - target Heparin level 0.3-0.7 Nutrition Status: Nutrition Problem: Increased nutrient needs Etiology: acute illness(COVID PNA) Signs/Symptoms: estimated needs Interventions: Tube feeding, Prostat Aim for lower caloric  goal. GI prophylaxis: Protonix per tube. Fluid status goals: hold furosemide today. Urinary catheter: Guide hemodynamic management Glucose control: Euglycemic control with SSI Mobility/therapy needs: Bedrest.  Daily labs: as per ECMO protocol. Code Status: Full code  Family Communication:family updated by RN. Disposition: ICU  Labs   CBC: CBC Latest Ref Rng & Units 09/11/2019 09/11/2019 09/10/2019  WBC 4.0 - 10.5 K/uL - 18.2(H) -  Hemoglobin 13.0 - 17.0 g/dL 9.5(L) 9.4(L) 9.2(L)  Hematocrit 39.0 - 52.0 % 28.0(L) 30.8(L) 27.0(L)  Platelets 150 - 400 K/uL - 174 -    Basic Metabolic Panel: BMP Latest Ref Rng & Units 09/11/2019 09/11/2019 09/10/2019  Glucose 70 - 99 mg/dL - 134(H) -  BUN 6 - 20 mg/dL - 111(H) -  Creatinine 0.61 - 1.24 mg/dL - 1.60(H) -  Sodium 135 - 145 mmol/L 151(H) 151(H) 151(H)  Potassium 3.5 - 5.1 mmol/L 4.0 4.1 3.5  Chloride 98 - 111 mmol/L - 109 -  CO2 22 - 32 mmol/L - 28 -  Calcium 8.9 - 10.3 mg/dL - 9.5 -    GFR: Estimated Creatinine Clearance: 82.8 mL/min (A) (by C-G formula based on SCr of 1.6 mg/dL (H)). Recent Labs  Lab 09/08/19 0245 09/08/19 1631 09/09/19 0304 09/09/19 0304 09/09/19 1552 09/10/19 0407 09/10/19 1835 09/11/19 0458  WBC 10.8*   < > 12.2*   < > 11.4* 18.0* 18.4* 18.2*  LATICACIDVEN 1.3  --  1.5  --   --  0.8  --  1.1   < > = values in this interval not displayed.    Liver Function Tests: No  results for input(s): AST, ALT, ALKPHOS, BILITOT, PROT, ALBUMIN in the last 168 hours. No results for input(s): LIPASE, AMYLASE in the last 168 hours. No results for input(s): AMMONIA in the last 168 hours.  ABG    Component Value Date/Time   PHART 7.420 09/11/2019 0512   PCO2ART 46.7 09/11/2019 0512   PO2ART 65.0 (L) 09/11/2019 0512   HCO3 30.3 (H) 09/11/2019 0512   TCO2 32 09/11/2019 0512   ACIDBASEDEF 2.0 08/28/2019 1519   O2SAT 93.0 09/11/2019 0512     Coagulation Profile: No results for input(s): INR, PROTIME in the last 168  hours.  Cardiac Enzymes: No results for input(s): CKTOTAL, CKMB, CKMBINDEX, TROPONINI in the last 168 hours.  HbA1C: Hgb A1c MFr Bld  Date/Time Value Ref Range Status  07/24/2019 04:50 AM 6.7 (H) 4.8 - 5.6 % Final    Comment:    (NOTE) Pre diabetes:          5.7%-6.4% Diabetes:              >6.4% Glycemic control for   <7.0% adults with diabetes   09/30/2017 05:36 AM 5.7 (H) 4.8 - 5.6 % Final    Comment:    (NOTE)         Prediabetes: 5.7 - 6.4         Diabetes: >6.4         Glycemic control for adults with diabetes: <7.0     CBG: Recent Labs  Lab 09/10/19 1126 09/10/19 1605 09/10/19 2046 09/10/19 2339 09/11/19 0512  GLUCAP 147* 147* 122* 120* 136*   CRITICAL CARE Performed by: Kipp Brood   Total critical care time: 50 minutes  Critical care time was exclusive of separately billable procedures and treating other patients.  Critical care was necessary to treat or prevent imminent or life-threatening deterioration.  Critical care was time spent personally by me on the following activities: development of treatment plan with patient and/or surrogate as well as nursing, discussions with consultants, evaluation of patient's response to treatment, examination of patient, obtaining history from patient or surrogate, ordering and performing treatments and interventions, ordering and review of laboratory studies, ordering and review of radiographic studies, pulse oximetry, re-evaluation of patient's condition and participation in multidisciplinary rounds.  Kipp Brood, MD [facility] ICU Physician [place]  Pager: [phone] Mobile: [phone] After hours: [phone].   09/11/2019 7:48 AM

## 2019-09-11 NOTE — H&P (Signed)
History and Physical Interval Note:  09/11/2019 9:54 AM  [name]  has presented today for surgery, with the diagnosis of ACUTE RESPIRATORY DISTRESS SYNDROME.  The various methods of treatment have been discussed with the patient and family. After consideration of risks, benefits and other options for treatment, the patient has consented to  Percutaneous endoscopic gastrostomy tube insertion as a surgical intervention.  The patient's history has been reviewed, patient examined, no change in status, stable for surgery.  I have reviewed the patient's chart and labs.  Questions were answered to the patient's family's satisfaction.     Wonda Olds

## 2019-09-11 NOTE — CV Procedure (Addendum)
Date of service 2/3  ECMO NOTE:  Indication: Respiratory failure due to COVID PNA  Initial cannulation date: 08/03/2019  ECMO type: VV ECMO (Cardio-Help)  Dual lumen  Inflow/return cannula:  1) 32 FR Crescent placed 2/2  ECMO events:  Initial cannulation 12/26 Circuit changed and trach exchanged on 1/8.   Underwent addition of a RIJ drainage cannula on 1/20 Circuit changed on 1/22 for rising LDH Had evidence of recirculation and RIJ cannula pulled back on 1/26 19 FR RIJ removed 09/06/19 Underwent change to [facility] catheter with removal of femoral lines 2/2  Daily data:  Remains on VV ecmo with good flows.   Flow 5.13 RPM 3650 dP 31 Pven  -105 Sweep 9  SVO2 67.8%  Labs:  Hgb 8.5 LDH 450 -> 481 -> 508 -> 557 -> 555 -> 469 -> 367 Heparin level: 0.55  Plan:  Crescent cannula working well Excellent flow and sats CXR improving Sedation being weaned. Hold lasix with AKI Follow cultures Bronch today Continue to wean sweep gas and NE as tolerated   Glori Bickers, MD  8:34 AM

## 2019-09-12 ENCOUNTER — Inpatient Hospital Stay (HOSPITAL_COMMUNITY): Payer: Managed Care, Other (non HMO)

## 2019-09-12 ENCOUNTER — Encounter: Payer: Self-pay | Admitting: *Deleted

## 2019-09-12 LAB — CULTURE, BLOOD (ROUTINE X 2)
Culture: NO GROWTH
Culture: NO GROWTH
Special Requests: ADEQUATE
Special Requests: ADEQUATE

## 2019-09-12 LAB — BPAM RBC
Blood Product Expiration Date: 202102242359
Blood Product Expiration Date: 202102242359
Blood Product Expiration Date: 202102242359
Blood Product Expiration Date: 202102242359
Blood Product Expiration Date: 202102242359
Blood Product Expiration Date: 202102282359
Blood Product Expiration Date: 202102282359
Blood Product Expiration Date: 202102282359
Blood Product Expiration Date: 202102282359
ISSUE DATE / TIME: 202102010846
ISSUE DATE / TIME: 202102021101
ISSUE DATE / TIME: 202102021101
ISSUE DATE / TIME: 202102030915
ISSUE DATE / TIME: 202102030915
ISSUE DATE / TIME: 202102030915
ISSUE DATE / TIME: 202102030915
Unit Type and Rh: 5100
Unit Type and Rh: 5100
Unit Type and Rh: 5100
Unit Type and Rh: 5100
Unit Type and Rh: 5100
Unit Type and Rh: 5100
Unit Type and Rh: 5100
Unit Type and Rh: 5100
Unit Type and Rh: 5100

## 2019-09-12 LAB — POCT I-STAT 7, (LYTES, BLD GAS, ICA,H+H)
Acid-Base Excess: 3 mmol/L — ABNORMAL HIGH (ref 0.0–2.0)
Acid-Base Excess: 4 mmol/L — ABNORMAL HIGH (ref 0.0–2.0)
Acid-Base Excess: 5 mmol/L — ABNORMAL HIGH (ref 0.0–2.0)
Acid-Base Excess: 5 mmol/L — ABNORMAL HIGH (ref 0.0–2.0)
Acid-Base Excess: 5 mmol/L — ABNORMAL HIGH (ref 0.0–2.0)
Acid-Base Excess: 5 mmol/L — ABNORMAL HIGH (ref 0.0–2.0)
Acid-Base Excess: 5 mmol/L — ABNORMAL HIGH (ref 0.0–2.0)
Acid-Base Excess: 6 mmol/L — ABNORMAL HIGH (ref 0.0–2.0)
Acid-Base Excess: 6 mmol/L — ABNORMAL HIGH (ref 0.0–2.0)
Bicarbonate: 29.6 mmol/L — ABNORMAL HIGH (ref 20.0–28.0)
Bicarbonate: 30.1 mmol/L — ABNORMAL HIGH (ref 20.0–28.0)
Bicarbonate: 30.2 mmol/L — ABNORMAL HIGH (ref 20.0–28.0)
Bicarbonate: 30.4 mmol/L — ABNORMAL HIGH (ref 20.0–28.0)
Bicarbonate: 30.5 mmol/L — ABNORMAL HIGH (ref 20.0–28.0)
Bicarbonate: 30.5 mmol/L — ABNORMAL HIGH (ref 20.0–28.0)
Bicarbonate: 30.9 mmol/L — ABNORMAL HIGH (ref 20.0–28.0)
Bicarbonate: 31.6 mmol/L — ABNORMAL HIGH (ref 20.0–28.0)
Bicarbonate: 31.7 mmol/L — ABNORMAL HIGH (ref 20.0–28.0)
Calcium, Ion: 1.29 mmol/L (ref 1.15–1.40)
Calcium, Ion: 1.31 mmol/L (ref 1.15–1.40)
Calcium, Ion: 1.32 mmol/L (ref 1.15–1.40)
Calcium, Ion: 1.33 mmol/L (ref 1.15–1.40)
Calcium, Ion: 1.34 mmol/L (ref 1.15–1.40)
Calcium, Ion: 1.35 mmol/L (ref 1.15–1.40)
Calcium, Ion: 1.35 mmol/L (ref 1.15–1.40)
Calcium, Ion: 1.35 mmol/L (ref 1.15–1.40)
Calcium, Ion: 1.37 mmol/L (ref 1.15–1.40)
HCT: 23 % — ABNORMAL LOW (ref 39.0–52.0)
HCT: 23 % — ABNORMAL LOW (ref 39.0–52.0)
HCT: 23 % — ABNORMAL LOW (ref 39.0–52.0)
HCT: 24 % — ABNORMAL LOW (ref 39.0–52.0)
HCT: 24 % — ABNORMAL LOW (ref 39.0–52.0)
HCT: 25 % — ABNORMAL LOW (ref 39.0–52.0)
HCT: 25 % — ABNORMAL LOW (ref 39.0–52.0)
HCT: 25 % — ABNORMAL LOW (ref 39.0–52.0)
HCT: 25 % — ABNORMAL LOW (ref 39.0–52.0)
Hemoglobin: 7.8 g/dL — ABNORMAL LOW (ref 13.0–17.0)
Hemoglobin: 7.8 g/dL — ABNORMAL LOW (ref 13.0–17.0)
Hemoglobin: 7.8 g/dL — ABNORMAL LOW (ref 13.0–17.0)
Hemoglobin: 8.2 g/dL — ABNORMAL LOW (ref 13.0–17.0)
Hemoglobin: 8.2 g/dL — ABNORMAL LOW (ref 13.0–17.0)
Hemoglobin: 8.5 g/dL — ABNORMAL LOW (ref 13.0–17.0)
Hemoglobin: 8.5 g/dL — ABNORMAL LOW (ref 13.0–17.0)
Hemoglobin: 8.5 g/dL — ABNORMAL LOW (ref 13.0–17.0)
Hemoglobin: 8.5 g/dL — ABNORMAL LOW (ref 13.0–17.0)
O2 Saturation: 84 %
O2 Saturation: 89 %
O2 Saturation: 94 %
O2 Saturation: 94 %
O2 Saturation: 95 %
O2 Saturation: 96 %
O2 Saturation: 96 %
O2 Saturation: 96 %
O2 Saturation: 97 %
Patient temperature: 36.6
Patient temperature: 36.6
Patient temperature: 36.6
Patient temperature: 36.7
Patient temperature: 36.7
Patient temperature: 36.9
Patient temperature: 36.9
Patient temperature: 36.9
Patient temperature: 37
Potassium: 3.9 mmol/L (ref 3.5–5.1)
Potassium: 4 mmol/L (ref 3.5–5.1)
Potassium: 4.1 mmol/L (ref 3.5–5.1)
Potassium: 4.1 mmol/L (ref 3.5–5.1)
Potassium: 4.2 mmol/L (ref 3.5–5.1)
Potassium: 4.2 mmol/L (ref 3.5–5.1)
Potassium: 4.3 mmol/L (ref 3.5–5.1)
Potassium: 4.3 mmol/L (ref 3.5–5.1)
Potassium: 4.5 mmol/L (ref 3.5–5.1)
Sodium: 151 mmol/L — ABNORMAL HIGH (ref 135–145)
Sodium: 152 mmol/L — ABNORMAL HIGH (ref 135–145)
Sodium: 153 mmol/L — ABNORMAL HIGH (ref 135–145)
Sodium: 153 mmol/L — ABNORMAL HIGH (ref 135–145)
Sodium: 154 mmol/L — ABNORMAL HIGH (ref 135–145)
Sodium: 154 mmol/L — ABNORMAL HIGH (ref 135–145)
Sodium: 154 mmol/L — ABNORMAL HIGH (ref 135–145)
Sodium: 155 mmol/L — ABNORMAL HIGH (ref 135–145)
Sodium: 155 mmol/L — ABNORMAL HIGH (ref 135–145)
TCO2: 31 mmol/L (ref 22–32)
TCO2: 32 mmol/L (ref 22–32)
TCO2: 32 mmol/L (ref 22–32)
TCO2: 32 mmol/L (ref 22–32)
TCO2: 32 mmol/L (ref 22–32)
TCO2: 32 mmol/L (ref 22–32)
TCO2: 33 mmol/L — ABNORMAL HIGH (ref 22–32)
TCO2: 33 mmol/L — ABNORMAL HIGH (ref 22–32)
TCO2: 34 mmol/L — ABNORMAL HIGH (ref 22–32)
pCO2 arterial: 45.4 mmHg (ref 32.0–48.0)
pCO2 arterial: 46.1 mmHg (ref 32.0–48.0)
pCO2 arterial: 48.6 mmHg — ABNORMAL HIGH (ref 32.0–48.0)
pCO2 arterial: 50.5 mmHg — ABNORMAL HIGH (ref 32.0–48.0)
pCO2 arterial: 52.1 mmHg — ABNORMAL HIGH (ref 32.0–48.0)
pCO2 arterial: 52.6 mmHg — ABNORMAL HIGH (ref 32.0–48.0)
pCO2 arterial: 53.2 mmHg — ABNORMAL HIGH (ref 32.0–48.0)
pCO2 arterial: 54.1 mmHg — ABNORMAL HIGH (ref 32.0–48.0)
pCO2 arterial: 65.9 mmHg (ref 32.0–48.0)
pH, Arterial: 7.287 — ABNORMAL LOW (ref 7.350–7.450)
pH, Arterial: 7.357 (ref 7.350–7.450)
pH, Arterial: 7.365 (ref 7.350–7.450)
pH, Arterial: 7.375 (ref 7.350–7.450)
pH, Arterial: 7.381 (ref 7.350–7.450)
pH, Arterial: 7.387 (ref 7.350–7.450)
pH, Arterial: 7.399 (ref 7.350–7.450)
pH, Arterial: 7.424 (ref 7.350–7.450)
pH, Arterial: 7.434 (ref 7.350–7.450)
pO2, Arterial: 55 mmHg — ABNORMAL LOW (ref 83.0–108.0)
pO2, Arterial: 57 mmHg — ABNORMAL LOW (ref 83.0–108.0)
pO2, Arterial: 67 mmHg — ABNORMAL LOW (ref 83.0–108.0)
pO2, Arterial: 73 mmHg — ABNORMAL LOW (ref 83.0–108.0)
pO2, Arterial: 74 mmHg — ABNORMAL LOW (ref 83.0–108.0)
pO2, Arterial: 84 mmHg (ref 83.0–108.0)
pO2, Arterial: 84 mmHg (ref 83.0–108.0)
pO2, Arterial: 84 mmHg (ref 83.0–108.0)
pO2, Arterial: 91 mmHg (ref 83.0–108.0)

## 2019-09-12 LAB — BASIC METABOLIC PANEL
Anion gap: 12 (ref 5–15)
Anion gap: 13 (ref 5–15)
BUN: 98 mg/dL — ABNORMAL HIGH (ref 6–20)
BUN: 96 mg/dL — ABNORMAL HIGH (ref 6–20)
CO2: 29 mmol/L (ref 22–32)
CO2: 27 mmol/L (ref 22–32)
Calcium: 9.6 mg/dL (ref 8.9–10.3)
Calcium: 9.3 mg/dL (ref 8.9–10.3)
Chloride: 111 mmol/L (ref 98–111)
Chloride: 113 mmol/L — ABNORMAL HIGH (ref 98–111)
Creatinine, Ser: 1.34 mg/dL — ABNORMAL HIGH (ref 0.61–1.24)
Creatinine, Ser: 1.33 mg/dL — ABNORMAL HIGH (ref 0.61–1.24)
GFR calc Af Amer: >60 mL/min (ref >60)
GFR calc Af Amer: >60 mL/min (ref >60)
GFR calc non Af Amer: >60 mL/min (ref >60)
GFR calc non Af Amer: >60 mL/min (ref >60)
Glucose, Bld: 133 mg/dL — ABNORMAL HIGH (ref 70–99)
Glucose, Bld: 116 mg/dL — ABNORMAL HIGH (ref 70–99)
Potassium: 3.9 mmol/L (ref 3.5–5.1)
Potassium: 4.1 mmol/L (ref 3.5–5.1)
Sodium: 152 mmol/L — ABNORMAL HIGH (ref 135–145)
Sodium: 153 mmol/L — ABNORMAL HIGH (ref 135–145)

## 2019-09-12 LAB — PREPARE FRESH FROZEN PLASMA: Unit division: 0

## 2019-09-12 LAB — CBC
HCT: 28 % — ABNORMAL LOW (ref 39.0–52.0)
HCT: 28 % — ABNORMAL LOW (ref 39.0–52.0)
Hemoglobin: 8.3 g/dL — ABNORMAL LOW (ref 13.0–17.0)
Hemoglobin: 8.4 g/dL — ABNORMAL LOW (ref 13.0–17.0)
MCH: 27.9 pg (ref 26.0–34.0)
MCH: 28.9 pg (ref 26.0–34.0)
MCHC: 29.6 g/dL — ABNORMAL LOW (ref 30.0–36.0)
MCHC: 30 g/dL (ref 30.0–36.0)
MCV: 94.3 fL (ref 80.0–100.0)
MCV: 96.2 fL (ref 80.0–100.0)
Platelets: 158 10*3/uL (ref 150–400)
Platelets: 139 10*3/uL — ABNORMAL LOW (ref 150–400)
RBC: 2.97 MIL/uL — ABNORMAL LOW (ref 4.22–5.81)
RBC: 2.91 MIL/uL — ABNORMAL LOW (ref 4.22–5.81)
RDW: 18.6 % — ABNORMAL HIGH (ref 11.5–15.5)
RDW: 18.7 % — ABNORMAL HIGH (ref 11.5–15.5)
WBC: 15.2 10*3/uL — ABNORMAL HIGH (ref 4.0–10.5)
WBC: 11.7 10*3/uL — ABNORMAL HIGH (ref 4.0–10.5)
nRBC: 1.2 % — ABNORMAL HIGH (ref 0.0–0.2)
nRBC: 1.1 % — ABNORMAL HIGH (ref 0.0–0.2)

## 2019-09-12 LAB — TYPE AND SCREEN
ABO/RH(D): O POS
Antibody Screen: NEGATIVE
Unit division: 0
Unit division: 0
Unit division: 0
Unit division: 0
Unit division: 0
Unit division: 0
Unit division: 0
Unit division: 0
Unit division: 0

## 2019-09-12 LAB — GLUCOSE, CAPILLARY
Glucose-Capillary: 101 mg/dL — ABNORMAL HIGH (ref 70–99)
Glucose-Capillary: 114 mg/dL — ABNORMAL HIGH (ref 70–99)
Glucose-Capillary: 116 mg/dL — ABNORMAL HIGH (ref 70–99)
Glucose-Capillary: 127 mg/dL — ABNORMAL HIGH (ref 70–99)
Glucose-Capillary: 136 mg/dL — ABNORMAL HIGH (ref 70–99)
Glucose-Capillary: 142 mg/dL — ABNORMAL HIGH (ref 70–99)
Glucose-Capillary: 153 mg/dL — ABNORMAL HIGH (ref 70–99)

## 2019-09-12 LAB — CULTURE, BAL-QUANTITATIVE W GRAM STAIN: Culture: 30000 — AB

## 2019-09-12 LAB — CULTURE, RESPIRATORY W GRAM STAIN

## 2019-09-12 LAB — APTT
aPTT: 125 seconds — ABNORMAL HIGH (ref 24–36)
aPTT: 162 seconds (ref 24–36)

## 2019-09-12 LAB — COPPER, SERUM: Copper: 93 ug/dL (ref 69–132)

## 2019-09-12 LAB — COOXEMETRY PANEL
Carboxyhemoglobin: 3.4 % — ABNORMAL HIGH (ref 0.5–1.5)
Methemoglobin: 0.7 % (ref 0.0–1.5)
O2 Saturation: 72.2 %
Total hemoglobin: 8.4 g/dL — ABNORMAL LOW (ref 12.0–16.0)

## 2019-09-12 LAB — HEPARIN LEVEL (UNFRACTIONATED)
Heparin Unfractionated: 0.55 IU/mL (ref 0.30–0.70)
Heparin Unfractionated: 0.65 IU/mL (ref 0.30–0.70)
Heparin Unfractionated: 0.57 IU/mL (ref 0.30–0.70)

## 2019-09-12 LAB — FIBRINOGEN: Fibrinogen: 753 mg/dL — ABNORMAL HIGH (ref 210–475)

## 2019-09-12 LAB — ZINC: Zinc: 126 ug/dL — ABNORMAL HIGH (ref 44–115)

## 2019-09-12 LAB — LACTATE DEHYDROGENASE: LDH: 367 U/L — ABNORMAL HIGH (ref 98–192)

## 2019-09-12 LAB — BPAM FFP
Blood Product Expiration Date: 202102042359
ISSUE DATE / TIME: 202102030917
Unit Type and Rh: 6200

## 2019-09-12 LAB — ASPERGILLUS ANTIGEN, BAL/SERUM: Aspergillus Ag, BAL/Serum: 0.09 Index (ref 0.00–0.49)

## 2019-09-12 LAB — LACTIC ACID, PLASMA: Lactic Acid, Venous: 1.1 mmol/L (ref 0.5–1.9)

## 2019-09-12 MED ORDER — FUROSEMIDE 10 MG/ML IJ SOLN
20.0000 mg | Freq: Once | INTRAMUSCULAR | Status: AC
  Administered 2019-09-12: 20 mg via INTRAVENOUS

## 2019-09-12 MED ORDER — POTASSIUM CHLORIDE 20 MEQ/15ML (10%) PO SOLN
40.0000 meq | Freq: Once | ORAL | Status: AC
  Administered 2019-09-12: 06:00:00 40 meq
  Filled 2019-09-12: qty 30

## 2019-09-12 MED ORDER — SODIUM CHLORIDE 0.9 % IV SOLN
2.0000 g | INTRAVENOUS | Status: DC
  Administered 2019-09-13: 2 g via INTRAVENOUS
  Filled 2019-09-12: qty 2

## 2019-09-12 MED ORDER — ROCURONIUM BROMIDE 50 MG/5ML IV SOLN: 80.0000 mg | Freq: Once | INTRAVENOUS | Status: AC

## 2019-09-12 MED ORDER — ROCURONIUM BROMIDE 10 MG/ML (PF) SYRINGE
PREFILLED_SYRINGE | INTRAVENOUS | Status: AC
  Administered 2019-09-12: 80 mg
  Filled 2019-09-12: qty 10

## 2019-09-12 MED ORDER — FUROSEMIDE 10 MG/ML IJ SOLN
INTRAMUSCULAR | Status: AC
  Filled 2019-09-12: qty 2

## 2019-09-12 NOTE — Progress Notes (Addendum)
ANTICOAGULATION CONSULT NOTE - Follow Up Consult  Pharmacy Consult for heparin Indication: ECMO  Labs: Recent Labs    09/11/19 0458 09/11/19 0512 09/11/19 1739 09/11/19 1741 09/11/19 1954 09/12/19 0408 09/12/19 0409 09/12/19 0434 09/12/19 1633 09/12/19 1633 09/12/19 1635 09/12/19 1640 09/12/19 2007 09/12/19 2303 09/12/19 2331  HGB 9.4*   < > 8.8*   < >  --  8.3*  --    < > 8.4*   < >  --   --  8.2*  --  7.8*  HCT 30.8*   < > 29.6*   < >  --  28.0*  --    < > 28.0*  --   --   --  24.0*  --  23.0*  PLT 174   < > 153  --   --  158  --   --  139*  --   --   --   --   --   --   APTT 130*  --   --   --   --   --  125*  --   --   --  162*  --   --   --   --   HEPARINUNFRC 0.53  --   --    < >   < >  --  0.55  --   --   --  0.65  --   --  0.57  --   CREATININE 1.60*   < > 1.36*  --   --  1.34*  --   --   --   --   --  1.33*  --   --   --    < > = values in this interval not displayed.    Assessment: 44yo male remains supratherapeutic on heparin despite rate change; no gtt issues or signs of bleeding other than slight drainage at back of throat per RN.  Goal of Therapy:  Heparin level 0.3-0.5 units/ml (aiming for lowest end)   Plan:  Will decrease heparin gtt by 3 units/kgABW/hr to 1800 units/hr and check level with next scheduled lab draw.    Wynona Neat, PharmD, BCPS  09/12/2019,11:55 PM   Addendum: Next level at goal, 0.4; may continue to decrease since lab was drawn just a few hours after rate change.  Noted that pt had a large clot retrieved from the back of his mouth/throat.  Will continue heparin gtt at current rate and confirm stable with additional level.   VB 09/13/2019 5:37 AM

## 2019-09-12 NOTE — Consult Note (Addendum)
WOC Nurse Consult Note: Reason for Consult: Lateral aspect of plantar surface on bilateral feet are noted to be with purple/black discoloration. Right foot discoloration is firm. Patient's mother and Bedside RN note that patient pronates when ambulating and that while positioned in the standing position for increased perfusion, his weight was sustained on the lateral aspects of the plantar foot bilaterally. Wound type: Pressure, perfusion Pressure Injury POA: No Measurement: Right lateral foot, plantar aspect:  14cm x 6cm with hard, dark purple to black discoloration. Tissue is not pliable and resembles eschar formation. Left foot, plantar aspect: 16cm x 8cm at metatarsal heads, 5cm width at midfoot, dark purple discoloration. Tissue is pliable. Wound bed: No break in skin Drainage (amount, consistency, odor) None Periwound: Very dry Dressing procedure/placement/frequency: Discussed options with the Bedside RN.  Ortho Tech has very recently provided [place]co shoes for increased stability in the event standing of the patient is required. My recommendation is for silicone foam dressings to be placed over the affected areas prior to Unna's Boots being applied tomorrow for anticipated pressure redistribution during standing. The Nursing staff will continue to monitor; if you desire to have [place] Team monitor along with you, please contact us after Unna's Boots have been removed and prior to Ortho Tech reapplying them on Fridays. For the nurses covering the [facility], please check the AMION schedule.  Patient's mother is at the bedside and I introduce myself and explain the role of the [place] Nurse.  She is very involved in her son's care. I have informed her of yesterday's finding of a pressure injury in the skin fold of the posterior neck and I show her today's finding of the bilateral feet DTPI (deep tissue pressure injuries). She reports that her son pronates and that she believes that the injuries  occurred when he was positioned standing to improve his breathing. I explained the preventive POC, demonstrating the silicone foams that will be applied to his feet and she is in agreement.  [place] nursing team will continue to follow along with you, seeing every 7-10 days, and will remain available to this patient, the nursing and medical teams.  Please re-consult if needed at any time in between visits.  Thanks, Maudie Flakes, MSN, RN, [place], [facility]  Pager# [phone]

## 2019-09-12 NOTE — Progress Notes (Signed)
Orthopedic Tech Progress Note Patient Details:  [name] 09-25-1975 [id number]  Ortho Devices Type of Ortho Device: Postop shoe/boot Ortho Device/Splint Location: bilateral Ortho Device/Splint Interventions: Ordered, Application   Post Interventions Patient Tolerated: Well Instructions Provided: Care of device, Adjustment of device   Janit Pagan 09/12/2019, 10:57 AM

## 2019-09-12 NOTE — Progress Notes (Signed)
Patient ID: [name], male   DOB: [birth year], 44 y.o.   MRN: [id number]    Advanced Heart Failure Rounding Note   Subjective:    Circuit changed and trach exchanged on 1/8.   Underwent addition of a RIJ drainage cannula on 1/20 Circuit changed on 1/22 for rising LDH Had evidence of recirculation and RIJ cannula pulled back on 1/26 Underwent change to [name] catheter with removal of femoral lines on 2/2  Remains on VV ecmo with good flows.   Sats mind 90s  ABG 7.43/45/67/95%  Co-ox 72%   On vent now at 50% PEEP 15. PlatP 30, Bi-vent setting.TVs ~250cc Personally reviewed  CXR much improved. Crescent catheter well positioned   Underwent G tube placement yesterday. Off lasix. Weaning sedation. Creatinine improved.   hgb 9.5 wbc 18.2 -> 15.2. Off abx. Sodium 152  ECMO parameters - see separate ecmo note   Objective:   Weight Range:  Vital Signs:   Temp:  [97.9 F (36.6 C)-98.2 F (36.8 C)] 98.2 F (36.8 C) (02/03 1600) Pulse Rate:  [43-142] 72 (02/04 0630) Resp:  [9-30] 16 (02/04 0630) BP: (121-141)/(57-58) 141/57 (02/03 1605) SpO2:  [90 %-100 %] 98 % (02/04 0630) Arterial Line BP: (89-159)/(34-63) 115/48 (02/04 0630) FiO2 (%):  [60 %] 60 % (02/04 0355) Weight:  [137.1 kg] 137.1 kg (02/04 0500) Last BM Date: 09/11/19  Weight change: Filed Weights   09/10/19 0348 09/11/19 0500 09/12/19 0500  Weight: (!) 150.8 kg (!) 148.5 kg (!) 137.1 kg    Intake/Output:   Intake/Output Summary (Last 24 hours) at 09/12/2019 0803 Last data filed at 09/12/2019 0700 Gross per 24 hour  Intake 2655.26 ml  Output 2695 ml  Net -39.74 ml     Physical Exam: General:  Sedated on vent  HEENT: normal Neck: supple. RIJ VV crescent cannula Carotids 2+ bilat; no bruits. No lymphadenopathy or thryomegaly appreciated. Cor: PMI nondisplaced. Regular rate & rhythm. No rubs, gallops or murmurs. Lungs: minimal air movement  Abdomen: obese soft, nontender, nondistended. No  hepatosplenomegaly. No bruits or masses. Good bowel sounds. + G-tube Extremities: no cyanosis, clubbing, rash, 1+  Edema  Neuro: sedated on vent    Telemetry: Sinus 70-80s  Personally reviewed  Labs: Basic Metabolic Panel: Recent Labs  Lab 09/07/19 0433 09/07/19 0443 09/10/19 0407 09/10/19 0419 09/10/19 0928 09/10/19 0955 09/10/19 1600 09/10/19 1600 09/10/19 1608 09/11/19 0228 09/11/19 0458 09/11/19 0512 09/11/19 1739 09/11/19 1739 09/11/19 1741 09/11/19 2217 09/12/19 0000 09/12/19 0408 09/12/19 0434  NA 151*   < > 149*   < > 151*   < > 152*   < >   < >  --  151*   < > 152*   < > 153* 152* 151* 152* 152*  K 4.2   < > 3.9   < > 3.6   < > 3.7   < >   < >  --  4.1   < > 3.9   < > 4.0 4.2 4.1 3.9 3.9  CL 107   < > 107   < > 107  --  108  --   --   --  109  --  108  --   --   --   --  111  --   CO2 30   < > 27  --   --   --  28  --   --   --  28  --  27  --   --   --   --  29  --   GLUCOSE 163*   < > 124*   < > 139*  --  152*  --   --   --  134*  --  124*  --   --   --   --  133*  --   BUN 81*   < > 123*   < > 119*  --  116*  --   --   --  111*  --  103*  --   --   --   --  98*  --   CREATININE 0.99   < > 1.44*   < > 1.40*  --  1.49*  --   --   --  1.60*  --  1.36*  --   --   --   --  1.34*  --   CALCIUM 9.4   < > 9.9   < >  --   --  9.6   < >  --   --  9.5  --  9.6  --   --   --   --  9.6  --   MG 2.8*  --   --   --   --   --   --   --   --  2.5*  --   --   --   --   --   --   --   --   --    < > = values in this interval not displayed.    Liver Function Tests: No results for input(s): AST, ALT, ALKPHOS, BILITOT, PROT, ALBUMIN in the last 168 hours. No results for input(s): LIPASE, AMYLASE in the last 168 hours. No results for input(s): AMMONIA in the last 168 hours.  CBC: Recent Labs  Lab 09/10/19 0407 09/10/19 0419 09/10/19 1835 09/10/19 1838 09/11/19 0458 09/11/19 0512 09/11/19 1739 09/11/19 1739 09/11/19 1741 09/11/19 2217 09/12/19 0000 09/12/19 0408  09/12/19 0434  WBC 18.0*  --  18.4*  --  18.2*  --  17.3*  --   --   --   --  15.2*  --   HGB 9.0*   < > 9.7*   < > 9.4*   < > 8.8*   < > 9.2* 8.5* 7.8* 8.3* 8.5*  HCT 29.9*   < > 31.4*   < > 30.8*   < > 29.6*   < > 27.0* 25.0* 23.0* 28.0* 25.0*  MCV 94.0  --  91.8  --  92.8  --  94.3  --   --   --   --  94.3  --   PLT 136*  --  161  --  174  --  153  --   --   --   --  158  --    < > = values in this interval not displayed.    Cardiac Enzymes: No results for input(s): CKTOTAL, CKMB, CKMBINDEX, TROPONINI in the last 168 hours.  BNP: BNP (last 3 results) Recent Labs    07/22/19 1039  BNP 15.3    ProBNP (last 3 results) No results for input(s): PROBNP in the last 8760 hours.    Other results:  Imaging: DG Chest 1 View  Result Date: 09/10/2019 CLINICAL DATA:  History of heart surgery.  ECMO. EXAM: CHEST  1 VIEW COMPARISON:  Prior study same day.  Prior study 09/09/2019. FINDINGS: Endotracheal tube, left subclavian left, right PICC line, bilateral chest tubes in stable position. ECMO  device noted projected over the right chest. Cardiomegaly. Diffuse severe bilateral pulmonary infiltrates are again noted. No prominent pleural effusion noted. No pneumothorax. IMPRESSION: 1. Lines and tubes as above. Bilateral chest tubes in stable position. No pneumothorax. 2.  Stable cardiomegaly. 3.  Diffuse unchanged dense bilateral pulmonary infiltrates/edema. Electronically Signed   By: Marcello Moores  Register   On: 09/10/2019 11:02   DG Chest Port 1 View  Result Date: 09/11/2019 CLINICAL DATA:  44 year old male with a history of COVID pneumonia and ECMO EXAM: PORTABLE CHEST 1 VIEW COMPARISON:  09/10/2019 FINDINGS: Cardiomediastinal silhouette likely unchanged with cardiomegaly. Low lung volumes persist with bilateral reticulonodular opacities, similar in severity to the most recent plain film. There has been slight improved aeration when compared over time to the plain film sequence of 08/31/2019,  09/01/2019, 09/02/2019. Bilateral thoracostomy tubes are unchanged. Right IJ ECMO cannula, terminating out of the field of view in the abdomen. Left subclavian central venous catheter, with the tip appearing to terminate superior vena cava. Tracheostomy tube unchanged. IMPRESSION: Unchanged right IJ venous ECMO cannula, left subclavian central venous catheter, bilateral thoracostomy tubes, and tracheostomy. Low lung volumes persist, with persisting bilateral reticulonodular opacities, and slight improved aeration when compared to plain film sequence of January 23-25. Electronically Signed   By: Corrie Mckusick D.O.   On: 09/11/2019 08:42   DG CHEST PORT 1 VIEW  Result Date: 09/10/2019 CLINICAL DATA:  Central line placement. EXAM: PORTABLE CHEST 1 VIEW COMPARISON:  Same day. FINDINGS: Stable cardiomediastinal silhouette. Tracheostomy tube is unchanged in position. Left subclavian catheter is unchanged. Bilateral chest tubes are noted without pneumothorax. Stable bilateral diffuse lung opacities are noted most consistent with multifocal pneumonia or edema. Bony thorax is unremarkable. IMPRESSION: 1. Stable support apparatus. Stable bilateral lung opacities as described above. 2. No evidence of pneumothorax. Electronically Signed   By: Marijo Conception M.D.   On: 09/10/2019 15:15     Medications:     Scheduled Medications: . sodium chloride   Intravenous Once  . vitamin C  500 mg Per Tube Daily  . chlorhexidine gluconate (MEDLINE KIT)  15 mL Mouth Rinse BID  . Chlorhexidine Gluconate Cloth  6 each Topical Daily  . diazepam  20 mg Per Tube Q6H  . feeding supplement (PRO-STAT SUGAR FREE 64)  60 mL Per Tube TID  . free water  300 mL Per Tube Q8H  . gabapentin  200 mg Per Tube Q8H  . HYDROmorphone  16 mg Per Tube Q4H  . insulin aspart  0-20 Units Subcutaneous Q4H  . insulin detemir  20 Units Subcutaneous BID  . ipratropium-albuterol  3 mL Nebulization Q4H  . mouth rinse  15 mL Mouth Rinse 10 times per  day  . montelukast  10 mg Per Tube QHS  . oxymetazoline  1 spray Each Nare BID  . pantoprazole sodium  40 mg Per Tube QHS  . propranolol  20 mg Per Tube TID  . QUEtiapine  50 mg Per Tube BID  . rocuronium bromide      . sodium chloride flush  10-40 mL Intracatheter Q12H  . Thrombi-Pad  1 each Topical Once  . Thrombi-Pad  1 each Topical Once  . white petrolatum   Topical BID  . zinc sulfate  220 mg Per Tube Daily    Infusions: . sodium chloride    . sodium chloride Stopped (09/05/19 1900)  . sodium chloride 10 mL/hr at 09/11/19 2156  . albumin human Stopped (09/09/19 0518)  . amiodarone  30 mg/hr (09/12/19 0700)  . dexmedetomidine (PRECEDEX) IV infusion 1.4 mcg/kg/hr (09/12/19 0700)  . feeding supplement (PIVOT 1.5 CAL)    . heparin 2,400 Units/hr (09/12/19 0700)  . HYDROmorphone 8 mg/hr (09/12/19 0700)  . midazolam 8 mg/hr (09/12/19 0700)  . norepinephrine 11 mcg/min (09/12/19 0700)    PRN Medications: Place/Maintain arterial line **AND** sodium chloride, sodium chloride, sodium chloride, acetaminophen (TYLENOL) oral liquid 160 mg/5 mL, acetaminophen, albumin human, hydrALAZINE, HYDROmorphone, magic mouthwash, magnesium hydroxide, midazolam, ondansetron (ZOFRAN) IV, senna-docusate, sodium chloride   Assessment/Plan:    1. Acute hypoxic/hypercarbic respiratory failure due to COVID PNA: Remained markedly hypoxic despite full support. Intubated 12/26. S/p bilateral CTs 12/26 for pneumothoraces.  TEE on 12/26 LVEF 70% RV ok. VV ECMO begun 12/26. Tracheostomy 12/29.  COVID ARDS at this point. ECMO circuit changed 1/8 & 1/23. Crescent cannula placed 2/2 (femoral cannuals removed) - ECMO parameters as per ECMO note - Flows look very good on [name]. Sats and CXR improved - Able to come down on sedation significantly. Will continue wean (GENTLY) as tolerated - LDH 367 - Continue heparin. Discussed dosing with PharmD personally. - Off lasix gtt due to azotemia. Will hold again  today - Bronch by CCM today - Then begin to wean sedation and try to wean sweep gas further - Continue chest PT - Sweep down to 9 continue to wean.   2. Bilateral PTX: Due to barotrauma.  - Driving pressure minimized at < 15 Stable. No change  3. COVID PNA: management of COVID infection per CCM. CXR with bilateral multifocal PNA progressing to likely ARDS.  - s/p 10 days of remdesivir - 12/16, 12/2 s/p tociluzimab - 12/17 s/p convalescent plasma - Decadron at 110m/daily completed 1/11 - Completed cefepime/vanc - Off Ancef  4. ID: Empiric coverage with vancomycin/cefepime initially for possible secondary bacterial PNA, course completed.   - treated with cefepime and vanc for + cx with staph epidimidis, MSSA and serratia (sensitivities ok). - Currently off abx - BAL cx. With serratia and enterococcus felt to be colonization - Watch cx. Low threshold to add back abx.  - WBC 18.2 ->15.2   5. Thrombocytopenia: Slow decrease in platelets, likely low level hemolysis + critical illness.  - resolved. Platelets 174k today  6. Anemia: Bleeding at trach site resolved but still with epistaxis - hgb 8.5  today - Transfuse hgb < 8  7. FEN - now with G-tube  8. Hypernatremia - NA 155-> 152 -> 151-> 152. Continue FW flushes. Lasix on hold  9. AKI - lasix on hold. Improved.   D/w with ECMO team (ECMO coordinator/specialists, CCM, TCTS and pharmD) at bedside on multidisciplinary rounds. Continue to await ARDS recovery. Details as above.   Length of Stay: 552 CRITICAL CARE Performed by: BGlori Bickers Total critical care time: 40 minutes  Critical care time was exclusive of separately billable procedures and treating other patients.  Critical care was necessary to treat or prevent imminent or life-threatening deterioration.  Critical care was time spent personally by me (independent of midlevel providers or residents) on the following activities: development of treatment plan with  patient and/or surrogate as well as nursing, discussions with consultants, evaluation of patient's response to treatment, examination of patient, obtaining history from patient or surrogate, ordering and performing treatments and interventions, ordering and review of laboratory studies, ordering and review of radiographic studies, pulse oximetry and re-evaluation of patient's condition.    DGlori BickersMD 09/12/2019, 8:03 AM  Advanced Heart Failure  Team Pager [phone] (M-F; [place])  Please contact [facility] for night-coverage after hours (4p -7a ) and weekends on [email or web address]

## 2019-09-12 NOTE — Progress Notes (Addendum)
Dr. Haroldine Laws and Dr. Prescott Gum made aware of UOP last 3 hours 18m total. Made aware of last BUN/Cr, CVP currently 17. Fluid balance positive for the day. Hgb 8.3, hct 28. 238mIVP lasix ordered.   RN reported lots of sediment in urine. Bladder scan performed reading [id number]. DanLinna Hoffde aware and ordered to replace foley catheter, send UA and culture, and ask pharmacy to dose ceftriaxone.   After foley replacement, pt BP dropped, requiring restarting levo gtt and RN gave PRN albumin. ES noted flashing on drainage cannula, rpms/fows dropped, pt still maintaining sats (98%), svO2 reading in the 80's, hgb 7.5 on [employer]help. CBC sent per protocol and verbal order received to transfuse 1 unit RBC from Dr. BenHaroldine Laws

## 2019-09-12 NOTE — Progress Notes (Signed)
ANTICOAGULATION CONSULT NOTE - Follow Up Consult  Pharmacy Consult for Heparin Indication: ECMO  Patient Measurements: Height: 5' 8"  (172.7 cm) Weight: (!) 328 lb 7.8 oz (149 kg) IBW/kg (Calculated) : 68.4 Heparin Dosing Weight: 104.5kg  Vital Signs: Temp: 97.5 F (36.4 C) (01/21 1600) Temp Source: Axillary (01/21 1600) Pulse Rate: 87 (01/21 1700)  Labs: Recent Labs    09/10/19 1607 09/10/19 1608 09/11/19 0458 09/11/19 0512 09/11/19 1739 09/11/19 1741 09/11/19 1954 09/11/19 2217 09/12/19 0408 09/12/19 0409 09/12/19 0434 09/12/19 1240 09/12/19 1240 09/12/19 1340 09/12/19 1422  HGB  --    < > 9.4*   < > 8.8*   < >  --    < > 8.3*  --    < > 7.8*   < > 8.5* 8.5*  HCT  --    < > 30.8*   < > 29.6*   < >  --    < > 28.0*  --    < > 23.0*  --  25.0* 25.0*  PLT  --    < > 174  --  153  --   --   --  158  --   --   --   --   --   --   APTT 106*  --  130*  --   --   --   --   --   --  125*  --   --   --   --   --   HEPARINUNFRC 0.34   < > 0.53  --   --   --  0.34  --   --  0.55  --   --   --   --   --   CREATININE  --    < > 1.60*  --  1.36*  --   --   --  1.34*  --   --   --   --   --   --    < > = values in this interval not displayed.    Assessment: 44yo male on VV ECMO on heparin infusion.   Heparin level 0.55 this am on upper end of goal on heparin drip rate 2400 uts/hr.   CBC stable, bleeding stable - still some nose bleeding but less. Fibrinogen elevated LDH stable 300-400  Goal of Therapy:  Heparin level 0.3-0.5 units/ml   Plan:  -decrease  heparin infusion 2300 units/h -Heparin levels q12h  Bonnita Nasuti Pharm.D. CPP, BCPS Clinical Pharmacist [phone] 09/12/2019 4:31 PM

## 2019-09-12 NOTE — Progress Notes (Signed)
NAME:  [name], MRN:  [id number], DOB:  1976/01/02, LOS: 39 ADMISSION DATE:  07/22/2019, CONSULTATION DATE:  12/24 REFERRING MD:  Sloan Leiter, CHIEF COMPLAINT:  Dyspnea   Brief History   44 y/o M admitted 12/14 with COVID pneumonia causing acute hypoxemic respiratory failure. Developed pneumomediastinum 12/23 with concern for possible bilateral small pneumothoraces on 12/24.  PCCM consulted for evaluation of pneumothoraces  Past Medical History  GERD HTN Asthma  Significant Hospital Events   12/14 admit with hypoxemic respiratory failure in setting of COVID-19 pneumonia 12/23 noted to have pneumomediastinum on chest x-ray 12/24 PCCM consulted for evaluation 12/26 worsening hypoxemia, intubated  12/27 VV fem/fem ECMO cannulation See bedside nursing event notes for full rundown of procedures after 12/27 1/30 improved cxr, improved lung compliance, TV 350s on 15PC  1/31 CXR increased infiltrates, +CFB     2/2 Converted to right IJ Crescent cannula.  Consults:  PCCM, CTS, CHF, Palliative  Procedures:  See bedside nursing event notes for full rundown of procedures.  Significant Diagnostic Tests:   CT chest 12/22 >> extensive pneumomediastinum, multifocal patchy bilateral groundglass opacities CXR 1/25 >> improving bilateral infiltrates. ECMO cannulae in place. CXR 2/2  Micro Data:  BCx2 12/14 >> negative  Sputum 12/15 >> negative  1/9 bronch: serratia and MSSA 1/2 acid fast smear bronch: negative 1/2 fungal cx bronch: pending 1/12 BAL: serratia 1/18 BAL >> rare Serratia. 2/1 BAL >> rare Serratia - few WBC  Antimicrobials:  Received remdesivir, steroids, actemra and convalescent plasma  5 days cefepime 12/24-12/29 Cefepime 1/4->1/14 vanc 1/4->1/14 Ceftriaxone 1/14 >>1/27 Cefazolin 1/28>> 02/2   Interim history/subjective:   Doing well, continuing to reduce sweep. Awake, very weak. TV in 100-200 range on 15/15  Objective   Blood pressure (!) 145/50, pulse 83,  temperature 98.4 F (36.9 C), temperature source Core, resp. rate (!) 33, height _0  (1.727 m), weight (!) 137.1 kg, SpO2 (!) 87 %. CVP:  [8 mmHg-23 mmHg] 23 mmHg  Vent Mode: PCV FiO2 (%):  [50 %-70 %] 70 % Set Rate:  [20 bmp] 20 bmp PEEP:  [15 cmH20-20 cmH20] 16 cmH20 Plateau Pressure:  [32 cmH20-37 cmH20] 32 cmH20   Intake/Output Summary (Last 24 hours) at 09/12/2019 1517 Last data filed at 09/12/2019 1400 Gross per 24 hour  Intake 3082.82 ml  Output 1975 ml  Net 1107.82 ml   Filed Weights   09/10/19 0348 09/11/19 0500 09/12/19 0500  Weight: (!) 150.8 kg (!) 148.5 kg (!) 137.1 kg   Examination GEN: ill obese man on vent HEENT: trach in place, still having oozing in mouth after PEG CV: distant, ext warm PULM: diminished at bases GI: Soft, +BS EXT: diffuse anasarca improved NEURO: opens eyes to yelling, occasional coughing on vent PSYCH: deferred SKIN: slightly pale  Pressure Injury 09/08/19 Cervical Medial Stage 3 -  Full thickness tissue loss. Subcutaneous fat may be visible but bone, tendon or muscle are NOT exposed. quarter size, snoozing (Active)  09/08/19 1600  Location: Cervical  Location Orientation: Medial  Staging: Stage 3 -  Full thickness tissue loss. Subcutaneous fat may be visible but bone, tendon or muscle are NOT exposed.  Wound Description (Comments): quarter size, snoozing  Present on Admission: No     Pressure Injury 09/12/19 Heel Left;Posterior;Lateral Deep Tissue Pressure Injury - Purple or maroon localized area of discolored intact skin or blood-filled blister due to damage of underlying soft tissue from pressure and/or shear. deep purple (Active)  09/12/19 0430  Location: Heel  Location Orientation: Left;Posterior;Lateral  Staging: Deep Tissue Pressure Injury - Purple or maroon localized area of discolored intact skin or blood-filled blister due to damage of underlying soft tissue from pressure and/or shear.  Wound Description (Comments): deep purple   Present on Admission: No     Pressure Injury 09/12/19 Foot Right;Posterior;Lateral Deep Tissue Pressure Injury - Purple or maroon localized area of discolored intact skin or blood-filled blister due to damage of underlying soft tissue from pressure and/or shear. deep purple (Active)  09/12/19 0430  Location: Foot  Location Orientation: Right;Posterior;Lateral  Staging: Deep Tissue Pressure Injury - Purple or maroon localized area of discolored intact skin or blood-filled blister due to damage of underlying soft tissue from pressure and/or shear.  Wound Description (Comments): deep purple  Present on Admission: No    CXR - BL infiltrates unchanged.. The patient's images have been independently reviewed by me.    CT head 1/28: pansinusitis. CT chest 1/28: Dense bilateral consolidation in dependent lungs both sides CT abdomen 1/28: Cannulae in position in IVC.  Flow (LPM): 5.13 ECMO Mode: VV ECMO Device: Cardiohelp  Vent Mode: PCV, Set Rate: 20 bmp, FiO2 (%): 70 % (by MD), I Time: 0.7 Sec(s), PEEP: (S) 16 cmH20 (by MD) Vt today 127m  Net IO Since Admission: 42,470.72 mL [09/12/19 1517]  Resolved Hospital Problem list   MSSA and serratia marcescens pneumonia   Assessment & Plan:   Acute hypoxemic respiratory failure with ARDS secondary to COVID-19, status post tracheostomy on mechanical ventilation and VV ECMO support.  Slowly resolving ARDS. Limited respiratory reserve with persistently high burden of disease, in keeping with natural history of COVID related ARDS.  Generally easier flow following cannula change. - Bronch today - Continue lung protective ventilation.  - Holding on diuresis today, defer to discussion with CHF team - Start TF when able through new PEG - Sweep wean  Posterior epistaxis appears to have resolved following a period of anticoagulation interruption. Current bleeding is oral and possibly subsequent to traumatic TEE insertion.  - Seems to have eased  up  Episode of AF. Currently on amiodarone. - transition to oral amiodarone once enteral access readily available.  Hypernatremia - FW 2065mvia tube.  Daily Goals Checklist   Pain/Anxiety/Delirium protocol (if indicated): bolus IV sedation only. Resume oral post PEG when cleared by Dr. AtOrvan SeenAP protocol (if indicated): Bundle in place. Respiratory support goals: Pressure control ventilation with ultra protective lung ventilation. Titrate FiO2 down again. Continue PEEP 20  Blood pressure target:  MAP>65, Continue NE. Hold amiodarone and milrinone for now. DVT prophylaxis: Remains on systemic heparin - target Heparin level 0.3-0.7 Nutrition Status: Nutrition Problem: Increased nutrient needs Etiology: acute illness(COVID PNA) Signs/Symptoms: estimated needs Interventions: Tube feeding, Prostat Aim for lower caloric goal. GI prophylaxis: Protonix per tube. Fluid status goals: hold furosemide today. Urinary catheter: Guide hemodynamic management Glucose control: Euglycemic control with SSI Mobility/therapy needs: Bedrest.  Daily labs: as per ECMO protocol. Code Status: Full code  Family Communication:family updated by RN. Disposition: ICU  Labs   CBC: CBC Latest Ref Rng & Units 09/12/2019 09/12/2019 09/12/2019  WBC 4.0 - 10.5 K/uL - - -  Hemoglobin 13.0 - 17.0 g/dL 8.5(L) 8.5(L) 7.8(L)  Hematocrit 39.0 - 52.0 % 25.0(L) 25.0(L) 23.0(L)  Platelets 150 - 400 K/uL - - -    Basic Metabolic Panel: BMP Latest Ref Rng & Units 09/12/2019 09/12/2019 09/12/2019  Glucose 70 - 99 mg/dL - - -  BUN 6 - 20 mg/dL - - -  Creatinine 0.61 - 1.24 mg/dL - - -  Sodium 135 - 145 mmol/L 155(H) 154(H) 153(H)  Potassium 3.5 - 5.1 mmol/L 4.2 4.3 4.0  Chloride 98 - 111 mmol/L - - -  CO2 22 - 32 mmol/L - - -  Calcium 8.9 - 10.3 mg/dL - - -    GFR: Estimated Creatinine Clearance: 94.4 mL/min (A) (by C-G formula based on SCr of 1.34 mg/dL (H)). Recent Labs  Lab 09/09/19 0304 09/09/19 1552 09/10/19 0407  09/10/19 0407 09/10/19 1835 09/11/19 0458 09/11/19 1739 09/12/19 0408  WBC 12.2*   < > 18.0*   < > 18.4* 18.2* 17.3* 15.2*  LATICACIDVEN 1.5  --  0.8  --   --  1.1  --  1.1   < > = values in this interval not displayed.    Liver Function Tests: No results for input(s): AST, ALT, ALKPHOS, BILITOT, PROT, ALBUMIN in the last 168 hours. No results for input(s): LIPASE, AMYLASE in the last 168 hours. No results for input(s): AMMONIA in the last 168 hours.  ABG    Component Value Date/Time   PHART 7.287 (L) 09/12/2019 1422   PCO2ART 65.9 (HH) 09/12/2019 1422   PO2ART 55.0 (L) 09/12/2019 1422   HCO3 31.6 (H) 09/12/2019 1422   TCO2 34 (H) 09/12/2019 1422   ACIDBASEDEF 2.0 08/28/2019 1519   O2SAT 84.0 09/12/2019 1422     Coagulation Profile: No results for input(s): INR, PROTIME in the last 168 hours.  Cardiac Enzymes: No results for input(s): CKTOTAL, CKMB, CKMBINDEX, TROPONINI in the last 168 hours.  HbA1C: Hgb A1c MFr Bld  Date/Time Value Ref Range Status  07/24/2019 04:50 AM 6.7 (H) 4.8 - 5.6 % Final    Comment:    (NOTE) Pre diabetes:          5.7%-6.4% Diabetes:              >6.4% Glycemic control for   <7.0% adults with diabetes   09/30/2017 05:36 AM 5.7 (H) 4.8 - 5.6 % Final    Comment:    (NOTE)         Prediabetes: 5.7 - 6.4         Diabetes: >6.4         Glycemic control for adults with diabetes: <7.0     CBG: Recent Labs  Lab 09/11/19 1958 09/11/19 2358 09/12/19 0432 09/12/19 0823 09/12/19 1132  GLUCAP 115* 153* 127* 116* 136*     The patient is critically ill with multiple organ systems failure and requires high complexity decision making for assessment and support, frequent evaluation and titration of therapies, application of advanced monitoring technologies and extensive interpretation of multiple databases. Critical Care Time devoted to patient care services described in this note independent of APP/resident time (if applicable)  is 31  minutes.   Erskine Emery MD [facility] 09/12/2019 3:23 PM Personal pager: [phone] If unanswered, please page CCM On-call: [phone]

## 2019-09-12 NOTE — Procedures (Signed)
Bronchoscopy  Indication:Mucus plugging  Consent: Signed in chart  Anesthesia: In place for ECMO  Procedure - Timeout performed - Bronchoscope advanced through tracheostomy - Findings as below  Findings - Left mainstem clot suctioned - Distal airways clear  Specimen(s): none  Complications: none immediate

## 2019-09-12 NOTE — Progress Notes (Signed)
ANTICOAGULATION CONSULT NOTE - Follow Up Consult  Pharmacy Consult for Heparin Indication: ECMO  Patient Measurements: Height: 5' 8"  (172.7 cm) Weight: (!) 328 lb 7.8 oz (149 kg) IBW/kg (Calculated) : 68.4 Heparin Dosing Weight: 104.5kg  Vital Signs: Temp: 97.5 F (36.4 C) (01/21 1600) Temp Source: Axillary (01/21 1600) Pulse Rate: 87 (01/21 1700)  Labs: Recent Labs    09/11/19 0458 09/11/19 0512 09/11/19 1739 09/11/19 1741 09/11/19 1954 09/11/19 2217 09/12/19 0408 09/12/19 0409 09/12/19 0434 09/12/19 1422 09/12/19 1422 09/12/19 1620 09/12/19 1633 09/12/19 1635  HGB 9.4*   < > 8.8*   < >  --    < > 8.3*  --    < > 8.5*   < > 8.2* 8.4*  --   HCT 30.8*   < > 29.6*   < >  --    < > 28.0*  --    < > 25.0*  --  24.0* 28.0*  --   PLT 174   < > 153  --   --   --  158  --   --   --   --   --  139*  --   APTT 130*  --   --   --   --   --   --  125*  --   --   --   --   --  162*  HEPARINUNFRC 0.53   < >  --   --  0.34  --   --  0.55  --   --   --   --   --  0.65  CREATININE 1.60*  --  1.36*  --   --   --  1.34*  --   --   --   --   --   --   --    < > = values in this interval not displayed.    Assessment: 44yo male on VV ECMO on heparin infusion.   Heparin level remains elevated at 0.65 despite rate adjustment earlier. TEG this afternoon with prolonged R-time. CBC stable though platelets slowly trending down. Fibrinogen this am remains high. Attempting to keep heparin level closer to 0.30.   Goal of Therapy:  Heparin level 0.3-0.5 units/ml   Plan:  -Reduce heparin to 2100 units/h -Will restart q6h heparin level checks for now   Arrie Senate, PharmD, BCPS Clinical Pharmacist [phone] Please check AMION for all [facility] numbers 09/12/2019

## 2019-09-12 NOTE — Significant Event (Signed)
Sweep Trial Performed   Sweep gas turned off from 14:16-14:19. Ventilator FiO2 100% during trial.  Pt sats dropped to 87, RR-33.  Pt's WOB was increased.  ABG drawn, resulted at 1422.  Pt Sweep returned to 7LPM briefly, then returned to the previous setting of 6LPM.  Pt tolerated sweep trial well.  Sats returned to 96 quickly after resuming sweep gas flow.  Will recheck ABG with evening labs at 5pm.  Dr Tamala Julian, Gweneth Fritter, ECMO Coordinator, Donnetta Simpers, bedside RN, and myself at the bedside.   Karsten Ro, RN

## 2019-09-12 NOTE — Progress Notes (Deleted)
IV pump interop not correlating all night per RN. Attempted to troubleshot myself without resolve. Pt has been off and on levo gtt throughout the night. Beginning of the shift, pt was dyssynchronous with the vent, belly breathing and required no levo. Gtt has ranged from 79mg-14mcg at the most. Current gtt rate is 123m.   Will report interop issues with oncoming shift.

## 2019-09-12 NOTE — Procedures (Signed)
Bedside Bronchoscopy Procedure Note KAIYU MIRABAL [id number] 01/31/1976  Procedure: Bronchoscopy, performed by CCM Dr Tamala Julian Indications: Diagnostic evaluation of the airways, remove blood clots  Procedure Details: ET Tube Size: ET Tube secured at lip (cm): Bite block in place: No pt is trached In preparation for procedure, Patient hyper-oxygenated with 100 % FiO2 Airway entered and the following bronchi were examined: Bronchi.   Bronchoscope removed.    Evaluation BP (!) 141/57   Pulse (!) 53   Temp 98.1 F (36.7 C) (Core)   Resp 16   Ht 5' 8"  (1.727 m)   Wt (!) 137.1 kg   SpO2 95%   BMI 45.96 kg/m  Breath Sounds:Diminished O2 sats: stable throughout Patient's Current Condition: stable Specimens:  None Complications: No apparent complications Patient did tolerate procedure well.   Lenna Sciara 09/12/2019, 8:10 AM

## 2019-09-13 ENCOUNTER — Inpatient Hospital Stay (HOSPITAL_COMMUNITY): Payer: Managed Care, Other (non HMO)

## 2019-09-13 LAB — COOXEMETRY PANEL
Carboxyhemoglobin: 3.1 % — ABNORMAL HIGH (ref 0.5–1.5)
Methemoglobin: 1.1 % (ref 0.0–1.5)
O2 Saturation: 82.3 %
Total hemoglobin: 8.9 g/dL — ABNORMAL LOW (ref 12.0–16.0)

## 2019-09-13 LAB — URINALYSIS, ROUTINE W REFLEX MICROSCOPIC
Bilirubin Urine: NEGATIVE
Glucose, UA: NEGATIVE mg/dL
Ketones, ur: NEGATIVE mg/dL
Leukocytes,Ua: NEGATIVE
Nitrite: NEGATIVE
Protein, ur: NEGATIVE mg/dL
Specific Gravity, Urine: 1.017 (ref 1.005–1.030)
pH: 5 (ref 5.0–8.0)

## 2019-09-13 LAB — POCT I-STAT 7, (LYTES, BLD GAS, ICA,H+H)
Acid-Base Excess: 4 mmol/L — ABNORMAL HIGH (ref 0.0–2.0)
Acid-Base Excess: 4 mmol/L — ABNORMAL HIGH (ref 0.0–2.0)
Acid-Base Excess: 3 mmol/L — ABNORMAL HIGH (ref 0.0–2.0)
Acid-Base Excess: 1 mmol/L (ref 0.0–2.0)
Bicarbonate: 29.9 mmol/L — ABNORMAL HIGH (ref 20.0–28.0)
Bicarbonate: 30.3 mmol/L — ABNORMAL HIGH (ref 20.0–28.0)
Bicarbonate: 29.5 mmol/L — ABNORMAL HIGH (ref 20.0–28.0)
Bicarbonate: 26 mmol/L (ref 20.0–28.0)
Calcium, Ion: 1.31 mmol/L (ref 1.15–1.40)
Calcium, Ion: 1.32 mmol/L (ref 1.15–1.40)
Calcium, Ion: 1.34 mmol/L (ref 1.15–1.40)
Calcium, Ion: 1.28 mmol/L (ref 1.15–1.40)
HCT: 25 % — ABNORMAL LOW (ref 39.0–52.0)
HCT: 26 % — ABNORMAL LOW (ref 39.0–52.0)
HCT: 28 % — ABNORMAL LOW (ref 39.0–52.0)
HCT: 24 % — ABNORMAL LOW (ref 39.0–52.0)
Hemoglobin: 8.5 g/dL — ABNORMAL LOW (ref 13.0–17.0)
Hemoglobin: 8.8 g/dL — ABNORMAL LOW (ref 13.0–17.0)
Hemoglobin: 9.5 g/dL — ABNORMAL LOW (ref 13.0–17.0)
Hemoglobin: 8.2 g/dL — ABNORMAL LOW (ref 13.0–17.0)
O2 Saturation: 98 %
O2 Saturation: 94 %
O2 Saturation: 86 %
O2 Saturation: 90 %
Patient temperature: 36.7
Patient temperature: 36.8
Patient temperature: 36.9
Patient temperature: 98.7
Potassium: 4.3 mmol/L (ref 3.5–5.1)
Potassium: 4.4 mmol/L (ref 3.5–5.1)
Potassium: 4.3 mmol/L (ref 3.5–5.1)
Potassium: 3.7 mmol/L (ref 3.5–5.1)
Sodium: 153 mmol/L — ABNORMAL HIGH (ref 135–145)
Sodium: 154 mmol/L — ABNORMAL HIGH (ref 135–145)
Sodium: 154 mmol/L — ABNORMAL HIGH (ref 135–145)
Sodium: 156 mmol/L — ABNORMAL HIGH (ref 135–145)
TCO2: 31 mmol/L (ref 22–32)
TCO2: 32 mmol/L (ref 22–32)
TCO2: 31 mmol/L (ref 22–32)
TCO2: 27 mmol/L (ref 22–32)
pCO2 arterial: 51.8 mmHg — ABNORMAL HIGH (ref 32.0–48.0)
pCO2 arterial: 56.7 mmHg — ABNORMAL HIGH (ref 32.0–48.0)
pCO2 arterial: 52.5 mmHg — ABNORMAL HIGH (ref 32.0–48.0)
pCO2 arterial: 44.7 mmHg (ref 32.0–48.0)
pH, Arterial: 7.368 (ref 7.350–7.450)
pH, Arterial: 7.335 — ABNORMAL LOW (ref 7.350–7.450)
pH, Arterial: 7.357 (ref 7.350–7.450)
pH, Arterial: 7.372 (ref 7.350–7.450)
pO2, Arterial: 118 mmHg — ABNORMAL HIGH (ref 83.0–108.0)
pO2, Arterial: 77 mmHg — ABNORMAL LOW (ref 83.0–108.0)
pO2, Arterial: 54 mmHg — ABNORMAL LOW (ref 83.0–108.0)
pO2, Arterial: 62 mmHg — ABNORMAL LOW (ref 83.0–108.0)

## 2019-09-13 LAB — BASIC METABOLIC PANEL
Anion gap: 11 (ref 5–15)
Anion gap: 11 (ref 5–15)
BUN: 103 mg/dL — ABNORMAL HIGH (ref 6–20)
BUN: 97 mg/dL — ABNORMAL HIGH (ref 6–20)
CO2: 28 mmol/L (ref 22–32)
CO2: 27 mmol/L (ref 22–32)
Calcium: 9.4 mg/dL (ref 8.9–10.3)
Calcium: 9.5 mg/dL (ref 8.9–10.3)
Chloride: 114 mmol/L — ABNORMAL HIGH (ref 98–111)
Chloride: 114 mmol/L — ABNORMAL HIGH (ref 98–111)
Creatinine, Ser: 1.38 mg/dL — ABNORMAL HIGH (ref 0.61–1.24)
Creatinine, Ser: 1.4 mg/dL — ABNORMAL HIGH (ref 0.61–1.24)
GFR calc Af Amer: >60 mL/min (ref >60)
GFR calc Af Amer: >60 mL/min (ref >60)
GFR calc non Af Amer: >60 mL/min (ref >60)
GFR calc non Af Amer: >60 mL/min (ref >60)
Glucose, Bld: 132 mg/dL — ABNORMAL HIGH (ref 70–99)
Glucose, Bld: 148 mg/dL — ABNORMAL HIGH (ref 70–99)
Potassium: 4.4 mmol/L (ref 3.5–5.1)
Potassium: 4.3 mmol/L (ref 3.5–5.1)
Sodium: 153 mmol/L — ABNORMAL HIGH (ref 135–145)
Sodium: 152 mmol/L — ABNORMAL HIGH (ref 135–145)

## 2019-09-13 LAB — URINE CULTURE: Culture: NO GROWTH

## 2019-09-13 LAB — CBC
HCT: 27.3 % — ABNORMAL LOW (ref 39.0–52.0)
HCT: 28.1 % — ABNORMAL LOW (ref 39.0–52.0)
HCT: 30.4 % — ABNORMAL LOW (ref 39.0–52.0)
Hemoglobin: 7.7 g/dL — ABNORMAL LOW (ref 13.0–17.0)
Hemoglobin: 8.4 g/dL — ABNORMAL LOW (ref 13.0–17.0)
Hemoglobin: 8.9 g/dL — ABNORMAL LOW (ref 13.0–17.0)
MCH: 27.4 pg (ref 26.0–34.0)
MCH: 28.3 pg (ref 26.0–34.0)
MCH: 28.7 pg (ref 26.0–34.0)
MCHC: 28.2 g/dL — ABNORMAL LOW (ref 30.0–36.0)
MCHC: 29.3 g/dL — ABNORMAL LOW (ref 30.0–36.0)
MCHC: 29.9 g/dL — ABNORMAL LOW (ref 30.0–36.0)
MCV: 95.9 fL (ref 80.0–100.0)
MCV: 96.5 fL (ref 80.0–100.0)
MCV: 97.2 fL (ref 80.0–100.0)
Platelets: 123 10*3/uL — ABNORMAL LOW (ref 150–400)
Platelets: 124 10*3/uL — ABNORMAL LOW (ref 150–400)
Platelets: 144 10*3/uL — ABNORMAL LOW (ref 150–400)
RBC: 2.81 MIL/uL — ABNORMAL LOW (ref 4.22–5.81)
RBC: 2.93 MIL/uL — ABNORMAL LOW (ref 4.22–5.81)
RBC: 3.15 MIL/uL — ABNORMAL LOW (ref 4.22–5.81)
RDW: 19.1 % — ABNORMAL HIGH (ref 11.5–15.5)
RDW: 19.1 % — ABNORMAL HIGH (ref 11.5–15.5)
RDW: 19.6 % — ABNORMAL HIGH (ref 11.5–15.5)
WBC: 10.8 10*3/uL — ABNORMAL HIGH (ref 4.0–10.5)
WBC: 11.2 10*3/uL — ABNORMAL HIGH (ref 4.0–10.5)
WBC: 9.8 10*3/uL (ref 4.0–10.5)
nRBC: 0.4 % — ABNORMAL HIGH (ref 0.0–0.2)
nRBC: 0.9 % — ABNORMAL HIGH (ref 0.0–0.2)
nRBC: 1.2 % — ABNORMAL HIGH (ref 0.0–0.2)

## 2019-09-13 LAB — GLUCOSE, CAPILLARY
Glucose-Capillary: 121 mg/dL — ABNORMAL HIGH (ref 70–99)
Glucose-Capillary: 102 mg/dL — ABNORMAL HIGH (ref 70–99)
Glucose-Capillary: 118 mg/dL — ABNORMAL HIGH (ref 70–99)
Glucose-Capillary: 143 mg/dL — ABNORMAL HIGH (ref 70–99)
Glucose-Capillary: 92 mg/dL (ref 70–99)

## 2019-09-13 LAB — FIBRINOGEN: Fibrinogen: 800 mg/dL — ABNORMAL HIGH (ref 210–475)

## 2019-09-13 LAB — LACTATE DEHYDROGENASE: LDH: 320 U/L — ABNORMAL HIGH (ref 98–192)

## 2019-09-13 LAB — APTT
aPTT: 114 seconds — ABNORMAL HIGH (ref 24–36)
aPTT: 89 seconds — ABNORMAL HIGH (ref 24–36)

## 2019-09-13 LAB — PREPARE RBC (CROSSMATCH)

## 2019-09-13 LAB — LACTIC ACID, PLASMA: Lactic Acid, Venous: 1 mmol/L (ref 0.5–1.9)

## 2019-09-13 LAB — HEPARIN LEVEL (UNFRACTIONATED)
Heparin Unfractionated: 0.4 IU/mL (ref 0.30–0.70)
Heparin Unfractionated: 0.37 IU/mL (ref 0.30–0.70)

## 2019-09-13 MED ORDER — PIVOT 1.5 CAL PO LIQD
1000.0000 mL | ORAL | Status: DC
  Administered 2019-09-13: 1000 mL
  Filled 2019-09-13: qty 1000

## 2019-09-13 MED ORDER — SODIUM CHLORIDE 0.9% IV SOLUTION: Freq: Once | INTRAVENOUS | Status: AC

## 2019-09-13 MED ORDER — PRO-STAT SUGAR FREE PO LIQD
60.0000 mL | Freq: Every day | ORAL | Status: DC
  Administered 2019-09-13 – 2019-09-17 (×21): 60 mL via ORAL
  Filled 2019-09-13 (×21): qty 60

## 2019-09-13 NOTE — CV Procedure (Signed)
Date of service 2/4  ECMO NOTE:  Indication: Respiratory failure due to COVID PNA  Initial cannulation date: 08/03/2019  ECMO type: VV ECMO (Cardio-Help)  Dual lumen  Inflow/return cannula:  1) 32 FR Crescent placed 2/2  ECMO events:  Initial cannulation 12/26 Circuit changed and trach exchanged on 1/8.   Underwent addition of a RIJ drainage cannula on 1/20 Circuit changed on 1/22 for rising LDH Had evidence of recirculation and RIJ cannula pulled back on 1/26 19 FR RIJ removed 09/06/19 Underwent change to [facility] catheter with removal of femoral lines 2/2  Daily data:  Remains on VV ecmo with good flows.   Flow 5.13 RPM 3650 dP 31 Pven  -105 Sweep 9  SVO2 67.8%  Labs:  Hgb 8.5 LDH 450 -> 481 -> 508 -> 557 -> 555 -> 469 -> 367 Heparin level: 0.55  Plan:  Crescent cannula working well Excellent flow and sats CXR improving Sedation being weaned. Hold lasix with AKI Follow cultures Bronch today Continue to wean sweep gas and NE as tolerated   Glori Bickers, MD  8:34 AM

## 2019-09-13 NOTE — Progress Notes (Signed)
NAME:  [name], MRN:  [id number], DOB:  1976/05/09, LOS: 58 ADMISSION DATE:  07/22/2019, CONSULTATION DATE:  12/24 REFERRING MD:  Sloan Leiter, CHIEF COMPLAINT:  Dyspnea   Brief History   44 y/o M admitted 12/14 with COVID pneumonia causing acute hypoxemic respiratory failure. Developed pneumomediastinum 12/23 with concern for possible bilateral small pneumothoraces on 12/24.  PCCM consulted for evaluation of pneumothoraces  Past Medical History  GERD HTN Asthma  Significant Hospital Events   12/14 admit with hypoxemic respiratory failure in setting of COVID-19 pneumonia 12/23 noted to have pneumomediastinum on chest x-ray 12/24 PCCM consulted for evaluation 12/26 worsening hypoxemia, intubated  12/27 VV fem/fem ECMO cannulation See bedside nursing event notes for full rundown of procedures after 12/27 1/30 improved cxr, improved lung compliance, TV 350s on 15PC  1/31 CXR increased infiltrates, +CFB     2/2 Converted to right IJ Crescent cannula. 2/3 PEG tube inserted at bedside  Consults:  PCCM, CTS, CHF, Palliative   Significant Diagnostic Tests:   CT chest 12/22 >> extensive pneumomediastinum, multifocal patchy bilateral groundglass opacities CXR 1/25 >> improving bilateral infiltrates. ECMO cannulae in place. CXR 2/5 >> clearing bilateral infiltrates  Micro Data:  BCx2 12/14 >> negative  Sputum 12/15 >> negative  1/9 bronch: serratia and MSSA 1/2 acid fast smear bronch: negative 1/2 fungal cx bronch: pending 1/12 BAL: serratia 1/18 BAL >> rare Serratia. 2/1 BAL >> rare Serratia - few WBC  Antimicrobials:  Received remdesivir, steroids, actemra and convalescent plasma  5 days cefepime 12/24-12/29 Cefepime 1/4->1/14 vanc 1/4->1/14 Ceftriaxone 1/14 >>1/27 Cefazolin 1/28>> 02/2   Interim history/subjective:   Tolerated sweep reduction. Large clot removed from mouth last night.   Objective   Blood pressure (!) 95/45, pulse 67, temperature 98.1 F (36.7 C),  temperature source Core, resp. rate 10, height _0  (1.727 m), weight (!) 141.3 kg, SpO2 100 %. CVP:  [11 mmHg-23 mmHg] 15 mmHg  Vent Mode: PCV FiO2 (%):  [50 %-70 %] 70 % Set Rate:  [20 bmp] 20 bmp PEEP:  [15 cmH20-16 cmH20] 16 cmH20 Pressure Support:  [16 cmH20] 16 cmH20   Intake/Output Summary (Last 24 hours) at 09/13/2019 0751 Last data filed at 09/13/2019 8546 Gross per 24 hour  Intake 3202.34 ml  Output 2290 ml  Net 912.34 ml   Filed Weights   09/11/19 0500 09/12/19 0500 09/13/19 0500  Weight: (!) 148.5 kg (!) 137.1 kg (!) 141.3 kg   Examination GEN: ill obese man on vent HEENT: trach in place, no obvious oral or nasal bleeding. CV: distant, ext warm PULM: improved vesicular breath sounds anteriorly, no wheezing. Bronchial breath sounds have receded to mid-axillary line. Breath stacking, improved with increased Ti. Unable to draw adequate Vt on PSV. Too dyssynchronous for PRVC. GI: Soft, +BS EXT: diffuse anasarca improved NEURO: opens eyes to yelling, occasional coughing on vent PSYCH: deferred SKIN: slightly pale  Pressure Injury 09/08/19 Cervical Medial Stage 3 -  Full thickness tissue loss. Subcutaneous fat may be visible but bone, tendon or muscle are NOT exposed. quarter size, snoozing (Active)  09/08/19 1600  Location: Cervical  Location Orientation: Medial  Staging: Stage 3 -  Full thickness tissue loss. Subcutaneous fat may be visible but bone, tendon or muscle are NOT exposed.  Wound Description (Comments): quarter size, snoozing  Present on Admission: No     Pressure Injury 09/12/19 Heel Left;Posterior;Lateral Deep Tissue Pressure Injury - Purple or maroon localized area of discolored intact skin or blood-filled blister due to  damage of underlying soft tissue from pressure and/or shear. deep purple (Active)  09/12/19 0430  Location: Heel  Location Orientation: Left;Posterior;Lateral  Staging: Deep Tissue Pressure Injury - Purple or maroon localized area of  discolored intact skin or blood-filled blister due to damage of underlying soft tissue from pressure and/or shear.  Wound Description (Comments): deep purple  Present on Admission: No     Pressure Injury 09/12/19 Foot Right;Posterior;Lateral Deep Tissue Pressure Injury - Purple or maroon localized area of discolored intact skin or blood-filled blister due to damage of underlying soft tissue from pressure and/or shear. deep purple (Active)  09/12/19 0430  Location: Foot  Location Orientation: Right;Posterior;Lateral  Staging: Deep Tissue Pressure Injury - Purple or maroon localized area of discolored intact skin or blood-filled blister due to damage of underlying soft tissue from pressure and/or shear.  Wound Description (Comments): deep purple  Present on Admission: No    CT head 1/28: pansinusitis. CT chest 1/28: Dense bilateral consolidation in dependent lungs both sides CT abdomen 1/28: Cannulae in position in IVC.  Flow (LPM): 4.82 ECMO Mode: VV ECMO Device: Cardiohelp  Vent Mode: PCV, Set Rate: 20 bmp, FiO2 (%): 70 %, I Time: 0.7 Sec(s), PEEP: 16 cmH20 Vt today 359m  Net IO Since Admission: 42,944.13 mL [09/13/19 0751]  Resolved Hospital Problem list   MSSA and serratia marcescens pneumonia   Assessment & Plan:   Acute hypoxemic respiratory failure with ARDS secondary to COVID-19, status post tracheostomy on mechanical ventilation and VV ECMO support.  Slowly resolving ARDS. Limited respiratory reserve with persistently high burden of disease, in keeping with natural history of COVID related ARDS.  Generally easier flow following cannula change. - No Bronch today - Continue lung protective ventilation.  - Diurese today - Wean off iv sedation - Sweep trial again today.  Posterior epistaxis appears to have resolved following a period of anticoagulation interruption. Current bleeding is oral and possibly subsequent to traumatic TEE insertion.  - Continue to  monitor  Episode of AF. Currently on amiodarone. - transition to oral amiodarone once enteral access readily available.  Hypernatremia - FW 2075mvia tube.  Daily Goals Checklist   Pain/Anxiety/Delirium protocol (if indicated): wean infusions off. Continue enteral agents. VAP protocol (if indicated): Bundle in place. Respiratory support goals: Pressure control ventilation with ultra protective lung ventilation. Titrate FiO2 down again. Continue PEEP 20  Blood pressure target:  MAP>65, Continue NE. Continue iv amiodarone until off iv sedation and then consider transition to orals. DVT prophylaxis: Remains on systemic heparin - target Heparin level 0.3-0.7 Nutrition Status: Nutrition Problem: Increased nutrient needs Etiology: acute illness(COVID PNA) Signs/Symptoms: estimated needs Interventions: Tube feeding, Prostat Aim for lower caloric goal. Resume trickle feeds today. GI prophylaxis: Protonix per tube. Fluid status goals: Furosemide 20 today. Urinary catheter: Guide hemodynamic management Glucose control: Euglycemic control with SSI Mobility/therapy needs: Bedrest.  Daily labs: as per ECMO protocol. Code Status: Full code  Family Communication:family updated by RN. Disposition: ICU  Labs   CBC: CBC Latest Ref Rng & Units 09/13/2019 09/13/2019 09/13/2019  WBC 4.0 - 10.5 K/uL 10.8(H) - 9.8  Hemoglobin 13.0 - 17.0 g/dL 8.4(L) 8.5(L) 7.7(L)  Hematocrit 39.0 - 52.0 % 28.1(L) 25.0(L) 27.3(L)  Platelets 150 - 400 K/uL 123(L) - 124(L)    Basic Metabolic Panel: BMP Latest Ref Rng & Units 09/13/2019 09/13/2019 09/12/2019  Glucose 70 - 99 mg/dL 132(H) - -  BUN 6 - 20 mg/dL 103(H) - -  Creatinine 0.61 - 1.24 mg/dL 1.38(H) - -  Sodium 135 - 145 mmol/L 153(H) 153(H) 153(H)  Potassium 3.5 - 5.1 mmol/L 4.4 4.3 4.5  Chloride 98 - 111 mmol/L 114(H) - -  CO2 22 - 32 mmol/L 28 - -  Calcium 8.9 - 10.3 mg/dL 9.4 - -    GFR: Estimated Creatinine Clearance: 93.3 mL/min (A) (by C-G formula based  on SCr of 1.38 mg/dL (H)). Recent Labs  Lab 09/10/19 0407 09/10/19 1835 09/11/19 0458 09/11/19 1739 09/12/19 0408 09/12/19 1633 09/13/19 0021 09/13/19 0325  WBC 18.0*   < > 18.2*   < > 15.2* 11.7* 9.8 10.8*  LATICACIDVEN 0.8  --  1.1  --  1.1  --   --  1.0   < > = values in this interval not displayed.    Liver Function Tests: No results for input(s): AST, ALT, ALKPHOS, BILITOT, PROT, ALBUMIN in the last 168 hours. No results for input(s): LIPASE, AMYLASE in the last 168 hours. No results for input(s): AMMONIA in the last 168 hours.  ABG    Component Value Date/Time   PHART 7.368 09/13/2019 0321   PCO2ART 51.8 (H) 09/13/2019 0321   PO2ART 118.0 (H) 09/13/2019 0321   HCO3 29.9 (H) 09/13/2019 0321   TCO2 31 09/13/2019 0321   ACIDBASEDEF 2.0 08/28/2019 1519   O2SAT 82.3 09/13/2019 0325     Coagulation Profile: No results for input(s): INR, PROTIME in the last 168 hours.  Cardiac Enzymes: No results for input(s): CKTOTAL, CKMB, CKMBINDEX, TROPONINI in the last 168 hours.  HbA1C: Hgb A1c MFr Bld  Date/Time Value Ref Range Status  07/24/2019 04:50 AM 6.7 (H) 4.8 - 5.6 % Final    Comment:    (NOTE) Pre diabetes:          5.7%-6.4% Diabetes:              >6.4% Glycemic control for   <7.0% adults with diabetes   09/30/2017 05:36 AM 5.7 (H) 4.8 - 5.6 % Final    Comment:    (NOTE)         Prediabetes: 5.7 - 6.4         Diabetes: >6.4         Glycemic control for adults with diabetes: <7.0     CBG: Recent Labs  Lab 09/12/19 1620 09/12/19 2005 09/12/19 2329 09/13/19 0319 09/13/19 0729  GLUCAP 114* 101* 142* 121* 102*     CRITICAL CARE Performed by: Kipp Brood   Total critical care time: 60 minutes  Critical care time was exclusive of separately billable procedures and treating other patients.  Critical care was necessary to treat or prevent imminent or life-threatening deterioration.  Critical care was time spent personally by me on the following  activities: development of treatment plan with patient and/or surrogate as well as nursing, discussions with consultants, evaluation of patient's response to treatment, examination of patient, obtaining history from patient or surrogate, ordering and performing treatments and interventions, ordering and review of laboratory studies, ordering and review of radiographic studies, pulse oximetry, re-evaluation of patient's condition and participation in multidisciplinary rounds.  Kipp Brood, MD [facility] Phys[facility] [place]  Pager: [phone] Mobile: [phone] After hours: [phone].

## 2019-09-13 NOTE — Progress Notes (Signed)
Around 0400 this RN and the ECMO Specialist got the pt ready for a bath. I began to do mouth care first and noticed a dark shadow in the back of the pt's throat. I tried to to suction with the yankauer and there were small clot bits that came out. Upon investigating further with a flashlight I noticed the clot was larger than I expected. We used the yankauer to push the clot to the side of the pt's mouth and I used my fingers to pull it out.   The clot measured 36cm in length.   I noticed that there was more clot in the back of the pt's throat after I pulled it out. I could not get it with the yankaueur or my fingers. We had Elink camera in. Dr. Oletta Darter looked at the clot and we all agreed to have the ground team come look and safely retrieve the rest of the clot.   Pt's VSS throughout the entire ordeal. Pt is also tolerating laying flat. Turning, mouth care, trach care, or any movement has been deferred until the clot is retrieved.   Awaiting ground team's arrival.

## 2019-09-13 NOTE — Progress Notes (Signed)
Orthopedic Tech Progress Note Patient Details:  [name] 1976-07-18 [id number] RN called requesting a fresh pair of UNNA BOOTS for this patient. Ortho Devices Type of Ortho Device: Haematologist Ortho Device/Splint Location: bilateral legs Ortho Device/Splint Interventions: Application, Ordered   Post Interventions Patient Tolerated: Well Instructions Provided: Care of device, Adjustment of device   Janit Pagan 09/13/2019, 11:41 AM

## 2019-09-13 NOTE — Progress Notes (Signed)
ANTICOAGULATION CONSULT NOTE - Follow Up Consult  Pharmacy Consult for heparin Indication: ECMO  Labs: Recent Labs    09/12/19 0408 09/12/19 1635 09/12/19 1640 09/12/19 2007 09/12/19 2303 09/12/19 2331 09/13/19 0021 09/13/19 0321 09/13/19 0325 09/13/19 0325 09/13/19 1144 09/13/19 1144 09/13/19 1631 09/13/19 1640  HGB  --   --   --    < >  --    < > 7.7*   < > 8.4*   < > 8.8*   < > 8.9* 9.5*  HCT  --   --   --    < >  --    < > 27.3*   < > 28.1*   < > 26.0*  --  30.4* 28.0*  PLT  --   --   --    < >  --   --  124*  --  123*  --   --   --  144*  --   APTT  --  162*  --   --   --   --   --   --  114*  --   --   --  89*  --   HEPARINUNFRC  --  0.65  --    < > 0.57  --   --   --  0.40  --   --   --  0.37  --   CREATININE   < >  --  1.33*  --   --   --   --   --  1.38*  --   --   --  1.40*  --    < > = values in this interval not displayed.    Assessment: 44yo male on ECMO heparin drip 1800 uts/hr HL 0.37 at therapeutic goal  No over bleeding - still come bleeding from nose and clot in back of throat  - improved  Nasal packing removed 1/29  Goal of Therapy:  Heparin level 0.3-0.5 units/ml (aiming for lowest end)   Plan:  Continue heparin 1800 uts/hr  Check heparin  q12h  Monitor s/s bleeding  Bonnita Nasuti Pharm.D. CPP, BCPS Clinical Pharmacist [phone] 09/13/2019 5:17 PM

## 2019-09-13 NOTE — Progress Notes (Addendum)
Nutrition Follow up  DOCUMENTATION CODES:   Obesity unspecified  INTERVENTION:   Zinc resulted as 126 (H). Recommend d/c as pt has been receiving >1 month. Vitamin C lab pending.   MD desires trickles -Pivot 1.5 @ 20 ml/hr via PEG -60 ml Prostat five times daily  -Free water flushes 300 ml Q8 hours- per CCM   TF provides: 1720 kcals, 195 grams protein, 365 ml free water (1265 ml with flushes). Meets 56% kcal needs and 100% of protein needs.   Recommend advancing (Pivot 1.5) 10 ml until goal rate of 65 ml/hr + 60 ml Prostat TID. Provides 2940 kcal, 236 g protein, and 1186 ml free water.   NUTRITION DIAGNOSIS:   Increased nutrient needs related to acute illness(COVID PNA) as evidenced by estimated needs.  Ongoing  GOAL:   Patient will meet greater than or equal to 90% of their needs   Not meeting kcal- MD desires trickles  MONITOR:   Diet advancement, Skin, TF tolerance, Weight trends, Labs, I & O's  REASON FOR ASSESSMENT:   Consult Enteral/tube feeding initiation and management  ASSESSMENT:   Patient with PMH significant for GERD, HTN, and asthma. Presents this admission with COVID-19 PNA.   12/14- admit 12/23- chest xray revealed pneumomediastinum  12/24- developed bilateral pneumothoraces  12/26- worsening hypoxemia, intubated  12/27- s/p VV fem/fem ECMO cannulation  12/29- s/p trach  1/5- nasal bleeding- packing per CCM 1/7- nasal packing per ENT 1/8- trach exchanged, ECMO circuit changed  1/20- s/p additional RIJ drainage cannula  1/21- Cortrak kinked, replaced by OGT, pt vomited over night 1/22- ECMO circuit exchanged  1/23- nasal endoscopy, control of epistaxis  2/2- TEE, Cortrak removed, change to crescent catheter, removal of femoral lines 2/3- s/p PEG, EGD  VV ECMO day 41. Weaning sedation. Tolerated sweep reduction yesterday- trying again today.   Tube feeding turned off since 2/2. BUN/Cr trending up. Remains hypernatremic indicating pt remains  in free water deficit. Total Estimated Free water deficit of 6.7 L based on EDW 121 kg. Free water flushes per MD at present. Pt continues to diurese.   Pt shows to have new DTIs on bilateral heels. Plan to add Prostat to meet protein needs.     No rectal tube documentation. RD observed 100 ml in bag at bedside.   Admission weight: 136.1 kg  Current weight: 141.3 kg (down 9 kg in last 5 days)   Patient remains on ventilator support via trach  MV: 7.8 L/min Temp (24hrs), Avg:98.3 F (36.8 C), Min:98.1 F (36.7 C), Max:98.4 F (36.9 C)   I/O: +8,936 ml since 1/22 UOP:2,235 ml x 24 hrs  Chest tubes: 55 ml x 24 hrs   Drips: albumin, precedex, versed Medications: 500 mg Vit C daily, SS novolog, 220 mg zinc sulfate daily  Labs: Na 153 (H) Cr 1.38 BUN 103 Zinc 126 (H)   Diet Order:   Diet Order    None      EDUCATION NEEDS:   Not appropriate for education at this time  Skin:  Skin Assessment: Skin Integrity Issues: Skin Integrity Issues:: Stage III, Incisions, DTI DTI: bilateral heels Stage II: n/a Stage III: neck from trach ties Incisions: L wrist, nose Other: n/a  Last BM:  2/5  Height:   Ht Readings from Last 1 Encounters:  08/09/19 5' 8"  (1.727 m)    Weight:   Wt Readings from Last 1 Encounters:  09/13/19 (!) 141.3 kg    Ideal Body Weight:  70 kg  BMI:  Body mass index is 47.36 kg/m.  Estimated Nutritional Needs:   Kcal:  2902-1115 kcal (25-30 kcal/kg)  Protein:  180-215 grams  Fluid:  >/= 2.5 L/day   [name] RD, LDN Clinical Nutrition Pager # - [phone]

## 2019-09-13 NOTE — CV Procedure (Signed)
   ECMO NOTE:  Indication: Respiratory failure due to COVID PNA  Initial cannulation date: 08/03/2019  ECMO type: VV ECMO (Cardio-Help)  Dual lumen  Inflow/return cannula:  1) 32 FR Crescent placed 2/2  ECMO events:  Initial cannulation 12/26 Circuit changed and trach exchanged on 1/8.   Underwent addition of a RIJ drainage cannula on 1/20 Circuit changed on 1/22 for rising LDH Had evidence of recirculation and RIJ cannula pulled back on 1/26 19 FR RIJ removed 09/06/19 Underwent change to [facility] catheter with removal of femoral lines 2/2  Daily data:  Remains on VV ecmo with good flows.   Flow 4.78 RPM 3450 dP 28 Pven  -81 Sweep 7  SVO2 82%  Labs:  Hgb 8.4 LDH 450 -> 481 -> 508 -> 557 -> 555 -> 469 -> 367 -> 320  Heparin level: 0.4  Plan:  Crescent cannula working well Excellent flow and sats CXR improving Wean sedation Hold lasix with AKI Sweep trial today   Glori Bickers, MD  8:34 AM

## 2019-09-13 NOTE — Progress Notes (Signed)
Patient ID: [name], male   DOB: Aug 17, 1975, 44 y.o.   MRN: [id number]    Advanced Heart Failure Rounding Note   Subjective:    Circuit changed and trach exchanged on 1/8.   Underwent addition of a RIJ drainage cannula on 1/20 Circuit changed on 1/22 for rising LDH Had evidence of recirculation and RIJ cannula pulled back on 1/26 Underwent change to [facility] catheter with removal of femoral lines on 2/2  Remains on VV ecmo with good flows.   Sats improved mid to high 90s. Sweep down to 7. Had some recirculation last night. Improved now. Foley clotted and replaced. Ceftriaxone started  ABG 7.38/52/118/98%  Co-ox 82%   On vent now at 70% PEEP 15. PlatP 30, Bi-vent setting.TVs ~180-350cc Personally reviewed  CXR improved. Crescent catheter well positioned   hgb 8.4 wbc 18.2 -> 15.2 -> 10.8. Sodium 153  ECMO parameters - see separate ecmo note   Objective:   Weight Range:  Vital Signs:   Temp:  [98.1 F (36.7 C)-98.4 F (36.9 C)] 98.2 F (36.8 C) (02/05 0800) Pulse Rate:  [48-96] 76 (02/05 0900) Resp:  [9-33] 10 (02/05 0900) BP: (95-150)/(45-60) 95/45 (02/05 0113) SpO2:  [82 %-100 %] 100 % (02/05 0900) Arterial Line BP: (94-155)/(39-62) 152/55 (02/05 0900) FiO2 (%):  [40 %-70 %] 60 % (02/05 0821) Weight:  [141.3 kg] 141.3 kg (02/05 0500) Last BM Date: 09/13/19  Weight change: Filed Weights   09/11/19 0500 09/12/19 0500 09/13/19 0500  Weight: (!) 148.5 kg (!) 137.1 kg (!) 141.3 kg    Intake/Output:   Intake/Output Summary (Last 24 hours) at 09/13/2019 0919 Last data filed at 09/13/2019 0900 Gross per 24 hour  Intake 3356.6 ml  Output 2315 ml  Net 1041.6 ml     Physical Exam: General:  Sedated on vent HEENT: normal Neck: supple. + trach RIJ Crescent cannula Carotids 2+ bilat; no bruits. No lymphadenopathy or thryomegaly appreciated. Cor: PMI nondisplaced. Regular rate & rhythm. No rubs, gallops or murmurs. Lungs: reduced breath sounds. coarse Abdomen:  obese soft, nontender, nondistended. No hepatosplenomegaly. No bruits or masses. Good bowel sounds. Extremities: no cyanosis, clubbing, rash, 1-2+ edema groin sites ok  Neuro: sedated on vent    Telemetry: Sinus 70-80s  Personally reviewed  Labs: Basic Metabolic Panel: Recent Labs  Lab 09/07/19 0433 09/07/19 0443 09/10/19 1600 09/11/19 0228 09/11/19 0458 09/11/19 0512 09/11/19 1739 09/11/19 1741 09/12/19 0408 09/12/19 0434 09/12/19 1640 09/12/19 2007 09/12/19 2331 09/13/19 0321 09/13/19 0325  NA 151*   < >   < >  --  151*   < > 152*   < > 152*   < > 153* 154* 153* 153* 153*  K 4.2   < >   < >  --  4.1   < > 3.9   < > 3.9   < > 4.1 4.2 4.5 4.3 4.4  CL 107   < >   < >  --  109  --  108  --  111  --  113*  --   --   --  114*  CO2 30   < >   < >  --  28  --  27  --  29  --  27  --   --   --  28  GLUCOSE 163*   < >   < >  --  134*  --  124*  --  133*  --  116*  --   --   --  132*  BUN 81*   < >   < >  --  111*  --  103*  --  98*  --  96*  --   --   --  103*  CREATININE 0.99   < >   < >  --  1.60*  --  1.36*  --  1.34*  --  1.33*  --   --   --  1.38*  CALCIUM 9.4   < >   < >  --  9.5   < > 9.6   < > 9.6  --  9.3  --   --   --  9.4  MG 2.8*  --   --  2.5*  --   --   --   --   --   --   --   --   --   --   --    < > = values in this interval not displayed.    Liver Function Tests: No results for input(s): AST, ALT, ALKPHOS, BILITOT, PROT, ALBUMIN in the last 168 hours. No results for input(s): LIPASE, AMYLASE in the last 168 hours. No results for input(s): AMMONIA in the last 168 hours.  CBC: Recent Labs  Lab 09/11/19 1739 09/11/19 1741 09/12/19 0408 09/12/19 0434 09/12/19 1633 09/12/19 1633 09/12/19 2007 09/12/19 2331 09/13/19 0021 09/13/19 0321 09/13/19 0325  WBC 17.3*  --  15.2*  --  11.7*  --   --   --  9.8  --  10.8*  HGB 8.8*   < > 8.3*   < > 8.4*   < > 8.2* 7.8* 7.7* 8.5* 8.4*  HCT 29.6*   < > 28.0*   < > 28.0*   < > 24.0* 23.0* 27.3* 25.0* 28.1*  MCV 94.3   --  94.3  --  96.2  --   --   --  97.2  --  95.9  PLT 153  --  158  --  139*  --   --   --  124*  --  123*   < > = values in this interval not displayed.    Cardiac Enzymes: No results for input(s): CKTOTAL, CKMB, CKMBINDEX, TROPONINI in the last 168 hours.  BNP: BNP (last 3 results) Recent Labs    07/22/19 1039  BNP 15.3    ProBNP (last 3 results) No results for input(s): PROBNP in the last 8760 hours.    Other results:  Imaging: DG CHEST PORT 1 VIEW  Result Date: 09/13/2019 CLINICAL DATA:  Tracheostomy tube. EXAM: PORTABLE CHEST 1 VIEW COMPARISON:  September 12, 2019. FINDINGS: Stable cardiomediastinal silhouette. Tracheostomy tube is unchanged in position. Stable bilateral chest tubes are noted without pneumothorax. Stable bilateral lung opacities are noted concerning for multifocal pneumonia or edema. No significant pleural effusion is noted. Left subclavian catheter is unchanged in position. Bony thorax is unremarkable. IMPRESSION: Stable support apparatus. Stable bilateral lung opacities are noted concerning for multifocal pneumonia or edema. Electronically Signed   By: Marijo Conception M.D.   On: 09/13/2019 08:30   DG CHEST PORT 1 VIEW  Result Date: 09/12/2019 CLINICAL DATA:  44 year old male with pulmonary failure on ECMO. EXAM: PORTABLE CHEST 1 VIEW COMPARISON:  09/11/2019, 09/10/2019 FINDINGS: Cardiomediastinal silhouette unchanged with cardiomegaly. Low lung volumes persist with persisting mixed interstitial and airspace disease bilaterally. Overall aeration is similar to the comparison chest x-ray of yesterday. Air bronchograms of the left lung. Bilateral thoracostomy tubes unchanged. Unchanged right  IJ ECMO cannula. Unchanged tracheostomy. Unchanged left subclavian central venous catheter. No visualized pneumothorax. IMPRESSION: Unchanged appearance of the chest x-ray, with low lung volumes and mixed interstitial and airspace disease bilaterally. Unchanged tracheostomy, left  subclavian central venous catheter, bilateral thoracostomy tubes, and right IJ ECMO cannula. Electronically Signed   By: Corrie Mckusick D.O.   On: 09/12/2019 08:35     Medications:     Scheduled Medications: . sodium chloride   Intravenous Once  . vitamin C  500 mg Per Tube Daily  . chlorhexidine gluconate (MEDLINE KIT)  15 mL Mouth Rinse BID  . Chlorhexidine Gluconate Cloth  6 each Topical Daily  . diazepam  20 mg Per Tube Q6H  . feeding supplement (PRO-STAT SUGAR FREE 64)  60 mL Per Tube TID  . free water  300 mL Per Tube Q8H  . gabapentin  200 mg Per Tube Q8H  . HYDROmorphone  16 mg Per Tube Q4H  . insulin aspart  0-20 Units Subcutaneous Q4H  . ipratropium-albuterol  3 mL Nebulization Q4H  . mouth rinse  15 mL Mouth Rinse 10 times per day  . montelukast  10 mg Per Tube QHS  . oxymetazoline  1 spray Each Nare BID  . pantoprazole sodium  40 mg Per Tube QHS  . propranolol  20 mg Per Tube TID  . QUEtiapine  50 mg Per Tube BID  . sodium chloride flush  10-40 mL Intracatheter Q12H  . Thrombi-Pad  1 each Topical Once  . Thrombi-Pad  1 each Topical Once  . white petrolatum   Topical BID  . zinc sulfate  220 mg Per Tube Daily    Infusions: . sodium chloride    . sodium chloride Stopped (09/05/19 1900)  . sodium chloride 10 mL/hr at 09/11/19 2156  . albumin human 12.5 g (09/13/19 0049)  . amiodarone 30 mg/hr (09/13/19 0900)  . dexmedetomidine (PRECEDEX) IV infusion 1.3 mcg/kg/hr (09/13/19 0910)  . feeding supplement (PIVOT 1.5 CAL)    . heparin 1,800 Units/hr (09/13/19 0900)  . HYDROmorphone 3.5 mg/hr (09/13/19 0900)  . midazolam Stopped (09/13/19 0835)  . norepinephrine 4 mcg/min (09/13/19 0900)    PRN Medications: Place/Maintain arterial line **AND** sodium chloride, sodium chloride, sodium chloride, acetaminophen (TYLENOL) oral liquid 160 mg/5 mL, acetaminophen, albumin human, hydrALAZINE, HYDROmorphone, magic mouthwash, magnesium hydroxide, midazolam, ondansetron (ZOFRAN)  IV, senna-docusate, sodium chloride   Assessment/Plan:    1. Acute hypoxic/hypercarbic respiratory failure due to COVID PNA: Remained markedly hypoxic despite full support. Intubated 12/26. S/p bilateral CTs 12/26 for pneumothoraces.  TEE on 12/26 LVEF 70% RV ok. VV ECMO begun 12/26. Tracheostomy 12/29.  COVID ARDS at this point. ECMO circuit changed 1/8 & 1/23. Crescent cannula placed 2/2 (femoral cannuals removed) - ECMO parameters as per ECMO note - Flows look very good on [name]. Transient recirculation last night. Improved now. Sats and CXR improved - LDH 320 - Continue heparin. Discussed dosing with PharmD personally. - Off lasix gtt due to azotemia. Got 1 dose lasix last night - Bronch by CCM 2/4 ok  - Begin to wean sedation. Once awake re-try sweep trial  - Continue chest PT  2. Bilateral PTX: Due to barotrauma.  - Driving pressure minimized at < 15 Stable. No change  3. COVID PNA: management of COVID infection per CCM. CXR with bilateral multifocal PNA progressing to likely ARDS.  - s/p 10 days of remdesivir - 12/16, 12/2 s/p tociluzimab - 12/17 s/p convalescent plasma - Decadron at 71m/daily completed 1/11 -  Completed cefepime/vanc - Off Ancef - 1 dose ceftriaxone last night for Foley. UA ok. Can stop  4. ID: Empiric coverage with vancomycin/cefepime initially for possible secondary bacterial PNA, course completed.   - treated with cefepime and vanc for + cx with staph epidimidis, MSSA and serratia (sensitivities ok). - Currently off abx - BAL cx. With serratia and enterococcus felt to be colonization - Watch cx. Low threshold to add back abx. - 1 dose ceftriaxone last night for Foley. UA ok. Can stop  - WBC 18.2 ->15.2 -> 10.8  5. Thrombocytopenia: Slow decrease in platelets, likely low level hemolysis + critical illness.  - resolved. Platelets 174k -> 123k today Follow  6. Anemia: Bleeding at trach site resolved but still with epistaxis - hgb 8.4  today -  Transfuse hgb < 8  7. FEN - now with G-tube. Resume TF  8. Hypernatremia - NA 155-> 152 -> 151-> 152. -> 153Continue FW flushes. Lasix on hold. Dose PRN  9. AKI - lasix on hold. BUN > 100. May need some IVFs  D/w with ECMO team (ECMO coordinator/specialists, CCM, TCTS and pharmD) at bedside on multidisciplinary rounds. Continue to await ARDS recovery. Details as above.   Length of Stay: 22  CRITICAL CARE Performed by: Glori Bickers  Total critical care time: 45 minutes  Critical care time was exclusive of separately billable procedures and treating other patients.  Critical care was necessary to treat or prevent imminent or life-threatening deterioration.  Critical care was time spent personally by me (independent of midlevel providers or residents) on the following activities: development of treatment plan with patient and/or surrogate as well as nursing, discussions with consultants, evaluation of patient's response to treatment, examination of patient, obtaining history from patient or surrogate, ordering and performing treatments and interventions, ordering and review of laboratory studies, ordering and review of radiographic studies, pulse oximetry and re-evaluation of patient's condition.    Glori Bickers MD 09/13/2019, 9:19 AM  Advanced Heart Failure Team Pager [phone] (M-F; 7a - 4p)  Please contact [facility] for night-coverage after hours (4p -7a ) and weekends on [email or web address]

## 2019-09-13 NOTE — Evaluation (Signed)
Physical Therapy Evaluation Patient Details Name: ASHIR KUNZ MRN: [id number] DOB: [birth year] Today's Date: 09/13/2019   History of Present Illness  44 y/o M admitted 12/14 with COVID pneumonia causing acute hypoxemic respiratory failure.   Developed pneumomediastinum 12/23 with concern for possible bilateral small pneumothoraces on 12/24. Pt intubated on 12/26 and started on ECMO on 12/27. Tracheostomy on 12/29 with bleeding from trach an dother sites on 12/31. Bronch on 1/11 and 1/15. Slowly weaning sedation 2/5.  Clinical Impression  Pt presents to PT still weaning sedation at this time. PT assessment of ROM and vital sign response to ROM. Pt tolerates ROM well, UE ROM grossly WFL excluding shoulder ROM assessment not occurring due to line placement. Pt with knee and hip ROM restrictions at this time which may limit functional mobility progression some once pt becomes alert. PT will follow up next week if patient becomes alert and able to participate in skilled PT intervention, or if family would like to come for education on ROM HEP. If attempts at weaning sedation fail and patient is unable to follow commands or participate in skilled mobility then acute PT will sign off at that time. PT encourages nursing staff to perform UE and LE PROM to maintain patients current ROM at this time, in hopes of mobilizing if the patient is able to fully wean sedation and participate in skilled PT intervention.    Follow Up Recommendations LTACH    Equipment Recommendations  Wheelchair (measurements PT);Wheelchair cushion (measurements PT);Hospital bed;Other (comment)(mechanical lift)    Recommendations for Other Services       Precautions / Restrictions Precautions Precautions: Fall;Other (comment) Precaution Comments: ECMO R IJ, Vent to trach, G tube Restrictions Weight Bearing Restrictions: No      Mobility  Bed Mobility Overal bed mobility: (deferred, pt sedated and unable to participate in  mobility)                Transfers                    Ambulation/Gait                Stairs            Wheelchair Mobility    Modified Rankin (Stroke Patients Only)       Balance Overall balance assessment: (deferred, pt still sedated and unable to participate)                                           Pertinent Vitals/Pain Pain Assessment: Faces Pain Score: 0-No pain    Home Living Family/patient expects to be discharged to:: Private residence Living Arrangements: Spouse/significant other Available Help at Discharge: Family Type of Home: House Home Access: Stairs to enter Entrance Stairs-Rails: Psychiatric nurse of Steps: 4 Home Layout: One level Home Equipment: None Additional Comments: history obtained from chart as pt paralyzed and sedation being weaned    Prior Function Level of Independence: Independent         Comments: Pt independent in ADLs, IADLs, and mobility. Pt drives and works at [employer]. Pt does not use oxygen at home.     Hand Dominance   Dominant Hand: Right    Extremity/Trunk Assessment   Upper Extremity Assessment Upper Extremity Assessment: RUE deficits/detail;LUE deficits/detail RUE Deficits / Details: finger, wrist, elbow ROM WFL, shoulder ROM deferred 2/2 R IJ  ECMO cath RUE Sensation: (unable to assess) LUE Deficits / Details: finger, wrist, elbow ROM WFL, shoulder ROM deferred 2/2 ventilator lines LUE Sensation: (unable to assess)    Lower Extremity Assessment Lower Extremity Assessment: RLE deficits/detail;LLE deficits/detail RLE Deficits / Details: DF of ~5 degrees past neutral with PT PROM and stretching, pt with wounds on bilateral feet. Knee flexion to ~70 degrees passively, hip flexion to ~60 degrees passively, hip and knee extension WFL LLE Deficits / Details: DF of ~5 degrees past neutral with PT PROM and stretching, pt with wounds on bilateral feet.  Knee flexion to ~70 degrees passively, hip flexion to ~60 degrees passively, hip and knee extension WFL    Cervical / Trunk Assessment Cervical / Trunk Assessment: Other exceptions Cervical / Trunk Exceptions: morbid obesity  Communication   Communication: Tracheostomy  Cognition Arousal/Alertness: Suspect due to medications(weaning sedation) Behavior During Therapy: (unable to assess) Overall Cognitive Status: (unable to assess)                                        General Comments General comments (skin integrity, edema, etc.): Vital signs stable throughout evaluation, HR, BP rising appropriately with mobility, SpO2 ranging from 89-92 with mobility    Exercises General Exercises - Upper Extremity Elbow Flexion: PROM;10 reps;Both Elbow Extension: PROM;Both;10 reps Wrist Flexion: PROM;Both;10 reps Wrist Extension: PROM;Both;10 reps Digit Composite Flexion: PROM;Both;10 reps Composite Extension: PROM;Both;10 reps General Exercises - Lower Extremity Ankle Circles/Pumps: PROM;Both;20 reps Other Exercises Other Exercises: knee flexion/extension 15 reps bilaterally PROM Other Exercises: hip flexion and extensio to neutral for 15 reps PROM bilaterally Other Exercises: hip abduction/adduction 10 reps PROM bilaterally   Assessment/Plan    PT Assessment Patient needs continued PT services  PT Problem List Decreased strength;Decreased range of motion;Decreased activity tolerance       PT Treatment Interventions DME instruction;Gait training;Functional mobility training;Therapeutic activities;Therapeutic exercise;Balance training;Neuromuscular re-education;Patient/family education;Wheelchair mobility training    PT Goals (Current goals can be found in the Care Plan section)  Acute Rehab PT Goals Patient Stated Goal: pt unable to state, trach to vent PT Goal Formulation: Patient unable to participate in goal setting Time For Goal Achievement: 09/27/19 Potential to  Achieve Goals: Poor    Frequency Min 1X/week(monitor)   Barriers to discharge        Co-evaluation               AM-PAC PT "6 Clicks" Mobility  Outcome Measure Help needed turning from your back to your side while in a flat bed without using bedrails?: Total Help needed moving from lying on your back to sitting on the side of a flat bed without using bedrails?: Total Help needed moving to and from a bed to a chair (including a wheelchair)?: Total Help needed standing up from a chair using your arms (e.g., wheelchair or bedside chair)?: Total Help needed to walk in hospital room?: Total Help needed climbing 3-5 steps with a railing? : Total 6 Click Score: 6    End of Session Equipment Utilized During Treatment: Oxygen Activity Tolerance: Patient tolerated treatment well;Treatment limited secondary to medical complications (Comment) Patient left: in bed;with call bell/phone within reach;with nursing/sitter in room Nurse Communication: Mobility status PT Visit Diagnosis: Muscle weakness (generalized) (M62.81)    Time: 8127-5170 PT Time Calculation (min) (ACUTE ONLY): 19 min   Charges:   PT Evaluation $PT Eval High Complexity: 1 High  Zenaida Niece, PT, DPT Acute Rehabilitation Pager: [phone]   Zenaida Niece 09/13/2019, 5:00 PM

## 2019-09-14 ENCOUNTER — Inpatient Hospital Stay (HOSPITAL_COMMUNITY): Payer: Managed Care, Other (non HMO)

## 2019-09-14 DIAGNOSIS — J80 Acute respiratory distress syndrome: Secondary | ICD-10-CM

## 2019-09-14 DIAGNOSIS — Z8616 Personal history of COVID-19: Secondary | ICD-10-CM

## 2019-09-14 LAB — LACTIC ACID, PLASMA: Lactic Acid, Venous: 0.9 mmol/L (ref 0.5–1.9)

## 2019-09-14 LAB — PREPARE RBC (CROSSMATCH)

## 2019-09-14 LAB — POCT I-STAT 7, (LYTES, BLD GAS, ICA,H+H)
Acid-Base Excess: 4 mmol/L — ABNORMAL HIGH (ref 0.0–2.0)
Acid-Base Excess: 4 mmol/L — ABNORMAL HIGH (ref 0.0–2.0)
Bicarbonate: 29.1 mmol/L — ABNORMAL HIGH (ref 20.0–28.0)
Bicarbonate: 29.2 mmol/L — ABNORMAL HIGH (ref 20.0–28.0)
Calcium, Ion: 1.31 mmol/L (ref 1.15–1.40)
Calcium, Ion: 1.31 mmol/L (ref 1.15–1.40)
HCT: 25 % — ABNORMAL LOW (ref 39.0–52.0)
HCT: 27 % — ABNORMAL LOW (ref 39.0–52.0)
Hemoglobin: 8.5 g/dL — ABNORMAL LOW (ref 13.0–17.0)
Hemoglobin: 9.2 g/dL — ABNORMAL LOW (ref 13.0–17.0)
O2 Saturation: 92 %
O2 Saturation: 89 %
Patient temperature: 99.2
Patient temperature: 36.9
Potassium: 3.9 mmol/L (ref 3.5–5.1)
Potassium: 3.4 mmol/L — ABNORMAL LOW (ref 3.5–5.1)
Sodium: 156 mmol/L — ABNORMAL HIGH (ref 135–145)
Sodium: 156 mmol/L — ABNORMAL HIGH (ref 135–145)
TCO2: 30 mmol/L (ref 22–32)
TCO2: 31 mmol/L (ref 22–32)
pCO2 arterial: 45.6 mmHg (ref 32.0–48.0)
pCO2 arterial: 46.3 mmHg (ref 32.0–48.0)
pH, Arterial: 7.415 (ref 7.350–7.450)
pH, Arterial: 7.407 (ref 7.350–7.450)
pO2, Arterial: 64 mmHg — ABNORMAL LOW (ref 83.0–108.0)
pO2, Arterial: 57 mmHg — ABNORMAL LOW (ref 83.0–108.0)

## 2019-09-14 LAB — BASIC METABOLIC PANEL
Anion gap: 11 (ref 5–15)
Anion gap: 11 (ref 5–15)
BUN: 95 mg/dL — ABNORMAL HIGH (ref 6–20)
BUN: 91 mg/dL — ABNORMAL HIGH (ref 6–20)
CO2: 28 mmol/L (ref 22–32)
CO2: 27 mmol/L (ref 22–32)
Calcium: 9.4 mg/dL (ref 8.9–10.3)
Calcium: 9.4 mg/dL (ref 8.9–10.3)
Chloride: 115 mmol/L — ABNORMAL HIGH (ref 98–111)
Chloride: 117 mmol/L — ABNORMAL HIGH (ref 98–111)
Creatinine, Ser: 1.21 mg/dL (ref 0.61–1.24)
Creatinine, Ser: 1.17 mg/dL (ref 0.61–1.24)
GFR calc Af Amer: >60 mL/min (ref >60)
GFR calc Af Amer: >60 mL/min (ref >60)
GFR calc non Af Amer: >60 mL/min (ref >60)
GFR calc non Af Amer: >60 mL/min (ref >60)
Glucose, Bld: 117 mg/dL — ABNORMAL HIGH (ref 70–99)
Glucose, Bld: 144 mg/dL — ABNORMAL HIGH (ref 70–99)
Potassium: 3.9 mmol/L (ref 3.5–5.1)
Potassium: 3.4 mmol/L — ABNORMAL LOW (ref 3.5–5.1)
Sodium: 154 mmol/L — ABNORMAL HIGH (ref 135–145)
Sodium: 155 mmol/L — ABNORMAL HIGH (ref 135–145)

## 2019-09-14 LAB — CBC
HCT: 29 % — ABNORMAL LOW (ref 39.0–52.0)
HCT: 32 % — ABNORMAL LOW (ref 39.0–52.0)
Hemoglobin: 8.3 g/dL — ABNORMAL LOW (ref 13.0–17.0)
Hemoglobin: 9.5 g/dL — ABNORMAL LOW (ref 13.0–17.0)
MCH: 27.9 pg (ref 26.0–34.0)
MCH: 28.3 pg (ref 26.0–34.0)
MCHC: 28.6 g/dL — ABNORMAL LOW (ref 30.0–36.0)
MCHC: 29.7 g/dL — ABNORMAL LOW (ref 30.0–36.0)
MCV: 97.6 fL (ref 80.0–100.0)
MCV: 95.2 fL (ref 80.0–100.0)
Platelets: 132 10*3/uL — ABNORMAL LOW (ref 150–400)
Platelets: 137 10*3/uL — ABNORMAL LOW (ref 150–400)
RBC: 2.97 MIL/uL — ABNORMAL LOW (ref 4.22–5.81)
RBC: 3.36 MIL/uL — ABNORMAL LOW (ref 4.22–5.81)
RDW: 19.1 % — ABNORMAL HIGH (ref 11.5–15.5)
RDW: 19.2 % — ABNORMAL HIGH (ref 11.5–15.5)
WBC: 9.6 10*3/uL (ref 4.0–10.5)
WBC: 10.8 10*3/uL — ABNORMAL HIGH (ref 4.0–10.5)
nRBC: 0.2 % (ref 0.0–0.2)
nRBC: 0.2 % (ref 0.0–0.2)

## 2019-09-14 LAB — COOXEMETRY PANEL
Carboxyhemoglobin: 2.7 % — ABNORMAL HIGH (ref 0.5–1.5)
Methemoglobin: 0.6 % (ref 0.0–1.5)
O2 Saturation: 63.2 %
Total hemoglobin: 9.9 g/dL — ABNORMAL LOW (ref 12.0–16.0)

## 2019-09-14 LAB — GLUCOSE, CAPILLARY
Glucose-Capillary: 100 mg/dL — ABNORMAL HIGH (ref 70–99)
Glucose-Capillary: 109 mg/dL — ABNORMAL HIGH (ref 70–99)
Glucose-Capillary: 111 mg/dL — ABNORMAL HIGH (ref 70–99)
Glucose-Capillary: 121 mg/dL — ABNORMAL HIGH (ref 70–99)
Glucose-Capillary: 122 mg/dL — ABNORMAL HIGH (ref 70–99)
Glucose-Capillary: 126 mg/dL — ABNORMAL HIGH (ref 70–99)
Glucose-Capillary: 146 mg/dL — ABNORMAL HIGH (ref 70–99)

## 2019-09-14 LAB — FIBRINOGEN: Fibrinogen: 695 mg/dL — ABNORMAL HIGH (ref 210–475)

## 2019-09-14 LAB — APTT
aPTT: 89 seconds — ABNORMAL HIGH (ref 24–36)
aPTT: 77 seconds — ABNORMAL HIGH (ref 24–36)

## 2019-09-14 LAB — LACTATE DEHYDROGENASE: LDH: 302 U/L — ABNORMAL HIGH (ref 98–192)

## 2019-09-14 LAB — HEPARIN LEVEL (UNFRACTIONATED)
Heparin Unfractionated: 0.26 IU/mL — ABNORMAL LOW (ref 0.30–0.70)
Heparin Unfractionated: 0.27 IU/mL — ABNORMAL LOW (ref 0.30–0.70)

## 2019-09-14 MED ORDER — FREE WATER
400.0000 mL | Freq: Four times a day (QID) | Status: DC
  Administered 2019-09-14: 400 mL

## 2019-09-14 MED ORDER — FREE WATER
500.0000 mL | Freq: Four times a day (QID) | Status: DC
  Administered 2019-09-14 – 2019-09-18 (×15): 500 mL

## 2019-09-14 MED ORDER — SODIUM CHLORIDE 0.9% IV SOLUTION: Freq: Once | INTRAVENOUS | Status: AC

## 2019-09-14 MED ORDER — HYDRALAZINE HCL 10 MG PO TABS
10.0000 mg | ORAL_TABLET | Freq: Three times a day (TID) | ORAL | Status: DC
  Administered 2019-09-14 – 2019-09-19 (×9): 10 mg
  Filled 2019-09-14 (×10): qty 1

## 2019-09-14 MED ORDER — PIVOT 1.5 CAL PO LIQD
1000.0000 mL | ORAL | Status: DC
  Administered 2019-09-14 – 2019-09-16 (×3): 1000 mL
  Filled 2019-09-14 (×4): qty 1000

## 2019-09-14 MED ORDER — POTASSIUM CHLORIDE CRYS ER 20 MEQ PO TBCR
40.0000 meq | EXTENDED_RELEASE_TABLET | Freq: Once | ORAL | Status: AC
  Administered 2019-09-14: 40 meq via ORAL
  Filled 2019-09-14: qty 2

## 2019-09-14 MED ORDER — AMIODARONE HCL 200 MG PO TABS
200.0000 mg | ORAL_TABLET | Freq: Every day | ORAL | Status: DC
  Administered 2019-09-14 – 2019-10-05 (×22): 200 mg via ORAL
  Filled 2019-09-14 (×22): qty 1

## 2019-09-14 NOTE — Progress Notes (Signed)
NAME:  JANE BROUGHTON, MRN:  [id number], DOB:  [birth year], LOS: 107 ADMISSION DATE:  07/22/2019, CONSULTATION DATE:  12/24 REFERRING MD:  Sloan Leiter, CHIEF COMPLAINT:  Dyspnea   Brief History   44 y/o M admitted 12/14 with COVID pneumonia causing acute hypoxemic respiratory failure. Developed pneumomediastinum 12/23 with concern for possible bilateral small pneumothoraces on 12/24.  PCCM consulted for evaluation of pneumothoraces  Past Medical History  GERD HTN Asthma  Significant Hospital Events   12/14 admit with hypoxemic respiratory failure in setting of COVID-19 pneumonia 12/23 noted to have pneumomediastinum on chest x-ray 12/24 PCCM consulted for evaluation 12/26 worsening hypoxemia, intubated  12/27 VV fem/fem ECMO cannulation See bedside nursing event notes for full rundown of procedures after 12/27 1/30 improved cxr, improved lung compliance, TV 350s on 15PC  1/31 CXR increased infiltrates, +CFB     2/2 Converted to right IJ Crescent cannula. 2/3 PEG tube inserted at bedside  Consults:  PCCM, CTS, CHF, Palliative   Significant Diagnostic Tests:   CT chest 12/22 >> extensive pneumomediastinum, multifocal patchy bilateral groundglass opacities CXR 1/25 >> improving bilateral infiltrates. ECMO cannulae in place. CXR 2/5 >> clearing bilateral infiltrates  Micro Data:  BCx2 12/14 >> negative  Sputum 12/15 >> negative  1/9 bronch: serratia and MSSA 1/2 acid fast smear bronch: negative 1/2 fungal cx bronch: pending 1/12 BAL: serratia 1/18 BAL >> rare Serratia. 2/1 BAL >> rare Serratia - few WBC  Antimicrobials:  Received remdesivir, steroids, actemra and convalescent plasma  5 days cefepime 12/24-12/29 Cefepime 1/4->1/14 vanc 1/4->1/14 Ceftriaxone 1/14 >>1/27 Cefazolin 1/28>> 02/2   Interim history/subjective:   Tolerated sweep reduction. Off sedative infusions except for Precedex. Tidal volumes up to 466m  Objective   Blood pressure (!) 130/48, pulse 73,  temperature 98.7 F (37.1 C), temperature source Oral, resp. rate 20, height _0  (1.727 m), weight (!) 140.4 kg, SpO2 93 %. CVP:  [6 mmHg-18 mmHg] 7 mmHg  Vent Mode: PCV FiO2 (%):  [40 %-70 %] 40 % Set Rate:  [15 bmp] 15 bmp PEEP:  [16 cmH20] 16 cmH20 Plateau Pressure:  [24 cmH20] 24 cmH20   Intake/Output Summary (Last 24 hours) at 09/14/2019 0744 Last data filed at 09/14/2019 0700 Gross per 24 hour  Intake 4946.78 ml  Output 2870 ml  Net 2076.78 ml   Filed Weights   09/12/19 0500 09/13/19 0500 09/14/19 0500  Weight: (!) 137.1 kg (!) 141.3 kg (!) 140.4 kg   Examination GEN: ill obese man on vent HEENT: trach in place, no obvious oral or nasal bleeding. CV: distant, ext warm PULM: improved vesicular breath sounds anteriorly, no wheezing. Bronchial breath sounds have receded to mid-axillary line. Breath stacking, improved with increased Ti. Unable to draw adequate Vt on PSV. Too dyssynchronous for PRVC. GI: Soft, +BS EXT: diffuse anasarca improved NEURO: opens eyes to voice, occasional coughing on vent PSYCH: deferred SKIN: slightly pale  Pressure Injury 09/08/19 Cervical Medial Stage 3 -  Full thickness tissue loss. Subcutaneous fat may be visible but bone, tendon or muscle are NOT exposed. quarter size, snoozing (Active)  09/08/19 1600  Location: Cervical  Location Orientation: Medial  Staging: Stage 3 -  Full thickness tissue loss. Subcutaneous fat may be visible but bone, tendon or muscle are NOT exposed.  Wound Description (Comments): quarter size, snoozing  Present on Admission: No     Pressure Injury 09/12/19 Heel Left;Posterior;Lateral Deep Tissue Pressure Injury - Purple or maroon localized area of discolored intact skin or blood-filled blister  due to damage of underlying soft tissue from pressure and/or shear. deep purple (Active)  09/12/19 0430  Location: Heel  Location Orientation: Left;Posterior;Lateral  Staging: Deep Tissue Pressure Injury - Purple or maroon  localized area of discolored intact skin or blood-filled blister due to damage of underlying soft tissue from pressure and/or shear.  Wound Description (Comments): deep purple  Present on Admission: No     Pressure Injury 09/12/19 Foot Right;Posterior;Lateral Deep Tissue Pressure Injury - Purple or maroon localized area of discolored intact skin or blood-filled blister due to damage of underlying soft tissue from pressure and/or shear. deep purple (Active)  09/12/19 0430  Location: Foot  Location Orientation: Right;Posterior;Lateral  Staging: Deep Tissue Pressure Injury - Purple or maroon localized area of discolored intact skin or blood-filled blister due to damage of underlying soft tissue from pressure and/or shear.  Wound Description (Comments): deep purple  Present on Admission: No    CT head 1/28: pansinusitis. CT chest 1/28: Dense bilateral consolidation in dependent lungs both sides CT abdomen 1/28: Cannulae in position in IVC.  Flow (LPM): 5.01 ECMO Mode: VV ECMO Device: Cardiohelp  $ Ventilator Initial/Subsequent : Subsequent, Vent Mode: PCV, Set Rate: 15 bmp, FiO2 (%): 40 %, I Time: 1.15 Sec(s), PEEP: 16 cmH20 Vt today 361m  Net IO Since Admission: 45,020.91 mL [09/14/19 0744]  Resolved Hospital Problem list   MSSA and serratia marcescens pneumonia   Assessment & Plan:   Acute hypoxemic respiratory failure with ARDS secondary to COVID-19, status post tracheostomy on mechanical ventilation and VV ECMO support.  Slowly resolving ARDS. Limited respiratory reserve with persistently high burden of disease, in keeping with natural history of COVID related ARDS.  Generally easier flow following cannula change. - No Bronch today - Continue lung protective ventilation.  - No diuresis today as CVP 7 - Allow periods of more spontaneous breathing alternating with rest.   Posterior epistaxis appears to have resolved following a period of anticoagulation interruption. Current  bleeding is oral and possibly subsequent to traumatic TEE insertion.  - Continue to monitor  Episode of AF. Currently on amiodarone. - transition to oral amiodarone once enteral access readily available.  Hypernatremia - increased free water  Daily Goals Checklist   Pain/Anxiety/Delirium protocol (if indicated): keep infusions off. Continue enteral agents. Dexmedetomidine and prn Versed and Dilaudid VAP protocol (if indicated): Bundle in place. Respiratory support goals: Pressure control ventilation with ultra protective lung ventilation. Titrate FiO2 down again. Continue PEEP 16  Blood pressure target:  MAP>65, Continue NE. Continue iv amiodarone until off iv sedation and then consider transition to orals. DVT prophylaxis: Remains on systemic heparin - target Heparin level 0.3-0.7 Nutrition Status: Nutrition Problem: Increased nutrient needs Etiology: acute illness(COVID PNA) Signs/Symptoms: estimated needs Interventions: Tube feeding, Prostat Aim for lower caloric goal. Resume trickle feeds today. GI prophylaxis: Protonix per tube. Fluid status goals: no diuresis today Urinary catheter: Guide hemodynamic management Glucose control: Euglycemic control with SSI Mobility/therapy needs: Bedrest.  Daily labs: as per ECMO protocol. Code Status: Full code  Family Communication:family updated by RN. Disposition: ICU  Labs   CBC: CBC Latest Ref Rng & Units 09/14/2019 09/14/2019 09/13/2019  WBC 4.0 - 10.5 K/uL - 9.6 -  Hemoglobin 13.0 - 17.0 g/dL 8.5(L) 8.3(L) 8.2(L)  Hematocrit 39.0 - 52.0 % 25.0(L) 29.0(L) 24.0(L)  Platelets 150 - 400 K/uL - 132(L) -    Basic Metabolic Panel: BMP Latest Ref Rng & Units 09/14/2019 09/14/2019 09/13/2019  Glucose 70 - 99 mg/dL - 117(H) -  BUN 6 - 20 mg/dL - 95(H) -  Creatinine 0.61 - 1.24 mg/dL - 1.21 -  Sodium 135 - 145 mmol/L 156(H) 154(H) 156(H)  Potassium 3.5 - 5.1 mmol/L 3.9 3.9 3.7  Chloride 98 - 111 mmol/L - 115(H) -  CO2 22 - 32 mmol/L - 28 -   Calcium 8.9 - 10.3 mg/dL - 9.4 -    GFR: Estimated Creatinine Clearance: 106 mL/min (by C-G formula based on SCr of 1.21 mg/dL). Recent Labs  Lab 09/11/19 0458 09/11/19 1739 09/12/19 0408 09/12/19 1633 09/13/19 0021 09/13/19 0325 09/13/19 1631 09/14/19 0408  WBC 18.2*   < > 15.2*   < > 9.8 10.8* 11.2* 9.6  LATICACIDVEN 1.1  --  1.1  --   --  1.0  --  0.9   < > = values in this interval not displayed.    Liver Function Tests: No results for input(s): AST, ALT, ALKPHOS, BILITOT, PROT, ALBUMIN in the last 168 hours. No results for input(s): LIPASE, AMYLASE in the last 168 hours. No results for input(s): AMMONIA in the last 168 hours.  ABG    Component Value Date/Time   PHART 7.415 09/14/2019 0418   PCO2ART 45.6 09/14/2019 0418   PO2ART 64.0 (L) 09/14/2019 0418   HCO3 29.1 (H) 09/14/2019 0418   TCO2 30 09/14/2019 0418   ACIDBASEDEF 2.0 08/28/2019 1519   O2SAT 63.2 09/14/2019 0420     Coagulation Profile: No results for input(s): INR, PROTIME in the last 168 hours.  Cardiac Enzymes: No results for input(s): CKTOTAL, CKMB, CKMBINDEX, TROPONINI in the last 168 hours.  HbA1C: Hgb A1c MFr Bld  Date/Time Value Ref Range Status  07/24/2019 04:50 AM 6.7 (H) 4.8 - 5.6 % Final    Comment:    (NOTE) Pre diabetes:          5.7%-6.4% Diabetes:              >6.4% Glycemic control for   <7.0% adults with diabetes   09/30/2017 05:36 AM 5.7 (H) 4.8 - 5.6 % Final    Comment:    (NOTE)         Prediabetes: 5.7 - 6.4         Diabetes: >6.4         Glycemic control for adults with diabetes: <7.0     CBG: Recent Labs  Lab 09/13/19 1636 09/13/19 1952 09/14/19 0002 09/14/19 0415 09/14/19 0736  GLUCAP 143* 92 121* 109* 111*     CRITICAL CARE Performed by: Kipp Brood   Total critical care time: 60 minutes  Critical care time was exclusive of separately billable procedures and treating other patients.  Critical care was necessary to treat or prevent imminent  or life-threatening deterioration.  Critical care was time spent personally by me on the following activities: development of treatment plan with patient and/or surrogate as well as nursing, discussions with consultants, evaluation of patient's response to treatment, examination of patient, obtaining history from patient or surrogate, ordering and performing treatments and interventions, ordering and review of laboratory studies, ordering and review of radiographic studies, pulse oximetry, re-evaluation of patient's condition and participation in multidisciplinary rounds.  Kipp Brood, MD [facility] ICU Physician [place]  Pager: [phone] Mobile: [phone] After hours: [phone].

## 2019-09-14 NOTE — Progress Notes (Signed)
ANTICOAGULATION CONSULT NOTE - Follow Up Consult  Pharmacy Consult for heparin Indication: ECMO  Labs: Recent Labs    09/13/19 1631 09/13/19 1640 09/14/19 0408 09/14/19 0408 09/14/19 0418 09/14/19 0418 09/14/19 1620 09/14/19 1635  HGB 8.9*   < > 8.3*   < > 8.5*   < > 9.5* 9.2*  HCT 30.4*   < > 29.0*   < > 25.0*  --  32.0* 27.0*  PLT 144*  --  132*  --   --   --  137*  --   APTT 89*  --  89*  --   --   --  77*  --   HEPARINUNFRC 0.37  --  0.26*  --   --   --  0.27*  --   CREATININE 1.40*  --  1.21  --   --   --  1.17  --    < > = values in this interval not displayed.    Assessment: 44yo male on ECMO heparin drip 1850 units/hr HL still low 0.27.  No overt bleeding - still some bleeding from nose and clot in back of throat  - improved. Nasal packing removed 1/29  Goal of Therapy:  Heparin level 0.3-0.5 units/ml (aiming for lowest end)   Plan:  Increase heparin slightly to 1900 uts/hr  Check heparin  q12h  Monitor s/s bleeding  Benetta Spar, PharmD, BCPS, BCCP Clinical Pharmacist  Please check AMION for all [place] phone numbers After 10:00 PM, call [place] [phone]

## 2019-09-14 NOTE — CV Procedure (Signed)
   ECMO NOTE:  Indication: Respiratory failure due to COVID PNA  Initial cannulation date: 08/03/2019  ECMO type: VV ECMO (Cardio-Help)  Dual lumen  Inflow/return cannula:  1) 32 FR Crescent placed 2/2  ECMO events:  Initial cannulation 12/26 Circuit changed and trach exchanged on 1/8.   Underwent addition of a RIJ drainage cannula on 1/20 Circuit changed on 1/22 for rising LDH Had evidence of recirculation and RIJ cannula pulled back on 1/26 19 FR RIJ removed 09/06/19 Underwent change to [facility] catheter with removal of femoral lines 2/2  Daily data:  Remains on VV ecmo with good flows.   Flow 4.99 RPM 3550 dP 30 Pven  -83 Sweep 8  SVO2 63%  Labs:  Hgb 8.3 LDH 450 -> 481 -> 508 -> 557 -> 555 -> 469 -> 367 -> 320  -> 302 Heparin level: 0.4  Plan:  Crescent cannula working well Excellent flow and sats CXR improving slowly  Continue to wean in 4 hour bursts then allow to rest with sedation Hold lasix with AKI Transfuse 1u RBC Increase FW Watch DTIs   Glori Bickers, MD  8:34 AM

## 2019-09-14 NOTE — Progress Notes (Signed)
Patient ID: [name], male   DOB: June 15, 1976, 44 y.o.   MRN: [id number]    Advanced Heart Failure Rounding Note   Subjective:    Remains on VV ecmo with good flows. Waking up this am but not following commands for me.   Sats high 90s. CVP 7. Sweep 8.   ABG 7.41/46/68/92%  Co-ox 63%   On vent now at 40% on PC.TVs ~180-350cc Personally reviewed  CXR improved. Crescent catheter well positioned   hgb 8.3 wbc 18.2 -> 15.2 -> 10.8 -> 9.6. Sodium 153-> 154  ECMO parameters - see separate ecmo note   Objective:   Weight Range:  Vital Signs:   Temp:  [98.2 F (36.8 C)-99.2 F (37.3 C)] 98.7 F (37.1 C) (02/06 0735) Pulse Rate:  [62-84] 73 (02/06 0700) Resp:  [10-29] 20 (02/06 0700) BP: (130-153)/(45-52) 130/48 (02/06 0459) SpO2:  [85 %-100 %] 93 % (02/06 0700) Arterial Line BP: (105-163)/(43-62) 150/47 (02/06 0700) FiO2 (%):  [40 %-60 %] 40 % (02/06 0459) Weight:  [140.4 kg] 140.4 kg (02/06 0500) Last BM Date: 09/13/19  Weight change: Filed Weights   09/12/19 0500 09/13/19 0500 09/14/19 0500  Weight: (!) 137.1 kg (!) 141.3 kg (!) 140.4 kg    Intake/Output:   Intake/Output Summary (Last 24 hours) at 09/14/2019 0827 Last data filed at 09/14/2019 0700 Gross per 24 hour  Intake 4652.69 ml  Output 2720 ml  Net 1932.69 ml     Physical Exam: General:  Eyes open. Awake on vent HEENT: normal Neck: supple. + Trach RIJ crescent Cor: PMI nondisplaced. Regular rate & rhythm. No rubs, gallops or murmurs. Lungs: slightly improved air movement Abdomen: obese  + G-tube soft, nontender, nondistended. No hepatosplenomegaly. No bruits or masses. Good bowel sounds. Extremities: no cyanosis, clubbing, rash, 2+ edema DTIs on both knees and feet + UNNA boots Neuro: awake on vent. Not following commands    Telemetry: Sinus 70-80ss  Personally reviewed  Labs: Basic Metabolic Panel: Recent Labs  Lab 09/11/19 0228 09/11/19 0458 09/12/19 0408 09/12/19 0434 09/12/19 1640  09/12/19 2007 09/13/19 0325 09/13/19 1144 09/13/19 1631 09/13/19 1640 09/13/19 1954 09/14/19 0408 09/14/19 0418  NA  --    < > 152*   < > 153*   < > 153*   < > 152* 154* 156* 154* 156*  K  --    < > 3.9   < > 4.1   < > 4.4   < > 4.3 4.3 3.7 3.9 3.9  CL  --    < > 111  --  113*  --  114*  --  114*  --   --  115*  --   CO2  --    < > 29  --  27  --  28  --  27  --   --  28  --   GLUCOSE  --    < > 133*  --  116*  --  132*  --  148*  --   --  117*  --   BUN  --    < > 98*  --  96*  --  103*  --  97*  --   --  95*  --   CREATININE  --    < > 1.34*  --  1.33*  --  1.38*  --  1.40*  --   --  1.21  --   CALCIUM  --    < > 9.6   < >  9.3   < > 9.4  --  9.5  --   --  9.4  --   MG 2.5*  --   --   --   --   --   --   --   --   --   --   --   --    < > = values in this interval not displayed.    Liver Function Tests: No results for input(s): AST, ALT, ALKPHOS, BILITOT, PROT, ALBUMIN in the last 168 hours. No results for input(s): LIPASE, AMYLASE in the last 168 hours. No results for input(s): AMMONIA in the last 168 hours.  CBC: Recent Labs  Lab 09/12/19 1633 09/12/19 2007 09/13/19 0021 09/13/19 0321 09/13/19 0325 09/13/19 1144 09/13/19 1631 09/13/19 1640 09/13/19 1954 09/14/19 0408 09/14/19 0418  WBC 11.7*  --  9.8  --  10.8*  --  11.2*  --   --  9.6  --   HGB 8.4*   < > 7.7*   < > 8.4*   < > 8.9* 9.5* 8.2* 8.3* 8.5*  HCT 28.0*   < > 27.3*   < > 28.1*   < > 30.4* 28.0* 24.0* 29.0* 25.0*  MCV 96.2  --  97.2  --  95.9  --  96.5  --   --  97.6  --   PLT 139*  --  124*  --  123*  --  144*  --   --  132*  --    < > = values in this interval not displayed.    Cardiac Enzymes: No results for input(s): CKTOTAL, CKMB, CKMBINDEX, TROPONINI in the last 168 hours.  BNP: BNP (last 3 results) Recent Labs    07/22/19 1039  BNP 15.3    ProBNP (last 3 results) No results for input(s): PROBNP in the last 8760 hours.    Other results:  Imaging: DG CHEST PORT 1 VIEW  Result Date:  09/13/2019 CLINICAL DATA:  Tracheostomy tube. EXAM: PORTABLE CHEST 1 VIEW COMPARISON:  September 12, 2019. FINDINGS: Stable cardiomediastinal silhouette. Tracheostomy tube is unchanged in position. Stable bilateral chest tubes are noted without pneumothorax. Stable bilateral lung opacities are noted concerning for multifocal pneumonia or edema. No significant pleural effusion is noted. Left subclavian catheter is unchanged in position. Bony thorax is unremarkable. IMPRESSION: Stable support apparatus. Stable bilateral lung opacities are noted concerning for multifocal pneumonia or edema. Electronically Signed   By: Marijo Conception M.D.   On: 09/13/2019 08:30     Medications:     Scheduled Medications: . sodium chloride   Intravenous Once  . sodium chloride   Intravenous Once  . amiodarone  200 mg Oral Daily  . vitamin C  500 mg Per Tube Daily  . chlorhexidine gluconate (MEDLINE KIT)  15 mL Mouth Rinse BID  . Chlorhexidine Gluconate Cloth  6 each Topical Daily  . diazepam  20 mg Per Tube Q6H  . feeding supplement (PRO-STAT SUGAR FREE 64)  60 mL Oral 5 X Daily  . free water  400 mL Per Tube Q6H  . gabapentin  200 mg Per Tube Q8H  . HYDROmorphone  16 mg Per Tube Q4H  . insulin aspart  0-20 Units Subcutaneous Q4H  . ipratropium-albuterol  3 mL Nebulization Q4H  . mouth rinse  15 mL Mouth Rinse 10 times per day  . montelukast  10 mg Per Tube QHS  . oxymetazoline  1 spray Each Nare BID  . pantoprazole  sodium  40 mg Per Tube QHS  . propranolol  20 mg Per Tube TID  . QUEtiapine  50 mg Per Tube BID  . sodium chloride flush  10-40 mL Intracatheter Q12H  . Thrombi-Pad  1 each Topical Once  . Thrombi-Pad  1 each Topical Once  . white petrolatum   Topical BID  . zinc sulfate  220 mg Per Tube Daily    Infusions: . sodium chloride    . sodium chloride Stopped (09/05/19 1900)  . sodium chloride 10 mL/hr at 09/11/19 2156  . albumin human 12.5 g (09/13/19 0049)  . dexmedetomidine (PRECEDEX) IV  infusion 1.2 mcg/kg/hr (09/14/19 0745)  . feeding supplement (PIVOT 1.5 CAL) 1,000 mL (09/13/19 1456)  . heparin 1,850 Units/hr (09/14/19 0743)  . HYDROmorphone Stopped (09/13/19 1440)  . midazolam Stopped (09/13/19 1241)  . norepinephrine Stopped (09/14/19 0544)    PRN Medications: Place/Maintain arterial line **AND** sodium chloride, sodium chloride, sodium chloride, acetaminophen (TYLENOL) oral liquid 160 mg/5 mL, acetaminophen, albumin human, hydrALAZINE, HYDROmorphone, magic mouthwash, magnesium hydroxide, midazolam, ondansetron (ZOFRAN) IV, senna-docusate, sodium chloride   Assessment/Plan:    1. Acute hypoxic/hypercarbic respiratory failure due to COVID PNA: Remained markedly hypoxic despite full support. Intubated 12/26. S/p bilateral CTs 12/26 for pneumothoraces.  TEE on 12/26 LVEF 70% RV ok. VV ECMO begun 12/26. Tracheostomy 12/29.  COVID ARDS at this point. ECMO circuit changed 1/8 & 1/23. Crescent cannula placed 2/2 (femoral cannuals removed) - ECMO parameters as per ECMO note - Flows look  good on Crescent. Sats and CXR improving - LDH 302 - Continue heparin. No bleeding. Discussed dosing with PharmD personally. - Off lasix gtt due to azotemia. Can dose as needed. He is 3rd spacing - Bronch by CCM 2/4 ok   - Continue chest PT - Continue 4 hour episodes of awakening and letting him work interspersed with sedation and rest periods.   2. Bilateral PTX: Due to barotrauma.  - Driving pressure minimized at < 15 Stable. (12 today)  No change  3. COVID PNA: management of COVID infection per CCM. CXR with bilateral multifocal PNA progressing to likely ARDS.  - s/p 10 days of remdesivir - 12/16, 12/2 s/p tociluzimab - 12/17 s/p convalescent plasma - Decadron at 21m/daily completed 1/11 - Completed cefepime/vanc - Off Ancef - 1 dose ceftriaxone last night for Foley. UA ok. Can stop  4. ID: Empiric coverage with vancomycin/cefepime initially for possible secondary bacterial  PNA, course completed.   - treated with cefepime and vanc for + cx with staph epidimidis, MSSA and serratia (sensitivities ok). - Currently off abx - BAL cx. With serratia and enterococcus felt to be colonization - Watch cx. Low threshold to add back abx. - 1 dose ceftriaxone last night for Foley. UA ok. Can stop  - WBC 18.2 ->15.2 -> 10.8 -> 9.6  5. Thrombocytopenia: Slow decrease in platelets, likely low level hemolysis + critical illness.  - resolved. Platelets 132  today Follow  6. Anemia: Bleeding at trach site resolved but still with epistaxis - hgb 8.3  today - Transfuse 1u  7. FEN - now with G-tube. Resume TF  8. Hypernatremia - NA 155-> 152 -> 151-> 152. -> 153 -> 154 - increasing FW  9. AKI - lasix on hold. Slightly improved - he is 3rd spacing. Continue unna boots. Increase FFW  10. DTIs - WOC following   D/w with ECMO team (ECMO coordinator/specialists, CCM, TCTS and pharmD) at bedside on multidisciplinary rounds. Continue to await ARDS  recovery. Details as above.   Length of Stay: 80  CRITICAL CARE Performed by: Glori Bickers  Total critical care time: 45  minutes  Critical care time was exclusive of separately billable procedures and treating other patients.  Critical care was necessary to treat or prevent imminent or life-threatening deterioration.  Critical care was time spent personally by me (independent of midlevel providers or residents) on the following activities: development of treatment plan with patient and/or surrogate as well as nursing, discussions with consultants, evaluation of patient's response to treatment, examination of patient, obtaining history from patient or surrogate, ordering and performing treatments and interventions, ordering and review of laboratory studies, ordering and review of radiographic studies, pulse oximetry and re-evaluation of patient's condition.    Glori Bickers MD 09/14/2019, 8:27 AM  Advanced Heart  Failure Team Pager [phone] (M-F; 7a - 4p)  Please contact [facility] for night-coverage after hours (4p -7a ) and weekends on [email or web address]

## 2019-09-14 NOTE — Progress Notes (Signed)
ANTICOAGULATION CONSULT NOTE - Follow Up Consult  Pharmacy Consult for heparin Indication: ECMO  Labs: Recent Labs    09/13/19 0325 09/13/19 1144 09/13/19 1631 09/13/19 1640 09/13/19 1954 09/13/19 1954 09/14/19 0408 09/14/19 0418  HGB 8.4*   < > 8.9*   < > 8.2*   < > 8.3* 8.5*  HCT 28.1*   < > 30.4*   < > 24.0*  --  29.0* 25.0*  PLT 123*  --  144*  --   --   --  132*  --   APTT 114*  --  89*  --   --   --  89*  --   HEPARINUNFRC 0.40  --  0.37  --   --   --  0.26*  --   CREATININE 1.38*  --  1.40*  --   --   --  1.21  --    < > = values in this interval not displayed.    Assessment: 44yo male on ECMO heparin drip 1800 uts/hr HL slightly low 0.26  No over bleeding - still come bleeding from nose and clot in back of throat  - improved  Nasal packing removed 1/29  Goal of Therapy:  Heparin level 0.3-0.5 units/ml (aiming for lowest end)   Plan:  Increase heparin slightly to 1850 uts/hr  Check heparin  q12h  Monitor s/s bleeding  Vertis Kelch, PharmD, [facility] PGY2 Cardiology Pharmacy Resident Phone [phone] 09/14/2019       7:22 AM  Please check [email or web address] for unit-specific pharmacist phone numbers

## 2019-09-14 NOTE — Progress Notes (Signed)
3 Days Post-Op Procedure(s) (LRB): PEG TUBE INSERTION - BEDSIDE (N/A) ESOPHAGOGASTRODUODENOSCOPY (EGD) (N/A) Subjective: Following commands  Objective: Vital signs in last 24 hours: Temp:  [98.2 F (36.8 C)-99.2 F (37.3 C)] 98.7 F (37.1 C) (02/06 0735) Pulse Rate:  [62-84] 73 (02/06 0700) Cardiac Rhythm: Normal sinus rhythm (02/05 2000) Resp:  [10-29] 20 (02/06 0700) BP: (130-153)/(45-52) 130/48 (02/06 0459) SpO2:  [85 %-100 %] 93 % (02/06 0700) Arterial Line BP: (105-163)/(43-62) 150/47 (02/06 0700) FiO2 (%):  [40 %-60 %] 40 % (02/06 0459) Weight:  [140.4 kg] 140.4 kg (02/06 0500)  Hemodynamic parameters for last 24 hours: CVP:  [6 mmHg-18 mmHg] 7 mmHg  Intake/Output from previous day: 02/05 0701 - 02/06 0700 In: 4946.8 [I.V.:2105.5; NG/GT:2421.3] Out: 2870 [Urine:2840; Chest Tube:30] Intake/Output this shift: No intake/output data recorded.  General appearance: sedated Neurologic: follows basic commands Heart: regular rate and rhythm, S1, S2 normal, no murmur, click, rub or gallop Lungs: diminished breath sounds bilaterally Abdomen: soft, non-tender; bowel sounds normal; no masses,  no organomegaly Extremities: edema 2+ Wound: several DTIs overlying edematous tissue and support devices  Lab Results: Recent Labs    09/13/19 1631 09/13/19 1640 09/14/19 0408 09/14/19 0418  WBC 11.2*  --  9.6  --   HGB 8.9*   < > 8.3* 8.5*  HCT 30.4*   < > 29.0* 25.0*  PLT 144*  --  132*  --    < > = values in this interval not displayed.   BMET:  Recent Labs    09/13/19 1631 09/13/19 1640 09/14/19 0408 09/14/19 0418  NA 152*   < > 154* 156*  K 4.3   < > 3.9 3.9  CL 114*  --  115*  --   CO2 27  --  28  --   GLUCOSE 148*  --  117*  --   BUN 97*  --  95*  --   CREATININE 1.40*  --  1.21  --   CALCIUM 9.5  --  9.4  --    < > = values in this interval not displayed.    PT/INR: No results for input(s): LABPROT, INR in the last 72 hours. ABG    Component Value  Date/Time   PHART 7.415 09/14/2019 0418   HCO3 29.1 (H) 09/14/2019 0418   TCO2 30 09/14/2019 0418   ACIDBASEDEF 2.0 08/28/2019 1519   O2SAT 63.2 09/14/2019 0420   CBG (last 3)  Recent Labs    09/14/19 0002 09/14/19 0415 09/14/19 0736  GLUCAP 121* 109* 111*    Assessment/Plan: S/P Procedure(s) (LRB): PEG TUBE INSERTION - BEDSIDE (N/A) ESOPHAGOGASTRODUODENOSCOPY (EGD) (N/A) continue supportive care  Transfuse one unit Prbc   LOS: 54 days    Wonda Olds 09/14/2019

## 2019-09-15 ENCOUNTER — Inpatient Hospital Stay (HOSPITAL_COMMUNITY): Payer: Managed Care, Other (non HMO)

## 2019-09-15 LAB — POCT I-STAT 7, (LYTES, BLD GAS, ICA,H+H)
Acid-Base Excess: 2 mmol/L (ref 0.0–2.0)
Acid-Base Excess: 3 mmol/L — ABNORMAL HIGH (ref 0.0–2.0)
Acid-Base Excess: 3 mmol/L — ABNORMAL HIGH (ref 0.0–2.0)
Acid-Base Excess: 3 mmol/L — ABNORMAL HIGH (ref 0.0–2.0)
Acid-Base Excess: 4 mmol/L — ABNORMAL HIGH (ref 0.0–2.0)
Acid-Base Excess: 5 mmol/L — ABNORMAL HIGH (ref 0.0–2.0)
Bicarbonate: 28.2 mmol/L — ABNORMAL HIGH (ref 20.0–28.0)
Bicarbonate: 28.2 mmol/L — ABNORMAL HIGH (ref 20.0–28.0)
Bicarbonate: 29.3 mmol/L — ABNORMAL HIGH (ref 20.0–28.0)
Bicarbonate: 29.3 mmol/L — ABNORMAL HIGH (ref 20.0–28.0)
Bicarbonate: 29.4 mmol/L — ABNORMAL HIGH (ref 20.0–28.0)
Bicarbonate: 31.2 mmol/L — ABNORMAL HIGH (ref 20.0–28.0)
Calcium, Ion: 1.29 mmol/L (ref 1.15–1.40)
Calcium, Ion: 1.31 mmol/L (ref 1.15–1.40)
Calcium, Ion: 1.32 mmol/L (ref 1.15–1.40)
Calcium, Ion: 1.33 mmol/L (ref 1.15–1.40)
Calcium, Ion: 1.34 mmol/L (ref 1.15–1.40)
Calcium, Ion: 1.34 mmol/L (ref 1.15–1.40)
HCT: 27 % — ABNORMAL LOW (ref 39.0–52.0)
HCT: 27 % — ABNORMAL LOW (ref 39.0–52.0)
HCT: 27 % — ABNORMAL LOW (ref 39.0–52.0)
HCT: 28 % — ABNORMAL LOW (ref 39.0–52.0)
HCT: 28 % — ABNORMAL LOW (ref 39.0–52.0)
HCT: 28 % — ABNORMAL LOW (ref 39.0–52.0)
Hemoglobin: 9.2 g/dL — ABNORMAL LOW (ref 13.0–17.0)
Hemoglobin: 9.2 g/dL — ABNORMAL LOW (ref 13.0–17.0)
Hemoglobin: 9.2 g/dL — ABNORMAL LOW (ref 13.0–17.0)
Hemoglobin: 9.5 g/dL — ABNORMAL LOW (ref 13.0–17.0)
Hemoglobin: 9.5 g/dL — ABNORMAL LOW (ref 13.0–17.0)
Hemoglobin: 9.5 g/dL — ABNORMAL LOW (ref 13.0–17.0)
O2 Saturation: 85 %
O2 Saturation: 90 %
O2 Saturation: 90 %
O2 Saturation: 91 %
O2 Saturation: 92 %
O2 Saturation: 99 %
Patient temperature: 36.7
Patient temperature: 36.8
Patient temperature: 36.9
Patient temperature: 98.7
Patient temperature: 98.7
Patient temperature: 99.1
Potassium: 3.5 mmol/L (ref 3.5–5.1)
Potassium: 3.7 mmol/L (ref 3.5–5.1)
Potassium: 3.7 mmol/L (ref 3.5–5.1)
Potassium: 3.9 mmol/L (ref 3.5–5.1)
Potassium: 4 mmol/L (ref 3.5–5.1)
Potassium: 4.1 mmol/L (ref 3.5–5.1)
Sodium: 156 mmol/L — ABNORMAL HIGH (ref 135–145)
Sodium: 156 mmol/L — ABNORMAL HIGH (ref 135–145)
Sodium: 156 mmol/L — ABNORMAL HIGH (ref 135–145)
Sodium: 157 mmol/L — ABNORMAL HIGH (ref 135–145)
Sodium: 157 mmol/L — ABNORMAL HIGH (ref 135–145)
Sodium: 157 mmol/L — ABNORMAL HIGH (ref 135–145)
TCO2: 30 mmol/L (ref 22–32)
TCO2: 30 mmol/L (ref 22–32)
TCO2: 31 mmol/L (ref 22–32)
TCO2: 31 mmol/L (ref 22–32)
TCO2: 31 mmol/L (ref 22–32)
TCO2: 33 mmol/L — ABNORMAL HIGH (ref 22–32)
pCO2 arterial: 47.5 mmHg (ref 32.0–48.0)
pCO2 arterial: 47.7 mmHg (ref 32.0–48.0)
pCO2 arterial: 48.8 mmHg — ABNORMAL HIGH (ref 32.0–48.0)
pCO2 arterial: 50.2 mmHg — ABNORMAL HIGH (ref 32.0–48.0)
pCO2 arterial: 51.6 mmHg — ABNORMAL HIGH (ref 32.0–48.0)
pCO2 arterial: 56.1 mmHg — ABNORMAL HIGH (ref 32.0–48.0)
pH, Arterial: 7.354 (ref 7.350–7.450)
pH, Arterial: 7.364 (ref 7.350–7.450)
pH, Arterial: 7.37 (ref 7.350–7.450)
pH, Arterial: 7.374 (ref 7.350–7.450)
pH, Arterial: 7.382 (ref 7.350–7.450)
pH, Arterial: 7.395 (ref 7.350–7.450)
pO2, Arterial: 158 mmHg — ABNORMAL HIGH (ref 83.0–108.0)
pO2, Arterial: 52 mmHg — ABNORMAL LOW (ref 83.0–108.0)
pO2, Arterial: 62 mmHg — ABNORMAL LOW (ref 83.0–108.0)
pO2, Arterial: 62 mmHg — ABNORMAL LOW (ref 83.0–108.0)
pO2, Arterial: 63 mmHg — ABNORMAL LOW (ref 83.0–108.0)
pO2, Arterial: 66 mmHg — ABNORMAL LOW (ref 83.0–108.0)

## 2019-09-15 LAB — CBC
HCT: 32.1 % — ABNORMAL LOW (ref 39.0–52.0)
HCT: 32.1 % — ABNORMAL LOW (ref 39.0–52.0)
Hemoglobin: 9.3 g/dL — ABNORMAL LOW (ref 13.0–17.0)
Hemoglobin: 9 g/dL — ABNORMAL LOW (ref 13.0–17.0)
MCH: 28.2 pg (ref 26.0–34.0)
MCH: 27.5 pg (ref 26.0–34.0)
MCHC: 29 g/dL — ABNORMAL LOW (ref 30.0–36.0)
MCHC: 28 g/dL — ABNORMAL LOW (ref 30.0–36.0)
MCV: 97.3 fL (ref 80.0–100.0)
MCV: 98.2 fL (ref 80.0–100.0)
Platelets: 126 10*3/uL — ABNORMAL LOW (ref 150–400)
Platelets: 134 10*3/uL — ABNORMAL LOW (ref 150–400)
RBC: 3.3 MIL/uL — ABNORMAL LOW (ref 4.22–5.81)
RBC: 3.27 MIL/uL — ABNORMAL LOW (ref 4.22–5.81)
RDW: 19 % — ABNORMAL HIGH (ref 11.5–15.5)
RDW: 18.9 % — ABNORMAL HIGH (ref 11.5–15.5)
WBC: 10.3 10*3/uL (ref 4.0–10.5)
WBC: 9.6 10*3/uL (ref 4.0–10.5)
nRBC: 0 % (ref 0.0–0.2)
nRBC: 0 % (ref 0.0–0.2)

## 2019-09-15 LAB — HEPARIN LEVEL (UNFRACTIONATED)
Heparin Unfractionated: 0.25 IU/mL — ABNORMAL LOW (ref 0.30–0.70)
Heparin Unfractionated: 0.34 IU/mL (ref 0.30–0.70)

## 2019-09-15 LAB — APTT
aPTT: 99 seconds — ABNORMAL HIGH (ref 24–36)
aPTT: 91 seconds — ABNORMAL HIGH (ref 24–36)

## 2019-09-15 LAB — BASIC METABOLIC PANEL
Anion gap: 10 (ref 5–15)
Anion gap: 6 (ref 5–15)
BUN: 93 mg/dL — ABNORMAL HIGH (ref 6–20)
BUN: 92 mg/dL — ABNORMAL HIGH (ref 6–20)
CO2: 28 mmol/L (ref 22–32)
CO2: 28 mmol/L (ref 22–32)
Calcium: 9.2 mg/dL (ref 8.9–10.3)
Calcium: 9.1 mg/dL (ref 8.9–10.3)
Chloride: 117 mmol/L — ABNORMAL HIGH (ref 98–111)
Chloride: 122 mmol/L — ABNORMAL HIGH (ref 98–111)
Creatinine, Ser: 1.2 mg/dL (ref 0.61–1.24)
Creatinine, Ser: 1.11 mg/dL (ref 0.61–1.24)
GFR calc Af Amer: >60 mL/min (ref >60)
GFR calc Af Amer: >60 mL/min (ref >60)
GFR calc non Af Amer: >60 mL/min (ref >60)
GFR calc non Af Amer: >60 mL/min (ref >60)
Glucose, Bld: 147 mg/dL — ABNORMAL HIGH (ref 70–99)
Glucose, Bld: 140 mg/dL — ABNORMAL HIGH (ref 70–99)
Potassium: 3.7 mmol/L (ref 3.5–5.1)
Potassium: 4 mmol/L (ref 3.5–5.1)
Sodium: 155 mmol/L — ABNORMAL HIGH (ref 135–145)
Sodium: 156 mmol/L — ABNORMAL HIGH (ref 135–145)

## 2019-09-15 LAB — FIBRINOGEN: Fibrinogen: 722 mg/dL — ABNORMAL HIGH (ref 210–475)

## 2019-09-15 LAB — CULTURE, BLOOD (ROUTINE X 2)
Culture: NO GROWTH
Culture: NO GROWTH

## 2019-09-15 LAB — GLUCOSE, CAPILLARY
Glucose-Capillary: 135 mg/dL — ABNORMAL HIGH (ref 70–99)
Glucose-Capillary: 134 mg/dL — ABNORMAL HIGH (ref 70–99)
Glucose-Capillary: 114 mg/dL — ABNORMAL HIGH (ref 70–99)
Glucose-Capillary: 128 mg/dL — ABNORMAL HIGH (ref 70–99)
Glucose-Capillary: 126 mg/dL — ABNORMAL HIGH (ref 70–99)

## 2019-09-15 LAB — LACTIC ACID, PLASMA: Lactic Acid, Venous: 0.9 mmol/L (ref 0.5–1.9)

## 2019-09-15 LAB — COOXEMETRY PANEL
Carboxyhemoglobin: 3.2 % — ABNORMAL HIGH (ref 0.5–1.5)
Methemoglobin: 1.2 % (ref 0.0–1.5)
O2 Saturation: 70.3 %
Total hemoglobin: 8.7 g/dL — ABNORMAL LOW (ref 12.0–16.0)

## 2019-09-15 LAB — LACTATE DEHYDROGENASE: LDH: 310 U/L — ABNORMAL HIGH (ref 98–192)

## 2019-09-15 MED ORDER — SODIUM CHLORIDE 0.45 % IV SOLN: INTRAVENOUS | Status: DC

## 2019-09-15 MED ORDER — POTASSIUM CHLORIDE 20 MEQ PO PACK
40.0000 meq | PACK | Freq: Once | ORAL | Status: AC
  Administered 2019-09-15: 40 meq
  Filled 2019-09-15: qty 2

## 2019-09-15 MED ORDER — FUROSEMIDE 10 MG/ML IJ SOLN
60.0000 mg | Freq: Once | INTRAMUSCULAR | Status: AC
  Administered 2019-09-15: 10:00:00 60 mg via INTRAVENOUS
  Filled 2019-09-15: qty 6

## 2019-09-15 MED ORDER — POTASSIUM CHLORIDE 20 MEQ/15ML (10%) PO SOLN
40.0000 meq | Freq: Once | ORAL | Status: AC
  Administered 2019-09-15: 06:00:00 40 meq
  Filled 2019-09-15: qty 30

## 2019-09-15 NOTE — Progress Notes (Signed)
ANTICOAGULATION CONSULT NOTE - Follow Up Consult  Pharmacy Consult for heparin Indication: ECMO  Labs: Recent Labs    09/14/19 0408 09/14/19 0418 09/14/19 1620 09/14/19 1635 09/15/19 0434 09/15/19 0434 09/15/19 0435 09/15/19 0444  HGB 8.3*   < > 9.5*   < > 9.3*   < > 9.2* 9.2*  HCT 29.0*   < > 32.0*   < > 32.1*  --  27.0* 27.0*  PLT 132*  --  137*  --  126*  --   --   --   APTT 89*  --  77*  --  99*  --   --   --   HEPARINUNFRC 0.26*  --  0.27*  --  0.25*  --   --   --   CREATININE 1.21  --  1.17  --  1.20  --   --   --    < > = values in this interval not displayed.    Assessment: 44yo male on ECMO heparin drip 1900 units/hr HL still low 0.25.  No overt bleeding - still some bleeding from nose and clot in back of throat  - improved. Nasal packing removed 1/29 CBC stable. LDH stable.  Goal of Therapy:  Heparin level 0.3-0.5 units/ml (aiming for lowest end)   Plan:  Increase heparin to 2000 uts/hr  Check heparin  q12h  Monitor s/s bleeding  Vertis Kelch, PharmD, [facility] PGY2 Cardiology Pharmacy Resident Phone [phone] 09/15/2019       7:36 AM  Please check [email or web address] for unit-specific pharmacist phone numbers

## 2019-09-15 NOTE — CV Procedure (Signed)
   ECMO NOTE:  Indication: Respiratory failure due to COVID PNA  Initial cannulation date: 08/03/2019  ECMO type: VV ECMO (Cardio-Help)  Dual lumen  Inflow/return cannula:  1) 32 FR Crescent placed 2/2  ECMO events:  Initial cannulation 12/26 Circuit changed and trach exchanged on 1/8.   Underwent addition of a RIJ drainage cannula on 1/20 Circuit changed on 1/22 for rising LDH Had evidence of recirculation and RIJ cannula pulled back on 1/26 19 FR RIJ removed 09/06/19 Underwent change to [facility] catheter with removal of femoral lines 2/2  Daily data:  Remains on VV ecmo with good flows.   Flow 4.84 RPM 3550 dP 31 Pven  -86 Sweep 8 -> 9  SVO2 70%  Labs:  Hgb 8.3 -> 9.5 LDH 450 -> 481 -> 508 -> 557 -> 555 -> 469 -> 367 -> 320  -> 302 -> 310 Heparin level: 0.25  Plan:  Crescent cannula working well Excellent flow and sats CXR improving slowly  Continue to wean in 4 hour bursts then allow to rest with sedation. Worn out today after long day yesterday. Will sedate this am and let him rest a bit  One dose IV lasix Watch DTIs   Glori Bickers, MD  8:34 AM

## 2019-09-15 NOTE — Progress Notes (Signed)
NAME:  [name], MRN:  [id number], DOB:  08-14-1975, LOS: 76 ADMISSION DATE:  07/22/2019, CONSULTATION DATE:  12/24 REFERRING MD:  Sloan Leiter, CHIEF COMPLAINT:  Dyspnea   Brief History   44 y/o M admitted 12/14 with COVID pneumonia causing acute hypoxemic respiratory failure. Developed pneumomediastinum 12/23 with concern for possible bilateral small pneumothoraces on 12/24.  PCCM consulted for evaluation of pneumothoraces  Past Medical History  GERD HTN Asthma  Significant Hospital Events   12/14 admit with hypoxemic respiratory failure in setting of COVID-19 pneumonia 12/23 noted to have pneumomediastinum on chest x-ray 12/24 PCCM consulted for evaluation 12/26 worsening hypoxemia, intubated  12/27 VV fem/fem ECMO cannulation See bedside nursing event notes for full rundown of procedures after 12/27 1/30 improved cxr, improved lung compliance, TV 350s on 15PC  1/31 CXR increased infiltrates, +CFB     2/2 Converted to right IJ Crescent cannula. 2/3 PEG tube inserted at bedside  Consults:  PCCM, CTS, CHF, Palliative   Significant Diagnostic Tests:   CT chest 12/22 >> extensive pneumomediastinum, multifocal patchy bilateral groundglass opacities CXR 1/25 >> improving bilateral infiltrates. ECMO cannulae in place. CXR 2/5 >> clearing bilateral infiltrates  Micro Data:  BCx2 12/14 >> negative  Sputum 12/15 >> negative  1/9 bronch: serratia and MSSA 1/2 acid fast smear bronch: negative 1/2 fungal cx bronch: pending 1/12 BAL: serratia 1/18 BAL >> rare Serratia. 2/1 BAL >> rare Serratia - few WBC  Antimicrobials:  Received remdesivir, steroids, actemra and convalescent plasma  5 days cefepime 12/24-12/29 Cefepime 1/4->1/14 vanc 1/4->1/14 Ceftriaxone 1/14 >>1/27 Cefazolin 1/28>> 02/2   Interim history/subjective:   Required increase in sedation for increased ventilator dyssynchrony this morning.  Patient had been left in the sitting position for prolonged period  time yesterday as well as yesterday evening. Objective   Blood pressure (!) 167/52, pulse 85, temperature 98.4 F (36.9 C), temperature source Core, resp. rate (!) 26, height _0  (1.727 m), weight (!) 141.3 kg, SpO2 96 %. CVP:  [10 mmHg-17 mmHg] 12 mmHg  Vent Mode: PCV FiO2 (%):  [40 %] 40 % Set Rate:  [15 bmp] 15 bmp PEEP:  [16 cmH20] 16 cmH20 Pressure Support:  [16 cmH20] 16 cmH20 Plateau Pressure:  [29 cmH20-30 cmH20] 29 cmH20   Intake/Output Summary (Last 24 hours) at 09/15/2019 1206 Last data filed at 09/15/2019 0900 Gross per 24 hour  Intake 4548.92 ml  Output 2130 ml  Net 2418.92 ml   Filed Weights   09/14/19 0500 09/15/19 0141 09/15/19 0500  Weight: (!) 140.4 kg (!) 141.3 kg (!) 141.3 kg   Examination GEN: ill obese man on vent HEENT: trach in place, no obvious oral or nasal bleeding. CV: distant, ext warm PULM: improved vesicular breath sounds anteriorly, no wheezing. Bronchial breath sounds have receded to mid-axillary line.  Breath stacking has improved. GI: Soft, +BS EXT: diffuse anasarca improved NEURO: opens eyes to voice, occasional coughing on vent PSYCH: deferred SKIN: slightly pale.  Areas of skin breakdown from support devices.  Pressure Injury 09/08/19 Cervical Medial Stage 3 -  Full thickness tissue loss. Subcutaneous fat may be visible but bone, tendon or muscle are NOT exposed. quarter size, snoozing (Active)  09/08/19 1600  Location: Cervical  Location Orientation: Medial  Staging: Stage 3 -  Full thickness tissue loss. Subcutaneous fat may be visible but bone, tendon or muscle are NOT exposed.  Wound Description (Comments): quarter size, snoozing  Present on Admission: No     Pressure Injury 09/12/19 Heel  Left;Posterior;Lateral Deep Tissue Pressure Injury - Purple or maroon localized area of discolored intact skin or blood-filled blister due to damage of underlying soft tissue from pressure and/or shear. deep purple (Active)  09/12/19 0430  Location:  Heel  Location Orientation: Left;Posterior;Lateral  Staging: Deep Tissue Pressure Injury - Purple or maroon localized area of discolored intact skin or blood-filled blister due to damage of underlying soft tissue from pressure and/or shear.  Wound Description (Comments): deep purple  Present on Admission: No     Pressure Injury 09/12/19 Foot Right;Posterior;Lateral Deep Tissue Pressure Injury - Purple or maroon localized area of discolored intact skin or blood-filled blister due to damage of underlying soft tissue from pressure and/or shear. deep purple (Active)  09/12/19 0430  Location: Foot  Location Orientation: Right;Posterior;Lateral  Staging: Deep Tissue Pressure Injury - Purple or maroon localized area of discolored intact skin or blood-filled blister due to damage of underlying soft tissue from pressure and/or shear.  Wound Description (Comments): deep purple  Present on Admission: No    CT head 1/28: pansinusitis. CT chest 1/28: Dense bilateral consolidation in dependent lungs both sides CT abdomen 1/28: Cannulae in position in IVC.  Flow (LPM): 4.89 ECMO Mode: VV ECMO Device: Cardiohelp  $ Ventilator Initial/Subsequent : Subsequent, Vent Mode: PCV, Set Rate: 15 bmp, FiO2 (%): 40 %, I Time: 0.9 Sec(s), PEEP: 16 cmH20 Vt today 368m  Net IO Since Admission: 446,50372 mL [09/15/19 1206]  Resolved Hospital Problem list   MSSA and serratia marcescens pneumonia  Epistaxis Atrial fibrillation Assessment & Plan:   Acute hypoxemic respiratory failure with ARDS secondary to COVID-19, status post tracheostomy on mechanical ventilation and VV ECMO support.  Slowly resolving ARDS. Limited respiratory reserve with persistently high burden of disease, in keeping with natural history of COVID related ARDS.  Generally easier flow following cannula change. - No Bronch today - Continue lung protective ventilation.  -Diuresis today to maintain even fluid balance. - Allow periods of more  spontaneous breathing alternating with rest.  Allow for uninterrupted sleep at nighttime  Hypernatremia - increased free water  Daily Goals Checklist   Pain/Anxiety/Delirium protocol (if indicated): keep infusions off. Continue enteral agents. Dexmedetomidine and prn Versed and Dilaudid VAP protocol (if indicated): Bundle in place. Respiratory support goals: Pressure control ventilation with ultra protective lung ventilation. Titrate FiO2 down again. Continue PEEP 16  Blood pressure target:  MAP>65, off norepinephrine and IV amiodarone. DVT prophylaxis: Remains on systemic heparin - target Heparin level 0.3-0.7 Nutrition Status: Nutrition Problem: Increased nutrient needs Etiology: acute illness(COVID PNA) Signs/Symptoms: estimated needs Interventions: Tube feeding, Prostat Aim for lower caloric goal.  Resume feeds at lower rate.  Maintain protein intake.   GI prophylaxis: Protonix per tube. Fluid status goals: no diuresis today Urinary catheter: Guide hemodynamic management Glucose control: Euglycemic control with SSI Mobility/therapy needs: Bedrest.  Daily labs: as per ECMO protocol. Code Status: Full code  Family Communication:family updated by RN. Disposition: ICU  Labs   CBC: CBC Latest Ref Rng & Units 09/15/2019 09/15/2019 09/15/2019  WBC 4.0 - 10.5 K/uL - - -  Hemoglobin 13.0 - 17.0 g/dL 9.5(L) 9.2(L) 9.2(L)  Hematocrit 39.0 - 52.0 % 28.0(L) 27.0(L) 27.0(L)  Platelets 150 - 400 K/uL - - -    Basic Metabolic Panel: BMP Latest Ref Rng & Units 09/15/2019 09/15/2019 09/15/2019  Glucose 70 - 99 mg/dL - - -  BUN 6 - 20 mg/dL - - -  Creatinine 0.61 - 1.24 mg/dL - - -  Sodium 135 -  145 mmol/L 156(H) 156(H) 157(H)  Potassium 3.5 - 5.1 mmol/L 4.1 3.7 3.7  Chloride 98 - 111 mmol/L - - -  CO2 22 - 32 mmol/L - - -  Calcium 8.9 - 10.3 mg/dL - - -    GFR: Estimated Creatinine Clearance: 107.3 mL/min (by C-G formula based on SCr of 1.2 mg/dL). Recent Labs  Lab 09/12/19 0408  09/12/19 1633 09/13/19 0325 09/13/19 0325 09/13/19 1631 09/14/19 0408 09/14/19 1620 09/15/19 0434  WBC 15.2*   < > 10.8*   < > 11.2* 9.6 10.8* 10.3  LATICACIDVEN 1.1  --  1.0  --   --  0.9  --  0.9   < > = values in this interval not displayed.    Liver Function Tests: No results for input(s): AST, ALT, ALKPHOS, BILITOT, PROT, ALBUMIN in the last 168 hours. No results for input(s): LIPASE, AMYLASE in the last 168 hours. No results for input(s): AMMONIA in the last 168 hours.  ABG    Component Value Date/Time   PHART 7.382 09/15/2019 0747   PCO2ART 47.5 09/15/2019 0747   PO2ART 52.0 (L) 09/15/2019 0747   HCO3 28.2 (H) 09/15/2019 0747   TCO2 30 09/15/2019 0747   ACIDBASEDEF 2.0 08/28/2019 1519   O2SAT 85.0 09/15/2019 0747     Coagulation Profile: No results for input(s): INR, PROTIME in the last 168 hours.  Cardiac Enzymes: No results for input(s): CKTOTAL, CKMB, CKMBINDEX, TROPONINI in the last 168 hours.  HbA1C: Hgb A1c MFr Bld  Date/Time Value Ref Range Status  07/24/2019 04:50 AM 6.7 (H) 4.8 - 5.6 % Final    Comment:    (NOTE) Pre diabetes:          5.7%-6.4% Diabetes:              >6.4% Glycemic control for   <7.0% adults with diabetes   09/30/2017 05:36 AM 5.7 (H) 4.8 - 5.6 % Final    Comment:    (NOTE)         Prediabetes: 5.7 - 6.4         Diabetes: >6.4         Glycemic control for adults with diabetes: <7.0     CBG: Recent Labs  Lab 09/14/19 1127 09/14/19 1625 09/14/19 1950 09/14/19 2352 09/15/19 0433  GLUCAP 126* 122* 100* 146* 135*     CRITICAL CARE Performed by: Kipp Brood   Total critical care time: 40 minutes  Critical care time was exclusive of separately billable procedures and treating other patients.  Critical care was necessary to treat or prevent imminent or life-threatening deterioration.  Critical care was time spent personally by me on the following activities: development of treatment plan with patient and/or  surrogate as well as nursing, discussions with consultants, evaluation of patient's response to treatment, examination of patient, obtaining history from patient or surrogate, ordering and performing treatments and interventions, ordering and review of laboratory studies, ordering and review of radiographic studies, pulse oximetry, re-evaluation of patient's condition and participation in multidisciplinary rounds.  Kipp Brood, MD [facility] ICU Physician [place]  Pager: [phone] Mobile: [phone] After hours: [phone].

## 2019-09-15 NOTE — Progress Notes (Signed)
ANTICOAGULATION CONSULT NOTE - Follow Up Consult  Pharmacy Consult for heparin Indication: ECMO  Labs: Recent Labs    09/14/19 1620 09/14/19 1635 09/15/19 0434 09/15/19 0435 09/15/19 1231 09/15/19 1231 09/15/19 1621 09/15/19 1630  HGB 9.5*   < > 9.3*   < > 9.2*   < > 9.0* 9.5*  HCT 32.0*   < > 32.1*   < > 27.0*  --  32.1* 28.0*  PLT 137*  --  126*  --   --   --  134*  --   APTT 77*  --  99*  --   --   --  91*  --   HEPARINUNFRC 0.27*  --  0.25*  --   --   --  0.34  --   CREATININE 1.17  --  1.20  --   --   --  1.11  --    < > = values in this interval not displayed.    Assessment: 44yo male on ECMO heparin drip 1900 units/hr.   HL 0.34 therapeutic. No overt bleeding, Nasal packing removed 1/29 CBC stable. LDH stable.  Goal of Therapy:  Heparin level 0.3-0.5 units/ml (aiming for lowest end)   Plan:  Continue heparin to 2000 uts/hr  Check heparin  q12h  Monitor s/s bleeding  Benetta Spar, PharmD, BCPS, [facility] Clinical Pharmacist  Please check AMION for all [place] phone numbers After 10:00 PM, call [place] [phone]

## 2019-09-15 NOTE — Progress Notes (Signed)
Assisted tele visit to patient with family member.  Rin Gorton M, RN  

## 2019-09-15 NOTE — Progress Notes (Signed)
30 cc Dilaudid wasted with Kyung Rudd, RN

## 2019-09-15 NOTE — Progress Notes (Signed)
Patient ID: [name], male   DOB: September 23, 1975, 44 y.o.   MRN: [id number]    Advanced Heart Failure Rounding Note   Subjective:    Remains on VV ecmo with good flows. Waking up this am but not following commands for me.   Had a good day yesterday without much sedation. Worked hard. This am fatigued. Seems dysynchronous with the vent. Sats dropping into 80s.   CVP 12 Sweep 8 -> 9  ABG 7.38/47/52/85%  Co-ox 80%   On vent now at 40% on PC.TVs ~250-300cc Personally reviewed  CXR continues to improve. Crescent catheter well positioned  Personally reviewed   hgb 9.5 wbc 18.2 -> 15.2 -> 10.8 -> 9.6 -> 9.3. Sodium 153-> 154 -> 155  ECMO parameters - see separate ecmo note   Objective:   Weight Range:  Vital Signs:   Temp:  [98.2 F (36.8 C)-99.1 F (37.3 C)] 98.4 F (36.9 C) (02/07 0800) Pulse Rate:  [68-91] 76 (02/07 1000) Resp:  [16-32] 16 (02/07 1000) BP: (125-177)/(43-52) 167/52 (02/07 0545) SpO2:  [91 %-100 %] 95 % (02/07 1000) Arterial Line BP: (101-176)/(36-65) 101/38 (02/07 1000) FiO2 (%):  [40 %] 40 % (02/07 0758) Weight:  [141.3 kg] 141.3 kg (02/07 0500) Last BM Date: (constant via flexiseal)  Weight change: Filed Weights   09/14/19 0500 09/15/19 0141 09/15/19 0500  Weight: (!) 140.4 kg (!) 141.3 kg (!) 141.3 kg    Intake/Output:   Intake/Output Summary (Last 24 hours) at 09/15/2019 1027 Last data filed at 09/15/2019 0900 Gross per 24 hour  Intake 5176.55 ml  Output 2835 ml  Net 2341.55 ml     Physical Exam: General:  Awake on vent. Appears fatigued and dyssynchronous with ventdifficulty HEENT: normal Neck: supple. RIJ cannula ok Carotids 2+ bilat; no bruits. No lymphadenopathy or thryomegaly appreciated. Cor: PMI nondisplaced. Regular rate & rhythm. No rubs, gallops or murmurs. Lungs: improved air movement  Abdomen: obese soft, nontender, nondistended. No hepatosplenomegaly. No bruits or masses. Good bowel sounds. Extremities: no cyanosis,  clubbing, rash, 2-3+ edema + DTIs Neuro: awake on vent but not following commands    Telemetry: Sinus 70-80s  Personally reviewed  Labs: Basic Metabolic Panel: Recent Labs  Lab 09/11/19 0228 09/11/19 0458 09/13/19 0325 09/13/19 1144 09/13/19 1631 09/13/19 1640 09/14/19 0408 09/14/19 0418 09/14/19 1620 09/14/19 1635 09/14/19 2355 09/15/19 0434 09/15/19 0435 09/15/19 0444 09/15/19 0747  NA  --    < > 153*   < > 152*   < > 154*   < > 155*   < > 156* 155* 157* 156* 156*  K  --    < > 4.4   < > 4.3   < > 3.9   < > 3.4*   < > 3.5 3.7 3.7 3.7 4.1  CL  --    < > 114*  --  114*  --  115*  --  117*  --   --  117*  --   --   --   CO2  --    < > 28  --  27  --  28  --  27  --   --  28  --   --   --   GLUCOSE  --    < > 132*  --  148*  --  117*  --  144*  --   --  147*  --   --   --   BUN  --    < >  103*  --  97*  --  95*  --  91*  --   --  93*  --   --   --   CREATININE  --    < > 1.38*  --  1.40*  --  1.21  --  1.17  --   --  1.20  --   --   --   CALCIUM  --    < > 9.4  --  9.5   < > 9.4  --  9.4  --   --  9.2  --   --   --   MG 2.5*  --   --   --   --   --   --   --   --   --   --   --   --   --   --    < > = values in this interval not displayed.    Liver Function Tests: No results for input(s): AST, ALT, ALKPHOS, BILITOT, PROT, ALBUMIN in the last 168 hours. No results for input(s): LIPASE, AMYLASE in the last 168 hours. No results for input(s): AMMONIA in the last 168 hours.  CBC: Recent Labs  Lab 09/13/19 0325 09/13/19 1144 09/13/19 1631 09/13/19 1640 09/14/19 0408 09/14/19 0418 09/14/19 1620 09/14/19 1635 09/14/19 2355 09/15/19 0434 09/15/19 0435 09/15/19 0444 09/15/19 0747  WBC 10.8*  --  11.2*  --  9.6  --  10.8*  --   --  10.3  --   --   --   HGB 8.4*   < > 8.9*   < > 8.3*   < > 9.5*   < > 9.5* 9.3* 9.2* 9.2* 9.5*  HCT 28.1*   < > 30.4*   < > 29.0*   < > 32.0*   < > 28.0* 32.1* 27.0* 27.0* 28.0*  MCV 95.9  --  96.5  --  97.6  --  95.2  --   --  97.3  --    --   --   PLT 123*  --  144*  --  132*  --  137*  --   --  126*  --   --   --    < > = values in this interval not displayed.    Cardiac Enzymes: No results for input(s): CKTOTAL, CKMB, CKMBINDEX, TROPONINI in the last 168 hours.  BNP: BNP (last 3 results) Recent Labs    07/22/19 1039  BNP 15.3    ProBNP (last 3 results) No results for input(s): PROBNP in the last 8760 hours.    Other results:  Imaging: DG CHEST PORT 1 VIEW  Result Date: 09/15/2019 CLINICAL DATA:  44 year old male with respiratory failure due to COVID-19 pneumonia. ECMO. EXAM: PORTABLE CHEST 1 VIEW COMPARISON:  Portable chest 09/14/2019 and earlier. FINDINGS: Portable AP semi upright view at 0417 hours. Stable tracheostomy. Stable bilateral chest tubes. Large caliber vascular catheter again projects vertically along the right chest near midline. Stable lung volumes and ventilation with residual widespread coarse pulmonary interstitial opacity. No pneumothorax identified. Small if any pleural effusions. Stable cardiac size and mediastinal contours. Paucity of bowel gas in the upper abdomen. IMPRESSION: 1. Stable lines and tubes. 2. Stable lung volumes and ventilation with widespread residual coarse interstitial opacity. 3. No pneumothorax identified. Electronically Signed   By: Genevie Ann M.D.   On: 09/15/2019 06:56   DG CHEST PORT 1 VIEW  Result Date: 09/14/2019 CLINICAL DATA:  Pulmonary  failure. COVID pneumonia. EXAM: PORTABLE CHEST 1 VIEW COMPARISON:  09/13/2019 and prior studies FINDINGS: Cardiomegaly and diffuse bilateral airspace opacities again noted. Bilateral thoracostomy tubes, tracheostomy tube and RIGHT IJ ECMO cannula again noted. There is no evidence of pneumothorax large pleural effusion. There has been little interval change since the prior study. IMPRESSION: Unchanged appearance of the chest with diffuse bilateral airspace opacities. Electronically Signed   By: Margarette Canada M.D.   On: 09/14/2019 09:50      Medications:     Scheduled Medications: . sodium chloride   Intravenous Once  . amiodarone  200 mg Oral Daily  . vitamin C  500 mg Per Tube Daily  . chlorhexidine gluconate (MEDLINE KIT)  15 mL Mouth Rinse BID  . Chlorhexidine Gluconate Cloth  6 each Topical Daily  . diazepam  20 mg Per Tube Q6H  . feeding supplement (PRO-STAT SUGAR FREE 64)  60 mL Oral 5 X Daily  . free water  500 mL Per Tube Q6H  . gabapentin  200 mg Per Tube Q8H  . hydrALAZINE  10 mg Per Tube Q8H  . HYDROmorphone  16 mg Per Tube Q4H  . insulin aspart  0-20 Units Subcutaneous Q4H  . ipratropium-albuterol  3 mL Nebulization Q4H  . mouth rinse  15 mL Mouth Rinse 10 times per day  . montelukast  10 mg Per Tube QHS  . oxymetazoline  1 spray Each Nare BID  . pantoprazole sodium  40 mg Per Tube QHS  . propranolol  20 mg Per Tube TID  . QUEtiapine  50 mg Per Tube BID  . sodium chloride flush  10-40 mL Intracatheter Q12H  . Thrombi-Pad  1 each Topical Once  . Thrombi-Pad  1 each Topical Once  . white petrolatum   Topical BID  . zinc sulfate  220 mg Per Tube Daily    Infusions: . sodium chloride    . sodium chloride Stopped (09/05/19 1900)  . sodium chloride 10 mL/hr at 09/11/19 2156  . albumin human 12.5 g (09/13/19 0049)  . dexmedetomidine (PRECEDEX) IV infusion 1.1 mcg/kg/hr (09/15/19 0901)  . feeding supplement (PIVOT 1.5 CAL) 1,000 mL (09/14/19 1727)  . heparin 2,000 Units/hr (09/15/19 0900)  . HYDROmorphone 5 mg/hr (09/15/19 0900)  . midazolam 4 mg/hr (09/15/19 0900)  . norepinephrine Stopped (09/15/19 0400)    PRN Medications: Place/Maintain arterial line **AND** sodium chloride, sodium chloride, sodium chloride, acetaminophen (TYLENOL) oral liquid 160 mg/5 mL, acetaminophen, albumin human, hydrALAZINE, HYDROmorphone, magic mouthwash, magnesium hydroxide, midazolam, ondansetron (ZOFRAN) IV, senna-docusate, sodium chloride   Assessment/Plan:    1. Acute hypoxic/hypercarbic respiratory failure  due to COVID PNA: Remained markedly hypoxic despite full support. Intubated 12/26. S/p bilateral CTs 12/26 for pneumothoraces.  TEE on 12/26 LVEF 70% RV ok. VV ECMO begun 12/26. Tracheostomy 12/29.  COVID ARDS at this point. ECMO circuit changed 1/8 & 1/23. Crescent cannula placed 2/2 (femoral cannuals removed) - ECMO parameters as per ECMO note - Flows look  good on Crescent. Sats and CXR continue to improve - LDH 302 -> 310 - Continue heparin. No bleeding. Discussed dosing with PharmD personally. - Off lasix gtt due to azotemia.  He is 3rd spacing. Lasix 60 IV today - Bronch by CCM 2/4 ok   - Continue chest PT - Continue 4 hour episodes of awakening and letting him work interspersed with sedation and rest periods. He is now very fatigued. Will sedate and let him rest for a bit.   2. Bilateral PTX:  Due to barotrauma.  - Driving pressure minimized at < 15 Stable. (12 today)  No change  3. COVID PNA: management of COVID infection per CCM. CXR with bilateral multifocal PNA progressing to likely ARDS.  - s/p 10 days of remdesivir - 12/16, 12/2 s/p tociluzimab - 12/17 s/p convalescent plasma - Decadron at 4m/daily completed 1/11 - Completed cefepime/vanc - Off Ancef  4. ID: Empiric coverage with vancomycin/cefepime initially for possible secondary bacterial PNA, course completed.   - treated with cefepime and vanc for + cx with staph epidimidis, MSSA and serratia (sensitivities ok). - Currently off abx - BAL cx. With serratia and enterococcus felt to be colonization - Watch cx. Low threshold to add back abx. - WBC 18.2 ->15.2 -> 10.8 -> 9.6 -> 10.3  5. Thrombocytopenia: Slow decrease in platelets, likely low level hemolysis + critical illness.  - resolved. Platelets 126  today Follow  6. Anemia: Bleeding at trach site resolved but still with epistaxis - hgb 9.5 today  7. FEN - now with G-tube. Resume TF  8. Hypernatremia - NA 155-> 152 -> 151-> 152. -> 153 -> 154 -> 155 - FW  increased  9. AKI - lasix on hold. Slightly improved - he is 3rd spacing. Continue unna boots. One dose lasix today  10. DTIs - WOC following   D/w with ECMO team (ECMO coordinator/specialists, CCM, TCTS and pharmD) at bedside on multidisciplinary rounds. Continue to await ARDS recovery. Details as above.   Length of Stay: 536 CRITICAL CARE Performed by: BGlori Bickers Total critical care time: 45  minutes  Critical care time was exclusive of separately billable procedures and treating other patients.  Critical care was necessary to treat or prevent imminent or life-threatening deterioration.  Critical care was time spent personally by me (independent of midlevel providers or residents) on the following activities: development of treatment plan with patient and/or surrogate as well as nursing, discussions with consultants, evaluation of patient's response to treatment, examination of patient, obtaining history from patient or surrogate, ordering and performing treatments and interventions, ordering and review of laboratory studies, ordering and review of radiographic studies, pulse oximetry and re-evaluation of patient's condition.    DGlori BickersMD 09/15/2019, 10:27 AM  Advanced Heart Failure Team Pager [phone](M-F; 7a - 4p)  Please contact [email or web address] for night-coverage after hours (4p -7a ) and weekends on [email or web address]

## 2019-09-16 ENCOUNTER — Inpatient Hospital Stay (HOSPITAL_COMMUNITY): Payer: Managed Care, Other (non HMO)

## 2019-09-16 LAB — POCT I-STAT 7, (LYTES, BLD GAS, ICA,H+H)
Acid-Base Excess: 1 mmol/L (ref 0.0–2.0)
Acid-Base Excess: 1 mmol/L (ref 0.0–2.0)
Acid-Base Excess: 1 mmol/L (ref 0.0–2.0)
Acid-Base Excess: 2 mmol/L (ref 0.0–2.0)
Acid-Base Excess: 2 mmol/L (ref 0.0–2.0)
Acid-Base Excess: 2 mmol/L (ref 0.0–2.0)
Acid-Base Excess: 2 mmol/L (ref 0.0–2.0)
Acid-Base Excess: 4 mmol/L — ABNORMAL HIGH (ref 0.0–2.0)
Acid-base deficit: 4 mmol/L — ABNORMAL HIGH (ref 0.0–2.0)
Bicarbonate: 21.6 mmol/L (ref 20.0–28.0)
Bicarbonate: 26.2 mmol/L (ref 20.0–28.0)
Bicarbonate: 27.5 mmol/L (ref 20.0–28.0)
Bicarbonate: 27.8 mmol/L (ref 20.0–28.0)
Bicarbonate: 28.4 mmol/L — ABNORMAL HIGH (ref 20.0–28.0)
Bicarbonate: 28.5 mmol/L — ABNORMAL HIGH (ref 20.0–28.0)
Bicarbonate: 28.5 mmol/L — ABNORMAL HIGH (ref 20.0–28.0)
Bicarbonate: 29 mmol/L — ABNORMAL HIGH (ref 20.0–28.0)
Bicarbonate: 29.3 mmol/L — ABNORMAL HIGH (ref 20.0–28.0)
Calcium, Ion: 1.12 mmol/L — ABNORMAL LOW (ref 1.15–1.40)
Calcium, Ion: 1.31 mmol/L (ref 1.15–1.40)
Calcium, Ion: 1.31 mmol/L (ref 1.15–1.40)
Calcium, Ion: 1.31 mmol/L (ref 1.15–1.40)
Calcium, Ion: 1.32 mmol/L (ref 1.15–1.40)
Calcium, Ion: 1.33 mmol/L (ref 1.15–1.40)
Calcium, Ion: 1.33 mmol/L (ref 1.15–1.40)
Calcium, Ion: 1.34 mmol/L (ref 1.15–1.40)
Calcium, Ion: 1.34 mmol/L (ref 1.15–1.40)
HCT: 22 % — ABNORMAL LOW (ref 39.0–52.0)
HCT: 26 % — ABNORMAL LOW (ref 39.0–52.0)
HCT: 26 % — ABNORMAL LOW (ref 39.0–52.0)
HCT: 28 % — ABNORMAL LOW (ref 39.0–52.0)
HCT: 29 % — ABNORMAL LOW (ref 39.0–52.0)
HCT: 29 % — ABNORMAL LOW (ref 39.0–52.0)
HCT: 31 % — ABNORMAL LOW (ref 39.0–52.0)
HCT: 32 % — ABNORMAL LOW (ref 39.0–52.0)
HCT: 32 % — ABNORMAL LOW (ref 39.0–52.0)
Hemoglobin: 10.5 g/dL — ABNORMAL LOW (ref 13.0–17.0)
Hemoglobin: 10.9 g/dL — ABNORMAL LOW (ref 13.0–17.0)
Hemoglobin: 10.9 g/dL — ABNORMAL LOW (ref 13.0–17.0)
Hemoglobin: 7.5 g/dL — ABNORMAL LOW (ref 13.0–17.0)
Hemoglobin: 8.8 g/dL — ABNORMAL LOW (ref 13.0–17.0)
Hemoglobin: 8.8 g/dL — ABNORMAL LOW (ref 13.0–17.0)
Hemoglobin: 9.5 g/dL — ABNORMAL LOW (ref 13.0–17.0)
Hemoglobin: 9.9 g/dL — ABNORMAL LOW (ref 13.0–17.0)
Hemoglobin: 9.9 g/dL — ABNORMAL LOW (ref 13.0–17.0)
O2 Saturation: 80 %
O2 Saturation: 84 %
O2 Saturation: 86 %
O2 Saturation: 86 %
O2 Saturation: 87 %
O2 Saturation: 88 %
O2 Saturation: 88 %
O2 Saturation: 89 %
O2 Saturation: 94 %
Patient temperature: 36.8
Patient temperature: 36.8
Patient temperature: 36.9
Patient temperature: 36.9
Patient temperature: 36.9
Patient temperature: 37
Patient temperature: 37
Patient temperature: 98.1
Patient temperature: 99.2
Potassium: 3.4 mmol/L — ABNORMAL LOW (ref 3.5–5.1)
Potassium: 3.6 mmol/L (ref 3.5–5.1)
Potassium: 3.6 mmol/L (ref 3.5–5.1)
Potassium: 4.1 mmol/L (ref 3.5–5.1)
Potassium: 4.3 mmol/L (ref 3.5–5.1)
Potassium: 4.4 mmol/L (ref 3.5–5.1)
Potassium: 4.5 mmol/L (ref 3.5–5.1)
Potassium: 4.5 mmol/L (ref 3.5–5.1)
Potassium: 4.6 mmol/L (ref 3.5–5.1)
Sodium: 152 mmol/L — ABNORMAL HIGH (ref 135–145)
Sodium: 153 mmol/L — ABNORMAL HIGH (ref 135–145)
Sodium: 154 mmol/L — ABNORMAL HIGH (ref 135–145)
Sodium: 154 mmol/L — ABNORMAL HIGH (ref 135–145)
Sodium: 154 mmol/L — ABNORMAL HIGH (ref 135–145)
Sodium: 154 mmol/L — ABNORMAL HIGH (ref 135–145)
Sodium: 154 mmol/L — ABNORMAL HIGH (ref 135–145)
Sodium: 155 mmol/L — ABNORMAL HIGH (ref 135–145)
Sodium: 157 mmol/L — ABNORMAL HIGH (ref 135–145)
TCO2: 23 mmol/L (ref 22–32)
TCO2: 28 mmol/L (ref 22–32)
TCO2: 29 mmol/L (ref 22–32)
TCO2: 29 mmol/L (ref 22–32)
TCO2: 30 mmol/L (ref 22–32)
TCO2: 30 mmol/L (ref 22–32)
TCO2: 30 mmol/L (ref 22–32)
TCO2: 31 mmol/L (ref 22–32)
TCO2: 31 mmol/L (ref 22–32)
pCO2 arterial: 39.9 mmHg (ref 32.0–48.0)
pCO2 arterial: 43.6 mmHg (ref 32.0–48.0)
pCO2 arterial: 45.5 mmHg (ref 32.0–48.0)
pCO2 arterial: 48.9 mmHg — ABNORMAL HIGH (ref 32.0–48.0)
pCO2 arterial: 50.3 mmHg — ABNORMAL HIGH (ref 32.0–48.0)
pCO2 arterial: 50.8 mmHg — ABNORMAL HIGH (ref 32.0–48.0)
pCO2 arterial: 56.2 mmHg — ABNORMAL HIGH (ref 32.0–48.0)
pCO2 arterial: 56.4 mmHg — ABNORMAL HIGH (ref 32.0–48.0)
pCO2 arterial: 61.9 mmHg — ABNORMAL HIGH (ref 32.0–48.0)
pH, Arterial: 7.282 — ABNORMAL LOW (ref 7.350–7.450)
pH, Arterial: 7.309 — ABNORMAL LOW (ref 7.350–7.450)
pH, Arterial: 7.321 — ABNORMAL LOW (ref 7.350–7.450)
pH, Arterial: 7.343 — ABNORMAL LOW (ref 7.350–7.450)
pH, Arterial: 7.349 — ABNORMAL LOW (ref 7.350–7.450)
pH, Arterial: 7.356 (ref 7.350–7.450)
pH, Arterial: 7.357 (ref 7.350–7.450)
pH, Arterial: 7.367 (ref 7.350–7.450)
pH, Arterial: 7.423 (ref 7.350–7.450)
pO2, Arterial: 46 mmHg — ABNORMAL LOW (ref 83.0–108.0)
pO2, Arterial: 49 mmHg — ABNORMAL LOW (ref 83.0–108.0)
pO2, Arterial: 51 mmHg — ABNORMAL LOW (ref 83.0–108.0)
pO2, Arterial: 59 mmHg — ABNORMAL LOW (ref 83.0–108.0)
pO2, Arterial: 59 mmHg — ABNORMAL LOW (ref 83.0–108.0)
pO2, Arterial: 59 mmHg — ABNORMAL LOW (ref 83.0–108.0)
pO2, Arterial: 59 mmHg — ABNORMAL LOW (ref 83.0–108.0)
pO2, Arterial: 60 mmHg — ABNORMAL LOW (ref 83.0–108.0)
pO2, Arterial: 75 mmHg — ABNORMAL LOW (ref 83.0–108.0)

## 2019-09-16 LAB — TYPE AND SCREEN
ABO/RH(D): O POS
Antibody Screen: NEGATIVE
Unit division: 0
Unit division: 0
Unit division: 0
Unit division: 0
Unit division: 0
Unit division: 0

## 2019-09-16 LAB — BPAM RBC
Blood Product Expiration Date: 202102242359
Blood Product Expiration Date: 202102242359
Blood Product Expiration Date: 202102242359
Blood Product Expiration Date: 202102242359
Blood Product Expiration Date: 202102262359
Blood Product Expiration Date: 202102262359
ISSUE DATE / TIME: 202102011446
ISSUE DATE / TIME: 202102030915
ISSUE DATE / TIME: 202102030915
ISSUE DATE / TIME: 202102050108
ISSUE DATE / TIME: 202102060849
Unit Type and Rh: 5100
Unit Type and Rh: 5100
Unit Type and Rh: 5100
Unit Type and Rh: 5100
Unit Type and Rh: 5100
Unit Type and Rh: 5100

## 2019-09-16 LAB — GLUCOSE, CAPILLARY
Glucose-Capillary: 103 mg/dL — ABNORMAL HIGH (ref 70–99)
Glucose-Capillary: 107 mg/dL — ABNORMAL HIGH (ref 70–99)
Glucose-Capillary: 110 mg/dL — ABNORMAL HIGH (ref 70–99)
Glucose-Capillary: 110 mg/dL — ABNORMAL HIGH (ref 70–99)
Glucose-Capillary: 113 mg/dL — ABNORMAL HIGH (ref 70–99)
Glucose-Capillary: 120 mg/dL — ABNORMAL HIGH (ref 70–99)
Glucose-Capillary: 98 mg/dL (ref 70–99)

## 2019-09-16 LAB — BASIC METABOLIC PANEL
Anion gap: 10 (ref 5–15)
Anion gap: 13 (ref 5–15)
BUN: 100 mg/dL — ABNORMAL HIGH (ref 6–20)
BUN: 103 mg/dL — ABNORMAL HIGH (ref 6–20)
CO2: 27 mmol/L (ref 22–32)
CO2: 27 mmol/L (ref 22–32)
Calcium: 9.1 mg/dL (ref 8.9–10.3)
Calcium: 9.1 mg/dL (ref 8.9–10.3)
Chloride: 118 mmol/L — ABNORMAL HIGH (ref 98–111)
Chloride: 111 mmol/L (ref 98–111)
Creatinine, Ser: 1.14 mg/dL (ref 0.61–1.24)
Creatinine, Ser: 1.16 mg/dL (ref 0.61–1.24)
GFR calc Af Amer: >60 mL/min (ref >60)
GFR calc Af Amer: >60 mL/min (ref >60)
GFR calc non Af Amer: >60 mL/min (ref >60)
GFR calc non Af Amer: >60 mL/min (ref >60)
Glucose, Bld: 136 mg/dL — ABNORMAL HIGH (ref 70–99)
Glucose, Bld: 118 mg/dL — ABNORMAL HIGH (ref 70–99)
Potassium: 3.7 mmol/L (ref 3.5–5.1)
Potassium: 4.7 mmol/L (ref 3.5–5.1)
Sodium: 155 mmol/L — ABNORMAL HIGH (ref 135–145)
Sodium: 151 mmol/L — ABNORMAL HIGH (ref 135–145)

## 2019-09-16 LAB — CBC
HCT: 31.2 % — ABNORMAL LOW (ref 39.0–52.0)
HCT: 32.9 % — ABNORMAL LOW (ref 39.0–52.0)
Hemoglobin: 8.7 g/dL — ABNORMAL LOW (ref 13.0–17.0)
Hemoglobin: 9.3 g/dL — ABNORMAL LOW (ref 13.0–17.0)
MCH: 27.6 pg (ref 26.0–34.0)
MCH: 27.5 pg (ref 26.0–34.0)
MCHC: 27.9 g/dL — ABNORMAL LOW (ref 30.0–36.0)
MCHC: 28.3 g/dL — ABNORMAL LOW (ref 30.0–36.0)
MCV: 99 fL (ref 80.0–100.0)
MCV: 97.3 fL (ref 80.0–100.0)
Platelets: 138 10*3/uL — ABNORMAL LOW (ref 150–400)
Platelets: 139 10*3/uL — ABNORMAL LOW (ref 150–400)
RBC: 3.15 MIL/uL — ABNORMAL LOW (ref 4.22–5.81)
RBC: 3.38 MIL/uL — ABNORMAL LOW (ref 4.22–5.81)
RDW: 18.8 % — ABNORMAL HIGH (ref 11.5–15.5)
RDW: 18.4 % — ABNORMAL HIGH (ref 11.5–15.5)
WBC: 9.8 10*3/uL (ref 4.0–10.5)
WBC: 11.6 10*3/uL — ABNORMAL HIGH (ref 4.0–10.5)
nRBC: 0 % (ref 0.0–0.2)
nRBC: 0 % (ref 0.0–0.2)

## 2019-09-16 LAB — HEPARIN LEVEL (UNFRACTIONATED)
Heparin Unfractionated: 0.31 IU/mL (ref 0.30–0.70)
Heparin Unfractionated: 0.42 IU/mL (ref 0.30–0.70)

## 2019-09-16 LAB — APTT
aPTT: 115 seconds — ABNORMAL HIGH (ref 24–36)
aPTT: 102 seconds — ABNORMAL HIGH (ref 24–36)

## 2019-09-16 LAB — COOXEMETRY PANEL
Carboxyhemoglobin: 3.7 % — ABNORMAL HIGH (ref 0.5–1.5)
Methemoglobin: 0.5 % (ref 0.0–1.5)
O2 Saturation: 69.6 %
Total hemoglobin: 8.6 g/dL — ABNORMAL LOW (ref 12.0–16.0)

## 2019-09-16 LAB — FIBRINOGEN: Fibrinogen: 762 mg/dL — ABNORMAL HIGH (ref 210–475)

## 2019-09-16 LAB — LACTIC ACID, PLASMA: Lactic Acid, Venous: 1.1 mmol/L (ref 0.5–1.9)

## 2019-09-16 LAB — LACTATE DEHYDROGENASE: LDH: 289 U/L — ABNORMAL HIGH (ref 98–192)

## 2019-09-16 LAB — VITAMIN C: Vitamin C: 1.5 mg/dL (ref 0.4–2.0)

## 2019-09-16 MED ORDER — FUROSEMIDE 10 MG/ML IJ SOLN
5.0000 mg/h | INTRAVENOUS | Status: DC
  Administered 2019-09-16 – 2019-09-18 (×4): 8 mg/h via INTRAVENOUS
  Administered 2019-09-19: 12 mg/h via INTRAVENOUS
  Administered 2019-09-20 – 2019-09-21 (×2): 5 mg/h via INTRAVENOUS
  Filled 2019-09-16: qty 21
  Filled 2019-09-16: qty 25
  Filled 2019-09-16: qty 5
  Filled 2019-09-16 (×3): qty 25
  Filled 2019-09-16 (×2): qty 21

## 2019-09-16 MED ORDER — LIDOCAINE HCL (PF) 1 % IJ SOLN
5.0000 mL | Freq: Once | INTRAMUSCULAR | Status: AC
  Administered 2019-09-16: 5 mL
  Filled 2019-09-16: qty 5

## 2019-09-16 MED ORDER — PSYLLIUM 95 % PO PACK
1.0000 | PACK | Freq: Two times a day (BID) | ORAL | Status: DC
  Administered 2019-09-16 – 2019-10-05 (×38): 1 via ORAL
  Filled 2019-09-16 (×41): qty 1

## 2019-09-16 MED ORDER — HYDROMORPHONE BOLUS VIA INFUSION
2.0000 mg | Freq: Once | INTRAVENOUS | Status: AC
  Administered 2019-09-16: 2 mg via INTRAVENOUS
  Filled 2019-09-16: qty 2

## 2019-09-16 MED ORDER — FUROSEMIDE 10 MG/ML IJ SOLN
80.0000 mg | Freq: Once | INTRAMUSCULAR | Status: AC
  Administered 2019-09-16: 80 mg via INTRAVENOUS
  Filled 2019-09-16: qty 8

## 2019-09-16 MED ORDER — POTASSIUM CHLORIDE 20 MEQ/15ML (10%) PO SOLN
40.0000 meq | Freq: Once | ORAL | Status: AC
  Administered 2019-09-16: 40 meq via ORAL
  Filled 2019-09-16: qty 30

## 2019-09-16 MED ORDER — MIDAZOLAM HCL 2 MG/2ML IJ SOLN
INTRAMUSCULAR | Status: AC
  Administered 2019-09-16: 08:00:00 2 mg
  Filled 2019-09-16: qty 2

## 2019-09-16 MED ORDER — POTASSIUM CHLORIDE 20 MEQ/15ML (10%) PO SOLN
40.0000 meq | Freq: Once | ORAL | Status: AC
  Administered 2019-09-16: 40 meq
  Filled 2019-09-16: qty 30

## 2019-09-16 MED ORDER — LIDOCAINE HCL 1 % IJ SOLN
5.0000 mL | Freq: Once | INTRAMUSCULAR | Status: DC
  Filled 2019-09-16: qty 5

## 2019-09-16 MED ORDER — MIDAZOLAM BOLUS VIA INFUSION
2.0000 mg | Freq: Once | INTRAVENOUS | Status: DC
  Filled 2019-09-16: qty 2

## 2019-09-16 NOTE — Progress Notes (Signed)
5 Days Post-Op Procedure(s) (LRB): PEG TUBE INSERTION - BEDSIDE (N/A) ESOPHAGOGASTRODUODENOSCOPY (EGD) (N/A) Subjective: Not able to assess  Objective: Vital signs in last 24 hours: Temp:  [98.1 F (36.7 C)-98.7 F (37.1 C)] 98.2 F (36.8 C) (02/08 0735) Pulse Rate:  [64-91] 76 (02/08 0719) Cardiac Rhythm: Normal sinus rhythm (02/08 0800) Resp:  [0-27] 17 (02/08 0719) BP: (106)/(43) 106/43 (02/07 2100) SpO2:  [83 %-96 %] 94 % (02/08 0719) Arterial Line BP: (98-170)/(38-65) 153/50 (02/08 0600) FiO2 (%):  [40 %-60 %] 50 % (02/08 0800)  Hemodynamic parameters for last 24 hours: CVP:  [11 mmHg-29 mmHg] 22 mmHg  Intake/Output from previous day: 02/07 0701 - 02/08 0700 In: 4334.1 [I.V.:2794.1; NG/GT:920] Out: 3500 [Urine:3300; Stool:175; Chest Tube:50] Intake/Output this shift: Total I/O In: 2405 [I.V.:325; NG/GT:2080] Out: 1673 [Urine:150; Stool:1503; Chest Tube:20]  General appearance: sedated Neurologic: unable to fully assess due to sedation Heart: regular rate and rhythm, S1, S2 normal, no murmur, click, rub or gallop Lungs: diminished breath sounds bilaterally Abdomen: soft, non-tender; bowel sounds normal; no masses,  no organomegaly Extremities: edema 2+  Lab Results: Recent Labs    09/15/19 1621 09/15/19 1630 09/16/19 0438 09/16/19 0446  WBC 9.6  --  9.8  --   HGB 9.0*   < > 8.7* 8.8*  HCT 32.1*   < > 31.2* 26.0*  PLT 134*  --  138*  --    < > = values in this interval not displayed.   BMET:  Recent Labs    09/15/19 1621 09/15/19 1630 09/16/19 0438 09/16/19 0446  NA 156*   < > 155* 155*  K 4.0   < > 3.7 3.6  CL 122*  --  118*  --   CO2 28  --  27  --   GLUCOSE 140*  --  136*  --   BUN 92*  --  100*  --   CREATININE 1.11  --  1.14  --   CALCIUM 9.1  --  9.1  --    < > = values in this interval not displayed.    PT/INR: No results for input(s): LABPROT, INR in the last 72 hours. ABG    Component Value Date/Time   PHART 7.423 09/16/2019 0446   HCO3 28.5 (H) 09/16/2019 0446   TCO2 30 09/16/2019 0446   ACIDBASEDEF 2.0 08/28/2019 1519   O2SAT 69.6 09/16/2019 0454   CBG (last 3)  Recent Labs    09/16/19 0004 09/16/19 0444 09/16/19 0734  GLUCAP 110* 120* 113*    Assessment/Plan: S/P Procedure(s) (LRB): PEG TUBE INSERTION - BEDSIDE (N/A) ESOPHAGOGASTRODUODENOSCOPY (EGD) (N/A) Diuresis bronchoscopy   LOS: 56 days    Wonda Olds 09/16/2019

## 2019-09-16 NOTE — Progress Notes (Signed)
ANTICOAGULATION CONSULT NOTE - Follow Up Consult  Pharmacy Consult for heparin Indication: ECMO  Labs: Recent Labs    09/15/19 0434 09/15/19 0435 09/15/19 1621 09/15/19 1630 09/16/19 0008 09/16/19 0008 09/16/19 0438 09/16/19 0446  HGB 9.3*   < > 9.0*   < > 8.8*   < > 8.7* 8.8*  HCT 32.1*   < > 32.1*   < > 26.0*  --  31.2* 26.0*  PLT 126*  --  134*  --   --   --  138*  --   APTT 99*  --  91*  --   --   --  115*  --   HEPARINUNFRC 0.25*  --  0.34  --   --   --  0.31  --   CREATININE 1.20  --  1.11  --   --   --  1.14  --    < > = values in this interval not displayed.    Assessment: 44yo male on ECMO heparin drip 2000 uts/hr HL 0.31 at therapeutic goal  No over bleeding - occassional bleeding from nose  - but much improved  LDH stable 280, fibrinogen 700s, h/h 8.8/26 Nasal packing removed 1/29  Goal of Therapy:  Heparin level 0.3-0.5 units/ml (aiming for lowest end)   Plan:  Continue heparin 2000 uts/hr  Check heparin  q12h  Monitor s/s bleeding  Bonnita Nasuti Pharm.D. CPP, BCPS Clinical Pharmacist [phone] 09/16/2019 7:46 AM

## 2019-09-16 NOTE — Procedures (Signed)
Bronchoscopy Procedure Note [name] [id number] 03/30/1976  Procedure: Bronchoscopy Indications: Remove secretions  Procedure Details Consent: Risks of procedure as well as the alternatives and risks of each were explained to the (patient/caregiver).  Consent for procedure obtained. Time Out: Verified patient identification, verified procedure, site/side was marked, verified correct patient position, special equipment/implants available, medications/allergies/relevent history reviewed, required imaging and test results available.  Performed  In preparation for procedure, patient was given 100% FiO2, bronchoscope lubricated and lidocaine given via ETT (3 ml). Sedation: Benzodiazepines and Opiates  Airway entered and the following bronchi were examined: RUL, RML, RLL, LUL, LLL and Bronchi.   Procedures performed: minimal secretions, no erythema. Bronchoscope removed.    Evaluation Hemodynamic Status: BP stable throughout; O2 sats: transiently fell during during procedure Patient's Current Condition: stable Specimens:  None Complications: No apparent complications Patient did tolerate procedure well.   [name] [name] 09/16/2019

## 2019-09-16 NOTE — CV Procedure (Signed)
   ECMO NOTE:  Indication: Respiratory failure due to COVID PNA  Initial cannulation date: 08/03/2019  ECMO type: VV ECMO (Cardio-Help)  Dual lumen  Inflow/return cannula:  1) 32 FR Crescent placed 2/2  ECMO events:  Initial cannulation 12/26 Circuit changed and trach exchanged on 1/8.   Underwent addition of a RIJ drainage cannula on 1/20 Circuit changed on 1/22 for rising LDH Had evidence of recirculation and RIJ cannula pulled back on 1/26 19 FR RIJ removed 09/06/19 Underwent change to [facility] catheter with removal of femoral lines 2/2  Daily data:  Remains on VV ecmo with good flows.   Flow 4.83 RPM 3565 dP 32 Pven  -85 Sweep  9  SVO2 58%  Labs:  Hgb 8.6 LDH 297 Heparin level: 0.25 Lactic acid 1.1  Plan:  Crescent cannula working well Excellent flow and sats CXR worse today -> start lasix and bronch Continue to wean in 4 hour bursts then allow to rest with sedation.  Watch DTIs Possibility of CVVHD raised -> no indication at this point.  Glori Bickers, MD  8:34 AM

## 2019-09-16 NOTE — Progress Notes (Signed)
Versed 25 mg wasted at this time in sharps with Misha, RN

## 2019-09-16 NOTE — Progress Notes (Signed)
ANTICOAGULATION CONSULT NOTE - Follow Up Consult  Pharmacy Consult for heparin Indication: ECMO  Labs: Recent Labs    09/15/19 0434 09/15/19 0435 09/15/19 1621 09/15/19 1630 09/16/19 0438 09/16/19 0446 09/16/19 1309 09/16/19 1309 09/16/19 1601 09/16/19 1608  HGB 9.3*   < > 9.0*   < > 8.7*   < > 9.9*   < > 9.3* 9.5*  HCT 32.1*   < > 32.1*   < > 31.2*   < > 29.0*  --  32.9* 28.0*  PLT 126*   < > 134*  --  138*  --   --   --  139*  --   APTT 99*  --  91*  --  115*  --   --   --   --   --   HEPARINUNFRC 0.25*   < > 0.34  --  0.31  --   --   --  0.42  --   CREATININE 1.20   < > 1.11  --  1.14  --   --   --  1.16  --    < > = values in this interval not displayed.    Assessment: 44yo male on ECMO heparin drip 2000 uts/hr HL 0.31 at therapeutic goal  No over bleeding - occassional bleeding from nose  - but much improved  H&H 9.5/28 Nasal packing removed 1/29  Hep lvl within goal 0.42  Goal of Therapy:  Heparin level 0.3-0.5 units/ml (aiming for lowest end)   Plan:  Continue heparin 2000 uts/hr  Check heparin  q12h  Monitor s/s bleeding  Barth Kirks, PharmD, BCPS, BCCCP Clinical Pharmacist [phone]  Please check AMION for all [place] numbers  09/16/2019 5:31 PM

## 2019-09-16 NOTE — Progress Notes (Signed)
50 cc of Dilaudid wasted with Kyung Rudd, RN

## 2019-09-16 NOTE — Progress Notes (Signed)
Patient ID: [name], male   DOB: [birth year], 44 y.o.   MRN: [id number]    Advanced Heart Failure Rounding Note   Subjective:    Remains on VV ecmo with good flows.   Sedated this am. Resting on vent. Sats in low to mid 90s.   CVP 13-14  Sweep 9  ABG 7.42/44/49/86%  Co-ox 70%   On vent at 40% on PC.TVs ~250-300cc   CXR slight worse aeration. Crescent catheter well positioned  Personally reviewed   hgb 8.7 wbc 9.6. Sodium 155  ECMO parameters - see separate ecmo note   Objective:   Weight Range:  Vital Signs:   Temp:  [98.1 F (36.7 C)-98.8 F (37.1 C)] 98.8 F (37.1 C) (02/08 1949) Pulse Rate:  [64-93] 88 (02/08 2000) Resp:  [0-25] 17 (02/08 2000) BP: (106-179)/(43-64) 179/64 (02/08 1602) SpO2:  [83 %-96 %] 92 % (02/08 2000) Arterial Line BP: (106-191)/(40-69) 151/54 (02/08 2000) FiO2 (%):  [40 %-60 %] 60 % (02/08 1929) Last BM Date: 09/16/19  Weight change: Filed Weights   09/14/19 0500 09/15/19 0141 09/15/19 0500  Weight: (!) 140.4 kg (!) 141.3 kg (!) 141.3 kg    Intake/Output:   Intake/Output Summary (Last 24 hours) at 09/16/2019 2020 Last data filed at 09/16/2019 2000 Gross per 24 hour  Intake 8070.97 ml  Output 7033 ml  Net 1037.97 ml     Physical Exam: General: Sedated on vent HEENT: normal DTI on occiput Neck: supple. RIJ crescent cannula. Carotids 2+ bilat; no bruits. No lymphadenopathy or thryomegaly appreciated. Cor: PMI nondisplaced. Regular rate & rhythm. No rubs, gallops or murmurs. Lungs: mild air movement Abdomen: obese soft, nontender, nondistended. No hepatosplenomegaly. No bruits or masses. Good bowel sounds. Extremities: no cyanosis, clubbing, rash, 2-3+ edema +DTIs Neuro intubated sedted    Telemetry: Sinus 80s  Personally reviewed  Labs: Basic Metabolic Panel: Recent Labs  Lab 09/11/19 0228 09/11/19 0458 09/14/19 1620 09/14/19 1635 09/15/19 0434 09/15/19 0435 09/15/19 1621 09/15/19 1630 09/16/19 0438  09/16/19 0446 09/16/19 1036 09/16/19 1121 09/16/19 1309 09/16/19 1601 09/16/19 1608  NA  --    < > 155*   < > 155*   < > 156*   < > 155*   < > 154* 154* 153* 151* 152*  K  --    < > 3.4*   < > 3.7   < > 4.0   < > 3.7   < > 4.5 4.4 4.3 4.7 4.6  CL  --    < > 117*  --  117*  --  122*  --  118*  --   --   --   --  111  --   CO2  --    < > 27  --  28  --  28  --  27  --   --   --   --  27  --   GLUCOSE  --    < > 144*  --  147*  --  140*  --  136*  --   --   --   --  118*  --   BUN  --    < > 91*  --  93*  --  92*  --  100*  --   --   --   --  103*  --   CREATININE  --    < > 1.17  --  1.20  --  1.11  --  1.14  --   --   --   --  1.16  --   CALCIUM  --    < > 9.4   < > 9.2   < > 9.1  --  9.1  --   --   --   --  9.1  --   MG 2.5*  --   --   --   --   --   --   --   --   --   --   --   --   --   --    < > = values in this interval not displayed.    Liver Function Tests: No results for input(s): AST, ALT, ALKPHOS, BILITOT, PROT, ALBUMIN in the last 168 hours. No results for input(s): LIPASE, AMYLASE in the last 168 hours. No results for input(s): AMMONIA in the last 168 hours.  CBC: Recent Labs  Lab 09/14/19 1620 09/14/19 1635 09/15/19 0434 09/15/19 0435 09/15/19 1621 09/15/19 1630 09/16/19 0438 09/16/19 0446 09/16/19 1036 09/16/19 1121 09/16/19 1309 09/16/19 1601 09/16/19 1608  WBC 10.8*  --  10.3  --  9.6  --  9.8  --   --   --   --  11.6*  --   HGB 9.5*   < > 9.3*   < > 9.0*   < > 8.7*   < > 10.9* 10.5* 9.9* 9.3* 9.5*  HCT 32.0*   < > 32.1*   < > 32.1*   < > 31.2*   < > 32.0* 31.0* 29.0* 32.9* 28.0*  MCV 95.2  --  97.3  --  98.2  --  99.0  --   --   --   --  97.3  --   PLT 137*  --  126*  --  134*  --  138*  --   --   --   --  139*  --    < > = values in this interval not displayed.    Cardiac Enzymes: No results for input(s): CKTOTAL, CKMB, CKMBINDEX, TROPONINI in the last 168 hours.  BNP: BNP (last 3 results) Recent Labs    07/22/19 1039  BNP 15.3    ProBNP  (last 3 results) No results for input(s): PROBNP in the last 8760 hours.    Other results:  Imaging: DG CHEST PORT 1 VIEW  Result Date: 09/16/2019 CLINICAL DATA:  44 year old male on ECMO EXAM: PORTABLE CHEST 1 VIEW COMPARISON:  Prior chest x-ray 09/15/2019 FINDINGS: ECMO catheter enters via the right internal jugular vein and passes through the right heart and into the inferior vena cava. Tracheostomy tube is present. The tip remains midline and at the level of the clavicles. Bilateral chest tubes are present and in stable position. No evidence of pneumothorax. Overall aeration has worsened. Inspiratory volumes are lower and there is significantly increased airspace opacity throughout the left lung base and increasing airspace opacity in the right perihilar and right basilar region. Left subclavian approach central venous catheter remains in stable position. The tip overlies the cavoatrial junction. No acute osseous abnormality. IMPRESSION: 1. Worsening aeration with decreased inspiratory volumes and significantly increased airspace opacity in the left lung base. Additionally, slightly increased airspace opacities in the right perihilar region and right medial base. 2. Stable and satisfactory support apparatus without significant interval change. 3. No pneumothorax. Electronically Signed   By: Jacqulynn Cadet M.D.   On: 09/16/2019 07:49   DG CHEST PORT 1 VIEW  Result Date: 09/15/2019 CLINICAL DATA:  44 year old male with respiratory failure due to  COVID-19 pneumonia. ECMO. EXAM: PORTABLE CHEST 1 VIEW COMPARISON:  Portable chest 09/14/2019 and earlier. FINDINGS: Portable AP semi upright view at 0417 hours. Stable tracheostomy. Stable bilateral chest tubes. Large caliber vascular catheter again projects vertically along the right chest near midline. Stable lung volumes and ventilation with residual widespread coarse pulmonary interstitial opacity. No pneumothorax identified. Small if any pleural  effusions. Stable cardiac size and mediastinal contours. Paucity of bowel gas in the upper abdomen. IMPRESSION: 1. Stable lines and tubes. 2. Stable lung volumes and ventilation with widespread residual coarse interstitial opacity. 3. No pneumothorax identified. Electronically Signed   By: Genevie Ann M.D.   On: 09/15/2019 06:56     Medications:     Scheduled Medications: . sodium chloride   Intravenous Once  . amiodarone  200 mg Oral Daily  . vitamin C  500 mg Per Tube Daily  . chlorhexidine gluconate (MEDLINE KIT)  15 mL Mouth Rinse BID  . Chlorhexidine Gluconate Cloth  6 each Topical Daily  . diazepam  20 mg Per Tube Q6H  . feeding supplement (PRO-STAT SUGAR FREE 64)  60 mL Oral 5 X Daily  . free water  500 mL Per Tube Q6H  . gabapentin  200 mg Per Tube Q8H  . hydrALAZINE  10 mg Per Tube Q8H  . HYDROmorphone  16 mg Per Tube Q4H  . insulin aspart  0-20 Units Subcutaneous Q4H  . ipratropium-albuterol  3 mL Nebulization Q4H  . mouth rinse  15 mL Mouth Rinse 10 times per day  . midazolam  2 mg Intravenous Once  . montelukast  10 mg Per Tube QHS  . oxymetazoline  1 spray Each Nare BID  . pantoprazole sodium  40 mg Per Tube QHS  . propranolol  20 mg Per Tube TID  . psyllium  1 packet Oral BID  . QUEtiapine  50 mg Per Tube BID  . sodium chloride flush  10-40 mL Intracatheter Q12H  . Thrombi-Pad  1 each Topical Once  . Thrombi-Pad  1 each Topical Once  . white petrolatum   Topical BID    Infusions: . sodium chloride 100 mL/hr at 09/16/19 2000  . sodium chloride    . sodium chloride Stopped (09/05/19 1900)  . sodium chloride 10 mL/hr at 09/11/19 2156  . albumin human 12.5 g (09/13/19 0049)  . dexmedetomidine (PRECEDEX) IV infusion 0.5 mcg/kg/hr (09/16/19 2000)  . feeding supplement (PIVOT 1.5 CAL) 1,000 mL (09/16/19 1428)  . furosemide (LASIX) infusion 8 mg/hr (09/16/19 2000)  . heparin 2,000 Units/hr (09/16/19 2000)  . HYDROmorphone Stopped (09/16/19 1724)  . midazolam Stopped  (09/16/19 0730)  . norepinephrine Stopped (09/16/19 0746)    PRN Medications: Place/Maintain arterial line **AND** sodium chloride, sodium chloride, sodium chloride, acetaminophen (TYLENOL) oral liquid 160 mg/5 mL, acetaminophen, albumin human, hydrALAZINE, HYDROmorphone, magic mouthwash, magnesium hydroxide, midazolam, ondansetron (ZOFRAN) IV, senna-docusate, sodium chloride   Assessment/Plan:    1. Acute hypoxic/hypercarbic respiratory failure due to COVID PNA: Remained markedly hypoxic despite full support. Intubated 12/26. S/p bilateral CTs 12/26 for pneumothoraces.  TEE on 12/26 LVEF 70% RV ok. VV ECMO begun 12/26. Tracheostomy 12/29.  COVID ARDS at this point. ECMO circuit changed 1/8 & 1/23. Crescent cannula placed 2/2 (femoral cannuals removed) - ECMO parameters as per ECMO note - Flows look  good on Crescent. CXR slightly worse today will plan bronch and IV lasix. Start lasix at 8 - LDH 289 - Continue heparin. No bleeding. Discussed dosing with PharmD personally. - Sodium  high. Continues to 3rd space. Will continue unna boots. Diurese gently  - Continue chest PT - Continue regular episodes of awakening and letting him work interspersed with sedation and rest periods.  2. Bilateral PTX: Due to barotrauma.  - Resolved  3. COVID PNA: management of COVID infection per CCM. CXR with bilateral multifocal PNA progressing to likely ARDS.  - s/p 10 days of remdesivir - 12/16, 12/2 s/p tociluzimab - 12/17 s/p convalescent plasma - Decadron at 18m/daily completed 1/11 - Completed cefepime/vanc - Off Ancef  4. ID: Empiric coverage with vancomycin/cefepime initially for possible secondary bacterial PNA, course completed.   - treated with cefepime and vanc for + cx with staph epidimidis, MSSA and serratia (sensitivities ok). - Currently off abx - BAL cx. With serratia and enterococcus felt to be colonization - Watch cx. Low threshold to add back abx. - WBC at 9/6 Afebrile  5.  Thrombocytopenia: Slow decrease in platelets, likely low level hemolysis + critical illness.  - resolved. Platelets 126  today Follow  6. Anemia: - hgb 8.6 today. Transfuse as needed to keep Hgb> 8.0  7. FEN - now with G-tube. Resume TF  8. Hypernatremia - NA 155 - FW increased over the weekend, May need some IVF  9. AKI - lasix has been on hold. Slightly improved - he is 3rd spacing. Continue unna boots.  - will restart lasix. Possibility of CVVHD discussed extensively on rounds today. No current indication.   10. DTIs - WOC following   D/w with ECMO team (ECMO coordinator/specialists, CCM, TCTS and pharmD) at bedside on multidisciplinary rounds. Continue to await ARDS recovery. Details as above.   Length of Stay: 578 CRITICAL CARE Performed by: BGlori Bickers Total critical care time: 35  minutes  Critical care time was exclusive of separately billable procedures and treating other patients.  Critical care was necessary to treat or prevent imminent or life-threatening deterioration.  Critical care was time spent personally by me (independent of midlevel providers or residents) on the following activities: development of treatment plan with patient and/or surrogate as well as nursing, discussions with consultants, evaluation of patient's response to treatment, examination of patient, obtaining history from patient or surrogate, ordering and performing treatments and interventions, ordering and review of laboratory studies, ordering and review of radiographic studies, pulse oximetry and re-evaluation of patient's condition.    DGlori BickersMD 09/16/2019, 8:20 PM  Advanced Heart Failure Team Pager [phone](M-F; 7a - 4p)  Please contact [facility] for night-coverage after hours (4p -7a ) and weekends on [email or web address]

## 2019-09-16 NOTE — Progress Notes (Signed)
RT note- Assisted with Bronchoscopy with Dr. Lynetta Mare, no events noted, placed on 100% fio2 prior to procedure, now remains back on current ventilator settings.

## 2019-09-16 NOTE — Progress Notes (Signed)
NAME:  [name], MRN:  [id number], DOB:  October 06, 1975, LOS: 73 ADMISSION DATE:  07/22/2019, CONSULTATION DATE:  12/24 REFERRING MD:  Sloan Leiter, CHIEF COMPLAINT:  Dyspnea   Brief History   44 y/o M admitted 12/14 with COVID pneumonia causing acute hypoxemic respiratory failure. Developed pneumomediastinum 12/23 with concern for possible bilateral small pneumothoraces on 12/24.  PCCM consulted for evaluation of pneumothoraces  Past Medical History  GERD HTN Asthma  Significant Hospital Events   12/14 admit with hypoxemic respiratory failure in setting of COVID-19 pneumonia 12/23 noted to have pneumomediastinum on chest x-ray 12/24 PCCM consulted for evaluation 12/26 worsening hypoxemia, intubated  12/27 VV fem/fem ECMO cannulation See bedside nursing event notes for full rundown of procedures after 12/27 1/30 improved cxr, improved lung compliance, TV 350s on 15PC  1/31 CXR increased infiltrates, +CFB     2/2 Converted to right IJ Crescent cannula. 2/3 PEG tube inserted at bedside  Consults:  PCCM, CTS, CHF, Palliative  Significant Diagnostic Tests:   CT chest 12/22 >> extensive pneumomediastinum, multifocal patchy bilateral groundglass opacities CXR 1/25 >> improving bilateral infiltrates. ECMO cannulae in place. CXR 2/5 >> clearing bilateral infiltrates  Micro Data:  BCx2 12/14 >> negative  Sputum 12/15 >> negative  1/9 bronch: serratia and MSSA 1/2 acid fast smear bronch: negative 1/2 fungal cx bronch: pending 1/12 BAL: serratia 1/18 BAL >> rare Serratia. 2/1 BAL >> rare Serratia - few WBC  Antimicrobials:  Received remdesivir, steroids, actemra and convalescent plasma  5 days cefepime 12/24-12/29 Cefepime 1/4->1/14 vanc 1/4->1/14 Ceftriaxone 1/14 >>1/27 Cefazolin 1/28>> 02/2   Interim history/subjective:   Desaturation overnight with improvement with diuresis.   Objective   Blood pressure (!) 106/43, pulse 76, temperature 98.2 F (36.8 C), temperature  source Core, resp. rate 17, height _0  (1.727 m), weight (!) 141.3 kg, SpO2 94 %. CVP:  [11 mmHg-29 mmHg] 22 mmHg  Vent Mode: PCV FiO2 (%):  [40 %-60 %] 50 % Set Rate:  [15 bmp] 15 bmp PEEP:  [16 cmH20] 16 cmH20 Pressure Support:  [16 cmH20] 16 cmH20 Plateau Pressure:  [30 cmH20] 30 cmH20   Intake/Output Summary (Last 24 hours) at 09/16/2019 0831 Last data filed at 09/16/2019 0800 Gross per 24 hour  Intake 6378.47 ml  Output 5013 ml  Net 1365.47 ml   Filed Weights   09/14/19 0500 09/15/19 0141 09/15/19 0500  Weight: (!) 140.4 kg (!) 141.3 kg (!) 141.3 kg   Examination GEN: ill obese man on vent HEENT: trach in place, no further bleeding. CV: distant, ext warm PULM: improved vesicular breath sounds anteriorly, no wheezing. Bronchial breath sounds have receded to mid-axillary line.  Breath stacking has improved. GI: Soft, +BS EXT: diffuse anasarca improved NEURO: opens eyes to voice, occasional coughing on vent PSYCH: deferred SKIN: slightly pale.  Areas of skin breakdown from support devices.   CT head 1/28: pansinusitis. CT chest 1/28: Dense bilateral consolidation in dependent lungs both sides CT abdomen 1/28: Cannulae in position in IVC.  Flow (LPM): 4.96 ECMO Mode: VV ECMO Device: Cardiohelp  Vent Mode: PCV, Set Rate: 15 bmp, FiO2 (%): 50 %, I Time: 0.9 Sec(s), PEEP: 16 cmH20 Vt today 324m  Net IO Since Admission: 486,767.20mL [09/16/19 0831]  Resolved Hospital Problem list   MSSA and serratia marcescens pneumonia  Epistaxis Atrial fibrillation Assessment & Plan:   Acute hypoxemic respiratory failure with ARDS secondary to COVID-19, status post tracheostomy on mechanical ventilation and VV ECMO support.  Slowly resolving ARDS.  Limited respiratory reserve with persistently high burden of disease, in keeping with natural history of COVID related ARDS.  Generally easier flow following cannula change. - Bronchoscopy today.  - Continue lung protective ventilation.   - Diuresis today to achieve -1 to -2 L negative fluid balance.  - Allow periods of more spontaneous breathing alternating with rest.  Allow for uninterrupted sleep at nighttime  Hypernatremia - increased free water enteral and 0.45% saline. - metamucil to bulk stool to avoid free water loss in diarrhea.  Daily Goals Checklist   Pain/Anxiety/Delirium protocol (if indicated): keep infusions off. Continue enteral agents. Dexmedetomidine and prn Versed and Dilaudid VAP protocol (if indicated): Bundle in place. Respiratory support goals: Pressure control ventilation with ultra protective lung ventilation. Titrate FiO2 down again. Continue PEEP 16  Blood pressure target:  MAP>65, off norepinephrine and IV amiodarone.  DVT prophylaxis: Remains on systemic heparin - target Heparin level 0.3-0.7 Nutrition Status: Nutrition Problem: Increased nutrient needs Etiology: acute illness(COVID PNA) Signs/Symptoms: estimated needs Interventions: Tube feeding, Prostat Aim for lower caloric goal.  Resume feeds at lower rate.  Maintain protein intake.   GI prophylaxis: Protonix per tube. Fluid status goals: diuresis today, restart furosemide infusion at 46m/h and titrate to urine output and CVP. Urinary catheter: Guide hemodynamic management Glucose control: Euglycemic control with SSI Mobility/therapy needs: Bedrest.  Daily labs: as per ECMO protocol. Code Status: Full code  Family Communication:family updated by RN. Disposition: ICU  Labs   CBC: CBC Latest Ref Rng & Units 09/16/2019 09/16/2019 09/16/2019  WBC 4.0 - 10.5 K/uL - 9.8 -  Hemoglobin 13.0 - 17.0 g/dL 8.8(L) 8.7(L) 8.8(L)  Hematocrit 39.0 - 52.0 % 26.0(L) 31.2(L) 26.0(L)  Platelets 150 - 400 K/uL - 138(L) -    Basic Metabolic Panel: BMP Latest Ref Rng & Units 09/16/2019 09/16/2019 09/16/2019  Glucose 70 - 99 mg/dL - 136(H) -  BUN 6 - 20 mg/dL - 100(H) -  Creatinine 0.61 - 1.24 mg/dL - 1.14 -  Sodium 135 - 145 mmol/L 155(H) 155(H) 157(H)   Potassium 3.5 - 5.1 mmol/L 3.6 3.7 3.6  Chloride 98 - 111 mmol/L - 118(H) -  CO2 22 - 32 mmol/L - 27 -  Calcium 8.9 - 10.3 mg/dL - 9.1 -    GFR: Estimated Creatinine Clearance: 113 mL/min (by C-G formula based on SCr of 1.14 mg/dL). Recent Labs  Lab 09/13/19 0325 09/13/19 1631 09/14/19 0408 09/14/19 0408 09/14/19 1620 09/15/19 0434 09/15/19 1621 09/16/19 0438  WBC 10.8*   < > 9.6   < > 10.8* 10.3 9.6 9.8  LATICACIDVEN 1.0  --  0.9  --   --  0.9  --  1.1   < > = values in this interval not displayed.    Liver Function Tests: No results for input(s): AST, ALT, ALKPHOS, BILITOT, PROT, ALBUMIN in the last 168 hours. No results for input(s): LIPASE, AMYLASE in the last 168 hours. No results for input(s): AMMONIA in the last 168 hours.  ABG    Component Value Date/Time   PHART 7.423 09/16/2019 0446   PCO2ART 43.6 09/16/2019 0446   PO2ART 49.0 (L) 09/16/2019 0446   HCO3 28.5 (H) 09/16/2019 0446   TCO2 30 09/16/2019 0446   ACIDBASEDEF 2.0 08/28/2019 1519   O2SAT 69.6 09/16/2019 0454     Coagulation Profile: No results for input(s): INR, PROTIME in the last 168 hours.  Cardiac Enzymes: No results for input(s): CKTOTAL, CKMB, CKMBINDEX, TROPONINI in the last 168 hours.  HbA1C: Hgb A1c  MFr Bld  Date/Time Value Ref Range Status  07/24/2019 04:50 AM 6.7 (H) 4.8 - 5.6 % Final    Comment:    (NOTE) Pre diabetes:          5.7%-6.4% Diabetes:              >6.4% Glycemic control for   <7.0% adults with diabetes   09/30/2017 05:36 AM 5.7 (H) 4.8 - 5.6 % Final    Comment:    (NOTE)         Prediabetes: 5.7 - 6.4         Diabetes: >6.4         Glycemic control for adults with diabetes: <7.0     CBG: Recent Labs  Lab 09/15/19 1628 09/15/19 1958 09/16/19 0004 09/16/19 0444 09/16/19 0734  GLUCAP 128* 126* 110* 120* 113*     CRITICAL CARE Performed by: Kipp Brood   Total critical care time: 40 minutes  Critical care time was exclusive of separately  billable procedures and treating other patients.  Critical care was necessary to treat or prevent imminent or life-threatening deterioration.  Critical care was time spent personally by me on the following activities: development of treatment plan with patient and/or surrogate as well as nursing, discussions with consultants, evaluation of patient's response to treatment, examination of patient, obtaining history from patient or surrogate, ordering and performing treatments and interventions, ordering and review of laboratory studies, ordering and review of radiographic studies, pulse oximetry, re-evaluation of patient's condition and participation in multidisciplinary rounds.  Kipp Brood, MD [facility] ICU Physician [place]  Pager: [phone] Mobile: [phone] After hours: [phone].

## 2019-09-17 ENCOUNTER — Inpatient Hospital Stay (HOSPITAL_COMMUNITY): Payer: Managed Care, Other (non HMO)

## 2019-09-17 LAB — BASIC METABOLIC PANEL
Anion gap: 11 (ref 5–15)
Anion gap: 11 (ref 5–15)
Anion gap: 7 (ref 5–15)
BUN: 102 mg/dL — ABNORMAL HIGH (ref 6–20)
BUN: 103 mg/dL — ABNORMAL HIGH (ref 6–20)
BUN: 105 mg/dL — ABNORMAL HIGH (ref 6–20)
CO2: 26 mmol/L (ref 22–32)
CO2: 27 mmol/L (ref 22–32)
CO2: 29 mmol/L (ref 22–32)
Calcium: 8.4 mg/dL — ABNORMAL LOW (ref 8.9–10.3)
Calcium: 8.9 mg/dL (ref 8.9–10.3)
Calcium: 9 mg/dL (ref 8.9–10.3)
Chloride: 110 mmol/L (ref 98–111)
Chloride: 111 mmol/L (ref 98–111)
Chloride: 115 mmol/L — ABNORMAL HIGH (ref 98–111)
Creatinine, Ser: 1.08 mg/dL (ref 0.61–1.24)
Creatinine, Ser: 1.13 mg/dL (ref 0.61–1.24)
Creatinine, Ser: 1.13 mg/dL (ref 0.61–1.24)
GFR calc Af Amer: >60 mL/min (ref >60)
GFR calc Af Amer: >60 mL/min (ref >60)
GFR calc Af Amer: >60 mL/min (ref >60)
GFR calc non Af Amer: >60 mL/min (ref >60)
GFR calc non Af Amer: >60 mL/min (ref >60)
GFR calc non Af Amer: >60 mL/min (ref >60)
Glucose, Bld: 133 mg/dL — ABNORMAL HIGH (ref 70–99)
Glucose, Bld: 137 mg/dL — ABNORMAL HIGH (ref 70–99)
Glucose, Bld: 137 mg/dL — ABNORMAL HIGH (ref 70–99)
Potassium: 3.9 mmol/L (ref 3.5–5.1)
Potassium: 4.1 mmol/L (ref 3.5–5.1)
Potassium: 4.2 mmol/L (ref 3.5–5.1)
Sodium: 148 mmol/L — ABNORMAL HIGH (ref 135–145)
Sodium: 149 mmol/L — ABNORMAL HIGH (ref 135–145)
Sodium: 150 mmol/L — ABNORMAL HIGH (ref 135–145)

## 2019-09-17 LAB — CBC
HCT: 31.4 % — ABNORMAL LOW (ref 39.0–52.0)
HCT: 33.1 % — ABNORMAL LOW (ref 39.0–52.0)
HCT: 33.3 % — ABNORMAL LOW (ref 39.0–52.0)
Hemoglobin: 8.8 g/dL — ABNORMAL LOW (ref 13.0–17.0)
Hemoglobin: 9.3 g/dL — ABNORMAL LOW (ref 13.0–17.0)
Hemoglobin: 9.4 g/dL — ABNORMAL LOW (ref 13.0–17.0)
MCH: 27.6 pg (ref 26.0–34.0)
MCH: 27.7 pg (ref 26.0–34.0)
MCH: 27.8 pg (ref 26.0–34.0)
MCHC: 27.9 g/dL — ABNORMAL LOW (ref 30.0–36.0)
MCHC: 28 g/dL — ABNORMAL LOW (ref 30.0–36.0)
MCHC: 28.4 g/dL — ABNORMAL LOW (ref 30.0–36.0)
MCV: 97.4 fL (ref 80.0–100.0)
MCV: 98.7 fL (ref 80.0–100.0)
MCV: 99.4 fL (ref 80.0–100.0)
Platelets: 153 10*3/uL (ref 150–400)
Platelets: 164 10*3/uL (ref 150–400)
Platelets: 168 10*3/uL (ref 150–400)
RBC: 3.18 MIL/uL — ABNORMAL LOW (ref 4.22–5.81)
RBC: 3.35 MIL/uL — ABNORMAL LOW (ref 4.22–5.81)
RBC: 3.4 MIL/uL — ABNORMAL LOW (ref 4.22–5.81)
RDW: 18.1 % — ABNORMAL HIGH (ref 11.5–15.5)
RDW: 18.3 % — ABNORMAL HIGH (ref 11.5–15.5)
RDW: 18.5 % — ABNORMAL HIGH (ref 11.5–15.5)
WBC: 11.6 10*3/uL — ABNORMAL HIGH (ref 4.0–10.5)
WBC: 12.3 10*3/uL — ABNORMAL HIGH (ref 4.0–10.5)
WBC: 13.7 10*3/uL — ABNORMAL HIGH (ref 4.0–10.5)
nRBC: 0 % (ref 0.0–0.2)
nRBC: 0.1 % (ref 0.0–0.2)
nRBC: 0.2 % (ref 0.0–0.2)

## 2019-09-17 LAB — POCT I-STAT 7, (LYTES, BLD GAS, ICA,H+H)
Acid-Base Excess: 5 mmol/L — ABNORMAL HIGH (ref 0.0–2.0)
Acid-Base Excess: 3 mmol/L — ABNORMAL HIGH (ref 0.0–2.0)
Acid-Base Excess: 5 mmol/L — ABNORMAL HIGH (ref 0.0–2.0)
Bicarbonate: 30.5 mmol/L — ABNORMAL HIGH (ref 20.0–28.0)
Bicarbonate: 29.4 mmol/L — ABNORMAL HIGH (ref 20.0–28.0)
Bicarbonate: 31.3 mmol/L — ABNORMAL HIGH (ref 20.0–28.0)
Calcium, Ion: 1.3 mmol/L (ref 1.15–1.40)
Calcium, Ion: 1.29 mmol/L (ref 1.15–1.40)
Calcium, Ion: 1.38 mmol/L (ref 1.15–1.40)
HCT: 27 % — ABNORMAL LOW (ref 39.0–52.0)
HCT: 29 % — ABNORMAL LOW (ref 39.0–52.0)
HCT: 28 % — ABNORMAL LOW (ref 39.0–52.0)
Hemoglobin: 9.2 g/dL — ABNORMAL LOW (ref 13.0–17.0)
Hemoglobin: 9.9 g/dL — ABNORMAL LOW (ref 13.0–17.0)
Hemoglobin: 9.5 g/dL — ABNORMAL LOW (ref 13.0–17.0)
O2 Saturation: 93 %
O2 Saturation: 95 %
O2 Saturation: 96 %
Patient temperature: 98.5
Patient temperature: 36.7
Patient temperature: 36.6
Potassium: 4 mmol/L (ref 3.5–5.1)
Potassium: 3.8 mmol/L (ref 3.5–5.1)
Potassium: 3.7 mmol/L (ref 3.5–5.1)
Sodium: 151 mmol/L — ABNORMAL HIGH (ref 135–145)
Sodium: 150 mmol/L — ABNORMAL HIGH (ref 135–145)
Sodium: 147 mmol/L — ABNORMAL HIGH (ref 135–145)
TCO2: 32 mmol/L (ref 22–32)
TCO2: 31 mmol/L (ref 22–32)
TCO2: 33 mmol/L — ABNORMAL HIGH (ref 22–32)
pCO2 arterial: 51.4 mmHg — ABNORMAL HIGH (ref 32.0–48.0)
pCO2 arterial: 51.7 mmHg — ABNORMAL HIGH (ref 32.0–48.0)
pCO2 arterial: 55.4 mmHg — ABNORMAL HIGH (ref 32.0–48.0)
pH, Arterial: 7.381 (ref 7.350–7.450)
pH, Arterial: 7.362 (ref 7.350–7.450)
pH, Arterial: 7.359 (ref 7.350–7.450)
pO2, Arterial: 71 mmHg — ABNORMAL LOW (ref 83.0–108.0)
pO2, Arterial: 77 mmHg — ABNORMAL LOW (ref 83.0–108.0)
pO2, Arterial: 84 mmHg (ref 83.0–108.0)

## 2019-09-17 LAB — COOXEMETRY PANEL
Carboxyhemoglobin: 3.7 % — ABNORMAL HIGH (ref 0.5–1.5)
Methemoglobin: 1.1 % (ref 0.0–1.5)
O2 Saturation: 80.2 %
Total hemoglobin: 7.7 g/dL — ABNORMAL LOW (ref 12.0–16.0)

## 2019-09-17 LAB — APTT
aPTT: 77 seconds — ABNORMAL HIGH (ref 24–36)
aPTT: 83 seconds — ABNORMAL HIGH (ref 24–36)

## 2019-09-17 LAB — GLUCOSE, CAPILLARY
Glucose-Capillary: 116 mg/dL — ABNORMAL HIGH (ref 70–99)
Glucose-Capillary: 124 mg/dL — ABNORMAL HIGH (ref 70–99)
Glucose-Capillary: 129 mg/dL — ABNORMAL HIGH (ref 70–99)
Glucose-Capillary: 139 mg/dL — ABNORMAL HIGH (ref 70–99)
Glucose-Capillary: 143 mg/dL — ABNORMAL HIGH (ref 70–99)
Glucose-Capillary: 64 mg/dL — ABNORMAL LOW (ref 70–99)

## 2019-09-17 LAB — FIBRINOGEN: Fibrinogen: 712 mg/dL — ABNORMAL HIGH (ref 210–475)

## 2019-09-17 LAB — FUNGUS CULTURE WITH STAIN

## 2019-09-17 LAB — MAGNESIUM: Magnesium: 2 mg/dL (ref 1.7–2.4)

## 2019-09-17 LAB — LACTIC ACID, PLASMA: Lactic Acid, Venous: 0.9 mmol/L (ref 0.5–1.9)

## 2019-09-17 LAB — HEPARIN LEVEL (UNFRACTIONATED)
Heparin Unfractionated: 0.25 IU/mL — ABNORMAL LOW (ref 0.30–0.70)
Heparin Unfractionated: 0.36 IU/mL (ref 0.30–0.70)

## 2019-09-17 LAB — LACTATE DEHYDROGENASE: LDH: 365 U/L — ABNORMAL HIGH (ref 98–192)

## 2019-09-17 LAB — PHOSPHORUS: Phosphorus: 4.7 mg/dL — ABNORMAL HIGH (ref 2.5–4.6)

## 2019-09-17 LAB — FUNGAL ORGANISM REFLEX

## 2019-09-17 LAB — FUNGUS CULTURE RESULT

## 2019-09-17 MED ORDER — PRO-STAT SUGAR FREE PO LIQD
60.0000 mL | Freq: Four times a day (QID) | ORAL | Status: DC
  Administered 2019-09-17 – 2019-09-23 (×23): 60 mL
  Filled 2019-09-17 (×23): qty 60

## 2019-09-17 MED ORDER — JUVEN PO PACK
1.0000 | PACK | Freq: Two times a day (BID) | ORAL | Status: DC
  Administered 2019-09-17 – 2019-11-14 (×110): 1
  Filled 2019-09-17 (×111): qty 1

## 2019-09-17 MED ORDER — PRO-STAT SUGAR FREE PO LIQD: 60.0000 mL | Freq: Three times a day (TID) | ORAL | Status: DC

## 2019-09-17 MED ORDER — OSMOLITE 1.5 CAL PO LIQD
237.0000 mL | Freq: Every day | ORAL | Status: DC
  Administered 2019-09-17 – 2019-09-23 (×35): 237 mL
  Filled 2019-09-17 (×39): qty 237

## 2019-09-17 MED ORDER — PROPRANOLOL HCL 20 MG/5ML PO SOLN
40.0000 mg | Freq: Three times a day (TID) | ORAL | Status: DC
  Administered 2019-09-17 – 2019-09-23 (×19): 40 mg
  Filled 2019-09-17 (×21): qty 10

## 2019-09-17 MED ORDER — POTASSIUM CHLORIDE 20 MEQ/15ML (10%) PO SOLN
40.0000 meq | Freq: Once | ORAL | Status: AC
  Administered 2019-09-17: 19:00:00 40 meq
  Filled 2019-09-17: qty 30

## 2019-09-17 MED ORDER — DEXTROSE 50 % IV SOLN
12.5000 g | INTRAVENOUS | Status: AC
  Administered 2019-09-17: 08:00:00 12.5 g via INTRAVENOUS
  Filled 2019-09-17: qty 50

## 2019-09-17 NOTE — Progress Notes (Signed)
Patient ID: [name], male   DOB: Mar 10, 1976, 44 y.o.   MRN: [id number]    Advanced Heart Failure Rounding Note   Subjective:    Remains on VV ecmo with good flows.   Sedated on Precedex and po dilaudid  CVP 14  Sweep 9  ABG 7.36/52/77/95%  Co-ox 80%   On vent at 60% on PC.TVs ~250-325cc  On lasix gtt at 8 with good output ~ 200/hr  CXR with diffuse bilateral infiltrates. Crescent catheter well positioned  Personally reviewed   hgb 9.2  wbc11.6. Sodium 155-> 151  ECMO parameters - see separate ecmo note   Objective:   Weight Range:  Vital Signs:   Temp:  [98.6 F (37 C)-99.2 F (37.3 C)] 98.9 F (37.2 C) (02/09 1130) Pulse Rate:  [79-94] 84 (02/09 1300) Resp:  [12-25] 12 (02/09 1300) BP: (138-179)/(46-64) 152/61 (02/09 1338) SpO2:  [88 %-96 %] 95 % (02/09 1300) Arterial Line BP: (137-177)/(44-69) 149/62 (02/09 1300) FiO2 (%):  [50 %-60 %] 60 % (02/09 1101) Weight:  [141.3 kg] 141.3 kg (02/09 0600) Last BM Date: 09/16/19  Weight change: Filed Weights   09/15/19 0141 09/15/19 0500 09/17/19 0600  Weight: (!) 141.3 kg (!) 141.3 kg (!) 141.3 kg    Intake/Output:   Intake/Output Summary (Last 24 hours) at 09/17/2019 1349 Last data filed at 09/17/2019 1344 Gross per 24 hour  Intake 7081.33 ml  Output 7725 ml  Net -643.67 ml     Physical Exam: General: Sedated on vent Will arouse HEENT: normal DTI on occiput Neck: supple. RIJ cannula ok Carotids 2+ bilat; no bruits. No lymphadenopathy or thryomegaly appreciated. Cor: PMI nondisplaced. Regular rate & rhythm. No rubs, gallops or murmurs. Lungs: clear with mild air movement  Abdomen: obese soft, nontender, nondistended. No hepatosplenomegaly. No bruits or masses. Good bowel sounds. Extremities: no cyanosis, clubbing, rash, 2+ edema +DTI on both feet Neuro: sedated   Telemetry: Sinus 80-90s  Personally reviewed  Labs: Basic Metabolic Panel: Recent Labs  Lab 09/11/19 0228 09/11/19 0458  09/15/19 1621 09/15/19 1630 09/16/19 0438 09/16/19 0446 09/16/19 1601 09/16/19 1601 09/16/19 1608 09/16/19 2334 09/16/19 2356 09/17/19 0445 09/17/19 0454  NA  --    < > 156*   < > 155*   < > 151*   < > 152* 154* 148* 149* 151*  K  --    < > 4.0   < > 3.7   < > 4.7   < > 4.6 3.4* 4.2 4.1 4.0  CL  --    < > 122*  --  118*  --  111  --   --   --  115* 111  --   CO2  --    < > 28  --  27  --  27  --   --   --  26 27  --   GLUCOSE  --    < > 140*  --  136*  --  118*  --   --   --  137* 133*  --   BUN  --    < > 92*  --  100*  --  103*  --   --   --  103* 105*  --   CREATININE  --    < > 1.11  --  1.14  --  1.16  --   --   --  1.08 1.13  --   CALCIUM  --    < >  9.1   < > 9.1   < > 9.1  --   --   --  8.4* 8.9  --   MG 2.5*  --   --   --   --   --   --   --   --   --   --  2.0  --   PHOS  --   --   --   --   --   --   --   --   --   --   --  4.7*  --    < > = values in this interval not displayed.    Liver Function Tests: No results for input(s): AST, ALT, ALKPHOS, BILITOT, PROT, ALBUMIN in the last 168 hours. No results for input(s): LIPASE, AMYLASE in the last 168 hours. No results for input(s): AMMONIA in the last 168 hours.  CBC: Recent Labs  Lab 09/15/19 1621 09/15/19 1630 09/16/19 0438 09/16/19 0446 09/16/19 1601 09/16/19 1601 09/16/19 1608 09/16/19 2334 09/16/19 2356 09/17/19 0445 09/17/19 0454  WBC 9.6  --  9.8  --  11.6*  --   --   --  13.7* 11.6*  --   HGB 9.0*   < > 8.7*   < > 9.3*   < > 9.5* 7.5* 9.3* 8.8* 9.2*  HCT 32.1*   < > 31.2*   < > 32.9*   < > 28.0* 22.0* 33.3* 31.4* 27.0*  MCV 98.2  --  99.0  --  97.3  --   --   --  99.4 98.7  --   PLT 134*  --  138*  --  139*  --   --   --  164 153  --    < > = values in this interval not displayed.    Cardiac Enzymes: No results for input(s): CKTOTAL, CKMB, CKMBINDEX, TROPONINI in the last 168 hours.  BNP: BNP (last 3 results) Recent Labs    07/22/19 1039  BNP 15.3    ProBNP (last 3 results) No results  for input(s): PROBNP in the last 8760 hours.    Other results:  Imaging: DG CHEST PORT 1 VIEW  Result Date: 09/17/2019 CLINICAL DATA:  Respiratory failure EXAM: PORTABLE CHEST 1 VIEW COMPARISON:  September 16, 2019 FINDINGS: Tracheostomy catheter tip is 4.8 cm above the carina. There are chest tubes on each side, unchanged in position. There is and extracorporeal membrane oxygenation catheter extending from the right jugular vein to the level of the inferior vena cava. There is a central catheter with tip in superior vena cava. No evident pneumothorax. There is widespread airspace opacity bilaterally, likely primarily due to edema. There is cardiomegaly with pulmonary venous hypertension. No adenopathy. No bone lesions. IMPRESSION: Tube and catheter positions as described without evident pneumothorax. Cardiomegaly with pulmonary vascular congestion noted. Widespread airspace opacity, likely edema. Superimposed pneumonia cannot be excluded. Appearance similar to 1 day prior. Electronically Signed   By: Lowella Grip III M.D.   On: 09/17/2019 08:57   DG CHEST PORT 1 VIEW  Result Date: 09/16/2019 CLINICAL DATA:  44 year old male on ECMO EXAM: PORTABLE CHEST 1 VIEW COMPARISON:  Prior chest x-ray 09/15/2019 FINDINGS: ECMO catheter enters via the right internal jugular vein and passes through the right heart and into the inferior vena cava. Tracheostomy tube is present. The tip remains midline and at the level of the clavicles. Bilateral chest tubes are present and in stable position. No evidence of pneumothorax. Overall  aeration has worsened. Inspiratory volumes are lower and there is significantly increased airspace opacity throughout the left lung base and increasing airspace opacity in the right perihilar and right basilar region. Left subclavian approach central venous catheter remains in stable position. The tip overlies the cavoatrial junction. No acute osseous abnormality. IMPRESSION: 1. Worsening  aeration with decreased inspiratory volumes and significantly increased airspace opacity in the left lung base. Additionally, slightly increased airspace opacities in the right perihilar region and right medial base. 2. Stable and satisfactory support apparatus without significant interval change. 3. No pneumothorax. Electronically Signed   By: Jacqulynn Cadet M.D.   On: 09/16/2019 07:49     Medications:     Scheduled Medications: . amiodarone  200 mg Oral Daily  . vitamin C  500 mg Per Tube Daily  . chlorhexidine gluconate (MEDLINE KIT)  15 mL Mouth Rinse BID  . Chlorhexidine Gluconate Cloth  6 each Topical Daily  . diazepam  20 mg Per Tube Q6H  . feeding supplement (PRO-STAT SUGAR FREE 64)  60 mL Oral 5 X Daily  . free water  500 mL Per Tube Q6H  . gabapentin  200 mg Per Tube Q8H  . hydrALAZINE  10 mg Per Tube Q8H  . HYDROmorphone  16 mg Per Tube Q4H  . insulin aspart  0-20 Units Subcutaneous Q4H  . ipratropium-albuterol  3 mL Nebulization Q4H  . mouth rinse  15 mL Mouth Rinse 10 times per day  . midazolam  2 mg Intravenous Once  . montelukast  10 mg Per Tube QHS  . oxymetazoline  1 spray Each Nare BID  . pantoprazole sodium  40 mg Per Tube QHS  . propranolol  40 mg Per Tube TID  . psyllium  1 packet Oral BID  . QUEtiapine  50 mg Per Tube BID  . sodium chloride flush  10-40 mL Intracatheter Q12H  . white petrolatum   Topical BID    Infusions: . sodium chloride 100 mL/hr at 09/17/19 1200  . sodium chloride    . sodium chloride Stopped (09/05/19 1900)  . sodium chloride 10 mL/hr at 09/11/19 2156  . albumin human 12.5 g (09/13/19 0049)  . dexmedetomidine (PRECEDEX) IV infusion 0.4 mcg/kg/hr (09/17/19 1200)  . feeding supplement (PIVOT 1.5 CAL) 1,000 mL (09/16/19 1428)  . furosemide (LASIX) infusion 8 mg/hr (09/17/19 1200)  . heparin 2,000 Units/hr (09/17/19 1200)  . HYDROmorphone Stopped (09/17/19 1059)  . midazolam Stopped (09/16/19 0730)  . norepinephrine Stopped  (09/16/19 0746)    PRN Medications: Place/Maintain arterial line **AND** sodium chloride, sodium chloride, sodium chloride, acetaminophen (TYLENOL) oral liquid 160 mg/5 mL, acetaminophen, albumin human, hydrALAZINE, HYDROmorphone, magic mouthwash, magnesium hydroxide, midazolam, ondansetron (ZOFRAN) IV, senna-docusate, sodium chloride   Assessment/Plan:    1. Acute hypoxic/hypercarbic respiratory failure due to COVID PNA: Remained markedly hypoxic despite full support. Intubated 12/26. S/p bilateral CTs 12/26 for pneumothoraces.  TEE on 12/26 LVEF 70% RV ok. VV ECMO begun 12/26. Tracheostomy 12/29.  COVID ARDS at this point. ECMO circuit changed 1/8 & 1/23. Crescent cannula placed 2/2 (femoral cannuals removed) - ECMO parameters as per ECMO note - Flows look  good on Crescent. CXR slightly worse today. Continue lasix gtt. Bronch yesterday ok.   - LDH 289-> 365 - Continue heparin. No bleeding. Discussed dosing with PharmD personally. - Sodium high. Continues to 3rd space. Continue IV lasix - Continue chest PT - Continue regular episodes of awakening and letting him work interspersed with sedation and rest periods.  2. Bilateral PTX: Due to barotrauma.  - Resolved  3. COVID PNA: management of COVID infection per CCM. CXR with bilateral multifocal PNA progressing to likely ARDS.  - s/p 10 days of remdesivir - 12/16, 12/2 s/p tociluzimab - 12/17 s/p convalescent plasma - Decadron at 32m/daily completed 1/11 - Completed cefepime/vanc - Off Ancef  4. ID: Empiric coverage with vancomycin/cefepime initially for possible secondary bacterial PNA, course completed.   - treated with cefepime and vanc for + cx with staph epidimidis, MSSA and serratia (sensitivities ok). - Currently off abx - BAL cx. With serratia and enterococcus felt to be colonization - Watch cx. Low threshold to add back abx. - WBC ok. Afebrile   5. Thrombocytopenia: Slow decrease in platelets, likely low level hemolysis  + critical illness.  - resolved. Platelets 153  today Follow  6. Anemia: - hgb 89.2 today. Transfuse as needed to keep Hgb> 8.0  7. FEN - now with G-tube. Resume TF  8. Hypernatremia - NA 155 - FW increased over the weekend, May need some IVF  9. AKI - Back on lasix gtt with good output  - he is 3rd spacing. Continue unna boots.  - Creatinine stable. BUN > 100 -  Possibility of CVVHD discussed extensively on rounds today. No current indication.   10. DTIs - WOC following. Will ask them to see again today   D/w with ECMO team (ECMO coordinator/specialists, CCM, TCTS and pharmD) at bedside on multidisciplinary rounds. Continue to await ARDS recovery. Details as above.   Length of Stay: 552 CRITICAL CARE Performed by: BGlori Bickers Total critical care time: 35  minutes  Critical care time was exclusive of separately billable procedures and treating other patients.  Critical care was necessary to treat or prevent imminent or life-threatening deterioration.  Critical care was time spent personally by me (independent of midlevel providers or residents) on the following activities: development of treatment plan with patient and/or surrogate as well as nursing, discussions with consultants, evaluation of patient's response to treatment, examination of patient, obtaining history from patient or surrogate, ordering and performing treatments and interventions, ordering and review of laboratory studies, ordering and review of radiographic studies, pulse oximetry and re-evaluation of patient's condition.    DGlori BickersMD 09/17/2019, 1:49 PM  Advanced Heart Failure Team Pager [phone](M-F; [address]  Please contact [facility] for night-coverage after hours (4p -7a ) and weekends on [email or web address]

## 2019-09-17 NOTE — Progress Notes (Signed)
ANTICOAGULATION CONSULT NOTE - Follow Up Consult  Pharmacy Consult for heparin Indication: ECMO  Labs: Recent Labs    09/16/19 0438 09/16/19 0446 09/16/19 1601 09/16/19 1608 09/16/19 2356 09/16/19 2356 09/17/19 0445 09/17/19 0454  HGB 8.7*   < > 9.3*   < > 9.3*   < > 8.8* 9.2*  HCT 31.2*   < > 32.9*   < > 33.3*  --  31.4* 27.0*  PLT 138*   < > 139*  --  164  --  153  --   APTT 115*  --  102*  --   --   --  77*  --   HEPARINUNFRC 0.31  --  0.42  --   --   --  0.25*  --   CREATININE 1.14   < > 1.16  --  1.08  --  1.13  --    < > = values in this interval not displayed.    Assessment: 44yo male on ECMO heparin drip 2000 uts/hr HL 0.25 slightly lower than therapeutic goal  No over bleeding - but some bleeding from trach  - but much improved  H&H 9 /27 Nasal packing removed 1/29  Goal of Therapy:  Heparin level 0.3-0.5 units/ml (aiming for lowest end)   Plan:  Continue heparin 2000 uts/hr  Check heparin  q12h  Monitor s/s bleeding  Bonnita Nasuti Pharm.D. CPP, BCPS Clinical Pharmacist [phone] 09/17/2019 1:50 PM     Please check AMION for all [place] numbers  09/17/2019 1:46 PM

## 2019-09-17 NOTE — Plan of Care (Signed)
  Problem: Education: Goal: Knowledge of risk factors and measures for prevention of condition will improve Outcome: Progressing   Problem: Coping: Goal: Psychosocial and spiritual needs will be supported Outcome: Progressing   Problem: Respiratory: Goal: Will maintain a patent airway Outcome: Progressing Goal: Complications related to the disease process, condition or treatment will be avoided or minimized Outcome: Progressing   Problem: Respiratory: Goal: Ability to maintain a clear airway and adequate ventilation will improve Outcome: Progressing   Problem: Role Relationship: Goal: Method of communication will improve Outcome: Progressing   Problem: Education: Goal: Knowledge of General Education information will improve Description: Including pain rating scale, medication(s)/side effects and non-pharmacologic comfort measures Outcome: Progressing   Problem: Health Behavior/Discharge Planning: Goal: Ability to manage health-related needs will improve Outcome: Progressing   Problem: Clinical Measurements: Goal: Ability to maintain clinical measurements within normal limits will improve Outcome: Progressing Goal: Will remain free from infection Outcome: Progressing Goal: Diagnostic test results will improve Outcome: Progressing Goal: Respiratory complications will improve Outcome: Progressing Goal: Cardiovascular complication will be avoided Outcome: Progressing   Problem: Activity: Goal: Risk for activity intolerance will decrease Outcome: Progressing   Problem: Nutrition: Goal: Adequate nutrition will be maintained Outcome: Progressing   Problem: Coping: Goal: Level of anxiety will decrease Outcome: Progressing   Problem: Elimination: Goal: Will not experience complications related to bowel motility Outcome: Progressing Goal: Will not experience complications related to urinary retention Outcome: Progressing   Problem: Pain Managment: Goal: General  experience of comfort will improve Outcome: Progressing   Problem: Safety: Goal: Ability to remain free from injury will improve Outcome: Progressing   Problem: Skin Integrity: Goal: Risk for impaired skin integrity will decrease Outcome: Progressing

## 2019-09-17 NOTE — Progress Notes (Signed)
ANTICOAGULATION CONSULT NOTE - Follow Up Consult  Pharmacy Consult for heparin Indication: ECMO  Labs: Recent Labs    09/16/19 1601 09/16/19 1608 09/16/19 2356 09/16/19 2356 09/17/19 0445 09/17/19 0454 09/17/19 1252 09/17/19 1252 09/17/19 1624 09/17/19 1626  HGB 9.3*   < > 9.3*   < > 8.8*   < > 9.9*   < > 9.4* 9.5*  HCT 32.9*   < > 33.3*   < > 31.4*   < > 29.0*  --  33.1* 28.0*  PLT 139*   < > 164  --  153  --   --   --  168  --   APTT 102*  --   --   --  77*  --   --   --  83*  --   HEPARINUNFRC 0.42  --   --   --  0.25*  --   --   --  0.36  --   CREATININE 1.16  --  1.08  --  1.13  --   --   --   --   --    < > = values in this interval not displayed.    Assessment: 44yo male on ECMO heparin drip 2000 uts/hr HL 0.36  No further bleeding  Goal of Therapy:  Heparin level 0.3-0.5 units/ml (aiming for lowest end)   Plan:  Continue heparin 2000 uts/hr  Check heparin  q12h  Monitor s/s bleeding  Barth Kirks, PharmD, BCPS, BCCCP Clinical Pharmacist [phone]  Please check AMION for all [place] numbers  09/17/2019 5:07 PM

## 2019-09-17 NOTE — Progress Notes (Signed)
Unaboots removed at this time with ortho at bedside. RN opt to leave them off at this time due to wounds on legs and feet. RN will float heels and monitor.

## 2019-09-17 NOTE — Progress Notes (Signed)
NAME:  [name], MRN:  [id number], DOB:  [birth year], LOS: 70 ADMISSION DATE:  07/22/2019, CONSULTATION DATE:  12/24 REFERRING MD:  Sloan Leiter, CHIEF COMPLAINT:  Dyspnea   Brief History   44 y/o M admitted 12/14 with COVID pneumonia causing acute hypoxemic respiratory failure. Developed pneumomediastinum 12/23 with concern for possible bilateral small pneumothoraces on 12/24.  PCCM consulted for evaluation of pneumothoraces  Past Medical History  GERD HTN Asthma  Significant Hospital Events   12/14 admit with hypoxemic respiratory failure in setting of COVID-19 pneumonia 12/23 noted to have pneumomediastinum on chest x-ray 12/24 PCCM consulted for evaluation 12/26 worsening hypoxemia, intubated  12/27 VV fem/fem ECMO cannulation See bedside nursing event notes for full rundown of procedures after 12/27 1/30 improved cxr, improved lung compliance, TV 350s on 15PC  1/31 CXR increased infiltrates, +CFB     2/2 Converted to right IJ Crescent cannula. 2/3 PEG tube inserted at bedside  Consults:  PCCM, CTS, CHF, Palliative  Significant Diagnostic Tests:   CT chest 12/22 >> extensive pneumomediastinum, multifocal patchy bilateral groundglass opacities CXR 1/25 >> improving bilateral infiltrates. ECMO cannulae in place. CXR 2/5 >> clearing bilateral infiltrates  Micro Data:  BCx2 12/14 >> negative  Sputum 12/15 >> negative  1/9 bronch: serratia and MSSA 1/2 acid fast smear bronch: negative 1/2 fungal cx bronch: pending 1/12 BAL: serratia 1/18 BAL >> rare Serratia. 2/1 BAL >> rare Serratia - few WBC  Antimicrobials:  Received remdesivir, steroids, actemra and convalescent plasma  5 days cefepime 12/24-12/29 Cefepime 1/4->1/14 vanc 1/4->1/14 Ceftriaxone 1/14 >>1/27 Cefazolin 1/28>> 02/2   Interim history/subjective:   Bronchoscopy yesterday showed only minimal secretions.  Bloody secretion since then.  Followed commands for nursing staff.  Objective   Blood pressure  (!) 173/60, pulse 92, temperature 98.6 F (37 C), temperature source Oral, resp. rate 18, height _0  (1.727 m), weight (!) 141.3 kg, SpO2 92 %. CVP:  [10 mmHg-22 mmHg] 12 mmHg  Vent Mode: PRVC FiO2 (%):  [50 %-60 %] 60 % Set Rate:  [15 bmp] 15 bmp PEEP:  [16 cmH20] 16 cmH20 Plateau Pressure:  [24 cmH20-34 cmH20] 24 cmH20   Intake/Output Summary (Last 24 hours) at 09/17/2019 0858 Last data filed at 09/17/2019 0800 Gross per 24 hour  Intake 7156.68 ml  Output 7050 ml  Net 106.68 ml   Filed Weights   09/15/19 0141 09/15/19 0500 09/17/19 0600  Weight: (!) 141.3 kg (!) 141.3 kg (!) 141.3 kg   Examination GEN: ill obese man on vent HEENT: trach in place, no further bleeding around tracheostomy but secretions bloody from tracheostomy CV: distant, ext warm PULM: improved vesicular breath sounds anteriorly, no wheezing. Bronchial breath sounds have receded to mid-axillary line.  Breath stacking has improved. Still abdominal breathing when awake. GI: Soft, +BS EXT: diffuse anasarca improved NEURO: opens eyes to voice, occasional coughing on vent. PSYCH: deferred SKIN: slightly pale.  Areas of skin breakdown from support devices.   CT head 1/28: pansinusitis. CT chest 1/28: Dense bilateral consolidation in dependent lungs both sides CT abdomen 1/28: Cannulae in position in IVC.  Flow (LPM): 4.99 ECMO Mode: VV ECMO Device: Cardiohelp  Vent Mode: PRVC, Set Rate: 15 bmp, FiO2 (%): 60 %, I Time: 0.8 Sec(s), PEEP: 16 cmH20 Vt today 317m  Net IO Since Admission: 49,820.8 mL [09/17/19 0858]  Resolved Hospital Problem list   MSSA and serratia marcescens pneumonia  Epistaxis Atrial fibrillation Assessment & Plan:   Acute hypoxemic respiratory failure with ARDS secondary  to COVID-19, status post tracheostomy on mechanical ventilation and VV ECMO support.  Slowly resolving ARDS. Limited respiratory reserve with persistently high burden of disease, in keeping with natural history of COVID  related ARDS.  Generally easier flow following cannula change. - No bronchoscopy today - Run lower heparin to allow bronchoscopy induced airway trauma to subside.  - Continue lung protective ventilation.  - Diuresis today to achieve -1 to -2 L negative fluid balance.  - Allow periods of more spontaneous breathing alternating with rest.  Allow for uninterrupted sleep at nighttime  Hypernatremia - improving.  - increased free water enteral and 0.45% saline. - metamucil to bulk stool to avoid free water loss in diarrhea.  Hypertension - Increase propranolol.  Daily Goals Checklist   Pain/Anxiety/Delirium protocol (if indicated): keep infusions off. Continue enteral agents. Dexmedetomidine and prn Versed and Dilaudid VAP protocol (if indicated): Bundle in place. Respiratory support goals: Pressure control ventilation with ultra protective lung ventilation. Titrate FiO2 down again. Continue PEEP 16  Blood pressure target:  MAP>65, off norepinephrine and IV amiodarone.  DVT prophylaxis: Remains on systemic heparin - target Heparin level 0.3-0.7 Nutrition Status: Nutrition Problem: Increased nutrient needs Etiology: acute illness(COVID PNA) Signs/Symptoms: estimated needs Interventions: Tube feeding, Prostat Aim for lower caloric goal.  Resume feeds at lower rate.  Maintain protein intake.   GI prophylaxis: Protonix per tube. Fluid status goals: diuresis today, restart furosemide infusion at 36m/h and titrate to urine output and CVP. Urinary catheter: Guide hemodynamic management Glucose control: Euglycemic control with SSI Mobility/therapy needs: Bedrest.  Daily labs: as per ECMO protocol. Code Status: Full code  Family Communication:family updated by RN. Disposition: ICU  Labs   CBC: CBC Latest Ref Rng & Units 09/17/2019 09/17/2019 09/16/2019  WBC 4.0 - 10.5 K/uL - 11.6(H) 13.7(H)  Hemoglobin 13.0 - 17.0 g/dL 9.2(L) 8.8(L) 9.3(L)  Hematocrit 39.0 - 52.0 % 27.0(L) 31.4(L) 33.3(L)   Platelets 150 - 400 K/uL - 153 1704   Basic Metabolic Panel: BMP Latest Ref Rng & Units 09/17/2019 09/17/2019 09/16/2019  Glucose 70 - 99 mg/dL - 133(H) 137(H)  BUN 6 - 20 mg/dL - 105(H) 103(H)  Creatinine 0.61 - 1.24 mg/dL - 1.13 1.08  Sodium 135 - 145 mmol/L 151(H) 149(H) 148(H)  Potassium 3.5 - 5.1 mmol/L 4.0 4.1 4.2  Chloride 98 - 111 mmol/L - 111 115(H)  CO2 22 - 32 mmol/L - 27 26  Calcium 8.9 - 10.3 mg/dL - 8.9 8.4(L)    GFR: Estimated Creatinine Clearance: 114 mL/min (by C-G formula based on SCr of 1.13 mg/dL). Recent Labs  Lab 09/14/19 0408 09/14/19 1620 09/15/19 0434 09/15/19 1621 09/16/19 0438 09/16/19 1601 09/16/19 2356 09/17/19 0445  WBC 9.6   < > 10.3   < > 9.8 11.6* 13.7* 11.6*  LATICACIDVEN 0.9  --  0.9  --  1.1  --   --  0.9   < > = values in this interval not displayed.    Liver Function Tests: No results for input(s): AST, ALT, ALKPHOS, BILITOT, PROT, ALBUMIN in the last 168 hours. No results for input(s): LIPASE, AMYLASE in the last 168 hours. No results for input(s): AMMONIA in the last 168 hours.  ABG    Component Value Date/Time   PHART 7.381 09/17/2019 0454   PCO2ART 51.4 (H) 09/17/2019 0454   PO2ART 71.0 (L) 09/17/2019 0454   HCO3 30.5 (H) 09/17/2019 0454   TCO2 32 09/17/2019 0454   ACIDBASEDEF 4.0 (H) 09/16/2019 2334   O2SAT 80.2  09/17/2019 0615     Coagulation Profile: No results for input(s): INR, PROTIME in the last 168 hours.  Cardiac Enzymes: No results for input(s): CKTOTAL, CKMB, CKMBINDEX, TROPONINI in the last 168 hours.  HbA1C: Hgb A1c MFr Bld  Date/Time Value Ref Range Status  07/24/2019 04:50 AM 6.7 (H) 4.8 - 5.6 % Final    Comment:    (NOTE) Pre diabetes:          5.7%-6.4% Diabetes:              >6.4% Glycemic control for   <7.0% adults with diabetes   09/30/2017 05:36 AM 5.7 (H) 4.8 - 5.6 % Final    Comment:    (NOTE)         Prediabetes: 5.7 - 6.4         Diabetes: >6.4         Glycemic control for adults with  diabetes: <7.0     CBG: Recent Labs  Lab 09/16/19 1604 09/16/19 1938 09/16/19 2331 09/17/19 0452 09/17/19 0747  GLUCAP 110* 98 107* 129* 64*     CRITICAL CARE Performed by: Kipp Brood   Total critical care time: 40 minutes  Critical care time was exclusive of separately billable procedures and treating other patients.  Critical care was necessary to treat or prevent imminent or life-threatening deterioration.  Critical care was time spent personally by me on the following activities: development of treatment plan with patient and/or surrogate as well as nursing, discussions with consultants, evaluation of patient's response to treatment, examination of patient, obtaining history from patient or surrogate, ordering and performing treatments and interventions, ordering and review of laboratory studies, ordering and review of radiographic studies, pulse oximetry, re-evaluation of patient's condition and participation in multidisciplinary rounds.  Kipp Brood, MD [facility] ICU Physician [place]  Pager: [phone] Mobile: [phone] After hours: [phone].

## 2019-09-17 NOTE — Progress Notes (Addendum)
Nutrition Follow up  DOCUMENTATION CODES:   Obesity unspecified  INTERVENTION:   Transition to bolus feedings:  -1 can Osmolite 1.5 six times daily  -60 ml Prostat QID -Juven BID -Free water flushes 500 ml Q6 hours- per CCM  Provides: 3090 kcals, 214 grams protein, 1086 ml free water (3086 ml with flushes). Meets 100% of needs.   NUTRITION DIAGNOSIS:   Increased nutrient needs related to acute illness(COVID PNA) as evidenced by estimated needs.  Ongoing  GOAL:   Patient will meet greater than or equal to 90% of their needs   Addressed via TF  MONITOR:   Diet advancement, Skin, TF tolerance, Weight trends, Labs, I & O's  REASON FOR ASSESSMENT:   Consult Enteral/tube feeding initiation and management  ASSESSMENT:   Patient with PMH significant for GERD, HTN, and asthma. Presents this admission with COVID-19 PNA.   12/14- admit 12/23- chest xray revealed pneumomediastinum  12/24- developed bilateral pneumothoraces  12/26- worsening hypoxemia, intubated  12/27- s/p VV fem/fem ECMO cannulation  12/29- s/p trach  1/5- nasal bleeding- packing per CCM 1/7- nasal packing per ENT 1/8- trach exchanged, ECMO circuit changed  1/20- s/p additional RIJ drainage cannula  1/21- Cortrak kinked, replaced by OGT, pt vomited over night 1/22- ECMO circuit exchanged  1/23- nasal endoscopy, control of epistaxis  2/2- TEE, Cortrak removed, change to crescent catheter, removal of femoral lines 2/3- s/p PEG, EGD  Pt discussed during ICU rounds and with RN.   VV ECMO day 14. Broch yesterday showed minimal secretions. Off pressors. Noted new stage II buttocks wound. Pt currently tolerating Pivot 1.5 @ 40 ml + 60 ml Prostat five times daily. Plan transition to bolus feedings- okay with CCM. Per RN stools are formed and have slowed. May consider d/c of metamucil with transition to bolus feedings. Add Juven to promote wound healing.   Remains hypernatremic. Free water flushes per CCM.  Continues on lasix. Vitamin C level wdl. Zinc d/c.   Patient requiring ventilator support via trach  MV: 5.1 L/min Temp (24hrs), Avg:98.9 F (37.2 C), Min:98.6 F (37 C), Max:99.2 F (37.3 C)  I/O: +14,449 ml since 1/26 UOP: 6,955 ml x 24 hrs  Stool: 250 ml x 24 hrs  Chest tubes: 40 ml x 24 hrs   Drips: 1/2NS @ 100 ml/hr, precedex, lasix 250 mg in D5 @ 8 ml/hr  Medications: 500 mg Vitamin C daily, SS novolog, metamucil Labs: Na 150 (H) BUN 105-trending up Phosphorus 4.7 (H)   Diet Order:   Diet Order    None      EDUCATION NEEDS:   Not appropriate for education at this time  Skin:  Skin Assessment: Skin Integrity Issues: Skin Integrity Issues:: DTI, Stage II, Stage III DTI: bilateral heels Stage II: buttocks Stage III: neck from trach ties Incisions: L wrist, nose Other: n/a  Last BM:  2/8  Height:   Ht Readings from Last 1 Encounters:  08/09/19 5' 8"  (1.727 m)    Weight:   Wt Readings from Last 1 Encounters:  09/17/19 (!) 141.3 kg    Ideal Body Weight:  70 kg  BMI:  Body mass index is 47.36 kg/m.  Estimated Nutritional Needs:   Kcal:  3710-6269 kcal (25-30 kcal/kg)  Protein:  180-215 grams  Fluid:  >/= 2.5 L/day   [name] RD, LDN Clinical Nutrition Pager # - [phone]

## 2019-09-17 NOTE — CV Procedure (Signed)
   ECMO NOTE:  Indication: Respiratory failure due to COVID PNA  Initial cannulation date: 08/03/2019  ECMO type: VV ECMO (Cardio-Help)  Dual lumen  Inflow/return cannula:  1) 32 FR Crescent placed 2/2  ECMO events:  Initial cannulation 12/26 Circuit changed and trach exchanged on 1/8.   Underwent addition of a RIJ drainage cannula on 1/20 Circuit changed on 1/22 for rising LDH Had evidence of recirculation and RIJ cannula pulled back on 1/26 19 FR RIJ removed 09/06/19 Underwent change to [facility] catheter with removal of femoral lines 2/2  Daily data:  Remains on VV ecmo with good flows.   Flow 4.97 RPM 3625 dP 34 Pven  -88 Sweep  9  SVO2 72%  Labs:  Hgb 9.2 LDH 297-> 365 Heparin level: 0.25 Lactic acid 0.9  Plan:  Crescent cannula working well Excellent flow and sats CXR worse today -> continue lasix gtt Continue to wean in 4 hour bursts then allow to rest with sedation.  Watch DTIs Possibility of CVVHD raised -> no indication at this point.  Glori Bickers, MD  8:34 AM

## 2019-09-17 NOTE — Consult Note (Signed)
[place] Nurse wound follow up Wound type: pressure injuries, bilateral lateral aspect of the foot Patient has been in tilt bed with weight positioned in the standing position for increased perfusion, his weight was sustained on the lateral aspects of the plantar foot bilaterally. Measurement: Left 19cm x 9cm x 0cm extends on the fourth metatarsal; 100% black hardened tissue, intact Right 16cm x 5.5cm x 0cm  Wound bed: intact skin Drainage (amount, consistency, odor) none Periwound:edematous  Dressing procedure/placement/frequency: No topical care recommended, may need vascular consultation  Would not recommend continued use of Unna's boot at this time, heart failure team can direct the further use however Would not recommend attempting standing/tilt because with increased pressure areas will extend. Prevalon boots to offload with wedge placed on the outside of the foot (wedge attached to Prevalon boot) Would not recommend debridement as long as the areas are stable and intact unless worsen at which time would need VVS to evaluate.   [place] Nurse team will follow with you and see patient within 10 days for wound assessments.  Please notify [place] nurses of any acute changes in the wounds or any new areas of concern [place] MSN, [place], [place], [place]

## 2019-09-18 ENCOUNTER — Inpatient Hospital Stay (HOSPITAL_COMMUNITY): Payer: Managed Care, Other (non HMO)

## 2019-09-18 LAB — POCT I-STAT 7, (LYTES, BLD GAS, ICA,H+H)
Acid-Base Excess: 5 mmol/L — ABNORMAL HIGH (ref 0.0–2.0)
Acid-Base Excess: 6 mmol/L — ABNORMAL HIGH (ref 0.0–2.0)
Acid-Base Excess: 9 mmol/L — ABNORMAL HIGH (ref 0.0–2.0)
Bicarbonate: 30.8 mmol/L — ABNORMAL HIGH (ref 20.0–28.0)
Bicarbonate: 31.7 mmol/L — ABNORMAL HIGH (ref 20.0–28.0)
Bicarbonate: 35.8 mmol/L — ABNORMAL HIGH (ref 20.0–28.0)
Calcium, Ion: 1.28 mmol/L (ref 1.15–1.40)
Calcium, Ion: 1.28 mmol/L (ref 1.15–1.40)
Calcium, Ion: 1.31 mmol/L (ref 1.15–1.40)
HCT: 29 % — ABNORMAL LOW (ref 39.0–52.0)
HCT: 29 % — ABNORMAL LOW (ref 39.0–52.0)
HCT: 29 % — ABNORMAL LOW (ref 39.0–52.0)
Hemoglobin: 9.9 g/dL — ABNORMAL LOW (ref 13.0–17.0)
Hemoglobin: 9.9 g/dL — ABNORMAL LOW (ref 13.0–17.0)
Hemoglobin: 9.9 g/dL — ABNORMAL LOW (ref 13.0–17.0)
O2 Saturation: 91 %
O2 Saturation: 94 %
O2 Saturation: 94 %
Patient temperature: 36.7
Patient temperature: 37
Patient temperature: 98.6
Potassium: 3.7 mmol/L (ref 3.5–5.1)
Potassium: 4 mmol/L (ref 3.5–5.1)
Potassium: 4.1 mmol/L (ref 3.5–5.1)
Sodium: 145 mmol/L (ref 135–145)
Sodium: 146 mmol/L — ABNORMAL HIGH (ref 135–145)
Sodium: 148 mmol/L — ABNORMAL HIGH (ref 135–145)
TCO2: 32 mmol/L (ref 22–32)
TCO2: 33 mmol/L — ABNORMAL HIGH (ref 22–32)
TCO2: 38 mmol/L — ABNORMAL HIGH (ref 22–32)
pCO2 arterial: 52.1 mmHg — ABNORMAL HIGH (ref 32.0–48.0)
pCO2 arterial: 53.1 mmHg — ABNORMAL HIGH (ref 32.0–48.0)
pCO2 arterial: 59.7 mmHg — ABNORMAL HIGH (ref 32.0–48.0)
pH, Arterial: 7.371 (ref 7.350–7.450)
pH, Arterial: 7.387 (ref 7.350–7.450)
pH, Arterial: 7.392 (ref 7.350–7.450)
pO2, Arterial: 62 mmHg — ABNORMAL LOW (ref 83.0–108.0)
pO2, Arterial: 74 mmHg — ABNORMAL LOW (ref 83.0–108.0)
pO2, Arterial: 76 mmHg — ABNORMAL LOW (ref 83.0–108.0)

## 2019-09-18 LAB — CBC
HCT: 32 % — ABNORMAL LOW (ref 39.0–52.0)
HCT: 32.9 % — ABNORMAL LOW (ref 39.0–52.0)
Hemoglobin: 9.1 g/dL — ABNORMAL LOW (ref 13.0–17.0)
Hemoglobin: 9.4 g/dL — ABNORMAL LOW (ref 13.0–17.0)
MCH: 27.6 pg (ref 26.0–34.0)
MCH: 27.2 pg (ref 26.0–34.0)
MCHC: 28.4 g/dL — ABNORMAL LOW (ref 30.0–36.0)
MCHC: 28.6 g/dL — ABNORMAL LOW (ref 30.0–36.0)
MCV: 97 fL (ref 80.0–100.0)
MCV: 95.4 fL (ref 80.0–100.0)
Platelets: 163 10*3/uL (ref 150–400)
Platelets: 190 10*3/uL (ref 150–400)
RBC: 3.3 MIL/uL — ABNORMAL LOW (ref 4.22–5.81)
RBC: 3.45 MIL/uL — ABNORMAL LOW (ref 4.22–5.81)
RDW: 17.8 % — ABNORMAL HIGH (ref 11.5–15.5)
RDW: 17.8 % — ABNORMAL HIGH (ref 11.5–15.5)
WBC: 10.5 10*3/uL (ref 4.0–10.5)
WBC: 11 10*3/uL — ABNORMAL HIGH (ref 4.0–10.5)
nRBC: 0.2 % (ref 0.0–0.2)
nRBC: 0 % (ref 0.0–0.2)

## 2019-09-18 LAB — FIBRINOGEN: Fibrinogen: 635 mg/dL — ABNORMAL HIGH (ref 210–475)

## 2019-09-18 LAB — GLUCOSE, CAPILLARY
Glucose-Capillary: 105 mg/dL — ABNORMAL HIGH (ref 70–99)
Glucose-Capillary: 112 mg/dL — ABNORMAL HIGH (ref 70–99)
Glucose-Capillary: 125 mg/dL — ABNORMAL HIGH (ref 70–99)
Glucose-Capillary: 136 mg/dL — ABNORMAL HIGH (ref 70–99)
Glucose-Capillary: 162 mg/dL — ABNORMAL HIGH (ref 70–99)
Glucose-Capillary: 166 mg/dL — ABNORMAL HIGH (ref 70–99)

## 2019-09-18 LAB — LACTIC ACID, PLASMA: Lactic Acid, Venous: 1.1 mmol/L (ref 0.5–1.9)

## 2019-09-18 LAB — BASIC METABOLIC PANEL
Anion gap: 5 (ref 5–15)
Anion gap: 11 (ref 5–15)
BUN: 102 mg/dL — ABNORMAL HIGH (ref 6–20)
BUN: 105 mg/dL — ABNORMAL HIGH (ref 6–20)
CO2: 31 mmol/L (ref 22–32)
CO2: 31 mmol/L (ref 22–32)
Calcium: 8.7 mg/dL — ABNORMAL LOW (ref 8.9–10.3)
Calcium: 9 mg/dL (ref 8.9–10.3)
Chloride: 107 mmol/L (ref 98–111)
Chloride: 104 mmol/L (ref 98–111)
Creatinine, Ser: 1.01 mg/dL (ref 0.61–1.24)
Creatinine, Ser: 1.16 mg/dL (ref 0.61–1.24)
GFR calc Af Amer: >60 mL/min (ref >60)
GFR calc Af Amer: >60 mL/min (ref >60)
GFR calc non Af Amer: >60 mL/min (ref >60)
GFR calc non Af Amer: >60 mL/min (ref >60)
Glucose, Bld: 120 mg/dL — ABNORMAL HIGH (ref 70–99)
Glucose, Bld: 153 mg/dL — ABNORMAL HIGH (ref 70–99)
Potassium: 4 mmol/L (ref 3.5–5.1)
Potassium: 4.1 mmol/L (ref 3.5–5.1)
Sodium: 143 mmol/L (ref 135–145)
Sodium: 146 mmol/L — ABNORMAL HIGH (ref 135–145)

## 2019-09-18 LAB — LACTATE DEHYDROGENASE: LDH: 384 U/L — ABNORMAL HIGH (ref 98–192)

## 2019-09-18 LAB — COOXEMETRY PANEL
Carboxyhemoglobin: 3 % — ABNORMAL HIGH (ref 0.5–1.5)
Methemoglobin: 0.6 % (ref 0.0–1.5)
O2 Saturation: 78.9 %
Total hemoglobin: 11.4 g/dL — ABNORMAL LOW (ref 12.0–16.0)

## 2019-09-18 LAB — HEPARIN LEVEL (UNFRACTIONATED)
Heparin Unfractionated: 0.62 IU/mL (ref 0.30–0.70)
Heparin Unfractionated: 0.25 IU/mL — ABNORMAL LOW (ref 0.30–0.70)
Heparin Unfractionated: 0.27 IU/mL — ABNORMAL LOW (ref 0.30–0.70)

## 2019-09-18 LAB — PHOSPHORUS: Phosphorus: 4.4 mg/dL (ref 2.5–4.6)

## 2019-09-18 LAB — APTT
aPTT: 119 seconds — ABNORMAL HIGH (ref 24–36)
aPTT: 68 seconds — ABNORMAL HIGH (ref 24–36)

## 2019-09-18 LAB — MAGNESIUM: Magnesium: 1.8 mg/dL (ref 1.7–2.4)

## 2019-09-18 MED ORDER — DIAZEPAM 5 MG PO TABS: 10.0000 mg | ORAL_TABLET | Freq: Four times a day (QID) | ORAL | Status: DC

## 2019-09-18 MED ORDER — FREE WATER
300.0000 mL | Freq: Four times a day (QID) | Status: DC
  Administered 2019-09-18 – 2019-09-20 (×8): 300 mL

## 2019-09-18 MED ORDER — LIDOCAINE HCL (PF) 1 % IJ SOLN: 30.0000 mL | Freq: Once | INTRAMUSCULAR | Status: AC

## 2019-09-18 MED ORDER — HYDROMORPHONE HCL 1 MG/ML IJ SOLN: 2.0000 mg | INTRAMUSCULAR | Status: DC | PRN

## 2019-09-18 MED ORDER — LIDOCAINE HCL (PF) 1 % IJ SOLN
INTRAMUSCULAR | Status: AC
  Administered 2019-09-18: 2 mL
  Filled 2019-09-18: qty 30

## 2019-09-18 MED ORDER — DIAZEPAM 5 MG PO TABS
15.0000 mg | ORAL_TABLET | Freq: Four times a day (QID) | ORAL | Status: DC
  Administered 2019-09-18 – 2019-09-21 (×11): 15 mg via ORAL
  Filled 2019-09-18 (×11): qty 3

## 2019-09-18 MED ORDER — MIDAZOLAM HCL 2 MG/2ML IJ SOLN: 2.0000 mg | Freq: Once | INTRAMUSCULAR | Status: AC

## 2019-09-18 MED ORDER — MIDAZOLAM HCL 2 MG/2ML IJ SOLN
INTRAMUSCULAR | Status: AC
  Administered 2019-09-18: 2 mg via INTRAVENOUS
  Filled 2019-09-18: qty 2

## 2019-09-18 MED ORDER — HYDROMORPHONE HCL 1 MG/ML IJ SOLN
INTRAMUSCULAR | Status: AC
  Administered 2019-09-18: 2 mg via INTRAVENOUS
  Filled 2019-09-18: qty 2

## 2019-09-18 MED ORDER — METOLAZONE 5 MG PO TABS
5.0000 mg | ORAL_TABLET | Freq: Once | ORAL | Status: AC
  Administered 2019-09-18: 5 mg via ORAL
  Filled 2019-09-18: qty 1

## 2019-09-18 MED ORDER — ACETAZOLAMIDE 250 MG PO TABS
250.0000 mg | ORAL_TABLET | Freq: Two times a day (BID) | ORAL | Status: DC
  Administered 2019-09-18 – 2019-09-20 (×4): 250 mg
  Filled 2019-09-18 (×4): qty 1

## 2019-09-18 MED ORDER — MAGNESIUM SULFATE 2 GM/50ML IV SOLN
2.0000 g | Freq: Once | INTRAVENOUS | Status: AC
  Administered 2019-09-18: 2 g via INTRAVENOUS
  Filled 2019-09-18: qty 50

## 2019-09-18 MED ORDER — HYDROMORPHONE HCL 1 MG/ML IJ SOLN: 2.0000 mg | Freq: Once | INTRAMUSCULAR | Status: AC

## 2019-09-18 NOTE — Progress Notes (Signed)
ANTICOAGULATION CONSULT NOTE - Follow Up Consult  Pharmacy Consult for heparin Indication: ECMO  Labs: Recent Labs    09/17/19 0445 09/17/19 0454 09/17/19 1624 09/17/19 1624 09/17/19 1626 09/17/19 1626 09/17/19 2358 09/18/19 0334  HGB 8.8*   < > 9.4*   < > 9.5*   < > 9.9* 9.1*  HCT 31.4*   < > 33.1*   < > 28.0*  --  29.0* 32.0*  PLT 153  --  168  --   --   --   --  163  APTT 77*  --  83*  --   --   --   --  119*  HEPARINUNFRC 0.25*  --  0.36  --   --   --   --  0.62  CREATININE 1.13  --  1.13  --   --   --   --  1.01   < > = values in this interval not displayed.    Assessment: 44yo male on ECMO heparin drip 2000 uts/hr. Heparin level up to 0.62 (supratherapeutic). Trended up over past 24 hours. RN notes pt still with some bleeding from mouth.  Goal of Therapy:  Heparin level 0.3-0.5 units/ml (aiming for lowest end)   Plan:  Decrease heparin to 1850 uts/hr  Check heparin level in 6 hours Monitor s/s bleeding  Sherlon Handing, PharmD, BCPS Please see amion for complete clinical pharmacist phone list 09/18/2019 4:49 AM

## 2019-09-18 NOTE — CV Procedure (Signed)
   ECMO NOTE:  Indication: Respiratory failure due to COVID PNA  Initial cannulation date: 08/03/2019  ECMO type: VV ECMO (Cardio-Help)  Dual lumen  Inflow/return cannula:  1) 32 FR Crescent placed 2/2  ECMO events:  Initial cannulation 12/26 Circuit changed and trach exchanged on 1/8.   Underwent addition of a RIJ drainage cannula on 1/20 Circuit changed on 1/22 for rising LDH Had evidence of recirculation and RIJ cannula pulled back on 1/26 19 FR RIJ removed 09/06/19 Underwent change to [facility] catheter with removal of femoral lines 2/2  Daily data:  Remains on VV ecmo with good flows.    Flow 4.84 RPM 3625 dP 35 Pven  -91 Sweep  9 -> 11.5  SVO2 74%  Labs:  Hgb 9.1 LDH 297-> 365 -> 384 Heparin level: 0.62 Lactic acid 1.1  Plan:  Crescent cannula working well Excellent flow and sats CXR worse today -> continue lasix gtt. Bronch today Continue to wean in 4 hour bursts then allow to rest with sedation.  Watch DTIs Possibility of CVVHD raised -> no indication at this point. - Watch LDH. Suspect may need circuit change in next few days  Glori Bickers, MD  8:34 AM

## 2019-09-18 NOTE — Progress Notes (Signed)
ANTICOAGULATION CONSULT NOTE - Follow Up Consult  Pharmacy Consult for heparin Indication: ECMO  Labs: Recent Labs    09/17/19 0445 09/17/19 0454 09/17/19 1624 09/17/19 1626 09/17/19 2358 09/17/19 2358 09/18/19 0334 09/18/19 0629 09/18/19 0734  HGB 8.8*   < > 9.4*   < > 9.9*   < > 9.1* 9.9*  --   HCT 31.4*   < > 33.1*   < > 29.0*  --  32.0* 29.0*  --   PLT 153  --  168  --   --   --  163  --   --   APTT 77*  --  83*  --   --   --  119*  --   --   HEPARINUNFRC 0.25*   < > 0.36  --   --   --  0.62  --  0.25*  CREATININE 1.13  --  1.13  --   --   --  1.01  --   --    < > = values in this interval not displayed.    Assessment: 44yo male on ECMO heparin drip 2000 uts/hr. Heparin level up to 0.62 - drawn from CL so redrawn from [place] -  0.25 low end of goal. H/h stable LDH slight bump  pt still with some bleeding from mouth 2/9 but improved and no bleeding on bronch 2/10   Goal of Therapy:  Heparin level 0.3-0.5 units/ml (aiming for lowest end)   Plan:  heparin to 1900 uts/hr  Check heparin level q12h Monitor s/s bleeding  Bonnita Nasuti Pharm.D. CPP, BCPS Clinical Pharmacist [phone] 09/18/2019 10:10 AM

## 2019-09-18 NOTE — Progress Notes (Signed)
ANTICOAGULATION CONSULT NOTE - Follow Up Consult  Pharmacy Consult for heparin Indication: ECMO  Labs: Recent Labs    09/17/19 1624 09/17/19 1626 09/18/19 0334 09/18/19 0334 09/18/19 0629 09/18/19 0734 09/18/19 1619  HGB 9.4*   < > 9.1*   < > 9.9*  --  9.4*  9.9*  HCT 33.1*   < > 32.0*  --  29.0*  --  32.9*  29.0*  PLT 168  --  163  --   --   --  190  APTT 83*  --  119*  --   --   --  68*  HEPARINUNFRC 0.36   < > 0.62  --   --  0.25* 0.27*  CREATININE 1.13  --  1.01  --   --   --  1.16   < > = values in this interval not displayed.    Assessment: 49 YOM continues on IV heparin while on ECMO.  Heparin level is slightly below goal.  Patient still with some bleeding from mouth 2/9 but improved and no bleeding on bronch 09/18/19  Goal of Therapy:  Heparin level 0.3-0.5 units/ml (aiming for lowest end)   Plan:  Increase heparin infusion back to 2000 units/hr Continue Q12H heparin level Monitor closely for s/sx of bleeding  Shahrzad Koble D. Mina Marble, PharmD, BCPS, [place] 09/18/2019, 5:39 PM

## 2019-09-18 NOTE — Progress Notes (Signed)
PT Cancellation Note  Patient Details Name: [name] MRN: [id number] DOB: [birth year]   Cancelled Treatment:    Reason Eval/Treat Not Completed: Patient not medically ready. Medical team intermittently weaning sedation during the day. Pt unable to consistently follow commands at this time, able to attempt to open mouth on command and intermittent flickering of finger movement to command, however still at roughly 10% consistency of following motor commands. PT will attempt to follow up on Friday for continued treatment to see if patient is more alert and better able to follow commands and participate in skilled PT intervention.   Zenaida Niece 09/18/2019, 5:07 PM

## 2019-09-18 NOTE — Progress Notes (Signed)
35 mls IV dilaudid drip wasted with Penny Pia, RN.

## 2019-09-18 NOTE — Plan of Care (Signed)
  Problem: Education: Goal: Knowledge of risk factors and measures for prevention of condition will improve Outcome: Progressing   Problem: Coping: Goal: Psychosocial and spiritual needs will be supported Outcome: Progressing   Problem: Respiratory: Goal: Will maintain a patent airway Outcome: Progressing Goal: Complications related to the disease process, condition or treatment will be avoided or minimized Outcome: Progressing   Problem: Respiratory: Goal: Ability to maintain a clear airway and adequate ventilation will improve Outcome: Progressing   Problem: Role Relationship: Goal: Method of communication will improve Outcome: Progressing   Problem: Education: Goal: Knowledge of General Education information will improve Description: Including pain rating scale, medication(s)/side effects and non-pharmacologic comfort measures Outcome: Progressing   Problem: Health Behavior/Discharge Planning: Goal: Ability to manage health-related needs will improve Outcome: Progressing   Problem: Clinical Measurements: Goal: Ability to maintain clinical measurements within normal limits will improve Outcome: Progressing Goal: Will remain free from infection Outcome: Progressing Goal: Diagnostic test results will improve Outcome: Progressing Goal: Respiratory complications will improve Outcome: Progressing Goal: Cardiovascular complication will be avoided Outcome: Progressing   Problem: Activity: Goal: Risk for activity intolerance will decrease Outcome: Progressing   Problem: Nutrition: Goal: Adequate nutrition will be maintained Outcome: Progressing   Problem: Coping: Goal: Level of anxiety will decrease Outcome: Progressing   Problem: Elimination: Goal: Will not experience complications related to bowel motility Outcome: Progressing Goal: Will not experience complications related to urinary retention Outcome: Progressing   Problem: Pain Managment: Goal: General  experience of comfort will improve Outcome: Progressing   Problem: Safety: Goal: Ability to remain free from injury will improve Outcome: Progressing   Problem: Skin Integrity: Goal: Risk for impaired skin integrity will decrease Outcome: Progressing

## 2019-09-18 NOTE — Progress Notes (Signed)
Assisted CCM MD for bedside bronch. No complications were noted.

## 2019-09-18 NOTE — Progress Notes (Signed)
Patient ID: [name], male   DOB: [birth year], 44 y.o.   MRN: [id number]    Advanced Heart Failure Rounding Note   Subjective:    Remains on VV ecmo with good flows.   Sedated on Precedex and po dilaudid but will arouse  CVP 12  Sweep 9 -> 11.5  Had a good night pulling TVs > 400. THis morning sedated again. Had more coughing and TVs down ~200. Coughed up a few clots overnight.   ABG 7.39/52/62/91%  Co-ox 79%   On vent at 60% on PC.TVs ~250cc currently (higher overnight)  On lasix gtt at 8 with good output. Weight unchanged  CXR with diffuse bilateral infiltrates slightly worse on left today. Crescent catheter well positioned  Personally reviewed   hgb 9.1 wbc10.5. Sodium 155-> 151 -> 143  ECMO parameters - see separate ecmo note   Objective:   Weight Range:  Vital Signs:   Temp:  [98.9 F (37.2 C)] 98.9 F (37.2 C) (02/09 1130) Pulse Rate:  [75-94] 80 (02/10 0725) Resp:  [0-21] 15 (02/10 0725) BP: (152-183)/(60-66) 169/60 (02/10 0725) SpO2:  [91 %-97 %] 92 % (02/10 0725) Arterial Line BP: (140-188)/(53-72) 164/60 (02/10 0630) FiO2 (%):  [45 %-60 %] 60 % (02/10 0725) Last BM Date: 09/17/19  Weight change: Filed Weights   09/15/19 0141 09/15/19 0500 09/17/19 0600  Weight: (!) 141.3 kg (!) 141.3 kg (!) 141.3 kg    Intake/Output:   Intake/Output Summary (Last 24 hours) at 09/18/2019 0754 Last data filed at 09/18/2019 0600 Gross per 24 hour  Intake 6016.36 ml  Output 7630 ml  Net -1613.64 ml     Physical Exam: General: Sedated on vent Will arouse to voice HEENT: normal DTI on occiput Neck: supple. RIJ crescent Carotids 2+ bilat; no bruits. No lymphadenopathy or thryomegaly appreciated. Cor: PMI nondisplaced. Regular rate & rhythm. No rubs, gallops or murmurs. Lungs: clear Abdomen: soft, nontender, nondistended. No hepatosplenomegaly. No bruits or masses. Good bowel sounds. Extremities: no cyanosis, clubbing, rash, 2-3+ edema +DTIs on feet Neuro:  sedated will arouse   Telemetry: Sinus 80-90s  Personally reviewed  Labs: Basic Metabolic Panel: Recent Labs  Lab 09/16/19 1601 09/16/19 1608 09/16/19 2356 09/16/19 2356 09/17/19 0445 09/17/19 0454 09/17/19 1624 09/17/19 1626 09/17/19 2358 09/18/19 0334 09/18/19 0629  NA 151*   < > 148*   < > 149*   < > 150* 147* 148* 143 145  K 4.7   < > 4.2   < > 4.1   < > 3.9 3.7 4.0 4.0 3.7  CL 111  --  115*  --  111  --  110  --   --  107  --   CO2 27  --  26  --  27  --  29  --   --  31  --   GLUCOSE 118*  --  137*  --  133*  --  137*  --   --  120*  --   BUN 103*  --  103*  --  105*  --  102*  --   --  102*  --   CREATININE 1.16  --  1.08  --  1.13  --  1.13  --   --  1.01  --   CALCIUM 9.1   < > 8.4*   < > 8.9  --  9.0  --   --  8.7*  --   MG  --   --   --   --  2.0  --   --   --   --  1.8  --   PHOS  --   --   --   --  4.7*  --   --   --   --  4.4  --    < > = values in this interval not displayed.    Liver Function Tests: No results for input(s): AST, ALT, ALKPHOS, BILITOT, PROT, ALBUMIN in the last 168 hours. No results for input(s): LIPASE, AMYLASE in the last 168 hours. No results for input(s): AMMONIA in the last 168 hours.  CBC: Recent Labs  Lab 09/16/19 1601 09/16/19 1608 09/16/19 2356 09/16/19 2356 09/17/19 0445 09/17/19 0454 09/17/19 1624 09/17/19 1626 09/17/19 2358 09/18/19 0334 09/18/19 0629  WBC 11.6*  --  13.7*  --  11.6*  --  12.3*  --   --  10.5  --   HGB 9.3*   < > 9.3*   < > 8.8*   < > 9.4* 9.5* 9.9* 9.1* 9.9*  HCT 32.9*   < > 33.3*   < > 31.4*   < > 33.1* 28.0* 29.0* 32.0* 29.0*  MCV 97.3  --  99.4  --  98.7  --  97.4  --   --  97.0  --   PLT 139*  --  164  --  153  --  168  --   --  163  --    < > = values in this interval not displayed.    Cardiac Enzymes: No results for input(s): CKTOTAL, CKMB, CKMBINDEX, TROPONINI in the last 168 hours.  BNP: BNP (last 3 results) Recent Labs    07/22/19 1039  BNP 15.3    ProBNP (last 3 results) No  results for input(s): PROBNP in the last 8760 hours.    Other results:  Imaging: DG CHEST PORT 1 VIEW  Result Date: 09/17/2019 CLINICAL DATA:  Respiratory failure EXAM: PORTABLE CHEST 1 VIEW COMPARISON:  September 16, 2019 FINDINGS: Tracheostomy catheter tip is 4.8 cm above the carina. There are chest tubes on each side, unchanged in position. There is and extracorporeal membrane oxygenation catheter extending from the right jugular vein to the level of the inferior vena cava. There is a central catheter with tip in superior vena cava. No evident pneumothorax. There is widespread airspace opacity bilaterally, likely primarily due to edema. There is cardiomegaly with pulmonary venous hypertension. No adenopathy. No bone lesions. IMPRESSION: Tube and catheter positions as described without evident pneumothorax. Cardiomegaly with pulmonary vascular congestion noted. Widespread airspace opacity, likely edema. Superimposed pneumonia cannot be excluded. Appearance similar to 1 day prior. Electronically Signed   By: Lowella Grip III M.D.   On: 09/17/2019 08:57     Medications:     Scheduled Medications: . amiodarone  200 mg Oral Daily  . vitamin C  500 mg Per Tube Daily  . chlorhexidine gluconate (MEDLINE KIT)  15 mL Mouth Rinse BID  . Chlorhexidine Gluconate Cloth  6 each Topical Daily  . diazepam  20 mg Per Tube Q6H  . feeding supplement (OSMOLITE 1.5 CAL)  237 mL Per Tube 6 X Daily  . feeding supplement (PRO-STAT SUGAR FREE 64)  60 mL Per Tube QID  . free water  500 mL Per Tube Q6H  . gabapentin  200 mg Per Tube Q8H  . hydrALAZINE  10 mg Per Tube Q8H  . HYDROmorphone  16 mg Per Tube Q4H  . insulin aspart  0-20 Units Subcutaneous  Q4H  . ipratropium-albuterol  3 mL Nebulization Q4H  . mouth rinse  15 mL Mouth Rinse 10 times per day  . midazolam  2 mg Intravenous Once  . montelukast  10 mg Per Tube QHS  . nutrition supplement (JUVEN)  1 packet Per Tube BID BM  . oxymetazoline  1 spray  Each Nare BID  . pantoprazole sodium  40 mg Per Tube QHS  . propranolol  40 mg Per Tube TID  . psyllium  1 packet Oral BID  . QUEtiapine  50 mg Per Tube BID  . sodium chloride flush  10-40 mL Intracatheter Q12H  . white petrolatum   Topical BID    Infusions: . sodium chloride Stopped (09/18/19 0532)  . sodium chloride    . sodium chloride Stopped (09/05/19 1900)  . sodium chloride 10 mL/hr at 09/11/19 2156  . albumin human 12.5 g (09/13/19 0049)  . dexmedetomidine (PRECEDEX) IV infusion 0.4 mcg/kg/hr (09/18/19 0743)  . furosemide (LASIX) infusion 8 mg/hr (09/18/19 0700)  . heparin 1,850 Units/hr (09/18/19 0600)  . HYDROmorphone Stopped (09/17/19 1059)  . midazolam Stopped (09/16/19 0730)  . norepinephrine Stopped (09/16/19 0746)    PRN Medications: Place/Maintain arterial line **AND** sodium chloride, sodium chloride, sodium chloride, acetaminophen (TYLENOL) oral liquid 160 mg/5 mL, acetaminophen, albumin human, hydrALAZINE, HYDROmorphone, magic mouthwash, magnesium hydroxide, midazolam, ondansetron (ZOFRAN) IV, senna-docusate, sodium chloride   Assessment/Plan:    1. Acute hypoxic/hypercarbic respiratory failure due to COVID PNA: Remained markedly hypoxic despite full support. Intubated 12/26. S/p bilateral CTs 12/26 for pneumothoraces.  TEE on 12/26 LVEF 70% RV ok. VV ECMO begun 12/26. Tracheostomy 12/29.  COVID ARDS at this point. ECMO circuit changed 1/8 & 1/23. Crescent cannula placed 2/2 (femoral cannuals removed) - ECMO parameters as per ECMO note - Flows look  good on Crescent. CXR slightly worse today. Sweep up. Continue lasix gtt. Repeat bronch today. D/w CCM - LDH 289-> 365 -> 384  - Continue heparin. No bleeding. Discussed dosing with PharmD personally. - Sodium high. Continues to 3rd space. Continue IV lasix - Continue chest PT - Continue regular episodes of awakening and letting him work interspersed with sedation and rest periods.  2. Bilateral PTX: Due to  barotrauma.  - Resolved  3. COVID PNA: management of COVID infection per CCM. CXR with bilateral multifocal PNA progressing to likely ARDS.  - s/p 10 days of remdesivir - 12/16, 12/2 s/p tociluzimab - 12/17 s/p convalescent plasma - Decadron at 66m/daily completed 1/11 - Completed cefepime/vanc - Off Ancef  4. ID: Empiric coverage with vancomycin/cefepime initially for possible secondary bacterial PNA, course completed.   - treated with cefepime and vanc for + cx with staph epidimidis, MSSA and serratia (sensitivities ok). - Currently off abx - BAL cx. With serratia and enterococcus felt to be colonization - Watch cx. Low threshold to add back abx. - WBC ok. Afebrile  5. Thrombocytopenia: Slow decrease in platelets, likely low level hemolysis + critical illness.  - resolved. Platelets 163  today  6. Anemia: - hgb 9.1 today. Transfuse as needed to keep Hgb> 8.0  7. FEN - now with G-tube. Resume TF  8. Hypernatremia - NA 155 -> 143 - Continue FW at current rate. Hopefully can decrease slightly soon   9. AKI - Back on lasix gtt with good output  - he is 3rd spacing. Continue unna boots.  - Creatinine improved BUN > 100 -  Possibility of CVVHD discussed extensively on rounds today. No current indication.  10. DTIs - WOC following.    D/w with ECMO team (ECMO coordinator/specialists, CCM, TCTS and pharmD) at bedside on multidisciplinary rounds. Continue to await ARDS recovery. Details as above.   Length of Stay: 31  CRITICAL CARE Performed by: Glori Bickers  Total critical care time: 35  minutes  Critical care time was exclusive of separately billable procedures and treating other patients.  Critical care was necessary to treat or prevent imminent or life-threatening deterioration.  Critical care was time spent personally by me (independent of midlevel providers or residents) on the following activities: development of treatment plan with patient and/or surrogate  as well as nursing, discussions with consultants, evaluation of patient's response to treatment, examination of patient, obtaining history from patient or surrogate, ordering and performing treatments and interventions, ordering and review of laboratory studies, ordering and review of radiographic studies, pulse oximetry and re-evaluation of patient's condition.    Glori Bickers MD 09/18/2019, 7:54 AM  Advanced Heart Failure Team Pager [phone] (M-F; 7a - 4p)  Please contact [facility] for night-coverage after hours (4p -7a ) and weekends on [email or web address]

## 2019-09-19 ENCOUNTER — Inpatient Hospital Stay (HOSPITAL_COMMUNITY): Payer: Managed Care, Other (non HMO)

## 2019-09-19 LAB — GLUCOSE, CAPILLARY
Glucose-Capillary: 116 mg/dL — ABNORMAL HIGH (ref 70–99)
Glucose-Capillary: 115 mg/dL — ABNORMAL HIGH (ref 70–99)
Glucose-Capillary: 148 mg/dL — ABNORMAL HIGH (ref 70–99)
Glucose-Capillary: 170 mg/dL — ABNORMAL HIGH (ref 70–99)
Glucose-Capillary: 168 mg/dL — ABNORMAL HIGH (ref 70–99)

## 2019-09-19 LAB — POCT I-STAT 7, (LYTES, BLD GAS, ICA,H+H)
Acid-Base Excess: 12 mmol/L — ABNORMAL HIGH (ref 0.0–2.0)
Acid-Base Excess: 13 mmol/L — ABNORMAL HIGH (ref 0.0–2.0)
Acid-Base Excess: 13 mmol/L — ABNORMAL HIGH (ref 0.0–2.0)
Acid-Base Excess: 13 mmol/L — ABNORMAL HIGH (ref 0.0–2.0)
Bicarbonate: 37.6 mmol/L — ABNORMAL HIGH (ref 20.0–28.0)
Bicarbonate: 38.7 mmol/L — ABNORMAL HIGH (ref 20.0–28.0)
Bicarbonate: 39.4 mmol/L — ABNORMAL HIGH (ref 20.0–28.0)
Bicarbonate: 38.9 mmol/L — ABNORMAL HIGH (ref 20.0–28.0)
Calcium, Ion: 1.28 mmol/L (ref 1.15–1.40)
Calcium, Ion: 1.25 mmol/L (ref 1.15–1.40)
Calcium, Ion: 1.24 mmol/L (ref 1.15–1.40)
Calcium, Ion: 1.24 mmol/L (ref 1.15–1.40)
HCT: 29 % — ABNORMAL LOW (ref 39.0–52.0)
HCT: 33 % — ABNORMAL LOW (ref 39.0–52.0)
HCT: 32 % — ABNORMAL LOW (ref 39.0–52.0)
HCT: 30 % — ABNORMAL LOW (ref 39.0–52.0)
Hemoglobin: 9.9 g/dL — ABNORMAL LOW (ref 13.0–17.0)
Hemoglobin: 11.2 g/dL — ABNORMAL LOW (ref 13.0–17.0)
Hemoglobin: 10.9 g/dL — ABNORMAL LOW (ref 13.0–17.0)
Hemoglobin: 10.2 g/dL — ABNORMAL LOW (ref 13.0–17.0)
O2 Saturation: 94 %
O2 Saturation: 95 %
O2 Saturation: 95 %
O2 Saturation: 97 %
Patient temperature: 36.9
Patient temperature: 37.2
Patient temperature: 37.5
Patient temperature: 37.3
Potassium: 3.8 mmol/L (ref 3.5–5.1)
Potassium: 4.1 mmol/L (ref 3.5–5.1)
Potassium: 4.4 mmol/L (ref 3.5–5.1)
Potassium: 4.3 mmol/L (ref 3.5–5.1)
Sodium: 146 mmol/L — ABNORMAL HIGH (ref 135–145)
Sodium: 144 mmol/L (ref 135–145)
Sodium: 143 mmol/L (ref 135–145)
Sodium: 142 mmol/L (ref 135–145)
TCO2: 39 mmol/L — ABNORMAL HIGH (ref 22–32)
TCO2: 40 mmol/L — ABNORMAL HIGH (ref 22–32)
TCO2: 41 mmol/L — ABNORMAL HIGH (ref 22–32)
TCO2: 41 mmol/L — ABNORMAL HIGH (ref 22–32)
pCO2 arterial: 56.6 mmHg — ABNORMAL HIGH (ref 32.0–48.0)
pCO2 arterial: 55.1 mmHg — ABNORMAL HIGH (ref 32.0–48.0)
pCO2 arterial: 61.8 mmHg — ABNORMAL HIGH (ref 32.0–48.0)
pCO2 arterial: 56.1 mmHg — ABNORMAL HIGH (ref 32.0–48.0)
pH, Arterial: 7.43 (ref 7.350–7.450)
pH, Arterial: 7.455 — ABNORMAL HIGH (ref 7.350–7.450)
pH, Arterial: 7.415 (ref 7.350–7.450)
pH, Arterial: 7.45 (ref 7.350–7.450)
pO2, Arterial: 69 mmHg — ABNORMAL LOW (ref 83.0–108.0)
pO2, Arterial: 74 mmHg — ABNORMAL LOW (ref 83.0–108.0)
pO2, Arterial: 82 mmHg — ABNORMAL LOW (ref 83.0–108.0)
pO2, Arterial: 92 mmHg (ref 83.0–108.0)

## 2019-09-19 LAB — BASIC METABOLIC PANEL
Anion gap: 11 (ref 5–15)
Anion gap: 11 (ref 5–15)
BUN: 114 mg/dL — ABNORMAL HIGH (ref 6–20)
BUN: 117 mg/dL — ABNORMAL HIGH (ref 6–20)
CO2: 34 mmol/L — ABNORMAL HIGH (ref 22–32)
CO2: 34 mmol/L — ABNORMAL HIGH (ref 22–32)
Calcium: 9.1 mg/dL (ref 8.9–10.3)
Calcium: 9.3 mg/dL (ref 8.9–10.3)
Chloride: 100 mmol/L (ref 98–111)
Chloride: 98 mmol/L (ref 98–111)
Creatinine, Ser: 1.03 mg/dL (ref 0.61–1.24)
Creatinine, Ser: 1.12 mg/dL (ref 0.61–1.24)
GFR calc Af Amer: >60 mL/min (ref >60)
GFR calc Af Amer: >60 mL/min (ref >60)
GFR calc non Af Amer: >60 mL/min (ref >60)
GFR calc non Af Amer: >60 mL/min (ref >60)
Glucose, Bld: 118 mg/dL — ABNORMAL HIGH (ref 70–99)
Glucose, Bld: 184 mg/dL — ABNORMAL HIGH (ref 70–99)
Potassium: 3.9 mmol/L (ref 3.5–5.1)
Potassium: 4.4 mmol/L (ref 3.5–5.1)
Sodium: 145 mmol/L (ref 135–145)
Sodium: 143 mmol/L (ref 135–145)

## 2019-09-19 LAB — COOXEMETRY PANEL
Carboxyhemoglobin: 3.3 % — ABNORMAL HIGH (ref 0.5–1.5)
Methemoglobin: 1.1 % (ref 0.0–1.5)
O2 Saturation: 78.2 %
Total hemoglobin: 9.5 g/dL — ABNORMAL LOW (ref 12.0–16.0)

## 2019-09-19 LAB — MAGNESIUM: Magnesium: 2.1 mg/dL (ref 1.7–2.4)

## 2019-09-19 LAB — LACTATE DEHYDROGENASE: LDH: 431 U/L — ABNORMAL HIGH (ref 98–192)

## 2019-09-19 LAB — CBC
HCT: 33.1 % — ABNORMAL LOW (ref 39.0–52.0)
HCT: 35.1 % — ABNORMAL LOW (ref 39.0–52.0)
Hemoglobin: 9.4 g/dL — ABNORMAL LOW (ref 13.0–17.0)
Hemoglobin: 10.1 g/dL — ABNORMAL LOW (ref 13.0–17.0)
MCH: 27.2 pg (ref 26.0–34.0)
MCH: 27.4 pg (ref 26.0–34.0)
MCHC: 28.4 g/dL — ABNORMAL LOW (ref 30.0–36.0)
MCHC: 28.8 g/dL — ABNORMAL LOW (ref 30.0–36.0)
MCV: 95.7 fL (ref 80.0–100.0)
MCV: 95.4 fL (ref 80.0–100.0)
Platelets: 191 10*3/uL (ref 150–400)
Platelets: 212 10*3/uL (ref 150–400)
RBC: 3.46 MIL/uL — ABNORMAL LOW (ref 4.22–5.81)
RBC: 3.68 MIL/uL — ABNORMAL LOW (ref 4.22–5.81)
RDW: 17.5 % — ABNORMAL HIGH (ref 11.5–15.5)
RDW: 17.7 % — ABNORMAL HIGH (ref 11.5–15.5)
WBC: 10.3 10*3/uL (ref 4.0–10.5)
WBC: 14.2 10*3/uL — ABNORMAL HIGH (ref 4.0–10.5)
nRBC: 0 % (ref 0.0–0.2)
nRBC: 0 % (ref 0.0–0.2)

## 2019-09-19 LAB — HEPARIN LEVEL (UNFRACTIONATED)
Heparin Unfractionated: 0.36 IU/mL (ref 0.30–0.70)
Heparin Unfractionated: 0.45 IU/mL (ref 0.30–0.70)

## 2019-09-19 LAB — FUNGUS CULTURE WITH STAIN

## 2019-09-19 LAB — APTT
aPTT: 77 seconds — ABNORMAL HIGH (ref 24–36)
aPTT: 73 seconds — ABNORMAL HIGH (ref 24–36)

## 2019-09-19 LAB — FUNGUS CULTURE RESULT

## 2019-09-19 LAB — FUNGAL ORGANISM REFLEX

## 2019-09-19 LAB — FIBRINOGEN: Fibrinogen: 660 mg/dL — ABNORMAL HIGH (ref 210–475)

## 2019-09-19 LAB — LACTIC ACID, PLASMA: Lactic Acid, Venous: 1.3 mmol/L (ref 0.5–1.9)

## 2019-09-19 LAB — PHOSPHORUS: Phosphorus: 4.2 mg/dL (ref 2.5–4.6)

## 2019-09-19 MED ORDER — HYDROMORPHONE HCL 2 MG PO TABS: 2.0000 mg | ORAL_TABLET | ORAL | Status: DC | PRN

## 2019-09-19 MED ORDER — HYDROMORPHONE HCL 2 MG PO TABS: 12.0000 mg | ORAL_TABLET | ORAL | Status: DC | PRN

## 2019-09-19 MED ORDER — HYDROMORPHONE HCL 2 MG PO TABS: 8.0000 mg | ORAL_TABLET | Freq: Four times a day (QID) | ORAL | Status: DC

## 2019-09-19 MED ORDER — HYDROMORPHONE HCL 2 MG PO TABS: 2.0000 mg | ORAL_TABLET | Freq: Four times a day (QID) | ORAL | Status: DC

## 2019-09-19 MED ORDER — HYDROMORPHONE HCL 2 MG PO TABS: 4.0000 mg | ORAL_TABLET | ORAL | Status: DC | PRN

## 2019-09-19 MED ORDER — HYDROMORPHONE HCL 2 MG PO TABS
12.0000 mg | ORAL_TABLET | Freq: Four times a day (QID) | ORAL | Status: DC
  Administered 2019-09-19 – 2019-09-21 (×8): 12 mg via ORAL
  Filled 2019-09-19 (×8): qty 6

## 2019-09-19 MED ORDER — POTASSIUM CHLORIDE 20 MEQ/15ML (10%) PO SOLN
40.0000 meq | Freq: Once | ORAL | Status: AC
  Administered 2019-09-19: 40 meq
  Filled 2019-09-19: qty 30

## 2019-09-19 MED ORDER — HYDROMORPHONE HCL 2 MG PO TABS: 8.0000 mg | ORAL_TABLET | ORAL | Status: DC | PRN

## 2019-09-19 MED ORDER — HYDRALAZINE HCL 10 MG PO TABS
10.0000 mg | ORAL_TABLET | Freq: Three times a day (TID) | ORAL | Status: DC
  Administered 2019-09-19 – 2019-09-20 (×6): 10 mg
  Filled 2019-09-19 (×7): qty 1

## 2019-09-19 MED ORDER — HYDROMORPHONE HCL 2 MG PO TABS: 2.0000 mg | ORAL_TABLET | Freq: Four times a day (QID) | ORAL | Status: DC | PRN

## 2019-09-19 MED ORDER — HYDROMORPHONE HCL 2 MG PO TABS: 4.0000 mg | ORAL_TABLET | Freq: Four times a day (QID) | ORAL | Status: DC

## 2019-09-19 NOTE — Progress Notes (Signed)
Heparin drip paused from 1622 to 1648 while Dr. Prescott Gum removed patient's left subclavian central line.  Joellen Jersey, RN

## 2019-09-19 NOTE — TOC Progression Note (Signed)
Transition of Care [facility]) - Progression Note    Patient Details  Name: [name] MRN: [id number] Date of Birth: 10/08/1975  Transition of Care [facility]) CM/SW Contact  Graves-Bigelow, Ocie Cornfield, RN Phone Number: 09/19/2019, 3:23 PM  Clinical Narrative: Patient presented for hypoxic respiratory failure due to COVID Pneumonia. Patient remains on ECMO. Physical Therapy recommendations are for long term acute care (LTAC)- unable to accept the patient on ECMO; however, once off of [place] can be considered if insurance approves. Case Manager will continue to follow for transition of care needs as the patient progresses.    Expected Discharge Plan: [place] Barriers to Discharge: Continued Medical Work up(Continues on IV Rocephin, Nimbex and Precedex.)  Expected Discharge Plan and Services Expected Discharge Plan: [place]   Discharge Planning Services: CM Consult   Living arrangements for the past 2 months: Single Family Home                    Social Determinants of Health (SDOH) Interventions    Readmission Risk Interventions No flowsheet data found.

## 2019-09-19 NOTE — Progress Notes (Signed)
Dr. Orvan Seen rounding on patient. Notified of increasing WBC and slightly elevated temp (37.4-37.5). No new orders. Will monitor.  Joellen Jersey, RN

## 2019-09-19 NOTE — Progress Notes (Signed)
ANTICOAGULATION CONSULT NOTE - Follow Up Consult  Pharmacy Consult for heparin Indication: ECMO  Labs: Recent Labs    09/18/19 0334 09/18/19 0629 09/18/19 0734 09/18/19 1619 09/18/19 1619 09/19/19 0336 09/19/19 0350  HGB 9.1*   < >  --  9.4*  9.9*   < > 9.4* 9.9*  HCT 32.0*   < >  --  32.9*  29.0*  --  33.1* 29.0*  PLT 163  --   --  190  --  191  --   APTT 119*  --   --  68*  --  77*  --   HEPARINUNFRC 0.62   < > 0.25* 0.27*  --  0.36  --   CREATININE 1.01  --   --  1.16  --  1.03  --    < > = values in this interval not displayed.    Assessment: 44yo male on ECMO heparin drip 2000 uts/hr. Heparin level therapeutic at 0.36, CBC stable, fibrinogen overall down and stable this am.   Goal of Therapy:  Heparin level 0.3-0.5 units/ml (aiming for lowest end)   Plan:  -Continue heparin 2000 units/h -Check heparin level q12h  Arrie Senate, PharmD, BCPS Clinical Pharmacist [phone] Please check AMION for all [place] numbers 09/19/2019

## 2019-09-19 NOTE — Progress Notes (Signed)
Pt more awake. Physician wanted to see if pt will blow off more CO2

## 2019-09-19 NOTE — Progress Notes (Signed)
Patient ID: [name], male   DOB: [birth year], 44 y.o.   MRN: [id number]    Advanced Heart Failure Rounding Note   Subjective:    Remains on VV ecmo with good flows.   Sedated on Precedex and po dilaudid but will arouse but not reliably follow commands  Diuresed over 12 liters last night on lasix gtt at 12 and addition of diamox. Weight down 7 pounds.   CVP 6 Sweep 11  Had a good night pulling TVs > 400. THis morning sedated again and TVs down around 200 on PEEP 15  ABG 7.45/55/74/95%  Co-ox 78%   On vent at 50% on PC  CXR with diffuse bilateral infiltrates improved today Crescent catheter well positioned  Personally reviewed   hgb 9.4 wbc10.3. Sodium 155-> 151 -> 143 -> 145  ECMO parameters - see separate ecmo note   Objective:   Weight Range:  Vital Signs:   Pulse Rate:  [76-93] 92 (02/11 1400) Resp:  [11-27] 16 (02/11 1400) BP: (175)/(54) 175/54 (02/10 1621) SpO2:  [91 %-98 %] 97 % (02/11 1400) Arterial Line BP: (114-173)/(45-70) 155/69 (02/11 1400) FiO2 (%):  [50 %] 50 % (02/11 1126) Weight:  [601 kg] 138 kg (02/11 0500) Last BM Date: 09/19/19  Weight change: Filed Weights   09/15/19 0500 09/17/19 0600 09/19/19 0500  Weight: (!) 141.3 kg (!) 141.3 kg (!) 138 kg    Intake/Output:   Intake/Output Summary (Last 24 hours) at 09/19/2019 1442 Last data filed at 09/19/2019 1400 Gross per 24 hour  Intake 2578.62 ml  Output 12415 ml  Net -9836.38 ml     Physical Exam: General: Sedated on vent Will arouse to voice HEENT: normal Neck: supple. RIJ cannula ok Carotids 2+ bilat; no bruits. No lymphadenopathy or thryomegaly appreciated. Cor: PMI nondisplaced. Regular rate & rhythm. No rubs, gallops or murmurs. Lungs: mild air movement Abdomen: obese soft, nontender, nondistended. No hepatosplenomegaly. No bruits or masses. Good bowel sounds. Extremities: no cyanosis, clubbing, rash, 2+ edema + DTI on feet Neuro: sedated on vent will  arouse   Telemetry: Sinus 90s  Personally reviewed  Labs: Basic Metabolic Panel: Recent Labs  Lab 09/17/19 0445 09/17/19 0454 09/17/19 1624 09/17/19 1626 09/18/19 0334 09/18/19 0334 09/18/19 0629 09/18/19 1619 09/19/19 0336 09/19/19 0350 09/19/19 1155  NA 149*   < > 150*   < > 143   < > 145 146*  146* 145 146* 144  K 4.1   < > 3.9   < > 4.0   < > 3.7 4.1  4.1 3.9 3.8 4.1  CL 111  --  110  --  107  --   --  104 100  --   --   CO2 27  --  29  --  31  --   --  31 34*  --   --   GLUCOSE 133*  --  137*  --  120*  --   --  153* 118*  --   --   BUN 105*  --  102*  --  102*  --   --  105* 114*  --   --   CREATININE 1.13  --  1.13  --  1.01  --   --  1.16 1.03  --   --   CALCIUM 8.9   < > 9.0   < > 8.7*  --   --  9.0 9.1  --   --   MG 2.0  --   --   --  1.8  --   --   --  2.1  --   --   PHOS 4.7*  --   --   --  4.4  --   --   --  4.2  --   --    < > = values in this interval not displayed.    Liver Function Tests: No results for input(s): AST, ALT, ALKPHOS, BILITOT, PROT, ALBUMIN in the last 168 hours. No results for input(s): LIPASE, AMYLASE in the last 168 hours. No results for input(s): AMMONIA in the last 168 hours.  CBC: Recent Labs  Lab 09/17/19 0445 09/17/19 0454 09/17/19 1624 09/17/19 1626 09/18/19 0334 09/18/19 0334 09/18/19 0629 09/18/19 1619 09/19/19 0336 09/19/19 0350 09/19/19 1155  WBC 11.6*  --  12.3*  --  10.5  --   --  11.0* 10.3  --   --   HGB 8.8*   < > 9.4*   < > 9.1*   < > 9.9* 9.4*  9.9* 9.4* 9.9* 11.2*  HCT 31.4*   < > 33.1*   < > 32.0*   < > 29.0* 32.9*  29.0* 33.1* 29.0* 33.0*  MCV 98.7  --  97.4  --  97.0  --   --  95.4 95.7  --   --   PLT 153  --  168  --  163  --   --  190 191  --   --    < > = values in this interval not displayed.    Cardiac Enzymes: No results for input(s): CKTOTAL, CKMB, CKMBINDEX, TROPONINI in the last 168 hours.  BNP: BNP (last 3 results) Recent Labs    07/22/19 1039  BNP 15.3    ProBNP (last 3  results) No results for input(s): PROBNP in the last 8760 hours.    Other results:  Imaging: DG CHEST PORT 1 VIEW  Result Date: 09/19/2019 CLINICAL DATA:  Follow-up exam. EXAM: PORTABLE CHEST 1 VIEW COMPARISON:  09/18/2019 FINDINGS: Bilateral interstitial and airspace lung opacities are similar to the previous day's study allowing for differences in patient positioning and technique. No new lung abnormalities.  No pneumothorax. Bilateral chest tubes, tracheostomy tube, right PICC and left subclavian central venous line are stable. IMPRESSION: 1. No significant change from the previous day's exam. 2. Persistent bilateral interstitial and airspace lung opacities. 3. No pneumothorax. 4. Stable chest tubes and other support apparatus. Electronically Signed   By: Lajean Manes M.D.   On: 09/19/2019 09:02   DG CHEST PORT 1 VIEW  Result Date: 09/18/2019 CLINICAL DATA:  Patient on ECMO. EXAM: PORTABLE CHEST 1 VIEW COMPARISON:  09/17/2019 FINDINGS: ECMO catheter unchanged. Tracheostomy tube unchanged. Left subclavian central venous catheter and bilateral chest tubes unchanged. Right-sided PICC line unchanged. Lungs are hypoinflated with stable bilateral airspace opacification. No definite effusion. Cardiomediastinal silhouette and remainder of the exam is unchanged. IMPRESSION: 1.  Hypoinflation with stable bilateral airspace consolidation. 2.  Multiple tubes and lines as described unchanged. Electronically Signed   By: Marin Olp M.D.   On: 09/18/2019 08:57     Medications:     Scheduled Medications: . acetaZOLAMIDE  250 mg Per Tube BID  . amiodarone  200 mg Oral Daily  . vitamin C  500 mg Per Tube Daily  . chlorhexidine gluconate (MEDLINE KIT)  15 mL Mouth Rinse BID  . Chlorhexidine Gluconate Cloth  6 each Topical Daily  . diazepam  15 mg Oral Q6H   Followed by  . [  START ON 09/22/2019] diazepam  10 mg Oral Q6H  . feeding supplement (OSMOLITE 1.5 CAL)  237 mL Per Tube 6 X Daily  . feeding  supplement (PRO-STAT SUGAR FREE 64)  60 mL Per Tube QID  . free water  300 mL Per Tube Q6H  . gabapentin  200 mg Per Tube Q8H  . hydrALAZINE  10 mg Per Tube Q8H  . HYDROmorphone  12 mg Oral Q6H   Followed by  . [START ON 09/22/2019] HYDROmorphone  8 mg Oral Q6H   Followed by  . [START ON 09/25/2019] HYDROmorphone  4 mg Oral Q6H   Followed by  . [START ON 09/28/2019] HYDROmorphone  2 mg Oral Q6H  . insulin aspart  0-20 Units Subcutaneous Q4H  . ipratropium-albuterol  3 mL Nebulization Q4H  . mouth rinse  15 mL Mouth Rinse 10 times per day  . montelukast  10 mg Per Tube QHS  . nutrition supplement (JUVEN)  1 packet Per Tube BID BM  . pantoprazole sodium  40 mg Per Tube QHS  . propranolol  40 mg Per Tube TID  . psyllium  1 packet Oral BID  . QUEtiapine  50 mg Per Tube BID  . sodium chloride flush  10-40 mL Intracatheter Q12H  . white petrolatum   Topical BID    Infusions: . sodium chloride 10 mL/hr at 09/19/19 1400  . sodium chloride    . sodium chloride Stopped (09/05/19 1900)  . sodium chloride 10 mL/hr at 09/11/19 2156  . dexmedetomidine (PRECEDEX) IV infusion 0.25 mcg/kg/hr (09/19/19 1400)  . furosemide (LASIX) infusion 5 mg/hr (09/19/19 1400)  . heparin 2,000 Units/hr (09/19/19 1400)    PRN Medications: Place/Maintain arterial line **AND** sodium chloride, sodium chloride, sodium chloride, acetaminophen (TYLENOL) oral liquid 160 mg/5 mL, acetaminophen, hydrALAZINE, HYDROmorphone **FOLLOWED BY** [START ON 09/22/2019] HYDROmorphone **FOLLOWED BY** [START ON 09/25/2019] HYDROmorphone **FOLLOWED BY** [START ON 09/28/2019] HYDROmorphone **FOLLOWED BY** [START ON 10/01/2019] HYDROmorphone, magic mouthwash, magnesium hydroxide, ondansetron (ZOFRAN) IV, senna-docusate, sodium chloride   Assessment/Plan:    1. Acute hypoxic/hypercarbic respiratory failure due to COVID PNA: Remained markedly hypoxic despite full support. Intubated 12/26. S/p bilateral CTs 12/26 for pneumothoraces.  TEE on  12/26 LVEF 70% RV ok. VV ECMO begun 12/26. Tracheostomy 12/29.  COVID ARDS at this point. ECMO circuit changed 1/8 & 1/23. Crescent cannula placed 2/2 (femoral cannuals removed) - ECMO parameters as per ECMO note - Flows look  good on Crescent. Diuresed aggressively overnight. CXR improved today. Sweep 12 Decreased lasix gtt to 5  D/w CCM - LDH 289-> 365 -> 384  -> 431. Watch closely deltaP ok  - Continue heparin. No bleeding Discussed dosing with PharmD personally. - Continue chest PT - Continue regular episodes of awakening and letting him work interspersed with sedation and rest periods.  2. Bilateral PTX: Due to barotrauma.  - Resolved  3. COVID PNA: management of COVID infection per CCM. CXR with bilateral multifocal PNA progressing to likely ARDS.  - s/p 10 days of remdesivir - 12/16, 12/2 s/p tociluzimab - 12/17 s/p convalescent plasma - Decadron at 79m/daily completed 1/11 - Completed cefepime/vanc - Off Ancef  4. ID: Empiric coverage with vancomycin/cefepime initially for possible secondary bacterial PNA, course completed.   - treated with cefepime and vanc for + cx with staph epidimidis, MSSA and serratia (sensitivities ok). - Currently off abx - BAL cx. With serratia and enterococcus felt to be colonization - Stable WBC ok   5. Thrombocytopenia: Slow decrease in platelets,  likely low level hemolysis + critical illness.  - resolved. Platelets 191  today  6. Anemia: - hgb 9.4today. Transfuse as needed to keep Hgb> 8.0  7. FEN - now with G-tube. Resume TF  8. Hypernatremia - NA 155 -> 143 -> 145 - Continue FW at current rate. Hopefully can decrease slightly soon   9. AKI - Back on lasix gtt with good output  - he is 3rd spacing. Continue unna boots.  - Creatinine improved BUN > 100 -  Possibility of CVVHD discussed extensively on rounds today also d/w Renal. No current indication for HD. D/w Renal. Would consider CVVHD once sedation weaned and felt to be  uremic  10. DTIs - WOC following.   D/w with ECMO team (ECMO coordinator/specialists, CCM, TCTS and pharmD) at bedside on multidisciplinary rounds. Continue to await ARDS recovery. Details as above.   Length of Stay: 39  CRITICAL CARE Performed by: Glori Bickers  Total critical care time: 45  minutes  Critical care time was exclusive of separately billable procedures and treating other patients.  Critical care was necessary to treat or prevent imminent or life-threatening deterioration.  Critical care was time spent personally by me (independent of midlevel providers or residents) on the following activities: development of treatment plan with patient and/or surrogate as well as nursing, discussions with consultants, evaluation of patient's response to treatment, examination of patient, obtaining history from patient or surrogate, ordering and performing treatments and interventions, ordering and review of laboratory studies, ordering and review of radiographic studies, pulse oximetry and re-evaluation of patient's condition.    Glori Bickers MD 09/19/2019, 2:42 PM  Advanced Heart Failure Team Pager [phone] (M-F; [place])  Please contact [facility] for night-coverage after hours (4p -7a ) and weekends on [email or web address]

## 2019-09-19 NOTE — Consult Note (Addendum)
[facility] Nurse wound follow up Pt seen in 2H04.  Wound type: unstagable, full thickness wound on posterior neck  Measurement: 0.5 cm x 2 cm x 0.3 cm (open) 0.5 cm x 3 cm x 0.0 cm (scabbed) Wound bed:  (open) yellow wound bed ; (scabbed) dry, black Drainage (amount, consistency, odor) none Periwound: intact Dressing procedure/placement/frequency: Cleansed, xeroform placed in wound and secured with dry gauze & trach ties. Nurse said "tape not being used at hair line, didn't stick well."  Thank you for the consult. Discussed the plan of care with the beside nurse. Alphonzo Lemmings, BSN, RN-BC, WTA-C, OCA

## 2019-09-19 NOTE — Plan of Care (Signed)
  Problem: Education: Goal: Knowledge of risk factors and measures for prevention of condition will improve Outcome: Progressing   Problem: Coping: Goal: Psychosocial and spiritual needs will be supported Outcome: Progressing   Problem: Respiratory: Goal: Will maintain a patent airway Outcome: Progressing Goal: Complications related to the disease process, condition or treatment will be avoided or minimized Outcome: Progressing   Problem: Respiratory: Goal: Ability to maintain a clear airway and adequate ventilation will improve Outcome: Progressing   Problem: Role Relationship: Goal: Method of communication will improve Outcome: Progressing   Problem: Education: Goal: Knowledge of General Education information will improve Description: Including pain rating scale, medication(s)/side effects and non-pharmacologic comfort measures Outcome: Progressing   Problem: Health Behavior/Discharge Planning: Goal: Ability to manage health-related needs will improve Outcome: Progressing   Problem: Clinical Measurements: Goal: Ability to maintain clinical measurements within normal limits will improve Outcome: Progressing Goal: Will remain free from infection Outcome: Progressing Goal: Diagnostic test results will improve Outcome: Progressing Goal: Respiratory complications will improve Outcome: Progressing Goal: Cardiovascular complication will be avoided Outcome: Progressing   Problem: Activity: Goal: Risk for activity intolerance will decrease Outcome: Progressing   Problem: Nutrition: Goal: Adequate nutrition will be maintained Outcome: Progressing   Problem: Coping: Goal: Level of anxiety will decrease Outcome: Progressing   Problem: Elimination: Goal: Will not experience complications related to bowel motility Outcome: Progressing Goal: Will not experience complications related to urinary retention Outcome: Progressing   Problem: Pain Managment: Goal: General  experience of comfort will improve Outcome: Progressing   Problem: Safety: Goal: Ability to remain free from injury will improve Outcome: Progressing   Problem: Skin Integrity: Goal: Risk for impaired skin integrity will decrease Outcome: Progressing

## 2019-09-19 NOTE — Progress Notes (Signed)
NAME:  [name], MRN:  [id number], DOB:  05-06-1976, LOS: 55 ADMISSION DATE:  07/22/2019, CONSULTATION DATE:  12/24 REFERRING MD:  Sloan Leiter, CHIEF COMPLAINT:  Dyspnea   Brief History   44 y/o M admitted 12/14 with COVID pneumonia causing acute hypoxemic respiratory failure. Developed pneumomediastinum 12/23 with concern for possible bilateral small pneumothoraces on 12/24.  PCCM consulted for evaluation of pneumothoraces  Past Medical History  GERD HTN Asthma  Significant Hospital Events   12/14 admit with hypoxemic respiratory failure in setting of COVID-19 pneumonia 12/23 noted to have pneumomediastinum on chest x-ray 12/24 PCCM consulted for evaluation 12/26 worsening hypoxemia, intubated  12/27 VV fem/fem ECMO cannulation See bedside nursing event notes for full rundown of procedures after 12/27 1/30 improved cxr, improved lung compliance, TV 350s on 15PC  1/31 CXR increased infiltrates, +CFB     2/2 Converted to right IJ Crescent cannula. 2/3 PEG tube inserted at bedside  Consults:  PCCM, CTS, CHF, Palliative  Significant Diagnostic Tests:   CT chest 12/22 >> extensive pneumomediastinum, multifocal patchy bilateral groundglass opacities CXR 1/25 >> improving bilateral infiltrates. ECMO cannulae in place. CXR 2/5 >> clearing bilateral infiltrates  Micro Data:  BCx2 12/14 >> negative  Sputum 12/15 >> negative  1/9 bronch: serratia and MSSA 1/2 acid fast smear bronch: negative 1/2 fungal cx bronch: pending 1/12 BAL: serratia 1/18 BAL >> rare Serratia. 2/1 BAL >> rare Serratia - few WBC  Antimicrobials:  Received remdesivir, steroids, actemra and convalescent plasma  5 days cefepime 12/24-12/29 Cefepime 1/4->1/14 vanc 1/4->1/14 Ceftriaxone 1/14 >>1/27 Cefazolin 1/28>> 02/2   Interim history/subjective:   Bronchoscopy yesterday showed only minimal secretions.  Bloody secretion since then.  Followed commands for nursing staff.  Objective   Blood pressure  (!) 175/54, pulse 81, temperature 98.9 F (37.2 C), temperature source Oral, resp. rate (!) 22, height _0  (1.727 m), weight (!) 138 kg, SpO2 97 %. CVP:  [4 mmHg-14 mmHg] 8 mmHg  Vent Mode: PCV FiO2 (%):  [50 %-60 %] 50 % Set Rate:  [15 bmp] 15 bmp PEEP:  [16 cmH20] 16 cmH20 Plateau Pressure:  [28 cmH20-32 cmH20] 29 cmH20   Intake/Output Summary (Last 24 hours) at 09/19/2019 9024 Last data filed at 09/19/2019 0830 Gross per 24 hour  Intake 2723.3 ml  Output 12825 ml  Net -10101.7 ml   Filed Weights   09/15/19 0500 09/17/19 0600 09/19/19 0500  Weight: (!) 141.3 kg (!) 141.3 kg (!) 138 kg   Examination GEN: ill obese man on vent HEENT: trach in place, no further bleeding around tracheostomy but secretions bloody from tracheostomy CV: distant, ext warm PULM: improved vesicular breath sounds anteriorly, no wheezing. Bronchial breath sounds have receded to mid-axillary line.  Breath stacking has improved. Still abdominal breathing when awake. GI: Soft, +BS EXT: diffuse anasarca improved NEURO: opens eyes to voice, occasional coughing on vent. PSYCH: deferred SKIN: slightly pale.  Areas of skin breakdown from support devices.   CT head 1/28: pansinusitis. CT chest 1/28: Dense bilateral consolidation in dependent lungs both sides CT abdomen 1/28: Cannulae in position in IVC.  Flow (LPM): 4.76 ECMO Mode: VV ECMO Device: Cardiohelp  Vent Mode: PCV, Set Rate: 15 bmp, FiO2 (%): 50 %, I Time: 0.81 Sec(s), PEEP: 16 cmH20 Vt today 385m  Net IO Since Admission: 38,437.91 mL [09/19/19 0833]  Resolved Hospital Problem list   MSSA and serratia marcescens pneumonia  Epistaxis Atrial fibrillation Assessment & Plan:   Acute hypoxemic respiratory failure with ARDS  secondary to COVID-19, status post tracheostomy on mechanical ventilation and VV ECMO support.  Slowly resolving ARDS. Limited respiratory reserve with persistently high burden of disease, in keeping with natural history of  COVID related ARDS.  Generally easier flow following cannula change. - No bronchoscopy today - Run lower heparin to allow bronchoscopy induced airway trauma to subside.  - Continue lung protective ventilation.  - Continue to diurese  - Allow periods of more spontaneous breathing alternating with rest.  Allow for uninterrupted sleep at nighttime  Hypernatremia - improving.  - increased free water enteral and 0.45% saline. - metamucil to bulk stool to avoid free water loss in diarrhea.  Hypertension - Increase propranolol.  Daily Goals Checklist   Pain/Anxiety/Delirium protocol (if indicated): keep infusions off. Continue enteral agents. Dexmedetomidine and prn Versed and Dilaudid. Begin to taper enteral sedative.  VAP protocol (if indicated): Bundle in place. Respiratory support goals: Pressure control ventilation with ultra protective lung ventilation. Titrate FiO2 down again. Continue PEEP 16  Blood pressure target:  MAP>65, off norepinephrine and IV amiodarone.  DVT prophylaxis: Remains on systemic heparin - target Heparin level 0.3-0.7 Nutrition Status: Nutrition Problem: Increased nutrient needs Etiology: acute illness(COVID PNA) Signs/Symptoms: estimated needs Interventions: Tube feeding, Prostat  Bolus feed. GI prophylaxis: Protonix per tube. Fluid status goals: decrease furosemide infusion rate. Urinary catheter: Guide hemodynamic management Glucose control: Euglycemic control with SSI Mobility/therapy needs: Bedrest.  Daily labs: as per ECMO protocol. Code Status: Full code  Family Communication:family updated by RN. Disposition: ICU  Labs   CBC: CBC Latest Ref Rng & Units 09/19/2019 09/19/2019 09/18/2019  WBC 4.0 - 10.5 K/uL - 10.3 11.0(H)  Hemoglobin 13.0 - 17.0 g/dL 9.9(L) 9.4(L) 9.4(L)  Hematocrit 39.0 - 52.0 % 29.0(L) 33.1(L) 32.9(L)  Platelets 150 - 400 K/uL - 191 170    Basic Metabolic Panel: BMP Latest Ref Rng & Units 09/19/2019 09/19/2019 09/18/2019  Glucose  70 - 99 mg/dL - 118(H) 153(H)  BUN 6 - 20 mg/dL - 114(H) 105(H)  Creatinine 0.61 - 1.24 mg/dL - 1.03 1.16  Sodium 135 - 145 mmol/L 146(H) 145 146(H)  Potassium 3.5 - 5.1 mmol/L 3.8 3.9 4.1  Chloride 98 - 111 mmol/L - 100 104  CO2 22 - 32 mmol/L - 34(H) 31  Calcium 8.9 - 10.3 mg/dL - 9.1 9.0    GFR: Estimated Creatinine Clearance: 123.2 mL/min (by C-G formula based on SCr of 1.03 mg/dL). Recent Labs  Lab 09/16/19 0438 09/16/19 1601 09/17/19 0445 09/17/19 0445 09/17/19 1624 09/18/19 0334 09/18/19 1619 09/19/19 0336  WBC 9.8   < > 11.6*   < > 12.3* 10.5 11.0* 10.3  LATICACIDVEN 1.1  --  0.9  --   --  1.1  --  1.3   < > = values in this interval not displayed.    Liver Function Tests: No results for input(s): AST, ALT, ALKPHOS, BILITOT, PROT, ALBUMIN in the last 168 hours. No results for input(s): LIPASE, AMYLASE in the last 168 hours. No results for input(s): AMMONIA in the last 168 hours.  ABG    Component Value Date/Time   PHART 7.430 09/19/2019 0350   PCO2ART 56.6 (H) 09/19/2019 0350   PO2ART 69.0 (L) 09/19/2019 0350   HCO3 37.6 (H) 09/19/2019 0350   TCO2 39 (H) 09/19/2019 0350   ACIDBASEDEF 4.0 (H) 09/16/2019 2334   O2SAT 94.0 09/19/2019 0350     Coagulation Profile: No results for input(s): INR, PROTIME in the last 168 hours.  Cardiac Enzymes: No results  for input(s): CKTOTAL, CKMB, CKMBINDEX, TROPONINI in the last 168 hours.  HbA1C: Hgb A1c MFr Bld  Date/Time Value Ref Range Status  07/24/2019 04:50 AM 6.7 (H) 4.8 - 5.6 % Final    Comment:    (NOTE) Pre diabetes:          5.7%-6.4% Diabetes:              >6.4% Glycemic control for   <7.0% adults with diabetes   09/30/2017 05:36 AM 5.7 (H) 4.8 - 5.6 % Final    Comment:    (NOTE)         Prediabetes: 5.7 - 6.4         Diabetes: >6.4         Glycemic control for adults with diabetes: <7.0     CBG: Recent Labs  Lab 09/18/19 1617 09/18/19 2004 09/18/19 2338 09/19/19 0347 09/19/19 0818   GLUCAP 136* 166* 162* 116* 115*     CRITICAL CARE Performed by: Kipp Brood   Total critical care time: 40 minutes  Critical care time was exclusive of separately billable procedures and treating other patients.  Critical care was necessary to treat or prevent imminent or life-threatening deterioration.  Critical care was time spent personally by me on the following activities: development of treatment plan with patient and/or surrogate as well as nursing, discussions with consultants, evaluation of patient's response to treatment, examination of patient, obtaining history from patient or surrogate, ordering and performing treatments and interventions, ordering and review of laboratory studies, ordering and review of radiographic studies, pulse oximetry, re-evaluation of patient's condition and participation in multidisciplinary rounds.  Kipp Brood, MD [facility] ICU Physician [place]  Pager: [phone] Mobile: [phone] After hours: [phone].

## 2019-09-19 NOTE — Progress Notes (Signed)
ANTICOAGULATION CONSULT NOTE - Follow Up Consult  Pharmacy Consult for heparin Indication: ECMO  Labs: Recent Labs    09/18/19 1619 09/18/19 1619 09/19/19 0336 09/19/19 0350 09/19/19 1155 09/19/19 1155 09/19/19 1608 09/19/19 1614  HGB 9.4*  9.9*   < > 9.4*   < > 11.2*   < > 10.1* 10.9*  HCT 32.9*  29.0*   < > 33.1*   < > 33.0*  --  35.1* 32.0*  PLT 190  --  191  --   --   --  212  --   APTT 68*  --  77*  --   --   --  73*  --   HEPARINUNFRC 0.27*  --  0.36  --   --   --  0.45  --   CREATININE 1.16  --  1.03  --   --   --  1.12  --    < > = values in this interval not displayed.    Assessment: 32 YOM continues on IV heparin while on ECMO.  Heparin level is acceptable and trending up.  No overt bleeding per RN.  Goal of Therapy:  Heparin level 0.3-0.5 units/ml (aiming for lowest end)   Plan:  Reduce heparin infusion slightly to 1950 units/hr to aim for the lowest therapeutic level Continue Q12H heparin level Monitor closely for s/sx of bleeding  [name], PharmD, BCPS, [place] 09/19/2019, 6:11 PM

## 2019-09-19 NOTE — CV Procedure (Signed)
   ECMO NOTE:  Indication: Respiratory failure due to COVID PNA  Initial cannulation date: 08/03/2019  ECMO type: VV ECMO (Cardio-Help)  Dual lumen  Inflow/return cannula:  1) 32 FR Crescent placed 2/2  ECMO events:  Initial cannulation 12/26 Circuit changed and trach exchanged on 1/8.   Underwent addition of a RIJ drainage cannula on 1/20 Circuit changed on 1/22 for rising LDH Had evidence of recirculation and RIJ cannula pulled back on 1/26 19 FR RIJ removed 09/06/19 Underwent change to [facility] catheter with removal of femoral lines 2/2  Daily data:  Remains on VV ecmo with good flows.    Flow 4.77 RPM 3600 dP 35 Pven  -84 Sweep  9 -> 12  SVO2 61%  Labs:  Hgb 9.4 LDH 297-> 365 -> 384 -> 431 Heparin level: 0.36 Lactic acid 1.3  Plan:  Crescent cannula working well Excellent flow and sats CXR improved with diuresis. Decrease lasix to 5 Continue to wean in 4 hour bursts then allow to rest with sedation.  Watch DTIs Possibility of CVVHD raised -> no indication at this point. Wean sedation. If uremic can consider CVVHD to deal with azotemia  - Watch LDH. Suspect may need circuit change in next few days. D/w ECMO perfusionist/coordinator.  Glori Bickers, MD  8:34 AM

## 2019-09-19 NOTE — Plan of Care (Signed)
  Problem: Respiratory: Goal: Will maintain a patent airway Outcome: Progressing   Problem: Respiratory: Goal: Ability to maintain a clear airway and adequate ventilation will improve Outcome: Progressing   Problem: Clinical Measurements: Goal: Ability to maintain clinical measurements within normal limits will improve Outcome: Progressing Goal: Cardiovascular complication will be avoided Outcome: Progressing   Problem: Nutrition: Goal: Adequate nutrition will be maintained Outcome: Progressing   Problem: Pain Managment: Goal: General experience of comfort will improve Outcome: Progressing

## 2019-09-19 NOTE — Progress Notes (Signed)
Patient ID: [name], male   DOB: 12-18-1975, 43 y.o.   MRN: [id number] TCTS Evening Rounds:  Hemodynamically stable on V-V ECMO. sats 97% on 50% FiO2, 7.41, PCO2 62, 82, 95%. Sedated on vent.  UO 300 cc/hr on lasix 5/hr. BMET    Component Value Date/Time   NA 143 09/19/2019 1614   K 4.4 09/19/2019 1614   CL 98 09/19/2019 1608   CO2 34 (H) 09/19/2019 1608   GLUCOSE 184 (H) 09/19/2019 1608   BUN 117 (H) 09/19/2019 1608   CREATININE 1.12 09/19/2019 1608   CALCIUM 9.3 09/19/2019 1608   GFRNONAA >60 09/19/2019 1608   GFRAA >60 09/19/2019 1608   ECMO circuit working fine.

## 2019-09-20 ENCOUNTER — Inpatient Hospital Stay (HOSPITAL_COMMUNITY): Payer: Managed Care, Other (non HMO)

## 2019-09-20 LAB — POCT I-STAT 7, (LYTES, BLD GAS, ICA,H+H)
Acid-Base Excess: 14 mmol/L — ABNORMAL HIGH (ref 0.0–2.0)
Acid-Base Excess: 16 mmol/L — ABNORMAL HIGH (ref 0.0–2.0)
Acid-Base Excess: 14 mmol/L — ABNORMAL HIGH (ref 0.0–2.0)
Acid-Base Excess: 14 mmol/L — ABNORMAL HIGH (ref 0.0–2.0)
Acid-Base Excess: 14 mmol/L — ABNORMAL HIGH (ref 0.0–2.0)
Acid-Base Excess: 13 mmol/L — ABNORMAL HIGH (ref 0.0–2.0)
Bicarbonate: 39.9 mmol/L — ABNORMAL HIGH (ref 20.0–28.0)
Bicarbonate: 41.7 mmol/L — ABNORMAL HIGH (ref 20.0–28.0)
Bicarbonate: 39.5 mmol/L — ABNORMAL HIGH (ref 20.0–28.0)
Bicarbonate: 42.9 mmol/L — ABNORMAL HIGH (ref 20.0–28.0)
Bicarbonate: 39.1 mmol/L — ABNORMAL HIGH (ref 20.0–28.0)
Bicarbonate: 38.9 mmol/L — ABNORMAL HIGH (ref 20.0–28.0)
Calcium, Ion: 1.23 mmol/L (ref 1.15–1.40)
Calcium, Ion: 1.22 mmol/L (ref 1.15–1.40)
Calcium, Ion: 1.22 mmol/L (ref 1.15–1.40)
Calcium, Ion: 1.25 mmol/L (ref 1.15–1.40)
Calcium, Ion: 1.23 mmol/L (ref 1.15–1.40)
Calcium, Ion: 1.22 mmol/L (ref 1.15–1.40)
HCT: 30 % — ABNORMAL LOW (ref 39.0–52.0)
HCT: 30 % — ABNORMAL LOW (ref 39.0–52.0)
HCT: 30 % — ABNORMAL LOW (ref 39.0–52.0)
HCT: 32 % — ABNORMAL LOW (ref 39.0–52.0)
HCT: 31 % — ABNORMAL LOW (ref 39.0–52.0)
HCT: 30 % — ABNORMAL LOW (ref 39.0–52.0)
Hemoglobin: 10.2 g/dL — ABNORMAL LOW (ref 13.0–17.0)
Hemoglobin: 10.2 g/dL — ABNORMAL LOW (ref 13.0–17.0)
Hemoglobin: 10.2 g/dL — ABNORMAL LOW (ref 13.0–17.0)
Hemoglobin: 10.9 g/dL — ABNORMAL LOW (ref 13.0–17.0)
Hemoglobin: 10.5 g/dL — ABNORMAL LOW (ref 13.0–17.0)
Hemoglobin: 10.2 g/dL — ABNORMAL LOW (ref 13.0–17.0)
O2 Saturation: 97 %
O2 Saturation: 97 %
O2 Saturation: 97 %
O2 Saturation: 100 %
O2 Saturation: 97 %
O2 Saturation: 98 %
Patient temperature: 36.9
Patient temperature: 37
Patient temperature: 36.9
Patient temperature: 37
Patient temperature: 37
Patient temperature: 37
Potassium: 3.8 mmol/L (ref 3.5–5.1)
Potassium: 4.4 mmol/L (ref 3.5–5.1)
Potassium: 4.5 mmol/L (ref 3.5–5.1)
Potassium: 4 mmol/L (ref 3.5–5.1)
Potassium: 4.2 mmol/L (ref 3.5–5.1)
Potassium: 4.3 mmol/L (ref 3.5–5.1)
Sodium: 142 mmol/L (ref 135–145)
Sodium: 142 mmol/L (ref 135–145)
Sodium: 143 mmol/L (ref 135–145)
Sodium: 144 mmol/L (ref 135–145)
Sodium: 143 mmol/L (ref 135–145)
Sodium: 143 mmol/L (ref 135–145)
TCO2: 42 mmol/L — ABNORMAL HIGH (ref 22–32)
TCO2: 43 mmol/L — ABNORMAL HIGH (ref 22–32)
TCO2: 41 mmol/L — ABNORMAL HIGH (ref 22–32)
TCO2: 45 mmol/L — ABNORMAL HIGH (ref 22–32)
TCO2: 41 mmol/L — ABNORMAL HIGH (ref 22–32)
TCO2: 41 mmol/L — ABNORMAL HIGH (ref 22–32)
pCO2 arterial: 55.2 mmHg — ABNORMAL HIGH (ref 32.0–48.0)
pCO2 arterial: 57.5 mmHg — ABNORMAL HIGH (ref 32.0–48.0)
pCO2 arterial: 53.9 mmHg — ABNORMAL HIGH (ref 32.0–48.0)
pCO2 arterial: 77 mmHg (ref 32.0–48.0)
pCO2 arterial: 53.9 mmHg — ABNORMAL HIGH (ref 32.0–48.0)
pCO2 arterial: 55.2 mmHg — ABNORMAL HIGH (ref 32.0–48.0)
pH, Arterial: 7.466 — ABNORMAL HIGH (ref 7.350–7.450)
pH, Arterial: 7.469 — ABNORMAL HIGH (ref 7.350–7.450)
pH, Arterial: 7.472 — ABNORMAL HIGH (ref 7.350–7.450)
pH, Arterial: 7.354 (ref 7.350–7.450)
pH, Arterial: 7.469 — ABNORMAL HIGH (ref 7.350–7.450)
pH, Arterial: 7.456 — ABNORMAL HIGH (ref 7.350–7.450)
pO2, Arterial: 89 mmHg (ref 83.0–108.0)
pO2, Arterial: 85 mmHg (ref 83.0–108.0)
pO2, Arterial: 84 mmHg (ref 83.0–108.0)
pO2, Arterial: 320 mmHg — ABNORMAL HIGH (ref 83.0–108.0)
pO2, Arterial: 88 mmHg (ref 83.0–108.0)
pO2, Arterial: 96 mmHg (ref 83.0–108.0)

## 2019-09-20 LAB — GLUCOSE, CAPILLARY
Glucose-Capillary: 152 mg/dL — ABNORMAL HIGH (ref 70–99)
Glucose-Capillary: 174 mg/dL — ABNORMAL HIGH (ref 70–99)
Glucose-Capillary: 148 mg/dL — ABNORMAL HIGH (ref 70–99)
Glucose-Capillary: 166 mg/dL — ABNORMAL HIGH (ref 70–99)
Glucose-Capillary: 210 mg/dL — ABNORMAL HIGH (ref 70–99)
Glucose-Capillary: 215 mg/dL — ABNORMAL HIGH (ref 70–99)

## 2019-09-20 LAB — CBC
HCT: 33.1 % — ABNORMAL LOW (ref 39.0–52.0)
HCT: 32.5 % — ABNORMAL LOW (ref 39.0–52.0)
Hemoglobin: 9.8 g/dL — ABNORMAL LOW (ref 13.0–17.0)
Hemoglobin: 9.4 g/dL — ABNORMAL LOW (ref 13.0–17.0)
MCH: 27.7 pg (ref 26.0–34.0)
MCH: 27.2 pg (ref 26.0–34.0)
MCHC: 29.6 g/dL — ABNORMAL LOW (ref 30.0–36.0)
MCHC: 28.9 g/dL — ABNORMAL LOW (ref 30.0–36.0)
MCV: 93.5 fL (ref 80.0–100.0)
MCV: 93.9 fL (ref 80.0–100.0)
Platelets: 190 10*3/uL (ref 150–400)
Platelets: 190 10*3/uL (ref 150–400)
RBC: 3.54 MIL/uL — ABNORMAL LOW (ref 4.22–5.81)
RBC: 3.46 MIL/uL — ABNORMAL LOW (ref 4.22–5.81)
RDW: 17.4 % — ABNORMAL HIGH (ref 11.5–15.5)
RDW: 17.3 % — ABNORMAL HIGH (ref 11.5–15.5)
WBC: 11.9 10*3/uL — ABNORMAL HIGH (ref 4.0–10.5)
WBC: 14.8 10*3/uL — ABNORMAL HIGH (ref 4.0–10.5)
nRBC: 0 % (ref 0.0–0.2)
nRBC: 0 % (ref 0.0–0.2)

## 2019-09-20 LAB — BASIC METABOLIC PANEL
Anion gap: 11 (ref 5–15)
Anion gap: 11 (ref 5–15)
BUN: 118 mg/dL — ABNORMAL HIGH (ref 6–20)
BUN: 124 mg/dL — ABNORMAL HIGH (ref 6–20)
CO2: 34 mmol/L — ABNORMAL HIGH (ref 22–32)
CO2: 34 mmol/L — ABNORMAL HIGH (ref 22–32)
Calcium: 9.1 mg/dL (ref 8.9–10.3)
Calcium: 9.1 mg/dL (ref 8.9–10.3)
Chloride: 97 mmol/L — ABNORMAL LOW (ref 98–111)
Chloride: 97 mmol/L — ABNORMAL LOW (ref 98–111)
Creatinine, Ser: 1.07 mg/dL (ref 0.61–1.24)
Creatinine, Ser: 1.04 mg/dL (ref 0.61–1.24)
GFR calc Af Amer: >60 mL/min (ref >60)
GFR calc Af Amer: >60 mL/min (ref >60)
GFR calc non Af Amer: >60 mL/min (ref >60)
GFR calc non Af Amer: >60 mL/min (ref >60)
Glucose, Bld: 168 mg/dL — ABNORMAL HIGH (ref 70–99)
Glucose, Bld: 225 mg/dL — ABNORMAL HIGH (ref 70–99)
Potassium: 3.8 mmol/L (ref 3.5–5.1)
Potassium: 4.3 mmol/L (ref 3.5–5.1)
Sodium: 142 mmol/L (ref 135–145)
Sodium: 142 mmol/L (ref 135–145)

## 2019-09-20 LAB — HEPARIN LEVEL (UNFRACTIONATED)
Heparin Unfractionated: 0.48 IU/mL (ref 0.30–0.70)
Heparin Unfractionated: 0.35 IU/mL (ref 0.30–0.70)

## 2019-09-20 LAB — FIBRINOGEN: Fibrinogen: 630 mg/dL — ABNORMAL HIGH (ref 210–475)

## 2019-09-20 LAB — LACTIC ACID, PLASMA: Lactic Acid, Venous: 1.3 mmol/L (ref 0.5–1.9)

## 2019-09-20 LAB — APTT
aPTT: 67 seconds — ABNORMAL HIGH (ref 24–36)
aPTT: 63 seconds — ABNORMAL HIGH (ref 24–36)

## 2019-09-20 LAB — COOXEMETRY PANEL
Carboxyhemoglobin: 2.3 % — ABNORMAL HIGH (ref 0.5–1.5)
Methemoglobin: 0.7 % (ref 0.0–1.5)
O2 Saturation: 70.8 %
Total hemoglobin: 14.2 g/dL (ref 12.0–16.0)

## 2019-09-20 LAB — LACTATE DEHYDROGENASE: LDH: 424 U/L — ABNORMAL HIGH (ref 98–192)

## 2019-09-20 LAB — MAGNESIUM: Magnesium: 2.2 mg/dL (ref 1.7–2.4)

## 2019-09-20 LAB — PHOSPHORUS: Phosphorus: 4.1 mg/dL (ref 2.5–4.6)

## 2019-09-20 MED ORDER — METOCLOPRAMIDE HCL 5 MG/ML IJ SOLN: 10.0000 mg | Freq: Three times a day (TID) | INTRAMUSCULAR | Status: DC

## 2019-09-20 MED ORDER — ACETAZOLAMIDE 250 MG PO TABS
500.0000 mg | ORAL_TABLET | Freq: Two times a day (BID) | ORAL | Status: DC
  Administered 2019-09-20 – 2019-09-22 (×4): 500 mg
  Filled 2019-09-20 (×4): qty 2

## 2019-09-20 MED ORDER — METOCLOPRAMIDE HCL 5 MG/ML IJ SOLN
10.0000 mg | Freq: Four times a day (QID) | INTRAMUSCULAR | Status: DC
  Administered 2019-09-20 (×4): 10 mg via INTRAVENOUS
  Filled 2019-09-20 (×5): qty 2

## 2019-09-20 MED ORDER — POTASSIUM CHLORIDE 20 MEQ/15ML (10%) PO SOLN
40.0000 meq | Freq: Once | ORAL | Status: AC
  Administered 2019-09-20: 40 meq
  Filled 2019-09-20: qty 30

## 2019-09-20 MED ORDER — FREE WATER
200.0000 mL | Freq: Four times a day (QID) | Status: DC
  Administered 2019-09-20 – 2019-09-22 (×8): 200 mL

## 2019-09-20 MED ORDER — PANTOPRAZOLE SODIUM 40 MG PO PACK
40.0000 mg | PACK | Freq: Two times a day (BID) | ORAL | Status: DC
  Administered 2019-09-20 – 2019-10-12 (×46): 40 mg
  Filled 2019-09-20 (×45): qty 20

## 2019-09-20 MED ORDER — ALBUMIN HUMAN 5 % IV SOLN
12.5000 g | Freq: Once | INTRAVENOUS | Status: AC
  Administered 2019-09-20: 13:00:00 12.5 g via INTRAVENOUS
  Filled 2019-09-20: qty 250

## 2019-09-20 MED ORDER — ALBUMIN HUMAN 5 % IV SOLN
12.5000 g | Freq: Once | INTRAVENOUS | Status: AC
  Administered 2019-09-20: 12.5 g via INTRAVENOUS
  Filled 2019-09-20: qty 250

## 2019-09-20 NOTE — Progress Notes (Signed)
Sweep trial performed for 5 min. Vent optimized to 100% Fi02.  Dr. Haroldine Laws and Gay Filler at bedside. Abg drawn at 1015 . See result below.  Ph-7.35 CO2-77 Pa02-320 HCO3 42.9 Sats-100  Sweep trial ended at 1015 and pt place back ot 11 of sweep. No acute distress noted.

## 2019-09-20 NOTE — Progress Notes (Signed)
ANTICOAGULATION CONSULT NOTE - Follow Up Consult  Pharmacy Consult for heparin Indication: ECMO  Labs: Recent Labs    09/19/19 0336 09/19/19 0350 09/19/19 1608 09/19/19 1614 09/20/19 0324 09/20/19 0324 09/20/19 0325 09/20/19 0340 09/20/19 0340 09/20/19 0448 09/20/19 0532  HGB 9.4*   < > 10.1*   < > 9.8*   < >  --  10.2*   < > 10.2* 10.2*  HCT 33.1*   < > 35.1*   < > 33.1*   < >  --  30.0*  --  30.0* 30.0*  PLT 191  --  212  --  190  --   --   --   --   --   --   APTT 77*  --  73*  --  67*  --   --   --   --   --   --   HEPARINUNFRC 0.36  --  0.45  --   --   --  0.48  --   --   --   --   CREATININE 1.03  --  1.12  --  1.07  --   --   --   --   --   --    < > = values in this interval not displayed.    Assessment: 44yo male on ECMO heparin drip 2000 uts/hr. Heparin level therapeutic at 0.48 but at upper end, CBC and fibrinogen stable.  Goal of Therapy:  Heparin level 0.3-0.5 units/ml (aiming for lowest end)   Plan:  -Reduce heparin to 1900 units/h -Check heparin level q12h  Arrie Senate, PharmD, BCPS Clinical Pharmacist [phone] Please check AMION for all [facility] numbers 09/20/2019

## 2019-09-20 NOTE — Progress Notes (Signed)
PT Cancellation Note  Patient Details Name: [name] MRN: [id number] DOB: 1976-05-21   Cancelled Treatment:    Reason Eval/Treat Not Completed: Patient not medically ready. Pt able to open eyes on command and attempts to stick tongue out on command but unable to follow motor commands with extremities at this time. PT will follow up again on Wednesday 09/25/2019, if patient remains unable to follow motor commands then PT will sign off.   Zenaida Niece 09/20/2019, 12:15 PM

## 2019-09-20 NOTE — CV Procedure (Signed)
   ECMO NOTE:  Indication: Respiratory failure due to COVID PNA  Initial cannulation date: 08/03/2019  ECMO type: VV ECMO (Cardio-Help)  Dual lumen  Inflow/return cannula:  1) 32 FR Crescent placed 2/2  ECMO events:  Initial cannulation 12/26 Circuit changed and trach exchanged on 1/8.   Underwent addition of a RIJ drainage cannula on 1/20 Circuit changed on 1/22 for rising LDH Had evidence of recirculation and RIJ cannula pulled back on 1/26 19 FR RIJ removed 09/06/19 Underwent change to [facility] catheter with removal of femoral lines 2/2 Sweep trial today for 5 mins with O2 sats staying 100% pCO up to 77  Daily data:  Remains on VV ecmo with good flows.    Flow 4.64 RPM 3600 dP 36 Pven  -81 Sweep  11  SVO2 78%  Labs:  Hgb 10.5 LDH 297-> 365 -> 384 -> 431 -> 424 Heparin level: 0.48 Lactic acid 1.3  Plan:  Crescent cannula working well Excellent flow and sats CXR improved with diuresis. BUN rising. Give albumin  Continue to wean in 4 hour bursts then allow to rest with sedation.  Watch DTIs Sweep trial today improving. Continue daily sweep trials and vent optimization. Continue to wean sedation.   Glori Bickers, MD  8:34 AM

## 2019-09-20 NOTE — Progress Notes (Signed)
Patient ID: [name], male   DOB: May 14, 1976, 44 y.o.   MRN: [id number]    Advanced Heart Failure Rounding Note   Subjective:    Remains on VV ecmo with good flows.   Sedated on Precedex and po dilaudid but will arouse but not reliably follow commands  Remains on lasix gtt at 5. Weight down another 5 pounds.  CVP 8-9  Sweep 11  On vent at 50% TVs ~400cc  Sweep trial performed for 5 min. Vent optimized to 100% FiO2. Post sweep trial ABG Ph-7.35 CO2-77 Pa02-320 HCO3  42.9 Sats-100 TVs up to 580cc  CXR with diffuse bilateral infiltrates slightly improved today Crescent catheter well positioned  Personally reviewed   hgb 9.4 wbc10.3. Sodium 155-> 151 -> 143 -> 145 -> 142. BUN 118  ECMO parameters - see separate ecmo note   Objective:   Weight Range:  Vital Signs:   Temp:  [98.2 F (36.8 C)-99.1 F (37.3 C)] 98.6 F (37 C) (02/12 1200) Pulse Rate:  [82-98] 92 (02/12 1209) Resp:  [13-34] 19 (02/12 1209) BP: (154)/(65) 154/65 (02/12 0707) SpO2:  [95 %-100 %] 99 % (02/12 1209) Arterial Line BP: (131-162)/(52-69) 140/60 (02/12 1200) FiO2 (%):  [50 %] 50 % (02/12 1209) Weight:  [136.1 kg] 136.1 kg (02/12 0500) Last BM Date: 09/20/19  Weight change: Filed Weights   09/17/19 0600 09/19/19 0500 09/20/19 0500  Weight: (!) 141.3 kg (!) 138 kg (!) 136.1 kg    Intake/Output:   Intake/Output Summary (Last 24 hours) at 09/20/2019 1231 Last data filed at 09/20/2019 1200 Gross per 24 hour  Intake 4130.52 ml  Output 6795 ml  Net -2664.48 ml     Physical Exam: General: Sedated on vent Will arouse to voice HEENT: normal Neck: supple. RIJ cannual. Carotids 2+ bilat; no bruits. No lymphadenopathy or thryomegaly appreciated. Cor: PMI nondisplaced. Regular rate & rhythm. No rubs, gallops or murmurs. Lungs: clear with mild air movement Abdomen: obese soft, nontender, nondistended. No hepatosplenomegaly. No bruits or masses. Good bowel sounds. Extremities: no  cyanosis, clubbing, rash, 2+ edema + DTIs Neuro: Sedated on vent Will arouse to voice   Telemetry: Sinus 80-90s  Personally reviewed  Labs: Basic Metabolic Panel: Recent Labs  Lab 09/17/19 0445 09/17/19 0454 09/18/19 0334 09/18/19 0629 09/18/19 1619 09/18/19 1619 09/19/19 0336 09/19/19 0350 09/19/19 1608 09/19/19 1614 09/20/19 0324 09/20/19 0324 09/20/19 0340 09/20/19 0448 09/20/19 0532 09/20/19 1015 09/20/19 1119  NA 149*   < > 143   < > 146*  146*   < > 145   < > 143   < > 142   < > 142 142 143 144 143  K 4.1   < > 4.0   < > 4.1  4.1   < > 3.9   < > 4.4   < > 3.8   < > 3.8 4.4 4.5 4.0 4.2  CL 111   < > 107  --  104  --  100  --  98  --  97*  --   --   --   --   --   --   CO2 27   < > 31  --  31  --  34*  --  34*  --  34*  --   --   --   --   --   --   GLUCOSE 133*   < > 120*  --  153*  --  118*  --  184*  --  168*  --   --   --   --   --   --   BUN 105*   < > 102*  --  105*  --  114*  --  117*  --  118*  --   --   --   --   --   --   CREATININE 1.13   < > 1.01  --  1.16  --  1.03  --  1.12  --  1.07  --   --   --   --   --   --   CALCIUM 8.9   < > 8.7*   < > 9.0   < > 9.1  --  9.3  --  9.1  --   --   --   --   --   --   MG 2.0  --  1.8  --   --   --  2.1  --   --   --  2.2  --   --   --   --   --   --   PHOS 4.7*  --  4.4  --   --   --  4.2  --   --   --  4.1  --   --   --   --   --   --    < > = values in this interval not displayed.    Liver Function Tests: No results for input(s): AST, ALT, ALKPHOS, BILITOT, PROT, ALBUMIN in the last 168 hours. No results for input(s): LIPASE, AMYLASE in the last 168 hours. No results for input(s): AMMONIA in the last 168 hours.  CBC: Recent Labs  Lab 09/18/19 0334 09/18/19 0629 09/18/19 1619 09/18/19 1619 09/19/19 0336 09/19/19 0350 09/19/19 1608 09/19/19 1614 09/20/19 0324 09/20/19 0324 09/20/19 0340 09/20/19 0448 09/20/19 0532 09/20/19 1015 09/20/19 1119  WBC 10.5  --  11.0*  --  10.3  --  14.2*  --  11.9*  --    --   --   --   --   --   HGB 9.1*   < > 9.4*  9.9*   < > 9.4*   < > 10.1*   < > 9.8*   < > 10.2* 10.2* 10.2* 10.9* 10.5*  HCT 32.0*   < > 32.9*  29.0*   < > 33.1*   < > 35.1*   < > 33.1*   < > 30.0* 30.0* 30.0* 32.0* 31.0*  MCV 97.0  --  95.4  --  95.7  --  95.4  --  93.5  --   --   --   --   --   --   PLT 163  --  190  --  191  --  212  --  190  --   --   --   --   --   --    < > = values in this interval not displayed.    Cardiac Enzymes: No results for input(s): CKTOTAL, CKMB, CKMBINDEX, TROPONINI in the last 168 hours.  BNP: BNP (last 3 results) Recent Labs    07/22/19 1039  BNP 15.3    ProBNP (last 3 results) No results for input(s): PROBNP in the last 8760 hours.    Other results:  Imaging: DG CHEST PORT 1 VIEW  Result Date: 09/20/2019 CLINICAL DATA:  Check tracheostomy tube placement EXAM: PORTABLE CHEST 1 VIEW  COMPARISON:  09/19/2019 FINDINGS: Cardiac shadow remains enlarged. ECMO cannula is again identified and stable. Tracheostomy tube and bilateral thoracostomy catheters are again noted and stable. The overall inspiratory effort is poor. No pneumothorax is seen. Diffuse patchy infiltrates are noted bilaterally relatively stable from the prior exam. No new focal abnormality is noted. IMPRESSION: Tubes and lines as described. Bilateral opacities relatively stable from the prior study. Electronically Signed   By: Inez Catalina M.D.   On: 09/20/2019 08:36   DG CHEST PORT 1 VIEW  Result Date: 09/19/2019 CLINICAL DATA:  Follow-up exam. EXAM: PORTABLE CHEST 1 VIEW COMPARISON:  09/18/2019 FINDINGS: Bilateral interstitial and airspace lung opacities are similar to the previous day's study allowing for differences in patient positioning and technique. No new lung abnormalities.  No pneumothorax. Bilateral chest tubes, tracheostomy tube, right PICC and left subclavian central venous line are stable. IMPRESSION: 1. No significant change from the previous day's exam. 2. Persistent  bilateral interstitial and airspace lung opacities. 3. No pneumothorax. 4. Stable chest tubes and other support apparatus. Electronically Signed   By: Lajean Manes M.D.   On: 09/19/2019 09:02     Medications:     Scheduled Medications: . acetaZOLAMIDE  500 mg Per Tube BID  . amiodarone  200 mg Oral Daily  . vitamin C  500 mg Per Tube Daily  . chlorhexidine gluconate (MEDLINE KIT)  15 mL Mouth Rinse BID  . Chlorhexidine Gluconate Cloth  6 each Topical Daily  . diazepam  15 mg Oral Q6H   Followed by  . [START ON 09/22/2019] diazepam  10 mg Oral Q6H  . feeding supplement (OSMOLITE 1.5 CAL)  237 mL Per Tube 6 X Daily  . feeding supplement (PRO-STAT SUGAR FREE 64)  60 mL Per Tube QID  . free water  200 mL Per Tube Q6H  . gabapentin  200 mg Per Tube Q8H  . hydrALAZINE  10 mg Per Tube Q8H  . HYDROmorphone  12 mg Oral Q6H   Followed by  . [START ON 09/22/2019] HYDROmorphone  8 mg Oral Q6H   Followed by  . [START ON 09/25/2019] HYDROmorphone  4 mg Oral Q6H   Followed by  . [START ON 09/28/2019] HYDROmorphone  2 mg Oral Q6H  . insulin aspart  0-20 Units Subcutaneous Q4H  . ipratropium-albuterol  3 mL Nebulization Q4H  . mouth rinse  15 mL Mouth Rinse 10 times per day  . metoCLOPramide (REGLAN) injection  10 mg Intravenous Q6H  . montelukast  10 mg Per Tube QHS  . nutrition supplement (JUVEN)  1 packet Per Tube BID BM  . pantoprazole sodium  40 mg Per Tube BID  . propranolol  40 mg Per Tube TID  . psyllium  1 packet Oral BID  . QUEtiapine  50 mg Per Tube BID  . sodium chloride flush  10-40 mL Intracatheter Q12H  . white petrolatum   Topical BID    Infusions: . sodium chloride    . sodium chloride Stopped (09/05/19 1900)  . sodium chloride 10 mL/hr at 09/11/19 2156  . dexmedetomidine (PRECEDEX) IV infusion 0.2 mcg/kg/hr (09/20/19 1202)  . furosemide (LASIX) infusion 5 mg/hr (09/20/19 1200)  . heparin 1,900 Units/hr (09/20/19 1200)    PRN Medications: Place/Maintain arterial line  **AND** sodium chloride, sodium chloride, sodium chloride, acetaminophen (TYLENOL) oral liquid 160 mg/5 mL, acetaminophen, hydrALAZINE, HYDROmorphone **FOLLOWED BY** [START ON 09/22/2019] HYDROmorphone **FOLLOWED BY** [START ON 09/25/2019] HYDROmorphone **FOLLOWED BY** [START ON 09/28/2019] HYDROmorphone **FOLLOWED BY** [START ON  10/01/2019] HYDROmorphone, magic mouthwash, magnesium hydroxide, ondansetron (ZOFRAN) IV, senna-docusate, sodium chloride   Assessment/Plan:    1. Acute hypoxic/hypercarbic respiratory failure due to COVID PNA: Remained markedly hypoxic despite full support. Intubated 12/26. S/p bilateral CTs 12/26 for pneumothoraces.  TEE on 12/26 LVEF 70% RV ok. VV ECMO begun 12/26. Tracheostomy 12/29.  COVID ARDS at this point. ECMO circuit changed 1/8 & 1/23. Crescent cannula placed 2/2 (femoral cannuals removed) - ECMO parameters as per ECMO note - Flows look  good on Crescent. Diuresed aggressively overnight. CXR improved today. Sweep 12 Decreased lasix gtt to 5  D/w CCM - LDH 289-> 365 -> 384  -> 431 -> 424 - Sweep trial today much improved from previous. Will continue to try to wean. Daily sweep trials with vent optimzation - Continue heparin. No bleeding. Discussed dosing with PharmD personally. - Continue chest PT - Continue regular episodes of awakening and letting him work interspersed with sedation and rest periods.  2. Bilateral PTX: Due to barotrauma.  - Resolved  3. COVID PNA: management of COVID infection per CCM. CXR with bilateral multifocal PNA progressing to likely ARDS.  - s/p 10 days of remdesivir - 12/16, 12/2 s/p tociluzimab - 12/17 s/p convalescent plasma - Decadron at 45m/daily completed 1/11 - Completed cefepime/vanc - Off Ancef  4. ID: Empiric coverage with vancomycin/cefepime initially for possible secondary bacterial PNA, course completed.   - treated with cefepime and vanc for + cx with staph epidimidis, MSSA and serratia (sensitivities ok). -  Currently off abx - BAL cx. With serratia and enterococcus felt to be colonization - No fevere WBC stable   5. Anemia: - hgb 10.5 oday. Transfuse as needed to keep Hgb> 8.0  7. FEN - now with G-tube. Resume TF  8. Hypernatremia - NA 155 -> 143 -> 145 -> 142 - Continue FW at current rate. Hopefully can decrease slightly soon   9. AKI - Back on lasix gtt with good output  - he is 3rd spacing. Continue unna boots.  - Creatinine improved BUN > 100 -  Possibility of CVVHD discussed on rounds also d/w Renal. No current indication for HD. D/w Renal. Would consider CVVHD once sedation weaned and felt to be uremic - will give some albumin to try to reduce azotemia   10. DTIs - WOC following.   D/w with ECMO team (ECMO coordinator/specialists, CCM, TCTS and pharmD) at bedside on multidisciplinary rounds. Continue to await ARDS recovery. Details as above.   Length of Stay: 60  CRITICAL CARE Performed by: BGlori Bickers Total critical care time: 40  minutes  Critical care time was exclusive of separately billable procedures and treating other patients.  Critical care was necessary to treat or prevent imminent or life-threatening deterioration.  Critical care was time spent personally by me (independent of midlevel providers or residents) on the following activities: development of treatment plan with patient and/or surrogate as well as nursing, discussions with consultants, evaluation of patient's response to treatment, examination of patient, obtaining history from patient or surrogate, ordering and performing treatments and interventions, ordering and review of laboratory studies, ordering and review of radiographic studies, pulse oximetry and re-evaluation of patient's condition.    DGlori BickersMD 09/20/2019, 12:31 PM  Advanced Heart Failure Team Pager [phone](M-F; 7a - 4p)  Please contact [email or web address] for night-coverage after hours (4p -7a ) and weekends on  [email or web address]

## 2019-09-20 NOTE — Progress Notes (Signed)
Pt having frequent coughing and frequently opening eyes. Does not appear to be resting. Increased bouts of coughing is causing low flow alarms on ECMO circuit. Increased precedex gtt to ensure adequate rest and prevent interruptions in ECMO flow.

## 2019-09-20 NOTE — Progress Notes (Signed)
ANTICOAGULATION CONSULT NOTE - Follow Up Consult  Pharmacy Consult for heparin Indication: ECMO  Labs: Recent Labs    09/19/19 0336 09/19/19 0350 09/19/19 1608 09/19/19 1614 09/20/19 0324 09/20/19 0325 09/20/19 0340 09/20/19 1119 09/20/19 1119 09/20/19 1649 09/20/19 1656  HGB 9.4*   < > 10.1*   < > 9.8*  --    < > 10.5*   < > 10.2* 9.4*  HCT 33.1*   < > 35.1*   < > 33.1*  --    < > 31.0*  --  30.0* 32.5*  PLT 191   < > 212  --  190  --   --   --   --   --  190  APTT 77*   < > 73*  --  67*  --   --   --   --   --  63*  HEPARINUNFRC 0.36   < > 0.45  --   --  0.48  --   --   --   --  0.35  CREATININE 1.03  --  1.12  --  1.07  --   --   --   --   --   --    < > = values in this interval not displayed.    Assessment: 44yo male on ECMO heparin drip 1900 units/hr. Heparin level therapeutic at 0.35, Hgb down to 9.4, no bleeding noted.  Goal of Therapy:  Heparin level 0.3-0.5 units/ml (aiming for lowest end)   Plan:  -Continue heparin drip at 1900 units/hr -Check heparin level q12h -Monitor for s/sx of bleeding  Thank you for involving pharmacy in this patient's care.  Renold Genta, PharmD, BCPS Clinical Pharmacist Clinical phone for 09/20/2019 until 10p is [phone] 09/20/2019 6:08 PM  **Pharmacist phone directory can be found on [email or web address] listed under [place]**

## 2019-09-20 NOTE — Progress Notes (Signed)
NAME:  [name], MRN:  [id number], DOB:  08/25/1975, LOS: 89 ADMISSION DATE:  07/22/2019, CONSULTATION DATE:  12/24 REFERRING MD:  Sloan Leiter, CHIEF COMPLAINT:  Dyspnea   Brief History   44 y/o M admitted 12/14 with COVID pneumonia causing acute hypoxemic respiratory failure. Developed pneumomediastinum 12/23 with concern for possible bilateral small pneumothoraces on 12/24.  PCCM consulted for evaluation of pneumothoraces  Past Medical History  GERD HTN Asthma  Significant Hospital Events   12/14 admit with hypoxemic respiratory failure in setting of COVID-19 pneumonia 12/23 noted to have pneumomediastinum on chest x-ray 12/24 PCCM consulted for evaluation 12/26 worsening hypoxemia, intubated  12/27 VV fem/fem ECMO cannulation See bedside nursing event notes for full rundown of procedures after 12/27 1/30 improved cxr, improved lung compliance, TV 350s on 15PC  1/31 CXR increased infiltrates, +CFB     2/2 Converted to right IJ Crescent cannula. 2/3 PEG tube inserted at bedside  Consults:  PCCM, CTS, CHF, Palliative  Significant Diagnostic Tests:   CT chest 12/22 >> extensive pneumomediastinum, multifocal patchy bilateral groundglass opacities CXR 1/25 >> improving bilateral infiltrates. ECMO cannulae in place. CXR 2/5 >> clearing bilateral infiltrates CT chest/abd/pelvis 1/28: 1. Dense consolidative pneumonia throughout both lungs. 2. No acute abnormality of the abdomen or pelvis. 3. Slight subcutaneous edema in the proximal left thigh and in the subcutaneous fat at the left lower quadrant of the abdomen of unknown etiology. cth 1/28: 1. Unremarkable CT appearance of the brain. 2. Pansinusitis.  Large bilateral mastoid and middle ear effusions. Micro Data:  BCx2 12/14 >> negative  Sputum 12/15 >> negative  1/9 bronch: serratia and MSSA 1/2 acid fast smear bronch: negative 1/2 fungal cx bronch: pending 1/12 BAL: serratia 1/18 BAL >> rare Serratia. 2/1 BAL >> rare  Serratia - few WBC 2/9 resp: serratia Antimicrobials:  Received remdesivir, steroids, actemra and convalescent plasma  5 days cefepime 12/24-12/29 Cefepime 1/4->1/14 vanc 1/4->1/14 Ceftriaxone 1/14 >>1/27 Cefazolin 1/28>> 02/2   Interim history/subjective:  2/12: drowsy this am, weakly attempting to follow commands for nursing. Unable to illicit myself, but does open eyes spontaneously and seemingly tracks. Has had some blood discharge at peg and from oral and trach per overnight team.  2/11:Bronchoscopy yesterday showed only minimal secretions.  Bloody secretion since then.  Followed commands for nursing staff.  Objective   Blood pressure (!) 154/65, pulse 91, temperature 98.2 F (36.8 C), temperature source Other (Comment), resp. rate 16, height _0  (1.727 m), weight (!) 136.1 kg, SpO2 99 %.    Vent Mode: PCV FiO2 (%):  [50 %] 50 % Set Rate:  [15 bmp] 15 bmp PEEP:  [16 cmH20] 16 cmH20 Plateau Pressure:  [29 cmH20-32 cmH20] 29 cmH20   Intake/Output Summary (Last 24 hours) at 09/20/2019 0955 Last data filed at 09/20/2019 0900 Gross per 24 hour  Intake 3694.55 ml  Output 6895 ml  Net -3200.45 ml   Filed Weights   09/17/19 0600 09/19/19 0500 09/20/19 0500  Weight: (!) 141.3 kg (!) 138 kg (!) 136.1 kg   Examination GEN: ill obese man on vent and ecmo circuit via RIJ cannulation HEENT: trach in place, no further bleeding around tracheostomy but secretions bloody from tracheostomy as well as oral CV: distant, ext warm PULM: improved vesicular breath sounds anteriorly, no wheezing. Bronchial breath sounds have receded to mid-axillary line.  Breath stacking has improved. Still abdominal breathing when awake. GI: obese, Soft, +BS EXT: diffuse anasarca improved but present NEURO: opens eyes spontaneously, occasional coughing  on vent. PSYCH: deferred SKIN: slightly pale.  Areas of skin breakdown from support devices.     Flow (LPM): 4.52 ECMO Mode: VV ECMO Device:  Cardiohelp  Vent Mode: PCV, Set Rate: 15 bmp, FiO2 (%): 50 %, I Time: 0.81 Sec(s), PEEP: 16 cmH20 Vt today 350-468m  Net IO Since Admission: 35,080.7 mL [09/20/19 0955]  Resolved Hospital Problem list   MSSA and serratia marcescens pneumonia  Epistaxis Atrial fibrillation Assessment & Plan:   Acute hypoxemic respiratory failure with ARDS secondary to COVID-19, status post tracheostomy on mechanical ventilation and VV ECMO support.  Slowly resolving ARDS. Limited respiratory reserve with persistently high burden of disease, in keeping with natural history of COVID related ARDS.  Generally easier flow following cannula change. - No bronchoscopy today - Run lower heparin to allow bronchoscopy induced airway trauma to subside as well as gastric contents/drainage -LDH stable around 420 - Continue lung protective ventilation.  - Continue to diurese on lasix infusion and good ouput - Allow periods of more spontaneous breathing alternating with rest.  Allow for uninterrupted sleep at nighttime  Hypernatremia - resolved - change free water to 200q6h -d/c  0.45% saline. - metamucil to bulk stool to avoid free water loss  Concern for intolerance of bolus feeds:  -adding reglan for trial for gut motility   Blood tinged gi drainage/contents:  - change ppi to BID, should increase in blood would change to IV from PO  Hypertension - Increase propranolol.  Daily Goals Checklist   Pain/Anxiety/Delirium protocol (if indicated): keep infusions off. Continue enteral agents. Dexmedetomidine and prn Versed and Dilaudid. Begin to taper enteral sedative.  VAP protocol (if indicated): Bundle in place. Respiratory support goals: Pressure control ventilation with ultra protective lung ventilation. Titrate FiO2 down again. Continue PEEP 16  Blood pressure target:  MAP>65, off pressors DVT prophylaxis: Remains on systemic heparin - target Heparin level 0.3-0.7 Nutrition Status: Nutrition Problem:  Increased nutrient needs Etiology: acute illness(COVID PNA) Signs/Symptoms: estimated needs Interventions: Tube feeding, Prostat  Bolus feed. GI prophylaxis: Protonix per tube, change to BID today Fluid status goals: cont lasix infusion Urinary catheter: Guide hemodynamic management Glucose control: Euglycemic control with SSI Mobility/therapy needs: Bedrest.  Daily labs: as per ECMO protocol. Code Status: Full code  Family Communication:family updated by RN. Disposition: ICU  Labs   CBC: CBC Latest Ref Rng & Units 09/20/2019 09/20/2019 09/20/2019  WBC 4.0 - 10.5 K/uL - - -  Hemoglobin 13.0 - 17.0 g/dL 10.2(L) 10.2(L) 10.2(L)  Hematocrit 39.0 - 52.0 % 30.0(L) 30.0(L) 30.0(L)  Platelets 150 - 400 K/uL - - -    Basic Metabolic Panel: BMP Latest Ref Rng & Units 09/20/2019 09/20/2019 09/20/2019  Glucose 70 - 99 mg/dL - - -  BUN 6 - 20 mg/dL - - -  Creatinine 0.61 - 1.24 mg/dL - - -  Sodium 135 - 145 mmol/L 143 142 142  Potassium 3.5 - 5.1 mmol/L 4.5 4.4 3.8  Chloride 98 - 111 mmol/L - - -  CO2 22 - 32 mmol/L - - -  Calcium 8.9 - 10.3 mg/dL - - -    GFR: Estimated Creatinine Clearance: 117.8 mL/min (by C-G formula based on SCr of 1.07 mg/dL). Recent Labs  Lab 09/17/19 0445 09/17/19 1624 09/18/19 0334 09/18/19 0334 09/18/19 1619 09/19/19 0336 09/19/19 1608 09/20/19 0324  WBC 11.6*   < > 10.5   < > 11.0* 10.3 14.2* 11.9*  LATICACIDVEN 0.9  --  1.1  --   --  1.3  --  1.3   < > = values in this interval not displayed.    Liver Function Tests: No results for input(s): AST, ALT, ALKPHOS, BILITOT, PROT, ALBUMIN in the last 168 hours. No results for input(s): LIPASE, AMYLASE in the last 168 hours. No results for input(s): AMMONIA in the last 168 hours.  ABG    Component Value Date/Time   PHART 7.472 (H) 09/20/2019 0532   PCO2ART 53.9 (H) 09/20/2019 0532   PO2ART 84.0 09/20/2019 0532   HCO3 39.5 (H) 09/20/2019 0532   TCO2 41 (H) 09/20/2019 0532   ACIDBASEDEF 4.0 (H)  09/16/2019 2334   O2SAT 97.0 09/20/2019 0532     Coagulation Profile: No results for input(s): INR, PROTIME in the last 168 hours.  Cardiac Enzymes: No results for input(s): CKTOTAL, CKMB, CKMBINDEX, TROPONINI in the last 168 hours.  HbA1C: Hgb A1c MFr Bld  Date/Time Value Ref Range Status  07/24/2019 04:50 AM 6.7 (H) 4.8 - 5.6 % Final    Comment:    (NOTE) Pre diabetes:          5.7%-6.4% Diabetes:              >6.4% Glycemic control for   <7.0% adults with diabetes   09/30/2017 05:36 AM 5.7 (H) 4.8 - 5.6 % Final    Comment:    (NOTE)         Prediabetes: 5.7 - 6.4         Diabetes: >6.4         Glycemic control for adults with diabetes: <7.0     CBG: Recent Labs  Lab 09/19/19 1612 09/19/19 1939 09/19/19 2324 09/20/19 0338 09/20/19 0753  GLUCAP 170* 168* 152* 174* 148*     CRITICAL CARE Performed by: Audria Nine   Critical care time: The patient is critically ill with multiple organ systems failure and requires high complexity decision making for assessment and support, frequent evaluation and titration of therapies, application of advanced monitoring technologies and extensive interpretation of multiple databases.  Critical care time 46 mins. This represents my time independent of the NPs time taking care of the pt. This is excluding procedures.    [facility] 09/20/2019, 9:55 AM

## 2019-09-21 ENCOUNTER — Inpatient Hospital Stay (HOSPITAL_COMMUNITY): Payer: Managed Care, Other (non HMO)

## 2019-09-21 LAB — POCT I-STAT 7, (LYTES, BLD GAS, ICA,H+H)
Acid-Base Excess: 11 mmol/L — ABNORMAL HIGH (ref 0.0–2.0)
Acid-Base Excess: 11 mmol/L — ABNORMAL HIGH (ref 0.0–2.0)
Acid-Base Excess: 13 mmol/L — ABNORMAL HIGH (ref 0.0–2.0)
Acid-Base Excess: 14 mmol/L — ABNORMAL HIGH (ref 0.0–2.0)
Acid-Base Excess: 14 mmol/L — ABNORMAL HIGH (ref 0.0–2.0)
Acid-Base Excess: 14 mmol/L — ABNORMAL HIGH (ref 0.0–2.0)
Acid-Base Excess: 14 mmol/L — ABNORMAL HIGH (ref 0.0–2.0)
Acid-Base Excess: 15 mmol/L — ABNORMAL HIGH (ref 0.0–2.0)
Acid-Base Excess: 16 mmol/L — ABNORMAL HIGH (ref 0.0–2.0)
Bicarbonate: 36.2 mmol/L — ABNORMAL HIGH (ref 20.0–28.0)
Bicarbonate: 37.1 mmol/L — ABNORMAL HIGH (ref 20.0–28.0)
Bicarbonate: 39.4 mmol/L — ABNORMAL HIGH (ref 20.0–28.0)
Bicarbonate: 39.7 mmol/L — ABNORMAL HIGH (ref 20.0–28.0)
Bicarbonate: 40.1 mmol/L — ABNORMAL HIGH (ref 20.0–28.0)
Bicarbonate: 40.5 mmol/L — ABNORMAL HIGH (ref 20.0–28.0)
Bicarbonate: 40.6 mmol/L — ABNORMAL HIGH (ref 20.0–28.0)
Bicarbonate: 40.7 mmol/L — ABNORMAL HIGH (ref 20.0–28.0)
Bicarbonate: 41.2 mmol/L — ABNORMAL HIGH (ref 20.0–28.0)
Calcium, Ion: 1.21 mmol/L (ref 1.15–1.40)
Calcium, Ion: 1.22 mmol/L (ref 1.15–1.40)
Calcium, Ion: 1.23 mmol/L (ref 1.15–1.40)
Calcium, Ion: 1.23 mmol/L (ref 1.15–1.40)
Calcium, Ion: 1.24 mmol/L (ref 1.15–1.40)
Calcium, Ion: 1.24 mmol/L (ref 1.15–1.40)
Calcium, Ion: 1.24 mmol/L (ref 1.15–1.40)
Calcium, Ion: 1.25 mmol/L (ref 1.15–1.40)
Calcium, Ion: 1.27 mmol/L (ref 1.15–1.40)
HCT: 24 % — ABNORMAL LOW (ref 39.0–52.0)
HCT: 27 % — ABNORMAL LOW (ref 39.0–52.0)
HCT: 28 % — ABNORMAL LOW (ref 39.0–52.0)
HCT: 28 % — ABNORMAL LOW (ref 39.0–52.0)
HCT: 29 % — ABNORMAL LOW (ref 39.0–52.0)
HCT: 29 % — ABNORMAL LOW (ref 39.0–52.0)
HCT: 30 % — ABNORMAL LOW (ref 39.0–52.0)
HCT: 30 % — ABNORMAL LOW (ref 39.0–52.0)
HCT: 30 % — ABNORMAL LOW (ref 39.0–52.0)
Hemoglobin: 10.2 g/dL — ABNORMAL LOW (ref 13.0–17.0)
Hemoglobin: 10.2 g/dL — ABNORMAL LOW (ref 13.0–17.0)
Hemoglobin: 10.2 g/dL — ABNORMAL LOW (ref 13.0–17.0)
Hemoglobin: 8.2 g/dL — ABNORMAL LOW (ref 13.0–17.0)
Hemoglobin: 9.2 g/dL — ABNORMAL LOW (ref 13.0–17.0)
Hemoglobin: 9.5 g/dL — ABNORMAL LOW (ref 13.0–17.0)
Hemoglobin: 9.5 g/dL — ABNORMAL LOW (ref 13.0–17.0)
Hemoglobin: 9.9 g/dL — ABNORMAL LOW (ref 13.0–17.0)
Hemoglobin: 9.9 g/dL — ABNORMAL LOW (ref 13.0–17.0)
O2 Saturation: 100 %
O2 Saturation: 95 %
O2 Saturation: 97 %
O2 Saturation: 98 %
O2 Saturation: 98 %
O2 Saturation: 98 %
O2 Saturation: 99 %
O2 Saturation: 99 %
O2 Saturation: 99 %
Patient temperature: 36.8
Patient temperature: 36.8
Patient temperature: 36.9
Patient temperature: 37
Patient temperature: 98.6
Patient temperature: 98.7
Patient temperature: 98.7
Patient temperature: 98.7
Patient temperature: 99.3
Potassium: 3.6 mmol/L (ref 3.5–5.1)
Potassium: 3.7 mmol/L (ref 3.5–5.1)
Potassium: 3.7 mmol/L (ref 3.5–5.1)
Potassium: 4.1 mmol/L (ref 3.5–5.1)
Potassium: 4.1 mmol/L (ref 3.5–5.1)
Potassium: 4.2 mmol/L (ref 3.5–5.1)
Potassium: 4.4 mmol/L (ref 3.5–5.1)
Potassium: 4.4 mmol/L (ref 3.5–5.1)
Potassium: 4.6 mmol/L (ref 3.5–5.1)
Sodium: 145 mmol/L (ref 135–145)
Sodium: 146 mmol/L — ABNORMAL HIGH (ref 135–145)
Sodium: 146 mmol/L — ABNORMAL HIGH (ref 135–145)
Sodium: 146 mmol/L — ABNORMAL HIGH (ref 135–145)
Sodium: 147 mmol/L — ABNORMAL HIGH (ref 135–145)
Sodium: 148 mmol/L — ABNORMAL HIGH (ref 135–145)
Sodium: 148 mmol/L — ABNORMAL HIGH (ref 135–145)
Sodium: 148 mmol/L — ABNORMAL HIGH (ref 135–145)
Sodium: 148 mmol/L — ABNORMAL HIGH (ref 135–145)
TCO2: 38 mmol/L — ABNORMAL HIGH (ref 22–32)
TCO2: 39 mmol/L — ABNORMAL HIGH (ref 22–32)
TCO2: 41 mmol/L — ABNORMAL HIGH (ref 22–32)
TCO2: 41 mmol/L — ABNORMAL HIGH (ref 22–32)
TCO2: 42 mmol/L — ABNORMAL HIGH (ref 22–32)
TCO2: 42 mmol/L — ABNORMAL HIGH (ref 22–32)
TCO2: 42 mmol/L — ABNORMAL HIGH (ref 22–32)
TCO2: 43 mmol/L — ABNORMAL HIGH (ref 22–32)
TCO2: 43 mmol/L — ABNORMAL HIGH (ref 22–32)
pCO2 arterial: 50.9 mmHg — ABNORMAL HIGH (ref 32.0–48.0)
pCO2 arterial: 54 mmHg — ABNORMAL HIGH (ref 32.0–48.0)
pCO2 arterial: 54 mmHg — ABNORMAL HIGH (ref 32.0–48.0)
pCO2 arterial: 55.5 mmHg — ABNORMAL HIGH (ref 32.0–48.0)
pCO2 arterial: 56.9 mmHg — ABNORMAL HIGH (ref 32.0–48.0)
pCO2 arterial: 57.2 mmHg — ABNORMAL HIGH (ref 32.0–48.0)
pCO2 arterial: 57.8 mmHg — ABNORMAL HIGH (ref 32.0–48.0)
pCO2 arterial: 60.8 mmHg — ABNORMAL HIGH (ref 32.0–48.0)
pCO2 arterial: 70.2 mmHg (ref 32.0–48.0)
pH, Arterial: 7.371 (ref 7.350–7.450)
pH, Arterial: 7.431 (ref 7.350–7.450)
pH, Arterial: 7.432 (ref 7.350–7.450)
pH, Arterial: 7.448 (ref 7.350–7.450)
pH, Arterial: 7.449 (ref 7.350–7.450)
pH, Arterial: 7.46 — ABNORMAL HIGH (ref 7.350–7.450)
pH, Arterial: 7.467 — ABNORMAL HIGH (ref 7.350–7.450)
pH, Arterial: 7.474 — ABNORMAL HIGH (ref 7.350–7.450)
pH, Arterial: 7.485 — ABNORMAL HIGH (ref 7.350–7.450)
pO2, Arterial: 104 mmHg (ref 83.0–108.0)
pO2, Arterial: 106 mmHg (ref 83.0–108.0)
pO2, Arterial: 111 mmHg — ABNORMAL HIGH (ref 83.0–108.0)
pO2, Arterial: 112 mmHg — ABNORMAL HIGH (ref 83.0–108.0)
pO2, Arterial: 119 mmHg — ABNORMAL HIGH (ref 83.0–108.0)
pO2, Arterial: 135 mmHg — ABNORMAL HIGH (ref 83.0–108.0)
pO2, Arterial: 187 mmHg — ABNORMAL HIGH (ref 83.0–108.0)
pO2, Arterial: 74 mmHg — ABNORMAL LOW (ref 83.0–108.0)
pO2, Arterial: 86 mmHg (ref 83.0–108.0)

## 2019-09-21 LAB — MAGNESIUM: Magnesium: 2.4 mg/dL (ref 1.7–2.4)

## 2019-09-21 LAB — BASIC METABOLIC PANEL
Anion gap: 12 (ref 5–15)
Anion gap: 10 (ref 5–15)
BUN: 134 mg/dL — ABNORMAL HIGH (ref 6–20)
BUN: 147 mg/dL — ABNORMAL HIGH (ref 6–20)
CO2: 36 mmol/L — ABNORMAL HIGH (ref 22–32)
CO2: 34 mmol/L — ABNORMAL HIGH (ref 22–32)
Calcium: 9.1 mg/dL (ref 8.9–10.3)
Calcium: 9.1 mg/dL (ref 8.9–10.3)
Chloride: 99 mmol/L (ref 98–111)
Chloride: 103 mmol/L (ref 98–111)
Creatinine, Ser: 1.01 mg/dL (ref 0.61–1.24)
Creatinine, Ser: 1.08 mg/dL (ref 0.61–1.24)
GFR calc Af Amer: >60 mL/min (ref >60)
GFR calc Af Amer: >60 mL/min (ref >60)
GFR calc non Af Amer: >60 mL/min (ref >60)
GFR calc non Af Amer: >60 mL/min (ref >60)
Glucose, Bld: 116 mg/dL — ABNORMAL HIGH (ref 70–99)
Glucose, Bld: 271 mg/dL — ABNORMAL HIGH (ref 70–99)
Potassium: 3.6 mmol/L (ref 3.5–5.1)
Potassium: 4.6 mmol/L (ref 3.5–5.1)
Sodium: 147 mmol/L — ABNORMAL HIGH (ref 135–145)
Sodium: 147 mmol/L — ABNORMAL HIGH (ref 135–145)

## 2019-09-21 LAB — CULTURE, RESPIRATORY W GRAM STAIN: Special Requests: NORMAL

## 2019-09-21 LAB — CBC
HCT: 31.1 % — ABNORMAL LOW (ref 39.0–52.0)
HCT: 29.5 % — ABNORMAL LOW (ref 39.0–52.0)
HCT: 28.1 % — ABNORMAL LOW (ref 39.0–52.0)
Hemoglobin: 9 g/dL — ABNORMAL LOW (ref 13.0–17.0)
Hemoglobin: 8.4 g/dL — ABNORMAL LOW (ref 13.0–17.0)
Hemoglobin: 8.1 g/dL — ABNORMAL LOW (ref 13.0–17.0)
MCH: 27.2 pg (ref 26.0–34.0)
MCH: 27.3 pg (ref 26.0–34.0)
MCH: 27.9 pg (ref 26.0–34.0)
MCHC: 28.9 g/dL — ABNORMAL LOW (ref 30.0–36.0)
MCHC: 28.5 g/dL — ABNORMAL LOW (ref 30.0–36.0)
MCHC: 28.8 g/dL — ABNORMAL LOW (ref 30.0–36.0)
MCV: 94 fL (ref 80.0–100.0)
MCV: 95.8 fL (ref 80.0–100.0)
MCV: 96.9 fL (ref 80.0–100.0)
Platelets: 172 10*3/uL (ref 150–400)
Platelets: 169 10*3/uL (ref 150–400)
Platelets: 160 10*3/uL (ref 150–400)
RBC: 3.31 MIL/uL — ABNORMAL LOW (ref 4.22–5.81)
RBC: 3.08 MIL/uL — ABNORMAL LOW (ref 4.22–5.81)
RBC: 2.9 MIL/uL — ABNORMAL LOW (ref 4.22–5.81)
RDW: 17.3 % — ABNORMAL HIGH (ref 11.5–15.5)
RDW: 17.3 % — ABNORMAL HIGH (ref 11.5–15.5)
RDW: 17.2 % — ABNORMAL HIGH (ref 11.5–15.5)
WBC: 12.2 10*3/uL — ABNORMAL HIGH (ref 4.0–10.5)
WBC: 13.4 10*3/uL — ABNORMAL HIGH (ref 4.0–10.5)
WBC: 11.3 10*3/uL — ABNORMAL HIGH (ref 4.0–10.5)
nRBC: 0 % (ref 0.0–0.2)
nRBC: 0 % (ref 0.0–0.2)
nRBC: 0 % (ref 0.0–0.2)

## 2019-09-21 LAB — GLUCOSE, CAPILLARY
Glucose-Capillary: 219 mg/dL — ABNORMAL HIGH (ref 70–99)
Glucose-Capillary: 135 mg/dL — ABNORMAL HIGH (ref 70–99)
Glucose-Capillary: 165 mg/dL — ABNORMAL HIGH (ref 70–99)
Glucose-Capillary: 182 mg/dL — ABNORMAL HIGH (ref 70–99)
Glucose-Capillary: 242 mg/dL — ABNORMAL HIGH (ref 70–99)
Glucose-Capillary: 301 mg/dL — ABNORMAL HIGH (ref 70–99)

## 2019-09-21 LAB — PHOSPHORUS: Phosphorus: 3.9 mg/dL (ref 2.5–4.6)

## 2019-09-21 LAB — FIBRINOGEN: Fibrinogen: 557 mg/dL — ABNORMAL HIGH (ref 210–475)

## 2019-09-21 LAB — APTT
aPTT: 84 seconds — ABNORMAL HIGH (ref 24–36)
aPTT: 99 seconds — ABNORMAL HIGH (ref 24–36)

## 2019-09-21 LAB — PREPARE RBC (CROSSMATCH)

## 2019-09-21 LAB — COOXEMETRY PANEL
Carboxyhemoglobin: 2.6 % — ABNORMAL HIGH (ref 0.5–1.5)
Methemoglobin: 0.7 % (ref 0.0–1.5)
O2 Saturation: 80.5 %
Total hemoglobin: 10.1 g/dL — ABNORMAL LOW (ref 12.0–16.0)

## 2019-09-21 LAB — HEPARIN LEVEL (UNFRACTIONATED)
Heparin Unfractionated: 0.46 IU/mL (ref 0.30–0.70)
Heparin Unfractionated: 0.44 IU/mL (ref 0.30–0.70)

## 2019-09-21 LAB — LACTIC ACID, PLASMA: Lactic Acid, Venous: 0.9 mmol/L (ref 0.5–1.9)

## 2019-09-21 LAB — LACTATE DEHYDROGENASE: LDH: 379 U/L — ABNORMAL HIGH (ref 98–192)

## 2019-09-21 MED ORDER — ALBUMIN HUMAN 5 % IV SOLN: 12.5000 g | Freq: Once | INTRAVENOUS | Status: AC

## 2019-09-21 MED ORDER — HYDROMORPHONE HCL 2 MG PO TABS
4.0000 mg | ORAL_TABLET | Freq: Four times a day (QID) | ORAL | Status: AC
  Administered 2019-09-25 – 2019-09-28 (×12): 4 mg via ORAL
  Filled 2019-09-21 (×14): qty 2

## 2019-09-21 MED ORDER — DIAZEPAM 5 MG PO TABS
10.0000 mg | ORAL_TABLET | Freq: Four times a day (QID) | ORAL | Status: DC
  Administered 2019-09-22 – 2019-09-23 (×3): 10 mg via ORAL
  Filled 2019-09-21 (×3): qty 2

## 2019-09-21 MED ORDER — HYDROMORPHONE HCL 2 MG PO TABS
9.0000 mg | ORAL_TABLET | Freq: Four times a day (QID) | ORAL | Status: AC
  Administered 2019-09-21 – 2019-09-22 (×4): 9 mg via ORAL
  Filled 2019-09-21 (×4): qty 5

## 2019-09-21 MED ORDER — ALBUMIN HUMAN 5 % IV SOLN
INTRAVENOUS | Status: AC
  Administered 2019-09-21: 12.5 g via INTRAVENOUS
  Filled 2019-09-21: qty 250

## 2019-09-21 MED ORDER — ALBUMIN HUMAN 5 % IV SOLN
12.5000 g | Freq: Once | INTRAVENOUS | Status: AC
  Administered 2019-09-21: 12.5 g via INTRAVENOUS
  Filled 2019-09-21: qty 250

## 2019-09-21 MED ORDER — HYDROMORPHONE HCL 2 MG PO TABS
8.0000 mg | ORAL_TABLET | Freq: Four times a day (QID) | ORAL | Status: AC
  Administered 2019-09-22 – 2019-09-25 (×12): 8 mg via ORAL
  Filled 2019-09-21 (×11): qty 4

## 2019-09-21 MED ORDER — ALBUMIN HUMAN 5 % IV SOLN
INTRAVENOUS | Status: AC
  Filled 2019-09-21: qty 250

## 2019-09-21 MED ORDER — FENTANYL CITRATE (PF) 100 MCG/2ML IJ SOLN: 50.0000 ug | Freq: Once | INTRAMUSCULAR | Status: AC

## 2019-09-21 MED ORDER — CLONIDINE HCL 0.1 MG PO TABS
0.1000 mg | ORAL_TABLET | Freq: Four times a day (QID) | ORAL | Status: DC
  Administered 2019-09-21 – 2019-09-23 (×9): 0.1 mg via ORAL
  Filled 2019-09-21 (×9): qty 1

## 2019-09-21 MED ORDER — HYDROMORPHONE HCL 2 MG PO TABS: 2.0000 mg | ORAL_TABLET | Freq: Four times a day (QID) | ORAL | Status: DC | PRN

## 2019-09-21 MED ORDER — SODIUM CHLORIDE 0.9% IV SOLUTION: Freq: Once | INTRAVENOUS | Status: AC

## 2019-09-21 MED ORDER — FENTANYL CITRATE (PF) 100 MCG/2ML IJ SOLN
INTRAMUSCULAR | Status: AC
  Administered 2019-09-21: 50 ug via INTRAVENOUS
  Filled 2019-09-21: qty 2

## 2019-09-21 MED ORDER — HYDROMORPHONE HCL 2 MG PO TABS
2.0000 mg | ORAL_TABLET | Freq: Four times a day (QID) | ORAL | Status: DC
  Administered 2019-09-28: 12:00:00 2 mg via ORAL
  Filled 2019-09-21: qty 1

## 2019-09-21 MED ORDER — DIAZEPAM 5 MG PO TABS
10.0000 mg | ORAL_TABLET | Freq: Four times a day (QID) | ORAL | Status: AC
  Administered 2019-09-21 – 2019-09-22 (×5): 10 mg via ORAL
  Filled 2019-09-21 (×5): qty 2

## 2019-09-21 MED ORDER — DEXTROSE-NACL 5-0.45 % IV SOLN: INTRAVENOUS | Status: DC

## 2019-09-21 MED ORDER — POTASSIUM CHLORIDE 20 MEQ/15ML (10%) PO SOLN
40.0000 meq | Freq: Once | ORAL | Status: AC
  Administered 2019-09-21: 40 meq
  Filled 2019-09-21: qty 30

## 2019-09-21 NOTE — Progress Notes (Signed)
ANTICOAGULATION CONSULT NOTE - Follow Up Consult  Pharmacy Consult for heparin Indication: ECMO  Labs: Recent Labs    09/19/19 1608 09/20/19 0324 09/20/19 0325 09/20/19 0340 09/20/19 1656 09/21/19 0016 09/21/19 0439 09/21/19 0447 09/21/19 0555 09/21/19 0555 09/21/19 0728 09/21/19 0802  HGB  --  9.8*  --    < > 9.4*   < > 9.0*   < > 9.2*   < > 10.2* 10.2*  HCT  --  33.1*  --    < > 32.5*   < > 31.1*   < > 27.0*  --  30.0* 30.0*  PLT  --  190  --   --  190  --  172  --   --   --   --   --   APTT  --  67*  --   --  63*  --  84*  --   --   --   --   --   HEPARINUNFRC   < >  --  0.48  --  0.35  --  0.46  --   --   --   --   --   CREATININE  --  1.07  --   --  1.04  --  1.01  --   --   --   --   --    < > = values in this interval not displayed.    Assessment: 44yo male on ECMO heparin drip 1900 units/hr. Heparin level therapeutic at 0.46, Hgb stable 9-10,PLTC stable 170s, LDH stble 300-400, Fibrinogen stable 600  no bleeding noted. Nose bleeding and trach bleeding resolved  Goal of Therapy:  Heparin level 0.3-0.5 units/ml (aiming for lowest end)   Plan:  -Continue heparin drip at 1900 units/hr -Check heparin level q12h -Monitor for s/sx of bleeding  Bonnita Nasuti Pharm.D. CPP, BCPS Clinical Pharmacist [phone] 09/21/2019 9:17 AM    **Pharmacist phone directory can be found on [email or web address] listed under [place]**

## 2019-09-21 NOTE — Progress Notes (Signed)
10 Days Post-Op Procedure(s) (LRB): PEG TUBE INSERTION - BEDSIDE (N/A) ESOPHAGOGASTRODUODENOSCOPY (EGD) (N/A) Subjective: Patient had a stable night Sedation being prepared for daily ECMO sweep wean Maintaining sinus rhythm Afebrile Cannulation sites secured and clean  Objective: Vital signs in last 24 hours: Temp:  [98.6 F (37 C)-100 F (37.8 C)] 99.3 F (37.4 C) (02/13 0800) Pulse Rate:  [70-94] 90 (02/13 1126) Cardiac Rhythm: Normal sinus rhythm (02/13 0800) Resp:  [13-29] 19 (02/13 1126) BP: (136-156)/(52-70) 136/52 (02/13 1126) SpO2:  [94 %-100 %] 96 % (02/13 1126) Arterial Line BP: (91-164)/(43-67) 139/58 (02/13 0915) FiO2 (%):  [40 %-50 %] 40 % (02/13 1126) Weight:  [132.1 kg] 132.1 kg (02/13 0715)  Hemodynamic parameters for last 24 hours: CVP:  [4 mmHg-19 mmHg] 6 mmHg  Intake/Output from previous day: 02/12 0701 - 02/13 0700 In: 4765.7 [I.V.:878.6; ES/LP:5300; IV Piggyback:485.1] Out: 5110 [Urine:5975; Stool:100; Chest Tube:10] Intake/Output this shift: Total I/O In: 556.6 [I.V.:107.4; NG/GT:237; IV Piggyback:212.2] Out: 1070 [Urine:1050; Chest Tube:20]  Exam Breath sounds more pronounced bilaterally Minimal peripheral edema Good peripheral pulses Abdomen soft  Lab Results: Recent Labs    09/20/19 1656 09/21/19 0016 09/21/19 0439 09/21/19 0447 09/21/19 0728 09/21/19 0802  WBC 14.8*  --  12.2*  --   --   --   HGB 9.4*   < > 9.0*   < > 10.2* 10.2*  HCT 32.5*   < > 31.1*   < > 30.0* 30.0*  PLT 190  --  172  --   --   --    < > = values in this interval not displayed.   BMET:  Recent Labs    09/20/19 1656 09/21/19 0016 09/21/19 0439 09/21/19 0447 09/21/19 0728 09/21/19 0802  NA 142   < > 147*   < > 147* 146*  K 4.3   < > 3.6   < > 4.4 4.4  CL 97*  --  99  --   --   --   CO2 34*  --  36*  --   --   --   GLUCOSE 225*  --  116*  --   --   --   BUN 124*  --  134*  --   --   --   CREATININE 1.04  --  1.01  --   --   --   CALCIUM 9.1  --  9.1   --   --   --    < > = values in this interval not displayed.    PT/INR: No results for input(s): LABPROT, INR in the last 72 hours. ABG    Component Value Date/Time   PHART 7.448 09/21/2019 0802   HCO3 40.1 (H) 09/21/2019 0802   TCO2 42 (H) 09/21/2019 0802   ACIDBASEDEF 4.0 (H) 09/16/2019 2334   O2SAT 98.0 09/21/2019 0802   CBG (last 3)  Recent Labs    09/20/19 2326 09/21/19 0304 09/21/19 0725  GLUCAP 219* 135* 165*    Assessment/Plan: S/P Procedure(s) (LRB): PEG TUBE INSERTION - BEDSIDE (N/A) ESOPHAGOGASTRODUODENOSCOPY (EGD) (N/A) Continue ECMO support for COVID-19 pneumonia induced ARDS   LOS: 61 days    Tharon Aquas Trigt III 09/21/2019

## 2019-09-21 NOTE — CV Procedure (Signed)
   ECMO NOTE:  Indication: Respiratory failure due to COVID PNA  Initial cannulation date: 08/03/2019  ECMO type: VV ECMO (Cardio-Help)  Dual lumen  Inflow/return cannula:  1) 32 FR Crescent placed 2/2  ECMO events:  Initial cannulation 12/26 Circuit changed and trach exchanged on 1/8.   Underwent addition of a RIJ drainage cannula on 1/20 Circuit changed on 1/22 for rising LDH Had evidence of recirculation and RIJ cannula pulled back on 1/26 19 FR RIJ removed 09/06/19 Underwent change to [facility] catheter with removal of femoral lines 2/2 Sweep trial 2/11 or 5 mins with O2 sats staying 100% pCO up to 77  Daily data:  Remains on VV ecmo with good flows.    Flow 4.87 RPM 3600 dP 35 Pven  -73 Sweep  11  SVO2 72%  Labs:  Hgb 10.2 LDH 297-> 365 -> 384 -> 431 -> 424 -> 379 Heparin level: 0.46 Lactic acid 0.9  Plan:  Crescent cannula working well Excellent flow and sats CXR stable.  Will continue to wean sedation. Repeat Sweep trial at 11 BUN and bicarb rising c/w contraction alkalosis. Stop lasix gtt. Give albumin Give albumin  Watch DTIs  Glori Bickers, MD  8:34 AM

## 2019-09-21 NOTE — Progress Notes (Addendum)
   Palliative Medicine Inpatient Follow Up Note   HPI: Per intensivist notes -->44 y/o M admitted 12/14 with COVIDpneumonia causing acutehypoxemic respiratory failure. Developed pneumomediastinum 12/23 with concern for possible bilateral small pneumothoraces on 12/24. PCCM consulted for evaluation of pneumothoraces.During admission patient has had placement of bilateral chest tubes, and is now on VV ECMO, and vent by trach.  Palliative medicine consulted for support and GOC  Since last evaluation by palliative team has had conversion to R IJ cresent cannula and PEG tube placed.   Today's Discussion (09/21/2019): Chart reviewed. Checked in with nursing team. Assessed patient, eyes open, not responding. As of present patient is stable, plan for sweep trial today. Patients wife coming in tomorrow to visit.  Will continue to communicate with family incrementally.  Vital Signs Vitals:   09/21/19 0900 09/21/19 0915  BP:    Pulse: 88 88  Resp: 16 16  Temp:    SpO2: 95% 94%    Intake/Output Summary (Last 24 hours) at 09/21/2019 9758 Last data filed at 09/21/2019 0900 Gross per 24 hour  Intake 4859.77 ml  Output 6080 ml  Net -1220.23 ml   Last Weight  Most recent update: 09/21/2019  7:32 AM   Weight  132.1 kg (291 lb 3.6 oz)            Time Spent: 15 Greater than 50% of the time was spent in counseling and coordination of care ______________________________________________________________________________________ [place] Team Team Cell Phone: [phone] Please utilize secure chat with additional questions, if there is no response within 30 minutes please call the above phone number  Palliative Medicine Team providers are available by phone from 7am to 7pm daily and can be reached through the team cell phone.  Should this patient require assistance outside of these hours, please call the patient's attending physician.

## 2019-09-21 NOTE — Progress Notes (Addendum)
Low flow from negative Pven pressures noted, dropped flows to regain flow but reoccurred when rpms turned up to achieve previous flow rate, patient not coughing or breathing abnormally at time, rather resting comfortably; cannula site looks the unchanged from beginning of shift assessment, color change in cannulas is good, other parameters on Cardiohelp monitor as previous, noted slow decrease in Hgb to 7.8 level on Cardiohelp monitor, stat CBC sent to verify; flows left at 4 L with sats maintaining high 90s at this time, will continue to monitor.  Intermittent low flows/negative Pven pressures at 4 lpm flow. 1 Albumin hanging. istat Hgb reading 8.2. ECMO to 3.5 lpm flow.  Hgb verified at 8.1 on CBC, Cardiohelp venous probe recalibrated. Pt maintaining sats mid 90s on 3.5L flow. Looks comfortable.

## 2019-09-21 NOTE — Progress Notes (Signed)
ANTICOAGULATION CONSULT NOTE - Follow Up Consult  Pharmacy Consult for heparin Indication: ECMO  Labs: Recent Labs    09/20/19 1656 09/21/19 0016 09/21/19 0439 09/21/19 0447 09/21/19 1250 09/21/19 1250 09/21/19 1620 09/21/19 1638  HGB 9.4*   < > 9.0*   < > 10.2*   < > 9.9* 8.4*  HCT 32.5*   < > 31.1*   < > 30.0*  --  29.0* 29.5*  PLT 190  --  172  --   --   --   --  169  APTT 63*  --  84*  --   --   --   --  99*  HEPARINUNFRC 0.35  --  0.46  --   --   --   --  0.44  CREATININE 1.04  --  1.01  --   --   --   --  1.08   < > = values in this interval not displayed.    Assessment: 44yo male on ECMO heparin drip 1900 units/hr. Heparin level therapeutic at 0.46, Hgb stable 9-10,PLTC stable 170s, LDH stble 300-400, Fibrinogen stable 600  no bleeding noted. Nose bleeding and trach bleeding resolved.  Repeat heparin level remains therapeutic at 0.44.  Goal of Therapy:  Heparin level 0.3-0.5 units/ml (aiming for lowest end)   Plan:  -Continue heparin drip at 1900 units/hr -Check heparin level q12h -Monitor for s/sx of bleeding   Arrie Senate, PharmD, BCPS Clinical Pharmacist [phone] Please check AMION for all [place] numbers 09/21/2019

## 2019-09-21 NOTE — Progress Notes (Signed)
NAME:  [name], MRN:  [id number], DOB:  10-29-1975, LOS: 69 ADMISSION DATE:  07/22/2019, CONSULTATION DATE:  12/24 REFERRING MD:  Sloan Leiter, CHIEF COMPLAINT:  Dyspnea   Brief History   44 y/o male admitted 12/14 with COVID pneumonia leading to ARDS, pneumomediastinum.  Intubated 12/26, bilateral chest tubes placed.  Past Medical History  GERD HTN Asthma  Significant Hospital Events   12/14 admit with hypoxemic respiratory failure in setting of COVID-19 pneumonia 12/23 noted to have pneumomediastinum on chest x-ray 12/24PCCM consulted for evaluation 12/26 worsening hypoxemia, intubated 12/27 VV fem/fem ECMO cannulation 12/29 tracheostomy >> See bedside nursing event notes for full rundown of procedures after 12/27 1/5 epistaxis, nasal packing 1/30 improved cxr, improved lung compliance, TV 350s on 15PC  1/31 CXR increased infiltrates, +CFB     2/2 Converted to right IJ Crescent cannula. 2/3 PEG tube inserted at bedside  Consults:  PCCM, CTS, CHF, Palliative  Procedures:  PICC 1/3 >> R radial a-line 1/20 >> R IJ ECMO cath 2/3 >> Tracheostomy 12/29 >>  Significant Diagnostic Tests:  CT chest 12/22>>extensive pneumomediastinum, multifocal patchy bilateral groundglass opacities CXR 1/25 >> improving bilateral infiltrates. ECMO cannulae in place. CXR 2/5 >> clearing bilateral infiltrates  Micro Data:  BCx2 12/14 >>negative  Sputum 12/15 >> OPF 12/28 blood > negative 12/29 > staph aureus 1/4 bronch:  1/2 acid fast smear bronch: negative 1/2 fungal cx bronch: negative1/12 BAL: serratia, staph epidermidisu 1/4 BAL > MSSA, Serratia 1/9 blood > negative 1/9 Resp culture > MSSA, serratia 1/11 serratia > negative 1/12 fungus culture > negative 1/12 BAL > serratia 1/18 BAL >> rare Serratia. 1/24 blood > negative 1/30 blood > negative 1/30 resp culture > serratia 2/1 BAL >> rare Serratia - enterococcus 2/1 aspergillus Ag > negative 2/2 resp culture > serratia   2/2 blood > negative 2/5 urine > negative 2/9 resp: serratia  Antimicrobials:  Received remdesivir, steroids, actemra and convalescent plasma  5 days cefepime 12/24-12/29 Cefepime 1/4->1/13 vanc 1/4> 1/11 Ceftriaxone 1/14 > 1/27 vanc x1 1/20 Cefazolin 1/29 > 2/2 cefuroxim 2/2 vanc 2/2 Ceftriaxone x1 2/4 Interim history/subjective:  No acute events overnight Started on reglan yesterday No vomiting, no problems with stool output Weaning sedation  Objective   Blood pressure (!) 143/63, pulse 86, temperature 98.7 F (37.1 C), temperature source Axillary, resp. rate (!) 21, height _0  (1.727 m), weight 132.1 kg, SpO2 98 %. CVP:  [4 mmHg-19 mmHg] 9 mmHg  Vent Mode: PCV FiO2 (%):  [50 %] 50 % Set Rate:  [15 bmp] 15 bmp PEEP:  [16 cmH20] 16 cmH20 Plateau Pressure:  [25 cmH20-30 cmH20] 30 cmH20   Intake/Output Summary (Last 24 hours) at 09/21/2019 0756 Last data filed at 09/21/2019 0745 Gross per 24 hour  Intake 4765.69 ml  Output 6405 ml  Net -1639.31 ml   Filed Weights   09/19/19 0500 09/20/19 0500 09/21/19 0715  Weight: (!) 138 kg (!) 136.1 kg 132.1 kg    Examination:  General:  In bed on vent HENT: NCAT tracheostomy in place, R IJ ECMO cath in place PULM: CTA B, vent supported breathing CV: RRR, no mgr GI: BS+, soft, nontender MSK: normal bulk and tone Neuro: sedated on vent  ECMO: VV, cardiohelp, IJ catheter, flow ~3.9Lpm, SvO2 70%  2/13 CXR images personally reviewed> tracheostomy in place, ecmo catheter in place, bilateral airspace disease improved somewhat compared to the January films.  Some interstitial changes  Resolved Hospital Problem list     Assessment &  Plan:  ARDS due to COVID 19, requiring mechanical ventilation and ECMO support Sweep trail today Wean PEEP some this week no indication for bronchoscopy today Wean sedation, wean sedation Continue pressure control ventilation/lung protective strategy VAP prevention Trach care per  routine  Hypernatremia Continue free water Metamucil  AKI with azotemia agree with holding off on CRRT at this point, but may need to consider that in the next few days  Nutrition, ileus? > no evidence of that Stop reglan Continue bolus feeds 254m q6h  Blood tinged GI secretions > none on 2/13 Monitor  Sedation needs Acute metabolic encephalopathy> medication related, less sedation is a good thing at this point Wean oral dilaudid as you are doing Wean valium as you are doing Agree with weaning precedex and adding clonidine for hypertension management, but fortunately he doesn't look like he is withdrawing/having tolerance to precedex this morning  Hypertension clonidiine propoanolol  Serratia pneumonia> treated Monitor for fever  Neuro muscular weakness Need to think about PT in next week> passive range of motion, etc  Best practice:  Pain/Anxiety/Delirium protocol (if indicated): keep infusions off. Continue enteral agents. Dexmedetomidine and prn Versed and Dilaudid. Begin to taper enteral sedative.  VAP protocol (if indicated): Bundle in place. Respiratory support goals: Pressure control ventilation with ultra protective lung ventilation. Titrate FiO2 down again. Continue PEEP 16  Blood pressure target:  MAP>65, off pressors DVT prophylaxis: Remains on systemic heparin - target Heparin level 0.3-0.7 Nutrition Status: Nutrition Problem: Increased nutrient needs Etiology: acute illness(COVID PNA) Signs/Symptoms: estimated needs Interventions: Tube feeding, Prostat  Bolus feed. GI prophylaxis: Protonix per tube, change to BID today Fluid status goals: cont lasix infusion Urinary catheter: Guide hemodynamic management Glucose control: Euglycemic control with SSI Mobility/therapy needs: Bedrest.  Daily labs: as per ECMO protocol. Code Status: Full code  Family Communication: I attempted to call his wife for an updated on 2/13,  Disposition: ICU  Labs    CBC: Recent Labs  Lab 09/19/19 0336 09/19/19 0350 09/19/19 1608 09/19/19 1614 09/20/19 0324 09/20/19 0340 09/20/19 1656 09/20/19 1656 09/21/19 0016 09/21/19 0439 09/21/19 0447 09/21/19 0555 09/21/19 0728  WBC 10.3  --  14.2*  --  11.9*  --  14.8*  --   --  12.2*  --   --   --   HGB 9.4*   < > 10.1*   < > 9.8*   < > 9.4*   < > 9.5* 9.0* 9.5* 9.2* 10.2*  HCT 33.1*   < > 35.1*   < > 33.1*   < > 32.5*   < > 28.0* 31.1* 28.0* 27.0* 30.0*  MCV 95.7  --  95.4  --  93.5  --  93.9  --   --  94.0  --   --   --   PLT 191  --  212  --  190  --  190  --   --  172  --   --   --    < > = values in this interval not displayed.    Basic Metabolic Panel: Recent Labs  Lab 09/17/19 0445 09/17/19 0454 09/18/19 0334 09/18/19 0629 09/19/19 0336 09/19/19 0350 09/19/19 1608 09/19/19 1614 09/20/19 0324 09/20/19 0340 09/20/19 1656 09/20/19 1656 09/21/19 0016 09/21/19 0439 09/21/19 0447 09/21/19 0555 09/21/19 0728  NA 149*   < > 143   < > 145   < > 143   < > 142   < > 142   < > 145 147*  146* 146* 147*  K 4.1   < > 4.0   < > 3.9   < > 4.4   < > 3.8   < > 4.3   < > 3.7 3.6 3.6 3.7 4.4  CL 111   < > 107   < > 100  --  98  --  97*  --  97*  --   --  99  --   --   --   CO2 27   < > 31   < > 34*  --  34*  --  34*  --  34*  --   --  36*  --   --   --   GLUCOSE 133*   < > 120*   < > 118*  --  184*  --  168*  --  225*  --   --  116*  --   --   --   BUN 105*   < > 102*   < > 114*  --  117*  --  118*  --  124*  --   --  134*  --   --   --   CREATININE 1.13   < > 1.01   < > 1.03  --  1.12  --  1.07  --  1.04  --   --  1.01  --   --   --   CALCIUM 8.9   < > 8.7*   < > 9.1  --  9.3  --  9.1  --  9.1  --   --  9.1  --   --   --   MG 2.0  --  1.8  --  2.1  --   --   --  2.2  --   --   --   --  2.4  --   --   --   PHOS 4.7*  --  4.4  --  4.2  --   --   --  4.1  --   --   --   --  3.9  --   --   --    < > = values in this interval not displayed.   GFR: Estimated Creatinine Clearance: 122.7 mL/min (by  C-G formula based on SCr of 1.01 mg/dL). Recent Labs  Lab 09/18/19 0334 09/18/19 1619 09/19/19 0336 09/19/19 0336 09/19/19 1608 09/20/19 0324 09/20/19 1656 09/21/19 0439  WBC 10.5   < > 10.3   < > 14.2* 11.9* 14.8* 12.2*  LATICACIDVEN 1.1  --  1.3  --   --  1.3  --  0.9   < > = values in this interval not displayed.    Liver Function Tests: No results for input(s): AST, ALT, ALKPHOS, BILITOT, PROT, ALBUMIN in the last 168 hours. No results for input(s): LIPASE, AMYLASE in the last 168 hours. No results for input(s): AMMONIA in the last 168 hours.  ABG    Component Value Date/Time   PHART 7.431 09/21/2019 0728   PCO2ART 60.8 (H) 09/21/2019 0728   PO2ART 104.0 09/21/2019 0728   HCO3 40.5 (H) 09/21/2019 0728   TCO2 42 (H) 09/21/2019 0728   ACIDBASEDEF 4.0 (H) 09/16/2019 2334   O2SAT 98.0 09/21/2019 0728     Coagulation Profile: No results for input(s): INR, PROTIME in the last 168 hours.  Cardiac Enzymes: No results for input(s): CKTOTAL, CKMB, CKMBINDEX, TROPONINI in the last 168 hours.  HbA1C: Hgb A1c MFr  Bld  Date/Time Value Ref Range Status  07/24/2019 04:50 AM 6.7 (H) 4.8 - 5.6 % Final    Comment:    (NOTE) Pre diabetes:          5.7%-6.4% Diabetes:              >6.4% Glycemic control for   <7.0% adults with diabetes   09/30/2017 05:36 AM 5.7 (H) 4.8 - 5.6 % Final    Comment:    (NOTE)         Prediabetes: 5.7 - 6.4         Diabetes: >6.4         Glycemic control for adults with diabetes: <7.0     CBG: Recent Labs  Lab 09/20/19 1118 09/20/19 1647 09/20/19 1947 09/20/19 2326 09/21/19 0304  GLUCAP 166* 210* 215* 219* 135*     Critical care time: 40 minutes     Roselie Awkward, MD [facility] Pager: [phone] Cell: [phone] If no response, call [phone]

## 2019-09-21 NOTE — Progress Notes (Signed)
At 12:42 a Sweep trial was preformed. Ventilator was optimized and sweep gas was turned off. Patient was monitored for 8:05 mins. An ABG was drawn, sweep gas was returned to 10 and ventilator was returned to prior settings.

## 2019-09-21 NOTE — Progress Notes (Signed)
Patient ID: [name], male   DOB: 1976/05/10, 44 y.o.   MRN: [id number]    Advanced Heart Failure Rounding Note   Subjective:    Remains on VV ecmo with good flows.   Sedated on Precedex and po dilaudid/valium will arouse but not reliably follow commands  Remains on lasix gtt at 5 and diamox. Weight down another 9 pounds. (30 pounds in 1 week),   CVP 6  Sweep 11  On vent at 50% TVs > 500cc  CXR with diffuse bilateral infiltrates stable today. Crescent catheter well positioned  Personally reviewed  ABG 7.45/57/106/90 bicarb 40  hgb 10.2  Sodium 146. BUN 118-> 134 Cr 1.01  ECMO parameters - see separate ecmo note   Objective:   Weight Range:  Vital Signs:   Temp:  [98.6 F (37 C)-100 F (37.8 C)] 98.7 F (37.1 C) (02/13 0415) Pulse Rate:  [70-94] 89 (02/13 0822) Resp:  [13-29] 18 (02/13 0822) BP: (143-156)/(63-70) 143/63 (02/13 0709) SpO2:  [97 %-100 %] 99 % (02/13 0822) Arterial Line BP: (91-164)/(43-67) 161/58 (02/13 0815) FiO2 (%):  [50 %] 50 % (02/13 0709) Weight:  [132.1 kg] 132.1 kg (02/13 0715) Last BM Date: 09/21/19  Weight change: Filed Weights   09/19/19 0500 09/20/19 0500 09/21/19 0715  Weight: (!) 138 kg (!) 136.1 kg 132.1 kg    Intake/Output:   Intake/Output Summary (Last 24 hours) at 09/21/2019 0855 Last data filed at 09/21/2019 0800 Gross per 24 hour  Intake 4753.77 ml  Output 6055 ml  Net -1301.23 ml     Physical Exam: General: Sedated on vent Will arouse to voice HEENT: normal Neck: supple. RIJ cannula ok. Carotids 2+ bilat; no bruits. No lymphadenopathy or thryomegaly appreciated. Cor: PMI nondisplaced. Regular rate & rhythm. No rubs, gallops or murmurs. Lungs: improved air movement Abdomen: obese soft, nontender, nondistended. No hepatosplenomegaly. No bruits or masses. Good bowel sounds. Extremities: no cyanosis, clubbing, rash,1+  Edema + DTIs Neuro: Sedated on vent Will arouse to voice   Telemetry: Sinus 80-90s   Personally reviewed  Labs: Basic Metabolic Panel: Recent Labs  Lab 09/17/19 0445 09/17/19 0454 09/18/19 0334 09/18/19 0629 09/19/19 0336 09/19/19 0350 09/19/19 1608 09/19/19 1614 09/20/19 0324 09/20/19 0340 09/20/19 1656 09/21/19 0016 09/21/19 0439 09/21/19 0447 09/21/19 0555 09/21/19 0728 09/21/19 0802  NA 149*   < > 143   < > 145   < > 143   < > 142   < > 142   < > 147* 146* 146* 147* 146*  K 4.1   < > 4.0   < > 3.9   < > 4.4   < > 3.8   < > 4.3   < > 3.6 3.6 3.7 4.4 4.4  CL 111   < > 107   < > 100  --  98  --  97*  --  97*  --  99  --   --   --   --   CO2 27   < > 31   < > 34*  --  34*  --  34*  --  34*  --  36*  --   --   --   --   GLUCOSE 133*   < > 120*   < > 118*  --  184*  --  168*  --  225*  --  116*  --   --   --   --   BUN 105*   < >  102*   < > 114*  --  117*  --  118*  --  124*  --  134*  --   --   --   --   CREATININE 1.13   < > 1.01   < > 1.03  --  1.12  --  1.07  --  1.04  --  1.01  --   --   --   --   CALCIUM 8.9   < > 8.7*   < > 9.1   < > 9.3   < > 9.1  --  9.1  --  9.1  --   --   --   --   MG 2.0  --  1.8  --  2.1  --   --   --  2.2  --   --   --  2.4  --   --   --   --   PHOS 4.7*  --  4.4  --  4.2  --   --   --  4.1  --   --   --  3.9  --   --   --   --    < > = values in this interval not displayed.    Liver Function Tests: No results for input(s): AST, ALT, ALKPHOS, BILITOT, PROT, ALBUMIN in the last 168 hours. No results for input(s): LIPASE, AMYLASE in the last 168 hours. No results for input(s): AMMONIA in the last 168 hours.  CBC: Recent Labs  Lab 09/19/19 0336 09/19/19 0350 09/19/19 1608 09/19/19 1614 09/20/19 0324 09/20/19 0340 09/20/19 1656 09/21/19 0016 09/21/19 0439 09/21/19 0447 09/21/19 0555 09/21/19 0728 09/21/19 0802  WBC 10.3  --  14.2*  --  11.9*  --  14.8*  --  12.2*  --   --   --   --   HGB 9.4*   < > 10.1*   < > 9.8*   < > 9.4*   < > 9.0* 9.5* 9.2* 10.2* 10.2*  HCT 33.1*   < > 35.1*   < > 33.1*   < > 32.5*   < > 31.1*  28.0* 27.0* 30.0* 30.0*  MCV 95.7  --  95.4  --  93.5  --  93.9  --  94.0  --   --   --   --   PLT 191  --  212  --  190  --  190  --  172  --   --   --   --    < > = values in this interval not displayed.    Cardiac Enzymes: No results for input(s): CKTOTAL, CKMB, CKMBINDEX, TROPONINI in the last 168 hours.  BNP: BNP (last 3 results) Recent Labs    07/22/19 1039  BNP 15.3    ProBNP (last 3 results) No results for input(s): PROBNP in the last 8760 hours.    Other results:  Imaging: DG CHEST PORT 1 VIEW  Result Date: 09/20/2019 CLINICAL DATA:  Check tracheostomy tube placement EXAM: PORTABLE CHEST 1 VIEW COMPARISON:  09/19/2019 FINDINGS: Cardiac shadow remains enlarged. ECMO cannula is again identified and stable. Tracheostomy tube and bilateral thoracostomy catheters are again noted and stable. The overall inspiratory effort is poor. No pneumothorax is seen. Diffuse patchy infiltrates are noted bilaterally relatively stable from the prior exam. No new focal abnormality is noted. IMPRESSION: Tubes and lines as described. Bilateral opacities relatively stable from the prior study. Electronically Signed   By:  Inez Catalina M.D.   On: 09/20/2019 08:36     Medications:     Scheduled Medications: . acetaZOLAMIDE  500 mg Per Tube BID  . amiodarone  200 mg Oral Daily  . vitamin C  500 mg Per Tube Daily  . chlorhexidine gluconate (MEDLINE KIT)  15 mL Mouth Rinse BID  . Chlorhexidine Gluconate Cloth  6 each Topical Daily  . cloNIDine  0.1 mg Oral Q6H  . diazepam  10 mg Oral Q6H   Followed by  . [START ON 09/22/2019] diazepam  10 mg Oral Q6H  . feeding supplement (OSMOLITE 1.5 CAL)  237 mL Per Tube 6 X Daily  . feeding supplement (PRO-STAT SUGAR FREE 64)  60 mL Per Tube QID  . free water  200 mL Per Tube Q6H  . gabapentin  200 mg Per Tube Q8H  . HYDROmorphone  9 mg Oral Q6H   Followed by  . [START ON 09/22/2019] HYDROmorphone  8 mg Oral Q6H   Followed by  . [START ON 09/25/2019]  HYDROmorphone  4 mg Oral Q6H   Followed by  . [START ON 09/28/2019] HYDROmorphone  2 mg Oral Q6H  . insulin aspart  0-20 Units Subcutaneous Q4H  . ipratropium-albuterol  3 mL Nebulization Q4H  . mouth rinse  15 mL Mouth Rinse 10 times per day  . metoCLOPramide (REGLAN) injection  10 mg Intravenous Q6H  . montelukast  10 mg Per Tube QHS  . nutrition supplement (JUVEN)  1 packet Per Tube BID BM  . pantoprazole sodium  40 mg Per Tube BID  . propranolol  40 mg Per Tube TID  . psyllium  1 packet Oral BID  . QUEtiapine  50 mg Per Tube BID  . sodium chloride flush  10-40 mL Intracatheter Q12H  . white petrolatum   Topical BID    Infusions: . sodium chloride    . sodium chloride Stopped (09/05/19 1900)  . sodium chloride 1,000 mL (09/20/19 2312)  . dexmedetomidine (PRECEDEX) IV infusion 0.2 mcg/kg/hr (09/21/19 0800)  . heparin 1,900 Units/hr (09/21/19 0800)    PRN Medications: Place/Maintain arterial line **AND** sodium chloride, sodium chloride, sodium chloride, acetaminophen (TYLENOL) oral liquid 160 mg/5 mL, acetaminophen, hydrALAZINE, HYDROmorphone **FOLLOWED BY** [START ON 09/22/2019] HYDROmorphone **FOLLOWED BY** [START ON 09/25/2019] HYDROmorphone **FOLLOWED BY** [START ON 09/28/2019] HYDROmorphone **FOLLOWED BY** [START ON 10/01/2019] HYDROmorphone, magic mouthwash, magnesium hydroxide, ondansetron (ZOFRAN) IV, senna-docusate, sodium chloride   Assessment/Plan:    1. Acute hypoxic/hypercarbic respiratory failure due to COVID PNA: Remained markedly hypoxic despite full support. Intubated 12/26. S/p bilateral CTs 12/26 for pneumothoraces.  TEE on 12/26 LVEF 70% RV ok. VV ECMO begun 12/26. Tracheostomy 12/29.  COVID ARDS at this point. ECMO circuit changed 1/8 & 1/23. Crescent cannula placed 2/2 (femoral cannuals removed) - ECMO parameters as per ECMO note - Flows look  good on Crescent. Diuresed aggressively this week. CXR stable. Sweep 11.  - Volume status much improved. BUN continues  to climb. Stop lasix for now. Continue Diamox. Give albumin.  - LDH 289-> 365 -> 384  -> 431 -> 424 -> 379 - Sweep trial today much improved from previous. Will continue to try to wean. Daily sweep trials with vent optimzation - Continue heparin. No bleeding. Discussed dosing with PharmD personally. - Continue chest PT - Continue regular episodes of awakening and letting him work interspersed with sedation and rest periods.  2. Bilateral PTX: Due to barotrauma.  - Resolved  3. COVID PNA: management of COVID  infection per CCM. CXR with bilateral multifocal PNA progressing to likely ARDS.  - s/p 10 days of remdesivir - 12/16, 12/2 s/p tociluzimab - 12/17 s/p convalescent plasma - Decadron at 75m/daily completed 1/11 - Completed cefepime/vanc - Off Ancef  4. ID: Empiric coverage with vancomycin/cefepime initially for possible secondary bacterial PNA, course completed.   - treated with cefepime and vanc for + cx with staph epidimidis, MSSA and serratia (sensitivities ok). - Currently off abx - BAL cx. With serratia and enterococcus felt to be colonization - No fevere WBC stable  - Continue to follow closely  5. Anemia: - hgb 10.5 oday. Transfuse as needed to keep Hgb> 8.0  7. FEN - now with G-tube.Continue TFs  8. Hypernatremia - NA 155 -> 143 -> 145 -> 142 -> 146 - Continue FW at current rate. Hopefully can decrease slightly soon   9. AKI - Volume status much improved with lasix gtt. BUN continues to climb. Stop lasix for now. Continue Diamox. Give albumin.  -  Possibility of CVVHD discussed on rounds also d/w Renal. No current indication for HD. D/w Renal. Would consider CVVHD once sedation weaned and felt to be uremic - will give some albumin to try to reduce azotemia   10. DTIs - WOC following.   D/w with ECMO team (ECMO coordinator/specialists, CCM, TCTS and pharmD) at bedside on multidisciplinary rounds. Continue to await ARDS recovery. Details as above.   Length of  Stay: 636 CRITICAL CARE Performed by: BGlori Bickers Total critical care time: 40  minutes  Critical care time was exclusive of separately billable procedures and treating other patients.  Critical care was necessary to treat or prevent imminent or life-threatening deterioration.  Critical care was time spent personally by me (independent of midlevel providers or residents) on the following activities: development of treatment plan with patient and/or surrogate as well as nursing, discussions with consultants, evaluation of patient's response to treatment, examination of patient, obtaining history from patient or surrogate, ordering and performing treatments and interventions, ordering and review of laboratory studies, ordering and review of radiographic studies, pulse oximetry and re-evaluation of patient's condition.    DGlori BickersMD 09/21/2019, 8:55 AM  Advanced Heart Failure Team Pager [phone](M-F; 7a - 4p)  Please contact [facility] for night-coverage after hours (4p -7a ) and weekends on [email or web address]

## 2019-09-22 ENCOUNTER — Inpatient Hospital Stay (HOSPITAL_COMMUNITY): Payer: Managed Care, Other (non HMO)

## 2019-09-22 LAB — GLUCOSE, CAPILLARY
Glucose-Capillary: 241 mg/dL — ABNORMAL HIGH (ref 70–99)
Glucose-Capillary: 176 mg/dL — ABNORMAL HIGH (ref 70–99)
Glucose-Capillary: 182 mg/dL — ABNORMAL HIGH (ref 70–99)
Glucose-Capillary: 258 mg/dL — ABNORMAL HIGH (ref 70–99)
Glucose-Capillary: 303 mg/dL — ABNORMAL HIGH (ref 70–99)
Glucose-Capillary: 303 mg/dL — ABNORMAL HIGH (ref 70–99)
Glucose-Capillary: 349 mg/dL — ABNORMAL HIGH (ref 70–99)

## 2019-09-22 LAB — POCT I-STAT 7, (LYTES, BLD GAS, ICA,H+H)
Acid-Base Excess: 10 mmol/L — ABNORMAL HIGH (ref 0.0–2.0)
Acid-Base Excess: 6 mmol/L — ABNORMAL HIGH (ref 0.0–2.0)
Acid-Base Excess: 6 mmol/L — ABNORMAL HIGH (ref 0.0–2.0)
Acid-Base Excess: 6 mmol/L — ABNORMAL HIGH (ref 0.0–2.0)
Acid-Base Excess: 7 mmol/L — ABNORMAL HIGH (ref 0.0–2.0)
Acid-Base Excess: 7 mmol/L — ABNORMAL HIGH (ref 0.0–2.0)
Acid-Base Excess: 7 mmol/L — ABNORMAL HIGH (ref 0.0–2.0)
Acid-Base Excess: 7 mmol/L — ABNORMAL HIGH (ref 0.0–2.0)
Acid-Base Excess: 8 mmol/L — ABNORMAL HIGH (ref 0.0–2.0)
Bicarbonate: 30.7 mmol/L — ABNORMAL HIGH (ref 20.0–28.0)
Bicarbonate: 32.3 mmol/L — ABNORMAL HIGH (ref 20.0–28.0)
Bicarbonate: 33.4 mmol/L — ABNORMAL HIGH (ref 20.0–28.0)
Bicarbonate: 33.5 mmol/L — ABNORMAL HIGH (ref 20.0–28.0)
Bicarbonate: 33.9 mmol/L — ABNORMAL HIGH (ref 20.0–28.0)
Bicarbonate: 35.3 mmol/L — ABNORMAL HIGH (ref 20.0–28.0)
Bicarbonate: 35.5 mmol/L — ABNORMAL HIGH (ref 20.0–28.0)
Bicarbonate: 35.9 mmol/L — ABNORMAL HIGH (ref 20.0–28.0)
Bicarbonate: 36.1 mmol/L — ABNORMAL HIGH (ref 20.0–28.0)
Calcium, Ion: 1.27 mmol/L (ref 1.15–1.40)
Calcium, Ion: 1.27 mmol/L (ref 1.15–1.40)
Calcium, Ion: 1.28 mmol/L (ref 1.15–1.40)
Calcium, Ion: 1.28 mmol/L (ref 1.15–1.40)
Calcium, Ion: 1.28 mmol/L (ref 1.15–1.40)
Calcium, Ion: 1.29 mmol/L (ref 1.15–1.40)
Calcium, Ion: 1.32 mmol/L (ref 1.15–1.40)
Calcium, Ion: 1.32 mmol/L (ref 1.15–1.40)
Calcium, Ion: 1.33 mmol/L (ref 1.15–1.40)
HCT: 24 % — ABNORMAL LOW (ref 39.0–52.0)
HCT: 25 % — ABNORMAL LOW (ref 39.0–52.0)
HCT: 28 % — ABNORMAL LOW (ref 39.0–52.0)
HCT: 28 % — ABNORMAL LOW (ref 39.0–52.0)
HCT: 29 % — ABNORMAL LOW (ref 39.0–52.0)
HCT: 30 % — ABNORMAL LOW (ref 39.0–52.0)
HCT: 30 % — ABNORMAL LOW (ref 39.0–52.0)
HCT: 30 % — ABNORMAL LOW (ref 39.0–52.0)
HCT: 30 % — ABNORMAL LOW (ref 39.0–52.0)
Hemoglobin: 10.2 g/dL — ABNORMAL LOW (ref 13.0–17.0)
Hemoglobin: 10.2 g/dL — ABNORMAL LOW (ref 13.0–17.0)
Hemoglobin: 10.2 g/dL — ABNORMAL LOW (ref 13.0–17.0)
Hemoglobin: 10.2 g/dL — ABNORMAL LOW (ref 13.0–17.0)
Hemoglobin: 8.2 g/dL — ABNORMAL LOW (ref 13.0–17.0)
Hemoglobin: 8.5 g/dL — ABNORMAL LOW (ref 13.0–17.0)
Hemoglobin: 9.5 g/dL — ABNORMAL LOW (ref 13.0–17.0)
Hemoglobin: 9.5 g/dL — ABNORMAL LOW (ref 13.0–17.0)
Hemoglobin: 9.9 g/dL — ABNORMAL LOW (ref 13.0–17.0)
O2 Saturation: 93 %
O2 Saturation: 96 %
O2 Saturation: 96 %
O2 Saturation: 96 %
O2 Saturation: 96 %
O2 Saturation: 96 %
O2 Saturation: 96 %
O2 Saturation: 97 %
O2 Saturation: 98 %
Patient temperature: 36.8
Patient temperature: 36.9
Patient temperature: 36.9
Patient temperature: 36.9
Patient temperature: 36.9
Patient temperature: 36.9
Patient temperature: 37
Patient temperature: 38.6
Patient temperature: 98.6
Potassium: 3.8 mmol/L (ref 3.5–5.1)
Potassium: 4.5 mmol/L (ref 3.5–5.1)
Potassium: 4.5 mmol/L (ref 3.5–5.1)
Potassium: 4.6 mmol/L (ref 3.5–5.1)
Potassium: 4.6 mmol/L (ref 3.5–5.1)
Potassium: 4.8 mmol/L (ref 3.5–5.1)
Potassium: 4.8 mmol/L (ref 3.5–5.1)
Potassium: 4.9 mmol/L (ref 3.5–5.1)
Potassium: 5 mmol/L (ref 3.5–5.1)
Sodium: 150 mmol/L — ABNORMAL HIGH (ref 135–145)
Sodium: 150 mmol/L — ABNORMAL HIGH (ref 135–145)
Sodium: 150 mmol/L — ABNORMAL HIGH (ref 135–145)
Sodium: 150 mmol/L — ABNORMAL HIGH (ref 135–145)
Sodium: 150 mmol/L — ABNORMAL HIGH (ref 135–145)
Sodium: 151 mmol/L — ABNORMAL HIGH (ref 135–145)
Sodium: 151 mmol/L — ABNORMAL HIGH (ref 135–145)
Sodium: 151 mmol/L — ABNORMAL HIGH (ref 135–145)
Sodium: 152 mmol/L — ABNORMAL HIGH (ref 135–145)
TCO2: 32 mmol/L (ref 22–32)
TCO2: 34 mmol/L — ABNORMAL HIGH (ref 22–32)
TCO2: 35 mmol/L — ABNORMAL HIGH (ref 22–32)
TCO2: 35 mmol/L — ABNORMAL HIGH (ref 22–32)
TCO2: 35 mmol/L — ABNORMAL HIGH (ref 22–32)
TCO2: 38 mmol/L — ABNORMAL HIGH (ref 22–32)
TCO2: 38 mmol/L — ABNORMAL HIGH (ref 22–32)
TCO2: 38 mmol/L — ABNORMAL HIGH (ref 22–32)
TCO2: 39 mmol/L — ABNORMAL HIGH (ref 22–32)
pCO2 arterial: 50.9 mmHg — ABNORMAL HIGH (ref 32.0–48.0)
pCO2 arterial: 50.9 mmHg — ABNORMAL HIGH (ref 32.0–48.0)
pCO2 arterial: 52.1 mmHg — ABNORMAL HIGH (ref 32.0–48.0)
pCO2 arterial: 54 mmHg — ABNORMAL HIGH (ref 32.0–48.0)
pCO2 arterial: 54.8 mmHg — ABNORMAL HIGH (ref 32.0–48.0)
pCO2 arterial: 56 mmHg — ABNORMAL HIGH (ref 32.0–48.0)
pCO2 arterial: 75.1 mmHg (ref 32.0–48.0)
pCO2 arterial: 81.7 mmHg (ref 32.0–48.0)
pCO2 arterial: 87.5 mmHg (ref 32.0–48.0)
pH, Arterial: 7.222 — ABNORMAL LOW (ref 7.350–7.450)
pH, Arterial: 7.243 — ABNORMAL LOW (ref 7.350–7.450)
pH, Arterial: 7.283 — ABNORMAL LOW (ref 7.350–7.450)
pH, Arterial: 7.384 (ref 7.350–7.450)
pH, Arterial: 7.392 (ref 7.350–7.450)
pH, Arterial: 7.395 (ref 7.350–7.450)
pH, Arterial: 7.41 (ref 7.350–7.450)
pH, Arterial: 7.42 (ref 7.350–7.450)
pH, Arterial: 7.431 (ref 7.350–7.450)
pO2, Arterial: 105 mmHg (ref 83.0–108.0)
pO2, Arterial: 110 mmHg — ABNORMAL HIGH (ref 83.0–108.0)
pO2, Arterial: 118 mmHg — ABNORMAL HIGH (ref 83.0–108.0)
pO2, Arterial: 68 mmHg — ABNORMAL LOW (ref 83.0–108.0)
pO2, Arterial: 80 mmHg — ABNORMAL LOW (ref 83.0–108.0)
pO2, Arterial: 80 mmHg — ABNORMAL LOW (ref 83.0–108.0)
pO2, Arterial: 84 mmHg (ref 83.0–108.0)
pO2, Arterial: 84 mmHg (ref 83.0–108.0)
pO2, Arterial: 93 mmHg (ref 83.0–108.0)

## 2019-09-22 LAB — CBC
HCT: 27.5 % — ABNORMAL LOW (ref 39.0–52.0)
HCT: 29.7 % — ABNORMAL LOW (ref 39.0–52.0)
Hemoglobin: 8 g/dL — ABNORMAL LOW (ref 13.0–17.0)
Hemoglobin: 8.6 g/dL — ABNORMAL LOW (ref 13.0–17.0)
MCH: 27.8 pg (ref 26.0–34.0)
MCH: 27.7 pg (ref 26.0–34.0)
MCHC: 29.1 g/dL — ABNORMAL LOW (ref 30.0–36.0)
MCHC: 29 g/dL — ABNORMAL LOW (ref 30.0–36.0)
MCV: 95.5 fL (ref 80.0–100.0)
MCV: 95.5 fL (ref 80.0–100.0)
Platelets: 144 10*3/uL — ABNORMAL LOW (ref 150–400)
Platelets: 154 10*3/uL (ref 150–400)
RBC: 2.88 MIL/uL — ABNORMAL LOW (ref 4.22–5.81)
RBC: 3.11 MIL/uL — ABNORMAL LOW (ref 4.22–5.81)
RDW: 17.3 % — ABNORMAL HIGH (ref 11.5–15.5)
RDW: 17.9 % — ABNORMAL HIGH (ref 11.5–15.5)
WBC: 9.4 10*3/uL (ref 4.0–10.5)
WBC: 13.9 10*3/uL — ABNORMAL HIGH (ref 4.0–10.5)
nRBC: 0 % (ref 0.0–0.2)
nRBC: 0 % (ref 0.0–0.2)

## 2019-09-22 LAB — BASIC METABOLIC PANEL
Anion gap: 11 (ref 5–15)
Anion gap: 9 (ref 5–15)
BUN: 138 mg/dL — ABNORMAL HIGH (ref 6–20)
BUN: 139 mg/dL — ABNORMAL HIGH (ref 6–20)
CO2: 32 mmol/L (ref 22–32)
CO2: 30 mmol/L (ref 22–32)
Calcium: 9 mg/dL (ref 8.9–10.3)
Calcium: 9 mg/dL (ref 8.9–10.3)
Chloride: 105 mmol/L (ref 98–111)
Chloride: 109 mmol/L (ref 98–111)
Creatinine, Ser: 1 mg/dL (ref 0.61–1.24)
Creatinine, Ser: 1.01 mg/dL (ref 0.61–1.24)
GFR calc Af Amer: >60 mL/min (ref >60)
GFR calc Af Amer: >60 mL/min (ref >60)
GFR calc non Af Amer: >60 mL/min (ref >60)
GFR calc non Af Amer: >60 mL/min (ref >60)
Glucose, Bld: 181 mg/dL — ABNORMAL HIGH (ref 70–99)
Glucose, Bld: 349 mg/dL — ABNORMAL HIGH (ref 70–99)
Potassium: 3.8 mmol/L (ref 3.5–5.1)
Potassium: 5 mmol/L (ref 3.5–5.1)
Sodium: 148 mmol/L — ABNORMAL HIGH (ref 135–145)
Sodium: 148 mmol/L — ABNORMAL HIGH (ref 135–145)

## 2019-09-22 LAB — APTT
aPTT: 94 seconds — ABNORMAL HIGH (ref 24–36)
aPTT: 65 seconds — ABNORMAL HIGH (ref 24–36)

## 2019-09-22 LAB — HEPARIN LEVEL (UNFRACTIONATED)
Heparin Unfractionated: 0.5 IU/mL (ref 0.30–0.70)
Heparin Unfractionated: 0.39 IU/mL (ref 0.30–0.70)

## 2019-09-22 LAB — COOXEMETRY PANEL
Carboxyhemoglobin: 2.2 % — ABNORMAL HIGH (ref 0.5–1.5)
Methemoglobin: 0.6 % (ref 0.0–1.5)
O2 Saturation: 78.5 %
Total hemoglobin: 14.8 g/dL (ref 12.0–16.0)

## 2019-09-22 LAB — FIBRINOGEN: Fibrinogen: 525 mg/dL — ABNORMAL HIGH (ref 210–475)

## 2019-09-22 LAB — LACTATE DEHYDROGENASE: LDH: 307 U/L — ABNORMAL HIGH (ref 98–192)

## 2019-09-22 LAB — PREPARE RBC (CROSSMATCH)

## 2019-09-22 LAB — LACTIC ACID, PLASMA: Lactic Acid, Venous: 0.8 mmol/L (ref 0.5–1.9)

## 2019-09-22 LAB — MAGNESIUM: Magnesium: 2.5 mg/dL — ABNORMAL HIGH (ref 1.7–2.4)

## 2019-09-22 LAB — PHOSPHORUS: Phosphorus: 3.7 mg/dL (ref 2.5–4.6)

## 2019-09-22 MED ORDER — SODIUM CHLORIDE 0.9% FLUSH: 10.0000 mL | INTRAVENOUS | Status: DC | PRN

## 2019-09-22 MED ORDER — SODIUM CHLORIDE 0.9% FLUSH
10.0000 mL | Freq: Two times a day (BID) | INTRAVENOUS | Status: DC
  Administered 2019-09-22: 20 mL
  Administered 2019-09-22 – 2019-10-04 (×14): 10 mL
  Administered 2019-10-07 – 2019-10-10 (×3): 20 mL
  Administered 2019-10-10 – 2019-10-17 (×5): 10 mL
  Administered 2019-10-18: 20 mL
  Administered 2019-10-18 – 2019-10-28 (×18): 10 mL
  Administered 2019-10-28: 20 mL
  Administered 2019-10-29 – 2019-11-12 (×29): 10 mL
  Administered 2019-11-13: 20 mL
  Administered 2019-11-13 – 2019-11-14 (×2): 10 mL

## 2019-09-22 MED ORDER — FREE WATER
400.0000 mL | Freq: Four times a day (QID) | Status: DC
  Administered 2019-09-22 – 2019-09-28 (×24): 400 mL

## 2019-09-22 MED ORDER — POTASSIUM CHLORIDE 20 MEQ/15ML (10%) PO SOLN
40.0000 meq | Freq: Once | ORAL | Status: AC
  Administered 2019-09-22: 40 meq
  Filled 2019-09-22: qty 30

## 2019-09-22 NOTE — Progress Notes (Signed)
ANTICOAGULATION CONSULT NOTE - Follow Up Consult  Pharmacy Consult for heparin Indication: ECMO  Labs: Recent Labs    09/21/19 1638 09/21/19 1638 09/21/19 2018 09/21/19 2020 09/22/19 0424 09/22/19 0433 09/22/19 1412 09/22/19 1412 09/22/19 1557 09/22/19 1558  HGB 8.4*   < > 8.1*   < > 8.0*   < > 9.9*   < > 8.6* 9.5*  HCT 29.5*   < > 28.1*   < > 27.5*   < > 29.0*  --  29.7* 28.0*  PLT 169   < > 160  --  144*  --   --   --  154  --   APTT 99*  --   --   --  94*  --   --   --  65*  --   HEPARINUNFRC 0.44  --   --   --  0.50  --   --   --  0.39  --   CREATININE 1.08  --   --   --  1.00  --   --   --  1.01  --    < > = values in this interval not displayed.    Assessment: 44yo male on ECMO heparin drip 1900 units/hr,  Heparin level rising to top of therapeutic range this am at 0.5, Hgb fell overnight - receiving PRBC,PLTC stable 170s, LDH stble 300-400, Fibrinogen stable 600  no bleeding noted, orange tinged urine Nose bleeding and trach bleeding resolved.  Repeat heparin level down to 0.39 - no changes.   Goal of Therapy:  Heparin level 0.3-0.5 units/ml (aiming for lowest end)   Plan:  -Continue heparin 1700 units/h -Coags in am  Arrie Senate, PharmD, BCPS Clinical Pharmacist [phone] Please check AMION for all [place] numbers 09/22/2019

## 2019-09-22 NOTE — Progress Notes (Signed)
Patient ID: [name], male   DOB: 03/05/1976, 44 y.o.   MRN: [id number]    Advanced Heart Failure Rounding Note   Subjective:    Remains on VV ecmo. Had low flow on venous line last night but improved.   Sedated on Precedex and po dilaudid/valium will arouse but not reliably follow commands. We are weaning sedation   Lasix stopped and given a,bumin and IVF due to rising BUN.  Sweep trial yesterday for 51mns and did well   CVP 6  Sweep 9   On vent at 40% TVs > 400-500cc  CXR with diffuse bilateral infiltrates (?) slightly worse today. Crescent catheter well positioned  Personally reviewed  ABG 7.43/54/84/96   hgb 10.2 -> 8.2  Sodium 146. BUN 118-> 134 -> 138 Cr 1.00  ECMO parameters - see separate ecmo note   Objective:   Weight Range:  Vital Signs:   Temp:  [97.9 F (36.6 C)-99.3 F (37.4 C)] 98.4 F (36.9 C) (02/14 0815) Pulse Rate:  [80-95] 87 (02/14 0815) Resp:  [16-37] 19 (02/14 0815) BP: (136-142)/(52-58) 142/58 (02/13 1525) SpO2:  [92 %-100 %] 96 % (02/14 0815) Arterial Line BP: (80-166)/(39-65) 135/55 (02/14 0815) FiO2 (%):  [40 %] 40 % (02/14 0453) Weight:  [131 kg] 131 kg (02/14 0645) Last BM Date: 09/22/19  Weight change: Filed Weights   09/20/19 0500 09/21/19 0715 09/22/19 0645  Weight: (!) 136.1 kg 132.1 kg 131 kg    Intake/Output:   Intake/Output Summary (Last 24 hours) at 09/22/2019 0833 Last data filed at 09/22/2019 0800 Gross per 24 hour  Intake 4316.77 ml  Output 5135 ml  Net -818.23 ml     Physical Exam: General: Sedated on vent Will arouse to voice HEENT: normal Neck: supple. RIJ cannula Carotids 2+ bilat; no bruits. No lymphadenopathy or thryomegaly appreciated. Cor: PMI nondisplaced. Regular rate & rhythm. No rubs, gallops or murmurs. Lungs: clear Abdomen: obese soft, nontender, nondistended. No hepatosplenomegaly. No bruits or masses. Good bowel sounds. Extremities: no cyanosis, clubbing, rash, 1-2+ edema Neuro:  intubated sedated   Telemetry: Sinus 80-90s  Personally reviewed  Labs: Basic Metabolic Panel: Recent Labs  Lab 09/18/19 0334 09/18/19 0629 09/19/19 0336 09/19/19 0350 09/20/19 0324 09/20/19 0340 09/20/19 1656 09/21/19 0016 09/21/19 0439 09/21/19 0447 09/21/19 1620 09/21/19 1638 09/21/19 2020 09/22/19 0424 09/22/19 0433  NA 143   < > 145   < > 142   < > 142   < > 147*   < > 148* 147* 148* 148* 150*  K 4.0   < > 3.9   < > 3.8   < > 4.3   < > 3.6   < > 4.6 4.6 4.1 3.8 3.8  CL 107   < > 100   < > 97*  --  97*  --  99  --   --  103  --  105  --   CO2 31   < > 34*   < > 34*  --  34*  --  36*  --   --  34*  --  32  --   GLUCOSE 120*   < > 118*   < > 168*  --  225*  --  116*  --   --  271*  --  181*  --   BUN 102*   < > 114*   < > 118*  --  124*  --  134*  --   --  147*  --  138*  --   CREATININE 1.01   < > 1.03   < > 1.07  --  1.04  --  1.01  --   --  1.08  --  1.00  --   CALCIUM 8.7*   < > 9.1   < > 9.1   < > 9.1  --  9.1  --   --  9.1  --  9.0  --   MG 1.8  --  2.1  --  2.2  --   --   --  2.4  --   --   --   --  2.5*  --   PHOS 4.4  --  4.2  --  4.1  --   --   --  3.9  --   --   --   --  3.7  --    < > = values in this interval not displayed.    Liver Function Tests: No results for input(s): AST, ALT, ALKPHOS, BILITOT, PROT, ALBUMIN in the last 168 hours. No results for input(s): LIPASE, AMYLASE in the last 168 hours. No results for input(s): AMMONIA in the last 168 hours.  CBC: Recent Labs  Lab 09/20/19 1656 09/21/19 0016 09/21/19 0439 09/21/19 0447 09/21/19 1638 09/21/19 2018 09/21/19 2020 09/22/19 0424 09/22/19 0433  WBC 14.8*  --  12.2*  --  13.4* 11.3*  --  9.4  --   HGB 9.4*   < > 9.0*   < > 8.4* 8.1* 8.2* 8.0* 8.2*  HCT 32.5*   < > 31.1*   < > 29.5* 28.1* 24.0* 27.5* 24.0*  MCV 93.9  --  94.0  --  95.8 96.9  --  95.5  --   PLT 190  --  172  --  169 160  --  144*  --    < > = values in this interval not displayed.    Cardiac Enzymes: No results for  input(s): CKTOTAL, CKMB, CKMBINDEX, TROPONINI in the last 168 hours.  BNP: BNP (last 3 results) Recent Labs    07/22/19 1039  BNP 15.3    ProBNP (last 3 results) No results for input(s): PROBNP in the last 8760 hours.    Other results:  Imaging: DG CHEST PORT 1 VIEW  Result Date: 09/21/2019 CLINICAL DATA:  COVID positive. Encounter for ECMO EXAM: PORTABLE CHEST 1 VIEW COMPARISON:  Chest x-rays dated 09/20/2019 in 09/19/2019. FINDINGS: Stable cardiomegaly. ECMO cannula again seen over the RIGHT hemithorax. Tracheostomy tube remains appropriately positioned in the midline. Bilateral chest tubes are stable in position. Bilateral patchy interstitial and alveolar airspace opacities appear stable. No new lung findings. No pleural effusion or pneumothorax is seen. IMPRESSION: 1. Stable chest x-ray. Bilateral interstitial and alveolar airspace opacities are unchanged, compatible with multifocal pneumonia versus pulmonary edema. 2. Stable cardiomegaly. Electronically Signed   By: Franki Cabot M.D.   On: 09/21/2019 10:21     Medications:     Scheduled Medications: . acetaZOLAMIDE  500 mg Per Tube BID  . amiodarone  200 mg Oral Daily  . vitamin C  500 mg Per Tube Daily  . chlorhexidine gluconate (MEDLINE KIT)  15 mL Mouth Rinse BID  . Chlorhexidine Gluconate Cloth  6 each Topical Daily  . cloNIDine  0.1 mg Oral Q6H  . diazepam  10 mg Oral Q6H   Followed by  . diazepam  10 mg Oral Q6H  . feeding supplement (OSMOLITE 1.5 CAL)  237 mL Per Tube 6  X Daily  . feeding supplement (PRO-STAT SUGAR FREE 64)  60 mL Per Tube QID  . free water  200 mL Per Tube Q6H  . gabapentin  200 mg Per Tube Q8H  . HYDROmorphone  8 mg Oral Q6H   Followed by  . [START ON 09/25/2019] HYDROmorphone  4 mg Oral Q6H   Followed by  . [START ON 09/28/2019] HYDROmorphone  2 mg Oral Q6H  . insulin aspart  0-20 Units Subcutaneous Q4H  . ipratropium-albuterol  3 mL Nebulization Q4H  . mouth rinse  15 mL Mouth Rinse 10  times per day  . montelukast  10 mg Per Tube QHS  . nutrition supplement (JUVEN)  1 packet Per Tube BID BM  . pantoprazole sodium  40 mg Per Tube BID  . propranolol  40 mg Per Tube TID  . psyllium  1 packet Oral BID  . QUEtiapine  50 mg Per Tube BID  . sodium chloride flush  10-40 mL Intracatheter Q12H  . sodium chloride flush  10-40 mL Intracatheter Q12H  . white petrolatum   Topical BID    Infusions: . sodium chloride    . sodium chloride Stopped (09/05/19 1900)  . sodium chloride 10 mL/hr at 09/21/19 1716  . dexmedetomidine (PRECEDEX) IV infusion Stopped (09/22/19 0733)  . dextrose 5 % and 0.45% NaCl 75 mL/hr at 09/22/19 0800  . heparin 1,700 Units/hr (09/22/19 0800)    PRN Medications: Place/Maintain arterial line **AND** sodium chloride, sodium chloride, sodium chloride, acetaminophen (TYLENOL) oral liquid 160 mg/5 mL, acetaminophen, hydrALAZINE, [COMPLETED] HYDROmorphone **FOLLOWED BY** HYDROmorphone **FOLLOWED BY** [START ON 09/25/2019] HYDROmorphone **FOLLOWED BY** [START ON 09/28/2019] HYDROmorphone **FOLLOWED BY** [START ON 10/01/2019] HYDROmorphone, magic mouthwash, magnesium hydroxide, ondansetron (ZOFRAN) IV, senna-docusate, sodium chloride, sodium chloride flush   Assessment/Plan:    1. Acute hypoxic/hypercarbic respiratory failure due to COVID PNA: Remained markedly hypoxic despite full support. Intubated 12/26. S/p bilateral CTs 12/26 for pneumothoraces.  TEE on 12/26 LVEF 70% RV ok. VV ECMO begun 12/26. Tracheostomy 12/29.  COVID ARDS at this point. ECMO circuit changed 1/8 & 1/23. Crescent cannula placed 2/2 (femoral cannuals removed) - ECMO parameters as per ECMO note - Flows look  good on Crescent though did have low flow overnight. Improved with IVF - Hgb dropping CXR slightly worse. ? Need for bronch  - Sweep 11. Wean as tolerated - Volume status low. BUN climbing. Remains on IVF.  - LDH 289-> 365 -> 384  -> 431 -> 424 -> 379 -> 307 - Sweep trial continues to  improve. Will do 20 min trial today. D/w Dr. [place] Bells and can be a bit more permissive with pCO2 - Continue heparin. Discussed dosing with PharmD personally. - Continue chest PT - Continue regular episodes of awakening and letting him work interspersed with sedation and rest periods.  2. Bilateral PTX: Due to barotrauma.  - Resolved  3. COVID PNA: management of COVID infection per CCM. CXR with bilateral multifocal PNA progressing to likely ARDS.  - s/p 10 days of remdesivir - 12/16, 12/2 s/p tociluzimab - 12/17 s/p convalescent plasma - Decadron at 31m/daily completed 1/11 - Completed cefepime/vanc - Off Ancef  4. ID: Empiric coverage with vancomycin/cefepime initially for possible secondary bacterial PNA, course completed.   - treated with cefepime and vanc for + cx with staph epidimidis, MSSA and serratia (sensitivities ok). - Currently off abx - BAL cx. With serratia and enterococcus felt to be colonization - No fever WBC stable  - Continue to follow  closely  5. Anemia: - hgb 10.5 -> 8.2  today. Transfuse as needed to keep Hgb> 8.0 - discussed with Pulmonary for possible bronch  7. FEN - now with G-tube.Continue TFs  8. Hypernatremia - NA 155 -> 143 -> 145 -> 142 -> 146 -> 150 - Increase FW and continue IVF  9. AKI - Volume status much improved with lasix gtt. BUN continues to climb. Lasix stopped yesterday. Now on IVF. Increase FW to 400 q6 -  Possibility of CVVHD d/w Renal. No current indication for HD. D/w Renal. Would consider CVVHD once sedation weaned and felt to be uremic  10. DTIs - WOC following.   D/w with ECMO team (ECMO coordinator/specialists, CCM, TCTS and pharmD) at bedside on multidisciplinary rounds. Continue to await ARDS recovery. Details as above.   Length of Stay: 29  CRITICAL CARE Performed by: Glori Bickers  Total critical care time: 40  minutes  Critical care time was exclusive of separately billable procedures and treating other  patients.  Critical care was necessary to treat or prevent imminent or life-threatening deterioration.  Critical care was time spent personally by me (independent of midlevel providers or residents) on the following activities: development of treatment plan with patient and/or surrogate as well as nursing, discussions with consultants, evaluation of patient's response to treatment, examination of patient, obtaining history from patient or surrogate, ordering and performing treatments and interventions, ordering and review of laboratory studies, ordering and review of radiographic studies, pulse oximetry and re-evaluation of patient's condition.    Glori Bickers MD 09/22/2019, 8:33 AM  Advanced Heart Failure Team Pager [phone] (M-F; 7a - 4p)  Please contact [facility] for night-coverage after hours (4p -7a ) and weekends on [email or web address]

## 2019-09-22 NOTE — Progress Notes (Signed)
11 Days Post-Op Procedure(s) (LRB): PEG TUBE INSERTION - BEDSIDE (N/A) ESOPHAGOGASTRODUODENOSCOPY (EGD) (N/A) Subjective: VV ECMO for Covid pneumonia and ARDS Patient remained stable on current ECMO support Received 1 unit of packed cells last night for low mean arterial pressure and hemoglobin 8.0 Plan further sweep trials daily Objective: Vital signs in last 24 hours: Temp:  [97.9 F (36.6 C)-98.6 F (37 C)] 98.6 F (37 C) (02/14 1030) Pulse Rate:  [80-95] 90 (02/14 1145) Cardiac Rhythm: Normal sinus rhythm (02/14 0715) Resp:  [19-37] 32 (02/14 1145) BP: (142)/(58) 142/58 (02/13 1525) SpO2:  [95 %-100 %] 96 % (02/14 1145) Arterial Line BP: (80-166)/(39-68) 128/50 (02/14 1145) FiO2 (%):  [40 %-60 %] 40 % (02/14 1000) Weight:  [131 kg] 131 kg (02/14 0645)  Hemodynamic parameters for last 24 hours: CVP:  [0 mmHg-9 mmHg] 9 mmHg  Intake/Output from previous day: 02/13 0701 - 02/14 0700 In: 4249.4 [I.V.:1598.9; Blood:423.3; NG/GT:1785; IV Piggyback:212.2] Out: 5105 [Urine:4885; Stool:200; Chest Tube:20] Intake/Output this shift: Total I/O In: 832.8 [I.V.:280.8; Blood:315; NG/GT:237] Out: 575 [Urine:575]  Cannulation sites clean and without bleeding Chest x-ray remains unchanged with moderate diffuse opacification Maintaining sinus rhythm Adequate urine output off Lasix drip and BUN starting to improve  Lab Results: Recent Labs    09/21/19 2018 09/21/19 2020 09/22/19 0424 09/22/19 0433 09/22/19 1019 09/22/19 1048  WBC 11.3*  --  9.4  --   --   --   HGB 8.1*   < > 8.0*   < > 10.2* 10.2*  HCT 28.1*   < > 27.5*   < > 30.0* 30.0*  PLT 160  --  144*  --   --   --    < > = values in this interval not displayed.   BMET:  Recent Labs    09/21/19 1638 09/21/19 2020 09/22/19 0424 09/22/19 0433 09/22/19 1019 09/22/19 1048  NA 147*   < > 148*   < > 151* 150*  K 4.6   < > 3.8   < > 4.6 4.8  CL 103  --  105  --   --   --   CO2 34*  --  32  --   --   --   GLUCOSE 271*   --  181*  --   --   --   BUN 147*  --  138*  --   --   --   CREATININE 1.08  --  1.00  --   --   --   CALCIUM 9.1  --  9.0  --   --   --    < > = values in this interval not displayed.    PT/INR: No results for input(s): LABPROT, INR in the last 72 hours. ABG    Component Value Date/Time   PHART 7.392 09/22/2019 1048   HCO3 33.4 (H) 09/22/2019 1048   TCO2 35 (H) 09/22/2019 1048   ACIDBASEDEF 4.0 (H) 09/16/2019 2334   O2SAT 93.0 09/22/2019 1048   CBG (last 3)  Recent Labs    09/22/19 0322 09/22/19 0717 09/22/19 1123  GLUCAP 176* 182* 258*    Assessment/Plan: S/P Procedure(s) (LRB): PEG TUBE INSERTION - BEDSIDE (N/A) ESOPHAGOGASTRODUODENOSCOPY (EGD) (N/A) Continue VV ECMO strategy for post Covid pneumonia-ARDS   LOS: 62 days    [name] 09/22/2019

## 2019-09-22 NOTE — Progress Notes (Signed)
ANTICOAGULATION CONSULT NOTE - Follow Up Consult  Pharmacy Consult for heparin Indication: ECMO  Labs: Recent Labs    09/21/19 0439 09/21/19 0447 09/21/19 1638 09/21/19 1638 09/21/19 2018 09/21/19 2018 09/21/19 2020 09/21/19 2020 09/22/19 0424 09/22/19 0433  HGB 9.0*   < > 8.4*   < > 8.1*   < > 8.2*   < > 8.0* 8.2*  HCT 31.1*   < > 29.5*   < > 28.1*   < > 24.0*  --  27.5* 24.0*  PLT 172   < > 169  --  160  --   --   --  144*  --   APTT 84*  --  99*  --   --   --   --   --  94*  --   HEPARINUNFRC 0.46  --  0.44  --   --   --   --   --  0.50  --   CREATININE 1.01  --  1.08  --   --   --   --   --  1.00  --    < > = values in this interval not displayed.    Assessment: 44yo male on ECMO heparin drip 1900 units/hr,  Heparin level rising to top of therapeutic range this am at 0.5, Hgb fell overnight - receiving PRBC,PLTC stable 170s, LDH stble 300-400, Fibrinogen stable 600  no bleeding noted, orange tinged urine Nose bleeding and trach bleeding resolved.   Goal of Therapy:  Heparin level 0.3-0.5 units/ml (aiming for lowest end)   Plan:  -decrease heparin drip at 1700 units/hr -Check heparin level q12h -Monitor for s/sx of bleeding   Bonnita Nasuti Pharm.D. CPP, BCPS Clinical Pharmacist [phone] 09/22/2019 9:13 AM    Please check AMION for all [place] numbers 09/22/2019

## 2019-09-22 NOTE — CV Procedure (Signed)
   ECMO NOTE:  Indication: Respiratory failure due to COVID PNA  Initial cannulation date: 08/03/2019  ECMO type: VV ECMO (Cardio-Help)  Dual lumen  Inflow/return cannula:  1) 32 FR Crescent placed 2/2  ECMO events:  Initial cannulation 12/26 Circuit changed and trach exchanged on 1/8.   Underwent addition of a RIJ drainage cannula on 1/20 Circuit changed on 1/22 for rising LDH Had evidence of recirculation and RIJ cannula pulled back on 1/26 19 FR RIJ removed 09/06/19 Underwent change to [facility] catheter with removal of femoral lines 2/2 Sweep trial 2/11 for 5 mins with O2 sats staying 100% pCO up to 77 Sweep trial 2/13 8 mins pCO2 57-> 70. Sats 100%  Daily data:  Remains on VV ecmo with good flows.    Flow 3.54 RPM 2825 dP 24 Pven  -32 Sweep  9 SVO2 73%  Labs:  Hgb 10.2 -> 8.2 LDH 297-> 365 -> 384 -> 431 -> 424 -> 379 -> 307 Heparin level: 0.50 Lactic acid 0.8  Plan:  Crescent cannula working well Excellent flow and sats. Now weaning flow CXR relatively stable stable.  Will continue to wean sedation. Repeat Sweep trial today BUN and bicarb rising c/w contraction alkalosis. Continue IVF Watch DTIs If hgb continues to drop, consider bronch  Glori Bickers, MD  8:34 AM

## 2019-09-22 NOTE — Plan of Care (Signed)
  Problem: Respiratory: Goal: Complications related to the disease process, condition or treatment will be avoided or minimized 09/22/2019 2155 by Reinaldo Berber, RN Outcome: Progressing 09/22/2019 2154 by Reinaldo Berber, RN Outcome: Progressing   Problem: Respiratory: Goal: Ability to maintain a clear airway and adequate ventilation will improve 09/22/2019 2155 by Reinaldo Berber, RN Outcome: Progressing 09/22/2019 2154 by Reinaldo Berber, RN Outcome: Progressing  Problem: Activity: Goal: Risk for activity intolerance will decrease 09/22/2019 2155 by Reinaldo Berber, RN Outcome: Not Progressing Note: Needs physical therapy 09/22/2019 2154 by Reinaldo Berber, RN Outcome: Not Progressing Note: Needs physical therapy   Problem: Nutrition: Goal: Adequate nutrition will be maintained 09/22/2019 2155 by Reinaldo Berber, RN Outcome: Progressing 09/22/2019 2154 by Reinaldo Berber, RN Outcome: Progressing   Problem: Coping: Goal: Level of anxiety will decrease Outcome: Progressing   Problem: Elimination: Goal: Will not experience complications related to bowel motility 09/22/2019 2155 by Reinaldo Berber, RN Outcome: Not Progressing 09/22/2019 2154 by Reinaldo Berber, RN Outcome: Progressing Goal: Will not experience complications related to urinary retention Outcome: Progressing   Problem: Pain Managment: Goal: General experience of comfort will improve 09/22/2019 2155 by Reinaldo Berber, RN Outcome: Progressing 09/22/2019 2154 by Reinaldo Berber, RN Outcome: Progressing   Problem: Safety: Goal: Ability to remain free from injury will improve 09/22/2019 2155 by Reinaldo Berber, RN Outcome: Progressing 09/22/2019 2154 by Reinaldo Berber, RN Outcome: Progressing   Problem: Skin Integrity: Goal: Risk for impaired skin integrity will decrease 09/22/2019 2155 by Reinaldo Berber, RN Outcome: Not Progressing Note: WOC involved, all precautions and  measures to care for skin in place 09/22/2019 2154 by Reinaldo Berber, RN Outcome: Progressing   Problem: Clinical Measurements: Goal: Ability to maintain clinical measurements within normal limits will improve 09/22/2019 2155 by Reinaldo Berber, RN Outcome: Progressing 09/22/2019 2154 by Reinaldo Berber, RN Outcome: Progressing Goal: Will remain free from infection 09/22/2019 2155 by Reinaldo Berber, RN Outcome: Progressing 09/22/2019 2154 by Reinaldo Berber, RN Outcome: Progressing Goal: Diagnostic test results will improve 09/22/2019 2155 by Reinaldo Berber, RN Outcome: Progressing 09/22/2019 2154 by Reinaldo Berber, RN Outcome: Progressing Goal: Respiratory complications will improve 09/22/2019 2155 by Reinaldo Berber, RN Outcome: Progressing 09/22/2019 2154 by Reinaldo Berber, RN Outcome: Progressing Goal: Cardiovascular complication will be avoided 09/22/2019 2155 by Reinaldo Berber, RN Outcome: Progressing 09/22/2019 2154 by Reinaldo Berber, RN Outcome: Progressing

## 2019-09-22 NOTE — Progress Notes (Signed)
Sweep Trial  ABG PH 7.42 Pco2 52.1  Pao2 80  Trial began at 0949  ABG @ 37mn PH 7.28 Pco2 75.1 Pao2 118   ABG @ 215m PH 7.24 Pco2  81.7 Pao2 105  ABG @ 3030mPH 7.22 Pco2 87.5 Pao2 110  Trial ended at 1017. Sweep returned to 9

## 2019-09-22 NOTE — Progress Notes (Signed)
NAME:  [name], MRN:  [id number], DOB:  1976-06-06, LOS: 60 ADMISSION DATE:  07/22/2019, CONSULTATION DATE:  12/24 REFERRING MD:  Sloan Leiter, CHIEF COMPLAINT:  Dyspnea   Brief History   44 y/o male admitted 12/14 with COVID pneumonia leading to ARDS, pneumomediastinum.  Intubated 12/26, bilateral chest tubes placed.  Past Medical History  GERD HTN Asthma  Significant Hospital Events   12/14 admit with hypoxemic respiratory failure in setting of COVID-19 pneumonia 12/23 noted to have pneumomediastinum on chest x-ray 12/24PCCM consulted for evaluation 12/26 worsening hypoxemia, intubated 12/27 VV fem/fem ECMO cannulation 12/29 tracheostomy >> See bedside nursing event notes for full rundown of procedures after 12/27 1/5 epistaxis, nasal packing 1/30 improved cxr, improved lung compliance, TV 350s on 15PC  1/31 CXR increased infiltrates, +CFB     2/2 Converted to right IJ Crescent cannula. 2/3 PEG tube inserted at bedside 2/14 30 min sweep trial> paO2 105 (18 PEEP, 60% FiO2) pH 7.22, pCO2 87  Consults:  PCCM, CTS, CHF, Palliative  Procedures:  PICC 1/3 >> R radial a-line 1/20 >> R IJ ECMO cath 2/3 >> Tracheostomy 12/29 >>  Significant Diagnostic Tests:  CT chest 12/22>>extensive pneumomediastinum, multifocal patchy bilateral groundglass opacities CXR 1/25 >> improving bilateral infiltrates. ECMO cannulae in place. CXR 2/5 >> clearing bilateral infiltrates  Micro Data:  BCx2 12/14 >>negative  Sputum 12/15 >> OPF 12/28 blood > negative 12/29 > staph aureus 1/4 bronch:  1/2 acid fast smear bronch: negative 1/2 fungal cx bronch: negative1/12 BAL: serratia, staph epidermidisu 1/4 BAL > MSSA, Serratia 1/9 blood > negative 1/9 Resp culture > MSSA, serratia 1/11 serratia > negative 1/12 fungus culture > negative 1/12 BAL > serratia 1/18 BAL >> rare Serratia. 1/24 blood > negative 1/30 blood > negative 1/30 resp culture > serratia 2/1 BAL >> rare Serratia -  enterococcus 2/1 aspergillus Ag > negative 2/2 resp culture > serratia  2/2 blood > negative 2/5 urine > negative 2/9 resp: serratia  Antimicrobials:  Received remdesivir, steroids, actemra and convalescent plasma  5 days cefepime 12/24-12/29 Cefepime 1/4->1/13 vanc 1/4> 1/11 Ceftriaxone 1/14 > 1/27 vanc x1 1/20 Cefazolin 1/29 > 2/2 cefuroxim 2/2 vanc 2/2 Ceftriaxone x1 2/4 Interim history/subjective:  No acute events Sweep trial 30 minutes, pCO2 climbed from 52 to 87  Objective   Blood pressure (!) 142/58, pulse 89, temperature 98.6 F (37 C), resp. rate (!) 27, height _0  (1.727 m), weight 131 kg, SpO2 96 %. CVP:  [0 mmHg-9 mmHg] 9 mmHg  Vent Mode: PCV FiO2 (%):  [40 %-60 %] 40 % Set Rate:  [15 bmp-20 bmp] 15 bmp PEEP:  [14 cmH20-16 cmH20] 14 cmH20 Pressure Support:  [15 cmH20] 15 cmH20 Plateau Pressure:  [32 cmH20-40 cmH20] 37 cmH20   Intake/Output Summary (Last 24 hours) at 09/22/2019 1036 Last data filed at 09/22/2019 1000 Gross per 24 hour  Intake 4551.72 ml  Output 4835 ml  Net -283.28 ml   Filed Weights   09/20/19 0500 09/21/19 0715 09/22/19 0645  Weight: (!) 136.1 kg 132.1 kg 131 kg    Examination:  General:  In bed on vent HENT: NCAT trach in place, ECMO catheter in place PULM: CTA B, vent supported breathing CV: RRR, no mgr GI: BS+, soft, nontender, PEG MSK: normal bulk and tone Neuro: sedated on vent  ECMO: VV, cardiohelp, IJ catheter, flow ~3.9Lpm, SvO2 70%  2/13 CXR images personally reviewed> tracheostomy in place, ecmo catheter in place, bilateral airspace disease improved somewhat compared to the  January films.  Some interstitial changes  Resolved Hospital Problem list     Assessment & Plan:  ARDS due to COVID 19, requiring mechanical ventilation and ECMO support > oxygenation has improved significantly In general would recommend weaning sedation, reducing sweep to attain what we would normally see in a patient with advanced ARDS at  this point: high pCO2 from increased dead space (target around pCO2 90).   Would also favor reducing PEEP slowly this week Agree with stopping diamox to allow kidneys to catch up with normal buffering for where his pCO2 will live with this degree of lung injury VAP prevention Trach care per routine  Hypernatremia Continue free water Metamucil  AKI with azotemia Monitor for bleeding Transfuse PRBC for Hgb < 7 gm/dL Continue to hold off on CRRT given adequate UOP  Nutrition, ileus? > no evidence of that Continue bolus feeding 214m q6h  Blood tinged GI secretions > none on 2/13 Monitor  Sedation needs Acute metabolic encephalopathy> medication related Wean oral dilaudid and valium  Clonidine for hypertension management given duration of precedex use  Hypertension Clonidine Propranolol  Serratia pneumonia> treated Monitor for fever  Neuro muscular weakness PT involvement soon > range of motion exercises, etc   Best practice:  Pain/Anxiety/Delirium protocol (if indicated): keep infusions off. Continue enteral agents. Dexmedetomidine and prn Versed and Dilaudid. Begin to taper enteral sedative.  VAP protocol (if indicated): Bundle in place. Respiratory support goals: Pressure control ventilation with ultra protective lung ventilation. Titrate FiO2 down again. Continue PEEP 16  Blood pressure target:  MAP>65, off pressors DVT prophylaxis: Remains on systemic heparin - target Heparin level 0.3-0.7 Nutrition Status: Nutrition Problem: Increased nutrient needs Etiology: acute illness(COVID PNA) Signs/Symptoms: estimated needs Interventions: Tube feeding, Prostat  Bolus feed. GI prophylaxis: Protonix per tube, change to BID today Fluid status goals: cont lasix infusion Urinary catheter: Guide hemodynamic management Glucose control: Euglycemic control with SSI Mobility/therapy needs: Bedrest.  Daily labs: as per ECMO protocol. Code Status: Full code  Family Communication:  I called his wife on 2/13 Disposition: ICU  Labs   CBC: Recent Labs  Lab 09/20/19 1656 09/21/19 0016 09/21/19 0439 09/21/19 0447 09/21/19 1638 09/21/19 2018 09/21/19 2020 09/22/19 0424 09/22/19 0433  WBC 14.8*  --  12.2*  --  13.4* 11.3*  --  9.4  --   HGB 9.4*   < > 9.0*   < > 8.4* 8.1* 8.2* 8.0* 8.2*  HCT 32.5*   < > 31.1*   < > 29.5* 28.1* 24.0* 27.5* 24.0*  MCV 93.9  --  94.0  --  95.8 96.9  --  95.5  --   PLT 190  --  172  --  169 160  --  144*  --    < > = values in this interval not displayed.    Basic Metabolic Panel: Recent Labs  Lab 09/18/19 0334 09/18/19 0629 09/19/19 0336 09/19/19 0350 09/20/19 0324 09/20/19 0340 09/20/19 1656 09/21/19 0016 09/21/19 0439 09/21/19 0447 09/21/19 1620 09/21/19 1638 09/21/19 2020 09/22/19 0424 09/22/19 0433  NA 143   < > 145   < > 142   < > 142   < > 147*   < > 148* 147* 148* 148* 150*  K 4.0   < > 3.9   < > 3.8   < > 4.3   < > 3.6   < > 4.6 4.6 4.1 3.8 3.8  CL 107   < > 100   < > 97*  --  97*  --  99  --   --  103  --  105  --   CO2 31   < > 34*   < > 34*  --  34*  --  36*  --   --  34*  --  32  --   GLUCOSE 120*   < > 118*   < > 168*  --  225*  --  116*  --   --  271*  --  181*  --   BUN 102*   < > 114*   < > 118*  --  124*  --  134*  --   --  147*  --  138*  --   CREATININE 1.01   < > 1.03   < > 1.07  --  1.04  --  1.01  --   --  1.08  --  1.00  --   CALCIUM 8.7*   < > 9.1   < > 9.1  --  9.1  --  9.1  --   --  9.1  --  9.0  --   MG 1.8  --  2.1  --  2.2  --   --   --  2.4  --   --   --   --  2.5*  --   PHOS 4.4  --  4.2  --  4.1  --   --   --  3.9  --   --   --   --  3.7  --    < > = values in this interval not displayed.   GFR: Estimated Creatinine Clearance: 123.2 mL/min (by C-G formula based on SCr of 1 mg/dL). Recent Labs  Lab 09/19/19 0336 09/19/19 1608 09/20/19 0324 09/20/19 1656 09/21/19 0439 09/21/19 1638 09/21/19 2018 09/22/19 0424  WBC 10.3   < > 11.9*   < > 12.2* 13.4* 11.3* 9.4  LATICACIDVEN  1.3  --  1.3  --  0.9  --   --  0.8   < > = values in this interval not displayed.    Liver Function Tests: No results for input(s): AST, ALT, ALKPHOS, BILITOT, PROT, ALBUMIN in the last 168 hours. No results for input(s): LIPASE, AMYLASE in the last 168 hours. No results for input(s): AMMONIA in the last 168 hours.  ABG    Component Value Date/Time   PHART 7.431 09/22/2019 0433   PCO2ART 54.0 (H) 09/22/2019 0433   PO2ART 84.0 09/22/2019 0433   HCO3 35.9 (H) 09/22/2019 0433   TCO2 38 (H) 09/22/2019 0433   ACIDBASEDEF 4.0 (H) 09/16/2019 2334   O2SAT 78.5 09/22/2019 0448     Coagulation Profile: No results for input(s): INR, PROTIME in the last 168 hours.  Cardiac Enzymes: No results for input(s): CKTOTAL, CKMB, CKMBINDEX, TROPONINI in the last 168 hours.  HbA1C: Hgb A1c MFr Bld  Date/Time Value Ref Range Status  07/24/2019 04:50 AM 6.7 (H) 4.8 - 5.6 % Final    Comment:    (NOTE) Pre diabetes:          5.7%-6.4% Diabetes:              >6.4% Glycemic control for   <7.0% adults with diabetes   09/30/2017 05:36 AM 5.7 (H) 4.8 - 5.6 % Final    Comment:    (NOTE)         Prediabetes: 5.7 - 6.4         Diabetes: >6.4  Glycemic control for adults with diabetes: <7.0     CBG: Recent Labs  Lab 09/21/19 1618 09/21/19 1914 09/21/19 2313 09/22/19 0322 09/22/19 0717  GLUCAP 242* 301* 241* 176* 182*     Critical care time: 35 minutes     Roselie Awkward, MD [place] [facility] Pager: [phone] Cell: [phone] If no response, call [phone]

## 2019-09-23 ENCOUNTER — Inpatient Hospital Stay (HOSPITAL_COMMUNITY): Payer: Managed Care, Other (non HMO)

## 2019-09-23 DIAGNOSIS — J80 Acute respiratory distress syndrome: Secondary | ICD-10-CM

## 2019-09-23 DIAGNOSIS — Z8616 Personal history of COVID-19: Secondary | ICD-10-CM

## 2019-09-23 DIAGNOSIS — Z9911 Dependence on respirator [ventilator] status: Secondary | ICD-10-CM

## 2019-09-23 LAB — POCT I-STAT 7, (LYTES, BLD GAS, ICA,H+H)
Acid-Base Excess: 5 mmol/L — ABNORMAL HIGH (ref 0.0–2.0)
Acid-Base Excess: 4 mmol/L — ABNORMAL HIGH (ref 0.0–2.0)
Acid-Base Excess: 2 mmol/L (ref 0.0–2.0)
Acid-Base Excess: 2 mmol/L (ref 0.0–2.0)
Acid-Base Excess: 1 mmol/L (ref 0.0–2.0)
Acid-Base Excess: 2 mmol/L (ref 0.0–2.0)
Acid-Base Excess: 4 mmol/L — ABNORMAL HIGH (ref 0.0–2.0)
Bicarbonate: 31 mmol/L — ABNORMAL HIGH (ref 20.0–28.0)
Bicarbonate: 30.1 mmol/L — ABNORMAL HIGH (ref 20.0–28.0)
Bicarbonate: 27.3 mmol/L (ref 20.0–28.0)
Bicarbonate: 28 mmol/L (ref 20.0–28.0)
Bicarbonate: 27.5 mmol/L (ref 20.0–28.0)
Bicarbonate: 27.9 mmol/L (ref 20.0–28.0)
Bicarbonate: 29.4 mmol/L — ABNORMAL HIGH (ref 20.0–28.0)
Calcium, Ion: 1.35 mmol/L (ref 1.15–1.40)
Calcium, Ion: 1.35 mmol/L (ref 1.15–1.40)
Calcium, Ion: 1.34 mmol/L (ref 1.15–1.40)
Calcium, Ion: 1.35 mmol/L (ref 1.15–1.40)
Calcium, Ion: 1.33 mmol/L (ref 1.15–1.40)
Calcium, Ion: 1.35 mmol/L (ref 1.15–1.40)
Calcium, Ion: 1.33 mmol/L (ref 1.15–1.40)
HCT: 36 % — ABNORMAL LOW (ref 39.0–52.0)
HCT: 25 % — ABNORMAL LOW (ref 39.0–52.0)
HCT: 28 % — ABNORMAL LOW (ref 39.0–52.0)
HCT: 28 % — ABNORMAL LOW (ref 39.0–52.0)
HCT: 26 % — ABNORMAL LOW (ref 39.0–52.0)
HCT: 25 % — ABNORMAL LOW (ref 39.0–52.0)
HCT: 23 % — ABNORMAL LOW (ref 39.0–52.0)
Hemoglobin: 12.2 g/dL — ABNORMAL LOW (ref 13.0–17.0)
Hemoglobin: 8.5 g/dL — ABNORMAL LOW (ref 13.0–17.0)
Hemoglobin: 9.5 g/dL — ABNORMAL LOW (ref 13.0–17.0)
Hemoglobin: 9.5 g/dL — ABNORMAL LOW (ref 13.0–17.0)
Hemoglobin: 8.8 g/dL — ABNORMAL LOW (ref 13.0–17.0)
Hemoglobin: 8.5 g/dL — ABNORMAL LOW (ref 13.0–17.0)
Hemoglobin: 7.8 g/dL — ABNORMAL LOW (ref 13.0–17.0)
O2 Saturation: 98 %
O2 Saturation: 94 %
O2 Saturation: 94 %
O2 Saturation: 93 %
O2 Saturation: 95 %
O2 Saturation: 93 %
O2 Saturation: 94 %
Patient temperature: 37
Patient temperature: 37
Patient temperature: 36.9
Patient temperature: 37
Patient temperature: 36.8
Patient temperature: 37
Patient temperature: 36.8
Potassium: 4.6 mmol/L (ref 3.5–5.1)
Potassium: 4.9 mmol/L (ref 3.5–5.1)
Potassium: 4.5 mmol/L (ref 3.5–5.1)
Potassium: 4.6 mmol/L (ref 3.5–5.1)
Potassium: 4.6 mmol/L (ref 3.5–5.1)
Potassium: 5 mmol/L (ref 3.5–5.1)
Potassium: 5.1 mmol/L (ref 3.5–5.1)
Sodium: 151 mmol/L — ABNORMAL HIGH (ref 135–145)
Sodium: 152 mmol/L — ABNORMAL HIGH (ref 135–145)
Sodium: 152 mmol/L — ABNORMAL HIGH (ref 135–145)
Sodium: 150 mmol/L — ABNORMAL HIGH (ref 135–145)
Sodium: 151 mmol/L — ABNORMAL HIGH (ref 135–145)
Sodium: 150 mmol/L — ABNORMAL HIGH (ref 135–145)
Sodium: 149 mmol/L — ABNORMAL HIGH (ref 135–145)
TCO2: 32 mmol/L (ref 22–32)
TCO2: 32 mmol/L (ref 22–32)
TCO2: 29 mmol/L (ref 22–32)
TCO2: 30 mmol/L (ref 22–32)
TCO2: 29 mmol/L (ref 22–32)
TCO2: 29 mmol/L (ref 22–32)
TCO2: 31 mmol/L (ref 22–32)
pCO2 arterial: 51.3 mmHg — ABNORMAL HIGH (ref 32.0–48.0)
pCO2 arterial: 50.9 mmHg — ABNORMAL HIGH (ref 32.0–48.0)
pCO2 arterial: 47.6 mmHg (ref 32.0–48.0)
pCO2 arterial: 51.9 mmHg — ABNORMAL HIGH (ref 32.0–48.0)
pCO2 arterial: 50.4 mmHg — ABNORMAL HIGH (ref 32.0–48.0)
pCO2 arterial: 49.7 mmHg — ABNORMAL HIGH (ref 32.0–48.0)
pCO2 arterial: 48.1 mmHg — ABNORMAL HIGH (ref 32.0–48.0)
pH, Arterial: 7.389 (ref 7.350–7.450)
pH, Arterial: 7.379 (ref 7.350–7.450)
pH, Arterial: 7.367 (ref 7.350–7.450)
pH, Arterial: 7.341 — ABNORMAL LOW (ref 7.350–7.450)
pH, Arterial: 7.344 — ABNORMAL LOW (ref 7.350–7.450)
pH, Arterial: 7.357 (ref 7.350–7.450)
pH, Arterial: 7.393 (ref 7.350–7.450)
pO2, Arterial: 108 mmHg (ref 83.0–108.0)
pO2, Arterial: 75 mmHg — ABNORMAL LOW (ref 83.0–108.0)
pO2, Arterial: 75 mmHg — ABNORMAL LOW (ref 83.0–108.0)
pO2, Arterial: 74 mmHg — ABNORMAL LOW (ref 83.0–108.0)
pO2, Arterial: 79 mmHg — ABNORMAL LOW (ref 83.0–108.0)
pO2, Arterial: 72 mmHg — ABNORMAL LOW (ref 83.0–108.0)
pO2, Arterial: 72 mmHg — ABNORMAL LOW (ref 83.0–108.0)

## 2019-09-23 LAB — BASIC METABOLIC PANEL
Anion gap: 11 (ref 5–15)
Anion gap: 12 (ref 5–15)
BUN: 127 mg/dL — ABNORMAL HIGH (ref 6–20)
BUN: 125 mg/dL — ABNORMAL HIGH (ref 6–20)
CO2: 29 mmol/L (ref 22–32)
CO2: 27 mmol/L (ref 22–32)
Calcium: 9.2 mg/dL (ref 8.9–10.3)
Calcium: 9.1 mg/dL (ref 8.9–10.3)
Chloride: 112 mmol/L — ABNORMAL HIGH (ref 98–111)
Chloride: 112 mmol/L — ABNORMAL HIGH (ref 98–111)
Creatinine, Ser: 1.02 mg/dL (ref 0.61–1.24)
Creatinine, Ser: 0.94 mg/dL (ref 0.61–1.24)
GFR calc Af Amer: >60 mL/min (ref >60)
GFR calc Af Amer: >60 mL/min (ref >60)
GFR calc non Af Amer: >60 mL/min (ref >60)
GFR calc non Af Amer: >60 mL/min (ref >60)
Glucose, Bld: 235 mg/dL — ABNORMAL HIGH (ref 70–99)
Glucose, Bld: 242 mg/dL — ABNORMAL HIGH (ref 70–99)
Potassium: 4.7 mmol/L (ref 3.5–5.1)
Potassium: 4.6 mmol/L (ref 3.5–5.1)
Sodium: 152 mmol/L — ABNORMAL HIGH (ref 135–145)
Sodium: 151 mmol/L — ABNORMAL HIGH (ref 135–145)

## 2019-09-23 LAB — GLUCOSE, CAPILLARY
Glucose-Capillary: 186 mg/dL — ABNORMAL HIGH (ref 70–99)
Glucose-Capillary: 194 mg/dL — ABNORMAL HIGH (ref 70–99)
Glucose-Capillary: 224 mg/dL — ABNORMAL HIGH (ref 70–99)
Glucose-Capillary: 242 mg/dL — ABNORMAL HIGH (ref 70–99)
Glucose-Capillary: 264 mg/dL — ABNORMAL HIGH (ref 70–99)
Glucose-Capillary: 272 mg/dL — ABNORMAL HIGH (ref 70–99)

## 2019-09-23 LAB — CBC
HCT: 28.7 % — ABNORMAL LOW (ref 39.0–52.0)
HCT: 30.4 % — ABNORMAL LOW (ref 39.0–52.0)
Hemoglobin: 8.2 g/dL — ABNORMAL LOW (ref 13.0–17.0)
Hemoglobin: 8.6 g/dL — ABNORMAL LOW (ref 13.0–17.0)
MCH: 27.4 pg (ref 26.0–34.0)
MCH: 27.9 pg (ref 26.0–34.0)
MCHC: 28.6 g/dL — ABNORMAL LOW (ref 30.0–36.0)
MCHC: 28.3 g/dL — ABNORMAL LOW (ref 30.0–36.0)
MCV: 96 fL (ref 80.0–100.0)
MCV: 98.7 fL (ref 80.0–100.0)
Platelets: 151 10*3/uL (ref 150–400)
Platelets: 155 10*3/uL (ref 150–400)
RBC: 2.99 MIL/uL — ABNORMAL LOW (ref 4.22–5.81)
RBC: 3.08 MIL/uL — ABNORMAL LOW (ref 4.22–5.81)
RDW: 17.5 % — ABNORMAL HIGH (ref 11.5–15.5)
RDW: 18 % — ABNORMAL HIGH (ref 11.5–15.5)
WBC: 12.8 10*3/uL — ABNORMAL HIGH (ref 4.0–10.5)
WBC: 20.7 10*3/uL — ABNORMAL HIGH (ref 4.0–10.5)
nRBC: 0.2 % (ref 0.0–0.2)
nRBC: 0.1 % (ref 0.0–0.2)

## 2019-09-23 LAB — COOXEMETRY PANEL
Carboxyhemoglobin: 2.7 % — ABNORMAL HIGH (ref 0.5–1.5)
Methemoglobin: 0.7 % (ref 0.0–1.5)
O2 Saturation: 79.8 %
Total hemoglobin: 8.2 g/dL — ABNORMAL LOW (ref 12.0–16.0)

## 2019-09-23 LAB — LACTIC ACID, PLASMA
Lactic Acid, Venous: 1.1 mmol/L (ref 0.5–1.9)
Lactic Acid, Venous: 1.6 mmol/L (ref 0.5–1.9)

## 2019-09-23 LAB — MAGNESIUM: Magnesium: 2.5 mg/dL — ABNORMAL HIGH (ref 1.7–2.4)

## 2019-09-23 LAB — FIBRINOGEN: Fibrinogen: 544 mg/dL — ABNORMAL HIGH (ref 210–475)

## 2019-09-23 LAB — APTT
aPTT: 69 seconds — ABNORMAL HIGH (ref 24–36)
aPTT: 67 seconds — ABNORMAL HIGH (ref 24–36)

## 2019-09-23 LAB — PHOSPHORUS: Phosphorus: 3 mg/dL (ref 2.5–4.6)

## 2019-09-23 LAB — HEMOGLOBIN A1C
Hgb A1c MFr Bld: 5.5 % (ref 4.8–5.6)
Mean Plasma Glucose: 111.15 mg/dL

## 2019-09-23 LAB — HEPARIN LEVEL (UNFRACTIONATED)
Heparin Unfractionated: 0.34 IU/mL (ref 0.30–0.70)
Heparin Unfractionated: 0.32 IU/mL (ref 0.30–0.70)

## 2019-09-23 LAB — LACTATE DEHYDROGENASE: LDH: 350 U/L — ABNORMAL HIGH (ref 98–192)

## 2019-09-23 MED ORDER — ALBUMIN HUMAN 5 % IV SOLN
INTRAVENOUS | Status: AC
  Administered 2019-09-23: 12.5 g via INTRAVENOUS
  Filled 2019-09-23: qty 250

## 2019-09-23 MED ORDER — VANCOMYCIN HCL 10 G IV SOLR
2500.0000 mg | Freq: Once | INTRAVENOUS | Status: AC
  Administered 2019-09-23: 16:00:00 2500 mg via INTRAVENOUS
  Filled 2019-09-23: qty 2500

## 2019-09-23 MED ORDER — INSULIN ASPART 100 UNIT/ML ~~LOC~~ SOLN
6.0000 [IU] | SUBCUTANEOUS | Status: DC
  Administered 2019-09-23 – 2019-09-24 (×8): 6 [IU] via SUBCUTANEOUS

## 2019-09-23 MED ORDER — MIDAZOLAM HCL (PF) 10 MG/2ML IJ SOLN: 5.0000 mg | INTRAMUSCULAR | Status: DC | PRN

## 2019-09-23 MED ORDER — ALBUMIN HUMAN 5 % IV SOLN: 12.5000 g | Freq: Once | INTRAVENOUS | Status: AC

## 2019-09-23 MED ORDER — DEXTROSE 5 % IV SOLN: INTRAVENOUS | Status: DC

## 2019-09-23 MED ORDER — PIPERACILLIN-TAZOBACTAM 3.375 G IVPB: 3.3750 g | Freq: Three times a day (TID) | INTRAVENOUS | Status: DC

## 2019-09-23 MED ORDER — MIDAZOLAM HCL 2 MG/2ML IJ SOLN
5.0000 mg | INTRAMUSCULAR | Status: DC | PRN
  Administered 2019-09-23 – 2019-10-06 (×4): 5 mg via INTRAVENOUS
  Filled 2019-09-23 (×5): qty 6

## 2019-09-23 MED ORDER — INSULIN ASPART 100 UNIT/ML ~~LOC~~ SOLN
4.0000 [IU] | SUBCUTANEOUS | Status: DC
  Administered 2019-09-23: 4 [IU] via SUBCUTANEOUS

## 2019-09-23 MED ORDER — QUETIAPINE FUMARATE 50 MG PO TABS
50.0000 mg | ORAL_TABLET | Freq: Every day | ORAL | Status: DC
  Administered 2019-09-23 – 2019-09-29 (×7): 50 mg
  Filled 2019-09-23 (×7): qty 1

## 2019-09-23 MED ORDER — CHLORHEXIDINE GLUCONATE CLOTH 2 % EX PADS
6.0000 | MEDICATED_PAD | Freq: Every day | CUTANEOUS | Status: DC
  Administered 2019-09-23 – 2019-11-13 (×49): 6 via TOPICAL

## 2019-09-23 MED ORDER — PRO-STAT SUGAR FREE PO LIQD
60.0000 mL | Freq: Four times a day (QID) | ORAL | Status: DC
  Administered 2019-09-23 – 2019-10-24 (×116): 60 mL
  Filled 2019-09-23 (×114): qty 60

## 2019-09-23 MED ORDER — FENTANYL CITRATE (PF) 100 MCG/2ML IJ SOLN
INTRAMUSCULAR | Status: AC
  Administered 2019-09-23: 12:00:00 50 ug via INTRAVENOUS
  Filled 2019-09-23: qty 2

## 2019-09-23 MED ORDER — PRO-STAT SUGAR FREE PO LIQD: 60.0000 mL | Freq: Four times a day (QID) | ORAL | Status: DC

## 2019-09-23 MED ORDER — VASOPRESSIN 20 UNIT/ML IV SOLN
0.0000 [IU]/min | INTRAVENOUS | Status: DC
  Administered 2019-09-23: 0.04 [IU]/min via INTRAVENOUS
  Filled 2019-09-23 (×2): qty 2

## 2019-09-23 MED ORDER — OSMOLITE 1.5 CAL PO LIQD: 237.0000 mL | Freq: Every day | ORAL | Status: DC

## 2019-09-23 MED ORDER — FENTANYL CITRATE (PF) 100 MCG/2ML IJ SOLN: 50.0000 ug | Freq: Once | INTRAMUSCULAR | Status: AC

## 2019-09-23 MED ORDER — OSMOLITE 1.5 CAL PO LIQD
237.0000 mL | Freq: Every day | ORAL | Status: DC
  Administered 2019-09-23 – 2019-10-24 (×172): 237 mL
  Filled 2019-09-23 (×197): qty 237

## 2019-09-23 MED ORDER — VANCOMYCIN HCL IN DEXTROSE 1-5 GM/200ML-% IV SOLN
1000.0000 mg | Freq: Three times a day (TID) | INTRAVENOUS | Status: DC
  Administered 2019-09-24 – 2019-09-25 (×5): 1000 mg via INTRAVENOUS
  Filled 2019-09-23 (×5): qty 200

## 2019-09-23 MED ORDER — DIAZEPAM 5 MG PO TABS
10.0000 mg | ORAL_TABLET | Freq: Two times a day (BID) | ORAL | Status: DC
  Administered 2019-09-23 – 2019-09-24 (×3): 10 mg
  Filled 2019-09-23 (×3): qty 2

## 2019-09-23 MED ORDER — SODIUM CHLORIDE 0.9 % IV SOLN
1.0000 g | Freq: Three times a day (TID) | INTRAVENOUS | Status: DC
  Administered 2019-09-23 – 2019-09-24 (×3): 1 g via INTRAVENOUS
  Filled 2019-09-23 (×4): qty 1

## 2019-09-23 NOTE — Progress Notes (Signed)
NAME:  [name], MRN:  [id number], DOB:  30-Jan-1976, LOS: 44 ADMISSION DATE:  07/22/2019, CONSULTATION DATE:  12/24 REFERRING MD:  Sloan Leiter, CHIEF COMPLAINT:  Dyspnea   Brief History   44 y/o male admitted 12/14 with COVID pneumonia leading to ARDS, pneumomediastinum.  Intubated 12/26, bilateral chest tubes placed.  Past Medical History  GERD HTN Asthma  Significant Hospital Events   12/14 admit with hypoxemic respiratory failure in setting of COVID-19 pneumonia 12/23 noted to have pneumomediastinum on chest x-ray 12/24PCCM consulted for evaluation 12/26 worsening hypoxemia, intubated 12/27 VV fem/fem ECMO cannulation 12/29 tracheostomy >> See bedside nursing event notes for full rundown of procedures after 12/27 1/5 epistaxis, nasal packing 1/30 improved cxr, improved lung compliance, TV 350s on 15PC  1/31 CXR increased infiltrates, +CFB     2/2 Converted to right IJ Crescent cannula. 2/3 PEG tube inserted at bedside 2/14 30 min sweep trial> paO2 105 (18 PEEP, 60% FiO2) pH 7.22, pCO2 87  Consults:  PCCM, CTS, CHF, Palliative  Procedures:  PICC 1/3 >> R radial a-line 1/20 >> R IJ ECMO cath 2/3 >> Tracheostomy 12/29 >>  Significant Diagnostic Tests:  CT chest 12/22>>extensive pneumomediastinum, multifocal patchy bilateral groundglass opacities CXR 1/25 >> improving bilateral infiltrates. ECMO cannulae in place. CXR 2/5 >> clearing bilateral infiltrates  Micro Data:  BCx2 12/14 >>negative  Sputum 12/15 >> OPF 12/28 blood > negative 12/29 > staph aureus 1/4 bronch:  1/2 acid fast smear bronch: negative 1/2 fungal cx bronch: negative1/12 BAL: serratia, staph epidermidisu 1/4 BAL > MSSA, Serratia 1/9 blood > negative 1/9 Resp culture > MSSA, serratia 1/11 serratia > negative 1/12 fungus culture > negative 1/12 BAL > serratia 1/18 BAL >> rare Serratia. 1/24 blood > negative 1/30 blood > negative 1/30 resp culture > serratia 2/1 BAL >> rare Serratia -  enterococcus 2/1 aspergillus Ag > negative 2/2 resp culture > serratia  2/2 blood > negative 2/5 urine > negative 2/9 resp: serratia  Antimicrobials:  Received remdesivir, steroids, actemra and convalescent plasma  5 days cefepime 12/24-12/29 Cefepime 1/4->1/13 vanc 1/4> 1/11 Ceftriaxone 1/14 > 1/27 vanc x1 1/20 Cefazolin 1/29 > 2/2 cefuroxim 2/2 vanc 2/2 Ceftriaxone x1 2/4  Interim history/subjective:   R Chest tube came out overnight Sweep gas trial yesterday for 30 minutes> pCO2 rose to 87  Objective   Blood pressure (!) 154/64, pulse 92, temperature 98.5 F (36.9 C), temperature source Oral, resp. rate (!) 29, height _0  (1.727 m), weight 131 kg, SpO2 99 %. CVP:  [4 mmHg-6 mmHg] 4 mmHg  Vent Mode: PCV FiO2 (%):  [40 %-60 %] 40 % Set Rate:  [15 bmp-20 bmp] 15 bmp PEEP:  [10 cmH20-15 cmH20] 10 cmH20 Plateau Pressure:  [29 cmH20-42 cmH20] 30 cmH20   Intake/Output Summary (Last 24 hours) at 09/23/2019 0709 Last data filed at 09/23/2019 0700 Gross per 24 hour  Intake 4958.93 ml  Output 4490 ml  Net 468.93 ml   Filed Weights   09/21/19 0715 09/22/19 0645 09/23/19 0600  Weight: 132.1 kg 131 kg 131 kg    Examination:  General:  In bed on vent HENT: NCAT tracheostomy in place PULM: CTA B, vent supported breathing CV: RRR, no mgr GI: BS+, soft, nontender MSK: normal bulk and tone Neuro: sedated on vent  ECMO: VV, cardiohelp, IJ catheter, flow ~3.9Lpm, SvO2 70%  2/13 CXR images personally reviewed> tracheostomy in place, ecmo catheter in place, bilateral airspace disease improved somewhat compared to the January films.  Some  interstitial changes  Resolved Hospital Problem list     Assessment & Plan:  ARDS due to COVID 19, requiring mechanical ventilation and ECMO support > oxygenation has improved significantly Would adjust sweep to target pCO2 around 90 Reduce PEEP this week (changed to 10 today) VAP prevention Trach care per routine Reduce sedation to  let him start breathing on his own  Pneumothoraces Remove left chest tube  Hypernatremia Agree with increased dose of free water, may need to increase more again on 2/16 Metamucil  AKI with azotemia, BUN slwoly coming down Monitor BMET and UOP Replace electrolytes as needed Agree with avoiding CRRT  Nutrition, ileus? > no evidence of that Continue bolus feeding  Blood tinged GI secretions > none on 2/15 monitor  Sedation needs Acute metabolic encephalopathy> medication related Wean dilaudid and valium   Hypertension Clonidine Propranolol  Serratia pneumonia> treated Monitor for fever  Neuro muscular weakness PT consult this week> range of motion   Best practice:  Pain/Anxiety/Delirium protocol (if indicated): keep infusions off. Continue enteral agents. Dexmedetomidine and prn Versed and Dilaudid. Begin to taper enteral sedative.  VAP protocol (if indicated): Bundle in place. Respiratory support goals: Pressure control ventilation with ultra protective lung ventilation. Titrate FiO2 down again. Continue PEEP 16  Blood pressure target:  MAP>65, off pressors DVT prophylaxis: Remains on systemic heparin - target Heparin level 0.3-0.7 Nutrition Status: Nutrition Problem: Increased nutrient needs Etiology: acute illness(COVID PNA) Signs/Symptoms: estimated needs Interventions: Tube feeding, Prostat  Bolus feed. GI prophylaxis: Protonix per tube, change to BID today Fluid status goals: cont lasix infusion Urinary catheter: Guide hemodynamic management Glucose control: Euglycemic control with SSI Mobility/therapy needs: Bedrest.  Daily labs: as per ECMO protocol. Code Status: Full code  Family Communication: I called his wife on 2/13 Disposition: ICU  Labs   CBC: Recent Labs  Lab 09/21/19 1638 09/21/19 1638 09/21/19 2018 09/21/19 2020 09/22/19 0424 09/22/19 0433 09/22/19 1557 09/22/19 1558 09/22/19 2332 09/23/19 0425 09/23/19 0452  WBC 13.4*  --  11.3*   --  9.4  --  13.9*  --   --  12.8*  --   HGB 8.4*   < > 8.1*   < > 8.0*   < > 8.6* 9.5* 8.5* 8.2* 12.2*  HCT 29.5*   < > 28.1*   < > 27.5*   < > 29.7* 28.0* 25.0* 28.7* 36.0*  MCV 95.8  --  96.9  --  95.5  --  95.5  --   --  96.0  --   PLT 169  --  160  --  144*  --  154  --   --  151  --    < > = values in this interval not displayed.    Basic Metabolic Panel: Recent Labs  Lab 09/19/19 0336 09/19/19 0350 09/20/19 0324 09/20/19 0340 09/21/19 0439 09/21/19 0447 09/21/19 1638 09/21/19 2020 09/22/19 0424 09/22/19 0433 09/22/19 1557 09/22/19 1558 09/22/19 2332 09/23/19 0425 09/23/19 0452  NA 145   < > 142   < > 147*   < > 147*   < > 148*   < > 148* 150* 151* 152* 151*  K 3.9   < > 3.8   < > 3.6   < > 4.6   < > 3.8   < > 5.0 5.0 4.8 4.7 4.6  CL 100   < > 97*   < > 99  --  103  --  105  --  109  --   --  112*  --   CO2 34*   < > 34*   < > 36*  --  34*  --  32  --  30  --   --  29  --   GLUCOSE 118*   < > 168*   < > 116*  --  271*  --  181*  --  349*  --   --  235*  --   BUN 114*   < > 118*   < > 134*  --  147*  --  138*  --  139*  --   --  127*  --   CREATININE 1.03   < > 1.07   < > 1.01  --  1.08  --  1.00  --  1.01  --   --  1.02  --   CALCIUM 9.1   < > 9.1   < > 9.1  --  9.1  --  9.0  --  9.0  --   --  9.2  --   MG 2.1  --  2.2  --  2.4  --   --   --  2.5*  --   --   --   --  2.5*  --   PHOS 4.2  --  4.1  --  3.9  --   --   --  3.7  --   --   --   --  3.0  --    < > = values in this interval not displayed.   GFR: Estimated Creatinine Clearance: 120.8 mL/min (by C-G formula based on SCr of 1.02 mg/dL). Recent Labs  Lab 09/20/19 0324 09/20/19 1656 09/21/19 0439 09/21/19 1638 09/21/19 2018 09/22/19 0424 09/22/19 1557 09/23/19 0425  WBC 11.9*   < > 12.2*   < > 11.3* 9.4 13.9* 12.8*  LATICACIDVEN 1.3  --  0.9  --   --  0.8  --  1.1   < > = values in this interval not displayed.    Liver Function Tests: No results for input(s): AST, ALT, ALKPHOS, BILITOT, PROT, ALBUMIN  in the last 168 hours. No results for input(s): LIPASE, AMYLASE in the last 168 hours. No results for input(s): AMMONIA in the last 168 hours.  ABG    Component Value Date/Time   PHART 7.389 09/23/2019 0452   PCO2ART 51.3 (H) 09/23/2019 0452   PO2ART 108.0 09/23/2019 0452   HCO3 31.0 (H) 09/23/2019 0452   TCO2 32 09/23/2019 0452   ACIDBASEDEF 4.0 (H) 09/16/2019 2334   O2SAT 98.0 09/23/2019 0452     Coagulation Profile: No results for input(s): INR, PROTIME in the last 168 hours.  Cardiac Enzymes: No results for input(s): CKTOTAL, CKMB, CKMBINDEX, TROPONINI in the last 168 hours.  HbA1C: Hgb A1c MFr Bld  Date/Time Value Ref Range Status  07/24/2019 04:50 AM 6.7 (H) 4.8 - 5.6 % Final    Comment:    (NOTE) Pre diabetes:          5.7%-6.4% Diabetes:              >6.4% Glycemic control for   <7.0% adults with diabetes   09/30/2017 05:36 AM 5.7 (H) 4.8 - 5.6 % Final    Comment:    (NOTE)         Prediabetes: 5.7 - 6.4         Diabetes: >6.4         Glycemic control for adults with diabetes: <7.0  CBG: Recent Labs  Lab 09/22/19 1123 09/22/19 1556 09/22/19 1954 09/22/19 2329 09/23/19 0433  GLUCAP 258* 303* 303* 349* 224*     Critical care time: 35 minutes     Roselie Awkward, MD [place] [facility] Pager: [phone] Cell: [phone] If no response, call [phone]

## 2019-09-23 NOTE — CV Procedure (Signed)
   ECMO NOTE:  Indication: Respiratory failure due to COVID PNA  Initial cannulation date: 08/03/2019  ECMO type: VV ECMO (Cardio-Help)  Dual lumen  Inflow/return cannula:  1) 32 FR Crescent placed 2/2  ECMO events:  Initial cannulation 12/26 Circuit changed and trach exchanged on 1/8.   Underwent addition of a RIJ drainage cannula on 1/20 Circuit changed on 1/22 for rising LDH Had evidence of recirculation and RIJ cannula pulled back on 1/26 19 FR RIJ removed 09/06/19 Underwent change to [facility] catheter with removal of femoral lines 2/2 Sweep trial 2/14. 30 mins  PCO2 55 -> 75  Daily data:  Remains on VV ecmo with good flows.    Flow 4.90 RPM 3600 dP 24 Pven  -89 Sweep  8 SVO2 77%  Labs:  Hgb 8.2 LDH 350 Heparin level: 0.34 Lactic acid 0.8  Plan:  Crescent cannula working well Excellent flow and sats. Now weaning flow CXR improved Will continue to wean sedation. Repeat Sweep trial today BUN and bicarb rising c/w contraction alkalosis. Continue IVF and increase FW Watch DTIs   Glori Bickers, MD  8:34 AM

## 2019-09-23 NOTE — Progress Notes (Signed)
ANTICOAGULATION CONSULT NOTE - Follow Up Consult  Pharmacy Consult for heparin Indication: ECMO  Labs: Recent Labs    09/22/19 1557 09/22/19 1558 09/23/19 0425 09/23/19 0452 09/23/19 1533 09/23/19 1533 09/23/19 1641 09/23/19 1651  HGB 8.6*   < > 8.2*   < > 9.5*   < > 8.6* 9.5*  HCT 29.7*   < > 28.7*   < > 28.0*  --  30.4* 28.0*  PLT 154  --  151  --   --   --  155  --   APTT 65*  --  69*  --   --   --  67*  --   HEPARINUNFRC 0.39  --  0.34  --   --   --  0.32  --   CREATININE 1.01  --  1.02  --   --   --  0.94  --    < > = values in this interval not displayed.    Assessment: 44yo male on VV- ECMO.  Heparin level came back thearpeutic at 0.32, on 1700 units/hr. Hgb 9.5, plt 155  no bleeding noted.    Goal of Therapy:  Heparin level 0.3-0.5 units/ml (aiming for lowest end)   Plan:  -Continue heparin infusion at 1700 units/h -Monitor HL q12, CBC, LDH, fibrinogen, and s/sx of bleeding   Alanda Slim, PharmD, [facility] Clinical Pharmacist Please see AMION for all Pharmacists' Contact Phone Numbers 09/23/2019, 5:48 PM

## 2019-09-23 NOTE — Progress Notes (Addendum)
Nutrition Follow up  DOCUMENTATION CODES:   Obesity unspecified  INTERVENTION:   Transition to bolus feedings:  -1 can Osmolite 1.5 six times daily (0000, 0400, 0800, 1200, 1600, 2000) -60 ml Prostat QID -Juven BID -Free water flushes 400 ml Q6 hours- per CCM  Provides: 3090 kcals, 214 grams protein, 1086 ml free water (2686 ml with flushes). Meets 100% of needs.   NUTRITION DIAGNOSIS:   Increased nutrient needs related to acute illness(COVID PNA) as evidenced by estimated needs.  Ongoing  GOAL:   Patient will meet greater than or equal to 90% of their needs   Addressed via TF  MONITOR:   Diet advancement, Skin, TF tolerance, Weight trends, Labs, I & O's  REASON FOR ASSESSMENT:   Consult Enteral/tube feeding initiation and management  ASSESSMENT:   Patient with PMH significant for GERD, HTN, and asthma. Presents this admission with COVID-19 PNA.   12/14- admit 12/23- chest xray revealed pneumomediastinum  12/24- developed bilateral pneumothoraces  12/26- worsening hypoxemia, intubated  12/27- s/p VV fem/fem ECMO cannulation  12/29- s/p trach  1/5- nasal bleeding- packing per CCM 1/7- nasal packing per ENT 1/8- trach exchanged, ECMO circuit changed  1/20- s/p additional RIJ drainage cannula  1/21- Cortrak kinked, replaced by OGT, pt vomited over night 1/22- ECMO circuit exchanged  1/23- nasal endoscopy, control of epistaxis  2/2- TEE, Cortrak removed, change to crescent catheter, removal of femoral lines 2/3- s/p PEG, EGD  Pt discussed during ICU rounds and with RN.   VV ECMO day 51. R chest tube out over night. Tolerated sweep gas for 30 minutes. Off sedation. BUN improving-no plans for CRRT. Remains hypernatremic. Plan for additional increase in FWF per CCM (6.2 L free water deficit).   Tolerating 1 carton of Osmolite 1.5 bolus feeding six times daily with Prostat. Would not recommend changing back to continuous as pt has done well with bolus feeding and  it mimics meal time feedings. Schedule adjusted per insulin regimen recommended by diabetes coordination. Suspect D5 may contribute to elevated CBGs.   Admission weight: 136.1 kg Current weight: 131 kg (down 10 kg over the last week)  Patient remains on ventilator support via trach MV: 11.6 L/min Temp (24hrs), Avg:98.8 F (37.1 C), Min:98.5 F (36.9 C), Max:99.3 F (37.4 C)   I/O: -11,609 ml since 2/1 UOP: 4,475 ml x 24 hrs Chest tubes: 15 ml x 24 hrs   Drips: D5 @ 75 ml/hr  Medications: 500 mg Vitamin C daily, SS novolog, metamucil Labs: Na 151 (H) BUN 127-trending down CBG 116-301  Diet Order:   Diet Order    None      EDUCATION NEEDS:   Not appropriate for education at this time  Skin:  Skin Assessment: Skin Integrity Issues: Skin Integrity Issues:: DTI, Stage II, Stage III DTI: bilateral heels Stage II: buttocks Stage III: neck from trach ties Incisions: L wrist, nose Other: n/a  Last BM:  2/15 via rectal tube  Height:   Ht Readings from Last 1 Encounters:  08/09/19 5' 8"  (1.727 m)    Weight:   Wt Readings from Last 1 Encounters:  09/23/19 131 kg    Ideal Body Weight:  70 kg  BMI:  Body mass index is 43.91 kg/m.  Estimated Nutritional Needs:   Kcal:  6789-3810 kcal (25-30 kcal/kg)  Protein:  180-215 grams  Fluid:  >/= 2.5 L/day   [name] RD, LDN Clinical Nutrition Pager # - [phone]

## 2019-09-23 NOTE — Progress Notes (Addendum)
Pharmacy Antibiotic Note  [name] is a 44 y.o. male admitted on 07/22/2019 with pneumonia.  Pharmacy has been consulted for vancomycin and meropenem dosing.  WBC today 12.8, afebrile. Bronch completed at bedside showing purulent fluid. Scr 1.02.    Plan: Meropenem 1 g IV every 8 hours Vancomycin 2.5 g IV once then 1 g IV every 8 hours   Monitor renal fx, cx results, clinical pic, and levels as appropriate   Height: _0  (172.7 cm) Weight: 288 lb 12.8 oz (131 kg) IBW/kg (Calculated) : 68.4  Temp (24hrs), Avg:98.8 F (37.1 C), Min:98.5 F (36.9 C), Max:99.3 F (37.4 C)  Recent Labs  Lab 09/19/19 0336 09/19/19 1608 09/20/19 0324 09/20/19 1656 09/21/19 0439 09/21/19 0439 09/21/19 1638 09/21/19 2018 09/22/19 0424 09/22/19 1557 09/23/19 0425  WBC 10.3   < > 11.9*   < > 12.2*   < > 13.4* 11.3* 9.4 13.9* 12.8*  CREATININE 1.03   < > 1.07   < > 1.01  --  1.08  --  1.00 1.01 1.02  LATICACIDVEN 1.3  --  1.3  --  0.9  --   --   --  0.8  --  1.1   < > = values in this interval not displayed.    Estimated Creatinine Clearance: 120.8 mL/min (by C-G formula based on SCr of 1.02 mg/dL).    No Known Allergies  Antimicrobials this admission: Remdesivir 12/14>>12/23 Actemra x1 12/16; 12/22 Plasma 12/17 Vanco 12/26 >>12/28, 1/4>>1/13, 2/15>> Cefepime 12/26 >>12/30, 1/4>>1/13 Meropenem 2/15>> Ctx 1/15>> (nose packing)1/28 restarted for 1 dose when changed foley 2/5 Ancef nasal packing 1/28>2/3  Dose adjustments this admission: N/A  Microbiology results: 12/14 BCx: ngF 12/14 sputum: Normal respiratory flora  12/25 MRSA PCR neg  12/28 trach: few MSSA 12/28 BCx: ngF 1/2 bronch: >100 k serratia marcescens (R cefazolin), >100 k staph epidermidis (S vanc, tetra) 1/4 BAL: MSSA, serratia marcescens (R cefazolin) 1/9 sputum: serratia/staph 1/9 bldx2 -ng 1/11 BAL: serratia 1/12 BAL: serratia 1/18 BAL: serratia - S cefepime 1/24 BCx x 2: ngF 1/30 Bcx: ngF 1/30 TA: mod  serratia (R cefazolin and CTX) 2/1 BAL: 30k serratia (R- cefazolin), 10k enterococcus 2/2 Sputum: few GNR, rare GPRs 2/2 Bcx x2: ngF 2/2 UCx: ngF 2/5 UCx: ngF Thank you for allowing pharmacy to be a part of this patient's care.  [name] [name] 09/23/2019 2:06 PM

## 2019-09-23 NOTE — Procedures (Signed)
Bronchoscopy Procedure Note [name] [id number] Jan 08, 1976  Procedure: Bronchoscopy Indications: Diagnostic evaluation of the airways  Procedure Details Consent: Risks of procedure as well as the alternatives and risks of each were explained to the (patient/caregiver).  Consent for procedure obtained. Time Out: Verified patient identification, verified procedure, site/side was marked, verified correct patient position, special equipment/implants available, medications/allergies/relevent history reviewed, required imaging and test results available.  Performed  In preparation for procedure, patient was given 100% FiO2. Sedation: already sedated on ventilator  Airway entered and the following bronchi were examined: RUL, RML, RLL, LUL and LLL.   Procedures performed: Brushings performed Bronchoscope removed.    Evaluation Hemodynamic Status: BP stable throughout; O2 sats: stable throughout Patient's Current Condition: stable Specimens:  Sent purulent fluid Complications: No apparent complications Patient did tolerate procedure well.   Wonda Olds 09/23/2019

## 2019-09-23 NOTE — Progress Notes (Signed)
Inpatient Diabetes Program Recommendations  AACE/ADA: New Consensus Statement on Inpatient Glycemic Control (2015)  Target Ranges:  Prepandial:   less than 140 mg/dL      Peak postprandial:   less than 180 mg/dL (1-2 hours)      Critically ill patients:  140 - 180 mg/dL   Results for [name], [name] (MRN [id number]) as of 09/23/2019 07:14  Ref. Range 09/21/2019 23:13 09/22/2019 03:22 09/22/2019 07:17 09/22/2019 11:23 09/22/2019 15:56 09/22/2019 19:54  Glucose-Capillary Latest Ref Range: 70 - 99 mg/dL 241 (H)  7 units NOVOLOG  176 (H)  4 units NOVOLOG  182 (H)  4 units NOVOLOG  258 (H)  11 units NOVOLOG  303 (H)  11 units NOVOLOG  303 (H)  15 units NOVOLOG    Results for [name], [name] (MRN [id number]) as of 09/23/2019 07:14  Ref. Range 09/22/2019 23:29 09/23/2019 04:33  Glucose-Capillary Latest Ref Range: 70 - 99 mg/dL 349 (H)  15 units NOVOLOG  224 (H)  7 units NOVOLOG      Current Orders: Novolog Resistant Correction Scale/ SSI (0-20 units) Q4 hours    COVID Pneumonia--ARDS.  Remains on Vent.  Getting Tube Feeds (Osmolite) 237 ml 6 times daily.    MD- Please consider the following to help with glucose elevations:  1. Change Tube Feeding bolus times to match with Novolog SSI Q4 hours 12am, 4am, 8am, 12pm, 4pm, 8pm  2. Start Novolog tube feed bolus coverage: Novolog 6 units Q4 hours  12am, 4am, 8am, 12pm, 4pm, 8pm  HOLD if tube feed boluses HELD for any reason      --Will follow patient during hospitalization--  Wyn Quaker RN, MSN, CDE Diabetes Coordinator Inpatient Glycemic Control Team Team Pager: [phone] (8a-5p)

## 2019-09-23 NOTE — Progress Notes (Signed)
Patient ID: [name], male   DOB: 08/27/1975, 44 y.o.   MRN: [id number]    Advanced Heart Failure Rounding Note   Subjective:    Remains on VV ecmo.  Had 30 min sweep trial last night. Now off Precedex. Will arouse but not reliably following commands. We are weaning sedation   Lasix off and now on IVF. BUN coming down   CVP 6  Sweep 8  On vent at 40% TVs > 300-400cc  CXR with diffuse bilateral infiltrates (?) slightly improved today. Crescent catheter well positioned Personally reviewed   ABG 7.39/51/108/98   hgb 8.2  Sodium 146 -> 152. BUN 138 -> 127 Cr 1.02  ECMO parameters - see separate ecmo note   Objective:   Weight Range:  Vital Signs:   Temp:  [98.4 F (36.9 C)-99.3 F (37.4 C)] 98.6 F (37 C) (02/15 0800) Pulse Rate:  [83-98] 94 (02/15 0800) Resp:  [19-34] 28 (02/15 0800) BP: (154)/(64) 154/64 (02/15 0012) SpO2:  [96 %-100 %] 98 % (02/15 0800) Arterial Line BP: (126-161)/(50-68) 145/61 (02/15 0800) FiO2 (%):  [40 %-60 %] 40 % (02/15 0404) Weight:  [131 kg] 131 kg (02/15 0600) Last BM Date: 09/23/19  Weight change: Filed Weights   09/21/19 0715 09/22/19 0645 09/23/19 0600  Weight: 132.1 kg 131 kg 131 kg    Intake/Output:   Intake/Output Summary (Last 24 hours) at 09/23/2019 0806 Last data filed at 09/23/2019 0800 Gross per 24 hour  Intake 4954.12 ml  Output 4440 ml  Net 514.12 ml     Physical Exam: General: Sedated on vent Will arouse to voice HEENT: normal Neck: supple + trach. no JVD. Carotids 2+ bilat; no bruits. No lymphadenopathy or thryomegaly appreciated. Cor: PMI nondisplaced. Regular rate & rhythm. No rubs, gallops or murmurs. Lungs: decreased air movement  Abdomen: Obese soft, nontender, nondistended. No hepatosplenomegaly. No bruits or masses. Good bowel sounds. Extremities: no cyanosis, clubbing, rash, 1+ edema DTIs on both feet Neuro: Sedated on vent Will arouse to voice    Telemetry: Sinus 80-90s Personally  reviewed   Labs: Basic Metabolic Panel: Recent Labs  Lab 09/19/19 0336 09/19/19 0350 09/20/19 0324 09/20/19 0340 09/21/19 0439 09/21/19 0447 09/21/19 1638 09/21/19 2020 09/22/19 0424 09/22/19 0433 09/22/19 1557 09/22/19 1558 09/22/19 2332 09/23/19 0425 09/23/19 0452  NA 145   < > 142   < > 147*   < > 147*   < > 148*   < > 148* 150* 151* 152* 151*  K 3.9   < > 3.8   < > 3.6   < > 4.6   < > 3.8   < > 5.0 5.0 4.8 4.7 4.6  CL 100   < > 97*   < > 99  --  103  --  105  --  109  --   --  112*  --   CO2 34*   < > 34*   < > 36*  --  34*  --  32  --  30  --   --  29  --   GLUCOSE 118*   < > 168*   < > 116*  --  271*  --  181*  --  349*  --   --  235*  --   BUN 114*   < > 118*   < > 134*  --  147*  --  138*  --  139*  --   --  127*  --  CREATININE 1.03   < > 1.07   < > 1.01  --  1.08  --  1.00  --  1.01  --   --  1.02  --   CALCIUM 9.1   < > 9.1   < > 9.1   < > 9.1   < > 9.0  --  9.0  --   --  9.2  --   MG 2.1  --  2.2  --  2.4  --   --   --  2.5*  --   --   --   --  2.5*  --   PHOS 4.2  --  4.1  --  3.9  --   --   --  3.7  --   --   --   --  3.0  --    < > = values in this interval not displayed.    Liver Function Tests: No results for input(s): AST, ALT, ALKPHOS, BILITOT, PROT, ALBUMIN in the last 168 hours. No results for input(s): LIPASE, AMYLASE in the last 168 hours. No results for input(s): AMMONIA in the last 168 hours.  CBC: Recent Labs  Lab 09/21/19 1638 09/21/19 1638 09/21/19 2018 09/21/19 2020 09/22/19 0424 09/22/19 0433 09/22/19 1557 09/22/19 1558 09/22/19 2332 09/23/19 0425 09/23/19 0452  WBC 13.4*  --  11.3*  --  9.4  --  13.9*  --   --  12.8*  --   HGB 8.4*   < > 8.1*   < > 8.0*   < > 8.6* 9.5* 8.5* 8.2* 12.2*  HCT 29.5*   < > 28.1*   < > 27.5*   < > 29.7* 28.0* 25.0* 28.7* 36.0*  MCV 95.8  --  96.9  --  95.5  --  95.5  --   --  96.0  --   PLT 169  --  160  --  144*  --  154  --   --  151  --    < > = values in this interval not displayed.     Cardiac Enzymes: No results for input(s): CKTOTAL, CKMB, CKMBINDEX, TROPONINI in the last 168 hours.  BNP: BNP (last 3 results) Recent Labs    07/22/19 1039  BNP 15.3    ProBNP (last 3 results) No results for input(s): PROBNP in the last 8760 hours.    Other results:  Imaging: DG CHEST PORT 1 VIEW  Result Date: 09/23/2019 CLINICAL DATA:  Chest tube removal. EXAM: PORTABLE CHEST 1 VIEW COMPARISON:  Radiograph yesterday. FINDINGS: Previous left chest tube has been removed. Right chest tube remains in place. No visualized pneumothorax. Tracheostomy tube tip at the thoracic inlet. Right internal jugular ECMO catheter with tip extending into the upper abdomen below the field of view. Right upper extremity PICC in place. Patchy bilateral airspace opacities with slight improvement on the left from prior exam. Unchanged heart size and mediastinal contours. IMPRESSION: 1. Removal of left chest tube. No visualized pneumothorax. 2. Additional support apparatus unchanged. 3. Patchy bilateral airspace opacities with slight improvement on the left from prior exam. Electronically Signed   By: Keith Rake M.D.   On: 09/23/2019 06:10   DG CHEST PORT 1 VIEW  Result Date: 09/22/2019 CLINICAL DATA:  Currently on ECMO EXAM: PORTABLE CHEST 1 VIEW COMPARISON:  09/21/2019 FINDINGS: Cardiac shadow remains enlarged. Tracheostomy tube and ECMO cannula are again noted and stable. Bilateral chest tubes are again seen and stable. The overall inspiratory effort  is poor. Patchy opacities are again seen in both lungs stable from the prior study. No new focal abnormality is noted. IMPRESSION: Stable appearance of the chest when compared with the previous day. Electronically Signed   By: Inez Catalina M.D.   On: 09/22/2019 09:18   DG CHEST PORT 1 VIEW  Result Date: 09/21/2019 CLINICAL DATA:  COVID positive. Encounter for ECMO EXAM: PORTABLE CHEST 1 VIEW COMPARISON:  Chest x-rays dated 09/20/2019 in 09/19/2019.  FINDINGS: Stable cardiomegaly. ECMO cannula again seen over the RIGHT hemithorax. Tracheostomy tube remains appropriately positioned in the midline. Bilateral chest tubes are stable in position. Bilateral patchy interstitial and alveolar airspace opacities appear stable. No new lung findings. No pleural effusion or pneumothorax is seen. IMPRESSION: 1. Stable chest x-ray. Bilateral interstitial and alveolar airspace opacities are unchanged, compatible with multifocal pneumonia versus pulmonary edema. 2. Stable cardiomegaly. Electronically Signed   By: Franki Cabot M.D.   On: 09/21/2019 10:21     Medications:     Scheduled Medications: . amiodarone  200 mg Oral Daily  . vitamin C  500 mg Per Tube Daily  . chlorhexidine gluconate (MEDLINE KIT)  15 mL Mouth Rinse BID  . Chlorhexidine Gluconate Cloth  6 each Topical Daily  . cloNIDine  0.1 mg Oral Q6H  . diazepam  10 mg Per Tube Q12H  . feeding supplement (OSMOLITE 1.5 CAL)  237 mL Per Tube 6 X Daily  . feeding supplement (PRO-STAT SUGAR FREE 64)  60 mL Per Tube QID  . free water  400 mL Per Tube Q6H  . gabapentin  200 mg Per Tube Q8H  . HYDROmorphone  8 mg Oral Q6H   Followed by  . [START ON 09/25/2019] HYDROmorphone  4 mg Oral Q6H   Followed by  . [START ON 09/28/2019] HYDROmorphone  2 mg Oral Q6H  . insulin aspart  0-20 Units Subcutaneous Q4H  . insulin aspart  4 Units Subcutaneous Q4H  . ipratropium-albuterol  3 mL Nebulization Q4H  . mouth rinse  15 mL Mouth Rinse 10 times per day  . montelukast  10 mg Per Tube QHS  . nutrition supplement (JUVEN)  1 packet Per Tube BID BM  . pantoprazole sodium  40 mg Per Tube BID  . propranolol  40 mg Per Tube TID  . psyllium  1 packet Oral BID  . QUEtiapine  50 mg Per Tube QHS  . sodium chloride flush  10-40 mL Intracatheter Q12H  . sodium chloride flush  10-40 mL Intracatheter Q12H  . white petrolatum   Topical BID    Infusions: . sodium chloride    . sodium chloride Stopped (09/05/19 1900)   . sodium chloride 10 mL/hr at 09/21/19 1716  . dexmedetomidine (PRECEDEX) IV infusion Stopped (09/22/19 0733)  . dextrose 5 % and 0.45% NaCl 75 mL/hr at 09/23/19 0800  . heparin 1,700 Units/hr (09/23/19 0800)    PRN Medications: Place/Maintain arterial line **AND** sodium chloride, sodium chloride, sodium chloride, acetaminophen (TYLENOL) oral liquid 160 mg/5 mL, acetaminophen, hydrALAZINE, [COMPLETED] HYDROmorphone **FOLLOWED BY** HYDROmorphone **FOLLOWED BY** [START ON 09/25/2019] HYDROmorphone **FOLLOWED BY** [START ON 09/28/2019] HYDROmorphone **FOLLOWED BY** [START ON 10/01/2019] HYDROmorphone, magic mouthwash, magnesium hydroxide, ondansetron (ZOFRAN) IV, senna-docusate, sodium chloride, sodium chloride flush   Assessment/Plan:    1. Acute hypoxic/hypercarbic respiratory failure due to COVID PNA: Remained markedly hypoxic despite full support. Intubated 12/26. S/p bilateral CTs 12/26 for pneumothoraces.  TEE on 12/26 LVEF 70% RV ok. VV ECMO begun 12/26. Tracheostomy 12/29.  COVID  ARDS at this point. ECMO circuit changed 1/8 & 1/23. Crescent cannula placed 2/2 (femoral cannuals removed) - ECMO parameters as per ECMO note - Flows look good on Crescent - Did well with Sweep trial yesterday. Will repeat today - Continue to wean sedation  - D/w CCM would be ok with permissive hypercapnea if he can maintain pH. Stop diamox to faciltate metabolic compensation - Volume status stable. Continue IVF to help with azotemia. Increase FW with hypernatremia  - LDH 350 - Continue heparin. Discussed dosing with PharmD personally. - Continue chest PT - Continue regular episodes of awakening and letting him work interspersed with sedation and rest periods.  2. Bilateral PTX: Due to barotrauma.  - Resolved. Remove chest tubes  3. COVID PNA: management of COVID infection per CCM. CXR with bilateral multifocal PNA progressing to likely ARDS.  - s/p 10 days of remdesivir - 12/16, 12/2 s/p tociluzimab -  12/17 s/p convalescent plasma - Decadron at 30m/daily completed 1/11 - Completed cefepime/vanc - Off Ancef  4. ID: Empiric coverage with vancomycin/cefepime initially for possible secondary bacterial PNA, course completed.   - treated with cefepime and vanc for + cx with staph epidimidis, MSSA and serratia (sensitivities ok). - Currently off abx - BAL cx. With serratia and enterococcus felt to be colonization - No fever WBC stable  - Continue to follow  5. Anemia: - hgb 10.5 -> 8.2  today. Transfuse as needed to keep Hgb> 8.0 - discussed with Pulmonary for possible bronch  7. FEN - now with G-tube.Continue TFs  8. Hypernatremia - NA 155 -> 143 -> 145 -> 142 -> 146 -> 150 -> 152 - Increase FW and continue IVF  9. AKI - Volume status much improved with lasix gtt. BUN continues to climb. Lasix off. Now on IVF. Increase FW -  Possibility of CVVHD d/w Renal. No current indication for HD. D/w Renal. Would consider CVVHD once sedation weaned and felt to be uremic  10. DTIs - WOC following.   D/w with ECMO team (ECMO coordinator/specialists, CCM, TCTS and pharmD) at bedside on multidisciplinary rounds. Continue to await ARDS recovery. Details as above.   Length of Stay: 658 CRITICAL CARE Performed by: BGlori Bickers Total critical care time: 35  minutes  Critical care time was exclusive of separately billable procedures and treating other patients.  Critical care was necessary to treat or prevent imminent or life-threatening deterioration.  Critical care was time spent personally by me (independent of midlevel providers or residents) on the following activities: development of treatment plan with patient and/or surrogate as well as nursing, discussions with consultants, evaluation of patient's response to treatment, examination of patient, obtaining history from patient or surrogate, ordering and performing treatments and interventions, ordering and review of laboratory  studies, ordering and review of radiographic studies, pulse oximetry and re-evaluation of patient's condition.    DGlori BickersMD 09/23/2019, 8:06 AM  Advanced Heart Failure Team Pager [phone](M-F; 7a - 4p)  Please contact [facility] for night-coverage after hours (4p -7a ) and weekends on [email or web address]

## 2019-09-23 NOTE — Progress Notes (Signed)
ECMO Specialist goal for the shift is establish and maintain good ventilation and oxygenation via utilization of the ECMO pump using appropriate settings and flows to meet patient systemic and myocardial oxygen demands, to assure adequate tissue/organ perfusion and oxygenation.   Anthoney Sheppard L. Tamala Julian, BS, RRT, RCP

## 2019-09-23 NOTE — Progress Notes (Signed)
ANTICOAGULATION CONSULT NOTE - Follow Up Consult  Pharmacy Consult for heparin Indication: ECMO  Labs: Recent Labs    09/22/19 0424 09/22/19 0433 09/22/19 1557 09/22/19 1558 09/22/19 2332 09/22/19 2332 09/23/19 0425 09/23/19 0452  HGB 8.0*   < > 8.6*   < > 8.5*   < > 8.2* 12.2*  HCT 27.5*   < > 29.7*   < > 25.0*  --  28.7* 36.0*  PLT 144*  --  154  --   --   --  151  --   APTT 94*  --  65*  --   --   --  69*  --   HEPARINUNFRC 0.50  --  0.39  --   --   --  0.34  --   CREATININE 1.00  --  1.01  --   --   --  1.02  --    < > = values in this interval not displayed.    Assessment: 44yo male on VV- ECMO.  Heparin level came back thearpeutic at 0.34, on 1700 units/hr. Hgb 12.2, plt 151 - s/p 2 PRBCs on 2/14. Fibrinogen is stable at 544. LDH at 350. Had some bleeding at trach site yesterday evening -stable now, no other bleeding noted.    Goal of Therapy:  Heparin level 0.3-0.5 units/ml (aiming for lowest end)   Plan:  -Continue heparin infusion at 1700 units/h -Monitor HL q12, CBC, LDH, fibrinogen, and s/sx of bleeding   Antonietta Jewel, PharmD, [place] Clinical Pharmacist  Phone: [phone]  Please check AMION for all [place] phone numbers After 10:00 PM, call [place] [phone] 09/23/2019

## 2019-09-24 ENCOUNTER — Inpatient Hospital Stay (HOSPITAL_COMMUNITY): Payer: Managed Care, Other (non HMO)

## 2019-09-24 LAB — CBC
HCT: 26.1 % — ABNORMAL LOW (ref 39.0–52.0)
HCT: 24.8 % — ABNORMAL LOW (ref 39.0–52.0)
Hemoglobin: 7.4 g/dL — ABNORMAL LOW (ref 13.0–17.0)
Hemoglobin: 7.1 g/dL — ABNORMAL LOW (ref 13.0–17.0)
MCH: 27.4 pg (ref 26.0–34.0)
MCH: 27.7 pg (ref 26.0–34.0)
MCHC: 28.4 g/dL — ABNORMAL LOW (ref 30.0–36.0)
MCHC: 28.6 g/dL — ABNORMAL LOW (ref 30.0–36.0)
MCV: 96.7 fL (ref 80.0–100.0)
MCV: 96.9 fL (ref 80.0–100.0)
Platelets: 125 10*3/uL — ABNORMAL LOW (ref 150–400)
Platelets: 114 10*3/uL — ABNORMAL LOW (ref 150–400)
RBC: 2.7 MIL/uL — ABNORMAL LOW (ref 4.22–5.81)
RBC: 2.56 MIL/uL — ABNORMAL LOW (ref 4.22–5.81)
RDW: 17.8 % — ABNORMAL HIGH (ref 11.5–15.5)
RDW: 18 % — ABNORMAL HIGH (ref 11.5–15.5)
WBC: 21.4 10*3/uL — ABNORMAL HIGH (ref 4.0–10.5)
WBC: 17.4 10*3/uL — ABNORMAL HIGH (ref 4.0–10.5)
nRBC: 0 % (ref 0.0–0.2)
nRBC: 0.2 % (ref 0.0–0.2)

## 2019-09-24 LAB — TYPE AND SCREEN
ABO/RH(D): O POS
Antibody Screen: NEGATIVE
Unit division: 0
Unit division: 0
Unit division: 0
Unit division: 0
Unit division: 0

## 2019-09-24 LAB — BPAM RBC
Blood Product Expiration Date: 202103142359
Blood Product Expiration Date: 202103152359
Blood Product Expiration Date: 202103152359
Blood Product Expiration Date: 202103152359
Blood Product Expiration Date: 202103152359
ISSUE DATE / TIME: 202102132340
ISSUE DATE / TIME: 202102140757
Unit Type and Rh: 5100
Unit Type and Rh: 5100
Unit Type and Rh: 5100
Unit Type and Rh: 5100
Unit Type and Rh: 5100

## 2019-09-24 LAB — BASIC METABOLIC PANEL
Anion gap: 11 (ref 5–15)
Anion gap: 9 (ref 5–15)
BUN: 122 mg/dL — ABNORMAL HIGH (ref 6–20)
BUN: 114 mg/dL — ABNORMAL HIGH (ref 6–20)
CO2: 27 mmol/L (ref 22–32)
CO2: 27 mmol/L (ref 22–32)
Calcium: 9.1 mg/dL (ref 8.9–10.3)
Calcium: 9.3 mg/dL (ref 8.9–10.3)
Chloride: 108 mmol/L (ref 98–111)
Chloride: 112 mmol/L — ABNORMAL HIGH (ref 98–111)
Creatinine, Ser: 0.97 mg/dL (ref 0.61–1.24)
Creatinine, Ser: 0.9 mg/dL (ref 0.61–1.24)
GFR calc Af Amer: >60 mL/min (ref >60)
GFR calc Af Amer: >60 mL/min (ref >60)
GFR calc non Af Amer: >60 mL/min (ref >60)
GFR calc non Af Amer: >60 mL/min (ref >60)
Glucose, Bld: 317 mg/dL — ABNORMAL HIGH (ref 70–99)
Glucose, Bld: 336 mg/dL — ABNORMAL HIGH (ref 70–99)
Potassium: 5 mmol/L (ref 3.5–5.1)
Potassium: 4.6 mmol/L (ref 3.5–5.1)
Sodium: 146 mmol/L — ABNORMAL HIGH (ref 135–145)
Sodium: 148 mmol/L — ABNORMAL HIGH (ref 135–145)

## 2019-09-24 LAB — POCT I-STAT 7, (LYTES, BLD GAS, ICA,H+H)
Acid-Base Excess: 2 mmol/L (ref 0.0–2.0)
Acid-Base Excess: 2 mmol/L (ref 0.0–2.0)
Acid-Base Excess: 3 mmol/L — ABNORMAL HIGH (ref 0.0–2.0)
Acid-Base Excess: 4 mmol/L — ABNORMAL HIGH (ref 0.0–2.0)
Acid-Base Excess: 3 mmol/L — ABNORMAL HIGH (ref 0.0–2.0)
Acid-Base Excess: 3 mmol/L — ABNORMAL HIGH (ref 0.0–2.0)
Bicarbonate: 27 mmol/L (ref 20.0–28.0)
Bicarbonate: 27.9 mmol/L (ref 20.0–28.0)
Bicarbonate: 28.5 mmol/L — ABNORMAL HIGH (ref 20.0–28.0)
Bicarbonate: 29.1 mmol/L — ABNORMAL HIGH (ref 20.0–28.0)
Bicarbonate: 28.7 mmol/L — ABNORMAL HIGH (ref 20.0–28.0)
Bicarbonate: 29 mmol/L — ABNORMAL HIGH (ref 20.0–28.0)
Calcium, Ion: 1.3 mmol/L (ref 1.15–1.40)
Calcium, Ion: 1.32 mmol/L (ref 1.15–1.40)
Calcium, Ion: 1.32 mmol/L (ref 1.15–1.40)
Calcium, Ion: 1.32 mmol/L (ref 1.15–1.40)
Calcium, Ion: 1.36 mmol/L (ref 1.15–1.40)
Calcium, Ion: 1.36 mmol/L (ref 1.15–1.40)
HCT: 23 % — ABNORMAL LOW (ref 39.0–52.0)
HCT: 22 % — ABNORMAL LOW (ref 39.0–52.0)
HCT: 25 % — ABNORMAL LOW (ref 39.0–52.0)
HCT: 27 % — ABNORMAL LOW (ref 39.0–52.0)
HCT: 25 % — ABNORMAL LOW (ref 39.0–52.0)
HCT: 22 % — ABNORMAL LOW (ref 39.0–52.0)
Hemoglobin: 7.8 g/dL — ABNORMAL LOW (ref 13.0–17.0)
Hemoglobin: 7.5 g/dL — ABNORMAL LOW (ref 13.0–17.0)
Hemoglobin: 8.5 g/dL — ABNORMAL LOW (ref 13.0–17.0)
Hemoglobin: 9.2 g/dL — ABNORMAL LOW (ref 13.0–17.0)
Hemoglobin: 8.5 g/dL — ABNORMAL LOW (ref 13.0–17.0)
Hemoglobin: 7.5 g/dL — ABNORMAL LOW (ref 13.0–17.0)
O2 Saturation: 94 %
O2 Saturation: 96 %
O2 Saturation: 96 %
O2 Saturation: 96 %
O2 Saturation: 95 %
O2 Saturation: 92 %
Patient temperature: 98.6
Patient temperature: 87.8
Patient temperature: 98.8
Patient temperature: 98.7
Patient temperature: 98.9
Patient temperature: 36.9
Potassium: 5 mmol/L (ref 3.5–5.1)
Potassium: 4.8 mmol/L (ref 3.5–5.1)
Potassium: 4.8 mmol/L (ref 3.5–5.1)
Potassium: 4.6 mmol/L (ref 3.5–5.1)
Potassium: 4.5 mmol/L (ref 3.5–5.1)
Potassium: 4.4 mmol/L (ref 3.5–5.1)
Sodium: 149 mmol/L — ABNORMAL HIGH (ref 135–145)
Sodium: 148 mmol/L — ABNORMAL HIGH (ref 135–145)
Sodium: 149 mmol/L — ABNORMAL HIGH (ref 135–145)
Sodium: 150 mmol/L — ABNORMAL HIGH (ref 135–145)
Sodium: 150 mmol/L — ABNORMAL HIGH (ref 135–145)
Sodium: 148 mmol/L — ABNORMAL HIGH (ref 135–145)
TCO2: 28 mmol/L (ref 22–32)
TCO2: 29 mmol/L (ref 22–32)
TCO2: 30 mmol/L (ref 22–32)
TCO2: 30 mmol/L (ref 22–32)
TCO2: 30 mmol/L (ref 22–32)
TCO2: 30 mmol/L (ref 22–32)
pCO2 arterial: 42.2 mmHg (ref 32.0–48.0)
pCO2 arterial: 37.2 mmHg (ref 32.0–48.0)
pCO2 arterial: 46.2 mmHg (ref 32.0–48.0)
pCO2 arterial: 46.2 mmHg (ref 32.0–48.0)
pCO2 arterial: 49.8 mmHg — ABNORMAL HIGH (ref 32.0–48.0)
pCO2 arterial: 48.4 mmHg — ABNORMAL HIGH (ref 32.0–48.0)
pH, Arterial: 7.414 (ref 7.350–7.450)
pH, Arterial: 7.457 — ABNORMAL HIGH (ref 7.350–7.450)
pH, Arterial: 7.399 (ref 7.350–7.450)
pH, Arterial: 7.408 (ref 7.350–7.450)
pH, Arterial: 7.369 (ref 7.350–7.450)
pH, Arterial: 7.384 (ref 7.350–7.450)
pO2, Arterial: 70 mmHg — ABNORMAL LOW (ref 83.0–108.0)
pO2, Arterial: 55 mmHg — ABNORMAL LOW (ref 83.0–108.0)
pO2, Arterial: 81 mmHg — ABNORMAL LOW (ref 83.0–108.0)
pO2, Arterial: 80 mmHg — ABNORMAL LOW (ref 83.0–108.0)
pO2, Arterial: 79 mmHg — ABNORMAL LOW (ref 83.0–108.0)
pO2, Arterial: 64 mmHg — ABNORMAL LOW (ref 83.0–108.0)

## 2019-09-24 LAB — GLUCOSE, CAPILLARY
Glucose-Capillary: 284 mg/dL — ABNORMAL HIGH (ref 70–99)
Glucose-Capillary: 277 mg/dL — ABNORMAL HIGH (ref 70–99)
Glucose-Capillary: 328 mg/dL — ABNORMAL HIGH (ref 70–99)
Glucose-Capillary: 279 mg/dL — ABNORMAL HIGH (ref 70–99)
Glucose-Capillary: 289 mg/dL — ABNORMAL HIGH (ref 70–99)
Glucose-Capillary: 273 mg/dL — ABNORMAL HIGH (ref 70–99)

## 2019-09-24 LAB — ACID FAST CULTURE WITH REFLEXED SENSITIVITIES (MYCOBACTERIA): Acid Fast Culture: NEGATIVE

## 2019-09-24 LAB — COOXEMETRY PANEL
Carboxyhemoglobin: 3.1 % — ABNORMAL HIGH (ref 0.5–1.5)
Methemoglobin: 1.4 % (ref 0.0–1.5)
O2 Saturation: 76.4 %
Total hemoglobin: 7.5 g/dL — ABNORMAL LOW (ref 12.0–16.0)

## 2019-09-24 LAB — PHOSPHORUS: Phosphorus: 3.4 mg/dL (ref 2.5–4.6)

## 2019-09-24 LAB — HEPARIN LEVEL (UNFRACTIONATED)
Heparin Unfractionated: 0.46 IU/mL (ref 0.30–0.70)
Heparin Unfractionated: 0.3 IU/mL (ref 0.30–0.70)

## 2019-09-24 LAB — APTT
aPTT: 104 seconds — ABNORMAL HIGH (ref 24–36)
aPTT: 63 seconds — ABNORMAL HIGH (ref 24–36)

## 2019-09-24 LAB — FIBRINOGEN: Fibrinogen: 601 mg/dL — ABNORMAL HIGH (ref 210–475)

## 2019-09-24 LAB — PREPARE RBC (CROSSMATCH)

## 2019-09-24 LAB — LACTATE DEHYDROGENASE: LDH: 361 U/L — ABNORMAL HIGH (ref 98–192)

## 2019-09-24 LAB — MAGNESIUM: Magnesium: 2.4 mg/dL (ref 1.7–2.4)

## 2019-09-24 LAB — LACTIC ACID, PLASMA: Lactic Acid, Venous: 1.8 mmol/L (ref 0.5–1.9)

## 2019-09-24 MED ORDER — INSULIN ASPART 100 UNIT/ML ~~LOC~~ SOLN: 5.0000 [IU] | Freq: Three times a day (TID) | SUBCUTANEOUS | Status: DC

## 2019-09-24 MED ORDER — GABAPENTIN 250 MG/5ML PO SOLN
100.0000 mg | Freq: Two times a day (BID) | ORAL | Status: AC
  Administered 2019-09-27 – 2019-09-29 (×6): 100 mg
  Filled 2019-09-24 (×6): qty 2

## 2019-09-24 MED ORDER — TOBRAMYCIN 300 MG/5ML IN NEBU
300.0000 mg | INHALATION_SOLUTION | Freq: Two times a day (BID) | RESPIRATORY_TRACT | Status: AC
  Administered 2019-09-24 – 2019-09-30 (×14): 300 mg via RESPIRATORY_TRACT
  Filled 2019-09-24 (×15): qty 5

## 2019-09-24 MED ORDER — FUROSEMIDE 10 MG/ML IJ SOLN
60.0000 mg | Freq: Once | INTRAMUSCULAR | Status: AC
  Administered 2019-09-24: 20:00:00 60 mg via INTRAVENOUS
  Filled 2019-09-24: qty 6

## 2019-09-24 MED ORDER — CLONIDINE HCL 0.1 MG PO TABS
0.1000 mg | ORAL_TABLET | Freq: Four times a day (QID) | ORAL | Status: DC
  Administered 2019-09-24 – 2019-10-02 (×31): 0.1 mg via ORAL
  Filled 2019-09-24 (×31): qty 1

## 2019-09-24 MED ORDER — GABAPENTIN 250 MG/5ML PO SOLN
100.0000 mg | Freq: Three times a day (TID) | ORAL | Status: AC
  Administered 2019-09-24 – 2019-09-26 (×9): 100 mg
  Filled 2019-09-24 (×9): qty 2

## 2019-09-24 MED ORDER — SODIUM CHLORIDE 0.9% IV SOLUTION: Freq: Once | INTRAVENOUS | Status: AC

## 2019-09-24 MED ORDER — INSULIN DETEMIR 100 UNIT/ML ~~LOC~~ SOLN
15.0000 [IU] | Freq: Two times a day (BID) | SUBCUTANEOUS | Status: DC
  Administered 2019-09-24 – 2019-09-26 (×4): 15 [IU] via SUBCUTANEOUS
  Filled 2019-09-24 (×6): qty 0.15

## 2019-09-24 MED ORDER — PROPRANOLOL HCL 20 MG/5ML PO SOLN
40.0000 mg | Freq: Three times a day (TID) | ORAL | Status: DC
  Administered 2019-09-24 (×2): 40 mg via ORAL
  Filled 2019-09-24 (×3): qty 10

## 2019-09-24 MED ORDER — SODIUM CHLORIDE 0.9 % IV SOLN
2.0000 g | Freq: Three times a day (TID) | INTRAVENOUS | Status: DC
  Administered 2019-09-24 – 2019-09-30 (×18): 2 g via INTRAVENOUS
  Filled 2019-09-24 (×22): qty 2

## 2019-09-24 MED ORDER — INSULIN ASPART 100 UNIT/ML ~~LOC~~ SOLN
5.0000 [IU] | Freq: Every day | SUBCUTANEOUS | Status: DC
  Administered 2019-09-24 – 2019-09-25 (×6): 5 [IU] via SUBCUTANEOUS

## 2019-09-24 NOTE — Progress Notes (Signed)
Patient ID: [name], male   DOB: [birth year], 44 y.o.   MRN: [id number]    Advanced Heart Failure Rounding Note   Subjective:    Remains on VV ecmo.  Bronch yesterday for increased WOB showed diffuse purulent secretions. Had BAL. Growing >100K GNR. Developed sepsis. On meropenum and vanc. VP added  CVP 7  Sweep 13  On vent at 50% TVs > 300-400cc  CXR with diffuse infiltrates. No real changetoday. Crescent catheter well positioned Personally reviewed   ABG 7.45/37/55/96   hgb 7.5  Sodium 146 -> 152 -> 148. BUN 138 -> 127 -> 122 Cr 0.97 On IVF and FW  ECMO parameters - see separate ecmo note   Objective:   Weight Range:  Vital Signs:   Temp:  [98.6 F (37 C)] 98.6 F (37 C) (02/15 2000) Pulse Rate:  [89-103] 97 (02/16 0900) Resp:  [26-48] 32 (02/16 0900) BP: (150-162)/(62) 150/62 (02/16 0807) SpO2:  [90 %-100 %] 97 % (02/16 0900) Arterial Line BP: (92-171)/(45-84) 147/62 (02/16 0900) FiO2 (%):  [40 %-60 %] 60 % (02/16 0807) Weight:  [132.4 kg] 132.4 kg (02/16 0500) Last BM Date: 09/23/19  Weight change: Filed Weights   09/22/19 0645 09/23/19 0600 09/24/19 0500  Weight: 131 kg 131 kg 132.4 kg    Intake/Output:   Intake/Output Summary (Last 24 hours) at 09/24/2019 0932 Last data filed at 09/24/2019 0900 Gross per 24 hour  Intake 5884.84 ml  Output 3325 ml  Net 2559.84 ml     Physical Exam: General: Sedated on vent Will arouse to voice HEENT: normal Neck: supple. RIJ cannula. Carotids 2+ bilat; no bruits. No lymphadenopathy or thryomegaly appreciated. Cor: PMI nondisplaced. Regular rate & rhythm. No rubs, gallops or murmurs. Lungs: clear Abdomen: soft, nontender, nondistended. No hepatosplenomegaly. No bruits or masses. Good bowel sounds. Extremities: no cyanosis, clubbing, rash, edema Neuro: alert & orientedx3, cranial nerves grossly intact. moves all 4 extremities w/o difficulty. Affect pleasant   Telemetry: Sinus 90s Personally  reviewed   Labs: Basic Metabolic Panel: Recent Labs  Lab 09/20/19 0324 09/20/19 0340 09/21/19 0439 09/21/19 0447 09/22/19 0424 09/22/19 0433 09/22/19 1557 09/22/19 1558 09/23/19 0425 09/23/19 0452 09/23/19 1641 09/23/19 1651 09/23/19 2010 09/23/19 2138 09/24/19 0402 09/24/19 0410 09/24/19 0908  NA 142   < > 147*   < > 148*   < > 148*   < > 152*   < > 151*   < > 150* 149* 146* 149* 148*  K 3.8   < > 3.6   < > 3.8   < > 5.0   < > 4.7   < > 4.6   < > 5.0 5.1 5.0 5.0 4.8  CL 97*   < > 99   < > 105  --  109  --  112*  --  112*  --   --   --  108  --   --   CO2 34*   < > 36*   < > 32  --  30  --  29  --  27  --   --   --  27  --   --   GLUCOSE 168*   < > 116*   < > 181*  --  349*  --  235*  --  242*  --   --   --  317*  --   --   BUN 118*   < > 134*   < > 138*  --  139*  --  127*  --  125*  --   --   --  122*  --   --   CREATININE 1.07   < > 1.01   < > 1.00  --  1.01  --  1.02  --  0.94  --   --   --  0.97  --   --   CALCIUM 9.1   < > 9.1   < > 9.0   < > 9.0   < > 9.2  --  9.1  --   --   --  9.1  --   --   MG 2.2  --  2.4  --  2.5*  --   --   --  2.5*  --   --   --   --   --  2.4  --   --   PHOS 4.1  --  3.9  --  3.7  --   --   --  3.0  --   --   --   --   --  3.4  --   --    < > = values in this interval not displayed.    Liver Function Tests: No results for input(s): AST, ALT, ALKPHOS, BILITOT, PROT, ALBUMIN in the last 168 hours. No results for input(s): LIPASE, AMYLASE in the last 168 hours. No results for input(s): AMMONIA in the last 168 hours.  CBC: Recent Labs  Lab 09/22/19 0424 09/22/19 0433 09/22/19 1557 09/22/19 1558 09/23/19 0425 09/23/19 0452 09/23/19 1641 09/23/19 1651 09/23/19 2010 09/23/19 2138 09/24/19 0402 09/24/19 0410 09/24/19 0908  WBC 9.4  --  13.9*  --  12.8*  --  20.7*  --   --   --  21.4*  --   --   HGB 8.0*   < > 8.6*   < > 8.2*   < > 8.6*   < > 8.5* 7.8* 7.4* 7.8* 7.5*  HCT 27.5*   < > 29.7*   < > 28.7*   < > 30.4*   < > 25.0* 23.0*  26.1* 23.0* 22.0*  MCV 95.5  --  95.5  --  96.0  --  98.7  --   --   --  96.7  --   --   PLT 144*  --  154  --  151  --  155  --   --   --  125*  --   --    < > = values in this interval not displayed.    Cardiac Enzymes: No results for input(s): CKTOTAL, CKMB, CKMBINDEX, TROPONINI in the last 168 hours.  BNP: BNP (last 3 results) Recent Labs    07/22/19 1039  BNP 15.3    ProBNP (last 3 results) No results for input(s): PROBNP in the last 8760 hours.    Other results:  Imaging: DG CHEST PORT 1 VIEW  Result Date: 09/24/2019 CLINICAL DATA:  Ventilator, ECMO EXAM: PORTABLE CHEST 1 VIEW COMPARISON:  09/23/2019 FINDINGS: Tracheostomy, right PICC line, ECMO catheter remain in place, unchanged. Cardiomegaly. Severe diffuse bilateral airspace disease is unchanged. Low lung volumes. IMPRESSION: Severe bilateral airspace disease, unchanged. Cardiomegaly, stable. Support devices stable. Electronically Signed   By: Rolm Baptise M.D.   On: 09/24/2019 08:36   DG CHEST PORT 1 VIEW  Result Date: 09/23/2019 CLINICAL DATA:  Chest tube removal. EXAM: PORTABLE CHEST 1 VIEW COMPARISON:  Chest x-ray from same day at 0508 hours. FINDINGS: Interval  removal of the right chest tube. No pneumothorax. Unchanged tracheostomy tube, right IJ ECMO catheter, and right upper extremity PICC line. Stable cardiomediastinal silhouette. Unchanged patchy bilateral airspace disease. No pleural effusion. IMPRESSION: 1. Removal of the right chest tube.  No pneumothorax. 2. Unchanged bilateral airspace disease. Electronically Signed   By: Titus Dubin M.D.   On: 09/23/2019 13:23   DG CHEST PORT 1 VIEW  Result Date: 09/23/2019 CLINICAL DATA:  Chest tube removal. EXAM: PORTABLE CHEST 1 VIEW COMPARISON:  Radiograph yesterday. FINDINGS: Previous left chest tube has been removed. Right chest tube remains in place. No visualized pneumothorax. Tracheostomy tube tip at the thoracic inlet. Right internal jugular ECMO catheter with  tip extending into the upper abdomen below the field of view. Right upper extremity PICC in place. Patchy bilateral airspace opacities with slight improvement on the left from prior exam. Unchanged heart size and mediastinal contours. IMPRESSION: 1. Removal of left chest tube. No visualized pneumothorax. 2. Additional support apparatus unchanged. 3. Patchy bilateral airspace opacities with slight improvement on the left from prior exam. Electronically Signed   By: Keith Rake M.D.   On: 09/23/2019 06:10     Medications:     Scheduled Medications: . amiodarone  200 mg Oral Daily  . vitamin C  500 mg Per Tube Daily  . chlorhexidine gluconate (MEDLINE KIT)  15 mL Mouth Rinse BID  . Chlorhexidine Gluconate Cloth  6 each Topical Daily  . diazepam  10 mg Per Tube Q12H  . feeding supplement (OSMOLITE 1.5 CAL)  237 mL Per Tube 6 X Daily  . feeding supplement (PRO-STAT SUGAR FREE 64)  60 mL Per Tube QID  . free water  400 mL Per Tube Q6H  . gabapentin  100 mg Per Tube Q8H   Followed by  . [START ON 09/27/2019] gabapentin  100 mg Per Tube Q12H  . HYDROmorphone  8 mg Oral Q6H   Followed by  . [START ON 09/25/2019] HYDROmorphone  4 mg Oral Q6H   Followed by  . [START ON 09/28/2019] HYDROmorphone  2 mg Oral Q6H  . insulin aspart  0-20 Units Subcutaneous Q4H  . insulin aspart  6 Units Subcutaneous Q4H  . ipratropium-albuterol  3 mL Nebulization Q4H  . mouth rinse  15 mL Mouth Rinse 10 times per day  . montelukast  10 mg Per Tube QHS  . nutrition supplement (JUVEN)  1 packet Per Tube BID BM  . pantoprazole sodium  40 mg Per Tube BID  . psyllium  1 packet Oral BID  . QUEtiapine  50 mg Per Tube QHS  . sodium chloride flush  10-40 mL Intracatheter Q12H  . sodium chloride flush  10-40 mL Intracatheter Q12H  . tobramycin (PF)  300 mg Nebulization BID  . white petrolatum   Topical BID    Infusions: . sodium chloride    . sodium chloride Stopped (09/05/19 1900)  . sodium chloride 10 mL/hr at  09/21/19 1716  . dexmedetomidine (PRECEDEX) IV infusion Stopped (09/23/19 1336)  . heparin 1,600 Units/hr (09/24/19 0900)  . meropenem (MERREM) IV    . vancomycin Stopped (09/24/19 0853)  . vasopressin (PITRESSIN) infusion - *FOR SHOCK* 0.02 Units/min (09/24/19 0900)    PRN Medications: Place/Maintain arterial line **AND** sodium chloride, sodium chloride, sodium chloride, acetaminophen (TYLENOL) oral liquid 160 mg/5 mL, acetaminophen, hydrALAZINE, [COMPLETED] HYDROmorphone **FOLLOWED BY** HYDROmorphone **FOLLOWED BY** [START ON 09/25/2019] HYDROmorphone **FOLLOWED BY** [START ON 09/28/2019] HYDROmorphone **FOLLOWED BY** [START ON 10/01/2019] HYDROmorphone, magic mouthwash, magnesium  hydroxide, midazolam, ondansetron (ZOFRAN) IV, senna-docusate, sodium chloride, sodium chloride flush   Assessment/Plan:    1. Acute hypoxic/hypercarbic respiratory failure due to COVID PNA: Remained markedly hypoxic despite full support. Intubated 12/26. S/p bilateral CTs 12/26 for pneumothoraces.  TEE on 12/26 LVEF 70% RV ok. VV ECMO begun 12/26. Tracheostomy 12/29.  COVID ARDS at this point. ECMO circuit changed 1/8 & 1/23. Crescent cannula placed 2/2 (femoral cannuals removed) - ECMO parameters as per ECMO note - Flows look good on Crescent - Developed PNA and sepsis overnight.  BAL with > 100K GNR. On mero and vanc - Will rest him today. - Off precedex. Wean oral valium and dilaudid - Decrease sweep as tolertate - D/w CCM would be ok with permissive hypercapnea if he can maintain pH. Have stopped diamox to faciltate metabolic compensation - Volume status stable. Continue IVF to help with azotemia. ContinueFW with hypernatremia  - LDH 350 - Continue heparin. Discussed dosing with PharmD personally. - Continue chest PT  2. Bilateral PTX: Due to barotrauma.  - Resolved. CTs out  3. COVID PNA: management of COVID infection per CCM. CXR with bilateral multifocal PNA progressing to likely ARDS.  - s/p 10  days of remdesivir - 12/16, 12/2 s/p tociluzimab - 12/17 s/p convalescent plasma - Decadron at 79m/daily completed 1/11 - Completed cefepime/vanc - Off Ancef  4. ID: Empiric coverage with vancomycin/cefepime initially for possible secondary bacterial PNA, course completed.   - treated with cefepime and vanc for + cx with staph epidimidis, MSSA and serratia (sensitivities ok). - developed recurrent PND and sepsis 2/15 - BAL with > 100K GNR. On mero and vanc - Follow cultures  5. Anemia: - hgb 10.5 -> 8.2 -> 7.5  today. Transfuse as needed to keep Hgb> 8.0  7. FEN - now with G-tube.Continue TFs  8. Hypernatremia - NA 155 -> 143 -> 145 -> 142 -> 146 -> 150 -> 152 -> 148 - cONTINUE FW and continue IVF  9. AKI - Volume status much improved with lasix gtt. BUN continues to climb. Lasix off. Now on IVF ANDFW -  Possibility of CVVHD d/w Renal. No current indication for HD. D/w Renal. Would consider CVVHD once sedation weaned and felt to be uremic  10. DTIs - WOC following.   D/w with ECMO team (ECMO coordinator/specialists, CCM, TCTS and pharmD) at bedside on multidisciplinary rounds. Continue to await ARDS recovery. Details as above.   Length of Stay: 621 CRITICAL CARE Performed by: BGlori Bickers Total critical care time: 40  minutes  Critical care time was exclusive of separately billable procedures and treating other patients.  Critical care was necessary to treat or prevent imminent or life-threatening deterioration.  Critical care was time spent personally by me (independent of midlevel providers or residents) on the following activities: development of treatment plan with patient and/or surrogate as well as nursing, discussions with consultants, evaluation of patient's response to treatment, examination of patient, obtaining history from patient or surrogate, ordering and performing treatments and interventions, ordering and review of laboratory studies, ordering and  review of radiographic studies, pulse oximetry and re-evaluation of patient's condition.    DGlori BickersMD 09/24/2019, 9:32 AM  Advanced Heart Failure Team Pager [phone](M-F; 7a - 4p)  Please contact [facility] for night-coverage after hours (4p -7a ) and weekends on [email or web address]

## 2019-09-24 NOTE — Progress Notes (Addendum)
ANTICOAGULATION CONSULT NOTE - Follow Up Consult  Pharmacy Consult for heparin Indication: ECMO  Labs: Recent Labs    09/23/19 1641 09/23/19 1651 09/24/19 0402 09/24/19 0410 09/24/19 1132 09/24/19 1132 09/24/19 1542 09/24/19 1555  HGB 8.6*   < > 7.4*   < > 8.5*   < > 9.2* 7.1*  HCT 30.4*   < > 26.1*   < > 25.0*  --  27.0* 24.8*  PLT 155  --  125*  --   --   --   --  114*  APTT 67*  --  104*  --   --   --   --  63*  HEPARINUNFRC 0.32  --  0.46  --   --   --   --  0.30  CREATININE 0.94  --  0.97  --   --   --   --  0.90   < > = values in this interval not displayed.    Assessment: 44yo male on VV- ECMO.  Heparin level resulted therapeutic at 0.3 on heparin at 1600 units/hr. Hgb now down to 7.1 (to transfuse 1u PRBC per MD), plt trending down to 114. Fibrinogen increased slightly to 601. LDH at 361. Bleeding in trach noted this morning; however, RN states this is actually somewhat improved this afternoon.  Goal of Therapy:  Heparin level 0.3-0.5 units/ml (aiming for lowest end of range)    Plan:  -Continue heparin infusion at 1600 units/h -Monitor heparin levels q12h, CBC, LDH, fibrinogen, and s/sx of bleeding   Elicia Lamp, PharmD, BCPS Please check AMION for all [place] contact numbers Clinical Pharmacist 09/24/2019 5:10 PM

## 2019-09-24 NOTE — Progress Notes (Signed)
Physical Therapy Treatment Patient Details Name: [name] MRN: [id number] DOB: June 06, 1976 Today's Date: 09/24/2019    History of Present Illness 44 y/o M admitted 12/14 with COVID pneumonia causing acute hypoxemic respiratory failure.   Developed pneumomediastinum 12/23 with concern for possible bilateral small pneumothoraces on 12/24. Pt intubated on 12/26 and started on ECMO on 12/27. Tracheostomy on 12/29 with bleeding from trach an dother sites on 12/31. Bronch on 1/11 and 1/15. Slowly weaning sedation 2/5.    PT Comments    Patient not progressing towards mobility this session secondary to poor arousal. Played some 80s/oldies music to try to help with arousal/stimulus. Pt opened eyes for a few seconds when [name] (his fav song per mother) came on, otherwise eyes remained closed. No blink to threat bilaterally. Pt not following any commands. No response to noxious stimulus. Performed PROM for all extremities and instructed pt's mother to do these exercises daily. Noted to have swelling/pitting edema in BLEs and feet.  Pt might benefit from PRAFOs. At this time, signing off on patient due to inability to participate in therapy services secondary to arousal. Mother/nursing can perform PROM daily. Discharge from therapy. Please re consult if pt becomes appropriate.    Follow Up Recommendations  LTACH     Equipment Recommendations  Other (comment)(defer to next venue)    Recommendations for Other Services       Precautions / Restrictions Precautions Precautions: Fall;Other (comment) Precaution Comments: ECMO R IJ, Vent to trach, G tube Restrictions Weight Bearing Restrictions: Yes RLE Weight Bearing: Non weight bearing LLE Weight Bearing: Non weight bearing Other Position/Activity Restrictions: due to tissue injury    Mobility  Bed Mobility Overal bed mobility: (Deferred due to arousal and not following any commands)                Transfers                     Ambulation/Gait                 Stairs             Wheelchair Mobility    Modified Rankin (Stroke Patients Only)       Balance                                            Cognition Arousal/Alertness: Lethargic   Overall Cognitive Status: Difficult to assess                                 General Comments: Right eye opened for a few seconds when playing a [employer] (mom present and states he likes this song) but otherwise eyes remained closed. No response to noxious stmulus, does not follow any commands      Exercises General Exercises - Upper Extremity Shoulder Flexion: PROM;Left;10 reps Shoulder ABduction: PROM;10 reps;Right Elbow Flexion: PROM;10 reps;Both Elbow Extension: PROM;Both;10 reps Wrist Flexion: PROM;Both;10 reps Wrist Extension: PROM;Both;10 reps Digit Composite Flexion: PROM;Both;10 reps Composite Extension: PROM;Both;10 reps General Exercises - Lower Extremity Ankle Circles/Pumps: PROM;Both;20 reps Short Arc Quad: Both;10 reps;Supine;PROM Heel Slides: PROM;Both;10 reps;Supine Hip ABduction/ADduction: PROM;Both;10 reps;Supine Hip Flexion/Marching: PROM;Both;10 reps;Supine    General Comments General comments (skin integrity, edema, etc.): Pt trached on the vent with 14 PEEP, 60% Fi02 and  RR ranging in mid 30s      Pertinent Vitals/Pain Pain Assessment: Faces Faces Pain Scale: No hurt    Home Living                      Prior Function            PT Goals (current goals can now be found in the care plan section) Progress towards PT goals: Not progressing toward goals - comment(level of arousal)    Frequency    Min 1X/week(monitor)      PT Plan Current plan remains appropriate    Co-evaluation              AM-PAC PT "6 Clicks" Mobility   Outcome Measure  Help needed turning from your back to your side while in a flat bed without using bedrails?:  Total Help needed moving from lying on your back to sitting on the side of a flat bed without using bedrails?: Total Help needed moving to and from a bed to a chair (including a wheelchair)?: Total Help needed standing up from a chair using your arms (e.g., wheelchair or bedside chair)?: Total Help needed to walk in hospital room?: Total Help needed climbing 3-5 steps with a railing? : Total 6 Click Score: 6    End of Session Equipment Utilized During Treatment: Other (comment)(trach on vent) Activity Tolerance: Patient limited by lethargy Patient left: in bed;with call bell/phone within reach;with family/visitor present Nurse Communication: Mobility status PT Visit Diagnosis: Muscle weakness (generalized) (M62.81)     Time: 6812-7517 PT Time Calculation (min) (ACUTE ONLY): 26 min  Charges:  $Therapeutic Exercise: 23-37 mins                     Marisa Severin, PT, DPT Acute Rehabilitation Services Pager [phone] Office [phone]       Marguarite Arbour A Sabra Heck 09/24/2019, 3:04 PM

## 2019-09-24 NOTE — CV Procedure (Signed)
   ECMO NOTE:  Indication: Respiratory failure due to COVID PNA  Initial cannulation date: 08/03/2019  ECMO type: VV ECMO ([facility])  Dual lumen  Inflow/return cannula:  1) 32 FR Crescent placed 2/2  ECMO events:  Initial cannulation 12/26 Circuit changed and trach exchanged on 1/8.   Underwent addition of a RIJ drainage cannula on 1/20 Circuit changed on 1/22 for rising LDH Had evidence of recirculation and RIJ cannula pulled back on 1/26 19 FR RIJ removed 09/06/19 Underwent change to [facility] catheter with removal of femoral lines 2/2 Sweep trial 2/14. 30 mins  PCO2 55 -> 75  Daily data:  Remains on VV ecmo with good flows.    Flow 4.88 RPM 3600 dP 32 Pven  -73 Sweep  13 SVO2 75%  Labs:  Hgb 7.5 LDH 361 Heparin level: 0.46 Lactic acid 1.8  Plan:  Crescent cannula working well Excellent flow and sats. Now weaning flow Developed recurrent PNA and sepsis yesterday. Back on broad spectrum abx and VP Await cx. Wean VP as tolerated Wean sweep slowly Pulmonary rest day today. No sweep trial    Glori Bickers, MD  8:34 AM

## 2019-09-24 NOTE — Progress Notes (Signed)
[facility] Hgb reading 6.9. Dr Orvan Seen at bedside for PM rounds. Verbal order for 1 unit of RBC's.

## 2019-09-24 NOTE — Progress Notes (Signed)
Paged Dr Wallene Dales regarding patient's hypertension MAP's above 25 for past hour. Also. Hgb of 7.1. MD gave verbal order to restart Clonidine 0.17m Q6 hours and Propranolol 40 mg 3 times daily. MD does not want to transfuse at this time. Will continue to monitor.

## 2019-09-24 NOTE — Progress Notes (Addendum)
NAME:  [name], MRN:  [id number], DOB:  31-Aug-1975, LOS: 3 ADMISSION DATE:  07/22/2019, CONSULTATION DATE:  12/24 REFERRING MD:  Sloan Leiter, CHIEF COMPLAINT:  Dyspnea   Brief History   44 y/o male admitted 12/14 with COVID pneumonia leading to ARDS, pneumomediastinum.  Intubated 12/26, bilateral chest tubes placed.  Past Medical History  GERD HTN Asthma  Significant Hospital Events   12/14 admit with hypoxemic respiratory failure in setting of COVID-19 pneumonia 12/23 noted to have pneumomediastinum on chest x-ray 12/24PCCM consulted for evaluation 12/26 worsening hypoxemia, intubated 12/27 VV fem/fem ECMO cannulation 12/29 tracheostomy >> See bedside nursing event notes for full rundown of procedures after 12/27 1/5 epistaxis, nasal packing 1/30 improved cxr, improved lung compliance, TV 350s on 15PC  1/31 CXR increased infiltrates, +CFB     2/2 Converted to right IJ Crescent cannula. 2/3 PEG tube inserted at bedside 2/14 30 min sweep trial> paO2 105 (18 PEEP, 60% FiO2) pH 7.22, pCO2 87  Consults:  PCCM, CTS, CHF, Palliative  Procedures:  PICC 1/3 >> R radial a-line 1/20 >> R IJ ECMO cath 2/3 >> Tracheostomy 12/29 >>  Significant Diagnostic Tests:  CT chest 12/22>>extensive pneumomediastinum, multifocal patchy bilateral groundglass opacities CXR 1/25 >> improving bilateral infiltrates. ECMO cannulae in place. CXR 2/5 >> clearing bilateral infiltrates  Micro Data:  BCx2 12/14 >>negative  Sputum 12/15 >> OPF 12/28 blood > negative 12/29 > staph aureus 1/4 bronch:  1/2 acid fast smear bronch: negative 1/2 fungal cx bronch: negative1/12 BAL: serratia, staph epidermidisu 1/4 BAL > MSSA, Serratia 1/9 blood > negative 1/9 Resp culture > MSSA, serratia 1/11 serratia > negative 1/12 fungus culture > negative 1/12 BAL > serratia 1/18 BAL >> rare Serratia. 1/24 blood > negative 1/30 blood > negative 1/30 resp culture > serratia 2/1 BAL >> rare Serratia -  enterococcus 2/1 aspergillus Ag > negative 2/2 resp culture > serratia  2/2 blood > negative 2/5 urine > negative 2/9 resp: serratia  Antimicrobials:  Received remdesivir, steroids, actemra and convalescent plasma  5 days cefepime 12/24-12/29 Cefepime 1/4->1/13 vanc 1/4> 1/11 Ceftriaxone 1/14 > 1/27 vanc x1 1/20 Cefazolin 1/29 > 2/2 cefuroxim 2/2 vanc 2/2 Ceftriaxone x1 2/4  Interim history/subjective:   Increasing sweep trial tolerance.  Fever with increased work of breathing and desaturation yesterday.  Started back on antibiotics.  Objective   Blood pressure (!) 134/57, pulse (!) 102, temperature 98.8 F (37.1 C), temperature source Oral, resp. rate (!) 27, height _0  (1.727 m), weight 132.4 kg, SpO2 100 %. CVP:  [4 mmHg-12 mmHg] 12 mmHg  Vent Mode: PCV FiO2 (%):  [60 %] 60 % Set Rate:  [15 bmp] 15 bmp PEEP:  [14 cmH20] 14 cmH20 Plateau Pressure:  [20 cmH20-29 cmH20] 20 cmH20   Intake/Output Summary (Last 24 hours) at 09/24/2019 1432 Last data filed at 09/24/2019 1400 Gross per 24 hour  Intake 5423.95 ml  Output 2975 ml  Net 2448.95 ml   Filed Weights   09/22/19 0645 09/23/19 0600 09/24/19 0500  Weight: 131 kg 131 kg 132.4 kg    Examination:  General:  In bed on vent HENT: NCAT tracheostomy in place PULM: , vent supported breathing.  Rhonchi bilaterally.  Better aeration to posterior axillary line bilaterally. CV: RRR, no mgr GI: BS+, soft, nontender MSK: normal bulk and tone Neuro: sedated on vent  ECMO: VV, cardiohelp, IJ catheter, flow ~4.8Lpm, SvO2 70%  2/16 personally reviewed chest x-ray-poor inspiratory effort.  Shows possibly some increased markings in  the area of the left lower lung fields.  Resolved Hospital Problem list     Assessment & Plan:  ARDS due to COVID 19, requiring mechanical ventilation and ECMO support > oxygenation has improved significantly Would adjust sweep to target pCO2 around 90 Continue with PEEP at 10 driving  pressure 15. Observe for spontaneous tidal volume recovery VAP prevention Trach care per routine Continue sedation taper.  Pneumothoraces Remove left chest tube  Hypernatremia Agree with increased dose of free water, may need to increase more again on 2/16 Metamucil  AKI with azotemia, BUN slwoly coming down Monitor BMET and UOP Replace electrolytes as needed Agree with avoiding CRRT  Nutrition, ileus? > no evidence of that Continue bolus feeding  Blood tinged GI secretions > none on 2/15 monitor  Sedation needs Acute metabolic encephalopathy> medication related Wean dilaudid and valium  Hypertension Clonidine Propranolol  Serratia pneumonia> treated Monitor for fever  Neuro muscular weakness PT consult this week> range of motion   Best practice:  Pain/Anxiety/Delirium protocol (if indicated): keep infusions off. Continue enteral agents. Dexmedetomidine and prn Versed and Dilaudid. Begin to taper enteral sedative.  VAP protocol (if indicated): Bundle in place. Respiratory support goals: Pressure control ventilation with ultra protective lung ventilation. Titrate FiO2 down again. Continue PEEP 10 Blood pressure target:  MAP>65, off pressors DVT prophylaxis: Remains on systemic heparin - target Heparin level 0.3-0.7 Nutrition Status: Nutrition Problem: Increased nutrient needs Etiology: acute illness(COVID PNA) Signs/Symptoms: estimated needs Interventions: Tube feeding, Prostat  Bolus feed. GI prophylaxis: Protonix per tube, change to BID today Fluid status goals: cont lasix infusion Urinary catheter: Guide hemodynamic management Glucose control: Euglycemic control with SSI Mobility/therapy needs: Bedrest.  Daily labs: as per ECMO protocol. Code Status: Full code  Family Communication: Wife updated 2/13 Disposition: ICU  Labs   CBC: Recent Labs  Lab 09/22/19 0424 09/22/19 0433 09/22/19 1557 09/22/19 1558 09/23/19 0425 09/23/19 0109 09/23/19 1641  09/23/19 1651 09/23/19 2138 09/24/19 0402 09/24/19 0410 09/24/19 0908 09/24/19 1132  WBC 9.4  --  13.9*  --  12.8*  --  20.7*  --   --  21.4*  --   --   --   HGB 8.0*   < > 8.6*   < > 8.2*   < > 8.6*   < > 7.8* 7.4* 7.8* 7.5* 8.5*  HCT 27.5*   < > 29.7*   < > 28.7*   < > 30.4*   < > 23.0* 26.1* 23.0* 22.0* 25.0*  MCV 95.5  --  95.5  --  96.0  --  98.7  --   --  96.7  --   --   --   PLT 144*  --  154  --  151  --  155  --   --  125*  --   --   --    < > = values in this interval not displayed.    Basic Metabolic Panel: Recent Labs  Lab 09/20/19 0324 09/20/19 0340 09/21/19 0439 09/21/19 0447 09/22/19 0424 09/22/19 0433 09/22/19 1557 09/22/19 1558 09/23/19 0425 09/23/19 0452 09/23/19 1641 09/23/19 1651 09/23/19 2138 09/24/19 0402 09/24/19 0410 09/24/19 0908 09/24/19 1132  NA 142   < > 147*   < > 148*   < > 148*   < > 152*   < > 151*   < > 149* 146* 149* 148* 149*  K 3.8   < > 3.6   < > 3.8   < > 5.0   < >  4.7   < > 4.6   < > 5.1 5.0 5.0 4.8 4.8  CL 97*   < > 99   < > 105  --  109  --  112*  --  112*  --   --  108  --   --   --   CO2 34*   < > 36*   < > 32  --  30  --  29  --  27  --   --  27  --   --   --   GLUCOSE 168*   < > 116*   < > 181*  --  349*  --  235*  --  242*  --   --  317*  --   --   --   BUN 118*   < > 134*   < > 138*  --  139*  --  127*  --  125*  --   --  122*  --   --   --   CREATININE 1.07   < > 1.01   < > 1.00  --  1.01  --  1.02  --  0.94  --   --  0.97  --   --   --   CALCIUM 9.1   < > 9.1   < > 9.0  --  9.0  --  9.2  --  9.1  --   --  9.1  --   --   --   MG 2.2  --  2.4  --  2.5*  --   --   --  2.5*  --   --   --   --  2.4  --   --   --   PHOS 4.1  --  3.9  --  3.7  --   --   --  3.0  --   --   --   --  3.4  --   --   --    < > = values in this interval not displayed.   GFR: Estimated Creatinine Clearance: 127.9 mL/min (by C-G formula based on SCr of 0.97 mg/dL). Recent Labs  Lab 09/22/19 0424 09/22/19 0424 09/22/19 1557 09/23/19 0425  09/23/19 1641 09/23/19 2006 09/24/19 0402  WBC 9.4   < > 13.9* 12.8* 20.7*  --  21.4*  LATICACIDVEN 0.8  --   --  1.1  --  1.6 1.8   < > = values in this interval not displayed.    Liver Function Tests: No results for input(s): AST, ALT, ALKPHOS, BILITOT, PROT, ALBUMIN in the last 168 hours. No results for input(s): LIPASE, AMYLASE in the last 168 hours. No results for input(s): AMMONIA in the last 168 hours.  ABG    Component Value Date/Time   PHART 7.399 09/24/2019 1132   PCO2ART 46.2 09/24/2019 1132   PO2ART 81.0 (L) 09/24/2019 1132   HCO3 28.5 (H) 09/24/2019 1132   TCO2 30 09/24/2019 1132   ACIDBASEDEF 4.0 (H) 09/16/2019 2334   O2SAT 96.0 09/24/2019 1132     Coagulation Profile: No results for input(s): INR, PROTIME in the last 168 hours.  Cardiac Enzymes: No results for input(s): CKTOTAL, CKMB, CKMBINDEX, TROPONINI in the last 168 hours.  HbA1C: Hgb A1c MFr Bld  Date/Time Value Ref Range Status  09/23/2019 08:58 AM 5.5 4.8 - 5.6 % Final    Comment:    (NOTE) Pre diabetes:  5.7%-6.4% Diabetes:              >6.4% Glycemic control for   <7.0% adults with diabetes   07/24/2019 04:50 AM 6.7 (H) 4.8 - 5.6 % Final    Comment:    (NOTE) Pre diabetes:          5.7%-6.4% Diabetes:              >6.4% Glycemic control for   <7.0% adults with diabetes     CBG: Recent Labs  Lab 09/23/19 1531 09/23/19 2004 09/23/19 2347 09/24/19 0408 09/24/19 0733  GLUCAP 194* 186* 242* 284* 277*     CRITICAL CARE Performed by: Kipp Brood   Total critical care time: 40 minutes  Critical care time was exclusive of separately billable procedures and treating other patients.  Critical care was necessary to treat or prevent imminent or life-threatening deterioration.  Critical care was time spent personally by me on the following activities: development of treatment plan with patient and/or surrogate as well as nursing, discussions with consultants, evaluation  of patient's response to treatment, examination of patient, obtaining history from patient or surrogate, ordering and performing treatments and interventions, ordering and review of laboratory studies, ordering and review of radiographic studies, pulse oximetry, re-evaluation of patient's condition and participation in multidisciplinary rounds.  Kipp Brood, MD [facility] ICU Physician [place]  Pager: [phone] Mobile: [phone] After hours: [phone].

## 2019-09-24 NOTE — Progress Notes (Signed)
ANTICOAGULATION CONSULT NOTE - Follow Up Consult  Pharmacy Consult for heparin Indication: ECMO  Labs: Recent Labs    09/23/19 0425 09/23/19 0452 09/23/19 1641 09/23/19 1651 09/23/19 2138 09/23/19 2138 09/24/19 0402 09/24/19 0410  HGB 8.2*   < > 8.6*   < > 7.8*   < > 7.4* 7.8*  HCT 28.7*   < > 30.4*   < > 23.0*  --  26.1* 23.0*  PLT 151  --  155  --   --   --  125*  --   APTT 69*  --  67*  --   --   --  104*  --   HEPARINUNFRC 0.34  --  0.32  --   --   --  0.46  --   CREATININE 1.02  --  0.94  --   --   --  0.97  --    < > = values in this interval not displayed.    Assessment: 44yo male on VV- ECMO.  Heparin level came back thearpeutic at 0.46, on 1700 units/hr. Hgb now down to 7.8, plt trending down to 125 from 151. Fibrinogen increased slightly to 601. LDH at 361. Bleeding in trach this morning.  Goal of Therapy:  Heparin level 0.3-0.5 units/ml (aiming for lowest end)   Plan:  -Reduce heparin infusion to 1600 units/h -Monitor HL q12, CBC, LDH, fibrinogen, and s/sx of bleeding   Antonietta Jewel, PharmD, BCCCP Clinical Pharmacist  Phone: [phone]  Please check AMION for all [name] phone numbers After 10:00 PM, call [place] [phone] 09/24/2019

## 2019-09-24 NOTE — Consult Note (Signed)
[place] Nurse wound follow up Patient receiving care in [facility] 2H04.  Assisted with posterior neck assessment by primary RN, Minette Brine. Wound type: Bilateral, lateral feet have dry, hardened purple/black eschar that are open to air.  Dry, stable eschar is preferable to anything wet and gooey; this type of environment would create a breeding ground for all types of microbes.  The posterior neck beneath the trach ties is healing very nicely.  The wound area that I could view is pink, no depth, and I recommend continuing the Xeroform gauze and foam dressing. Monitor the wound area(s) for worsening of condition such as: Signs/symptoms of infection,  Increase in size,  Development of or worsening of odor, Development of pain, or increased pain at the affected locations.  Notify the medical team if any of these develop. Val Riles, RN, MSN, CWOCN, CNS-BC, pager [phone]

## 2019-09-24 NOTE — Plan of Care (Signed)
  Problem: Education: Goal: Knowledge of risk factors and measures for prevention of condition will improve Outcome: Not Progressing   Problem: Coping: Goal: Psychosocial and spiritual needs will be supported Outcome: Not Progressing   Problem: Respiratory: Goal: Complications related to the disease process, condition or treatment will be avoided or minimized Outcome: Not Progressing   Problem: Respiratory: Goal: Ability to maintain a clear airway and adequate ventilation will improve Outcome: Not Progressing   Problem: Role Relationship: Goal: Method of communication will improve Outcome: Not Progressing

## 2019-09-24 NOTE — Progress Notes (Signed)
Inpatient Diabetes Program Recommendations  AACE/ADA: New Consensus Statement on Inpatient Glycemic Control (2015)  Target Ranges:  Prepandial:   less than 140 mg/dL      Peak postprandial:   less than 180 mg/dL (1-2 hours)      Critically ill patients:  140 - 180 mg/dL   Lab Results  Component Value Date   GLUCAP 277 (H) 09/24/2019   HGBA1C 5.5 09/23/2019    Review of Glycemic Control Results for [name], [name] (MRN [id number]) as of 09/24/2019 10:14  Ref. Range 09/23/2019 20:04 09/23/2019 23:47 09/24/2019 04:08 09/24/2019 07:33  Glucose-Capillary Latest Ref Range: 70 - 99 mg/dL 186 (H) 242 (H) 284 (H) 277 (H)   Outpatient Diabetes medications: none Current orders for Inpatient glycemic control: Novolog 6 units Q4H, Novolog 0-20 units Q4H Osmolite 1.5 cal Q4H  Inpatient Diabetes Program Recommendations:    Consider increasing Novolog 10 units Q4H (to be stopped or held in the event tube feeds are stopped).   Thanks, Bronson Curb, MSN, RNC-OB Diabetes Coordinator [phone] (8a-5p)

## 2019-09-25 ENCOUNTER — Inpatient Hospital Stay (HOSPITAL_COMMUNITY): Payer: Managed Care, Other (non HMO)

## 2019-09-25 LAB — POCT I-STAT 7, (LYTES, BLD GAS, ICA,H+H)
Acid-Base Excess: 6 mmol/L — ABNORMAL HIGH (ref 0.0–2.0)
Acid-Base Excess: 5 mmol/L — ABNORMAL HIGH (ref 0.0–2.0)
Acid-Base Excess: 5 mmol/L — ABNORMAL HIGH (ref 0.0–2.0)
Acid-Base Excess: 4 mmol/L — ABNORMAL HIGH (ref 0.0–2.0)
Bicarbonate: 31.6 mmol/L — ABNORMAL HIGH (ref 20.0–28.0)
Bicarbonate: 29.8 mmol/L — ABNORMAL HIGH (ref 20.0–28.0)
Bicarbonate: 29.8 mmol/L — ABNORMAL HIGH (ref 20.0–28.0)
Bicarbonate: 29.3 mmol/L — ABNORMAL HIGH (ref 20.0–28.0)
Calcium, Ion: 1.35 mmol/L (ref 1.15–1.40)
Calcium, Ion: 1.32 mmol/L (ref 1.15–1.40)
Calcium, Ion: 1.3 mmol/L (ref 1.15–1.40)
Calcium, Ion: 1.32 mmol/L (ref 1.15–1.40)
HCT: 24 % — ABNORMAL LOW (ref 39.0–52.0)
HCT: 28 % — ABNORMAL LOW (ref 39.0–52.0)
HCT: 26 % — ABNORMAL LOW (ref 39.0–52.0)
HCT: 28 % — ABNORMAL LOW (ref 39.0–52.0)
Hemoglobin: 8.2 g/dL — ABNORMAL LOW (ref 13.0–17.0)
Hemoglobin: 9.5 g/dL — ABNORMAL LOW (ref 13.0–17.0)
Hemoglobin: 8.8 g/dL — ABNORMAL LOW (ref 13.0–17.0)
Hemoglobin: 9.5 g/dL — ABNORMAL LOW (ref 13.0–17.0)
O2 Saturation: 91 %
O2 Saturation: 94 %
O2 Saturation: 94 %
O2 Saturation: 93 %
Patient temperature: 36.8
Patient temperature: 98.6
Patient temperature: 98.5
Patient temperature: 98.5
Potassium: 4.1 mmol/L (ref 3.5–5.1)
Potassium: 4.2 mmol/L (ref 3.5–5.1)
Potassium: 4.4 mmol/L (ref 3.5–5.1)
Potassium: 4.4 mmol/L (ref 3.5–5.1)
Sodium: 150 mmol/L — ABNORMAL HIGH (ref 135–145)
Sodium: 150 mmol/L — ABNORMAL HIGH (ref 135–145)
Sodium: 150 mmol/L — ABNORMAL HIGH (ref 135–145)
Sodium: 150 mmol/L — ABNORMAL HIGH (ref 135–145)
TCO2: 33 mmol/L — ABNORMAL HIGH (ref 22–32)
TCO2: 31 mmol/L (ref 22–32)
TCO2: 31 mmol/L (ref 22–32)
TCO2: 31 mmol/L (ref 22–32)
pCO2 arterial: 49.2 mmHg — ABNORMAL HIGH (ref 32.0–48.0)
pCO2 arterial: 45.3 mmHg (ref 32.0–48.0)
pCO2 arterial: 45.5 mmHg (ref 32.0–48.0)
pCO2 arterial: 44.8 mmHg (ref 32.0–48.0)
pH, Arterial: 7.415 (ref 7.350–7.450)
pH, Arterial: 7.427 (ref 7.350–7.450)
pH, Arterial: 7.424 (ref 7.350–7.450)
pH, Arterial: 7.423 (ref 7.350–7.450)
pO2, Arterial: 60 mmHg — ABNORMAL LOW (ref 83.0–108.0)
pO2, Arterial: 69 mmHg — ABNORMAL LOW (ref 83.0–108.0)
pO2, Arterial: 70 mmHg — ABNORMAL LOW (ref 83.0–108.0)
pO2, Arterial: 66 mmHg — ABNORMAL LOW (ref 83.0–108.0)

## 2019-09-25 LAB — CULTURE, BAL-QUANTITATIVE W GRAM STAIN: Culture: >=100000 — AB

## 2019-09-25 LAB — CBC
HCT: 28.4 % — ABNORMAL LOW (ref 39.0–52.0)
HCT: 29 % — ABNORMAL LOW (ref 39.0–52.0)
Hemoglobin: 8.3 g/dL — ABNORMAL LOW (ref 13.0–17.0)
Hemoglobin: 8.4 g/dL — ABNORMAL LOW (ref 13.0–17.0)
MCH: 28.1 pg (ref 26.0–34.0)
MCH: 27.5 pg (ref 26.0–34.0)
MCHC: 29.2 g/dL — ABNORMAL LOW (ref 30.0–36.0)
MCHC: 29 g/dL — ABNORMAL LOW (ref 30.0–36.0)
MCV: 96.3 fL (ref 80.0–100.0)
MCV: 95.1 fL (ref 80.0–100.0)
Platelets: 113 10*3/uL — ABNORMAL LOW (ref 150–400)
Platelets: 114 10*3/uL — ABNORMAL LOW (ref 150–400)
RBC: 2.95 MIL/uL — ABNORMAL LOW (ref 4.22–5.81)
RBC: 3.05 MIL/uL — ABNORMAL LOW (ref 4.22–5.81)
RDW: 17.6 % — ABNORMAL HIGH (ref 11.5–15.5)
RDW: 17.9 % — ABNORMAL HIGH (ref 11.5–15.5)
WBC: 14.4 10*3/uL — ABNORMAL HIGH (ref 4.0–10.5)
WBC: 15 10*3/uL — ABNORMAL HIGH (ref 4.0–10.5)
nRBC: 0.4 % — ABNORMAL HIGH (ref 0.0–0.2)
nRBC: 0.2 % (ref 0.0–0.2)

## 2019-09-25 LAB — BASIC METABOLIC PANEL
Anion gap: 9 (ref 5–15)
Anion gap: 9 (ref 5–15)
BUN: 108 mg/dL — ABNORMAL HIGH (ref 6–20)
BUN: 111 mg/dL — ABNORMAL HIGH (ref 6–20)
CO2: 28 mmol/L (ref 22–32)
CO2: 27 mmol/L (ref 22–32)
Calcium: 9.2 mg/dL (ref 8.9–10.3)
Calcium: 9.3 mg/dL (ref 8.9–10.3)
Chloride: 110 mmol/L (ref 98–111)
Chloride: 111 mmol/L (ref 98–111)
Creatinine, Ser: 0.92 mg/dL (ref 0.61–1.24)
Creatinine, Ser: 0.82 mg/dL (ref 0.61–1.24)
GFR calc Af Amer: >60 mL/min (ref >60)
GFR calc Af Amer: >60 mL/min (ref >60)
GFR calc non Af Amer: >60 mL/min (ref >60)
GFR calc non Af Amer: >60 mL/min (ref >60)
Glucose, Bld: 301 mg/dL — ABNORMAL HIGH (ref 70–99)
Glucose, Bld: 288 mg/dL — ABNORMAL HIGH (ref 70–99)
Potassium: 4.1 mmol/L (ref 3.5–5.1)
Potassium: 4.4 mmol/L (ref 3.5–5.1)
Sodium: 147 mmol/L — ABNORMAL HIGH (ref 135–145)
Sodium: 147 mmol/L — ABNORMAL HIGH (ref 135–145)

## 2019-09-25 LAB — GLUCOSE, CAPILLARY
Glucose-Capillary: 192 mg/dL — ABNORMAL HIGH (ref 70–99)
Glucose-Capillary: 212 mg/dL — ABNORMAL HIGH (ref 70–99)
Glucose-Capillary: 218 mg/dL — ABNORMAL HIGH (ref 70–99)
Glucose-Capillary: 248 mg/dL — ABNORMAL HIGH (ref 70–99)
Glucose-Capillary: 253 mg/dL — ABNORMAL HIGH (ref 70–99)
Glucose-Capillary: 262 mg/dL — ABNORMAL HIGH (ref 70–99)

## 2019-09-25 LAB — COOXEMETRY PANEL
Carboxyhemoglobin: 3 % — ABNORMAL HIGH (ref 0.5–1.5)
Methemoglobin: 1.3 % (ref 0.0–1.5)
O2 Saturation: 76.6 %
Total hemoglobin: 8.2 g/dL — ABNORMAL LOW (ref 12.0–16.0)

## 2019-09-25 LAB — PHOSPHORUS: Phosphorus: 3.6 mg/dL (ref 2.5–4.6)

## 2019-09-25 LAB — HEPARIN LEVEL (UNFRACTIONATED)
Heparin Unfractionated: 0.15 IU/mL — ABNORMAL LOW (ref 0.30–0.70)
Heparin Unfractionated: 0.14 IU/mL — ABNORMAL LOW (ref 0.30–0.70)
Heparin Unfractionated: 0.22 IU/mL — ABNORMAL LOW (ref 0.30–0.70)

## 2019-09-25 LAB — LACTATE DEHYDROGENASE: LDH: 362 U/L — ABNORMAL HIGH (ref 98–192)

## 2019-09-25 LAB — APTT
aPTT: 51 seconds — ABNORMAL HIGH (ref 24–36)
aPTT: 55 seconds — ABNORMAL HIGH (ref 24–36)

## 2019-09-25 LAB — LACTIC ACID, PLASMA: Lactic Acid, Venous: 1.4 mmol/L (ref 0.5–1.9)

## 2019-09-25 LAB — MAGNESIUM: Magnesium: 2.3 mg/dL (ref 1.7–2.4)

## 2019-09-25 LAB — FIBRINOGEN: Fibrinogen: 693 mg/dL — ABNORMAL HIGH (ref 210–475)

## 2019-09-25 MED ORDER — INSULIN ASPART 100 UNIT/ML ~~LOC~~ SOLN
5.0000 [IU] | Freq: Every day | SUBCUTANEOUS | Status: DC
  Administered 2019-09-25 – 2019-09-26 (×4): 5 [IU] via SUBCUTANEOUS

## 2019-09-25 MED ORDER — DIAZEPAM 5 MG PO TABS
5.0000 mg | ORAL_TABLET | Freq: Two times a day (BID) | ORAL | Status: DC
  Administered 2019-09-25 (×2): 5 mg
  Filled 2019-09-25 (×2): qty 1

## 2019-09-25 MED ORDER — PROPRANOLOL HCL 20 MG/5ML PO SOLN
40.0000 mg | Freq: Three times a day (TID) | ORAL | Status: DC
  Administered 2019-09-25 – 2019-09-30 (×16): 40 mg
  Filled 2019-09-25 (×16): qty 10

## 2019-09-25 NOTE — Progress Notes (Signed)
Patient transferred from specialty bed back to regular ICU bed. ECMO specialist, multiple RNs, and multiple RRTs present. No issues noted.

## 2019-09-25 NOTE — Progress Notes (Signed)
ANTICOAGULATION CONSULT NOTE - Follow Up Consult  Pharmacy Consult for heparin Indication: ECMO  Labs: Recent Labs    09/24/19 1555 09/24/19 1746 09/25/19 0337 09/25/19 0739 09/25/19 1337 09/25/19 1433 09/25/19 1433 09/25/19 1630 09/25/19 1633 09/25/19 2113  HGB 7.1*   < > 8.3*   < >  --  8.8*   < > 8.4* 9.5*  --   HCT 24.8*   < > 28.4*   < >  --  26.0*  --  29.0* 28.0*  --   PLT 114*  --  113*  --   --   --   --  114*  --   --   APTT 63*  --  51*  --   --   --   --  55*  --   --   HEPARINUNFRC 0.30   < > 0.15*  --  0.14*  --   --   --   --  0.22*  CREATININE 0.90  --  0.92  --   --   --   --  0.82  --   --    < > = values in this interval not displayed.    Assessment: 44yo male on VV- ECMO. Repeat heparin level low but trending up to 0.22. No bleeding issues noted by nursing.  Goal of Therapy:  Heparin level 0.3-0.5 units/ml (aiming for lowest end of range)    Plan:  -Increase heparin to 1850 units/h -Recheck heparin level at [place], PharmD, BCPS Clinical Pharmacist [phone] Please check AMION for all [place] numbers 09/25/2019

## 2019-09-25 NOTE — Progress Notes (Signed)
Patient ID: [name], male   DOB: [birth year], 44 y.o.   MRN: [id number]    Advanced Heart Failure Rounding Note   Subjective:    Remains on VV ecmo.  BAL growing > 100K serratia. On meropenum and vanc  CVP 8-9  Sweep 13-> 10  On vent at 40% TVs > 300-400cc  CXR with diffuse infiltrates. Mildly improved. Crescent catheter well positioned Personally reviewed  Will arouse on vent but not reliably follow commands.   ABG 7.41/349/60/91%    Co-ox 77%  hgb 8.3  Sodium 1467  BUN 138 -> 127 -> 122-> 108 Cr 0.92 On IVF and FW  ECMO parameters - see separate ecmo note   Objective:   Weight Range:  Vital Signs:   Temp:  [98.2 F (36.8 C)-99.2 F (37.3 C)] 98.2 F (36.8 C) (02/17 0307) Pulse Rate:  [83-107] 87 (02/17 0732) Resp:  [19-36] 22 (02/17 0732) BP: (123-192)/(51-68) 172/61 (02/17 0504) SpO2:  [85 %-100 %] 97 % (02/17 0732) Arterial Line BP: (115-206)/(44-156) 174/66 (02/17 0715) FiO2 (%):  [40 %-60 %] 40 % (02/17 0732) Weight:  [134.3 kg] 134.3 kg (02/17 0500) Last BM Date: 09/23/19  Weight change: Filed Weights   09/23/19 0600 09/24/19 0500 09/25/19 0500  Weight: 131 kg 132.4 kg 134.3 kg    Intake/Output:   Intake/Output Summary (Last 24 hours) at 09/25/2019 0809 Last data filed at 09/25/2019 0800 Gross per 24 hour  Intake 5317.2 ml  Output 4075 ml  Net 1242.2 ml     Physical Exam: General: Sedated on vent Will arouse to voice HEENT: normal Neck: supple. Cannula in RIJ Carotids 2+ bilat; no bruits. No lymphadenopathy or thryomegaly appreciated. Cor: PMI nondisplaced. Regular rate & rhythm. No rubs, gallops or murmurs. Lungs: improved air movement. Mild crackles Abdomen: soft, nontender, nondistended. No hepatosplenomegaly. No bruits or masses. Good bowel sounds. Extremities: no cyanosis, clubbing, rash, 1+ edema + DTIs Neuro: alert & orientedx3, cranial nerves grossly intact. moves all 4 extremities w/o difficulty. Affect  pleasant   Telemetry: Sinus 80-90s Personally reviewed   Labs: Basic Metabolic Panel: Recent Labs  Lab 09/21/19 0439 09/21/19 0447 09/22/19 0424 09/22/19 0433 09/23/19 0425 09/23/19 0452 09/23/19 1641 09/23/19 1651 09/24/19 0402 09/24/19 0410 09/24/19 1555 09/24/19 1746 09/24/19 1947 09/25/19 0333 09/25/19 0337  NA 147*   < > 148*   < > 152*   < > 151*   < > 146*   < > 148* 150* 148* 150* 147*  K 3.6   < > 3.8   < > 4.7   < > 4.6   < > 5.0   < > 4.6 4.5 4.4 4.1 4.1  CL 99   < > 105   < > 112*  --  112*  --  108  --  112*  --   --   --  110  CO2 36*   < > 32   < > 29  --  27  --  27  --  27  --   --   --  28  GLUCOSE 116*   < > 181*   < > 235*  --  242*  --  317*  --  336*  --   --   --  301*  BUN 134*   < > 138*   < > 127*  --  125*  --  122*  --  114*  --   --   --  108*  CREATININE 1.01   < > 1.00   < > 1.02  --  0.94  --  0.97  --  0.90  --   --   --  0.92  CALCIUM 9.1   < > 9.0   < > 9.2   < > 9.1   < > 9.1  --  9.3  --   --   --  9.2  MG 2.4  --  2.5*  --  2.5*  --   --   --  2.4  --   --   --   --   --  2.3  PHOS 3.9  --  3.7  --  3.0  --   --   --  3.4  --   --   --   --   --  3.6   < > = values in this interval not displayed.    Liver Function Tests: No results for input(s): AST, ALT, ALKPHOS, BILITOT, PROT, ALBUMIN in the last 168 hours. No results for input(s): LIPASE, AMYLASE in the last 168 hours. No results for input(s): AMMONIA in the last 168 hours.  CBC: Recent Labs  Lab 09/23/19 0425 09/23/19 0452 09/23/19 1641 09/23/19 1651 09/24/19 0402 09/24/19 0410 09/24/19 1555 09/24/19 1746 09/24/19 1947 09/25/19 0333 09/25/19 0337  WBC 12.8*  --  20.7*  --  21.4*  --  17.4*  --   --   --  14.4*  HGB 8.2*   < > 8.6*   < > 7.4*   < > 7.1* 8.5* 7.5* 8.2* 8.3*  HCT 28.7*   < > 30.4*   < > 26.1*   < > 24.8* 25.0* 22.0* 24.0* 28.4*  MCV 96.0  --  98.7  --  96.7  --  96.9  --   --   --  96.3  PLT 151  --  155  --  125*  --  114*  --   --   --  113*   < >  = values in this interval not displayed.    Cardiac Enzymes: No results for input(s): CKTOTAL, CKMB, CKMBINDEX, TROPONINI in the last 168 hours.  BNP: BNP (last 3 results) Recent Labs    07/22/19 1039  BNP 15.3    ProBNP (last 3 results) No results for input(s): PROBNP in the last 8760 hours.    Other results:  Imaging: DG CHEST PORT 1 VIEW  Result Date: 09/25/2019 CLINICAL DATA:  Echo mass. Tracheostomy tube. EXAM: PORTABLE CHEST 1 VIEW COMPARISON:  Radiograph yesterday. FINDINGS: Tracheostomy tube tip at the thoracic inlet. Right internal jugular ECMO catheter extends in the upper abdomen, tip not included in the field of view. Right upper extremity PICC tip partially obscured by ECMO catheter, likely in the SVC. Patchy bilateral lung opacities with slight improvement at the right lung base from prior exam. Unchanged heart size and mediastinal contours. No visualized pneumothorax. IMPRESSION: 1. Patchy bilateral lung opacities with slight improvement at the right lung base from prior exam. 2. Stable support apparatus. Electronically Signed   By: Keith Rake M.D.   On: 09/25/2019 06:49   DG CHEST PORT 1 VIEW  Result Date: 09/24/2019 CLINICAL DATA:  Ventilator, ECMO EXAM: PORTABLE CHEST 1 VIEW COMPARISON:  09/23/2019 FINDINGS: Tracheostomy, right PICC line, ECMO catheter remain in place, unchanged. Cardiomegaly. Severe diffuse bilateral airspace disease is unchanged. Low lung volumes. IMPRESSION: Severe bilateral airspace disease, unchanged. Cardiomegaly, stable. Support devices stable. Electronically Signed  By: Rolm Baptise M.D.   On: 09/24/2019 08:36   DG CHEST PORT 1 VIEW  Result Date: 09/23/2019 CLINICAL DATA:  Chest tube removal. EXAM: PORTABLE CHEST 1 VIEW COMPARISON:  Chest x-ray from same day at 0508 hours. FINDINGS: Interval removal of the right chest tube. No pneumothorax. Unchanged tracheostomy tube, right IJ ECMO catheter, and right upper extremity PICC line. Stable  cardiomediastinal silhouette. Unchanged patchy bilateral airspace disease. No pleural effusion. IMPRESSION: 1. Removal of the right chest tube.  No pneumothorax. 2. Unchanged bilateral airspace disease. Electronically Signed   By: Titus Dubin M.D.   On: 09/23/2019 13:23     Medications:     Scheduled Medications: . amiodarone  200 mg Oral Daily  . vitamin C  500 mg Per Tube Daily  . chlorhexidine gluconate (MEDLINE KIT)  15 mL Mouth Rinse BID  . Chlorhexidine Gluconate Cloth  6 each Topical Daily  . cloNIDine  0.1 mg Oral Q6H  . diazepam  5 mg Per Tube Q12H  . feeding supplement (OSMOLITE 1.5 CAL)  237 mL Per Tube 6 X Daily  . feeding supplement (PRO-STAT SUGAR FREE 64)  60 mL Per Tube QID  . free water  400 mL Per Tube Q6H  . gabapentin  100 mg Per Tube Q8H   Followed by  . [START ON 09/27/2019] gabapentin  100 mg Per Tube Q12H  . HYDROmorphone  4 mg Oral Q6H   Followed by  . [START ON 09/28/2019] HYDROmorphone  2 mg Oral Q6H  . insulin aspart  0-20 Units Subcutaneous Q4H  . insulin aspart  5 Units Subcutaneous 6 X Daily  . insulin detemir  15 Units Subcutaneous BID  . ipratropium-albuterol  3 mL Nebulization Q4H  . mouth rinse  15 mL Mouth Rinse 10 times per day  . montelukast  10 mg Per Tube QHS  . nutrition supplement (JUVEN)  1 packet Per Tube BID BM  . pantoprazole sodium  40 mg Per Tube BID  . propranolol  40 mg Per Tube TID  . psyllium  1 packet Oral BID  . QUEtiapine  50 mg Per Tube QHS  . sodium chloride flush  10-40 mL Intracatheter Q12H  . sodium chloride flush  10-40 mL Intracatheter Q12H  . tobramycin (PF)  300 mg Nebulization BID  . white petrolatum   Topical BID    Infusions: . sodium chloride    . sodium chloride 1,000 mL (09/25/19 0759)  . sodium chloride 10 mL/hr at 09/21/19 1716  . dexmedetomidine (PRECEDEX) IV infusion Stopped (09/23/19 1336)  . heparin 1,600 Units/hr (09/25/19 0600)  . meropenem (MERREM) IV Stopped (09/25/19 0536)  . vancomycin  1,000 mg (09/25/19 0800)  . vasopressin (PITRESSIN) infusion - *FOR SHOCK* Stopped (09/24/19 0917)    PRN Medications: Place/Maintain arterial line **AND** sodium chloride, sodium chloride, sodium chloride, acetaminophen (TYLENOL) oral liquid 160 mg/5 mL, acetaminophen, hydrALAZINE, [COMPLETED] HYDROmorphone **FOLLOWED BY** [COMPLETED] HYDROmorphone **FOLLOWED BY** HYDROmorphone **FOLLOWED BY** [START ON 09/28/2019] HYDROmorphone **FOLLOWED BY** [START ON 10/01/2019] HYDROmorphone, magic mouthwash, magnesium hydroxide, midazolam, ondansetron (ZOFRAN) IV, senna-docusate, sodium chloride, sodium chloride flush   Assessment/Plan:    1. Acute hypoxic/hypercarbic respiratory failure due to COVID PNA: Remained markedly hypoxic despite full support. Intubated 12/26. S/p bilateral CTs 12/26 for pneumothoraces.  TEE on 12/26 LVEF 70% RV ok. VV ECMO begun 12/26. Tracheostomy 12/29.  COVID ARDS at this point. ECMO circuit changed 1/8 & 1/23. Crescent cannula placed 2/2 (femoral cannuals removed) - ECMO parameters as per  ECMO note - Flows look good on Crescent - Developed PNA and sepsis on 2/15.  BAL with > 100K serratia. On mero and vanc and nebulized tobra. Can likely drop vanc - Will rest him today. - Off precedex. Wean oral valium and dilaudid. Will drop valium today - Decrease sweep as tolerate - Will repeat sweep trial today - D/w CCM would be ok with permissive hypercapnea if he can maintain pH. Have stopped diamox to faciltate metabolic compensation - Volume status stable. Continue IVF to help with azotemia. ContinueFW with hypernatremia. Weight climbing slowly. May need a little lasix tomorrow - LDH 362 - Continue heparin. Discussed dosing with PharmD personally. - Continue chest PT  2. Bilateral PTX: Due to barotrauma.  - Resolved. CTs out  3. COVID PNA: management of COVID infection per CCM. CXR with bilateral multifocal PNA progressing to likely ARDS.  - s/p 10 days of remdesivir -  12/16, 12/2 s/p tociluzimab - 12/17 s/p convalescent plasma - Decadron at 107m/daily completed 1/11 - Completed cefepime/vanc - Off Ancef  4. ID: Empiric coverage with vancomycin/cefepime initially for possible secondary bacterial PNA, course completed.   - treated with cefepime and vanc for + cx with staph epidimidis, MSSA and serratia (sensitivities ok). - developed recurrent PNA and sepsis 2/15 - BAL with > 100K serratia. On mero and vanc - Developed PNA and sepsis on 2/15.  BAL with > 100K serratia. On mero and vanc and nebulized tobra. Can likely drop vanc  5. Anemia: - hgb 10.5 -> 8.2 -> 7.5 -. 8.3  today. Transfuse as needed to keep Hgb> 8.0  7. FEN - now with G-tube.Continue TFs  8. Hypernatremia - NA 155 -> 143 -> 145 -> 142 -> 146 -> 150 -> 152 -> 148-> 147 - Continue FW and continue IVF  9. AKI - Volume status much improved with lasix gtt. BUN continues to climb. Lasix off. Now on IVF ANDFW -  Possibility of CVVHD d/w Renal. No current indication for HD. D/w Renal. Would consider CVVHD once sedation weaned and felt to be uremic  10. DTIs - WOC following.   D/w with ECMO team (ECMO coordinator/specialists, CCM, TCTS and pharmD) at bedside on multidisciplinary rounds. Continue to await ARDS recovery. Details as above.   Length of Stay: 623 CRITICAL CARE Performed by: BGlori Bickers Total critical care time: 35 minutes  Critical care time was exclusive of separately billable procedures and treating other patients.  Critical care was necessary to treat or prevent imminent or life-threatening deterioration.  Critical care was time spent personally by me (independent of midlevel providers or residents) on the following activities: development of treatment plan with patient and/or surrogate as well as nursing, discussions with consultants, evaluation of patient's response to treatment, examination of patient, obtaining history from patient or surrogate, ordering and  performing treatments and interventions, ordering and review of laboratory studies, ordering and review of radiographic studies, pulse oximetry and re-evaluation of patient's condition.    DGlori BickersMD 09/25/2019, 8:09 AM  Advanced Heart Failure Team Pager [phone](M-F; 7a - 4p)  Please contact [facility] for night-coverage after hours (4p -7a ) and weekends on [email or web address]

## 2019-09-25 NOTE — Progress Notes (Signed)
NAME:  [name], MRN:  [id number], DOB:  1976/06/18, LOS: 28 ADMISSION DATE:  07/22/2019, CONSULTATION DATE:  12/24 REFERRING MD:  Sloan Leiter, CHIEF COMPLAINT:  Dyspnea   Brief History   44 y/o male admitted 12/14 with COVID pneumonia leading to ARDS, pneumomediastinum.  Intubated 12/26, bilateral chest tubes placed.  Past Medical History  GERD HTN Asthma  Significant Hospital Events   12/14 admit with hypoxemic respiratory failure in setting of COVID-19 pneumonia 12/23 noted to have pneumomediastinum on chest x-ray 12/24PCCM consulted for evaluation 12/26 worsening hypoxemia, intubated 12/27 VV fem/fem ECMO cannulation 12/29 tracheostomy >> See bedside nursing event notes for full rundown of procedures after 12/27 1/5 epistaxis, nasal packing 1/30 improved cxr, improved lung compliance, TV 350s on 15PC  1/31 CXR increased infiltrates, +CFB     2/2 Converted to right IJ Crescent cannula. 2/3 PEG tube inserted at bedside 2/14 30 min sweep trial> paO2 105 (18 PEEP, 60% FiO2) pH 7.22, pCO2 87  Consults:  PCCM, CTS, CHF, Palliative  Procedures:  PICC 1/3 >> R radial a-line 1/20 >> R IJ ECMO cath 2/3 >> Tracheostomy 12/29 >>  Significant Diagnostic Tests:  CT chest 12/22>>extensive pneumomediastinum, multifocal patchy bilateral groundglass opacities CXR 1/25 >> improving bilateral infiltrates. ECMO cannulae in place. CXR 2/5 >> clearing bilateral infiltrates  Micro Data:  BCx2 12/14 >>negative  Sputum 12/15 >> OPF 12/28 blood > negative 12/29 > staph aureus 1/4 bronch:  1/2 acid fast smear bronch: negative 1/2 fungal cx bronch: negative1/12 BAL: serratia, staph epidermidisu 1/4 BAL > MSSA, Serratia 1/9 blood > negative 1/9 Resp culture > MSSA, serratia 1/11 serratia > negative 1/12 fungus culture > negative 1/12 BAL > serratia 1/18 BAL >> rare Serratia. 1/24 blood > negative 1/30 blood > negative 1/30 resp culture > serratia 2/1 BAL >> rare Serratia -  enterococcus 2/1 aspergillus Ag > negative 2/2 resp culture > serratia  2/2 blood > negative 2/5 urine > negative 2/9 resp: serratia  Antimicrobials:  Received remdesivir, steroids, actemra and convalescent plasma  5 days cefepime 12/24-12/29 Cefepime 1/4->1/13 vanc 1/4> 1/11 Ceftriaxone 1/14 > 1/27 vanc x1 1/20 Cefazolin 1/29 > 2/2 cefuroxim 2/2 vanc 2/2 Ceftriaxone x1 2/4  Interim history/subjective:   Increasingly awake.  Will open eyes and orient to voice.  Still no limb response.  Objective   Blood pressure (!) 172/61, pulse 88, temperature 98.4 F (36.9 C), temperature source Oral, resp. rate (!) 25, height _0  (1.727 m), weight 134.3 kg, SpO2 97 %. CVP:  [0 mmHg-14 mmHg] 11 mmHg  Vent Mode: PCV FiO2 (%):  [40 %-60 %] 40 % Set Rate:  [15 bmp] 15 bmp PEEP:  [14 cmH20-15 cmH20] 15 cmH20 Plateau Pressure:  [31 cmH20-34 cmH20] 34 cmH20   Intake/Output Summary (Last 24 hours) at 09/25/2019 1123 Last data filed at 09/25/2019 1100 Gross per 24 hour  Intake 5865.42 ml  Output 4100 ml  Net 1765.42 ml   Filed Weights   09/23/19 0600 09/24/19 0500 09/25/19 0500  Weight: 131 kg 132.4 kg 134.3 kg    Examination:  General:  In bed on vent HENT: NCAT tracheostomy in place.  No visible bleeding. PULM: , vent supported breathing.  Improving vesicular breath sounds throughout.  Axillary line bilaterally. CV: RRR, no mgr remedies warm and well-perfused. GI: BS+, soft, nontender PEG tube in place. MSK: normal bulk and tone.  Minimal posterior edema. Neuro: sedated on vent.  Will open eyes to voice.  No limb movement.  Flow (LPM):  5.00   Resolved Hospital Problem list     Assessment & Plan:  ARDS due to COVID 19, requiring mechanical ventilation and ECMO support > oxygenation has improved significantly Continue to lower sleep targeting normal ABG. Continue with PEEP at 10 driving pressure 15. Observe for spontaneous tidal volume recovery VAP prevention Trach care  per routine Continue sedation taper.  Pneumothoraces-resolved. Chest tubes have been removed.  Hypernatremia Agree with increased dose of free water, may need to increase more again on 2/16 Metamucil  AKI with azotemia, BUN slwoly coming down Monitor BMET and UOP Replace electrolytes as needed Agree with avoiding CRRT  Blood tinged GI secretions > none on 2/15 monitor  Sedation needs Acute metabolic encephalopathy> medication related Wean dilaudid and valium  Hypertension Clonidine Propranolol  Serratia pneumonia> treated Monitor for fever  Neuro muscular weakness PT consult this week> range of motion   Best practice:  Pain/Anxiety/Delirium protocol (if indicated): keep infusions off. Continue enteral agents. Dexmedetomidine and prn Versed and Dilaudid. Begin to taper enteral sedative.  VAP protocol (if indicated): Bundle in place. Respiratory support goals: Pressure control ventilation with ultra protective lung ventilation. Titrate FiO2 down again. Continue PEEP 10 Blood pressure target:  MAP>65, off pressors DVT prophylaxis: Remains on systemic heparin - target Heparin level 0.3-0.7 Nutrition Status: Nutrition Problem: Increased nutrient needs Etiology: acute illness(COVID PNA) Signs/Symptoms: estimated needs Interventions: Tube feeding, Prostat  Bolus feed. GI prophylaxis: Protonix per tube, change to BID today Fluid status goals:  Allow autoregulation.  Diurese as needed if greater than 2 L positive fluid balance. Urinary catheter: Guide hemodynamic management Glucose control: Improving hyperglycemia with increased mealtime coverage. Mobility/therapy needs: Bedrest.  Daily labs: as per ECMO protocol. Code Status: Full code  Family Communication: Wife updated 2/13 Disposition: ICU  Labs   CBC: CBC Latest Ref Rng & Units 09/25/2019 09/25/2019 09/25/2019  WBC 4.0 - 10.5 K/uL - 14.4(H) -  Hemoglobin 13.0 - 17.0 g/dL 9.5(L) 8.3(L) 8.2(L)  Hematocrit 39.0 - 52.0  % 28.0(L) 28.4(L) 24.0(L)  Platelets 150 - 400 K/uL - 113(L) -     Basic Metabolic Panel: BMP Latest Ref Rng & Units 09/25/2019 09/25/2019 09/25/2019  Glucose 70 - 99 mg/dL - 301(H) -  BUN 6 - 20 mg/dL - 108(H) -  Creatinine 0.61 - 1.24 mg/dL - 0.92 -  Sodium 135 - 145 mmol/L 150(H) 147(H) 150(H)  Potassium 3.5 - 5.1 mmol/L 4.2 4.1 4.1  Chloride 98 - 111 mmol/L - 110 -  CO2 22 - 32 mmol/L - 28 -  Calcium 8.9 - 10.3 mg/dL - 9.2 -     GFR: Estimated Creatinine Clearance: 136 mL/min (by C-G formula based on SCr of 0.92 mg/dL). Recent Labs  Lab 09/23/19 0425 09/23/19 0425 09/23/19 1641 09/23/19 2006 09/24/19 0402 09/24/19 1555 09/25/19 0337 09/25/19 0400  WBC 12.8*   < > 20.7*  --  21.4* 17.4* 14.4*  --   LATICACIDVEN 1.1  --   --  1.6 1.8  --   --  1.4   < > = values in this interval not displayed.    Liver Function Tests: No results for input(s): AST, ALT, ALKPHOS, BILITOT, PROT, ALBUMIN in the last 168 hours. No results for input(s): LIPASE, AMYLASE in the last 168 hours. No results for input(s): AMMONIA in the last 168 hours.  ABG    Component Value Date/Time   PHART 7.427 09/25/2019 0739   PCO2ART 45.3 09/25/2019 0739   PO2ART 69.0 (L) 09/25/2019 0739   HCO3  29.8 (H) 09/25/2019 0739   TCO2 31 09/25/2019 0739   ACIDBASEDEF 4.0 (H) 09/16/2019 2334   O2SAT 94.0 09/25/2019 0739     Coagulation Profile: No results for input(s): INR, PROTIME in the last 168 hours.  Cardiac Enzymes: No results for input(s): CKTOTAL, CKMB, CKMBINDEX, TROPONINI in the last 168 hours.  HbA1C: Hgb A1c MFr Bld  Date/Time Value Ref Range Status  09/23/2019 08:58 AM 5.5 4.8 - 5.6 % Final    Comment:    (NOTE) Pre diabetes:          5.7%-6.4% Diabetes:              >6.4% Glycemic control for   <7.0% adults with diabetes   07/24/2019 04:50 AM 6.7 (H) 4.8 - 5.6 % Final    Comment:    (NOTE) Pre diabetes:          5.7%-6.4% Diabetes:              >6.4% Glycemic control for    <7.0% adults with diabetes     CBG: Recent Labs  Lab 09/24/19 1129 09/24/19 1534 09/24/19 1945 09/24/19 2314 09/25/19 0306  GLUCAP 328* 279* 289* 273* 262*     CRITICAL CARE Performed by: Kipp Brood   Total critical care time: 40 minutes  Critical care time was exclusive of separately billable procedures and treating other patients.  Critical care was necessary to treat or prevent imminent or life-threatening deterioration.  Critical care was time spent personally by me on the following activities: development of treatment plan with patient and/or surrogate as well as nursing, discussions with consultants, evaluation of patient's response to treatment, examination of patient, obtaining history from patient or surrogate, ordering and performing treatments and interventions, ordering and review of laboratory studies, ordering and review of radiographic studies, pulse oximetry, re-evaluation of patient's condition and participation in multidisciplinary rounds.  Kipp Brood, MD [facility] ICU Physician [place]  Pager: [phone] Mobile: [phone] After hours: [phone].

## 2019-09-25 NOTE — Progress Notes (Signed)
Inpatient Diabetes Program Recommendations  AACE/ADA: New Consensus Statement on Inpatient Glycemic Control (2015)  Target Ranges:  Prepandial:   less than 140 mg/dL      Peak postprandial:   less than 180 mg/dL (1-2 hours)      Critically ill patients:  140 - 180 mg/dL   Lab Results  Component Value Date   GLUCAP 262 (H) 09/25/2019   HGBA1C 5.5 09/23/2019    Review of Glycemic Control Results for [name], [name] (MRN [id number]) as of 09/25/2019 08:59  Ref. Range 09/24/2019 15:34 09/24/2019 19:45 09/24/2019 23:14 09/25/2019 03:06  Glucose-Capillary Latest Ref Range: 70 - 99 mg/dL 279 (H) 289 (H) 273 (H) 262 (H)   Outpatient Diabetes medications: none Current orders for Inpatient glycemic control: Novolog 5 units Q4H, Novolog 0-20 units Q4H, Levemir 15 units BID Osmolite 1.5 cal Q4H  Inpatient Diabetes Program Recommendations:    May want to consider adjusting times to match tube feeding bolus times. Thus, Novolog tube feed bolus coverage: Novolog 5 units Q4 hours for: 12am, 4am, 8am, 12pm, 4pm, 8pm  HOLD if tube feed boluses HELD for any reason  Thanks, Bronson Curb, MSN, RNC-OB Diabetes Coordinator [phone] (8a-5p)

## 2019-09-25 NOTE — Progress Notes (Signed)
ANTICOAGULATION CONSULT NOTE - Follow Up Consult  Pharmacy Consult for heparin Indication: ECMO  Labs: Recent Labs    09/24/19 0402 09/24/19 0410 09/24/19 1555 09/24/19 1746 09/25/19 0337 09/25/19 0337 09/25/19 0739 09/25/19 1337 09/25/19 1433  HGB 7.4*   < > 7.1*   < > 8.3*   < > 9.5*  --  8.8*  HCT 26.1*   < > 24.8*   < > 28.4*  --  28.0*  --  26.0*  PLT 125*  --  114*  --  113*  --   --   --   --   APTT 104*  --  63*  --  51*  --   --   --   --   HEPARINUNFRC 0.46   < > 0.30  --  0.15*  --   --  0.14*  --   CREATININE 0.97  --  0.90  --  0.92  --   --   --   --    < > = values in this interval not displayed.    Assessment: 44yo male on VV- ECMO.  Heparin level this morning low, but attributed to being drawn while blood running.  Repeat level this afternoon remains low.  Spoke with nurse, bleeding from trach   Goal of Therapy:  Heparin level 0.3-0.5 units/ml (aiming for lowest end of range)    Plan:  -Increase heparin to 1700 units/hr -Repeat heparin level in 6 hrs. -Monitor heparin levels q12h, CBC, LDH, fibrinogen, and s/sx of bleeding   [name], [facility] Clinical Pharmacist Phone [phone]  09/25/2019 3:06 PM

## 2019-09-25 NOTE — CV Procedure (Signed)
   ECMO NOTE:  Indication: Respiratory failure due to COVID PNA  Initial cannulation date: 08/03/2019  ECMO type: VV ECMO (Cardio-Help)  Dual lumen  Inflow/return cannula:  1) 32 FR Crescent placed 2/2  ECMO events:  Initial cannulation 12/26 Circuit changed and trach exchanged on 1/8.   Underwent addition of a RIJ drainage cannula on 1/20 Circuit changed on 1/22 for rising LDH Had evidence of recirculation and RIJ cannula pulled back on 1/26 19 FR RIJ removed 09/06/19 Underwent change to [facility] catheter with removal of femoral lines 2/2 Sweep trial 2/14. 30 mins  PCO2 55 -> 75  Daily data:  Remains on VV ecmo with good flows.    Flow 5.06 RPM 3600 dP 37 Pven  -80 Sweep  10 SVO2 71%  Labs:  Hgb 8.3 LDH 362 Heparin level: 0.82 Lactic acid 1.4  Plan:  Crescent cannula working well Excellent flow and sats. Developed recurrent PNA and sepsis 2/15. Back on broad spectrum abx and VP Cx with serratia. Continue meropenum and inhaled tobra. Can likely stop vanc Continue to wean sweep.  Sweep trial today    Glori Bickers, MD  8:34 AM

## 2019-09-26 ENCOUNTER — Inpatient Hospital Stay (HOSPITAL_COMMUNITY): Payer: Managed Care, Other (non HMO)

## 2019-09-26 LAB — HEPARIN LEVEL (UNFRACTIONATED)
Heparin Unfractionated: 0.21 IU/mL — ABNORMAL LOW (ref 0.30–0.70)
Heparin Unfractionated: 0.36 IU/mL (ref 0.30–0.70)

## 2019-09-26 LAB — POCT I-STAT 7, (LYTES, BLD GAS, ICA,H+H)
Acid-Base Excess: 6 mmol/L — ABNORMAL HIGH (ref 0.0–2.0)
Acid-Base Excess: 6 mmol/L — ABNORMAL HIGH (ref 0.0–2.0)
Acid-Base Excess: 5 mmol/L — ABNORMAL HIGH (ref 0.0–2.0)
Acid-Base Excess: 6 mmol/L — ABNORMAL HIGH (ref 0.0–2.0)
Acid-Base Excess: 7 mmol/L — ABNORMAL HIGH (ref 0.0–2.0)
Acid-Base Excess: 7 mmol/L — ABNORMAL HIGH (ref 0.0–2.0)
Acid-Base Excess: 5 mmol/L — ABNORMAL HIGH (ref 0.0–2.0)
Bicarbonate: 29.5 mmol/L — ABNORMAL HIGH (ref 20.0–28.0)
Bicarbonate: 30.3 mmol/L — ABNORMAL HIGH (ref 20.0–28.0)
Bicarbonate: 31.3 mmol/L — ABNORMAL HIGH (ref 20.0–28.0)
Bicarbonate: 32.3 mmol/L — ABNORMAL HIGH (ref 20.0–28.0)
Bicarbonate: 32.7 mmol/L — ABNORMAL HIGH (ref 20.0–28.0)
Bicarbonate: 32.2 mmol/L — ABNORMAL HIGH (ref 20.0–28.0)
Bicarbonate: 30.2 mmol/L — ABNORMAL HIGH (ref 20.0–28.0)
Calcium, Ion: 1.29 mmol/L (ref 1.15–1.40)
Calcium, Ion: 1.29 mmol/L (ref 1.15–1.40)
Calcium, Ion: 1.29 mmol/L (ref 1.15–1.40)
Calcium, Ion: 1.34 mmol/L (ref 1.15–1.40)
Calcium, Ion: 1.36 mmol/L (ref 1.15–1.40)
Calcium, Ion: 1.29 mmol/L (ref 1.15–1.40)
Calcium, Ion: 1.3 mmol/L (ref 1.15–1.40)
HCT: 28 % — ABNORMAL LOW (ref 39.0–52.0)
HCT: 28 % — ABNORMAL LOW (ref 39.0–52.0)
HCT: 31 % — ABNORMAL LOW (ref 39.0–52.0)
HCT: 31 % — ABNORMAL LOW (ref 39.0–52.0)
HCT: 33 % — ABNORMAL LOW (ref 39.0–52.0)
HCT: 29 % — ABNORMAL LOW (ref 39.0–52.0)
HCT: 28 % — ABNORMAL LOW (ref 39.0–52.0)
Hemoglobin: 9.5 g/dL — ABNORMAL LOW (ref 13.0–17.0)
Hemoglobin: 9.5 g/dL — ABNORMAL LOW (ref 13.0–17.0)
Hemoglobin: 10.5 g/dL — ABNORMAL LOW (ref 13.0–17.0)
Hemoglobin: 10.5 g/dL — ABNORMAL LOW (ref 13.0–17.0)
Hemoglobin: 11.2 g/dL — ABNORMAL LOW (ref 13.0–17.0)
Hemoglobin: 9.9 g/dL — ABNORMAL LOW (ref 13.0–17.0)
Hemoglobin: 9.5 g/dL — ABNORMAL LOW (ref 13.0–17.0)
O2 Saturation: 97 %
O2 Saturation: 96 %
O2 Saturation: 93 %
O2 Saturation: 98 %
O2 Saturation: 99 %
O2 Saturation: 99 %
O2 Saturation: 98 %
Patient temperature: 36.9
Patient temperature: 98.9
Patient temperature: 98.9
Patient temperature: 98.9
Patient temperature: 98.9
Patient temperature: 99
Patient temperature: 36.9
Potassium: 4 mmol/L (ref 3.5–5.1)
Potassium: 4 mmol/L (ref 3.5–5.1)
Potassium: 3.8 mmol/L (ref 3.5–5.1)
Potassium: 4 mmol/L (ref 3.5–5.1)
Potassium: 4.1 mmol/L (ref 3.5–5.1)
Potassium: 3.9 mmol/L (ref 3.5–5.1)
Potassium: 3.8 mmol/L (ref 3.5–5.1)
Sodium: 151 mmol/L — ABNORMAL HIGH (ref 135–145)
Sodium: 152 mmol/L — ABNORMAL HIGH (ref 135–145)
Sodium: 150 mmol/L — ABNORMAL HIGH (ref 135–145)
Sodium: 152 mmol/L — ABNORMAL HIGH (ref 135–145)
Sodium: 152 mmol/L — ABNORMAL HIGH (ref 135–145)
Sodium: 152 mmol/L — ABNORMAL HIGH (ref 135–145)
Sodium: 153 mmol/L — ABNORMAL HIGH (ref 135–145)
TCO2: 31 mmol/L (ref 22–32)
TCO2: 32 mmol/L (ref 22–32)
TCO2: 33 mmol/L — ABNORMAL HIGH (ref 22–32)
TCO2: 34 mmol/L — ABNORMAL HIGH (ref 22–32)
TCO2: 35 mmol/L — ABNORMAL HIGH (ref 22–32)
TCO2: 34 mmol/L — ABNORMAL HIGH (ref 22–32)
TCO2: 32 mmol/L (ref 22–32)
pCO2 arterial: 39.4 mmHg (ref 32.0–48.0)
pCO2 arterial: 42.2 mmHg (ref 32.0–48.0)
pCO2 arterial: 44.6 mmHg (ref 32.0–48.0)
pCO2 arterial: 61.1 mmHg — ABNORMAL HIGH (ref 32.0–48.0)
pCO2 arterial: 66 mmHg (ref 32.0–48.0)
pCO2 arterial: 46 mmHg (ref 32.0–48.0)
pCO2 arterial: 46.8 mmHg (ref 32.0–48.0)
pH, Arterial: 7.482 — ABNORMAL HIGH (ref 7.350–7.450)
pH, Arterial: 7.466 — ABNORMAL HIGH (ref 7.350–7.450)
pH, Arterial: 7.299 — ABNORMAL LOW (ref 7.350–7.450)
pH, Arterial: 7.338 — ABNORMAL LOW (ref 7.350–7.450)
pH, Arterial: 7.455 — ABNORMAL HIGH (ref 7.350–7.450)
pH, Arterial: 7.454 — ABNORMAL HIGH (ref 7.350–7.450)
pH, Arterial: 7.418 (ref 7.350–7.450)
pO2, Arterial: 86 mmHg (ref 83.0–108.0)
pO2, Arterial: 82 mmHg — ABNORMAL LOW (ref 83.0–108.0)
pO2, Arterial: 127 mmHg — ABNORMAL HIGH (ref 83.0–108.0)
pO2, Arterial: 150 mmHg — ABNORMAL HIGH (ref 83.0–108.0)
pO2, Arterial: 76 mmHg — ABNORMAL LOW (ref 83.0–108.0)
pO2, Arterial: 123 mmHg — ABNORMAL HIGH (ref 83.0–108.0)
pO2, Arterial: 109 mmHg — ABNORMAL HIGH (ref 83.0–108.0)

## 2019-09-26 LAB — BASIC METABOLIC PANEL
Anion gap: 9 (ref 5–15)
Anion gap: 9 (ref 5–15)
BUN: 97 mg/dL — ABNORMAL HIGH (ref 6–20)
BUN: 95 mg/dL — ABNORMAL HIGH (ref 6–20)
CO2: 28 mmol/L (ref 22–32)
CO2: 29 mmol/L (ref 22–32)
Calcium: 9.2 mg/dL (ref 8.9–10.3)
Calcium: 9.1 mg/dL (ref 8.9–10.3)
Chloride: 114 mmol/L — ABNORMAL HIGH (ref 98–111)
Chloride: 111 mmol/L (ref 98–111)
Creatinine, Ser: 0.78 mg/dL (ref 0.61–1.24)
Creatinine, Ser: 0.83 mg/dL (ref 0.61–1.24)
GFR calc Af Amer: >60 mL/min (ref >60)
GFR calc Af Amer: >60 mL/min (ref >60)
GFR calc non Af Amer: >60 mL/min (ref >60)
GFR calc non Af Amer: >60 mL/min (ref >60)
Glucose, Bld: 231 mg/dL — ABNORMAL HIGH (ref 70–99)
Glucose, Bld: 228 mg/dL — ABNORMAL HIGH (ref 70–99)
Potassium: 4.2 mmol/L (ref 3.5–5.1)
Potassium: 3.9 mmol/L (ref 3.5–5.1)
Sodium: 151 mmol/L — ABNORMAL HIGH (ref 135–145)
Sodium: 149 mmol/L — ABNORMAL HIGH (ref 135–145)

## 2019-09-26 LAB — PHOSPHORUS: Phosphorus: 3.3 mg/dL (ref 2.5–4.6)

## 2019-09-26 LAB — CBC
HCT: 29.7 % — ABNORMAL LOW (ref 39.0–52.0)
HCT: 30.8 % — ABNORMAL LOW (ref 39.0–52.0)
Hemoglobin: 8.6 g/dL — ABNORMAL LOW (ref 13.0–17.0)
Hemoglobin: 8.9 g/dL — ABNORMAL LOW (ref 13.0–17.0)
MCH: 27.4 pg (ref 26.0–34.0)
MCH: 27.7 pg (ref 26.0–34.0)
MCHC: 29 g/dL — ABNORMAL LOW (ref 30.0–36.0)
MCHC: 28.9 g/dL — ABNORMAL LOW (ref 30.0–36.0)
MCV: 94.6 fL (ref 80.0–100.0)
MCV: 96 fL (ref 80.0–100.0)
Platelets: 125 10*3/uL — ABNORMAL LOW (ref 150–400)
Platelets: 138 10*3/uL — ABNORMAL LOW (ref 150–400)
RBC: 3.14 MIL/uL — ABNORMAL LOW (ref 4.22–5.81)
RBC: 3.21 MIL/uL — ABNORMAL LOW (ref 4.22–5.81)
RDW: 17.2 % — ABNORMAL HIGH (ref 11.5–15.5)
RDW: 17.4 % — ABNORMAL HIGH (ref 11.5–15.5)
WBC: 13.3 10*3/uL — ABNORMAL HIGH (ref 4.0–10.5)
WBC: 15 10*3/uL — ABNORMAL HIGH (ref 4.0–10.5)
nRBC: 0.2 % (ref 0.0–0.2)
nRBC: 0.2 % (ref 0.0–0.2)

## 2019-09-26 LAB — GLUCOSE, CAPILLARY
Glucose-Capillary: 213 mg/dL — ABNORMAL HIGH (ref 70–99)
Glucose-Capillary: 212 mg/dL — ABNORMAL HIGH (ref 70–99)
Glucose-Capillary: 235 mg/dL — ABNORMAL HIGH (ref 70–99)
Glucose-Capillary: 210 mg/dL — ABNORMAL HIGH (ref 70–99)
Glucose-Capillary: 236 mg/dL — ABNORMAL HIGH (ref 70–99)

## 2019-09-26 LAB — LACTATE DEHYDROGENASE: LDH: 390 U/L — ABNORMAL HIGH (ref 98–192)

## 2019-09-26 LAB — APTT
aPTT: 62 seconds — ABNORMAL HIGH (ref 24–36)
aPTT: 65 seconds — ABNORMAL HIGH (ref 24–36)

## 2019-09-26 LAB — LACTIC ACID, PLASMA: Lactic Acid, Venous: 1.3 mmol/L (ref 0.5–1.9)

## 2019-09-26 LAB — COOXEMETRY PANEL
Carboxyhemoglobin: 2.7 % — ABNORMAL HIGH (ref 0.5–1.5)
Methemoglobin: 1.3 % (ref 0.0–1.5)
O2 Saturation: 75.3 %
Total hemoglobin: 7.6 g/dL — ABNORMAL LOW (ref 12.0–16.0)

## 2019-09-26 LAB — MAGNESIUM: Magnesium: 2.2 mg/dL (ref 1.7–2.4)

## 2019-09-26 LAB — FIBRINOGEN: Fibrinogen: 604 mg/dL — ABNORMAL HIGH (ref 210–475)

## 2019-09-26 MED ORDER — FUROSEMIDE 10 MG/ML IJ SOLN
40.0000 mg | Freq: Once | INTRAMUSCULAR | Status: AC
  Administered 2019-09-26: 40 mg via INTRAVENOUS
  Filled 2019-09-26: qty 4

## 2019-09-26 MED ORDER — INSULIN ASPART 100 UNIT/ML ~~LOC~~ SOLN
5.0000 [IU] | SUBCUTANEOUS | Status: DC
  Administered 2019-09-26 – 2019-10-18 (×98): 5 [IU] via SUBCUTANEOUS

## 2019-09-26 MED ORDER — INSULIN DETEMIR 100 UNIT/ML ~~LOC~~ SOLN
18.0000 [IU] | Freq: Two times a day (BID) | SUBCUTANEOUS | Status: DC
  Administered 2019-09-26 – 2019-10-05 (×19): 18 [IU] via SUBCUTANEOUS
  Filled 2019-09-26 (×21): qty 0.18

## 2019-09-26 MED ORDER — NOREPINEPHRINE 4 MG/250ML-% IV SOLN
INTRAVENOUS | Status: AC
  Filled 2019-09-26: qty 250

## 2019-09-26 MED ORDER — DIAZEPAM 5 MG PO TABS
2.5000 mg | ORAL_TABLET | Freq: Two times a day (BID) | ORAL | Status: DC
  Administered 2019-09-26 – 2019-09-27 (×4): 2.5 mg
  Filled 2019-09-26 (×4): qty 1

## 2019-09-26 NOTE — Progress Notes (Signed)
Pt's Aline changed per protocol.  Pt had scab near catheter, site cleaned and dressed per protocol.  Site shown to [employer].  RT will continue to montior.

## 2019-09-26 NOTE — CV Procedure (Signed)
   ECMO NOTE:  Indication: Respiratory failure due to COVID PNA  Initial cannulation date: 08/03/2019  ECMO type: VV ECMO (Cardio-Help)  Dual lumen  Inflow/return cannula:  1) 32 FR Crescent placed 2/2  ECMO events:  Initial cannulation 12/26 Circuit changed and trach exchanged on 1/8.   Underwent addition of a RIJ drainage cannula on 1/20 Circuit changed on 1/22 for rising LDH Had evidence of recirculation and RIJ cannula pulled back on 1/26 19 FR RIJ removed 09/06/19 Underwent change to [facility] catheter with removal of femoral lines 2/2 Sweep trial 2/14. 30 mins  PCO2 55 -> 75  Daily data:  Remains on VV ecmo with good flows.    Flow 4.86 RPM 3600 dP 39 Pven  -85 Sweep  8 SVO2 81%  Labs:  Hgb 9.5 LDH 390 Heparin level: 0.21 Lactic acid 1.3  Plan:  Crescent cannula working well Excellent flow and sats. Developed recurrent PNA and sepsis 2/15.  Cx with serratia. Continue meropenum and inhaled tobra. Can likely stop vanc Continue to wean sweep.  Sweep trial today Keep I/Os slightly even    Glori Bickers, MD  8:34 AM

## 2019-09-26 NOTE — Progress Notes (Signed)
ANTICOAGULATION CONSULT NOTE - Follow Up Consult  Pharmacy Consult for heparin Indication: ECMO  Assessment: 44yo male on VV- ECMO.  Heparin level at goal after rate increase this morning. No bleeding issues noted by nursing.  Goal of Therapy:  Heparin level 0.3-0.5 units/ml (aiming for lowest end of range)    Plan: -Continue heparin at 2000 units/hr -Recheck heparin level in am Thanks for allowing pharmacy to be a part of this patient's care.  Erin Hearing PharmD., BCPS Clinical Pharmacist 09/26/2019 5:26 PM

## 2019-09-26 NOTE — Progress Notes (Signed)
NAME:  [name], MRN:  [id number], DOB:  [birth year], LOS: 53 ADMISSION DATE:  07/22/2019, CONSULTATION DATE:  12/24 REFERRING MD:  Sloan Leiter, CHIEF COMPLAINT:  Dyspnea   Brief History   44 y/o male admitted 12/14 with COVID pneumonia leading to ARDS, pneumomediastinum.  Intubated 12/26, bilateral chest tubes placed.  Past Medical History  GERD HTN Asthma  Significant Hospital Events   12/14 admit with hypoxemic respiratory failure in setting of COVID-19 pneumonia 12/23 noted to have pneumomediastinum on chest x-ray 12/24PCCM consulted for evaluation 12/26 worsening hypoxemia, intubated 12/27 VV fem/fem ECMO cannulation 12/29 tracheostomy >> See bedside nursing event notes for full rundown of procedures after 12/27 1/5 epistaxis, nasal packing 1/30 improved cxr, improved lung compliance, TV 350s on 15PC  1/31 CXR increased infiltrates, +CFB     2/2 Converted to right IJ Crescent cannula. 2/3 PEG tube inserted at bedside 2/14 30 min sweep trial> paO2 105 (18 PEEP, 60% FiO2) pH 7.22, pCO2 87  Consults:  PCCM, CTS, CHF, Palliative  Procedures:  PICC 1/3 >> R radial a-line 1/20 >> R IJ ECMO cath 2/3 >> Tracheostomy 12/29 >>  Significant Diagnostic Tests:  CT chest 12/22>>extensive pneumomediastinum, multifocal patchy bilateral groundglass opacities CXR 1/25 >> improving bilateral infiltrates. ECMO cannulae in place. CXR 2/5 >> clearing bilateral infiltrates  Micro Data:  BCx2 12/14 >>negative  Sputum 12/15 >> OPF 12/28 blood > negative 12/29 > staph aureus 1/4 bronch:  1/2 acid fast smear bronch: negative 1/2 fungal cx bronch: negative1/12 BAL: serratia, staph epidermidisu 1/4 BAL > MSSA, Serratia 1/9 blood > negative 1/9 Resp culture > MSSA, serratia 1/11 serratia > negative 1/12 fungus culture > negative 1/12 BAL > serratia 1/18 BAL >> rare Serratia. 1/24 blood > negative 1/30 blood > negative 1/30 resp culture > serratia 2/1 BAL >> rare Serratia -  enterococcus 2/1 aspergillus Ag > negative 2/2 resp culture > serratia  2/2 blood > negative 2/5 urine > negative 2/9 resp: serratia  Antimicrobials:  Received remdesivir, steroids, actemra and convalescent plasma  5 days cefepime 12/24-12/29 Cefepime 1/4->1/13 vanc 1/4> 1/11 Ceftriaxone 1/14 > 1/27 vanc x1 1/20 Cefazolin 1/29 > 2/2 cefuroxim 2/2 vanc 2/2 Ceftriaxone x1 2/4  Interim history/subjective:   Remains critically ill in the intensive care unit on a VV ECMO. patient will open eyes to significant stimulation and will attempt to look at you.  Does not have any purposeful movements this morning.  Objective   Blood pressure (!) 171/68, pulse 93, temperature 98.9 F (37.2 C), temperature source Oral, resp. rate (!) 32, height _0  (1.727 m), weight 132.1 kg, SpO2 97 %. CVP:  [7 mmHg-9 mmHg] 9 mmHg  Vent Mode: PCV FiO2 (%):  [40 %] 40 % Set Rate:  [15 bmp] 15 bmp PEEP:  [15 cmH20] 15 cmH20 Plateau Pressure:  [28 cmH20-37 cmH20] 37 cmH20   Intake/Output Summary (Last 24 hours) at 09/26/2019 0859 Last data filed at 09/26/2019 0800 Gross per 24 hour  Intake 5354.73 ml  Output 3740 ml  Net 1614.73 ml   Filed Weights   09/24/19 0500 09/25/19 0500 09/26/19 0600  Weight: 132.4 kg 134.3 kg 132.1 kg    Examination:  General: In bed tracheostomy tube in place on mechanical ventilation, VV ECMO in place HENT: NCAT, tracheostomy tube in place, right internal jugular VV ECMO cannula PULM: Bilateral ventilated breath sounds still with some abdominal breathing CV: Good rate rhythm, S1-S2 perfused extremities GI: Bowel sounds present, soft nontender PEG tube in place no  drainage from site MSK: Dependent edema bilaterally Neuro: Sedated on mechanical ventilation will open eyes to voice  Flow (LPM): 5.04   Resolved Hospital Problem list     Assessment & Plan:   ARDS due to COVID 19, requiring mechanical ventilation and VV ECMO  -Plan for 30-minute sleep trial again  today around 11 AM. -Attempt to maintain driving pressure 15, low tidal volume ventilation strategy -Continue to observe for improving spontaneous tidal volume recovery while on support -VAT prevention -Routine trach care as needed -Continue tapering sedation as tolerated -Cut back Valium today to twice daily dosing  Pneumothoraces-resolved. -Chest tubes removed  Hypernatremia -Free water replacement  AKI with azotemia Positive cumulative fluid balance BUN is slowly improving -Discussed with cardiology at bedside -48 of IV Lasix again today  Blood tinged GI secretions > none on 2/15 -Continue to monitor no evidence of active bleeding  Sedation needs Weaning sedation needs daily  Hypertension Continue clonidine propanolol  Serratia pneumonia> treated 7-day stop date meropenem Twice daily tobramycin nebs   Neuro muscular weakness PT following  Bilateral lower feet deep tissue injury -Wound care following   Best practice:  Pain/Anxiety/Delirium protocol (if indicated): Attempting to keep off continuous confusions.  If needed will restart Precedex first, weaning from Dilaudid VAP protocol (if indicated): Bundle in place. Respiratory support goals: Lung protective ventilation strategy Blood pressure target: Vasopressors at this time wean arterial pressure greater than 65 DVT prophylaxis: Systemic heparin Nutrition Status: Nutrition Problem: Increased nutrient needs Etiology: acute illness(COVID PNA) Signs/Symptoms: estimated needs Interventions: Tube feeding, Prostat   bolus feeds continued GI prophylaxis: Protonix per tube, change to BID today Fluid status goals:  Allow autoregulation.  Diurese as needed if greater than 2 L positive fluid balance. Urinary catheter: Guide hemodynamic management Glucose control: Improving hyperglycemia with increased mealtime coverage. Mobility/therapy needs: Bedrest.  Daily labs: as per ECMO protocol. Code Status: Full code   Family Communication: Wife updated 2/13 Disposition: ICU  Labs   CBC: CBC Latest Ref Rng & Units 09/26/2019 09/26/2019 09/26/2019  WBC 4.0 - 10.5 K/uL - - 13.3(H)  Hemoglobin 13.0 - 17.0 g/dL 9.5(L) 9.5(L) 8.6(L)  Hematocrit 39.0 - 52.0 % 28.0(L) 28.0(L) 29.7(L)  Platelets 150 - 400 K/uL - - 125(L)     Basic Metabolic Panel: BMP Latest Ref Rng & Units 09/26/2019 09/26/2019 09/26/2019  Glucose 70 - 99 mg/dL - - 231(H)  BUN 6 - 20 mg/dL - - 97(H)  Creatinine 0.61 - 1.24 mg/dL - - 0.78  Sodium 135 - 145 mmol/L 152(H) 151(H) 151(H)  Potassium 3.5 - 5.1 mmol/L 4.0 4.0 4.2  Chloride 98 - 111 mmol/L - - 114(H)  CO2 22 - 32 mmol/L - - 28  Calcium 8.9 - 10.3 mg/dL - - 9.2     GFR: Estimated Creatinine Clearance: 154.9 mL/min (by C-G formula based on SCr of 0.78 mg/dL). Recent Labs  Lab 09/23/19 1641 09/23/19 2006 09/24/19 0402 09/24/19 0402 09/24/19 1555 09/25/19 0337 09/25/19 0400 09/25/19 1630 09/26/19 0433  WBC   < >  --  21.4*   < > 17.4* 14.4*  --  15.0* 13.3*  LATICACIDVEN  --  1.6 1.8  --   --   --  1.4  --  1.3   < > = values in this interval not displayed.    Liver Function Tests: No results for input(s): AST, ALT, ALKPHOS, BILITOT, PROT, ALBUMIN in the last 168 hours. No results for input(s): LIPASE, AMYLASE in the last 168 hours. No  results for input(s): AMMONIA in the last 168 hours.  ABG    Component Value Date/Time   PHART 7.466 (H) 09/26/2019 0719   PCO2ART 42.2 09/26/2019 0719   PO2ART 82.0 (L) 09/26/2019 0719   HCO3 30.3 (H) 09/26/2019 0719   TCO2 32 09/26/2019 0719   ACIDBASEDEF 4.0 (H) 09/16/2019 2334   O2SAT 96.0 09/26/2019 0719     Coagulation Profile: No results for input(s): INR, PROTIME in the last 168 hours.  Cardiac Enzymes: No results for input(s): CKTOTAL, CKMB, CKMBINDEX, TROPONINI in the last 168 hours.  HbA1C: Hgb A1c MFr Bld  Date/Time Value Ref Range Status  09/23/2019 08:58 AM 5.5 4.8 - 5.6 % Final    Comment:    (NOTE) Pre  diabetes:          5.7%-6.4% Diabetes:              >6.4% Glycemic control for   <7.0% adults with diabetes   07/24/2019 04:50 AM 6.7 (H) 4.8 - 5.6 % Final    Comment:    (NOTE) Pre diabetes:          5.7%-6.4% Diabetes:              >6.4% Glycemic control for   <7.0% adults with diabetes     CBG: Recent Labs  Lab 09/25/19 1152 09/25/19 1511 09/25/19 1955 09/25/19 2334 09/26/19 0438  GLUCAP 218* 212* 248* 253* 213*   This patient is critically ill with multiple organ system failure; which, requires frequent high complexity decision making, assessment, support, evaluation, and titration of therapies. This was completed through the application of advanced monitoring technologies and extensive interpretation of multiple databases. During this encounter critical care time was devoted to patient care services described in this note for 52 minutes.   [facility] 09/26/2019 8:59 AM

## 2019-09-26 NOTE — Progress Notes (Signed)
Sweep Trial   Myself, the bedside RN and ECMO coordinator @ bedside.  Ventilator optimized at FIO2 60%, Peep 15, R 20, PC 15 for 10 minutes prior to drawing pre sweep trial ABG @ 1134. Sweep trail began at 1135.  ABG drawn at 10 minutes, see 1152 ABG results.  FiO2 on the ventilator decreased to 50% 17 minutes into the sweep trial.  ABG drawn 22 minutes into trail, see results at 1200.  O2 sats on minitor dropped from 98% to 92% during the trial.  Pt's RR started at 30 and at the end of the trial pt RR was 41, WOB increased, but TV maintained around 400.   Sweep trail lasted 25 minutes.  See Gas results below.  FiO2 at 50% for now while pt recovers.  RT asked to drop sweep back to 40% as pt tolerates.  Sweep returned to 8 at this time.  Pt tolerated trial fairly well.      Results for [name], [name] (MRN [id number]) as of 09/26/2019 16:48  Ref. Range 09/26/2019 11:34 09/26/2019 11:52 09/26/2019 12:00  Sample type Unknown CARDIOPULMONARY BYPASS CARDIOPULMONARY BYPASS CARDIOPULMONARY BYPASS  pH, Arterial Latest Ref Range: 7.350 - 7.450  7.455 (H) 7.338 (L) 7.299 (L)  pCO2 arterial Latest Ref Range: 32.0 - 48.0 mmHg 44.6 61.1 (H) 66.0 (HH)  pO2, Arterial Latest Ref Range: 83.0 - 108.0 mmHg 150.0 (H) 127.0 (H) 76.0 (L)  TCO2 Latest Ref Range: 22 - 32 mmol/L 33 (H) 35 (H) 34 (H)  Acid-Base Excess Latest Ref Range: 0.0 - 2.0 mmol/L 7.0 (H) 6.0 (H) 5.0 (H)  Bicarbonate Latest Ref Range: 20.0 - 28.0 mmol/L 31.3 (H) 32.7 (H) 32.3 (H)  O2 Saturation Latest Units: % 99.0 98.0 93.0  Patient temperature Unknown 98.9 F 98.9 F 98.9 F

## 2019-09-26 NOTE — Progress Notes (Signed)
Patient ID: [name], male   DOB: 01/20/1976, 44 y.o.   MRN: [id number]    Advanced Heart Failure Rounding Note   Subjective:    Remains on VV ecmo.  BAL growing > 100K serratia. On meropenum and vanc  CVP 8-9  Sweep 13-> 10 -> 9 -> 8  On vent at 40% TVs > 300-400cc  CXR with diffuse infiltrates. Mildly improved today. Crescent catheter well positioned Personally reviewed  Will arouse on vent but not reliably follow commands for me. RN reports he followed some commands.  ABG 7.48/33/86/97%    Co-ox 75%  LDH 390  hgb 9.5  Sodium 152  BUN 138 -> 127 -> 122-> 108 -> 97 Cr 0.78 On IVF and FW  ECMO parameters - see separate ecmo note   Objective:   Weight Range:  Vital Signs:   Temp:  [98.5 F (36.9 C)-100.3 F (37.9 C)] 98.9 F (37.2 C) (02/18 1200) Pulse Rate:  [83-96] 88 (02/18 1200) Resp:  [19-38] 38 (02/18 1200) BP: (171-179)/(68) 179/68 (02/18 1146) SpO2:  [93 %-100 %] 93 % (02/18 1200) Arterial Line BP: (137-193)/(52-71) 174/64 (02/18 1200) FiO2 (%):  [40 %-60 %] 50 % (02/18 1200) Weight:  [132.1 kg] 132.1 kg (02/18 0600) Last BM Date: 09/25/19  Weight change: Filed Weights   09/24/19 0500 09/25/19 0500 09/26/19 0600  Weight: 132.4 kg 134.3 kg 132.1 kg    Intake/Output:   Intake/Output Summary (Last 24 hours) at 09/26/2019 1229 Last data filed at 09/26/2019 1200 Gross per 24 hour  Intake 4625.63 ml  Output 4315 ml  Net 310.63 ml     Physical Exam: General: Sedated on vent Will arouse to voice HEENT: normal Neck: supple. RIJ cannula + trach Carotids 2+ bilat; no bruits. No lymphadenopathy or thryomegaly appreciated. Cor: PMI nondisplaced. Regular rate & rhythm. No rubs, gallops or murmurs. Lungs: mild crackles Abdomen: obese soft, nontender, nondistended. No hepatosplenomegaly. No bruits or masses. Good bowel sounds. Extremities: no cyanosis, clubbing, rash, 1+ edema Neuro: sedated on vent   Telemetry: Sinus 80-90s Personally  reviewed   Labs: Basic Metabolic Panel: Recent Labs  Lab 09/22/19 0424 09/22/19 0433 09/23/19 0425 09/23/19 0452 09/24/19 0402 09/24/19 0410 09/24/19 1555 09/24/19 1746 09/25/19 1517 09/25/19 0739 09/25/19 1630 09/25/19 1633 09/26/19 0433 09/26/19 0547 09/26/19 0719  NA 148*   < > 152*   < > 146*   < > 148*   < > 147*   < > 147* 150* 151* 151* 152*  K 3.8   < > 4.7   < > 5.0   < > 4.6   < > 4.1   < > 4.4 4.4 4.2 4.0 4.0  CL 105   < > 112*   < > 108  --  112*  --  110  --  111  --  114*  --   --   CO2 32   < > 29   < > 27  --  27  --  28  --  27  --  28  --   --   GLUCOSE 181*   < > 235*   < > 317*  --  336*  --  301*  --  288*  --  231*  --   --   BUN 138*   < > 127*   < > 122*  --  114*  --  108*  --  111*  --  97*  --   --  CREATININE 1.00   < > 1.02   < > 0.97  --  0.90  --  0.92  --  0.82  --  0.78  --   --   CALCIUM 9.0   < > 9.2   < > 9.1   < > 9.3   < > 9.2  --  9.3  --  9.2  --   --   MG 2.5*  --  2.5*  --  2.4  --   --   --  2.3  --   --   --  2.2  --   --   PHOS 3.7  --  3.0  --  3.4  --   --   --  3.6  --   --   --  3.3  --   --    < > = values in this interval not displayed.    Liver Function Tests: No results for input(s): AST, ALT, ALKPHOS, BILITOT, PROT, ALBUMIN in the last 168 hours. No results for input(s): LIPASE, AMYLASE in the last 168 hours. No results for input(s): AMMONIA in the last 168 hours.  CBC: Recent Labs  Lab 09/24/19 0402 09/24/19 0410 09/24/19 1555 09/24/19 1746 09/25/19 0337 09/25/19 0739 09/25/19 1630 09/25/19 1633 09/26/19 0433 09/26/19 0547 09/26/19 0719  WBC 21.4*  --  17.4*  --  14.4*  --  15.0*  --  13.3*  --   --   HGB 7.4*   < > 7.1*   < > 8.3*   < > 8.4* 9.5* 8.6* 9.5* 9.5*  HCT 26.1*   < > 24.8*   < > 28.4*   < > 29.0* 28.0* 29.7* 28.0* 28.0*  MCV 96.7  --  96.9  --  96.3  --  95.1  --  94.6  --   --   PLT 125*  --  114*  --  113*  --  114*  --  125*  --   --    < > = values in this interval not displayed.     Cardiac Enzymes: No results for input(s): CKTOTAL, CKMB, CKMBINDEX, TROPONINI in the last 168 hours.  BNP: BNP (last 3 results) Recent Labs    07/22/19 1039  BNP 15.3    ProBNP (last 3 results) No results for input(s): PROBNP in the last 8760 hours.    Other results:  Imaging: DG CHEST PORT 1 VIEW  Result Date: 09/26/2019 CLINICAL DATA:  ECMO. EXAM: PORTABLE CHEST 1 VIEW COMPARISON:  Radiograph yesterday. FINDINGS: Tracheostomy tube tip at the thoracic inlet. Right internal jugular ECMO catheter tip below the diaphragm not included in the field of view. Right upper extremity PICC tip in the SVC, partially obscured by ECMO catheter. Slight improvement in lung volumes from prior. Heterogeneous bilateral lung opacities, not significantly changed from prior. Stable heart size and mediastinal contours. No pneumothorax. IMPRESSION: 1. Slight improvement in lung volumes from prior. Heterogeneous bilateral lung opacities are not significantly changed. 2. Support apparatus unchanged. Electronically Signed   By: Keith Rake M.D.   On: 09/26/2019 05:26   DG CHEST PORT 1 VIEW  Result Date: 09/25/2019 CLINICAL DATA:  Echo mass. Tracheostomy tube. EXAM: PORTABLE CHEST 1 VIEW COMPARISON:  Radiograph yesterday. FINDINGS: Tracheostomy tube tip at the thoracic inlet. Right internal jugular ECMO catheter extends in the upper abdomen, tip not included in the field of view. Right upper extremity PICC tip partially obscured by ECMO catheter, likely in the SVC.  Patchy bilateral lung opacities with slight improvement at the right lung base from prior exam. Unchanged heart size and mediastinal contours. No visualized pneumothorax. IMPRESSION: 1. Patchy bilateral lung opacities with slight improvement at the right lung base from prior exam. 2. Stable support apparatus. Electronically Signed   By: Keith Rake M.D.   On: 09/25/2019 06:49     Medications:     Scheduled Medications: . amiodarone   200 mg Oral Daily  . vitamin C  500 mg Per Tube Daily  . chlorhexidine gluconate (MEDLINE KIT)  15 mL Mouth Rinse BID  . Chlorhexidine Gluconate Cloth  6 each Topical Daily  . cloNIDine  0.1 mg Oral Q6H  . diazepam  2.5 mg Per Tube Q12H  . feeding supplement (OSMOLITE 1.5 CAL)  237 mL Per Tube 6 X Daily  . feeding supplement (PRO-STAT SUGAR FREE 64)  60 mL Per Tube QID  . free water  400 mL Per Tube Q6H  . gabapentin  100 mg Per Tube Q8H   Followed by  . [START ON 09/27/2019] gabapentin  100 mg Per Tube Q12H  . HYDROmorphone  4 mg Oral Q6H   Followed by  . [START ON 09/28/2019] HYDROmorphone  2 mg Oral Q6H  . insulin aspart  0-20 Units Subcutaneous Q4H  . insulin aspart  5 Units Subcutaneous 6 X Daily  . insulin detemir  18 Units Subcutaneous BID  . ipratropium-albuterol  3 mL Nebulization Q4H  . mouth rinse  15 mL Mouth Rinse 10 times per day  . montelukast  10 mg Per Tube QHS  . nutrition supplement (JUVEN)  1 packet Per Tube BID BM  . pantoprazole sodium  40 mg Per Tube BID  . propranolol  40 mg Per Tube TID  . psyllium  1 packet Oral BID  . QUEtiapine  50 mg Per Tube QHS  . sodium chloride flush  10-40 mL Intracatheter Q12H  . sodium chloride flush  10-40 mL Intracatheter Q12H  . tobramycin (PF)  300 mg Nebulization BID  . white petrolatum   Topical BID    Infusions: . sodium chloride    . sodium chloride 10 mL/hr at 09/26/19 1200  . sodium chloride 10 mL/hr at 09/26/19 1200  . dexmedetomidine (PRECEDEX) IV infusion Stopped (09/23/19 1336)  . heparin 2,000 Units/hr (09/26/19 1200)  . meropenem (MERREM) IV Stopped (09/26/19 4818)  . vasopressin (PITRESSIN) infusion - *FOR SHOCK* Stopped (09/24/19 0917)    PRN Medications: Place/Maintain arterial line **AND** sodium chloride, sodium chloride, sodium chloride, acetaminophen (TYLENOL) oral liquid 160 mg/5 mL, acetaminophen, hydrALAZINE, [COMPLETED] HYDROmorphone **FOLLOWED BY** [COMPLETED] HYDROmorphone **FOLLOWED BY**  HYDROmorphone **FOLLOWED BY** [START ON 09/28/2019] HYDROmorphone **FOLLOWED BY** [START ON 10/01/2019] HYDROmorphone, magic mouthwash, magnesium hydroxide, midazolam, ondansetron (ZOFRAN) IV, senna-docusate, sodium chloride, sodium chloride flush   Assessment/Plan:    1. Acute hypoxic/hypercarbic respiratory failure due to COVID PNA: Remained markedly hypoxic despite full support. Intubated 12/26. S/p bilateral CTs 12/26 for pneumothoraces.  TEE on 12/26 LVEF 70% RV ok. VV ECMO begun 12/26. Tracheostomy 12/29.  COVID ARDS at this point. ECMO circuit changed 1/8 & 1/23. Crescent cannula placed 2/2 (femoral cannuals removed) - ECMO parameters as per ECMO note - Flows look good on Crescent - Developed PNA and sepsis on 2/15.  BAL with > 100K serratia. On mero and vanc and nebulized tobra. Can likely drop vanc - Off precedex. Continue to wean oral valium and dilaudid.  - Continue to wean sweep as tolerated - Will repeat  sweep trial today - D/w CCM would be ok with permissive hypercapnea if he can maintain pH. Have stopped diamox to faciltate metabolic compensation - Volume status slightly up. Will give one dose lasix to facilitate sweep trial  - LDH 390 - Continue heparin. Discussed dosing with PharmD personally. - Continue chest PT  2. Bilateral PTX: Due to barotrauma.  - Resolved. CTs out  3. COVID PNA: management of COVID infection per CCM. CXR with bilateral multifocal PNA progressing to likely ARDS.  - s/p 10 days of remdesivir - 12/16, 12/2 s/p tociluzimab - 12/17 s/p convalescent plasma - Decadron at 60m/daily completed 1/11 - Completed cefepime/vanc - Off Ancef  4. ID: Empiric coverage with vancomycin/cefepime initially for possible secondary bacterial PNA, course completed.   - treated with cefepime and vanc for + cx with staph epidimidis, MSSA and serratia (sensitivities ok). - developed recurrent PNA and sepsis 2/15 - BAL with > 100K serratia. On mero and vanc - Developed PNA  and sepsis on 2/15.  BAL with > 100K serratia. On mero and vanc and nebulized tobra. Can likely drop vanc  5. Anemia: - hgb 10.5 -> 8.2 -> 7.5 -. 8.3 -> 9.5  today. Transfuse as needed to keep Hgb> 8.0  7. FEN - now with G-tube.Continue TFs  8. Hypernatremia - NA 155 -> 143 -> 145 -> 142 -> 146 -> 150 -> 152 -> 148-> 147 -> 152 - Continue FW and continue IVF  9. AKI - Volume status looks good. AKI and azotemia improved with IVF and FW  Wil give one dose lasix today to facilitate sweep trial. Keep slightly positive today  10. DTIs - WOC following.   D/w with ECMO team (ECMO coordinator/specialists, CCM, TCTS and pharmD) at bedside on multidisciplinary rounds. Continue to await ARDS recovery. Details as above.   Length of Stay: 66  CRITICAL CARE Performed by: BGlori Bickers Total critical care time: 35 minutes  Critical care time was exclusive of separately billable procedures and treating other patients.  Critical care was necessary to treat or prevent imminent or life-threatening deterioration.  Critical care was time spent personally by me (independent of midlevel providers or residents) on the following activities: development of treatment plan with patient and/or surrogate as well as nursing, discussions with consultants, evaluation of patient's response to treatment, examination of patient, obtaining history from patient or surrogate, ordering and performing treatments and interventions, ordering and review of laboratory studies, ordering and review of radiographic studies, pulse oximetry and re-evaluation of patient's condition.    DGlori BickersMD 09/26/2019, 12:29 PM  Advanced Heart Failure Team Pager [phone](M-F; 7a - 4p)  Please contact [email or web address] for night-coverage after hours (4p -7a ) and weekends on [email or web address]

## 2019-09-26 NOTE — Progress Notes (Signed)
Pharmacy Antibiotic Note  [name] is a 44 y.o. male admitted on 07/22/2019 with pneumonia.  Pharmacy has been consulted for meropenem dosing.  Meropenem with more literature in ECMO than cefepime.  WBC today 13.3, afebrile. Bronch completed at bedside showing purulent fluid. Scr 0.78.    Plan: Meropenem 1 g IV every 8 hours F/u LOT.   Height: _0  (172.7 cm) Weight: 291 lb 3.6 oz (132.1 kg) IBW/kg (Calculated) : 68.4  Temp (24hrs), Avg:99.2 F (37.3 C), Min:98.5 F (36.9 C), Max:100.3 F (37.9 C)  Recent Labs  Lab 09/23/19 0425 09/23/19 1641 09/23/19 2006 09/24/19 0402 09/24/19 1555 09/25/19 0337 09/25/19 0400 09/25/19 1630 09/26/19 0433  WBC 12.8*   < >  --  21.4* 17.4* 14.4*  --  15.0* 13.3*  CREATININE 1.02   < >  --  0.97 0.90 0.92  --  0.82 0.78  LATICACIDVEN 1.1  --  1.6 1.8  --   --  1.4  --  1.3   < > = values in this interval not displayed.    Estimated Creatinine Clearance: 154.9 mL/min (by C-G formula based on SCr of 0.78 mg/dL).    No Known Allergies  Antimicrobials this admission: Remdesivir 12/14>>12/23 Actemra x1 12/16; 12/22 Plasma 12/17 Vanco 12/26 >>12/28, 1/4>>1/13, 2/15>> Cefepime 12/26 >>12/30, 1/4>>1/13 Meropenem 2/15>> Ctx 1/15>> (nose packing)1/28 restarted for 1 dose when changed foley 2/5 Ancef nasal packing 1/28>2/3  Dose adjustments this admission: N/A  Microbiology results: 12/14 BCx: ngF 12/14 sputum: Normal respiratory flora  12/25 MRSA PCR neg  12/28 trach: few MSSA 12/28 BCx: ngF 1/2 bronch: >100 k serratia marcescens (R cefazolin), >100 k staph epidermidis (S vanc, tetra) 1/4 BAL: MSSA, serratia marcescens (R cefazolin) 1/9 sputum: serratia/staph 1/9 bldx2 -ng 1/11 BAL: serratia 1/12 BAL: serratia 1/18 BAL: serratia - S cefepime 1/24 BCx x 2: ngF 1/30 Bcx: ngF 1/30 TA: mod serratia (R cefazolin and CTX) 2/1 BAL: 30k serratia (R- cefazolin), 10k enterococcus 2/2 Sputum: few GNR, rare GPRs 2/2 Bcx x2:  ngF 2/2 UCx: ngF 2/5 UCx: ngF  Thank you for allowing pharmacy to be a part of this patient's care. [name], [facility] Clinical Pharmacist Phone [phone]  09/26/2019 11:00 AM

## 2019-09-26 NOTE — Progress Notes (Signed)
ANTICOAGULATION CONSULT NOTE - Follow Up Consult  Pharmacy Consult for heparin Indication: ECMO  Assessment: 44yo male on VV- ECMO. Repeat heparin level low despite rate increase. No bleeding issues noted by nursing.  Goal of Therapy:  Heparin level 0.3-0.5 units/ml (aiming for lowest end of range)    Plan: -Increase heparin to 2000 units/h -Recheck heparin level at 1700 Thanks for allowing pharmacy to be a part of this patient's care.  Excell Seltzer, PharmD Clinical Pharmacist 09/26/2019

## 2019-09-26 NOTE — Progress Notes (Signed)
Inpatient Diabetes Program Recommendations  AACE/ADA: New Consensus Statement on Inpatient Glycemic Control (2015)  Target Ranges:  Prepandial:   less than 140 mg/dL      Peak postprandial:   less than 180 mg/dL (1-2 hours)      Critically ill patients:  140 - 180 mg/dL   Lab Results  Component Value Date   GLUCAP 213 (H) 09/26/2019   HGBA1C 5.5 09/23/2019    Review of Glycemic Control Results for AMOS, MICHEALS (MRN [id number]) as of 09/26/2019 11:16  Ref. Range 09/25/2019 19:55 09/25/2019 23:34 09/26/2019 04:38  Glucose-Capillary Latest Ref Range: 70 - 99 mg/dL 248 (H) 253 (H) 213 (H)   Outpatient Diabetes medications:none Current orders for Inpatient glycemic control:Novolog 5 units Q4H, Novolog 0-20 units Q4H, Levemir 15 units BID Osmolite 1.5 cal Q4H  Inpatient Diabetes Program Recommendations:  Consider increasing Levemir to 18 units BID.  Also, want to consider adjusting times to match tube feeding bolus times. Thus, Novolog tube feed bolus coverage: Novolog 5 units Q4 hours for: 12am, 4am, 8am, 12pm, 4pm, 8pm HOLD if tube feed boluses HELD for any reason  Thanks, Bronson Curb, MSN, RNC-OB Diabetes Coordinator [phone] (8a-5p)

## 2019-09-27 ENCOUNTER — Inpatient Hospital Stay (HOSPITAL_COMMUNITY): Payer: Managed Care, Other (non HMO)

## 2019-09-27 LAB — POCT I-STAT 7, (LYTES, BLD GAS, ICA,H+H)
Acid-Base Excess: 8 mmol/L — ABNORMAL HIGH (ref 0.0–2.0)
Acid-Base Excess: 8 mmol/L — ABNORMAL HIGH (ref 0.0–2.0)
Acid-Base Excess: 7 mmol/L — ABNORMAL HIGH (ref 0.0–2.0)
Acid-Base Excess: 7 mmol/L — ABNORMAL HIGH (ref 0.0–2.0)
Acid-Base Excess: 8 mmol/L — ABNORMAL HIGH (ref 0.0–2.0)
Acid-Base Excess: 6 mmol/L — ABNORMAL HIGH (ref 0.0–2.0)
Acid-Base Excess: 8 mmol/L — ABNORMAL HIGH (ref 0.0–2.0)
Bicarbonate: 32.8 mmol/L — ABNORMAL HIGH (ref 20.0–28.0)
Bicarbonate: 33.1 mmol/L — ABNORMAL HIGH (ref 20.0–28.0)
Bicarbonate: 33.8 mmol/L — ABNORMAL HIGH (ref 20.0–28.0)
Bicarbonate: 34.2 mmol/L — ABNORMAL HIGH (ref 20.0–28.0)
Bicarbonate: 33.5 mmol/L — ABNORMAL HIGH (ref 20.0–28.0)
Bicarbonate: 31.5 mmol/L — ABNORMAL HIGH (ref 20.0–28.0)
Bicarbonate: 32.8 mmol/L — ABNORMAL HIGH (ref 20.0–28.0)
Calcium, Ion: 1.31 mmol/L (ref 1.15–1.40)
Calcium, Ion: 1.29 mmol/L (ref 1.15–1.40)
Calcium, Ion: 1.33 mmol/L (ref 1.15–1.40)
Calcium, Ion: 1.34 mmol/L (ref 1.15–1.40)
Calcium, Ion: 1.31 mmol/L (ref 1.15–1.40)
Calcium, Ion: 1.36 mmol/L (ref 1.15–1.40)
Calcium, Ion: 1.31 mmol/L (ref 1.15–1.40)
HCT: 27 % — ABNORMAL LOW (ref 39.0–52.0)
HCT: 28 % — ABNORMAL LOW (ref 39.0–52.0)
HCT: 28 % — ABNORMAL LOW (ref 39.0–52.0)
HCT: 30 % — ABNORMAL LOW (ref 39.0–52.0)
HCT: 28 % — ABNORMAL LOW (ref 39.0–52.0)
HCT: 28 % — ABNORMAL LOW (ref 39.0–52.0)
HCT: 27 % — ABNORMAL LOW (ref 39.0–52.0)
Hemoglobin: 9.2 g/dL — ABNORMAL LOW (ref 13.0–17.0)
Hemoglobin: 9.5 g/dL — ABNORMAL LOW (ref 13.0–17.0)
Hemoglobin: 10.2 g/dL — ABNORMAL LOW (ref 13.0–17.0)
Hemoglobin: 9.5 g/dL — ABNORMAL LOW (ref 13.0–17.0)
Hemoglobin: 9.5 g/dL — ABNORMAL LOW (ref 13.0–17.0)
Hemoglobin: 9.5 g/dL — ABNORMAL LOW (ref 13.0–17.0)
Hemoglobin: 9.2 g/dL — ABNORMAL LOW (ref 13.0–17.0)
O2 Saturation: 98 %
O2 Saturation: 99 %
O2 Saturation: 90 %
O2 Saturation: 96 %
O2 Saturation: 99 %
O2 Saturation: 96 %
O2 Saturation: 93 %
Patient temperature: 37
Patient temperature: 37
Patient temperature: 37.1
Patient temperature: 37
Patient temperature: 36.9
Patient temperature: 98.3
Potassium: 4 mmol/L (ref 3.5–5.1)
Potassium: 4.4 mmol/L (ref 3.5–5.1)
Potassium: 4.3 mmol/L (ref 3.5–5.1)
Potassium: 4.5 mmol/L (ref 3.5–5.1)
Potassium: 4.4 mmol/L (ref 3.5–5.1)
Potassium: 4.2 mmol/L (ref 3.5–5.1)
Potassium: 4.3 mmol/L (ref 3.5–5.1)
Sodium: 155 mmol/L — ABNORMAL HIGH (ref 135–145)
Sodium: 154 mmol/L — ABNORMAL HIGH (ref 135–145)
Sodium: 154 mmol/L — ABNORMAL HIGH (ref 135–145)
Sodium: 155 mmol/L — ABNORMAL HIGH (ref 135–145)
Sodium: 157 mmol/L — ABNORMAL HIGH (ref 135–145)
Sodium: 154 mmol/L — ABNORMAL HIGH (ref 135–145)
Sodium: 155 mmol/L — ABNORMAL HIGH (ref 135–145)
TCO2: 34 mmol/L — ABNORMAL HIGH (ref 22–32)
TCO2: 35 mmol/L — ABNORMAL HIGH (ref 22–32)
TCO2: 36 mmol/L — ABNORMAL HIGH (ref 22–32)
TCO2: 36 mmol/L — ABNORMAL HIGH (ref 22–32)
TCO2: 35 mmol/L — ABNORMAL HIGH (ref 22–32)
TCO2: 33 mmol/L — ABNORMAL HIGH (ref 22–32)
TCO2: 34 mmol/L — ABNORMAL HIGH (ref 22–32)
pCO2 arterial: 47.2 mmHg (ref 32.0–48.0)
pCO2 arterial: 48.7 mmHg — ABNORMAL HIGH (ref 32.0–48.0)
pCO2 arterial: 63.5 mmHg — ABNORMAL HIGH (ref 32.0–48.0)
pCO2 arterial: 66.9 mmHg (ref 32.0–48.0)
pCO2 arterial: 52.3 mmHg — ABNORMAL HIGH (ref 32.0–48.0)
pCO2 arterial: 49 mmHg — ABNORMAL HIGH (ref 32.0–48.0)
pCO2 arterial: 44.3 mmHg (ref 32.0–48.0)
pH, Arterial: 7.45 (ref 7.350–7.450)
pH, Arterial: 7.441 (ref 7.350–7.450)
pH, Arterial: 7.317 — ABNORMAL LOW (ref 7.350–7.450)
pH, Arterial: 7.334 — ABNORMAL LOW (ref 7.350–7.450)
pH, Arterial: 7.414 (ref 7.350–7.450)
pH, Arterial: 7.415 (ref 7.350–7.450)
pH, Arterial: 7.477 — ABNORMAL HIGH (ref 7.350–7.450)
pO2, Arterial: 96 mmHg (ref 83.0–108.0)
pO2, Arterial: 155 mmHg — ABNORMAL HIGH (ref 83.0–108.0)
pO2, Arterial: 67 mmHg — ABNORMAL LOW (ref 83.0–108.0)
pO2, Arterial: 92 mmHg (ref 83.0–108.0)
pO2, Arterial: 126 mmHg — ABNORMAL HIGH (ref 83.0–108.0)
pO2, Arterial: 83 mmHg (ref 83.0–108.0)
pO2, Arterial: 64 mmHg — ABNORMAL LOW (ref 83.0–108.0)

## 2019-09-27 LAB — HEPARIN LEVEL (UNFRACTIONATED)
Heparin Unfractionated: 0.54 IU/mL (ref 0.30–0.70)
Heparin Unfractionated: 0.59 IU/mL (ref 0.30–0.70)

## 2019-09-27 LAB — MAGNESIUM: Magnesium: 2.5 mg/dL — ABNORMAL HIGH (ref 1.7–2.4)

## 2019-09-27 LAB — BASIC METABOLIC PANEL
Anion gap: 9 (ref 5–15)
Anion gap: 8 (ref 5–15)
BUN: 89 mg/dL — ABNORMAL HIGH (ref 6–20)
BUN: 101 mg/dL — ABNORMAL HIGH (ref 6–20)
CO2: 32 mmol/L (ref 22–32)
CO2: 29 mmol/L (ref 22–32)
Calcium: 9.2 mg/dL (ref 8.9–10.3)
Calcium: 9 mg/dL (ref 8.9–10.3)
Chloride: 112 mmol/L — ABNORMAL HIGH (ref 98–111)
Chloride: 112 mmol/L — ABNORMAL HIGH (ref 98–111)
Creatinine, Ser: 0.67 mg/dL (ref 0.61–1.24)
Creatinine, Ser: 0.85 mg/dL (ref 0.61–1.24)
GFR calc Af Amer: >60 mL/min (ref >60)
GFR calc Af Amer: >60 mL/min (ref >60)
GFR calc non Af Amer: >60 mL/min (ref >60)
GFR calc non Af Amer: >60 mL/min (ref >60)
Glucose, Bld: 147 mg/dL — ABNORMAL HIGH (ref 70–99)
Glucose, Bld: 162 mg/dL — ABNORMAL HIGH (ref 70–99)
Potassium: 4.1 mmol/L (ref 3.5–5.1)
Potassium: 4.2 mmol/L (ref 3.5–5.1)
Sodium: 153 mmol/L — ABNORMAL HIGH (ref 135–145)
Sodium: 149 mmol/L — ABNORMAL HIGH (ref 135–145)

## 2019-09-27 LAB — COOXEMETRY PANEL
Carboxyhemoglobin: 2 % — ABNORMAL HIGH (ref 0.5–1.5)
Carboxyhemoglobin: 2.2 % — ABNORMAL HIGH (ref 0.5–1.5)
Carboxyhemoglobin: 2.2 % — ABNORMAL HIGH (ref 0.5–1.5)
Methemoglobin: 0.5 % (ref 0.0–1.5)
Methemoglobin: 0.7 % (ref 0.0–1.5)
Methemoglobin: 0.6 % (ref 0.0–1.5)
O2 Saturation: 98.5 %
O2 Saturation: 98.8 %
O2 Saturation: 78.4 %
Total hemoglobin: 16.8 g/dL — ABNORMAL HIGH (ref 12.0–16.0)
Total hemoglobin: 10.7 g/dL — ABNORMAL LOW (ref 12.0–16.0)
Total hemoglobin: 8.9 g/dL — ABNORMAL LOW (ref 12.0–16.0)

## 2019-09-27 LAB — GLUCOSE, CAPILLARY
Glucose-Capillary: 142 mg/dL — ABNORMAL HIGH (ref 70–99)
Glucose-Capillary: 142 mg/dL — ABNORMAL HIGH (ref 70–99)
Glucose-Capillary: 131 mg/dL — ABNORMAL HIGH (ref 70–99)
Glucose-Capillary: 176 mg/dL — ABNORMAL HIGH (ref 70–99)
Glucose-Capillary: 164 mg/dL — ABNORMAL HIGH (ref 70–99)
Glucose-Capillary: 156 mg/dL — ABNORMAL HIGH (ref 70–99)

## 2019-09-27 LAB — CBC
HCT: 30 % — ABNORMAL LOW (ref 39.0–52.0)
HCT: 31.9 % — ABNORMAL LOW (ref 39.0–52.0)
Hemoglobin: 8.6 g/dL — ABNORMAL LOW (ref 13.0–17.0)
Hemoglobin: 9.1 g/dL — ABNORMAL LOW (ref 13.0–17.0)
MCH: 27.4 pg (ref 26.0–34.0)
MCH: 27.5 pg (ref 26.0–34.0)
MCHC: 28.7 g/dL — ABNORMAL LOW (ref 30.0–36.0)
MCHC: 28.5 g/dL — ABNORMAL LOW (ref 30.0–36.0)
MCV: 95.5 fL (ref 80.0–100.0)
MCV: 96.4 fL (ref 80.0–100.0)
Platelets: 122 10*3/uL — ABNORMAL LOW (ref 150–400)
Platelets: 145 10*3/uL — ABNORMAL LOW (ref 150–400)
RBC: 3.14 MIL/uL — ABNORMAL LOW (ref 4.22–5.81)
RBC: 3.31 MIL/uL — ABNORMAL LOW (ref 4.22–5.81)
RDW: 17.1 % — ABNORMAL HIGH (ref 11.5–15.5)
RDW: 17.2 % — ABNORMAL HIGH (ref 11.5–15.5)
WBC: 11 10*3/uL — ABNORMAL HIGH (ref 4.0–10.5)
WBC: 13.6 10*3/uL — ABNORMAL HIGH (ref 4.0–10.5)
nRBC: 0.2 % (ref 0.0–0.2)
nRBC: 0.2 % (ref 0.0–0.2)

## 2019-09-27 LAB — TYPE AND SCREEN
ABO/RH(D): O POS
Antibody Screen: NEGATIVE

## 2019-09-27 LAB — APTT
aPTT: 100 seconds — ABNORMAL HIGH (ref 24–36)
aPTT: 86 seconds — ABNORMAL HIGH (ref 24–36)

## 2019-09-27 LAB — PHOSPHORUS: Phosphorus: 3.4 mg/dL (ref 2.5–4.6)

## 2019-09-27 LAB — LACTIC ACID, PLASMA: Lactic Acid, Venous: 1.1 mmol/L (ref 0.5–1.9)

## 2019-09-27 LAB — FIBRINOGEN: Fibrinogen: 522 mg/dL — ABNORMAL HIGH (ref 210–475)

## 2019-09-27 LAB — LACTATE DEHYDROGENASE: LDH: 355 U/L — ABNORMAL HIGH (ref 98–192)

## 2019-09-27 NOTE — Progress Notes (Signed)
Patient ID: [name], male   DOB: 1975/12/22, 44 y.o.   MRN: [id number]    Advanced Heart Failure Rounding Note   Subjective:    Remains on VV ecmo.  BAL growing > 100K serratia. On meropenum and vanc  CVP 6-7  Sweep 8  On vent at 40% TVs > 300-400cc  CXR with diffuse infiltrates. Unchanged today. Crescent catheter well positioned Personally reviewed  Will arouse on vent but not reliably follow commands for me.  ABG 7.45/47/96/98%    Co-ox 98%  LDH 355  hgb 8.6  Sodium 152 -> 153  BUN 138 -> 127 -> 122-> 108 -> 97 -> 89 Cr 0.67   ECMO parameters - see separate ecmo note   Objective:   Weight Range:  Vital Signs:   Temp:  [98.2 F (36.8 C)-99 F (37.2 C)] 98.4 F (36.9 C) (02/19 1129) Pulse Rate:  [75-95] 87 (02/19 1200) Resp:  [18-30] 26 (02/19 1200) BP: (132-177)/(56-66) 158/65 (02/19 0338) SpO2:  [97 %-100 %] 100 % (02/19 1200) Arterial Line BP: (138-180)/(54-73) 171/66 (02/19 1200) FiO2 (%):  [40 %-50 %] 40 % (02/19 1135) Weight:  [129.4 kg] 129.4 kg (02/19 0500) Last BM Date: 09/26/19  Weight change: Filed Weights   09/25/19 0500 09/26/19 0600 09/27/19 0500  Weight: 134.3 kg 132.1 kg 129.4 kg    Intake/Output:   Intake/Output Summary (Last 24 hours) at 09/27/2019 1225 Last data filed at 09/27/2019 1210 Gross per 24 hour  Intake 2461.89 ml  Output 3575 ml  Net -1113.11 ml     Physical Exam: General:  Sedated will arouse to voice HEENT: normal Neck: supple. RIJ cannula Carotids 2+ bilat; no bruits. No lymphadenopathy or thryomegaly appreciated. Cor: PMI nondisplaced. Regular rate & rhythm. No rubs, gallops or murmurs. Lungs: clear Abdomen: obese soft, nontender, nondistended. No hepatosplenomegaly. No bruits or masses. Good bowel sounds. Extremities: no cyanosis, clubbing, rash, 1+ edema Neuro: Sedated will arouse to voice   Telemetry: Sinus 80-90s Personally reviewed   Labs: Basic Metabolic Panel: Recent Labs  Lab  09/23/19 0425 09/23/19 0452 09/24/19 0402 09/24/19 0410 09/25/19 7322 09/25/19 0739 09/25/19 1630 09/25/19 1633 09/26/19 0433 09/26/19 0547 09/26/19 1628 09/26/19 1645 09/26/19 2120 09/27/19 0449 09/27/19 0458  NA 152*   < > 146*   < > 147*   < > 147*   < > 151*   < > 152* 149* 153* 155* 153*  K 4.7   < > 5.0   < > 4.1   < > 4.4   < > 4.2   < > 3.9 3.9 3.8 4.0 4.1  CL 112*   < > 108   < > 110  --  111  --  114*  --   --  111  --   --  112*  CO2 29   < > 27   < > 28  --  27  --  28  --   --  29  --   --  32  GLUCOSE 235*   < > 317*   < > 301*  --  288*  --  231*  --   --  228*  --   --  147*  BUN 127*   < > 122*   < > 108*  --  111*  --  97*  --   --  95*  --   --  89*  CREATININE 1.02   < > 0.97   < >  0.92  --  0.82  --  0.78  --   --  0.83  --   --  0.67  CALCIUM 9.2   < > 9.1   < > 9.2   < > 9.3   < > 9.2  --   --  9.1  --   --  9.2  MG 2.5*  --  2.4  --  2.3  --   --   --  2.2  --   --   --   --   --  2.5*  PHOS 3.0  --  3.4  --  3.6  --   --   --  3.3  --   --   --   --   --  3.4   < > = values in this interval not displayed.    Liver Function Tests: No results for input(s): AST, ALT, ALKPHOS, BILITOT, PROT, ALBUMIN in the last 168 hours. No results for input(s): LIPASE, AMYLASE in the last 168 hours. No results for input(s): AMMONIA in the last 168 hours.  CBC: Recent Labs  Lab 09/25/19 0337 09/25/19 0739 09/25/19 1630 09/25/19 1633 09/26/19 0433 09/26/19 0547 09/26/19 1628 09/26/19 1645 09/26/19 2120 09/27/19 0449 09/27/19 0458  WBC 14.4*  --  15.0*  --  13.3*  --   --  15.0*  --   --  11.0*  HGB 8.3*   < > 8.4*   < > 8.6*   < > 9.9* 8.9* 9.5* 9.2* 8.6*  HCT 28.4*   < > 29.0*   < > 29.7*   < > 29.0* 30.8* 28.0* 27.0* 30.0*  MCV 96.3  --  95.1  --  94.6  --   --  96.0  --   --  95.5  PLT 113*  --  114*  --  125*  --   --  138*  --   --  122*   < > = values in this interval not displayed.    Cardiac Enzymes: No results for input(s): CKTOTAL, CKMB,  CKMBINDEX, TROPONINI in the last 168 hours.  BNP: BNP (last 3 results) Recent Labs    07/22/19 1039  BNP 15.3    ProBNP (last 3 results) No results for input(s): PROBNP in the last 8760 hours.    Other results:  Imaging: DG CHEST PORT 1 VIEW  Result Date: 09/27/2019 CLINICAL DATA:  Tracheostomy.  ECMO. EXAM: PORTABLE CHEST 1 VIEW COMPARISON:  09/26/2019.  09/25/2019. FINDINGS: Tracheostomy tube and ECMO cannula in stable position. Right PICC line in stable position. Stable cardiomegaly. Diffuse unchanged bilateral pulmonary infiltrates/edema. No prominent pleural effusion noted. No pneumothorax. IMPRESSION: 1. Tracheostomy tube, ECMO cannula, right PICC line stable position. 2.  Stable cardiomegaly. 3.  Unchanged diffuse bilateral pulmonary infiltrates/edema. Electronically Signed   By: Marcello Moores  Register   On: 09/27/2019 07:22   DG CHEST PORT 1 VIEW  Result Date: 09/26/2019 CLINICAL DATA:  ECMO. EXAM: PORTABLE CHEST 1 VIEW COMPARISON:  Radiograph yesterday. FINDINGS: Tracheostomy tube tip at the thoracic inlet. Right internal jugular ECMO catheter tip below the diaphragm not included in the field of view. Right upper extremity PICC tip in the SVC, partially obscured by ECMO catheter. Slight improvement in lung volumes from prior. Heterogeneous bilateral lung opacities, not significantly changed from prior. Stable heart size and mediastinal contours. No pneumothorax. IMPRESSION: 1. Slight improvement in lung volumes from prior. Heterogeneous bilateral lung opacities are not significantly changed. 2. Support apparatus unchanged. Electronically  Signed   By: Keith Rake M.D.   On: 09/26/2019 05:26     Medications:     Scheduled Medications: . amiodarone  200 mg Oral Daily  . vitamin C  500 mg Per Tube Daily  . chlorhexidine gluconate (MEDLINE KIT)  15 mL Mouth Rinse BID  . Chlorhexidine Gluconate Cloth  6 each Topical Daily  . cloNIDine  0.1 mg Oral Q6H  . diazepam  2.5 mg Per  Tube Q12H  . feeding supplement (OSMOLITE 1.5 CAL)  237 mL Per Tube 6 X Daily  . feeding supplement (PRO-STAT SUGAR FREE 64)  60 mL Per Tube QID  . free water  400 mL Per Tube Q6H  . gabapentin  100 mg Per Tube Q12H  . HYDROmorphone  4 mg Oral Q6H   Followed by  . [START ON 09/28/2019] HYDROmorphone  2 mg Oral Q6H  . insulin aspart  0-20 Units Subcutaneous Q4H  . insulin aspart  5 Units Subcutaneous Q4H  . insulin detemir  18 Units Subcutaneous BID  . ipratropium-albuterol  3 mL Nebulization Q4H  . mouth rinse  15 mL Mouth Rinse 10 times per day  . montelukast  10 mg Per Tube QHS  . nutrition supplement (JUVEN)  1 packet Per Tube BID BM  . pantoprazole sodium  40 mg Per Tube BID  . propranolol  40 mg Per Tube TID  . psyllium  1 packet Oral BID  . QUEtiapine  50 mg Per Tube QHS  . sodium chloride flush  10-40 mL Intracatheter Q12H  . sodium chloride flush  10-40 mL Intracatheter Q12H  . tobramycin (PF)  300 mg Nebulization BID  . white petrolatum   Topical BID    Infusions: . sodium chloride    . sodium chloride Stopped (09/27/19 0753)  . sodium chloride 10 mL/hr at 09/27/19 1200  . dexmedetomidine (PRECEDEX) IV infusion Stopped (09/23/19 1336)  . heparin 1,950 Units/hr (09/27/19 1200)  . meropenem (MERREM) IV 2 g (09/27/19 0510)  . vasopressin (PITRESSIN) infusion - *FOR SHOCK* Stopped (09/24/19 0917)    PRN Medications: Place/Maintain arterial line **AND** sodium chloride, sodium chloride, sodium chloride, acetaminophen (TYLENOL) oral liquid 160 mg/5 mL, acetaminophen, hydrALAZINE, [COMPLETED] HYDROmorphone **FOLLOWED BY** [COMPLETED] HYDROmorphone **FOLLOWED BY** HYDROmorphone **FOLLOWED BY** [START ON 09/28/2019] HYDROmorphone **FOLLOWED BY** [START ON 10/01/2019] HYDROmorphone, magic mouthwash, magnesium hydroxide, midazolam, ondansetron (ZOFRAN) IV, senna-docusate, sodium chloride, sodium chloride flush   Assessment/Plan:    1. Acute hypoxic/hypercarbic respiratory  failure due to COVID PNA: Remained markedly hypoxic despite full support. Intubated 12/26. S/p bilateral CTs 12/26 for pneumothoraces.  TEE on 12/26 LVEF 70% RV ok. VV ECMO begun 12/26. Tracheostomy 12/29.  COVID ARDS at this point. ECMO circuit changed 1/8 & 1/23. Crescent cannula placed 2/2 (femoral cannuals removed) - ECMO parameters as per ECMO note - Flows look good on Crescent - Developed PNA and sepsis on 2/15.  BAL with > 100K serratia. On mero and nebulized tobra. - Off precedex. Continue to wean oral valium and dilaudid.  - Continue to daily sweep trials - D/w CCM would be ok with permissive hypercapnea if he can maintain pH. Have stopped diamox to faciltate metabolic compensation - Volume status ok. Can give lasix as needed - LDH 355 - Continue heparin. Discussed dosing with PharmD personally. - Continue chest PT  2. Bilateral PTX: Due to barotrauma.  - Resolved. CTs out  3. COVID PNA: management of COVID infection per CCM. CXR with bilateral multifocal PNA progressing to likely  ARDS.  - s/p 10 days of remdesivir - 12/16, 12/2 s/p tociluzimab - 12/17 s/p convalescent plasma - Decadron at 40m/daily completed 1/11 - Completed cefepime/vanc - Off Ancef  4. ID: Empiric coverage with vancomycin/cefepime initially for possible secondary bacterial PNA, course completed.   - treated with cefepime and vanc for + cx with staph epidimidis, MSSA and serratia (sensitivities ok). - developed recurrent PNA and sepsis 2/15 - BAL with > 100K serratia. On mero and vanc - Developed PNA and sepsis on 2/15.  BAL with > 100K serratia. On mero and vanc and nebulized tobra. Can likely drop vanc  5. Anemia: - hgb 10.5 -> 8.2 -> 7.5 -. 8.3 -> 9.5 -> 8.6  today. Transfuse as needed to keep Hgb> 8.0  7. FEN - now with G-tube.Continue TFs  8. Hypernatremia - NA 155 -> 143 -> 145 -> 142 -> 146 -> 150 -> 152 -> 148-> 147 -> 152 -> 153 - Continue FW. Can add IVF as needed  9. AKI - Volume status  looks good. AKI and azotemia improved with IVF and FW  10. DTIs - WOC following.   D/w with ECMO team (ECMO coordinator/specialists, CCM, TCTS and pharmD) at bedside on multidisciplinary rounds. Continue to await ARDS recovery. Details as above.   Length of Stay: 636 CRITICAL CARE Performed by: BGlori Bickers Total critical care time: 35 minutes  Critical care time was exclusive of separately billable procedures and treating other patients.  Critical care was necessary to treat or prevent imminent or life-threatening deterioration.  Critical care was time spent personally by me (independent of midlevel providers or residents) on the following activities: development of treatment plan with patient and/or surrogate as well as nursing, discussions with consultants, evaluation of patient's response to treatment, examination of patient, obtaining history from patient or surrogate, ordering and performing treatments and interventions, ordering and review of laboratory studies, ordering and review of radiographic studies, pulse oximetry and re-evaluation of patient's condition.    DGlori BickersMD 09/27/2019, 12:25 PM  Advanced Heart Failure Team Pager [phone](M-F; 7a - 4p)  Please contact [facility] for night-coverage after hours (4p -7a ) and weekends on [email or web address]

## 2019-09-27 NOTE — Progress Notes (Signed)
ANTICOAGULATION CONSULT NOTE - Follow Up Consult  Pharmacy Consult for heparin Indication: ECMO  Assessment: 44yo male on VV- ECMO.  Heparin level 0.59 - slightly above goal  No bleeding per RN  Goal of Therapy:  Heparin level 0.3-0.5 units/ml (aiming for lowest end of range)  Plan: -Decrease heparin to 1850 units/hr -Recheck heparin level in am  Barth Kirks, PharmD, BCPS, BCCCP Clinical Pharmacist [phone]  Please check AMION for all [facility] numbers  09/27/2019 6:52 PM

## 2019-09-27 NOTE — Progress Notes (Signed)
NAME:  [name], MRN:  [id number], DOB:  1976-03-11, LOS: 75 ADMISSION DATE:  07/22/2019, CONSULTATION DATE:  12/24 REFERRING MD:  Sloan Leiter, CHIEF COMPLAINT:  Dyspnea   Brief History   44 y/o male admitted 12/14 with COVID pneumonia leading to ARDS, pneumomediastinum.  Intubated 12/26, bilateral chest tubes placed.  Past Medical History  GERD HTN Asthma  Significant Hospital Events   12/14 admit with hypoxemic respiratory failure in setting of COVID-19 pneumonia 12/23 noted to have pneumomediastinum on chest x-ray 12/24PCCM consulted for evaluation 12/26 worsening hypoxemia, intubated 12/27 VV fem/fem ECMO cannulation 12/29 tracheostomy >> See bedside nursing event notes for full rundown of procedures after 12/27 1/5 epistaxis, nasal packing 1/30 improved cxr, improved lung compliance, TV 350s on 15PC  1/31 CXR increased infiltrates, +CFB     2/2 Converted to right IJ Crescent cannula. 2/3 PEG tube inserted at bedside 2/14 30 min sweep trial> paO2 105 (18 PEEP, 60% FiO2) pH 7.22, pCO2 87  Consults:  PCCM, CTS, CHF, Palliative  Procedures:  PICC 1/3 >> R radial a-line 1/20 >> R IJ ECMO cath 2/3 >> Tracheostomy 12/29 >>  Significant Diagnostic Tests:  CT chest 12/22>>extensive pneumomediastinum, multifocal patchy bilateral groundglass opacities CXR 1/25 >> improving bilateral infiltrates. ECMO cannulae in place. CXR 2/5 >> clearing bilateral infiltrates  Micro Data:  BCx2 12/14 >>negative  Sputum 12/15 >> OPF 12/28 blood > negative 12/29 > staph aureus 1/4 bronch:  1/2 acid fast smear bronch: negative 1/2 fungal cx bronch: negative1/12 BAL: serratia, staph epidermidisu 1/4 BAL > MSSA, Serratia 1/9 blood > negative 1/9 Resp culture > MSSA, serratia 1/11 serratia > negative 1/12 fungus culture > negative 1/12 BAL > serratia 1/18 BAL >> rare Serratia. 1/24 blood > negative 1/30 blood > negative 1/30 resp culture > serratia 2/1 BAL >> rare Serratia -  enterococcus 2/1 aspergillus Ag > negative 2/2 resp culture > serratia  2/2 blood > negative 2/5 urine > negative 2/9 resp: serratia  Antimicrobials:  Received remdesivir, steroids, actemra and convalescent plasma  5 days cefepime 12/24-12/29 Cefepime 1/4->1/13 vanc 1/4> 1/11 Ceftriaxone 1/14 > 1/27 vanc x1 1/20 Cefazolin 1/29 > 2/2 cefuroxim 2/2 vanc 2/2 Ceftriaxone x1 2/4  Interim history/subjective:   Patient remains critically ill in intensive care on unit on VV ECMO.  Will open eyes to voice.   Objective   Blood pressure (!) 158/65, pulse 86, temperature 98.4 F (36.9 C), resp. rate (!) 21, height _0  (1.727 m), weight 129.4 kg, SpO2 100 %. CVP:  [6 mmHg-7 mmHg] 6 mmHg  Vent Mode: PCV FiO2 (%):  [40 %-60 %] 40 % Set Rate:  [15 bmp] 15 bmp PEEP:  [15 cmH20] 15 cmH20 Plateau Pressure:  [31 cmH20-38 cmH20] 35 cmH20   Intake/Output Summary (Last 24 hours) at 09/27/2019 0902 Last data filed at 09/27/2019 0600 Gross per 24 hour  Intake 1867.74 ml  Output 3325 ml  Net -1457.26 ml   Filed Weights   09/25/19 0500 09/26/19 0600 09/27/19 0500  Weight: 134.3 kg 132.1 kg 129.4 kg    Examination:  General: In the bed, tracheostomy tube in place on mechanical support, VV ECMO right IJ cannulation HENT: NCAT, tracheostomy tube in place, right internal jugular VV ECMO cannulation PULM: Bilateral ventilated breath sounds CV: Regular rate and rhythm S1-S2 GI: Bowel sounds present nontender PEG tube in place MSK: Some bilateral dependent edema Neuro: Sedated on mechanical support, will open eyes to voice  Flow (LPM): 5.01   Resolved Hospital Problem  list     Assessment & Plan:   ARDS due to COVID 19, requiring mechanical ventilation and VV ECMO  -Patient did well yesterday with a 25-minute sweep trial. -Plan again today for an additional sleep trial -Attempt to maintain driving pressures at 15 or less, low tidal volume ventilation strategy on mechanical  support -FiO2 still at 40% -We will continue to observe for slow recovery of spontaneous tidal volume with support. -Ventilator associated pneumonia prevention protocol -Routine trach care -Continue tapering of sedatives -Would consider stopping oral Valium as early as tomorrow, currently on 2.5 BID   Pneumothoraces-resolved. -Chest tubes removed  Hypernatremia -FW replacement   AKI with azotemia Positive cumulative fluid balance BUN is slowly improving - attempting to maintain neg fluid balance   Blood tinged GI secretions > none on 2/15 -no evidence of active bleeding   Sedation needs - weaning as tolerated   Hypertension - clonidine and propanolol  - may need to consider decreasing clonidine dosing if he is still having sedative effect   Serratia pneumonia> treated 70 stop date for antibiotics meropenem and twice daily TOBI nebs  Neuro muscular weakness Appreciate PT input.  For range of motion  Bilateral lower feet deep tissue injury Wound care following   Best practice:  Pain/Anxiety/Delirium protocol (if indicated): Continue to keep off continuous infusions may need to restart Precedex, tapering oral and long-acting sedation. VAP protocol (if indicated): Bundle in place. Respiratory support goals: Lung protective ventilation strategy Blood pressure target: Vasopressors at this time wean arterial pressure greater than 65 DVT prophylaxis: Heparin drip Nutrition Status: Bolus feeding Nutrition Problem: Increased nutrient needs Etiology: acute illness(COVID PNA) Signs/Symptoms: estimated needs Interventions: Tube feeding, Prostat   bolus feeds continued GI prophylaxis: Protonix per tube Fluid status goals:  Attempting to maintain negative fluid balance Urinary catheter: Guide hemodynamic management Glucose control: Improving hyperglycemia with increased mealtime coverage. Mobility/therapy needs: Bedrest.  Daily labs: as per ECMO protocol. Code Status: Full  code  Family Communication: Wife updated 2/13 Disposition: ICU  Labs   CBC: CBC Latest Ref Rng & Units 09/27/2019 09/27/2019 09/26/2019  WBC 4.0 - 10.5 K/uL 11.0(H) - -  Hemoglobin 13.0 - 17.0 g/dL 8.6(L) 9.2(L) 9.5(L)  Hematocrit 39.0 - 52.0 % 30.0(L) 27.0(L) 28.0(L)  Platelets 150 - 400 K/uL 122(L) - -     Basic Metabolic Panel: BMP Latest Ref Rng & Units 09/27/2019 09/27/2019 09/26/2019  Glucose 70 - 99 mg/dL 147(H) - -  BUN 6 - 20 mg/dL 89(H) - -  Creatinine 0.61 - 1.24 mg/dL 0.67 - -  Sodium 135 - 145 mmol/L 153(H) 155(H) 153(H)  Potassium 3.5 - 5.1 mmol/L 4.1 4.0 3.8  Chloride 98 - 111 mmol/L 112(H) - -  CO2 22 - 32 mmol/L 32 - -  Calcium 8.9 - 10.3 mg/dL 9.2 - -     GFR: Estimated Creatinine Clearance: 153.1 mL/min (by C-G formula based on SCr of 0.67 mg/dL). Recent Labs  Lab 09/24/19 0402 09/24/19 1555 09/25/19 0337 09/25/19 0400 09/25/19 1630 09/26/19 0433 09/26/19 1645 09/27/19 0458 09/27/19 0459  WBC 21.4*   < >   < >  --  15.0* 13.3* 15.0* 11.0*  --   LATICACIDVEN 1.8  --   --  1.4  --  1.3  --   --  1.1   < > = values in this interval not displayed.    Liver Function Tests: No results for input(s): AST, ALT, ALKPHOS, BILITOT, PROT, ALBUMIN in the last 168 hours. No  results for input(s): LIPASE, AMYLASE in the last 168 hours. No results for input(s): AMMONIA in the last 168 hours.  ABG    Component Value Date/Time   PHART 7.450 09/27/2019 0449   PCO2ART 47.2 09/27/2019 0449   PO2ART 96.0 09/27/2019 0449   HCO3 32.8 (H) 09/27/2019 0449   TCO2 34 (H) 09/27/2019 0449   ACIDBASEDEF 4.0 (H) 09/16/2019 2334   O2SAT 78.4 09/27/2019 0833     Coagulation Profile: No results for input(s): INR, PROTIME in the last 168 hours.  Cardiac Enzymes: No results for input(s): CKTOTAL, CKMB, CKMBINDEX, TROPONINI in the last 168 hours.  HbA1C: Hgb A1c MFr Bld  Date/Time Value Ref Range Status  09/23/2019 08:58 AM 5.5 4.8 - 5.6 % Final    Comment:     (NOTE) Pre diabetes:          5.7%-6.4% Diabetes:              >6.4% Glycemic control for   <7.0% adults with diabetes   07/24/2019 04:50 AM 6.7 (H) 4.8 - 5.6 % Final    Comment:    (NOTE) Pre diabetes:          5.7%-6.4% Diabetes:              >6.4% Glycemic control for   <7.0% adults with diabetes     CBG: Recent Labs  Lab 09/26/19 1625 09/26/19 1931 09/27/19 0034 09/27/19 0445 09/27/19 0750  GLUCAP 210* 236* 142* 142* 131*   This patient is critically ill with multiple organ system failure; which, requires frequent high complexity decision making, assessment, support, evaluation, and titration of therapies. This was completed through the application of advanced monitoring technologies and extensive interpretation of multiple databases. During this encounter critical care time was devoted to patient care services described in this note for 56 minutes.  [facility] 09/27/2019 9:02 AM

## 2019-09-27 NOTE — Progress Notes (Signed)
ANTICOAGULATION CONSULT NOTE - Follow Up Consult  Pharmacy Consult for heparin Indication: ECMO  Assessment: 44yo male on VV- ECMO.  Heparin level slightly above goal this morning. No bleeding issues noted by nursing.Hg has dropped to 8.6  Goal of Therapy:  Heparin level 0.3-0.5 units/ml (aiming for lowest end of range)    Plan: -Decrease heparin to 1950 units/hr -Recheck heparin level this afternoon Thanks for allowing pharmacy to be a part of this patient's care.  Parke Simmers Clinical Pharmacist 09/27/2019 5:30 AM

## 2019-09-27 NOTE — Progress Notes (Signed)
Sweep Trial   Myself, the bedside RN, Dr. Valeta Harms and ECMO coordinator @ bedside.  Ventilator optimized at FIO2 50%, Peep 15, R 20, PC 15 for 15 minutes prior to drawing pre sweep trial ABG @ 12:25. Sweep trail began at 1228.  ABG drawn at 10 minutes, see 1238 ABG results. Ventilator settings adjusted to PS/CPAP  Fi02 40%  8 PEEP PS 15. Pt tolerated setting better.Tidal Volume improved from 350s to 430s. ABG drawn at 12:58 see results below. O2 sats remained 93-97%  Sweep trial ended at 1300. Post sweep trial abg noted also 1hr post return sweep of 8.      Results for [name], [name] (MRN [id number]) as of 09/27/2019 14:29  Ref. Range 09/27/2019 12:27 09/27/2019 12:41 09/27/2019 12:58 09/27/2019 14:07  Sample type Unknown ARTERIAL ARTERIAL ARTERIAL ARTERIAL  pH, Arterial Latest Ref Range: 7.350 - 7.450  7.441 7.334 (L) 7.317 (L) 7.414  pCO2 arterial Latest Ref Range: 32.0 - 48.0 mmHg 48.7 (H) 63.5 (H) 66.9 (HH) 52.3 (H)  pO2, Arterial Latest Ref Range: 83.0 - 108.0 mmHg 155.0 (H) 92.0 67.0 (L) 126.0 (H)  TCO2 Latest Ref Range: 22 - 32 mmol/L 35 (H) 36 (H) 36 (H) 35 (H)  Acid-Base Excess Latest Ref Range: 0.0 - 2.0 mmol/L 8.0 (H) 7.0 (H) 7.0 (H) 8.0 (H)  Bicarbonate Latest Ref Range: 20.0 - 28.0 mmol/L 33.1 (H) 33.8 (H) 34.2 (H) 33.5 (H)  O2 Saturation Latest Units: % 99.0 96.0 90.0 99.0  Patient temperature Unknown 37.0 C 37.0 C 37.1 C 37.0 C  Sodium Latest Ref Range: 135 - 145 mmol/L 154 (H) 154 (H) 155 (H) 157 (H)  Potassium Latest Ref Range: 3.5 - 5.1 mmol/L 4.4 4.3 4.5 4.4  Calcium Ionized Latest Ref Range: 1.15 - 1.40 mmol/L 1.29 1.33 1.34 1.31  Hemoglobin Latest Ref Range: 13.0 - 17.0 g/dL 9.5 (L) 9.5 (L) 10.2 (L) 9.5 (L)  HCT Latest Ref Range: 39.0 - 52.0 % 28.0 (L) 28.0 (L) 30.0 (L) 28.0 (L)

## 2019-09-27 NOTE — Plan of Care (Signed)
  Problem: Respiratory: Goal: Ability to maintain a clear airway and adequate ventilation will improve Outcome: Progressing Note: With trach in place.   Problem: Clinical Measurements: Goal: Ability to maintain clinical measurements within normal limits will improve Outcome: Progressing   Problem: Nutrition: Goal: Adequate nutrition will be maintained Outcome: Progressing   Problem: Elimination: Goal: Will not experience complications related to bowel motility Outcome: Progressing

## 2019-09-27 NOTE — CV Procedure (Signed)
   ECMO NOTE:  Indication: Respiratory failure due to COVID PNA  Initial cannulation date: 08/03/2019  ECMO type: VV ECMO (Cardio-Help)  Dual lumen  Inflow/return cannula:  1) 32 FR Crescent placed 2/2  ECMO events:  Initial cannulation 12/26 Circuit changed and trach exchanged on 1/8.   Underwent addition of a RIJ drainage cannula on 1/20 Circuit changed on 1/22 for rising LDH Had evidence of recirculation and RIJ cannula pulled back on 1/26 19 FR RIJ removed 09/06/19 Underwent change to [facility] catheter with removal of femoral lines 2/2 Sweep trial 2/14. 30 mins  PCO2 55 -> 75  Daily data:  Remains on VV ecmo with good flows.    Flow 4.89 RPM 3600 dP 38 Pven  -85 Sweep  8 SVO2 79%  Labs:  Hgb 8.6 LDH 355 Heparin level: 0.54 Lactic acid 1.1  Plan:  Crescent cannula working well Excellent flow and sats. Developed recurrent PNA and sepsis 2/15.  Cx with serratia. Continue meropenum and inhaled tobra.  Continue to daily sweep trials Keep I/Os slightly even   Glori Bickers, MD  8:34 AM

## 2019-09-28 ENCOUNTER — Inpatient Hospital Stay (HOSPITAL_COMMUNITY): Payer: Managed Care, Other (non HMO)

## 2019-09-28 LAB — POCT I-STAT 7, (LYTES, BLD GAS, ICA,H+H)
Acid-Base Excess: 5 mmol/L — ABNORMAL HIGH (ref 0.0–2.0)
Acid-Base Excess: 5 mmol/L — ABNORMAL HIGH (ref 0.0–2.0)
Acid-Base Excess: 6 mmol/L — ABNORMAL HIGH (ref 0.0–2.0)
Acid-Base Excess: 6 mmol/L — ABNORMAL HIGH (ref 0.0–2.0)
Acid-Base Excess: 6 mmol/L — ABNORMAL HIGH (ref 0.0–2.0)
Acid-Base Excess: 6 mmol/L — ABNORMAL HIGH (ref 0.0–2.0)
Acid-Base Excess: 6 mmol/L — ABNORMAL HIGH (ref 0.0–2.0)
Acid-Base Excess: 6 mmol/L — ABNORMAL HIGH (ref 0.0–2.0)
Acid-Base Excess: 7 mmol/L — ABNORMAL HIGH (ref 0.0–2.0)
Acid-Base Excess: 7 mmol/L — ABNORMAL HIGH (ref 0.0–2.0)
Acid-Base Excess: 7 mmol/L — ABNORMAL HIGH (ref 0.0–2.0)
Acid-Base Excess: 8 mmol/L — ABNORMAL HIGH (ref 0.0–2.0)
Acid-Base Excess: 8 mmol/L — ABNORMAL HIGH (ref 0.0–2.0)
Acid-Base Excess: 8 mmol/L — ABNORMAL HIGH (ref 0.0–2.0)
Bicarbonate: 30.6 mmol/L — ABNORMAL HIGH (ref 20.0–28.0)
Bicarbonate: 31.9 mmol/L — ABNORMAL HIGH (ref 20.0–28.0)
Bicarbonate: 32.2 mmol/L — ABNORMAL HIGH (ref 20.0–28.0)
Bicarbonate: 32.4 mmol/L — ABNORMAL HIGH (ref 20.0–28.0)
Bicarbonate: 32.4 mmol/L — ABNORMAL HIGH (ref 20.0–28.0)
Bicarbonate: 32.7 mmol/L — ABNORMAL HIGH (ref 20.0–28.0)
Bicarbonate: 32.8 mmol/L — ABNORMAL HIGH (ref 20.0–28.0)
Bicarbonate: 32.8 mmol/L — ABNORMAL HIGH (ref 20.0–28.0)
Bicarbonate: 33.3 mmol/L — ABNORMAL HIGH (ref 20.0–28.0)
Bicarbonate: 33.3 mmol/L — ABNORMAL HIGH (ref 20.0–28.0)
Bicarbonate: 33.3 mmol/L — ABNORMAL HIGH (ref 20.0–28.0)
Bicarbonate: 33.4 mmol/L — ABNORMAL HIGH (ref 20.0–28.0)
Bicarbonate: 33.7 mmol/L — ABNORMAL HIGH (ref 20.0–28.0)
Bicarbonate: 33.8 mmol/L — ABNORMAL HIGH (ref 20.0–28.0)
Calcium, Ion: 1.25 mmol/L (ref 1.15–1.40)
Calcium, Ion: 1.28 mmol/L (ref 1.15–1.40)
Calcium, Ion: 1.29 mmol/L (ref 1.15–1.40)
Calcium, Ion: 1.3 mmol/L (ref 1.15–1.40)
Calcium, Ion: 1.3 mmol/L (ref 1.15–1.40)
Calcium, Ion: 1.31 mmol/L (ref 1.15–1.40)
Calcium, Ion: 1.31 mmol/L (ref 1.15–1.40)
Calcium, Ion: 1.31 mmol/L (ref 1.15–1.40)
Calcium, Ion: 1.31 mmol/L (ref 1.15–1.40)
Calcium, Ion: 1.32 mmol/L (ref 1.15–1.40)
Calcium, Ion: 1.32 mmol/L (ref 1.15–1.40)
Calcium, Ion: 1.32 mmol/L (ref 1.15–1.40)
Calcium, Ion: 1.33 mmol/L (ref 1.15–1.40)
Calcium, Ion: 1.33 mmol/L (ref 1.15–1.40)
HCT: 26 % — ABNORMAL LOW (ref 39.0–52.0)
HCT: 26 % — ABNORMAL LOW (ref 39.0–52.0)
HCT: 27 % — ABNORMAL LOW (ref 39.0–52.0)
HCT: 28 % — ABNORMAL LOW (ref 39.0–52.0)
HCT: 29 % — ABNORMAL LOW (ref 39.0–52.0)
HCT: 29 % — ABNORMAL LOW (ref 39.0–52.0)
HCT: 29 % — ABNORMAL LOW (ref 39.0–52.0)
HCT: 29 % — ABNORMAL LOW (ref 39.0–52.0)
HCT: 29 % — ABNORMAL LOW (ref 39.0–52.0)
HCT: 29 % — ABNORMAL LOW (ref 39.0–52.0)
HCT: 29 % — ABNORMAL LOW (ref 39.0–52.0)
HCT: 30 % — ABNORMAL LOW (ref 39.0–52.0)
HCT: 30 % — ABNORMAL LOW (ref 39.0–52.0)
HCT: 31 % — ABNORMAL LOW (ref 39.0–52.0)
Hemoglobin: 10.2 g/dL — ABNORMAL LOW (ref 13.0–17.0)
Hemoglobin: 10.2 g/dL — ABNORMAL LOW (ref 13.0–17.0)
Hemoglobin: 10.5 g/dL — ABNORMAL LOW (ref 13.0–17.0)
Hemoglobin: 8.8 g/dL — ABNORMAL LOW (ref 13.0–17.0)
Hemoglobin: 8.8 g/dL — ABNORMAL LOW (ref 13.0–17.0)
Hemoglobin: 9.2 g/dL — ABNORMAL LOW (ref 13.0–17.0)
Hemoglobin: 9.5 g/dL — ABNORMAL LOW (ref 13.0–17.0)
Hemoglobin: 9.9 g/dL — ABNORMAL LOW (ref 13.0–17.0)
Hemoglobin: 9.9 g/dL — ABNORMAL LOW (ref 13.0–17.0)
Hemoglobin: 9.9 g/dL — ABNORMAL LOW (ref 13.0–17.0)
Hemoglobin: 9.9 g/dL — ABNORMAL LOW (ref 13.0–17.0)
Hemoglobin: 9.9 g/dL — ABNORMAL LOW (ref 13.0–17.0)
Hemoglobin: 9.9 g/dL — ABNORMAL LOW (ref 13.0–17.0)
Hemoglobin: 9.9 g/dL — ABNORMAL LOW (ref 13.0–17.0)
O2 Saturation: 93 %
O2 Saturation: 93 %
O2 Saturation: 94 %
O2 Saturation: 94 %
O2 Saturation: 95 %
O2 Saturation: 95 %
O2 Saturation: 95 %
O2 Saturation: 96 %
O2 Saturation: 96 %
O2 Saturation: 96 %
O2 Saturation: 96 %
O2 Saturation: 97 %
O2 Saturation: 97 %
O2 Saturation: 97 %
Patient temperature: 36.8
Patient temperature: 36.8
Patient temperature: 36.8
Patient temperature: 36.9
Patient temperature: 37
Patient temperature: 37
Patient temperature: 37
Patient temperature: 37
Patient temperature: 37
Patient temperature: 37
Patient temperature: 98.3
Patient temperature: 98.5
Patient temperature: 98.7
Patient temperature: 98.8
Potassium: 4.3 mmol/L (ref 3.5–5.1)
Potassium: 4.4 mmol/L (ref 3.5–5.1)
Potassium: 4.6 mmol/L (ref 3.5–5.1)
Potassium: 4.7 mmol/L (ref 3.5–5.1)
Potassium: 4.7 mmol/L (ref 3.5–5.1)
Potassium: 4.7 mmol/L (ref 3.5–5.1)
Potassium: 4.7 mmol/L (ref 3.5–5.1)
Potassium: 4.7 mmol/L (ref 3.5–5.1)
Potassium: 4.8 mmol/L (ref 3.5–5.1)
Potassium: 4.8 mmol/L (ref 3.5–5.1)
Potassium: 4.8 mmol/L (ref 3.5–5.1)
Potassium: 4.8 mmol/L (ref 3.5–5.1)
Potassium: 4.8 mmol/L (ref 3.5–5.1)
Potassium: 4.9 mmol/L (ref 3.5–5.1)
Sodium: 153 mmol/L — ABNORMAL HIGH (ref 135–145)
Sodium: 153 mmol/L — ABNORMAL HIGH (ref 135–145)
Sodium: 154 mmol/L — ABNORMAL HIGH (ref 135–145)
Sodium: 154 mmol/L — ABNORMAL HIGH (ref 135–145)
Sodium: 154 mmol/L — ABNORMAL HIGH (ref 135–145)
Sodium: 155 mmol/L — ABNORMAL HIGH (ref 135–145)
Sodium: 155 mmol/L — ABNORMAL HIGH (ref 135–145)
Sodium: 155 mmol/L — ABNORMAL HIGH (ref 135–145)
Sodium: 155 mmol/L — ABNORMAL HIGH (ref 135–145)
Sodium: 155 mmol/L — ABNORMAL HIGH (ref 135–145)
Sodium: 155 mmol/L — ABNORMAL HIGH (ref 135–145)
Sodium: 155 mmol/L — ABNORMAL HIGH (ref 135–145)
Sodium: 155 mmol/L — ABNORMAL HIGH (ref 135–145)
Sodium: 155 mmol/L — ABNORMAL HIGH (ref 135–145)
TCO2: 32 mmol/L (ref 22–32)
TCO2: 33 mmol/L — ABNORMAL HIGH (ref 22–32)
TCO2: 34 mmol/L — ABNORMAL HIGH (ref 22–32)
TCO2: 34 mmol/L — ABNORMAL HIGH (ref 22–32)
TCO2: 34 mmol/L — ABNORMAL HIGH (ref 22–32)
TCO2: 35 mmol/L — ABNORMAL HIGH (ref 22–32)
TCO2: 35 mmol/L — ABNORMAL HIGH (ref 22–32)
TCO2: 35 mmol/L — ABNORMAL HIGH (ref 22–32)
TCO2: 35 mmol/L — ABNORMAL HIGH (ref 22–32)
TCO2: 35 mmol/L — ABNORMAL HIGH (ref 22–32)
TCO2: 35 mmol/L — ABNORMAL HIGH (ref 22–32)
TCO2: 35 mmol/L — ABNORMAL HIGH (ref 22–32)
TCO2: 35 mmol/L — ABNORMAL HIGH (ref 22–32)
TCO2: 36 mmol/L — ABNORMAL HIGH (ref 22–32)
pCO2 arterial: 44.5 mmHg (ref 32.0–48.0)
pCO2 arterial: 44.6 mmHg (ref 32.0–48.0)
pCO2 arterial: 45.4 mmHg (ref 32.0–48.0)
pCO2 arterial: 48.5 mmHg — ABNORMAL HIGH (ref 32.0–48.0)
pCO2 arterial: 51.3 mmHg — ABNORMAL HIGH (ref 32.0–48.0)
pCO2 arterial: 52.3 mmHg — ABNORMAL HIGH (ref 32.0–48.0)
pCO2 arterial: 53.6 mmHg — ABNORMAL HIGH (ref 32.0–48.0)
pCO2 arterial: 61.7 mmHg — ABNORMAL HIGH (ref 32.0–48.0)
pCO2 arterial: 64.7 mmHg — ABNORMAL HIGH (ref 32.0–48.0)
pCO2 arterial: 64.8 mmHg — ABNORMAL HIGH (ref 32.0–48.0)
pCO2 arterial: 64.9 mmHg — ABNORMAL HIGH (ref 32.0–48.0)
pCO2 arterial: 65 mmHg — ABNORMAL HIGH (ref 32.0–48.0)
pCO2 arterial: 65.3 mmHg (ref 32.0–48.0)
pCO2 arterial: 65.4 mmHg (ref 32.0–48.0)
pH, Arterial: 7.312 — ABNORMAL LOW (ref 7.350–7.450)
pH, Arterial: 7.313 — ABNORMAL LOW (ref 7.350–7.450)
pH, Arterial: 7.315 — ABNORMAL LOW (ref 7.350–7.450)
pH, Arterial: 7.317 — ABNORMAL LOW (ref 7.350–7.450)
pH, Arterial: 7.318 — ABNORMAL LOW (ref 7.350–7.450)
pH, Arterial: 7.322 — ABNORMAL LOW (ref 7.350–7.450)
pH, Arterial: 7.332 — ABNORMAL LOW (ref 7.350–7.450)
pH, Arterial: 7.4 (ref 7.350–7.450)
pH, Arterial: 7.407 (ref 7.350–7.450)
pH, Arterial: 7.408 (ref 7.350–7.450)
pH, Arterial: 7.443 (ref 7.350–7.450)
pH, Arterial: 7.446 (ref 7.350–7.450)
pH, Arterial: 7.455 — ABNORMAL HIGH (ref 7.350–7.450)
pH, Arterial: 7.467 — ABNORMAL HIGH (ref 7.350–7.450)
pO2, Arterial: 105 mmHg (ref 83.0–108.0)
pO2, Arterial: 63 mmHg — ABNORMAL LOW (ref 83.0–108.0)
pO2, Arterial: 64 mmHg — ABNORMAL LOW (ref 83.0–108.0)
pO2, Arterial: 66 mmHg — ABNORMAL LOW (ref 83.0–108.0)
pO2, Arterial: 70 mmHg — ABNORMAL LOW (ref 83.0–108.0)
pO2, Arterial: 78 mmHg — ABNORMAL LOW (ref 83.0–108.0)
pO2, Arterial: 81 mmHg — ABNORMAL LOW (ref 83.0–108.0)
pO2, Arterial: 83 mmHg (ref 83.0–108.0)
pO2, Arterial: 85 mmHg (ref 83.0–108.0)
pO2, Arterial: 91 mmHg (ref 83.0–108.0)
pO2, Arterial: 91 mmHg (ref 83.0–108.0)
pO2, Arterial: 92 mmHg (ref 83.0–108.0)
pO2, Arterial: 94 mmHg (ref 83.0–108.0)
pO2, Arterial: 97 mmHg (ref 83.0–108.0)

## 2019-09-28 LAB — GLUCOSE, CAPILLARY
Glucose-Capillary: 116 mg/dL — ABNORMAL HIGH (ref 70–99)
Glucose-Capillary: 176 mg/dL — ABNORMAL HIGH (ref 70–99)
Glucose-Capillary: 122 mg/dL — ABNORMAL HIGH (ref 70–99)
Glucose-Capillary: 161 mg/dL — ABNORMAL HIGH (ref 70–99)
Glucose-Capillary: 154 mg/dL — ABNORMAL HIGH (ref 70–99)

## 2019-09-28 LAB — TYPE AND SCREEN
ABO/RH(D): O POS
Antibody Screen: NEGATIVE
Unit division: 0
Unit division: 0
Unit division: 0
Unit division: 0
Unit division: 0
Unit division: 0

## 2019-09-28 LAB — BASIC METABOLIC PANEL
Anion gap: 9 (ref 5–15)
Anion gap: 8 (ref 5–15)
BUN: 92 mg/dL — ABNORMAL HIGH (ref 6–20)
BUN: 95 mg/dL — ABNORMAL HIGH (ref 6–20)
CO2: 30 mmol/L (ref 22–32)
CO2: 30 mmol/L (ref 22–32)
Calcium: 8.9 mg/dL (ref 8.9–10.3)
Calcium: 9.1 mg/dL (ref 8.9–10.3)
Chloride: 114 mmol/L — ABNORMAL HIGH (ref 98–111)
Chloride: 114 mmol/L — ABNORMAL HIGH (ref 98–111)
Creatinine, Ser: 0.88 mg/dL (ref 0.61–1.24)
Creatinine, Ser: 0.77 mg/dL (ref 0.61–1.24)
GFR calc Af Amer: >60 mL/min (ref >60)
GFR calc Af Amer: >60 mL/min (ref >60)
GFR calc non Af Amer: >60 mL/min (ref >60)
GFR calc non Af Amer: >60 mL/min (ref >60)
Glucose, Bld: 173 mg/dL — ABNORMAL HIGH (ref 70–99)
Glucose, Bld: 156 mg/dL — ABNORMAL HIGH (ref 70–99)
Potassium: 4.4 mmol/L (ref 3.5–5.1)
Potassium: 4.8 mmol/L (ref 3.5–5.1)
Sodium: 153 mmol/L — ABNORMAL HIGH (ref 135–145)
Sodium: 152 mmol/L — ABNORMAL HIGH (ref 135–145)

## 2019-09-28 LAB — COOXEMETRY PANEL
Carboxyhemoglobin: 3 % — ABNORMAL HIGH (ref 0.5–1.5)
Methemoglobin: 1.1 % (ref 0.0–1.5)
O2 Saturation: 78.2 %
Total hemoglobin: 8.7 g/dL — ABNORMAL LOW (ref 12.0–16.0)

## 2019-09-28 LAB — BPAM RBC
Blood Product Expiration Date: 202103152359
Blood Product Expiration Date: 202103152359
Blood Product Expiration Date: 202103152359
Blood Product Expiration Date: 202103162359
Blood Product Expiration Date: 202103182359
Blood Product Expiration Date: 202103182359
ISSUE DATE / TIME: 202102161823
ISSUE DATE / TIME: 202102162302
Unit Type and Rh: 5100
Unit Type and Rh: 5100
Unit Type and Rh: 5100
Unit Type and Rh: 5100
Unit Type and Rh: 5100
Unit Type and Rh: 5100

## 2019-09-28 LAB — CULTURE, BLOOD (ROUTINE X 2)
Culture: NO GROWTH
Culture: NO GROWTH
Special Requests: ADEQUATE
Special Requests: ADEQUATE

## 2019-09-28 LAB — CBC
HCT: 30.5 % — ABNORMAL LOW (ref 39.0–52.0)
HCT: 31 % — ABNORMAL LOW (ref 39.0–52.0)
Hemoglobin: 8.7 g/dL — ABNORMAL LOW (ref 13.0–17.0)
Hemoglobin: 8.7 g/dL — ABNORMAL LOW (ref 13.0–17.0)
MCH: 27.8 pg (ref 26.0–34.0)
MCH: 27.3 pg (ref 26.0–34.0)
MCHC: 28.5 g/dL — ABNORMAL LOW (ref 30.0–36.0)
MCHC: 28.1 g/dL — ABNORMAL LOW (ref 30.0–36.0)
MCV: 97.4 fL (ref 80.0–100.0)
MCV: 97.2 fL (ref 80.0–100.0)
Platelets: 130 10*3/uL — ABNORMAL LOW (ref 150–400)
Platelets: 130 10*3/uL — ABNORMAL LOW (ref 150–400)
RBC: 3.13 MIL/uL — ABNORMAL LOW (ref 4.22–5.81)
RBC: 3.19 MIL/uL — ABNORMAL LOW (ref 4.22–5.81)
RDW: 17.2 % — ABNORMAL HIGH (ref 11.5–15.5)
RDW: 17.1 % — ABNORMAL HIGH (ref 11.5–15.5)
WBC: 12 10*3/uL — ABNORMAL HIGH (ref 4.0–10.5)
WBC: 12.5 10*3/uL — ABNORMAL HIGH (ref 4.0–10.5)
nRBC: 0.2 % (ref 0.0–0.2)
nRBC: 0.2 % (ref 0.0–0.2)

## 2019-09-28 LAB — MAGNESIUM: Magnesium: 2.2 mg/dL (ref 1.7–2.4)

## 2019-09-28 LAB — LACTATE DEHYDROGENASE: LDH: 373 U/L — ABNORMAL HIGH (ref 98–192)

## 2019-09-28 LAB — HEPARIN LEVEL (UNFRACTIONATED)
Heparin Unfractionated: 0.53 IU/mL (ref 0.30–0.70)
Heparin Unfractionated: 0.48 IU/mL (ref 0.30–0.70)

## 2019-09-28 LAB — LACTIC ACID, PLASMA: Lactic Acid, Venous: 1.1 mmol/L (ref 0.5–1.9)

## 2019-09-28 LAB — MRSA PCR SCREENING: MRSA by PCR: NEGATIVE

## 2019-09-28 LAB — APTT
aPTT: 91 seconds — ABNORMAL HIGH (ref 24–36)
aPTT: 77 seconds — ABNORMAL HIGH (ref 24–36)

## 2019-09-28 LAB — PHOSPHORUS: Phosphorus: 3.5 mg/dL (ref 2.5–4.6)

## 2019-09-28 LAB — FIBRINOGEN: Fibrinogen: 482 mg/dL — ABNORMAL HIGH (ref 210–475)

## 2019-09-28 MED ORDER — HYDROMORPHONE HCL 2 MG PO TABS
2.0000 mg | ORAL_TABLET | Freq: Four times a day (QID) | ORAL | Status: DC
  Administered 2019-09-28 – 2019-09-30 (×7): 2 mg
  Filled 2019-09-28 (×7): qty 1

## 2019-09-28 MED ORDER — DEXTROSE 5 % IV SOLN
500.0000 mg | Freq: Once | INTRAVENOUS | Status: AC
  Administered 2019-09-28: 500 mg via INTRAVENOUS
  Filled 2019-09-28: qty 500

## 2019-09-28 MED ORDER — HYDROMORPHONE HCL 2 MG PO TABS: 2.0000 mg | ORAL_TABLET | Freq: Four times a day (QID) | ORAL | Status: DC | PRN

## 2019-09-28 MED ORDER — FREE WATER
500.0000 mL | Freq: Four times a day (QID) | Status: DC
  Administered 2019-09-28 – 2019-10-03 (×20): 500 mL

## 2019-09-28 MED ORDER — DEXTROSE 5 % IV SOLN
500.0000 mg | Freq: Two times a day (BID) | INTRAVENOUS | Status: DC
  Filled 2019-09-28 (×2): qty 500

## 2019-09-28 MED ORDER — DIAZEPAM 5 MG PO TABS
2.5000 mg | ORAL_TABLET | Freq: Two times a day (BID) | ORAL | Status: DC | PRN
  Administered 2019-09-28 – 2019-10-31 (×13): 2.5 mg
  Filled 2019-09-28 (×16): qty 1

## 2019-09-28 NOTE — Progress Notes (Signed)
NAME:  [name], MRN:  [id number], DOB:  06-09-1976, LOS: 69 ADMISSION DATE:  07/22/2019, CONSULTATION DATE:  12/24 REFERRING MD:  Sloan Leiter, CHIEF COMPLAINT:  Dyspnea   Brief History   44 y/o male admitted 12/14 with COVID pneumonia leading to ARDS, pneumomediastinum.  Intubated 12/26, bilateral chest tubes placed.  Past Medical History  GERD HTN Asthma  Significant Hospital Events   12/14 admit with hypoxemic respiratory failure in setting of COVID-19 pneumonia 12/23 noted to have pneumomediastinum on chest x-ray 12/24PCCM consulted for evaluation 12/26 worsening hypoxemia, intubated 12/27 VV fem/fem ECMO cannulation 12/29 tracheostomy >> See bedside nursing event notes for full rundown of procedures after 12/27 1/5 epistaxis, nasal packing 1/30 improved cxr, improved lung compliance, TV 350s on 15PC  1/31 CXR increased infiltrates, +CFB     2/2 Converted to right IJ Crescent cannula. 2/3 PEG tube inserted at bedside 2/14 30 min sweep trial> paO2 105 (18 PEEP, 60% FiO2) pH 7.22, pCO2 87  Consults:  PCCM, CTS, CHF, Palliative  Procedures:  PICC 1/3 >> R radial a-line 1/20 >> R IJ ECMO cath 2/3 >> Tracheostomy 12/29 >>  Significant Diagnostic Tests:  CT chest 12/22>>extensive pneumomediastinum, multifocal patchy bilateral groundglass opacities CXR 1/25 >> improving bilateral infiltrates. ECMO cannulae in place. CXR 2/5 >> clearing bilateral infiltrates  Micro Data:  BCx2 12/14 >>negative  Sputum 12/15 >> OPF 12/28 blood > negative 12/29 > staph aureus 1/4 bronch:  1/2 acid fast smear bronch: negative 1/2 fungal cx bronch: negative1/12 BAL: serratia, staph epidermidisu 1/4 BAL > MSSA, Serratia 1/9 blood > negative 1/9 Resp culture > MSSA, serratia 1/11 serratia > negative 1/12 fungus culture > negative 1/12 BAL > serratia 1/18 BAL >> rare Serratia. 1/24 blood > negative 1/30 blood > negative 1/30 resp culture > serratia 2/1 BAL >> rare Serratia -  enterococcus 2/1 aspergillus Ag > negative 2/2 resp culture > serratia  2/2 blood > negative 2/5 urine > negative 2/9 resp: serratia 2/15 BAL serratia  Antimicrobials:  Received remdesivir, steroids, actemra and convalescent plasma  5 days cefepime 12/24-12/29 Cefepime 1/4->1/13 vanc 1/4> 1/11 Ceftriaxone 1/14 > 1/27 vanc x1 1/20 Cefazolin 1/29 > 2/2 cefuroxim 2/2 vanc 2/2 Ceftriaxone x1 2/4 Meropenem 2/15>> Tobramycin inhaled 2/15>>  Interim history/subjective:   No events overnight.  Triggering vent.  Remains on VV ECMO.  Objective   Blood pressure (!) 152/67, pulse 95, temperature 98.7 F (37.1 C), temperature source Oral, resp. rate 18, height _0  (1.727 m), weight 129.4 kg, SpO2 97 %. CVP:  [6 mmHg-7 mmHg] 6 mmHg  Vent Mode: PCV FiO2 (%):  [30 %-50 %] 30 % Set Rate:  [15 bmp] 15 bmp PEEP:  [15 cmH20] 15 cmH20 Pressure Support:  [15 cmH20] 15 cmH20 Plateau Pressure:  [20 cmH20-29 cmH20] 27 cmH20   Intake/Output Summary (Last 24 hours) at 09/28/2019 8850 Last data filed at 09/28/2019 0700 Gross per 24 hour  Intake 3805.46 ml  Output 3955 ml  Net -149.54 ml   Filed Weights   09/25/19 0500 09/26/19 0600 09/27/19 0500  Weight: 134.3 kg 132.1 kg 129.4 kg    Examination: GEN: ill appearing man on vent HEENT: RIJ  CV: RRR, ext warm PULM: Coarse bilaterally, shallow GI: Soft, PEG CDI EXT: Diffuse anasarca NEURO: Opens eyes to voice, will w/d per nursing PSYCH: RASS -1  SKIN: Pale  ABG 7.44/48/70 Sweep 7 LPM 1.0 PC 15/15/0.3/15   Flow (LPM): 5.16   Resolved Hospital Problem list     Assessment &  Plan:   ARDS due to COVID 19, requiring mechanical ventilation and VV ECMO  - Improving, lingering issues with HCAP vs. Bronchitis and profound deconditioning. - Daily sweep trials  Pneumothoraces-resolved. -Chest tubes removed  AKI with azotemia Positive cumulative fluid balance BUN is slowly improving Hypernatremia Severe third spacing -FW  replacement - attempting to maintain neg fluid balance  - dose of diuril today  Blood tinged GI secretions > none on 2/15 -no evidence of active bleeding   Sedation needs - weaning as tolerated  - drop valium to PRN - nothing continuous ongoing  Hypertension - clonidine and propanolol  - may need to consider decreasing clonidine dosing if he is still having sedative effect   Serratia pneumonia> treated Stop date 2/22  Neuro muscular weakness Appreciate PT input.  For range of motion  Bilateral lower feet deep tissue injury Wound care following   Best practice:  Pain/Anxiety/Delirium protocol (if indicated): see above VAP protocol (if indicated): Bundle in place. Respiratory support goals: Lung protective ventilation strategy DVT prophylaxis: Heparin drip, goal 0.3-0.5 GI prophylaxis: PPI Nutrition: TF Fluid status goals:  Attempting to maintain negative fluid balance Urinary catheter: Guide hemodynamic management Glucose control: controlled with current regimen Mobility/therapy needs: Bedrest.  Daily labs: as per ECMO protocol. Code Status: Full code  Family Communication: Wife updated 2/13 Disposition: ICU  Labs   CBC: CBC Latest Ref Rng & Units 09/28/2019 09/28/2019 09/28/2019  WBC 4.0 - 10.5 K/uL - - 12.0(H)  Hemoglobin 13.0 - 17.0 g/dL 10.5(L) 9.2(L) 8.7(L)  Hematocrit 39.0 - 52.0 % 31.0(L) 27.0(L) 30.5(L)  Platelets 150 - 400 K/uL - - 130(L)     Basic Metabolic Panel: BMP Latest Ref Rng & Units 09/28/2019 09/28/2019 09/28/2019  Glucose 70 - 99 mg/dL - - 173(H)  BUN 6 - 20 mg/dL - - 92(H)  Creatinine 0.61 - 1.24 mg/dL - - 0.88  Sodium 135 - 145 mmol/L 155(H) 155(H) 153(H)  Potassium 3.5 - 5.1 mmol/L 4.6 4.4 4.4  Chloride 98 - 111 mmol/L - - 114(H)  CO2 22 - 32 mmol/L - - 30  Calcium 8.9 - 10.3 mg/dL - - 8.9     GFR: Estimated Creatinine Clearance: 139.1 mL/min (by C-G formula based on SCr of 0.88 mg/dL). Recent Labs  Lab 09/25/19 0400 09/25/19 1630  09/26/19 0433 09/26/19 0433 09/26/19 1645 09/27/19 0458 09/27/19 0459 09/27/19 1708 09/28/19 0455  WBC  --    < > 13.3*   < > 15.0* 11.0*  --  13.6* 12.0*  LATICACIDVEN 1.4  --  1.3  --   --   --  1.1  --  1.1   < > = values in this interval not displayed.    Liver Function Tests: No results for input(s): AST, ALT, ALKPHOS, BILITOT, PROT, ALBUMIN in the last 168 hours. No results for input(s): LIPASE, AMYLASE in the last 168 hours. No results for input(s): AMMONIA in the last 168 hours.  ABG    Component Value Date/Time   PHART 7.446 09/28/2019 0632   PCO2ART 48.5 (H) 09/28/2019 0632   PO2ART 70.0 (L) 09/28/2019 0632   HCO3 33.4 (H) 09/28/2019 0632   TCO2 35 (H) 09/28/2019 0632   ACIDBASEDEF 4.0 (H) 09/16/2019 2334   O2SAT 94.0 09/28/2019 0632     Coagulation Profile: No results for input(s): INR, PROTIME in the last 168 hours.  Cardiac Enzymes: No results for input(s): CKTOTAL, CKMB, CKMBINDEX, TROPONINI in the last 168 hours.  HbA1C: Hgb A1c MFr Bld  Date/Time Value Ref Range Status  09/23/2019 08:58 AM 5.5 4.8 - 5.6 % Final    Comment:    (NOTE) Pre diabetes:          5.7%-6.4% Diabetes:              >6.4% Glycemic control for   <7.0% adults with diabetes   07/24/2019 04:50 AM 6.7 (H) 4.8 - 5.6 % Final    Comment:    (NOTE) Pre diabetes:          5.7%-6.4% Diabetes:              >6.4% Glycemic control for   <7.0% adults with diabetes     CBG: Recent Labs  Lab 09/27/19 1130 09/27/19 1510 09/27/19 1934 09/27/19 2310 09/28/19 0309  GLUCAP 176* 164* 156* 116* 176*   This patient is critically ill with multiple organ system failure; which, requires frequent high complexity decision making, assessment, support, evaluation, and titration of therapies. This was completed through the application of advanced monitoring technologies and extensive interpretation of multiple databases. During this encounter critical care time was devoted to patient care  services described in this note for 35 minutes.  Candee Furbish, MD [facility] 09/28/2019 7:02 AM

## 2019-09-28 NOTE — Progress Notes (Signed)
Suctioning and breathing tx held at this time do to [facility] test. Pt stable.

## 2019-09-28 NOTE — Progress Notes (Signed)
ANTICOAGULATION CONSULT NOTE - Follow Up Consult  Pharmacy Consult for heparin Indication: ECMO  Labs: Recent Labs    09/27/19 0458 09/27/19 1227 09/27/19 1708 09/27/19 1719 09/28/19 0455 09/28/19 0455 09/28/19 0512 09/28/19 0632  HGB 8.6*   < > 9.1*   < > 8.7*   < > 9.2* 10.5*  HCT 30.0*   < > 31.9*   < > 30.5*  --  27.0* 31.0*  PLT 122*  --  145*  --  130*  --   --   --   APTT 100*  --  86*  --  91*  --   --   --   HEPARINUNFRC 0.54  --  0.59  --  0.53  --   --   --   CREATININE 0.67  --  0.85  --  0.88  --   --   --    < > = values in this interval not displayed.    Assessment: 44yo male remains supratherapeutic on heparin with very little change in level despite rate change.  Goal of Therapy:  Heparin level 0.3-0.4 units/ml   Plan:  Will decrease heparin gtt to 1700 units/hr and check level in 6 hours.    Wynona Neat, PharmD, BCPS  09/28/2019,6:40 AM

## 2019-09-28 NOTE — Progress Notes (Signed)
Patient ID: [name], male   DOB: September 18, 1975, 44 y.o.   MRN: [id number]      Advanced Heart Failure Rounding Note   Subjective:    Remains on VV ecmo.  BAL growing > 100K serratia. On meropenum and vanc starting 2/15.   CVP 6-7    On vent at 30% TV 350 cc, doing better with sweep trials.   CXR with diffuse infiltrates, unchanged.   Arouses on vent, follows commands per nurse.   ABG 7.45/48.5/70    LDH 355 => 373  hgb 8.7  Sodium 153, BUN/cr 92/0.88.   ECMO parameters 3600 rpm Delta P 37 -93   Objective:   Weight Range:  Vital Signs:   Temp:  [98.3 F (36.8 C)-98.7 F (37.1 C)] 98.7 F (37.1 C) (02/20 0630) Pulse Rate:  [80-96] 95 (02/20 0700) Resp:  [16-33] 18 (02/20 0700) BP: (148-165)/(60-71) 152/67 (02/20 0434) SpO2:  [93 %-100 %] 97 % (02/20 0700) Arterial Line BP: (139-176)/(54-74) 147/71 (02/20 0700) FiO2 (%):  [30 %-50 %] 30 % (02/20 0735) Weight:  [133 kg] 133 kg (02/20 0700) Last BM Date: 09/26/19  Weight change: Filed Weights   09/26/19 0600 09/27/19 0500 09/28/19 0700  Weight: 132.1 kg 129.4 kg 133 kg    Intake/Output:   Intake/Output Summary (Last 24 hours) at 09/28/2019 0803 Last data filed at 09/28/2019 0700 Gross per 24 hour  Intake 3692.4 ml  Output 3955 ml  Net -262.6 ml     Physical Exam: General: Sedated on vent, will arouse.  Neck: No JVD, no thyromegaly or thyroid nodule.  Lungs: Decreased BS bilaterally CV: Nondisplaced PMI.  Heart regular S1/S2, no S3/S4, no murmur.  1+ ankle edema. Abdomen: Soft, nontender, no hepatosplenomegaly, no distention.  Skin: Intact without lesions or rashes.  Neurologic: Will follow some commands per nurse. Extremities: No clubbing or cyanosis.  HEENT: Normal.   Telemetry: Sinus 80-90s Personally reviewed   Labs: Basic Metabolic Panel: Recent Labs  Lab 09/24/19 0402 09/24/19 0410 09/25/19 2202 09/25/19 0739 09/26/19 0433 09/26/19 0547 09/26/19 1645 09/26/19 2120  09/27/19 0458 09/27/19 1227 09/27/19 1708 09/27/19 1719 09/27/19 2312 09/28/19 0018 09/28/19 0455 09/28/19 0512 09/28/19 0632  NA 146*   < > 147*   < > 151*   < > 149*   < > 153*   < > 149*   < > 155* 154* 153* 155* 155*  K 5.0   < > 4.1   < > 4.2   < > 3.9   < > 4.1   < > 4.2   < > 4.3 4.3 4.4 4.4 4.6  CL 108   < > 110   < > 114*  --  111  --  112*  --  112*  --   --   --  114*  --   --   CO2 27   < > 28   < > 28  --  29  --  32  --  29  --   --   --  30  --   --   GLUCOSE 317*   < > 301*   < > 231*  --  228*  --  147*  --  162*  --   --   --  173*  --   --   BUN 122*   < > 108*   < > 97*  --  95*  --  89*  --  101*  --   --   --  92*  --   --   CREATININE 0.97   < > 0.92   < > 0.78  --  0.83  --  0.67  --  0.85  --   --   --  0.88  --   --   CALCIUM 9.1   < > 9.2   < > 9.2   < > 9.1   < > 9.2  --  9.0  --   --   --  8.9  --   --   MG 2.4  --  2.3  --  2.2  --   --   --  2.5*  --   --   --   --   --  2.2  --   --   PHOS 3.4  --  3.6  --  3.3  --   --   --  3.4  --   --   --   --   --  3.5  --   --    < > = values in this interval not displayed.    Liver Function Tests: No results for input(s): AST, ALT, ALKPHOS, BILITOT, PROT, ALBUMIN in the last 168 hours. No results for input(s): LIPASE, AMYLASE in the last 168 hours. No results for input(s): AMMONIA in the last 168 hours.  CBC: Recent Labs  Lab 09/26/19 0433 09/26/19 0547 09/26/19 1645 09/26/19 2120 09/27/19 0458 09/27/19 1227 09/27/19 1708 09/27/19 1719 09/27/19 2312 09/28/19 0018 09/28/19 0455 09/28/19 0512 09/28/19 0632  WBC 13.3*  --  15.0*  --  11.0*  --  13.6*  --   --   --  12.0*  --   --   HGB 8.6*   < > 8.9*   < > 8.6*   < > 9.1*   < > 9.2* 8.8* 8.7* 9.2* 10.5*  HCT 29.7*   < > 30.8*   < > 30.0*   < > 31.9*   < > 27.0* 26.0* 30.5* 27.0* 31.0*  MCV 94.6  --  96.0  --  95.5  --  96.4  --   --   --  97.4  --   --   PLT 125*  --  138*  --  122*  --  145*  --   --   --  130*  --   --    < > = values in this  interval not displayed.    Cardiac Enzymes: No results for input(s): CKTOTAL, CKMB, CKMBINDEX, TROPONINI in the last 168 hours.  BNP: BNP (last 3 results) Recent Labs    07/22/19 1039  BNP 15.3    ProBNP (last 3 results) No results for input(s): PROBNP in the last 8760 hours.    Other results:  Imaging: DG CHEST PORT 1 VIEW  Result Date: 09/28/2019 CLINICAL DATA:  Cardio respiratory failure EXAM: PORTABLE CHEST 1 VIEW COMPARISON:  Yesterday FINDINGS: Cardiomegaly and vascular pedicle widening. Low volume chest with diffuse patchy infiltrate. Stable cannula related to ECMO. Tracheostomy tube in place. No visible effusion or pneumothorax. IMPRESSION: Stable hardware, inflation, and airspace disease. Electronically Signed   By: Monte Fantasia M.D.   On: 09/28/2019 06:42   DG CHEST PORT 1 VIEW  Result Date: 09/27/2019 CLINICAL DATA:  Tracheostomy.  ECMO. EXAM: PORTABLE CHEST 1 VIEW COMPARISON:  09/26/2019.  09/25/2019. FINDINGS: Tracheostomy tube and ECMO cannula in stable position. Right PICC line in stable position. Stable cardiomegaly. Diffuse unchanged bilateral pulmonary infiltrates/edema. No  prominent pleural effusion noted. No pneumothorax. IMPRESSION: 1. Tracheostomy tube, ECMO cannula, right PICC line stable position. 2.  Stable cardiomegaly. 3.  Unchanged diffuse bilateral pulmonary infiltrates/edema. Electronically Signed   By: Marcello Moores  Register   On: 09/27/2019 07:22     Medications:     Scheduled Medications: . amiodarone  200 mg Oral Daily  . vitamin C  500 mg Per Tube Daily  . chlorhexidine gluconate (MEDLINE KIT)  15 mL Mouth Rinse BID  . Chlorhexidine Gluconate Cloth  6 each Topical Daily  . cloNIDine  0.1 mg Oral Q6H  . feeding supplement (OSMOLITE 1.5 CAL)  237 mL Per Tube 6 X Daily  . feeding supplement (PRO-STAT SUGAR FREE 64)  60 mL Per Tube QID  . free water  500 mL Per Tube Q6H  . gabapentin  100 mg Per Tube Q12H  . HYDROmorphone  2 mg Oral Q6H  .  insulin aspart  0-20 Units Subcutaneous Q4H  . insulin aspart  5 Units Subcutaneous Q4H  . insulin detemir  18 Units Subcutaneous BID  . ipratropium-albuterol  3 mL Nebulization Q4H  . mouth rinse  15 mL Mouth Rinse 10 times per day  . montelukast  10 mg Per Tube QHS  . nutrition supplement (JUVEN)  1 packet Per Tube BID BM  . pantoprazole sodium  40 mg Per Tube BID  . propranolol  40 mg Per Tube TID  . psyllium  1 packet Oral BID  . QUEtiapine  50 mg Per Tube QHS  . sodium chloride flush  10-40 mL Intracatheter Q12H  . sodium chloride flush  10-40 mL Intracatheter Q12H  . tobramycin (PF)  300 mg Nebulization BID  . white petrolatum   Topical BID    Infusions: . sodium chloride    . sodium chloride 10 mL/hr at 09/28/19 0700  . sodium chloride 10 mL/hr at 09/28/19 0700  . chlorothiazide (DIURIL) IV 500 mg (09/28/19 0751)  . dexmedetomidine (PRECEDEX) IV infusion Stopped (09/23/19 1336)  . heparin 1,700 Units/hr (09/28/19 0754)  . meropenem (MERREM) IV Stopped (09/28/19 0600)  . vasopressin (PITRESSIN) infusion - *FOR SHOCK* Stopped (09/24/19 0917)    PRN Medications: Place/Maintain arterial line **AND** sodium chloride, sodium chloride, sodium chloride, acetaminophen (TYLENOL) oral liquid 160 mg/5 mL, acetaminophen, [COMPLETED] diazepam **FOLLOWED BY** diazepam, hydrALAZINE, [COMPLETED] HYDROmorphone **FOLLOWED BY** [COMPLETED] HYDROmorphone **FOLLOWED BY** [COMPLETED] HYDROmorphone **FOLLOWED BY** HYDROmorphone **FOLLOWED BY** [START ON 10/01/2019] HYDROmorphone, magic mouthwash, magnesium hydroxide, midazolam, ondansetron (ZOFRAN) IV, senna-docusate, sodium chloride, sodium chloride flush   Assessment/Plan:    1. Acute hypoxic/hypercarbic respiratory failure due to COVID PNA: Remained markedly hypoxic despite full support. Intubated 12/26. S/p bilateral CTs 12/26 for pneumothoraces.  TEE on 12/26 LVEF 70% RV ok. VV ECMO begun 12/26. Tracheostomy 12/29.  COVID ARDS at this point.  ECMO circuit changed 1/8 & 1/23. Crescent cannula placed 2/2 (femoral cannulas removed) - ECMO parameters as per ECMO note - Flows look good on Crescent - Developed PNA and sepsis on 2/15.  BAL with > 100K serratia. On meropenem and nebulized tobramycin, continue 1 week at least. - Off precedex. Continue to wean oral valium and dilaudid.  - Continue to daily sweep trials, again today.  - D/w CCM would be ok with permissive hypercapnea if he can maintain pH. Have stopped diamox to faciltate metabolic compensation - I/Os even, stable renal function.  Will hold Lasix, getting Diuril per CCM.  - LDH 355 => 373 - Continue heparin gtt.  - Continue chest PT  2. Bilateral PTX: Due to barotrauma.  - Resolved. CTs out  3. COVID PNA: management of COVID infection per CCM. CXR with bilateral multifocal PNA progressing to likely ARDS.  - s/p 10 days of remdesivir - 12/16, 12/2 s/p tociluzimab - 12/17 s/p convalescent plasma - Decadron at 48m/daily completed 1/11 - Completed cefepime/vanc - Off Ancef  4. ID: Empiric coverage with vancomycin/cefepime initially for possible secondary bacterial PNA, course completed.   - treated with cefepime and vanc for + cx with staph epidimidis, MSSA and serratia (sensitivities ok). - developed recurrent PNA and sepsis 2/15 - BAL with > 100K serratia. On mero and vanc - Developed PNA and sepsis on 2/15.  BAL with > 100K serratia. On mero and nebulized tobramycin starting 2/17.   5. Anemia: - hgb 10.5 -> 8.2 -> 7.5 -. 8.3 -> 9.5 -> 8.6 -> 8.7  today. Transfuse as needed to keep Hgb> 8.0  7. FEN - now with G-tube.Continue TFs  8. Hypernatremia - NA 153 - Increase free water to 500 cc q6hr  9. AKI - Volume status looks good. AKI and azotemia improved with IVF and FW  10. DTIs - WOC following.   D/w with ECMO team (ECMO coordinator/specialists, CCM, TCTS and pharmD) at bedside on multidisciplinary rounds. Continue to await ARDS recovery. Details as above.    Length of Stay: 637 CRITICAL CARE Performed by: DLoralie Champagne Total critical care time: 40 minutes  Critical care time was exclusive of separately billable procedures and treating other patients.  Critical care was necessary to treat or prevent imminent or life-threatening deterioration.  Critical care was time spent personally by me (independent of midlevel providers or residents) on the following activities: development of treatment plan with patient and/or surrogate as well as nursing, discussions with consultants, evaluation of patient's response to treatment, examination of patient, obtaining history from patient or surrogate, ordering and performing treatments and interventions, ordering and review of laboratory studies, ordering and review of radiographic studies, pulse oximetry and re-evaluation of patient's condition.    DLoralie ChampagneMD 09/28/2019, 8:02 AM  Advanced Heart Failure Team Pager [phone](M-F; 7a - 4p)  Please contact [facility] for night-coverage after hours (4p -7a ) and weekends on [email or web address]

## 2019-09-28 NOTE — Progress Notes (Signed)
  Sweep Trial begun at 0940. Dr. Tamala Julian maximized ventilator setting. Blood gases were drawn every 15 min. Patient was returned to a sweep of 3 after 120 mins.   Results for [name], [name] (MRN [id number]) as of 09/28/2019 11:44  Ref. Range 09/28/2019 09:42 09/28/2019 10:03 09/28/2019 10:13 09/28/2019 10:26 09/28/2019 10:40 09/28/2019 10:54 09/28/2019 11:06 09/28/2019 11:39 09/28/2019 11:40  Sample type Unknown CARDIOPULMONARY BYPASS ARTERIAL ARTERIAL ARTERIAL ARTERIAL ARTERIAL ARTERIAL  CARDIOPULMONARY BYPASS  pH, Arterial Latest Ref Range: 7.350 - 7.450  7.443 7.332 (L) 7.313 (L) 7.312 (L) 7.318 (L) 7.317 (L) 7.322 (L)  7.315 (L)  pCO2 arterial Latest Ref Range: 32.0 - 48.0 mmHg 44.6 61.7 (H) 64.7 (H) 64.9 (H) 64.8 (H) 65.0 (H) 65.3 (HH)  65.4 (HH)  pO2, Arterial Latest Ref Range: 83.0 - 108.0 mmHg 64.0 (L) 83.0 85.0 91.0 92.0 94.0 97.0  105.0  TCO2 Latest Ref Range: 22 - 32 mmol/L 32 35 (H) 35 (H) 35 (H) 35 (H) 35 (H) 36 (H)  35 (H)  Acid-Base Excess Latest Ref Range: 0.0 - 2.0 mmol/L 6.0 (H) 6.0 (H) 5.0 (H) 5.0 (H) 6.0 (H) 6.0 (H) 6.0 (H)  6.0 (H)  Bicarbonate Latest Ref Range: 20.0 - 28.0 mmol/L 30.6 (H) 32.7 (H) 32.8 (H) 32.8 (H) 33.3 (H) 33.3 (H) 33.8 (H)  33.3 (H)  O2 Saturation Latest Units: % 93.0 95.0 95.0 96.0 96.0 96.0 97.0  97.0  Patient temperature Unknown 36.8 C 36.8 C 36.9 C 37.0 C 37.0 C 37.0 C 37.0 C  37.0 C  Collection site Unknown         ARTERIAL LINE  Sodium Latest Ref Range: 135 - 145 mmol/L 155 (H) 155 (H) 155 (H) 155 (H) 155 (H) 155 (H) 155 (H)  154 (H)  Potassium Latest Ref Range: 3.5 - 5.1 mmol/L 4.7 4.7 4.7 4.8 4.8 4.8 4.8  4.9  Calcium Ionized Latest Ref Range: 1.15 - 1.40 mmol/L 1.28 1.33 1.31 1.32 1.33 1.32 1.31  1.31  Hemoglobin Latest Ref Range: 13.0 - 17.0 g/dL 9.9 (L) 9.5 (L) 10.2 (L) 9.9 (L) 9.9 (L) 9.9 (L) 9.9 (L)  10.2 (L)  HCT Latest Ref Range: 39.0 - 52.0 % 29.0 (L) 28.0 (L) 30.0 (L) 29.0 (L) 29.0 (L) 29.0 (L) 29.0 (L)  30.0 (L)

## 2019-09-28 NOTE — Progress Notes (Signed)
ANTICOAGULATION CONSULT NOTE - Follow Up Consult  Pharmacy Consult for heparin Indication: ECMO  Labs: Recent Labs    09/27/19 1708 09/27/19 1719 09/28/19 0455 09/28/19 0512 09/28/19 1239 09/28/19 1239 09/28/19 1558 09/28/19 1626  HGB 9.1*   < > 8.7*   < > 9.9*   < > 9.9* 8.7*  HCT 31.9*   < > 30.5*   < > 29.0*  --  29.0* 31.0*  PLT 145*  --  130*  --   --   --   --  130*  APTT 86*  --  91*  --   --   --   --  77*  HEPARINUNFRC 0.59  --  0.53  --   --   --   --  0.48  CREATININE 0.85  --  0.88  --   --   --   --  0.77   < > = values in this interval not displayed.    Assessment: 44yo male remains slightly supratherapeutic on heparin with very little change in level despite rate change.  Goal of Therapy:  Heparin level 0.3-0.4 units/ml   Plan:  Will decrease heparin gtt to 1650 units/hr and check level with next ECMO labs (5a/5p).  [name], [facility] Clinical Pharmacist Phone [phone]  09/28/2019 5:36 PM

## 2019-09-29 ENCOUNTER — Inpatient Hospital Stay (HOSPITAL_COMMUNITY): Payer: Managed Care, Other (non HMO)

## 2019-09-29 LAB — CBC
HCT: 30.7 % — ABNORMAL LOW (ref 39.0–52.0)
HCT: 31 % — ABNORMAL LOW (ref 39.0–52.0)
Hemoglobin: 8.7 g/dL — ABNORMAL LOW (ref 13.0–17.0)
Hemoglobin: 8.8 g/dL — ABNORMAL LOW (ref 13.0–17.0)
MCH: 28.1 pg (ref 26.0–34.0)
MCH: 27.8 pg (ref 26.0–34.0)
MCHC: 28.3 g/dL — ABNORMAL LOW (ref 30.0–36.0)
MCHC: 28.4 g/dL — ABNORMAL LOW (ref 30.0–36.0)
MCV: 99 fL (ref 80.0–100.0)
MCV: 98.1 fL (ref 80.0–100.0)
Platelets: 122 10*3/uL — ABNORMAL LOW (ref 150–400)
Platelets: 129 10*3/uL — ABNORMAL LOW (ref 150–400)
RBC: 3.1 MIL/uL — ABNORMAL LOW (ref 4.22–5.81)
RBC: 3.16 MIL/uL — ABNORMAL LOW (ref 4.22–5.81)
RDW: 17.1 % — ABNORMAL HIGH (ref 11.5–15.5)
RDW: 17.2 % — ABNORMAL HIGH (ref 11.5–15.5)
WBC: 11.8 10*3/uL — ABNORMAL HIGH (ref 4.0–10.5)
WBC: 14.5 10*3/uL — ABNORMAL HIGH (ref 4.0–10.5)
nRBC: 0.2 % (ref 0.0–0.2)
nRBC: 0.2 % (ref 0.0–0.2)

## 2019-09-29 LAB — POCT I-STAT 7, (LYTES, BLD GAS, ICA,H+H)
Acid-Base Excess: 8 mmol/L — ABNORMAL HIGH (ref 0.0–2.0)
Acid-Base Excess: 6 mmol/L — ABNORMAL HIGH (ref 0.0–2.0)
Acid-Base Excess: 6 mmol/L — ABNORMAL HIGH (ref 0.0–2.0)
Acid-Base Excess: 6 mmol/L — ABNORMAL HIGH (ref 0.0–2.0)
Acid-Base Excess: 8 mmol/L — ABNORMAL HIGH (ref 0.0–2.0)
Acid-Base Excess: 7 mmol/L — ABNORMAL HIGH (ref 0.0–2.0)
Acid-Base Excess: 8 mmol/L — ABNORMAL HIGH (ref 0.0–2.0)
Acid-Base Excess: 10 mmol/L — ABNORMAL HIGH (ref 0.0–2.0)
Bicarbonate: 33.6 mmol/L — ABNORMAL HIGH (ref 20.0–28.0)
Bicarbonate: 31.2 mmol/L — ABNORMAL HIGH (ref 20.0–28.0)
Bicarbonate: 33.9 mmol/L — ABNORMAL HIGH (ref 20.0–28.0)
Bicarbonate: 33.9 mmol/L — ABNORMAL HIGH (ref 20.0–28.0)
Bicarbonate: 34 mmol/L — ABNORMAL HIGH (ref 20.0–28.0)
Bicarbonate: 33.9 mmol/L — ABNORMAL HIGH (ref 20.0–28.0)
Bicarbonate: 34.2 mmol/L — ABNORMAL HIGH (ref 20.0–28.0)
Bicarbonate: 35 mmol/L — ABNORMAL HIGH (ref 20.0–28.0)
Calcium, Ion: 1.37 mmol/L (ref 1.15–1.40)
Calcium, Ion: 1.25 mmol/L (ref 1.15–1.40)
Calcium, Ion: 1.34 mmol/L (ref 1.15–1.40)
Calcium, Ion: 1.33 mmol/L (ref 1.15–1.40)
Calcium, Ion: 1.31 mmol/L (ref 1.15–1.40)
Calcium, Ion: 1.31 mmol/L (ref 1.15–1.40)
Calcium, Ion: 1.29 mmol/L (ref 1.15–1.40)
Calcium, Ion: 1.29 mmol/L (ref 1.15–1.40)
HCT: 26 % — ABNORMAL LOW (ref 39.0–52.0)
HCT: 27 % — ABNORMAL LOW (ref 39.0–52.0)
HCT: 28 % — ABNORMAL LOW (ref 39.0–52.0)
HCT: 30 % — ABNORMAL LOW (ref 39.0–52.0)
HCT: 28 % — ABNORMAL LOW (ref 39.0–52.0)
HCT: 30 % — ABNORMAL LOW (ref 39.0–52.0)
HCT: 28 % — ABNORMAL LOW (ref 39.0–52.0)
HCT: 25 % — ABNORMAL LOW (ref 39.0–52.0)
Hemoglobin: 8.8 g/dL — ABNORMAL LOW (ref 13.0–17.0)
Hemoglobin: 9.2 g/dL — ABNORMAL LOW (ref 13.0–17.0)
Hemoglobin: 9.5 g/dL — ABNORMAL LOW (ref 13.0–17.0)
Hemoglobin: 10.2 g/dL — ABNORMAL LOW (ref 13.0–17.0)
Hemoglobin: 9.5 g/dL — ABNORMAL LOW (ref 13.0–17.0)
Hemoglobin: 10.2 g/dL — ABNORMAL LOW (ref 13.0–17.0)
Hemoglobin: 9.5 g/dL — ABNORMAL LOW (ref 13.0–17.0)
Hemoglobin: 8.5 g/dL — ABNORMAL LOW (ref 13.0–17.0)
O2 Saturation: 96 %
O2 Saturation: 94 %
O2 Saturation: 97 %
O2 Saturation: 93 %
O2 Saturation: 97 %
O2 Saturation: 93 %
O2 Saturation: 96 %
O2 Saturation: 98 %
Patient temperature: 98.1
Patient temperature: 36.8
Patient temperature: 36.8
Patient temperature: 36.9
Patient temperature: 36.8
Patient temperature: 36.8
Patient temperature: 36.8
Patient temperature: 36.8
Potassium: 4.6 mmol/L (ref 3.5–5.1)
Potassium: 4.6 mmol/L (ref 3.5–5.1)
Potassium: 5 mmol/L (ref 3.5–5.1)
Potassium: 5 mmol/L (ref 3.5–5.1)
Potassium: 5.1 mmol/L (ref 3.5–5.1)
Potassium: 4.9 mmol/L (ref 3.5–5.1)
Potassium: 5 mmol/L (ref 3.5–5.1)
Potassium: 4.7 mmol/L (ref 3.5–5.1)
Sodium: 152 mmol/L — ABNORMAL HIGH (ref 135–145)
Sodium: 154 mmol/L — ABNORMAL HIGH (ref 135–145)
Sodium: 154 mmol/L — ABNORMAL HIGH (ref 135–145)
Sodium: 154 mmol/L — ABNORMAL HIGH (ref 135–145)
Sodium: 152 mmol/L — ABNORMAL HIGH (ref 135–145)
Sodium: 152 mmol/L — ABNORMAL HIGH (ref 135–145)
Sodium: 151 mmol/L — ABNORMAL HIGH (ref 135–145)
Sodium: 147 mmol/L — ABNORMAL HIGH (ref 135–145)
TCO2: 35 mmol/L — ABNORMAL HIGH (ref 22–32)
TCO2: 33 mmol/L — ABNORMAL HIGH (ref 22–32)
TCO2: 36 mmol/L — ABNORMAL HIGH (ref 22–32)
TCO2: 36 mmol/L — ABNORMAL HIGH (ref 22–32)
TCO2: 36 mmol/L — ABNORMAL HIGH (ref 22–32)
TCO2: 36 mmol/L — ABNORMAL HIGH (ref 22–32)
TCO2: 36 mmol/L — ABNORMAL HIGH (ref 22–32)
TCO2: 36 mmol/L — ABNORMAL HIGH (ref 22–32)
pCO2 arterial: 54.2 mmHg — ABNORMAL HIGH (ref 32.0–48.0)
pCO2 arterial: 48.3 mmHg — ABNORMAL HIGH (ref 32.0–48.0)
pCO2 arterial: 64.6 mmHg — ABNORMAL HIGH (ref 32.0–48.0)
pCO2 arterial: 70 mmHg (ref 32.0–48.0)
pCO2 arterial: 56.4 mmHg — ABNORMAL HIGH (ref 32.0–48.0)
pCO2 arterial: 63.4 mmHg — ABNORMAL HIGH (ref 32.0–48.0)
pCO2 arterial: 56.8 mmHg — ABNORMAL HIGH (ref 32.0–48.0)
pCO2 arterial: 48.2 mmHg — ABNORMAL HIGH (ref 32.0–48.0)
pH, Arterial: 7.399 (ref 7.350–7.450)
pH, Arterial: 7.327 — ABNORMAL LOW (ref 7.350–7.450)
pH, Arterial: 7.416 (ref 7.350–7.450)
pH, Arterial: 7.293 — ABNORMAL LOW (ref 7.350–7.450)
pH, Arterial: 7.387 (ref 7.350–7.450)
pH, Arterial: 7.335 — ABNORMAL LOW (ref 7.350–7.450)
pH, Arterial: 7.388 (ref 7.350–7.450)
pH, Arterial: 7.469 — ABNORMAL HIGH (ref 7.350–7.450)
pO2, Arterial: 84 mmHg (ref 83.0–108.0)
pO2, Arterial: 76 mmHg — ABNORMAL LOW (ref 83.0–108.0)
pO2, Arterial: 93 mmHg (ref 83.0–108.0)
pO2, Arterial: 78 mmHg — ABNORMAL LOW (ref 83.0–108.0)
pO2, Arterial: 89 mmHg (ref 83.0–108.0)
pO2, Arterial: 73 mmHg — ABNORMAL LOW (ref 83.0–108.0)
pO2, Arterial: 85 mmHg (ref 83.0–108.0)
pO2, Arterial: 101 mmHg (ref 83.0–108.0)

## 2019-09-29 LAB — POCT I-STAT EG7
Acid-Base Excess: 6 mmol/L — ABNORMAL HIGH (ref 0.0–2.0)
Bicarbonate: 33 mmol/L — ABNORMAL HIGH (ref 20.0–28.0)
Calcium, Ion: 1.28 mmol/L (ref 1.15–1.40)
HCT: 27 % — ABNORMAL LOW (ref 39.0–52.0)
Hemoglobin: 9.2 g/dL — ABNORMAL LOW (ref 13.0–17.0)
O2 Saturation: 80 %
Potassium: 5 mmol/L (ref 3.5–5.1)
Sodium: 152 mmol/L — ABNORMAL HIGH (ref 135–145)
TCO2: 35 mmol/L — ABNORMAL HIGH (ref 22–32)
pCO2, Ven: 59 mmHg (ref 44.0–60.0)
pH, Ven: 7.356 (ref 7.250–7.430)
pO2, Ven: 48 mmHg — ABNORMAL HIGH (ref 32.0–45.0)

## 2019-09-29 LAB — BASIC METABOLIC PANEL
Anion gap: 7 (ref 5–15)
Anion gap: 8 (ref 5–15)
BUN: 88 mg/dL — ABNORMAL HIGH (ref 6–20)
BUN: 97 mg/dL — ABNORMAL HIGH (ref 6–20)
CO2: 32 mmol/L (ref 22–32)
CO2: 32 mmol/L (ref 22–32)
Calcium: 9 mg/dL (ref 8.9–10.3)
Calcium: 9 mg/dL (ref 8.9–10.3)
Chloride: 113 mmol/L — ABNORMAL HIGH (ref 98–111)
Chloride: 111 mmol/L (ref 98–111)
Creatinine, Ser: 0.67 mg/dL (ref 0.61–1.24)
Creatinine, Ser: 0.95 mg/dL (ref 0.61–1.24)
GFR calc Af Amer: >60 mL/min (ref >60)
GFR calc Af Amer: >60 mL/min (ref >60)
GFR calc non Af Amer: >60 mL/min (ref >60)
GFR calc non Af Amer: >60 mL/min (ref >60)
Glucose, Bld: 156 mg/dL — ABNORMAL HIGH (ref 70–99)
Glucose, Bld: 138 mg/dL — ABNORMAL HIGH (ref 70–99)
Potassium: 4.7 mmol/L (ref 3.5–5.1)
Potassium: 4.8 mmol/L (ref 3.5–5.1)
Sodium: 152 mmol/L — ABNORMAL HIGH (ref 135–145)
Sodium: 151 mmol/L — ABNORMAL HIGH (ref 135–145)

## 2019-09-29 LAB — APTT
aPTT: 77 seconds — ABNORMAL HIGH (ref 24–36)
aPTT: 54 seconds — ABNORMAL HIGH (ref 24–36)

## 2019-09-29 LAB — HEPARIN LEVEL (UNFRACTIONATED)
Heparin Unfractionated: 0.53 IU/mL (ref 0.30–0.70)
Heparin Unfractionated: 0.4 IU/mL (ref 0.30–0.70)

## 2019-09-29 LAB — GLUCOSE, CAPILLARY
Glucose-Capillary: 143 mg/dL — ABNORMAL HIGH (ref 70–99)
Glucose-Capillary: 110 mg/dL — ABNORMAL HIGH (ref 70–99)
Glucose-Capillary: 125 mg/dL — ABNORMAL HIGH (ref 70–99)
Glucose-Capillary: 113 mg/dL — ABNORMAL HIGH (ref 70–99)
Glucose-Capillary: 176 mg/dL — ABNORMAL HIGH (ref 70–99)
Glucose-Capillary: 133 mg/dL — ABNORMAL HIGH (ref 70–99)

## 2019-09-29 LAB — MAGNESIUM: Magnesium: 2.3 mg/dL (ref 1.7–2.4)

## 2019-09-29 LAB — LACTATE DEHYDROGENASE: LDH: 392 U/L — ABNORMAL HIGH (ref 98–192)

## 2019-09-29 LAB — PHOSPHORUS: Phosphorus: 4.2 mg/dL (ref 2.5–4.6)

## 2019-09-29 LAB — COOXEMETRY PANEL
Carboxyhemoglobin: 3.1 % — ABNORMAL HIGH (ref 0.5–1.5)
Methemoglobin: 1.2 % (ref 0.0–1.5)
O2 Saturation: 85.1 %
Total hemoglobin: 8.6 g/dL — ABNORMAL LOW (ref 12.0–16.0)

## 2019-09-29 LAB — LACTIC ACID, PLASMA: Lactic Acid, Venous: 1.1 mmol/L (ref 0.5–1.9)

## 2019-09-29 LAB — FIBRINOGEN: Fibrinogen: 477 mg/dL — ABNORMAL HIGH (ref 210–475)

## 2019-09-29 MED ORDER — FENTANYL CITRATE (PF) 100 MCG/2ML IJ SOLN
INTRAMUSCULAR | Status: AC
  Administered 2019-09-29: 100 ug
  Filled 2019-09-29: qty 2

## 2019-09-29 MED ORDER — DEXTROSE 5 % IV SOLN
500.0000 mg | Freq: Once | INTRAVENOUS | Status: AC
  Administered 2019-09-29: 500 mg via INTRAVENOUS
  Filled 2019-09-29: qty 500

## 2019-09-29 MED ORDER — AMLODIPINE BESYLATE 5 MG PO TABS
5.0000 mg | ORAL_TABLET | Freq: Every day | ORAL | Status: DC
  Administered 2019-09-29: 5 mg via ORAL
  Filled 2019-09-29: qty 1

## 2019-09-29 MED ORDER — FUROSEMIDE 10 MG/ML IJ SOLN
80.0000 mg | Freq: Once | INTRAMUSCULAR | Status: AC
  Administered 2019-09-29: 15:00:00 80 mg via INTRAVENOUS
  Filled 2019-09-29: qty 8

## 2019-09-29 NOTE — Progress Notes (Signed)
Patient ID: [name], male   DOB: July 24, 1976, 44 y.o.   MRN: [id number]      Advanced Heart Failure Rounding Note   Subjective:    Remains on VV ecmo.  BAL growing > 100K serratia. On meropenum and vanc starting 2/15.   CVP 6-7, got Diuril yesterday with I/Os negative.     On vent at 30% TV 350 cc, did well with sweep trial yesterday.    CXR with diffuse infiltrates, unchanged.   Arouses on vent, follows commands per nurse.   ABG 7.4/54/84/96%    LDH 355 => 373 => 392  hgb 8.7  Sodium 152, BUN/cr 88/0.67.   ECMO parameters 3600 rpm Delta P 37 -83   Objective:   Weight Range:  Vital Signs:   Temp:  [98.1 F (36.7 C)-98.8 F (37.1 C)] 98.1 F (36.7 C) (02/21 0400) Pulse Rate:  [83-99] 94 (02/21 0700) Resp:  [17-34] 17 (02/21 0700) BP: (149-170)/(64-70) 170/68 (02/21 0440) SpO2:  [95 %-100 %] 99 % (02/21 0700) Arterial Line BP: (134-211)/(61-131) 158/72 (02/21 0700) FiO2 (%):  [30 %] 30 % (02/21 0735) Weight:  [133.3 kg] 133.3 kg (02/21 0500) Last BM Date: 09/26/19  Weight change: Filed Weights   09/27/19 0500 09/28/19 0700 09/29/19 0500  Weight: 129.4 kg 133 kg 133.3 kg    Intake/Output:   Intake/Output Summary (Last 24 hours) at 09/29/2019 0756 Last data filed at 09/29/2019 0700 Gross per 24 hour  Intake 3231.4 ml  Output 4625 ml  Net -1393.6 ml     Physical Exam: General: sedated on vent, will arouse.  Neck: Trach, JVP difficult, no thyromegaly or thyroid nodule.  Lungs: Decreased BS bilaterally. CV: Nondisplaced PMI.  Heart regular S1/S2, no S3/S4, no murmur.  1+ ankle edema.   Abdomen: Soft, nontender, no hepatosplenomegaly, no distention.  Skin: Intact without lesions or rashes.  Neurologic: sedated on vent Extremities: No clubbing or cyanosis.  HEENT: Normal.   Telemetry: Sinus 80-90s Personally reviewed   Labs: Basic Metabolic Panel: Recent Labs  Lab 09/25/19 0337 09/25/19 0739 09/26/19 0433 09/26/19 0547 09/27/19 0458  09/27/19 1227 09/27/19 1708 09/27/19 1719 09/28/19 0455 09/28/19 0512 09/28/19 1558 09/28/19 1626 09/28/19 2213 09/29/19 0431 09/29/19 0440  NA 147*   < > 151*   < > 153*   < > 149*   < > 153*   < > 154* 152* 153* 152* 152*  K 4.1   < > 4.2   < > 4.1   < > 4.2   < > 4.4   < > 4.8 4.8 4.7 4.7 4.6  CL 110   < > 114*   < > 112*  --  112*  --  114*  --   --  114*  --  113*  --   CO2 28   < > 28   < > 32  --  29  --  30  --   --  30  --  32  --   GLUCOSE 301*   < > 231*   < > 147*  --  162*  --  173*  --   --  156*  --  156*  --   BUN 108*   < > 97*   < > 89*  --  101*  --  92*  --   --  95*  --  88*  --   CREATININE 0.92   < > 0.78   < > 0.67  --  0.85  --  0.88  --   --  0.77  --  0.67  --   CALCIUM 9.2   < > 9.2   < > 9.2  --  9.0   < > 8.9  --   --  9.1  --  9.0  --   MG 2.3  --  2.2  --  2.5*  --   --   --  2.2  --   --   --   --  2.3  --   PHOS 3.6  --  3.3  --  3.4  --   --   --  3.5  --   --   --   --  4.2  --    < > = values in this interval not displayed.    Liver Function Tests: No results for input(s): AST, ALT, ALKPHOS, BILITOT, PROT, ALBUMIN in the last 168 hours. No results for input(s): LIPASE, AMYLASE in the last 168 hours. No results for input(s): AMMONIA in the last 168 hours.  CBC: Recent Labs  Lab 09/27/19 0458 09/27/19 1227 09/27/19 1708 09/27/19 1719 09/28/19 0455 09/28/19 0512 09/28/19 1558 09/28/19 1626 09/28/19 2213 09/29/19 0431 09/29/19 0440  WBC 11.0*  --  13.6*  --  12.0*  --   --  12.5*  --  11.8*  --   HGB 8.6*   < > 9.1*   < > 8.7*   < > 9.9* 8.7* 8.8* 8.7* 8.8*  HCT 30.0*   < > 31.9*   < > 30.5*   < > 29.0* 31.0* 26.0* 30.7* 26.0*  MCV 95.5  --  96.4  --  97.4  --   --  97.2  --  99.0  --   PLT 122*  --  145*  --  130*  --   --  130*  --  122*  --    < > = values in this interval not displayed.    Cardiac Enzymes: No results for input(s): CKTOTAL, CKMB, CKMBINDEX, TROPONINI in the last 168 hours.  BNP: BNP (last 3 results) Recent  Labs    07/22/19 1039  BNP 15.3    ProBNP (last 3 results) No results for input(s): PROBNP in the last 8760 hours.    Other results:  Imaging: DG CHEST PORT 1 VIEW  Result Date: 09/29/2019 CLINICAL DATA:  COVID-19 pneumonia, ECMO, acute respiratory failure with hypoxia EXAM: PORTABLE CHEST 1 VIEW COMPARISON:  Chest radiograph from one day prior. FINDINGS: Tracheostomy tube tip overlies the tracheal air column at the thoracic inlet. Right internal jugular ECMO catheter enters the IVC, with the tip not seen on this image. Right PICC enters the right mediastinum with tip obscured by ECHO catheter. Stable cardiomediastinal silhouette with top-normal heart size. No pneumothorax. No pleural effusion. Extensive patchy opacities in both lungs have not substantially changed. IMPRESSION: 1. Support structures as detailed.  No pneumothorax. 2. Extensive patchy opacities in both lungs have not substantially changed. Electronically Signed   By: Ilona Sorrel M.D.   On: 09/29/2019 05:54   DG CHEST PORT 1 VIEW  Result Date: 09/28/2019 CLINICAL DATA:  Cardio respiratory failure EXAM: PORTABLE CHEST 1 VIEW COMPARISON:  Yesterday FINDINGS: Cardiomegaly and vascular pedicle widening. Low volume chest with diffuse patchy infiltrate. Stable cannula related to ECMO. Tracheostomy tube in place. No visible effusion or pneumothorax. IMPRESSION: Stable hardware, inflation, and airspace disease. Electronically Signed   By: Monte Fantasia M.D.   On:  09/28/2019 06:42     Medications:     Scheduled Medications: . amiodarone  200 mg Oral Daily  . amLODipine  5 mg Oral Daily  . vitamin C  500 mg Per Tube Daily  . chlorhexidine gluconate (MEDLINE KIT)  15 mL Mouth Rinse BID  . Chlorhexidine Gluconate Cloth  6 each Topical Daily  . cloNIDine  0.1 mg Oral Q6H  . feeding supplement (OSMOLITE 1.5 CAL)  237 mL Per Tube 6 X Daily  . feeding supplement (PRO-STAT SUGAR FREE 64)  60 mL Per Tube QID  . free water  500 mL  Per Tube Q6H  . gabapentin  100 mg Per Tube Q12H  . HYDROmorphone  2 mg Per Tube Q6H  . insulin aspart  0-20 Units Subcutaneous Q4H  . insulin aspart  5 Units Subcutaneous Q4H  . insulin detemir  18 Units Subcutaneous BID  . ipratropium-albuterol  3 mL Nebulization Q4H  . mouth rinse  15 mL Mouth Rinse 10 times per day  . montelukast  10 mg Per Tube QHS  . nutrition supplement (JUVEN)  1 packet Per Tube BID BM  . pantoprazole sodium  40 mg Per Tube BID  . propranolol  40 mg Per Tube TID  . psyllium  1 packet Oral BID  . QUEtiapine  50 mg Per Tube QHS  . sodium chloride flush  10-40 mL Intracatheter Q12H  . sodium chloride flush  10-40 mL Intracatheter Q12H  . tobramycin (PF)  300 mg Nebulization BID  . white petrolatum   Topical BID    Infusions: . sodium chloride    . sodium chloride 10 mL/hr at 09/29/19 0700  . sodium chloride 10 mL/hr at 09/29/19 0700  . chlorothiazide (DIURIL) IV    . dexmedetomidine (PRECEDEX) IV infusion Stopped (09/23/19 1336)  . heparin 1,500 Units/hr (09/29/19 0700)  . meropenem (MERREM) IV Stopped (09/29/19 0535)  . vasopressin (PITRESSIN) infusion - *FOR SHOCK* Stopped (09/24/19 0917)    PRN Medications: Place/Maintain arterial line **AND** sodium chloride, sodium chloride, sodium chloride, acetaminophen (TYLENOL) oral liquid 160 mg/5 mL, acetaminophen, [COMPLETED] diazepam **FOLLOWED BY** diazepam, hydrALAZINE, [COMPLETED] HYDROmorphone **FOLLOWED BY** [COMPLETED] HYDROmorphone **FOLLOWED BY** [COMPLETED] HYDROmorphone **FOLLOWED BY** HYDROmorphone **FOLLOWED BY** [START ON 10/01/2019] HYDROmorphone, magic mouthwash, magnesium hydroxide, midazolam, ondansetron (ZOFRAN) IV, senna-docusate, sodium chloride, sodium chloride flush   Assessment/Plan:    1. Acute hypoxic/hypercarbic respiratory failure due to COVID PNA: Remained markedly hypoxic despite full support. Intubated 12/26. S/p bilateral CTs 12/26 for pneumothoraces.  TEE on 12/26 LVEF 70% RV ok.  VV ECMO begun 12/26. Tracheostomy 12/29.  COVID ARDS at this point. ECMO circuit changed 1/8 & 1/23. Crescent cannula placed 2/2 (femoral cannulas removed) - ECMO parameters stable, good flow.  - Developed PNA and sepsis on 2/15.  BAL with > 100K serratia. On meropenem and nebulized tobramycin, continue to 2/22.  - Off precedex. Continue to wean oral valium and dilaudid.  - 24 hour sweep trial today.  - D/w CCM would be ok with permissive hypercapnea if he can maintain pH. Have stopped diamox to faciltate metabolic compensation - I/Os even, stable renal function. Getting Diuril per CCM, net negative yesterday.  - LDH 355 => 373 => 392.  - Continue heparin gtt.  - Continue chest PT  2. Bilateral PTX: Due to barotrauma.  - Resolved. CTs out  3. COVID PNA: management of COVID infection per CCM. CXR with bilateral multifocal PNA progressing to likely ARDS.  - s/p 10 days of remdesivir -  12/16, 12/2 s/p tociluzimab - 12/17 s/p convalescent plasma - Decadron at 2m/daily completed 1/11  4. ID: Empiric coverage with vancomycin/cefepime initially for possible secondary bacterial PNA, course completed.   - treated with cefepime and vanc for + cx with staph epidimidis, MSSA and serratia (sensitivities ok). - developed recurrent PNA and sepsis 2/15 - Developed PNA and sepsis on 2/15.  BAL with > 100K serratia. On mero and nebulized tobramycin starting 2/17 plan to continue to 2/22.   5. Anemia: - hgb 10.5 -> 8.2 -> 7.5 -. 8.3 -> 9.5 -> 8.6 -> 8.7 -> 8.7  today. Transfuse as needed to keep Hgb> 8.0  7. FEN - now with G-tube.Continue TFs  8. Hypernatremia - NA lower at 152. - Continue free water 500 cc q6hr  9. AKI - Volume status looks good. AKI and azotemia improved with IVF and FW  10. DTIs - WOC following.   D/w with ECMO team (ECMO coordinator/specialists, CCM, TCTS and pharmD) at bedside on multidisciplinary rounds. Continue to await ARDS recovery. Details as above.   Length of  Stay: 645 CRITICAL CARE Performed by: DLoralie Champagne Total critical care time: 40 minutes  Critical care time was exclusive of separately billable procedures and treating other patients.  Critical care was necessary to treat or prevent imminent or life-threatening deterioration.  Critical care was time spent personally by me (independent of midlevel providers or residents) on the following activities: development of treatment plan with patient and/or surrogate as well as nursing, discussions with consultants, evaluation of patient's response to treatment, examination of patient, obtaining history from patient or surrogate, ordering and performing treatments and interventions, ordering and review of laboratory studies, ordering and review of radiographic studies, pulse oximetry and re-evaluation of patient's condition.    DLoralie ChampagneMD 09/29/2019, 7:56 AM  Advanced Heart Failure Team Pager [phone](M-F; 7a - 4p)  Please contact [facility] for night-coverage after hours (4p -7a ) and weekends on [email or web address]

## 2019-09-29 NOTE — Progress Notes (Signed)
ANTICOAGULATION CONSULT NOTE - Follow Up Consult  Pharmacy Consult for heparin Indication: ECMO  Labs: Recent Labs    09/28/19 0455 09/28/19 0512 09/28/19 1626 09/28/19 1626 09/28/19 2213 09/28/19 2213 09/29/19 0431 09/29/19 0440  HGB 8.7*   < > 8.7*   < > 8.8*   < > 8.7* 8.8*  HCT 30.5*   < > 31.0*   < > 26.0*  --  30.7* 26.0*  PLT 130*  --  130*  --   --   --  122*  --   APTT 91*  --  77*  --   --   --  77*  --   HEPARINUNFRC 0.53  --  0.48  --   --   --  0.53  --   CREATININE 0.88  --  0.77  --   --   --  0.67  --    < > = values in this interval not displayed.    Assessment: 44yo male remains supratherapeutic on heparin with higher level despite decreased rate; RN reports no signs of bleeding.  Goal of Therapy:  Heparin level 0.3-0.4 units/ml   Plan:  Will decrease heparin gtt to 1500 units/hr and check level with next lab draw.    Wynona Neat, PharmD, BCPS  09/29/2019,5:50 AM

## 2019-09-29 NOTE — Progress Notes (Signed)
ANTICOAGULATION CONSULT NOTE - Follow Up Consult  Pharmacy Consult for heparin Indication: ECMO  Labs: Recent Labs    09/28/19 1626 09/28/19 2213 09/29/19 0431 09/29/19 0440 09/29/19 1458 09/29/19 1458 09/29/19 1638 09/29/19 1639  HGB 8.7*   < > 8.7*   < > 9.5*   < > 8.5* 8.8*  HCT 31.0*   < > 30.7*   < > 28.0*  --  25.0* 31.0*  PLT 130*  --  122*  --   --   --   --  129*  APTT 77*  --  77*  --   --   --   --  54*  HEPARINUNFRC 0.48  --  0.53  --   --   --   --  0.40  CREATININE 0.77  --  0.67  --   --   --   --  0.95   < > = values in this interval not displayed.    Assessment: 44yo male on ECMO, heparin level within goal range.  No overt bleeding or complications noted.  Goal of Therapy:  Heparin level 0.3-0.4 units/ml   Plan:  Continue IV heparin at current rate. Heparin levels and CBC with routine ECMO labs.  [name], [facility] Clinical Pharmacist Phone [phone]  09/29/2019 6:40 PM

## 2019-09-29 NOTE — Progress Notes (Signed)
NAME:  [name], MRN:  [id number], DOB:  07/18/1976, LOS: 29 ADMISSION DATE:  07/22/2019, CONSULTATION DATE:  12/24 REFERRING MD:  Sloan Leiter, CHIEF COMPLAINT:  Dyspnea   Brief History   44 y/o male admitted 12/14 with COVID pneumonia leading to ARDS, pneumomediastinum.  Intubated 12/26, bilateral chest tubes placed.  Past Medical History  GERD HTN Asthma  Significant Hospital Events   12/14 admit with hypoxemic respiratory failure in setting of COVID-19 pneumonia 12/23 noted to have pneumomediastinum on chest x-ray 12/24PCCM consulted for evaluation 12/26 worsening hypoxemia, intubated 12/27 VV fem/fem ECMO cannulation 12/29 tracheostomy >> See bedside nursing event notes for full rundown of procedures after 12/27 1/5 epistaxis, nasal packing 1/30 improved cxr, improved lung compliance, TV 350s on 15PC  1/31 CXR increased infiltrates, +CFB     2/2 Converted to right IJ Crescent cannula. 2/3 PEG tube inserted at bedside 2/14 30 min sweep trial> paO2 105 (18 PEEP, 60% FiO2) pH 7.22, pCO2 87  Consults:  PCCM, CTS, CHF, Palliative  Procedures:  PICC 1/3 >> R radial a-line 1/20 >> R IJ ECMO cath 2/3 >> Tracheostomy 12/29 >>  Significant Diagnostic Tests:  CT chest 12/22>>extensive pneumomediastinum, multifocal patchy bilateral groundglass opacities CXR 1/25 >> improving bilateral infiltrates. ECMO cannulae in place. CXR 2/5 >> clearing bilateral infiltrates  Micro Data:  BCx2 12/14 >>negative  Sputum 12/15 >> OPF 12/28 blood > negative 12/29 > staph aureus 1/4 bronch:  1/2 acid fast smear bronch: negative 1/2 fungal cx bronch: negative1/12 BAL: serratia, staph epidermidisu 1/4 BAL > MSSA, Serratia 1/9 blood > negative 1/9 Resp culture > MSSA, serratia 1/11 serratia > negative 1/12 fungus culture > negative 1/12 BAL > serratia 1/18 BAL >> rare Serratia. 1/24 blood > negative 1/30 blood > negative 1/30 resp culture > serratia 2/1 BAL >> rare Serratia -  enterococcus 2/1 aspergillus Ag > negative 2/2 resp culture > serratia  2/2 blood > negative 2/5 urine > negative 2/9 resp: serratia 2/15 BAL serratia  Antimicrobials:  Received remdesivir, steroids, actemra and convalescent plasma  5 days cefepime 12/24-12/29 Cefepime 1/4->1/13 vanc 1/4> 1/11 Ceftriaxone 1/14 > 1/27 vanc x1 1/20 Cefazolin 1/29 > 2/2 cefuroxim 2/2 vanc 2/2 Ceftriaxone x1 2/4 Meropenem 2/15>> Tobramycin inhaled 2/15>>  Interim history/subjective:   No events. Tolerated sweep 2 hours yesterday. Bps remains a bit elevated.  Objective   Blood pressure (!) 170/68, pulse 94, temperature 98.1 F (36.7 C), temperature source Oral, resp. rate 17, height _0  (1.727 m), weight 133.3 kg, SpO2 99 %. CVP:  [6 mmHg-7 mmHg] 7 mmHg  Vent Mode: PCV FiO2 (%):  [30 %] 30 % Set Rate:  [15 bmp] 15 bmp PEEP:  [10 cmH20] 10 cmH20 Plateau Pressure:  [29 cmH20-35 cmH20] 33 cmH20   Intake/Output Summary (Last 24 hours) at 09/29/2019 0721 Last data filed at 09/29/2019 0700 Gross per 24 hour  Intake 3231.4 ml  Output 4625 ml  Net -1393.6 ml   Filed Weights   09/27/19 0500 09/28/19 0700 09/29/19 0500  Weight: 129.4 kg 133 kg 133.3 kg    Examination: GEN: ill appearing man on vent HEENT: RIJ  CV: RRR, ext warm PULM: Coarse bilaterally, shallow GI: Soft, PEG CDI EXT: Diffuse anasarca NEURO: Opens eyes to voice, follows commands intermittently PSYCH: RASS -1  SKIN: Pale  ABG 7.39/54/84 Sweep 7 LPM 1.0 PC rate 20, 18/10/0.4   Flow (LPM): 5.02   Resolved Hospital Problem list     Assessment & Plan:   ARDS due  to COVID 19, requiring mechanical ventilation and VV ECMO  - Improving, lingering issues with HCAP vs. Bronchitis and profound deconditioning. - 24h sweep trial today  Pneumothoraces-resolved. -Chest tubes removed  AKI with azotemia Positive cumulative fluid balance BUN is slowly improving Hypernatremia Severe third spacing -FW replacement -  attempting to maintain neg fluid balance  - another dose of diuril today  Blood tinged GI secretions > none on 2/15 -no evidence of active bleeding   Sedation needs - weaning as tolerated  - drop valium to PRN - nothing continuous ongoing  Hypertension - clonidine and propanolol  - add amlodipine  Serratia pneumonia> treated Stop date 2/22  Neuro muscular weakness Appreciate PT input.  For range of motion  Bilateral lower feet deep tissue injury Wound care following   Best practice:  Pain/Anxiety/Delirium protocol (if indicated): see above VAP protocol (if indicated): Bundle in place. Respiratory support goals: Lung protective ventilation strategy DVT prophylaxis: Heparin drip, goal 0.3-0.5 GI prophylaxis: PPI Nutrition: TF Fluid status goals:  Attempting to maintain negative fluid balance Urinary catheter: Guide hemodynamic management Glucose control: controlled with current regimen Mobility/therapy needs: Bedrest.  Daily labs: as per ECMO protocol. Code Status: Full code  Family Communication: Wife updated 2/13 Disposition: ICU  Labs   CBC: CBC Latest Ref Rng & Units 09/29/2019 09/29/2019 09/28/2019  WBC 4.0 - 10.5 K/uL - 11.8(H) -  Hemoglobin 13.0 - 17.0 g/dL 8.8(L) 8.7(L) 8.8(L)  Hematocrit 39.0 - 52.0 % 26.0(L) 30.7(L) 26.0(L)  Platelets 150 - 400 K/uL - 122(L) -     Basic Metabolic Panel: BMP Latest Ref Rng & Units 09/29/2019 09/29/2019 09/28/2019  Glucose 70 - 99 mg/dL - 156(H) -  BUN 6 - 20 mg/dL - 88(H) -  Creatinine 0.61 - 1.24 mg/dL - 0.67 -  Sodium 135 - 145 mmol/L 152(H) 152(H) 153(H)  Potassium 3.5 - 5.1 mmol/L 4.6 4.7 4.7  Chloride 98 - 111 mmol/L - 113(H) -  CO2 22 - 32 mmol/L - 32 -  Calcium 8.9 - 10.3 mg/dL - 9.0 -     GFR: Estimated Creatinine Clearance: 155.7 mL/min (by C-G formula based on SCr of 0.67 mg/dL). Recent Labs  Lab 09/26/19 0433 09/26/19 1645 09/27/19 0458 09/27/19 0459 09/27/19 1708 09/28/19 0455 09/28/19 1626  09/29/19 0431  WBC 13.3*   < >   < >  --  13.6* 12.0* 12.5* 11.8*  LATICACIDVEN 1.3  --   --  1.1  --  1.1  --  1.1   < > = values in this interval not displayed.    Liver Function Tests: No results for input(s): AST, ALT, ALKPHOS, BILITOT, PROT, ALBUMIN in the last 168 hours. No results for input(s): LIPASE, AMYLASE in the last 168 hours. No results for input(s): AMMONIA in the last 168 hours.  ABG    Component Value Date/Time   PHART 7.399 09/29/2019 0440   PCO2ART 54.2 (H) 09/29/2019 0440   PO2ART 84.0 09/29/2019 0440   HCO3 33.6 (H) 09/29/2019 0440   TCO2 35 (H) 09/29/2019 0440   ACIDBASEDEF 4.0 (H) 09/16/2019 2334   O2SAT 85.1 09/29/2019 0511     Coagulation Profile: No results for input(s): INR, PROTIME in the last 168 hours.  Cardiac Enzymes: No results for input(s): CKTOTAL, CKMB, CKMBINDEX, TROPONINI in the last 168 hours.  HbA1C: Hgb A1c MFr Bld  Date/Time Value Ref Range Status  09/23/2019 08:58 AM 5.5 4.8 - 5.6 % Final    Comment:    (NOTE) Pre diabetes:  5.7%-6.4% Diabetes:              >6.4% Glycemic control for   <7.0% adults with diabetes   07/24/2019 04:50 AM 6.7 (H) 4.8 - 5.6 % Final    Comment:    (NOTE) Pre diabetes:          5.7%-6.4% Diabetes:              >6.4% Glycemic control for   <7.0% adults with diabetes     CBG: Recent Labs  Lab 09/28/19 1139 09/28/19 1632 09/28/19 1921 09/28/19 2319 09/29/19 0303  GLUCAP 161* 154* 143* 110* 125*   This patient is critically ill with multiple organ system failure; which, requires frequent high complexity decision making, assessment, support, evaluation, and titration of therapies. This was completed through the application of advanced monitoring technologies and extensive interpretation of multiple databases. During this encounter critical care time was devoted to patient care services described in this note for 32 minutes.  Candee Furbish, MD [facility] 09/29/2019 7:21 AM

## 2019-09-29 NOTE — Progress Notes (Signed)
Pharmacy Antibiotic Note  [name] is a 44 y.o. male admitted on 07/22/2019 with pneumonia.  Pharmacy has been consulted for meropenem dosing.  Meropenem with more literature in ECMO than cefepime.  WBC today 11, afebrile overnight. Scr 0.7. No new events noted.  Plan: Meropenem 2 g IV every 8 hours Tobi nebs bid F/u LOT.   Height: _0  (172.7 cm) Weight: 293 lb 14 oz (133.3 kg) IBW/kg (Calculated) : 68.4  Temp (24hrs), Avg:98.5 F (36.9 C), Min:98.1 F (36.7 C), Max:98.8 F (37.1 C)  Recent Labs  Lab 09/25/19 0400 09/25/19 1630 09/26/19 0433 09/26/19 1645 09/27/19 0458 09/27/19 0459 09/27/19 1708 09/28/19 0455 09/28/19 1626 09/29/19 0431  WBC  --    < > 13.3*   < > 11.0*  --  13.6* 12.0* 12.5* 11.8*  CREATININE  --    < > 0.78   < > 0.67  --  0.85 0.88 0.77 0.67  LATICACIDVEN 1.4  --  1.3  --   --  1.1  --  1.1  --  1.1   < > = values in this interval not displayed.    Estimated Creatinine Clearance: 155.7 mL/min (by C-G formula based on SCr of 0.67 mg/dL).    No Known Allergies  Antimicrobials this admission: Remdesivir 12/14>>12/23 Actemra x1 12/16; 12/22 Plasma 12/17 Vanco 12/26 >>12/28, 1/4>>1/13, 2/15>>2/17 Cefepime 12/26 >>12/30, 1/4>>1/13 Meropenem 2/15>> Tobi 2/16>> Ctx 1/15>> (nose packing)1/28 restarted for 1 dose when changed foley 2/5 Ancef nasal packing 1/28>2/3  Dose adjustments this admission: N/A  Microbiology results: 12/14 BCx: ngF 12/14 sputum: Normal respiratory flora  12/25 MRSA PCR neg  12/28 trach: few MSSA 12/28 BCx: ngF 1/2 bronch: >100 k serratia marcescens (R cefazolin), >100 k staph epidermidis (S vanc, tetra) 1/4 BAL: MSSA, serratia marcescens (R cefazolin) 1/9 sputum: serratia/staph 1/9 bldx2 -ng 1/11 BAL: serratia 1/12 BAL: serratia 1/18 BAL: serratia - S cefepime 1/24 BCx x 2: ngF 1/30 Bcx: ngF 1/30 TA: mod serratia (R cefazolin and CTX) 2/1 BAL: 30k serratia (R- cefazolin), 10k enterococcus 2/2 Sputum:  few GNR, rare GPRs 2/2 Bcx x2: ngF 2/2 UCx: ngF 2/5 UCx: ngF 2/9 BAL: mod serratia, rare MSSA 2/15 Bcx x2: ng 2/15 BAL: >100k serratia - only R cefazolin  Thank you for allowing pharmacy to be a part of this patient's care. Erin Hearing PharmD., BCPS Clinical Pharmacist 09/29/2019 11:28 AM

## 2019-09-30 ENCOUNTER — Inpatient Hospital Stay (HOSPITAL_COMMUNITY): Payer: Managed Care, Other (non HMO)

## 2019-09-30 LAB — POCT I-STAT 7, (LYTES, BLD GAS, ICA,H+H)
Acid-Base Excess: 9 mmol/L — ABNORMAL HIGH (ref 0.0–2.0)
Acid-Base Excess: 8 mmol/L — ABNORMAL HIGH (ref 0.0–2.0)
Acid-Base Excess: 8 mmol/L — ABNORMAL HIGH (ref 0.0–2.0)
Acid-Base Excess: 7 mmol/L — ABNORMAL HIGH (ref 0.0–2.0)
Acid-Base Excess: 9 mmol/L — ABNORMAL HIGH (ref 0.0–2.0)
Acid-Base Excess: 9 mmol/L — ABNORMAL HIGH (ref 0.0–2.0)
Acid-Base Excess: 9 mmol/L — ABNORMAL HIGH (ref 0.0–2.0)
Acid-Base Excess: 9 mmol/L — ABNORMAL HIGH (ref 0.0–2.0)
Acid-Base Excess: 10 mmol/L — ABNORMAL HIGH (ref 0.0–2.0)
Bicarbonate: 34.1 mmol/L — ABNORMAL HIGH (ref 20.0–28.0)
Bicarbonate: 33.5 mmol/L — ABNORMAL HIGH (ref 20.0–28.0)
Bicarbonate: 34.4 mmol/L — ABNORMAL HIGH (ref 20.0–28.0)
Bicarbonate: 33.5 mmol/L — ABNORMAL HIGH (ref 20.0–28.0)
Bicarbonate: 35.1 mmol/L — ABNORMAL HIGH (ref 20.0–28.0)
Bicarbonate: 34.2 mmol/L — ABNORMAL HIGH (ref 20.0–28.0)
Bicarbonate: 34.4 mmol/L — ABNORMAL HIGH (ref 20.0–28.0)
Bicarbonate: 33.7 mmol/L — ABNORMAL HIGH (ref 20.0–28.0)
Bicarbonate: 34.8 mmol/L — ABNORMAL HIGH (ref 20.0–28.0)
Calcium, Ion: 1.24 mmol/L (ref 1.15–1.40)
Calcium, Ion: 1.25 mmol/L (ref 1.15–1.40)
Calcium, Ion: 1.35 mmol/L (ref 1.15–1.40)
Calcium, Ion: 1.26 mmol/L (ref 1.15–1.40)
Calcium, Ion: 1.27 mmol/L (ref 1.15–1.40)
Calcium, Ion: 1.27 mmol/L (ref 1.15–1.40)
Calcium, Ion: 1.24 mmol/L (ref 1.15–1.40)
Calcium, Ion: 1.24 mmol/L (ref 1.15–1.40)
Calcium, Ion: 1.26 mmol/L (ref 1.15–1.40)
HCT: 32 % — ABNORMAL LOW (ref 39.0–52.0)
HCT: 29 % — ABNORMAL LOW (ref 39.0–52.0)
HCT: 29 % — ABNORMAL LOW (ref 39.0–52.0)
HCT: 27 % — ABNORMAL LOW (ref 39.0–52.0)
HCT: 29 % — ABNORMAL LOW (ref 39.0–52.0)
HCT: 30 % — ABNORMAL LOW (ref 39.0–52.0)
HCT: 29 % — ABNORMAL LOW (ref 39.0–52.0)
HCT: 28 % — ABNORMAL LOW (ref 39.0–52.0)
HCT: 30 % — ABNORMAL LOW (ref 39.0–52.0)
Hemoglobin: 10.9 g/dL — ABNORMAL LOW (ref 13.0–17.0)
Hemoglobin: 9.9 g/dL — ABNORMAL LOW (ref 13.0–17.0)
Hemoglobin: 9.9 g/dL — ABNORMAL LOW (ref 13.0–17.0)
Hemoglobin: 9.2 g/dL — ABNORMAL LOW (ref 13.0–17.0)
Hemoglobin: 9.9 g/dL — ABNORMAL LOW (ref 13.0–17.0)
Hemoglobin: 10.2 g/dL — ABNORMAL LOW (ref 13.0–17.0)
Hemoglobin: 9.9 g/dL — ABNORMAL LOW (ref 13.0–17.0)
Hemoglobin: 9.5 g/dL — ABNORMAL LOW (ref 13.0–17.0)
Hemoglobin: 10.2 g/dL — ABNORMAL LOW (ref 13.0–17.0)
O2 Saturation: 98 %
O2 Saturation: 92 %
O2 Saturation: 96 %
O2 Saturation: 95 %
O2 Saturation: 95 %
O2 Saturation: 98 %
O2 Saturation: 93 %
O2 Saturation: 94 %
O2 Saturation: 97 %
Patient temperature: 36.7
Patient temperature: 37
Patient temperature: 37
Patient temperature: 98.9
Patient temperature: 37
Patient temperature: 37.1
Patient temperature: 37.1
Patient temperature: 99.8
Patient temperature: 99.4
Potassium: 4.6 mmol/L (ref 3.5–5.1)
Potassium: 4.7 mmol/L (ref 3.5–5.1)
Potassium: 4.9 mmol/L (ref 3.5–5.1)
Potassium: 4.7 mmol/L (ref 3.5–5.1)
Potassium: 4.6 mmol/L (ref 3.5–5.1)
Potassium: 4.8 mmol/L (ref 3.5–5.1)
Potassium: 4.7 mmol/L (ref 3.5–5.1)
Potassium: 4.6 mmol/L (ref 3.5–5.1)
Potassium: 4.6 mmol/L (ref 3.5–5.1)
Sodium: 149 mmol/L — ABNORMAL HIGH (ref 135–145)
Sodium: 148 mmol/L — ABNORMAL HIGH (ref 135–145)
Sodium: 148 mmol/L — ABNORMAL HIGH (ref 135–145)
Sodium: 148 mmol/L — ABNORMAL HIGH (ref 135–145)
Sodium: 147 mmol/L — ABNORMAL HIGH (ref 135–145)
Sodium: 149 mmol/L — ABNORMAL HIGH (ref 135–145)
Sodium: 148 mmol/L — ABNORMAL HIGH (ref 135–145)
Sodium: 148 mmol/L — ABNORMAL HIGH (ref 135–145)
Sodium: 149 mmol/L — ABNORMAL HIGH (ref 135–145)
TCO2: 36 mmol/L — ABNORMAL HIGH (ref 22–32)
TCO2: 35 mmol/L — ABNORMAL HIGH (ref 22–32)
TCO2: 36 mmol/L — ABNORMAL HIGH (ref 22–32)
TCO2: 35 mmol/L — ABNORMAL HIGH (ref 22–32)
TCO2: 37 mmol/L — ABNORMAL HIGH (ref 22–32)
TCO2: 36 mmol/L — ABNORMAL HIGH (ref 22–32)
TCO2: 36 mmol/L — ABNORMAL HIGH (ref 22–32)
TCO2: 35 mmol/L — ABNORMAL HIGH (ref 22–32)
TCO2: 36 mmol/L — ABNORMAL HIGH (ref 22–32)
pCO2 arterial: 47.5 mmHg (ref 32.0–48.0)
pCO2 arterial: 49.2 mmHg — ABNORMAL HIGH (ref 32.0–48.0)
pCO2 arterial: 56 mmHg — ABNORMAL HIGH (ref 32.0–48.0)
pCO2 arterial: 56.5 mmHg — ABNORMAL HIGH (ref 32.0–48.0)
pCO2 arterial: 58.9 mmHg — ABNORMAL HIGH (ref 32.0–48.0)
pCO2 arterial: 52.9 mmHg — ABNORMAL HIGH (ref 32.0–48.0)
pCO2 arterial: 50.1 mmHg — ABNORMAL HIGH (ref 32.0–48.0)
pCO2 arterial: 47.7 mmHg (ref 32.0–48.0)
pCO2 arterial: 49.6 mmHg — ABNORMAL HIGH (ref 32.0–48.0)
pH, Arterial: 7.462 — ABNORMAL HIGH (ref 7.350–7.450)
pH, Arterial: 7.396 (ref 7.350–7.450)
pH, Arterial: 7.44 (ref 7.350–7.450)
pH, Arterial: 7.382 (ref 7.350–7.450)
pH, Arterial: 7.383 (ref 7.350–7.450)
pH, Arterial: 7.419 (ref 7.350–7.450)
pH, Arterial: 7.445 (ref 7.350–7.450)
pH, Arterial: 7.46 — ABNORMAL HIGH (ref 7.350–7.450)
pH, Arterial: 7.456 — ABNORMAL HIGH (ref 7.350–7.450)
pO2, Arterial: 106 mmHg (ref 83.0–108.0)
pO2, Arterial: 66 mmHg — ABNORMAL LOW (ref 83.0–108.0)
pO2, Arterial: 80 mmHg — ABNORMAL LOW (ref 83.0–108.0)
pO2, Arterial: 78 mmHg — ABNORMAL LOW (ref 83.0–108.0)
pO2, Arterial: 81 mmHg — ABNORMAL LOW (ref 83.0–108.0)
pO2, Arterial: 102 mmHg (ref 83.0–108.0)
pO2, Arterial: 68 mmHg — ABNORMAL LOW (ref 83.0–108.0)
pO2, Arterial: 69 mmHg — ABNORMAL LOW (ref 83.0–108.0)
pO2, Arterial: 90 mmHg (ref 83.0–108.0)

## 2019-09-30 LAB — CBC
HCT: 29.4 % — ABNORMAL LOW (ref 39.0–52.0)
HCT: 31 % — ABNORMAL LOW (ref 39.0–52.0)
Hemoglobin: 8.4 g/dL — ABNORMAL LOW (ref 13.0–17.0)
Hemoglobin: 9 g/dL — ABNORMAL LOW (ref 13.0–17.0)
MCH: 27.5 pg (ref 26.0–34.0)
MCH: 27.8 pg (ref 26.0–34.0)
MCHC: 28.6 g/dL — ABNORMAL LOW (ref 30.0–36.0)
MCHC: 29 g/dL — ABNORMAL LOW (ref 30.0–36.0)
MCV: 96.4 fL (ref 80.0–100.0)
MCV: 95.7 fL (ref 80.0–100.0)
Platelets: 113 10*3/uL — ABNORMAL LOW (ref 150–400)
Platelets: 127 10*3/uL — ABNORMAL LOW (ref 150–400)
RBC: 3.05 MIL/uL — ABNORMAL LOW (ref 4.22–5.81)
RBC: 3.24 MIL/uL — ABNORMAL LOW (ref 4.22–5.81)
RDW: 17.1 % — ABNORMAL HIGH (ref 11.5–15.5)
RDW: 16.9 % — ABNORMAL HIGH (ref 11.5–15.5)
WBC: 11.5 10*3/uL — ABNORMAL HIGH (ref 4.0–10.5)
WBC: 15.7 10*3/uL — ABNORMAL HIGH (ref 4.0–10.5)
nRBC: 0 % (ref 0.0–0.2)
nRBC: 0.2 % (ref 0.0–0.2)

## 2019-09-30 LAB — COOXEMETRY PANEL
Carboxyhemoglobin: 3.2 % — ABNORMAL HIGH (ref 0.5–1.5)
Methemoglobin: 1.3 % (ref 0.0–1.5)
O2 Saturation: 85 %
Total hemoglobin: 8.3 g/dL — ABNORMAL LOW (ref 12.0–16.0)

## 2019-09-30 LAB — BASIC METABOLIC PANEL
Anion gap: 11 (ref 5–15)
Anion gap: 8 (ref 5–15)
BUN: 88 mg/dL — ABNORMAL HIGH (ref 6–20)
BUN: 96 mg/dL — ABNORMAL HIGH (ref 6–20)
CO2: 31 mmol/L (ref 22–32)
CO2: 31 mmol/L (ref 22–32)
Calcium: 8.8 mg/dL — ABNORMAL LOW (ref 8.9–10.3)
Calcium: 8.9 mg/dL (ref 8.9–10.3)
Chloride: 106 mmol/L (ref 98–111)
Chloride: 107 mmol/L (ref 98–111)
Creatinine, Ser: 0.78 mg/dL (ref 0.61–1.24)
Creatinine, Ser: 0.73 mg/dL (ref 0.61–1.24)
GFR calc Af Amer: >60 mL/min (ref >60)
GFR calc Af Amer: >60 mL/min (ref >60)
GFR calc non Af Amer: >60 mL/min (ref >60)
GFR calc non Af Amer: >60 mL/min (ref >60)
Glucose, Bld: 115 mg/dL — ABNORMAL HIGH (ref 70–99)
Glucose, Bld: 130 mg/dL — ABNORMAL HIGH (ref 70–99)
Potassium: 4.7 mmol/L (ref 3.5–5.1)
Potassium: 4.8 mmol/L (ref 3.5–5.1)
Sodium: 148 mmol/L — ABNORMAL HIGH (ref 135–145)
Sodium: 146 mmol/L — ABNORMAL HIGH (ref 135–145)

## 2019-09-30 LAB — HEPARIN LEVEL (UNFRACTIONATED)
Heparin Unfractionated: 0.35 IU/mL (ref 0.30–0.70)
Heparin Unfractionated: 0.35 IU/mL (ref 0.30–0.70)

## 2019-09-30 LAB — GLUCOSE, CAPILLARY
Glucose-Capillary: 86 mg/dL (ref 70–99)
Glucose-Capillary: 114 mg/dL — ABNORMAL HIGH (ref 70–99)
Glucose-Capillary: 116 mg/dL — ABNORMAL HIGH (ref 70–99)
Glucose-Capillary: 114 mg/dL — ABNORMAL HIGH (ref 70–99)
Glucose-Capillary: 127 mg/dL — ABNORMAL HIGH (ref 70–99)
Glucose-Capillary: 123 mg/dL — ABNORMAL HIGH (ref 70–99)

## 2019-09-30 LAB — APTT
aPTT: 60 seconds — ABNORMAL HIGH (ref 24–36)
aPTT: 56 seconds — ABNORMAL HIGH (ref 24–36)

## 2019-09-30 LAB — LACTIC ACID, PLASMA: Lactic Acid, Venous: 1.2 mmol/L (ref 0.5–1.9)

## 2019-09-30 LAB — LACTATE DEHYDROGENASE: LDH: 384 U/L — ABNORMAL HIGH (ref 98–192)

## 2019-09-30 LAB — FIBRINOGEN: Fibrinogen: 445 mg/dL (ref 210–475)

## 2019-09-30 LAB — MAGNESIUM: Magnesium: 2.2 mg/dL (ref 1.7–2.4)

## 2019-09-30 LAB — PHOSPHORUS: Phosphorus: 4.3 mg/dL (ref 2.5–4.6)

## 2019-09-30 MED ORDER — HYDROMORPHONE HCL 2 MG PO TABS
2.0000 mg | ORAL_TABLET | Freq: Four times a day (QID) | ORAL | Status: DC | PRN
  Administered 2019-10-02 – 2019-10-04 (×6): 2 mg via ORAL
  Filled 2019-09-30 (×6): qty 1

## 2019-09-30 MED ORDER — PROPRANOLOL HCL 20 MG/5ML PO SOLN
80.0000 mg | Freq: Three times a day (TID) | ORAL | Status: DC
  Administered 2019-09-30 – 2019-10-12 (×37): 80 mg
  Filled 2019-09-30 (×39): qty 20

## 2019-09-30 MED ORDER — FUROSEMIDE 10 MG/ML IJ SOLN
40.0000 mg | Freq: Once | INTRAMUSCULAR | Status: AC
  Administered 2019-09-30: 40 mg via INTRAVENOUS

## 2019-09-30 MED ORDER — HYDRALAZINE HCL 25 MG PO TABS: 25.0000 mg | ORAL_TABLET | Freq: Three times a day (TID) | ORAL | Status: DC

## 2019-09-30 MED ORDER — AMLODIPINE BESYLATE 10 MG PO TABS
10.0000 mg | ORAL_TABLET | Freq: Every day | ORAL | Status: DC
  Administered 2019-09-30 – 2019-10-05 (×6): 10 mg via ORAL
  Filled 2019-09-30 (×6): qty 1

## 2019-09-30 MED ORDER — SODIUM CHLORIDE 0.9 % IV SOLN: 2.0000 g | Freq: Three times a day (TID) | INTRAVENOUS | Status: DC

## 2019-09-30 MED ORDER — HYDRALAZINE HCL 10 MG PO TABS
10.0000 mg | ORAL_TABLET | Freq: Three times a day (TID) | ORAL | Status: DC
  Administered 2019-09-30 – 2019-10-01 (×4): 10 mg via ORAL
  Filled 2019-09-30 (×3): qty 1

## 2019-09-30 NOTE — Progress Notes (Signed)
NAME:  [name], MRN:  [id number], DOB:  06-Sep-1975, LOS: 44 ADMISSION DATE:  07/22/2019, CONSULTATION DATE:  12/24 REFERRING MD:  Sloan Leiter, CHIEF COMPLAINT:  Dyspnea   Brief History   44 y/o male admitted 12/14 with COVID pneumonia leading to ARDS, pneumomediastinum.  Intubated 12/26, bilateral chest tubes placed.  Past Medical History  GERD HTN Asthma  Significant Hospital Events   12/14 admit with hypoxemic respiratory failure in setting of COVID-19 pneumonia 12/23 noted to have pneumomediastinum on chest x-ray 12/24PCCM consulted for evaluation 12/26 worsening hypoxemia, intubated 12/27 VV fem/fem ECMO cannulation 12/29 tracheostomy >> See bedside nursing event notes for full rundown of procedures after 12/27 1/5 epistaxis, nasal packing 1/30 improved cxr, improved lung compliance, TV 350s on 15PC  1/31 CXR increased infiltrates, +CFB     2/2 Converted to right IJ Crescent cannula. 2/3 PEG tube inserted at bedside 2/14 30 min sweep trial> paO2 105 (18 PEEP, 60% FiO2) pH 7.22, pCO2 87 2/20 2 hours sweep trial 2/21 1.5 hour sweep trial  Consults:  PCCM, CTS, CHF, Palliative  Procedures:  PICC 1/3 >> R radial a-line 1/20 >> R IJ ECMO cath 2/3 >> Tracheostomy 12/29 >>  Significant Diagnostic Tests:  CT chest 12/22>>extensive pneumomediastinum, multifocal patchy bilateral groundglass opacities CXR 1/25 >> improving bilateral infiltrates. ECMO cannulae in place. CXR 2/5 >> clearing bilateral infiltrates  Micro Data:  BCx2 12/14 >>negative  Sputum 12/15 >> OPF 12/28 blood > negative 12/29 > staph aureus 1/4 bronch:  1/2 acid fast smear bronch: negative 1/2 fungal cx bronch: negative1/12 BAL: serratia, staph epidermidisu 1/4 BAL > MSSA, Serratia 1/9 blood > negative 1/9 Resp culture > MSSA, serratia 1/11 serratia > negative 1/12 fungus culture > negative 1/12 BAL > serratia 1/18 BAL >> rare Serratia. 1/24 blood > negative 1/30 blood > negative 1/30  resp culture > serratia 2/1 BAL >> rare Serratia - enterococcus 2/1 aspergillus Ag > negative 2/2 resp culture > serratia  2/2 blood > negative 2/5 urine > negative 2/9 resp: serratia 2/15 BAL serratia  Antimicrobials:  Received remdesivir, steroids, actemra and convalescent plasma  5 days cefepime 12/24-12/29 Cefepime 1/4->1/13 vanc 1/4> 1/11 Ceftriaxone 1/14 > 1/27 vanc x1 1/20 Cefazolin 1/29 > 2/2 cefuroxim 2/2 vanc 2/2 Ceftriaxone x1 2/4 Meropenem 2/15>> Tobramycin inhaled 2/15>>  Interim history/subjective:  Diuresed 6L yesterday.   Objective   Blood pressure (!) 161/68, pulse 97, temperature 98.2 F (36.8 C), temperature source Oral, resp. rate (!) 25, height _0  (1.727 m), weight 132.6 kg, SpO2 99 %. CVP:  [5 mmHg-10 mmHg] 6 mmHg  Vent Mode: PCV FiO2 (%):  [30 %] 30 % Set Rate:  [20 bmp] 20 bmp PEEP:  [10 cmH20] 10 cmH20 Plateau Pressure:  [27 cmH20-29 cmH20] 28 cmH20   Intake/Output Summary (Last 24 hours) at 09/30/2019 7858 Last data filed at 09/30/2019 0700 Gross per 24 hour  Intake 4339.59 ml  Output 6330 ml  Net -1990.41 ml   Filed Weights   09/28/19 0700 09/29/19 0500 09/30/19 0500  Weight: 133 kg 133.3 kg 132.6 kg    Examination: GEN: ill appearing man on vent HEENT: RIJ  CV: RRR, ext warm PULM: Coarse bilaterally, shallow GI: Soft, PEG CDI EXT: Anasarca improved NEURO: Opens eyes to voice, follows commands intermittently PSYCH: RASS -1  SKIN: Pale  Gas/sweep/vent look okay  Resolved Hospital Problem list     Assessment & Plan:   ARDS due to COVID 19, requiring mechanical ventilation and VV ECMO  -  Improving, lingering issues with HCAP vs. Bronchitis and profound deconditioning. - 24h sweep trial today  Pneumothoraces-resolved. -Chest tubes removed  AKI with azotemia Positive cumulative fluid balance BUN is slowly improving Hypernatremia Severe third spacing -FW replacement - attempting to maintain neg fluid balance  -  will discuss diuresis again today  Blood tinged GI secretions > none on 2/15 -no evidence of active bleeding   Sedation needs - weaning as tolerated  - drop valium to PRN - nothing continuous ongoing  Hypertension - clonidine, amlodipine and propanolol  - add hydralazine  Serratia pneumonia> treated Stop date 2/22  Neuro muscular weakness Appreciate PT input.  For range of motion  Bilateral lower feet deep tissue injury Wound care following   Best practice:  Pain/Anxiety/Delirium protocol (if indicated): see above VAP protocol (if indicated): Bundle in place. Respiratory support goals: Lung protective ventilation strategy DVT prophylaxis: Heparin drip, goal 0.3-0.5 GI prophylaxis: PPI Nutrition: TF Fluid status goals:  Attempting to maintain negative fluid balance Urinary catheter: Guide hemodynamic management Glucose control: controlled with current regimen Mobility/therapy needs: Bedrest.  Daily labs: as per ECMO protocol. Code Status: Full code  Family Communication: Wife updated 2/13 Disposition: ICU  Labs   CBC: CBC Latest Ref Rng & Units 09/30/2019 09/30/2019 09/29/2019  WBC 4.0 - 10.5 K/uL - 11.5(H) 14.5(H)  Hemoglobin 13.0 - 17.0 g/dL 10.9(L) 8.4(L) 8.8(L)  Hematocrit 39.0 - 52.0 % 32.0(L) 29.4(L) 31.0(L)  Platelets 150 - 400 K/uL - 113(L) 129(L)     Basic Metabolic Panel: BMP Latest Ref Rng & Units 09/30/2019 09/30/2019 09/29/2019  Glucose 70 - 99 mg/dL - 115(H) 138(H)  BUN 6 - 20 mg/dL - 88(H) 97(H)  Creatinine 0.61 - 1.24 mg/dL - 0.78 0.95  Sodium 135 - 145 mmol/L 149(H) 148(H) 151(H)  Potassium 3.5 - 5.1 mmol/L 4.6 4.7 4.8  Chloride 98 - 111 mmol/L - 106 111  CO2 22 - 32 mmol/L - 31 32  Calcium 8.9 - 10.3 mg/dL - 8.8(L) 9.0     GFR: Estimated Creatinine Clearance: 155.2 mL/min (by C-G formula based on SCr of 0.78 mg/dL). Recent Labs  Lab 09/27/19 0459 09/27/19 1708 09/28/19 0455 09/28/19 0455 09/28/19 1626 09/29/19 0431 09/29/19 1639  09/30/19 0500  WBC  --    < > 12.0*   < > 12.5* 11.8* 14.5* 11.5*  LATICACIDVEN 1.1  --  1.1  --   --  1.1  --  1.2   < > = values in this interval not displayed.    Liver Function Tests: No results for input(s): AST, ALT, ALKPHOS, BILITOT, PROT, ALBUMIN in the last 168 hours. No results for input(s): LIPASE, AMYLASE in the last 168 hours. No results for input(s): AMMONIA in the last 168 hours.  ABG    Component Value Date/Time   PHART 7.462 (H) 09/30/2019 0508   PCO2ART 47.5 09/30/2019 0508   PO2ART 106.0 09/30/2019 0508   HCO3 34.1 (H) 09/30/2019 0508   TCO2 36 (H) 09/30/2019 0508   ACIDBASEDEF 4.0 (H) 09/16/2019 2334   O2SAT 98.0 09/30/2019 0508     Coagulation Profile: No results for input(s): INR, PROTIME in the last 168 hours.  Cardiac Enzymes: No results for input(s): CKTOTAL, CKMB, CKMBINDEX, TROPONINI in the last 168 hours.  HbA1C: Hgb A1c MFr Bld  Date/Time Value Ref Range Status  09/23/2019 08:58 AM 5.5 4.8 - 5.6 % Final    Comment:    (NOTE) Pre diabetes:          5.7%-6.4%  Diabetes:              >6.4% Glycemic control for   <7.0% adults with diabetes   07/24/2019 04:50 AM 6.7 (H) 4.8 - 5.6 % Final    Comment:    (NOTE) Pre diabetes:          5.7%-6.4% Diabetes:              >6.4% Glycemic control for   <7.0% adults with diabetes     CBG: Recent Labs  Lab 09/29/19 1146 09/29/19 1637 09/29/19 1941 09/30/19 0147 09/30/19 0504  GLUCAP 176* 133* 86 114* 116*   This patient is critically ill with multiple organ system failure; which, requires frequent high complexity decision making, assessment, support, evaluation, and titration of therapies. This was completed through the application of advanced monitoring technologies and extensive interpretation of multiple databases. During this encounter critical care time was devoted to patient care services described in this note for 38 minutes.  Candee Furbish, MD [facility] 09/30/2019 7:28 AM

## 2019-09-30 NOTE — Progress Notes (Signed)
Sweep trial started at 0900; vent settings maxamized per Dr. Calton Dach. ABGs drawn per Dr. Pernell Dupre (39mn, 370m 1hr).  Patient began to drop Tv from 500 to 300.  ABG drawn, sweep tiral ended at 12027mand patient returned to previous settings.  Results for GOUAIDIN, DOANERN [id number] of 09/30/2019 11:15  Ref. Range 09/30/2019 09:05 09/30/2019 09:25 09/30/2019 09:54 09/30/2019 10:45  Sample type Unknown CARDIOPULMONARY BYPASS CARDIOPULMONARY BYPASS ARTERIAL CARDIOPULMONARY BYPASS  pH, Arterial Latest Ref Range: 7.350 - 7.450  7.440 7.396 7.382 7.383  pCO2 arterial Latest Ref Range: 32.0 - 48.0 mmHg 49.2 (H) 56.0 (H) 56.5 (H) 58.9 (H)  pO2, Arterial Latest Ref Range: 83.0 - 108.0 mmHg 80.0 (L) 66.0 (L) 78.0 (L) 81.0 (L)  TCO2 Latest Ref Range: 22 - 32 mmol/L 35 (H) 36 (H) 35 (H) 37 (H)  Acid-Base Excess Latest Ref Range: 0.0 - 2.0 mmol/L 8.0 (H) 8.0 (H) 7.0 (H) 9.0 (H)  Bicarbonate Latest Ref Range: 20.0 - 28.0 mmol/L 33.5 (H) 34.4 (H) 33.5 (H) 35.1 (H)  O2 Saturation Latest Units: % 96.0 92.0 95.0 95.0  Patient temperature Unknown 37.0 C 37.0 C 98.9 F 37.0 C  Collection site Unknown ARTERIAL LINE ARTERIAL LINE ARTERIAL LINE ARTERIAL LINE

## 2019-09-30 NOTE — Progress Notes (Signed)
Patient ID: [name], male   DOB: January 21, 1976, 44 y.o.   MRN: [id number]    Advanced Heart Failure Rounding Note   Subjective:    Remains on VV ecmo.  BAL growing > 100K serratia. On meropenum and vanc  CVP 6-7  Sweep 8 -> 3.5  On vent at 30% TVs > 400-450cc  CXR with diffuse infiltrates. Unchanged today. Crescent catheter well positioned Personally reviewed  Will arouse on vent but not reliably follow commands for me.  ABG 7.46/47/106/98%    Co-ox 85%  LDH 384  hgb 8.4  Sodium 148  BUN 138 -> 127 -> 122-> 108 -> 97 -> 89 -> 88 Cr 0.67 -> 0.78  ECMO parameters - see separate ecmo note   Objective:   Weight Range:  Vital Signs:   Temp:  [98.2 F (36.8 C)-98.4 F (36.9 C)] 98.2 F (36.8 C) (02/22 0400) Pulse Rate:  [83-104] 94 (02/22 0800) Resp:  [17-33] 24 (02/22 0800) BP: (161-171)/(65-73) 162/73 (02/22 0802) SpO2:  [94 %-100 %] 100 % (02/22 0800) Arterial Line BP: (140-285)/(58-274) 159/73 (02/22 0800) FiO2 (%):  [30 %] 30 % (02/22 0800) Weight:  [132.6 kg] 132.6 kg (02/22 0500) Last BM Date: 09/29/19  Weight change: Filed Weights   09/28/19 0700 09/29/19 0500 09/30/19 0500  Weight: 133 kg 133.3 kg 132.6 kg    Intake/Output:   Intake/Output Summary (Last 24 hours) at 09/30/2019 0836 Last data filed at 09/30/2019 0800 Gross per 24 hour  Intake 4674.06 ml  Output 6355 ml  Net -1680.94 ml     Physical Exam: General:  Intubated sedated HEENT: normal Neck: supple. RIJ cannula. Carotids 2+ bilat; no bruits. No lymphadenopathy or thryomegaly appreciated. Cor: PMI nondisplaced. Regular rate & rhythm. No rubs, gallops or murmurs. Lungs: clear Abdomen: obese soft, nontender, nondistended. No hepatosplenomegaly. No bruits or masses. Good bowel sounds. Extremities: no cyanosis, clubbing, rash, tr edema + DTis Neuro: intubated/sedated   Telemetry: Sinus 90s Personally reviewed   Labs: Basic Metabolic Panel: Recent Labs  Lab 09/26/19 0433  09/26/19 0547 09/27/19 0458 09/27/19 1227 09/28/19 0455 09/28/19 0512 09/28/19 1626 09/28/19 2213 09/29/19 0431 09/29/19 0440 09/29/19 1458 09/29/19 1638 09/29/19 1639 09/30/19 0500 09/30/19 0508  NA 151*   < > 153*   < > 153*   < > 152*   < > 152*   < > 151* 147* 151* 148* 149*  K 4.2   < > 4.1   < > 4.4   < > 4.8   < > 4.7   < > 5.0 4.7 4.8 4.7 4.6  CL 114*   < > 112*   < > 114*  --  114*  --  113*  --   --   --  111 106  --   CO2 28   < > 32   < > 30  --  30  --  32  --   --   --  32 31  --   GLUCOSE 231*   < > 147*   < > 173*  --  156*  --  156*  --   --   --  138* 115*  --   BUN 97*   < > 89*   < > 92*  --  95*  --  88*  --   --   --  97* 88*  --   CREATININE 0.78   < > 0.67   < > 0.88  --  0.77  --  0.67  --   --   --  0.95 0.78  --   CALCIUM 9.2   < > 9.2   < > 8.9   < > 9.1  --  9.0  --   --   --  9.0 8.8*  --   MG 2.2  --  2.5*  --  2.2  --   --   --  2.3  --   --   --   --  2.2  --   PHOS 3.3  --  3.4  --  3.5  --   --   --  4.2  --   --   --   --  4.3  --    < > = values in this interval not displayed.    Liver Function Tests: No results for input(s): AST, ALT, ALKPHOS, BILITOT, PROT, ALBUMIN in the last 168 hours. No results for input(s): LIPASE, AMYLASE in the last 168 hours. No results for input(s): AMMONIA in the last 168 hours.  CBC: Recent Labs  Lab 09/28/19 0455 09/28/19 0512 09/28/19 1626 09/28/19 2213 09/29/19 0431 09/29/19 0440 09/29/19 1458 09/29/19 1638 09/29/19 1639 09/30/19 0500 09/30/19 0508  WBC 12.0*  --  12.5*  --  11.8*  --   --   --  14.5* 11.5*  --   HGB 8.7*   < > 8.7*   < > 8.7*   < > 9.5* 8.5* 8.8* 8.4* 10.9*  HCT 30.5*   < > 31.0*   < > 30.7*   < > 28.0* 25.0* 31.0* 29.4* 32.0*  MCV 97.4  --  97.2  --  99.0  --   --   --  98.1 96.4  --   PLT 130*  --  130*  --  122*  --   --   --  129* 113*  --    < > = values in this interval not displayed.    Cardiac Enzymes: No results for input(s): CKTOTAL, CKMB, CKMBINDEX, TROPONINI in  the last 168 hours.  BNP: BNP (last 3 results) Recent Labs    07/22/19 1039  BNP 15.3    ProBNP (last 3 results) No results for input(s): PROBNP in the last 8760 hours.    Other results:  Imaging: DG CHEST PORT 1 VIEW  Result Date: 09/30/2019 CLINICAL DATA:  COVID pneumonia, ECMO. EXAM: PORTABLE CHEST 1 VIEW COMPARISON:  Multiple previous chest films. FINDINGS: The tracheostomy tube is stable. Persistent patchy coarse bilateral infiltrates but slight improved aeration when compared to the 2 most previous chest films. No pleural effusions. No pneumothorax. IMPRESSION: Persistent bilateral coarse infiltrates but slight improved lung aeration. Electronically Signed   By: Marijo Sanes M.D.   On: 09/30/2019 08:00   DG CHEST PORT 1 VIEW  Result Date: 09/29/2019 CLINICAL DATA:  COVID-19 pneumonia, ECMO, acute respiratory failure with hypoxia EXAM: PORTABLE CHEST 1 VIEW COMPARISON:  Chest radiograph from one day prior. FINDINGS: Tracheostomy tube tip overlies the tracheal air column at the thoracic inlet. Right internal jugular ECMO catheter enters the IVC, with the tip not seen on this image. Right PICC enters the right mediastinum with tip obscured by ECHO catheter. Stable cardiomediastinal silhouette with top-normal heart size. No pneumothorax. No pleural effusion. Extensive patchy opacities in both lungs have not substantially changed. IMPRESSION: 1. Support structures as detailed.  No pneumothorax. 2. Extensive patchy opacities in both lungs have not substantially changed. Electronically Signed  By: Ilona Sorrel M.D.   On: 09/29/2019 05:54     Medications:     Scheduled Medications: . amiodarone  200 mg Oral Daily  . amLODipine  10 mg Oral Daily  . vitamin C  500 mg Per Tube Daily  . chlorhexidine gluconate (MEDLINE KIT)  15 mL Mouth Rinse BID  . Chlorhexidine Gluconate Cloth  6 each Topical Daily  . cloNIDine  0.1 mg Oral Q6H  . feeding supplement (OSMOLITE 1.5 CAL)  237 mL Per  Tube 6 X Daily  . feeding supplement (PRO-STAT SUGAR FREE 64)  60 mL Per Tube QID  . free water  500 mL Per Tube Q6H  . hydrALAZINE  10 mg Oral Q8H  . insulin aspart  0-20 Units Subcutaneous Q4H  . insulin aspart  5 Units Subcutaneous Q4H  . insulin detemir  18 Units Subcutaneous BID  . ipratropium-albuterol  3 mL Nebulization Q4H  . mouth rinse  15 mL Mouth Rinse 10 times per day  . montelukast  10 mg Per Tube QHS  . nutrition supplement (JUVEN)  1 packet Per Tube BID BM  . pantoprazole sodium  40 mg Per Tube BID  . propranolol  40 mg Per Tube TID  . psyllium  1 packet Oral BID  . sodium chloride flush  10-40 mL Intracatheter Q12H  . sodium chloride flush  10-40 mL Intracatheter Q12H  . tobramycin (PF)  300 mg Nebulization BID  . white petrolatum   Topical BID    Infusions: . sodium chloride    . sodium chloride Stopped (09/30/19 0734)  . sodium chloride 10 mL/hr at 09/30/19 0800  . dexmedetomidine (PRECEDEX) IV infusion Stopped (09/23/19 1336)  . heparin 1,500 Units/hr (09/30/19 0800)  . meropenem (MERREM) IV Stopped (09/30/19 0550)    PRN Medications: Place/Maintain arterial line **AND** sodium chloride, sodium chloride, sodium chloride, acetaminophen (TYLENOL) oral liquid 160 mg/5 mL, acetaminophen, [COMPLETED] diazepam **FOLLOWED BY** diazepam, hydrALAZINE, HYDROmorphone, magic mouthwash, magnesium hydroxide, midazolam, ondansetron (ZOFRAN) IV, senna-docusate, sodium chloride, sodium chloride flush   Assessment/Plan:    1. Acute hypoxic/hypercarbic respiratory failure due to COVID PNA: Remained markedly hypoxic despite full support. Intubated 12/26. S/p bilateral CTs 12/26 for pneumothoraces.  TEE on 12/26 LVEF 70% RV ok. VV ECMO begun 12/26. Tracheostomy 12/29.  COVID ARDS at this point. ECMO circuit changed 1/8 & 1/23. Crescent cannula placed 2/2 (femoral cannuals removed) - ECMO parameters as per ECMO note - Flows look good on Crescent - Developed PNA and sepsis on  2/15.  BAL with > 100K serratia. On mero and nebulized tobra. Today day 7/10 of mero - Off precedex and valium. Will stop dilaudid and Seroquel today. - Wil watch mental status and BUN. If not waking up by midweek can re-discuss brief HD with Renal to clean - Continue to daily sweep trials and autowean of sweep  - Volume status ok. Can give lasix as needed - LDH 384 - Continue heparin. Discussed dosing with PharmD personally. - Continue chest PT  2. Bilateral PTX: Due to barotrauma.  - Resolved. CTs out  3. COVID PNA: management of COVID infection per CCM. CXR with bilateral multifocal PNA progressing to likely ARDS.  - s/p 10 days of remdesivir - 12/16, 12/2 s/p tociluzimab - 12/17 s/p convalescent plasma - Decadron at 103m/daily completed 1/11 - Completed cefepime/vanc - Off Ancef  4. ID: Empiric coverage with vancomycin/cefepime initially for possible secondary bacterial PNA, course completed.   - treated with cefepime and vanc for +  cx with staph epidimidis, MSSA and serratia (sensitivities ok). - developed recurrent PNA and sepsis 2/15 - BAL with > 100K serratia. On mero and vanc - Developed PNA and sepsis on 2/15.  BAL with > 100K serratia. On mero and vanc and nebulized tobra. Can likely drop vanc  5. Anemia: - hgb 10.5 -> 8.2 -> 7.5 -. 8.3 -> 9.5 -> 8.6  today. Transfuse as needed to keep Hgb> 8.0  7. FEN - now with G-tube.Continue TFs  8. Hypernatremia - NA 155 -> 143 -> 145 -> 142 -> 146 -> 150 -> 152 -> 148-> 147 -> 152 -> 153 -> 149 - Continue FW. Can add IVF as needed  9. AKI - Volume status looks good. AKI and azotemia improved with IVF and FW  10. DTIs - WOC following.   D/w with ECMO team (ECMO coordinator/specialists, CCM, TCTS and pharmD) at bedside on multidisciplinary rounds. Wean sedation. Continue to wean sweep. Details as above.   Length of Stay: 2  CRITICAL CARE Performed by: Glori Bickers  Total critical care time: 35 minutes  Critical  care time was exclusive of separately billable procedures and treating other patients.  Critical care was necessary to treat or prevent imminent or life-threatening deterioration.  Critical care was time spent personally by me (independent of midlevel providers or residents) on the following activities: development of treatment plan with patient and/or surrogate as well as nursing, discussions with consultants, evaluation of patient's response to treatment, examination of patient, obtaining history from patient or surrogate, ordering and performing treatments and interventions, ordering and review of laboratory studies, ordering and review of radiographic studies, pulse oximetry and re-evaluation of patient's condition.    Glori Bickers MD 09/30/2019, 8:36 AM  Advanced Heart Failure Team Pager [phone] (M-F; [place])  Please contact [facility] for night-coverage after hours (4p -7a ) and weekends on [email or web address]

## 2019-09-30 NOTE — Progress Notes (Signed)
ANTICOAGULATION CONSULT NOTE - Follow Up Consult  Pharmacy Consult for heparin Indication: ECMO  Labs: Recent Labs    09/29/19 1639 09/29/19 1639 09/30/19 0500 09/30/19 0508 09/30/19 1259 09/30/19 1259 09/30/19 1557 09/30/19 1704  HGB 8.8*   < > 8.4*   < > 9.9*   < > 9.0* 9.5*  HCT 31.0*   < > 29.4*   < > 29.0*  --  31.0* 28.0*  PLT 129*  --  113*  --   --   --  127*  --   APTT 54*  --  60*  --   --   --  56*  --   HEPARINUNFRC 0.40  --  0.35  --   --   --  0.35  --   CREATININE 0.95  --  0.78  --   --   --  0.73  --    < > = values in this interval not displayed.    Assessment: 44yo male on ECMO.  Heparin level came back therapeutic at 0.35, on heparin drip  1500 units/hr. Hgb stable about 10, plt 113 (down slightly). LDH stable 384., fibrinogen stable 445. No overt bleeding or complications noted.  Goal of Therapy:  Heparin level 0.3-0.4 units/ml   Plan:  Continue IV heparin at current rate of 1500 units/hr. Heparin levels and CBC with routine ECMO labs.  Bonnita Nasuti Pharm.D. CPP, BCPS Clinical Pharmacist [phone] 09/30/2019 6:29 PM    Please check AMION for all [place] phone numbers After 10:00 PM, call [place] [phone]  09/30/2019 6:28 PM

## 2019-09-30 NOTE — Progress Notes (Signed)
Nutrition Follow up  DOCUMENTATION CODES:   Obesity unspecified  INTERVENTION:   Continue bolus feedings:  -1 can Osmolite 1.5 six times daily (0000, 0400, 0800, 1200, 1600, 2000) -60 ml Prostat QID -Juven BID -Free water flushes 500 ml Q6 hours- per CCM  Provides: 3090 kcals, 214 grams protein, 1086 ml free water (3086 ml with flushes). Meets 100% of needs.   NUTRITION DIAGNOSIS:   Increased nutrient needs related to acute illness(COVID PNA) as evidenced by estimated needs.  Ongoing  GOAL:   Patient will meet greater than or equal to 90% of their needs   Addressed via TF  MONITOR:   Diet advancement, Skin, TF tolerance, Weight trends, Labs, I & O's  REASON FOR ASSESSMENT:   Consult Enteral/tube feeding initiation and management  ASSESSMENT:   Patient with PMH significant for GERD, HTN, and asthma. Presents this admission with COVID-19 PNA.   12/14- admit 12/23- chest xray revealed pneumomediastinum  12/24- developed bilateral pneumothoraces  12/26- worsening hypoxemia, intubated  12/27- s/p VV fem/fem ECMO cannulation  12/29- s/p trach  1/5- nasal bleeding- packing per CCM 1/7- nasal packing per ENT 1/8- trach exchanged, ECMO circuit changed  1/20- s/p additional RIJ drainage cannula  1/21- Cortrak kinked, replaced by OGT, pt vomited over night 1/22- ECMO circuit exchanged  1/23- nasal endoscopy, control of epistaxis  2/2- TEE, Cortrak removed, change to crescent catheter, removal of femoral lines 2/3- s/p PEG, EGD 2/15- developed recurrent PNA/sepsis   Pt discussed during ICU rounds and with RN.   VV ECMO day 58. All sedation off. No pressors. Plan 24 hr sweep trial today. BUN continues to be elevated. Nephrology to see if not improved by mid week. Hypernatremia continues, FWF adjusted per CCM (5.1 L free water deficit).   Tolerating Osmolite 1.5 bolus feedings and Juven. CBGs improved with increase of Q4 insulin and addition of basal.   Admission  weight: 136.1 kg Current weight: 132.6 (stable over the last week)  Patient requiring ventilator support via trach  MV: 11.6 L/min Temp (24hrs), Avg:98.2 F (36.8 C), Min:98.2 F (36.8 C), Max:98.2 F (36.8 C)  I/O: -11,941 ml since 2/8 UOP: 5,830 ml x 24 hrs   Medications: 500 mg Vitamin C daily, SS novolog, levemir, metamucil Labs: Na 149 (H) CBG 86-176 BUN 88 (H)- down from yesterday   Diet Order:   Diet Order    None      EDUCATION NEEDS:   Not appropriate for education at this time  Skin:  Skin Assessment: Skin Integrity Issues: Skin Integrity Issues:: DTI, Stage II, Stage III DTI: bilateral heels Stage II: buttocks Stage III: neck from trach ties Incisions: L wrist, nose Other: n/a  Last BM:  2/21  Height:   Ht Readings from Last 1 Encounters:  08/09/19 5' 8"  (1.727 m)    Weight:   Wt Readings from Last 1 Encounters:  09/30/19 132.6 kg    Ideal Body Weight:  70 kg  BMI:  Body mass index is 44.45 kg/m.  Estimated Nutritional Needs:   Kcal:  6811-5726 kcal (25-30 kcal/kg)  Protein:  180-215 grams  Fluid:  >/= 2.5 L/day   [name] RD, LDN Clinical Nutrition Pager # - [phone]

## 2019-09-30 NOTE — CV Procedure (Signed)
   ECMO NOTE:  Indication: Respiratory failure due to COVID PNA  Initial cannulation date: 08/03/2019  ECMO type: VV ECMO (Cardio-Help)  Dual lumen  Inflow/return cannula:  1) 32 FR Crescent placed 2/2  ECMO events:  Initial cannulation 12/26 Circuit changed and trach exchanged on 1/8.   Underwent addition of a RIJ drainage cannula on 1/20 Circuit changed on 1/22 for rising LDH Had evidence of recirculation and RIJ cannula pulled back on 1/26 19 FR RIJ removed 09/06/19 Underwent change to [facility] catheter with removal of femoral lines 2/2 2 hour sweep trial 2/21 Sedation weaned off 2/22  Daily data:  Remains on VV ecmo with good flows. Sedation weaned off    Flow 5.0 RPM 3600 dP 38 Pven  -87 Sweep  3/5 SVO2 78%  Labs:  Hgb 8.4 LDH 355 -> 384 Heparin level: 0.35 Lactic acid 1.1  Plan:  Crescent cannula working well Excellent flow and sats. Developed recurrent PNA and sepsis 2/15.  Cx with serratia. Continue meropenum and inhaled tobra.  Sedation turned off today. Follow mental status Continue to wean support.   Glori Bickers, MD  8:34 AM

## 2019-09-30 NOTE — Progress Notes (Signed)
ANTICOAGULATION CONSULT NOTE - Follow Up Consult  Pharmacy Consult for heparin Indication: ECMO  Labs: Recent Labs    09/29/19 0431 09/29/19 0440 09/29/19 1639 09/29/19 1639 09/30/19 0500 09/30/19 0508  HGB 8.7*   < > 8.8*   < > 8.4* 10.9*  HCT 30.7*   < > 31.0*  --  29.4* 32.0*  PLT 122*  --  129*  --  113*  --   APTT 77*  --  54*  --  60*  --   HEPARINUNFRC 0.53  --  0.40  --  0.35  --   CREATININE 0.67  --  0.95  --  0.78  --    < > = values in this interval not displayed.    Assessment: 44yo male on ECMO.  Heparin level came back therapeutic at 0.35, on 1500 units/hr. Hgb 10.9, plt 113 (down slightly). LDH 384., fibrinogen 445. No overt bleeding or complications noted.  Goal of Therapy:  Heparin level 0.3-0.4 units/ml   Plan:  Continue IV heparin at current rate of 1500 units/hr. Heparin levels and CBC with routine ECMO labs.  Antonietta Jewel, PharmD, BCCCP Clinical Pharmacist  Phone: [phone]  Please check AMION for all [place] phone numbers After 10:00 PM, call [place] [phone]  09/30/2019 7:08 AM

## 2019-10-01 ENCOUNTER — Inpatient Hospital Stay (HOSPITAL_COMMUNITY): Payer: Managed Care, Other (non HMO)

## 2019-10-01 LAB — COOXEMETRY PANEL
Carboxyhemoglobin: 2.3 % — ABNORMAL HIGH (ref 0.5–1.5)
Methemoglobin: 0.6 % (ref 0.0–1.5)
O2 Saturation: 74.6 %
Total hemoglobin: 8.6 g/dL — ABNORMAL LOW (ref 12.0–16.0)

## 2019-10-01 LAB — POCT I-STAT 7, (LYTES, BLD GAS, ICA,H+H)
Acid-Base Excess: 9 mmol/L — ABNORMAL HIGH (ref 0.0–2.0)
Acid-Base Excess: 7 mmol/L — ABNORMAL HIGH (ref 0.0–2.0)
Acid-Base Excess: 7 mmol/L — ABNORMAL HIGH (ref 0.0–2.0)
Acid-Base Excess: 7 mmol/L — ABNORMAL HIGH (ref 0.0–2.0)
Acid-Base Excess: 6 mmol/L — ABNORMAL HIGH (ref 0.0–2.0)
Acid-Base Excess: 6 mmol/L — ABNORMAL HIGH (ref 0.0–2.0)
Acid-Base Excess: 6 mmol/L — ABNORMAL HIGH (ref 0.0–2.0)
Bicarbonate: 33.5 mmol/L — ABNORMAL HIGH (ref 20.0–28.0)
Bicarbonate: 31.2 mmol/L — ABNORMAL HIGH (ref 20.0–28.0)
Bicarbonate: 32.2 mmol/L — ABNORMAL HIGH (ref 20.0–28.0)
Bicarbonate: 32.9 mmol/L — ABNORMAL HIGH (ref 20.0–28.0)
Bicarbonate: 31 mmol/L — ABNORMAL HIGH (ref 20.0–28.0)
Bicarbonate: 31.8 mmol/L — ABNORMAL HIGH (ref 20.0–28.0)
Bicarbonate: 31.6 mmol/L — ABNORMAL HIGH (ref 20.0–28.0)
Calcium, Ion: 1.27 mmol/L (ref 1.15–1.40)
Calcium, Ion: 1.25 mmol/L (ref 1.15–1.40)
Calcium, Ion: 1.25 mmol/L (ref 1.15–1.40)
Calcium, Ion: 1.28 mmol/L (ref 1.15–1.40)
Calcium, Ion: 1.29 mmol/L (ref 1.15–1.40)
Calcium, Ion: 1.26 mmol/L (ref 1.15–1.40)
Calcium, Ion: 1.27 mmol/L (ref 1.15–1.40)
HCT: 27 % — ABNORMAL LOW (ref 39.0–52.0)
HCT: 27 % — ABNORMAL LOW (ref 39.0–52.0)
HCT: 29 % — ABNORMAL LOW (ref 39.0–52.0)
HCT: 27 % — ABNORMAL LOW (ref 39.0–52.0)
HCT: 26 % — ABNORMAL LOW (ref 39.0–52.0)
HCT: 26 % — ABNORMAL LOW (ref 39.0–52.0)
HCT: 28 % — ABNORMAL LOW (ref 39.0–52.0)
Hemoglobin: 9.2 g/dL — ABNORMAL LOW (ref 13.0–17.0)
Hemoglobin: 9.2 g/dL — ABNORMAL LOW (ref 13.0–17.0)
Hemoglobin: 9.9 g/dL — ABNORMAL LOW (ref 13.0–17.0)
Hemoglobin: 9.2 g/dL — ABNORMAL LOW (ref 13.0–17.0)
Hemoglobin: 8.8 g/dL — ABNORMAL LOW (ref 13.0–17.0)
Hemoglobin: 8.8 g/dL — ABNORMAL LOW (ref 13.0–17.0)
Hemoglobin: 9.5 g/dL — ABNORMAL LOW (ref 13.0–17.0)
O2 Saturation: 98 %
O2 Saturation: 97 %
O2 Saturation: 95 %
O2 Saturation: 97 %
O2 Saturation: 96 %
O2 Saturation: 97 %
O2 Saturation: 97 %
Patient temperature: 36.9
Patient temperature: 98.4
Patient temperature: 98.4
Patient temperature: 99.3
Patient temperature: 99.3
Patient temperature: 98.9
Patient temperature: 98.8
Potassium: 4.5 mmol/L (ref 3.5–5.1)
Potassium: 4.5 mmol/L (ref 3.5–5.1)
Potassium: 4.5 mmol/L (ref 3.5–5.1)
Potassium: 4.4 mmol/L (ref 3.5–5.1)
Potassium: 4.5 mmol/L (ref 3.5–5.1)
Potassium: 4.6 mmol/L (ref 3.5–5.1)
Potassium: 4.5 mmol/L (ref 3.5–5.1)
Sodium: 149 mmol/L — ABNORMAL HIGH (ref 135–145)
Sodium: 148 mmol/L — ABNORMAL HIGH (ref 135–145)
Sodium: 148 mmol/L — ABNORMAL HIGH (ref 135–145)
Sodium: 149 mmol/L — ABNORMAL HIGH (ref 135–145)
Sodium: 147 mmol/L — ABNORMAL HIGH (ref 135–145)
Sodium: 148 mmol/L — ABNORMAL HIGH (ref 135–145)
Sodium: 148 mmol/L — ABNORMAL HIGH (ref 135–145)
TCO2: 35 mmol/L — ABNORMAL HIGH (ref 22–32)
TCO2: 33 mmol/L — ABNORMAL HIGH (ref 22–32)
TCO2: 34 mmol/L — ABNORMAL HIGH (ref 22–32)
TCO2: 34 mmol/L — ABNORMAL HIGH (ref 22–32)
TCO2: 32 mmol/L (ref 22–32)
TCO2: 33 mmol/L — ABNORMAL HIGH (ref 22–32)
TCO2: 33 mmol/L — ABNORMAL HIGH (ref 22–32)
pCO2 arterial: 46.4 mmHg (ref 32.0–48.0)
pCO2 arterial: 43 mmHg (ref 32.0–48.0)
pCO2 arterial: 49.4 mmHg — ABNORMAL HIGH (ref 32.0–48.0)
pCO2 arterial: 54.6 mmHg — ABNORMAL HIGH (ref 32.0–48.0)
pCO2 arterial: 48.6 mmHg — ABNORMAL HIGH (ref 32.0–48.0)
pCO2 arterial: 49.2 mmHg — ABNORMAL HIGH (ref 32.0–48.0)
pCO2 arterial: 48.2 mmHg — ABNORMAL HIGH (ref 32.0–48.0)
pH, Arterial: 7.466 — ABNORMAL HIGH (ref 7.350–7.450)
pH, Arterial: 7.469 — ABNORMAL HIGH (ref 7.350–7.450)
pH, Arterial: 7.422 (ref 7.350–7.450)
pH, Arterial: 7.39 (ref 7.350–7.450)
pH, Arterial: 7.414 (ref 7.350–7.450)
pH, Arterial: 7.418 (ref 7.350–7.450)
pH, Arterial: 7.425 (ref 7.350–7.450)
pO2, Arterial: 92 mmHg (ref 83.0–108.0)
pO2, Arterial: 83 mmHg (ref 83.0–108.0)
pO2, Arterial: 75 mmHg — ABNORMAL LOW (ref 83.0–108.0)
pO2, Arterial: 94 mmHg (ref 83.0–108.0)
pO2, Arterial: 80 mmHg — ABNORMAL LOW (ref 83.0–108.0)
pO2, Arterial: 88 mmHg (ref 83.0–108.0)
pO2, Arterial: 88 mmHg (ref 83.0–108.0)

## 2019-10-01 LAB — CBC
HCT: 30.5 % — ABNORMAL LOW (ref 39.0–52.0)
HCT: 30.2 % — ABNORMAL LOW (ref 39.0–52.0)
Hemoglobin: 8.8 g/dL — ABNORMAL LOW (ref 13.0–17.0)
Hemoglobin: 8.7 g/dL — ABNORMAL LOW (ref 13.0–17.0)
MCH: 27.9 pg (ref 26.0–34.0)
MCH: 27.7 pg (ref 26.0–34.0)
MCHC: 28.9 g/dL — ABNORMAL LOW (ref 30.0–36.0)
MCHC: 28.8 g/dL — ABNORMAL LOW (ref 30.0–36.0)
MCV: 96.8 fL (ref 80.0–100.0)
MCV: 96.2 fL (ref 80.0–100.0)
Platelets: 124 10*3/uL — ABNORMAL LOW (ref 150–400)
Platelets: 126 10*3/uL — ABNORMAL LOW (ref 150–400)
RBC: 3.15 MIL/uL — ABNORMAL LOW (ref 4.22–5.81)
RBC: 3.14 MIL/uL — ABNORMAL LOW (ref 4.22–5.81)
RDW: 17.2 % — ABNORMAL HIGH (ref 11.5–15.5)
RDW: 17.2 % — ABNORMAL HIGH (ref 11.5–15.5)
WBC: 11.9 10*3/uL — ABNORMAL HIGH (ref 4.0–10.5)
WBC: 14.1 10*3/uL — ABNORMAL HIGH (ref 4.0–10.5)
nRBC: 0.2 % (ref 0.0–0.2)
nRBC: 0 % (ref 0.0–0.2)

## 2019-10-01 LAB — GLUCOSE, CAPILLARY
Glucose-Capillary: 102 mg/dL — ABNORMAL HIGH (ref 70–99)
Glucose-Capillary: 104 mg/dL — ABNORMAL HIGH (ref 70–99)
Glucose-Capillary: 116 mg/dL — ABNORMAL HIGH (ref 70–99)
Glucose-Capillary: 120 mg/dL — ABNORMAL HIGH (ref 70–99)
Glucose-Capillary: 86 mg/dL (ref 70–99)
Glucose-Capillary: 95 mg/dL (ref 70–99)
Glucose-Capillary: 96 mg/dL (ref 70–99)

## 2019-10-01 LAB — BASIC METABOLIC PANEL
Anion gap: 9 (ref 5–15)
Anion gap: 7 (ref 5–15)
BUN: 85 mg/dL — ABNORMAL HIGH (ref 6–20)
BUN: 94 mg/dL — ABNORMAL HIGH (ref 6–20)
CO2: 30 mmol/L (ref 22–32)
CO2: 30 mmol/L (ref 22–32)
Calcium: 8.9 mg/dL (ref 8.9–10.3)
Calcium: 8.9 mg/dL (ref 8.9–10.3)
Chloride: 108 mmol/L (ref 98–111)
Chloride: 109 mmol/L (ref 98–111)
Creatinine, Ser: 0.76 mg/dL (ref 0.61–1.24)
Creatinine, Ser: 0.71 mg/dL (ref 0.61–1.24)
GFR calc Af Amer: >60 mL/min (ref >60)
GFR calc Af Amer: >60 mL/min (ref >60)
GFR calc non Af Amer: >60 mL/min (ref >60)
GFR calc non Af Amer: >60 mL/min (ref >60)
Glucose, Bld: 87 mg/dL (ref 70–99)
Glucose, Bld: 109 mg/dL — ABNORMAL HIGH (ref 70–99)
Potassium: 4.5 mmol/L (ref 3.5–5.1)
Potassium: 4.6 mmol/L (ref 3.5–5.1)
Sodium: 147 mmol/L — ABNORMAL HIGH (ref 135–145)
Sodium: 146 mmol/L — ABNORMAL HIGH (ref 135–145)

## 2019-10-01 LAB — MAGNESIUM: Magnesium: 2.1 mg/dL (ref 1.7–2.4)

## 2019-10-01 LAB — LACTATE DEHYDROGENASE: LDH: 412 U/L — ABNORMAL HIGH (ref 98–192)

## 2019-10-01 LAB — HEPARIN LEVEL (UNFRACTIONATED)
Heparin Unfractionated: 0.39 IU/mL (ref 0.30–0.70)
Heparin Unfractionated: 0.39 IU/mL (ref 0.30–0.70)

## 2019-10-01 LAB — PHOSPHORUS: Phosphorus: 3.8 mg/dL (ref 2.5–4.6)

## 2019-10-01 LAB — FIBRINOGEN: Fibrinogen: 453 mg/dL (ref 210–475)

## 2019-10-01 LAB — APTT
aPTT: 65 seconds — ABNORMAL HIGH (ref 24–36)
aPTT: 61 seconds — ABNORMAL HIGH (ref 24–36)

## 2019-10-01 LAB — LACTIC ACID, PLASMA: Lactic Acid, Venous: 1.2 mmol/L (ref 0.5–1.9)

## 2019-10-01 MED ORDER — FUROSEMIDE 10 MG/ML IJ SOLN: 40.0000 mg | Freq: Once | INTRAMUSCULAR | Status: DC

## 2019-10-01 MED ORDER — SODIUM CHLORIDE 0.9 % IV SOLN
2.0000 g | Freq: Three times a day (TID) | INTRAVENOUS | Status: AC
  Administered 2019-10-01 – 2019-10-06 (×17): 2 g via INTRAVENOUS
  Filled 2019-10-01 (×20): qty 2

## 2019-10-01 MED ORDER — HYDRALAZINE HCL 25 MG PO TABS
25.0000 mg | ORAL_TABLET | Freq: Three times a day (TID) | ORAL | Status: DC
  Administered 2019-10-01 – 2019-10-05 (×12): 25 mg
  Filled 2019-10-01 (×12): qty 1

## 2019-10-01 MED ORDER — FUROSEMIDE 10 MG/ML IJ SOLN
INTRAMUSCULAR | Status: AC
  Filled 2019-10-01: qty 4

## 2019-10-01 MED ORDER — MAGIC MOUTHWASH
5.0000 mL | Freq: Three times a day (TID) | ORAL | Status: DC
  Administered 2019-10-01 – 2019-10-27 (×76): 5 mL via ORAL
  Filled 2019-10-01 (×74): qty 5

## 2019-10-01 MED ORDER — FUROSEMIDE 10 MG/ML IJ SOLN
40.0000 mg | Freq: Once | INTRAMUSCULAR | Status: AC
  Administered 2019-10-01: 19:00:00 40 mg via INTRAVENOUS

## 2019-10-01 NOTE — CV Procedure (Signed)
   ECMO NOTE:  Indication: Respiratory failure due to COVID PNA  Initial cannulation date: 08/03/2019  ECMO type: VV ECMO (Cardio-Help)  Dual lumen  Inflow/return cannula:  1) 32 FR Crescent placed 2/2  ECMO events:  Initial cannulation 12/26 Circuit changed and trach exchanged on 1/8.   Underwent addition of a RIJ drainage cannula on 1/20 Circuit changed on 1/22 for rising LDH Had evidence of recirculation and RIJ cannula pulled back on 1/26 19 FR RIJ removed 09/06/19 Underwent change to [facility] catheter with removal of femoral lines 2/2 2 hour sweep trial 2/21 Sedation weaned off 2/22  Daily data:  Remains on VV ecmo with good flows. Sedation weaned off. Awake but not following commands.    Flow 5.1 RPM 3600 dP 38 Pven  -88 Sweep  3 SVO2 79.6%  Labs:  Hgb 8.8 LDH 355 -> 384 -> 412 Heparin level: 0.39 Lactic acid 1.2  Plan:  Crescent cannula working well Excellent flow and sats. Developed recurrent PNA and sepsis 2/15.  Cx with serratia. Continue meropenum today is 8/10 Sedation turned off 2/22. Awake but not following commadns Continue to wean support. Turn sweep to 2 today. Vent on minimal settings.   Glori Bickers, MD  8:34 AM

## 2019-10-01 NOTE — Progress Notes (Signed)
Patient placed on sweep trial at 0922, vent FIO2 increased to 40%. ABG obtained before sweep trial, at 30 min, and just before the trial was stopped at 1126 due to increased WOB.  Sweep turned on to prior setting of 2 lpm and FIO2 on vent returned to 30%.  Results for [name], [name] (MRN [id number]) as of 10/01/2019 12:18  Ref. Range 10/01/2019 09:12 10/01/2019 09:59 10/01/2019 11:17 10/01/2019 11:20  Sample type Unknown ARTERIAL ARTERIAL  ARTERIAL  pH, Arterial Latest Ref Range: 7.350 - 7.450  7.469 (H) 7.422  7.390  pCO2 arterial Latest Ref Range: 32.0 - 48.0 mmHg 43.0 49.4 (H)  54.6 (H)  pO2, Arterial Latest Ref Range: 83.0 - 108.0 mmHg 83.0 75.0 (L)  94.0  TCO2 Latest Ref Range: 22 - 32 mmol/L 33 (H) 34 (H)  34 (H)  Acid-Base Excess Latest Ref Range: 0.0 - 2.0 mmol/L 7.0 (H) 7.0 (H)  7.0 (H)  Bicarbonate Latest Ref Range: 20.0 - 28.0 mmol/L 31.2 (H) 32.2 (H)  32.9 (H)  O2 Saturation Latest Units: % 97.0 95.0  97.0  Patient temperature Unknown 98.4 F 98.4 F  99.3 F  Collection site Unknown ARTERIAL LINE ARTERIAL LINE  ARTERIAL LINE  Sodium Latest Ref Range: 135 - 145 mmol/L 148 (H) 148 (H)  149 (H)  Potassium Latest Ref Range: 3.5 - 5.1 mmol/L 4.5 4.5  4.4  Calcium Ionized Latest Ref Range: 1.15 - 1.40 mmol/L 1.25 1.25  1.28  Hemoglobin Latest Ref Range: 13.0 - 17.0 g/dL 9.2 (L) 9.9 (L)  9.2 (L)  HCT Latest Ref Range: 39.0 - 52.0 % 27.0 (L) 29.0 (L)  27.0 (L)

## 2019-10-01 NOTE — Progress Notes (Signed)
Patient ID: [name], male   DOB: January 22, 1976, 44 y.o.   MRN: [id number]    Advanced Heart Failure Rounding Note   Subjective:    Remains on VV ecmo.  BAL growing > 100K serratia. On meropenum day 8/10  CVP 5-6  Sweep 8 -> 3.5 -> 3  On vent at 30% TVs > 450-500cc  CXR with diffuse infiltrates. Unchanged today. Personally reviewed  Awake on vent but will not will follow commands.   ABG 7.46/47/92/98%    Co-ox 74%  LDH 384 -> 412  hgb 8.4  Sodium 148  BUN 138 -> 127 -> 122-> 108 -> 97 -> 89 -> 88 ->85 Cr 0.67 -> 0.78 -> 0.76  ECMO parameters - see separate ecmo note   Objective:   Weight Range:  Vital Signs:   Temp:  [98.2 F (36.8 C)-98.6 F (37 C)] 98.4 F (36.9 C) (02/23 0754) Pulse Rate:  [83-103] 91 (02/23 0700) Resp:  [22-36] 33 (02/23 0700) BP: (171-192)/(72-81) 174/81 (02/22 1834) SpO2:  [96 %-100 %] 98 % (02/23 0700) Arterial Line BP: (141-189)/(57-80) 164/71 (02/23 0700) FiO2 (%):  [30 %-40 %] 30 % (02/23 0400) Weight:  [130.5 kg] 130.5 kg (02/23 0500) Last BM Date: 09/30/19  Weight change: Filed Weights   09/29/19 0500 09/30/19 0500 10/01/19 0500  Weight: 133.3 kg 132.6 kg 130.5 kg    Intake/Output:   Intake/Output Summary (Last 24 hours) at 10/01/2019 0803 Last data filed at 10/01/2019 0700 Gross per 24 hour  Intake 4609.34 ml  Output 4725 ml  Net -115.66 ml     Physical Exam: General:  Awake on vent/ECMO but not following commands HEENT: normal Neck: supple. RIJ cannula Carotids 2+ bilat; no bruits. No lymphadenopathy or thryomegaly appreciated. Cor: PMI nondisplaced. Regular rate & rhythm. No rubs, gallops or murmurs. Lungs: clear Abdomen: obese soft, nontender, nondistended. No hepatosplenomegaly. No bruits or masses. Good bowel sounds. Extremities: no cyanosis, clubbing, rash, 1+ edema + DTI Neuro:  Awake on vent/ECMO but not following commandspleasant   Telemetry: Sinus 90-100s Personally reviewed   Labs: Basic  Metabolic Panel: Recent Labs  Lab 09/27/19 0458 09/27/19 1227 09/28/19 0455 09/28/19 0512 09/29/19 0431 09/29/19 0440 09/29/19 1639 09/29/19 1639 09/30/19 0500 09/30/19 0508 09/30/19 1557 09/30/19 1704 09/30/19 1846 10/01/19 0432 10/01/19 0445  NA 153*   < > 153*   < > 152*   < > 151*   < > 148*   < > 146* 148* 149* 149* 147*  K 4.1   < > 4.4   < > 4.7   < > 4.8   < > 4.7   < > 4.8 4.6 4.6 4.5 4.5  CL 112*   < > 114*   < > 113*  --  111  --  106  --  107  --   --   --  108  CO2 32   < > 30   < > 32  --  32  --  31  --  31  --   --   --  30  GLUCOSE 147*   < > 173*   < > 156*  --  138*  --  115*  --  130*  --   --   --  87  BUN 89*   < > 92*   < > 88*  --  97*  --  88*  --  96*  --   --   --  85*  CREATININE 0.67   < > 0.88   < > 0.67  --  0.95  --  0.78  --  0.73  --   --   --  0.76  CALCIUM 9.2   < > 8.9   < > 9.0   < > 9.0   < > 8.8*  --  8.9  --   --   --  8.9  MG 2.5*  --  2.2  --  2.3  --   --   --  2.2  --   --   --   --   --  2.1  PHOS 3.4  --  3.5  --  4.2  --   --   --  4.3  --   --   --   --   --  3.8   < > = values in this interval not displayed.    Liver Function Tests: No results for input(s): AST, ALT, ALKPHOS, BILITOT, PROT, ALBUMIN in the last 168 hours. No results for input(s): LIPASE, AMYLASE in the last 168 hours. No results for input(s): AMMONIA in the last 168 hours.  CBC: Recent Labs  Lab 09/29/19 0431 09/29/19 0440 09/29/19 1639 09/29/19 1639 09/30/19 0500 09/30/19 0508 09/30/19 1557 09/30/19 1704 09/30/19 1846 10/01/19 0432 10/01/19 0445  WBC 11.8*  --  14.5*  --  11.5*  --  15.7*  --   --   --  11.9*  HGB 8.7*   < > 8.8*   < > 8.4*   < > 9.0* 9.5* 10.2* 9.2* 8.8*  HCT 30.7*   < > 31.0*   < > 29.4*   < > 31.0* 28.0* 30.0* 27.0* 30.5*  MCV 99.0  --  98.1  --  96.4  --  95.7  --   --   --  96.8  PLT 122*  --  129*  --  113*  --  127*  --   --   --  124*   < > = values in this interval not displayed.    Cardiac Enzymes: No results for  input(s): CKTOTAL, CKMB, CKMBINDEX, TROPONINI in the last 168 hours.  BNP: BNP (last 3 results) Recent Labs    07/22/19 1039  BNP 15.3    ProBNP (last 3 results) No results for input(s): PROBNP in the last 8760 hours.    Other results:  Imaging: DG CHEST PORT 1 VIEW  Result Date: 09/30/2019 CLINICAL DATA:  COVID pneumonia, ECMO. EXAM: PORTABLE CHEST 1 VIEW COMPARISON:  Multiple previous chest films. FINDINGS: The tracheostomy tube is stable. Persistent patchy coarse bilateral infiltrates but slight improved aeration when compared to the 2 most previous chest films. No pleural effusions. No pneumothorax. IMPRESSION: Persistent bilateral coarse infiltrates but slight improved lung aeration. Electronically Signed   By: Marijo Sanes M.D.   On: 09/30/2019 08:00     Medications:     Scheduled Medications: . amiodarone  200 mg Oral Daily  . amLODipine  10 mg Oral Daily  . vitamin C  500 mg Per Tube Daily  . chlorhexidine gluconate (MEDLINE KIT)  15 mL Mouth Rinse BID  . Chlorhexidine Gluconate Cloth  6 each Topical Daily  . cloNIDine  0.1 mg Oral Q6H  . feeding supplement (OSMOLITE 1.5 CAL)  237 mL Per Tube 6 X Daily  . feeding supplement (PRO-STAT SUGAR FREE 64)  60 mL Per Tube QID  . free water  500 mL Per Tube Q6H  .  hydrALAZINE  10 mg Oral Q8H  . insulin aspart  0-20 Units Subcutaneous Q4H  . insulin aspart  5 Units Subcutaneous Q4H  . insulin detemir  18 Units Subcutaneous BID  . ipratropium-albuterol  3 mL Nebulization Q4H  . mouth rinse  15 mL Mouth Rinse 10 times per day  . montelukast  10 mg Per Tube QHS  . nutrition supplement (JUVEN)  1 packet Per Tube BID BM  . pantoprazole sodium  40 mg Per Tube BID  . propranolol  80 mg Per Tube TID  . psyllium  1 packet Oral BID  . sodium chloride flush  10-40 mL Intracatheter Q12H  . sodium chloride flush  10-40 mL Intracatheter Q12H  . white petrolatum   Topical BID    Infusions: . sodium chloride    . sodium chloride  Stopped (09/30/19 0734)  . sodium chloride 10 mL/hr at 10/01/19 0700  . dexmedetomidine (PRECEDEX) IV infusion Stopped (09/23/19 1336)  . heparin 1,500 Units/hr (10/01/19 0700)  . meropenem (MERREM) IV      PRN Medications: Place/Maintain arterial line **AND** sodium chloride, sodium chloride, sodium chloride, acetaminophen (TYLENOL) oral liquid 160 mg/5 mL, acetaminophen, [COMPLETED] diazepam **FOLLOWED BY** diazepam, hydrALAZINE, HYDROmorphone, magic mouthwash, magnesium hydroxide, midazolam, ondansetron (ZOFRAN) IV, senna-docusate, sodium chloride, sodium chloride flush   Assessment/Plan:    1. Acute hypoxic/hypercarbic respiratory failure due to COVID PNA: Remained markedly hypoxic despite full support. Intubated 12/26. S/p bilateral CTs 12/26 for pneumothoraces.  TEE on 12/26 LVEF 70% RV ok. VV ECMO begun 12/26. Tracheostomy 12/29.  COVID ARDS at this point. ECMO circuit changed 1/8 & 1/23. Crescent cannula placed 2/2 (femoral cannuals removed) - ECMO parameters as per ECMO note - Flows look good on Crescent - Developed PNA and sepsis on 2/15.  BAL with > 100K serratia. Finished nebulized tobra. On mero. Today day 8/10 of meropenum - Off all sedation since 2/22 - Will watch mental status and BUN. If not waking up by midweek will need repeat head CT and brief HD with Renal to clean. I suspect this is just lingering sedation effects - Continue to daily sweep trials and autowean of sweep. Turn sweep to 2 today - Volume status ok. Weight down. Would not give lasix unless needed as we are trying to get BUN down - LDH 384 -> 412 - Continue heparin. Level 0.39 Discussed dosing with PharmD personally. - Continue chest PT  2. Bilateral PTX: Due to barotrauma.  - Resolved. CTs out  3. COVID PNA: management of COVID infection per CCM. CXR with bilateral multifocal PNA progressing to likely ARDS.  - s/p 10 days of remdesivir - 12/16, 12/2 s/p tociluzimab - 12/17 s/p convalescent plasma -  Decadron at 105m/daily completed 1/11 - Completed cefepime/vanc - Off Ancef  4. ID: Empiric coverage with vancomycin/cefepime initially for possible secondary bacterial PNA, course completed.   - treated with cefepime and vanc for + cx with staph epidimidis, MSSA and serratia (sensitivities ok). - developed recurrent PNA and sepsis 2/15 - BAL with > 100K serratia. On mero.   - Developed PNA and sepsis on 2/15.  BAL with > 100K serratia. Finished nebulized tobra. On mero. Today day 8/10 of meropenum  5. Anemia: - hgb 10.5 -> 8.2 -> 7.5 -. 8.3 -> 9.5 -> 8.6 -> 8.8  today. Transfuse as needed to keep Hgb> 8.0  7. FEN - now with G-tube.Continue TFs  8. Hypernatremia - Na 147 - Continue FW. Can add IVF as needed  9.  AKI - Volume status looks good. AKI and azotemia improved with IVF and FW. See discussion above  10. DTIs - WOC following. Has evolving wound on left 5th toe  D/w with ECMO team (ECMO coordinator/specialists, CCM, TCTS and pharmD) at bedside on multidisciplinary rounds. Wean sedation. Continue to wean sweep. Details as above.   Length of Stay: 63  CRITICAL CARE Performed by: Glori Bickers  Total critical care time: 40 minutes  Critical care time was exclusive of separately billable procedures and treating other patients.  Critical care was necessary to treat or prevent imminent or life-threatening deterioration.  Critical care was time spent personally by me (independent of midlevel providers or residents) on the following activities: development of treatment plan with patient and/or surrogate as well as nursing, discussions with consultants, evaluation of patient's response to treatment, examination of patient, obtaining history from patient or surrogate, ordering and performing treatments and interventions, ordering and review of laboratory studies, ordering and review of radiographic studies, pulse oximetry and re-evaluation of patient's condition.    Glori Bickers MD 10/01/2019, 8:03 AM  Advanced Heart Failure Team Pager [phone] (M-F; 7a - 4p)  Please contact [facility] for night-coverage after hours (4p -7a ) and weekends on [email or web address]

## 2019-10-01 NOTE — TOC Progression Note (Signed)
Transition of Care [facility]) - Progression Note    Patient Details  Name: [name] MRN: [id number] Date of Birth: Dec 27, 1975  [facility] CM/SW Contact  Graves-Bigelow, Ocie Cornfield, RN Phone Number: 10/01/2019, 3:28 PM  Clinical Narrative:  Patient presented for hypoxic respiratory failure due to COVID Pneumonia. Patient remains on vent and ECMO-sweep trial at this time. Physical Therapy recommendations are for long term acute care (LTAC)- unable to accept the patient on ECMO; however, once off of [place] may be considered if insurance approves and if family is agreeable to LTAC. Case Manager did reach out to wife and will be in touch regarding plan of care as the patient progresses. Case Manager will continueto follow for transition of care needs.     Expected Discharge Plan: [place] Barriers to Discharge: Continued Medical Work up(Continues on IV Rocephin, Nimbex and Precedex.)  Expected Discharge Plan and Services Expected Discharge Plan: [name]   Discharge Planning Services: CM Consult   Living arrangements for the past 2 months: Single Family Home                    Social Determinants of Health (SDOH) Interventions    Readmission Risk Interventions No flowsheet data found.

## 2019-10-01 NOTE — Progress Notes (Signed)
ANTICOAGULATION CONSULT NOTE - Follow Up Consult  Pharmacy Consult for heparin Indication: ECMO  Labs: Recent Labs    09/30/19 0500 09/30/19 0508 09/30/19 1557 09/30/19 1704 09/30/19 1846 09/30/19 1846 10/01/19 0432 10/01/19 0445  HGB 8.4*   < > 9.0*   < > 10.2*   < > 9.2* 8.8*  HCT 29.4*   < > 31.0*   < > 30.0*  --  27.0* 30.5*  PLT 113*  --  127*  --   --   --   --  124*  APTT 60*  --  56*  --   --   --   --  65*  HEPARINUNFRC 0.35  --  0.35  --   --   --   --  0.39  CREATININE 0.78  --  0.73  --   --   --   --  0.76   < > = values in this interval not displayed.    Assessment: 44yo male on ECMO.  Heparin level came back therapeutic at 0.39, on 1500 units/hr. Hgb down to 8.8 today, plt 124 (up slightly). LDH 412, fibrinogen 453. No overt bleeding or complications noted.  Goal of Therapy:  Heparin level 0.3-0.4 units/ml   Plan:  Continue IV heparin at current rate of 1500 units/hr. Heparin levels and CBC with routine ECMO labs.  Antonietta Jewel, PharmD, BCCCP Clinical Pharmacist  Phone: [phone]  Please check AMION for all [place] phone numbers After 10:00 PM, call [place] [phone]  10/01/2019 7:10 AM

## 2019-10-01 NOTE — Progress Notes (Signed)
ANTICOAGULATION CONSULT NOTE - Follow Up Consult  Pharmacy Consult for heparin Indication: ECMO  Labs: Recent Labs    09/30/19 1557 09/30/19 1704 10/01/19 0445 10/01/19 0912 10/01/19 1334 10/01/19 1334 10/01/19 1449 10/01/19 1551  HGB 9.0*   < > 8.8*   < > 8.8*   < > 8.8* 8.7*  HCT 31.0*   < > 30.5*   < > 26.0*  --  26.0* 30.2*  PLT 127*  --  124*  --   --   --   --  126*  APTT 56*  --  65*  --   --   --   --  61*  HEPARINUNFRC 0.35  --  0.39  --   --   --   --  0.39  CREATININE 0.73  --  0.76  --   --   --   --  0.71   < > = values in this interval not displayed.    Assessment: 44yo male on ECMO.  Heparin level remains therapeutic at 0.39 on 1500 units/hr. Hgb relatively stable at 8.7, plt 126 stable. LDH 412 (up), fibrinogen 453 stable. No overt bleeding or complications reported.  Goal of Therapy:  Heparin level 0.3-0.4 units/ml   Plan:  Continue IV heparin at current rate of 1500 units/hr. Heparin levels and CBC with routine ECMO labs. Monitor for s/sx bleeding   Elicia Lamp, PharmD, BCPS Please check AMION for all [place] contact numbers Clinical Pharmacist 10/01/2019 5:46 PM

## 2019-10-01 NOTE — Progress Notes (Signed)
Patient ID: [name], male   DOB: 1975-08-13, 44 y.o.   MRN: [id number]  This NP visited patient at the bedside as a follow up for palliative medicine needs and emotional support.  Patient remains on VV ECMO circuit.  Patient remains critically ill.  Patient's treatment plan discussed with bedside RN.  Palliative medicine continue to support holistically.  Spoke to the patient's wife [name] by telephone, I introduced myself as a provider with the palliative medicine team available to her and her family as an extra layer support.    Created space and opportunity for the wife to explore thoughts and feelings regarding her husband's current medical situation.  "It has been a long haul".  She tells me that the patient's mother visits on a more regular basis since the patient's wife works.  We discussed how difficult it can be trying to be present for a critically ill patient and balancing outside responsibilities.  She did share with me how grateful she was for her visit on Valentine's Day and how "wonderful" the staff was making the room special in the visit special.  Currently wife does not have any questions or concerns.  I shared with wife the importance of continued conversation with her  family and the medical providers regarding overall plan of care and treatment options,  ensuring decisions are within the context of the patients values and GOCs.  Questions and concerns addressed        Total time spent on the unit was 25 minutes.Marland Kitchen PMT will continue to support holistically.  Greater than 50% of the time was spent in counseling and coordination of care  Wadie Lessen NP  Palliative Medicine Team Team Phone # [phone] Pager [phone]

## 2019-10-01 NOTE — Progress Notes (Signed)
NAME:  [name], MRN:  [id number], DOB:  April 08, 1976, LOS: 90 ADMISSION DATE:  07/22/2019, CONSULTATION DATE:  12/24 REFERRING MD:  Sloan Leiter, CHIEF COMPLAINT:  Dyspnea   Brief History   44 y/o male admitted 12/14 with COVID pneumonia leading to ARDS, pneumomediastinum.  Intubated 12/26, bilateral chest tubes placed.  Past Medical History  GERD HTN Asthma  Significant Hospital Events   12/14 admit with hypoxemic respiratory failure in setting of COVID-19 pneumonia 12/23 noted to have pneumomediastinum on chest x-ray 12/24PCCM consulted for evaluation 12/26 worsening hypoxemia, intubated 12/27 VV fem/fem ECMO cannulation 12/29 tracheostomy >> See bedside nursing event notes for full rundown of procedures after 12/27 1/5 epistaxis, nasal packing 1/30 improved cxr, improved lung compliance, TV 350s on 15PC  1/31 CXR increased infiltrates, +CFB     2/2 Converted to right IJ Crescent cannula. 2/3 PEG tube inserted at bedside 2/14 30 min sweep trial> paO2 105 (18 PEEP, 60% FiO2) pH 7.22, pCO2 87 2/20 2 hours sweep trial 2/21 1.5 hour sweep trial  Consults:  PCCM, CTS, CHF, Palliative  Procedures:  PICC 1/3 >> R radial a-line 1/20 >> R IJ ECMO cath 2/3 >> Tracheostomy 12/29 >>  Significant Diagnostic Tests:  CT chest 12/22>>extensive pneumomediastinum, multifocal patchy bilateral groundglass opacities CXR 1/25 >> improving bilateral infiltrates. ECMO cannulae in place. CXR 2/5 >> clearing bilateral infiltrates  Micro Data:  BCx2 12/14 >>negative  Sputum 12/15 >> OPF 12/28 blood > negative 12/29 > staph aureus 1/4 bronch:  1/2 acid fast smear bronch: negative 1/2 fungal cx bronch: negative1/12 BAL: serratia, staph epidermidisu 1/4 BAL > MSSA, Serratia 1/9 blood > negative 1/9 Resp culture > MSSA, serratia 1/11 serratia > negative 1/12 fungus culture > negative 1/12 BAL > serratia 1/18 BAL >> rare Serratia. 1/24 blood > negative 1/30 blood > negative 1/30  resp culture > serratia 2/1 BAL >> rare Serratia - enterococcus 2/1 aspergillus Ag > negative 2/2 resp culture > serratia  2/2 blood > negative 2/5 urine > negative 2/9 resp: serratia 2/15 BAL serratia  Antimicrobials:  Received remdesivir, steroids, actemra and convalescent plasma  5 days cefepime 12/24-12/29 Cefepime 1/4->1/13 vanc 1/4> 1/11 Ceftriaxone 1/14 > 1/27 vanc x1 1/20 Cefazolin 1/29 > 2/2 cefuroxim 2/2 vanc 2/2 Ceftriaxone x1 2/4 Meropenem 2/15>>2/21 Tobramycin inhaled 2/15>>2/21  Interim history/subjective:  Tolerated 2h sweep trial. Terminated for increasing WOB.  Good response to diuretics.  Objective   Blood pressure (!) 174/81, pulse 91, temperature 98.4 F (36.9 C), resp. rate (!) 33, height _0  (1.727 m), weight 130.5 kg, SpO2 98 %. CVP:  [1 mmHg-9 mmHg] 5 mmHg  Vent Mode: PCV FiO2 (%):  [30 %-40 %] 30 % Set Rate:  [20 bmp] 20 bmp PEEP:  [10 cmH20] 10 cmH20 Plateau Pressure:  [24 cmH20-26 cmH20] 26 cmH20   Intake/Output Summary (Last 24 hours) at 10/01/2019 0758 Last data filed at 10/01/2019 0700 Gross per 24 hour  Intake 4877.13 ml  Output 4925 ml  Net -47.87 ml   Filed Weights   09/29/19 0500 09/30/19 0500 10/01/19 0500  Weight: 133.3 kg 132.6 kg 130.5 kg    Examination: GEN: ill appearing obese man on vent HEENT: RIJ Crescent cannula. Tracheostomy in place with no bleeding. CV: RRR, ext warm. HS normal PULM: visible respiratory effort. Clear lungs to posterior axillary line. GI: Soft, PEG CDI EXT: Anasarca improved but still +2 edema in lower extremities NEURO: Opens eyes to voice, tolerating sitting position with minimal assistance. Will orient to voice  and turn head but not able to move limbs even to pain. PSYCH: RASS -1  SKIN: Pale  Vt 450-500 Sweep: 3L/min Flow: 5Lmin  Resolved Hospital Problem list   Pneumothoraces-resolved.  Assessment & Plan:   ARDS due to COVID 19, requiring mechanical ventilation and VV ECMO  -  Improving, lingering issues with HCAP vs. Bronchitis and profound deconditioning. - Daily sweep trials- will consider overnight trial once able to tolerate 4h.  - Poor lung compliance and diaphragmatic weakness limiting ventilation.    AKI with azotemia - improving  Positive cumulative fluid balance Hypernatremia Severe third spacing -FW replacement - attempting to maintain neg fluid balance   Hypertension - clonidine, amlodipine and propanolol  - add hydralazine  Serratia pneumonia> treated Stop date 2/22  Neuro muscular weakness Appreciate PT input.  For range of motion exercises.  Bilateral lower feet deep tissue injury Wound care following  Best practice:  Pain/Anxiety/Delirium protocol (if indicated): off all sedation. PRN use only. VAP protocol (if indicated): Bundle in place. Respiratory support goals: Continue current ultra lung protective strategy.  Daily sweep trials. DVT prophylaxis: Heparin drip, goal 0.3-0.5 GI prophylaxis: PPI Nutrition: TF Fluid status goals:  Attempting to maintain negative fluid balance Urinary catheter: Guide hemodynamic management Glucose control: controlled with current regimen Mobility/therapy needs: Bedrest.  Daily labs: as per ECMO protocol. Code Status: Full code  Family Communication: Wife updated 2/23 Disposition: ICU  Labs   CBC: CBC Latest Ref Rng & Units 10/01/2019 10/01/2019 09/30/2019  WBC 4.0 - 10.5 K/uL 11.9(H) - -  Hemoglobin 13.0 - 17.0 g/dL 8.8(L) 9.2(L) 10.2(L)  Hematocrit 39.0 - 52.0 % 30.5(L) 27.0(L) 30.0(L)  Platelets 150 - 400 K/uL 124(L) - -     Basic Metabolic Panel: BMP Latest Ref Rng & Units 10/01/2019 10/01/2019 09/30/2019  Glucose 70 - 99 mg/dL 87 - -  BUN 6 - 20 mg/dL 85(H) - -  Creatinine 0.61 - 1.24 mg/dL 0.76 - -  Sodium 135 - 145 mmol/L 147(H) 149(H) 149(H)  Potassium 3.5 - 5.1 mmol/L 4.5 4.5 4.6  Chloride 98 - 111 mmol/L 108 - -  CO2 22 - 32 mmol/L 30 - -  Calcium 8.9 - 10.3 mg/dL 8.9 - -      GFR: Estimated Creatinine Clearance: 153.7 mL/min (by C-G formula based on SCr of 0.76 mg/dL). Recent Labs  Lab 09/28/19 0455 09/28/19 1626 09/29/19 0431 09/29/19 0431 09/29/19 1639 09/30/19 0500 09/30/19 1557 10/01/19 0445  WBC 12.0*   < > 11.8*   < > 14.5* 11.5* 15.7* 11.9*  LATICACIDVEN 1.1  --  1.1  --   --  1.2  --  1.2   < > = values in this interval not displayed.    Liver Function Tests: No results for input(s): AST, ALT, ALKPHOS, BILITOT, PROT, ALBUMIN in the last 168 hours. No results for input(s): LIPASE, AMYLASE in the last 168 hours. No results for input(s): AMMONIA in the last 168 hours.  ABG    Component Value Date/Time   PHART 7.466 (H) 10/01/2019 0432   PCO2ART 46.4 10/01/2019 0432   PO2ART 92.0 10/01/2019 0432   HCO3 33.5 (H) 10/01/2019 0432   TCO2 35 (H) 10/01/2019 0432   ACIDBASEDEF 4.0 (H) 09/16/2019 2334   O2SAT 98.0 10/01/2019 0432     Coagulation Profile: No results for input(s): INR, PROTIME in the last 168 hours.  Cardiac Enzymes: No results for input(s): CKTOTAL, CKMB, CKMBINDEX, TROPONINI in the last 168 hours.  HbA1C: Hgb A1c MFr Bld  Date/Time Value Ref Range Status  09/23/2019 08:58 AM 5.5 4.8 - 5.6 % Final    Comment:    (NOTE) Pre diabetes:          5.7%-6.4% Diabetes:              >6.4% Glycemic control for   <7.0% adults with diabetes   07/24/2019 04:50 AM 6.7 (H) 4.8 - 5.6 % Final    Comment:    (NOTE) Pre diabetes:          5.7%-6.4% Diabetes:              >6.4% Glycemic control for   <7.0% adults with diabetes     CBG: Recent Labs  Lab 09/30/19 1556 09/30/19 1929 10/01/19 0006 10/01/19 0430 10/01/19 0751  GLUCAP 123* 120* 102* 86 95   This patient is critically ill with multiple organ system failure; which, requires frequent high complexity decision making, assessment, support, evaluation, and titration of therapies. This was completed through the application of advanced monitoring technologies and  extensive interpretation of multiple databases. During this encounter critical care time was devoted to patient care services described in this note for 40 minutes.  Kipp Brood, MD [facility] 10/01/2019 7:58 AM

## 2019-10-01 NOTE — Progress Notes (Signed)
RT note- Tracheostomy scheduled for change today, Dr. Lynetta Mare notified, will assist with change, no other changes.

## 2019-10-02 ENCOUNTER — Inpatient Hospital Stay (HOSPITAL_COMMUNITY): Payer: Managed Care, Other (non HMO)

## 2019-10-02 LAB — POCT I-STAT 7, (LYTES, BLD GAS, ICA,H+H)
Acid-Base Excess: 5 mmol/L — ABNORMAL HIGH (ref 0.0–2.0)
Acid-Base Excess: 5 mmol/L — ABNORMAL HIGH (ref 0.0–2.0)
Acid-Base Excess: 5 mmol/L — ABNORMAL HIGH (ref 0.0–2.0)
Acid-Base Excess: 5 mmol/L — ABNORMAL HIGH (ref 0.0–2.0)
Acid-Base Excess: 5 mmol/L — ABNORMAL HIGH (ref 0.0–2.0)
Acid-Base Excess: 5 mmol/L — ABNORMAL HIGH (ref 0.0–2.0)
Acid-Base Excess: 5 mmol/L — ABNORMAL HIGH (ref 0.0–2.0)
Acid-Base Excess: 5 mmol/L — ABNORMAL HIGH (ref 0.0–2.0)
Acid-Base Excess: 6 mmol/L — ABNORMAL HIGH (ref 0.0–2.0)
Acid-Base Excess: 6 mmol/L — ABNORMAL HIGH (ref 0.0–2.0)
Acid-Base Excess: 6 mmol/L — ABNORMAL HIGH (ref 0.0–2.0)
Acid-Base Excess: 6 mmol/L — ABNORMAL HIGH (ref 0.0–2.0)
Acid-Base Excess: 6 mmol/L — ABNORMAL HIGH (ref 0.0–2.0)
Acid-Base Excess: 6 mmol/L — ABNORMAL HIGH (ref 0.0–2.0)
Acid-Base Excess: 7 mmol/L — ABNORMAL HIGH (ref 0.0–2.0)
Acid-Base Excess: 7 mmol/L — ABNORMAL HIGH (ref 0.0–2.0)
Acid-Base Excess: 7 mmol/L — ABNORMAL HIGH (ref 0.0–2.0)
Bicarbonate: 30.9 mmol/L — ABNORMAL HIGH (ref 20.0–28.0)
Bicarbonate: 31.1 mmol/L — ABNORMAL HIGH (ref 20.0–28.0)
Bicarbonate: 31.1 mmol/L — ABNORMAL HIGH (ref 20.0–28.0)
Bicarbonate: 31.1 mmol/L — ABNORMAL HIGH (ref 20.0–28.0)
Bicarbonate: 31.1 mmol/L — ABNORMAL HIGH (ref 20.0–28.0)
Bicarbonate: 31.2 mmol/L — ABNORMAL HIGH (ref 20.0–28.0)
Bicarbonate: 31.2 mmol/L — ABNORMAL HIGH (ref 20.0–28.0)
Bicarbonate: 31.2 mmol/L — ABNORMAL HIGH (ref 20.0–28.0)
Bicarbonate: 31.7 mmol/L — ABNORMAL HIGH (ref 20.0–28.0)
Bicarbonate: 31.7 mmol/L — ABNORMAL HIGH (ref 20.0–28.0)
Bicarbonate: 31.7 mmol/L — ABNORMAL HIGH (ref 20.0–28.0)
Bicarbonate: 31.8 mmol/L — ABNORMAL HIGH (ref 20.0–28.0)
Bicarbonate: 31.9 mmol/L — ABNORMAL HIGH (ref 20.0–28.0)
Bicarbonate: 32.1 mmol/L — ABNORMAL HIGH (ref 20.0–28.0)
Bicarbonate: 32.2 mmol/L — ABNORMAL HIGH (ref 20.0–28.0)
Bicarbonate: 32.7 mmol/L — ABNORMAL HIGH (ref 20.0–28.0)
Bicarbonate: 33.2 mmol/L — ABNORMAL HIGH (ref 20.0–28.0)
Calcium, Ion: 1.25 mmol/L (ref 1.15–1.40)
Calcium, Ion: 1.26 mmol/L (ref 1.15–1.40)
Calcium, Ion: 1.26 mmol/L (ref 1.15–1.40)
Calcium, Ion: 1.26 mmol/L (ref 1.15–1.40)
Calcium, Ion: 1.27 mmol/L (ref 1.15–1.40)
Calcium, Ion: 1.27 mmol/L (ref 1.15–1.40)
Calcium, Ion: 1.27 mmol/L (ref 1.15–1.40)
Calcium, Ion: 1.28 mmol/L (ref 1.15–1.40)
Calcium, Ion: 1.28 mmol/L (ref 1.15–1.40)
Calcium, Ion: 1.28 mmol/L (ref 1.15–1.40)
Calcium, Ion: 1.28 mmol/L (ref 1.15–1.40)
Calcium, Ion: 1.28 mmol/L (ref 1.15–1.40)
Calcium, Ion: 1.29 mmol/L (ref 1.15–1.40)
Calcium, Ion: 1.3 mmol/L (ref 1.15–1.40)
Calcium, Ion: 1.31 mmol/L (ref 1.15–1.40)
Calcium, Ion: 1.31 mmol/L (ref 1.15–1.40)
Calcium, Ion: 1.31 mmol/L (ref 1.15–1.40)
HCT: 24 % — ABNORMAL LOW (ref 39.0–52.0)
HCT: 25 % — ABNORMAL LOW (ref 39.0–52.0)
HCT: 25 % — ABNORMAL LOW (ref 39.0–52.0)
HCT: 25 % — ABNORMAL LOW (ref 39.0–52.0)
HCT: 25 % — ABNORMAL LOW (ref 39.0–52.0)
HCT: 25 % — ABNORMAL LOW (ref 39.0–52.0)
HCT: 25 % — ABNORMAL LOW (ref 39.0–52.0)
HCT: 25 % — ABNORMAL LOW (ref 39.0–52.0)
HCT: 26 % — ABNORMAL LOW (ref 39.0–52.0)
HCT: 26 % — ABNORMAL LOW (ref 39.0–52.0)
HCT: 26 % — ABNORMAL LOW (ref 39.0–52.0)
HCT: 26 % — ABNORMAL LOW (ref 39.0–52.0)
HCT: 26 % — ABNORMAL LOW (ref 39.0–52.0)
HCT: 26 % — ABNORMAL LOW (ref 39.0–52.0)
HCT: 26 % — ABNORMAL LOW (ref 39.0–52.0)
HCT: 27 % — ABNORMAL LOW (ref 39.0–52.0)
HCT: 27 % — ABNORMAL LOW (ref 39.0–52.0)
Hemoglobin: 8.2 g/dL — ABNORMAL LOW (ref 13.0–17.0)
Hemoglobin: 8.5 g/dL — ABNORMAL LOW (ref 13.0–17.0)
Hemoglobin: 8.5 g/dL — ABNORMAL LOW (ref 13.0–17.0)
Hemoglobin: 8.5 g/dL — ABNORMAL LOW (ref 13.0–17.0)
Hemoglobin: 8.5 g/dL — ABNORMAL LOW (ref 13.0–17.0)
Hemoglobin: 8.5 g/dL — ABNORMAL LOW (ref 13.0–17.0)
Hemoglobin: 8.5 g/dL — ABNORMAL LOW (ref 13.0–17.0)
Hemoglobin: 8.5 g/dL — ABNORMAL LOW (ref 13.0–17.0)
Hemoglobin: 8.8 g/dL — ABNORMAL LOW (ref 13.0–17.0)
Hemoglobin: 8.8 g/dL — ABNORMAL LOW (ref 13.0–17.0)
Hemoglobin: 8.8 g/dL — ABNORMAL LOW (ref 13.0–17.0)
Hemoglobin: 8.8 g/dL — ABNORMAL LOW (ref 13.0–17.0)
Hemoglobin: 8.8 g/dL — ABNORMAL LOW (ref 13.0–17.0)
Hemoglobin: 8.8 g/dL — ABNORMAL LOW (ref 13.0–17.0)
Hemoglobin: 8.8 g/dL — ABNORMAL LOW (ref 13.0–17.0)
Hemoglobin: 9.2 g/dL — ABNORMAL LOW (ref 13.0–17.0)
Hemoglobin: 9.2 g/dL — ABNORMAL LOW (ref 13.0–17.0)
O2 Saturation: 90 %
O2 Saturation: 91 %
O2 Saturation: 91 %
O2 Saturation: 91 %
O2 Saturation: 92 %
O2 Saturation: 92 %
O2 Saturation: 93 %
O2 Saturation: 95 %
O2 Saturation: 96 %
O2 Saturation: 96 %
O2 Saturation: 97 %
O2 Saturation: 97 %
O2 Saturation: 97 %
O2 Saturation: 97 %
O2 Saturation: 97 %
O2 Saturation: 97 %
O2 Saturation: 98 %
Patient temperature: 36.7
Patient temperature: 36.8
Patient temperature: 36.8
Patient temperature: 36.8
Patient temperature: 36.8
Patient temperature: 36.8
Patient temperature: 36.8
Patient temperature: 36.8
Patient temperature: 36.8
Patient temperature: 36.8
Patient temperature: 36.8
Patient temperature: 36.9
Patient temperature: 36.9
Patient temperature: 36.9
Patient temperature: 36.9
Patient temperature: 37
Patient temperature: 37
Potassium: 4.4 mmol/L (ref 3.5–5.1)
Potassium: 4.4 mmol/L (ref 3.5–5.1)
Potassium: 4.4 mmol/L (ref 3.5–5.1)
Potassium: 4.4 mmol/L (ref 3.5–5.1)
Potassium: 4.5 mmol/L (ref 3.5–5.1)
Potassium: 4.5 mmol/L (ref 3.5–5.1)
Potassium: 4.5 mmol/L (ref 3.5–5.1)
Potassium: 4.5 mmol/L (ref 3.5–5.1)
Potassium: 4.6 mmol/L (ref 3.5–5.1)
Potassium: 4.6 mmol/L (ref 3.5–5.1)
Potassium: 4.6 mmol/L (ref 3.5–5.1)
Potassium: 4.6 mmol/L (ref 3.5–5.1)
Potassium: 4.6 mmol/L (ref 3.5–5.1)
Potassium: 4.6 mmol/L (ref 3.5–5.1)
Potassium: 4.6 mmol/L (ref 3.5–5.1)
Potassium: 4.6 mmol/L (ref 3.5–5.1)
Potassium: 4.6 mmol/L (ref 3.5–5.1)
Sodium: 144 mmol/L (ref 135–145)
Sodium: 144 mmol/L (ref 135–145)
Sodium: 145 mmol/L (ref 135–145)
Sodium: 145 mmol/L (ref 135–145)
Sodium: 146 mmol/L — ABNORMAL HIGH (ref 135–145)
Sodium: 146 mmol/L — ABNORMAL HIGH (ref 135–145)
Sodium: 146 mmol/L — ABNORMAL HIGH (ref 135–145)
Sodium: 146 mmol/L — ABNORMAL HIGH (ref 135–145)
Sodium: 146 mmol/L — ABNORMAL HIGH (ref 135–145)
Sodium: 146 mmol/L — ABNORMAL HIGH (ref 135–145)
Sodium: 147 mmol/L — ABNORMAL HIGH (ref 135–145)
Sodium: 147 mmol/L — ABNORMAL HIGH (ref 135–145)
Sodium: 147 mmol/L — ABNORMAL HIGH (ref 135–145)
Sodium: 147 mmol/L — ABNORMAL HIGH (ref 135–145)
Sodium: 147 mmol/L — ABNORMAL HIGH (ref 135–145)
Sodium: 147 mmol/L — ABNORMAL HIGH (ref 135–145)
Sodium: 147 mmol/L — ABNORMAL HIGH (ref 135–145)
TCO2: 32 mmol/L (ref 22–32)
TCO2: 32 mmol/L (ref 22–32)
TCO2: 33 mmol/L — ABNORMAL HIGH (ref 22–32)
TCO2: 33 mmol/L — ABNORMAL HIGH (ref 22–32)
TCO2: 33 mmol/L — ABNORMAL HIGH (ref 22–32)
TCO2: 33 mmol/L — ABNORMAL HIGH (ref 22–32)
TCO2: 33 mmol/L — ABNORMAL HIGH (ref 22–32)
TCO2: 33 mmol/L — ABNORMAL HIGH (ref 22–32)
TCO2: 33 mmol/L — ABNORMAL HIGH (ref 22–32)
TCO2: 33 mmol/L — ABNORMAL HIGH (ref 22–32)
TCO2: 33 mmol/L — ABNORMAL HIGH (ref 22–32)
TCO2: 33 mmol/L — ABNORMAL HIGH (ref 22–32)
TCO2: 34 mmol/L — ABNORMAL HIGH (ref 22–32)
TCO2: 34 mmol/L — ABNORMAL HIGH (ref 22–32)
TCO2: 34 mmol/L — ABNORMAL HIGH (ref 22–32)
TCO2: 34 mmol/L — ABNORMAL HIGH (ref 22–32)
TCO2: 35 mmol/L — ABNORMAL HIGH (ref 22–32)
pCO2 arterial: 42.1 mmHg (ref 32.0–48.0)
pCO2 arterial: 43.8 mmHg (ref 32.0–48.0)
pCO2 arterial: 49.7 mmHg — ABNORMAL HIGH (ref 32.0–48.0)
pCO2 arterial: 50.3 mmHg — ABNORMAL HIGH (ref 32.0–48.0)
pCO2 arterial: 50.5 mmHg — ABNORMAL HIGH (ref 32.0–48.0)
pCO2 arterial: 52.4 mmHg — ABNORMAL HIGH (ref 32.0–48.0)
pCO2 arterial: 52.4 mmHg — ABNORMAL HIGH (ref 32.0–48.0)
pCO2 arterial: 53 mmHg — ABNORMAL HIGH (ref 32.0–48.0)
pCO2 arterial: 53.2 mmHg — ABNORMAL HIGH (ref 32.0–48.0)
pCO2 arterial: 55.1 mmHg — ABNORMAL HIGH (ref 32.0–48.0)
pCO2 arterial: 56.2 mmHg — ABNORMAL HIGH (ref 32.0–48.0)
pCO2 arterial: 56.6 mmHg — ABNORMAL HIGH (ref 32.0–48.0)
pCO2 arterial: 56.8 mmHg — ABNORMAL HIGH (ref 32.0–48.0)
pCO2 arterial: 57.4 mmHg — ABNORMAL HIGH (ref 32.0–48.0)
pCO2 arterial: 57.5 mmHg — ABNORMAL HIGH (ref 32.0–48.0)
pCO2 arterial: 58.3 mmHg — ABNORMAL HIGH (ref 32.0–48.0)
pCO2 arterial: 59.4 mmHg — ABNORMAL HIGH (ref 32.0–48.0)
pH, Arterial: 7.343 — ABNORMAL LOW (ref 7.350–7.450)
pH, Arterial: 7.348 — ABNORMAL LOW (ref 7.350–7.450)
pH, Arterial: 7.349 — ABNORMAL LOW (ref 7.350–7.450)
pH, Arterial: 7.355 (ref 7.350–7.450)
pH, Arterial: 7.359 (ref 7.350–7.450)
pH, Arterial: 7.361 (ref 7.350–7.450)
pH, Arterial: 7.363 (ref 7.350–7.450)
pH, Arterial: 7.365 (ref 7.350–7.450)
pH, Arterial: 7.376 (ref 7.350–7.450)
pH, Arterial: 7.378 (ref 7.350–7.450)
pH, Arterial: 7.382 (ref 7.350–7.450)
pH, Arterial: 7.382 (ref 7.350–7.450)
pH, Arterial: 7.397 (ref 7.350–7.450)
pH, Arterial: 7.399 (ref 7.350–7.450)
pH, Arterial: 7.413 (ref 7.350–7.450)
pH, Arterial: 7.47 — ABNORMAL HIGH (ref 7.350–7.450)
pH, Arterial: 7.475 — ABNORMAL HIGH (ref 7.350–7.450)
pO2, Arterial: 100 mmHg (ref 83.0–108.0)
pO2, Arterial: 102 mmHg (ref 83.0–108.0)
pO2, Arterial: 63 mmHg — ABNORMAL LOW (ref 83.0–108.0)
pO2, Arterial: 63 mmHg — ABNORMAL LOW (ref 83.0–108.0)
pO2, Arterial: 63 mmHg — ABNORMAL LOW (ref 83.0–108.0)
pO2, Arterial: 65 mmHg — ABNORMAL LOW (ref 83.0–108.0)
pO2, Arterial: 66 mmHg — ABNORMAL LOW (ref 83.0–108.0)
pO2, Arterial: 67 mmHg — ABNORMAL LOW (ref 83.0–108.0)
pO2, Arterial: 68 mmHg — ABNORMAL LOW (ref 83.0–108.0)
pO2, Arterial: 79 mmHg — ABNORMAL LOW (ref 83.0–108.0)
pO2, Arterial: 85 mmHg (ref 83.0–108.0)
pO2, Arterial: 88 mmHg (ref 83.0–108.0)
pO2, Arterial: 88 mmHg (ref 83.0–108.0)
pO2, Arterial: 89 mmHg (ref 83.0–108.0)
pO2, Arterial: 96 mmHg (ref 83.0–108.0)
pO2, Arterial: 97 mmHg (ref 83.0–108.0)
pO2, Arterial: 99 mmHg (ref 83.0–108.0)

## 2019-10-02 LAB — BASIC METABOLIC PANEL
Anion gap: 9 (ref 5–15)
Anion gap: 4 — ABNORMAL LOW (ref 5–15)
BUN: 86 mg/dL — ABNORMAL HIGH (ref 6–20)
BUN: 93 mg/dL — ABNORMAL HIGH (ref 6–20)
CO2: 29 mmol/L (ref 22–32)
CO2: 31 mmol/L (ref 22–32)
Calcium: 8.8 mg/dL — ABNORMAL LOW (ref 8.9–10.3)
Calcium: 8.7 mg/dL — ABNORMAL LOW (ref 8.9–10.3)
Chloride: 108 mmol/L (ref 98–111)
Chloride: 109 mmol/L (ref 98–111)
Creatinine, Ser: 0.69 mg/dL (ref 0.61–1.24)
Creatinine, Ser: 0.66 mg/dL (ref 0.61–1.24)
GFR calc Af Amer: >60 mL/min (ref >60)
GFR calc Af Amer: >60 mL/min (ref >60)
GFR calc non Af Amer: >60 mL/min (ref >60)
GFR calc non Af Amer: >60 mL/min (ref >60)
Glucose, Bld: 106 mg/dL — ABNORMAL HIGH (ref 70–99)
Glucose, Bld: 114 mg/dL — ABNORMAL HIGH (ref 70–99)
Potassium: 4.6 mmol/L (ref 3.5–5.1)
Potassium: 4.6 mmol/L (ref 3.5–5.1)
Sodium: 146 mmol/L — ABNORMAL HIGH (ref 135–145)
Sodium: 144 mmol/L (ref 135–145)

## 2019-10-02 LAB — MAGNESIUM: Magnesium: 2.2 mg/dL (ref 1.7–2.4)

## 2019-10-02 LAB — CBC
HCT: 30.6 % — ABNORMAL LOW (ref 39.0–52.0)
HCT: 28 % — ABNORMAL LOW (ref 39.0–52.0)
Hemoglobin: 8.8 g/dL — ABNORMAL LOW (ref 13.0–17.0)
Hemoglobin: 8 g/dL — ABNORMAL LOW (ref 13.0–17.0)
MCH: 27.7 pg (ref 26.0–34.0)
MCH: 27.8 pg (ref 26.0–34.0)
MCHC: 28.8 g/dL — ABNORMAL LOW (ref 30.0–36.0)
MCHC: 28.6 g/dL — ABNORMAL LOW (ref 30.0–36.0)
MCV: 96.2 fL (ref 80.0–100.0)
MCV: 97.2 fL (ref 80.0–100.0)
Platelets: 130 10*3/uL — ABNORMAL LOW (ref 150–400)
Platelets: 118 10*3/uL — ABNORMAL LOW (ref 150–400)
RBC: 3.18 MIL/uL — ABNORMAL LOW (ref 4.22–5.81)
RBC: 2.88 MIL/uL — ABNORMAL LOW (ref 4.22–5.81)
RDW: 17.1 % — ABNORMAL HIGH (ref 11.5–15.5)
RDW: 17.2 % — ABNORMAL HIGH (ref 11.5–15.5)
WBC: 13.9 10*3/uL — ABNORMAL HIGH (ref 4.0–10.5)
WBC: 13.1 10*3/uL — ABNORMAL HIGH (ref 4.0–10.5)
nRBC: 0 % (ref 0.0–0.2)
nRBC: 0 % (ref 0.0–0.2)

## 2019-10-02 LAB — GLUCOSE, CAPILLARY
Glucose-Capillary: 129 mg/dL — ABNORMAL HIGH (ref 70–99)
Glucose-Capillary: 105 mg/dL — ABNORMAL HIGH (ref 70–99)
Glucose-Capillary: 156 mg/dL — ABNORMAL HIGH (ref 70–99)
Glucose-Capillary: 91 mg/dL (ref 70–99)
Glucose-Capillary: 110 mg/dL — ABNORMAL HIGH (ref 70–99)
Glucose-Capillary: 125 mg/dL — ABNORMAL HIGH (ref 70–99)
Glucose-Capillary: 115 mg/dL — ABNORMAL HIGH (ref 70–99)

## 2019-10-02 LAB — LACTATE DEHYDROGENASE: LDH: 413 U/L — ABNORMAL HIGH (ref 98–192)

## 2019-10-02 LAB — HEPARIN LEVEL (UNFRACTIONATED)
Heparin Unfractionated: 0.27 IU/mL — ABNORMAL LOW (ref 0.30–0.70)
Heparin Unfractionated: 0.3 IU/mL (ref 0.30–0.70)

## 2019-10-02 LAB — COOXEMETRY PANEL
Carboxyhemoglobin: 3.1 % — ABNORMAL HIGH (ref 0.5–1.5)
Methemoglobin: 1.2 % (ref 0.0–1.5)
O2 Saturation: 83.6 %
Total hemoglobin: 8.8 g/dL — ABNORMAL LOW (ref 12.0–16.0)

## 2019-10-02 LAB — FIBRINOGEN: Fibrinogen: 453 mg/dL (ref 210–475)

## 2019-10-02 LAB — PHOSPHORUS: Phosphorus: 3.6 mg/dL (ref 2.5–4.6)

## 2019-10-02 LAB — APTT
aPTT: 60 seconds — ABNORMAL HIGH (ref 24–36)
aPTT: 68 seconds — ABNORMAL HIGH (ref 24–36)

## 2019-10-02 LAB — LACTIC ACID, PLASMA: Lactic Acid, Venous: 1.1 mmol/L (ref 0.5–1.9)

## 2019-10-02 MED ORDER — ACETAMINOPHEN 160 MG/5ML PO SOLN
650.0000 mg | Freq: Four times a day (QID) | ORAL | Status: DC
  Administered 2019-10-02 – 2019-10-04 (×8): 650 mg via ORAL
  Filled 2019-10-02 (×8): qty 20.3

## 2019-10-02 MED ORDER — CLONIDINE HCL 0.2 MG PO TABS
0.2000 mg | ORAL_TABLET | Freq: Four times a day (QID) | ORAL | Status: DC
  Administered 2019-10-02 – 2019-10-05 (×13): 0.2 mg via ORAL
  Filled 2019-10-02 (×8): qty 1
  Filled 2019-10-02: qty 2
  Filled 2019-10-02 (×4): qty 1

## 2019-10-02 NOTE — Progress Notes (Signed)
ANTICOAGULATION CONSULT NOTE - Follow Up Consult  Pharmacy Consult for heparin Indication: ECMO  Labs: Recent Labs    10/01/19 0445 10/01/19 0912 10/01/19 1551 10/01/19 1551 10/01/19 1812 10/01/19 1812 10/02/19 0441 10/02/19 0452  HGB 8.8*   < > 8.7*   < > 9.5*   < > 9.2* 8.8*  HCT 30.5*   < > 30.2*   < > 28.0*  --  27.0* 30.6*  PLT 124*  --  126*  --   --   --   --  130*  APTT 65*  --  61*  --   --   --   --  60*  HEPARINUNFRC 0.39  --  0.39  --   --   --   --  0.27*  CREATININE 0.76  --  0.71  --   --   --   --  0.69   < > = values in this interval not displayed.    Assessment: 44yo male on ECMO.  Heparin level is subtherapeutic at 0.27 on 1500 units/hr. Hgb relatively stable at 8.8, plt 130 stable. LDH 413 (up), fibrinogen 453 stable. No overt bleeding or complications reported.  Goal of Therapy:  Heparin level 0.3-0.4 units/ml   Plan:  Increase IV heparin at current rate of 1550 units/hr. Heparin levels and CBC with routine ECMO labs. Monitor for s/sx bleeding  Antonietta Jewel, PharmD, BCCCP Clinical Pharmacist  Phone: [phone]  Please check AMION for all [place] [place] phone numbers After 10:00 PM, call [place] [phone] 10/02/2019 7:38 AM

## 2019-10-02 NOTE — CV Procedure (Signed)
   ECMO NOTE:  Indication: Respiratory failure due to COVID PNA  Initial cannulation date: 08/03/2019  ECMO type: VV ECMO (Cardio-Help)  Dual lumen  Inflow/return cannula:  1) 32 FR Crescent placed 2/2  ECMO events:  Initial cannulation 12/26 Circuit changed and trach exchanged on 1/8.   Underwent addition of a RIJ drainage cannula on 1/20 Circuit changed on 1/22 for rising LDH Had evidence of recirculation and RIJ cannula pulled back on 1/26 19 FR RIJ removed 09/06/19 Underwent change to [facility] catheter with removal of femoral lines 2/2 2 hour sweep trial 2/21 Sedation weaned off 2/22  Daily data:  Remains on VV ecmo with good flows. Sedation weaned off. Awake but not following commands. Grimacing   Flow 4.9 RPM 3600 dP 39 Pven  -91 Sweep  3 SVO2 79.9%  Labs:  Hgb 8.8 LDH 355 -> 384 -> 412 -> 413 Heparin level: 0.27 Lactic acid 1.1  Plan:  Crescent cannula working well Excellent flow and sats. Developed recurrent PNA and sepsis 2/15.  Cx with serratia. Continue meropenum today is 10/14 Sedation turned off 2/22. Awake but not following commadns Continue to wean support. Wean sweep back to 1 today Vent on minimal settings.  Can start Precedex for agitation avoid benzos/narcotics as much as possible.  Increase clonidine for HTN Follow mental status   Glori Bickers, MD  8:34 AM

## 2019-10-02 NOTE — Progress Notes (Signed)
ANTICOAGULATION CONSULT NOTE - Follow Up Consult  Pharmacy Consult for heparin Indication: ECMO  Labs: Recent Labs    10/01/19 1551 10/01/19 1812 10/02/19 0452 10/02/19 0742 10/02/19 1336 10/02/19 1336 10/02/19 1407 10/02/19 1607  HGB 8.7*   < > 8.8*   < > 8.5*   < > 8.5* 8.0*  HCT 30.2*   < > 30.6*   < > 25.0*  --  25.0* 28.0*  PLT 126*  --  130*  --   --   --   --  118*  APTT 61*  --  60*  --   --   --   --  68*  HEPARINUNFRC 0.39  --  0.27*  --   --   --   --  0.30  CREATININE 0.71  --  0.69  --   --   --   --  0.66   < > = values in this interval not displayed.    Assessment: 44yo male on ECMO.  Heparin level now therapeutic at 0.3 after slight rate increase this morning to 1550 units/hr. Hgb trended down to 8, plt down to 118. LDH 413 stable, fibrinogen 453 stable. No overt bleeding or complications reported.  Goal of Therapy:  Heparin level 0.3-0.4 units/ml   Plan:  Continue IV heparin at current rate of 1550 units/hr. Heparin levels and CBC with routine ECMO labs. Monitor for s/sx bleeding   Elicia Lamp, PharmD, BCPS Please check AMION for all [facility] contact numbers Clinical Pharmacist 10/02/2019 5:38 PM

## 2019-10-02 NOTE — Progress Notes (Signed)
Began sweep trial at 1005.  Ventilator FiO2 adjusted to 40%, Peep 10, RR 20, PC 15.  FiO2 adjusted to 35% at 1200.    Baseline ABG drawn at approx 1005.  See VS flowsheets and ABG values drawn every 30 minutes through 1505 and at 1700.  Pt tolerated sweep trial fairly well and looked relatively comfortable up until about 6.5 hours into the trial. WOB started to increase, sats crept down to 92, TV on ventilator down in the low 400's from 530's.  After 7 hours of no sweep gas, he was returned to a sweep of 1.  Post sweep ABG at 1800 was on sweep of 1.    Karsten Ro, RN

## 2019-10-02 NOTE — Progress Notes (Signed)
Patient ID: [name], male   DOB: 03-23-1976, 44 y.o.   MRN: [id number]    Advanced Heart Failure Rounding Note   Subjective:    Remains on VV ecmo.  BAL growing > 100K serratia. On meropenum day 10/14  CVP  5  Sweep 1 -> 3  On vent at 30% TVs > 450-500cc  CXR with diffuse infiltrates. Improved today. Personally reviewed  Awake on vent seems uncomfortable grimacing but won't follow commands   ABG 7.47/44/88/97%    Co-ox 84%  LDH 384 -> 412 -> 413  hgb 8.8  Sodium 146  BUN 86 Cr 0.69  ECMO parameters - see separate ecmo note   Objective:   Weight Range:  Vital Signs:   Temp:  [98.6 F (37 C)-99.3 F (37.4 C)] 98.6 F (37 C) (02/24 0000) Pulse Rate:  [80-106] 106 (02/24 0748) Resp:  [24-35] 35 (02/24 0748) BP: (127-181)/(56-69) 174/63 (02/23 1713) SpO2:  [96 %-100 %] 97 % (02/24 0748) Arterial Line BP: (147-175)/(61-76) 171/72 (02/24 0600) FiO2 (%):  [30 %-40 %] 30 % (02/24 0748) Weight:  [129.4 kg] 129.4 kg (02/24 0500) Last BM Date: 10/02/19  Weight change: Filed Weights   09/30/19 0500 10/01/19 0500 10/02/19 0500  Weight: 132.6 kg 130.5 kg 129.4 kg    Intake/Output:   Intake/Output Summary (Last 24 hours) at 10/02/2019 0757 Last data filed at 10/02/2019 0600 Gross per 24 hour  Intake 5335.22 ml  Output 4850 ml  Net 485.22 ml     Physical Exam: General:  Awake on vent/ECMO. Grimacing but not following commands HEENT: normal Neck: supple.RIJ ecmo cannula Carotids 2+ bilat; no bruits. No lymphadenopathy or thryomegaly appreciated. Cor: PMI nondisplaced. Regular rate & rhythm. No rubs, gallops or murmurs. Lungs: clear Abdomen: obese soft, nontender, nondistended. No hepatosplenomegaly. No bruits or masses. Good bowel sounds. Extremities: no cyanosis, clubbing, rash, 1-2+ edema Neuro: Awake on vent/ECMO. Grimacing but not following commands    Telemetry: Sinus 90-110s Personally reviewed   Labs: Basic Metabolic Panel: Recent Labs   Lab 09/28/19 0455 09/28/19 0512 09/29/19 0431 09/29/19 0440 09/30/19 0500 09/30/19 0508 09/30/19 1557 09/30/19 1704 10/01/19 0445 10/01/19 0912 10/01/19 1449 10/01/19 1551 10/01/19 1812 10/02/19 0441 10/02/19 0452  NA 153*   < > 152*   < > 148*   < > 146*   < > 147*   < > 148* 146* 148* 147* 146*  K 4.4   < > 4.7   < > 4.7   < > 4.8   < > 4.5   < > 4.6 4.6 4.5 4.6 4.6  CL 114*   < > 113*   < > 106  --  107  --  108  --   --  109  --   --  108  CO2 30   < > 32   < > 31  --  31  --  30  --   --  30  --   --  29  GLUCOSE 173*   < > 156*   < > 115*  --  130*  --  87  --   --  109*  --   --  106*  BUN 92*   < > 88*   < > 88*  --  96*  --  85*  --   --  94*  --   --  86*  CREATININE 0.88   < > 0.67   < > 0.78  --  0.73  --  0.76  --   --  0.71  --   --  0.69  CALCIUM 8.9   < > 9.0   < > 8.8*   < > 8.9   < > 8.9  --   --  8.9  --   --  8.8*  MG 2.2  --  2.3  --  2.2  --   --   --  2.1  --   --   --   --   --  2.2  PHOS 3.5  --  4.2  --  4.3  --   --   --  3.8  --   --   --   --   --  3.6   < > = values in this interval not displayed.    Liver Function Tests: No results for input(s): AST, ALT, ALKPHOS, BILITOT, PROT, ALBUMIN in the last 168 hours. No results for input(s): LIPASE, AMYLASE in the last 168 hours. No results for input(s): AMMONIA in the last 168 hours.  CBC: Recent Labs  Lab 09/30/19 0500 09/30/19 0508 09/30/19 1557 09/30/19 1704 10/01/19 0445 10/01/19 0912 10/01/19 1449 10/01/19 1551 10/01/19 1812 10/02/19 0441 10/02/19 0452  WBC 11.5*  --  15.7*  --  11.9*  --   --  14.1*  --   --  13.9*  HGB 8.4*   < > 9.0*   < > 8.8*   < > 8.8* 8.7* 9.5* 9.2* 8.8*  HCT 29.4*   < > 31.0*   < > 30.5*   < > 26.0* 30.2* 28.0* 27.0* 30.6*  MCV 96.4  --  95.7  --  96.8  --   --  96.2  --   --  96.2  PLT 113*  --  127*  --  124*  --   --  126*  --   --  130*   < > = values in this interval not displayed.    Cardiac Enzymes: No results for input(s): CKTOTAL, CKMB,  CKMBINDEX, TROPONINI in the last 168 hours.  BNP: BNP (last 3 results) Recent Labs    07/22/19 1039  BNP 15.3    ProBNP (last 3 results) No results for input(s): PROBNP in the last 8760 hours.    Other results:  Imaging: DG CHEST PORT 1 VIEW  Result Date: 10/01/2019 CLINICAL DATA:  Tracheostomy. EXAM: PORTABLE CHEST 1 VIEW COMPARISON:  September 30, 2019. FINDINGS: Stable cardiomediastinal silhouette. Tracheostomy tube is unchanged in position. Stable bilateral lung opacities are noted most consistent with multifocal pneumonia. No pneumothorax or significant pleural effusion is noted. Bony thorax is unremarkable. IMPRESSION: Stable bilateral lung opacities most consistent with multifocal pneumonia. Electronically Signed   By: Marijo Conception M.D.   On: 10/01/2019 09:10     Medications:     Scheduled Medications: . amiodarone  200 mg Oral Daily  . amLODipine  10 mg Oral Daily  . vitamin C  500 mg Per Tube Daily  . chlorhexidine gluconate (MEDLINE KIT)  15 mL Mouth Rinse BID  . Chlorhexidine Gluconate Cloth  6 each Topical Daily  . cloNIDine  0.2 mg Oral Q6H  . feeding supplement (OSMOLITE 1.5 CAL)  237 mL Per Tube 6 X Daily  . feeding supplement (PRO-STAT SUGAR FREE 64)  60 mL Per Tube QID  . free water  500 mL Per Tube Q6H  . hydrALAZINE  25 mg Per Tube Q8H  . insulin aspart  0-20 Units Subcutaneous Q4H  . insulin aspart  5 Units Subcutaneous Q4H  . insulin detemir  18 Units Subcutaneous BID  . ipratropium-albuterol  3 mL Nebulization Q4H  . magic mouthwash  5 mL Oral TID  . mouth rinse  15 mL Mouth Rinse 10 times per day  . montelukast  10 mg Per Tube QHS  . nutrition supplement (JUVEN)  1 packet Per Tube BID BM  . pantoprazole sodium  40 mg Per Tube BID  . propranolol  80 mg Per Tube TID  . psyllium  1 packet Oral BID  . sodium chloride flush  10-40 mL Intracatheter Q12H  . white petrolatum   Topical BID    Infusions: . sodium chloride    . sodium chloride  Stopped (09/30/19 0734)  . sodium chloride 10 mL/hr at 10/02/19 0600  . dexmedetomidine (PRECEDEX) IV infusion 0.2 mcg/kg/hr (10/02/19 0757)  . heparin 1,500 Units/hr (10/02/19 0600)  . meropenem (MERREM) IV Stopped (10/01/19 2344)    PRN Medications: Place/Maintain arterial line **AND** sodium chloride, sodium chloride, sodium chloride, acetaminophen (TYLENOL) oral liquid 160 mg/5 mL, acetaminophen, [COMPLETED] diazepam **FOLLOWED BY** diazepam, hydrALAZINE, HYDROmorphone, magnesium hydroxide, midazolam, ondansetron (ZOFRAN) IV, senna-docusate, sodium chloride, sodium chloride flush   Assessment/Plan:    1. Acute hypoxic/hypercarbic respiratory failure due to COVID PNA: Remained markedly hypoxic despite full support. Intubated 12/26. S/p bilateral CTs 12/26 for pneumothoraces.  TEE on 12/26 LVEF 70% RV ok. VV ECMO begun 12/26. Tracheostomy 12/29.  COVID ARDS at this point. ECMO circuit changed 1/8 & 1/23. Crescent cannula placed 2/2 (femoral cannuals removed) - ECMO parameters as per ECMO note - Flows look good on Crescent - Developed PNA and sepsis on 2/15.  BAL with > 100K serratia. Finished nebulized tobra. On mero. Today day 10/14 of meropenum - Off all sedation since 2/22. More awake today but still not following commands. Grimacing. Will start Precedex. Avoid benzos and narcotics if possible - Continue to watch mental status closely. If not improved  will need repeat head CT and brief HD with Renal to clean. I suspect this is just lingering sedation effects - Continue to daily sweep trials and autowean of sweep. Sweep at 1 yesterday. Now at 3. ABG looks good. Turn sweep back to 1 today - Volume status ok. Weight down. Would not give lasix unless needed as we are trying to get BUN down - LDH stable - Continue heparin. Level 0.27. Discussed dosing with PharmD personally. - Continue chest PT  2. Bilateral PTX: Due to barotrauma.  - Resolved. CTs out  3. COVID PNA: management of  COVID infection per CCM. CXR with bilateral multifocal PNA progressing to likely ARDS.  - s/p 10 days of remdesivir - 12/16, 12/2 s/p tociluzimab - 12/17 s/p convalescent plasma - Decadron at 41m/daily completed 1/11 - Completed cefepime/vanc - Off Ancef  4. ID: Empiric coverage with vancomycin/cefepime initially for possible secondary bacterial PNA, course completed.   - treated with cefepime and vanc for + cx with staph epidimidis, MSSA and serratia (sensitivities ok). - developed recurrent PNA and sepsis 2/15 - BAL with > 100K serratia. On mero.   - Developed PNA and sepsis on 2/15.  BAL with > 100K serratia. Finished nebulized tobra. On mero. Today day 10/14 of meropenum. Watch closely for worsening infection   5. Anemia: - hgb 8.  today. Transfuse as needed to keep Hgb> 8.0  7. FEN - now with G-tube.Continue TFs  8. Hypernatremia - Na 146 - Continue FW  at current dose  9. AKI - Volume status looks good. AKI and azotemia improved with IVF and FW. See discussion above - BUN 86 today  10. DTIs - WOC following. Has evolving wound on left 5th toe. No change  D/w with ECMO team (ECMO coordinator/specialists, CCM, TCTS and pharmD) at bedside on multidisciplinary rounds. Wean sedation. Continue to wean sweep. Details as above.   Length of Stay: 48  CRITICAL CARE Performed by: Glori Bickers  Total critical care time: 35 minutes  Critical care time was exclusive of separately billable procedures and treating other patients.  Critical care was necessary to treat or prevent imminent or life-threatening deterioration.  Critical care was time spent personally by me (independent of midlevel providers or residents) on the following activities: development of treatment plan with patient and/or surrogate as well as nursing, discussions with consultants, evaluation of patient's response to treatment, examination of patient, obtaining history from patient or surrogate, ordering and  performing treatments and interventions, ordering and review of laboratory studies, ordering and review of radiographic studies, pulse oximetry and re-evaluation of patient's condition.    Glori Bickers MD 10/02/2019, 7:57 AM  Advanced Heart Failure Team Pager [phone] (M-F; 7a - 4p)  Please contact [facility] for night-coverage after hours (4p -7a ) and weekends on [email or web address]

## 2019-10-02 NOTE — Progress Notes (Signed)
NAME:  [name], MRN:  [id number], DOB:  06/06/1976, LOS: 66 ADMISSION DATE:  07/22/2019, CONSULTATION DATE:  12/24 REFERRING MD:  Sloan Leiter, CHIEF COMPLAINT:  Dyspnea   Brief History   44 y/o male admitted 12/14 with COVID pneumonia leading to ARDS, pneumomediastinum.  Intubated 12/26, bilateral chest tubes placed.  Past Medical History  GERD HTN Asthma  Significant Hospital Events   12/14 admit with hypoxemic respiratory failure in setting of COVID-19 pneumonia 12/23 noted to have pneumomediastinum on chest x-ray 12/24PCCM consulted for evaluation 12/26 worsening hypoxemia, intubated 12/27 VV fem/fem ECMO cannulation 12/29 tracheostomy >> See bedside nursing event notes for full rundown of procedures after 12/27 1/5 epistaxis, nasal packing 1/30 improved cxr, improved lung compliance, TV 350s on 15PC  1/31 CXR increased infiltrates, +CFB     2/2 Converted to right IJ Crescent cannula. 2/3 PEG tube inserted at bedside 2/14 30 min sweep trial> paO2 105 (18 PEEP, 60% FiO2) pH 7.22, pCO2 87 2/20 2 hours sweep trial 2/21 1.5 hour sweep trial  Consults:  PCCM, CTS, CHF, Palliative  Procedures:  PICC 1/3 >> R radial a-line 1/20 >> R IJ ECMO cath 2/3 >> Tracheostomy 12/29 >>  Significant Diagnostic Tests:  CT chest 12/22>>extensive pneumomediastinum, multifocal patchy bilateral groundglass opacities CXR 1/25 >> improving bilateral infiltrates. ECMO cannulae in place. CXR 2/5 >> clearing bilateral infiltrates  Micro Data:  BCx2 12/14 >>negative  Sputum 12/15 >> OPF 12/28 blood > negative 12/29 > staph aureus 1/4 bronch:  1/2 acid fast smear bronch: negative 1/2 fungal cx bronch: negative1/12 BAL: serratia, staph epidermidisu 1/4 BAL > MSSA, Serratia 1/9 blood > negative 1/9 Resp culture > MSSA, serratia 1/11 serratia > negative 1/12 fungus culture > negative 1/12 BAL > serratia 1/18 BAL >> rare Serratia. 1/24 blood > negative 1/30 blood > negative 1/30  resp culture > serratia 2/1 BAL >> rare Serratia - enterococcus 2/1 aspergillus Ag > negative 2/2 resp culture > serratia  2/2 blood > negative 2/5 urine > negative 2/9 resp: serratia 2/15 BAL serratia  Antimicrobials:  Received remdesivir, steroids, actemra and convalescent plasma  5 days cefepime 12/24-12/29 Cefepime 1/4->1/13 vanc 1/4> 1/11 Ceftriaxone 1/14 > 1/27 vanc x1 1/20 Cefazolin 1/29 > 2/2 cefuroxim 2/2 vanc 2/2 Ceftriaxone x1 2/4 Meropenem 2/15>>2/21 Tobramycin inhaled 2/15>>2/21  Interim history/subjective:  Tolerated 2h sweep trial yesterday, might have tolerated longer. More awake this morning, able to follow commands. Appears to be uncomfortable from cannula site. Successful diuresis yesterday.  Objective   Blood pressure (!) 121/53, pulse 92, temperature 98.4 F (36.9 C), resp. rate (!) 25, height _0  (1.727 m), weight 129.4 kg, SpO2 97 %. CVP:  [2 mmHg-8 mmHg] 4 mmHg  Vent Mode: PCV FiO2 (%):  [30 %-40 %] 40 % Set Rate:  [20 bmp] 20 bmp PEEP:  [10 cmH20] 10 cmH20 Plateau Pressure:  [26 cmH20-28 cmH20] 26 cmH20   Intake/Output Summary (Last 24 hours) at 10/02/2019 1211 Last data filed at 10/02/2019 1100 Gross per 24 hour  Intake 4317.06 ml  Output 4600 ml  Net -282.94 ml   Filed Weights   09/30/19 0500 10/01/19 0500 10/02/19 0500  Weight: 132.6 kg 130.5 kg 129.4 kg    Examination: GEN: ill appearing obese man on vent HEENT: RIJ Crescent cannula. Tracheostomy in place with no bleeding. CV: RRR, ext warm. HS normal PULM: visible respiratory effort. Clear lungs to posterior axillary line. GI: Soft, PEG CDI EXT: Anasarca improved but still +2 edema in lower extremities NEURO:  Opens eyes to voice, tolerating sitting position with minimal assistance. Will orient to voice and turn head trying to readjust head and shoulders to pain PSYCH: RASS -1  SKIN: Pale  Vt 561m Sweep: 3L/min Flow: 5[facility] HospitalProblem [facility]    Pneumothoraces-resolved.  Assessment & Plan:   ARDS due to COVID 19, requiring mechanical ventilation and VV ECMO  - Improving, lingering issues with HCAP vs. Bronchitis and profound deconditioning. - Daily sweep trials- will consider overnight trial once able to tolerate 4h.  - Poor lung compliance and diaphragmatic weakness limiting ventilation.   AKI with azotemia - improving  Positive cumulative fluid balance Hypernatremia Severe third spacing -FW replacement - attempting to maintain neg fluid balance   Hypertension - clonidine, amlodipine and propanolol and hydralazine - clonidine increased.  Serratia pneumonia> treated Stop date 2/22  Neuro muscular weakness Appreciate PT input.  For range of motion exercises.  Bilateral lower feet deep tissue injury Wound care following  Best practice:  Pain/Anxiety/Delirium protocol (if indicated): off all sedation. Dexmedetomidine to maintain comfort for sweep trial.  VAP protocol (if indicated): Bundle in place. Respiratory support goals: Continue current ultra lung protective strategy.  Daily sweep trials. Consider extended  12-hour and possible decannulation if can tolerate an initial 4 hours. DVT prophylaxis: Heparin drip, goal 0.3-0.5 GI prophylaxis: PPI Nutrition: TF Fluid status goals:  Attempting to maintain negative fluid balance Urinary catheter: Guide hemodynamic management Glucose control: controlled with current regimen Mobility/therapy needs: Bedrest.  Daily labs: as per ECMO protocol. Code Status: Full code  Family Communication: Wife updated 2/23 Disposition: ICU  Labs   CBC: CBC Latest Ref Rng & Units 10/02/2019 10/02/2019 10/02/2019  WBC 4.0 - 10.5 K/uL - 13.9(H) -  Hemoglobin 13.0 - 17.0 g/dL 9.2(L) 8.8(L) 9.2(L)  Hematocrit 39.0 - 52.0 % 27.0(L) 30.6(L) 27.0(L)  Platelets 150 - 400 K/uL - 130(L) -     Basic Metabolic Panel: BMP Latest Ref Rng & Units 10/02/2019 10/02/2019 10/02/2019  Glucose 70 - 99  mg/dL - 106(H) -  BUN 6 - 20 mg/dL - 86(H) -  Creatinine 0.61 - 1.24 mg/dL - 0.69 -  Sodium 135 - 145 mmol/L 147(H) 146(H) 147(H)  Potassium 3.5 - 5.1 mmol/L 4.6 4.6 4.6  Chloride 98 - 111 mmol/L - 108 -  CO2 22 - 32 mmol/L - 29 -  Calcium 8.9 - 10.3 mg/dL - 8.8(L) -     GFR: Estimated Creatinine Clearance: 153.1 mL/min (by C-G formula based on SCr of 0.69 mg/dL). Recent Labs  Lab 09/29/19 0431 09/29/19 1639 09/30/19 0500 09/30/19 0500 09/30/19 1557 10/01/19 0445 10/01/19 1551 10/02/19 0452  WBC 11.8*   < > 11.5*   < > 15.7* 11.9* 14.1* 13.9*  LATICACIDVEN 1.1  --  1.2  --   --  1.2  --  1.1   < > = values in this interval not displayed.    Liver Function Tests: No results for input(s): AST, ALT, ALKPHOS, BILITOT, PROT, ALBUMIN in the last 168 hours. No results for input(s): LIPASE, AMYLASE in the last 168 hours. No results for input(s): AMMONIA in the last 168 hours.  ABG    Component Value Date/Time   PHART 7.475 (H) 10/02/2019 0742   PCO2ART 42.1 10/02/2019 0742   PO2ART 89.0 10/02/2019 0742   HCO3 31.1 (H) 10/02/2019 0742   TCO2 32 10/02/2019 0742   ACIDBASEDEF 4.0 (H) 09/16/2019 2334   O2SAT 97.0 10/02/2019 0742     Coagulation Profile: No results  for input(s): INR, PROTIME in the last 168 hours.  Cardiac Enzymes: No results for input(s): CKTOTAL, CKMB, CKMBINDEX, TROPONINI in the last 168 hours.  HbA1C: Hgb A1c MFr Bld  Date/Time Value Ref Range Status  09/23/2019 08:58 AM 5.5 4.8 - 5.6 % Final    Comment:    (NOTE) Pre diabetes:          5.7%-6.4% Diabetes:              >6.4% Glycemic control for   <7.0% adults with diabetes   07/24/2019 04:50 AM 6.7 (H) 4.8 - 5.6 % Final    Comment:    (NOTE) Pre diabetes:          5.7%-6.4% Diabetes:              >6.4% Glycemic control for   <7.0% adults with diabetes     CBG: Recent Labs  Lab 10/01/19 1549 10/01/19 2047 10/01/19 2334 10/02/19 0440 10/02/19 0751  GLUCAP 104* 96 129* 105* 156*    This patient is critically ill with multiple organ system failure; which, requires frequent high complexity decision making, assessment, support, evaluation, and titration of therapies. This was completed through the application of advanced monitoring technologies and extensive interpretation of multiple databases. During this encounter critical care time was devoted to patient care services described in this note for 40 minutes.  Kipp Brood, MD [facility] 10/02/2019 12:11 PM

## 2019-10-02 NOTE — Progress Notes (Signed)
Pharmacy Antibiotic Note  [name] is a 44 y.o. male admitted on 07/22/2019 with pneumonia.  Pharmacy has been consulted for meropenem dosing. Meropenem with more literature in ECMO than cefepime.  WBC today 13, afebrile, Scr 0.69 (CrCl >100 mL/min). LA 1.1.  overnight. Scr 0.7. No new events noted. Currently on day #10 of therapy, plan to continue per team for 14 days.   Plan: Meropenem 2 g IV every 8 hours Monitor Scr, clinical pic, cx results   Height: _0  (172.7 cm) Weight: 285 lb 4.4 oz (129.4 kg) IBW/kg (Calculated) : 68.4  Temp (24hrs), Avg:98.7 F (37.1 C), Min:98.4 F (36.9 C), Max:99 F (37.2 C)  Recent Labs  Lab 09/28/19 0455 09/28/19 1626 09/29/19 0431 09/29/19 1639 09/30/19 0500 09/30/19 1557 10/01/19 0445 10/01/19 1551 10/02/19 0452  WBC 12.0*   < > 11.8*   < > 11.5* 15.7* 11.9* 14.1* 13.9*  CREATININE 0.88   < > 0.67   < > 0.78 0.73 0.76 0.71 0.69  LATICACIDVEN 1.1  --  1.1  --  1.2  --  1.2  --  1.1   < > = values in this interval not displayed.    Estimated Creatinine Clearance: 153.1 mL/min (by C-G formula based on SCr of 0.69 mg/dL).    No Known Allergies  Antimicrobials this admission: Remdesivir 12/14>>12/23 Actemra x1 12/16; 12/22 Plasma 12/17 Vanco 12/26 >>12/28, 1/4>>1/13, 2/15>>2/17 Cefepime 12/26 >>12/30, 1/4>>1/13 Meropenem 2/15>> Tobi 2/16>>2/22 Ctx 1/15>> (nose packing)1/28 restarted for 1 dose when changed foley 2/5 Ancef nasal packing 1/28>2/3  Dose adjustments this admission: N/A  Microbiology results: 12/14 BCx: ngF 12/14 sputum: Normal respiratory flora  12/25 MRSA PCR neg  12/28 trach: few MSSA 12/28 BCx: ngF 1/2 bronch: >100 k serratia marcescens (R cefazolin), >100 k staph epidermidis (S vanc, tetra) 1/4 BAL: MSSA, serratia marcescens (R cefazolin) 1/9 sputum: serratia/staph 1/9 bldx2 -ng 1/11 BAL: serratia 1/12 BAL: serratia 1/18 BAL: serratia - S cefepime 1/24 BCx x 2: ngF 1/30 Bcx: ngF 1/30 TA: mod  serratia (R cefazolin and CTX) 2/1 BAL: 30k serratia (R- cefazolin), 10k enterococcus 2/2 Sputum: few GNR, rare GPRs 2/2 Bcx x2: ngF 2/2 UCx: ngF 2/5 UCx: ngF 2/9 BAL: mod serratia, rare MSSA 2/15 Bcx x2: ng 2/15 BAL: >100k serratia - only R cefazolin  Thank you for allowing pharmacy to be a part of this patient's care.  Antonietta Jewel, PharmD, BCCCP Clinical Pharmacist  Phone: [phone]  Please check AMION for all [place] phone numbers After 10:00 PM, call [place] [phone] 10/02/2019 3:31 PM

## 2019-10-03 ENCOUNTER — Inpatient Hospital Stay (HOSPITAL_COMMUNITY): Payer: Managed Care, Other (non HMO)

## 2019-10-03 LAB — POCT I-STAT 7, (LYTES, BLD GAS, ICA,H+H)
Acid-Base Excess: 8 mmol/L — ABNORMAL HIGH (ref 0.0–2.0)
Acid-Base Excess: 6 mmol/L — ABNORMAL HIGH (ref 0.0–2.0)
Acid-Base Excess: 5 mmol/L — ABNORMAL HIGH (ref 0.0–2.0)
Acid-Base Excess: 5 mmol/L — ABNORMAL HIGH (ref 0.0–2.0)
Acid-Base Excess: 5 mmol/L — ABNORMAL HIGH (ref 0.0–2.0)
Acid-Base Excess: 7 mmol/L — ABNORMAL HIGH (ref 0.0–2.0)
Acid-Base Excess: 6 mmol/L — ABNORMAL HIGH (ref 0.0–2.0)
Acid-Base Excess: 4 mmol/L — ABNORMAL HIGH (ref 0.0–2.0)
Acid-Base Excess: 4 mmol/L — ABNORMAL HIGH (ref 0.0–2.0)
Acid-Base Excess: 5 mmol/L — ABNORMAL HIGH (ref 0.0–2.0)
Bicarbonate: 34.4 mmol/L — ABNORMAL HIGH (ref 20.0–28.0)
Bicarbonate: 31.3 mmol/L — ABNORMAL HIGH (ref 20.0–28.0)
Bicarbonate: 31 mmol/L — ABNORMAL HIGH (ref 20.0–28.0)
Bicarbonate: 31.1 mmol/L — ABNORMAL HIGH (ref 20.0–28.0)
Bicarbonate: 31 mmol/L — ABNORMAL HIGH (ref 20.0–28.0)
Bicarbonate: 32.3 mmol/L — ABNORMAL HIGH (ref 20.0–28.0)
Bicarbonate: 32.1 mmol/L — ABNORMAL HIGH (ref 20.0–28.0)
Bicarbonate: 31 mmol/L — ABNORMAL HIGH (ref 20.0–28.0)
Bicarbonate: 32 mmol/L — ABNORMAL HIGH (ref 20.0–28.0)
Bicarbonate: 31.2 mmol/L — ABNORMAL HIGH (ref 20.0–28.0)
Calcium, Ion: 1.3 mmol/L (ref 1.15–1.40)
Calcium, Ion: 1.24 mmol/L (ref 1.15–1.40)
Calcium, Ion: 1.27 mmol/L (ref 1.15–1.40)
Calcium, Ion: 1.27 mmol/L (ref 1.15–1.40)
Calcium, Ion: 1.26 mmol/L (ref 1.15–1.40)
Calcium, Ion: 1.25 mmol/L (ref 1.15–1.40)
Calcium, Ion: 1.28 mmol/L (ref 1.15–1.40)
Calcium, Ion: 1.26 mmol/L (ref 1.15–1.40)
Calcium, Ion: 1.25 mmol/L (ref 1.15–1.40)
Calcium, Ion: 1.25 mmol/L (ref 1.15–1.40)
HCT: 24 % — ABNORMAL LOW (ref 39.0–52.0)
HCT: 27 % — ABNORMAL LOW (ref 39.0–52.0)
HCT: 28 % — ABNORMAL LOW (ref 39.0–52.0)
HCT: 30 % — ABNORMAL LOW (ref 39.0–52.0)
HCT: 26 % — ABNORMAL LOW (ref 39.0–52.0)
HCT: 25 % — ABNORMAL LOW (ref 39.0–52.0)
HCT: 27 % — ABNORMAL LOW (ref 39.0–52.0)
HCT: 29 % — ABNORMAL LOW (ref 39.0–52.0)
HCT: 26 % — ABNORMAL LOW (ref 39.0–52.0)
HCT: 25 % — ABNORMAL LOW (ref 39.0–52.0)
Hemoglobin: 8.2 g/dL — ABNORMAL LOW (ref 13.0–17.0)
Hemoglobin: 9.2 g/dL — ABNORMAL LOW (ref 13.0–17.0)
Hemoglobin: 10.2 g/dL — ABNORMAL LOW (ref 13.0–17.0)
Hemoglobin: 9.5 g/dL — ABNORMAL LOW (ref 13.0–17.0)
Hemoglobin: 8.8 g/dL — ABNORMAL LOW (ref 13.0–17.0)
Hemoglobin: 8.5 g/dL — ABNORMAL LOW (ref 13.0–17.0)
Hemoglobin: 9.2 g/dL — ABNORMAL LOW (ref 13.0–17.0)
Hemoglobin: 9.9 g/dL — ABNORMAL LOW (ref 13.0–17.0)
Hemoglobin: 8.8 g/dL — ABNORMAL LOW (ref 13.0–17.0)
Hemoglobin: 8.5 g/dL — ABNORMAL LOW (ref 13.0–17.0)
O2 Saturation: 97 %
O2 Saturation: 97 %
O2 Saturation: 88 %
O2 Saturation: 91 %
O2 Saturation: 98 %
O2 Saturation: 95 %
O2 Saturation: 94 %
O2 Saturation: 93 %
O2 Saturation: 93 %
O2 Saturation: 99 %
Patient temperature: 37
Patient temperature: 36.8
Patient temperature: 36.9
Patient temperature: 37
Patient temperature: 37
Patient temperature: 37
Patient temperature: 37
Patient temperature: 37
Patient temperature: 37
Patient temperature: 37.1
Potassium: 4.5 mmol/L (ref 3.5–5.1)
Potassium: 4.8 mmol/L (ref 3.5–5.1)
Potassium: 4.6 mmol/L (ref 3.5–5.1)
Potassium: 4.7 mmol/L (ref 3.5–5.1)
Potassium: 4.8 mmol/L (ref 3.5–5.1)
Potassium: 4.7 mmol/L (ref 3.5–5.1)
Potassium: 4.7 mmol/L (ref 3.5–5.1)
Potassium: 4.8 mmol/L (ref 3.5–5.1)
Potassium: 4.6 mmol/L (ref 3.5–5.1)
Potassium: 4.5 mmol/L (ref 3.5–5.1)
Sodium: 145 mmol/L (ref 135–145)
Sodium: 145 mmol/L (ref 135–145)
Sodium: 143 mmol/L (ref 135–145)
Sodium: 144 mmol/L (ref 135–145)
Sodium: 144 mmol/L (ref 135–145)
Sodium: 143 mmol/L (ref 135–145)
Sodium: 145 mmol/L (ref 135–145)
Sodium: 144 mmol/L (ref 135–145)
Sodium: 143 mmol/L (ref 135–145)
Sodium: 144 mmol/L (ref 135–145)
TCO2: 36 mmol/L — ABNORMAL HIGH (ref 22–32)
TCO2: 33 mmol/L — ABNORMAL HIGH (ref 22–32)
TCO2: 33 mmol/L — ABNORMAL HIGH (ref 22–32)
TCO2: 33 mmol/L — ABNORMAL HIGH (ref 22–32)
TCO2: 33 mmol/L — ABNORMAL HIGH (ref 22–32)
TCO2: 34 mmol/L — ABNORMAL HIGH (ref 22–32)
TCO2: 34 mmol/L — ABNORMAL HIGH (ref 22–32)
TCO2: 33 mmol/L — ABNORMAL HIGH (ref 22–32)
TCO2: 34 mmol/L — ABNORMAL HIGH (ref 22–32)
TCO2: 33 mmol/L — ABNORMAL HIGH (ref 22–32)
pCO2 arterial: 58.9 mmHg — ABNORMAL HIGH (ref 32.0–48.0)
pCO2 arterial: 45.4 mmHg (ref 32.0–48.0)
pCO2 arterial: 54.4 mmHg — ABNORMAL HIGH (ref 32.0–48.0)
pCO2 arterial: 55.9 mmHg — ABNORMAL HIGH (ref 32.0–48.0)
pCO2 arterial: 53.5 mmHg — ABNORMAL HIGH (ref 32.0–48.0)
pCO2 arterial: 50.3 mmHg — ABNORMAL HIGH (ref 32.0–48.0)
pCO2 arterial: 56 mmHg — ABNORMAL HIGH (ref 32.0–48.0)
pCO2 arterial: 57.7 mmHg — ABNORMAL HIGH (ref 32.0–48.0)
pCO2 arterial: 65.8 mmHg (ref 32.0–48.0)
pCO2 arterial: 57.8 mmHg — ABNORMAL HIGH (ref 32.0–48.0)
pH, Arterial: 7.375 (ref 7.350–7.450)
pH, Arterial: 7.446 (ref 7.350–7.450)
pH, Arterial: 7.352 (ref 7.350–7.450)
pH, Arterial: 7.364 (ref 7.350–7.450)
pH, Arterial: 7.371 (ref 7.350–7.450)
pH, Arterial: 7.415 (ref 7.350–7.450)
pH, Arterial: 7.366 (ref 7.350–7.450)
pH, Arterial: 7.338 — ABNORMAL LOW (ref 7.350–7.450)
pH, Arterial: 7.296 — ABNORMAL LOW (ref 7.350–7.450)
pH, Arterial: 7.341 — ABNORMAL LOW (ref 7.350–7.450)
pO2, Arterial: 92 mmHg (ref 83.0–108.0)
pO2, Arterial: 89 mmHg (ref 83.0–108.0)
pO2, Arterial: 59 mmHg — ABNORMAL LOW (ref 83.0–108.0)
pO2, Arterial: 64 mmHg — ABNORMAL LOW (ref 83.0–108.0)
pO2, Arterial: 110 mmHg — ABNORMAL HIGH (ref 83.0–108.0)
pO2, Arterial: 77 mmHg — ABNORMAL LOW (ref 83.0–108.0)
pO2, Arterial: 75 mmHg — ABNORMAL LOW (ref 83.0–108.0)
pO2, Arterial: 74 mmHg — ABNORMAL LOW (ref 83.0–108.0)
pO2, Arterial: 75 mmHg — ABNORMAL LOW (ref 83.0–108.0)
pO2, Arterial: 153 mmHg — ABNORMAL HIGH (ref 83.0–108.0)

## 2019-10-03 LAB — BPAM RBC
Blood Product Expiration Date: 202103212359
Blood Product Expiration Date: 202103232359
Blood Product Expiration Date: 202103232359
Blood Product Expiration Date: 202103242359
Unit Type and Rh: 5100
Unit Type and Rh: 5100
Unit Type and Rh: 5100
Unit Type and Rh: 5100

## 2019-10-03 LAB — CBC
HCT: 26.1 % — ABNORMAL LOW (ref 39.0–52.0)
HCT: 28.5 % — ABNORMAL LOW (ref 39.0–52.0)
Hemoglobin: 7.4 g/dL — ABNORMAL LOW (ref 13.0–17.0)
Hemoglobin: 8.3 g/dL — ABNORMAL LOW (ref 13.0–17.0)
MCH: 27.2 pg (ref 26.0–34.0)
MCH: 27.3 pg (ref 26.0–34.0)
MCHC: 28.4 g/dL — ABNORMAL LOW (ref 30.0–36.0)
MCHC: 29.1 g/dL — ABNORMAL LOW (ref 30.0–36.0)
MCV: 96 fL (ref 80.0–100.0)
MCV: 93.8 fL (ref 80.0–100.0)
Platelets: 98 10*3/uL — ABNORMAL LOW (ref 150–400)
Platelets: 100 10*3/uL — ABNORMAL LOW (ref 150–400)
RBC: 2.72 MIL/uL — ABNORMAL LOW (ref 4.22–5.81)
RBC: 3.04 MIL/uL — ABNORMAL LOW (ref 4.22–5.81)
RDW: 17.2 % — ABNORMAL HIGH (ref 11.5–15.5)
RDW: 17.5 % — ABNORMAL HIGH (ref 11.5–15.5)
WBC: 8.6 10*3/uL (ref 4.0–10.5)
WBC: 10.9 10*3/uL — ABNORMAL HIGH (ref 4.0–10.5)
nRBC: 0 % (ref 0.0–0.2)
nRBC: 0.2 % (ref 0.0–0.2)

## 2019-10-03 LAB — BASIC METABOLIC PANEL
Anion gap: 7 (ref 5–15)
Anion gap: 7 (ref 5–15)
BUN: 86 mg/dL — ABNORMAL HIGH (ref 6–20)
BUN: 92 mg/dL — ABNORMAL HIGH (ref 6–20)
CO2: 30 mmol/L (ref 22–32)
CO2: 30 mmol/L (ref 22–32)
Calcium: 8.7 mg/dL — ABNORMAL LOW (ref 8.9–10.3)
Calcium: 8.7 mg/dL — ABNORMAL LOW (ref 8.9–10.3)
Chloride: 108 mmol/L (ref 98–111)
Chloride: 106 mmol/L (ref 98–111)
Creatinine, Ser: 0.69 mg/dL (ref 0.61–1.24)
Creatinine, Ser: 0.72 mg/dL (ref 0.61–1.24)
GFR calc Af Amer: >60 mL/min (ref >60)
GFR calc Af Amer: >60 mL/min (ref >60)
GFR calc non Af Amer: >60 mL/min (ref >60)
GFR calc non Af Amer: >60 mL/min (ref >60)
Glucose, Bld: 95 mg/dL (ref 70–99)
Glucose, Bld: 60 mg/dL — ABNORMAL LOW (ref 70–99)
Potassium: 4.6 mmol/L (ref 3.5–5.1)
Potassium: 4.7 mmol/L (ref 3.5–5.1)
Sodium: 145 mmol/L (ref 135–145)
Sodium: 143 mmol/L (ref 135–145)

## 2019-10-03 LAB — MAGNESIUM: Magnesium: 2.2 mg/dL (ref 1.7–2.4)

## 2019-10-03 LAB — TYPE AND SCREEN
ABO/RH(D): O POS
Antibody Screen: NEGATIVE
Unit division: 0
Unit division: 0
Unit division: 0
Unit division: 0

## 2019-10-03 LAB — APTT
aPTT: 77 seconds — ABNORMAL HIGH (ref 24–36)
aPTT: 65 seconds — ABNORMAL HIGH (ref 24–36)

## 2019-10-03 LAB — GLUCOSE, CAPILLARY
Glucose-Capillary: 109 mg/dL — ABNORMAL HIGH (ref 70–99)
Glucose-Capillary: 116 mg/dL — ABNORMAL HIGH (ref 70–99)
Glucose-Capillary: 177 mg/dL — ABNORMAL HIGH (ref 70–99)
Glucose-Capillary: 58 mg/dL — ABNORMAL LOW (ref 70–99)
Glucose-Capillary: 81 mg/dL (ref 70–99)
Glucose-Capillary: 84 mg/dL (ref 70–99)
Glucose-Capillary: 90 mg/dL (ref 70–99)

## 2019-10-03 LAB — PHOSPHORUS: Phosphorus: 3.8 mg/dL (ref 2.5–4.6)

## 2019-10-03 LAB — LACTATE DEHYDROGENASE: LDH: 382 U/L — ABNORMAL HIGH (ref 98–192)

## 2019-10-03 LAB — FIBRINOGEN: Fibrinogen: 373 mg/dL (ref 210–475)

## 2019-10-03 LAB — HEPARIN LEVEL (UNFRACTIONATED)
Heparin Unfractionated: 0.3 IU/mL (ref 0.30–0.70)
Heparin Unfractionated: 0.25 IU/mL — ABNORMAL LOW (ref 0.30–0.70)

## 2019-10-03 LAB — LACTIC ACID, PLASMA: Lactic Acid, Venous: 1.1 mmol/L (ref 0.5–1.9)

## 2019-10-03 LAB — PREPARE RBC (CROSSMATCH)

## 2019-10-03 LAB — COOXEMETRY PANEL
Carboxyhemoglobin: 3.2 % — ABNORMAL HIGH (ref 0.5–1.5)
Methemoglobin: 1.1 % (ref 0.0–1.5)
O2 Saturation: 81.2 %
Total hemoglobin: 10.5 g/dL — ABNORMAL LOW (ref 12.0–16.0)

## 2019-10-03 MED ORDER — GERHARDT'S BUTT CREAM
TOPICAL_CREAM | Freq: Four times a day (QID) | CUTANEOUS | Status: DC
  Administered 2019-10-05 – 2019-10-27 (×20): 1 via TOPICAL
  Filled 2019-10-03 (×6): qty 1

## 2019-10-03 MED ORDER — DEXTROSE 50 % IV SOLN
INTRAVENOUS | Status: AC
  Administered 2019-10-03: 50 mL
  Filled 2019-10-03: qty 50

## 2019-10-03 MED ORDER — QUETIAPINE FUMARATE 50 MG PO TABS
50.0000 mg | ORAL_TABLET | Freq: Every day | ORAL | Status: DC
  Administered 2019-10-03 – 2019-10-16 (×13): 50 mg
  Filled 2019-10-03 (×13): qty 1

## 2019-10-03 MED ORDER — FREE WATER
300.0000 mL | Freq: Four times a day (QID) | Status: DC
  Administered 2019-10-03 – 2019-10-17 (×49): 300 mL

## 2019-10-03 MED ORDER — SODIUM CHLORIDE 0.9% IV SOLUTION: Freq: Once | INTRAVENOUS | Status: DC

## 2019-10-03 NOTE — Progress Notes (Signed)
ECMO Specialist Goal for the shift is to establish and maintain good ventilation and optimize oxygenation via utilization of ECMO therapy using appropriate flows and settings to meet systemic and myocardial oxygen demands to assure adequate tissue perfusion.

## 2019-10-03 NOTE — Progress Notes (Signed)
ANTICOAGULATION CONSULT NOTE - Follow Up Consult  Pharmacy Consult for heparin Indication: ECMO  Labs: Recent Labs    10/02/19 1607 10/02/19 1705 10/03/19 0315 10/03/19 0417 10/03/19 1002 10/03/19 1002 10/03/19 1131 10/03/19 1619  HGB 8.0*   < > 7.4*   < > 10.2*   < > 8.8* 8.3*  HCT 28.0*   < > 26.1*   < > 30.0*  --  26.0* 28.5*  PLT 118*  --  98*  --   --   --   --  100*  APTT 68*  --  77*  --   --   --   --  65*  HEPARINUNFRC 0.30  --  0.30  --   --   --   --  0.25*  CREATININE 0.66  --  0.69  --   --   --   --  0.72   < > = values in this interval not displayed.    Assessment: 44yo male on ECMO.  Heparin level now slightly subtherapeutic at 0.25 on 1550 units/hr. Hgb stable at 8.3 today, plt down to 100 - received 1 PRBC on 2/25. LDH 373 (down), fibrinogen 382 (stable). Minimal bleeding/oozing from PICC site noted earlier today but not noted to be worsening and no issues with infusion per discussion with RN.  Goal of Therapy:  Heparin level 0.3-0.4 units/ml   Plan:  Increase IV heparin to 1600 units/hr - conservative increase with PICC line site oozing and lower heparin level goal Heparin levels and CBC with routine ECMO labs Monitor for s/sx bleeding   Elicia Lamp, PharmD, BCPS Please check AMION for all [place] contact numbers Clinical Pharmacist 10/03/2019 5:38 PM

## 2019-10-03 NOTE — Consult Note (Addendum)
[facility] Nurse wound follow up Pt in 2H04 seen  Wound type: Posterior neck Measurement: 0.5cm x 0.5cm Wound bed: small, open area mostly pink w/ no drainage Drainage (amount, consistency, odor) none Periwound: pink Dressing procedure/placement/frequency: Continue to treat with Xeroform and foam dressing   [facility] Nurse wound follow up Wound type: Bilateral unstageable wounds on lateral/bottom of feet Measurement: Right foot 19cm x 6cm x 0cm                          Left foot  21cm x 5cm x 0cm                                    Wound beds: eschar dry, black and stable Drainage (amount, consistency, odor) none Periwound: intact Dressing procedure/placement/frequency: Paint with iodine daily.  [place] Nurse wound follow up Wound type: Deep tissue pressure injuries on the heels Measurement: lt heel measuring 2cm x 3cm (inner aspect)   rt heel measuring 2.5cm x 3cm (outer aspect) Wound bed: dark maroon blood-filled blisters Drainage (amount, consistency, odor) none Periwound: intact Dressing procedure/placement/frequency: cover with foam dressing which can remain in place for 3 days.   [place] Nurse wound follow up Wound type: Linear fissue in gluteal cleft (new discovery post removal of recto-seal)  Measurement: 3cm x 0.2cm Wound bed: pink Drainage (amount, consistency, odor) none Periwound: intact Dressing procedure/placement/frequency: Gerhardt butt cream- apply to gluteal cleft   Alphonzo Lemmings, BSN, RN-BC, WTA-C, OCA

## 2019-10-03 NOTE — Evaluation (Signed)
Physical Therapy Evaluation Patient Details Name: [name] MRN: [id number] DOB: 1976-07-20 Today's Date: 10/03/2019   History of Present Illness  44 y/o M admitted 12/14 with COVID pneumonia causing acute hypoxemic respiratory failure.   Developed pneumomediastinum 12/23 with concern for possible bilateral small pneumothoraces on 12/24. Pt intubated on 12/26 and started on ECMO on 12/27. Tracheostomy on 12/29 with bleeding from trach an dother sites on 12/31. Bronch on 1/11 and 1/15. Slowly weaning sedation 2/5. Continuing to wean sedation and starting sweep trials to wean ECMO  Clinical Impression  Pt presents to PT with deficits in functional mobility, cognition, strength, power, endurance, ROM, gait. Pt is able to follow motor commands for facial movements at this time and remains alert for session, nodding head appropriately to yes/no questions intermittently. Pt noted to have spontaneous movement of BUE during session, none of LE, but is unable to follow commands with extremities at this time. Pt will benefit from continued acute PT POC to provide continued manual therapy and therapeutic exercise, progressing to functional mobility as the patient continues to become more alert and better able to participate. Pt may also benefit from the use of modalities including NMES to activate musculature during times of exercise or mobility.    Follow Up Recommendations LTACH    Equipment Recommendations  (defer to post-acute)    Recommendations for Other Services       Precautions / Restrictions Precautions Precautions: Fall;Other (comment) Precaution Comments: ECMO R IJ, Vent to trach, G tube Restrictions Weight Bearing Restrictions: No      Mobility  Bed Mobility Overal bed mobility: Needs Assistance Bed Mobility: Rolling Rolling: Total assist         General bed mobility comments: pt rolled to right to assist in turning  Transfers                     Ambulation/Gait                Stairs            Wheelchair Mobility    Modified Rankin (Stroke Patients Only)       Balance                                             Pertinent Vitals/Pain Pain Assessment: Faces Faces Pain Scale: Hurts even more Pain Location: Limbs with PROM Pain Descriptors / Indicators: Grimacing Pain Intervention(s): Limited activity within patient's tolerance;Monitored during session    Home Living Family/patient expects to be discharged to:: Private residence Living Arrangements: Spouse/significant other Available Help at Discharge: Family Type of Home: House Home Access: Stairs to enter Entrance Stairs-Rails: Psychiatric nurse of Steps: 4 Home Layout: One level Home Equipment: None Additional Comments: history obtained from chart as pt paralyzed and sedation being weaned    Prior Function Level of Independence: Independent         Comments: Pt independent in ADLs, IADLs, and mobility. Pt drives and works at [employer]. Pt does not use oxygen at home.     Hand Dominance        Extremity/Trunk Assessment   Upper Extremity Assessment Upper Extremity Assessment: RUE deficits/detail;LUE deficits/detail RUE Deficits / Details: shoulder flexion and abduction limited to 90 degrees passively due to ECMO lines, otherwise ROM WFL passively. no AROM noted LUE Deficits / Details: L shoulder  flexion limited to 75 degrees passively due to pain, elbow WFL, wrist extension limited by ~10 degrees, finger ROM WFL    Lower Extremity Assessment Lower Extremity Assessment: RLE deficits/detail;LLE deficits/detail RLE Deficits / Details: ankle DF limited to -5 degrees bilaterally, knee flexion limited to ~90 degrees, hip flexion limited to ~50 degrees due to pain. Knee extension, abduction and adduction WFL bilaterally LLE Deficits / Details: ankle DF limited to -5 degrees bilaterally, knee flexion  limited to ~90 degrees, hip flexion limited to ~50 degrees due to pain. Knee extension, abduction and adduction WFL bilaterally    Cervical / Trunk Assessment Cervical / Trunk Assessment: Other exceptions Cervical / Trunk Exceptions: morbid obesity  Communication   Communication: Tracheostomy  Cognition Arousal/Alertness: Awake/alert Behavior During Therapy: Flat affect Overall Cognitive Status: Difficult to assess                                 General Comments: pt follows commands to open eyes, blink, attempts to smile on command. Pt with no evidence of following motor command with extremities at this time, although what seems like some purposeful movement is noted in LUE      General Comments General comments (skin integrity, edema, etc.): pt trach to vent, 30% FiO2, 10 PEEP, 20 RR, on ECMO. VSS during manual therapy    Exercises General Exercises - Upper Extremity Shoulder Flexion: Both;15 reps;PROM Shoulder ABduction: PROM;Both;15 reps Elbow Flexion: PROM;Both;15 reps Elbow Extension: PROM;Both;15 reps Wrist Flexion: PROM;Both;15 reps Wrist Extension: PROM;Both;15 reps Digit Composite Flexion: PROM;Both;15 reps Composite Extension: PROM;Both;15 reps Other Exercises Other Exercises: ankle PF/DF, knee flexion/extension, hip flexion/extension, hip abduction/adduction 15 reps PROM BLE   Assessment/Plan    PT Assessment Patient needs continued PT services  PT Problem List Decreased strength;Decreased range of motion;Decreased activity tolerance;Decreased balance;Decreased mobility;Decreased knowledge of use of DME;Cardiopulmonary status limiting activity;Decreased knowledge of precautions;Decreased safety awareness;Impaired tone;Decreased skin integrity;Pain       PT Treatment Interventions Gait training;DME instruction;Functional mobility training;Therapeutic activities;Therapeutic exercise;Balance training;Neuromuscular re-education;Cognitive  remediation;Patient/family education;Wheelchair mobility training;Manual techniques;Modalities    PT Goals (Current goals can be found in the Care Plan section)  Acute Rehab PT Goals Patient Stated Goal: pt unable to state, to begin performing purposeful AROM with all extremities and following commands with all extremities PT Goal Formulation: Patient unable to participate in goal setting Time For Goal Achievement: 10/17/19 Potential to Achieve Goals: Fair    Frequency Min 2X/week(trial for 2 weeks due to improving ability to follow command)   Barriers to discharge        Co-evaluation               AM-PAC PT "6 Clicks" Mobility  Outcome Measure Help needed turning from your back to your side while in a flat bed without using bedrails?: Total Help needed moving from lying on your back to sitting on the side of a flat bed without using bedrails?: Total Help needed moving to and from a bed to a chair (including a wheelchair)?: Total Help needed standing up from a chair using your arms (e.g., wheelchair or bedside chair)?: Total Help needed to walk in hospital room?: Total Help needed climbing 3-5 steps with a railing? : Total 6 Click Score: 6    End of Session Equipment Utilized During Treatment: Oxygen;Other (comment)(ECMO) Activity Tolerance: Patient tolerated treatment well Patient left: in bed;with call bell/phone within reach;with nursing/sitter in room Nurse Communication: Mobility status PT  Visit Diagnosis: Muscle weakness (generalized) (M62.81);Other symptoms and signs involving the nervous system (R29.898);Other abnormalities of gait and mobility (R26.89)    Time: 3254-9826 PT Time Calculation (min) (ACUTE ONLY): 34 min   Charges:   PT Evaluation $PT Eval High Complexity: 1 High PT Treatments $Therapeutic Exercise: 8-22 mins        Zenaida Niece, PT, DPT Acute Rehabilitation Pager: [phone]   Zenaida Niece 10/03/2019, 5:35 PM

## 2019-10-03 NOTE — Progress Notes (Signed)
NAME:  [name], MRN:  [id number], DOB:  [birth year], LOS: 72 ADMISSION DATE:  07/22/2019, CONSULTATION DATE:  12/24 REFERRING MD:  Sloan Leiter, CHIEF COMPLAINT:  Dyspnea   Brief History   44 y/o male admitted 12/14 with COVID pneumonia leading to ARDS, pneumomediastinum.  Intubated 12/26, bilateral chest tubes placed.  Past Medical History  GERD HTN Asthma  Significant Hospital Events   12/14 admit with hypoxemic respiratory failure in setting of COVID-19 pneumonia 12/23 noted to have pneumomediastinum on chest x-ray 12/24PCCM consulted for evaluation 12/26 worsening hypoxemia, intubated 12/27 VV fem/fem ECMO cannulation 12/29 tracheostomy >> See bedside nursing event notes for full rundown of procedures after 12/27 1/5 epistaxis, nasal packing 1/30 improved cxr, improved lung compliance, TV 350s on 15PC  1/31 CXR increased infiltrates, +CFB     2/2 Converted to right IJ Crescent cannula. 2/3 PEG tube inserted at bedside 2/14 30 min sweep trial> paO2 105 (18 PEEP, 60% FiO2) pH 7.22, pCO2 87 2/20 2 hours sweep trial 2/21 1.5 hour sweep trial 2/24 7-hour sweep trial.  Increasingly awake.  Consults:  PCCM, CTS, CHF, Palliative  Procedures:  PICC 1/3 >> R radial a-line 1/20 >> R IJ ECMO cath 2/3 >> Tracheostomy 12/29 >>  Significant Diagnostic Tests:  CT chest 12/22>>extensive pneumomediastinum, multifocal patchy bilateral groundglass opacities CXR 1/25 >> improving bilateral infiltrates. ECMO cannulae in place. CXR 2/5 >> clearing bilateral infiltrates  Micro Data:  BCx2 12/14 >>negative  Sputum 12/15 >> OPF 12/28 blood > negative 12/29 > staph aureus 1/4 bronch:  1/2 acid fast smear bronch: negative 1/2 fungal cx bronch: negative1/12 BAL: serratia, staph epidermidisu 1/4 BAL > MSSA, Serratia 1/9 blood > negative 1/9 Resp culture > MSSA, serratia 1/11 serratia > negative 1/12 fungus culture > negative 1/12 BAL > serratia 1/18 BAL >> rare  Serratia. 1/24 blood > negative 1/30 blood > negative 1/30 resp culture > serratia 2/1 BAL >> rare Serratia - enterococcus 2/1 aspergillus Ag > negative 2/2 resp culture > serratia  2/2 blood > negative 2/5 urine > negative 2/9 resp: serratia 2/15 BAL serratia  Antimicrobials:  Received remdesivir, steroids, actemra and convalescent plasma  5 days cefepime 12/24-12/29 Cefepime 1/4->1/13 vanc 1/4> 1/11 Ceftriaxone 1/14 > 1/27 vanc x1 1/20 Cefazolin 1/29 > 2/2 cefuroxim 2/2 vanc 2/2 Ceftriaxone x1 2/4 Meropenem 2/15>>2/21 Tobramycin inhaled 2/15>>2/21  Interim history/subjective:  Tolerated 7-hour sweep trial.  After increased work of breathing.  More awake today and more consistently following commands.  Objective   Blood pressure (!) 157/67, pulse 95, temperature 98.6 F (37 C), temperature source Core, resp. rate (!) 32, height _0  (1.727 m), weight 133.8 kg, SpO2 95 %. CVP:  [3 mmHg] 3 mmHg  Vent Mode: PCV FiO2 (%):  [30 %-40 %] 40 % Set Rate:  [20 bmp] 20 bmp PEEP:  [10 cmH20] 10 cmH20 Plateau Pressure:  [29 cmH20-32 cmH20] 29 cmH20   Intake/Output Summary (Last 24 hours) at 10/03/2019 1316 Last data filed at 10/03/2019 1230 Gross per 24 hour  Intake 3089.02 ml  Output 3303 ml  Net -213.98 ml   Filed Weights   10/01/19 0500 10/02/19 0500 10/03/19 0500  Weight: 130.5 kg 129.4 kg 133.8 kg    Examination: GEN: ill appearing obese man on vent HEENT: RIJ Crescent cannula. Tracheostomy in place with no bleeding. CV: RRR, ext warm. HS normal PULM: visible respiratory effort. Clear lungs to posterior axillary line. GI: Soft, PEG CDI EXT: Anasarca improved but still +2 edema in lower extremities  NEURO: Opens eyes to voice, tolerating sitting position with minimal assistance. Will orient to voice and turn head trying to readjust head and shoulders to pain nods appropriately to questions. PSYCH: RASS -1  SKIN: Pale  Vt 5101m Sweep: 3L/min Flow:  5Lmin  Resolved Hospital Problem list   Pneumothoraces-resolved.  Assessment & Plan:   ARDS due to COVID 19, requiring mechanical ventilation and VV ECMO  - Improving, lingering issues with HCAP vs. Bronchitis and profound deconditioning. -Fatigue today following prolonged sweep trial.  Allow rest this morning and resume sweep trial this afternoon. -May benefit from consistent sleep trial time. - Daily sweep trials- will consider overnight trial once able to tolerate 4h.  - Poor lung compliance and diaphragmatic weakness limiting ventilation.   AKI with azotemia - improving  Positive cumulative fluid balance Hypernatremia has corrected Severe third spacing -Decrease free water replacement. - attempting to maintain neg fluid balance   Hypertension - clonidine, amlodipine and propanolol and hydralazine  Serratia pneumonia> treated Stop date 2/22  Neuro muscular weakness Appreciate PT input.  For range of motion exercises.  Bilateral lower feet deep tissue injury Wound care following  Best practice:  Pain/Anxiety/Delirium protocol (if indicated): off all sedation. Dexmedetomidine to maintain comfort for sweep trial.  VAP protocol (if indicated): Bundle in place. Respiratory support goals: Continue current ultra lung protective strategy.  Daily sweep trials. Consider extended  12-hour and possible decannulation if can tolerate an initial 4 hours. DVT prophylaxis: Heparin drip, goal 0.3-0.5 GI prophylaxis: PPI Nutrition: TF Fluid status goals:  Attempting to maintain negative fluid balance.  Diurese as needed. Urinary catheter: Guide hemodynamic management Glucose control: controlled with current regimen Mobility/therapy needs: Bedrest.  Daily labs: as per ECMO protocol. Code Status: Full code  Family Communication: Wife updated 2/23 Disposition: ICU  Labs   CBC: CBC Latest Ref Rng & Units 10/03/2019 10/03/2019 10/03/2019  WBC 4.0 - 10.5 K/uL - - -  Hemoglobin 13.0 -  17.0 g/dL 8.8(L) 10.2(L) 9.5(L)  Hematocrit 39.0 - 52.0 % 26.0(L) 30.0(L) 28.0(L)  Platelets 150 - 400 K/uL - - -     Basic Metabolic Panel: BMP Latest Ref Rng & Units 10/03/2019 10/03/2019 10/03/2019  Glucose 70 - 99 mg/dL - - -  BUN 6 - 20 mg/dL - - -  Creatinine 0.61 - 1.24 mg/dL - - -  Sodium 135 - 145 mmol/L 144 144 143  Potassium 3.5 - 5.1 mmol/L 4.8 4.7 4.6  Chloride 98 - 111 mmol/L - - -  CO2 22 - 32 mmol/L - - -  Calcium 8.9 - 10.3 mg/dL - - -     GFR: Estimated Creatinine Clearance: 156 mL/min (by C-G formula based on SCr of 0.69 mg/dL). Recent Labs  Lab 09/30/19 0500 09/30/19 1557 10/01/19 0445 10/01/19 0445 10/01/19 1551 10/02/19 0452 10/02/19 1607 10/03/19 0315  WBC 11.5*   < > 11.9*   < > 14.1* 13.9* 13.1* 8.6  LATICACIDVEN 1.2  --  1.2  --   --  1.1  --  1.1   < > = values in this interval not displayed.    Liver Function Tests: No results for input(s): AST, ALT, ALKPHOS, BILITOT, PROT, ALBUMIN in the last 168 hours. No results for input(s): LIPASE, AMYLASE in the last 168 hours. No results for input(s): AMMONIA in the last 168 hours.  ABG    Component Value Date/Time   PHART 7.371 10/03/2019 1131   PCO2ART 53.5 (H) 10/03/2019 1131   PO2ART 110.0 (H) 10/03/2019  1131   HCO3 31.0 (H) 10/03/2019 1131   TCO2 33 (H) 10/03/2019 1131   ACIDBASEDEF 4.0 (H) 09/16/2019 2334   O2SAT 98.0 10/03/2019 1131     Coagulation Profile: No results for input(s): INR, PROTIME in the last 168 hours.  Cardiac Enzymes: No results for input(s): CKTOTAL, CKMB, CKMBINDEX, TROPONINI in the last 168 hours.  HbA1C: Hgb A1c MFr Bld  Date/Time Value Ref Range Status  09/23/2019 08:58 AM 5.5 4.8 - 5.6 % Final    Comment:    (NOTE) Pre diabetes:          5.7%-6.4% Diabetes:              >6.4% Glycemic control for   <7.0% adults with diabetes   07/24/2019 04:50 AM 6.7 (H) 4.8 - 5.6 % Final    Comment:    (NOTE) Pre diabetes:          5.7%-6.4% Diabetes:               >6.4% Glycemic control for   <7.0% adults with diabetes     CBG: Recent Labs  Lab 10/02/19 1930 10/02/19 2307 10/03/19 0308 10/03/19 0857 10/03/19 1128  GLUCAP 125* 115* 90 84 177*   This patient is critically ill with multiple organ system failure; which, requires frequent high complexity decision making, assessment, support, evaluation, and titration of therapies. This was completed through the application of advanced monitoring technologies and extensive interpretation of multiple databases. During this encounter critical care time was devoted to patient care services described in this note for 40 minutes.  Kipp Brood, MD [facility] 10/03/2019 1:16 PM

## 2019-10-03 NOTE — CV Procedure (Signed)
   ECMO NOTE:  Indication: Respiratory failure due to COVID PNA  Initial cannulation date: 08/03/2019  ECMO type: VV ECMO (Cardio-Help)  Dual lumen  Inflow/return cannula:  1) 32 FR Crescent placed 2/2  ECMO events:  Initial cannulation 12/26 Circuit changed and trach exchanged on 1/8.   Underwent addition of a RIJ drainage cannula on 1/20 Circuit changed on 1/22 for rising LDH Had evidence of recirculation and RIJ cannula pulled back on 1/26 19 FR RIJ removed 09/06/19 Underwent change to [facility] catheter with removal of femoral lines 2/2 2 hour sweep trial 2/21 Sedation weaned off 2/22 7 hour sweep trial 2/24  Daily data:  Remains on VV ecmo with good flows. Sedation weaned off. Awake. Intermittently following commands per RN staff. Got 1u RBCs this am.    Flow 5.0 RPM 3600 dP 39 -> 48 Pven  -89 Sweep  1 SVO2 71.4%  Labs:  Hgb 8.6 LDH 355 -> 384 -> 412 -> 413 -> 382 Heparin level: 0.30 Lactic acid 1.1  Plan:  Crescent cannula working well Excellent flow and sats. Developed recurrent PNA and sepsis 2/15.  Cx with serratia. Continue meropenum today is 11/14 Sedation turned off 2/22. Awake following commands intermittently Continue to wean support. Sweep at 2 today. Wean as tolerated Vent on minimal settings.  Continue sweep trials.  On minimal precedex.  Diurese gently today Hopefully off early next week   Glori Bickers, MD  8:34 AM

## 2019-10-03 NOTE — Progress Notes (Signed)
Sweep trial started at 1000.  Initial ABG drawn before trial started.  Ventilator FiO2 adjusted to 35% Peep 10, RR 20, PC 15.  ABG drawn at 0930 with no changed made and at 1000 (see flowsheet for VS).  Patient was tachycardic low 100s, RR 35-40 with TV 450-470.  Discussed with Dr. Lynetta Mare and decision was made to stop sweep trial at this time.  Will attempt again this afternoon.  Sweep turned back on at 4 and FiO2 adjusted to 40% by Dr. Lynetta Mare.

## 2019-10-03 NOTE — Progress Notes (Signed)
Sweep trial started at 1640.  ECMO sweep off and ventilator FiO2 at 40%.  See flowsheet for follow up ABGs.  Patient remains without distress and TV 470-500 at this time.  Dr. Lynetta Mare has been updated.

## 2019-10-03 NOTE — Progress Notes (Signed)
Patient ID: [name], male   DOB: [birth year], 44 y.o.   MRN: [id number]    Advanced Heart Failure Rounding Note   Subjective:    Remains on VV ecmo.  BAL growing > 100K serratia. On meropenum day 11/14  CVP  6  Sweep 1 -> 3 -> 2  On vent at 30% TVs > 450-500cc no change  CXR with diffuse infiltrates. Unchanged today  Personally reviewed  Awake on vent but not following commands for me. RN reports some following commands  ABG 7.37/59/92/97%    Co-ox 81%  LDH 384 -> 412 -> 413 -> 382  hgb 8.2 Sodium 145  BUN stable at 86 Cr 0.69  ECMO parameters - see separate ecmo note   Objective:   Weight Range:  Vital Signs:   Temp:  [98.2 F (36.8 C)-98.4 F (36.9 C)] 98.2 F (36.8 C) (02/25 0725) Pulse Rate:  [86-103] 93 (02/25 0725) Resp:  [20-35] 25 (02/25 0725) BP: (121-170)/(53-72) 157/67 (02/25 0610) SpO2:  [93 %-100 %] 100 % (02/25 0746) Arterial Line BP: (118-181)/(48-78) 181/78 (02/25 0725) FiO2 (%):  [30 %-40 %] 30 % (02/25 0746) Weight:  [133.8 kg] 133.8 kg (02/25 0500) Last BM Date: 10/02/19  Weight change: Filed Weights   10/01/19 0500 10/02/19 0500 10/03/19 0500  Weight: 130.5 kg 129.4 kg 133.8 kg    Intake/Output:   Intake/Output Summary (Last 24 hours) at 10/03/2019 0838 Last data filed at 10/03/2019 0725 Gross per 24 hour  Intake 3039.84 ml  Output 3076 ml  Net -36.16 ml     Physical Exam: General:  Awake on vent/ECMO.Not following commands HEENT: normal Neck: supple. + RIJ cannulal  + trach. Carotids 2+ bilat; no bruits. No lymphadenopathy or thryomegaly appreciated. Cor: PMI nondisplaced. Regular rate & rhythm. No rubs, gallops or murmurs. Lungs: clear Abdomen: obese soft, nontender, nondistended. No hepatosplenomegaly. No bruits or masses. Good bowel sounds. Extremities: no cyanosis, clubbing, rash, 1+ edema + feet DTI Neuro: awake on vent not routinely following commands    Telemetry: Sinus 90s Personally  reviewed   Labs: Basic Metabolic Panel: Recent Labs  Lab 09/29/19 0431 09/29/19 0440 09/30/19 0500 09/30/19 0508 10/01/19 0445 10/01/19 0912 10/01/19 1551 10/01/19 1812 10/02/19 0452 10/02/19 0742 10/02/19 1607 10/02/19 1705 10/02/19 1802 10/02/19 2024 10/02/19 2354 10/03/19 0315 10/03/19 0417  NA 152*   < > 148*   < > 147*   < > 146*   < > 146*   < > 144   < > 146* 144 144 145 145  K 4.7   < > 4.7   < > 4.5   < > 4.6   < > 4.6   < > 4.6   < > 4.5 4.5 4.6 4.6 4.5  CL 113*   < > 106   < > 108  --  109  --  108  --  109  --   --   --   --  108  --   CO2 32   < > 31   < > 30  --  30  --  29  --  31  --   --   --   --  30  --   GLUCOSE 156*   < > 115*   < > 87  --  109*  --  106*  --  114*  --   --   --   --  95  --   BUN 88*   < >  88*   < > 85*  --  94*  --  86*  --  93*  --   --   --   --  86*  --   CREATININE 0.67   < > 0.78   < > 0.76  --  0.71  --  0.69  --  0.66  --   --   --   --  0.69  --   CALCIUM 9.0   < > 8.8*   < > 8.9   < > 8.9   < > 8.8*  --  8.7*  --   --   --   --  8.7*  --   MG 2.3  --  2.2  --  2.1  --   --   --  2.2  --   --   --   --   --   --  2.2  --   PHOS 4.2  --  4.3  --  3.8  --   --   --  3.6  --   --   --   --   --   --  3.8  --    < > = values in this interval not displayed.    Liver Function Tests: No results for input(s): AST, ALT, ALKPHOS, BILITOT, PROT, ALBUMIN in the last 168 hours. No results for input(s): LIPASE, AMYLASE in the last 168 hours. No results for input(s): AMMONIA in the last 168 hours.  CBC: Recent Labs  Lab 10/01/19 0445 10/01/19 0912 10/01/19 1551 10/01/19 1812 10/02/19 0452 10/02/19 0742 10/02/19 1607 10/02/19 1705 10/02/19 1802 10/02/19 2024 10/02/19 2354 10/03/19 0315 10/03/19 0417  WBC 11.9*  --  14.1*  --  13.9*  --  13.1*  --   --   --   --  8.6  --   HGB 8.8*   < > 8.7*   < > 8.8*   < > 8.0*   < > 8.8* 8.5* 8.2* 7.4* 8.2*  HCT 30.5*   < > 30.2*   < > 30.6*   < > 28.0*   < > 26.0* 25.0* 24.0* 26.1* 24.0*   MCV 96.8  --  96.2  --  96.2  --  97.2  --   --   --   --  96.0  --   PLT 124*  --  126*  --  130*  --  118*  --   --   --   --  98*  --    < > = values in this interval not displayed.    Cardiac Enzymes: No results for input(s): CKTOTAL, CKMB, CKMBINDEX, TROPONINI in the last 168 hours.  BNP: BNP (last 3 results) Recent Labs    07/22/19 1039  BNP 15.3    ProBNP (last 3 results) No results for input(s): PROBNP in the last 8760 hours.    Other results:  Imaging: DG CHEST PORT 1 VIEW  Result Date: 10/02/2019 CLINICAL DATA:  Tracheostomy. EXAM: PORTABLE CHEST 1 VIEW COMPARISON:  October 01, 2019. FINDINGS: Stable cardiomegaly. Tracheostomy tube is in good position. Stable bilateral lung opacities are noted consistent with multifocal pneumonia. Bony thorax is unremarkable. IMPRESSION: Stable bilateral lung opacities consistent with multifocal pneumonia. Electronically Signed   By: Marijo Conception M.D.   On: 10/02/2019 09:15     Medications:     Scheduled Medications: . sodium chloride   Intravenous Once  . acetaminophen (TYLENOL) oral liquid  160 mg/5 mL  650 mg Oral Q6H  . amiodarone  200 mg Oral Daily  . amLODipine  10 mg Oral Daily  . vitamin C  500 mg Per Tube Daily  . chlorhexidine gluconate (MEDLINE KIT)  15 mL Mouth Rinse BID  . Chlorhexidine Gluconate Cloth  6 each Topical Daily  . cloNIDine  0.2 mg Oral Q6H  . feeding supplement (OSMOLITE 1.5 CAL)  237 mL Per Tube 6 X Daily  . feeding supplement (PRO-STAT SUGAR FREE 64)  60 mL Per Tube QID  . free water  300 mL Per Tube Q6H  . hydrALAZINE  25 mg Per Tube Q8H  . insulin aspart  0-20 Units Subcutaneous Q4H  . insulin aspart  5 Units Subcutaneous Q4H  . insulin detemir  18 Units Subcutaneous BID  . ipratropium-albuterol  3 mL Nebulization Q4H  . magic mouthwash  5 mL Oral TID  . mouth rinse  15 mL Mouth Rinse 10 times per day  . montelukast  10 mg Per Tube QHS  . nutrition supplement (JUVEN)  1 packet Per Tube  BID BM  . pantoprazole sodium  40 mg Per Tube BID  . propranolol  80 mg Per Tube TID  . psyllium  1 packet Oral BID  . QUEtiapine  50 mg Per Tube QHS  . sodium chloride flush  10-40 mL Intracatheter Q12H  . white petrolatum   Topical BID    Infusions: . sodium chloride    . sodium chloride Stopped (09/30/19 0734)  . sodium chloride Stopped (10/03/19 0307)  . dexmedetomidine (PRECEDEX) IV infusion 0.05 mcg/kg/hr (10/03/19 0700)  . heparin 1,550 Units/hr (10/03/19 0700)  . meropenem (MERREM) IV 2 g (10/03/19 0817)    PRN Medications: Place/Maintain arterial line **AND** sodium chloride, sodium chloride, sodium chloride, acetaminophen (TYLENOL) oral liquid 160 mg/5 mL, acetaminophen, [COMPLETED] diazepam **FOLLOWED BY** diazepam, hydrALAZINE, HYDROmorphone, magnesium hydroxide, midazolam, ondansetron (ZOFRAN) IV, senna-docusate, sodium chloride, sodium chloride flush   Assessment/Plan:    1. Acute hypoxic/hypercarbic respiratory failure due to COVID PNA: Remained markedly hypoxic despite full support. Intubated 12/26. S/p bilateral CTs 12/26 for pneumothoraces.  TEE on 12/26 LVEF 70% RV ok. VV ECMO begun 12/26. Tracheostomy 12/29.  COVID ARDS at this point. ECMO circuit changed 1/8 & 1/23. Crescent cannula placed 2/2 (femoral cannuals removed) - ECMO parameters as per ECMO note - Flows look good on Crescent - Developed PNA and sepsis on 2/15.  BAL with > 100K serratia. Finished nebulized tobra. On mero. Today day 11/14 of meropenum - Off all sedation since 2/22. More awake today but still not following commands routinely though nurses report some following commands. On very low-dose Precedex. Avoid benzos and narcotics if possible - Continue to watch mental status closely. If not improved  will need repeat head CT and brief HD with Renal to clean. I suspect this is just lingering sedation effects - Continue to daily sweep trials and autowean of sweep. Sweep at 1 yesterday. Now at 2. ABG  looks good. Continue to wean.  - Volume status slightly up. Give one dose IV lasix today - LDH stable - Continue heparin. Level 0.30. Discussed dosing with PharmD personally. - Continue chest PT  2. Bilateral PTX: Due to barotrauma.  - Resolved. CTs out  3. COVID PNA: management of COVID infection per CCM. CXR with bilateral multifocal PNA progressing to likely ARDS.  - s/p 10 days of remdesivir - 12/16, 12/2 s/p tociluzimab - 12/17 s/p convalescent plasma - Decadron at  3m/daily completed 1/11 - Completed cefepime/vanc - Off Ancef  4. ID: Empiric coverage with vancomycin/cefepime initially for possible secondary bacterial PNA, course completed.   - treated with cefepime and vanc for + cx with staph epidimidis, MSSA and serratia (sensitivities ok). - developed recurrent PNA and sepsis 2/15 - BAL with > 100K serratia. On mero.   - Developed PNA and sepsis on 2/15.  BAL with > 100K serratia. Finished nebulized tobra. On mero. Today day 10/14 of meropenum. Watch closely for worsening infection   5. Anemia: - hgb 8.2 this am after transfusion Transfuse as needed to keep Hgb> 8.0  7. FEN - now with G-tube.Continue TFs  8. Hypernatremia - Na 145 - Continue FW at current dose  9. AKI - Volume status looks good. AKI and azotemia improved with IVF and FW. See discussion above - BUN 86 today  10. DTIs - WOC following. Has evolving wound on left 5th toe. Continue to follow  D/w with ECMO team (ECMO coordinator/specialists, CCM, TCTS and pharmD) at bedside on multidisciplinary rounds. Continue to minimize sedation. Diurese today. Repeat sweep trial. Seems like he may be able to come off next week if continues to progress and mental status improves.   Length of Stay: 758 CRITICAL CARE Performed by: BGlori Bickers Total critical care time: 35 minutes  Critical care time was exclusive of separately billable procedures and treating other patients.  Critical care was necessary  to treat or prevent imminent or life-threatening deterioration.  Critical care was time spent personally by me (independent of midlevel providers or residents) on the following activities: development of treatment plan with patient and/or surrogate as well as nursing, discussions with consultants, evaluation of patient's response to treatment, examination of patient, obtaining history from patient or surrogate, ordering and performing treatments and interventions, ordering and review of laboratory studies, ordering and review of radiographic studies, pulse oximetry and re-evaluation of patient's condition.    DGlori BickersMD 10/03/2019, 8:38 AM  Advanced Heart Failure Team Pager [phone](M-F; 7a - 4p)  Please contact [email or web address] for night-coverage after hours (4p -7a ) and weekends on [email or web address]

## 2019-10-03 NOTE — Progress Notes (Addendum)
ANTICOAGULATION CONSULT NOTE - Follow Up Consult  Pharmacy Consult for heparin Indication: ECMO  Labs: Recent Labs    10/02/19 0452 10/02/19 0742 10/02/19 1607 10/02/19 1705 10/02/19 2354 10/02/19 2354 10/03/19 0315 10/03/19 0417  HGB 8.8*   < > 8.0*   < > 8.2*   < > 7.4* 8.2*  HCT 30.6*   < > 28.0*   < > 24.0*  --  26.1* 24.0*  PLT 130*  --  118*  --   --   --  98*  --   APTT 60*  --  68*  --   --   --  77*  --   HEPARINUNFRC 0.27*  --  0.30  --   --   --  0.30  --   CREATININE 0.69  --  0.66  --   --   --  0.69  --    < > = values in this interval not displayed.    Assessment: 44yo male on ECMO.  Heparin level now therapeutic at 0.3, on 1550 units/hr. Hgb trended down to 8.2, plt down to 118>98- received 1 PRBC today. LDH 382, fibrinogen 373 - stable. No overt bleeding or complications reported.  Goal of Therapy:  Heparin level 0.3-0.4 units/ml   Plan:  Continue IV heparin at current rate of 1550 units/hr. Heparin levels and CBC with routine ECMO labs. Monitor for s/sx bleeding   Antonietta Jewel, PharmD, BCCCP Clinical Pharmacist  Phone: [phone]  Please check AMION for all [place] phone numbers After 10:00 PM, call [place] [phone] 10/03/2019 7:10 AM

## 2019-10-04 ENCOUNTER — Inpatient Hospital Stay (HOSPITAL_COMMUNITY): Payer: Managed Care, Other (non HMO)

## 2019-10-04 DIAGNOSIS — G6281 Critical illness polyneuropathy: Secondary | ICD-10-CM

## 2019-10-04 LAB — POCT I-STAT 7, (LYTES, BLD GAS, ICA,H+H)
Acid-Base Excess: 6 mmol/L — ABNORMAL HIGH (ref 0.0–2.0)
Acid-Base Excess: 8 mmol/L — ABNORMAL HIGH (ref 0.0–2.0)
Acid-Base Excess: 6 mmol/L — ABNORMAL HIGH (ref 0.0–2.0)
Acid-Base Excess: 7 mmol/L — ABNORMAL HIGH (ref 0.0–2.0)
Acid-Base Excess: 5 mmol/L — ABNORMAL HIGH (ref 0.0–2.0)
Acid-Base Excess: 5 mmol/L — ABNORMAL HIGH (ref 0.0–2.0)
Acid-Base Excess: 7 mmol/L — ABNORMAL HIGH (ref 0.0–2.0)
Acid-Base Excess: 7 mmol/L — ABNORMAL HIGH (ref 0.0–2.0)
Bicarbonate: 32.3 mmol/L — ABNORMAL HIGH (ref 20.0–28.0)
Bicarbonate: 33.1 mmol/L — ABNORMAL HIGH (ref 20.0–28.0)
Bicarbonate: 32.1 mmol/L — ABNORMAL HIGH (ref 20.0–28.0)
Bicarbonate: 34 mmol/L — ABNORMAL HIGH (ref 20.0–28.0)
Bicarbonate: 32.8 mmol/L — ABNORMAL HIGH (ref 20.0–28.0)
Bicarbonate: 30.9 mmol/L — ABNORMAL HIGH (ref 20.0–28.0)
Bicarbonate: 33 mmol/L — ABNORMAL HIGH (ref 20.0–28.0)
Bicarbonate: 33.3 mmol/L — ABNORMAL HIGH (ref 20.0–28.0)
Calcium, Ion: 1.27 mmol/L (ref 1.15–1.40)
Calcium, Ion: 1.25 mmol/L (ref 1.15–1.40)
Calcium, Ion: 1.26 mmol/L (ref 1.15–1.40)
Calcium, Ion: 1.28 mmol/L (ref 1.15–1.40)
Calcium, Ion: 1.28 mmol/L (ref 1.15–1.40)
Calcium, Ion: 1.26 mmol/L (ref 1.15–1.40)
Calcium, Ion: 1.27 mmol/L (ref 1.15–1.40)
Calcium, Ion: 1.27 mmol/L (ref 1.15–1.40)
HCT: 23 % — ABNORMAL LOW (ref 39.0–52.0)
HCT: 25 % — ABNORMAL LOW (ref 39.0–52.0)
HCT: 25 % — ABNORMAL LOW (ref 39.0–52.0)
HCT: 28 % — ABNORMAL LOW (ref 39.0–52.0)
HCT: 27 % — ABNORMAL LOW (ref 39.0–52.0)
HCT: 28 % — ABNORMAL LOW (ref 39.0–52.0)
HCT: 25 % — ABNORMAL LOW (ref 39.0–52.0)
HCT: 25 % — ABNORMAL LOW (ref 39.0–52.0)
Hemoglobin: 7.8 g/dL — ABNORMAL LOW (ref 13.0–17.0)
Hemoglobin: 8.5 g/dL — ABNORMAL LOW (ref 13.0–17.0)
Hemoglobin: 8.5 g/dL — ABNORMAL LOW (ref 13.0–17.0)
Hemoglobin: 9.5 g/dL — ABNORMAL LOW (ref 13.0–17.0)
Hemoglobin: 9.2 g/dL — ABNORMAL LOW (ref 13.0–17.0)
Hemoglobin: 9.5 g/dL — ABNORMAL LOW (ref 13.0–17.0)
Hemoglobin: 8.5 g/dL — ABNORMAL LOW (ref 13.0–17.0)
Hemoglobin: 8.5 g/dL — ABNORMAL LOW (ref 13.0–17.0)
O2 Saturation: 97 %
O2 Saturation: 97 %
O2 Saturation: 95 %
O2 Saturation: 94 %
O2 Saturation: 94 %
O2 Saturation: 94 %
O2 Saturation: 96 %
O2 Saturation: 97 %
Patient temperature: 37
Patient temperature: 37
Patient temperature: 37
Patient temperature: 37
Patient temperature: 36.8
Patient temperature: 36.9
Patient temperature: 36.9
Potassium: 4.8 mmol/L (ref 3.5–5.1)
Potassium: 4.8 mmol/L (ref 3.5–5.1)
Potassium: 4.5 mmol/L (ref 3.5–5.1)
Potassium: 4.7 mmol/L (ref 3.5–5.1)
Potassium: 4.7 mmol/L (ref 3.5–5.1)
Potassium: 4.6 mmol/L (ref 3.5–5.1)
Potassium: 4.7 mmol/L (ref 3.5–5.1)
Potassium: 4.5 mmol/L (ref 3.5–5.1)
Sodium: 144 mmol/L (ref 135–145)
Sodium: 144 mmol/L (ref 135–145)
Sodium: 145 mmol/L (ref 135–145)
Sodium: 144 mmol/L (ref 135–145)
Sodium: 145 mmol/L (ref 135–145)
Sodium: 144 mmol/L (ref 135–145)
Sodium: 144 mmol/L (ref 135–145)
Sodium: 145 mmol/L (ref 135–145)
TCO2: 34 mmol/L — ABNORMAL HIGH (ref 22–32)
TCO2: 35 mmol/L — ABNORMAL HIGH (ref 22–32)
TCO2: 34 mmol/L — ABNORMAL HIGH (ref 22–32)
TCO2: 36 mmol/L — ABNORMAL HIGH (ref 22–32)
TCO2: 35 mmol/L — ABNORMAL HIGH (ref 22–32)
TCO2: 32 mmol/L (ref 22–32)
TCO2: 35 mmol/L — ABNORMAL HIGH (ref 22–32)
TCO2: 35 mmol/L — ABNORMAL HIGH (ref 22–32)
pCO2 arterial: 54.3 mmHg — ABNORMAL HIGH (ref 32.0–48.0)
pCO2 arterial: 50 mmHg — ABNORMAL HIGH (ref 32.0–48.0)
pCO2 arterial: 53.1 mmHg — ABNORMAL HIGH (ref 32.0–48.0)
pCO2 arterial: 62.9 mmHg — ABNORMAL HIGH (ref 32.0–48.0)
pCO2 arterial: 64.8 mmHg — ABNORMAL HIGH (ref 32.0–48.0)
pCO2 arterial: 50.7 mmHg — ABNORMAL HIGH (ref 32.0–48.0)
pCO2 arterial: 52.9 mmHg — ABNORMAL HIGH (ref 32.0–48.0)
pCO2 arterial: 54.6 mmHg — ABNORMAL HIGH (ref 32.0–48.0)
pH, Arterial: 7.382 (ref 7.350–7.450)
pH, Arterial: 7.428 (ref 7.350–7.450)
pH, Arterial: 7.389 (ref 7.350–7.450)
pH, Arterial: 7.341 — ABNORMAL LOW (ref 7.350–7.450)
pH, Arterial: 7.313 — ABNORMAL LOW (ref 7.350–7.450)
pH, Arterial: 7.392 (ref 7.350–7.450)
pH, Arterial: 7.402 (ref 7.350–7.450)
pH, Arterial: 7.393 (ref 7.350–7.450)
pO2, Arterial: 98 mmHg (ref 83.0–108.0)
pO2, Arterial: 93 mmHg (ref 83.0–108.0)
pO2, Arterial: 77 mmHg — ABNORMAL LOW (ref 83.0–108.0)
pO2, Arterial: 79 mmHg — ABNORMAL LOW (ref 83.0–108.0)
pO2, Arterial: 78 mmHg — ABNORMAL LOW (ref 83.0–108.0)
pO2, Arterial: 71 mmHg — ABNORMAL LOW (ref 83.0–108.0)
pO2, Arterial: 87 mmHg (ref 83.0–108.0)
pO2, Arterial: 94 mmHg (ref 83.0–108.0)

## 2019-10-04 LAB — CBC
HCT: 28.2 % — ABNORMAL LOW (ref 39.0–52.0)
HCT: 27.5 % — ABNORMAL LOW (ref 39.0–52.0)
HCT: 28.8 % — ABNORMAL LOW (ref 39.0–52.0)
Hemoglobin: 8.2 g/dL — ABNORMAL LOW (ref 13.0–17.0)
Hemoglobin: 7.9 g/dL — ABNORMAL LOW (ref 13.0–17.0)
Hemoglobin: 8.2 g/dL — ABNORMAL LOW (ref 13.0–17.0)
MCH: 27.4 pg (ref 26.0–34.0)
MCH: 27.2 pg (ref 26.0–34.0)
MCH: 27 pg (ref 26.0–34.0)
MCHC: 29.1 g/dL — ABNORMAL LOW (ref 30.0–36.0)
MCHC: 28.7 g/dL — ABNORMAL LOW (ref 30.0–36.0)
MCHC: 28.5 g/dL — ABNORMAL LOW (ref 30.0–36.0)
MCV: 94.3 fL (ref 80.0–100.0)
MCV: 94.8 fL (ref 80.0–100.0)
MCV: 94.7 fL (ref 80.0–100.0)
Platelets: 95 10*3/uL — ABNORMAL LOW (ref 150–400)
Platelets: 101 10*3/uL — ABNORMAL LOW (ref 150–400)
Platelets: 109 10*3/uL — ABNORMAL LOW (ref 150–400)
RBC: 2.99 MIL/uL — ABNORMAL LOW (ref 4.22–5.81)
RBC: 2.9 MIL/uL — ABNORMAL LOW (ref 4.22–5.81)
RBC: 3.04 MIL/uL — ABNORMAL LOW (ref 4.22–5.81)
RDW: 17.4 % — ABNORMAL HIGH (ref 11.5–15.5)
RDW: 17.2 % — ABNORMAL HIGH (ref 11.5–15.5)
RDW: 17.3 % — ABNORMAL HIGH (ref 11.5–15.5)
WBC: 8.9 10*3/uL (ref 4.0–10.5)
WBC: 11.9 10*3/uL — ABNORMAL HIGH (ref 4.0–10.5)
WBC: 11.7 10*3/uL — ABNORMAL HIGH (ref 4.0–10.5)
nRBC: 0 % (ref 0.0–0.2)
nRBC: 0 % (ref 0.0–0.2)
nRBC: 0.2 % (ref 0.0–0.2)

## 2019-10-04 LAB — BASIC METABOLIC PANEL
Anion gap: 8 (ref 5–15)
BUN: 82 mg/dL — ABNORMAL HIGH (ref 6–20)
CO2: 29 mmol/L (ref 22–32)
Calcium: 8.5 mg/dL — ABNORMAL LOW (ref 8.9–10.3)
Chloride: 106 mmol/L (ref 98–111)
Creatinine, Ser: 0.68 mg/dL (ref 0.61–1.24)
GFR calc Af Amer: >60 mL/min (ref >60)
GFR calc non Af Amer: >60 mL/min (ref >60)
Glucose, Bld: 123 mg/dL — ABNORMAL HIGH (ref 70–99)
Potassium: 4.9 mmol/L (ref 3.5–5.1)
Sodium: 143 mmol/L (ref 135–145)

## 2019-10-04 LAB — GLUCOSE, CAPILLARY
Glucose-Capillary: 103 mg/dL — ABNORMAL HIGH (ref 70–99)
Glucose-Capillary: 121 mg/dL — ABNORMAL HIGH (ref 70–99)
Glucose-Capillary: 124 mg/dL — ABNORMAL HIGH (ref 70–99)
Glucose-Capillary: 138 mg/dL — ABNORMAL HIGH (ref 70–99)
Glucose-Capillary: 178 mg/dL — ABNORMAL HIGH (ref 70–99)
Glucose-Capillary: 85 mg/dL (ref 70–99)

## 2019-10-04 LAB — COOXEMETRY PANEL
Carboxyhemoglobin: 3.1 % — ABNORMAL HIGH (ref 0.5–1.5)
Methemoglobin: 1.2 % (ref 0.0–1.5)
O2 Saturation: 78.6 %
Total hemoglobin: 9.1 g/dL — ABNORMAL LOW (ref 12.0–16.0)

## 2019-10-04 LAB — HEPARIN LEVEL (UNFRACTIONATED)
Heparin Unfractionated: 0.34 IU/mL (ref 0.30–0.70)
Heparin Unfractionated: 0.29 IU/mL — ABNORMAL LOW (ref 0.30–0.70)

## 2019-10-04 LAB — FIBRINOGEN: Fibrinogen: 352 mg/dL (ref 210–475)

## 2019-10-04 LAB — PHOSPHORUS: Phosphorus: 3.6 mg/dL (ref 2.5–4.6)

## 2019-10-04 LAB — APTT
aPTT: 72 seconds — ABNORMAL HIGH (ref 24–36)
aPTT: 68 seconds — ABNORMAL HIGH (ref 24–36)

## 2019-10-04 LAB — MAGNESIUM: Magnesium: 2.2 mg/dL (ref 1.7–2.4)

## 2019-10-04 LAB — LACTATE DEHYDROGENASE: LDH: 378 U/L — ABNORMAL HIGH (ref 98–192)

## 2019-10-04 LAB — LACTIC ACID, PLASMA: Lactic Acid, Venous: 1.5 mmol/L (ref 0.5–1.9)

## 2019-10-04 MED ORDER — FUROSEMIDE 10 MG/ML IJ SOLN
40.0000 mg | Freq: Once | INTRAMUSCULAR | Status: AC
  Administered 2019-10-04: 40 mg via INTRAVENOUS
  Filled 2019-10-04: qty 4

## 2019-10-04 MED ORDER — TESTOSTERONE 50 MG/5GM (1%) TD GEL
5.0000 g | Freq: Every day | TRANSDERMAL | Status: DC
  Administered 2019-10-04 – 2019-10-17 (×14): 5 g via TRANSDERMAL
  Filled 2019-10-04 (×14): qty 5

## 2019-10-04 MED ORDER — ACETAMINOPHEN 160 MG/5ML PO SOLN
650.0000 mg | Freq: Four times a day (QID) | ORAL | Status: DC
  Administered 2019-10-04 – 2019-10-20 (×59): 650 mg
  Filled 2019-10-04 (×59): qty 20.3

## 2019-10-04 NOTE — Progress Notes (Signed)
Inpatient Diabetes Program Recommendations  AACE/ADA: New Consensus Statement on Inpatient Glycemic Control (2015)  Target Ranges:  Prepandial:   less than 140 mg/dL      Peak postprandial:   less than 180 mg/dL (1-2 hours)      Critically ill patients:  140 - 180 mg/dL   Lab Results  Component Value Date   GLUCAP 85 10/04/2019   HGBA1C 5.5 09/23/2019    Review of Glycemic Control Results for [name], [name] (MRN [id number]) as of 10/04/2019 10:33  Ref. Range 10/03/2019 16:58 10/03/2019 20:02 10/03/2019 23:46 10/04/2019 03:42 10/04/2019 07:40  Glucose-Capillary Latest Ref Range: 70 - 99 mg/dL 109 (H) 116 (H) 81 121 (H) 85   Inpatient Diabetes Program Recommendations:   Consider: -Decrease Levemir to 15 units bid -Decrease Novolog correction to moderate q 4 hrs.  Thank you, Nani Gasser. Kirti Carl, RN, MSN, CDE  Diabetes Coordinator Inpatient Glycemic Control Team Team Pager [phone] (8am-5pm) 10/04/2019 10:35 AM

## 2019-10-04 NOTE — CV Procedure (Signed)
   ECMO NOTE:  Indication: Respiratory failure due to COVID PNA  Initial cannulation date: 08/03/2019  ECMO type: VV ECMO (Cardio-Help)  Dual lumen  Inflow/return cannula:  1) 32 FR Crescent placed 2/2  ECMO events:  Initial cannulation 12/26 Circuit changed and trach exchanged on 1/8.   Underwent addition of a RIJ drainage cannula on 1/20 Circuit changed on 1/22 for rising LDH Had evidence of recirculation and RIJ cannula pulled back on 1/26 19 FR RIJ removed 09/06/19 Underwent change to [facility] catheter with removal of femoral lines 2/2 2 hour sweep trial 2/21 Sedation weaned off 2/22 7 hour sweep trial 2/24 3 hours of sweep trial 2/25  Daily data:  Remains on VV ecmo with good flows. Sedation weaned off. Awake. Following commands but appears unable to move.    Flow 4.9 RPM 3550 dP 37 Pven  -78 Sweep  2 SVO2 75.6%  Labs:  Hgb 7.9 LDH 355 -> 384 -> 412 -> 413 -> 382 -> 378 Heparin level: 0.29 Lactic acid 1.5  Plan:  Crescent cannula working well Excellent flow and sats. Developed recurrent PNA and sepsis 2/15.  Cx with serratia. Continue meropenum today is 12/14 Sedation turned off 2/22. Awake following commands intermittently but very weak Continue to wean support. Sweep at 2 today. Wean as tolerated Vent on minimal settings.  Continue sweep trials.  On minimal precedex.  Will ask Neuro to see given evidence of apparent ICU myopathy  Glori Bickers, MD  8:34 AM

## 2019-10-04 NOTE — Progress Notes (Signed)
ANTICOAGULATION CONSULT NOTE - Follow Up Consult  Pharmacy Consult for Heparin Indication: ECMO  Labs: Recent Labs    10/03/19 0315 10/03/19 0417 10/03/19 1619 10/03/19 1700 10/04/19 0340 10/04/19 0517 10/04/19 1306 10/04/19 1306 10/04/19 1628 10/04/19 1635  HGB 7.4*   < > 8.3*   < > 8.2*   < > 9.5*   < > 7.9* 8.5*  HCT 26.1*   < > 28.5*   < > 28.2*   < > 28.0*  --  27.5* 25.0*  PLT 98*   < > 100*  --  95*  --   --   --  101*  --   APTT 77*   < > 65*  --  72*  --   --   --  68*  --   HEPARINUNFRC 0.30   < > 0.25*  --  0.34  --   --   --  0.29*  --   CREATININE 0.69  --  0.72  --  0.68  --   --   --   --   --    < > = values in this interval not displayed.    Assessment: 44yo male on ECMO.  Heparin level now very slightly subtherapeutic at 0.29 on 1600 units/hr. Hgb 8.5, plt 101 stable - received 1 PRBC 2/25. LDH 378, fibrinogen 352 - stable. Minimal bleeding from cannula site noted this AM, now resolved per RN. No issues with the infusion reported.  Goal of Therapy:  Heparin level 0.3-0.4 units/ml   Plan:  Increase IV heparin slightly to 1650 units/hr. Heparin levels and CBC with routine ECMO labs. Monitor for increased bleeding   Elicia Lamp, PharmD, BCPS Please check AMION for all [place] contact numbers Clinical Pharmacist 10/04/2019 5:35 PM

## 2019-10-04 NOTE — Progress Notes (Signed)
ANTICOAGULATION CONSULT NOTE - Follow Up Consult  Pharmacy Consult for Heparin Indication: ECMO  Labs: Recent Labs    10/02/19 1607 10/02/19 1705 10/03/19 0315 10/03/19 0417 10/03/19 1619 10/03/19 1700 10/03/19 1903 10/03/19 1903 10/03/19 2005 10/04/19 0340  HGB 8.0*   < > 7.4*   < > 8.3*   < > 8.8*   < > 8.5* 8.2*  HCT 28.0*   < > 26.1*   < > 28.5*   < > 26.0*  --  25.0* 28.2*  PLT 118*   < > 98*  --  100*  --   --   --   --  95*  APTT 68*   < > 77*  --  65*  --   --   --   --  72*  HEPARINUNFRC 0.30   < > 0.30  --  0.25*  --   --   --   --  0.34  CREATININE 0.66  --  0.69  --  0.72  --   --   --   --   --    < > = values in this interval not displayed.    Assessment: 44yo male on ECMO.  Heparin level now therapeutic at 0.3, on 1550 units/hr. Hgb trended down to 8.2, plt down to 118>98- received 1 PRBC today. LDH 382, fibrinogen 373 - stable. No overt bleeding or complications reported.  2/26 AM update: Heparin level back in goal range this AM Hgb low/stable Some bleeding from cannula site  Goal of Therapy:  Heparin level 0.3-0.4 units/ml   Plan:  Continue IV heparin at current rate of 1600 units/hr. Heparin levels and CBC with routine ECMO labs. Monitor for increased bleeding  Narda Bonds, PharmD, BCPS Clinical Pharmacist Phone: [phone]

## 2019-10-04 NOTE — Consult Note (Addendum)
Neurology Consultation  Reason for Consult: Generalized weakness Referring Physician: Dr. Pierre Bali  CC: Generalized weakness  History is obtained from: Chart review  HPI: [name] is a 44 y.o. male past medical history of hypertension, who was admitted on 07/22/2019 with Covid pneumonia leading to ARDS, pneumomediastinum and intubated 08/03/2019 with bilateral chest tubes placed.  Continue to have worsening hypoxemia and was started on VV femorofemoral ECMO on 08/04/2019, with a tracheostomy placed on 08/06/2019.  He had some epistaxis on 08/13/2019 for which he required nasal packing.  His chest x-ray reported from 09/07/2019 showed improvement with improved lung compliance of tidal volume in the 350s.  Next day infiltrates were increased on the chest x-ray and was treated for Bactrim pneumonia.  He got a PEG tube placement on 09/11/2019.  He is since 09/22/2019 getting sleep trials and has been increasingly awake but neurological consultation was placed due to severe generalized weakness with some ability to move his shoulders but complete inability to move arms or legs. Critical care team is also following the patient. Patient is unable to provide any history due to his current medical condition.   ROS: Unable to obtain due to altered mental status.   Past Medical History:  Diagnosis Date  . Asthma   . GERD (gastroesophageal reflux disease)   . Headache   . History of kidney stones    LEFT URETERAL STONE  . HTN (hypertension)   . Pancreatitis 2018   GALLBALDDER SLUDGE CAUSED ISSUED RESOLVED    Family History  Problem Relation Age of Onset  . Asthma Brother   . Diabetes Father   . Hypertension Father   . Diabetes Paternal Grandmother   . Hypertension Paternal Grandmother   . Migraines Mother   . GER disease Mother   . Pancreatic cancer Maternal Grandmother   . Heart disease Maternal Grandfather    Social History:   reports that he has never smoked. He has never used  smokeless tobacco. He reports current alcohol use. He reports that he does not use drugs.  Medications  Current Facility-Administered Medications:  .  0.9 %  sodium chloride infusion (Manually program via Guardrails IV Fluids), , Intravenous, Once, Bensimhon, Shaune Pascal, MD .  Place/Maintain arterial line, , , Until Discontinued **AND** 0.9 %  sodium chloride infusion, , Intra-arterial, PRN, Prescott Gum, Collier Salina, MD .  0.9 %  sodium chloride infusion, , Intravenous, PRN, Prescott Gum, Collier Salina, MD, Stopped at 09/30/19 [phone] .  0.9 %  sodium chloride infusion, , Intravenous, PRN, Prescott Gum, Collier Salina, MD, Stopped at 10/04/19 [phone] .  acetaminophen (TYLENOL) 160 MG/5ML solution 650 mg, 650 mg, Per Tube, Q6H PRN, Prescott Gum, Collier Salina, MD .  acetaminophen (TYLENOL) 160 MG/5ML solution 650 mg, 650 mg, Per Tube, Q6H, Wonda Olds, MD, 650 mg at 10/04/19 0831 .  acetaminophen (TYLENOL) tablet 650 mg, 650 mg, Per Tube, Q6H PRN, Prescott Gum, Collier Salina, MD .  amiodarone (PACERONE) tablet 200 mg, 200 mg, Oral, Daily, Agarwala, Ravi, MD, 200 mg at 10/03/19 0946 .  amLODipine (NORVASC) tablet 10 mg, 10 mg, Oral, Daily, Candee Furbish, MD, 10 mg at 10/03/19 0946 .  ascorbic acid (VITAMIN C) tablet 500 mg, 500 mg, Per Tube, Daily, Prescott Gum, Collier Salina, MD, 500 mg at 10/03/19 0946 .  chlorhexidine gluconate (MEDLINE KIT) (PERIDEX) 0.12 % solution 15 mL, 15 mL, Mouth Rinse, BID, Prescott Gum, Collier Salina, MD, 15 mL at 10/04/19 0756 .  Chlorhexidine Gluconate Cloth 2 % PADS 6 each, 6  each, Topical, Daily, Kipp Brood, MD, 6 each at 10/03/19 1745 .  cloNIDine (CATAPRES) tablet 0.2 mg, 0.2 mg, Oral, Q6H, Atkins, Glenice Bow, MD, 0.2 mg at 10/04/19 0521 .  dexmedetomidine (PRECEDEX) 400 MCG/100ML (4 mcg/mL) infusion, 0-1.6 mcg/kg/hr, Intravenous, Titrated, Agarwala, Ravi, MD, Last Rate: 3.55 mL/hr at 10/04/19 0700, 0.1 mcg/kg/hr at 10/04/19 0700 .  [COMPLETED] diazepam (VALIUM) tablet 10 mg, 10 mg, Oral, Q6H, 10 mg at 09/22/19 1123 **FOLLOWED BY**  diazepam (VALIUM) tablet 2.5 mg, 2.5 mg, Per Tube, Q12H PRN, Candee Furbish, MD, 2.5 mg at 09/28/19 2027 .  feeding supplement (OSMOLITE 1.5 CAL) liquid 237 mL, 237 mL, Per Tube, 6 X Daily, Einar Grad, RPH, 237 mL at 10/04/19 0800 .  feeding supplement (PRO-STAT SUGAR FREE 64) liquid 60 mL, 60 mL, Per Tube, QID, Atkins, Broadus Z, MD, 60 mL at 10/04/19 0800 .  free water 300 mL, 300 mL, Per Tube, Q6H, Agarwala, Ravi, MD, 300 mL at 10/04/19 0518 .  Gerhardt's butt cream, , Topical, QID, Agarwala, Ravi, MD, Given at 10/03/19 2116 .  heparin ADULT infusion 100 units/mL (25000 units/268m sodium chloride 0.45%), 1,600 Units/hr, Intravenous, Continuous, BRomona Curls [facility] Last Rate: 16 mL/hr at 10/04/19 0700, 1,600 Units/hr at 10/04/19 0700 .  hydrALAZINE (APRESOLINE) injection 20 mg, 20 mg, Intravenous, Q4H PRN, AKipp Brood MD, 20 mg at 10/03/19 0754 .  hydrALAZINE (APRESOLINE) tablet 25 mg, 25 mg, Per Tube, Q8H, Agarwala, Ravi, MD, 25 mg at 10/04/19 0517 .  HYDROmorphone (DILAUDID) tablet 2 mg, 2 mg, Oral, Q6H PRN, AKipp Brood MD, 2 mg at 10/04/19 0322 .  insulin aspart (novoLOG) injection 0-20 Units, 0-20 Units, Subcutaneous, Q4H, BEinar Grad RPH, 3 Units at 10/04/19 0350 .  insulin aspart (novoLOG) injection 5 Units, 5 Units, Subcutaneous, Q4H, AOrvan Seen BGlenice Bow MD, 5 Units at 10/04/19 0801 .  insulin detemir (LEVEMIR) injection 18 Units, 18 Units, Subcutaneous, BID, Icard, Bradley L, DO, 18 Units at 10/03/19 2120 .  ipratropium-albuterol (DUONEB) 0.5-2.5 (3) MG/3ML nebulizer solution 3 mL, 3 mL, Nebulization, Q4H, Atkins, BGlenice Bow MD, 3 mL at 10/04/19 0739 .  magic mouthwash, 5 mL, Oral, TID, Agarwala, Ravi, MD, 5 mL at 10/03/19 2120 .  magnesium hydroxide (MILK OF MAGNESIA) suspension 15 mL, 15 mL, Per Tube, QHS PRN, VPrescott Gum PCollier Salina MD .  MEDLINE mouth rinse, 15 mL, Mouth Rinse, 10 times per day, VPrescott Gum PCollier Salina MD, 15 mL at 10/04/19 0517 .  meropenem (MERREM) 2 g  in sodium chloride 0.9 % 100 mL IVPB, 2 g, Intravenous, Q8H, Bensimhon, DShaune Pascal MD, Last Rate: 200 mL/hr at 10/04/19 0747, 2 g at 10/04/19 0747 .  midazolam (VERSED) injection 5 mg, 5 mg, Intravenous, Q1H PRN, AWonda Olds MD, 5 mg at 09/25/19 0315 .  montelukast (SINGULAIR) tablet 10 mg, 10 mg, Per Tube, QHS, VPrescott Gum PCollier Salina MD, 10 mg at 10/03/19 2116 .  nutrition supplement (JUVEN) (JUVEN) powder packet 1 packet, 1 packet, Per Tube, BID BM, AOrvan Seen BGlenice Bow MD, 1 packet at 10/03/19 1410 .  ondansetron (ZOFRAN) injection 4 mg, 4 mg, Intravenous, Q6H PRN, VPrescott Gum PCollier Salina MD, 4 mg at 09/28/19 0323 .  pantoprazole sodium (PROTONIX) 40 mg/20 mL oral suspension 40 mg, 40 mg, Per Tube, BID, MAudria Nine DO, 40 mg at 10/03/19 2120 .  propranolol (INDERAL) 20 MG/5ML solution 80 mg, 80 mg, Per Tube, TID, AKipp Brood MD, 80 mg at 10/03/19 2119 .  psyllium (HYDROCIL/METAMUCIL) packet 1 packet, 1 packet,  Oral, BID, Kipp Brood, MD, 1 packet at 10/03/19 2120 .  QUEtiapine (SEROQUEL) tablet 50 mg, 50 mg, Per Tube, QHS, Bensimhon, Shaune Pascal, MD, 50 mg at 10/03/19 2116 .  senna-docusate (Senokot-S) tablet 1 tablet, 1 tablet, Per Tube, BID PRN, Prescott Gum, Collier Salina, MD, 1 tablet at 09/25/19 1111 .  sodium chloride (OCEAN) 0.65 % nasal spray 1 spray, 1 spray, Each Nare, PRN, Prescott Gum, Peter, MD .  sodium chloride flush (NS) 0.9 % injection 10-40 mL, 10-40 mL, Intracatheter, Q12H, Orvan Seen, Glenice Bow, MD, 10 mL at 10/01/19 0926 .  sodium chloride flush (NS) 0.9 % injection 10-40 mL, 10-40 mL, Intracatheter, PRN, Orvan Seen, Glenice Bow, MD .  testosterone (ANDROGEL) 50 MG/5GM (1%) gel 5 g, 5 g, Transdermal, Daily, Agarwala, Ravi, MD .  white petrolatum (VASELINE) gel, , Topical, BID, Icard, Bradley L, DO, 0.2 application at 06/10/14 9458  Exam: Current vital signs: BP (!) 163/68   Pulse 94   Temp 98.6 F (37 C) (Other (Comment))   Resp (!) 28   Ht _0  (1.727 m)   Wt 135.3 kg   SpO2 100%    BMI 45.35 kg/m  Vital signs in last 24 hours: Temp:  [98.4 F (36.9 C)-98.8 F (37.1 C)] 98.6 F (37 C) (02/26 0406) Pulse Rate:  [84-103] 94 (02/26 0733) Resp:  [14-37] 28 (02/26 0733) BP: (147-174)/(65-73) 163/68 (02/26 0733) SpO2:  [10 %-100 %] 100 % (02/26 0700) Arterial Line BP: (136-174)/(55-81) 171/81 (02/26 0700) FiO2 (%):  [30 %-40 %] 40 % (02/26 0800) Weight:  [135.3 kg] 135.3 kg (02/26 0500) General: Awake alert does not appear to be in distress.  On ECMO. HEENT: Normocephalic atraumatic, trach in place. CVS and respiratory-on ECMO. Extremities: Nonpitting edema in all fours. Neurological exam Is awake alert. He nods yes and no appropriately to simple questions. He is able to close his eyes to command and stick his tongue out. Cranial nerves: His pupils are mildly asymmetric with the right pupil being slightly bigger than the left but they both react briskly.  Face appears symmetric.  No visual field deficits. Motor exam: He is barely able to shrug his shoulders but not able to move his arms or provide any handgrip at all.  In the lower extremities, he is able to generate 1/5 on bilateral hips for flexion but no other movement appreciated. Sensory exam: Appears intact DTRs: Could not elicit any DTRs.  Toes mute. Unable to elicit coordination.  Unsafe to test gait.  Labs I have reviewed labs in epic and the results pertinent to this consultation are:  CBC    Component Value Date/Time   WBC 8.9 10/04/2019 0340   RBC 2.99 (L) 10/04/2019 0340   HGB 8.5 (L) 10/04/2019 0742   HCT 25.0 (L) 10/04/2019 0742   PLT 95 (L) 10/04/2019 0340   MCV 94.3 10/04/2019 0340   MCH 27.4 10/04/2019 0340   MCHC 29.1 (L) 10/04/2019 0340   RDW 17.4 (H) 10/04/2019 0340   LYMPHSABS 1.8 08/30/2019 0427   MONOABS 0.9 08/30/2019 0427   EOSABS 0.4 08/30/2019 0427   BASOSABS 0.1 08/30/2019 0427    CMP     Component Value Date/Time   NA 144 10/04/2019 0742   K 4.8 10/04/2019 0742   CL  106 10/04/2019 0340   CO2 29 10/04/2019 0340   GLUCOSE 123 (H) 10/04/2019 0340   BUN 82 (H) 10/04/2019 0340   CREATININE 0.68 10/04/2019 0340   CALCIUM 8.5 (L) 10/04/2019 0340  PROT 6.2 (L) 08/31/2019 1150   ALBUMIN 2.2 (L) 08/31/2019 1150   AST 24 08/31/2019 1150   ALT 26 08/31/2019 1150   ALKPHOS 75 08/31/2019 1150   BILITOT 0.7 08/31/2019 1150   GFRNONAA >60 10/04/2019 0340   GFRAA >60 10/04/2019 0340   Imaging I have reviewed the images obtained:  CT-scan of the brain-09/05/2019-reveals pansinusitis but no acute intraparenchymal brain abnormalities.  No bleed.  Assessment:  44 year old with a past medical history of hypertension who was admitted on 07/22/2019 with Covid pneumonia leading to arteries requiring VV femoral femoral ECMO on 08/04/2019 and since being on ECMO, upon being more awake noted to have severe generalized weakness. He has very minimal movement on his shoulders and maybe 1/5 on bilateral hips with severe distal weakness. No reflexes could be elicited. 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 There is no known treatment for this other than limiting sedation and neuromuscular blockade and providing supportive care with the essential functions.  Impression: Critical illness myopathy and neuropathy.  Recommendations: -Ideally, the diagnosis could be confirmed by using EMG nerve conduction and muscle biopsy  studies (EMG/NCS can not be done here -I will check CK levels and correct them if there is any evidence of rhabdo. -Passive ROM exercises by PT OT -Other than that, supportive management per primary team as you are. -I discussed my plan with Dr. Lynetta Mare on the unit.  I will also update Dr. Haroldine Laws. Please call neurology if we can be of any further assistance.  -- Amie Portland, MD [facility] Pager: [phone] If 7pm to 7am, please call on call as listed on AMION.

## 2019-10-04 NOTE — Progress Notes (Signed)
Nursing Progress note  Brief History:  44 year old male that remains on V-V ECMO, cannulated on 08/03/19.  Awake and alert on vent trach. Positive for covid pneumonia in Dec. 2020.    Intake/Output Summary (Last 24 hours) at 10/04/2019 1812 Last data filed at 10/04/2019 1800 Gross per 24 hour  Intake 1104.85 ml  Output 3505 ml  Net -2400.15 ml    . sodium chloride    . sodium chloride Stopped (09/30/19 0734)  . sodium chloride 10 mL/hr at 10/04/19 1800  . dexmedetomidine (PRECEDEX) IV infusion 0.1 mcg/kg/hr (10/04/19 1800)  . heparin 1,650 Units/hr (10/04/19 1800)  . meropenem (MERREM) IV Stopped (10/04/19 1751)    Today's Vitals   10/04/19 1600 10/04/19 1630 10/04/19 1714 10/04/19 1730  BP:   (!) 162/60   Pulse: 89 88  91  Resp: (!) 23 (!) 24  (!) 27  Temp:      TempSrc:      SpO2: 100% 100%  99%  Weight:      Height:      PainSc:         Sodium  Date/Time Value Ref Range Status  10/04/2019 04:35 PM 144 135 - 145 mmol/L Final  10/04/2019 01:06 PM 144 135 - 145 mmol/L Final   Potassium  Date/Time Value Ref Range Status  10/04/2019 04:35 PM 4.7 3.5 - 5.1 mmol/L Final  10/04/2019 01:06 PM 4.6 3.5 - 5.1 mmol/L Final   Chloride  Date/Time Value Ref Range Status  10/04/2019 03:40 AM 106 98 - 111 mmol/L Final  10/03/2019 04:19 PM 106 98 - 111 mmol/L Final   CO2  Date/Time Value Ref Range Status  10/04/2019 03:40 AM 29 22 - 32 mmol/L Final  10/03/2019 04:19 PM 30 22 - 32 mmol/L Final   Glucose, Bld  Date/Time Value Ref Range Status  10/04/2019 03:40 AM 123 (H) 70 - 99 mg/dL Final    Comment:    Glucose reference range applies only to samples taken after fasting for at least 8 hours.  10/03/2019 04:19 PM 60 (L) 70 - 99 mg/dL Final    Comment:    Glucose reference range applies only to samples taken after fasting for at least 8 hours.   BUN  Date/Time Value Ref Range Status  10/04/2019 03:40 AM 82 (H) 6 - 20 mg/dL Final  10/03/2019 04:19 PM 92 (H) 6 - 20  mg/dL Final   Creatinine, Ser  Date/Time Value Ref Range Status  10/04/2019 03:40 AM 0.68 0.61 - 1.24 mg/dL Final  10/03/2019 04:19 PM 0.72 0.61 - 1.24 mg/dL Final     WBC  Date/Time Value Ref Range Status  10/04/2019 04:28 PM 11.9 (H) 4.0 - 10.5 K/uL Final  10/04/2019 03:40 AM 8.9 4.0 - 10.5 K/uL Final   Hemoglobin  Date/Time Value Ref Range Status  10/04/2019 04:35 PM 8.5 (L) 13.0 - 17.0 g/dL Final  10/04/2019 04:28 PM 7.9 (L) 13.0 - 17.0 g/dL Final   Platelets  Date/Time Value Ref Range Status  10/04/2019 04:28 PM 101 (L) 150 - 400 K/uL Final    Comment:    REPEATED TO VERIFY Immature Platelet Fraction may be clinically indicated, consider ordering this additional test [id number] CONSISTENT WITH PREVIOUS RESULT   10/04/2019 03:40 AM 95 (L) 150 - 400 K/uL Final    Comment:    REPEATED TO VERIFY Immature Platelet Fraction may be clinically indicated, consider ordering this additional test [id number] CONSISTENT WITH PREVIOUS RESULT       Nursing  Plan:  Patient tolerated sweep trial for 2 hours and 45 minutes.  CO2 began to climb to mid 60s and pH began dropping to 7.31. Post sweep trial blood gas showed return to baseline.  Patient on minimal sedation and following commands.  Able to track with pupils but unable to move distal extremities.  Neuro consulted to evaluate. Pupils unequal with the right side being larger than the left, neurologist said it was due to the beta agonists breathing treatments.  Per neuro MD impression is critical illness myopathy and neuropathy.  Encouraged OT and nursing staff to help with passive range of motion. Patient is able to move shoulders and hips but cannot move distal extremities on command.  Urine output great with a CVP 4-6, given IV lasix in afternoon for gentle diuresis.  Per dietitian would like to remain with bolus feeds and supplemental nutrition to mimic meal eating and current regimen being handled well with insulin coverage. Mother  called and updated on plan of care, time given and questions answered.

## 2019-10-04 NOTE — Evaluation (Signed)
Occupational Therapy Evaluation Patient Details Name: [name] MRN: [id number] DOB: November 13, 1975 Today's Date: 10/04/2019    History of Present Illness 44 y/o M admitted 12/14 with COVID pneumonia causing acute hypoxemic respiratory failure.   Developed pneumomediastinum 12/23 with concern for possible bilateral small pneumothoraces on 12/24. Pt intubated on 12/26 and started on ECMO on 12/27. Tracheostomy on 12/29 with bleeding from trach an dother sites on 12/31. Bronch on 1/11 and 1/15. Slowly weaning sedation 2/5. Continuing to wean sedation and starting sweep trials to wean ECMO   Clinical Impression   Pt was seen at bed level for exercise. He is able to follow simple motor commands inconsistently and demonstrates a 15-60+ second delay.  He demonstrates trace muscle strength bil. UEs. AAROM/PROM WFL bil. UEs, except shoulders in which ROM limited by lines/tubes, and bil. Hips appear tight.  He requires total  A for all aspects of ADLs and mobility at this time.  VSS throughout session with HR in the 80s, 02 sats >94%, RR mid 20s- low 30s on 40% Fi02 , PEEP 10, and currently on ECMO sweep.  He is severely deconditioned, and will require extensive rehab to regain function.  Disposition will be dependent upon progress and it is much too early to determine this, however, anticipate he may require LTACH. Will follow./     Follow Up Recommendations  LTACH(depending on acute course )    Equipment Recommendations  None recommended by OT    Recommendations for Other Services       Precautions / Restrictions Precautions Precautions: Fall;Other (comment) Precaution Comments: ECMO R IJ, Vent to trach, G tube, necrosis bil. feet  Restrictions Weight Bearing Restrictions: No      Mobility Bed Mobility                  Transfers                      Balance                                           ADL either performed or assessed with clinical  judgement   ADL                                         General ADL Comments: Pt requires total A for all aspects      Vision   Additional Comments: Pt will intermittently track and will fixate on therapiest on command      Perception     Praxis      Pertinent Vitals/Pain Pain Assessment: Faces Faces Pain Scale: No hurt Pain Location: Limbs with PROM     Hand Dominance     Extremity/Trunk Assessment Upper Extremity Assessment Upper Extremity Assessment: RUE deficits/detail;LUE deficits/detail RUE Deficits / Details: moderate edema noted bil. Hands and UEs.  tightness noted MCPs in extension, but able to achieve full PROM.  Rt shoulder limited to ~90* passively due to ECMO line.  Scapula appears to be moving.  able to palpate general contraction of muscles throughout Rt UE when instructed to push me away  with most contraction occuring in tricep.  It is very difficult to accuragely palpate specific muscles as pt not able to follow commands consistently and responds best to  gross motor movement commands  RUE Coordination: decreased gross motor;decreased fine motor LUE Deficits / Details: mod edema noted in hand, tightness noted MCPs and PIPs.  able to achieve full PROM flexion.  PROM of wrist, elbow, WFL, and shoulder limited to ~100 with assist of scapula into upward rotation.  He demonstrates at least 1-1+/5 elbow extension, wrirst flexion and as well as trace shoulder.     Lower Extremity Assessment RLE Deficits / Details: ankle DF limited to -5 degrees bilaterally, knee flexion limited to ~90 degrees, hip flexion limited to ~50 degrees due to pain. Knee extension, abduction and adduction WFL bilaterally LLE Deficits / Details: ankle DF limited to -5 degrees bilaterally, knee flexion limited to ~90 degrees, hip flexion limited to ~50 degrees due to pain. Knee extension, abduction and adduction WFL bilaterally   Cervical / Trunk Assessment Cervical / Trunk  Exceptions: morbid obesity.  Pt moving head/neck somewhat.  Did not perform or encourage ROM of neck due to ECMO lines.  Unable to assist trunk strength and mobility at this time due to lethargy and bed rest    Communication Communication Communication: Tracheostomy   Cognition Arousal/Alertness: Awake/alert Behavior During Therapy: Flat affect Overall Cognitive Status: Impaired/Different from baseline Area of Impairment: Attention;Following commands                   Current Attention Level: Focused   Following Commands: Follows one step commands inconsistently       General Comments: pt will visually fixate on therapist.  He will inconsitently follow simple motor commands with a 15 to >60 second delay to stick at tongue, look at me, push me away with bil. UEs and bil. LEs (trace or more, movement noted of limbs    General Comments  Pt on ECMO sweep today.  He is on 40% Fi02, PEEP of 10 with RR in mid 20's to low 30s  on vent via trach collar.  HR in the 80s     Exercises Exercises: General Upper Extremity;General Lower Extremity General Exercises - Upper Extremity Shoulder Flexion: AAROM;PROM;Right;Left;10 reps;5 reps;Supine Shoulder ABduction: PROM;Both;10 reps;AAROM;Supine Elbow Flexion: PROM;Both;Supine;10 reps Elbow Extension: PROM;Both;15 reps Wrist Flexion: PROM;Both;AAROM;10 reps Wrist Extension: PROM;Both;Supine;AAROM;10 reps Digit Composite Flexion: PROM;10 reps;Both;Supine Composite Extension: PROM;Both;10 reps;Supine General Exercises - Lower Extremity Ankle Circles/Pumps: PROM;Both;10 reps;Supine Heel Slides: PROM;Both;10 reps;Supine Hip ABduction/ADduction: PROM;Both;10 reps;Supine Hip Flexion/Marching: PROM;Both;10 reps;Supine   Shoulder Instructions      Home Living Family/patient expects to be discharged to:: Private residence Living Arrangements: Spouse/significant other Available Help at Discharge: Family Type of Home: House Home Access: Stairs  to enter Technical brewer of Steps: 4 Entrance Stairs-Rails: Right;Left Home Layout: One level     Bathroom Shower/Tub: Occupational psychologist: [place]: None   Additional Comments: history obtained from chart as pt paralyzed and sedation being weaned      Prior Functioning/Environment Level of Independence: Independent        Comments: Pt independent in ADLs, IADLs, and mobility. Pt drives and works at [employer]. Pt does not use oxygen at home.        OT Problem List: Decreased range of motion;Decreased strength;Decreased activity tolerance;Impaired balance (sitting and/or standing);Decreased cognition;Decreased coordination;Decreased safety awareness;Decreased knowledge of use of DME or AE;Decreased knowledge of precautions;Cardiopulmonary status limiting activity;Obesity;Impaired UE functional use;Increased edema      OT Treatment/Interventions: Self-care/ADL training;Therapeutic exercise;Neuromuscular education;Energy conservation;DME and/or AE instruction;Manual therapy;Splinting;Modalities;Therapeutic activities;Cognitive remediation/compensation;Patient/family education;Balance training  OT Goals(Current goals can be found in the care plan section) Acute Rehab OT Goals Patient Stated Goal: pt unable to state, to begin performing purposeful AROM with all extremities and following commands with all extremities OT Goal Formulation: With family Time For Goal Achievement: 10/18/19 Potential to Achieve Goals: Fair ADL Goals Additional ADL Goal #1: Pt will be able to follow one step commands consistently 50% of the time. Additional ADL Goal #2: Pt will sustain attention to activity x 1 min with min cues Additional ADL Goal #3: Pt will be able to move Lt UE to mouth with mod A  OT Frequency: Min 3X/week   Barriers to D/C: Decreased caregiver support  doubt family will be able to provide adequate level of assist initially at  discharge        Co-evaluation              AM-PAC OT "6 Clicks" Daily Activity     Outcome Measure Help from another person eating meals?: Total Help from another person taking care of personal grooming?: Total Help from another person toileting, which includes using toliet, bedpan, or urinal?: Total Help from another person bathing (including washing, rinsing, drying)?: Total Help from another person to put on and taking off regular upper body clothing?: Total Help from another person to put on and taking off regular lower body clothing?: Total 6 Click Score: 6   End of Session Equipment Utilized During Treatment: Other (comment)(vent and ECMO ) Nurse Communication: Mobility status  Activity Tolerance: Patient tolerated treatment well;Patient limited by fatigue Patient left: in bed;with call bell/phone within reach;with nursing/sitter in room  OT Visit Diagnosis: Muscle weakness (generalized) (M62.81);Cognitive communication deficit (R41.841)                Time: 4944-9675 OT Time Calculation (min): 41 min Charges:  OT General Charges $OT Visit: 1 Visit OT Evaluation $OT Eval High Complexity: 1 High OT Treatments $Neuromuscular Re-education: 38-52 mins  Nilsa Nutting., OTR/L Acute Rehabilitation Services Pager [phone] Office [place], [place] 10/04/2019, 12:21 PM

## 2019-10-04 NOTE — Progress Notes (Signed)
NAME:  [name], MRN:  [id number], DOB:  05-Jul-1976, LOS: 19 ADMISSION DATE:  07/22/2019, CONSULTATION DATE:  12/24 REFERRING MD:  Sloan Leiter, CHIEF COMPLAINT:  Dyspnea   Brief History   44 y/o male admitted 12/14 with COVID pneumonia leading to ARDS, pneumomediastinum.  Intubated 12/26, bilateral chest tubes placed.  Past Medical History  GERD HTN Asthma  Significant Hospital Events   12/14 admit with hypoxemic respiratory failure in setting of COVID-19 pneumonia 12/23 noted to have pneumomediastinum on chest x-ray 12/24PCCM consulted for evaluation 12/26 worsening hypoxemia, intubated 12/27 VV fem/fem ECMO cannulation 12/29 tracheostomy >> See bedside nursing event notes for full rundown of procedures after 12/27 1/5 epistaxis, nasal packing 1/30 improved cxr, improved lung compliance, TV 350s on 15PC  1/31 CXR increased infiltrates, +CFB     2/2 Converted to right IJ Crescent cannula. 2/3 PEG tube inserted at bedside 2/14 30 min sweep trial> paO2 105 (18 PEEP, 60% FiO2) pH 7.22, pCO2 87 2/20 2 hours sweep trial 2/21 1.5 hour sweep trial 2/24 7-hour sweep trial.  Increasingly awake.  Consults:  PCCM, CTS, CHF, Palliative  Procedures:  PICC 1/3 >> R radial a-line 1/20 >> R IJ ECMO cath 2/3 >> Tracheostomy 12/29 >>  Significant Diagnostic Tests:  CT chest 12/22>>extensive pneumomediastinum, multifocal patchy bilateral groundglass opacities CXR 1/25 >> improving bilateral infiltrates. ECMO cannulae in place. CXR 2/5 >> clearing bilateral infiltrates  Micro Data:  BCx2 12/14 >>negative  Sputum 12/15 >> OPF 12/28 blood > negative 12/29 > staph aureus 1/4 bronch:  1/2 acid fast smear bronch: negative 1/2 fungal cx bronch: negative1/12 BAL: serratia, staph epidermidisu 1/4 BAL > MSSA, Serratia 1/9 blood > negative 1/9 Resp culture > MSSA, serratia 1/11 serratia > negative 1/12 fungus culture > negative 1/12 BAL > serratia 1/18 BAL >> rare  Serratia. 1/24 blood > negative 1/30 blood > negative 1/30 resp culture > serratia 2/1 BAL >> rare Serratia - enterococcus 2/1 aspergillus Ag > negative 2/2 resp culture > serratia  2/2 blood > negative 2/5 urine > negative 2/9 resp: serratia 2/15 BAL serratia  Antimicrobials:  Received remdesivir, steroids, actemra and convalescent plasma  5 days cefepime 12/24-12/29 Cefepime 1/4->1/13 vanc 1/4> 1/11 Ceftriaxone 1/14 > 1/27 vanc x1 1/20 Cefazolin 1/29 > 2/2 cefuroxim 2/2 vanc 2/2 Ceftriaxone x1 2/4 Meropenem 2/15>>2/21 Tobramycin inhaled 2/15>>2/21  Interim history/subjective:  Continue to be more awake and interactive. Able to move shoulders. Only tolerated 2h of sweep trial today.   Objective   Blood pressure (!) 161/52, pulse 91, temperature 98.6 F (37 C), temperature source Core, resp. rate (!) 29, height _0  (1.727 m), weight 135.3 kg, SpO2 99 %. CVP:  [3 mmHg-4 mmHg] 3 mmHg  Vent Mode: PCV FiO2 (%):  [30 %-40 %] 30 % Set Rate:  [20 bmp] 20 bmp PEEP:  [10 cmH20] 10 cmH20 Plateau Pressure:  [27 cmH20-31 cmH20] 30 cmH20   Intake/Output Summary (Last 24 hours) at 10/04/2019 1343 Last data filed at 10/04/2019 1300 Gross per 24 hour  Intake 1668.44 ml  Output 3046 ml  Net -1377.56 ml   Filed Weights   10/02/19 0500 10/03/19 0500 10/04/19 0500  Weight: 129.4 kg 133.8 kg 135.3 kg    Examination: GEN: ill appearing obese man on vent HEENT: RIJ Crescent cannula. Tracheostomy in place with no bleeding. CV: RRR, ext warm. HS normal PULM: visible respiratory effort. Clear lungs to posterior axillary line. GI: Soft, PEG CDI EXT: Anasarca improved but still +2 edema in lower  extremities NEURO: Opens eyes to voice, tolerating sitting position with minimal assistance. Will orient to voice and turn head trying to readjust head and shoulders to pain nods appropriately to questions. Now shrugging shoulders, still no distal movement PSYCH: RASS 0  SKIN: Pale  Vt  564m Sweep: 3L/min Flow: 5Lmin  Resolved Hospital Problem list   Pneumothoraces-resolved.  Assessment & Plan:   ARDS due to COVID 19, requiring mechanical ventilation and VV ECMO  - Improving, lingering issues with HCAP vs. Bronchitis and profound deconditioning. -Fatigue today following prolonged sweep trial.  Allow rest this morning and resume sweep trial this afternoon. -May benefit from consistent sleep trial time. - Daily sweep trials- will consider overnight trial once able to tolerate 4h.  - Poor lung compliance and diaphragmatic weakness limiting ventilation.   AKI with azotemia - improving  Positive cumulative fluid balance Hypernatremia has corrected Severe third spacing -Decrease free water replacement. - attempting to maintain neg fluid balance   Hypertension - clonidine, amlodipine and propanolol and hydralazine  Serratia pneumonia> treated Stop date 2/22  Neuro muscular weakness Appreciate PT input.  For range of motion exercises. - Trial of testosterone.  Bilateral lower feet deep tissue injury Wound care following  Best practice:  Pain/Anxiety/Delirium protocol (if indicated): off all sedation. Dexmedetomidine to maintain comfort for sweep trial.  VAP protocol (if indicated): Bundle in place. Respiratory support goals: Continue current ultra lung protective strategy.  Daily sweep trials. Consider extended  12-hour and possible decannulation if can tolerate an initial 4 hours. DVT prophylaxis: Heparin drip, goal 0.3-0.5 GI prophylaxis: PPI Nutrition: TF Fluid status goals:  Attempting to maintain negative fluid balance.  Diurese today. Urinary catheter: Guide hemodynamic management Glucose control: controlled with current regimen Mobility/therapy needs: Bedrest.  Daily labs: as per ECMO protocol. Code Status: Full code  Family Communication: Wife updated 2/23 Disposition: ICU  Labs   CBC: CBC Latest Ref Rng & Units 10/04/2019 10/04/2019 10/04/2019   WBC 4.0 - 10.5 K/uL - - -  Hemoglobin 13.0 - 17.0 g/dL 9.5(L) 9.2(L) 9.5(L)  Hematocrit 39.0 - 52.0 % 28.0(L) 27.0(L) 28.0(L)  Platelets 150 - 400 K/uL - - -     Basic Metabolic Panel: BMP Latest Ref Rng & Units 10/04/2019 10/04/2019 10/04/2019  Glucose 70 - 99 mg/dL - - -  BUN 6 - 20 mg/dL - - -  Creatinine 0.61 - 1.24 mg/dL - - -  Sodium 135 - 145 mmol/L 144 145 144  Potassium 3.5 - 5.1 mmol/L 4.6 4.7 4.7  Chloride 98 - 111 mmol/L - - -  CO2 22 - 32 mmol/L - - -  Calcium 8.9 - 10.3 mg/dL - - -     GFR: Estimated Creatinine Clearance: 157 mL/min (by C-G formula based on SCr of 0.68 mg/dL). Recent Labs  Lab 10/01/19 0445 10/01/19 1551 10/02/19 0452 10/02/19 0452 10/02/19 1607 10/03/19 0315 10/03/19 1619 10/04/19 0340  WBC 11.9*   < > 13.9*   < > 13.1* 8.6 10.9* 8.9  LATICACIDVEN 1.2  --  1.1  --   --  1.1  --  1.5   < > = values in this interval not displayed.    Liver Function Tests: No results for input(s): AST, ALT, ALKPHOS, BILITOT, PROT, ALBUMIN in the last 168 hours. No results for input(s): LIPASE, AMYLASE in the last 168 hours. No results for input(s): AMMONIA in the last 168 hours.  ABG    Component Value Date/Time   PHART 7.392 10/04/2019 1306  PCO2ART 50.7 (H) 10/04/2019 1306   PO2ART 71.0 (L) 10/04/2019 1306   HCO3 30.9 (H) 10/04/2019 1306   TCO2 32 10/04/2019 1306   ACIDBASEDEF 4.0 (H) 09/16/2019 2334   O2SAT 94.0 10/04/2019 1306     Coagulation Profile: No results for input(s): INR, PROTIME in the last 168 hours.  Cardiac Enzymes: No results for input(s): CKTOTAL, CKMB, CKMBINDEX, TROPONINI in the last 168 hours.  HbA1C: Hgb A1c MFr Bld  Date/Time Value Ref Range Status  09/23/2019 08:58 AM 5.5 4.8 - 5.6 % Final    Comment:    (NOTE) Pre diabetes:          5.7%-6.4% Diabetes:              >6.4% Glycemic control for   <7.0% adults with diabetes   07/24/2019 04:50 AM 6.7 (H) 4.8 - 5.6 % Final    Comment:    (NOTE) Pre diabetes:           5.7%-6.4% Diabetes:              >6.4% Glycemic control for   <7.0% adults with diabetes     CBG: Recent Labs  Lab 10/03/19 2002 10/03/19 2346 10/04/19 0342 10/04/19 0740 10/04/19 1122  GLUCAP 116* 81 121* 85 178*   This patient is critically ill with multiple organ system failure; which, requires frequent high complexity decision making, assessment, support, evaluation, and titration of therapies. This was completed through the application of advanced monitoring technologies and extensive interpretation of multiple databases. During this encounter critical care time was devoted to patient care services described in this note for 40 minutes.  Kipp Brood, MD [facility] 10/04/2019 1:43 PM

## 2019-10-04 NOTE — Progress Notes (Signed)
Per MD Bartle do not transfuse blood until hemoglobin is less than 7

## 2019-10-04 NOTE — Progress Notes (Signed)
Assessed ECMO cannula site in pt's neck throughout the night. More bleeding has been noted by this RN as well as the ECMO Specialist. The pt has become more awake as the night as progressed & has begun thrashing his head side to side. Pt grimacing and restless- this RN has been providing scheduled and PRN pain medicine as well as titrating precedex gtt for pt comfort(CPOT).  Pt's cannula dressing has dark red blood/clot on it. Dressing is not completely saturated and is still intact. ECMO Specialist and myself continue to assess the site and remind the pt to keep his head still.  D/t the possibly of the entry site begin slightly larger than the cannula (with the pt thrashing his head), and the dressing being stable for now, it was decided to defer the dressing change till day shift in case complications arise with more bleeding or the possibility of getting air sucked into the circuit while changing the dressing.  Pt's VSS & no problems with ECMO circuit.

## 2019-10-04 NOTE — Progress Notes (Signed)
Patient ID: [name], male   DOB: July 28, 1976, 44 y.o.   MRN: [id number]    Advanced Heart Failure Rounding Note   Subjective:    Remains on VV ecmo.  BAL growing > 100K serratia. On meropenum day 12/14  Sweep at 2 On vent at 30% TVs > 450-500cc no change   CXR with bilateral diffuse infiltrates. Unchanged today  Personally reviewed  Awake on vent. Much more alert and responsive but unable to move arms or legs much  Hgb7.9 LDH 355 -> 384 -> 412 -> 413 -> 382 -> 378 Heparin level: 0.29 Lactic acid 1.5  ECMO parameters - see separate ecmo note   Objective:   Weight Range:  Vital Signs:   Temp:  [98.4 F (36.9 C)-98.8 F (37.1 C)] 98.6 F (37 C) (02/26 1500) Pulse Rate:  [63-98] 88 (02/26 1630) Resp:  [18-37] 24 (02/26 1630) BP: (147-181)/(52-83) 162/60 (02/26 1714) SpO2:  [10 %-100 %] 100 % (02/26 1630) Arterial Line BP: (146-178)/(50-81) 159/61 (02/26 1630) FiO2 (%):  [30 %-40 %] 30 % (02/26 1528) Weight:  [135.3 kg] 135.3 kg (02/26 0500) Last BM Date: 10/04/19  Weight change: Filed Weights   10/02/19 0500 10/03/19 0500 10/04/19 0500  Weight: 129.4 kg 133.8 kg 135.3 kg    Intake/Output:   Intake/Output Summary (Last 24 hours) at 10/04/2019 1740 Last data filed at 10/04/2019 1700 Gross per 24 hour  Intake 1133.59 ml  Output 3106 ml  Net -1972.41 ml     Physical Exam: General:  Awake on vent/ECMO. Following commands but unable to move arms and legs much HEENT: normal Neck: supple. RIJ cannula with small pool of dried blood surrounding entry . Carotids 2+ bilat; no bruits. No lymphadenopathy or thryomegaly appreciated. Cor: PMI nondisplaced. Regular rate & rhythm. No rubs, gallops or murmurs. Lungs: clear Abdomen: obese soft, nontender, nondistended. No hepatosplenomegaly. No bruits or masses. Good bowel sounds. Extremities: no cyanosis, clubbing, rash, 1+ edema + feet DTIs Neuro:  Awake on vent/ECMO. Following commands but unable to move arms  and legs much    Telemetry: Sinus 80-90s Personally reviewed   Labs: Basic Metabolic Panel: Recent Labs  Lab 09/30/19 0500 09/30/19 0508 10/01/19 0445 10/01/19 0912 10/02/19 0452 10/02/19 0742 10/02/19 1607 10/02/19 1705 10/03/19 0315 10/03/19 0417 10/03/19 1619 10/03/19 1700 10/04/19 0340 10/04/19 0517 10/04/19 0906 10/04/19 1107 10/04/19 1142 10/04/19 1306 10/04/19 1635  NA 148*   < > 147*   < > 146*   < > 144   < > 145   < > 143   < > 143   < > 145 144 145 144 144  K 4.7   < > 4.5   < > 4.6   < > 4.6   < > 4.6   < > 4.7   < > 4.9   < > 4.5 4.7 4.7 4.6 4.7  CL 106   < > 108   < > 108  --  109  --  108  --  106  --  106  --   --   --   --   --   --   CO2 31   < > 30   < > 29  --  31  --  30  --  30  --  29  --   --   --   --   --   --   GLUCOSE 115*   < > 87   < >  106*  --  114*  --  95  --  60*  --  123*  --   --   --   --   --   --   BUN 88*   < > 85*   < > 86*  --  93*  --  86*  --  92*  --  82*  --   --   --   --   --   --   CREATININE 0.78   < > 0.76   < > 0.69  --  0.66  --  0.69  --  0.72  --  0.68  --   --   --   --   --   --   CALCIUM 8.8*   < > 8.9   < > 8.8*   < > 8.7*   < > 8.7*  --  8.7*  --  8.5*  --   --   --   --   --   --   MG 2.2  --  2.1  --  2.2  --   --   --  2.2  --   --   --  2.2  --   --   --   --   --   --   PHOS 4.3  --  3.8  --  3.6  --   --   --  3.8  --   --   --  3.6  --   --   --   --   --   --    < > = values in this interval not displayed.    Liver Function Tests: No results for input(s): AST, ALT, ALKPHOS, BILITOT, PROT, ALBUMIN in the last 168 hours. No results for input(s): LIPASE, AMYLASE in the last 168 hours. No results for input(s): AMMONIA in the last 168 hours.  CBC: Recent Labs  Lab 10/02/19 1607 10/02/19 1705 10/03/19 0315 10/03/19 0417 10/03/19 1619 10/03/19 1700 10/04/19 0340 10/04/19 0517 10/04/19 1107 10/04/19 1142 10/04/19 1306 10/04/19 1628 10/04/19 1635  WBC 13.1*  --  8.6  --  10.9*  --  8.9  --   --    --   --  11.9*  --   HGB 8.0*   < > 7.4*   < > 8.3*   < > 8.2*   < > 9.5* 9.2* 9.5* 7.9* 8.5*  HCT 28.0*   < > 26.1*   < > 28.5*   < > 28.2*   < > 28.0* 27.0* 28.0* 27.5* 25.0*  MCV 97.2  --  96.0  --  93.8  --  94.3  --   --   --   --  94.8  --   PLT 118*  --  98*  --  100*  --  95*  --   --   --   --  101*  --    < > = values in this interval not displayed.    Cardiac Enzymes: No results for input(s): CKTOTAL, CKMB, CKMBINDEX, TROPONINI in the last 168 hours.  BNP: BNP (last 3 results) Recent Labs    07/22/19 1039  BNP 15.3    ProBNP (last 3 results) No results for input(s): PROBNP in the last 8760 hours.    Other results:  Imaging: DG CHEST PORT 1 VIEW  Result Date: 10/04/2019 CLINICAL DATA:  Tracheostomy.  ECMO. EXAM: PORTABLE CHEST 1 VIEW COMPARISON:  10/03/2019. FINDINGS: Tracheostomy tube and ECMO catheter in stable position. Heart size stable. Diffuse bilateral pulmonary infiltrates again noted without interim change. No pleural effusion or pneumothorax. IMPRESSION: 1.  Tracheostomy tube and ECMO catheter stable position. 2. Diffuse bilateral pulmonary infiltrates again noted without interim change. Electronically Signed   By: Marcello Moores  Register   On: 10/04/2019 08:59   DG CHEST PORT 1 VIEW  Result Date: 10/03/2019 CLINICAL DATA:  Personal history of ECMO, history hypertension, kidney stones, GERD, asthma, pancreatitis EXAM: PORTABLE CHEST 1 VIEW COMPARISON:  Portable exam 0504 hours compared to 10/02/2019 FINDINGS: Tracheostomy tube and ECMO catheter unchanged. Stable heart size. Patchy airspace infiltrates bilaterally consistent with multifocal pneumonia. No pleural effusion or pneumothorax. IMPRESSION: Persistent pulmonary infiltrates consistent with multifocal pneumonia. Electronically Signed   By: Lavonia Dana M.D.   On: 10/03/2019 09:03     Medications:     Scheduled Medications: . sodium chloride   Intravenous Once  . acetaminophen (TYLENOL) oral liquid 160 mg/5  mL  650 mg Per Tube Q6H  . amiodarone  200 mg Oral Daily  . amLODipine  10 mg Oral Daily  . vitamin C  500 mg Per Tube Daily  . chlorhexidine gluconate (MEDLINE KIT)  15 mL Mouth Rinse BID  . Chlorhexidine Gluconate Cloth  6 each Topical Daily  . cloNIDine  0.2 mg Oral Q6H  . feeding supplement (OSMOLITE 1.5 CAL)  237 mL Per Tube 6 X Daily  . feeding supplement (PRO-STAT SUGAR FREE 64)  60 mL Per Tube QID  . free water  300 mL Per Tube Q6H  . Gerhardt's butt cream   Topical QID  . hydrALAZINE  25 mg Per Tube Q8H  . insulin aspart  0-20 Units Subcutaneous Q4H  . insulin aspart  5 Units Subcutaneous Q4H  . insulin detemir  18 Units Subcutaneous BID  . ipratropium-albuterol  3 mL Nebulization Q4H  . magic mouthwash  5 mL Oral TID  . mouth rinse  15 mL Mouth Rinse 10 times per day  . montelukast  10 mg Per Tube QHS  . nutrition supplement (JUVEN)  1 packet Per Tube BID BM  . pantoprazole sodium  40 mg Per Tube BID  . propranolol  80 mg Per Tube TID  . psyllium  1 packet Oral BID  . QUEtiapine  50 mg Per Tube QHS  . sodium chloride flush  10-40 mL Intracatheter Q12H  . testosterone  5 g Transdermal Daily  . white petrolatum   Topical BID    Infusions: . sodium chloride    . sodium chloride Stopped (09/30/19 0734)  . sodium chloride Stopped (10/04/19 0900)  . dexmedetomidine (PRECEDEX) IV infusion 0.1 mcg/kg/hr (10/04/19 1700)  . heparin 1,600 Units/hr (10/04/19 1700)  . meropenem (MERREM) IV 2 g (10/04/19 1720)    PRN Medications: Place/Maintain arterial line **AND** sodium chloride, sodium chloride, sodium chloride, acetaminophen (TYLENOL) oral liquid 160 mg/5 mL, acetaminophen, [COMPLETED] diazepam **FOLLOWED BY** diazepam, hydrALAZINE, HYDROmorphone, magnesium hydroxide, midazolam, ondansetron (ZOFRAN) IV, senna-docusate, sodium chloride, sodium chloride flush   Assessment/Plan:    1. Acute hypoxic/hypercarbic respiratory failure due to COVID PNA: Remained markedly hypoxic  despite full support. Intubated 12/26. S/p bilateral CTs 12/26 for pneumothoraces.  TEE on 12/26 LVEF 70% RV ok. VV ECMO begun 12/26. Tracheostomy 12/29.  COVID ARDS at this point. ECMO circuit changed 1/8 & 1/23. Crescent cannula placed 2/2 (femoral cannuals removed) - ECMO parameters as per ECMO note - Flows look good on [name] - Developed  PNA and sepsis on 2/15.  BAL with > 100K serratia. Finished nebulized tobra. On mero. Today day 12/14 of meropenum - Off all sedation since 2/22. More awake today. Following commands but very weak.  - Neuro has seen and I discussed with them. 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 Continue passive ROM exercises with PT. Supportive care - Continue tdaily sweep trials and autowean of sweep. Sweep at 2. Will be limited due to muscle weakness - Volume status ok - LDH stable  - Continue heparin. Level 0.29. Discussed dosing with PharmD personally. - Continue chest PT  2. Bilateral PTX: Due to barotrauma.  - Resolved. CTs out  3. COVID PNA: management of COVID infection per CCM. CXR with bilateral multifocal PNA progressing to likely ARDS.  - s/p 10 days of remdesivir - 12/16, 12/2 s/p tociluzimab - 12/17 s/p convalescent plasma - Decadron at 53m/daily completed 1/11 - Completed cefepime/vanc - Off Ancef  4. ID: Empiric coverage with vancomycin/cefepime initially for possible secondary bacterial PNA, course completed.   - treated with cefepime and vanc for + cx with staph epidimidis, MSSA and serratia (sensitivities ok). - developed recurrent PNA and sepsis 2/15 - BAL with > 100K serratia. On mero.   - Developed PNA and sepsis on 2/15.  BAL with > 100K serratia. Finished nebulized tobra. On meropenum. Today day 12/14 of meropenum. Watch closely for worsening infection   5. Anemia: - hgb 7.9 this am.Will transfuse as needed to keep Hgb> 8.0  7. FEN - now with G-tube.Continue TFs  8. Hypernatremia - Na 144 - Continue FW at current dose  9. AKI -  Volume status looks good. AKI and azotemia improved with IVF and FW. See discussion above - BUN 82 today  10. DTIs - WOC following. Has evolving wound on left 5th toe. Continue to follow  D/w with ECMO team (ECMO coordinator/specialists, CCM, TCTS and pharmD) at bedside on multidisciplinary rounds. Continue to minimize sedation. Diurese today. Repeat sweep trial. Seems like he may be able to come off next week if continues to progress and mental status improves.   Length of Stay: 759 CRITICAL CARE Performed by: BGlori Bickers Total critical care time: 40 minutes  Critical care time was exclusive of separately billable procedures and treating other patients.  Critical care was necessary to treat or prevent imminent or life-threatening deterioration.  Critical care was time spent personally by me (independent of midlevel providers or residents) on the following activities: development of treatment plan with patient and/or surrogate as well as nursing, discussions with consultants, evaluation of patient's response to treatment, examination of patient, obtaining history from patient or surrogate, ordering and performing treatments and interventions, ordering and review of laboratory studies, ordering and review of radiographic studies, pulse oximetry and re-evaluation of patient's condition.    DGlori BickersMD 10/04/2019, 5:40 PM  Advanced Heart Failure Team Pager [phone](M-F; 7a - 4p)  Please contact [facility] for night-coverage after hours (4p -7a ) and weekends on [email or web address]

## 2019-10-04 NOTE — Plan of Care (Signed)
  Problem: Education: Goal: Knowledge of risk factors and measures for prevention of condition will improve Outcome: Progressing   Problem: Coping: Goal: Psychosocial and spiritual needs will be supported Outcome: Progressing   Problem: Respiratory: Goal: Complications related to the disease process, condition or treatment will be avoided or minimized Outcome: Progressing   Problem: Respiratory: Goal: Ability to maintain a clear airway and adequate ventilation will improve Outcome: Progressing   Problem: Role Relationship: Goal: Method of communication will improve Outcome: Progressing   Problem: Education: Goal: Knowledge of General Education information will improve Description: Including pain rating scale, medication(s)/side effects and non-pharmacologic comfort measures Outcome: Progressing   Problem: Health Behavior/Discharge Planning: Goal: Ability to manage health-related needs will improve Outcome: Progressing   Problem: Clinical Measurements: Goal: Ability to maintain clinical measurements within normal limits will improve Outcome: Progressing Goal: Will remain free from infection Outcome: Progressing Goal: Diagnostic test results will improve Outcome: Progressing Goal: Respiratory complications will improve Outcome: Progressing Goal: Cardiovascular complication will be avoided Outcome: Progressing   Problem: Activity: Goal: Risk for activity intolerance will decrease Outcome: Progressing   Problem: Nutrition: Goal: Adequate nutrition will be maintained Outcome: Progressing   Problem: Coping: Goal: Level of anxiety will decrease Outcome: Progressing   Problem: Elimination: Goal: Will not experience complications related to bowel motility Outcome: Progressing Goal: Will not experience complications related to urinary retention Outcome: Progressing   Problem: Pain Managment: Goal: General experience of comfort will improve Outcome: Progressing    Problem: Safety: Goal: Ability to remain free from injury will improve Outcome: Progressing   Problem: Skin Integrity: Goal: Risk for impaired skin integrity will decrease Outcome: Progressing

## 2019-10-04 NOTE — Consult Note (Signed)
[facility] Nurse Consult Note: Patient receiving care in [facility] 2h04.  I spoke with the patient's primary RN, Aaron Edelman, via telephone. Aaron Edelman was requesting guidance on care of moisture associated skin damage between toes. I have entered the following care instruction:  Gently cleanse between toes.  Cut an Aquacel dressing into narrow strips and weave between toes.  Replace when soiled or moist. Val Riles, RN, MSN, CWOCN, CNS-BC, pager [phone]

## 2019-10-04 NOTE — Progress Notes (Signed)
Patient ID: [name], male   DOB: 1976/06/12, 44 y.o.   MRN: [id number] TCTS Evening Rounds:  Stable on ECMO today. Only tolerated two hrs of sweep trial today. UO remains good on diuretic. -1400 so far today. CVP 6  ABG ok  CBC    Component Value Date/Time   WBC 11.9 (H) 10/04/2019 1628   RBC 2.90 (L) 10/04/2019 1628   HGB 8.5 (L) 10/04/2019 1635   HCT 25.0 (L) 10/04/2019 1635   PLT 101 (L) 10/04/2019 1628   MCV 94.8 10/04/2019 1628   MCH 27.2 10/04/2019 1628   MCHC 28.7 (L) 10/04/2019 1628   RDW 17.2 (H) 10/04/2019 1628   LYMPHSABS 1.8 08/30/2019 0427   MONOABS 0.9 08/30/2019 0427   EOSABS 0.4 08/30/2019 0427   BASOSABS 0.1 08/30/2019 0427   BMET    Component Value Date/Time   NA 144 10/04/2019 1635   K 4.7 10/04/2019 1635   CL 106 10/04/2019 0340   CO2 29 10/04/2019 0340   GLUCOSE 123 (H) 10/04/2019 0340   BUN 82 (H) 10/04/2019 0340   CREATININE 0.68 10/04/2019 0340   CALCIUM 8.5 (L) 10/04/2019 0340   GFRNONAA >60 10/04/2019 0340   GFRAA >60 10/04/2019 0340

## 2019-10-04 NOTE — Progress Notes (Signed)
Sweep trial  Started trial at 0905.  FiO2 on ventilator increased from 30 to 40%. Pt tolerated trial fair.  See ABG results and VS from this time frame.  TV on the ventilator started in the 550 range, but had dropped off to 450-480 by the end of the sweep trial.    Pt's WOB and ABG at 1145 led to stopping at the 2 hr and 45 min mark.  Post ABG can also be found in the results.  Sweep returned to 2 post trial off.    Dr Lynetta Mare notified and after discussion, it was decided to plan for mid morning start to trails over the weekend, with the hope that this would allow him to rest up til this point.    Will pass along to the night ECMO specialist and bedside RN.  Karsten Ro, RN

## 2019-10-05 ENCOUNTER — Inpatient Hospital Stay (HOSPITAL_COMMUNITY): Payer: Managed Care, Other (non HMO)

## 2019-10-05 DIAGNOSIS — J9601 Acute respiratory failure with hypoxia: Secondary | ICD-10-CM

## 2019-10-05 LAB — POCT I-STAT 7, (LYTES, BLD GAS, ICA,H+H)
Acid-Base Excess: 11 mmol/L — ABNORMAL HIGH (ref 0.0–2.0)
Acid-Base Excess: 6 mmol/L — ABNORMAL HIGH (ref 0.0–2.0)
Acid-Base Excess: 7 mmol/L — ABNORMAL HIGH (ref 0.0–2.0)
Acid-Base Excess: 7 mmol/L — ABNORMAL HIGH (ref 0.0–2.0)
Acid-Base Excess: 8 mmol/L — ABNORMAL HIGH (ref 0.0–2.0)
Acid-Base Excess: 9 mmol/L — ABNORMAL HIGH (ref 0.0–2.0)
Bicarbonate: 33 mmol/L — ABNORMAL HIGH (ref 20.0–28.0)
Bicarbonate: 33.9 mmol/L — ABNORMAL HIGH (ref 20.0–28.0)
Bicarbonate: 34 mmol/L — ABNORMAL HIGH (ref 20.0–28.0)
Bicarbonate: 34.6 mmol/L — ABNORMAL HIGH (ref 20.0–28.0)
Bicarbonate: 35.5 mmol/L — ABNORMAL HIGH (ref 20.0–28.0)
Bicarbonate: 36.1 mmol/L — ABNORMAL HIGH (ref 20.0–28.0)
Calcium, Ion: 1.24 mmol/L (ref 1.15–1.40)
Calcium, Ion: 1.26 mmol/L (ref 1.15–1.40)
Calcium, Ion: 1.28 mmol/L (ref 1.15–1.40)
Calcium, Ion: 1.29 mmol/L (ref 1.15–1.40)
Calcium, Ion: 1.3 mmol/L (ref 1.15–1.40)
Calcium, Ion: 1.31 mmol/L (ref 1.15–1.40)
HCT: 24 % — ABNORMAL LOW (ref 39.0–52.0)
HCT: 24 % — ABNORMAL LOW (ref 39.0–52.0)
HCT: 26 % — ABNORMAL LOW (ref 39.0–52.0)
HCT: 26 % — ABNORMAL LOW (ref 39.0–52.0)
HCT: 27 % — ABNORMAL LOW (ref 39.0–52.0)
HCT: 30 % — ABNORMAL LOW (ref 39.0–52.0)
Hemoglobin: 10.2 g/dL — ABNORMAL LOW (ref 13.0–17.0)
Hemoglobin: 8.2 g/dL — ABNORMAL LOW (ref 13.0–17.0)
Hemoglobin: 8.2 g/dL — ABNORMAL LOW (ref 13.0–17.0)
Hemoglobin: 8.8 g/dL — ABNORMAL LOW (ref 13.0–17.0)
Hemoglobin: 8.8 g/dL — ABNORMAL LOW (ref 13.0–17.0)
Hemoglobin: 9.2 g/dL — ABNORMAL LOW (ref 13.0–17.0)
O2 Saturation: 85 %
O2 Saturation: 94 %
O2 Saturation: 94 %
O2 Saturation: 96 %
O2 Saturation: 98 %
O2 Saturation: 98 %
Patient temperature: 36.9
Patient temperature: 37
Patient temperature: 37
Patient temperature: 37
Patient temperature: 37.1
Patient temperature: 37.2
Potassium: 4.3 mmol/L (ref 3.5–5.1)
Potassium: 4.6 mmol/L (ref 3.5–5.1)
Potassium: 4.6 mmol/L (ref 3.5–5.1)
Potassium: 4.6 mmol/L (ref 3.5–5.1)
Potassium: 4.7 mmol/L (ref 3.5–5.1)
Potassium: 4.7 mmol/L (ref 3.5–5.1)
Sodium: 144 mmol/L (ref 135–145)
Sodium: 145 mmol/L (ref 135–145)
Sodium: 146 mmol/L — ABNORMAL HIGH (ref 135–145)
Sodium: 146 mmol/L — ABNORMAL HIGH (ref 135–145)
Sodium: 146 mmol/L — ABNORMAL HIGH (ref 135–145)
Sodium: 146 mmol/L — ABNORMAL HIGH (ref 135–145)
TCO2: 35 mmol/L — ABNORMAL HIGH (ref 22–32)
TCO2: 36 mmol/L — ABNORMAL HIGH (ref 22–32)
TCO2: 36 mmol/L — ABNORMAL HIGH (ref 22–32)
TCO2: 37 mmol/L — ABNORMAL HIGH (ref 22–32)
TCO2: 37 mmol/L — ABNORMAL HIGH (ref 22–32)
TCO2: 38 mmol/L — ABNORMAL HIGH (ref 22–32)
pCO2 arterial: 53.2 mmHg — ABNORMAL HIGH (ref 32.0–48.0)
pCO2 arterial: 53.7 mmHg — ABNORMAL HIGH (ref 32.0–48.0)
pCO2 arterial: 56.5 mmHg — ABNORMAL HIGH (ref 32.0–48.0)
pCO2 arterial: 60.2 mmHg — ABNORMAL HIGH (ref 32.0–48.0)
pCO2 arterial: 63.3 mmHg — ABNORMAL HIGH (ref 32.0–48.0)
pCO2 arterial: 71.2 mmHg (ref 32.0–48.0)
pH, Arterial: 7.294 — ABNORMAL LOW (ref 7.350–7.450)
pH, Arterial: 7.338 — ABNORMAL LOW (ref 7.350–7.450)
pH, Arterial: 7.378 (ref 7.350–7.450)
pH, Arterial: 7.387 (ref 7.350–7.450)
pH, Arterial: 7.397 (ref 7.350–7.450)
pH, Arterial: 7.44 (ref 7.350–7.450)
pO2, Arterial: 103 mmHg (ref 83.0–108.0)
pO2, Arterial: 58 mmHg — ABNORMAL LOW (ref 83.0–108.0)
pO2, Arterial: 77 mmHg — ABNORMAL LOW (ref 83.0–108.0)
pO2, Arterial: 78 mmHg — ABNORMAL LOW (ref 83.0–108.0)
pO2, Arterial: 81 mmHg — ABNORMAL LOW (ref 83.0–108.0)
pO2, Arterial: 96 mmHg (ref 83.0–108.0)

## 2019-10-05 LAB — CBC
HCT: 27.6 % — ABNORMAL LOW (ref 39.0–52.0)
HCT: 28.5 % — ABNORMAL LOW (ref 39.0–52.0)
Hemoglobin: 8.1 g/dL — ABNORMAL LOW (ref 13.0–17.0)
Hemoglobin: 8.1 g/dL — ABNORMAL LOW (ref 13.0–17.0)
MCH: 27.9 pg (ref 26.0–34.0)
MCH: 27.5 pg (ref 26.0–34.0)
MCHC: 29.3 g/dL — ABNORMAL LOW (ref 30.0–36.0)
MCHC: 28.4 g/dL — ABNORMAL LOW (ref 30.0–36.0)
MCV: 95.2 fL (ref 80.0–100.0)
MCV: 96.6 fL (ref 80.0–100.0)
Platelets: 107 10*3/uL — ABNORMAL LOW (ref 150–400)
Platelets: 112 10*3/uL — ABNORMAL LOW (ref 150–400)
RBC: 2.9 MIL/uL — ABNORMAL LOW (ref 4.22–5.81)
RBC: 2.95 MIL/uL — ABNORMAL LOW (ref 4.22–5.81)
RDW: 17.2 % — ABNORMAL HIGH (ref 11.5–15.5)
RDW: 17.8 % — ABNORMAL HIGH (ref 11.5–15.5)
WBC: 10.8 10*3/uL — ABNORMAL HIGH (ref 4.0–10.5)
WBC: 13 10*3/uL — ABNORMAL HIGH (ref 4.0–10.5)
nRBC: 0 % (ref 0.0–0.2)
nRBC: 0.3 % — ABNORMAL HIGH (ref 0.0–0.2)

## 2019-10-05 LAB — BASIC METABOLIC PANEL
Anion gap: 4 — ABNORMAL LOW (ref 5–15)
BUN: 76 mg/dL — ABNORMAL HIGH (ref 6–20)
CO2: 30 mmol/L (ref 22–32)
Calcium: 8.5 mg/dL — ABNORMAL LOW (ref 8.9–10.3)
Chloride: 109 mmol/L (ref 98–111)
Creatinine, Ser: 0.84 mg/dL (ref 0.61–1.24)
GFR calc Af Amer: >60 mL/min (ref >60)
GFR calc non Af Amer: >60 mL/min (ref >60)
Glucose, Bld: 69 mg/dL — ABNORMAL LOW (ref 70–99)
Potassium: 4.6 mmol/L (ref 3.5–5.1)
Sodium: 143 mmol/L (ref 135–145)

## 2019-10-05 LAB — GLUCOSE, CAPILLARY
Glucose-Capillary: 101 mg/dL — ABNORMAL HIGH (ref 70–99)
Glucose-Capillary: 153 mg/dL — ABNORMAL HIGH (ref 70–99)
Glucose-Capillary: 73 mg/dL (ref 70–99)
Glucose-Capillary: 81 mg/dL (ref 70–99)
Glucose-Capillary: 91 mg/dL (ref 70–99)
Glucose-Capillary: 93 mg/dL (ref 70–99)

## 2019-10-05 LAB — HEPARIN LEVEL (UNFRACTIONATED)
Heparin Unfractionated: 0.32 IU/mL (ref 0.30–0.70)
Heparin Unfractionated: 0.31 IU/mL (ref 0.30–0.70)

## 2019-10-05 LAB — APTT
aPTT: 86 seconds — ABNORMAL HIGH (ref 24–36)
aPTT: 78 seconds — ABNORMAL HIGH (ref 24–36)

## 2019-10-05 LAB — LACTIC ACID, PLASMA: Lactic Acid, Venous: 1.1 mmol/L (ref 0.5–1.9)

## 2019-10-05 LAB — COOXEMETRY PANEL
Carboxyhemoglobin: 3 % — ABNORMAL HIGH (ref 0.5–1.5)
Methemoglobin: 0.9 % (ref 0.0–1.5)
O2 Saturation: 84.6 %
Total hemoglobin: 14.8 g/dL (ref 12.0–16.0)

## 2019-10-05 LAB — MAGNESIUM: Magnesium: 2.1 mg/dL (ref 1.7–2.4)

## 2019-10-05 LAB — LACTATE DEHYDROGENASE: LDH: 383 U/L — ABNORMAL HIGH (ref 98–192)

## 2019-10-05 LAB — FIBRINOGEN: Fibrinogen: 338 mg/dL (ref 210–475)

## 2019-10-05 LAB — PHOSPHORUS: Phosphorus: 3.5 mg/dL (ref 2.5–4.6)

## 2019-10-05 MED ORDER — AMLODIPINE BESYLATE 10 MG PO TABS
10.0000 mg | ORAL_TABLET | Freq: Every day | ORAL | Status: DC
  Administered 2019-10-06 – 2019-10-09 (×4): 10 mg
  Filled 2019-10-05 (×5): qty 1

## 2019-10-05 MED ORDER — CLONIDINE HCL 0.2 MG PO TABS
0.2000 mg | ORAL_TABLET | Freq: Four times a day (QID) | ORAL | Status: DC
  Administered 2019-10-05 – 2019-10-12 (×27): 0.2 mg
  Filled 2019-10-05 (×3): qty 1
  Filled 2019-10-05: qty 2
  Filled 2019-10-05 (×15): qty 1
  Filled 2019-10-05: qty 2
  Filled 2019-10-05 (×3): qty 1
  Filled 2019-10-05: qty 2
  Filled 2019-10-05 (×3): qty 1

## 2019-10-05 MED ORDER — HYDROMORPHONE HCL 2 MG PO TABS
2.0000 mg | ORAL_TABLET | Freq: Four times a day (QID) | ORAL | Status: DC | PRN
  Administered 2019-10-05 – 2019-11-14 (×39): 2 mg
  Filled 2019-10-05 (×41): qty 1

## 2019-10-05 MED ORDER — FUROSEMIDE 10 MG/ML IJ SOLN
40.0000 mg | Freq: Once | INTRAMUSCULAR | Status: AC
  Administered 2019-10-05: 40 mg via INTRAVENOUS

## 2019-10-05 MED ORDER — AMIODARONE HCL 200 MG PO TABS
200.0000 mg | ORAL_TABLET | Freq: Every day | ORAL | Status: DC
  Administered 2019-10-06 – 2019-10-12 (×6): 200 mg
  Filled 2019-10-05 (×7): qty 1

## 2019-10-05 MED ORDER — PSYLLIUM 95 % PO PACK
1.0000 | PACK | Freq: Two times a day (BID) | ORAL | Status: DC
  Administered 2019-10-05 – 2019-10-25 (×25): 1
  Filled 2019-10-05 (×41): qty 1

## 2019-10-05 MED ORDER — NICARDIPINE HCL IN NACL 20-0.86 MG/200ML-% IV SOLN
3.0000 mg/h | INTRAVENOUS | Status: DC
  Administered 2019-10-05: 5 mg/h via INTRAVENOUS
  Administered 2019-10-06: 3 mg/h via INTRAVENOUS
  Administered 2019-10-07: 5 mg/h via INTRAVENOUS
  Administered 2019-10-07: 3 mg/h via INTRAVENOUS
  Filled 2019-10-05 (×4): qty 200

## 2019-10-05 MED ORDER — HYDRALAZINE HCL 50 MG PO TABS
50.0000 mg | ORAL_TABLET | Freq: Three times a day (TID) | ORAL | Status: DC
  Administered 2019-10-05 – 2019-10-06 (×3): 50 mg
  Filled 2019-10-05 (×3): qty 1

## 2019-10-05 NOTE — Progress Notes (Signed)
Pharmacy Antibiotic Note  [name] is a 44 y.o. male admitted on 07/22/2019 with pneumonia.  Pharmacy has been consulted for meropenem dosing. Meropenem with more literature in ECMO than cefepime.  WBC today 10.8, afebrile, Scr 0.84 (CrCl >100 mL/min). LA 1.1. No new events noted. Currently on day #13/14 of therapy.  Plan: Meropenem 2 g IV every 8 hours through 2/28 Monitor Scr, clinical pic, cx results   Height: _0  (172.7 cm) Weight: 298 lb 8.1 oz (135.4 kg) IBW/kg (Calculated) : 68.4  Temp (24hrs), Avg:98.5 F (36.9 C), Min:98.4 F (36.9 C), Max:98.6 F (37 C)  Recent Labs  Lab 10/01/19 0445 10/01/19 1551 10/02/19 0452 10/02/19 0452 10/02/19 1607 10/02/19 1607 10/03/19 0315 10/03/19 0315 10/03/19 1619 10/04/19 0340 10/04/19 1628 10/04/19 2007 10/05/19 0355  WBC 11.9*   < > 13.9*   < > 13.1*   < > 8.6   < > 10.9* 8.9 11.9* 11.7* 10.8*  CREATININE 0.76   < > 0.69   < > 0.66  --  0.69  --  0.72 0.68  --   --  0.84  LATICACIDVEN 1.2  --  1.1  --   --   --  1.1  --   --  1.5  --   --  1.1   < > = values in this interval not displayed.    Estimated Creatinine Clearance: 149.5 mL/min (by C-G formula based on SCr of 0.84 mg/dL).    No Known Allergies  Antimicrobials this admission: Remdesivir 12/14>>12/23 Actemra x1 12/16; 12/22 Plasma 12/17 Vanco 12/26 >>12/28, 1/4>>1/13, 2/15>>2/17 Cefepime 12/26 >>12/30, 1/4>>1/13 Meropenem 2/15>>(2/28) Tobi 2/16>>2/22 Ctx 1/15>> (nose packing)1/28 restarted for 1 dose when changed foley 2/5 Ancef nasal packing 1/28>2/3  Dose adjustments this admission: N/A  Microbiology results: 12/14 BCx: ngF 12/14 sputum: Normal respiratory flora  12/25 MRSA PCR neg  12/28 trach: few MSSA 12/28 BCx: ngF 1/2 bronch: >100 k serratia marcescens (R cefazolin), >100 k staph epidermidis (S vanc, tetra) 1/4 BAL: MSSA, serratia marcescens (R cefazolin) 1/9 sputum: serratia/staph 1/9 bldx2 -ng 1/11 BAL: serratia 1/12 BAL:  serratia 1/18 BAL: serratia - S cefepime 1/24 BCx x 2: ngF 1/30 Bcx: ngF 1/30 TA: mod serratia (R cefazolin and CTX) 2/1 BAL: 30k serratia (R- cefazolin), 10k enterococcus 2/2 Sputum: few GNR, rare GPRs 2/2 Bcx x2: ngF 2/2 UCx: ngF 2/5 UCx: ngF 2/9 BAL: mod serratia, rare MSSA 2/15 Bcx x2: ng 2/15 BAL: >100k serratia - only R cefazolin  Thank you for allowing pharmacy to be a part of this patient's care.  Vertis Kelch, PharmD, BCPS PGY2 Cardiology Pharmacy Resident Phone [phone] 10/05/2019       1:41 PM  Please check [email or web address] for unit-specific pharmacist phone numbers

## 2019-10-05 NOTE — Progress Notes (Signed)
Nursing Progress note  Brief History:  45 year old male that remains on V-V ECMO, cannulated on 08/03/19.  Awake and alert on vent trach. Positive for covid pneumonia in Dec. 2020.    Intake/Output Summary (Last 24 hours) at 10/05/2019 1810 Last data filed at 10/05/2019 1800 Gross per 24 hour  Intake 1260.8 ml  Output 5130 ml  Net -3869.2 ml    . sodium chloride    . sodium chloride Stopped (09/30/19 0734)  . sodium chloride Stopped (10/04/19 1810)  . dexmedetomidine (PRECEDEX) IV infusion Stopped (10/05/19 1237)  . heparin 1,650 Units/hr (10/05/19 1800)  . meropenem (MERREM) IV Stopped (10/05/19 1640)  . niCARDipine Stopped (10/05/19 1147)    Today's Vitals   10/05/19 1730 10/05/19 1743 10/05/19 1745 10/05/19 1800  BP:  (!) 170/67    Pulse: 92  91 90  Resp: (!) 31  (!) 31 (!) 32  Temp:      TempSrc:      SpO2: 99%  99% 100%  Weight:      Height:      PainSc:         Sodium  Date/Time Value Ref Range Status  10/05/2019 04:41 PM 146 (H) 135 - 145 mmol/L Final  10/05/2019 12:38 PM 144 135 - 145 mmol/L Final   Potassium  Date/Time Value Ref Range Status  10/05/2019 04:41 PM 4.7 3.5 - 5.1 mmol/L Final  10/05/2019 12:38 PM 4.6 3.5 - 5.1 mmol/L Final   Chloride  Date/Time Value Ref Range Status  10/05/2019 03:55 AM 109 98 - 111 mmol/L Final  10/04/2019 03:40 AM 106 98 - 111 mmol/L Final   CO2  Date/Time Value Ref Range Status  10/05/2019 03:55 AM 30 22 - 32 mmol/L Final  10/04/2019 03:40 AM 29 22 - 32 mmol/L Final   Glucose, Bld  Date/Time Value Ref Range Status  10/05/2019 03:55 AM 69 (L) 70 - 99 mg/dL Final    Comment:    Glucose reference range applies only to samples taken after fasting for at least 8 hours.  10/04/2019 03:40 AM 123 (H) 70 - 99 mg/dL Final    Comment:    Glucose reference range applies only to samples taken after fasting for at least 8 hours.   BUN  Date/Time Value Ref Range Status  10/05/2019 03:55 AM 76 (H) 6 - 20 mg/dL Final   10/04/2019 03:40 AM 82 (H) 6 - 20 mg/dL Final   Creatinine, Ser  Date/Time Value Ref Range Status  10/05/2019 03:55 AM 0.84 0.61 - 1.24 mg/dL Final  10/04/2019 03:40 AM 0.68 0.61 - 1.24 mg/dL Final     WBC  Date/Time Value Ref Range Status  10/05/2019 04:00 PM 13.0 (H) 4.0 - 10.5 K/uL Final  10/05/2019 03:55 AM 10.8 (H) 4.0 - 10.5 K/uL Final   Hemoglobin  Date/Time Value Ref Range Status  10/05/2019 04:41 PM 8.8 (L) 13.0 - 17.0 g/dL Final  10/05/2019 04:00 PM 8.1 (L) 13.0 - 17.0 g/dL Final   Platelets  Date/Time Value Ref Range Status  10/05/2019 04:00 PM 112 (L) 150 - 400 K/uL Final    Comment:    REPEATED TO VERIFY SPECIMEN CHECKED FOR CLOTS Immature Platelet Fraction may be clinically indicated, consider ordering this additional test [id number] CONSISTENT WITH PREVIOUS RESULT   10/05/2019 03:55 AM 107 (L) 150 - 400 K/uL Final    Comment:    Immature Platelet Fraction may be clinically indicated, consider ordering this additional test [id number] CONSISTENT WITH PREVIOUS RESULT  Nursing Plan:  Patient tolerated movement with OT well yesterday and overnight having increased movement to distal extremities.  Slow to follow commands but [place] able to follow commands and track with eyes.  Patient nodding appropriately to questions. Xray worse on the right side compared to 10/04/19. Only tolerated ONE hour of a sweep trial, CO2 increased from 54 to 71 in that hour. During sweep trial patient became hypertensive despite the IV lasix and PRN hydralazine. Bensimhon MD aware and started a Cardene drip to help with pressures during trial. After trial saturations increased back to 99-100, ABG improved with a CO2 of 63. Blood sugars under control with sliding scale and basal insulin. Good urine output but still very edematous in the lower extremities.   Charlsie Quest CCM MD and Candee Furbish AHF MD aware of sweep trial results and stated they may need to bronch tomorrow.  Mother came to  visit and was in good spirits. Planning to set up family call tomorrow at some time.

## 2019-10-05 NOTE — Progress Notes (Signed)
24 Days Post-Op Procedure(s) (LRB): PEG TUBE INSERTION - BEDSIDE (N/A) ESOPHAGOGASTRODUODENOSCOPY (EGD) (N/A) Subjective:  Only tolerated sweep trial for an hour with rise in PCO2 from 53 to 71 and drop in PO2 from 81 to 58. He has been stable on sweep of 2 this week. Tolerated max of 7 hr sweep trial earlier this week.  Reportedly moving legs better today per nurses.  Hgb8.1 LDH 378 -> 383 Heparin level: 0.32 Lactic acid 1.1  Objective: Vital signs in last 24 hours: Temp:  [98.4 F (36.9 C)-98.6 F (37 C)] 98.4 F (36.9 C) (02/27 0800) Pulse Rate:  [63-94] 86 (02/27 1145) Cardiac Rhythm: Normal sinus rhythm (02/27 1200) Resp:  [19-36] 29 (02/27 1145) BP: (125-186)/(6-79) 144/53 (02/27 1137) SpO2:  [10 %-100 %] 99 % (02/27 1145) Arterial Line BP: (118-194)/(44-77) 147/53 (02/27 1145) FiO2 (%):  [30 %-40 %] 40 % (02/27 1134) Weight:  [135.4 kg] 135.4 kg (02/27 0500)  Hemodynamic parameters for last 24 hours: CVP:  [2 mmHg-10 mmHg] 9 mmHg  Intake/Output from previous day: 02/26 0701 - 02/27 0700 In: 1260.2 [I.V.:486.4; NG/GT:474; IV Piggyback:299.9] Out: 6004 [Urine:4505] Intake/Output this shift: Total I/O In: 223.3 [I.V.:123.3; IV Piggyback:100.1] Out: 1675 [Urine:1675]  General appearance: calm on vent. Neurologic: arouses to voice Heart: regular rate and rhythm, S1, S2 normal, no murmur Lungs: rhonchi bilaterally Abdomen: soft,  bowel sounds normal Extremities: edema moderate Cannula site ok  Lab Results: Recent Labs    10/04/19 2007 10/04/19 2014 10/05/19 0355 10/05/19 0419 10/05/19 1019 10/05/19 1128  WBC 11.7*  --  10.8*  --   --   --   HGB 8.2*   < > 8.1*   < > 9.2* 10.2*  HCT 28.8*   < > 27.6*   < > 27.0* 30.0*  PLT 109*  --  107*  --   --   --    < > = values in this interval not displayed.   BMET:  Recent Labs    10/04/19 0340 10/04/19 0517 10/05/19 0355 10/05/19 0419 10/05/19 1019 10/05/19 1128  NA 143   < > 143   < > 146* 146*  K  4.9   < > 4.6   < > 4.3 4.7  CL 106  --  109  --   --   --   CO2 29  --  30  --   --   --   GLUCOSE 123*  --  69*  --   --   --   BUN 82*  --  76*  --   --   --   CREATININE 0.68  --  0.84  --   --   --   CALCIUM 8.5*  --  8.5*  --   --   --    < > = values in this interval not displayed.    PT/INR: No results for input(s): LABPROT, INR in the last 72 hours. ABG    Component Value Date/Time   PHART 7.294 (L) 10/05/2019 1128   HCO3 34.6 (H) 10/05/2019 1128   TCO2 37 (H) 10/05/2019 1128   ACIDBASEDEF 4.0 (H) 09/16/2019 2334   O2SAT 85.0 10/05/2019 1128   CBG (last 3)  Recent Labs    10/05/19 0417 10/05/19 0800 10/05/19 1121  GLUCAP 73 81 153*    Assessment/Plan:  He remains stable on V-V ECMO. He was tolerating sweep trials better earlier in the week but only 1 hr today. He looks more edematous today despite  being -3200 cc yesterday. Continue diuresis. CXR looks a little worse today to me but diffusely so which may be due to edema.    LOS: 75 days    Gaye Pollack 10/05/2019

## 2019-10-05 NOTE — Progress Notes (Signed)
Patient ID: [name], male   DOB: Dec 09, 1975, 44 y.o.   MRN: [id number] TCTS Evening Rounds:  Hemodynamics stable on ECMO with MAP 90's. CVP 8 Circuit working normally  No further sweep trials today. 7.38, 60, 77, 94% tonight.  Continues to diurese  -1360 cc today.  BMET    Component Value Date/Time   NA 146 (H) 10/05/2019 1641   K 4.7 10/05/2019 1641   CL 109 10/05/2019 0355   CO2 30 10/05/2019 0355   GLUCOSE 69 (L) 10/05/2019 0355   BUN 76 (H) 10/05/2019 0355   CREATININE 0.84 10/05/2019 0355   CALCIUM 8.5 (L) 10/05/2019 0355   GFRNONAA >60 10/05/2019 0355   GFRAA >60 10/05/2019 0355   CBC    Component Value Date/Time   WBC 13.0 (H) 10/05/2019 1600   RBC 2.95 (L) 10/05/2019 1600   HGB 8.8 (L) 10/05/2019 1641   HCT 26.0 (L) 10/05/2019 1641   PLT 112 (L) 10/05/2019 1600   MCV 96.6 10/05/2019 1600   MCH 27.5 10/05/2019 1600   MCHC 28.4 (L) 10/05/2019 1600   RDW 17.8 (H) 10/05/2019 1600   LYMPHSABS 1.8 08/30/2019 0427   MONOABS 0.9 08/30/2019 0427   EOSABS 0.4 08/30/2019 0427   BASOSABS 0.1 08/30/2019 0427   Heparin 0.31

## 2019-10-05 NOTE — CV Procedure (Signed)
   ECMO NOTE:  Indication: Respiratory failure due to COVID PNA  Initial cannulation date: 08/03/2019  ECMO type: VV ECMO (Cardio-Help)  Dual lumen  Inflow/return cannula:  1) 32 FR Crescent placed 2/2  ECMO events:  Initial cannulation 12/26 Circuit changed and trach exchanged on 1/8.   Underwent addition of a RIJ drainage cannula on 1/20 Circuit changed on 1/22 for rising LDH Had evidence of recirculation and RIJ cannula pulled back on 1/26 19 FR RIJ removed 09/06/19 Underwent change to [facility] catheter with removal of femoral lines 2/2 2/21 2 hour sweep trial  2/22 Sedation weaned off  2/24 7 hour sweep trial  2/25 3 hours of sweep trial 2/26  2.5 hours of sweep trial    Daily data:  Remains on VV ecmo with good flows. Awake on vent and following commands. Limited by severe myopathy    Flow 4.8 RPM 3550 dP 36 Pven  -66 Sweep  2 SVO2 80.9%  Labs:  Hgb 8.1 LDH 378 -> 383 Heparin level: 0.32 Lactic acid 1.1 BUN 76   Plan:  Crescent cannula working well Excellent flow and sats. Developed recurrent PNA and sepsis 2/15.  Cx with serratia. Continue meropenum today is 3/14 Sedation turned off 2/22. Awake following commands intermittently but very weak. Seen by neuro and has critical care myopathy  Continue to wean support. Sweep at 2 today. Wean as tolerated Vent on minimal settings.  Continue sweep trials.   Diurese today. Sweep trial. PT to do PROM.  Glori Bickers, MD  8:34 AM

## 2019-10-05 NOTE — Progress Notes (Signed)
ANTICOAGULATION CONSULT NOTE - Follow Up Consult  Pharmacy Consult for Heparin Indication: ECMO  Labs: Recent Labs    10/03/19 1619 10/03/19 1700 10/04/19 0340 10/04/19 0517 10/04/19 1628 10/04/19 1635 10/04/19 2007 10/04/19 2007 10/04/19 2014 10/04/19 2014 10/05/19 0355 10/05/19 0356 10/05/19 0419  HGB 8.3*   < > 8.2*   < > 7.9*   < > 8.2*   < > 8.5*   < > 8.1*  --  8.2*  HCT 28.5*   < > 28.2*   < > 27.5*   < > 28.8*   < > 25.0*  --  27.6*  --  24.0*  PLT 100*   < > 95*   < > 101*  --  109*  --   --   --  107*  --   --   APTT 65*   < > 72*  --  68*  --   --   --   --   --  86*  --   --   HEPARINUNFRC 0.25*   < > 0.34  --  0.29*  --   --   --   --   --   --  0.32  --   CREATININE 0.72  --  0.68  --   --   --   --   --   --   --  0.84  --   --    < > = values in this interval not displayed.    Assessment: 44yo male on ECMO.  Heparin level now therapeutic at 0.32 on 1650 units/hr. Hgb 8.1, plt 107 stable. Transfuse when Hgb < 7. LDH and fibrinogen stable. No issues with the infusion reported. Bleeding from trach site resolved.   Goal of Therapy:  Heparin level 0.3-0.4 units/ml   Plan:  Continue IV heparin at 1650 units/hr. Heparin levels and CBC with routine ECMO labs. Monitor for bleeding   Vertis Kelch, PharmD, [facility] PGY2 Cardiology Pharmacy Resident Phone [phone] 10/05/2019       7:12 AM  Please check [email or web address] for unit-specific pharmacist phone numbers

## 2019-10-05 NOTE — Progress Notes (Signed)
NAME:  [name], MRN:  [id number], DOB:  Apr 01, 1976, LOS: 37 ADMISSION DATE:  07/22/2019, CONSULTATION DATE:  12/24 REFERRING MD:  Sloan Leiter, CHIEF COMPLAINT:  Dyspnea   Brief History   44 y/o male admitted 12/14 with COVID pneumonia leading to ARDS, pneumomediastinum.  Intubated 12/26, bilateral chest tubes placed.  Past Medical History  GERD HTN Asthma  Significant Hospital Events   12/14 admit with hypoxemic respiratory failure in setting of COVID-19 pneumonia 12/23 noted to have pneumomediastinum on chest x-ray 12/24PCCM consulted for evaluation 12/26 worsening hypoxemia, intubated 12/27 VV fem/fem ECMO cannulation 12/29 tracheostomy >> See bedside nursing event notes for full rundown of procedures after 12/27 1/5 epistaxis, nasal packing 1/30 improved cxr, improved lung compliance, TV 350s on 15PC  1/31 CXR increased infiltrates, +CFB     2/2 Converted to right IJ Crescent cannula. 2/3 PEG tube inserted at bedside 2/14 30 min sweep trial> paO2 105 (18 PEEP, 60% FiO2) pH 7.22, pCO2 87 2/20 2 hours sweep trial 2/21 1.5 hour sweep trial 2/24 7-hour sweep trial.  Increasingly awake.  Consults:  PCCM, CTS, CHF, Palliative  Procedures:  PICC 1/3 >> R radial a-line 1/20 >> R IJ ECMO cath 2/3 >> Tracheostomy 12/29 >>  Significant Diagnostic Tests:  CT chest 12/22>>extensive pneumomediastinum, multifocal patchy bilateral groundglass opacities CXR 1/25 >> improving bilateral infiltrates. ECMO cannulae in place. CXR 2/5 >> clearing bilateral infiltrates  Micro Data:  BCx2 12/14 >>negative  Sputum 12/15 >> OPF 12/28 blood > negative 12/29 > staph aureus 1/4 bronch:  1/2 acid fast smear bronch: negative 1/2 fungal cx bronch: negative1/12 BAL: serratia, staph epidermidisu 1/4 BAL > MSSA, Serratia 1/9 blood > negative 1/9 Resp culture > MSSA, serratia 1/11 serratia > negative 1/12 fungus culture > negative 1/12 BAL > serratia 1/18 BAL >> rare  Serratia. 1/24 blood > negative 1/30 blood > negative 1/30 resp culture > serratia 2/1 BAL >> rare Serratia - enterococcus 2/1 aspergillus Ag > negative 2/2 resp culture > serratia  2/2 blood > negative 2/5 urine > negative 2/9 resp: serratia 2/15 BAL serratia  Antimicrobials:  Received remdesivir, steroids, actemra and convalescent plasma  5 days cefepime 12/24-12/29 Cefepime 1/4->1/13 vanc 1/4> 1/11 Ceftriaxone 1/14 > 1/27 vanc x1 1/20 Cefazolin 1/29 > 2/2 cefuroxim 2/2 vanc 2/2 Ceftriaxone x1 2/4 Meropenem 2/15>>2/21 Tobramycin inhaled 2/15>>2/21  Interim history/subjective:  Awake, alert.  Following commands, profoundly weak.  Objective   Blood pressure (!) 179/79, pulse 84, temperature 98.6 F (37 C), temperature source Core, resp. rate (!) 30, height _0  (1.727 m), weight 135.4 kg, SpO2 100 %. CVP:  [3 mmHg-6 mmHg] 5 mmHg  Vent Mode: PCV FiO2 (%):  [30 %-40 %] 30 % Set Rate:  [20 bmp] 20 bmp PEEP:  [10 cmH20] 10 cmH20 Plateau Pressure:  [18 cmH20-30 cmH20] 30 cmH20   Intake/Output Summary (Last 24 hours) at 10/05/2019 5784 Last data filed at 10/05/2019 0700 Gross per 24 hour  Intake 1200.62 ml  Output 4305 ml  Net -3104.38 ml   Filed Weights   10/03/19 0500 10/04/19 0500 10/05/19 0500  Weight: 133.8 kg 135.3 kg 135.4 kg    Examination: GEN: ill appearing man on vent HEENT: RIJ catheter CDI CV: RRR, ext warm PULM: Coarse bilaterally, shallow GI: Soft, PEG CDI EXT: Anasarca still present NEURO: follows commands but slowly, profoundly weak PSYCH: RASS 0  SKIN: Pale  Gas/Vent/Sweep/Circuit look okay  Resolved Hospital Problem list   Pneumothoraces-resolved.  Assessment & Plan:   ARDS  due to COVID 19, requiring mechanical ventilation and VV ECMO  - Improving, lingering issues with HCAP vs. Bronchitis and profound deconditioning. - Continue daily sweep trials - Poor lung compliance and diaphragmatic weakness limiting ventilation.   AKI with  azotemia - improving  Positive cumulative fluid balance Hypernatremia has corrected Severe third spacing -Daily discussion regarding diuretics: dose of lasix today  Hypertension - clonidine, amlodipine and propanolol and hydralazine; titrate up hydralazine  Serratia pneumonia> treated Stop date 2/22  Neuro muscular weakness Appreciate PT input.  For range of motion exercises. - Trial of testosterone per previous discussions  Bilateral lower feet deep tissue injury Wound care following  Best practice:  Pain/Anxiety/Delirium protocol (if indicated): off all sedation. Dexmedetomidine to maintain comfort for sweep trial.  VAP protocol (if indicated): Bundle in place. Respiratory support goals: Continue current ultra lung protective strategy.  Daily sweep trials. Consider extended  12-hour and possible decannulation if can tolerate an initial 4 hours. DVT prophylaxis: Heparin drip, goal 0.3-0.5 GI prophylaxis: PPI Nutrition: TF Fluid status goals:  See above Urinary catheter: Guide hemodynamic management Glucose control: controlled with current regimen Mobility/therapy needs: Bedrest.  Daily labs: as per ECMO protocol. Code Status: Full code  Disposition: ICU  Labs   CBC: CBC Latest Ref Rng & Units 10/05/2019 10/05/2019 10/04/2019  WBC 4.0 - 10.5 K/uL - 10.8(H) -  Hemoglobin 13.0 - 17.0 g/dL 8.2(L) 8.1(L) 8.5(L)  Hematocrit 39.0 - 52.0 % 24.0(L) 27.6(L) 25.0(L)  Platelets 150 - 400 K/uL - 107(L) -     Basic Metabolic Panel: BMP Latest Ref Rng & Units 10/05/2019 10/05/2019 10/04/2019  Glucose 70 - 99 mg/dL - 69(L) -  BUN 6 - 20 mg/dL - 76(H) -  Creatinine 0.61 - 1.24 mg/dL - 0.84 -  Sodium 135 - 145 mmol/L 145 143 145  Potassium 3.5 - 5.1 mmol/L 4.6 4.6 4.5  Chloride 98 - 111 mmol/L - 109 -  CO2 22 - 32 mmol/L - 30 -  Calcium 8.9 - 10.3 mg/dL - 8.5(L) -     GFR: Estimated Creatinine Clearance: 149.5 mL/min (by C-G formula based on SCr of 0.84 mg/dL). Recent Labs  Lab  10/02/19 0452 10/02/19 1607 10/03/19 0315 10/03/19 1619 10/04/19 0340 10/04/19 1628 10/04/19 2007 10/05/19 0355  WBC 13.9*   < > 8.6   < > 8.9 11.9* 11.7* 10.8*  LATICACIDVEN 1.1  --  1.1  --  1.5  --   --  1.1   < > = values in this interval not displayed.    Liver Function Tests: No results for input(s): AST, ALT, ALKPHOS, BILITOT, PROT, ALBUMIN in the last 168 hours. No results for input(s): LIPASE, AMYLASE in the last 168 hours. No results for input(s): AMMONIA in the last 168 hours.  ABG    Component Value Date/Time   PHART 7.387 10/05/2019 0419   PCO2ART 56.5 (H) 10/05/2019 0419   PO2ART 103.0 10/05/2019 0419   HCO3 34.0 (H) 10/05/2019 0419   TCO2 36 (H) 10/05/2019 0419   ACIDBASEDEF 4.0 (H) 09/16/2019 2334   O2SAT 84.6 10/05/2019 0430     Coagulation Profile: No results for input(s): INR, PROTIME in the last 168 hours.  Cardiac Enzymes: No results for input(s): CKTOTAL, CKMB, CKMBINDEX, TROPONINI in the last 168 hours.  HbA1C: Hgb A1c MFr Bld  Date/Time Value Ref Range Status  09/23/2019 08:58 AM 5.5 4.8 - 5.6 % Final    Comment:    (NOTE) Pre diabetes:  5.7%-6.4% Diabetes:              >6.4% Glycemic control for   <7.0% adults with diabetes   07/24/2019 04:50 AM 6.7 (H) 4.8 - 5.6 % Final    Comment:    (NOTE) Pre diabetes:          5.7%-6.4% Diabetes:              >6.4% Glycemic control for   <7.0% adults with diabetes     CBG: Recent Labs  Lab 10/04/19 1515 10/04/19 2011 10/04/19 2355 10/05/19 0417 10/05/19 0800  GLUCAP 124* 103* 138* 73 81   This patient is critically ill with multiple organ system failure; which, requires frequent high complexity decision making, assessment, support, evaluation, and titration of therapies. This was completed through the application of advanced monitoring technologies and extensive interpretation of multiple databases. During this encounter critical care time was devoted to patient care services  described in this note for 32 minutes.  Candee Furbish, MD [facility] 10/05/2019 8:19 AM

## 2019-10-05 NOTE — Progress Notes (Signed)
Patient ID: [name], male   DOB: Jan 02, 1976, 44 y.o.   MRN: [id number]    Advanced Heart Failure Rounding Note   Subjective:    Remains on VV ecmo.  BAL growing > 100K serratia. On meropenum day 13/14  Sweep at 2 On vent at 30% TVs > 450-500cc no change   CXR with bilateral diffuse infiltrates. No real change today  Awake on vent and follows commands but unable to move arms well due to ICU myopathy. Moving legs better.   Hgb 8.1 LDH 378 -> 383 Heparin level: 0.32 Lactic acid 1.1 BUN 76   ECMO parameters - see separate ecmo note   Objective:   Weight Range:  Vital Signs:   Temp:  [98.6 F (37 C)] 98.6 F (37 C) (02/26 1500) Pulse Rate:  [63-98] 80 (02/27 0815) Resp:  [19-33] 26 (02/27 0815) BP: (125-186)/(6-83) 186/73 (02/27 0838) SpO2:  [10 %-100 %] 100 % (02/27 0815) Arterial Line BP: (118-194)/(44-76) 175/75 (02/27 0815) FiO2 (%):  [30 %-40 %] 30 % (02/27 0805) Weight:  [135.4 kg] 135.4 kg (02/27 0500) Last BM Date: (P) 10/04/19  Weight change: Filed Weights   10/03/19 0500 10/04/19 0500 10/05/19 0500  Weight: 133.8 kg 135.3 kg 135.4 kg    Intake/Output:   Intake/Output Summary (Last 24 hours) at 10/05/2019 0839 Last data filed at 10/05/2019 0800 Gross per 24 hour  Intake 1220.66 ml  Output 4655 ml  Net -3434.34 ml     Physical Exam: General:  Awake on vent/ECMO. Following commands but unable to move arms much moving legs better HEENT: normal Neck: supple.RIJ cannula ok . Carotids 2+ bilat; no bruits. No lymphadenopathy or thryomegaly appreciated. Cor: PMI nondisplaced. Regular rate & rhythm. No rubs, gallops or murmurs. Lungs: clear Abdomen: obese soft, nontender, nondistended. No hepatosplenomegaly. No bruits or masses. Good bowel sounds. Extremities: no cyanosis, clubbing, rash, 1-2+ edema DTIs on feet Neuro: Following commands but unable to move arms much moving legs better    Telemetry: Sinus 80-90s Personally  reviewed   Labs: Basic Metabolic Panel: Recent Labs  Lab 10/01/19 0445 10/01/19 0912 10/02/19 0452 10/02/19 0742 10/02/19 1607 10/02/19 1705 10/03/19 0315 10/03/19 0417 10/03/19 1619 10/03/19 1700 10/04/19 0340 10/04/19 0517 10/04/19 1306 10/04/19 1635 10/04/19 2014 10/05/19 0355 10/05/19 0419  NA 147*   < > 146*   < > 144   < > 145   < > 143   < > 143   < > 144 144 145 143 145  K 4.5   < > 4.6   < > 4.6   < > 4.6   < > 4.7   < > 4.9   < > 4.6 4.7 4.5 4.6 4.6  CL 108   < > 108   < > 109  --  108  --  106  --  106  --   --   --   --  109  --   CO2 30   < > 29   < > 31  --  30  --  30  --  29  --   --   --   --  30  --   GLUCOSE 87   < > 106*   < > 114*  --  95  --  60*  --  123*  --   --   --   --  69*  --   BUN 85*   < > 86*   < >  93*  --  86*  --  92*  --  82*  --   --   --   --  76*  --   CREATININE 0.76   < > 0.69   < > 0.66  --  0.69  --  0.72  --  0.68  --   --   --   --  0.84  --   CALCIUM 8.9   < > 8.8*   < > 8.7*   < > 8.7*   < > 8.7*  --  8.5*  --   --   --   --  8.5*  --   MG 2.1  --  2.2  --   --   --  2.2  --   --   --  2.2  --   --   --   --  2.1  --   PHOS 3.8  --  3.6  --   --   --  3.8  --   --   --  3.6  --   --   --   --  3.5  --    < > = values in this interval not displayed.    Liver Function Tests: No results for input(s): AST, ALT, ALKPHOS, BILITOT, PROT, ALBUMIN in the last 168 hours. No results for input(s): LIPASE, AMYLASE in the last 168 hours. No results for input(s): AMMONIA in the last 168 hours.  CBC: Recent Labs  Lab 10/03/19 1619 10/03/19 1700 10/04/19 0340 10/04/19 0517 10/04/19 1628 10/04/19 1628 10/04/19 1635 10/04/19 2007 10/04/19 2014 10/05/19 0355 10/05/19 0419  WBC 10.9*  --  8.9  --  11.9*  --   --  11.7*  --  10.8*  --   HGB 8.3*   < > 8.2*   < > 7.9*   < > 8.5* 8.2* 8.5* 8.1* 8.2*  HCT 28.5*   < > 28.2*   < > 27.5*   < > 25.0* 28.8* 25.0* 27.6* 24.0*  MCV 93.8  --  94.3  --  94.8  --   --  94.7  --  95.2  --   PLT  100*  --  95*  --  101*  --   --  109*  --  107*  --    < > = values in this interval not displayed.    Cardiac Enzymes: No results for input(s): CKTOTAL, CKMB, CKMBINDEX, TROPONINI in the last 168 hours.  BNP: BNP (last 3 results) Recent Labs    07/22/19 1039  BNP 15.3    ProBNP (last 3 results) No results for input(s): PROBNP in the last 8760 hours.    Other results:  Imaging: DG CHEST PORT 1 VIEW  Result Date: 10/05/2019 CLINICAL DATA:  ECMO. EXAM: PORTABLE CHEST 1 VIEW COMPARISON:  10/04/2019 FINDINGS: 0513 hours. Tracheostomy tube again noted. ECMO cannula remains in place. Cardiopericardial silhouette is at upper limits of normal for size. Diffuse bilateral airspace disease is not substantially changed in the interval. The visualized bony structures of the thorax are intact. Telemetry leads overlie the chest. IMPRESSION: Stable exam.  No new or progressive interval findings. Electronically Signed   By: Misty Stanley M.D.   On: 10/05/2019 06:49   DG CHEST PORT 1 VIEW  Result Date: 10/04/2019 CLINICAL DATA:  Tracheostomy.  ECMO. EXAM: PORTABLE CHEST 1 VIEW COMPARISON:  10/03/2019. FINDINGS: Tracheostomy tube and ECMO catheter in stable position. Heart size stable. Diffuse bilateral  pulmonary infiltrates again noted without interim change. No pleural effusion or pneumothorax. IMPRESSION: 1.  Tracheostomy tube and ECMO catheter stable position. 2. Diffuse bilateral pulmonary infiltrates again noted without interim change. Electronically Signed   By: Henryetta   On: 10/04/2019 08:59     Medications:     Scheduled Medications: . sodium chloride   Intravenous Once  . acetaminophen (TYLENOL) oral liquid 160 mg/5 mL  650 mg Per Tube Q6H  . amiodarone  200 mg Oral Daily  . amLODipine  10 mg Oral Daily  . vitamin C  500 mg Per Tube Daily  . chlorhexidine gluconate (MEDLINE KIT)  15 mL Mouth Rinse BID  . Chlorhexidine Gluconate Cloth  6 each Topical Daily  . cloNIDine   0.2 mg Oral Q6H  . feeding supplement (OSMOLITE 1.5 CAL)  237 mL Per Tube 6 X Daily  . feeding supplement (PRO-STAT SUGAR FREE 64)  60 mL Per Tube QID  . free water  300 mL Per Tube Q6H  . Gerhardt's butt cream   Topical QID  . hydrALAZINE  50 mg Per Tube Q8H  . insulin aspart  0-20 Units Subcutaneous Q4H  . insulin aspart  5 Units Subcutaneous Q4H  . insulin detemir  18 Units Subcutaneous BID  . ipratropium-albuterol  3 mL Nebulization Q4H  . magic mouthwash  5 mL Oral TID  . mouth rinse  15 mL Mouth Rinse 10 times per day  . montelukast  10 mg Per Tube QHS  . nutrition supplement (JUVEN)  1 packet Per Tube BID BM  . pantoprazole sodium  40 mg Per Tube BID  . propranolol  80 mg Per Tube TID  . psyllium  1 packet Oral BID  . QUEtiapine  50 mg Per Tube QHS  . sodium chloride flush  10-40 mL Intracatheter Q12H  . testosterone  5 g Transdermal Daily  . white petrolatum   Topical BID    Infusions: . sodium chloride    . sodium chloride Stopped (09/30/19 0734)  . sodium chloride Stopped (10/04/19 1810)  . dexmedetomidine (PRECEDEX) IV infusion 0.1 mcg/kg/hr (10/05/19 0800)  . heparin 1,650 Units/hr (10/05/19 0800)  . meropenem (MERREM) IV Stopped (10/05/19 0023)    PRN Medications: Place/Maintain arterial line **AND** sodium chloride, sodium chloride, sodium chloride, acetaminophen (TYLENOL) oral liquid 160 mg/5 mL, acetaminophen, [COMPLETED] diazepam **FOLLOWED BY** diazepam, hydrALAZINE, HYDROmorphone, magnesium hydroxide, midazolam, ondansetron (ZOFRAN) IV, senna-docusate, sodium chloride, sodium chloride flush   Assessment/Plan:    1. Acute hypoxic/hypercarbic respiratory failure due to COVID PNA: Remained markedly hypoxic despite full support. Intubated 12/26. S/p bilateral CTs 12/26 for pneumothoraces.  TEE on 12/26 LVEF 70% RV ok. VV ECMO begun 12/26. Tracheostomy 12/29.  COVID ARDS at this point. ECMO circuit changed 1/8 & 1/23. Crescent cannula placed 2/2 (femoral cannuals  removed) - ECMO parameters as per ECMO note - Flows look good on Crescent - Developed PNA and sepsis on 2/15.  BAL with > 100K serratia. Finished nebulized tobra. On mero. Today day 13/14 of meropenum - Neuro has seen and I discussed with them. 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 Continue passive ROM exercises with PT. Supportive care - Continue daily sweep trials and autowean of sweep. Sweep at 2. Will be limited due to muscle weakness - Will give lasix today and also hydralazine and then do sweep trial - LDH stable  - Continue heparin. Level 0.32 Discussed dosing with PharmD personally. - Continue chest PT  2. Bilateral PTX: Due to barotrauma.  -  Resolved. CTs out  3. COVID PNA: management of COVID infection per CCM. CXR with bilateral multifocal PNA progressing to likely ARDS.  - s/p 10 days of remdesivir - 12/16, 12/2 s/p tociluzimab - 12/17 s/p convalescent plasma - Decadron at 3m/daily completed 1/11 - Completed cefepime/vanc - Off Ancef  4. ID: Empiric coverage with vancomycin/cefepime initially for possible secondary bacterial PNA, course completed.   - treated with cefepime and vanc for + cx with staph epidimidis, MSSA and serratia (sensitivities ok). - developed recurrent PNA and sepsis 2/15 - BAL with > 100K serratia. On mero.   - Developed PNA and sepsis on 2/15.  BAL with > 100K serratia. Finished nebulized tobra. On meropenum. Today day 13/14 of meropenum. Watch closely for worsening infection once abx off as he had been colonized with serratia  5. Anemia: - hgb 8.2 this am.Will transfuse as needed to keep Hgb> 8.0  7. FEN - now with G-tube.Continue TFs  8. Hypernatremia - Na 143 - Continue FW at current dose  9. AKI - Volume status up slightly. AKI and azotemia improved with IVF and FW. See discussion above - BUN 82 -> 69 today  10. DTIs - WOC following. Has evolving wound on left 5th toe. Continue to follow  Diurese today. Sweep trial. PT to do PROM.  Case discussed in detail in multidisciplinary ECMO rounds.  Length of Stay: 754 CRITICAL CARE Performed by: BGlori Bickers Total critical care time: 35 minutes  Critical care time was exclusive of separately billable procedures and treating other patients.  Critical care was necessary to treat or prevent imminent or life-threatening deterioration.  Critical care was time spent personally by me (independent of midlevel providers or residents) on the following activities: development of treatment plan with patient and/or surrogate as well as nursing, discussions with consultants, evaluation of patient's response to treatment, examination of patient, obtaining history from patient or surrogate, ordering and performing treatments and interventions, ordering and review of laboratory studies, ordering and review of radiographic studies, pulse oximetry and re-evaluation of patient's condition.    DGlori BickersMD 10/05/2019, 8:39 AM  Advanced Heart Failure Team Pager [phone](M-F; 7a - 4p)  Please contact [facility] for night-coverage after hours (4p -7a ) and weekends on [email or web address]

## 2019-10-05 NOTE — Progress Notes (Signed)
ANTICOAGULATION CONSULT NOTE - Follow Up Consult  Pharmacy Consult for Heparin Indication: ECMO  Labs: Recent Labs    10/03/19 1619 10/03/19 1700 10/04/19 0340 10/04/19 0517 10/04/19 1628 10/04/19 1635 10/04/19 2007 10/04/19 2014 10/05/19 0355 10/05/19 0356 10/05/19 0419 10/05/19 1238 10/05/19 1238 10/05/19 1600 10/05/19 1641  HGB 8.3*   < > 8.2*   < > 7.9*   < > 8.2*   < > 8.1*  --    < > 8.8*   < > 8.1* 8.8*  HCT 28.5*   < > 28.2*   < > 27.5*   < > 28.8*   < > 27.6*  --    < > 26.0*  --  28.5* 26.0*  PLT 100*   < > 95*   < > 101*   < > 109*  --  107*  --   --   --   --  112*  --   APTT 65*   < > 72*   < > 68*  --   --   --  86*  --   --   --   --  78*  --   HEPARINUNFRC 0.25*   < > 0.34   < > 0.29*  --   --   --   --  0.32  --   --   --  0.31  --   CREATININE 0.72  --  0.68  --   --   --   --   --  0.84  --   --   --   --   --   --    < > = values in this interval not displayed.    Assessment: 44yo male on ECMO.  Heparin level this evening came back therapeutic at 0.31, on 1650 units/hr. Hgb stable at 8.8, plt stable at 112 (trending up from earlier slightly). No s/sx of bleeding or infusion issues per RN - dressing site changed with minimal new bleeding.   Transfuse when Hgb < 7. LDH and fibrinogen stable.   Goal of Therapy:  Heparin level 0.3-0.4 units/ml   Plan:  Continue IV heparin at 1650 units/hr. Heparin levels and CBC with routine ECMO labs. Monitor for bleeding  Antonietta Jewel, PharmD, BCCCP Clinical Pharmacist  Phone: [phone]  Please check AMION for all [facility] phone numbers After 10:00 PM, call [place] [phone] 10/05/2019       5:45 PM

## 2019-10-05 NOTE — Plan of Care (Signed)
  Problem: Education: Goal: Knowledge of risk factors and measures for prevention of condition will improve Outcome: Progressing   Problem: Coping: Goal: Psychosocial and spiritual needs will be supported Outcome: Progressing   Problem: Respiratory: Goal: Complications related to the disease process, condition or treatment will be avoided or minimized Outcome: Progressing   Problem: Respiratory: Goal: Ability to maintain a clear airway and adequate ventilation will improve Outcome: Progressing   Problem: Role Relationship: Goal: Method of communication will improve Outcome: Progressing   Problem: Education: Goal: Knowledge of General Education information will improve Description: Including pain rating scale, medication(s)/side effects and non-pharmacologic comfort measures Outcome: Progressing   Problem: Health Behavior/Discharge Planning: Goal: Ability to manage health-related needs will improve Outcome: Progressing   Problem: Clinical Measurements: Goal: Ability to maintain clinical measurements within normal limits will improve Outcome: Progressing Goal: Will remain free from infection Outcome: Progressing Goal: Diagnostic test results will improve Outcome: Progressing Goal: Respiratory complications will improve Outcome: Progressing Goal: Cardiovascular complication will be avoided Outcome: Progressing   Problem: Activity: Goal: Risk for activity intolerance will decrease Outcome: Progressing   Problem: Nutrition: Goal: Adequate nutrition will be maintained Outcome: Progressing   Problem: Coping: Goal: Level of anxiety will decrease Outcome: Progressing   Problem: Elimination: Goal: Will not experience complications related to bowel motility Outcome: Progressing Goal: Will not experience complications related to urinary retention Outcome: Progressing   Problem: Pain Managment: Goal: General experience of comfort will improve Outcome: Progressing    Problem: Safety: Goal: Ability to remain free from injury will improve Outcome: Progressing   Problem: Skin Integrity: Goal: Risk for impaired skin integrity will decrease Outcome: Progressing

## 2019-10-05 NOTE — Progress Notes (Signed)
Started trial at 1025.  FiO2 on ventilator increased from 30 to 40%. Pt tolerated trial fair. Increased WOB and ABG obtained at 1 hour led to stopping the sweep trial.  Sweep returned to 2 and vent FIO2 returned to 30%.    Dr. Tamala Julian and Dr. Haroldine Laws notified.     Results for [name], [name] (MRN [id number]) as of 10/05/2019 11:51  Ref. Range 10/05/2019 10:19 10/05/2019 11:21 10/05/2019 11:28  Sample type Unknown ARTERIAL  ARTERIAL  pH, Arterial Latest Ref Range: 7.350 - 7.450  7.397  7.294 (L)  pCO2 arterial Latest Ref Range: 32.0 - 48.0 mmHg 53.7 (H)  71.2 (HH)  pO2, Arterial Latest Ref Range: 83.0 - 108.0 mmHg 81.0 (L)  58.0 (L)  TCO2 Latest Ref Range: 22 - 32 mmol/L 35 (H)  37 (H)  Acid-Base Excess Latest Ref Range: 0.0 - 2.0 mmol/L 7.0 (H)  6.0 (H)  Bicarbonate Latest Ref Range: 20.0 - 28.0 mmol/L 33.0 (H)  34.6 (H)  O2 Saturation Latest Units: % 96.0  85.0  Patient temperature Unknown 37.0 C  37.1 C  Sodium Latest Ref Range: 135 - 145 mmol/L 146 (H)  146 (H)  Potassium Latest Ref Range: 3.5 - 5.1 mmol/L 4.3  4.7  Calcium Ionized Latest Ref Range: 1.15 - 1.40 mmol/L 1.24  1.29  Hemoglobin Latest Ref Range: 13.0 - 17.0 g/dL 9.2 (L)  10.2 (L)  HCT Latest Ref Range: 39.0 - 52.0 % 27.0 (L)  30.0 (L)

## 2019-10-06 ENCOUNTER — Inpatient Hospital Stay (HOSPITAL_COMMUNITY): Payer: Managed Care, Other (non HMO)

## 2019-10-06 LAB — GLUCOSE, CAPILLARY
Glucose-Capillary: 74 mg/dL (ref 70–99)
Glucose-Capillary: 83 mg/dL (ref 70–99)
Glucose-Capillary: 103 mg/dL — ABNORMAL HIGH (ref 70–99)
Glucose-Capillary: 105 mg/dL — ABNORMAL HIGH (ref 70–99)
Glucose-Capillary: 118 mg/dL — ABNORMAL HIGH (ref 70–99)
Glucose-Capillary: 157 mg/dL — ABNORMAL HIGH (ref 70–99)

## 2019-10-06 LAB — POCT I-STAT 7, (LYTES, BLD GAS, ICA,H+H)
Acid-Base Excess: 10 mmol/L — ABNORMAL HIGH (ref 0.0–2.0)
Acid-Base Excess: 11 mmol/L — ABNORMAL HIGH (ref 0.0–2.0)
Acid-Base Excess: 9 mmol/L — ABNORMAL HIGH (ref 0.0–2.0)
Acid-Base Excess: 9 mmol/L — ABNORMAL HIGH (ref 0.0–2.0)
Acid-Base Excess: 10 mmol/L — ABNORMAL HIGH (ref 0.0–2.0)
Acid-Base Excess: 11 mmol/L — ABNORMAL HIGH (ref 0.0–2.0)
Acid-Base Excess: 13 mmol/L — ABNORMAL HIGH (ref 0.0–2.0)
Acid-Base Excess: 9 mmol/L — ABNORMAL HIGH (ref 0.0–2.0)
Acid-Base Excess: 9 mmol/L — ABNORMAL HIGH (ref 0.0–2.0)
Acid-Base Excess: 9 mmol/L — ABNORMAL HIGH (ref 0.0–2.0)
Acid-Base Excess: 9 mmol/L — ABNORMAL HIGH (ref 0.0–2.0)
Acid-Base Excess: 10 mmol/L — ABNORMAL HIGH (ref 0.0–2.0)
Acid-Base Excess: 11 mmol/L — ABNORMAL HIGH (ref 0.0–2.0)
Acid-Base Excess: 8 mmol/L — ABNORMAL HIGH (ref 0.0–2.0)
Bicarbonate: 35.2 mmol/L — ABNORMAL HIGH (ref 20.0–28.0)
Bicarbonate: 36 mmol/L — ABNORMAL HIGH (ref 20.0–28.0)
Bicarbonate: 34.7 mmol/L — ABNORMAL HIGH (ref 20.0–28.0)
Bicarbonate: 35.9 mmol/L — ABNORMAL HIGH (ref 20.0–28.0)
Bicarbonate: 36.8 mmol/L — ABNORMAL HIGH (ref 20.0–28.0)
Bicarbonate: 37.3 mmol/L — ABNORMAL HIGH (ref 20.0–28.0)
Bicarbonate: 39 mmol/L — ABNORMAL HIGH (ref 20.0–28.0)
Bicarbonate: 36.2 mmol/L — ABNORMAL HIGH (ref 20.0–28.0)
Bicarbonate: 36.6 mmol/L — ABNORMAL HIGH (ref 20.0–28.0)
Bicarbonate: 35.6 mmol/L — ABNORMAL HIGH (ref 20.0–28.0)
Bicarbonate: 36.1 mmol/L — ABNORMAL HIGH (ref 20.0–28.0)
Bicarbonate: 36.4 mmol/L — ABNORMAL HIGH (ref 20.0–28.0)
Bicarbonate: 37.1 mmol/L — ABNORMAL HIGH (ref 20.0–28.0)
Bicarbonate: 35.3 mmol/L — ABNORMAL HIGH (ref 20.0–28.0)
Calcium, Ion: 1.29 mmol/L (ref 1.15–1.40)
Calcium, Ion: 1.25 mmol/L (ref 1.15–1.40)
Calcium, Ion: 1.28 mmol/L (ref 1.15–1.40)
Calcium, Ion: 1.27 mmol/L (ref 1.15–1.40)
Calcium, Ion: 1.27 mmol/L (ref 1.15–1.40)
Calcium, Ion: 1.26 mmol/L (ref 1.15–1.40)
Calcium, Ion: 1.23 mmol/L (ref 1.15–1.40)
Calcium, Ion: 1.29 mmol/L (ref 1.15–1.40)
Calcium, Ion: 1.3 mmol/L (ref 1.15–1.40)
Calcium, Ion: 1.29 mmol/L (ref 1.15–1.40)
Calcium, Ion: 1.3 mmol/L (ref 1.15–1.40)
Calcium, Ion: 1.27 mmol/L (ref 1.15–1.40)
Calcium, Ion: 1.28 mmol/L (ref 1.15–1.40)
Calcium, Ion: 1.29 mmol/L (ref 1.15–1.40)
HCT: 24 % — ABNORMAL LOW (ref 39.0–52.0)
HCT: 29 % — ABNORMAL LOW (ref 39.0–52.0)
HCT: 28 % — ABNORMAL LOW (ref 39.0–52.0)
HCT: 27 % — ABNORMAL LOW (ref 39.0–52.0)
HCT: 27 % — ABNORMAL LOW (ref 39.0–52.0)
HCT: 28 % — ABNORMAL LOW (ref 39.0–52.0)
HCT: 26 % — ABNORMAL LOW (ref 39.0–52.0)
HCT: 27 % — ABNORMAL LOW (ref 39.0–52.0)
HCT: 26 % — ABNORMAL LOW (ref 39.0–52.0)
HCT: 28 % — ABNORMAL LOW (ref 39.0–52.0)
HCT: 26 % — ABNORMAL LOW (ref 39.0–52.0)
HCT: 26 % — ABNORMAL LOW (ref 39.0–52.0)
HCT: 26 % — ABNORMAL LOW (ref 39.0–52.0)
HCT: 23 % — ABNORMAL LOW (ref 39.0–52.0)
Hemoglobin: 8.2 g/dL — ABNORMAL LOW (ref 13.0–17.0)
Hemoglobin: 9.9 g/dL — ABNORMAL LOW (ref 13.0–17.0)
Hemoglobin: 9.5 g/dL — ABNORMAL LOW (ref 13.0–17.0)
Hemoglobin: 9.2 g/dL — ABNORMAL LOW (ref 13.0–17.0)
Hemoglobin: 9.2 g/dL — ABNORMAL LOW (ref 13.0–17.0)
Hemoglobin: 9.5 g/dL — ABNORMAL LOW (ref 13.0–17.0)
Hemoglobin: 8.8 g/dL — ABNORMAL LOW (ref 13.0–17.0)
Hemoglobin: 9.2 g/dL — ABNORMAL LOW (ref 13.0–17.0)
Hemoglobin: 8.8 g/dL — ABNORMAL LOW (ref 13.0–17.0)
Hemoglobin: 9.5 g/dL — ABNORMAL LOW (ref 13.0–17.0)
Hemoglobin: 8.8 g/dL — ABNORMAL LOW (ref 13.0–17.0)
Hemoglobin: 8.8 g/dL — ABNORMAL LOW (ref 13.0–17.0)
Hemoglobin: 8.8 g/dL — ABNORMAL LOW (ref 13.0–17.0)
Hemoglobin: 7.8 g/dL — ABNORMAL LOW (ref 13.0–17.0)
O2 Saturation: 98 %
O2 Saturation: 97 %
O2 Saturation: 92 %
O2 Saturation: 92 %
O2 Saturation: 94 %
O2 Saturation: 93 %
O2 Saturation: 94 %
O2 Saturation: 92 %
O2 Saturation: 92 %
O2 Saturation: 94 %
O2 Saturation: 94 %
O2 Saturation: 94 %
O2 Saturation: 94 %
O2 Saturation: 94 %
Patient temperature: 37
Patient temperature: 37.1
Patient temperature: 37
Patient temperature: 37
Patient temperature: 37.1
Patient temperature: 37
Patient temperature: 37
Patient temperature: 37
Patient temperature: 37
Patient temperature: 37
Patient temperature: 37
Patient temperature: 37
Patient temperature: 37
Patient temperature: 37
Potassium: 4.5 mmol/L (ref 3.5–5.1)
Potassium: 4.2 mmol/L (ref 3.5–5.1)
Potassium: 4.5 mmol/L (ref 3.5–5.1)
Potassium: 4.5 mmol/L (ref 3.5–5.1)
Potassium: 4.5 mmol/L (ref 3.5–5.1)
Potassium: 4.7 mmol/L (ref 3.5–5.1)
Potassium: 4.7 mmol/L (ref 3.5–5.1)
Potassium: 4.6 mmol/L (ref 3.5–5.1)
Potassium: 4.6 mmol/L (ref 3.5–5.1)
Potassium: 4.7 mmol/L (ref 3.5–5.1)
Potassium: 4.6 mmol/L (ref 3.5–5.1)
Potassium: 4.7 mmol/L (ref 3.5–5.1)
Potassium: 4.8 mmol/L (ref 3.5–5.1)
Potassium: 4.4 mmol/L (ref 3.5–5.1)
Sodium: 146 mmol/L — ABNORMAL HIGH (ref 135–145)
Sodium: 146 mmol/L — ABNORMAL HIGH (ref 135–145)
Sodium: 147 mmol/L — ABNORMAL HIGH (ref 135–145)
Sodium: 146 mmol/L — ABNORMAL HIGH (ref 135–145)
Sodium: 147 mmol/L — ABNORMAL HIGH (ref 135–145)
Sodium: 146 mmol/L — ABNORMAL HIGH (ref 135–145)
Sodium: 146 mmol/L — ABNORMAL HIGH (ref 135–145)
Sodium: 147 mmol/L — ABNORMAL HIGH (ref 135–145)
Sodium: 147 mmol/L — ABNORMAL HIGH (ref 135–145)
Sodium: 147 mmol/L — ABNORMAL HIGH (ref 135–145)
Sodium: 146 mmol/L — ABNORMAL HIGH (ref 135–145)
Sodium: 147 mmol/L — ABNORMAL HIGH (ref 135–145)
Sodium: 147 mmol/L — ABNORMAL HIGH (ref 135–145)
Sodium: 146 mmol/L — ABNORMAL HIGH (ref 135–145)
TCO2: 37 mmol/L — ABNORMAL HIGH (ref 22–32)
TCO2: 38 mmol/L — ABNORMAL HIGH (ref 22–32)
TCO2: 36 mmol/L — ABNORMAL HIGH (ref 22–32)
TCO2: 38 mmol/L — ABNORMAL HIGH (ref 22–32)
TCO2: 39 mmol/L — ABNORMAL HIGH (ref 22–32)
TCO2: 39 mmol/L — ABNORMAL HIGH (ref 22–32)
TCO2: 41 mmol/L — ABNORMAL HIGH (ref 22–32)
TCO2: 38 mmol/L — ABNORMAL HIGH (ref 22–32)
TCO2: 39 mmol/L — ABNORMAL HIGH (ref 22–32)
TCO2: 38 mmol/L — ABNORMAL HIGH (ref 22–32)
TCO2: 38 mmol/L — ABNORMAL HIGH (ref 22–32)
TCO2: 38 mmol/L — ABNORMAL HIGH (ref 22–32)
TCO2: 39 mmol/L — ABNORMAL HIGH (ref 22–32)
TCO2: 37 mmol/L — ABNORMAL HIGH (ref 22–32)
pCO2 arterial: 50.7 mmHg — ABNORMAL HIGH (ref 32.0–48.0)
pCO2 arterial: 52.3 mmHg — ABNORMAL HIGH (ref 32.0–48.0)
pCO2 arterial: 54.8 mmHg — ABNORMAL HIGH (ref 32.0–48.0)
pCO2 arterial: 61.2 mmHg — ABNORMAL HIGH (ref 32.0–48.0)
pCO2 arterial: 62 mmHg — ABNORMAL HIGH (ref 32.0–48.0)
pCO2 arterial: 61.9 mmHg — ABNORMAL HIGH (ref 32.0–48.0)
pCO2 arterial: 60 mmHg — ABNORMAL HIGH (ref 32.0–48.0)
pCO2 arterial: 62.1 mmHg — ABNORMAL HIGH (ref 32.0–48.0)
pCO2 arterial: 67.7 mmHg (ref 32.0–48.0)
pCO2 arterial: 62 mmHg — ABNORMAL HIGH (ref 32.0–48.0)
pCO2 arterial: 62.1 mmHg — ABNORMAL HIGH (ref 32.0–48.0)
pCO2 arterial: 57.9 mmHg — ABNORMAL HIGH (ref 32.0–48.0)
pCO2 arterial: 56.4 mmHg — ABNORMAL HIGH (ref 32.0–48.0)
pCO2 arterial: 65.3 mmHg (ref 32.0–48.0)
pH, Arterial: 7.449 (ref 7.350–7.450)
pH, Arterial: 7.447 (ref 7.350–7.450)
pH, Arterial: 7.41 (ref 7.350–7.450)
pH, Arterial: 7.377 (ref 7.350–7.450)
pH, Arterial: 7.382 (ref 7.350–7.450)
pH, Arterial: 7.388 (ref 7.350–7.450)
pH, Arterial: 7.421 (ref 7.350–7.450)
pH, Arterial: 7.374 (ref 7.350–7.450)
pH, Arterial: 7.341 — ABNORMAL LOW (ref 7.350–7.450)
pH, Arterial: 7.367 (ref 7.350–7.450)
pH, Arterial: 7.373 (ref 7.350–7.450)
pH, Arterial: 7.406 (ref 7.350–7.450)
pH, Arterial: 7.426 (ref 7.350–7.450)
pH, Arterial: 7.341 — ABNORMAL LOW (ref 7.350–7.450)
pO2, Arterial: 106 mmHg (ref 83.0–108.0)
pO2, Arterial: 88 mmHg (ref 83.0–108.0)
pO2, Arterial: 65 mmHg — ABNORMAL LOW (ref 83.0–108.0)
pO2, Arterial: 68 mmHg — ABNORMAL LOW (ref 83.0–108.0)
pO2, Arterial: 75 mmHg — ABNORMAL LOW (ref 83.0–108.0)
pO2, Arterial: 70 mmHg — ABNORMAL LOW (ref 83.0–108.0)
pO2, Arterial: 71 mmHg — ABNORMAL LOW (ref 83.0–108.0)
pO2, Arterial: 69 mmHg — ABNORMAL LOW (ref 83.0–108.0)
pO2, Arterial: 70 mmHg — ABNORMAL LOW (ref 83.0–108.0)
pO2, Arterial: 74 mmHg — ABNORMAL LOW (ref 83.0–108.0)
pO2, Arterial: 76 mmHg — ABNORMAL LOW (ref 83.0–108.0)
pO2, Arterial: 74 mmHg — ABNORMAL LOW (ref 83.0–108.0)
pO2, Arterial: 72 mmHg — ABNORMAL LOW (ref 83.0–108.0)
pO2, Arterial: 76 mmHg — ABNORMAL LOW (ref 83.0–108.0)

## 2019-10-06 LAB — TYPE AND SCREEN
ABO/RH(D): O POS
Antibody Screen: NEGATIVE
Unit division: 0
Unit division: 0
Unit division: 0
Unit division: 0

## 2019-10-06 LAB — CBC
HCT: 27.4 % — ABNORMAL LOW (ref 39.0–52.0)
HCT: 28.8 % — ABNORMAL LOW (ref 39.0–52.0)
Hemoglobin: 7.8 g/dL — ABNORMAL LOW (ref 13.0–17.0)
Hemoglobin: 8.1 g/dL — ABNORMAL LOW (ref 13.0–17.0)
MCH: 27.5 pg (ref 26.0–34.0)
MCH: 27.5 pg (ref 26.0–34.0)
MCHC: 28.5 g/dL — ABNORMAL LOW (ref 30.0–36.0)
MCHC: 28.1 g/dL — ABNORMAL LOW (ref 30.0–36.0)
MCV: 96.5 fL (ref 80.0–100.0)
MCV: 97.6 fL (ref 80.0–100.0)
Platelets: 103 10*3/uL — ABNORMAL LOW (ref 150–400)
Platelets: 125 10*3/uL — ABNORMAL LOW (ref 150–400)
RBC: 2.84 MIL/uL — ABNORMAL LOW (ref 4.22–5.81)
RBC: 2.95 MIL/uL — ABNORMAL LOW (ref 4.22–5.81)
RDW: 17.5 % — ABNORMAL HIGH (ref 11.5–15.5)
RDW: 18.3 % — ABNORMAL HIGH (ref 11.5–15.5)
WBC: 11.2 10*3/uL — ABNORMAL HIGH (ref 4.0–10.5)
WBC: 13.8 10*3/uL — ABNORMAL HIGH (ref 4.0–10.5)
nRBC: 0.2 % (ref 0.0–0.2)
nRBC: 0.1 % (ref 0.0–0.2)

## 2019-10-06 LAB — BPAM RBC
Blood Product Expiration Date: 202103222359
Blood Product Expiration Date: 202103232359
Blood Product Expiration Date: 202103282359
Blood Product Expiration Date: 202103282359
ISSUE DATE / TIME: 202102212226
ISSUE DATE / TIME: 202102250535
Unit Type and Rh: 5100
Unit Type and Rh: 5100
Unit Type and Rh: 5100
Unit Type and Rh: 5100

## 2019-10-06 LAB — RENAL FUNCTION PANEL
Albumin: 1.9 g/dL — ABNORMAL LOW (ref 3.5–5.0)
Anion gap: 7 (ref 5–15)
BUN: 70 mg/dL — ABNORMAL HIGH (ref 6–20)
CO2: 32 mmol/L (ref 22–32)
Calcium: 8.7 mg/dL — ABNORMAL LOW (ref 8.9–10.3)
Chloride: 105 mmol/L (ref 98–111)
Creatinine, Ser: 0.63 mg/dL (ref 0.61–1.24)
GFR calc Af Amer: >60 mL/min (ref >60)
GFR calc non Af Amer: >60 mL/min (ref >60)
Glucose, Bld: 74 mg/dL (ref 70–99)
Phosphorus: 3 mg/dL (ref 2.5–4.6)
Potassium: 4.5 mmol/L (ref 3.5–5.1)
Sodium: 144 mmol/L (ref 135–145)

## 2019-10-06 LAB — APTT
aPTT: 72 seconds — ABNORMAL HIGH (ref 24–36)
aPTT: 73 seconds — ABNORMAL HIGH (ref 24–36)

## 2019-10-06 LAB — HEPARIN LEVEL (UNFRACTIONATED)
Heparin Unfractionated: 0.32 IU/mL (ref 0.30–0.70)
Heparin Unfractionated: 0.37 IU/mL (ref 0.30–0.70)

## 2019-10-06 LAB — COOXEMETRY PANEL
Carboxyhemoglobin: 3.3 % — ABNORMAL HIGH (ref 0.5–1.5)
Methemoglobin: 1.2 % (ref 0.0–1.5)
O2 Saturation: 83.2 %
Total hemoglobin: 7.8 g/dL — ABNORMAL LOW (ref 12.0–16.0)

## 2019-10-06 LAB — FIBRINOGEN: Fibrinogen: 347 mg/dL (ref 210–475)

## 2019-10-06 LAB — LACTATE DEHYDROGENASE: LDH: 385 U/L — ABNORMAL HIGH (ref 98–192)

## 2019-10-06 LAB — MAGNESIUM: Magnesium: 2 mg/dL (ref 1.7–2.4)

## 2019-10-06 LAB — PHOSPHORUS: Phosphorus: 3.3 mg/dL (ref 2.5–4.6)

## 2019-10-06 LAB — LACTIC ACID, PLASMA: Lactic Acid, Venous: 1.2 mmol/L (ref 0.5–1.9)

## 2019-10-06 MED ORDER — INSULIN DETEMIR 100 UNIT/ML ~~LOC~~ SOLN
12.0000 [IU] | Freq: Two times a day (BID) | SUBCUTANEOUS | Status: DC
  Administered 2019-10-06 – 2019-10-16 (×19): 12 [IU] via SUBCUTANEOUS
  Filled 2019-10-06 (×24): qty 0.12

## 2019-10-06 MED ORDER — HYDRALAZINE HCL 50 MG PO TABS
100.0000 mg | ORAL_TABLET | Freq: Three times a day (TID) | ORAL | Status: DC
  Administered 2019-10-06 – 2019-10-15 (×24): 100 mg
  Filled 2019-10-06 (×24): qty 2

## 2019-10-06 MED ORDER — FUROSEMIDE 10 MG/ML IJ SOLN
40.0000 mg | Freq: Once | INTRAMUSCULAR | Status: AC
  Administered 2019-10-06: 09:00:00 40 mg via INTRAVENOUS
  Filled 2019-10-06: qty 4

## 2019-10-06 MED ORDER — GERHARDT'S BUTT CREAM: TOPICAL_CREAM | Freq: Every day | CUTANEOUS | Status: DC

## 2019-10-06 NOTE — Progress Notes (Signed)
Sweep trial began at 1100. ABG will be checked every hour or if showing signs of distress. Clear parameters have been discussed and [name] will be continued to be monitored.   Beginning ABG is as follows:  Sweep trial continues. Hand off to Night shift RN  Ref. Range 10/06/2019 10:50 10/06/2019 11:09  Sample type Unknown CARDIOPULMONARY BYPASS   pH, Arterial Latest Ref Range: 7.350 - 7.450  7.447   pCO2 arterial Latest Ref Range: 32.0 - 48.0 mmHg 52.3 (H)   pO2, Arterial Latest Ref Range: 83.0 - 108.0 mmHg 88.0   TCO2 Latest Ref Range: 22 - 32 mmol/L 38 (H)   Acid-Base Excess Latest Ref Range: 0.0 - 2.0 mmol/L 11.0 (H)   Bicarbonate Latest Ref Range: 20.0 - 28.0 mmol/L 36.0 (H)   O2 Saturation Latest Units: % 97.0   Patient temperature Unknown 37.1 C   Sodium Latest Ref Range: 135 - 145 mmol/L 146 (H)   Potassium Latest Ref Range: 3.5 - 5.1 mmol/L 4.2   Calcium Ionized Latest Ref Range: 1.15 - 1.40 mmol/L 1.25   Hemoglobin Latest Ref Range: 13.0 - 17.0 g/dL 9.9 (L)   HCT Latest Ref Range: 39.0 - 52.0 % 29.0 (L)

## 2019-10-06 NOTE — Progress Notes (Signed)
NAME:  [name], MRN:  [id number], DOB:  May 29, 1976, LOS: 13 ADMISSION DATE:  07/22/2019, CONSULTATION DATE:  12/24 REFERRING MD:  Sloan Leiter, CHIEF COMPLAINT:  Dyspnea   Brief History   44 y/o male admitted 12/14 with COVID pneumonia leading to ARDS, pneumomediastinum.  Intubated 12/26, bilateral chest tubes placed.  Past Medical History  GERD HTN Asthma  Significant Hospital Events   12/14 admit with hypoxemic respiratory failure in setting of COVID-19 pneumonia 12/23 noted to have pneumomediastinum on chest x-ray 12/24PCCM consulted for evaluation 12/26 worsening hypoxemia, intubated 12/27 VV fem/fem ECMO cannulation 12/29 tracheostomy >> See bedside nursing event notes for full rundown of procedures after 12/27 1/5 epistaxis, nasal packing 1/30 improved cxr, improved lung compliance, TV 350s on 15PC  1/31 CXR increased infiltrates, +CFB     2/2 Converted to right IJ Crescent cannula. 2/3 PEG tube inserted at bedside 2/14 30 min sweep trial> paO2 105 (18 PEEP, 60% FiO2) pH 7.22, pCO2 87 2/20 2 hours sweep trial 2/21 1.5 hour sweep trial 2/24 7-hour sweep trial.  Increasingly awake.  Consults:  PCCM, CTS, CHF, Palliative  Procedures:  PICC 1/3 >> R radial a-line 1/20 >> R IJ ECMO cath 2/3 >> Tracheostomy 12/29 >>  Significant Diagnostic Tests:  CT chest 12/22>>extensive pneumomediastinum, multifocal patchy bilateral groundglass opacities CXR 1/25 >> improving bilateral infiltrates. ECMO cannulae in place. CXR 2/5 >> clearing bilateral infiltrates  Micro Data:  BCx2 12/14 >>negative  Sputum 12/15 >> OPF 12/28 blood > negative 12/29 > staph aureus 1/4 bronch:  1/2 acid fast smear bronch: negative 1/2 fungal cx bronch: negative1/12 BAL: serratia, staph epidermidisu 1/4 BAL > MSSA, Serratia 1/9 blood > negative 1/9 Resp culture > MSSA, serratia 1/11 serratia > negative 1/12 fungus culture > negative 1/12 BAL > serratia 1/18 BAL >> rare  Serratia. 1/24 blood > negative 1/30 blood > negative 1/30 resp culture > serratia 2/1 BAL >> rare Serratia - enterococcus 2/1 aspergillus Ag > negative 2/2 resp culture > serratia  2/2 blood > negative 2/5 urine > negative 2/9 resp: serratia 2/15 BAL serratia  Antimicrobials:  Received remdesivir, steroids, actemra and convalescent plasma  5 days cefepime 12/24-12/29 Cefepime 1/4->1/13 vanc 1/4> 1/11 Ceftriaxone 1/14 > 1/27 vanc x1 1/20 Cefazolin 1/29 > 2/2 cefuroxim 2/2 vanc 2/2 Ceftriaxone x1 2/4 Meropenem 2/15>>2/21 Tobramycin inhaled 2/15>>2/21  Interim history/subjective:  More awake and interactive today. Bps still high. Denies pain. Trying to find something he likes on TV.  Objective   Blood pressure (!) 179/90, pulse 85, temperature 98.6 F (37 C), temperature source Core, resp. rate (!) 25, height _0  (1.727 m), weight 134.8 kg, SpO2 100 %. CVP:  [2 mmHg-18 mmHg] 13 mmHg  Vent Mode: PCV FiO2 (%):  [30 %-40 %] 30 % Set Rate:  [20 bmp] 20 bmp PEEP:  [10 cmH20] 10 cmH20 Plateau Pressure:  [28 cmH20-31 cmH20] 30 cmH20   Intake/Output Summary (Last 24 hours) at 10/06/2019 7628 Last data filed at 10/06/2019 0700 Gross per 24 hour  Intake 2425.12 ml  Output 3805 ml  Net -1379.88 ml   Filed Weights   10/04/19 0500 10/05/19 0500 10/06/19 0500  Weight: 135.3 kg 135.4 kg 134.8 kg    Examination: GEN: ill appearing man on vent HEENT: RIJ catheter CDI CV: RRR, ext warm PULM: Coarse bilaterally, shallow GI: Soft, PEG CDI EXT: Anasarca still present NEURO: follows commands more briskly today, profoundly weak PSYCH: RASS 0  SKIN: Pale  Gas/Vent/Sweep/Circuit look okay CBGs low CXR  about the same yesterday with bilateral airspace disease, question RUL apical subsegment down Net -1.7L yesterday  Resolved Hospital Problem list   Pneumothoraces-resolved.  Assessment & Plan:   ARDS due to COVID 19, requiring mechanical ventilation and VV ECMO  -  Improving, lingering issues with HCAP vs. Bronchitis and profound deconditioning. - Continue daily sweep trials - Poor lung compliance and diaphragmatic weakness limiting ventilation.   AKI with azotemia - improving  Positive cumulative fluid balance Hypernatremia has corrected Severe third spacing -Daily discussion regarding diuretics: antoher dose of lasix today  Hypertension - clonidine, amlodipine and propanolol and hydralazine; titrate up hydralazine again today  Serratia pneumonia> treated Stop date 2/22  Neuro muscular weakness Appreciate PT input.  For range of motion exercises. - Trial of testosterone per previous discussions  Bilateral lower feet deep tissue injury Wound care following  Hyperglycemia- now hypoglycemia - Drop levemir from 18 units BID to 12 units BID, q4h tube feed + SSI   Best practice:  Pain/Anxiety/Delirium protocol (if indicated): PRNs only VAP protocol (if indicated): Bundle in place. Respiratory support goals: Continue current ultra lung protective strategy.  DVT prophylaxis: Heparin drip, goal 0.3-0.5 GI prophylaxis: PPI Nutrition: TF Fluid status goals:  See above Urinary catheter: Guide hemodynamic management Glucose control: controlled with current regimen Mobility/therapy needs: Bedrest.  Daily labs: as per ECMO protocol. Code Status: Full code  Disposition: ICU  Labs   CBC: CBC Latest Ref Rng & Units 10/06/2019 10/06/2019 10/05/2019  WBC 4.0 - 10.5 K/uL - 11.2(H) -  Hemoglobin 13.0 - 17.0 g/dL 8.2(L) 7.8(L) 8.2(L)  Hematocrit 39.0 - 52.0 % 24.0(L) 27.4(L) 24.0(L)  Platelets 150 - 400 K/uL - 103(L) -     Basic Metabolic Panel: BMP Latest Ref Rng & Units 10/06/2019 10/06/2019 10/05/2019  Glucose 70 - 99 mg/dL - 74 -  BUN 6 - 20 mg/dL - 70(H) -  Creatinine 0.61 - 1.24 mg/dL - 0.63 -  Sodium 135 - 145 mmol/L 146(H) 144 146(H)  Potassium 3.5 - 5.1 mmol/L 4.5 4.5 4.6  Chloride 98 - 111 mmol/L - 105 -  CO2 22 - 32 mmol/L - 32 -   Calcium 8.9 - 10.3 mg/dL - 8.7(L) -     GFR: Estimated Creatinine Clearance: 156.7 mL/min (by C-G formula based on SCr of 0.63 mg/dL). Recent Labs  Lab 10/03/19 0315 10/03/19 1619 10/04/19 0340 10/04/19 1628 10/04/19 2007 10/05/19 0355 10/05/19 1600 10/06/19 0353  WBC 8.6   < > 8.9   < > 11.7* 10.8* 13.0* 11.2*  LATICACIDVEN 1.1  --  1.5  --   --  1.1  --  1.2   < > = values in this interval not displayed.    Liver Function Tests: Recent Labs  Lab 10/06/19 0353  ALBUMIN 1.9*   No results for input(s): LIPASE, AMYLASE in the last 168 hours. No results for input(s): AMMONIA in the last 168 hours.  ABG    Component Value Date/Time   PHART 7.449 10/06/2019 0407   PCO2ART 50.7 (H) 10/06/2019 0407   PO2ART 106.0 10/06/2019 0407   HCO3 35.2 (H) 10/06/2019 0407   TCO2 37 (H) 10/06/2019 0407   ACIDBASEDEF 4.0 (H) 09/16/2019 2334   O2SAT 83.2 10/06/2019 0420     Coagulation Profile: No results for input(s): INR, PROTIME in the last 168 hours.  Cardiac Enzymes: No results for input(s): CKTOTAL, CKMB, CKMBINDEX, TROPONINI in the last 168 hours.  HbA1C: Hgb A1c MFr Bld  Date/Time Value Ref Range Status  09/23/2019 08:58 AM 5.5 4.8 - 5.6 % Final    Comment:    (NOTE) Pre diabetes:          5.7%-6.4% Diabetes:              >6.4% Glycemic control for   <7.0% adults with diabetes   07/24/2019 04:50 AM 6.7 (H) 4.8 - 5.6 % Final    Comment:    (NOTE) Pre diabetes:          5.7%-6.4% Diabetes:              >6.4% Glycemic control for   <7.0% adults with diabetes     CBG: Recent Labs  Lab 10/05/19 1557 10/05/19 1937 10/05/19 2330 10/06/19 0406 10/06/19 0733  GLUCAP 91 101* 93 74 83   This patient is critically ill with multiple organ system failure; which, requires frequent high complexity decision making, assessment, support, evaluation, and titration of therapies. This was completed through the application of advanced monitoring technologies and extensive  interpretation of multiple databases. During this encounter critical care time was devoted to patient care services described in this note for 31 minutes.  Candee Furbish, MD [facility] 10/06/2019 8:37 AM

## 2019-10-06 NOTE — Progress Notes (Signed)
   Palliative Medicine Inpatient Follow Up Note  Chart reviewed. Met with [name]'s mother, [name] at bedside. She was concerned about a phone call she had received pertaining to [facility]. We identified that this is likely the next stage of planning on the end of case management.   [name] asked why we (Palliative care)  are still involved in seeing [name]. I shared with her that we continue to be a layer of support for she and her family while [name] is hospitalized. I informed her that although we do not see or speak to she or [name] daily that we remain to check on [name] through chart review, coordinating with both the nursing and medical teams to evaluate his progress. She verbalized understanding.  [name] shares that she feels [name] is doing better everyday and feels encouraged that she can can now communicate more with him.   I offered to call us moving forward if she has any concerns.   Otherwise, the Palliative team will continue to check in incrementally while [name] remains hospitalized.   Time Spent: 15 Greater than 50% of the time was spent in counseling and coordination of care ______________________________________________________________________________________ [place] Team Team Cell Phone: [phone] Please utilize secure chat with additional questions, if there is no response within 30 minutes please call the above phone number  Palliative Medicine Team providers are available by phone from 7am to 7pm daily and can be reached through the team cell phone.  Should this patient require assistance outside of these hours, please call the patient's attending physician.

## 2019-10-06 NOTE — Progress Notes (Signed)
Patient ID: [name], male   DOB: Mar 03, 1976, 44 y.o.   MRN: [id number] TCTS Evening Rounds:  Hemodynamically stable today on V-V ECMO Has tolerated 7 hrs of sweep trial today. PCO2 55 at noon increasing to 67.7 this evening. PO2 stable at 70, sats 92%.  Diuresing well -800 cc today.  BMET    Component Value Date/Time   NA 147 (H) 10/06/2019 1859   K 4.7 10/06/2019 1859   CL 105 10/06/2019 0353   CO2 32 10/06/2019 0353   GLUCOSE 74 10/06/2019 0353   BUN 70 (H) 10/06/2019 0353   CREATININE 0.63 10/06/2019 0353   CALCIUM 8.7 (L) 10/06/2019 0353   GFRNONAA >60 10/06/2019 0353   GFRAA >60 10/06/2019 0353   CBC    Component Value Date/Time   WBC 13.8 (H) 10/06/2019 1606   RBC 2.95 (L) 10/06/2019 1606   HGB 9.5 (L) 10/06/2019 1859   HCT 28.0 (L) 10/06/2019 1859   PLT 125 (L) 10/06/2019 1606   MCV 97.6 10/06/2019 1606   MCH 27.5 10/06/2019 1606   MCHC 28.1 (L) 10/06/2019 1606   RDW 18.3 (H) 10/06/2019 1606   LYMPHSABS 1.8 08/30/2019 0427   MONOABS 0.9 08/30/2019 0427   EOSABS 0.4 08/30/2019 0427   BASOSABS 0.1 08/30/2019 0427

## 2019-10-06 NOTE — Progress Notes (Signed)
Nursing Progress note  Brief History:  44 year old male that remains on V-V ECMO, cannulated on 08/03/19. Awake and alert on vent trach. Positive for covid pneumonia in Dec. 2020.    Intake/Output Summary (Last 24 hours) at 10/06/2019 1901 Last data filed at 10/06/2019 1800 Gross per 24 hour  Intake 2934.39 ml  Output 4020 ml  Net -1085.61 ml    . sodium chloride    . sodium chloride Stopped (09/30/19 0734)  . sodium chloride 10 mL/hr at 10/06/19 1800  . dexmedetomidine (PRECEDEX) IV infusion Stopped (10/06/19 0339)  . heparin 1,650 Units/hr (10/06/19 1800)  . niCARDipine 3 mg/hr (10/06/19 1800)    Today's Vitals   10/06/19 1800 10/06/19 1815 10/06/19 1830 10/06/19 1845  BP:      Pulse: 88 89 89 89  Resp: (!) 31 (!) 32 (!) 31 (!) 24  Temp:      TempSrc:      SpO2: 96% 96% 98% 98%  Weight:      Height:      PainSc:         Sodium  Date/Time Value Ref Range Status  10/06/2019 05:59 PM 147 (H) 135 - 145 mmol/L Final  10/06/2019 04:56 PM 147 (H) 135 - 145 mmol/L Final   Potassium  Date/Time Value Ref Range Status  10/06/2019 05:59 PM 4.6 3.5 - 5.1 mmol/L Final  10/06/2019 04:56 PM 4.6 3.5 - 5.1 mmol/L Final   Chloride  Date/Time Value Ref Range Status  10/06/2019 03:53 AM 105 98 - 111 mmol/L Final  10/05/2019 03:55 AM 109 98 - 111 mmol/L Final   CO2  Date/Time Value Ref Range Status  10/06/2019 03:53 AM 32 22 - 32 mmol/L Final  10/05/2019 03:55 AM 30 22 - 32 mmol/L Final   Glucose, Bld  Date/Time Value Ref Range Status  10/06/2019 03:53 AM 74 70 - 99 mg/dL Final    Comment:    Glucose reference range applies only to samples taken after fasting for at least 8 hours.  10/05/2019 03:55 AM 69 (L) 70 - 99 mg/dL Final    Comment:    Glucose reference range applies only to samples taken after fasting for at least 8 hours.   BUN  Date/Time Value Ref Range Status  10/06/2019 03:53 AM 70 (H) 6 - 20 mg/dL Final  10/05/2019 03:55 AM 76 (H) 6 - 20 mg/dL Final    Creatinine, Ser  Date/Time Value Ref Range Status  10/06/2019 03:53 AM 0.63 0.61 - 1.24 mg/dL Final  10/05/2019 03:55 AM 0.84 0.61 - 1.24 mg/dL Final     WBC  Date/Time Value Ref Range Status  10/06/2019 03:53 AM 11.2 (H) 4.0 - 10.5 K/uL Final  10/05/2019 04:00 PM 13.0 (H) 4.0 - 10.5 K/uL Final   Hemoglobin  Date/Time Value Ref Range Status  10/06/2019 04:56 PM 9.2 (L) 13.0 - 17.0 g/dL Final  10/06/2019 04:01 PM 8.8 (L) 13.0 - 17.0 g/dL Final   Platelets  Date/Time Value Ref Range Status  10/06/2019 03:53 AM 103 (L) 150 - 400 K/uL Final    Comment:    REPEATED TO VERIFY Immature Platelet Fraction may be clinically indicated, consider ordering this additional test [id number] CONSISTENT WITH PREVIOUS RESULT   10/05/2019 04:00 PM 112 (L) 150 - 400 K/uL Final    Comment:    REPEATED TO VERIFY SPECIMEN CHECKED FOR CLOTS Immature Platelet Fraction may be clinically indicated, consider ordering this additional test [id number] CONSISTENT WITH PREVIOUS RESULT  Nursing Plan:  Patient is much more awake and alert than yesterday.  Overnight was given precedex and PRN meds to help with establishing a better sleep pattern.  With precedex turned off patient is able to move shoulders well and shaking legs and neck.  Continued passive ROM and PT/OT assistance will help with this. Patient given hydralazine and lasix 2hrs prior to sweep trial just as they day prior. Sweep began at 11am and patient tolerated throughout the remainder of the shift. ABGs running every hour or PRN.  Cardene infusion going during the sweep trials as prescribed by Bensimhon MD. Tolerating tube feeds and supplemental nutrition, blood sugars well controled with SSI and basal insulin. Extremities still swollen but negative overall daily with weight decreasing.   Mother visited today, overall doing well. Helped with bath time today and was able to talk with patient while he was awake.

## 2019-10-06 NOTE — Progress Notes (Signed)
ANTICOAGULATION CONSULT NOTE - Follow Up Consult  Pharmacy Consult for Heparin Indication: ECMO  Labs: Recent Labs    10/04/19 0340 10/04/19 0517 10/05/19 0355 10/05/19 0356 10/05/19 1600 10/05/19 1641 10/06/19 0353 10/06/19 0407 10/06/19 1601 10/06/19 1601 10/06/19 1606 10/06/19 1656  HGB 8.2*   < > 8.1*   < > 8.1*   < > 7.8*   < > 8.8*   < > 8.1* 9.2*  HCT 28.2*   < > 27.6*   < > 28.5*   < > 27.4*   < > 26.0*  --  28.8* 27.0*  PLT 95*   < > 107*   < > 112*  --  103*  --   --   --  125*  --   APTT 72*   < > 86*   < > 78*  --  72*  --   --   --  73*  --   HEPARINUNFRC 0.34   < >  --    < > 0.31  --  0.32  --   --   --  0.37  --   CREATININE 0.68  --  0.84  --   --   --  0.63  --   --   --   --   --    < > = values in this interval not displayed.    Assessment: 44yo male on ECMO.  Heparin level this evening trending upwards to 0.37, on 1650 units/hr. No bleeding or infusion issues per RN. Hgb stable at 9.2, plt up from earlier to 125.   Transfuse when Hgb < 7. LDH and fibrinogen stable.   Goal of Therapy:  Heparin level 0.3-0.4 units/ml   Plan:  Continue IV heparin at 1650 units/hr. Heparin levels and CBC with routine ECMO labs. Monitor for bleeding  Antonietta Jewel, PharmD, BCCCP Clinical Pharmacist  Phone: [phone]  Please check AMION for all [place] phone numbers After 10:00 PM, call [place] [phone]

## 2019-10-06 NOTE — Plan of Care (Signed)
  Problem: Education: Goal: Knowledge of risk factors and measures for prevention of condition will improve Outcome: Progressing   Problem: Coping: Goal: Psychosocial and spiritual needs will be supported Outcome: Progressing   Problem: Respiratory: Goal: Complications related to the disease process, condition or treatment will be avoided or minimized Outcome: Progressing   Problem: Respiratory: Goal: Ability to maintain a clear airway and adequate ventilation will improve Outcome: Progressing   Problem: Role Relationship: Goal: Method of communication will improve Outcome: Progressing   Problem: Education: Goal: Knowledge of General Education information will improve Description: Including pain rating scale, medication(s)/side effects and non-pharmacologic comfort measures Outcome: Progressing   Problem: Health Behavior/Discharge Planning: Goal: Ability to manage health-related needs will improve Outcome: Progressing   Problem: Clinical Measurements: Goal: Ability to maintain clinical measurements within normal limits will improve Outcome: Progressing Goal: Will remain free from infection Outcome: Progressing Goal: Diagnostic test results will improve Outcome: Progressing Goal: Respiratory complications will improve Outcome: Progressing Goal: Cardiovascular complication will be avoided Outcome: Progressing   Problem: Activity: Goal: Risk for activity intolerance will decrease Outcome: Progressing   Problem: Nutrition: Goal: Adequate nutrition will be maintained Outcome: Progressing   Problem: Coping: Goal: Level of anxiety will decrease Outcome: Progressing   Problem: Elimination: Goal: Will not experience complications related to bowel motility Outcome: Progressing Goal: Will not experience complications related to urinary retention Outcome: Progressing   Problem: Pain Managment: Goal: General experience of comfort will improve Outcome: Progressing    Problem: Safety: Goal: Ability to remain free from injury will improve Outcome: Progressing   Problem: Skin Integrity: Goal: Risk for impaired skin integrity will decrease Outcome: Progressing

## 2019-10-06 NOTE — Progress Notes (Addendum)
2/28 1905 Sweep Trial continued. At this time, [name] appears comfortable and doing well. ABG will be checked every hour or if showing signs of distress.   3/1 0700 Sweep Trial continuing. Handoff given to next shift ECMO Specialist, [name].

## 2019-10-06 NOTE — Progress Notes (Signed)
Patient ID: [name], male   DOB: 1975-10-21, 44 y.o.   MRN: [id number]    Advanced Heart Failure Rounding Note   Subjective:    Remains on VV ecmo.  BAL growing > 100K serratia. On meropenum day 14/14  Sweep at 2 On vent at 30% TVs > 450-500cc no change   CXR improving today.Personally reviewed  More awake and interactive today. Follows commands. Struggling to move arms and legs.   Hgb 8.2 LDH 378 -> 383 -> 385 Heparin level: 0.32 Lactic acid 1.2 BUN stable at 70   ECMO parameters - see separate ecmo note   Objective:   Weight Range:  Vital Signs:   Temp:  [98.2 F (36.8 C)-98.6 F (37 C)] 98.6 F (37 C) (02/28 0300) Pulse Rate:  [70-102] 98 (02/28 1000) Resp:  [17-36] 33 (02/28 1000) BP: (140-179)/(52-99) 159/99 (02/28 0906) SpO2:  [94 %-100 %] 100 % (02/28 1000) Arterial Line BP: (108-192)/(50-87) 151/66 (02/28 1000) FiO2 (%):  [30 %-40 %] 30 % (02/28 0809) Weight:  [134.8 kg] 134.8 kg (02/28 0500) Last BM Date: 10/05/19  Weight change: Filed Weights   10/04/19 0500 10/05/19 0500 10/06/19 0500  Weight: 135.3 kg 135.4 kg 134.8 kg    Intake/Output:   Intake/Output Summary (Last 24 hours) at 10/06/2019 1035 Last data filed at 10/06/2019 1000 Gross per 24 hour  Intake 2888.71 ml  Output 3705 ml  Net -816.29 ml     Physical Exam: General:  Awake on vent/ECMO. Following commands but unable to move arms much. moving legs better HEENT: normal Neck: supple. + RIJ cannula + trach Carotids 2+ bilat; no bruits. No lymphadenopathy or thryomegaly appreciated. Cor: PMI nondisplaced. Regular rate & rhythm. No rubs, gallops or murmurs. Lungs: clear Abdomen: obese soft, nontender, nondistended. No hepatosplenomegaly. No bruits or masses. Good bowel sounds. Extremities: no cyanosis, clubbing, rash, 1+ edema. DTIs on feet Neuro: Awake on vent/ECMO. Following commands but unable to move arms much. moving legs better   Telemetry: Sinus 90-100s  Personally reviewed   Labs: Basic Metabolic Panel: Recent Labs  Lab 10/02/19 0452 10/02/19 0742 10/03/19 0315 10/03/19 0417 10/03/19 1619 10/03/19 1700 10/04/19 0340 10/04/19 0517 10/05/19 0355 10/05/19 0419 10/05/19 1238 10/05/19 1641 10/05/19 2333 10/06/19 0353 10/06/19 0407  NA 146*   < > 145   < > 143   < > 143   < > 143   < > 144 146* 146* 144 146*  K 4.6   < > 4.6   < > 4.7   < > 4.9   < > 4.6   < > 4.6 4.7 4.6 4.5 4.5  CL 108   < > 108  --  106  --  106  --  109  --   --   --   --  105  --   CO2 29   < > 30  --  30  --  29  --  30  --   --   --   --  32  --   GLUCOSE 106*   < > 95  --  60*  --  123*  --  69*  --   --   --   --  74  --   BUN 86*   < > 86*  --  92*  --  82*  --  76*  --   --   --   --  70*  --   CREATININE 0.69   < >  0.69  --  0.72  --  0.68  --  0.84  --   --   --   --  0.63  --   CALCIUM 8.8*   < > 8.7*   < > 8.7*   < > 8.5*  --  8.5*  --   --   --   --  8.7*  --   MG 2.2  --  2.2  --   --   --  2.2  --  2.1  --   --   --   --  2.0  --   PHOS 3.6  --  3.8  --   --   --  3.6  --  3.5  --   --   --   --  3.0  3.3  --    < > = values in this interval not displayed.    Liver Function Tests: Recent Labs  Lab 10/06/19 0353  ALBUMIN 1.9*   No results for input(s): LIPASE, AMYLASE in the last 168 hours. No results for input(s): AMMONIA in the last 168 hours.  CBC: Recent Labs  Lab 10/04/19 1628 10/04/19 1635 10/04/19 2007 10/04/19 2014 10/05/19 0355 10/05/19 0419 10/05/19 1600 10/05/19 1641 10/05/19 2333 10/06/19 0353 10/06/19 0407  WBC 11.9*  --  11.7*  --  10.8*  --  13.0*  --   --  11.2*  --   HGB 7.9*   < > 8.2*   < > 8.1*   < > 8.1* 8.8* 8.2* 7.8* 8.2*  HCT 27.5*   < > 28.8*   < > 27.6*   < > 28.5* 26.0* 24.0* 27.4* 24.0*  MCV 94.8  --  94.7  --  95.2  --  96.6  --   --  96.5  --   PLT 101*  --  109*  --  107*  --  112*  --   --  103*  --    < > = values in this interval not displayed.    Cardiac Enzymes: No results for  input(s): CKTOTAL, CKMB, CKMBINDEX, TROPONINI in the last 168 hours.  BNP: BNP (last 3 results) Recent Labs    07/22/19 1039  BNP 15.3    ProBNP (last 3 results) No results for input(s): PROBNP in the last 8760 hours.    Other results:  Imaging: DG CHEST PORT 1 VIEW  Result Date: 10/06/2019 CLINICAL DATA:  ECMO EXAM: PORTABLE CHEST 1 VIEW COMPARISON:  10/05/2019 FINDINGS: Tracheostomy tube again noted. ECMO cannula is similar. Cardiopericardial silhouette is enlarged. Diffuse patchy bilateral airspace disease shows no substantial interval change. No pleural effusion. The visualized bony structures of the thorax are intact. Telemetry leads overlie the chest. IMPRESSION: Stable exam. Electronically Signed   By: Misty Stanley M.D.   On: 10/06/2019 08:56   DG CHEST PORT 1 VIEW  Result Date: 10/05/2019 CLINICAL DATA:  ECMO. EXAM: PORTABLE CHEST 1 VIEW COMPARISON:  10/04/2019 FINDINGS: 0513 hours. Tracheostomy tube again noted. ECMO cannula remains in place. Cardiopericardial silhouette is at upper limits of normal for size. Diffuse bilateral airspace disease is not substantially changed in the interval. The visualized bony structures of the thorax are intact. Telemetry leads overlie the chest. IMPRESSION: Stable exam.  No new or progressive interval findings. Electronically Signed   By: Misty Stanley M.D.   On: 10/05/2019 06:49     Medications:     Scheduled Medications: . sodium chloride   Intravenous Once  .  acetaminophen (TYLENOL) oral liquid 160 mg/5 mL  650 mg Per Tube Q6H  . amiodarone  200 mg Per Tube Daily  . amLODipine  10 mg Per Tube Daily  . vitamin C  500 mg Per Tube Daily  . chlorhexidine gluconate (MEDLINE KIT)  15 mL Mouth Rinse BID  . Chlorhexidine Gluconate Cloth  6 each Topical Daily  . cloNIDine  0.2 mg Per Tube Q6H  . feeding supplement (OSMOLITE 1.5 CAL)  237 mL Per Tube 6 X Daily  . feeding supplement (PRO-STAT SUGAR FREE 64)  60 mL Per Tube QID  . free  water  300 mL Per Tube Q6H  . Gerhardt's butt cream   Topical QID  . hydrALAZINE  100 mg Per Tube Q8H  . insulin aspart  0-20 Units Subcutaneous Q4H  . insulin aspart  5 Units Subcutaneous Q4H  . insulin detemir  12 Units Subcutaneous BID  . ipratropium-albuterol  3 mL Nebulization Q4H  . magic mouthwash  5 mL Oral TID  . mouth rinse  15 mL Mouth Rinse 10 times per day  . montelukast  10 mg Per Tube QHS  . nutrition supplement (JUVEN)  1 packet Per Tube BID BM  . pantoprazole sodium  40 mg Per Tube BID  . propranolol  80 mg Per Tube TID  . psyllium  1 packet Per Tube BID  . QUEtiapine  50 mg Per Tube QHS  . sodium chloride flush  10-40 mL Intracatheter Q12H  . testosterone  5 g Transdermal Daily  . white petrolatum   Topical BID    Infusions: . sodium chloride    . sodium chloride Stopped (09/30/19 0734)  . sodium chloride 10 mL/hr at 10/06/19 1000  . dexmedetomidine (PRECEDEX) IV infusion Stopped (10/06/19 0339)  . heparin 1,650 Units/hr (10/06/19 1000)  . meropenem (MERREM) IV Stopped (10/06/19 0841)  . niCARDipine Stopped (10/05/19 1147)    PRN Medications: Place/Maintain arterial line **AND** sodium chloride, sodium chloride, sodium chloride, acetaminophen (TYLENOL) oral liquid 160 mg/5 mL, acetaminophen, [COMPLETED] diazepam **FOLLOWED BY** diazepam, hydrALAZINE, HYDROmorphone, magnesium hydroxide, midazolam, ondansetron (ZOFRAN) IV, senna-docusate, sodium chloride, sodium chloride flush   Assessment/Plan:    1. Acute hypoxic/hypercarbic respiratory failure due to COVID PNA: Remained markedly hypoxic despite full support. Intubated 12/26. S/p bilateral CTs 12/26 for pneumothoraces.  TEE on 12/26 LVEF 70% RV ok. VV ECMO begun 12/26. Tracheostomy 12/29.  COVID ARDS at this point. ECMO circuit changed 1/8 & 1/23. Crescent cannula placed 2/2 (femoral cannuals removed) - ECMO parameters as per ECMO note - Flows look good on Crescent - Developed PNA and sepsis on 2/15.  BAL  with > 100K serratia. Finished nebulized tobra. On mero. Today day 14/14 of meropenum - Neuro has seen and I discussed with them. 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 Continue passive ROM exercises with PT. Supportive care - Continue daily sweep trials and autowean of sweep. Sweep at 2. Will be limited due to muscle weakness - Has done poorly on sweep trials for past several days. Looks much better today. CXR improved  - Will give lasix today and also hydralazine and then repeat sweep trial. Can use nicardipine for HTN - We will see how he does with sweep. May be better strategy to just continue to wean sweep slowly rather than have more aggressive trials  - LDH ok - Continue heparin. Level 0.32 Discussed dosing with PharmD personally. - Continue chest PT  2. Bilateral PTX: Due to barotrauma.  - Resolved. CTs out  3. COVID PNA: management of COVID infection per CCM. CXR with bilateral multifocal PNA progressing to likely ARDS.  - s/p 10 days of remdesivir - 12/16, 12/2 s/p tociluzimab - 12/17 s/p convalescent plasma - Decadron at 31m/daily completed 1/11 - Completed cefepime/vanc - Off Ancef  4. ID: Empiric coverage with vancomycin/cefepime initially for possible secondary bacterial PNA, course completed.   - treated with cefepime and vanc for + cx with staph epidimidis, MSSA and serratia (sensitivities ok). - developed recurrent PNA and sepsis 2/15 - BAL with > 100K serratia. On mero.   - Developed PNA and sepsis on 2/15.  BAL with > 100K serratia. Finished nebulized tobra. On meropenum. Today day 14/14 of meropenum. Watch closely for worsening infection once abx off as he had been colonized with serratia. No changes  5. Anemia: - hgb 8.2 this am.Will transfuse as needed to keep Hgb> 8.0 - No change  7. FEN - now with G-tube.Continue TFs  8. Hypernatremia - Na 146 - Continue FW at current dose  9. AKI - Volume status improved.  - BUN stable at 70   10. DTIs - WOC  following. Has evolving wound on left 5th toe. Continue to follow  Diurese again today. Repeat sweep trial. PT to do PROM. Case discussed in detail in multidisciplinary ECMO rounds.  Length of Stay: 735 CRITICAL CARE Performed by: BGlori Bickers Total critical care time: 35 minutes  Critical care time was exclusive of separately billable procedures and treating other patients.  Critical care was necessary to treat or prevent imminent or life-threatening deterioration.  Critical care was time spent personally by me (independent of midlevel providers or residents) on the following activities: development of treatment plan with patient and/or surrogate as well as nursing, discussions with consultants, evaluation of patient's response to treatment, examination of patient, obtaining history from patient or surrogate, ordering and performing treatments and interventions, ordering and review of laboratory studies, ordering and review of radiographic studies, pulse oximetry and re-evaluation of patient's condition.    DGlori BickersMD 10/06/2019, 10:35 AM  Advanced Heart Failure Team Pager [phone](M-F; 7[place]  Please contact [facility] for night-coverage after hours (4p -7a ) and weekends on [email or web address]

## 2019-10-06 NOTE — Progress Notes (Signed)
25 Days Post-Op Procedure(s) (LRB): PEG TUBE INSERTION - BEDSIDE (N/A) ESOPHAGOGASTRODUODENOSCOPY (EGD) (N/A) Subjective:  Trached on vent. More alert and following commands today. Moving shoulders, head and legs but jerky movements.  -1700 cc/24 hrs. Wt trending down. CVP 8  Objective: Vital signs in last 24 hours: Temp:  [98.2 F (36.8 C)-98.6 F (37 C)] 98.6 F (37 C) (02/28 1111) Pulse Rate:  [70-102] 93 (02/28 1115) Cardiac Rhythm: Normal sinus rhythm (02/28 0800) Resp:  [17-36] 34 (02/28 1137) BP: (154-179)/(55-99) 154/64 (02/28 1137) SpO2:  [98 %-100 %] 100 % (02/28 1115) Arterial Line BP: (108-188)/(52-87) 152/62 (02/28 1115) FiO2 (%):  [30 %-40 %] 40 % (02/28 1138) Weight:  [134.8 kg] 134.8 kg (02/28 0500)  Hemodynamic parameters for last 24 hours: CVP:  [4 mmHg-18 mmHg] 8 mmHg  Intake/Output from previous day: 02/27 0701 - 02/28 0700 In: 2445.2 [I.V.:588.1; NG/GT:1437; IV Piggyback:300.1] Out: 4098 [Urine:4155] Intake/Output this shift: Total I/O In: 545.8 [I.V.:95.9; Other:250; IV Piggyback:199.9] Out: 1250 [Urine:1250]  General appearance: alert and cooperative Heart: regular rate and rhythm, S1, S2 normal Lungs: clear to auscultation bilaterally Abdomen: soft, non-tender; bowel sounds normal, PEG site ok Extremities: moderate anasarca Cannula site stable with no bleeding.  Lab Results: Recent Labs    10/05/19 1600 10/05/19 1641 10/06/19 0353 10/06/19 0353 10/06/19 0407 10/06/19 1050  WBC 13.0*  --  11.2*  --   --   --   HGB 8.1*   < > 7.8*   < > 8.2* 9.9*  HCT 28.5*   < > 27.4*   < > 24.0* 29.0*  PLT 112*  --  103*  --   --   --    < > = values in this interval not displayed.   BMET:  Recent Labs    10/05/19 0355 10/05/19 0419 10/06/19 0353 10/06/19 0353 10/06/19 0407 10/06/19 1050  NA 143   < > 144   < > 146* 146*  K 4.6   < > 4.5   < > 4.5 4.2  CL 109  --  105  --   --   --   CO2 30  --  32  --   --   --   GLUCOSE 69*  --  74  --    --   --   BUN 76*  --  70*  --   --   --   CREATININE 0.84  --  0.63  --   --   --   CALCIUM 8.5*  --  8.7*  --   --   --    < > = values in this interval not displayed.    PT/INR: No results for input(s): LABPROT, INR in the last 72 hours. ABG    Component Value Date/Time   PHART 7.447 10/06/2019 1050   HCO3 36.0 (H) 10/06/2019 1050   TCO2 38 (H) 10/06/2019 1050   ACIDBASEDEF 4.0 (H) 09/16/2019 2334   O2SAT 97.0 10/06/2019 1050   CBG (last 3)  Recent Labs    10/06/19 0406 10/06/19 0733 10/06/19 1109  GLUCAP 74 83 103*   CLINICAL DATA:  ECMO  EXAM: PORTABLE CHEST 1 VIEW  COMPARISON:  10/05/2019  FINDINGS: Tracheostomy tube again noted. ECMO cannula is similar. Cardiopericardial silhouette is enlarged. Diffuse patchy bilateral airspace disease shows no substantial interval change. No pleural effusion. The visualized bony structures of the thorax are intact. Telemetry leads overlie the chest.  IMPRESSION: Stable exam.   Electronically Signed  By: Misty Stanley M.D.   On: 10/06/2019 08:56  Assessment/Plan:  Stable on V-V ECMO ABG ok on Sweep of 2, 30% FiO2 Continue diuresis. Plans for further sweep trials per CCM and Heart Failure team.   LOS: 76 days    Gaye Pollack 10/06/2019

## 2019-10-06 NOTE — CV Procedure (Signed)
   ECMO NOTE:  Indication: Respiratory failure due to COVID PNA  Initial cannulation date: 08/03/2019  ECMO type: VV ECMO (Cardio-Help)  Dual lumen  Inflow/return cannula:  1) 32 FR Crescent placed 2/2  ECMO events:  Initial cannulation 12/26 Circuit changed and trach exchanged on 1/8.   Underwent addition of a RIJ drainage cannula on 1/20 Circuit changed on 1/22 for rising LDH Had evidence of recirculation and RIJ cannula pulled back on 1/26 19 FR RIJ removed 09/06/19 Underwent change to [facility] catheter with removal of femoral lines 2/2 2/21 2 hour sweep trial  2/22 Sedation weaned off  2/24 7 hour sweep trial  2/25 3 hours of sweep trial 2/26  2.5 hours of sweep trial  2/27 1 hour sweep trial   Daily data:  Remains on VV ecmo with good flows. Awake on vent and following commands. Limited by severe myopathy    Flow 4.65 RPM 3550 dP 40 Pven  -69 Sweep  2 SVO2 79.9%  Labs:  Hgb 8.2 LDH 378 -> 383 -> 385 Heparin level: 0.32 Lactic acid 1.2 BUN 70  Plan:  Crescent cannula working well Excellent flow and sats. Developed recurrent PNA and sepsis 2/15.  Cx with serratia. Continue meropenum today is 3/14 Sedation turned off 2/22. Awake following commands intermittently but very weak. Seen by neuro and has critical care myopathy  Continue to wean support. Sweep at 2 today. Wean as tolerated Vent on minimal settings.  Much more alert today Continue sweep trials.   Diurese again today. Repeat sweep trial. PT to do PROM.  Glori Bickers, MD  8:34 AM

## 2019-10-06 NOTE — Progress Notes (Signed)
ANTICOAGULATION CONSULT NOTE - Follow Up Consult  Pharmacy Consult for Heparin Indication: ECMO  Labs: Recent Labs    10/04/19 0340 10/04/19 0517 10/04/19 1628 10/05/19 0355 10/05/19 0356 10/05/19 0419 10/05/19 1600 10/05/19 1641 10/05/19 2333 10/05/19 2333 10/06/19 0353 10/06/19 0407  HGB 8.2*   < >  --  8.1*  --    < > 8.1*   < > 8.2*   < > 7.8* 8.2*  HCT 28.2*   < >  --  27.6*  --    < > 28.5*   < > 24.0*  --  27.4* 24.0*  PLT 95*   < >  --  107*  --   --  112*  --   --   --  103*  --   APTT 72*   < >  --  86*  --   --  78*  --   --   --  72*  --   HEPARINUNFRC 0.34   < >   < >  --  0.32  --  0.31  --   --   --  0.32  --   CREATININE 0.68  --   --  0.84  --   --   --   --   --   --  0.63  --    < > = values in this interval not displayed.    Assessment: 44yo male on ECMO.  Heparin level this evening came back therapeutic at 0.31, on 1650 units/hr. Hgb stable at 8.8, plt stable at 112 (trending up from earlier slightly). No s/sx of bleeding or infusion issues per RN - dressing site changed with minimal new bleeding.   Transfuse when Hgb < 7. LDH and fibrinogen stable.   2/28 AM update:  Heparin level remains within therapeutic range Hgb stable from yesterday   Goal of Therapy:  Heparin level 0.3-0.4 units/ml   Plan:  Continue IV heparin at 1650 units/hr. Heparin levels and CBC with routine ECMO labs. Monitor for bleeding  Narda Bonds, PharmD, [place] Clinical Pharmacist Phone: [phone]

## 2019-10-07 ENCOUNTER — Inpatient Hospital Stay (HOSPITAL_COMMUNITY): Payer: Managed Care, Other (non HMO)

## 2019-10-07 DIAGNOSIS — I428 Other cardiomyopathies: Secondary | ICD-10-CM

## 2019-10-07 LAB — POCT I-STAT 7, (LYTES, BLD GAS, ICA,H+H)
Acid-Base Excess: 10 mmol/L — ABNORMAL HIGH (ref 0.0–2.0)
Acid-Base Excess: 10 mmol/L — ABNORMAL HIGH (ref 0.0–2.0)
Acid-Base Excess: 10 mmol/L — ABNORMAL HIGH (ref 0.0–2.0)
Acid-Base Excess: 10 mmol/L — ABNORMAL HIGH (ref 0.0–2.0)
Acid-Base Excess: 11 mmol/L — ABNORMAL HIGH (ref 0.0–2.0)
Acid-Base Excess: 11 mmol/L — ABNORMAL HIGH (ref 0.0–2.0)
Acid-Base Excess: 12 mmol/L — ABNORMAL HIGH (ref 0.0–2.0)
Acid-Base Excess: 8 mmol/L — ABNORMAL HIGH (ref 0.0–2.0)
Acid-Base Excess: 9 mmol/L — ABNORMAL HIGH (ref 0.0–2.0)
Acid-Base Excess: 9 mmol/L — ABNORMAL HIGH (ref 0.0–2.0)
Acid-Base Excess: 9 mmol/L — ABNORMAL HIGH (ref 0.0–2.0)
Acid-Base Excess: 9 mmol/L — ABNORMAL HIGH (ref 0.0–2.0)
Bicarbonate: 34.8 mmol/L — ABNORMAL HIGH (ref 20.0–28.0)
Bicarbonate: 35.1 mmol/L — ABNORMAL HIGH (ref 20.0–28.0)
Bicarbonate: 35.3 mmol/L — ABNORMAL HIGH (ref 20.0–28.0)
Bicarbonate: 35.5 mmol/L — ABNORMAL HIGH (ref 20.0–28.0)
Bicarbonate: 35.6 mmol/L — ABNORMAL HIGH (ref 20.0–28.0)
Bicarbonate: 35.8 mmol/L — ABNORMAL HIGH (ref 20.0–28.0)
Bicarbonate: 36.3 mmol/L — ABNORMAL HIGH (ref 20.0–28.0)
Bicarbonate: 36.4 mmol/L — ABNORMAL HIGH (ref 20.0–28.0)
Bicarbonate: 36.6 mmol/L — ABNORMAL HIGH (ref 20.0–28.0)
Bicarbonate: 36.9 mmol/L — ABNORMAL HIGH (ref 20.0–28.0)
Bicarbonate: 37.2 mmol/L — ABNORMAL HIGH (ref 20.0–28.0)
Bicarbonate: 37.7 mmol/L — ABNORMAL HIGH (ref 20.0–28.0)
Calcium, Ion: 1.27 mmol/L (ref 1.15–1.40)
Calcium, Ion: 1.28 mmol/L (ref 1.15–1.40)
Calcium, Ion: 1.28 mmol/L (ref 1.15–1.40)
Calcium, Ion: 1.28 mmol/L (ref 1.15–1.40)
Calcium, Ion: 1.29 mmol/L (ref 1.15–1.40)
Calcium, Ion: 1.29 mmol/L (ref 1.15–1.40)
Calcium, Ion: 1.3 mmol/L (ref 1.15–1.40)
Calcium, Ion: 1.31 mmol/L (ref 1.15–1.40)
Calcium, Ion: 1.31 mmol/L (ref 1.15–1.40)
Calcium, Ion: 1.32 mmol/L (ref 1.15–1.40)
Calcium, Ion: 1.32 mmol/L (ref 1.15–1.40)
Calcium, Ion: 1.32 mmol/L (ref 1.15–1.40)
HCT: 23 % — ABNORMAL LOW (ref 39.0–52.0)
HCT: 23 % — ABNORMAL LOW (ref 39.0–52.0)
HCT: 23 % — ABNORMAL LOW (ref 39.0–52.0)
HCT: 23 % — ABNORMAL LOW (ref 39.0–52.0)
HCT: 24 % — ABNORMAL LOW (ref 39.0–52.0)
HCT: 24 % — ABNORMAL LOW (ref 39.0–52.0)
HCT: 24 % — ABNORMAL LOW (ref 39.0–52.0)
HCT: 24 % — ABNORMAL LOW (ref 39.0–52.0)
HCT: 24 % — ABNORMAL LOW (ref 39.0–52.0)
HCT: 25 % — ABNORMAL LOW (ref 39.0–52.0)
HCT: 26 % — ABNORMAL LOW (ref 39.0–52.0)
HCT: 26 % — ABNORMAL LOW (ref 39.0–52.0)
Hemoglobin: 7.8 g/dL — ABNORMAL LOW (ref 13.0–17.0)
Hemoglobin: 7.8 g/dL — ABNORMAL LOW (ref 13.0–17.0)
Hemoglobin: 7.8 g/dL — ABNORMAL LOW (ref 13.0–17.0)
Hemoglobin: 7.8 g/dL — ABNORMAL LOW (ref 13.0–17.0)
Hemoglobin: 8.2 g/dL — ABNORMAL LOW (ref 13.0–17.0)
Hemoglobin: 8.2 g/dL — ABNORMAL LOW (ref 13.0–17.0)
Hemoglobin: 8.2 g/dL — ABNORMAL LOW (ref 13.0–17.0)
Hemoglobin: 8.2 g/dL — ABNORMAL LOW (ref 13.0–17.0)
Hemoglobin: 8.2 g/dL — ABNORMAL LOW (ref 13.0–17.0)
Hemoglobin: 8.5 g/dL — ABNORMAL LOW (ref 13.0–17.0)
Hemoglobin: 8.8 g/dL — ABNORMAL LOW (ref 13.0–17.0)
Hemoglobin: 8.8 g/dL — ABNORMAL LOW (ref 13.0–17.0)
O2 Saturation: 91 %
O2 Saturation: 92 %
O2 Saturation: 92 %
O2 Saturation: 94 %
O2 Saturation: 94 %
O2 Saturation: 94 %
O2 Saturation: 95 %
O2 Saturation: 95 %
O2 Saturation: 96 %
O2 Saturation: 96 %
O2 Saturation: 96 %
O2 Saturation: 96 %
Patient temperature: 36.8
Patient temperature: 36.9
Patient temperature: 37
Patient temperature: 37
Patient temperature: 37
Patient temperature: 37
Patient temperature: 37
Patient temperature: 37
Patient temperature: 37
Patient temperature: 37
Patient temperature: 98.8
Patient temperature: 99.2
Potassium: 4.5 mmol/L (ref 3.5–5.1)
Potassium: 4.5 mmol/L (ref 3.5–5.1)
Potassium: 4.6 mmol/L (ref 3.5–5.1)
Potassium: 4.6 mmol/L (ref 3.5–5.1)
Potassium: 4.6 mmol/L (ref 3.5–5.1)
Potassium: 4.6 mmol/L (ref 3.5–5.1)
Potassium: 4.6 mmol/L (ref 3.5–5.1)
Potassium: 4.6 mmol/L (ref 3.5–5.1)
Potassium: 4.7 mmol/L (ref 3.5–5.1)
Potassium: 4.7 mmol/L (ref 3.5–5.1)
Potassium: 4.7 mmol/L (ref 3.5–5.1)
Potassium: 4.9 mmol/L (ref 3.5–5.1)
Sodium: 145 mmol/L (ref 135–145)
Sodium: 146 mmol/L — ABNORMAL HIGH (ref 135–145)
Sodium: 146 mmol/L — ABNORMAL HIGH (ref 135–145)
Sodium: 146 mmol/L — ABNORMAL HIGH (ref 135–145)
Sodium: 146 mmol/L — ABNORMAL HIGH (ref 135–145)
Sodium: 146 mmol/L — ABNORMAL HIGH (ref 135–145)
Sodium: 147 mmol/L — ABNORMAL HIGH (ref 135–145)
Sodium: 147 mmol/L — ABNORMAL HIGH (ref 135–145)
Sodium: 147 mmol/L — ABNORMAL HIGH (ref 135–145)
Sodium: 147 mmol/L — ABNORMAL HIGH (ref 135–145)
Sodium: 148 mmol/L — ABNORMAL HIGH (ref 135–145)
Sodium: 148 mmol/L — ABNORMAL HIGH (ref 135–145)
TCO2: 36 mmol/L — ABNORMAL HIGH (ref 22–32)
TCO2: 37 mmol/L — ABNORMAL HIGH (ref 22–32)
TCO2: 37 mmol/L — ABNORMAL HIGH (ref 22–32)
TCO2: 37 mmol/L — ABNORMAL HIGH (ref 22–32)
TCO2: 37 mmol/L — ABNORMAL HIGH (ref 22–32)
TCO2: 38 mmol/L — ABNORMAL HIGH (ref 22–32)
TCO2: 38 mmol/L — ABNORMAL HIGH (ref 22–32)
TCO2: 38 mmol/L — ABNORMAL HIGH (ref 22–32)
TCO2: 39 mmol/L — ABNORMAL HIGH (ref 22–32)
TCO2: 39 mmol/L — ABNORMAL HIGH (ref 22–32)
TCO2: 39 mmol/L — ABNORMAL HIGH (ref 22–32)
TCO2: 39 mmol/L — ABNORMAL HIGH (ref 22–32)
pCO2 arterial: 54 mmHg — ABNORMAL HIGH (ref 32.0–48.0)
pCO2 arterial: 56.2 mmHg — ABNORMAL HIGH (ref 32.0–48.0)
pCO2 arterial: 56.3 mmHg — ABNORMAL HIGH (ref 32.0–48.0)
pCO2 arterial: 57 mmHg — ABNORMAL HIGH (ref 32.0–48.0)
pCO2 arterial: 57.3 mmHg — ABNORMAL HIGH (ref 32.0–48.0)
pCO2 arterial: 59.4 mmHg — ABNORMAL HIGH (ref 32.0–48.0)
pCO2 arterial: 60.4 mmHg — ABNORMAL HIGH (ref 32.0–48.0)
pCO2 arterial: 61.9 mmHg — ABNORMAL HIGH (ref 32.0–48.0)
pCO2 arterial: 61.9 mmHg — ABNORMAL HIGH (ref 32.0–48.0)
pCO2 arterial: 63.3 mmHg — ABNORMAL HIGH (ref 32.0–48.0)
pCO2 arterial: 64.1 mmHg — ABNORMAL HIGH (ref 32.0–48.0)
pCO2 arterial: 65.9 mmHg (ref 32.0–48.0)
pH, Arterial: 7.352 (ref 7.350–7.450)
pH, Arterial: 7.352 (ref 7.350–7.450)
pH, Arterial: 7.365 (ref 7.350–7.450)
pH, Arterial: 7.377 (ref 7.350–7.450)
pH, Arterial: 7.377 (ref 7.350–7.450)
pH, Arterial: 7.385 (ref 7.350–7.450)
pH, Arterial: 7.387 (ref 7.350–7.450)
pH, Arterial: 7.406 (ref 7.350–7.450)
pH, Arterial: 7.412 (ref 7.350–7.450)
pH, Arterial: 7.416 (ref 7.350–7.450)
pH, Arterial: 7.418 (ref 7.350–7.450)
pH, Arterial: 7.427 (ref 7.350–7.450)
pO2, Arterial: 63 mmHg — ABNORMAL LOW (ref 83.0–108.0)
pO2, Arterial: 64 mmHg — ABNORMAL LOW (ref 83.0–108.0)
pO2, Arterial: 65 mmHg — ABNORMAL LOW (ref 83.0–108.0)
pO2, Arterial: 75 mmHg — ABNORMAL LOW (ref 83.0–108.0)
pO2, Arterial: 77 mmHg — ABNORMAL LOW (ref 83.0–108.0)
pO2, Arterial: 78 mmHg — ABNORMAL LOW (ref 83.0–108.0)
pO2, Arterial: 81 mmHg — ABNORMAL LOW (ref 83.0–108.0)
pO2, Arterial: 82 mmHg — ABNORMAL LOW (ref 83.0–108.0)
pO2, Arterial: 83 mmHg (ref 83.0–108.0)
pO2, Arterial: 84 mmHg (ref 83.0–108.0)
pO2, Arterial: 87 mmHg (ref 83.0–108.0)
pO2, Arterial: 89 mmHg (ref 83.0–108.0)

## 2019-10-07 LAB — CBC
HCT: 26.8 % — ABNORMAL LOW (ref 39.0–52.0)
HCT: 27.5 % — ABNORMAL LOW (ref 39.0–52.0)
Hemoglobin: 7.4 g/dL — ABNORMAL LOW (ref 13.0–17.0)
Hemoglobin: 7.6 g/dL — ABNORMAL LOW (ref 13.0–17.0)
MCH: 26.9 pg (ref 26.0–34.0)
MCH: 27.4 pg (ref 26.0–34.0)
MCHC: 27.6 g/dL — ABNORMAL LOW (ref 30.0–36.0)
MCHC: 27.6 g/dL — ABNORMAL LOW (ref 30.0–36.0)
MCV: 97.5 fL (ref 80.0–100.0)
MCV: 99.3 fL (ref 80.0–100.0)
Platelets: 104 10*3/uL — ABNORMAL LOW (ref 150–400)
Platelets: 125 10*3/uL — ABNORMAL LOW (ref 150–400)
RBC: 2.75 MIL/uL — ABNORMAL LOW (ref 4.22–5.81)
RBC: 2.77 MIL/uL — ABNORMAL LOW (ref 4.22–5.81)
RDW: 18.1 % — ABNORMAL HIGH (ref 11.5–15.5)
RDW: 18.2 % — ABNORMAL HIGH (ref 11.5–15.5)
WBC: 8.4 10*3/uL (ref 4.0–10.5)
WBC: 12.5 10*3/uL — ABNORMAL HIGH (ref 4.0–10.5)
nRBC: 0 % (ref 0.0–0.2)
nRBC: 0.2 % (ref 0.0–0.2)

## 2019-10-07 LAB — GLUCOSE, CAPILLARY
Glucose-Capillary: 70 mg/dL (ref 70–99)
Glucose-Capillary: 90 mg/dL (ref 70–99)
Glucose-Capillary: 138 mg/dL — ABNORMAL HIGH (ref 70–99)
Glucose-Capillary: 69 mg/dL — ABNORMAL LOW (ref 70–99)
Glucose-Capillary: 157 mg/dL — ABNORMAL HIGH (ref 70–99)
Glucose-Capillary: 151 mg/dL — ABNORMAL HIGH (ref 70–99)
Glucose-Capillary: 148 mg/dL — ABNORMAL HIGH (ref 70–99)

## 2019-10-07 LAB — PHOSPHORUS: Phosphorus: 3.6 mg/dL (ref 2.5–4.6)

## 2019-10-07 LAB — BASIC METABOLIC PANEL
Anion gap: 4 — ABNORMAL LOW (ref 5–15)
BUN: 69 mg/dL — ABNORMAL HIGH (ref 6–20)
CO2: 34 mmol/L — ABNORMAL HIGH (ref 22–32)
Calcium: 8.3 mg/dL — ABNORMAL LOW (ref 8.9–10.3)
Chloride: 107 mmol/L (ref 98–111)
Creatinine, Ser: 0.54 mg/dL — ABNORMAL LOW (ref 0.61–1.24)
GFR calc Af Amer: >60 mL/min (ref >60)
GFR calc non Af Amer: >60 mL/min (ref >60)
Glucose, Bld: 75 mg/dL (ref 70–99)
Potassium: 4.7 mmol/L (ref 3.5–5.1)
Sodium: 145 mmol/L (ref 135–145)

## 2019-10-07 LAB — RENAL FUNCTION PANEL
Albumin: 1.9 g/dL — ABNORMAL LOW (ref 3.5–5.0)
Anion gap: 7 (ref 5–15)
BUN: 68 mg/dL — ABNORMAL HIGH (ref 6–20)
CO2: 34 mmol/L — ABNORMAL HIGH (ref 22–32)
Calcium: 8.9 mg/dL (ref 8.9–10.3)
Chloride: 106 mmol/L (ref 98–111)
Creatinine, Ser: 0.64 mg/dL (ref 0.61–1.24)
GFR calc Af Amer: >60 mL/min (ref >60)
GFR calc non Af Amer: >60 mL/min (ref >60)
Glucose, Bld: 74 mg/dL (ref 70–99)
Phosphorus: 3.6 mg/dL (ref 2.5–4.6)
Potassium: 4.7 mmol/L (ref 3.5–5.1)
Sodium: 147 mmol/L — ABNORMAL HIGH (ref 135–145)

## 2019-10-07 LAB — ECHOCARDIOGRAM COMPLETE
Height: 68 in
Weight: 4691.39 oz

## 2019-10-07 LAB — COOXEMETRY PANEL
Carboxyhemoglobin: 3.2 % — ABNORMAL HIGH (ref 0.5–1.5)
Methemoglobin: 1.1 % (ref 0.0–1.5)
O2 Saturation: 76.2 %
Total hemoglobin: 7.2 g/dL — ABNORMAL LOW (ref 12.0–16.0)

## 2019-10-07 LAB — LACTATE DEHYDROGENASE: LDH: 379 U/L — ABNORMAL HIGH (ref 98–192)

## 2019-10-07 LAB — APTT
aPTT: 93 seconds — ABNORMAL HIGH (ref 24–36)
aPTT: 79 seconds — ABNORMAL HIGH (ref 24–36)

## 2019-10-07 LAB — MAGNESIUM: Magnesium: 1.9 mg/dL (ref 1.7–2.4)

## 2019-10-07 LAB — HEPARIN LEVEL (UNFRACTIONATED)
Heparin Unfractionated: 0.38 IU/mL (ref 0.30–0.70)
Heparin Unfractionated: 0.29 IU/mL — ABNORMAL LOW (ref 0.30–0.70)

## 2019-10-07 LAB — LACTIC ACID, PLASMA: Lactic Acid, Venous: 0.7 mmol/L (ref 0.5–1.9)

## 2019-10-07 LAB — FIBRINOGEN: Fibrinogen: 334 mg/dL (ref 210–475)

## 2019-10-07 MED ORDER — FUROSEMIDE 10 MG/ML IJ SOLN
40.0000 mg | Freq: Once | INTRAMUSCULAR | Status: AC
  Administered 2019-10-07: 40 mg via INTRAVENOUS
  Filled 2019-10-07: qty 4

## 2019-10-07 MED ORDER — MAGNESIUM SULFATE 2 GM/50ML IV SOLN
2.0000 g | Freq: Once | INTRAVENOUS | Status: AC
  Administered 2019-10-07: 2 g via INTRAVENOUS
  Filled 2019-10-07: qty 50

## 2019-10-07 NOTE — Progress Notes (Signed)
  Echocardiogram 2D Echocardiogram has been performed.  Jennette Dubin 10/07/2019, 10:33 AM

## 2019-10-07 NOTE — Progress Notes (Signed)
ABG sample drawn from CVP line verse arterial line in error.   1616 ABG results incorrect, correct results at 1621.     Correct ABG results:   Results for ALOYSIUS, HEINLE (MRN [id number]) as of 10/07/2019 17:06  Ref. Range 10/07/2019 16:21  Sample type Unknown ARTERIAL  pH, Arterial Latest Ref Range: 7.350 - 7.450  7.427  pCO2 arterial Latest Ref Range: 32.0 - 48.0 mmHg 57.3 (H)  pO2, Arterial Latest Ref Range: 83.0 - 108.0 mmHg 63.0 (L)  TCO2 Latest Ref Range: 22 - 32 mmol/L 39 (H)  Acid-Base Excess Latest Ref Range: 0.0 - 2.0 mmol/L 12.0 (H)  Bicarbonate Latest Ref Range: 20.0 - 28.0 mmol/L 37.7 (H)  O2 Saturation Latest Units: % 91.0  Patient temperature Unknown 98.8 F

## 2019-10-07 NOTE — Progress Notes (Signed)
ANTICOAGULATION CONSULT NOTE - Follow Up Consult  Pharmacy Consult for Heparin Indication: ECMO  Labs: Recent Labs    10/05/19 0355 10/05/19 0356 10/06/19 0353 10/06/19 0407 10/06/19 1606 10/06/19 1656 10/07/19 0312 10/07/19 0317 10/07/19 0406 10/07/19 0702 10/07/19 0702 10/07/19 0806 10/07/19 1142  HGB 8.1*   < > 7.8*   < > 8.1*   < > 7.4*  --    < > 8.2*   < > 8.8* 8.8*  HCT 27.6*   < > 27.4*   < > 28.8*   < > 26.8*  --    < > 24.0*  --  26.0* 26.0*  PLT 107*   < > 103*  --  125*  --  104*  --   --   --   --   --   --   APTT 86*   < > 72*  --  73*  --  93*  --   --   --   --   --   --   HEPARINUNFRC  --    < > 0.32  --  0.37  --  0.38  --   --   --   --   --   --   CREATININE 0.84  --  0.63  --   --   --   --  0.64  --   --   --   --   --    < > = values in this interval not displayed.    Assessment: [name] on ECMO.  Heparin level therapeutic at 0.38, on 1650 units/hr. Hgb 8.8 >7.4 > 8.8, plt stable ~ 100 (trending up from earlier slightly). No s/sx of bleeding or infusion issues per RN.  Transfuse when Hgb < 7. LDH and fibrinogen stable.   Goal of Therapy:  Heparin level 0.3-0.4 units/ml   Plan:  Continue IV heparin at 1650 units/hr. Heparin levels and CBC with routine ECMO labs. Monitor for bleeding  [name], [facility] Clinical Pharmacist Phone [phone]  10/07/2019 1:57 PM

## 2019-10-07 NOTE — Progress Notes (Signed)
NAME:  [name], MRN:  [id number], DOB:  07-Feb-1976, LOS: 17 ADMISSION DATE:  07/22/2019, CONSULTATION DATE:  12/24 REFERRING MD:  Sloan Leiter, CHIEF COMPLAINT:  Dyspnea   Brief History   44 y/o male admitted 12/14 with COVID pneumonia leading to ARDS, pneumomediastinum.  Intubated 12/26, bilateral chest tubes placed.  Past Medical History  GERD HTN Asthma  Significant Hospital Events   12/14 admit with hypoxemic respiratory failure in setting of COVID-19 pneumonia 12/23 noted to have pneumomediastinum on chest x-ray 12/24PCCM consulted for evaluation 12/26 worsening hypoxemia, intubated 12/27 VV fem/fem ECMO cannulation 12/29 tracheostomy >> See bedside nursing event notes for full rundown of procedures after 12/27 1/5 epistaxis, nasal packing 1/30 improved cxr, improved lung compliance, TV 350s on 15PC  1/31 CXR increased infiltrates, +CFB     2/2 Converted to right IJ Crescent cannula. 2/3 PEG tube inserted at bedside 2/14 30 min sweep trial> paO2 105 (18 PEEP, 60% FiO2) pH 7.22, pCO2 87 2/20 2 hours sweep trial 2/21 1.5 hour sweep trial 2/24 7-hour sweep trial.  Increasingly awake.  Consults:  PCCM, CTS, CHF, Palliative  Procedures:  PICC 1/3 >> R radial a-line 1/20 >> R IJ ECMO cath 2/3 >> Tracheostomy 12/29 >>  Significant Diagnostic Tests:  CT chest 12/22>>extensive pneumomediastinum, multifocal patchy bilateral groundglass opacities CXR 1/25 >> improving bilateral infiltrates. ECMO cannulae in place. CXR 2/5 >> clearing bilateral infiltrates  Micro Data:  BCx2 12/14 >>negative  Sputum 12/15 >> OPF 12/28 blood > negative 12/29 > staph aureus 1/4 bronch:  1/2 acid fast smear bronch: negative 1/2 fungal cx bronch: negative1/12 BAL: serratia, staph epidermidisu 1/4 BAL > MSSA, Serratia 1/9 blood > negative 1/9 Resp culture > MSSA, serratia 1/11 serratia > negative 1/12 fungus culture > negative 1/12 BAL > serratia 1/18 BAL >> rare  Serratia. 1/24 blood > negative 1/30 blood > negative 1/30 resp culture > serratia 2/1 BAL >> rare Serratia - enterococcus 2/1 aspergillus Ag > negative 2/2 resp culture > serratia  2/2 blood > negative 2/5 urine > negative 2/9 resp: serratia 2/15 BAL serratia  Antimicrobials:  Received remdesivir, steroids, actemra and convalescent plasma  5 days cefepime 12/24-12/29 Cefepime 1/4->1/13 vanc 1/4> 1/11 Ceftriaxone 1/14 > 1/27 vanc x1 1/20 Cefazolin 1/29 > 2/2 cefuroxim 2/2 vanc 2/2 Ceftriaxone x1 2/4 Meropenem 2/15>>2/21 Tobramycin inhaled 2/15>>2/21  Interim history/subjective:  On hour 20 of sweep trial. Wants to get our of bed and go home. Bothered by neck cannula.  Objective   Blood pressure (!) 132/55, pulse 93, temperature 99 F (37.2 C), temperature source Oral, resp. rate (!) 36, height _0  (1.727 m), weight 133 kg, SpO2 92 %. CVP:  [2 mmHg-14 mmHg] 5 mmHg  Vent Mode: PCV FiO2 (%):  [40 %] 40 % Set Rate:  [20 bmp] 20 bmp PEEP:  [10 cmH20] 10 cmH20 Plateau Pressure:  [27 cmH20-32 cmH20] 30 cmH20   Intake/Output Summary (Last 24 hours) at 10/07/2019 3662 Last data filed at 10/07/2019 0900 Gross per 24 hour  Intake 3381.95 ml  Output 3055 ml  Net 326.95 ml   Filed Weights   10/05/19 0500 10/06/19 0500 10/07/19 0500  Weight: 135.4 kg 134.8 kg 133 kg    Examination: GEN: ill appearing man on vent HEENT: RIJ catheter CDI CV: RRR, ext warm PULM: Coarse bilaterally, shallow GI: Soft, PEG CDI EXT: Anasarca still present NEURO: follows commands more briskly today,strength improving as patient moving thighs PSYCH: RASS 0  SKIN: Pale  Gas/Vent/Sweep/Circuit look okay  CBGs low CXR about the same yesterday with bilateral airspace disease, question RUL apical subsegment down Net -1.7L yesterday  Resolved Hospital Problem list   Pneumothoraces-resolved.  Assessment & Plan:   ARDS due to COVID 19, requiring mechanical ventilation and VV ECMO  - Improving,  lingering issues with HCAP vs. Bronchitis and profound deconditioning. - Continue daily sweep trials - For potential decannulation 3/1  AKI with azotemia - improving  Positive cumulative fluid balance Hypernatremia has corrected Severe third spacing - Will diurese today   Hypertension - clonidine, amlodipine and propanolol and hydralazine;  Serratia pneumonia> treated Stop date 2/22  Neuro muscular weakness Appreciate PT input.  For range of motion exercises.  Improving. - Trial of testosterone per previous discussions  Bilateral lower feet deep tissue injury Wound care following  Hyperglycemia- now hypoglycemia - Drop levemir from 18 units BID to 12 units BID, q4h tube feed + SSI   Best practice:  Pain/Anxiety/Delirium protocol (if indicated): PRNs only VAP protocol (if indicated): Bundle in place. Respiratory support goals: Continue current ultra lung protective strategy.  DVT prophylaxis: Heparin drip, goal 0.3-0.5 GI prophylaxis: PPI Nutrition: TF Fluid status goals:  See above Urinary catheter: Guide hemodynamic management Glucose control: controlled with current regimen Mobility/therapy needs: Bedrest.  Daily labs: as per ECMO protocol. Code Status: Full code  Disposition: ICU  Labs   CBC: CBC Latest Ref Rng & Units 10/07/2019 10/07/2019 10/07/2019  WBC 4.0 - 10.5 K/uL - - -  Hemoglobin 13.0 - 17.0 g/dL 8.8(L) 8.2(L) 8.2(L)  Hematocrit 39.0 - 52.0 % 26.0(L) 24.0(L) 24.0(L)  Platelets 150 - 400 K/uL - - -     Basic Metabolic Panel: BMP Latest Ref Rng & Units 10/07/2019 10/07/2019 10/07/2019  Glucose 70 - 99 mg/dL - - -  BUN 6 - 20 mg/dL - - -  Creatinine 0.61 - 1.24 mg/dL - - -  Sodium 135 - 145 mmol/L 147(H) 146(H) 146(H)  Potassium 3.5 - 5.1 mmol/L 4.7 4.6 4.6  Chloride 98 - 111 mmol/L - - -  CO2 22 - 32 mmol/L - - -  Calcium 8.9 - 10.3 mg/dL - - -     GFR: Estimated Creatinine Clearance: 155.4 mL/min (by C-G formula based on SCr of 0.64 mg/dL). Recent  Labs  Lab 10/04/19 0340 10/04/19 1628 10/05/19 0355 10/05/19 0355 10/05/19 1600 10/06/19 0353 10/06/19 1606 10/07/19 0312 10/07/19 0317  WBC 8.9   < > 10.8*   < > 13.0* 11.2* 13.8* 8.4  --   LATICACIDVEN 1.5  --  1.1  --   --  1.2  --   --  0.7   < > = values in this interval not displayed.    Liver Function Tests: Recent Labs  Lab 10/06/19 0353 10/07/19 0317  ALBUMIN 1.9* 1.9*   No results for input(s): LIPASE, AMYLASE in the last 168 hours. No results for input(s): AMMONIA in the last 168 hours.  ABG    Component Value Date/Time   PHART 7.416 10/07/2019 0806   PCO2ART 54.0 (H) 10/07/2019 0806   PO2ART 64.0 (L) 10/07/2019 0806   HCO3 34.8 (H) 10/07/2019 0806   TCO2 36 (H) 10/07/2019 0806   ACIDBASEDEF 4.0 (H) 09/16/2019 2334   O2SAT 92.0 10/07/2019 0806     Coagulation Profile: No results for input(s): INR, PROTIME in the last 168 hours.  Cardiac Enzymes: No results for input(s): CKTOTAL, CKMB, CKMBINDEX, TROPONINI in the last 168 hours.  HbA1C: Hgb A1c MFr Bld  Date/Time Value Ref  Range Status  09/23/2019 08:58 AM 5.5 4.8 - 5.6 % Final    Comment:    (NOTE) Pre diabetes:          5.7%-6.4% Diabetes:              >6.4% Glycemic control for   <7.0% adults with diabetes   07/24/2019 04:50 AM 6.7 (H) 4.8 - 5.6 % Final    Comment:    (NOTE) Pre diabetes:          5.7%-6.4% Diabetes:              >6.4% Glycemic control for   <7.0% adults with diabetes     CBG: Recent Labs  Lab 10/06/19 1109 10/06/19 1559 10/06/19 1948 10/06/19 2257 10/07/19 0306  GLUCAP 103* 105* 118* 157* 70   This patient is critically ill with multiple organ system failure; which, requires frequent high complexity decision making, assessment, support, evaluation, and titration of therapies. This was completed through the application of advanced monitoring technologies and extensive interpretation of multiple databases. During this encounter critical care time was devoted to  patient care services described in this note for 35 minutes.  Kipp Brood, MD [facility] 10/07/2019 9:09 AM

## 2019-10-07 NOTE — Progress Notes (Signed)
ANTICOAGULATION CONSULT NOTE - Follow Up Consult  Pharmacy Consult for Heparin Indication: ECMO  Labs: Recent Labs    10/06/19 0353 10/06/19 0407 10/06/19 1606 10/06/19 1656 10/07/19 0312 10/07/19 0317 10/07/19 0406 10/07/19 1610 10/07/19 1610 10/07/19 1616 10/07/19 1616 10/07/19 1621 10/07/19 1935  HGB 7.8*   < > 8.1*   < > 7.4*  --    < > 7.6*   < > 8.2*   < > 8.5* 8.2*  HCT 27.4*   < > 28.8*   < > 26.8*  --    < > 27.5*   < > 24.0*  --  25.0* 24.0*  PLT 103*   < > 125*  --  104*  --   --  125*  --   --   --   --   --   APTT 72*   < > 73*  --  93*  --   --  79*  --   --   --   --   --   HEPARINUNFRC 0.32   < > 0.37  --  0.38  --   --  0.29*  --   --   --   --   --   CREATININE 0.63  --   --   --   --  0.64  --   --   --   --   --  0.54*  --    < > = values in this interval not displayed.    Assessment: 44yo male on ECMO and pharmacy dosing heparin  -heparin level = 0.29  Goal of Therapy:  Heparin level 0.3-0.4 units/ml   Plan:  -No heparin changes now since he is on the borderline of goal -Daily heparin level and CBC   Hildred Laser, PharmD Clinical Pharmacist **Pharmacist phone directory can now be found on [email or web address] ([email or web address]).  Listed under [place].

## 2019-10-07 NOTE — Progress Notes (Signed)
Patient ID: [name], male   DOB: 1976-04-23, 44 y.o.   MRN: [id number]    Advanced Heart Failure Rounding Note   Subjective:    Has been on Sweep trial for 20 hours.   BAL growing > 100K serratia. Completed 14 days mero on 2/28  Awake. Follows commands. Very weak UE>LE   Diuresed well yesterday. Weight near baseline   CXR improved today. Personally reviewed  Labs:  7.41/54/64/92% Hgb 8.2 LDH 378 -> 383 -> 385 -> 379 Heparin level: 0.38 Lactic acid 0.7 BUN 68   ECMO parameters - see separate ecmo note   Objective:   Weight Range:  Vital Signs:   Temp:  [98.4 F (36.9 C)-98.6 F (37 C)] 98.4 F (36.9 C) (03/01 0400) Pulse Rate:  [81-102] 92 (03/01 0747) Resp:  [19-38] 30 (03/01 0747) BP: (132-175)/(55-99) 132/55 (03/01 0356) SpO2:  [95 %-100 %] 100 % (03/01 0615) Arterial Line BP: (113-185)/(34-70) 159/69 (03/01 0615) FiO2 (%):  [40 %] 40 % (03/01 0747) Weight:  [133 kg] 133 kg (03/01 0500) Last BM Date: 10/05/19  Weight change: Filed Weights   10/05/19 0500 10/06/19 0500 10/07/19 0500  Weight: 135.4 kg 134.8 kg 133 kg    Intake/Output:   Intake/Output Summary (Last 24 hours) at 10/07/2019 0175 Last data filed at 10/07/2019 0800 Gross per 24 hour  Intake 3573.65 ml  Output 3315 ml  Net 258.65 ml     Physical Exam: General:  Awake on vent/ECMO. Following commands but unable to move arms much. moving legs better HEENT: normal Neck: supple. RIJ cannula + trach  Carotids 2+ bilat; no bruits. No lymphadenopathy or thryomegaly appreciated. Cor: PMI nondisplaced. Regular rate & rhythm. No rubs, gallops or murmurs. Lungs: clear Abdomen: obese soft, nontender, nondistended. + PEG tube No hepatosplenomegaly. No bruits or masses. Good bowel sounds. Extremities: no cyanosis, clubbing, rash, edema + DTIs on feet Neuro: alert & orientedx3, cranial nerves grossly intact. moves all 4 extremities w/o difficulty. Affect pleasant  Neuro: Awake on  vent/ECMO. Following commands but unable to move arms much. moving legs better   Telemetry: Sinus 90-100s Personally reviewed   Labs: Basic Metabolic Panel: Recent Labs  Lab 10/03/19 0315 10/03/19 0417 10/03/19 1619 10/03/19 1700 10/04/19 0340 10/04/19 0517 10/05/19 0355 10/05/19 0419 10/06/19 0353 10/06/19 0407 10/07/19 0309 10/07/19 1025 10/07/19 8527 10/07/19 7824 10/07/19 0406 10/07/19 0504 10/07/19 2353 10/07/19 0702 10/07/19 0806  NA 145   < > 143   < > 143   < > 143   < > 144   < >   < >  --  147*   < > 148* 147* 146* 146* 147*  K 4.6   < > 4.7   < > 4.9   < > 4.6   < > 4.5   < >   < >  --  4.7   < > 4.6 4.6 4.6 4.6 4.7  CL 108   < > 106  --  106  --  109  --  105  --   --   --  106  --   --   --   --   --   --   CO2 30   < > 30  --  29  --  30  --  32  --   --   --  34*  --   --   --   --   --   --   GLUCOSE  95   < > 60*  --  123*  --  69*  --  74  --   --   --  74  --   --   --   --   --   --   BUN 86*   < > 92*  --  82*  --  76*  --  70*  --   --   --  68*  --   --   --   --   --   --   CREATININE 0.69   < > 0.72  --  0.68  --  0.84  --  0.63  --   --   --  0.64  --   --   --   --   --   --   CALCIUM 8.7*   < > 8.7*   < > 8.5*   < > 8.5*  --  8.7*  --   --   --  8.9  --   --   --   --   --   --   MG 2.2  --   --   --  2.2  --  2.1  --  2.0  --   --  1.9  --   --   --   --   --   --   --   PHOS 3.8   < >  --   --  3.6  --  3.5  --  3.0  3.3  --   --  3.6 3.6  --   --   --   --   --   --    < > = values in this interval not displayed.    Liver Function Tests: Recent Labs  Lab 10/06/19 0353 10/07/19 0317  ALBUMIN 1.9* 1.9*   No results for input(s): LIPASE, AMYLASE in the last 168 hours. No results for input(s): AMMONIA in the last 168 hours.  CBC: Recent Labs  Lab 10/05/19 0355 10/05/19 0419 10/05/19 1600 10/05/19 1641 10/06/19 0353 10/06/19 0407 10/06/19 1606 10/06/19 1656 10/07/19 0312 10/07/19 0312 10/07/19 0406 10/07/19 0504  10/07/19 0608 10/07/19 0702 10/07/19 0806  WBC 10.8*  --  13.0*  --  11.2*  --  13.8*  --  8.4  --   --   --   --   --   --   HGB 8.1*   < > 8.1*   < > 7.8*   < > 8.1*   < > 7.4*   < > 8.2* 7.8* 8.2* 8.2* 8.8*  HCT 27.6*   < > 28.5*   < > 27.4*   < > 28.8*   < > 26.8*   < > 24.0* 23.0* 24.0* 24.0* 26.0*  MCV 95.2  --  96.6  --  96.5  --  97.6  --  97.5  --   --   --   --   --   --   PLT 107*  --  112*  --  103*  --  125*  --  104*  --   --   --   --   --   --    < > = values in this interval not displayed.    Cardiac Enzymes: No results for input(s): CKTOTAL, CKMB, CKMBINDEX, TROPONINI in the last 168 hours.  BNP: BNP (last 3 results) Recent Labs    07/22/19 1039  BNP 15.3  ProBNP (last 3 results) No results for input(s): PROBNP in the last 8760 hours.    Other results:  Imaging: DG CHEST PORT 1 VIEW  Result Date: 10/07/2019 CLINICAL DATA:  Tracheostomy, COVID EXAM: PORTABLE CHEST 1 VIEW COMPARISON:  October 06, 2019 FINDINGS: The heart size and mediastinal contours are unchanged with mild cardiomegaly. Tracheostomy at the level of the clavicular heads. Again noted are multifocal patchy airspace opacities throughout both lungs, however there is slight interval improvement in degree of aeration. No pleural effusion. No acute osseous abnormality. IMPRESSION: Slight interval improvement in multifocal airspace opacities. Electronically Signed   By: Prudencio Pair M.D.   On: 10/07/2019 05:56   DG CHEST PORT 1 VIEW  Result Date: 10/06/2019 CLINICAL DATA:  ECMO EXAM: PORTABLE CHEST 1 VIEW COMPARISON:  10/05/2019 FINDINGS: Tracheostomy tube again noted. ECMO cannula is similar. Cardiopericardial silhouette is enlarged. Diffuse patchy bilateral airspace disease shows no substantial interval change. No pleural effusion. The visualized bony structures of the thorax are intact. Telemetry leads overlie the chest. IMPRESSION: Stable exam. Electronically Signed   By: Misty Stanley M.D.   On:  10/06/2019 08:56     Medications:     Scheduled Medications: . sodium chloride   Intravenous Once  . acetaminophen (TYLENOL) oral liquid 160 mg/5 mL  650 mg Per Tube Q6H  . amiodarone  200 mg Per Tube Daily  . amLODipine  10 mg Per Tube Daily  . vitamin C  500 mg Per Tube Daily  . chlorhexidine gluconate (MEDLINE KIT)  15 mL Mouth Rinse BID  . Chlorhexidine Gluconate Cloth  6 each Topical Daily  . cloNIDine  0.2 mg Per Tube Q6H  . feeding supplement (OSMOLITE 1.5 CAL)  237 mL Per Tube 6 X Daily  . feeding supplement (PRO-STAT SUGAR FREE 64)  60 mL Per Tube QID  . free water  300 mL Per Tube Q6H  . Gerhardt's butt cream   Topical QID  . hydrALAZINE  100 mg Per Tube Q8H  . insulin aspart  0-20 Units Subcutaneous Q4H  . insulin aspart  5 Units Subcutaneous Q4H  . insulin detemir  12 Units Subcutaneous BID  . ipratropium-albuterol  3 mL Nebulization Q4H  . magic mouthwash  5 mL Oral TID  . mouth rinse  15 mL Mouth Rinse 10 times per day  . montelukast  10 mg Per Tube QHS  . nutrition supplement (JUVEN)  1 packet Per Tube BID BM  . pantoprazole sodium  40 mg Per Tube BID  . propranolol  80 mg Per Tube TID  . psyllium  1 packet Per Tube BID  . QUEtiapine  50 mg Per Tube QHS  . sodium chloride flush  10-40 mL Intracatheter Q12H  . testosterone  5 g Transdermal Daily  . white petrolatum   Topical BID    Infusions: . sodium chloride    . sodium chloride Stopped (09/30/19 0734)  . sodium chloride 10 mL/hr at 10/07/19 0400  . dexmedetomidine (PRECEDEX) IV infusion 0.4 mcg/kg/hr (10/07/19 0400)  . heparin 1,650 Units/hr (10/07/19 0557)  . niCARDipine 5 mg/hr (10/07/19 0745)    PRN Medications: Place/Maintain arterial line **AND** sodium chloride, sodium chloride, sodium chloride, acetaminophen (TYLENOL) oral liquid 160 mg/5 mL, acetaminophen, [COMPLETED] diazepam **FOLLOWED BY** diazepam, hydrALAZINE, HYDROmorphone, magnesium hydroxide, midazolam, ondansetron (ZOFRAN) IV,  senna-docusate, sodium chloride, sodium chloride flush   Assessment/Plan:    1. Acute hypoxic/hypercarbic respiratory failure due to COVID PNA: Remained markedly hypoxic despite full support. Intubated 12/26.  S/p bilateral CTs 12/26 for pneumothoraces.  TEE on 12/26 LVEF 70% RV ok. VV ECMO begun 12/26. Tracheostomy 12/29.  COVID ARDS at this point. ECMO circuit changed 1/8 & 1/23. Crescent cannula placed 2/2 (femoral cannuals removed) - ECMO parameters as per ECMO note - Flows look good on Crescent - Developed PNA and sepsis on 2/15.  BAL with > 100K serratia. Finished nebulized tobra. On mero. Completed day 14/14 of meropenum on 2/28 - Neuro has seen and I discussed with them. 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 Continue passive ROM exercises with PT. Supportive care - Now off sweep x 20 hours. Looks good ABG ok  - Possible decannulation tomorrow - LDH ok - Continue heparin. Level 0.38 Discussed dosing with PharmD personally. - Continue chest PT  2. Bilateral PTX: Due to barotrauma.  - Resolved. CTs out  3. COVID PNA: management of COVID infection per CCM. CXR with bilateral multifocal PNA progressing to likely ARDS.  - s/p 10 days of remdesivir - 12/16, 12/2 s/p tociluzimab - 12/17 s/p convalescent plasma - Decadron at 78m/daily completed 1/11 - Completed cefepime/vanc - Off Ancef  4. ID: Empiric coverage with vancomycin/cefepime initially for possible secondary bacterial PNA, course completed.   - treated with cefepime and vanc for + cx with staph epidimidis, MSSA and serratia (sensitivities ok). - developed recurrent PNA and sepsis 2/15 - BAL with > 100K serratia. BAL growing > 100K serratia. Completed 14 days mero on 2/28  5. Anemia: - hgb 8.8 this am.Will transfuse as needed to keep Hgb> 8.0 - No change  7. FEN - now with G-tube.Continue TFs  8. Hypernatremia - Na 147 - Continue FW at current dose  9. AKI - Volume status improved.  - BUN stable at 68  10.  DTIs - WOC following. Has evolving wound on left 5th toe. Continue to follow  Plan:  Off sweep gas x 20 hours. Will plan decannulation tomorrow am.  Diurese gently. Continue PROM exercises with PT.  NPO after MN  Case discussed in detail in multidisciplinary ECMO rounds.  Length of Stay: 738 CRITICAL CARE Performed by: BGlori Bickers Total critical care time: 35 minutes  Critical care time was exclusive of separately billable procedures and treating other patients.  Critical care was necessary to treat or prevent imminent or life-threatening deterioration.  Critical care was time spent personally by me (independent of midlevel providers or residents) on the following activities: development of treatment plan with patient and/or surrogate as well as nursing, discussions with consultants, evaluation of patient's response to treatment, examination of patient, obtaining history from patient or surrogate, ordering and performing treatments and interventions, ordering and review of laboratory studies, ordering and review of radiographic studies, pulse oximetry and re-evaluation of patient's condition.    DGlori BickersMD 10/07/2019, 8:22 AM  Advanced Heart Failure Team Pager [phone](M-F; 7[place]  Please contact [facility] for night-coverage after hours (4p -7a ) and weekends on [email or web address]

## 2019-10-07 NOTE — Progress Notes (Signed)
OT Treatment Note  Pt with increased alertness and good participation with therapy. Seen on 10 peep; FiO2 40% with perfusion specialist and nsg present. VSS during session. Able to use Egress feature of bed to work on strengthening and endurance. Pt more consistently following commands with delay. Asked nsg to keep BUE elevated on at least 2 pillows to reduce dependent edema. Pt will most likely need rehab at [facility]. Will continue to follow acutely.    10/07/19 1800  OT Visit Information  Last OT Received On 10/07/19  Assistance Needed +3 or more  PT/OT/SLP Co-Evaluation/Treatment Yes  Reason for Co-Treatment Complexity of the patient's impairments (multi-system involvement);For patient/therapist safety  OT goals addressed during session ADL's and self-care;Strengthening/ROM  History of Present Illness 44 y/o M admitted 12/14 with COVID pneumonia causing acute hypoxemic respiratory failure.   Developed pneumomediastinum 12/23 with concern for possible bilateral small pneumothoraces on 12/24. Pt intubated on 12/26 and started on ECMO on 12/27. Tracheostomy on 12/29 with bleeding from trach an dother sites on 12/31. Bronch on 1/11 and 1/15. Slowly weaning sedation 2/5. Continuing to wean sedation and starting sweep trials to wean ECMO  Precautions  Precautions Fall;Other (comment)  Precaution Comments ECMO R IJ, Vent to trach, G tube, necrosis bil. feet   Required Braces or Orthoses Other Brace  Other Brace B Prevalon  Pain Assessment  Pain Assessment Faces  Faces Pain Scale 4  Pain Location generalized with movement of UEs  Pain Descriptors / Indicators Grimacing  Pain Intervention(s) Limited activity within patient's tolerance;Repositioned  Cognition  Arousal/Alertness Awake/alert  Behavior During Therapy Flat affect  Overall Cognitive Status Difficult to assess  Area of Impairment Attention;Following commands;Safety/judgement;Awareness;Problem solving  Current Attention Level Sustained   Following Commands Follows one step commands inconsistently  Awareness Intellectual  Problem Solving Slow processing;Decreased initiation;Difficulty sequencing;Requires verbal cues;Requires tactile cues  Upper Extremity Assessment  Upper Extremity Assessment RUE deficits/detail;LUE deficits/detail  RUE Deficits / Details unable to lift hand off bed; Able to push with RUE; edematous hand limiting ability to make complete grip; poor scapular glide, limiting shoulder ROM adn strength  RUE Coordination decreased fine motor;decreased gross motor  LUE Deficits / Details similar to RUE  LUE Coordination decreased fine motor;decreased gross motor  Lower Extremity Assessment  Lower Extremity Assessment Defer to PT evaluation  ADL  Overall ADL's  Needs assistance/impaired  General ADL Comments Pt requires total A for all aspects   Bed Mobility  Overal bed mobility Needs Assistance  Bed Mobility Rolling  Rolling +2 for physical assistance;Total assist  Balance  Overall balance assessment Needs assistance  Sitting balance-Leahy Scale Zero  Vision- Assessment  Additional Comments Will further assess; difficulty sustaining visual attention  Exercises  Exercises General Upper Extremity  General Exercises - Upper Extremity  Shoulder Flexion AAROM;Both;10 reps;Supine  Shoulder ABduction AAROM;Both;10 reps;Supine  Elbow Flexion AAROM;Both;10 reps;Seated  Elbow Extension AROM;Both;10 reps;Seated  Digit Composite Flexion AAROM;Both;10 reps;Seated  Composite Extension AROM;AAROM;Both;10 reps;Seated  Shoulder Extension AAROM;Both;10 reps  Other Exercises  Other Exercises Used Egress feature of bed to work on strengthening and endurance. Pt able to assist with pulling forward, howeer poor control of maintaining midlein posture. Pt could spontaneously increased trunk/head extension but unable to hold  OT - End of Session  Equipment Utilized During Treatment Other (comment) (ECMO/vent)  Activity  Tolerance Patient tolerated treatment well  Patient left in bed;with call bell/phone within reach;with nursing/sitter in room  Nurse Communication Mobility status  OT Assessment/Plan  OT Plan Discharge plan remains  appropriate  OT Visit Diagnosis Muscle weakness (generalized) (M62.81);Cognitive communication deficit (R41.841)  OT Frequency (ACUTE ONLY) Min 3X/week  Follow Up Recommendations LTACH  OT Equipment Other (comment) (TBA)  AM-PAC OT "6 Clicks" Daily Activity Outcome Measure (Version 2)  Help from another person eating meals? 1  Help from another person taking care of personal grooming? 1  Help from another person toileting, which includes using toliet, bedpan, or urinal? 1  Help from another person bathing (including washing, rinsing, drying)? 1  Help from another person to put on and taking off regular upper body clothing? 1  Help from another person to put on and taking off regular lower body clothing? 1  6 Click Score 6  OT Goal Progression  Progress towards OT goals Progressing toward goals;OT to reassess next treatment  Acute Rehab OT Goals  Patient Stated Goal unable to state  OT Goal Formulation Patient unable to participate in goal setting  Time For Goal Achievement 10/18/19  Potential to Achieve Goals Fair  ADL Goals  Additional ADL Goal #1 Pt will be able to follow one step commands consistently 50% of the time.  Additional ADL Goal #2 Pt will sustain attention to activity x 1 min with min cues  Additional ADL Goal #3 Pt will be able to move Lt UE to mouth with mod A  OT Time Calculation  OT Start Time (ACUTE ONLY) 1619  OT Stop Time (ACUTE ONLY) 1643  OT Time Calculation (min) 24 min  OT General Charges  $OT Visit 1 Visit  OT Treatments  $Neuromuscular Re-education 8-22 mins  Maurie Boettcher, OT/L   Acute OT Clinical Specialist [place] Pager [phone] Office [phone]

## 2019-10-07 NOTE — Progress Notes (Signed)
Physical Therapy Treatment Patient Details Name: [name] MRN: [id number] DOB: June 17, 1976 Today's Date: 10/07/2019    History of Present Illness 44 y/o M admitted 12/14 with COVID pneumonia causing acute hypoxemic respiratory failure.   Developed pneumomediastinum 12/23 with concern for possible bilateral small pneumothoraces on 12/24. Pt intubated on 12/26 and started on ECMO on 12/27. Tracheostomy on 12/29 with bleeding from trach an dother sites on 12/31. Bronch on 1/11 and 1/15. Slowly weaning sedation 2/5. Continuing to wean sedation and starting sweep trials to wean ECMO    PT Comments    Patient progressing slowly towards PT goals. Pt alert and awake today. Session focused on there ex in bed and in chair position. Worked on activating core/trunk musculature by coming into seated position with back unsupported. Able to initiate lumbar extension but not sustain it. Able to follow simple commands inconsistently with delay. Edema present BLEs/feet and UEs/hands. No AROM noted in bilateral ankles. Pain to AROM of shoulders. Plan to possibly decannulate from ECMO tomorrow. Pt on vent to trach with 10 PEEP, 40% Fi02, RR 40s. Will continue to follow and progress as tolerated.   Follow Up Recommendations  LTACH     Equipment Recommendations  Other (comment)(defer to next venue)    Recommendations for Other Services       Precautions / Restrictions Precautions Precautions: Fall;Other (comment) Precaution Comments: ECMO R IJ, Vent to trach, G tube, necrosis bil. feet  Restrictions Weight Bearing Restrictions: No    Mobility  Bed Mobility Overal bed mobility: Needs Assistance Bed Mobility: Rolling;Supine to Sit Rolling: Total assist   Supine to sit: Max assist;+2 for physical assistance;HOB elevated     General bed mobility comments: Total A to roll to right/left to change sheets/finish bath. Able to come into sitting with bed in chair position with assist of 2.  Transfers                     Ambulation/Gait                 Stairs             Wheelchair Mobility    Modified Rankin (Stroke Patients Only)       Balance Overall balance assessment: Needs assistance Sitting-balance support: Feet unsupported;Bilateral upper extremity supported Sitting balance-Leahy Scale: Zero Sitting balance - Comments: Requires assist of 2 for trunk support in sitting (with bed in chair position). Able to initiate forward/backward trunk lean with verbal/tactile cues. Worked on cervical rotation and upright posture. Noted to have initiation of lumbar extension but not able to sustain.                                    Cognition Arousal/Alertness: Awake/alert Behavior During Therapy: Flat affect Overall Cognitive Status: Difficult to assess Area of Impairment: Following commands;Attention;Problem solving                   Current Attention Level: Sustained   Following Commands: Follows one step commands inconsistently     Problem Solving: Requires verbal cues;Requires tactile cues;Decreased initiation;Slow processing General Comments: Alert and awake. Able to intermittently follow simple commands with delay and increased time. Nodding yes/no to questions.      Exercises General Exercises - Lower Extremity Ankle Circles/Pumps: PROM;Both;10 reps;Supine Quad Sets: AAROM;Both;5 reps;Supine Long Arc Quad: PROM;10 reps;Seated;Both    General Comments General comments (skin integrity, edema,  etc.): Pt on ECMO sweep today; PEEP 10, 40% Fi02, RR in 40s.      Pertinent Vitals/Pain Pain Assessment: Faces Faces Pain Scale: Hurts little more Pain Location: generalized with movement of UEs Pain Descriptors / Indicators: Grimacing Pain Intervention(s): Monitored during session    Home Living                      Prior Function            PT Goals (current goals can now be found in the care plan section) Progress  towards PT goals: Progressing toward goals    Frequency    Min 3X/week      PT Plan Current plan remains appropriate;Frequency needs to be updated    Co-evaluation PT/OT/SLP Co-Evaluation/Treatment: Yes Reason for Co-Treatment: Complexity of the patient's impairments (multi-system involvement);To address functional/ADL transfers;Necessary to address cognition/behavior during functional activity;For patient/therapist safety PT goals addressed during session: Mobility/safety with mobility;Balance;Strengthening/ROM        AM-PAC PT "6 Clicks" Mobility   Outcome Measure  Help needed turning from your back to your side while in a flat bed without using bedrails?: Total Help needed moving from lying on your back to sitting on the side of a flat bed without using bedrails?: Total Help needed moving to and from a bed to a chair (including a wheelchair)?: Total Help needed standing up from a chair using your arms (e.g., wheelchair or bedside chair)?: Total Help needed to walk in hospital room?: Total Help needed climbing 3-5 steps with a railing? : Total 6 Click Score: 6    End of Session Equipment Utilized During Treatment: Oxygen(trach/vent and ECMO) Activity Tolerance: Patient tolerated treatment well Patient left: in bed;with call bell/phone within reach;with nursing/sitter in room Nurse Communication: Mobility status PT Visit Diagnosis: Muscle weakness (generalized) (M62.81);Other symptoms and signs involving the nervous system (R29.898);Other abnormalities of gait and mobility (R26.89)     Time: 4076-8088 PT Time Calculation (min) (ACUTE ONLY): 23 min  Charges:  $Therapeutic Activity: 8-22 mins                     Marisa Severin, PT, DPT Acute Rehabilitation Services Pager [phone] Office [place] 10/07/2019, 4:54 PM

## 2019-10-07 NOTE — Progress Notes (Signed)
Nutrition Follow up  DOCUMENTATION CODES:   Obesity unspecified  INTERVENTION:   Continue bolus feedings:  -1 can Osmolite 1.5 six times daily (0000, 0400, 0800, 1200, 1600, 2000) -60 ml Prostat QID -Juven BID -Free water flushes 300 ml Q6 hours- per CCM  Provides: 3090 kcals, 214 grams protein, 1086 ml free water (2286 ml with flushes). Meets 100% of needs.   NUTRITION DIAGNOSIS:   Increased nutrient needs related to acute illness(COVID PNA) as evidenced by estimated needs.  Ongoing  GOAL:   Patient will meet greater than or equal to 90% of their needs   Addressed via TF  MONITOR:   Diet advancement, Skin, TF tolerance, Weight trends, Labs, I & O's  REASON FOR ASSESSMENT:   Consult Enteral/tube feeding initiation and management  ASSESSMENT:   Patient with PMH significant for GERD, HTN, and asthma. Presents this admission with COVID-19 PNA.   12/14- admit 12/23- chest xray revealed pneumomediastinum  12/24- developed bilateral pneumothoraces  12/26- worsening hypoxemia, intubated  12/27- s/p VV fem/fem ECMO cannulation  12/29- s/p trach  1/5- nasal bleeding- packing per CCM 1/7- nasal packing per ENT 1/8- trach exchanged, ECMO circuit changed  1/20- s/p additional RIJ drainage cannula  1/21- Cortrak kinked, replaced by OGT, pt vomited over night 1/22- ECMO circuit exchanged  1/23- nasal endoscopy, control of epistaxis  2/2- TEE, Cortrak removed, change to crescent catheter, removal of femoral lines 2/3- s/p PEG, EGD 2/15- developed recurrent PNA/sepsis   Pt discussed during ICU rounds and with RN.   VV ECMO day 65. Tolerating sweep trial- now 20 hrs. Following commands. CXR improved. Plan for possible decannulation tomorrow. Tolerating bolus feedings. Wounds stable per RN.   Admission weight: 136.1 kg Current weight: 133 kg- stable  Patient remains on ventilator support via trach  MV: 15.2 L/min Temp (24hrs), Avg:98.7 F (37.1 C), Min:98.4 F (36.9  C), Max:99 F (37.2 C)   I/O: -636 ml since 2/15 UOP: 3,215 ml x 24 hrs   Drips: precedex,  Medications: 500 mg Vit C, SS novolog, levemir, metamucil Labs: Na 147 (H) BUN 68- trending down CBG 69-157  Diet Order:   Diet Order            Diet NPO time specified  Diet effective midnight              EDUCATION NEEDS:   Not appropriate for education at this time  Skin:  Skin Assessment: Skin Integrity Issues: Skin Integrity Issues:: DTI, Stage II, Stage III DTI: bilateral heels Stage II: buttocks Stage III: neck from trach ties Incisions: L wrist, nose Other: n/a  Last BM:  3/1  Height:   Ht Readings from Last 1 Encounters:  08/09/19 5' 8"  (1.727 m)    Weight:   Wt Readings from Last 1 Encounters:  10/07/19 133 kg    Ideal Body Weight:  70 kg  BMI:  Body mass index is 44.58 kg/m.  Estimated Nutritional Needs:   Kcal:  3643-8377 kcal (25-30 kcal/kg)  Protein:  180-215 grams  Fluid:  >/= 2.5 L/day   [name] RD, LDN Clinical Nutrition Pager # - [phone]

## 2019-10-07 NOTE — Progress Notes (Signed)
ANTICOAGULATION CONSULT NOTE - Follow Up Consult  Pharmacy Consult for Heparin Indication: ECMO  Labs: Recent Labs    10/05/19 0355 10/05/19 0356 10/06/19 0353 10/06/19 0407 10/06/19 1606 10/06/19 1656 10/07/19 0155 10/07/19 0155 10/07/19 0312 10/07/19 0317 10/07/19 0406  HGB 8.1*   < > 7.8*   < > 8.1*   < > 7.8*   < > 7.4*  --  8.2*  HCT 27.6*   < > 27.4*   < > 28.8*   < > 23.0*  --  26.8*  --  24.0*  PLT 107*   < > 103*  --  125*  --   --   --  104*  --   --   APTT 86*   < > 72*  --  73*  --   --   --  93*  --   --   HEPARINUNFRC  --    < > 0.32  --  0.37  --   --   --  0.38  --   --   CREATININE 0.84  --  0.63  --   --   --   --   --   --  0.64  --    < > = values in this interval not displayed.    Assessment: 44yo male on ECMO.  Heparin level this evening came back therapeutic at 0.31, on 1650 units/hr. Hgb stable at 8.8, plt stable at 112 (trending up from earlier slightly). No s/sx of bleeding or infusion issues per RN - dressing site changed with minimal new bleeding.   Transfuse when Hgb < 7. LDH and fibrinogen stable.   3/1 AM update:  Heparin level remains within therapeutic range Hgb 7.4>>8.2  Goal of Therapy:  Heparin level 0.3-0.4 units/ml   Plan:  Continue IV heparin at 1650 units/hr. Heparin levels and CBC with routine ECMO labs. Monitor for bleeding  Narda Bonds, PharmD, [place] Clinical Pharmacist Phone: [phone]

## 2019-10-07 NOTE — Progress Notes (Signed)
Hypoglycemic Event  CBG: 69  Treatment:  Patient's scheduled bolus feeding due and pro-stat.  RN will administer scheduled feeding and then recheck CBG.   Symptoms: None  Follow-up CBG: Time:1744 CBG Result:157  Possible Reasons for Event: Medication regimen:  Patient receiving SSI and meal coverage due to past hyperglycemia but not having some hypoglycemia  Comments/MD notified: MD not notified.     Doretha Sou A

## 2019-10-07 NOTE — CV Procedure (Signed)
   ECMO NOTE:  Indication: Respiratory failure due to COVID PNA  Initial cannulation date: 08/03/2019  ECMO type: VV ECMO (Cardio-Help)  Dual lumen  Inflow/return cannula:  1) 32 FR Crescent placed 2/2  ECMO events:  Initial cannulation 12/26 Circuit changed and trach exchanged on 1/8.   Underwent addition of a RIJ drainage cannula on 1/20 Circuit changed on 1/22 for rising LDH Had evidence of recirculation and RIJ cannula pulled back on 1/26 19 FR RIJ removed 09/06/19 Underwent change to [facility] catheter with removal of femoral lines 2/2 2/21 2 hour sweep trial  2/22 Sedation weaned off  2/24 7 hour sweep trial  2/25 3 hours of sweep trial 2/26  2.5 hours of sweep trial  2/27 1 hour sweep trial 3/1  Off sweep x 20 hours   Daily data:   Flow 4.82 RPM 3550 dP 41 Pven  -68 Sweep  0 (off x 20 hours) SVO2 73.2%  Labs:  7.41/54/64/92% Hgb 8.2 LDH 378 -> 383 -> 385 -> 379 Heparin level: 0.38 Lactic acid 0.7 BUN 68  Plan:  Off sweep gas x 20 hours. Will plan decannulation tomorrow am.  Diurese gently. Continue PROM exercises with PT.  NPO after MN  Glori Bickers, MD  8:34 AM

## 2019-10-07 NOTE — Progress Notes (Signed)
Patient restless/agitated and repeatedly shaking head back and forth.  RN gave PRN pain medication, repositioned, and provided emotional support and reassurance with minimal improvement in patient restlessness. RN concerned of patient safety due to location of ECMO cannula site and patient agitation.  MD Agarwala at bedside, MD Agarwala advised okay to restart low dose precedex for patient comfort and safety.

## 2019-10-07 NOTE — Anesthesia Preprocedure Evaluation (Addendum)
Anesthesia Evaluation  Patient identified by MRN, date of birth, ID band Patient awake    Reviewed: Allergy & Precautions, NPO status , Patient's Chart, lab work & pertinent test results  Airway Mallampati: Trach  TM Distance: >3 FB Neck ROM: Full    Dental  (+) Teeth Intact, Dental Advisory Given   Pulmonary asthma ,  ARDS due to COVID 19, requiring mechanical ventilation and VV ECMO      + intubated    Cardiovascular hypertension, Normal cardiovascular exam Rhythm:Regular Rate:Normal     Neuro/Psych  Headaches,  Neuromuscular disease    GI/Hepatic Neg liver ROS, GERD  Medicated,  Endo/Other  Morbid obesity  Renal/GU negative Renal ROS     Musculoskeletal  (+) Arthritis ,   Abdominal   Peds  Hematology  (+) Blood dyscrasia (Thrombocytopenia), anemia ,   Anesthesia Other Findings Day of surgery medications reviewed with the patient.  Reproductive/Obstetrics                            Anesthesia Physical Anesthesia Plan  ASA: IV  Anesthesia Plan: General   Post-op Pain Management:    Induction: Intravenous  PONV Risk Score and Plan: 2  Airway Management Planned: Tracheostomy  Additional Equipment: TEE  Intra-op Plan:   Post-operative Plan: Post-operative intubation/ventilation  Informed Consent: I have reviewed the patients History and Physical, chart, labs and discussed the procedure including the risks, benefits and alternatives for the proposed anesthesia with the patient or authorized representative who has indicated his/her understanding and acceptance.     Dental advisory given  Plan Discussed with: CRNA  Anesthesia Plan Comments:       Anesthesia Quick Evaluation

## 2019-10-08 ENCOUNTER — Encounter (HOSPITAL_COMMUNITY)
Admission: EM | Disposition: A | Discharge status: Rehabilitation Facility | Payer: Self-pay | Source: Home / Self Care | Attending: Cardiothoracic Surgery

## 2019-10-08 ENCOUNTER — Inpatient Hospital Stay (HOSPITAL_COMMUNITY): Payer: Managed Care, Other (non HMO) | Admitting: Certified Registered Nurse Anesthetist

## 2019-10-08 ENCOUNTER — Inpatient Hospital Stay (HOSPITAL_COMMUNITY): Payer: Managed Care, Other (non HMO)

## 2019-10-08 ENCOUNTER — Encounter (HOSPITAL_COMMUNITY): Payer: Self-pay | Admitting: Internal Medicine

## 2019-10-08 LAB — POCT I-STAT 7, (LYTES, BLD GAS, ICA,H+H)
Acid-Base Excess: 10 mmol/L — ABNORMAL HIGH (ref 0.0–2.0)
Acid-Base Excess: 11 mmol/L — ABNORMAL HIGH (ref 0.0–2.0)
Acid-Base Excess: 11 mmol/L — ABNORMAL HIGH (ref 0.0–2.0)
Acid-Base Excess: 12 mmol/L — ABNORMAL HIGH (ref 0.0–2.0)
Acid-Base Excess: 12 mmol/L — ABNORMAL HIGH (ref 0.0–2.0)
Acid-Base Excess: 9 mmol/L — ABNORMAL HIGH (ref 0.0–2.0)
Acid-Base Excess: 9 mmol/L — ABNORMAL HIGH (ref 0.0–2.0)
Acid-Base Excess: 9 mmol/L — ABNORMAL HIGH (ref 0.0–2.0)
Acid-Base Excess: 9 mmol/L — ABNORMAL HIGH (ref 0.0–2.0)
Acid-Base Excess: 9 mmol/L — ABNORMAL HIGH (ref 0.0–2.0)
Acid-Base Excess: 9 mmol/L — ABNORMAL HIGH (ref 0.0–2.0)
Acid-Base Excess: 9 mmol/L — ABNORMAL HIGH (ref 0.0–2.0)
Bicarbonate: 34.8 mmol/L — ABNORMAL HIGH (ref 20.0–28.0)
Bicarbonate: 35.2 mmol/L — ABNORMAL HIGH (ref 20.0–28.0)
Bicarbonate: 35.4 mmol/L — ABNORMAL HIGH (ref 20.0–28.0)
Bicarbonate: 35.6 mmol/L — ABNORMAL HIGH (ref 20.0–28.0)
Bicarbonate: 35.8 mmol/L — ABNORMAL HIGH (ref 20.0–28.0)
Bicarbonate: 36.5 mmol/L — ABNORMAL HIGH (ref 20.0–28.0)
Bicarbonate: 36.6 mmol/L — ABNORMAL HIGH (ref 20.0–28.0)
Bicarbonate: 36.9 mmol/L — ABNORMAL HIGH (ref 20.0–28.0)
Bicarbonate: 37.2 mmol/L — ABNORMAL HIGH (ref 20.0–28.0)
Bicarbonate: 37.7 mmol/L — ABNORMAL HIGH (ref 20.0–28.0)
Bicarbonate: 37.8 mmol/L — ABNORMAL HIGH (ref 20.0–28.0)
Bicarbonate: 37.9 mmol/L — ABNORMAL HIGH (ref 20.0–28.0)
Calcium, Ion: 1.23 mmol/L (ref 1.15–1.40)
Calcium, Ion: 1.25 mmol/L (ref 1.15–1.40)
Calcium, Ion: 1.26 mmol/L (ref 1.15–1.40)
Calcium, Ion: 1.28 mmol/L (ref 1.15–1.40)
Calcium, Ion: 1.28 mmol/L (ref 1.15–1.40)
Calcium, Ion: 1.29 mmol/L (ref 1.15–1.40)
Calcium, Ion: 1.29 mmol/L (ref 1.15–1.40)
Calcium, Ion: 1.29 mmol/L (ref 1.15–1.40)
Calcium, Ion: 1.29 mmol/L (ref 1.15–1.40)
Calcium, Ion: 1.3 mmol/L (ref 1.15–1.40)
Calcium, Ion: 1.35 mmol/L (ref 1.15–1.40)
Calcium, Ion: 1.35 mmol/L (ref 1.15–1.40)
HCT: 20 % — ABNORMAL LOW (ref 39.0–52.0)
HCT: 23 % — ABNORMAL LOW (ref 39.0–52.0)
HCT: 24 % — ABNORMAL LOW (ref 39.0–52.0)
HCT: 24 % — ABNORMAL LOW (ref 39.0–52.0)
HCT: 25 % — ABNORMAL LOW (ref 39.0–52.0)
HCT: 25 % — ABNORMAL LOW (ref 39.0–52.0)
HCT: 26 % — ABNORMAL LOW (ref 39.0–52.0)
HCT: 27 % — ABNORMAL LOW (ref 39.0–52.0)
HCT: 27 % — ABNORMAL LOW (ref 39.0–52.0)
HCT: 28 % — ABNORMAL LOW (ref 39.0–52.0)
HCT: 28 % — ABNORMAL LOW (ref 39.0–52.0)
HCT: 28 % — ABNORMAL LOW (ref 39.0–52.0)
Hemoglobin: 6.8 g/dL — CL (ref 13.0–17.0)
Hemoglobin: 7.8 g/dL — ABNORMAL LOW (ref 13.0–17.0)
Hemoglobin: 8.2 g/dL — ABNORMAL LOW (ref 13.0–17.0)
Hemoglobin: 8.2 g/dL — ABNORMAL LOW (ref 13.0–17.0)
Hemoglobin: 8.5 g/dL — ABNORMAL LOW (ref 13.0–17.0)
Hemoglobin: 8.5 g/dL — ABNORMAL LOW (ref 13.0–17.0)
Hemoglobin: 8.8 g/dL — ABNORMAL LOW (ref 13.0–17.0)
Hemoglobin: 9.2 g/dL — ABNORMAL LOW (ref 13.0–17.0)
Hemoglobin: 9.2 g/dL — ABNORMAL LOW (ref 13.0–17.0)
Hemoglobin: 9.5 g/dL — ABNORMAL LOW (ref 13.0–17.0)
Hemoglobin: 9.5 g/dL — ABNORMAL LOW (ref 13.0–17.0)
Hemoglobin: 9.5 g/dL — ABNORMAL LOW (ref 13.0–17.0)
O2 Saturation: 100 %
O2 Saturation: 60 %
O2 Saturation: 91 %
O2 Saturation: 94 %
O2 Saturation: 95 %
O2 Saturation: 95 %
O2 Saturation: 95 %
O2 Saturation: 96 %
O2 Saturation: 98 %
O2 Saturation: 98 %
O2 Saturation: 99 %
O2 Saturation: 99 %
Patient temperature: 37
Patient temperature: 37
Patient temperature: 37
Patient temperature: 37.1
Patient temperature: 37.1
Patient temperature: 37.2
Patient temperature: 97.5
Patient temperature: 97.79
Patient temperature: 98.1
Patient temperature: 98.1
Patient temperature: 98.8
Potassium: 4.2 mmol/L (ref 3.5–5.1)
Potassium: 4.5 mmol/L (ref 3.5–5.1)
Potassium: 4.6 mmol/L (ref 3.5–5.1)
Potassium: 4.6 mmol/L (ref 3.5–5.1)
Potassium: 4.6 mmol/L (ref 3.5–5.1)
Potassium: 4.7 mmol/L (ref 3.5–5.1)
Potassium: 4.8 mmol/L (ref 3.5–5.1)
Potassium: 4.8 mmol/L (ref 3.5–5.1)
Potassium: 4.9 mmol/L (ref 3.5–5.1)
Potassium: 4.9 mmol/L (ref 3.5–5.1)
Potassium: 4.9 mmol/L (ref 3.5–5.1)
Potassium: 5.4 mmol/L — ABNORMAL HIGH (ref 3.5–5.1)
Sodium: 142 mmol/L (ref 135–145)
Sodium: 144 mmol/L (ref 135–145)
Sodium: 145 mmol/L (ref 135–145)
Sodium: 145 mmol/L (ref 135–145)
Sodium: 146 mmol/L — ABNORMAL HIGH (ref 135–145)
Sodium: 146 mmol/L — ABNORMAL HIGH (ref 135–145)
Sodium: 146 mmol/L — ABNORMAL HIGH (ref 135–145)
Sodium: 146 mmol/L — ABNORMAL HIGH (ref 135–145)
Sodium: 146 mmol/L — ABNORMAL HIGH (ref 135–145)
Sodium: 147 mmol/L — ABNORMAL HIGH (ref 135–145)
Sodium: 147 mmol/L — ABNORMAL HIGH (ref 135–145)
Sodium: 147 mmol/L — ABNORMAL HIGH (ref 135–145)
TCO2: 36 mmol/L — ABNORMAL HIGH (ref 22–32)
TCO2: 37 mmol/L — ABNORMAL HIGH (ref 22–32)
TCO2: 37 mmol/L — ABNORMAL HIGH (ref 22–32)
TCO2: 37 mmol/L — ABNORMAL HIGH (ref 22–32)
TCO2: 38 mmol/L — ABNORMAL HIGH (ref 22–32)
TCO2: 38 mmol/L — ABNORMAL HIGH (ref 22–32)
TCO2: 39 mmol/L — ABNORMAL HIGH (ref 22–32)
TCO2: 39 mmol/L — ABNORMAL HIGH (ref 22–32)
TCO2: 39 mmol/L — ABNORMAL HIGH (ref 22–32)
TCO2: 39 mmol/L — ABNORMAL HIGH (ref 22–32)
TCO2: 40 mmol/L — ABNORMAL HIGH (ref 22–32)
TCO2: 40 mmol/L — ABNORMAL HIGH (ref 22–32)
pCO2 arterial: 55.3 mmHg — ABNORMAL HIGH (ref 32.0–48.0)
pCO2 arterial: 56.4 mmHg — ABNORMAL HIGH (ref 32.0–48.0)
pCO2 arterial: 57.3 mmHg — ABNORMAL HIGH (ref 32.0–48.0)
pCO2 arterial: 57.4 mmHg — ABNORMAL HIGH (ref 32.0–48.0)
pCO2 arterial: 57.6 mmHg — ABNORMAL HIGH (ref 32.0–48.0)
pCO2 arterial: 59.1 mmHg — ABNORMAL HIGH (ref 32.0–48.0)
pCO2 arterial: 59.2 mmHg — ABNORMAL HIGH (ref 32.0–48.0)
pCO2 arterial: 59.2 mmHg — ABNORMAL HIGH (ref 32.0–48.0)
pCO2 arterial: 65.4 mmHg (ref 32.0–48.0)
pCO2 arterial: 65.7 mmHg (ref 32.0–48.0)
pCO2 arterial: 68.7 mmHg (ref 32.0–48.0)
pCO2 arterial: 78.3 mmHg (ref 32.0–48.0)
pH, Arterial: 7.292 — ABNORMAL LOW (ref 7.350–7.450)
pH, Arterial: 7.334 — ABNORMAL LOW (ref 7.350–7.450)
pH, Arterial: 7.344 — ABNORMAL LOW (ref 7.350–7.450)
pH, Arterial: 7.361 (ref 7.350–7.450)
pH, Arterial: 7.389 (ref 7.350–7.450)
pH, Arterial: 7.395 (ref 7.350–7.450)
pH, Arterial: 7.399 (ref 7.350–7.450)
pH, Arterial: 7.402 (ref 7.350–7.450)
pH, Arterial: 7.406 (ref 7.350–7.450)
pH, Arterial: 7.412 (ref 7.350–7.450)
pH, Arterial: 7.413 (ref 7.350–7.450)
pH, Arterial: 7.429 (ref 7.350–7.450)
pO2, Arterial: 107 mmHg (ref 83.0–108.0)
pO2, Arterial: 118 mmHg — ABNORMAL HIGH (ref 83.0–108.0)
pO2, Arterial: 125 mmHg — ABNORMAL HIGH (ref 83.0–108.0)
pO2, Arterial: 33 mmHg — CL (ref 83.0–108.0)
pO2, Arterial: 330 mmHg — ABNORMAL HIGH (ref 83.0–108.0)
pO2, Arterial: 64 mmHg — ABNORMAL LOW (ref 83.0–108.0)
pO2, Arterial: 73 mmHg — ABNORMAL LOW (ref 83.0–108.0)
pO2, Arterial: 76 mmHg — ABNORMAL LOW (ref 83.0–108.0)
pO2, Arterial: 80 mmHg — ABNORMAL LOW (ref 83.0–108.0)
pO2, Arterial: 82 mmHg — ABNORMAL LOW (ref 83.0–108.0)
pO2, Arterial: 93 mmHg (ref 83.0–108.0)
pO2, Arterial: 99 mmHg (ref 83.0–108.0)

## 2019-10-08 LAB — COOXEMETRY PANEL
Carboxyhemoglobin: 3 % — ABNORMAL HIGH (ref 0.5–1.5)
Methemoglobin: 0.6 % (ref 0.0–1.5)
O2 Saturation: 79.7 %
Total hemoglobin: 7.6 g/dL — ABNORMAL LOW (ref 12.0–16.0)

## 2019-10-08 LAB — CBC
HCT: 26.4 % — ABNORMAL LOW (ref 39.0–52.0)
HCT: 29.8 % — ABNORMAL LOW (ref 39.0–52.0)
Hemoglobin: 7.2 g/dL — ABNORMAL LOW (ref 13.0–17.0)
Hemoglobin: 8.8 g/dL — ABNORMAL LOW (ref 13.0–17.0)
MCH: 27.1 pg (ref 26.0–34.0)
MCH: 28.3 pg (ref 26.0–34.0)
MCHC: 27.3 g/dL — ABNORMAL LOW (ref 30.0–36.0)
MCHC: 29.5 g/dL — ABNORMAL LOW (ref 30.0–36.0)
MCV: 99.2 fL (ref 80.0–100.0)
MCV: 95.8 fL (ref 80.0–100.0)
Platelets: 108 10*3/uL — ABNORMAL LOW (ref 150–400)
Platelets: 106 10*3/uL — ABNORMAL LOW (ref 150–400)
RBC: 2.66 MIL/uL — ABNORMAL LOW (ref 4.22–5.81)
RBC: 3.11 MIL/uL — ABNORMAL LOW (ref 4.22–5.81)
RDW: 18.4 % — ABNORMAL HIGH (ref 11.5–15.5)
RDW: 18.1 % — ABNORMAL HIGH (ref 11.5–15.5)
WBC: 8.5 10*3/uL (ref 4.0–10.5)
WBC: 9.5 10*3/uL (ref 4.0–10.5)
nRBC: 0.2 % (ref 0.0–0.2)
nRBC: 0.2 % (ref 0.0–0.2)

## 2019-10-08 LAB — GLUCOSE, CAPILLARY
Glucose-Capillary: 101 mg/dL — ABNORMAL HIGH (ref 70–99)
Glucose-Capillary: 102 mg/dL — ABNORMAL HIGH (ref 70–99)
Glucose-Capillary: 119 mg/dL — ABNORMAL HIGH (ref 70–99)
Glucose-Capillary: 72 mg/dL (ref 70–99)
Glucose-Capillary: 77 mg/dL (ref 70–99)
Glucose-Capillary: 82 mg/dL (ref 70–99)
Glucose-Capillary: 98 mg/dL (ref 70–99)

## 2019-10-08 LAB — HEPARIN LEVEL (UNFRACTIONATED)
Heparin Unfractionated: 1.74 IU/mL — ABNORMAL HIGH (ref 0.30–0.70)
Heparin Unfractionated: 0.27 IU/mL — ABNORMAL LOW (ref 0.30–0.70)
Heparin Unfractionated: 0.22 IU/mL — ABNORMAL LOW (ref 0.30–0.70)

## 2019-10-08 LAB — BASIC METABOLIC PANEL
Anion gap: 8 (ref 5–15)
BUN: 66 mg/dL — ABNORMAL HIGH (ref 6–20)
CO2: 33 mmol/L — ABNORMAL HIGH (ref 22–32)
Calcium: 8.5 mg/dL — ABNORMAL LOW (ref 8.9–10.3)
Chloride: 105 mmol/L (ref 98–111)
Creatinine, Ser: 0.57 mg/dL — ABNORMAL LOW (ref 0.61–1.24)
GFR calc Af Amer: >60 mL/min (ref >60)
GFR calc non Af Amer: >60 mL/min (ref >60)
Glucose, Bld: 87 mg/dL (ref 70–99)
Potassium: 4.9 mmol/L (ref 3.5–5.1)
Sodium: 146 mmol/L — ABNORMAL HIGH (ref 135–145)

## 2019-10-08 LAB — LACTIC ACID, PLASMA: Lactic Acid, Venous: 0.9 mmol/L (ref 0.5–1.9)

## 2019-10-08 LAB — FIBRINOGEN: Fibrinogen: 340 mg/dL (ref 210–475)

## 2019-10-08 LAB — PREPARE RBC (CROSSMATCH)

## 2019-10-08 LAB — LACTATE DEHYDROGENASE: LDH: 404 U/L — ABNORMAL HIGH (ref 98–192)

## 2019-10-08 LAB — APTT
aPTT: >200 seconds (ref 24–36)
aPTT: 95 seconds — ABNORMAL HIGH (ref 24–36)
aPTT: 79 seconds — ABNORMAL HIGH (ref 24–36)

## 2019-10-08 LAB — MAGNESIUM: Magnesium: 2.2 mg/dL (ref 1.7–2.4)

## 2019-10-08 LAB — PHOSPHORUS: Phosphorus: 3.6 mg/dL (ref 2.5–4.6)

## 2019-10-08 SURGERY — CANCELLED PROCEDURE
Anesthesia: General

## 2019-10-08 MED ORDER — HEPARIN (PORCINE) 25000 UT/250ML-% IV SOLN
INTRAVENOUS | Status: DC | PRN
  Administered 2019-10-08: 1650 [IU]/h via INTRAVENOUS

## 2019-10-08 MED ORDER — FENTANYL CITRATE (PF) 250 MCG/5ML IJ SOLN
INTRAMUSCULAR | Status: AC
  Filled 2019-10-08: qty 5

## 2019-10-08 MED ORDER — ROCURONIUM BROMIDE 10 MG/ML (PF) SYRINGE
PREFILLED_SYRINGE | INTRAVENOUS | Status: DC | PRN
  Administered 2019-10-08: 40 mg via INTRAVENOUS
  Administered 2019-10-08: 20 mg via INTRAVENOUS
  Administered 2019-10-08: 60 mg via INTRAVENOUS

## 2019-10-08 MED ORDER — PROPOFOL 10 MG/ML IV BOLUS
INTRAVENOUS | Status: AC
  Filled 2019-10-08: qty 20

## 2019-10-08 MED ORDER — MIDAZOLAM HCL 2 MG/2ML IJ SOLN
INTRAMUSCULAR | Status: AC
  Filled 2019-10-08: qty 2

## 2019-10-08 MED ORDER — PROPOFOL 10 MG/ML IV BOLUS
INTRAVENOUS | Status: DC | PRN
  Administered 2019-10-08: 80 mg via INTRAVENOUS

## 2019-10-08 MED ORDER — LACTATED RINGERS IV SOLN: INTRAVENOUS | Status: DC | PRN

## 2019-10-08 MED ORDER — SODIUM CHLORIDE 0.9% IV SOLUTION: Freq: Once | INTRAVENOUS | Status: DC

## 2019-10-08 MED ORDER — FUROSEMIDE 10 MG/ML IJ SOLN
40.0000 mg | Freq: Once | INTRAMUSCULAR | Status: AC
  Administered 2019-10-08: 40 mg via INTRAVENOUS
  Filled 2019-10-08: qty 4

## 2019-10-08 MED ORDER — MIDAZOLAM HCL 5 MG/5ML IJ SOLN
INTRAMUSCULAR | Status: DC | PRN
  Administered 2019-10-08: 2 mg via INTRAVENOUS

## 2019-10-08 MED ORDER — 0.9 % SODIUM CHLORIDE (POUR BTL) OPTIME
TOPICAL | Status: DC | PRN
  Administered 2019-10-08: 07:00:00 1000 mL

## 2019-10-08 MED ORDER — PROPOFOL 500 MG/50ML IV EMUL
INTRAVENOUS | Status: DC | PRN
  Administered 2019-10-08: 100 ug/kg/min via INTRAVENOUS

## 2019-10-08 MED ORDER — HEPARIN (PORCINE) 25000 UT/250ML-% IV SOLN
1750.0000 [IU]/h | INTRAVENOUS | Status: DC
  Administered 2019-10-09: 1750 [IU]/h via INTRAVENOUS
  Filled 2019-10-08 (×3): qty 250

## 2019-10-08 MED ORDER — VASOPRESSIN 20 UNIT/ML IV SOLN
INTRAVENOUS | Status: AC
  Filled 2019-10-08: qty 1

## 2019-10-08 SURGICAL SUPPLY — 47 items
ANCHOR CATH FOLEY SECURE (MISCELLANEOUS) ×22 IMPLANT
APPLIER CLIP 13 LRG OPEN (CLIP)
APR CLP LRG 13 20 CLIP (CLIP)
BLADE CLIPPER SURG (BLADE) ×4 IMPLANT
CANNULA FEMORAL ART 14 SM (MISCELLANEOUS) IMPLANT
CLIP APPLIE 13 LRG OPEN (CLIP) IMPLANT
CONN 3/8X3/8 STRAIGHT W/LL (MISCELLANEOUS) ×4 IMPLANT
COVER PROBE W GEL 5X96 (DRAPES) ×4 IMPLANT
COVER SURGICAL LIGHT HANDLE (MISCELLANEOUS) ×7 IMPLANT
DRAPE C-ARM 42X120 X-RAY (DRAPES) ×3 IMPLANT
DRAPE C-ARM 42X72 X-RAY (DRAPES) ×4 IMPLANT
DRAPE CARDIOVASCULAR INCISE (DRAPES) ×3
DRAPE SRG 135X102X78XABS (DRAPES) ×2 IMPLANT
DRSG TEGADERM 4X4.5 CHG (GAUZE/BANDAGES/DRESSINGS) ×3 IMPLANT
ELECT REM PT RETURN 9FT ADLT (ELECTROSURGICAL) ×3
ELECTRODE REM PT RTRN 9FT ADLT (ELECTROSURGICAL) ×2 IMPLANT
FELT TEFLON 1X6 (MISCELLANEOUS) IMPLANT
FEMORAL VENOUS CANN RAP (CANNULA) IMPLANT
GAUZE SPONGE 4X4 12PLY STRL (GAUZE/BANDAGES/DRESSINGS) ×3 IMPLANT
GLOVE BIO SURGEON STRL SZ 6.5 (GLOVE) ×2 IMPLANT
GLOVE BIO SURGEONS STRL SZ 6.5 (GLOVE) ×1
GLOVE BIOGEL PI IND STRL 6.5 (GLOVE) ×3 IMPLANT
GLOVE BIOGEL PI INDICATOR 6.5 (GLOVE) ×6
GLOVE ECLIPSE 8.0 STRL XLNG CF (GLOVE) ×3 IMPLANT
GLOVE NEODERM STRL 7.5 LF PF (GLOVE) ×3 IMPLANT
GLOVE SURG NEODERM 7.5  LF PF (GLOVE) ×4
GOWN STRL REUS W/ TWL LRG LVL3 (GOWN DISPOSABLE) ×8 IMPLANT
GOWN STRL REUS W/TWL LRG LVL3 (GOWN DISPOSABLE) ×12
KIT BASIN OR (CUSTOM PROCEDURE TRAY) ×3 IMPLANT
NS IRRIG 1000ML POUR BTL (IV SOLUTION) ×8 IMPLANT
PACK GENERAL/GYN (CUSTOM PROCEDURE TRAY) ×4 IMPLANT
PACK UNIVERSAL I (CUSTOM PROCEDURE TRAY) ×3 IMPLANT
PAD ARMBOARD 7.5X6 YLW CONV (MISCELLANEOUS) ×4 IMPLANT
SCRUB POVIDONE IODINE 4 OZ (MISCELLANEOUS) ×4 IMPLANT
SET CANNULATION TOURNIQUET (MISCELLANEOUS) IMPLANT
SET PRESSURE INFUSION 24 (MISCELLANEOUS) ×4 IMPLANT
SHEATH AVANTI 11CM 5FR (SHEATH) ×4 IMPLANT
SOL PREP POV-IOD 4OZ 10% (MISCELLANEOUS) ×4 IMPLANT
SUT PROLENE 6 0 C 1 30 (SUTURE) IMPLANT
SUT SILK  1 MH (SUTURE) ×16
SUT SILK 1 MH (SUTURE) ×14 IMPLANT
SYR 30ML LL (SYRINGE) ×4 IMPLANT
TAPE CLOTH SURG 4X10 WHT LF (GAUZE/BANDAGES/DRESSINGS) ×3 IMPLANT
TOWEL GREEN STERILE (TOWEL DISPOSABLE) ×4 IMPLANT
TOWEL GREEN STERILE FF (TOWEL DISPOSABLE) ×4 IMPLANT
WATER STERILE IRR 1000ML POUR (IV SOLUTION) ×8 IMPLANT
WIRE J 3MM .035X145CM (WIRE) ×4 IMPLANT

## 2019-10-08 NOTE — Anesthesia Postprocedure Evaluation (Signed)
Anesthesia Post Note  Patient: [name]  Procedure(s) Performed: Cancelled Procedure - Patient not physiologically ready (N/A )     Patient location during evaluation: SICU Anesthesia Type: General Level of consciousness: sedated Pain management: pain level controlled Vital Signs Assessment: post-procedure vital signs reviewed and stable Respiratory status: patient remains intubated per anesthesia plan Cardiovascular status: stable Postop Assessment: no apparent nausea or vomiting Anesthetic complications: no Comments: ECMO restarted intra-operatively.  Decannulation procedure cancelled.     Last Vitals:  Vitals:   10/08/19 1100 10/08/19 1115  BP:    Pulse: 66 68  Resp: 13   Temp:    SpO2: 100% 100%    Last Pain:  Vitals:   10/08/19 0959  TempSrc: Axillary  PainSc:                  Catalina Gravel

## 2019-10-08 NOTE — H&P (Signed)
History and Physical Interval Note:  10/08/2019 7:23 AM  [name]  has presented today for surgery, with the diagnosis of respiratory failure.  The various methods of treatment have been discussed with the patient and family. After consideration of risks, benefits and other options for treatment, the patient has consented to  PROCEDURE: VENO-VENOUS ECMO DECANNULATION as a surgical intervention.  The patient's history has been reviewed, patient examined, no change in status, stable for surgery.  I have reviewed the patient's chart and labs.  Questions were answered to the patient's family's satisfaction.     Wonda Olds

## 2019-10-08 NOTE — Transfer of Care (Signed)
Immediate Anesthesia Transfer of Care Note  Patient: [name]  Procedure(s) Performed: Cancelled Procedure - Patient not physiologically ready (N/A )  Patient Location: ICU  Anesthesia Type:General  Level of Consciousness: Patient remains intubated per anesthesia plan  Airway & Oxygen Therapy: Patient remains intubated per anesthesia plan and Patient placed on Ventilator (see vital sign flow sheet for setting)  Post-op Assessment: Report given to RN and Post -op Vital signs reviewed and stable  Post vital signs: Reviewed and stable  Last Vitals:  Vitals Value Taken Time  BP    Temp    Pulse 81 10/08/19 0917  Resp 0 10/08/19 0917  SpO2 100 % 10/08/19 0917  Vitals shown include unvalidated device data.  Last Pain:  Vitals:   10/08/19 3086  TempSrc: Oral  PainSc:       Patients Stated Pain Goal: 0 (57/84/69 6295)  Complications: No apparent anesthesia complications

## 2019-10-08 NOTE — Progress Notes (Signed)
NAME:  [name], MRN:  [id number], DOB:  01/28/1976, LOS: 78 ADMISSION DATE:  07/22/2019, CONSULTATION DATE:  12/24 REFERRING MD:  Sloan Leiter, CHIEF COMPLAINT:  Dyspnea   Brief History   44 y/o male admitted 12/14 with COVID pneumonia leading to ARDS, pneumomediastinum.  Intubated 12/26, bilateral chest tubes placed.  Past Medical History  GERD HTN Asthma  Significant Hospital Events   12/14 admit with hypoxemic respiratory failure in setting of COVID-19 pneumonia 12/23 noted to have pneumomediastinum on chest x-ray 12/24PCCM consulted for evaluation 12/26 worsening hypoxemia, intubated 12/27 VV fem/fem ECMO cannulation 12/29 tracheostomy >> See bedside nursing event notes for full rundown of procedures after 12/27 1/5 epistaxis, nasal packing 1/30 improved cxr, improved lung compliance, TV 350s on 15PC  1/31 CXR increased infiltrates, +CFB     2/2 Converted to right IJ Crescent cannula. 2/3 PEG tube inserted at bedside 2/14 30 min sweep trial> paO2 105 (18 PEEP, 60% FiO2) pH 7.22, pCO2 87 2/20 2 hours sweep trial 2/21 1.5 hour sweep trial 2/24 7-hour sweep trial.  Increasingly awake. 3/2 - decannulation attempt aborted due to increased hypercapnia when sedated for procedure  Consults:  PCCM, CTS, CHF, Palliative  Procedures:  PICC 1/3 >> R radial a-line 1/20 >> R IJ ECMO cath 2/3 >> Tracheostomy 12/29 >>  Significant Diagnostic Tests:  CT chest 12/22>>extensive pneumomediastinum, multifocal patchy bilateral groundglass opacities CXR 1/25 >> improving bilateral infiltrates. ECMO cannulae in place. CXR 2/5 >> clearing bilateral infiltrates  Micro Data:  BCx2 12/14 >>negative  Sputum 12/15 >> OPF 12/28 blood > negative 12/29 > staph aureus 1/4 bronch:  1/2 acid fast smear bronch: negative 1/2 fungal cx bronch: negative1/12 BAL: serratia, staph epidermidisu 1/4 BAL > MSSA, Serratia 1/9 blood > negative 1/9 Resp culture > MSSA, serratia 1/11 serratia >  negative 1/12 fungus culture > negative 1/12 BAL > serratia 1/18 BAL >> rare Serratia. 1/24 blood > negative 1/30 blood > negative 1/30 resp culture > serratia 2/1 BAL >> rare Serratia - enterococcus 2/1 aspergillus Ag > negative 2/2 resp culture > serratia  2/2 blood > negative 2/5 urine > negative 2/9 resp: serratia 2/15 BAL serratia  Antimicrobials:  Received remdesivir, steroids, actemra and convalescent plasma  5 days cefepime 12/24-12/29 Cefepime 1/4->1/13 vanc 1/4> 1/11 Ceftriaxone 1/14 > 1/27 vanc x1 1/20 Cefazolin 1/29 > 2/2 cefuroxim 2/2 vanc 2/2 Ceftriaxone x1 2/4 Meropenem 2/15>>2/21 Tobramycin inhaled 2/15>>2/21  Interim history/subjective:  Wants to get our of bed and go home. Bothered by neck cannula.  Aborted attempt at decannulation due to marked increase in hypercapnia with sedation  Objective   Blood pressure (!) 161/67, pulse 74, temperature 98.6 F (37 C), temperature source Core, resp. rate 19, height _0  (1.727 m), weight 133 kg, SpO2 100 %. CVP:  [1 mmHg-15 mmHg] 11 mmHg  Vent Mode: PCV FiO2 (%):  [40 %] 40 % Set Rate:  [20 bmp] 20 bmp PEEP:  [10 cmH20] 10 cmH20 Plateau Pressure:  [20 cmH20-31 cmH20] 20 cmH20   Intake/Output Summary (Last 24 hours) at 10/08/2019 1307 Last data filed at 10/08/2019 1200 Gross per 24 hour  Intake 4249.74 ml  Output 2595 ml  Net 1654.74 ml   Filed Weights   10/06/19 0500 10/07/19 0500 10/08/19 0500  Weight: 134.8 kg 133 kg 133 kg    Examination: GEN: ill appearing man on vent HEENT: RIJ catheter CDI CV: RRR, ext warm PULM: Coarse bilaterally, shallow GI: Soft, PEG CDI EXT: Anasarca still present NEURO: follows  commands more briskly today,strength improving as patient moving thighs PSYCH: RASS 0  SKIN: Pale  Resolved Hospital Problem list   Pneumothoraces-resolved.  Assessment & Plan:   ARDS due to COVID 19, requiring mechanical ventilation and VV ECMO  - Improving, lingering issues with HCAP  vs. Bronchitis and profound deconditioning. - Continue daily sweep trials - For potential decannulation 3/1  AKI with azotemia - improving  Positive cumulative fluid balance Hypernatremia has corrected Severe third spacing - Will diurese today   Hypertension - clonidine, amlodipine and propanolol and hydralazine;  Serratia pneumonia> treated Stop date 2/22  Neuro muscular weakness Appreciate PT input.  For range of motion exercises.  Improving. - Trial of testosterone per previous discussions  Bilateral lower feet deep tissue injury Wound care following - improving.  Best practice:  Pain/Anxiety/Delirium protocol (if indicated): PRNs only VAP protocol (if indicated): Bundle in place. Respiratory support goals: Continue current ultra lung protective strategy.  DVT prophylaxis: Heparin drip, goal 0.3-0.5 GI prophylaxis: PPI Nutrition: TF Fluid status goals:  See above Urinary catheter: Guide hemodynamic management Glucose control: controlled with current regimen Mobility/therapy needs: Bedrest.  Daily labs: as per ECMO protocol. Code Status: Full code  Disposition: ICU  Labs   CBC: CBC Latest Ref Rng & Units 10/08/2019 10/08/2019 10/08/2019  WBC 4.0 - 10.5 K/uL - - -  Hemoglobin 13.0 - 17.0 g/dL 8.8(L) 9.5(L) 9.5(L)  Hematocrit 39.0 - 52.0 % 26.0(L) 28.0(L) 28.0(L)  Platelets 150 - 400 K/uL - - -     Basic Metabolic Panel: BMP Latest Ref Rng & Units 10/08/2019 10/08/2019 10/08/2019  Glucose 70 - 99 mg/dL - - -  BUN 6 - 20 mg/dL - - -  Creatinine 0.61 - 1.24 mg/dL - - -  Sodium 135 - 145 mmol/L 146(H) 147(H) 146(H)  Potassium 3.5 - 5.1 mmol/L 4.8 4.9 4.9  Chloride 98 - 111 mmol/L - - -  CO2 22 - 32 mmol/L - - -  Calcium 8.9 - 10.3 mg/dL - - -     GFR: Estimated Creatinine Clearance: 155.4 mL/min (A) (by C-G formula based on SCr of 0.57 mg/dL (L)). Recent Labs  Lab 10/05/19 0355 10/05/19 1600 10/06/19 0353 10/06/19 0353 10/06/19 1606 10/07/19 0312 10/07/19 0317  10/07/19 1610 10/08/19 0357  WBC 10.8*   < > 11.2*   < > 13.8* 8.4  --  12.5* 8.5  LATICACIDVEN 1.1  --  1.2  --   --   --  0.7  --  0.9   < > = values in this interval not displayed.    Liver Function Tests: Recent Labs  Lab 10/06/19 0353 10/07/19 0317  ALBUMIN 1.9* 1.9*   No results for input(s): LIPASE, AMYLASE in the last 168 hours. No results for input(s): AMMONIA in the last 168 hours.  ABG    Component Value Date/Time   PHART 7.406 10/08/2019 1138   PCO2ART 56.4 (H) 10/08/2019 1138   PO2ART 80.0 (L) 10/08/2019 1138   HCO3 35.4 (H) 10/08/2019 1138   TCO2 37 (H) 10/08/2019 1138   ACIDBASEDEF 4.0 (H) 09/16/2019 2334   O2SAT 95.0 10/08/2019 1138     Coagulation Profile: No results for input(s): INR, PROTIME in the last 168 hours.  Cardiac Enzymes: No results for input(s): CKTOTAL, CKMB, CKMBINDEX, TROPONINI in the last 168 hours.  HbA1C: Hgb A1c MFr Bld  Date/Time Value Ref Range Status  09/23/2019 08:58 AM 5.5 4.8 - 5.6 % Final    Comment:    (NOTE) Pre  diabetes:          5.7%-6.4% Diabetes:              >6.4% Glycemic control for   <7.0% adults with diabetes   07/24/2019 04:50 AM 6.7 (H) 4.8 - 5.6 % Final    Comment:    (NOTE) Pre diabetes:          5.7%-6.4% Diabetes:              >6.4% Glycemic control for   <7.0% adults with diabetes     CBG: Recent Labs  Lab 10/07/19 2134 10/07/19 2358 10/08/19 0409 10/08/19 0945 10/08/19 1136  GLUCAP 148* 72 82 77 98   This patient is critically ill with multiple organ system failure; which, requires frequent high complexity decision making, assessment, support, evaluation, and titration of therapies. This was completed through the application of advanced monitoring technologies and extensive interpretation of multiple databases. During this encounter critical care time was devoted to patient care services described in this note for 35 minutes.  Kipp Brood, MD [facility] 10/08/2019  1:07 PM

## 2019-10-08 NOTE — Progress Notes (Signed)
Patient was transported from 2H04 to OR14 on battery power.  Transport was uneventful.  An ABG was drawn with suboptimal results and the sweep gas was turned on at 0816 per Dr. Orvan Seen.  Patient was transported from the OR to 2H04 on battery power and oxygen tank.  Upon arrival to the unit, the ECMO circuit was connected to a red power outlet and wall oxygen source.Transport was uneventful. Handoff was given to the ECMO Specialist, Robbie Lis.

## 2019-10-08 NOTE — Progress Notes (Signed)
ANTICOAGULATION CONSULT NOTE - Follow Up Consult  Pharmacy Consult for Heparin Indication: ECMO  Labs: Recent Labs    10/06/19 0353 10/07/19 0312 10/07/19 0317 10/07/19 0406 10/07/19 1610 10/07/19 1616 10/07/19 1621 10/07/19 1935 10/08/19 0357 10/08/19 0421 10/08/19 0537 10/08/19 2103 10/08/19 0725 10/08/19 0826 10/08/19 0826 10/08/19 0922 10/08/19 1033  HGB  --  7.4*  --    < > 7.6*   < > 8.5*   < > 7.2*   < >  --   --    < > 9.5*   < > 9.5* 9.5*  HCT  --  26.8*  --    < > 27.5*   < > 25.0*   < > 26.4*   < >  --   --    < > 28.0*  --  28.0* 28.0*  PLT  --  104*  --   --  125*  --   --   --  108*  --   --   --   --   --   --   --   --   APTT  --  93*  --    < > 79*  --   --   --  >200*  --  95*  --   --   --   --   --   --   HEPARINUNFRC  --  0.38  --    < > 0.29*  --   --   --  1.74*  --   --  0.27*  --   --   --   --   --   CREATININE   < >  --  0.64  --   --   --  0.54*  --  0.57*  --   --   --   --   --   --   --   --    < > = values in this interval not displayed.    Assessment: 44yo male on ECMO.  Heparin level therapeutic at 0.38, on 1650 units/hr. Hgb 9.5, Plt stable ~ 100 (trending up from earlier slightly). No s/sx of bleeding or infusion issues per RN.  Transfuse when Hgb < 7. LDH and fibrinogen stable.   Goal of Therapy:  Heparin level 0.3-0.4 units/ml   Plan:  Resume IV heparin at 1650 units/hr. Heparin levels and CBC with routine ECMO labs. Monitor for bleeding  [name], [facility] Clinical Pharmacist Phone [phone]  10/08/2019 11:26 AM

## 2019-10-08 NOTE — Anesthesia Procedure Notes (Signed)
Date/Time: 10/08/2019 7:54 AM Performed by: Colin Benton, CRNA Pre-anesthesia Checklist: Patient identified, Emergency Drugs available, Suction available and Patient being monitored Patient Re-evaluated:Patient Re-evaluated prior to induction Oxygen Delivery Method: Circle system utilized Preoxygenation: Pre-oxygenation with 100% oxygen Induction Type: IV induction Airway Equipment and Method: Tracheostomy Placement Confirmation: positive ETCO2 and breath sounds checked- equal and bilateral Comments: Patient with existing cuffed trach in place.  Remains on ICU ventilator for procedure.

## 2019-10-08 NOTE — Progress Notes (Signed)
Patient transported to OR with bedside RN, RT, anesthesia and perfusion.  Report given to CRNA and verified they were aware of new unit of PRBCs hung at 0720.

## 2019-10-08 NOTE — CV Procedure (Signed)
   ECMO NOTE:  Indication: Respiratory failure due to COVID PNA  Initial cannulation date: 08/03/2019  ECMO type: VV ECMO (Cardio-Help)  Dual lumen  Inflow/return cannula:  1) 32 FR Crescent placed 2/2  ECMO events:  Initial cannulation 12/26 Circuit changed and trach exchanged on 1/8.   Underwent addition of a RIJ drainage cannula on 1/20 Circuit changed on 1/22 for rising LDH Had evidence of recirculation and RIJ cannula pulled back on 1/26 19 FR RIJ removed 09/06/19 Underwent change to [facility] catheter with removal of femoral lines 2/2 2/21 2 hour sweep trial  2/22 Sedation weaned off  2/24 7 hour sweep trial  2/25 3 hours of sweep trial 2/26  2.5 hours of sweep trial  2/27 1 hour sweep trial 3/1  Off sweep x 20 hours 3/2  Aborted ECMO cannula removal   Daily data:   Flow 4.825 RPM 3585 dP 46 Pven  -70 Sweep  0  SVO2 71.3%  Labs:  7.34/66/93/96% Hgb 7.2 LDH 378 -> 383 -> 385 -> 379 -> 404 Heparin level: 0.38 Lactic acid 0.9 BUN 66  Plan:  Failed decannulation today due to oversedation and paralytics in OR Reattempt in am. Potentially at bedside   Glori Bickers, MD  8:34 AM

## 2019-10-08 NOTE — Progress Notes (Signed)
Patient ID: [name], male   DOB: 20-Nov-1975, 44 y.o.   MRN: [id number]    Advanced Heart Failure Rounding Note   Subjective:    Has been on Sweep trial for almost 48 hours this am.   Awake following commands. Seems eager to get cannula out of neck.   Taken to OR but failed decannulation due to oversedation and paralytics. Had to be put back on ECMO and sweep turned back on.   CXR stable today. Personally reviewed   Labs:  7.34/66/93/96% Hgb 7.2 LDH 378 -> 383 -> 385 -> 379 -> 404 Heparin level: 0.38 Lactic acid 0.9 BUN 66   ECMO parameters - see separate ecmo note   Objective:   Weight Range:  Vital Signs:   Temp:  [97.5 F (36.4 C)-99.2 F (37.3 C)] 98.8 F (37.1 C) (03/02 1600) Pulse Rate:  [62-92] 82 (03/02 1700) Resp:  [0-36] 17 (03/02 1700) BP: (132-161)/(54-69) 161/67 (03/02 0612) SpO2:  [93 %-100 %] 100 % (03/02 1700) Arterial Line BP: (113-179)/(47-82) 153/67 (03/02 1700) FiO2 (%):  [40 %] 40 % (03/02 1507) Weight:  [836 kg] 133 kg (03/02 0500) Last BM Date: 10/07/19  Weight change: Filed Weights   10/06/19 0500 10/07/19 0500 10/08/19 0500  Weight: 134.8 kg 133 kg 133 kg    Intake/Output:   Intake/Output Summary (Last 24 hours) at 10/08/2019 1822 Last data filed at 10/08/2019 1700 Gross per 24 hour  Intake 3575.21 ml  Output 2875 ml  Net 700.21 ml     Physical Exam (prio to going to OR): General:  Awake on vent/ECMO. Following commands but unable to move arms much. moving legs better HEENT: normal Neck: supple. RIJ cannula + trach Carotids 2+ bilat; no bruits. No lymphadenopathy or thryomegaly appreciated. Cor: PMI nondisplaced. Regular rate & rhythm. No rubs, gallops or murmurs. Lungs: clear  Abdomen: obese soft, nontender, nondistended. No hepatosplenomegaly. No bruits or masses. Good bowel sounds. + PEG Extremities: no cyanosis, clubbing, rash, edema + feet DTIs Neuro: awake on ecmo. Following commands weak.     Telemetry:  Sinus 80-90s Personally reviewed   Labs: Basic Metabolic Panel: Recent Labs  Lab 10/04/19 0340 10/04/19 0517 10/05/19 0355 10/05/19 0419 10/06/19 0353 10/06/19 0353 10/06/19 0407 10/07/19 0312 10/07/19 0317 10/07/19 0406 10/07/19 1621 10/07/19 1935 10/08/19 0357 10/08/19 0421 10/08/19 0922 10/08/19 1033 10/08/19 1138 10/08/19 1625 10/08/19 1750  NA 143   < > 143   < > 144   < >   < >  --  147*   < > 145  145   < > 146*   < > 146* 147* 146* 146* 145  K 4.9   < > 4.6   < > 4.5   < >   < >  --  4.7   < > 4.7  4.9   < > 4.9   < > 4.9 4.9 4.8 4.6 4.2  CL 106   < > 109  --  105  --   --   --  106  --  107  --  105  --   --   --   --   --   --   CO2 29   < > 30  --  32  --   --   --  34*  --  34*  --  33*  --   --   --   --   --   --   GLUCOSE  123*   < > 69*  --  74  --   --   --  74  --  75  --  87  --   --   --   --   --   --   BUN 82*   < > 76*  --  70*  --   --   --  68*  --  69*  --  66*  --   --   --   --   --   --   CREATININE 0.68   < > 0.84  --  0.63  --   --   --  0.64  --  0.54*  --  0.57*  --   --   --   --   --   --   CALCIUM 8.5*   < > 8.5*   < > 8.7*   < >  --   --  8.9  --  8.3*  --  8.5*  --   --   --   --   --   --   MG 2.2  --  2.1  --  2.0  --   --  1.9  --   --   --   --  2.2  --   --   --   --   --   --   PHOS 3.6   < > 3.5  --  3.0  3.3  --   --  3.6 3.6  --   --   --  3.6  --   --   --   --   --   --    < > = values in this interval not displayed.    Liver Function Tests: Recent Labs  Lab 10/06/19 0353 10/07/19 0317  ALBUMIN 1.9* 1.9*   No results for input(s): LIPASE, AMYLASE in the last 168 hours. No results for input(s): AMMONIA in the last 168 hours.  CBC: Recent Labs  Lab 10/06/19 1606 10/06/19 1656 10/07/19 0312 10/07/19 0406 10/07/19 1610 10/07/19 1616 10/08/19 0357 10/08/19 0421 10/08/19 1033 10/08/19 1138 10/08/19 1625 10/08/19 1700 10/08/19 1750  WBC 13.8*  --  8.4  --  12.5*  --  8.5  --   --   --   --  9.5  --   HGB  8.1*   < > 7.4*   < > 7.6*   < > 7.2*   < > 9.5* 8.8* 9.2* 8.8* 8.2*  HCT 28.8*   < > 26.8*   < > 27.5*   < > 26.4*   < > 28.0* 26.0* 27.0* 29.8* 24.0*  MCV 97.6  --  97.5  --  99.3  --  99.2  --   --   --   --  95.8  --   PLT 125*  --  104*  --  125*  --  108*  --   --   --   --  106*  --    < > = values in this interval not displayed.    Cardiac Enzymes: No results for input(s): CKTOTAL, CKMB, CKMBINDEX, TROPONINI in the last 168 hours.  BNP: BNP (last 3 results) Recent Labs    07/22/19 1039  BNP 15.3    ProBNP (last 3 results) No results for input(s): PROBNP in the last 8760 hours.    Other results:  Imaging: DG Chest 1 View  Result Date: 10/08/2019  CLINICAL DATA:  Tracheostomy, COVID pneumonia EXAM: CHEST  1 VIEW COMPARISON:  October 07, 2019 FINDINGS: The heart size and mediastinal contours are unchanged. Tracheostomy tube seen with the tip at the clavicular heads. Slight interval improvement in the multifocal patchy airspace opacities throughout both lungs. No pleural effusion. No acute osseous abnormality. IMPRESSION: Slight interval improvement in the multifocal airspace opacities. Electronically Signed   By: Prudencio Pair M.D.   On: 10/08/2019 06:46   DG CHEST PORT 1 VIEW  Result Date: 10/07/2019 CLINICAL DATA:  Tracheostomy, COVID EXAM: PORTABLE CHEST 1 VIEW COMPARISON:  October 06, 2019 FINDINGS: The heart size and mediastinal contours are unchanged with mild cardiomegaly. Tracheostomy at the level of the clavicular heads. Again noted are multifocal patchy airspace opacities throughout both lungs, however there is slight interval improvement in degree of aeration. No pleural effusion. No acute osseous abnormality. IMPRESSION: Slight interval improvement in multifocal airspace opacities. Electronically Signed   By: Prudencio Pair M.D.   On: 10/07/2019 05:56   DG C-Arm 1-60 Min-No Report  Result Date: 10/08/2019 Fluoroscopy was utilized by the requesting physician.  No  radiographic interpretation.   ECHOCARDIOGRAM COMPLETE  Result Date: 10/07/2019    ECHOCARDIOGRAM REPORT   Patient Name:   [name] Date of Exam: 10/07/2019 Medical Rec #:  [id number]      Height:       68.0 in Accession #:    [id number]     Weight:       293.2 lb Date of Birth:  06/14/1976      BSA:          2.405 m Patient Age:    21 years       BP:           154/62 mmHg Patient Gender: M              HR:           93 bpm. Exam Location:  Inpatient Procedure: 2D Echo Indications:    Cardiomyopathy-Unspecified I42.9  History:        Patient has prior history of Echocardiogram examinations, most                 recent 09/28/2019. ECMO; Risk Factors:Hypertension.  Sonographer:    Mikki Santee RDCS (AE) Referring Phys: [id number] Kipp Brood  Sonographer Comments: No SSN images due to Tracheostomy. IMPRESSIONS  1. Left ventricular ejection fraction, by estimation, is 70 to 75%. The left ventricle has hyperdynamic function. The left ventricle has no regional wall motion abnormalities. Left ventricular diastolic parameters were normal.  2. Right ventricular systolic function is normal. The right ventricular size is normal. There is moderately elevated pulmonary artery systolic pressure.  3. The mitral valve is normal in structure and function. Mild mitral valve regurgitation. No evidence of mitral stenosis.  4. The aortic valve is grossly normal. Aortic valve regurgitation is not visualized. No aortic stenosis is present. FINDINGS  Left Ventricle: Left ventricular ejection fraction, by estimation, is 70 to 75%. The left ventricle has hyperdynamic function. The left ventricle has no regional wall motion abnormalities. The left ventricular internal cavity size was normal in size. There is no left ventricular hypertrophy. Left ventricular diastolic parameters were normal. Right Ventricle: The right ventricular size is normal. No increase in right ventricular wall thickness. Right ventricular systolic function is  normal. There is moderately elevated pulmonary artery systolic pressure. The tricuspid regurgitant velocity is 3.24 m/s, and with an assumed right atrial pressure  of 8 mmHg, the estimated right ventricular systolic pressure is 84.1 mmHg. Left Atrium: Left atrial size was normal in size. Right Atrium: Right atrial size was normal in size. Pericardium: There is no evidence of pericardial effusion. Mitral Valve: The mitral valve is normal in structure and function. Mild mitral valve regurgitation. No evidence of mitral valve stenosis. Tricuspid Valve: The tricuspid valve is normal in structure. Tricuspid valve regurgitation is not demonstrated. No evidence of tricuspid stenosis. Aortic Valve: The aortic valve is grossly normal. Aortic valve regurgitation is not visualized. No aortic stenosis is present. Pulmonic Valve: The pulmonic valve was normal in structure. Pulmonic valve regurgitation is not visualized. No evidence of pulmonic stenosis. Aorta: The aortic root and ascending aorta are structurally normal, with no evidence of dilitation. IAS/Shunts: The atrial septum is grossly normal.  LEFT VENTRICLE PLAX 2D LVIDd:         5.10 cm  Diastology LVIDs:         3.10 cm  LV e' lateral:   9.90 cm/s LV PW:         0.90 cm  LV E/e' lateral: 12.3 LV IVS:        1.00 cm  LV e' medial:    10.70 cm/s LVOT diam:     2.20 cm  LV E/e' medial:  11.4 LV SV:         86 LV SV Index:   36 LVOT Area:     3.80 cm  RIGHT VENTRICLE RV S prime:     14.50 cm/s TAPSE (M-mode): 2.3 cm LEFT ATRIUM             Index       RIGHT ATRIUM           Index LA diam:        3.70 cm 1.54 cm/m  RA Area:     16.50 cm LA Vol (A2C):   85.7 ml 35.64 ml/m RA Volume:   44.60 ml  18.55 ml/m LA Vol (A4C):   56.8 ml 23.62 ml/m LA Biplane Vol: 74.9 ml 31.15 ml/m  AORTIC VALVE LVOT Vmax:   112.00 cm/s LVOT Vmean:  73.500 cm/s LVOT VTI:    0.227 m  AORTA Ao Root diam: 2.70 cm MITRAL VALVE                TRICUSPID VALVE MV Area (PHT): 3.17 cm     TR Peak  grad:   42.0 mmHg MV Decel Time: 239 msec     TR Vmax:        324.00 cm/s MV E velocity: 122.00 cm/s MV A velocity: 123.00 cm/s  SHUNTS MV E/A ratio:  0.99         Systemic VTI:  0.23 m                             Systemic Diam: 2.20 cm Mertie Moores MD Electronically signed by Mertie Moores MD Signature Date/Time: 10/07/2019/1:48:37 PM    Final      Medications:     Scheduled Medications: . sodium chloride   Intravenous Once  . sodium chloride   Intravenous Once  . acetaminophen (TYLENOL) oral liquid 160 mg/5 mL  650 mg Per Tube Q6H  . amiodarone  200 mg Per Tube Daily  . amLODipine  10 mg Per Tube Daily  . vitamin C  500 mg Per Tube Daily  . chlorhexidine gluconate (MEDLINE KIT)  15  mL Mouth Rinse BID  . Chlorhexidine Gluconate Cloth  6 each Topical Daily  . cloNIDine  0.2 mg Per Tube Q6H  . feeding supplement (OSMOLITE 1.5 CAL)  237 mL Per Tube 6 X Daily  . feeding supplement (PRO-STAT SUGAR FREE 64)  60 mL Per Tube QID  . free water  300 mL Per Tube Q6H  . Gerhardt's butt cream   Topical QID  . hydrALAZINE  100 mg Per Tube Q8H  . insulin aspart  0-20 Units Subcutaneous Q4H  . insulin aspart  5 Units Subcutaneous Q4H  . insulin detemir  12 Units Subcutaneous BID  . ipratropium-albuterol  3 mL Nebulization Q4H  . magic mouthwash  5 mL Oral TID  . mouth rinse  15 mL Mouth Rinse 10 times per day  . montelukast  10 mg Per Tube QHS  . nutrition supplement (JUVEN)  1 packet Per Tube BID BM  . pantoprazole sodium  40 mg Per Tube BID  . propranolol  80 mg Per Tube TID  . psyllium  1 packet Per Tube BID  . QUEtiapine  50 mg Per Tube QHS  . sodium chloride flush  10-40 mL Intracatheter Q12H  . testosterone  5 g Transdermal Daily  . white petrolatum   Topical BID    Infusions: . sodium chloride    . sodium chloride 250 mL (10/08/19 1626)  . sodium chloride 10 mL/hr at 10/08/19 1100  . dexmedetomidine (PRECEDEX) IV infusion 0.7 mcg/kg/hr (10/08/19 1719)  . heparin 1,650 Units/hr  (10/08/19 1700)  . niCARDipine Stopped (10/07/19 1742)    PRN Medications: Place/Maintain arterial line **AND** sodium chloride, sodium chloride, sodium chloride, acetaminophen (TYLENOL) oral liquid 160 mg/5 mL, acetaminophen, [COMPLETED] diazepam **FOLLOWED BY** diazepam, hydrALAZINE, HYDROmorphone, magnesium hydroxide, midazolam, ondansetron (ZOFRAN) IV, senna-docusate, sodium chloride, sodium chloride flush   Assessment/Plan:    1. Acute hypoxic/hypercarbic respiratory failure due to COVID PNA: Remained markedly hypoxic despite full support. Intubated 12/26. S/p bilateral CTs 12/26 for pneumothoraces.  TEE on 12/26 LVEF 70% RV ok. VV ECMO begun 12/26. Tracheostomy 12/29.  COVID ARDS at this point. ECMO circuit changed 1/8 & 1/23. Crescent cannula placed 2/2 (femoral cannuals removed) - ECMO parameters as per ECMO note - Flows look good on Crescent - Developed PNA and sepsis on 2/15.  BAL with > 100K serratia. Finished nebulized tobra. On mero. Completed 14 days  of meropenum on 2/28 - Neuro has seen and I discussed with them. 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 Continue passive ROM exercises with PT. Supportive care - Was off sweep ~ 48 hours and taken to OR today for possible decannulation but failed due to oversedation and paralytics. Recover on ECMO today. Potential reattempt tomorrow in ICU. D/w Dr. Orvan Seen and [place] Gum.  - LDH up slightly. Watch circuit closely. - Continue heparin. Level 0.39 at goal. Discussed dosing with PharmD personally. - Hgb 7.2 will transfuse 1u RBCs - Continue chest PT  2. Bilateral PTX: Due to barotrauma.  - Resolved. CTs out  3. COVID PNA: management of COVID infection per CCM. CXR with bilateral multifocal PNA progressing to likely ARDS.  - s/p 10 days of remdesivir - 12/16, 12/2 s/p tociluzimab - 12/17 s/p convalescent plasma - Decadron at 46m/daily completed 1/11 - Completed cefepime/vanc - Off Ancef  4. ID: Empiric coverage with  vancomycin/cefepime initially for possible secondary bacterial PNA, course completed.   - treated with cefepime and vanc for + cx with staph epidimidis, MSSA and serratia (sensitivities ok). - developed  recurrent PNA and sepsis 2/15 - BAL with > 100K serratia. BAL growing > 100K serratia. Completed 14 days mero on 2/28  5. Anemia: - hgb 7.2 this am.Will transfuse 1u-2u as needed to keep Hgb> 8.0   7. FEN - now with G-tube.Continue TFs  8. Hypernatremia - Na 147 -> 146  - Continue FW at current dose  9. AKI - Volume status improved.  - BUN stable at 66  10. DTIs - WOC following. Has evolving wound on left 5th toe. Continue to follow - Lesions look a bit better today  Plan:   Failed decannulation today due to oversedation and paralytics in OR Reattempt in am. Potentially at bedside   Length of Stay: 78  CRITICAL CARE Performed by: Glori Bickers  Total critical care time: 55 minutes  Critical care time was exclusive of separately billable procedures and treating other patients.  Critical care was necessary to treat or prevent imminent or life-threatening deterioration.  Critical care was time spent personally by me (independent of midlevel providers or residents) on the following activities: development of treatment plan with patient and/or surrogate as well as nursing, discussions with consultants, evaluation of patient's response to treatment, examination of patient, obtaining history from patient or surrogate, ordering and performing treatments and interventions, ordering and review of laboratory studies, ordering and review of radiographic studies, pulse oximetry and re-evaluation of patient's condition.    Glori Bickers MD 10/08/2019, 6:22 PM  Advanced Heart Failure Team Pager [phone] (M-F; [place])  Please contact [facility] for night-coverage after hours (4p -7a ) and weekends on [email or web address]

## 2019-10-08 NOTE — Progress Notes (Signed)
ANTICOAGULATION CONSULT NOTE - Follow Up Consult  Pharmacy Consult for Heparin Indication: ECMO  Labs: Recent Labs    10/07/19 0317 10/07/19 0406 10/07/19 1610 10/07/19 1616 10/07/19 1621 10/07/19 1935 10/08/19 0357 10/08/19 0421 10/08/19 0537 10/08/19 0626 10/08/19 0725 10/08/19 1625 10/08/19 1625 10/08/19 1700 10/08/19 1750  HGB  --    < > 7.6*   < > 8.5*   < > 7.2*   < >  --   --    < > 9.2*   < > 8.8* 8.2*  HCT  --    < > 27.5*   < > 25.0*   < > 26.4*   < >  --   --    < > 27.0*  --  29.8* 24.0*  PLT  --    < > 125*  --   --   --  108*  --   --   --   --   --   --  106*  --   APTT  --    < > 79*  --   --    < > >200*  --  95*  --   --   --   --  79*  --   HEPARINUNFRC  --    < > 0.29*  --   --    < > 1.74*  --   --  0.27*  --   --   --  0.22*  --   CREATININE 0.64  --   --   --  0.54*  --  0.57*  --   --   --   --   --   --   --   --    < > = values in this interval not displayed.    Assessment: 44yo male on ECMO. -heparin level= 0.22 on 1650 units/hr, hg= 8.2     Goal of Therapy:  Heparin level 0.3-0.4 units/ml   Plan:  -Increase heparin to 1750 units/hr -recheck with morning labs  Hildred Laser, PharmD Clinical Pharmacist **Pharmacist phone directory can now be found on [email or web address] ([email or web address]).  Listed under [place].

## 2019-10-08 NOTE — Op Note (Signed)
Procedure(s): Cancelled Procedure - Patient not physiologically ready Procedure Note  [name] male 44 y.o. 10/08/2019  Procedure(s) and Anesthesia Type:    * Cancelled Procedure - Patient not physiologically ready - General  Surgeon(s) and Role:    * Wonda Olds, MD - Primary   Indications:      Surgeon: Wonda Olds   Assistants: none  Anesthesia: General endotracheal anesthesia  ASA Class: 5    Procedure Detail  Cancelled Procedure - Patient not physiologically ready  The patient was brought to the OR and placed on the operating table. Anesthesia was confirmed, and mechanical ventilation was optimized without support from the extracorporeal circuit. The ABGs and end-tidal CO2 were progressively suboptimal with pH 7.11 and CO2 retention. At this point, the decision was made to forgo decannulation. He was returned to the ICU after confirming proper Crescent cannula positioning by fluoroscopy.        Complications:  * No complications entered in OR log *         Disposition: ICU - intubated and critically ill.         Condition: stable

## 2019-10-08 NOTE — Progress Notes (Signed)
Informed consent obtained from wife over the phone. Second RN Nicki Reaper), verified verbal consent for surgery.

## 2019-10-08 NOTE — Progress Notes (Signed)
Heparin level and aPTT was collected from PICC line which currently had heparin infusion going. Will recollect labs

## 2019-10-08 NOTE — Progress Notes (Addendum)
ECMO Specialist goal for the shift is to maintain good ventilation and optimal oxygenation via utilization of ECMO Pump using appropriate flows and settings to meet systemic and myocardial oxygen demands to assure adequate tissue and organ perfusion. Furthermore titration of sweep gas to 2L as patient toleration permits and to assure patient get adequate rest to be able to perform sweep trials in the morning. MD Bensimhon was updated on patient clinical presentation and he express to night shift staff the goals of care for the patient for the night. Patient is stable at this time. RN aware and at bedside.    [name], BS, RRT, RCP

## 2019-10-09 ENCOUNTER — Inpatient Hospital Stay (HOSPITAL_COMMUNITY): Payer: Managed Care, Other (non HMO)

## 2019-10-09 DIAGNOSIS — D649 Anemia, unspecified: Secondary | ICD-10-CM

## 2019-10-09 DIAGNOSIS — R9389 Abnormal findings on diagnostic imaging of other specified body structures: Secondary | ICD-10-CM

## 2019-10-09 LAB — BASIC METABOLIC PANEL
Anion gap: 9 (ref 5–15)
Anion gap: 8 (ref 5–15)
BUN: 61 mg/dL — ABNORMAL HIGH (ref 6–20)
BUN: 66 mg/dL — ABNORMAL HIGH (ref 6–20)
CO2: 33 mmol/L — ABNORMAL HIGH (ref 22–32)
CO2: 33 mmol/L — ABNORMAL HIGH (ref 22–32)
Calcium: 8.6 mg/dL — ABNORMAL LOW (ref 8.9–10.3)
Calcium: 8.6 mg/dL — ABNORMAL LOW (ref 8.9–10.3)
Chloride: 101 mmol/L (ref 98–111)
Chloride: 101 mmol/L (ref 98–111)
Creatinine, Ser: 0.61 mg/dL (ref 0.61–1.24)
Creatinine, Ser: 0.6 mg/dL — ABNORMAL LOW (ref 0.61–1.24)
GFR calc Af Amer: >60 mL/min (ref >60)
GFR calc Af Amer: >60 mL/min (ref >60)
GFR calc non Af Amer: >60 mL/min (ref >60)
GFR calc non Af Amer: >60 mL/min (ref >60)
Glucose, Bld: 93 mg/dL (ref 70–99)
Glucose, Bld: 109 mg/dL — ABNORMAL HIGH (ref 70–99)
Potassium: 4.6 mmol/L (ref 3.5–5.1)
Potassium: 4.5 mmol/L (ref 3.5–5.1)
Sodium: 143 mmol/L (ref 135–145)
Sodium: 142 mmol/L (ref 135–145)

## 2019-10-09 LAB — BPAM RBC
Blood Product Expiration Date: 202103222359
Blood Product Expiration Date: 202103232359
Blood Product Expiration Date: 202103282359
Blood Product Expiration Date: 202103292359
Blood Product Expiration Date: 202103302359
Blood Product Expiration Date: 202103302359
ISSUE DATE / TIME: 202103020604
ISSUE DATE / TIME: 202103020703
ISSUE DATE / TIME: 202103020703
ISSUE DATE / TIME: 202103020703
ISSUE DATE / TIME: 202103020703
ISSUE DATE / TIME: 202103020710
Unit Type and Rh: 5100
Unit Type and Rh: 5100
Unit Type and Rh: 5100
Unit Type and Rh: 5100
Unit Type and Rh: 5100
Unit Type and Rh: 5100

## 2019-10-09 LAB — POCT I-STAT 7, (LYTES, BLD GAS, ICA,H+H)
Acid-Base Excess: 11 mmol/L — ABNORMAL HIGH (ref 0.0–2.0)
Acid-Base Excess: 13 mmol/L — ABNORMAL HIGH (ref 0.0–2.0)
Acid-Base Excess: 10 mmol/L — ABNORMAL HIGH (ref 0.0–2.0)
Acid-Base Excess: 11 mmol/L — ABNORMAL HIGH (ref 0.0–2.0)
Acid-Base Excess: 10 mmol/L — ABNORMAL HIGH (ref 0.0–2.0)
Acid-Base Excess: 10 mmol/L — ABNORMAL HIGH (ref 0.0–2.0)
Acid-Base Excess: 11 mmol/L — ABNORMAL HIGH (ref 0.0–2.0)
Acid-Base Excess: 7 mmol/L — ABNORMAL HIGH (ref 0.0–2.0)
Acid-Base Excess: 11 mmol/L — ABNORMAL HIGH (ref 0.0–2.0)
Acid-Base Excess: 9 mmol/L — ABNORMAL HIGH (ref 0.0–2.0)
Bicarbonate: 37.2 mmol/L — ABNORMAL HIGH (ref 20.0–28.0)
Bicarbonate: 39.1 mmol/L — ABNORMAL HIGH (ref 20.0–28.0)
Bicarbonate: 36.8 mmol/L — ABNORMAL HIGH (ref 20.0–28.0)
Bicarbonate: 37.5 mmol/L — ABNORMAL HIGH (ref 20.0–28.0)
Bicarbonate: 35.9 mmol/L — ABNORMAL HIGH (ref 20.0–28.0)
Bicarbonate: 36.6 mmol/L — ABNORMAL HIGH (ref 20.0–28.0)
Bicarbonate: 37.7 mmol/L — ABNORMAL HIGH (ref 20.0–28.0)
Bicarbonate: 33 mmol/L — ABNORMAL HIGH (ref 20.0–28.0)
Bicarbonate: 37.4 mmol/L — ABNORMAL HIGH (ref 20.0–28.0)
Bicarbonate: 35.9 mmol/L — ABNORMAL HIGH (ref 20.0–28.0)
Calcium, Ion: 1.27 mmol/L (ref 1.15–1.40)
Calcium, Ion: 1.29 mmol/L (ref 1.15–1.40)
Calcium, Ion: 1.27 mmol/L (ref 1.15–1.40)
Calcium, Ion: 1.27 mmol/L (ref 1.15–1.40)
Calcium, Ion: 1.27 mmol/L (ref 1.15–1.40)
Calcium, Ion: 1.26 mmol/L (ref 1.15–1.40)
Calcium, Ion: 1.26 mmol/L (ref 1.15–1.40)
Calcium, Ion: 1.21 mmol/L (ref 1.15–1.40)
Calcium, Ion: 1.26 mmol/L (ref 1.15–1.40)
Calcium, Ion: 1.27 mmol/L (ref 1.15–1.40)
HCT: 25 % — ABNORMAL LOW (ref 39.0–52.0)
HCT: 30 % — ABNORMAL LOW (ref 39.0–52.0)
HCT: 25 % — ABNORMAL LOW (ref 39.0–52.0)
HCT: 25 % — ABNORMAL LOW (ref 39.0–52.0)
HCT: 27 % — ABNORMAL LOW (ref 39.0–52.0)
HCT: 26 % — ABNORMAL LOW (ref 39.0–52.0)
HCT: 27 % — ABNORMAL LOW (ref 39.0–52.0)
HCT: 23 % — ABNORMAL LOW (ref 39.0–52.0)
HCT: 27 % — ABNORMAL LOW (ref 39.0–52.0)
HCT: 25 % — ABNORMAL LOW (ref 39.0–52.0)
Hemoglobin: 8.5 g/dL — ABNORMAL LOW (ref 13.0–17.0)
Hemoglobin: 10.2 g/dL — ABNORMAL LOW (ref 13.0–17.0)
Hemoglobin: 8.5 g/dL — ABNORMAL LOW (ref 13.0–17.0)
Hemoglobin: 8.5 g/dL — ABNORMAL LOW (ref 13.0–17.0)
Hemoglobin: 9.2 g/dL — ABNORMAL LOW (ref 13.0–17.0)
Hemoglobin: 8.8 g/dL — ABNORMAL LOW (ref 13.0–17.0)
Hemoglobin: 9.2 g/dL — ABNORMAL LOW (ref 13.0–17.0)
Hemoglobin: 7.8 g/dL — ABNORMAL LOW (ref 13.0–17.0)
Hemoglobin: 9.2 g/dL — ABNORMAL LOW (ref 13.0–17.0)
Hemoglobin: 8.5 g/dL — ABNORMAL LOW (ref 13.0–17.0)
O2 Saturation: 99 %
O2 Saturation: 99 %
O2 Saturation: 91 %
O2 Saturation: 97 %
O2 Saturation: 90 %
O2 Saturation: 93 %
O2 Saturation: 92 %
O2 Saturation: 93 %
O2 Saturation: 95 %
O2 Saturation: 91 %
Patient temperature: 37
Patient temperature: 37
Patient temperature: 37
Patient temperature: 98.7
Patient temperature: 98.7
Patient temperature: 37.4
Patient temperature: 99.2
Potassium: 4.5 mmol/L (ref 3.5–5.1)
Potassium: 4.5 mmol/L (ref 3.5–5.1)
Potassium: 4.3 mmol/L (ref 3.5–5.1)
Potassium: 4.5 mmol/L (ref 3.5–5.1)
Potassium: 4.4 mmol/L (ref 3.5–5.1)
Potassium: 4.4 mmol/L (ref 3.5–5.1)
Potassium: 4.6 mmol/L (ref 3.5–5.1)
Potassium: 4 mmol/L (ref 3.5–5.1)
Potassium: 4.5 mmol/L (ref 3.5–5.1)
Potassium: 4.4 mmol/L (ref 3.5–5.1)
Sodium: 145 mmol/L (ref 135–145)
Sodium: 145 mmol/L (ref 135–145)
Sodium: 144 mmol/L (ref 135–145)
Sodium: 144 mmol/L (ref 135–145)
Sodium: 144 mmol/L (ref 135–145)
Sodium: 144 mmol/L (ref 135–145)
Sodium: 144 mmol/L (ref 135–145)
Sodium: 145 mmol/L (ref 135–145)
Sodium: 144 mmol/L (ref 135–145)
Sodium: 144 mmol/L (ref 135–145)
TCO2: 39 mmol/L — ABNORMAL HIGH (ref 22–32)
TCO2: 41 mmol/L — ABNORMAL HIGH (ref 22–32)
TCO2: 39 mmol/L — ABNORMAL HIGH (ref 22–32)
TCO2: 39 mmol/L — ABNORMAL HIGH (ref 22–32)
TCO2: 38 mmol/L — ABNORMAL HIGH (ref 22–32)
TCO2: 38 mmol/L — ABNORMAL HIGH (ref 22–32)
TCO2: 40 mmol/L — ABNORMAL HIGH (ref 22–32)
TCO2: 35 mmol/L — ABNORMAL HIGH (ref 22–32)
TCO2: 39 mmol/L — ABNORMAL HIGH (ref 22–32)
TCO2: 38 mmol/L — ABNORMAL HIGH (ref 22–32)
pCO2 arterial: 58.5 mmHg — ABNORMAL HIGH (ref 32.0–48.0)
pCO2 arterial: 60.9 mmHg — ABNORMAL HIGH (ref 32.0–48.0)
pCO2 arterial: 61.8 mmHg — ABNORMAL HIGH (ref 32.0–48.0)
pCO2 arterial: 62.8 mmHg — ABNORMAL HIGH (ref 32.0–48.0)
pCO2 arterial: 58.8 mmHg — ABNORMAL HIGH (ref 32.0–48.0)
pCO2 arterial: 60.2 mmHg — ABNORMAL HIGH (ref 32.0–48.0)
pCO2 arterial: 61 mmHg — ABNORMAL HIGH (ref 32.0–48.0)
pCO2 arterial: 56.3 mmHg — ABNORMAL HIGH (ref 32.0–48.0)
pCO2 arterial: 62 mmHg — ABNORMAL HIGH (ref 32.0–48.0)
pCO2 arterial: 62.7 mmHg — ABNORMAL HIGH (ref 32.0–48.0)
pH, Arterial: 7.412 (ref 7.350–7.450)
pH, Arterial: 7.416 (ref 7.350–7.450)
pH, Arterial: 7.383 (ref 7.350–7.450)
pH, Arterial: 7.384 (ref 7.350–7.450)
pH, Arterial: 7.393 (ref 7.350–7.450)
pH, Arterial: 7.392 (ref 7.350–7.450)
pH, Arterial: 7.399 (ref 7.350–7.450)
pH, Arterial: 7.377 (ref 7.350–7.450)
pH, Arterial: 7.391 (ref 7.350–7.450)
pH, Arterial: 7.367 (ref 7.350–7.450)
pO2, Arterial: 126 mmHg — ABNORMAL HIGH (ref 83.0–108.0)
pO2, Arterial: 139 mmHg — ABNORMAL HIGH (ref 83.0–108.0)
pO2, Arterial: 100 mmHg (ref 83.0–108.0)
pO2, Arterial: 66 mmHg — ABNORMAL LOW (ref 83.0–108.0)
pO2, Arterial: 60 mmHg — ABNORMAL LOW (ref 83.0–108.0)
pO2, Arterial: 71 mmHg — ABNORMAL LOW (ref 83.0–108.0)
pO2, Arterial: 66 mmHg — ABNORMAL LOW (ref 83.0–108.0)
pO2, Arterial: 70 mmHg — ABNORMAL LOW (ref 83.0–108.0)
pO2, Arterial: 79 mmHg — ABNORMAL LOW (ref 83.0–108.0)
pO2, Arterial: 66 mmHg — ABNORMAL LOW (ref 83.0–108.0)

## 2019-10-09 LAB — BODY FLUID CELL COUNT WITH DIFFERENTIAL
Eos, Fluid: 3 %
Eos, Fluid: 6 %
Lymphs, Fluid: 12 %
Lymphs, Fluid: 6 %
Monocyte-Macrophage-Serous Fluid: 38 % — ABNORMAL LOW (ref 50–90)
Monocyte-Macrophage-Serous Fluid: 42 % — ABNORMAL LOW (ref 50–90)
Neutrophil Count, Fluid: 40 % — ABNORMAL HIGH (ref 0–25)
Neutrophil Count, Fluid: 53 % — ABNORMAL HIGH (ref 0–25)
Total Nucleated Cell Count, Fluid: 142 cu mm (ref 0–1000)
Total Nucleated Cell Count, Fluid: 208 cu mm (ref 0–1000)

## 2019-10-09 LAB — CBC
HCT: 27.6 % — ABNORMAL LOW (ref 39.0–52.0)
HCT: 29 % — ABNORMAL LOW (ref 39.0–52.0)
Hemoglobin: 8 g/dL — ABNORMAL LOW (ref 13.0–17.0)
Hemoglobin: 8.3 g/dL — ABNORMAL LOW (ref 13.0–17.0)
MCH: 28.3 pg (ref 26.0–34.0)
MCH: 27.8 pg (ref 26.0–34.0)
MCHC: 29 g/dL — ABNORMAL LOW (ref 30.0–36.0)
MCHC: 28.6 g/dL — ABNORMAL LOW (ref 30.0–36.0)
MCV: 97.5 fL (ref 80.0–100.0)
MCV: 97 fL (ref 80.0–100.0)
Platelets: 102 10*3/uL — ABNORMAL LOW (ref 150–400)
Platelets: 115 10*3/uL — ABNORMAL LOW (ref 150–400)
RBC: 2.83 MIL/uL — ABNORMAL LOW (ref 4.22–5.81)
RBC: 2.99 MIL/uL — ABNORMAL LOW (ref 4.22–5.81)
RDW: 18.1 % — ABNORMAL HIGH (ref 11.5–15.5)
RDW: 18.3 % — ABNORMAL HIGH (ref 11.5–15.5)
WBC: 6.9 10*3/uL (ref 4.0–10.5)
WBC: 9.4 10*3/uL (ref 4.0–10.5)
nRBC: 0 % (ref 0.0–0.2)
nRBC: 0 % (ref 0.0–0.2)

## 2019-10-09 LAB — MAGNESIUM: Magnesium: 2 mg/dL (ref 1.7–2.4)

## 2019-10-09 LAB — TYPE AND SCREEN
ABO/RH(D): O POS
Antibody Screen: NEGATIVE
Unit division: 0
Unit division: 0
Unit division: 0
Unit division: 0
Unit division: 0
Unit division: 0

## 2019-10-09 LAB — LACTIC ACID, PLASMA: Lactic Acid, Venous: 1.1 mmol/L (ref 0.5–1.9)

## 2019-10-09 LAB — COOXEMETRY PANEL
Carboxyhemoglobin: 3.4 % — ABNORMAL HIGH (ref 0.5–1.5)
Methemoglobin: 1.2 % (ref 0.0–1.5)
O2 Saturation: 79.1 %
Total hemoglobin: 10 g/dL — ABNORMAL LOW (ref 12.0–16.0)

## 2019-10-09 LAB — APTT
aPTT: 99 seconds — ABNORMAL HIGH (ref 24–36)
aPTT: 68 seconds — ABNORMAL HIGH (ref 24–36)

## 2019-10-09 LAB — HEPARIN LEVEL (UNFRACTIONATED)
Heparin Unfractionated: 0.32 IU/mL (ref 0.30–0.70)
Heparin Unfractionated: 0.33 IU/mL (ref 0.30–0.70)

## 2019-10-09 LAB — FIBRINOGEN: Fibrinogen: 285 mg/dL (ref 210–475)

## 2019-10-09 LAB — GLUCOSE, CAPILLARY
Glucose-Capillary: 78 mg/dL (ref 70–99)
Glucose-Capillary: 93 mg/dL (ref 70–99)
Glucose-Capillary: 139 mg/dL — ABNORMAL HIGH (ref 70–99)
Glucose-Capillary: 105 mg/dL — ABNORMAL HIGH (ref 70–99)
Glucose-Capillary: 94 mg/dL (ref 70–99)

## 2019-10-09 LAB — PHOSPHORUS: Phosphorus: 3.9 mg/dL (ref 2.5–4.6)

## 2019-10-09 LAB — LACTATE DEHYDROGENASE: LDH: 409 U/L — ABNORMAL HIGH (ref 98–192)

## 2019-10-09 MED ORDER — LIDOCAINE HCL (PF) 1 % IJ SOLN
10.0000 mL | Freq: Once | INTRAMUSCULAR | Status: AC
  Administered 2019-10-09: 5 mL

## 2019-10-09 MED ORDER — PROPOFOL 1000 MG/100ML IV EMUL
5.0000 ug/kg/min | INTRAVENOUS | Status: DC
  Administered 2019-10-09: 20 ug/kg/min via INTRAVENOUS

## 2019-10-09 MED ORDER — FUROSEMIDE 10 MG/ML IJ SOLN
40.0000 mg | Freq: Once | INTRAMUSCULAR | Status: AC
  Administered 2019-10-09: 40 mg via INTRAVENOUS
  Filled 2019-10-09: qty 4

## 2019-10-09 MED ORDER — PROPOFOL BOLUS VIA INFUSION
10.0000 mg/kg | Freq: Once | INTRAVENOUS | Status: AC
  Administered 2019-10-09: 1337 mg via INTRAVENOUS

## 2019-10-09 MED ORDER — PROPOFOL 1000 MG/100ML IV EMUL
INTRAVENOUS | Status: AC
  Filled 2019-10-09: qty 100

## 2019-10-09 NOTE — Progress Notes (Signed)
Patient ID: [name], male   DOB: 17-Feb-1976, 44 y.o.   MRN: [id number]    Advanced Heart Failure Rounding Note   Subjective:    Failed decannulation 3/2 due to oversedation and paralytics in OR. Back on sweep 2.   CXR worse today but oxygenation ok.   Awake on ECMO. Following commands. Remains very weak.   Labs:  7.41/61/139/99% Hgb 8.0 LDH 378 -> 383 -> 385 -> 379 -> 404 -> 409 Heparin level: 0.32 Lactic acid 1.1  BUN 66   ECMO parameters - see separate ecmo note   Objective:   Weight Range:  Vital Signs:   Temp:  [98.1 F (36.7 C)-98.8 F (37.1 C)] 98.6 F (37 C) (03/03 0400) Pulse Rate:  [60-87] 70 (03/03 0725) Resp:  [0-28] 25 (03/03 0725) BP: (110-144)/(44-68) 129/58 (03/03 0400) SpO2:  [98 %-100 %] 100 % (03/03 0725) Arterial Line BP: (105-179)/(44-82) 131/55 (03/03 0600) FiO2 (%):  [40 %] 40 % (03/03 0725) Weight:  [133.7 kg] 133.7 kg (03/03 0500) Last BM Date: 10/08/19  Weight change: Filed Weights   10/07/19 0500 10/08/19 0500 10/09/19 0500  Weight: 133 kg 133 kg 133.7 kg    Intake/Output:   Intake/Output Summary (Last 24 hours) at 10/09/2019 0836 Last data filed at 10/09/2019 0600 Gross per 24 hour  Intake 3656.3 ml  Output 2725 ml  Net 931.3 ml     Physical Exam (prio to going to OR): General:  Awake on ecmo. Follows commands HEENT: normal Neck: supple. + trach. +RIJ cannula Carotids 2+ bilat; no bruits. No lymphadenopathy or thryomegaly appreciated. Cor: PMI nondisplaced. Regular rate & rhythm. No rubs, gallops or murmurs. Lungs: coarse Abdomen: obese soft, nontender, nondistended. No hepatosplenomegaly. No bruits or masses. Good bowel sounds. + PEG tube  Extremities: no cyanosis, clubbing, rash, 1-2+ edema Neuro: awake follows commands. Weak  Telemetry: Sinus 70-80s Personally reviewed   Labs: Basic Metabolic Panel: Recent Labs  Lab 10/05/19 0355 10/05/19 0419 10/06/19 0353 10/06/19 0353 10/06/19 0407  10/07/19 0312 10/07/19 0317 10/07/19 0406 10/07/19 1621 10/07/19 1935 10/08/19 0357 10/08/19 0421 10/08/19 1750 10/08/19 1947 10/08/19 2314 10/09/19 0229 10/09/19 0411 10/09/19 0420  NA 143   < > 144   < >   < >  --  147*   < > 145  145   < > 146*   < > 145 144 146* 145  --  145  K 4.6   < > 4.5   < >   < >  --  4.7   < > 4.7  4.9   < > 4.9   < > 4.2 4.5 4.8 4.5  --  4.5  CL 109  --  105  --   --   --  106  --  107  --  105  --   --   --   --   --   --   --   CO2 30  --  32  --   --   --  34*  --  34*  --  33*  --   --   --   --   --   --   --   GLUCOSE 69*  --  74  --   --   --  74  --  75  --  87  --   --   --   --   --   --   --   BUN  76*  --  70*  --   --   --  68*  --  69*  --  66*  --   --   --   --   --   --   --   CREATININE 0.84  --  0.63  --   --   --  0.64  --  0.54*  --  0.57*  --   --   --   --   --   --   --   CALCIUM 8.5*   < > 8.7*   < >  --   --  8.9  --  8.3*  --  8.5*  --   --   --   --   --   --   --   MG 2.1  --  2.0  --   --  1.9  --   --   --   --  2.2  --   --   --   --   --  2.0  --   PHOS 3.5   < > 3.0  3.3  --   --  3.6 3.6  --   --   --  3.6  --   --   --   --   --  3.9  --    < > = values in this interval not displayed.    Liver Function Tests: Recent Labs  Lab 10/06/19 0353 10/07/19 0317  ALBUMIN 1.9* 1.9*   No results for input(s): LIPASE, AMYLASE in the last 168 hours. No results for input(s): AMMONIA in the last 168 hours.  CBC: Recent Labs  Lab 10/07/19 0312 10/07/19 0406 10/07/19 1610 10/07/19 1616 10/08/19 0357 10/08/19 0421 10/08/19 1700 10/08/19 1750 10/08/19 1947 10/08/19 2314 10/09/19 0229 10/09/19 0411 10/09/19 0420  WBC 8.4  --  12.5*  --  8.5  --  9.5  --   --   --   --  6.9  --   HGB 7.4*   < > 7.6*   < > 7.2*   < > 8.8*   < > 8.5* 9.2* 8.5* 8.0* 10.2*  HCT 26.8*   < > 27.5*   < > 26.4*   < > 29.8*   < > 25.0* 27.0* 25.0* 27.6* 30.0*  MCV 97.5  --  99.3  --  99.2  --  95.8  --   --   --   --  97.5  --   PLT 104*   --  125*  --  108*  --  106*  --   --   --   --  102*  --    < > = values in this interval not displayed.    Cardiac Enzymes: No results for input(s): CKTOTAL, CKMB, CKMBINDEX, TROPONINI in the last 168 hours.  BNP: BNP (last 3 results) Recent Labs    07/22/19 1039  BNP 15.3    ProBNP (last 3 results) No results for input(s): PROBNP in the last 8760 hours.    Other results:  Imaging: DG Chest 1 View  Result Date: 10/08/2019 CLINICAL DATA:  Tracheostomy, COVID pneumonia EXAM: CHEST  1 VIEW COMPARISON:  October 07, 2019 FINDINGS: The heart size and mediastinal contours are unchanged. Tracheostomy tube seen with the tip at the clavicular heads. Slight interval improvement in the multifocal patchy airspace opacities throughout both lungs. No pleural effusion. No acute osseous abnormality. IMPRESSION: Slight interval improvement in the  multifocal airspace opacities. Electronically Signed   By: Prudencio Pair M.D.   On: 10/08/2019 06:46   DG C-Arm 1-60 Min-No Report  Result Date: 10/08/2019 Fluoroscopy was utilized by the requesting physician.  No radiographic interpretation.   ECHOCARDIOGRAM COMPLETE  Result Date: 10/07/2019    ECHOCARDIOGRAM REPORT   Patient Name:   [name] Date of Exam: 10/07/2019 Medical Rec #:  [id number]      Height:       68.0 in Accession #:    [id number]     Weight:       293.2 lb Date of Birth:  1975/09/16      BSA:          2.405 m Patient Age:    45 years       BP:           154/62 mmHg Patient Gender: M              HR:           93 bpm. Exam Location:  Inpatient Procedure: 2D Echo Indications:    Cardiomyopathy-Unspecified I42.9  History:        Patient has prior history of Echocardiogram examinations, most                 recent 09/28/2019. ECMO; Risk Factors:Hypertension.  Sonographer:    Mikki Santee RDCS (AE) Referring Phys: [id number] Kipp Brood  Sonographer Comments: No SSN images due to Tracheostomy. IMPRESSIONS  1. Left ventricular ejection fraction,  by estimation, is 70 to 75%. The left ventricle has hyperdynamic function. The left ventricle has no regional wall motion abnormalities. Left ventricular diastolic parameters were normal.  2. Right ventricular systolic function is normal. The right ventricular size is normal. There is moderately elevated pulmonary artery systolic pressure.  3. The mitral valve is normal in structure and function. Mild mitral valve regurgitation. No evidence of mitral stenosis.  4. The aortic valve is grossly normal. Aortic valve regurgitation is not visualized. No aortic stenosis is present. FINDINGS  Left Ventricle: Left ventricular ejection fraction, by estimation, is 70 to 75%. The left ventricle has hyperdynamic function. The left ventricle has no regional wall motion abnormalities. The left ventricular internal cavity size was normal in size. There is no left ventricular hypertrophy. Left ventricular diastolic parameters were normal. Right Ventricle: The right ventricular size is normal. No increase in right ventricular wall thickness. Right ventricular systolic function is normal. There is moderately elevated pulmonary artery systolic pressure. The tricuspid regurgitant velocity is 3.24 m/s, and with an assumed right atrial pressure of 8 mmHg, the estimated right ventricular systolic pressure is 10.1 mmHg. Left Atrium: Left atrial size was normal in size. Right Atrium: Right atrial size was normal in size. Pericardium: There is no evidence of pericardial effusion. Mitral Valve: The mitral valve is normal in structure and function. Mild mitral valve regurgitation. No evidence of mitral valve stenosis. Tricuspid Valve: The tricuspid valve is normal in structure. Tricuspid valve regurgitation is not demonstrated. No evidence of tricuspid stenosis. Aortic Valve: The aortic valve is grossly normal. Aortic valve regurgitation is not visualized. No aortic stenosis is present. Pulmonic Valve: The pulmonic valve was normal in  structure. Pulmonic valve regurgitation is not visualized. No evidence of pulmonic stenosis. Aorta: The aortic root and ascending aorta are structurally normal, with no evidence of dilitation. IAS/Shunts: The atrial septum is grossly normal.  LEFT VENTRICLE PLAX 2D LVIDd:         5.10  cm  Diastology LVIDs:         3.10 cm  LV e' lateral:   9.90 cm/s LV PW:         0.90 cm  LV E/e' lateral: 12.3 LV IVS:        1.00 cm  LV e' medial:    10.70 cm/s LVOT diam:     2.20 cm  LV E/e' medial:  11.4 LV SV:         86 LV SV Index:   36 LVOT Area:     3.80 cm  RIGHT VENTRICLE RV S prime:     14.50 cm/s TAPSE (M-mode): 2.3 cm LEFT ATRIUM             Index       RIGHT ATRIUM           Index LA diam:        3.70 cm 1.54 cm/m  RA Area:     16.50 cm LA Vol (A2C):   85.7 ml 35.64 ml/m RA Volume:   44.60 ml  18.55 ml/m LA Vol (A4C):   56.8 ml 23.62 ml/m LA Biplane Vol: 74.9 ml 31.15 ml/m  AORTIC VALVE LVOT Vmax:   112.00 cm/s LVOT Vmean:  73.500 cm/s LVOT VTI:    0.227 m  AORTA Ao Root diam: 2.70 cm MITRAL VALVE                TRICUSPID VALVE MV Area (PHT): 3.17 cm     TR Peak grad:   42.0 mmHg MV Decel Time: 239 msec     TR Vmax:        324.00 cm/s MV E velocity: 122.00 cm/s MV A velocity: 123.00 cm/s  SHUNTS MV E/A ratio:  0.99         Systemic VTI:  0.23 m                             Systemic Diam: 2.20 cm Mertie Moores MD Electronically signed by Mertie Moores MD Signature Date/Time: 10/07/2019/1:48:37 PM    Final      Medications:     Scheduled Medications: . sodium chloride   Intravenous Once  . sodium chloride   Intravenous Once  . acetaminophen (TYLENOL) oral liquid 160 mg/5 mL  650 mg Per Tube Q6H  . amiodarone  200 mg Per Tube Daily  . amLODipine  10 mg Per Tube Daily  . vitamin C  500 mg Per Tube Daily  . chlorhexidine gluconate (MEDLINE KIT)  15 mL Mouth Rinse BID  . Chlorhexidine Gluconate Cloth  6 each Topical Daily  . cloNIDine  0.2 mg Per Tube Q6H  . feeding supplement (OSMOLITE 1.5 CAL)  237 mL  Per Tube 6 X Daily  . feeding supplement (PRO-STAT SUGAR FREE 64)  60 mL Per Tube QID  . free water  300 mL Per Tube Q6H  . Gerhardt's butt cream   Topical QID  . hydrALAZINE  100 mg Per Tube Q8H  . insulin aspart  0-20 Units Subcutaneous Q4H  . insulin aspart  5 Units Subcutaneous Q4H  . insulin detemir  12 Units Subcutaneous BID  . ipratropium-albuterol  3 mL Nebulization Q4H  . lidocaine (PF)  10 mL Other Once  . magic mouthwash  5 mL Oral TID  . mouth rinse  15 mL Mouth Rinse 10 times per day  . montelukast  10 mg Per Tube QHS  .  nutrition supplement (JUVEN)  1 packet Per Tube BID BM  . pantoprazole sodium  40 mg Per Tube BID  . propranolol  80 mg Per Tube TID  . psyllium  1 packet Per Tube BID  . QUEtiapine  50 mg Per Tube QHS  . sodium chloride flush  10-40 mL Intracatheter Q12H  . testosterone  5 g Transdermal Daily  . white petrolatum   Topical BID    Infusions: . sodium chloride    . sodium chloride 250 mL (10/08/19 1626)  . sodium chloride 10 mL/hr at 10/09/19 0100  . dexmedetomidine (PRECEDEX) IV infusion 0.5 mcg/kg/hr (10/09/19 0600)  . heparin 1,750 Units/hr (10/09/19 0600)  . niCARDipine Stopped (10/07/19 1742)  . propofol (DIPRIVAN) infusion 20 mcg/kg/min (10/09/19 0828)  . propofol      PRN Medications: Place/Maintain arterial line **AND** sodium chloride, sodium chloride, sodium chloride, acetaminophen (TYLENOL) oral liquid 160 mg/5 mL, acetaminophen, [COMPLETED] diazepam **FOLLOWED BY** diazepam, hydrALAZINE, HYDROmorphone, magnesium hydroxide, midazolam, ondansetron (ZOFRAN) IV, senna-docusate, sodium chloride, sodium chloride flush   Assessment/Plan:    1. Acute hypoxic/hypercarbic respiratory failure due to COVID PNA: Remained markedly hypoxic despite full support. Intubated 12/26. S/p bilateral CTs 12/26 for pneumothoraces.  TEE on 12/26 LVEF 70% RV ok. VV ECMO begun 12/26. Tracheostomy 12/29.  COVID ARDS at this point. ECMO circuit changed 1/8 & 1/23.  Crescent cannula placed 2/2 (femoral cannuals removed) - Failed decannulation on 3/2. Back on sweep 2 - ECMO parameters as per ECMO note - Flows look good on Crescent - CXR worse today  - Completed 14 days  of meropenum on 2/28. Repeat bronch today. Give 1 dose iv lasix - Repeat Sweep trial after bronch and diuresis - Potential reattempt tomorrow in ICU. D/w Dr. Orvan Seen and [place] Gum.  - LDH up slightly. Watch circuit closely. - Continue heparin. Level 0.32 at goal. Discussed dosing with PharmD personally. - Hgb 8.0  - Continue chest PT  2. Bilateral PTX: Due to barotrauma.  - Resolved. CTs out  3. COVID PNA: management of COVID infection per CCM. CXR with bilateral multifocal PNA progressing to likely ARDS.  - s/p 10 days of remdesivir - 12/16, 12/2 s/p tociluzimab - 12/17 s/p convalescent plasma - Decadron at 26m/daily completed 1/11 - Completed cefepime/vanc - Off Ancef  4. ID: Empiric coverage with vancomycin/cefepime initially for possible secondary bacterial PNA, course completed.   - treated with cefepime and vanc for + cx with staph epidimidis, MSSA and serratia (sensitivities ok). - developed recurrent PNA and sepsis 2/15 - BAL with > 100K serratia. BAL growing > 100K serratia. Completed 14 days mero on 2/28 - Repeat bronch today  5. Anemia: - hgb 8.0 this am.  6. FEN - now with G-tube.Continue TFs  8. Hypernatremia - Na 147 -> 146. BMET pending - Continue FW at current dose  9. AKI - Volume status up. Give lasix   10. DTIs - WOC following. Has evolving wound on left 5th toe. Continue to follow - Lesions stable today  Plan:  Failed decannulation 3/2 due to oversedation and paralytics in OR CXR worse today. Back on sweep 2.  Diurese. Repeat bronch.  Transfuse Repeat sweep trial later today  Length of Stay: 79  CRITICAL CARE Performed by: BGlori Bickers Total critical care time: 45 minutes  Critical care time was exclusive of separately  billable procedures and treating other patients.  Critical care was necessary to treat or prevent imminent or life-threatening deterioration.  Critical care was time  spent personally by me (independent of midlevel providers or residents) on the following activities: development of treatment plan with patient and/or surrogate as well as nursing, discussions with consultants, evaluation of patient's response to treatment, examination of patient, obtaining history from patient or surrogate, ordering and performing treatments and interventions, ordering and review of laboratory studies, ordering and review of radiographic studies, pulse oximetry and re-evaluation of patient's condition.    Glori Bickers MD 10/09/2019, 8:36 AM  Advanced Heart Failure Team Pager [phone] (M-F; [place])  Please contact [facility] for night-coverage after hours (4p -7a ) and weekends on [email or web address]

## 2019-10-09 NOTE — Progress Notes (Signed)
ANTICOAGULATION CONSULT NOTE - Follow Up Consult  Pharmacy Consult for Heparin Indication: ECMO  Labs: Recent Labs    10/08/19 0357 10/08/19 0421 10/08/19 1700 10/08/19 1750 10/09/19 0411 10/09/19 0420 10/09/19 0919 10/09/19 1133 10/09/19 1443 10/09/19 1443 10/09/19 1631 10/09/19 1641  HGB 7.2*   < > 8.8*   < > 8.0*   < >  --    < > 8.8*   < > 8.3* 9.2*  HCT 26.4*   < > 29.8*   < > 27.6*   < >  --    < > 26.0*  --  29.0* 27.0*  PLT 108*   < > 106*  --  102*  --   --   --   --   --  115*  --   APTT >200*   < > 79*  --  99*  --   --   --   --   --  68*  --   HEPARINUNFRC 1.74*   < > 0.22*  --  0.32  --   --   --   --   --  0.33  --   CREATININE 0.57*  --   --   --   --   --  0.61  --   --   --  0.60*  --    < > = values in this interval not displayed.    Assessment: 44yo male on ECMO.  Heparin level therapeutic at 0.33 units/mL.  No bleeding reported.   Goal of Therapy:  Heparin level 0.3-0.4 units/ml   Plan:  Continue IV heparin at 1750 units/hr Heparin levels and CBC with routine ECMO labs. Monitor for bleeding  Colyn Miron D. Mina Marble, PharmD, BCPS, [place] 10/09/2019, 6:32 PM

## 2019-10-09 NOTE — Progress Notes (Signed)
NAME:  [name], MRN:  [id number], DOB:  February 18, 1976, LOS: 75 ADMISSION DATE:  07/22/2019, CONSULTATION DATE:  12/24 REFERRING MD:  Sloan Leiter, CHIEF COMPLAINT:  Dyspnea   Brief History   44 y/o male admitted 12/14 with COVID pneumonia leading to ARDS, pneumomediastinum.  Intubated 12/26, bilateral chest tubes placed.  Past Medical History  GERD HTN Asthma  Significant Hospital Events   12/14 admit with hypoxemic respiratory failure in setting of COVID-19 pneumonia 12/23 noted to have pneumomediastinum on chest x-ray 12/24PCCM consulted for evaluation 12/26 worsening hypoxemia, intubated 12/27 VV fem/fem ECMO cannulation 12/29 tracheostomy >> See bedside nursing event notes for full rundown of procedures after 12/27 1/5 epistaxis, nasal packing 1/30 improved cxr, improved lung compliance, TV 350s on 15PC  1/31 CXR increased infiltrates, +CFB     2/2 Converted to right IJ Crescent cannula. 2/3 PEG tube inserted at bedside 2/14 30 min sweep trial> paO2 105 (18 PEEP, 60% FiO2) pH 7.22, pCO2 87 2/20 2 hours sweep trial 2/21 1.5 hour sweep trial 2/24 7-hour sweep trial.  Increasingly awake. 3/2 - decannulation attempt aborted due to increased hypercapnia when sedated for procedure  Consults:  PCCM, CTS, CHF, Palliative  Procedures:  PICC 1/3 >> R radial a-line 1/20 >> R IJ ECMO cath 2/3 >> Tracheostomy 12/29 >>  Significant Diagnostic Tests:  CT chest 12/22>>extensive pneumomediastinum, multifocal patchy bilateral groundglass opacities CXR 1/25 >> improving bilateral infiltrates. ECMO cannulae in place. CXR 2/5 >> clearing bilateral infiltrates  Micro Data:  BCx2 12/14 >>negative  Sputum 12/15 >> OPF 12/28 blood > negative 12/29 > staph aureus 1/4 bronch:  1/2 acid fast smear bronch: negative 1/2 fungal cx bronch: negative1/12 BAL: serratia, staph epidermidisu 1/4 BAL > MSSA, Serratia 1/9 blood > negative 1/9 Resp culture > MSSA, serratia 1/11 serratia >  negative 1/12 fungus culture > negative 1/12 BAL > serratia 1/18 BAL >> rare Serratia. 1/24 blood > negative 1/30 blood > negative 1/30 resp culture > serratia 2/1 BAL >> rare Serratia - enterococcus 2/1 aspergillus Ag > negative 2/2 resp culture > serratia  2/2 blood > negative 2/5 urine > negative 2/9 resp: serratia 2/15 BAL serratia  Antimicrobials:  Received remdesivir, steroids, actemra and convalescent plasma  5 days cefepime 12/24-12/29 Cefepime 1/4->1/13 vanc 1/4> 1/11 Ceftriaxone 1/14 > 1/27 vanc x1 1/20 Cefazolin 1/29 > 2/2 cefuroxim 2/2 vanc 2/2 Ceftriaxone x1 2/4 Meropenem 2/15>>2/21 Tobramycin inhaled 2/15>>2/21  Interim history/subjective:  Unable to be decannulated yesterday.  Continues to be bothered by IJ cannulas.  Did well with PT.  Doing well on minimal sedation.  Objective   Blood pressure (!) 129/58, pulse 86, temperature 98.7 F (37.1 C), temperature source Oral, resp. rate 20, height _0  (1.727 m), weight 133.7 kg, SpO2 100 %. CVP:  [2 mmHg-20 mmHg] 9 mmHg  Vent Mode: PCV FiO2 (%):  [40 %] 40 % Set Rate:  [20 bmp] 20 bmp PEEP:  [10 cmH20] 10 cmH20 Plateau Pressure:  [16 cmH20-29 cmH20] 29 cmH20   Intake/Output Summary (Last 24 hours) at 10/09/2019 1856 Last data filed at 10/09/2019 1800 Gross per 24 hour  Intake 4625.72 ml  Output 3375 ml  Net 1250.72 ml   Filed Weights   10/07/19 0500 10/08/19 0500 10/09/19 0500  Weight: 133 kg 133 kg 133.7 kg    Examination: GEN: Middle-age man lying in bed no acute distress on ECMO mechanical ventilation HEENT: [place]/AT, eyes anicteric.  Oral mucosa moist. Neck: trach in place without bleeding or secretions CV:  Regular rate and rhythm, no murmurs  PULM: Minimal rhonchi bilaterally, mild tachypnea, breathing above the vent. GI: Soft, nontender, nondistended.  PEG in place. EXT:  Persistent lower extremity edema. NEURO: Communicating nonverbally, attempting to talk.  Moving upper extremities more than  lower extremities, but globally weak.  Able to shrug shoulders and move his head easily. PSYCH: RASS 0, CAM ICU negative SKIN: Pallor, no significant bruising.  No petechiae.  Wound on heels and right knee.  ABG    Component Value Date/Time   PHART 7.377 10/09/2019 1846   PCO2ART 56.3 (H) 10/09/2019 1846   PO2ART 70.0 (L) 10/09/2019 1846   HCO3 33.0 (H) 10/09/2019 1846   TCO2 35 (H) 10/09/2019 1846   ACIDBASEDEF 4.0 (H) 09/16/2019 2334   O2SAT 93.0 10/09/2019 1846     Resolved Hospital Problem list   Pneumothoraces-resolved.  Assessment & Plan:   ARDS due to COVID 19, requiring mechanical ventilation and VV ECMO  --Continue lung protective ventilation-pressure control with target tidal volumes 4 to 8 cc/kg ideal body weight with goal plateau less than 30 and driving pressure less than 15. -Continue ECMO-repeat sweep trial today.  Close to decannulation. -Bronc today-no signs of ongoing infection.  Bilateral BAL sent, both with relatively low cell counts. -VAP prevention protocol -Routine trach care  AKI with azotemia - improving  Positive cumulative fluid balance Hypernatremia has corrected Severe third spacing -Continue diuresis  Hypertension -Amlodipine, clonidine, propranolol, and hydralazine -Manage pain and anxiety  Serratia pneumonia> treated Stop date 2/22 on antibiotics -BAL with bilateral cultures resent  Neuromuscular weakness -PT, OT, manage sedation to facilitate ongoing rehabilitation. -Continue testosterone supplementation  Bilateral lower feet deep tissue injury -Appreciate wound care's assistance  Anemia due to critical illness, increased RBC turnover -Continue to monitor -Transfuse for hemoglobin less than 7  Best practice:  Pain/Anxiety/Delirium protocol (if indicated): PRNs only VAP protocol (if indicated): Bundle in place. Respiratory support goals: Continue current ultra lung protective strategy.  DVT prophylaxis: Heparin drip, goal  0.3-0.5 GI prophylaxis: PPI Nutrition: TF Fluid status goals:  See above Urinary catheter: Guide hemodynamic management Glucose control: controlled with current regimen Mobility/therapy needs: Bedrest.  Daily labs: as per ECMO protocol. Code Status: Full code  Disposition: ICU Family: Updated wife via phone  Labs   CBC: CBC Latest Ref Rng & Units 10/09/2019 10/09/2019 10/09/2019  WBC 4.0 - 10.5 K/uL - - 9.4  Hemoglobin 13.0 - 17.0 g/dL 7.8(L) 9.2(L) 8.3(L)  Hematocrit 39.0 - 52.0 % 23.0(L) 27.0(L) 29.0(L)  Platelets 150 - 400 K/uL - - 115(L)     Basic Metabolic Panel: BMP Latest Ref Rng & Units 10/09/2019 10/09/2019 10/09/2019  Glucose 70 - 99 mg/dL - - 109(H)  BUN 6 - 20 mg/dL - - 66(H)  Creatinine 0.61 - 1.24 mg/dL - - 0.60(L)  Sodium 135 - 145 mmol/L 145 144 142  Potassium 3.5 - 5.1 mmol/L 4.0 4.6 4.5  Chloride 98 - 111 mmol/L - - 101  CO2 22 - 32 mmol/L - - 33(H)  Calcium 8.9 - 10.3 mg/dL - - 8.6(L)     GFR: Estimated Creatinine Clearance: 155.9 mL/min (A) (by C-G formula based on SCr of 0.6 mg/dL (L)). Recent Labs  Lab 10/06/19 0353 10/06/19 1606 10/07/19 0317 10/07/19 1610 10/08/19 0357 10/08/19 1700 10/09/19 0411 10/09/19 1631  WBC 11.2*   < >  --    < > 8.5 9.5 6.9 9.4  LATICACIDVEN 1.2  --  0.7  --  0.9  --  1.1  --    < > =  values in this interval not displayed.    Liver Function Tests: Recent Labs  Lab 10/06/19 0353 10/07/19 0317  ALBUMIN 1.9* 1.9*   No results for input(s): LIPASE, AMYLASE in the last 168 hours. No results for input(s): AMMONIA in the last 168 hours.  ABG    Component Value Date/Time   PHART 7.377 10/09/2019 1846   PCO2ART 56.3 (H) 10/09/2019 1846   PO2ART 70.0 (L) 10/09/2019 1846   HCO3 33.0 (H) 10/09/2019 1846   TCO2 35 (H) 10/09/2019 1846   ACIDBASEDEF 4.0 (H) 09/16/2019 2334   O2SAT 93.0 10/09/2019 1846     Coagulation Profile: No results for input(s): INR, PROTIME in the last 168 hours.  Cardiac Enzymes: No results  for input(s): CKTOTAL, CKMB, CKMBINDEX, TROPONINI in the last 168 hours.  HbA1C: Hgb A1c MFr Bld  Date/Time Value Ref Range Status  09/23/2019 08:58 AM 5.5 4.8 - 5.6 % Final    Comment:    (NOTE) Pre diabetes:          5.7%-6.4% Diabetes:              >6.4% Glycemic control for   <7.0% adults with diabetes   07/24/2019 04:50 AM 6.7 (H) 4.8 - 5.6 % Final    Comment:    (NOTE) Pre diabetes:          5.7%-6.4% Diabetes:              >6.4% Glycemic control for   <7.0% adults with diabetes     CBG: Recent Labs  Lab 10/08/19 2317 10/09/19 0431 10/09/19 0917 10/09/19 1133 10/09/19 1641  GLUCAP 101* 78 93 139* 105*   This patient is critically ill with multiple organ system failure which requires frequent high complexity decision making, assessment, support, evaluation, and titration of therapies.  Participated in multidisciplinary ECMO rounds.  This was completed through the application of advanced monitoring technologies and extensive interpretation of multiple databases. During this encounter critical care time was devoted to patient care services described in this note for 45 minutes.  Julian Hy, DO 10/09/19 7:37 PM [facility]

## 2019-10-09 NOTE — Progress Notes (Addendum)
Sweep trial  Began sweep trial at 1140.  ABGs and VS in the chart.  TV > 400, VS stable through my handoff at 7pm.  Pt remains calm and comfortable for the most part, with only complaints of legs or bottom hurting.    Handoff given to [employer], RT ES.  Sweep plan to continue through the night as patient remains stable.  Team to eval in the am regarding decannulation if he continues the sweep trial through the night.   Karsten Ro, RN ECMO Specialist

## 2019-10-09 NOTE — Plan of Care (Signed)
  Problem: Respiratory: Goal: Complications related to the disease process, condition or treatment will be avoided or minimized Outcome: Progressing   Problem: Respiratory: Goal: Ability to maintain a clear airway and adequate ventilation will improve Outcome: Progressing   Problem: Role Relationship: Goal: Method of communication will improve Outcome: Progressing   Problem: Clinical Measurements: Goal: Ability to maintain clinical measurements within normal limits will improve Outcome: Progressing Goal: Cardiovascular complication will be avoided Outcome: Progressing   Problem: Activity: Goal: Risk for activity intolerance will decrease Outcome: Progressing   Problem: Nutrition: Goal: Adequate nutrition will be maintained Outcome: Progressing   Problem: Coping: Goal: Level of anxiety will decrease Outcome: Progressing   Problem: Elimination: Goal: Will not experience complications related to bowel motility Outcome: Progressing Goal: Will not experience complications related to urinary retention Outcome: Progressing   Problem: Pain Managment: Goal: General experience of comfort will improve Outcome: Progressing   Problem: Safety: Goal: Ability to remain free from injury will improve Outcome: Progressing   Problem: Skin Integrity: Goal: Risk for impaired skin integrity will decrease Outcome: Progressing

## 2019-10-09 NOTE — CV Procedure (Signed)
   ECMO NOTE:  Indication: Respiratory failure due to COVID PNA  Initial cannulation date: 08/03/2019  ECMO type: VV ECMO (Cardio-Help)  Dual lumen  Inflow/return cannula:  1) 32 FR Crescent placed 2/2  ECMO events:  Initial cannulation 12/26 Circuit changed and trach exchanged on 1/8.   Underwent addition of a RIJ drainage cannula on 1/20 Circuit changed on 1/22 for rising LDH Had evidence of recirculation and RIJ cannula pulled back on 1/26 19 FR RIJ removed 09/06/19 Underwent change to [facility] catheter with removal of femoral lines 2/2 2/21 2 hour sweep trial  2/22 Sedation weaned off  2/24 7 hour sweep trial  2/25 3 hours of sweep trial 2/26  2.5 hours of sweep trial  2/27 1 hour sweep trial 3/1  Off sweep x 20 hours 3/2  Aborted ECMO cannula removal   Daily data:   Flow 5.2 RPM 3575 dP 41 Pven  -73 Sweep  2 SVO2 85.5%  Labs:  7.41/59/126/99% Hgb 8.0 LDH 378 -> 383 -> 385 -> 379 -> 404 -> 409 Heparin level: 0.32 Lactic acid 1.1 BUN 66  Plan:  Failed decannulation 3/2 due to oversedation and paralytics in OR CXR worse today. Back on sweep 2.  Diurese. Repeat bronch.  Transfuse Repeat sweep trial later today  Glori Bickers, MD  8:34 AM

## 2019-10-09 NOTE — Progress Notes (Signed)
Patient is on 2 of sweep tolerating it well at this time. ABG is stable at this time.

## 2019-10-09 NOTE — Progress Notes (Signed)
ANTICOAGULATION CONSULT NOTE - Follow Up Consult  Pharmacy Consult for Heparin Indication: ECMO  Labs: Recent Labs    10/07/19 0317 10/07/19 0406 10/07/19 1621 10/07/19 1935 10/08/19 0357 10/08/19 0357 10/08/19 0421 10/08/19 0537 10/08/19 0626 10/08/19 0725 10/08/19 1700 10/08/19 1750 10/09/19 0229 10/09/19 0229 10/09/19 0411 10/09/19 0420  HGB  --    < > 8.5*   < > 7.2*  --    < >  --   --    < > 8.8*   < > 8.5*   < > 8.0* 10.2*  HCT  --    < > 25.0*   < > 26.4*  --    < >  --   --    < > 29.8*   < > 25.0*  --  27.6* 30.0*  PLT  --    < >  --    < > 108*  --   --   --   --   --  106*  --   --   --  102*  --   APTT  --    < >  --    < > >200*  --    < > 95*  --   --  79*  --   --   --  99*  --   HEPARINUNFRC  --    < >  --    < > 1.74*   < >  --   --  0.27*  --  0.22*  --   --   --  0.32  --   CREATININE 0.64  --  0.54*  --  0.57*  --   --   --   --   --   --   --   --   --   --   --    < > = values in this interval not displayed.    Assessment: 44yo male on ECMO.  Heparin level therapeutic at 0.33, on 1750 units/hr. Hgb 10 - stable after prbc 3/2, Plt stable ~ 100 (trending up from earlier slightly). No s/sx of bleeding or infusion issues per RN. Fibrinogen improved 700>300-400 stable , LDH improved stable 400  Transfuse when Hgb < 7. LDH and fibrinogen stable.   Goal of Therapy:  Heparin level 0.3-0.4 units/ml   Plan:  Continue IV heparin at 1750 units/hr. Heparin levels and CBC with routine ECMO labs. Monitor for bleeding  Bonnita Nasuti Pharm.D. CPP, BCPS Clinical Pharmacist [phone] 10/09/2019 9:08 AM

## 2019-10-09 NOTE — Procedures (Signed)
Bronchoscopy Procedure Note [name] [id number] 04-Nov-1975  Procedure: Bronchoscopy Indications: Diagnostic evaluation of the airways and Obtain specimens for culture and/or other diagnostic studies  Procedure Details Consent: Risks of procedure as well as the alternatives and risks of each were explained to the (patient/caregiver).  Consent for procedure obtained. Wife- Palo, phone consent. Time Out: Verified patient identification, verified procedure, site/side was marked, verified correct patient position, special equipment/implants available, medications/allergies/relevent history reviewed, required imaging and test results available.  Performed  In preparation for procedure, patient was given 100% FiO2, bronchoscope lubricated and lidocaine given via ETT (5 ml). Sedation: propofol bolus 68m, infusion via MAR.   Airway entered and the following bronchi were examined: RUL, RML, RLL, LUL, LLL and Bronchi.   Mucosa all normal, no significant secretions. Procedures performed: BAL performed RLL 120cc in, 20cc returned LLL 2 segments, 240cc instilled, 28cc  Bronchoscope removed.    Evaluation Hemodynamic Status: BP stable throughout; O2 sats: stable throughout Patient's Current Condition: stable Specimens:  Sent serosanguinous fluid Complications: No apparent complications Patient did tolerate procedure well.   [name]3/10/2019

## 2019-10-09 NOTE — Progress Notes (Signed)
Physical Therapy Treatment Patient Details Name: [name] MRN: [id number] DOB: Mar 18, 1976 Today's Date: 10/09/2019    History of Present Illness 44 y/o M admitted 12/14 with COVID pneumonia causing acute hypoxemic respiratory failure.   Developed pneumomediastinum 12/23 with concern for possible bilateral small pneumothoraces on 12/24. Pt intubated on 12/26 and started on ECMO on 12/27. Tracheostomy on 12/29 with bleeding from trach an dother sites on 12/31. Bronch on 1/11 and 1/15. Slowly weaning sedation 2/5. Continuing to wean sedation and starting sweep trials to wean ECMO    PT Comments    Pt tolerated treatment well demonstrating improved activation of all extremities. Pt following commands more consistently and responding appropriately with head nods during session. Pt continues to require significant assistance to perform all functional mobility, and requires AAROM for most functional ROM of extremities. Pt AROM in UEs >LEs at this time. Pt will continue to benefit from PT POC to improve strength and ROM, resulting in reduced caregiver burden. PT encourages pt to sit in bed in chair mode multiple times per day with assistance of nursing staff.   Follow Up Recommendations  LTACH     Equipment Recommendations  Other (comment)    Recommendations for Other Services       Precautions / Restrictions Precautions Precautions: Fall;Other (comment) Precaution Comments: ECMO R IJ, Vent to trach, G tube, necrosis bil. feet  Required Braces or Orthoses: Other Brace Other Brace: B Prevalon Restrictions Weight Bearing Restrictions: No    Mobility  Bed Mobility Overal bed mobility: Needs Assistance             General bed mobility comments: maxA of 2 to pull forward into unsupported sitting from bed in chair mode. Pt performs 10 repetitions of this during session. Pt pulling/pushing from side to side with PT cues and mod-maxA  Transfers                     Ambulation/Gait                 Stairs             Wheelchair Mobility    Modified Rankin (Stroke Patients Only)       Balance                                            Cognition Arousal/Alertness: Awake/alert Behavior During Therapy: Flat affect Overall Cognitive Status: Difficult to assess Area of Impairment: Attention;Following commands;Safety/judgement;Awareness;Problem solving                   Current Attention Level: Sustained   Following Commands: Follows one step commands inconsistently Safety/Judgement: Decreased awareness of safety;Decreased awareness of deficits Awareness: Intellectual Problem Solving: Slow processing;Decreased initiation;Difficulty sequencing;Requires verbal cues;Requires tactile cues        Exercises Other Exercises Other Exercises: shoulder flexion AAROM 10 reps bilaterally Other Exercises: Scapular protraction/retraction x10 reps AAROM bilaterally Other Exercises: PROM finger extension and flexion x5 reps bilaterally Other Exercises: PROM knee extension x5 reps bilaterally    General Comments General comments (skin integrity, edema, etc.): pt on trach to vent 40% FiO2, 10 PEEP, 20 RR. initial vitals of 90HR, 98% SpO2, 169/75. End vitals 97HR, 97% SpO2, 166/78. Pt VSS throughout session, RN and ECMO specialist present to manage lines      Pertinent Vitals/Pain Pain Assessment: Faces Faces Pain  Scale: Hurts even more Pain Location: with R shoulder mobility intermittently Pain Descriptors / Indicators: Grimacing Pain Intervention(s): Limited activity within patient's tolerance    Home Living                      Prior Function            PT Goals (current goals can now be found in the care plan section) Acute Rehab PT Goals Patient Stated Goal: unable to state Progress towards PT goals: Progressing toward goals(slowly)    Frequency    Min 3X/week      PT Plan Current  plan remains appropriate;Frequency needs to be updated    Co-evaluation PT/OT/SLP Co-Evaluation/Treatment: Yes Reason for Co-Treatment: Complexity of the patient's impairments (multi-system involvement);Necessary to address cognition/behavior during functional activity;For patient/therapist safety;To address functional/ADL transfers PT goals addressed during session: Mobility/safety with mobility;Balance;Proper use of DME;Strengthening/ROM        AM-PAC PT "6 Clicks" Mobility   Outcome Measure  Help needed turning from your back to your side while in a flat bed without using bedrails?: Total Help needed moving from lying on your back to sitting on the side of a flat bed without using bedrails?: Total Help needed moving to and from a bed to a chair (including a wheelchair)?: Total Help needed standing up from a chair using your arms (e.g., wheelchair or bedside chair)?: Total Help needed to walk in hospital room?: Total Help needed climbing 3-5 steps with a railing? : Total 6 Click Score: 6    End of Session Equipment Utilized During Treatment: Oxygen Activity Tolerance: Patient tolerated treatment well Patient left: in bed;with call bell/phone within reach;with nursing/sitter in room Nurse Communication: Mobility status PT Visit Diagnosis: Muscle weakness (generalized) (M62.81);Other symptoms and signs involving the nervous system (R29.898);Other abnormalities of gait and mobility (R26.89)     Time: 5997-7414 PT Time Calculation (min) (ACUTE ONLY): 31 min  Charges:  $Therapeutic Activity: 8-22 mins                     Zenaida Niece, PT, DPT Acute Rehabilitation Pager: [phone]    Zenaida Niece 10/09/2019, 5:10 PM

## 2019-10-10 ENCOUNTER — Inpatient Hospital Stay (HOSPITAL_COMMUNITY): Payer: Managed Care, Other (non HMO)

## 2019-10-10 LAB — POCT I-STAT 7, (LYTES, BLD GAS, ICA,H+H)
Acid-Base Excess: 11 mmol/L — ABNORMAL HIGH (ref 0.0–2.0)
Acid-Base Excess: 11 mmol/L — ABNORMAL HIGH (ref 0.0–2.0)
Acid-Base Excess: 11 mmol/L — ABNORMAL HIGH (ref 0.0–2.0)
Acid-Base Excess: 12 mmol/L — ABNORMAL HIGH (ref 0.0–2.0)
Acid-Base Excess: 13 mmol/L — ABNORMAL HIGH (ref 0.0–2.0)
Acid-Base Excess: 13 mmol/L — ABNORMAL HIGH (ref 0.0–2.0)
Acid-Base Excess: 14 mmol/L — ABNORMAL HIGH (ref 0.0–2.0)
Bicarbonate: 37 mmol/L — ABNORMAL HIGH (ref 20.0–28.0)
Bicarbonate: 37.7 mmol/L — ABNORMAL HIGH (ref 20.0–28.0)
Bicarbonate: 38 mmol/L — ABNORMAL HIGH (ref 20.0–28.0)
Bicarbonate: 38.1 mmol/L — ABNORMAL HIGH (ref 20.0–28.0)
Bicarbonate: 39.2 mmol/L — ABNORMAL HIGH (ref 20.0–28.0)
Bicarbonate: 39.4 mmol/L — ABNORMAL HIGH (ref 20.0–28.0)
Bicarbonate: 41.1 mmol/L — ABNORMAL HIGH (ref 20.0–28.0)
Calcium, Ion: 1.23 mmol/L (ref 1.15–1.40)
Calcium, Ion: 1.24 mmol/L (ref 1.15–1.40)
Calcium, Ion: 1.24 mmol/L (ref 1.15–1.40)
Calcium, Ion: 1.25 mmol/L (ref 1.15–1.40)
Calcium, Ion: 1.27 mmol/L (ref 1.15–1.40)
Calcium, Ion: 1.27 mmol/L (ref 1.15–1.40)
Calcium, Ion: 1.27 mmol/L (ref 1.15–1.40)
HCT: 23 % — ABNORMAL LOW (ref 39.0–52.0)
HCT: 24 % — ABNORMAL LOW (ref 39.0–52.0)
HCT: 24 % — ABNORMAL LOW (ref 39.0–52.0)
HCT: 25 % — ABNORMAL LOW (ref 39.0–52.0)
HCT: 26 % — ABNORMAL LOW (ref 39.0–52.0)
HCT: 28 % — ABNORMAL LOW (ref 39.0–52.0)
HCT: 29 % — ABNORMAL LOW (ref 39.0–52.0)
Hemoglobin: 7.8 g/dL — ABNORMAL LOW (ref 13.0–17.0)
Hemoglobin: 8.2 g/dL — ABNORMAL LOW (ref 13.0–17.0)
Hemoglobin: 8.2 g/dL — ABNORMAL LOW (ref 13.0–17.0)
Hemoglobin: 8.5 g/dL — ABNORMAL LOW (ref 13.0–17.0)
Hemoglobin: 8.8 g/dL — ABNORMAL LOW (ref 13.0–17.0)
Hemoglobin: 9.5 g/dL — ABNORMAL LOW (ref 13.0–17.0)
Hemoglobin: 9.9 g/dL — ABNORMAL LOW (ref 13.0–17.0)
O2 Saturation: 94 %
O2 Saturation: 94 %
O2 Saturation: 94 %
O2 Saturation: 95 %
O2 Saturation: 96 %
O2 Saturation: 96 %
O2 Saturation: 97 %
Patient temperature: 37.1
Patient temperature: 37.2
Patient temperature: 98.5
Patient temperature: 98.8
Patient temperature: 98.8
Patient temperature: 99
Patient temperature: 99.2
Potassium: 3.9 mmol/L (ref 3.5–5.1)
Potassium: 4.1 mmol/L (ref 3.5–5.1)
Potassium: 4.1 mmol/L (ref 3.5–5.1)
Potassium: 4.3 mmol/L (ref 3.5–5.1)
Potassium: 4.3 mmol/L (ref 3.5–5.1)
Potassium: 4.5 mmol/L (ref 3.5–5.1)
Potassium: 4.6 mmol/L (ref 3.5–5.1)
Sodium: 143 mmol/L (ref 135–145)
Sodium: 144 mmol/L (ref 135–145)
Sodium: 144 mmol/L (ref 135–145)
Sodium: 144 mmol/L (ref 135–145)
Sodium: 144 mmol/L (ref 135–145)
Sodium: 145 mmol/L (ref 135–145)
Sodium: 145 mmol/L (ref 135–145)
TCO2: 39 mmol/L — ABNORMAL HIGH (ref 22–32)
TCO2: 40 mmol/L — ABNORMAL HIGH (ref 22–32)
TCO2: 40 mmol/L — ABNORMAL HIGH (ref 22–32)
TCO2: 40 mmol/L — ABNORMAL HIGH (ref 22–32)
TCO2: 41 mmol/L — ABNORMAL HIGH (ref 22–32)
TCO2: 41 mmol/L — ABNORMAL HIGH (ref 22–32)
TCO2: 43 mmol/L — ABNORMAL HIGH (ref 22–32)
pCO2 arterial: 57.1 mmHg — ABNORMAL HIGH (ref 32.0–48.0)
pCO2 arterial: 57.3 mmHg — ABNORMAL HIGH (ref 32.0–48.0)
pCO2 arterial: 59.8 mmHg — ABNORMAL HIGH (ref 32.0–48.0)
pCO2 arterial: 61.1 mmHg — ABNORMAL HIGH (ref 32.0–48.0)
pCO2 arterial: 63.7 mmHg — ABNORMAL HIGH (ref 32.0–48.0)
pCO2 arterial: 64.5 mmHg — ABNORMAL HIGH (ref 32.0–48.0)
pCO2 arterial: 67.1 mmHg (ref 32.0–48.0)
pH, Arterial: 7.376 (ref 7.350–7.450)
pH, Arterial: 7.384 (ref 7.350–7.450)
pH, Arterial: 7.396 (ref 7.350–7.450)
pH, Arterial: 7.403 (ref 7.350–7.450)
pH, Arterial: 7.42 (ref 7.350–7.450)
pH, Arterial: 7.426 (ref 7.350–7.450)
pH, Arterial: 7.443 (ref 7.350–7.450)
pO2, Arterial: 71 mmHg — ABNORMAL LOW (ref 83.0–108.0)
pO2, Arterial: 74 mmHg — ABNORMAL LOW (ref 83.0–108.0)
pO2, Arterial: 76 mmHg — ABNORMAL LOW (ref 83.0–108.0)
pO2, Arterial: 77 mmHg — ABNORMAL LOW (ref 83.0–108.0)
pO2, Arterial: 84 mmHg (ref 83.0–108.0)
pO2, Arterial: 86 mmHg (ref 83.0–108.0)
pO2, Arterial: 94 mmHg (ref 83.0–108.0)

## 2019-10-10 LAB — TESTOSTERONE,FREE AND TOTAL
Testosterone, Free: 8.7 pg/mL (ref 6.8–21.5)
Testosterone: 240 ng/dL — ABNORMAL LOW (ref 264–916)

## 2019-10-10 LAB — APTT: aPTT: 78 seconds — ABNORMAL HIGH (ref 24–36)

## 2019-10-10 LAB — COOXEMETRY PANEL
Carboxyhemoglobin: 3.5 % — ABNORMAL HIGH (ref 0.5–1.5)
Methemoglobin: 1.1 % (ref 0.0–1.5)
O2 Saturation: 80.3 %
Total hemoglobin: 7.4 g/dL — ABNORMAL LOW (ref 12.0–16.0)

## 2019-10-10 LAB — BASIC METABOLIC PANEL
Anion gap: 9 (ref 5–15)
BUN: 52 mg/dL — ABNORMAL HIGH (ref 6–20)
CO2: 33 mmol/L — ABNORMAL HIGH (ref 22–32)
Calcium: 8.6 mg/dL — ABNORMAL LOW (ref 8.9–10.3)
Chloride: 102 mmol/L (ref 98–111)
Creatinine, Ser: 0.66 mg/dL (ref 0.61–1.24)
GFR calc Af Amer: >60 mL/min (ref >60)
GFR calc non Af Amer: >60 mL/min (ref >60)
Glucose, Bld: 175 mg/dL — ABNORMAL HIGH (ref 70–99)
Potassium: 4 mmol/L (ref 3.5–5.1)
Sodium: 144 mmol/L (ref 135–145)

## 2019-10-10 LAB — RENAL FUNCTION PANEL
Albumin: 1.8 g/dL — ABNORMAL LOW (ref 3.5–5.0)
Anion gap: 9 (ref 5–15)
BUN: 60 mg/dL — ABNORMAL HIGH (ref 6–20)
CO2: 33 mmol/L — ABNORMAL HIGH (ref 22–32)
Calcium: 8.6 mg/dL — ABNORMAL LOW (ref 8.9–10.3)
Chloride: 102 mmol/L (ref 98–111)
Creatinine, Ser: 0.61 mg/dL (ref 0.61–1.24)
GFR calc Af Amer: >60 mL/min (ref >60)
GFR calc non Af Amer: >60 mL/min (ref >60)
Glucose, Bld: 94 mg/dL (ref 70–99)
Phosphorus: 4 mg/dL (ref 2.5–4.6)
Potassium: 4.4 mmol/L (ref 3.5–5.1)
Sodium: 144 mmol/L (ref 135–145)

## 2019-10-10 LAB — CBC
HCT: 26.6 % — ABNORMAL LOW (ref 39.0–52.0)
HCT: 30.6 % — ABNORMAL LOW (ref 39.0–52.0)
Hemoglobin: 7.7 g/dL — ABNORMAL LOW (ref 13.0–17.0)
Hemoglobin: 9.1 g/dL — ABNORMAL LOW (ref 13.0–17.0)
MCH: 28.3 pg (ref 26.0–34.0)
MCH: 28.6 pg (ref 26.0–34.0)
MCHC: 28.9 g/dL — ABNORMAL LOW (ref 30.0–36.0)
MCHC: 29.7 g/dL — ABNORMAL LOW (ref 30.0–36.0)
MCV: 97.8 fL (ref 80.0–100.0)
MCV: 96.2 fL (ref 80.0–100.0)
Platelets: 98 10*3/uL — ABNORMAL LOW (ref 150–400)
Platelets: 120 10*3/uL — ABNORMAL LOW (ref 150–400)
RBC: 2.72 MIL/uL — ABNORMAL LOW (ref 4.22–5.81)
RBC: 3.18 MIL/uL — ABNORMAL LOW (ref 4.22–5.81)
RDW: 18 % — ABNORMAL HIGH (ref 11.5–15.5)
RDW: 18.2 % — ABNORMAL HIGH (ref 11.5–15.5)
WBC: 6.2 10*3/uL (ref 4.0–10.5)
WBC: 9.4 10*3/uL (ref 4.0–10.5)
nRBC: 0 % (ref 0.0–0.2)
nRBC: 0 % (ref 0.0–0.2)

## 2019-10-10 LAB — LACTIC ACID, PLASMA: Lactic Acid, Venous: 0.9 mmol/L (ref 0.5–1.9)

## 2019-10-10 LAB — GLUCOSE, CAPILLARY
Glucose-Capillary: 108 mg/dL — ABNORMAL HIGH (ref 70–99)
Glucose-Capillary: 115 mg/dL — ABNORMAL HIGH (ref 70–99)
Glucose-Capillary: 122 mg/dL — ABNORMAL HIGH (ref 70–99)
Glucose-Capillary: 72 mg/dL (ref 70–99)
Glucose-Capillary: 84 mg/dL (ref 70–99)
Glucose-Capillary: 92 mg/dL (ref 70–99)
Glucose-Capillary: 95 mg/dL (ref 70–99)

## 2019-10-10 LAB — MAGNESIUM: Magnesium: 1.8 mg/dL (ref 1.7–2.4)

## 2019-10-10 LAB — LACTATE DEHYDROGENASE: LDH: 400 U/L — ABNORMAL HIGH (ref 98–192)

## 2019-10-10 LAB — FIBRINOGEN: Fibrinogen: 267 mg/dL (ref 210–475)

## 2019-10-10 LAB — HEPARIN LEVEL (UNFRACTIONATED): Heparin Unfractionated: 0.27 IU/mL — ABNORMAL LOW (ref 0.30–0.70)

## 2019-10-10 LAB — PREPARE RBC (CROSSMATCH)

## 2019-10-10 MED ORDER — MAGNESIUM SULFATE 4 GM/100ML IV SOLN
4.0000 g | Freq: Once | INTRAVENOUS | Status: AC
  Administered 2019-10-10: 4 g via INTRAVENOUS
  Filled 2019-10-10: qty 100

## 2019-10-10 MED ORDER — MIDAZOLAM HCL 2 MG/2ML IJ SOLN
2.0000 mg | Freq: Once | INTRAMUSCULAR | Status: AC
  Administered 2019-10-10: 2 mg via INTRAVENOUS

## 2019-10-10 MED ORDER — CHLORHEXIDINE GLUCONATE 0.12 % MT SOLN
OROMUCOSAL | Status: AC
  Filled 2019-10-10: qty 15

## 2019-10-10 MED ORDER — METOLAZONE 2.5 MG PO TABS
2.5000 mg | ORAL_TABLET | Freq: Once | ORAL | Status: AC
  Administered 2019-10-10: 2.5 mg via ORAL
  Filled 2019-10-10: qty 1

## 2019-10-10 MED ORDER — FUROSEMIDE 10 MG/ML IJ SOLN
40.0000 mg | Freq: Once | INTRAMUSCULAR | Status: AC
  Administered 2019-10-10: 40 mg via INTRAVENOUS
  Filled 2019-10-10: qty 4

## 2019-10-10 MED ORDER — LIDOCAINE 2% (20 MG/ML) 5 ML SYRINGE
Freq: Once | INTRAMUSCULAR | Status: DC
  Filled 2019-10-10: qty 5

## 2019-10-10 MED ORDER — IOHEXOL 300 MG/ML  SOLN
25.0000 mL | Freq: Once | INTRAMUSCULAR | Status: AC | PRN
  Administered 2019-10-10: 25 mL

## 2019-10-10 MED ORDER — LIDOCAINE-EPINEPHRINE 2 %-1:100000 IJ SOLN
20.0000 mL | Freq: Once | INTRAMUSCULAR | Status: AC
  Administered 2019-10-10: 20 mL via INTRADERMAL
  Filled 2019-10-10: qty 20

## 2019-10-10 MED ORDER — DIATRIZOATE MEGLUMINE & SODIUM 66-10 % PO SOLN
30.0000 mL | Freq: Once | ORAL | Status: DC
  Filled 2019-10-10: qty 30

## 2019-10-10 MED ORDER — SODIUM CHLORIDE 0.9% IV SOLUTION: Freq: Once | INTRAVENOUS | Status: AC

## 2019-10-10 NOTE — Consult Note (Signed)
[facility] Nurse wound follow up Wound type: Deep tissue pressure Injury (DTPI) to bilateral lateral feet. Stable dry blood blisters with decreasing elevation.  Minor seepage from most dependent area at posterior heel as blood breaks down and hemolyzes. Eschars are drying and stabilizing. Measurement: Right:20cm x 6cm Left: 20cm x 5cm Wound bed: See above Drainage (amount, consistency, odor) As above Periwound: intact, previously noted moisture associated skin damage between 4th and 5th digits has resolved, but treatment with silver hydrofiber continues as a preventive measure. Dressing procedure/placement/frequency: I am assisted with my assessment today by the patient's bedside RN, Sondra and WTA-C  P. Armstrong.  Treatment will continue as previously directed, using a betadine swabstick to act as both an astringent and an antimicrobial. Same applied generously to both eschars and allowed to air dry.  Silicone foam dressings applied to heels and feet placed into Prevalon boots.  Posterior neck area of tissue loss has healed and reepithelialized.  Due to the thin layer of epithelial tissue and decreased tensile strength, the care team collectively agrees to place a prophylactic silicone dressing over the area for another 12-14 days. The area remains moist and with the trach securement device there is still a potential for friction.  [facility] nursing team will follow, seeing this patient every 7-10 days and will remain available to this patient, the nursing and medical teams.   Thanks, Maudie Flakes, MSN, RN, [place], Arther Abbott  Pager# [phone]

## 2019-10-10 NOTE — Progress Notes (Addendum)
Pt sedated with 2 mg of Versed.Precedex increased to 1.2 per physician order.Emergency blood and medication at beside. Sterile field conducted and maintained. RPMs decreases to 0 and arterial line/ venous line clamped. Dr. Orvan Seen and Dr. Haroldine Laws strategically clamped closer to insertion sight and removed R IJ cannula. Pressure held at sight by Dr. Haroldine Laws for minimum 25 min. Bedside nurse continued pressure hold for 15, min. Cardiohelp disassembled and sanitized. 1 unit of blood given.

## 2019-10-10 NOTE — CV Procedure (Signed)
   ECMO NOTE:  Indication: Respiratory failure due to COVID PNA  Initial cannulation date: 08/03/2019  ECMO type: VV ECMO (Cardio-Help)  Dual lumen  Inflow/return cannula:  1) 32 FR Crescent placed 2/2  ECMO events:  Initial cannulation 12/26 Circuit changed and trach exchanged on 1/8.   Underwent addition of a RIJ drainage cannula on 1/20 Circuit changed on 1/22 for rising LDH Had evidence of recirculation and RIJ cannula pulled back on 1/26 19 FR RIJ removed 09/06/19 Underwent change to [facility] catheter with removal of femoral lines 2/2 2/21 2 hour sweep trial  2/22 Sedation weaned off  2/24 7 hour sweep trial  2/25 3 hours of sweep trial 2/26  2.5 hours of sweep trial  2/27 1 hour sweep trial 3/1  Off sweep x 20 hours 3/2  Aborted ECMO cannula removal  3/4 Off sweep for almost 24 hours   Daily data:   Flow 5.27 RPM 3600 dP 43 Pven  -83 Sweep  0 SVO2 73.2%  Labs:  7.40/61/94/97% Hgb 7.7 LDH 378 -> 383 -> 385 -> 379 -> 404 -> 409 -> 400 Heparin level: 0.27 Lactic acid 0.9 BUN 60  Plan:  Failed decannulation 3/2 due to oversedation and paralytics in OR Bronch yesterday looked good. Off sweep gas almost 24 hours.  Volume overloaded.   Give lasix and metolazone this am. Transfuse 1u Pull cannula later today.    Glori Bickers, MD  8:34 AM

## 2019-10-10 NOTE — Progress Notes (Signed)
2 Days Post-Op Procedure(s) (LRB): Cancelled Procedure - Patient not physiologically ready (N/A) Subjective: No complaints  Objective: Vital signs in last 24 hours: Temp:  [97 F (36.1 C)-99.3 F (37.4 C)] 97 F (36.1 C) (03/04 1425) Pulse Rate:  [62-96] 84 (03/04 1425) Cardiac Rhythm: Normal sinus rhythm (03/04 1215) Resp:  [17-31] 21 (03/04 1425) BP: (112-182)/(54-76) 143/54 (03/04 1425) SpO2:  [92 %-100 %] 98 % (03/04 1425) Arterial Line BP: (99-285)/(46-271) 142/52 (03/04 1425) FiO2 (%):  [40 %] 40 % (03/04 1215) Weight:  [135.2 kg] 135.2 kg (03/04 0500)  Hemodynamic parameters for last 24 hours: CVP:  [2 mmHg-16 mmHg] 6 mmHg  Intake/Output from previous day: 03/03 0701 - 03/04 0700 In: 3638.9 [I.V.:1057.9; NG/GT:1311] Out: 3953 [Urine:3575] Intake/Output this shift: Total I/O In: 562.7 [I.V.:175.8; Other:50; NG/GT:237; IV Piggyback:99.9] Out: 2000 [Urine:2000]  PE: chronically ill appearing Exam nonfocal Right neck cleansed and draped sterilely. Ecmo cannula removed with local control of bleeding. Tolerated well.  Lab Results: Recent Labs    10/09/19 1631 10/09/19 1641 10/10/19 0328 10/10/19 0548 10/10/19 0817 10/10/19 1217  WBC 9.4  --  6.2  --   --   --   HGB 8.3*   < > 7.7*   < > 8.5* 8.8*  HCT 29.0*   < > 26.6*   < > 25.0* 26.0*  PLT 115*  --  98*  --   --   --    < > = values in this interval not displayed.   BMET:  Recent Labs    10/09/19 1631 10/09/19 1641 10/10/19 0328 10/10/19 0548 10/10/19 0817 10/10/19 1217  NA 142   < > 144   < > 144 144  K 4.5   < > 4.4   < > 4.6 4.1  CL 101  --  102  --   --   --   CO2 33*  --  33*  --   --   --   GLUCOSE 109*  --  94  --   --   --   BUN 66*  --  60*  --   --   --   CREATININE 0.60*  --  0.61  --   --   --   CALCIUM 8.6*  --  8.6*  --   --   --    < > = values in this interval not displayed.    PT/INR: No results for input(s): LABPROT, INR in the last 72 hours. ABG    Component Value  Date/Time   PHART 7.426 10/10/2019 1217   HCO3 39.4 (H) 10/10/2019 1217   TCO2 41 (H) 10/10/2019 1217   ACIDBASEDEF 4.0 (H) 09/16/2019 2334   O2SAT 94.0 10/10/2019 1217   CBG (last 3)  Recent Labs    10/10/19 0313 10/10/19 0816 10/10/19 1217  GLUCAP 95 72 122*    Assessment/Plan: S/P Procedure(s) (LRB): Cancelled Procedure - Patient not physiologically ready (N/A) Mobilize Diuresis off ECMO support--f/u respiratory gases.    LOS: 80 days    [name] 10/10/2019

## 2019-10-10 NOTE — Progress Notes (Signed)
ECMO Specialist Note:  Patient remains on sweep trial. Last ABG this morning is as follows:   10/10/2019 05:48  pH, Arterial 7.403  pCO2 arterial 61.1 (H)  pO2, Arterial 94.0  TCO2 40 (H)  Acid-Base Excess 12.0 (H)  Bicarbonate 38.1 (H)  O2 Saturation 97.0  Patient temperature 37.1 C   Vital signs are WNL and have been stable throughout the night. Patient is calm at this time but complains of persistent knee pain. Team to evaluate this AM in regards to decannulation.  Carrington Clamp RRT ECMO Specialist

## 2019-10-10 NOTE — Progress Notes (Signed)
ANTICOAGULATION CONSULT NOTE - Follow Up Consult  Pharmacy Consult for Heparin Indication: ECMO  Labs: Recent Labs    10/09/19 0411 10/09/19 0420 10/09/19 0919 10/09/19 1133 10/09/19 1631 10/09/19 1641 10/10/19 0328 10/10/19 0328 10/10/19 0548 10/10/19 0548 10/10/19 0817 10/10/19 1217  HGB 8.0*   < >  --    < > 8.3*   < > 7.7*   < > 8.2*   < > 8.5* 8.8*  HCT 27.6*   < >  --    < > 29.0*   < > 26.6*   < > 24.0*  --  25.0* 26.0*  PLT 102*  --   --   --  115*  --  98*  --   --   --   --   --   APTT 99*  --   --   --  68*  --  78*  --   --   --   --   --   HEPARINUNFRC 0.32  --   --   --  0.33  --  0.27*  --   --   --   --   --   CREATININE  --   --  0.61  --  0.60*  --  0.61  --   --   --   --   --    < > = values in this interval not displayed.    Assessment: 44yo male on ECMO.  Heparin level therapeutic at 0.27 units/mL on heparin drip 1750 uts/hr. CBC stable  No bleeding reported.   Goal of Therapy:  Heparin level 0.3-0.4 units/ml   Plan:  Continue IV heparin at 1750 units/hr - holding at noon for possible decannulation today - if not then f/u restart Heparin levels and CBC with routine ECMO labs. Monitor for bleeding   Bonnita Nasuti Pharm.D. CPP, BCPS Clinical Pharmacist [phone] 10/10/2019 12:58 PM

## 2019-10-10 NOTE — Progress Notes (Signed)
Patient ID: [name], male   DOB: Dec 31, 1975, 44 y.o.   MRN: [id number]    Advanced Heart Failure Rounding Note   Subjective:    Failed decannulation 3/2 due to oversedation and paralytics in OR.   Off sweep x ~ 24 hours. Gases ok.   Awake on vent. Bronch yesterday looked great.  7.40/61/94/97% Hgb 7.7 LDH 378 -> 383 -> 385 -> 379 -> 404 -> 409 -> 400 Heparin level: 0.27 Lactic acid 0.9 BUN 60   ECMO parameters - see separate ecmo note   Objective:   Weight Range:  Vital Signs:   Temp:  [98.7 F (37.1 C)-99.3 F (37.4 C)] 98.8 F (37.1 C) (03/04 0400) Pulse Rate:  [62-96] 75 (03/04 0741) Resp:  [17-31] 24 (03/04 0741) BP: (116-182)/(56-76) 151/64 (03/04 0741) SpO2:  [92 %-100 %] 100 % (03/04 0741) Arterial Line BP: (114-182)/(47-84) 144/62 (03/04 0600) FiO2 (%):  [40 %] 40 % (03/04 0741) Weight:  [135.2 kg] 135.2 kg (03/04 0500) Last BM Date: 10/09/19  Weight change: Filed Weights   10/08/19 0500 10/09/19 0500 10/10/19 0500  Weight: 133 kg 133.7 kg 135.2 kg    Intake/Output:   Intake/Output Summary (Last 24 hours) at 10/10/2019 0829 Last data filed at 10/10/2019 0600 Gross per 24 hour  Intake 3575.12 ml  Output 3575 ml  Net 0.12 ml     Physical Exam : General:  Awake on vent/ecmo HEENT: normal Neck: supple. + trach + ecmo cannula Carotids 2+ bilat; no bruits. No lymphadenopathy or thryomegaly appreciated. Cor: PMI nondisplaced. Regular rate & rhythm. No rubs, gallops or murmurs. Lungs: clear Abdomen: obese soft, nontender, nondistended. No hepatosplenomegaly. No bruits or masses. Good bowel sounds. + PEG Extremities: no cyanosis, clubbing, rash, 2+ edema DTIs on feet Neuro: awake on vent following commands  Telemetry: Sinus 70-80s Personally reviewed   Labs: Basic Metabolic Panel: Recent Labs  Lab 10/06/19 0353 10/06/19 0353 10/06/19 0407 10/07/19 8453 10/07/19 0317 10/07/19 0406 10/07/19 1621 10/07/19 1935 10/08/19 0357  10/08/19 0421 10/09/19 0411 10/09/19 0420 10/09/19 0919 10/09/19 1133 10/09/19 1631 10/09/19 1641 10/09/19 2209 10/10/19 0010 10/10/19 0312 10/10/19 0328 10/10/19 0548  NA 144   < >   < >  --  147*   < > 145  145   < > 146*   < >  --    < > 143   < > 142   < > 144 145 144 144 143  K 4.5   < >   < >  --  4.7   < > 4.7  4.9   < > 4.9   < >  --    < > 4.6   < > 4.5   < > 4.4 4.5 4.3 4.4 4.3  CL 105   < >  --   --  106   < > 107  --  105  --   --   --  101  --  101  --   --   --   --  102  --   CO2 32   < >  --   --  34*   < > 34*  --  33*  --   --   --  33*  --  33*  --   --   --   --  33*  --   GLUCOSE 74   < >  --   --  74   < >  75  --  87  --   --   --  93  --  109*  --   --   --   --  94  --   BUN 70*   < >  --   --  68*   < > 69*  --  66*  --   --   --  61*  --  66*  --   --   --   --  60*  --   CREATININE 0.63   < >  --   --  0.64   < > 0.54*  --  0.57*  --   --   --  0.61  --  0.60*  --   --   --   --  0.61  --   CALCIUM 8.7*   < >  --   --  8.9   < > 8.3*   < > 8.5*   < >  --   --  8.6*  --  8.6*  --   --   --   --  8.6*  --   MG 2.0  --   --  1.9  --   --   --   --  2.2  --  2.0  --   --   --   --   --   --   --   --  1.8  --   PHOS 3.0  3.3  --    < > 3.6 3.6  --   --   --  3.6  --  3.9  --   --   --   --   --   --   --   --  4.0  --    < > = values in this interval not displayed.    Liver Function Tests: Recent Labs  Lab 10/06/19 0353 10/07/19 0317 10/10/19 0328  ALBUMIN 1.9* 1.9* 1.8*   No results for input(s): LIPASE, AMYLASE in the last 168 hours. No results for input(s): AMMONIA in the last 168 hours.  CBC: Recent Labs  Lab 10/08/19 0357 10/08/19 0421 10/08/19 1700 10/08/19 1750 10/09/19 0411 10/09/19 0420 10/09/19 1631 10/09/19 1641 10/09/19 2209 10/10/19 0010 10/10/19 0312 10/10/19 0328 10/10/19 0548  WBC 8.5  --  9.5  --  6.9  --  9.4  --   --   --   --  6.2  --   HGB 7.2*   < > 8.8*   < > 8.0*   < > 8.3*   < > 8.5* 7.8* 8.2* 7.7* 8.2*  HCT  26.4*   < > 29.8*   < > 27.6*   < > 29.0*   < > 25.0* 23.0* 24.0* 26.6* 24.0*  MCV 99.2  --  95.8  --  97.5  --  97.0  --   --   --   --  97.8  --   PLT 108*  --  106*  --  102*  --  115*  --   --   --   --  98*  --    < > = values in this interval not displayed.    Cardiac Enzymes: No results for input(s): CKTOTAL, CKMB, CKMBINDEX, TROPONINI in the last 168 hours.  BNP: BNP (last 3 results) Recent Labs    07/22/19 1039  BNP 15.3    ProBNP (last 3 results) No results for input(s): PROBNP in  the last 8760 hours.    Other results:  Imaging: DG Chest 1 View  Result Date: 10/09/2019 CLINICAL DATA:  Lurline Idol, on ECMO EXAM: CHEST  1 VIEW COMPARISON:  10/08/2019 FINDINGS: Support devices are unchanged. Low lung volumes. Patchy bilateral airspace disease again noted, unchanged. Cardiomegaly. No effusions or no acute bony abnormality. IMPRESSION: Low lung volumes with patchy bilateral airspace disease, stable. Cardiomegaly. Electronically Signed   By: Rolm Baptise M.D.   On: 10/09/2019 09:25   DG C-Arm 1-60 Min-No Report  Result Date: 10/08/2019 Fluoroscopy was utilized by the requesting physician.  No radiographic interpretation.     Medications:     Scheduled Medications: . sodium chloride   Intravenous Once  . sodium chloride   Intravenous Once  . acetaminophen (TYLENOL) oral liquid 160 mg/5 mL  650 mg Per Tube Q6H  . amiodarone  200 mg Per Tube Daily  . amLODipine  10 mg Per Tube Daily  . vitamin C  500 mg Per Tube Daily  . chlorhexidine gluconate (MEDLINE KIT)  15 mL Mouth Rinse BID  . Chlorhexidine Gluconate Cloth  6 each Topical Daily  . cloNIDine  0.2 mg Per Tube Q6H  . feeding supplement (OSMOLITE 1.5 CAL)  237 mL Per Tube 6 X Daily  . feeding supplement (PRO-STAT SUGAR FREE 64)  60 mL Per Tube QID  . free water  300 mL Per Tube Q6H  . furosemide  40 mg Intravenous Once  . Gerhardt's butt cream   Topical QID  . hydrALAZINE  100 mg Per Tube Q8H  . insulin aspart  0-20  Units Subcutaneous Q4H  . insulin aspart  5 Units Subcutaneous Q4H  . insulin detemir  12 Units Subcutaneous BID  . ipratropium-albuterol  3 mL Nebulization Q4H  . magic mouthwash  5 mL Oral TID  . mouth rinse  15 mL Mouth Rinse 10 times per day  . metolazone  2.5 mg Oral Once  . montelukast  10 mg Per Tube QHS  . nutrition supplement (JUVEN)  1 packet Per Tube BID BM  . pantoprazole sodium  40 mg Per Tube BID  . propranolol  80 mg Per Tube TID  . psyllium  1 packet Per Tube BID  . QUEtiapine  50 mg Per Tube QHS  . sodium chloride flush  10-40 mL Intracatheter Q12H  . testosterone  5 g Transdermal Daily  . white petrolatum   Topical BID    Infusions: . sodium chloride    . sodium chloride 250 mL (10/08/19 1626)  . sodium chloride 250 mL (10/09/19 1707)  . dexmedetomidine (PRECEDEX) IV infusion 0.3 mcg/kg/hr (10/10/19 0600)  . heparin 1,750 Units/hr (10/10/19 0600)  . magnesium sulfate bolus IVPB    . niCARDipine Stopped (10/07/19 1742)    PRN Medications: Place/Maintain arterial line **AND** sodium chloride, sodium chloride, sodium chloride, acetaminophen (TYLENOL) oral liquid 160 mg/5 mL, acetaminophen, [COMPLETED] diazepam **FOLLOWED BY** diazepam, hydrALAZINE, HYDROmorphone, magnesium hydroxide, midazolam, ondansetron (ZOFRAN) IV, senna-docusate, sodium chloride, sodium chloride flush   Assessment/Plan:    1. Acute hypoxic/hypercarbic respiratory failure due to COVID PNA: Remained markedly hypoxic despite full support. Intubated 12/26. S/p bilateral CTs 12/26 for pneumothoraces.  TEE on 12/26 LVEF 70% RV ok. VV ECMO begun 12/26. Tracheostomy 12/29.  COVID ARDS at this point. ECMO circuit changed 1/8 & 1/23. Crescent cannula placed 2/2 (femoral cannuals removed) - Failed decannulation on 3/2. Now off sweep again. ABG looks good - Bronch 3/3 lookd great.  - ECMO parameters as  per ECMO note - Flows look good on Crescent - CXR stable - Completed 14 days  of meropenum on 2/28.   - Volume overloaded. Give lasix and metolazone today.  - Turn heparin off at 12p -> decannulate in ICU  - D/w Dr. Orvan Seen and [place] Gum.   2. Bilateral PTX: Due to barotrauma.  - Resolved. CTs out  3. COVID PNA: management of COVID infection per CCM. CXR with bilateral multifocal PNA progressing to likely ARDS.  - s/p 10 days of remdesivir - 12/16, 12/2 s/p tociluzimab - 12/17 s/p convalescent plasma - Decadron at 58m/daily completed 1/11 - Completed cefepime/vanc - Off Ancef  4. ID: Empiric coverage with vancomycin/cefepime initially for possible secondary bacterial PNA, course completed.   - treated with cefepime and vanc for + cx with staph epidimidis, MSSA and serratia (sensitivities ok). - developed recurrent PNA and sepsis 2/15 - BAL with > 100K serratia. BAL growing > 100K serratia. Completed 14 days mero on 2/28 - Repeat bronch 3/3 looked good   5. Anemia: - hgb 7.7 this am -> transfuse.  6. FEN - now with G-tube.Continue TFs  8. Hypernatremia - Na 147 -> 146 -> 144.  - Continue FW at current dose  9. AKI - Resolved. Volume status up today. Give lasix and metoalzone  10. DTIs - WOC following. Has evolving wound on left 5th toe. Continue to follow - Lesions stable today   Plan:  Give lasix and metolazone this am. Transfuse 1u Pull cannula later today.    Length of Stay: 80  CRITICAL CARE Performed by: BGlori Bickers Total critical care time: 35 minutes  Critical care time was exclusive of separately billable procedures and treating other patients.  Critical care was necessary to treat or prevent imminent or life-threatening deterioration.  Critical care was time spent personally by me (independent of midlevel providers or residents) on the following activities: development of treatment plan with patient and/or surrogate as well as nursing, discussions with consultants, evaluation of patient's response to treatment, examination of patient,  obtaining history from patient or surrogate, ordering and performing treatments and interventions, ordering and review of laboratory studies, ordering and review of radiographic studies, pulse oximetry and re-evaluation of patient's condition.    DGlori BickersMD 10/10/2019, 8:29 AM  Advanced Heart Failure Team Pager [phone](M-F; 7a - 4p)  Please contact [facility] for night-coverage after hours (4p -7a ) and weekends on [email or web address]

## 2019-10-10 NOTE — Progress Notes (Signed)
OT Treatment Note - late entry    10/09/19 1600  OT Visit Information  Last OT Received On 10/09/19  Assistance Needed +3 or more  PT/OT/SLP Co-Evaluation/Treatment Yes  Reason for Co-Treatment Complexity of the patient's impairments (multi-system involvement);For patient/therapist safety  OT goals addressed during session ADL's and self-care;Strengthening/ROM  History of Present Illness 44 y/o M admitted 12/14 with COVID pneumonia causing acute hypoxemic respiratory failure.   Developed pneumomediastinum 12/23 with concern for possible bilateral small pneumothoraces on 12/24. Pt intubated on 12/26 and started on ECMO on 12/27. Tracheostomy on 12/29 with bleeding from trach an dother sites on 12/31. Bronch on 1/11 and 1/15. Slowly weaning sedation 2/5. Continuing to wean sedation and starting sweep trials to wean ECMO  Precautions  Precautions Fall;Other (comment)  Precaution Comments ECMO R IJ, Vent to trach, G tube, necrosis bil. feet   Required Braces or Orthoses Other Brace  Other Brace B Prevalon  Pain Assessment  Pain Assessment Faces  Faces Pain Scale 6  Pain Location with R shoulder mobility intermittently  Pain Descriptors / Indicators Grimacing  Pain Intervention(s) Limited activity within patient's tolerance;Repositioned  Cognition  Arousal/Alertness Awake/alert  Behavior During Therapy Flat affect  Overall Cognitive Status Difficult to assess  Area of Impairment Attention;Following commands;Safety/judgement;Awareness;Problem solving  Current Attention Level Sustained  Following Commands Follows one step commands inconsistently  Safety/Judgement Decreased awareness of safety;Decreased awareness of deficits  Awareness Intellectual  Problem Solving Slow processing;Decreased initiation;Difficulty sequencing;Requires verbal cues;Requires tactile cues  General Comments Improved from previous session; appeared more uncomfortable at times adn frustrated   Difficult to assess due  to Tracheostomy  Upper Extremity Assessment  Upper Extremity Assessment Generalized weakness;RUE deficits/detail;LUE deficits/detail  RUE Deficits / Details Increased proximal movement compared to distal movement however, shoulder is tight in shoulder flexion with decreased scapular glide; strogner ability to protract adn push with elbow extension; 1/5 grip adn elbow flexion  RUE Coordination decreased fine motor;decreased gross motor  LUE Deficits / Details similar to RUE  LUE Coordination decreased fine motor;decreased gross motor  Lower Extremity Assessment  Lower Extremity Assessment Defer to PT evaluation  ADL  Overall ADL's  Needs assistance/impaired  Grooming Total assistance  Grooming Details (indicate cue type and reason) attempting to assist with grooming using hand over hand; pt unable to grasp objects  Bed Mobility  Overal bed mobility Needs Assistance  General bed mobility comments maxA of 2 to pull forward into unsupported sitting from bed in chair mode. Pt performs 10 repetitions of this during session. Pt pulling/pushing from side to side with PT cues and mod-maxA  Balance  Sitting balance-Leahy Scale Zero  Sitting balance - Comments Requires assist of 2 for trunk support in sitting (with bed in chair position). Able to initiate forward/backward trunk lean with verbal/tactile cues. Worked on cervical rotation and upright posture. Noted to have initiation of lumbar extension but not able to sustain.  Restrictions  Weight Bearing Restrictions No  Transfers  General transfer comment not attmepted  Exercises  Exercises Other exercises  General Exercises - Upper Extremity  Shoulder Flexion AAROM;PROM;Both;10 reps  Shoulder ABduction AAROM;PROM;Both;10 reps;Supine  Elbow Flexion AAROM;AROM;Both;10 reps;Seated  Elbow Extension AROM;AAROM;Both;10 reps;Seated  Wrist Flexion AAROM;PROM;Both;10 reps;Seated  Digit Composite Flexion PROM;Both;10 reps;Seated  Composite Extension  PROM;Both;10 reps;Seated  Other Exercises  Other Exercises shoulder flexion AAROM 10 reps bilaterally  Other Exercises Scapular protraction/retraction x10 reps AAROM bilaterally  Other Exercises PROM finger extension and flexion x5 reps bilaterally  OT - End of Session  Equipment Utilized During Treatment Other (comment) (vent/ECMO)  Activity Tolerance Patient tolerated treatment well  Patient left in bed;with call bell/phone within reach;with nursing/sitter in room;Other (comment) (chair position)  OT Assessment/Plan  OT Plan Discharge plan remains appropriate  OT Visit Diagnosis Muscle weakness (generalized) (M62.81);Cognitive communication deficit (R41.841)  OT Frequency (ACUTE ONLY) Min 3X/week  Follow Up Recommendations LTACH  OT Equipment Other (comment) (TBA)  AM-PAC OT "6 Clicks" Daily Activity Outcome Measure (Version 2)  Help from another person eating meals? 1  Help from another person taking care of personal grooming? 1  Help from another person toileting, which includes using toliet, bedpan, or urinal? 1  Help from another person bathing (including washing, rinsing, drying)? 1  Help from another person to put on and taking off regular upper body clothing? 1  Help from another person to put on and taking off regular lower body clothing? 1  6 Click Score 6  OT Goal Progression  Progress towards OT goals Progressing toward goals  Acute Rehab OT Goals  Patient Stated Goal unable to state  OT Goal Formulation Patient unable to participate in goal setting  Time For Goal Achievement 10/18/19  Potential to Achieve Goals Fair  ADL Goals  Additional ADL Goal #2 Pt will sustain attention to activity x 1 min with min cues  Additional ADL Goal #3 Pt will be able to move Lt UE to mouth with mod A  OT Time Calculation  OT Start Time (ACUTE ONLY) 1518  OT Stop Time (ACUTE ONLY) 1549  OT Time Calculation (min) 31 min  OT General Charges  $OT Visit 1 Visit  Maurie Boettcher, OT/L    Acute OT Clinical Specialist Acute Rehabilitation Services Pager [phone] Office [phone]

## 2019-10-10 NOTE — Progress Notes (Signed)
Occupational Therapy Treatment Patient Details Name: [name] MRN: [id number] DOB: [birth year] Today's Date: 10/10/2019    History of present illness 44 y/o M admitted 12/14 with COVID pneumonia causing acute hypoxemic respiratory failure.   Developed pneumomediastinum 12/23 with concern for possible bilateral small pneumothoraces on 12/24. Pt intubated on 12/26 and started on ECMO on 12/27. Tracheostomy on 12/29 with bleeding from trach an dother sites on 12/31. Bronch on 1/11 and 1/15. Slowly weaning sedation 2/5. Continuing to wean sedation and starting sweep trials to wean ECMO   OT comments  Pt more alert and following commands with improved sustained attention to session. Pt more animated during session - smiling appropriately at jokes. Focus of session on B scapular mobility followed by BUE ROM/strengthening. BUE strength improving - LUE stronger than R. Wife called after session to gain more personal information about [name] and to update on progress. She would like to [facility] during a therapy session next week if able. Recommend nsg keep BUE elevated on 2 pillows to decrease dependent edema. Recommend pt be placed in chair position in bed TID. At this time pt appropriate for [facility] level rehab. Will continue to follow acutely.   Follow Up Recommendations  LTACH    Equipment Recommendations  Other (comment)(TBA)    Recommendations for Other Services      Precautions / Restrictions Precautions Precautions: Fall;Other (comment) Precaution Comments: vent Required Braces or Orthoses: Other Brace(B prevalon)       Mobility Bed Mobility                  Transfers                      Balance                                           ADL either performed or assessed with clinical judgement   ADL Overall ADL's : Needs assistance/impaired                                       General ADL Comments: Pt requires total A for all  aspects      Vision   Additional Comments: improved visual attention   Perception     Praxis      Cognition Arousal/Alertness: Awake/alert Behavior During Therapy: Flat affect Overall Cognitive Status: Impaired/Different from baseline Area of Impairment: Attention;Following commands;Problem solving                   Current Attention Level: Sustained   Following Commands: Follows one step commands with increased time Safety/Judgement: Decreased awareness of safety;Decreased awareness of deficits Awareness: Emergent Problem Solving: Slow processing;Decreased initiation;Requires verbal cues;Requires tactile cues General Comments: Continues to improve with increased processing spee/decrease in delay; increased ability to attend dueing session        Exercises General Exercises - Upper Extremity Shoulder Flexion: AAROM;PROM;Both;15 reps;Supine Shoulder Extension: AROM;AAROM;Both;10 reps;Supine Shoulder ABduction: AROM;PROM;AAROM;Both;10 reps;Supine Shoulder ADduction: AAROM;AROM;Both;10 reps;Supine Shoulder Horizontal ABduction: AROM;AAROM;Both;10 reps;Supine Shoulder Horizontal ADduction: AROM;AAROM;Both;10 reps;Supine Elbow Flexion: AROM;AAROM;Both;10 reps;Supine Elbow Extension: AROM;AAROM;Both;10 reps;Supine Wrist Flexion: AAROM;AROM;Both;10 reps;Supine Wrist Extension: PROM;Both;AROM;AAROM;10 reps;Supine Digit Composite Flexion: AROM;AAROM;PROM;Both;10 reps;Supine Composite Extension: AROM;PROM;AAROM;Both;10 reps;Supine Other Exercises Other Exercises: B scapular mobility as able with positioning; working on protraction/retraction; elevation/depression   Shoulder  Instructions       General Comments      Pertinent Vitals/ Pain       Pain Assessment: Faces Faces Pain Scale: Hurts little more Pain Location: with B shoulder ROM, esp R @ 90 Pain Descriptors / Indicators: Grimacing Pain Intervention(s): Limited activity within patient's  tolerance;Repositioned  Home Living                                          Prior Functioning/Environment              Frequency  Min 3X/week        Progress Toward Goals  OT Goals(current goals can now be found in the care plan section)  Progress towards OT goals: Progressing toward goals  Acute Rehab OT Goals Patient Stated Goal: per wife for pt to get stronger OT Goal Formulation: With family Potential to Achieve Goals: Fair ADL Goals Additional ADL Goal #1: Pt will be able to follow one step commands consistently 50% of the time. Additional ADL Goal #2: Pt will sustain attention to activity x 1 min with min cues Additional ADL Goal #3: Pt will be able to move Lt UE to mouth with mod A Additional ADL Goal #4: Pt will maintain midline postural control EOB suing egress position with mod  A +2 in preparation for ADL tasks  Plan Discharge plan remains appropriate    Co-evaluation                 AM-PAC OT "6 Clicks" Daily Activity     Outcome Measure   Help from another person eating meals?: Total Help from another person taking care of personal grooming?: Total Help from another person toileting, which includes using toliet, bedpan, or urinal?: Total Help from another person bathing (including washing, rinsing, drying)?: Total Help from another person to put on and taking off regular upper body clothing?: Total Help from another person to put on and taking off regular lower body clothing?: Total 6 Click Score: 6    End of Session Equipment Utilized During Treatment: Other (comment)(vent - pressure control' Peep 10; FiO2 40%)  OT Visit Diagnosis: Muscle weakness (generalized) (M62.81);Cognitive communication deficit (R41.841)   Activity Tolerance Patient tolerated treatment well   Patient Left in bed;with call bell/phone within reach;with nursing/sitter in room   Nurse Communication Mobility status;Other (comment)(positioning)         Time: 5916-3846 OT Time Calculation (min): 41 min  Charges: OT General Charges $OT Visit: 1 Visit OT Treatments $Therapeutic Exercise: 38-52 mins  Maurie Boettcher, OT/L   Acute OT Clinical Specialist [place] Pager [phone] Office [phone]    [facility] 10/10/2019, 7:08 PM

## 2019-10-11 ENCOUNTER — Inpatient Hospital Stay (HOSPITAL_COMMUNITY): Payer: Managed Care, Other (non HMO)

## 2019-10-11 DIAGNOSIS — R609 Edema, unspecified: Secondary | ICD-10-CM

## 2019-10-11 DIAGNOSIS — R509 Fever, unspecified: Secondary | ICD-10-CM

## 2019-10-11 DIAGNOSIS — Z9911 Dependence on respirator [ventilator] status: Secondary | ICD-10-CM

## 2019-10-11 DIAGNOSIS — Z430 Encounter for attention to tracheostomy: Secondary | ICD-10-CM

## 2019-10-11 DIAGNOSIS — M7989 Other specified soft tissue disorders: Secondary | ICD-10-CM

## 2019-10-11 LAB — BASIC METABOLIC PANEL
Anion gap: 9 (ref 5–15)
BUN: 41 mg/dL — ABNORMAL HIGH (ref 6–20)
CO2: 34 mmol/L — ABNORMAL HIGH (ref 22–32)
Calcium: 8.6 mg/dL — ABNORMAL LOW (ref 8.9–10.3)
Chloride: 99 mmol/L (ref 98–111)
Creatinine, Ser: 0.62 mg/dL (ref 0.61–1.24)
GFR calc Af Amer: >60 mL/min (ref >60)
GFR calc non Af Amer: >60 mL/min (ref >60)
Glucose, Bld: 119 mg/dL — ABNORMAL HIGH (ref 70–99)
Potassium: 4.2 mmol/L (ref 3.5–5.1)
Sodium: 142 mmol/L (ref 135–145)

## 2019-10-11 LAB — C-REACTIVE PROTEIN: CRP: 13.9 mg/dL — ABNORMAL HIGH (ref <1.0)

## 2019-10-11 LAB — URINALYSIS, ROUTINE W REFLEX MICROSCOPIC
Bilirubin Urine: NEGATIVE
Glucose, UA: NEGATIVE mg/dL
Hgb urine dipstick: NEGATIVE
Ketones, ur: NEGATIVE mg/dL
Leukocytes,Ua: NEGATIVE
Nitrite: NEGATIVE
Protein, ur: NEGATIVE mg/dL
Specific Gravity, Urine: 1.016 (ref 1.005–1.030)
pH: 5 (ref 5.0–8.0)

## 2019-10-11 LAB — CULTURE, RESPIRATORY W GRAM STAIN
Culture: NO GROWTH
Culture: NO GROWTH

## 2019-10-11 LAB — COOXEMETRY PANEL
Carboxyhemoglobin: 3.3 % — ABNORMAL HIGH (ref 0.5–1.5)
Methemoglobin: 0.6 % (ref 0.0–1.5)
O2 Saturation: 77.1 %
Total hemoglobin: 8.9 g/dL — ABNORMAL LOW (ref 12.0–16.0)

## 2019-10-11 LAB — PHOSPHORUS: Phosphorus: 4 mg/dL (ref 2.5–4.6)

## 2019-10-11 LAB — GLUCOSE, CAPILLARY
Glucose-Capillary: 106 mg/dL — ABNORMAL HIGH (ref 70–99)
Glucose-Capillary: 118 mg/dL — ABNORMAL HIGH (ref 70–99)
Glucose-Capillary: 119 mg/dL — ABNORMAL HIGH (ref 70–99)
Glucose-Capillary: 130 mg/dL — ABNORMAL HIGH (ref 70–99)
Glucose-Capillary: 137 mg/dL — ABNORMAL HIGH (ref 70–99)
Glucose-Capillary: 163 mg/dL — ABNORMAL HIGH (ref 70–99)

## 2019-10-11 LAB — CBC
HCT: 29.8 % — ABNORMAL LOW (ref 39.0–52.0)
Hemoglobin: 8.9 g/dL — ABNORMAL LOW (ref 13.0–17.0)
MCH: 28.2 pg (ref 26.0–34.0)
MCHC: 29.9 g/dL — ABNORMAL LOW (ref 30.0–36.0)
MCV: 94.3 fL (ref 80.0–100.0)
Platelets: 135 10*3/uL — ABNORMAL LOW (ref 150–400)
RBC: 3.16 MIL/uL — ABNORMAL LOW (ref 4.22–5.81)
RDW: 18.1 % — ABNORMAL HIGH (ref 11.5–15.5)
WBC: 8.3 10*3/uL (ref 4.0–10.5)
nRBC: 0 % (ref 0.0–0.2)

## 2019-10-11 LAB — MAGNESIUM: Magnesium: 2.1 mg/dL (ref 1.7–2.4)

## 2019-10-11 LAB — LACTATE DEHYDROGENASE: LDH: 338 U/L — ABNORMAL HIGH (ref 98–192)

## 2019-10-11 LAB — FIBRINOGEN: Fibrinogen: 349 mg/dL (ref 210–475)

## 2019-10-11 LAB — LACTIC ACID, PLASMA: Lactic Acid, Venous: 1.2 mmol/L (ref 0.5–1.9)

## 2019-10-11 LAB — SEDIMENTATION RATE: Sed Rate: 56 mm/hr — ABNORMAL HIGH (ref 0–16)

## 2019-10-11 MED ORDER — HYDROCORTISONE NA SUCCINATE PF 100 MG IJ SOLR
50.0000 mg | Freq: Four times a day (QID) | INTRAMUSCULAR | Status: AC
  Administered 2019-10-11 – 2019-10-12 (×7): 50 mg via INTRAVENOUS
  Filled 2019-10-11 (×7): qty 2

## 2019-10-11 MED ORDER — VANCOMYCIN HCL IN DEXTROSE 1-5 GM/200ML-% IV SOLN
1000.0000 mg | Freq: Three times a day (TID) | INTRAVENOUS | Status: AC
  Administered 2019-10-11 – 2019-10-12 (×4): 1000 mg via INTRAVENOUS
  Filled 2019-10-11 (×4): qty 200

## 2019-10-11 MED ORDER — FUROSEMIDE 10 MG/ML IJ SOLN
40.0000 mg | Freq: Once | INTRAMUSCULAR | Status: AC
  Administered 2019-10-11: 40 mg via INTRAVENOUS
  Filled 2019-10-11: qty 4

## 2019-10-11 MED ORDER — SODIUM CHLORIDE 0.9 % IV SOLN
2.0000 g | Freq: Three times a day (TID) | INTRAVENOUS | Status: AC
  Administered 2019-10-11 – 2019-10-12 (×6): 2 g via INTRAVENOUS
  Filled 2019-10-11 (×10): qty 2

## 2019-10-11 MED ORDER — AMLODIPINE BESYLATE 5 MG PO TABS
5.0000 mg | ORAL_TABLET | Freq: Every day | ORAL | Status: DC
  Administered 2019-10-11 – 2019-10-24 (×13): 5 mg
  Filled 2019-10-11 (×14): qty 1

## 2019-10-11 MED ORDER — VANCOMYCIN HCL 2000 MG/400ML IV SOLN
2000.0000 mg | Freq: Once | INTRAVENOUS | Status: AC
  Administered 2019-10-11: 2000 mg via INTRAVENOUS
  Filled 2019-10-11: qty 400

## 2019-10-11 MED ORDER — ENOXAPARIN SODIUM 60 MG/0.6ML ~~LOC~~ SOLN
60.0000 mg | SUBCUTANEOUS | Status: DC
  Administered 2019-10-11 – 2019-10-20 (×10): 60 mg via SUBCUTANEOUS
  Filled 2019-10-11 (×10): qty 0.6

## 2019-10-11 MED ORDER — IPRATROPIUM-ALBUTEROL 0.5-2.5 (3) MG/3ML IN SOLN
3.0000 mL | Freq: Four times a day (QID) | RESPIRATORY_TRACT | Status: DC
  Administered 2019-10-11 – 2019-10-12 (×6): 3 mL via RESPIRATORY_TRACT
  Filled 2019-10-11 (×6): qty 3

## 2019-10-11 NOTE — Progress Notes (Signed)
Physical Therapy Treatment Patient Details Name: [name] MRN: [id number] DOB: May 03, 1976 Today's Date: 10/11/2019    History of Present Illness 44 y/o M admitted 12/14 with COVID pneumonia causing acute hypoxemic respiratory failure.   Developed pneumomediastinum 12/23 with concern for possible bilateral small pneumothoraces on 12/24. Pt intubated on 12/26 and started on ECMO on 12/27. Tracheostomy on 12/29 with bleeding from trach an dother sites on 12/31. Bronch on 1/11 and 1/15. Slowly weaning sedation 2/5. Continuing to wean sedation and starting sweep trials to wean ECMO - Decannulated 3/4    PT Comments    Pt limited by fatigue during session, tiring quickly with bed mobility and attempts at exercise to promote ROM and LE activation. Pt is able to pull into unsupported sitting with significant assistance of PT/OT, requiring maxA to maintain balance and intermittently activating core to fight against posterior lean. Pt remains generally weak currently, with some improvement in RLE muscle activation. Pt will continue to benefit from mobilization and initiation of attempts at sitting edge of bed to improve balance, strength, and activity tolerance.   Follow Up Recommendations  LTACH     Equipment Recommendations  Other (comment)(defer to post-acute setting)    Recommendations for Other Services       Precautions / Restrictions Precautions Precautions: Fall;Other (comment) Precaution Comments: vent Required Braces or Orthoses: Other Brace Other Brace: B Prevalon Restrictions Weight Bearing Restrictions: No    Mobility  Bed Mobility Overal bed mobility: Needs Assistance             General bed mobility comments: PT/OT assist pt into unsupported sitting in bed in chair position 3 times during session, maxA x 2. Pt assisted in scooting from side to side in bed  Transfers                 General transfer comment: unable to attempt   Ambulation/Gait                  Stairs             Wheelchair Mobility    Modified Rankin (Stroke Patients Only)       Balance Overall balance assessment: Needs assistance Sitting-balance support: Feet unsupported;Bilateral upper extremity supported Sitting balance-Leahy Scale: Zero Sitting balance - Comments: with bed in chair position, pt pulled foward into unsupported sitting with max A +2, and was able to maintain for up to 6 mins.  While forward, worked on neck rotation, as well as trunk rotation Lt and Rt  Postural control: Posterior lean                                  Cognition Arousal/Alertness: Awake/alert Behavior During Therapy: Flat affect Overall Cognitive Status: Difficult to assess Area of Impairment: Attention;Following commands                   Current Attention Level: Sustained   Following Commands: Follows one step commands with increased time;Follows one step commands consistently Safety/Judgement: Decreased awareness of safety;Decreased awareness of deficits Awareness: Emergent Problem Solving: Slow processing;Requires verbal cues;Requires tactile cues General Comments: Cognition difficult to accurately assess due to communication deficits.  He seemed suprised when he was told that he was at [facility]       Exercises Other Exercises Other Exercises: worked on [employer]. hands, elbow flex/ext, shoulder flexion Other Exercises: worked on trunk rotation Lt and Rt  Other Exercises: Head and neck rotation Lt and right, head/neck extension     General Comments General comments (skin integrity, edema, etc.): Pt on vent with trach, 50% Fi02; PEEP 10, HR in 80s, with 02 sats >94%       Pertinent Vitals/Pain Pain Assessment: Faces Faces Pain Scale: Hurts little more Pain Location: generalized grimmacing, and indicates buttocks hurt  Pain Descriptors / Indicators: Grimacing Pain Intervention(s): Repositioned;Monitored during session     Home Living                      Prior Function            PT Goals (current goals can now be found in the care plan section) Acute Rehab PT Goals Patient Stated Goal: per wife for pt to get stronger Progress towards PT goals: Progressing toward goals(very very slowly)    Frequency    Min 3X/week      PT Plan Current plan remains appropriate    Co-evaluation PT/OT/SLP Co-Evaluation/Treatment: Yes Reason for Co-Treatment: Complexity of the patient's impairments (multi-system involvement);For patient/therapist safety;To address functional/ADL transfers PT goals addressed during session: Mobility/safety with mobility;Strengthening/ROM;Balance OT goals addressed during session: Strengthening/ROM;ADL's and self-care      AM-PAC PT "6 Clicks" Mobility   Outcome Measure  Help needed turning from your back to your side while in a flat bed without using bedrails?: Total Help needed moving from lying on your back to sitting on the side of a flat bed without using bedrails?: Total Help needed moving to and from a bed to a chair (including a wheelchair)?: Total Help needed standing up from a chair using your arms (e.g., wheelchair or bedside chair)?: Total Help needed to walk in hospital room?: Total Help needed climbing 3-5 steps with a railing? : Total 6 Click Score: 6    End of Session Equipment Utilized During Treatment: Oxygen Activity Tolerance: Patient limited by fatigue Patient left: in bed;with call bell/phone within reach;with bed alarm set Nurse Communication: Mobility status PT Visit Diagnosis: Muscle weakness (generalized) (M62.81);Other symptoms and signs involving the nervous system (R29.898);Other abnormalities of gait and mobility (R26.89)     Time: 2595-6387 PT Time Calculation (min) (ACUTE ONLY): 28 min  Charges:  $Therapeutic Activity: 8-22 mins                     Zenaida Niece, PT, DPT Acute Rehabilitation Pager: [phone]    Zenaida Niece 10/11/2019, 4:49 PM

## 2019-10-11 NOTE — Progress Notes (Signed)
NAME:  ZAIRE LEVESQUE, MRN:  [id number], DOB:  [birth year], LOS: 81 ADMISSION DATE:  07/22/2019, CONSULTATION DATE:  12/24 REFERRING MD:  Sloan Leiter, CHIEF COMPLAINT:  Dyspnea   Brief History   44 y/o male admitted 12/14 with COVID pneumonia leading to ARDS, pneumomediastinum.  Intubated 12/26, bilateral chest tubes placed.  Past Medical History  GERD HTN Asthma  Significant Hospital Events   12/14 admit with hypoxemic respiratory failure in setting of COVID-19 pneumonia 12/23 noted to have pneumomediastinum on chest x-ray 12/24PCCM consulted for evaluation 12/26 worsening hypoxemia, intubated 12/27 VV fem/fem ECMO cannulation 12/29 tracheostomy >> See bedside nursing event notes for full rundown of procedures after 12/27 1/5 epistaxis, nasal packing 1/30 improved cxr, improved lung compliance, TV 350s on 15PC  1/31 CXR increased infiltrates, +CFB     2/2 Converted to right IJ Crescent cannula. 2/3 PEG tube inserted at bedside 2/14 30 min sweep trial> paO2 105 (18 PEEP, 60% FiO2) pH 7.22, pCO2 87 2/20 2 hours sweep trial 2/21 1.5 hour sweep trial 2/24 7-hour sweep trial.  Increasingly awake. 3/2 - decannulation attempt aborted due to increased hypercapnia when sedated for procedure  Consults:  PCCM, CTS, CHF, Palliative  Procedures:  PICC 1/3 >> R radial a-line 1/20 >> R IJ ECMO cath 2/3 >> Tracheostomy 12/29 >>  Significant Diagnostic Tests:  CT chest 12/22>>extensive pneumomediastinum, multifocal patchy bilateral groundglass opacities CXR 1/25 >> improving bilateral infiltrates. ECMO cannulae in place. CXR 2/5 >> clearing bilateral infiltrates  Micro Data:  BCx2 12/14 >>negative  Sputum 12/15 >> OPF 12/28 blood > negative 12/29 > staph aureus 1/4 bronch:  1/2 acid fast smear bronch: negative 1/2 fungal cx bronch: negative1/12 BAL: serratia, staph epidermidisu 1/4 BAL > MSSA, Serratia 1/9 blood > negative 1/9 Resp culture > MSSA, serratia 1/11 serratia >  negative 1/12 fungus culture > negative 1/12 BAL > serratia 1/18 BAL >> rare Serratia. 1/24 blood > negative 1/30 blood > negative 1/30 resp culture > serratia 2/1 BAL >> rare Serratia - enterococcus 2/1 aspergillus Ag > negative 2/2 resp culture > serratia  2/2 blood > negative 2/5 urine > negative 2/9 resp: serratia 2/15 BAL serratia  Antimicrobials:  Received remdesivir, steroids, actemra and convalescent plasma  5 days cefepime 12/24-12/29 Cefepime 1/4->1/13 vanc 1/4> 1/11 Ceftriaxone 1/14 > 1/27 vanc x1 1/20 Cefazolin 1/29 > 2/2 cefuroxim 2/2 vanc 2/2 Ceftriaxone x1 2/4 Meropenem 2/15>>2/21 Tobramycin inhaled 2/15>>2/21  Interim history/subjective:  Decannulated on 3/4 at bedside.  Febrile overnight.  Objective   Blood pressure 131/68, pulse 81, temperature 98.5 F (36.9 C), temperature source Oral, resp. rate (!) 23, height _0  (1.727 m), weight 132.6 kg, SpO2 99 %. CVP:  [2 mmHg-14 mmHg] 10 mmHg  Vent Mode: PCV FiO2 (%):  [35 %-50 %] 50 % Set Rate:  [20 bmp] 20 bmp PEEP:  [10 cmH20] 10 cmH20 Plateau Pressure:  [8 cmH20-35 cmH20] 30 cmH20   Intake/Output Summary (Last 24 hours) at 10/11/2019 1743 Last data filed at 10/11/2019 1600 Gross per 24 hour  Intake 2401.99 ml  Output 4145 ml  Net -1743.01 ml   Filed Weights   10/09/19 0500 10/10/19 0500 10/11/19 0627  Weight: 133.7 kg 135.2 kg 132.6 kg    Examination: GEN: Middle-age man lying in bed in no acute distress, on mechanical ventilation. HEENT: C/AT, eyes anicteric.  Oral mucosa moist. Neck: Trach in place, no bleeding or secretions CV: Regular rate and rhythm, no murmurs PULM: Clear to auscultation bilaterally.  Mild tachypnea, breathing  comfortably on the vent. GI: Obese, soft, nontender.  PEG EXT:  Persistent bilateral lower extremity edema NEURO: Sleeping comfortably, arouses easily to stimulation.  Attempts to communicate nonverbally.   PSYCH: RASS 0, CAM ICU negative SKIN: Pallor, no  bruising or petechiae.  ABG    Component Value Date/Time   PHART 7.384 10/10/2019 1817   PCO2ART 63.7 (H) 10/10/2019 1817   PO2ART 76.0 (L) 10/10/2019 1817   HCO3 38.0 (H) 10/10/2019 1817   TCO2 40 (H) 10/10/2019 1817   ACIDBASEDEF 4.0 (H) 09/16/2019 2334   O2SAT 77.1 10/11/2019 0424     Resolved Hospital Problem list   Pneumothoraces-resolved.  Assessment & Plan:   ARDS due to COVID 19, requiring mechanical ventilation.  Decannulated from VV ECMO on 3/4 after 65 days. --Continue lung protective ventilation-pressure control with pressure support of 15 above PEEP 10.  FiO2 50%. -VAP prevention protocol -Routine trach care -Sedation is required to tolerate mechanical ventilation.  As he improves we should be able to continue de-escalating his oral regimen.  Fever-unclear if this is cytokine response due to decannulation versus unidentified source of infection. -Empiric antibiotics for 48 hours -Panculture -CRP, sed rate -Steroids--other ECMO centers have seen similar events post decannulation with Covid patients and have recommended steroids.  AKI with azotemia - improving  Positive cumulative fluid balance Hypernatremia has corrected Severe third spacing -Continue diuresis  Hypertension -Amlodipine, clonidine, propranolol, and hydralazine -Pain and anxiety well controlled  Serratia pneumonia> treated.  Repeat bilateral BAL on 3/3 no growth. Stop date 2/22 on antibiotics  Neuromuscular weakness -PT, OT, manage sedation to facilitate ongoing rehabilitation. -Continue testosterone supplementation  Bilateral lower feet deep tissue injury -Appreciate wound care's assistance  Anemia due to critical illness, increased RBC turnover -Continue to monitor -Transfuse for hemoglobin less than 7  Best practice:  Pain/Anxiety/Delirium protocol (if indicated): PRNs only VAP protocol (if indicated): Bundle in place. Respiratory support goals: Continue current ultra lung  protective strategy.  DVT prophylaxis: Heparin drip, goal 0.3-0.5 GI prophylaxis: PPI Nutrition: TF Fluid status goals:  See above Urinary catheter: Guide hemodynamic management Glucose control: controlled with current regimen Mobility/therapy needs: Bedrest.  Daily labs: as per ECMO protocol. Code Status: Full code  Disposition: ICU Family: Updated wife via phone  Labs   CBC: CBC Latest Ref Rng & Units 10/11/2019 10/10/2019 10/10/2019  WBC 4.0 - 10.5 K/uL 8.3 - 9.4  Hemoglobin 13.0 - 17.0 g/dL 8.9(L) 9.9(L) 9.1(L)  Hematocrit 39.0 - 52.0 % 29.8(L) 29.0(L) 30.6(L)  Platelets 150 - 400 K/uL 135(L) - 120(L)     Basic Metabolic Panel: BMP Latest Ref Rng & Units 10/11/2019 10/10/2019 10/10/2019  Glucose 70 - 99 mg/dL 119(H) - 175(H)  BUN 6 - 20 mg/dL 41(H) - 52(H)  Creatinine 0.61 - 1.24 mg/dL 0.62 - 0.66  Sodium 135 - 145 mmol/L 142 145 144  Potassium 3.5 - 5.1 mmol/L 4.2 3.9 4.0  Chloride 98 - 111 mmol/L 99 - 102  CO2 22 - 32 mmol/L 34(H) - 33(H)  Calcium 8.9 - 10.3 mg/dL 8.6(L) - 8.6(L)     GFR: Estimated Creatinine Clearance: 155.2 mL/min (by C-G formula based on SCr of 0.62 mg/dL). Recent Labs  Lab 10/08/19 0357 10/08/19 1700 10/09/19 0411 10/09/19 0411 10/09/19 1631 10/10/19 0328 10/10/19 1808 10/11/19 0442 10/11/19 0450  WBC 8.5   < > 6.9   < > 9.4 6.2 9.4 8.3  --   LATICACIDVEN 0.9  --  1.1  --   --  0.9  --   --  1.2   < > = values in this interval not displayed.    Liver Function Tests: Recent Labs  Lab 10/06/19 0353 10/07/19 0317 10/10/19 0328  ALBUMIN 1.9* 1.9* 1.8*   No results for input(s): LIPASE, AMYLASE in the last 168 hours. No results for input(s): AMMONIA in the last 168 hours.  ABG    Component Value Date/Time   PHART 7.384 10/10/2019 1817   PCO2ART 63.7 (H) 10/10/2019 1817   PO2ART 76.0 (L) 10/10/2019 1817   HCO3 38.0 (H) 10/10/2019 1817   TCO2 40 (H) 10/10/2019 1817   ACIDBASEDEF 4.0 (H) 09/16/2019 2334   O2SAT 77.1 10/11/2019 0424      Coagulation Profile: No results for input(s): INR, PROTIME in the last 168 hours.  Cardiac Enzymes: No results for input(s): CKTOTAL, CKMB, CKMBINDEX, TROPONINI in the last 168 hours.  HbA1C: Hgb A1c MFr Bld  Date/Time Value Ref Range Status  09/23/2019 08:58 AM 5.5 4.8 - 5.6 % Final    Comment:    (NOTE) Pre diabetes:          5.7%-6.4% Diabetes:              >6.4% Glycemic control for   <7.0% adults with diabetes   07/24/2019 04:50 AM 6.7 (H) 4.8 - 5.6 % Final    Comment:    (NOTE) Pre diabetes:          5.7%-6.4% Diabetes:              >6.4% Glycemic control for   <7.0% adults with diabetes     CBG: Recent Labs  Lab 10/10/19 2354 10/11/19 0435 10/11/19 0735 10/11/19 1146 10/11/19 1637  GLUCAP 115* 119* 130* 137* 163*   This patient is critically ill with multiple organ system failure which requires frequent high complexity decision making, assessment, support, evaluation, and titration of therapies. This was completed through the application of advanced monitoring technologies and extensive interpretation of multiple databases.  Participated in multidisciplinary ECMO rounds.  During this encounter critical care time was devoted to patient care services described in this note for 40 minutes.    Julian Hy, DO 10/11/19 5:54 PM [facility]

## 2019-10-11 NOTE — Progress Notes (Signed)
Nutrition Follow up  DOCUMENTATION CODES:   Obesity unspecified  INTERVENTION:   Continue bolus feedings:  -1 can Osmolite 1.5 six times daily (0000, 0400, 0800, 1200, 1600, 2000) -60 ml Prostat QID -Juven BID -Free water flushes 300 ml Q6 hours- per CCM  Provides: 3090 kcals, 214 grams protein, 1086 ml free water (2286 ml with flushes). Meets 100% of needs.   NUTRITION DIAGNOSIS:   Increased nutrient needs related to acute illness(COVID PNA) as evidenced by estimated needs.  Ongoing  GOAL:   Patient will meet greater than or equal to 90% of their needs   Addressed via TF  MONITOR:   Diet advancement, Skin, TF tolerance, Weight trends, Labs, I & O's  REASON FOR ASSESSMENT:   Consult Enteral/tube feeding initiation and management  ASSESSMENT:   Patient with PMH significant for GERD, HTN, and asthma. Presents this admission with COVID-19 PNA.   12/14- admit 12/23- chest xray revealed pneumomediastinum  12/24- developed bilateral pneumothoraces  12/26- worsening hypoxemia, intubated  12/27- s/p VV fem/fem ECMO cannulation  12/29- s/p trach  1/5- nasal bleeding- packing per CCM 1/7- nasal packing per ENT 1/8- trach exchanged, ECMO circuit changed  1/20- s/p additional RIJ drainage cannula  1/21- Cortrak kinked, replaced by OGT, pt vomited over night 1/22- ECMO circuit exchanged  1/23- nasal endoscopy, control of epistaxis  2/2- TEE, Cortrak removed, change to crescent catheter, removal of femoral lines 2/3- s/p PEG, EGD 2/15- developed recurrent PNA/sepsis  3/2- decannulation cancelled  Pt discussed during ICU rounds and with RN.   Pt decannulated at bedside yesterday. Now febrile. CXR worse today. Hypernatremia resolved. Per nursing PEG tube had leakage yesterday. Seems to be resolved today but unable to administer gravity bolus. Gastrografin ordered. Abdomen feels soft today, having BMs. Continue current regimen. Wounds are stable.   Admission weight:  136.1 kg  Current weight: 132.6 kg (stable)   Patient on ventilator support via trach  MV: 13.4 L/min Temp (24hrs), Avg:99.7 F (37.6 C), Min:97 F (36.1 C), Max:103 F (39.4 C)   I/O: -6,425 ml since 2/19 UOP: 4,650 ml x 24 hrs   Drips: precedex  Medications: 500 mg Vit C daily, solucortef, SS novolog, levemir, metamucil Labs: CBG 82-175  Diet Order:   Diet Order            Diet NPO time specified  Diet effective midnight              EDUCATION NEEDS:   Not appropriate for education at this time  Skin:  Skin Assessment: Skin Integrity Issues: Skin Integrity Issues:: DTI, Stage II, Stage III DTI: bilateral heels Stage II: buttocks Stage III: neck from trach ties Incisions: L wrist, nose Other: n/a  Last BM:  3/4  Height:   Ht Readings from Last 1 Encounters:  08/09/19 5' 8"  (1.727 m)    Weight:   Wt Readings from Last 1 Encounters:  10/11/19 132.6 kg    Ideal Body Weight:  70 kg  BMI:  Body mass index is 44.45 kg/m.  Estimated Nutritional Needs:   Kcal:  6886-4847 kcal (25-30 kcal/kg)  Protein:  180-215 grams  Fluid:  >/= 2.5 L/day   [name] RD, LDN Clinical Nutrition Pager # - [phone]

## 2019-10-11 NOTE — Progress Notes (Signed)
Occupational Therapy Treatment Patient Details Name: [name] MRN: [id number] DOB: 1976-05-18 Today's Date: 10/11/2019    History of present illness 44 y/o M admitted 12/14 with COVID pneumonia causing acute hypoxemic respiratory failure.   Developed pneumomediastinum 12/23 with concern for possible bilateral small pneumothoraces on 12/24. Pt intubated on 12/26 and started on ECMO on 12/27. Tracheostomy on 12/29 with bleeding from trach an dother sites on 12/31. Bronch on 1/11 and 1/15. Slowly weaning sedation 2/5. Continuing to wean sedation and starting sweep trials to wean ECMO - Decannulated 3/4   OT comments  Pt seen in conjunction with PT.  He was moved into chair position, and pulled into unsupported sitting with max A +2 and maintained sitting for up to 6 mins while working on head/neck rotation, trunk rotation and AAROM bil. UEs.  He tolerated activity well with 02 sats remaining >94% on 50% Fi02, 10PEEp.  HR in the 80s.  He continued to demonstrate improving strength and activity tolerance, and will continue to benefit from continue therapies to maximize strength and independence.  Recommend LTACH.   Follow Up Recommendations  LTACH    Equipment Recommendations  None recommended by OT    Recommendations for Other Services      Precautions / Restrictions Precautions Precautions: Fall;Other (comment) Precaution Comments: vent Required Braces or Orthoses: Other Brace Other Brace: B Prevalon       Mobility Bed Mobility                  Transfers                 General transfer comment: unable to attempt     Balance Overall balance assessment: Needs assistance Sitting-balance support: Feet unsupported;Bilateral upper extremity supported Sitting balance-Leahy Scale: Zero Sitting balance - Comments: with bed in chair position, pt pulled foward into unsupported sitting with max A +2, and was able to maintain for up to 6 mins.  While forward, worked on neck  rotation, as well as trunk rotation Lt and Rt                                    ADL either performed or assessed with clinical judgement   ADL Overall ADL's : Needs assistance/impaired     Grooming: Wash/dry face;Total assistance;Bed level                                       Vision       Perception     Praxis      Cognition Arousal/Alertness: Awake/alert Behavior During Therapy: Flat affect Overall Cognitive Status: Difficult to assess Area of Impairment: Attention;Following commands                   Current Attention Level: Sustained   Following Commands: Follows one step commands with increased time;Follows one step commands consistently Safety/Judgement: Decreased awareness of safety;Decreased awareness of deficits   Problem Solving: Slow processing;Requires verbal cues;Requires tactile cues General Comments: Cognition difficult to accurately assess due to communication deficits.  He seemed suprised when he was told that he was at [facility]         Exercises Other Exercises Other Exercises: worked on [employer]. hands, elbow flex/ext, shoulder flexion Other Exercises: worked on trunk rotation Lt and Rt  Other Exercises:  Head and neck rotation Lt and right, head/neck extension    Shoulder Instructions       General Comments Pt on vent with trach, 50% Fi02; PEEP 10, HR in 80s, with 02 sats >94%     Pertinent Vitals/ Pain       Pain Assessment: Faces Faces Pain Scale: Hurts little more Pain Location: generalized grimmacing, and indicates buttocks hurt  Pain Descriptors / Indicators: Grimacing Pain Intervention(s): Repositioned;Monitored during session  Home Living                                          Prior Functioning/Environment              Frequency  Min 3X/week        Progress Toward Goals  OT Goals(current goals can now be found in the care plan section)  Progress  towards OT goals: Progressing toward goals     Plan Discharge plan remains appropriate    Co-evaluation    PT/OT/SLP Co-Evaluation/Treatment: Yes Reason for Co-Treatment: Complexity of the patient's impairments (multi-system involvement);For patient/therapist safety;To address functional/ADL transfers   OT goals addressed during session: Strengthening/ROM;ADL's and self-care      AM-PAC OT "6 Clicks" Daily Activity     Outcome Measure   Help from another person eating meals?: Total Help from another person taking care of personal grooming?: Total Help from another person toileting, which includes using toliet, bedpan, or urinal?: Total Help from another person bathing (including washing, rinsing, drying)?: Total Help from another person to put on and taking off regular upper body clothing?: Total Help from another person to put on and taking off regular lower body clothing?: Total 6 Click Score: 6    End of Session    OT Visit Diagnosis: Muscle weakness (generalized) (M62.81);Cognitive communication deficit (R41.841)   Activity Tolerance Patient tolerated treatment well   Patient Left in bed;with call bell/phone within reach   Nurse Communication Mobility status        Time: 8466-5993 OT Time Calculation (min): 30 min  Charges: OT General Charges $OT Visit: 1 Visit OT Treatments $Neuromuscular Re-education: 8-22 mins  Nilsa Nutting OTR/L Acute Rehabilitation Services Pager [phone] Office [phone]    Lucille Passy M 10/11/2019, 4:39 PM

## 2019-10-11 NOTE — Progress Notes (Signed)
Bilateral upper extremity and lower extremity venous duplexes completed. Refer to "CV Proc" under chart review to view preliminary results.  10/11/2019 12:19 PM Kelby Aline., MHA, RVT, RDCS, RDMS

## 2019-10-11 NOTE — Progress Notes (Signed)
Patient ID: [name], male   DOB: September 14, 1975, 44 y.o.   MRN: [id number]    Advanced Heart Failure Rounding Note   Subjective:    Decannulated yesterday (3/4)   Remains on vent through trach. Febrile this am to 103.   Sleeping but arousable. On precedex. HGb stable after cannula removal.   CXR worse today with bilateral infiltrates Personally reviewed   Objective:   Weight Range:  Vital Signs:   Temp:  [97 F (36.1 C)-103 F (39.4 C)] 103 F (39.4 C) (03/05 0627) Pulse Rate:  [74-106] 104 (03/05 0627) Resp:  [19-35] 31 (03/05 0627) BP: (112-148)/(54-59) 148/57 (03/04 1514) SpO2:  [88 %-100 %] 88 % (03/05 0627) Arterial Line BP: (99-285)/(46-271) 155/59 (03/05 0627) FiO2 (%):  [35 %-40 %] 35 % (03/05 0400) Weight:  [132.6 kg] 132.6 kg (03/05 0627) Last BM Date: 10/10/19  Weight change: Filed Weights   10/09/19 0500 10/10/19 0500 10/11/19 0627  Weight: 133.7 kg 135.2 kg 132.6 kg    Intake/Output:   Intake/Output Summary (Last 24 hours) at 10/11/2019 0813 Last data filed at 10/11/2019 0629 Gross per 24 hour  Intake 1748.58 ml  Output 4650 ml  Net -2901.42 ml     Physical Exam : General:  Sedated but arousable.  HEENT: normal Neck: supple.+ trach. RIJ site ok Carotids 2+ bilat; no bruits. No lymphadenopathy or thryomegaly appreciated. Cor: PMI nondisplaced. Regular rate & rhythm. No rubs, gallops or murmurs. Lungs: coarse Abdomen: obese soft, nontender, nondistended. No hepatosplenomegaly. No bruits or masses. Good bowel sounds. Extremities: no cyanosis, clubbing, rash, 2-3+ edema diffusely. DTIs on feet Neuro:  Sedated but arousable.    Telemetry: Sinus tach 100-110 Personally reviewed   Labs: Basic Metabolic Panel: Recent Labs  Lab 10/06/19 0353 10/07/19 0312 10/07/19 0317 10/07/19 0406 10/08/19 0357 10/08/19 0421 10/09/19 0411 10/09/19 0420 10/09/19 0919 10/09/19 1133 10/09/19 1631 10/09/19 1641 10/10/19 0328 10/10/19 0548  10/10/19 1217 10/10/19 1556 10/10/19 1808 10/10/19 1817 10/11/19 0442  NA   < >  --  147*   < > 146*   < >  --    < > 143   < > 142   < > 144   < > 144 144 144 145 142  K   < >  --  4.7   < > 4.9   < >  --    < > 4.6   < > 4.5   < > 4.4   < > 4.1 4.1 4.0 3.9 4.2  CL   < >  --  106   < > 105   < >  --   --  101  --  101  --  102  --   --   --  102  --  99  CO2   < >  --  34*   < > 33*   < >  --   --  33*  --  33*  --  33*  --   --   --  33*  --  34*  GLUCOSE   < >  --  74   < > 87   < >  --   --  93  --  109*  --  94  --   --   --  175*  --  119*  BUN   < >  --  68*   < > 66*   < >  --   --  61*  --  66*  --  60*  --   --   --  52*  --  41*  CREATININE   < >  --  0.64   < > 0.57*   < >  --   --  0.61  --  0.60*  --  0.61  --   --   --  0.66  --  0.62  CALCIUM   < >  --  8.9   < > 8.5*   < >  --   --  8.6*   < > 8.6*   < > 8.6*  --   --   --  8.6*  --  8.6*  MG  --  1.9  --   --  2.2  --  2.0  --   --   --   --   --  1.8  --   --   --   --   --  2.1  PHOS   < > 3.6 3.6  --  3.6  --  3.9  --   --   --   --   --  4.0  --   --   --   --   --  4.0   < > = values in this interval not displayed.    Liver Function Tests: Recent Labs  Lab 10/06/19 0353 10/07/19 0317 10/10/19 0328  ALBUMIN 1.9* 1.9* 1.8*   No results for input(s): LIPASE, AMYLASE in the last 168 hours. No results for input(s): AMMONIA in the last 168 hours.  CBC: Recent Labs  Lab 10/09/19 0411 10/09/19 0420 10/09/19 1631 10/09/19 1641 10/10/19 0328 10/10/19 0548 10/10/19 1217 10/10/19 1556 10/10/19 1808 10/10/19 1817 10/11/19 0442  WBC 6.9  --  9.4  --  6.2  --   --   --  9.4  --  8.3  HGB 8.0*   < > 8.3*   < > 7.7*   < > 8.8* 9.5* 9.1* 9.9* 8.9*  HCT 27.6*   < > 29.0*   < > 26.6*   < > 26.0* 28.0* 30.6* 29.0* 29.8*  MCV 97.5  --  97.0  --  97.8  --   --   --  96.2  --  94.3  PLT 102*  --  115*  --  98*  --   --   --  120*  --  135*   < > = values in this interval not displayed.    Cardiac Enzymes: No  results for input(s): CKTOTAL, CKMB, CKMBINDEX, TROPONINI in the last 168 hours.  BNP: BNP (last 3 results) Recent Labs    07/22/19 1039  BNP 15.3    ProBNP (last 3 results) No results for input(s): PROBNP in the last 8760 hours.    Other results:  Imaging: DG Abd 1 View  Result Date: 10/10/2019 CLINICAL DATA:  44 year male with PEG tube placement. EXAM: ABDOMEN - 1 VIEW COMPARISON:  Abdominal radiograph dated 08/29/2019. FINDINGS: Percutaneous gastrostomy with tip in the left upper abdomen. Contrast injected via tube opacifies the stomach and duodenal C-loop. No extraluminal spillage of contrast identified. IMPRESSION: Gastrostomy tube over the left upper abdomen. Electronically Signed   By: Anner Crete M.D.   On: 10/10/2019 18:31   DG CHEST PORT 1 VIEW  Result Date: 10/10/2019 CLINICAL DATA:  Tracheostomy, ECMO, respiratory failure EXAM: PORTABLE CHEST 1 VIEW COMPARISON:  10/09/2019 FINDINGS: No significant change in AP portable  examination with bilateral heterogeneous and interstitial opacity, tracheostomy, cardiomegaly, and ECMO cannula. IMPRESSION: No significant change in AP portable examination with bilateral heterogeneous and interstitial opacity in keeping with multifocal infection, edema, and/or ARDS, with support apparatus including ECMO cannula. Electronically Signed   By: Eddie Candle M.D.   On: 10/10/2019 09:02     Medications:     Scheduled Medications: . sodium chloride   Intravenous Once  . sodium chloride   Intravenous Once  . acetaminophen (TYLENOL) oral liquid 160 mg/5 mL  650 mg Per Tube Q6H  . amiodarone  200 mg Per Tube Daily  . amLODipine  10 mg Per Tube Daily  . vitamin C  500 mg Per Tube Daily  . chlorhexidine      . chlorhexidine gluconate (MEDLINE KIT)  15 mL Mouth Rinse BID  . Chlorhexidine Gluconate Cloth  6 each Topical Daily  . cloNIDine  0.2 mg Per Tube Q6H  . diatrizoate meglumine-sodium  30 mL Oral Once  . feeding supplement  (OSMOLITE 1.5 CAL)  237 mL Per Tube 6 X Daily  . feeding supplement (PRO-STAT SUGAR FREE 64)  60 mL Per Tube QID  . free water  300 mL Per Tube Q6H  . Gerhardt's butt cream   Topical QID  . hydrALAZINE  100 mg Per Tube Q8H  . insulin aspart  0-20 Units Subcutaneous Q4H  . insulin aspart  5 Units Subcutaneous Q4H  . insulin detemir  12 Units Subcutaneous BID  . ipratropium-albuterol  3 mL Nebulization Q4H  . magic mouthwash  5 mL Oral TID  . mouth rinse  15 mL Mouth Rinse 10 times per day  . montelukast  10 mg Per Tube QHS  . nutrition supplement (JUVEN)  1 packet Per Tube BID BM  . pantoprazole sodium  40 mg Per Tube BID  . propranolol  80 mg Per Tube TID  . psyllium  1 packet Per Tube BID  . QUEtiapine  50 mg Per Tube QHS  . sodium chloride flush  10-40 mL Intracatheter Q12H  . testosterone  5 g Transdermal Daily  . white petrolatum   Topical BID    Infusions: . sodium chloride    . sodium chloride 250 mL (10/08/19 1626)  . sodium chloride Stopped (10/10/19 1422)  . dexmedetomidine (PRECEDEX) IV infusion 0.6 mcg/kg/hr (10/11/19 0620)  . niCARDipine Stopped (10/07/19 1742)    PRN Medications: Place/Maintain arterial line **AND** sodium chloride, sodium chloride, sodium chloride, acetaminophen (TYLENOL) oral liquid 160 mg/5 mL, acetaminophen, [COMPLETED] diazepam **FOLLOWED BY** diazepam, hydrALAZINE, HYDROmorphone, magnesium hydroxide, midazolam, ondansetron (ZOFRAN) IV, senna-docusate, sodium chloride, sodium chloride flush   Assessment/Plan:    1. Acute hypoxic/hypercarbic respiratory failure due to COVID PNA: Remained markedly hypoxic despite full support. Intubated 12/26. S/p bilateral CTs 12/26 for pneumothoraces.  TEE on 12/26 LVEF 70% RV ok. VV ECMO begun 12/26. Tracheostomy 12/29.  COVID ARDS at this point. ECMO circuit changed 1/8 & 1/23. Crescent cannula placed 2/2 (femoral cannuals removed) - Decannulated 3/4 - Bronch 3/3 looked great.  - CXR worse today. Will give  IV lasix and restart lasix - With fevers will reculture, restart abx and start steroids for possible post-decannulation SIRS response. WBC 8.3 - Completed 14 days  of meropenum on 2/28. Restarting today - Volume overloaded. Repeat lasix today - DVT dose heparin  2. Bilateral PTX: Due to barotrauma.  - Resolved. CTs out  3. COVID PNA: management of COVID infection per CCM. CXR with bilateral multifocal PNA progressing  to likely ARDS.  - s/p 10 days of remdesivir - 12/16, 12/2 s/p tociluzimab - 12/17 s/p convalescent plasma - Decadron at 57m/daily completed 1/11 - Completed cefepime/vanc - Off Ancef  4. ID: Empiric coverage with vancomycin/cefepime initially for possible secondary bacterial PNA, course completed.   - treated with cefepime and vanc for + cx with staph epidimidis, MSSA and serratia (sensitivities ok). - developed recurrent PNA and sepsis 2/15 - BAL with > 100K serratia. BAL growing > 100K serratia. Completed 14 days mero on 2/28 - Repeat bronch 3/3 looked good  - With fevers will reculture, restart abx and start steroids for possible post-decannulation SIRS response  5. Anemia: - hgb 8.9  6. FEN - now with G-tube.Continue TFs  8. Hypernatremia - Na 147 -> 146 -> 142 - Continue FW at current dose. May be able to decrease soon  9. AKI - Resolved. Volume status up today. Repeat lasix  10. DTIs - WOC following. Has evolving wound on left 5th toe. Continue to follow - Lesions stable today   Length of Stay: 847 CRITICAL CARE Performed by: BGlori Bickers Total critical care time: 40 minutes  Critical care time was exclusive of separately billable procedures and treating other patients.  Critical care was necessary to treat or prevent imminent or life-threatening deterioration.  Critical care was time spent personally by me (independent of midlevel providers or residents) on the following activities: development of treatment plan with patient and/or  surrogate as well as nursing, discussions with consultants, evaluation of patient's response to treatment, examination of patient, obtaining history from patient or surrogate, ordering and performing treatments and interventions, ordering and review of laboratory studies, ordering and review of radiographic studies, pulse oximetry and re-evaluation of patient's condition.    DGlori BickersMD 10/11/2019, 8:13 AM  Advanced Heart Failure Team Pager [phone](M-F; 7a - 4p)  Please contact [email or web address] for night-coverage after hours (4p -7a ) and weekends on [email or web address]

## 2019-10-11 NOTE — Progress Notes (Signed)
Pharmacy Antibiotic Note  [name] is a 44 y.o. male admitted on 07/22/2019 with COVID respiratory distress, s/p ECMO- decannulated 3/4 now with SIRS like reaction with high fevers, WBC wnl, will recheck cx and empiric ABX for 48hour and reassess .  Pharmacy has been consulted for vancomycin and meropenem dosing.  Plan: Vancomycin 2gm x1 then 1gm q8h x48hr Meropenem 2gm IV q8h x48hr  Height: _0  (172.7 cm) Weight: 292 lb 5.3 oz (132.6 kg) IBW/kg (Calculated) : 68.4  Temp (24hrs), Avg:99.3 F (37.4 C), Min:97 F (36.1 C), Max:103 F (39.4 C)  Recent Labs  Lab 10/07/19 0317 10/07/19 1610 10/08/19 0357 10/08/19 0357 10/08/19 1700 10/09/19 0411 10/09/19 0919 10/09/19 1631 10/10/19 0328 10/10/19 1808 10/11/19 0442 10/11/19 0450  WBC  --    < > 8.5  --    < > 6.9  --  9.4 6.2 9.4 8.3  --   CREATININE 0.64   < > 0.57*   < >  --   --  0.61 0.60* 0.61 0.66 0.62  --   LATICACIDVEN 0.7  --  0.9  --   --  1.1  --   --  0.9  --   --  1.2   < > = values in this interval not displayed.    Estimated Creatinine Clearance: 155.2 mL/min (by C-G formula based on SCr of 0.62 mg/dL).    No Known Allergies  Antimicrobials this admission: Remdesivir 12/14>>12/23 Actemra x1 12/16; 12/22 Plasma 12/17 Vanco 12/26 >>12/28, 1/4>>1/13, 2/15>>2/17 Cefepime 12/26 >>12/30, 1/4>>1/13 Meropenem 2/15>>2/28 Tobramycin (neb) 2/16>>2/23 Ctx 1/15>> (nose packing)1/28 restarted for 1 dose when changed foley 2/5 Ancef nasal packing 1/28>2/3   Dose adjustments this admission:  Microbiology results: 12/14 BCx: ngF 12/14 sputum: Normal respiratory flora  12/25 MRSA PCR neg  12/28 trach: few MSSA 12/28 BCx: ngF 1/2 bronch: >100 k serratia marcescens (R cefazolin), >100 k staph epidermidis (S vanc, tetra) 1/4 BAL: MSSA, serratia marcescens (R cefazolin) 1/9 sputum: serratia/staph 1/9 bldx2 -ng 1/11 BAL: serratia 1/12 BAL: serratia 1/18 BAL: serratia - S cefepime 1/24 BCx x 2: ngF 1/30  Bcx: ngF 1/30 TA: mod serratia (R cefazolin and CTX) 2/1 BAL: 30k serratia (R- cefazolin), 10k enterococcus 2/2 Sputum: few GNR, rare GPRs 2/2 Bcx x2: ngF 2/2 UCx: ngF 2/5 UCx: ngF 2/9 BAL: mod serratia, rare MSSA 2/15 Bcx x2: ng 2/15 BAL: >100k serratia - only R cefazolin  Bonnita Nasuti Pharm.D. CPP, BCPS Clinical Pharmacist [phone] 10/11/2019 8:41 AM

## 2019-10-12 DIAGNOSIS — J9601 Acute respiratory failure with hypoxia: Secondary | ICD-10-CM

## 2019-10-12 LAB — BPAM RBC
Blood Product Expiration Date: 202103282359
Blood Product Expiration Date: 202103292359
Blood Product Expiration Date: 202103302359
Blood Product Expiration Date: 202103302359
Blood Product Expiration Date: 202104022359
Blood Product Expiration Date: 202104032359
Blood Product Expiration Date: 202104032359
Blood Product Expiration Date: 202104032359
ISSUE DATE / TIME: 202103041405
ISSUE DATE / TIME: 202103050204
ISSUE DATE / TIME: 202103050454
ISSUE DATE / TIME: 202103051028
Unit Type and Rh: 5100
Unit Type and Rh: 5100
Unit Type and Rh: 5100
Unit Type and Rh: 5100
Unit Type and Rh: 5100
Unit Type and Rh: 5100
Unit Type and Rh: 5100
Unit Type and Rh: 5100

## 2019-10-12 LAB — TYPE AND SCREEN
ABO/RH(D): O POS
Antibody Screen: NEGATIVE
Unit division: 0
Unit division: 0
Unit division: 0
Unit division: 0
Unit division: 0
Unit division: 0
Unit division: 0
Unit division: 0

## 2019-10-12 LAB — GLUCOSE, CAPILLARY
Glucose-Capillary: 139 mg/dL — ABNORMAL HIGH (ref 70–99)
Glucose-Capillary: 113 mg/dL — ABNORMAL HIGH (ref 70–99)
Glucose-Capillary: 145 mg/dL — ABNORMAL HIGH (ref 70–99)
Glucose-Capillary: 116 mg/dL — ABNORMAL HIGH (ref 70–99)
Glucose-Capillary: 143 mg/dL — ABNORMAL HIGH (ref 70–99)
Glucose-Capillary: 89 mg/dL (ref 70–99)

## 2019-10-12 LAB — COOXEMETRY PANEL
Carboxyhemoglobin: 2.8 % — ABNORMAL HIGH (ref 0.5–1.5)
Carboxyhemoglobin: 3.3 % — ABNORMAL HIGH (ref 0.5–1.5)
Methemoglobin: 0.6 % (ref 0.0–1.5)
Methemoglobin: 1 % (ref 0.0–1.5)
O2 Saturation: 82.1 %
O2 Saturation: 79.7 %
Total hemoglobin: 9.1 g/dL — ABNORMAL LOW (ref 12.0–16.0)
Total hemoglobin: 11.2 g/dL — ABNORMAL LOW (ref 12.0–16.0)

## 2019-10-12 LAB — CBC
HCT: 27.1 % — ABNORMAL LOW (ref 39.0–52.0)
Hemoglobin: 7.8 g/dL — ABNORMAL LOW (ref 13.0–17.0)
MCH: 28.2 pg (ref 26.0–34.0)
MCHC: 28.8 g/dL — ABNORMAL LOW (ref 30.0–36.0)
MCV: 97.8 fL (ref 80.0–100.0)
Platelets: 124 K/uL — ABNORMAL LOW (ref 150–400)
RBC: 2.77 MIL/uL — ABNORMAL LOW (ref 4.22–5.81)
RDW: 17.1 % — ABNORMAL HIGH (ref 11.5–15.5)
WBC: 6 K/uL (ref 4.0–10.5)
nRBC: 0 % (ref 0.0–0.2)

## 2019-10-12 LAB — BASIC METABOLIC PANEL
Anion gap: 7 (ref 5–15)
BUN: 55 mg/dL — ABNORMAL HIGH (ref 6–20)
CO2: 35 mmol/L — ABNORMAL HIGH (ref 22–32)
Calcium: 8.3 mg/dL — ABNORMAL LOW (ref 8.9–10.3)
Chloride: 99 mmol/L (ref 98–111)
Creatinine, Ser: 0.68 mg/dL (ref 0.61–1.24)
GFR calc Af Amer: >60 mL/min (ref >60)
GFR calc non Af Amer: >60 mL/min (ref >60)
Glucose, Bld: 140 mg/dL — ABNORMAL HIGH (ref 70–99)
Potassium: 3.9 mmol/L (ref 3.5–5.1)
Sodium: 141 mmol/L (ref 135–145)

## 2019-10-12 LAB — LACTATE DEHYDROGENASE: LDH: 255 U/L — ABNORMAL HIGH (ref 98–192)

## 2019-10-12 LAB — FIBRINOGEN: Fibrinogen: 360 mg/dL (ref 210–475)

## 2019-10-12 LAB — MAGNESIUM: Magnesium: 2 mg/dL (ref 1.7–2.4)

## 2019-10-12 LAB — PHOSPHORUS: Phosphorus: 4.1 mg/dL (ref 2.5–4.6)

## 2019-10-12 LAB — LACTIC ACID, PLASMA: Lactic Acid, Venous: 1.2 mmol/L (ref 0.5–1.9)

## 2019-10-12 MED ORDER — FUROSEMIDE 10 MG/ML IJ SOLN
8.0000 mg/h | INTRAVENOUS | Status: DC
  Administered 2019-10-12 – 2019-10-13 (×2): 8 mg/h via INTRAVENOUS
  Filled 2019-10-12 (×2): qty 25

## 2019-10-12 MED ORDER — POTASSIUM CHLORIDE 20 MEQ/15ML (10%) PO SOLN
20.0000 meq | Freq: Two times a day (BID) | ORAL | Status: DC
  Administered 2019-10-12 (×2): 20 meq
  Filled 2019-10-12 (×2): qty 15

## 2019-10-12 MED ORDER — POTASSIUM CHLORIDE ER 10 MEQ PO TBCR
20.0000 meq | EXTENDED_RELEASE_TABLET | Freq: Two times a day (BID) | ORAL | Status: DC
  Filled 2019-10-12: qty 2

## 2019-10-12 NOTE — Progress Notes (Signed)
Assisted tele visit to patient with family members.  Karlina Suares, Janace Hoard, RN

## 2019-10-12 NOTE — Progress Notes (Signed)
NAME:  [name], MRN:  [id number], DOB:  04/11/1976, LOS: 82 ADMISSION DATE:  07/22/2019, CONSULTATION DATE:  12/24 REFERRING MD:  Sloan Leiter, CHIEF COMPLAINT:  Dyspnea   Brief History   44 y/o male admitted 12/14 with COVID pneumonia leading to ARDS, pneumomediastinum.  Intubated 12/26, bilateral chest tubes placed.  Past Medical History  GERD HTN Asthma  Significant Hospital Events   12/14 admit with hypoxemic respiratory failure in setting of COVID-19 pneumonia 12/23 noted to have pneumomediastinum on chest x-ray 12/24PCCM consulted for evaluation 12/26 worsening hypoxemia, intubated 12/27 VV fem/fem ECMO cannulation 12/29 tracheostomy >> See bedside nursing event notes for full rundown of procedures after 12/27 1/5 epistaxis, nasal packing 1/30 improved cxr, improved lung compliance, TV 350s on 15PC  1/31 CXR increased infiltrates, +CFB     2/2 Converted to right IJ Crescent cannula. 2/3 PEG tube inserted at bedside 2/14 30 min sweep trial> paO2 105 (18 PEEP, 60% FiO2) pH 7.22, pCO2 87 2/20 2 hours sweep trial 2/21 1.5 hour sweep trial 2/24 7-hour sweep trial.  Increasingly awake. 3/2 - decannulation attempt aborted due to increased hypercapnia when sedated for procedure  Consults:  PCCM, CTS, CHF, Palliative  Procedures:  PICC 1/3 >> R radial a-line 1/20 >> R IJ ECMO cath 2/3 >> Tracheostomy 12/29 >>  Significant Diagnostic Tests:  CT chest 12/22>>extensive pneumomediastinum, multifocal patchy bilateral groundglass opacities CXR 1/25 >> improving bilateral infiltrates. ECMO cannulae in place. CXR 2/5 >> clearing bilateral infiltrates  Micro Data:  BCx2 12/14 >>negative  Sputum 12/15 >> OPF 12/28 blood > negative 12/29 > staph aureus 1/4 bronch:  1/2 acid fast smear bronch: negative 1/2 fungal cx bronch: negative1/12 BAL: serratia, staph epidermidisu 1/4 BAL > MSSA, Serratia 1/9 blood > negative 1/9 Resp culture > MSSA, serratia 1/11 serratia >  negative 1/12 fungus culture > negative 1/12 BAL > serratia 1/18 BAL >> rare Serratia. 1/24 blood > negative 1/30 blood > negative 1/30 resp culture > serratia 2/1 BAL >> rare Serratia - enterococcus 2/1 aspergillus Ag > negative 2/2 resp culture > serratia  2/2 blood > negative 2/5 urine > negative 2/9 resp: serratia 2/15 BAL serratia  Antimicrobials:  Received remdesivir, steroids, actemra and convalescent plasma  5 days cefepime 12/24-12/29 Cefepime 1/4->1/13 vanc 1/4> 1/11 Ceftriaxone 1/14 > 1/27 vanc x1 1/20 Cefazolin 1/29 > 2/2 cefuroxim 2/2 vanc 2/2 Ceftriaxone x1 2/4 Meropenem 2/15>>2/21 Tobramycin inhaled 2/15>>2/21  Interim history/subjective:   No fevers.  Tracheostomy in place on mechanical ventilatory support.  Objective   Blood pressure 138/73, pulse 74, temperature 98.2 F (36.8 C), temperature source Oral, resp. rate 20, height _0  (1.727 m), weight 134.2 kg, SpO2 99 %. CVP:  [8 mmHg-14 mmHg] 8 mmHg  Vent Mode: PSV;CPAP FiO2 (%):  [35 %-50 %] 40 % Set Rate:  [20 bmp] 20 bmp PEEP:  [8 cmH20-10 cmH20] 8 cmH20 Pressure Support:  [10 cmH20] 10 cmH20 Plateau Pressure:  [23 cmH20-35 cmH20] 23 cmH20   Intake/Output Summary (Last 24 hours) at 10/12/2019 0851 Last data filed at 10/12/2019 0700 Gross per 24 hour  Intake 2647.24 ml  Output 2220 ml  Net 427.24 ml   Filed Weights   10/10/19 0500 10/11/19 0627 10/12/19 0500  Weight: 135.2 kg 132.6 kg 134.2 kg    Examination: General appearance: 44 y.o., male, on mechanical ventilation Eyes: Tracking appropriately, reactive HENT: Tracheostomy tube in place Neck: Trach in place, bandage over cannulation site Lungs: Bilateral ventilated breath sounds CV: RRR, S1, S2, no  MRGs  Abdomen: Soft, obese nondistended Extremities: No significant edema, Skin: Foot bilateral lateral deep tissue soft injury Neuro: Alert following commands, weak    ABG    Component Value Date/Time   PHART 7.384 10/10/2019 1817    PCO2ART 63.7 (H) 10/10/2019 1817   PO2ART 76.0 (L) 10/10/2019 1817   HCO3 38.0 (H) 10/10/2019 1817   TCO2 40 (H) 10/10/2019 1817   ACIDBASEDEF 4.0 (H) 09/16/2019 2334   O2SAT 79.7 10/12/2019 0700     Resolved Hospital Problem list   Pneumothoraces-resolved.  Assessment & Plan:   ARDS due to COVID 19, requiring mechanical ventilation.  Decannulated from VV ECMO on 3/4 after 65 days. -Continue lung protective ventilation.  Patient changed today to pressure support trial. -Appears much more comfortable and CPAP pressure support with target tidal volumes in the low 500s. -Able to drop to pressure support 10/8.  Respiratory rate 18-20. -Patient feels comfortable and is able to follow basic commands. -Still overall weak. -Continue routine trach care -Sedation as required, decreasing continuous Precedex infusion.  Fever-unclear if this is cytokine response due to decannulation versus unidentified source of infection. -Possible SIRS event from post decannulation -This has cleared at this time. -Continue to observe -Remains on antimicrobials Vanco plus meropenem at least for 48 hours, reassess potentially Monday morning.  Discussed with cardiology.  Would feel better de-escalating or stopping once we have final culture results.  AKI with azotemia - improving  Positive cumulative fluid balance Hypernatremia has corrected Severe third spacing -Continue diuresis  Hypertension -Amlodipine, clonidine, propanolol, hydralazine -Pain and anxiety control to help maintain goals  Serratia pneumonia> treated.  Repeat bilateral BAL on 3/3 no growth.  Neuromuscular weakness -Continue aggressive PT OT  Bilateral lower feet deep tissue injury -Continue wound care  Anemia due to critical illness, increased RBC turnover -Continue conservative hemoglobin transfusion threshold.  Best practice:  Pain/Anxiety/Delirium protocol (if indicated): PRNs only VAP protocol (if indicated): Bundle in  place. Respiratory support goals: Continue current ultra lung protective strategy.  DVT prophylaxis: Heparin drip, goal 0.3-0.5 GI prophylaxis: PPI Nutrition: TF Fluid status goals:  See above Urinary catheter: Guide hemodynamic management Glucose control: controlled with current regimen Mobility/therapy needs: Bedrest.  Daily labs: as per ECMO protocol. Code Status: Full code  Disposition: ICU Family: Updated wife via phone  Labs   CBC: CBC Latest Ref Rng & Units 10/12/2019 10/11/2019 10/10/2019  WBC 4.0 - 10.5 K/uL 6.0 8.3 -  Hemoglobin 13.0 - 17.0 g/dL 7.8(L) 8.9(L) 9.9(L)  Hematocrit 39.0 - 52.0 % 27.1(L) 29.8(L) 29.0(L)  Platelets 150 - 400 K/uL 124(L) 135(L) -     Basic Metabolic Panel: BMP Latest Ref Rng & Units 10/12/2019 10/11/2019 10/10/2019  Glucose 70 - 99 mg/dL 140(H) 119(H) -  BUN 6 - 20 mg/dL 55(H) 41(H) -  Creatinine 0.61 - 1.24 mg/dL 0.68 0.62 -  Sodium 135 - 145 mmol/L 141 142 145  Potassium 3.5 - 5.1 mmol/L 3.9 4.2 3.9  Chloride 98 - 111 mmol/L 99 99 -  CO2 22 - 32 mmol/L 35(H) 34(H) -  Calcium 8.9 - 10.3 mg/dL 8.3(L) 8.6(L) -     GFR: Estimated Creatinine Clearance: 156.2 mL/min (by C-G formula based on SCr of 0.68 mg/dL). Recent Labs  Lab 10/09/19 0411 10/09/19 1631 10/10/19 0328 10/10/19 1808 10/11/19 0442 10/11/19 0450 10/12/19 0431  WBC 6.9   < > 6.2 9.4 8.3  --  6.0  LATICACIDVEN 1.1  --  0.9  --   --  1.2 1.2   < > =  values in this interval not displayed.    Liver Function Tests: Recent Labs  Lab 10/06/19 0353 10/07/19 0317 10/10/19 0328  ALBUMIN 1.9* 1.9* 1.8*   No results for input(s): LIPASE, AMYLASE in the last 168 hours. No results for input(s): AMMONIA in the last 168 hours.  ABG    Component Value Date/Time   PHART 7.384 10/10/2019 1817   PCO2ART 63.7 (H) 10/10/2019 1817   PO2ART 76.0 (L) 10/10/2019 1817   HCO3 38.0 (H) 10/10/2019 1817   TCO2 40 (H) 10/10/2019 1817   ACIDBASEDEF 4.0 (H) 09/16/2019 2334   O2SAT 79.7  10/12/2019 0700     Coagulation Profile: No results for input(s): INR, PROTIME in the last 168 hours.  Cardiac Enzymes: No results for input(s): CKTOTAL, CKMB, CKMBINDEX, TROPONINI in the last 168 hours.  HbA1C: Hgb A1c MFr Bld  Date/Time Value Ref Range Status  09/23/2019 08:58 AM 5.5 4.8 - 5.6 % Final    Comment:    (NOTE) Pre diabetes:          5.7%-6.4% Diabetes:              >6.4% Glycemic control for   <7.0% adults with diabetes   07/24/2019 04:50 AM 6.7 (H) 4.8 - 5.6 % Final    Comment:    (NOTE) Pre diabetes:          5.7%-6.4% Diabetes:              >6.4% Glycemic control for   <7.0% adults with diabetes     CBG: Recent Labs  Lab 10/11/19 1637 10/11/19 2033 10/11/19 2333 10/12/19 0330 10/12/19 0723  GLUCAP 163* 106* 118* 139* 113*    This patient is critically ill with multiple organ system failure; which, requires frequent high complexity decision making, assessment, support, evaluation, and titration of therapies. This was completed through the application of advanced monitoring technologies and extensive interpretation of multiple databases. During this encounter critical care time was devoted to patient care services described in this note for 42 minutes.  Garner Nash, DO [facility] 10/12/2019 8:51 AM

## 2019-10-12 NOTE — Progress Notes (Addendum)
4 Days Post-Op Procedure(s) (LRB): Cancelled Procedure - Patient not physiologically ready (N/A) Subjective: Patient remained stable off ECMO Fever has resolved and white count remains normal Pressure support trial this a.m. stopped after 1 hour with drop in O2 sats Patient on Lasix drip with stable hemodynamics No evidence of hematoma in the right neck at the cannulation site External Foley catheter in place for collection of urine with Lasix drip  Objective: Vital signs in last 24 hours: Temp:  [98.2 F (36.8 C)-98.7 F (37.1 C)] 98.6 F (37 C) (03/06 1133) Pulse Rate:  [71-93] 74 (03/06 0700) Cardiac Rhythm: Normal sinus rhythm (03/06 0800) Resp:  [19-33] 20 (03/06 0700) BP: (110-155)/(54-78) 138/73 (03/06 0700) SpO2:  [87 %-100 %] 99 % (03/06 0739) FiO2 (%):  [40 %-50 %] 40 % (03/06 1125) Weight:  [134.2 kg] 134.2 kg (03/06 0500)  Hemodynamic parameters for last 24 hours: CVP:  [8 mmHg-10 mmHg] 10 mmHg  Intake/Output from previous day: 03/05 0701 - 03/06 0700 In: 3037.4 [I.V.:503.6; IV Piggyback:1133.8] Out: 2420 [Urine:2420] Intake/Output this shift: No intake/output data recorded.  Eyes open and engaged Lungs clear Abdomen benign Extremities edematous and warm Generally weak  Lab Results: Recent Labs    10/11/19 0442 10/12/19 0431  WBC 8.3 6.0  HGB 8.9* 7.8*  HCT 29.8* 27.1*  PLT 135* 124*   BMET:  Recent Labs    10/11/19 0442 10/12/19 0431  NA 142 141  K 4.2 3.9  CL 99 99  CO2 34* 35*  GLUCOSE 119* 140*  BUN 41* 55*  CREATININE 0.62 0.68  CALCIUM 8.6* 8.3*    PT/INR: No results for input(s): LABPROT, INR in the last 72 hours. ABG    Component Value Date/Time   PHART 7.384 10/10/2019 1817   HCO3 38.0 (H) 10/10/2019 1817   TCO2 40 (H) 10/10/2019 1817   ACIDBASEDEF 4.0 (H) 09/16/2019 2334   O2SAT 79.7 10/12/2019 0700   CBG (last 3)  Recent Labs    10/12/19 0330 10/12/19 0723 10/12/19 1132  GLUCAP 139* 113* 145*     Assessment/Plan: S/P Procedure(s) (LRB): Cancelled Procedure - Patient not physiologically ready (N/A) Continue tube feeds, pressure support weans and diuresis   LOS: 82 days    [name] 10/12/2019

## 2019-10-12 NOTE — Progress Notes (Signed)
Patient ID: [name], male   DOB: [birth year], 44 y.o.   MRN: [id number]    Advanced Heart Failure Rounding Note   Subjective:    Decannulated 3/4. Had high fevers (to 104) after decannulation. Started on abx and steroids.   Now afebrile.   Remains on vent through trach. FiO2 50% Oxygenation ok.   CXR with diffuse infiltrates. Unchanged.   Awake following commands but very weak. Hgb 7.8. WBC 6k BCx still cooking   Objective:   Weight Range:  Vital Signs:   Temp:  [98.2 F (36.8 C)-101.5 F (38.6 C)] 98.2 F (36.8 C) (03/06 0725) Pulse Rate:  [71-109] 74 (03/06 0700) Resp:  [19-38] 20 (03/06 0700) BP: (110-155)/(54-78) 138/73 (03/06 0700) SpO2:  [87 %-100 %] 99 % (03/06 0739) Arterial Line BP: (164-167)/(68-73) 167/73 (03/05 1025) FiO2 (%):  [35 %-50 %] 40 % (03/06 0810) Weight:  [134.2 kg] 134.2 kg (03/06 0500) Last BM Date: 10/11/19  Weight change: Filed Weights   10/10/19 0500 10/11/19 0627 10/12/19 0500  Weight: 135.2 kg 132.6 kg 134.2 kg    Intake/Output:   Intake/Output Summary (Last 24 hours) at 10/12/2019 0825 Last data filed at 10/12/2019 0700 Gross per 24 hour  Intake 2647.24 ml  Output 2220 ml  Net 427.24 ml     Physical Exam : General:  Awake on trach follows commands HEENT: normal Neck: supple. + trach. RIJ cannula site ok . Carotids 2+ bilat; no bruits. No lymphadenopathy or thryomegaly appreciated. Cor: PMI nondisplaced. Regular rate & rhythm. No rubs, gallops or murmurs. Lungs: coarse Abdomen: obese soft, nontender, nondistended. No hepatosplenomegaly. No bruits or masses. Good bowel sounds. + PEG Extremities: no cyanosis, clubbing, rash, 3+ diffuseedema Neuro: Awake on vent follows commands weak    Telemetry: Sinus 70-80sPersonally reviewed   Labs: Basic Metabolic Panel: Recent Labs  Lab 10/08/19 0357 10/08/19 0421 10/09/19 0411 10/09/19 0420 10/09/19 1631 10/09/19 1641 10/10/19 0328 10/10/19 0328 10/10/19 8546  10/10/19 1556 10/10/19 1808 10/10/19 1817 10/11/19 0442 10/12/19 0431  NA 146*   < >  --    < > 142   < > 144  --    < > 144 144 145 142 141  K 4.9   < >  --    < > 4.5   < > 4.4  --    < > 4.1 4.0 3.9 4.2 3.9  CL 105  --   --    < > 101  --  102  --   --   --  102  --  99 99  CO2 33*  --   --    < > 33*  --  33*  --   --   --  33*  --  34* 35*  GLUCOSE 87  --   --    < > 109*  --  94  --   --   --  175*  --  119* 140*  BUN 66*  --   --    < > 66*  --  60*  --   --   --  52*  --  41* 55*  CREATININE 0.57*  --   --    < > 0.60*  --  0.61  --   --   --  0.66  --  0.62 0.68  CALCIUM 8.5*  --   --    < > 8.6*   < > 8.6*   < >  --   --  8.6*  --  8.6* 8.3*  MG 2.2  --  2.0  --   --   --  1.8  --   --   --   --   --  2.1 2.0  PHOS 3.6  --  3.9  --   --   --  4.0  --   --   --   --   --  4.0 4.1   < > = values in this interval not displayed.    Liver Function Tests: Recent Labs  Lab 10/06/19 0353 10/07/19 0317 10/10/19 0328  ALBUMIN 1.9* 1.9* 1.8*   No results for input(s): LIPASE, AMYLASE in the last 168 hours. No results for input(s): AMMONIA in the last 168 hours.  CBC: Recent Labs  Lab 10/09/19 1631 10/09/19 1641 10/10/19 0328 10/10/19 0548 10/10/19 1556 10/10/19 1808 10/10/19 1817 10/11/19 0442 10/12/19 0431  WBC 9.4  --  6.2  --   --  9.4  --  8.3 6.0  HGB 8.3*   < > 7.7*   < > 9.5* 9.1* 9.9* 8.9* 7.8*  HCT 29.0*   < > 26.6*   < > 28.0* 30.6* 29.0* 29.8* 27.1*  MCV 97.0  --  97.8  --   --  96.2  --  94.3 97.8  PLT 115*  --  98*  --   --  120*  --  135* 124*   < > = values in this interval not displayed.    Cardiac Enzymes: No results for input(s): CKTOTAL, CKMB, CKMBINDEX, TROPONINI in the last 168 hours.  BNP: BNP (last 3 results) Recent Labs    07/22/19 1039  BNP 15.3    ProBNP (last 3 results) No results for input(s): PROBNP in the last 8760 hours.    Other results:  Imaging: DG Abd 1 View  Result Date: 10/10/2019 CLINICAL DATA:  44 year  male with PEG tube placement. EXAM: ABDOMEN - 1 VIEW COMPARISON:  Abdominal radiograph dated 08/29/2019. FINDINGS: Percutaneous gastrostomy with tip in the left upper abdomen. Contrast injected via tube opacifies the stomach and duodenal C-loop. No extraluminal spillage of contrast identified. IMPRESSION: Gastrostomy tube over the left upper abdomen. Electronically Signed   By: Anner Crete M.D.   On: 10/10/2019 18:31   DG CHEST PORT 1 VIEW  Result Date: 10/11/2019 CLINICAL DATA:  Respiratory failure.  Off ECMO since yesterday. EXAM: PORTABLE CHEST 1 VIEW COMPARISON:  1 day prior FINDINGS: Right-sided PICC line tip at low SVC. Tracheostomy appropriately positioned. Removal of ECMO cannula. Normal heart size for level of inspiration. No pleural effusion or pneumothorax. Minimal improvement in right-sided interstitial and airspace disease. Relatively similar left-sided interstitial and airspace opacities. IMPRESSION: Minimally improved right-sided aeration. Otherwise similar interstitial and airspace disease which could represent infection, edema, and/or ARDS. Electronically Signed   By: Abigail Miyamoto M.D.   On: 10/11/2019 08:52   VAS [place] LOWER EXTREMITY VENOUS (DVT)  Result Date: 10/11/2019  Lower Venous DVTStudy Indications: Swelling, and s/p COVID infection with respiratory complications.  Comparison Study: No prior study Performing Technologist: Maudry Mayhew MHA, RDMS, RVT, RDCS  Examination Guidelines: A complete evaluation includes B-mode imaging, spectral Doppler, color Doppler, and power Doppler as needed of all accessible portions of each vessel. Bilateral testing is considered an integral part of a complete examination. Limited examinations for reoccurring indications may be performed as noted. The reflux portion of the exam is performed with the patient in reverse Trendelenburg.  +---------+---------------+---------+-----------+----------+--------------+ RIGHT  CompressibilityPhasicitySpontaneityPropertiesThrombus Aging +---------+---------------+---------+-----------+----------+--------------+ CFV      Full           Yes      Yes                                 +---------+---------------+---------+-----------+----------+--------------+ SFJ      Full                                                        +---------+---------------+---------+-----------+----------+--------------+ FV Prox  Full                                                        +---------+---------------+---------+-----------+----------+--------------+ FV Mid   Full                                                        +---------+---------------+---------+-----------+----------+--------------+ FV DistalFull                                                        +---------+---------------+---------+-----------+----------+--------------+ POP      Full           Yes      Yes                                 +---------+---------------+---------+-----------+----------+--------------+ PTV      Full                                                        +---------+---------------+---------+-----------+----------+--------------+ PERO     Full                                                        +---------+---------------+---------+-----------+----------+--------------+   +---------+---------------+---------+-----------+----------+--------------+ LEFT     CompressibilityPhasicitySpontaneityPropertiesThrombus Aging +---------+---------------+---------+-----------+----------+--------------+ CFV      Full           Yes      Yes                                 +---------+---------------+---------+-----------+----------+--------------+ SFJ      Full                                                        +---------+---------------+---------+-----------+----------+--------------+  FV Prox  Full                                                         +---------+---------------+---------+-----------+----------+--------------+ FV Mid   Full                                                        +---------+---------------+---------+-----------+----------+--------------+ FV DistalFull                                                        +---------+---------------+---------+-----------+----------+--------------+ POP      Full           Yes      Yes                                 +---------+---------------+---------+-----------+----------+--------------+ PTV      Full                                                        +---------+---------------+---------+-----------+----------+--------------+ PERO     Full                                                        +---------+---------------+---------+-----------+----------+--------------+     Summary: RIGHT: - There is no evidence of deep vein thrombosis in the lower extremity. However, portions of this examination were limited- see technologist comments above.  - No cystic structure found in the popliteal fossa.  LEFT: - There is no evidence of deep vein thrombosis in the lower extremity. However, portions of this examination were limited- see technologist comments above.  - No cystic structure found in the popliteal fossa.  *See table(s) above for measurements and observations. Electronically signed by Monica Martinez MD on 10/11/2019 at 4:40:27 PM.    Final    VAS [place] UPPER EXTREMITY VENOUS DUPLEX  Result Date: 10/11/2019 UPPER VENOUS STUDY  Indications: Swelling Comparison Study: No prior study Performing Technologist: Maudry Mayhew MHA, RDMS, RVT, RDCS  Examination Guidelines: A complete evaluation includes B-mode imaging, spectral Doppler, color Doppler, and power Doppler as needed of all accessible portions of each vessel. Bilateral testing is considered an integral part of a complete examination. Limited examinations for reoccurring indications may  be performed as noted.  Right Findings: +----------+------------+---------+-----------+----------+-------+ RIGHT     CompressiblePhasicitySpontaneousPropertiesSummary +----------+------------+---------+-----------+----------+-------+ Subclavian    Full       Yes       Yes                      +----------+------------+---------+-----------+----------+-------+ Axillary      Full  Yes       Yes                      +----------+------------+---------+-----------+----------+-------+ Brachial      Full       Yes       Yes                      +----------+------------+---------+-----------+----------+-------+ Radial        Full                                          +----------+------------+---------+-----------+----------+-------+ Ulnar         Full                                          +----------+------------+---------+-----------+----------+-------+ Cephalic      Full                                          +----------+------------+---------+-----------+----------+-------+  Left Findings: +----------+------------+---------+-----------+----------+-------+ LEFT      CompressiblePhasicitySpontaneousPropertiesSummary +----------+------------+---------+-----------+----------+-------+ Subclavian    Full       Yes       Yes                      +----------+------------+---------+-----------+----------+-------+ Axillary      Full       Yes       Yes                      +----------+------------+---------+-----------+----------+-------+ Brachial      Full       Yes       Yes                      +----------+------------+---------+-----------+----------+-------+ Radial        Full                                          +----------+------------+---------+-----------+----------+-------+ Ulnar         Full                                          +----------+------------+---------+-----------+----------+-------+ Cephalic       Full                                          +----------+------------+---------+-----------+----------+-------+  Summary:  Right: No evidence of deep vein thrombosis in the upper extremity. No evidence of superficial vein thrombosis in the upper extremity. This study was limited.  Left: No evidence of deep vein thrombosis in the upper extremity. No evidence of superficial vein thrombosis in the upper extremity. This study was limited.  *See table(s) above for measurements and observations.  Diagnosing physician: Monica Martinez MD Electronically signed by Monica Martinez MD on 10/11/2019 at 4:40:03 PM.    Final  Medications:     Scheduled Medications: . sodium chloride   Intravenous Once  . sodium chloride   Intravenous Once  . acetaminophen (TYLENOL) oral liquid 160 mg/5 mL  650 mg Per Tube Q6H  . amiodarone  200 mg Per Tube Daily  . amLODipine  5 mg Per Tube Daily  . vitamin C  500 mg Per Tube Daily  . chlorhexidine gluconate (MEDLINE KIT)  15 mL Mouth Rinse BID  . Chlorhexidine Gluconate Cloth  6 each Topical Daily  . cloNIDine  0.2 mg Per Tube Q6H  . diatrizoate meglumine-sodium  30 mL Oral Once  . enoxaparin (LOVENOX) injection  60 mg Subcutaneous Q24H  . feeding supplement (OSMOLITE 1.5 CAL)  237 mL Per Tube 6 X Daily  . feeding supplement (PRO-STAT SUGAR FREE 64)  60 mL Per Tube QID  . free water  300 mL Per Tube Q6H  . Gerhardt's butt cream   Topical QID  . hydrALAZINE  100 mg Per Tube Q8H  . hydrocortisone sod succinate (SOLU-CORTEF) inj  50 mg Intravenous Q6H  . insulin aspart  0-20 Units Subcutaneous Q4H  . insulin aspart  5 Units Subcutaneous Q4H  . insulin detemir  12 Units Subcutaneous BID  . ipratropium-albuterol  3 mL Nebulization Q6H  . magic mouthwash  5 mL Oral TID  . mouth rinse  15 mL Mouth Rinse 10 times per day  . montelukast  10 mg Per Tube QHS  . nutrition supplement (JUVEN)  1 packet Per Tube BID BM  . pantoprazole sodium  40 mg Per Tube BID  .  propranolol  80 mg Per Tube TID  . psyllium  1 packet Per Tube BID  . QUEtiapine  50 mg Per Tube QHS  . sodium chloride flush  10-40 mL Intracatheter Q12H  . testosterone  5 g Transdermal Daily  . white petrolatum   Topical BID    Infusions: . sodium chloride    . sodium chloride 250 mL (10/08/19 1626)  . sodium chloride Stopped (10/12/19 0649)  . dexmedetomidine (PRECEDEX) IV infusion 0.5 mcg/kg/hr (10/12/19 0700)  . meropenem (MERREM) IV 200 mL/hr at 10/12/19 0700  . vancomycin Stopped (10/12/19 0117)    PRN Medications: Place/Maintain arterial line **AND** sodium chloride, sodium chloride, sodium chloride, acetaminophen (TYLENOL) oral liquid 160 mg/5 mL, acetaminophen, [COMPLETED] diazepam **FOLLOWED BY** diazepam, hydrALAZINE, HYDROmorphone, magnesium hydroxide, midazolam, ondansetron (ZOFRAN) IV, senna-docusate, sodium chloride, sodium chloride flush   Assessment/Plan:    1. Acute hypoxic/hypercarbic respiratory failure due to COVID PNA: Remained markedly hypoxic despite full support. Intubated 12/26. S/p bilateral CTs 12/26 for pneumothoraces.  TEE on 12/26 LVEF 70% RV ok. VV ECMO begun 12/26. Tracheostomy 12/29.  COVID ARDS at this point. ECMO circuit changed 1/8 & 1/23. Crescent cannula placed 2/2 (femoral cannuals removed) - Decannulated 3/4 - Bronch 3/3 looked great.  - CXR unchanged with diffuse infiltrates - With post-cannulation fevers recultured and restarted abx and start steroids for possible post-decannulation SIRS response. On 3/5. Fevers down. Continue. Cultures cooking - Completed 14 days  of meropenum on 2/28. Restarted 3/5 - Volume overloaded. With anasarca. Start lasix gtt at 8 - DVT dose heparin  2. Bilateral PTX: Due to barotrauma.  - Resolved. CTs out  3. COVID PNA: management of COVID infection per CCM. CXR with bilateral multifocal PNA progressing to likely ARDS.  - s/p 10 days of remdesivir - 12/16, 12/2 s/p tociluzimab - 12/17 s/p convalescent  plasma - Decadron at 64m/daily completed 1/11 - Completed  cefepime/vanc - Off Ancef  4. ID: Empiric coverage with vancomycin/cefepime initially for possible secondary bacterial PNA, course completed.   - treated with cefepime and vanc for + cx with staph epidimidis, MSSA and serratia (sensitivities ok). - developed recurrent PNA and sepsis 2/15 - BAL with > 100K serratia. BAL growing > 100K serratia. Completed 14 days mero on 2/28 - Repeat bronch 3/3 looked good  - With fevers will reculture, restarted abx and start steroids for possible post-decannulation SIRS response on 3/5. Responding well. Continue for now   5. Anemia: - hgb 7.8. Follow  6. FEN - now with G-tube.Continue TFs  8. Hypernatremia - Na 147 -> 146 -> 142 -> 141 - Continue FW at current dose. May be able to decrease soon  9. AKI - Resolved. Volume status up. -> lasix gtt  10. DTIs - WOC following. Has evolving wound on left 5th toe. Continue to follow - No change   Length of Stay: 60  CRITICAL CARE Performed by: Glori Bickers  Total critical care time: 35 minutes  Critical care time was exclusive of separately billable procedures and treating other patients.  Critical care was necessary to treat or prevent imminent or life-threatening deterioration.  Critical care was time spent personally by me (independent of midlevel providers or residents) on the following activities: development of treatment plan with patient and/or surrogate as well as nursing, discussions with consultants, evaluation of patient's response to treatment, examination of patient, obtaining history from patient or surrogate, ordering and performing treatments and interventions, ordering and review of laboratory studies, ordering and review of radiographic studies, pulse oximetry and re-evaluation of patient's condition.    Glori Bickers MD 10/12/2019, 8:25 AM  Advanced Heart Failure Team Pager [phone] (M-F; [place])  Please  contact [facility] for night-coverage after hours (4p -7a ) and weekends on [email or web address]

## 2019-10-12 NOTE — Plan of Care (Signed)
  Problem: Respiratory: Goal: Ability to maintain a clear airway and adequate ventilation will improve Outcome: Progressing   Problem: Role Relationship: Goal: Method of communication will improve Outcome: Progressing   Problem: Clinical Measurements: Goal: Ability to maintain clinical measurements within normal limits will improve Outcome: Progressing   Problem: Nutrition: Goal: Adequate nutrition will be maintained Outcome: Progressing   Problem: Coping: Goal: Level of anxiety will decrease Outcome: Progressing   Problem: Elimination: Goal: Will not experience complications related to bowel motility Outcome: Progressing Goal: Will not experience complications related to urinary retention Outcome: Progressing   Problem: Pain Managment: Goal: General experience of comfort will improve Outcome: Progressing

## 2019-10-13 ENCOUNTER — Inpatient Hospital Stay (HOSPITAL_COMMUNITY): Payer: Managed Care, Other (non HMO)

## 2019-10-13 LAB — CBC
HCT: 29 % — ABNORMAL LOW (ref 39.0–52.0)
Hemoglobin: 8.4 g/dL — ABNORMAL LOW (ref 13.0–17.0)
MCH: 27.7 pg (ref 26.0–34.0)
MCHC: 29 g/dL — ABNORMAL LOW (ref 30.0–36.0)
MCV: 95.7 fL (ref 80.0–100.0)
Platelets: 184 10*3/uL (ref 150–400)
RBC: 3.03 MIL/uL — ABNORMAL LOW (ref 4.22–5.81)
RDW: 17.2 % — ABNORMAL HIGH (ref 11.5–15.5)
WBC: 8.6 10*3/uL (ref 4.0–10.5)
nRBC: 0 % (ref 0.0–0.2)

## 2019-10-13 LAB — GLUCOSE, CAPILLARY
Glucose-Capillary: 100 mg/dL — ABNORMAL HIGH (ref 70–99)
Glucose-Capillary: 110 mg/dL — ABNORMAL HIGH (ref 70–99)
Glucose-Capillary: 145 mg/dL — ABNORMAL HIGH (ref 70–99)
Glucose-Capillary: 89 mg/dL (ref 70–99)
Glucose-Capillary: 89 mg/dL (ref 70–99)
Glucose-Capillary: 96 mg/dL (ref 70–99)

## 2019-10-13 LAB — COOXEMETRY PANEL
Carboxyhemoglobin: 3.6 % — ABNORMAL HIGH (ref 0.5–1.5)
Methemoglobin: 1.1 % (ref 0.0–1.5)
O2 Saturation: 83.4 %
Total hemoglobin: 8.9 g/dL — ABNORMAL LOW (ref 12.0–16.0)

## 2019-10-13 LAB — BASIC METABOLIC PANEL
Anion gap: 10 (ref 5–15)
BUN: 55 mg/dL — ABNORMAL HIGH (ref 6–20)
CO2: 39 mmol/L — ABNORMAL HIGH (ref 22–32)
Calcium: 8.4 mg/dL — ABNORMAL LOW (ref 8.9–10.3)
Chloride: 94 mmol/L — ABNORMAL LOW (ref 98–111)
Creatinine, Ser: 0.64 mg/dL (ref 0.61–1.24)
GFR calc Af Amer: >60 mL/min (ref >60)
GFR calc non Af Amer: >60 mL/min (ref >60)
Glucose, Bld: 114 mg/dL — ABNORMAL HIGH (ref 70–99)
Potassium: 3.3 mmol/L — ABNORMAL LOW (ref 3.5–5.1)
Sodium: 143 mmol/L (ref 135–145)

## 2019-10-13 LAB — PHOSPHORUS: Phosphorus: 3.2 mg/dL (ref 2.5–4.6)

## 2019-10-13 LAB — MAGNESIUM: Magnesium: 1.7 mg/dL (ref 1.7–2.4)

## 2019-10-13 MED ORDER — CLONIDINE HCL 0.2 MG PO TABS
0.2000 mg | ORAL_TABLET | Freq: Two times a day (BID) | ORAL | Status: DC
  Administered 2019-10-14 – 2019-10-16 (×6): 0.2 mg
  Filled 2019-10-13 (×6): qty 1

## 2019-10-13 MED ORDER — VANCOMYCIN HCL IN DEXTROSE 1-5 GM/200ML-% IV SOLN
1000.0000 mg | Freq: Three times a day (TID) | INTRAVENOUS | Status: DC
  Administered 2019-10-13 – 2019-10-14 (×4): 1000 mg via INTRAVENOUS
  Filled 2019-10-13 (×5): qty 200

## 2019-10-13 MED ORDER — POTASSIUM CHLORIDE 10 MEQ/50ML IV SOLN
10.0000 meq | INTRAVENOUS | Status: AC
  Administered 2019-10-13 (×6): 10 meq via INTRAVENOUS
  Filled 2019-10-13 (×6): qty 50

## 2019-10-13 MED ORDER — MAGNESIUM SULFATE 50 % IJ SOLN
3.0000 g | Freq: Once | INTRAVENOUS | Status: AC
  Administered 2019-10-13: 3 g via INTRAVENOUS
  Filled 2019-10-13: qty 6

## 2019-10-13 MED ORDER — ALBUTEROL SULFATE (2.5 MG/3ML) 0.083% IN NEBU
2.5000 mg | INHALATION_SOLUTION | RESPIRATORY_TRACT | Status: DC | PRN
  Administered 2019-10-24 – 2019-11-04 (×4): 2.5 mg via RESPIRATORY_TRACT
  Filled 2019-10-13 (×6): qty 3

## 2019-10-13 MED ORDER — IOHEXOL 300 MG/ML  SOLN
80.0000 mL | Freq: Once | INTRAMUSCULAR | Status: AC | PRN
  Administered 2019-10-13: 60 mL via INTRAVENOUS

## 2019-10-13 MED ORDER — VANCOMYCIN HCL IN DEXTROSE 1-5 GM/200ML-% IV SOLN: 1000.0000 mg | Freq: Three times a day (TID) | INTRAVENOUS | Status: DC

## 2019-10-13 MED ORDER — MAGNESIUM SULFATE 50 % IJ SOLN
3.0000 g | Freq: Once | INTRAVENOUS | Status: DC
  Filled 2019-10-13: qty 6

## 2019-10-13 MED ORDER — PROPRANOLOL HCL 20 MG/5ML PO SOLN
40.0000 mg | Freq: Three times a day (TID) | ORAL | Status: DC
  Administered 2019-10-14 – 2019-10-15 (×3): 40 mg
  Filled 2019-10-13 (×7): qty 10

## 2019-10-13 MED ORDER — SODIUM CHLORIDE 0.9 % IV SOLN
2.0000 g | Freq: Three times a day (TID) | INTRAVENOUS | Status: AC
  Administered 2019-10-13 – 2019-10-20 (×23): 2 g via INTRAVENOUS
  Filled 2019-10-13 (×27): qty 2

## 2019-10-13 MED ORDER — IPRATROPIUM-ALBUTEROL 0.5-2.5 (3) MG/3ML IN SOLN
3.0000 mL | Freq: Three times a day (TID) | RESPIRATORY_TRACT | Status: DC
  Administered 2019-10-13 – 2019-11-02 (×60): 3 mL via RESPIRATORY_TRACT
  Filled 2019-10-13 (×26): qty 3
  Filled 2019-10-13: qty 6
  Filled 2019-10-13 (×35): qty 3

## 2019-10-13 NOTE — Progress Notes (Signed)
   Palliative Medicine Inpatient Follow Up Note   Spoke to [place] via telephone. She was quite hopeful and remains optimistic about the future of Allie's health.  She had a variety of questions concerns insurance and whether or not she should enroll in [place]. I shared with her that this is not my area of knowledge. We discussed asking social work and the financial team to reach out to her for additional guidance.  [name] requested that the Palliative service complete additional FMLA paperwork. Paperwork has been obtained and will be completed by [facility] our Palliative Care Specialist, RN on Monday.  Time Spent: 15 Greater than 50% of the time was spent in counseling and coordination of care ______________________________________________________________________________________ [place] Team Team Cell Phone: [phone] Please utilize secure chat with additional questions, if there is no response within 30 minutes please call the above phone number  Palliative Medicine Team providers are available by phone from 7am to 7pm daily and can be reached through the team cell phone.  Should this patient require assistance outside of these hours, please call the patient's attending physician.

## 2019-10-13 NOTE — Plan of Care (Signed)
  Problem: Respiratory: Goal: Complications related to the disease process, condition or treatment will be avoided or minimized Outcome: Progressing   Problem: Clinical Measurements: Goal: Respiratory complications will improve Outcome: Progressing Goal: Cardiovascular complication will be avoided Outcome: Progressing   Problem: Activity: Goal: Risk for activity intolerance will decrease Outcome: Progressing

## 2019-10-13 NOTE — Progress Notes (Signed)
NAME:  [name], MRN:  [id number], DOB:  1975/08/18, LOS: 69 ADMISSION DATE:  07/22/2019, CONSULTATION DATE:  12/24 REFERRING MD:  Sloan Leiter, CHIEF COMPLAINT:  Dyspnea   Brief History   44 y/o male admitted 12/14 with COVID pneumonia leading to ARDS, pneumomediastinum.  Intubated 12/26, bilateral chest tubes placed.  Past Medical History  GERD HTN Asthma  Significant Hospital Events   12/14 admit with hypoxemic respiratory failure in setting of COVID-19 pneumonia 12/23 noted to have pneumomediastinum on chest x-ray 12/24PCCM consulted for evaluation 12/26 worsening hypoxemia, intubated 12/27 VV fem/fem ECMO cannulation 12/29 tracheostomy >> See bedside nursing event notes for full rundown of procedures after 12/27 1/5 epistaxis, nasal packing 1/30 improved cxr, improved lung compliance, TV 350s on 15PC  1/31 CXR increased infiltrates, +CFB     2/2 Converted to right IJ Crescent cannula. 2/3 PEG tube inserted at bedside 2/14 30 min sweep trial> paO2 105 (18 PEEP, 60% FiO2) pH 7.22, pCO2 87 2/20 2 hours sweep trial 2/21 1.5 hour sweep trial 2/24 7-hour sweep trial.  Increasingly awake. 3/2 - decannulation attempt aborted due to increased hypercapnia when sedated for procedure  Consults:  PCCM, CTS, CHF, Palliative  Procedures:  PICC 1/3 >> R radial a-line 1/20 >> R IJ ECMO cath 2/3 >> Tracheostomy 12/29 >>  Significant Diagnostic Tests:  CT chest 12/22>>extensive pneumomediastinum, multifocal patchy bilateral groundglass opacities CXR 1/25 >> improving bilateral infiltrates. ECMO cannulae in place. CXR 2/5 >> clearing bilateral infiltrates  Micro Data:  BCx2 12/14 >>negative  Sputum 12/15 >> OPF 12/28 blood > negative 12/29 > staph aureus 1/4 bronch:  1/2 acid fast smear bronch: negative 1/2 fungal cx bronch: negative1/12 BAL: serratia, staph epidermidisu 1/4 BAL > MSSA, Serratia 1/9 blood > negative 1/9 Resp culture > MSSA, serratia 1/11 serratia >  negative 1/12 fungus culture > negative 1/12 BAL > serratia 1/18 BAL >> rare Serratia. 1/24 blood > negative 1/30 blood > negative 1/30 resp culture > serratia 2/1 BAL >> rare Serratia - enterococcus 2/1 aspergillus Ag > negative 2/2 resp culture > serratia  2/2 blood > negative 2/5 urine > negative 2/9 resp: serratia 2/15 BAL serratia  Antimicrobials:  Received remdesivir, steroids, actemra and convalescent plasma  5 days cefepime 12/24-12/29 Cefepime 1/4->1/13 vanc 1/4> 1/11 Ceftriaxone 1/14 > 1/27 vanc x1 1/20 Cefazolin 1/29 > 2/2 cefuroxim 2/2 vanc 2/2 Ceftriaxone x1 2/4 Meropenem 2/15>>2/21 Tobramycin inhaled 2/15>>2/21  Interim history/subjective:   No fevers overnight.  Tracheostomy in place on mechanical support critically ill in the ICU  Objective   Blood pressure 122/71, pulse 86, temperature 99.2 F (37.3 C), temperature source Oral, resp. rate 20, height _0  (1.727 m), weight 131.7 kg, SpO2 97 %. CVP:  [6 mmHg-10 mmHg] 6 mmHg  Vent Mode: PSV;CPAP FiO2 (%):  [40 %] 40 % Set Rate:  [20 bmp] 20 bmp PEEP:  [8 cmH20-10 cmH20] 8 cmH20 Pressure Support:  [10 cmH20] 10 cmH20   Intake/Output Summary (Last 24 hours) at 10/13/2019 0743 Last data filed at 10/13/2019 0700 Gross per 24 hour  Intake 3792.8 ml  Output 4300 ml  Net -507.2 ml   Filed Weights   10/11/19 0627 10/12/19 0500 10/13/19 0434  Weight: 132.6 kg 134.2 kg 131.7 kg    Examination: General appearance: 44 y.o., male, tracheostomy tube dependent on mechanical support Eyes: Tracking appropriately eyes reactive HENT: Tracheostomy tube in place Neck: Bandage over right cannulation site Lungs: Bilateral mechanical ventilated breath sounds, good tidal volumes and pressure support  CV: Regular rate rhythm, S1-S2 no MRG Abdomen: Soft obese pannus, PEG tube site around insertion appears clear there is a firm indurated area along the right lateral border Extremities: No significant edema Skin: Foot  with bilateral deep tissue injury Neuro: Alert following commands    ABG    Component Value Date/Time   PHART 7.384 10/10/2019 1817   PCO2ART 63.7 (H) 10/10/2019 1817   PO2ART 76.0 (L) 10/10/2019 1817   HCO3 38.0 (H) 10/10/2019 1817   TCO2 40 (H) 10/10/2019 1817   ACIDBASEDEF 4.0 (H) 09/16/2019 2334   O2SAT 83.4 10/13/2019 0328     Resolved Hospital Problem list   Pneumothoraces-resolved.  Assessment & Plan:   ARDS due to COVID 19, requiring mechanical ventilation.  Decannulated from VV ECMO on 3/4 after 65 days. -Continue weaning from mechanical support. -Patient tolerated pressure support trial for approximately hour and a half yesterday. -He started to become more tachypneic and dropped his sats decision made to place him back on support. -Again this morning patient and CPAP pressure support.  Doing well today at 10/8.  Maintaining respiratory rate in low 16-20. -Continue routine trach care -Continue Precedex as needed.  Fever-unclear if this is cytokine response due to decannulation versus unidentified source of infection. -Possible SIRS response post decannulation -Unclear at this time of its etiology -Remains afebrile, discussed with cardiology yesterday plans to leave antimicrobials until cultures finalize on Monday.  Indurated area of the skin right lateral PEG tube. -CT surgery has been notified.  They are coming to evaluate the area today. -Can consider abdominal imaging if needed.  AKI with azotemia - improving  Positive cumulative fluid balance Hypernatremia has corrected Severe third spacing -Continue diuresis to maintain euvolemia  Hypertension -Amlodipine, clonidine, propanolol, hydralazine -As needed pain and anxiety control.  Serratia pneumonia> treated.  Repeat bilateral BAL on 3/3 no growth.  Neuromuscular weakness -Continue aggressive PT OT -Passive range of motion in bed as much as possible.  Bilateral lower feet deep tissue  injury -Continue wound care  Anemia due to critical illness, increased RBC turnover -Conservative hemoglobin transfusion threshold  Best practice:  Pain/Anxiety/Delirium protocol (if indicated): PRNs only VAP protocol (if indicated): Bundle in place. Respiratory support goals: Continue current ultra lung protective strategy.  DVT prophylaxis: Heparin drip, goal 0.3-0.5 GI prophylaxis: PPI Nutrition: TF Fluid status goals:  See above Urinary catheter: Guide hemodynamic management Glucose control: controlled with current regimen Mobility/therapy needs: Bedrest.  Daily labs: as per ECMO protocol. Code Status: Full code  Disposition: ICU Family: Updated wife via phone  Labs   CBC: CBC Latest Ref Rng & Units 10/13/2019 10/12/2019 10/11/2019  WBC 4.0 - 10.5 K/uL 8.6 6.0 8.3  Hemoglobin 13.0 - 17.0 g/dL 8.4(L) 7.8(L) 8.9(L)  Hematocrit 39.0 - 52.0 % 29.0(L) 27.1(L) 29.8(L)  Platelets 150 - 400 K/uL 184 124(L) 135(L)     Basic Metabolic Panel: BMP Latest Ref Rng & Units 10/13/2019 10/12/2019 10/11/2019  Glucose 70 - 99 mg/dL 114(H) 140(H) 119(H)  BUN 6 - 20 mg/dL 55(H) 55(H) 41(H)  Creatinine 0.61 - 1.24 mg/dL 0.64 0.68 0.62  Sodium 135 - 145 mmol/L 143 141 142  Potassium 3.5 - 5.1 mmol/L 3.3(L) 3.9 4.2  Chloride 98 - 111 mmol/L 94(L) 99 99  CO2 22 - 32 mmol/L 39(H) 35(H) 34(H)  Calcium 8.9 - 10.3 mg/dL 8.4(L) 8.3(L) 8.6(L)     GFR: Estimated Creatinine Clearance: 154.5 mL/min (by C-G formula based on SCr of 0.64 mg/dL). Recent Labs  Lab 10/09/19 0411  10/09/19 1631 10/10/19 0328 10/10/19 0328 10/10/19 1808 10/11/19 0442 10/11/19 0450 10/12/19 0431 10/13/19 0351  WBC 6.9   < > 6.2   < > 9.4 8.3  --  6.0 8.6  LATICACIDVEN 1.1  --  0.9  --   --   --  1.2 1.2  --    < > = values in this interval not displayed.    Liver Function Tests: Recent Labs  Lab 10/07/19 0317 10/10/19 0328  ALBUMIN 1.9* 1.8*   No results for input(s): LIPASE, AMYLASE in the last 168 hours. No  results for input(s): AMMONIA in the last 168 hours.  ABG    Component Value Date/Time   PHART 7.384 10/10/2019 1817   PCO2ART 63.7 (H) 10/10/2019 1817   PO2ART 76.0 (L) 10/10/2019 1817   HCO3 38.0 (H) 10/10/2019 1817   TCO2 40 (H) 10/10/2019 1817   ACIDBASEDEF 4.0 (H) 09/16/2019 2334   O2SAT 83.4 10/13/2019 0328     Coagulation Profile: No results for input(s): INR, PROTIME in the last 168 hours.  Cardiac Enzymes: No results for input(s): CKTOTAL, CKMB, CKMBINDEX, TROPONINI in the last 168 hours.  HbA1C: Hgb A1c MFr Bld  Date/Time Value Ref Range Status  09/23/2019 08:58 AM 5.5 4.8 - 5.6 % Final    Comment:    (NOTE) Pre diabetes:          5.7%-6.4% Diabetes:              >6.4% Glycemic control for   <7.0% adults with diabetes   07/24/2019 04:50 AM 6.7 (H) 4.8 - 5.6 % Final    Comment:    (NOTE) Pre diabetes:          5.7%-6.4% Diabetes:              >6.4% Glycemic control for   <7.0% adults with diabetes     CBG: Recent Labs  Lab 10/12/19 1132 10/12/19 1553 10/12/19 1939 10/12/19 2315 10/13/19 0311  GLUCAP 145* 116* 143* 89 96    This patient is critically ill with multiple organ system failure; which, requires frequent high complexity decision making, assessment, support, evaluation, and titration of therapies. This was completed through the application of advanced monitoring technologies and extensive interpretation of multiple databases. During this encounter critical care time was devoted to patient care services described in this note for 44 minutes.  Garner Nash, DO [facility] 10/13/2019 7:43 AM

## 2019-10-13 NOTE — Plan of Care (Signed)
Pt is alert, follows commands, and is able to word out words. A picture sheet is also available in the room to help pt point out his needs. Pt has been restless throughout the night. Pt's external catheter and bed linen were changed a few different times. Pt has voided very well, condom cath is now in place. Pt has generalized weakness but is able to move his hands and feet. Pt did tolerate medications thru his peg tube in the beginning of the night by pushing them very slowly, after about 500ML of fluid, it was more difficult  to push his medications through the peg tube, and the pt grimaced. Pt's PICC is patent and running. Pt has tolerated 40% fi 02 well. Call bell is within reach and bed is in lowest position. Problem: Respiratory: Goal: Complications related to the disease process, condition or treatment will be avoided or minimized Outcome: Progressing   Problem: Respiratory: Goal: Ability to maintain a clear airway and adequate ventilation will improve Outcome: Progressing   Problem: Role Relationship: Goal: Method of communication will improve Outcome: Progressing   Problem: Clinical Measurements: Goal: Respiratory complications will improve Outcome: Progressing

## 2019-10-13 NOTE — Progress Notes (Signed)
Pt with questionable PEG tube dislodged. CT confirms there is a tract. I attempted to place a 14 and 16Fr foley into the tract without success. Would rec IR replacement of feeding tube.

## 2019-10-13 NOTE — Progress Notes (Signed)
Trach sutures removed per MD order. Trach secured by trach ties.

## 2019-10-13 NOTE — Progress Notes (Signed)
Patient ID: [name], male   DOB: Feb 08, 1976, 44 y.o.   MRN: [id number]    Advanced Heart Failure Rounding Note   Subjective:    Decannulated 3/4. Had high fevers (to 104) after decannulation. Started on abx and steroids.   Awake. Following commands.  Having exquisite pain around his G-tube site.  Low-grade fevers.  White count stable at 8.6.  He is on a Lasix drip.  He has diuresed well.  Weight down 5 pounds.  Potassium being replaced.   Objective:   Weight Range:  Vital Signs:   Temp:  [98.6 F (37 C)-99.6 F (37.6 C)] 99.5 F (37.5 C) (03/07 0757) Pulse Rate:  [76-109] 89 (03/07 0800) Resp:  [16-26] 26 (03/07 0800) BP: (101-140)/(56-80) 131/74 (03/07 0800) SpO2:  [90 %-100 %] 97 % (03/07 0800) FiO2 (%):  [40 %] 40 % (03/07 0723) Weight:  [131.7 kg] 131.7 kg (03/07 0434) Last BM Date: 10/12/19  Weight change: Filed Weights   10/11/19 0627 10/12/19 0500 10/13/19 0434  Weight: 132.6 kg 134.2 kg 131.7 kg    Intake/Output:   Intake/Output Summary (Last 24 hours) at 10/13/2019 0909 Last data filed at 10/13/2019 0700 Gross per 24 hour  Intake 3579.2 ml  Output 4300 ml  Net -720.8 ml     Physical Exam : General: On trach collar.  Mildly diaphoretic.  No resp difficulty HEENT: normal Neck: supple.  Right IJ cannulation site looks okay with mild maceration.  I remove dressing personally.  Carotids 2+ bilat; no bruits. No lymphadenopathy or thryomegaly appreciated. Cor: PMI nondisplaced. Regular rate & rhythm. No rubs, gallops or murmurs. Lungs: clear Abdomen: Obese soft, there is significant induration around G-tube site.  This is quite tender.  Nondistended. No hepatosplenomegaly. No bruits or masses. Good bowel sounds. Extremities: no cyanosis, clubbing, rash, 2+ edema Neuro: alert.  Follows all commands.  Muscles remain very weak.  Telemetry: Sinus 80s to 90s.  Personally reviewed   Labs: Basic Metabolic Panel: Recent Labs  Lab 10/09/19 0411  10/09/19 0258 10/10/19 5277 10/10/19 8242 10/10/19 1808 10/10/19 1808 10/10/19 1817 10/11/19 0442 10/12/19 0431 10/13/19 0351  NA  --    < > 144   < > 144  --  145 142 141 143  K  --    < > 4.4   < > 4.0  --  3.9 4.2 3.9 3.3*  CL  --    < > 102  --  102  --   --  99 99 94*  CO2  --    < > 33*  --  33*  --   --  34* 35* 39*  GLUCOSE  --    < > 94  --  175*  --   --  119* 140* 114*  BUN  --    < > 60*  --  52*  --   --  41* 55* 55*  CREATININE  --    < > 0.61  --  0.66  --   --  0.62 0.68 0.64  CALCIUM  --    < > 8.6*   < > 8.6*   < >  --  8.6* 8.3* 8.4*  MG 2.0  --  1.8  --   --   --   --  2.1 2.0 1.7  PHOS 3.9  --  4.0  --   --   --   --  4.0 4.1 3.2   < > = values  in this interval not displayed.    Liver Function Tests: Recent Labs  Lab 10/07/19 0317 10/10/19 0328  ALBUMIN 1.9* 1.8*   No results for input(s): LIPASE, AMYLASE in the last 168 hours. No results for input(s): AMMONIA in the last 168 hours.  CBC: Recent Labs  Lab 10/10/19 0328 10/10/19 0548 10/10/19 1808 10/10/19 1817 10/11/19 0442 10/12/19 0431 10/13/19 0351  WBC 6.2  --  9.4  --  8.3 6.0 8.6  HGB 7.7*   < > 9.1* 9.9* 8.9* 7.8* 8.4*  HCT 26.6*   < > 30.6* 29.0* 29.8* 27.1* 29.0*  MCV 97.8  --  96.2  --  94.3 97.8 95.7  PLT 98*  --  120*  --  135* 124* 184   < > = values in this interval not displayed.    Cardiac Enzymes: No results for input(s): CKTOTAL, CKMB, CKMBINDEX, TROPONINI in the last 168 hours.  BNP: BNP (last 3 results) Recent Labs    07/22/19 1039  BNP 15.3    ProBNP (last 3 results) No results for input(s): PROBNP in the last 8760 hours.    Other results:  Imaging: VAS [place] LOWER EXTREMITY VENOUS (DVT)  Result Date: 10/11/2019  Lower Venous DVTStudy Indications: Swelling, and s/p COVID infection with respiratory complications.  Comparison Study: No prior study Performing Technologist: Maudry Mayhew MHA, RDMS, RVT, RDCS  Examination Guidelines: A complete evaluation  includes B-mode imaging, spectral Doppler, color Doppler, and power Doppler as needed of all accessible portions of each vessel. Bilateral testing is considered an integral part of a complete examination. Limited examinations for reoccurring indications may be performed as noted. The reflux portion of the exam is performed with the patient in reverse Trendelenburg.  +---------+---------------+---------+-----------+----------+--------------+ RIGHT    CompressibilityPhasicitySpontaneityPropertiesThrombus Aging +---------+---------------+---------+-----------+----------+--------------+ CFV      Full           Yes      Yes                                 +---------+---------------+---------+-----------+----------+--------------+ SFJ      Full                                                        +---------+---------------+---------+-----------+----------+--------------+ FV Prox  Full                                                        +---------+---------------+---------+-----------+----------+--------------+ FV Mid   Full                                                        +---------+---------------+---------+-----------+----------+--------------+ FV DistalFull                                                        +---------+---------------+---------+-----------+----------+--------------+  POP      Full           Yes      Yes                                 +---------+---------------+---------+-----------+----------+--------------+ PTV      Full                                                        +---------+---------------+---------+-----------+----------+--------------+ PERO     Full                                                        +---------+---------------+---------+-----------+----------+--------------+   +---------+---------------+---------+-----------+----------+--------------+ LEFT      CompressibilityPhasicitySpontaneityPropertiesThrombus Aging +---------+---------------+---------+-----------+----------+--------------+ CFV      Full           Yes      Yes                                 +---------+---------------+---------+-----------+----------+--------------+ SFJ      Full                                                        +---------+---------------+---------+-----------+----------+--------------+ FV Prox  Full                                                        +---------+---------------+---------+-----------+----------+--------------+ FV Mid   Full                                                        +---------+---------------+---------+-----------+----------+--------------+ FV DistalFull                                                        +---------+---------------+---------+-----------+----------+--------------+ POP      Full           Yes      Yes                                 +---------+---------------+---------+-----------+----------+--------------+ PTV      Full                                                        +---------+---------------+---------+-----------+----------+--------------+  PERO     Full                                                        +---------+---------------+---------+-----------+----------+--------------+     Summary: RIGHT: - There is no evidence of deep vein thrombosis in the lower extremity. However, portions of this examination were limited- see technologist comments above.  - No cystic structure found in the popliteal fossa.  LEFT: - There is no evidence of deep vein thrombosis in the lower extremity. However, portions of this examination were limited- see technologist comments above.  - No cystic structure found in the popliteal fossa.  *See table(s) above for measurements and observations. Electronically signed by Monica Martinez MD on 10/11/2019 at 4:40:27 PM.    Final    VAS  [place] UPPER EXTREMITY VENOUS DUPLEX  Result Date: 10/11/2019 UPPER VENOUS STUDY  Indications: Swelling Comparison Study: No prior study Performing Technologist: Maudry Mayhew MHA, RDMS, RVT, RDCS  Examination Guidelines: A complete evaluation includes B-mode imaging, spectral Doppler, color Doppler, and power Doppler as needed of all accessible portions of each vessel. Bilateral testing is considered an integral part of a complete examination. Limited examinations for reoccurring indications may be performed as noted.  Right Findings: +----------+------------+---------+-----------+----------+-------+ RIGHT     CompressiblePhasicitySpontaneousPropertiesSummary +----------+------------+---------+-----------+----------+-------+ Subclavian    Full       Yes       Yes                      +----------+------------+---------+-----------+----------+-------+ Axillary      Full       Yes       Yes                      +----------+------------+---------+-----------+----------+-------+ Brachial      Full       Yes       Yes                      +----------+------------+---------+-----------+----------+-------+ Radial        Full                                          +----------+------------+---------+-----------+----------+-------+ Ulnar         Full                                          +----------+------------+---------+-----------+----------+-------+ Cephalic      Full                                          +----------+------------+---------+-----------+----------+-------+  Left Findings: +----------+------------+---------+-----------+----------+-------+ LEFT      CompressiblePhasicitySpontaneousPropertiesSummary +----------+------------+---------+-----------+----------+-------+ Subclavian    Full       Yes       Yes                      +----------+------------+---------+-----------+----------+-------+ Axillary      Full       Yes  Yes                       +----------+------------+---------+-----------+----------+-------+ Brachial      Full       Yes       Yes                      +----------+------------+---------+-----------+----------+-------+ Radial        Full                                          +----------+------------+---------+-----------+----------+-------+ Ulnar         Full                                          +----------+------------+---------+-----------+----------+-------+ Cephalic      Full                                          +----------+------------+---------+-----------+----------+-------+  Summary:  Right: No evidence of deep vein thrombosis in the upper extremity. No evidence of superficial vein thrombosis in the upper extremity. This study was limited.  Left: No evidence of deep vein thrombosis in the upper extremity. No evidence of superficial vein thrombosis in the upper extremity. This study was limited.  *See table(s) above for measurements and observations.  Diagnosing physician: Monica Martinez MD Electronically signed by Monica Martinez MD on 10/11/2019 at 4:40:03 PM.    Final      Medications:     Scheduled Medications: . sodium chloride   Intravenous Once  . sodium chloride   Intravenous Once  . acetaminophen (TYLENOL) oral liquid 160 mg/5 mL  650 mg Per Tube Q6H  . amiodarone  200 mg Per Tube Daily  . amLODipine  5 mg Per Tube Daily  . vitamin C  500 mg Per Tube Daily  . chlorhexidine gluconate (MEDLINE KIT)  15 mL Mouth Rinse BID  . Chlorhexidine Gluconate Cloth  6 each Topical Daily  . cloNIDine  0.2 mg Per Tube Q6H  . diatrizoate meglumine-sodium  30 mL Oral Once  . enoxaparin (LOVENOX) injection  60 mg Subcutaneous Q24H  . feeding supplement (OSMOLITE 1.5 CAL)  237 mL Per Tube 6 X Daily  . feeding supplement (PRO-STAT SUGAR FREE 64)  60 mL Per Tube QID  . free water  300 mL Per Tube Q6H  . Gerhardt's butt cream   Topical QID  . hydrALAZINE  100 mg Per  Tube Q8H  . insulin aspart  0-20 Units Subcutaneous Q4H  . insulin aspart  5 Units Subcutaneous Q4H  . insulin detemir  12 Units Subcutaneous BID  . ipratropium-albuterol  3 mL Nebulization TID  . magic mouthwash  5 mL Oral TID  . mouth rinse  15 mL Mouth Rinse 10 times per day  . montelukast  10 mg Per Tube QHS  . nutrition supplement (JUVEN)  1 packet Per Tube BID BM  . pantoprazole sodium  40 mg Per Tube BID  . potassium chloride  20 mEq Per Tube BID  . propranolol  80 mg Per Tube TID  . psyllium  1 packet Per Tube BID  . QUEtiapine  50 mg Per Tube QHS  . sodium chloride flush  10-40 mL Intracatheter Q12H  . testosterone  5 g Transdermal Daily  . white petrolatum   Topical BID    Infusions: . sodium chloride    . sodium chloride 250 mL (10/08/19 1626)  . sodium chloride Stopped (10/13/19 0548)  . dexmedetomidine (PRECEDEX) IV infusion 0.3 mcg/kg/hr (10/13/19 0700)  . furosemide (LASIX) infusion 8 mg/hr (10/13/19 0700)  . magnesium sulfate bolus IVPB    . potassium chloride 10 mEq (10/13/19 0851)    PRN Medications: Place/Maintain arterial line **AND** sodium chloride, sodium chloride, sodium chloride, acetaminophen (TYLENOL) oral liquid 160 mg/5 mL, acetaminophen, albuterol, [COMPLETED] diazepam **FOLLOWED BY** diazepam, hydrALAZINE, HYDROmorphone, magnesium hydroxide, midazolam, ondansetron (ZOFRAN) IV, senna-docusate, sodium chloride, sodium chloride flush   Assessment/Plan:    1. Acute hypoxic/hypercarbic respiratory failure due to COVID PNA: Remained markedly hypoxic despite full support. Intubated 12/26. S/p bilateral CTs 12/26 for pneumothoraces.  TEE on 12/26 LVEF 70% RV ok. VV ECMO begun 12/26. Tracheostomy 12/29.  COVID ARDS at this point. ECMO circuit changed 1/8 & 1/23. Crescent cannula placed 2/2 (femoral cannuals removed) - Decannulated 3/4 -Doing well on trach collar. - No chest x-ray today.  We will get an a.m. - With post-cannulation fevers recultured and  restarted abx and start steroids for possible post-decannulation SIRS response.  We will stop steroids.  Continue antibiotics now for possible abdominal wall abscess.  Cultures no growth to date. - Completed 14 days  of meropenum on 2/28. Restarted 3/5 -Remains volume overloaded.  Continue Lasix drip.  Set K. - DVT dose heparin  2. COVID PNA: management of COVID infection per CCM. CXR with bilateral multifocal PNA progressing to likely ARDS.  - s/p 10 days of remdesivir - 12/16, 12/2 s/p tociluzimab - 12/17 s/p convalescent plasma - Decadron at 57m/daily completed 1/11  3. ID: Empiric coverage with vancomycin/cefepime initially for possible secondary bacterial PNA, course completed.   - treated with cefepime and vanc for + cx with staph epidimidis, MSSA and serratia (sensitivities ok). - developed recurrent PNA and sepsis 2/15 - BAL with > 100K serratia. BAL growing > 100K serratia. Completed 14 days mero on 2/28 - Repeat bronch 3/3 looked good  - With fevers will reculture, restarted abx and start steroids for possible post-decannulation SIRS response on 3/5.  We will stop steroids.  Continue antibiotics now for possible abdominal wall abscess.  Cultures no growth to date. -Now with probable abscess around G-tube.  I have evaluated with Dr. AOrvan Seenand we were able to express some purulent material around the site.  We will get abdominal CT today.    4. Anemia: - hgb 8.4. Follow  5. FEN - now with G-tube.as above concern for abdominal wall abscess along G-tube tract.  Hold feeds for now.  Image as above.  5. Hypernatremia - Resolving.  Continue to cut back free water as tolerated  6. AKI - Resolved. Volume status up. -> lasix gtt  7. DTIs - WOC following. Has evolving wound on left 5th toe. Continue to follow - No change  8.  Hypokalemia. -We will supplement.   Length of Stay: 872    DGlori BickersMD 10/13/2019, 9:09 AM  Advanced Heart Failure Team Pager [phone] (M-F; 7a - 4p)  Please contact [facility] for night-coverage after hours (4p -7a ) and weekends on [email or web address]

## 2019-10-13 NOTE — Progress Notes (Signed)
Pt transported to CT via ventilator with no complications noted.

## 2019-10-14 ENCOUNTER — Inpatient Hospital Stay (HOSPITAL_COMMUNITY): Payer: Managed Care, Other (non HMO)

## 2019-10-14 DIAGNOSIS — Z930 Tracheostomy status: Secondary | ICD-10-CM

## 2019-10-14 HISTORY — PX: IR REPLC GASTRO/COLONIC TUBE PERCUT W/FLUORO: IMG2333

## 2019-10-14 LAB — BASIC METABOLIC PANEL
Anion gap: 11 (ref 5–15)
Anion gap: 9 (ref 5–15)
BUN: 33 mg/dL — ABNORMAL HIGH (ref 6–20)
BUN: 36 mg/dL — ABNORMAL HIGH (ref 6–20)
CO2: 44 mmol/L — ABNORMAL HIGH (ref 22–32)
CO2: 41 mmol/L — ABNORMAL HIGH (ref 22–32)
Calcium: 8.6 mg/dL — ABNORMAL LOW (ref 8.9–10.3)
Calcium: 8.3 mg/dL — ABNORMAL LOW (ref 8.9–10.3)
Chloride: 87 mmol/L — ABNORMAL LOW (ref 98–111)
Chloride: 89 mmol/L — ABNORMAL LOW (ref 98–111)
Creatinine, Ser: 0.59 mg/dL — ABNORMAL LOW (ref 0.61–1.24)
Creatinine, Ser: 0.63 mg/dL (ref 0.61–1.24)
GFR calc Af Amer: >60 mL/min (ref >60)
GFR calc Af Amer: >60 mL/min (ref >60)
GFR calc non Af Amer: >60 mL/min (ref >60)
GFR calc non Af Amer: >60 mL/min (ref >60)
Glucose, Bld: 95 mg/dL (ref 70–99)
Glucose, Bld: 158 mg/dL — ABNORMAL HIGH (ref 70–99)
Potassium: 3.1 mmol/L — ABNORMAL LOW (ref 3.5–5.1)
Potassium: 3.5 mmol/L (ref 3.5–5.1)
Sodium: 142 mmol/L (ref 135–145)
Sodium: 139 mmol/L (ref 135–145)

## 2019-10-14 LAB — CBC
HCT: 32.6 % — ABNORMAL LOW (ref 39.0–52.0)
Hemoglobin: 9.5 g/dL — ABNORMAL LOW (ref 13.0–17.0)
MCH: 27.9 pg (ref 26.0–34.0)
MCHC: 29.1 g/dL — ABNORMAL LOW (ref 30.0–36.0)
MCV: 95.6 fL (ref 80.0–100.0)
Platelets: 233 10*3/uL (ref 150–400)
RBC: 3.41 MIL/uL — ABNORMAL LOW (ref 4.22–5.81)
RDW: 16.8 % — ABNORMAL HIGH (ref 11.5–15.5)
WBC: 9 10*3/uL (ref 4.0–10.5)
nRBC: 0 % (ref 0.0–0.2)

## 2019-10-14 LAB — VANCOMYCIN, PEAK: Vancomycin Pk: 71 ug/mL (ref 30–40)

## 2019-10-14 LAB — COOXEMETRY PANEL
Carboxyhemoglobin: 3.8 % — ABNORMAL HIGH (ref 0.5–1.5)
Methemoglobin: 0.8 % (ref 0.0–1.5)
O2 Saturation: 78.2 %
Total hemoglobin: 12.7 g/dL (ref 12.0–16.0)

## 2019-10-14 LAB — GLUCOSE, CAPILLARY
Glucose-Capillary: 87 mg/dL (ref 70–99)
Glucose-Capillary: 103 mg/dL — ABNORMAL HIGH (ref 70–99)
Glucose-Capillary: 91 mg/dL (ref 70–99)
Glucose-Capillary: 147 mg/dL — ABNORMAL HIGH (ref 70–99)
Glucose-Capillary: 113 mg/dL — ABNORMAL HIGH (ref 70–99)

## 2019-10-14 LAB — PHOSPHORUS: Phosphorus: 3.7 mg/dL (ref 2.5–4.6)

## 2019-10-14 LAB — MAGNESIUM: Magnesium: 1.9 mg/dL (ref 1.7–2.4)

## 2019-10-14 LAB — VANCOMYCIN, TROUGH: Vancomycin Tr: 39 ug/mL (ref 15–20)

## 2019-10-14 MED ORDER — POTASSIUM CHLORIDE 20 MEQ/15ML (10%) PO SOLN
40.0000 meq | Freq: Once | ORAL | Status: AC
  Administered 2019-10-14: 40 meq via ORAL
  Filled 2019-10-14: qty 30

## 2019-10-14 MED ORDER — POTASSIUM CHLORIDE 10 MEQ/50ML IV SOLN
10.0000 meq | INTRAVENOUS | Status: AC
  Administered 2019-10-14 (×4): 10 meq via INTRAVENOUS
  Filled 2019-10-14 (×4): qty 50

## 2019-10-14 MED ORDER — LIDOCAINE VISCOUS HCL 2 % MT SOLN
OROMUCOSAL | Status: AC | PRN
  Administered 2019-10-14: 15 mL via OROMUCOSAL

## 2019-10-14 MED ORDER — POTASSIUM CHLORIDE 20 MEQ/15ML (10%) PO SOLN
20.0000 meq | Freq: Once | ORAL | Status: AC
  Administered 2019-10-14: 20 meq
  Filled 2019-10-14: qty 15

## 2019-10-14 MED ORDER — IOHEXOL 300 MG/ML  SOLN
50.0000 mL | Freq: Once | INTRAMUSCULAR | Status: AC | PRN
  Administered 2019-10-14: 20 mL

## 2019-10-14 MED ORDER — FENTANYL CITRATE (PF) 100 MCG/2ML IJ SOLN
INTRAMUSCULAR | Status: AC
  Filled 2019-10-14: qty 2

## 2019-10-14 MED ORDER — MAGNESIUM SULFATE 2 GM/50ML IV SOLN
2.0000 g | Freq: Once | INTRAVENOUS | Status: AC
  Administered 2019-10-14: 2 g via INTRAVENOUS
  Filled 2019-10-14: qty 50

## 2019-10-14 MED ORDER — LIDOCAINE VISCOUS HCL 2 % MT SOLN
OROMUCOSAL | Status: AC
  Filled 2019-10-14: qty 15

## 2019-10-14 MED ORDER — PANTOPRAZOLE SODIUM 40 MG IV SOLR
40.0000 mg | Freq: Two times a day (BID) | INTRAVENOUS | Status: DC
  Administered 2019-10-14 – 2019-10-23 (×19): 40 mg via INTRAVENOUS
  Filled 2019-10-14 (×19): qty 40

## 2019-10-14 MED ORDER — FENTANYL CITRATE (PF) 100 MCG/2ML IJ SOLN
INTRAMUSCULAR | Status: AC | PRN
  Administered 2019-10-14: 50 ug via INTRAVENOUS

## 2019-10-14 NOTE — Progress Notes (Signed)
PT Cancellation Note  Patient Details Name: [name] MRN: [id number] DOB: 21-Mar-1976   Cancelled Treatment:    Reason Eval/Treat Not Completed: Patient at procedure or test/unavailable. PEG tube dislodged over weekend. On arrival, pt leaving floor for PEG replacement in IR. PT to re-attempt as time allows.   [name] 10/14/2019, 11:56 AM  [name], PT  Office # [phone] Pager [phone]

## 2019-10-14 NOTE — Progress Notes (Signed)
NAME:  [name], MRN:  [id number], DOB:  07/05/1976, LOS: 36 ADMISSION DATE:  07/22/2019, CONSULTATION DATE:  12/24 REFERRING MD:  Sloan Leiter, CHIEF COMPLAINT:  Dyspnea   Brief History   44 y/o male admitted 12/14 with COVID pneumonia leading to ARDS, pneumomediastinum.  Intubated 12/26, bilateral chest tubes placed.  Past Medical History  GERD HTN Asthma  Significant Hospital Events   12/14 admit with hypoxemic respiratory failure in setting of COVID-19 pneumonia 12/23 noted to have pneumomediastinum on chest x-ray 12/24PCCM consulted for evaluation 12/26 worsening hypoxemia, intubated 12/27 VV fem/fem ECMO cannulation 12/29 tracheostomy >> See bedside nursing event notes for full rundown of procedures after 12/27 1/5 epistaxis, nasal packing 1/30 improved cxr, improved lung compliance, TV 350s on 15PC  1/31 CXR increased infiltrates, +CFB     2/2 Converted to right IJ Crescent cannula. 2/3 PEG tube inserted at bedside 2/14 30 min sweep trial> paO2 105 (18 PEEP, 60% FiO2) pH 7.22, pCO2 87 2/20 2 hours sweep trial 2/21 1.5 hour sweep trial 2/24 7-hour sweep trial.  Increasingly awake. 3/2 - decannulation attempt aborted due to increased hypercapnia when sedated for procedure  Consults:  PCCM, CTS, CHF, Palliative  Procedures:  PICC 1/3 >> R radial a-line 1/20 >> R IJ ECMO cath 2/3 >> Tracheostomy 12/29 >>  Significant Diagnostic Tests:  CT chest 12/22>>extensive pneumomediastinum, multifocal patchy bilateral groundglass opacities CXR 1/25 >> improving bilateral infiltrates. ECMO cannulae in place. CXR 2/5 >> clearing bilateral infiltrates  Micro Data:  BCx2 12/14 >>negative  Sputum 12/15 >> OPF 12/28 blood > negative 12/29 > staph aureus 1/4 bronch:  1/2 acid fast smear bronch: negative 1/2 fungal cx bronch: negative1/12 BAL: serratia, staph epidermidisu 1/4 BAL > MSSA, Serratia 1/9 blood > negative 1/9 Resp culture > MSSA, serratia 1/11 serratia >  negative 1/12 fungus culture > negative 1/12 BAL > serratia 1/18 BAL >> rare Serratia. 1/24 blood > negative 1/30 blood > negative 1/30 resp culture > serratia 2/1 BAL >> rare Serratia - enterococcus 2/1 aspergillus Ag > negative 2/2 resp culture > serratia  2/2 blood > negative 2/5 urine > negative 2/9 resp: serratia 2/15 BAL serratia  Antimicrobials:  Received remdesivir, steroids, actemra and convalescent plasma  5 days cefepime 12/24-12/29 Cefepime 1/4->1/13 vanc 1/4> 1/11 Ceftriaxone 1/14 > 1/27 vanc x1 1/20 Cefazolin 1/29 > 2/2 cefuroxim 2/2 vanc 2/2 Ceftriaxone x1 2/4 Meropenem 2/15>>2/21 Tobramycin inhaled 2/15>>2/21  Interim history/subjective:   No fevers overnight.  Tolerated CPAP trials for several hours yesterday.  Objective   Blood pressure (!) 145/80, pulse 92, temperature 99 F (37.2 C), temperature source Oral, resp. rate (!) 21, height _0  (1.727 m), weight 126.4 kg, SpO2 98 %. CVP:  [3 mmHg] 3 mmHg  Vent Mode: PSV;CPAP FiO2 (%):  [40 %] 40 % Set Rate:  [20 bmp] 20 bmp PEEP:  [8 cmH20] 8 cmH20 Pressure Support:  [10 cmH20] 10 cmH20 Plateau Pressure:  [26 cmH20] 26 cmH20   Intake/Output Summary (Last 24 hours) at 10/14/2019 0810 Last data filed at 10/14/2019 0700 Gross per 24 hour  Intake 1736.87 ml  Output 5500 ml  Net -3763.13 ml   Filed Weights   10/12/19 0500 10/13/19 0434 10/14/19 0400  Weight: 134.2 kg 131.7 kg 126.4 kg    Examination: General appearance: 44 y.o., male, tracheostomy tube dependent on mechanical life support Eyes: Tracking HENT: Trach in place Neck: Bandage over right cannulation site Lungs: Bilateral mechanically ventilated breath sounds, good tidal volumes on pressure support  10/8 CV: Regular rate rhythm, S1-S2 Abdomen: Soft obese pannus, PEG tube removed, palpable indurated area Extremities: No significant edema Skin: Deep tissue injury on the lateral feet, some skin sloughing starting to occur Neuro: Alert  oriented following commands, neuromuscularly very weak    ABG    Component Value Date/Time   PHART 7.384 10/10/2019 1817   PCO2ART 63.7 (H) 10/10/2019 1817   PO2ART 76.0 (L) 10/10/2019 1817   HCO3 38.0 (H) 10/10/2019 1817   TCO2 40 (H) 10/10/2019 1817   ACIDBASEDEF 4.0 (H) 09/16/2019 2334   O2SAT 78.2 10/14/2019 0349     Resolved Hospital Problem list   Pneumothoraces-resolved.  Assessment & Plan:   ARDS due to COVID 19, requiring mechanical ventilation.  Decannulated from VV ECMO on 3/4 after 65 days. -Continue weaning patient from mechanical support slowly -Patient tolerated pressure support trial for several hours yesterday. -Continue pressure support trial 10/8. -Continue routine trach care -Remains off of continuous sedation.  Fever-unclear if this is cytokine response due to decannulation versus unidentified source of infection.  Stable SIRS from decannulation Dislodged PEG tube Indurated area of the skin right lateral PEG tube. -Plans for IR intervention and replacement. -Remains on antibiotics because of this  AKI with azotemia - improving  Positive cumulative fluid balance Hypernatremia has corrected Severe third spacing -Continue diuresis to maintain euvolemia, close observation of contraction alkalosis  Hypertension -Continue amlodipine clonidine propanolol hydralazine  Serratia pneumonia> treated.  Repeat bilateral BAL on 3/3 no growth.  Neuromuscular weakness -Continue aggressive PT and OT -Passive range of motion daily. -Encourage active range of motion.  Bilateral lower feet deep tissue injury -Continue wound care, sloughing is started to occur  Anemia due to critical illness, increased RBC turnover -Conservative transfusion hemoglobin threshold.  Best practice:  Pain/Anxiety/Delirium protocol (if indicated): PRNs only VAP protocol (if indicated): Bundle in place. Respiratory support goals: Pressure support CPAP trials. DVT prophylaxis:  Heparin drip, goal 0.3-0.5 GI prophylaxis: PPI Nutrition: TF Fluid status goals:  See above Urinary catheter: Guide hemodynamic management Glucose control: controlled with current regimen Mobility/therapy needs: Bedrest.  Daily labs: as per ECMO protocol. Code Status: Full code  Disposition: ICU Family: Wife updated via ECMO team daily.  Labs   CBC: CBC Latest Ref Rng & Units 10/14/2019 10/13/2019 10/12/2019  WBC 4.0 - 10.5 K/uL 9.0 8.6 6.0  Hemoglobin 13.0 - 17.0 g/dL 9.5(L) 8.4(L) 7.8(L)  Hematocrit 39.0 - 52.0 % 32.6(L) 29.0(L) 27.1(L)  Platelets 150 - 400 K/uL 233 184 124(L)     Basic Metabolic Panel: BMP Latest Ref Rng & Units 10/14/2019 10/13/2019 10/12/2019  Glucose 70 - 99 mg/dL 95 114(H) 140(H)  BUN 6 - 20 mg/dL 33(H) 55(H) 55(H)  Creatinine 0.61 - 1.24 mg/dL 0.59(L) 0.64 0.68  Sodium 135 - 145 mmol/L 142 143 141  Potassium 3.5 - 5.1 mmol/L 3.1(L) 3.3(L) 3.9  Chloride 98 - 111 mmol/L 87(L) 94(L) 99  CO2 22 - 32 mmol/L 44(H) 39(H) 35(H)  Calcium 8.9 - 10.3 mg/dL 8.6(L) 8.4(L) 8.3(L)     GFR: Estimated Creatinine Clearance: 151.1 mL/min (A) (by C-G formula based on SCr of 0.59 mg/dL (L)). Recent Labs  Lab 10/09/19 0411 10/09/19 1631 10/10/19 0328 10/10/19 1808 10/11/19 0442 10/11/19 0450 10/12/19 0431 10/13/19 0351 10/14/19 0349  WBC 6.9   < > 6.2   < > 8.3  --  6.0 8.6 9.0  LATICACIDVEN 1.1  --  0.9  --   --  1.2 1.2  --   --    < > =  values in this interval not displayed.    Liver Function Tests: Recent Labs  Lab 10/10/19 0328  ALBUMIN 1.8*   No results for input(s): LIPASE, AMYLASE in the last 168 hours. No results for input(s): AMMONIA in the last 168 hours.  ABG    Component Value Date/Time   PHART 7.384 10/10/2019 1817   PCO2ART 63.7 (H) 10/10/2019 1817   PO2ART 76.0 (L) 10/10/2019 1817   HCO3 38.0 (H) 10/10/2019 1817   TCO2 40 (H) 10/10/2019 1817   ACIDBASEDEF 4.0 (H) 09/16/2019 2334   O2SAT 78.2 10/14/2019 0349     Coagulation Profile: No  results for input(s): INR, PROTIME in the last 168 hours.  Cardiac Enzymes: No results for input(s): CKTOTAL, CKMB, CKMBINDEX, TROPONINI in the last 168 hours.  HbA1C: Hgb A1c MFr Bld  Date/Time Value Ref Range Status  09/23/2019 08:58 AM 5.5 4.8 - 5.6 % Final    Comment:    (NOTE) Pre diabetes:          5.7%-6.4% Diabetes:              >6.4% Glycemic control for   <7.0% adults with diabetes   07/24/2019 04:50 AM 6.7 (H) 4.8 - 5.6 % Final    Comment:    (NOTE) Pre diabetes:          5.7%-6.4% Diabetes:              >6.4% Glycemic control for   <7.0% adults with diabetes     CBG: Recent Labs  Lab 10/13/19 1525 10/13/19 2039 10/13/19 2345 10/14/19 0334 10/14/19 0749  GLUCAP 110* 100* 145* 87 103*     This patient is critically ill with multiple organ system failure; which, requires frequent high complexity decision making, assessment, support, evaluation, and titration of therapies. This was completed through the application of advanced monitoring technologies and extensive interpretation of multiple databases. During this encounter critical care time was devoted to patient care services described in this note for 31 minutes.  Garner Nash, DO [facility] 10/14/2019 8:11 AM

## 2019-10-14 NOTE — Progress Notes (Signed)
OT Cancellation Note  Patient Details Name: [name] MRN: [id number] DOB: 1975-09-07   Cancelled Treatment:    Reason Eval/Treat Not Completed: Patient at procedure or test/ unavailable(IR for Peg repositioning)  Theresa, OT/L   Acute OT Clinical Specialist Acute Rehabilitation Services Pager [phone] Office [phone]  10/14/2019, 12:53 PM

## 2019-10-14 NOTE — Progress Notes (Signed)
Patient ID: [name], male   DOB: [birth year], 44 y.o.   MRN: [id number]    Advanced Heart Failure Rounding Note   Subjective:    Decannulated 3/4. Had high fevers (to 104) after decannulation. Started on abx and steroids.   Yesterday G-tube came out. Seen by [facility] and unable to place Foley tube in tract. Less painful today.   Remains on vent did PS trials for several hours yesterday.   On lasix gtt. Weight down 20+ pounds over the weekend.   Afebrile on vanc/mero   Objective:   Weight Range:  Vital Signs:   Temp:  [98.8 F (37.1 C)-99.9 F (37.7 C)] 99 F (37.2 C) (03/08 0753) Pulse Rate:  [85-98] 92 (03/08 0700) Resp:  [15-28] 21 (03/08 0700) BP: (111-150)/(65-84) 145/80 (03/08 0700) SpO2:  [95 %-100 %] 98 % (03/08 0742) FiO2 (%):  [40 %] 40 % (03/08 0746) Weight:  [126.4 kg] 126.4 kg (03/08 0400) Last BM Date: 09/15/19  Weight change: Filed Weights   10/12/19 0500 10/13/19 0434 10/14/19 0400  Weight: 134.2 kg 131.7 kg 126.4 kg    Intake/Output:   Intake/Output Summary (Last 24 hours) at 10/14/2019 0802 Last data filed at 10/14/2019 0700 Gross per 24 hour  Intake 1736.87 ml  Output 5500 ml  Net -3763.13 ml     Physical Exam : General:  Awake on vent through trach HEENT: normal Neck: supple. + Trach RIJ site ok Carotids 2+ bilat; no bruits. No lymphadenopathy or thryomegaly appreciated. Cor: PMI nondisplaced. Regular rate & rhythm. No rubs, gallops or murmurs. Lungs: coarse Abdomen: soft, nontender, nondistended. No hepatosplenomegaly. No bruits or masses. Good bowel sounds. PEG site remains indurated Extremities: no cyanosis, clubbing, rash, 1-2+ edema DTIs on feet.  Neuro: awake on vent. Follows commands. Extremities weak   Telemetry: Sinus 80-90s.  Personally reviewed   Labs: Basic Metabolic Panel: Recent Labs  Lab 10/10/19 0328 10/10/19 0548 10/10/19 1808 10/10/19 1808 10/10/19 1817 10/11/19 0442 10/11/19 0442 10/12/19 0431  10/13/19 0351 10/14/19 0349  NA 144   < > 144   < > 145 142  --  141 143 142  K 4.4   < > 4.0   < > 3.9 4.2  --  3.9 3.3* 3.1*  CL 102   < > 102  --   --  99  --  99 94* 87*  CO2 33*   < > 33*  --   --  34*  --  35* 39* 44*  GLUCOSE 94   < > 175*  --   --  119*  --  140* 114* 95  BUN 60*   < > 52*  --   --  41*  --  55* 55* 33*  CREATININE 0.61   < > 0.66  --   --  0.62  --  0.68 0.64 0.59*  CALCIUM 8.6*   < > 8.6*   < >  --  8.6*   < > 8.3* 8.4* 8.6*  MG 1.8  --   --   --   --  2.1  --  2.0 1.7 1.9  PHOS 4.0  --   --   --   --  4.0  --  4.1 3.2 3.7   < > = values in this interval not displayed.    Liver Function Tests: Recent Labs  Lab 10/10/19 0328  ALBUMIN 1.8*   No results for input(s): LIPASE, AMYLASE in the last 168 hours. No  results for input(s): AMMONIA in the last 168 hours.  CBC: Recent Labs  Lab 10/10/19 1808 10/10/19 1808 10/10/19 1817 10/11/19 0442 10/12/19 0431 10/13/19 0351 10/14/19 0349  WBC 9.4  --   --  8.3 6.0 8.6 9.0  HGB 9.1*   < > 9.9* 8.9* 7.8* 8.4* 9.5*  HCT 30.6*   < > 29.0* 29.8* 27.1* 29.0* 32.6*  MCV 96.2  --   --  94.3 97.8 95.7 95.6  PLT 120*  --   --  135* 124* 184 233   < > = values in this interval not displayed.    Cardiac Enzymes: No results for input(s): CKTOTAL, CKMB, CKMBINDEX, TROPONINI in the last 168 hours.  BNP: BNP (last 3 results) Recent Labs    07/22/19 1039  BNP 15.3    ProBNP (last 3 results) No results for input(s): PROBNP in the last 8760 hours.    Other results:  Imaging: CT ABDOMEN PELVIS W CONTRAST  Result Date: 10/13/2019 CLINICAL DATA:  Concern for infection surrounding gastrostomy tube. History of COVID-19 infection. EXAM: CT ABDOMEN AND PELVIS WITH CONTRAST TECHNIQUE: Multidetector CT imaging of the abdomen and pelvis was performed using the standard protocol following bolus administration of intravenous contrast. CONTRAST:  2m OMNIPAQUE IOHEXOL 300 MG/ML  SOLN COMPARISON:  CT abdomen  pelvis-09/05/2019; chest radiograph-10/11/2019 FINDINGS: Lower chest: Limited visualization of the lower thorax extensive bilateral interstitial and airspace opacities with scattered air bronchograms and subpleural consolidative opacities, slightly improved compared to the 09/05/2019 abdominal CT. Interval apparent removal of right-sided chest tube. No pleural effusion. Cardiomegaly.  No significant pericardial effusion. Hepatobiliary: Normal hepatic contour. Normal appearance of the gallbladder given degree distention. No radiopaque gallstones. No intra or extrahepatic biliary ductal dilatation. No ascites. Pancreas: Normal appearance of the pancreas. Spleen: Normal appearance of the spleen. Note is made of a small splenule. Adrenals/Urinary Tract: There is symmetric enhancement of the bilateral kidneys. No definite renal stones on this postcontrast examination. Punctate (approximately 0.9 cm) hypoattenuating lesion within the superior pole the left kidney (image 35, series 3; coronal image 73, series 6), is too small to accurately characterize though favored to represent a renal cyst. No discrete right-sided renal lesions. No urinary obstruction or perinephric stranding. Normal appearance the bilateral adrenal glands. Normal appearance of the urinary bladder given degree of distention. Stomach/Bowel: Disc retention gastrostomy tube is malpositioned and currently imbedded within the subcutaneous tissues of the left anterior upper abdominal wall (image 35, series 3). Scattered minimal amount of adjacent subcutaneous emphysema. No discrete definable/drainable fluid collection either along the subcutaneous track, abdominal wall or within the adjacent abdominal cavity. Enteric contrast, presumably administered via the gastrostomy tube, is seen extending to the level of the rectum without evidence of enteric contrast extravasation, suggested patency of the gastrostomy track. No evidence of enteric obstruction. No  discrete areas of bowel wall thickening. Normal appearance of the terminal ileum and appendix. No pneumoperitoneum, pneumatosis or portal venous gas. Vascular/Lymphatic: Normal caliber of the abdominal aorta. The major branch vessels of the abdominal aorta appear patent this non CTA examination. There is a minimal amount of atherosclerotic plaque involving the bilateral common iliac arteries, not resulting in hemodynamically significant stenosis. Scattered retroperitoneal lymph nodes are numerous though individually not enlarged by size criteria. No bulky retroperitoneal, mesenteric, pelvic or inguinal lymphadenopathy. Reproductive: Dystrophic calcifications within normal sized prostate gland. No free fluid within the pelvic cul-de-sac. Other: Scattered body wall subcutaneous edema, most conspicuous about the bilateral flanks, thighs and midline of the back.  Punctate (approximately 1 cm) subcutaneous nodule within the right abdominal pannus may represent a sebaceous cyst (image 60, series 3). Musculoskeletal: No acute or aggressive osseous abnormalities. Stigmata of dish within the thoracic spine. Mild (about 25%) compression deformities involving T8 through T12 vertebral bodies are unchanged and without associated fracture line. Mild-to-moderate multilevel lumbar spine DDD, worse at L5-S1 with disc space height loss, endplate irregularity and sclerosis. IMPRESSION: 1. Malpositioned disc retention gastrostomy tube, currently imbedded within the subcutaneous tissues of the ventral abdominal wall. Note, presumably enteric contrast was administered for this examination via the gastrostomy tube which suggests preserved patency of the gastrostomy track. 2. Scattered adjacent subcutaneous emphysema without evidence of definable/drainable fluid collection either within the abdominal wall or abdominal cavity. 3. No evidence of enteric obstruction. 4. Minimally improved aeration of lung bases with persistent extensive  bilateral interstitial and airspace opacities, nonspecific though compatible with recent COVID-19 infection. 5. Cardiomegaly. Electronically Signed   By: Sandi Mariscal M.D.   On: 10/13/2019 13:14     Medications:     Scheduled Medications: . acetaminophen (TYLENOL) oral liquid 160 mg/5 mL  650 mg Per Tube Q6H  . amLODipine  5 mg Per Tube Daily  . vitamin C  500 mg Per Tube Daily  . chlorhexidine gluconate (MEDLINE KIT)  15 mL Mouth Rinse BID  . Chlorhexidine Gluconate Cloth  6 each Topical Daily  . cloNIDine  0.2 mg Per Tube BID  . diatrizoate meglumine-sodium  30 mL Oral Once  . enoxaparin (LOVENOX) injection  60 mg Subcutaneous Q24H  . feeding supplement (OSMOLITE 1.5 CAL)  237 mL Per Tube 6 X Daily  . feeding supplement (PRO-STAT SUGAR FREE 64)  60 mL Per Tube QID  . free water  300 mL Per Tube Q6H  . Gerhardt's butt cream   Topical QID  . hydrALAZINE  100 mg Per Tube Q8H  . insulin aspart  0-20 Units Subcutaneous Q4H  . insulin aspart  5 Units Subcutaneous Q4H  . insulin detemir  12 Units Subcutaneous BID  . ipratropium-albuterol  3 mL Nebulization TID  . magic mouthwash  5 mL Oral TID  . mouth rinse  15 mL Mouth Rinse 10 times per day  . montelukast  10 mg Per Tube QHS  . nutrition supplement (JUVEN)  1 packet Per Tube BID BM  . pantoprazole sodium  40 mg Per Tube BID  . potassium chloride  20 mEq Per Tube BID  . propranolol  40 mg Per Tube TID  . psyllium  1 packet Per Tube BID  . QUEtiapine  50 mg Per Tube QHS  . sodium chloride flush  10-40 mL Intracatheter Q12H  . testosterone  5 g Transdermal Daily    Infusions: . sodium chloride    . sodium chloride 250 mL (10/08/19 1626)  . sodium chloride Stopped (10/13/19 0548)  . dexmedetomidine (PRECEDEX) IV infusion 0.3 mcg/kg/hr (10/14/19 0700)  . furosemide (LASIX) infusion 8 mg/hr (10/14/19 0700)  . magnesium sulfate bolus IVPB 2 g (10/14/19 0747)  . meropenem (MERREM) IV Stopped (10/14/19 0032)  . potassium chloride 10  mEq (10/14/19 0648)  . vancomycin Stopped (10/14/19 0531)    PRN Medications: Place/Maintain arterial line **AND** sodium chloride, sodium chloride, sodium chloride, acetaminophen (TYLENOL) oral liquid 160 mg/5 mL, acetaminophen, albuterol, [COMPLETED] diazepam **FOLLOWED BY** diazepam, hydrALAZINE, HYDROmorphone, magnesium hydroxide, midazolam, ondansetron (ZOFRAN) IV, senna-docusate, sodium chloride, sodium chloride flush   Assessment/Plan:    1. Acute hypoxic/hypercarbic respiratory failure due to COVID PNA:  Remained markedly hypoxic despite full support. Intubated 12/26. S/p bilateral CTs 12/26 for pneumothoraces.  TEE on 12/26 LVEF 70% RV ok. VV ECMO begun 12/26. Tracheostomy 12/29.  COVID ARDS at this point. ECMO circuit changed 1/8 & 1/23. Crescent cannula placed 2/2 (femoral cannuals removed) - Decannulated 3/4 - Doing well with pressure support trials.  Continue to wean. - No chest x-ray for several days.  We will repeat this a.m. - With post-cannulation fevers recultured and restarted abx and start steroids for possible post-decannulation SIRS response.  Now off steroids.  Continue antibiotics now for possible abdominal wall abscess.  Cultures no growth to date. - Completed 14 days  of meropenum on 2/28. Restarted 3/5 -I am status much improved with Lasix drip over the weekend.  He is developing a bit of a contraction alkalosis.  He is down over 20 pounds.  We will hold Lasix drip for now. -Continue DVT dose heparin  2. COVID PNA: management of COVID infection per CCM. CXR with bilateral multifocal PNA progressing to likely ARDS.  - s/p 10 days of remdesivir - 12/16, 12/2 s/p tociluzimab - 12/17 s/p convalescent plasma - Decadron at 37m/daily completed 1/11  3. ID: Empiric coverage with vancomycin/cefepime initially for possible secondary bacterial PNA, course completed.   - treated with cefepime and vanc for + cx with staph epidimidis, MSSA and serratia (sensitivities ok). -  developed recurrent PNA and sepsis 2/15 - BAL with > 100K serratia. BAL growing > 100K serratia. Completed 14 days mero on 2/28 - Repeat bronch 3/3 looked good  -Continue antibiotics as above for possible abdominal wall abscess at PEG site  4. Anemia: - hgb 9.5. Follow  5. FEN - G-tube dislodged over the weekend.  General surgery has seen and tried to place a Foley tube through the previous tract but were unsuccessful.  We have consulted IR this morning for their assistance.  5. Hypernatremia -Resolved.  6. AKI - Resolved.   7. DTIs - On both feet.  These are resolving.  8.  Hypokalemia. -K 3.1.  Lack of enteral access will stop with IV today.  Discussed with pharmacy personally.  Also stop magnesium.  9.  Diffuse muscle weakness. -This is consistent with ICU/steroid/paralytics myopathy.  Continue passive range of motion exercises with PT.  He is improving slowly.  CRITICAL CARE Performed by: BGlori Bickers Total critical care time: 35 minutes  Critical care time was exclusive of separately billable procedures and treating other patients.  Critical care was necessary to treat or prevent imminent or life-threatening deterioration.  Critical care was time spent personally by me (independent of midlevel providers or residents) on the following activities: development of treatment plan with patient and/or surrogate as well as nursing, discussions with consultants, evaluation of patient's response to treatment, examination of patient, obtaining history from patient or surrogate, ordering and performing treatments and interventions, ordering and review of laboratory studies, ordering and review of radiographic studies, pulse oximetry and re-evaluation of patient's condition.    Length of Stay: 833    DGlori BickersMD 10/14/2019, 8:02 AM  Advanced Heart Failure Team Pager [phone](M-F; 7[place]  Please contact [facility] for night-coverage after hours (4p -7a )  and weekends on [email or web address]

## 2019-10-14 NOTE — Progress Notes (Signed)
Patient ID: [name], male   DOB: Jan 07, 1976, 44 y.o.   MRN: [id number]   Pt with Hx Covid PNA  07/2019 Vent-- ECMO - off now Trach  G tube placed at bedside 09/11/19-- GI MD  Came out yesterday MD tried to place foley Unable to place  Request now for IR to replace G tube  Discusses with wife [name] via phone She is agreeable to procedure Consent signed and in IR  We will call for pt when can

## 2019-10-14 NOTE — Progress Notes (Signed)
Pharmacy Antibiotic Note  [name] is a 44 y.o. male admitted on 07/22/2019 with COVID respiratory distress, s/p ECMO- decannulated 3/4 now with SIRS like reaction with high fevers, WBC wnl, will recheck cx and empiric ABX.  Continued on vancomycin for abdominal wall abscess.  Pharmacy has been consulted for vancomycin and meropenem dosing.  Vancomycin peak level tonight originally reported as 38, then lab corrected to 71 due to issue with reagent.  Now trough level reported as 39.  Levels timed/collected appropriately.  Plan: Hold vancomycin for now - will recheck level in 4 hrs. Meropenem 2gm IV q8h x48hr  Height: _0  (172.7 cm) Weight: 278 lb 10.6 oz (126.4 kg) IBW/kg (Calculated) : 68.4  Temp (24hrs), Avg:99.2 F (37.3 C), Min:98.8 F (37.1 C), Max:100.7 F (38.2 C)  Recent Labs  Lab 10/08/19 0357 10/08/19 1700 10/09/19 0411 10/09/19 0919 10/10/19 0328 10/10/19 0328 10/10/19 1808 10/10/19 1808 10/11/19 0442 10/11/19 0450 10/12/19 0431 10/13/19 0351 10/14/19 0349 10/14/19 1615 10/14/19 2030  WBC 8.5   < > 6.9   < > 6.2   < > 9.4  --  8.3  --  6.0 8.6 9.0  --   --   CREATININE 0.57*  --   --    < > 0.61   < > 0.66   < > 0.62  --  0.68 0.64 0.59* 0.63  --   LATICACIDVEN 0.9  --  1.1  --  0.9  --   --   --   --  1.2 1.2  --   --   --   --   VANCOTROUGH  --   --   --   --   --   --   --   --   --   --   --   --   --   --  14*  VANCOPEAK  --   --   --   --   --   --   --   --   --   --   --   --   --  71*  --    < > = values in this interval not displayed.    Estimated Creatinine Clearance: 151.1 mL/min (by C-G formula based on SCr of 0.63 mg/dL).    No Known Allergies  Antimicrobials this admission: Remdesivir 12/14>>12/23 Actemra x1 12/16; 12/22 Plasma 12/17 Vanco 12/26 >>12/28, 1/4>>1/13, 2/15>>2/17 Cefepime 12/26 >>12/30, 1/4>>1/13 Meropenem 2/15>>2/28 Tobramycin (neb) 2/16>>2/23 Ctx 1/15>> (nose packing)1/28 restarted for 1 dose when changed foley  2/5 Ancef nasal packing 1/28>2/3   Dose adjustments this admission:  Microbiology results: 12/14 BCx: ngF 12/14 sputum: Normal respiratory flora  12/25 MRSA PCR neg  12/28 trach: few MSSA 12/28 BCx: ngF 1/2 bronch: >100 k serratia marcescens (R cefazolin), >100 k staph epidermidis (S vanc, tetra) 1/4 BAL: MSSA, serratia marcescens (R cefazolin) 1/9 sputum: serratia/staph 1/9 bldx2 -ng 1/11 BAL: serratia 1/12 BAL: serratia 1/18 BAL: serratia - S cefepime 1/24 BCx x 2: ngF 1/30 Bcx: ngF 1/30 TA: mod serratia (R cefazolin and CTX) 2/1 BAL: 30k serratia (R- cefazolin), 10k enterococcus 2/2 Sputum: few GNR, rare GPRs 2/2 Bcx x2: ngF 2/2 UCx: ngF 2/5 UCx: ngF 2/9 BAL: mod serratia, rare MSSA 2/15 Bcx x2: ng 2/15 BAL: >100k serratia - only R cefazolin  Bonnita Nasuti Pharm.D. CPP, BCPS Clinical Pharmacist [phone] 10/14/2019 10:16 PM

## 2019-10-14 NOTE — Progress Notes (Signed)
Occupational Therapy Treatment Patient Details Name: [name] MRN: [id number] DOB: 1976-01-07 Today's Date: 10/14/2019    History of present illness 44 y/o M admitted 12/14 with COVID pneumonia causing acute hypoxemic respiratory failure.   Developed pneumomediastinum 12/23 with concern for possible bilateral small pneumothoraces on 12/24. Pt intubated on 12/26 and started on ECMO on 12/27. Tracheostomy on 12/29 with bleeding from trach an dother sites on 12/31. Bronch on 1/11 and 1/15. Slowly weaning sedation 2/5. Continuing to wean sedation and starting sweep trials to wean ECMO - Decannulated 3/4   OT comments  Pt with improved processing speed; more animated and mouthing words. BUE strength continues to improve - RUE weaker than L with L wrist drop and difficulty with digit extension - may benefit from wrist cock-up splint. Pt able to use RUE to help push self into upright sitting with support of bed. Assisting with grooming with support given to hold objects. Will need soft touch call bell to use with L hand. Will continue to follow acutely.  Follow Up Recommendations  LTACH    Equipment Recommendations  Other (comment)(TBA)    Recommendations for Other Services      Precautions / Restrictions Precautions Precautions: Fall;Other (comment) Precaution Comments: vent Required Braces or Orthoses: Other Brace Other Brace: B Prevalon       Mobility Bed Mobility               General bed mobility comments: Pt pushing with RUE to help achieve upright position in bed  Transfers                      Balance     Sitting balance-Leahy Scale: Poor                                     ADL either performed or assessed with clinical judgement   ADL       Grooming: Maximal assistance;Wash/dry face;Oral care Grooming Details (indicate cue type and reason): Assist to maintain grasp on cloth/yonker                                      Vision       Perception     Praxis      Cognition Arousal/Alertness: Awake/alert Behavior During Therapy: Flat affect Overall Cognitive Status: Difficult to assess Area of Impairment: Attention                   Current Attention Level: Selective   Following Commands: Follows one step commands consistently   Awareness: Emergent Problem Solving: Slow processing General Comments: continues to demosntrate progress wtih cognition and processing speed        Exercises General Exercises - Upper Extremity Shoulder Flexion: AAROM;Both;10 reps;Seated Shoulder Extension: AROM;AAROM;Both;Strengthening;10 reps;Seated Shoulder Horizontal ABduction: AROM;AAROM;Both;10 reps;Seated Shoulder Horizontal ADduction: AROM;AAROM;Both;10 reps;Seated Elbow Flexion: AAROM;PROM;Both;10 reps;Seated Elbow Extension: AAROM;AROM;Both;10 reps;Seated Wrist Flexion: AROM;Both;10 reps;Seated Wrist Extension: AROM;PROM;AAROM;Both;10 reps;Seated Digit Composite Flexion: AROM;AAROM;PROM;Both;15 reps;Seated Composite Extension: AROM;PROM;AAROM;Both;10 reps;Seated Other Exercises Other Exercises: PNF diagonals A/AAROM for shoulder dirgle strengthening Other Exercises: scapular strengtheing elevation/depression; protraction/retraction   Shoulder Instructions       General Comments      Pertinent Vitals/ Pain       Pain Assessment: Faces Faces Pain Scale: Hurts little more Pain Location: B knees Pain  Descriptors / Indicators: Grimacing Pain Intervention(s): Limited activity within patient's tolerance  Home Living                                          Prior Functioning/Environment              Frequency  Min 3X/week        Progress Toward Goals  OT Goals(current goals can now be found in the care plan section)  Progress towards OT goals: Progressing toward goals  Acute Rehab OT Goals Patient Stated Goal: per wife for pt to get stronger OT Goal  Formulation: With family Time For Goal Achievement: 10/18/19 Potential to Achieve Goals: Fair ADL Goals Additional ADL Goal #1: Pt will be able to follow one step commands consistently 50% of the time. Additional ADL Goal #2: Pt will sustain attention to activity x 1 min with min cues Additional ADL Goal #3: Pt will be able to move Lt UE to mouth with mod A Additional ADL Goal #4: Pt will maintain midline postural control EOB suing egress position with mod  A +2 in preparation for ADL tasks  Plan Discharge plan remains appropriate    Co-evaluation                 AM-PAC OT "6 Clicks" Daily Activity     Outcome Measure   Help from another person eating meals?: Total Help from another person taking care of personal grooming?: A Lot Help from another person toileting, which includes using toliet, bedpan, or urinal?: Total Help from another person bathing (including washing, rinsing, drying)?: Total Help from another person to put on and taking off regular upper body clothing?: Total Help from another person to put on and taking off regular lower body clothing?: Total 6 Click Score: 7    End of Session Equipment Utilized During Treatment: Other (comment)(vent; peep8)  OT Visit Diagnosis: Muscle weakness (generalized) (M62.81);Cognitive communication deficit (R41.841)   Activity Tolerance Patient tolerated treatment well   Patient Left in bed;with call bell/phone within reach;with nursing/sitter in room   Nurse Communication Mobility status(positioning)        Time: 2130-8657 OT Time Calculation (min): 29 min  Charges: OT General Charges $OT Visit: 1 Visit OT Treatments $Therapeutic Activity: 8-22 mins $Neuromuscular Re-education: 8-22 mins  Maurie Boettcher, OT/L   Acute OT Clinical Specialist [place] Pager [phone] Office [phone]    [facility] 10/14/2019, 5:29 PM

## 2019-10-14 NOTE — Progress Notes (Signed)
Patient placed back on vent from ATC. Patient was on ATC 40% for 3 hours and done well. Patient states he was getting tired.

## 2019-10-14 NOTE — Procedures (Signed)
Interventional Radiology Procedure Note  Procedure:  Rescue of displaced percutaneous gastrostomy.  New 52F balloon retention tube.    Complications: None  Recommendations:  - Ok to use - Routine gtube care   Signed,  Dulcy Fanny. Earleen Newport, DO

## 2019-10-14 NOTE — Progress Notes (Signed)
Patient transported to IR and back without complications.

## 2019-10-15 DIAGNOSIS — Z8616 Personal history of COVID-19: Secondary | ICD-10-CM

## 2019-10-15 LAB — BASIC METABOLIC PANEL
Anion gap: 10 (ref 5–15)
BUN: 39 mg/dL — ABNORMAL HIGH (ref 6–20)
CO2: 41 mmol/L — ABNORMAL HIGH (ref 22–32)
Calcium: 8.5 mg/dL — ABNORMAL LOW (ref 8.9–10.3)
Chloride: 91 mmol/L — ABNORMAL LOW (ref 98–111)
Creatinine, Ser: 0.61 mg/dL (ref 0.61–1.24)
GFR calc Af Amer: >60 mL/min (ref >60)
GFR calc non Af Amer: >60 mL/min (ref >60)
Glucose, Bld: 109 mg/dL — ABNORMAL HIGH (ref 70–99)
Potassium: 3.9 mmol/L (ref 3.5–5.1)
Sodium: 142 mmol/L (ref 135–145)

## 2019-10-15 LAB — CBC
HCT: 29.8 % — ABNORMAL LOW (ref 39.0–52.0)
Hemoglobin: 8.6 g/dL — ABNORMAL LOW (ref 13.0–17.0)
MCH: 27.7 pg (ref 26.0–34.0)
MCHC: 28.9 g/dL — ABNORMAL LOW (ref 30.0–36.0)
MCV: 96.1 fL (ref 80.0–100.0)
Platelets: 208 10*3/uL (ref 150–400)
RBC: 3.1 MIL/uL — ABNORMAL LOW (ref 4.22–5.81)
RDW: 16.4 % — ABNORMAL HIGH (ref 11.5–15.5)
WBC: 6.7 10*3/uL (ref 4.0–10.5)
nRBC: 0.3 % — ABNORMAL HIGH (ref 0.0–0.2)

## 2019-10-15 LAB — COOXEMETRY PANEL
Carboxyhemoglobin: 3.2 % — ABNORMAL HIGH (ref 0.5–1.5)
Methemoglobin: 0.5 % (ref 0.0–1.5)
O2 Saturation: 72.2 %
Total hemoglobin: 7.5 g/dL — ABNORMAL LOW (ref 12.0–16.0)

## 2019-10-15 LAB — PHOSPHORUS: Phosphorus: 3.3 mg/dL (ref 2.5–4.6)

## 2019-10-15 LAB — GLUCOSE, CAPILLARY
Glucose-Capillary: 106 mg/dL — ABNORMAL HIGH (ref 70–99)
Glucose-Capillary: 116 mg/dL — ABNORMAL HIGH (ref 70–99)
Glucose-Capillary: 77 mg/dL (ref 70–99)
Glucose-Capillary: 85 mg/dL (ref 70–99)
Glucose-Capillary: 88 mg/dL (ref 70–99)
Glucose-Capillary: 90 mg/dL (ref 70–99)
Glucose-Capillary: 94 mg/dL (ref 70–99)

## 2019-10-15 LAB — MAGNESIUM: Magnesium: 2 mg/dL (ref 1.7–2.4)

## 2019-10-15 LAB — VANCOMYCIN, RANDOM
Vancomycin Rm: 30
Vancomycin Rm: 22

## 2019-10-15 MED ORDER — HYDRALAZINE HCL 50 MG PO TABS
50.0000 mg | ORAL_TABLET | Freq: Three times a day (TID) | ORAL | Status: DC
  Administered 2019-10-15 – 2019-10-16 (×3): 50 mg
  Filled 2019-10-15 (×3): qty 1

## 2019-10-15 MED ORDER — VANCOMYCIN HCL IN DEXTROSE 1-5 GM/200ML-% IV SOLN
1000.0000 mg | Freq: Three times a day (TID) | INTRAVENOUS | Status: DC
  Administered 2019-10-15: 1000 mg via INTRAVENOUS
  Filled 2019-10-15: qty 200

## 2019-10-15 MED ORDER — PROPRANOLOL HCL 20 MG/5ML PO SOLN
20.0000 mg | Freq: Three times a day (TID) | ORAL | Status: DC
  Administered 2019-10-15 – 2019-10-16 (×5): 20 mg
  Filled 2019-10-15 (×6): qty 5

## 2019-10-15 MED ORDER — VANCOMYCIN VARIABLE DOSE PER UNSTABLE RENAL FUNCTION (PHARMACIST DOSING): Status: DC

## 2019-10-15 NOTE — Progress Notes (Signed)
Patient ID: [name], male   DOB: 1976/07/19, 44 y.o.   MRN: [id number]    Advanced Heart Failure Rounding Note   Subjective:    Decannulated 3/4. Had high fevers (to 104) after decannulation. Started on abx and steroids.   G-tube came out over the weekend. Replaced by IR yesterday  Remains on vent through trach. Doing 2 hour trach collar trials.   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 Working with PT.   Afebrile on vanc/mero  Lasix gtt on hold due to contraction alkalosis   Objective:   Weight Range:  Vital Signs:   Temp:  [98.7 F (37.1 C)-100.7 F (38.2 C)] 98.8 F (37.1 C) (03/09 0740) Pulse Rate:  [78-113] 85 (03/09 0700) Resp:  [16-35] 23 (03/09 0700) BP: (92-153)/(50-135) 121/75 (03/09 0700) SpO2:  [96 %-100 %] 100 % (03/09 0735) FiO2 (%):  [40 %] 40 % (03/09 0736) Weight:  [125.8 kg] 125.8 kg (03/09 0500) Last BM Date: 10/14/19  Weight change: Filed Weights   10/13/19 0434 10/14/19 0400 10/15/19 0500  Weight: 131.7 kg 126.4 kg 125.8 kg    Intake/Output:   Intake/Output Summary (Last 24 hours) at 10/15/2019 0842 Last data filed at 10/15/2019 0700 Gross per 24 hour  Intake 874.93 ml  Output 1401 ml  Net -526.07 ml     Physical Exam : General:  In bed on vent through trach follows command HEENT: normal Neck: supple. + trach no JVD. Carotids 2+ bilat; no bruits. No lymphadenopathy or thryomegaly appreciated. Cor: PMI nondisplaced. Regular rate & rhythm. No rubs, gallops or murmurs. Lungs: clear Abdomen: obese + G tube. Site improved soft, nontender, nondistended. No hepatosplenomegaly. No bruits or masses. Good bowel sounds. Extremities: no cyanosis, clubbing, rash, 1-2+ edema Neuro: alert In bed on vent through trach follows command moves all 4 but weak   Telemetry: Sinus 80-90s.  Personally reviewed   Labs: Basic Metabolic Panel: Recent Labs  Lab 10/11/19 0442 10/11/19 0442 10/12/19 0431 10/12/19 0431 10/13/19 0351 10/13/19 0351  10/14/19 0349 10/14/19 1615 10/15/19 0429  NA 142   < > 141  --  143  --  142 139 142  K 4.2   < > 3.9  --  3.3*  --  3.1* 3.5 3.9  CL 99   < > 99  --  94*  --  87* 89* 91*  CO2 34*   < > 35*  --  39*  --  44* 41* 41*  GLUCOSE 119*   < > 140*  --  114*  --  95 158* 109*  BUN 41*   < > 55*  --  55*  --  33* 36* 39*  CREATININE 0.62   < > 0.68  --  0.64  --  0.59* 0.63 0.61  CALCIUM 8.6*   < > 8.3*   < > 8.4*   < > 8.6* 8.3* 8.5*  MG 2.1  --  2.0  --  1.7  --  1.9  --  2.0  PHOS 4.0  --  4.1  --  3.2  --  3.7  --  3.3   < > = values in this interval not displayed.    Liver Function Tests: Recent Labs  Lab 10/10/19 0328  ALBUMIN 1.8*   No results for input(s): LIPASE, AMYLASE in the last 168 hours. No results for input(s): AMMONIA in the last 168 hours.  CBC: Recent Labs  Lab 10/11/19 0442 10/12/19 0431 10/13/19 0351 10/14/19  4332 10/15/19 0429  WBC 8.3 6.0 8.6 9.0 6.7  HGB 8.9* 7.8* 8.4* 9.5* 8.6*  HCT 29.8* 27.1* 29.0* 32.6* 29.8*  MCV 94.3 97.8 95.7 95.6 96.1  PLT 135* 124* 184 233 208    Cardiac Enzymes: No results for input(s): CKTOTAL, CKMB, CKMBINDEX, TROPONINI in the last 168 hours.  BNP: BNP (last 3 results) Recent Labs    07/22/19 1039  BNP 15.3    ProBNP (last 3 results) No results for input(s): PROBNP in the last 8760 hours.    Other results:  Imaging: CT ABDOMEN PELVIS W CONTRAST  Result Date: 10/13/2019 CLINICAL DATA:  Concern for infection surrounding gastrostomy tube. History of COVID-19 infection. EXAM: CT ABDOMEN AND PELVIS WITH CONTRAST TECHNIQUE: Multidetector CT imaging of the abdomen and pelvis was performed using the standard protocol following bolus administration of intravenous contrast. CONTRAST:  29m OMNIPAQUE IOHEXOL 300 MG/ML  SOLN COMPARISON:  CT abdomen pelvis-09/05/2019; chest radiograph-10/11/2019 FINDINGS: Lower chest: Limited visualization of the lower thorax extensive bilateral interstitial and airspace opacities with  scattered air bronchograms and subpleural consolidative opacities, slightly improved compared to the 09/05/2019 abdominal CT. Interval apparent removal of right-sided chest tube. No pleural effusion. Cardiomegaly.  No significant pericardial effusion. Hepatobiliary: Normal hepatic contour. Normal appearance of the gallbladder given degree distention. No radiopaque gallstones. No intra or extrahepatic biliary ductal dilatation. No ascites. Pancreas: Normal appearance of the pancreas. Spleen: Normal appearance of the spleen. Note is made of a small splenule. Adrenals/Urinary Tract: There is symmetric enhancement of the bilateral kidneys. No definite renal stones on this postcontrast examination. Punctate (approximately 0.9 cm) hypoattenuating lesion within the superior pole the left kidney (image 35, series 3; coronal image 73, series 6), is too small to accurately characterize though favored to represent a renal cyst. No discrete right-sided renal lesions. No urinary obstruction or perinephric stranding. Normal appearance the bilateral adrenal glands. Normal appearance of the urinary bladder given degree of distention. Stomach/Bowel: Disc retention gastrostomy tube is malpositioned and currently imbedded within the subcutaneous tissues of the left anterior upper abdominal wall (image 35, series 3). Scattered minimal amount of adjacent subcutaneous emphysema. No discrete definable/drainable fluid collection either along the subcutaneous track, abdominal wall or within the adjacent abdominal cavity. Enteric contrast, presumably administered via the gastrostomy tube, is seen extending to the level of the rectum without evidence of enteric contrast extravasation, suggested patency of the gastrostomy track. No evidence of enteric obstruction. No discrete areas of bowel wall thickening. Normal appearance of the terminal ileum and appendix. No pneumoperitoneum, pneumatosis or portal venous gas. Vascular/Lymphatic: Normal  caliber of the abdominal aorta. The major branch vessels of the abdominal aorta appear patent this non CTA examination. There is a minimal amount of atherosclerotic plaque involving the bilateral common iliac arteries, not resulting in hemodynamically significant stenosis. Scattered retroperitoneal lymph nodes are numerous though individually not enlarged by size criteria. No bulky retroperitoneal, mesenteric, pelvic or inguinal lymphadenopathy. Reproductive: Dystrophic calcifications within normal sized prostate gland. No free fluid within the pelvic cul-de-sac. Other: Scattered body wall subcutaneous edema, most conspicuous about the bilateral flanks, thighs and midline of the back. Punctate (approximately 1 cm) subcutaneous nodule within the right abdominal pannus may represent a sebaceous cyst (image 60, series 3). Musculoskeletal: No acute or aggressive osseous abnormalities. Stigmata of dish within the thoracic spine. Mild (about 25%) compression deformities involving T8 through T12 vertebral bodies are unchanged and without associated fracture line. Mild-to-moderate multilevel lumbar spine DDD, worse at L5-S1 with disc space height loss,  endplate irregularity and sclerosis. IMPRESSION: 1. Malpositioned disc retention gastrostomy tube, currently imbedded within the subcutaneous tissues of the ventral abdominal wall. Note, presumably enteric contrast was administered for this examination via the gastrostomy tube which suggests preserved patency of the gastrostomy track. 2. Scattered adjacent subcutaneous emphysema without evidence of definable/drainable fluid collection either within the abdominal wall or abdominal cavity. 3. No evidence of enteric obstruction. 4. Minimally improved aeration of lung bases with persistent extensive bilateral interstitial and airspace opacities, nonspecific though compatible with recent COVID-19 infection. 5. Cardiomegaly. Electronically Signed   By: Sandi Mariscal M.D.   On:  10/13/2019 13:14   IR Replc Gastro/Colonic Tube Percut W/Fluoro  Result Date: 10/14/2019 INDICATION: 44 year old male with a history of prior surgically placed gastric tube at the bedside, has become withdrawn EXAM: GASTROSTOMY CATHETER REPLACEMENT MEDICATIONS: None ANESTHESIA/SEDATION: Versed 0 mg IV; Fentanyl 50 mcg IV Moderate Sedation Time:  None The patient was continuously monitored during the procedure by the interventional radiology nurse under my direct supervision. CONTRAST:  32m OMNIPAQUE IOHEXOL 300 MG/ML SOLN - administered into the gastric lumen. FLUOROSCOPY TIME:  Fluoroscopy Time: 4 minutes 30 seconds (175 mGy). COMPLICATIONS: None PROCEDURE: Informed written consent was obtained from the patient and the patient's family after a thorough discussion of the procedural risks, benefits and alternatives. All questions were addressed. Maximal Sterile Barrier Technique was utilized including caps, mask, sterile gowns, sterile gloves, sterile drape, hand hygiene and skin antiseptic. A timeout was performed prior to the initiation of the procedure. The epigastrium was prepped with Betadine in a sterile fashion, and a sterile drape was applied covering the operative field. A sterile gown and sterile gloves were used for the procedure. Initial contrast injection within adapter was unsuccessful in opacifying any of the gastric elements. The ostomy site was probed with a 5 [place] the catheter, Glidewire, without success. Ultimately, adapter was pressurized at the ostomy site with hand injection of contrast, opacifying a thin channel into the stomach. Ultimately the Kumpe the catheter and the Glidewire were navigated through this small channel into the stomach lumen. Once the catheter was within the lumen of the stomach, contrast confirmed location. A stiff Amplatz wire was placed. Serial dilation of the tract a 172[place] performed. Modified Seldinger technique was then used to place a low-profile 14  French balloon retention into it gastric tube. 7 cc of saline was used to inflate the balloon. Patient tolerated the procedure well and remained hemodynamically stable throughout. No complications were encountered and no significant blood loss encountered. IMPRESSION: Status post rescue of a displaced percutaneous gastrostomy, with placement of a 172French balloon retention gastric tube. Signed, JDulcy Fanny WEarleen Newport DO [facility] Electronically Signed   By: JCorrie MckusickD.O.   On: 10/14/2019 12:59     Medications:     Scheduled Medications: . acetaminophen (TYLENOL) oral liquid 160 mg/5 mL  650 mg Per Tube Q6H  . amLODipine  5 mg Per Tube Daily  . vitamin C  500 mg Per Tube Daily  . chlorhexidine gluconate (MEDLINE KIT)  15 mL Mouth Rinse BID  . Chlorhexidine Gluconate Cloth  6 each Topical Daily  . cloNIDine  0.2 mg Per Tube BID  . diatrizoate meglumine-sodium  30 mL Oral Once  . enoxaparin (LOVENOX) injection  60 mg Subcutaneous Q24H  . feeding supplement (OSMOLITE 1.5 CAL)  237 mL Per Tube 6 X Daily  . feeding supplement (PRO-STAT SUGAR FREE 64)  60  mL Per Tube QID  . free water  300 mL Per Tube Q6H  . Gerhardt's butt cream   Topical QID  . hydrALAZINE  100 mg Per Tube Q8H  . insulin aspart  0-20 Units Subcutaneous Q4H  . insulin aspart  5 Units Subcutaneous Q4H  . insulin detemir  12 Units Subcutaneous BID  . ipratropium-albuterol  3 mL Nebulization TID  . magic mouthwash  5 mL Oral TID  . mouth rinse  15 mL Mouth Rinse 10 times per day  . montelukast  10 mg Per Tube QHS  . nutrition supplement (JUVEN)  1 packet Per Tube BID BM  . pantoprazole (PROTONIX) IV  40 mg Intravenous BID  . propranolol  40 mg Per Tube TID  . psyllium  1 packet Per Tube BID  . QUEtiapine  50 mg Per Tube QHS  . sodium chloride flush  10-40 mL Intracatheter Q12H  . testosterone  5 g Transdermal Daily    Infusions: . sodium chloride    . sodium  chloride 250 mL (10/08/19 1626)  . sodium chloride Stopped (10/13/19 0548)  . dexmedetomidine (PRECEDEX) IV infusion 0.3 mcg/kg/hr (10/15/19 0818)  . meropenem (MERREM) IV Stopped (10/15/19 0202)    PRN Medications: Place/Maintain arterial line **AND** sodium chloride, sodium chloride, sodium chloride, acetaminophen (TYLENOL) oral liquid 160 mg/5 mL, acetaminophen, albuterol, [COMPLETED] diazepam **FOLLOWED BY** diazepam, hydrALAZINE, HYDROmorphone, magnesium hydroxide, midazolam, ondansetron (ZOFRAN) IV, senna-docusate, sodium chloride, sodium chloride flush   Assessment/Plan:    1. Acute hypoxic/hypercarbic respiratory failure due to COVID PNA: Remained markedly hypoxic despite full support. Intubated 12/26. S/p bilateral CTs 12/26 for pneumothoraces.  TEE on 12/26 LVEF 70% RV ok. VV ECMO begun 12/26. Tracheostomy 12/29.  COVID ARDS at this point. ECMO circuit changed 1/8 & 1/23. Crescent cannula placed 2/2 (femoral cannuals removed) - Decannulated 3/4 - Doing well with TC trials.  Continue to wean. - No chest x-ray for several days.  We will repeat - With post-cannulation fevers recultured and restarted abx and start steroids for possible post-decannulation SIRS response.  Now off steroids.  Continue antibiotics now for possible abdominal wall abscess.  Cultures no growth to date. Will stop VANC after 7 days - Completed 14 days  of meropenum on 2/28. Restarted 3/5 - Volume status improved. Off lasix gtt due to contraction alkalosis. Hold for now - Continue DVT dose heparin  2. COVID PNA: management of COVID infection per CCM. CXR with bilateral multifocal PNA progressing to likely ARDS.  - s/p 10 days of remdesivir - 12/16, 12/2 s/p tociluzimab - 12/17 s/p convalescent plasma - Decadron at 22m/daily completed 1/11  3. ID: Empiric coverage with vancomycin/cefepime initially for possible secondary bacterial PNA, course completed.   - treated with cefepime and vanc for + cx with staph  epidimidis, MSSA and serratia (sensitivities ok). - developed recurrent PNA and sepsis 2/15 - BAL with > 100K serratia. BAL growing > 100K serratia. Completed 14 days mero on 2/28 - Repeat bronch 3/3 looked good  -Continue antibiotics as above for possible abdominal wall abscess at PEG site. Vanc x 7 days  4. Anemia: - hgb 8.6. Follow  5. FEN - G-tube replaced by IR. Site improved  5. Hypernatremia -Resolved.  6. AKI - Resolved.   7. DTIs - On both feet.  These are improving  8.  Hypokalemia. -K 3.9  supp  9.  Diffuse muscle weakness. -This is consistent with ICU/steroid/paralytics myopathy.  Continue passive range of motion exercises with PT.  He is improving slowly.   10. HTN - BP now dropping back. Cut back anti-HTN meds  CRITICAL CARE Performed by: Glori Bickers  Total critical care time: 35 minutes  Critical care time was exclusive of separately billable procedures and treating other patients.  Critical care was necessary to treat or prevent imminent or life-threatening deterioration.  Critical care was time spent personally by me (independent of midlevel providers or residents) on the following activities: development of treatment plan with patient and/or surrogate as well as nursing, discussions with consultants, evaluation of patient's response to treatment, examination of patient, obtaining history from patient or surrogate, ordering and performing treatments and interventions, ordering and review of laboratory studies, ordering and review of radiographic studies, pulse oximetry and re-evaluation of patient's condition.    Length of Stay: 11     Glori Bickers MD 10/15/2019, 8:42 AM  Advanced Heart Failure Team Pager [phone] (M-F; 7a - 4p)  Please contact [facility] for night-coverage after hours (4p -7a ) and weekends on [email or web address]

## 2019-10-15 NOTE — Plan of Care (Signed)
  Problem: Respiratory: Goal: Ability to maintain a clear airway and adequate ventilation will improve Outcome: Progressing   Problem: Clinical Measurements: Goal: Will remain free from infection Outcome: Progressing    Problem: Coping: Goal: Level of anxiety will decrease Outcome: Progressing

## 2019-10-15 NOTE — Progress Notes (Signed)
Physical Therapy Treatment Patient Details Name: [name] MRN: [id number] DOB: 15-Dec-1975 Today's Date: 10/15/2019    History of Present Illness 44 y/o M admitted 12/14 with COVID pneumonia causing acute hypoxemic respiratory failure.   Developed pneumomediastinum 12/23 with concern for possible bilateral small pneumothoraces on 12/24. Pt intubated on 12/26 and started on ECMO on 12/27. Tracheostomy on 12/29 with bleeding from trach an dother sites on 12/31. Bronch on 1/11 and 1/15. Slowly weaning sedation 2/5. Continuing to wean sedation and starting sweep trials to wean ECMO - Decannulated 3/4    PT Comments    Patient progressing well towards PT goals. Pt awake and alert for session. Nodding appropriately to questions and following simple commands. Rolling to right/left with Max A of 2 to place pad. Tolerated transfer to chair with maxisky for the first time in a few months and tolerated this well. VSS. Pt on 40% Fi02, 10 LTC. Pt sitting in chair mouthing words and jamming out to music. Tolerated there ex of all extremities. Will continue to follow and progress as tolerated.   Follow Up Recommendations  LTACH     Equipment Recommendations  Other (comment)(defer)    Recommendations for Other Services       Precautions / Restrictions Precautions Precautions: Fall;Other (comment) Precaution Comments: weaning on trach collar Required Braces or Orthoses: Other Brace Other Brace: Bil Prevalon boots Restrictions Weight Bearing Restrictions: No    Mobility  Bed Mobility Overal bed mobility: Needs Assistance Bed Mobility: Rolling Rolling: Total assist;+2 for physical assistance         General bed mobility comments: Total A to roll to right/left to place pad with pt initiating movement with cues to turn head and reach with UEs.  Transfers Overall transfer level: Needs assistance Equipment used: Ambulation equipment used             General transfer comment:  transferred pt to chair with maxisky.  Ambulation/Gait                 Stairs             Wheelchair Mobility    Modified Rankin (Stroke Patients Only)       Balance Overall balance assessment: Needs assistance Sitting-balance support: Feet supported;Bilateral upper extremity supported Sitting balance-Leahy Scale: Fair Sitting balance - Comments: Able to sit up in chair with back supported and help adjust trunk with cues. Rocking out to music on computer.                                    Cognition Arousal/Alertness: Awake/alert Behavior During Therapy: WFL for tasks assessed/performed Overall Cognitive Status: Difficult to assess Area of Impairment: Attention;Following commands                   Current Attention Level: Selective   Following Commands: Follows one step commands consistently       General Comments: continues to demonstrate progress wtih cognition and processing speed. Follows all commands, nods appropriately to questions asked. Mouthing the words to [employer] play list. Laughing appropriately at jokes.      Exercises General Exercises - Upper Extremity Shoulder Flexion: AAROM;Both;10 reps;Supine Elbow Flexion: AAROM;PROM;Both;10 reps;Supine Elbow Extension: AAROM;AROM;Both;10 reps;Supine General Exercises - Lower Extremity Ankle Circles/Pumps: PROM;Both;10 reps;Supine Quad Sets: AAROM;Both;Supine;10 reps    General Comments General comments (skin integrity, edema, etc.): Pt on trach collar for session, 40% Fi02, 10L  02, Sats in 90s throughout.      Pertinent Vitals/Pain Pain Assessment: Faces Faces Pain Scale: No hurt    Home Living                      Prior Function            PT Goals (current goals can now be found in the care plan section) Progress towards PT goals: Progressing toward goals(slowly)    Frequency    Min 3X/week      PT Plan Current plan remains appropriate     Co-evaluation              AM-PAC PT "6 Clicks" Mobility   Outcome Measure  Help needed turning from your back to your side while in a flat bed without using bedrails?: Total Help needed moving from lying on your back to sitting on the side of a flat bed without using bedrails?: Total Help needed moving to and from a bed to a chair (including a wheelchair)?: Total Help needed standing up from a chair using your arms (e.g., wheelchair or bedside chair)?: Total Help needed to walk in hospital room?: Total Help needed climbing 3-5 steps with a railing? : Total 6 Click Score: 6    End of Session Equipment Utilized During Treatment: Oxygen(40% fi02,10L TC) Activity Tolerance: Patient tolerated treatment well Patient left: in chair;with call bell/phone within reach;with nursing/sitter in room Nurse Communication: Mobility status;Need for lift equipment PT Visit Diagnosis: Muscle weakness (generalized) (M62.81);Other symptoms and signs involving the nervous system (R29.898);Other abnormalities of gait and mobility (R26.89)     Time: 8341-9622 PT Time Calculation (min) (ACUTE ONLY): 21 min  Charges:  $Therapeutic Activity: 8-22 mins                     Marisa Severin, PT, DPT Acute Rehabilitation Services Pager [phone] Office [phone]       Marguarite Arbour A Sabra Heck 10/15/2019, 1:09 PM

## 2019-10-15 NOTE — Progress Notes (Addendum)
NAME:  [name], MRN:  [id number], DOB:  05/27/1976, LOS: 43 ADMISSION DATE:  07/22/2019, CONSULTATION DATE:  12/24 REFERRING MD:  Sloan Leiter, CHIEF COMPLAINT:  Dyspnea   Brief History   44 y/o male admitted 12/14 with COVID pneumonia leading to ARDS, pneumomediastinum.  Intubated 12/26, bilateral chest tubes placed.  Past Medical History  GERD HTN Asthma  Significant Hospital Events   12/14 admit with hypoxemic respiratory failure in setting of COVID-19 pneumonia 12/23 noted to have pneumomediastinum on chest x-ray 12/24PCCM consulted for evaluation 12/26 worsening hypoxemia, intubated 12/27 VV fem/fem ECMO cannulation 12/29 tracheostomy >> See bedside nursing event notes for full rundown of procedures after 12/27 1/5 epistaxis, nasal packing 1/30 improved cxr, improved lung compliance, TV 350s on 15PC  1/31 CXR increased infiltrates, +CFB     2/2 Converted to right IJ Crescent cannula. 2/3 PEG tube inserted at bedside 2/14 30 min sweep trial> paO2 105 (18 PEEP, 60% FiO2) pH 7.22, pCO2 87 2/20 2 hours sweep trial 2/21 1.5 hour sweep trial 2/24 7-hour sweep trial.  Increasingly awake. 3/2 - decannulation attempt aborted due to increased hypercapnia when sedated for procedure  Consults:  PCCM, CTS, CHF, Palliative  Procedures:  PICC 1/3 >> R radial a-line 1/20 >> R IJ ECMO cath 2/3 >> Tracheostomy 12/29 >> 10/14/2019 PEG tube replacement>> Significant Diagnostic Tests:  CT chest 12/22>>extensive pneumomediastinum, multifocal patchy bilateral groundglass opacities CXR 1/25 >> improving bilateral infiltrates. ECMO cannulae in place. CXR 2/5 >> clearing bilateral infiltrates  Micro Data:  BCx2 12/14 >>negative  Sputum 12/15 >> OPF 12/28 blood > negative 12/29 > staph aureus 1/4 bronch:  1/2 acid fast smear bronch: negative 1/2 fungal cx bronch: negative1/12 BAL: serratia, staph epidermidisu 1/4 BAL > MSSA, Serratia 1/9 blood > negative 1/9 Resp culture >  MSSA, serratia 1/11 serratia > negative 1/12 fungus culture > negative 1/12 BAL > serratia 1/18 BAL >> rare Serratia. 1/24 blood > negative 1/30 blood > negative 1/30 resp culture > serratia 2/1 BAL >> rare Serratia - enterococcus 2/1 aspergillus Ag > negative 2/2 resp culture > serratia  2/2 blood > negative 2/5 urine > negative 2/9 resp: serratia 2/15 BAL serratia  Antimicrobials:  Received remdesivir, steroids, actemra and convalescent plasma  5 days cefepime 12/24-12/29 Cefepime 1/4->1/13 vanc 1/4> 1/11 Ceftriaxone 1/14 > 1/27 vanc x1 1/20 Cefazolin 1/29 > 2/2 cefuroxim 2/2 vanc 2/2 Ceftriaxone x1 2/4 Meropenem 2/15>>2/21 Tobramycin inhaled 2/15>>2/21  Interim history/subjective:   Tolerated trach collar for approximately 3 hours  Objective   Blood pressure 121/75, pulse 85, temperature 98.8 F (37.1 C), temperature source Oral, resp. rate (!) 23, height _0  (1.727 m), weight 125.8 kg, SpO2 100 %.    Vent Mode: PSV;CPAP FiO2 (%):  [40 %] 40 % Set Rate:  [20 bmp] 20 bmp PEEP:  [8 cmH20] 8 cmH20 Pressure Support:  [10 cmH20] 10 cmH20 Plateau Pressure:  [22 cmH20-25 cmH20] 22 cmH20   Intake/Output Summary (Last 24 hours) at 10/15/2019 0757 Last data filed at 10/15/2019 0700 Gross per 24 hour  Intake 902.96 ml  Output 1401 ml  Net -498.04 ml   Filed Weights   10/13/19 0434 10/14/19 0400 10/15/19 0500  Weight: 131.7 kg 126.4 kg 125.8 kg    Examination: General: Appears much older than stated age of 68 HEENT: Currently on full vent support corrected unremarkable Neuro: Weak but able to follow commands and answer questions CV: Heart sounds are distant PULM: Diminished in the bases Vent pressure support CPAP  FIO2 40% PEEP 8 RATE 20+20 VT 560 GI: soft, bsx4 active PEG is in place  Extremities: warm/dry, multiple areas of soft tissue damage plus edema Skin: Multiple areas of breakdown     ABG    Component Value Date/Time   PHART 7.384 10/10/2019  1817   PCO2ART 63.7 (H) 10/10/2019 1817   PO2ART 76.0 (L) 10/10/2019 1817   HCO3 38.0 (H) 10/10/2019 1817   TCO2 40 (H) 10/10/2019 1817   ACIDBASEDEF 4.0 (H) 09/16/2019 2334   O2SAT 72.2 10/15/2019 0429     Resolved Hospital Problem list   Pneumothoraces-resolved.  Assessment & Plan:   ARDS due to COVID 19, requiring mechanical ventilation.  Decannulated from VV ECMO on 3/4 after 65 days. Wean per protocol.  Fever-unclear if this is cytokine response to's decannulation versus unidentified source of infection.  Stable SIRS from decannulation Dislodged PEG tube Indurated area of the skin right lateral PEG tube. Interventional radiology retrieved placed3/03/2020 Antimicrobial therapy continue  AKI with azotemia - improving  Positive cumulative fluid balance Hypernatremia has corrected Severe third spacing Recent Labs  Lab 10/14/19 0349 10/14/19 1615 10/15/19 0429  NA 142 139 142   Recent Labs  Lab 10/14/19 0349 10/14/19 1615 10/15/19 0429  K 3.1* 3.5 3.9   Lab Results  Component Value Date   CREATININE 0.61 10/15/2019   CREATININE 0.63 10/14/2019   CREATININE 0.59 (L) 10/14/2019     Intake/Output Summary (Last 24 hours) at 10/15/2019 0802 Last data filed at 10/15/2019 0700 Gross per 24 hour  Intake 874.93 ml  Output 1401 ml  Net -526.07 ml  Diuresis as tolerated Replete electrolytes as needed  Hypertension Continue as needed antihypertensives At sedation needed  Serratia pneumonia> treated.  No growth on culture data  Neuromuscular weakness Continue with PT and OT Out of bed as tolerated  Bilateral lower feet deep tissue injury Continue wound care Continue to monitor  Anemia due to critical illnes Recent Labs    10/14/19 0349 10/15/19 0429  HGB 9.5* 8.6*    Transfuse per protocol  Best practice:  Pain/Anxiety/Delirium protocol (if indicated): PRNs only VAP protocol (if indicated): Bundle in place. Respiratory support goals: Pressure  support CPAP trials. DVT prophylaxis: Lovenox GI prophylaxis: PPI Nutrition: TF Fluid status goals:  See above Urinary catheter: Guide hemodynamic management Glucose control: controlled with current regimen Mobility/therapy needs: Bedrest.  Daily labs: As needed Code Status: Full code  Disposition: ICU Family: 10/15/2019 patient updated at bedside  Labs   CBC: CBC Latest Ref Rng & Units 10/15/2019 10/14/2019 10/13/2019  WBC 4.0 - 10.5 K/uL 6.7 9.0 8.6  Hemoglobin 13.0 - 17.0 g/dL 8.6(L) 9.5(L) 8.4(L)  Hematocrit 39.0 - 52.0 % 29.8(L) 32.6(L) 29.0(L)  Platelets 150 - 400 K/uL 208 233 184     Basic Metabolic Panel: BMP Latest Ref Rng & Units 10/15/2019 10/14/2019 10/14/2019  Glucose 70 - 99 mg/dL 109(H) 158(H) 95  BUN 6 - 20 mg/dL 39(H) 36(H) 33(H)  Creatinine 0.61 - 1.24 mg/dL 0.61 0.63 0.59(L)  Sodium 135 - 145 mmol/L 142 139 142  Potassium 3.5 - 5.1 mmol/L 3.9 3.5 3.1(L)  Chloride 98 - 111 mmol/L 91(L) 89(L) 87(L)  CO2 22 - 32 mmol/L 41(H) 41(H) 44(H)  Calcium 8.9 - 10.3 mg/dL 8.5(L) 8.3(L) 8.6(L)     GFR: Estimated Creatinine Clearance: 150.7 mL/min (by C-G formula based on SCr of 0.61 mg/dL). Recent Labs  Lab 10/09/19 0411 10/09/19 1631 10/10/19 4401 10/10/19 1808 10/11/19 0272 10/11/19 0450 10/12/19 0431 10/13/19 5366  10/14/19 0349 10/15/19 0429  WBC 6.9   < > 6.2   < >   < >  --  6.0 8.6 9.0 6.7  LATICACIDVEN 1.1  --  0.9  --   --  1.2 1.2  --   --   --    < > = values in this interval not displayed.    Liver Function Tests: Recent Labs  Lab 10/10/19 0328  ALBUMIN 1.8*   No results for input(s): LIPASE, AMYLASE in the last 168 hours. No results for input(s): AMMONIA in the last 168 hours.  ABG    Component Value Date/Time   PHART 7.384 10/10/2019 1817   PCO2ART 63.7 (H) 10/10/2019 1817   PO2ART 76.0 (L) 10/10/2019 1817   HCO3 38.0 (H) 10/10/2019 1817   TCO2 40 (H) 10/10/2019 1817   ACIDBASEDEF 4.0 (H) 09/16/2019 2334   O2SAT 72.2 10/15/2019 0429      Coagulation Profile: No results for input(s): INR, PROTIME in the last 168 hours.  Cardiac Enzymes: No results for input(s): CKTOTAL, CKMB, CKMBINDEX, TROPONINI in the last 168 hours.  HbA1C: Hgb A1c MFr Bld  Date/Time Value Ref Range Status  09/23/2019 08:58 AM 5.5 4.8 - 5.6 % Final    Comment:    (NOTE) Pre diabetes:          5.7%-6.4% Diabetes:              >6.4% Glycemic control for   <7.0% adults with diabetes   07/24/2019 04:50 AM 6.7 (H) 4.8 - 5.6 % Final    Comment:    (NOTE) Pre diabetes:          5.7%-6.4% Diabetes:              >6.4% Glycemic control for   <7.0% adults with diabetes     CBG: Recent Labs  Lab 10/14/19 1552 10/14/19 2005 10/15/19 0019 10/15/19 0351 10/15/19 0738  GLUCAP 147* 113* 90 94 77    App cct 45 min  Richardson [name] Minor ACNP Acute Care Nurse Practitioner [place] Please consult [place] 10/15/2019, 7:57 AM    PCCM attending:  44 year old gentleman admitted for Covid pneumonia initially developed severe ARDS, ultimately cannulated on VV ECMO for 65 days.  Now slowly recovering.  She remains in the intensive care unit on mechanical support via tracheostomy tube.  BP 112/61   Pulse 78   Temp 99.2 F (37.3 C) (Oral)   Resp 20   Ht _0  (1.727 m)   Wt 125.8 kg   SpO2 100%   BMI 42.17 kg/m   General: Obese male resting in bed alert oriented. Neck: Large tracheostomy tube in place Heart: Regular rhythm S1-S2 Lungs: Bilateral mechanical ventilated breath sounds. Abdomen: Obese soft nondistended  Labs: Reviewed Chest x-ray: Reviewed  Assessment: Acute hypoxemic respiratory failure ARDS due to COVID-19 requiring mechanical ventilation and VV ECMO decannulated after 65 days. Dislodged PEG tube, replaced by IR. Volume overload, third spacing edema, improved. Bilateral neuromuscular weakness Bilateral lower feet deep tissue injury  Plan: Continue to work with PT OT. Out of bed to chair today if  possible. Trach collar trial as tolerated. Likely needs to be on mechanical support at night to rest.  This patient is critically ill with multiple organ system failure; which, requires frequent high complexity decision making, assessment, support, evaluation, and titration of therapies. This was completed through the application of advanced monitoring technologies and extensive interpretation of multiple databases. During this encounter critical care  time was devoted to patient care services described in this note for 32 minutes.   Garner Nash, DO [facility] 10/15/2019 3:30 PM

## 2019-10-15 NOTE — TOC Progression Note (Signed)
Transition of Care [facility]) - Progression Note    Patient Details  Name: TREVYN LUMPKIN MRN: [id number] Date of Birth: Aug 17, 1975  Transition of Care [facility]) CM/SW Contact  Graves-Bigelow, Ocie Cornfield, RN Phone Number: 10/15/2019, 3:16 PM  Clinical Narrative:   Case Manager received consult regarding calling the wife for possible disability for the patient.Case Manager did call the patient's wife and asked her to call the [place] for assistance for disability- to see if they can assist with the process. The patient has Christella Scheuermann; therefore, the financial counselor cannot assist due to insurance at this time. Per wife the patient has a long term disability policy via [place]. Case Manager reached out to [place] to see if she can reach out to wife regarding the policy- plan for [employer]. Case Manager will continue to follow for additional transition of care need   Expected Discharge Plan: Long Term Acute Care (LTAC) Barriers to Discharge: Continued Medical Work up(Continues on IV Rocephin, Nimbex and Precedex.)  Expected Discharge Plan and Services Expected Discharge Plan: [place] (LTAC)   Discharge Planning Services: CM Consult   Living arrangements for the past 2 months: Single Family Home                     Readmission Risk Interventions No flowsheet data found.

## 2019-10-15 NOTE — Progress Notes (Signed)
Daily Progress Note   Patient Name: [name]       Date: 10/15/2019 DOB: 09/05/1975  Age: 44 y.o. MRN#: [id number] Attending Physician: Wonda Olds, MD Primary Care Physician: Tamsen Roers, MD Admit Date: 07/22/2019  Reason for Consultation/Follow-up: Establishing goals of care  Subjective: Received call from patient's wife [name] She asked for support with insurance questions and disability. [name] stated she understands that patient has long path ahead of him for recovery. Noted he was able to tolerate transfer to chair today and sitting up, enjoyed listening to music.Recommendations made for LTACH. I gave [name] support and she discussed her son who is missing his Dad.  Noted patient progressing in hospitalization- tolerating trials of trach collar. When I saw patient he was alert- he expressed to me that he wanted to go back on vent (trach collar trial was in progress)  ROS  Length of Stay: 85  Current Medications: Scheduled Meds:  . acetaminophen (TYLENOL) oral liquid 160 mg/5 mL  650 mg Per Tube Q6H  . amLODipine  5 mg Per Tube Daily  . vitamin C  500 mg Per Tube Daily  . chlorhexidine gluconate (MEDLINE KIT)  15 mL Mouth Rinse BID  . Chlorhexidine Gluconate Cloth  6 each Topical Daily  . cloNIDine  0.2 mg Per Tube BID  . diatrizoate meglumine-sodium  30 mL Oral Once  . enoxaparin (LOVENOX) injection  60 mg Subcutaneous Q24H  . feeding supplement (OSMOLITE 1.5 CAL)  237 mL Per Tube 6 X Daily  . feeding supplement (PRO-STAT SUGAR FREE 64)  60 mL Per Tube QID  . free water  300 mL Per Tube Q6H  . Gerhardt's butt cream   Topical QID  . hydrALAZINE  50 mg Per Tube Q8H  . insulin aspart  0-20 Units Subcutaneous Q4H  . insulin aspart  5 Units Subcutaneous Q4H  .  insulin detemir  12 Units Subcutaneous BID  . ipratropium-albuterol  3 mL Nebulization TID  . magic mouthwash  5 mL Oral TID  . mouth rinse  15 mL Mouth Rinse 10 times per day  . montelukast  10 mg Per Tube QHS  . nutrition supplement (JUVEN)  1 packet Per Tube BID BM  . pantoprazole (PROTONIX) IV  40 mg Intravenous BID  . propranolol  20 mg Per  Tube TID  . psyllium  1 packet Per Tube BID  . QUEtiapine  50 mg Per Tube QHS  . sodium chloride flush  10-40 mL Intracatheter Q12H  . testosterone  5 g Transdermal Daily  . vancomycin variable dose per unstable renal function (pharmacist dosing)   Does not apply See admin instructions    Continuous Infusions: . sodium chloride    . sodium chloride 250 mL (10/08/19 1626)  . sodium chloride Stopped (10/13/19 0548)  . dexmedetomidine (PRECEDEX) IV infusion 0.3 mcg/kg/hr (10/15/19 1621)  . meropenem (MERREM) IV 2 g (10/15/19 1647)    PRN Meds: Place/Maintain arterial line **AND** sodium chloride, sodium chloride, sodium chloride, acetaminophen (TYLENOL) oral liquid 160 mg/5 mL, acetaminophen, albuterol, [COMPLETED] diazepam **FOLLOWED BY** diazepam, hydrALAZINE, HYDROmorphone, magnesium hydroxide, midazolam, ondansetron (ZOFRAN) IV, senna-docusate, sodium chloride, sodium chloride flush  Physical Exam Vitals and nursing note reviewed.  Cardiovascular:     Rate and Rhythm: Normal rate.  Pulmonary:     Effort: Pulmonary effort is normal.     Comments: On trach collar Neurological:     Mental Status: He is alert.             Vital Signs: BP (!) 119/57   Pulse 84   Temp 98.6 F (37 C)   Resp (!) 23   Ht 5' 8" (1.727 m)   Wt 125.8 kg   SpO2 100%   BMI 42.17 kg/m  SpO2: SpO2: 100 % O2 Device: O2 Device: Tracheostomy Collar O2 Flow Rate: O2 Flow Rate (L/min): 8 L/min  Intake/output summary:   Intake/Output Summary (Last 24 hours) at 10/15/2019 1648 Last data filed at 10/15/2019 1621 Gross per 24 hour  Intake 705.65 ml  Output 2101  ml  Net -1395.35 ml   LBM: Last BM Date: 10/15/19 Baseline Weight: Weight: 136.1 kg Most recent weight: Weight: 125.8 kg       Palliative Assessment/Data: PPS: 10%   Flowsheet Rows     Most Recent Value  Intake Tab  Referral Department  Critical care  Unit at Time of Referral  ICU  Palliative Care Primary Diagnosis  Cardiac  Date Notified  08/08/19  Palliative Care Type  New Palliative care  Reason for referral  -- [ecmo]  Date of Admission  07/22/19  Date first seen by Palliative Care  08/09/19  # of days Palliative referral response time  1 Day(s)  # of days IP prior to Palliative referral  17  Clinical Assessment  Psychosocial & Spiritual Assessment  Palliative Care Outcomes      Patient Active Problem List   Diagnosis Date Noted  . ARDS (adult respiratory distress syndrome) (HCC)   . Pressure injury of skin 08/22/2019  . Need for emotional support   . Chest tube in place   . Personal history of ECMO   . Advanced care planning/counseling discussion   . Acute respiratory distress syndrome (ARDS) due to COVID-19 virus (HCC)   . Palliative care by specialist   . Goals of care, counseling/discussion   . Advanced directives, counseling/discussion   . Subcutaneous crepitus   . Pneumonia due to COVID-19 virus 07/23/2019  . Acute respiratory failure with hypoxia (HCC) 07/22/2019  . Persistent asthma with undetermined severity   . Thrombocytopenia (HCC)   . Leukopenia   . Fever   . Gram positive bacterial infection   . Septic arthritis of right acromioclavicular joint (HCC) 09/29/2017  . Chronic left-sided low back pain with left-sided sciatica 09/19/2017  . Epigastric pain   .   Pancreatitis 04/17/2017  . Essential hypertension 04/17/2017  . Moderate persistent asthma 07/21/2015  . Allergic rhinoconjunctivitis 07/21/2015  . GERD (gastroesophageal reflux disease) 07/21/2015  . ABNORMALITY OF GAIT 11/12/2009  . TARSAL TUNNEL SYNDROME, LEFT 10/08/2009  . PES PLANUS  10/08/2009    Palliative Care Assessment & Plan   Patient Profile: [name] admitted 12/14 with COVIDpneumonia causing acutehypoxemic respiratory failure. Developed pneumomediastinum 12/23 with concern for possible bilateral small pneumothoraces on 12/24. PCCM consulted for evaluation of pneumothoraces.During admission patient has had placement of bilateral chest tubes, and is now on VV ECMO, and vent by trach. Recovery has been complicated by ongoing bleeding- frank blood in chest tubes, bleeding from around trach site, epistaxis- has received multiple blood transfusions, platelet transfusion. Also received albumin for volume expansion due to low flow after receiving diuresis- diuresis now on hold. Palliative medicine consulted for support and GOC.Patient now decannulated from ECMO- continues on vent through trach, tolerating intermittent trach collar trials.   Assessment/Recommendations/Plan  Referral made to TOC to address spouse's questions regarding insurance and applications for disability- I also forwarded information to her via email from SW- appreciate all assistance PMT will continue to shadow and see patient intermittently- with plans for LTACH would likely benefit revisiting goals of care and discussing issues and decision making that may occur in the future- recommend continued follow by Palliative at LTACH  Goals of Care and Additional Recommendations: Limitations on Scope of Treatment: Full Scope Treatment  Code Status: Full code  Prognosis:  Unable to determine  Discharge Planning: LTACH  Care plan was discussed with [name]- patient's spouse.   Thank you for allowing the Palliative Medicine Team to assist in the care of this patient.   Time In: 1500 Time Out: 1535 Total Time 35 mins Prolonged Time Billed no      Greater than 50%  of this time was spent counseling and coordinating care related to the above assessment and plan.   , AGNP-C Palliative  Medicine   Please contact Palliative Medicine Team phone at [phone] for questions and concerns.        

## 2019-10-15 NOTE — Progress Notes (Signed)
Patient placed back on the vent from ATC. RN made aware. Vitals are stable.

## 2019-10-15 NOTE — Progress Notes (Addendum)
Pharmacy Antibiotic Note  [name] is a 43 y.o. male admitted on 07/22/2019 with COVID respiratory distress, s/p ECMO- decannulated 3/4 with concern for SIRS like reaction now afebrile with a WBC count wnl. Antibiotics currently indicated for abdominal wall abscess. Pharmacy has been consulted for vancomycin and meropenem dosing.  Renal function has been stable ~0.6, today at 0.61 with an estimated CrCl ~150 ml/min. Urine function is charted to have decreased with ~0.4-5 ml/kg/hour.   Vancomycin peak level 3/8 originally reported as 38, then lab corrected to 71 due to issue with reagent. Then trough level reported as 39.  Levels timed/collected appropriately. Random level 3/9 at 0145 is 30, repeat random level 3/9 at 0800 is 22 which pharmacokinetically makes sense.   Calculated AUC with peak and trough from 3/8 is within therapeutic range of 435.   Plan: Goal AUC 400-550 Vancomycin 1000 mg IV x 1 then pulse dosing on levels  Continue meropenem 2gm IV q8h x48hr Plan for 7 days of vancomycin per MD (today is Day 5) Follow up a random vancomycin level tomorrow morning with labs Follow renal function, clinical improvement  Height: _0  (172.7 cm) Weight: 277 lb 5.4 oz (125.8 kg) IBW/kg (Calculated) : 68.4  Temp (24hrs), Avg:99.4 F (37.4 C), Min:98.7 F (37.1 C), Max:100.7 F (38.2 C)  Recent Labs  Lab 10/09/19 0411 10/09/19 0919 10/10/19 0328 10/10/19 1808 10/11/19 0442 10/11/19 0442 10/11/19 0450 10/12/19 0431 10/13/19 0351 10/14/19 0349 10/14/19 1615 10/14/19 2030 10/15/19 0145 10/15/19 0429  WBC 6.9   < > 6.2   < > 8.3  --   --  6.0 8.6 9.0  --   --   --  6.7  CREATININE  --    < > 0.61   < > 0.62   < >  --  0.68 0.64 0.59* 0.63  --   --  0.61  LATICACIDVEN 1.1  --  0.9  --   --   --  1.2 1.2  --   --   --   --   --   --   VANCOTROUGH  --   --   --   --   --   --   --   --   --   --   --  12*  --   --   VANCOPEAK  --   --   --   --   --   --   --   --   --   --   18*  --   --   --   VANCORANDOM  --   --   --   --   --   --   --   --   --   --   --   --  30  --    < > = values in this interval not displayed.    Estimated Creatinine Clearance: 150.7 mL/min (by C-G formula based on SCr of 0.61 mg/dL).    No Known Allergies  Antimicrobials this admission: Remdesivir 12/14>>12/23 Actemra x1 12/16; 12/22 Plasma 12/17 Vanco 12/26 >>12/28, 1/4>>1/13, 2/15>>2/17; 3/5 >> Cefepime 12/26 >>12/30, 1/4>>1/13 Meropenem 2/15>>2/28; 3/5 >> Tobramycin (neb) 2/16>>2/23 Ctx 1/15>> (nose packing)1/28 restarted for 1 dose when changed foley 2/5 Ancef nasal packing 1/28>2/3   Microbiology results: 12/14 BCx: ngF 12/14 sputum: Normal respiratory flora  12/25 MRSA PCR neg  12/28 trach: few MSSA 12/28 BCx: ngF 1/2 bronch: >100 k serratia marcescens (R cefazolin), >  100 k staph epidermidis (S vanc, tetra) 1/4 BAL: MSSA, serratia marcescens (R cefazolin) 1/9 sputum: serratia/staph 1/9 bldx2 -ng 1/11 BAL: serratia 1/12 BAL: serratia 1/18 BAL: serratia - S cefepime 1/24 BCx x 2: ngF 1/30 Bcx: ngF 1/30 TA: mod serratia (R cefazolin and CTX) 2/1 BAL: 30k serratia (R- cefazolin), 10k enterococcus 2/2 Sputum: few GNR, rare GPRs 2/2 Bcx x2: ngF 2/2 UCx: ngF 2/5 UCx: ngF 2/9 BAL: mod serratia, rare MSSA 2/15 Bcx x2: ng 2/15 BAL: >100k serratia - only R cefazolin 2/20 MRSA PCR: neg 3/3 resp cx: negative 3/5 Bcx: ngtd  Thank you,   Eddie Candle, PharmD PGY-1 Pharmacy Resident   Please check amion for clinical pharmacist contact number

## 2019-10-16 ENCOUNTER — Inpatient Hospital Stay (HOSPITAL_COMMUNITY): Payer: Managed Care, Other (non HMO)

## 2019-10-16 DIAGNOSIS — Z8616 Personal history of COVID-19: Secondary | ICD-10-CM

## 2019-10-16 DIAGNOSIS — J939 Pneumothorax, unspecified: Secondary | ICD-10-CM

## 2019-10-16 LAB — TYPE AND SCREEN
ABO/RH(D): O POS
Antibody Screen: NEGATIVE
Unit division: 0
Unit division: 0
Unit division: 0
Unit division: 0

## 2019-10-16 LAB — CULTURE, BLOOD (ROUTINE X 2)
Culture: NO GROWTH
Culture: NO GROWTH
Special Requests: ADEQUATE
Special Requests: ADEQUATE

## 2019-10-16 LAB — BPAM RBC
Blood Product Expiration Date: 202104022359
Blood Product Expiration Date: 202104032359
Blood Product Expiration Date: 202104032359
Blood Product Expiration Date: 202104032359
Unit Type and Rh: 5100
Unit Type and Rh: 5100
Unit Type and Rh: 5100
Unit Type and Rh: 5100

## 2019-10-16 LAB — GLUCOSE, CAPILLARY
Glucose-Capillary: 81 mg/dL (ref 70–99)
Glucose-Capillary: 87 mg/dL (ref 70–99)
Glucose-Capillary: 121 mg/dL — ABNORMAL HIGH (ref 70–99)
Glucose-Capillary: 76 mg/dL (ref 70–99)
Glucose-Capillary: 93 mg/dL (ref 70–99)
Glucose-Capillary: 107 mg/dL — ABNORMAL HIGH (ref 70–99)

## 2019-10-16 LAB — MAGNESIUM: Magnesium: 2.1 mg/dL (ref 1.7–2.4)

## 2019-10-16 LAB — COOXEMETRY PANEL
Carboxyhemoglobin: 3.3 % — ABNORMAL HIGH (ref 0.5–1.5)
Methemoglobin: 0.8 % (ref 0.0–1.5)
O2 Saturation: 77.8 %
Total hemoglobin: 12.8 g/dL (ref 12.0–16.0)

## 2019-10-16 LAB — CBC
HCT: 30.6 % — ABNORMAL LOW (ref 39.0–52.0)
Hemoglobin: 8.8 g/dL — ABNORMAL LOW (ref 13.0–17.0)
MCH: 27.8 pg (ref 26.0–34.0)
MCHC: 28.8 g/dL — ABNORMAL LOW (ref 30.0–36.0)
MCV: 96.5 fL (ref 80.0–100.0)
Platelets: 231 10*3/uL (ref 150–400)
RBC: 3.17 MIL/uL — ABNORMAL LOW (ref 4.22–5.81)
RDW: 16 % — ABNORMAL HIGH (ref 11.5–15.5)
WBC: 5.5 10*3/uL (ref 4.0–10.5)
nRBC: 0 % (ref 0.0–0.2)

## 2019-10-16 LAB — BASIC METABOLIC PANEL
Anion gap: 11 (ref 5–15)
BUN: 45 mg/dL — ABNORMAL HIGH (ref 6–20)
CO2: 38 mmol/L — ABNORMAL HIGH (ref 22–32)
Calcium: 8.7 mg/dL — ABNORMAL LOW (ref 8.9–10.3)
Chloride: 92 mmol/L — ABNORMAL LOW (ref 98–111)
Creatinine, Ser: 0.54 mg/dL — ABNORMAL LOW (ref 0.61–1.24)
GFR calc Af Amer: >60 mL/min (ref >60)
GFR calc non Af Amer: >60 mL/min (ref >60)
Glucose, Bld: 115 mg/dL — ABNORMAL HIGH (ref 70–99)
Potassium: 3.9 mmol/L (ref 3.5–5.1)
Sodium: 141 mmol/L (ref 135–145)

## 2019-10-16 LAB — PHOSPHORUS: Phosphorus: 3 mg/dL (ref 2.5–4.6)

## 2019-10-16 LAB — VANCOMYCIN, RANDOM: Vancomycin Rm: 18

## 2019-10-16 MED ORDER — FUROSEMIDE 10 MG/ML IJ SOLN
40.0000 mg | Freq: Once | INTRAMUSCULAR | Status: AC
  Administered 2019-10-16: 40 mg via INTRAVENOUS
  Filled 2019-10-16: qty 4

## 2019-10-16 MED ORDER — POTASSIUM CHLORIDE 20 MEQ/15ML (10%) PO SOLN
20.0000 meq | Freq: Once | ORAL | Status: AC
  Administered 2019-10-16: 20 meq via ORAL
  Filled 2019-10-16: qty 15

## 2019-10-16 MED ORDER — DICLOFENAC SODIUM 1 % EX GEL
2.0000 g | Freq: Four times a day (QID) | CUTANEOUS | Status: DC
  Administered 2019-10-16 – 2019-10-20 (×16): 2 g via TOPICAL
  Filled 2019-10-16: qty 100

## 2019-10-16 MED ORDER — VANCOMYCIN HCL IN DEXTROSE 1-5 GM/200ML-% IV SOLN
1000.0000 mg | Freq: Two times a day (BID) | INTRAVENOUS | Status: AC
  Administered 2019-10-16 – 2019-10-17 (×4): 1000 mg via INTRAVENOUS
  Filled 2019-10-16 (×4): qty 200

## 2019-10-16 NOTE — Progress Notes (Addendum)
Occupational Therapy Treatment Patient Details Name: [name] MRN: [id number] DOB: 08/30/1975 Today's Date: 10/16/2019    History of present illness 44 y/o M admitted 12/14 with COVID pneumonia causing acute hypoxemic respiratory failure.   Developed pneumomediastinum 12/23 with concern for possible bilateral small pneumothoraces on 12/24. Pt intubated on 12/26 and started on ECMO on 12/27. Tracheostomy on 12/29 with bleeding from trach an dother sites on 12/31. Bronch on 1/11 and 1/15. Slowly weaning sedation 2/5. Continuing to wean sedation and starting sweep trials to wean ECMO - Decannulated 3/4   OT comments  Pt seen for additional session to issue BUE strengthening exercises/equipment. Tied B theraband to bed rails and assisted pt with exercises as detailed below. Pt will need full supervision with theraband and AAROM assistance. RN educated. Also issued pt [place] ball to begin initiating functional grip strength. Pt nodding in understanding. Offered pt repositioning for comfort and oral suction due to secretion mobilization with brief exercise. D/c recs remain appropriate. Will continue to follow.    Follow Up Recommendations  LTACH    Equipment Recommendations  None recommended by OT    Recommendations for Other Services      Precautions / Restrictions Precautions Precautions: Fall;Other (comment) Precaution Comments: trach collar Required Braces or Orthoses: Other Brace Other Brace: Bil Prevalon boots; Bil darco shoes Restrictions Weight Bearing Restrictions: No       Mobility Bed Mobility Overal bed mobility: Needs Assistance Bed Mobility: Rolling;Supine to Sit;Sit to Supine Rolling: Total assist   Supine to sit: Max assist;+2 for physical assistance;+2 for safety/equipment Sit to supine: Max assist;+2 for physical assistance;+2 for safety/equipment   General bed mobility comments: total to roll for placement of sling  Transfers Overall transfer level: Needs  assistance Equipment used: Ambulation equipment used             General transfer comment: transferred pt to chair with maxisky    Balance Overall balance assessment: Needs assistance Sitting-balance support: Bilateral upper extremity supported;Feet supported Sitting balance-Leahy Scale: Poor Sitting balance - Comments: maxA with BUE support of bed                                   ADL either performed or assessed with clinical judgement   ADL Overall ADL's : Needs assistance/impaired Eating/Feeding: NPO   Grooming: Maximal assistance;Wash/dry face;Oral care Grooming Details (indicate cue type and reason): assist for grip strength, maintaining arms up against gravity                               General ADL Comments: session focused on therapeutic exercise; issued [facility] ball and theraband     Vision   Vision Assessment?: No apparent visual deficits   Perception     Praxis      Cognition Arousal/Alertness: Awake/alert Behavior During Therapy: WFL for tasks assessed/performed Overall Cognitive Status: Difficult to assess Area of Impairment: Attention;Following commands;Awareness;Problem solving                   Current Attention Level: Selective   Following Commands: Follows one step commands consistently   Awareness: Emergent Problem Solving: Slow processing General Comments: following one step commands consistently, but continuing to need increased problem solving support        Exercises Other Exercises Other Exercises: AAROM/AROM: shoulder shrugs x5; elbow flexion/extension x5; shoulder flexion x5 Other  Exercises: AAROM with theraband: shoulder flexion, shoulder adduction, elbow flexion x5 Other Exercises: [employer] ball given starting with LUE, RN instructed to move to RUE after ~30 mins   Shoulder Instructions       General Comments      Pertinent Vitals/ Pain       Pain Assessment: No/denies pain Faces Pain Scale:  Hurts whole lot Pain Location: bilateral knees Pain Descriptors / Indicators: Grimacing Pain Intervention(s): Limited activity within patient's tolerance  Home Living                                          Prior Functioning/Environment              Frequency  Min 3X/week        Progress Toward Goals  OT Goals(current goals can now be found in the care plan section)  Progress towards OT goals: Progressing toward goals  Acute Rehab OT Goals Patient Stated Goal: per wife for pt to get stronger OT Goal Formulation: With family Time For Goal Achievement: 10/30/19 Potential to Achieve Goals: [place] Discharge plan remains appropriate    Co-evaluation    PT/OT/SLP Co-Evaluation/Treatment: Yes Reason for Co-Treatment: Necessary to address cognition/behavior during functional activity;For patient/therapist safety;To address functional/ADL transfers PT goals addressed during session: Mobility/safety with mobility;Balance;Proper use of DME OT goals addressed during session: ADL's and self-care;Strengthening/ROM      AM-PAC OT "6 Clicks" Daily Activity     Outcome Measure   Help from another person eating meals?: Total Help from another person taking care of personal grooming?: A Lot Help from another person toileting, which includes using toliet, bedpan, or urinal?: Total Help from another person bathing (including washing, rinsing, drying)?: Total Help from another person to put on and taking off regular upper body clothing?: Total Help from another person to put on and taking off regular lower body clothing?: Total 6 Click Score: 7    End of Session Equipment Utilized During Treatment: Other (comment)  OT Visit Diagnosis: Muscle weakness (generalized) (M62.81);Cognitive communication deficit (R41.841)   Activity Tolerance Patient tolerated treatment well   Patient Left in bed;with call bell/phone within reach;with bed alarm set   Nurse  Communication Mobility status        Time: 1530-1540 OT Time Calculation (min): 10 min  Charges: OT General Charges $OT Visit: 1 Visit OT Treatments $Self Care/Home Management : 8-22 mins $Therapeutic Exercise: 8-22 mins  Zenovia Jarred, MSOT, OTR/L Acute Rehabilitation Services [facility] Office Number: [phone] Pager: [phone]  Zenovia Jarred 10/16/2019, 3:58 PM

## 2019-10-16 NOTE — Progress Notes (Signed)
Occupational Therapy Treatment Patient Details Name: [name] MRN: [id number] DOB: [birth year] Today's Date: 10/16/2019    History of present illness 44 y/o M admitted 12/14 with COVID pneumonia causing acute hypoxemic respiratory failure.   Developed pneumomediastinum 12/23 with concern for possible bilateral small pneumothoraces on 12/24. Pt intubated on 12/26 and started on ECMO on 12/27. Tracheostomy on 12/29 with bleeding from trach an dother sites on 12/31. Bronch on 1/11 and 1/15. Slowly weaning sedation 2/5. Continuing to wean sedation and starting sweep trials to wean ECMO - Decannulated 3/4   OT comments  Pt making progress toward OT goals, focused sessio non OOB mobility to progress functional tolerance for BADL. Pt completed supine <> sit with max A +2 to sit EOB. Once EOB pt engaged in BUE exercise. Noted increased weakness in flexors globally with BUE. Will continue to progress and follow for this. Pt able to tolerate sitting EOB ~ 5 minutes. Pt laid back down with total A +2 for sling placement, where maxisky was used to get to chair. Pt appropriately following simple commands this date, smiling along with therapists. VSS on trach collar at 10L/min with 40% FiO2. D/c recs remain appropriate, will follow.    Follow Up Recommendations  LTACH    Equipment Recommendations  None recommended by OT    Recommendations for Other Services      Precautions / Restrictions Precautions Precautions: Fall;Other (comment) Precaution Comments: trach collar Required Braces or Orthoses: Other Brace Other Brace: Bil Prevalon boots; Bil darco shoes Restrictions Weight Bearing Restrictions: No       Mobility Bed Mobility Overal bed mobility: Needs Assistance Bed Mobility: Rolling;Supine to Sit;Sit to Supine Rolling: Total assist   Supine to sit: Max assist;+2 for physical assistance;+2 for safety/equipment Sit to supine: Max assist;+2 for physical assistance;+2 for  safety/equipment   General bed mobility comments: total to roll for placement of sling  Transfers Overall transfer level: Needs assistance Equipment used: Ambulation equipment used             General transfer comment: transferred pt to chair with maxisky    Balance Overall balance assessment: Needs assistance Sitting-balance support: Bilateral upper extremity supported;Feet supported Sitting balance-Leahy Scale: Poor Sitting balance - Comments: maxA with BUE support of bed Postural control: Posterior lean;Left lateral lean(left lean due to bed)                                 ADL either performed or assessed with clinical judgement   ADL Overall ADL's : Needs assistance/impaired Eating/Feeding: NPO   Grooming: Maximal assistance;Wash/dry face;Oral care Grooming Details (indicate cue type and reason): assist for grip strength, maintaining arms up against gravity                               General ADL Comments: pt able to progress to max A for grooming tasks, but overall needing total A for other BADLs. Session focused on OOB mobility for respiratory health and increasing functional tolerance for BADL progression     Vision   Vision Assessment?: No apparent visual deficits   Perception     Praxis      Cognition Arousal/Alertness: Awake/alert Behavior During Therapy: WFL for tasks assessed/performed Overall Cognitive Status: Difficult to assess Area of Impairment: Attention;Following commands;Awareness;Problem solving  Current Attention Level: Selective   Following Commands: Follows one step commands consistently   Awareness: Emergent Problem Solving: Slow processing General Comments: following one step commands consistently, but continuing to need increased problem solving support        Exercises Exercises: Other exercises Other Exercises Other Exercises: Shoulder shrugs x 5 Other Exercises: Elbow  flexion AAROM bilaterally x5 Other Exercises: elbow extension AROM bilaterally x5 Other Exercises: shoulder flexion AAROM bilaterally x 5   Shoulder Instructions       General Comments VSS on trach collar 10 LPM 40% FiO2    Pertinent Vitals/ Pain       Pain Assessment: Faces Faces Pain Scale: Hurts whole lot Pain Location: bilateral knees Pain Descriptors / Indicators: Grimacing Pain Intervention(s): Limited activity within patient's tolerance  Home Living                                          Prior Functioning/Environment              Frequency  Min 3X/week        Progress Toward Goals  OT Goals(current goals can now be found in the care plan section)  Progress towards OT goals: Progressing toward goals  Acute Rehab OT Goals Patient Stated Goal: per wife for pt to get stronger OT Goal Formulation: With family Time For Goal Achievement: 10/18/19 Potential to Achieve Goals: [place] Discharge plan remains appropriate    Co-evaluation    PT/OT/SLP Co-Evaluation/Treatment: Yes Reason for Co-Treatment: Necessary to address cognition/behavior during functional activity;For patient/therapist safety;To address functional/ADL transfers PT goals addressed during session: Mobility/safety with mobility;Balance;Proper use of DME OT goals addressed during session: ADL's and self-care;Strengthening/ROM      AM-PAC OT "6 Clicks" Daily Activity     Outcome Measure   Help from another person eating meals?: Total Help from another person taking care of personal grooming?: A Lot Help from another person toileting, which includes using toliet, bedpan, or urinal?: Total Help from another person bathing (including washing, rinsing, drying)?: Total Help from another person to put on and taking off regular upper body clothing?: Total Help from another person to put on and taking off regular lower body clothing?: Total 6 Click Score: 7    End of  Session Equipment Utilized During Treatment: Other (comment)  OT Visit Diagnosis: Muscle weakness (generalized) (M62.81);Cognitive communication deficit (R41.841)   Activity Tolerance Patient tolerated treatment well   Patient Left in chair;with call bell/phone within reach;with chair alarm set   Nurse Communication Mobility status;Need for lift equipment        Time: 2336-1224 OT Time Calculation (min): 28 min  Charges: OT General Charges $OT Visit: 1 Visit OT Treatments $Self Care/Home Management : 8-22 mins  Zenovia Jarred, MSOT, OTR/L Acute Rehabilitation Services [facility] Office Number: [phone] Pager: [phone]  Zenovia Jarred 10/16/2019, 2:25 PM

## 2019-10-16 NOTE — Progress Notes (Signed)
Nutrition Follow up  DOCUMENTATION CODES:   Obesity unspecified  INTERVENTION:   Continue bolus feedings:  -1 can Osmolite 1.5 six times daily (0000, 0400, 0800, 1200, 1600, 2000) -60 ml Prostat QID -Juven BID -Free water flushes 300 ml Q6 hours- per CCM  Provides: 3090 kcals, 214 grams protein, 1086 ml free water (2286 ml with flushes). Meets 100% of needs.   NUTRITION DIAGNOSIS:   Increased nutrient needs related to acute illness(COVID PNA) as evidenced by estimated needs.  Ongoing  GOAL:   Patient will meet greater than or equal to 90% of their needs   Addressed via TF  MONITOR:   Diet advancement, Skin, TF tolerance, Weight trends, Labs, I & O's  REASON FOR ASSESSMENT:   Consult Enteral/tube feeding initiation and management  ASSESSMENT:   Patient with PMH significant for GERD, HTN, and asthma. Presents this admission with COVID-19 PNA.   12/14- admit 12/23- chest xray revealed pneumomediastinum  12/24- developed bilateral pneumothoraces  12/26- worsening hypoxemia, intubated  12/27- s/p VV fem/fem ECMO cannulation  12/29- s/p trach  1/5- nasal bleeding- packing per CCM 1/7- nasal packing per ENT 1/8- trach exchanged, ECMO circuit changed  1/20- s/p additional RIJ drainage cannula  1/21- Cortrak kinked, replaced by OGT, pt vomited over night 1/22- ECMO circuit exchanged  1/23- nasal endoscopy, control of epistaxis  2/2- TEE, Cortrak removed, change to crescent catheter, removal of femoral lines 2/3- s/p PEG, EGD 2/15- developed recurrent PNA/sepsis  3/2- decannulation cancelled 3/4- s/p decannulation VV ECMO 3/7- PEG dislodged  3/8- s/p PEG replacement by IR   Pt discussed during ICU rounds and with RN.   Afebrile. On trach collar for several hours yesterday. Plan to start [place] soon. Bolus feedings restarted on 3/9 after PEG replacement. Tolerating well. Wounds stable.   Admission weight: 136.1 kg  Current weight: 127.4 kg  I/O: -10,327 ml since  2/24  UOP: 2,300 ml x 24 hrs   Drips: precedex Medications: 500 mg Vitamin C, SS novolog, levemir, metamucil Labs: CBG 87-121  Diet Order:   Diet Order            Diet NPO time specified  Diet effective midnight              EDUCATION NEEDS:   Not appropriate for education at this time  Skin:  Skin Assessment: Skin Integrity Issues: Skin Integrity Issues:: DTI, Stage II, Stage III DTI: bilateral heels Stage II: buttocks Stage III: neck from trach ties Incisions: L wrist, nose Other: n/a  Last BM:  3/10  Height:   Ht Readings from Last 1 Encounters:  08/09/19 5' 8"  (1.727 m)    Weight:   Wt Readings from Last 1 Encounters:  10/16/19 127.4 kg    Ideal Body Weight:  70 kg  BMI:  Body mass index is 42.71 kg/m.  Estimated Nutritional Needs:   Kcal:  1683-7290 kcal (25-30 kcal/kg)  Protein:  180-215 grams  Fluid:  >/= 2.5 L/day   [name] RD, LDN Clinical Nutrition Pager # - [phone]

## 2019-10-16 NOTE — Evaluation (Signed)
[facility] - Evaluation Patient Details  Name: [name] MRN: [id number] Date of Birth: [birth year]  Today's Date: 10/16/2019 Time: 53-1440 SLP Time Calculation (min) (ACUTE ONLY): 25 min  Past Medical History:  Past Medical History:  Diagnosis Date  . Asthma   . GERD (gastroesophageal reflux disease)   . Headache   . History of kidney stones    LEFT URETERAL STONE  . HTN (hypertension)   . Pancreatitis 2018   GALLBALDDER SLUDGE CAUSED ISSUED RESOLVED   Past Surgical History:  Past Surgical History:  Procedure Laterality Date  . CANNULATION FOR ECMO (EXTRACORPOREAL MEMBRANE OXYGENATION) N/A 08/28/2019   Procedure: CANNULATION FOR VV ECMO (EXTRACORPOREAL MEMBRANE OXYGENATION);  Surgeon: Prescott Gum, Collier Salina, MD;  Location: [place];  Service: Open Heart Surgery;  Laterality: N/A;  CRESCENT CANNULA  . CANNULATION FOR ECMO (EXTRACORPOREAL MEMBRANE OXYGENATION) N/A 09/10/2019   Procedure: CANNULATION FOR ECMO (EXTRACORPOREAL MEMBRANE OXYGENATION) PUTTING IN CRESCENT 32FR CANNULA  AND REMOVING GROING CANNULATION;  Surgeon: Wonda Olds, MD;  Location: [place];  Service: Open Heart Surgery;  Laterality: N/A;  PUTTING IN CRESCENT/REMOVING GROIN CANNULATION  . CYSTOSCOPY/URETEROSCOPY/HOLMIUM LASER/STENT PLACEMENT Left 04/18/2019   Procedure: LEFT URETEROSCOPY/HOLMIUM LASER/STENT PLACEMENT;  Surgeon: Ardis Hughs, MD;  Location[facility];  Service: Urology;  Laterality: Left;  . CYSTOSCOPY/URETEROSCOPY/HOLMIUM LASER/STENT PLACEMENT Left 05/02/2019   Procedure: CYSTOSCOPY/URETEROSCOPY/HOLMIUM LASER/STENT EXCHANGE;  Surgeon: Ardis Hughs, MD;  Location: [facility];  Service: Urology;  Laterality: Left;  . ECMO CANNULATION N/A 08/03/2019   Procedure: ECMO CANNULATION;  Surgeon: Wonda Olds, MD;  Location: [place] [facility];  Service: Cardiovascular;  Laterality: N/A;  . ESOPHAGOGASTRODUODENOSCOPY N/A 09/11/2019   Procedure: ESOPHAGOGASTRODUODENOSCOPY  (EGD);  Surgeon: Wonda Olds, MD;  Location: [place];  Service: Thoracic;  Laterality: N/A;  . IR [place] Vivianne Master  10/14/2019  . IRRIGATION AND DEBRIDEMENT SHOULDER Right 09/29/2017   Procedure: IRRIGATION AND DEBRIDEMENT SHOULDER;  Surgeon: Leandrew Koyanagi, MD;  Location: [place];  Service: Orthopedics;  Laterality: Right;  . [place] SURGERY  2002  . NASAL ENDOSCOPY WITH EPISTAXIS CONTROL N/A 08/31/2019   Procedure: NASAL ENDOSCOPY WITH EPISTAXIS CONTROL WITH CAUTERIZATION;  Surgeon: Melida Quitter, MD;  Location: [place];  Service: ENT;  Laterality: N/A;  . PORTACATH PLACEMENT N/A 09/11/2019   Procedure: PEG TUBE INSERTION - BEDSIDE;  Surgeon: Wonda Olds, MD;  Location: [place];  Service: Thoracic;  Laterality: N/A;  . TEE WITHOUT CARDIOVERSION N/A 08/28/2019   Procedure: TRANSESOPHAGEAL ECHOCARDIOGRAM (TEE);  Surgeon: Prescott Gum, Collier Salina, MD;  Location: [place];  Service: Open Heart Surgery;  Laterality: N/A;  . TEE WITHOUT CARDIOVERSION N/A 09/10/2019   Procedure: TRANSESOPHAGEAL ECHOCARDIOGRAM (TEE);  Surgeon: Wonda Olds, MD;  Location: [place];  Service: Open Heart Surgery;  Laterality: N/A;   HPI:  Pt is a 44 y/o male admitted 12/14 with COVID pneumonia leading to ARDS and needing mechanical ventilation, pneumomediastinum. He was intubated 12/26-12/29 with trach placement on latter date. PEG tube done on 09/11/19 and replaced on 10/14/19 after dislodging. Decannulated from VV ECMO on 3/4 after 65 days. SLP was consulted for possible PMSV placement.   Assessment / Plan / Recommendation Clinical Impression  Pt was seen for PMV evaluation. He was alert and cooperative throughout the evaluation but stated that he was tired. Pt was educated regarding the anatomy of the larynx, the impact of the trach on voicing, and the goals of the session. Pt verbalized understanding. Cuff  was inflated upon SLP's arrival and pt tolerated cuff deflation well. Pt exhibited coughing upon  deflation and was able to able to expectorate secretions and mucous through the trach. He presented with vitals of HR 82, SpO2 100, and RR 17 upon cuff deflation. Pt tolerated finger occlusion without evidence of air trapping. He was able to count to five with finger occlusion and exhibited coughing thereafter with expulsion of secretions from the trach. He tolerated PMSV for 25 minutes with vitals WFL ranging HR 84-89, SpO2 100, and RR 15-21. Pt was able to expectorate secretions orally following PMSV placement. Vocal quality was low and breathy throughout the evaluation. He exhibited difficulty coordinating respiration with speech but was responsive to cueing to increase breath support and was able to progressively increase vocal intensity with cues. Pt attempted to sing with music from one of his favorite bands, Debroah Baller, but he was unable to demonstrate adequate breath support for this task. PMSV was removed at the end of session and cuff was re-inflated. PMSV may be intermittently placed for brief periods to facilitate communication if full staff supervision is provided. SLP will continue to follow pt. SLP Visit Diagnosis: Aphonia (R49.1)    SLP Assessment  Patient needs continued Speech Lanaguage Pathology Services    Follow Up Recommendations  LTACH    Frequency and Duration min 2x/week  2 weeks    PMSV Trial PMSV was placed for: 25 Able to redirect subglottic air through upper airway: Yes Able to Attain Phonation: Yes Voice Quality: Breathy;Hoarse;Low vocal intensity Able to Expectorate Secretions: Yes Level of Secretion Expectoration with PMSV: Oral;Tracheal Breath Support for Phonation: Moderately decreased Intelligibility: Intelligibility reduced Word: 75-100% accurate Phrase: 50-74% accurate Sentence: 50-74% accurate Conversation: 25-49% accurate Respirations During Trial: (15-21) SpO2 During Trial: (100) Pulse During Trial: (HR 84-89) Behavior: Alert;Cooperative;Expresses  self well;Good eye contact;Responsive to questions   Tracheostomy Tube       Vent Dependency       Cuff Deflation Trial     Annell Canty I. Hardin Negus, MS, [place] Office number [phone] Pager [phone]  Tolerated Cuff Deflation: Yes Length of Time for Cuff Deflation Trial: 25 Behavior: Alert;Responsive to questions Cuff Deflation Trial - Comments: See impressions        Horton Marshall 10/16/2019, 3:12 PM

## 2019-10-16 NOTE — Progress Notes (Signed)
SLP Cancellation Note  Patient Details Name: [name] MRN: [id number] DOB: [birth year]   Cancelled treatment:       Reason Eval/Treat Not Completed: Patient not medically ready(Pt has been placed back on vent. SLP will follow up. )  Zohal Reny I. Hardin Negus, [place], [place] Office number [phone] Pager [place] 10/16/2019, 8:21 AM

## 2019-10-16 NOTE — Progress Notes (Signed)
Patient ID: [name], male   DOB: Dec 22, 1975, 44 y.o.   MRN: [id number]    Advanced Heart Failure Rounding Note   Subjective:    Awake on vent through trach. Communicative.   On trach collar for several hours yesterday. Rested on vent overnight.   Remains afebrile .  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 Working with PT.   Lasix gtt on hold due to contraction alkalosis. Weight up 3 pounds.    Objective:   Weight Range:  Vital Signs:   Temp:  [98.3 F (36.8 C)-99.3 F (37.4 C)] 99.3 F (37.4 C) (03/09 2349) Pulse Rate:  [75-89] 81 (03/10 0700) Resp:  [18-23] 19 (03/10 0700) BP: (97-132)/(49-100) 113/55 (03/10 0700) SpO2:  [97 %-100 %] 100 % (03/10 0700) FiO2 (%):  [35 %-40 %] 40 % (03/10 0400) Weight:  [127.4 kg] 127.4 kg (03/10 0400) Last BM Date: 10/15/19  Weight change: Filed Weights   10/14/19 0400 10/15/19 0500 10/16/19 0400  Weight: 126.4 kg 125.8 kg 127.4 kg    Intake/Output:   Intake/Output Summary (Last 24 hours) at 10/16/2019 0813 Last data filed at 10/16/2019 0634 Gross per 24 hour  Intake 805.71 ml  Output 2300 ml  Net -1494.29 ml     Physical Exam : General:  In bed on vent through trach follows command HEENT: normal Neck: supple. no JVD. + trach  RIJ site ok Carotids 2+ bilat; no bruits. No lymphadenopathy or thryomegaly appreciated. Cor: PMI nondisplaced. Regular rate & rhythm. No rubs, gallops or murmurs. Lungs: clear Abdomen: obese soft, nontender, nondistended. No hepatosplenomegaly. No bruits or masses. Good bowel sounds. + PEG Extremities: no cyanosis, clubbing, rash, 3+ edema Neuro: awake on vent following commands and talking. Weak in all 4 extremities   Telemetry: Sinus 80-90s.  Personally reviewed   Labs: Basic Metabolic Panel: Recent Labs  Lab 10/12/19 0431 10/12/19 0431 10/13/19 0351 10/13/19 0351 10/14/19 0349 10/14/19 0349 10/14/19 1615 10/15/19 0429 10/16/19 0415  NA 141   < > 143  --  142  --  139 142  141  K 3.9   < > 3.3*  --  3.1*  --  3.5 3.9 3.9  CL 99   < > 94*  --  87*  --  89* 91* 92*  CO2 35*   < > 39*  --  44*  --  41* 41* 38*  GLUCOSE 140*   < > 114*  --  95  --  158* 109* 115*  BUN 55*   < > 55*  --  33*  --  36* 39* 45*  CREATININE 0.68   < > 0.64  --  0.59*  --  0.63 0.61 0.54*  CALCIUM 8.3*   < > 8.4*   < > 8.6*   < > 8.3* 8.5* 8.7*  MG 2.0  --  1.7  --  1.9  --   --  2.0 2.1  PHOS 4.1  --  3.2  --  3.7  --   --  3.3 3.0   < > = values in this interval not displayed.    Liver Function Tests: Recent Labs  Lab 10/10/19 0328  ALBUMIN 1.8*   No results for input(s): LIPASE, AMYLASE in the last 168 hours. No results for input(s): AMMONIA in the last 168 hours.  CBC: Recent Labs  Lab 10/12/19 0431 10/13/19 0351 10/14/19 0349 10/15/19 0429 10/16/19 0415  WBC 6.0 8.6 9.0 6.7 5.5  HGB 7.8* 8.4*  9.5* 8.6* 8.8*  HCT 27.1* 29.0* 32.6* 29.8* 30.6*  MCV 97.8 95.7 95.6 96.1 96.5  PLT 124* 184 233 208 231    Cardiac Enzymes: No results for input(s): CKTOTAL, CKMB, CKMBINDEX, TROPONINI in the last 168 hours.  BNP: BNP (last 3 results) Recent Labs    07/22/19 1039  BNP 15.3    ProBNP (last 3 results) No results for input(s): PROBNP in the last 8760 hours.    Other results:  Imaging: IR Replc Gastro/Colonic Tube Percut W/Fluoro  Result Date: 10/14/2019 INDICATION: 44 year old male with a history of prior surgically placed gastric tube at the bedside, has become withdrawn EXAM: GASTROSTOMY CATHETER REPLACEMENT MEDICATIONS: None ANESTHESIA/SEDATION: Versed 0 mg IV; Fentanyl 50 mcg IV Moderate Sedation Time:  None The patient was continuously monitored during the procedure by the interventional radiology nurse under my direct supervision. CONTRAST:  41m OMNIPAQUE IOHEXOL 300 MG/ML SOLN - administered into the gastric lumen. FLUOROSCOPY TIME:  Fluoroscopy Time: 4 minutes 30 seconds (175 mGy). COMPLICATIONS: None PROCEDURE: Informed written consent was obtained from  the patient and the patient's family after a thorough discussion of the procedural risks, benefits and alternatives. All questions were addressed. Maximal Sterile Barrier Technique was utilized including caps, mask, sterile gowns, sterile gloves, sterile drape, hand hygiene and skin antiseptic. A timeout was performed prior to the initiation of the procedure. The epigastrium was prepped with Betadine in a sterile fashion, and a sterile drape was applied covering the operative field. A sterile gown and sterile gloves were used for the procedure. Initial contrast injection within adapter was unsuccessful in opacifying any of the gastric elements. The ostomy site was probed with a 5 [place] the catheter, Glidewire, without success. Ultimately, adapter was pressurized at the ostomy site with hand injection of contrast, opacifying a thin channel into the stomach. Ultimately the Kumpe the catheter and the Glidewire were navigated through this small channel into the stomach lumen. Once the catheter was within the lumen of the stomach, contrast confirmed location. A stiff Amplatz wire was placed. Serial dilation of the tract a 192[place] performed. Modified Seldinger technique was then used to place a low-profile 14 French balloon retention into it gastric tube. 7 cc of saline was used to inflate the balloon. Patient tolerated the procedure well and remained hemodynamically stable throughout. No complications were encountered and no significant blood loss encountered. IMPRESSION: Status post rescue of a displaced percutaneous gastrostomy, with placement of a 131French balloon retention gastric tube. Signed, JDulcy Fanny WEarleen Newport DO [facility] Electronically Signed   By: JCorrie MckusickD.O.   On: 10/14/2019 12:59     Medications:     Scheduled Medications: . acetaminophen (TYLENOL) oral liquid 160 mg/5 mL  650 mg Per Tube Q6H  . amLODipine  5 mg Per Tube  Daily  . vitamin C  500 mg Per Tube Daily  . chlorhexidine gluconate (MEDLINE KIT)  15 mL Mouth Rinse BID  . Chlorhexidine Gluconate Cloth  6 each Topical Daily  . cloNIDine  0.2 mg Per Tube BID  . diatrizoate meglumine-sodium  30 mL Oral Once  . enoxaparin (LOVENOX) injection  60 mg Subcutaneous Q24H  . feeding supplement (OSMOLITE 1.5 CAL)  237 mL Per Tube 6 X Daily  . feeding supplement (PRO-STAT SUGAR FREE 64)  60 mL Per Tube QID  . free water  300 mL Per Tube Q6H  . furosemide  40 mg Intravenous Once  . Gerhardt's butt  cream   Topical QID  . insulin aspart  0-20 Units Subcutaneous Q4H  . insulin aspart  5 Units Subcutaneous Q4H  . insulin detemir  12 Units Subcutaneous BID  . ipratropium-albuterol  3 mL Nebulization TID  . magic mouthwash  5 mL Oral TID  . mouth rinse  15 mL Mouth Rinse 10 times per day  . montelukast  10 mg Per Tube QHS  . nutrition supplement (JUVEN)  1 packet Per Tube BID BM  . pantoprazole (PROTONIX) IV  40 mg Intravenous BID  . potassium chloride  20 mEq Oral Once  . propranolol  20 mg Per Tube TID  . psyllium  1 packet Per Tube BID  . QUEtiapine  50 mg Per Tube QHS  . sodium chloride flush  10-40 mL Intracatheter Q12H  . testosterone  5 g Transdermal Daily  . vancomycin variable dose per unstable renal function (pharmacist dosing)   Does not apply See admin instructions    Infusions: . sodium chloride    . sodium chloride 250 mL (10/08/19 1626)  . sodium chloride Stopped (10/13/19 0548)  . dexmedetomidine (PRECEDEX) IV infusion 0.3 mcg/kg/hr (10/16/19 0400)  . meropenem (MERREM) IV Stopped (10/16/19 0025)  . vancomycin      PRN Medications: Place/Maintain arterial line **AND** sodium chloride, sodium chloride, sodium chloride, acetaminophen (TYLENOL) oral liquid 160 mg/5 mL, acetaminophen, albuterol, [COMPLETED] diazepam **FOLLOWED BY** diazepam, hydrALAZINE, HYDROmorphone, magnesium hydroxide, midazolam, ondansetron (ZOFRAN) IV, senna-docusate,  sodium chloride, sodium chloride flush   Assessment/Plan:    1. Acute hypoxic/hypercarbic respiratory failure due to COVID PNA: Remained markedly hypoxic despite full support. Intubated 12/26. S/p bilateral CTs 12/26 for pneumothoraces.  TEE on 12/26 LVEF 70% RV ok. VV ECMO begun 12/26. Tracheostomy 12/29.  COVID ARDS at this point. ECMO circuit changed 1/8 & 1/23. Crescent cannula placed 2/2 (femoral cannuals removed) - Decannulated 3/4 - Doing well with TC trials.  Continue to wean. Will get PMV today - CXR persists with diffuse infiltrates. Restart lasix. Will give lasix 40IV today - Completed 14 days  of meropenum on 2/28. Restarted 3/5. Wll stop after 10 days - Continue DVT dose heparin  2. COVID PNA: management of COVID infection per CCM. CXR with bilateral multifocal PNA progressing to likely ARDS.  - s/p 10 days of remdesivir - 12/16, 12/2 s/p tociluzimab - 12/17 s/p convalescent plasma - Decadron at 56m/daily completed 1/11  3. ID: Empiric coverage with vancomycin/cefepime initially for possible secondary bacterial PNA, course completed.   - treated with cefepime and vanc for + cx with staph epidimidis, MSSA and serratia (sensitivities ok). - developed recurrent PNA and sepsis 2/15 - BAL with > 100K serratia. BAL growing > 100K serratia. Completed 14 days mero on 2/28 - Repeat bronch 3/3 looked good  -Continue antibiotics as above for possible abdominal wall abscess at PEG site. - -Vanc stop tomorrow. Mero stop 3/14  4. Anemia: - hgb 8.8. Follow  5. FEN - G-tube replaced by IR. Site improved  5. Hypernatremia -Resolved.  6. AKI - Resolved.   7. DTIs - On both feet.  These are improving  8.  Hypokalemia. -K 3.9 stable  9.  Diffuse muscle weakness. -This is consistent with ICU/steroid/paralytics myopathy.  Continue passive range of motion exercises with PT.  He is improving slowly. No change  10. HTN - BP now dropping back. Stop hydralazine     Length of  Stay: 89   Kiela Shisler MD 10/16/2019, 8:13 AM  Advanced Heart  Failure Team Pager [phone] (M-F; 7a - 4p)  Please contact [facility] for night-coverage after hours (4p -7a ) and weekends on [email or web address]

## 2019-10-16 NOTE — Progress Notes (Signed)
Physical Therapy Treatment Patient Details Name: [name] MRN: [id number] DOB: 09-Aug-1975 Today's Date: 10/16/2019    History of Present Illness 44 y/o M admitted 12/14 with COVID pneumonia causing acute hypoxemic respiratory failure.   Developed pneumomediastinum 12/23 with concern for possible bilateral small pneumothoraces on 12/24. Pt intubated on 12/26 and started on ECMO on 12/27. Tracheostomy on 12/29 with bleeding from trach an dother sites on 12/31. Bronch on 1/11 and 1/15. Slowly weaning sedation 2/5. Continuing to wean sedation and starting sweep trials to wean ECMO - Decannulated 3/4    PT Comments    Pt tolerated treatment well despite pain in BLE. Pt sits at edge of bed with PT/OT back support, requiring maxA to maintain sitting balance. Pt with significant UE weakness, R side weaker than left, as well as profound LE weakness at this time. Pt will continue to benefit from aggressive mobilization to improve activity tolerance and strength, in the hopes of improving mobility quality and reducing caregiver burden.   Follow Up Recommendations  LTACH     Equipment Recommendations  (defer to post-acute setting)    Recommendations for Other Services       Precautions / Restrictions Precautions Precautions: Fall;Other (comment) Precaution Comments: trach collar Required Braces or Orthoses: Other Brace Other Brace: Bil Prevalon boots Restrictions Weight Bearing Restrictions: No    Mobility  Bed Mobility Overal bed mobility: Needs Assistance Bed Mobility: Rolling;Supine to Sit;Sit to Supine Rolling: Total assist   Supine to sit: Max assist;+2 for physical assistance;+2 for safety/equipment Sit to supine: Max assist;+2 for physical assistance;+2 for safety/equipment   General bed mobility comments: total to roll for placement of sling  Transfers Overall transfer level: Needs assistance                  Ambulation/Gait                 Stairs             Wheelchair Mobility    Modified Rankin (Stroke Patients Only)       Balance Overall balance assessment: Needs assistance Sitting-balance support: Bilateral upper extremity supported;Feet supported Sitting balance-Leahy Scale: Poor Sitting balance - Comments: maxA with BUE support of bed Postural control: Posterior lean;Left lateral lean(left lean due to bed)                                  Cognition Arousal/Alertness: Awake/alert Behavior During Therapy: WFL for tasks assessed/performed Overall Cognitive Status: Difficult to assess                                 General Comments: pt follows commands consistentyl, nods appropriately, not able to verbalize at this time      Exercises Other Exercises Other Exercises: Shoulder shrugs x 5 Other Exercises: Elbow flexion AAROM bilaterally x5 Other Exercises: elbow extension AROM bilaterally x5 Other Exercises: shoulder flexion AAROM bilaterally x 5    General Comments General comments (skin integrity, edema, etc.): VSS on trach collar 10 LPM 40% FiO2      Pertinent Vitals/Pain Pain Assessment: Faces Faces Pain Scale: Hurts whole lot Pain Location: bilateral knees Pain Descriptors / Indicators: Grimacing Pain Intervention(s): Limited activity within patient's tolerance    Home Living  Prior Function            PT Goals (current goals can now be found in the care plan section) Acute Rehab PT Goals Patient Stated Goal: per wife for pt to get stronger PT Goal Formulation: With patient/family Time For Goal Achievement: 10/30/19 Potential to Achieve Goals: Fair Progress towards PT goals: Progressing toward goals    Frequency    Min 3X/week(more if able)      PT Plan Current plan remains appropriate    Co-evaluation PT/OT/SLP Co-Evaluation/Treatment: Yes Reason for Co-Treatment: Complexity of the patient's impairments (multi-system  involvement);Necessary to address cognition/behavior during functional activity;For patient/therapist safety;To address functional/ADL transfers PT goals addressed during session: Mobility/safety with mobility;Balance;Strengthening/ROM        AM-PAC PT "6 Clicks" Mobility   Outcome Measure  Help needed turning from your back to your side while in a flat bed without using bedrails?: Total Help needed moving from lying on your back to sitting on the side of a flat bed without using bedrails?: Total Help needed moving to and from a bed to a chair (including a wheelchair)?: Total Help needed standing up from a chair using your arms (e.g., wheelchair or bedside chair)?: Total Help needed to walk in hospital room?: Total Help needed climbing 3-5 steps with a railing? : Total 6 Click Score: 6    End of Session Equipment Utilized During Treatment: Oxygen Activity Tolerance: Patient tolerated treatment well Patient left: in chair;with call bell/phone within reach Nurse Communication: Mobility status;Need for lift equipment PT Visit Diagnosis: Muscle weakness (generalized) (M62.81);Other symptoms and signs involving the nervous system (R29.898);Other abnormalities of gait and mobility (R26.89)     Time: 9643-8381 PT Time Calculation (min) (ACUTE ONLY): 28 min  Charges:  $Therapeutic Activity: 8-22 mins                     Zenaida Niece, PT, DPT Acute Rehabilitation Pager: [phone]    Zenaida Niece 10/16/2019, 2:11 PM

## 2019-10-16 NOTE — Progress Notes (Signed)
NAME:  [name], MRN:  [id number], DOB:  11/13/1975, LOS: 55 ADMISSION DATE:  07/22/2019, CONSULTATION DATE:  12/24 REFERRING MD:  Sloan Leiter, CHIEF COMPLAINT:  Dyspnea   Brief History   44 y/o male admitted 12/14 with COVID pneumonia leading to ARDS, pneumomediastinum.  Intubated 12/26, bilateral chest tubes placed.  Past Medical History  GERD HTN Asthma  Significant Hospital Events   12/14 admit with hypoxemic respiratory failure in setting of COVID-19 pneumonia 12/23 noted to have pneumomediastinum on chest x-ray 12/24PCCM consulted for evaluation 12/26 worsening hypoxemia, intubated 12/27 VV fem/fem ECMO cannulation 12/29 tracheostomy >> See bedside nursing event notes for full rundown of procedures after 12/27 1/5 epistaxis, nasal packing 1/30 improved cxr, improved lung compliance, TV 350s on 15PC  1/31 CXR increased infiltrates, +CFB     2/2 Converted to right IJ Crescent cannula. 2/3 PEG tube inserted at bedside 2/14 30 min sweep trial> paO2 105 (18 PEEP, 60% FiO2) pH 7.22, pCO2 87 2/20 2 hours sweep trial 2/21 1.5 hour sweep trial 2/24 7-hour sweep trial.  Increasingly awake. 3/2 - decannulation attempt aborted due to increased hypercapnia when sedated for procedure  Consults:  PCCM, CTS, CHF, Palliative  Procedures:  PICC 1/3 >> R radial a-line 1/20 >> R IJ ECMO cath 2/3 >> Tracheostomy 12/29 >> 10/14/2019 PEG tube replacement>>  Significant Diagnostic Tests:  CT chest 12/22>>extensive pneumomediastinum, multifocal patchy bilateral groundglass opacities CXR 1/25 >> improving bilateral infiltrates. ECMO cannulae in place. CXR 2/5 >> clearing bilateral infiltrates  Micro Data:  BCx2 12/14 >>negative  Sputum 12/15 >> OPF 12/28 blood > negative 12/29 > staph aureus 1/4 bronch:  1/2 acid fast smear bronch: negative 1/2 fungal cx bronch: negative1/12 BAL: serratia, staph epidermidisu 1/4 BAL > MSSA, Serratia 1/9 blood > negative 1/9 Resp culture >  MSSA, serratia 1/11 serratia > negative 1/12 fungus culture > negative 1/12 BAL > serratia 1/18 BAL >> rare Serratia. 1/24 blood > negative 1/30 blood > negative 1/30 resp culture > serratia 2/1 BAL >> rare Serratia - enterococcus 2/1 aspergillus Ag > negative 2/2 resp culture > serratia  2/2 blood > negative 2/5 urine > negative 2/9 resp: serratia 2/15 BAL serratia  Antimicrobials:  Received remdesivir, steroids, actemra and convalescent plasma  5 days cefepime 12/24-12/29 Cefepime 1/4->1/13 vanc 1/4> 1/11 Ceftriaxone 1/14 > 1/27 vanc x1 1/20 Cefazolin 1/29 > 2/2 cefuroxim 2/2 vanc 2/2 Ceftriaxone x1 2/4 Meropenem 2/15>>2/21 Tobramycin inhaled 2/15>>2/21  Interim history/subjective:   Tolerated trach collar trial again yesterday evening and almost 5 hours.  Also able to get up in chair yesterday for the first time  Objective   Blood pressure 135/73, pulse 94, temperature 99.3 F (37.4 C), temperature source Oral, resp. rate (!) 24, height _0  (1.727 m), weight 127.4 kg, SpO2 98 %.    Vent Mode: PCV FiO2 (%):  [35 %-40 %] 40 % Set Rate:  [20 bmp] 20 bmp PEEP:  [8 cmH20] 8 cmH20 Plateau Pressure:  [23 cmH20-26 cmH20] 23 cmH20   Intake/Output Summary (Last 24 hours) at 10/16/2019 0925 Last data filed at 10/16/2019 6644 Gross per 24 hour  Intake 784.5 ml  Output 2300 ml  Net -1515.5 ml   Filed Weights   10/14/19 0400 10/15/19 0500 10/16/19 0400  Weight: 126.4 kg 125.8 kg 127.4 kg    Examination: General appearance: 44 y.o., male, alert oriented Eyes: anicteric sclerae, moist conjunctivae; no lid-lag; PERRLA, tracking appropriately HENT: NCAT; oropharynx, MMM Neck: Trachea midline; FROM, bandage over cannulation site,  tracheostomy tube in place Lungs: CTAB, no crackles, no wheeze, with normal respiratory effort and no intercostal retractions CV: RRR, S1, S2 Abdomen: Soft, non-tender; non-distended, BS present  Extremities: No peripheral edema Skin: No  significant rash, deep tissue injury of the lateral feet, some sloughing started Psych: Appropriate affect Neuro: Alert and oriented to person and place, still very weak.  Working with PT.     ABG    Component Value Date/Time   PHART 7.384 10/10/2019 1817   PCO2ART 63.7 (H) 10/10/2019 1817   PO2ART 76.0 (L) 10/10/2019 1817   HCO3 38.0 (H) 10/10/2019 1817   TCO2 40 (H) 10/10/2019 1817   ACIDBASEDEF 4.0 (H) 09/16/2019 2334   O2SAT 77.8 10/16/2019 [facility] Problem list   Pneumothoraces-resolved.  Assessment & Plan:   ARDS due to COVID 19, requiring mechanical ventilation.  Decannulated from VV ECMO on 3/4 after 65 days. SBT today Switch over to trach collar trials again to see how long he will go. Work with speech therapy for possible PMV placement Okay with deflating cuff to see if he can speak with PMV.  Fever-unclear if this is cytokine response to's decannulation versus unidentified source of infection.  Stable SIRS from decannulation Dislodged PEG tube Indurated area of the skin right lateral PEG tube. Replaced by interventional radiology  AKI with azotemia - improving  Positive cumulative fluid balance Hypernatremia has corrected Severe third spacing -Additional doses of diuresis today -Follow electrolytes and urine output  Hypertension Starting to decrease some of his oral antihypertensive. Discussed with cardiology  Serratia pneumonia> treated.   Neuromuscular weakness Continue aggressive PT OT Out of bed to chair today  Bilateral lower feet deep tissue injury Wound care  Anemia due to critical illnes Recent Labs    10/15/19 0429 10/16/19 0415  HGB 8.6* 8.8*   Transfusion per protocol, conservative transfusion threshold  Best practice:  Pain/Anxiety/Delirium protocol (if indicated): PRNs only VAP protocol (if indicated): Bundle in place. Respiratory support goals: Pressure support CPAP trials. DVT prophylaxis: Lovenox GI  prophylaxis: PPI Nutrition: TF Fluid status goals:  See above Urinary catheter: Guide hemodynamic management Glucose control: controlled with current regimen Mobility/therapy needs: Bedrest.  Daily labs: As needed Code Status: Full code  Disposition: ICU Family: 10/15/2019 patient updated at bedside  Labs   CBC: CBC Latest Ref Rng & Units 10/16/2019 10/15/2019 10/14/2019  WBC 4.0 - 10.5 K/uL 5.5 6.7 9.0  Hemoglobin 13.0 - 17.0 g/dL 8.8(L) 8.6(L) 9.5(L)  Hematocrit 39.0 - 52.0 % 30.6(L) 29.8(L) 32.6(L)  Platelets 150 - 400 K/uL 231 208 233     Basic Metabolic Panel: BMP Latest Ref Rng & Units 10/16/2019 10/15/2019 10/14/2019  Glucose 70 - 99 mg/dL 115(H) 109(H) 158(H)  BUN 6 - 20 mg/dL 45(H) 39(H) 36(H)  Creatinine 0.61 - 1.24 mg/dL 0.54(L) 0.61 0.63  Sodium 135 - 145 mmol/L 141 142 139  Potassium 3.5 - 5.1 mmol/L 3.9 3.9 3.5  Chloride 98 - 111 mmol/L 92(L) 91(L) 89(L)  CO2 22 - 32 mmol/L 38(H) 41(H) 41(H)  Calcium 8.9 - 10.3 mg/dL 8.7(L) 8.5(L) 8.3(L)     GFR: Estimated Creatinine Clearance: 151.7 mL/min (A) (by C-G formula based on SCr of 0.54 mg/dL (L)). Recent Labs  Lab 10/10/19 0328 10/10/19 1808 10/11/19 0442 10/11/19 0450 10/12/19 0431 10/12/19 0431 10/13/19 0351 10/14/19 0349 10/15/19 0429 10/16/19 0415  WBC 6.2   < >   < >  --  6.0   < > 8.6 9.0 6.7  5.5  LATICACIDVEN 0.9  --   --  1.2 1.2  --   --   --   --   --    < > = values in this interval not displayed.    Liver Function Tests: Recent Labs  Lab 10/10/19 0328  ALBUMIN 1.8*   No results for input(s): LIPASE, AMYLASE in the last 168 hours. No results for input(s): AMMONIA in the last 168 hours.  ABG    Component Value Date/Time   PHART 7.384 10/10/2019 1817   PCO2ART 63.7 (H) 10/10/2019 1817   PO2ART 76.0 (L) 10/10/2019 1817   HCO3 38.0 (H) 10/10/2019 1817   TCO2 40 (H) 10/10/2019 1817   ACIDBASEDEF 4.0 (H) 09/16/2019 2334   O2SAT 77.8 10/16/2019 0404     Coagulation Profile: No results for  input(s): INR, PROTIME in the last 168 hours.  Cardiac Enzymes: No results for input(s): CKTOTAL, CKMB, CKMBINDEX, TROPONINI in the last 168 hours.  HbA1C: Hgb A1c MFr Bld  Date/Time Value Ref Range Status  09/23/2019 08:58 AM 5.5 4.8 - 5.6 % Final    Comment:    (NOTE) Pre diabetes:          5.7%-6.4% Diabetes:              >6.4% Glycemic control for   <7.0% adults with diabetes   07/24/2019 04:50 AM 6.7 (H) 4.8 - 5.6 % Final    Comment:    (NOTE) Pre diabetes:          5.7%-6.4% Diabetes:              >6.4% Glycemic control for   <7.0% adults with diabetes     CBG: Recent Labs  Lab 10/15/19 1610 10/15/19 1929 10/15/19 2347 10/16/19 0324 10/16/19 0846  GLUCAP 106* 116* 85 81 87    This patient is critically ill with multiple organ system failure; which, requires frequent high complexity decision making, assessment, support, evaluation, and titration of therapies. This was completed through the application of advanced monitoring technologies and extensive interpretation of multiple databases. During this encounter critical care time was devoted to patient care services described in this note for 32 minutes.  Garner Nash, DO [facility] 10/16/2019 9:25 AM

## 2019-10-16 NOTE — Plan of Care (Signed)
  Problem: Clinical Measurements: Goal: Will remain free from infection Outcome: Progressing   Problem: Clinical Measurements: Goal: Respiratory complications will improve Outcome: Progressing   Problem: Clinical Measurements: Goal: Cardiovascular complication will be avoided Outcome: Progressing

## 2019-10-17 LAB — BASIC METABOLIC PANEL
Anion gap: 9 (ref 5–15)
BUN: 36 mg/dL — ABNORMAL HIGH (ref 6–20)
CO2: 38 mmol/L — ABNORMAL HIGH (ref 22–32)
Calcium: 8.6 mg/dL — ABNORMAL LOW (ref 8.9–10.3)
Chloride: 94 mmol/L — ABNORMAL LOW (ref 98–111)
Creatinine, Ser: 0.49 mg/dL — ABNORMAL LOW (ref 0.61–1.24)
GFR calc Af Amer: >60 mL/min (ref >60)
GFR calc non Af Amer: >60 mL/min (ref >60)
Glucose, Bld: 97 mg/dL (ref 70–99)
Potassium: 3.9 mmol/L (ref 3.5–5.1)
Sodium: 141 mmol/L (ref 135–145)

## 2019-10-17 LAB — CBC
HCT: 31.4 % — ABNORMAL LOW (ref 39.0–52.0)
Hemoglobin: 8.9 g/dL — ABNORMAL LOW (ref 13.0–17.0)
MCH: 27.6 pg (ref 26.0–34.0)
MCHC: 28.3 g/dL — ABNORMAL LOW (ref 30.0–36.0)
MCV: 97.2 fL (ref 80.0–100.0)
Platelets: 227 10*3/uL (ref 150–400)
RBC: 3.23 MIL/uL — ABNORMAL LOW (ref 4.22–5.81)
RDW: 15.9 % — ABNORMAL HIGH (ref 11.5–15.5)
WBC: 5.1 10*3/uL (ref 4.0–10.5)
nRBC: 0 % (ref 0.0–0.2)

## 2019-10-17 LAB — GLUCOSE, CAPILLARY
Glucose-Capillary: 84 mg/dL (ref 70–99)
Glucose-Capillary: 89 mg/dL (ref 70–99)
Glucose-Capillary: 124 mg/dL — ABNORMAL HIGH (ref 70–99)
Glucose-Capillary: 69 mg/dL — ABNORMAL LOW (ref 70–99)
Glucose-Capillary: 107 mg/dL — ABNORMAL HIGH (ref 70–99)
Glucose-Capillary: 95 mg/dL (ref 70–99)

## 2019-10-17 LAB — MAGNESIUM: Magnesium: 2 mg/dL (ref 1.7–2.4)

## 2019-10-17 LAB — COOXEMETRY PANEL
Carboxyhemoglobin: 2.7 % — ABNORMAL HIGH (ref 0.5–1.5)
Methemoglobin: 0.6 % (ref 0.0–1.5)
O2 Saturation: 83.6 %
Total hemoglobin: 8.7 g/dL — ABNORMAL LOW (ref 12.0–16.0)

## 2019-10-17 LAB — PHOSPHORUS: Phosphorus: 2.7 mg/dL (ref 2.5–4.6)

## 2019-10-17 MED ORDER — CLONIDINE HCL 0.1 MG PO TABS
0.1000 mg | ORAL_TABLET | Freq: Two times a day (BID) | ORAL | Status: DC
  Administered 2019-10-17 – 2019-10-21 (×10): 0.1 mg
  Filled 2019-10-17 (×10): qty 1

## 2019-10-17 MED ORDER — INSULIN DETEMIR 100 UNIT/ML ~~LOC~~ SOLN
10.0000 [IU] | Freq: Two times a day (BID) | SUBCUTANEOUS | Status: DC
  Administered 2019-10-17 – 2019-10-19 (×6): 10 [IU] via SUBCUTANEOUS
  Filled 2019-10-17 (×8): qty 0.1

## 2019-10-17 MED ORDER — QUETIAPINE FUMARATE 25 MG PO TABS
25.0000 mg | ORAL_TABLET | Freq: Every day | ORAL | Status: DC
  Administered 2019-10-17 – 2019-10-21 (×5): 25 mg
  Filled 2019-10-17 (×5): qty 1

## 2019-10-17 NOTE — Progress Notes (Signed)
Pt placed back on full support vent settings to rest for the night.

## 2019-10-17 NOTE — Progress Notes (Signed)
NAME:  [name], MRN:  [id number], DOB:  05/18/1976, LOS: 66 ADMISSION DATE:  07/22/2019, CONSULTATION DATE:  12/24 REFERRING MD:  Sloan Leiter, CHIEF COMPLAINT:  Dyspnea   Brief History   44 y/o male admitted 12/14 with COVID pneumonia leading to ARDS, pneumomediastinum.  Intubated 12/26, bilateral chest tubes placed.  Past Medical History  GERD HTN Asthma  Significant Hospital Events   12/14 admit with hypoxemic respiratory failure in setting of COVID-19 pneumonia 12/23 noted to have pneumomediastinum on chest x-ray 12/24PCCM consulted for evaluation 12/26 worsening hypoxemia, intubated 12/27 VV fem/fem ECMO cannulation 12/29 tracheostomy >> See bedside nursing event notes for full rundown of procedures after 12/27 1/5 epistaxis, nasal packing 1/30 improved cxr, improved lung compliance, TV 350s on 15PC  1/31 CXR increased infiltrates, +CFB     2/2 Converted to right IJ Crescent cannula. 2/3 PEG tube inserted at bedside 2/14 30 min sweep trial> paO2 105 (18 PEEP, 60% FiO2) pH 7.22, pCO2 87 2/20 2 hours sweep trial 2/21 1.5 hour sweep trial 2/24 7-hour sweep trial.  Increasingly awake. 3/2 - decannulation attempt aborted due to increased hypercapnia when sedated for procedure  Consults:  PCCM, CTS, CHF, Palliative  Procedures:  PICC 1/3 >> R radial a-line 1/20 >> R IJ ECMO cath 2/3 >> Tracheostomy 12/29 >> 10/14/2019 PEG tube replacement>>  Significant Diagnostic Tests:  CT chest 12/22>>extensive pneumomediastinum, multifocal patchy bilateral groundglass opacities CXR 1/25 >> improving bilateral infiltrates. ECMO cannulae in place. CXR 2/5 >> clearing bilateral infiltrates  Micro Data:  BCx2 12/14 >>negative  Sputum 12/15 >> OPF 12/28 blood > negative 12/29 > staph aureus 1/4 bronch:  1/2 acid fast smear bronch: negative 1/2 fungal cx bronch: negative1/12 BAL: serratia, staph epidermidisu 1/4 BAL > MSSA, Serratia 1/9 blood > negative 1/9 Resp culture >  MSSA, serratia 1/11 serratia > negative 1/12 fungus culture > negative 1/12 BAL > serratia 1/18 BAL >> rare Serratia. 1/24 blood > negative 1/30 blood > negative 1/30 resp culture > serratia 2/1 BAL >> rare Serratia - enterococcus 2/1 aspergillus Ag > negative 2/2 resp culture > serratia  2/2 blood > negative 2/5 urine > negative 2/9 resp: serratia 2/15 BAL serratia  Antimicrobials:  Received remdesivir, steroids, actemra and convalescent plasma  5 days cefepime 12/24-12/29 Cefepime 1/4->1/13 vanc 1/4> 1/11 Ceftriaxone 1/14 > 1/27 vanc x1 1/20 Cefazolin 1/29 > 2/2 cefuroxim 2/2 vanc 2/2 Ceftriaxone x1 2/4 Meropenem 2/15>>2/21 Tobramycin inhaled 2/15>>2/21  Interim history/subjective:   Tolerated trach collar yesterday for greater than 6 hours. Plans for repeat today.   Objective   Blood pressure (!) 111/46, pulse 88, temperature 98.7 F (37.1 C), temperature source Oral, resp. rate 18, height _0  (1.727 m), weight 127.8 kg, SpO2 100 %.    Vent Mode: PCV FiO2 (%):  [40 %] 40 % Set Rate:  [20 bmp] 20 bmp PEEP:  [8 cmH20] 8 cmH20 Plateau Pressure:  [20 cmH20-22 cmH20] 20 cmH20   Intake/Output Summary (Last 24 hours) at 10/17/2019 1056 Last data filed at 10/17/2019 1000 Gross per 24 hour  Intake 2030.35 ml  Output 2250 ml  Net -219.65 ml   Filed Weights   10/15/19 0500 10/16/19 0400 10/17/19 0500  Weight: 125.8 kg 127.4 kg 127.8 kg    Examination: General appearance: 44 y.o., male, NAD, conversant, attempts communicate around trach Eyes: anicteric sclerae, moist conjunctivae; no lid-lag; PERRLA, tracking appropriately HENT: NCAT; oropharynx, MMM Neck: Trachea midline; tracheostomy tube in place no significant secretions Lungs: CTAB, no crackles, no  wheeze CV: RRR, S1, S2, no MRGs  Abdomen: Soft, non-tender; non-distended, BS present  Extremities: Trace bilateral lower extremity Skin: Normal temperature, turgor and texture; no rash Psych: Appropriate  affect Neuro: Weak bilateral upper and lower extremity motor weakness.    ABG    Component Value Date/Time   PHART 7.384 10/10/2019 1817   PCO2ART 63.7 (H) 10/10/2019 1817   PO2ART 76.0 (L) 10/10/2019 1817   HCO3 38.0 (H) 10/10/2019 1817   TCO2 40 (H) 10/10/2019 1817   ACIDBASEDEF 4.0 (H) 09/16/2019 2334   O2SAT 83.6 10/17/2019 0421     Resolved Hospital Problem list   Pneumothoraces-resolved.  Assessment & Plan:   ARDS due to COVID 19, now requiring mechanical ventilation support at night Decannulated from VV ECMO on 3/4 after 65 days. Tracheostomy tube in place -Continue trach collar trial as tolerated -Speech therapy to work with PMV  Dislodged PEG tube Indurated area of the skin right lateral PEG tube. Replaced by interventional radiology  AKI with azotemia - improving  Positive cumulative fluid balance Hypernatremia has corrected Severe third spacing -Diuresis to maintain euvolemia, follow urine output and electrolytes  Hypertension -Continue decreasing oral antihypertensives -Cut clonidine dose in half -Stop propanolol  Serratia pneumonia> treated.   Neuromuscular weakness -Continue aggressive PT OT -Out of bed to chair -Consultation to inpatient rehabilitation  Bilateral lower feet deep tissue injury Wound care continued for feet  Anemia due to critical illnes Conservative transfusion threshold  Best practice:  Pain/Anxiety/Delirium protocol (if indicated): PRNs only VAP protocol (if indicated): Bundle in place. Respiratory support goals: Trach collar trial DVT prophylaxis: Lovenox GI prophylaxis: PPI Nutrition: TF Fluid status goals:  See above Urinary catheter: Guide hemodynamic management Glucose control: controlled with current regimen Mobility/therapy needs: Bedrest.  Daily labs: As needed Code Status: Full code  Disposition: ICU Family: 10/15/2019 patient updated at bedside  Labs   CBC: CBC Latest Ref Rng & Units 10/17/2019  10/16/2019 10/15/2019  WBC 4.0 - 10.5 K/uL 5.1 5.5 6.7  Hemoglobin 13.0 - 17.0 g/dL 8.9(L) 8.8(L) 8.6(L)  Hematocrit 39.0 - 52.0 % 31.4(L) 30.6(L) 29.8(L)  Platelets 150 - 400 K/uL 227 231 208     Basic Metabolic Panel: BMP Latest Ref Rng & Units 10/17/2019 10/16/2019 10/15/2019  Glucose 70 - 99 mg/dL 97 115(H) 109(H)  BUN 6 - 20 mg/dL 36(H) 45(H) 39(H)  Creatinine 0.61 - 1.24 mg/dL 0.49(L) 0.54(L) 0.61  Sodium 135 - 145 mmol/L 141 141 142  Potassium 3.5 - 5.1 mmol/L 3.9 3.9 3.9  Chloride 98 - 111 mmol/L 94(L) 92(L) 91(L)  CO2 22 - 32 mmol/L 38(H) 38(H) 41(H)  Calcium 8.9 - 10.3 mg/dL 8.6(L) 8.7(L) 8.5(L)     GFR: Estimated Creatinine Clearance: 152.1 mL/min (A) (by C-G formula based on SCr of 0.49 mg/dL (L)). Recent Labs  Lab 10/11/19 0442 10/11/19 0450 10/12/19 0431 10/13/19 0351 10/14/19 0349 10/15/19 0429 10/16/19 0415 10/17/19 0436  WBC   < >  --  6.0   < > 9.0 6.7 5.5 5.1  LATICACIDVEN  --  1.2 1.2  --   --   --   --   --    < > = values in this interval not displayed.    Liver Function Tests: No results for input(s): AST, ALT, ALKPHOS, BILITOT, PROT, ALBUMIN in the last 168 hours. No results for input(s): LIPASE, AMYLASE in the last 168 hours. No results for input(s): AMMONIA in the last 168 hours.  ABG    Component Value Date/Time  PHART 7.384 10/10/2019 1817   PCO2ART 63.7 (H) 10/10/2019 1817   PO2ART 76.0 (L) 10/10/2019 1817   HCO3 38.0 (H) 10/10/2019 1817   TCO2 40 (H) 10/10/2019 1817   ACIDBASEDEF 4.0 (H) 09/16/2019 2334   O2SAT 83.6 10/17/2019 0421     Coagulation Profile: No results for input(s): INR, PROTIME in the last 168 hours.  Cardiac Enzymes: No results for input(s): CKTOTAL, CKMB, CKMBINDEX, TROPONINI in the last 168 hours.  HbA1C: Hgb A1c MFr Bld  Date/Time Value Ref Range Status  09/23/2019 08:58 AM 5.5 4.8 - 5.6 % Final    Comment:    (NOTE) Pre diabetes:          5.7%-6.4% Diabetes:              >6.4% Glycemic control for    <7.0% adults with diabetes   07/24/2019 04:50 AM 6.7 (H) 4.8 - 5.6 % Final    Comment:    (NOTE) Pre diabetes:          5.7%-6.4% Diabetes:              >6.4% Glycemic control for   <7.0% adults with diabetes     CBG: Recent Labs  Lab 10/16/19 1521 10/16/19 1924 10/16/19 2324 10/17/19 0345 10/17/19 0737  GLUCAP 76 93 107* 84 [place] Reika Callanan, DO [facility] 10/17/2019 10:56 AM

## 2019-10-17 NOTE — Consult Note (Signed)
[facility] Nurse wound follow up Wound type: Deep tissue pressure Injury (DTPI) to bilateral lateral feet. Stable dry blood blisters with decreasing elevation. Eschars are drying and stabilizing.  Measurement: Right:20cm x 5cm Left: 19cm x 6cm Wound bed: see above Drainage (amount, consistency, odor) none Periwound: intact, previously noted moisture associated skin damage between 4th and 5th digits has resolved, but treatment with silver hydrofiber continues as a preventive measure. Nurse, Anda Kraft, applied silver Hydrofiber to toes while I was at bedside. Dressing procedure/placement/frequency: Treatment will continue as previously directed, using a betadine swabstick to act as both an astringent and an antimicrobial. Same applied generously to both eschars and allowed to air dry.  Silicone foam dressings applied to heels and feet placed into Prevalon boots.  Posterior neck area of tissue loss has healed and reepithelialized.  Due to the thin layer of epithelial tissue and decreased tensile strength, as the care team agreed on last visit, to place a prophylactic silicone dressing over the area for another 12-14 days. The area remains moist and with the trach securement device there is still a potential for friction.  [place] nursing team will follow, seeing this patient every 7-10 days and will remain available to this patient, the nursing and medical teams.   Thank you, Alphonzo Lemmings, BSN, RN-BC, WTA-C, OCA

## 2019-10-17 NOTE — Progress Notes (Signed)
SLP Cancellation Note  Patient Details Name: DOW BLAHNIK MRN: [id number] DOB: 20-Nov-1975   Cancelled treatment:       Reason Eval/Treat Not Completed: Patient at procedure or test/unavailable(Pt being repositioned by RN. SLP will follow up.)  Owen Pagnotta I. Hardin Negus, [place], [place] Office number [phone] Pager [place] 10/17/2019, 4:42 PM

## 2019-10-17 NOTE — Plan of Care (Signed)
  Problem: Education: Goal: Knowledge of risk factors and measures for prevention of condition will improve Outcome: Progressing   Problem: Coping: Goal: Psychosocial and spiritual needs will be supported Outcome: Progressing   Problem: Respiratory: Goal: Complications related to the disease process, condition or treatment will be avoided or minimized Outcome: Progressing   Problem: Respiratory: Goal: Ability to maintain a clear airway and adequate ventilation will improve Outcome: Progressing   Problem: Role Relationship: Goal: Method of communication will improve Outcome: Progressing   Problem: Education: Goal: Knowledge of General Education information will improve Description: Including pain rating scale, medication(s)/side effects and non-pharmacologic comfort measures Outcome: Progressing   Problem: Health Behavior/Discharge Planning: Goal: Ability to manage health-related needs will improve Outcome: Progressing   Problem: Clinical Measurements: Goal: Ability to maintain clinical measurements within normal limits will improve Outcome: Progressing Goal: Will remain free from infection Outcome: Progressing Goal: Diagnostic test results will improve Outcome: Progressing Goal: Respiratory complications will improve Outcome: Progressing Goal: Cardiovascular complication will be avoided Outcome: Progressing   Problem: Activity: Goal: Risk for activity intolerance will decrease Outcome: Progressing   Problem: Nutrition: Goal: Adequate nutrition will be maintained Outcome: Progressing   Problem: Coping: Goal: Level of anxiety will decrease Outcome: Progressing   Problem: Elimination: Goal: Will not experience complications related to bowel motility Outcome: Progressing Goal: Will not experience complications related to urinary retention Outcome: Progressing   Problem: Pain Managment: Goal: General experience of comfort will improve Outcome: Progressing    Problem: Safety: Goal: Ability to remain free from injury will improve Outcome: Progressing   Problem: Skin Integrity: Goal: Risk for impaired skin integrity will decrease Outcome: Progressing

## 2019-10-17 NOTE — Progress Notes (Signed)
Patient ID: [name], male   DOB: 1976/02/06, 44 y.o.   MRN: [id number]    Advanced Heart Failure Rounding Note   Subjective:    Continues to do well on TC with occasional breaks. Communicative.   Remains weak. Struggles to lift arms and legs.   BP meds adjusted. Systolics [id number]  Objective:   Weight Range:  Vital Signs:   Temp:  [98.3 F (36.8 C)-98.7 F (37.1 C)] 98.6 F (37 C) (03/11 1525) Pulse Rate:  [63-98] 97 (03/11 1700) Resp:  [16-22] 18 (03/11 1700) BP: (95-145)/(46-80) 125/72 (03/11 1700) SpO2:  [96 %-100 %] 100 % (03/11 1700) FiO2 (%):  [40 %] 40 % (03/11 1600) Weight:  [127.8 kg] 127.8 kg (03/11 0500) Last BM Date: 10/16/19  Weight change: Filed Weights   10/15/19 0500 10/16/19 0400 10/17/19 0500  Weight: 125.8 kg 127.4 kg 127.8 kg    Intake/Output:   Intake/Output Summary (Last 24 hours) at 10/17/2019 1751 Last data filed at 10/17/2019 1700 Gross per 24 hour  Intake 1946.42 ml  Output 1900 ml  Net 46.42 ml     Physical Exam : General:  Sitting up in bed. No resp difficulty HEENT: normal Neck: supple. RIJ site ok Carotids 2+ bilat; no bruits. No lymphadenopathy or thryomegaly appreciated. Cor: PMI nondisplaced. Regular rate & rhythm. No rubs, gallops or murmurs. Lungs: clear Abdomen: obese soft, nontender, nondistended. No hepatosplenomegaly. No bruits or masses. Good bowel sounds. +PEG Extremities: no cyanosis, clubbing, rash, 2+ edema Neuro: awake follows commands. Weak   Telemetry: Sinus 90s.  Personally reviewed   Labs: Basic Metabolic Panel: Recent Labs  Lab 10/13/19 0351 10/13/19 0351 10/14/19 0349 10/14/19 0349 10/14/19 1615 10/14/19 1615 10/15/19 0429 10/16/19 0415 10/17/19 0436  NA 143   < > 142  --  139  --  142 141 141  K 3.3*   < > 3.1*  --  3.5  --  3.9 3.9 3.9  CL 94*   < > 87*  --  89*  --  91* 92* 94*  CO2 39*   < > 44*  --  41*  --  41* 38* 38*  GLUCOSE 114*   < > 95  --  158*  --  109* 115* 97  BUN  55*   < > 33*  --  36*  --  39* 45* 36*  CREATININE 0.64   < > 0.59*  --  0.63  --  0.61 0.54* 0.49*  CALCIUM 8.4*   < > 8.6*   < > 8.3*   < > 8.5* 8.7* 8.6*  MG 1.7  --  1.9  --   --   --  2.0 2.1 2.0  PHOS 3.2  --  3.7  --   --   --  3.3 3.0 2.7   < > = values in this interval not displayed.    Liver Function Tests: No results for input(s): AST, ALT, ALKPHOS, BILITOT, PROT, ALBUMIN in the last 168 hours. No results for input(s): LIPASE, AMYLASE in the last 168 hours. No results for input(s): AMMONIA in the last 168 hours.  CBC: Recent Labs  Lab 10/13/19 0351 10/14/19 0349 10/15/19 0429 10/16/19 0415 10/17/19 0436  WBC 8.6 9.0 6.7 5.5 5.1  HGB 8.4* 9.5* 8.6* 8.8* 8.9*  HCT 29.0* 32.6* 29.8* 30.6* 31.4*  MCV 95.7 95.6 96.1 96.5 97.2  PLT 184 233 208 231 227    Cardiac Enzymes: No results for input(s): CKTOTAL, CKMB, CKMBINDEX,  TROPONINI in the last 168 hours.  BNP: BNP (last 3 results) Recent Labs    07/22/19 1039  BNP 15.3    ProBNP (last 3 results) No results for input(s): PROBNP in the last 8760 hours.    Other results:  Imaging: DG Chest Port 1 View  Result Date: 10/16/2019 CLINICAL DATA:  Respiratory failure EXAM: PORTABLE CHEST 1 VIEW COMPARISON:  10/11/2019 FINDINGS: Tracheostomy and right PICC line remain in place, unchanged. Cardiomegaly, vascular congestion and bilateral diffuse airspace disease, unchanged. No visible significant effusions or pneumothorax. No acute bony abnormality. IMPRESSION: Stable diffuse bilateral airspace disease. Electronically Signed   By: Rolm Baptise M.D.   On: 10/16/2019 09:32     Medications:     Scheduled Medications: . acetaminophen (TYLENOL) oral liquid 160 mg/5 mL  650 mg Per Tube Q6H  . amLODipine  5 mg Per Tube Daily  . vitamin C  500 mg Per Tube Daily  . chlorhexidine gluconate (MEDLINE KIT)  15 mL Mouth Rinse BID  . Chlorhexidine Gluconate Cloth  6 each Topical Daily  . cloNIDine  0.1 mg Per Tube BID  .  diatrizoate meglumine-sodium  30 mL Oral Once  . diclofenac Sodium  2 g Topical QID  . enoxaparin (LOVENOX) injection  60 mg Subcutaneous Q24H  . feeding supplement (OSMOLITE 1.5 CAL)  237 mL Per Tube 6 X Daily  . feeding supplement (PRO-STAT SUGAR FREE 64)  60 mL Per Tube QID  . free water  300 mL Per Tube Q6H  . Gerhardt's butt cream   Topical QID  . insulin aspart  0-20 Units Subcutaneous Q4H  . insulin aspart  5 Units Subcutaneous Q4H  . insulin detemir  10 Units Subcutaneous BID  . ipratropium-albuterol  3 mL Nebulization TID  . magic mouthwash  5 mL Oral TID  . mouth rinse  15 mL Mouth Rinse 10 times per day  . montelukast  10 mg Per Tube QHS  . nutrition supplement (JUVEN)  1 packet Per Tube BID BM  . pantoprazole (PROTONIX) IV  40 mg Intravenous BID  . psyllium  1 packet Per Tube BID  . QUEtiapine  25 mg Per Tube QHS  . sodium chloride flush  10-40 mL Intracatheter Q12H  . testosterone  5 g Transdermal Daily    Infusions: . sodium chloride    . sodium chloride 250 mL (10/08/19 1626)  . sodium chloride Stopped (10/13/19 0548)  . dexmedetomidine (PRECEDEX) IV infusion 0.3 mcg/kg/hr (10/17/19 1700)  . meropenem (MERREM) IV 200 mL/hr at 10/17/19 1700  . vancomycin Stopped (10/17/19 0940)    PRN Medications: Place/Maintain arterial line **AND** sodium chloride, sodium chloride, sodium chloride, acetaminophen (TYLENOL) oral liquid 160 mg/5 mL, acetaminophen, albuterol, [COMPLETED] diazepam **FOLLOWED BY** diazepam, hydrALAZINE, HYDROmorphone, magnesium hydroxide, midazolam, ondansetron (ZOFRAN) IV, senna-docusate, sodium chloride, sodium chloride flush   Assessment/Plan:    1. Acute hypoxic/hypercarbic respiratory failure due to COVID PNA: Remained markedly hypoxic despite full support. Intubated 12/26. S/p bilateral CTs 12/26 for pneumothoraces.  TEE on 12/26 LVEF 70% RV ok. VV ECMO begun 12/26. Tracheostomy 12/29.  COVID ARDS at this point. ECMO circuit changed 1/8 & 1/23.  Crescent cannula placed 2/2 (femoral cannuals removed) - Decannulated 3/4 - Doing well with TC trials and PMV valve.  - CXR persists with diffuse infiltrates. Will give lasix again today.  - Completed 14 days  of meropenum on 2/28. Restarted 3/5. Stop after days. Discussed dosing with PharmD personally. - Continue DVT dose heparin  2.  COVID PNA: management of COVID infection per CCM. CXR with bilateral multifocal PNA progressing to likely ARDS.  - s/p 10 days of remdesivir - 12/16, 12/2 s/p tociluzimab - 12/17 s/p convalescent plasma - Decadron at 28m/daily completed 1/11  3. ID: Empiric coverage with vancomycin/cefepime initially for possible secondary bacterial PNA, course completed.   - treated with cefepime and vanc for + cx with staph epidimidis, MSSA and serratia (sensitivities ok). - developed recurrent PNA and sepsis 2/15 - BAL with > 100K serratia. BAL growing > 100K serratia. Completed 14 days mero on 2/28 - Repeat bronch 3/3 looked good  - Continue antibiotics as above for possible abdominal wall abscess at PEG site - looks better - Vanc stop today Mero stop 3/14  4. Anemia: - hgb 8.9. Follow  5. FEN - GF-tube site looks good  5. Hypernatremia -Resolved.  6. AKI - Resolved.   7. DTIs - On both feet.  These are improving  8.  Hypokalemia. -K 3.9 stable  9.  Diffuse muscle weakness. -This is consistent with ICU/steroid/paralytics myopathy.  Continue passive range of motion exercises with PT.  He is improving slowly. No  - I spoke with CIR today they will evaluate  10. HTN - BP meds cut back. SBP improved    Length of Stay: 858   DGlori BickersMD 10/17/2019, 5:51 PM  Advanced Heart Failure Team Pager [phone](M-F; [address]  Please contact [facility] for night-coverage after hours (4p -7a ) and weekends on [email or web address]

## 2019-10-18 LAB — BASIC METABOLIC PANEL
Anion gap: 7 (ref 5–15)
BUN: 38 mg/dL — ABNORMAL HIGH (ref 6–20)
CO2: 38 mmol/L — ABNORMAL HIGH (ref 22–32)
Calcium: 8.4 mg/dL — ABNORMAL LOW (ref 8.9–10.3)
Chloride: 95 mmol/L — ABNORMAL LOW (ref 98–111)
Creatinine, Ser: 0.46 mg/dL — ABNORMAL LOW (ref 0.61–1.24)
GFR calc Af Amer: >60 mL/min (ref >60)
GFR calc non Af Amer: >60 mL/min (ref >60)
Glucose, Bld: 86 mg/dL (ref 70–99)
Potassium: 4.3 mmol/L (ref 3.5–5.1)
Sodium: 140 mmol/L (ref 135–145)

## 2019-10-18 LAB — CBC
HCT: 30.2 % — ABNORMAL LOW (ref 39.0–52.0)
Hemoglobin: 8.6 g/dL — ABNORMAL LOW (ref 13.0–17.0)
MCH: 27.5 pg (ref 26.0–34.0)
MCHC: 28.5 g/dL — ABNORMAL LOW (ref 30.0–36.0)
MCV: 96.5 fL (ref 80.0–100.0)
Platelets: 244 10*3/uL (ref 150–400)
RBC: 3.13 MIL/uL — ABNORMAL LOW (ref 4.22–5.81)
RDW: 15.5 % (ref 11.5–15.5)
WBC: 5.2 10*3/uL (ref 4.0–10.5)
nRBC: 0 % (ref 0.0–0.2)

## 2019-10-18 LAB — PHOSPHORUS: Phosphorus: 2.2 mg/dL — ABNORMAL LOW (ref 2.5–4.6)

## 2019-10-18 LAB — GLUCOSE, CAPILLARY
Glucose-Capillary: 109 mg/dL — ABNORMAL HIGH (ref 70–99)
Glucose-Capillary: 122 mg/dL — ABNORMAL HIGH (ref 70–99)
Glucose-Capillary: 129 mg/dL — ABNORMAL HIGH (ref 70–99)
Glucose-Capillary: 145 mg/dL — ABNORMAL HIGH (ref 70–99)
Glucose-Capillary: 77 mg/dL (ref 70–99)
Glucose-Capillary: 80 mg/dL (ref 70–99)

## 2019-10-18 LAB — COOXEMETRY PANEL
Carboxyhemoglobin: 2.9 % — ABNORMAL HIGH (ref 0.5–1.5)
Methemoglobin: 1 % (ref 0.0–1.5)
O2 Saturation: 82.5 %
Total hemoglobin: 8.9 g/dL — ABNORMAL LOW (ref 12.0–16.0)

## 2019-10-18 LAB — MAGNESIUM: Magnesium: 2 mg/dL (ref 1.7–2.4)

## 2019-10-18 MED ORDER — FREE WATER
150.0000 mL | Freq: Four times a day (QID) | Status: DC
  Administered 2019-10-18 – 2019-11-02 (×59): 150 mL

## 2019-10-18 NOTE — Progress Notes (Signed)
Physical Therapy Treatment Patient Details Name: [name] MRN: [id number] DOB: 1975/12/20 Today's Date: 10/18/2019    History of Present Illness 44 y/o M admitted 12/14 with COVID pneumonia causing acute hypoxemic respiratory failure.   Developed pneumomediastinum 12/23 with concern for possible bilateral small pneumothoraces on 12/24. Pt intubated on 12/26 and started on ECMO on 12/27. Tracheostomy on 12/29 with bleeding from trach an dother sites on 12/31. Bronch on 1/11 and 1/15. Slowly weaning sedation 2/5. Continuing to wean sedation and starting sweep trials to wean ECMO - Decannulated 3/4    PT Comments    Patient progressing well towards PT goals. Worked on activating postural muscles, trunk rotation, and strengthening of UEs/LEs. Noted to have some trace activation of bil quads. Utilized Egress with bed in chair position for trunk work. Able to initiate all movements but continues to have weakness in bil biceps. Pt follows all commands consistently. Pt with some verbalizations using PMV. Tolerated transfer to chair using maxisky. VSS throughout with pt on 35% Fi02 8L TC. Noted to have some coughing and secretions with mobility. Mother present during session. Will follow.   Follow Up Recommendations  CIR;Supervision for mobility/OOB;Supervision/Assistance - 24 hour     Equipment Recommendations  Other (comment)(defer to post acute setting)    Recommendations for Other Services       Precautions / Restrictions Precautions Precautions: Fall;Other (comment) Precaution Comments: trach collar Required Braces or Orthoses: Other Brace Other Brace: Bil Prevalon boots; Bil darco shoes Restrictions Weight Bearing Restrictions: No    Mobility  Bed Mobility Overal bed mobility: Needs Assistance Bed Mobility: Rolling Rolling: Total assist         General bed mobility comments: total to roll for placement of sling  Transfers Overall transfer level: Needs  assistance Equipment used: Ambulation equipment used             General transfer comment: transferred pt to chair with [name]  Ambulation/Gait                 Stairs             Wheelchair Mobility    Modified Rankin (Stroke Patients Only)       Balance Overall balance assessment: Needs assistance Sitting-balance support: Feet supported;Bilateral upper extremity supported Sitting balance-Leahy Scale: Poor Sitting balance - Comments: With bed in chair position, used egress to come to unsupported sitting; worked on activating spinal extensors (with blanket as feedback), pt able to initiate with cues but not sustain. Worked on trunk rotation/reaching with UEs and lateral trunk flexors/UEs pushing off rails.                                    Cognition Arousal/Alertness: Awake/alert Behavior During Therapy: WFL for tasks assessed/performed Overall Cognitive Status: Difficult to assess Area of Impairment: Problem solving;Following commands                       Following Commands: Follows one step commands consistently     Problem Solving: Slow processing General Comments: following one step commands consistently, talking some with PMV, smiling appropriately at jokes.      Exercises General Exercises - Lower Extremity Long Arc Quad: AAROM;5 reps;Seated(trace contraction noted)    General Comments General comments (skin integrity, edema, etc.): VSS on 35% Fi02, 8L TC, Sp02 >92%.      Pertinent Vitals/Pain Pain Assessment: Faces Faces  Pain Scale: No hurt    Home Living                      Prior Function            PT Goals (current goals can now be found in the care plan section) Progress towards PT goals: Progressing toward goals(slowly)    Frequency    Min 3X/week      PT Plan Discharge plan needs to be updated    Co-evaluation PT/OT/SLP Co-Evaluation/Treatment: Yes Reason for Co-Treatment:  Complexity of the patient's impairments (multi-system involvement);For patient/therapist safety;To address functional/ADL transfers PT goals addressed during session: Mobility/safety with mobility;Balance        AM-PAC PT "6 Clicks" Mobility   Outcome Measure  Help needed turning from your back to your side while in a flat bed without using bedrails?: Total Help needed moving from lying on your back to sitting on the side of a flat bed without using bedrails?: Total Help needed moving to and from a bed to a chair (including a wheelchair)?: Total Help needed standing up from a chair using your arms (e.g., wheelchair or bedside chair)?: Total Help needed to walk in hospital room?: Total Help needed climbing 3-5 steps with a railing? : Total 6 Click Score: 6    End of Session Equipment Utilized During Treatment: Oxygen(trach collar, 35% Fi02 8L) Activity Tolerance: Patient tolerated treatment well Patient left: in chair;with call bell/phone within reach;with nursing/sitter in room;with family/visitor present Nurse Communication: Mobility status;Need for lift equipment PT Visit Diagnosis: Muscle weakness (generalized) (M62.81);Other symptoms and signs involving the nervous system (R29.898);Other abnormalities of gait and mobility (R26.89)     Time: 7017-7939 PT Time Calculation (min) (ACUTE ONLY): 44 min  Charges:  $Therapeutic Activity: 8-22 mins                     Marisa Severin, PT, DPT Acute Rehabilitation Services Pager [phone] Office [place] 10/18/2019, 4:43 PM

## 2019-10-18 NOTE — Progress Notes (Signed)
NAME:  [name], MRN:  [id number], DOB:  [birth year], LOS: 88 ADMISSION DATE:  07/22/2019, CONSULTATION DATE:  12/24 REFERRING MD:  Sloan Leiter, CHIEF COMPLAINT:  Dyspnea   Brief History   44 y/o male admitted 12/14 with COVID pneumonia leading to ARDS, pneumomediastinum.  Intubated 12/26, bilateral chest tubes placed.  Past Medical History  GERD HTN Asthma  Significant Hospital Events   12/14 admit with hypoxemic respiratory failure in setting of COVID-19 pneumonia 12/23 noted to have pneumomediastinum on chest x-ray 12/24PCCM consulted for evaluation 12/26 worsening hypoxemia, intubated 12/27 VV fem/fem ECMO cannulation 12/29 tracheostomy >> See bedside nursing event notes for full rundown of procedures after 12/27 1/5 epistaxis, nasal packing 1/30 improved cxr, improved lung compliance, TV 350s on 15PC  1/31 CXR increased infiltrates, +CFB     2/2 Converted to right IJ Crescent cannula. 2/3 PEG tube inserted at bedside 2/14 30 min sweep trial> paO2 105 (18 PEEP, 60% FiO2) pH 7.22, pCO2 87 2/20 2 hours sweep trial 2/21 1.5 hour sweep trial 2/24 7-hour sweep trial.  Increasingly awake. 3/2 - decannulation attempt aborted due to increased hypercapnia when sedated for procedure  Consults:  PCCM, CTS, CHF, Palliative  Procedures:  PICC 1/3 >> R radial a-line 1/20 >> R IJ ECMO cath 2/3 >> Tracheostomy 12/29 >> 10/14/2019 PEG tube replacement>>  Significant Diagnostic Tests:  CT chest 12/22>>extensive pneumomediastinum, multifocal patchy bilateral groundglass opacities CXR 1/25 >> improving bilateral infiltrates. ECMO cannulae in place. CXR 2/5 >> clearing bilateral infiltrates  Micro Data:  BCx2 12/14 >>negative  Sputum 12/15 >> OPF 12/28 blood > negative 12/29 > staph aureus 1/4 bronch:  1/2 acid fast smear bronch: negative 1/2 fungal cx bronch: negative1/12 BAL: serratia, staph epidermidisu 1/4 BAL > MSSA, Serratia 1/9 blood > negative 1/9 Resp culture >  MSSA, serratia 1/11 serratia > negative 1/12 fungus culture > negative 1/12 BAL > serratia 1/18 BAL >> rare Serratia. 1/24 blood > negative 1/30 blood > negative 1/30 resp culture > serratia 2/1 BAL >> rare Serratia - enterococcus 2/1 aspergillus Ag > negative 2/2 resp culture > serratia  2/2 blood > negative 2/5 urine > negative 2/9 resp: serratia 2/15 BAL serratia  Antimicrobials:  Received remdesivir, steroids, actemra and convalescent plasma  5 days cefepime 12/24-12/29 Cefepime 1/4->1/13 vanc 1/4> 1/11 Ceftriaxone 1/14 > 1/27 vanc x1 1/20 Cefazolin 1/29 > 2/2 cefuroxim 2/2 vanc 2/2 Ceftriaxone x1 2/4 Meropenem 2/15>>2/21 Tobramycin inhaled 2/15>>2/21  Interim history/subjective:   Again tolerated trach collar yesterday.  Overall doing well.  Objective   Blood pressure 139/74, pulse 95, temperature 98.9 F (37.2 C), temperature source Oral, resp. rate 18, height _0  (1.727 m), weight 128.2 kg, SpO2 100 %.    Vent Mode: PCV FiO2 (%):  [40 %] 40 % Set Rate:  [20 bmp] 20 bmp PEEP:  [8 cmH20] 8 cmH20 Plateau Pressure:  [18 cmH20-22 cmH20] 22 cmH20   Intake/Output Summary (Last 24 hours) at 10/18/2019 1052 Last data filed at 10/18/2019 0700 Gross per 24 hour  Intake 1403.98 ml  Output 1950 ml  Net -546.02 ml   Filed Weights   10/16/19 0400 10/17/19 0500 10/18/19 0348  Weight: 127.4 kg 127.8 kg 128.2 kg    Examination: General appearance: 44 y.o., male, alert oriented, male, alert oriented  Eyes: anicteric sclerae, moist conjunctivae; no lid-lag; PERRLA, tracking appropriately HENT: NCAT; oropharynx, MMM, no mucosal ulcerations Neck: Trachea midline; trach in place Lungs: CTAB, no crackles, no wheeze CV: RRR, S1, S2, no MRGs  Abdomen: Soft,  non-tender; non-distended, BS present  Extremities: No peripheral edema, radial and DP pulses present bilaterally  Skin: Normal temperature, turgor and texture; no rash Psych: Appropriate affect Neuro: Alert and oriented to person and  place, no focal deficit     ABG    Component Value Date/Time   PHART 7.384 10/10/2019 1817   PCO2ART 63.7 (H) 10/10/2019 1817   PO2ART 76.0 (L) 10/10/2019 1817   HCO3 38.0 (H) 10/10/2019 1817   TCO2 40 (H) 10/10/2019 1817   ACIDBASEDEF 4.0 (H) 09/16/2019 2334   O2SAT 82.5 10/18/2019 0435     Resolved Hospital Problem list   Pneumothoraces-resolved.  Assessment & Plan:   ARDS due to COVID 19, now requiring mechanical ventilation support at night Decannulated from VV ECMO on 3/4 after 65 days. Tracheostomy tube in place - TCT as tolerated  - PMV with SLP if possible   Dislodged PEG tube Indurated area of the skin right lateral PEG tube. - resolved   AKI with azotemia - improving  Positive cumulative fluid balance Hypernatremia has corrected Severe third spacing -diuresis prn   Hypertension - tapering oral antihypertensive - stable at this time   Serratia pneumonia> treated.   Neuromuscular weakness - OOB to chair as much as tolerated  - IPR consult/eval pending   Bilateral lower feet deep tissue injury Wound care continued for feet  Anemia due to critical illnes Conservative transfusion threshold  Best practice:  Pain/Anxiety/Delirium protocol (if indicated): PRNs only VAP protocol (if indicated): Bundle in place. Respiratory support goals: Trach collar trial DVT prophylaxis: Lovenox GI prophylaxis: PPI Nutrition: TF Fluid status goals:  See above Urinary catheter: Guide hemodynamic management Glucose control: controlled with current regimen Mobility/therapy needs: Bedrest.  Daily labs: As needed Code Status: Full code  Disposition: ICU Family: 10/15/2019 patient updated at bedside  Labs   CBC: CBC Latest Ref Rng & Units 10/18/2019 10/17/2019 10/16/2019  WBC 4.0 - 10.5 K/uL 5.2 5.1 5.5  Hemoglobin 13.0 - 17.0 g/dL 8.6(L) 8.9(L) 8.8(L)  Hematocrit 39.0 - 52.0 % 30.2(L) 31.4(L) 30.6(L)  Platelets 150 - 400 K/uL 244 227 231     Basic Metabolic  Panel: BMP Latest Ref Rng & Units 10/18/2019 10/17/2019 10/16/2019  Glucose 70 - 99 mg/dL 86 97 115(H)  BUN 6 - 20 mg/dL 38(H) 36(H) 45(H)  Creatinine 0.61 - 1.24 mg/dL 0.46(L) 0.49(L) 0.54(L)  Sodium 135 - 145 mmol/L 140 141 141  Potassium 3.5 - 5.1 mmol/L 4.3 3.9 3.9  Chloride 98 - 111 mmol/L 95(L) 94(L) 92(L)  CO2 22 - 32 mmol/L 38(H) 38(H) 38(H)  Calcium 8.9 - 10.3 mg/dL 8.4(L) 8.6(L) 8.7(L)     GFR: Estimated Creatinine Clearance: 152.2 mL/min (A) (by C-G formula based on SCr of 0.46 mg/dL (L)). Recent Labs  Lab 10/12/19 0431 10/13/19 0351 10/15/19 0429 10/16/19 0415 10/17/19 0436 10/18/19 0313  WBC 6.0   < > 6.7 5.5 5.1 5.2  LATICACIDVEN 1.2  --   --   --   --   --    < > = values in this interval not displayed.    Liver Function Tests: No results for input(s): AST, ALT, ALKPHOS, BILITOT, PROT, ALBUMIN in the last 168 hours. No results for input(s): LIPASE, AMYLASE in the last 168 hours. No results for input(s): AMMONIA in the last 168 hours.  ABG    Component Value Date/Time   PHART 7.384 10/10/2019 1817   PCO2ART 63.7 (H) 10/10/2019 1817   PO2ART 76.0 (L) 10/10/2019 1817   HCO3  38.0 (H) 10/10/2019 1817   TCO2 40 (H) 10/10/2019 1817   ACIDBASEDEF 4.0 (H) 09/16/2019 2334   O2SAT 82.5 10/18/2019 0435     Coagulation Profile: No results for input(s): INR, PROTIME in the last 168 hours.  Cardiac Enzymes: No results for input(s): CKTOTAL, CKMB, CKMBINDEX, TROPONINI in the last 168 hours.  HbA1C: Hgb A1c MFr Bld  Date/Time Value Ref Range Status  09/23/2019 08:58 AM 5.5 4.8 - 5.6 % Final    Comment:    (NOTE) Pre diabetes:          5.7%-6.4% Diabetes:              >6.4% Glycemic control for   <7.0% adults with diabetes   07/24/2019 04:50 AM 6.7 (H) 4.8 - 5.6 % Final    Comment:    (NOTE) Pre diabetes:          5.7%-6.4% Diabetes:              >6.4% Glycemic control for   <7.0% adults with diabetes     CBG: Recent Labs  Lab 10/17/19 1527  10/17/19 1923 10/17/19 2323 10/18/19 0341 10/18/19 0731  GLUCAP 69* 107* 95 109* [place], DO [facility] 10/18/2019 10:52 AM

## 2019-10-18 NOTE — Progress Notes (Signed)
Inpatient Diabetes Program Recommendations  AACE/ADA: New Consensus Statement on Inpatient Glycemic Control (2015)  Target Ranges:  Prepandial:   less than 140 mg/dL      Peak postprandial:   less than 180 mg/dL (1-2 hours)      Critically ill patients:  140 - 180 mg/dL   Lab Results  Component Value Date   GLUCAP 80 10/18/2019   HGBA1C 5.5 09/23/2019    Review of Glycemic Control Results for [name], [name] (MRN [id number]) as of 10/18/2019 11:48  Ref. Range 10/17/2019 19:23 10/17/2019 23:23 10/18/2019 03:41 10/18/2019 07:31 10/18/2019 11:19  Glucose-Capillary Latest Ref Range: 70 - 99 mg/dL 107 (H) 95 109 (H) 77 80      Current orders for Inpatient glycemic control:  Novolog 0-20 Q4H + Levemir 10 units daily  Inpatient Diabetes Program Recommendations:  Noted levemir was decreased to 10 units daily and meal coverage was discontinued  If CBG's continue to run low, please consider  Correction (SSI): Novolog 0-9 Q4H  Thank you, Reche Dixon, RN, BSN Diabetes Coordinator Inpatient Diabetes Program [phone] (team pager from 8a-5p)

## 2019-10-18 NOTE — Progress Notes (Signed)
Patient ID: [name], male   DOB: 1975-08-20, 44 y.o.   MRN: [id number]    Advanced Heart Failure Rounding Note   Subjective:    Continues to do well on TC with occasional breaks. Was on vent support overnight to rest. Now back on TC.   Remains weak. Struggles to lift arms and legs. Had PMV yesterday   Denies pain.   Objective:   Weight Range:  Vital Signs:   Temp:  [98.5 F (36.9 C)-99.3 F (37.4 C)] 98.9 F (37.2 C) (03/12 0343) Pulse Rate:  [87-100] 95 (03/12 0831) Resp:  [9-25] 18 (03/12 0831) BP: (105-145)/(46-84) 139/74 (03/12 0831) SpO2:  [98 %-100 %] 100 % (03/12 0831) FiO2 (%):  [40 %] 40 % (03/12 0831) Weight:  [128.2 kg] 128.2 kg (03/12 0348) Last BM Date: 10/17/19  Weight change: Filed Weights   10/16/19 0400 10/17/19 0500 10/18/19 0348  Weight: 127.4 kg 127.8 kg 128.2 kg    Intake/Output:   Intake/Output Summary (Last 24 hours) at 10/18/2019 0855 Last data filed at 10/18/2019 0700 Gross per 24 hour  Intake 1810.04 ml  Output 1950 ml  Net -139.96 ml     Physical Exam : General:  Sitting up in bed. No resp difficulty HEENT: normal Neck: supple. no JVD. + trach Carotids 2+ bilat; no bruits. No lymphadenopathy or thryomegaly appreciated. Cor: PMI nondisplaced. Regular rate & rhythm. No rubs, gallops or murmurs. Lungs: clear Abdomen: obese soft, nontender, nondistended. No hepatosplenomegaly. No bruits or masses. Good bowel sounds. + PEG tube Extremities: no cyanosis, clubbing, rash, 2+ edema Neuro: alert & orientedx3, cranial nerves grossly intact. moves all 4 extremities w/o difficulty. Affect pleasant   Telemetry: Sinus 90s.  Personally reviewed   Labs: Basic Metabolic Panel: Recent Labs  Lab 10/14/19 0349 10/14/19 0349 10/14/19 1615 10/14/19 1615 10/15/19 0429 10/15/19 0429 10/16/19 0415 10/17/19 0436 10/18/19 0313  NA 142   < > 139  --  142  --  141 141 140  K 3.1*   < > 3.5  --  3.9  --  3.9 3.9 4.3  CL 87*   < > 89*  --   91*  --  92* 94* 95*  CO2 44*   < > 41*  --  41*  --  38* 38* 38*  GLUCOSE 95   < > 158*  --  109*  --  115* 97 86  BUN 33*   < > 36*  --  39*  --  45* 36* 38*  CREATININE 0.59*   < > 0.63  --  0.61  --  0.54* 0.49* 0.46*  CALCIUM 8.6*   < > 8.3*   < > 8.5*   < > 8.7* 8.6* 8.4*  MG 1.9  --   --   --  2.0  --  2.1 2.0 2.0  PHOS 3.7  --   --   --  3.3  --  3.0 2.7 2.2*   < > = values in this interval not displayed.    Liver Function Tests: No results for input(s): AST, ALT, ALKPHOS, BILITOT, PROT, ALBUMIN in the last 168 hours. No results for input(s): LIPASE, AMYLASE in the last 168 hours. No results for input(s): AMMONIA in the last 168 hours.  CBC: Recent Labs  Lab 10/14/19 0349 10/15/19 0429 10/16/19 0415 10/17/19 0436 10/18/19 0313  WBC 9.0 6.7 5.5 5.1 5.2  HGB 9.5* 8.6* 8.8* 8.9* 8.6*  HCT 32.6* 29.8* 30.6* 31.4* 30.2*  MCV 95.6 96.1 96.5 97.2 96.5  PLT 233 208 231 227 244    Cardiac Enzymes: No results for input(s): CKTOTAL, CKMB, CKMBINDEX, TROPONINI in the last 168 hours.  BNP: BNP (last 3 results) Recent Labs    07/22/19 1039  BNP 15.3    ProBNP (last 3 results) No results for input(s): PROBNP in the last 8760 hours.    Other results:  Imaging: No results found.   Medications:     Scheduled Medications: . acetaminophen (TYLENOL) oral liquid 160 mg/5 mL  650 mg Per Tube Q6H  . amLODipine  5 mg Per Tube Daily  . vitamin C  500 mg Per Tube Daily  . chlorhexidine gluconate (MEDLINE KIT)  15 mL Mouth Rinse BID  . Chlorhexidine Gluconate Cloth  6 each Topical Daily  . cloNIDine  0.1 mg Per Tube BID  . diatrizoate meglumine-sodium  30 mL Oral Once  . diclofenac Sodium  2 g Topical QID  . enoxaparin (LOVENOX) injection  60 mg Subcutaneous Q24H  . feeding supplement (OSMOLITE 1.5 CAL)  237 mL Per Tube 6 X Daily  . feeding supplement (PRO-STAT SUGAR FREE 64)  60 mL Per Tube QID  . free water  300 mL Per Tube Q6H  . Gerhardt's butt cream   Topical QID   . insulin aspart  0-20 Units Subcutaneous Q4H  . insulin detemir  10 Units Subcutaneous BID  . ipratropium-albuterol  3 mL Nebulization TID  . magic mouthwash  5 mL Oral TID  . mouth rinse  15 mL Mouth Rinse 10 times per day  . montelukast  10 mg Per Tube QHS  . nutrition supplement (JUVEN)  1 packet Per Tube BID BM  . pantoprazole (PROTONIX) IV  40 mg Intravenous BID  . psyllium  1 packet Per Tube BID  . QUEtiapine  25 mg Per Tube QHS  . sodium chloride flush  10-40 mL Intracatheter Q12H  . testosterone  5 g Transdermal Daily    Infusions: . sodium chloride    . sodium chloride 250 mL (10/08/19 1626)  . sodium chloride Stopped (10/13/19 0548)  . dexmedetomidine (PRECEDEX) IV infusion 0.3 mcg/kg/hr (10/18/19 0655)  . meropenem (MERREM) IV Stopped (10/18/19 0042)    PRN Medications: Place/Maintain arterial line **AND** sodium chloride, sodium chloride, sodium chloride, acetaminophen (TYLENOL) oral liquid 160 mg/5 mL, acetaminophen, albuterol, [COMPLETED] diazepam **FOLLOWED BY** diazepam, hydrALAZINE, HYDROmorphone, magnesium hydroxide, midazolam, ondansetron (ZOFRAN) IV, senna-docusate, sodium chloride, sodium chloride flush   Assessment/Plan:    1. Acute hypoxic/hypercarbic respiratory failure due to COVID PNA: Remained markedly hypoxic despite full support. Intubated 12/26. S/p bilateral CTs 12/26 for pneumothoraces.  TEE on 12/26 LVEF 70% RV ok. VV ECMO begun 12/26. Tracheostomy 12/29.  COVID ARDS at this point. ECMO circuit changed 1/8 & 1/23. Crescent cannula placed 2/2 (femoral cannuals removed) - Decannulated 3/4 - Doing well with TC trials and PMV valve. Resting on vent at night - CXR persists with diffuse infiltrates. Will give lasix again today - Continue DVT dose heparin - Continues to improve as strength improves. Mobilize  2. COVID PNA: management of COVID infection per CCM. CXR with bilateral multifocal PNA progressing to likely ARDS.  - s/p 10 days of  remdesivir - 12/16, 12/2 s/p tociluzimab - 12/17 s/p convalescent plasma - Decadron at 67m/daily completed 1/11  3. ID: Empiric coverage with vancomycin/cefepime initially for possible secondary bacterial PNA, course completed.   - treated with cefepime and vanc for + cx with staph epidimidis, MSSA  and serratia (sensitivities ok). - developed recurrent PNA and sepsis 2/15 - BAL with > 100K serratia. BAL growing > 100K serratia. Completed 14 days mero on 2/28 - Repeat bronch 3/3 looked good  - Vanc stop 3/11 Mero stop 3/14 - Afebrile  4. Anemia: - hgb 8.9. Follow  5. FEN - GF-tube site looks good  5. Hypernatremia -Resolved. - Cut back free water   6. AKI - Resolved.   7. DTIs - On both feet.  These are improving  8.  Hypokalemia. -K 3.9 stable  9.  Diffuse muscle weakness. -This is consistent with ICU/steroid/paralytics myopathy.  Continue passive range of motion exercises with PT.  He is improving slowly. No  - I spoke with CIR and they will evalauate  10. HTN - BP meds cut back. SBP improved    Length of Stay: 4      MD 10/18/2019, 8:55 AM  Advanced Heart Failure Team Pager [phone] (M-F; [place])  Please contact [facility] for night-coverage after hours (4p -7a ) and weekends on [email or web address]

## 2019-10-18 NOTE — TOC Progression Note (Signed)
Transition of Care [facility]) - Progression Note    Patient Details  Name: [name] MRN: [id number] Date of Birth: [birth year]  Transition of Care [facility]) CM/SW [place], RN Phone Number: 10/18/2019, 11:14 AM  Clinical Narrative:    Case Management met with progression team and Dr. Valeta Harms regarding patient's disposition.  LTACH is no longer an option, consulting physician's expressed that the patient should be placed in CIR or remain in ICU until the patient can progress to home.  Case Management will continue to follow for patient care needs.   Expected Discharge Plan: Long Term Acute Care (LTAC) Barriers to Discharge: Continued Medical Work up(Continues on IV Rocephin, Nimbex and Precedex.)  Expected Discharge Plan and Services Expected Discharge Plan: [place] (LTAC)   Discharge Planning Services: CM Consult   Living arrangements for the past 2 months: Single Family Home                                       Social Determinants of Health (SDOH) Interventions    Readmission Risk Interventions No flowsheet data found.

## 2019-10-18 NOTE — Progress Notes (Signed)
Occupational Therapy Treatment Patient Details Name: [name] MRN: [id number] DOB: [birth year] Today's Date: 10/18/2019    History of present illness 44 y/o M admitted 12/14 with COVID pneumonia causing acute hypoxemic respiratory failure.   Developed pneumomediastinum 12/23 with concern for possible bilateral small pneumothoraces on 12/24. Pt intubated on 12/26 and started on ECMO on 12/27. Tracheostomy on 12/29 with bleeding from trach an dother sites on 12/31. Bronch on 1/11 and 1/15. Slowly weaning sedation 2/5. Continuing to wean sedation and starting sweep trials to wean ECMO - Decannulated 3/4   OT comments  Pt progressing well toward stated goals. Focused session on truncal exercise to progress engagement in BADL. Used blanket as feedback for trunk extension and appropriate posture in egress position in bed. From this position pt engaged in R/L spinal rotation x5 and lateral trunk exercise x5 each side. AAROM performed on Bil biceps. BUE exercise printed out and placed in room. Educated mother on how to administer for patient when therapy is not present. Maxisky then used to lift pt to chair. PMV donned briefly at end of session, pt able to mobilize secretions well. Pt on trach collar 35% FiO2 and 8L SpO2 with VSS. Updated recommendations for CIR for intensive therapy prior to returning home. Will continue to follow.   Follow Up Recommendations  CIR;Supervision/Assistance - 24 hour    Equipment Recommendations  None recommended by OT    Recommendations for Other Services      Precautions / Restrictions Precautions Precautions: Fall;Other (comment) Precaution Comments: trach collar Required Braces or Orthoses: Other Brace Other Brace: Bil Prevalon boots; Bil darco shoes Restrictions Weight Bearing Restrictions: No       Mobility Bed Mobility Overal bed mobility: Needs Assistance Bed Mobility: Rolling Rolling: Total assist         General bed mobility comments: total  to roll for placement of sling, minimal use of BUEs due to decreased strength  Transfers Overall transfer level: Needs assistance Equipment used: Ambulation equipment used             General transfer comment: transferred pt to chair with maxisky    Balance Overall balance assessment: Needs assistance Sitting-balance support: Feet supported;Bilateral upper extremity supported Sitting balance-Leahy Scale: Poor Sitting balance - Comments: With bed in chair position, used egress to come to unsupported sitting; worked on activating spinal extensors (with blanket as feedback), pt able to initiate with cues but not sustain. Worked on trunk rotation/reaching with UEs and lateral trunk flexors/UEs pushing off rails.                                   ADL either performed or assessed with clinical judgement   ADL Overall ADL's : Needs assistance/impaired     Grooming: Maximal assistance;Wash/dry face;Oral care Grooming Details (indicate cue type and reason): assist for grip strength, maintaining arms up against gravity                               General ADL Comments: session focused on postural control and therapeutic exercise     Vision   Vision Assessment?: No apparent visual deficits   Perception     Praxis      Cognition Arousal/Alertness: Awake/alert Behavior During Therapy: WFL for tasks assessed/performed Overall Cognitive Status: Difficult to assess Area of Impairment: Problem solving;Following commands  Following Commands: Follows one step commands consistently     Problem Solving: Slow processing General Comments: following one step commands consistently, talking some with PMV, smiling appropriately at jokes.        Exercises General Exercises - Lower Extremity Long Arc Quad: AAROM;5 reps;Seated(trace contraction noted)   Shoulder Instructions       General Comments VSS on 35% Fi02, 8L TC, Sp02  >92%.    Pertinent Vitals/ Pain       Pain Assessment: No/denies pain Faces Pain Scale: No hurt  Home Living                                          Prior Functioning/Environment              Frequency  Min 3X/week        Progress Toward Goals  OT Goals(current goals can now be found in the care plan section)  Progress towards OT goals: Progressing toward goals  Acute Rehab OT Goals Patient Stated Goal: regain strength and independence OT Goal Formulation: With patient/family Time For Goal Achievement: 10/30/19 Potential to Achieve Goals: Good  Plan Discharge plan needs to be updated    Co-evaluation    PT/OT/SLP Co-Evaluation/Treatment: Yes Reason for Co-Treatment: For patient/therapist safety;To address functional/ADL transfers PT goals addressed during session: Mobility/safety with mobility;Balance OT goals addressed during session: ADL's and self-care;Proper use of Adaptive equipment and DME;Strengthening/ROM      AM-PAC OT "6 Clicks" Daily Activity     Outcome Measure   Help from another person eating meals?: Total Help from another person taking care of personal grooming?: A Lot Help from another person toileting, which includes using toliet, bedpan, or urinal?: Total Help from another person bathing (including washing, rinsing, drying)?: Total Help from another person to put on and taking off regular upper body clothing?: Total Help from another person to put on and taking off regular lower body clothing?: Total 6 Click Score: 7    End of Session Equipment Utilized During Treatment: Oxygen  OT Visit Diagnosis: Muscle weakness (generalized) (M62.81)   Activity Tolerance Patient tolerated treatment well   Patient Left in chair;with call bell/phone within reach;with family/visitor present   Nurse Communication Mobility status        Time: 2072-1828 OT Time Calculation (min): 44 min  Charges: OT General Charges $OT Visit:  1 Visit OT Treatments $Self Care/Home Management : 8-22 mins $Neuromuscular Re-education: 8-22 mins  Zenovia Jarred, MSOT, OTR/L [facility] Office Number: [phone] Pager: [phone]  Zenovia Jarred 10/18/2019, 5:25 PM

## 2019-10-19 LAB — COOXEMETRY PANEL
Carboxyhemoglobin: 2.8 % — ABNORMAL HIGH (ref 0.5–1.5)
Methemoglobin: 1.1 % (ref 0.0–1.5)
O2 Saturation: 79 %
Total hemoglobin: 8.8 g/dL — ABNORMAL LOW (ref 12.0–16.0)

## 2019-10-19 LAB — BASIC METABOLIC PANEL
Anion gap: 8 (ref 5–15)
BUN: 41 mg/dL — ABNORMAL HIGH (ref 6–20)
CO2: 36 mmol/L — ABNORMAL HIGH (ref 22–32)
Calcium: 8.4 mg/dL — ABNORMAL LOW (ref 8.9–10.3)
Chloride: 96 mmol/L — ABNORMAL LOW (ref 98–111)
Creatinine, Ser: 0.45 mg/dL — ABNORMAL LOW (ref 0.61–1.24)
GFR calc Af Amer: >60 mL/min (ref >60)
GFR calc non Af Amer: >60 mL/min (ref >60)
Glucose, Bld: 79 mg/dL (ref 70–99)
Potassium: 4 mmol/L (ref 3.5–5.1)
Sodium: 140 mmol/L (ref 135–145)

## 2019-10-19 LAB — CBC
HCT: 30.2 % — ABNORMAL LOW (ref 39.0–52.0)
Hemoglobin: 8.7 g/dL — ABNORMAL LOW (ref 13.0–17.0)
MCH: 27.5 pg (ref 26.0–34.0)
MCHC: 28.8 g/dL — ABNORMAL LOW (ref 30.0–36.0)
MCV: 95.6 fL (ref 80.0–100.0)
Platelets: 243 10*3/uL (ref 150–400)
RBC: 3.16 MIL/uL — ABNORMAL LOW (ref 4.22–5.81)
RDW: 15.5 % (ref 11.5–15.5)
WBC: 5.1 10*3/uL (ref 4.0–10.5)
nRBC: 0 % (ref 0.0–0.2)

## 2019-10-19 LAB — MAGNESIUM: Magnesium: 2 mg/dL (ref 1.7–2.4)

## 2019-10-19 LAB — GLUCOSE, CAPILLARY
Glucose-Capillary: 122 mg/dL — ABNORMAL HIGH (ref 70–99)
Glucose-Capillary: 97 mg/dL (ref 70–99)
Glucose-Capillary: 123 mg/dL — ABNORMAL HIGH (ref 70–99)
Glucose-Capillary: 84 mg/dL (ref 70–99)
Glucose-Capillary: 79 mg/dL (ref 70–99)

## 2019-10-19 LAB — PHOSPHORUS: Phosphorus: 2.4 mg/dL — ABNORMAL LOW (ref 2.5–4.6)

## 2019-10-19 MED ORDER — SENNA 8.6 MG PO TABS: 1.0000 | ORAL_TABLET | Freq: Every day | ORAL | Status: DC

## 2019-10-19 MED ORDER — DIPHENOXYLATE-ATROPINE 2.5-0.025 MG/5ML PO LIQD
10.0000 mL | Freq: Four times a day (QID) | ORAL | Status: DC | PRN
  Administered 2019-10-19 – 2019-10-24 (×5): 10 mL
  Filled 2019-10-19 (×6): qty 10

## 2019-10-19 NOTE — Progress Notes (Signed)
Pt placed back on full vent support on documented settings. Pt tolerating well

## 2019-10-19 NOTE — Progress Notes (Signed)
Patient ID: [name], male   DOB: Oct 05, 1975, 44 y.o.   MRN: [id number]    Advanced Heart Failure Rounding Note   Subjective:    Having frequent loose stools.  Does not want rectal tube.  Remains on ventilator overnight with pressure support.  Switching back to trach collar this morning.  Off Lasix.  Weight stable this morning.  Denies chest pain or shortness of breath.  Remains weak.  Low-grade temp.  Objective:   Weight Range:  Vital Signs:   Temp:  [99.1 F (37.3 C)-99.7 F (37.6 C)] 99.5 F (37.5 C) (03/13 0800) Pulse Rate:  [40-119] 107 (03/13 1100) Resp:  [16-27] 19 (03/13 1100) BP: (106-145)/(56-116) 129/56 (03/13 1100) SpO2:  [93 %-100 %] 100 % (03/13 1100) FiO2 (%):  [35 %-40 %] 40 % (03/13 0823) Weight:  [128.1 kg] 128.1 kg (03/13 0454) Last BM Date: 10/18/19  Weight change: Filed Weights   10/17/19 0500 10/18/19 0348 10/19/19 0454  Weight: 127.8 kg 128.2 kg 128.1 kg    Intake/Output:   Intake/Output Summary (Last 24 hours) at 10/19/2019 1145 Last data filed at 10/19/2019 1100 Gross per 24 hour  Intake 2159.55 ml  Output 1130 ml  Net 1029.55 ml     Physical Exam : General:  Sitting up in bed. On vent through trach HEENT: normal Neck: supple.RIJ site ok  + trach. Carotids 2+ bilat; no bruits. No lymphadenopathy or thryomegaly appreciated. Cor: PMI nondisplaced. Regular tachyNo rubs, gallops or murmurs. Lungs: clear Abdomen: obesesoft, nontender, nondistended. No hepatosplenomegaly. No bruits or masses. Good bowel sounds. Extremities: no cyanosis, clubbing, rash, 1-2+ edema Neuro: alert and communicative. Remains weak UEs> LEs,     Telemetry: Sinus 100-110.  Personally reviewed   Labs: Basic Metabolic Panel: Recent Labs  Lab 10/15/19 0429 10/15/19 0429 10/16/19 0415 10/16/19 0415 10/17/19 0436 10/18/19 0313 10/19/19 0309  NA 142  --  141  --  141 140 140  K 3.9  --  3.9  --  3.9 4.3 4.0  CL 91*  --  92*  --  94* 95* 96*  CO2  41*  --  38*  --  38* 38* 36*  GLUCOSE 109*  --  115*  --  97 86 79  BUN 39*  --  45*  --  36* 38* 41*  CREATININE 0.61  --  0.54*  --  0.49* 0.46* 0.45*  CALCIUM 8.5*   < > 8.7*   < > 8.6* 8.4* 8.4*  MG 2.0  --  2.1  --  2.0 2.0 2.0  PHOS 3.3  --  3.0  --  2.7 2.2* 2.4*   < > = values in this interval not displayed.    Liver Function Tests: No results for input(s): AST, ALT, ALKPHOS, BILITOT, PROT, ALBUMIN in the last 168 hours. No results for input(s): LIPASE, AMYLASE in the last 168 hours. No results for input(s): AMMONIA in the last 168 hours.  CBC: Recent Labs  Lab 10/15/19 0429 10/16/19 0415 10/17/19 0436 10/18/19 0313 10/19/19 0309  WBC 6.7 5.5 5.1 5.2 5.1  HGB 8.6* 8.8* 8.9* 8.6* 8.7*  HCT 29.8* 30.6* 31.4* 30.2* 30.2*  MCV 96.1 96.5 97.2 96.5 95.6  PLT 208 231 227 244 243    Cardiac Enzymes: No results for input(s): CKTOTAL, CKMB, CKMBINDEX, TROPONINI in the last 168 hours.  BNP: BNP (last 3 results) Recent Labs    07/22/19 1039  BNP 15.3    ProBNP (last 3 results) No results  for input(s): PROBNP in the last 8760 hours.    Other results:  Imaging: No results found.   Medications:     Scheduled Medications: . acetaminophen (TYLENOL) oral liquid 160 mg/5 mL  650 mg Per Tube Q6H  . amLODipine  5 mg Per Tube Daily  . vitamin C  500 mg Per Tube Daily  . chlorhexidine gluconate (MEDLINE KIT)  15 mL Mouth Rinse BID  . Chlorhexidine Gluconate Cloth  6 each Topical Daily  . cloNIDine  0.1 mg Per Tube BID  . diatrizoate meglumine-sodium  30 mL Oral Once  . diclofenac Sodium  2 g Topical QID  . enoxaparin (LOVENOX) injection  60 mg Subcutaneous Q24H  . feeding supplement (OSMOLITE 1.5 CAL)  237 mL Per Tube 6 X Daily  . feeding supplement (PRO-STAT SUGAR FREE 64)  60 mL Per Tube QID  . free water  150 mL Per Tube Q6H  . Gerhardt's butt cream   Topical QID  . insulin aspart  0-20 Units Subcutaneous Q4H  . insulin detemir  10 Units Subcutaneous BID  .  ipratropium-albuterol  3 mL Nebulization TID  . magic mouthwash  5 mL Oral TID  . mouth rinse  15 mL Mouth Rinse 10 times per day  . montelukast  10 mg Per Tube QHS  . nutrition supplement (JUVEN)  1 packet Per Tube BID BM  . pantoprazole (PROTONIX) IV  40 mg Intravenous BID  . psyllium  1 packet Per Tube BID  . QUEtiapine  25 mg Per Tube QHS  . sodium chloride flush  10-40 mL Intracatheter Q12H    Infusions: . sodium chloride    . sodium chloride 250 mL (10/08/19 1626)  . sodium chloride Stopped (10/13/19 0548)  . dexmedetomidine (PRECEDEX) IV infusion 0.3 mcg/kg/hr (10/19/19 1100)  . meropenem (MERREM) IV Stopped (10/19/19 0954)    PRN Medications: Place/Maintain arterial line **AND** sodium chloride, sodium chloride, sodium chloride, acetaminophen (TYLENOL) oral liquid 160 mg/5 mL, acetaminophen, albuterol, [COMPLETED] diazepam **FOLLOWED BY** diazepam, diphenoxylate-atropine, hydrALAZINE, HYDROmorphone, magnesium hydroxide, midazolam, ondansetron (ZOFRAN) IV, senna-docusate, sodium chloride, sodium chloride flush   Assessment/Plan:    1. Acute hypoxic/hypercarbic respiratory failure due to COVID PNA: Remained markedly hypoxic despite full support. Intubated 12/26. S/p bilateral CTs 12/26 for pneumothoraces.  TEE on 12/26 LVEF 70% RV ok. VV ECMO begun 12/26. Tracheostomy 12/29.  COVID ARDS at this point. ECMO circuit changed 1/8 & 1/23. Crescent cannula placed 2/2 (femoral cannuals removed) - Decannulated 3/4 - Doing well with trach collar trials.  Resting on ventilator at night.  We will continue to wean. - CXR persists with diffuse infiltrates.  No real change. -We will hold diuretics today given ongoing diarrhea.  2. COVID PNA: management of COVID infection per CCM. CXR with bilateral multifocal PNA progressing to likely ARDS.  - s/p 10 days of remdesivir - 12/16, 12/2 s/p tociluzimab - 12/17 s/p convalescent plasma - Decadron at 58m/daily completed 1/11  3. ID: Empiric  coverage with vancomycin/cefepime initially for possible secondary bacterial PNA, course completed.   - Has completed antibiotics except for meropenem which will end tomorrow. -He is having frequent diarrhea.  But appears to be due to tube feeds and not C. difficile.  Will watch closely.  Can thicken tube feeds.  4. Anemia: - hgb 8.7. Follow  5. FEN -As above.  We will second tube feeds.  5. Hypernatremia -Resolved. -Continue free water   6. AKI - Resolved.  Creatinine 0.45 today.  7. DTIs -  On both feet.  These are improving  8.  Hypokalemia. -K 4.0 stable  9.  Diffuse muscle weakness. -This is consistent with ICU/steroid/paralytics myopathy.  Continue passive range of motion exercises with PT.  He is improving slowly.  -PT and OT are following.  Both have recommended CIR.  Consult placed  10. HTN -Stable on current regimen.    Length of Stay: 45    Colton Tassin MD 10/19/2019, 11:45 AM  Advanced Heart Failure Team Pager [phone] (M-F; [place])  Please contact [facility] for night-coverage after hours (4p -7a ) and weekends on [email or web address]

## 2019-10-19 NOTE — Plan of Care (Signed)
  Problem: Education: Goal: Knowledge of risk factors and measures for prevention of condition will improve Outcome: Progressing   Problem: Coping: Goal: Psychosocial and spiritual needs will be supported Outcome: Progressing   Problem: Respiratory: Goal: Complications related to the disease process, condition or treatment will be avoided or minimized Outcome: Progressing   Problem: Respiratory: Goal: Ability to maintain a clear airway and adequate ventilation will improve Outcome: Progressing   Problem: Role Relationship: Goal: Method of communication will improve Outcome: Progressing   Problem: Education: Goal: Knowledge of General Education information will improve Description: Including pain rating scale, medication(s)/side effects and non-pharmacologic comfort measures Outcome: Progressing   Problem: Health Behavior/Discharge Planning: Goal: Ability to manage health-related needs will improve Outcome: Progressing   Problem: Clinical Measurements: Goal: Ability to maintain clinical measurements within normal limits will improve Outcome: Progressing Goal: Will remain free from infection Outcome: Progressing Goal: Diagnostic test results will improve Outcome: Progressing Goal: Respiratory complications will improve Outcome: Progressing Goal: Cardiovascular complication will be avoided Outcome: Progressing   Problem: Activity: Goal: Risk for activity intolerance will decrease Outcome: Progressing   Problem: Nutrition: Goal: Adequate nutrition will be maintained Outcome: Progressing   Problem: Coping: Goal: Level of anxiety will decrease Outcome: Progressing   Problem: Elimination: Goal: Will not experience complications related to bowel motility Outcome: Progressing Goal: Will not experience complications related to urinary retention Outcome: Progressing   Problem: Pain Managment: Goal: General experience of comfort will improve Outcome: Progressing    Problem: Safety: Goal: Ability to remain free from injury will improve Outcome: Progressing   Problem: Skin Integrity: Goal: Risk for impaired skin integrity will decrease Outcome: Progressing

## 2019-10-19 NOTE — Progress Notes (Signed)
NAME:  [name], MRN:  [id number], DOB:  1976/08/07, LOS: 59 ADMISSION DATE:  07/22/2019, CONSULTATION DATE:  12/24 REFERRING MD:  Sloan Leiter, CHIEF COMPLAINT:  Dyspnea   Brief History   44 y/o male admitted 12/14 with COVID pneumonia leading to ARDS, pneumomediastinum.  Intubated 12/26, bilateral chest tubes placed.  Past Medical History  GERD HTN Asthma  Significant Hospital Events   12/14 admit with hypoxemic respiratory failure in setting of COVID-19 pneumonia 12/23 noted to have pneumomediastinum on chest x-ray 12/24PCCM consulted for evaluation 12/26 worsening hypoxemia, intubated 12/27 VV fem/fem ECMO cannulation 12/29 tracheostomy >> See bedside nursing event notes for full rundown of procedures after 12/27 1/5 epistaxis, nasal packing 1/30 improved cxr, improved lung compliance, TV 350s on 15PC  1/31 CXR increased infiltrates, +CFB     2/2 Converted to right IJ Crescent cannula. 2/3 PEG tube inserted at bedside 2/14 30 min sweep trial> paO2 105 (18 PEEP, 60% FiO2) pH 7.22, pCO2 87 2/20 2 hours sweep trial 2/21 1.5 hour sweep trial 2/24 7-hour sweep trial.  Increasingly awake. 3/2 - decannulation attempt aborted due to increased hypercapnia when sedated for procedure 3/4- dcannulated 3/8 PEG tube repositioned by IR  Consults:  PCCM, CTS, CHF, Palliative  Procedures:  PICC 1/3 >> R radial a-line 1/20 >> R IJ ECMO cath 2/3 >> Tracheostomy 12/29 >> 10/14/2019 PEG tube replacement>>  Significant Diagnostic Tests:  CT chest 12/22>>extensive pneumomediastinum, multifocal patchy bilateral groundglass opacities CXR 1/25 >> improving bilateral infiltrates. ECMO cannulae in place. CXR 2/5 >> clearing bilateral infiltrates  Micro Data:  BCx2 12/14 >>negative  Sputum 12/15 >> OPF 12/28 blood > negative 12/29 > staph aureus 1/4 bronch:  1/2 acid fast smear bronch: negative 1/2 fungal cx bronch: negative1/12 BAL: serratia, staph epidermidis 1/4 BAL > MSSA,  Serratia 1/9 blood > negative 1/9 Resp culture > MSSA, serratia 1/11 serratia > negative 1/12 fungus culture > negative 1/12 BAL > serratia 1/18 BAL >> rare Serratia. 1/24 blood > negative 1/30 blood > negative 1/30 resp culture > serratia 2/1 BAL >> rare Serratia - enterococcus 2/1 aspergillus Ag > negative 2/2 resp culture > serratia  2/2 blood > negative 2/5 urine > negative 2/9 resp: serratia 2/15 BAL serratia 3/5 blood cultures ngtd  Antimicrobials:  Received remdesivir, steroids, actemra and convalescent plasma  See MAR for remainder  Interim history/subjective:   Upset this AM, had BM and no one has cleaned him up. Otherwise full vent support overnight.  Objective   Blood pressure 131/74, pulse 97, temperature 99.2 F (37.3 C), temperature source Oral, resp. rate 20, height _0  (1.727 m), weight 128.1 kg, SpO2 100 %.    Vent Mode: PCV FiO2 (%):  [35 %-40 %] 40 % Set Rate:  [20 bmp] 20 bmp PEEP:  [8 cmH20] 8 cmH20 Plateau Pressure:  [12 cmH20-21 cmH20] 12 cmH20   Intake/Output Summary (Last 24 hours) at 10/19/2019 0742 Last data filed at 10/19/2019 0600 Gross per 24 hour  Intake 2256.12 ml  Output 1480 ml  Net 776.12 ml   Filed Weights   10/17/19 0500 10/18/19 0348 10/19/19 0454  Weight: 127.8 kg 128.2 kg 128.1 kg    Examination: GEN: chronically ill appearing HEENT: MMM, trach in place, minimal secretions CV: RRR, ext warm PULM: Diminished bases, scattered rhonci GI: PEG in place, soft, +BS EXT: Trace global anasarca NEURO: Moves all 4 ext to command PSYCH: RASS 0  SKIN: Slightly pale  ABG    Component Value Date/Time  PHART 7.384 10/10/2019 1817   PCO2ART 63.7 (H) 10/10/2019 1817   PO2ART 76.0 (L) 10/10/2019 1817   HCO3 38.0 (H) 10/10/2019 1817   TCO2 40 (H) 10/10/2019 1817   ACIDBASEDEF 4.0 (H) 09/16/2019 2334   O2SAT 79.0 10/19/2019 0309     Resolved Hospital Problem list   Pneumothoraces-resolved.  Assessment & Plan:   ARDS  due to COVID 19, now requiring mechanical ventilation support at night Decannulated from VV ECMO on 3/4 after 65 days. Tracheostomy tube in place - TCT as tolerated  - PMV with SLP if possible   Dislodged PEG tube Indurated area of the skin right lateral PEG tube. - resolved   AKI with azotemia - improving  Positive cumulative fluid balance Hypernatremia has corrected Severe third spacing -diuresis prn   Hypertension - tapering oral antihypertensive - stable at this time   Serratia pneumonia> treated.   Neuromuscular weakness - OOB to chair as much as tolerated  - IPR consult/eval pending   Bilateral lower feet deep tissue injury Wound care continued for feet  Anemia due to critical illnes Conservative transfusion threshold  TF-induced diarrhea Metamucil PRN lomotil Okay for rectal tube if needed  Best practice:  Pain/Anxiety/Delirium protocol (if indicated): PRNs only VAP protocol (if indicated): Bundle in place. Respiratory support goals: Trach collar trial DVT prophylaxis: Lovenox GI prophylaxis: PPI Nutrition: TF Fluid status goals:  See above Urinary catheter: external urinary catheter Glucose control: controlled with current regimen Mobility/therapy needs: up with PT.  Daily labs: As needed Code Status: Full code  Disposition: ICU Family: updated when at bedside  Labs   CBC: CBC Latest Ref Rng & Units 10/19/2019 10/18/2019 10/17/2019  WBC 4.0 - 10.5 K/uL 5.1 5.2 5.1  Hemoglobin 13.0 - 17.0 g/dL 8.7(L) 8.6(L) 8.9(L)  Hematocrit 39.0 - 52.0 % 30.2(L) 30.2(L) 31.4(L)  Platelets 150 - 400 K/uL 243 244 227     Basic Metabolic Panel: BMP Latest Ref Rng & Units 10/19/2019 10/18/2019 10/17/2019  Glucose 70 - 99 mg/dL 79 86 97  BUN 6 - 20 mg/dL 41(H) 38(H) 36(H)  Creatinine 0.61 - 1.24 mg/dL 0.45(L) 0.46(L) 0.49(L)  Sodium 135 - 145 mmol/L 140 140 141  Potassium 3.5 - 5.1 mmol/L 4.0 4.3 3.9  Chloride 98 - 111 mmol/L 96(L) 95(L) 94(L)  CO2 22 - 32 mmol/L  36(H) 38(H) 38(H)  Calcium 8.9 - 10.3 mg/dL 8.4(L) 8.4(L) 8.6(L)     GFR: Estimated Creatinine Clearance: 152.2 mL/min (A) (by C-G formula based on SCr of 0.45 mg/dL (L)). Recent Labs  Lab 10/16/19 0415 10/17/19 0436 10/18/19 0313 10/19/19 0309  WBC 5.5 5.1 5.2 5.1    Liver Function Tests: No results for input(s): AST, ALT, ALKPHOS, BILITOT, PROT, ALBUMIN in the last 168 hours. No results for input(s): LIPASE, AMYLASE in the last 168 hours. No results for input(s): AMMONIA in the last 168 hours.  ABG    Component Value Date/Time   PHART 7.384 10/10/2019 1817   PCO2ART 63.7 (H) 10/10/2019 1817   PO2ART 76.0 (L) 10/10/2019 1817   HCO3 38.0 (H) 10/10/2019 1817   TCO2 40 (H) 10/10/2019 1817   ACIDBASEDEF 4.0 (H) 09/16/2019 2334   O2SAT 79.0 10/19/2019 0309     Coagulation Profile: No results for input(s): INR, PROTIME in the last 168 hours.  Cardiac Enzymes: No results for input(s): CKTOTAL, CKMB, CKMBINDEX, TROPONINI in the last 168 hours.  HbA1C: Hgb A1c MFr Bld  Date/Time Value Ref Range Status  09/23/2019 08:58 AM 5.5 4.8 -  5.6 % Final    Comment:    (NOTE) Pre diabetes:          5.7%-6.4% Diabetes:              >6.4% Glycemic control for   <7.0% adults with diabetes   07/24/2019 04:50 AM 6.7 (H) 4.8 - 5.6 % Final    Comment:    (NOTE) Pre diabetes:          5.7%-6.4% Diabetes:              >6.4% Glycemic control for   <7.0% adults with diabetes     CBG: Recent Labs  Lab 10/18/19 1119 10/18/19 1558 10/18/19 1954 10/18/19 2341 10/19/19 0444  GLUCAP 80 122* 145* [place], MD [facility] 10/19/2019 7:42 AM

## 2019-10-19 NOTE — Progress Notes (Signed)
Pt bathed and assisted to recliner via maxisky. Pt in very good spirits, listening to music, smiling, mouthing words to songs. Pt able to tolerate PMV only intermittently for max 30 seconds to express needs. Pt states its too hard and makes him tired. Passive ROM to all extremities and patient trying to squeeze ball with his hands. Pt able to video chat with wife for few minutes. Continue to monitor.Valinda Party

## 2019-10-19 NOTE — Progress Notes (Signed)
Assisted tele visit to patient with wife.  Kenlyn Lose P, RN  

## 2019-10-20 LAB — COOXEMETRY PANEL
Carboxyhemoglobin: 2.1 % — ABNORMAL HIGH (ref 0.5–1.5)
Methemoglobin: 0.4 % (ref 0.0–1.5)
O2 Saturation: 84.5 %
Total hemoglobin: 9 g/dL — ABNORMAL LOW (ref 12.0–16.0)

## 2019-10-20 LAB — GLUCOSE, CAPILLARY
Glucose-Capillary: 102 mg/dL — ABNORMAL HIGH (ref 70–99)
Glucose-Capillary: 103 mg/dL — ABNORMAL HIGH (ref 70–99)
Glucose-Capillary: 104 mg/dL — ABNORMAL HIGH (ref 70–99)
Glucose-Capillary: 106 mg/dL — ABNORMAL HIGH (ref 70–99)
Glucose-Capillary: 120 mg/dL — ABNORMAL HIGH (ref 70–99)
Glucose-Capillary: 77 mg/dL (ref 70–99)
Glucose-Capillary: 82 mg/dL (ref 70–99)
Glucose-Capillary: 95 mg/dL (ref 70–99)

## 2019-10-20 LAB — BASIC METABOLIC PANEL
Anion gap: 7 (ref 5–15)
BUN: 42 mg/dL — ABNORMAL HIGH (ref 6–20)
CO2: 35 mmol/L — ABNORMAL HIGH (ref 22–32)
Calcium: 8.9 mg/dL (ref 8.9–10.3)
Chloride: 100 mmol/L (ref 98–111)
Creatinine, Ser: 0.49 mg/dL — ABNORMAL LOW (ref 0.61–1.24)
GFR calc Af Amer: >60 mL/min (ref >60)
GFR calc non Af Amer: >60 mL/min (ref >60)
Glucose, Bld: 82 mg/dL (ref 70–99)
Potassium: 4.2 mmol/L (ref 3.5–5.1)
Sodium: 142 mmol/L (ref 135–145)

## 2019-10-20 LAB — CBC
HCT: 31.9 % — ABNORMAL LOW (ref 39.0–52.0)
Hemoglobin: 9.1 g/dL — ABNORMAL LOW (ref 13.0–17.0)
MCH: 27.6 pg (ref 26.0–34.0)
MCHC: 28.5 g/dL — ABNORMAL LOW (ref 30.0–36.0)
MCV: 96.7 fL (ref 80.0–100.0)
Platelets: 250 10*3/uL (ref 150–400)
RBC: 3.3 MIL/uL — ABNORMAL LOW (ref 4.22–5.81)
RDW: 15.9 % — ABNORMAL HIGH (ref 11.5–15.5)
WBC: 5.7 10*3/uL (ref 4.0–10.5)
nRBC: 0 % (ref 0.0–0.2)

## 2019-10-20 LAB — MAGNESIUM: Magnesium: 2.1 mg/dL (ref 1.7–2.4)

## 2019-10-20 LAB — PHOSPHORUS: Phosphorus: 2.8 mg/dL (ref 2.5–4.6)

## 2019-10-20 MED ORDER — ACETAZOLAMIDE 250 MG PO TABS
500.0000 mg | ORAL_TABLET | Freq: Two times a day (BID) | ORAL | Status: AC
  Administered 2019-10-20 (×2): 500 mg via ORAL
  Filled 2019-10-20 (×2): qty 2

## 2019-10-20 MED ORDER — FUROSEMIDE 10 MG/ML IJ SOLN
40.0000 mg | Freq: Once | INTRAMUSCULAR | Status: AC
  Administered 2019-10-20: 40 mg via INTRAVENOUS
  Filled 2019-10-20: qty 4

## 2019-10-20 MED ORDER — ACETAMINOPHEN 160 MG/5ML PO SOLN
650.0000 mg | Freq: Four times a day (QID) | ORAL | Status: DC
  Administered 2019-10-20 – 2019-10-29 (×35): 650 mg
  Filled 2019-10-20 (×35): qty 20.3

## 2019-10-20 MED ORDER — INSULIN DETEMIR 100 UNIT/ML ~~LOC~~ SOLN
6.0000 [IU] | Freq: Two times a day (BID) | SUBCUTANEOUS | Status: DC
  Administered 2019-10-20 (×2): 6 [IU] via SUBCUTANEOUS
  Filled 2019-10-20 (×4): qty 0.06

## 2019-10-20 MED ORDER — ENOXAPARIN SODIUM 60 MG/0.6ML ~~LOC~~ SOLN
60.0000 mg | SUBCUTANEOUS | Status: DC
  Administered 2019-10-21 – 2019-11-14 (×25): 60 mg via SUBCUTANEOUS
  Filled 2019-10-20 (×25): qty 0.6

## 2019-10-20 NOTE — Progress Notes (Signed)
NAME:  [name], MRN:  [id number], DOB:  25-Sep-1975, LOS: 66 ADMISSION DATE:  07/22/2019, CONSULTATION DATE:  12/24 REFERRING MD:  Sloan Leiter, CHIEF COMPLAINT:  Dyspnea   Brief History   44 y/o male admitted 12/14 with COVID pneumonia leading to ARDS, pneumomediastinum.  Intubated 12/26, bilateral chest tubes placed.  Past Medical History  GERD HTN Asthma  Significant Hospital Events   12/14 admit with hypoxemic respiratory failure in setting of COVID-19 pneumonia 12/23 noted to have pneumomediastinum on chest x-ray 12/24PCCM consulted for evaluation 12/26 worsening hypoxemia, intubated 12/27 VV fem/fem ECMO cannulation 12/29 tracheostomy >> See bedside nursing event notes for full rundown of procedures after 12/27 1/5 epistaxis, nasal packing 1/30 improved cxr, improved lung compliance, TV 350s on 15PC  1/31 CXR increased infiltrates, +CFB     2/2 Converted to right IJ Crescent cannula. 2/3 PEG tube inserted at bedside 2/14 30 min sweep trial> paO2 105 (18 PEEP, 60% FiO2) pH 7.22, pCO2 87 2/20 2 hours sweep trial 2/21 1.5 hour sweep trial 2/24 7-hour sweep trial.  Increasingly awake. 3/2 - decannulation attempt aborted due to increased hypercapnia when sedated for procedure 3/4- dcannulated 3/8 PEG tube repositioned by IR 3/9 >> trach wean  Consults:  PCCM, CTS, CHF, Palliative  Procedures:  PICC 1/3 >> R radial a-line 1/20 >> R IJ ECMO cath 2/3 >> Tracheostomy 12/29 >> 10/14/2019 PEG tube replacement>>  Significant Diagnostic Tests:  CT chest 12/22>>extensive pneumomediastinum, multifocal patchy bilateral groundglass opacities CXR 1/25 >> improving bilateral infiltrates. ECMO cannulae in place. CXR 2/5 >> clearing bilateral infiltrates  Micro Data:  BCx2 12/14 >>negative  Sputum 12/15 >> OPF 12/28 blood > negative 12/29 > staph aureus 1/4 bronch:  1/2 acid fast smear bronch: negative 1/2 fungal cx bronch: negative1/12 BAL: serratia, staph  epidermidis 1/4 BAL > MSSA, Serratia 1/9 blood > negative 1/9 Resp culture > MSSA, serratia 1/11 serratia > negative 1/12 fungus culture > negative 1/12 BAL > serratia 1/18 BAL >> rare Serratia. 1/24 blood > negative 1/30 blood > negative 1/30 resp culture > serratia 2/1 BAL >> rare Serratia - enterococcus 2/1 aspergillus Ag > negative 2/2 resp culture > serratia  2/2 blood > negative 2/5 urine > negative 2/9 resp: serratia 2/15 BAL serratia 3/5 blood cultures ngtd  Antimicrobials:  Received remdesivir, steroids, actemra and convalescent plasma  See MAR for remainder  Interim history/subjective:  Some pain in backside, otherwise doing well.  On full VS  Objective   Blood pressure (!) 142/82, pulse 98, temperature 98.8 F (37.1 C), temperature source Oral, resp. rate 17, height _0  (1.727 m), weight 128 kg, SpO2 100 %.    Vent Mode: PCV FiO2 (%):  [40 %] 40 % Set Rate:  [20 bmp] 20 bmp PEEP:  [8 cmH20] 8 cmH20 Plateau Pressure:  [10 cmH20] 10 cmH20   Intake/Output Summary (Last 24 hours) at 10/20/2019 0701 Last data filed at 10/20/2019 0600 Gross per 24 hour  Intake 3696.81 ml  Output 1200 ml  Net 2496.81 ml   Filed Weights   10/18/19 0348 10/19/19 0454 10/20/19 0400  Weight: 128.2 kg 128.1 kg 128 kg    Examination: GEN: chronically ill appearing HEENT: MMM, trach in place, minimal secretions CV: RRR, ext warm PULM: Diminished bases, scattered rhonci GI: PEG in place, soft, +BS EXT: global anasarca NEURO: Moves all 4 ext to command PSYCH: RASS 0  SKIN: Slightly pale  ABG    Component Value Date/Time   PHART 7.384 10/10/2019 1817  PCO2ART 63.7 (H) 10/10/2019 1817   PO2ART 76.0 (L) 10/10/2019 1817   HCO3 38.0 (H) 10/10/2019 1817   TCO2 40 (H) 10/10/2019 1817   ACIDBASEDEF 4.0 (H) 09/16/2019 2334   O2SAT 84.5 10/20/2019 0500     Resolved Hospital Problem list   Pneumothoraces-resolved.  Assessment & Plan:   ARDS due to COVID 19, now  requiring mechanical ventilation support at night Decannulated from VV ECMO on 3/4 after 65 days. Tracheostomy tube in place - TCT as tolerated : try overnight tonight - PMV with SLP if possible   Dislodged PEG tube Indurated area of the skin right lateral PEG tube. - resolved   AKI with azotemia - improving  Positive cumulative fluid balance Hypernatremia has corrected Severe third spacing -diuresis prn  - couple doses diamox today to help with contraction alkalosis  Hypertension - tapering oral antihypertensive - stable at this time   Serratia pneumonia> treated.   Neuromuscular weakness - OOB to chair as much as tolerated  - IPR consult/eval pending   Bilateral lower feet deep tissue injury Wound care continued for feet  Anemia due to critical illnes Conservative transfusion threshold  TF-induced diarrhea Metamucil PRN lomotil Okay for rectal tube if needed  Best practice:  Pain/Anxiety/Delirium protocol (if indicated): PRNs only VAP protocol (if indicated): Bundle in place. Respiratory support goals: Trach collar trial DVT prophylaxis: Lovenox GI prophylaxis: PPI Nutrition: TF Fluid status goals:  See above Urinary catheter: external urinary catheter Glucose control: drop levemir from 10 units to 6 units BID Mobility/therapy needs: up with PT.  Daily labs: As needed Code Status: Full code  Disposition: IPR once weaned from vent Family: updated when at bedside  Labs   CBC: CBC Latest Ref Rng & Units 10/20/2019 10/19/2019 10/18/2019  WBC 4.0 - 10.5 K/uL 5.7 5.1 5.2  Hemoglobin 13.0 - 17.0 g/dL 9.1(L) 8.7(L) 8.6(L)  Hematocrit 39.0 - 52.0 % 31.9(L) 30.2(L) 30.2(L)  Platelets 150 - 400 K/uL 250 243 244     Basic Metabolic Panel: BMP Latest Ref Rng & Units 10/20/2019 10/19/2019 10/18/2019  Glucose 70 - 99 mg/dL 82 79 86  BUN 6 - 20 mg/dL 42(H) 41(H) 38(H)  Creatinine 0.61 - 1.24 mg/dL 0.49(L) 0.45(L) 0.46(L)  Sodium 135 - 145 mmol/L 142 140 140   Potassium 3.5 - 5.1 mmol/L 4.2 4.0 4.3  Chloride 98 - 111 mmol/L 100 96(L) 95(L)  CO2 22 - 32 mmol/L 35(H) 36(H) 38(H)  Calcium 8.9 - 10.3 mg/dL 8.9 8.4(L) 8.4(L)     GFR: Estimated Creatinine Clearance: 152.1 mL/min (A) (by C-G formula based on SCr of 0.49 mg/dL (L)). Recent Labs  Lab 10/17/19 0436 10/18/19 0313 10/19/19 0309 10/20/19 0401  WBC 5.1 5.2 5.1 5.7    Liver Function Tests: No results for input(s): AST, ALT, ALKPHOS, BILITOT, PROT, ALBUMIN in the last 168 hours. No results for input(s): LIPASE, AMYLASE in the last 168 hours. No results for input(s): AMMONIA in the last 168 hours.  ABG    Component Value Date/Time   PHART 7.384 10/10/2019 1817   PCO2ART 63.7 (H) 10/10/2019 1817   PO2ART 76.0 (L) 10/10/2019 1817   HCO3 38.0 (H) 10/10/2019 1817   TCO2 40 (H) 10/10/2019 1817   ACIDBASEDEF 4.0 (H) 09/16/2019 2334   O2SAT 84.5 10/20/2019 0500     Coagulation Profile: No results for input(s): INR, PROTIME in the last 168 hours.  Cardiac Enzymes: No results for input(s): CKTOTAL, CKMB, CKMBINDEX, TROPONINI in the last 168 hours.  HbA1C: Hgb  A1c MFr Bld  Date/Time Value Ref Range Status  09/23/2019 08:58 AM 5.5 4.8 - 5.6 % Final    Comment:    (NOTE) Pre diabetes:          5.7%-6.4% Diabetes:              >6.4% Glycemic control for   <7.0% adults with diabetes   07/24/2019 04:50 AM 6.7 (H) 4.8 - 5.6 % Final    Comment:    (NOTE) Pre diabetes:          5.7%-6.4% Diabetes:              >6.4% Glycemic control for   <7.0% adults with diabetes     CBG: Recent Labs  Lab 10/19/19 1202 10/19/19 1627 10/19/19 2029 10/20/19 0024 10/20/19 0431  GLUCAP 123* 84 [place], MD [facility] 10/20/2019 7:01 AM

## 2019-10-20 NOTE — Progress Notes (Signed)
Patient ID: [name], male   DOB: 02-May-1976, 44 y.o.   MRN: [id number]    Advanced Heart Failure Rounding Note   Subjective:    Was on vent overnight. Now back on TC. Having significant clear/frothy secretions. Able to talk with [facility] valve.  Tmax 99.2  Arms and legs remain weak. Loose stools improving.    Objective:   Weight Range:  Vital Signs:   Temp:  [98.6 F (37 C)-99.2 F (37.3 C)] 99.2 F (37.3 C) (03/14 1200) Pulse Rate:  [85-112] 98 (03/14 1159) Resp:  [15-28] 20 (03/14 1159) BP: (117-164)/(64-86) 164/86 (03/14 1100) SpO2:  [87 %-100 %] 100 % (03/14 1159) FiO2 (%):  [40 %] 40 % (03/14 1159) Weight:  [128 kg] 128 kg (03/14 0400) Last BM Date: 10/20/19  Weight change: Filed Weights   10/18/19 0348 10/19/19 0454 10/20/19 0400  Weight: 128.2 kg 128.1 kg 128 kg    Intake/Output:   Intake/Output Summary (Last 24 hours) at 10/20/2019 1306 Last data filed at 10/20/2019 1100 Gross per 24 hour  Intake 2571.06 ml  Output 1700 ml  Net 871.06 ml     Physical Exam : General:  Sitting up in bed on TC with some secretions HEENT: normal Neck: supple. no JVD. + Trach Carotids 2+ bilat; no bruits. No lymphadenopathy or thryomegaly appreciated. Cor: PMI nondisplaced. Regular rate & rhythm. No rubs, gallops or murmurs. Lungs: clear Abdomen: obese soft, nontender, nondistended. No hepatosplenomegaly. No bruits or masses. Good bowel sounds. Extremities: no cyanosis, clubbing, rash, 2+ edema + feet DTIs Neuro: alert & orientedx3, cranial nerves grossly intact. Arms and legs weak.     Telemetry: Sinus 90-100  Personally reviewed   Labs: Basic Metabolic Panel: Recent Labs  Lab 10/16/19 0415 10/16/19 0415 10/17/19 0436 10/17/19 0436 10/18/19 0313 10/19/19 0309 10/20/19 0401  NA 141  --  141  --  140 140 142  K 3.9  --  3.9  --  4.3 4.0 4.2  CL 92*  --  94*  --  95* 96* 100  CO2 38*  --  38*  --  38* 36* 35*  GLUCOSE 115*  --  97  --  86 79 82   BUN 45*  --  36*  --  38* 41* 42*  CREATININE 0.54*  --  0.49*  --  0.46* 0.45* 0.49*  CALCIUM 8.7*   < > 8.6*   < > 8.4* 8.4* 8.9  MG 2.1  --  2.0  --  2.0 2.0 2.1  PHOS 3.0  --  2.7  --  2.2* 2.4* 2.8   < > = values in this interval not displayed.    Liver Function Tests: No results for input(s): AST, ALT, ALKPHOS, BILITOT, PROT, ALBUMIN in the last 168 hours. No results for input(s): LIPASE, AMYLASE in the last 168 hours. No results for input(s): AMMONIA in the last 168 hours.  CBC: Recent Labs  Lab 10/16/19 0415 10/17/19 0436 10/18/19 0313 10/19/19 0309 10/20/19 0401  WBC 5.5 5.1 5.2 5.1 5.7  HGB 8.8* 8.9* 8.6* 8.7* 9.1*  HCT 30.6* 31.4* 30.2* 30.2* 31.9*  MCV 96.5 97.2 96.5 95.6 96.7  PLT 231 227 244 243 250    Cardiac Enzymes: No results for input(s): CKTOTAL, CKMB, CKMBINDEX, TROPONINI in the last 168 hours.  BNP: BNP (last 3 results) Recent Labs    07/22/19 1039  BNP 15.3    ProBNP (last 3 results) No results for input(s): PROBNP in the  last 8760 hours.    Other results:  Imaging: No results found.   Medications:     Scheduled Medications: . acetaminophen (TYLENOL) oral liquid 160 mg/5 mL  650 mg Per Tube Q6H  . acetaZOLAMIDE  500 mg Oral BID  . amLODipine  5 mg Per Tube Daily  . vitamin C  500 mg Per Tube Daily  . chlorhexidine gluconate (MEDLINE KIT)  15 mL Mouth Rinse BID  . Chlorhexidine Gluconate Cloth  6 each Topical Daily  . cloNIDine  0.1 mg Per Tube BID  . diatrizoate meglumine-sodium  30 mL Oral Once  . diclofenac Sodium  2 g Topical QID  . enoxaparin (LOVENOX) injection  60 mg Subcutaneous Q24H  . feeding supplement (OSMOLITE 1.5 CAL)  237 mL Per Tube 6 X Daily  . feeding supplement (PRO-STAT SUGAR FREE 64)  60 mL Per Tube QID  . free water  150 mL Per Tube Q6H  . Gerhardt's butt cream   Topical QID  . insulin aspart  0-20 Units Subcutaneous Q4H  . insulin detemir  6 Units Subcutaneous BID  . ipratropium-albuterol  3 mL  Nebulization TID  . magic mouthwash  5 mL Oral TID  . mouth rinse  15 mL Mouth Rinse 10 times per day  . montelukast  10 mg Per Tube QHS  . nutrition supplement (JUVEN)  1 packet Per Tube BID BM  . pantoprazole (PROTONIX) IV  40 mg Intravenous BID  . psyllium  1 packet Per Tube BID  . QUEtiapine  25 mg Per Tube QHS  . sodium chloride flush  10-40 mL Intracatheter Q12H    Infusions: . sodium chloride 250 mL (10/08/19 1626)  . sodium chloride Stopped (10/13/19 0548)  . dexmedetomidine (PRECEDEX) IV infusion 0.2 mcg/kg/hr (10/20/19 1103)  . meropenem (MERREM) IV Stopped (10/20/19 0848)    PRN Medications: sodium chloride, sodium chloride, acetaminophen (TYLENOL) oral liquid 160 mg/5 mL, acetaminophen, albuterol, [COMPLETED] diazepam **FOLLOWED BY** diazepam, diphenoxylate-atropine, hydrALAZINE, HYDROmorphone, magnesium hydroxide, midazolam, ondansetron (ZOFRAN) IV, senna-docusate, sodium chloride, sodium chloride flush   Assessment/Plan:    1. Acute hypoxic/hypercarbic respiratory failure due to COVID PNA: Remained markedly hypoxic despite full support. Intubated 12/26. S/p bilateral CTs 12/26 for pneumothoraces.  TEE on 12/26 LVEF 70% RV ok. VV ECMO begun 12/26. Tracheostomy 12/29.  COVID ARDS at this point. ECMO circuit changed 1/8 & 1/23. Crescent cannula placed 2/2 (femoral cannuals removed) - Decannulated 3/4 - Doing well with trach collar trials.  Resting on ventilator at night.  D/w CCM. Will try to keep on TC tonight. Repeat CXR in am  - Will give one dose IV lasix today  2. COVID PNA: management of COVID infection per CCM. CXR with bilateral multifocal PNA progressing to likely ARDS.  - s/p 10 days of remdesivir - 12/16, 12/2 s/p tociluzimab - 12/17 s/p convalescent plasma - Decadron at 28m/daily completed 1/11  3. ID: Empiric coverage with vancomycin/cefepime initially for possible secondary bacterial PNA, course completed.   - Completes meropenem today,  - Diarrhea  improving  4. Anemia: - hgb 9.1. Follow  5. FEN - Remains on TFs  5. Hypernatremia -Resolved. -Continue free water   6. AKI - Resolved.  Creatinine 0.49 today.  7. DTIs - On both feet.  These are improving  8.  Hypokalemia. -K 4.2 stable  9.  Diffuse muscle weakness. -This is consistent with ICU/steroid/paralytics myopathy.  Continue passive range of motion exercises with PT.  He is improving slowly.  -PT and OT  are following.  Both have recommended CIR.  Consult placed. I will f/u in am   10. HTN -Blood pressure well controlled. Continue current regimen.    Length of Stay: 71    Glori Bickers MD 10/20/2019, 1:06 PM  Advanced Heart Failure Team Pager [phone] (M-F; [place])  Please contact [facility] for night-coverage after hours (4p -7a ) and weekends on [email or web address]

## 2019-10-20 NOTE — Plan of Care (Signed)
  Problem: Activity: Goal: Risk for activity intolerance will decrease Outcome: Progressing   Problem: Nutrition: Goal: Adequate nutrition will be maintained Outcome: Progressing   Problem: Coping: Goal: Level of anxiety will decrease Outcome: Progressing   

## 2019-10-21 ENCOUNTER — Telehealth: Payer: Self-pay | Admitting: Allergy & Immunology

## 2019-10-21 ENCOUNTER — Inpatient Hospital Stay (HOSPITAL_COMMUNITY): Payer: Managed Care, Other (non HMO)

## 2019-10-21 DIAGNOSIS — G7281 Critical illness myopathy: Secondary | ICD-10-CM

## 2019-10-21 LAB — BASIC METABOLIC PANEL
Anion gap: 10 (ref 5–15)
BUN: 38 mg/dL — ABNORMAL HIGH (ref 6–20)
CO2: 31 mmol/L (ref 22–32)
Calcium: 8.9 mg/dL (ref 8.9–10.3)
Chloride: 101 mmol/L (ref 98–111)
Creatinine, Ser: 0.33 mg/dL — ABNORMAL LOW (ref 0.61–1.24)
GFR calc Af Amer: >60 mL/min (ref >60)
GFR calc non Af Amer: >60 mL/min (ref >60)
Glucose, Bld: 92 mg/dL (ref 70–99)
Potassium: 3.8 mmol/L (ref 3.5–5.1)
Sodium: 142 mmol/L (ref 135–145)

## 2019-10-21 LAB — CBC
HCT: 35 % — ABNORMAL LOW (ref 39.0–52.0)
Hemoglobin: 9.9 g/dL — ABNORMAL LOW (ref 13.0–17.0)
MCH: 27.4 pg (ref 26.0–34.0)
MCHC: 28.3 g/dL — ABNORMAL LOW (ref 30.0–36.0)
MCV: 97 fL (ref 80.0–100.0)
Platelets: 249 10*3/uL (ref 150–400)
RBC: 3.61 MIL/uL — ABNORMAL LOW (ref 4.22–5.81)
RDW: 15.9 % — ABNORMAL HIGH (ref 11.5–15.5)
WBC: 6 10*3/uL (ref 4.0–10.5)
nRBC: 0 % (ref 0.0–0.2)

## 2019-10-21 LAB — GLUCOSE, CAPILLARY
Glucose-Capillary: 100 mg/dL — ABNORMAL HIGH (ref 70–99)
Glucose-Capillary: 102 mg/dL — ABNORMAL HIGH (ref 70–99)
Glucose-Capillary: 123 mg/dL — ABNORMAL HIGH (ref 70–99)
Glucose-Capillary: 132 mg/dL — ABNORMAL HIGH (ref 70–99)
Glucose-Capillary: 81 mg/dL (ref 70–99)
Glucose-Capillary: 98 mg/dL (ref 70–99)

## 2019-10-21 LAB — MAGNESIUM: Magnesium: 1.9 mg/dL (ref 1.7–2.4)

## 2019-10-21 LAB — PHOSPHORUS: Phosphorus: 3.2 mg/dL (ref 2.5–4.6)

## 2019-10-21 MED ORDER — CHLORHEXIDINE GLUCONATE 0.12 % MT SOLN
OROMUCOSAL | Status: AC
  Filled 2019-10-21: qty 15

## 2019-10-21 MED ORDER — CARVEDILOL 6.25 MG PO TABS
6.2500 mg | ORAL_TABLET | Freq: Two times a day (BID) | ORAL | Status: DC
  Administered 2019-10-21 – 2019-10-23 (×5): 6.25 mg via ORAL
  Filled 2019-10-21 (×5): qty 1

## 2019-10-21 MED ORDER — FUROSEMIDE 10 MG/ML IJ SOLN
40.0000 mg | Freq: Two times a day (BID) | INTRAMUSCULAR | Status: AC
  Administered 2019-10-21 (×2): 40 mg via INTRAVENOUS
  Filled 2019-10-21 (×2): qty 4

## 2019-10-21 MED ORDER — POTASSIUM CHLORIDE 20 MEQ/15ML (10%) PO SOLN
20.0000 meq | ORAL | Status: AC
  Administered 2019-10-21 (×4): 20 meq via ORAL
  Filled 2019-10-21 (×4): qty 15

## 2019-10-21 NOTE — Consult Note (Signed)
Physical Medicine and Rehabilitation Consult Reason for Consult: Decreased functional mobility Referring Physician: Dr. Haroldine Laws   HPI: [name] is a 44 y.o. right-handed male with history of asthma, hypertension, pancreatitis.  Presented 07/22/2019 with Covid pneumonia causing acute hypoxic respiratory failure.  Developed pneumomediastinum 1223 with concern for possible bilateral small pneumothoraces 08/01/2019.  Patient initially placed on a 10-day course of Decadron as well as remdesivir and tociluzimab.  Patient required intubation and later underwent bilateral chest tube placements.  TEE 08/03/2019 ejection fraction 70%.  Long-term intubation requiring tracheostomy 08/06/2019 as well as PEG tube placement 09/11/2019..  Maintained on broad-spectrum antibiotics.  Hospital course complicated by diffuse muscle weakness suspect myopathy with follow-up per neurology.  Follow-up per critical care medicine pulmonary services trach collar trials off vent currently with a #8 XLT trach.  Wound care nurse follow-up for bilateral lateral feet pressure injuries with wound care as directed.  Acute on chronic anemia 9.9 and monitored.  Therapy evaluations completed with recommendations of physical medicine rehab consult.  The patient is able to talk with the Passy-Muir valve.  He did not experience any respiratory distress. Patient is mildly lethargic, he denied any breathing issues today but did complain of knee pain.  Review of Systems  Unable to perform ROS: Acuity of condition  Constitutional: Negative.   HENT: Negative for ear pain and nosebleeds.   Eyes: Negative for discharge and redness.  Respiratory: Positive for cough and sputum production. Negative for hemoptysis, shortness of breath and stridor.   Cardiovascular: Negative for chest pain and leg swelling.  Gastrointestinal: Negative for abdominal pain, nausea and vomiting.  Genitourinary: Negative for frequency and hematuria.    Musculoskeletal: Positive for joint pain. Negative for back pain.  Skin: Negative.   Neurological: Positive for sensory change, focal weakness and weakness.  Psychiatric/Behavioral: Negative for hallucinations.   Past Medical History:  Diagnosis Date  . Asthma   . GERD (gastroesophageal reflux disease)   . Headache   . History of kidney stones    LEFT URETERAL STONE  . HTN (hypertension)   . Pancreatitis 2018   GALLBALDDER SLUDGE CAUSED ISSUED RESOLVED   Past Surgical History:  Procedure Laterality Date  . CANNULATION FOR ECMO (EXTRACORPOREAL MEMBRANE OXYGENATION) N/A 08/28/2019   Procedure: CANNULATION FOR VV ECMO (EXTRACORPOREAL MEMBRANE OXYGENATION);  Surgeon: Prescott Gum, Collier Salina, MD;  Location: [place];  Service: Open Heart Surgery;  Laterality: N/A;  CRESCENT CANNULA  . CANNULATION FOR ECMO (EXTRACORPOREAL MEMBRANE OXYGENATION) N/A 09/10/2019   Procedure: CANNULATION FOR ECMO (EXTRACORPOREAL MEMBRANE OXYGENATION) PUTTING IN CRESCENT 32FR CANNULA  AND REMOVING GROING CANNULATION;  Surgeon: Wonda Olds, MD;  Location: [place];  Service: Open Heart Surgery;  Laterality: N/A;  PUTTING IN CRESCENT/REMOVING GROIN CANNULATION  . CYSTOSCOPY/URETEROSCOPY/HOLMIUM LASER/STENT PLACEMENT Left 04/18/2019   Procedure: LEFT URETEROSCOPY/HOLMIUM LASER/STENT PLACEMENT;  Surgeon: Ardis Hughs, MD;  Location: [facility];  Service: Urology;  Laterality: Left;  . CYSTOSCOPY/URETEROSCOPY/HOLMIUM LASER/STENT PLACEMENT Left 05/02/2019   Procedure: CYSTOSCOPY/URETEROSCOPY/HOLMIUM LASER/STENT EXCHANGE;  Surgeon: Ardis Hughs, MD;  Location: [facility];  Service: Urology;  Laterality: Left;  . ECMO CANNULATION N/A 08/03/2019   Procedure: ECMO CANNULATION;  Surgeon: Wonda Olds, MD;  Location: [place] [facility];  Service: Cardiovascular;  Laterality: N/A;  . ESOPHAGOGASTRODUODENOSCOPY N/A 09/11/2019   Procedure: ESOPHAGOGASTRODUODENOSCOPY (EGD);  Surgeon: Wonda Olds, MD;   Location: [place];  Service: Thoracic;  Laterality: N/A;  . IR [place] Vivianne Master  10/14/2019  .  IRRIGATION AND DEBRIDEMENT SHOULDER Right 09/29/2017   Procedure: IRRIGATION AND DEBRIDEMENT SHOULDER;  Surgeon: Leandrew Koyanagi, MD;  Location: [place];  Service: Orthopedics;  Laterality: Right;  . [place] SURGERY  2002  . NASAL ENDOSCOPY WITH EPISTAXIS CONTROL N/A 08/31/2019   Procedure: NASAL ENDOSCOPY WITH EPISTAXIS CONTROL WITH CAUTERIZATION;  Surgeon: Melida Quitter, MD;  Location: [place];  Service: ENT;  Laterality: N/A;  . PORTACATH PLACEMENT N/A 09/11/2019   Procedure: PEG TUBE INSERTION - BEDSIDE;  Surgeon: Wonda Olds, MD;  Location: [place];  Service: Thoracic;  Laterality: N/A;  . TEE WITHOUT CARDIOVERSION N/A 08/28/2019   Procedure: TRANSESOPHAGEAL ECHOCARDIOGRAM (TEE);  Surgeon: Prescott Gum, Collier Salina, MD;  Location: [place];  Service: Open Heart Surgery;  Laterality: N/A;  . TEE WITHOUT CARDIOVERSION N/A 09/10/2019   Procedure: TRANSESOPHAGEAL ECHOCARDIOGRAM (TEE);  Surgeon: Wonda Olds, MD;  Location: [place];  Service: Open Heart Surgery;  Laterality: N/A;   Family History  Problem Relation Age of Onset  . Asthma Brother   . Diabetes Father   . Hypertension Father   . Diabetes Paternal Grandmother   . Hypertension Paternal Grandmother   . Migraines Mother   . GER disease Mother   . Pancreatic cancer Maternal Grandmother   . Heart disease Maternal Grandfather    Social History:  reports that he has never smoked. He has never used smokeless tobacco. He reports current alcohol use. He reports that he does not use drugs. Allergies: No Known Allergies Medications Prior to Admission  Medication Sig Dispense Refill  . Albuterol Sulfate (PROAIR RESPICLICK) 366 (90 Base) MCG/ACT AEPB Inhale 2 puffs into the lungs every 4 (four) hours as needed (Shortness of breath). 1 each 1  . cetirizine (ZYRTEC) 10 MG tablet Take 1 tablet (10 mg total) by mouth daily. 30 tablet 5  .  lisinopril-hydrochlorothiazide (ZESTORETIC) 10-12.5 MG tablet Take 1 tablet by mouth daily.    Marland Kitchen omeprazole (PRILOSEC) 20 MG capsule Take 40 mg by mouth daily.     Marland Kitchen triamcinolone (NASACORT ALLERGY 24HR) 55 MCG/ACT AERO nasal inhaler Place 2 sprays into the nose daily. (Patient taking differently: Place 2 sprays into the nose daily as needed (allergies). ) 1 Inhaler 5    Home: Home Living Family/patient expects to be discharged to:: Private residence Living Arrangements: Spouse/significant other Available Help at Discharge: Family Type of Home: House Home Access: Stairs to enter Technical brewer of Steps: 4 Entrance Stairs-Rails: Right, Left Home Layout: One level Bathroom Shower/Tub: Multimedia programmer: Standard Home Equipment: None Additional Comments: history obtained from chart as pt paralyzed and sedation being weaned  Functional History: Prior Function Level of Independence: Independent Comments: Pt independent in ADLs, IADLs, and mobility. Pt drives and works at [employer]. Pt does not use oxygen at home. Functional Status:  Mobility: Bed Mobility Overal bed mobility: Needs Assistance Bed Mobility: Rolling Rolling: Total assist Supine to sit: Max assist, +2 for physical assistance, +2 for safety/equipment Sit to supine: Max assist, +2 for physical assistance, +2 for safety/equipment General bed mobility comments: total to roll for placement of sling, minimal use of BUEs due to decreased strength Transfers Overall transfer level: Needs assistance Equipment used: Ambulation equipment used Transfer via Lift Equipment: [employer] Transfers: Sit to/from Stand Sit to Stand: Supervision Stand pivot transfers: Min guard General transfer comment: transferred pt to chair with maxisky Ambulation/Gait General Gait Details: did not attempt ambulation yet    ADL: ADL Overall ADL's : Needs assistance/impaired Eating/Feeding:  NPO Grooming: Maximal  assistance, Wash/dry face, Oral care Grooming Details (indicate cue type and reason): assist for grip strength, maintaining arms up against gravity Upper Body Bathing: Supervision/ safety, Set up, Sitting Lower Body Bathing: Supervison/ safety, Set up, Sit to/from stand, Sitting/lateral leans Upper Body Dressing : Supervision/safety, Set up, Sitting Lower Body Dressing: Supervision/safety, Set up, Sitting/lateral leans, Sit to/from stand(use of figure four) Toilet Transfer: Min guard, BSC Toileting- Clothing Manipulation and Hygiene: Min guard, Sit to/from stand, Sitting/lateral lean Functional mobility during ADLs: Min guard General ADL Comments: session focused on postural control and therapeutic exercise  Cognition: Cognition Overall Cognitive Status: Difficult to assess Orientation Level: Oriented X4 Cognition Arousal/Alertness: Awake/alert Behavior During Therapy: WFL for tasks assessed/performed Overall Cognitive Status: Difficult to assess Area of Impairment: Problem solving, Following commands Current Attention Level: Selective Following Commands: Follows one step commands consistently Safety/Judgement: Decreased awareness of safety, Decreased awareness of deficits Awareness: Emergent Problem Solving: Slow processing General Comments: following one step commands consistently, talking some with PMV, smiling appropriately at jokes. Difficult to assess due to: Tracheostomy  Blood pressure (!) 137/95, pulse 97, temperature 98.1 F (36.7 C), temperature source Oral, resp. rate (!) 21, height 5' 8"  (1.727 m), weight 128 kg, SpO2 94 %. Physical Exam  Neurological:  Patient resting comfortably.  He does make eye contact with examiner.  He does follow some basic commands.  Overall examination limited due to tracheostomy tube.    General: No acute distress Mood and affect are appropriate Heart: Regular rate and rhythm no rubs murmurs or extra sounds Lungs: Clear to auscultation,  breathing unlabored, no rales or wheezes Abdomen: Positive bowel sounds, soft nontender to palpation, nondistended Extremities: No clubbing, cyanosis, or edema Skin: No evidence of breakdown, no evidence of rash Neurologic: Cranial nerves II through XII intact, motor strength is trace bilateral deltoid, 0 bilateral bicep trace bilateral tricep to minus finger flexion bilaterally 0 finger extension bilaterally 0 hip flexion or knee extension bilaterally trace hip extension bilaterally 0 ankle dorsiflexion plantarflexion bilaterally sensory exam normal sensation to light touch and proprioception in bilateral upper and lower extremities Cerebellar exam normal finger to nose to finger as well as heel to shin in bilateral upper and lower extremities Musculoskeletal: Full range of motion in all 4 extremities. No joint swelling Results for orders placed or performed during the hospital encounter of 07/22/19 (from the past 24 hour(s))  Glucose, capillary     Status: Abnormal   Collection Time: 10/20/19 11:41 AM  Result Value Ref Range   Glucose-Capillary 102 (H) 70 - 99 mg/dL  Glucose, capillary     Status: Abnormal   Collection Time: 10/20/19  4:35 PM  Result Value Ref Range   Glucose-Capillary 106 (H) 70 - 99 mg/dL  Glucose, capillary     Status: Abnormal   Collection Time: 10/20/19  5:07 PM  Result Value Ref Range   Glucose-Capillary 120 (H) 70 - 99 mg/dL  Glucose, capillary     Status: None   Collection Time: 10/20/19  8:01 PM  Result Value Ref Range   Glucose-Capillary 82 70 - 99 mg/dL  Glucose, capillary     Status: Abnormal   Collection Time: 10/20/19 11:44 PM  Result Value Ref Range   Glucose-Capillary 104 (H) 70 - 99 mg/dL  Magnesium     Status: None   Collection Time: 10/21/19  4:40 AM  Result Value Ref Range   Magnesium 1.9 1.7 - 2.4 mg/dL  Phosphorus     Status: None  Collection Time: 10/21/19  4:40 AM  Result Value Ref Range   Phosphorus 3.2 2.5 - 4.6 mg/dL  Basic metabolic  panel     Status: Abnormal   Collection Time: 10/21/19  4:40 AM  Result Value Ref Range   Sodium 142 135 - 145 mmol/L   Potassium 3.8 3.5 - 5.1 mmol/L   Chloride 101 98 - 111 mmol/L   CO2 31 22 - 32 mmol/L   Glucose, Bld 92 70 - 99 mg/dL   BUN 38 (H) 6 - 20 mg/dL   Creatinine, Ser 0.33 (L) 0.61 - 1.24 mg/dL   Calcium 8.9 8.9 - 10.3 mg/dL   GFR calc non Af Amer >60 >60 mL/min   GFR calc Af Amer >60 >60 mL/min   Anion gap 10 5 - 15  CBC     Status: Abnormal   Collection Time: 10/21/19  4:40 AM  Result Value Ref Range   WBC 6.0 4.0 - 10.5 K/uL   RBC 3.61 (L) 4.22 - 5.81 MIL/uL   Hemoglobin 9.9 (L) 13.0 - 17.0 g/dL   HCT 35.0 (L) 39.0 - 52.0 %   MCV 97.0 80.0 - 100.0 fL   MCH 27.4 26.0 - 34.0 pg   MCHC 28.3 (L) 30.0 - 36.0 g/dL   RDW 15.9 (H) 11.5 - 15.5 %   Platelets 249 150 - 400 K/uL   nRBC 0.0 0.0 - 0.2 %  Glucose, capillary     Status: None   Collection Time: 10/21/19  4:42 AM  Result Value Ref Range   Glucose-Capillary 81 70 - 99 mg/dL  Glucose, capillary     Status: Abnormal   Collection Time: 10/21/19  7:36 AM  Result Value Ref Range   Glucose-Capillary 123 (H) 70 - 99 mg/dL   DG CHEST PORT 1 VIEW  Result Date: 10/21/2019 CLINICAL DATA:  Tracheostomy, pneumonia, respiratory distress, asthma, hypertension EXAM: PORTABLE CHEST 1 VIEW COMPARISON:  Portable exam 0510 hours compared to 10/16/2019 FINDINGS: Tip of tracheostomy tube projects 3.7 cm above carina. RIGHT arm PICC line tip projects over SVC. Enlargement of cardiac silhouette. Patchy BILATERAL pulmonary infiltrates greater in RIGHT upper lobe and LEFT base. No definite pleural effusion or pneumothorax. IMPRESSION: Persistent BILATERAL pulmonary infiltrates consistent with multifocal pneumonia, slightly increased in RIGHT upper lobe since previous study. Electronically Signed   By: Lavonia Dana M.D.   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 Does the  need for close, 24 hr/day medical supervision in concert with the patient's rehab needs make it unreasonable for this patient to be served in a less intensive setting? Potentially 2. Co-Morbidities requiring supervision/potential complications: History of asthma, morbid obesity, hypertension, chronic respiratory failure, trach dependent, dysphagia n.p.o. 3. Due to bladder management, bowel management, skin/wound care, disease management, medication administration, pain management and patient education, does the patient require 24 hr/day rehab nursing? Potentially 4. Does the patient require coordinated care of a physician, rehab nurse, therapy disciplines of PT, OT, speech to address physical and functional deficits in the context of the above medical diagnosis(es)? Yes Addressing deficits in the following areas: balance, endurance, locomotion, strength, transferring, bowel/bladder control, bathing, dressing, feeding, grooming, toileting and psychosocial support 5. Can the patient actively participate in an intensive therapy program of at least 3 hrs of therapy per day at least 5 days per week? No 6. The potential for patient to make measurable gains while on inpatient rehab is Currently poor in terms  of making weekly gains 7. Anticipated functional outcomes upon discharge from inpatient rehab are n/a  with PT, n/a with OT, n/a with SLP. 8. Estimated rehab length of stay to reach the above functional goals is: See below 9. Anticipated discharge destination: Other 10. Overall Rehab/Functional Prognosis: fair  RECOMMENDATIONS: This patient's condition is appropriate for continued rehabilitative care in the following setting: LTACH Patient has agreed to participate in recommended program. Potentially Note that insurance prior authorization may be required for reimbursement for recommended care.  Comment: Patient with severe weakness in all 4 limbs minimal movement, anticipate a very prolonged recovery  lasting 6 months or more.  Has limited social support outside of hospital.  Would recommend that the patient starts in a lower level of rehab intensity such as LTAC and if patient makes progress then transfer to rehab.   Lavon Paganini Angiulli, PA-C 10/21/2019   "I have personally performed a face to face diagnostic evaluation of this patient.  Additionally, I have reviewed and concur with the physician assistant's documentation above." Charlett Blake M.D. [facility] FAAPM&R (Neuromuscular Med) Diplomate Am Board of Electrodiagnostic Med Fellow Am Board of Interventional Pain

## 2019-10-21 NOTE — Progress Notes (Signed)
Dr. Tamala Julian notified of pts trach needing to be changed yesterday 10/20/19 and it was not done. This RT clarified with MD that there isn't a reason not to change trach. MD then said to wait on changing trach until pt has been on trach collar for 48 hours. Per verbal order from MD okay to change pts trach tomorrow 10/22/2019 to a #6 distal cuffed shiley XLT. RT will continue to monitor.

## 2019-10-21 NOTE — Progress Notes (Signed)
Physical Therapy Treatment Patient Details Name: [name] MRN: [id number] DOB: 1975/09/14 Today's Date: 10/21/2019    History of Present Illness 44 y/o M admitted 12/14 with COVID pneumonia causing acute hypoxemic respiratory failure.   Developed pneumomediastinum 12/23 with concern for possible bilateral small pneumothoraces on 12/24. Pt intubated on 12/26 and started on ECMO on 12/27. Tracheostomy on 12/29 with bleeding from trach an dother sites on 12/31. Bronch on 1/11 and 1/15. Slowly weaning sedation 2/5. Continuing to wean sedation and starting sweep trials to wean ECMO - Decannulated 3/4    PT Comments    Patient progressing well with mobility. Worked on there ex of UEs/LEs. Pt with improved strength in UEs especially with bicep activation bilaterally but with limited shoulder AROM secondary to pain with elevation.  Also noted to have trace activation of quadriceps and hip flexors. Worked on activating thoracic and lumbar extensors with pt in unsupported sitting; able to initiate and sustain for a short period. Used sheet for spinal extensor mobilizations with an emphasize on trunk rotation as well. Pt alert and following commands well. Able to communicate some with PMV but not able to tolerate long especially combined with activity. Sp02 remained >92% on 40% Fi02 7L 02 TC. Left in chair position in bed. Will continue to follow.   Follow Up Recommendations  CIR;Supervision for mobility/OOB;Supervision/Assistance - 24 hour     Equipment Recommendations  Other (comment)(defer)    Recommendations for Other Services       Precautions / Restrictions Precautions Precautions: Fall;Other (comment) Precaution Comments: trach collar Required Braces or Orthoses: Other Brace Other Brace: Bil Prevalon boots; Bil darco shoes, flexiseal Restrictions Weight Bearing Restrictions: No RLE Weight Bearing: Non weight bearing LLE Weight Bearing: Non weight bearing Other Position/Activity  Restrictions: due to tissue injury    Mobility  Bed Mobility Overal bed mobility: Needs Assistance Bed Mobility: Rolling Rolling: Total assist         General bed mobility comments: rolling to right/left for pericare. Minimal use of UEs due to decreased strength. Worked on coming to unsupported sitting with bed in chair position with Max A.  Transfers                 General transfer comment: Declined transfer to chair due to sore bottom, uncomfortable chair, flexiseal. Agreeable to bed in chair position.  Ambulation/Gait                 Stairs             Wheelchair Mobility    Modified Rankin (Stroke Patients Only)       Balance Overall balance assessment: Needs assistance Sitting-balance support: Feet unsupported;Bilateral upper extremity supported Sitting balance-Leahy Scale: Poor Sitting balance - Comments: With bed in chair position, used egress to come to unsupported sitting; worked on activating spinal extensors (with sheet to provide extensor mobs for ~5 minutes); pt able to initiate extensor musculature with cues but not sustain. Worked on trunk rotation/reaching with UEs using sheet to enforce extension/rotation. Postural control: Posterior lean                                  Cognition Arousal/Alertness: Awake/alert Behavior During Therapy: WFL for tasks assessed/performed Overall Cognitive Status: Difficult to assess Area of Impairment: Following commands                       Following  Commands: Follows one step commands consistently       General Comments: Appears WFL for basic mobility tasks; follows 1-2 step commands. Laughs appropriately at jokes. Speaking some with PMV. Explaining why he prefers not to sit in chair- sore butt, uncomfortable chair, flexiseal      Exercises General Exercises - Upper Extremity Shoulder Flexion: AAROM;Both;10 reps;Supine Elbow Flexion: AAROM;PROM;Both;10  reps;Supine Elbow Extension: AAROM;AROM;Both;10 reps;Supine Digit Composite Flexion: AROM;AAROM;Both;Supine;10 reps Composite Extension: AROM;AAROM;Both;10 reps;Supine General Exercises - Lower Extremity Long Arc Quad: AAROM;Both;5 reps;Seated Heel Slides: Both;Supine;5 reps;AAROM    General Comments General comments (skin integrity, edema, etc.): VSS on 40% Fi02 7L TC; increased coughing/secretions with mobility. Needed to remove PMV for activity due to drop in Sp02 and difficulty with SOB. Sp02 remained >92%.      Pertinent Vitals/Pain Pain Assessment: Faces Faces Pain Scale: Hurts little more Pain Location: buttocks Pain Descriptors / Indicators: Sore Pain Intervention(s): Repositioned;Monitored during session    Home Living                      Prior Function            PT Goals (current goals can now be found in the care plan section) Progress towards PT goals: Progressing toward goals    Frequency    Min 3X/week      PT Plan Current plan remains appropriate    Co-evaluation PT/OT/SLP Co-Evaluation/Treatment: Yes Reason for Co-Treatment: For patient/therapist safety;To address functional/ADL transfers   OT goals addressed during session: Strengthening/ROM;ADL's and self-care      AM-PAC PT "6 Clicks" Mobility   Outcome Measure  Help needed turning from your back to your side while in a flat bed without using bedrails?: Total Help needed moving from lying on your back to sitting on the side of a flat bed without using bedrails?: Total Help needed moving to and from a bed to a chair (including a wheelchair)?: Total Help needed standing up from a chair using your arms (e.g., wheelchair or bedside chair)?: Total Help needed to walk in hospital room?: Total Help needed climbing 3-5 steps with a railing? : Total 6 Click Score: 6    End of Session Equipment Utilized During Treatment: Oxygen Activity Tolerance: Patient tolerated treatment well Patient  left: with call bell/phone within reach;in bed(bed in chair position) Nurse Communication: Mobility status;Need for lift equipment PT Visit Diagnosis: Muscle weakness (generalized) (M62.81);Other symptoms and signs involving the nervous system (R29.898);Other abnormalities of gait and mobility (R26.89)     Time: 2924-4628 PT Time Calculation (min) (ACUTE ONLY): 62 min  Charges:  $Therapeutic Activity: 8-22 mins $Neuromuscular Re-education: 8-22 mins                     Marisa Severin, PT, DPT Acute Rehabilitation Services Pager [phone] Office [phone]       Marguarite Arbour A Sabra Heck 10/21/2019, 3:28 PM

## 2019-10-21 NOTE — Progress Notes (Signed)
NAME:  [name], MRN:  [id number], DOB:  [birth year], LOS: 91 ADMISSION DATE:  07/22/2019, CONSULTATION DATE:  12/24 REFERRING MD:  Sloan Leiter, CHIEF COMPLAINT:  Dyspnea   Brief History   44 y/o male admitted 12/14 with COVID pneumonia leading to ARDS, pneumomediastinum.  Intubated 12/26, bilateral chest tubes placed.  Past Medical History  GERD HTN Asthma  Significant Hospital Events   12/14 admit with hypoxemic respiratory failure in setting of COVID-19 pneumonia 12/23 noted to have pneumomediastinum on chest x-ray 12/24PCCM consulted for evaluation 12/26 worsening hypoxemia, intubated 12/27 VV fem/fem ECMO cannulation 12/29 tracheostomy >> See bedside nursing event notes for full rundown of procedures after 12/27 1/5 epistaxis, nasal packing 1/30 improved cxr, improved lung compliance, TV 350s on 15PC  1/31 CXR increased infiltrates, +CFB     2/2 Converted to right IJ Crescent cannula. 2/3 PEG tube inserted at bedside 2/14 30 min sweep trial> paO2 105 (18 PEEP, 60% FiO2) pH 7.22, pCO2 87 2/20 2 hours sweep trial 2/21 1.5 hour sweep trial 2/24 7-hour sweep trial.  Increasingly awake. 3/2 - decannulation attempt aborted due to increased hypercapnia when sedated for procedure 3/4- dcannulated 3/8 PEG tube repositioned by IR 3/9 >> trach wean 3/14 started 24/7 TC  Consults:  PCCM, CTS, CHF, Palliative  Procedures:  PICC 1/3 >> R radial a-line 1/20 >> R IJ ECMO cath 2/3 >> Tracheostomy 12/29 >> 10/14/2019 PEG tube replacement>>  Significant Diagnostic Tests:  CT chest 12/22>>extensive pneumomediastinum, multifocal patchy bilateral groundglass opacities CXR 1/25 >> improving bilateral infiltrates. ECMO cannulae in place. CXR 2/5 >> clearing bilateral infiltrates  Micro Data:  BCx2 12/14 >>negative  Sputum 12/15 >> OPF 12/28 blood > negative 12/29 > staph aureus 1/4 bronch:  1/2 acid fast smear bronch: negative 1/2 fungal cx bronch: negative1/12 BAL:  serratia, staph epidermidis 1/4 BAL > MSSA, Serratia 1/9 blood > negative 1/9 Resp culture > MSSA, serratia 1/11 serratia > negative 1/12 fungus culture > negative 1/12 BAL > serratia 1/18 BAL >> rare Serratia. 1/24 blood > negative 1/30 blood > negative 1/30 resp culture > serratia 2/1 BAL >> rare Serratia - enterococcus 2/1 aspergillus Ag > negative 2/2 resp culture > serratia  2/2 blood > negative 2/5 urine > negative 2/9 resp: serratia 2/15 BAL serratia 3/5 blood cultures ngtd  Antimicrobials:  Received remdesivir, steroids, actemra and convalescent plasma  See MAR for remainder  Interim history/subjective:  Doing well this AM.  Denies pain or dyspnea.  Remained off vent overnight.  Objective   Blood pressure 139/76, pulse (!) 105, temperature 98.1 F (36.7 C), temperature source Oral, resp. rate 15, height _0  (1.727 m), weight 128 kg, SpO2 100 %.    Vent Mode: Stand-by FiO2 (%):  [35 %-40 %] 40 % PEEP:  [8 cmH20] 8 cmH20 Pressure Support:  [10 cmH20] 10 cmH20   Intake/Output Summary (Last 24 hours) at 10/21/2019 0751 Last data filed at 10/20/2019 2000 Gross per 24 hour  Intake 669.38 ml  Output 3200 ml  Net -2530.62 ml   Filed Weights   10/18/19 0348 10/19/19 0454 10/20/19 0400  Weight: 128.2 kg 128.1 kg 128 kg    Examination: GEN: chronically ill appearing HEENT: MMM, trach in place, minimal secretions CV: RRR, ext warm PULM: Diminished bases, scattered rhonci GI: PEG in place, soft, +BS EXT: global anasarca NEURO: Moves all 4 ext to command PSYCH: RASS 0  SKIN: Slightly pale, multiple pressure wounds, eschar on lateral feet stable  ABG  Component Value Date/Time   PHART 7.384 10/10/2019 1817   PCO2ART 63.7 (H) 10/10/2019 1817   PO2ART 76.0 (L) 10/10/2019 1817   HCO3 38.0 (H) 10/10/2019 1817   TCO2 40 (H) 10/10/2019 1817   ACIDBASEDEF 4.0 (H) 09/16/2019 2334   O2SAT 84.5 10/20/2019 0500     Resolved Hospital Problem list    Pneumothoraces-resolved.  Assessment & Plan:   ARDS due to COVID 19, now requiring mechanical ventilation support at night Decannulated from VV ECMO on 3/4 after 65 days. Tracheostomy tube in place - Continue indefinite TC - PMV with SLP - Once 48h in, will consider changing to cuffless  Dislodged PEG tube Indurated area of the skin right lateral PEG tube. - resolved   AKI with azotemia - improving  Positive cumulative fluid balance Hypernatremia has corrected Severe third spacing -diuresis per CHF tea,  Hypertension - tapering oral antihypertensive - stable at this time   Serratia pneumonia> treated.   Neuromuscular weakness - OOB to chair as much as tolerated  - IPR consult/eval pending   Bilateral lower feet deep tissue injury Wound care continued for feet  Anemia due to critical illnes Conservative transfusion threshold  TF-induced diarrhea Metamucil PRN lomotil Okay for rectal tube if needed  Best practice:  Pain/Anxiety/Delirium protocol (if indicated): PRNs only VAP protocol (if indicated): Bundle in place. Respiratory support goals: Trach collar trial DVT prophylaxis: Lovenox GI prophylaxis: PPI Nutrition: TF Fluid status goals:  See above Urinary catheter: external urinary catheter Glucose control: dc levemir, SSI alone for now Mobility/therapy needs: up with PT.  Daily labs: As needed Code Status: Full code  Disposition: IPR once weaned from vent Family: updated when at bedside  Labs   CBC: CBC Latest Ref Rng & Units 10/21/2019 10/20/2019 10/19/2019  WBC 4.0 - 10.5 K/uL 6.0 5.7 5.1  Hemoglobin 13.0 - 17.0 g/dL 9.9(L) 9.1(L) 8.7(L)  Hematocrit 39.0 - 52.0 % 35.0(L) 31.9(L) 30.2(L)  Platelets 150 - 400 K/uL 249 250 243     Basic Metabolic Panel: BMP Latest Ref Rng & Units 10/21/2019 10/20/2019 10/19/2019  Glucose 70 - 99 mg/dL 92 82 79  BUN 6 - 20 mg/dL 38(H) 42(H) 41(H)  Creatinine 0.61 - 1.24 mg/dL 0.33(L) 0.49(L) 0.45(L)  Sodium 135 -  145 mmol/L 142 142 140  Potassium 3.5 - 5.1 mmol/L 3.8 4.2 4.0  Chloride 98 - 111 mmol/L 101 100 96(L)  CO2 22 - 32 mmol/L 31 35(H) 36(H)  Calcium 8.9 - 10.3 mg/dL 8.9 8.9 8.4(L)     GFR: Estimated Creatinine Clearance: 152.1 mL/min (A) (by C-G formula based on SCr of 0.33 mg/dL (L)). Recent Labs  Lab 10/18/19 0313 10/19/19 0309 10/20/19 0401 10/21/19 0440  WBC 5.2 5.1 5.7 6.0    Liver Function Tests: No results for input(s): AST, ALT, ALKPHOS, BILITOT, PROT, ALBUMIN in the last 168 hours. No results for input(s): LIPASE, AMYLASE in the last 168 hours. No results for input(s): AMMONIA in the last 168 hours.  ABG    Component Value Date/Time   PHART 7.384 10/10/2019 1817   PCO2ART 63.7 (H) 10/10/2019 1817   PO2ART 76.0 (L) 10/10/2019 1817   HCO3 38.0 (H) 10/10/2019 1817   TCO2 40 (H) 10/10/2019 1817   ACIDBASEDEF 4.0 (H) 09/16/2019 2334   O2SAT 84.5 10/20/2019 0500     Coagulation Profile: No results for input(s): INR, PROTIME in the last 168 hours.  Cardiac Enzymes: No results for input(s): CKTOTAL, CKMB, CKMBINDEX, TROPONINI in the last 168 hours.  HbA1C:  Hgb A1c MFr Bld  Date/Time Value Ref Range Status  09/23/2019 08:58 AM 5.5 4.8 - 5.6 % Final    Comment:    (NOTE) Pre diabetes:          5.7%-6.4% Diabetes:              >6.4% Glycemic control for   <7.0% adults with diabetes   07/24/2019 04:50 AM 6.7 (H) 4.8 - 5.6 % Final    Comment:    (NOTE) Pre diabetes:          5.7%-6.4% Diabetes:              >6.4% Glycemic control for   <7.0% adults with diabetes     CBG: Recent Labs  Lab 10/20/19 1707 10/20/19 2001 10/20/19 2344 10/21/19 0442 10/21/19 0736  GLUCAP 120* 82 104* 81 Pitman, MD [facility] 10/21/2019 7:51 AM

## 2019-10-21 NOTE — Telephone Encounter (Signed)
Patient wife called to let us know that he has been in the hospital for 3 month now and starting to ween of some on the machines. dont know when he will be able to leave the hospital. She does not know when he will start his the therapy.  She just wanted to let us know about him.

## 2019-10-21 NOTE — Progress Notes (Signed)
  Speech Language Pathology Treatment: [name] Speaking valve  Patient Details Name: [name] MRN: [id number] DOB: Jan 04, 1976 Today's Date: 10/21/2019 Time: 2671-2458 SLP Time Calculation (min) (ACUTE ONLY): 30 min  Assessment / Plan / Recommendation Clinical Impression  Pt was alert and coopertive throughout the session and reported that he has been tolerating the PMSV well with staff for communication. Cuff was deflated at baseline and vitals were HR 99, SpO2 94, and RR 23. He tolerated PMSV for 30 minutes with vitals raging HR 96-98, SpO2 94-99, and RR 19-19 during this period. Pt denied any sensation of respiratory difficulty but once asked, "How's my oxygen looking?". Vocal quality and breath support were significantly improved compared to that which was noted during the initial evaluation. Pt reported that his voice may be slightly "crackly" but was otherwise at baseline. Pt intermittently demonstrated voice breaks due to reduced breath support but was inconsistently able to self-correct. It is noteworthy that disfluencies were noted inconsistently observed throughout the session and pt denied having a fluency disorder at baseline. Disfluencies were mostly characterized by whole-word repetitions (e.g., "we we we were part of a cruise group") and part-word repetitions (e.g., "some some some something" and "pre pre pre pretty close") but fillers were also noted. PMSV may now be used with all therapies once supervision is provided. A swallow evaluation is also recommended at this time to assess swallow function and initiate dysphagia rehab as clinically indicated. Pt's attending has been advised of this recommendation.   HPI HPI: Pt is a 44 y/o male admitted 12/14 with COVID pneumonia leading to ARDS and needing mechanical ventilation, pneumomediastinum. He was intubated 12/26-12/29 with trach placement on latter date. PEG tube done on 09/11/19 and replaced on 10/14/19 after dislodging.  Decannulated from VV ECMO on 3/4 after 65 days. SLP was consulted for possible PMSV placement.      SLP Plan  Continue with current plan of care       Recommendations         Patient may use Passy-Muir Speech Valve: During all therapies with supervision PMSV Supervision: Full MD: Please consider changing trach tube to : Smaller size         General recommendations: Other(comment)(Orders for swallow evaluation) Oral Care Recommendations: Oral care BID Follow up Recommendations: LTACH SLP Visit Diagnosis: Aphonia (R49.1) Plan: Continue with current plan of care       [name], [place], [place] Office number [phone] Pager [phone]                [name] 10/21/2019, 6:02 PM

## 2019-10-21 NOTE — Progress Notes (Signed)
Inpatient Rehabilitation Admissions Coordinator  Discussed recommendations from Dr. Aretta Nip consult of LTACH with Dr. Haroldine Laws. Patient not yet at a level to tolerate the intensity of an inpt rehab admit. I will discuss with patient and his wife in the morning.  Danne [name], RN, MSN Rehab Admissions Coordinator [phone] 10/21/2019 4:34 PM

## 2019-10-21 NOTE — Progress Notes (Signed)
Patient ID: [name], male   DOB: [birth year], 45 y.o.   MRN: [id number]    Advanced Heart Failure Rounding Note   Subjective:    Was on TC overnight. Did ok. Had PMV in yesterday - tolerated ok.   Got IV lasix yesterday. Out 2.5 L. Weight unchanged.   Denies CP or SOB. Still weak.   CXR: more full in RUL   Objective:   Weight Range:  Vital Signs:   Temp:  [98.1 F (36.7 C)-99.8 F (37.7 C)] 98.1 F (36.7 C) (03/15 0732) Pulse Rate:  [94-110] 105 (03/15 0300) Resp:  [11-22] 15 (03/15 0300) BP: (138-166)/(75-87) 139/76 (03/15 0000) SpO2:  [99 %-100 %] 100 % (03/15 0300) FiO2 (%):  [35 %-40 %] 40 % (03/15 0300) Last BM Date: 10/20/19  Weight change: Filed Weights   10/18/19 0348 10/19/19 0454 10/20/19 0400  Weight: 128.2 kg 128.1 kg 128 kg    Intake/Output:   Intake/Output Summary (Last 24 hours) at 10/21/2019 0809 Last data filed at 10/20/2019 2000 Gross per 24 hour  Intake 555.18 ml  Output 3200 ml  Net -2644.82 ml     Physical Exam : General:  Sitting up in bed on TC with some secretions HEENT: normal Neck: supple. no JVD. + trach Carotids 2+ bilat; no bruits. No lymphadenopathy or thryomegaly appreciated. Cor: PMI nondisplaced. Regular rate & rhythm. No rubs, gallops or murmurs. Lungs: coarse Abdomen: obese soft, nontender, nondistended. No hepatosplenomegaly. No bruits or masses. Good bowel sounds. + PEG tube Extremities: no cyanosis, clubbing, rash, 1-2+ edema Neuro: alert & orientedx3, cranial nerves grossly intact. moves all 4 extremities w/o difficulty. Affect pleasant   Telemetry: Sinus 90-110  Personally reviewed   Labs: Basic Metabolic Panel: Recent Labs  Lab 10/17/19 0436 10/17/19 0436 10/18/19 0313 10/18/19 0313 10/19/19 0309 10/20/19 0401 10/21/19 0440  NA 141  --  140  --  140 142 142  K 3.9  --  4.3  --  4.0 4.2 3.8  CL 94*  --  95*  --  96* 100 101  CO2 38*  --  38*  --  36* 35* 31  GLUCOSE 97  --  86  --  79 82 92   BUN 36*  --  38*  --  41* 42* 38*  CREATININE 0.49*  --  0.46*  --  0.45* 0.49* 0.33*  CALCIUM 8.6*   < > 8.4*   < > 8.4* 8.9 8.9  MG 2.0  --  2.0  --  2.0 2.1 1.9  PHOS 2.7  --  2.2*  --  2.4* 2.8 3.2   < > = values in this interval not displayed.    Liver Function Tests: No results for input(s): AST, ALT, ALKPHOS, BILITOT, PROT, ALBUMIN in the last 168 hours. No results for input(s): LIPASE, AMYLASE in the last 168 hours. No results for input(s): AMMONIA in the last 168 hours.  CBC: Recent Labs  Lab 10/17/19 0436 10/18/19 0313 10/19/19 0309 10/20/19 0401 10/21/19 0440  WBC 5.1 5.2 5.1 5.7 6.0  HGB 8.9* 8.6* 8.7* 9.1* 9.9*  HCT 31.4* 30.2* 30.2* 31.9* 35.0*  MCV 97.2 96.5 95.6 96.7 97.0  PLT 227 244 243 250 249    Cardiac Enzymes: No results for input(s): CKTOTAL, CKMB, CKMBINDEX, TROPONINI in the last 168 hours.  BNP: BNP (last 3 results) Recent Labs    07/22/19 1039  BNP 15.3    ProBNP (last 3 results) No results for input(s): PROBNP  in the last 8760 hours.    Other results:  Imaging: DG CHEST PORT 1 VIEW  Result Date: 10/21/2019 CLINICAL DATA:  Tracheostomy, pneumonia, respiratory distress, asthma, hypertension EXAM: PORTABLE CHEST 1 VIEW COMPARISON:  Portable exam 0510 hours compared to 10/16/2019 FINDINGS: Tip of tracheostomy tube projects 3.7 cm above carina. RIGHT arm PICC line tip projects over SVC. Enlargement of cardiac silhouette. Patchy BILATERAL pulmonary infiltrates greater in RIGHT upper lobe and LEFT base. No definite pleural effusion or pneumothorax. IMPRESSION: Persistent BILATERAL pulmonary infiltrates consistent with multifocal pneumonia, slightly increased in RIGHT upper lobe since previous study. Electronically Signed   By: Lavonia Dana M.D.   On: 10/21/2019 07:55     Medications:     Scheduled Medications: . acetaminophen (TYLENOL) oral liquid 160 mg/5 mL  650 mg Per Tube Q6H  . amLODipine  5 mg Per Tube Daily  . vitamin C  500 mg  Per Tube Daily  . carvedilol  6.25 mg Oral BID WC  . chlorhexidine gluconate (MEDLINE KIT)  15 mL Mouth Rinse BID  . Chlorhexidine Gluconate Cloth  6 each Topical Daily  . cloNIDine  0.1 mg Per Tube BID  . diatrizoate meglumine-sodium  30 mL Oral Once  . enoxaparin (LOVENOX) injection  60 mg Subcutaneous Q24H  . feeding supplement (OSMOLITE 1.5 CAL)  237 mL Per Tube 6 X Daily  . feeding supplement (PRO-STAT SUGAR FREE 64)  60 mL Per Tube QID  . free water  150 mL Per Tube Q6H  . Gerhardt's butt cream   Topical QID  . insulin aspart  0-20 Units Subcutaneous Q4H  . ipratropium-albuterol  3 mL Nebulization TID  . magic mouthwash  5 mL Oral TID  . mouth rinse  15 mL Mouth Rinse 10 times per day  . montelukast  10 mg Per Tube QHS  . nutrition supplement (JUVEN)  1 packet Per Tube BID BM  . pantoprazole (PROTONIX) IV  40 mg Intravenous BID  . psyllium  1 packet Per Tube BID  . QUEtiapine  25 mg Per Tube QHS  . sodium chloride flush  10-40 mL Intracatheter Q12H    Infusions: . sodium chloride 250 mL (10/08/19 1626)  . sodium chloride Stopped (10/13/19 0548)  . dexmedetomidine (PRECEDEX) IV infusion 0.2 mcg/kg/hr (10/21/19 0005)    PRN Medications: sodium chloride, sodium chloride, acetaminophen (TYLENOL) oral liquid 160 mg/5 mL, acetaminophen, albuterol, [COMPLETED] diazepam **FOLLOWED BY** diazepam, diphenoxylate-atropine, hydrALAZINE, HYDROmorphone, magnesium hydroxide, midazolam, ondansetron (ZOFRAN) IV, senna-docusate, sodium chloride, sodium chloride flush   Assessment/Plan:    1. Acute hypoxic/hypercarbic respiratory failure due to COVID PNA: Remained markedly hypoxic despite full support. Intubated 12/26. S/p bilateral CTs 12/26 for pneumothoraces.  TEE on 12/26 LVEF 70% RV ok. VV ECMO begun 12/26. Tracheostomy 12/29.  COVID ARDS at this point. ECMO circuit changed 1/8 & 1/23. Crescent cannula placed 2/2 (femoral cannuals removed) - Decannulated 3/4 - Doing well with trach  collar trials of vent - CXR worse in RUL. May be related to decreased PEEP/vent support. Watch closely. D/w CCM - Repeat CXR in am - Give lasix 40 bid today supp K with it  2. COVID PNA: management of COVID infection per CCM. CXR with bilateral multifocal PNA progressing to likely ARDS.  - s/p 10 days of remdesivir - 12/16, 12/2 s/p tociluzimab - 12/17 s/p convalescent plasma - Decadron at 35m/daily completed 1/11  3. ID: Empiric coverage with vancomycin/cefepime initially for possible secondary bacterial PNA, course completed.   - Completes meropenem  today,  - Diarrhea improving  4. Anemia: - hgb 9.9. Follow  5. FEN - Remains on TFs  5. Hypernatremia -Resolved. Na 142 -Continue free water   6. AKI - Resolved.  Creatinine 0.33 today.  7. DTIs - On both feet.  These are improving  8.  Hypokalemia. -K 3.8 supp  9.  Diffuse muscle weakness. -This is consistent with ICU/steroid/paralytics myopathy.  Continue passive range of motion exercises with PT.  He is improving slowly.  -PT and OT are following.  Both have recommended CIR.  Consult placed. Await input today  10. HTN -Blood pressure well controlled. Continue current regimen.    Length of Stay: 52    [name] 10/21/2019, 8:09 AM  Advanced Heart Failure Team Pager [phone] (M-F; 7a - 4p)  Please contact [facility] for night-coverage after hours (4p -7a ) and weekends on [email or web address]

## 2019-10-21 NOTE — Progress Notes (Signed)
Occupational Therapy Treatment Patient Details Name: [name] MRN: [id number] DOB: 1976-02-27 Today's Date: 10/21/2019    History of present illness 44 y/o M admitted 12/14 with COVID pneumonia causing acute hypoxemic respiratory failure.   Developed pneumomediastinum 12/23 with concern for possible bilateral small pneumothoraces on 12/24. Pt intubated on 12/26 and started on ECMO on 12/27. Tracheostomy on 12/29 with bleeding from trach an dother sites on 12/31. Bronch on 1/11 and 1/15. Slowly weaning sedation 2/5. Continuing to wean sedation and starting sweep trials to wean ECMO - Decannulated 3/4   OT comments  Pt seen in conjunction with PT.  Worked on [employer]. UEs and LEs, rolled Lt and Rt for peri care, then moved to chair position and worked on sitting balance, trunk mobilizations, head/neck ROM.  He demonstrates improving strength all extremities and trunk.  Activity tolerance Is steadily improving, and cognition appears [facility] for basic tasks - PMV on for part of session (cuff deflated), pt joking and interacting with therapists, able to explain, in detail, why he didn't want to transfer to recliner - pain in peri area due to rawness from BMs and the rectal tube.  He requested alternate plan.  He is super motivated and was actively engaged in >68mns of activity.  VSS.    Follow Up Recommendations  CIR;Supervision/Assistance - 24 hour    Equipment Recommendations  None recommended by OT    Recommendations for Other Services      Precautions / Restrictions Precautions Precautions: Fall;Other (comment) Precaution Comments: trach collar Required Braces or Orthoses: Other Brace Other Brace: Bil Prevalon boots; Bil darco shoes, flexiseal Restrictions Weight Bearing Restrictions: No RLE Weight Bearing: Non weight bearing LLE Weight Bearing: Non weight bearing Other Position/Activity Restrictions: due to tissue injury       Mobility Bed Mobility Overal bed mobility: Needs  Assistance Bed Mobility: Rolling Rolling: Total assist         General bed mobility comments: rolling to right/left for pericare. Minimal use of UEs due to decreased strength. Worked on coming to unsupported sitting with bed in chair position with Max A.  Transfers                 General transfer comment: Declined transfer to chair due to sore bottom, uncomfortable chair, flexiseal. Agreeable to bed in chair position.    Balance Overall balance assessment: Needs assistance Sitting-balance support: Feet unsupported;Bilateral upper extremity supported Sitting balance-Leahy Scale: Poor Sitting balance - Comments: With bed in chair position, used egress to come to unsupported sitting; worked on activating spinal extensors (with sheet to provide extensor mobs for ~5 minutes); pt able to initiate extensor musculature with cues but not sustain. Worked on trunk rotation/reaching with UEs using sheet to enforce extension/rotation. Postural control: Posterior lean                                 ADL either performed or assessed with clinical judgement   ADL                                               Vision       Perception     Praxis      Cognition Arousal/Alertness: Awake/alert Behavior During Therapy: WFL for tasks assessed/performed Overall Cognitive Status: Difficult to assess Area  of Impairment: Following commands                       Following Commands: Follows one step commands consistently       General Comments: Appears WFL for basic mobility tasks; follows 1-2 step commands. Laughs appropriately at jokes. Speaking some with PMV. Explaining why he prefers not to sit in chair- sore butt, uncomfortable chair, flexiseal        Exercises General Exercises - Upper Extremity Shoulder Flexion: AAROM;Both;10 reps;Supine Elbow Flexion: AAROM;PROM;Both;10 reps;Supine Elbow Extension: AAROM;AROM;Both;10 reps;Supine Digit  Composite Flexion: AROM;AAROM;Both;Supine;10 reps Composite Extension: AROM;AAROM;Both;10 reps;Supine General Exercises - Lower Extremity Long Arc Quad: AAROM;Both;5 reps;Seated Heel Slides: Both;Supine;5 reps;AAROM Other Exercises Other Exercises: with pt in unsupported sitting, worked on thoracic and lumbar mobilizations.  Worked on anterior/posterior pelvic tilts as well as trunk rotation, and head neck AROM    Shoulder Instructions       General Comments VSS on 40% Fi02 7L TC; increased coughing/secretions with mobility. Needed to remove PMV for activity due to drop in Sp02 and difficulty with SOB. Sp02 remained >92%.    Pertinent Vitals/ Pain       Pain Assessment: Faces Faces Pain Scale: Hurts little more Pain Location: buttocks Pain Descriptors / Indicators: Sore Pain Intervention(s): Repositioned;Monitored during session  Home Living                                          Prior Functioning/Environment              Frequency  Min 3X/week        Progress Toward Goals  OT Goals(current goals can now be found in the care plan section)  Progress towards OT goals: Progressing toward goals     Plan Discharge plan needs to be updated    Co-evaluation    PT/OT/SLP Co-Evaluation/Treatment: Yes Reason for Co-Treatment: For patient/therapist safety;To address functional/ADL transfers   OT goals addressed during session: Strengthening/ROM;ADL's and self-care      AM-PAC OT "6 Clicks" Daily Activity     Outcome Measure   Help from another person eating meals?: Total Help from another person taking care of personal grooming?: A Lot Help from another person toileting, which includes using toliet, bedpan, or urinal?: Total Help from another person bathing (including washing, rinsing, drying)?: Total Help from another person to put on and taking off regular upper body clothing?: Total Help from another person to put on and taking off regular  lower body clothing?: Total 6 Click Score: 7    End of Session Equipment Utilized During Treatment: Oxygen  OT Visit Diagnosis: Muscle weakness (generalized) (M62.81)   Activity Tolerance Patient tolerated treatment well   Patient Left in bed;with call bell/phone within reach;Other (comment)(chair position )   Nurse Communication Mobility status        Time: 1102-1117 OT Time Calculation (min): 62 min  Charges: OT General Charges $OT Visit: 1 Visit OT Treatments $Neuromuscular Re-education: 23-37 mins  Nilsa Nutting., OTR/L Acute Rehabilitation Services Pager [phone] Office [phone]    Lucille Passy M 10/21/2019, 3:42 PM

## 2019-10-22 ENCOUNTER — Inpatient Hospital Stay (HOSPITAL_COMMUNITY): Payer: Managed Care, Other (non HMO)

## 2019-10-22 ENCOUNTER — Encounter: Payer: Self-pay | Admitting: *Deleted

## 2019-10-22 LAB — CBC
HCT: 34.3 % — ABNORMAL LOW (ref 39.0–52.0)
Hemoglobin: 9.9 g/dL — ABNORMAL LOW (ref 13.0–17.0)
MCH: 27.4 pg (ref 26.0–34.0)
MCHC: 28.9 g/dL — ABNORMAL LOW (ref 30.0–36.0)
MCV: 95 fL (ref 80.0–100.0)
Platelets: 249 10*3/uL (ref 150–400)
RBC: 3.61 MIL/uL — ABNORMAL LOW (ref 4.22–5.81)
RDW: 15.9 % — ABNORMAL HIGH (ref 11.5–15.5)
WBC: 5.8 10*3/uL (ref 4.0–10.5)
nRBC: 0 % (ref 0.0–0.2)

## 2019-10-22 LAB — GLUCOSE, CAPILLARY
Glucose-Capillary: 100 mg/dL — ABNORMAL HIGH (ref 70–99)
Glucose-Capillary: 76 mg/dL (ref 70–99)
Glucose-Capillary: 157 mg/dL — ABNORMAL HIGH (ref 70–99)
Glucose-Capillary: 71 mg/dL (ref 70–99)
Glucose-Capillary: 85 mg/dL (ref 70–99)

## 2019-10-22 LAB — BASIC METABOLIC PANEL
Anion gap: 9 (ref 5–15)
BUN: 48 mg/dL — ABNORMAL HIGH (ref 6–20)
CO2: 29 mmol/L (ref 22–32)
Calcium: 8.8 mg/dL — ABNORMAL LOW (ref 8.9–10.3)
Chloride: 104 mmol/L (ref 98–111)
Creatinine, Ser: 0.5 mg/dL — ABNORMAL LOW (ref 0.61–1.24)
GFR calc Af Amer: >60 mL/min (ref >60)
GFR calc non Af Amer: >60 mL/min (ref >60)
Glucose, Bld: 102 mg/dL — ABNORMAL HIGH (ref 70–99)
Potassium: 4 mmol/L (ref 3.5–5.1)
Sodium: 142 mmol/L (ref 135–145)

## 2019-10-22 LAB — PHOSPHORUS: Phosphorus: 3.2 mg/dL (ref 2.5–4.6)

## 2019-10-22 LAB — MAGNESIUM: Magnesium: 1.9 mg/dL (ref 1.7–2.4)

## 2019-10-22 MED ORDER — CLONIDINE HCL 0.1 MG PO TABS: 0.1000 mg | ORAL_TABLET | Freq: Two times a day (BID) | ORAL | Status: DC

## 2019-10-22 MED ORDER — QUETIAPINE FUMARATE 50 MG PO TABS
50.0000 mg | ORAL_TABLET | Freq: Every day | ORAL | Status: DC
  Administered 2019-10-22 – 2019-10-28 (×7): 50 mg
  Filled 2019-10-22 (×7): qty 1

## 2019-10-22 MED ORDER — MELATONIN 3 MG PO TABS
6.0000 mg | ORAL_TABLET | Freq: Every day | ORAL | Status: DC
  Administered 2019-10-22 – 2019-11-10 (×20): 6 mg
  Filled 2019-10-22 (×22): qty 2

## 2019-10-22 MED ORDER — CLONIDINE HCL 0.1 MG PO TABS
0.1000 mg | ORAL_TABLET | Freq: Four times a day (QID) | ORAL | Status: DC
  Administered 2019-10-22 – 2019-10-23 (×5): 0.1 mg via ORAL
  Filled 2019-10-22 (×5): qty 1

## 2019-10-22 MED ORDER — MELATONIN 5 MG PO TABS
6.0000 mg | ORAL_TABLET | Freq: Every day | ORAL | Status: DC
  Filled 2019-10-22: qty 1

## 2019-10-22 MED ORDER — MELATONIN 5 MG PO TABS
5.0000 mg | ORAL_TABLET | Freq: Every day | ORAL | Status: DC
  Filled 2019-10-22: qty 1

## 2019-10-22 NOTE — Progress Notes (Signed)
Physical Therapy Treatment Patient Details Name: [name] MRN: [id number] DOB: [birth year] Today's Date: 10/22/2019    History of Present Illness 44 y/o M admitted 12/14 with COVID pneumonia causing acute hypoxemic respiratory failure.   Developed pneumomediastinum 12/23 with concern for possible bilateral small pneumothoraces on 12/24. Pt intubated on 12/26 and started on ECMO on 12/27. Tracheostomy on 12/29 with bleeding from trach an dother sites on 12/31. Bronch on 1/11 and 1/15. Slowly weaning sedation 2/5. Continuing to wean sedation and starting sweep trials to wean ECMO - Decannulated 3/4    PT Comments    Pt progressing well towards goals. Able to tolerate transfer to chair using maxisky this session. Required max A +2 for rolling to place bed pad. Pt able to assist using bed rails. Performed BUE and BLE exercise this session. VSS on 28% Fi02 5L TC.  Pt continues to be motivated to work with therapies. Current recommendations appropriate. Will continue to follow acutely to maximize functional mobility independence and safety.   Follow Up Recommendations  CIR;Supervision for mobility/OOB;Supervision/Assistance - 24 hour     Equipment Recommendations  Other (comment)(defer)    Recommendations for Other Services       Precautions / Restrictions Precautions Precautions: Fall;Other (comment) Precaution Comments: trach collar Required Braces or Orthoses: Other Brace Other Brace: Bil Prevalon boots; Bil darco shoes, flexiseal Restrictions Weight Bearing Restrictions: No RLE Weight Bearing: Non weight bearing LLE Weight Bearing: Non weight bearing Other Position/Activity Restrictions: due to tissue injury    Mobility  Bed Mobility Overal bed mobility: Needs Assistance Bed Mobility: Rolling Rolling: Max assist;+2 for physical assistance         General bed mobility comments: Max A +2 to roll side to side for pad placement. Able to grab onto rail and assist some with  bed mobility.    Transfers Overall transfer level: Needs assistance               General transfer comment: total A for transfer to chair using maxisky.   Ambulation/Gait                 Stairs             Wheelchair Mobility    Modified Rankin (Stroke Patients Only)       Balance                                            Cognition Arousal/Alertness: Awake/alert Behavior During Therapy: WFL for tasks assessed/performed Overall Cognitive Status: Within Functional Limits for tasks assessed                                 General Comments: Appears WFL for basic mobility tasks.       Exercises General Exercises - Upper Extremity Shoulder Flexion: AAROM;Both;5 reps Elbow Flexion: AAROM;Both;5 reps Elbow Extension: AAROM;Both;5 reps Wrist Flexion: Both;5 reps;AAROM Wrist Extension: AAROM;Both;5 reps Digit Composite Flexion: AROM;Both;5 reps Composite Extension: AAROM;Both;5 reps General Exercises - Lower Extremity Ankle Circles/Pumps: PROM;Both;5 reps Quad Sets: AROM;Both;10 reps;Supine Heel Slides: AAROM;Both;5 reps Hip ABduction/ADduction: AAROM;Both;5 reps    General Comments General comments (skin integrity, edema, etc.): VSS on 28% Fi02 5L TC      Pertinent Vitals/Pain Pain Assessment: Faces Faces Pain Scale: Hurts little more Pain Location: buttocks when seated Pain Descriptors /  Indicators: Sore Pain Intervention(s): Limited activity within patient's tolerance;Monitored during session;Repositioned    Home Living                      Prior Function            PT Goals (current goals can now be found in the care plan section) Acute Rehab PT Goals Patient Stated Goal: regain strength and independence PT Goal Formulation: With patient/family Time For Goal Achievement: 10/30/19 Potential to Achieve Goals: Good Progress towards PT goals: Progressing toward goals    Frequency    Min  3X/week      PT Plan Current plan remains appropriate    Co-evaluation              AM-PAC PT "6 Clicks" Mobility   Outcome Measure  Help needed turning from your back to your side while in a flat bed without using bedrails?: Total Help needed moving from lying on your back to sitting on the side of a flat bed without using bedrails?: Total Help needed moving to and from a bed to a chair (including a wheelchair)?: Total Help needed standing up from a chair using your arms (e.g., wheelchair or bedside chair)?: Total Help needed to walk in hospital room?: Total Help needed climbing 3-5 steps with a railing? : Total 6 Click Score: 6    End of Session Equipment Utilized During Treatment: Oxygen Activity Tolerance: Patient tolerated treatment well Patient left: in chair;with call bell/phone within reach;with nursing/sitter in room;with family/visitor present Nurse Communication: Mobility status PT Visit Diagnosis: Muscle weakness (generalized) (M62.81);Other symptoms and signs involving the nervous system (R29.898);Other abnormalities of gait and mobility (R26.89)     Time: 7096-2836 PT Time Calculation (min) (ACUTE ONLY): 34 min  Charges:  $Therapeutic Exercise: 8-22 mins $Therapeutic Activity: 8-22 mins                     Lou Miner, DPT  Acute Rehabilitation Services  Pager: [phone] Office: [phone]    Rudean Hitt 10/22/2019, 5:47 PM

## 2019-10-22 NOTE — Progress Notes (Signed)
Per Dr. Tamala Julian CCM MD verbal order to change pt to a #6 distal cuffless Shiley XLT. Per MD request a distal #6 cuffed Shiley XLT to remain at bedside. RN aware. RT will continue to monitor.

## 2019-10-22 NOTE — Telephone Encounter (Signed)
Wow I glanced at his notes and it seems that he has had quite a complicated course.  Thank you for the update.  Salvatore Marvel, MD [facility]

## 2019-10-22 NOTE — Progress Notes (Signed)
Patient ID: [name], male   DOB: 11-03-1975, 44 y.o.   MRN: [id number]    Advanced Heart Failure Rounding Note   Subjective:    On TC all night. Respiratory status stable. Secretions improved   Diuresed. Weight down 10 pounds overnight  CXR with bilateral infiltrates - unchanged (viewed personally)  Working with PT.   CIR has seen and they don't feel he is ready. Just yet  Objective:   Weight Range:  Vital Signs:   Temp:  [98.4 F (36.9 C)-99.2 F (37.3 C)] 99.1 F (37.3 C) (03/16 0753) Pulse Rate:  [78-122] 88 (03/16 0600) Resp:  [15-28] 19 (03/16 0600) BP: (109-146)/(55-97) 121/55 (03/16 0600) SpO2:  [88 %-100 %] 91 % (03/16 0600) FiO2 (%):  [28 %-35 %] 35 % (03/16 0358) Weight:  [123.8 kg] 123.8 kg (03/16 0600) Last BM Date: 10/21/19  Weight change: Filed Weights   10/19/19 0454 10/20/19 0400 10/22/19 0600  Weight: 128.1 kg 128 kg 123.8 kg    Intake/Output:   Intake/Output Summary (Last 24 hours) at 10/22/2019 0818 Last data filed at 10/22/2019 0600 Gross per 24 hour  Intake 3564.05 ml  Output 2925 ml  Net 639.05 ml     Physical Exam : General:  Sitting up in bed on TC with some secretions HEENT: normal  Neck: supple. + trach  Carotids 2+ bilat; no bruits. No lymphadenopathy or thryomegaly appreciated. Cor: PMI nondisplaced. Regular rate & rhythm. No rubs, gallops or murmurs. Lungs: coarse Abdomen: obese soft, nontender, nondistended. No hepatosplenomegaly. No bruits or masses. Good bowel sounds. + PEG Extremities: no cyanosis, clubbing, rash, tr edema + feet DTI Neuro: alert & orientedx3, cranial nerves grossly intact. Extremities weak   Telemetry: Sinus 80s Personally reviewed   Labs: Basic Metabolic Panel: Recent Labs  Lab 10/18/19 0313 10/18/19 0313 10/19/19 0309 10/19/19 0309 10/20/19 0401 10/21/19 0440 10/22/19 0358  NA 140  --  140  --  142 142 142  K 4.3  --  4.0  --  4.2 3.8 4.0  CL 95*  --  96*  --  100 101 104  CO2  38*  --  36*  --  35* 31 29  GLUCOSE 86  --  79  --  82 92 102*  BUN 38*  --  41*  --  42* 38* 48*  CREATININE 0.46*  --  0.45*  --  0.49* 0.33* 0.50*  CALCIUM 8.4*   < > 8.4*   < > 8.9 8.9 8.8*  MG 2.0  --  2.0  --  2.1 1.9 1.9  PHOS 2.2*  --  2.4*  --  2.8 3.2 3.2   < > = values in this interval not displayed.    Liver Function Tests: No results for input(s): AST, ALT, ALKPHOS, BILITOT, PROT, ALBUMIN in the last 168 hours. No results for input(s): LIPASE, AMYLASE in the last 168 hours. No results for input(s): AMMONIA in the last 168 hours.  CBC: Recent Labs  Lab 10/18/19 0313 10/19/19 0309 10/20/19 0401 10/21/19 0440 10/22/19 0358  WBC 5.2 5.1 5.7 6.0 5.8  HGB 8.6* 8.7* 9.1* 9.9* 9.9*  HCT 30.2* 30.2* 31.9* 35.0* 34.3*  MCV 96.5 95.6 96.7 97.0 95.0  PLT 244 243 250 249 249    Cardiac Enzymes: No results for input(s): CKTOTAL, CKMB, CKMBINDEX, TROPONINI in the last 168 hours.  BNP: BNP (last 3 results) Recent Labs    07/22/19 1039  BNP 15.3    ProBNP (last  3 results) No results for input(s): PROBNP in the last 8760 hours.    Other results:  Imaging: DG CHEST PORT 1 VIEW  Result Date: 10/21/2019 CLINICAL DATA:  Tracheostomy, pneumonia, respiratory distress, asthma, hypertension EXAM: PORTABLE CHEST 1 VIEW COMPARISON:  Portable exam 0510 hours compared to 10/16/2019 FINDINGS: Tip of tracheostomy tube projects 3.7 cm above carina. RIGHT arm PICC line tip projects over SVC. Enlargement of cardiac silhouette. Patchy BILATERAL pulmonary infiltrates greater in RIGHT upper lobe and LEFT base. No definite pleural effusion or pneumothorax. IMPRESSION: Persistent BILATERAL pulmonary infiltrates consistent with multifocal pneumonia, slightly increased in RIGHT upper lobe since previous study. Electronically Signed   By: Lavonia Dana M.D.   On: 10/21/2019 07:55     Medications:     Scheduled Medications: . acetaminophen (TYLENOL) oral liquid 160 mg/5 mL  650 mg Per Tube  Q6H  . amLODipine  5 mg Per Tube Daily  . vitamin C  500 mg Per Tube Daily  . carvedilol  6.25 mg Oral BID WC  . chlorhexidine gluconate (MEDLINE KIT)  15 mL Mouth Rinse BID  . Chlorhexidine Gluconate Cloth  6 each Topical Daily  . cloNIDine  0.1 mg Oral QID   Followed by  . [START ON 10/24/2019] cloNIDine  0.1 mg Oral Q12H  . diatrizoate meglumine-sodium  30 mL Oral Once  . enoxaparin (LOVENOX) injection  60 mg Subcutaneous Q24H  . feeding supplement (OSMOLITE 1.5 CAL)  237 mL Per Tube 6 X Daily  . feeding supplement (PRO-STAT SUGAR FREE 64)  60 mL Per Tube QID  . free water  150 mL Per Tube Q6H  . Gerhardt's butt cream   Topical QID  . insulin aspart  0-20 Units Subcutaneous Q4H  . ipratropium-albuterol  3 mL Nebulization TID  . magic mouthwash  5 mL Oral TID  . mouth rinse  15 mL Mouth Rinse 10 times per day  . Melatonin  5 mg Per Tube QHS  . montelukast  10 mg Per Tube QHS  . nutrition supplement (JUVEN)  1 packet Per Tube BID BM  . pantoprazole (PROTONIX) IV  40 mg Intravenous BID  . psyllium  1 packet Per Tube BID  . QUEtiapine  50 mg Per Tube QHS  . sodium chloride flush  10-40 mL Intracatheter Q12H    Infusions: . sodium chloride 250 mL (10/08/19 1626)  . sodium chloride Stopped (10/13/19 0548)  . dexmedetomidine (PRECEDEX) IV infusion 0.6 mcg/kg/hr (10/22/19 0600)    PRN Medications: sodium chloride, sodium chloride, acetaminophen (TYLENOL) oral liquid 160 mg/5 mL, acetaminophen, albuterol, [COMPLETED] diazepam **FOLLOWED BY** diazepam, diphenoxylate-atropine, hydrALAZINE, HYDROmorphone, magnesium hydroxide, midazolam, ondansetron (ZOFRAN) IV, senna-docusate, sodium chloride, sodium chloride flush   Assessment/Plan:    1. Acute hypoxic/hypercarbic respiratory failure due to COVID PNA: Remained markedly hypoxic despite full support. Intubated 12/26. S/p bilateral CTs 12/26 for pneumothoraces.  TEE on 12/26 LVEF 70% RV ok. VV ECMO begun 12/26. Tracheostomy 12/29.   COVID ARDS at this point. ECMO circuit changed 1/8 & 1/23. Crescent cannula placed 2/2 (femoral cannuals removed) - Decannulated 3/4 - Doing well with trach collar trials  Off vent - CXR still with bilateral infiltrates. May be related to decreased PEEP/vent support. Watch closely. D/w CCM  - Diuresed well yesterday. Will hold lasix today. - Wean precedex off today. Start clonidine taper  2. COVID PNA: management of COVID infection per CCM. CXR with bilateral multifocal PNA progressing to likely ARDS.  - s/p 10 days of remdesivir - 12/16,  12/2 s/p tociluzimab - 12/17 s/p convalescent plasma - Decadron at 30m/daily completed 1/11  3. ID: Empiric coverage with vancomycin/cefepime initially for possible secondary bacterial PNA, course completed.   - Completes meropenem today,  - Diarrhea improving  4. Anemia: - hgb stable at 9.9. Follow  5. FEN - Remains on TFs. Stable  5. Hypernatremia -Resolved. Na 142 -Continue free water at current dose  6. AKI - Resolved.  Creatinine 0.50 today.  7. DTIs - On both feet.  These are improving  8.  Hypokalemia. -K 3.8 supp  9.  Diffuse muscle weakness. -This is consistent with ICU/steroid/paralytics myopathy.  Continue passive range of motion exercises with PT.  He is improving slowly.  -PT and OT are following.  Both have recommended CIR.  CIR has seen and feel he is not ready yet  - I will d/w PT to come up with coordinated rehab plan to get him ready for CIR  10. HTN -Blood pressure well controlled. Continue current regimen.     Length of Stay: 986   DGlori BickersMD 10/22/2019, 8:18 AM  Advanced Heart Failure Team Pager [phone](M-F; 7a - 4p)  Please contact [facility] for night-coverage after hours (4p -7a ) and weekends on [email or web address]

## 2019-10-22 NOTE — Progress Notes (Addendum)
Palliative Medicine RN Note: Rec'd a call from patient's wife [name] asking for help with diagnosis portion of online disability forms for [employer]. Fortunately for Aydden, he has progressed beyond needing PMT to complete forms for or with him.   PMT NP recommended she bring in forms/laptop for pt's RN to review diagnoses in person [name] requested an in-person review). Visitation policy changes tomorrow to allow 2 visitors, so she should be able to come in as Everhett's second visitor.  [name] will reach out to Esteven's nurse for help with either completing forms or setting up a meeting with his attending physician as needed to obtain the diagnosis information for his paperwork.  Marjie Skiff Al Gagen, RN, BSN, [facility] Palliative Medicine Team 10/22/2019 1:13 PM Office [phone]

## 2019-10-22 NOTE — Progress Notes (Signed)
NAME:  [name], MRN:  [id number], DOB:  [birth year], LOS: 92 ADMISSION DATE:  07/22/2019, CONSULTATION DATE:  12/24 REFERRING MD:  Sloan Leiter, CHIEF COMPLAINT:  Dyspnea   Brief History   43 y/o male admitted 12/14 with COVID pneumonia leading to ARDS, pneumomediastinum.  Intubated 12/26, bilateral chest tubes placed.  Past Medical History  GERD HTN Asthma  Significant Hospital Events   12/14 admit with hypoxemic respiratory failure in setting of COVID-19 pneumonia 12/23 noted to have pneumomediastinum on chest x-ray 12/24PCCM consulted for evaluation 12/26 worsening hypoxemia, intubated 12/27 VV fem/fem ECMO cannulation 12/29 tracheostomy >> See bedside nursing event notes for full rundown of procedures after 12/27 1/5 epistaxis, nasal packing 1/30 improved cxr, improved lung compliance, TV 350s on 15PC  1/31 CXR increased infiltrates, +CFB     2/2 Converted to right IJ Crescent cannula. 2/3 PEG tube inserted at bedside 2/14 30 min sweep trial> paO2 105 (18 PEEP, 60% FiO2) pH 7.22, pCO2 87 2/20 2 hours sweep trial 2/21 1.5 hour sweep trial 2/24 7-hour sweep trial.  Increasingly awake. 3/2 - decannulation attempt aborted due to increased hypercapnia when sedated for procedure 3/4- dcannulated 3/8 PEG tube repositioned by IR 3/9 >> trach wean 3/14 started 24/7 TC  Consults:  PCCM, CTS, CHF, Palliative  Procedures:  PICC 1/3 >> R radial a-line 1/20 >> R IJ ECMO cath 2/3 >> Tracheostomy 12/29 >> 10/14/2019 PEG tube replacement>>  Significant Diagnostic Tests:  CT chest 12/22>>extensive pneumomediastinum, multifocal patchy bilateral groundglass opacities CXR 1/25 >> improving bilateral infiltrates. ECMO cannulae in place. CXR 2/5 >> clearing bilateral infiltrates  Micro Data:  BCx2 12/14 >>negative  Sputum 12/15 >> OPF 12/28 blood > negative 12/29 > staph aureus 1/4 bronch:  1/2 acid fast smear bronch: negative 1/2 fungal cx bronch: negative1/12 BAL:  serratia, staph epidermidis 1/4 BAL > MSSA, Serratia 1/9 blood > negative 1/9 Resp culture > MSSA, serratia 1/11 serratia > negative 1/12 fungus culture > negative 1/12 BAL > serratia 1/18 BAL >> rare Serratia. 1/24 blood > negative 1/30 blood > negative 1/30 resp culture > serratia 2/1 BAL >> rare Serratia - enterococcus 2/1 aspergillus Ag > negative 2/2 resp culture > serratia  2/2 blood > negative 2/5 urine > negative 2/9 resp: serratia 2/15 BAL serratia 3/5 blood cultures ngtd  Antimicrobials:  Received remdesivir, steroids, actemra and convalescent plasma  See MAR for remainder  Interim history/subjective:  No events. Gets anxious overnight. Remains on precedex.  Objective   Blood pressure (!) 121/55, pulse 88, temperature 98.8 F (37.1 C), temperature source Axillary, resp. rate 19, height _0  (1.727 m), weight 123.8 kg, SpO2 91 %.    Vent Mode: Stand-by FiO2 (%):  [28 %-35 %] 35 %   Intake/Output Summary (Last 24 hours) at 10/22/2019 0753 Last data filed at 10/22/2019 0600 Gross per 24 hour  Intake 3564.05 ml  Output 2925 ml  Net 639.05 ml   Filed Weights   10/19/19 0454 10/20/19 0400 10/22/19 0600  Weight: 128.1 kg 128 kg 123.8 kg    Examination: GEN: chronically ill appearing HEENT: MMM, trach in place, minimal secretions CV: RRR, ext warm PULM: Diminished bases, scattered rhonci GI: PEG in place, soft, +BS EXT: global anasarca NEURO: Moves all 4 ext to command PSYCH: RASS 0  SKIN: Slightly pale, multiple pressure wounds, eschar on lateral feet stable  ABG    Component Value Date/Time   PHART 7.384 10/10/2019 1817   PCO2ART 63.7 (H) 10/10/2019 1817   PO2ART  76.0 (L) 10/10/2019 1817   HCO3 38.0 (H) 10/10/2019 1817   TCO2 40 (H) 10/10/2019 1817   ACIDBASEDEF 4.0 (H) 09/16/2019 2334   O2SAT 84.5 10/20/2019 0500     Resolved Hospital Problem list   Pneumothoraces-resolved.  Assessment & Plan:   ARDS due to COVID 19, now requiring  mechanical ventilation support at night Decannulated from VV ECMO on 3/4 after 65 days. Tracheostomy tube in place - Continue indefinite TC - PMV with SLP - Change to 6 distal XLT cuffless today  Dislodged PEG tube Indurated area of the skin right lateral PEG tube. - resolved   AKI with azotemia - improving  Positive cumulative fluid balance Hypernatremia has corrected Severe third spacing -diuresis per CHF team  Hypertension - tapering oral antihypertensive - stable at this time   Serratia pneumonia> treated.   Neuromuscular weakness - OOB to chair as much as tolerated  - IPR consult/eval pending   Bilateral lower feet deep tissue injury Wound care continued for feet  Anemia due to critical illnes Conservative transfusion threshold  TF-induced diarrhea Metamucil PRN lomotil Okay for rectal tube if needed  Best practice:  Pain/Anxiety/Delirium protocol (if indicated): - Dilaudid 34m q6h PRN - Precedex- clonidine taper to start today - Valium 2.550mBID PRN - Seroquel 5023mHS (increased today) - Melatonin started today  VAP protocol (if indicated): Bundle in place. Respiratory support goals: Trach collar trial DVT prophylaxis: Lovenox GI prophylaxis: PPI Nutrition: TF Fluid status goals:  See above Urinary catheter: external urinary catheter Glucose control: doing well with SSI alone Mobility/therapy needs: up with PT.  Daily labs: As needed Code Status: Full code  Disposition: to be determined Family: updated when at bedside  Labs   CBC: CBC Latest Ref Rng & Units 10/22/2019 10/21/2019 10/20/2019  WBC 4.0 - 10.5 K/uL 5.8 6.0 5.7  Hemoglobin 13.0 - 17.0 g/dL 9.9(L) 9.9(L) 9.1(L)  Hematocrit 39.0 - 52.0 % 34.3(L) 35.0(L) 31.9(L)  Platelets 150 - 400 K/uL 249 249 250     Basic Metabolic Panel: BMP Latest Ref Rng & Units 10/22/2019 10/21/2019 10/20/2019  Glucose 70 - 99 mg/dL 102(H) 92 82  BUN 6 - 20 mg/dL 48(H) 38(H) 42(H)  Creatinine 0.61 - 1.24 mg/dL  0.50(L) 0.33(L) 0.49(L)  Sodium 135 - 145 mmol/L 142 142 142  Potassium 3.5 - 5.1 mmol/L 4.0 3.8 4.2  Chloride 98 - 111 mmol/L 104 101 100  CO2 22 - 32 mmol/L 29 31 35(H)  Calcium 8.9 - 10.3 mg/dL 8.8(L) 8.9 8.9     GFR: Estimated Creatinine Clearance: 149.4 mL/min (A) (by C-G formula based on SCr of 0.5 mg/dL (L)). Recent Labs  Lab 10/19/19 0309 10/20/19 0401 10/21/19 0440 10/22/19 0358  WBC 5.1 5.7 6.0 5.8    Liver Function Tests: No results for input(s): AST, ALT, ALKPHOS, BILITOT, PROT, ALBUMIN in the last 168 hours. No results for input(s): LIPASE, AMYLASE in the last 168 hours. No results for input(s): AMMONIA in the last 168 hours.  ABG    Component Value Date/Time   PHART 7.384 10/10/2019 1817   PCO2ART 63.7 (H) 10/10/2019 1817   PO2ART 76.0 (L) 10/10/2019 1817   HCO3 38.0 (H) 10/10/2019 1817   TCO2 40 (H) 10/10/2019 1817   ACIDBASEDEF 4.0 (H) 09/16/2019 2334   O2SAT 84.5 10/20/2019 0500     Coagulation Profile: No results for input(s): INR, PROTIME in the last 168 hours.  Cardiac Enzymes: No results for input(s): CKTOTAL, CKMB, CKMBINDEX, TROPONINI in the last 168  hours.  HbA1C: Hgb A1c MFr Bld  Date/Time Value Ref Range Status  09/23/2019 08:58 AM 5.5 4.8 - 5.6 % Final    Comment:    (NOTE) Pre diabetes:          5.7%-6.4% Diabetes:              >6.4% Glycemic control for   <7.0% adults with diabetes   07/24/2019 04:50 AM 6.7 (H) 4.8 - 5.6 % Final    Comment:    (NOTE) Pre diabetes:          5.7%-6.4% Diabetes:              >6.4% Glycemic control for   <7.0% adults with diabetes     CBG: Recent Labs  Lab 10/21/19 1138 10/21/19 1522 10/21/19 1940 10/21/19 2332 10/22/19 0344  GLUCAP 132* 102* 98 100* 100*    Candee Furbish, MD [facility] 10/22/2019 7:53 AM

## 2019-10-23 LAB — BASIC METABOLIC PANEL
Anion gap: 12 (ref 5–15)
BUN: 47 mg/dL — ABNORMAL HIGH (ref 6–20)
CO2: 27 mmol/L (ref 22–32)
Calcium: 9.1 mg/dL (ref 8.9–10.3)
Chloride: 103 mmol/L (ref 98–111)
Creatinine, Ser: 0.54 mg/dL — ABNORMAL LOW (ref 0.61–1.24)
GFR calc Af Amer: >60 mL/min (ref >60)
GFR calc non Af Amer: >60 mL/min (ref >60)
Glucose, Bld: 97 mg/dL (ref 70–99)
Potassium: 3.7 mmol/L (ref 3.5–5.1)
Sodium: 142 mmol/L (ref 135–145)

## 2019-10-23 LAB — GLUCOSE, CAPILLARY
Glucose-Capillary: 128 mg/dL — ABNORMAL HIGH (ref 70–99)
Glucose-Capillary: 155 mg/dL — ABNORMAL HIGH (ref 70–99)
Glucose-Capillary: 164 mg/dL — ABNORMAL HIGH (ref 70–99)
Glucose-Capillary: 74 mg/dL (ref 70–99)
Glucose-Capillary: 88 mg/dL (ref 70–99)
Glucose-Capillary: 97 mg/dL (ref 70–99)

## 2019-10-23 LAB — CBC
HCT: 36.8 % — ABNORMAL LOW (ref 39.0–52.0)
Hemoglobin: 10.8 g/dL — ABNORMAL LOW (ref 13.0–17.0)
MCH: 27.4 pg (ref 26.0–34.0)
MCHC: 29.3 g/dL — ABNORMAL LOW (ref 30.0–36.0)
MCV: 93.4 fL (ref 80.0–100.0)
Platelets: 244 10*3/uL (ref 150–400)
RBC: 3.94 MIL/uL — ABNORMAL LOW (ref 4.22–5.81)
RDW: 16.4 % — ABNORMAL HIGH (ref 11.5–15.5)
WBC: 7.6 10*3/uL (ref 4.0–10.5)
nRBC: 0 % (ref 0.0–0.2)

## 2019-10-23 LAB — PHOSPHORUS: Phosphorus: 3.5 mg/dL (ref 2.5–4.6)

## 2019-10-23 LAB — MAGNESIUM: Magnesium: 1.8 mg/dL (ref 1.7–2.4)

## 2019-10-23 MED ORDER — CLONIDINE HCL 0.1 MG PO TABS
0.1000 mg | ORAL_TABLET | Freq: Two times a day (BID) | ORAL | Status: AC
  Administered 2019-10-24 – 2019-10-28 (×10): 0.1 mg
  Filled 2019-10-23 (×11): qty 1

## 2019-10-23 MED ORDER — CLONIDINE HCL 0.1 MG PO TABS
0.1000 mg | ORAL_TABLET | Freq: Four times a day (QID) | ORAL | Status: AC
  Administered 2019-10-23 (×3): 0.1 mg
  Filled 2019-10-23 (×3): qty 1

## 2019-10-23 MED ORDER — ORAL CARE MOUTH RINSE
15.0000 mL | Freq: Two times a day (BID) | OROMUCOSAL | Status: DC
  Administered 2019-10-23: 15 mL via OROMUCOSAL

## 2019-10-23 MED ORDER — CARVEDILOL 6.25 MG PO TABS
6.2500 mg | ORAL_TABLET | Freq: Two times a day (BID) | ORAL | Status: DC
  Administered 2019-10-23: 6.25 mg
  Filled 2019-10-23: qty 1

## 2019-10-23 MED ORDER — MAGNESIUM SULFATE 2 GM/50ML IV SOLN
2.0000 g | Freq: Once | INTRAVENOUS | Status: AC
  Administered 2019-10-23: 2 g via INTRAVENOUS
  Filled 2019-10-23: qty 50

## 2019-10-23 MED ORDER — CHLORHEXIDINE GLUCONATE 0.12 % MT SOLN: 15.0000 mL | Freq: Two times a day (BID) | OROMUCOSAL | Status: DC

## 2019-10-23 MED ORDER — CLONAZEPAM 0.5 MG PO TBDP
1.0000 mg | ORAL_TABLET | Freq: Every day | ORAL | Status: DC
  Administered 2019-10-23 – 2019-11-10 (×19): 1 mg
  Filled 2019-10-23 (×19): qty 2

## 2019-10-23 MED ORDER — TRAZODONE HCL 50 MG PO TABS: 50.0000 mg | ORAL_TABLET | Freq: Every day | ORAL | Status: DC

## 2019-10-23 MED ORDER — ESCITALOPRAM OXALATE 10 MG PO TABS
10.0000 mg | ORAL_TABLET | Freq: Every day | ORAL | Status: DC
  Administered 2019-10-23 – 2019-10-31 (×9): 10 mg
  Filled 2019-10-23 (×9): qty 1

## 2019-10-23 MED ORDER — POTASSIUM CHLORIDE 20 MEQ/15ML (10%) PO SOLN: 20.0000 meq | ORAL | Status: DC

## 2019-10-23 MED ORDER — ORAL CARE MOUTH RINSE
15.0000 mL | Freq: Two times a day (BID) | OROMUCOSAL | Status: DC
  Administered 2019-10-24 – 2019-11-14 (×37): 15 mL via OROMUCOSAL

## 2019-10-23 MED ORDER — LORAZEPAM 2 MG/ML IJ SOLN: 2.0000 mg | Freq: Every evening | INTRAMUSCULAR | Status: DC | PRN

## 2019-10-23 MED ORDER — CHLORHEXIDINE GLUCONATE 0.12 % MT SOLN
15.0000 mL | Freq: Two times a day (BID) | OROMUCOSAL | Status: DC
  Administered 2019-10-24 – 2019-11-14 (×38): 15 mL via OROMUCOSAL
  Filled 2019-10-23 (×32): qty 15

## 2019-10-23 MED ORDER — PANTOPRAZOLE SODIUM 40 MG PO PACK
40.0000 mg | PACK | Freq: Two times a day (BID) | ORAL | Status: DC
  Administered 2019-10-23 – 2019-11-10 (×37): 40 mg
  Filled 2019-10-23 (×37): qty 20

## 2019-10-23 MED ORDER — TRAZODONE HCL 50 MG PO TABS
100.0000 mg | ORAL_TABLET | Freq: Every day | ORAL | Status: DC
  Administered 2019-10-23 – 2019-11-10 (×19): 100 mg
  Filled 2019-10-23 (×19): qty 2

## 2019-10-23 MED ORDER — TRAZODONE HCL 50 MG PO TABS: 100.0000 mg | ORAL_TABLET | Freq: Every day | ORAL | Status: DC

## 2019-10-23 MED ORDER — POTASSIUM CHLORIDE 20 MEQ/15ML (10%) PO SOLN
20.0000 meq | ORAL | Status: AC
  Administered 2019-10-23 (×2): 20 meq
  Filled 2019-10-23 (×2): qty 15

## 2019-10-23 MED ORDER — LORAZEPAM 1 MG PO TABS: 2.0000 mg | ORAL_TABLET | Freq: Every evening | ORAL | Status: DC | PRN

## 2019-10-23 MED ORDER — CLONAZEPAM 1 MG PO TABS: 1.0000 mg | ORAL_TABLET | Freq: Every day | ORAL | Status: DC

## 2019-10-23 NOTE — Progress Notes (Signed)
  Speech Language Pathology Treatment: [name] Speaking valve  Patient Details Name: [name] MRN: [id number] DOB: 05-Aug-1976 Today's Date: 10/23/2019 Time: 9163-8466 SLP Time Calculation (min) (ACUTE ONLY): 28 min  Assessment / Plan / Recommendation Clinical Impression  Pt was alert and coopertive throughout the session with PMSV in place upon SLP's arrival. Pt reported that he enjoys having the valve and being able to communicate.  He tolerated PMSV for 43 minutes total including the time of the swallow evaluation. Vital range was HR 111-118, SpO2 91-93, and RR 22-29 during this period and pt denied any respiratory difficulty but reported fatigue from lack of sleep. Vocal quality was mildly hoarse with mildly reduced vocal intensity which together reduced speech intelligibility during conversation. However, breath support was improved compared to prior sessions. Pt required min-mod cues to increase breath support. It is recommended that PMSV be used with all therapies and SLP will continue to follow pt.    HPI HPI: Pt is a 44 y/o male admitted 12/14 with COVID pneumonia leading to ARDS and needing mechanical ventilation, pneumomediastinum. He was intubated 12/26-12/29 with trach placement on latter date. PEG tube done on 09/11/19 and replaced on 10/14/19 after dislodging. Decannulated from VV ECMO on 3/4 after 65 days. SLP was consulted for possible PMSV placement. Pt downsized to #6 distal cuffless on 10/22/19      SLP Plan  Continue with current plan of care;New goals to be determined pending instrumental study       Recommendations  Diet recommendations: NPO Medication Administration: Via alternative means      Patient may use Passy-Muir Speech Valve: During all therapies with supervision PMSV Supervision: Full         Oral Care Recommendations: Oral care QID Follow up Recommendations: Inpatient Rehab SLP Visit Diagnosis: Aphonia (R49.1) Plan: Continue with current plan of  care;New goals to be determined pending instrumental study       [name] I. Hardin Negus, [place], [place] Office number [phone] Pager [place] 10/23/2019, 11:45 AM

## 2019-10-23 NOTE — TOC Progression Note (Signed)
Transition of Care [facility]) - Progression Note    Patient Details  Name: [name] MRN: [id number] Date of Birth: 04-Feb-1976  Transition of Care [facility]) CM/SW Contact  Graves-Bigelow, Ocie Cornfield, RN Phone Number: 10/23/2019, 2:13 PM  Clinical Narrative:  Case Manager received call from wife regarding needing assistance for disability application. Case Manager was able to provide directions in regards to filling out forms. Case Manager Butch Penny from [employer] called this morning regarding patient's progression of care. If the covering Case Manager needs additional assistance from [place] her number is [phone] ex [phone]. Plan will be to progress patient towards being able to transition to CIR. Case Manager will continue to follow for additional transition of care needs.      Expected Discharge Plan: IP Rehab Facility Barriers to Discharge: Continued Medical Work up(Continues on IV Rocephin, Nimbex and Precedex.)  Expected Discharge Plan and Services Expected Discharge Plan: [place]   Discharge Planning Services: CM Consult   Living arrangements for the past 2 months: Single Family Home                   Readmission Risk Interventions No flowsheet data found.

## 2019-10-23 NOTE — Evaluation (Signed)
Clinical/Bedside Swallow Evaluation Patient Details  Name: [name] MRN: [id number] Date of Birth: 1976/05/23  Today's Date: 10/23/2019 Time: SLP Start Time (ACUTE ONLY): 1000 SLP Stop Time (ACUTE ONLY): 1015 SLP Time Calculation (min) (ACUTE ONLY): 15 min  Past Medical History:  Past Medical History:  Diagnosis Date  . Asthma   . GERD (gastroesophageal reflux disease)   . Headache   . History of kidney stones    LEFT URETERAL STONE  . HTN (hypertension)   . Pancreatitis 2018   GALLBALDDER SLUDGE CAUSED ISSUED RESOLVED   Past Surgical History:  Past Surgical History:  Procedure Laterality Date  . CANNULATION FOR ECMO (EXTRACORPOREAL MEMBRANE OXYGENATION) N/A 08/28/2019   Procedure: CANNULATION FOR VV ECMO (EXTRACORPOREAL MEMBRANE OXYGENATION);  Surgeon: Prescott Gum, Collier Salina, MD;  Location: [place];  Service: Open Heart Surgery;  Laterality: N/A;  CRESCENT CANNULA  . CANNULATION FOR ECMO (EXTRACORPOREAL MEMBRANE OXYGENATION) N/A 09/10/2019   Procedure: CANNULATION FOR ECMO (EXTRACORPOREAL MEMBRANE OXYGENATION) PUTTING IN CRESCENT 32FR CANNULA  AND REMOVING GROING CANNULATION;  Surgeon: Wonda Olds, MD;  Location: [place];  Service: Open Heart Surgery;  Laterality: N/A;  PUTTING IN CRESCENT/REMOVING GROIN CANNULATION  . CYSTOSCOPY/URETEROSCOPY/HOLMIUM LASER/STENT PLACEMENT Left 04/18/2019   Procedure: LEFT URETEROSCOPY/HOLMIUM LASER/STENT PLACEMENT;  Surgeon: Ardis Hughs, MD;  Location: [facility];  Service: Urology;  Laterality: Left;  . CYSTOSCOPY/URETEROSCOPY/HOLMIUM LASER/STENT PLACEMENT Left 05/02/2019   Procedure: CYSTOSCOPY/URETEROSCOPY/HOLMIUM LASER/STENT EXCHANGE;  Surgeon: Ardis Hughs, MD;  Location: [facility];  Service: Urology;  Laterality: Left;  . ECMO CANNULATION N/A 08/03/2019   Procedure: ECMO CANNULATION;  Surgeon: Wonda Olds, MD;  Location: [place] [facility];  Service: Cardiovascular;  Laterality: N/A;  . ESOPHAGOGASTRODUODENOSCOPY  N/A 09/11/2019   Procedure: ESOPHAGOGASTRODUODENOSCOPY (EGD);  Surgeon: Wonda Olds, MD;  Location: [place];  Service: Thoracic;  Laterality: N/A;  . IR [place] Vivianne Master  10/14/2019  . IRRIGATION AND DEBRIDEMENT SHOULDER Right 09/29/2017   Procedure: IRRIGATION AND DEBRIDEMENT SHOULDER;  Surgeon: Leandrew Koyanagi, MD;  Location: [place];  Service: Orthopedics;  Laterality: Right;  . [place] SURGERY  2002  . NASAL ENDOSCOPY WITH EPISTAXIS CONTROL N/A 08/31/2019   Procedure: NASAL ENDOSCOPY WITH EPISTAXIS CONTROL WITH CAUTERIZATION;  Surgeon: Melida Quitter, MD;  Location: [place];  Service: ENT;  Laterality: N/A;  . PORTACATH PLACEMENT N/A 09/11/2019   Procedure: PEG TUBE INSERTION - BEDSIDE;  Surgeon: Wonda Olds, MD;  Location: [place];  Service: Thoracic;  Laterality: N/A;  . TEE WITHOUT CARDIOVERSION N/A 08/28/2019   Procedure: TRANSESOPHAGEAL ECHOCARDIOGRAM (TEE);  Surgeon: Prescott Gum, Collier Salina, MD;  Location: [place];  Service: Open Heart Surgery;  Laterality: N/A;  . TEE WITHOUT CARDIOVERSION N/A 09/10/2019   Procedure: TRANSESOPHAGEAL ECHOCARDIOGRAM (TEE);  Surgeon: Wonda Olds, MD;  Location: [place];  Service: Open Heart Surgery;  Laterality: N/A;   HPI:  Pt is a 44 y/o male admitted 12/14 with COVID pneumonia leading to ARDS and needing mechanical ventilation, pneumomediastinum. He was intubated 12/26-12/29 with trach placement on latter date. PEG tube done on 09/11/19 and replaced on 10/14/19 after dislodging. Decannulated from VV ECMO on 3/4 after 65 days. SLP was consulted for possible PMSV placement. Pt downsized to #6 distal cuffless.    Assessment / Plan / Recommendation Clinical Impression  Pt was seen for bedside swallow evaluation with Passy-Muir Speaking Valve (PMSV) in place. His vitals remained stable with ranges RR 27-29 HR 111-116 SpO2 92-93%. Oral mechanism exam was  within normal limits. He tolerated individual ice chips, thin liquids via cup and tsp, and 1/2  tsp boluses of puree without overt s/sx of aspiration. He exhibited coughing with full-tsp boluses of puree and with thin liquids via straw, suggesting aspiration. It is recommended that the pt's NPO status be maintained and that a fiberoptic endoscopic evaluation of swallowing be completed to further assess swallow function. Pt verbalized understanding and agreement with recommendations and plan of care.  SLP Visit Diagnosis: Dysphagia, pharyngeal phase (R13.13)    Aspiration Risk  Mild aspiration risk;Moderate aspiration risk    Diet Recommendation NPO;Alternative means - temporary   Medication Administration: Via alternative means    Other  Recommendations Oral Care Recommendations: Oral care BID   Follow up Recommendations Inpatient Rehab      Frequency and Duration min 2x/week  2 weeks       Prognosis Prognosis for Safe Diet Advancement: Good Barriers to Reach Goals: Severity of deficits;Time post onset      Swallow Study   General Date of Onset: 08/05/20 HPI: Pt is a 44 y/o male admitted 12/14 with COVID pneumonia leading to ARDS and needing mechanical ventilation, pneumomediastinum. He was intubated 12/26-12/29 with trach placement on latter date. PEG tube done on 09/11/19 and replaced on 10/14/19 after dislodging. Decannulated from VV ECMO on 3/4 after 65 days. SLP was consulted for possible PMSV placement. Pt downsized to #6 distal cuffless.  Type of Study: Bedside Swallow Evaluation Previous Swallow Assessment: None Diet Prior to this Study: NPO;PEG tube Temperature Spikes Noted: No Respiratory Status: Trach Collar History of Recent Intubation: Yes Length of Intubations (days): 3 days Date extubated: 08/05/20 Behavior/Cognition: Alert;Cooperative;Pleasant mood Oral Cavity Assessment: Within Functional Limits Oral Care Completed by SLP: No Oral Cavity - Dentition: Adequate natural dentition Vision: Functional for self-feeding Self-Feeding Abilities: Total assist Patient  Positioning: Upright in bed;Postural control adequate for testing Baseline Vocal Quality: Normal Volitional Cough: Strong Volitional Swallow: Able to elicit    Oral/Motor/Sensory Function Overall Oral Motor/Sensory Function: Within functional limits   Ice Chips Ice chips: Within functional limits Presentation: Spoon   Thin Liquid Thin Liquid: Impaired Presentation: Cup;Straw;Spoon Pharyngeal  Phase Impairments: Cough - Delayed(With thin liquids via straw)    Nectar Thick Nectar Thick Liquid: Not tested   Honey Thick Honey Thick Liquid: Not tested   Puree Puree: Impaired Presentation: Spoon Pharyngeal Phase Impairments: Cough - Delayed(Suspect possible aspiration of pharyngeal residue with full-tsp boluses )   Solid     Solid: Not tested     Zanya Lindo I. Hardin Negus, [place], [place] Office number [phone] Pager [place] 10/23/2019,11:24 AM

## 2019-10-23 NOTE — Progress Notes (Signed)
NAME:  [name], MRN:  [id number], DOB:  1975/10/05, LOS: 93 ADMISSION DATE:  07/22/2019, CONSULTATION DATE:  12/24 REFERRING MD:  Sloan Leiter, CHIEF COMPLAINT:  Dyspnea   Brief History   44 y/o male admitted 12/14 with COVID pneumonia leading to ARDS, pneumomediastinum.  Intubated 12/26, bilateral chest tubes placed.  Past Medical History  GERD HTN Asthma  Significant Hospital Events   12/14 admit with hypoxemic respiratory failure in setting of COVID-19 pneumonia 12/23 noted to have pneumomediastinum on chest x-ray 12/24PCCM consulted for evaluation 12/26 worsening hypoxemia, intubated 12/27 VV fem/fem ECMO cannulation 12/29 tracheostomy >> See bedside nursing event notes for full rundown of procedures after 12/27 1/5 epistaxis, nasal packing 1/30 improved cxr, improved lung compliance, TV 350s on 15PC  1/31 CXR increased infiltrates, +CFB     2/2 Converted to right IJ Crescent cannula. 2/3 PEG tube inserted at bedside 2/14 30 min sweep trial> paO2 105 (18 PEEP, 60% FiO2) pH 7.22, pCO2 87 2/20 2 hours sweep trial 2/21 1.5 hour sweep trial 2/24 7-hour sweep trial.  Increasingly awake. 3/2 - decannulation attempt aborted due to increased hypercapnia when sedated for procedure 3/4- dcannulated 3/8 PEG tube repositioned by IR 3/9 >> trach wean 3/14 started 24/7 TC  Consults:  PCCM, CTS, CHF, Palliative  Procedures:  PICC 1/3 >> R radial a-line 1/20 >> R IJ ECMO cath 2/3 >> Tracheostomy 12/29 >> 10/14/2019 PEG tube replacement>>  Significant Diagnostic Tests:  CT chest 12/22>>extensive pneumomediastinum, multifocal patchy bilateral groundglass opacities CXR 1/25 >> improving bilateral infiltrates. ECMO cannulae in place. CXR 2/5 >> clearing bilateral infiltrates  Micro Data:  BCx2 12/14 >>negative  Sputum 12/15 >> OPF 12/28 blood > negative 12/29 > staph aureus 1/4 bronch:  1/2 acid fast smear bronch: negative 1/2 fungal cx bronch: negative1/12 BAL:  serratia, staph epidermidis 1/4 BAL > MSSA, Serratia 1/9 blood > negative 1/9 Resp culture > MSSA, serratia 1/11 serratia > negative 1/12 fungus culture > negative 1/12 BAL > serratia 1/18 BAL >> rare Serratia. 1/24 blood > negative 1/30 blood > negative 1/30 resp culture > serratia 2/1 BAL >> rare Serratia - enterococcus 2/1 aspergillus Ag > negative 2/2 resp culture > serratia  2/2 blood > negative 2/5 urine > negative 2/9 resp: serratia 2/15 BAL serratia 3/5 blood cultures ngtd  Antimicrobials:  Received remdesivir, steroids, actemra and convalescent plasma  See MAR for remainder  Interim history/subjective:  Still sleeping poorly. In poor spirits today.  Objective   Blood pressure (!) 149/79, pulse (!) 109, temperature 98.6 F (37 C), temperature source Oral, resp. rate (!) 22, height _0  (1.727 m), weight 123.6 kg, SpO2 99 %.    FiO2 (%):  [28 %-40 %] 28 %   Intake/Output Summary (Last 24 hours) at 10/23/2019 1305 Last data filed at 10/23/2019 0630 Gross per 24 hour  Intake 1424 ml  Output 1503 ml  Net -79 ml   Filed Weights   10/20/19 0400 10/22/19 0600 10/23/19 0500  Weight: 128 kg 123.8 kg 123.6 kg    Examination: GEN: chronically ill appearing HEENT: MMM, trach in place, minimal secretions CV: RRR, ext warm PULM: Diminished bases, scattered rhonci GI: PEG in place, soft, +BS EXT: global anasarca NEURO: Moves all 4 ext to command PSYCH: RASS 0  SKIN: Slightly pale, multiple pressure wounds, eschar on lateral feet stable  ABG    Component Value Date/Time   PHART 7.384 10/10/2019 1817   PCO2ART 63.7 (H) 10/10/2019 1817   PO2ART 76.0 (L)  10/10/2019 1817   HCO3 38.0 (H) 10/10/2019 1817   TCO2 40 (H) 10/10/2019 1817   ACIDBASEDEF 4.0 (H) 09/16/2019 2334   O2SAT 84.5 10/20/2019 0500     Resolved Hospital Problem list   Pneumothoraces-resolved.  Dislodged PEG tube Indurated area of the skin right lateral PEG tube.  Serratia pneumonia>  treated.   Assessment & Plan:   ARDS due to COVID 19, now requiring mechanical ventilation support at night Decannulated from VV ECMO on 3/4 after 65 days. Tracheostomy tube in place Changed to 6 distal XLT 3/16 - Continue indefinite TC - PMV with SLP - Suctioning PRN  AKI with azotemia - improving  Positive cumulative fluid balance Hypernatremia has corrected Severe third spacing -diuresis per CHF team  Hypertension - tapering oral antihypertensive - stable at this time   Neuromuscular weakness - OOB to chair as much as tolerated  - IPR consult/eval pending   Bilateral lower feet deep tissue injury Wound care continued for feet  Anemia due to critical illnes Conservative transfusion threshold  Insomnia- on qHS seroquel, melatonin, PRN ativan, trazodone, citalopram added today  Best practice:  Pain/Anxiety/Delirium protocol (if indicated): - Dilaudid 49m q6h PRN - Precedex- clonidine taper - Valium 2.534mBID PRN - Citalopram 1064maily - Trazodone 100m32mS - PRN ativan qHS - Seroquel 50mg25m (increased today) - Melatonin started today  VAP protocol (if indicated): Bundle in place. Respiratory support goals: Trach collar trial DVT prophylaxis: Lovenox GI prophylaxis: PPI Nutrition: TF Fluid status goals:  See above Urinary catheter: external urinary catheter Glucose control: doing well with SSI alone Mobility/therapy needs: up with PT.  Daily labs: As needed Code Status: Full code  Disposition: to be determined Family: updated when at bedside  Labs   CBC: CBC Latest Ref Rng & Units 10/23/2019 10/22/2019 10/21/2019  WBC 4.0 - 10.5 K/uL 7.6 5.8 6.0  Hemoglobin 13.0 - 17.0 g/dL 10.8(L) 9.9(L) 9.9(L)  Hematocrit 39.0 - 52.0 % 36.8(L) 34.3(L) 35.0(L)  Platelets 150 - 400 K/uL 244 249 249     Basic Metabolic Panel: BMP Latest Ref Rng & Units 10/23/2019 10/22/2019 10/21/2019  Glucose 70 - 99 mg/dL 97 102(H) 92  BUN 6 - 20 mg/dL 47(H) 48(H) 38(H)   Creatinine 0.61 - 1.24 mg/dL 0.54(L) 0.50(L) 0.33(L)  Sodium 135 - 145 mmol/L 142 142 142  Potassium 3.5 - 5.1 mmol/L 3.7 4.0 3.8  Chloride 98 - 111 mmol/L 103 104 101  CO2 22 - 32 mmol/L _0 Calcium 8.9 - 10.3 mg/dL 9.1 8.8(L) 8.9     GFR: Estimated Creatinine Clearance: 149.3 mL/min (A) (by C-G formula based on SCr of 0.54 mg/dL (L)). Recent Labs  Lab 10/20/19 0401 10/21/19 0440 10/22/19 0358 10/23/19 0440  WBC 5.7 6.0 5.8 7.6    Liver Function Tests: No results for input(s): AST, ALT, ALKPHOS, BILITOT, PROT, ALBUMIN in the last 168 hours. No results for input(s): LIPASE, AMYLASE in the last 168 hours. No results for input(s): AMMONIA in the last 168 hours.  ABG    Component Value Date/Time   PHART 7.384 10/10/2019 1817   PCO2ART 63.7 (H) 10/10/2019 1817   PO2ART 76.0 (L) 10/10/2019 1817   HCO3 38.0 (H) 10/10/2019 1817   TCO2 40 (H) 10/10/2019 1817   ACIDBASEDEF 4.0 (H) 09/16/2019 2334   O2SAT 84.5 10/20/2019 0500     Coagulation Profile: No results for input(s): INR, PROTIME in the last 168 hours.  Cardiac Enzymes: No results for input(s): CKTOTAL, CKMB, CKMBINDEX,  TROPONINI in the last 168 hours.  HbA1C: Hgb A1c MFr Bld  Date/Time Value Ref Range Status  09/23/2019 08:58 AM 5.5 4.8 - 5.6 % Final    Comment:    (NOTE) Pre diabetes:          5.7%-6.4% Diabetes:              >6.4% Glycemic control for   <7.0% adults with diabetes   07/24/2019 04:50 AM 6.7 (H) 4.8 - 5.6 % Final    Comment:    (NOTE) Pre diabetes:          5.7%-6.4% Diabetes:              >6.4% Glycemic control for   <7.0% adults with diabetes     CBG: Recent Labs  Lab 10/22/19 2051 10/23/19 0039 10/23/19 0442 10/23/19 0736 10/23/19 1122  GLUCAP 85 155* 88 128* [place], MD [facility] 10/23/2019 1:05 PM

## 2019-10-23 NOTE — Progress Notes (Signed)
Chaplain engaged in follow-up visit with [name]  During visit, [name] expressed feeling tired, anxious, and stressed.  [name] noted that all he wants to do is rest and that he has not been able to do that. Chaplain affirmed his need to rest and the impact that must have on his body and mind.  [name] stated that the doctor was working on helping him get some sleep.  [name] also expressed some worry about his family.  Missing his son's birthday and Christmas was really hard for him.  Chaplain affirmed how tough it is to miss integral moments while uplifting that [name] has the opportunity now to experience birthdays and holidays in the future.  [name] also expressed wanting to rededicate his life to [place].  He tearfully stated things that he believes he has done wrong in the past.  Chaplain prayed a prayer of forgiveness and renewal over [name], expressing that God loves him and that he has to believe that for himself.    [name] stated he wants to get back in the word of God.  Chaplain will work to read scripture to [name] as well as speak with his mother or visitors about meeting that need as well.  Chaplain will continue to follow-up.

## 2019-10-23 NOTE — Progress Notes (Signed)
I updated patients wife and mother via phone. Pt was able to speak to wife on phone. Patient up in chair with lift. Valinda Party

## 2019-10-23 NOTE — Progress Notes (Signed)
Occupational Therapy Treatment Patient Details Name: [name] MRN: [id number] DOB: [birth year] Today's Date: 10/23/2019    History of present illness 44 y/o M admitted 12/14 with COVID pneumonia causing acute hypoxemic respiratory failure.   Developed pneumomediastinum 12/23 with concern for possible bilateral small pneumothoraces on 12/24. Pt intubated on 12/26 and started on ECMO on 12/27. Tracheostomy on 12/29 with bleeding from trach an dother sites on 12/31. Bronch on 1/11 and 1/15. Slowly weaning sedation 2/5. Continuing to wean sedation and starting sweep trials to wean ECMO - Decannulated 3/4   OT comments  Pt continues to make steady progress. Pt able to maintain unsupported sitting upright in recliner for short periods, demonstrating significant increase in core /trunk control.  BUE shoulder flexion/elbow flexion weakness continues, decreasing functional use of BUE, however, increasing strength in extensor groups with the exception of wrist drop with R hand. Will most likely benefit from use of R wrist cock up splint.  Pt appears to demonstrate symptoms consistent with B shoulder impingement, which improves with gentle humeral distraction and scapular stability in downward rotation. Pt able to maintain grasp on cloth to wash face using L hand. Pt more interactive and conversational (PMSV used during session) with VSS. Pt expressing anxiety/sense of loss regarding not remembering what has happened in the last 3 months of his life. Will begin work on ICU journal to help pt cope with anxiety regarding "lost time" and better understand events of his hospitalization. Will continue to follow acutely.   Follow Up Recommendations  CIR;Supervision/Assistance - 24 hour    Equipment Recommendations  Other (comment)(TBA)    Recommendations for Other Services      Precautions / Restrictions Precautions Precautions: Fall;Other (comment) Precaution Comments: trach collar Required Braces or  Orthoses: Other Brace Other Brace: B prevalon boots       Mobility Bed Mobility Overal bed mobility: Needs Assistance   Rolling: Max assist;+2 for physical assistance         General bed mobility comments: Pt able to assist wtih rolling; facilitate movement by initiating with head turn then reaching with BUE; Assist to flex knee and push through foot  Transfers                 General transfer comment: Maxisky used to help nsg lift pt back to bed    Balance                                           ADL either performed or assessed with clinical judgement   ADL Overall ADL's : Needs assistance/impaired     Grooming: Maximal assistance;Wash/dry face;Oral care                               Functional mobility during ADLs: Total assistance(Maxisky)       Vision       Perception     Praxis      Cognition Arousal/Alertness: Awake/alert Behavior During Therapy: WFL for tasks assessed/performed Overall Cognitive Status: Impaired/Different from baseline Area of Impairment: Memory                   Current Attention Level: Selective Memory: (will further assess; appaernt memory deficits)     Awareness: Emergent            Exercises Exercises: General  Upper Extremity General Exercises - Upper Extremity Shoulder Flexion: AAROM;Both;PROM;10 reps;Seated Shoulder Extension: AAROM;AROM;Both;10 reps;Seated Shoulder ADduction: AROM;AAROM;Both;10 reps;Seated Shoulder Horizontal ADduction: Strengthening;AAROM;AROM;10 reps;Seated Elbow Flexion: PROM;AAROM;Both;10 reps;Seated Elbow Extension: AAROM;AROM;Strengthening;Both;15 reps;Seated Wrist Flexion: AAROM;AROM;Both;PROM;10 reps;Seated Wrist Extension: PROM;AROM;AAROM;Both;10 reps(No active extension R wrist) Digit Composite Flexion: AAROM;AROM;PROM;Strengthening;Both;10 reps;Seated;Squeeze ball Composite Extension: PROM;AROM;AAROM;Both;10 reps;Seated Other  Exercises Other Exercises: Sheet placed around upper pelvis area adn pulled forward while pt increasing thoracic extension a while rotating to R/L Other Exercises: Place and hold in upright unsupported sitting Other Exercises: Leaning laterally onto B foorearms then pushing into upright sitting - incorporating head movemetns to increase cervical rotation Other Exercises: lift offs B into ER; place and hold then push off into ER to increase ROM Other Exercises: Gentle distraction with FF to reduce discomfort   Shoulder Instructions       General Comments      Pertinent Vitals/ Pain       Pain Assessment: Faces Faces Pain Scale: Hurts little more Pain Location: buttocks; B shoulder - ant with FF/ER Pain Descriptors / Indicators: Aching;Grimacing;Discomfort;Sore Pain Intervention(s): Limited activity within patient's tolerance;Repositioned  Home Living                                          Prior Functioning/Environment              Frequency  Min 3X/week        Progress Toward Goals  OT Goals(current goals can now be found in the care plan section)  Progress towards OT goals: Progressing toward goals  Acute Rehab OT Goals Patient Stated Goal: regain strength and independence OT Goal Formulation: With patient Time For Goal Achievement: 10/30/19 Potential to Achieve Goals: Good ADL Goals Pt Will Perform Upper Body Bathing: with max assist;sitting Additional ADL Goal #1: Pt will maintain midline postural control in supported sitting x 5 min with min guard A in preparation for ADL training Additional ADL Goal #2: Pt will complete sit - stand activity with Max A +2 x 1 min with use of lift equipment  to increase strength for functional mobility Additional ADL Goal #4: Pt will demonstrate B active elbow flexion in gravity eliminated position to increase funcitonal use of arms  Plan Discharge plan remains appropriate    Co-evaluation                  AM-PAC OT "6 Clicks" Daily Activity     Outcome Measure   Help from another person eating meals?: Total Help from another person taking care of personal grooming?: A Lot Help from another person toileting, which includes using toliet, bedpan, or urinal?: Total Help from another person bathing (including washing, rinsing, drying)?: Total Help from another person to put on and taking off regular upper body clothing?: Total Help from another person to put on and taking off regular lower body clothing?: Total 6 Click Score: 7    End of Session Equipment Utilized During Treatment: Oxygen(TC)  OT Visit Diagnosis: Muscle weakness (generalized) (M62.81);Other abnormalities of gait and mobility (R26.89);Other symptoms and signs involving cognitive function;Pain Pain - part of body: (shoulders; buttocks)   Activity Tolerance Patient tolerated treatment well   Patient Left in bed;with call bell/phone within reach;with bed alarm set;with nursing/sitter in room   Nurse Communication Mobility status;Need for lift equipment;Other (comment)(positioning to decrease B hand edema adn B hip ER)  Time: 9872-1587 OT Time Calculation (min): 54 min  Charges: OT General Charges $OT Visit: 1 Visit OT Treatments $Self Care/Home Management : 8-22 mins $Therapeutic Activity: 8-22 mins $Neuromuscular Re-education: 23-37 mins  Maurie Boettcher, OT/L   Acute OT Clinical Specialist [place] Pager [phone] Office [phone]    [facility] 10/23/2019, 6:17 PM

## 2019-10-23 NOTE — Progress Notes (Addendum)
 Patient ID: [name], male   DOB: 02/14/1976, 44 y.o.   MRN: [id number]    Advanced Heart Failure Rounding Note   Subjective:    Having a hard time sleeping. Scared to go to sleep. Denies SOB   Objective:   Weight Range:  Vital Signs:   Temp:  [98 F (36.7 C)-99 F (37.2 C)] 99 F (37.2 C) (03/17 0740) Pulse Rate:  [97-119] 109 (03/17 0800) Resp:  [18-30] 20 (03/17 0800) BP: (115-159)/(59-100) 149/79 (03/17 0800) SpO2:  [88 %-100 %] 99 % (03/17 0800) FiO2 (%):  [28 %-40 %] 28 % (03/17 0752) Weight:  [123.6 kg] 123.6 kg (03/17 0500) Last BM Date: 10/22/19  Weight change: Filed Weights   10/20/19 0400 10/22/19 0600 10/23/19 0500  Weight: 128 kg 123.8 kg 123.6 kg    Intake/Output:   Intake/Output Summary (Last 24 hours) at 10/23/2019 0911 Last data filed at 10/23/2019 0630 Gross per 24 hour  Intake 2181.09 ml  Output 1503 ml  Net 678.09 ml     Physical Exam : General:  In bed on TC with PMV in place No resp difficulty HEENT: normal Neck: on trach collar , supple. no JVD. Carotids 2+ bilat; no bruits. No lymphadenopathy or thryomegaly appreciated. Cor: PMI nondisplaced. Regular rate & rhythm. No rubs, gallops or murmurs. Lungs: clear no wheeze Abdomen: obese soft, nontender, nondistended. No hepatosplenomegaly. No bruits or masses. Good bowel sounds. + PEG  Extremities: no cyanosis, clubbing, rash, edema R and LLE with eschar. RUE PICC Neuro: alert & orientedx3, cranial nerves grossly intact. UE & LE remain weak . Affect pleasant   Telemetry: SR 80s Personally reviewed   Labs: Basic Metabolic Panel: Recent Labs  Lab 10/19/19 0309 10/19/19 0309 10/20/19 0401 10/20/19 0401 10/21/19 0440 10/22/19 0358 10/23/19 0440  NA 140  --  142  --  142 142 142  K 4.0  --  4.2  --  3.8 4.0 3.7  CL 96*  --  100  --  101 104 103  CO2 36*  --  35*  --  31 29 27  GLUCOSE 79  --  82  --  92 102* 97  BUN 41*  --  42*  --  38* 48* 47*  CREATININE 0.45*  --  0.49*   --  0.33* 0.50* 0.54*  CALCIUM 8.4*   < > 8.9   < > 8.9 8.8* 9.1  MG 2.0  --  2.1  --  1.9 1.9 1.8  PHOS 2.4*  --  2.8  --  3.2 3.2 3.5   < > = values in this interval not displayed.    Liver Function Tests: No results for input(s): AST, ALT, ALKPHOS, BILITOT, PROT, ALBUMIN in the last 168 hours. No results for input(s): LIPASE, AMYLASE in the last 168 hours. No results for input(s): AMMONIA in the last 168 hours.  CBC: Recent Labs  Lab 10/19/19 0309 10/20/19 0401 10/21/19 0440 10/22/19 0358 10/23/19 0440  WBC 5.1 5.7 6.0 5.8 7.6  HGB 8.7* 9.1* 9.9* 9.9* 10.8*  HCT 30.2* 31.9* 35.0* 34.3* 36.8*  MCV 95.6 96.7 97.0 95.0 93.4  PLT 243 250 249 249 244    Cardiac Enzymes: No results for input(s): CKTOTAL, CKMB, CKMBINDEX, TROPONINI in the last 168 hours.  BNP: BNP (last 3 results) Recent Labs    07/22/19 1039  BNP 15.3    ProBNP (last 3 results) No results for input(s): PROBNP in the last 8760 hours.      Other results:  Imaging: DG CHEST PORT 1 VIEW  Result Date: 10/22/2019 CLINICAL DATA:  Trach, respiratory failure EXAM: PORTABLE CHEST 1 VIEW COMPARISON:  10/21/2019 FINDINGS: Tracheostomy tube is unchanged. Low lung volumes. Severe diffuse bilateral airspace disease, unchanged. Cardiomegaly. No visible effusions or acute bony abnormality. IMPRESSION: Cardiomegaly.  Severe diffuse bilateral airspace disease, unchanged. Electronically Signed   By: Kevin  Dover M.D.   On: 10/22/2019 09:17     Medications:     Scheduled Medications: . acetaminophen (TYLENOL) oral liquid 160 mg/5 mL  650 mg Per Tube Q6H  . amLODipine  5 mg Per Tube Daily  . vitamin C  500 mg Per Tube Daily  . carvedilol  6.25 mg Oral BID WC  . chlorhexidine gluconate (MEDLINE KIT)  15 mL Mouth Rinse BID  . Chlorhexidine Gluconate Cloth  6 each Topical Daily  . cloNIDine  0.1 mg Oral QID   Followed by  . [START ON 10/24/2019] cloNIDine  0.1 mg Oral Q12H  . diatrizoate meglumine-sodium  30 mL Oral  Once  . enoxaparin (LOVENOX) injection  60 mg Subcutaneous Q24H  . feeding supplement (OSMOLITE 1.5 CAL)  237 mL Per Tube 6 X Daily  . feeding supplement (PRO-STAT SUGAR FREE 64)  60 mL Per Tube QID  . free water  150 mL Per Tube Q6H  . Gerhardt's butt cream   Topical QID  . insulin aspart  0-20 Units Subcutaneous Q4H  . ipratropium-albuterol  3 mL Nebulization TID  . magic mouthwash  5 mL Oral TID  . mouth rinse  15 mL Mouth Rinse 10 times per day  . Melatonin  6 mg Per Tube QHS  . montelukast  10 mg Per Tube QHS  . nutrition supplement (JUVEN)  1 packet Per Tube BID BM  . pantoprazole (PROTONIX) IV  40 mg Intravenous BID  . psyllium  1 packet Per Tube BID  . QUEtiapine  50 mg Per Tube QHS  . sodium chloride flush  10-40 mL Intracatheter Q12H    Infusions: . sodium chloride 250 mL (10/08/19 1626)  . sodium chloride Stopped (10/13/19 0548)  . dexmedetomidine (PRECEDEX) IV infusion Stopped (10/22/19 1313)    PRN Medications: sodium chloride, sodium chloride, acetaminophen (TYLENOL) oral liquid 160 mg/5 mL, acetaminophen, albuterol, [COMPLETED] diazepam **FOLLOWED BY** diazepam, diphenoxylate-atropine, hydrALAZINE, HYDROmorphone, magnesium hydroxide, midazolam, ondansetron (ZOFRAN) IV, senna-docusate, sodium chloride, sodium chloride flush   Assessment/Plan:    1. Acute hypoxic/hypercarbic respiratory failure due to COVID PNA: Remained markedly hypoxic despite full support. Intubated 12/26. S/p bilateral CTs 12/26 for pneumothoraces.  TEE on 12/26 LVEF 70% RV ok. VV ECMO begun 12/26. Tracheostomy 12/29.  COVID ARDS at this point. ECMO circuit changed 1/8 & 1/23. Crescent cannula placed 2/2 (femoral cannuals removed) - Decannulated 3/4 - On trach collar with PM valve on. Sats stable.  -  2. COVID PNA: management of COVID infection per CCM. CXR with bilateral multifocal PNA progressing to likely ARDS.  - s/p 10 days of remdesivir - 12/16, 12/2 s/p tociluzimab - 12/17 s/p  convalescent plasma - Decadron at 1mg/daily completed 1/11  3. ID: Empiric coverage with vancomycin/cefepime initially for possible secondary bacterial PNA, course completed.   - Completed meropenem   4. Anemia: - hgb 10.8   5. FEN - Remains on TFs. Stable  5. Hypernatremia -Resolved. Na 142 -Continue free water at current dose  6. AKI - Resolved.    7. DTIs - On both feet.  Black eschar.   8.  Hypokalemia. -  K 3.7   9.  Diffuse muscle weakness. -This is consistent with ICU/steroid/paralytics myopathy.  Continue passive range of motion exercises with PT.   -PT and OT are following.  Both have recommended CIR.  CIR has seen and feel he is not ready yet.  -PT/OT will help provide additional recommendations for staff RN   10. HTN Elevated.   Length of Stay: 22  Amy Clegg NP-C  10/23/2019, 9:11 AM  Advanced Heart Failure Team Pager [phone] (M-F; 7a - 4p)  Please contact [facility] for night-coverage after hours (4p -7a ) and weekends on [email or web address]  Patient seen and examined with the above-signed Advanced Practice Provider and/or Housestaff. I personally reviewed laboratory data, imaging studies and relevant notes. I independently examined the patient and formulated the important aspects of the plan. I have edited the note to reflect any of my changes or salient points. I have personally discussed the plan with the patient and/or family.  He remains very weak. Unable to sleep. Denies SOB, orthopnea or PND. Volume status ok. DTIs healing well.   I have spoken to PT personally. Unable to see him 2x/day but have given [place] exercises to do with him to help him progress. Also d/w CIR tream who will re-evaluate later in the week or next week.   Will titrate sedation regimen at night. As wellas anti-HTN regimen. DTIs improving. Continue to wean trach.  Glori Bickers, MD  1:37 PM

## 2019-10-23 NOTE — Progress Notes (Signed)
Nutrition Follow up  DOCUMENTATION CODES:   Obesity unspecified  INTERVENTION:   Once diet advanced pt will need feeding assistance with each meal  Continue bolus feedings:  -1 can Osmolite 1.5 six times daily (0000, 0400, 0800, 1200, 1600, 2000) -60 ml Prostat QID -Juven BID -Free water flushes 150 ml Q6 hours- per CCM  Provides: 3090 kcals, 214 grams protein, 1086 ml free water (1686 ml with flushes). Meets 100% of needs.   NUTRITION DIAGNOSIS:   Increased nutrient needs related to acute illness(COVID PNA) as evidenced by estimated needs.  Ongoing  GOAL:   Patient will meet greater than or equal to 90% of their needs   Addressed via TF  MONITOR:   Diet advancement, Skin, TF tolerance, Weight trends, Labs, I & O's  REASON FOR ASSESSMENT:   Consult Enteral/tube feeding initiation and management  ASSESSMENT:   Patient with PMH significant for GERD, HTN, and asthma. Presents this admission with COVID-19 PNA.   12/14- admit 12/23- chest xray revealed pneumomediastinum  12/24- developed bilateral pneumothoraces  12/26- worsening hypoxemia, intubated  12/27- s/p VV fem/fem ECMO cannulation  12/29- s/p trach  1/5- nasal bleeding- packing per CCM 1/7- nasal packing per ENT 1/8- trach exchanged, ECMO circuit changed  1/20- s/p additional RIJ drainage cannula  1/21- Cortrak kinked, replaced by OGT, pt vomited over night 1/22- ECMO circuit exchanged  1/23- nasal endoscopy, control of epistaxis  2/2- TEE, Cortrak removed, change to crescent catheter, removal of femoral lines 2/3- s/p PEG, EGD 2/15- developed recurrent PNA/sepsis  3/2- decannulation cancelled 3/4- s/p decannulation VV ECMO 3/7- PEG dislodged  3/8- s/p PEG replacement by IR   Pt now tolerating trach collar at night. Weaned off precedex. Rectal tube pulled out yesterday. Wound stable. SLP at bedside during RD follow up. Pt tolerated bites of pudding. Plan for MBS tomorrow. Continue current bolus  regimen, will plan to wean slowly off TF as PO intake progresses.   Admission weight: 136.1 kg  Current weight: 123.6 kg (down 5 kg in last two days)  I/O: -8,115 ml since 3/3 UOP: 2,426 ml x 24 hrs   Medications: 500 mg Vitamin C, SS novolog, metamucil Labs: CBG 71-164  Diet Order:   Diet Order            Diet NPO time specified  Diet effective midnight              EDUCATION NEEDS:   Not appropriate for education at this time  Skin:  Skin Assessment: Skin Integrity Issues: Skin Integrity Issues:: DTI, Stage II, Stage III DTI: bilateral heels Stage II: buttocks Stage III: n/a Incisions: L wrist, neck Other: n/a  Last BM:  3/16  Height:   Ht Readings from Last 1 Encounters:  08/09/19 5' 8"  (1.727 m)    Weight:   Wt Readings from Last 1 Encounters:  10/23/19 123.6 kg    Ideal Body Weight:  70 kg  BMI:  Body mass index is 41.43 kg/m.  Estimated Nutritional Needs:   Kcal:  1443-1540 kcal (25-30 kcal/kg)  Protein:  180-215 grams  Fluid:  >/= 2.5 L/day   [name] RD, LDN Clinical Nutrition Pager # - [phone]

## 2019-10-23 NOTE — Progress Notes (Signed)
Inpatient Rehabilitation Admissions Coordinator  I met with patient at bedside with SLP utilizing his PMSV to discuss his rehab option of CIR potentially as he progresses with therapy. Patient asked appropriate questions. I will contact his wife to further discuss and follow his progress daily.  Danne [name], RN, MSN Rehab Admissions Coordinator [phone] 10/23/2019 10:39 AM

## 2019-10-24 LAB — GLUCOSE, CAPILLARY
Glucose-Capillary: 114 mg/dL — ABNORMAL HIGH (ref 70–99)
Glucose-Capillary: 130 mg/dL — ABNORMAL HIGH (ref 70–99)
Glucose-Capillary: 132 mg/dL — ABNORMAL HIGH (ref 70–99)
Glucose-Capillary: 137 mg/dL — ABNORMAL HIGH (ref 70–99)
Glucose-Capillary: 84 mg/dL (ref 70–99)
Glucose-Capillary: 87 mg/dL (ref 70–99)
Glucose-Capillary: 97 mg/dL (ref 70–99)

## 2019-10-24 LAB — BASIC METABOLIC PANEL
Anion gap: 10 (ref 5–15)
BUN: 40 mg/dL — ABNORMAL HIGH (ref 6–20)
CO2: 29 mmol/L (ref 22–32)
Calcium: 9 mg/dL (ref 8.9–10.3)
Chloride: 105 mmol/L (ref 98–111)
Creatinine, Ser: 0.61 mg/dL (ref 0.61–1.24)
GFR calc Af Amer: >60 mL/min (ref >60)
GFR calc non Af Amer: >60 mL/min (ref >60)
Glucose, Bld: 92 mg/dL (ref 70–99)
Potassium: 4.3 mmol/L (ref 3.5–5.1)
Sodium: 144 mmol/L (ref 135–145)

## 2019-10-24 LAB — CBC
HCT: 36.1 % — ABNORMAL LOW (ref 39.0–52.0)
Hemoglobin: 10.5 g/dL — ABNORMAL LOW (ref 13.0–17.0)
MCH: 27.5 pg (ref 26.0–34.0)
MCHC: 29.1 g/dL — ABNORMAL LOW (ref 30.0–36.0)
MCV: 94.5 fL (ref 80.0–100.0)
Platelets: 236 10*3/uL (ref 150–400)
RBC: 3.82 MIL/uL — ABNORMAL LOW (ref 4.22–5.81)
RDW: 16.6 % — ABNORMAL HIGH (ref 11.5–15.5)
WBC: 7.8 10*3/uL (ref 4.0–10.5)
nRBC: 0 % (ref 0.0–0.2)

## 2019-10-24 LAB — MAGNESIUM: Magnesium: 2.1 mg/dL (ref 1.7–2.4)

## 2019-10-24 LAB — PHOSPHORUS: Phosphorus: 4.7 mg/dL — ABNORMAL HIGH (ref 2.5–4.6)

## 2019-10-24 MED ORDER — PRO-STAT SUGAR FREE PO LIQD
60.0000 mL | Freq: Two times a day (BID) | ORAL | Status: DC
  Administered 2019-10-24 – 2019-10-25 (×2): 60 mL
  Filled 2019-10-24: qty 60

## 2019-10-24 MED ORDER — INSULIN ASPART 100 UNIT/ML ~~LOC~~ SOLN
0.0000 [IU] | SUBCUTANEOUS | Status: DC
  Administered 2019-10-24: 2 [IU] via SUBCUTANEOUS
  Administered 2019-10-24 – 2019-10-25 (×2): 3 [IU] via SUBCUTANEOUS
  Administered 2019-10-26 – 2019-10-29 (×5): 2 [IU] via SUBCUTANEOUS

## 2019-10-24 MED ORDER — OSMOLITE 1.5 CAL PO LIQD
474.0000 mL | Freq: Four times a day (QID) | ORAL | Status: DC
  Administered 2019-10-24 – 2019-10-25 (×4): 474 mL
  Filled 2019-10-24 (×6): qty 474

## 2019-10-24 MED ORDER — CARVEDILOL 12.5 MG PO TABS
12.5000 mg | ORAL_TABLET | Freq: Two times a day (BID) | ORAL | Status: DC
  Administered 2019-10-24 – 2019-10-30 (×13): 12.5 mg
  Filled 2019-10-24 (×12): qty 1

## 2019-10-24 NOTE — Progress Notes (Signed)
Occupational Therapy Treatment Patient Details Name: [name] MRN: [id number] DOB: 02/09/1976 Today's Date: 10/24/2019    History of present illness 44 y/o M admitted 12/14 with COVID pneumonia causing acute hypoxemic respiratory failure.   Developed pneumomediastinum 12/23 with concern for possible bilateral small pneumothoraces on 12/24. Pt intubated on 12/26 and started on ECMO on 12/27. Tracheostomy on 12/29 with bleeding from trach an dother sites on 12/31. Bronch on 1/11 and 1/15. Slowly weaning sedation 2/5. Continuing to wean sedation and starting sweep trials to wean ECMO - Decannulated 3/4   OT comments  Pt seen as co-treat with PT in attempt to progress mobility and weight bear through BLE with use of Regulatory affairs officer. Able to achieve good translation of trunk movement anteriorly with facilitating lateral weight shifts, however, lift equipment does not work to mobilize into standing due to profound weakness. SpO2 desat to 85/85 with use of PMSV on 10L 40% FiO2 during session. Pt seen while on TC and increased O2 to 15 L while working to increase SpO2 to 90. Given progression and ability to participate with OT/PT, Feel pt appropriate for Kregg Tilt bed to increase weight bearing and progress strength and mobility - messaged MD. Discussed with wound care nurse who stated it was K to weight bear via B feet. BUE slowly improving with profound weakness in shoulder and elbow flexion, limiting functional use BUE. Recommend nurse complete HEP and keep B hand elevated on at least 2 pillows to reduce dependent edema. Will continue to follow acutely.   Follow Up Recommendations  CIR;Supervision/Assistance - 24 hour    Equipment Recommendations  Other (comment)(TBA)    Recommendations for Other Services      Precautions / Restrictions Precautions Precautions: Fall;Other (comment)(peg; trach; B foot wounds) Required Braces or Orthoses: Other Brace Other Brace: B prevalon boots        Mobility Bed Mobility Overal bed mobility: Needs Assistance             General bed mobility comments: Scooting to EOB in Egress position. Bed pad using to help scoot hips. Pt weight shifted R/L with facilitation to help therapists  Transfers Overall transfer level: Needs assistance               General transfer comment: Attempted sit  - stand with use of Sarah Plus. BLE blocked to decrease ER adn abduction. Sling pulling forard however unable to stand due to amount of pull on B shoulders. Sarah Plus was used to weight bear through B forearms and have pt shift weight anteriorly    Balance     Sitting balance-Leahy Scale: Poor Sitting balance - Comments: improving core strength. Able to sit unsupported for 5 min using Sarah plus to stabilize BUE on forearms                                   ADL either performed or assessed with clinical judgement   ADL Overall ADL's : Needs assistance/impaired     Grooming: Maximal assistance;Wash/dry face;Oral care                                       Vision       Perception     Praxis      Cognition Arousal/Alertness: Awake/alert Behavior During Therapy: [facility] for tasks assessed/performed  Overall Cognitive Status: Within Functional Limits for tasks assessed                       Memory: Decreased short-term memory; remembered therapist from yesterday         General Comments: improving; awareness and insight appear to be improving; decreased STM        Exercises General Exercises - Upper Extremity Shoulder Flexion: AAROM;PROM;Both;10 reps;Supine Elbow Flexion: AAROM;PROM;Both;10 reps;Seated Elbow Extension: AROM;Strengthening;Both;10 reps;Supine Digit Composite Flexion: AROM;AAROM;PROM;Both;10 reps;Seated Composite Extension: PROM;AROM;AAROM;Strengthening;Both;10 reps;Seated   Shoulder Instructions       General Comments Pt anxious initially about trying to  stand. Pt fatigues very easlily with trunk work in sitting. Appears frustrated as he has increased awareness of the significance of his deficits    Pertinent Vitals/ Pain       Pain Assessment: Faces Faces Pain Scale: Hurts little more Pain Location: buttocks; general discomfort Pain Descriptors / Indicators: Discomfort;Grimacing Pain Intervention(s): Limited activity within patient's tolerance  Home Living                                          Prior Functioning/Environment              Frequency  Min 3X/week        Progress Toward Goals  OT Goals(current goals can now be found in the care plan section)  Progress towards OT goals: Progressing toward goals  Acute Rehab OT Goals Patient Stated Goal: regain strength and independence OT Goal Formulation: With patient Time For Goal Achievement: 10/30/19 Potential to Achieve Goals: Good ADL Goals Additional ADL Goal #1: Pt will maintain midline postural control in supported sitting x 5 min with min guard A in preparation for ADL training Additional ADL Goal #2: Pt will complete sit - stand activity with Max A +2 x 1 min with use of lift equipment  to increase strength for functional mobility Additional ADL Goal #3: Pt will be able to move Lt UE to mouth with mod A Additional ADL Goal #4: Pt will demonstrate B active elbow flexion in gravity eliminated position to increase funcitonal use of arms  Plan Discharge plan remains appropriate    Co-evaluation    PT/OT/SLP Co-Evaluation/Treatment: Yes Reason for Co-Treatment: Complexity of the patient's impairments (multi-system involvement);For patient/therapist safety   OT goals addressed during session: ADL's and self-care;Strengthening/ROM      AM-PAC OT "6 Clicks" Daily Activity     Outcome Measure   Help from another person eating meals?: Total Help from another person taking care of personal grooming?: A Lot Help from another person toileting,  which includes using toliet, bedpan, or urinal?: Total Help from another person bathing (including washing, rinsing, drying)?: Total Help from another person to put on and taking off regular upper body clothing?: Total Help from another person to put on and taking off regular lower body clothing?: Total 6 Click Score: 7    End of Session Equipment Utilized During Treatment: Oxygen(10L 40% FiO2)  OT Visit Diagnosis: Muscle weakness (generalized) (M62.81);Other abnormalities of gait and mobility (R26.89);Other symptoms and signs involving cognitive function;Pain Pain - part of body: (buttocks; general discomfort)   Activity Tolerance Patient tolerated treatment well   Patient Left with call bell/phone within reach;in bed;with nursing/sitter in room;with chair alarm set   Nurse Communication Mobility status;Need for lift equipment;Precautions  Time: 2820-8138 OT Time Calculation (min): 36 min  Charges: OT General Charges $OT Visit: 1 Visit OT Treatments $Therapeutic Activity: 8-22 mins  Maurie Boettcher, OT/L   Acute OT Clinical Specialist [place] Pager [phone] Office [phone]    [facility] 10/24/2019, 12:41 PM

## 2019-10-24 NOTE — Progress Notes (Addendum)
Patient ID: [name], male   DOB: [birth year], 44 y.o.   MRN: [id number]    Advanced Heart Failure Rounding Note   Subjective:   Slept a little better today. Denies SOB.   Objective:   Weight Range:  Vital Signs:   Temp:  [98.2 F (36.8 C)-99.8 F (37.7 C)] 99.8 F (37.7 C) (03/18 0400) Pulse Rate:  [70-111] 102 (03/18 0735) Resp:  [15-30] 22 (03/18 0735) BP: (109-167)/(53-106) 151/75 (03/18 0735) SpO2:  [83 %-100 %] 99 % (03/18 0735) FiO2 (%):  [28 %-60 %] 40 % (03/18 0735) Weight:  [122.3 kg] 122.3 kg (03/18 0500) Last BM Date: 10/23/19  Weight change: Filed Weights   10/22/19 0600 10/23/19 0500 10/24/19 0500  Weight: 123.8 kg 123.6 kg 122.3 kg    Intake/Output:   Intake/Output Summary (Last 24 hours) at 10/24/2019 0837 Last data filed at 10/24/2019 0600 Gross per 24 hour  Intake 2060.96 ml  Output 2000 ml  Net 60.96 ml     Physical Exam : CVP 2  General:  In bed.  No resp difficulty HEENT: normal anicteric  Neck: on trach collar with white secretions. Supple. no JVD. Carotids 2+ bilat; no bruits. No lymphadenopathy or thryomegaly appreciated. Cor: PMI nondisplaced. Regular rate & rhythm. No rubs, gallops or murmurs. Lungs: clear no wheeze  Abdomen: obese soft, nontender, nondistended. No hepatosplenomegaly. No bruits or masses. Good bowel sounds. + Peg  Extremities: no cyanosis, clubbing, rash, tr edema. RUE PICC  Neuro: alert & orientedx3, cranial nerves grossly intact. Weak but moves all 4 extremities  With assistance.  Affect flat Skin: R and L foot black eschar    Telemetry: SR 80s Personally reviewed    Labs: Basic Metabolic Panel: Recent Labs  Lab 10/20/19 0401 10/20/19 0401 10/21/19 0440 10/21/19 0440 10/22/19 0358 10/23/19 0440 10/24/19 0327  NA 142  --  142  --  142 142 144  K 4.2  --  3.8  --  4.0 3.7 4.3  CL 100  --  101  --  104 103 105  CO2 35*  --  31  --  29 27 29   GLUCOSE 82  --  92  --  102* 97 92  BUN 42*  --  38*   --  48* 47* 40*  CREATININE 0.49*  --  0.33*  --  0.50* 0.54* 0.61  CALCIUM 8.9   < > 8.9   < > 8.8* 9.1 9.0  MG 2.1  --  1.9  --  1.9 1.8 2.1  PHOS 2.8  --  3.2  --  3.2 3.5 4.7*   < > = values in this interval not displayed.    Liver Function Tests: No results for input(s): AST, ALT, ALKPHOS, BILITOT, PROT, ALBUMIN in the last 168 hours. No results for input(s): LIPASE, AMYLASE in the last 168 hours. No results for input(s): AMMONIA in the last 168 hours.  CBC: Recent Labs  Lab 10/20/19 0401 10/21/19 0440 10/22/19 0358 10/23/19 0440 10/24/19 0327  WBC 5.7 6.0 5.8 7.6 7.8  HGB 9.1* 9.9* 9.9* 10.8* 10.5*  HCT 31.9* 35.0* 34.3* 36.8* 36.1*  MCV 96.7 97.0 95.0 93.4 94.5  PLT 250 249 249 244 236    Cardiac Enzymes: No results for input(s): CKTOTAL, CKMB, CKMBINDEX, TROPONINI in the last 168 hours.  BNP: BNP (last 3 results) Recent Labs    07/22/19 1039  BNP 15.3    ProBNP (last 3 results) No results for input(s): PROBNP in  the last 8760 hours.    Other results:  Imaging: No results found.   Medications:     Scheduled Medications: . acetaminophen (TYLENOL) oral liquid 160 mg/5 mL  650 mg Per Tube Q6H  . amLODipine  5 mg Per Tube Daily  . vitamin C  500 mg Per Tube Daily  . carvedilol  6.25 mg Per Tube BID WC  . chlorhexidine  15 mL Mouth Rinse BID  . Chlorhexidine Gluconate Cloth  6 each Topical Daily  . clonazepam  1 mg Per Tube QHS  . cloNIDine  0.1 mg Per Tube Q12H  . diatrizoate meglumine-sodium  30 mL Oral Once  . enoxaparin (LOVENOX) injection  60 mg Subcutaneous Q24H  . escitalopram  10 mg Per Tube Daily  . feeding supplement (OSMOLITE 1.5 CAL)  237 mL Per Tube 6 X Daily  . feeding supplement (PRO-STAT SUGAR FREE 64)  60 mL Per Tube QID  . free water  150 mL Per Tube Q6H  . Gerhardt's butt cream   Topical QID  . insulin aspart  0-20 Units Subcutaneous Q4H  . ipratropium-albuterol  3 mL Nebulization TID  . magic mouthwash  5 mL Oral TID  .  mouth rinse  15 mL Mouth Rinse q12n4p  . Melatonin  6 mg Per Tube QHS  . montelukast  10 mg Per Tube QHS  . nutrition supplement (JUVEN)  1 packet Per Tube BID BM  . pantoprazole sodium  40 mg Per Tube BID  . psyllium  1 packet Per Tube BID  . QUEtiapine  50 mg Per Tube QHS  . sodium chloride flush  10-40 mL Intracatheter Q12H  . traZODone  100 mg Per Tube QHS    Infusions: . sodium chloride Stopped (10/13/19 0548)  . dexmedetomidine (PRECEDEX) IV infusion Stopped (10/22/19 1313)    PRN Medications: sodium chloride, acetaminophen (TYLENOL) oral liquid 160 mg/5 mL, acetaminophen, albuterol, [COMPLETED] diazepam **FOLLOWED BY** diazepam, diphenoxylate-atropine, hydrALAZINE, HYDROmorphone, LORazepam **OR** LORazepam, magnesium hydroxide, ondansetron (ZOFRAN) IV, senna-docusate, sodium chloride, sodium chloride flush   Assessment/Plan:    1. Acute hypoxic/hypercarbic respiratory failure due to COVID PNA: Remained markedly hypoxic despite full support. Intubated 12/26. S/p bilateral CTs 12/26 for pneumothoraces.  TEE on 12/26 LVEF 70% RV ok. VV ECMO begun 12/26. Tracheostomy 12/29.  COVID ARDS at this point. ECMO circuit changed 1/8 & 1/23. Crescent cannula placed 2/2 (femoral cannuals removed) - Decannulated 3/4 - On trach collar sats stable.  -  2. COVID PNA: management of COVID infection per CCM. CXR with bilateral multifocal PNA progressing to likely ARDS.  - s/p 10 days of remdesivir - 12/16, 12/2 s/p tociluzimab - 12/17 s/p convalescent plasma - Decadron completed 1/11  3. ID: Empiric coverage with vancomycin/cefepime initially for possible secondary bacterial PNA, course completed.   - Completed meropenem   4. Anemia: - hgb 10.5   5. FEN - Remains on TFs. Stable  5. Hypernatremia -Resolved. -Continue free water.   6. AKI - Resolved.    7. DTIs - On both feet.  Black eschar. Stable.   8.  Hypokalemia. Resolved.   9.  Diffuse muscle weakness. -This is  consistent with ICU/steroid/paralytics myopathy.  Continue passive range of motion exercises with PT.   -PT and OT are following.  Both have recommended CIR.  CIR has seen and feel he is not ready yet.  -PT/OT will help provide additional recommendations for staff RN   10. HTN- -Increase carvedilol to 12.5 mg twice a day -  Continue amlodipine 5 mg may need to increase to 10 mg tomorrow if BP remains high.    Length of Stay: 73  Amy Clegg NP-C  10/24/2019, 8:37 AM  Advanced Heart Failure Team Pager [phone] (M-F; 7a - 4p)  Please contact [facility] for night-coverage after hours (4p -7a ) and weekends on [email or web address]  Patient seen and examined with the above-signed Advanced Practice Provider and/or Housestaff. I personally reviewed laboratory data, imaging studies and relevant notes. I independently examined the patient and formulated the important aspects of the plan. I have edited the note to reflect any of my changes or salient points. I have personally discussed the plan with the patient and/or family.  Slept better with trazodone last night. Also has prn ativan available. Remains on TC. BP running high. Will increase carvedilol. Tolerating TFs well.   Still with severe myopathy but improving slowly. Continue PT and nursing exercises. CIR following.   DTIs healing.   Will continue moiblization work.   Glori Bickers, MD  6:16 PM

## 2019-10-24 NOTE — Consult Note (Signed)
[facility] Nurse wound follow up Wound type:Eschar to bilateral feet  Measurement: right: 20 cm x 6 cm dry blood blister (eschar) Left: 20 cm x 5 cm  Wound OAC:ZYSAY dry eschar from blood blister resolving Drainage (amount, consistency, odor) dry and intact  None noted today.  Periwound:intact Dressing procedure/placement/frequency: Continue betadine and foam dressing.  Will assess every 7-10 days.  Domenic Moras MSN, RN, FNP-BC CWON Wound, Ostomy, Continence Nurse Pager [phone]

## 2019-10-24 NOTE — Progress Notes (Signed)
Pt had a period of desating called to the room suctioned patient changed inner cannula and increased FI02 to 60%.

## 2019-10-24 NOTE — Progress Notes (Signed)
Physical Therapy Treatment Patient Details Name: [name] MRN: [id number] DOB: 21-Oct-1975 Today's Date: 10/24/2019    History of Present Illness 44 y/o M admitted 12/14 with COVID pneumonia causing acute hypoxemic respiratory failure.   Developed pneumomediastinum 12/23 with concern for possible bilateral small pneumothoraces on 12/24. Pt intubated on 12/26 and started on ECMO on 12/27. Tracheostomy on 12/29 with bleeding from trach an dother sites on 12/31. Bronch on 1/11 and 1/15. Slowly weaning sedation 2/5. Continuing to wean sedation and starting sweep trials to wean ECMO - Decannulated 3/4    PT Comments    Pt anxious regarding mobility/standing attempts today. With bed in Egress position, pt required +2 max assist to scoot hip forward to EOB. Pt sat unsupported x 5 minutes using Sarah Plus for BUE support. Unsuccessful attempt at standing using Sarah Plus. Too much pull placed on bilat shoulders without being able to clear bottom from bed in [place]. Pt in bed at end of session with SLP present to complete FEES.    Follow Up Recommendations  CIR;Supervision for mobility/OOB;Supervision/Assistance - 24 hour     Equipment Recommendations  Other (comment)(defer)    Recommendations for Other Services       Precautions / Restrictions Precautions Precautions: Fall;Other (comment) Precaution Comments: PEG, trach collar, bilat foot wounds Required Braces or Orthoses: Other Brace Other Brace: B prevalon boots    Mobility  Bed Mobility Overal bed mobility: Needs Assistance             General bed mobility comments: Scooting to EOB in Egress position, +2 max assist using bed pad. Pt able to actively assist with lateral weight shifts.  Transfers Overall transfer level: Needs assistance               General transfer comment: Attempted sit to stand using Sarah Plus. BLE blocked laterally to decrease ER and abduction. Sling pulling pt forward. Unable to lift into  stand due to amount of pull on bilat shoulders.  Ambulation/Gait                 Stairs             Wheelchair Mobility    Modified Rankin (Stroke Patients Only)       Balance Overall balance assessment: Needs assistance Sitting-balance support: Feet supported;Feet unsupported;Bilateral upper extremity supported Sitting balance-Leahy Scale: Poor Sitting balance - Comments: Pt sat min guard assist x 5 minutes using Sarah Plus to stabilize BUE on forearms.                                    Cognition Arousal/Alertness: Awake/alert Behavior During Therapy: WFL for tasks assessed/performed Overall Cognitive Status: Within Functional Limits for tasks assessed                       Memory: Decreased short-term memory         General Comments: improving; awareness and insight appear to be improving; decreased STM      Exercises General Exercises - Upper Extremity Shoulder Flexion: AAROM;PROM;Both;10 reps;Supine Elbow Flexion: AAROM;PROM;Both;10 reps;Seated Elbow Extension: AROM;Strengthening;Both;10 reps;Supine Digit Composite Flexion: AROM;AAROM;PROM;Both;10 reps;Seated Composite Extension: PROM;AROM;AAROM;Strengthening;Both;10 reps;Seated    General Comments General comments (skin integrity, edema, etc.): Initially on 10L via TC with SpO2 85-87%. Increased O2 to 15L during mobility with SpO2 86-89%. Returned to 10L in bed at end of session, SpO2 88%.  Pertinent Vitals/Pain Pain Assessment: Faces Faces Pain Scale: Hurts little more Pain Location: general discomfort Pain Descriptors / Indicators: Discomfort;Grimacing Pain Intervention(s): Limited activity within patient's tolerance;Monitored during session    Home Living                      Prior Function            PT Goals (current goals can now be found in the care plan section) Acute Rehab PT Goals Patient Stated Goal: regain strength and  independence Progress towards PT goals: Progressing toward goals    Frequency    Min 3X/week      PT Plan Current plan remains appropriate    Co-evaluation PT/OT/SLP Co-Evaluation/Treatment: Yes Reason for Co-Treatment: Complexity of the patient's impairments (multi-system involvement);For patient/therapist safety PT goals addressed during session: Mobility/safety with mobility;Balance OT goals addressed during session: ADL's and self-care;Strengthening/ROM      AM-PAC PT "6 Clicks" Mobility   Outcome Measure  Help needed turning from your back to your side while in a flat bed without using bedrails?: Total Help needed moving from lying on your back to sitting on the side of a flat bed without using bedrails?: Total Help needed moving to and from a bed to a chair (including a wheelchair)?: Total Help needed standing up from a chair using your arms (e.g., wheelchair or bedside chair)?: Total Help needed to walk in hospital room?: Total Help needed climbing 3-5 steps with a railing? : Total 6 Click Score: 6    End of Session Equipment Utilized During Treatment: Oxygen Activity Tolerance: Patient tolerated treatment well Patient left: in bed;with call bell/phone within reach Nurse Communication: Mobility status PT Visit Diagnosis: Muscle weakness (generalized) (M62.81);Other symptoms and signs involving the nervous system (R29.898);Other abnormalities of gait and mobility (R26.89)     Time: 9767-3419 PT Time Calculation (min) (ACUTE ONLY): 42 min  Charges:  $Therapeutic Activity: 23-37 mins                     Lorrin Goodell, PT  Office # [phone] Pager [phone]    [name] 10/24/2019, 1:01 PM

## 2019-10-24 NOTE — Procedures (Signed)
Objective Swallowing Evaluation: Type of Study: FEES-Fiberoptic Endoscopic Evaluation of Swallow   Patient Details  Name: [name] MRN: [id number] Date of Birth: 1976-02-21  Today's Date: 10/24/2019 Time: SLP Start Time (ACUTE ONLY): 1115 -SLP Stop Time (ACUTE ONLY): 1208  SLP Time Calculation (min) (ACUTE ONLY): 53 min   Past Medical History:  Past Medical History:  Diagnosis Date  . Asthma   . GERD (gastroesophageal reflux disease)   . Headache   . History of kidney stones    LEFT URETERAL STONE  . HTN (hypertension)   . Pancreatitis 2018   GALLBALDDER SLUDGE CAUSED ISSUED RESOLVED   Past Surgical History:  Past Surgical History:  Procedure Laterality Date  . CANNULATION FOR ECMO (EXTRACORPOREAL MEMBRANE OXYGENATION) N/A 08/28/2019   Procedure: CANNULATION FOR VV ECMO (EXTRACORPOREAL MEMBRANE OXYGENATION);  Surgeon: Prescott Gum, Collier Salina, MD;  Location: [place];  Service: Open Heart Surgery;  Laterality: N/A;  CRESCENT CANNULA  . CANNULATION FOR ECMO (EXTRACORPOREAL MEMBRANE OXYGENATION) N/A 09/10/2019   Procedure: CANNULATION FOR ECMO (EXTRACORPOREAL MEMBRANE OXYGENATION) PUTTING IN CRESCENT 32FR CANNULA  AND REMOVING GROING CANNULATION;  Surgeon: Wonda Olds, MD;  Location: [facility];  Service: Open Heart Surgery;  Laterality: N/A;  PUTTING IN CRESCENT/REMOVING GROIN CANNULATION  . CYSTOSCOPY/URETEROSCOPY/HOLMIUM LASER/STENT PLACEMENT Left 04/18/2019   Procedure: LEFT URETEROSCOPY/HOLMIUM LASER/STENT PLACEMENT;  Surgeon: Ardis Hughs, MD;  Location: [facility];  Service: Urology;  Laterality: Left;  . CYSTOSCOPY/URETEROSCOPY/HOLMIUM LASER/STENT PLACEMENT Left 05/02/2019   Procedure: CYSTOSCOPY/URETEROSCOPY/HOLMIUM LASER/STENT EXCHANGE;  Surgeon: Ardis Hughs, MD;  Location: [facility];  Service: Urology;  Laterality: Left;  . ECMO CANNULATION N/A 08/03/2019   Procedure: ECMO CANNULATION;  Surgeon: Wonda Olds, MD;  Location: [facility];   Service: Cardiovascular;  Laterality: N/A;  . ESOPHAGOGASTRODUODENOSCOPY N/A 09/11/2019   Procedure: ESOPHAGOGASTRODUODENOSCOPY (EGD);  Surgeon: Wonda Olds, MD;  Location: [place];  Service: Thoracic;  Laterality: N/A;  . IR [place] Vivianne Master  10/14/2019  . IRRIGATION AND DEBRIDEMENT SHOULDER Right 09/29/2017   Procedure: IRRIGATION AND DEBRIDEMENT SHOULDER;  Surgeon: Leandrew Koyanagi, MD;  Location: [place];  Service: Orthopedics;  Laterality: Right;  . [place] SURGERY  2002  . NASAL ENDOSCOPY WITH EPISTAXIS CONTROL N/A 08/31/2019   Procedure: NASAL ENDOSCOPY WITH EPISTAXIS CONTROL WITH CAUTERIZATION;  Surgeon: Melida Quitter, MD;  Location: [facility];  Service: ENT;  Laterality: N/A;  . PORTACATH PLACEMENT N/A 09/11/2019   Procedure: PEG TUBE INSERTION - BEDSIDE;  Surgeon: Wonda Olds, MD;  Location: [facility];  Service: Thoracic;  Laterality: N/A;  . TEE WITHOUT CARDIOVERSION N/A 08/28/2019   Procedure: TRANSESOPHAGEAL ECHOCARDIOGRAM (TEE);  Surgeon: Prescott Gum, Collier Salina, MD;  Location: [facility];  Service: Open Heart Surgery;  Laterality: N/A;  . TEE WITHOUT CARDIOVERSION N/A 09/10/2019   Procedure: TRANSESOPHAGEAL ECHOCARDIOGRAM (TEE);  Surgeon: Wonda Olds, MD;  Location: [facility];  Service: Open Heart Surgery;  Laterality: N/A;   HPI: Pt is a 44 y/o male admitted 12/14 with COVID pneumonia leading to ARDS and needing mechanical ventilation, pneumomediastinum. He was intubated 12/26-12/29 with trach placement on latter date. PEG tube done on 09/11/19 and replaced on 10/14/19 after dislodging. Decannulated from VV ECMO on 3/4 after 65 days. Pt downsized to #6 distal cuffless on 10/22/19. Has PMV and FEES recommended following BSE yesterday.    No data recorded   Assessment / Plan / Recommendation  CHL IP CLINICAL IMPRESSIONS 10/24/2019  Clinical Impression Pt exhibited pharyngoesophageal dysphagia with  apparent UES (upper esophageal sphincter) dysfunction and/or esophageal primary  component with LPR (laryngopharyngeal reflux) through UES with penetration and aspiration. SpO2 around 85-90% at end of PT/OT (sat on edge of bed) session just prior to SLP arriving and OT donning and doffing PMV when necessary/needed. Valve doffed during FEES set up and donned at initiation of FEES. SpO2 sats fluctuated 86-88% then 88-90% when RN bumped O2 from 10L to 12L during the assessment. Pharyngeal/laryngeal tissue clearly viewed without mucous residue present. His left arytenoid appeared larger and crossed midline. Vocal cords adducting upon phonation functionally.  Mildly decreased tongue base retraction with mild-moderate pharyngeal wall, vallecular and pyriform sinus residue with thicker puree and honey thick liquids. Timing of laryngeal protective mechanisms including epiglottic deflection appeared overal timely and functional. As FEES progressed solid cracker, thin mixed with secretions was refluxed and entered his trachea. Cues to cough revealed green colored (green dye) liquid/solid/mucous coming up and out of trach (slightly green tinged secretions expectorated from trach after procedure). He was sitting in an adequate upright position in bed. The esophagus cannot be viewed with FEES therefore unable to speculate on etiology (UES dysfunction vs motility/perastalsis issue?). Pt has history of GER and on PPI presently. Would he benefit from trial of different medication for GER? Continue NPO status. SLP will continue to see for dysphagia therapy and provide trials of puree (3-5 bites only with SLP) for continued activation and use of swallowing musculature. As he continues to improve from strength/endurance standpoint, hopeful that UES/esophageal function may improve. When ready for follow up swallow assessment, recommend he have an MBS (vs FEES) to make observations of esophagus (although cannot diagnose below level of UES). Mother present at end of procdure and updated with pt's permission.        SLP Visit Diagnosis Dysphagia, pharyngoesophageal phase (R13.14)  Attention and concentration deficit following --  Frontal lobe and executive function deficit following --  Impact on safety and function Moderate aspiration risk;Severe aspiration risk      CHL IP TREATMENT RECOMMENDATION 10/24/2019  Treatment Recommendations Therapy as outlined in treatment plan below     Prognosis 10/23/2019  Prognosis for Safe Diet Advancement Good  Barriers to Reach Goals Severity of deficits;Time post onset  Barriers/Prognosis Comment --    CHL IP DIET RECOMMENDATION 10/24/2019  SLP Diet Recommendations NPO  Liquid Administration via --  Medication Administration Via alternative means  Compensations --  Postural Changes --      CHL IP OTHER RECOMMENDATIONS 10/24/2019  Recommended Consults Consider esophageal assessment;Consider GI evaluation  Oral Care Recommendations Oral care QID  Other Recommendations --      CHL IP FOLLOW UP RECOMMENDATIONS 10/24/2019  Follow up Recommendations Inpatient Rehab      CHL IP FREQUENCY AND DURATION 10/24/2019  Speech Therapy Frequency (ACUTE ONLY) min 2x/week  Treatment Duration 2 weeks           CHL IP ORAL PHASE 10/24/2019  Oral Phase Impaired  Oral - Pudding Teaspoon --  Oral - Pudding Cup --  Oral - Honey Teaspoon WFL  Oral - Honey Cup NT  Oral - Nectar Teaspoon --  Oral - Nectar Cup WFL  Oral - Nectar Straw --  Oral - Thin Teaspoon --  Oral - Thin Cup WFL  Oral - Thin Straw --  Oral - Puree WFL  Oral - Mech Soft --  Oral - Regular Lingual/palatal residue  Oral - Multi-Consistency --  Oral - Pill --  Oral Phase - Comment --  CHL IP PHARYNGEAL PHASE 10/24/2019  Pharyngeal Phase Impaired  Pharyngeal- Pudding Teaspoon --  Pharyngeal --  Pharyngeal- Pudding Cup --  Pharyngeal --  Pharyngeal- Honey Teaspoon Pharyngeal residue - pyriform;Pharyngeal residue - valleculae;Inter-arytenoid space residue;Penetration/Aspiration during  swallow;Penetration/Apiration after swallow  Pharyngeal --  Pharyngeal- Honey Cup --  Pharyngeal --  Pharyngeal- Nectar Teaspoon Pharyngeal residue - pyriform;Pharyngeal residue - valleculae;Inter-arytenoid space residue  Pharyngeal --  Pharyngeal- Nectar Cup Inter-arytenoid space residue;Pharyngeal residue - valleculae;Pharyngeal residue - pyriform  Pharyngeal --  Pharyngeal- Nectar Straw --  Pharyngeal --  Pharyngeal- Thin Teaspoon --  Pharyngeal --  Pharyngeal- Thin Cup Penetration/Aspiration during swallow;Penetration/Apiration after swallow;Inter-arytenoid space residue  Pharyngeal Material enters airway, passes BELOW cords then ejected out  Pharyngeal- Thin Straw --  Pharyngeal --  Pharyngeal- Puree Pharyngeal residue - valleculae;Pharyngeal residue - pyriform;Pharyngeal residue - cp segment;Inter-arytenoid space residue  Pharyngeal --  Pharyngeal- Mechanical Soft --  Pharyngeal --  Pharyngeal- Regular Pharyngeal residue - pyriform;Pharyngeal residue - valleculae;Inter-arytenoid space residue  Pharyngeal --  Pharyngeal- Multi-consistency --  Pharyngeal --  Pharyngeal- Pill --  Pharyngeal --  Pharyngeal Comment --     CHL IP CERVICAL ESOPHAGEAL PHASE 10/24/2019  Cervical Esophageal Phase Impaired  Pudding Teaspoon --  Pudding Cup --  Honey Teaspoon --  Honey Cup --  Nectar Teaspoon --  Nectar Cup --  Nectar Straw --  Thin Teaspoon --  Thin Cup --  Thin Straw --  Puree --  Mechanical Soft --  Regular --  Multi-consistency --  Pill --  Cervical Esophageal Comment --     [name] 10/24/2019, 2:53 PM   [name] Kimberlye Dilger M.Ed Risk analyst [phone] Office [phone]

## 2019-10-24 NOTE — Progress Notes (Addendum)
Nutrition Follow up  DOCUMENTATION CODES:   Obesity unspecified  INTERVENTION:   Recommend administering bolus feedings via gravity method vs plunging quickly as this can contribute to loose stools.   Change bolus feedings:  -2 cans Osmolite 1.5 four times daily (0800, 1200, 1600, 2000) -60 ml Prostat BID -Juven BID -Free water flushes 150 ml Q6 hours- per CCM  Provides: 3430 kcals, 184 grams protein, 1448 ml free water (2048 ml with flushes). Meets 100% of needs.   NUTRITION DIAGNOSIS:   Increased nutrient needs related to acute illness(COVID PNA) as evidenced by estimated needs.  Ongoing  GOAL:   Patient will meet greater than or equal to 90% of their needs   Addressed via TF  MONITOR:   Diet advancement, Skin, TF tolerance, Weight trends, Labs, I & O's  REASON FOR ASSESSMENT:   Consult Enteral/tube feeding initiation and management  ASSESSMENT:   Patient with PMH significant for GERD, HTN, and asthma. Presents this admission with COVID-19 PNA.   12/14- admit 12/23- chest xray revealed pneumomediastinum  12/24- developed bilateral pneumothoraces  12/26- worsening hypoxemia, intubated  12/27- s/p VV fem/fem ECMO cannulation  12/29- s/p trach  1/5- nasal bleeding- packing per CCM 1/7- nasal packing per ENT 1/8- trach exchanged, ECMO circuit changed  1/20- s/p additional RIJ drainage cannula  1/21- Cortrak kinked, replaced by OGT, pt vomited over night 1/22- ECMO circuit exchanged  1/23- nasal endoscopy, control of epistaxis  2/2- TEE, Cortrak removed, change to crescent catheter, removal of femoral lines 2/3- s/p PEG, EGD 2/15- developed recurrent PNA/sepsis  3/2- decannulation cancelled 3/4- s/p decannulation VV ECMO 3/7- PEG dislodged  3/8- s/p PEG replacement by IR   Discussed tube feeding with RN. Pt having loose stools after bolus feedings that prevent him from sleeping. He continues to receive metamucil but shows little improvement. Decrease  Prostat as this can sometimes contribute to symptoms. Transition to 2 cans of bolus feedings at a time to lessen frequency of feedings and to mimic day time meals. Avoid plunging feedings as this can result in loose stools as well, RN made aware. If symptoms persist may need to consider changing formula.   Pt had FEEs today. Continues to be NPO. SLP hopeful that UES/esophgeal function will improve.    Admission weight: 136.1 kg  Current weight: 122.6 kg (down 6 kg in last three days)  I/O: -7,818 ml since 3/4 UOP: 2,00 ml x 24 hrs   Medications: 500 mg Vitamin C, SS novolog, metamucil Labs: Phosphorus 4.7 (H) CBG 74-164  Diet Order:   Diet Order            Diet NPO time specified  Diet effective midnight              EDUCATION NEEDS:   Not appropriate for education at this time  Skin:  Skin Assessment: Skin Integrity Issues: Skin Integrity Issues:: DTI, Stage II, Stage III DTI: bilateral heels Stage II: buttocks Stage III: n/a Incisions: L wrist, neck Other: n/a  Last BM:  3/18  Height:   Ht Readings from Last 1 Encounters:  08/09/19 5' 8"  (1.727 m)    Weight:   Wt Readings from Last 1 Encounters:  10/24/19 122.3 kg    Ideal Body Weight:  70 kg  BMI:  Body mass index is 41 kg/m.  Estimated Nutritional Needs:   Kcal:  4536-4680 kcal (25-30 kcal/kg)  Protein:  180-215 grams  Fluid:  >/= 2.5 L/day   [name] RD, LDN Clinical  Nutrition Pager # - [phone]

## 2019-10-24 NOTE — Progress Notes (Signed)
  Speech Language Pathology  Patient Details Name: [name] MRN: [id number] DOB: 03-03-1976 Today's Date: 10/24/2019 Time:  -     Pt will have FEES swallow assessment in his room today at 11:00                  [name] 10/24/2019, 10:07 AM   [name] [name].Ed Risk analyst [phone] Office [phone]

## 2019-10-24 NOTE — Progress Notes (Signed)
Inpatient Rehabilitation Admissions Coordinator  I spoke with pt's wife by phone to discuss rehab goals and expectations. She has concerns about projected level of care and what support family can provide at home. I will further discuss with CIR team and speak with Butch Penny at [facility].  Danne [name], RN, MSN Rehab Admissions Coordinator [phone] 10/24/2019 1:11 PM

## 2019-10-24 NOTE — Plan of Care (Signed)
  Problem: Education: Goal: Knowledge of risk factors and measures for prevention of condition will improve Outcome: Progressing   Problem: Coping: Goal: Psychosocial and spiritual needs will be supported Outcome: Progressing   Problem: Respiratory: Goal: Complications related to the disease process, condition or treatment will be avoided or minimized Outcome: Progressing   Problem: Respiratory: Goal: Ability to maintain a clear airway and adequate ventilation will improve Outcome: Progressing   Problem: Role Relationship: Goal: Method of communication will improve Outcome: Progressing

## 2019-10-24 NOTE — Progress Notes (Signed)
NAME:  [name], MRN:  [id number], DOB:  09/10/1975, LOS: 45 ADMISSION DATE:  07/22/2019, CONSULTATION DATE:  12/24 REFERRING MD:  Sloan Leiter, CHIEF COMPLAINT:  Dyspnea   Brief History   44 y/o male admitted 12/14 with COVID pneumonia leading to ARDS, pneumomediastinum.  Intubated 12/26, bilateral chest tubes placed.  Past Medical History  GERD HTN Asthma  Significant Hospital Events   12/14 admit with hypoxemic respiratory failure in setting of COVID-19 pneumonia 12/23 noted to have pneumomediastinum on chest x-ray 12/24PCCM consulted for evaluation 12/26 worsening hypoxemia, intubated 12/27 VV fem/fem ECMO cannulation 12/29 tracheostomy >> See bedside nursing event notes for full rundown of procedures after 12/27 1/5 epistaxis, nasal packing 1/30 improved cxr, improved lung compliance, TV 350s on 15PC  1/31 CXR increased infiltrates, +CFB     2/2 Converted to right IJ Crescent cannula. 2/3 PEG tube inserted at bedside 2/14 30 min sweep trial> paO2 105 (18 PEEP, 60% FiO2) pH 7.22, pCO2 87 2/20 2 hours sweep trial 2/21 1.5 hour sweep trial 2/24 7-hour sweep trial.  Increasingly awake. 3/2 - decannulation attempt aborted due to increased hypercapnia when sedated for procedure 3/4- dcannulated 3/8 PEG tube repositioned by IR 3/9 >> trach wean 3/14 started 24/7 TC  Consults:  PCCM, CTS, CHF, Palliative  Procedures:  PICC 1/3 >> R radial a-line 1/20 >> R IJ ECMO cath 2/3 >> Tracheostomy 12/29 >> 10/14/2019 PEG tube replacement>>  Significant Diagnostic Tests:  CT chest 12/22>>extensive pneumomediastinum, multifocal patchy bilateral groundglass opacities CXR 1/25 >> improving bilateral infiltrates. ECMO cannulae in place. CXR 2/5 >> clearing bilateral infiltrates  Micro Data:  BCx2 12/14 >>negative  Sputum 12/15 >> OPF 12/28 blood > negative 12/29 > staph aureus 1/4 bronch:  1/2 acid fast smear bronch: negative 1/2 fungal cx bronch: negative1/12 BAL:  serratia, staph epidermidis 1/4 BAL > MSSA, Serratia 1/9 blood > negative 1/9 Resp culture > MSSA, serratia 1/11 serratia > negative 1/12 fungus culture > negative 1/12 BAL > serratia 1/18 BAL >> rare Serratia. 1/24 blood > negative 1/30 blood > negative 1/30 resp culture > serratia 2/1 BAL >> rare Serratia - enterococcus 2/1 aspergillus Ag > negative 2/2 resp culture > serratia  2/2 blood > negative 2/5 urine > negative 2/9 resp: serratia 2/15 BAL serratia 3/5 blood cultures ngtd  Antimicrobials:  Received remdesivir, steroids, actemra and convalescent plasma  See MAR for remainder  Interim history/subjective:  Slept better last night. Gets awaken by post-tf diarrhea.  Objective   Blood pressure (!) 151/75, pulse (!) 102, temperature 99.8 F (37.7 C), temperature source Axillary, resp. rate (!) 22, height _0  (1.727 m), weight 122.3 kg, SpO2 99 %.    FiO2 (%):  [28 %-60 %] 40 %   Intake/Output Summary (Last 24 hours) at 10/24/2019 0823 Last data filed at 10/24/2019 0600 Gross per 24 hour  Intake 2060.96 ml  Output 2000 ml  Net 60.96 ml   Filed Weights   10/22/19 0600 10/23/19 0500 10/24/19 0500  Weight: 123.8 kg 123.6 kg 122.3 kg    Examination: GEN: chronically ill appearing HEENT: MMM, trach in place, small mucoid secretions CV: RRR, ext warm PULM: Diminished bases, scattered rhonci GI: PEG in place, soft, +BS EXT: global anasarca NEURO: Moves all 4 ext to command, profound weakness PSYCH: RASS 0  SKIN: Slightly pale, multiple pressure wounds, eschar on lateral feet stable  ABG    Component Value Date/Time   PHART 7.384 10/10/2019 1817   PCO2ART 63.7 (H) 10/10/2019 1817  PO2ART 76.0 (L) 10/10/2019 1817   HCO3 38.0 (H) 10/10/2019 1817   TCO2 40 (H) 10/10/2019 1817   ACIDBASEDEF 4.0 (H) 09/16/2019 2334   O2SAT 84.5 10/20/2019 0500     Resolved Hospital Problem list   Pneumothoraces-resolved.  Dislodged PEG tube Indurated area of the skin  right lateral PEG tube.  Serratia pneumonia> treated.   Assessment & Plan:   ARDS due to COVID 19, now requiring mechanical ventilation support at night Decannulated from VV ECMO on 3/4 after 65 days. Tracheostomy tube in place Changed to 6 distal XLT 3/16 - Continue indefinite TC - PMV with SLP - Suctioning PRN  AKI with azotemia - improving  Positive cumulative fluid balance Hypernatremia has corrected Severe third spacing -diuresis per CHF team  Hypertension - stable at this time   Neuromuscular weakness - OOB to chair as much as tolerated  - continued aggressive PT  Bilateral lower feet deep tissue injury Wound care continued for feet  Anemia due to critical illnes Conservative transfusion threshold  Insomnia- on qHS seroquel, melatonin, trazodone, PRN ativan   TF-diarrhea- RD to be consulted to change schedule to minimize night-time awakenings and potentially change formula?  Best practice:  Pain/Anxiety/Delirium protocol (if indicated): - Dilaudid 19m q6h PRN - Precedex- clonidine taper - Valium 2.532mBID PRN - Citalopram 1065maily - Trazodone 100m76mS - PRN ativan qHS - Seroquel 50mg54m (increased today) - Melatonin started today  VAP protocol (if indicated): Bundle in place. Respiratory support goals: Trach collar trial DVT prophylaxis: Lovenox GI prophylaxis: PPI Nutrition: TF Fluid status goals:  See above Urinary catheter: external urinary catheter Glucose control: doing well with SSI alone Mobility/therapy needs: up with PT.  Daily labs: As needed Code Status: Full code  Disposition: to be determined Family: updated when at bedside  Labs   CBC: CBC Latest Ref Rng & Units 10/24/2019 10/23/2019 10/22/2019  WBC 4.0 - 10.5 K/uL 7.8 7.6 5.8  Hemoglobin 13.0 - 17.0 g/dL 10.5(L) 10.8(L) 9.9(L)  Hematocrit 39.0 - 52.0 % 36.1(L) 36.8(L) 34.3(L)  Platelets 150 - 400 K/uL 236 244 249     Basic Metabolic Panel: BMP Latest Ref Rng & Units  10/24/2019 10/23/2019 10/22/2019  Glucose 70 - 99 mg/dL 92 97 102(H)  BUN 6 - 20 mg/dL 40(H) 47(H) 48(H)  Creatinine 0.61 - 1.24 mg/dL 0.61 0.54(L) 0.50(L)  Sodium 135 - 145 mmol/L 144 142 142  Potassium 3.5 - 5.1 mmol/L 4.3 3.7 4.0  Chloride 98 - 111 mmol/L 105 103 104  CO2 22 - 32 mmol/L _0 Calcium 8.9 - 10.3 mg/dL 9.0 9.1 8.8(L)     GFR: Estimated Creatinine Clearance: 148.4 mL/min (by C-G formula based on SCr of 0.61 mg/dL). Recent Labs  Lab 10/21/19 0440 10/22/19 0358 10/23/19 0440 10/24/19 0327  WBC 6.0 5.8 7.6 7.8    Liver Function Tests: No results for input(s): AST, ALT, ALKPHOS, BILITOT, PROT, ALBUMIN in the last 168 hours. No results for input(s): LIPASE, AMYLASE in the last 168 hours. No results for input(s): AMMONIA in the last 168 hours.  ABG    Component Value Date/Time   PHART 7.384 10/10/2019 1817   PCO2ART 63.7 (H) 10/10/2019 1817   PO2ART 76.0 (L) 10/10/2019 1817   HCO3 38.0 (H) 10/10/2019 1817   TCO2 40 (H) 10/10/2019 1817   ACIDBASEDEF 4.0 (H) 09/16/2019 2334   O2SAT 84.5 10/20/2019 0500     Coagulation Profile: No results for input(s): INR, PROTIME in the last 168  hours.  Cardiac Enzymes: No results for input(s): CKTOTAL, CKMB, CKMBINDEX, TROPONINI in the last 168 hours.  HbA1C: Hgb A1c MFr Bld  Date/Time Value Ref Range Status  09/23/2019 08:58 AM 5.5 4.8 - 5.6 % Final    Comment:    (NOTE) Pre diabetes:          5.7%-6.4% Diabetes:              >6.4% Glycemic control for   <7.0% adults with diabetes   07/24/2019 04:50 AM 6.7 (H) 4.8 - 5.6 % Final    Comment:    (NOTE) Pre diabetes:          5.7%-6.4% Diabetes:              >6.4% Glycemic control for   <7.0% adults with diabetes     CBG: Recent Labs  Lab 10/23/19 1524 10/23/19 2023 10/23/19 2351 10/24/19 0330 10/24/19 0815  GLUCAP 74 97 132* Edmondson, MD [facility] 10/24/2019 8:23 AM

## 2019-10-25 LAB — CBC
HCT: 35.5 % — ABNORMAL LOW (ref 39.0–52.0)
Hemoglobin: 10.4 g/dL — ABNORMAL LOW (ref 13.0–17.0)
MCH: 28 pg (ref 26.0–34.0)
MCHC: 29.3 g/dL — ABNORMAL LOW (ref 30.0–36.0)
MCV: 95.7 fL (ref 80.0–100.0)
Platelets: 213 10*3/uL (ref 150–400)
RBC: 3.71 MIL/uL — ABNORMAL LOW (ref 4.22–5.81)
RDW: 16.6 % — ABNORMAL HIGH (ref 11.5–15.5)
WBC: 6.3 10*3/uL (ref 4.0–10.5)
nRBC: 0 % (ref 0.0–0.2)

## 2019-10-25 LAB — BASIC METABOLIC PANEL
Anion gap: 8 (ref 5–15)
BUN: 43 mg/dL — ABNORMAL HIGH (ref 6–20)
CO2: 30 mmol/L (ref 22–32)
Calcium: 9 mg/dL (ref 8.9–10.3)
Chloride: 106 mmol/L (ref 98–111)
Creatinine, Ser: 0.55 mg/dL — ABNORMAL LOW (ref 0.61–1.24)
GFR calc Af Amer: >60 mL/min (ref >60)
GFR calc non Af Amer: >60 mL/min (ref >60)
Glucose, Bld: 98 mg/dL (ref 70–99)
Potassium: 4.1 mmol/L (ref 3.5–5.1)
Sodium: 144 mmol/L (ref 135–145)

## 2019-10-25 LAB — PHOSPHORUS: Phosphorus: 4.3 mg/dL (ref 2.5–4.6)

## 2019-10-25 LAB — GLUCOSE, CAPILLARY
Glucose-Capillary: 102 mg/dL — ABNORMAL HIGH (ref 70–99)
Glucose-Capillary: 98 mg/dL (ref 70–99)
Glucose-Capillary: 178 mg/dL — ABNORMAL HIGH (ref 70–99)
Glucose-Capillary: 87 mg/dL (ref 70–99)
Glucose-Capillary: 103 mg/dL — ABNORMAL HIGH (ref 70–99)
Glucose-Capillary: 114 mg/dL — ABNORMAL HIGH (ref 70–99)

## 2019-10-25 LAB — MAGNESIUM: Magnesium: 2 mg/dL (ref 1.7–2.4)

## 2019-10-25 MED ORDER — PSYLLIUM 95 % PO PACK
1.0000 | PACK | Freq: Three times a day (TID) | ORAL | Status: DC
  Administered 2019-10-25 – 2019-11-14 (×49): 1 via ORAL
  Filled 2019-10-25 (×62): qty 1

## 2019-10-25 MED ORDER — OSMOLITE 1.5 CAL PO LIQD
1000.0000 mL | ORAL | Status: DC
  Administered 2019-10-25 – 2019-11-03 (×8): 1000 mL
  Filled 2019-10-25 (×18): qty 1000

## 2019-10-25 MED ORDER — AMLODIPINE BESYLATE 10 MG PO TABS
10.0000 mg | ORAL_TABLET | Freq: Every day | ORAL | Status: DC
  Administered 2019-10-25 – 2019-11-11 (×18): 10 mg
  Filled 2019-10-25 (×17): qty 1

## 2019-10-25 MED ORDER — PRO-STAT SUGAR FREE PO LIQD
60.0000 mL | Freq: Three times a day (TID) | ORAL | Status: DC
  Administered 2019-10-25 – 2019-11-04 (×28): 60 mL
  Filled 2019-10-25 (×29): qty 60

## 2019-10-25 NOTE — Progress Notes (Addendum)
Patient ID: [name], male   DOB: 04-01-1976, 44 y.o.   MRN: [id number]    Advanced Heart Failure Rounding Note   Subjective:    Remains on TC.   mTemp past 24 hrs 101. WBC normal, 6.3. Getting scheduled Tylenol  CVP 1.  BP elevated, SBPs 150s-160s.    Objective:   Weight Range:  Vital Signs:   Temp:  [97.7 F (36.5 C)-101 F (38.3 C)] 98.4 F (36.9 C) (03/19 0900) Pulse Rate:  [90-114] 97 (03/19 0918) Resp:  [10-29] 19 (03/19 0918) BP: (120-160)/(54-96) 139/65 (03/19 0700) SpO2:  [87 %-98 %] 98 % (03/19 0918) FiO2 (%):  [40 %-80 %] 60 % (03/19 0918) Weight:  [121.2 kg] 121.2 kg (03/19 0500) Last BM Date: 10/24/19  Weight change: Filed Weights   10/23/19 0500 10/24/19 0500 10/25/19 0500  Weight: 123.6 kg 122.3 kg 121.2 kg    Intake/Output:   Intake/Output Summary (Last 24 hours) at 10/25/2019 0931 Last data filed at 10/25/2019 0400 Gross per 24 hour  Intake 474 ml  Output 1000 ml  Net -526 ml     PHYSICAL EXAM: CVP 1 General:  Obese WM w/ TC, No resp difficulty HEENT: normal anicteric  Neck: on TCSupple. no JVD. Carotids 2+ bilat; no bruits. No lymphadenopathy or thryomegaly appreciated. Cor: PMI nondisplaced. Regular rate & rhythm. No rubs, gallops or murmurs. Lungs: CTAB  o wheeze Abdomen: obese soft, nontender, nondistended. No hepatosplenomegaly. No bruits or masses. Good bowel sounds. + Peg  Extremities: no cyanosis, clubbing, rash, tr bilateral pretibial/ pedal edema. +RUE PICC  Neuro: alert & orientedx3, cranial nerves grossly intact. Weak but moves all 4 extremities  With assistance.  Affect flat Skin: R and L foot black eschar   Telemetry: SR 80-90s Personally reviewed    Labs: Basic Metabolic Panel: Recent Labs  Lab 10/21/19 0440 10/21/19 0440 10/22/19 0358 10/22/19 0358 10/23/19 0440 10/24/19 0327 10/25/19 0354  NA 142  --  142  --  142 144 144  K 3.8  --  4.0  --  3.7 4.3 4.1  CL 101  --  104  --  103 105 106  CO2 31   --  29  --  27 29 30   GLUCOSE 92  --  102*  --  97 92 98  BUN 38*  --  48*  --  47* 40* 43*  CREATININE 0.33*  --  0.50*  --  0.54* 0.61 0.55*  CALCIUM 8.9   < > 8.8*   < > 9.1 9.0 9.0  MG 1.9  --  1.9  --  1.8 2.1 2.0  PHOS 3.2  --  3.2  --  3.5 4.7* 4.3   < > = values in this interval not displayed.    Liver Function Tests: No results for input(s): AST, ALT, ALKPHOS, BILITOT, PROT, ALBUMIN in the last 168 hours. No results for input(s): LIPASE, AMYLASE in the last 168 hours. No results for input(s): AMMONIA in the last 168 hours.  CBC: Recent Labs  Lab 10/21/19 0440 10/22/19 0358 10/23/19 0440 10/24/19 0327 10/25/19 0354  WBC 6.0 5.8 7.6 7.8 6.3  HGB 9.9* 9.9* 10.8* 10.5* 10.4*  HCT 35.0* 34.3* 36.8* 36.1* 35.5*  MCV 97.0 95.0 93.4 94.5 95.7  PLT 249 249 244 236 213    Cardiac Enzymes: No results for input(s): CKTOTAL, CKMB, CKMBINDEX, TROPONINI in the last 168 hours.  BNP: BNP (last 3 results) Recent Labs    07/22/19 1039  BNP 15.3    ProBNP (last 3 results) No results for input(s): PROBNP in the last 8760 hours.    Other results:  Imaging: No results found.   Medications:     Scheduled Medications: . acetaminophen (TYLENOL) oral liquid 160 mg/5 mL  650 mg Per Tube Q6H  . amLODipine  5 mg Per Tube Daily  . vitamin C  500 mg Per Tube Daily  . carvedilol  12.5 mg Per Tube BID WC  . chlorhexidine  15 mL Mouth Rinse BID  . Chlorhexidine Gluconate Cloth  6 each Topical Daily  . clonazepam  1 mg Per Tube QHS  . cloNIDine  0.1 mg Per Tube Q12H  . enoxaparin (LOVENOX) injection  60 mg Subcutaneous Q24H  . escitalopram  10 mg Per Tube Daily  . feeding supplement (OSMOLITE 1.5 CAL)  474 mL Per Tube QID  . feeding supplement (PRO-STAT SUGAR FREE 64)  60 mL Per Tube BID  . free water  150 mL Per Tube Q6H  . Gerhardt's butt cream   Topical QID  . insulin aspart  0-15 Units Subcutaneous Q4H  . ipratropium-albuterol  3 mL Nebulization TID  . magic mouthwash   5 mL Oral TID  . mouth rinse  15 mL Mouth Rinse q12n4p  . Melatonin  6 mg Per Tube QHS  . montelukast  10 mg Per Tube QHS  . nutrition supplement (JUVEN)  1 packet Per Tube BID BM  . pantoprazole sodium  40 mg Per Tube BID  . psyllium  1 packet Per Tube BID  . QUEtiapine  50 mg Per Tube QHS  . sodium chloride flush  10-40 mL Intracatheter Q12H  . traZODone  100 mg Per Tube QHS    Infusions: . sodium chloride Stopped (10/13/19 0548)  . dexmedetomidine (PRECEDEX) IV infusion Stopped (10/22/19 1313)    PRN Medications: sodium chloride, acetaminophen (TYLENOL) oral liquid 160 mg/5 mL, acetaminophen, albuterol, [COMPLETED] diazepam **FOLLOWED BY** diazepam, diphenoxylate-atropine, hydrALAZINE, HYDROmorphone, LORazepam **OR** LORazepam, magnesium hydroxide, ondansetron (ZOFRAN) IV, senna-docusate, sodium chloride, sodium chloride flush   Assessment/Plan:    1. Acute hypoxic/hypercarbic respiratory failure due to COVID PNA: Remained markedly hypoxic despite full support. Intubated 12/26. S/p bilateral CTs 12/26 for pneumothoraces.  TEE on 12/26 LVEF 70% RV ok. VV ECMO begun 12/26. Tracheostomy 12/29.  COVID ARDS at this point. ECMO circuit changed 1/8 & 1/23. Crescent cannula placed 2/2 (femoral cannuals removed) - Decannulated 3/4 - On trach collar sats stable.   2. COVID PNA: management of COVID infection per CCM. CXR with bilateral multifocal PNA progressing to likely ARDS.  - s/p 10 days of remdesivir - 12/16, 12/2 s/p tociluzimab - 12/17 s/p convalescent plasma - Decadron completed 1/11  3. ID: Empiric coverage with vancomycin/cefepime initially for possible secondary bacterial PNA, course completed.   - Completed meropenem  - mTemp 3/19 101. WBC WNL. Follow closely.   4. Anemia: - hgb 10.4  5. FEN - Remains on TFs. Stable  5. Hypernatremia -Resolved. -Continue free water.   6. AKI - Resolved.    7. DTIs - On both feet.  Black eschar. Stable.   8.   Hypokalemia. Resolved.   9.  Diffuse muscle weakness. -This is consistent with ICU/steroid/paralytics myopathy.  Continue passive range of motion exercises with PT.   -PT and OT are following.  Both have recommended CIR.  CIR has seen and feel he is not ready yet.  -PT/OT will help provide additional recommendations for staff RN  10. HTN -BP elevated, SBPs 150s-160s - Increase amlodipine to 10 mg   - Continue Coreg 12.5 bid   Length of Stay: 95  Brittainy Simmons PA-C  10/25/2019, 9:31 AM  Advanced Heart Failure Team Pager [phone] (M-F; [facility])  Please contact [facility] for night-coverage after hours (4p -7a ) and weekends on [email or web address]   Patient seen and examined with the above-signed Advanced Practice Provider and/or Housestaff. I personally reviewed laboratory data, imaging studies and relevant notes. I independently examined the patient and formulated the important aspects of the plan. I have edited the note to reflect any of my changes or salient points. I have personally discussed the plan with the patient and/or family.  Remains on trach collar. Stable. Denies CP or SOB.Still with loose stools.   BP elevated Remains very weak but working with PT/OT   Continue TC wean. Discussed changing TFs with pharmD. Increase amlodipine for HTN. DTIs on feet healing. Continue aggressive PT/OT. Watch for recurrent infection.   Glori Bickers, MD  4:47 PM

## 2019-10-25 NOTE — Progress Notes (Signed)
Patient desaturated to 82%, patient was suctioned and oxygen increased to 80% on trach collar. Will continue to monitor patient.

## 2019-10-25 NOTE — Progress Notes (Signed)
Occupational Therapy Progress Note   Assisted nsg with transferring pt to [facility] bed. Pt tolerated tilt initially to 33* progressing to 40* with VSS - pt on 60% Fi02 via trac collar. Pt very anxious initially with tilting.  He demonstrates significant improvements with head/neck and improving trunk strength and control.    Pt tolerated wrist splint with no evidence of pressure.  Splint schedule and tilt schedule established with nursing and sign posted in room. Marland Kitchen   10/25/19 1836  OT Visit Information  Last OT Received On 10/25/19  Assistance Needed +3 or more  History of Present Illness 44 y/o M admitted 12/14 with COVID pneumonia causing acute hypoxemic respiratory failure.   Developed pneumomediastinum 12/23 with concern for possible bilateral small pneumothoraces on 12/24. Pt intubated on 12/26 and started on ECMO on 12/27. Tracheostomy on 12/29 with bleeding from trach an dother sites on 12/31. Bronch on 1/11 and 1/15. Slowly weaning sedation 2/5. Continuing to wean sedation and starting sweep trials to wean ECMO - Decannulated 3/4  Precautions  Precautions Fall;Other (comment)  Precaution Comments PEG, trach collar, bilat foot wounds  Required Braces or Orthoses Other Brace  Other Brace B prevalon boots  Pain Assessment  Pain Assessment Faces  Faces Pain Scale 4  Pain Location bil. knees calves, and buttockx   Pain Descriptors / Indicators Discomfort;Grimacing;Restless  Pain Intervention(s) Monitored during session  Cognition  Arousal/Alertness Awake/alert  Behavior During Therapy Anxious  Overall Cognitive Status Impaired/Different from baseline  Area of Impairment Attention;Following commands;Problem solving  Current Attention Level Selective;Alternating  General Comments Pt very anxious this session in anticipation of tilting in the bed   Upper Extremity Assessment  Upper Extremity Assessment RUE deficits/detail;LUE deficits/detail  RUE Deficits / Details When pt placed in  gravity eliminated position, he is able to extend wrist to neutral x 4 before fatiging, and able to activate elbow flexion x 6 before fatiguing (1/5).   finger flexion ~50% composite flexion actively, shoulder 2-/5; tricep at least 3+/5   RUE Coordination decreased fine motor;decreased gross motor  LUE Deficits / Details Pt with 1+/5 - 2-/5 elbow flexion, tricpe at least 3+/5; shoulder 2-/5; wrist 2+/5 ext, 2/5 flexion; and able to achieve ~80% composite flexion   LUE Coordination decreased fine motor;decreased gross motor  Lower Extremity Assessment  Lower Extremity Assessment Defer to PT evaluation  General Comments  General comments (skin integrity, edema, etc.) pt on 60% Fi02 with sats >95%   Other Exercises  Other Exercises assisted RN with transfer onto [facility] tilt bed.  rationale for tilt explained to pt.  He was intially moved to 38* with good tolerance for ~10 mins, then progressed to 40* for 36mns.  VSS.  Pt  able to lift head/neck and trunk off bed to shift his shoulders and weight to the Lt spontaneously   OT - End of Session  Equipment Utilized During Treatment Oxygen  Activity Tolerance Patient tolerated treatment well  Patient left in chair;with call bell/phone within reach  Nurse Communication Mobility status  OT Assessment/Plan  OT Plan Discharge plan remains appropriate  OT Visit Diagnosis Muscle weakness (generalized) (M62.81);Other abnormalities of gait and mobility (R26.89);Other symptoms and signs involving cognitive function;Pain  Pain - Right/Left Right  Pain - part of body Leg;Knee  OT Frequency (ACUTE ONLY) Min 3X/week  Follow Up Recommendations CIR;Supervision/Assistance - 24 hour  OT Equipment None recommended by OT  AM-PAC OT "6 Clicks" Daily Activity Outcome Measure (Version 2)  Help from another person eating meals?  1  Help from another person taking care of personal grooming? 2  Help from another person toileting, which includes using toliet, bedpan, or  urinal? 1  Help from another person bathing (including washing, rinsing, drying)? 1  Help from another person to put on and taking off regular upper body clothing? 1  Help from another person to put on and taking off regular lower body clothing? 1  6 Click Score 7  OT Goal Progression  Progress towards OT goals Progressing toward goals  OT Time Calculation  OT Start Time (ACUTE ONLY) 1531  OT Stop Time (ACUTE ONLY) 1614  OT Time Calculation (min) 43 min  OT General Charges  $OT Visit 1 Visit  OT Treatments  $Therapeutic Activity 38-52 mins  Nilsa Nutting., OTR/L Acute Rehabilitation Services Pager [phone] Office [phone]

## 2019-10-25 NOTE — Progress Notes (Signed)
  Speech Language Pathology Treatment: Dysphagia;Passy Muir Speaking valve  Patient Details Name: [name] MRN: [id number] DOB: 10/29/1975 Today's Date: 10/25/2019 Time: 7824-2353 SLP Time Calculation (min) (ACUTE ONLY): 23 min  Assessment / Plan / Recommendation Clinical Impression  Pt was alert and coopertive throughout the session. He was able to recall some of the results from yesterday's FEES but required re-education on some of the results. He verbalized understanding and agreement concerning all areas of education. Cuff was deflated upon SLP's arrival and PMSV in place since he had recently finished a session with PT/OT. He tolerated PMSV for 23 minutes with vitals rage RR 19-24, SpO2 91-100, and HR 88-103 during this period. Pt's vocal quality continues to improve and he reported that it is almost back to baseline. He tolerated limited trials of individual ice chips without overt s/sx of aspiration. Dysphagia exercises were deferred during this session due to reported fatigue s/p the prior therapy session. SLP will continue to follow pt.    HPI HPI: Pt is a 44 y/o male admitted 12/14 with COVID pneumonia leading to ARDS and needing mechanical ventilation, pneumomediastinum. He was intubated 12/26-12/29 with trach placement on latter date. PEG tube done on 09/11/19 and replaced on 10/14/19 after dislodging. Decannulated from VV ECMO on 3/4 after 65 days. SLP was consulted for possible PMSV placement. Pt downsized to #6 distal cuffless on 10/22/19      SLP Plan  Continue with current plan of care;New goals to be determined pending instrumental study       Recommendations  Diet recommendations: NPO Medication Administration: Via alternative means      Patient may use Passy-Muir Speech Valve: During all therapies with supervision PMSV Supervision: Full MD: Please consider changing trach tube to : Smaller size         Oral Care Recommendations: Oral care QID Follow up  Recommendations: Inpatient Rehab SLP Visit Diagnosis: Aphonia (R49.1) Plan: Continue with current plan of care;New goals to be determined pending instrumental study       Flannery Cavallero I. Hardin Negus, [place], [place] Office number [phone] Pager [place] 10/25/2019, 6:10 PM

## 2019-10-25 NOTE — Progress Notes (Signed)
Occupational Therapy Treatment Patient Details Name: [name] MRN: [id number] DOB: 02/12/1976 Today's Date: 10/25/2019    History of present illness 44 y/o M admitted 12/14 with COVID pneumonia causing acute hypoxemic respiratory failure.   Developed pneumomediastinum 12/23 with concern for possible bilateral small pneumothoraces on 12/24. Pt intubated on 12/26 and started on ECMO on 12/27. Tracheostomy on 12/29 with bleeding from trach an dother sites on 12/31. Bronch on 1/11 and 1/15. Slowly weaning sedation 2/5. Continuing to wean sedation and starting sweep trials to wean ECMO - Decannulated 3/4   OT comments  Pt continues to demonstrate progress increase in strength in all muscle groups, although he remains very weak.  He is now able to wipe his mouth with Lt UE with max A and support at elbow.  He is able to activate Rt bicep and wrist extensors in gravity eliminated position, but fatigues quickly.  He was restless with increased movement of bil. UEs and LEs noted while fidgeting.  He was very anxious today, and appears to be having difficulty processing what has occurred and the level of disability he is currently experiencing.  Support and encouragement provided.  Lt wrist splint applied, will monitor and establish a splint schedule.   Follow Up Recommendations  CIR;Supervision/Assistance - 24 hour    Equipment Recommendations  None recommended by OT    Recommendations for Other Services      Precautions / Restrictions Precautions Precautions: Fall;Other (comment) Precaution Comments: PEG, trach collar, bilat foot wounds Required Braces or Orthoses: Other Brace Other Brace: B prevalon boots       Mobility Bed Mobility                  Transfers                      Balance                                           ADL either performed or assessed with clinical judgement   ADL                                          General ADL Comments: able to wipe mouth with max A  Lt UE with support at elbow      Vision       Perception     Praxis      Cognition Arousal/Alertness: Awake/alert Behavior During Therapy: Anxious;Restless Overall Cognitive Status: Impaired/Different from baseline Area of Impairment: Attention;Following commands;Problem solving                   Current Attention Level: Selective;Alternating         Problem Solving: Slow processing General Comments: Pt's reponses are slow at times and guarded.  He  appears much more anxious than he did the last time this therapist saw him.  He reports he doesn't know why he is so weak, but when asked what he has been told previously, he is able to provide detailed info of what has previously been discussed with him.   He voices a lot of fear re: what is going to happen and if he will be able to function normally again.         Exercises  General Exercises - Upper Extremity Shoulder Flexion: AAROM;Right;Left;10 reps;Seated Shoulder ABduction: AAROM;Right;Left;10 reps;Seated Elbow Flexion: AAROM;Right;Left;10 reps;Seated Elbow Extension: AROM;AAROM;Right;Left;10 reps;Seated Wrist Flexion: AROM;AAROM;Right;Left;10 reps;Seated Wrist Extension: AROM;AAROM;Right;Left;10 reps;5 reps;Seated Digit Composite Flexion: AROM;Right;Left;10 reps;Seated Composite Extension: AROM;AAROM;Right;Left;10 reps;Seated General Exercises - Lower Extremity Quad Sets: AROM;Right;Left;10 reps;Seated Short Arc Quad: AAROM;Right;Left;10 reps;Seated(in recliner ) Heel Slides: AAROM;Right;Left;10 reps;Seated(recliner ) Hip ABduction/ADduction: AAROM;Right;Left;10 reps;Seated Hip Flexion/Marching: AAROM;Right;Left;10 reps;Seated Other Exercises Other Exercises: pt performed 10 reps scapular depression and 10 reps scapular adduction with 5 second holds actively.  Other Exercises: Rt wrist splint ordered and fitted to pt.  Instructed pt in purpose of splint     Shoulder Instructions       General Comments      Pertinent Vitals/ Pain       Pain Assessment: Faces Faces Pain Scale: Hurts even more Pain Location: bil. knees calves, and buttockx  Pain Descriptors / Indicators: Discomfort;Grimacing;Restless Pain Intervention(s): Monitored during session;Repositioned  Home Living                                          Prior Functioning/Environment              Frequency  Min 3X/week        Progress Toward Goals  OT Goals(current goals can now be found in the care plan section)  Progress towards OT goals: Progressing toward goals     Plan Discharge plan remains appropriate    Co-evaluation                 AM-PAC OT "6 Clicks" Daily Activity     Outcome Measure   Help from another person eating meals?: Total Help from another person taking care of personal grooming?: A Lot Help from another person toileting, which includes using toliet, bedpan, or urinal?: Total Help from another person bathing (including washing, rinsing, drying)?: Total Help from another person to put on and taking off regular upper body clothing?: Total Help from another person to put on and taking off regular lower body clothing?: Total 6 Click Score: 7    End of Session Equipment Utilized During Treatment: Oxygen  OT Visit Diagnosis: Muscle weakness (generalized) (M62.81);Other abnormalities of gait and mobility (R26.89);Other symptoms and signs involving cognitive function;Pain Pain - Right/Left: Right Pain - part of body: Leg;Knee   Activity Tolerance Patient tolerated treatment well   Patient Left in chair;with call bell/phone within reach   Nurse Communication Mobility status        Time: 6438-3779 OT Time Calculation (min): 78 min  Charges: OT General Charges $OT Visit: 1 Visit OT Treatments $Neuromuscular Re-education: 68-82 mins  Nilsa Nutting., OTR/L Acute Rehabilitation Services Pager  [phone] Office [phone]    Lucille Passy M 10/25/2019, 6:31 PM

## 2019-10-25 NOTE — Progress Notes (Signed)
Assisted tele visit to patient with wife.  Mcarthur Rossetti, RN

## 2019-10-25 NOTE — Progress Notes (Signed)
Orthopedic Tech Progress Note Patient Details:  [name] January 31, 1976 [id number]  Ortho Devices Type of Ortho Device: Velcro wrist forearm splint Ortho Device/Splint Location: right Ortho Device/Splint Interventions: Application   Post Interventions Patient Tolerated: Well Instructions Provided: Care of device   [name] 10/25/2019, 11:19 AM

## 2019-10-25 NOTE — Plan of Care (Signed)
  Problem: Education: Goal: Knowledge of risk factors and measures for prevention of condition will improve Outcome: Progressing   Problem: Coping: Goal: Psychosocial and spiritual needs will be supported Outcome: Progressing   Problem: Respiratory: Goal: Complications related to the disease process, condition or treatment will be avoided or minimized Outcome: Progressing   Problem: Respiratory: Goal: Ability to maintain a clear airway and adequate ventilation will improve Outcome: Progressing   Problem: Role Relationship: Goal: Method of communication will improve Outcome: Progressing

## 2019-10-25 NOTE — Progress Notes (Signed)
Assisted tele visit to patient with friends and family  Blu Lori, Philis Nettle, RN

## 2019-10-25 NOTE — Progress Notes (Signed)
Nutrition Brief Note  RD paged regarding vomiting episode after bolus administration.   Patient receiving 474 ml Osmolite 1.5 via pump over one hour four times daily. Patient had 100 ml left of his second dose and vomited. Will transition patient back to continuous feedings over the weekend and reassess next week. Insulin may need to be adjusted to accommodate changes.   Change to:  -Osmolite 1.5 @ 20 ml/hr via PEG -Increase by 10 ml Q4 hours to goal rate of 70 ml/hr (1680 ml) -60 ml Prostat TID  At goal TF provides: 3120 kcal, 195 grams protein, and 1280 ml free water.   [name] RD, LDN Clinical Nutrition Pager listed in [place]

## 2019-10-26 LAB — BASIC METABOLIC PANEL
Anion gap: 9 (ref 5–15)
BUN: 39 mg/dL — ABNORMAL HIGH (ref 6–20)
CO2: 32 mmol/L (ref 22–32)
Calcium: 9.6 mg/dL (ref 8.9–10.3)
Chloride: 103 mmol/L (ref 98–111)
Creatinine, Ser: 0.39 mg/dL — ABNORMAL LOW (ref 0.61–1.24)
GFR calc Af Amer: >60 mL/min (ref >60)
GFR calc non Af Amer: >60 mL/min (ref >60)
Glucose, Bld: 129 mg/dL — ABNORMAL HIGH (ref 70–99)
Potassium: 4 mmol/L (ref 3.5–5.1)
Sodium: 144 mmol/L (ref 135–145)

## 2019-10-26 LAB — CBC
HCT: 37.8 % — ABNORMAL LOW (ref 39.0–52.0)
Hemoglobin: 11 g/dL — ABNORMAL LOW (ref 13.0–17.0)
MCH: 28.1 pg (ref 26.0–34.0)
MCHC: 29.1 g/dL — ABNORMAL LOW (ref 30.0–36.0)
MCV: 96.4 fL (ref 80.0–100.0)
Platelets: 214 10*3/uL (ref 150–400)
RBC: 3.92 MIL/uL — ABNORMAL LOW (ref 4.22–5.81)
RDW: 16.8 % — ABNORMAL HIGH (ref 11.5–15.5)
WBC: 7.7 10*3/uL (ref 4.0–10.5)
nRBC: 0 % (ref 0.0–0.2)

## 2019-10-26 LAB — GLUCOSE, CAPILLARY
Glucose-Capillary: 108 mg/dL — ABNORMAL HIGH (ref 70–99)
Glucose-Capillary: 109 mg/dL — ABNORMAL HIGH (ref 70–99)
Glucose-Capillary: 111 mg/dL — ABNORMAL HIGH (ref 70–99)
Glucose-Capillary: 141 mg/dL — ABNORMAL HIGH (ref 70–99)
Glucose-Capillary: 144 mg/dL — ABNORMAL HIGH (ref 70–99)

## 2019-10-26 LAB — PHOSPHORUS: Phosphorus: 4.6 mg/dL (ref 2.5–4.6)

## 2019-10-26 LAB — MAGNESIUM: Magnesium: 1.8 mg/dL (ref 1.7–2.4)

## 2019-10-26 MED ORDER — SPIRONOLACTONE 12.5 MG HALF TABLET
12.5000 mg | ORAL_TABLET | Freq: Every day | ORAL | Status: DC
  Administered 2019-10-26 – 2019-10-27 (×2): 12.5 mg via ORAL
  Filled 2019-10-26 (×2): qty 1

## 2019-10-26 MED ORDER — CALCIUM CARBONATE ANTACID 500 MG PO CHEW
1.0000 | CHEWABLE_TABLET | Freq: Every day | ORAL | Status: AC
  Administered 2019-10-26: 200 mg via ORAL
  Filled 2019-10-26: qty 1

## 2019-10-26 NOTE — Progress Notes (Signed)
NAME:  [name], MRN:  [id number], DOB:  29-Apr-1976, LOS: 96 ADMISSION DATE:  07/22/2019, CONSULTATION DATE:  12/24 REFERRING MD:  Sloan Leiter, CHIEF COMPLAINT:  Dyspnea   Brief History   44 y/o male admitted 12/14 with COVID pneumonia leading to ARDS, pneumomediastinum.  Intubated 12/26, bilateral chest tubes placed.  Past Medical History  GERD HTN Asthma  Significant Hospital Events   12/14 admit with hypoxemic respiratory failure in setting of COVID-19 pneumonia 12/23 noted to have pneumomediastinum on chest x-ray 12/24PCCM consulted for evaluation 12/26 worsening hypoxemia, intubated 12/27 VV fem/fem ECMO cannulation 12/29 tracheostomy >> See bedside nursing event notes for full rundown of procedures after 12/27 1/5 epistaxis, nasal packing 1/30 improved cxr, improved lung compliance, TV 350s on 15PC  1/31 CXR increased infiltrates, +CFB     2/2 Converted to right IJ Crescent cannula. 2/3 PEG tube inserted at bedside 2/14 30 min sweep trial> paO2 105 (18 PEEP, 60% FiO2) pH 7.22, pCO2 87 2/20 2 hours sweep trial 2/21 1.5 hour sweep trial 2/24 7-hour sweep trial.  Increasingly awake. 3/2 - decannulation attempt aborted due to increased hypercapnia when sedated for procedure 3/4- dcannulated 3/8 PEG tube repositioned by IR 3/9 >> trach wean 3/14 started 24/7 TC  Consults:  PCCM, CTS, CHF, Palliative  Procedures:  PICC 1/3 >> R radial a-line 1/20 >> R IJ ECMO cath 2/3 >> Tracheostomy 12/29 >> 10/14/2019 PEG tube replacement>>  Significant Diagnostic Tests:  CT chest 12/22>>extensive pneumomediastinum, multifocal patchy bilateral groundglass opacities CXR 1/25 >> improving bilateral infiltrates. ECMO cannulae in place. CXR 2/5 >> clearing bilateral infiltrates  Micro Data:  BCx2 12/14 >>negative  Sputum 12/15 >> OPF 12/28 blood > negative 12/29 > staph aureus 1/4 bronch:  1/2 acid fast smear bronch: negative 1/2 fungal cx bronch: negative1/12 BAL:  serratia, staph epidermidis 1/4 BAL > MSSA, Serratia 1/9 blood > negative 1/9 Resp culture > MSSA, serratia 1/11 serratia > negative 1/12 fungus culture > negative 1/12 BAL > serratia 1/18 BAL >> rare Serratia. 1/24 blood > negative 1/30 blood > negative 1/30 resp culture > serratia 2/1 BAL >> rare Serratia - enterococcus 2/1 aspergillus Ag > negative 2/2 resp culture > serratia  2/2 blood > negative 2/5 urine > negative 2/9 resp: serratia 2/15 BAL serratia 3/5 blood cultures ngtd  Antimicrobials:  Received remdesivir, steroids, actemra and convalescent plasma  See MAR for remainder  Interim history/subjective:  No o/n events. Diarrhea improved with TF change. Only issue today is "the way they have my feeds now, I don't ever feel full".   Objective   Blood pressure (!) 154/82, pulse 88, temperature 98.2 F (36.8 C), temperature source Oral, resp. rate 20, height _0  (1.727 m), weight 121.5 kg, SpO2 94 %.    FiO2 (%):  [40 %-80 %] 40 %   Intake/Output Summary (Last 24 hours) at 10/26/2019 1228 Last data filed at 10/26/2019 0700 Gross per 24 hour  Intake 450 ml  Output 1400 ml  Net -950 ml   Filed Weights   10/24/19 0500 10/25/19 0500 10/26/19 0400  Weight: 122.3 kg 121.2 kg 121.5 kg    Examination: GEN: chronically ill appearing HEENT: MMM, trach in place, small mucoid secretions CV: RRR, ext warm PULM: Diminished bases R, CTA L and R upper. GI: PEG in place, soft, +BS EXT: global anasarca NEURO: Moves all 4 ext to command, profound weakness PSYCH: RASS 0  SKIN: Slightly pale, multiple pressure wounds, eschar on lateral feet stable  ABG  Component Value Date/Time   PHART 7.384 10/10/2019 1817   PCO2ART 63.7 (H) 10/10/2019 1817   PO2ART 76.0 (L) 10/10/2019 1817   HCO3 38.0 (H) 10/10/2019 1817   TCO2 40 (H) 10/10/2019 1817   ACIDBASEDEF 4.0 (H) 09/16/2019 2334   O2SAT 84.5 10/20/2019 0500     Resolved Hospital Problem list    Pneumothoraces-resolved.  Dislodged PEG tube Indurated area of the skin right lateral PEG tube.  Serratia pneumonia> treated.   Assessment & Plan:   ARDS due to COVID 19, now requiring mechanical ventilation support at night Decannulated from VV ECMO on 3/4 after 65 days. Tracheostomy tube in place Changed to 6 distal XLT 3/16 - Continue indefinite TC - PMV with SLP - Suctioning PRN  AKI with azotemia - improving  Positive cumulative fluid balance Hypernatremia has corrected Severe third spacing -diuresis per CHF team  Hypertension - stable at this time   Neuromuscular weakness - OOB to chair as much as tolerated; standing today - continued aggressive PT  Bilateral lower feet deep tissue injury Wound care continued for feet  Anemia due to critical illnes Conservative transfusion threshold  Insomnia- on qHS seroquel, melatonin, trazodone, PRN ativan   TF-diarrhea- RD to be consulted to change schedule to minimize night-time awakenings and potentially change formula?  Best practice:  Pain/Anxiety/Delirium protocol (if indicated): - Dilaudid 38m q6h PRN - Precedex- clonidine taper - Valium 2.55mBID PRN - Citalopram 1043maily - Trazodone 100m5mS - PRN ativan qHS - Seroquel 50mg63m (increased today) - Melatonin started today  VAP protocol (if indicated): Bundle in place. Respiratory support goals: Trach collar trial DVT prophylaxis: Lovenox GI prophylaxis: PPI Nutrition: TF Fluid status goals:  See above Urinary catheter: external urinary catheter Glucose control: doing well with SSI alone Mobility/therapy needs: up with PT.  Daily labs: As needed Code Status: Full code  Disposition: to be determined Family: updated when at bedside  Labs   CBC: CBC Latest Ref Rng & Units 10/26/2019 10/25/2019 10/24/2019  WBC 4.0 - 10.5 K/uL 7.7 6.3 7.8  Hemoglobin 13.0 - 17.0 g/dL 11.0(L) 10.4(L) 10.5(L)  Hematocrit 39.0 - 52.0 % 37.8(L) 35.5(L) 36.1(L)  Platelets  150 - 400 K/uL 214 213 236     Basic Metabolic Panel: BMP Latest Ref Rng & Units 10/26/2019 10/25/2019 10/24/2019  Glucose 70 - 99 mg/dL 129(H) 98 92  BUN 6 - 20 mg/dL 39(H) 43(H) 40(H)  Creatinine 0.61 - 1.24 mg/dL 0.39(L) 0.55(L) 0.61  Sodium 135 - 145 mmol/L 144 144 144  Potassium 3.5 - 5.1 mmol/L 4.0 4.1 4.3  Chloride 98 - 111 mmol/L 103 106 105  CO2 22 - 32 mmol/L 32 30 29  Calcium 8.9 - 10.3 mg/dL 9.6 9.0 9.0     GFR: Estimated Creatinine Clearance: 147.8 mL/min (A) (by C-G formula based on SCr of 0.39 mg/dL (L)). Recent Labs  Lab 10/23/19 0440 10/24/19 0327 10/25/19 0354 10/26/19 0450  WBC 7.6 7.8 6.3 7.7    Liver Function Tests: No results for input(s): AST, ALT, ALKPHOS, BILITOT, PROT, ALBUMIN in the last 168 hours. No results for input(s): LIPASE, AMYLASE in the last 168 hours. No results for input(s): AMMONIA in the last 168 hours.  ABG    Component Value Date/Time   PHART 7.384 10/10/2019 1817   PCO2ART 63.7 (H) 10/10/2019 1817   PO2ART 76.0 (L) 10/10/2019 1817   HCO3 38.0 (H) 10/10/2019 1817   TCO2 40 (H) 10/10/2019 1817   ACIDBASEDEF 4.0 (H) 09/16/2019 2334  O2SAT 84.5 10/20/2019 0500     Coagulation Profile: No results for input(s): INR, PROTIME in the last 168 hours.  Cardiac Enzymes: No results for input(s): CKTOTAL, CKMB, CKMBINDEX, TROPONINI in the last 168 hours.  HbA1C: Hgb A1c MFr Bld  Date/Time Value Ref Range Status  09/23/2019 08:58 AM 5.5 4.8 - 5.6 % Final    Comment:    (NOTE) Pre diabetes:          5.7%-6.4% Diabetes:              >6.4% Glycemic control for   <7.0% adults with diabetes   07/24/2019 04:50 AM 6.7 (H) 4.8 - 5.6 % Final    Comment:    (NOTE) Pre diabetes:          5.7%-6.4% Diabetes:              >6.4% Glycemic control for   <7.0% adults with diabetes     CBG: Recent Labs  Lab 10/25/19 1657 10/25/19 2029 10/25/19 2322 10/26/19 0441 10/26/19 0922  GLUCAP 87 103* 114* 109* 111*   I have  independently seen and examined the patient, reviewed data, and developed an assessment and plan. A total of 32 minutes were spent in critical care assessment and medical decision making. This critical care time does not reflect procedure time, or teaching time or supervisory time of PA/NP/Med student/Med Resident, etc but could involve care discussion time.  Bonna Gains, MD, PhD 10/26/2019 12:31 PM

## 2019-10-26 NOTE — Progress Notes (Signed)
Tube feed rate increased to 60 mL.

## 2019-10-26 NOTE — Progress Notes (Signed)
Patient complained of acid reflux, Dr. Maryjean Ka notified,1 tablet calcium carbonate ordered and administered. Patient reported relief.

## 2019-10-26 NOTE — Progress Notes (Signed)
Patient ID: [name], male   DOB: 1975-09-09, 44 y.o.   MRN: [id number]    Advanced Heart Failure Rounding Note   Subjective:    Doing well on TC. Able to talk through [place]  Afebrile. CVP low. BP high   Loose stools have slowed with change of TFs   Objective:   Weight Range:  Vital Signs:   Temp:  [98.1 F (36.7 C)-98.5 F (36.9 C)] 98.2 F (36.8 C) (03/20 0919) Pulse Rate:  [84-107] 98 (03/20 0808) Resp:  [8-25] 21 (03/20 0750) BP: (126-161)/(72-104) 149/85 (03/20 0808) SpO2:  [86 %-100 %] 96 % (03/20 0750) FiO2 (%):  [40 %-80 %] 40 % (03/20 0750) Weight:  [121.5 kg] 121.5 kg (03/20 0400) Last BM Date: 10/25/19  Weight change: Filed Weights   10/24/19 0500 10/25/19 0500 10/26/19 0400  Weight: 122.3 kg 121.2 kg 121.5 kg    Intake/Output:   Intake/Output Summary (Last 24 hours) at 10/26/2019 0945 Last data filed at 10/26/2019 0700 Gross per 24 hour  Intake 450 ml  Output 1400 ml  Net -950 ml     PHYSICAL EXAM: General:  Obese WM w/ TC, No resp difficulty HEENT: normal Neck: supple. no JVD. + TC Carotids 2+ bilat; no bruits. No lymphadenopathy or thryomegaly appreciated. Cor: PMI nondisplaced. Regular rate & rhythm. No rubs, gallops or murmurs. Lungs: coarse Abdomen: obese soft, nontender, nondistended. No hepatosplenomegaly. No bruits or masses. Good bowel sounds. + PEG Extremities: no cyanosis, clubbing, rash, edema +eschar on both feet. (healing) Neuro: alert & orientedx3, cranial nerves grossly intact. Extremities remain weak   Telemetry: SR  80-90s Personally reviewed    Labs: Basic Metabolic Panel: Recent Labs  Lab 10/22/19 0358 10/22/19 0358 10/23/19 0440 10/23/19 0440 10/24/19 0327 10/25/19 0354 10/26/19 0450  NA 142  --  142  --  144 144 144  K 4.0  --  3.7  --  4.3 4.1 4.0  CL 104  --  103  --  105 106 103  CO2 29  --  27  --  29 30 32  GLUCOSE 102*  --  97  --  92 98 129*  BUN 48*  --  47*  --  40* 43* 39*  CREATININE 0.50*   --  0.54*  --  0.61 0.55* 0.39*  CALCIUM 8.8*   < > 9.1   < > 9.0 9.0 9.6  MG 1.9  --  1.8  --  2.1 2.0 1.8  PHOS 3.2  --  3.5  --  4.7* 4.3 4.6   < > = values in this interval not displayed.    Liver Function Tests: No results for input(s): AST, ALT, ALKPHOS, BILITOT, PROT, ALBUMIN in the last 168 hours. No results for input(s): LIPASE, AMYLASE in the last 168 hours. No results for input(s): AMMONIA in the last 168 hours.  CBC: Recent Labs  Lab 10/22/19 0358 10/23/19 0440 10/24/19 0327 10/25/19 0354 10/26/19 0450  WBC 5.8 7.6 7.8 6.3 7.7  HGB 9.9* 10.8* 10.5* 10.4* 11.0*  HCT 34.3* 36.8* 36.1* 35.5* 37.8*  MCV 95.0 93.4 94.5 95.7 96.4  PLT 249 244 236 213 214    Cardiac Enzymes: No results for input(s): CKTOTAL, CKMB, CKMBINDEX, TROPONINI in the last 168 hours.  BNP: BNP (last 3 results) Recent Labs    07/22/19 1039  BNP 15.3    ProBNP (last 3 results) No results for input(s): PROBNP in the last 8760 hours.    Other results:  Imaging: No results found.   Medications:     Scheduled Medications: . acetaminophen (TYLENOL) oral liquid 160 mg/5 mL  650 mg Per Tube Q6H  . amLODipine  10 mg Per Tube Daily  . vitamin C  500 mg Per Tube Daily  . carvedilol  12.5 mg Per Tube BID WC  . chlorhexidine  15 mL Mouth Rinse BID  . Chlorhexidine Gluconate Cloth  6 each Topical Daily  . clonazepam  1 mg Per Tube QHS  . cloNIDine  0.1 mg Per Tube Q12H  . enoxaparin (LOVENOX) injection  60 mg Subcutaneous Q24H  . escitalopram  10 mg Per Tube Daily  . feeding supplement (PRO-STAT SUGAR FREE 64)  60 mL Per Tube TID  . free water  150 mL Per Tube Q6H  . Gerhardt's butt cream   Topical QID  . insulin aspart  0-15 Units Subcutaneous Q4H  . ipratropium-albuterol  3 mL Nebulization TID  . magic mouthwash  5 mL Oral TID  . mouth rinse  15 mL Mouth Rinse q12n4p  . Melatonin  6 mg Per Tube QHS  . montelukast  10 mg Per Tube QHS  . nutrition supplement (JUVEN)  1 packet Per  Tube BID BM  . pantoprazole sodium  40 mg Per Tube BID  . psyllium  1 packet Oral TID  . QUEtiapine  50 mg Per Tube QHS  . sodium chloride flush  10-40 mL Intracatheter Q12H  . traZODone  100 mg Per Tube QHS    Infusions: . sodium chloride Stopped (10/13/19 0548)  . dexmedetomidine (PRECEDEX) IV infusion Stopped (10/22/19 1313)  . feeding supplement (OSMOLITE 1.5 CAL) 50 mL/hr at 10/26/19 0400    PRN Medications: sodium chloride, acetaminophen (TYLENOL) oral liquid 160 mg/5 mL, acetaminophen, albuterol, [COMPLETED] diazepam **FOLLOWED BY** diazepam, diphenoxylate-atropine, hydrALAZINE, HYDROmorphone, LORazepam **OR** LORazepam, ondansetron (ZOFRAN) IV, sodium chloride, sodium chloride flush   Assessment/Plan:    1. Acute hypoxic/hypercarbic respiratory failure due to COVID PNA: Remained markedly hypoxic despite full support. Intubated 12/26. S/p bilateral CTs 12/26 for pneumothoraces.  TEE on 12/26 LVEF 70% RV ok. VV ECMO begun 12/26. Tracheostomy 12/29.  COVID ARDS at this point. ECMO circuit changed 1/8 & 1/23. Crescent cannula placed 2/2 (femoral cannuals removed) - Decannulated 3/4 - On trach collar sats stable.  - no change - continue to wean TC.    2. COVID PNA: management of COVID infection per CCM. CXR with bilateral multifocal PNA progressing to likely ARDS.  - s/p 10 days of remdesivir - 12/16, 12/2 s/p tociluzimab - 12/17 s/p convalescent plasma - Decadron completed 1/11  3. ID: Empiric coverage with vancomycin/cefepime initially for possible secondary bacterial PNA, course completed.   - Completed meropenem  - Afebrile today   4. Anemia: - hgb 10.4  5. FEN - Remains on TFs.  - loose stools improved  5. Hypernatremia -Resolved. Na 144 today -Continue free water.   6. AKI - Resolved.    7. DTIs - On both feet.  Improving  8.  Hypokalemia. Resolved. K 4.0 today  9.  Diffuse muscle weakness. -This is consistent with ICU/steroid/paralytics myopathy.   Continue passive range of motion exercises with PT.   -PT and OT are following.  Both have recommended CIR.  CIR has seen and feel he is not ready yet.  -D/w PT/OT at length will continue aggressive care. Have provided exercise for bedside RNs to help patient    10. HTN -BP elevated, SBPs 150s-160s - Continue amlodipine 10  mg   - Continue Coreg 12.5 bid  - Add spiro 12.5  Length of Stay: 45  Kelin Borum MD 10/26/2019, 9:45 AM  Advanced Heart Failure Team Pager [phone] (M-F; 7a - 4p)  Please contact [facility] for night-coverage after hours (4p -7a ) and weekends on [email or web address]

## 2019-10-26 NOTE — Progress Notes (Signed)
Assisted tele visit to patient with family member.  Favio Moder M, RN  

## 2019-10-27 ENCOUNTER — Inpatient Hospital Stay (HOSPITAL_COMMUNITY): Payer: Managed Care, Other (non HMO)

## 2019-10-27 LAB — CBC
HCT: 38.8 % — ABNORMAL LOW (ref 39.0–52.0)
Hemoglobin: 11.2 g/dL — ABNORMAL LOW (ref 13.0–17.0)
MCH: 27.9 pg (ref 26.0–34.0)
MCHC: 28.9 g/dL — ABNORMAL LOW (ref 30.0–36.0)
MCV: 96.8 fL (ref 80.0–100.0)
Platelets: 189 10*3/uL (ref 150–400)
RBC: 4.01 MIL/uL — ABNORMAL LOW (ref 4.22–5.81)
RDW: 16.9 % — ABNORMAL HIGH (ref 11.5–15.5)
WBC: 7.4 10*3/uL (ref 4.0–10.5)
nRBC: 0 % (ref 0.0–0.2)

## 2019-10-27 LAB — BASIC METABOLIC PANEL
Anion gap: 8 (ref 5–15)
BUN: 39 mg/dL — ABNORMAL HIGH (ref 6–20)
CO2: 34 mmol/L — ABNORMAL HIGH (ref 22–32)
Calcium: 9.3 mg/dL (ref 8.9–10.3)
Chloride: 103 mmol/L (ref 98–111)
Creatinine, Ser: 0.51 mg/dL — ABNORMAL LOW (ref 0.61–1.24)
GFR calc Af Amer: >60 mL/min (ref >60)
GFR calc non Af Amer: >60 mL/min (ref >60)
Glucose, Bld: 120 mg/dL — ABNORMAL HIGH (ref 70–99)
Potassium: 3.9 mmol/L (ref 3.5–5.1)
Sodium: 145 mmol/L (ref 135–145)

## 2019-10-27 LAB — POCT I-STAT 7, (LYTES, BLD GAS, ICA,H+H)
Acid-Base Excess: 11 mmol/L — ABNORMAL HIGH (ref 0.0–2.0)
Bicarbonate: 38 mmol/L — ABNORMAL HIGH (ref 20.0–28.0)
Calcium, Ion: 1.31 mmol/L (ref 1.15–1.40)
HCT: 38 % — ABNORMAL LOW (ref 39.0–52.0)
Hemoglobin: 12.9 g/dL — ABNORMAL LOW (ref 13.0–17.0)
O2 Saturation: 84 %
Patient temperature: 98.1
Potassium: 4.1 mmol/L (ref 3.5–5.1)
Sodium: 146 mmol/L — ABNORMAL HIGH (ref 135–145)
TCO2: 40 mmol/L — ABNORMAL HIGH (ref 22–32)
pCO2 arterial: 57.5 mmHg — ABNORMAL HIGH (ref 32.0–48.0)
pH, Arterial: 7.427 (ref 7.350–7.450)
pO2, Arterial: 48 mmHg — ABNORMAL LOW (ref 83.0–108.0)

## 2019-10-27 LAB — GLUCOSE, CAPILLARY
Glucose-Capillary: 106 mg/dL — ABNORMAL HIGH (ref 70–99)
Glucose-Capillary: 109 mg/dL — ABNORMAL HIGH (ref 70–99)
Glucose-Capillary: 111 mg/dL — ABNORMAL HIGH (ref 70–99)
Glucose-Capillary: 136 mg/dL — ABNORMAL HIGH (ref 70–99)
Glucose-Capillary: 90 mg/dL (ref 70–99)
Glucose-Capillary: 96 mg/dL (ref 70–99)

## 2019-10-27 LAB — MAGNESIUM: Magnesium: 1.7 mg/dL (ref 1.7–2.4)

## 2019-10-27 LAB — PHOSPHORUS: Phosphorus: 4.1 mg/dL (ref 2.5–4.6)

## 2019-10-27 MED ORDER — MAGNESIUM SULFATE 2 GM/50ML IV SOLN
2.0000 g | Freq: Once | INTRAVENOUS | Status: AC
  Administered 2019-10-27: 2 g via INTRAVENOUS
  Filled 2019-10-27: qty 50

## 2019-10-27 MED ORDER — CALCIUM CARBONATE ANTACID 500 MG PO CHEW
1.0000 | CHEWABLE_TABLET | Freq: Three times a day (TID) | ORAL | Status: DC | PRN
  Administered 2019-10-30: 200 mg via ORAL
  Filled 2019-10-27: qty 1

## 2019-10-27 MED ORDER — SPIRONOLACTONE 25 MG PO TABS
25.0000 mg | ORAL_TABLET | Freq: Every day | ORAL | Status: DC
  Administered 2019-10-28 – 2019-11-14 (×18): 25 mg via ORAL
  Filled 2019-10-27 (×18): qty 1

## 2019-10-27 MED ORDER — CALCIUM POLYCARBOPHIL 625 MG PO TABS
625.0000 mg | ORAL_TABLET | Freq: Every day | ORAL | Status: DC
  Administered 2019-10-28 – 2019-11-14 (×18): 625 mg via ORAL
  Filled 2019-10-27 (×19): qty 1

## 2019-10-27 MED ORDER — DIPHENOXYLATE-ATROPINE 2.5-0.025 MG PO TABS: 2.0000 | ORAL_TABLET | Freq: Four times a day (QID) | ORAL | Status: DC | PRN

## 2019-10-27 NOTE — Progress Notes (Signed)
Patient ID: [name], male   DOB: 10-10-1975, 44 y.o.   MRN: [id number]    Advanced Heart Failure Rounding Note   Subjective:    Stable on TC. Able to talk through [place]  Afebrile. CVP low. BP remains high. I added spiro yesterday.   Cc/o acid refulx   Objective:   Weight Range:  Vital Signs:   Temp:  [98 F (36.7 C)-98.8 F (37.1 C)] 98.3 F (36.8 C) (03/21 0807) Pulse Rate:  [85-104] 95 (03/21 0900) Resp:  [17-36] 21 (03/21 0900) BP: (126-159)/(61-92) 157/92 (03/21 0900) SpO2:  [89 %-97 %] 89 % (03/21 0900) FiO2 (%):  [35 %-40 %] 40 % (03/21 0848) Weight:  [121 kg] 121 kg (03/21 0500) Last BM Date: 10/26/19  Weight change: Filed Weights   10/25/19 0500 10/26/19 0400 10/27/19 0500  Weight: 121.2 kg 121.5 kg 121 kg    Intake/Output:   Intake/Output Summary (Last 24 hours) at 10/27/2019 1038 Last data filed at 10/27/2019 0800 Gross per 24 hour  Intake 920 ml  Output 1751 ml  Net -831 ml     PHYSICAL EXAM: General:  Lying in bed in on TC No resp difficulty HEENT: normal Neck: supple. no JVD. Trach site ok Carotids 2+ bilat; no bruits. No lymphadenopathy or thryomegaly appreciated. Cor: PMI nondisplaced. Regular rate & rhythm. No rubs, gallops or murmurs. Lungs: coarse Abdomen: obese soft, nontender, nondistended. No hepatosplenomegaly. No bruits or masses. Good bowel sounds. + PEG Extremities: no cyanosis, clubbing, rash, edema eschar on feet Neuro: alert & orientedx3, cranial nerves grossly intact. moves all 4 extremities w/o difficulty. Affect pleasant   Telemetry: SR  80-90s Personally reviewed    Labs: Basic Metabolic Panel: Recent Labs  Lab 10/23/19 0440 10/23/19 0440 10/24/19 0327 10/24/19 0327 10/25/19 0354 10/26/19 0450 10/27/19 0444  NA 142  --  144  --  144 144 145  K 3.7  --  4.3  --  4.1 4.0 3.9  CL 103  --  105  --  106 103 103  CO2 27  --  29  --  30 32 34*  GLUCOSE 97  --  92  --  98 129* 120*  BUN 47*  --  40*  --  43*  39* 39*  CREATININE 0.54*  --  0.61  --  0.55* 0.39* 0.51*  CALCIUM 9.1   < > 9.0   < > 9.0 9.6 9.3  MG 1.8  --  2.1  --  2.0 1.8 1.7  PHOS 3.5  --  4.7*  --  4.3 4.6 4.1   < > = values in this interval not displayed.    Liver Function Tests: No results for input(s): AST, ALT, ALKPHOS, BILITOT, PROT, ALBUMIN in the last 168 hours. No results for input(s): LIPASE, AMYLASE in the last 168 hours. No results for input(s): AMMONIA in the last 168 hours.  CBC: Recent Labs  Lab 10/23/19 0440 10/24/19 0327 10/25/19 0354 10/26/19 0450 10/27/19 0444  WBC 7.6 7.8 6.3 7.7 7.4  HGB 10.8* 10.5* 10.4* 11.0* 11.2*  HCT 36.8* 36.1* 35.5* 37.8* 38.8*  MCV 93.4 94.5 95.7 96.4 96.8  PLT 244 236 213 214 189    Cardiac Enzymes: No results for input(s): CKTOTAL, CKMB, CKMBINDEX, TROPONINI in the last 168 hours.  BNP: BNP (last 3 results) Recent Labs    07/22/19 1039  BNP 15.3    ProBNP (last 3 results) No results for input(s): PROBNP in the last 8760 hours.  Other results:  Imaging: DG CHEST PORT 1 VIEW  Result Date: 10/27/2019 CLINICAL DATA:  Respiratory difficulty. EXAM: PORTABLE CHEST 1 VIEW COMPARISON:  October 22, 2019. FINDINGS: Stable cardiomegaly. Tracheostomy tube is unchanged in position. Right-sided PICC line is unchanged. Hypoinflation of the lungs is noted. No pneumothorax or pleural effusion is noted. Stable bilateral lung opacities are noted concerning for multifocal pneumonia. Bony thorax is unremarkable. IMPRESSION: Stable bilateral lung opacities are noted concerning for multifocal pneumonia. Electronically Signed   By: Marijo Conception M.D.   On: 10/27/2019 08:58     Medications:     Scheduled Medications: . acetaminophen (TYLENOL) oral liquid 160 mg/5 mL  650 mg Per Tube Q6H  . amLODipine  10 mg Per Tube Daily  . vitamin C  500 mg Per Tube Daily  . carvedilol  12.5 mg Per Tube BID WC  . chlorhexidine  15 mL Mouth Rinse BID  . Chlorhexidine Gluconate Cloth  6  each Topical Daily  . clonazepam  1 mg Per Tube QHS  . cloNIDine  0.1 mg Per Tube Q12H  . enoxaparin (LOVENOX) injection  60 mg Subcutaneous Q24H  . escitalopram  10 mg Per Tube Daily  . feeding supplement (PRO-STAT SUGAR FREE 64)  60 mL Per Tube TID  . free water  150 mL Per Tube Q6H  . Gerhardt's butt cream   Topical QID  . insulin aspart  0-15 Units Subcutaneous Q4H  . ipratropium-albuterol  3 mL Nebulization TID  . magic mouthwash  5 mL Oral TID  . mouth rinse  15 mL Mouth Rinse q12n4p  . Melatonin  6 mg Per Tube QHS  . montelukast  10 mg Per Tube QHS  . nutrition supplement (JUVEN)  1 packet Per Tube BID BM  . pantoprazole sodium  40 mg Per Tube BID  . psyllium  1 packet Oral TID  . QUEtiapine  50 mg Per Tube QHS  . sodium chloride flush  10-40 mL Intracatheter Q12H  . spironolactone  12.5 mg Oral Daily  . traZODone  100 mg Per Tube QHS    Infusions: . sodium chloride Stopped (10/13/19 0548)  . dexmedetomidine (PRECEDEX) IV infusion Stopped (10/22/19 1313)  . feeding supplement (OSMOLITE 1.5 CAL) 70 mL/hr at 10/26/19 1917    PRN Medications: sodium chloride, acetaminophen (TYLENOL) oral liquid 160 mg/5 mL, acetaminophen, albuterol, [COMPLETED] diazepam **FOLLOWED BY** diazepam, diphenoxylate-atropine, hydrALAZINE, HYDROmorphone, LORazepam **OR** LORazepam, ondansetron (ZOFRAN) IV, sodium chloride, sodium chloride flush   Assessment/Plan:    1. Acute hypoxic/hypercarbic respiratory failure due to COVID PNA: Remained markedly hypoxic despite full support. Intubated 12/26. S/p bilateral CTs 12/26 for pneumothoraces.  TEE on 12/26 LVEF 70% RV ok. VV ECMO begun 12/26. Tracheostomy 12/29.  COVID ARDS at this point. ECMO circuit changed 1/8 & 1/23. Crescent cannula placed 2/2 (femoral cannuals removed) - Decannulated 3/4 - On trach collar stable. hopefully can work toward decannulation   2. COVID PNA: management of COVID infection per CCM. CXR with bilateral multifocal PNA  progressing to likely ARDS.  - s/p 10 days of remdesivir - 12/16, 12/2 s/p tociluzimab - 12/17 s/p convalescent plasma - Decadron completed 1/11  3. ID: Empiric coverage with vancomycin/cefepime initially for possible secondary bacterial PNA, course completed.   - remains afebrile  4. Anemia: - hgb 11.2  5. FEN - Remains on TFs. Loose stools improved with change in regimen  5. Hypernatremia -NA 145 today -Continue free water.   6. AKI - Resolved.  7. DTIs - On both feet.  Improving  8.  Hypokalemia. - K 3.9 today  9.  Diffuse muscle weakness. -This is consistent with ICU/steroid/paralytics myopathy.  Continue passive range of motion exercises with PT.   -PT and OT are following.  Both have recommended CIR.  CIR has seen and feel he is not ready yet.  -D/w PT/OT at length will continue aggressive care. Have provided exercise for bedside RNs to help patient    10. HTN -BP elevated, SBPs 150s-160s - Continue amlodipine 10 mg   - Continue Coreg 12.5 bid  - Increase spiro to 25  11. Hypomag - supp   Length of Stay: 74  Xitlali Kastens MD 10/27/2019, 10:38 AM  Advanced Heart Failure Team Pager [phone] (M-F; 7a - 4p)  Please contact [facility] for night-coverage after hours (4p -7a ) and weekends on [email or web address]

## 2019-10-27 NOTE — Progress Notes (Addendum)
Patient sitting up in chair, spo2 83-86. ABG ordered: ph 7.4 PCO2 58 PO2 49. Mentation and breathing normal, Dr. Maryjean Ka notified. Patient expressed wish to switch to a regular hospital bed.

## 2019-10-27 NOTE — Progress Notes (Signed)
RT called by RN that patient feels like he needs to be suctioned. RN attempted but patient still felt like he needed suctioning. RT suctioned patient and received moderate tan; [name] secretions. During suctioning and coughing patient dropped his sats to mid-low 80's. Checked inner cannula and was clean, no occlusions. Patient stated he felt better after suctioning despite oxygen sat dropping. FIO2 increased to 80% to recover. SPO2 increased to 98%. FIO2 decreased to 60%. RT will continue to wean as able. RT will continue to monitor. Patient vitals stable at this time. Family at bedside.

## 2019-10-28 ENCOUNTER — Telehealth: Payer: Self-pay | Admitting: Allergy & Immunology

## 2019-10-28 LAB — BASIC METABOLIC PANEL
Anion gap: 11 (ref 5–15)
BUN: 39 mg/dL — ABNORMAL HIGH (ref 6–20)
CO2: 33 mmol/L — ABNORMAL HIGH (ref 22–32)
Calcium: 9.3 mg/dL (ref 8.9–10.3)
Chloride: 101 mmol/L (ref 98–111)
Creatinine, Ser: 0.51 mg/dL — ABNORMAL LOW (ref 0.61–1.24)
GFR calc Af Amer: >60 mL/min (ref >60)
GFR calc non Af Amer: >60 mL/min (ref >60)
Glucose, Bld: 94 mg/dL (ref 70–99)
Potassium: 3.8 mmol/L (ref 3.5–5.1)
Sodium: 145 mmol/L (ref 135–145)

## 2019-10-28 LAB — CBC
HCT: 37.4 % — ABNORMAL LOW (ref 39.0–52.0)
Hemoglobin: 10.9 g/dL — ABNORMAL LOW (ref 13.0–17.0)
MCH: 27.7 pg (ref 26.0–34.0)
MCHC: 29.1 g/dL — ABNORMAL LOW (ref 30.0–36.0)
MCV: 95.2 fL (ref 80.0–100.0)
Platelets: 186 10*3/uL (ref 150–400)
RBC: 3.93 MIL/uL — ABNORMAL LOW (ref 4.22–5.81)
RDW: 16.7 % — ABNORMAL HIGH (ref 11.5–15.5)
WBC: 6.3 10*3/uL (ref 4.0–10.5)
nRBC: 0 % (ref 0.0–0.2)

## 2019-10-28 LAB — GLUCOSE, CAPILLARY
Glucose-Capillary: 75 mg/dL (ref 70–99)
Glucose-Capillary: 90 mg/dL (ref 70–99)
Glucose-Capillary: 109 mg/dL — ABNORMAL HIGH (ref 70–99)
Glucose-Capillary: 83 mg/dL (ref 70–99)
Glucose-Capillary: 114 mg/dL — ABNORMAL HIGH (ref 70–99)
Glucose-Capillary: 98 mg/dL (ref 70–99)

## 2019-10-28 LAB — MAGNESIUM: Magnesium: 1.9 mg/dL (ref 1.7–2.4)

## 2019-10-28 LAB — PHOSPHORUS: Phosphorus: 4.4 mg/dL (ref 2.5–4.6)

## 2019-10-28 MED ORDER — GERHARDT'S BUTT CREAM
TOPICAL_CREAM | Freq: Every day | CUTANEOUS | Status: DC
  Administered 2019-11-04 – 2019-11-11 (×2): 1 via TOPICAL
  Filled 2019-10-28 (×2): qty 1

## 2019-10-28 MED ORDER — POTASSIUM CHLORIDE 20 MEQ/15ML (10%) PO SOLN
20.0000 meq | Freq: Once | ORAL | Status: AC
  Administered 2019-10-28: 20 meq
  Filled 2019-10-28: qty 15

## 2019-10-28 MED ORDER — MAGNESIUM SULFATE 2 GM/50ML IV SOLN
2.0000 g | Freq: Once | INTRAVENOUS | Status: AC
  Administered 2019-10-28: 2 g via INTRAVENOUS
  Filled 2019-10-28: qty 50

## 2019-10-28 MED ORDER — CLONIDINE HCL 0.1 MG PO TABS
0.1000 mg | ORAL_TABLET | Freq: Every day | ORAL | Status: DC
  Administered 2019-10-29 – 2019-10-31 (×3): 0.1 mg via ORAL
  Filled 2019-10-28 (×3): qty 1

## 2019-10-28 NOTE — Progress Notes (Signed)
OT Treatment Note  Pt more animated and vocal this session. Much less anxiety with tilting today. Able to tolerate tolt of 45 degrees with VSS. Pt on 10L 40 % FiO2 with SpO2 85-90. Music used during session with pt bobbing head to beat and at one time simulating air guitar. Completed mini-squats in standing; incorpoated rotational movements in standing to increase core and shoulder girdle strength. BUE strength slowly improving -Pt appears to be getting impinged due to abnormal scapulohumeral rhythm. May trial use of kinesiotape to improve position of scapula and humeral head to reduce symptoms of impingement. Endurance for activity continues to improve. Pt continues to want to work on a journal so he can know events that have happened while in ICU. Very motivated to improve. Will continue to follow acutely to increase ability to particapte at CIR level therapies.     10/28/19 1600  OT Visit Information  Last OT Received On 10/28/19  Assistance Needed +3 or more  PT/OT/SLP Co-Evaluation/Treatment Yes  OT goals addressed during session ADL's and self-care;Strengthening/ROM  History of Present Illness 44 y/o M admitted 12/14 with COVID pneumonia causing acute hypoxemic respiratory failure.   Developed pneumomediastinum 12/23 with concern for possible bilateral small pneumothoraces on 12/24. Pt intubated on 12/26 and started on ECMO on 12/27. Tracheostomy on 12/29 with bleeding from trach an dother sites on 12/31. Bronch on 1/11 and 1/15. Slowly weaning sedation 2/5. Continuing to wean sedation and starting sweep trials to wean ECMO - Decannulated 3/4  Precautions  Precautions Fall;Other (comment)  Precaution Comments PEG, trach collar, bilat foot wounds  Required Braces or Orthoses Other Brace  Pain Assessment  Pain Assessment Faces  Faces Pain Scale 4  Pain Location bil calves  Pain Descriptors / Indicators Discomfort;Grimacing;Restless  Pain Intervention(s) Limited activity within patient's  tolerance  Cognition  Arousal/Alertness Awake/alert  Behavior During Therapy WFL for tasks assessed/performed  Overall Cognitive Status Within Functional Limits for tasks assessed  General Comments Improved cognition; less anxious about standing; ; more interactive adn talking with therapists about music/80s, more animated  Difficult to assess due to Tracheostomy  Upper Extremity Assessment  RUE Deficits / Details When pt placed in gravity eliminated position, he is able to extend wrist to neutral x 4 before fatiging, and able to activate elbow flexion x 6 before fatiguing (1/5).   finger flexion ~50% composite flexion actively, shoulder 2-/5; tricep at least 3+/5   RUE Coordination decreased fine motor;decreased gross motor  LUE Deficits / Details Pt with 1+/5 - 2-/5 elbow flexion, tricpe at least 3+/5; shoulder 2-/5; wrist 2+/5 ext, 2/5 flexion; and able to achieve ~80% composite flexion   LUE Coordination decreased fine motor;decreased gross motor  Lower Extremity Assessment  Lower Extremity Assessment Defer to PT evaluation  ADL  Grooming Maximal assistance;Wash/dry face;Oral care  Bed Mobility  Overal bed mobility Needs Assistance  General bed mobility comments Pt in Kreg Tilt bed. Tilted bed to 30 degrees for 4 minutes. Then incr tilt to 45 degrees for 15 minutes. As pt fatigued he began having difficulty keeping knees fully extended. Then returned bed to 30 degrees for 8 minutes.   Transfers  General transfer comment Used Kreg Tilt bed  General Exercises - Upper Extremity  Shoulder Flexion AAROM;Right;Left;10 reps;Seated  Shoulder ABduction AAROM;Right;Left;10 reps;Seated  Elbow Flexion AAROM;Right;Left;10 reps;Seated  Elbow Extension AROM;AAROM;Right;Left;10 reps;Seated  Wrist Flexion AROM;AAROM;Right;Left;10 reps;Seated  Digit Composite Flexion AROM;Right;Left;10 reps;Seated  Composite Extension AROM;AAROM;Right;Left;10 reps;Seated  Shoulder Extension AAROM;AROM;Both;10  reps;Seated  Shoulder ADduction  AROM;AAROM;Both;10 reps;Seated  General Exercises - Lower Extremity  Mini-Sqauts  (on Kreg tilt bed at 45 Degrees)  Other Exercises  Other Exercises facilitated trunk rotation while tilted @ 30 degrees to incorporate core adn shoulder. Pt using siderail to use UE to push back onto bed. encorporating activitied to cross midline and move into PNF diagonal wtih increased attention to head/eyes following hand to address cervical rotation  Other Exercises minimsquats x 10 while tilted @ 45 degrees  OT - End of Session  Equipment Utilized During Treatment Oxygen (10L 40% FiO2 during session)  Activity Tolerance Patient tolerated treatment well  Patient left in bed;with call bell/phone within reach;with SCD's reapplied;with nursing/sitter in room  Nurse Communication Mobility status  OT Assessment/Plan  OT Plan Discharge plan remains appropriate  OT Visit Diagnosis Muscle weakness (generalized) (M62.81);Other abnormalities of gait and mobility (R26.89);Other symptoms and signs involving cognitive function;Pain  Pain - Right/Left  (B shoulder with shoulder flex; general discomfort)  OT Frequency (ACUTE ONLY) Min 3X/week  Follow Up Recommendations CIR;Supervision/Assistance - 24 hour  OT Equipment None recommended by OT  AM-PAC OT "6 Clicks" Daily Activity Outcome Measure (Version 2)  Help from another person eating meals? 1  Help from another person taking care of personal grooming? 2  Help from another person toileting, which includes using toliet, bedpan, or urinal? 1  Help from another person bathing (including washing, rinsing, drying)? 1  Help from another person to put on and taking off regular upper body clothing? 1  Help from another person to put on and taking off regular lower body clothing? 1  6 Click Score 7  OT Goal Progression  Progress towards OT goals Progressing toward goals  Acute Rehab OT Goals  Patient Stated Goal regain strength and  independence  OT Goal Formulation With patient  Time For Goal Achievement 10/30/19  Potential to Achieve Goals Good  ADL Goals  Pt Will Perform Grooming standing;Independently  Additional ADL Goal #1 Pt will maintain midline postural control in supported sitting x 5 min with min guard A in preparation for ADL training  Additional ADL Goal #2 Pt will complete sit - stand activity with Max A +2 x 1 min with use of lift equipment  to increase strength for functional mobility  Additional ADL Goal #3 Pt will be able to move Lt UE to mouth with mod A  Additional ADL Goal #4 Pt will demonstrate B active elbow flexion in gravity eliminated position to increase funcitonal use of arms  Pt Will Perform Upper Body Bathing with max assist;sitting  OT Time Calculation  OT Start Time (ACUTE ONLY) 1040  OT Stop Time (ACUTE ONLY) 1130  OT Time Calculation (min) 50 min  OT General Charges  $OT Visit 1 Visit  OT Treatments  $Neuromuscular Re-education 8-22 mins  Maurie Boettcher, OT/L   Acute OT Clinical Specialist Acute Rehabilitation Services Pager [phone] Office [phone]

## 2019-10-28 NOTE — Plan of Care (Signed)
Updated. [name]

## 2019-10-28 NOTE — Progress Notes (Signed)
Verticalization done for 40 minutes at 26 degrees. Vital signs stable through out therapy

## 2019-10-28 NOTE — Consult Note (Signed)
[facility] Nurse wound follow up Patient receiving care at [facility] type:Eschar to bilateral feet  Measurement: right: 18.0  cm x 6.0 cm dry blood blister (eschar) Left: 20.0 cm x 6.0 cm  Wound FMM:CRFVOH dry eschar from blood blister resolving Drainage (amount, consistency, odor) dry and intact  None noted today.  Periwound:intact Dressing procedure/placement/frequency: Continue betadine and foam dressing.  Will assess every 7-10 days.  Austin Miles, RN, WTA-C Wound, Ostomy, Continence Nurse Pager [phone]

## 2019-10-28 NOTE — Progress Notes (Signed)
Patient ID: [name], male   DOB: [birth year], 44 y.o.   MRN: [id number]    Advanced Heart Failure Rounding Note   Subjective:    Stable on TC. Able to talk through  [facility]  Working with RN doing exercise. BP improved. Was up in chair yesterday.   Objective:   Weight Range:  Vital Signs:   Temp:  [98.1 F (36.7 C)-98.5 F (36.9 C)] 98.4 F (36.9 C) (03/22 0727) Pulse Rate:  [79-100] 94 (03/22 0600) Resp:  [16-24] 19 (03/22 0600) BP: (100-165)/(43-94) 135/89 (03/22 0600) SpO2:  [89 %-98 %] 98 % (03/22 0727) FiO2 (%):  [40 %-98 %] 40 % (03/22 0727) Weight:  [120.8 kg] 120.8 kg (03/22 0500) Last BM Date: 10/27/19  Weight change: Filed Weights   10/26/19 0400 10/27/19 0500 10/28/19 0500  Weight: 121.5 kg 121 kg 120.8 kg    Intake/Output:   Intake/Output Summary (Last 24 hours) at 10/28/2019 0907 Last data filed at 10/28/2019 0630 Gross per 24 hour  Intake 1525.98 ml  Output 900 ml  Net 625.98 ml     PHYSICAL EXAM: General:  Sitting up in bed on TC No resp difficulty HEENT: normal Neck: supple. no JVD. + TC Carotids 2+ bilat; no bruits. No lymphadenopathy or thryomegaly appreciated. Cor: PMI nondisplaced. Regular rate & rhythm. No rubs, gallops or murmurs. Lungs: clear Abdomen: obese soft, nontender, nondistended. No hepatosplenomegaly. No bruits or masses. Good bowel sounds. + PEG Extremities: no cyanosis, clubbing, rash, edema eschar on feet Neuro: alert & orientedx3, cranial nerves grossly intact. moves all 4 extremities w/o difficulty. Affect pleasant    Telemetry: SR  90s Personally reviewed    Labs: Basic Metabolic Panel: Recent Labs  Lab 10/24/19 0327 10/24/19 0327 10/25/19 0354 10/25/19 0354 10/26/19 0450 10/27/19 0444 10/27/19 1635 10/28/19 0404  NA 144   < > 144  --  144 145 146* 145  K 4.3   < > 4.1  --  4.0 3.9 4.1 3.8  CL 105  --  106  --  103 103  --  101  CO2 29  --  30  --  32 34*  --  33*  GLUCOSE 92  --  98  --  129* 120*  --   94  BUN 40*  --  43*  --  39* 39*  --  39*  CREATININE 0.61  --  0.55*  --  0.39* 0.51*  --  0.51*  CALCIUM 9.0   < > 9.0   < > 9.6 9.3  --  9.3  MG 2.1  --  2.0  --  1.8 1.7  --  1.9  PHOS 4.7*  --  4.3  --  4.6 4.1  --  4.4   < > = values in this interval not displayed.    Liver Function Tests: No results for input(s): AST, ALT, ALKPHOS, BILITOT, PROT, ALBUMIN in the last 168 hours. No results for input(s): LIPASE, AMYLASE in the last 168 hours. No results for input(s): AMMONIA in the last 168 hours.  CBC: Recent Labs  Lab 10/24/19 0327 10/24/19 0327 10/25/19 0354 10/26/19 0450 10/27/19 0444 10/27/19 1635 10/28/19 0404  WBC 7.8  --  6.3 7.7 7.4  --  6.3  HGB 10.5*   < > 10.4* 11.0* 11.2* 12.9* 10.9*  HCT 36.1*   < > 35.5* 37.8* 38.8* 38.0* 37.4*  MCV 94.5  --  95.7 96.4 96.8  --  95.2  PLT 236  --  213 214 189  --  186   < > = values in this interval not displayed.    Cardiac Enzymes: No results for input(s): CKTOTAL, CKMB, CKMBINDEX, TROPONINI in the last 168 hours.  BNP: BNP (last 3 results) Recent Labs    07/22/19 1039  BNP 15.3    ProBNP (last 3 results) No results for input(s): PROBNP in the last 8760 hours.    Other results:  Imaging: DG CHEST PORT 1 VIEW  Result Date: 10/27/2019 CLINICAL DATA:  Respiratory difficulty. EXAM: PORTABLE CHEST 1 VIEW COMPARISON:  October 22, 2019. FINDINGS: Stable cardiomegaly. Tracheostomy tube is unchanged in position. Right-sided PICC line is unchanged. Hypoinflation of the lungs is noted. No pneumothorax or pleural effusion is noted. Stable bilateral lung opacities are noted concerning for multifocal pneumonia. Bony thorax is unremarkable. IMPRESSION: Stable bilateral lung opacities are noted concerning for multifocal pneumonia. Electronically Signed   By: Marijo Conception M.D.   On: 10/27/2019 08:58     Medications:     Scheduled Medications: . acetaminophen (TYLENOL) oral liquid 160 mg/5 mL  650 mg Per Tube Q6H  .  amLODipine  10 mg Per Tube Daily  . vitamin C  500 mg Per Tube Daily  . carvedilol  12.5 mg Per Tube BID WC  . chlorhexidine  15 mL Mouth Rinse BID  . Chlorhexidine Gluconate Cloth  6 each Topical Daily  . clonazepam  1 mg Per Tube QHS  . cloNIDine  0.1 mg Per Tube Q12H  . enoxaparin (LOVENOX) injection  60 mg Subcutaneous Q24H  . escitalopram  10 mg Per Tube Daily  . feeding supplement (PRO-STAT SUGAR FREE 64)  60 mL Per Tube TID  . free water  150 mL Per Tube Q6H  . Gerhardt's butt cream   Topical QID  . insulin aspart  0-15 Units Subcutaneous Q4H  . ipratropium-albuterol  3 mL Nebulization TID  . magic mouthwash  5 mL Oral TID  . mouth rinse  15 mL Mouth Rinse q12n4p  . Melatonin  6 mg Per Tube QHS  . montelukast  10 mg Per Tube QHS  . nutrition supplement (JUVEN)  1 packet Per Tube BID BM  . pantoprazole sodium  40 mg Per Tube BID  . polycarbophil  625 mg Oral Daily  . psyllium  1 packet Oral TID  . QUEtiapine  50 mg Per Tube QHS  . sodium chloride flush  10-40 mL Intracatheter Q12H  . spironolactone  25 mg Oral Daily  . traZODone  100 mg Per Tube QHS    Infusions: . sodium chloride Stopped (10/13/19 0548)  . dexmedetomidine (PRECEDEX) IV infusion Stopped (10/22/19 1313)  . feeding supplement (OSMOLITE 1.5 CAL) 1,000 mL (10/28/19 0606)    PRN Medications: sodium chloride, acetaminophen (TYLENOL) oral liquid 160 mg/5 mL, acetaminophen, albuterol, calcium carbonate, [COMPLETED] diazepam **FOLLOWED BY** diazepam, diphenoxylate-atropine, diphenoxylate-atropine, hydrALAZINE, HYDROmorphone, LORazepam **OR** LORazepam, ondansetron (ZOFRAN) IV, sodium chloride, sodium chloride flush   Assessment/Plan:    1. Acute hypoxic/hypercarbic respiratory failure due to COVID PNA: Remained markedly hypoxic despite full support. Intubated 12/26. S/p bilateral CTs 12/26 for pneumothoraces.  TEE on 12/26 LVEF 70% RV ok. VV ECMO begun 12/26. Tracheostomy 12/29.  COVID ARDS at this point. ECMO  circuit changed 1/8 & 1/23. Crescent cannula placed 2/2 (femoral cannuals removed) - Decannulated 3/4 - On trach collar stable. hopefully can work toward decannulation. D/w CCM today - No change  2. COVID PNA: management of COVID infection per CCM. CXR  with bilateral multifocal PNA progressing to likely ARDS.  - s/p 10 days of remdesivir - 12/16, 12/2 s/p tociluzimab - 12/17 s/p convalescent plasma - Decadron completed 1/11  3. ID: Empiric coverage with vancomycin/cefepime initially for possible secondary bacterial PNA, course completed.   - remains afebrile  4. Anemia: - hgb 10.9 - likely can change blood draws to qOD  5. FEN - Remains on TFs. Loose stools improved with change in regimen  5. Hypernatremia -Na stable 145  -Continue free water.   6. AKI - Resolved.    7. DTIs - On both feet.  Improving  8.  Hypokalemia. - K 3.8 today. Will supp  9.  Diffuse muscle weakness. -This is consistent with ICU/steroid/paralytics myopathy.  Continue passive range of motion exercises with PT.   -PT and OT are following.  Both have recommended CIR.  CIR has seen and feel he is not ready yet.  -D/w PT/OT at length will continue aggressive care. Have provided exercise for bedside RNs to help patient    10. HTN - Blood pressure well controlled. Continue current regimen.   11. Hypomag - supp   Length of Stay: 57  Nigel Ericsson MD 10/28/2019, 9:07 AM  Advanced Heart Failure Team Pager [phone] (M-F; 7a - 4p)  Please contact [facility] for night-coverage after hours (4p -7a ) and weekends on [email or web address]

## 2019-10-28 NOTE — Telephone Encounter (Signed)
Patient's wife called and she is filling out disability papers for [employer] and she wants to know if Dr. Ernst Bowler is treating Joncarlos for GERD, along with his allergies and asthma. She also needs names of medications he is on.

## 2019-10-28 NOTE — Progress Notes (Signed)
Physical Therapy Treatment Patient Details Name: [name] MRN: [id number] DOB: [birth year] Today's Date: 10/28/2019    History of Present Illness 44 y/o M admitted 12/14 with COVID pneumonia causing acute hypoxemic respiratory failure.   Developed pneumomediastinum 12/23 with concern for possible bilateral small pneumothoraces on 12/24. Pt intubated on 12/26 and started on ECMO on 12/27. Tracheostomy on 12/29 with bleeding from trach an dother sites on 12/31. Bronch on 1/11 and 1/15. Slowly weaning sedation 2/5. Continuing to wean sedation and starting sweep trials to wean ECMO - Decannulated 3/4    PT Comments    Pt making good progress. He is tolerating standing with tilt bed and is very motivated to participate in all activities. Continue to recommend CIR.    Follow Up Recommendations  CIR;Supervision/Assistance - 24 hour     Equipment Recommendations  Other (comment)(To be determined)    Recommendations for Other Services       Precautions / Restrictions Precautions Precautions: Fall;Other (comment) Precaution Comments: PEG, trach collar, bilat foot wounds Required Braces or Orthoses: Other Brace    Mobility  Bed Mobility Overal bed mobility: Needs Assistance             General bed mobility comments: Pt in Kreg Tilt bed. Tilted bed to 30 degrees for 4 minutes. Then incr tilt to 45 degrees for 15 minutes. As pt fatigued he began having difficulty keeping knees fully extended. Then returned bed to 30 degrees for 8 minutes.   Transfers                    Ambulation/Gait                 Stairs             Wheelchair Mobility    Modified Rankin (Stroke Patients Only)       Balance                                            Cognition Arousal/Alertness: Awake/alert Behavior During Therapy: WFL for tasks assessed/performed Overall Cognitive Status: Within Functional Limits for tasks assessed                                         Exercises General Exercises - Lower Extremity Quad Sets: AROM;Both;10 reps;Seated Short Arc Quad: AAROM;10 reps;Supine;Both Heel Slides: AAROM;10 reps;Supine;Both Hip ABduction/ADduction: AAROM;10 reps;Both;Supine Hip Flexion/Marching: AAROM;Right;Left;10 reps;Seated Mini-Sqauts: Both;20 reps;Standing(on Kreg tilt bed at 45 Degrees) Other Exercises Other Exercises: Performed active assisted trunk rotation x 10 to each side at 30 degrees on Kreg tilt bed    General Comments General comments (skin integrity, edema, etc.): Pt on trach collar with PMV. Several times pt with SpO2 to mid 80's. removed PMV for short time to allow incr SpO2.       Pertinent Vitals/Pain Pain Assessment: Faces Faces Pain Scale: Hurts little more Pain Location: bil calves Pain Descriptors / Indicators: Discomfort;Grimacing;Restless Pain Intervention(s): Limited activity within patient's tolerance;Monitored during session;Repositioned    Home Living                      Prior Function            PT Goals (current goals can now be found in the care  plan section) Progress towards PT goals: Progressing toward goals    Frequency    Min 3X/week      PT Plan Current plan remains appropriate    Co-evaluation              AM-PAC PT "6 Clicks" Mobility   Outcome Measure  Help needed turning from your back to your side while in a flat bed without using bedrails?: Total Help needed moving from lying on your back to sitting on the side of a flat bed without using bedrails?: Total Help needed moving to and from a bed to a chair (including a wheelchair)?: Total Help needed standing up from a chair using your arms (e.g., wheelchair or bedside chair)?: Total Help needed to walk in hospital room?: Total Help needed climbing 3-5 steps with a railing? : Total 6 Click Score: 6    End of Session Equipment Utilized During Treatment: Oxygen Activity Tolerance:  Patient tolerated treatment well Patient left: in bed;with call bell/phone within reach Nurse Communication: Mobility status PT Visit Diagnosis: Muscle weakness (generalized) (M62.81);Other symptoms and signs involving the nervous system (R29.898);Other abnormalities of gait and mobility (R26.89)     Time: 9833-8250 PT Time Calculation (min) (ACUTE ONLY): 62 min  Charges:  $Therapeutic Activity: 23-37 mins                     [place] Pager [phone] Office [place] 10/28/2019, 4:08 PM

## 2019-10-28 NOTE — Telephone Encounter (Signed)
Call to patient, no answer, left a message to call clinic back.  I also left a message that I would send a my chart message.  My chart message has been sent.

## 2019-10-28 NOTE — Plan of Care (Signed)
  Problem: Education: Goal: Knowledge of risk factors and measures for prevention of condition will improve Outcome: Progressing   Problem: Coping: Goal: Psychosocial and spiritual needs will be supported Outcome: Progressing   Problem: Respiratory: Goal: Complications related to the disease process, condition or treatment will be avoided or minimized Outcome: Progressing

## 2019-10-28 NOTE — Progress Notes (Signed)
NAME:  [name], MRN:  [id number], DOB:  [birth year], LOS: 14 ADMISSION DATE:  07/22/2019, CONSULTATION DATE:  12/24 REFERRING MD:  Sloan Leiter, CHIEF COMPLAINT:  Dyspnea   Brief History   44 y/o male admitted 12/14 with COVID pneumonia leading to ARDS, pneumomediastinum.  Intubated 12/26, bilateral chest tubes placed.  Past Medical History  GERD HTN Asthma  Significant Hospital Events   12/14 admit with hypoxemic respiratory failure in setting of COVID-19 pneumonia 12/23 noted to have pneumomediastinum on chest x-ray 12/24PCCM consulted for evaluation 12/26 worsening hypoxemia, intubated 12/27 VV fem/fem ECMO cannulation 12/29 tracheostomy >> See bedside nursing event notes for full rundown of procedures after 12/27 1/5 epistaxis, nasal packing 1/30 improved cxr, improved lung compliance, TV 350s on 15PC  1/31 CXR increased infiltrates, +CFB     2/2 Converted to right IJ Crescent cannula. 2/3 PEG tube inserted at bedside 2/14 30 min sweep trial> paO2 105 (18 PEEP, 60% FiO2) pH 7.22, pCO2 87 2/20 2 hours sweep trial 2/21 1.5 hour sweep trial 2/24 7-hour sweep trial.  Increasingly awake. 3/2 - decannulation attempt aborted due to increased hypercapnia when sedated for procedure 3/4- dcannulated 3/8 PEG tube repositioned by IR 3/9 >> trach wean 3/14 started 24/7 TC  Consults:  PCCM, CTS, CHF, Palliative  Procedures:  PICC 1/3 >> R radial a-line 1/20 >> R IJ ECMO cath 2/3 >> Tracheostomy 12/29 >> 10/14/2019 PEG tube replacement>>  Significant Diagnostic Tests:  CT chest 12/22>>extensive pneumomediastinum, multifocal patchy bilateral groundglass opacities CXR 1/25 >> improving bilateral infiltrates. ECMO cannulae in place. CXR 2/5 >> clearing bilateral infiltrates  Micro Data:  BCx2 12/14 >>negative  Sputum 12/15 >> OPF 12/28 blood > negative 12/29 > staph aureus 1/4 bronch:  1/2 acid fast smear bronch: negative 1/2 fungal cx bronch: negative1/12 BAL:  serratia, staph epidermidis 1/4 BAL > MSSA, Serratia 1/9 blood > negative 1/9 Resp culture > MSSA, serratia 1/11 serratia > negative 1/12 fungus culture > negative 1/12 BAL > serratia 1/18 BAL >> rare Serratia. 1/24 blood > negative 1/30 blood > negative 1/30 resp culture > serratia 2/1 BAL >> rare Serratia - enterococcus 2/1 aspergillus Ag > negative 2/2 resp culture > serratia  2/2 blood > negative 2/5 urine > negative 2/9 resp: serratia 2/15 BAL serratia 3/5 blood cultures ngtd  Antimicrobials:  Received remdesivir, steroids, actemra and convalescent plasma  See MAR for remainder  Interim history/subjective:  Improving strength.   Objective   Blood pressure 135/89, pulse 94, temperature 98.4 F (36.9 C), temperature source Oral, resp. rate 19, height _0  (1.727 m), weight 120.8 kg, SpO2 98 %.    FiO2 (%):  [40 %-98 %] 40 %   Intake/Output Summary (Last 24 hours) at 10/28/2019 0943 Last data filed at 10/28/2019 0630 Gross per 24 hour  Intake 1525.98 ml  Output 900 ml  Net 625.98 ml   Filed Weights   10/26/19 0400 10/27/19 0500 10/28/19 0500  Weight: 121.5 kg 121 kg 120.8 kg    Examination: GEN: chronically ill appearing, obese man.  HEENT: MMM, trach in place, small mucoid secretions CV: RRR, ext warm PULM: Diminished bases, chest clear.  GI: PEG in place, soft, +BS EXT: global anasarca NEURO: Moves all 4 ext to command, profound weakness PSYCH: RASS 0  SKIN: Slightly pale, multiple pressure wounds, eschar on lateral feet stable  ABG    Component Value Date/Time   PHART 7.427 10/27/2019 1635   PCO2ART 57.5 (H) 10/27/2019 1635   PO2ART 48.0 (L)  10/27/2019 1635   HCO3 38.0 (H) 10/27/2019 1635   TCO2 40 (H) 10/27/2019 1635   ACIDBASEDEF 4.0 (H) 09/16/2019 2334   O2SAT 84.0 10/27/2019 1635     Resolved Hospital Problem list   Pneumothoraces-resolved.  Dislodged PEG tube Indurated area of the skin right lateral PEG tube.  Serratia pneumonia>  treated.   Assessment & Plan:   ARDS due to COVID 19 Decannulated from VV ECMO on 3/4 after 65 days. Now on TCT for nearly a week.  Tracheostomy tube in place Changed to 6 distal XLT 3/16 - Continue indefinite TC - PMV with SLP - Suctioning PRN - Still NPO per SLP post FEES  AKI with azotemia - improving  Positive cumulative fluid balance Hypernatremia has corrected Severe third spacing Chronic metabolic alkalosis.  -diuresis per CHF team  Hypertension - stable at this time   Neuromuscular weakness - OOB to chair as much as tolerated  - continued aggressive PT - Evaluation for CIR  Bilateral lower feet deep tissue injury -Wound care continued for feet -Will need orthotics to allow mobilization.   Anemia due to critical illnes Conservative transfusion threshold  Insomnia- on qHS seroquel, melatonin, trazodone, PRN ativan   TF-diarrhea- RD to be consulted to change schedule to minimize night-time awakenings and potentially change formula?  Best practice:  Pain/Anxiety/Delirium protocol (if indicated): - Dilaudid 46m q6h PRN - Precedex- clonidine taper - Valium 2.572mBID PRN - Citalopram 1091maily - Trazodone 100m39mS - PRN ativan qHS - Seroquel 50mg64m (increased today) - Melatonin started today  VAP protocol (if indicated): Bundle in place. Respiratory support goals: Trach collar trial DVT prophylaxis: Lovenox GI prophylaxis: PPI Nutrition: TF Fluid status goals:  See above Urinary catheter: external urinary catheter Glucose control: doing well with SSI alone Mobility/therapy needs: up with PT.  Daily labs: As needed Code Status: Full code  Disposition: to be determined Family: updated when at bedside  Labs   CBC: CBC Latest Ref Rng & Units 10/28/2019 10/27/2019 10/27/2019  WBC 4.0 - 10.5 K/uL 6.3 - 7.4  Hemoglobin 13.0 - 17.0 g/dL 10.9(L) 12.9(L) 11.2(L)  Hematocrit 39.0 - 52.0 % 37.4(L) 38.0(L) 38.8(L)  Platelets 150 - 400 K/uL 186 - 189      Basic Metabolic Panel: BMP Latest Ref Rng & Units 10/28/2019 10/27/2019 10/27/2019  Glucose 70 - 99 mg/dL 94 - 120(H)  BUN 6 - 20 mg/dL 39(H) - 39(H)  Creatinine 0.61 - 1.24 mg/dL 0.51(L) - 0.51(L)  Sodium 135 - 145 mmol/L 145 146(H) 145  Potassium 3.5 - 5.1 mmol/L 3.8 4.1 3.9  Chloride 98 - 111 mmol/L 101 - 103  CO2 22 - 32 mmol/L 33(H) - 34(H)  Calcium 8.9 - 10.3 mg/dL 9.3 - 9.3     GFR: Estimated Creatinine Clearance: 147.4 mL/min (A) (by C-G formula based on SCr of 0.51 mg/dL (L)). Recent Labs  Lab 10/25/19 0354 10/26/19 0450 10/27/19 0444 10/28/19 0404  WBC 6.3 7.7 7.4 6.3    Liver Function Tests: No results for input(s): AST, ALT, ALKPHOS, BILITOT, PROT, ALBUMIN in the last 168 hours. No results for input(s): LIPASE, AMYLASE in the last 168 hours. No results for input(s): AMMONIA in the last 168 hours.  ABG    Component Value Date/Time   PHART 7.427 10/27/2019 1635   PCO2ART 57.5 (H) 10/27/2019 1635   PO2ART 48.0 (L) 10/27/2019 1635   HCO3 38.0 (H) 10/27/2019 1635   TCO2 40 (H) 10/27/2019 1635   ACIDBASEDEF 4.0 (H) 09/16/2019 2334  O2SAT 84.0 10/27/2019 1635     Coagulation Profile: No results for input(s): INR, PROTIME in the last 168 hours.  Cardiac Enzymes: No results for input(s): CKTOTAL, CKMB, CKMBINDEX, TROPONINI in the last 168 hours.  HbA1C: Hgb A1c MFr Bld  Date/Time Value Ref Range Status  09/23/2019 08:58 AM 5.5 4.8 - 5.6 % Final    Comment:    (NOTE) Pre diabetes:          5.7%-6.4% Diabetes:              >6.4% Glycemic control for   <7.0% adults with diabetes   07/24/2019 04:50 AM 6.7 (H) 4.8 - 5.6 % Final    Comment:    (NOTE) Pre diabetes:          5.7%-6.4% Diabetes:              >6.4% Glycemic control for   <7.0% adults with diabetes     CBG: Recent Labs  Lab 10/27/19 1610 10/27/19 2023 10/27/19 2350 10/28/19 0359 10/28/19 0802  GLUCAP 96 111* 109* 90 [place], MD [facility] 10/28/2019 9:43 AM

## 2019-10-29 LAB — GLUCOSE, CAPILLARY
Glucose-Capillary: 124 mg/dL — ABNORMAL HIGH (ref 70–99)
Glucose-Capillary: 71 mg/dL (ref 70–99)
Glucose-Capillary: 123 mg/dL — ABNORMAL HIGH (ref 70–99)
Glucose-Capillary: 112 mg/dL — ABNORMAL HIGH (ref 70–99)
Glucose-Capillary: 106 mg/dL — ABNORMAL HIGH (ref 70–99)
Glucose-Capillary: 114 mg/dL — ABNORMAL HIGH (ref 70–99)

## 2019-10-29 MED ORDER — INSULIN ASPART 100 UNIT/ML ~~LOC~~ SOLN
0.0000 [IU] | Freq: Four times a day (QID) | SUBCUTANEOUS | Status: DC
  Administered 2019-10-30 – 2019-10-31 (×3): 2 [IU] via SUBCUTANEOUS
  Administered 2019-11-01: 3 [IU] via SUBCUTANEOUS
  Administered 2019-11-02 – 2019-11-07 (×13): 2 [IU] via SUBCUTANEOUS
  Administered 2019-11-08: 01:00:00 3 [IU] via SUBCUTANEOUS
  Administered 2019-11-08 – 2019-11-14 (×15): 2 [IU] via SUBCUTANEOUS

## 2019-10-29 MED ORDER — QUETIAPINE FUMARATE 25 MG PO TABS
25.0000 mg | ORAL_TABLET | Freq: Every day | ORAL | Status: DC
  Administered 2019-10-29 – 2019-10-31 (×3): 25 mg
  Filled 2019-10-29 (×3): qty 1

## 2019-10-29 NOTE — Progress Notes (Signed)
  Speech Language Pathology Treatment: Dysphagia  Patient Details Name: [name] MRN: [id number] DOB: Aug 12, 1975 Today's Date: 10/29/2019 Time: 6314-9702 SLP Time Calculation (min) (ACUTE ONLY): 11 min  Assessment / Plan / Recommendation Clinical Impression  Pt sitting in recliner with wife and mother present.  Trach capping trials began today - thus far tolerating well.  Voice quality is clear.  Reviewed basic results from last week's swallow study.  Today, pt consumed trials of ice chips with effortful swallow and head turns to left and right (may influence opening of UES for longer durations). He benefited from tactile cues at larynx and intermittent verbal cues to initiate a swallow response.  There were no overt s/s of aspiration during trials.  Pt will benefit from MBS end of this week or early next week to determine influence of UES and upper esophageal function upon overall swallow.  D/W pt/family.     HPI HPI: Pt is a 44 y/o male admitted 12/14 with COVID pneumonia leading to ARDS and needing mechanical ventilation, pneumomediastinum. He was intubated 12/26-12/29 with trach placement on latter date. PEG tube done on 09/11/19 and replaced on 10/14/19 after dislodging. Decannulated from VV ECMO on 3/4 after 65 days. SLP was consulted for possible PMSV placement. Pt downsized to #6 distal cuffless on 10/22/19. Capping trials 3/23. FEES 3/18 with pharyngoesophageal dysphagia noted and backflow of POs from esophagus aspirated; recs were to remain NPO.       SLP Plan  Continue with current plan of care;New goals to be determined pending instrumental study       Recommendations  Diet recommendations: NPO                Oral Care Recommendations: Oral care QID Follow up Recommendations: Inpatient Rehab SLP Visit Diagnosis: Dysphagia, pharyngoesophageal phase (R13.14) Plan: Continue with current plan of care;New goals to be determined pending instrumental study       GO               [name], [place] Office number [phone] Pager [phone]   [name] 10/29/2019, 2:22 PM

## 2019-10-29 NOTE — Progress Notes (Signed)
Physical Therapy Treatment Patient Details Name: [name] MRN: [id number] DOB: Jul 16, 1976 Today's Date: 10/29/2019    History of Present Illness 44 y/o M admitted 12/14 with COVID pneumonia causing acute hypoxemic respiratory failure.   Developed pneumomediastinum 12/23 with concern for possible bilateral small pneumothoraces on 12/24. Pt intubated on 12/26 and started on ECMO on 12/27. Tracheostomy on 12/29 with bleeding from trach an dother sites on 12/31. Bronch on 1/11 and 1/15. Slowly weaning sedation 2/5. Continuing to wean sedation and starting sweep trials to wean ECMO - Decannulated 3/4    PT Comments    Pt continues to make slow, steady progress.    Follow Up Recommendations  CIR;Supervision/Assistance - 24 hour     Equipment Recommendations  Other (comment)(To be determined)    Recommendations for Other Services       Precautions / Restrictions Precautions Precautions: Fall;Other (comment) Precaution Comments: PEG, trach collar, bilat foot wounds Required Braces or Orthoses: Other Brace    Mobility  Bed Mobility Overal bed mobility: Needs Assistance Bed Mobility: Rolling Rolling: Max assist         General bed mobility comments: Assist to bring hips and shoulder over but pt able to assist  Transfers Overall transfer level: Needs assistance                  Ambulation/Gait                 Stairs             Wheelchair Mobility    Modified Rankin (Stroke Patients Only)       Balance Overall balance assessment: Needs assistance Sitting-balance support: Feet supported Sitting balance-Leahy Scale: Poor Sitting balance - Comments: With pt in recliner brought trunk forward from backrest with mod to max assist. Able to maintain trunk forward and upright with min guard for 3-4 minutes.   Postural control: Posterior lean                                  Cognition Arousal/Alertness: Awake/alert Behavior During  Therapy: WFL for tasks assessed/performed Overall Cognitive Status: Within Functional Limits for tasks assessed                       Memory: Decreased short-term memory                Exercises General Exercises - Lower Extremity Long Arc Quad: AAROM;Both;10 reps;Seated Other Exercises Other Exercises: Worked on repeated bringing trunk forward from backrest of chair.     General Comments General comments (skin integrity, edema, etc.): Pt on trach collar with PMV. SpO2 >86%      Pertinent Vitals/Pain Pain Assessment: Faces Faces Pain Scale: Hurts even more Pain Location: rt knee with flexion Pain Descriptors / Indicators: Grimacing;Sore Pain Intervention(s): Limited activity within patient's tolerance;Monitored during session;Repositioned    Home Living                      Prior Function            PT Goals (current goals can now be found in the care plan section) Additional Goals Additional Goal #1: (not applicable) Additional Goal #2: Pt will tolerate tilt bed at 55 degrees for 5 minutes Progress towards PT goals: Progressing toward goals    Frequency    Min 3X/week      PT Plan  Current plan remains appropriate    Co-evaluation              AM-PAC PT "6 Clicks" Mobility   Outcome Measure  Help needed turning from your back to your side while in a flat bed without using bedrails?: Total Help needed moving from lying on your back to sitting on the side of a flat bed without using bedrails?: Total Help needed moving to and from a bed to a chair (including a wheelchair)?: Total Help needed standing up from a chair using your arms (e.g., wheelchair or bedside chair)?: Total Help needed to walk in hospital room?: Total Help needed climbing 3-5 steps with a railing? : Total 6 Click Score: 6    End of Session Equipment Utilized During Treatment: Oxygen Activity Tolerance: Patient tolerated treatment well Patient left: with call  bell/phone within reach;in chair Nurse Communication: Mobility status PT Visit Diagnosis: Muscle weakness (generalized) (M62.81);Other symptoms and signs involving the nervous system (R29.898);Other abnormalities of gait and mobility (R26.89)     Time: 0677-0340 PT Time Calculation (min) (ACUTE ONLY): 48 min  Charges:  $Therapeutic Activity: 38-52 mins                     [place] Pager [phone] Office [place] 10/29/2019, 2:04 PM

## 2019-10-29 NOTE — Progress Notes (Signed)
Patient ID: [name], male   DOB: 1975-10-23, 44 y.o.   MRN: [id number]    Advanced Heart Failure Rounding Note   Subjective:    Stable on TC. Talking through [employer].  Seen by CIR again yesterday and still felt not to be ready.   Objective:   Weight Range:  Vital Signs:   Temp:  [98.2 F (36.8 C)-98.5 F (36.9 C)] 98.5 F (36.9 C) (03/23 0801) Pulse Rate:  [71-103] 92 (03/23 1103) Resp:  [16-24] 24 (03/23 1103) BP: (113-177)/(52-96) 145/72 (03/23 1000) SpO2:  [94 %-100 %] 95 % (03/23 1103) FiO2 (%):  [35 %-60 %] 40 % (03/23 0746) Weight:  [126.4 kg] 126.4 kg (03/23 0600) Last BM Date: 10/28/19  Weight change: Filed Weights   10/27/19 0500 10/28/19 0500 10/29/19 0600  Weight: 121 kg 120.8 kg 126.4 kg    Intake/Output:   Intake/Output Summary (Last 24 hours) at 10/29/2019 1132 Last data filed at 10/29/2019 0400 Gross per 24 hour  Intake 1300 ml  Output 1600 ml  Net -300 ml     PHYSICAL EXAM: General:  Sitting up in bed on TC No resp difficulty HEENT: normal Neck: supple. no JVD. + trach Carotids 2+ bilat; no bruits. No lymphadenopathy or thryomegaly appreciated. Cor: PMI nondisplaced. Regular rate & rhythm. No rubs, gallops or murmurs. Lungs: clear Abdomen: obese soft, nontender, nondistended. No hepatosplenomegaly. No bruits or masses. Good bowel sounds. + PEG Extremities: no cyanosis, clubbing, rash, edema + eschar on feet Neuro: alert & orientedx3, cranial nerves grossly intact. Weak extremities     Telemetry: SR  80-90s Personally reviewed    Labs: Basic Metabolic Panel: Recent Labs  Lab 10/24/19 0327 10/24/19 0327 10/25/19 0354 10/25/19 0354 10/26/19 0450 10/27/19 0444 10/27/19 1635 10/28/19 0404  NA 144   < > 144  --  144 145 146* 145  K 4.3   < > 4.1  --  4.0 3.9 4.1 3.8  CL 105  --  106  --  103 103  --  101  CO2 29  --  30  --  32 34*  --  33*  GLUCOSE 92  --  98  --  129* 120*  --  94  BUN 40*  --  43*  --  39* 39*  --  39*   CREATININE 0.61  --  0.55*  --  0.39* 0.51*  --  0.51*  CALCIUM 9.0   < > 9.0   < > 9.6 9.3  --  9.3  MG 2.1  --  2.0  --  1.8 1.7  --  1.9  PHOS 4.7*  --  4.3  --  4.6 4.1  --  4.4   < > = values in this interval not displayed.    Liver Function Tests: No results for input(s): AST, ALT, ALKPHOS, BILITOT, PROT, ALBUMIN in the last 168 hours. No results for input(s): LIPASE, AMYLASE in the last 168 hours. No results for input(s): AMMONIA in the last 168 hours.  CBC: Recent Labs  Lab 10/24/19 0327 10/24/19 0327 10/25/19 0354 10/26/19 0450 10/27/19 0444 10/27/19 1635 10/28/19 0404  WBC 7.8  --  6.3 7.7 7.4  --  6.3  HGB 10.5*   < > 10.4* 11.0* 11.2* 12.9* 10.9*  HCT 36.1*   < > 35.5* 37.8* 38.8* 38.0* 37.4*  MCV 94.5  --  95.7 96.4 96.8  --  95.2  PLT 236  --  213 214 189  --  186   < > = values in this interval not displayed.    Cardiac Enzymes: No results for input(s): CKTOTAL, CKMB, CKMBINDEX, TROPONINI in the last 168 hours.  BNP: BNP (last 3 results) Recent Labs    07/22/19 1039  BNP 15.3    ProBNP (last 3 results) No results for input(s): PROBNP in the last 8760 hours.    Other results:  Imaging: No results found.   Medications:     Scheduled Medications: . amLODipine  10 mg Per Tube Daily  . vitamin C  500 mg Per Tube Daily  . carvedilol  12.5 mg Per Tube BID WC  . chlorhexidine  15 mL Mouth Rinse BID  . Chlorhexidine Gluconate Cloth  6 each Topical Daily  . clonazepam  1 mg Per Tube QHS  . cloNIDine  0.1 mg Oral Daily  . enoxaparin (LOVENOX) injection  60 mg Subcutaneous Q24H  . escitalopram  10 mg Per Tube Daily  . feeding supplement (PRO-STAT SUGAR FREE 64)  60 mL Per Tube TID  . free water  150 mL Per Tube Q6H  . Gerhardt's butt cream   Topical Daily  . insulin aspart  0-15 Units Subcutaneous Q6H  . ipratropium-albuterol  3 mL Nebulization TID  . mouth rinse  15 mL Mouth Rinse q12n4p  . melatonin  6 mg Per Tube QHS  . montelukast  10  mg Per Tube QHS  . nutrition supplement (JUVEN)  1 packet Per Tube BID BM  . pantoprazole sodium  40 mg Per Tube BID  . polycarbophil  625 mg Oral Daily  . psyllium  1 packet Oral TID  . QUEtiapine  50 mg Per Tube QHS  . sodium chloride flush  10-40 mL Intracatheter Q12H  . spironolactone  25 mg Oral Daily  . traZODone  100 mg Per Tube QHS    Infusions: . sodium chloride Stopped (10/13/19 0548)  . feeding supplement (OSMOLITE 1.5 CAL) 1,000 mL (10/29/19 0304)    PRN Medications: sodium chloride, acetaminophen, albuterol, calcium carbonate, [COMPLETED] diazepam **FOLLOWED BY** diazepam, diphenoxylate-atropine, diphenoxylate-atropine, hydrALAZINE, HYDROmorphone, ondansetron (ZOFRAN) IV, sodium chloride, sodium chloride flush   Assessment/Plan:    1. Acute hypoxic/hypercarbic respiratory failure due to COVID PNA: Remained markedly hypoxic despite full support. Intubated 12/26. S/p bilateral CTs 12/26 for pneumothoraces.  TEE on 12/26 LVEF 70% RV ok. VV ECMO begun 12/26. Tracheostomy 12/29.  COVID ARDS at this point. ECMO circuit changed 1/8 & 1/23. Crescent cannula placed 2/2 (femoral cannuals removed) - Decannulated 3/4 - On trach collar stable. hopefully can work toward decannulation. CCM continues to follow.  - o change today  2. COVID PNA: management of COVID infection per CCM. CXR with bilateral multifocal PNA progressing to likely ARDS.  - s/p 10 days of remdesivir - 12/16, 12/2 s/p tociluzimab - 12/17 s/p convalescent plasma - Decadron completed 1/11  3. ID: Empiric coverage with vancomycin/cefepime initially for possible secondary bacterial PNA, course completed.   - remains afebrile - will change PICC to midline  4. Anemia: - hgb 10.9 - stable. Now checking labs qod  5. FEN - Remains on TFs. Loose stools improved with change in regimen - Still NPO pending FEES  5. Hypernatremia -Na stable 145  -Continue free water.   6. AKI - Resolved.    7. DTIs - On both  feet.  Improving  8.  Hypokalemia. - follow qod  9.  Diffuse muscle weakness. -This is consistent with ICU/steroid/paralytics myopathy.  Continue passive range of  motion exercises with PT.   -PT and OT are following.  Both have recommended CIR.  CIR has seen again yesterday and feel he is not ready yet.  -D/w PT/OT at length will continue aggressive care. Have provided exercise for bedside RNs to help patient    10. HTN - Blood pressure well controlled. Continue current regimen.   11. Hypomag - supp as needed  Length of Stay: 57  Diontae Route MD 10/29/2019, 11:32 AM  Advanced Heart Failure Team Pager [phone] (M-F; 7a - 4p)  Please contact [facility] for night-coverage after hours (4p -7a ) and weekends on [email or web address]

## 2019-10-29 NOTE — Progress Notes (Signed)
[facility]  CONSULT SERVICE NOTE   I saw the patient yesterday and caught the end of his session with PT/OT. I still believe this patient has potential for inpatient rehab. However, he demonstrates profound (prox>distal) weakness and will need to demonstrate increased activity tolerance to participate in at least 2 hours of therapy per day. He appears positive and motivated. For now continue to push with acute-side PT/OT to maximize strength, bed mobility, OOB to chair. We will continue to follow along.    Thanks  Meredith Staggers, MD, [facility] 10/29/2019

## 2019-10-29 NOTE — Progress Notes (Signed)
Patient ID: [name], male   DOB: 03-21-1976, 44 y.o.   MRN: [id number] TCTS Evening Rounds  Hemodynamically stable in sinus rhythm.  sats 100% on [place]. Lurline Idol is out.  NPO per ST pending MBS. Tube feeds per PEG continue.  OOB with hoist. Remains very weak.

## 2019-10-29 NOTE — Progress Notes (Signed)
NAME:  [name], MRN:  [id number], DOB:  [birth year], LOS: 88 ADMISSION DATE:  07/22/2019, CONSULTATION DATE:  12/24 REFERRING MD:  Sloan Leiter, CHIEF COMPLAINT:  Dyspnea   Brief History   44 y/o male admitted 12/14 with COVID pneumonia leading to ARDS, pneumomediastinum.  Intubated 12/26, bilateral chest tubes placed.  Past Medical History  GERD HTN Asthma  Significant Hospital Events   12/14 admit with hypoxemic respiratory failure in setting of COVID-19 pneumonia 12/23 noted to have pneumomediastinum on chest x-ray 12/24PCCM consulted for evaluation 12/26 worsening hypoxemia, intubated 12/27 VV fem/fem ECMO cannulation 12/29 tracheostomy >> See bedside nursing event notes for full rundown of procedures after 12/27 1/5 epistaxis, nasal packing 1/30 improved cxr, improved lung compliance, TV 350s on 15PC  1/31 CXR increased infiltrates, +CFB     2/2 Converted to right IJ Crescent cannula. 2/3 PEG tube inserted at bedside 2/14 30 min sweep trial> paO2 105 (18 PEEP, 60% FiO2) pH 7.22, pCO2 87 2/20 2 hours sweep trial 2/21 1.5 hour sweep trial 2/24 7-hour sweep trial.  Increasingly awake. 3/2 - decannulation attempt aborted due to increased hypercapnia when sedated for procedure 3/4- decannulated 3/8 PEG tube repositioned by IR 3/9 > trach wean 3/14 started 24/7 TC  Consults:  PCCM, CTS, CHF, Palliative  Procedures:  PICC 1/3 >> R radial a-line 1/20 > 3/5 R IJ ECMO cath 2/3 > 3/4  Tracheostomy 12/29 >> 10/14/2019 PEG tube replacement>>  Significant Diagnostic Tests:  CT chest 12/22>extensive pneumomediastinum, multifocal patchy bilateral groundglass opacities CXR 1/25 > improving bilateral infiltrates. ECMO cannulae in place. CXR 2/5 > clearing bilateral infiltrates  Micro Data:  BCx2 12/14 >negative  Sputum 12/15 > OPF 12/28 blood > negative 12/29 > staph aureus 1/4 bronch:  1/2 acid fast smear bronch: negative 1/2 fungal cx bronch: negative1/12 BAL:  serratia, staph epidermidis 1/4 BAL > MSSA, Serratia 1/9 blood > negative 1/9 Resp culture > MSSA, serratia 1/11 serratia > negative 1/12 fungus culture > negative 1/12 BAL > serratia 1/18 BAL >> rare Serratia. 1/24 blood > negative 1/30 blood > negative 1/30 resp culture > serratia 2/1 BAL > rare Serratia - enterococcus 2/1 aspergillus Ag > negative 2/2 resp culture > serratia  2/2 blood > negative 2/5 urine > negative 2/9 resp: serratia 2/15 BAL serratia 3/5 blood cultures ngtd  Antimicrobials:  Received remdesivir, steroids, actemra and convalescent plasma  See MAR for remainder  Interim history/subjective:  Continues working with PT/OT. Continues TC.   Objective   Blood pressure (!) 177/96, pulse 100, temperature 98.5 F (36.9 C), resp. rate (!) 21, height _0  (1.727 m), weight 126.4 kg, SpO2 98 %.    FiO2 (%):  [35 %-60 %] 40 %   Intake/Output Summary (Last 24 hours) at 10/29/2019 0919 Last data filed at 10/29/2019 0400 Gross per 24 hour  Intake 1780 ml  Output 1601 ml  Net 179 ml   Filed Weights   10/27/19 0500 10/28/19 0500 10/29/19 0600  Weight: 121 kg 120.8 kg 126.4 kg    Examination: GEN: chronically ill appearing, obese man.  HEENT: MMM, trach in place >> clean, dry, intact  CV: RRR, ext warm PULM: Diminished bases, no crackles/wheeze  GI: PEG in place, soft, non-tender, peg in place  EXT: global anasarca NEURO: drowsy, follows commands, global weakness   SKIN: Slightly pale, multiple pressure wounds, eschar on lateral feet stable  ABG    Component Value Date/Time   PHART 7.427 10/27/2019 1635   PCO2ART 57.5 (H)  10/27/2019 1635   PO2ART 48.0 (L) 10/27/2019 1635   HCO3 38.0 (H) 10/27/2019 1635   TCO2 40 (H) 10/27/2019 1635   ACIDBASEDEF 4.0 (H) 09/16/2019 2334   O2SAT 84.0 10/27/2019 1635     Resolved Hospital Problem list   Pneumothoraces-resolved. Dislodged PEG tube Indurated area of the skin right lateral PEG tube. Serratia  pneumonia> treated.  Hypernatremia  AKI with azotemia  Positive cumulative fluid balance Severe third spacing  Assessment & Plan:   ARDS due to COVID 19 Decannulated from VV ECMO on 3/4 after 65 days S/P Tracheostomy 12/29 -3/14 Has remained off Vent  -3/16 Changed to 6 distal XLT  Plan - Continues on TC >> Can Likely begin Capping Trails  - Routine Trach Care  - PMV with SLP - Suctioning PRN - Still NPO per SLP post FEES  Hypertension Plan - Continue Norvasc, Coreg - PRN Hydralazine    Neuromuscular weakness Plan - OOB to chair as much as tolerated  - continued aggressive PT - Awaiting Evaluation for CIR  Bilateral lower feet deep tissue injury Plan -Wound care continued for feet -Will need orthotics to allow mobilization.   Anemia due to critical illness Plan -Trend CBC q48h - Hemoglobin 3/22 Stable at 10.9  -Continue Lovenox for DVT ppx   Insomnia Plan -on qHS seroquel, melatonin, trazodone, PRN ativan   TF-diarrhea > Improved  Plan - PRN Lomotil   Best practice:  Pain/Anxiety/Delirium protocol (if indicated): - Citalopram 13m daily - Trazodone 1055mqHS - Seroquel 5062mHS - Melatonin - PRN ativan qHS - Dilaudid 2mg41mh PRN - Valium 2.5mg 51m PRN  VAP protocol (if indicated): Bundle in place. Respiratory support goals: Trach collar trial DVT prophylaxis: Lovenox GI prophylaxis: PPI Nutrition: TF Urinary catheter: external urinary catheter Glucose control: SSI  Mobility/therapy needs: up with PT.  Daily labs: As needed Code Status: Full code  Disposition: to be determined > Possible CIR Family: updated when at bedside  Labs   CBC: CBC Latest Ref Rng & Units 10/28/2019 10/27/2019 10/27/2019  WBC 4.0 - 10.5 K/uL 6.3 - 7.4  Hemoglobin 13.0 - 17.0 g/dL 10.9(L) 12.9(L) 11.2(L)  Hematocrit 39.0 - 52.0 % 37.4(L) 38.0(L) 38.8(L)  Platelets 150 - 400 K/uL 186 - 189     Basic Metabolic Panel: BMP Latest Ref Rng & Units 10/28/2019 10/27/2019  10/27/2019  Glucose 70 - 99 mg/dL 94 - 120(H)  BUN 6 - 20 mg/dL 39(H) - 39(H)  Creatinine 0.61 - 1.24 mg/dL 0.51(L) - 0.51(L)  Sodium 135 - 145 mmol/L 145 146(H) 145  Potassium 3.5 - 5.1 mmol/L 3.8 4.1 3.9  Chloride 98 - 111 mmol/L 101 - 103  CO2 22 - 32 mmol/L 33(H) - 34(H)  Calcium 8.9 - 10.3 mg/dL 9.3 - 9.3     GFR: Estimated Creatinine Clearance: 151.1 mL/min (A) (by C-G formula based on SCr of 0.51 mg/dL (L)). Recent Labs  Lab 10/25/19 0354 10/26/19 0450 10/27/19 0444 10/28/19 0404  WBC 6.3 7.7 7.4 6.3    Liver Function Tests: No results for input(s): AST, ALT, ALKPHOS, BILITOT, PROT, ALBUMIN in the last 168 hours. No results for input(s): LIPASE, AMYLASE in the last 168 hours. No results for input(s): AMMONIA in the last 168 hours.  ABG    Component Value Date/Time   PHART 7.427 10/27/2019 1635   PCO2ART 57.5 (H) 10/27/2019 1635   PO2ART 48.0 (L) 10/27/2019 1635   HCO3 38.0 (H) 10/27/2019 1635   TCO2 40 (H) 10/27/2019 1635  ACIDBASEDEF 4.0 (H) 09/16/2019 2334   O2SAT 84.0 10/27/2019 1635     Coagulation Profile: No results for input(s): INR, PROTIME in the last 168 hours.  Cardiac Enzymes: No results for input(s): CKTOTAL, CKMB, CKMBINDEX, TROPONINI in the last 168 hours.  HbA1C: Hgb A1c MFr Bld  Date/Time Value Ref Range Status  09/23/2019 08:58 AM 5.5 4.8 - 5.6 % Final    Comment:    (NOTE) Pre diabetes:          5.7%-6.4% Diabetes:              >6.4% Glycemic control for   <7.0% adults with diabetes   07/24/2019 04:50 AM 6.7 (H) 4.8 - 5.6 % Final    Comment:    (NOTE) Pre diabetes:          5.7%-6.4% Diabetes:              >6.4% Glycemic control for   <7.0% adults with diabetes     CBG: Recent Labs  Lab 10/28/19 1520 10/28/19 2020 10/28/19 2320 10/29/19 0310 10/29/19 0800  GLUCAP 114* 98 124* 71 123*    Hayden Pedro, AGACNP-BC [facility]  Pgr: [phone]  PCCM Pgr: [phone]

## 2019-10-30 LAB — GLUCOSE, CAPILLARY
Glucose-Capillary: 107 mg/dL — ABNORMAL HIGH (ref 70–99)
Glucose-Capillary: 114 mg/dL — ABNORMAL HIGH (ref 70–99)
Glucose-Capillary: 136 mg/dL — ABNORMAL HIGH (ref 70–99)
Glucose-Capillary: 141 mg/dL — ABNORMAL HIGH (ref 70–99)

## 2019-10-30 LAB — CBC
HCT: 39.8 % (ref 39.0–52.0)
Hemoglobin: 11.8 g/dL — ABNORMAL LOW (ref 13.0–17.0)
MCH: 27.7 pg (ref 26.0–34.0)
MCHC: 29.6 g/dL — ABNORMAL LOW (ref 30.0–36.0)
MCV: 93.4 fL (ref 80.0–100.0)
Platelets: 159 10*3/uL (ref 150–400)
RBC: 4.26 MIL/uL (ref 4.22–5.81)
RDW: 17 % — ABNORMAL HIGH (ref 11.5–15.5)
WBC: 7.7 10*3/uL (ref 4.0–10.5)
nRBC: 0 % (ref 0.0–0.2)

## 2019-10-30 LAB — BASIC METABOLIC PANEL
Anion gap: 10 (ref 5–15)
BUN: 35 mg/dL — ABNORMAL HIGH (ref 6–20)
CO2: 34 mmol/L — ABNORMAL HIGH (ref 22–32)
Calcium: 9.7 mg/dL (ref 8.9–10.3)
Chloride: 101 mmol/L (ref 98–111)
Creatinine, Ser: 0.62 mg/dL (ref 0.61–1.24)
GFR calc Af Amer: >60 mL/min (ref >60)
GFR calc non Af Amer: >60 mL/min (ref >60)
Glucose, Bld: 141 mg/dL — ABNORMAL HIGH (ref 70–99)
Potassium: 3.9 mmol/L (ref 3.5–5.1)
Sodium: 145 mmol/L (ref 135–145)

## 2019-10-30 LAB — MAGNESIUM: Magnesium: 1.7 mg/dL (ref 1.7–2.4)

## 2019-10-30 LAB — PHOSPHORUS: Phosphorus: 4.8 mg/dL — ABNORMAL HIGH (ref 2.5–4.6)

## 2019-10-30 MED ORDER — MAGNESIUM SULFATE 2 GM/50ML IV SOLN
2.0000 g | Freq: Once | INTRAVENOUS | Status: AC
  Administered 2019-10-30: 2 g via INTRAVENOUS
  Filled 2019-10-30: qty 50

## 2019-10-30 MED ORDER — CARVEDILOL 25 MG PO TABS
25.0000 mg | ORAL_TABLET | Freq: Two times a day (BID) | ORAL | Status: DC
  Administered 2019-10-30 – 2019-11-11 (×24): 25 mg
  Filled 2019-10-30 (×24): qty 1

## 2019-10-30 NOTE — Progress Notes (Signed)
  Speech Language Pathology Treatment: Dysphagia  Patient Details Name: [name] MRN: [id number] DOB: Jan 25, 1976 Today's Date: 10/30/2019 Time: 1532-1600 SLP Time Calculation (min) (ACUTE ONLY): 28 min  Assessment / Plan / Recommendation Clinical Impression  Pt was seen for dysphagia treatment and was cooperative throughout the session. He initially reported fatigue from being in the seated position for over an hour and working with other therapies but was amenable to participation if the session was abbreviated/breaks were provided. Lurline Idol was capped throughout the session and vitals were within normal limits. Pt tolerated diced peaches and ice chips without overt s/sx of aspiration. He demonstrated effortful swallows with min cues. He completed the mendelsohn maneuver with min-mod cues and was able to maintain hyolaryngeal elevation for a max of 6 seconds. Additional exercises were deferred to allow pt to rest. SLP will continue to follow pt.    HPI HPI: Pt is a 44 y/o male admitted 12/14 with COVID pneumonia leading to ARDS and needing mechanical ventilation, pneumomediastinum. He was intubated 12/26-12/29 with trach placement on latter date. PEG tube done on 09/11/19 and replaced on 10/14/19 after dislodging. Decannulated from VV ECMO on 3/4 after 65 days. SLP was consulted for possible PMSV placement. Pt downsized to #6 distal cuffless on 10/22/19. Capping trials 3/23. FEES 3/19 with pharyngoesophageal dysphagia noted and backflow of POs from esophagus aspirated; recs were to remain NPO.       SLP Plan  Continue with current plan of care;New goals to be determined pending instrumental study       Recommendations  Diet recommendations: NPO Medication Administration: Via alternative means      Patient may use Passy-Muir Speech Valve: During all waking hours (remove during sleep)(However, pt's trach is now capped)         Oral Care Recommendations: Oral care QID Follow up  Recommendations: Inpatient Rehab SLP Visit Diagnosis: Dysphagia, pharyngoesophageal phase (R13.14) Plan: Continue with current plan of care;New goals to be determined pending instrumental study       [place] I. Hardin Negus, [place], [place] Office number [phone] Pager [place] 10/30/2019, 5:19 PM

## 2019-10-30 NOTE — Progress Notes (Signed)
Physical Therapy Treatment Patient Details Name: [name] MRN: [id number] DOB: [birth year] Today's Date: 10/30/2019    History of Present Illness 44 y/o M admitted 12/14 with COVID pneumonia causing acute hypoxemic respiratory failure.   Developed pneumomediastinum 12/23 with concern for possible bilateral small pneumothoraces on 12/24. Pt intubated on 12/26 and started on ECMO on 12/27. Tracheostomy on 12/29 with bleeding from trach an dother sites on 12/31. Bronch on 1/11 and 1/15. Slowly weaning sedation 2/5. Continuing to wean sedation and starting sweep trials to wean ECMO - Decannulated 3/4    PT Comments    Pt admitted with above diagnosis. Pt continues to progress well.  Pt very active today with exercise.  Smiling and enjoying session.  Improved muscle activation daily with incr trunk strength.  Pt currently with functional limitations due to balance and endurance deficits. Pt will benefit from skilled PT to increase their independence and safety with mobility to allow discharge to the venue listed below.     Follow Up Recommendations  CIR;Supervision/Assistance - 24 hour     Equipment Recommendations  Other (comment)(To be determined)    Recommendations for Other Services OT consult     Precautions / Restrictions Precautions Precautions: Fall;Other (comment) Precaution Comments: PEG, trach collar, bilat foot wounds Required Braces or Orthoses: Other Brace Other Brace: B prevalon boots Restrictions Weight Bearing Restrictions: No    Mobility  Bed Mobility               General bed mobility comments: Brought bed to chair position and worked on trunk control unsupported off of HOB.  Pt was able to initiate movement and needed min to mod assist for anterior pelvic tilt using blanket to stretch lower back as well.  Performed at least 4 x.  Pt able to squeeze shoulder blades together with cues. Did prop his right elbow on bedside table to right of pt and left UE  supported for some time on bed rail and then on lap.  Also performed trunk rotation wiht blanket. Left pt in full chair position in bed.  Played 80's music for pt and he wanted it left on.    Transfers                 General transfer comment: did not attempt  Ambulation/Gait             General Gait Details: did not attempt ambulation yet   Stairs             Wheelchair Mobility    Modified Rankin (Stroke Patients Only)       Balance Overall balance assessment: Needs assistance Sitting-balance support: Feet supported;Bilateral upper extremity supported Sitting balance-Leahy Scale: Poor Sitting balance - Comments: worked on sitting balance in bed in egress position                                    Cognition Arousal/Alertness: Awake/alert Behavior During Therapy: WFL for tasks assessed/performed Overall Cognitive Status: Within Functional Limits for tasks assessed Area of Impairment: Attention;Following commands;Problem solving                   Current Attention Level: Selective;Alternating Memory: Decreased short-term memory Following Commands: Follows one step commands consistently Safety/Judgement: Decreased awareness of safety;Decreased awareness of deficits Awareness: Emergent Problem Solving: Slow processing General Comments: Improved cognition; more interactive adn talking with therapists about music/80s, more animated, smiled  alot      Exercises General Exercises - Lower Extremity Ankle Circles/Pumps: PROM;Both;5 reps Quad Sets: AROM;Both;10 reps;Seated Long Arc Quad: AAROM;Both;5 reps;Supine Other Exercises Other Exercises: Worked on repeated bringing trunk forward from backrest of bed.    General Comments General comments (skin integrity, edema, etc.): VSS with trach collar with PMV in place. 36% trach collar      Pertinent Vitals/Pain Pain Assessment: Faces Faces Pain Scale: No hurt Pain Intervention(s):  Limited activity within patient's tolerance;Monitored during session;Repositioned    Home Living                      Prior Function            PT Goals (current goals can now be found in the care plan section) Acute Rehab PT Goals Patient Stated Goal: regain strength and independence PT Goal Formulation: With patient/family Time For Goal Achievement: 11/12/19 Potential to Achieve Goals: Good Progress towards PT goals: Progressing toward goals    Frequency    Min 3X/week      PT Plan Current plan remains appropriate    Co-evaluation PT/OT/SLP Co-Evaluation/Treatment: Yes Reason for Co-Treatment: Complexity of the patient's impairments (multi-system involvement);For patient/therapist safety PT goals addressed during session: Mobility/safety with mobility;Strengthening/ROM        AM-PAC PT "6 Clicks" Mobility   Outcome Measure  Help needed turning from your back to your side while in a flat bed without using bedrails?: Total Help needed moving from lying on your back to sitting on the side of a flat bed without using bedrails?: Total Help needed moving to and from a bed to a chair (including a wheelchair)?: Total Help needed standing up from a chair using your arms (e.g., wheelchair or bedside chair)?: Total Help needed to walk in hospital room?: Total Help needed climbing 3-5 steps with a railing? : Total 6 Click Score: 6    End of Session Equipment Utilized During Treatment: Oxygen Activity Tolerance: Patient tolerated treatment well Patient left: with call bell/phone within reach;in bed Nurse Communication: Mobility status PT Visit Diagnosis: Muscle weakness (generalized) (M62.81);Other symptoms and signs involving the nervous system (R29.898);Other abnormalities of gait and mobility (R26.89)     Time: 3329-5188 PT Time Calculation (min) (ACUTE ONLY): 44 min  Charges:  $Therapeutic Activity: 23-37 mins                     Brenetta Penny W,PT Acute  Rehabilitation Services Pager:  [phone]  Office:  [place] 10/30/2019, 1:04 PM

## 2019-10-30 NOTE — Progress Notes (Signed)
NAME:  [name], MRN:  [id number], DOB:  04/29/1976, LOS: 69 ADMISSION DATE:  07/22/2019, CONSULTATION DATE:  12/24 REFERRING MD:  Sloan Leiter, CHIEF COMPLAINT:  Dyspnea   Brief History   44 y/o male admitted 12/14 with COVID pneumonia leading to ARDS, pneumomediastinum.  Intubated 12/26, bilateral chest tubes placed.  Past Medical History  GERD HTN Asthma  Significant Hospital Events   12/14 admit with hypoxemic respiratory failure in setting of COVID-19 pneumonia 12/23 noted to have pneumomediastinum on chest x-ray 12/24PCCM consulted for evaluation 12/26 worsening hypoxemia, intubated 12/27 VV fem/fem ECMO cannulation 12/29 tracheostomy >> See bedside nursing event notes for full rundown of procedures after 12/27 1/5 epistaxis, nasal packing 1/30 improved cxr, improved lung compliance, TV 350s on 15PC  1/31 CXR increased infiltrates, +CFB     2/2 Converted to right IJ Crescent cannula. 2/3 PEG tube inserted at bedside 2/14 30 min sweep trial> paO2 105 (18 PEEP, 60% FiO2) pH 7.22, pCO2 87 2/20 2 hours sweep trial 2/21 1.5 hour sweep trial 2/24 7-hour sweep trial.  Increasingly awake. 3/2 - decannulation attempt aborted due to increased hypercapnia when sedated for procedure 3/4- decannulated 3/8 PEG tube repositioned by IR 3/9 > trach wean 3/14 started 24/7 TC 3/23 starting capping trials 3/24 changed to 5 prox xlt cuffless Consults:  PCCM, CTS, CHF, Palliative  Procedures:  PICC 1/3 >> R radial a-line 1/20 > 3/5 R IJ ECMO cath 2/3 > 3/4  Tracheostomy 12/29 >> 10/14/2019 PEG tube replacement>>  Significant Diagnostic Tests:  CT chest 12/22>extensive pneumomediastinum, multifocal patchy bilateral groundglass opacities CXR 1/25 > improving bilateral infiltrates. ECMO cannulae in place. CXR 2/5 > clearing bilateral infiltrates  Micro Data:  BCx2 12/14 >negative  Sputum 12/15 > OPF 12/28 blood > negative 12/29 > staph aureus 1/4 bronch:  1/2 acid fast  smear bronch: negative 1/2 fungal cx bronch: negative1/12 BAL: serratia, staph epidermidis 1/4 BAL > MSSA, Serratia 1/9 blood > negative 1/9 Resp culture > MSSA, serratia 1/11 serratia > negative 1/12 fungus culture > negative 1/12 BAL > serratia 1/18 BAL >> rare Serratia. 1/24 blood > negative 1/30 blood > negative 1/30 resp culture > serratia 2/1 BAL > rare Serratia - enterococcus 2/1 aspergillus Ag > negative 2/2 resp culture > serratia  2/2 blood > negative 2/5 urine > negative 2/9 resp: serratia 2/15 BAL serratia 3/5 blood cultures ngtd  Antimicrobials:  Received remdesivir, steroids, actemra and convalescent plasma  See MAR for remainder  Interim history/subjective:  No distress  Objective   Blood pressure 138/89, pulse (Abnormal) 107, temperature 98.7 F (37.1 C), temperature source Oral, resp. rate (Abnormal) 24, height _0  (1.727 m), weight 120.4 kg, SpO2 97 %.    FiO2 (%):  [36 %] 36 %   Intake/Output Summary (Last 24 hours) at 10/30/2019 1054 Last data filed at 10/30/2019 0700 Gross per 24 hour  Intake 5820 ml  Output 1600 ml  Net 4220 ml   Filed Weights   10/28/19 0500 10/29/19 0600 10/30/19 0500  Weight: 120.8 kg 126.4 kg 120.4 kg    Examination: General this is a 44 year old obese male he is resting in bed and in no acute distress. HEENT normocephalic atraumatic currently has a size 6 proximal XLT tracheostomy in place this is capped, his phonation is strong, his cough is strong. Pulmonary: Diminished bases no accessory use  Cardiac regular rate and rhythm Abdomen soft not tender Extremities are warm and dry with brisk capillary refill still has some dependent edema  Neuro awake oriented no focal deficits, diffusely weak barely able to lift against gravity with offers GU clear yellow    Resolved Hospital Problem list    ARDS due to COVID 19 Decannulated from VV ECMO on 3/4 after 65 days  Pneumothoraces-resolved. Dislodged PEG  tube Indurated area of the skin right lateral PEG tube. Serratia pneumonia> treated.  Hypernatremia  AKI with azotemia  Positive cumulative fluid balance Severe third spacing  Assessment & Plan:  Tracheostomy dependent since 12/29 due to prolonged critical illness after COVID-19 infection Hypertension Neuromuscular weakness Bilateral lower feet deep tissue injury Anemia due to critical illness Insomnia TF-diarrhea > Improved   Discussion At this point deconditioning remains his biggest barrier to progress.  He is barely able to lift antigravity with upper extremities.  His cough mechanics are strong, and he is tolerating capping trials well but he is yet to have had his swallow eval.  Airway standpoint he no longer needs tracheostomy however for airway clearance given his degree of deconditioning I like to see him a little bit stronger before we remove tracheostomy.  Plan We will change him to a size 5 proximal XLT tracheostomy this will be cuffless, this will give him a little bit more real estate in the airway, may also help with swallowing eval for ease of swallowing, also maintains a safety net for suctioning should he aspirate after swallowing evaluation Continue capping trials as tolerated, he must have ongoing continuous pulse oximetry with this Ultimately I believe he will be decannulated, I just like to give him a little more time to get stronger  Erick Colace ACNP-BC [place] Pager # [phone] OR # [phone] if no answer

## 2019-10-30 NOTE — Procedures (Signed)
Tracheostomy tube change: Informed verbal consent was obtained after explaining the risks (including bleeding and infection), benefits and alternatives of the procedure. Verbal timeout was performed prior to the procedure. The old  #6 cuffless trach was carefully removed. the tracheostomy site appeared: unremarkable. A new #  5 Cuffless trach was easily placed in the tracheostomy stoma and secured with velcro trach ties. The tracheostomy was patent, good color change observed via EZ-CAP, and the patient was easily able to voice with finger occlusion and tolerated the procedure well with no immediate complications.   Erick Colace ACNP-BC [place] Pager # [phone] OR # [phone] if no answer

## 2019-10-30 NOTE — Progress Notes (Addendum)
Occupational Therapy Treatment Patient Details Name: [name] MRN: [id number] DOB: 1975-08-13 Today's Date: 10/30/2019    History of present illness 44 y/o M admitted 12/14 with COVID pneumonia causing acute hypoxemic respiratory failure.   Developed pneumomediastinum 12/23 with concern for possible bilateral small pneumothoraces on 12/24. Pt intubated on 12/26 and started on ECMO on 12/27. Tracheostomy on 12/29 with bleeding from trach an dother sites on 12/31. Bronch on 1/11 and 1/15. Slowly weaning sedation 2/5. Continuing to wean sedation and starting sweep trials to wean ECMO - Decannulated 3/4   OT comments  Pt progressing toward stated goals. Focused session on truncal and BUE exercises. Placed pt in chair position and facilitated truncal extension with use of blanket around back. Pt challenged to hold truncal extension, was able to with min A from therapist without support from bed for 5-6 seconds. Pt also engaged in truncal rotation with mod A and Bil leans to facilitate BUE weightbearing. Pt continues to present with poor scapulohumeral rhythm. Provided assisted scapular elevation and depression with AAROM of shoulder. Pt more animated this date, asking about his condition and hospital course, smiling and enjoying music. Requires increased time to respond verbally. Donned R resting hand splint and reinforced education with Conservation officer, historic buildings. D/c recs remain appropriate. Will continue to follow.   Follow Up Recommendations  CIR;Supervision/Assistance - 24 hour    Equipment Recommendations  None recommended by OT    Recommendations for Other Services      Precautions / Restrictions Precautions Precautions: Fall;Other (comment) Precaution Comments: PEG, trach collar, bilat foot wounds Required Braces or Orthoses: Other Brace Other Brace: B prevalon boots Restrictions Weight Bearing Restrictions: No       Mobility Bed Mobility               General bed mobility comments: bed  placed in chair position with focus on trunk extensors and rotators. Placed towel behind back to use as cue to facilitate truncal extension. R elbow placed on bedside table for positioning. Focus to squeeze shoulder blades together, rotate spine, and WB through [place] transfer comment: did not attempt    Balance Overall balance assessment: Needs assistance Sitting-balance support: Feet supported;Bilateral upper extremity supported Sitting balance-Leahy Scale: Poor Sitting balance - Comments: worked on sitting balance in bed in egress position                                   ADL either performed or assessed with clinical judgement   ADL Overall ADL's : Needs assistance/impaired     Grooming: Maximal assistance;Wash/dry face;Oral care                                 General ADL Comments: focused session on truncal and BUE exercise in chair position to prepare for BADL engagement     Vision   Vision Assessment?: No apparent visual deficits   Perception     Praxis      Cognition Arousal/Alertness: Awake/alert Behavior During Therapy: WFL for tasks assessed/performed Overall Cognitive Status: Impaired/Different from baseline Area of Impairment: Problem solving;Following commands;Memory                   Current Attention Level: Selective;Alternating Memory: Decreased short-term memory Following Commands:  Follows one step commands consistently Safety/Judgement: Decreased awareness of safety;Decreased awareness of deficits Awareness: Emergent Problem Solving: Slow processing;Decreased initiation;Requires verbal cues;Requires tactile cues General Comments: improving awareness this date, asking about how long he has been in the hospital and when he was at [facility]. More motivated with music. Requires increased time to produce verbal responses        Exercises General Exercises - Lower  Extremity Ankle Circles/Pumps: PROM;Both;5 reps Quad Sets: AROM;Both;10 reps;Seated Long Arc Quad: AAROM;Both;5 reps;Supine Other Exercises Other Exercises: repeated truncal extension without back support of bed while in chair position; blanket used as cue to facilitate spinal extension, also used without to have pt hold as long as possible Other Exercises: repeated truncal rotation with back off of bed while in chair position Other Exercises: BUE flexion, extension with AAROM Other Exercises: Lateral leans for BUE weightbearing   Shoulder Instructions       General Comments VSS with trach collar with PMV in place. 36% trach collar    Pertinent Vitals/ Pain       Pain Assessment: No/denies pain Faces Pain Scale: No hurt Pain Intervention(s): Limited activity within patient's tolerance;Monitored during session;Repositioned  Home Living                                          Prior Functioning/Environment              Frequency  Min 3X/week        Progress Toward Goals  OT Goals(current goals can now be found in the care plan section)     Acute Rehab OT Goals Patient Stated Goal: regain strength and independence  Plan Discharge plan remains appropriate    Co-evaluation    PT/OT/SLP Co-Evaluation/Treatment: Yes Reason for Co-Treatment: Complexity of the patient's impairments (multi-system involvement);For patient/therapist safety PT goals addressed during session: Mobility/safety with mobility;Strengthening/ROM OT goals addressed during session: ADL's and self-care;Strengthening/ROM      AM-PAC OT "6 Clicks" Daily Activity     Outcome Measure   Help from another person eating meals?: Total Help from another person taking care of personal grooming?: A Lot Help from another person toileting, which includes using toliet, bedpan, or urinal?: Total Help from another person bathing (including washing, rinsing, drying)?: Total Help from another  person to put on and taking off regular upper body clothing?: Total Help from another person to put on and taking off regular lower body clothing?: Total 6 Click Score: 7    End of Session Equipment Utilized During Treatment: Oxygen  OT Visit Diagnosis: Muscle weakness (generalized) (M62.81);Other abnormalities of gait and mobility (R26.89);Other symptoms and signs involving cognitive function;Pain   Activity Tolerance Patient tolerated treatment well   Patient Left in bed;with call bell/phone within reach;with SCD's reapplied;with nursing/sitter in room;Other (comment)(chair position)   Nurse Communication Mobility status        Time: 5997-7414 OT Time Calculation (min): 45 min  Charges: OT Treatments $Therapeutic Exercise: 8-22 mins  Zenovia Jarred, MSOT, OTR/L Acute Rehabilitation Services [facility] Office Number: [phone] Pager: [phone]  Zenovia Jarred 10/30/2019, 1:51 PM

## 2019-10-30 NOTE — Progress Notes (Addendum)
Patient ID: [name], male   DOB: Mar 21, 1976, 44 y.o.   MRN: [id number]    Advanced Heart Failure Rounding Note   Subjective:    Did not sleep too well last night, but otherwise no complaints. No events overnight.   Resting comfortably in bed. Talking. O2 sats stable on [place].     Objective:   Weight Range:  Vital Signs:   Temp:  [97.8 F (36.6 C)-98.5 F (36.9 C)] 98.2 F (36.8 C) (03/24 0400) Pulse Rate:  [87-110] 107 (03/24 0727) Resp:  [15-26] 24 (03/24 0727) BP: (125-181)/(60-129) 138/89 (03/24 0727) SpO2:  [88 %-100 %] 97 % (03/24 0727) FiO2 (%):  [36 %-40 %] 36 % (03/24 0727) Weight:  [120.4 kg] 120.4 kg (03/24 0500) Last BM Date: 10/29/19  Weight change: Filed Weights   10/28/19 0500 10/29/19 0600 10/30/19 0500  Weight: 120.8 kg 126.4 kg 120.4 kg    Intake/Output:   Intake/Output Summary (Last 24 hours) at 10/30/2019 0741 Last data filed at 10/30/2019 0700 Gross per 24 hour  Intake 6030 ml  Output 2400 ml  Net 3630 ml     PHYSICAL EXAM: General:  Obese WM, laying in bed. No resp difficulty HEENT: normal anicteric  Neck: supple. no JVD. + trach Carotids 2+ bilat; no bruits. No lymphadenopathy or thryomegaly appreciated. Cor: regular tach. No murmurs  Lungs: clear no wheeze Abdomen: obese soft, nontender, nondistended. No hepatosplenomegaly. No bruits or masses. Good bowel sounds. + PEG Extremities: no cyanosis, clubbing, rash, no edema + eschar on feet Neuro: alert & orientedx3, cranial nerves grossly intact. Weak extremities    Telemetry: sinus tach 110s  Personally reviewed    Labs: Basic Metabolic Panel: Recent Labs  Lab 10/25/19 0354 10/25/19 0354 10/26/19 0450 10/26/19 0450 10/27/19 0444 10/27/19 1635 10/28/19 0404 10/30/19 0326  NA 144   < > 144  --  145 146* 145 145  K 4.1   < > 4.0  --  3.9 4.1 3.8 3.9  CL 106  --  103  --  103  --  101 101  CO2 30  --  32  --  34*  --  33* 34*  GLUCOSE 98  --  129*  --  120*  --  94 141*   BUN 43*  --  39*  --  39*  --  39* 35*  CREATININE 0.55*  --  0.39*  --  0.51*  --  0.51* 0.62  CALCIUM 9.0   < > 9.6   < > 9.3  --  9.3 9.7  MG 2.0  --  1.8  --  1.7  --  1.9 1.7  PHOS 4.3  --  4.6  --  4.1  --  4.4 4.8*   < > = values in this interval not displayed.    Liver Function Tests: No results for input(s): AST, ALT, ALKPHOS, BILITOT, PROT, ALBUMIN in the last 168 hours. No results for input(s): LIPASE, AMYLASE in the last 168 hours. No results for input(s): AMMONIA in the last 168 hours.  CBC: Recent Labs  Lab 10/25/19 0354 10/25/19 0354 10/26/19 0450 10/27/19 0444 10/27/19 1635 10/28/19 0404 10/30/19 0326  WBC 6.3  --  7.7 7.4  --  6.3 7.7  HGB 10.4*   < > 11.0* 11.2* 12.9* 10.9* 11.8*  HCT 35.5*   < > 37.8* 38.8* 38.0* 37.4* 39.8  MCV 95.7  --  96.4 96.8  --  95.2 93.4  PLT 213  --  214 189  --  186 159   < > = values in this interval not displayed.    Cardiac Enzymes: No results for input(s): CKTOTAL, CKMB, CKMBINDEX, TROPONINI in the last 168 hours.  BNP: BNP (last 3 results) Recent Labs    07/22/19 1039  BNP 15.3    ProBNP (last 3 results) No results for input(s): PROBNP in the last 8760 hours.    Other results:  Imaging: No results found.   Medications:     Scheduled Medications: . amLODipine  10 mg Per Tube Daily  . vitamin C  500 mg Per Tube Daily  . carvedilol  12.5 mg Per Tube BID WC  . chlorhexidine  15 mL Mouth Rinse BID  . Chlorhexidine Gluconate Cloth  6 each Topical Daily  . clonazepam  1 mg Per Tube QHS  . cloNIDine  0.1 mg Oral Daily  . enoxaparin (LOVENOX) injection  60 mg Subcutaneous Q24H  . escitalopram  10 mg Per Tube Daily  . feeding supplement (PRO-STAT SUGAR FREE 64)  60 mL Per Tube TID  . free water  150 mL Per Tube Q6H  . Gerhardt's butt cream   Topical Daily  . insulin aspart  0-15 Units Subcutaneous Q6H  . ipratropium-albuterol  3 mL Nebulization TID  . mouth rinse  15 mL Mouth Rinse q12n4p  . melatonin   6 mg Per Tube QHS  . montelukast  10 mg Per Tube QHS  . nutrition supplement (JUVEN)  1 packet Per Tube BID BM  . pantoprazole sodium  40 mg Per Tube BID  . polycarbophil  625 mg Oral Daily  . psyllium  1 packet Oral TID  . QUEtiapine  25 mg Per Tube QHS  . sodium chloride flush  10-40 mL Intracatheter Q12H  . spironolactone  25 mg Oral Daily  . traZODone  100 mg Per Tube QHS    Infusions: . sodium chloride Stopped (10/13/19 0548)  . feeding supplement (OSMOLITE 1.5 CAL) 1,000 mL (10/29/19 0304)    PRN Medications: sodium chloride, acetaminophen, albuterol, calcium carbonate, [COMPLETED] diazepam **FOLLOWED BY** diazepam, diphenoxylate-atropine, diphenoxylate-atropine, hydrALAZINE, HYDROmorphone, ondansetron (ZOFRAN) IV, sodium chloride, sodium chloride flush   Assessment/Plan:    1. Acute hypoxic/hypercarbic respiratory failure due to COVID PNA: Remained markedly hypoxic despite full support. Intubated 12/26. S/p bilateral CTs 12/26 for pneumothoraces.  TEE on 12/26 LVEF 70% RV ok. VV ECMO begun 12/26. Tracheostomy 12/29.  COVID ARDS at this point. ECMO circuit changed 1/8 & 1/23. Crescent cannula placed 2/2 (femoral cannuals removed) - Decannulated 3/4 - Still w/ trach collar but currently on [place]. O2 sats stable. Hopefully can work toward decannulation. CCM continues to follow.  - No change today  2. COVID PNA: management of COVID infection per CCM. CXR with bilateral multifocal PNA progressing to likely ARDS.  - s/p 10 days of remdesivir - 12/16, 12/2 s/p tociluzimab - 12/17 s/p convalescent plasma - Decadron completed 1/11  3. ID: Empiric coverage with vancomycin/cefepime initially for possible secondary bacterial PNA, course completed.   - remains afebrile - WBC ct normal   4. Anemia: - hgb stable  - stable. Now checking labs qod  5. FEN - Remains on TFs. Loose stools improved with change in regimen - Still NPO pending FEES  5. Hypernatremia -Na stable 145   -Continue free water.   6. AKI - Resolved.    7. DTIs - On both feet.  Improving  8.  Hypokalemia. - K 3.9 today. Follow labs qod  9.  Diffuse muscle weakness. -This is consistent with ICU/steroid/paralytics myopathy.  Continue passive range of motion exercises with PT.   -PT and OT are following.  Both have recommended CIR.  CIR following closley and feels he is not ready yet. Continue to push w/ acute-side PT/OT to maximize strength, bed mobility, OOB to chair.   10. HTN - BP mildly elevated and tachycardic. Increase Coreg to 25 mg bid today.   11. Hypomag - 1.7 today. Will supplement   Length of Stay: [place] PA-C 10/30/2019, 7:41 AM  Advanced Heart Failure Team Pager [phone] (M-F; 7a - 4p)  Please contact [facility] for night-coverage after hours (4p -7a ) and weekends on [email or web address]  Patient seen and examined with the above-signed Advanced Practice Provider and/or Housestaff. I personally reviewed laboratory data, imaging studies and relevant notes. I independently examined the patient and formulated the important aspects of the plan. I have edited the note to reflect any of my changes or salient points. I have personally discussed the plan with the patient and/or family.  Continues to do well. Progressing slowly with rehab. Still quite weak. Underwent trach change today with CCM. BP remains elevated. Increasing carvedilol. Supp mag. Continue to work toward CIR placement.   Glori Bickers, MD  4:50 PM

## 2019-10-30 NOTE — Telephone Encounter (Signed)
Called and spoke with wife and informed of who is treating Han's GERD. Patient's wife verbalized understanding.

## 2019-10-31 LAB — GLUCOSE, CAPILLARY
Glucose-Capillary: 111 mg/dL — ABNORMAL HIGH (ref 70–99)
Glucose-Capillary: 111 mg/dL — ABNORMAL HIGH (ref 70–99)
Glucose-Capillary: 116 mg/dL — ABNORMAL HIGH (ref 70–99)
Glucose-Capillary: 121 mg/dL — ABNORMAL HIGH (ref 70–99)
Glucose-Capillary: 122 mg/dL — ABNORMAL HIGH (ref 70–99)
Glucose-Capillary: 148 mg/dL — ABNORMAL HIGH (ref 70–99)
Glucose-Capillary: 53 mg/dL — ABNORMAL LOW (ref 70–99)
Glucose-Capillary: 86 mg/dL (ref 70–99)

## 2019-10-31 MED ORDER — DEXTROSE 50 % IV SOLN
INTRAVENOUS | Status: AC
  Filled 2019-10-31: qty 50

## 2019-10-31 MED ORDER — ESCITALOPRAM OXALATE 10 MG PO TABS
20.0000 mg | ORAL_TABLET | Freq: Every day | ORAL | Status: DC
  Administered 2019-11-01 – 2019-11-11 (×11): 20 mg
  Filled 2019-10-31 (×12): qty 2

## 2019-10-31 MED ORDER — DEXTROSE 50 % IV SOLN
50.0000 mL | Freq: Once | INTRAVENOUS | Status: AC
  Administered 2019-10-31: 50 mL via INTRAVENOUS

## 2019-10-31 NOTE — Progress Notes (Signed)
Patient ID: [name], male   DOB: 1975/08/29, 44 y.o.   MRN: [id number]    Advanced Heart Failure Rounding Note   Subjective:    Continues to work with PT. Strength improving very slowly.   Denies SOB, orthopnea or PND.  Doing well with PMV valvel   Objective:   Weight Range:  Vital Signs:   Temp:  [97.8 F (36.6 C)-99.2 F (37.3 C)] 98.2 F (36.8 C) (03/25 2102) Pulse Rate:  [80-108] 93 (03/25 2300) Resp:  [15-24] 21 (03/25 2100) BP: (99-166)/(47-124) 126/62 (03/25 2300) SpO2:  [89 %-100 %] 95 % (03/25 2300) FiO2 (%):  [28 %] 28 % (03/25 1457) Weight:  [888 kg] 118 kg (03/25 0400) Last BM Date: 10/31/19  Weight change: Filed Weights   10/29/19 0600 10/30/19 0500 10/31/19 0400  Weight: 126.4 kg 120.4 kg 118 kg    Intake/Output:   Intake/Output Summary (Last 24 hours) at 10/31/2019 2332 Last data filed at 10/31/2019 2200 Gross per 24 hour  Intake 2580 ml  Output 2125 ml  Net 455 ml     PHYSICAL EXAM: General:  Obese WM, laying in bed. No resp difficulty HEENT: normal Neck: supple. no JVD. + trach collar Carotids 2+ bilat; no bruits. No lymphadenopathy or thryomegaly appreciated. Cor: PMI nondisplaced. Regular rate & rhythm. No rubs, gallops or murmurs. Lungs: clear Abdomen: obese soft, nontender, nondistended. No hepatosplenomegaly. No bruits or masses. Good bowel sounds. + PEG Extremities: no cyanosis, clubbing, rash, edema Neuro: alert & orientedx3, cranial nerves grossly intact. Remains very weak in all 4 extremities. Affect pleasant  Telemetry: sinus 90s Personally reviewed    Labs: Basic Metabolic Panel: Recent Labs  Lab 10/25/19 0354 10/25/19 0354 10/26/19 0450 10/26/19 0450 10/27/19 0444 10/27/19 1635 10/28/19 0404 10/30/19 0326  NA 144   < > 144  --  145 146* 145 145  K 4.1   < > 4.0  --  3.9 4.1 3.8 3.9  CL 106  --  103  --  103  --  101 101  CO2 30  --  32  --  34*  --  33* 34*  GLUCOSE 98  --  129*  --  120*  --  94 141*   BUN 43*  --  39*  --  39*  --  39* 35*  CREATININE 0.55*  --  0.39*  --  0.51*  --  0.51* 0.62  CALCIUM 9.0   < > 9.6   < > 9.3  --  9.3 9.7  MG 2.0  --  1.8  --  1.7  --  1.9 1.7  PHOS 4.3  --  4.6  --  4.1  --  4.4 4.8*   < > = values in this interval not displayed.    Liver Function Tests: No results for input(s): AST, ALT, ALKPHOS, BILITOT, PROT, ALBUMIN in the last 168 hours. No results for input(s): LIPASE, AMYLASE in the last 168 hours. No results for input(s): AMMONIA in the last 168 hours.  CBC: Recent Labs  Lab 10/25/19 0354 10/25/19 0354 10/26/19 0450 10/27/19 0444 10/27/19 1635 10/28/19 0404 10/30/19 0326  WBC 6.3  --  7.7 7.4  --  6.3 7.7  HGB 10.4*   < > 11.0* 11.2* 12.9* 10.9* 11.8*  HCT 35.5*   < > 37.8* 38.8* 38.0* 37.4* 39.8  MCV 95.7  --  96.4 96.8  --  95.2 93.4  PLT 213  --  214 189  --  186 159   < > = values in this interval not displayed.    Cardiac Enzymes: No results for input(s): CKTOTAL, CKMB, CKMBINDEX, TROPONINI in the last 168 hours.  BNP: BNP (last 3 results) Recent Labs    07/22/19 1039  BNP 15.3    ProBNP (last 3 results) No results for input(s): PROBNP in the last 8760 hours.    Other results:  Imaging: No results found.   Medications:     Scheduled Medications: . amLODipine  10 mg Per Tube Daily  . vitamin C  500 mg Per Tube Daily  . carvedilol  25 mg Per Tube BID WC  . chlorhexidine  15 mL Mouth Rinse BID  . Chlorhexidine Gluconate Cloth  6 each Topical Daily  . clonazepam  1 mg Per Tube QHS  . cloNIDine  0.1 mg Oral Daily  . enoxaparin (LOVENOX) injection  60 mg Subcutaneous Q24H  . [START ON 11/01/2019] escitalopram  20 mg Per Tube Daily  . feeding supplement (PRO-STAT SUGAR FREE 64)  60 mL Per Tube TID  . free water  150 mL Per Tube Q6H  . Gerhardt's butt cream   Topical Daily  . insulin aspart  0-15 Units Subcutaneous Q6H  . ipratropium-albuterol  3 mL Nebulization TID  . mouth rinse  15 mL Mouth Rinse  q12n4p  . melatonin  6 mg Per Tube QHS  . montelukast  10 mg Per Tube QHS  . nutrition supplement (JUVEN)  1 packet Per Tube BID BM  . pantoprazole sodium  40 mg Per Tube BID  . polycarbophil  625 mg Oral Daily  . psyllium  1 packet Oral TID  . QUEtiapine  25 mg Per Tube QHS  . sodium chloride flush  10-40 mL Intracatheter Q12H  . spironolactone  25 mg Oral Daily  . traZODone  100 mg Per Tube QHS    Infusions: . sodium chloride Stopped (10/13/19 0548)  . feeding supplement (OSMOLITE 1.5 CAL) 70 mL/hr at 10/31/19 2200    PRN Medications: sodium chloride, acetaminophen, albuterol, calcium carbonate, [COMPLETED] diazepam **FOLLOWED BY** diazepam, diphenoxylate-atropine, diphenoxylate-atropine, hydrALAZINE, HYDROmorphone, ondansetron (ZOFRAN) IV, sodium chloride, sodium chloride flush   Assessment/Plan:    1. Acute hypoxic/hypercarbic respiratory failure due to COVID PNA: Remained markedly hypoxic despite full support. Intubated 12/26. S/p bilateral CTs 12/26 for pneumothoraces.  TEE on 12/26 LVEF 70% RV ok. VV ECMO begun 12/26. Tracheostomy 12/29.  COVID ARDS at this point. ECMO circuit changed 1/8 & 1/23. Crescent cannula placed 2/2 (femoral cannuals removed) - Decannulated 3/4 - Still w/ trach collar but currently on [place]. O2 sats stable.  - PCCM following and have downsized trach. Feel that ongoing weakness is biggest barrier to decannulation. Continue TC trials     2. COVID PNA: management of COVID infection per CCM. CXR with bilateral multifocal PNA progressing to likely ARDS.  - s/p 10 days of remdesivir - 12/16, 12/2 s/p tociluzimab - 12/17 s/p convalescent plasma - Decadron completed 1/11 - Resolved  3. FEN - Remains on TFs. Loose stools improved with change in regimen - Still NPO pending FEES  4. Hypernatremia -Na stable yesterday at 145 -Continue free water. 150 q6  5. DTIs - On both feet.  Improving  6.  Hypokalemia. - no labs today. Recheck in am   7.   Diffuse muscle weakness. -This is consistent with ICU/steroid/paralytics myopathy.  Continue passive range of motion exercises with PT.   -PT and OT are following.  Both have recommended  CIR.  CIR following closley and feels he is not ready yet. Continue to push w/ acute-side PT/OT to maximize strength, bed mobility, OOB to chair. - continue PT today  8. HTN - BP improved with increased carvedilol   10. Hypomag - supp as needed  Length of Stay: 101  Glori Bickers MD 10/31/2019, 11:32 PM  Advanced Heart Failure Team Pager [phone] (M-F; [place])  Please contact [facility] for night-coverage after hours (4p -7a ) and weekends on [email or web address]

## 2019-10-31 NOTE — Progress Notes (Signed)
NAME:  [name], MRN:  [id number], DOB:  06/23/1976, LOS: 73 ADMISSION DATE:  07/22/2019, CONSULTATION DATE:  12/24 REFERRING MD:  Sloan Leiter, CHIEF COMPLAINT:  Dyspnea   Brief History   44 y/o male admitted 12/14 with COVID pneumonia leading to ARDS, pneumomediastinum.  Intubated 12/26, bilateral chest tubes placed.  Past Medical History  GERD HTN Asthma  Significant Hospital Events   12/14 admit with hypoxemic respiratory failure in setting of COVID-19 pneumonia 12/23 noted to have pneumomediastinum on chest x-ray 12/24PCCM consulted for evaluation 12/26 worsening hypoxemia, intubated 12/27 VV fem/fem ECMO cannulation 12/29 tracheostomy >> See bedside nursing event notes for full rundown of procedures after 12/27 1/5 epistaxis, nasal packing 1/30 improved cxr, improved lung compliance, TV 350s on 15PC  1/31 CXR increased infiltrates, +CFB     2/2 Converted to right IJ Crescent cannula. 2/3 PEG tube inserted at bedside 2/14 30 min sweep trial> paO2 105 (18 PEEP, 60% FiO2) pH 7.22, pCO2 87 2/20 2 hours sweep trial 2/21 1.5 hour sweep trial 2/24 7-hour sweep trial.  Increasingly awake. 3/2 - decannulation attempt aborted due to increased hypercapnia when sedated for procedure 3/4- decannulated 3/8 PEG tube repositioned by IR 3/9 > trach wean 3/14 started 24/7 TC 3/23 starting capping trials 3/24 changed to 5 prox xlt cuffless Consults:  PCCM, CTS, CHF, Palliative  Procedures:  PICC 1/3 >> R radial a-line 1/20 > 3/5 R IJ ECMO cath 2/3 > 3/4  Tracheostomy 12/29 >> 10/14/2019 PEG tube replacement>>  Significant Diagnostic Tests:  CT chest 12/22>extensive pneumomediastinum, multifocal patchy bilateral groundglass opacities CXR 1/25 > improving bilateral infiltrates. ECMO cannulae in place. CXR 2/5 > clearing bilateral infiltrates  Micro Data:  BCx2 12/14 >negative  Sputum 12/15 > OPF 12/28 blood > negative 12/29 > staph aureus 1/4 bronch:  1/2 acid fast  smear bronch: negative 1/2 fungal cx bronch: negative1/12 BAL: serratia, staph epidermidis 1/4 BAL > MSSA, Serratia 1/9 blood > negative 1/9 Resp culture > MSSA, serratia 1/11 serratia > negative 1/12 fungus culture > negative 1/12 BAL > serratia 1/18 BAL >> rare Serratia. 1/24 blood > negative 1/30 blood > negative 1/30 resp culture > serratia 2/1 BAL > rare Serratia - enterococcus 2/1 aspergillus Ag > negative 2/2 resp culture > serratia  2/2 blood > negative 2/5 urine > negative 2/9 resp: serratia 2/15 BAL serratia 3/5 blood cultures ngtd  Antimicrobials:  Received remdesivir, steroids, actemra and convalescent plasma  See MAR for remainder  Interim history/subjective:  Endorses depression, and frustration, no difficulty breathing. Objective   Blood pressure (Abnormal) 133/47, pulse (Abnormal) 105, temperature 97.9 F (36.6 C), temperature source Oral, resp. rate 18, height _0  (1.727 m), weight 118 kg, SpO2 97 %.    FiO2 (%):  [28 %-32 %] 28 %   Intake/Output Summary (Last 24 hours) at 10/31/2019 1137 Last data filed at 10/31/2019 1028 Gross per 24 hour  Intake 1960 ml  Output 2105 ml  Net -145 ml   Filed Weights   10/29/19 0600 10/30/19 0500 10/31/19 0400  Weight: 126.4 kg 120.4 kg 118 kg    Examination: General 44 year old white male resting in bed no acute distress HEENT normocephalic atraumatic no jugular venous distention appreciated size 5 proximal XLT [place] in place, red cap in place, phonation quality excellent Pulmonary: Diminished bases no accessory use Cardiac: Regular rate and rhythm Abdomen: Soft nontender Extremities: Reminds significantly weak, able to barely lift upper extremities against gravity Neuro: Awake oriented no focal deficits  Resolved Hospital Problem list    ARDS due to COVID 19 Decannulated from VV ECMO on 3/4 after 65 days  Pneumothoraces-resolved. Dislodged PEG tube Indurated area of the skin right lateral PEG  tube. Serratia pneumonia> treated.  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  He remains quite weak, this is his primary barrier to decannulation, which time I expect will be able to take the trach out  Plan Keep current trach in place until time of decannulation Keep trach In place with continuous pulse oximetry Continue to focus on physical therapy and rehabilitation needs Have increased SSRI given he continues to endorse significant depression We will continue to follow  Erick Colace ACNP-BC [place] Pager # [phone] OR # [phone] if no answer

## 2019-10-31 NOTE — Progress Notes (Signed)
  Speech Language Pathology Treatment: Dysphagia  Patient Details Name: [name] MRN: [id number] DOB: 12/02/1975 Today's Date: 10/31/2019 Time: 6213-0865 SLP Time Calculation (min) (ACUTE ONLY): 26 min  Assessment / Plan / Recommendation Clinical Impression  Pt was seen for dysphagia treatment and was cooperative throughout the session. Pt continues to voice fears regarding his eating/drinking but was amenable to p.o. trials. Trach remains capped and vocal quality was within normal limits. Pt tolerated dysphagia 2 solids, regular texture solids, mixed consistency boluses, and thin liquids via cup without overt s/sx of aspiration or symptoms of oral phase dysphagia. He demonstrated effortful swallows with occasional cues and completed the mendelsohn maneuver with min-mod cues maintaining hyolaryngeal elevation for a max of 8 seconds. A modified barium swallow study is recommended to re-assess the functional integrity of the swallow mechanism and is ordered for 11/01/19. Pt, nursing and attending have been advised of this and all parties verbalized agreement with plan of care.   HPI HPI: Pt is a 44 y/o male admitted 12/14 with COVID pneumonia leading to ARDS and needing mechanical ventilation, pneumomediastinum. He was intubated 12/26-12/29 with trach placement on latter date. PEG tube done on 09/11/19 and replaced on 10/14/19 after dislodging. Decannulated from VV ECMO on 3/4 after 65 days. SLP was consulted for possible PMSV placement. Pt downsized to #6 distal cuffless on 10/22/19. Capping trials 3/23. FEES 3/19 with pharyngoesophageal dysphagia noted and backflow of POs from esophagus aspirated; recs were to remain NPO.       SLP Plan  New goals to be determined pending instrumental study       Recommendations  Diet recommendations: Other(comment)(P.o. trials with SLP) Medication Administration: Via alternative means      Patient may use Passy-Muir Speech Valve: During all waking hours  (remove during sleep)(However, pt's trach is now capped)         Oral Care Recommendations: Oral care QID Follow up Recommendations: Inpatient Rehab SLP Visit Diagnosis: Dysphagia, pharyngoesophageal phase (R13.14) Plan: New goals to be determined pending instrumental study       [name] I. [name], [place], [place] Office number [phone] Pager [place] 10/31/2019, 5:38 PM

## 2019-10-31 NOTE — Progress Notes (Signed)
Hypoglycemic Event  CBG: 53   Treatment: dextrose 50% injection 5m  Symptoms: asymptomatic  Follow-up CBG: Time: 0058 CBG Result: 1[name]

## 2019-10-31 NOTE — Progress Notes (Signed)
Nutrition Follow up  DOCUMENTATION CODES:   Obesity unspecified  INTERVENTION:   Continue tube feeding:  -Osmolite 1.5 @ 70 ml/hr via PEG (1680 ml) -60 ml Prostat TID -Free water flushes 150 ml Q6 hours   At goal TF provides: 3120 kcal, 195 grams protein, and 1280 ml free water (1880 ml with flushes).   NUTRITION DIAGNOSIS:   Increased nutrient needs related to acute illness(COVID PNA) as evidenced by estimated needs.  Ongoing  GOAL:   Patient will meet greater than or equal to 90% of their needs   Addressed via TF  MONITOR:   Diet advancement, Skin, TF tolerance, Weight trends, Labs, I & O's  REASON FOR ASSESSMENT:   Consult Enteral/tube feeding initiation and management  ASSESSMENT:   Patient with PMH significant for GERD, HTN, and asthma. Presents this admission with COVID-19 PNA.   12/14- admit 12/23- chest xray revealed pneumomediastinum  12/24- developed bilateral pneumothoraces  12/26- worsening hypoxemia, intubated  12/27- s/p VV fem/fem ECMO cannulation  12/29- s/p trach  1/5- nasal bleeding- packing per CCM 1/7- nasal packing per ENT 1/8- trach exchanged, ECMO circuit changed  1/20- s/p additional RIJ drainage cannula  1/21- Cortrak kinked, replaced by OGT, pt vomited over night 1/22- ECMO circuit exchanged  1/23- nasal endoscopy, control of epistaxis  2/2- TEE, Cortrak removed, change to crescent catheter, removal of femoral lines 2/3- s/p PEG, EGD 2/15- developed recurrent PNA/sepsis  3/2- decannulation cancelled 3/4- s/p decannulation VV ECMO 3/7- PEG dislodged  3/8- s/p PEG replacement by IR   Pt discussed during ICU rounds and with RN.   Trach downsized yesterday. Tolerating capping trials. Continues to work with PT daily. During conversation, pt reports anxiety is preventing him from sleeping. He is worried about the unknown.   Worked with SLP yesterday and was able to tolerated sliced peaches without signs of aspiration. Plan for  instrumental study by the end of this week/early next week.   Had small amount of vomiting this am but thinks it's a result of his mouth being suctioned (makes him nauseous). Will continue with continuous TF for now and decrease rate once diet advanced/PO intake progresses.   Admission weight: 136.1 kg  Current weight: 118 kg (down 3 kg over the last week)  I/O: +4,149 ml since 3/11  UOP: 1,750 ml x 24 hrs   Medications: 500 mg Vit C, SS novolog, metamucil, fibercon, aldactone Labs: Phosphorus 4.8 (H) CBG 83-141  Diet Order:   Diet Order            Diet NPO time specified  Diet effective midnight              EDUCATION NEEDS:   Not appropriate for education at this time  Skin:  Skin Assessment: Skin Integrity Issues: Skin Integrity Issues:: DTI, Stage II, Stage III DTI: bilateral heels Stage II: buttocks Stage III: n/a Incisions: L wrist, neck Other: n/a  Last BM:  3/25  Height:   Ht Readings from Last 1 Encounters:  08/09/19 5' 8"  (1.727 m)    Weight:   Wt Readings from Last 1 Encounters:  10/31/19 118 kg    Ideal Body Weight:  70 kg  BMI:  Body mass index is 39.55 kg/m.  Estimated Nutritional Needs:   Kcal:  7342-8768 kcal (25-30 kcal/kg)  Protein:  180-215 grams  Fluid:  >/= 2.5 L/day   [name] RD, LDN Clinical Nutrition Pager # - [phone]

## 2019-10-31 NOTE — Progress Notes (Signed)
Occupational Therapy Treatment Patient Details Name: [name] MRN: [id number] DOB: [birth year] Today's Date: 10/31/2019    History of present illness 44 y/o M admitted 12/14 with COVID pneumonia causing acute hypoxemic respiratory failure.   Developed pneumomediastinum 12/23 with concern for possible bilateral small pneumothoraces on 12/24. Pt intubated on 12/26 and started on ECMO on 12/27. Tracheostomy on 12/29 with bleeding from trach an dother sites on 12/31. Bronch on 1/11 and 1/15. Slowly weaning sedation 2/5. Continuing to wean sedation and starting sweep trials to wean ECMO - Decannulated 3/4   OT comments  Session focused on BUE and trunk strengthening at bed level incorporating PNF and NDT techniques to increase UE ROM and strength. Pt seen on 2L with VSS - Trach capped. Pt with increased strength throughout BUE with increased initiation of elbow and shoulder flexion. Appears to be getting impinged B shoulders. R shoulder K-taped to improve scapulohumeral position. Pt assisting with bed mobility and beginning to use L hand to assist with grooming with assist for control. Pt very motivated to work with OT. Pt is hopeful of passing his swallow test so he can drink "Cheerwine". Pt very appreciative.   Follow Up Recommendations  CIR;Supervision/Assistance - 24 hour    Equipment Recommendations  Other (comment)(TBA)    Recommendations for Other Services      Precautions / Restrictions Precautions Precautions: Fall;Other (comment) Precaution Comments: PEG, trach collar, bilat foot wounds Required Braces or Orthoses: Other Brace Other Brace: B prevalon boots       Mobility Bed Mobility    supine - sidelying with Max A; able to push backinto supine with mod A              Transfers                      Balance                              not addressed this session             ADL either performed or assessed with clinical judgement    ADL         Grooming Details (indicate cue type and reason): assisting with holding cloth to nose adn blowing nose; using grip to help hold cloth adn pinch nostral; wiping mouth with cloth                                     Vision       Perception     Praxis      Cognition Arousal/Alertness: Awake/alert Behavior During Therapy: Anxious;WFL for tasks assessed/performed                                   General Comments: continues to demosntrate improvement        Exercises General Exercises - Upper Extremity Shoulder Flexion: AAROM;Both;20 reps;Sidelying;Supine Shoulder Extension: AAROM;AROM;Both;20 reps;Sidelying Shoulder ADduction: Both;20 reps;Supine Shoulder Horizontal ABduction: AAROM;AROM;20 reps;Supine Shoulder Horizontal ADduction: AAROM;Both;20 reps;Supine Elbow Flexion: AAROM;PROM;Both;20 reps;Sidelying Elbow Extension: AROM;Both;20 reps;Supine;Sidelying Wrist Flexion: AROM;Strengthening;Both;20 reps;Supine Wrist Extension: PROM;AAROM;Both;15 reps;Supine Digit Composite Flexion: Both;20 reps;Seated;Squeeze ball(place and hold/contract/relax) Composite Extension: AAROM;PROM;Both;15 reps;Supine Other Exercises Other Exercises: PNF scapular strengthening in sidelyingupward/downward both ant/post;protraction/retraction Other Exercises: PNF diagonals in supine D1/D2 patterns - increased  stregth in shoulder girlde, especialy extensor.improved ability to externally rotate ad initiate lift off Other Exercises: supination/pronation Other Exercises: facilitationg of trunk rotation from spine to sidelying - reaching/crossing midline R/L Other Exercises: incorporated core strengthening during PNF movements wiht pt reaching up to ceign, facilitating obliques   Shoulder Instructions       General Comments PT had BM during session. Pt assisted with rolling side to side when cleaning    Pertinent Vitals/ Pain       Pain Assessment:  Faces Faces Pain Scale: Hurts a little bit Pain Location: R anterior shoulder with FF Pain Descriptors / Indicators: Aching;Grimacing Pain Intervention(s): Limited activity within patient's tolerance;Repositioned(K-tape used)  Home Living                                          Prior Functioning/Environment              Frequency  Min 3X/week        Progress Toward Goals  OT Goals(current goals can now be found in the care plan section)  Progress towards OT goals: Progressing toward goals  Acute Rehab OT Goals Patient Stated Goal: regain strength and independence OT Goal Formulation: With patient Time For Goal Achievement: 11/07/19 Potential to Achieve Goals: Good ADL Goals Pt Will Perform Upper Body Bathing: with max assist;sitting Additional ADL Goal #1: Pt will maintain midline postural control in supported sitting x 5 min with min guard A in preparation for ADL training Additional ADL Goal #2: Pt will complete sit - stand activity with Max A +2 x 1 min with use of lift equipment  to increase strength for functional mobility Additional ADL Goal #3: Pt will be able to move Lt UE to mouth with mod A Additional ADL Goal #4: Pt will demonstrate B active elbow flexion in gravity eliminated position to increase funcitonal use of arms  Plan Discharge plan remains appropriate    Co-evaluation                 AM-PAC OT "6 Clicks" Daily Activity     Outcome Measure   Help from another person eating meals?: Total Help from another person taking care of personal grooming?: A Lot Help from another person toileting, which includes using toliet, bedpan, or urinal?: Total Help from another person bathing (including washing, rinsing, drying)?: Total Help from another person to put on and taking off regular upper body clothing?: Total Help from another person to put on and taking off regular lower body clothing?: Total 6 Click Score: 7    End of  Session Equipment Utilized During Treatment: Oxygen  OT Visit Diagnosis: Muscle weakness (generalized) (M62.81);Other abnormalities of gait and mobility (R26.89);Other symptoms and signs involving cognitive function;Pain Pain - Right/Left: Right Pain - part of body: Shoulder   Activity Tolerance Patient tolerated treatment well   Patient Left in bed;with call bell/phone within reach;with nursing/sitter in room   Nurse Communication Mobility status;Other (comment)(positioning/completion of exercises)        Time: 1700-1758 OT Time Calculation (min): 58 min  Charges: OT General Charges $OT Visit: 1 Visit OT Treatments $Self Care/Home Management : 8-22 mins $Neuromuscular Re-education: 23-37 mins $Therapeutic Exercise: 8-22 mins  Maurie Boettcher, OT/L   Acute OT Clinical Specialist [place] Pager [phone] Office [phone]    [facility] 10/31/2019, 6:13 PM

## 2019-11-01 ENCOUNTER — Inpatient Hospital Stay (HOSPITAL_COMMUNITY): Payer: Managed Care, Other (non HMO)

## 2019-11-01 LAB — GLUCOSE, CAPILLARY
Glucose-Capillary: 155 mg/dL — ABNORMAL HIGH (ref 70–99)
Glucose-Capillary: 107 mg/dL — ABNORMAL HIGH (ref 70–99)
Glucose-Capillary: 114 mg/dL — ABNORMAL HIGH (ref 70–99)

## 2019-11-01 LAB — BASIC METABOLIC PANEL
Anion gap: 9 (ref 5–15)
BUN: 43 mg/dL — ABNORMAL HIGH (ref 6–20)
CO2: 33 mmol/L — ABNORMAL HIGH (ref 22–32)
Calcium: 9.3 mg/dL (ref 8.9–10.3)
Chloride: 101 mmol/L (ref 98–111)
Creatinine, Ser: 0.53 mg/dL — ABNORMAL LOW (ref 0.61–1.24)
GFR calc Af Amer: >60 mL/min (ref >60)
GFR calc non Af Amer: >60 mL/min (ref >60)
Glucose, Bld: 115 mg/dL — ABNORMAL HIGH (ref 70–99)
Potassium: 3.9 mmol/L (ref 3.5–5.1)
Sodium: 143 mmol/L (ref 135–145)

## 2019-11-01 LAB — PHOSPHORUS: Phosphorus: 4.7 mg/dL — ABNORMAL HIGH (ref 2.5–4.6)

## 2019-11-01 LAB — CBC
HCT: 37.5 % — ABNORMAL LOW (ref 39.0–52.0)
Hemoglobin: 11.2 g/dL — ABNORMAL LOW (ref 13.0–17.0)
MCH: 28.1 pg (ref 26.0–34.0)
MCHC: 29.9 g/dL — ABNORMAL LOW (ref 30.0–36.0)
MCV: 94.2 fL (ref 80.0–100.0)
Platelets: 125 10*3/uL — ABNORMAL LOW (ref 150–400)
RBC: 3.98 MIL/uL — ABNORMAL LOW (ref 4.22–5.81)
RDW: 16.7 % — ABNORMAL HIGH (ref 11.5–15.5)
WBC: 6.7 10*3/uL (ref 4.0–10.5)
nRBC: 0 % (ref 0.0–0.2)

## 2019-11-01 LAB — MAGNESIUM: Magnesium: 1.8 mg/dL (ref 1.7–2.4)

## 2019-11-01 MED ORDER — POTASSIUM CHLORIDE 20 MEQ/15ML (10%) PO SOLN
20.0000 meq | Freq: Every day | ORAL | Status: AC
  Administered 2019-11-01 – 2019-11-02 (×2): 20 meq via ORAL
  Filled 2019-11-01 (×2): qty 15

## 2019-11-01 MED ORDER — ALBUMIN HUMAN 25 % IV SOLN
25.0000 g | Freq: Once | INTRAVENOUS | Status: AC
  Administered 2019-11-01: 25 g via INTRAVENOUS
  Filled 2019-11-01: qty 50

## 2019-11-01 MED ORDER — DOXAZOSIN MESYLATE 1 MG PO TABS
1.0000 mg | ORAL_TABLET | Freq: Every day | ORAL | Status: DC
  Administered 2019-11-01 – 2019-11-06 (×6): 1 mg
  Filled 2019-11-01 (×7): qty 1

## 2019-11-01 MED ORDER — LOPERAMIDE HCL 1 MG/7.5ML PO SUSP
2.0000 mg | ORAL | Status: DC | PRN
  Administered 2019-11-01 – 2019-11-03 (×2): 2 mg
  Filled 2019-11-01 (×3): qty 15

## 2019-11-01 MED ORDER — MAGNESIUM SULFATE 2 GM/50ML IV SOLN
2.0000 g | Freq: Once | INTRAVENOUS | Status: AC
  Administered 2019-11-01: 2 g via INTRAVENOUS
  Filled 2019-11-01: qty 50

## 2019-11-01 MED ORDER — POTASSIUM CHLORIDE CRYS ER 20 MEQ PO TBCR: 30.0000 meq | EXTENDED_RELEASE_TABLET | Freq: Once | ORAL | Status: DC

## 2019-11-01 MED ORDER — TAMSULOSIN HCL 0.4 MG PO CAPS: 0.4000 mg | ORAL_CAPSULE | Freq: Every day | ORAL | Status: DC

## 2019-11-01 MED ORDER — SODIUM CHLORIDE 0.9 % IV BOLUS
1000.0000 mL | Freq: Once | INTRAVENOUS | Status: AC
  Administered 2019-11-01: 1000 mL via INTRAVENOUS

## 2019-11-01 NOTE — Progress Notes (Signed)
eLink Physician-Brief Progress Note Patient Name: [name] DOB: October 02, 1975 MRN: [id number]   Date of Service  11/01/2019  HPI/Events of Note  Oliguria - Bladder scan with less than 100 mL residual. LVEF = 70-75%  eICU Interventions  Will order: 1. Will bolus with 0.9 NaCl 1 liter IV over 1 hour now.      Intervention Category Intermediate Interventions: Oliguria - evaluation and management  Chrishun Scheer [place] 11/01/2019, 1:04 AM

## 2019-11-01 NOTE — Progress Notes (Signed)
Physical Therapy Treatment Patient Details Name: [name] MRN: [id number] DOB: 07-07-1976 Today's Date: 11/01/2019    History of Present Illness 44 y/o M admitted 12/14 with COVID pneumonia causing acute hypoxemic respiratory failure.   Developed pneumomediastinum 12/23 with concern for possible bilateral small pneumothoraces on 12/24. Pt intubated on 12/26 and started on ECMO on 12/27. Tracheostomy on 12/29 with bleeding from trach an dother sites on 12/31. Bronch on 1/11 and 1/15. Slowly weaning sedation 2/5. Continuing to wean sedation and starting sweep trials to wean ECMO - Decannulated 3/4    PT Comments    Pt making slow, steady progress toward regaining strength and mobility.    Follow Up Recommendations  CIR;Supervision/Assistance - 24 hour     Equipment Recommendations  Other (comment)(To be determined)    Recommendations for Other Services       Precautions / Restrictions Precautions Precautions: Fall;Other (comment) Precaution Comments: PEG, trach collar, bilat foot wounds Required Braces or Orthoses: Other Brace Other Brace: B prevalon boots    Mobility  Bed Mobility Overal bed mobility: Needs Assistance Bed Mobility: Rolling Rolling: Max assist         General bed mobility comments: Assist to bring shoulder and hip over  Transfers                    Ambulation/Gait                 Stairs             Wheelchair Mobility    Modified Rankin (Stroke Patients Only)       Balance                                            Cognition Arousal/Alertness: Awake/alert Behavior During Therapy: Anxious;WFL for tasks assessed/performed Overall Cognitive Status: Impaired/Different from baseline Area of Impairment: Problem solving;Following commands;Memory                     Memory: Decreased short-term memory Following Commands: Follows one step commands consistently;Follows multi-step commands with  increased time     Problem Solving: Slow processing;Requires verbal cues        Exercises General Exercises - Lower Extremity Mini-Sqauts: Strengthening;Both;20 reps;Standing(On tilt bed at 35-45 degrees) Other Exercises Other Exercises: Core strengthening in Kreg tilt bed at 35-45 degrees performing trunk rotation leading with head and shoulder x 10 each side    General Comments        Pertinent Vitals/Pain Pain Assessment: Faces Faces Pain Scale: Hurts even more Pain Location: bil knees Pain Descriptors / Indicators: Aching;Grimacing Pain Intervention(s): Limited activity within patient's tolerance;Monitored during session;Repositioned    Home Living                      Prior Function            PT Goals (current goals can now be found in the care plan section) Acute Rehab PT Goals Patient Stated Goal: regain strength and independence Progress towards PT goals: Progressing toward goals    Frequency    Min 3X/week      PT Plan Current plan remains appropriate    Co-evaluation              AM-PAC PT "6 Clicks" Mobility   Outcome Measure  Help needed turning from your  back to your side while in a flat bed without using bedrails?: Total Help needed moving from lying on your back to sitting on the side of a flat bed without using bedrails?: Total Help needed moving to and from a bed to a chair (including a wheelchair)?: Total Help needed standing up from a chair using your arms (e.g., wheelchair or bedside chair)?: Total Help needed to walk in hospital room?: Total Help needed climbing 3-5 steps with a railing? : Total 6 Click Score: 6    End of Session Equipment Utilized During Treatment: Oxygen Activity Tolerance: Patient tolerated treatment well Patient left: in bed;with call bell/phone within reach Nurse Communication: Mobility status PT Visit Diagnosis: Muscle weakness (generalized) (M62.81);Other symptoms and signs involving the  nervous system (R29.898);Other abnormalities of gait and mobility (R26.89)     Time: 1203-1315(On bed pan for ~10 minutes) PT Time Calculation (min) (ACUTE ONLY): 72 min  Charges:  $Therapeutic Activity: 53-67 mins                     [place] Pager [phone] Office [place] 11/01/2019, 2:04 PM

## 2019-11-01 NOTE — Progress Notes (Signed)
Modified Barium Swallow Progress Note  Patient Details  Name: [name] MRN: [id number] Date of Birth: 07/04/1976  Today's Date: 11/01/2019  Modified Barium Swallow completed.  Full report located under Chart Review in the Imaging Section.  Brief recommendations include the following:  Clinical Impression  Pt was seen in radiology suite for modified barium swallow study. Trials of puree solids, regular texture solids, a 79m barium tablet (bitten in half per pt's preference), nectar thick liquids, and thin liquids via cup and straw were administered. He demonstrated mild pharyngeal phase dysphagia characterized by reduced lingual retraction and reduced anterior laryngeal movement. He demonstrated penetration (PAS 3) with consecutive swallows of thin liquids via straw and trace residue in the valleculae and pyriform sinuses. It is recommended that a dysphagia 3 diet with thin liquids be initiated at this time. SLP will continue to follow pt to assess diet tolerance and his ability to tolerate more advanced consistencies.    Swallow Evaluation Recommendations       SLP Diet Recommendations: Dysphagia 3 (Mech soft) solids;Thin liquid   Liquid Administration via: Straw   Medication Administration: Whole meds with liquid   Supervision: Full assist for feeding   Compensations: Slow rate;Other (Comment)(Individual sips via cup/straw)   Postural Changes: Remain semi-upright after after feeds/meals (Comment);Seated upright at 90 degrees   Oral Care Recommendations: Oral care BID       Jamar Casagrande I. PHardin Negus MGriggstown CBrooklyn ParkOffice number [phone] [phone] SHorton Marshall3/26/2021,12:14 PM

## 2019-11-01 NOTE — Progress Notes (Addendum)
Patient ID: [name], male   DOB: 04/12/1976, 44 y.o.   MRN: [id number]    Advanced Heart Failure Rounding Note   Subjective:    Was oliguric overnight and received albumin.   Had I/O cath with 700cc out  Mag supped as well.   Doing well with PMV.   Still weak. Working with PT    Objective:   Weight Range:  Vital Signs:   Temp:  [97.9 F (36.6 C)-99.2 F (37.3 C)] 98.4 F (36.9 C) (03/26 0719) Pulse Rate:  [79-112] 102 (03/26 0719) Resp:  [15-24] 24 (03/26 0719) BP: (99-157)/(47-86) 135/70 (03/26 0700) SpO2:  [89 %-100 %] 94 % (03/26 0719) FiO2 (%):  [28 %] 28 % (03/25 1457) Weight:  [118.8 kg] 118.8 kg (03/26 0500) Last BM Date: 10/31/19  Weight change: Filed Weights   10/30/19 0500 10/31/19 0400 11/01/19 0500  Weight: 120.4 kg 118 kg 118.8 kg    Intake/Output:   Intake/Output Summary (Last 24 hours) at 11/01/2019 0837 Last data filed at 11/01/2019 0700 Gross per 24 hour  Intake 3980.9 ml  Output 2270 ml  Net 1710.9 ml     PHYSICAL EXAM: General:  Obese WM, laying in bed. No resp difficulty HEENT: normal Neck: supple. no JVD + trach collar. Carotids 2+ bilat; no bruits. No lymphadenopathy or thryomegaly appreciated. Cor: PMI nondisplaced. Regular rate & rhythm. No rubs, gallops or murmurs. Lungs: clear Abdomen: obese soft, nontender, nondistended. No hepatosplenomegaly. No bruits or masses. Good bowel sounds. + PEG Extremities: no cyanosis, clubbing, rash, edema Neuro: alert & orientedx3, cranial nerves grossly intact. moves all 4 extremities w/o difficulty. Affect pleasant  Telemetry: sinus 90-100s Personally reviewed   Labs: Basic Metabolic Panel: Recent Labs  Lab 10/26/19 0450 10/26/19 0450 10/27/19 0444 10/27/19 0444 10/27/19 1635 10/28/19 0404 10/30/19 0326 11/01/19 0225  NA 144   < > 145  --  146* 145 145 143  K 4.0   < > 3.9  --  4.1 3.8 3.9 3.9  CL 103  --  103  --   --  101 101 101  CO2 32  --  34*  --   --  33* 34* 33*   GLUCOSE 129*  --  120*  --   --  94 141* 115*  BUN 39*  --  39*  --   --  39* 35* 43*  CREATININE 0.39*  --  0.51*  --   --  0.51* 0.62 0.53*  CALCIUM 9.6   < > 9.3   < >  --  9.3 9.7 9.3  MG 1.8  --  1.7  --   --  1.9 1.7 1.8  PHOS 4.6  --  4.1  --   --  4.4 4.8* 4.7*   < > = values in this interval not displayed.    Liver Function Tests: No results for input(s): AST, ALT, ALKPHOS, BILITOT, PROT, ALBUMIN in the last 168 hours. No results for input(s): LIPASE, AMYLASE in the last 168 hours. No results for input(s): AMMONIA in the last 168 hours.  CBC: Recent Labs  Lab 10/26/19 0450 10/26/19 0450 10/27/19 0444 10/27/19 1635 10/28/19 0404 10/30/19 0326 11/01/19 0225  WBC 7.7  --  7.4  --  6.3 7.7 6.7  HGB 11.0*   < > 11.2* 12.9* 10.9* 11.8* 11.2*  HCT 37.8*   < > 38.8* 38.0* 37.4* 39.8 37.5*  MCV 96.4  --  96.8  --  95.2 93.4 94.2  PLT 214  --  189  --  186 159 125*   < > = values in this interval not displayed.    Cardiac Enzymes: No results for input(s): CKTOTAL, CKMB, CKMBINDEX, TROPONINI in the last 168 hours.  BNP: BNP (last 3 results) Recent Labs    07/22/19 1039  BNP 15.3    ProBNP (last 3 results) No results for input(s): PROBNP in the last 8760 hours.    Other results:  Imaging: No results found.   Medications:     Scheduled Medications: . amLODipine  10 mg Per Tube Daily  . vitamin C  500 mg Per Tube Daily  . carvedilol  25 mg Per Tube BID WC  . chlorhexidine  15 mL Mouth Rinse BID  . Chlorhexidine Gluconate Cloth  6 each Topical Daily  . clonazepam  1 mg Per Tube QHS  . cloNIDine  0.1 mg Oral Daily  . enoxaparin (LOVENOX) injection  60 mg Subcutaneous Q24H  . escitalopram  20 mg Per Tube Daily  . feeding supplement (PRO-STAT SUGAR FREE 64)  60 mL Per Tube TID  . free water  150 mL Per Tube Q6H  . Gerhardt's butt cream   Topical Daily  . insulin aspart  0-15 Units Subcutaneous Q6H  . ipratropium-albuterol  3 mL Nebulization TID  .  mouth rinse  15 mL Mouth Rinse q12n4p  . melatonin  6 mg Per Tube QHS  . montelukast  10 mg Per Tube QHS  . nutrition supplement (JUVEN)  1 packet Per Tube BID BM  . pantoprazole sodium  40 mg Per Tube BID  . polycarbophil  625 mg Oral Daily  . potassium chloride  20 mEq Oral Daily  . psyllium  1 packet Oral TID  . QUEtiapine  25 mg Per Tube QHS  . sodium chloride flush  10-40 mL Intracatheter Q12H  . spironolactone  25 mg Oral Daily  . traZODone  100 mg Per Tube QHS    Infusions: . sodium chloride Stopped (10/13/19 0548)  . feeding supplement (OSMOLITE 1.5 CAL) 70 mL/hr at 11/01/19 0700    PRN Medications: sodium chloride, acetaminophen, albuterol, calcium carbonate, [COMPLETED] diazepam **FOLLOWED BY** diazepam, diphenoxylate-atropine, diphenoxylate-atropine, hydrALAZINE, HYDROmorphone, ondansetron (ZOFRAN) IV, sodium chloride, sodium chloride flush   Assessment/Plan:    1. Acute hypoxic/hypercarbic respiratory failure due to COVID PNA: Remained markedly hypoxic despite full support. Intubated 12/26. S/p bilateral CTs 12/26 for pneumothoraces.  TEE on 12/26 LVEF 70% RV ok. VV ECMO begun 12/26. Tracheostomy 12/29.  COVID ARDS at this point. ECMO circuit changed 1/8 & 1/23. [place] cannula placed 2/2 (femoral cannuals removed) - Decannulated 3/4 - Still w/ trach collar but currently on [place]. O2 sats stable.  - PCCM following and have downsized trach. Feel that ongoing weakness is biggest barrier to decannulation. Continue trach trials  And PT   2. COVID PNA: management of COVID infection per CCM. CXR with bilateral multifocal PNA progressing to likely ARDS.  - s/p 10 days of remdesivir - 12/16, 12/2 s/p tociluzimab - 12/17 s/p convalescent plasma - Decadron completed 1/11 - Resolved  3. FEN - Remains on TFs. Loose stools improved with change in regimen - Still NPO pending FEES this am  4. Hypernatremia -Na stable yesterday at 143  -Continue free water. 150 q6  5.  DTIs - On both feet.  Improving  6.  Hypokalemia. - K 3.9  7.  Diffuse muscle weakness. -This is consistent with ICU/steroid/paralytics myopathy.  Continue passive range of  motion exercises with PT.   -PT and OT are following.  Both have recommended CIR.  CIR following closley and feels he is not ready yet. Continue to push w/ acute-side PT/OT to maximize strength, bed mobility, OOB to chair. - continue PT today  8. HTN - BP improved with increased carvedilol   10. Hypomag - has been supped today  11. Urinary retention - add Flomax - reduce anti-cholinergics (stop seroquel)  - follow PVRs - BUN/cr and serum Na stable. Getting FW through the tube  Length of Stay: [place] MD 11/01/2019, 8:37 AM  Advanced Heart Failure Team Pager [phone] (M-F; [place])  Please contact [facility] for night-coverage after hours (4p -7a ) and weekends on [email or web address]

## 2019-11-01 NOTE — Progress Notes (Signed)
[facility] Progress Note Patient Name: [name] DOB: 1975-10-07 MRN: [id number]   Date of Service  11/01/2019  HPI/Events of Note  Mg++ = 1.8 and Creatinine = 0.53.  eICU Interventions  Will replace Mg++.      Intervention Category Major Interventions: Electrolyte abnormality - evaluation and management  Kween Bacorn [place] 11/01/2019, 3:38 AM

## 2019-11-01 NOTE — Progress Notes (Signed)
[facility] Progress Note Patient Name: [name] DOB: 01-Jul-1976 MRN: [id number]   Date of Service  11/01/2019  HPI/Events of Note  Oliguria - No real response to fluid challenge. Albumin = 1.8.   eICU Interventions  Will order: 1. Bolus with 25% Albumin 25 gm IV.     Intervention Category Intermediate Interventions: Oliguria - evaluation and management  Earsie Humm [place] 11/01/2019, 2:19 AM

## 2019-11-01 NOTE — Progress Notes (Signed)
  Speech Language Pathology Treatment: Dysphagia  Patient Details Name: [name] MRN: [id number] DOB: 05-10-1976 Today's Date: 11/01/2019 Time: 6948-5462 SLP Time Calculation (min) (ACUTE ONLY): 20 min  Assessment / Plan / Recommendation Clinical Impression  Pt was seen for dysphagia treatment and was cooperative throughout the session. He reported that he was somewhat excited to have his first meal p.o. meal this evening and SLP assisted pt with ordering it. He was re-educated regarding the results of the modified barium swallow study and the need to continue some of the exercises to facilitate further progress. Pt verbalized understanding as well as agreement. Pt declined intake of solids during the session but tolerated thin liquids via straw without overt s/sx of aspiration. SLP will continue to follow pt.    HPI HPI: Pt is a 44 y/o male admitted 12/14 with COVID pneumonia leading to ARDS and needing mechanical ventilation, pneumomediastinum. He was intubated 12/26-12/29 with trach placement on latter date. PEG tube done on 09/11/19 and replaced on 10/14/19 after dislodging. Decannulated from VV ECMO on 3/4 after 65 days. SLP was consulted for possible PMSV placement. Pt downsized to #6 distal cuffless on 10/22/19. Capping trials 3/23. FEES 3/19 with pharyngoesophageal dysphagia noted and backflow of POs from esophagus aspirated; recs were to remain NPO. MBS 3/26.21: mild pharyngeal phase dysphagia characterized by reduced lingual retraction and reduced anterior laryngeal movement. Dysphagia 3 with thin recommended.      SLP Plan  Continue with current plan of care       Recommendations  Diet recommendations: Dysphagia 3 (mechanical soft);Thin liquid Liquids provided via: Cup;Straw Medication Administration: Via alternative means Compensations: Slow rate;Other (Comment)(Individual sips via cup/straw) Postural Changes and/or Swallow Maneuvers: Seated upright 90 degrees;Upright 30-60 min  after meal                Oral Care Recommendations: Oral care BID Follow up Recommendations: Inpatient Rehab SLP Visit Diagnosis: Dysphagia, pharyngeal phase (R13.13) Plan: Continue with current plan of care       [name] I. [name], [place], [place] Office number [phone] Pager [place] 11/01/2019, 5:34 PM

## 2019-11-01 NOTE — Progress Notes (Signed)
SLP Cancellation Note  Patient Details Name: [name] MRN: [id number] DOB: [birth year]   Cancelled treatment:       Reason Eval/Treat Not Completed: Patient at procedure or test/unavailable(Pt working with physical therapy at this time. SLP will f/u)  [name], [name], [name] Office number [phone] Pager [name] 11/01/2019, 12:48 PM

## 2019-11-01 NOTE — Progress Notes (Signed)
eLink Physician-Brief Progress Note Patient Name: NAGI FURIO DOB: October 22, 1975 MRN: [id number]   Date of Service  11/01/2019  HPI/Events of Note  Oliguria - Bladder scan with residual = 679 mL.   eICU Interventions  Will order: 1. I/O Cath PRN.      Intervention Category Major Interventions: Other:  Dodie Parisi Cornelia Copa 11/01/2019, 6:13 AM

## 2019-11-01 NOTE — Progress Notes (Signed)
NAME:  [name], MRN:  [id number], DOB:  [birth year], LOS: 53 ADMISSION DATE:  07/22/2019, CONSULTATION DATE:  12/24 REFERRING MD:  Sloan Leiter, CHIEF COMPLAINT:  Dyspnea   Brief History   44 y/o male admitted 12/14 with COVID pneumonia leading to ARDS, pneumomediastinum.  Intubated 12/26, bilateral chest tubes placed.  Past Medical History  GERD HTN Asthma  Significant Hospital Events   12/14 admit with hypoxemic respiratory failure in setting of COVID-19 pneumonia 12/23 noted to have pneumomediastinum on chest x-ray 12/24PCCM consulted for evaluation 12/26 worsening hypoxemia, intubated 12/27 VV fem/fem ECMO cannulation 12/29 tracheostomy >> See bedside nursing event notes for full rundown of procedures after 12/27 1/5 epistaxis, nasal packing 1/30 improved cxr, improved lung compliance, TV 350s on 15PC  1/31 CXR increased infiltrates, +CFB     2/2 Converted to right IJ Crescent cannula. 2/3 PEG tube inserted at bedside 2/14 30 min sweep trial> paO2 105 (18 PEEP, 60% FiO2) pH 7.22, pCO2 87 2/20 2 hours sweep trial 2/21 1.5 hour sweep trial 2/24 7-hour sweep trial.  Increasingly awake. 3/2 - decannulation attempt aborted due to increased hypercapnia when sedated for procedure 3/4- decannulated 3/8 PEG tube repositioned by IR 3/9 > trach wean 3/14 started 24/7 TC 3/23 starting capping trials 3/24 changed to 5 prox xlt cuffless Consults:  PCCM, CTS, CHF, Palliative  Procedures:  PICC 1/3 >> R radial a-line 1/20 > 3/5 R IJ ECMO cath 2/3 > 3/4  Tracheostomy 12/29 >> 10/14/2019 PEG tube replacement>>  Significant Diagnostic Tests:  CT chest 12/22>extensive pneumomediastinum, multifocal patchy bilateral groundglass opacities CXR 1/25 > improving bilateral infiltrates. ECMO cannulae in place. CXR 2/5 > clearing bilateral infiltrates  Micro Data:  BCx2 12/14 >negative  Sputum 12/15 > OPF 12/28 blood > negative 12/29 > staph aureus 1/4 bronch:  1/2 acid fast  smear bronch: negative 1/2 fungal cx bronch: negative1/12 BAL: serratia, staph epidermidis 1/4 BAL > MSSA, Serratia 1/9 blood > negative 1/9 Resp culture > MSSA, serratia 1/11 serratia > negative 1/12 fungus culture > negative 1/12 BAL > serratia 1/18 BAL >> rare Serratia. 1/24 blood > negative 1/30 blood > negative 1/30 resp culture > serratia 2/1 BAL > rare Serratia - enterococcus 2/1 aspergillus Ag > negative 2/2 resp culture > serratia  2/2 blood > negative 2/5 urine > negative 2/9 resp: serratia 2/15 BAL serratia 3/5 blood cultures ngtd  Antimicrobials:  Received remdesivir, steroids, actemra and convalescent plasma  See MAR for remainder  Interim history/subjective:  No acute changes Objective   Blood pressure (Abnormal) 150/131, pulse 88, temperature 98.4 F (36.9 C), temperature source Axillary, resp. rate (Abnormal) 24, height _0  (1.727 m), weight 118.8 kg, SpO2 94 %.    FiO2 (%):  [28 %] 28 %   Intake/Output Summary (Last 24 hours) at 11/01/2019 1121 Last data filed at 11/01/2019 0945 Gross per 24 hour  Intake 3763.4 ml  Output 2265 ml  Net 1498.4 ml   Filed Weights   10/30/19 0500 10/31/19 0400 11/01/19 0500  Weight: 120.4 kg 118 kg 118.8 kg    Examination: General 44 year old white male resting in bed he is in no acute distress this morning HEENT normocephalic atraumatic orally intubated size 5 proximal cuffless XLT in place red capping valve in place excellent phonation Pulmonary: No accessory use today, mild left-sided rhonchi Cardiac regular rate and rhythm without murmur rub or gallop Abdomen is soft nontender Extremities are warm and dry Neuro awake, oriented, but still diffusely weak  Resolved Hospital  Problem list    ARDS due to COVID 19 Decannulated from VV ECMO on 3/4 after 65 days  Pneumothoraces-resolved. Dislodged PEG tube Indurated area of the skin right lateral PEG tube. Serratia pneumonia> treated.  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  He remains quite weak, this is his primary barrier to decannulation, which time I expect will be able to take the trach out  Plan Continue current routine tracheostomy care Continue capping with continuous pulse oximetry, eventually will get him decannulated but need him stronger  Biggest focus at this point needs to remain on his physical rehabilitation  We will see again on Monday  Zyion Doxtater E Kyrstal Monterrosa ACNP-BC [place] Pager # [phone] OR # [phone] if no answer

## 2019-11-01 NOTE — Progress Notes (Signed)
Chaplain engaged in follow-up visit with [name]  Chaplain engaged in a check-in to see where [name] is emotionally and mentally regarding his journey.  [name] expressed still feeling tired and not being able to rest.  He also expressed that he is just taking it one step at a time.  Chaplain asked how he felt about being back in the bed standing up and he proclaimed, "It feels weird because I haven't walked in so long." Chaplain affirmed [name]'s progress while acknowledging that his health journey has not been easy on him.    Chaplain will continue to follow-up.

## 2019-11-02 LAB — GLUCOSE, CAPILLARY
Glucose-Capillary: 113 mg/dL — ABNORMAL HIGH (ref 70–99)
Glucose-Capillary: 122 mg/dL — ABNORMAL HIGH (ref 70–99)
Glucose-Capillary: 127 mg/dL — ABNORMAL HIGH (ref 70–99)
Glucose-Capillary: 138 mg/dL — ABNORMAL HIGH (ref 70–99)
Glucose-Capillary: 99 mg/dL (ref 70–99)

## 2019-11-02 MED ORDER — FREE WATER
150.0000 mL | Freq: Two times a day (BID) | Status: DC
  Administered 2019-11-02 – 2019-11-05 (×6): 150 mL

## 2019-11-02 MED ORDER — IPRATROPIUM-ALBUTEROL 0.5-2.5 (3) MG/3ML IN SOLN
3.0000 mL | Freq: Two times a day (BID) | RESPIRATORY_TRACT | Status: DC
  Administered 2019-11-03 (×2): 3 mL via RESPIRATORY_TRACT
  Filled 2019-11-02 (×2): qty 3

## 2019-11-02 NOTE — Progress Notes (Signed)
Patient ID: [name], male   DOB: 06-24-1976, 44 y.o.   MRN: [id number]    Advanced Heart Failure Rounding Note   Subjective:    Struggling with incontinence.   Denies CP or SOB. On trach collar. Using PMV well. Continue to work with OT, PT and speech  Now on dyshagia 3 diet. Had coughing fit and emesis after eating last night. Denies overt reflux symptoms    Objective:   Weight Range:  Vital Signs:   Temp:  [96.4 F (35.8 C)-99.2 F (37.3 C)] 98.3 F (36.8 C) (03/27 0731) Pulse Rate:  [82-109] 86 (03/27 1100) Resp:  [16-27] 22 (03/27 1100) BP: (111-161)/(52-97) 111/66 (03/27 1100) SpO2:  [89 %-100 %] 91 % (03/27 1100) FiO2 (%):  [98 %] 98 % (03/26 1814) Weight:  [118.5 kg] 118.5 kg (03/27 0630) Last BM Date: 11/02/19  Weight change: Filed Weights   10/31/19 0400 11/01/19 0500 11/02/19 0630  Weight: 118 kg 118.8 kg 118.5 kg    Intake/Output:   Intake/Output Summary (Last 24 hours) at 11/02/2019 1109 Last data filed at 11/02/2019 1041 Gross per 24 hour  Intake 3260 ml  Output 1125 ml  Net 2135 ml     PHYSICAL EXAM: General:  Obese WM, lying in bed. No resp difficulty HEENT: normal Neck: supple. no JVD. On TC Carotids 2+ bilat; no bruits. No lymphadenopathy or thryomegaly appreciated. Cor: PMI nondisplaced. Regular rate & rhythm. No rubs, gallops or murmurs. Lungs: clear Abdomen: obese soft, nontender, nondistended. No hepatosplenomegaly. No bruits or masses. Good bowel sounds. Extremities: no cyanosis, clubbing, rash, edema Neuro: alert & orientedx3, cranial nerves grossly intact. Remains diffusely weak but improving  Affect pleasant   Telemetry: sinus 80-90s Personally reviewed   Labs: Basic Metabolic Panel: Recent Labs  Lab 10/27/19 0444 10/27/19 0444 10/27/19 1635 10/28/19 0404 10/30/19 0326 11/01/19 0225  NA 145  --  146* 145 145 143  K 3.9  --  4.1 3.8 3.9 3.9  CL 103  --   --  101 101 101  CO2 34*  --   --  33* 34* 33*  GLUCOSE  120*  --   --  94 141* 115*  BUN 39*  --   --  39* 35* 43*  CREATININE 0.51*  --   --  0.51* 0.62 0.53*  CALCIUM 9.3   < >  --  9.3 9.7 9.3  MG 1.7  --   --  1.9 1.7 1.8  PHOS 4.1  --   --  4.4 4.8* 4.7*   < > = values in this interval not displayed.    Liver Function Tests: No results for input(s): AST, ALT, ALKPHOS, BILITOT, PROT, ALBUMIN in the last 168 hours. No results for input(s): LIPASE, AMYLASE in the last 168 hours. No results for input(s): AMMONIA in the last 168 hours.  CBC: Recent Labs  Lab 10/27/19 0444 10/27/19 1635 10/28/19 0404 10/30/19 0326 11/01/19 0225  WBC 7.4  --  6.3 7.7 6.7  HGB 11.2* 12.9* 10.9* 11.8* 11.2*  HCT 38.8* 38.0* 37.4* 39.8 37.5*  MCV 96.8  --  95.2 93.4 94.2  PLT 189  --  186 159 125*    Cardiac Enzymes: No results for input(s): CKTOTAL, CKMB, CKMBINDEX, TROPONINI in the last 168 hours.  BNP: BNP (last 3 results) Recent Labs    07/22/19 1039  BNP 15.3    ProBNP (last 3 results) No results for input(s): PROBNP in the last 8760 hours.  Other results:  Imaging: DG Swallowing Func-Speech Pathology  Result Date: 11/01/2019 Objective Swallowing Evaluation: Type of Study: MBS-Modified Barium Swallow Study  Patient Details Name: [name] MRN: [id number] Date of Birth: 04/26/1976 Today's Date: 11/01/2019 Time: SLP Start Time (ACUTE ONLY): 46 -SLP Stop Time (ACUTE ONLY): 1020 SLP Time Calculation (min) (ACUTE ONLY): 15 min Past Medical History: Past Medical History: Diagnosis Date . Asthma  . GERD (gastroesophageal reflux disease)  . Headache  . History of kidney stones   LEFT URETERAL STONE . HTN (hypertension)  . Pancreatitis 2018  GALLBALDDER SLUDGE CAUSED ISSUED RESOLVED Past Surgical History: Past Surgical History: Procedure Laterality Date . CANNULATION FOR ECMO (EXTRACORPOREAL MEMBRANE OXYGENATION) N/A 08/28/2019  Procedure: CANNULATION FOR VV ECMO (EXTRACORPOREAL MEMBRANE OXYGENATION);  Surgeon: Prescott Gum, Collier Salina, MD;   Location: [facility];  Service: Open Heart Surgery;  Laterality: N/A;  CRESCENT CANNULA . CANNULATION FOR ECMO (EXTRACORPOREAL MEMBRANE OXYGENATION) N/A 09/10/2019  Procedure: CANNULATION FOR ECMO (EXTRACORPOREAL MEMBRANE OXYGENATION) PUTTING IN CRESCENT 32FR CANNULA  AND REMOVING GROING CANNULATION;  Surgeon: Wonda Olds, MD;  Location: [facility];  Service: Open Heart Surgery;  Laterality: N/A;  PUTTING IN CRESCENT/REMOVING GROIN CANNULATION . CYSTOSCOPY/URETEROSCOPY/HOLMIUM LASER/STENT PLACEMENT Left 04/18/2019  Procedure: LEFT URETEROSCOPY/HOLMIUM LASER/STENT PLACEMENT;  Surgeon: Ardis Hughs, MD;  Location: [facility];  Service: Urology;  Laterality: Left; . CYSTOSCOPY/URETEROSCOPY/HOLMIUM LASER/STENT PLACEMENT Left 05/02/2019  Procedure: CYSTOSCOPY/URETEROSCOPY/HOLMIUM LASER/STENT EXCHANGE;  Surgeon: Ardis Hughs, MD;  Location: [facility];  Service: Urology;  Laterality: Left; . ECMO CANNULATION N/A 08/03/2019  Procedure: ECMO CANNULATION;  Surgeon: Wonda Olds, MD;  Location: [place];  Service: Cardiovascular;  Laterality: N/A; . ESOPHAGOGASTRODUODENOSCOPY N/A 09/11/2019  Procedure: ESOPHAGOGASTRODUODENOSCOPY (EGD);  Surgeon: Wonda Olds, MD;  Location: [place];  Service: Thoracic;  Laterality: N/A; . IR [place] Vivianne Master  10/14/2019 . IRRIGATION AND DEBRIDEMENT SHOULDER Right 09/29/2017  Procedure: IRRIGATION AND DEBRIDEMENT SHOULDER;  Surgeon: Leandrew Koyanagi, MD;  Location: [place];  Service: Orthopedics;  Laterality: Right; . [place] SURGERY  2002 . NASAL ENDOSCOPY WITH EPISTAXIS CONTROL N/A 08/31/2019  Procedure: NASAL ENDOSCOPY WITH EPISTAXIS CONTROL WITH CAUTERIZATION;  Surgeon: Melida Quitter, MD;  Location: [place];  Service: ENT;  Laterality: N/A; . PORTACATH PLACEMENT N/A 09/11/2019  Procedure: PEG TUBE INSERTION - BEDSIDE;  Surgeon: Wonda Olds, MD;  Location: [place];  Service: Thoracic;  Laterality: N/A; . TEE WITHOUT CARDIOVERSION N/A  08/28/2019  Procedure: TRANSESOPHAGEAL ECHOCARDIOGRAM (TEE);  Surgeon: Prescott Gum, Collier Salina, MD;  Location: [place];  Service: Open Heart Surgery;  Laterality: N/A; . TEE WITHOUT CARDIOVERSION N/A 09/10/2019  Procedure: TRANSESOPHAGEAL ECHOCARDIOGRAM (TEE);  Surgeon: Wonda Olds, MD;  Location: [place];  Service: Open Heart Surgery;  Laterality: N/A; HPI: Pt is a 44 y/o male admitted 12/14 with COVID pneumonia leading to ARDS and needing mechanical ventilation, pneumomediastinum. He was intubated 12/26-12/29 with trach placement on latter date. PEG tube done on 09/11/19 and replaced on 10/14/19 after dislodging. Decannulated from VV ECMO on 3/4 after 65 days. SLP was consulted for possible PMSV placement. Pt downsized to #6 distal cuffless on 10/22/19. Capping trials 3/23. FEES 3/19 with pharyngoesophageal dysphagia noted and backflow of POs from esophagus aspirated; recs were to remain NPO.  No data recorded Assessment / Plan / Recommendation CHL IP CLINICAL IMPRESSIONS 11/01/2019 Clinical Impression Pt was seen in radiology suite for modified barium swallow study. Trials of puree solids, regular texture solids, a 41m barium tablet (bitten in half  per pt's preference), nectar thick liquids, and thin liquids via cup and straw were administered. He demonstrated mild pharyngeal phase dysphagia characterized by reduced lingual retraction and reduced anterior laryngeal movement. He demonstrated penetration (PAS 3) with consecutive swallows of thin liquids via straw and trace residue in the valleculae and pyriform sinuses. It is recommended that a dysphagia 3 diet with thin liquids be initiated at this time. SLP will continue to follow pt to assess diet tolerance and his ability to tolerate more advanced consistencies. SLP Visit Diagnosis Dysphagia, pharyngoesophageal phase (R13.14) Attention and concentration deficit following -- Frontal lobe and executive function deficit following -- Impact on safety and function Mild  aspiration risk   CHL IP TREATMENT RECOMMENDATION 11/01/2019 Treatment Recommendations Therapy as outlined in treatment plan below   Prognosis 11/01/2019 Prognosis for Safe Diet Advancement Good Barriers to Reach Goals Time post onset Barriers/Prognosis Comment -- CHL IP DIET RECOMMENDATION 11/01/2019 SLP Diet Recommendations Dysphagia 3 (Mech soft) solids;Thin liquid Liquid Administration via Straw Medication Administration Whole meds with liquid Compensations Slow rate;Other (Comment) Postural Changes Remain semi-upright after after feeds/meals (Comment);Seated upright at 90 degrees   CHL IP OTHER RECOMMENDATIONS 11/01/2019 Recommended Consults -- Oral Care Recommendations Oral care BID Other Recommendations --   CHL IP FOLLOW UP RECOMMENDATIONS 11/01/2019 Follow up Recommendations Inpatient Rehab   CHL IP FREQUENCY AND DURATION 11/01/2019 Speech Therapy Frequency (ACUTE ONLY) min 2x/week Treatment Duration 2 weeks      CHL IP ORAL PHASE 11/01/2019 Oral Phase WFL Oral - Pudding Teaspoon -- Oral - Pudding Cup -- Oral - Honey Teaspoon NT Oral - Honey Cup NT Oral - Nectar Teaspoon -- Oral - Nectar Cup WFL Oral - Nectar Straw -- Oral - Thin Teaspoon -- Oral - Thin Cup WFL Oral - Thin Straw -- Oral - Puree WFL Oral - Mech Soft -- Oral - Regular -- Oral - Multi-Consistency -- Oral - Pill -- Oral Phase - Comment --  CHL IP PHARYNGEAL PHASE 11/01/2019 Pharyngeal Phase Impaired Pharyngeal- Pudding Teaspoon -- Pharyngeal -- Pharyngeal- Pudding Cup -- Pharyngeal -- Pharyngeal- Honey Teaspoon NT Pharyngeal -- Pharyngeal- Honey Cup -- Pharyngeal -- Pharyngeal- Nectar Teaspoon NT Pharyngeal -- Pharyngeal- Nectar Cup -- Pharyngeal -- Pharyngeal- Nectar Straw -- Pharyngeal -- Pharyngeal- Thin Teaspoon -- Pharyngeal -- Pharyngeal- Thin Cup Penetration/Aspiration during swallow Pharyngeal Material enters airway, remains ABOVE vocal cords and not ejected out Pharyngeal- Thin Straw -- Pharyngeal -- Pharyngeal- Puree Pharyngeal residue - cp  segment;Pharyngeal residue - valleculae Pharyngeal -- Pharyngeal- Mechanical Soft -- Pharyngeal -- Pharyngeal- Regular Pharyngeal residue - valleculae;Pharyngeal residue - cp segment Pharyngeal -- Pharyngeal- Multi-consistency -- Pharyngeal -- Pharyngeal- Pill -- Pharyngeal -- Pharyngeal Comment --  CHL IP CERVICAL ESOPHAGEAL PHASE 11/01/2019 Cervical Esophageal Phase WFL Pudding Teaspoon -- Pudding Cup -- Honey Teaspoon -- Honey Cup -- Nectar Teaspoon -- Nectar Cup -- Nectar Straw -- Thin Teaspoon -- Thin Cup -- Thin Straw -- Puree -- Mechanical Soft -- Regular -- Multi-consistency -- Pill -- Cervical Esophageal Comment -- Shanika I. Hardin Negus, [place], [place] Office number [phone] Pager [place] 11/01/2019, 1:31 PM                Medications:     Scheduled Medications: . amLODipine  10 mg Per Tube Daily  . vitamin C  500 mg Per Tube Daily  . carvedilol  25 mg Per Tube BID WC  . chlorhexidine  15 mL Mouth Rinse BID  . Chlorhexidine Gluconate Cloth  6 each  Topical Daily  . clonazepam  1 mg Per Tube QHS  . doxazosin  1 mg Per Tube QHS  . enoxaparin (LOVENOX) injection  60 mg Subcutaneous Q24H  . escitalopram  20 mg Per Tube Daily  . feeding supplement (PRO-STAT SUGAR FREE 64)  60 mL Per Tube TID  . free water  150 mL Per Tube Q6H  . Gerhardt's butt cream   Topical Daily  . insulin aspart  0-15 Units Subcutaneous Q6H  . ipratropium-albuterol  3 mL Nebulization TID  . mouth rinse  15 mL Mouth Rinse q12n4p  . melatonin  6 mg Per Tube QHS  . montelukast  10 mg Per Tube QHS  . nutrition supplement (JUVEN)  1 packet Per Tube BID BM  . pantoprazole sodium  40 mg Per Tube BID  . polycarbophil  625 mg Oral Daily  . psyllium  1 packet Oral TID  . sodium chloride flush  10-40 mL Intracatheter Q12H  . spironolactone  25 mg Oral Daily  . traZODone  100 mg Per Tube QHS    Infusions: . sodium chloride Stopped (10/13/19 0548)  . feeding supplement  (OSMOLITE 1.5 CAL) 1,000 mL (11/01/19 1359)    PRN Medications: sodium chloride, acetaminophen, albuterol, calcium carbonate, [COMPLETED] diazepam **FOLLOWED BY** diazepam, hydrALAZINE, HYDROmorphone, loperamide HCl, ondansetron (ZOFRAN) IV, sodium chloride, sodium chloride flush   Assessment/Plan:    1. Acute hypoxic/hypercarbic respiratory failure due to COVID PNA: Remained markedly hypoxic despite full support. Intubated 12/26. S/p bilateral CTs 12/26 for pneumothoraces.  TEE on 12/26 LVEF 70% RV ok. VV ECMO begun 12/26. Tracheostomy 12/29.  COVID ARDS at this point. ECMO circuit changed 1/8 & 1/23. Crescent cannula placed 2/2 (femoral cannuals removed) - Decannulated 3/4 - Still w/ trach collar but currently on [place]. O2 sats stable.  - PCCM following and have downsized trach. Feel that ongoing weakness is biggest barrier to decannulation. Continue trach trials  - Continue PT/OT.  - No change today  2. COVID PNA: management of COVID infection per CCM. CXR with bilateral multifocal PNA progressing to likely ARDS.  - s/p 10 days of remdesivir - 12/16, 12/2 s/p tociluzimab - 12/17 s/p convalescent plasma - Decadron completed 1/11 - Resolved  3. FEN - Remains on TFs. Loose stools improved with change in regimen - Now on D3 diet. Had coughing fit and emesis after eating last night. Denies overt reflux symptoms  - Will start PPI   4. Hypernatremia -Na stable  at 143  -Continue free water will decrease to 150 q12  5. DTIs - On both feet.  Improving  6.  Hypokalemia. - K 3.9  7.  Diffuse muscle weakness. -This is consistent with ICU/steroid/paralytics myopathy.  Continue passive range of motion exercises with PT.   -PT and OT are following.  Both have recommended CIR.  CIR following closley and feels he is not ready yet. Continue to push w/ acute-side PT/OT to maximize strength, bed mobility, OOB to chair. - continue OT/PT. Appreciate all the assistance  8. HTN - BP improved  with increased carvedilol   10. Hypomag - has been suppe  11. Urinary retention - Continue Flomax - reduce anti-cholinergics (offseroquel)  - follow PVRs  Length of Stay: 103  Dailin Sosnowski MD 11/02/2019, 11:09 AM  Advanced Heart Failure Team Pager [phone] (M-F; 7a - 4p)  Please contact [facility] for night-coverage after hours (4p -7a ) and weekends on [email or web address]

## 2019-11-02 NOTE — Progress Notes (Signed)
NAME:  [name], MRN:  [id number], DOB:  Sep 20, 1975, LOS: 4 ADMISSION DATE:  07/22/2019, CONSULTATION DATE:  12/24 REFERRING MD:  Sloan Leiter, CHIEF COMPLAINT:  Dyspnea   Brief History   44 y/o male admitted 12/14 with COVID pneumonia leading to ARDS, pneumomediastinum.  Intubated 12/26, bilateral chest tubes placed.  Past Medical History  GERD HTN Asthma  Significant Hospital Events   12/14 admit with hypoxemic respiratory failure in setting of COVID-19 pneumonia 12/23 noted to have pneumomediastinum on chest x-ray 12/24PCCM consulted for evaluation 12/26 worsening hypoxemia, intubated 12/27 VV fem/fem ECMO cannulation 12/29 tracheostomy >> See bedside nursing event notes for full rundown of procedures after 12/27 1/5 epistaxis, nasal packing 1/30 improved cxr, improved lung compliance, TV 350s on 15PC  1/31 CXR increased infiltrates, +CFB     2/2 Converted to right IJ Crescent cannula. 2/3 PEG tube inserted at bedside 2/14 30 min sweep trial> paO2 105 (18 PEEP, 60% FiO2) pH 7.22, pCO2 87 2/20 2 hours sweep trial 2/21 1.5 hour sweep trial 2/24 7-hour sweep trial.  Increasingly awake. 3/2 - decannulation attempt aborted due to increased hypercapnia when sedated for procedure 3/4- decannulated 3/8 PEG tube repositioned by IR 3/9 > trach wean 3/14 started 24/7 TC 3/23 starting capping trials 3/24 changed to 5 prox xlt cuffless Consults:  PCCM, CTS, CHF, Palliative  Procedures:  PICC 1/3 >> R radial a-line 1/20 > 3/5 R IJ ECMO cath 2/3 > 3/4  Tracheostomy 12/29 >> 10/14/2019 PEG tube replacement>>  Significant Diagnostic Tests:  CT chest 12/22>extensive pneumomediastinum, multifocal patchy bilateral groundglass opacities CXR 1/25 > improving bilateral infiltrates. ECMO cannulae in place. CXR 2/5 > clearing bilateral infiltrates  Micro Data:  BCx2 12/14 >negative  Sputum 12/15 > OPF 12/28 blood > negative 12/29 > staph aureus 1/4 bronch:  1/2 acid fast  smear bronch: negative 1/2 fungal cx bronch: negative1/12 BAL: serratia, staph epidermidis 1/4 BAL > MSSA, Serratia 1/9 blood > negative 1/9 Resp culture > MSSA, serratia 1/11 serratia > negative 1/12 fungus culture > negative 1/12 BAL > serratia 1/18 BAL >> rare Serratia. 1/24 blood > negative 1/30 blood > negative 1/30 resp culture > serratia 2/1 BAL > rare Serratia - enterococcus 2/1 aspergillus Ag > negative 2/2 resp culture > serratia  2/2 blood > negative 2/5 urine > negative 2/9 resp: serratia 2/15 BAL serratia 3/5 blood cultures ngtd  Antimicrobials:  Received remdesivir, steroids, actemra and convalescent plasma  See MAR for remainder  Interim history/subjective:  No acute changes Objective   Blood pressure 135/76, pulse 94, temperature 98.4 F (36.9 C), temperature source Oral, resp. rate (!) 22, height _0  (1.727 m), weight 118.5 kg, SpO2 97 %.    FiO2 (%):  [98 %] 98 %   Intake/Output Summary (Last 24 hours) at 11/02/2019 1758 Last data filed at 11/02/2019 1600 Gross per 24 hour  Intake 2610 ml  Output 1250 ml  Net 1360 ml   Filed Weights   10/31/19 0400 11/01/19 0500 11/02/19 0630  Weight: 118 kg 118.8 kg 118.5 kg    Examination: General 44 year old white male resting in bed he is in no acute distress this morning HEENT normocephalic atraumatic orally intubated size 5 proximal cuffless XLT in place red capping valve in place excellent phonation Pulmonary: No accessory use today, mild left-sided rhonchi Cardiac regular rate and rhythm without murmur rub or gallop Abdomen is soft nontender Extremities are warm and dry Neuro awake, oriented, but still diffusely weak. Was able to weakly  grasp suction catheter.  Resolved Hospital Problem list    ARDS due to COVID 19 Decannulated from VV ECMO on 3/4 after 65 days  Pneumothoraces-resolved. Dislodged PEG tube Indurated area of the skin right lateral PEG tube. Serratia pneumonia> treated.   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  He remains quite weak, this is his primary barrier to decannulation, which time I expect will be able to take the trach out  Plan Continue current routine tracheostomy care Continue capping with continuous pulse oximetry, eventually will get him decannulated but need him stronger  Biggest focus at this point needs to remain on his physical rehabilitation    Kipp Brood, MD [facility] ICU Physician [place]  Pager: [phone] Mobile: [phone] After hours: [phone].  11/02/2019, 5:59 PM

## 2019-11-02 NOTE — Progress Notes (Signed)
  Speech Language Pathology Treatment: Dysphagia  Patient Details Name: [name] MRN: [id number] DOB: 05-Nov-1975 Today's Date: 11/02/2019 Time: 2957-4734 SLP Time Calculation (min) (ACUTE ONLY): 14 min  Assessment / Plan / Recommendation Clinical Impression  Pt was seen for skilled ST targeting dysphagia and diet tolerance.  RN reported that pt had episodes of emesis after dinner last night and this morning (he did not eat breakfast).  Pt was encountered awake/alert with a moderate amount of secretions in his oral cavity that he requested be suctioned.  He reported that he tolerated his dinner well last night, but stated that he began coughing afterwards and eventually had emesis.  Suspect an esophageal etiology.  Pt may benefit from a GI consult to further evaluate.  Pt was agreeable to minimal trials of thin liquid via straw sip and soft solids.  Mastication was effective and AP transport appeared timely with soft solid trials.  Pt independently took small bites/sips and no overt s/sx of aspiration were observed with any trials.  Recommend continuation of Dysphagia 3 (soft) solids and thin liquids with medications administered whole with liquid or puree (per pt preference).     HPI HPI: Pt is a 44 y/o male admitted 12/14 with COVID pneumonia leading to ARDS and needing mechanical ventilation, pneumomediastinum. He was intubated 12/26-12/29 with trach placement on latter date. PEG tube done on 09/11/19 and replaced on 10/14/19 after dislodging. Decannulated from VV ECMO on 3/4 after 65 days. SLP was consulted for possible PMSV placement. Pt downsized to #6 distal cuffless on 10/22/19. Capping trials 3/23. FEES 3/19 with pharyngoesophageal dysphagia noted and backflow of POs from esophagus aspirated; recs were to remain NPO. MBS 3/26.21: mild pharyngeal phase dysphagia characterized by reduced lingual retraction and reduced anterior laryngeal movement. Dysphagia 3 with thin recommended.      SLP Plan  Continue with current plan of care       Recommendations  Diet recommendations: Dysphagia 3 (mechanical soft);Thin liquid Liquids provided via: Cup;Straw Medication Administration: Whole meds with liquid Supervision: Trained caregiver to feed patient;Staff to assist with self feeding Compensations: Slow rate;Other (Comment)(small single sips; small meals ) Postural Changes and/or Swallow Maneuvers: Seated upright 90 degrees;Upright 30-60 min after meal                Oral Care Recommendations: Oral care BID Follow up Recommendations: Inpatient Rehab SLP Visit Diagnosis: Dysphagia, pharyngeal phase (R13.13) Plan: Continue with current plan of care       [place]., M.S., [place] Office: [phone]  [place] 11/02/2019, 9:03 AM

## 2019-11-03 LAB — CBC
HCT: 38.1 % — ABNORMAL LOW (ref 39.0–52.0)
Hemoglobin: 11.5 g/dL — ABNORMAL LOW (ref 13.0–17.0)
MCH: 28 pg (ref 26.0–34.0)
MCHC: 30.2 g/dL (ref 30.0–36.0)
MCV: 92.7 fL (ref 80.0–100.0)
Platelets: 113 10*3/uL — ABNORMAL LOW (ref 150–400)
RBC: 4.11 MIL/uL — ABNORMAL LOW (ref 4.22–5.81)
RDW: 16.4 % — ABNORMAL HIGH (ref 11.5–15.5)
WBC: 6.1 10*3/uL (ref 4.0–10.5)
nRBC: 0 % (ref 0.0–0.2)

## 2019-11-03 LAB — BASIC METABOLIC PANEL
Anion gap: 10 (ref 5–15)
BUN: 41 mg/dL — ABNORMAL HIGH (ref 6–20)
CO2: 30 mmol/L (ref 22–32)
Calcium: 9.6 mg/dL (ref 8.9–10.3)
Chloride: 100 mmol/L (ref 98–111)
Creatinine, Ser: 0.54 mg/dL — ABNORMAL LOW (ref 0.61–1.24)
GFR calc Af Amer: >60 mL/min (ref >60)
GFR calc non Af Amer: >60 mL/min (ref >60)
Glucose, Bld: 99 mg/dL (ref 70–99)
Potassium: 4.2 mmol/L (ref 3.5–5.1)
Sodium: 140 mmol/L (ref 135–145)

## 2019-11-03 LAB — GLUCOSE, CAPILLARY
Glucose-Capillary: 126 mg/dL — ABNORMAL HIGH (ref 70–99)
Glucose-Capillary: 124 mg/dL — ABNORMAL HIGH (ref 70–99)
Glucose-Capillary: 110 mg/dL — ABNORMAL HIGH (ref 70–99)

## 2019-11-03 LAB — MAGNESIUM: Magnesium: 1.7 mg/dL (ref 1.7–2.4)

## 2019-11-03 LAB — PHOSPHORUS: Phosphorus: 4.8 mg/dL — ABNORMAL HIGH (ref 2.5–4.6)

## 2019-11-03 MED ORDER — MAGNESIUM SULFATE 2 GM/50ML IV SOLN
2.0000 g | Freq: Once | INTRAVENOUS | Status: AC
  Administered 2019-11-03: 2 g via INTRAVENOUS
  Filled 2019-11-03: qty 50

## 2019-11-03 NOTE — Plan of Care (Signed)
  Problem: Education: Goal: Knowledge of risk factors and measures for prevention of condition will improve Outcome: Progressing   Problem: Coping: Goal: Psychosocial and spiritual needs will be supported Outcome: Progressing   Problem: Respiratory: Goal: Complications related to the disease process, condition or treatment will be avoided or minimized Outcome: Progressing   Problem: Health Behavior/Discharge Planning: Goal: Ability to manage health-related needs will improve Outcome: Progressing   Problem: Clinical Measurements: Goal: Will remain free from infection Outcome: Progressing Goal: Respiratory complications will improve Outcome: Progressing   Problem: Coping: Goal: Level of anxiety will decrease Outcome: Progressing   Problem: Elimination: Goal: Will not experience complications related to bowel motility Outcome: Progressing   Problem: Pain Managment: Goal: General experience of comfort will improve Outcome: Progressing   Problem: Safety: Goal: Ability to remain free from injury will improve Outcome: Progressing

## 2019-11-03 NOTE — Progress Notes (Signed)
NAME:  [name], MRN:  [id number], DOB:  11-Feb-1976, LOS: 16 ADMISSION DATE:  07/22/2019, CONSULTATION DATE:  12/24 REFERRING MD:  Sloan Leiter, CHIEF COMPLAINT:  Dyspnea   Brief History   44 y/o male admitted 12/14 with COVID pneumonia leading to ARDS, pneumomediastinum.  Intubated 12/26, bilateral chest tubes placed.  Past Medical History  GERD HTN Asthma  Significant Hospital Events   12/14 admit with hypoxemic respiratory failure in setting of COVID-19 pneumonia 12/23 noted to have pneumomediastinum on chest x-ray 12/24PCCM consulted for evaluation 12/26 worsening hypoxemia, intubated 12/27 VV fem/fem ECMO cannulation 12/29 tracheostomy >> See bedside nursing event notes for full rundown of procedures after 12/27 1/5 epistaxis, nasal packing 1/30 improved cxr, improved lung compliance, TV 350s on 15PC  1/31 CXR increased infiltrates, +CFB     2/2 Converted to right IJ Crescent cannula. 2/3 PEG tube inserted at bedside 2/14 30 min sweep trial> paO2 105 (18 PEEP, 60% FiO2) pH 7.22, pCO2 87 2/20 2 hours sweep trial 2/21 1.5 hour sweep trial 2/24 7-hour sweep trial.  Increasingly awake. 3/2 - decannulation attempt aborted due to increased hypercapnia when sedated for procedure 3/4- decannulated 3/8 PEG tube repositioned by IR 3/9 > trach wean 3/14 started 24/7 TC 3/23 starting capping trials 3/24 changed to 5 prox xlt cuffless Consults:  PCCM, CTS, CHF, Palliative  Procedures:  PICC 1/3 >> R radial a-line 1/20 > 3/5 R IJ ECMO cath 2/3 > 3/4  Tracheostomy 12/29 >> 10/14/2019 PEG tube replacement>>  Significant Diagnostic Tests:  CT chest 12/22>extensive pneumomediastinum, multifocal patchy bilateral groundglass opacities CXR 1/25 > improving bilateral infiltrates. ECMO cannulae in place. CXR 2/5 > clearing bilateral infiltrates  Micro Data:  BCx2 12/14 >negative  Sputum 12/15 > OPF 12/28 blood > negative 12/29 > staph aureus 1/4 bronch:  1/2 acid fast  smear bronch: negative 1/2 fungal cx bronch: negative1/12 BAL: serratia, staph epidermidis 1/4 BAL > MSSA, Serratia 1/9 blood > negative 1/9 Resp culture > MSSA, serratia 1/11 serratia > negative 1/12 fungus culture > negative 1/12 BAL > serratia 1/18 BAL >> rare Serratia. 1/24 blood > negative 1/30 blood > negative 1/30 resp culture > serratia 2/1 BAL > rare Serratia - enterococcus 2/1 aspergillus Ag > negative 2/2 resp culture > serratia  2/2 blood > negative 2/5 urine > negative 2/9 resp: serratia 2/15 BAL serratia 3/5 blood cultures ngtd  Antimicrobials:  Received remdesivir, steroids, actemra and convalescent plasma  See MAR for remainder  Interim history/subjective:  Is tolerating small amounts of oral intake.  Stooling normally.  Altered taste perception.  Objective   Blood pressure (!) 141/78, pulse 96, temperature 98.3 F (36.8 C), resp. rate 18, height _0  (1.727 m), weight 112.4 kg, SpO2 94 %.    FiO2 (%):  [98 %] 98 %   Intake/Output Summary (Last 24 hours) at 11/03/2019 1657 Last data filed at 11/03/2019 1500 Gross per 24 hour  Intake 2512.3 ml  Output 2100 ml  Net 412.3 ml   Filed Weights   11/01/19 0500 11/02/19 0630 11/03/19 0545  Weight: 118.8 kg 118.5 kg 112.4 kg    Examination: General 44 year old white male resting in bed he is in no acute distress this morning HEENT normocephalic atraumatic orally intubated size 5 proximal cuffless XLT in place red capping valve in place excellent phonation Pulmonary: No accessory use today, mild left-sided rhonchi Cardiac regular rate and rhythm without murmur rub or gallop Abdomen is soft nontender Extremities are warm and dry Neuro awake,  oriented, but still diffusely weak. Was able to weakly grasp suction catheter.  Resolved Hospital Problem list    ARDS due to COVID 19 Decannulated from VV ECMO on 3/4 after 65 days  Pneumothoraces-resolved. Dislodged PEG tube Indurated area of the skin right  lateral PEG tube. Serratia pneumonia> treated.  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  He remains quite weak, this is his primary barrier to decannulation, which time I expect will be able to take the trach out. We will discuss transitioning to intermittent feeds to encourage appetite with dietitian.  Plan Continue current routine tracheostomy care Continue capping with continuous pulse oximetry, eventually will get him decannulated but need him stronger  Biggest focus at this point needs to remain on his physical rehabilitation    Kipp Brood, MD [name] ICU Physician [place]  Pager: [phone] Mobile: [phone] After hours: [phone].  11/03/2019, 4:57 PM

## 2019-11-03 NOTE — Plan of Care (Signed)
  Problem: Education: Goal: Knowledge of risk factors and measures for prevention of condition will improve Outcome: Progressing   Problem: Coping: Goal: Psychosocial and spiritual needs will be supported Outcome: Progressing   Problem: Respiratory: Goal: Complications related to the disease process, condition or treatment will be avoided or minimized Outcome: Progressing   Problem: Respiratory: Goal: Ability to maintain a clear airway and adequate ventilation will improve Outcome: Progressing   Problem: Health Behavior/Discharge Planning: Goal: Ability to manage health-related needs will improve Outcome: Progressing   Problem: Clinical Measurements: Goal: Will remain free from infection Outcome: Progressing Goal: Respiratory complications will improve Outcome: Progressing   Problem: Activity: Goal: Risk for activity intolerance will decrease Outcome: Progressing   Problem: Coping: Goal: Level of anxiety will decrease Outcome: Progressing   Problem: Elimination: Goal: Will not experience complications related to bowel motility Outcome: Progressing   Problem: Pain Managment: Goal: General experience of comfort will improve Outcome: Progressing   Problem: Safety: Goal: Ability to remain free from injury will improve Outcome: Progressing   Problem: Skin Integrity: Goal: Risk for impaired skin integrity will decrease Outcome: Progressing

## 2019-11-03 NOTE — Plan of Care (Signed)
Stable during shift. Alert and interactive, good affect. Slept well (5 hr) after scheduled melatonin, klonopin and trazodone.   Diarrhea x 2, gave imodium. Voiding well, able to call out for urinal, > 2 L output for 24 hr.   SR, BP stable, slightly elevated but no hydralazine required. Sats stable on 2 L n/c, trach remains capped.   Tolerating bed in standing position x 20 minutes. Turning q 2 hr for comfort and skin integrity. ROM exercises done several times during shift.   Tube feeds continue, taking small amount of po, poor appetite/taste loss but follows dysphagia instructions, no coughing or nausea/emesis.   Afebrile, labs pending.   Problem: Education: Goal: Knowledge of risk factors and measures for prevention of condition will improve 11/03/2019 0546 by Clerance Lav, RN Outcome: Progressing 11/03/2019 0217 by Clerance Lav, RN Outcome: Progressing   Problem: Coping: Goal: Psychosocial and spiritual needs will be supported 11/03/2019 0546 by Clerance Lav, RN Outcome: Progressing 11/03/2019 0217 by Clerance Lav, RN Outcome: Progressing   Problem: Respiratory: Goal: Complications related to the disease process, condition or treatment will be avoided or minimized 11/03/2019 0546 by Clerance Lav, RN Outcome: Progressing 11/03/2019 0217 by Clerance Lav, RN Outcome: Progressing   Problem: Health Behavior/Discharge Planning: Goal: Ability to manage health-related needs will improve 11/03/2019 0546 by Clerance Lav, RN Outcome: Progressing 11/03/2019 0217 by Clerance Lav, RN Outcome: Progressing   Problem: Clinical Measurements: Goal: Will remain free from infection 11/03/2019 0546 by Clerance Lav, RN Outcome: Progressing 11/03/2019 0217 by Clerance Lav, RN Outcome: Progressing Goal: Respiratory complications will improve Outcome: Progressing   Problem: Activity: Goal: Risk for activity intolerance will decrease Outcome: Progressing    Problem: Coping: Goal: Level of anxiety will decrease 11/03/2019 0546 by Clerance Lav, RN Outcome: Progressing 11/03/2019 0217 by Clerance Lav, RN Outcome: Progressing   Problem: Pain Managment: Goal: General experience of comfort will improve 11/03/2019 0546 by Clerance Lav, RN Outcome: Progressing 11/03/2019 0217 by Clerance Lav, RN Outcome: Progressing   Problem: Safety: Goal: Ability to remain free from injury will improve 11/03/2019 0546 by Clerance Lav, RN Outcome: Progressing 11/03/2019 0217 by Clerance Lav, RN Outcome: Progressing   Problem: Skin Integrity: Goal: Risk for impaired skin integrity will decrease Outcome: Progressing

## 2019-11-03 NOTE — Progress Notes (Signed)
Patient ID: [name], male   DOB: 07/08/1976, 44 y.o.   MRN: [id number]    Advanced Heart Failure Rounding Note   Subjective:    Remains [name] continues to do his exercise. Mild GERD.   Denies SOB. Occasional incontinence.   Objective:   Weight Range:  Vital Signs:   Temp:  [98.2 F (36.8 C)-99.1 F (37.3 C)] 99.1 F (37.3 C) (03/28 0810) Pulse Rate:  [79-107] 91 (03/28 1108) Resp:  [17-26] 20 (03/28 1108) BP: (108-159)/(53-91) 108/53 (03/28 1000) SpO2:  [91 %-99 %] 96 % (03/28 1108) FiO2 (%):  [98 %] 98 % (03/28 0108) Weight:  [112.4 kg] 112.4 kg (03/28 0545) Last BM Date: 11/03/19  Weight change: Filed Weights   11/01/19 0500 11/02/19 0630 11/03/19 0545  Weight: 118.8 kg 118.5 kg 112.4 kg    Intake/Output:   Intake/Output Summary (Last 24 hours) at 11/03/2019 1215 Last data filed at 11/03/2019 0902 Gross per 24 hour  Intake 2742.33 ml  Output 2075 ml  Net 667.33 ml     PHYSICAL EXAM: General:  Obese WM, lying in bed. No resp difficulty HEENT: normal Neck: supple. no JVD. + [name] Carotids 2+ bilat; no bruits. No lymphadenopathy or thryomegaly appreciated. Cor: PMI nondisplaced. Regular rate & rhythm. No rubs, gallops or murmurs. Lungs: clear Abdomen: obese  soft, nontender, nondistended. No hepatosplenomegaly. No bruits or masses. Good bowel sounds. + PEG Extremities: no cyanosis, clubbing, rash, edema Neuro: alert & orientedx3, cranial nerves grossly intact. moves all 4 extremities w/o difficulty. Affect pleasant   Telemetry: sinus 80-90s Personally reviewed   Labs: Basic Metabolic Panel: Recent Labs  Lab 10/27/19 1635 10/28/19 0404 10/28/19 0404 10/30/19 0326 11/01/19 0225 11/03/19 0509  NA 146* 145  --  145 143 140  K 4.1 3.8  --  3.9 3.9 4.2  CL  --  101  --  101 101 100  CO2  --  33*  --  34* 33* 30  GLUCOSE  --  94  --  141* 115* 99  BUN  --  39*  --  35* 43* 41*  CREATININE  --  0.51*  --  0.62 0.53* 0.54*  CALCIUM  --  9.3   < > 9.7  9.3 9.6  MG  --  1.9  --  1.7 1.8 1.7  PHOS  --  4.4  --  4.8* 4.7* 4.8*   < > = values in this interval not displayed.    Liver Function Tests: No results for input(s): AST, ALT, ALKPHOS, BILITOT, PROT, ALBUMIN in the last 168 hours. No results for input(s): LIPASE, AMYLASE in the last 168 hours. No results for input(s): AMMONIA in the last 168 hours.  CBC: Recent Labs  Lab 10/27/19 1635 10/28/19 0404 10/30/19 0326 11/01/19 0225 11/03/19 0509  WBC  --  6.3 7.7 6.7 6.1  HGB 12.9* 10.9* 11.8* 11.2* 11.5*  HCT 38.0* 37.4* 39.8 37.5* 38.1*  MCV  --  95.2 93.4 94.2 92.7  PLT  --  186 159 125* 113*    Cardiac Enzymes: No results for input(s): CKTOTAL, CKMB, CKMBINDEX, TROPONINI in the last 168 hours.  BNP: BNP (last 3 results) Recent Labs    07/22/19 1039  BNP 15.3    ProBNP (last 3 results) No results for input(s): PROBNP in the last 8760 hours.    Other results:  Imaging: No results found.   Medications:     Scheduled Medications: . amLODipine  10 mg Per Tube Daily  .  vitamin C  500 mg Per Tube Daily  . carvedilol  25 mg Per Tube BID WC  . chlorhexidine  15 mL Mouth Rinse BID  . Chlorhexidine Gluconate Cloth  6 each Topical Daily  . clonazepam  1 mg Per Tube QHS  . doxazosin  1 mg Per Tube QHS  . enoxaparin (LOVENOX) injection  60 mg Subcutaneous Q24H  . escitalopram  20 mg Per Tube Daily  . feeding supplement (PRO-STAT SUGAR FREE 64)  60 mL Per Tube TID  . free water  150 mL Per Tube q12n4p  . Gerhardt's butt cream   Topical Daily  . insulin aspart  0-15 Units Subcutaneous Q6H  . ipratropium-albuterol  3 mL Nebulization BID  . mouth rinse  15 mL Mouth Rinse q12n4p  . melatonin  6 mg Per Tube QHS  . montelukast  10 mg Per Tube QHS  . nutrition supplement (JUVEN)  1 packet Per Tube BID BM  . pantoprazole sodium  40 mg Per Tube BID  . polycarbophil  625 mg Oral Daily  . psyllium  1 packet Oral TID  . sodium chloride flush  10-40 mL Intracatheter  Q12H  . spironolactone  25 mg Oral Daily  . traZODone  100 mg Per Tube QHS    Infusions: . sodium chloride Stopped (10/13/19 0548)  . feeding supplement (OSMOLITE 1.5 CAL) 1,000 mL (11/01/19 1359)    PRN Medications: sodium chloride, acetaminophen, albuterol, calcium carbonate, [COMPLETED] diazepam **FOLLOWED BY** diazepam, hydrALAZINE, HYDROmorphone, loperamide HCl, ondansetron (ZOFRAN) IV, sodium chloride, sodium chloride flush   Assessment/Plan:    1. Acute hypoxic/hypercarbic respiratory failure due to COVID PNA: Remained markedly hypoxic despite full support. Intubated 12/26. S/p bilateral CTs 12/26 for pneumothoraces.  TEE on 12/26 LVEF 70% RV ok. VV ECMO begun 12/26. Tracheostomy 12/29.  COVID ARDS at this point. ECMO circuit changed 1/8 & 1/23. Crescent cannula placed 2/2 (femoral cannuals removed) - Decannulated 3/4 - Still w/ trach collar but currently on [place]. O2 sats stable.  - PCCM following and have downsized trach. Feel that ongoing weakness is biggest barrier to decannulation. Continue trach trials  - Continue PT/OT.  - Stable no change today.   2. COVID PNA: management of COVID infection per CCM. CXR with bilateral multifocal PNA progressing to likely ARDS.  - s/p 10 days of remdesivir - 12/16, 12/2 s/p tociluzimab - 12/17 s/p convalescent plasma - Decadron completed 1/11 - Resolved  3. FEN - Remains on TFs. Loose stools improved with change in regimen - Now on D3 diet. Had coughing fit and emesis after eating last night. Denies overt reflux symptoms  - Continue PPI. Can add carafate as needed   4. Hypernatremia -Na stable  at 140 -I decreased FW to 150 q12 on 3/27. Can continue to drop as needed  5. DTIs - On both feet.  Improving  6.  Hypokalemia. - K 4.2 today   7.  Diffuse muscle weakness. -This is consistent with ICU/steroid/paralytics myopathy.  Continue passive range of motion exercises with PT.   -PT and OT are following.  Both have recommended  CIR.  CIR following [place] and feels he is not ready yet. Continue to push w/ acute-side PT/OT to maximize strength, bed mobility, OOB to chair. - continue OT/PT. Appreciate all the assistance - I did some of his ROM exercises with him today and he continues to improved. Weaker on left than right. Shoulders with reduced ROM  8. HTN - Blood pressure well controlled. Continue current  regimen.  10. Hypomag - mag 1.7 will supp again   11. Urinary retention - Continue Flomax - reduced anti-cholinergics (off seroquel)  - follow PVRs  Length of Stay: 104  Clytie Shetley MD 11/03/2019, 12:15 PM  Advanced Heart Failure Team Pager [phone] (M-F; [place])  Please contact [facility] for night-coverage after hours (4p -7a ) and weekends on [email or web address]

## 2019-11-03 NOTE — Progress Notes (Signed)
Pt resting in chair talking with wife. PT had 31mn 30 sec of VT HR upper 120's-130's. Pt now NSR HR 80-90's. Will continue to monitor.

## 2019-11-04 LAB — GLUCOSE, CAPILLARY
Glucose-Capillary: 120 mg/dL — ABNORMAL HIGH (ref 70–99)
Glucose-Capillary: 98 mg/dL (ref 70–99)
Glucose-Capillary: 126 mg/dL — ABNORMAL HIGH (ref 70–99)

## 2019-11-04 MED ORDER — PRO-STAT SUGAR FREE PO LIQD
60.0000 mL | Freq: Four times a day (QID) | ORAL | Status: DC
  Administered 2019-11-04 – 2019-11-14 (×40): 60 mL
  Filled 2019-11-04 (×38): qty 60

## 2019-11-04 MED ORDER — ENSURE ENLIVE PO LIQD
237.0000 mL | Freq: Two times a day (BID) | ORAL | Status: DC
  Administered 2019-11-04 – 2019-11-11 (×8): 237 mL via ORAL

## 2019-11-04 MED ORDER — IPRATROPIUM-ALBUTEROL 0.5-2.5 (3) MG/3ML IN SOLN
3.0000 mL | RESPIRATORY_TRACT | Status: DC | PRN
  Administered 2019-11-05 (×2): 3 mL via RESPIRATORY_TRACT
  Filled 2019-11-04 (×2): qty 3

## 2019-11-04 MED ORDER — OSMOLITE 1.5 CAL PO LIQD
1000.0000 mL | ORAL | Status: DC
  Administered 2019-11-04 – 2019-11-10 (×6): 1000 mL
  Filled 2019-11-04 (×8): qty 1000

## 2019-11-04 NOTE — Progress Notes (Addendum)
  Speech Language Pathology Treatment: Dysphagia  Patient Details Name: [name] MRN: [id number] DOB: 1975/11/27 Today's Date: 11/04/2019 Time: 7673-4193 SLP Time Calculation (min) (ACUTE ONLY): 18 min  Assessment / Plan / Recommendation Clinical Impression  Pt was seen for dysphagia treatment and reported that he has been tolerating the meals without difficulty but has not had much of an appetite since he cannot taste the food. He denied any symptoms of pharyngeal dysphagia and denied any episodes of emesis today. Per the pt he has been completing dysphagia exercises and was able to demonstrate the Mendelsohn maneuver with min cues during the session while maintaining hyolaryngeal elevation for 7-10 seconds. Pt tolerated thin liquids via straw and trials of regular texture solids without overt s/sx of aspiration. Mastication time was [facility] and no significant oral residue was noted. Pt's diet may be upgraded at this time. However, pt reported that he is still somewhat fearful of eating since he was NPO for so long and therefore requested that a diet upgrade be deferred. SLP will be continue to follow pt.    HPI HPI: Pt is a 44 y/o male admitted 12/14 with COVID pneumonia leading to ARDS and needing mechanical ventilation, pneumomediastinum. He was intubated 12/26-12/29 with trach placement on latter date. PEG tube done on 09/11/19 and replaced on 10/14/19 after dislodging. Decannulated from VV ECMO on 3/4 after 65 days. SLP was consulted for possible PMSV placement. Pt downsized to #6 distal cuffless on 10/22/19. Capping trials 3/23. FEES 3/19 with pharyngoesophageal dysphagia noted and backflow of POs from esophagus aspirated; recs were to remain NPO. MBS 3/26.21: mild pharyngeal phase dysphagia characterized by reduced lingual retraction and reduced anterior laryngeal movement. Dysphagia 3 with thin recommended.      SLP Plan  Continue with current plan of care       Recommendations  Diet  recommendations: Dysphagia 3 (mechanical soft);Thin liquid Liquids provided via: Cup;Straw Medication Administration: Whole meds with liquid Supervision: Trained caregiver to feed patient;Staff to assist with self feeding Compensations: Slow rate;Other (Comment)(small single sips; small meals ) Postural Changes and/or Swallow Maneuvers: Seated upright 90 degrees;Upright 30-60 min after meal                Oral Care Recommendations: Oral care BID Follow up Recommendations: Inpatient Rehab SLP Visit Diagnosis: Dysphagia, pharyngeal phase (R13.13) Plan: Continue with current plan of care       [name] I. Hardin Negus, [place], [place] Office number [phone] Pager [place] 11/04/2019, 5:50 PM

## 2019-11-04 NOTE — Consult Note (Addendum)
[place] Nurse wound follow up Patient receiving care at [facility] type:Eschar to bilateral feet  Measurement: right: 18.0  cm x 6.0 cm dry blood blister (eschar) heel of the right foot has new skin growing over the eschar measuring 1.0 x 1.0 cm, nurse utilized iodine and thumb pressure to remove the new layer of skin and expose the top layer of eschar, pt. Tolerated this very well.  Bedside nurse should continue to do this if they assess new skin growth over the eschar.   Left: 15.0 cm x 6.0 cm eschar appears dry and beginning to separate in areas. Wound POE:UMPNTI dry eschar from blood blister resolving Drainage (amount, consistency, odor) dry and intact  None noted today.  Periwound:intact Dressing procedure/placement/frequency: Continue betadine and foam dressing.  Will assess every 7-10 days.  Austin Miles, RN, WTA-C Wound, Ostomy, Continence Nurse Pager [phone]

## 2019-11-04 NOTE — Progress Notes (Signed)
Nutrition Follow up  DOCUMENTATION CODES:   Obesity unspecified  INTERVENTION:   -Add Ensure Enlive po BID, each supplement provides 350 kcal and 20 grams of protein -Add Hormel Shake once daily between meals, each supplement provides 520 kcals and 22 grams of protein  Transition to nocturnal tube feeding:  -Osmolite 1.5 @ 75 ml/hr via PEG x 14 hrs (1800-0800) -60 ml Prostat QID -Free water flushes 150 ml BID -Juven BID   At goal TF provides: 2375 kcal, 191 grams protein, and 800 ml free water (1100 ml with flushes). Meets 78% kcal needs and 100% of protein needs.   NUTRITION DIAGNOSIS:   Increased nutrient needs related to acute illness(COVID PNA) as evidenced by estimated needs.  Ongoing  GOAL:   Patient will meet greater than or equal to 90% of their needs   Addressed via TF  MONITOR:   Diet advancement, Skin, TF tolerance, Weight trends, Labs, I & O's  REASON FOR ASSESSMENT:   Consult Enteral/tube feeding initiation and management  ASSESSMENT:   Patient with PMH significant for GERD, HTN, and asthma. Presents this admission with COVID-19 PNA.   12/14- admit 12/23- chest xray revealed pneumomediastinum  12/24- developed bilateral pneumothoraces  12/26- worsening hypoxemia, intubated  12/27- s/p VV fem/fem ECMO cannulation  12/29- s/p trach  1/5- nasal bleeding- packing per CCM 1/7- nasal packing per ENT 1/8- trach exchanged, ECMO circuit changed  1/20- s/p additional RIJ drainage cannula  1/21- Cortrak kinked, replaced by OGT, pt vomited over night 1/22- ECMO circuit exchanged  1/23- nasal endoscopy, control of epistaxis  2/2- TEE, Cortrak removed, change to crescent catheter, removal of femoral lines 2/3- s/p PEG, EGD 2/15- developed recurrent PNA/sepsis  3/2- decannulation cancelled 3/4- s/p decannulation VV ECMO 3/7- PEG dislodged  3/8- s/p PEG replacement by IR   Pt discussed during ICU rounds and with RN.   Diet advanced to DYS 3 with thin  liquids. Nursing assisting with meals as pt lacks mobility in arms. Meal completions charted as 5-15% since advancement. Pt reports feeling hungry but taste changes prevent him from eating more. States he is able to taste certain beverages (unsweet tea) and fruit but not meats.   Discussed transitioning to nocturnal feedings to help promote appetite. Pt in agreement. Will decrease rate to meet ~75% of kcal needs. If intake does not progress, TF may need to be increased. Pt willing to try supplementation.   Admission weight: 136.1 kg  Current weight: 119 kg   I/O: +9,731 ml since 3/15 UOP: 1,800 ml x 24 hrs   Medications: 500 mg Vitamin C, SS novolog, fibercon, metamucil, aldactone Labs: Phosphorus 4.8 (H) CBG 98-127  Diet Order:   Diet Order            DIET DYS 3 Room service appropriate? Yes with Assist; Fluid consistency: Thin  Diet effective now              EDUCATION NEEDS:   Not appropriate for education at this time  Skin:  Skin Assessment: Skin Integrity Issues: Skin Integrity Issues:: DTI, Stage II, Stage III DTI: bilateral heels Stage II: buttocks Stage III: n/a Incisions: L wrist, neck Other: n/a  Last BM:  3/28  Height:   Ht Readings from Last 1 Encounters:  08/09/19 5' 8"  (1.727 m)    Weight:   Wt Readings from Last 1 Encounters:  11/04/19 119 kg    Ideal Body Weight:  70 kg  BMI:  Body mass index is 39.89 kg/m.  Estimated Nutritional Needs:   Kcal:  5945-8592 kcal (25-30 kcal/kg)  Protein:  180-215 grams  Fluid:  >/= 2.5 L/day   [name] RD, LDN Clinical Nutrition Pager # - [phone]

## 2019-11-04 NOTE — Progress Notes (Signed)
OT Treatment Note  Pt seen for additional session to focus on BUE strengthening. Scapular strength continues to improve, however pt with apparent impingement B shoulders due shoulder girdle weakness. Pt with weak flexor groups in BUE, R elbowflexion weaker than L  R wrist extension weaker than L as well - pt has been wearing wrist cock-up splint to prevent overstretch injury. Today Pt demonstrated active R wrist extension with gravity eliminated and trace elbow flexion on R. Pt substitutes with shoulder elevation on L due to weak elbow flexors in order for gravity and momentum to facilitate elbow flexion. Pt with B dependent edema in hands - asked nsg to please keep B hands elevated on pillows to reduce edema and increase ROM/functional use of hands.  Will continue to follow acutely.    11/04/19 1500  OT Visit Information  Last OT Received On 11/04/19  Assistance Needed +2  History of Present Illness 44 y/o M admitted 12/14 with COVID pneumonia causing acute hypoxemic respiratory failure.   Developed pneumomediastinum 12/23 with concern for possible bilateral small pneumothoraces on 12/24. Pt intubated on 12/26 and started on ECMO on 12/27. Tracheostomy on 12/29 with bleeding from trach an dother sites on 12/31. Bronch on 1/11 and 1/15. Slowly weaning sedation 2/5. Continuing to wean sedation and starting sweep trials to wean ECMO - Decannulated 3/4  Precautions  Precautions Fall;Other (comment)  Precaution Comments PEG, trach; bilat foot wounds  Required Braces or Orthoses Other Brace  Other Brace B prevalon boots; R wrist cock-up splint  Pain Assessment  Pain Assessment Faces  Faces Pain Scale 4  Pain Location B shoulders with ROM @ 90; R digits with ROM; especially ring finger  Pain Descriptors / Indicators Discomfort;Grimacing  Pain Intervention(s) Limited activity within patient's tolerance;Heat applied (heat to R hand)  Cognition  Arousal/Alertness Awake/alert  Behavior During Therapy  WFL for tasks assessed/performed  Overall Cognitive Status Impaired/Different from baseline  Area of Impairment Attention;Memory;Awareness  Current Attention Level Selective  Memory Decreased short-term memory  Following Commands Follows one step commands consistently  Problem Solving Slow processing  General Comments continues to demonstrate improvement  General Exercises - Upper Extremity  Shoulder Flexion Both;15 reps;Supine  Elbow Flexion Both;AROM;AAROM;PROM;15 reps;Supine (gravity eliminated)  Elbow Extension AROM;AAROM;Both;15 reps;Supine (against gravity)  Wrist Flexion Both;15 reps;Seated;AROM  Wrist Extension Both;15 reps;PROM;AROM;AAROM;Supine (gravity eliminated on R)  Digit Composite Flexion AROM;AAROM;Both;15 reps;Seated  Shoulder ADduction AROM;AAROM;Both;15 reps;Supine  Shoulder Horizontal ADduction AROM;AAROM;Both;15 reps;Supine  Other Exercises  Other Exercises scapular strengthening B in upward/downward rotatio; elevation/depression  Other Exercises heat to R hand, wrapped in composite flexion for continuous passive stretch followed by composite flexion/extension   OT - End of Session  Equipment Utilized During Treatment Oxygen (2L)  Activity Tolerance Patient tolerated treatment well  Patient left in bed;with call bell/phone within reach  Nurse Communication Mobility status;Other (comment) (Keep BUE/hands elevated on 2 pillows to decrease edema)  OT Assessment/Plan  OT Plan Discharge plan remains appropriate  OT Visit Diagnosis Muscle weakness (generalized) (M62.81);Other abnormalities of gait and mobility (R26.89);Other symptoms and signs involving cognitive function;Pain  Pain - part of body Hand;Shoulder (B)  OT Frequency (ACUTE ONLY) Min 3X/week  Follow Up Recommendations CIR;Supervision/Assistance - 24 hour  OT Equipment Other (comment) (TBA)  AM-PAC OT "6 Clicks" Daily Activity Outcome Measure (Version 2)  Help from another person eating meals? 1   Help from another person taking care of personal grooming? 2  Help from another person toileting, which includes using toliet, bedpan, or urinal?  1  Help from another person bathing (including washing, rinsing, drying)? 1  Help from another person to put on and taking off regular upper body clothing? 1  Help from another person to put on and taking off regular lower body clothing? 1  6 Click Score 7  OT Goal Progression  Progress towards OT goals Progressing toward goals  Acute Rehab OT Goals  Patient Stated Goal regain strength and independence  OT Goal Formulation With patient  Time For Goal Achievement 11/07/19  Potential to Achieve Goals Good  ADL Goals  Additional ADL Goal #1 Pt will maintain midline postural control in supported sitting x 5 min with min guard A in preparation for ADL training  Additional ADL Goal #2 Pt will complete sit - stand activity with Max A +2 x 1 min with use of lift equipment  to increase strength for functional mobility  Additional ADL Goal #3 Pt will be able to move Lt UE to mouth with mod A  Additional ADL Goal #4 Pt will demonstrate B active elbow flexion in gravity eliminated position to increase funcitonal use of arms  OT Time Calculation  OT Start Time (ACUTE ONLY) 1330  OT Stop Time (ACUTE ONLY) 1400  OT Time Calculation (min) 30 min  OT General Charges  $OT Visit 1 Visit  OT Treatments  $Neuromuscular Re-education 8-22 mins  $Therapeutic Exercise 8-22 mins  Maurie Boettcher, OT/L   Acute OT Clinical Specialist [place] Pager [phone] Office [phone]

## 2019-11-04 NOTE — Progress Notes (Signed)
Patient ID: [name], male   DOB: October 02, 1975, 44 y.o.   MRN: [id number]    Advanced Heart Failure Rounding Note   Subjective:    Remains on trach collar. Denies dyspnea. Remains weak. Continues to do his exercises.   Objective:   Weight Range:  Vital Signs:   Temp:  [98.2 F (36.8 C)-99.7 F (37.6 C)] 98.2 F (36.8 C) (03/29 0805) Pulse Rate:  [85-111] 106 (03/29 0900) Resp:  [16-25] 19 (03/29 0900) BP: (109-157)/(54-93) 128/67 (03/29 0900) SpO2:  [92 %-99 %] 93 % (03/29 0900) Weight:  [220 kg] 119 kg (03/29 0700) Last BM Date: 11/03/19  Weight change: Filed Weights   11/02/19 0630 11/03/19 0545 11/04/19 0700  Weight: 118.5 kg 112.4 kg 119 kg    Intake/Output:   Intake/Output Summary (Last 24 hours) at 11/04/2019 1035 Last data filed at 11/04/2019 0846 Gross per 24 hour  Intake 2947.64 ml  Output 1400 ml  Net 1547.64 ml     PHYSICAL EXAM: General:  Obese WM, lying in bed. No resp difficulty HEENT: normal Neck: supple. no JVD. + Trach Carotids 2+ bilat; no bruits. No lymphadenopathy or thryomegaly appreciated. Cor: PMI nondisplaced. Regular rate & rhythm. No rubs, gallops or murmurs. Lungs: clear Abdomen: + obese soft, nontender, nondistended. No hepatosplenomegaly. No bruits or masses. Good bowel sounds. + PEG Extremities: no cyanosis, clubbing, rash, edema eschar on feet Neuro: alert & orientedx3, cranial nerves grossly intact. moves all 4 extremities w/o difficulty. Affect pleasant    Telemetry: sinus 90-100s Personally reviewed   Labs: Basic Metabolic Panel: Recent Labs  Lab 10/30/19 0326 11/01/19 0225 11/03/19 0509  NA 145 143 140  K 3.9 3.9 4.2  CL 101 101 100  CO2 34* 33* 30  GLUCOSE 141* 115* 99  BUN 35* 43* 41*  CREATININE 0.62 0.53* 0.54*  CALCIUM 9.7 9.3 9.6  MG 1.7 1.8 1.7  PHOS 4.8* 4.7* 4.8*    Liver Function Tests: No results for input(s): AST, ALT, ALKPHOS, BILITOT, PROT, ALBUMIN in the last 168 hours. No results for  input(s): LIPASE, AMYLASE in the last 168 hours. No results for input(s): AMMONIA in the last 168 hours.  CBC: Recent Labs  Lab 10/30/19 0326 11/01/19 0225 11/03/19 0509  WBC 7.7 6.7 6.1  HGB 11.8* 11.2* 11.5*  HCT 39.8 37.5* 38.1*  MCV 93.4 94.2 92.7  PLT 159 125* 113*    Cardiac Enzymes: No results for input(s): CKTOTAL, CKMB, CKMBINDEX, TROPONINI in the last 168 hours.  BNP: BNP (last 3 results) Recent Labs    07/22/19 1039  BNP 15.3    ProBNP (last 3 results) No results for input(s): PROBNP in the last 8760 hours.    Other results:  Imaging: No results found.   Medications:     Scheduled Medications: . amLODipine  10 mg Per Tube Daily  . vitamin C  500 mg Per Tube Daily  . carvedilol  25 mg Per Tube BID WC  . chlorhexidine  15 mL Mouth Rinse BID  . Chlorhexidine Gluconate Cloth  6 each Topical Daily  . clonazepam  1 mg Per Tube QHS  . doxazosin  1 mg Per Tube QHS  . enoxaparin (LOVENOX) injection  60 mg Subcutaneous Q24H  . escitalopram  20 mg Per Tube Daily  . feeding supplement (PRO-STAT SUGAR FREE 64)  60 mL Per Tube TID  . free water  150 mL Per Tube q12n4p  . Gerhardt's butt cream   Topical Daily  .  insulin aspart  0-15 Units Subcutaneous Q6H  . mouth rinse  15 mL Mouth Rinse q12n4p  . melatonin  6 mg Per Tube QHS  . montelukast  10 mg Per Tube QHS  . nutrition supplement (JUVEN)  1 packet Per Tube BID BM  . pantoprazole sodium  40 mg Per Tube BID  . polycarbophil  625 mg Oral Daily  . psyllium  1 packet Oral TID  . sodium chloride flush  10-40 mL Intracatheter Q12H  . spironolactone  25 mg Oral Daily  . traZODone  100 mg Per Tube QHS    Infusions: . sodium chloride Stopped (10/13/19 0548)  . feeding supplement (OSMOLITE 1.5 CAL) 1,000 mL (11/03/19 2038)    PRN Medications: sodium chloride, acetaminophen, albuterol, calcium carbonate, [COMPLETED] diazepam **FOLLOWED BY** diazepam, hydrALAZINE, HYDROmorphone, ipratropium-albuterol,  loperamide HCl, ondansetron (ZOFRAN) IV, sodium chloride, sodium chloride flush   Assessment/Plan:    1. Acute hypoxic/hypercarbic respiratory failure due to COVID PNA: Remained markedly hypoxic despite full support. Intubated 12/26. S/p bilateral CTs 12/26 for pneumothoraces.  TEE on 12/26 LVEF 70% RV ok. VV ECMO begun 12/26. Tracheostomy 12/29.  COVID ARDS at this point. ECMO circuit changed 1/8 & 1/23. Crescent cannula placed 2/2 (femoral cannuals removed) - Decannulated 3/4 - Still w/ trach collar but currently on [place]. O2 sats stable.  - PCCM following and have downsized trach. Feel that ongoing weakness is biggest barrier to decannulation. Continue trach trials No change today - Continue exercises with PT/OT.   2. COVID PNA: management of COVID infection per CCM. CXR with bilateral multifocal PNA progressing to likely ARDS.  - s/p 10 days of remdesivir - 12/16, 12/2 s/p tociluzimab - 12/17 s/p convalescent plasma - Decadron completed 1/11 - Resolved  3. FEN - Remains on TFs. Loose stools improved with change in regimen - Now on D3 diet. stable - Continue PPI. Can add carafate as needed   4. Hypernatremia - no labs today. Sodium 140 yesterday  -I decreased FW to 150 q12 on 3/27. Can continue to drop as needed  5. DTIs - On both feet.  Improving  6.  Hypokalemia. - K 4.2 yesterday  7.  Diffuse muscle weakness. -This is consistent with ICU/steroid/paralytics myopathy.  Continue passive range of motion exercises with PT.   -PT and OT are following.  Both have recommended CIR.  CIR following closley and feels he is not ready yet. Continue to push w/ acute-side PT/OT to maximize strength, bed mobility, OOB to chair. - continue OT/PT. Appreciate all the assistance - Remains weaker on right than left. Continue exercisese  8. HTN - Blood pressure well controlled. Continue current regimen.  10. Hypomag - mag 1.7 will supp again   11. Urinary retention - Improved. Continue  Flomax - reduced anti-cholinergics (off seroquel)  - follow PVRs  Length of Stay: 105  Marilyn Wing MD 11/04/2019, 10:35 AM  Advanced Heart Failure Team Pager [phone] (M-F; [place])  Please contact [facility] for night-coverage after hours (4p -7a ) and weekends on [email or web address]

## 2019-11-04 NOTE — Progress Notes (Signed)
Occupational Therapy Treatment Patient Details Name: [name] MRN: [id number] DOB: 08-21-1975 Today's Date: 11/04/2019    History of present illness 44 y/o M admitted 12/14 with COVID pneumonia causing acute hypoxemic respiratory failure.   Developed pneumomediastinum 12/23 with concern for possible bilateral small pneumothoraces on 12/24. Pt intubated on 12/26 and started on ECMO on 12/27. Tracheostomy on 12/29 with bleeding from trach an dother sites on 12/31. Bronch on 1/11 and 1/15. Slowly weaning sedation 2/5. Continuing to wean sedation and starting sweep trials to wean ECMO - Decannulated 3/4   OT comments  Pt seen as cotreat with PT to advance to EOB. VSS on 2L.  Pt making significant progress. Able to maintain midline postural control EOB with min guard A using BUE for support. Increased ability to weight shift anteriorly/posteriorly and laterally, demonstrating increase in trunk control and core strength. Pt continues to demonstrate increase in BUE, however unable to complete hand to mouth pattern at this time. Will attempt to return later to further address BUE strengthening.  Continue to recommend rehab at [facility].   Follow Up Recommendations  CIR;Supervision/Assistance - 24 hour    Equipment Recommendations  Other (comment)(TBA)    Recommendations for Other Services      Precautions / Restrictions Precautions Precautions: Fall;Other (comment) Precaution Comments: PEG, trach; bilat foot wounds Required Braces or Orthoses: Other Brace Other Brace: B prevalon boots; R wrist cock-up splint Restrictions Weight Bearing Restrictions: No       Mobility Bed Mobility Overal bed mobility: Needs Assistance       Supine to sit: Max assist;+2 for physical assistance;+2 for safety/equipment Sit to supine: Max assist;+2 for physical assistance;+2 for safety/equipment   General bed mobility comments: Pt with some initiation of moving legs toward edge of bed adn using trunkto  rotate while reaching.  Transfers                      Balance Overall balance assessment: Needs assistance   Sitting balance-Leahy Scale: Poor Sitting balance - Comments: Able to maintain balance EOB with minguard A - sat @ 20 min during activity; increased assistance provided when fatigued/working on BLE exercise                                   ADL either performed or assessed with clinical judgement   ADL Overall ADL's : Needs assistance/impaired Eating/Feeding: Total assistance   Grooming: Maximal assistance;Wash/dry face;Oral care                                       Vision       Perception     Praxis      Cognition Arousal/Alertness: Awake/alert Behavior During Therapy: Anxious;WFL for tasks assessed/performed Overall Cognitive Status: Impaired/Different from baseline Area of Impairment: Memory;Awareness;Problem solving                   Current Attention Level: Selective Memory: Decreased short-term memory Following Commands: Follows one step commands consistently     Problem Solving: Slow processing General Comments: continues to demonstrate improvement        Exercises Other Exercises Other Exercises: Facilitated trunk extension with focus on head extended and B scapulretracted and - incorporating BUE to weight bearhelp to stabilize Other Exercises: laterally weight shifting R/L forarms then pushing up  into upright posture x 10 Other Exercises: B feet placed on stools to improve posture EOB adn give feet support. Pt placed hands on stool rails and worked on anteriror/posterior weight shifts; place and hold in upright posture with S   Shoulder Instructions       General Comments      Pertinent Vitals/ Pain       Pain Assessment: Faces Faces Pain Scale: Hurts little more Pain Location: B knees;hips; bottom of feet; Shoulders wtih ROM Pain Descriptors / Indicators: Discomfort;Grimacing Pain  Intervention(s): Limited activity within patient's tolerance;Repositioned  Home Living                                          Prior Functioning/Environment              Frequency  Min 3X/week        Progress Toward Goals  OT Goals(current goals can now be found in the care plan section)  Progress towards OT goals: Progressing toward goals  Acute Rehab OT Goals Patient Stated Goal: regain strength and independence OT Goal Formulation: With patient Time For Goal Achievement: 11/07/19 Potential to Achieve Goals: Good ADL Goals Additional ADL Goal #1: Pt will maintain midline postural control in supported sitting x 5 min with min guard A in preparation for ADL training Additional ADL Goal #2: Pt will complete sit - stand activity with Max A +2 x 1 min with use of lift equipment  to increase strength for functional mobility Additional ADL Goal #3: Pt will be able to move Lt UE to mouth with mod A Additional ADL Goal #4: Pt will demonstrate B active elbow flexion in gravity eliminated position to increase funcitonal use of arms  Plan Discharge plan remains appropriate    Co-evaluation    PT/OT/SLP Co-Evaluation/Treatment: Yes Reason for Co-Treatment: Complexity of the patient's impairments (multi-system involvement);To address functional/ADL transfers;For patient/therapist safety   OT goals addressed during session: ADL's and self-care;Strengthening/ROM      AM-PAC OT "6 Clicks" Daily Activity     Outcome Measure   Help from another person eating meals?: Total Help from another person taking care of personal grooming?: A Lot Help from another person toileting, which includes using toliet, bedpan, or urinal?: Total Help from another person bathing (including washing, rinsing, drying)?: Total Help from another person to put on and taking off regular upper body clothing?: Total Help from another person to put on and taking off regular lower body  clothing?: Total 6 Click Score: 7    End of Session Equipment Utilized During Treatment: Oxygen(2L)  OT Visit Diagnosis: Muscle weakness (generalized) (M62.81);Other abnormalities of gait and mobility (R26.89);Other symptoms and signs involving cognitive function;Pain Pain - Right/Left: (B) Pain - part of body: Shoulder;Hip;Knee(feet)   Activity Tolerance Patient tolerated treatment well   Patient Left in bed;with call bell/phone within reach;with SCD's reapplied   Nurse Communication Mobility status        Time: 4098-1191 OT Time Calculation (min): 38 min  Charges: OT General Charges $OT Visit: 1 Visit OT Treatments $Neuromuscular Re-education: 8-22 mins  Maurie Boettcher, OT/L   Acute OT Clinical Specialist [place] Pager [phone] Office [phone]    [facility] 11/04/2019, 12:14 PM

## 2019-11-04 NOTE — Progress Notes (Signed)
Physical Therapy Treatment Patient Details Name: [name] MRN: [id number] DOB: 21-Jan-1976 Today's Date: 11/04/2019    History of Present Illness 44 y/o M admitted 12/14 with COVID pneumonia causing acute hypoxemic respiratory failure.   Developed pneumomediastinum 12/23 with concern for possible bilateral small pneumothoraces on 12/24. Pt intubated on 12/26 and started on ECMO on 12/27. Tracheostomy on 12/29 with bleeding from trach an dother sites on 12/31. Bronch on 1/11 and 1/15. Slowly weaning sedation 2/5. Continuing to wean sedation and starting sweep trials to wean ECMO - Decannulated 3/4    PT Comments    Pt continues to make slow, steady progress. Continue to recommend CIR.    Follow Up Recommendations  CIR;Supervision/Assistance - 24 hour     Equipment Recommendations  Other (comment)(To be determined)    Recommendations for Other Services       Precautions / Restrictions Precautions Precautions: Fall;Other (comment) Precaution Comments: PEG, trach; bilat foot wounds Required Braces or Orthoses: Other Brace Other Brace: B prevalon boots; R wrist cock-up splint Restrictions Weight Bearing Restrictions: No    Mobility  Bed Mobility Overal bed mobility: Needs Assistance       Supine to sit: Max assist;+2 for physical assistance;+2 for safety/equipment Sit to supine: Max assist;+2 for physical assistance;+2 for safety/equipment   General bed mobility comments: Pt able to assist bringing legs off EOB. Assist to bring legs off of bed, elevate trunk into sitting, and bring hips to EOB.  Transfers                    Ambulation/Gait                 Stairs             Wheelchair Mobility    Modified Rankin (Stroke Patients Only)       Balance Overall balance assessment: Needs assistance Sitting-balance support: Feet supported;Bilateral upper extremity supported Sitting balance-Leahy Scale: Poor Sitting balance - Comments: Sat EOB  ~20 minutes with min to min guard.  Postural control: Posterior lean                                  Cognition Arousal/Alertness: Awake/alert Behavior During Therapy: Anxious;WFL for tasks assessed/performed Overall Cognitive Status: Impaired/Different from baseline Area of Impairment: Memory;Awareness;Problem solving                   Current Attention Level: Selective Memory: Decreased short-term memory Following Commands: Follows one step commands consistently     Problem Solving: Slow processing General Comments: continues to demonstrate improvement      Exercises General Exercises - Lower Extremity Ankle Circles/Pumps: AAROM;Both;5 reps;Supine Long Arc Quad: AAROM;Both;10 reps;Seated Hip ABduction/ADduction: AROM;Both;10 reps(isometric) Other Exercises Other Exercises: Facilitated trunk extension with focus on head extended and B scapulretracted and - incorporating BUE to weight bearhelp to stabilize Other Exercises: laterally weight shifting R/L forarms then pushing up into upright posture x 10 Other Exercises: B feet placed on stools to improve posture EOB adn give feet support. Pt placed hands on stool rails and worked on anteriror/posterior weight shifts; place and hold in upright posture with S    General Comments        Pertinent Vitals/Pain Pain Assessment: Faces Faces Pain Scale: Hurts little more Pain Location: B knees;hips; bottom of feet; Shoulders wtih ROM Pain Descriptors / Indicators: Discomfort;Grimacing Pain Intervention(s): Limited activity within patient's tolerance  Home Living                      Prior Function            PT Goals (current goals can now be found in the care plan section) Acute Rehab PT Goals Patient Stated Goal: regain strength and independence Progress towards PT goals: Progressing toward goals    Frequency    Min 3X/week      PT Plan Current plan remains appropriate     Co-evaluation PT/OT/SLP Co-Evaluation/Treatment: Yes Reason for Co-Treatment: Complexity of the patient's impairments (multi-system involvement);For patient/therapist safety PT goals addressed during session: Mobility/safety with mobility;Balance OT goals addressed during session: ADL's and self-care;Strengthening/ROM      AM-PAC PT "6 Clicks" Mobility   Outcome Measure  Help needed turning from your back to your side while in a flat bed without using bedrails?: Total Help needed moving from lying on your back to sitting on the side of a flat bed without using bedrails?: Total Help needed moving to and from a bed to a chair (including a wheelchair)?: Total Help needed standing up from a chair using your arms (e.g., wheelchair or bedside chair)?: Total Help needed to walk in hospital room?: Total Help needed climbing 3-5 steps with a railing? : Total 6 Click Score: 6    End of Session   Activity Tolerance: Patient tolerated treatment well Patient left: in bed;with call bell/phone within reach Nurse Communication: Mobility status PT Visit Diagnosis: Muscle weakness (generalized) (M62.81);Other symptoms and signs involving the nervous system (R29.898);Other abnormalities of gait and mobility (R26.89)     Time: 6979-4801 PT Time Calculation (min) (ACUTE ONLY): 41 min  Charges:  $Therapeutic Activity: 23-37 mins                     [place] Pager [phone] Office [place] 11/04/2019, 2:26 PM

## 2019-11-05 LAB — BASIC METABOLIC PANEL
Anion gap: 9 (ref 5–15)
BUN: 46 mg/dL — ABNORMAL HIGH (ref 6–20)
CO2: 31 mmol/L (ref 22–32)
Calcium: 9.6 mg/dL (ref 8.9–10.3)
Chloride: 98 mmol/L (ref 98–111)
Creatinine, Ser: 0.6 mg/dL — ABNORMAL LOW (ref 0.61–1.24)
GFR calc Af Amer: >60 mL/min (ref >60)
GFR calc non Af Amer: >60 mL/min (ref >60)
Glucose, Bld: 109 mg/dL — ABNORMAL HIGH (ref 70–99)
Potassium: 4.2 mmol/L (ref 3.5–5.1)
Sodium: 138 mmol/L (ref 135–145)

## 2019-11-05 LAB — MAGNESIUM: Magnesium: 1.7 mg/dL (ref 1.7–2.4)

## 2019-11-05 LAB — GLUCOSE, CAPILLARY
Glucose-Capillary: 119 mg/dL — ABNORMAL HIGH (ref 70–99)
Glucose-Capillary: 129 mg/dL — ABNORMAL HIGH (ref 70–99)
Glucose-Capillary: 103 mg/dL — ABNORMAL HIGH (ref 70–99)
Glucose-Capillary: 145 mg/dL — ABNORMAL HIGH (ref 70–99)

## 2019-11-05 LAB — CBC
HCT: 38.3 % — ABNORMAL LOW (ref 39.0–52.0)
Hemoglobin: 11.7 g/dL — ABNORMAL LOW (ref 13.0–17.0)
MCH: 27.8 pg (ref 26.0–34.0)
MCHC: 30.5 g/dL (ref 30.0–36.0)
MCV: 91 fL (ref 80.0–100.0)
Platelets: 109 10*3/uL — ABNORMAL LOW (ref 150–400)
RBC: 4.21 MIL/uL — ABNORMAL LOW (ref 4.22–5.81)
RDW: 15.9 % — ABNORMAL HIGH (ref 11.5–15.5)
WBC: 6.7 10*3/uL (ref 4.0–10.5)
nRBC: 0 % (ref 0.0–0.2)

## 2019-11-05 LAB — PHOSPHORUS: Phosphorus: 4.7 mg/dL — ABNORMAL HIGH (ref 2.5–4.6)

## 2019-11-05 MED ORDER — MAGNESIUM SULFATE 2 GM/50ML IV SOLN
2.0000 g | Freq: Once | INTRAVENOUS | Status: AC
  Administered 2019-11-05: 2 g via INTRAVENOUS
  Filled 2019-11-05: qty 50

## 2019-11-05 MED ORDER — LIDOCAINE HCL (PF) 1 % IJ SOLN
INTRAMUSCULAR | Status: AC
  Filled 2019-11-05: qty 5

## 2019-11-05 NOTE — Progress Notes (Signed)
Patient ID: [name], male   DOB: Jul 25, 1976, 44 y.o.   MRN: [id number]    Advanced Heart Failure Rounding Note   Subjective:    Remains on vent through trach collar.   Still very weak. Requires nurses to feed him for every meal. Also requires frequent repositioning due to discomfort and to avoid pressure ulcers.   Working with PT/OT every other day. ICU nurses doing his exercise as well at least 2x/day. Getting chest PT. Episodic periods of incontinence.   Hands swelling but weight stable. Denies SOB, orthopnea or PND.   Objective:   Weight Range:  Vital Signs:   Temp:  [97.9 F (36.6 C)-98.6 F (37 C)] 97.9 F (36.6 C) (03/30 0846) Pulse Rate:  [76-104] 91 (03/30 0802) Resp:  [14-29] 18 (03/30 0802) BP: (96-148)/(29-91) 137/75 (03/30 0802) SpO2:  [78 %-100 %] 99 % (03/30 0802) FiO2 (%):  [98 %-100 %] 100 % (03/30 0413) Weight:  [117.5 kg] 117.5 kg (03/30 0600) Last BM Date: 11/04/19  Weight change: Filed Weights   11/03/19 0545 11/04/19 0700 11/05/19 0600  Weight: 112.4 kg 119 kg 117.5 kg    Intake/Output:   Intake/Output Summary (Last 24 hours) at 11/05/2019 1048 Last data filed at 11/05/2019 1000 Gross per 24 hour  Intake 1370 ml  Output 2300 ml  Net -930 ml     PHYSICAL EXAM: General:  Obese WM, lying in bed. No resp difficulty HEENT: Normal  Neck: supple. no JVD. + trach collar Carotids 2+ bilat; no bruits. No lymphadenopathy or thryomegaly appreciated. Cor: PMI nondisplaced. Regular rate & rhythm. No rubs, gallops or murmurs. Lungs: clear Abdomen: obese soft, nontender, nondistended. No hepatosplenomegaly. No bruits or masses. Good bowel sounds. + PEG Extremities: no cyanosis, clubbing, rash, + UE edema Neuro: alert & orientedx3, cranial nerves grossly intact. Very weak in all 4 R orse than left    Telemetry: sinus 90-100 Personally reviewed   Labs: Basic Metabolic Panel: Recent Labs  Lab 10/30/19 0326 10/30/19 0326 11/01/19 0225  11/03/19 0509 11/05/19 0302  NA 145  --  143 140 138  K 3.9  --  3.9 4.2 4.2  CL 101  --  101 100 98  CO2 34*  --  33* 30 31  GLUCOSE 141*  --  115* 99 109*  BUN 35*  --  43* 41* 46*  CREATININE 0.62  --  0.53* 0.54* 0.60*  CALCIUM 9.7   < > 9.3 9.6 9.6  MG 1.7  --  1.8 1.7 1.7  PHOS 4.8*  --  4.7* 4.8* 4.7*   < > = values in this interval not displayed.    Liver Function Tests: No results for input(s): AST, ALT, ALKPHOS, BILITOT, PROT, ALBUMIN in the last 168 hours. No results for input(s): LIPASE, AMYLASE in the last 168 hours. No results for input(s): AMMONIA in the last 168 hours.  CBC: Recent Labs  Lab 10/30/19 0326 11/01/19 0225 11/03/19 0509 11/05/19 0302  WBC 7.7 6.7 6.1 6.7  HGB 11.8* 11.2* 11.5* 11.7*  HCT 39.8 37.5* 38.1* 38.3*  MCV 93.4 94.2 92.7 91.0  PLT 159 125* 113* 109*    Cardiac Enzymes: No results for input(s): CKTOTAL, CKMB, CKMBINDEX, TROPONINI in the last 168 hours.  BNP: BNP (last 3 results) Recent Labs    07/22/19 1039  BNP 15.3    ProBNP (last 3 results) No results for input(s): PROBNP in the last 8760 hours.    Other results:  Imaging: No results  found.   Medications:     Scheduled Medications: . amLODipine  10 mg Per Tube Daily  . vitamin C  500 mg Per Tube Daily  . carvedilol  25 mg Per Tube BID WC  . chlorhexidine  15 mL Mouth Rinse BID  . Chlorhexidine Gluconate Cloth  6 each Topical Daily  . clonazepam  1 mg Per Tube QHS  . doxazosin  1 mg Per Tube QHS  . enoxaparin (LOVENOX) injection  60 mg Subcutaneous Q24H  . escitalopram  20 mg Per Tube Daily  . feeding supplement (ENSURE ENLIVE)  237 mL Oral BID BM  . feeding supplement (OSMOLITE 1.5 CAL)  1,000 mL Per Tube Q24H  . feeding supplement (PRO-STAT SUGAR FREE 64)  60 mL Per Tube QID  . free water  150 mL Per Tube q12n4p  . Gerhardt's butt cream   Topical Daily  . insulin aspart  0-15 Units Subcutaneous Q6H  . mouth rinse  15 mL Mouth Rinse q12n4p  .  melatonin  6 mg Per Tube QHS  . montelukast  10 mg Per Tube QHS  . nutrition supplement (JUVEN)  1 packet Per Tube BID BM  . pantoprazole sodium  40 mg Per Tube BID  . polycarbophil  625 mg Oral Daily  . psyllium  1 packet Oral TID  . sodium chloride flush  10-40 mL Intracatheter Q12H  . spironolactone  25 mg Oral Daily  . traZODone  100 mg Per Tube QHS    Infusions: . sodium chloride 10 mL/hr at 11/05/19 0744    PRN Medications: sodium chloride, acetaminophen, albuterol, calcium carbonate, [COMPLETED] diazepam **FOLLOWED BY** diazepam, hydrALAZINE, HYDROmorphone, ipratropium-albuterol, loperamide HCl, ondansetron (ZOFRAN) IV, sodium chloride, sodium chloride flush   Assessment/Plan:    1. Acute hypoxic/hypercarbic respiratory failure due to COVID PNA: Remained markedly hypoxic despite full support. Intubated 12/26. S/p bilateral CTs 12/26 for pneumothoraces.  TEE on 12/26 LVEF 70% RV ok. VV ECMO begun 12/26. Tracheostomy 12/29.  COVID ARDS at this point. ECMO circuit changed 1/8 & 1/23. [place] cannula placed 2/2 (femoral cannuals removed) - Decannulated 3/4 - Still w/ trach collar but currently on [place]. O2 sats stable.  - PCCM following and have downsized trach. Feel that ongoing weakness is biggest barrier to decannulation. Continue trach trials No change today - Continue exercises with PT/OT.  - Remains very weak PT/OT working with patient every other day. ICU nurses also doing exercises with him 2x/day - Requires nurses to feed him every meal and frequent repositioning  2. COVID PNA: management of COVID infection per CCM. CXR with bilateral multifocal PNA progressing to likely ARDS.  - s/p 10 days of remdesivir - 12/16, 12/2 s/p tociluzimab - 12/17 s/p convalescent plasma - Decadron completed 1/11 - Resolved  3. FEN - Remains on TFs. Loose stools improved with change in regimen - Now on D3 diet. stable - Continue PPI.    4. Hypernatremia - no labs today. Sodium 138   - can stop FW  5. DTIs - On both feet.  Improving  6.  Hypokalemia. - K 4.2  7.  Diffuse muscle weakness. -This is consistent with ICU/steroid/paralytics myopathy.  Continue passive range of motion exercises with PT.   -PT and OT are following.  Both have recommended CIR.  CIR following closley and feels he is not ready yet. Continue to push w/ acute-side PT/OT to maximize strength, bed mobility, OOB to chair. - continue OT/PT. Appreciate all the assistance - Remains very weak PT/OT  working with patient every other day. ICU nurses also doing exercises with him 2x/day - Requires nurses to feed him every meal and frequent repositioning  8. HTN - Blood pressure well controlled. Continue current regimen.  10. Hypomag - mag 1.7 will supp again   11. Urinary retention - Improved. Continue Flomax   Length of Stay: 106  Glori Bickers MD 11/05/2019, 10:48 AM  Advanced Heart Failure Team Pager [phone] (M-F; 7a - 4p)  Please contact [facility] for night-coverage after hours (4p -7a ) and weekends on [email or web address]

## 2019-11-05 NOTE — Progress Notes (Signed)
OT Treatment Note  Ace wrap removed with notable increase in ROM after sustained passive stretch. Pt able to oppose thumb to index and middle fingers. Pt continues to require use of R wrist splint due to wrist drop/weakness. Pt to wear wrist splint in 2 hour intervals during the day and work on actively moving wrist when removed. While wearing splint, pt need to work with theraputty. Recommend pt be placed in chair position in bed with bedside table over bed to support BUE. Place towel on BUE for pt to work on B elbow flexion/ext. Pt "proud of himself". Will continue to follow.     11/05/19 1804  OT Visit Information  Last OT Received On 11/05/19  Assistance Needed +2 (3 to stand)  History of Present Illness 44 y/o M admitted 12/14 with COVID pneumonia causing acute hypoxemic respiratory failure.   Developed pneumomediastinum 12/23 with concern for possible bilateral small pneumothoraces on 12/24. Pt intubated on 12/26 and started on ECMO on 12/27. Tracheostomy on 12/29 with bleeding from trach an dother sites on 12/31. Bronch on 1/11 and 1/15. Slowly weaning sedation 2/5. Continuing to wean sedation and starting sweep trials to wean ECMO - Decannulated 3/4  Precautions  Precautions Fall;Other (comment)  Precaution Comments PEG, trach; bilat foot wounds  Required Braces or Orthoses Other Brace  Other Brace B prevalon boots; R wrist cock-up splint  Pain Assessment  Pain Assessment Faces  Faces Pain Scale 4  Pain Location with R digit ROM  Pain Descriptors / Indicators Grimacing;Guarding  Pain Intervention(s) Limited activity within patient's tolerance  Cognition  Arousal/Alertness Awake/alert  Behavior During Therapy Anxious;WFL for tasks assessed/performed  Overall Cognitive Status Impaired/Different from baseline  Area of Impairment Memory;Awareness;Problem solving  Current Attention Level Selective  Memory Decreased short-term memory  Following Commands Follows one step commands  consistently  Safety/Judgement Decreased awareness of safety;Decreased awareness of deficits  Awareness Emergent  Problem Solving Slow processing  General Comments continues to demonstrate improvement  Other Exercises  Other Exercises ace wrap removed; composite flex/ext completed. Pt with @ 1/2 lag in flexion form palm after continued passive stretch. Able to oppose index and middle fingers  Other Exercises Began use of soft/tan theraputty with B hands; Pt does better with use of wrist splint when trying to strengthen hand due to trace wrist extension - educated nurse; pt able to pick up putty adn release with min A to support forearm  Other Exercises Completed hand to mouth movement x 20BUE - LUE continues to be stronger than R  OT - End of Session  Equipment Utilized During Treatment Oxygen (2L)  Activity Tolerance Patient tolerated treatment well  Patient left in bed;with call bell/phone within reach  Nurse Communication Mobility status;Other (comment) (use of wrist splint; positioning BUE; encourage BUE ex)  OT Assessment/Plan  OT Plan Discharge plan remains appropriate  OT Visit Diagnosis Muscle weakness (generalized) (M62.81);Other abnormalities of gait and mobility (R26.89);Other symptoms and signs involving cognitive function;Pain  Pain - Right/Left Right  Pain - part of body Hand  OT Frequency (ACUTE ONLY) Min 3X/week  Recommendations for Other Services Rehab consult  Follow Up Recommendations CIR;Supervision/Assistance - 24 hour  OT Equipment Other (comment) (TBA)  AM-PAC OT "6 Clicks" Daily Activity Outcome Measure (Version 2)  Help from another person eating meals? 1  Help from another person taking care of personal grooming? 2  Help from another person toileting, which includes using toliet, bedpan, or urinal? 1  Help from another person bathing (including washing,  rinsing, drying)? 1  Help from another person to put on and taking off regular upper body clothing? 1  Help  from another person to put on and taking off regular lower body clothing? 1  6 Click Score 7  OT Goal Progression  Progress towards OT goals Progressing toward goals  Acute Rehab OT Goals  Patient Stated Goal regain strength and independence  OT Goal Formulation With patient  Time For Goal Achievement 11/07/19  Potential to Achieve Goals Good  ADL Goals  Pt Will Perform Grooming with max assist;sitting;with adaptive equipment  Additional ADL Goal #1 Pt will maintain midline postural control in supported sitting x 5 min with min guard A in preparation for ADL training  Additional ADL Goal #2 Pt will complete sit - stand activity with Max A +2 x 1 min with use of lift equipment  to increase strength for functional mobility  Additional ADL Goal #3 Pt will be able to move Lt UE to mouth with mod A  Additional ADL Goal #4 Pt will demonstrate B active elbow flexion in gravity eliminated position to increase funcitonal use of arms  Pt Will Perform Upper Body Bathing with max assist;sitting  Pt/caregiver will Perform Home Exercise Program Increased strength;Increased ROM;Both right and left upper extremity;With Supervision;With written HEP provided  OT Time Calculation  OT Start Time (ACUTE ONLY) 1500  OT Stop Time (ACUTE ONLY) 1515  OT Time Calculation (min) 15 min  OT General Charges  $OT Visit 1 Visit  OT Treatments  $Therapeutic Exercise 8-22 mins  Maurie Boettcher, OT/L   Acute OT Clinical Specialist [place] Pager [phone] Office [phone]

## 2019-11-05 NOTE — Progress Notes (Addendum)
NAME:  [name], MRN:  [id number], DOB:  1976-01-28, LOS: 106 ADMISSION DATE:  07/22/2019, CONSULTATION DATE:  12/24 REFERRING MD:  Sloan Leiter, CHIEF COMPLAINT:  Dyspnea   Brief History   44 y/o male admitted 12/14 with COVID pneumonia leading to ARDS, pneumomediastinum.  Intubated 12/26, bilateral chest tubes placed.  Past Medical History  GERD HTN Asthma  Significant Hospital Events   12/14 admit with hypoxemic respiratory failure in setting of COVID-19 pneumonia 12/23 noted to have pneumomediastinum on chest x-ray 12/24PCCM consulted for evaluation 12/26 worsening hypoxemia, intubated 12/27 VV fem/fem ECMO cannulation 12/29 tracheostomy >> See bedside nursing event notes for full rundown of procedures after 12/27 1/5 epistaxis, nasal packing 1/30 improved cxr, improved lung compliance, TV 350s on 15PC  1/31 CXR increased infiltrates, +CFB     2/2 Converted to right IJ Crescent cannula. 2/3 PEG tube inserted at bedside 2/14 30 min sweep trial> paO2 105 (18 PEEP, 60% FiO2) pH 7.22, pCO2 87 2/20 2 hours sweep trial 2/21 1.5 hour sweep trial 2/24 7-hour sweep trial.  Increasingly awake. 3/2 - decannulation attempt aborted due to increased hypercapnia when sedated for procedure 3/4- decannulated 3/8 PEG tube repositioned by IR 3/9 > trach wean 3/14 started 24/7 TC 3/23 starting capping trials 3/24 changed to 5 prox xlt cuffless Consults:  PCCM, CTS, CHF, Palliative  Procedures:  PICC 1/3 >> R radial a-line 1/20 > 3/5 R IJ ECMO cath 2/3 > 3/4  Tracheostomy 12/29 >> 10/14/2019 PEG tube replacement>>  Significant Diagnostic Tests:  CT chest 12/22>extensive pneumomediastinum, multifocal patchy bilateral groundglass opacities CXR 1/25 > improving bilateral infiltrates. ECMO cannulae in place. CXR 2/5 > clearing bilateral infiltrates  Micro Data:  BCx2 12/14 >negative  Sputum 12/15 > OPF 12/28 blood > negative 12/29 > staph aureus 1/4 bronch:  1/2 acid fast  smear bronch: negative 1/2 fungal cx bronch: negative1/12 BAL: serratia, staph epidermidis 1/4 BAL > MSSA, Serratia 1/9 blood > negative 1/9 Resp culture > MSSA, serratia 1/11 serratia > negative 1/12 fungus culture > negative 1/12 BAL > serratia 1/18 BAL >> rare Serratia. 1/24 blood > negative 1/30 blood > negative 1/30 resp culture > serratia 2/1 BAL > rare Serratia - enterococcus 2/1 aspergillus Ag > negative 2/2 resp culture > serratia  2/2 blood > negative 2/5 urine > negative 2/9 resp: serratia 2/15 BAL serratia 3/5 blood cultures ngtd  Antimicrobials:  Received remdesivir, steroids, actemra and convalescent plasma  See MAR for remainder  Interim history/subjective:  Tube feeds at night with meals during the day. Eating [place] toast this morning, no issues with cough Awake and alert with PMV in. Tolerating well.  Afebrile>> WBC 6  Objective   Blood pressure 137/75, pulse 91, temperature 97.9 F (36.6 C), temperature source Oral, resp. rate 18, height _0  (1.727 m), weight 117.5 kg, SpO2 99 %.    FiO2 (%):  [98 %-100 %] 100 %   Intake/Output Summary (Last 24 hours) at 11/05/2019 0910 Last data filed at 11/05/2019 0700 Gross per 24 hour  Intake 1580 ml  Output 1700 ml  Net -120 ml   Filed Weights   11/03/19 0545 11/04/19 0700 11/05/19 0600  Weight: 112.4 kg 119 kg 117.5 kg    Examination: General 44 year old white male resting in bed , no acute distress   HEENT normocephalic atraumatic #  5 proximal cuffless XLT in place red capping valve in place excellent phonation Pulmonary: Bilateral chest excursion, No accessory use , few left-sided rhonchi, diminished per  bases Cardiac S1, S2, regular rate and rhythm without murmur rub or gallop Abdomen is soft nontender, ND, BS +. Body mass index is 39.39 kg/m. Extremities are warm and dry, with brick refill, wounds to feet bilaterally per wound nurse notes Neuro awake, oriented, but still diffusely weak.Right side  weaker than left. Resolved Hospital Problem list    ARDS due to COVID 19 Decannulated from VV ECMO on 3/4 after 65 days  Pneumothoraces-resolved. Dislodged PEG tube Indurated area of the skin right lateral PEG tube. Serratia pneumonia> treated.  Hypernatremia  AKI with azotemia  Positive cumulative fluid balance Severe third spacing  Assessment & Plan:  Tracheostomy dependent since 12/29 due to prolonged critical illness after COVID-19 infection Hypertension Neuromuscular weakness Bilateral lower feet deep tissue injury Anemia due to critical illness Insomnia TF-diarrhea > Improved   Discussion Downsized to size 5 cuffless proximal tracheostomy on 3/24,  Tolerating capping trials.   He remains weak,  primary barrier to decannulation( consistent with ICU/steroid/paralytics myopathy) Once strength improves hopeful for decannulation Taking Dysphagia 3 diet during the day, TF at night   Plan Continue current routine tracheostomy care Saturation goals are > 94% Continue capping with continuous pulse oximetry, eventually will get him decannulated but need him stronger  Biggest focus at this point needs to remain on his physical rehabilitation Continue working  with PT/OT Continue to push w/ acute-side PT/OT to maximize strength, bed mobility, OOB to chair. To CIR once strong enough    Magdalen Spatz, MSN, AGACNP-BC [place] Pager # [phone] After 4 pm please call [phone] 11/05/2019, 9:10 AM

## 2019-11-05 NOTE — Progress Notes (Signed)
OT Treatment Note Making excellent progress this session. Increased ability to assist with bed mobility due to increased trunk strength and helping to push trunk into upright sitting with BUE. Denna Haggard used  With Max A +2 to facilitate weight shift forward to transition to semi standing position - able to clear hips @ 8 inches from bed. Mother present during session. Also educated Mom on BUE ROM and strengthening ex. R hand wrapped in composite flexion to increase ROM with IP/ MP joints to increase functional use of hand. Will return to remove ace wrap and work on functional use of R hand. Excellent participation with pt. VSS throughout session on 2L with 1 extension.     11/05/19 1700  OT Visit Information  Last OT Received On 11/05/19  Assistance Needed +2 (+3 for standing)  PT/OT/SLP Co-Evaluation/Treatment Yes  Reason for Co-Treatment Complexity of the patient's impairments (multi-system involvement);For patient/therapist safety;To address functional/ADL transfers  OT goals addressed during session ADL's and self-care;Strengthening/ROM  History of Present Illness 44 y/o M admitted 12/14 with COVID pneumonia causing acute hypoxemic respiratory failure.   Developed pneumomediastinum 12/23 with concern for possible bilateral small pneumothoraces on 12/24. Pt intubated on 12/26 and started on ECMO on 12/27. Tracheostomy on 12/29 with bleeding from trach an dother sites on 12/31. Bronch on 1/11 and 1/15. Slowly weaning sedation 2/5. Continuing to wean sedation and starting sweep trials to wean ECMO - Decannulated 3/4  Precautions  Precautions Fall;Other (comment)  Precaution Comments PEG, trach; bilat foot wounds  Required Braces or Orthoses Other Brace  Other Brace B prevalon boots; R wrist cock-up splint  Pain Assessment  Pain Assessment Faces  Faces Pain Scale 6  Pain Location rt knee with flexion  Pain Descriptors / Indicators Grimacing;Guarding  Pain Intervention(s) Limited activity  within patient's tolerance  Cognition  Arousal/Alertness Awake/alert  Behavior During Therapy Anxious;WFL for tasks assessed/performed  Overall Cognitive Status Impaired/Different from baseline  Area of Impairment Memory;Awareness;Problem solving  Current Attention Level Selective  Memory Decreased short-term memory  Following Commands Follows one step commands consistently  Awareness Emergent  Problem Solving Slow processing  General Comments continues to demonstrate improvement  Lower Extremity Assessment  Lower Extremity Assessment Defer to PT evaluation  ADL  Grooming Maximal assistance;Wash/dry face;Oral care  Functional mobility during ADLs +2 for physical assistance;Maximal assistance (with use of Denna Haggard)  Bed Mobility  Overal bed mobility Needs Assistance  Bed Mobility Rolling;Sidelying to Sit;Sit to Supine  Rolling Max assist  Sidelying to sit +2 for physical assistance;Max assist  Sit to supine Max assist;+2 for physical assistance;+2 for safety/equipment  General bed mobility comments Pt able to assist bringing legs off EOB. Assist to bring legs off of bed, elevate trunk into sitting, and bring hips to EOB.Marland Kitchen Assist to lower trunk and bring legs back up into bed.   Balance  Overall balance assessment Needs assistance  Sitting-balance support Feet supported;Bilateral upper extremity supported  Sitting balance-Leahy Scale Poor  Sitting balance - Comments Sat EOB ~20 minutes with Clarise Cruz Plus with min guard  Postural control  (BUE supported on lift; able to bring self upright)  Restrictions  Weight Bearing Restrictions No  Transfers  Overall transfer level Needs assistance  Equipment used Ambulation equipment used  Transfer via [place] to/from Stand  Sit to Stand +2 physical assistance;Max assist (with 3rd person operating Clarise Cruz Plus)  General transfer comment Used bed pad under hips and rocking motion to work toward standing. Practiced  3-4 times without using lift mechanism of Clarise Cruz Plus to give pt the "feel" for the movement. At this point unable to clear buttocks from bed. Then used sara plus and rocking momentum and lifting with bed pad under hips and able to clear buttocks 3x. Cleared ~ 8", 4", and then 12". Pt with flexed posture over Clarise Cruz Plus.   General Exercises - Upper Extremity  Shoulder Flexion AAROM;AROM;Both;10 reps;Supine  Shoulder ABduction AAROM;AROM;Both;15 reps;Supine  Elbow Flexion AAROM;PROM;Both;15 reps;Seated  Elbow Extension AROM;Both;15 reps;Supine (against gravity and resistance)  Wrist Flexion Both;15 reps;Seated;AROM  Wrist Extension Both;15 reps;PROM;AROM;AAROM;Supine  Digit Composite Flexion AROM;AAROM;Both;15 reps;Seated  Composite Extension AAROM;PROM;Both;15 reps;Supine  Other Exercises  Other Exercises Mom presetn for session and taught elbow flex/ext exercises as well as assisting with "push ups"  - assisting into shoulder flw=exion; composite flexion/extension; wrist extension with gravity eliminated  Other Exercises heat to R hand, wrapped in composite flexion for continuous passive stretch followed by composite flexion/extension   OT - End of Session  Equipment Utilized During Treatment Oxygen (2L with 1 extension)  Activity Tolerance Patient tolerated treatment well  Patient left in bed;with call bell/phone within reach;with family/visitor present  Nurse Communication Mobility status;Need for lift equipment;Other (comment) (BUE positioning)  OT Assessment/Plan  OT Plan Discharge plan remains appropriate  OT Visit Diagnosis Muscle weakness (generalized) (M62.81);Other abnormalities of gait and mobility (R26.89);Other symptoms and signs involving cognitive function;Pain  Pain - Right/Left Right  Pain - part of body Shoulder;Knee  OT Frequency (ACUTE ONLY) Min 3X/week  Recommendations for Other Services Rehab consult  Follow Up Recommendations CIR;Supervision/Assistance - 24 hour  OT  Equipment Other (comment) (TBA)  AM-PAC OT "6 Clicks" Daily Activity Outcome Measure (Version 2)  Help from another person eating meals? 1  Help from another person taking care of personal grooming? 2  Help from another person toileting, which includes using toliet, bedpan, or urinal? 1  Help from another person bathing (including washing, rinsing, drying)? 1  Help from another person to put on and taking off regular upper body clothing? 1  Help from another person to put on and taking off regular lower body clothing? 1  6 Click Score 7  OT Goal Progression  Progress towards OT goals Progressing toward goals  Acute Rehab OT Goals  Patient Stated Goal regain strength and independence  OT Goal Formulation With patient  Time For Goal Achievement 11/07/19  Potential to Achieve Goals Good  ADL Goals  Pt Will Perform Grooming sitting;with mod assist  Additional ADL Goal #1 Pt will maintain midline postural control in supported sitting x 5 min with min guard A in preparation for ADL training  Additional ADL Goal #2 Pt will complete sit - stand activity with Max A +2 x 1 min with use of lift equipment  to increase strength for functional mobility  Additional ADL Goal #3 Pt will be able to move Lt UE to mouth with mod A  Additional ADL Goal #4 Pt will demonstrate B active elbow flexion in gravity eliminated position to increase funcitonal use of arms  OT Time Calculation  OT Start Time (ACUTE ONLY) 1335  OT Stop Time (ACUTE ONLY) 1425  OT Time Calculation (min) 50 min  OT General Charges  $OT Visit 1 Visit  OT Treatments  $Neuromuscular Re-education 8-22 mins  $Therapeutic Exercise 8-22 mins  Maurie Boettcher, OT/L   Acute OT Clinical Specialist [place] Pager [phone] Office [phone]

## 2019-11-05 NOTE — Progress Notes (Signed)
Inpatient Rehabilitation Admissions Coordinator  Noted progress with therapies. Noted use of Sara Plus. I continue to follow.  Danne [name], RN, MSN Rehab Admissions Coordinator [phone] 11/05/2019 4:29 PM

## 2019-11-05 NOTE — Progress Notes (Signed)
Physical Therapy Treatment Patient Details Name: [name] MRN: [id number] DOB: July 16, 1976 Today's Date: 11/05/2019    History of Present Illness 44 y/o M admitted 12/14 with COVID pneumonia causing acute hypoxemic respiratory failure.   Developed pneumomediastinum 12/23 with concern for possible bilateral small pneumothoraces on 12/24. Pt intubated on 12/26 and started on ECMO on 12/27. Tracheostomy on 12/29 with bleeding from trach an dother sites on 12/31. Bronch on 1/11 and 1/15. Slowly weaning sedation 2/5. Continuing to wean sedation and starting sweep trials to wean ECMO - Decannulated 3/4    PT Comments    Pt able to get partially standing with Clarise Cruz Plus. Pt continues to make steady progress with all mobility and continue to recommend CIR.   Follow Up Recommendations  CIR;Supervision/Assistance - 24 hour     Equipment Recommendations  Other (comment)(To be determined)    Recommendations for Other Services       Precautions / Restrictions Precautions Precautions: Fall;Other (comment) Precaution Comments: PEG, trach; bilat foot wounds Required Braces or Orthoses: Other Brace Other Brace: B prevalon boots; R wrist cock-up splint Restrictions Weight Bearing Restrictions: No    Mobility  Bed Mobility Overal bed mobility: Needs Assistance Bed Mobility: Rolling;Sidelying to Sit;Sit to Supine Rolling: Max assist Sidelying to sit: +2 for physical assistance;Max assist   Sit to supine: Max assist;+2 for physical assistance;+2 for safety/equipment   General bed mobility comments: Pt able to assist bringing legs off EOB. Assist to bring legs off of bed, elevate trunk into sitting, and bring hips to EOB.Marland Kitchen Assist to lower trunk and bring legs back up into bed.   Transfers Overall transfer level: Needs assistance Equipment used: Ambulation equipment used Transfers: Sit to/from Stand Sit to Stand: +2 physical assistance;Max assist(with 3rd person operating Clarise Cruz Plus)          General transfer comment: Used bed pad under hips and rocking motion to work toward standing. Practiced 3-4 times without using lift mechanism of Clarise Cruz Plus to give pt the "feel" for the movement. At this point unable to clear buttocks from bed. Then used sara plus and rocking momentum and lifting with bed pad under hips and able to clear buttocks 3x. Cleared ~ 8", 4", and then 12". Pt with flexed posture over Clarise Cruz Plus.   Ambulation/Gait                 Stairs             Wheelchair Mobility    Modified Rankin (Stroke Patients Only)       Balance Overall balance assessment: Needs assistance Sitting-balance support: Feet supported;Bilateral upper extremity supported Sitting balance-Leahy Scale: Poor Sitting balance - Comments: Sat EOB ~20 minutes with Clarise Cruz Plus with min guard Postural control: Posterior lean                                  Cognition Arousal/Alertness: Awake/alert Behavior During Therapy: Anxious;WFL for tasks assessed/performed Overall Cognitive Status: Impaired/Different from baseline Area of Impairment: Memory;Awareness;Problem solving                   Current Attention Level: Selective Memory: Decreased short-term memory Following Commands: Follows one step commands consistently     Problem Solving: Slow processing        Exercises      General Comments        Pertinent Vitals/Pain Pain Assessment: Faces Faces Pain Scale: Hurts  even more Pain Location: rt knee with flexion Pain Descriptors / Indicators: Grimacing;Guarding Pain Intervention(s): Limited activity within patient's tolerance;Monitored during session;Repositioned    Home Living                      Prior Function            PT Goals (current goals can now be found in the care plan section) Acute Rehab PT Goals Patient Stated Goal: regain strength and independence Progress towards PT goals: Progressing toward goals     Frequency    Min 3X/week      PT Plan Current plan remains appropriate    Co-evaluation PT/OT/SLP Co-Evaluation/Treatment: Yes Reason for Co-Treatment: Complexity of the patient's impairments (multi-system involvement);For patient/therapist safety PT goals addressed during session: Mobility/safety with mobility;Balance        AM-PAC PT "6 Clicks" Mobility   Outcome Measure  Help needed turning from your back to your side while in a flat bed without using bedrails?: Total Help needed moving from lying on your back to sitting on the side of a flat bed without using bedrails?: Total Help needed moving to and from a bed to a chair (including a wheelchair)?: Total Help needed standing up from a chair using your arms (e.g., wheelchair or bedside chair)?: Total Help needed to walk in hospital room?: Total Help needed climbing 3-5 steps with a railing? : Total 6 Click Score: 6    End of Session Equipment Utilized During Treatment: Oxygen Activity Tolerance: Patient tolerated treatment well Patient left: in bed;with call bell/phone within reach;with family/visitor present Nurse Communication: Mobility status;Need for lift equipment PT Visit Diagnosis: Muscle weakness (generalized) (M62.81);Other symptoms and signs involving the nervous system (R29.898);Other abnormalities of gait and mobility (R26.89)     Time: 1330-1408 PT Time Calculation (min) (ACUTE ONLY): 38 min  Charges:  $Therapeutic Activity: 8-22 mins                     [place] Pager [phone] Office [place] 11/05/2019, 3:49 PM

## 2019-11-06 LAB — GLUCOSE, CAPILLARY
Glucose-Capillary: 115 mg/dL — ABNORMAL HIGH (ref 70–99)
Glucose-Capillary: 115 mg/dL — ABNORMAL HIGH (ref 70–99)
Glucose-Capillary: 121 mg/dL — ABNORMAL HIGH (ref 70–99)
Glucose-Capillary: 126 mg/dL — ABNORMAL HIGH (ref 70–99)
Glucose-Capillary: 128 mg/dL — ABNORMAL HIGH (ref 70–99)

## 2019-11-06 LAB — BASIC METABOLIC PANEL
Anion gap: 11 (ref 5–15)
BUN: 50 mg/dL — ABNORMAL HIGH (ref 6–20)
CO2: 30 mmol/L (ref 22–32)
Calcium: 9.3 mg/dL (ref 8.9–10.3)
Chloride: 95 mmol/L — ABNORMAL LOW (ref 98–111)
Creatinine, Ser: 0.74 mg/dL (ref 0.61–1.24)
GFR calc Af Amer: >60 mL/min (ref >60)
GFR calc non Af Amer: >60 mL/min (ref >60)
Glucose, Bld: 141 mg/dL — ABNORMAL HIGH (ref 70–99)
Potassium: 3.8 mmol/L (ref 3.5–5.1)
Sodium: 136 mmol/L (ref 135–145)

## 2019-11-06 LAB — CBC
HCT: 37.1 % — ABNORMAL LOW (ref 39.0–52.0)
Hemoglobin: 11.3 g/dL — ABNORMAL LOW (ref 13.0–17.0)
MCH: 27.8 pg (ref 26.0–34.0)
MCHC: 30.5 g/dL (ref 30.0–36.0)
MCV: 91.4 fL (ref 80.0–100.0)
Platelets: 102 10*3/uL — ABNORMAL LOW (ref 150–400)
RBC: 4.06 MIL/uL — ABNORMAL LOW (ref 4.22–5.81)
RDW: 15.8 % — ABNORMAL HIGH (ref 11.5–15.5)
WBC: 6.8 10*3/uL (ref 4.0–10.5)
nRBC: 0 % (ref 0.0–0.2)

## 2019-11-06 MED ORDER — POTASSIUM CHLORIDE 20 MEQ/15ML (10%) PO SOLN
20.0000 meq | Freq: Every day | ORAL | Status: DC
  Administered 2019-11-06: 20 meq
  Filled 2019-11-06 (×2): qty 15

## 2019-11-06 NOTE — Progress Notes (Signed)
Physical Therapy Treatment Patient Details Name: [name] MRN: [id number] DOB: 18-Nov-1975 Today's Date: 11/06/2019    History of Present Illness 44 y/o M admitted 12/14 with COVID pneumonia causing acute hypoxemic respiratory failure.   Developed pneumomediastinum 12/23 with concern for possible bilateral small pneumothoraces on 12/24. Pt intubated on 12/26 and started on ECMO on 12/27. Tracheostomy on 12/29 with bleeding from trach an dother sites on 12/31. Bronch on 1/11 and 1/15. Slowly weaning sedation 2/5. Continuing to wean sedation and starting sweep trials to wean ECMO - Decannulated 3/4    PT Comments    Pt admitted with above diagnosis. Pt was able to stand with sara plus with total assist of 3-4 people to achieve upright postiion.  Pt able to stand more fully upright today and for up to 7 seconds.  PRogressing well.  Pt currently with functional limitations due to balance and endurance deficits. Pt will benefit from skilled PT to increase their independence and safety with mobility to allow discharge to the venue listed below.     Follow Up Recommendations  CIR;Supervision/Assistance - 24 hour     Equipment Recommendations  Other (comment)(To be determined)    Recommendations for Other Services OT consult     Precautions / Restrictions Precautions Precautions: Fall;Other (comment) Precaution Comments: PEG, trach; bilat foot wounds Required Braces or Orthoses: Other Brace Other Brace: B prevalon boots; R wrist cock-up splint Restrictions Weight Bearing Restrictions: No RLE Weight Bearing: Non weight bearing LLE Weight Bearing: Non weight bearing    Mobility  Bed Mobility Overal bed mobility: Needs Assistance Bed Mobility: Rolling;Sidelying to Sit;Sit to Supine Rolling: Max assist Sidelying to sit: +2 for physical assistance;Max assist Supine to sit: Max assist;+2 for physical assistance;+2 for safety/equipment Sit to supine: Max assist;+2 for physical  assistance;+2 for safety/equipment   General bed mobility comments: Pt able to assist bringing legs off EOB. Assist to bring legs off of bed, elevate trunk into sitting, and bring hips to EOB.Marland Kitchen Assist to lower trunk and bring legs back up into bed.   Transfers Overall transfer level: Needs assistance Equipment used: Ambulation equipment used Transfers: Sit to/from Stand Sit to Stand: +2 physical assistance;Max assist(with 3rd person operating Clarise Cruz Plus)         General transfer comment: Used bed pad under hips and rocking motion to work toward standing. Practiced 3-4 times without using lift mechanism of Clarise Cruz Plus to give pt the "feel" for the movement. At this point unable to clear buttocks from bed. Then used sara plus and rocking momentum and lifting with bed pad under hips and able to clear buttocks 6x. Cleared ~ 3", 6", 10" and then 12". Got 4th person and with one person driving the lift, 2 persons on either side lifting pad and the 4th person behind pt pushing up buttocks, pt in full stance x 2 x for close to 7 seconds the second time. Pt with more upright posture overall.   Ambulation/Gait             General Gait Details: did not attempt ambulation yet   Stairs             Wheelchair Mobility    Modified Rankin (Stroke Patients Only)       Balance Overall balance assessment: Needs assistance Sitting-balance support: Feet supported;Bilateral upper extremity supported Sitting balance-Leahy Scale: Poor Sitting balance - Comments: Sat EOB ~30 minutes with Clarise Cruz Plus with min guard Postural control: (BUE supported on lift; able to bring  self upright) Standing balance support: Bilateral upper extremity supported;During functional activity Standing balance-Leahy Scale: Zero Standing balance comment: Clarise Cruz plus to stand with pt coming close to fully upright with assist with pad to maintain buttock tuck.                              Cognition  Arousal/Alertness: Awake/alert Behavior During Therapy: Anxious;WFL for tasks assessed/performed Overall Cognitive Status: Impaired/Different from baseline Area of Impairment: Memory;Awareness;Problem solving                   Current Attention Level: Selective Memory: Decreased short-term memory Following Commands: Follows one step commands consistently Safety/Judgement: Decreased awareness of safety;Decreased awareness of deficits Awareness: Emergent Problem Solving: Slow processing General Comments: continues to demonstrate improvement      Exercises General Exercises - Lower Extremity Quad Sets: AROM;Both;10 reps;Supine Long Arc Quad: AAROM;Both;Seated;5 reps Heel Slides: AAROM;10 reps;Supine;Both Straight Leg Raises: AAROM;Both;5 reps;Supine    General Comments General comments (skin integrity, edema, etc.): VSS      Pertinent Vitals/Pain Pain Assessment: Faces Faces Pain Scale: Hurts little more Pain Location: with R digit ROM Pain Descriptors / Indicators: Grimacing;Guarding Pain Intervention(s): Limited activity within patient's tolerance;Monitored during session;Repositioned    Home Living                      Prior Function            PT Goals (current goals can now be found in the care plan section) Acute Rehab PT Goals Patient Stated Goal: regain strength and independence Progress towards PT goals: Progressing toward goals    Frequency    Min 3X/week      PT Plan Current plan remains appropriate    Co-evaluation PT/OT/SLP Co-Evaluation/Treatment: Yes Reason for Co-Treatment: Complexity of the patient's impairments (multi-system involvement);For patient/therapist safety PT goals addressed during session: Mobility/safety with mobility;Strengthening/ROM        AM-PAC PT "6 Clicks" Mobility   Outcome Measure  Help needed turning from your back to your side while in a flat bed without using bedrails?: Total Help needed moving  from lying on your back to sitting on the side of a flat bed without using bedrails?: Total Help needed moving to and from a bed to a chair (including a wheelchair)?: Total Help needed standing up from a chair using your arms (e.g., wheelchair or bedside chair)?: Total Help needed to walk in hospital room?: Total Help needed climbing 3-5 steps with a railing? : Total 6 Click Score: 6    End of Session Equipment Utilized During Treatment: Oxygen Activity Tolerance: Patient tolerated treatment well Patient left: in bed;with call bell/phone within reach Nurse Communication: Mobility status;Need for lift equipment PT Visit Diagnosis: Muscle weakness (generalized) (M62.81);Other symptoms and signs involving the nervous system (R29.898);Other abnormalities of gait and mobility (R26.89)     Time: 9379-0240 PT Time Calculation (min) (ACUTE ONLY): 43 min  Charges:  $Therapeutic Exercise: 8-22 mins $Therapeutic Activity: 8-22 mins                     Jordyn Hofacker W,PT Acute Rehabilitation Services Pager:  [phone]  Office:  [place] 11/06/2019, 2:36 PM

## 2019-11-06 NOTE — Progress Notes (Signed)
Occupational Therapy Treatment Patient Details Name: [name] MRN: [id number] DOB: 1975/10/09 Today's Date: 11/06/2019    History of present illness 44 y/o M admitted 12/14 with COVID pneumonia causing acute hypoxemic respiratory failure.   Developed pneumomediastinum 12/23 with concern for possible bilateral small pneumothoraces on 12/24. Pt intubated on 12/26 and started on ECMO on 12/27. Tracheostomy on 12/29 with bleeding from trach an dother sites on 12/31. Bronch on 1/11 and 1/15. Slowly weaning sedation 2/5. Continuing to wean sedation and starting sweep trials to wean ECMO - Decannulated 3/4   OT comments  Pt continues to make steady progress with each treatment session. Music using during session today with pt moving to the beat of the music. Able to sit EOB at least 30 min with minguard A. Seen as cotreat with PT with use of Sara Plus to facilitate standing. Able to achieve upright standing position x 7 seconds with +2 Max assist to transition hips anteriorly using bed pad and 3rd person to help tuck buttocks. Initiated self feeding today - pt will benefit from use of AE. Pt left completing BUE exercise on table with pillow cases to reduce friction. Encouraged squeeze ball and theraputty. Pt very appreciative and encouraged with his progress with rehab.   Follow Up Recommendations  CIR;Supervision/Assistance - 24 hour    Equipment Recommendations  Other (comment)(TBA)    Recommendations for Other Services Rehab consult    Precautions / Restrictions Precautions Precautions: Fall;Other (comment) Precaution Comments: PEG, trach; bilat foot wounds Required Braces or Orthoses: Other Brace Other Brace: B prevalon boots; R wrist cock-up splint Restrictions Weight Bearing Restrictions: No       Mobility Bed Mobility Overal bed mobility: Needs Assistance Bed Mobility: Rolling;Sidelying to Sit;Sit to Supine Rolling: Max assist Sidelying to sit: +2 for physical assistance;Max  assist Supine to sit: Max assist;+2 for physical assistance;+2 for safety/equipment Sit to supine: Max assist;+2 for physical assistance;+2 for safety/equipment   General bed mobility comments: Pt assiting wtih moving BLE off bed; Mod A to roll into sidelying and then using BUE to help push up into upright sitting with +2 Mod A for safety  Transfers Overall transfer level: Needs assistance Equipment used: Ambulation equipment used Transfers: Sit to/from Stand Sit to Stand: +2 physical assistance;Max assist(with 3rd person operating Clarise Cruz Plus)         General transfer comment: Used bed pad under hips and rocking motion to work toward standing. Practiced 3-4 times without using lift mechanism of Clarise Cruz Plus to give pt the "feel" for the movement. At this point unable to clear buttocks from bed. Then used sara plus and rocking momentum and lifting with bed pad under hips and able to clear buttocks 6x. Cleared ~ 3", 6", 10" and then 12". Got 4th person and with one person driving the lift, 2 persons on either side lifting pad and the 4th person behind pt pushing up buttocks, pt in full stance x 2 x for close to 7 seconds the second time. Pt with more upright posture overall. Increased control of knees in lift adn less complaints of pain today.    Balance Overall balance assessment: Needs assistance Sitting-balance support: Feet supported;Bilateral upper extremity supported Sitting balance-Leahy Scale: Poor Sitting balance - Comments: Sat EOB ~30 minutes with Clarise Cruz Plus with min guard Postural control: (BUE supported on lift; able to bring self upright) Standing balance support: Bilateral upper extremity supported;During functional activity Standing balance-Leahy Scale: Zero Standing balance comment: Clarise Cruz plus to stand with pt  coming close to fully upright with assist with pad to maintain buttock tuck.                             ADL either performed or assessed with clinical judgement    ADL Overall ADL's : Needs assistance/impaired Eating/Feeding: Maximal assistance Eating/Feeding Details (indicate cue type and reason): Hand over hand assist with pt initiating scoop to load utensil. ablet o hold utnesil howeer will benefit form use of built up tubing; will most likely benefit from lidded /handle cup                                         Vision       Perception     Praxis      Cognition Arousal/Alertness: Awake/alert Behavior During Therapy: Anxious;WFL for tasks assessed/performed Overall Cognitive Status: Impaired/Different from baseline Area of Impairment: Memory;Awareness;Problem solving                   Current Attention Level: Selective Memory: Decreased short-term memory Following Commands: Follows one step commands consistently Safety/Judgement: Decreased awareness of deficits Awareness: Emergent Problem Solving: Slow processing General Comments: continues to demonstrate improvement        Exercises General Exercises - Upper Extremity Shoulder Flexion: AROM;AAROM;20 reps;Supine Shoulder Extension: AROM;AAROM;Strengthening;Both;20 reps;Supine Shoulder ABduction: AROM;AAROM;Both;15 reps;Supine Shoulder ADduction: AROM;AAROM;Strengthening;Both;15 reps;Supine Elbow Flexion: PROM;AROM;AAROM;15 reps;Seated;Supine Elbow Extension: AROM;AAROM;Both;Strengthening;15 reps;Supine Wrist Flexion: AROM;Strengthening;Both;15 reps;Seated Wrist Extension: PROM;AROM;AAROM;Both;15 reps;Seated Digit Composite Flexion: AROM;AAROM;PROM;Both;15 reps;Seated;Squeeze ball Composite Extension: PROM;AROM;AAROM;Strengthening;Both;15 reps  Other Exercises Other Exercises: table top exercise  - sheet used for "wax on /wax off" exericse to incorporate shoulder adn elbow strengthening -pt ableto add/abd shoulder adn extend elbow; Pt wih min elbow flexion using this position Other Exercises: theraputty B hands - pt ableto bring hands together at  midline to excahnge putty from R<>L hand PNF supine exercises reaching across midline to facilitate obliques  Shoulder Instructions       General Comments VSS on 2L    Pertinent Vitals/ Pain       Pain Assessment: Faces Faces Pain Scale: Hurts little more Pain Location: with R digit ROM; B shoulders @ 90 FF Pain Descriptors / Indicators: Grimacing;Guarding Pain Intervention(s): Limited activity within patient's tolerance  Home Living                                          Prior Functioning/Environment              Frequency  Min 3X/week        Progress Toward Goals  OT Goals(current goals can now be found in the care plan section)  Progress towards OT goals: Progressing toward goals  Acute Rehab OT Goals Patient Stated Goal: regain strength and independence OT Goal Formulation: With patient Time For Goal Achievement: 11/20/19 Potential to Achieve Goals: Good ADL Goals Pt Will Perform Eating: with mod assist;with adaptive utensils;sitting Pt Will Perform Grooming: with max assist;sitting;with adaptive equipment Pt Will Perform Upper Body Bathing: with max assist;sitting Pt/caregiver will Perform Home Exercise Program: Increased strength;Increased ROM;Both right and left upper extremity;With Supervision;With written HEP provided Additional ADL Goal #1: Pt will maintain midline postural control in supported sitting x 5 min with min guard A in preparation  for ADL training Additional ADL Goal #2: Pt will complete sit - stand activity with Max A +2 x 1 min with use of lift equipment  to increase strength for functional mobility Additional ADL Goal #3: Pt will be able to move Lt UE to mouth with mod A Additional ADL Goal #4: Pt will demonstrate B active elbow flexion in gravity eliminated position to increase funcitonal use of arms  Plan Discharge plan remains appropriate    Co-evaluation    PT/OT/SLP Co-Evaluation/Treatment: Yes Reason for  Co-Treatment: Complexity of the patient's impairments (multi-system involvement);For patient/therapist safety;To address functional/ADL transfers   OT goals addressed during session: ADL's and self-care;Strengthening/ROM      AM-PAC OT "6 Clicks" Daily Activity     Outcome Measure   Help from another person eating meals?: A Lot Help from another person taking care of personal grooming?: A Lot Help from another person toileting, which includes using toliet, bedpan, or urinal?: Total Help from another person bathing (including washing, rinsing, drying)?: Total Help from another person to put on and taking off regular upper body clothing?: Total Help from another person to put on and taking off regular lower body clothing?: Total 6 Click Score: 8    End of Session Equipment Utilized During Treatment: Oxygen;Other (comment)(Sars Plus)  OT Visit Diagnosis: Muscle weakness (generalized) (M62.81);Other abnormalities of gait and mobility (R26.89);Other symptoms and signs involving cognitive function;Pain Pain - Right/Left: Right Pain - part of body: Hand(B shoulders)   Activity Tolerance Patient tolerated treatment well   Patient Left in bed;with call bell/phone within reach(chair position)   Nurse Communication Mobility status;Other (comment)(encourage exericses)        Time: 4920-1007 OT Time Calculation (min): 60 min  Charges: OT General Charges $OT Visit: 1 Visit OT Treatments $Self Care/Home Management : 8-22 mins $Neuromuscular Re-education: 8-22 mins  Maurie Boettcher, OT/L   Acute OT Clinical Specialist [place] Pager [phone] Office [phone]    [facility] 11/06/2019, 6:32 PM

## 2019-11-06 NOTE — Progress Notes (Addendum)
Patient ID: [name], male   DOB: [birth year], 44 y.o.   MRN: [id number]    Advanced Heart Failure Rounding Note   Subjective:    Remains incontinent. Requires assistance with meals.  Requires frequent repositioning.   Denies SOB. Hungry today.  Objective:   Weight Range:  Vital Signs:   Temp:  [97.8 F (36.6 C)-98.6 F (37 C)] 98.1 F (36.7 C) (03/31 1214) Pulse Rate:  [89-98] 91 (03/31 1519) Resp:  [14-23] 16 (03/31 1519) BP: (108-169)/(41-95) 127/72 (03/31 1519) SpO2:  [90 %-100 %] 100 % (03/31 1519) Weight:  [118.2 kg] 118.2 kg (03/31 0500) Last BM Date: 11/06/19  Weight change: Filed Weights   11/04/19 0700 11/05/19 0600 11/06/19 0500  Weight: 119 kg 117.5 kg 118.2 kg    Intake/Output:   Intake/Output Summary (Last 24 hours) at 11/06/2019 1649 Last data filed at 11/06/2019 1200 Gross per 24 hour  Intake 1659.91 ml  Output 1250 ml  Net 409.91 ml     PHYSICAL EXAM: General:  In bed. . No resp difficulty HEENT: normal anicteric  Neck: Trach, supple. no JVD. Carotids 2+ bilat; no bruits. No lymphadenopathy or thryomegaly appreciated. Cor: PMI nondisplaced. Regular rate & rhythm. No rubs, gallops or murmurs. Lungs: clear no wheeze Abdomen: obese soft, nontender, nondistended. No hepatosplenomegaly. No bruits or masses. Good bowel sounds. + Peg  Extremities: no cyanosis, clubbing, rash, edema. RUE/LUE splints Neuro: alert & orientedx3, cranial nerves grossly intact. extremities very weak R>L Affect pleasant   Telemetry: SR 90-100s personally    Labs: Basic Metabolic Panel: Recent Labs  Lab 11/01/19 0225 11/01/19 0225 11/03/19 0509 11/05/19 0302 11/06/19 0355  NA 143  --  140 138 136  K 3.9  --  4.2 4.2 3.8  CL 101  --  100 98 95*  CO2 33*  --  30 31 30   GLUCOSE 115*  --  99 109* 141*  BUN 43*  --  41* 46* 50*  CREATININE 0.53*  --  0.54* 0.60* 0.74  CALCIUM 9.3   < > 9.6 9.6 9.3  MG 1.8  --  1.7 1.7  --   PHOS 4.7*  --  4.8* 4.7*  --     < > = values in this interval not displayed.    Liver Function Tests: No results for input(s): AST, ALT, ALKPHOS, BILITOT, PROT, ALBUMIN in the last 168 hours. No results for input(s): LIPASE, AMYLASE in the last 168 hours. No results for input(s): AMMONIA in the last 168 hours.  CBC: Recent Labs  Lab 11/01/19 0225 11/03/19 0509 11/05/19 0302 11/06/19 0355  WBC 6.7 6.1 6.7 6.8  HGB 11.2* 11.5* 11.7* 11.3*  HCT 37.5* 38.1* 38.3* 37.1*  MCV 94.2 92.7 91.0 91.4  PLT 125* 113* 109* 102*    Cardiac Enzymes: No results for input(s): CKTOTAL, CKMB, CKMBINDEX, TROPONINI in the last 168 hours.  BNP: BNP (last 3 results) Recent Labs    07/22/19 1039  BNP 15.3    ProBNP (last 3 results) No results for input(s): PROBNP in the last 8760 hours.    Other results:  Imaging: No results found.   Medications:     Scheduled Medications: . amLODipine  10 mg Per Tube Daily  . vitamin C  500 mg Per Tube Daily  . carvedilol  25 mg Per Tube BID WC  . chlorhexidine  15 mL Mouth Rinse BID  . Chlorhexidine Gluconate Cloth  6 each Topical Daily  . clonazepam  1 mg  Per Tube QHS  . doxazosin  1 mg Per Tube QHS  . enoxaparin (LOVENOX) injection  60 mg Subcutaneous Q24H  . escitalopram  20 mg Per Tube Daily  . feeding supplement (ENSURE ENLIVE)  237 mL Oral BID BM  . feeding supplement (OSMOLITE 1.5 CAL)  1,000 mL Per Tube Q24H  . feeding supplement (PRO-STAT SUGAR FREE 64)  60 mL Per Tube QID  . Gerhardt's butt cream   Topical Daily  . insulin aspart  0-15 Units Subcutaneous Q6H  . mouth rinse  15 mL Mouth Rinse q12n4p  . melatonin  6 mg Per Tube QHS  . montelukast  10 mg Per Tube QHS  . nutrition supplement (JUVEN)  1 packet Per Tube BID BM  . pantoprazole sodium  40 mg Per Tube BID  . polycarbophil  625 mg Oral Daily  . potassium chloride  20 mEq Per Tube Daily  . psyllium  1 packet Oral TID  . sodium chloride flush  10-40 mL Intracatheter Q12H  . spironolactone  25 mg Oral  Daily  . traZODone  100 mg Per Tube QHS    Infusions: . sodium chloride Stopped (11/06/19 0214)    PRN Medications: sodium chloride, acetaminophen, albuterol, calcium carbonate, [COMPLETED] diazepam **FOLLOWED BY** diazepam, hydrALAZINE, HYDROmorphone, ipratropium-albuterol, loperamide HCl, ondansetron (ZOFRAN) IV, sodium chloride, sodium chloride flush   Assessment/Plan:    1. Acute hypoxic/hypercarbic respiratory failure due to COVID PNA: Remained markedly hypoxic despite full support. Intubated 12/26. S/p bilateral CTs 12/26 for pneumothoraces.  TEE on 12/26 LVEF 70% RV ok. VV ECMO begun 12/26. Tracheostomy 12/29.  COVID ARDS at this point. ECMO circuit changed 1/8 & 1/23. Crescent cannula placed 2/2 (femoral cannuals removed) - Decannulated 3/4 - Still w/ trach collar but currently on National. O2 sats stable.  - PCCM following and have downsized trach. Feel that ongoing weakness is biggest barrier to decannulation. Continue trach trials No change today - Continue exercises with PT/OT.  - Remains very weak PT/OT working with patient every other day. ICU nurses also doing exercises with him 2x/day - Requires nurses to feed him every meal and frequent repositioning  2. COVID PNA: management of COVID infection per CCM. CXR with bilateral multifocal PNA progressing to likely ARDS.  - s/p 10 days of remdesivir - 12/16, 12/2 s/p tociluzimab - 12/17 s/p convalescent plasma - Decadron completed 1/11 - Resolved  3. FEN - Remains on TFs. Loose stools improved with change in regimen - Now on D3 diet. stable - Continue PPI.   4. Hypernatremia -Sodium 136   5. DTIs - On both feet.  Improving  6.  Hypokalemia. - K 3.8   7.  Diffuse muscle weakness. -This is consistent with ICU/steroid/paralytics myopathy.  Continue passive range of motion exercises with PT.   -PT and OT are following.  Both have recommended CIR.  CIR following closley and feels he is not ready yet. Continue to push w/  acute-side PT/OT to maximize strength, bed mobility.  - continue OT/PT. Appreciate all the assistance - Remains very weak PT/OT working with patient every other day. ICU nurses also doing exercises with him 2x/day - Requires nurses to feed him every meal and frequent repositioning - CIR following.   8. HTN Stable.   10. Hypomag - mag 1.7 will supp again   11. Urinary retention - Improved. Continue Flomax   Length of Stay: I[place] NP_C  11/06/2019, 4:49 PM  Advanced Heart Failure Team Pager [phone] (M-F; 7a -  4p)  Please contact [facility] for night-coverage after hours (4p -7a ) and weekends on [email or web address]  Patient seen and examined with the above-signed Advanced Practice Provider and/or Housestaff. I personally reviewed laboratory data, imaging studies and relevant notes. I independently examined the patient and formulated the important aspects of the plan. I have edited the note to reflect any of my changes or salient points. I have personally discussed the plan with the patient and/or family.  Continue to require a high level of assistance due to his weakness but working with OT/PT and RNs. Continue to get stronger slowly. Vitals ok.   Continue current plan.   Glori Bickers, MD  5:08 PM

## 2019-11-06 NOTE — Progress Notes (Addendum)
NAME:  EDWEN MCLESTER, MRN:  [id number], DOB:  09-07-1975, LOS: 61 ADMISSION DATE:  07/22/2019, CONSULTATION DATE:  12/24 REFERRING MD:  Sloan Leiter, CHIEF COMPLAINT:  Dyspnea   Brief History   44 y/o male admitted 12/14 with COVID pneumonia leading to ARDS, pneumomediastinum.  Intubated 12/26, bilateral chest tubes placed.  Prolonged ICU course requiring ventilation support, ECMO.    Past Medical History  GERD HTN Asthma  Significant Hospital Events   12/14 admit with hypoxemic respiratory failure in setting of COVID-19 pneumonia 12/23 noted to have pneumomediastinum on chest x-ray 12/24PCCM consulted for evaluation 12/26 worsening hypoxemia, intubated 12/27 VV fem/fem ECMO cannulation. See nursing event notes for rundown of procedures after 12/27 12/29 Tracheostomy placed  1/05 epistaxis, nasal packing 1/30 improved cxr, improved lung compliance, TV 350s on 15PC  1/31 CXR increased infiltrates, +CFB     2/02 Converted to right IJ Crescent cannula. 2/03 PEG tube inserted at bedside 2/14 30 min sweep trial > paO2 105 (18 PEEP, 60% FiO2) pH 7.22, pCO2 87 2/20 2 hours sweep trial 2/21 1.5 hour sweep trial 2/24 7-hour sweep trial.  Increasingly awake. 3/02 Decannulation attempt aborted due to increased hypercapnia when sedated for procedure 3/04 Decannulated 3/8 PEG tube repositioned by IR 3/9 Trach wean 3/14 started 24/7 TC 3/23 starting capping trials 3/24 changed to 5 prox xlt cuffless 3/30 TF at night with daytime meals, tolerating PMV   Consults:  PCCM, CTS, CHF, Palliative  Procedures:  PICC 1/3 >> R radial a-line 1/20 > 3/5 R IJ ECMO cath 2/3 > 3/4  Tracheostomy 12/29 >> PEG tube replacement 3/28 >>  Significant Diagnostic Tests:  CT chest 12/22>extensive pneumomediastinum, multifocal patchy bilateral groundglass opacities CXR 1/25 > improving bilateral infiltrates. ECMO cannulae in place. CXR 2/5 > clearing bilateral infiltrates  Micro Data:  BCx2 12/14  >negative  Sputum 12/15 > OPF 12/28 blood > negative 12/29 > staph aureus 1/04 bronch 1/02 acid fast smear bronch: negative 1/02 fungal cx bronch: negative1/12 BAL: serratia, staph epidermidis 1/04 BAL > MSSA, Serratia 1/09 blood > negative 1/09 Resp culture > MSSA, serratia 1/11 serratia > negative 1/12 fungus culture > negative 1/12 BAL > serratia 1/18 BAL >> rare Serratia. 1/24 blood > negative 1/30 blood > negative 1/30 resp culture > serratia 2/01 BAL > rare Serratia - enterococcus 2/01 aspergillus Ag > negative 2/02 resp culture > serratia  2/02 blood > negative 2/05 urine > negative 2/09 resp > serratia 2/15 BAL > serratia 3/05 blood cultures > negative  Antimicrobials:  Received remdesivir, steroids, actemra and convalescent plasma  See MAR for remainder  Interim history/subjective:  Afebrile  Glucose range 115-141  I/O 2.1L UOP, -17 ml in 24 hours Pt reports he feels well.  No acute complaints.  Lurline Idol has been capped for several days, tolerating well.   Objective   Blood pressure 134/79, pulse 91, temperature 97.8 F (36.6 C), resp. rate 16, height _0  (1.727 m), weight 118.2 kg, SpO2 99 %.        Intake/Output Summary (Last 24 hours) at 11/06/2019 1041 Last data filed at 11/06/2019 0900 Gross per 24 hour  Intake 1810.92 ml  Output 1551 ml  Net 259.92 ml   Filed Weights   11/04/19 0700 11/05/19 0600 11/06/19 0500  Weight: 119 kg 117.5 kg 118.2 kg    Examination: General: adult male sitting up in bed in NAD  HEENT: MM pink/moist, trach midline c/d/i, capped, speech strong / clear  Neuro: AAOx4, speech clear, able  to lift legs off the bed, has movement of shoulders but not able to lift arms against gravity / no fine motor  CV: s1s2 RRR, no m/r/g PULM:  Non-labored on [place] O2, lungs bilaterally clear  GI: soft, bsx4 active  Extremities: warm/dry, no edema  Skin: no rashes or lesions   Resolved Hospital Problem list   ARDS due to COVID 19  Decannulated from VV ECMO on 3/4 after 65 days  Pneumothoraces-resolved. Dislodged PEG tube Indurated area of the skin right lateral PEG tube. Serratia pneumonia> treated.  Hypernatremia  AKI with azotemia  Positive cumulative fluid balance Severe third spacing  Assessment & Plan:   Tracheostomy Dependent Respiratory Failure since 12/29 due to prolonged critical illness after COVID-19 infection Hypertension Neuromuscular weakness Bilateral lower feet deep tissue injury Anemia due to critical illness Insomnia TF Associated Diarrhea  Thrombocytopenia   Discussion Trach changed to #5 cuffless proximal trach on 3/24.  He has been capped for several days and is tolerating well.  His global weakness from critical illness myopathy post ICU/steroid/paralytics remains barrier to safe decannulation. He is tolerating oral diet without difficulty during daytime.   P: -continue trach capping  -wean O2 for saturations >94% -push pulmonary hygiene - IS, mobilize  -would like to see him more mobile before decannulation. Would not decannulate until he lift his arms to his mouth -focus on PT / OT efforts, nutrition  -OOB to chair  -to CIR once strong enough  -follow platelets closely, note new decrease over past several days   Noe Gens, MSN, NP-C [facility] 11/06/2019, 11:00 AM   Please see [email or web address] for pager details.

## 2019-11-07 LAB — BASIC METABOLIC PANEL
Anion gap: 13 (ref 5–15)
BUN: 42 mg/dL — ABNORMAL HIGH (ref 6–20)
CO2: 30 mmol/L (ref 22–32)
Calcium: 9.5 mg/dL (ref 8.9–10.3)
Chloride: 96 mmol/L — ABNORMAL LOW (ref 98–111)
Creatinine, Ser: 0.71 mg/dL (ref 0.61–1.24)
GFR calc Af Amer: >60 mL/min (ref >60)
GFR calc non Af Amer: >60 mL/min (ref >60)
Glucose, Bld: 105 mg/dL — ABNORMAL HIGH (ref 70–99)
Potassium: 4.3 mmol/L (ref 3.5–5.1)
Sodium: 139 mmol/L (ref 135–145)

## 2019-11-07 LAB — CBC
HCT: 38.6 % — ABNORMAL LOW (ref 39.0–52.0)
Hemoglobin: 11.7 g/dL — ABNORMAL LOW (ref 13.0–17.0)
MCH: 27.3 pg (ref 26.0–34.0)
MCHC: 30.3 g/dL (ref 30.0–36.0)
MCV: 90.2 fL (ref 80.0–100.0)
Platelets: 114 10*3/uL — ABNORMAL LOW (ref 150–400)
RBC: 4.28 MIL/uL (ref 4.22–5.81)
RDW: 15.7 % — ABNORMAL HIGH (ref 11.5–15.5)
WBC: 7 10*3/uL (ref 4.0–10.5)
nRBC: 0 % (ref 0.0–0.2)

## 2019-11-07 LAB — MAGNESIUM: Magnesium: 1.7 mg/dL (ref 1.7–2.4)

## 2019-11-07 LAB — GLUCOSE, CAPILLARY
Glucose-Capillary: 126 mg/dL — ABNORMAL HIGH (ref 70–99)
Glucose-Capillary: 141 mg/dL — ABNORMAL HIGH (ref 70–99)
Glucose-Capillary: 153 mg/dL — ABNORMAL HIGH (ref 70–99)
Glucose-Capillary: 89 mg/dL (ref 70–99)

## 2019-11-07 LAB — PHOSPHORUS: Phosphorus: 5.6 mg/dL — ABNORMAL HIGH (ref 2.5–4.6)

## 2019-11-07 MED ORDER — MAGNESIUM SULFATE 2 GM/50ML IV SOLN
2.0000 g | Freq: Once | INTRAVENOUS | Status: AC
  Administered 2019-11-07: 14:00:00 2 g via INTRAVENOUS
  Filled 2019-11-07: qty 50

## 2019-11-07 MED ORDER — TAMSULOSIN HCL 0.4 MG PO CAPS
0.4000 mg | ORAL_CAPSULE | Freq: Every day | ORAL | Status: DC
  Administered 2019-11-07 – 2019-11-13 (×6): 0.4 mg via ORAL
  Filled 2019-11-07 (×7): qty 1

## 2019-11-07 NOTE — Progress Notes (Addendum)
Patient ID: [name], male   DOB: Dec 06, 1975, 44 y.o.   MRN: [id number]    Advanced Heart Failure Rounding Note   Subjective:   Remains incontinent. Requires assistance for repositioning and meals.   Denies SOB. Wants to work with therapy.  Objective:   Weight Range:  Vital Signs:   Temp:  [97.9 F (36.6 C)-98.8 F (37.1 C)] 98.2 F (36.8 C) (04/01 0758) Pulse Rate:  [88-98] 91 (04/01 0758) Resp:  [14-22] 21 (04/01 0758) BP: (113-150)/(45-99) 134/73 (04/01 0758) SpO2:  [94 %-100 %] 99 % (04/01 0758) Weight:  [117.5 kg] 117.5 kg (04/01 0437) Last BM Date: 11/06/19  Weight change: Filed Weights   11/05/19 0600 11/06/19 0500 11/07/19 0437  Weight: 117.5 kg 118.2 kg 117.5 kg    Intake/Output:   Intake/Output Summary (Last 24 hours) at 11/07/2019 1025 Last data filed at 11/07/2019 0100 Gross per 24 hour  Intake 825 ml  Output 1125 ml  Net -300 ml     PHYSICAL EXAM: General:  In bed  No resp difficulty HEENT: normal Neck: Trach/plugged. Supple. no JVD. Carotids 2+ bilat; no bruits. No lymphadenopathy or thryomegaly appreciated. Cor: PMI nondisplaced. Regular rate & rhythm. No rubs, gallops or murmurs. Lungs: clear Abdomen: soft, nontender, nondistended. No hepatosplenomegaly. No bruits or masses. Good bowel sounds. Extremities: no cyanosis, clubbing, rash, edema Neuro: alert & orientedx3, cranial nerves grossly intact. moves all 4 extremities but extremely weak. Affect pleasant   Telemetry: SR 90-100s   Labs: Basic Metabolic Panel: Recent Labs  Lab 11/01/19 0225 11/01/19 0225 11/03/19 0509 11/03/19 0509 11/05/19 0302 11/06/19 0355 11/07/19 0430  NA 143  --  140  --  138 136 139  K 3.9  --  4.2  --  4.2 3.8 4.3  CL 101  --  100  --  98 95* 96*  CO2 33*  --  30  --  31 30 30   GLUCOSE 115*  --  99  --  109* 141* 105*  BUN 43*  --  41*  --  46* 50* 42*  CREATININE 0.53*  --  0.54*  --  0.60* 0.74 0.71  CALCIUM 9.3   < > 9.6   < > 9.6 9.3 9.5  MG 1.8   --  1.7  --  1.7  --  1.7  PHOS 4.7*  --  4.8*  --  4.7*  --  5.6*   < > = values in this interval not displayed.    Liver Function Tests: No results for input(s): AST, ALT, ALKPHOS, BILITOT, PROT, ALBUMIN in the last 168 hours. No results for input(s): LIPASE, AMYLASE in the last 168 hours. No results for input(s): AMMONIA in the last 168 hours.  CBC: Recent Labs  Lab 11/01/19 0225 11/03/19 0509 11/05/19 0302 11/06/19 0355 11/07/19 0430  WBC 6.7 6.1 6.7 6.8 7.0  HGB 11.2* 11.5* 11.7* 11.3* 11.7*  HCT 37.5* 38.1* 38.3* 37.1* 38.6*  MCV 94.2 92.7 91.0 91.4 90.2  PLT 125* 113* 109* 102* 114*    Cardiac Enzymes: No results for input(s): CKTOTAL, CKMB, CKMBINDEX, TROPONINI in the last 168 hours.  BNP: BNP (last 3 results) Recent Labs    07/22/19 1039  BNP 15.3    ProBNP (last 3 results) No results for input(s): PROBNP in the last 8760 hours.    Other results:  Imaging: No results found.   Medications:     Scheduled Medications: . amLODipine  10 mg Per Tube Daily  .  vitamin C  500 mg Per Tube Daily  . carvedilol  25 mg Per Tube BID WC  . chlorhexidine  15 mL Mouth Rinse BID  . Chlorhexidine Gluconate Cloth  6 each Topical Daily  . clonazepam  1 mg Per Tube QHS  . doxazosin  1 mg Per Tube QHS  . enoxaparin (LOVENOX) injection  60 mg Subcutaneous Q24H  . escitalopram  20 mg Per Tube Daily  . feeding supplement (ENSURE ENLIVE)  237 mL Oral BID BM  . feeding supplement (OSMOLITE 1.5 CAL)  1,000 mL Per Tube Q24H  . feeding supplement (PRO-STAT SUGAR FREE 64)  60 mL Per Tube QID  . Gerhardt's butt cream   Topical Daily  . insulin aspart  0-15 Units Subcutaneous Q6H  . mouth rinse  15 mL Mouth Rinse q12n4p  . melatonin  6 mg Per Tube QHS  . montelukast  10 mg Per Tube QHS  . nutrition supplement (JUVEN)  1 packet Per Tube BID BM  . pantoprazole sodium  40 mg Per Tube BID  . polycarbophil  625 mg Oral Daily  . psyllium  1 packet Oral TID  . sodium chloride  flush  10-40 mL Intracatheter Q12H  . spironolactone  25 mg Oral Daily  . traZODone  100 mg Per Tube QHS    Infusions: . sodium chloride Stopped (11/06/19 0214)    PRN Medications: sodium chloride, acetaminophen, albuterol, calcium carbonate, [COMPLETED] diazepam **FOLLOWED BY** diazepam, hydrALAZINE, HYDROmorphone, ipratropium-albuterol, loperamide HCl, ondansetron (ZOFRAN) IV, sodium chloride, sodium chloride flush   Assessment/Plan:    1. Acute hypoxic/hypercarbic respiratory failure due to COVID PNA: Remained markedly hypoxic despite full support. Intubated 12/26. S/p bilateral CTs 12/26 for pneumothoraces.  TEE on 12/26 LVEF 70% RV ok. VV ECMO begun 12/26. Tracheostomy 12/29.  COVID ARDS at this point. ECMO circuit changed 1/8 & 1/23. Crescent cannula placed 2/2 (femoral cannuals removed) - Decannulated 3/4 - Still w/ trach collar but currently on [place]. O2 sats stable. e.  - PCCM following and have downsized trach. Feel that ongoing weakness is biggest barrier to decannulation. Continue trach trials No change today - Continue exercises with PT/OT.  - Remains very weak PT/OT working with patient every other day. ICU nurses also doing exercises with him 2x/day - Requires nurses to feed him every meal and frequent repositioning  2. COVID PNA: management of COVID infection per CCM. CXR with bilateral multifocal PNA progressing to likely ARDS.  - s/p 10 days of remdesivir - 12/16, 12/2 s/p tociluzimab - 12/17 s/p convalescent plasma - Decadron completed 1/11 - Resolved  3. FEN - Remains on TFs. Loose stools improved with change in regimen - Now on D3 diet. stable - Continue PPI.   4. Hypernatremia -Resolved.   5. DTIs - On both feet.  Improving  6.  Hypokalemia. - Stable today.   7.  Diffuse muscle weakness. -This is consistent with ICU/steroid/paralytics myopathy.  Continue passive range of motion exercises with PT.   -PT and OT are following.  Both have recommended  CIR.  CIR following closley and feels he is not ready yet. Continue to push w/ acute-side PT/OT to maximize strength, bed mobility.  - continue OT/PT. Appreciate all the assistance - Remains very weak PT/OT working with patient every other day. ICU nurses also doing exercises with him 2x/day - Requires nurses to feed him every meal and frequent repositioning - CIR following.   8. HTN Stable.   10. Hypomag - Mag 1.7.  Give  2 grams IV mag.   11. Urinary retention - Improved. Continue Flomax   Length of Stay: [place] NP_C  11/07/2019, 10:25 AM  Advanced Heart Failure Team Pager [phone] (M-F; 7a - 4p)  Please contact [facility] for night-coverage after hours (4p -7a ) and weekends on [email or web address]   Patient seen and examined with the above-signed Advanced Practice Provider and/or Housestaff. I personally reviewed laboratory data, imaging studies and relevant notes. I independently examined the patient and formulated the important aspects of the plan. I have edited the note to reflect any of my changes or salient points. I have personally discussed the plan with the patient and/or family.  Remains on trach collar. Getting stronger but continues to need full assist for meals and frequent repositioning.   General:  Lying in bed No resp difficulty HEENT: normal Neck: supple. no JVD. Trach collar Carotids 2+ bilat; no bruits. No lymphadenopathy or thryomegaly appreciated. Cor: PMI nondisplaced. Regular rate & rhythm. No rubs, gallops or murmurs. Lungs: clear Abdomen: Obese soft, nontender, nondistended. No hepatosplenomegaly. No bruits or masses. Good bowel sounds. + PEG Extremities: no cyanosis, clubbing, rash, edema Neuro: alert & orientedx3, cranial nerves grossly intact. Extremities remain weak. Affect pleasant  Continue trach collar until respiratory muscle strength improves. Continue aggressive PT/OT.   Glori Bickers, MD  3:39 PM

## 2019-11-07 NOTE — Progress Notes (Signed)
Physical Therapy Treatment Patient Details Name: [name] MRN: [id number] DOB: [birth year] Today's Date: 11/07/2019    History of Present Illness 44 y/o M admitted 12/14 with COVID pneumonia causing acute hypoxemic respiratory failure.   Developed pneumomediastinum 12/23 with concern for possible bilateral small pneumothoraces on 12/24. Pt intubated on 12/26 and started on ECMO on 12/27. Tracheostomy on 12/29 with bleeding from trach an dother sites on 12/31. Bronch on 1/11 and 1/15. Slowly weaning sedation 2/5. Continuing to wean sedation and starting sweep trials to wean ECMO - Decannulated 3/4    PT Comments    Used Kreg Tilt bed to work on strengthening and standing. Total time tilted 20 minutes. Worked on quad sets/knee extension x10 at 40 degrees of tilt. Also worked on Chief Financial Officer and isometric trunk extension x 10 in this position. Intermittently incr tilt as follows: 1) 49 degrees x 45 sec - rest x 2 minutes at 15 degrees 2) 51 degrees x 45 sec - rest 2 minutes at 15 degrees 3)52 degrees x 45 sec -rest 2 minutes. Pt able to maintain knee extension in these positions. Tolerated treatment well.     Follow Up Recommendations  CIR;Supervision/Assistance - 24 hour     Equipment Recommendations  Other (comment)(To be determined at next venue)    Recommendations for Other Services       Precautions / Restrictions Precautions Precautions: Fall;Other (comment) Precaution Comments: PEG, trach; bilat foot wounds Required Braces or Orthoses: Other Brace Other Brace: B prevalon boots; R wrist cock-up splint Restrictions Weight Bearing Restrictions: No    Mobility  Bed Mobility Overal bed mobility: Needs Assistance                Transfers                    Ambulation/Gait                 Stairs             Wheelchair Mobility    Modified Rankin (Stroke Patients Only)       Balance                                             Cognition Arousal/Alertness: Awake/alert Behavior During Therapy: Anxious;WFL for tasks assessed/performed Overall Cognitive Status: Impaired/Different from baseline Area of Impairment: Memory;Awareness;Problem solving                   Current Attention Level: Selective Memory: Decreased short-term memory Following Commands: Follows one step commands consistently Safety/Judgement: Decreased awareness of deficits Awareness: Emergent Problem Solving: Slow processing General Comments: Improving      Exercises      General Comments        Pertinent Vitals/Pain Pain Assessment: Faces Faces Pain Scale: Hurts little more Pain Location: Rt knee Pain Descriptors / Indicators: Grimacing;Guarding Pain Intervention(s): Monitored during session;Limited activity within patient's tolerance;Repositioned    Home Living                      Prior Function            PT Goals (current goals can now be found in the care plan section) Acute Rehab PT Goals Patient Stated Goal: regain strength and independence Progress towards PT goals: Progressing toward goals    Frequency  Min 3X/week      PT Plan Current plan remains appropriate    Co-evaluation              AM-PAC PT "6 Clicks" Mobility   Outcome Measure  Help needed turning from your back to your side while in a flat bed without using bedrails?: Total Help needed moving from lying on your back to sitting on the side of a flat bed without using bedrails?: Total Help needed moving to and from a bed to a chair (including a wheelchair)?: Total Help needed standing up from a chair using your arms (e.g., wheelchair or bedside chair)?: Total Help needed to walk in hospital room?: Total Help needed climbing 3-5 steps with a railing? : Total 6 Click Score: 6    End of Session   Activity Tolerance: Patient tolerated treatment well Patient left: in bed;with call bell/phone within reach Nurse  Communication: Mobility status;Need for lift equipment PT Visit Diagnosis: Muscle weakness (generalized) (M62.81);Other symptoms and signs involving the nervous system (R29.898);Other abnormalities of gait and mobility (R26.89)     Time: 2446-9507 PT Time Calculation (min) (ACUTE ONLY): 52 min  Charges:  $Therapeutic Activity: 38-52 mins                     [place] Pager [phone] Office [place] 11/07/2019, 1:31 PM

## 2019-11-08 LAB — PHOSPHORUS: Phosphorus: 4.6 mg/dL (ref 2.5–4.6)

## 2019-11-08 LAB — BASIC METABOLIC PANEL
Anion gap: 9 (ref 5–15)
BUN: 53 mg/dL — ABNORMAL HIGH (ref 6–20)
CO2: 32 mmol/L (ref 22–32)
Calcium: 9.6 mg/dL (ref 8.9–10.3)
Chloride: 96 mmol/L — ABNORMAL LOW (ref 98–111)
Creatinine, Ser: 0.78 mg/dL (ref 0.61–1.24)
GFR calc Af Amer: >60 mL/min (ref >60)
GFR calc non Af Amer: >60 mL/min (ref >60)
Glucose, Bld: 112 mg/dL — ABNORMAL HIGH (ref 70–99)
Potassium: 4.4 mmol/L (ref 3.5–5.1)
Sodium: 137 mmol/L (ref 135–145)

## 2019-11-08 LAB — GLUCOSE, CAPILLARY
Glucose-Capillary: 101 mg/dL — ABNORMAL HIGH (ref 70–99)
Glucose-Capillary: 110 mg/dL — ABNORMAL HIGH (ref 70–99)
Glucose-Capillary: 130 mg/dL — ABNORMAL HIGH (ref 70–99)
Glucose-Capillary: 81 mg/dL (ref 70–99)
Glucose-Capillary: 96 mg/dL (ref 70–99)

## 2019-11-08 LAB — CBC
HCT: 38.6 % — ABNORMAL LOW (ref 39.0–52.0)
Hemoglobin: 11.9 g/dL — ABNORMAL LOW (ref 13.0–17.0)
MCH: 27.9 pg (ref 26.0–34.0)
MCHC: 30.8 g/dL (ref 30.0–36.0)
MCV: 90.6 fL (ref 80.0–100.0)
Platelets: 120 10*3/uL — ABNORMAL LOW (ref 150–400)
RBC: 4.26 MIL/uL (ref 4.22–5.81)
RDW: 15.5 % (ref 11.5–15.5)
WBC: 6.1 10*3/uL (ref 4.0–10.5)
nRBC: 0 % (ref 0.0–0.2)

## 2019-11-08 LAB — MAGNESIUM: Magnesium: 1.9 mg/dL (ref 1.7–2.4)

## 2019-11-08 NOTE — Progress Notes (Signed)
SLP Cancellation Note  Patient Details Name: [name] MRN: [id number] DOB: 06-26-1976   Cancelled treatment:       Reason Eval/Treat Not Completed: Patient declined, no reason specified(Pt was approached for swallow treatment and lunch tray was at bedside but pt reported that he would like to defer eating at this time. SLP will follow up on subsequent date.)  [name] I. Hardin Negus, [place], [place] Office number [phone] Pager [place] 11/08/2019, 11:32 AM

## 2019-11-08 NOTE — Progress Notes (Addendum)
NAME:  IHSAN NOMURA, MRN:  [id number], DOB:  [birth year], LOS: 108 ADMISSION DATE:  07/22/2019, CONSULTATION DATE:  12/24 REFERRING MD:  Sloan Leiter, CHIEF COMPLAINT:  Dyspnea   Brief History   44 y/o male admitted 12/14 with COVID pneumonia leading to ARDS, pneumomediastinum.  Intubated 12/26, bilateral chest tubes placed.  Prolonged ICU course requiring ventilation support, ECMO.    Past Medical History  GERD HTN Asthma  Significant Hospital Events   12/14 admit with hypoxemic respiratory failure in setting of COVID-19 pneumonia 12/23 noted to have pneumomediastinum on chest x-ray 12/24PCCM consulted for evaluation 12/26 worsening hypoxemia, intubated 12/27 VV fem/fem ECMO cannulation. See nursing event notes for rundown of procedures after 12/27 12/29 Tracheostomy placed  1/05 epistaxis, nasal packing 1/30 improved cxr, improved lung compliance, TV 350s on 15PC  1/31 CXR increased infiltrates, +CFB     2/02 Converted to right IJ Crescent cannula. 2/03 PEG tube inserted at bedside 2/14 30 min sweep trial > paO2 105 (18 PEEP, 60% FiO2) pH 7.22, pCO2 87 2/20 2 hours sweep trial 2/21 1.5 hour sweep trial 2/24 7-hour sweep trial.  Increasingly awake. 3/02 Decannulation attempt aborted due to increased hypercapnia when sedated for procedure 3/04 Decannulated 3/8 PEG tube repositioned by IR 3/9 Trach wean 3/14 started 24/7 TC 3/23 starting capping trials 3/24 changed to 5 prox xlt cuffless 3/30 TF at night with daytime meals, tolerating PMV   Consults:  PCCM, CTS, CHF, Palliative  Procedures:  PICC 1/3 >> R radial a-line 1/20 > 3/5 R IJ ECMO cath 2/3 > 3/4  Tracheostomy 12/29 >> PEG tube replacement 3/28 >>  Significant Diagnostic Tests:  CT chest 12/22>extensive pneumomediastinum, multifocal patchy bilateral groundglass opacities CXR 1/25 > improving bilateral infiltrates. ECMO cannulae in place. CXR 2/5 > clearing bilateral infiltrates  Micro Data:  BCx2 12/14  >negative  Sputum 12/15 > OPF 12/28 blood > negative 12/29 > staph aureus 1/04 bronch 1/02 acid fast smear bronch: negative 1/02 fungal cx bronch: negative1/12 BAL: serratia, staph epidermidis 1/04 BAL > MSSA, Serratia 1/09 blood > negative 1/09 Resp culture > MSSA, serratia 1/11 serratia > negative 1/12 fungus culture > negative 1/12 BAL > serratia 1/18 BAL >> rare Serratia. 1/24 blood > negative 1/30 blood > negative 1/30 resp culture > serratia 2/01 BAL > rare Serratia - enterococcus 2/01 aspergillus Ag > negative 2/02 resp culture > serratia  2/02 blood > negative 2/05 urine > negative 2/09 resp > serratia 2/15 BAL > serratia 3/05 blood cultures > negative  Antimicrobials:  Received remdesivir, steroids, actemra and convalescent plasma  See MAR for remainder  Interim history/subjective:  Tolerating capping trials. No swallow issues per Speech. Tolerating diet  Objective   Blood pressure 114/69, pulse 95, temperature 98.7 F (37.1 C), temperature source Oral, resp. rate (!) 25, height _0  (1.727 m), weight 117.5 kg, SpO2 97 %.    FiO2 (%):  [99 %] 99 %   Intake/Output Summary (Last 24 hours) at 11/08/2019 1204 Last data filed at 11/08/2019 0900 Gross per 24 hour  Intake 1713.7 ml  Output 2375 ml  Net -661.3 ml   Filed Weights   11/05/19 0600 11/06/19 0500 11/07/19 0437  Weight: 117.5 kg 118.2 kg 117.5 kg   Physical Exam: General: Obese, laying in bed, no acute distress HENT: [place], AT, OP clear, MMM Neck: Cuffless #5 proximal XLT capped, c/d/i Eyes: EOMI, no scleral icterus Respiratory: Clear to auscultation bilaterally.  No crackles, wheezing or rales Cardiovascular: RRR, -M/R/G, no JVD  GI: BS+, soft, nontender Extremities:-Edema,-tenderness Neuro: AAO x4, CNII-XII grossly intact  Resolved Hospital Problem list   ARDS due to COVID 19 Decannulated from VV ECMO on 3/4 after 65 days  Pneumothoraces-resolved. Dislodged PEG tube Indurated area of the skin  right lateral PEG tube. Serratia pneumonia> treated.  Hypernatremia  AKI with azotemia  Positive cumulative fluid balance Severe third spacing  Assessment & Plan:   Tracheostomy Dependent Respiratory Failure since 12/29 due to prolonged critical illness after COVID-19 infection Hypertension Neuromuscular weakness Bilateral lower feet deep tissue injury Anemia due to critical illness Insomnia TF Associated Diarrhea  Thrombocytopenia   Discussion Tolerating capped trach for >1 week. Tolerating diet  P: -Plan to decannulate. If stoma does not close in two weeks, then will need surgical closure -wean O2 for saturations >94% -push pulmonary hygiene - IS, mobilize  -focus on PT / OT efforts, nutrition  -OOB to chair  -to CIR once strong enough   Rodman Pickle, M.D. [facility] Pulmonary/Critical Care Medicine 11/08/2019 12:04 PM

## 2019-11-08 NOTE — Progress Notes (Signed)
Order received for decannulation.  A 5.0 XLT uncuffed trach was removed and site was covered with occlusive dressing.  Patient able to vocalize post removal.  Patient tolerated well with no complications.

## 2019-11-08 NOTE — Progress Notes (Addendum)
Patient ID: [name], male   DOB: 12-22-1975, 44 y.o.   MRN: [id number]    Advanced Heart Failure Rounding Note   Subjective:    Watching TV. Doing ok. No cardiac complaints.   Continues to work w/ PT. Feels that he is making progress.   Objective:   Weight Range:  Vital Signs:   Temp:  [98.1 F (36.7 C)-99.4 F (37.4 C)] 98.7 F (37.1 C) (04/02 0900) Pulse Rate:  [93-105] 95 (04/02 1104) Resp:  [17-25] 25 (04/02 1104) BP: (114-142)/(66-90) 114/69 (04/02 1104) SpO2:  [93 %-100 %] 97 % (04/02 1104) FiO2 (%):  [99 %] 99 % (04/01 1943) Last BM Date: 11/07/19  Weight change: Filed Weights   11/05/19 0600 11/06/19 0500 11/07/19 0437  Weight: 117.5 kg 118.2 kg 117.5 kg    Intake/Output:   Intake/Output Summary (Last 24 hours) at 11/08/2019 1125 Last data filed at 11/08/2019 0900 Gross per 24 hour  Intake 1713.7 ml  Output 2375 ml  Net -661.3 ml     PHYSICAL EXAM: General:  Obese WM w/ TC  No resp difficulty HEENT: normal anicteric  Neck: + Trach/capped. Supple. no JVD. Carotids 2+ bilat; no bruits. No lymphadenopathy or thryomegaly appreciated. Cor: PMI nondisplaced. Regular rate & rhythm. No rubs, gallops or murmurs. Lungs: clear no wheeze Abdomen: obese but soft, nontender, nondistended. No hepatosplenomegaly. No bruits or masses. Good bowel sounds. + PEG tube Extremities: no cyanosis, clubbing, rash, edema Neuro: alert & orientedx3, cranial nerves grossly intact. Extremities remain weak R>L Affect pleasant  Telemetry: SR 90s Personally reviewed   Labs: Basic Metabolic Panel: Recent Labs  Lab 11/03/19 0509 11/03/19 0509 11/05/19 0302 11/05/19 0302 11/06/19 0355 11/07/19 0430 11/08/19 0311  NA 140  --  138  --  136 139 137  K 4.2  --  4.2  --  3.8 4.3 4.4  CL 100  --  98  --  95* 96* 96*  CO2 30  --  31  --  30 30 32  GLUCOSE 99  --  109*  --  141* 105* 112*  BUN 41*  --  46*  --  50* 42* 53*  CREATININE 0.54*  --  0.60*  --  0.74 0.71 0.78   CALCIUM 9.6   < > 9.6   < > 9.3 9.5 9.6  MG 1.7  --  1.7  --   --  1.7 1.9  PHOS 4.8*  --  4.7*  --   --  5.6* 4.6   < > = values in this interval not displayed.    Liver Function Tests: No results for input(s): AST, ALT, ALKPHOS, BILITOT, PROT, ALBUMIN in the last 168 hours. No results for input(s): LIPASE, AMYLASE in the last 168 hours. No results for input(s): AMMONIA in the last 168 hours.  CBC: Recent Labs  Lab 11/03/19 0509 11/05/19 0302 11/06/19 0355 11/07/19 0430 11/08/19 0311  WBC 6.1 6.7 6.8 7.0 6.1  HGB 11.5* 11.7* 11.3* 11.7* 11.9*  HCT 38.1* 38.3* 37.1* 38.6* 38.6*  MCV 92.7 91.0 91.4 90.2 90.6  PLT 113* 109* 102* 114* 120*    Cardiac Enzymes: No results for input(s): CKTOTAL, CKMB, CKMBINDEX, TROPONINI in the last 168 hours.  BNP: BNP (last 3 results) Recent Labs    07/22/19 1039  BNP 15.3    ProBNP (last 3 results) No results for input(s): PROBNP in the last 8760 hours.    Other results:  Imaging: No results found.   Medications:  Scheduled Medications: . amLODipine  10 mg Per Tube Daily  . vitamin C  500 mg Per Tube Daily  . carvedilol  25 mg Per Tube BID WC  . chlorhexidine  15 mL Mouth Rinse BID  . Chlorhexidine Gluconate Cloth  6 each Topical Daily  . clonazepam  1 mg Per Tube QHS  . enoxaparin (LOVENOX) injection  60 mg Subcutaneous Q24H  . escitalopram  20 mg Per Tube Daily  . feeding supplement (ENSURE ENLIVE)  237 mL Oral BID BM  . feeding supplement (OSMOLITE 1.5 CAL)  1,000 mL Per Tube Q24H  . feeding supplement (PRO-STAT SUGAR FREE 64)  60 mL Per Tube QID  . Gerhardt's butt cream   Topical Daily  . insulin aspart  0-15 Units Subcutaneous Q6H  . mouth rinse  15 mL Mouth Rinse q12n4p  . melatonin  6 mg Per Tube QHS  . montelukast  10 mg Per Tube QHS  . nutrition supplement (JUVEN)  1 packet Per Tube BID BM  . pantoprazole sodium  40 mg Per Tube BID  . polycarbophil  625 mg Oral Daily  . psyllium  1 packet Oral TID  .  sodium chloride flush  10-40 mL Intracatheter Q12H  . spironolactone  25 mg Oral Daily  . tamsulosin  0.4 mg Oral QPC supper  . traZODone  100 mg Per Tube QHS    Infusions: . sodium chloride Stopped (11/06/19 0214)    PRN Medications: sodium chloride, acetaminophen, albuterol, calcium carbonate, [COMPLETED] diazepam **FOLLOWED BY** diazepam, hydrALAZINE, HYDROmorphone, ipratropium-albuterol, loperamide HCl, ondansetron (ZOFRAN) IV, sodium chloride, sodium chloride flush   Assessment/Plan:    1. Acute hypoxic/hypercarbic respiratory failure due to COVID PNA: Remained markedly hypoxic despite full support. Intubated 12/26. S/p bilateral CTs 12/26 for pneumothoraces.  TEE on 12/26 LVEF 70% RV ok. VV ECMO begun 12/26. Tracheostomy 12/29.  COVID ARDS at this point. ECMO circuit changed 1/8 & 1/23. Crescent cannula placed 2/2 (femoral cannuals removed) - Decannulated 3/4 - Still w/ trach collar but currently on [place]. O2 sats stable.  - PCCM following and have downsized trach. Feel that ongoing weakness is biggest barrier to decannulation. Continue trach trials. No change today - Continue exercises with PT/OT.  - Remains very weak PT/OT working with patient every other day. ICU nurses also doing exercises with him 2x/day - Requires nurses to feed him every meal and frequent repositioning  2. COVID PNA: management of COVID infection per CCM. CXR with bilateral multifocal PNA progressing to likely ARDS.  - s/p 10 days of remdesivir - 12/16, 12/2 s/p tociluzimab - 12/17 s/p convalescent plasma - Decadron completed 1/11 - Resolved  3. FEN - Remains on TFs. Loose stools improved with change in regimen - Now on D3 diet. stable - Continue PPI.   4. Hypernatremia -Resolved.   5. DTIs - On both feet.  Improving  6.  Hypokalemia. - Stable today. K 4.4  7.  Diffuse muscle weakness. -This is consistent with ICU/steroid/paralytics myopathy.  Continue passive range of motion exercises with  PT.   -PT and OT are following.  Both have recommended CIR.  CIR following closley and feels he is not ready yet. Continue to push w/ acute-side PT/OT to maximize strength, bed mobility.  - continue OT/PT. Appreciate all the assistance - Remains very weak PT/OT working with patient every other day. ICU nurses also doing exercises with him 2x/day - Requires nurses to feed him every meal and frequent repositioning - CIR following.  8. HTN - Stable and controlled   10. Hypomag - Mag 1.9 - repeat level in AM. supp as needed  11. Urinary retention - Improved. Continue Flomax   Length of Stay: [place] PA-C  11/08/2019, 11:25 AM  Advanced Heart Failure Team Pager [phone] (M-F; 7a - 4p)  Please contact [facility] for night-coverage after hours (4p -7a ) and weekends on [email or web address]   Patient seen and examined with the above-signed Advanced Practice Provider and/or Housestaff. I personally reviewed laboratory data, imaging studies and relevant notes. I independently examined the patient and formulated the important aspects of the plan. I have edited the note to reflect any of my changes or salient points. I have personally discussed the plan with the patient and/or family.  Still weak. Working with PT/OT and nurses frequently to improved. Still requires nearly full care for meals and frequent repositioning.   Lurline Idol has been capped for 1 week. CCM plans to decannulate.  CIR to reassess Monday.   Exam as above (edited)  Glori Bickers, MD  3:28 PM

## 2019-11-08 NOTE — Progress Notes (Signed)
Inpatient Rehab Admissions Coordinator:   Following for my colleague, Danne [name].  Met with pt at bedside.  Note decannulated after my visit.  Will f/u Monday.  Shann Medal, PT, DPT Admissions Coordinator [phone] 11/08/19  12:34 PM

## 2019-11-08 NOTE — Progress Notes (Addendum)
Physical Therapy Treatment Patient Details Name: [name] MRN: [id number] DOB: 1976-08-07 Today's Date: 11/08/2019    History of Present Illness 44 y/o M admitted 12/14 with COVID pneumonia causing acute hypoxemic respiratory failure.   Developed pneumomediastinum 12/23 with concern for possible bilateral small pneumothoraces on 12/24. Pt intubated on 12/26 and started on ECMO on 12/27. Tracheostomy on 12/29. Pt on ECMO until 3/4. Pt on vent until 3/14 and trach removed 4/2.  PMH - asthma, htn, obesity, rt shoulder I&D.     PT Comments    Pt continues to make slow, steady progress. Continue to recommend CIR. Attempted to allow pt to use [place] himself with universal cuff on RUE. In supine pt able to bring [place] to mouth but with [facility] raised pt able to raise it consistently to his mouth. Removed cuff for now and will notify OT of attempt.   Follow Up Recommendations  CIR;Supervision/Assistance - 24 hour     Equipment Recommendations  Other (comment)(To be determined)    Recommendations for Other Services       Precautions / Restrictions Precautions Precautions: Fall;Other (comment) Precaution Comments: PEG, trach; bilat foot wounds Required Braces or Orthoses: Other Brace Other Brace: B prevalon boots; R wrist cock-up splint Restrictions Weight Bearing Restrictions: No    Mobility  Bed Mobility Overal bed mobility: Needs Assistance Bed Mobility: Rolling;Supine to Sit;Sit to Supine Rolling: Mod assist   Supine to sit: Max assist;+2 for physical assistance;HOB elevated Sit to supine: Max assist;+2 for physical assistance   General bed mobility comments: Pt assisting to bring legs off of bed. Assist to elevate trunk into sitting and bring hips to EOB.   Transfers Overall transfer level: Needs assistance Equipment used: Ambulation equipment used Transfers: Sit to/from Stand Sit to Stand: +2 physical assistance;Max assist(with 3rd person operating Clarise Cruz Plus)          General transfer comment: Used sara plus with 3 person assist to clear pt's hips from the bed 3x. Cleared hips 10-12'. Used bed pad under hips with 2 people lifting hips as Clarise Cruz plus raised by the 3rd person.   Ambulation/Gait                 Stairs             Wheelchair Mobility    Modified Rankin (Stroke Patients Only)       Balance Overall balance assessment: Needs assistance Sitting-balance support: Feet supported;Bilateral upper extremity supported Sitting balance-Leahy Scale: Poor Sitting balance - Comments: Sat EOB ~20 minutes with Clarise Cruz Plus with min guard   Standing balance support: Bilateral upper extremity supported;During functional activity Standing balance-Leahy Scale: Zero Standing balance comment: Clarise Cruz plus in addition to +2 max assist to partially stand.                             Cognition Arousal/Alertness: Awake/alert Behavior During Therapy: Anxious;WFL for tasks assessed/performed Overall Cognitive Status: Impaired/Different from baseline Area of Impairment: Memory;Awareness;Problem solving                   Current Attention Level: Selective Memory: Decreased short-term memory     Awareness: Emergent Problem Solving: Slow processing        Exercises      General Comments General comments (skin integrity, edema, etc.): VSS on 2L       Pertinent Vitals/Pain Pain Assessment: Faces Faces Pain Scale: Hurts little more Pain Location: rt knee  Pain Descriptors / Indicators: Grimacing;Guarding Pain Intervention(s): Monitored during session    Home Living                      Prior Function            PT Goals (current goals can now be found in the care plan section) Acute Rehab PT Goals Patient Stated Goal: regain strength and independence Progress towards PT goals: Progressing toward goals    Frequency    Min 3X/week      PT Plan Current plan remains appropriate    Co-evaluation               AM-PAC PT "6 Clicks" Mobility   Outcome Measure  Help needed turning from your back to your side while in a flat bed without using bedrails?: A Lot Help needed moving from lying on your back to sitting on the side of a flat bed without using bedrails?: Total Help needed moving to and from a bed to a chair (including a wheelchair)?: Total Help needed standing up from a chair using your arms (e.g., wheelchair or bedside chair)?: Total Help needed to walk in hospital room?: Total Help needed climbing 3-5 steps with a railing? : Total 6 Click Score: 7    End of Session Equipment Utilized During Treatment: Oxygen Activity Tolerance: Patient tolerated treatment well Patient left: in bed;with call bell/phone within reach Nurse Communication: Mobility status;Need for lift equipment PT Visit Diagnosis: Muscle weakness (generalized) (M62.81);Other symptoms and signs involving the nervous system (R29.898);Other abnormalities of gait and mobility (R26.89)     Time: 1410-1505 PT Time Calculation (min) (ACUTE ONLY): 55 min  Charges:  $Therapeutic Activity: 53-67 mins                     [place] Pager [phone] [place] 11/08/2019, 4:59 PM

## 2019-11-09 LAB — GLUCOSE, CAPILLARY
Glucose-Capillary: 121 mg/dL — ABNORMAL HIGH (ref 70–99)
Glucose-Capillary: 100 mg/dL — ABNORMAL HIGH (ref 70–99)
Glucose-Capillary: 143 mg/dL — ABNORMAL HIGH (ref 70–99)
Glucose-Capillary: 120 mg/dL — ABNORMAL HIGH (ref 70–99)

## 2019-11-09 LAB — BASIC METABOLIC PANEL
Anion gap: 12 (ref 5–15)
BUN: 50 mg/dL — ABNORMAL HIGH (ref 6–20)
CO2: 31 mmol/L (ref 22–32)
Calcium: 9.5 mg/dL (ref 8.9–10.3)
Chloride: 96 mmol/L — ABNORMAL LOW (ref 98–111)
Creatinine, Ser: 0.75 mg/dL (ref 0.61–1.24)
GFR calc Af Amer: >60 mL/min (ref >60)
GFR calc non Af Amer: >60 mL/min (ref >60)
Glucose, Bld: 148 mg/dL — ABNORMAL HIGH (ref 70–99)
Potassium: 4.2 mmol/L (ref 3.5–5.1)
Sodium: 139 mmol/L (ref 135–145)

## 2019-11-09 LAB — MAGNESIUM: Magnesium: 1.8 mg/dL (ref 1.7–2.4)

## 2019-11-09 LAB — CBC
HCT: 36.8 % — ABNORMAL LOW (ref 39.0–52.0)
Hemoglobin: 11.2 g/dL — ABNORMAL LOW (ref 13.0–17.0)
MCH: 27.4 pg (ref 26.0–34.0)
MCHC: 30.4 g/dL (ref 30.0–36.0)
MCV: 90 fL (ref 80.0–100.0)
Platelets: 118 10*3/uL — ABNORMAL LOW (ref 150–400)
RBC: 4.09 MIL/uL — ABNORMAL LOW (ref 4.22–5.81)
RDW: 15.3 % (ref 11.5–15.5)
WBC: 5.6 10*3/uL (ref 4.0–10.5)
nRBC: 0 % (ref 0.0–0.2)

## 2019-11-09 LAB — PHOSPHORUS: Phosphorus: 4.7 mg/dL — ABNORMAL HIGH (ref 2.5–4.6)

## 2019-11-09 MED ORDER — LORATADINE 10 MG PO TABS
10.0000 mg | ORAL_TABLET | Freq: Every day | ORAL | Status: DC
  Administered 2019-11-09 – 2019-11-14 (×6): 10 mg via ORAL
  Filled 2019-11-09 (×6): qty 1

## 2019-11-09 MED ORDER — MAGNESIUM SULFATE 2 GM/50ML IV SOLN
2.0000 g | Freq: Once | INTRAVENOUS | Status: AC
  Administered 2019-11-09: 2 g via INTRAVENOUS
  Filled 2019-11-09: qty 50

## 2019-11-09 NOTE — Progress Notes (Signed)
Patient ID: [name], male   DOB: 08/14/1975, 44 y.o.   MRN: [id number]    Advanced Heart Failure Rounding Note   Subjective:    Decannulated yesterday.  Eating breakfast but still unable to lift his arms up to feed himself and requires assistance with all meals.  Gets frequently uncomfortable in bed and needs to be repositioned.  With PT/OT and nursing staff.  Remains very weak particularly on the right side but making some progress.  No chest pain or shortness of breath.  Objective:   Weight Range:  Vital Signs:   Temp:  [98 F (36.7 C)-98.5 F (36.9 C)] 98.1 F (36.7 C) (04/03 1104) Pulse Rate:  [88-103] 95 (04/03 0745) Resp:  [15-24] 18 (04/03 0800) BP: (113-148)/(60-90) 148/90 (04/03 0800) SpO2:  [91 %-100 %] 91 % (04/03 0800) Weight:  [119.5 kg] 119.5 kg (04/03 0500) Last BM Date: 11/08/19  Weight change: Filed Weights   11/06/19 0500 11/07/19 0437 11/09/19 0500  Weight: 118.2 kg 117.5 kg 119.5 kg    Intake/Output:   Intake/Output Summary (Last 24 hours) at 11/09/2019 1122 Last data filed at 11/09/2019 0700 Gross per 24 hour  Intake 1285 ml  Output 1950 ml  Net -665 ml     PHYSICAL EXAM: General: Sitting up in bed. No resp difficulty HEENT: normal Neck: supple. no JVD.  Trach site bandaged.  Carotids 2+ bilat; no bruits. No lymphadenopathy or thryomegaly appreciated. Cor: PMI nondisplaced. Regular rate & rhythm. No rubs, gallops or murmurs. Lungs: clear Abdomen: Obese.  Soft, nontender, nondistended. No hepatosplenomegaly. No bruits or masses. Good bowel sounds.  + G-tube Extremities: no cyanosis, clubbing, rash, edema Neuro: alert & orientedx3, cranial nerves grossly intact.  Weak in all 4 extremities right greater than left.  Affect pleasant.   Telemetry: SR 90-100s Personally reviewed   Labs: Basic Metabolic Panel: Recent Labs  Lab 11/03/19 0509 11/03/19 0509 11/05/19 0302 11/05/19 0302 11/06/19 0355 11/06/19 0355 11/07/19 0430  11/08/19 0311 11/09/19 0340  NA 140   < > 138  --  136  --  139 137 139  K 4.2   < > 4.2  --  3.8  --  4.3 4.4 4.2  CL 100   < > 98  --  95*  --  96* 96* 96*  CO2 30   < > 31  --  30  --  30 32 31  GLUCOSE 99   < > 109*  --  141*  --  105* 112* 148*  BUN 41*   < > 46*  --  50*  --  42* 53* 50*  CREATININE 0.54*   < > 0.60*  --  0.74  --  0.71 0.78 0.75  CALCIUM 9.6   < > 9.6   < > 9.3   < > 9.5 9.6 9.5  MG 1.7  --  1.7  --   --   --  1.7 1.9 1.8  PHOS 4.8*  --  4.7*  --   --   --  5.6* 4.6 4.7*   < > = values in this interval not displayed.    Liver Function Tests: No results for input(s): AST, ALT, ALKPHOS, BILITOT, PROT, ALBUMIN in the last 168 hours. No results for input(s): LIPASE, AMYLASE in the last 168 hours. No results for input(s): AMMONIA in the last 168 hours.  CBC: Recent Labs  Lab 11/05/19 0302 11/06/19 0355 11/07/19 0430 11/08/19 0311 11/09/19 0340  WBC 6.7 6.8  7.0 6.1 5.6  HGB 11.7* 11.3* 11.7* 11.9* 11.2*  HCT 38.3* 37.1* 38.6* 38.6* 36.8*  MCV 91.0 91.4 90.2 90.6 90.0  PLT 109* 102* 114* 120* 118*    Cardiac Enzymes: No results for input(s): CKTOTAL, CKMB, CKMBINDEX, TROPONINI in the last 168 hours.  BNP: BNP (last 3 results) Recent Labs    07/22/19 1039  BNP 15.3    ProBNP (last 3 results) No results for input(s): PROBNP in the last 8760 hours.    Other results:  Imaging: No results found.   Medications:     Scheduled Medications: . amLODipine  10 mg Per Tube Daily  . vitamin C  500 mg Per Tube Daily  . carvedilol  25 mg Per Tube BID WC  . chlorhexidine  15 mL Mouth Rinse BID  . Chlorhexidine Gluconate Cloth  6 each Topical Daily  . clonazepam  1 mg Per Tube QHS  . enoxaparin (LOVENOX) injection  60 mg Subcutaneous Q24H  . escitalopram  20 mg Per Tube Daily  . feeding supplement (ENSURE ENLIVE)  237 mL Oral BID BM  . feeding supplement (OSMOLITE 1.5 CAL)  1,000 mL Per Tube Q24H  . feeding supplement (PRO-STAT SUGAR FREE 64)  60  mL Per Tube QID  . Gerhardt's butt cream   Topical Daily  . insulin aspart  0-15 Units Subcutaneous Q6H  . mouth rinse  15 mL Mouth Rinse q12n4p  . melatonin  6 mg Per Tube QHS  . montelukast  10 mg Per Tube QHS  . nutrition supplement (JUVEN)  1 packet Per Tube BID BM  . pantoprazole sodium  40 mg Per Tube BID  . polycarbophil  625 mg Oral Daily  . psyllium  1 packet Oral TID  . sodium chloride flush  10-40 mL Intracatheter Q12H  . spironolactone  25 mg Oral Daily  . tamsulosin  0.4 mg Oral QPC supper  . traZODone  100 mg Per Tube QHS    Infusions: . sodium chloride Stopped (11/06/19 0214)    PRN Medications: sodium chloride, acetaminophen, albuterol, calcium carbonate, [COMPLETED] diazepam **FOLLOWED BY** diazepam, hydrALAZINE, HYDROmorphone, ipratropium-albuterol, loperamide HCl, ondansetron (ZOFRAN) IV, sodium chloride, sodium chloride flush   Assessment/Plan:    1. Acute hypoxic/hypercarbic respiratory failure due to COVID PNA: Remained markedly hypoxic despite full support. Intubated 12/26. S/p bilateral CTs 12/26 for pneumothoraces.  TEE on 12/26 LVEF 70% RV ok. VV ECMO begun 12/26. Tracheostomy 12/29.  COVID ARDS at this point. ECMO circuit changed 1/8 & 1/23. Crescent cannula placed 2/2 (femoral cannuals removed) - ECMO decannulated 3/4 - Trach decannulated 11/08/19 - O3 sats stable  2. COVID PNA: management of COVID infection per CCM. CXR with bilateral multifocal PNA progressing to likely ARDS.  - s/p 10 days of remdesivir - 12/16, 12/2 s/p tociluzimab - 12/17 s/p convalescent plasma - Decadron completed 1/11 - Resolved  3. FEN - Now on D3 diet. - Getting nocturnal tube feeds. - Continue PPI.   4. Hypernatremia -Resolved.   5. DTIs - On both feet.  Improving  6.  Hypokalemia. - Stable today. K 4.4  7.  Diffuse muscle weakness. -This is consistent with ICU/steroid/paralytics myopathy.  Continue passive range of motion exercises with PT.   -PT and OT are  following.  Both have recommended CIR.  CIR following closley and feels he is not ready yet.  They will reevaluate him on Monday. - Remains very weak PT/OT working with patient every other day. ICU nurses also doing exercises with  him 2x/day -Requires nurses to feed him every meal as his arms are too weak.  Also requiring frequent repositioning due to discomfort and avoidance of pressure ulcers.  8. HTN - Stable and controlled   10. Hypomag - Mag 1.8 - We will supplement again today.  11. Urinary retention - Improved. Continue Flomax   Length of Stay: [place] PA-C  11/09/2019, 11:22 AM  Advanced Heart Failure Team Pager [phone] (M-F; 7a - 4p)  Please contact [facility] for night-coverage after hours (4p -7a ) and weekends on [email or web address]

## 2019-11-10 DIAGNOSIS — R5381 Other malaise: Secondary | ICD-10-CM

## 2019-11-10 LAB — CBC
HCT: 37.7 % — ABNORMAL LOW (ref 39.0–52.0)
Hemoglobin: 11.5 g/dL — ABNORMAL LOW (ref 13.0–17.0)
MCH: 27.3 pg (ref 26.0–34.0)
MCHC: 30.5 g/dL (ref 30.0–36.0)
MCV: 89.3 fL (ref 80.0–100.0)
Platelets: 135 10*3/uL — ABNORMAL LOW (ref 150–400)
RBC: 4.22 MIL/uL (ref 4.22–5.81)
RDW: 15.2 % (ref 11.5–15.5)
WBC: 5.4 10*3/uL (ref 4.0–10.5)
nRBC: 0 % (ref 0.0–0.2)

## 2019-11-10 LAB — PHOSPHORUS: Phosphorus: 4.6 mg/dL (ref 2.5–4.6)

## 2019-11-10 LAB — BASIC METABOLIC PANEL
Anion gap: 12 (ref 5–15)
BUN: 51 mg/dL — ABNORMAL HIGH (ref 6–20)
CO2: 31 mmol/L (ref 22–32)
Calcium: 9.3 mg/dL (ref 8.9–10.3)
Chloride: 95 mmol/L — ABNORMAL LOW (ref 98–111)
Creatinine, Ser: 0.74 mg/dL (ref 0.61–1.24)
GFR calc Af Amer: >60 mL/min (ref >60)
GFR calc non Af Amer: >60 mL/min (ref >60)
Glucose, Bld: 127 mg/dL — ABNORMAL HIGH (ref 70–99)
Potassium: 4.2 mmol/L (ref 3.5–5.1)
Sodium: 138 mmol/L (ref 135–145)

## 2019-11-10 LAB — MAGNESIUM: Magnesium: 1.9 mg/dL (ref 1.7–2.4)

## 2019-11-10 LAB — GLUCOSE, CAPILLARY
Glucose-Capillary: 124 mg/dL — ABNORMAL HIGH (ref 70–99)
Glucose-Capillary: 120 mg/dL — ABNORMAL HIGH (ref 70–99)
Glucose-Capillary: 125 mg/dL — ABNORMAL HIGH (ref 70–99)

## 2019-11-10 MED ORDER — DIPHENHYDRAMINE HCL 50 MG/ML IJ SOLN
INTRAMUSCULAR | Status: AC
  Administered 2019-11-10: 25 mg via INTRAVENOUS
  Filled 2019-11-10: qty 1

## 2019-11-10 MED ORDER — DIPHENHYDRAMINE HCL 50 MG/ML IJ SOLN: 25.0000 mg | Freq: Once | INTRAMUSCULAR | Status: AC

## 2019-11-10 NOTE — Progress Notes (Signed)
Patient ID: [name], male   DOB: [birth year], 44 y.o.   MRN: [id number]    Advanced Heart Failure Rounding Note   Subjective:    Remains weak. Requiring intensive care for feeding and frequent repositioning due to pain.   Back and hips sore.   Had some irritation at trach site dressing.   Objective:   Weight Range:  Vital Signs:   Temp:  [97.8 F (36.6 C)-98.8 F (37.1 C)] 97.8 F (36.6 C) (04/04 1127) Pulse Rate:  [98-100] 98 (04/03 1951) Resp:  [15-21] 18 (04/04 0900) BP: (98-139)/(52-103) 116/103 (04/04 0900) SpO2:  [93 %-99 %] 99 % (04/04 0900) Weight:  [118.8 kg] 118.8 kg (04/04 0500) Last BM Date: 11/10/19  Weight change: Filed Weights   11/07/19 0437 11/09/19 0500 11/10/19 0500  Weight: 117.5 kg 119.5 kg 118.8 kg    Intake/Output:   Intake/Output Summary (Last 24 hours) at 11/10/2019 1236 Last data filed at 11/10/2019 0900 Gross per 24 hour  Intake 1565 ml  Output 1300 ml  Net 265 ml     PHYSICAL EXAM: General: Sitting up in bed. No resp difficulty HEENT: normal Neck: supple. no JVD. Irritation around previous ECMO cannula site and previous trach site. Carotids 2+ bilat; no bruits. No lymphadenopathy or thryomegaly appreciated. Cor: PMI nondisplaced. Regular rate & rhythm. No rubs, gallops or murmurs. Lungs: clear Abdomen: obese soft, nontender, nondistended. No hepatosplenomegaly. No bruits or masses. Good bowel sounds. + PEG Extremities: no cyanosis, clubbing, rash, edema eschar on both feet Neuro: alert & orientedx3, cranial nerves grossly intact. Weak in all 4 extremities. R>L Affect pleasant    Telemetry: SR 90-100s Personally reviewed   Labs: Basic Metabolic Panel: Recent Labs  Lab 11/05/19 0302 11/05/19 0302 11/06/19 0355 11/06/19 0355 11/07/19 0430 11/07/19 0430 11/08/19 0311 11/09/19 0340 11/10/19 0346  NA 138   < > 136  --  139  --  137 139 138  K 4.2   < > 3.8  --  4.3  --  4.4 4.2 4.2  CL 98   < > 95*  --  96*  --  96*  96* 95*  CO2 31   < > 30  --  30  --  32 31 31  GLUCOSE 109*   < > 141*  --  105*  --  112* 148* 127*  BUN 46*   < > 50*  --  42*  --  53* 50* 51*  CREATININE 0.60*   < > 0.74  --  0.71  --  0.78 0.75 0.74  CALCIUM 9.6   < > 9.3   < > 9.5   < > 9.6 9.5 9.3  MG 1.7  --   --   --  1.7  --  1.9 1.8 1.9  PHOS 4.7*  --   --   --  5.6*  --  4.6 4.7* 4.6   < > = values in this interval not displayed.    Liver Function Tests: No results for input(s): AST, ALT, ALKPHOS, BILITOT, PROT, ALBUMIN in the last 168 hours. No results for input(s): LIPASE, AMYLASE in the last 168 hours. No results for input(s): AMMONIA in the last 168 hours.  CBC: Recent Labs  Lab 11/06/19 0355 11/07/19 0430 11/08/19 0311 11/09/19 0340 11/10/19 0346  WBC 6.8 7.0 6.1 5.6 5.4  HGB 11.3* 11.7* 11.9* 11.2* 11.5*  HCT 37.1* 38.6* 38.6* 36.8* 37.7*  MCV 91.4 90.2 90.6 90.0 89.3  PLT 102* 114*  120* 118* 135*    Cardiac Enzymes: No results for input(s): CKTOTAL, CKMB, CKMBINDEX, TROPONINI in the last 168 hours.  BNP: BNP (last 3 results) Recent Labs    07/22/19 1039  BNP 15.3    ProBNP (last 3 results) No results for input(s): PROBNP in the last 8760 hours.    Other results:  Imaging: No results found.   Medications:     Scheduled Medications: . amLODipine  10 mg Per Tube Daily  . vitamin C  500 mg Per Tube Daily  . carvedilol  25 mg Per Tube BID WC  . chlorhexidine  15 mL Mouth Rinse BID  . Chlorhexidine Gluconate Cloth  6 each Topical Daily  . clonazepam  1 mg Per Tube QHS  . enoxaparin (LOVENOX) injection  60 mg Subcutaneous Q24H  . escitalopram  20 mg Per Tube Daily  . feeding supplement (ENSURE ENLIVE)  237 mL Oral BID BM  . feeding supplement (OSMOLITE 1.5 CAL)  1,000 mL Per Tube Q24H  . feeding supplement (PRO-STAT SUGAR FREE 64)  60 mL Per Tube QID  . Gerhardt's butt cream   Topical Daily  . insulin aspart  0-15 Units Subcutaneous Q6H  . loratadine  10 mg Oral Daily  . mouth rinse   15 mL Mouth Rinse q12n4p  . melatonin  6 mg Per Tube QHS  . montelukast  10 mg Per Tube QHS  . nutrition supplement (JUVEN)  1 packet Per Tube BID BM  . pantoprazole sodium  40 mg Per Tube BID  . polycarbophil  625 mg Oral Daily  . psyllium  1 packet Oral TID  . sodium chloride flush  10-40 mL Intracatheter Q12H  . spironolactone  25 mg Oral Daily  . tamsulosin  0.4 mg Oral QPC supper  . traZODone  100 mg Per Tube QHS    Infusions: . sodium chloride 250 mL (11/09/19 1258)    PRN Medications: sodium chloride, acetaminophen, albuterol, calcium carbonate, [COMPLETED] diazepam **FOLLOWED BY** diazepam, hydrALAZINE, HYDROmorphone, ipratropium-albuterol, loperamide HCl, ondansetron (ZOFRAN) IV, sodium chloride, sodium chloride flush   Assessment/Plan:    1. Acute hypoxic/hypercarbic respiratory failure due to COVID PNA: Remained markedly hypoxic despite full support. Intubated 12/26. S/p bilateral CTs 12/26 for pneumothoraces.  TEE on 12/26 LVEF 70% RV ok. VV ECMO begun 12/26. Tracheostomy 12/29.  COVID ARDS at this point. ECMO circuit changed 1/8 & 1/23. Crescent cannula placed 2/2 (femoral cannuals removed) - ECMO decannulated 3/4 - Trach decannulated 11/08/19 - O2 sats stable today. Wound care provided to previous trach site  2. COVID PNA: management of COVID infection per CCM. CXR with bilateral multifocal PNA progressing to likely ARDS.  - s/p 10 days of remdesivir - 12/16, 12/2 s/p tociluzimab - 12/17 s/p convalescent plasma - Decadron completed 1/11 - Resolved  3. FEN - Now on D3 diet. - Getting nocturnal tube feeds. - Continue PPI.  - no change   4. Hypernatremia -Resolved. Na 138  5. DTIs - On both feet.  Improving  6.  Hypokalemia. - Stable today. K 4.2  7.  Diffuse muscle weakness. -This is consistent with ICU/steroid/paralytics myopathy.  Continue passive range of motion exercises with PT.   -PT and OT are following.  Both have recommended CIR.  CIR  following [facility] and feels he is not ready yet.  They will reevaluate him on Monday. - Remains very weak PT/OT working with patient every other day. ICU nurses also doing exercises with him 2x/day -Requires nurses to feed  him every meal as his arms are too weak.  Also requiring frequent repositioning due to discomfort and avoidance of pressure ulcers. - No change. Repeat CIR eval tomorrow  8. HTN - Stable and controlled   10. Hypomag - Mag 1.9 - We will supplement today  11. Urinary retention - Improved. Continue Flomax   Length of Stay: 111  Glori Bickers MD 11/10/2019, 12:36 PM  Advanced Heart Failure Team Pager [phone] (M-F; 7a - 4p)  Please contact [facility] for night-coverage after hours (4p -7a ) and weekends on [email or web address]

## 2019-11-11 LAB — CBC
HCT: 37.5 % — ABNORMAL LOW (ref 39.0–52.0)
Hemoglobin: 11.6 g/dL — ABNORMAL LOW (ref 13.0–17.0)
MCH: 27.7 pg (ref 26.0–34.0)
MCHC: 30.9 g/dL (ref 30.0–36.0)
MCV: 89.5 fL (ref 80.0–100.0)
Platelets: 155 10*3/uL (ref 150–400)
RBC: 4.19 MIL/uL — ABNORMAL LOW (ref 4.22–5.81)
RDW: 15.1 % (ref 11.5–15.5)
WBC: 6.2 10*3/uL (ref 4.0–10.5)
nRBC: 0 % (ref 0.0–0.2)

## 2019-11-11 LAB — BASIC METABOLIC PANEL
Anion gap: 11 (ref 5–15)
BUN: 46 mg/dL — ABNORMAL HIGH (ref 6–20)
CO2: 31 mmol/L (ref 22–32)
Calcium: 9.4 mg/dL (ref 8.9–10.3)
Chloride: 96 mmol/L — ABNORMAL LOW (ref 98–111)
Creatinine, Ser: 0.77 mg/dL (ref 0.61–1.24)
GFR calc Af Amer: >60 mL/min (ref >60)
GFR calc non Af Amer: >60 mL/min (ref >60)
Glucose, Bld: 124 mg/dL — ABNORMAL HIGH (ref 70–99)
Potassium: 4.5 mmol/L (ref 3.5–5.1)
Sodium: 138 mmol/L (ref 135–145)

## 2019-11-11 LAB — MAGNESIUM: Magnesium: 1.7 mg/dL (ref 1.7–2.4)

## 2019-11-11 LAB — MRSA PCR SCREENING: MRSA by PCR: NEGATIVE

## 2019-11-11 LAB — GLUCOSE, CAPILLARY
Glucose-Capillary: 121 mg/dL — ABNORMAL HIGH (ref 70–99)
Glucose-Capillary: 127 mg/dL — ABNORMAL HIGH (ref 70–99)
Glucose-Capillary: 128 mg/dL — ABNORMAL HIGH (ref 70–99)

## 2019-11-11 LAB — PHOSPHORUS: Phosphorus: 5.3 mg/dL — ABNORMAL HIGH (ref 2.5–4.6)

## 2019-11-11 MED ORDER — MELATONIN 3 MG PO TABS
6.0000 mg | ORAL_TABLET | Freq: Every day | ORAL | Status: DC
  Administered 2019-11-11 – 2019-11-13 (×3): 6 mg via ORAL
  Filled 2019-11-11 (×3): qty 2

## 2019-11-11 MED ORDER — ENSURE ENLIVE PO LIQD
237.0000 mL | Freq: Three times a day (TID) | ORAL | Status: DC
  Administered 2019-11-11 – 2019-11-13 (×6): 237 mL via ORAL

## 2019-11-11 MED ORDER — OSMOLITE 1.5 CAL PO LIQD
900.0000 mL | ORAL | Status: DC
  Administered 2019-11-11 – 2019-11-13 (×3): 900 mL
  Filled 2019-11-11: qty 1000
  Filled 2019-11-11 (×2): qty 948
  Filled 2019-11-11 (×2): qty 1000

## 2019-11-11 MED ORDER — MAGNESIUM SULFATE 4 GM/100ML IV SOLN
4.0000 g | Freq: Once | INTRAVENOUS | Status: AC
  Administered 2019-11-11: 12:00:00 4 g via INTRAVENOUS
  Filled 2019-11-11: qty 100

## 2019-11-11 MED ORDER — TRAZODONE HCL 50 MG PO TABS
100.0000 mg | ORAL_TABLET | Freq: Every day | ORAL | Status: DC
  Administered 2019-11-11 – 2019-11-13 (×3): 100 mg via ORAL
  Filled 2019-11-11 (×3): qty 2

## 2019-11-11 MED ORDER — AMLODIPINE BESYLATE 10 MG PO TABS
10.0000 mg | ORAL_TABLET | Freq: Every day | ORAL | Status: DC
  Administered 2019-11-12 – 2019-11-14 (×3): 10 mg via ORAL
  Filled 2019-11-11 (×3): qty 1

## 2019-11-11 MED ORDER — ESCITALOPRAM OXALATE 10 MG PO TABS
20.0000 mg | ORAL_TABLET | Freq: Every day | ORAL | Status: DC
  Administered 2019-11-12 – 2019-11-14 (×3): 20 mg via ORAL
  Filled 2019-11-11 (×3): qty 2

## 2019-11-11 MED ORDER — CLONAZEPAM 0.5 MG PO TBDP
1.0000 mg | ORAL_TABLET | Freq: Every day | ORAL | Status: DC
  Administered 2019-11-11 – 2019-11-13 (×3): 1 mg via ORAL
  Filled 2019-11-11 (×3): qty 2

## 2019-11-11 MED ORDER — PANTOPRAZOLE SODIUM 40 MG PO TBEC
40.0000 mg | DELAYED_RELEASE_TABLET | Freq: Every day | ORAL | Status: DC
  Administered 2019-11-11 – 2019-11-14 (×4): 40 mg via ORAL
  Filled 2019-11-11 (×4): qty 1

## 2019-11-11 MED ORDER — CARVEDILOL 25 MG PO TABS
25.0000 mg | ORAL_TABLET | Freq: Two times a day (BID) | ORAL | Status: DC
  Administered 2019-11-11 – 2019-11-14 (×6): 25 mg via ORAL
  Filled 2019-11-11 (×6): qty 1

## 2019-11-11 MED ORDER — MONTELUKAST SODIUM 10 MG PO TABS
10.0000 mg | ORAL_TABLET | Freq: Every day | ORAL | Status: DC
  Administered 2019-11-11 – 2019-11-13 (×3): 10 mg via ORAL
  Filled 2019-11-11 (×3): qty 1

## 2019-11-11 MED ORDER — ASCORBIC ACID 500 MG PO TABS
500.0000 mg | ORAL_TABLET | Freq: Every day | ORAL | Status: DC
  Administered 2019-11-12 – 2019-11-14 (×3): 500 mg via ORAL
  Filled 2019-11-11 (×3): qty 1

## 2019-11-11 NOTE — Progress Notes (Addendum)
Occupational Therapy Treatment Patient Details Name: [name] MRN: [id number] DOB: Jun 03, 1976 Today's Date: 11/11/2019    History of present illness 44 y/o M admitted 12/14 with COVID pneumonia causing acute hypoxemic respiratory failure.   Developed pneumomediastinum 12/23 with concern for possible bilateral small pneumothoraces on 12/24. Pt intubated on 12/26 and started on ECMO on 12/27. Tracheostomy on 12/29. Pt on ECMO until 3/4. Pt on vent until 3/14 and trach removed 4/2.  PMH - asthma, htn, obesity, rt shoulder I&D.    OT comments  Seen bed level to focus on BUE strengthening and increasing independence with ADL. B shoulders taped using kinesiotape to improve scapulohumeral position due to apparent impingement. Pt able to hold onto utensil using red tubing (L hand). Requires mod A to scoop food and bring to mouth due to significant flexor weakness. Tray set up to allow for gravity to assist movement as possible. Addressed yonker use - Pt able to hold yonker with L hand and manipulate on/off switch independently. Pt placed in semi-reclined position and able to bring yonker to mouth once L elbow supported on 2  Pillows. Will see in pm with PT to advance mobility.   Follow Up Recommendations  CIR;Supervision/Assistance - 24 hour    Equipment Recommendations  Other (comment)(TBA)    Recommendations for Other Services Rehab consult    Precautions / Restrictions Precautions Precautions: Fall;Other (comment) Precaution Comments: PEG, trach; bilat foot wounds; + B shoulder impingement Required Braces or Orthoses: Other Brace Other Brace: B prevalon boots; R wrist cock-up splint       Mobility Bed Mobility               General bed mobility comments: Pt assisted with pulling forward in bed by pulling on bed bedrails after hands placed on rails  Transfers                      Balance     Sitting balance-Leahy Scale: Poor                                      ADL either performed or assessed with clinical judgement   ADL   Eating/Feeding: Sitting;Maximal assistance Eating/Feeding Details (indicate cue type and reason): red tubing placed on fork/spoon; pt able to stab food adn bring to mouth with mod A after proper set up of L elbow on 2 pillows; peaches taken out of bowl and place on flatter surface to increase ability to scoop food onto fork.spoon; using head thrust forward to help reach utnesil; lidded cup provided. Pt able to grasp using L hand Grooming: Maximal assistance;Wash/dry face;Oral care                                       Vision       Perception     Praxis      Cognition Arousal/Alertness: Awake/alert Behavior During Therapy: Anxious;WFL for tasks assessed/performed Overall Cognitive Status: Impaired/Different from baseline Area of Impairment: Memory;Awareness;Problem solving                   Current Attention Level: Selective Memory: Decreased short-term memory Following Commands: Follows one step commands consistently     Problem Solving: Slow processing General Comments: continues to demonstrate improvement  Exercises General Exercises - Upper Extremity Shoulder Flexion: AROM;AAROM;20 reps;Supine Shoulder Extension: AROM;AAROM;Strengthening;Both;20 reps;Supine Shoulder ABduction: AROM;AAROM;Both;15 reps;Supine Shoulder ADduction: AROM;AAROM;Strengthening;Both;15 reps;Supine Shoulder Horizontal ABduction: AAROM;AROM;20 reps;Supine Shoulder Horizontal ADduction: AROM;AAROM;Both;15 reps;Supine Elbow Flexion: PROM;AROM;AAROM;15 reps;Seated;Supine Theraband Level (Elbow Flexion): Level 2 (Red) Elbow Extension: AROM;AAROM;Both;Strengthening;15 reps;Supine Theraband Level (Elbow Extension): Level 2 (Red) Wrist Flexion: AROM;Strengthening;Both;15 reps;Seated Wrist Extension: PROM;AROM;AAROM;Both;15 reps;Seated Digit Composite Flexion: AROM;AAROM;PROM;Both;15  reps;Seated;Squeeze ball Composite Extension: PROM;AROM;AAROM;Strengthening;Both;15 reps Other Exercises Other Exercises: scapular retraction adn downward rotation  x 10 B Other Exercises: encouraged B grip strengthening with squeeze ball and putty Other Exercises: Completed hand to mouth movement x 20BUE - LUE continues to be stronger than R Other Exercises: B shoulders taped using kenisiotape - humeral head exteranlly roatated. anchor applied anterior shoulder over humeral head; 20 % stretch and anchored lower border of scapula to facilitate scapula retraction; additional tape anchored mid clavical area then pulled with 20% stretch over shoulder and down to facilitate scapular retraction and downward rotation.    Shoulder Instructions       General Comments      Pertinent Vitals/ Pain       Pain Assessment: Faces Faces Pain Scale: Hurts even more Pain Location: with B shoulder ab/flexion @ 80Degrees Pain Descriptors / Indicators: Grimacing;Guarding Pain Intervention(s): Limited activity within patient's tolerance;Repositioned;Other (comment)(K-tape used)  Home Living                                          Prior Functioning/Environment              Frequency  Min 3X/week        Progress Toward Goals  OT Goals(current goals can now be found in the care plan section)  Progress towards OT goals: Progressing toward goals  Acute Rehab OT Goals Patient Stated Goal: regain strength and independence OT Goal Formulation: With patient Time For Goal Achievement: 11/20/19 Potential to Achieve Goals: Good ADL Goals Pt Will Perform Eating: with mod assist;with adaptive utensils;sitting Pt Will Perform Grooming: with max assist;sitting;with adaptive equipment Pt Will Perform Upper Body Bathing: with max assist;sitting Pt/caregiver will Perform Home Exercise Program: Increased strength;Increased ROM;Both right and left upper extremity;With Supervision;With  written HEP provided Additional ADL Goal #1: Pt will maintain midline postural control in supported sitting x 5 min with min guard A in preparation for ADL training Additional ADL Goal #2: Pt will complete sit - stand activity with Max A +2 x 1 min with use of lift equipment  to increase strength for functional mobility Additional ADL Goal #3: Pt will be able to move Lt UE to mouth with mod A Additional ADL Goal #4: Pt will demonstrate B active elbow flexion in gravity eliminated position to increase funcitonal use of arms  Plan Discharge plan remains appropriate    Co-evaluation                 AM-PAC OT "6 Clicks" Daily Activity     Outcome Measure   Help from another person eating meals?: A Lot Help from another person taking care of personal grooming?: A Lot Help from another person toileting, which includes using toliet, bedpan, or urinal?: Total Help from another person bathing (including washing, rinsing, drying)?: Total Help from another person to put on and taking off regular upper body clothing?: Total Help from another person to put on and taking off regular lower body clothing?: Total  6 Click Score: 8    End of Session Equipment Utilized During Treatment: Oxygen(2L)  OT Visit Diagnosis: Muscle weakness (generalized) (M62.81);Other abnormalities of gait and mobility (R26.89);Other symptoms and signs involving cognitive function;Pain Pain - Right/Left: (B) Pain - part of body: Shoulder   Activity Tolerance Patient tolerated treatment well   Patient Left in bed;with call bell/phone within reach;with family/visitor present   Nurse Communication Mobility status;Other (comment)(proper set up for yonker use; encourage BUE hand activities)        Time: 1230-1330 OT Time Calculation (min): 60 min  1 visit  Charges: OT Treatments $Self Care/Home Management : 23-37 mins $Neuromuscular Re-education: 23-37 mins  Maurie Boettcher, OT/L   Acute OT Clinical  Specialist Acute Rehabilitation Services Pager [phone] Office [phone]    [facility] 11/11/2019, 4:56 PM

## 2019-11-11 NOTE — Progress Notes (Signed)
OT Treatment Note  Pt seen this pm in attempts to use walking sling to facilitate supported stepping with Maxisky and Harmon Pier walker. Unable to achieve appropriate positioning therefore session focused on trunk strengthening while seated EOB, weight shifting, BUE strenthening and ADL. After proper set up in chair, pt able to bring lidded cup to drink with use of bedside table and L elbow supported on 2 pillows. Pt encouraged that he could use his cup independently after set up. REcommend pt to sit up in chair/chair position as much as possible. VSS - tolerated well.    11/11/19 1700  OT Visit Information  Last OT Received On 11/11/19  Assistance Needed +3 or more  PT/OT/SLP Co-Evaluation/Treatment Yes  Reason for Co-Treatment Complexity of the patient's impairments (multi-system involvement);For patient/therapist safety;To address functional/ADL transfers  OT goals addressed during session ADL's and self-care;Strengthening/ROM  History of Present Illness 44 y/o M admitted 12/14 with COVID pneumonia causing acute hypoxemic respiratory failure.   Developed pneumomediastinum 12/23 with concern for possible bilateral small pneumothoraces on 12/24. Pt intubated on 12/26 and started on ECMO on 12/27. Tracheostomy on 12/29. Pt on ECMO until 3/4. Pt on vent until 3/14 and trach removed 4/2.  PMH - asthma, htn, obesity, rt shoulder I&D.   Precautions  Precautions Fall;Other (comment)  Precaution Comments PEG, trach; bilat foot wounds; + B shoulder impingement  Required Braces or Orthoses Other Brace  Other Brace B prevalon boots; R wrist cock-up splint  Pain Assessment  Pain Assessment Faces  Faces Pain Scale 6  Pain Location groin area; B knees  Pain Descriptors / Indicators Grimacing;Guarding  Pain Intervention(s) Limited activity within patient's tolerance;Repositioned (towel rolls placed under sling straps)  Cognition  Arousal/Alertness Awake/alert  Behavior During Therapy Anxious;WFL for tasks  assessed/performed  Overall Cognitive Status Within Functional Limits for tasks assessed  Problem Solving Slow processing  ADL  Eating/Feeding Sitting;Set up  Eating/Feeding Details (indicate cue type and reason) Once in chair, tray table placed over pt with pillows supporting L elbow and folded towel on chest to increase ability to prop cup on chest; with proper set up, pt able to grasp cup handle and bring cup to mouth to drink through straw  Bed Mobility  General bed mobility comments bed pad used to transition pt to EOB  Balance  Sitting balance-Leahy Scale Poor  Sitting balance - Comments Pt sat EOB with Swhen B feet supported; min guard A with any challenge. Once pt leans further out of bae of support requires at least min A  Transfers  General transfer comment Attempted to stand pt using walking sling and Maxisky with Ethelene Hal. Unable to achieve good position with sling at this time. Maxisky sling used at end of session to move pt to chair  Other Exercises  Other Exercises lateral leans  pushing up into upright sitting x 5 each side  Other Exercises B knees blocked, pt leaning forward and pushing through B hands when rocking forward  to attempt to clear buttocks from bed and push up through B LE - completed 10 reps with 3 rest breaks; pt appeared to achieve higher clearnace with repetition anduse of bed pad. Improved trunk control and ability to return to upright midline position  Other Exercises table top table slides BUE using pillow case  OT - End of Session  Equipment Utilized During Treatment Oxygen (2L)  Activity Tolerance Patient tolerated treatment well  Patient left with call bell/phone within reach;with family/visitor present;in chair  Nurse Communication Mobility  status;Need for lift equipment  OT Assessment/Plan  OT Plan Discharge plan remains appropriate  OT Visit Diagnosis Muscle weakness (generalized) (M62.81);Other abnormalities of gait and mobility (R26.89);Other  symptoms and signs involving cognitive function;Pain  Pain - part of body  (groin; B knees)  OT Frequency (ACUTE ONLY) Min 3X/week  Recommendations for Other Services Rehab consult  Follow Up Recommendations CIR;Supervision/Assistance - 24 hour  OT Equipment Other (comment)  AM-PAC OT "6 Clicks" Daily Activity Outcome Measure (Version 2)  Help from another person eating meals? 2  Help from another person taking care of personal grooming? 2  Help from another person toileting, which includes using toliet, bedpan, or urinal? 1  Help from another person bathing (including washing, rinsing, drying)? 1  Help from another person to put on and taking off regular upper body clothing? 1  Help from another person to put on and taking off regular lower body clothing? 1  6 Click Score 8  OT Goal Progression  Progress towards OT goals Progressing toward goals  Acute Rehab OT Goals  Patient Stated Goal regain strength and independence  OT Goal Formulation With patient  Time For Goal Achievement 11/20/19  Potential to Achieve Goals Good  ADL Goals  Pt Will Perform Grooming with max assist;sitting;with adaptive equipment  Additional ADL Goal #1 Pt will maintain midline postural control in supported sitting x 5 min with min guard A in preparation for ADL training  Additional ADL Goal #2 Pt will complete sit - stand activity with Max A +2 x 1 min with use of lift equipment  to increase strength for functional mobility  Additional ADL Goal #3 Pt will be able to move Lt UE to mouth with mod A  Additional ADL Goal #4 Pt will demonstrate B active elbow flexion in gravity eliminated position to increase funcitonal use of arms  Pt Will Perform Upper Body Bathing with max assist;sitting  Pt/caregiver will Perform Home Exercise Program Increased strength;Increased ROM;Both right and left upper extremity;With Supervision;With written HEP provided  Pt Will Perform Eating with mod assist;with adaptive  utensils;sitting  OT Time Calculation  OT Start Time (ACUTE ONLY) 1444  OT Stop Time (ACUTE ONLY) 1540  OT Time Calculation (min) 56 min  OT General Charges  $OT Visit 1 Visit  OT Treatments  $Self Care/Home Management  8-22 mins  $Therapeutic Exercise 8-22 mins  Maurie Boettcher, OT/L   Acute OT Clinical Specialist [place] Pager [phone] Office [phone]

## 2019-11-11 NOTE — Progress Notes (Addendum)
Patient ID: [name], male   DOB: 11/03/1975, 44 y.o.   MRN: [id number]    Advanced Heart Failure Rounding Note   Subjective:    Doing ok. No complaints this morning. Currently doing PT with bedside RN.   VSS. No events overnight.   Objective:   Weight Range:  Vital Signs:   Temp:  [97.7 F (36.5 C)-98.6 F (37 C)] 98.3 F (36.8 C) (04/05 0700) Resp:  [12-23] 14 (04/05 0900) BP: (97-139)/(54-88) 134/80 (04/05 0900) SpO2:  [87 %-100 %] 98 % (04/05 0900) Weight:  [119.8 kg] 119.8 kg (04/05 0600) Last BM Date: 11/10/19  Weight change: Filed Weights   11/09/19 0500 11/10/19 0500 11/11/19 0600  Weight: 119.5 kg 118.8 kg 119.8 kg    Intake/Output:   Intake/Output Summary (Last 24 hours) at 11/11/2019 1021 Last data filed at 11/11/2019 0900 Gross per 24 hour  Intake 1580 ml  Output 2900 ml  Net -1320 ml     PHYSICAL EXAM: General:  Well appearing obese WM. No respiratory difficulty HEENT: normal Neck: supple. no JVD. Carotids 2+ bilat; no bruits. No lymphadenopathy or thyromegaly appreciated. Cor: PMI nondisplaced. Regular rate & rhythm. No rubs, gallops or murmurs. Lungs: clear Abdomen: obese but soft, nontender, nondistended. No hepatosplenomegaly. No bruits or masses. Good bowel sounds. Extremities: no cyanosis, clubbing, rash, trace bilateral LE edema  Neuro: alert & oriented x 3, cranial nerves grossly intact. moves all 4 extremities w/o difficulty. Affect pleasant.   Telemetry: SR 90s- low 100s Personally reviewed   Labs: Basic Metabolic Panel: Recent Labs  Lab 11/07/19 0430 11/07/19 0430 11/08/19 0311 11/08/19 0311 11/09/19 0340 11/10/19 0346 11/11/19 0458  NA 139  --  137  --  139 138 138  K 4.3  --  4.4  --  4.2 4.2 4.5  CL 96*  --  96*  --  96* 95* 96*  CO2 30  --  32  --  31 31 31   GLUCOSE 105*  --  112*  --  148* 127* 124*  BUN 42*  --  53*  --  50* 51* 46*  CREATININE 0.71  --  0.78  --  0.75 0.74 0.77  CALCIUM 9.5   < > 9.6   < >  9.5 9.3 9.4  MG 1.7  --  1.9  --  1.8 1.9 1.7  PHOS 5.6*  --  4.6  --  4.7* 4.6 5.3*   < > = values in this interval not displayed.    Liver Function Tests: No results for input(s): AST, ALT, ALKPHOS, BILITOT, PROT, ALBUMIN in the last 168 hours. No results for input(s): LIPASE, AMYLASE in the last 168 hours. No results for input(s): AMMONIA in the last 168 hours.  CBC: Recent Labs  Lab 11/07/19 0430 11/08/19 0311 11/09/19 0340 11/10/19 0346 11/11/19 0458  WBC 7.0 6.1 5.6 5.4 6.2  HGB 11.7* 11.9* 11.2* 11.5* 11.6*  HCT 38.6* 38.6* 36.8* 37.7* 37.5*  MCV 90.2 90.6 90.0 89.3 89.5  PLT 114* 120* 118* 135* 155    Cardiac Enzymes: No results for input(s): CKTOTAL, CKMB, CKMBINDEX, TROPONINI in the last 168 hours.  BNP: BNP (last 3 results) Recent Labs    07/22/19 1039  BNP 15.3    ProBNP (last 3 results) No results for input(s): PROBNP in the last 8760 hours.    Other results:  Imaging: No results found.   Medications:     Scheduled Medications: . amLODipine  10 mg Per Tube  Daily  . vitamin C  500 mg Per Tube Daily  . carvedilol  25 mg Per Tube BID WC  . chlorhexidine  15 mL Mouth Rinse BID  . Chlorhexidine Gluconate Cloth  6 each Topical Daily  . clonazepam  1 mg Per Tube QHS  . enoxaparin (LOVENOX) injection  60 mg Subcutaneous Q24H  . escitalopram  20 mg Per Tube Daily  . feeding supplement (ENSURE ENLIVE)  237 mL Oral BID BM  . feeding supplement (OSMOLITE 1.5 CAL)  1,000 mL Per Tube Q24H  . feeding supplement (PRO-STAT SUGAR FREE 64)  60 mL Per Tube QID  . Gerhardt's butt cream   Topical Daily  . insulin aspart  0-15 Units Subcutaneous Q6H  . loratadine  10 mg Oral Daily  . mouth rinse  15 mL Mouth Rinse q12n4p  . melatonin  6 mg Per Tube QHS  . montelukast  10 mg Per Tube QHS  . nutrition supplement (JUVEN)  1 packet Per Tube BID BM  . pantoprazole sodium  40 mg Per Tube BID  . polycarbophil  625 mg Oral Daily  . psyllium  1 packet Oral TID  .  sodium chloride flush  10-40 mL Intracatheter Q12H  . spironolactone  25 mg Oral Daily  . tamsulosin  0.4 mg Oral QPC supper  . traZODone  100 mg Per Tube QHS    Infusions: . sodium chloride 250 mL (11/09/19 1258)    PRN Medications: sodium chloride, acetaminophen, albuterol, calcium carbonate, [COMPLETED] diazepam **FOLLOWED BY** diazepam, hydrALAZINE, HYDROmorphone, ipratropium-albuterol, loperamide HCl, ondansetron (ZOFRAN) IV, sodium chloride, sodium chloride flush   Assessment/Plan:    1. Acute hypoxic/hypercarbic respiratory failure due to COVID PNA: Remained markedly hypoxic despite full support. Intubated 12/26. S/p bilateral CTs 12/26 for pneumothoraces.  TEE on 12/26 LVEF 70% RV ok. VV ECMO begun 12/26. Tracheostomy 12/29.  COVID ARDS at this point. ECMO circuit changed 1/8 & 1/23. Crescent cannula placed 2/2 (femoral cannuals removed) - ECMO decannulated 3/4 - Trach decannulated 11/08/19 - O2 sats stable today. Wound care provided to previous trach site  2. COVID PNA: management of COVID infection per CCM. CXR with bilateral multifocal PNA progressing to likely ARDS.  - s/p 10 days of remdesivir - 12/16, 12/2 s/p tociluzimab - 12/17 s/p convalescent plasma - Decadron completed 1/11 - Resolved  3. FEN - Now on D3 diet. - Getting nocturnal tube feeds. - Continue PPI.  - no change   4. Hypernatremia -Resolved. Na 138  5. DTIs - On both feet.  Improving  6.  Hypokalemia. - Stable today. K 4.5  7.  Diffuse muscle weakness. -This is consistent with ICU/steroid/paralytics myopathy.  Continue passive range of motion exercises with PT.   -PT and OT are following.  Both have recommended CIR.  CIR following closley and feels he is not ready yet.  They will reevaluate him today - Remains very weak PT/OT working with patient every other day. ICU nurses also doing exercises with him 2x/day -Requires nurses to feed him every meal as his arms are too weak.  Also requiring  frequent repositioning due to discomfort and avoidance of pressure ulcers. - No change. Repeat CIR eval today   8. HTN - Stable and controlled   10. Hypomag - Mag 1.7 - We will supplement today  11. Urinary retention - Improved. Continue Flomax   Length of Stay: [place] PA-C 11/11/2019, 10:21 AM  Advanced Heart Failure Team Pager [phone] (M-F; 7a - 4p)  Please contact [facility] for night-coverage after hours (4p -7a ) and weekends on [email or web address]   Patient seen and examined with the above-signed Advanced Practice Provider and/or Housestaff. I personally reviewed laboratory data, imaging studies and relevant notes. I independently examined the patient and formulated the important aspects of the plan. I have edited the note to reflect any of my changes or salient points. I have personally discussed the plan with the patient and/or family.  Remains very weak.Requiring RN support for all meals, frequent repositioning and PT/OT assistance.  BP and HR stable. Remains on nocturnal TFs   General:  Weak appearing. No resp difficulty HEENT: normal Neck: supple. no JVD. Wound sites with mild erythema Carotids 2+ bilat; no bruits. No lymphadenopathy or thryomegaly appreciated. Cor: PMI nondisplaced. Regular rate & rhythm. No rubs, gallops or murmurs. Lungs: clear Abdomen: soft, nontender, nondistended. No hepatosplenomegaly. No bruits or masses. Good bowel sounds. + PEG  Extremities: no cyanosis, clubbing, rash, edema + eschar on feet  Neuro: alert & orientedx3, cranial nerves grossly intact. moves all 4 extremities w/o difficulty. Affect pleasant  Continue current support. CIR to re-evaluate today.   Glori Bickers, MD  10:42 AM

## 2019-11-11 NOTE — Progress Notes (Addendum)
Nutrition Follow up  DOCUMENTATION CODES:   Obesity unspecified  INTERVENTION:   -Increase Ensure Enlive po TID, each supplement provides 350 kcal and 20 grams of protein -Feeding assistance with each meal and supplement  Change nocturnal tube feeding:  -Osmolite 1.5 @ 75 ml/hr via PEG x 12 hrs (1800-0600) -60 ml Prostat QID -Juven BID   At goal TF provides: 2310 kcal, 181 grams protein, and 686 ml free water. Meets 76% kcal needs and 100% of protein needs.   NUTRITION DIAGNOSIS:   Increased nutrient needs related to acute illness(COVID PNA) as evidenced by estimated needs.  Ongoing  GOAL:   Patient will meet greater than or equal to 90% of their needs   Addressed via TF  MONITOR:   Diet advancement, Skin, TF tolerance, Weight trends, Labs, I & O's  REASON FOR ASSESSMENT:   Consult Enteral/tube feeding initiation and management  ASSESSMENT:   Patient with PMH significant for GERD, HTN, and asthma. Presents this admission with COVID-19 PNA.   12/14- admit 12/23- chest xray revealed pneumomediastinum  12/24- developed bilateral pneumothoraces  12/26- worsening hypoxemia, intubated  12/27- s/p VV fem/fem ECMO cannulation  12/29- s/p trach  1/5- nasal bleeding- packing per CCM 1/7- nasal packing per ENT 1/8- trach exchanged, ECMO circuit changed  1/20- s/p additional RIJ drainage cannula  1/21- Cortrak kinked, replaced by OGT, pt vomited over night 1/22- ECMO circuit exchanged  1/23- nasal endoscopy, control of epistaxis  2/2- TEE, Cortrak removed, change to crescent catheter, removal of femoral lines 2/3- s/p PEG, EGD 2/15- developed recurrent PNA/sepsis  3/2- decannulation cancelled 3/4- s/p decannulation VV ECMO 3/7- PEG dislodged  3/8- s/p PEG replacement by IR 4/2- s/p trach decannulation   Pt discussed during ICU rounds and with RN.   Appetite progressing. Meal completions charted as 35-100% for his last eight meals (70% average). Documentation  lacking at some meal times making it difficult to determine if pt is consuming every meal.   Pt tolerating current nocturnal feedings. Plan to make small adjustments in nocturnal feeding as PO intake increases. Recommend continuation of tube feeding with simultaneous PO intake as nutrition requirements remain increased.   Wounds stable per RN. Continues daily work with PT/OT. Plan reevaluation for CIR tomorrow.   Admission weight: 136.1 kg  Current weight: 119.8 kg (stable over last week)  I/O: +8,768 ml since 3/22 UOP: 3,050 ml x 24 hrs   Medications: 500 mg Vitamin C, SS novolog, fibercon, metamucil Labs: Phosphorus 5.3 (H)- fluctuates CBG 120-143  Diet Order:   Diet Order            DIET DYS 3 Room service appropriate? Yes with Assist; Fluid consistency: Thin  Diet effective now              EDUCATION NEEDS:   Not appropriate for education at this time  Skin:  Skin Assessment: Skin Integrity Issues: Skin Integrity Issues:: DTI, Stage II, Stage III DTI: bilateral heels Stage II: buttocks Stage III: n/a Incisions: L wrist, neck Other: n/a  Last BM:  4/4  Height:   Ht Readings from Last 1 Encounters:  08/09/19 5' 8"  (1.727 m)    Weight:   Wt Readings from Last 1 Encounters:  11/11/19 119.8 kg    Ideal Body Weight:  70 kg  BMI:  Body mass index is 40.16 kg/m.  Estimated Nutritional Needs:   Kcal:  3888-2800 kcal (25-30 kcal/kg)  Protein:  180-215 grams  Fluid:  >/= 2.5 L/day  [name] RD, LDN Clinical Nutrition Pager # [phone]

## 2019-11-11 NOTE — Progress Notes (Signed)
Physical Therapy Treatment Patient Details Name: [name] MRN: [id number] DOB: 02/07/1976 Today's Date: 11/11/2019    History of Present Illness 44 y/o M admitted 12/14 with COVID pneumonia causing acute hypoxemic respiratory failure.   Developed pneumomediastinum 12/23 with concern for possible bilateral small pneumothoraces on 12/24. Pt intubated on 12/26 and started on ECMO on 12/27. Tracheostomy on 12/29. Pt on ECMO until 3/4. Pt on vent until 3/14 and trach removed 4/2.  PMH - asthma, htn, obesity, rt shoulder I&D.     PT Comments    Patient progressing this session with anterior weight shifts and weight loading onto his feet for lift off with hips from EOB.  Was unable to utilize walking sling for maxisky to stand to [place] walker as per plan with OT, but will problem solve to utilize differently for another attempt next session.  Pt remains appropriate for CIR level rehab at d/c.   Goals updated this session with pt able to achieve goal for supine to sit and for EOB sitting balance.    Follow Up Recommendations  CIR;Supervision/Assistance - 24 hour     Equipment Recommendations  Other (comment)(TBA next venue)    Recommendations for Other Services       Precautions / Restrictions Precautions Precautions: Fall;Other (comment) Precaution Comments: PEG, bilat foot wounds, B shoulder impingement Required Braces or Orthoses: Other Brace Other Brace: B prevalon boots; R wrist cock-up splint Restrictions Weight Bearing Restrictions: No    Mobility  Bed Mobility Overal bed mobility: Needs Assistance Bed Mobility: Supine to Sit     Supine to sit: HOB elevated;Mod assist;+2 for safety/equipment     General bed mobility comments: assisted to EOB with bed pad under pt after placing walking sling under pt in bed  Transfers Overall transfer level: Needs assistance Equipment used: Ambulation equipment used Transfers: Sit to/from Stand           General transfer comment:  Attempted to stand pt using walking sling and Maxisky with Ethelene Hal. Unable to achieve good position with sling at this time. Maxisky sling used at end of session to move pt to chair  Ambulation/Gait                 Stairs             Wheelchair Mobility    Modified Rankin (Stroke Patients Only)       Balance Overall balance assessment: Needs assistance Sitting-balance support: Feet supported;Bilateral upper extremity supported Sitting balance-Leahy Scale: Poor Sitting balance - Comments: Pt sat EOB with S when B feet supported; min guard A with any challenge. Once pt leans further out of bae of support requires at least min A                                    Cognition Arousal/Alertness: Awake/alert Behavior During Therapy: Anxious;WFL for tasks assessed/performed Overall Cognitive Status: Within Functional Limits for tasks assessed Area of Impairment: Memory;Awareness;Problem solving                   Current Attention Level: Selective Memory: Decreased short-term memory Following Commands: Follows one step commands consistently     Problem Solving: Slow processing General Comments: continues to demonstrate improvement      Exercises General Exercises - Upper Extremity Shoulder Flexion: AROM;AAROM;20 reps;Supine Shoulder Extension: AROM;AAROM;Strengthening;Both;20 reps;Supine Shoulder ABduction: AROM;AAROM;Both;15 reps;Supine Shoulder ADduction: AROM;AAROM;Strengthening;Both;15 reps;Supine Shoulder Horizontal ABduction: AAROM;AROM;20  reps;Supine Shoulder Horizontal ADduction: AROM;AAROM;Both;15 reps;Supine Elbow Flexion: PROM;AROM;AAROM;15 reps;Seated;Supine Theraband Level (Elbow Flexion): Level 2 (Red) Elbow Extension: AROM;AAROM;Both;Strengthening;15 reps;Supine Theraband Level (Elbow Extension): Level 2 (Red) Wrist Flexion: AROM;Strengthening;Both;15 reps;Seated Wrist Extension: PROM;AROM;AAROM;Both;15 reps;Seated Digit  Composite Flexion: AROM;AAROM;PROM;Both;15 reps;Seated;Squeeze ball Composite Extension: PROM;AROM;AAROM;Strengthening;Both;15 reps General Exercises - Lower Extremity Short Arc QuadSinclair Ship;Both;5 reps;Seated(with hold at top) Other Exercises Other Exercises: lateral leans  pushing up into upright sitting x 5 each side Other Exercises: B knees blocked, pt leaning forward and pushing through B hands when rocking forward  to attempt to clear buttocks from bed and push up through B LE - completed 10 reps with 3 rest breaks; pt appeared to achieve higher clearnace with repetition anduse of bed pad. Improved trunk control and ability to return to upright midline position Other Exercises: table top table slides BUE using pillow case Other Exercises: B shoulders taped using kenisiotape - humeral head exteranlly roatated. anchor applied anterior shoulder over humeral head; 20 % stretch and anchored lower border of scapula to facilitate scapula retraction; additional tape anchored mid clavical area then pulled with 20% stretch over shoulder and down to facilitate scapular retraction and downward rotation.     General Comments General comments (skin integrity, edema, etc.): VSS on 2L O2      Pertinent Vitals/Pain Pain Assessment: Faces Faces Pain Scale: Hurts even more Pain Location: groin area; B knees Pain Descriptors / Indicators: Grimacing;Guarding Pain Intervention(s): Monitored during session;Repositioned(padding for sling straps)    Home Living                      Prior Function            PT Goals (current goals can now be found in the care plan section) Acute Rehab PT Goals Patient Stated Goal: regain strength and independence PT Goal Formulation: With patient/family Time For Goal Achievement: 11/25/19 Potential to Achieve Goals: Good Progress towards PT goals: Progressing toward goals    Frequency    Min 3X/week      PT Plan Current plan remains appropriate     Co-evaluation PT/OT/SLP Co-Evaluation/Treatment: Yes Reason for Co-Treatment: Complexity of the patient's impairments (multi-system involvement);For patient/therapist safety;To address functional/ADL transfers PT goals addressed during session: Mobility/safety with mobility;Balance;Strengthening/ROM OT goals addressed during session: ADL's and self-care;Strengthening/ROM      AM-PAC PT "6 Clicks" Mobility   Outcome Measure  Help needed turning from your back to your side while in a flat bed without using bedrails?: A Lot Help needed moving from lying on your back to sitting on the side of a flat bed without using bedrails?: A Lot Help needed moving to and from a bed to a chair (including a wheelchair)?: Total Help needed standing up from a chair using your arms (e.g., wheelchair or bedside chair)?: Total Help needed to walk in hospital room?: Total Help needed climbing 3-5 steps with a railing? : Total 6 Click Score: 8    End of Session   Activity Tolerance: Patient tolerated treatment well Patient left: in chair;with call bell/phone within reach   PT Visit Diagnosis: Muscle weakness (generalized) (M62.81);Other symptoms and signs involving the nervous system (R29.898);Other abnormalities of gait and mobility (R26.89)     Time: 6948-5462 PT Time Calculation (min) (ACUTE ONLY): 60 min  Charges:  $Therapeutic Exercise: 8-22 mins $Therapeutic Activity: 8-22 mins                     Magda Kiel, PT Acute Rehabilitation  Services [phone] 11/11/2019    Reginia Naas 11/11/2019, 5:39 PM

## 2019-11-11 NOTE — Progress Notes (Signed)
Inpatient Rehab Admissions Coordinator:   Followed up with pt at bedside.  Dr. Naaman Plummer off today; will ask him to check in on pt tomorrow.  Danne [name] will resume pt's case tomorrow.   Shann Medal, PT, DPT Admissions Coordinator [phone] 11/11/19  11:22 AM

## 2019-11-12 LAB — GLUCOSE, CAPILLARY
Glucose-Capillary: 105 mg/dL — ABNORMAL HIGH (ref 70–99)
Glucose-Capillary: 133 mg/dL — ABNORMAL HIGH (ref 70–99)
Glucose-Capillary: 136 mg/dL — ABNORMAL HIGH (ref 70–99)
Glucose-Capillary: 140 mg/dL — ABNORMAL HIGH (ref 70–99)
Glucose-Capillary: 82 mg/dL (ref 70–99)

## 2019-11-12 NOTE — Progress Notes (Addendum)
Patient ID: [name], male   DOB: 12-Mar-1976, 44 y.o.   MRN: [id number]    Advanced Heart Failure Rounding Note   Subjective:    Sitting up in bed eating breakfast, nursing staff assisting w/ feeding.   No cardiac complaints today.  Feels ok. Only complaint is bilateral knee discomfort from working w/ PT  Objective:   Weight Range:  Vital Signs:   Temp:  [97.9 F (36.6 C)-98.7 F (37.1 C)] 97.9 F (36.6 C) (04/06 0800) Pulse Rate:  [85-95] 85 (04/06 0826) Resp:  [13-24] 16 (04/06 0700) BP: (86-144)/(59-95) 126/78 (04/06 0904) SpO2:  [85 %-100 %] 99 % (04/06 0700) Weight:  [120 kg] 120 kg (04/06 0500) Last BM Date: 11/10/19  Weight change: Filed Weights   11/10/19 0500 11/11/19 0600 11/12/19 0500  Weight: 118.8 kg 119.8 kg 120 kg    Intake/Output:   Intake/Output Summary (Last 24 hours) at 11/12/2019 1009 Last data filed at 11/12/2019 0600 Gross per 24 hour  Intake 578.96 ml  Output 2255 ml  Net -1676.04 ml     PHYSICAL EXAM: General:  Well appearing obese WM. Sitting up in bed. No respiratory difficulty HEENT: normal anicteric  Neck: supple. no JVD. Carotids 2+ bilat; no bruits. No lymphadenopathy or thyromegaly appreciated. Wound sites healing  Cor: PMI nondisplaced. Regular rate & rhythm. No rubs, gallops or murmurs. Lungs: clear, no wheezing Abdomen: obese but soft, nontender, nondistended. No hepatosplenomegaly. No bruits or masses. Good bowel sounds. +PEG Extremities: no cyanosis, clubbing, rash, trace bilateral LE edema  Neuro: alert & oriented x 3, cranial nerves grossly intact. Remains weak in all 4 extremities Affect pleasant.   Telemetry: NSR 90s Personally reviewed   Labs: Basic Metabolic Panel: Recent Labs  Lab 11/07/19 0430 11/07/19 0430 11/08/19 0311 11/08/19 0311 11/09/19 0340 11/10/19 0346 11/11/19 0458  NA 139  --  137  --  139 138 138  K 4.3  --  4.4  --  4.2 4.2 4.5  CL 96*  --  96*  --  96* 95* 96*  CO2 30  --  32  --  31  31 31   GLUCOSE 105*  --  112*  --  148* 127* 124*  BUN 42*  --  53*  --  50* 51* 46*  CREATININE 0.71  --  0.78  --  0.75 0.74 0.77  CALCIUM 9.5   < > 9.6   < > 9.5 9.3 9.4  MG 1.7  --  1.9  --  1.8 1.9 1.7  PHOS 5.6*  --  4.6  --  4.7* 4.6 5.3*   < > = values in this interval not displayed.    Liver Function Tests: No results for input(s): AST, ALT, ALKPHOS, BILITOT, PROT, ALBUMIN in the last 168 hours. No results for input(s): LIPASE, AMYLASE in the last 168 hours. No results for input(s): AMMONIA in the last 168 hours.  CBC: Recent Labs  Lab 11/07/19 0430 11/08/19 0311 11/09/19 0340 11/10/19 0346 11/11/19 0458  WBC 7.0 6.1 5.6 5.4 6.2  HGB 11.7* 11.9* 11.2* 11.5* 11.6*  HCT 38.6* 38.6* 36.8* 37.7* 37.5*  MCV 90.2 90.6 90.0 89.3 89.5  PLT 114* 120* 118* 135* 155    Cardiac Enzymes: No results for input(s): CKTOTAL, CKMB, CKMBINDEX, TROPONINI in the last 168 hours.  BNP: BNP (last 3 results) Recent Labs    07/22/19 1039  BNP 15.3    ProBNP (last 3 results) No results for input(s): PROBNP in the  last 8760 hours.    Other results:  Imaging: No results found.   Medications:     Scheduled Medications: . amLODipine  10 mg Oral Daily  . vitamin C  500 mg Oral Daily  . carvedilol  25 mg Oral BID WC  . chlorhexidine  15 mL Mouth Rinse BID  . Chlorhexidine Gluconate Cloth  6 each Topical Daily  . clonazepam  1 mg Oral QHS  . enoxaparin (LOVENOX) injection  60 mg Subcutaneous Q24H  . escitalopram  20 mg Oral Daily  . feeding supplement (ENSURE ENLIVE)  237 mL Oral TID BM  . feeding supplement (OSMOLITE 1.5 CAL)  900 mL Per Tube Q24H  . feeding supplement (PRO-STAT SUGAR FREE 64)  60 mL Per Tube QID  . Gerhardt's butt cream   Topical Daily  . insulin aspart  0-15 Units Subcutaneous Q6H  . loratadine  10 mg Oral Daily  . mouth rinse  15 mL Mouth Rinse q12n4p  . melatonin  6 mg Oral QHS  . montelukast  10 mg Oral QHS  . nutrition supplement (JUVEN)  1  packet Per Tube BID BM  . pantoprazole  40 mg Oral Daily  . polycarbophil  625 mg Oral Daily  . psyllium  1 packet Oral TID  . sodium chloride flush  10-40 mL Intracatheter Q12H  . spironolactone  25 mg Oral Daily  . tamsulosin  0.4 mg Oral QPC supper  . traZODone  100 mg Oral QHS    Infusions: . sodium chloride 250 mL (11/09/19 1258)    PRN Medications: sodium chloride, acetaminophen, albuterol, calcium carbonate, [COMPLETED] diazepam **FOLLOWED BY** diazepam, hydrALAZINE, HYDROmorphone, ipratropium-albuterol, loperamide HCl, ondansetron (ZOFRAN) IV, sodium chloride, sodium chloride flush   Assessment/Plan:    1. Acute hypoxic/hypercarbic respiratory failure due to COVID PNA: Remained markedly hypoxic despite full support. Intubated 12/26. S/p bilateral CTs 12/26 for pneumothoraces.  TEE on 12/26 LVEF 70% RV ok. VV ECMO begun 12/26. Tracheostomy 12/29.  COVID ARDS at this point. ECMO circuit changed 1/8 & 1/23. Crescent cannula placed 2/2 (femoral cannuals removed) - ECMO decannulated 3/4 - Trach decannulated 11/08/19 - O2 sats stable today. Wound care provided to previous trach site  2. COVID PNA: management of COVID infection per CCM. CXR with bilateral multifocal PNA progressing to likely ARDS.  - s/p 10 days of remdesivir - 12/16, 12/2 s/p tociluzimab - 12/17 s/p convalescent plasma - Decadron completed 1/11 - Resolved  3. FEN - Now on D3 diet. - Getting nocturnal tube feeds via PEG - Continue PPI.  - no change   4. Hypernatremia -Resolved. Na 138  5. DTIs - On both feet.  Improving  6.  Hypokalemia. - Stable today.   7.  Diffuse muscle weakness. -This is consistent with ICU/steroid/paralytics myopathy.  Continue passive range of motion exercises with PT.   -PT and OT are following.  Both have recommended CIR.  CIR following closley and feels he is not ready yet.  They will reevaluate him today - Remains very weak PT/OT working with patient every other day. ICU  nurses also doing exercises with him 2x/day -Requires nurses to feed him every meal as his arms are too weak.  Also requiring frequent repositioning due to discomfort and avoidance of pressure ulcers. - No change. Repeat CIR eval today   8. HTN - Stable and controlled   10. Urinary retention - Improved. Continue Flomax  Continue current support. CIR to re-evaluate today. Dr. Naaman Plummer to see.   Length  of Stay: [address] PA-C 11/12/2019, 10:09 AM  Advanced Heart Failure Team Pager [phone] (M-F; 7a - 4p)  Please contact [facility] for night-coverage after hours (4p -7a ) and weekends on [email or web address]  Patient seen and examined with the above-signed Advanced Practice Provider and/or Housestaff. I personally reviewed laboratory data, imaging studies and relevant notes. I independently examined the patient and formulated the important aspects of the plan. I have edited the note to reflect any of my changes or salient points. I have personally discussed the plan with the patient and/or family.  He remains relatively stable but continues to require extensive nursing attention and care for feeding and frequent repositioning.  Working with PT/OT.   I discussed with CIR admission coordinator and will re-eval for rehab admission later in the week.   [name] Bickers, MD  6:49 PM

## 2019-11-12 NOTE — Progress Notes (Signed)
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  Mr. Maffett is demonstrating increased proximal muscle strength in all 4's. Therapy has been working on transfers with PT/OT. He remains very motivated. I believe we could target the end of this week to transition to inpatient rehab, starting at 2hrs of therapy per day/15hrs per week. Admission coordinator will initiate prior authorization with insurance. I would estimate a 21-28 day LOS with goals of mod to perhaps min assist.   Thanks    Meredith Staggers, MD, [facility] 11/12/2019

## 2019-11-12 NOTE — Progress Notes (Signed)
Inpatient Rehabilitation Admissions Coordinator  I met with patient and his wife at bedside to discuss rehab goals and expectations after Dr. Naaman Plummer assessed patient today. I will begin insurance authorization with [employer] for a possible admit pending their approval .  Danne [name], RN, MSN Rehab Admissions Coordinator [phone] 11/12/2019 2:18 PM

## 2019-11-12 NOTE — Progress Notes (Signed)
Physical Therapy Treatment Patient Details Name: [name] MRN: [id number] DOB: 03-27-1976 Today's Date: 11/12/2019    History of Present Illness 44 y/o M admitted 12/14 with COVID pneumonia causing acute hypoxemic respiratory failure.   Developed pneumomediastinum 12/23 with concern for possible bilateral small pneumothoraces on 12/24. Pt intubated on 12/26 and started on ECMO on 12/27. Tracheostomy on 12/29. Pt on ECMO until 3/4. Pt on vent until 3/14 and trach removed 4/2.  PMH - asthma, htn, obesity, rt shoulder I&D.     PT Comments    Patient progressing this session, able to achieve lifting off several times during session and noted improved tolerance as compared to yesterday with attempts at standing from chair rather than the bed.  Feel he continues to be a good candidate for CIR level rehab when stable.   Follow Up Recommendations  CIR;Supervision/Assistance - 24 hour     Equipment Recommendations  Other (comment)(TBA)    Recommendations for Other Services       Precautions / Restrictions Precautions Precautions: Fall;Other (comment) Precaution Comments: PEG, bilat foot wounds, B shoulder impingement Required Braces or Orthoses: Other Brace Other Brace: B prevalon boots; R wrist cock-up splint Restrictions Weight Bearing Restrictions: No    Mobility  Bed Mobility Overal bed mobility: Needs Assistance Bed Mobility: Rolling Rolling: Mod assist;+2 for physical assistance         General bed mobility comments: rolling to place lift pad  Transfers Overall transfer level: Needs assistance Equipment used: 2 person hand held assist Transfers: Sit to/from Stand Sit to Stand: Max assist;+2 physical assistance         General transfer comment: bed to chair via maxisky lift; once in recliner donned shoes and patient assisted to scoot to edge of chair, sit to squat x 5 with +2 A lifting with pad under hips, using momentum and blocking knees  Ambulation/Gait                  Stairs             Wheelchair Mobility    Modified Rankin (Stroke Patients Only)       Balance Overall balance assessment: Needs assistance Sitting-balance support: Feet supported Sitting balance-Leahy Scale: Poor Sitting balance - Comments: min A for safety with balance at edge of chair                                    Cognition Arousal/Alertness: Awake/alert Behavior During Therapy: Flat affect                       Current Attention Level: Selective Memory: Decreased short-term memory       Problem Solving: Slow processing        Exercises      General Comments General comments (skin integrity, edema, etc.): VSS on 2L O2; when removed prevlon boots noted areas of bleeding on feet, RN reports WOC RN performed debridement today.  Placed Mepilex foot dressings on feet prior to donning shoes      Pertinent Vitals/Pain Pain Assessment: Faces Faces Pain Scale: Hurts even more Pain Location: groin area; B knees Pain Descriptors / Indicators: Grimacing;Guarding Pain Intervention(s): Monitored during session;Repositioned    Home Living                      Prior Function  PT Goals (current goals can now be found in the care plan section) Progress towards PT goals: Progressing toward goals    Frequency    Min 3X/week      PT Plan Current plan remains appropriate    Co-evaluation              AM-PAC PT "6 Clicks" Mobility   Outcome Measure  Help needed turning from your back to your side while in a flat bed without using bedrails?: A Lot Help needed moving from lying on your back to sitting on the side of a flat bed without using bedrails?: A Lot Help needed moving to and from a bed to a chair (including a wheelchair)?: Total Help needed standing up from a chair using your arms (e.g., wheelchair or bedside chair)?: Total Help needed to walk in hospital room?: Total Help  needed climbing 3-5 steps with a railing? : Total 6 Click Score: 8    End of Session Equipment Utilized During Treatment: Oxygen Activity Tolerance: Patient tolerated treatment well Patient left: in chair;with call bell/phone within reach Nurse Communication: Mobility status;Need for lift equipment PT Visit Diagnosis: Muscle weakness (generalized) (M62.81);Other symptoms and signs involving the nervous system (R29.898);Other abnormalities of gait and mobility (R26.89)     Time: 1500-1540 PT Time Calculation (min) (ACUTE ONLY): 40 min  Charges:  $Therapeutic Exercise: 8-22 mins $Therapeutic Activity: 23-37 mins                     [name], [facility] [phone] 11/12/2019    [name] 11/12/2019, 5:55 PM

## 2019-11-12 NOTE — Consult Note (Signed)
[facility] Nurse wound follow up Patient receiving care in [place]. Wound type: Resolving DTPI to bilateral heels and lateral feet Measurement: Right foot eschar measures 17 cm x 6 cm.  Left foot eschar measures 20 cm x 6 cm Wound bed: 100% dry, hard, black, eschar Drainage (amount, consistency, odor) none Periwound: intact Dressing procedure/placement/frequency: Continue iodine/betadine to eschar. Conservative sharp wound debridement (CSWD performed at the bedside): of easily lifted, peeling black eschar. Monitor the wound area(s) for worsening of condition such as: Signs/symptoms of infection,  Increase in size,  Development of or worsening of odor, Development of pain, or increased pain at the affected locations.  Notify the medical team if any of these develop. Val Riles, RN, MSN, CWOCN, CNS-BC, pager [phone]

## 2019-11-13 LAB — CBC
HCT: 40.6 % (ref 39.0–52.0)
Hemoglobin: 12.4 g/dL — ABNORMAL LOW (ref 13.0–17.0)
MCH: 27.3 pg (ref 26.0–34.0)
MCHC: 30.5 g/dL (ref 30.0–36.0)
MCV: 89.4 fL (ref 80.0–100.0)
Platelets: 181 10*3/uL (ref 150–400)
RBC: 4.54 MIL/uL (ref 4.22–5.81)
RDW: 14.6 % (ref 11.5–15.5)
WBC: 7.4 10*3/uL (ref 4.0–10.5)
nRBC: 0 % (ref 0.0–0.2)

## 2019-11-13 LAB — GLUCOSE, CAPILLARY
Glucose-Capillary: 120 mg/dL — ABNORMAL HIGH (ref 70–99)
Glucose-Capillary: 125 mg/dL — ABNORMAL HIGH (ref 70–99)
Glucose-Capillary: 127 mg/dL — ABNORMAL HIGH (ref 70–99)
Glucose-Capillary: 137 mg/dL — ABNORMAL HIGH (ref 70–99)

## 2019-11-13 LAB — BASIC METABOLIC PANEL
Anion gap: 11 (ref 5–15)
BUN: 46 mg/dL — ABNORMAL HIGH (ref 6–20)
CO2: 29 mmol/L (ref 22–32)
Calcium: 9.6 mg/dL (ref 8.9–10.3)
Chloride: 98 mmol/L (ref 98–111)
Creatinine, Ser: 0.76 mg/dL (ref 0.61–1.24)
GFR calc Af Amer: >60 mL/min (ref >60)
GFR calc non Af Amer: >60 mL/min (ref >60)
Glucose, Bld: 139 mg/dL — ABNORMAL HIGH (ref 70–99)
Potassium: 4.5 mmol/L (ref 3.5–5.1)
Sodium: 138 mmol/L (ref 135–145)

## 2019-11-13 LAB — MAGNESIUM: Magnesium: 1.7 mg/dL (ref 1.7–2.4)

## 2019-11-13 LAB — PHOSPHORUS: Phosphorus: 5.4 mg/dL — ABNORMAL HIGH (ref 2.5–4.6)

## 2019-11-13 NOTE — PMR Pre-admission (Signed)
PMR Admission Coordinator Pre-Admission Assessment  Patient: [name] is an 44 y.o., male MRN: [id number] DOB: 01-Feb-1976 Height: 5' 8"  (172.7 cm) Weight: 119.3 kg              Insurance Information HMO:     PPO: yes     PCP:      IPA:      80/20:      OTHER:  PRIMARY: [employer]      Policy#: [id number]      Subscriber: pt CM Name: Butch Penny      Phone#: [phone] ext [phone]     Fax#: Epic access Pre-Cert#: [id number] approved until 4/14 when updates are due. F/u with Oscar La phone [phone] fax [phone]      Employer: [employer] Benefits:  Phone #: [phone]     Name: 11/12/19 Eff. Date: 01/07/2008     Deduct: $1750      Out of Pocket Max: $4500      Life Max: none CIR: 80% after deductible      SNF: 80% days limited by medical neccesity review Outpatient: $60 per visit     Co-Pay: limited to 20 visits each Home Health: 80%      Co-Pay: 60 visits per year DME: 80%     Co-Pay: 20% Providers: in network  SECONDARY: none      Medicaid Application Date:       Case Manager:  Disability Application Date:       Case Worker:   The "Data Collection Information Summary" for patients in Inpatient Rehabilitation Facilities with attached "Privacy Act Leon Records" was provided and verbally reviewed with: N/A  Emergency Contact Information Contact Information    Name Relation Home Work Mobile   [place] Spouse [phone]  [place] Mother [phone]  [phone]     Current Medical History  Patient Admitting Diagnosis: 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   Initially presented 07/22/2019 with Covid pneumonia causing acute respiratory failure.  He developed pneumomediastinum 12/23 with concern for possible bilateral small pneumothoraces 08/01/2019.  Patient initially  placed on 10-day course of Decadron as well as REMDESIVIR and TOCILUZIMAB.  Patient required intubation and later underwent bilateral chest tube placements.  TEE 08/03/2019 ejection fraction 70% without emboli.  Long-term intubation required tracheostomy completed 08/06/2019 that has been downsized and de cannulated 11/08/2019.  As well as PEG tube placement 09/11/2019 per Dr. Worthy Keeler.  He did require initial long-term broad-spectrum antibiotics as well as ECMO. 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  Palliative care continues to follow to establish goals of care.  His diet has been advanced to mechanical soft thin liquids.  Subcutaneous Lovenox for DVT prophylaxis.  Patient with deep tissue pressure injuries to bilateral heels and lateral feet followed by WOC with dressing changes as directed.   Past Medical History  Past Medical History:  Diagnosis Date  . Asthma   . GERD (gastroesophageal reflux disease)   . Headache   . History of kidney stones    LEFT URETERAL STONE  . HTN (hypertension)   . Pancreatitis 2018   GALLBALDDER SLUDGE CAUSED ISSUED RESOLVED    Family History  family history includes Asthma in his brother; Diabetes in his father and paternal grandmother; GER disease in his mother; Heart disease in his maternal grandfather; Hypertension  in his father and paternal grandmother; Migraines in his mother; Pancreatic cancer in his maternal grandmother.  Prior Rehab/Hospitalizations:  Has the patient had prior rehab or hospitalizations prior to admission? Yes  Has the patient had major surgery during 100 days prior to admission? Yes  Current Medications   Current Facility-Administered Medications:  .  0.9 %  sodium chloride infusion, , Intravenous, PRN, Prescott Gum, Collier Salina, MD, Last Rate: 10 mL/hr at 11/09/19 1258, 250 mL at 11/09/19 1258 .  acetaminophen (TYLENOL) tablet 650 mg, 650 mg, Per Tube,  Q6H PRN, Prescott Gum, Collier Salina, MD, 650 mg at 11/11/19 1619 .  albuterol (PROVENTIL) (2.5 MG/3ML) 0.083% nebulizer solution 2.5 mg, 2.5 mg, Nebulization, Q4H PRN, Wonda Olds, MD, 2.5 mg at 11/04/19 1937 .  amLODipine (NORVASC) tablet 10 mg, 10 mg, Oral, Daily, Atkins, Glenice Bow, MD, 10 mg at 11/13/19 1005 .  ascorbic acid (VITAMIN C) tablet 500 mg, 500 mg, Oral, Daily, Orvan Seen, Glenice Bow, MD, 500 mg at 11/14/19 0936 .  calcium carbonate (TUMS - dosed in mg elemental calcium) chewable tablet 200 mg of elemental calcium, 1 tablet, Oral, TID PRN, Clydell Hakim, MD, 200 mg of elemental calcium at 10/30/19 2100 .  carvedilol (COREG) tablet 25 mg, 25 mg, Oral, BID WC, Atkins, Glenice Bow, MD, 25 mg at 11/14/19 0936 .  chlorhexidine (PERIDEX) 0.12 % solution 15 mL, 15 mL, Mouth Rinse, BID, Marshall, Jessica, DO, 15 mL at 11/13/19 1006 .  Chlorhexidine Gluconate Cloth 2 % PADS 6 each, 6 each, Topical, Daily, Kipp Brood, MD, 6 each at 11/13/19 1400 .  clonazePAM (KLONOPIN) disintegrating tablet 1 mg, 1 mg, Oral, QHS, Atkins, Glenice Bow, MD, 1 mg at 11/13/19 2150 .  [COMPLETED] diazepam (VALIUM) tablet 10 mg, 10 mg, Oral, Q6H, 10 mg at 09/22/19 1123 **FOLLOWED BY** diazepam (VALIUM) tablet 2.5 mg, 2.5 mg, Per Tube, Q12H PRN, Candee Furbish, MD, 2.5 mg at 10/31/19 2124 .  enoxaparin (LOVENOX) injection 60 mg, 60 mg, Subcutaneous, Q24H, Atkins, Glenice Bow, MD, 60 mg at 11/13/19 1007 .  escitalopram (LEXAPRO) tablet 20 mg, 20 mg, Oral, Daily, Orvan Seen, Glenice Bow, MD, 20 mg at 11/14/19 0934 .  feeding supplement (ENSURE ENLIVE) (ENSURE ENLIVE) liquid 237 mL, 237 mL, Oral, TID BM, Atkins, Glenice Bow, MD, 237 mL at 11/13/19 2153 .  feeding supplement (OSMOLITE 1.5 CAL) liquid 900 mL, 900 mL, Per Tube, Q24H, Atkins, Glenice Bow, MD, Stopped at 11/14/19 0900 .  feeding supplement (PRO-STAT SUGAR FREE 64) liquid 60 mL, 60 mL, Per Tube, QID, Atkins, Glenice Bow, MD, 60 mL at 11/13/19 2150 .  Gerhardt's butt cream, , Topical,  Daily, Kipp Brood, MD, Given at 11/14/19 0940 .  hydrALAZINE (APRESOLINE) injection 20 mg, 20 mg, Intravenous, Q4H PRN, Kipp Brood, MD, 20 mg at 11/02/19 0307 .  HYDROmorphone (DILAUDID) tablet 2 mg, 2 mg, Per Tube, Q6H PRN, Wonda Olds, MD, 2 mg at 11/12/19 2248 .  insulin aspart (novoLOG) injection 0-15 Units, 0-15 Units, Subcutaneous, Q6H, Omar Person, NP, 2 Units at 11/14/19 0031 .  ipratropium-albuterol (DUONEB) 0.5-2.5 (3) MG/3ML nebulizer solution 3 mL, 3 mL, Nebulization, Q4H PRN, Kipp Brood, MD, 3 mL at 11/05/19 1917 .  loperamide HCl (IMODIUM) 1 MG/7.5ML suspension 2 mg, 2 mg, Per Tube, PRN, Bensimhon, Shaune Pascal, MD, 2 mg at 11/03/19 0422 .  loratadine (CLARITIN) tablet 10 mg, 10 mg, Oral, Daily, Margaretha Seeds, MD, 10 mg at 11/13/19 1005 .  MEDLINE mouth rinse, 15 mL, Mouth Rinse,  q12n4p, Marshall, Jessica, DO, 15 mL at 11/13/19 1615 .  melatonin tablet 6 mg, 6 mg, Oral, QHS, Atkins, Glenice Bow, MD, 6 mg at 11/13/19 2150 .  montelukast (SINGULAIR) tablet 10 mg, 10 mg, Oral, QHS, Atkins, Glenice Bow, MD, 10 mg at 11/13/19 2150 .  nutrition supplement (JUVEN) (JUVEN) powder packet 1 packet, 1 packet, Per Tube, BID BM, Orvan Seen, Glenice Bow, MD, 1 packet at 11/13/19 1400 .  ondansetron (ZOFRAN) injection 4 mg, 4 mg, Intravenous, Q6H PRN, Prescott Gum, Collier Salina, MD, 4 mg at 10/31/19 1024 .  pantoprazole (PROTONIX) EC tablet 40 mg, 40 mg, Oral, Daily, Atkins, Glenice Bow, MD, 40 mg at 11/13/19 1006 .  polycarbophil (FIBERCON) tablet 625 mg, 625 mg, Oral, Daily, Clydell Hakim, MD, 625 mg at 11/13/19 1005 .  psyllium (HYDROCIL/METAMUCIL) packet 1 packet, 1 packet, Oral, TID, Atkins, Glenice Bow, MD, 1 packet at 11/13/19 1007 .  sodium chloride (OCEAN) 0.65 % nasal spray 1 spray, 1 spray, Each Nare, PRN, Prescott Gum, Peter, MD .  sodium chloride flush (NS) 0.9 % injection 10-40 mL, 10-40 mL, Intracatheter, Q12H, Orvan Seen, Glenice Bow, MD, 10 mL at 11/14/19 0941 .  sodium chloride flush (NS)  0.9 % injection 10-40 mL, 10-40 mL, Intracatheter, PRN, Atkins, Glenice Bow, MD .  spironolactone (ALDACTONE) tablet 25 mg, 25 mg, Oral, Daily, Bensimhon, Shaune Pascal, MD, 25 mg at 11/13/19 1006 .  tamsulosin (FLOMAX) capsule 0.4 mg, 0.4 mg, Oral, QPC supper, Clegg, Amy D, NP, 0.4 mg at 11/13/19 1816 .  traZODone (DESYREL) tablet 100 mg, 100 mg, Oral, QHS, Atkins, Glenice Bow, MD, 100 mg at 11/13/19 2149  Patients Current Diet:  Diet Order            Diet regular Room service appropriate? Yes with Assist; Fluid consistency: Thin  Diet effective now              Precautions / Restrictions Precautions Precautions: Fall, Other (comment) Precaution Comments: PEG, bilat foot wounds, B shoulder impingement Other Brace: B prevalon boots; R wrist cock-up splint Restrictions Weight Bearing Restrictions: No RLE Weight Bearing: Non weight bearing LLE Weight Bearing: Non weight bearing Other Position/Activity Restrictions: due to tissue injury   Has the patient had 2 or more falls or a fall with injury in the past year?No  Prior Activity Level Community (5-7x/wk): independent and working pta  Prior Functional Level Prior Function Level of Independence: Independent Comments: Pt independent in ADLs, IADLs, and mobility. Pt drives and works at [employer]. Pt does not use oxygen at home.  Self Care: Did the patient need help bathing, dressing, using the toilet or eating?  Independent  Indoor Mobility: Did the patient need assistance with walking from room to room (with or without device)? Independent  Stairs: Did the patient need assistance with internal or external stairs (with or without device)? Independent  Functional Cognition: Did the patient need help planning regular tasks such as shopping or remembering to take medications? Independent  Home Assistive Devices / Equipment Home Assistive Devices/Equipment: None Home Equipment: None  Prior Device Use: Indicate devices/aids  used by the patient prior to current illness, exacerbation or injury? None of the above  Current Functional Level Cognition  Overall Cognitive Status: Within Functional Limits for tasks assessed Difficult to assess due to: Tracheostomy Current Attention Level: Selective Orientation Level: Oriented X4 Following Commands: Follows one step commands consistently Safety/Judgement: Decreased awareness of deficits General Comments: slow processing at times; demonstrates increased awareness - voicing concerns about going  to rehab due to changing floors adn nto knowing staff. Listened to conerns and supported pt in knowing that his feelings were normal and to be expected after being so sick and being on the same floor since December. .     Extremity Assessment (includes Sensation/Coordination)  Upper Extremity Assessment: RUE deficits/detail, LUE deficits/detail RUE Deficits / Details: When pt placed in gravity eliminated position, he is able to extend wrist to neutral x 4 before fatiging, and able to activate elbow flexion x 6 before fatiguing (1/5).   finger flexion ~50% composite flexion actively, shoulder 2-/5; tricep at least 3+/5  RUE Sensation: (unable to assess) RUE Coordination: decreased fine motor, decreased gross motor LUE Deficits / Details: Pt with 1+/5 - 2-/5 elbow flexion, tricpe at least 3+/5; shoulder 2-/5; wrist 2+/5 ext, 2/5 flexion; and able to achieve ~80% composite flexion  LUE Sensation: (unable to assess) LUE Coordination: decreased fine motor, decreased gross motor  Lower Extremity Assessment: Defer to PT evaluation RLE Deficits / Details: ankle DF limited to -5 degrees bilaterally, knee flexion limited to ~90 degrees, hip flexion limited to ~50 degrees due to pain. Knee extension, abduction and adduction WFL bilaterally LLE Deficits / Details: ankle DF limited to -5 degrees bilaterally, knee flexion limited to ~90 degrees, hip flexion limited to ~50 degrees due to pain. Knee  extension, abduction and adduction WFL bilaterally    ADLs  Overall ADL's : Needs assistance/impaired Eating/Feeding: Maximal assistance Eating/Feeding Details (indicate cue type and reason): using tubing and adapted cup with L elbow propped on pillows and towel on chest to allow gravity to help withelbow flexion Grooming: Maximal assistance, Wash/dry face Grooming Details (indicate cue type and reason): assisting with holding cloth to nose adn blowing nose; using grip to help hold cloth adn pinch nostral; wiping mouth with cloth Upper Body Bathing: Supervision/ safety, Set up, Sitting Lower Body Bathing: Supervison/ safety, Set up, Sit to/from stand, Sitting/lateral leans Upper Body Dressing : Supervision/safety, Set up, Sitting Lower Body Dressing: Supervision/safety, Set up, Sitting/lateral leans, Sit to/from stand(use of figure four) Toilet Transfer: Min guard, BSC Toileting- Clothing Manipulation and Hygiene: Total assistance Toileting - Clothing Manipulation Details (indicate cue type and reason): pt asked for bed pan. Max A to roll with pt holding onto rail with UE Functional mobility during ADLs: +2 for physical assistance, Maximal assistance(with use of Denna Haggard) General ADL Comments: focused session on truncal and BUE exercise in chair position to prepare for BADL engagement    Mobility  Overal bed mobility: Needs Assistance Bed Mobility: Rolling Rolling: Max assist, +2 for physical assistance Sidelying to sit: +2 for physical assistance, Max assist Supine to sit: HOB elevated, Mod assist, +2 for safety/equipment Sit to supine: Max assist, +2 for physical assistance General bed mobility comments: pt in seated position using lower bed rails to pull forward out of reclined position with Mod A. Pt working on holding once in upright position    Transfers  Overall transfer level: Needs assistance Equipment used: 2 person hand held assist Transfer via [place]:  [employer] Transfers: Sit to/from [employer] to Stand: Max assist, +2 physical assistance Stand pivot transfers: Min guard General transfer comment: bed to chair via maxisky lift; once in recliner donned shoes and patient assisted to scoot to edge of chair, sit to squat x 5 with +2 A lifting with pad under hips, using momentum and blocking knees    Ambulation / Gait / Stairs / Wheelchair Mobility  Ambulation/Gait General Gait Details: did  not attempt ambulation yet    Posture / Balance Dynamic Sitting Balance Sitting balance - Comments: min A for safety with balance at edge of chair Balance Overall balance assessment: Needs assistance Sitting-balance support: Feet supported Sitting balance-Leahy Scale: Poor Sitting balance - Comments: min A for safety with balance at edge of chair Postural control: (BUE supported on lift; able to bring self upright) Standing balance support: Bilateral upper extremity supported, During functional activity Standing balance-Leahy Scale: Zero Standing balance comment: Clarise Cruz plus in addition to +2 max assist to partially stand.     Special needs/care consideration BiPAP/CPAP n/a CPM n/a Continuous Drip IV midline single lumen left basilic Dialysis   N/a Life Vest n/a Oxygen O2 at 3 liters nasal cannula Special Bed KREG tilt bed Trach Size de cannulated 11/08/2019 Wound Vac n/a Skin   Placed on 3/8 PEG LUQ with nocturnal feeds, on D3 diet with thin liquids followed by SLP Meryle Ready, RN  Registered Nurse  WOC  Consult Note     Signed  Date of Service:  11/12/2019  8:20 AM          Signed        Show:Clear all [x] Manual[x] Template[] Copied  Added by: [x] Meryle Ready, RN  [] Hover for details [place] Nurse wound follow up Patient receiving care in [place]. Wound type: Resolving DTPI to bilateral heels and lateral feet Measurement: Right foot eschar measures 17 cm x 6 cm.  Left foot eschar measures 20 cm x 6 cm Wound bed: 100% dry, hard, black,  eschar Drainage (amount, consistency, odor) none Periwound: intact Dressing procedure/placement/frequency: Continue iodine/betadine to eschar. Conservative sharp wound debridement (CSWD performed at the bedside): of easily lifted, peeling black eschar. Monitor the wound area(s) for worsening of condition such as: Signs/symptoms of infection,  Increase in size,  Development of or worsening of odor, Development of pain, or increased pain at the affected locations.  Notify the medical team if any of these develop. Val Riles, RN, MSN, CWOCN, CNS-BC, pager [phone]              Bowel mgmt: continent Bladder mgmt: external catheter Diabetic mgmt n/a Behavioral consideration  Some problems with sleep and anxiety Chemo/radiation n/a Designated visitors are wife, [name] and mother, Helene Kelp   Previous Home Environment  Living Arrangements: Spouse/significant other(and 32 year old son)  Lives With: Spouse, Son Available Help at Discharge: (wife works 4 pm until 55 am; 54 year old son at home) Type of Home: [place]: One level Home Access: Stairs to enter Entrance Stairs-Rails: Right, Left Entrance Stairs-Number of Steps: 4 Bathroom Shower/Tub: Multimedia programmer: Standard Bathroom Accessibility: Yes How Accessible: Accessible via walker [place]: No Additional Comments: history obtained from chart as pt paralyzed and sedation being weaned  Discharge Living Setting Plans for Discharge Living Setting: Patient's home, Lives with (comment)(spouse and 3 year old son) Type of Home at Discharge: House Discharge Home Layout: One level Discharge Home Access: Stairs to enter Entrance Stairs-Rails: Right, Left Entrance Stairs-Number of Steps: 4 Discharge Bathroom Shower/Tub: Walk-in shower Discharge Bathroom Toilet: Standard Discharge Bathroom Accessibility: Yes How Accessible: Accessible via walker Does the patient have any problems obtaining your  medications?: No  Social/Family/Support Systems Patient Roles: Spouse, Parent(employee) Contact Information: wife, Oceanographer Anticipated Caregiver: wife, mother, son Anticipated Ambulance person Information: see above Ability/Limitations of Caregiver: wife works 4 pm until 12 am Caregiver Availability: (wife and patient have not made 24/7 arrangements ) Discharge Plan Discussed with Primary  Caregiver: Yes Is Caregiver In Agreement with Plan?: Yes Does Caregiver/Family have Issues with Lodging/Transportation while Pt is in Rehab?: No   Patietnt;s Mom is 4 years old and avialalbe, but patient and his wife have not yet spoken to her about her ability to asisst when wife works when he first discharges home.  Goals/Additional Needs Patient/Family Goal for Rehab: supervision to min assist with PT and OT, supervision with SLP Expected length of stay: ELOS 21 to 28 days Equipment Needs: KREG tilt bed; lift to asisst up to chair Special Service Needs: Patient with anxiety Pt/Family Agrees to Admission and willing to participate: Yes Program Orientation Provided & Reviewed with Pt/Caregiver Including Roles  & Responsibilities: Yes  Barriers to Discharge: Decreased caregiver support, Insurance for SNF coverage  Barriers to Discharge Comments: wife has asked about SNF after CIR; [employer] may not approve both CIR adn SNF and patient and wife are aware  Decrease burden of Care through IP rehab admission: Decrease number of caregivers  Possible need for SNF placement upon discharge: patient may need SNF after CIR but I have explained to patient and wife that [employer] may consider that custodial and may not cover SNF after CIR  Patient Condition: This patient's medical and functional status has changed since the consult dated 10/21/2019 in which the Rehabilitation Physician documented that the patient was not appropriate for intensive rehabilitative care in an inpatient rehabilitation facility. Due to change  in status and issues being addressed, patient's case has been discussed with Dr. Naaman Plummer and patient now appropriate for inpatient rehabilitation. Will admit to inpatient rehab today.  Preadmission Screen Completed By:  Cleatrice Burke, RN, 11/14/2019 11:01 AM ______________________________________________________________________   Discussed status with Dr. Dagoberto Ligas on 11/14/2019 at 64 and received approval for admission today.  Admission Coordinator:  Cleatrice Burke, time 1100 Date 11/14/2019

## 2019-11-13 NOTE — Progress Notes (Signed)
  Speech Language Pathology Treatment: Dysphagia  Patient Details Name: [name] MRN: [id number] DOB: February 11, 1976 Today's Date: 11/13/2019 Time: 2876-8115 SLP Time Calculation (min) (ACUTE ONLY): 12 min  Assessment / Plan / Recommendation Clinical Impression  Pt was seen for dysphagia treatment and was cooperative throughout the session. Pt, and nursing reported that the pt has been tolerating the current dysphagia 3 diet without overt s/sx of aspiration. Pt tolerated regular texture solids and thin liquids via straw using consecutive swallows without symptoms of aspiration. Mastication time was within functional limits and no significant oral residue was noted. Pt did express some fear regarding swallowing and diet advancement. However, following education regarding his progress and performance, he expressed that he would be willing to try it. It is recommended that the pt's diet be upgraded to regular consistency with thin liquids at this time. SLP will continue to follow pt.    HPI HPI: Pt is a 44 y/o male admitted 12/14 with COVID pneumonia leading to ARDS and needing mechanical ventilation, pneumomediastinum. He was intubated 12/26-12/29 with trach placement on latter date. PEG tube done on 09/11/19 and replaced on 10/14/19 after dislodging. Decannulated from VV ECMO on 3/4 after 65 days. SLP was consulted for possible PMSV placement. Pt downsized to #6 distal cuffless on 10/22/19. Capping trials 3/23. FEES 3/19 with pharyngoesophageal dysphagia noted and backflow of POs from esophagus aspirated; recs were to remain NPO. MBS 3/26.21: mild pharyngeal phase dysphagia characterized by reduced lingual retraction and reduced anterior laryngeal movement. Dysphagia 3 with thin recommended. Trach decannulated on 4/2      SLP Plan  Goals updated       Recommendations  Diet recommendations: Regular;Thin liquid Liquids provided via: Cup;Straw Medication Administration: Whole meds with  liquid Supervision: Trained caregiver to feed patient;Staff to assist with self feeding Compensations: Slow rate;Other (Comment)(small single sips; small meals ) Postural Changes and/or Swallow Maneuvers: Seated upright 90 degrees;Upright 30-60 min after meal                Oral Care Recommendations: Oral care BID Follow up Recommendations: Inpatient Rehab SLP Visit Diagnosis: Dysphagia, pharyngeal phase (R13.13) Plan: Goals updated       Travez Stancil I. Hardin Negus, [place], [place] Office number [phone] Pager [place] 11/13/2019, 10:19 AM

## 2019-11-13 NOTE — Progress Notes (Signed)
Inpatient Rehabilitation Admissions Coordinator  I have received approval from [facility] for admit to CIR. I have notified patient, his wife and acute team of plans to admit Thursday pending bed availability. All are in agreement.  Danne [name], RN, MSN Rehab Admissions Coordinator [phone] 11/13/2019 5:23 PM

## 2019-11-13 NOTE — Progress Notes (Addendum)
Patient ID: [name], male   DOB: 08/09/1975, 44 y.o.   MRN: [id number]    Advanced Heart Failure Rounding Note   Subjective:    No complaints this morning. Eating breakfast. Making progress w/ PT. Planning transition to CIR soon.   VSS.   Objective:   Weight Range:  Vital Signs:   Temp:  [97.6 F (36.4 C)-98.6 F (37 C)] 98.6 F (37 C) (04/07 0800) Resp:  [9-21] 14 (04/07 0800) BP: (99-146)/(47-99) 131/75 (04/07 0800) SpO2:  [80 %-100 %] 100 % (04/07 0800) Weight:  [120.6 kg] 120.6 kg (04/07 0500) Last BM Date: 11/12/19  Weight change: Filed Weights   11/11/19 0600 11/12/19 0500 11/13/19 0500  Weight: 119.8 kg 120 kg 120.6 kg    Intake/Output:   Intake/Output Summary (Last 24 hours) at 11/13/2019 0833 Last data filed at 11/13/2019 0200 Gross per 24 hour  Intake 1070 ml  Output 2480 ml  Net -1410 ml     PHYSICAL EXAM: General:  Well appearing obese WM. No respiratory difficulty HEENT: normal anicteric  Neck: supple. no JVD. Carotids 2+ bilat; no bruits. No lymphadenopathy or thyromegaly appreciated. Trach and cannualtion site ok  Cor: PMI nondisplaced. Regular rate & rhythm. No rubs, gallops or murmurs. Lungs: clear no wheeze  Abdomen: obese but soft, nontender, nondistended. No hepatosplenomegaly. No bruits or masses. Good bowel sounds. +PEG Extremities: no cyanosis, clubbing, rash, no edema  Neuro: alert & oriented x 3, cranial nerves grossly intact. Remains weak in all 4 extremities Affect pleasant.   Telemetry: NSR 80s Personally reviewed   Labs: Basic Metabolic Panel: Recent Labs  Lab 11/07/19 0430 11/07/19 0430 11/08/19 0311 11/08/19 0311 11/09/19 0340 11/10/19 0346 11/11/19 0458  NA 139  --  137  --  139 138 138  K 4.3  --  4.4  --  4.2 4.2 4.5  CL 96*  --  96*  --  96* 95* 96*  CO2 30  --  32  --  31 31 31   GLUCOSE 105*  --  112*  --  148* 127* 124*  BUN 42*  --  53*  --  50* 51* 46*  CREATININE 0.71  --  0.78  --  0.75 0.74 0.77   CALCIUM 9.5   < > 9.6   < > 9.5 9.3 9.4  MG 1.7  --  1.9  --  1.8 1.9 1.7  PHOS 5.6*  --  4.6  --  4.7* 4.6 5.3*   < > = values in this interval not displayed.    Liver Function Tests: No results for input(s): AST, ALT, ALKPHOS, BILITOT, PROT, ALBUMIN in the last 168 hours. No results for input(s): LIPASE, AMYLASE in the last 168 hours. No results for input(s): AMMONIA in the last 168 hours.  CBC: Recent Labs  Lab 11/07/19 0430 11/08/19 0311 11/09/19 0340 11/10/19 0346 11/11/19 0458  WBC 7.0 6.1 5.6 5.4 6.2  HGB 11.7* 11.9* 11.2* 11.5* 11.6*  HCT 38.6* 38.6* 36.8* 37.7* 37.5*  MCV 90.2 90.6 90.0 89.3 89.5  PLT 114* 120* 118* 135* 155    Cardiac Enzymes: No results for input(s): CKTOTAL, CKMB, CKMBINDEX, TROPONINI in the last 168 hours.  BNP: BNP (last 3 results) Recent Labs    07/22/19 1039  BNP 15.3    ProBNP (last 3 results) No results for input(s): PROBNP in the last 8760 hours.    Other results:  Imaging: No results found.   Medications:     Scheduled  Medications: . amLODipine  10 mg Oral Daily  . vitamin C  500 mg Oral Daily  . carvedilol  25 mg Oral BID WC  . chlorhexidine  15 mL Mouth Rinse BID  . Chlorhexidine Gluconate Cloth  6 each Topical Daily  . clonazepam  1 mg Oral QHS  . enoxaparin (LOVENOX) injection  60 mg Subcutaneous Q24H  . escitalopram  20 mg Oral Daily  . feeding supplement (ENSURE ENLIVE)  237 mL Oral TID BM  . feeding supplement (OSMOLITE 1.5 CAL)  900 mL Per Tube Q24H  . feeding supplement (PRO-STAT SUGAR FREE 64)  60 mL Per Tube QID  . Gerhardt's butt cream   Topical Daily  . insulin aspart  0-15 Units Subcutaneous Q6H  . loratadine  10 mg Oral Daily  . mouth rinse  15 mL Mouth Rinse q12n4p  . melatonin  6 mg Oral QHS  . montelukast  10 mg Oral QHS  . nutrition supplement (JUVEN)  1 packet Per Tube BID BM  . pantoprazole  40 mg Oral Daily  . polycarbophil  625 mg Oral Daily  . psyllium  1 packet Oral TID  . sodium  chloride flush  10-40 mL Intracatheter Q12H  . spironolactone  25 mg Oral Daily  . tamsulosin  0.4 mg Oral QPC supper  . traZODone  100 mg Oral QHS    Infusions: . sodium chloride 250 mL (11/09/19 1258)    PRN Medications: sodium chloride, acetaminophen, albuterol, calcium carbonate, [COMPLETED] diazepam **FOLLOWED BY** diazepam, hydrALAZINE, HYDROmorphone, ipratropium-albuterol, loperamide HCl, ondansetron (ZOFRAN) IV, sodium chloride, sodium chloride flush   Assessment/Plan:    1. Acute hypoxic/hypercarbic respiratory failure due to COVID PNA: Remained markedly hypoxic despite full support. Intubated 12/26. S/p bilateral CTs 12/26 for pneumothoraces.  TEE on 12/26 LVEF 70% RV ok. VV ECMO begun 12/26. Tracheostomy 12/29.  COVID ARDS at this point. ECMO circuit changed 1/8 & 1/23. Crescent cannula placed 2/2 (femoral cannuals removed) - ECMO decannulated 3/4 - Trach decannulated 11/08/19 - O2 sats stable today. Wound care provided to previous trach site  2. COVID PNA: management of COVID infection per CCM. CXR with bilateral multifocal PNA progressing to likely ARDS.  - s/p 10 days of remdesivir - 12/16, 12/2 s/p tociluzimab - 12/17 s/p convalescent plasma - Decadron completed 1/11 - Resolved  3. FEN - Now on D3 diet. - Getting nocturnal tube feeds via PEG - Continue PPI.  - no change   4. Hypernatremia -Resolved.   5. DTIs - On both feet.  Improving  6.  Hypokalemia. - 4.5 last blood draw - repeat BMP today    7.  Diffuse muscle weakness. -This is consistent with ICU/steroid/paralytics myopathy.  Continue passive range of motion exercises with PT.   -PT and OT are following.  Both have recommended CIR.   - Remains very weak PT/OT working with patient every other day. ICU nurses also doing exercises with him 2x/day -Requires nurses to feed him every meal as his arms are too weak.  Also requiring frequent repositioning due to discomfort and avoidance of pressure  ulcers. - CIR following closely and thinks he may be ready to move to CIR later this week. Insurance prior authorization pending   8. HTN - Stable and controlled   10. Urinary retention - Improved. Continue Flomax   Length of Stay: [place] PA-C 11/13/2019, 8:33 AM  Advanced Heart Failure Team Pager [phone] (M-F; 7a - 4p)  Please contact [facility] for night-coverage  after hours (4p -7a ) and weekends on [email or web address]   Patient seen and examined with the above-signed Advanced Practice Provider and/or Housestaff. I personally reviewed laboratory data, imaging studies and relevant notes. I independently examined the patient and formulated the important aspects of the plan. I have edited the note to reflect any of my changes or salient points. I have personally discussed the plan with the patient and/or family.  Continues to work with PT/OT and nursing staff. Improving slowly. BP and labs stable.   Has been approved for CIR.   Glori Bickers, MD  7:07 PM

## 2019-11-13 NOTE — Progress Notes (Addendum)
Occupational Therapy Treatment Patient Details Name: [name] MRN: [id number] DOB: 11/19/1975 Today's Date: 11/13/2019    History of present illness 44 y/o M admitted 12/14 with COVID pneumonia causing acute hypoxemic respiratory failure.   Developed pneumomediastinum 12/23 with concern for possible bilateral small pneumothoraces on 12/24. Pt intubated on 12/26 and started on ECMO on 12/27. Tracheostomy on 12/29. Pt on ECMO until 3/4. Pt on vent until 3/14 and trach removed 4/2.  PMH - asthma, htn, obesity, rt shoulder I&D.    OT comments  Pt continues to make gains daily. Pt able to assist with pulling into R lateral lean when L hand placed on rail to assist with self care. Continues to complain of B shoulder pain consistent with impingement. Posterior shoulder girdle slowly improving in strength. Significant increase in strength in B elbow extension - R 3/5; L 3+/5  Elbow flexion @1 +/5 R and @ 2+/5 L - using gravity eliminated positions to facilitate functional use of arms. B grip strength contineus to imporve - L stronger and more functional than R. Trace L wrist extension. Continue to recommend pt wear R wrist cock-up splint for 2 hours on/2 hours off during the day and during the night as tolerated.   Nsg educated on proper positioning of BUE to increase functional use of incentive spirometer and lidded cup. Pt extremely motivated to improve. Will continue ot follow acutely.   Follow Up Recommendations  CIR;Supervision/Assistance - 24 hour    Equipment Recommendations  Other (comment)(TBA)    Recommendations for Other Services Rehab consult    Precautions / Restrictions Precautions Precautions: Fall;Other (comment) Precaution Comments: PEG, bilat foot wounds, B shoulder impingement Required Braces or Orthoses: Other Brace Other Brace: B prevalon boots; R wrist cock-up splint Restrictions RLE Weight Bearing: Non weight bearing LLE Weight Bearing: Non weight bearing        Mobility Bed Mobility     Rolling: Max assist;+2 for physical assistance         General bed mobility comments: pt in seated position using lower bed rails to pull forward out of reclined position with Mod A. Pt working on holding once in upright position  Transfers                      Balance                                           ADL either performed or assessed with clinical judgement   ADL   Eating/Feeding: Maximal assistance Eating/Feeding Details (indicate cue type and reason): using tubing and adapted cup with L elbow propped on pillows and towel on chest to allow gravity to help withelbow flexion Grooming: Maximal assistance;Wash/dry Forensic scientist and Hygiene: Total assistance Toileting - Clothing Manipulation Details (indicate cue type and reason): pt asked for bed pan. Max A to roll with pt holding onto rail with UE             Vision       Perception     Praxis      Cognition Arousal/Alertness: Awake/alert Behavior During Therapy: WFL for tasks assessed/performed Overall Cognitive Status: Within Functional Limits for tasks assessed  General Comments: slow processing at times; demonstrates increased awareness - voicing concerns about going to rehab due to changing floors adn nto knowing staff. Listened to conerns and supported pt in knowing that his feelings were normal and to be expected after being so sick and being on the same floor since December. .         Exercises General Exercises - Upper Extremity Shoulder Flexion: AROM;AAROM;20 reps;Supine Theraband Level (Shoulder Flexion): Level 1 (Yellow) Shoulder Extension: AROM;AAROM;Both;20 reps;Seated Shoulder ABduction: AROM;AAROM;Both;20 reps;Seated Shoulder ADduction: AROM;AAROM;Strengthening;Both;15 reps;Supine Shoulder Horizontal ABduction: AAROM;AROM;20  reps;Supine Shoulder Horizontal ADduction: AROM;AAROM;Both;15 reps;Supine Elbow Flexion: PROM;AROM;AAROM;15 reps;Seated;Supine Elbow Extension: AROM;Both;20 reps;Theraband Theraband Level (Elbow Extension): Level 1 (Yellow) Wrist Flexion: Both;20 reps;Seated;AROM;Strengthening Wrist Extension: Right;AAROM;PROM;10 reps;Seated Digit Composite Flexion: Both;20 reps;Seated;Squeeze ball;Strengthening;AROM Composite Extension: AAROM;AROM;Both;15 reps;Seated General Exercises - Lower Extremity Short Arc Quad: AAROM;Both;Seated;10 reps Other Exercises Other Exercises: lateral leans, pushing through UE to upright sitting x 15 each side Other Exercises: Semi reclined - trunk facilitation by reaching across midline x 15 each side Other Exercises: theraband tied to bed rails - pt pushing ebow extension and able to hold shoulder in flexion wtih increased control   Shoulder Instructions       General Comments      Pertinent Vitals/ Pain       Pain Assessment: Faces Faces Pain Scale: Hurts even more Pain Location: Buttocks Pain Descriptors / Indicators: Grimacing;Guarding Pain Intervention(s): Limited activity within patient's tolerance;Repositioned  Home Living   Living Arrangements: Spouse/significant other(and 69 year old son) Available Help at Discharge: (wife works 4 pm until 2 am; 76 year old son at home)                     Bathroom Accessibility: Yes How Accessible: Accessible via walker        Lives With: Spouse;Son    Prior Functioning/Environment              Frequency  Min 3X/week        Progress Toward Goals  OT Goals(current goals can now be found in the care plan section)  Progress towards OT goals: Progressing toward goals  Acute Rehab OT Goals Patient Stated Goal: regain strength and independence OT Goal Formulation: With patient Time For Goal Achievement: 11/20/19 Potential to Achieve Goals: Good ADL Goals Pt Will Perform Eating: with mod  assist;with adaptive utensils;sitting Pt Will Perform Grooming: with max assist;sitting;with adaptive equipment Pt Will Perform Upper Body Bathing: with max assist;sitting Pt/caregiver will Perform Home Exercise Program: Increased strength;Increased ROM;Both right and left upper extremity;With Supervision;With written HEP provided Additional ADL Goal #2: Pt will complete sit - stand activity with Max A +2 x 1 min with use of lift equipment  to increase strength for functional mobility Additional ADL Goal #3: Pt will be able to move Lt UE to mouth with mod A Additional ADL Goal #4: Pt will demonstrate B active elbow flexion in gravity eliminated position to increase funcitonal use of arms  Plan Discharge plan remains appropriate    Co-evaluation                 AM-PAC OT "6 Clicks" Daily Activity     Outcome Measure   Help from another person eating meals?: A Lot Help from another person taking care of personal grooming?: A Lot Help from another person toileting, which includes using toliet, bedpan, or urinal?: Total Help from another person bathing (including washing, rinsing, drying)?: Total Help from another person to put  on and taking off regular upper body clothing?: Total Help from another person to put on and taking off regular lower body clothing?: Total 6 Click Score: 8    End of Session Equipment Utilized During Treatment: Oxygen  OT Visit Diagnosis: Muscle weakness (generalized) (M62.81);Other abnormalities of gait and mobility (R26.89);Other symptoms and signs involving cognitive function;Pain Pain - part of body: Shoulder(B; buttocks)   Activity Tolerance Patient tolerated treatment well   Patient Left in bed;with call bell/phone within reach;with nursing/sitter in room   Nurse Communication Mobility status;Need for lift equipment        Time: 8182-9937 OT Time Calculation (min): 60 min  Charges: OT General Charges $OT Visit: 1 Visit OT Treatments $Self  Care/Home Management : 8-22 mins $Neuromuscular Re-education: 23-37 mins $Therapeutic Exercise: 8-22 mins  Maurie Boettcher, OT/L   Acute OT Clinical Specialist [place] Pager [phone] Office [phone]    [facility] 11/13/2019, 6:09 PM

## 2019-11-14 ENCOUNTER — Encounter (HOSPITAL_COMMUNITY): Payer: Self-pay | Admitting: Physical Medicine and Rehabilitation

## 2019-11-14 ENCOUNTER — Inpatient Hospital Stay (HOSPITAL_COMMUNITY)
Admission: RE | Admit: 2019-11-14 | Discharge: 2019-12-04 | DRG: 092 | Disposition: A | Discharge status: Skilled Nursing Facility | Payer: Managed Care, Other (non HMO) | Source: Intra-hospital | Attending: Physical Medicine and Rehabilitation | Admitting: Physical Medicine and Rehabilitation

## 2019-11-14 ENCOUNTER — Other Ambulatory Visit: Payer: Self-pay

## 2019-11-14 DIAGNOSIS — K219 Gastro-esophageal reflux disease without esophagitis: Secondary | ICD-10-CM | POA: Diagnosis present

## 2019-11-14 DIAGNOSIS — H6121 Impacted cerumen, right ear: Secondary | ICD-10-CM | POA: Diagnosis present

## 2019-11-14 DIAGNOSIS — U071 COVID-19: Secondary | ICD-10-CM

## 2019-11-14 DIAGNOSIS — E440 Moderate protein-calorie malnutrition: Secondary | ICD-10-CM | POA: Diagnosis not present

## 2019-11-14 DIAGNOSIS — I959 Hypotension, unspecified: Secondary | ICD-10-CM | POA: Diagnosis not present

## 2019-11-14 DIAGNOSIS — G7281 Critical illness myopathy: Secondary | ICD-10-CM | POA: Diagnosis present

## 2019-11-14 DIAGNOSIS — Z931 Gastrostomy status: Secondary | ICD-10-CM | POA: Diagnosis not present

## 2019-11-14 DIAGNOSIS — N400 Enlarged prostate without lower urinary tract symptoms: Secondary | ICD-10-CM | POA: Diagnosis present

## 2019-11-14 DIAGNOSIS — J45909 Unspecified asthma, uncomplicated: Secondary | ICD-10-CM | POA: Diagnosis present

## 2019-11-14 DIAGNOSIS — Z79899 Other long term (current) drug therapy: Secondary | ICD-10-CM

## 2019-11-14 DIAGNOSIS — R234 Changes in skin texture: Secondary | ICD-10-CM | POA: Diagnosis not present

## 2019-11-14 DIAGNOSIS — L89626 Pressure-induced deep tissue damage of left heel: Secondary | ICD-10-CM | POA: Diagnosis present

## 2019-11-14 DIAGNOSIS — Z8249 Family history of ischemic heart disease and other diseases of the circulatory system: Secondary | ICD-10-CM | POA: Diagnosis not present

## 2019-11-14 DIAGNOSIS — Z8616 Personal history of COVID-19: Secondary | ICD-10-CM

## 2019-11-14 DIAGNOSIS — Z825 Family history of asthma and other chronic lower respiratory diseases: Secondary | ICD-10-CM

## 2019-11-14 DIAGNOSIS — Z833 Family history of diabetes mellitus: Secondary | ICD-10-CM

## 2019-11-14 DIAGNOSIS — Z87442 Personal history of urinary calculi: Secondary | ICD-10-CM

## 2019-11-14 DIAGNOSIS — L89616 Pressure-induced deep tissue damage of right heel: Secondary | ICD-10-CM | POA: Diagnosis present

## 2019-11-14 DIAGNOSIS — J1282 Pneumonia due to coronavirus disease 2019: Secondary | ICD-10-CM

## 2019-11-14 DIAGNOSIS — R1310 Dysphagia, unspecified: Secondary | ICD-10-CM | POA: Diagnosis present

## 2019-11-14 DIAGNOSIS — Z6841 Body Mass Index (BMI) 40.0 and over, adult: Secondary | ICD-10-CM | POA: Diagnosis not present

## 2019-11-14 DIAGNOSIS — I10 Essential (primary) hypertension: Secondary | ICD-10-CM | POA: Diagnosis present

## 2019-11-14 DIAGNOSIS — G4700 Insomnia, unspecified: Secondary | ICD-10-CM | POA: Diagnosis present

## 2019-11-14 HISTORY — DX: Critical illness myopathy: G72.81

## 2019-11-14 LAB — GLUCOSE, CAPILLARY
Glucose-Capillary: 116 mg/dL — ABNORMAL HIGH (ref 70–99)
Glucose-Capillary: 109 mg/dL — ABNORMAL HIGH (ref 70–99)
Glucose-Capillary: 76 mg/dL (ref 70–99)
Glucose-Capillary: 98 mg/dL (ref 70–99)

## 2019-11-14 MED ORDER — CALCIUM POLYCARBOPHIL 625 MG PO TABS
625.0000 mg | ORAL_TABLET | Freq: Every day | ORAL | Status: DC
  Administered 2019-11-15 – 2019-12-04 (×20): 625 mg via ORAL
  Filled 2019-11-14 (×20): qty 1

## 2019-11-14 MED ORDER — TAMSULOSIN HCL 0.4 MG PO CAPS: 0.4000 mg | ORAL_CAPSULE | Freq: Every day | ORAL | Status: DC

## 2019-11-14 MED ORDER — JUVEN PO PACK
1.0000 | PACK | Freq: Two times a day (BID) | ORAL | Status: DC
  Administered 2019-11-15 – 2019-11-26 (×19): 1
  Filled 2019-11-14 (×21): qty 1

## 2019-11-14 MED ORDER — AMLODIPINE BESYLATE 10 MG PO TABS
10.0000 mg | ORAL_TABLET | Freq: Every day | ORAL | Status: DC
  Administered 2019-11-15 – 2019-11-16 (×2): 10 mg via ORAL
  Filled 2019-11-14 (×2): qty 1

## 2019-11-14 MED ORDER — ENSURE ENLIVE PO LIQD
237.0000 mL | Freq: Three times a day (TID) | ORAL | Status: DC
  Administered 2019-11-14 – 2019-11-26 (×31): 237 mL via ORAL

## 2019-11-14 MED ORDER — DIAZEPAM 5 MG PO TABS: 2.5000 mg | ORAL_TABLET | Freq: Two times a day (BID) | ORAL | Status: DC | PRN

## 2019-11-14 MED ORDER — IPRATROPIUM-ALBUTEROL 0.5-2.5 (3) MG/3ML IN SOLN
3.0000 mL | RESPIRATORY_TRACT | Status: DC | PRN
  Filled 2019-11-14: qty 3

## 2019-11-14 MED ORDER — GERHARDT'S BUTT CREAM
TOPICAL_CREAM | Freq: Every day | CUTANEOUS | Status: DC
  Filled 2019-11-14: qty 1

## 2019-11-14 MED ORDER — MONTELUKAST SODIUM 10 MG PO TABS: 10.0000 mg | ORAL_TABLET | Freq: Every day | ORAL | Status: DC

## 2019-11-14 MED ORDER — ENOXAPARIN SODIUM 60 MG/0.6ML ~~LOC~~ SOLN
60.0000 mg | SUBCUTANEOUS | Status: DC
  Administered 2019-11-15 – 2019-12-04 (×20): 60 mg via SUBCUTANEOUS
  Filled 2019-11-14 (×20): qty 0.6

## 2019-11-14 MED ORDER — ACETAMINOPHEN 325 MG PO TABS
650.0000 mg | ORAL_TABLET | Freq: Four times a day (QID) | ORAL | Status: DC | PRN
  Administered 2019-11-15 – 2019-12-02 (×4): 650 mg
  Filled 2019-11-14 (×5): qty 2

## 2019-11-14 MED ORDER — PANTOPRAZOLE SODIUM 40 MG PO TBEC
40.0000 mg | DELAYED_RELEASE_TABLET | Freq: Every day | ORAL | Status: DC
  Administered 2019-11-15 – 2019-12-04 (×20): 40 mg via ORAL
  Filled 2019-11-14 (×21): qty 1

## 2019-11-14 MED ORDER — PANTOPRAZOLE SODIUM 40 MG PO TBEC: 40.0000 mg | DELAYED_RELEASE_TABLET | Freq: Every day | ORAL | Status: DC

## 2019-11-14 MED ORDER — SALINE SPRAY 0.65 % NA SOLN
1.0000 | NASAL | Status: DC | PRN
  Administered 2019-11-19: 09:00:00 1 via NASAL
  Filled 2019-11-14 (×2): qty 44

## 2019-11-14 MED ORDER — ACETAMINOPHEN 325 MG PO TABS: 650.0000 mg | ORAL_TABLET | Freq: Four times a day (QID) | ORAL | Status: DC | PRN

## 2019-11-14 MED ORDER — TRAZODONE HCL 50 MG PO TABS
100.0000 mg | ORAL_TABLET | Freq: Every day | ORAL | Status: DC
  Administered 2019-11-14 – 2019-12-03 (×20): 100 mg via ORAL
  Filled 2019-11-14 (×21): qty 2

## 2019-11-14 MED ORDER — ASCORBIC ACID 500 MG PO TABS: 500.0000 mg | ORAL_TABLET | Freq: Every day | ORAL | Status: DC

## 2019-11-14 MED ORDER — PRO-STAT SUGAR FREE PO LIQD
60.0000 mL | Freq: Four times a day (QID) | ORAL | Status: DC
  Administered 2019-11-14 – 2019-11-28 (×37): 60 mL
  Filled 2019-11-14 (×45): qty 60

## 2019-11-14 MED ORDER — ENOXAPARIN SODIUM 60 MG/0.6ML ~~LOC~~ SOLN: 60.0000 mg | SUBCUTANEOUS | Status: DC

## 2019-11-14 MED ORDER — MELATONIN 3 MG PO TABS: 6.0000 mg | ORAL_TABLET | Freq: Every day | ORAL | 0 refills | Status: DC

## 2019-11-14 MED ORDER — PSYLLIUM 95 % PO PACK: 1.0000 | PACK | Freq: Three times a day (TID) | ORAL | Status: DC

## 2019-11-14 MED ORDER — CLONAZEPAM 1 MG PO TBDP: 1.0000 mg | ORAL_TABLET | Freq: Every day | ORAL | 0 refills | Status: DC

## 2019-11-14 MED ORDER — HYDRALAZINE HCL 20 MG/ML IJ SOLN: 20.0000 mg | INTRAMUSCULAR | Status: DC | PRN

## 2019-11-14 MED ORDER — CALCIUM CARBONATE ANTACID 500 MG PO CHEW: 1.0000 | CHEWABLE_TABLET | Freq: Three times a day (TID) | ORAL | Status: DC | PRN

## 2019-11-14 MED ORDER — MAGNESIUM SULFATE 2 GM/50ML IV SOLN
2.0000 g | Freq: Once | INTRAVENOUS | Status: DC
  Administered 2019-11-14: 14:00:00 2 g via INTRAVENOUS
  Filled 2019-11-14: qty 50

## 2019-11-14 MED ORDER — SPIRONOLACTONE 25 MG PO TABS
25.0000 mg | ORAL_TABLET | Freq: Every day | ORAL | Status: DC
  Administered 2019-11-15 – 2019-12-04 (×20): 25 mg via ORAL
  Filled 2019-11-14 (×20): qty 1

## 2019-11-14 MED ORDER — LOPERAMIDE HCL 1 MG/7.5ML PO SUSP: 2.0000 mg | ORAL | 0 refills | Status: DC | PRN

## 2019-11-14 MED ORDER — PSYLLIUM 95 % PO PACK
1.0000 | PACK | Freq: Three times a day (TID) | ORAL | Status: DC
  Administered 2019-11-14 – 2019-12-04 (×31): 1 via ORAL
  Filled 2019-11-14 (×62): qty 1

## 2019-11-14 MED ORDER — ESCITALOPRAM OXALATE 20 MG PO TABS: 20.0000 mg | ORAL_TABLET | Freq: Every day | ORAL | Status: DC

## 2019-11-14 MED ORDER — ALBUTEROL SULFATE (2.5 MG/3ML) 0.083% IN NEBU: 2.5000 mg | INHALATION_SOLUTION | RESPIRATORY_TRACT | Status: DC | PRN

## 2019-11-14 MED ORDER — CALCIUM POLYCARBOPHIL 625 MG PO TABS: 625.0000 mg | ORAL_TABLET | Freq: Every day | ORAL | Status: DC

## 2019-11-14 MED ORDER — AMLODIPINE BESYLATE 10 MG PO TABS: 10.0000 mg | ORAL_TABLET | Freq: Every day | ORAL | Status: DC

## 2019-11-14 MED ORDER — SALINE SPRAY 0.65 % NA SOLN: 1.0000 | NASAL | 0 refills | Status: DC | PRN

## 2019-11-14 MED ORDER — CHLORHEXIDINE GLUCONATE 0.12 % MT SOLN
15.0000 mL | Freq: Two times a day (BID) | OROMUCOSAL | Status: DC
  Administered 2019-11-14 – 2019-12-02 (×24): 15 mL via OROMUCOSAL
  Filled 2019-11-14 (×40): qty 15

## 2019-11-14 MED ORDER — ESCITALOPRAM OXALATE 10 MG PO TABS
20.0000 mg | ORAL_TABLET | Freq: Every day | ORAL | Status: DC
  Administered 2019-11-15 – 2019-12-04 (×20): 20 mg via ORAL
  Filled 2019-11-14 (×20): qty 2

## 2019-11-14 MED ORDER — OSMOLITE 1.2 CAL PO LIQD
900.0000 mL | ORAL | Status: DC
  Filled 2019-11-14: qty 948

## 2019-11-14 MED ORDER — OSMOLITE 1.5 CAL PO LIQD
1000.0000 mL | ORAL | Status: DC
  Administered 2019-11-14 – 2019-11-26 (×13): 1000 mL
  Filled 2019-11-14 (×14): qty 1000

## 2019-11-14 MED ORDER — CLONAZEPAM 0.25 MG PO TBDP
1.0000 mg | ORAL_TABLET | Freq: Every day | ORAL | Status: DC
  Administered 2019-11-14 – 2019-12-03 (×20): 1 mg via ORAL
  Filled 2019-11-14 (×21): qty 4

## 2019-11-14 MED ORDER — ASCORBIC ACID 500 MG PO TABS
500.0000 mg | ORAL_TABLET | Freq: Every day | ORAL | Status: DC
  Administered 2019-11-15 – 2019-12-04 (×20): 500 mg via ORAL
  Filled 2019-11-14 (×20): qty 1

## 2019-11-14 MED ORDER — LORATADINE 10 MG PO TABS
10.0000 mg | ORAL_TABLET | Freq: Every day | ORAL | Status: DC
  Administered 2019-11-15 – 2019-12-04 (×20): 10 mg via ORAL
  Filled 2019-11-14 (×20): qty 1

## 2019-11-14 MED ORDER — TAMSULOSIN HCL 0.4 MG PO CAPS
0.4000 mg | ORAL_CAPSULE | Freq: Every day | ORAL | Status: DC
  Administered 2019-11-14 – 2019-12-03 (×20): 0.4 mg via ORAL
  Filled 2019-11-14 (×20): qty 1

## 2019-11-14 MED ORDER — CARVEDILOL 25 MG PO TABS
25.0000 mg | ORAL_TABLET | Freq: Two times a day (BID) | ORAL | Status: DC
  Administered 2019-11-14 – 2019-12-04 (×40): 25 mg via ORAL
  Filled 2019-11-14 (×40): qty 1

## 2019-11-14 MED ORDER — CARVEDILOL 25 MG PO TABS: 25.0000 mg | ORAL_TABLET | Freq: Two times a day (BID) | ORAL | Status: DC

## 2019-11-14 MED ORDER — TRAZODONE HCL 100 MG PO TABS: 100.0000 mg | ORAL_TABLET | Freq: Every day | ORAL | Status: DC

## 2019-11-14 MED ORDER — SORBITOL 70 % SOLN: 30.0000 mL | Freq: Every day | Status: DC | PRN

## 2019-11-14 MED ORDER — LOPERAMIDE HCL 1 MG/7.5ML PO SUSP
2.0000 mg | ORAL | Status: DC | PRN
  Administered 2019-11-15: 2 mg
  Filled 2019-11-14 (×2): qty 15

## 2019-11-14 MED ORDER — MONTELUKAST SODIUM 10 MG PO TABS
10.0000 mg | ORAL_TABLET | Freq: Every day | ORAL | Status: DC
  Administered 2019-11-14 – 2019-12-03 (×20): 10 mg via ORAL
  Filled 2019-11-14 (×20): qty 1

## 2019-11-14 MED ORDER — HYDROMORPHONE HCL 2 MG PO TABS
2.0000 mg | ORAL_TABLET | Freq: Four times a day (QID) | ORAL | Status: DC | PRN
  Administered 2019-11-14 – 2019-11-30 (×34): 2 mg
  Filled 2019-11-14 (×34): qty 1

## 2019-11-14 MED ORDER — SPIRONOLACTONE 25 MG PO TABS: 25.0000 mg | ORAL_TABLET | Freq: Every day | ORAL | Status: DC

## 2019-11-14 MED ORDER — INSULIN ASPART 100 UNIT/ML ~~LOC~~ SOLN
0.0000 [IU] | Freq: Four times a day (QID) | SUBCUTANEOUS | Status: DC
  Administered 2019-11-16 – 2019-11-17 (×4): 2 [IU] via SUBCUTANEOUS

## 2019-11-14 MED ORDER — MELATONIN 3 MG PO TABS
6.0000 mg | ORAL_TABLET | Freq: Every day | ORAL | Status: DC
  Administered 2019-11-14 – 2019-12-03 (×20): 6 mg via ORAL
  Filled 2019-11-14 (×20): qty 2

## 2019-11-14 NOTE — Progress Notes (Signed)
Occupational Therapy Treatment Patient Details Name: [name] MRN: [id number] DOB: 01/21/1976 Today's Date: 11/14/2019    History of present illness 44 y/o M admitted 12/14 with COVID pneumonia causing acute hypoxemic respiratory failure.   Developed pneumomediastinum 12/23 with concern for possible bilateral small pneumothoraces on 12/24. Pt intubated on 12/26 and started on ECMO on 12/27. Tracheostomy on 12/29. Pt on ECMO until 3/4. Pt on vent until 3/14 and trach removed 4/2.  PMH - asthma, htn, obesity, rt shoulder I&D.    OT comments  Excellent session today. Pt maximove lifted to chair - focus of session on transitional weight shifts to assist with scooting anteriorly/posteriorly and laterally in chair. Pt's favorite music choice incorporated into session with pt "dancing" and "rapping" during songs. Pt using BUE to help push as well and pushing through BLE. With +2Max A used to achieve semi-standing position with pt able to clear buttocks at least 10 inches from recliner with increased head and trunk extension. Pt using BUE in seated position to assist with simulated bathing task and able to move hands from hip to mid calf then return to unsupported sitting with S. Set up with tray so that pt can independently grasp cup and bring to mouth using L hand. Pt independently using cell phone with L hand. Pt has made excellent progress and will continue to progress on CIR. Pt expressing his gratitude at end of session. All further Ot to be addressed on CIR.   Follow Up Recommendations  CIR;Supervision/Assistance - 24 hour    Equipment Recommendations  Other (comment)(TBA)    Recommendations for Other Services Rehab consult    Precautions / Restrictions Precautions Precautions: Fall;Other (comment) Required Braces or Orthoses: Other Brace Other Brace: B prevalon boots; R wrist cock-up splint       Mobility Bed Mobility               General bed mobility comments: Maximove  lifted out of bed  Transfers       Sit to Stand: Max assist;+2 physical assistance         General transfer comment: sit - stand with +2 Max A using momentum ( @ 8 trials)- pt ablet o push using B hands from chair and achieve oft off @ 10 inches form chiar. Last trial pt with increased chest and head extension.     Balance     Sitting balance-Leahy Scale: Fair Sitting balance - Comments: able to sit unsupported in chair with B feet on floor                                   ADL either performed or assessed with clinical judgement   ADL   Eating/Feeding: Set up Eating/Feeding Details (indicate cue type and reason): set for drinking out of lidded cup. LUE propped on 2 pillows with table top adjusted Grooming: Maximal assistance Grooming Details (indicate cue type and reason): ablet o hold cloth however needs assistance to bring to mouth/eyes due to elbow flex weakness Upper Body Bathing: Maximal assistance   Lower Body Bathing: Maximal assistance Lower Body Bathing Details (indicate cue type and reason): seated position., pt able to move hand up/down thigh and midway down shin     Lower Body Dressing: Total assistance               Functional mobility during ADLs: Maximal assistance;+2 for safety/equipment  Vision       Perception     Praxis      Cognition Arousal/Alertness: Awake/alert Behavior During Therapy: WFL for tasks assessed/performed Overall Cognitive Status: Within Functional Limits for tasks assessed                                 General Comments: Cognition continues to improve however slower on processing at times        Exercises General Exercises - Upper Extremity Shoulder Flexion: AROM;AAROM;20 reps;Supine Shoulder Extension: AROM;AAROM;Both;20 reps;Seated Shoulder ABduction: AROM;AAROM;Both;20 reps;Seated Shoulder ADduction: AROM;AAROM;Strengthening;Both;15 reps;Supine Elbow Flexion:  PROM;AROM;AAROM;15 reps;Seated;Supine Elbow Extension: AROM;Both;20 reps;Theraband Digit Composite Flexion: Both;20 reps;Seated;Squeeze ball;Strengthening;AROM Composite Extension: AAROM;AROM;Both;15 reps;Seated Other Exercises Other Exercises: lateral leans pushign back to midline while in chair Other Exercises: reaching to opposite armrest to encourage trunk rotation and and weight shift through pelvis to assist with scooitng Other Exercises: lap slides x 10 B Other Exercises: posterior shoulder girdle strengthening ex   Shoulder Instructions       General Comments      Pertinent Vitals/ Pain       Pain Assessment: Faces Faces Pain Scale: Hurts little more Pain Location: Buttocks(B UE; R knee) Pain Descriptors / Indicators: Grimacing;Guarding Pain Intervention(s): Limited activity within patient's tolerance  Home Living                                          Prior Functioning/Environment              Frequency  Min 3X/week        Progress Toward Goals  OT Goals(current goals can now be found in the care plan section)  Progress towards OT goals: Progressing toward goals  Acute Rehab OT Goals Patient Stated Goal: regain strength and independence OT Goal Formulation: With patient Time For Goal Achievement: 11/20/19 Potential to Achieve Goals: Good ADL Goals Pt Will Perform Eating: with mod assist;with adaptive utensils;sitting Pt Will Perform Grooming: with max assist;sitting;with adaptive equipment Pt Will Perform Upper Body Bathing: with max assist;sitting Pt/caregiver will Perform Home Exercise Program: Increased strength;Increased ROM;Both right and left upper extremity;With Supervision;With written HEP provided Additional ADL Goal #1: Pt will maintain midline postural control in supported sitting x 5 min with min guard A in preparation for ADL training Additional ADL Goal #2: Pt will complete sit - stand activity with Max A +2 x 1 min  with use of lift equipment  to increase strength for functional mobility Additional ADL Goal #3: Pt will be able to move Lt UE to mouth with mod A Additional ADL Goal #4: Pt will demonstrate B active elbow flexion in gravity eliminated position to increase funcitonal use of arms  Plan Discharge plan remains appropriate    Co-evaluation    PT/OT/SLP Co-Evaluation/Treatment: Yes Reason for Co-Treatment: Complexity of the patient's impairments (multi-system involvement);For patient/therapist safety;To address functional/ADL transfers   OT goals addressed during session: ADL's and self-care;Strengthening/ROM;Proper use of Adaptive equipment and DME      AM-PAC OT "6 Clicks" Daily Activity     Outcome Measure   Help from another person eating meals?: A Lot Help from another person taking care of personal grooming?: A Lot Help from another person toileting, which includes using toliet, bedpan, or urinal?: Total Help from another person bathing (including washing, rinsing, drying)?: A Lot  Help from another person to put on and taking off regular upper body clothing?: Total Help from another person to put on and taking off regular lower body clothing?: Total 6 Click Score: 9    End of Session Equipment Utilized During Treatment: Oxygen(part of session completed on RA)  OT Visit Diagnosis: Muscle weakness (generalized) (M62.81);Other abnormalities of gait and mobility (R26.89);Other symptoms and signs involving cognitive function;Pain Pain - part of body: (R knee; buttocks; B shoulders)   Activity Tolerance Patient tolerated treatment well   Patient Left in chair;with call bell/phone within reach   Nurse Communication Mobility status;Need for lift equipment        Time: 8675-4492 OT Time Calculation (min): 54 min  Charges: OT General Charges $OT Visit: 1 Visit OT Treatments $Self Care/Home Management : 8-22 mins $Neuromuscular Re-education: 8-22 mins  Maurie Boettcher, OT/L    Acute OT Clinical Specialist Acute Rehabilitation Services Pager [phone] Office [phone]    [facility] 11/14/2019, 1:51 PM

## 2019-11-14 NOTE — Progress Notes (Signed)
Inpatient Rehabilitation Admissions Coordinator  CIR bed is available to admit patient to today. I met with patient at bedside and he is aware as well as I spoke with his wife last evening. I contacted his Mom, per his request, to notify her of the move today. RN CM, Levada Dy, Dr. Orvan Seen and Dr. Haroldine Laws are aware. I will make the arrangements to admit today.  Danne [name], RN, MSN Rehab Admissions Coordinator [phone] 11/14/2019 10:52 AM

## 2019-11-14 NOTE — Discharge Summary (Addendum)
Advanced Heart Failure Team  Discharge Summary   Patient ID: [name] MRN: [id number], DOB/AGE: [birth year] 44 y.o. Admit date: 07/22/2019 D/C date:     11/14/2019   Primary Discharge Diagnoses:  COVID 19 Infection  COVID PNA/ ARDS Acute Hypoxic Respiratory Failure Requiring Intubation and VV ECMO  Bilateral Pneumothoraces Tracheostomy (decannulated 4/2) Deep Tissue Pressure Injury  S/p PEG Tube Placement  Diffuse Muscle Weakness (ICU/steroid/parlytic myopathy) Hypertension  Hypernatremia  Urinary Retention   Hospital Course:  44 y/o obese male with HTN, asthma, GERD admitted to [facility] early Dec 2020 with COVID PNA ultimately transferred to [facility] for higher level of care, treated w/ remdesivir, tociluzimab, Decadron and convalescent plasma. Remained markedly hypoxic despite full support w/ progressive worsening ARDS. Intubated 12/26. S/p bilateral CTs 12/26 for pneumothoraces.  TEE on 12/26 LVEF 70% RV ok.   VV ECMO initiated via femoral approach on 12/26 due to worsening respiratory failure  Tracheostomy 12/29.  COVID ARDS at this point. ECMO circuit changed 1/8 & 1/23. Crescent cannula placed 2/2 (femoral cannuals removed). ECMO decannulated 3/4. Trach decannulated 11/08/19. Required PEG tube placement for tube feeds.   Recovery c/b deep tissue pressure injury (bilateral heals and lateral feet) managed by wound care as well as  diffuse muscle weakness c/w ICU/steroid/paralytics myopathy, requiring daily PT/OT. CIR was recommended for continued therapy prior to full discharge home.    On 4/8, he was seen and examined by Dr. Haroldine Laws and felt to be medically stable for d/c to CIR.    Discharge Weight Range: 263 lb  Discharge Vitals: Blood pressure (!) 119/57, pulse 87, temperature 98.2 F (36.8 C), resp. rate (!) 22, height 5' 8"  (1.727 m), weight 119.3 kg, SpO2 99 %.   PHYSICAL EXAM: General:  Obese WM sitting up in bed. No respiratory difficulty HEENT: normal Neck: supple. no  JVD. Trach site bandaged Carotids 2+ bilat; no bruits. No lymphadenopathy or thyromegaly appreciated. Cor: PMI nondisplaced. Regular rate & rhythm. No rubs, gallops or murmurs. Lungs: clear Abdomen: obese but soft, nontender, nondistended. No hepatosplenomegaly. No bruits or masses. Good bowel sounds. + PEG tube  Extremities: no cyanosis, clubbing, rash, edema Neuro: alert & oriented x 3, cranial nerves grossly intact. moves all 4 extremities w/o difficulty. Affect pleasant.   Labs: Lab Results  Component Value Date   WBC 7.4 11/13/2019   HGB 12.4 (L) 11/13/2019   HCT 40.6 11/13/2019   MCV 89.4 11/13/2019   PLT 181 11/13/2019    Recent Labs  Lab 11/13/19 1040  NA 138  K 4.5  CL 98  CO2 29  BUN 46*  CREATININE 0.76  CALCIUM 9.6  GLUCOSE 139*   Lab Results  Component Value Date   CHOL 148 04/18/2017   HDL 41 04/18/2017   LDLCALC 95 04/18/2017   TRIG 185 (H) 09/06/2019   BNP (last 3 results) Recent Labs    07/22/19 1039  BNP 15.3    ProBNP (last 3 results) No results for input(s): PROBNP in the last 8760 hours.   Diagnostic Studies/Procedures   No results found.  Discharge Medications   Allergies as of 11/14/2019   No Known Allergies      Medication List     STOP taking these medications    budesonide-formoterol 160-4.5 MCG/ACT inhaler Commonly known as: Symbicort Replaced by: Symbicort 160-4.5 MCG/ACT inhaler   lisinopril-hydrochlorothiazide 10-12.5 MG tablet Commonly known as: ZESTORETIC   omeprazole 20 MG capsule Commonly known as: PRILOSEC       TAKE these  medications    acetaminophen 325 MG tablet Commonly known as: TYLENOL Place 2 tablets (650 mg total) into feeding tube every 6 (six) hours as needed for mild pain or headache (fever >/= 101).   amLODipine 10 MG tablet Commonly known as: NORVASC Take 1 tablet (10 mg total) by mouth daily. Start taking on: November 15, 2019   ascorbic acid 500 MG tablet Commonly known as: VITAMIN  C Take 1 tablet (500 mg total) by mouth daily. Start taking on: November 15, 2019   calcium carbonate 500 MG chewable tablet Commonly known as: TUMS - dosed in mg elemental calcium Chew 1 tablet (200 mg of elemental calcium total) by mouth 3 (three) times daily as needed for indigestion or heartburn.   carvedilol 25 MG tablet Commonly known as: COREG Take 1 tablet (25 mg total) by mouth 2 (two) times daily with a meal.   cetirizine 10 MG tablet Commonly known as: ZYRTEC Take 1 tablet (10 mg total) by mouth daily.   clonazePAM 1 MG disintegrating tablet Commonly known as: KLONOPIN Take 1 tablet (1 mg total) by mouth at bedtime.   escitalopram 20 MG tablet Commonly known as: LEXAPRO Take 1 tablet (20 mg total) by mouth daily. Start taking on: November 15, 2019   hydrALAZINE 20 MG/ML injection Commonly known as: APRESOLINE Inject 1 mL (20 mg total) into the vein every 4 (four) hours as needed.   loperamide HCl 1 MG/7.5ML suspension Commonly known as: IMODIUM Place 15 mLs (2 mg total) into feeding tube as needed for diarrhea or loose stools.   melatonin 3 MG Tabs tablet Take 2 tablets (6 mg total) by mouth at bedtime.   montelukast 10 MG tablet Commonly known as: SINGULAIR Take 1 tablet (10 mg total) by mouth at bedtime.   pantoprazole 40 MG tablet Commonly known as: PROTONIX Take 1 tablet (40 mg total) by mouth daily. Start taking on: November 15, 2019   polycarbophil 625 MG tablet Commonly known as: FIBERCON Take 1 tablet (625 mg total) by mouth daily. Start taking on: November 14, 5425   ProAir RespiClick 062 (90 Base) MCG/ACT Aepb Generic drug: Albuterol Sulfate Inhale 2 puffs into the lungs every 4 (four) hours as needed (Shortness of breath).   psyllium 95 % Pack Commonly known as: HYDROCIL/METAMUCIL Take 1 packet by mouth 3 (three) times daily.   sodium chloride 0.65 % Soln nasal spray Commonly known as: OCEAN Place 1 spray into both nostrils as needed for congestion.    spironolactone 25 MG tablet Commonly known as: ALDACTONE Take 1 tablet (25 mg total) by mouth daily. Start taking on: November 15, 2019   Symbicort 160-4.5 MCG/ACT inhaler Generic drug: budesonide-formoterol Inhale 2 puffs into the lungs daily as needed (cough or wheeze). Inhale two puffs ONCE daily to prevent cough or wheeze. Rinse, gargle, and spit after use. Replaces: budesonide-formoterol 160-4.5 MCG/ACT inhaler   tamsulosin 0.4 MG Caps capsule Commonly known as: FLOMAX Take 1 capsule (0.4 mg total) by mouth daily after supper.   traZODone 100 MG tablet Commonly known as: DESYREL Take 1 tablet (100 mg total) by mouth at bedtime.   triamcinolone 55 MCG/ACT Aero nasal inhaler Commonly known as: Nasacort Allergy 24HR Place 2 sprays into the nose daily. What changed:  when to take this reasons to take this        Disposition   The patient will be discharged in stable condition to CIR.        Duration of Discharge Encounter: Greater  than 35 minutes   Signed, Nelida Gores 11/14/2019, 4:48 PM   Patient seen and examined with the above-signed Advanced Practice Provider and/or Housestaff. I personally reviewed laboratory data, imaging studies and relevant notes. I independently examined the patient and formulated the important aspects of the plan. I have edited the note to reflect any of my changes or salient points. I have personally discussed the plan with the patient and/or family.  He continues to improve. Still quite weak. For d/c to CIR today. We will continue to follow.   Glori Bickers, MD  7:52 PM

## 2019-11-14 NOTE — Progress Notes (Signed)
Pt arrived to unit, pt is alert and oriented, able to make needs known, mom at bedside. Pt and mom oriented to rehab. Swallow instructions, wrist splint instructions, and chair position instructions placed at head of bed, as well as bed chart and position with time and goals pt has personal property including cell phone. Charger may be on prev floor

## 2019-11-14 NOTE — H&P (Signed)
Physical Medicine and Rehabilitation Admission H&P    No chief complaint on file. : HPI: [name] is a 44 year old right-handed male with history of asthma, hypertension and pancreatitis.  Per chart review patient lives with spouse independent prior to admission.  1 level home with 4 steps to entry.  Initially presented 07/22/2019 with Covid pneumonia causing acute respiratory failure.  He developed pneumomediastinum 12/23 with concern for possible bilateral small pneumothoraces 08/01/2019.  Patient initially placed on 10-day course of Decadron as well as REMDESIVIR and TOCILUZIMAB.  Patient required intubation and later underwent bilateral chest tube placements.  TEE 08/03/2019 ejection fraction 70% without emboli.  Long-term intubation required tracheostomy completed 08/06/2019 that has been downsized and decannulated 11/08/2019.  As well as PEG tube placement 09/11/2019 per Dr. Worthy Keeler.  He did require initial long-term broad-spectrum antibiotics.  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  Palliative care continues to follow to establish goals of care.  His diet has been advanced to regular thin liquids.  He was still receiving some nocturnal tube feeds for nutritional support.  Subcutaneous Lovenox for DVT prophylaxis.  Patient with deep tissue pressure injuries to bilateral heels and lateral feet followed by WOC with dressing changes as directed.  Therapy evaluations completed and patient was admitted for a comprehensive rehab program.  Pt reports most of his pain is "tailbone, back and knees". Taking Dilaudid 2 mg q6 hours prn Says doesn't have wound on backside- nurse agrees. Sleeps OK, not great, but didn't sleep well at home- it's chronic.  LBM this afternoon- had 2-3x/today.  Using bedpan currently for bowels.  Review of Systems  Constitutional: Positive for fever and malaise/fatigue.  HENT:  Negative for hearing loss.   Eyes: Negative for blurred vision and double vision.  Respiratory: Positive for cough, sputum production and shortness of breath.   Cardiovascular: Positive for leg swelling. Negative for chest pain and palpitations.  Gastrointestinal: Negative for constipation, heartburn, nausea and vomiting.       GERD  Genitourinary: Negative for dysuria, flank pain and hematuria.  Musculoskeletal: Positive for joint pain.  Skin: Negative for rash.  Neurological: Positive for sensory change, focal weakness and weakness.  Psychiatric/Behavioral: The patient has insomnia.   All other systems reviewed and are negative.  Past Medical History:  Diagnosis Date  . Asthma   . GERD (gastroesophageal reflux disease)   . Headache   . History of kidney stones    LEFT URETERAL STONE  . HTN (hypertension)   . Pancreatitis 2018   GALLBALDDER SLUDGE CAUSED ISSUED RESOLVED   Past Surgical History:  Procedure Laterality Date  . CANNULATION FOR ECMO (EXTRACORPOREAL MEMBRANE OXYGENATION) N/A 08/28/2019   Procedure: CANNULATION FOR VV ECMO (EXTRACORPOREAL MEMBRANE OXYGENATION);  Surgeon: Prescott Gum, Collier Salina, MD;  Location: [place];  Service: Open Heart Surgery;  Laterality: N/A;  CRESCENT CANNULA  . CANNULATION FOR ECMO (EXTRACORPOREAL MEMBRANE OXYGENATION) N/A 09/10/2019   Procedure: CANNULATION FOR ECMO (EXTRACORPOREAL MEMBRANE OXYGENATION) PUTTING IN CRESCENT 32FR CANNULA  AND REMOVING GROING CANNULATION;  Surgeon: Wonda Olds, MD;  Location: [place];  Service: Open Heart Surgery;  Laterality: N/A;  PUTTING IN CRESCENT/REMOVING GROIN CANNULATION  . CYSTOSCOPY/URETEROSCOPY/HOLMIUM LASER/STENT PLACEMENT Left 04/18/2019   Procedure: LEFT URETEROSCOPY/HOLMIUM LASER/STENT PLACEMENT;  Surgeon: Ardis Hughs, MD;  Location: [facility];  Service: Urology;  Laterality: Left;  . CYSTOSCOPY/URETEROSCOPY/HOLMIUM LASER/STENT PLACEMENT Left 05/02/2019   Procedure:  CYSTOSCOPY/URETEROSCOPY/HOLMIUM LASER/STENT EXCHANGE;  Surgeon:  Ardis Hughs, MD;  Location: [facility];  Service: Urology;  Laterality: Left;  . ECMO CANNULATION N/A 08/03/2019   Procedure: ECMO CANNULATION;  Surgeon: Wonda Olds, MD;  Location: [place] [facility];  Service: Cardiovascular;  Laterality: N/A;  . ESOPHAGOGASTRODUODENOSCOPY N/A 09/11/2019   Procedure: ESOPHAGOGASTRODUODENOSCOPY (EGD);  Surgeon: Wonda Olds, MD;  Location: [place];  Service: Thoracic;  Laterality: N/A;  . IR [place] Vivianne Master  10/14/2019  . IRRIGATION AND DEBRIDEMENT SHOULDER Right 09/29/2017   Procedure: IRRIGATION AND DEBRIDEMENT SHOULDER;  Surgeon: Leandrew Koyanagi, MD;  Location: [place];  Service: Orthopedics;  Laterality: Right;  . [place] SURGERY  2002  . NASAL ENDOSCOPY WITH EPISTAXIS CONTROL N/A 08/31/2019   Procedure: NASAL ENDOSCOPY WITH EPISTAXIS CONTROL WITH CAUTERIZATION;  Surgeon: Melida Quitter, MD;  Location: [place];  Service: ENT;  Laterality: N/A;  . PORTACATH PLACEMENT N/A 09/11/2019   Procedure: PEG TUBE INSERTION - BEDSIDE;  Surgeon: Wonda Olds, MD;  Location: [place];  Service: Thoracic;  Laterality: N/A;  . TEE WITHOUT CARDIOVERSION N/A 08/28/2019   Procedure: TRANSESOPHAGEAL ECHOCARDIOGRAM (TEE);  Surgeon: Prescott Gum, Collier Salina, MD;  Location: [place];  Service: Open Heart Surgery;  Laterality: N/A;  . TEE WITHOUT CARDIOVERSION N/A 09/10/2019   Procedure: TRANSESOPHAGEAL ECHOCARDIOGRAM (TEE);  Surgeon: Wonda Olds, MD;  Location: [place];  Service: Open Heart Surgery;  Laterality: N/A;   Family History  Problem Relation Age of Onset  . Asthma Brother   . Diabetes Father   . Hypertension Father   . Diabetes Paternal Grandmother   . Hypertension Paternal Grandmother   . Migraines Mother   . GER disease Mother   . Pancreatic cancer Maternal Grandmother   . Heart disease Maternal Grandfather    Social History:  reports that he has never smoked. He has never  used smokeless tobacco. He reports current alcohol use. He reports that he does not use drugs. Allergies: No Known Allergies Medications Prior to Admission  Medication Sig Dispense Refill  . acetaminophen (TYLENOL) 325 MG tablet Place 2 tablets (650 mg total) into feeding tube every 6 (six) hours as needed for mild pain or headache (fever >/= 101).    . Albuterol Sulfate (PROAIR RESPICLICK) 017 (90 Base) MCG/ACT AEPB Inhale 2 puffs into the lungs every 4 (four) hours as needed (Shortness of breath). 1 each 1  . [START ON 11/15/2019] amLODipine (NORVASC) 10 MG tablet Take 1 tablet (10 mg total) by mouth daily.    Derrill Memo ON 11/15/2019] ascorbic acid (VITAMIN C) 500 MG tablet Take 1 tablet (500 mg total) by mouth daily.    . calcium carbonate (TUMS - DOSED IN MG ELEMENTAL CALCIUM) 500 MG chewable tablet Chew 1 tablet (200 mg of elemental calcium total) by mouth 3 (three) times daily as needed for indigestion or heartburn.    . carvedilol (COREG) 25 MG tablet Take 1 tablet (25 mg total) by mouth 2 (two) times daily with a meal.    . cetirizine (ZYRTEC) 10 MG tablet Take 1 tablet (10 mg total) by mouth daily. 30 tablet 5  . clonazePAM (KLONOPIN) 1 MG disintegrating tablet Take 1 tablet (1 mg total) by mouth at bedtime. 30 tablet 0  . [START ON 11/15/2019] escitalopram (LEXAPRO) 20 MG tablet Take 1 tablet (20 mg total) by mouth daily.    . hydrALAZINE (APRESOLINE) 20 MG/ML injection Inject 1 mL (20 mg total) into the vein every 4 (four) hours as needed. 1  mL   . loperamide HCl (IMODIUM) 1 MG/7.5ML suspension Place 15 mLs (2 mg total) into feeding tube as needed for diarrhea or loose stools. 120 mL 0  . melatonin 3 MG TABS tablet Take 2 tablets (6 mg total) by mouth at bedtime.  0  . montelukast (SINGULAIR) 10 MG tablet Take 1 tablet (10 mg total) by mouth at bedtime.    Derrill Memo ON 11/15/2019] pantoprazole (PROTONIX) 40 MG tablet Take 1 tablet (40 mg total) by mouth daily.    Derrill Memo ON 11/15/2019]  polycarbophil (FIBERCON) 625 MG tablet Take 1 tablet (625 mg total) by mouth daily.    . psyllium (HYDROCIL/METAMUCIL) 95 % PACK Take 1 packet by mouth 3 (three) times daily. 240 each   . sodium chloride (OCEAN) 0.65 % SOLN nasal spray Place 1 spray into both nostrils as needed for congestion.  0  . [START ON 11/15/2019] spironolactone (ALDACTONE) 25 MG tablet Take 1 tablet (25 mg total) by mouth daily.    . SYMBICORT 160-4.5 MCG/ACT inhaler Inhale 2 puffs into the lungs daily as needed (cough or wheeze). Inhale two puffs ONCE daily to prevent cough or wheeze. Rinse, gargle, and spit after use. 10.2 g 5  . tamsulosin (FLOMAX) 0.4 MG CAPS capsule Take 1 capsule (0.4 mg total) by mouth daily after supper. 30 capsule   . traZODone (DESYREL) 100 MG tablet Take 1 tablet (100 mg total) by mouth at bedtime.    . triamcinolone (NASACORT ALLERGY 24HR) 55 MCG/ACT AERO nasal inhaler Place 2 sprays into the nose daily. (Patient taking differently: Place 2 sprays into the nose daily as needed (allergies). ) 1 Inhaler 5    Drug Regimen Review Drug regimen was reviewed and remains appropriate with no significant issues identified  Home:     Functional History:    Functional Status:  Mobility:          ADL:    Cognition: Cognition Orientation Level: Oriented X4    Physical Exam: Blood pressure (!) 104/55, pulse 84, temperature 98 F (36.7 C), resp. rate 16, SpO2 97 %. Physical Exam  Nursing note and vitals reviewed. Constitutional: He is oriented to person, place, and time. He appears well-developed and well-nourished.  Pt sitting up in sitting position because bed in sitting position, mother at bedside; BMI of 40- looks like has lost weight lately, somewhat anxious- tremors/shaking throughout interview and exam noted  HENT:  Head: Normocephalic and atraumatic.  Nose: Nose normal.  Mouth/Throat: No oropharyngeal exudate.  No facial droop Tongue midline- CN's appear intact  Eyes:  Conjunctivae are normal. Right eye exhibits no discharge. Left eye exhibits no discharge. No scleral icterus.  EOMI B/L; no nystagmus B/L  Neck: No tracheal deviation present.  Cardiovascular:  RRR- no JVD  Respiratory: No stridor.  CTA B/L- no accessory muscle use; O2 by [place] 2L- sats 97%  GI:  PEG tube in place abd protuberant, soft, NT, ND, (+)BS  Musculoskeletal:     Cervical back: Normal range of motion and neck supple.     Comments: RUE- delt 2-/5, biceps 4-/5, tricep 3/5, WE 3/5, grip 3/5, finger 2/5 LUE- delt 2-/5, biceps 2/5, triceps 3/5, WE 1/5, grip 2+/5, finger 1/5 LEs B/L- HF 2-/5, KE and KF 2/5, DF 1/5; PF 2/5   Neurological: He is alert and oriented to person, place, and time.  Patient is alert.  Mood is a bit flat but appropriate.  He provides his name age and date of birth as well  as follows simple commands. Pt reports sensation decreased in L thumb and RUE; normal in LEs to light touch No hoffman's or clonus B/L  Skin:  Has B/L anterior calf scrapes of skin- covered with dressings B/L- superficial scrapes/no sign of infection.  No skin breakdown anywhere else seen- bottom and heels  Psychiatric:  Anxious; cordial     Results for orders placed or performed during the hospital encounter of 11/14/19 (from the past 48 hour(s))  Glucose, capillary     Status: None   Collection Time: 11/14/19  4:51 PM  Result Value Ref Range   Glucose-Capillary 76 70 - 99 mg/dL    Comment: Glucose reference range applies only to samples taken after fasting for at least 8 hours.   No results found.     Medical Problem List and Plan: 1.  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  Antithrombotics: -DVT/anticoagulation: Lovenox 60 mg daily.  Vascular study negative  -antiplatelet therapy: N/A 3. Pain Management:  Dilaudid 2 mg every 6 hours as needed pain 4. Mood: Melatonin 6 mg nightly, trazodone 100 mg nightly, Klonopin 1 mg nightly, Lexapro 20 mg daily, Valium 2.5 mg every 12 hours as needed anxiety  -antipsychotic agents: N/A 5. Neuropsych: This patient is capable of making decisions on his own behalf. 6. Skin/Wound/ Care/deep tissue pressure injury bilateral heels and lateral feet: Wound care as directed 7. Fluids/Electrolytes/Nutrition: Strict INO's with follow-up chemistries 8.  Dysphagia.  Status post PEG tube 09/11/2019 per Dr. Worthy Keeler.  Diet has been advanced to regular.  Patient is still receiving nocturnal tube feeds by PEG tube. 9.  Status post tracheostomy 08/06/2019.  Decannulated 11/08/2019. 10.  Hypertension.  Norvasc 10 mg daily, Coreg 25 mg twice daily, Aldactone 25 mg daily.  Monitor with increased mobility 11.  History of asthma.  Continue nebulizers as directed 12.  GERD.  Protonix 13.  BPH.  Flomax 0.4 mg daily. 14. Morbid obesity- current BMI of 40- will discuss diet, etc once more appropriate.  15. Insomnia- con't meds as above  Jethro Bolus, PA-C 11/14/2019  I have personally performed a face to face diagnostic evaluation of this patient and formulated the key components of the plan.  Additionally, I have personally reviewed laboratory data, imaging studies, as well as relevant notes and concur with the physician assistant's documentation above.   The patient's status has not changed from the original H&P.  Any changes in documentation from the acute care chart have been noted above.     Courtney Heys, MD 11/14/2019

## 2019-11-14 NOTE — Discharge Instructions (Signed)
Inpatient Rehab Discharge Instructions  [name] Discharge date and time: No discharge date for patient encounter.   Activities/Precautions/ Functional Status: Activity: activity as tolerated Diet: regular diet Wound Care: keep wound clean and dry Functional status:  ___ No restrictions     ___ Walk up steps independently ___ 24/7 supervision/assistance   ___ Walk up steps with assistance ___ Intermittent supervision/assistance  ___ Bathe/dress independently ___ Walk with walker     _x__ Bathe/dress with assistance ___ Walk Independently    ___ Shower independently ___ Walk with assistance    ___ Shower with assistance ___ No alcohol     ___ Return to work/school ________  Special Instructions: No driving smoking or alcohol   My questions have been answered and I understand these instructions. I will adhere to these goals and the provided educational materials after my discharge from the hospital.  Patient/Caregiver Signature _______________________________ Date __________  Clinician Signature _______________________________________ Date __________  Please bring this form and your medication list with you to all your follow-up doctor's appointments.

## 2019-11-14 NOTE — Progress Notes (Signed)
Kirsteins, Luanna Salk, MD  Physician  Physical Medicine and Rehabilitation  Consult Note     Signed  Date of Service:  10/21/2019  9:59 AM      Related encounter: ED to Hosp-Admission (Discharged) from 07/22/2019 in [facility]      Signed      Expand AllCollapse All   Show:Clear all [x] Manual[x] Template[] Copied  Added by: [x] Angiulli, Lavon Paganini, PA-C[x] Kirsteins, Luanna Salk, MD  [] Hover for details          Physical Medicine and Rehabilitation Consult Reason for Consult: Decreased functional mobility Referring Physician: Dr. Haroldine Laws     HPI: [name] is a 44 y.o. right-handed male with history of asthma, hypertension, pancreatitis.  Presented 07/22/2019 with Covid pneumonia causing acute hypoxic respiratory failure.  Developed pneumomediastinum 1223 with concern for possible bilateral small pneumothoraces 08/01/2019.  Patient initially placed on a 10-day course of Decadron as well as remdesivir and tociluzimab.  Patient required intubation and later underwent bilateral chest tube placements.  TEE 08/03/2019 ejection fraction 70%.  Long-term intubation requiring tracheostomy 08/06/2019 as well as PEG tube placement 09/11/2019..  Maintained on broad-spectrum antibiotics.  Hospital course complicated by diffuse muscle weakness suspect myopathy with follow-up per neurology.  Follow-up per critical care medicine pulmonary services trach collar trials off vent currently with a #8 XLT trach.  Wound care nurse follow-up for bilateral lateral feet pressure injuries with wound care as directed.  Acute on chronic anemia 9.9 and monitored.  Therapy evaluations completed with recommendations of physical medicine rehab consult.   The patient is able to talk with the Passy-Muir valve.  He did not experience any respiratory distress. Patient is mildly lethargic, he denied any breathing issues today but did complain of knee pain.   Review of Systems  Unable to perform ROS:  Acuity of condition  Constitutional: Negative.   HENT: Negative for ear pain and nosebleeds.   Eyes: Negative for discharge and redness.  Respiratory: Positive for cough and sputum production. Negative for hemoptysis, shortness of breath and stridor.   Cardiovascular: Negative for chest pain and leg swelling.  Gastrointestinal: Negative for abdominal pain, nausea and vomiting.  Genitourinary: Negative for frequency and hematuria.  Musculoskeletal: Positive for joint pain. Negative for back pain.  Skin: Negative.   Neurological: Positive for sensory change, focal weakness and weakness.  Psychiatric/Behavioral: Negative for hallucinations.        Past Medical History:  Diagnosis Date  . Asthma    . GERD (gastroesophageal reflux disease)    . Headache    . History of kidney stones      LEFT URETERAL STONE  . HTN (hypertension)    . Pancreatitis 2018    GALLBALDDER SLUDGE CAUSED ISSUED RESOLVED         Past Surgical History:  Procedure Laterality Date  . CANNULATION FOR ECMO (EXTRACORPOREAL MEMBRANE OXYGENATION) N/A 08/28/2019    Procedure: CANNULATION FOR VV ECMO (EXTRACORPOREAL MEMBRANE OXYGENATION);  Surgeon: Prescott Gum, Collier Salina, MD;  Location: [place];  Service: Open Heart Surgery;  Laterality: N/A;  CRESCENT CANNULA  . CANNULATION FOR ECMO (EXTRACORPOREAL MEMBRANE OXYGENATION) N/A 09/10/2019    Procedure: CANNULATION FOR ECMO (EXTRACORPOREAL MEMBRANE OXYGENATION) PUTTING IN CRESCENT 32FR CANNULA  AND REMOVING GROING CANNULATION;  Surgeon: Wonda Olds, MD;  Location: [place];  Service: Open Heart Surgery;  Laterality: N/A;  PUTTING IN CRESCENT/REMOVING GROIN CANNULATION  . CYSTOSCOPY/URETEROSCOPY/HOLMIUM LASER/STENT PLACEMENT Left 04/18/2019    Procedure: LEFT URETEROSCOPY/HOLMIUM LASER/STENT PLACEMENT;  Surgeon: Louis Meckel  W, MD;  Location: [facility];  Service: Urology;  Laterality: Left;  . CYSTOSCOPY/URETEROSCOPY/HOLMIUM LASER/STENT PLACEMENT Left 05/02/2019     Procedure: CYSTOSCOPY/URETEROSCOPY/HOLMIUM LASER/STENT EXCHANGE;  Surgeon: Ardis Hughs, MD;  Location: [facility];  Service: Urology;  Laterality: Left;  . ECMO CANNULATION N/A 08/03/2019    Procedure: ECMO CANNULATION;  Surgeon: Wonda Olds, MD;  Location: [place] [facility];  Service: Cardiovascular;  Laterality: N/A;  . ESOPHAGOGASTRODUODENOSCOPY N/A 09/11/2019    Procedure: ESOPHAGOGASTRODUODENOSCOPY (EGD);  Surgeon: Wonda Olds, MD;  Location: [place];  Service: Thoracic;  Laterality: N/A;  . IR [place] Vivianne Master   10/14/2019  . IRRIGATION AND DEBRIDEMENT SHOULDER Right 09/29/2017    Procedure: IRRIGATION AND DEBRIDEMENT SHOULDER;  Surgeon: Leandrew Koyanagi, MD;  Location: [place];  Service: Orthopedics;  Laterality: Right;  . [place] SURGERY   2002  . NASAL ENDOSCOPY WITH EPISTAXIS CONTROL N/A 08/31/2019    Procedure: NASAL ENDOSCOPY WITH EPISTAXIS CONTROL WITH CAUTERIZATION;  Surgeon: Melida Quitter, MD;  Location: [place];  Service: ENT;  Laterality: N/A;  . PORTACATH PLACEMENT N/A 09/11/2019    Procedure: PEG TUBE INSERTION - BEDSIDE;  Surgeon: Wonda Olds, MD;  Location: [place];  Service: Thoracic;  Laterality: N/A;  . TEE WITHOUT CARDIOVERSION N/A 08/28/2019    Procedure: TRANSESOPHAGEAL ECHOCARDIOGRAM (TEE);  Surgeon: Prescott Gum, Collier Salina, MD;  Location: [place];  Service: Open Heart Surgery;  Laterality: N/A;  . TEE WITHOUT CARDIOVERSION N/A 09/10/2019    Procedure: TRANSESOPHAGEAL ECHOCARDIOGRAM (TEE);  Surgeon: Wonda Olds, MD;  Location: [place];  Service: Open Heart Surgery;  Laterality: N/A;         Family History  Problem Relation Age of Onset  . Asthma Brother    . Diabetes Father    . Hypertension Father    . Diabetes Paternal Grandmother    . Hypertension Paternal Grandmother    . Migraines Mother    . GER disease Mother    . Pancreatic cancer Maternal Grandmother    . Heart disease Maternal Grandfather      Social History:  reports that  he has never smoked. He has never used smokeless tobacco. He reports current alcohol use. He reports that he does not use drugs. Allergies: No Known Allergies       Medications Prior to Admission  Medication Sig Dispense Refill  . Albuterol Sulfate (PROAIR RESPICLICK) 885 (90 Base) MCG/ACT AEPB Inhale 2 puffs into the lungs every 4 (four) hours as needed (Shortness of breath). 1 each 1  . cetirizine (ZYRTEC) 10 MG tablet Take 1 tablet (10 mg total) by mouth daily. 30 tablet 5  . lisinopril-hydrochlorothiazide (ZESTORETIC) 10-12.5 MG tablet Take 1 tablet by mouth daily.      Marland Kitchen omeprazole (PRILOSEC) 20 MG capsule Take 40 mg by mouth daily.       Marland Kitchen triamcinolone (NASACORT ALLERGY 24HR) 55 MCG/ACT AERO nasal inhaler Place 2 sprays into the nose daily. (Patient taking differently: Place 2 sprays into the nose daily as needed (allergies). ) 1 Inhaler 5      Home: Home Living Family/patient expects to be discharged to:: Private residence Living Arrangements: Spouse/significant other Available Help at Discharge: Family Type of Home: House Home Access: Stairs to enter Technical brewer of Steps: 4 Entrance Stairs-Rails: Right, Left Home Layout: One level Bathroom Shower/Tub: Multimedia programmer: Standard Home Equipment: None Additional Comments: history obtained from chart as pt paralyzed and sedation being weaned  Functional History: Prior Function Level of Independence: Independent Comments: Pt independent in ADLs, IADLs, and mobility. Pt drives and works at [employer]. Pt does not use oxygen at home. Functional Status:  Mobility: Bed Mobility Overal bed mobility: Needs Assistance Bed Mobility: Rolling Rolling: Total assist Supine to sit: Max assist, +2 for physical assistance, +2 for safety/equipment Sit to supine: Max assist, +2 for physical assistance, +2 for safety/equipment General bed mobility comments: total to roll for placement of sling, minimal use of  BUEs due to decreased strength Transfers Overall transfer level: Needs assistance Equipment used: Ambulation equipment used Transfer via Lift Equipment: [employer] Transfers: Sit to/from Stand Sit to Stand: Supervision Stand pivot transfers: Min guard General transfer comment: transferred pt to chair with maxisky Ambulation/Gait General Gait Details: did not attempt ambulation yet   ADL: ADL Overall ADL's : Needs assistance/impaired Eating/Feeding: NPO Grooming: Maximal assistance, Wash/dry face, Oral care Grooming Details (indicate cue type and reason): assist for grip strength, maintaining arms up against gravity Upper Body Bathing: Supervision/ safety, Set up, Sitting Lower Body Bathing: Supervison/ safety, Set up, Sit to/from stand, Sitting/lateral leans Upper Body Dressing : Supervision/safety, Set up, Sitting Lower Body Dressing: Supervision/safety, Set up, Sitting/lateral leans, Sit to/from stand(use of figure four) Toilet Transfer: Min guard, BSC Toileting- Clothing Manipulation and Hygiene: Min guard, Sit to/from stand, Sitting/lateral lean Functional mobility during ADLs: Min guard General ADL Comments: session focused on postural control and therapeutic exercise   Cognition: Cognition Overall Cognitive Status: Difficult to assess Orientation Level: Oriented X4 Cognition Arousal/Alertness: Awake/alert Behavior During Therapy: WFL for tasks assessed/performed Overall Cognitive Status: Difficult to assess Area of Impairment: Problem solving, Following commands Current Attention Level: Selective Following Commands: Follows one step commands consistently Safety/Judgement: Decreased awareness of safety, Decreased awareness of deficits Awareness: Emergent Problem Solving: Slow processing General Comments: following one step commands consistently, talking some with PMV, smiling appropriately at jokes. Difficult to assess due to: Tracheostomy   Blood pressure (!) 137/95,  pulse 97, temperature 98.1 F (36.7 C), temperature source Oral, resp. rate (!) 21, height 5' 8"  (1.727 m), weight 128 kg, SpO2 94 %. Physical Exam  Neurological:  Patient resting comfortably.  He does make eye contact with examiner.  He does follow some basic commands.  Overall examination limited due to tracheostomy tube.      General: No acute distress Mood and affect are appropriate Heart: Regular rate and rhythm no rubs murmurs or extra sounds Lungs: Clear to auscultation, breathing unlabored, no rales or wheezes Abdomen: Positive bowel sounds, soft nontender to palpation, nondistended Extremities: No clubbing, cyanosis, or edema Skin: No evidence of breakdown, no evidence of rash Neurologic: Cranial nerves II through XII intact, motor strength is trace bilateral deltoid, 0 bilateral bicep trace bilateral tricep to minus finger flexion bilaterally 0 finger extension bilaterally 0 hip flexion or knee extension bilaterally trace hip extension bilaterally 0 ankle dorsiflexion plantarflexion bilaterally sensory exam normal sensation to light touch and proprioception in bilateral upper and lower extremities Cerebellar exam normal finger to nose to finger as well as heel to shin in bilateral upper and lower extremities Musculoskeletal: Full range of motion in all 4 extremities. No joint swelling Lab Results Last 24 Hours       Results for orders placed or performed during the hospital encounter of 07/22/19 (from the past 24 hour(s))  Glucose, capillary     Status: Abnormal    Collection Time: 10/20/19 11:41 AM  Result Value Ref Range    Glucose-Capillary  102 (H) 70 - 99 mg/dL  Glucose, capillary     Status: Abnormal    Collection Time: 10/20/19  4:35 PM  Result Value Ref Range    Glucose-Capillary 106 (H) 70 - 99 mg/dL  Glucose, capillary     Status: Abnormal    Collection Time: 10/20/19  5:07 PM  Result Value Ref Range    Glucose-Capillary 120 (H) 70 - 99 mg/dL  Glucose, capillary      Status: None    Collection Time: 10/20/19  8:01 PM  Result Value Ref Range    Glucose-Capillary 82 70 - 99 mg/dL  Glucose, capillary     Status: Abnormal    Collection Time: 10/20/19 11:44 PM  Result Value Ref Range    Glucose-Capillary 104 (H) 70 - 99 mg/dL  Magnesium     Status: None    Collection Time: 10/21/19  4:40 AM  Result Value Ref Range    Magnesium 1.9 1.7 - 2.4 mg/dL  Phosphorus     Status: None    Collection Time: 10/21/19  4:40 AM  Result Value Ref Range    Phosphorus 3.2 2.5 - 4.6 mg/dL  Basic metabolic panel     Status: Abnormal    Collection Time: 10/21/19  4:40 AM  Result Value Ref Range    Sodium 142 135 - 145 mmol/L    Potassium 3.8 3.5 - 5.1 mmol/L    Chloride 101 98 - 111 mmol/L    CO2 31 22 - 32 mmol/L    Glucose, Bld 92 70 - 99 mg/dL    BUN 38 (H) 6 - 20 mg/dL    Creatinine, Ser 0.33 (L) 0.61 - 1.24 mg/dL    Calcium 8.9 8.9 - 10.3 mg/dL    GFR calc non Af Amer >60 >60 mL/min    GFR calc Af Amer >60 >60 mL/min    Anion gap 10 5 - 15  CBC     Status: Abnormal    Collection Time: 10/21/19  4:40 AM  Result Value Ref Range    WBC 6.0 4.0 - 10.5 K/uL    RBC 3.61 (L) 4.22 - 5.81 MIL/uL    Hemoglobin 9.9 (L) 13.0 - 17.0 g/dL    HCT 35.0 (L) 39.0 - 52.0 %    MCV 97.0 80.0 - 100.0 fL    MCH 27.4 26.0 - 34.0 pg    MCHC 28.3 (L) 30.0 - 36.0 g/dL    RDW 15.9 (H) 11.5 - 15.5 %    Platelets 249 150 - 400 K/uL    nRBC 0.0 0.0 - 0.2 %  Glucose, capillary     Status: None    Collection Time: 10/21/19  4:42 AM  Result Value Ref Range    Glucose-Capillary 81 70 - 99 mg/dL  Glucose, capillary     Status: Abnormal    Collection Time: 10/21/19  7:36 AM  Result Value Ref Range    Glucose-Capillary 123 (H) 70 - 99 mg/dL       Imaging Results (Last 48 hours)  DG CHEST PORT 1 VIEW   Result Date: 10/21/2019 CLINICAL DATA:  Tracheostomy, pneumonia, respiratory distress, asthma, hypertension EXAM: PORTABLE CHEST 1 VIEW COMPARISON:  Portable exam 0510 hours compared  to 10/16/2019 FINDINGS: Tip of tracheostomy tube projects 3.7 cm above carina. RIGHT arm PICC line tip projects over SVC. Enlargement of cardiac silhouette. Patchy BILATERAL pulmonary infiltrates greater in RIGHT upper lobe and LEFT base. No definite pleural effusion or pneumothorax. IMPRESSION: Persistent BILATERAL  pulmonary infiltrates consistent with multifocal pneumonia, slightly increased in RIGHT upper lobe since previous study. Electronically Signed   By: Lavonia Dana M.D.   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 Does the need for close, 24 hr/day medical supervision in concert with the patient's rehab needs make it unreasonable for this patient to be served in a less intensive setting? Potentially 2. Co-Morbidities requiring supervision/potential complications: History of asthma, morbid obesity, hypertension, chronic respiratory failure, trach dependent, dysphagia n.p.o. 3. Due to bladder management, bowel management, skin/wound care, disease management, medication administration, pain management and patient education, does the patient require 24 hr/day rehab nursing? Potentially 4. Does the patient require coordinated care of a physician, rehab nurse, therapy disciplines of PT, OT, speech to address physical and functional deficits in the context of the above medical diagnosis(es)? Yes Addressing deficits in the following areas: balance, endurance, locomotion, strength, transferring, bowel/bladder control, bathing, dressing, feeding, grooming, toileting and psychosocial support 5. Can the patient actively participate in an intensive therapy program of at least 3 hrs of therapy per day at least 5 days per week? No 6. The potential for patient to make measurable gains while on inpatient rehab is Currently poor in terms of making weekly gains 7. Anticipated functional outcomes upon discharge from inpatient rehab are n/a  with  PT, n/a with OT, n/a with SLP. 8. Estimated rehab length of stay to reach the above functional goals is: See below 9. Anticipated discharge destination: Other 10. Overall Rehab/Functional Prognosis: fair   RECOMMENDATIONS: This patient's condition is appropriate for continued rehabilitative care in the following setting: LTACH Patient has agreed to participate in recommended program. Potentially Note that insurance prior authorization may be required for reimbursement for recommended care.   Comment: Patient with severe weakness in all 4 limbs minimal movement, anticipate a very prolonged recovery lasting 6 months or more.  Has limited social support outside of hospital.  Would recommend that the patient starts in a lower level of rehab intensity such as LTAC and if patient makes progress then transfer to rehab.     Lavon Paganini Angiulli, PA-C 10/21/2019    "I have personally performed a face to face diagnostic evaluation of this patient.  Additionally, I have reviewed and concur with the physician assistant's documentation above." Charlett Blake M.D. [facility] FAAPM&R (Neuromuscular Med) Diplomate Am Board of Electrodiagnostic Med Fellow Am Board of Interventional Pain            Revision History                     Routing History

## 2019-11-14 NOTE — Progress Notes (Signed)
Cristina Gong, RN  Rehab Admission Coordinator  Physical Medicine and Rehabilitation  PMR Pre-admission     Signed  Date of Service:  11/13/2019  4:12 PM      Related encounter: ED to Hosp-Admission (Discharged) from 07/22/2019 in [facility]      Signed        Show:Clear all [x] Manual[x] Template[x] Copied  Added by: [x] Julious Payer Vertis Kelch, RN  [] Hover for details PMR Admission Coordinator Pre-Admission Assessment   Patient: [name] is an 44 y.o., male MRN: [id number] DOB: May 07, 1976 Height: 5' 8"  (172.7 cm) Weight: 119.3 kg                                                                                                                                                  Insurance Information HMO:     PPO: yes     PCP:      IPA:      80/20:      OTHER:  PRIMARY: [employer]      Policy#: [id number]      Subscriber: pt CM Name: Butch Penny      Phone#: [phone] ext [phone]     Fax#: Epic access Pre-Cert#: [id number] approved until 4/14 when updates are due. F/u with Oscar La phone [phone] fax [phone]      Employer: [employer] Benefits:  Phone #: [phone]     Name: 11/12/19 Eff. Date: 01/07/2008     Deduct: $1750      Out of Pocket Max: $4500      Life Max: none CIR: 80% after deductible      SNF: 80% days limited by medical neccesity review Outpatient: $60 per visit     Co-Pay: limited to 20 visits each Home Health: 80%      Co-Pay: 60 visits per year DME: 80%     Co-Pay: 20% Providers: in network  SECONDARY: none       Medicaid Application Date:       Case Manager:  Disability Application Date:       Case Worker:    The "Data Collection Information Summary" for patients in Inpatient Rehabilitation Facilities with attached "Privacy Act [place] Records" was provided and verbally reviewed with: N/A   Emergency Contact Information         Contact Information     Name Relation Home Work Mobile     Aldie Spouse [phone]   [place] Mother [phone]   [phone]       Current Medical History  Patient Admitting Diagnosis: 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   Initially presented 07/22/2019 with Covid pneumonia causing acute respiratory failure.  He developed pneumomediastinum 12/23 with concern for possible  bilateral small pneumothoraces 08/01/2019.  Patient initially placed on 10-day course of Decadron as well as REMDESIVIR and TOCILUZIMAB.  Patient required intubation and later underwent bilateral chest tube placements.  TEE 08/03/2019 ejection fraction 70% without emboli.  Long-term intubation required tracheostomy completed 08/06/2019 that has been downsized and de cannulated 11/08/2019.  As well as PEG tube placement 09/11/2019 per Dr. Worthy Keeler.  He did require initial long-term broad-spectrum antibiotics as well as ECMO. 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  Palliative care continues to follow to establish goals of care.  His diet has been advanced to mechanical soft thin liquids.  Subcutaneous Lovenox for DVT prophylaxis.  Patient with deep tissue pressure injuries to bilateral heels and lateral feet followed by WOC with dressing changes as directed.     Past Medical History      Past Medical History:  Diagnosis Date  . Asthma    . GERD (gastroesophageal reflux disease)    . Headache    . History of kidney stones      LEFT URETERAL STONE  . HTN (hypertension)    . Pancreatitis 2018    GALLBALDDER SLUDGE CAUSED ISSUED RESOLVED      Family History  family history includes Asthma in his brother; Diabetes in his father and paternal grandmother; GER disease in his mother; Heart disease in his maternal grandfather;  Hypertension in his father and paternal grandmother; Migraines in his mother; Pancreatic cancer in his maternal grandmother.   Prior Rehab/Hospitalizations:  Has the patient had prior rehab or hospitalizations prior to admission? Yes   Has the patient had major surgery during 100 days prior to admission? Yes   Current Medications    Current Facility-Administered Medications:  .  0.9 %  sodium chloride infusion, , Intravenous, PRN, Prescott Gum, Collier Salina, MD, Last Rate: 10 mL/hr at 11/09/19 1258, 250 mL at 11/09/19 1258 .  acetaminophen (TYLENOL) tablet 650 mg, 650 mg, Per Tube, Q6H PRN, Prescott Gum, Collier Salina, MD, 650 mg at 11/11/19 1619 .  albuterol (PROVENTIL) (2.5 MG/3ML) 0.083% nebulizer solution 2.5 mg, 2.5 mg, Nebulization, Q4H PRN, Wonda Olds, MD, 2.5 mg at 11/04/19 1937 .  amLODipine (NORVASC) tablet 10 mg, 10 mg, Oral, Daily, Atkins, Glenice Bow, MD, 10 mg at 11/13/19 1005 .  ascorbic acid (VITAMIN C) tablet 500 mg, 500 mg, Oral, Daily, Orvan Seen, Glenice Bow, MD, 500 mg at 11/14/19 0936 .  calcium carbonate (TUMS - dosed in mg elemental calcium) chewable tablet 200 mg of elemental calcium, 1 tablet, Oral, TID PRN, Clydell Hakim, MD, 200 mg of elemental calcium at 10/30/19 2100 .  carvedilol (COREG) tablet 25 mg, 25 mg, Oral, BID WC, Atkins, Glenice Bow, MD, 25 mg at 11/14/19 0936 .  chlorhexidine (PERIDEX) 0.12 % solution 15 mL, 15 mL, Mouth Rinse, BID, Marshall, Jessica, DO, 15 mL at 11/13/19 1006 .  Chlorhexidine Gluconate Cloth 2 % PADS 6 each, 6 each, Topical, Daily, Kipp Brood, MD, 6 each at 11/13/19 1400 .  clonazePAM (KLONOPIN) disintegrating tablet 1 mg, 1 mg, Oral, QHS, Atkins, Glenice Bow, MD, 1 mg at 11/13/19 2150 .  [COMPLETED] diazepam (VALIUM) tablet 10 mg, 10 mg, Oral, Q6H, 10 mg at 09/22/19 1123 **FOLLOWED BY** diazepam (VALIUM) tablet 2.5 mg, 2.5 mg, Per Tube, Q12H PRN, Candee Furbish, MD, 2.5 mg at 10/31/19 2124 .  enoxaparin (LOVENOX) injection 60 mg, 60 mg, Subcutaneous, Q24H,  Atkins, Glenice Bow, MD, 60 mg at 11/13/19 1007 .  escitalopram (LEXAPRO) tablet 20 mg, 20 mg, Oral, Daily, Orvan Seen, Glenice Bow, MD, 20 mg at 11/14/19 0934 .  feeding supplement (ENSURE ENLIVE) (ENSURE ENLIVE) liquid 237 mL, 237 mL, Oral, TID BM, Atkins, Glenice Bow, MD, 237 mL at 11/13/19 2153 .  feeding supplement (OSMOLITE 1.5 CAL) liquid 900 mL, 900 mL, Per Tube, Q24H, Atkins, Glenice Bow, MD, Stopped at 11/14/19 0900 .  feeding supplement (PRO-STAT SUGAR FREE 64) liquid 60 mL, 60 mL, Per Tube, QID, Atkins, Glenice Bow, MD, 60 mL at 11/13/19 2150 .  Gerhardt's butt cream, , Topical, Daily, Kipp Brood, MD, Given at 11/14/19 0940 .  hydrALAZINE (APRESOLINE) injection 20 mg, 20 mg, Intravenous, Q4H PRN, Kipp Brood, MD, 20 mg at 11/02/19 0307 .  HYDROmorphone (DILAUDID) tablet 2 mg, 2 mg, Per Tube, Q6H PRN, Wonda Olds, MD, 2 mg at 11/12/19 2248 .  insulin aspart (novoLOG) injection 0-15 Units, 0-15 Units, Subcutaneous, Q6H, Omar Person, NP, 2 Units at 11/14/19 0031 .  ipratropium-albuterol (DUONEB) 0.5-2.5 (3) MG/3ML nebulizer solution 3 mL, 3 mL, Nebulization, Q4H PRN, Kipp Brood, MD, 3 mL at 11/05/19 1917 .  loperamide HCl (IMODIUM) 1 MG/7.5ML suspension 2 mg, 2 mg, Per Tube, PRN, Bensimhon, Shaune Pascal, MD, 2 mg at 11/03/19 0422 .  loratadine (CLARITIN) tablet 10 mg, 10 mg, Oral, Daily, Margaretha Seeds, MD, 10 mg at 11/13/19 1005 .  MEDLINE mouth rinse, 15 mL, Mouth Rinse, q12n4p, Audria Nine, DO, 15 mL at 11/13/19 1615 .  melatonin tablet 6 mg, 6 mg, Oral, QHS, Atkins, Glenice Bow, MD, 6 mg at 11/13/19 2150 .  montelukast (SINGULAIR) tablet 10 mg, 10 mg, Oral, QHS, Atkins, Glenice Bow, MD, 10 mg at 11/13/19 2150 .  nutrition supplement (JUVEN) (JUVEN) powder packet 1 packet, 1 packet, Per Tube, BID BM, Orvan Seen, Glenice Bow, MD, 1 packet at 11/13/19 1400 .  ondansetron (ZOFRAN) injection 4 mg, 4 mg, Intravenous, Q6H PRN, Prescott Gum, Collier Salina, MD, 4 mg at 10/31/19 1024 .  pantoprazole  (PROTONIX) EC tablet 40 mg, 40 mg, Oral, Daily, Atkins, Glenice Bow, MD, 40 mg at 11/13/19 1006 .  polycarbophil (FIBERCON) tablet 625 mg, 625 mg, Oral, Daily, Clydell Hakim, MD, 625 mg at 11/13/19 1005 .  psyllium (HYDROCIL/METAMUCIL) packet 1 packet, 1 packet, Oral, TID, Atkins, Glenice Bow, MD, 1 packet at 11/13/19 1007 .  sodium chloride (OCEAN) 0.65 % nasal spray 1 spray, 1 spray, Each Nare, PRN, Prescott Gum, Peter, MD .  sodium chloride flush (NS) 0.9 % injection 10-40 mL, 10-40 mL, Intracatheter, Q12H, Orvan Seen, Glenice Bow, MD, 10 mL at 11/14/19 0941 .  sodium chloride flush (NS) 0.9 % injection 10-40 mL, 10-40 mL, Intracatheter, PRN, Atkins, Glenice Bow, MD .  spironolactone (ALDACTONE) tablet 25 mg, 25 mg, Oral, Daily, Bensimhon, Shaune Pascal, MD, 25 mg at 11/13/19 1006 .  tamsulosin (FLOMAX) capsule 0.4 mg, 0.4 mg, Oral, QPC supper, Clegg, Amy D, NP, 0.4 mg at 11/13/19 1816 .  traZODone (DESYREL) tablet 100 mg, 100 mg, Oral, QHS, Atkins, Glenice Bow, MD, 100 mg at 11/13/19 2149   Patients Current Diet:     Diet Order                      Diet regular Room service appropriate? Yes with Assist; Fluid consistency: Thin  Diet effective now                   Precautions / Restrictions Precautions Precautions: Fall, Other (comment) Precaution Comments:  PEG, bilat foot wounds, B shoulder impingement Other Brace: B prevalon boots; R wrist cock-up splint Restrictions Weight Bearing Restrictions: No RLE Weight Bearing: Non weight bearing LLE Weight Bearing: Non weight bearing Other Position/Activity Restrictions: due to tissue injury    Has the patient had 2 or more falls or a fall with injury in the past year?No   Prior Activity Level Community (5-7x/wk): independent and working pta   Prior Functional Level Prior Function Level of Independence: Independent Comments: Pt independent in ADLs, IADLs, and mobility. Pt drives and works at [employer]. Pt does not use oxygen at home.    Self Care: Did the patient need help bathing, dressing, using the toilet or eating?  Independent   Indoor Mobility: Did the patient need assistance with walking from room to room (with or without device)? Independent   Stairs: Did the patient need assistance with internal or external stairs (with or without device)? Independent   Functional Cognition: Did the patient need help planning regular tasks such as shopping or remembering to take medications? Independent   Home Assistive Devices / Equipment Home Assistive Devices/Equipment: None Home Equipment: None   Prior Device Use: Indicate devices/aids used by the patient prior to current illness, exacerbation or injury? None of the above   Current Functional Level Cognition   Overall Cognitive Status: Within Functional Limits for tasks assessed Difficult to assess due to: Tracheostomy Current Attention Level: Selective Orientation Level: Oriented X4 Following Commands: Follows one step commands consistently Safety/Judgement: Decreased awareness of deficits General Comments: slow processing at times; demonstrates increased awareness - voicing concerns about going to rehab due to changing floors adn nto knowing staff. Listened to conerns and supported pt in knowing that his feelings were normal and to be expected after being so sick and being on the same floor since December. .     Extremity Assessment (includes Sensation/Coordination)   Upper Extremity Assessment: RUE deficits/detail, LUE deficits/detail RUE Deficits / Details: When pt placed in gravity eliminated position, he is able to extend wrist to neutral x 4 before fatiging, and able to activate elbow flexion x 6 before fatiguing (1/5).   finger flexion ~50% composite flexion actively, shoulder 2-/5; tricep at least 3+/5  RUE Sensation: (unable to assess) RUE Coordination: decreased fine motor, decreased gross motor LUE Deficits / Details: Pt with 1+/5 - 2-/5 elbow flexion,  tricpe at least 3+/5; shoulder 2-/5; wrist 2+/5 ext, 2/5 flexion; and able to achieve ~80% composite flexion  LUE Sensation: (unable to assess) LUE Coordination: decreased fine motor, decreased gross motor  Lower Extremity Assessment: Defer to PT evaluation RLE Deficits / Details: ankle DF limited to -5 degrees bilaterally, knee flexion limited to ~90 degrees, hip flexion limited to ~50 degrees due to pain. Knee extension, abduction and adduction WFL bilaterally LLE Deficits / Details: ankle DF limited to -5 degrees bilaterally, knee flexion limited to ~90 degrees, hip flexion limited to ~50 degrees due to pain. Knee extension, abduction and adduction WFL bilaterally     ADLs   Overall ADL's : Needs assistance/impaired Eating/Feeding: Maximal assistance Eating/Feeding Details (indicate cue type and reason): using tubing and adapted cup with L elbow propped on pillows and towel on chest to allow gravity to help withelbow flexion Grooming: Maximal assistance, Wash/dry face Grooming Details (indicate cue type and reason): assisting with holding cloth to nose adn blowing nose; using grip to help hold cloth adn pinch nostral; wiping mouth with cloth Upper Body Bathing: Supervision/ safety, Set up, Sitting Lower  Body Bathing: Supervison/ safety, Set up, Sit to/from stand, Sitting/lateral leans Upper Body Dressing : Supervision/safety, Set up, Sitting Lower Body Dressing: Supervision/safety, Set up, Sitting/lateral leans, Sit to/from stand(use of figure four) Toilet Transfer: Min guard, BSC Toileting- Clothing Manipulation and Hygiene: Total assistance Toileting - Clothing Manipulation Details (indicate cue type and reason): pt asked for bed pan. Max A to roll with pt holding onto rail with UE Functional mobility during ADLs: +2 for physical assistance, Maximal assistance(with use of Denna Haggard) General ADL Comments: focused session on truncal and BUE exercise in chair position to prepare for BADL  engagement     Mobility   Overal bed mobility: Needs Assistance Bed Mobility: Rolling Rolling: Max assist, +2 for physical assistance Sidelying to sit: +2 for physical assistance, Max assist Supine to sit: HOB elevated, Mod assist, +2 for safety/equipment Sit to supine: Max assist, +2 for physical assistance General bed mobility comments: pt in seated position using lower bed rails to pull forward out of reclined position with Mod A. Pt working on holding once in upright position     Transfers   Overall transfer level: Needs assistance Equipment used: 2 person hand held assist Transfer via [place]: [employer] Transfers: Sit to/from [employer] to Stand: Max assist, +2 physical assistance Stand pivot transfers: Min guard General transfer comment: bed to chair via maxisky lift; once in recliner donned shoes and patient assisted to scoot to edge of chair, sit to squat x 5 with +2 A lifting with pad under hips, using momentum and blocking knees     Ambulation / Gait / Stairs / Wheelchair Mobility   Ambulation/Gait General Gait Details: did not attempt ambulation yet     Posture / Balance Dynamic Sitting Balance Sitting balance - Comments: min A for safety with balance at edge of chair Balance Overall balance assessment: Needs assistance Sitting-balance support: Feet supported Sitting balance-Leahy Scale: Poor Sitting balance - Comments: min A for safety with balance at edge of chair Postural control: (BUE supported on lift; able to bring self upright) Standing balance support: Bilateral upper extremity supported, During functional activity Standing balance-Leahy Scale: Zero Standing balance comment: Clarise Cruz plus in addition to +2 max assist to partially stand.      Special needs/care consideration BiPAP/CPAP n/a CPM n/a Continuous Drip IV midline single lumen left basilic Dialysis   N/a Life Vest n/a Oxygen O2 at 3 liters nasal cannula Special Bed KREG tilt bed Trach Size de  cannulated 11/08/2019 Wound Vac n/a Skin   Placed on 3/8 PEG LUQ with nocturnal feeds, on D3 diet with thin liquids followed by SLP    Meryle Ready, RN  Registered Nurse  WOC  Consult Note      Signed   Date of Service:  11/12/2019  8:20 AM               Signed          Show:Clear all [x] ?Manual[x] ?Template[] ?Copied   Added by: [x] ?Meryle Ready, RN   [] ?Hover for details [place] Nurse wound follow up Patient receiving care in [facility]. Wound type: Resolving DTPI to bilateral heels and lateral feet Measurement: Right foot eschar measures 17 cm x 6 cm.  Left foot eschar measures 20 cm x 6 cm Wound bed: 100% dry, hard, black, eschar Drainage (amount, consistency, odor) none Periwound: intact Dressing procedure/placement/frequency: Continue iodine/betadine to eschar. Conservative sharp wound debridement (CSWD performed at the bedside): of easily lifted, peeling black eschar. Monitor the wound area(s) for  worsening of condition such as: Signs/symptoms of infection,  Increase in size,  Development of or worsening of odor, Development of pain, or increased pain at the affected locations.  Notify the medical team if any of these develop. Val Riles, RN, MSN, CWOCN, CNS-BC, pager [phone]                Bowel mgmt: continent Bladder mgmt: external catheter Diabetic mgmt n/a Behavioral consideration  Some problems with sleep and anxiety Chemo/radiation n/a Designated visitors are wife, [name] and mother, Helene Kelp    Previous Home Environment  Living Arrangements: Spouse/significant other(and 45 year old son)  Lives With: Spouse, Son Available Help at Discharge: (wife works 4 pm until 62 am; 49 year old son at home) Type of Home: [place]: One level Home Access: Stairs to enter Entrance Stairs-Rails: Right, Left Entrance Stairs-Number of Steps: 4 Bathroom Shower/Tub: Multimedia programmer: Programmer, systems: Yes How Accessible:  Accessible via walker [place]: No Additional Comments: history obtained from chart as pt paralyzed and sedation being weaned   Discharge Living Setting Plans for Discharge Living Setting: Patient's home, Lives with (comment)(spouse and 71 year old son) Type of Home at Discharge: House Discharge Home Layout: One level Discharge Home Access: Stairs to enter Entrance Stairs-Rails: Right, Left Entrance Stairs-Number of Steps: 4 Discharge Bathroom Shower/Tub: Walk-in shower Discharge Bathroom Toilet: Standard Discharge Bathroom Accessibility: Yes How Accessible: Accessible via walker Does the patient have any problems obtaining your medications?: No   Social/Family/Support Systems Patient Roles: Spouse, Parent(employee) Contact Information: wife, Oceanographer Anticipated Caregiver: wife, mother, son Anticipated Ambulance person Information: see above Ability/Limitations of Caregiver: wife works 4 pm until 12 am Caregiver Availability: (wife and patient have not made 24/7 arrangements ) Discharge Plan Discussed with Primary Caregiver: Yes Is Caregiver In Agreement with Plan?: Yes Does Caregiver/Family have Issues with Lodging/Transportation while Pt is in Rehab?: No     Patietnt;s Mom is 73 years old and avialalbe, but patient and his wife have not yet spoken to her about her ability to asisst when wife works when he first discharges home.   Goals/Additional Needs Patient/Family Goal for Rehab: supervision to min assist with PT and OT, supervision with SLP Expected length of stay: [facility]OS 21 to 28 days Equipment Needs: KREG tilt bed; lift to asisst up to chair Special Service Needs: Patient with anxiety Pt/Family Agrees to Admission and willing to participate: Yes Program Orientation Provided & Reviewed with Pt/Caregiver Including Roles  & Responsibilities: Yes  Barriers to Discharge: Decreased caregiver support, Insurance for SNF coverage  Barriers to Discharge Comments: wife  has asked about SNF after CIR; [employer] may not approve both CIR adn SNF and patient and wife are aware   Decrease burden of Care through IP rehab admission: Decrease number of caregivers   Possible need for SNF placement upon discharge: patient may need SNF after CIR but I have explained to patient and wife that [employer] may consider that custodial and may not cover SNF after CIR   Patient Condition: This patient's medical and functional status has changed since the consult dated 10/21/2019 in which the Rehabilitation Physician documented that the patient was not appropriate for intensive rehabilitative care in an inpatient rehabilitation facility. Due to change in status and issues being addressed, patient's case has been discussed with Dr. Naaman Plummer and patient now appropriate for inpatient rehabilitation. Will admit to inpatient rehab today.   Preadmission Screen Completed By:  Cleatrice Burke, RN, 11/14/2019 11:01 AM  ______________________________________________________________________   Discussed status with Dr. Dagoberto Ligas on 11/14/2019 at 56 and received approval for admission today.   Admission Coordinator:  Cleatrice Burke, time 1100 Date 11/14/2019        Cosigned by: Courtney Heys, MD at 11/14/2019 11:03 AM  Revision History

## 2019-11-15 ENCOUNTER — Inpatient Hospital Stay (HOSPITAL_COMMUNITY): Payer: Managed Care, Other (non HMO)

## 2019-11-15 ENCOUNTER — Inpatient Hospital Stay (HOSPITAL_COMMUNITY): Payer: Managed Care, Other (non HMO) | Admitting: Speech Pathology

## 2019-11-15 LAB — CBC WITH DIFFERENTIAL/PLATELET
Abs Immature Granulocytes: 0.05 10*3/uL (ref 0.00–0.07)
Basophils Absolute: 0 10*3/uL (ref 0.0–0.1)
Basophils Relative: 1 %
Eosinophils Absolute: 0.4 10*3/uL (ref 0.0–0.5)
Eosinophils Relative: 5 %
HCT: 38.2 % — ABNORMAL LOW (ref 39.0–52.0)
Hemoglobin: 11.9 g/dL — ABNORMAL LOW (ref 13.0–17.0)
Immature Granulocytes: 1 %
Lymphocytes Relative: 26 %
Lymphs Abs: 1.8 10*3/uL (ref 0.7–4.0)
MCH: 27.5 pg (ref 26.0–34.0)
MCHC: 31.2 g/dL (ref 30.0–36.0)
MCV: 88.2 fL (ref 80.0–100.0)
Monocytes Absolute: 0.5 10*3/uL (ref 0.1–1.0)
Monocytes Relative: 8 %
Neutro Abs: 4.1 10*3/uL (ref 1.7–7.7)
Neutrophils Relative %: 59 %
Platelets: 174 10*3/uL (ref 150–400)
RBC: 4.33 MIL/uL (ref 4.22–5.81)
RDW: 14.5 % (ref 11.5–15.5)
WBC: 6.8 10*3/uL (ref 4.0–10.5)
nRBC: 0 % (ref 0.0–0.2)

## 2019-11-15 LAB — GLUCOSE, CAPILLARY
Glucose-Capillary: 115 mg/dL — ABNORMAL HIGH (ref 70–99)
Glucose-Capillary: 117 mg/dL — ABNORMAL HIGH (ref 70–99)
Glucose-Capillary: 120 mg/dL — ABNORMAL HIGH (ref 70–99)
Glucose-Capillary: 91 mg/dL (ref 70–99)

## 2019-11-15 LAB — COMPREHENSIVE METABOLIC PANEL
ALT: 14 U/L (ref 0–44)
AST: 14 U/L — ABNORMAL LOW (ref 15–41)
Albumin: 2.8 g/dL — ABNORMAL LOW (ref 3.5–5.0)
Alkaline Phosphatase: 74 U/L (ref 38–126)
Anion gap: 13 (ref 5–15)
BUN: 46 mg/dL — ABNORMAL HIGH (ref 6–20)
CO2: 29 mmol/L (ref 22–32)
Calcium: 9.7 mg/dL (ref 8.9–10.3)
Chloride: 95 mmol/L — ABNORMAL LOW (ref 98–111)
Creatinine, Ser: 0.77 mg/dL (ref 0.61–1.24)
GFR calc Af Amer: >60 mL/min (ref >60)
GFR calc non Af Amer: >60 mL/min (ref >60)
Glucose, Bld: 131 mg/dL — ABNORMAL HIGH (ref 70–99)
Potassium: 4.1 mmol/L (ref 3.5–5.1)
Sodium: 137 mmol/L (ref 135–145)
Total Bilirubin: 0.2 mg/dL — ABNORMAL LOW (ref 0.3–1.2)
Total Protein: 7.4 g/dL (ref 6.5–8.1)

## 2019-11-15 NOTE — Evaluation (Signed)
Physical Therapy Assessment and Plan  Patient Details  Name: [name] MRN: [id number] Date of Birth: Apr 24, 1976  PT Diagnosis: Abnormality of gait, Contracture of joint: ankle/knee, Difficulty walking, Impaired sensation and Muscle weakness Rehab Potential: Good ELOS: 4 weeks   Today's Date: 11/15/2019 PT Individual Time: 3710-6269 PT Individual Time Calculation (min): 90 min    Problem List:  Patient Active Problem List   Diagnosis Date Noted  . 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  . History of COVID-19   . ARDS (adult respiratory distress syndrome) ([place])   . Pressure injury of skin 08/22/2019  . Need for emotional support   . Chest tube in place   . Personal history of ECMO   . Advanced care planning/counseling discussion   . Acute respiratory distress syndrome (ARDS) due to COVID-19 virus ([place])   . Palliative care by specialist   . Goals of care, counseling/discussion   . Advanced directives, counseling/discussion   . Subcutaneous crepitus   . Pneumonia due to COVID-19 virus 07/23/2019  . Acute respiratory failure ([place]) 07/22/2019  . Persistent asthma with undetermined severity   . Thrombocytopenia ([place])   . Leukopenia   . Fever   . Gram positive bacterial infection   . Septic arthritis of right acromioclavicular joint ([place]) 09/29/2017  . Chronic left-sided low back pain with left-sided sciatica 09/19/2017  . Epigastric pain   . Pancreatitis 04/17/2017  . Essential hypertension 04/17/2017  . Moderate persistent asthma 07/21/2015  . Allergic rhinoconjunctivitis 07/21/2015  . GERD (gastroesophageal reflux disease) 07/21/2015  . ABNORMALITY OF GAIT 11/12/2009  . TARSAL TUNNEL SYNDROME, LEFT 10/08/2009  . PES PLANUS 10/08/2009    Past Medical History:  Past Medical History:  Diagnosis Date  . Asthma   . GERD (gastroesophageal reflux disease)   . Headache   . History of kidney stones    LEFT URETERAL STONE  . HTN (hypertension)   . Pancreatitis 2018    GALLBALDDER SLUDGE CAUSED ISSUED RESOLVED   Past Surgical History:  Past Surgical History:  Procedure Laterality Date  . CANNULATION FOR ECMO (EXTRACORPOREAL MEMBRANE OXYGENATION) N/A 08/28/2019   Procedure: CANNULATION FOR VV ECMO (EXTRACORPOREAL MEMBRANE OXYGENATION);  Surgeon: Prescott Gum, Collier Salina, MD;  Location: [place];  Service: Open Heart Surgery;  Laterality: N/A;  CRESCENT CANNULA  . CANNULATION FOR ECMO (EXTRACORPOREAL MEMBRANE OXYGENATION) N/A 09/10/2019   Procedure: CANNULATION FOR ECMO (EXTRACORPOREAL MEMBRANE OXYGENATION) PUTTING IN CRESCENT 32FR CANNULA  AND REMOVING GROING CANNULATION;  Surgeon: Wonda Olds, MD;  Location: [place];  Service: Open Heart Surgery;  Laterality: N/A;  PUTTING IN CRESCENT/REMOVING GROIN CANNULATION  . CYSTOSCOPY/URETEROSCOPY/HOLMIUM LASER/STENT PLACEMENT Left 04/18/2019   Procedure: LEFT URETEROSCOPY/HOLMIUM LASER/STENT PLACEMENT;  Surgeon: Ardis Hughs, MD;  Location[facility];  Service: Urology;  Laterality: Left;  . CYSTOSCOPY/URETEROSCOPY/HOLMIUM LASER/STENT PLACEMENT Left 05/02/2019   Procedure: CYSTOSCOPY/URETEROSCOPY/HOLMIUM LASER/STENT EXCHANGE;  Surgeon: Ardis Hughs, MD;  [facility];  Service: Urology;  Laterality: Left;  . ECMO CANNULATION N/A 08/03/2019   Procedure: ECMO CANNULATION;  Surgeon: Wonda Olds, MD;  Location: [place] [facility];  Service: Cardiovascular;  Laterality: N/A;  . ESOPHAGOGASTRODUODENOSCOPY N/A 09/11/2019   Procedure: ESOPHAGOGASTRODUODENOSCOPY (EGD);  Surgeon: Wonda Olds, MD;  Location: [place];  Service: Thoracic;  Laterality: N/A;  . IR [place] Vivianne Master  10/14/2019  . IRRIGATION AND DEBRIDEMENT SHOULDER Right 09/29/2017   Procedure: IRRIGATION AND DEBRIDEMENT SHOULDER;  Surgeon: Leandrew Koyanagi, MD;  Location: [place];  Service: Orthopedics;  Laterality: Right;  . [place] SURGERY  2002  . NASAL ENDOSCOPY WITH EPISTAXIS CONTROL N/A 08/31/2019   Procedure:  NASAL ENDOSCOPY WITH EPISTAXIS CONTROL WITH CAUTERIZATION;  Surgeon: Melida Quitter, MD;  Location: [place];  Service: ENT;  Laterality: N/A;  . PORTACATH PLACEMENT N/A 09/11/2019   Procedure: PEG TUBE INSERTION - BEDSIDE;  Surgeon: Wonda Olds, MD;  Location: [place];  Service: Thoracic;  Laterality: N/A;  . TEE WITHOUT CARDIOVERSION N/A 08/28/2019   Procedure: TRANSESOPHAGEAL ECHOCARDIOGRAM (TEE);  Surgeon: Prescott Gum, Collier Salina, MD;  Location: [place];  Service: Open Heart Surgery;  Laterality: N/A;  . TEE WITHOUT CARDIOVERSION N/A 09/10/2019   Procedure: TRANSESOPHAGEAL ECHOCARDIOGRAM (TEE);  Surgeon: Wonda Olds, MD;  Location: [place];  Service: Open Heart Surgery;  Laterality: N/A;    Assessment & Plan Clinical Impression: [name] is a 44 year old right-handed male with history of asthma, hypertension and pancreatitis.  Per chart review patient lives with spouse independent prior to admission.  1 level home with 4 steps to entry.  Initially presented 07/22/2019 with Covid pneumonia causing acute respiratory failure.  He developed pneumomediastinum 12/23 with concern for possible bilateral small pneumothoraces 08/01/2019.  Patient initially placed on 10-day course of Decadron as well as REMDESIVIR and TOCILUZIMAB.  Patient required intubation and later underwent bilateral chest tube placements.  TEE 08/03/2019 ejection fraction 70% without emboli.  Long-term intubation required tracheostomy completed 08/06/2019 that has been downsized and decannulated 11/08/2019.  As well as PEG tube placement 09/11/2019 per Dr. Worthy Keeler.  He did require initial long-term broad-spectrum antibiotics.  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  Palliative care continues to follow to establish goals of care.  His diet has been advanced to regular thin liquids.  He was still receiving some nocturnal tube feeds for nutritional support.   Subcutaneous Lovenox for DVT prophylaxis.  Patient with deep tissue pressure injuries to bilateral heels and lateral feet followed by WOC with dressing changes as directed.  Therapy evaluations completed and patient was admitted for a comprehensive rehab program.  Patient transferred to CIR on 11/14/2019 .   Patient currently requires max assist of 2 with mobility secondary to muscle weakness and muscle joint tightness, decreased cardiorespiratoy endurance and decreased oxygen support, decreased coordination and decreased sitting balance, decreased standing balance, decreased postural control and decreased balance strategies.  Prior to hospitalization, patient was independent  with mobility and lived with Spouse, Son in a House home.  Home access is 6 per ptStairs to enter.  Patient will benefit from skilled PT intervention to maximize safe functional mobility, minimize fall risk and decrease caregiver burden for planned discharge home with intermittent assist.  Anticipate patient will benefit from follow up [facility] at discharge.  PT - End of Session Activity Tolerance: Tolerates 30+ min activity with multiple rests Endurance Deficit: Yes Endurance Deficit Description: Requires frequent rest breaks due to fatigue/pain PT Assessment Rehab Potential (ACUTE/IP ONLY): Good PT Barriers to Discharge: Inaccessible home environment;Decreased caregiver support;Home environment access/layout;Weight;Weight bearing restrictions;New oxygen PT Barriers to Discharge Comments: Pt w/somewhat flat affect, recommend further assessment PT Patient demonstrates impairments in the following area(s): Balance;Behavior;Endurance;Motor;Pain;Safety;Sensory;Skin Integrity PT Transfers Functional Problem(s): Bed Mobility;Bed to Chair;Car;Furniture PT Locomotion Functional Problem(s): Ambulation;Wheelchair Mobility;Stairs PT Plan PT Intensity: Minimum of 1-2 x/day ,45 to 90 minutes PT Frequency: 5 out of 7 days PT Duration Estimated  Length of Stay: 4 weeks PT Treatment/Interventions: Ambulation/gait training;Discharge planning;Functional mobility training;Psychosocial support;Therapeutic Activities;Balance/vestibular  training;Disease management/prevention;Neuromuscular re-education;Skin care/wound management;Therapeutic Exercise;Wheelchair propulsion/positioning;DME/adaptive equipment instruction;Pain management;Splinting/orthotics;UE/LE Strength taining/ROM;Patient/family education;Stair training;UE/LE Coordination activities PT Transfers Anticipated Outcome(s): min assist PT Locomotion Anticipated Outcome(s): mod assist PT Recommendation Follow Up Recommendations: Home health PT Patient destination: Home Equipment Recommended: To be determined  Skilled Therapeutic Intervention Evaluation completed (see details above and below) with education on PT POC and goals and individual treatment initiated with focus on functional mobility/transfers, LE strength, dynamic standing balance/coordination, ambulation, stair navigation, simulated car transfers, and improved endurance with activity.  Pt provided w/22in wide wc w/elevating leg rests to accomodate body habitus.  Pt instructed w/parts management, use of pressure relief cushion, and method of propulsion.  Introduced to Veterinary surgeon and [employer] and demonstrated use.  Pt then performed wc to/from bed transfers total assist of 2 required.  Pt educated re: process for determining LOS, team conference, equipment ordering, family education, scheduling, anticipated progression.  Oriented briefly to unit.   Pt repositioned in bed and instructed w/use of mediboots for pressure relief.  Boots then adjusted and positioned for pt.   W/activity during session - 02 sats 97% on 2L02, HR 97  PT Evaluation Precautions/Restrictions Precautions Precautions: Fall Precaution Comments: bilat wounds/feet, PEG Other Brace: B prevalon boots; R wrist cock-up splint Restrictions Weight Bearing  Restrictions: Yes(needs to be clarified due to wounds) RLE Weight Bearing: Non weight bearing LLE Weight Bearing: Non weight bearing Other Position/Activity Restrictions: to be clarified due to bilat wounds plantar and lateral aspect of feet General   Vital Signs  Pain Pain Assessment Pain Scale: 0-10 Pain Score: 5  Pain Type: Acute pain Pain Location: Back Pain Orientation: Right;Left Pain Radiating Towards: hips Pain Descriptors / Indicators: Aching;Sharp Pain Frequency: Intermittent Pain Onset: With Activity Patients Stated Pain Goal: 5 Pain Intervention(s): Medication (See eMAR) Home Living/Prior Functioning Home Living Available Help at Discharge: Available PRN/intermittently Type of Home: House Home Access: Stairs to enter [employer] of Steps: 6 per pt Entrance Stairs-Rails: Right;Left Home Layout: One level Bathroom Shower/Tub: Multimedia programmer: Standard Bathroom Accessibility: Yes Additional Comments: Home measurements will need to be collected to determine wc accessibility  Lives With: Spouse;Son Prior Function Level of Independence: Independent with basic ADLs;Independent with gait;Independent with homemaking with ambulation;Independent with transfers  Able to Take Stairs?: Yes Driving: Yes Vocation: Full time employment Vocation Requirements: Biochemist, clinical for [employer] Vision/Perception  Perception Perception: Within Functional Limits Praxis Praxis: Impaired Praxis Impairment Details: Motor planning  Cognition Overall Cognitive Status: Within Functional Limits for tasks assessed Arousal/Alertness: Awake/alert Orientation Level: Oriented X4 Attention: Focused Sensation Sensation Light Touch: Impaired by gross assessment Proprioception: Impaired by gross assessment(great toes) Coordination Gross Motor Movements are Fluid and Coordinated: No Fine Motor Movements are Fluid and Coordinated: No Coordination and Movement  Description: impaired due to significant LE/UE ROM and strength deficits Finger Nose Finger Test: unable Heel Shin Test: unable Motor  Motor Motor - Skilled Clinical Observations: profound strength deficits throughout UE deficits greater than LE  Mobility Bed Mobility Bed Mobility: Rolling Right;Rolling Left;Sit to Sidelying Left;Left Sidelying to Sit;Scooting to [facility] Rolling Right: 2 Helpers Rolling Left: 2 Helpers Right Sidelying to Sit: 2 Helpers Left Sidelying to Sit: 2 Helpers Sit to Sidelying Left: 2 Helpers Scooting to [facility]: 2 Helpers Transfers Transfers: Management consultant Transfers: 2 Press photographer (Assistive device): Other (Comment)(sliding board/Beasy board) Locomotion  Gait Ambulation: No Gait Gait: No Stairs / Additional Locomotion Stairs: No Wheelchair Mobility Wheelchair Mobility: Yes Wheelchair Assistance: Maximal Assistance - Patient 25 -  49% Wheelchair Propulsion: Both upper extremities Wheelchair Parts Management: Needs assistance Distance: 9f  Trunk/Postural Assessment  Cervical Assessment Cervical Assessment: Exceptions to WFL(generalized decreased ROM due to immobility) Thoracic Assessment Thoracic Assessment: Exceptions to WFL(see above) Lumbar Assessment Lumbar Assessment: Exceptions to WFL(see above) Postural Control Postural Control: Deficits on evaluation Postural Limitations: pain experienced w/mobility due to ROM limitations/immobility  Balance Balance Balance Assessed: Yes Dynamic Sitting Balance Sitting balance - Comments: able to sit on edge of bed w/feet unsupported w/min assist and UE support, requires assist w/sitting balance w/transfers due to weakness, sits unsupported in wc without difficulty Extremity Assessment  RUE Assessment RUE Assessment: Exceptions to [facility] Range of Motion (PROM) Comments: See OT eval for full assessment General Strength Comments: see ot eval for full assessment.  Grossly  <2/5shoulder, <2/5 elbow flexion and wrist extension, 2/5 triceps/wrist flexion LUE Assessment LUE Assessment: Exceptions to [facility] HospitalPass[facility] Range of Motion (PROM) Comments: See OT eval for full assessment General Strength Comments: grossly 3/5 wrist, 2/5 elbow, 1-2/5 shoulder RLE Assessment RLE Assessment: Exceptions to WPam [facility] Hospital Of BeaumontPassive Range of Motion (PROM) Comments: lacks approx 20* DF R ankle, knee flexion to 90 w/pain and spongy endfeel, RLE IR significantly limited/pt states premorbid General Strength Comments: global weakness grossly 2/5 hip and knee, 1/5 ankle LLE Assessment LLE Assessment: Within Functional Limits Passive Range of Motion (PROM) Comments: ankle lacks approx 45* DF, knee flexion to 80 w/pain, spongy endfeel General Strength Comments: Hip grossly 2/5, Knees grossly 2/5, ankle DF/PF 1/5  Skin - pt w/dressings to bilat feet/heels due to pressure injuries.    Refer to Care Plan for Long Term Goals  Recommendations for other services: Neuropsych  Discharge Criteria: Patient will be discharged from PT if patient refuses treatment 3 consecutive times without medical reason, if treatment goals not met, if there is a change in medical status, if patient makes no progress towards goals or if patient is discharged from hospital.  The above assessment, treatment plan, treatment alternatives and goals were discussed and mutually agreed upon: by patient  BJerrilyn Cairo4/04/2020, 12:15 PM

## 2019-11-15 NOTE — Progress Notes (Signed)
Social Work Assessment and Plan   Patient Details  Name: [name] MRN: [id number] Date of Birth: [birth year]  Today's Date: 11/15/2019  Problem List:  Patient Active Problem List   Diagnosis Date Noted  . 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  . History of COVID-19   . ARDS (adult respiratory distress syndrome) ([place])   . Pressure injury of skin 08/22/2019  . Need for emotional support   . Chest tube in place   . Personal history of ECMO   . Advanced care planning/counseling discussion   . Acute respiratory distress syndrome (ARDS) due to COVID-19 virus ([place])   . Palliative care by specialist   . Goals of care, counseling/discussion   . Advanced directives, counseling/discussion   . Subcutaneous crepitus   . Pneumonia due to COVID-19 virus 07/23/2019  . Acute respiratory failure ([place]) 07/22/2019  . Persistent asthma with undetermined severity   . Thrombocytopenia ([place])   . Leukopenia   . Fever   . Gram positive bacterial infection   . Septic arthritis of right acromioclavicular joint ([place]) 09/29/2017  . Chronic left-sided low back pain with left-sided sciatica 09/19/2017  . Epigastric pain   . Pancreatitis 04/17/2017  . Essential hypertension 04/17/2017  . Moderate persistent asthma 07/21/2015  . Allergic rhinoconjunctivitis 07/21/2015  . GERD (gastroesophageal reflux disease) 07/21/2015  . ABNORMALITY OF GAIT 11/12/2009  . TARSAL TUNNEL SYNDROME, LEFT 10/08/2009  . PES PLANUS 10/08/2009   Past Medical History:  Past Medical History:  Diagnosis Date  . Asthma   . GERD (gastroesophageal reflux disease)   . Headache   . History of kidney stones    LEFT URETERAL STONE  . HTN (hypertension)   . Pancreatitis 2018   GALLBALDDER SLUDGE CAUSED ISSUED RESOLVED   Past Surgical History:  Past Surgical History:  Procedure Laterality Date  . CANNULATION FOR ECMO (EXTRACORPOREAL MEMBRANE OXYGENATION) N/A 08/28/2019   Procedure: CANNULATION FOR VV ECMO (EXTRACORPOREAL  MEMBRANE OXYGENATION);  Surgeon: Prescott Gum, Collier Salina, MD;  Location: [place];  Service: Open Heart Surgery;  Laterality: N/A;  CRESCENT CANNULA  . CANNULATION FOR ECMO (EXTRACORPOREAL MEMBRANE OXYGENATION) N/A 09/10/2019   Procedure: CANNULATION FOR ECMO (EXTRACORPOREAL MEMBRANE OXYGENATION) PUTTING IN CRESCENT 32FR CANNULA  AND REMOVING GROING CANNULATION;  Surgeon: Wonda Olds, MD;  Location: [place];  Service: Open Heart Surgery;  Laterality: N/A;  PUTTING IN CRESCENT/REMOVING GROIN CANNULATION  . CYSTOSCOPY/URETEROSCOPY/HOLMIUM LASER/STENT PLACEMENT Left 04/18/2019   Procedure: LEFT URETEROSCOPY/HOLMIUM LASER/STENT PLACEMENT;  Surgeon: Ardis Hughs, MD;  [facility];  Service: Urology;  Laterality: Left;  . CYSTOSCOPY/URETEROSCOPY/HOLMIUM LASER/STENT PLACEMENT Left 05/02/2019   Procedure: CYSTOSCOPY/URETEROSCOPY/HOLMIUM LASER/STENT EXCHANGE;  Surgeon: Ardis Hughs, MD;  [facility];  Service: Urology;  Laterality: Left;  . ECMO CANNULATION N/A 08/03/2019   Procedure: ECMO CANNULATION;  Surgeon: Wonda Olds, MD;  Location: [place] [facility];  Service: Cardiovascular;  Laterality: N/A;  . ESOPHAGOGASTRODUODENOSCOPY N/A 09/11/2019   Procedure: ESOPHAGOGASTRODUODENOSCOPY (EGD);  Surgeon: Wonda Olds, MD;  Location: [place];  Service: Thoracic;  Laterality: N/A;  . IR [place] Vivianne Master  10/14/2019  . IRRIGATION AND DEBRIDEMENT SHOULDER Right 09/29/2017   Procedure: IRRIGATION AND DEBRIDEMENT SHOULDER;  Surgeon: Leandrew Koyanagi, MD;  Location: [place];  Service: Orthopedics;  Laterality: Right;  . [place] SURGERY  2002  . NASAL ENDOSCOPY WITH EPISTAXIS CONTROL N/A 08/31/2019   Procedure: NASAL ENDOSCOPY WITH EPISTAXIS CONTROL WITH CAUTERIZATION;  Surgeon: Melida Quitter, MD;  Location: [place];  Service: ENT;  Laterality: N/A;  . PORTACATH PLACEMENT N/A 09/11/2019   Procedure: PEG TUBE INSERTION - BEDSIDE;  Surgeon: Wonda Olds, MD;   [facility];  Service: Thoracic;  Laterality: N/A;  . TEE WITHOUT CARDIOVERSION N/A 08/28/2019   Procedure: TRANSESOPHAGEAL ECHOCARDIOGRAM (TEE);  Surgeon: Prescott Gum, Collier Salina, MD;  Location: [place];  Service: Open Heart Surgery;  Laterality: N/A;  . TEE WITHOUT CARDIOVERSION N/A 09/10/2019   Procedure: TRANSESOPHAGEAL ECHOCARDIOGRAM (TEE);  Surgeon: Wonda Olds, MD;  Location: [place];  Service: Open Heart Surgery;  Laterality: N/A;   Social History:  reports that he has never smoked. He has never used smokeless tobacco. He reports current alcohol use. He reports that he does not use drugs.  Family / Support Systems Marital Status: Married How Long?: 21 years Patient Roles: Spouse, Parent Spouse/Significant Other: Oceanographer (wife) Children: 75 y.o son Other Supports: None Anticipated Caregiver: TBD Ability/Limitations of Caregiver: wife works from 4pm-12am; minor son home during the day Caregiver Availability: Intermittent Family Dynamics: Pt lives at home with his wife and son  Social History Preferred language: English Religion: Baptist Cultural Background: Pt worked at [employer] for 12 yrs; last worked at time of hospital admission Education: high school Read: Yes Public relations account executive Issues: Denies Guardian/Conservator: N/A   Abuse/Neglect Abuse/Neglect Assessment Can Be Completed: Yes Physical Abuse: Denies Verbal Abuse: Denies Sexual Abuse: Denies Exploitation of patient/patient's resources: Denies Self-Neglect: Denies  Emotional Status Pt's affect, behavior and adjustment status: Pt appears to be adjusting to condition Recent Psychosocial Issues: Denies Psychiatric History: Denies Substance Abuse History: Denies  Patient / Family Perceptions, Expectations & Goals Pt/Family understanding of illness & functional limitations: Pt has general understanding of medical condition Premorbid pt/family roles/activities: Independent Anticipated  changes in roles/activities/participation: Assistnace with ADLs/IADLs Pt/family expectations/goals: "getting arm stronger; working on core, legs, knees, feet to get to walking again."  [employer]: None Premorbid Home Care/DME Agencies: None Transportation available at discharge: TBD Resource referrals recommended: Neuropsychology  Discharge Planning Living Arrangements: Spouse/significant other, Children Support Systems: Spouse/significant other, Children Type of Residence: Private residence Insurance Resources: Multimedia programmer (specify)([employer]) Financial Screen Referred: No Living Expenses: Own Money Management: Patient, Spouse Does the patient have any problems obtaining your medications?: No Social Work Anticipated Follow Up Needs: HH/OP  Clinical Impression SW met with pt in room to introduce self, explain role, and discuss discharge process. No HCPOA. Pt not a veteran. DME: crutches.Admits to EtOH use on occasions.   Rana Snare 11/15/2019, 6:51 PM

## 2019-11-15 NOTE — Evaluation (Signed)
Occupational Therapy Assessment and Plan  Patient Details  Name: [name] MRN: [id number] Date of Birth: 05/02/1976  OT Diagnosis: abnormal posture, acute pain, cognitive deficits, lumbago (low back pain), muscle weakness (generalized), pain in joint and swelling of limb Rehab Potential: Rehab Potential (ACUTE ONLY): Fair ELOS: 4-5 weeks   Today's Date: 11/15/2019 OT Individual Time: [id number] OT Individual Time Calculation (min): 63 min     Problem List:  Patient Active Problem List   Diagnosis Date Noted  . 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  . History of COVID-19   . ARDS (adult respiratory distress syndrome) ([place])   . Pressure injury of skin 08/22/2019  . Need for emotional support   . Chest tube in place   . Personal history of ECMO   . Advanced care planning/counseling discussion   . Acute respiratory distress syndrome (ARDS) due to COVID-19 virus ([place])   . Palliative care by specialist   . Goals of care, counseling/discussion   . Advanced directives, counseling/discussion   . Subcutaneous crepitus   . Pneumonia due to COVID-19 virus 07/23/2019  . Acute respiratory failure ([place]) 07/22/2019  . Persistent asthma with undetermined severity   . Thrombocytopenia ([place])   . Leukopenia   . Fever   . Gram positive bacterial infection   . Septic arthritis of right acromioclavicular joint ([place]) 09/29/2017  . Chronic left-sided low back pain with left-sided sciatica 09/19/2017  . Epigastric pain   . Pancreatitis 04/17/2017  . Essential hypertension 04/17/2017  . Moderate persistent asthma 07/21/2015  . Allergic rhinoconjunctivitis 07/21/2015  . GERD (gastroesophageal reflux disease) 07/21/2015  . ABNORMALITY OF GAIT 11/12/2009  . TARSAL TUNNEL SYNDROME, LEFT 10/08/2009  . PES PLANUS 10/08/2009    Past Medical History:  Past Medical History:  Diagnosis Date  . Asthma   . GERD (gastroesophageal reflux disease)   . Headache   . History of kidney stones    LEFT  URETERAL STONE  . HTN (hypertension)   . Pancreatitis 2018   GALLBALDDER SLUDGE CAUSED ISSUED RESOLVED   Past Surgical History:  Past Surgical History:  Procedure Laterality Date  . CANNULATION FOR ECMO (EXTRACORPOREAL MEMBRANE OXYGENATION) N/A 08/28/2019   Procedure: CANNULATION FOR VV ECMO (EXTRACORPOREAL MEMBRANE OXYGENATION);  Surgeon: Prescott Gum, Collier Salina, MD;  Location: [place];  Service: Open Heart Surgery;  Laterality: N/A;  CRESCENT CANNULA  . CANNULATION FOR ECMO (EXTRACORPOREAL MEMBRANE OXYGENATION) N/A 09/10/2019   Procedure: CANNULATION FOR ECMO (EXTRACORPOREAL MEMBRANE OXYGENATION) PUTTING IN CRESCENT 32FR CANNULA  AND REMOVING GROING CANNULATION;  Surgeon: Wonda Olds, MD;  Location: [place];  Service: Open Heart Surgery;  Laterality: N/A;  PUTTING IN CRESCENT/REMOVING GROIN CANNULATION  . CYSTOSCOPY/URETEROSCOPY/HOLMIUM LASER/STENT PLACEMENT Left 04/18/2019   Procedure: LEFT URETEROSCOPY/HOLMIUM LASER/STENT PLACEMENT;  Surgeon: Ardis Hughs, MD;  Location: [facility];  Service: Urology;  Laterality: Left;  . CYSTOSCOPY/URETEROSCOPY/HOLMIUM LASER/STENT PLACEMENT Left 05/02/2019   Procedure: CYSTOSCOPY/URETEROSCOPY/HOLMIUM LASER/STENT EXCHANGE;  Surgeon: Ardis Hughs, MD;  Location: [facility];  Service: Urology;  Laterality: Left;  . ECMO CANNULATION N/A 08/03/2019   Procedure: ECMO CANNULATION;  Surgeon: Wonda Olds, MD;  Location: [place] [facility];  Service: Cardiovascular;  Laterality: N/A;  . ESOPHAGOGASTRODUODENOSCOPY N/A 09/11/2019   Procedure: ESOPHAGOGASTRODUODENOSCOPY (EGD);  Surgeon: Wonda Olds, MD;  Location: [place];  Service: Thoracic;  Laterality: N/A;  . IR [place] Vivianne Master  10/14/2019  . IRRIGATION AND DEBRIDEMENT SHOULDER Right 09/29/2017   Procedure: IRRIGATION AND DEBRIDEMENT SHOULDER;  Surgeon: Erlinda Hong,  Marylynn Pearson, MD;  Location: [place];  Service: Orthopedics;  Laterality: Right;  . [place] SURGERY  2002  .  NASAL ENDOSCOPY WITH EPISTAXIS CONTROL N/A 08/31/2019   Procedure: NASAL ENDOSCOPY WITH EPISTAXIS CONTROL WITH CAUTERIZATION;  Surgeon: Melida Quitter, MD;  Location: [place];  Service: ENT;  Laterality: N/A;  . PORTACATH PLACEMENT N/A 09/11/2019   Procedure: PEG TUBE INSERTION - BEDSIDE;  Surgeon: Wonda Olds, MD;  Location: [place];  Service: Thoracic;  Laterality: N/A;  . TEE WITHOUT CARDIOVERSION N/A 08/28/2019   Procedure: TRANSESOPHAGEAL ECHOCARDIOGRAM (TEE);  Surgeon: Prescott Gum, Collier Salina, MD;  Location: [place];  Service: Open Heart Surgery;  Laterality: N/A;  . TEE WITHOUT CARDIOVERSION N/A 09/10/2019   Procedure: TRANSESOPHAGEAL ECHOCARDIOGRAM (TEE);  Surgeon: Wonda Olds, MD;  Location: [place];  Service: Open Heart Surgery;  Laterality: N/A;    Assessment & Plan Clinical Impression: 44 y/o M admitted 12/14 with COVID pneumonia causing acute hypoxemic respiratory failure.   Developed pneumomediastinum 12/23 with concern for possible bilateral small pneumothoraces on 12/24. Pt intubated on 12/26 and started on ECMO on 12/27. Tracheostomy on 12/29. Pt on ECMO until 3/4. Pt on vent until 3/14 and trach removed 4/2.  PMH - asthma, htn, obesity, rt shoulder I&D.   Patient currently requires total with basic self-care skills secondary to muscle weakness, decreased cardiorespiratoy endurance, impaired timing and sequencing, abnormal tone, unbalanced muscle activation and decreased coordination, decreased awareness, decreased problem solving and decreased memory and decreased sitting balance, decreased standing balance, decreased postural control and decreased balance strategies.  Prior to hospitalization, patient could complete BADl/IADL with independent .  Patient will benefit from skilled intervention to decrease level of assist with basic self-care skills and increase independence with basic self-care skillskkkk prior to discharge home with care partner.  Anticipate patient will require 24 hour  supervision, minimal physical assistance and moderate physical assestance and follow up home health.  OT - End of Session Endurance Deficit: Yes Endurance Deficit Description: Requires frequent rest breaks due to fatigue/pain OT Assessment Rehab Potential (ACUTE ONLY): Fair OT Barriers to Discharge: Inaccessible home environment;Decreased caregiver support;Weight OT Patient demonstrates impairments in the following area(s): Balance;Cognition;Edema;Endurance;Motor;Nutrition;Pain;Safety;Sensory;Skin Integrity OT Basic ADL's Functional Problem(s): Eating;Grooming;Bathing;Dressing;Toileting OT Transfers Functional Problem(s): Toilet;Tub/Shower OT Additional Impairment(s): Fuctional Use of Upper Extremity OT Plan OT Intensity: Minimum of 1-2 x/day, 45 to 90 minutes OT Frequency: Total of 15 hours over 7 days of combined therapies OT Treatment/Interventions: Balance/vestibular training;Discharge planning;Functional electrical stimulation;Pain management;Self Care/advanced ADL retraining;Therapeutic Activities;UE/LE Coordination activities;Therapeutic Exercise;Skin care/wound managment;Patient/family education;Functional mobility training;Disease mangement/prevention;Cognitive remediation/compensation;Community reintegration;DME/adaptive equipment instruction;Neuromuscular re-education;Psychosocial support;Splinting/orthotics;UE/LE Strength taining/ROM;Wheelchair propulsion/positioning OT Self Feeding Anticipated Outcome(s): S OT Basic Self-Care Anticipated Outcome(s): MIN A UB; MOD A LB OT Toileting Anticipated Outcome(s): MIN A bathing; MOD A toileting OT Bathroom Transfers Anticipated Outcome(s): MIN A OT Recommendation Follow Up Recommendations: Home health OT Equipment Recommended: 3 in 1 bedside comode;Tub/shower bench Equipment Details: [facility]   Skilled Therapeutic Intervention 1;1. Pt received in bed agreeable to OT with wife present. Edu re OT role/purpose, CIR, ELOS, POC and goals. Pt  overall total A +2 to for all mobility and BADL tasks. Pt with extremely limited activation in  BUE d/t weakness. Pt able to maintain sitting upright at EOB for ~15 min with block under feet for support while OT provides HOH A for UB BADL tasks. Discussed home set up, taking pictures and measurements with wife. Pt with 1 instance of rolling to L with MAX A of 1 caregiver  demoing good core activation. Exited session with pt seated in bed, exit alarm on and call light in reach  OT Evaluation Precautions/Restrictions  Precautions Precautions: Fall Precaution Comments: bilat wounds/feet, PEG Required Braces or Orthoses: Other Brace Other Brace: B prevalon boots; R wrist cock-up splint Restrictions Weight Bearing Restrictions: Yes Other Position/Activity Restrictions: to be clarified due to bilat wounds plantar and lateral aspect of feet General Chart Reviewed: Yes Family/Caregiver Present: Yes Vital Signs Therapy Vitals Temp: 98.1 F (36.7 C) Pulse Rate: 93 Resp: 20 BP: 123/62 Patient Position (if appropriate): Lying Oxygen Therapy SpO2: 97 % O2 Device: Nasal Cannula O2 Flow Rate (L/min): 2 L/min Pain Pain Assessment Pain Scale: 0-10 Pain Score: 5  Faces Pain Scale: Hurts whole lot Pain Type: Acute pain Pain Location: Back Pain Orientation: Right;Left Pain Radiating Towards: hips Pain Descriptors / Indicators: Aching;Sharp Pain Frequency: Constant Pain Onset: With Activity Pain Intervention(s): Medication (See eMAR) Home Living/Prior Functioning Home Living Available Help at Discharge: Available PRN/intermittently Type of Home: House Home Access: Stairs to enter [employer] of Steps: 6 per pt Entrance Stairs-Rails: Right, Left Home Layout: One level Bathroom Shower/Tub: Multimedia programmer: Standard Bathroom Accessibility: Yes Additional Comments: Home measurements will need to be collected to determine wc accessibility  Lives With: Spouse,  Son IADL History Homemaking Responsibilities: No Prior Function Level of Independence: Independent with basic ADLs, Independent with gait, Independent with homemaking with ambulation, Independent with transfers  Able to Take Stairs?: Yes Driving: Yes Vocation: Full time employment Vocation Requirements: Biochemist, clinical for [employer] Comments: Pt independent in ADLs, IADLs, and mobility. Pt drives and works at [employer]. Pt does not use oxygen at home. ADL ADL Eating: Dependent Grooming: Dependent Upper Body Bathing: Maximal assistance(MAX HOH A) Where Assessed-Upper Body Bathing: Edge of bed Lower Body Bathing: Dependent Where Assessed-Lower Body Bathing: Bed level Upper Body Dressing: Maximal assistance Where Assessed-Upper Body Dressing: Edge of bed Lower Body Dressing: Dependent(+2 to roll) Where Assessed-Lower Body Dressing: Bed level Toileting: Not assessed Toilet Transfer: Not assessed Vision Baseline Vision/History: No visual deficits Patient Visual Report: No change from baseline Vision Assessment?: No apparent visual deficits Perception  Perception: Within Functional Limits Praxis Praxis: Impaired Praxis Impairment Details: Motor planning Cognition Overall Cognitive Status: Impaired/Different from baseline Arousal/Alertness: Awake/alert Orientation Level: Person;Place;Situation Person: Oriented Place: Oriented Situation: Oriented Year: 2021 Month: April Day of Week: Incorrect Memory: Impaired Memory Impairment: Decreased recall of new information Immediate Memory Recall: Sock;Bed;Blue Memory Recall Sock: With Cue Memory Recall Blue: Without Cue Memory Recall Bed: Not able to recall Attention: Focused Focused Attention: Appears intact Awareness: Appears intact Problem Solving: Impaired Problem Solving Impairment: Functional complex Safety/Judgment: Appears intact Sensation Sensation Light Touch: Impaired by gross  assessment Proprioception: Impaired by gross assessment Coordination Gross Motor Movements are Fluid and Coordinated: No Fine Motor Movements are Fluid and Coordinated: No Coordination and Movement Description: impaired due to significant LE/UE ROM and strength deficits Finger Nose Finger Test: unable Heel Shin Test: unable Motor  Motor Motor - Skilled Clinical Observations: profound strength deficits throughout UE deficits greater than LE Mobility  Bed Mobility Rolling Right: Maximal Assistance - Patient 25-49% Rolling Left: Maximal Assistance - Patient 25-49% Right Sidelying to Sit: 2 Helpers Left Sidelying to Sit: 2 Helpers Sit to Sidelying Left: 2 Helpers Scooting to [facility]: 2 Helpers  Trunk/Postural Assessment  Cervical Assessment Cervical Assessment: Exceptions to WFL(generalized decreased ROM d/t immobility) Thoracic Assessment Thoracic Assessment: Exceptions to WFL(see above) Lumbar Assessment Lumbar Assessment: Exceptions to  WFL(see above) Postural Control Postural Control: Deficits on evaluation Postural Limitations: pain experienced w/mobility due to ROM limitations/immobility  Balance Balance Balance Assessed: Yes Dynamic Sitting Balance Sitting balance - Comments: able to sit on edge of bed w/feet supported w/Sand no UE support, requires assist w/sitting balance w/transfers due to weakness, sits unsupported in wc without difficulty Extremity/Trunk Assessment RUE Assessment RUE Assessment: Exceptions to [facility] General Strength Comments: trace shoulder ROM, pain during PROM indicative of impingement, 2-/5 at elbow, trace wrist/digit flex/ext LUE Assessment LUE Assessment: Exceptions to [name] General Strength Comments: trace shoulder ROM, pain during PROM indicative of impingement, 2/5 at elbow, 3/5  wrist 2/5 gross digit flex/ext     Refer to Care Plan for Long Term Goals  Recommendations for other services: Neuropsych   Discharge Criteria: Patient will be  discharged from OT if patient refuses treatment 3 consecutive times without medical reason, if treatment goals not met, if there is a change in medical status, if patient makes no progress towards goals or if patient is discharged from hospital.  The above assessment, treatment plan, treatment alternatives and goals were discussed and mutually agreed upon: by patient  Tonny Branch 11/15/2019, 4:26 PM

## 2019-11-15 NOTE — Progress Notes (Signed)
Tube feeding not started at 1800 due to dinner started at 1830. Want to allow pt to eat dinner first. Takes approx 1.0 to assist with meals

## 2019-11-15 NOTE — Progress Notes (Signed)
Inpatient Rehabilitation  Patient information reviewed and entered into eRehab system by Karmon Andis M. Nautica Hotz, M.A., CCC/SLP, PPS Coordinator.  Information including medical coding, functional ability and quality indicators will be reviewed and updated through discharge.    

## 2019-11-15 NOTE — Progress Notes (Addendum)
Advanced Heart Failure Rounding Note  PCP-Cardiologist: No primary care provider on file.   Subjective:    Admitted to CIR on 4/8   Feeling ok. Denies SOB.     Objective:   Weight Range: 119.3 kg Body mass index is 39.99 kg/m.   Vital Signs:   Temp:  [98.1 F (36.7 C)-98.4 F (36.9 C)] 98.1 F (36.7 C) (04/09 1356) Pulse Rate:  [77-97] 93 (04/09 1356) Resp:  [18-20] 20 (04/09 1356) BP: (120-130)/(62-78) 123/62 (04/09 1356) SpO2:  [95 %-97 %] 97 % (04/09 1356) Weight:  [119.3 kg] 119.3 kg (04/09 0350) Last BM Date: 11/14/19  Weight change: Filed Weights   11/15/19 0350  Weight: 119.3 kg    Intake/Output:   Intake/Output Summary (Last 24 hours) at 11/15/2019 1654 Last data filed at 11/15/2019 1410 Gross per 24 hour  Intake 1000 ml  Output 2600 ml  Net -1600 ml      Physical Exam    General:  In bed. No resp difficulty HEENT: Trach site with dressing in place. anicteric Neck: Supple. JVP 5-6 . Carotids 2+ bilat; no bruits. No lymphadenopathy or thyromegaly appreciated. Sutures at cannula site removed today Cor: PMI nondisplaced. Regular rate & rhythm. No rubs, gallops or murmurs. Lungs: Clear No wheeze  Abdomen: Obese Soft, nontender, nondistended. No hepatosplenomegaly. No bruits or masses. Good bowel sounds. Extremities: No cyanosis, clubbing, rash, edema. R and LLE with prevalon boots.  Neuro: Alert & orientedx3, cranial nerves grossly intact.  Weak in all 4 ext R>L Affect pleasant   Telemetry   N/A   EKG    N/A   Labs    CBC Recent Labs    11/13/19 1040 11/15/19 0457  WBC 7.4 6.8  NEUTROABS  --  4.1  HGB 12.4* 11.9*  HCT 40.6 38.2*  MCV 89.4 88.2  PLT 181 811   Basic Metabolic Panel Recent Labs    11/13/19 1040 11/15/19 0457  NA 138 137  K 4.5 4.1  CL 98 95*  CO2 29 29  GLUCOSE 139* 131*  BUN 46* 46*  CREATININE 0.76 0.77  CALCIUM 9.6 9.7  MG 1.7  --   PHOS 5.4*  --    Liver Function Tests Recent Labs     11/15/19 0457  AST 14*  ALT 14  ALKPHOS 74  BILITOT 0.2*  PROT 7.4  ALBUMIN 2.8*   No results for input(s): LIPASE, AMYLASE in the last 72 hours. Cardiac Enzymes No results for input(s): CKTOTAL, CKMB, CKMBINDEX, TROPONINI in the last 72 hours.  BNP: BNP (last 3 results) Recent Labs    07/22/19 1039  BNP 15.3    ProBNP (last 3 results) No results for input(s): PROBNP in the last 8760 hours.   D-Dimer No results for input(s): DDIMER in the last 72 hours. Hemoglobin A1C No results for input(s): HGBA1C in the last 72 hours. Fasting Lipid Panel No results for input(s): CHOL, HDL, LDLCALC, TRIG, CHOLHDL, LDLDIRECT in the last 72 hours. Thyroid Function Tests No results for input(s): TSH, T4TOTAL, T3FREE, THYROIDAB in the last 72 hours.  Invalid input(s): FREET3  Other results:   Imaging     No results found.   Medications:     Scheduled Medications: . amLODipine  10 mg Oral Daily  . vitamin C  500 mg Oral Daily  . carvedilol  25 mg Oral BID WC  . chlorhexidine  15 mL Mouth Rinse BID  . clonazepam  1 mg Oral QHS  . enoxaparin (  LOVENOX) injection  60 mg Subcutaneous Q24H  . escitalopram  20 mg Oral Daily  . feeding supplement (ENSURE ENLIVE)  237 mL Oral TID BM  . feeding supplement (OSMOLITE 1.5 CAL)  1,000 mL Per Tube Q24H  . feeding supplement (PRO-STAT SUGAR FREE 64)  60 mL Per Tube QID  . Gerhardt's butt cream   Topical Daily  . insulin aspart  0-15 Units Subcutaneous Q6H  . loratadine  10 mg Oral Daily  . melatonin  6 mg Oral QHS  . montelukast  10 mg Oral QHS  . nutrition supplement (JUVEN)  1 packet Per Tube BID BM  . pantoprazole  40 mg Oral Daily  . polycarbophil  625 mg Oral Daily  . psyllium  1 packet Oral TID  . spironolactone  25 mg Oral Daily  . tamsulosin  0.4 mg Oral QPC supper  . traZODone  100 mg Oral QHS     Infusions:   PRN Medications:  acetaminophen, albuterol, calcium carbonate, diazepam, HYDROmorphone,  ipratropium-albuterol, loperamide HCl, sodium chloride, sorbitol    Assessment/Plan   1.H/O Acute hypoxic/hypercarbic respiratory failure due to COVID PNA: Remained markedly hypoxic despite full support. Intubated 12/26. S/p bilateral CTs 12/26 for pneumothoraces.  TEE on 12/26 LVEF 70% RV ok. VV ECMO begun 12/26. Tracheostomy 12/29.  COVID ARDS at this point. ECMO circuit changed 1/8 & 1/23. [place] cannula placed 2/2 (femoral cannuals removed) - ECMO decannulated 3/4 - Trach decannulated 11/08/19 - Stable on room air.   2. COVID PNA: management of COVID infection per CCM. CXR with bilateral multifocal PNA progressing to likely ARDS.  - s/p 10 days of remdesivir - 12/16, 12/2 s/p tociluzimab - 12/17 s/p convalescent plasma - Decadron completed 1/11 - Resolved  3. FEN - Now on D3 diet. - Getting nocturnal tube feeds via PEG - Continue PPI.  - no change   4. Hypernatremia -Resolved.   5. DTIs - On both feet.  Continue off load.   6.  Hypokelmia  K 4.1  7.  Diffuse muscle weakness. CIR appreciated  PT/OT/Speech working with him .   8. HTN - Stable and controlled   10. Urinary retention - Improved. Continue Flomax  HF Team will from a distance. Please call if needed.  Length of Stay: 1  Amy Clegg, NP  11/15/2019, 4:54 PM  Advanced Heart Failure Team Pager [phone] (M-F; 7a - 4p)  Please contact [facility] for night-coverage after hours (4p -7a ) and weekends on [email or web address]   Patient seen and examined with the above-signed Advanced Practice Provider and/or Housestaff. I personally reviewed laboratory data, imaging studies and relevant notes. I independently examined the patient and formulated the important aspects of the plan. I have edited the note to reflect any of my changes or salient points. I have personally discussed the plan with the patient and/or family.  Remains weak but working with CIR team. Volume status and vitals look good.   I removed  sutures from RIJ site.   Exam stable as above.   HF team will follow at a distance. Please call with questions.   Glori Bickers, MD  5:26 PM

## 2019-11-15 NOTE — Progress Notes (Signed)
Sutures removed today by MD, wife at bedside. Encouraged hand over hand feeding.

## 2019-11-15 NOTE — Evaluation (Signed)
Speech Language Pathology Assessment and Plan  Patient Details  Name: [name] MRN: [id number] Date of Birth: 1975-10-10  SLP Diagnosis: Cognitive Impairments;Dysphagia  Rehab Potential: Excellent ELOS: 4 weeks but may be shorter for SLP    Today's Date: 11/15/2019 SLP Individual Time: 2409-7353 SLP Individual Time Calculation (min): 55 min   Problem List:  Patient Active Problem List   Diagnosis Date Noted  . 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  . History of COVID-19   . ARDS (adult respiratory distress syndrome) ([place])   . Pressure injury of skin 08/22/2019  . Need for emotional support   . Chest tube in place   . Personal history of ECMO   . Advanced care planning/counseling discussion   . Acute respiratory distress syndrome (ARDS) due to COVID-19 virus ([place])   . Palliative care by specialist   . Goals of care, counseling/discussion   . Advanced directives, counseling/discussion   . Subcutaneous crepitus   . Pneumonia due to COVID-19 virus 07/23/2019  . Acute respiratory failure ([place]) 07/22/2019  . Persistent asthma with undetermined severity   . Thrombocytopenia ([place])   . Leukopenia   . Fever   . Gram positive bacterial infection   . Septic arthritis of right acromioclavicular joint ([place]) 09/29/2017  . Chronic left-sided low back pain with left-sided sciatica 09/19/2017  . Epigastric pain   . Pancreatitis 04/17/2017  . Essential hypertension 04/17/2017  . Moderate persistent asthma 07/21/2015  . Allergic rhinoconjunctivitis 07/21/2015  . GERD (gastroesophageal reflux disease) 07/21/2015  . ABNORMALITY OF GAIT 11/12/2009  . TARSAL TUNNEL SYNDROME, LEFT 10/08/2009  . PES PLANUS 10/08/2009   Past Medical History:  Past Medical History:  Diagnosis Date  . Asthma   . GERD (gastroesophageal reflux disease)   . Headache   . History of kidney stones    LEFT URETERAL STONE  . HTN (hypertension)   . Pancreatitis 2018   GALLBALDDER SLUDGE CAUSED ISSUED  RESOLVED   Past Surgical History:  Past Surgical History:  Procedure Laterality Date  . CANNULATION FOR ECMO (EXTRACORPOREAL MEMBRANE OXYGENATION) N/A 08/28/2019   Procedure: CANNULATION FOR VV ECMO (EXTRACORPOREAL MEMBRANE OXYGENATION);  Surgeon: Prescott Gum, Collier Salina, MD;  Location: [place];  Service: Open Heart Surgery;  Laterality: N/A;  CRESCENT CANNULA  . CANNULATION FOR ECMO (EXTRACORPOREAL MEMBRANE OXYGENATION) N/A 09/10/2019   Procedure: CANNULATION FOR ECMO (EXTRACORPOREAL MEMBRANE OXYGENATION) PUTTING IN CRESCENT 32FR CANNULA  AND REMOVING GROING CANNULATION;  Surgeon: Wonda Olds, MD;  Location: [place];  Service: Open Heart Surgery;  Laterality: N/A;  PUTTING IN CRESCENT/REMOVING GROIN CANNULATION  . CYSTOSCOPY/URETEROSCOPY/HOLMIUM LASER/STENT PLACEMENT Left 04/18/2019   Procedure: LEFT URETEROSCOPY/HOLMIUM LASER/STENT PLACEMENT;  Surgeon: Ardis Hughs, MD;  Location[facility];  Service: Urology;  Laterality: Left;  . CYSTOSCOPY/URETEROSCOPY/HOLMIUM LASER/STENT PLACEMENT Left 05/02/2019   Procedure: CYSTOSCOPY/URETEROSCOPY/HOLMIUM LASER/STENT EXCHANGE;  Surgeon: Ardis Hughs, MD;  Location: [facility];  Service: Urology;  Laterality: Left;  . ECMO CANNULATION N/A 08/03/2019   Procedure: ECMO CANNULATION;  Surgeon: Wonda Olds, MD;  Location: [place] [facility];  Service: Cardiovascular;  Laterality: N/A;  . ESOPHAGOGASTRODUODENOSCOPY N/A 09/11/2019   Procedure: ESOPHAGOGASTRODUODENOSCOPY (EGD);  Surgeon: Wonda Olds, MD;  Location: [place];  Service: Thoracic;  Laterality: N/A;  . IR [place] Vivianne Master  10/14/2019  . IRRIGATION AND DEBRIDEMENT SHOULDER Right 09/29/2017   Procedure: IRRIGATION AND DEBRIDEMENT SHOULDER;  Surgeon: Leandrew Koyanagi, MD;  Location: [place];  Service: Orthopedics;  Laterality: Right;  . LUMBAR  [place] SURGERY  2002  . NASAL ENDOSCOPY WITH EPISTAXIS CONTROL N/A 08/31/2019   Procedure: NASAL ENDOSCOPY WITH EPISTAXIS  CONTROL WITH CAUTERIZATION;  Surgeon: Melida Quitter, MD;  Location: [place];  Service: ENT;  Laterality: N/A;  . PORTACATH PLACEMENT N/A 09/11/2019   Procedure: PEG TUBE INSERTION - BEDSIDE;  Surgeon: Wonda Olds, MD;  Location: [place];  Service: Thoracic;  Laterality: N/A;  . TEE WITHOUT CARDIOVERSION N/A 08/28/2019   Procedure: TRANSESOPHAGEAL ECHOCARDIOGRAM (TEE);  Surgeon: Prescott Gum, Collier Salina, MD;  Location: [place];  Service: Open Heart Surgery;  Laterality: N/A;  . TEE WITHOUT CARDIOVERSION N/A 09/10/2019   Procedure: TRANSESOPHAGEAL ECHOCARDIOGRAM (TEE);  Surgeon: Wonda Olds, MD;  Location: [place];  Service: Open Heart Surgery;  Laterality: N/A;    Assessment / Plan / Recommendation Clinical Impression Patient is a 44 year old right-handed male with history of asthma, hypertension and pancreatitis.  Per chart review patient lives with spouse independent prior to admission.  1 level home with 4 steps to entry.  Initially presented 07/22/2019 with Covid pneumonia causing acute respiratory failure.  He developed pneumomediastinum 12/23 with concern for possible bilateral small pneumothoraces 08/01/2019.  Patient initially placed on 10-day course of Decadron as well as REMDESIVIR and TOCILUZIMAB.  Patient required intubation and later underwent bilateral chest tube placements.  TEE 08/03/2019 ejection fraction 70% without emboli.  Long-term intubation required tracheostomy completed 08/06/2019 that has been downsized and decannulated 11/08/2019.  As well as PEG tube placement 09/11/2019 per Dr. Worthy Keeler.  He did require initial long-term broad-spectrum antibiotics.  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  Palliative care continues to follow to establish goals of care.  His diet has been advanced to regular thin liquids.  He was still receiving some nocturnal tube feeds for nutritional support.  Subcutaneous Lovenox for  DVT prophylaxis.  Patient with deep tissue pressure injuries to bilateral heels and lateral feet followed by WOC with dressing changes as directed.  Therapy evaluations completed and patient was admitted for a comprehensive rehab program 11/14/19.  Patient demonstrates mild higher-level cognitive impairments impacting complex problem solving and recall of new, daily information. Patient consumed thin liquids via straw without overt s/s of aspiration and demonstrated what appeared to be a timely swallow initiation. Patient demonstrated efficient mastication with complete oral clearance of solid textures with intermittent and subtle throat clear X 2. Suspect due to residue in which patient independently utilized liquid washes to help clear. Recommend patient continue current diet. Patient would benefit from skilled SLP intervention to maximize his swallowing and cognitive functioning and overall functional independence prior to discharge.    Skilled Therapeutic Interventions          Administered a cognitive-linguistic evaluation and BSE, please see above for details.   SLP Assessment  Patient will need skilled Speech Lanaguage Pathology Services during CIR admission    Recommendations  SLP Diet Recommendations: Age appropriate regular solids;Thin Liquid Administration via: Straw Medication Administration: Whole meds with liquid Supervision: Trained caregiver to feed patient;Staff to assist with self feeding Compensations: Minimize environmental distractions;Slow rate;Small sips/bites Postural Changes and/or Swallow Maneuvers: Seated upright 90 degrees;Upright 30-60 min after meal Oral Care Recommendations: Oral care BID Recommendations for Other Services: Neuropsych consult Patient destination: Home Follow up Recommendations: (TBD)    SLP Frequency 3 to 5 out of 7 days   SLP Duration  SLP Intensity  SLP Treatment/Interventions 4 weeks but may be shorter for SLP  Minumum  of 1-2 x/day, 30 to 90  minutes  Cognitive remediation/compensation;Dysphagia/aspiration precaution training;Internal/external aids;Therapeutic Activities;Environmental controls;Cueing hierarchy;Functional tasks;Patient/family education    Pain Pain Assessment Pain Scale: 0-10 Pain Score: 4  Pain Type: Acute pain Pain Location: lhips Pain Orientation: Right;Left Pain Descriptors / Indicators: Aching Pain Frequency: Constant Pain Onset: With Activity Pain Intervention(s): Medication (See eMAR)  Prior Functioning Type of Home: House  Lives With: Spouse;Son Available Help at Discharge: Available PRN/intermittently Vocation: Full time employment  SLP Evaluation Cognition Overall Cognitive Status: Impaired/Different from baseline Arousal/Alertness: Awake/alert Orientation Level: Oriented X4 Attention: Focused Focused Attention: Appears intact Memory: Impaired Memory Impairment: Decreased recall of new information Awareness: Appears intact Problem Solving: Impaired Problem Solving Impairment: Functional complex Safety/Judgment: Appears intact  Comprehension Auditory Comprehension Overall Auditory Comprehension: Appears within functional limits for tasks assessed Visual Recognition/Discrimination Discrimination: Within Function Limits Reading Comprehension Reading Status: Not tested Expression Expression Primary Mode of Expression: Verbal Verbal Expression Overall Verbal Expression: Appears within functional limits for tasks assessed Written Expression Dominant Hand: Right Written Expression: Not tested Oral Motor Oral Motor/Sensory Function Overall Oral Motor/Sensory Function: Within functional limits Motor Speech Overall Motor Speech: Appears within functional limits for tasks assessed  Bedside Swallowing Assessment General Date of Onset: 08/05/20 Previous Swallow Assessment: MBS 4/7: Recommended Dys. 3 textures with thin liquids but has since been upgraded to regular textures Diet  Prior to this Study: Regular;Thin liquids Temperature Spikes Noted: No Respiratory Status: Supplemental O2 delivered via (comment) Behavior/Cognition: Alert;Cooperative Oral Cavity - Dentition: Adequate natural dentition Self-Feeding Abilities: Total assist Vision: Functional for self-feeding Patient Positioning: Upright in bed Baseline Vocal Quality: Normal Volitional Cough: Strong Volitional Swallow: Able to elicit  Ice Chips Ice chips: Not tested Thin Liquid Thin Liquid: Within functional limits Nectar Thick Nectar Thick Liquid: Not tested Honey Thick Honey Thick Liquid: Not tested Puree Puree: Within functional limits Solid Solid: Impaired Pharyngeal Phase Impairments: Throat Clearing - Delayed Other Comments: subtle BSE Assessment Risk for Aspiration Impact on safety and function: Mild aspiration risk Other Related Risk Factors: Deconditioning  Short Term Goals: Week 1: SLP Short Term Goal 1 (Week 1): Patient will consume current diet with minimal overt s/s of aspiration with Mod I for use of swallowing compensatory strategies. SLP Short Term Goal 2 (Week 1): Patient will demonstrate complex problem solving for functional tasks with supervision verbal cues. SLP Short Term Goal 3 (Week 1): Patient will recall new, daily information with supervision verbal cues.  Refer to Care Plan for Long Term Goals  Recommendations for other services: Neuropsych  Discharge Criteria: Patient will be discharged from SLP if patient refuses treatment 3 consecutive times without medical reason, if treatment goals not met, if there is a change in medical status, if patient makes no progress towards goals or if patient is discharged from hospital.  The above assessment, treatment plan, treatment alternatives and goals were discussed and mutually agreed upon: by patient  ,  11/15/2019, 2:56 PM  

## 2019-11-15 NOTE — Progress Notes (Signed)
Chaplain responded to Spiritual Consult for delivery of AD to patient.  After introductions, Chaplain offered AD to [name], who denied needing/wanting one.  He reported his wife was the one to speak for him and his health care needs should he not be able to.  He did say it was ok to leave a blank AD and he would have his wife look over it.  [name] denied needing any further assistance from [place].  De Burrs Chaplain Resident

## 2019-11-15 NOTE — Progress Notes (Signed)
Initial Nutrition Assessment  DOCUMENTATION CODES:   Obesity unspecified  INTERVENTION:  -Calorie Count per MD. RD will follow up with results on Monday -Continue Ensure Enlive po TID, each supplement provides 350 kcal and 20 grams of protein -Feeding assistance with each meal and supplement  Continue nocturnal tube feeding:  -Osmolite 1.5 @ 75 ml/hr via PEG x 12 hrs (1800-0600) -60 ml Prostat QID -Juven BID   At goal TF provides: 2310 kcal, 181 grams protein, and 686 ml free water. Meets 76% kcal needs and 100% of protein needs.   Pt with increased nutrient needs to aid in wound healing. It will be difficult for pt to receive enough po to meet these needs, especially considering his need for feeding assistance. RD would not recommend discontinuing nocturnal tube feeding at this time.   NUTRITION DIAGNOSIS:   Increased nutrient needs related to wound healing as evidenced by estimated needs.    GOAL:   Patient will meet greater than or equal to 90% of their needs    MONITOR:   PO intake, Supplement acceptance, TF tolerance, Labs, I & O's  REASON FOR ASSESSMENT:   Consult Enteral/tube feeding initiation and management, Calorie Count  ASSESSMENT:   Pt with a PMH significant for asthma, HTN and pancreatitis. Pt initially presented 07/22/2019 with Covid PNA causing acute respiratory failure. Pt developed pneumomediastinum 12/23 with concern for possible bilateral small pneumothoraces 08/01/2019. Pt required intubation and later underwent bilateral chest tube placements.  TEE 08/03/2019 ejection fraction 70% without emboli.  Long-term intubation required tracheostomy completed 08/06/2019 that has been downsized and decannulated 11/08/2019. PEG tube placement 09/11/2019. 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 Diet has been advanced to regular thin liquids; pt still receiving some nocturnal tube  feeds for nutritional support. Admitted to CIR on 4/8.  RD consulted for calorie count to see if nocturnal feeds can be stopped. Of note, pt's calorie and protein needs are still significantly elevated due to wound healing and it will be difficult for the patient to consume enough PO to help meet needs. RD will order calorie count and will follow-up on Monday.   Pt reports good appetite. RD explained increased needs to pt and hesitation to discontinue tube feeding due to these increased needs; pt is agreeable to continuing nocturnal feeds.   PO Intake: 75-100% x2 recorded meals   Current tube feeding regimen: Osmolite 1.5 cal @ 14m/hr via PEG from 1800-0600, 644mPro-stat QID  UOP: 160062m24 hours I/O: -1300m71mnce admit  Medications reviewed and include: Vitamin C, Ensure Enlive TID, Novolog, Juven BID, Fibercon, Metamucil, Aldactone  Labs reviewed: CBGs 120-115-117   NUTRITION - FOCUSED PHYSICAL EXAM:    Most Recent Value  Orbital Region  No depletion  Upper Arm Region  No depletion  Thoracic and Lumbar Region  No depletion  Buccal Region  No depletion  Temple Region  No depletion  Clavicle Bone Region  No depletion  Clavicle and Acromion Bone Region  No depletion  Scapular Bone Region  No depletion  Dorsal Hand  No depletion  Patellar Region  No depletion  Anterior Thigh Region  No depletion  Posterior Calf Region  No depletion  Edema (RD Assessment)  Mild  Hair  Reviewed  Eyes  Reviewed  Mouth  Reviewed  Skin  Reviewed  Nails  Reviewed       Diet Order:   Diet Order  Diet regular Room service appropriate? Yes with Assist; Fluid consistency: Thin  Diet effective now              EDUCATION NEEDS:   No education needs have been identified at this time  Skin:  Skin Integrity Issues:: DTI, Unstageable DTI: bilateral heels Unstageable: L pinky toe  Last BM:  4/8  Height:   Ht Readings from Last 1 Encounters:  08/09/19 5' 8"  (1.727 m)     Weight:   Wt Readings from Last 1 Encounters:  11/15/19 119.3 kg    BMI:  Body mass index is 39.99 kg/m.  Estimated Nutritional Needs:   Kcal:  8366-2947 (25-30 kcal/kg)  Protein:  180-215 grams  Fluid:  >/= 2.5 L/day   Larkin Ina, MS, RD, LDN RD pager number and weekend/on-call pager number located in [place].

## 2019-11-15 NOTE — IPOC Note (Signed)
Overall Plan of Care [facility]) Patient Details Name: [name] MRN: [id number] DOB: 22-Jul-1976  Admitting Diagnosis: Critical illness myopathy  Hospital Problems: Principal Problem:   Critical illness myopathy     Functional Problem List: Nursing Endurance, Skin Integrity, Medication Management, Nutrition  PT Balance, Behavior, Endurance, Motor, Pain, Safety, Sensory, Skin Integrity  OT Balance, Cognition, Edema, Endurance, Motor, Nutrition, Pain, Safety, Sensory, Skin Integrity  SLP    TR         Basic ADL's: OT Eating, Grooming, Bathing, Dressing, Toileting     Advanced  ADL's: OT       Transfers: PT Bed Mobility, Bed to Chair, Car, Manufacturing systems engineer, Metallurgist: PT Ambulation, Emergency planning/management officer, Stairs     Additional Impairments: OT Fuctional Use of Upper Extremity  SLP Swallowing, Social Cognition   Memory, Problem Solving  TR      Anticipated Outcomes Item Anticipated Outcome  Self Feeding S  Swallowing  Mod I   Basic self-care  MIN A UB; MOD A LB  Toileting  MIN A bathing; MOD A toileting   Bathroom Transfers MIN A  Bowel/Bladder  to remain continent x 2 LBM 11/14/2119  Transfers  min assist  Locomotion  mod assist  Communication     Cognition  Mod I  Pain  less than 2 out of 10  Safety/Judgment  to remain fall free while in rehab   Therapy Plan: PT Intensity: Minimum of 1-2 x/day ,45 to 90 minutes PT Frequency: 5 out of 7 days PT Duration Estimated Length of Stay: 4 weeks OT Intensity: Minimum of 1-2 x/day, 45 to 90 minutes OT Frequency: Total of 15 hours over 7 days of combined therapies OT Duration/Estimated Length of Stay: 4-5 weeks SLP Intensity: Minumum of 1-2 x/day, 30 to 90 minutes SLP Frequency: 3 to 5 out of 7 days SLP Duration/Estimated Length of Stay: 4 weeks but may be shorter for SLP   Due to the current state of emergency, patients may not be receiving their 3-hours of Medicare-mandated  therapy.   Team Interventions: Nursing Interventions Patient/Family Education, Disease Management/Prevention, Skin Care/Wound Management, Discharge Planning, Medication Management  PT interventions Ambulation/gait training, Discharge planning, Functional mobility training, Psychosocial support, Therapeutic Activities, Balance/vestibular training, Disease management/prevention, Neuromuscular re-education, Skin care/wound management, Therapeutic Exercise, Wheelchair propulsion/positioning, DME/adaptive equipment instruction, Pain management, Splinting/orthotics, UE/LE Strength taining/ROM, Patient/family education, Stair training, UE/LE Coordination activities  OT Interventions Balance/vestibular training, Discharge planning, Functional electrical stimulation, Pain management, Self Care/advanced ADL retraining, Therapeutic Activities, UE/LE Coordination activities, Therapeutic Exercise, Skin care/wound managment, Patient/family education, Functional mobility training, Disease mangement/prevention, Cognitive remediation/compensation, Community reintegration, Engineer, drilling, Neuromuscular re-education, Psychosocial support, Splinting/orthotics, UE/LE Strength taining/ROM, Wheelchair propulsion/positioning  SLP Interventions Cognitive remediation/compensation, Dysphagia/aspiration precaution training, Internal/external aids, Therapeutic Activities, Environmental controls, Cueing hierarchy, Functional tasks, Patient/family education  TR Interventions    SW/CM Interventions Discharge Planning, Psychosocial Support, Patient/Family Education   Barriers to Discharge MD  Medical stability  Nursing Wound Care, New oxygen    PT Inaccessible home environment, Decreased caregiver support, Home environment access/layout, Weight, Weight bearing restrictions, New oxygen Pt w/somewhat flat affect, recommend further assessment  OT Inaccessible home environment, Decreased caregiver support, Weight     SLP      SW       Team Discharge Planning: Destination: PT-Home ,OT-   , SLP-Home Projected Follow-up: PT-Home health PT, OT-  Home health OT, SLP-(TBD) Projected Equipment Needs: PT-To be determined, OT- 3 in 1 bedside comode,  Tub/shower bench, SLP-  Equipment Details: PT- , OT-DAC Patient/family involved in discharge planning: PT- Patient,  OT-Family member/caregiver, SLP-Patient  MD ELOS: [id number] Medical Rehab Prognosis:  Good Assessment: The patient has been admitted for CIR therapies with the diagnosis of critical illness myopathy on sustained ECMO, s/p COVID respiratory failure. The team will be addressing functional mobility, strength, stamina, balance, safety, adaptive techniques and equipment, self-care, bowel and bladder mgt, patient and caregiver education, speech, swallowing, communication. Goals have been set at min to [employer] with self-care, mobility. Mod I with cognition and communication  Due to the current state of emergency, patients may not be receiving their 3 hours per day of Medicare-mandated therapy.    Meredith Staggers, MD, FAAPMR      See Team Conference Notes for weekly updates to the plan of care

## 2019-11-15 NOTE — Progress Notes (Signed)
[place] PHYSICAL MEDICINE & REHABILITATION PROGRESS NOTE   Subjective/Complaints:  Pt reports PEG and O2 are bothering him- had so long- wants them out.   Starting breakfast as I left- with help of CNA.   ROS:  Pt denies SOB, abd pain, CP, N/V/C/D, and vision changes   Objective:   No results found. Recent Labs    11/13/19 1040 11/15/19 0457  WBC 7.4 6.8  HGB 12.4* 11.9*  HCT 40.6 38.2*  PLT 181 174   Recent Labs    11/13/19 1040 11/15/19 0457  NA 138 137  K 4.5 4.1  CL 98 95*  CO2 29 29  GLUCOSE 139* 131*  BUN 46* 46*  CREATININE 0.76 0.77  CALCIUM 9.6 9.7    Intake/Output Summary (Last 24 hours) at 11/15/2019 1031 Last data filed at 11/15/2019 0900 Gross per 24 hour  Intake 900 ml  Output 2200 ml  Net -1300 ml     Physical Exam: Vital Signs Blood pressure 130/78, pulse 97, temperature 98.4 F (36.9 C), temperature source Oral, resp. rate 18, weight 119.3 kg, SpO2 97 %.  Constitutional:pt awake,laying in bed; supine; wearing O2 by [place]- 2L, NAD HENT:  Conjugate gaze;  Cortrak in nares and O2 by [place].  Cardiovascular: RRR- no JVD Respiratory: CTA B?l except a little coarse- more upper airway sounds, good air movement  GI: (+) PEG- soft, NT, ND, (+)BS Musculoskeletal:     Cervical back: Normal range of motion and neck supple.     Comments: RUE- delt 2-/5, biceps 4-/5, tricep 3/5, WE 3/5, grip 3/5, finger 2/5 LUE- delt 2-/5, biceps 2/5, triceps 3/5, WE 1/5, grip 2+/5, finger 1/5 LEs B/L- HF 2-/5, KE and KF 2/5, DF 1/5; PF 2/5  Neurological: Ox3 Pt reports sensation decreased in L thumb and RUE; normal in LEs to light touch No hoffman's or clonus B/L  Skin:  Has B/L anterior calf scrapes of skin- covered with dressings B/L- superficial scrapes/no sign of infection.  No skin breakdown anywhere else seen- bottom and heels  Psychiatric:  Very anxious- beating feet, hands like a tremor, but can stop it   Assessment/Plan: 1. Functional deficits  secondary to ICU myopathy which require 3+ hours per day of interdisciplinary therapy in a comprehensive inpatient rehab setting.  Physiatrist is providing close team supervision and 24 hour management of active medical problems listed below.  Physiatrist and rehab team continue to assess barriers to discharge/monitor patient progress toward functional and medical goals  Care Tool:  Bathing              Bathing assist       Upper Body Dressing/Undressing Upper body dressing        Upper body assist      Lower Body Dressing/Undressing Lower body dressing            Lower body assist       Toileting Toileting    Toileting assist Assist for toileting: Dependent - Patient 0%(urinal)     Transfers Chair/bed transfer  Transfers assist           Locomotion Ambulation   Ambulation assist              Walk 10 feet activity   Assist           Walk 50 feet activity   Assist           Walk 150 feet activity   Assist  Walk 10 feet on uneven surface  activity   Assist           Wheelchair     Assist               Wheelchair 50 feet with 2 turns activity    Assist            Wheelchair 150 feet activity     Assist          Blood pressure 130/78, pulse 97, temperature 98.4 F (36.9 C), temperature source Oral, resp. rate 18, weight 119.3 kg, SpO2 97 %.  Medical Problem List and Plan: 1.  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  Antithrombotics: -DVT/anticoagulation: Lovenox 60 mg daily.  Vascular study negative             -antiplatelet therapy: N/A 3. Pain Management: Dilaudid 2 mg every 6 hours as needed pain 4. Mood: Melatonin 6 mg nightly, trazodone 100 mg nightly, Klonopin 1 mg nightly,  Lexapro 20 mg daily, Valium 2.5 mg every 12 hours as needed anxiety  4/9- will try to get Neuropsych for pt due ot anxiety.              -antipsychotic agents: N/A 5. Neuropsych: This patient is capable of making decisions on his own behalf. 6. Skin/Wound/ Care/deep tissue pressure injury bilateral heels and lateral feet: Wound care as directed 7. Fluids/Electrolytes/Nutrition: Strict INO's with follow-up chemistries  4/9- labs look good except BUN still 46- is dry? But is stable/better than was 1 week ago- was 51.  8.  Dysphagia.  Status post PEG tube 09/11/2019 per Dr. Worthy Keeler.  Diet has been advanced to regular.  Patient is still receiving nocturnal tube feeds by PEG tube.  4/9- doing calorie count, to see if can stop TFs 9.  Status post tracheostomy 08/06/2019.  Decannulated 11/08/2019. 10.  Hypertension.  Norvasc 10 mg daily, Coreg 25 mg twice daily, Aldactone 25 mg daily.  Monitor with increased mobility  4/9- BP well controlled- con't regimen 11.  History of asthma.  Continue nebulizers as directed 12.  GERD.  Protonix 13.  BPH.  Flomax 0.4 mg daily. 14. Morbid obesity- current BMI of 40- will discuss diet, etc once more appropriate.  15. Insomnia- con't meds as above  4/9 sleeps OK- is his normal amount of sleep    LOS: 1 days A FACE TO FACE EVALUATION WAS PERFORMED  [name] 11/15/2019, 10:31 AM

## 2019-11-16 ENCOUNTER — Inpatient Hospital Stay (HOSPITAL_COMMUNITY): Payer: Managed Care, Other (non HMO)

## 2019-11-16 ENCOUNTER — Inpatient Hospital Stay (HOSPITAL_COMMUNITY): Payer: Managed Care, Other (non HMO) | Admitting: Physical Therapy

## 2019-11-16 ENCOUNTER — Inpatient Hospital Stay (HOSPITAL_COMMUNITY): Payer: Managed Care, Other (non HMO) | Admitting: Speech Pathology

## 2019-11-16 LAB — GLUCOSE, CAPILLARY
Glucose-Capillary: 102 mg/dL — ABNORMAL HIGH (ref 70–99)
Glucose-Capillary: 143 mg/dL — ABNORMAL HIGH (ref 70–99)
Glucose-Capillary: 149 mg/dL — ABNORMAL HIGH (ref 70–99)
Glucose-Capillary: 93 mg/dL (ref 70–99)

## 2019-11-16 MED ORDER — CARBAMIDE PEROXIDE 6.5 % OT SOLN
5.0000 [drp] | Freq: Two times a day (BID) | OTIC | Status: DC
  Administered 2019-11-16 – 2019-11-24 (×8): 5 [drp] via OTIC
  Filled 2019-11-16: qty 15

## 2019-11-16 MED ORDER — KETOTIFEN FUMARATE 0.025 % OP SOLN
1.0000 [drp] | Freq: Two times a day (BID) | OPHTHALMIC | Status: DC
  Administered 2019-11-16 – 2019-11-24 (×8): 1 [drp] via OPHTHALMIC
  Filled 2019-11-16: qty 5

## 2019-11-16 MED ORDER — AMLODIPINE BESYLATE 5 MG PO TABS
5.0000 mg | ORAL_TABLET | Freq: Every day | ORAL | Status: DC
  Administered 2019-11-17 – 2019-11-22 (×6): 5 mg via ORAL
  Filled 2019-11-16 (×7): qty 1

## 2019-11-16 NOTE — Progress Notes (Signed)
Physical Therapy Session Note  Patient Details  Name: [name] MRN: [id number] Date of Birth: 09/01/1975  Today's Date: 11/16/2019 PT Individual Time: 0102-7253 PT Individual Time Calculation (min): 42 min   Short Term Goals: Week 1:  PT Short Term Goal 1 (Week 1): Pt will tolerate OOB x 2+ hrs PT Short Term Goal 2 (Week 1): Pt will verbalize steps/instruct therapist w/ set up for SBT PT Short Term Goal 3 (Week 1): Bed to/from wc w/max assist of 2 PT Short Term Goal 4 (Week 1): Pt will roll w/max assist of 1 and cues using bed features  Skilled Therapeutic Interventions/Progress Updates:  Pt was seen bedside in the pm, sitting up in w/c. Attempted to stand with +2 assist, pt able to clear buttocks off w/c. Pt transferred w/c to edge of bed with sliding board and +2 assist with verbal cues. Pt transferred edge of bed to supine with +2 assist. Pt moved up in bed with +2 assist. Treatment focused on B LEs there ex, 2 sets x 10 reps each, heel slides, hip abd/add, and SAQs. Pt left sitting up in bed with call bell within reach.   Therapy Documentation Precautions:  Precautions Precautions: Fall Precaution Comments: bilat wounds/feet, PEG Required Braces or Orthoses: Other Brace Other Brace: B prevalon boots; R wrist cock-up splint Restrictions Weight Bearing Restrictions: Yes RLE Weight Bearing: Non weight bearing LLE Weight Bearing: Non weight bearing Other Position/Activity Restrictions: to be clarified due to bilat wounds plantar and lateral aspect of feet General:   Vital Signs: Pain: Pt c/o R knee pain.   Therapy/Group: Individual Therapy  Dub Amis 11/16/2019, 3:31 PM

## 2019-11-16 NOTE — Progress Notes (Signed)
Spoke with Dr. Tarry Kos in person in regards to pts low B/P. New orders have been acknowledged to adjust his amlodipine medication. Mews score did not change.

## 2019-11-16 NOTE — Progress Notes (Addendum)
[place] PHYSICAL MEDICINE & REHABILITATION PROGRESS NOTE   Subjective/Complaints: Denies pain.  Moving bowels regularly. Urinating ok.  Net positive 253m  ROS: Pt denies SOB, abd pain, CP, N/V/C/D, and vision changes  Objective:   No results found. Recent Labs    11/15/19 0457  WBC 6.8  HGB 11.9*  HCT 38.2*  PLT 174   Recent Labs    11/15/19 0457  NA 137  K 4.1  CL 95*  CO2 29  GLUCOSE 131*  BUN 46*  CREATININE 0.77  CALCIUM 9.7    Intake/Output Summary (Last 24 hours) at 11/16/2019 1625 Last data filed at 11/16/2019 1400 Gross per 24 hour  Intake 2556.25 ml  Output 2350 ml  Net 206.25 ml     Physical Exam: Vital Signs Blood pressure 115/67, pulse 98, temperature 98.1 F (36.7 C), temperature source Oral, resp. rate 16, weight 121 kg, SpO2 91 %.  Constitutional: sitting up in WC in NAD HENT:  Conjugate gaze;  Cortrak in nares and O2 by [place].  Cardiovascular: RRR- no JVD Respiratory: CTA B?l except a little coarse- more upper airway sounds, good air movement  GI: (+) PEG- soft, NT, ND, (+)BS Musculoskeletal:     Cervical back: Normal range of motion and neck supple.     Comments: RUE- delt 2-/5, biceps 4-/5, tricep 3/5, WE 3/5, grip 3/5, finger 2/5 LUE- delt 2-/5, biceps 2/5, triceps 3/5, WE 1/5, grip 2+/5, finger 1/5 LEs B/L- HF 2-/5, KE and KF 2/5, DF 1/5; PF 2/5 Neurological: Ox3 Pt reports sensation decreased in L thumb and RUE; normal in LEs to light touch No hoffman's or clonus B/L  Skin:  Has B/L anterior calf scrapes of skin- covered with dressings B/L- superficial scrapes/no sign of infection.  No skin breakdown anywhere else seen- bottom and heels  Psychiatric:  Very anxious- beating feet, hands like a tremor, but can stop it  Assessment/Plan: 1. 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  Physiatrist is providing close team  supervision and 24 hour management of active medical problems listed below.  Physiatrist and rehab team continue to assess barriers to discharge/monitor patient progress toward functional and medical goals  Care Tool:  Bathing        Body parts bathed by helper: Right arm, Left arm, Chest, Abdomen, Front perineal area, Buttocks, Right upper leg, Left upper leg, Right lower leg, Left lower leg, Face     Bathing assist Assist Level: 2 Helpers     Upper Body Dressing/Undressing Upper body dressing   What is the patient wearing?: Pull over shirt    Upper body assist Assist Level: Total Assistance - Patient < 25%    Lower Body Dressing/Undressing Lower body dressing      What is the patient wearing?: Pants     Lower body assist Assist for lower body dressing: Dependent - Patient 0%     Toileting Toileting    Toileting assist Assist for toileting: Dependent - Patient 0%     Transfers Chair/bed transfer  Transfers assist     Chair/bed transfer assist level: 2 Helpers     Locomotion Ambulation   Ambulation assist   Ambulation activity did not occur: Safety/medical concerns          Walk 10 feet activity   Assist  Walk 10 feet activity did not occur: Safety/medical concerns        Walk 50 feet  activity   Assist Walk 50 feet with 2 turns activity did not occur: Safety/medical concerns         Walk 150 feet activity   Assist Walk 150 feet activity did not occur: Safety/medical concerns         Walk 10 feet on uneven surface  activity   Assist Walk 10 feet on uneven surfaces activity did not occur: Safety/medical concerns         Wheelchair     Assist Will patient use wheelchair at discharge?: Yes Type of Wheelchair: Manual    Wheelchair assist level: Maximal Assistance - Patient 25 - 49% Max wheelchair distance: 5    Wheelchair 50 feet with 2 turns activity    Assist    Wheelchair 50 feet with 2 turns activity  did not occur: Safety/medical concerns       Wheelchair 150 feet activity     Assist  Wheelchair 150 feet activity did not occur: Safety/medical concerns       Blood pressure 115/67, pulse 98, temperature 98.1 F (36.7 C), temperature source Oral, resp. rate 16, weight 121 kg, SpO2 91 %.  Medical Problem List and Plan: 1.  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  Antithrombotics: -DVT/anticoagulation: Lovenox 60 mg daily.  Vascular study negative             -antiplatelet therapy: N/A 3. Pain Management: Dilaudid 2 mg every 6 hours as needed pain 4. Mood: Melatonin 6 mg nightly, trazodone 100 mg nightly, Klonopin 1 mg nightly, Lexapro 20 mg daily, Valium 2.5 mg every 12 hours as needed anxiety  4/9- will try to get Neuropsych for pt due to anxiety.   4/10: Did not use valium today             -antipsychotic agents: N/A 5. Neuropsych: This patient is capable of making decisions on his own behalf. 6. Skin/Wound/ Care/deep tissue pressure injury bilateral heels and lateral feet: Wound care as directed 7. Fluids/Electrolytes/Nutrition: Strict INO's with follow-up chemistries  4/9- labs look good except BUN still 46- is dry? But is stable/better than was 1 week ago- was 51.  8.  Dysphagia.  Status post PEG tube 09/11/2019 per Dr. Worthy Keeler.  Diet has been advanced to regular.  Patient is still receiving nocturnal tube feeds by PEG tube.  4/9- doing calorie count, to see if can stop TFs 9.  Status post tracheostomy 08/06/2019.  Decannulated 11/08/2019. 10.  Hypertension.  Norvasc 10 mg daily, Coreg 25 mg twice daily, Aldactone 25 mg daily.  Monitor with increased mobility  4/9-4/10 BP well controlled- continue regimen. Addendum: Hypotensive to 106/49 at night; decreased  amlodipine dose to 27m.  11.  History of asthma.  Continue nebulizers as directed 12.  GERD.  Protonix 13.  BPH.  Flomax 0.4 mg daily. 14. Morbid obesity- current BMI of 40- will discuss diet, etc once more appropriate.  15. Insomnia- con't meds as above  4/9-4/10 sleeps fine- is his normal amount of sleep    LOS: 2 days A FACE TO FACE EVALUATION WAS PERFORMED  [name] 11/16/2019, 4:25 PM

## 2019-11-16 NOTE — Progress Notes (Signed)
Occupational Therapy Session Note  Patient Details  Name: [name] MRN: [id number] Date of Birth: [birth year]  Today's Date: 11/16/2019 OT Individual Time: 1105-1205 OT Individual Time Calculation (min): 60 min    Short Term Goals: Week 1:  OT Short Term Goal 1 (Week 1): Pt will transfer to w/c in prep for [facility] transfer wiht MAX A +2 in prep for toileting OT Short Term Goal 2 (Week 1): Pt will roll in B directions with MAX A of 1 caregiver to decrease BOC during LB dressing OT Short Term Goal 3 (Week 1): seated upright, pt will bring L hand to mouth in prep for self feeding/grooming OT Short Term Goal 4 (Week 1): Pt will thread 1UE into shirt with S  Skilled Therapeutic Interventions/Progress Updates:    1;1. Pt received in bed agreeable to OT. Pt dons pants, socks and shoes total A with MAX A to rollin B directions to don pants past hips. Pt supine>sitting EOB with TOTAL +2 and remains sitting statically with S. Pt completes downhill beazy board transfer with total A +2 to w/c with increased pain in R knee with knee flexion. Ice provided/repositioning after transfer. Pt completes drinking from cup with hOH A to brin cup to face with LUE. Pt OT propelled w/c throughout various tx spaces/outside courtyard for education on various tx techniques/machines and opportunities. Pt very grateful to breathe fresh air and get out of room. Exited session with pt seated in w/c, belt around hips to keep in place around chair and call light in reach   Therapy Documentation Precautions:  Precautions Precautions: Fall Precaution Comments: bilat wounds/feet, PEG Required Braces or Orthoses: Other Brace Other Brace: B prevalon boots; R wrist cock-up splint Restrictions Weight Bearing Restrictions: Yes RLE Weight Bearing: Non weight bearing LLE Weight Bearing: Non weight bearing Other Position/Activity Restrictions: to be clarified due to bilat wounds plantar and lateral aspect of feet General:    Vital Signs:   Pain:   ADL: ADL Eating: Dependent Grooming: Dependent Upper Body Bathing: Maximal assistance(MAX HOH A) Where Assessed-Upper Body Bathing: Edge of bed Lower Body Bathing: Dependent Where Assessed-Lower Body Bathing: Bed level Upper Body Dressing: Maximal assistance Where Assessed-Upper Body Dressing: Edge of bed Lower Body Dressing: Dependent(+2 to roll) Where Assessed-Lower Body Dressing: Bed level Toileting: Not assessed Toilet Transfer: Not assessed Vision   Perception    Praxis   Exercises:   Other Treatments:     Therapy/Group: Individual Therapy  Tonny Branch 11/16/2019, 12:14 PM

## 2019-11-16 NOTE — Progress Notes (Signed)
Speech Language Pathology Daily Session Note  Patient Details  Name: CASSIDY TABET MRN: [id number] Date of Birth: 13-Sep-1975  Today's Date: 11/16/2019 SLP Individual Time: 1000-1025 SLP Individual Time Calculation (min): 25 min  Short Term Goals: Week 1: SLP Short Term Goal 1 (Week 1): Patient will consume current diet with minimal overt s/s of aspiration with Mod I for use of swallowing compensatory strategies. SLP Short Term Goal 2 (Week 1): Patient will demonstrate complex problem solving for functional tasks with supervision verbal cues. SLP Short Term Goal 3 (Week 1): Patient will recall new, daily information with supervision verbal cues.  Skilled Therapeutic Interventions: Skilled treatment session focused on cognitive goals. SLP facilitated session by administering the Cognistat. Patient scored WFL on all subtests with the exception of mild impairments in short-term memory and calculations. Patient reports difficulty with calculations at baseline and the visual constriction subtest was not administered due to BUE weakness. Patient verbalized understanding of results. Patient left upright in bed with alarm on and all needs within reach. Continue with current plan of care.      Pain No/Denies Pain   Therapy/Group: Individual Therapy  Lynleigh Kovack 11/16/2019, 11:03 AM

## 2019-11-16 NOTE — Progress Notes (Signed)
RN was called to room patient c/o right ear congestion claims he' s unable to hear demanding to clean it with hydrogen peroxide and swab; explained to patient that we need an order to do that. Patient was not happy . MD notified.

## 2019-11-17 ENCOUNTER — Inpatient Hospital Stay (HOSPITAL_COMMUNITY): Payer: Managed Care, Other (non HMO) | Admitting: Occupational Therapy

## 2019-11-17 ENCOUNTER — Inpatient Hospital Stay (HOSPITAL_COMMUNITY): Payer: Managed Care, Other (non HMO)

## 2019-11-17 LAB — GLUCOSE, CAPILLARY
Glucose-Capillary: 113 mg/dL — ABNORMAL HIGH (ref 70–99)
Glucose-Capillary: 123 mg/dL — ABNORMAL HIGH (ref 70–99)
Glucose-Capillary: 127 mg/dL — ABNORMAL HIGH (ref 70–99)
Glucose-Capillary: 131 mg/dL — ABNORMAL HIGH (ref 70–99)
Glucose-Capillary: 91 mg/dL (ref 70–99)

## 2019-11-17 MED ORDER — ONDANSETRON HCL 4 MG PO TABS
4.0000 mg | ORAL_TABLET | Freq: Three times a day (TID) | ORAL | Status: DC | PRN
  Administered 2019-11-23: 13:00:00 4 mg via ORAL
  Filled 2019-11-17: qty 1

## 2019-11-17 MED ORDER — INSULIN ASPART 100 UNIT/ML ~~LOC~~ SOLN
0.0000 [IU] | Freq: Three times a day (TID) | SUBCUTANEOUS | Status: DC
  Administered 2019-11-17 – 2019-11-20 (×2): 2 [IU] via SUBCUTANEOUS
  Administered 2019-11-20: 09:00:00 3 [IU] via SUBCUTANEOUS
  Administered 2019-11-21 – 2019-11-22 (×2): 2 [IU] via SUBCUTANEOUS
  Administered 2019-11-22: 3 [IU] via SUBCUTANEOUS
  Administered 2019-11-24 – 2019-12-03 (×13): 2 [IU] via SUBCUTANEOUS

## 2019-11-17 NOTE — Progress Notes (Signed)
[place] PHYSICAL MEDICINE & REHABILITATION PROGRESS NOTE   Subjective/Complaints: Wife at bedside. Still has some earwax in right ear. Eye drops helping dry eyes Feeling a little nauseous this morning.   ROS: Pt denies SOB, abd pain, CP, N/V/C/D, and vision changes  Objective:   No results found. Recent Labs    11/15/19 0457  WBC 6.8  HGB 11.9*  HCT 38.2*  PLT 174   Recent Labs    11/15/19 0457  NA 137  K 4.1  CL 95*  CO2 29  GLUCOSE 131*  BUN 46*  CREATININE 0.77  CALCIUM 9.7    Intake/Output Summary (Last 24 hours) at 11/17/2019 1541 Last data filed at 11/17/2019 0900 Gross per 24 hour  Intake 240 ml  Output 1925 ml  Net -1685 ml     Physical Exam: Vital Signs Blood pressure 116/72, pulse 97, temperature 98.2 F (36.8 C), resp. rate 17, weight 120 kg, SpO2 96 %.  Constitutional: sitting up in [place] in NAD HENT: Ear wax in right ear.  Conjugate gaze;  Cortrak in nares and O2 by [place].  Cardiovascular: RRR- no JVD Respiratory: CTA B?l except a little coarse- more upper airway sounds, good air movement  GI: (+) PEG- soft, NT, ND, (+)BS Musculoskeletal:     Cervical back: Normal range of motion and neck supple.     Comments: RUE- delt 2-/5, biceps 4-/5, tricep 3/5, WE 3/5, grip 3/5, finger 2/5 LUE- delt 2-/5, biceps 2/5, triceps 3/5, WE 1/5, grip 2+/5, finger 1/5 LEs B/L- HF 2-/5, KE and KF 2/5, DF 1/5; PF 2/5 Neurological: Ox3 Pt reports sensation decreased in L thumb and RUE; normal in LEs to light touch No hoffman's or clonus B/L  Skin:  Has B/L anterior calf scrapes of skin- covered with dressings B/L- superficial scrapes/no sign of infection.  No skin breakdown anywhere else seen- bottom and heels  Psychiatric:  Very anxious- beating feet, hands like a tremor, but can stop it  Assessment/Plan: 1. 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  Physiatrist is providing close team supervision and 24 hour management of active medical problems listed below.  Physiatrist and rehab team continue to assess barriers to discharge/monitor patient progress toward functional and medical goals  Care Tool:  Bathing        Body parts bathed by helper: Right arm, Left arm, Chest, Abdomen, Front perineal area, Buttocks, Right upper leg, Left upper leg, Right lower leg, Left lower leg, Face     Bathing assist Assist Level: 2 Helpers     Upper Body Dressing/Undressing Upper body dressing   What is the patient wearing?: Pull over shirt    Upper body assist Assist Level: Total Assistance - Patient < 25%    Lower Body Dressing/Undressing Lower body dressing      What is the patient wearing?: Pants     Lower body assist Assist for lower body dressing: Dependent - Patient 0%     Toileting Toileting    Toileting assist Assist for toileting: Dependent - Patient 0%     Transfers Chair/bed transfer  Transfers assist     Chair/bed transfer assist level: 2 Helpers     Locomotion Ambulation   Ambulation assist   Ambulation activity did not occur: Safety/medical concerns          Walk 10 feet activity   Assist  Walk 10 feet activity did not occur:  Safety/medical concerns        Walk 50 feet activity   Assist Walk 50 feet with 2 turns activity did not occur: Safety/medical concerns         Walk 150 feet activity   Assist Walk 150 feet activity did not occur: Safety/medical concerns         Walk 10 feet on uneven surface  activity   Assist Walk 10 feet on uneven surfaces activity did not occur: Safety/medical concerns         Wheelchair     Assist Will patient use wheelchair at discharge?: Yes Type of Wheelchair: Manual    Wheelchair assist level: Maximal Assistance - Patient 25 - 49% Max wheelchair distance: 5    Wheelchair 50 feet with 2 turns  activity    Assist    Wheelchair 50 feet with 2 turns activity did not occur: Safety/medical concerns       Wheelchair 150 feet activity     Assist  Wheelchair 150 feet activity did not occur: Safety/medical concerns       Blood pressure 116/72, pulse 97, temperature 98.2 F (36.8 C), resp. rate 17, weight 120 kg, SpO2 96 %.  Medical Problem List and Plan: 1.  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  Antithrombotics: -DVT/anticoagulation: Lovenox 60 mg daily.  Vascular study negative             -antiplatelet therapy: N/A 3. Pain Management: Dilaudid 2 mg every 6 hours as needed pain 4. Mood: Melatonin 6 mg nightly, trazodone 100 mg nightly, Klonopin 1 mg nightly, Lexapro 20 mg daily, Valium 2.5 mg every 12 hours as needed anxiety  4/9- will try to get Neuropsych for pt due to anxiety.   4/10: Did not use valium today             -antipsychotic agents: N/A 5. Neuropsych: This patient is capable of making decisions on his own behalf. 6. Skin/Wound/ Care/deep tissue pressure injury bilateral heels and lateral feet: Wound care as directed  4/11: Ordered betadine dressings for bilateral feet as he was receiving in acute care.  7. Fluids/Electrolytes/Nutrition: Strict INO's with follow-up chemistries  4/9- labs look good except BUN still 46- is dry? But is stable/better than was 1 week ago- was 51.  8.  Dysphagia.  Status post PEG tube 09/11/2019 per Dr. Worthy Keeler.  Diet has been advanced to regular.  Patient is still receiving nocturnal tube feeds by PEG tube.  4/9- doing calorie count, to see if can stop TFs 9.  Status post tracheostomy 08/06/2019.  Decannulated 11/08/2019. 10.  Hypertension.  Norvasc 10 mg daily, Coreg 25 mg twice daily, Aldactone 25 mg  daily.  Monitor with increased mobility  4/9-4/10 BP well controlled- continue regimen. Addendum: Hypotensive to 106/49 at night; decreased amlodipine dose to 54m.   4/11: Normotensive with decreased dose of amlodipine.  11.  History of asthma.  Continue nebulizers as directed 12.  GERD.  Protonix 13.  BPH.  Flomax 0.4 mg daily. 14. Morbid obesity- current BMI of 40- will discuss diet, etc once more appropriate.  15. Insomnia- con't meds as above  4/9-4/10 sleeps fine- is his normal  amount of sleep 16. Right ear wax: Debrox ear drops    LOS: 3 days A FACE TO FACE EVALUATION WAS PERFORMED  Clide Deutscher Trevaughn Schear 11/17/2019, 3:41 PM

## 2019-11-17 NOTE — Progress Notes (Signed)
Pt concerned when he will receive bath. Offered to do it this morning but pt refused stating it was too early that he rather wait for later today.

## 2019-11-17 NOTE — Progress Notes (Addendum)
Physical Therapy Session Note  Patient Details  Name: [name] MRN: [id number] Date of Birth: April 11, 1976  Today's Date: 11/17/2019 PT Individual Time: 7846-9629 PT Individual Time Calculation (min): 63 min   Short Term Goals: Week 1:  PT Short Term Goal 1 (Week 1): Pt will tolerate OOB x 2+ hrs PT Short Term Goal 2 (Week 1): Pt will verbalize steps/instruct therapist w/ set up for SBT PT Short Term Goal 3 (Week 1): Bed to/from wc w/max assist of 2 PT Short Term Goal 4 (Week 1): Pt will roll w/max assist of 1 and cues using bed features  Skilled Therapeutic Interventions/Progress Updates: Pt presents in seated position in bed.  Pt reluctantly agreeable to participate w/ therapy.  Pt tolerated LE there ex w/ AAROM and verbal cues for AP, HS, abd/add 3 x 10 w/ rest breaks between sets.  Pt then states need for BM.  Pt required assist of 2 for rolling side to side in bed to place bedpan.  Pt able to reach and hold side rail w/ left hand and mod to max A to increase roll.  Pt did not have BM but needed use of urinal.  500 ml of urine and reported to NT for charting.  Pt w/ some urine spilling onto bed.  Pt rolled to remove bedpan change sheets.  Pt able to use left UE for some perineal care but required assist to finish.  Pt rolled opposite and sheet fitted.  Pt made comfortable in bed and assisted w/ Ensure shake.  All needs in reach near left hand.      Therapy Documentation Precautions:  Precautions Precautions: Fall Precaution Comments: bilat wounds/feet, PEG Required Braces or Orthoses: Other Brace Other Brace: B prevalon boots; R wrist cock-up splint Restrictions Weight Bearing Restrictions: Yes RLE Weight Bearing: Non weight bearing LLE Weight Bearing: Non weight bearing Other Position/Activity Restrictions: to be clarified due to bilat wounds plantar and lateral aspect of feet General:   Vital Signs:  Pain:  no c/o pain, but soreness to bottom.    Therapy/Group:  Individual Therapy  [name] 11/17/2019, 3:14 PM

## 2019-11-17 NOTE — Progress Notes (Signed)
Occupational Therapy Session Note  Patient Details  Name: [name] MRN: [id number] Date of Birth: Oct 14, 1975  Today's Date: 11/17/2019 OT Individual Time: 8921-1941 OT Individual Time Calculation (min): 70 min   Short Term Goals: Week 1:  OT Short Term Goal 1 (Week 1): Pt will transfer to w/c in prep for [facility] transfer wiht MAX A +2 in prep for toileting OT Short Term Goal 2 (Week 1): Pt will roll in B directions with MAX A of 1 caregiver to decrease BOC during LB dressing OT Short Term Goal 3 (Week 1): seated upright, pt will bring L hand to mouth in prep for self feeding/grooming OT Short Term Goal 4 (Week 1): Pt will thread 1UE into shirt with S  Skilled Therapeutic Interventions/Progress Updates:    Pt greeted in bed, just starting breakfast. OT placed pt in chair position for meal, vcs for correcting Rt lateral LOBs for neutral midline. Mod facilitation for spearing food and bringing food to mouth with the Lt arm. Pt able to grip utensil with Lt hand unassisted. He was also able to grip cups of juice. When his Lt arm became tired, transitioned to Rt arm with pt needing Total A to spear food, with Min-Mod facilitation for bringing food to mouth. Pt lacks functional grip of the Rt hand to manage utensils unassisted. Vcs for taking individual sips per swallowing precautions. Afterwards pt requested to wash up. Max A of 1 for anterior weight shift with his Lt hand gripping bedrail. 2nd helper washed his back and applied lotion at this time. HOH for using Rt and Lt upper limbs to wash UB. He needed increased assistance for thoroughness. Notified RN to assist him with washing LB after ~30 minutes. Per SLP note, pt to be sitting up for 30-45 minutes after eating before lying down. Left pt with all needs within reach and spouse present.      Therapy Documentation Precautions:  Precautions Precautions: Fall Precaution Comments: bilat wounds/feet, PEG Required Braces or Orthoses: Other  Brace Other Brace: B prevalon boots; R wrist cock-up splint Restrictions Weight Bearing Restrictions: Yes RLE Weight Bearing: Non weight bearing LLE Weight Bearing: Non weight bearing Other Position/Activity Restrictions: to be clarified due to bilat wounds plantar and lateral aspect of feet Vital Signs: Therapy Vitals Temp: 98.2 F (36.8 C) Pulse Rate: 97 Resp: 17 BP: 116/72 Patient Position (if appropriate): Lying Oxygen Therapy SpO2: 96 % O2 Device: Nasal Cannula Pain: Pt c/o pain in back and Rt lower leg, RN in at start of session to provide pain medicine  Pain Assessment Pain Scale: 0-10 Pain Score: 5  Pain Type: Acute pain Pain Location: Back Pain Descriptors / Indicators: Aching Pain Frequency: Occasional Pain Onset: On-going Pain Intervention(s): Medication (See eMAR) ADL: ADL Eating: Dependent Grooming: Dependent Upper Body Bathing: Maximal assistance(MAX HOH A) Where Assessed-Upper Body Bathing: Edge of bed Lower Body Bathing: Dependent Where Assessed-Lower Body Bathing: Bed level Upper Body Dressing: Maximal assistance Where Assessed-Upper Body Dressing: Edge of bed Lower Body Dressing: Dependent(+2 to roll) Where Assessed-Lower Body Dressing: Bed level Toileting: Not assessed Toilet Transfer: Not assessed     Therapy/Group: Individual Therapy  [name] 11/17/2019, 4:00 PM

## 2019-11-18 ENCOUNTER — Inpatient Hospital Stay (HOSPITAL_COMMUNITY): Payer: Managed Care, Other (non HMO) | Admitting: Speech Pathology

## 2019-11-18 ENCOUNTER — Inpatient Hospital Stay (HOSPITAL_COMMUNITY): Payer: Managed Care, Other (non HMO)

## 2019-11-18 ENCOUNTER — Inpatient Hospital Stay (HOSPITAL_COMMUNITY): Payer: Managed Care, Other (non HMO) | Admitting: Occupational Therapy

## 2019-11-18 LAB — GLUCOSE, CAPILLARY
Glucose-Capillary: 87 mg/dL (ref 70–99)
Glucose-Capillary: 109 mg/dL — ABNORMAL HIGH (ref 70–99)
Glucose-Capillary: 84 mg/dL (ref 70–99)
Glucose-Capillary: 132 mg/dL — ABNORMAL HIGH (ref 70–99)

## 2019-11-18 NOTE — Progress Notes (Signed)
Physical Therapy Session Note  Patient Details  Name: [name] MRN: [id number] Date of Birth: [birth year]  Today's Date: 11/18/2019 PT Individual Time: 0915-1000 PT Individual Time Calculation (min): 45 min   Short Term Goals: Week 1:  PT Short Term Goal 1 (Week 1): Pt will tolerate OOB x 2+ hrs PT Short Term Goal 2 (Week 1): Pt will verbalize steps/instruct therapist w/ set up for SBT PT Short Term Goal 3 (Week 1): Bed to/from wc w/max assist of 2 PT Short Term Goal 4 (Week 1): Pt will roll w/max assist of 1 and cues using bed features Week 2:    Week 3:     Skilled Therapeutic Interventions/Progress Updates:    PAIN 4/10 R ant hip, treatment to tolerance. Pt on 2L 02 for entire session.  Pt initially supine and agreeable to treatment session with focus on mobility, endurance. Pt requested to use urinal before OOB.  Total assist, pt continent of urine.  Supine to sit using bed feature, worked on trunk strength via unsupported sitting, wt shifting from mild recline to upright to hands on knees then progressed to knee extension 2x10 each/trunk unsuported/partial rom. Pt rested w/trunk supported by bed and Therapist then threaded pants thru bilat feet and raised to hips.    Bed returned to flat and pt rolls L/R w/max assist for therapist to raise pants.  L side to sit w/max assist of 2, cues to push up from L side using LUE.  Pt able to lean to R elbow w/max assistfor placement of board, returns upright w/max assist and cues.    wc propulsion total of 81f w/min assist and 3 rest breaks appros 1-2 min each.   SBT bed to wc max assist of 2 and cues for hand placement and wt shifting  Pt left oob in wc w/alarm belt set and needs in reach, NT in room w patient.   Therapy Documentation Precautions:  Precautions Precautions: Fall Precaution Comments: bilat wounds/feet, PEG Required Braces or Orthoses: Other Brace Other Brace: B prevalon boots; R wrist cock-up  splint Restrictions Weight Bearing Restrictions: Yes RLE Weight Bearing: Non weight bearing LLE Weight Bearing: Non weight bearing Other Position/Activity Restrictions: to be clarified due to bilat wounds plantar and lateral aspect of feet    Therapy/Group: Individual Therapy  BCallie Fielding PRio Blanco4/07/2020, 12:40 PM

## 2019-11-18 NOTE — Progress Notes (Signed)
Occupational Therapy Session Note  Patient Details  Name: [name] MRN: [id number] Date of Birth: 1976-05-05  Today's Date: 11/18/2019 OT Individual Time: 5110-2111 OT Individual Time Calculation (min): 55 min    Short Term Goals: Week 1:  OT Short Term Goal 1 (Week 1): Pt will transfer to w/c in prep for [facility] transfer wiht MAX A +2 in prep for toileting OT Short Term Goal 2 (Week 1): Pt will roll in B directions with MAX A of 1 caregiver to decrease BOC during LB dressing OT Short Term Goal 3 (Week 1): seated upright, pt will bring L hand to mouth in prep for self feeding/grooming OT Short Term Goal 4 (Week 1): Pt will thread 1UE into shirt with S  Skilled Therapeutic Interventions/Progress Updates:    Patient seated in w/c in pain due to sitting position.  Beasy board transfer w.c to bed max A of 2 (feet on platform due to height of bed) .   Sitting to supine dependent of 2.  He requires max A to reposition and adjust clothing.   Shampoo cap provided - hair washing/combing is dependent due to inability to reach.  Cup to mouth dependent in semi reclined position.  Dependent to use (dial and hold phone to ear) phone to place call for afternoon meal tray.   Completed bilateral UE PROM/AAROM/AROM with focus on scapular mobility, ER and elbow mobility to increase functional reach.  Reviewed positioning and activities he can complete on his own to facilitate improved motor control and strength.  He remained in bed at close of session, call bell in reach (he demonstrates ability to use standard call bell when in left hand)  Therapy Documentation Precautions:  Precautions Precautions: Fall Precaution Comments: bilat wounds/feet, PEG Required Braces or Orthoses: Other Brace Other Brace: B prevalon boots; R wrist cock-up splint Restrictions Weight Bearing Restrictions: Yes RLE Weight Bearing: Non weight bearing LLE Weight Bearing: Non weight bearing Other Position/Activity Restrictions: to  be clarified due to bilat wounds plantar and lateral aspect of feet General:   Vital Signs: Therapy Vitals Temp: 98.6 F (37 C) Temp Source: Oral Pulse Rate: 79 Resp: 18 BP: (!) 109/53 Patient Position (if appropriate): Lying Oxygen Therapy SpO2: 100 % O2 Device: Nasal Cannula  Therapy/Group: Individual Therapy  Carlos Levering 11/18/2019, 7:45 AM

## 2019-11-18 NOTE — Plan of Care (Signed)
  Problem: Education: Goal: Required Educational Video(s) Outcome: Progressing   Problem: Consults Goal: RH GENERAL PATIENT EDUCATION Description: See Patient Education module for education specifics. Outcome: Progressing Goal: Skin Care Protocol Initiated - if Braden Score 18 or less Description: If consults are not indicated, leave blank or document N/A Outcome: Progressing   Problem: RH BOWEL ELIMINATION Goal: RH STG MANAGE BOWEL WITH ASSISTANCE Description: STG Manage Bowel with min Assistance. Outcome: Progressing   Problem: RH BLADDER ELIMINATION Goal: RH STG MANAGE BLADDER WITH ASSISTANCE Description: STG Manage Bladder With min Assistance Outcome: Progressing   Problem: RH SKIN INTEGRITY Goal: RH STG SKIN FREE OF INFECTION/BREAKDOWN Description: Skin will be free of infection/breakdown with mod assistance Outcome: Progressing Goal: RH STG MAINTAIN SKIN INTEGRITY WITH ASSISTANCE Description: STG Maintain Skin Integrity With mod Assistance. Outcome: Progressing Goal: RH STG ABLE TO PERFORM INCISION/WOUND CARE W/ASSISTANCE Description: STG Able To Perform Incision/Wound Care With total Assistance. Outcome: Progressing   Problem: RH SAFETY Goal: RH STG ADHERE TO SAFETY PRECAUTIONS W/ASSISTANCE/DEVICE Description: STG Adhere to Safety Precautions With min Assistance/Device. Outcome: Progressing Goal: RH STG DECREASED RISK OF FALL WITH ASSISTANCE Description: STG Decreased Risk of Fall With max Assistance. Outcome: Progressing   Problem: RH PAIN MANAGEMENT Goal: RH STG PAIN MANAGED AT OR BELOW PT'S PAIN GOAL Description: Pain will be less than 3 out of 10 on pain scale. Pt will be able to verbalize when in pain  Outcome: Progressing

## 2019-11-18 NOTE — Progress Notes (Signed)
Calorie Count Note  Calorie count ordered.  Diet: Regular Supplements: Ensure Enlive TID  No meal tickets placed in envelope for calorie count period of 4/9-4/11. Calorie count results are based on percentages of meal completed documented in Epic. Total kcals/protein may not be accurate given RD unable to see specifically which items the patient consumed.   Day 1 (11/15/19) Breakfast: 668 kcal, 24 grams protein Lunch: 276 kcal, 13 grams protein Dinner: 201 kcal, 11 grams protein Supplements: unsure if pt is consuming; RN unable to confirm  Total intake: 1145 kcal (37% of minimum estimated needs)  48 protein (26% of minimum estimated needs)  Day 2 (11/16/19) documentation not completed  Day 3 (11/17/19) Results: Breakfast: 435 kcal, 8 grams protein Lunch: 574 kcal, 26 grams protein Dinner: 435 kcal, 15 grams protein Supplements: unsure if pt is consuming; RN unable to confirm  Total intake: 1444 kcal (47% of minimum estimated needs)  49 protein (27% of minimum estimated needs)  Nutrition Dx: Increased nutrient needs related to wound healing as evidenced by estimated needs.  Goal: Patient will meet greater than or equal to 90% of their needs  Intervention: -D/c calorie count -Continue Ensure Enlive TID -Continue nocturnal feeding as ordered:      Osmolite 1.5 @ 75 ml/hr via PEG x 12hrs (1800-0600) with 60 ml Prostat QID and Juven BID   At goal TF provides:2310kcal, 181grams protein, and 63m free water. Meets 76% kcal needs and100% of protein needs.   Of note, pt has extremely high protein and calorie needs to aid in healing. Additionally, pt is dependent on staff for feeding and consumption of oral nutrition supplements. It will be difficult for this patient to meet his increased needs po. Nocturnal tube feeding should not be discontinued.    ALarkin Ina MS, RD, LDN RD pager number and weekend/on-call pager number [place] in [place]

## 2019-11-18 NOTE — Progress Notes (Signed)
Speech Language Pathology Daily Session Note  Patient Details  Name: BOY DELAMATER MRN: [id number] Date of Birth: Dec 19, 1975  Today's Date: 11/18/2019 SLP Individual Time: 0800-0905 SLP Individual Time Calculation (min): 65 min  Short Term Goals: Week 1: SLP Short Term Goal 1 (Week 1): Patient will consume current diet with minimal overt s/s of aspiration with Mod I for use of swallowing compensatory strategies. SLP Short Term Goal 2 (Week 1): Patient will demonstrate complex problem solving for functional tasks with supervision verbal cues. SLP Short Term Goal 3 (Week 1): Patient will recall new, daily information with supervision verbal cues.  Skilled Therapeutic Interventions: Skilled treatment session focused on dysphagia and cognitive goals. SLP facilitated session by providing skilled observation with breakfast meal of regular textures with thin liquids. Patient consumed meal with subtle and intermittent throat clearing when consuming cereal, suspect due to mixed consistencies. Patient also had 1 instance of expectorating a pill that was too large to consume with thin liquids, therefore, it was administered in applesauce without further difficulty. Patient appeared more lethargic today with decreased social interaction and increased perseveration on wants/needs which impacted his ability to consume his meal. However, patient recalled events from yesterday's therapy sessions with Mod I. Patient left upright in bed with alarm on and all needs within reach. Continue with current plan of care.      Pain Pain Assessment Pain Score: 0-No pain  Therapy/Group: Individual Therapy  Basel Defalco 11/18/2019, 9:11 AM

## 2019-11-18 NOTE — Progress Notes (Addendum)
Social Work Patient ID: [name], male   DOB: [birth year], 44 y.o.   MRN: [id number]   SW spoke with pt wife to discuss plan of care for patient at d/c. Pt will atleast need to be Mod I overall (such as being abel to feed, get to bathroom with use of RW on his own or use RW to get to [name], etc) as he will only have intermittent check-ins. She will continue to work and their teenage son will be returning to school full-time. States there is a possibility of some limited supervision from other family members PRN but not sure. SW indicated will express concerns to team, and will provide updates from team conference.    Loralee Pacas, MSW, [place] Office: [phone] Cell: [phone] Fax: [phone]

## 2019-11-18 NOTE — Care Management (Signed)
[facility] Individual Statement of Services  Patient Name:  [name]  Date:  11/18/2019  Welcome to the [facility].  Our goal is to provide you with an individualized program based on your diagnosis and situation, designed to meet your specific needs.  With this comprehensive rehabilitation program, you will be expected to participate in at least 3 hours of rehabilitation therapies Monday-Friday, with modified therapy programming on the weekends.  Your rehabilitation program will include the following services:  Physical Therapy (PT), Occupational Therapy (OT), Speech Therapy (ST), 24 hour per day rehabilitation nursing, Therapeutic Recreaction (TR), Psychology, Neuropsychology, Case Management (Social Worker), Rehabilitation Medicine, Nutrition Services, Pharmacy Services and Other  Weekly team conferences will be held on Tuesdays to discuss your progress.  Your Social Worker will talk with you frequently to get your input and to update you on team discussions.  Team conferences with you and your family in attendance may also be held.  Expected length of stay: 4 weeks    Overall anticipated outcome: Minimal Assistance  Depending on your progress and recovery, your program may change. Your Social Worker will coordinate services and will keep you informed of any changes. Your Social Worker's name and contact numbers are listed  below.  The following services may also be recommended but are not provided by the [facility] will be made to provide these services after discharge if needed.  Arrangements include referral to agencies that provide these services.  Your insurance has been verified to be:  [place]  Your primary doctor is:  Jeneen Rinks [place]  Pertinent information will be shared  with your doctor and your insurance company.  Social Worker:  Loralee Pacas, LCSWA  Information discussed with and copy given to patient by: Rana Snare, 11/18/2019, 12:26 PM

## 2019-11-18 NOTE — Progress Notes (Signed)
[facility] PHYSICAL MEDICINE & REHABILITATION PROGRESS NOTE   Subjective/Complaints:  [name] reports SLP here to help him eat breakfast- didn't sleep well- no particular reason- pain "same placed" and between legs from [name]    ROS:  [name] denies SOB, abd pain, CP, N/V/C/D, and vision changes  Objective:   No results found. No results for input(s): WBC, HGB, HCT, PLT in the last 72 hours. No results for input(s): NA, K, CL, CO2, GLUCOSE, BUN, CREATININE, CALCIUM in the last 72 hours.  Intake/Output Summary (Last 24 hours) at 11/18/2019 1920 Last data filed at 11/18/2019 1640 Gross per 24 hour  Intake 360 ml  Output 1750 ml  Net -1390 ml     Physical Exam: Vital Signs Blood pressure (!) 118/52, pulse 97, temperature 99.5 F (37.5 C), resp. rate 20, weight 117.2 kg, SpO2 97 %.  Constitutional: sitting up in specialized bed; SLP at bedside about to help with eating, NAD HENT: O2 by [place] 1.5L- down from 2L.   Cardiovascular:RRR- but close to tachycardia Respiratory: CTA b/L- good air movement GI: soft, NT, ND, (+)BS Musculoskeletal:     Cervical back: Normal range of motion and neck supple.     Comments: RUE- delt 2-/5, biceps 4-/5, tricep 3/5, WE 3/5, grip 3/5, finger 2/5 LUE- delt 2-/5, biceps 2/5, triceps 3/5, WE 1/5, grip 2+/5, finger 1/5 LEs B/L- HF 2-/5, KE and KF 2/5, DF 1/5; PF 2/5 Neurological:Ox3 [name] reports sensation decreased in L thumb and RUE; normal in LEs to light touch No hoffman's or clonus B/L  Skin:  Has B/L anterior calf scrapes of skin- covered with dressings B/L- superficial scrapes/no sign of infection.  No skin breakdown anywhere else seen- bottom and heels  Psychiatric:  Depressed, anxious affect  Assessment/Plan: 1. 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  Physiatrist is providing close team supervision and 24 hour management of active medical  problems listed below.  Physiatrist and rehab team continue to assess barriers to discharge/monitor patient progress toward functional and medical goals  Care Tool:  Bathing        Body parts bathed by helper: Right arm, Left arm, Chest, Abdomen, Front perineal area, Buttocks, Right upper leg, Left upper leg, Right lower leg, Left lower leg, Face     Bathing assist Assist Level: 2 Helpers     Upper Body Dressing/Undressing Upper body dressing   What is the patient wearing?: Pull over shirt    Upper body assist Assist Level: Total Assistance - Patient < 25%    Lower Body Dressing/Undressing Lower body dressing      What is the patient wearing?: Pants     Lower body assist Assist for lower body dressing: Dependent - Patient 0%     Toileting Toileting    Toileting assist Assist for toileting: Dependent - Patient 0%     Transfers Chair/bed transfer  Transfers assist     Chair/bed transfer assist level: 2 Helpers     Locomotion Ambulation   Ambulation assist   Ambulation activity did not occur: Safety/medical concerns          Walk 10 feet activity   Assist  Walk 10 feet activity did not occur: Safety/medical concerns        Walk 50 feet activity   Assist Walk 50 feet with 2 turns activity did not occur: Safety/medical concerns         Walk 150  feet activity   Assist Walk 150 feet activity did not occur: Safety/medical concerns         Walk 10 feet on uneven surface  activity   Assist Walk 10 feet on uneven surfaces activity did not occur: Safety/medical concerns         Wheelchair     Assist Will patient use wheelchair at discharge?: Yes Type of Wheelchair: Manual    Wheelchair assist level: Minimal Assistance - Patient > 75% Max wheelchair distance: 50(w/mult rest breaks)    Wheelchair 50 feet with 2 turns activity    Assist    Wheelchair 50 feet with 2 turns activity did not occur: Safety/medical  concerns(unable to turn wc)       Wheelchair 150 feet activity     Assist  Wheelchair 150 feet activity did not occur: Safety/medical concerns       Blood pressure (!) 118/52, pulse 97, temperature 99.5 F (37.5 C), resp. rate 20, weight 117.2 kg, SpO2 97 %.  Medical Problem List and Plan: 1.  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  Antithrombotics: -DVT/anticoagulation: Lovenox 60 mg daily.  Vascular study negative             -antiplatelet therapy: N/A 3. Pain Management: Dilaudid 2 mg every 6 hours as needed pain 4. Mood: Melatonin 6 mg nightly, trazodone 100 mg nightly, Klonopin 1 mg nightly, Lexapro 20 mg daily, Valium 2.5 mg every 12 hours as needed anxiety  4/9- will try to get Neuropsych for [name] due to anxiety.   4/10: Did not use valium today  4/12- neuropsych on schedule per SW             -antipsychotic agents: N/A 5. Neuropsych: This patient is capable of making decisions on his own behalf. 6. Skin/Wound/ Care/deep tissue pressure injury bilateral heels and lateral feet: Wound care as directed  4/11: Ordered betadine dressings for bilateral feet as he was receiving in acute care.  7. Fluids/Electrolytes/Nutrition: Strict INO's with follow-up chemistries  4/9- labs look good except BUN still 46- is dry? But is stable/better than was 1 week ago- was 51.  4/12- will recheck labs in AM  8.  Dysphagia.  Status post PEG tube 09/11/2019 per Dr. Worthy Keeler.  Diet has been advanced to regular.  Patient is still receiving nocturnal tube feeds by PEG tube.  4/9- doing calorie count, to see if can stop TFs 9.  Status post tracheostomy 08/06/2019.  Decannulated 11/08/2019. 10.  Hypertension.  Norvasc 10 mg daily, Coreg 25 mg twice daily, Aldactone 25 mg daily.   Monitor with increased mobility  4/9-4/10 BP well controlled- continue regimen. Addendum: Hypotensive to 106/49 at night; decreased amlodipine dose to 49m.   4/11: Normotensive with decreased dose of amlodipine.   4/12- BP doing well 110s/60s 11.  History of asthma.  Continue nebulizers as directed 12.  GERD.  Protonix 13.  BPH.  Flomax 0.4 mg daily. 14. Morbid obesity- current BMI of 40- will discuss diet, etc once more appropriate.  15. Insomnia- con't meds as above  4/9-4/10 sleeps fine- is his normal amount of sleep  4/12- doesn't sleep well, but is chronic- on Klonopin  AND trazodone 100 mg QHS 16. Right ear wax: Debrox ear drops    LOS: 4 days A FACE TO FACE EVALUATION WAS PERFORMED  Casia Corti 11/18/2019, 7:20 PM

## 2019-11-19 ENCOUNTER — Inpatient Hospital Stay (HOSPITAL_COMMUNITY): Payer: Managed Care, Other (non HMO) | Admitting: Physical Therapy

## 2019-11-19 ENCOUNTER — Inpatient Hospital Stay (HOSPITAL_COMMUNITY): Payer: Managed Care, Other (non HMO) | Admitting: Speech Pathology

## 2019-11-19 ENCOUNTER — Encounter (HOSPITAL_COMMUNITY): Payer: Managed Care, Other (non HMO) | Admitting: Psychology

## 2019-11-19 ENCOUNTER — Inpatient Hospital Stay (HOSPITAL_COMMUNITY): Payer: Managed Care, Other (non HMO) | Admitting: Occupational Therapy

## 2019-11-19 ENCOUNTER — Inpatient Hospital Stay (HOSPITAL_COMMUNITY): Payer: Managed Care, Other (non HMO)

## 2019-11-19 LAB — CBC WITH DIFFERENTIAL/PLATELET
Abs Immature Granulocytes: 0.06 10*3/uL (ref 0.00–0.07)
Basophils Absolute: 0.1 10*3/uL (ref 0.0–0.1)
Basophils Relative: 1 %
Eosinophils Absolute: 0.3 10*3/uL (ref 0.0–0.5)
Eosinophils Relative: 4 %
HCT: 36.7 % — ABNORMAL LOW (ref 39.0–52.0)
Hemoglobin: 11.4 g/dL — ABNORMAL LOW (ref 13.0–17.0)
Immature Granulocytes: 1 %
Lymphocytes Relative: 26 %
Lymphs Abs: 1.9 10*3/uL (ref 0.7–4.0)
MCH: 27.3 pg (ref 26.0–34.0)
MCHC: 31.1 g/dL (ref 30.0–36.0)
MCV: 88 fL (ref 80.0–100.0)
Monocytes Absolute: 0.6 10*3/uL (ref 0.1–1.0)
Monocytes Relative: 8 %
Neutro Abs: 4.5 10*3/uL (ref 1.7–7.7)
Neutrophils Relative %: 60 %
Platelets: 186 10*3/uL (ref 150–400)
RBC: 4.17 MIL/uL — ABNORMAL LOW (ref 4.22–5.81)
RDW: 14.2 % (ref 11.5–15.5)
WBC: 7.4 10*3/uL (ref 4.0–10.5)
nRBC: 0 % (ref 0.0–0.2)

## 2019-11-19 LAB — GLUCOSE, CAPILLARY
Glucose-Capillary: 117 mg/dL — ABNORMAL HIGH (ref 70–99)
Glucose-Capillary: 97 mg/dL (ref 70–99)
Glucose-Capillary: 97 mg/dL (ref 70–99)
Glucose-Capillary: 115 mg/dL — ABNORMAL HIGH (ref 70–99)

## 2019-11-19 LAB — BASIC METABOLIC PANEL
Anion gap: 11 (ref 5–15)
BUN: 40 mg/dL — ABNORMAL HIGH (ref 6–20)
CO2: 29 mmol/L (ref 22–32)
Calcium: 9.5 mg/dL (ref 8.9–10.3)
Chloride: 95 mmol/L — ABNORMAL LOW (ref 98–111)
Creatinine, Ser: 0.83 mg/dL (ref 0.61–1.24)
GFR calc Af Amer: >60 mL/min (ref >60)
GFR calc non Af Amer: >60 mL/min (ref >60)
Glucose, Bld: 135 mg/dL — ABNORMAL HIGH (ref 70–99)
Potassium: 4.3 mmol/L (ref 3.5–5.1)
Sodium: 135 mmol/L (ref 135–145)

## 2019-11-19 MED ORDER — CHLORHEXIDINE GLUCONATE CLOTH 2 % EX PADS
6.0000 | MEDICATED_PAD | Freq: Every day | CUTANEOUS | Status: DC
  Administered 2019-11-19 – 2019-11-24 (×4): 6 via TOPICAL

## 2019-11-19 MED ORDER — SODIUM CHLORIDE 0.9% FLUSH: 10.0000 mL | INTRAVENOUS | Status: DC | PRN

## 2019-11-19 MED ORDER — SODIUM CHLORIDE 0.9% FLUSH
10.0000 mL | Freq: Two times a day (BID) | INTRAVENOUS | Status: DC
  Administered 2019-11-19 – 2019-11-20 (×2): 10 mL
  Administered 2019-11-20: 13:00:00 15 mL
  Administered 2019-11-21 – 2019-11-28 (×13): 10 mL
  Administered 2019-11-30: 30 mL

## 2019-11-19 NOTE — Plan of Care (Signed)
  Problem: RH BLADDER ELIMINATION Goal: RH STG MANAGE BLADDER WITH ASSISTANCE Description: STG Manage Bladder With min Assistance Outcome: Progressing   Problem: RH SKIN INTEGRITY Goal: RH STG SKIN FREE OF INFECTION/BREAKDOWN Description: Skin will be free of infection/breakdown with mod assistance Outcome: Progressing Goal: RH STG MAINTAIN SKIN INTEGRITY WITH ASSISTANCE Description: STG Maintain Skin Integrity With mod Assistance. Outcome: Progressing

## 2019-11-19 NOTE — Progress Notes (Signed)
Occupational Therapy Session Note  Patient Details  Name: CREWS MCCOLLAM MRN: [id number] Date of Birth: 1975-09-22  Today's Date: 11/19/2019 OT Individual Time: 8891-6945 OT Individual Time Calculation (min): 85 min    Short Term Goals: Week 1:  OT Short Term Goal 1 (Week 1): Pt will transfer to w/c in prep for [facility] transfer wiht MAX A +2 in prep for toileting OT Short Term Goal 2 (Week 1): Pt will roll in B directions with MAX A of 1 caregiver to decrease BOC during LB dressing OT Short Term Goal 3 (Week 1): seated upright, pt will bring L hand to mouth in prep for self feeding/grooming OT Short Term Goal 4 (Week 1): Pt will thread 1UE into shirt with S  Skilled Therapeutic Interventions/Progress Updates:    Patient in bed, alert and ready for therapy.  Nurse present and provided pain meds for right groin and leg pain.  Completed LB bathing and dressing bed level - he is able to wash front of abdomin with min A, dependent for bilateral legs and buttocks, dependent for LB dressing and footwear.  Rolling in bed max A of one - he can maintain sidelying once positioned.  Side lying to sitting position max A of 2. beasy board transfer bed to w.c dependent of 2 with bilateral feet on elevated surface due to height of bed.  Completed UB bathing and dressing w.c level at sink - max A for bathing, dependent for OH shirt, unable to reach to wash face, requires min A and arm support to brush teeth (dependent for cup to mouth to rinse).   Lift (maxi move) transfer back to bed due to discomfort in current w/c - attempted hoyer first but unable to clear due to height of bed surface.  He remained in bed at close of session with nursing present.    Therapy Documentation Precautions:  Precautions Precautions: Fall Precaution Comments: bilat wounds/feet, PEG Required Braces or Orthoses: Other Brace Other Brace: B prevalon boots; R wrist cock-up splint Restrictions Weight Bearing Restrictions: Yes RLE Weight  Bearing: (P) Non weight bearing LLE Weight Bearing: (P) Non weight bearing Other Position/Activity Restrictions: to be clarified due to bilat wounds plantar and lateral aspect of feet   Therapy/Group: Individual Therapy  Carlos Levering 11/19/2019, 7:43 AM

## 2019-11-19 NOTE — Patient Care Conference (Signed)
Inpatient RehabilitationTeam Conference and Plan of Care Update Date: 11/19/2019   Time: 11:40 AM    Patient Name: [name]      Medical Record Number: [id number]  Date of Birth: 19-Feb-1976 Sex: Male         Room/Bed: 4W11C/4W11C-01 Payor Info: Payor: [employer] / Plan: CIGNA MANAGED / Product Type: *No Product type* /    Admit Date/Time:  11/14/2019  2:11 PM  Primary Diagnosis:  Critical illness myopathy  Patient Active Problem List   Diagnosis Date Noted  . 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  . History of COVID-19   . ARDS (adult respiratory distress syndrome) ([place])   . Pressure injury of skin 08/22/2019  . Need for emotional support   . Chest tube in place   . Personal history of ECMO   . Advanced care planning/counseling discussion   . Acute respiratory distress syndrome (ARDS) due to COVID-19 virus ([place])   . Palliative care by specialist   . Goals of care, counseling/discussion   . Advanced directives, counseling/discussion   . Subcutaneous crepitus   . Pneumonia due to COVID-19 virus 07/23/2019  . Acute respiratory failure ([place]) 07/22/2019  . Persistent asthma with undetermined severity   . Thrombocytopenia ([place])   . Leukopenia   . Fever   . Gram positive bacterial infection   . Septic arthritis of right acromioclavicular joint ([place]) 09/29/2017  . Chronic left-sided low back pain with left-sided sciatica 09/19/2017  . Epigastric pain   . Pancreatitis 04/17/2017  . Essential hypertension 04/17/2017  . Moderate persistent asthma 07/21/2015  . Allergic rhinoconjunctivitis 07/21/2015  . GERD (gastroesophageal reflux disease) 07/21/2015  . ABNORMALITY OF GAIT 11/12/2009  . TARSAL TUNNEL SYNDROME, LEFT 10/08/2009  . PES PLANUS 10/08/2009    Expected Discharge Date: Expected Discharge Date: (Estimated additional 3 week LOS)  Team Members Present: Physician leading conference: Dr. Courtney Heys Care Coodinator Present: Loralee Pacas, LCSWA;Christina Sampson Goon,  BSW;Genie Laymond Postle, RN, MSN Nurse Present: Other (comment)(Mayra Hawkins, student RN) PT Present: Michaelene Song, PT OT Present: Elisabeth Most, OT SLP Present: Weston Anna, SLP PPS Coordinator present : Gunnar Fusi, SLP     Current Status/Progress Goal Weekly Team Focus  Bowel/Bladder   patient is continent and incontinent of bladder and stool, uses urinal/bedpan  [place] continence  Assess toileting needs QS/PRN,,Bowel trainaing Q2 hrs and PRN   Swallow/Nutrition/ Hydration   Regular textures with thin liquids, Supervision-Mod I  Mod I  tolerance of current diet, use of compensatory strategies   ADL's   dependent bathing, dressing, grooming and max A for eating.  max A/dependent x2 for bed moblity and beasy board transfers  min a  bed mobillity, seated balance/trunk control, UB strength and ROM, UB adl   Mobility   max assist bed mobility, total assist +2 for slideboard transfers, w/c propulsion 5-10 ft min assist  supervision-min assist  balance, transfers, OOB tolerance, UE/LE strength, endurance, functional mobility   Communication             Safety/Cognition/ Behavioral Observations  Supervision  Mod I  complex problem solving, recall   Pain   pain to back and shoulder 8/10  >= 3  QS/PRN assess pain administer and evaluate effectine   Skin   Skin intact had areas to left anterior calf area, no drainage, otherwisr skin looking weii  maintain skin integrity free of breakdown  QS/PRN assessment , repostioning turning    Rehab Goals Patient on target to meet rehab goals: Yes *See Care  Plan and progress notes for long and short-term goals.     Barriers to Discharge  Current Status/Progress Possible Resolutions Date Resolved   Nursing                  PT                    OT                  SLP                SW Decreased caregiver support;Lack of/limited family support;Medical stability              Discharge Planning/Teaching Needs:  Pt will need to be  overall Mod I as there will only be intermittent support at d/c;  pt son will be returning to school fulltime on-site and wife continues to work.  Family education as recommended by therapy   Team Discussion: MD in hospital 4 months, post covid, has PEG, has dysphagia, anxious, BP monitoring, pain okay.  RN A+O, inc episodes, BM today, old trach site, LL leg abrasions, groin moisture.  OT minimal use of hands, dependent all self care, max roll, max sit up in bed, tot +2 BZ board, feet hurt, sat up in w/c tot A, max A eating, needs roho cushion. PT R groin/hip/back pain, max a sitting, painful feet.  SLP Reg/thins, mild higher level ST recal work.  Wife works, cannot take off from work, will likely need SNF placement at [place].  Revisions to Treatment Plan: N/a     Medical Summary Current Status: Ox3; can be incontinent; LBM today; 1L Of O2; old trach site; abrasions to B/L calves- moisture in groin; bottom of feet look wounded Weekly Focus/Goal: OT_ very limited use of UEs; can't brush teeth without assistance; can reach torso partially; D for all self care; max assist to roll; D with beezy board of 2- not good transfer  Barriers to Discharge: Behavior;Decreased family/caregiver support;Home enviroment access/layout;Incontinence;Wound care  Barriers to Discharge Comments: ICU myopathy- can use call button; can't reach mouth Possible Resolutions to Barriers: PT-awful pain from bottom- needs ROHO; needs tilt in space for activity tolerance; R groin/hip/back pain- new; bed too high- feet painful; max assist bed.   Continued Need for Acute Rehabilitation Level of Care: The patient requires daily medical management by a physician with specialized training in physical medicine and rehabilitation for the following reasons: Direction of a multidisciplinary physical rehabilitation program to maximize functional independence : Yes Medical management of patient stability for increased activity during participation  in an intensive rehabilitation regime.: Yes Analysis of laboratory values and/or radiology reports with any subsequent need for medication adjustment and/or medical intervention. : Yes   I attest that I was present, lead the team conference, and concur with the assessment and plan of the team.   [name] 11/19/2019, 6:19 PM   Team conference was held via web/ teleconference due to [place] - 19

## 2019-11-19 NOTE — Progress Notes (Signed)
Physical Therapy Session Note  Patient Details  Name: [name] MRN: [id number] Date of Birth: September 11, 1975  Today's Date: 11/19/2019 PT Individual Time: 1100-1130 and 1300-1330 PT Individual Time Calculation (min): 30 min and 30 min    Short Term Goals: Week 1:  PT Short Term Goal 1 (Week 1): Pt will tolerate OOB x 2+ hrs PT Short Term Goal 2 (Week 1): Pt will verbalize steps/instruct therapist w/ set up for SBT PT Short Term Goal 3 (Week 1): Bed to/from wc w/max assist of 2 PT Short Term Goal 4 (Week 1): Pt will roll w/max assist of 1 and cues using bed features  Skilled Therapeutic Interventions/Progress Updates:    Session 1: Pt supine in bed upon PT arrival, agreeable to therapy tx and reports pain 5/10 in R groin/hip/back. Pt reports nursing just assisted pt back to bed via lift. Pt participated in supine LE exercises while RN was preparing medications. Performed 2 x 10 of the following - active assisted heel slides, active assisted hip abduction/adduction and ankle pumps. Pt transferred to sitting EOB this session with max assist, cues for techniques. Pt able to maintain sitting EOB x 15 minutes this session working on upright activity tolerance and sitting balance. Seated EOB pt performed 2 x 5 sitting<>sidelying on elbow for core and UE strengthening, limited on R side due to pain, mod assist. Seated EOB pt performed x 10 LAQ per LE for strengthening. Seated EOB pt performed active assisted shoulder flexion and shoulder abduction to 90 deg 2 x 10 bilaterally for strengthening. Sit>supine at end of session with max assist, +2 to boost up in bed. Pt left in bed at end of session with needs in reach and bed alarm set.   Session 2: Pt supine in bed upon PT arrival, agreeable to therapy tx and reports continued R groin pain 5/10- monitored during session and offered repositioning. Therapist provided pt with TIS w/c this session in order to promote OOB tolerance and provide pressure relief  with tilt feature. Pt agreeable to try transferred to chair. Supine>sitting with max +2 assist. Once sitting 4 inch step under feet secondary to high bed, used step for safety and to allow for LE weightbearing. Pt performed beasyboard transfer with max +2 assist, step under feet, cues for techniques and facilitation for weightshift. Discussed goal of trying to stay OOB for 30-60 min and nursing to assist pt back to bed later this afternoon via hoyer lift, pt agreeable. Therapist provided education regarding importance of trying to stay up in w/c between therapies. Donned B prevalon boots while in w/c (painful to have feet on foot plates), reclined patient and added towel for padding to leg rest. Pt left in TIS at end of session, needs in reach.    Therapy Documentation Precautions:  Precautions Precautions: Fall Precaution Comments: bilat wounds/feet, PEG Required Braces or Orthoses: Other Brace Other Brace: B prevalon boots; R wrist cock-up splint Restrictions Weight Bearing Restrictions: Yes RLE Weight Bearing: (P) Non weight bearing LLE Weight Bearing: (P) Non weight bearing Other Position/Activity Restrictions: to be clarified due to bilat wounds plantar and lateral aspect of feet    Therapy/Group: Individual Therapy  Netta Corrigan, PT, DPT, CSRS 11/19/2019, 7:58 AM

## 2019-11-19 NOTE — Progress Notes (Signed)
[name] PHYSICAL MEDICINE & REHABILITATION PROGRESS NOTE   Subjective/Complaints:   Pt denies any issues- LBM a few hours ago.  Bowels OK.     ROS:  Pt denies SOB, abd pain, CP, N/V/C/D, and vision changes  Objective:   No results found. Recent Labs    11/19/19 0529  WBC 7.4  HGB 11.4*  HCT 36.7*  PLT 186   Recent Labs    11/19/19 0529  NA 135  K 4.3  CL 95*  CO2 29  GLUCOSE 135*  BUN 40*  CREATININE 0.83  CALCIUM 9.5    Intake/Output Summary (Last 24 hours) at 11/19/2019 1015 Last data filed at 11/19/2019 0900 Gross per 24 hour  Intake 1300 ml  Output 1900 ml  Net -600 ml     Physical Exam: Vital Signs Blood pressure 134/69, pulse 87, temperature 97.9 F (36.6 C), temperature source Oral, resp. rate 18, weight 117.2 kg, SpO2 94 %.  Constitutional:sitting up being fed by CNA- still very weak, appropriate, NAD HENT: O2 by [place]- 1.5L.   Cardiovascular:RRR_ no JVD Respiratory: CTA B/L GI: soft, NT, ND, (+)BS Musculoskeletal:     Cervical back: Normal range of motion and neck supple.     Comments: RUE- delt 2-/5, biceps 4-/5, tricep 3/5, WE 3/5, grip 3/5, finger 2/5 LUE- delt 2-/5, biceps 2/5, triceps 3/5, WE 1/5, grip 2+/5, finger 1/5 LEs B/L- HF 2-/5, KE and KF 2/5, DF 1/5; PF 2/5 Neurological:Ox3 Pt reports sensation decreased in L thumb and RUE; normal in LEs to light touch No hoffman's or clonus B/L  Skin:  Has B/L anterior calf scrapes of skin- covered with dressings B/L- superficial scrapes/no sign of infection.  Psychiatric:  Calmer affect, but still somewhat anxious  Assessment/Plan: 1. 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  Physiatrist is providing close team supervision and 24 hour management of active medical problems listed below.  Physiatrist and rehab team continue to assess barriers to discharge/monitor patient progress  toward functional and medical goals  Care Tool:  Bathing        Body parts bathed by helper: Right arm, Left arm, Chest, Abdomen, Front perineal area, Buttocks, Right upper leg, Left upper leg, Right lower leg, Left lower leg, Face     Bathing assist Assist Level: 2 Helpers     Upper Body Dressing/Undressing Upper body dressing   What is the patient wearing?: Pull over shirt    Upper body assist Assist Level: Total Assistance - Patient < 25%    Lower Body Dressing/Undressing Lower body dressing      What is the patient wearing?: Pants     Lower body assist Assist for lower body dressing: Dependent - Patient 0%     Toileting Toileting    Toileting assist Assist for toileting: Dependent - Patient 0%     Transfers Chair/bed transfer  Transfers assist     Chair/bed transfer assist level: 2 Helpers     Locomotion Ambulation   Ambulation assist   Ambulation activity did not occur: Safety/medical concerns          Walk 10 feet activity   Assist  Walk 10 feet activity did not occur: Safety/medical concerns        Walk 50 feet activity   Assist Walk 50 feet with 2 turns activity did not occur: Safety/medical concerns         Walk 150 feet activity  Assist Walk 150 feet activity did not occur: Safety/medical concerns         Walk 10 feet on uneven surface  activity   Assist Walk 10 feet on uneven surfaces activity did not occur: Safety/medical concerns         Wheelchair     Assist Will patient use wheelchair at discharge?: Yes Type of Wheelchair: Manual    Wheelchair assist level: Minimal Assistance - Patient > 75% Max wheelchair distance: 50(w/mult rest breaks)    Wheelchair 50 feet with 2 turns activity    Assist    Wheelchair 50 feet with 2 turns activity did not occur: Safety/medical concerns(unable to turn wc)       Wheelchair 150 feet activity     Assist  Wheelchair 150 feet activity did not  occur: Safety/medical concerns       Blood pressure 134/69, pulse 87, temperature 97.9 F (36.6 C), temperature source Oral, resp. rate 18, weight 117.2 kg, SpO2 94 %.  Medical Problem List and Plan: 1.  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  Antithrombotics: -DVT/anticoagulation: Lovenox 60 mg daily.  Vascular study negative             -antiplatelet therapy: N/A 3. Pain Management: Dilaudid 2 mg every 6 hours as needed pain 4. Mood: Melatonin 6 mg nightly, trazodone 100 mg nightly, Klonopin 1 mg nightly, Lexapro 20 mg daily, Valium 2.5 mg every 12 hours as needed anxiety  4/9- will try to get Neuropsych for pt due to anxiety.   4/10: Did not use valium today  4/12- neuropsych on schedule per SW             -antipsychotic agents: N/A 5. Neuropsych: This patient is capable of making decisions on his own behalf. 6. Skin/Wound/ Care/deep tissue pressure injury bilateral heels and lateral feet: Wound care as directed  4/11: Ordered betadine dressings for bilateral feet as he was receiving in acute care.  7. Fluids/Electrolytes/Nutrition: Strict INO's with follow-up chemistries  4/9- labs look good except BUN still 46- is dry? But is stable/better than was 1 week ago- was 51.  4/12- will recheck labs in AM   4/13- BUN down to 40- doing better- con't to monitor weekly.  8.  Dysphagia.  Status post PEG tube 09/11/2019 per Dr. Worthy Keeler.  Diet has been advanced to regular.  Patient is still receiving nocturnal tube feeds by PEG tube.  4/9- doing calorie count, to see if can stop TFs  4/13- cannot stop TFs per dietician, since pt has high protein/calorie needs to aid healing, and is Dependent on staff to feed him  everything.  9.  Status post tracheostomy 08/06/2019.  Decannulated 11/08/2019. 10.  Hypertension.  Norvasc 10 mg daily, Coreg 25 mg twice daily, Aldactone 25 mg daily.  Monitor with increased mobility  4/9-4/10 BP well controlled- continue regimen. Addendum: Hypotensive to 106/49 at night; decreased amlodipine dose to 66m.   4/13- BP 134/69 this AM- doing better with decreased Norvasc 11.  History of asthma.  Continue nebulizers as directed 12.  GERD.  Protonix 13.  BPH.  Flomax 0.4 mg daily. 14. Morbid obesity- current BMI of 40- will discuss diet, etc once more appropriate.  15. Insomnia- con't meds as above  4/9-4/10 sleeps fine- is his normal amount of sleep  4/12- doesn't sleep well, but is chronic- on Klonopin AND trazodone 100 mg QHS 16. Right ear wax: Debrox ear drops    LOS: 5 days A FACE TO FACE EVALUATION WAS PERFORMED  Malan Werk 11/19/2019, 10:15 AM

## 2019-11-19 NOTE — Progress Notes (Signed)
Speech Language Pathology Daily Session Note  Patient Details  Name: [name] MRN: [id number] Date of Birth: May 25, 1976  Today's Date: 11/19/2019 SLP Individual Time: 2703-5009 SLP Individual Time Calculation (min): 30 min  Short Term Goals: Week 1: SLP Short Term Goal 1 (Week 1): Patient will consume current diet with minimal overt s/s of aspiration with Mod I for use of swallowing compensatory strategies. SLP Short Term Goal 2 (Week 1): Patient will demonstrate complex problem solving for functional tasks with supervision verbal cues. SLP Short Term Goal 3 (Week 1): Patient will recall new, daily information with supervision verbal cues.  Skilled Therapeutic Interventions: Skilled treatment session focused on cognitive goals. Upon arrival, patient was awake while upright in the chair but appeared uncomfortable. Patient reported he had a goal of staying up in the wheelchair for 30 minutes which he achieved and requested to get back into bed. Patient was transferred to bed via the [employer] with +2 assist. Patient was able to direct his care in regards to how to transfer safely, etc. Patient requested to use the urinal once in bed and was continent of bladder. Patient also requested to remove his shirt and shorts and put a gown on. Patient able to direct his care in how to safely remove his shirt with Min verbal cues. Patient's mother present and educated on current goals of skilled SLP intervention, she verbalized understanding. Patient left supine in bed with alarm on and all needs within reach. Continue with current plan of care.      Pain No/Denies Pain   Therapy/Group: Individual Therapy  Laurann Mcmorris 11/19/2019, 3:10 PM

## 2019-11-20 ENCOUNTER — Inpatient Hospital Stay (HOSPITAL_COMMUNITY): Payer: Managed Care, Other (non HMO) | Admitting: Physical Therapy

## 2019-11-20 ENCOUNTER — Inpatient Hospital Stay (HOSPITAL_COMMUNITY): Payer: Managed Care, Other (non HMO) | Admitting: Occupational Therapy

## 2019-11-20 ENCOUNTER — Inpatient Hospital Stay (HOSPITAL_COMMUNITY): Payer: Managed Care, Other (non HMO) | Admitting: Speech Pathology

## 2019-11-20 LAB — GLUCOSE, CAPILLARY
Glucose-Capillary: 161 mg/dL — ABNORMAL HIGH (ref 70–99)
Glucose-Capillary: 102 mg/dL — ABNORMAL HIGH (ref 70–99)
Glucose-Capillary: 92 mg/dL (ref 70–99)
Glucose-Capillary: 125 mg/dL — ABNORMAL HIGH (ref 70–99)

## 2019-11-20 NOTE — Consult Note (Signed)
Neuropsychological Consultation   Patient:   [name]   DOB:   [birth year]  MR Number:  [id number]  Location:  [facility] [id number] [place] [address] Dept: [place]: [phone]           Date of Service:   11/19/2019  Start Time:   3 PM End Time:   4 PM  Provider/Observer:  Ilean Skill, Psy.D.       Clinical Neuropsychologist       Billing Code/Service: [phone]  Chief Complaint:    [name] is a 44 year old right-handed male with a history of asthma, hypertension and pancreatitis.  Patient presented on 07/22/2019 with Covid pneumonia causing acute respiratory failure.  Patient had significant complications and eventually required intubation and later underwent bilateral chest tube placements.  Long-term intubation required trach completed 08/06/2019 that was downsized on 11/08/2019 and eventually decannulated.  PEG tube placement 09/11/2019.  Patient required long-term broad spectrum antibiotics.  Hospital course complicated by diffuse muscle weakness which was suspected to be critical illness myopathy.  Palliative care continues to follow to establish goals of care.  The patient with deep tissue pressure injuries to bilateral heels and lateral feet.  Patient has now been admitted to the comprehensive rehabilitation program.  Reason for Service:  Patient was referred for neuropsychological consultation due to coping and adjustment issues with long hospital stay and significant debility and weakness and continued expected long hospital course.  Below is the HPI for the current mission.  HPI: [name] is a 44 year old right-handed male with history of asthma, hypertension and pancreatitis.  Per chart review patient lives with spouse independent prior to admission.  1 level home with 4 steps to entry.  Initially presented 07/22/2019 with Covid pneumonia causing acute  respiratory failure.  He developed pneumomediastinum 12/23 with concern for possible bilateral small pneumothoraces 08/01/2019.  Patient initially placed on 10-day course of Decadron as well as REMDESIVIR and TOCILUZIMAB.  Patient required intubation and later underwent bilateral chest tube placements.  TEE 08/03/2019 ejection fraction 70% without emboli.  Long-term intubation required tracheostomy completed 08/06/2019 that has been downsized and decannulated 11/08/2019.  As well as PEG tube placement 09/11/2019 per Dr. Worthy Keeler.  He did require initial long-term broad-spectrum antibiotics.  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  Palliative care continues to follow to establish goals of care.  His diet has been advanced to regular thin liquids.  He was still receiving some nocturnal tube feeds for nutritional support.  Subcutaneous Lovenox for DVT prophylaxis.  Patient with deep tissue pressure injuries to bilateral heels and lateral feet followed by WOC with dressing changes as directed.  Therapy evaluations completed and patient was admitted for a comprehensive rehab program.  Current Status:  The patient was oriented but lethargic and appearing to be in considerable pain associated with his pressure wounds.  His wife was present for our visit and assisted him with movement and placing of various supports.  The patient is aware of what is happened to him medically but has little to no recall of events during his long hospitalization for severe Covid complications.  The patient reports that he has dealing with everything the best he can but he is worried about long-term complications and loss of function and wants to try to get as strong as it can before going home and needing assistance  from others.  Behavioral Observation: [name]  presents as a 44 y.o.-year-old Right Caucasian Male who appeared his stated age. his  dress was Appropriate and he was Well Groomed and his manners were Appropriate to the situation.  his participation was indicative of Appropriate and Inattentive behaviors.  There were any physical disabilities noted.  he displayed an appropriate level of cooperation and motivation.     Interactions:    Active Appropriate, Inattentive and Redirectable  Attention:   abnormal and attention span appeared shorter than expected for age  Memory:   abnormal; remote memory intact, recent memory impaired  Visuo-spatial:  not examined  Speech (Volume):  normal  Speech:   Slowed response style;   Thought Process:  Coherent and Relevant  Though Content:  WNL; not suicidal and not homicidal  Orientation:   person, place and time/date  Judgment:   Fair  Planning:   Poor  Affect:    Blunted  Mood:    Dysphoric  Insight:   Fair  Intelligence:   normal  Medical History:   Past Medical History:  Diagnosis Date  . Asthma   . GERD (gastroesophageal reflux disease)   . Headache   . History of kidney stones    LEFT URETERAL STONE  . HTN (hypertension)   . Pancreatitis 2018   GALLBALDDER SLUDGE CAUSED ISSUED RESOLVED     Psychiatric History:  No prior psychiatric history  Family Med/Psych History:  Family History  Problem Relation Age of Onset  . Asthma Brother   . Diabetes Father   . Hypertension Father   . Diabetes Paternal Grandmother   . Hypertension Paternal Grandmother   . Migraines Mother   . GER disease Mother   . Pancreatic cancer Maternal Grandmother   . Heart disease Maternal Grandfather     Impression/DX:  [name] is a 44 year old right-handed male with a history of asthma, hypertension and pancreatitis.  Patient presented on 07/22/2019 with Covid pneumonia causing acute respiratory failure.  Patient had significant complications and eventually required intubation and later underwent bilateral chest tube placements.  Long-term intubation required trach  completed 08/06/2019 that was downsized on 11/08/2019 and eventually decannulated.  PEG tube placement 09/11/2019.  Patient required long-term broad spectrum antibiotics.  Hospital course complicated by diffuse muscle weakness which was suspected to be critical illness myopathy.  Palliative care continues to follow to establish goals of care.  The patient with deep tissue pressure injuries to bilateral heels and lateral feet.  Patient has now been admitted to the comprehensive rehabilitation program.  The patient was oriented but lethargic and appearing to be in considerable pain associated with his pressure wounds.  His wife was present for our visit and assisted him with movement and placing of various supports.  The patient is aware of what is happened to him medically but has little to no recall of events during his long hospitalization for severe Covid complications.  The patient reports that he has dealing with everything the best he can but he is worried about long-term complications and loss of function and wants to try to get as strong as it can before going home and needing assistance from others.  Disposition/Plan:  Worked on coping and adjustment issues today and will follow up with the patient first of next week.  The patient is dealing with significant medical issues and debility/fatigue.  Diagnosis:    Critical illness myopathy - Plan: Ambulatory referral to Neurology  Electronically Signed   _______________________ Ilean Skill, Psy.D.

## 2019-11-20 NOTE — Progress Notes (Signed)
Occupational Therapy Session Note  Patient Details  Name: [name] MRN: [id number] Date of Birth: 02/15/1976  Today's Date: 11/20/2019 OT Individual Time: 3094-0768 OT Individual Time Calculation (min): 53 min    Short Term Goals: Week 1:  OT Short Term Goal 1 (Week 1): Pt will transfer to w/c in prep for [facility] transfer wiht MAX A +2 in prep for toileting OT Short Term Goal 2 (Week 1): Pt will roll in B directions with MAX A of 1 caregiver to decrease BOC during LB dressing OT Short Term Goal 3 (Week 1): seated upright, pt will bring L hand to mouth in prep for self feeding/grooming OT Short Term Goal 4 (Week 1): Pt will thread 1UE into shirt with S  Skilled Therapeutic Interventions/Progress Updates:    Treatment session with focus on bed mobility, management of BUE and BLE during rolling for self-care tasks, and functional use of LUE during bathing.  Pt received semi-reclined in bed reporting fatigue and pain in buttocks, but willing to engage in therapy session to tolerance.  Pt expressing desire to wash up.  RN arrived during session and provided pain meds.  Engaged in functional reaching with LUE to grasp wash cloth and utilize during bathing.  Pt able to wash chest and abdomen, but unable to reach to wash underarms or perineal area.  Required hand over hand assist to reach to face to wash, once on face pt able to wash without assistance.  Engaged in rolling with focus on initiation with bending knees, requiring total assist for rolling.  Pt able to maintain positioning in sidelying while therapist washed back side.  Repositioned in bed with focus on improved midline sitting and upright posture.  Utilized chair position in bed for increased upright tolerance and trunk control when donning clean gown and Rt wrist cock up splint.  Pt requested to return to semi-reclined due to pain and fatigue.  Pt reported need to urinate, required assistance with placement and holding of urinal as well as  hygiene when finished.   Therapy Documentation Precautions:  Precautions Precautions: Fall Precaution Comments: bilat wounds/feet, PEG Required Braces or Orthoses: Other Brace Other Brace: B prevalon boots; R wrist cock-up splint Restrictions Weight Bearing Restrictions: No RLE Weight Bearing: Non weight bearing LLE Weight Bearing: Non weight bearing Other Position/Activity Restrictions: to be clarified due to bilat wounds plantar and lateral aspect of feet Pain: Pain Assessment Pain Scale: 0-10 Pain Score: 10-Worst pain ever Pain Type: Acute pain Pain Location: Knee Pain Descriptors / Indicators: Throbbing Pain Onset: Gradual Pain Intervention(s): Medication (See eMAR)   Therapy/Group: Individual Therapy  Simonne Come 11/20/2019, 3:15 PM

## 2019-11-20 NOTE — Progress Notes (Signed)
Speech Language Pathology Daily Session Note  Patient Details  Name: [name] MRN: [id number] Date of Birth: 09-28-1975  Today's Date: 11/20/2019 SLP Individual Time: 1518-3437 SLP Individual Time Calculation (min): 50 min  Short Term Goals: Week 1: SLP Short Term Goal 1 (Week 1): Patient will consume current diet with minimal overt s/s of aspiration with Mod I for use of swallowing compensatory strategies. SLP Short Term Goal 2 (Week 1): Patient will demonstrate complex problem solving for functional tasks with supervision verbal cues. SLP Short Term Goal 3 (Week 1): Patient will recall new, daily information with supervision verbal cues.  Skilled Therapeutic Interventions: Skilled treatment session focused on dysphagia and cognitive goals. SLP facilitated session by providing skilled observation with breakfast meal of regular textures with thin liquids. Patient consumed meal without overt s/s of aspiration but demonstrated decreased PO intake this session. Patient reported he felt tired and"pukey" and did not feel like eating. Patient also recalled steps to slideboard transfer and how to appropriately transfer with therapy vs nursing independently. Secondary to patient's memory of functional information improving and tolerating regular textures without s/s of aspiration. Recommend patient's SLP plan of care change to 3X/week in order to maximize treatment time with other disciplines. Patient verbalized understanding and agreement. Patient left supine in bed with alarm on and all needs within reach. Continue with current plan of care.      Pain Pain Assessment Pain Scale: 0-10 Pain Score: 0-No pain Faces Pain Scale: No hurt  Therapy/Group: Individual Therapy  Nester Bachus 11/20/2019, 1:30 PM

## 2019-11-20 NOTE — Progress Notes (Signed)
Social Work Patient ID: [name], male   DOB: [birth year], 44 y.o.   MRN: [id number]   SW spoke with pt wife to provide updates from team conference, and estimated length of stay for 3 weeks. SW discussed pt current condition and the amount of support pt will require which is 24/7 care. Pt called her cousin while SW on the phone to discuss team conference updates. Family is considering SNF for pt since pt is dependent. Family intends to discuss further. Likely for pt to be SNF since family unable to provide 24/7 care.   Loralee Pacas, MSW, [place] Office: [phone] Cell: [phone] Fax: [phone]

## 2019-11-20 NOTE — Progress Notes (Signed)
Physical Therapy Session Note  Patient Details  Name: [name] MRN: [id number] Date of Birth: 12/21/1975  Today's Date: 11/20/2019 PT Individual Time: 1040-1120 PT Individual Time Calculation (min): 40 min   Short Term Goals: Week 1:  PT Short Term Goal 1 (Week 1): Pt will tolerate OOB x 2+ hrs PT Short Term Goal 2 (Week 1): Pt will verbalize steps/instruct therapist w/ set up for SBT PT Short Term Goal 3 (Week 1): Bed to/from wc w/max assist of 2 PT Short Term Goal 4 (Week 1): Pt will roll w/max assist of 1 and cues using bed features  Skilled Therapeutic Interventions/Progress Updates:   Pt received supine in bed and agreeable to PT. RN present to re-dress BLE foot wounds PT assisted by holding BLE to allow adequate observation of wounds and proper placement of dressing. Pt noted to be very lethargic this AM, with difficulty keeping eyes open and falling asleep when not engaged in converstation. Given pt's increased lethargy, PT decided to perfom therapy at bed level. UE: chest press scapular retraction x 10 BUE. D1 UE Scaption x 10 BUE with AAROM into shoulder ER/flexion RUE>LUE. Hip/knee flexion/extension against manual resistance. Hip abduction/adduction x 12.  Prolonged rest breaks between bouts and pt noted to Continue to fall alseep when not engaged in conversation. Left in bed with call bell in reach and CSW present.          Therapy Documentation Precautions:  Precautions Precautions: Fall Precaution Comments: bilat wounds/feet, PEG Required Braces or Orthoses: Other Brace Other Brace: B prevalon boots; R wrist cock-up splint Restrictions Weight Bearing Restrictions: No RLE Weight Bearing: Non weight bearing LLE Weight Bearing: Non weight bearing Other Position/Activity Restrictions: to be clarified due to bilat wounds plantar and lateral aspect of feet Pain: denies  Therapy/Group: Individual Therapy  Lorie Phenix 11/20/2019, 11:38 AM

## 2019-11-20 NOTE — Progress Notes (Signed)
[place] PHYSICAL MEDICINE & REHABILITATION PROGRESS NOTE   Subjective/Complaints:   Pt needs to get on bedpan "now". Needs to go.  Sounds like needs to have BM.  Per wound care nurse, got off ~1/3 of thick eschar on B/L feet.     ROS:  Pt denies SOB, abd pain, CP, N/V/C/D, and vision changes   Objective:   No results found. Recent Labs    11/19/19 0529  WBC 7.4  HGB 11.4*  HCT 36.7*  PLT 186   Recent Labs    11/19/19 0529  NA 135  K 4.3  CL 95*  CO2 29  GLUCOSE 135*  BUN 40*  CREATININE 0.83  CALCIUM 9.5    Intake/Output Summary (Last 24 hours) at 11/20/2019 0846 Last data filed at 11/20/2019 0723 Gross per 24 hour  Intake 777 ml  Output 1350 ml  Net -573 ml     Physical Exam: Vital Signs Blood pressure 128/79, pulse 77, temperature 98.5 F (36.9 C), temperature source Oral, resp. rate 19, weight 118.4 kg, SpO2 96 %.  Constitutional:sitting up in bed; anxious, NAD Cardiovascular:RRR- no JVD Respiratory: coarse breath sounds; throughout; no Wheezing; good air movement; O2 by [place] 1L GI: soft, NT, ND, (+)BS Musculoskeletal:     Cervical back: Normal range of motion and neck supple.     Comments: RUE- delt 2-/5, biceps 4-/5, tricep 3/5, WE 3/5, grip 3/5, finger 2/5 LUE- delt 2-/5, biceps 2/5, triceps 3/5, WE 1/5, grip 2+/5, finger 1/5 LEs B/L- HF 2-/5, KE and KF 2/5, DF 1/5; PF 2/5 Neurological:Ox3 Pt reports sensation decreased in L thumb and RUE; normal in LEs to light touch No hoffman's or clonus B/L  Skin:  Has B/L anterior calf scrapes of skin- covered with dressings B/L- superficial scrapes/no sign of infection.  Bottom of feet have a thick black eschar 1/3 of it peeled off- ~ 2-92m thick  half of feet B/L ( wound care assessed today as well) Psychiatric:  Very anxious after discussed SNF  Assessment/Plan: 1. 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  Physiatrist is providing close team supervision and 24 hour management of active medical problems listed below.  Physiatrist and rehab team continue to assess barriers to discharge/monitor patient progress toward functional and medical goals  Care Tool:  Bathing        Body parts bathed by helper: Right arm, Left arm, Chest, Abdomen, Front perineal area, Buttocks, Right upper leg, Left upper leg, Right lower leg, Left lower leg, Face     Bathing assist Assist Level: 2 Helpers     Upper Body Dressing/Undressing Upper body dressing   What is the patient wearing?: Pull over shirt    Upper body assist Assist Level: Total Assistance - Patient < 25%    Lower Body Dressing/Undressing Lower body dressing      What is the patient wearing?: Pants     Lower body assist Assist for lower body dressing: Dependent - Patient 0%     Toileting Toileting    Toileting assist Assist for toileting: Dependent - Patient 0%     Transfers Chair/bed transfer  Transfers assist     Chair/bed transfer assist level: 2 Helpers     Locomotion Ambulation   Ambulation assist   Ambulation activity did not occur: Safety/medical concerns          Walk 10 feet activity   Assist  Walk  10 feet activity did not occur: Safety/medical concerns        Walk 50 feet activity   Assist Walk 50 feet with 2 turns activity did not occur: Safety/medical concerns         Walk 150 feet activity   Assist Walk 150 feet activity did not occur: Safety/medical concerns         Walk 10 feet on uneven surface  activity   Assist Walk 10 feet on uneven surfaces activity did not occur: Safety/medical concerns         Wheelchair     Assist Will patient use wheelchair at discharge?: Yes Type of Wheelchair: Manual    Wheelchair assist level: Minimal Assistance - Patient > 75% Max wheelchair distance: 50(w/mult rest breaks)    Wheelchair 50 feet  with 2 turns activity    Assist    Wheelchair 50 feet with 2 turns activity did not occur: Safety/medical concerns(unable to turn wc)       Wheelchair 150 feet activity     Assist  Wheelchair 150 feet activity did not occur: Safety/medical concerns       Blood pressure 128/79, pulse 77, temperature 98.5 F (36.9 C), temperature source Oral, resp. rate 19, weight 118.4 kg, SpO2 96 %.  Medical Problem List and Plan: 1.  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              -ELOS/Goals: 3-4 weeks- goals max assist- severely impaired  -Continue CIR PT, OT, SLP 2.  Antithrombotics: -DVT/anticoagulation: Lovenox 60 mg daily.  Vascular study negative             -antiplatelet therapy: N/A 3. Pain Management: Dilaudid 2 mg every 6 hours as needed pain 4. Mood: Melatonin 6 mg nightly, trazodone 100 mg nightly, Klonopin 1 mg nightly, Lexapro 20 mg daily, Valium 2.5 mg every 12 hours as needed anxiety  4/9- will try to get Neuropsych for pt due to anxiety.   4/10: Did not use valium today  4/12- neuropsych on schedule per SW             -antipsychotic agents: N/A 5. Neuropsych: This patient is capable of making decisions on his own behalf. 6. Skin/Wound/ Care/deep tissue pressure injury bilateral heels and lateral feet: Wound care as directed  4/11: Ordered betadine dressings for bilateral feet as he was receiving in acute care.   4/14- consulted WOC: they asked for debridement- consulted Dr Sharol Given.  7. Fluids/Electrolytes/Nutrition: Strict INO's with follow-up  chemistries  4/9- labs look good except BUN still 46- is dry? But is stable/better than was 1 week ago- was 51.  4/12- will recheck labs in AM   4/13- BUN down to 40- doing better- con't to monitor weekly.  8.  Dysphagia.  Status post PEG tube 09/11/2019 per Dr. Worthy Keeler.  Diet has been advanced to regular.  Patient is still receiving nocturnal tube feeds by PEG tube.  4/9- doing calorie count, to see if  can stop TFs  4/13- cannot stop TFs per dietician, since pt has high protein/calorie needs to aid healing, and is Dependent on staff to feed him everything.  9.  Status post tracheostomy 08/06/2019.  Decannulated 11/08/2019. 10.  Hypertension.  Norvasc 10 mg daily, Coreg 25 mg twice daily, Aldactone 25 mg daily.  Monitor with increased mobility  4/9-4/10 BP well controlled- continue regimen. Addendum: Hypotensive to 106/49 at night; decreased amlodipine dose to 21m.   4/13- BP 134/69 this AM- doing better with decreased Norvasc  4/14- Pt's BP 128/70 this AM- doing well cont decreased Norvasc 11.  History of asthma.  Continue nebulizers as directed 12.  GERD.  Protonix 13.  BPH.  Flomax 0.4 mg daily. 14. Morbid obesity- current BMI of 40- will discuss diet, etc once more appropriate.  15. Insomnia- con't meds as above  4/9-4/10 sleeps fine- is his normal amount of sleep  4/12- doesn't sleep well, but is chronic- on Klonopin AND trazodone 100 mg QHS 16. Right ear wax: Debrox ear drops    LOS: 6 days A FACE TO FACE EVALUATION WAS PERFORMED  Xan Ingraham 11/20/2019, 8:46 AM

## 2019-11-20 NOTE — Plan of Care (Signed)
  Problem: Consults Goal: RH GENERAL PATIENT EDUCATION Description: See Patient Education module for education specifics. Outcome: Progressing   Problem: RH BOWEL ELIMINATION Goal: RH STG MANAGE BOWEL WITH ASSISTANCE Description: STG Manage Bowel with min Assistance. Outcome: Progressing   Problem: RH BLADDER ELIMINATION Goal: RH STG MANAGE BLADDER WITH ASSISTANCE Description: STG Manage Bladder With min Assistance Outcome: Progressing   Problem: RH SKIN INTEGRITY Goal: RH STG SKIN FREE OF INFECTION/BREAKDOWN Description: Skin will be free of infection/breakdown with mod assistance Outcome: Progressing

## 2019-11-20 NOTE — Consult Note (Signed)
[address] Nurse wound follow up Patient receiving care in [facility] 4W11. Wound type: Unstageable PIs to bilateral plantar feet Measurement: Each area of eschar measures 17 cm x 5 cm and the eschar that remains is densely adherent. Wound bed: Black eschar Drainage (amount, consistency, odor) none Periwound: thickened, yellow skin, dry, flaking Dressing procedure/placement/frequency: Continue with iodine application to eschar and margin of eschar.  I have recommended considerations of consult to possibly podiatry services or orthopedic services to get their input on possible complete debridement of the eschar on the feet.  This conversation was held with Dr. Dagoberto Ligas and [place]. Val Riles, RN, MSN, CWOCN, CNS-BC, pager [phone]

## 2019-11-21 ENCOUNTER — Inpatient Hospital Stay (HOSPITAL_COMMUNITY): Payer: Managed Care, Other (non HMO) | Admitting: Physical Therapy

## 2019-11-21 ENCOUNTER — Inpatient Hospital Stay (HOSPITAL_COMMUNITY): Payer: Managed Care, Other (non HMO) | Admitting: Speech Pathology

## 2019-11-21 ENCOUNTER — Inpatient Hospital Stay (HOSPITAL_COMMUNITY): Payer: Managed Care, Other (non HMO) | Admitting: Occupational Therapy

## 2019-11-21 LAB — GLUCOSE, CAPILLARY
Glucose-Capillary: 129 mg/dL — ABNORMAL HIGH (ref 70–99)
Glucose-Capillary: 111 mg/dL — ABNORMAL HIGH (ref 70–99)
Glucose-Capillary: 93 mg/dL (ref 70–99)
Glucose-Capillary: 111 mg/dL — ABNORMAL HIGH (ref 70–99)

## 2019-11-21 LAB — CREATININE, SERUM
Creatinine, Ser: 0.81 mg/dL (ref 0.61–1.24)
GFR calc Af Amer: >60 mL/min (ref >60)
GFR calc non Af Amer: >60 mL/min (ref >60)

## 2019-11-21 NOTE — Progress Notes (Signed)
Occupational Therapy Session Note  Patient Details  Name: [name] MRN: [id number] Date of Birth: [birth year]  Today's Date: 11/21/2019 OT Individual Time: 1115-1208 OT Individual Time Calculation (min): 53 min    Short Term Goals: Week 1:  OT Short Term Goal 1 (Week 1): Pt will transfer to w/c in prep for [name] transfer wiht MAX A +2 in prep for toileting OT Short Term Goal 2 (Week 1): Pt will roll in B directions with MAX A of 1 caregiver to decrease BOC during LB dressing OT Short Term Goal 3 (Week 1): seated upright, pt will bring L hand to mouth in prep for self feeding/grooming OT Short Term Goal 4 (Week 1): Pt will thread 1UE into shirt with S  Skilled Therapeutic Interventions/Progress Updates:    Patient in bed, states that he was OOB earlier today and would like to stay in bed for activities this session.   Dependent for use of bed pan, hygiene and LB bathing/dressing.  Rolling improved to min A.  UB bathing with min A, max A for hospital gown.  Note improved grasp and reach with bilateral UEs at bed level.  Reviewed eating ability and deficit areas.  Provided foam for built up handles, reviewed supporting elbows for improved reach.  Provided dycem.  Provided modular extension arm to hold phone - need to practice ideal positioning.  Reviewed and completed UB exercises and activities that he can complete on his own - provided foam block for grip strengthening.  He remained in bed at close of session, call bell in hand.     Therapy Documentation Precautions:  Precautions Precautions: Fall Precaution Comments: bilat wounds/feet, PEG Required Braces or Orthoses: Other Brace Other Brace: B prevalon boots; R wrist cock-up splint Restrictions Weight Bearing Restrictions: No RLE Weight Bearing: Non weight bearing LLE Weight Bearing: Non weight bearing Other Position/Activity Restrictions: to be clarified due to bilat wounds plantar and lateral aspect of feet  Therapy/Group:  Individual Therapy  Carlos Levering 11/21/2019, 7:41 AM

## 2019-11-21 NOTE — Progress Notes (Signed)
Nutrition Follow-up  DOCUMENTATION CODES:   Obesity unspecified  INTERVENTION:  -ContinueEnsure Enlive poTID, each supplement provides 350 kcal and 20 grams of protein -Feeding assistance with each meal and supplement  Continuenocturnal tube feeding:  -Osmolite 1.5 @ 75 ml/hr via PEG x 12hrs (1800-0600) -60 ml Prostat QID -Juven BID   At goal TF provides:2310kcal, 181grams protein, and 634m free water. Meets 76% kcal needs and100% of protein needs.   Pt with increased nutrient needs to aid in wound healing. It will be difficult for pt to receive enough po to meet these needs, especially considering his need for feeding assistance. RD would not recommend discontinuing nocturnal tube feeding at this time.   NUTRITION DIAGNOSIS:   Increased nutrient needs related to wound healing as evidenced by estimated needs.  Ongoing.  GOAL:   Patient will meet greater than or equal to 90% of their needs  Progressing.  MONITOR:   PO intake, Supplement acceptance, TF tolerance, Labs, I & O's  REASON FOR ASSESSMENT:   Consult Enteral/tube feeding initiation and management, Calorie Count  ASSESSMENT:   Pt with a PMH significant for asthma, HTN and pancreatitis. Pt initially presented 07/22/2019 with Covid PNA causing acute respiratory failure. Pt developed pneumomediastinum 12/23 with concern for possible bilateral small pneumothoraces 08/01/2019. Pt required intubation and later underwent bilateral chest tube placements.  TEE 08/03/2019 ejection fraction 70% without emboli.  Long-term intubation required tracheostomy completed 08/06/2019 that has been downsized and decannulated 11/08/2019. PEG tube placement 09/11/2019. 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 Diet has been advanced to regular thin liquids; pt still receiving some nocturnal tube feeds for nutritional support. Admitted to CIR on  4/8.  Pt reports appetite is good, but states he is getting tired of the food. Pt reports drinking 100% of his Ensures.  Of note, pt's calorie and protein needs are still significantly elevated due to wound healing and it will be difficult for the patient to consume enough PO to help meet needs.   WOC following.   PO intake: 25-100% x last 8 recorded meals (56% average intake)  Current tube feeding regimen: Osmolite 1.5 cal @ 79mhr via PEG from 1800-0600, 6030mro-stat QID  UOP: 1,900m39m4 hours I/O: -6,321.8ml 19mce admit  Labs reviewed: CBGs 111-129 Medications reviewed and include: Vitamin C, Ensure Enlive TID, Novolog, Juven BID, Fibercon, Metamucil, Aldactone   Diet Order:   Diet Order            Diet regular Room service appropriate? Yes with Assist; Fluid consistency: Thin  Diet effective now              EDUCATION NEEDS:   No education needs have been identified at this time  Skin:  Skin Integrity Issues:: DTI, Unstageable DTI: bilateral heels Unstageable: L pinky toe  Last BM:  4/15  Height:   Ht Readings from Last 1 Encounters:  08/09/19 5' 8"  (1.727 m)    Weight:   Wt Readings from Last 1 Encounters:  11/21/19 115.8 kg    BMI:  Body mass index is 38.82 kg/m.  Estimated Nutritional Needs:   Kcal:  [id number] kcal/kg)  Protein:  180-215 grams  Fluid:  >/= 2.5 L/day   AmandLarkin Ina RD, LDN RD pager number and weekend/on-call pager number located in [facility] [place]

## 2019-11-21 NOTE — Progress Notes (Signed)
Speech Language Pathology Daily Session Note  Patient Details  Name: [name] MRN: [id number] Date of Birth: Mar 11, 1976  Today's Date: 11/21/2019 SLP Individual Time: 0830-0900 SLP Individual Time Calculation (min): 30 min  Short Term Goals: Week 1: SLP Short Term Goal 1 (Week 1): Patient will consume current diet with minimal overt s/s of aspiration with Mod I for use of swallowing compensatory strategies. SLP Short Term Goal 2 (Week 1): Patient will demonstrate complex problem solving for functional tasks with supervision verbal cues. SLP Short Term Goal 3 (Week 1): Patient will recall new, daily information with supervision verbal cues.  Skilled Therapeutic Interventions: Skilled treatment session focused on dysphagia and cognitive goals. SLP facilitated session by providing skilled observation with breakfast meal of regular textures with thin liquids. Patient demonstrated overt cough X 1, suspect due to dryness of eggs. Recommend patient continue current diet. Patient also recalled events from previous therapy sessions with overall Mod I. Overall, patient was much more engaged and socially interactive this session. Patient left upright in bed with alarm on and all needs within reach. Continue with current plan of care.      Pain Pain Assessment Pain Scale: 0-10 Pain Score: 5   Therapy/Group: Individual Therapy  Thelda Gagan 11/21/2019, 12:03 PM

## 2019-11-21 NOTE — Progress Notes (Signed)
[place] PHYSICAL MEDICINE & REHABILITATION PROGRESS NOTE   Subjective/Complaints:   Pt reports some "male doctor" came and saw him very early this AM- doesn't remember what occurred and what the plan is.   Bowels OK-no constipation  said went without O2 when ate breakfast and occ otherwise- doesn't get SOB.    ROS:  Pt denies SOB, abd pain, CP, N/V/C/D, and vision changes   Objective:   No results found. Recent Labs    11/19/19 0529  WBC 7.4  HGB 11.4*  HCT 36.7*  PLT 186   Recent Labs    11/19/19 0529 11/21/19 0459  NA 135  --   K 4.3  --   CL 95*  --   CO2 29  --   GLUCOSE 135*  --   BUN 40*  --   CREATININE 0.83 0.81  CALCIUM 9.5  --     Intake/Output Summary (Last 24 hours) at 11/21/2019 1031 Last data filed at 11/21/2019 0551 Gross per 24 hour  Intake 360 ml  Output 1575 ml  Net -1215 ml     Physical Exam: Vital Signs Blood pressure 112/62, pulse 75, temperature 98.2 F (36.8 C), temperature source Oral, resp. rate 18, weight 115.8 kg, SpO2 94 %.  Constitutional:sitting up in bed; finished all breakfast, nurse at bedside, slightly less anxious; NAD Cardiovascular:RRR_ no JVD Respiratory: CTA B/L- no W/R/R, good air movement- O2 by [place] 1L GI: soft, NT, ND, (+)BS Musculoskeletal:     Cervical back: Normal range of motion and neck supple.     Comments: RUE- delt 2-/5, biceps 4-/5, tricep 3/5, WE 3/5, grip 3/5, finger 2/5 LUE- delt 2-/5, biceps 2/5, triceps 3/5, WE 1/5, grip 2+/5, finger 1/5 LEs B/L- HF 2-/5, KE and KF 2/5, DF 1/5; PF 2/5 Neurological:Ox3 Pt reports sensation decreased in L thumb and RUE; normal in LEs to light touch No hoffman's or clonus B/L  Skin:  Has B/L anterior calf scrapes of skin- covered with dressings B/L- superficial scrapes/no sign of infection.  Bottom of feet have a thick black eschar 1/3 of it peeled off- ~ 2-27m thick  half of feet B/L- reassessed- no change Psychiatric:  Slightly less  anxious  Assessment/Plan: 1. 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  Physiatrist is providing close team supervision and 24 hour management of active medical problems listed below.  Physiatrist and rehab team continue to assess barriers to discharge/monitor patient progress toward functional and medical goals  Care Tool:  Bathing        Body parts bathed by helper: Right arm, Left arm, Chest, Abdomen, Front perineal area, Buttocks, Right upper leg, Left upper leg, Right lower leg, Left lower leg, Face     Bathing assist Assist Level: 2 Helpers     Upper Body Dressing/Undressing Upper body dressing   What is the patient wearing?: Pull over shirt    Upper body assist Assist Level: Total Assistance - Patient < 25%    Lower Body Dressing/Undressing Lower body dressing      What is the patient wearing?: Pants     Lower body assist Assist for lower body dressing: Dependent - Patient 0%     Toileting Toileting    Toileting assist Assist for toileting: Dependent - Patient 0%     Transfers Chair/bed transfer  Transfers assist     Chair/bed transfer assist level: 2 Helpers  Locomotion Ambulation   Ambulation assist   Ambulation activity did not occur: Safety/medical concerns          Walk 10 feet activity   Assist  Walk 10 feet activity did not occur: Safety/medical concerns        Walk 50 feet activity   Assist Walk 50 feet with 2 turns activity did not occur: Safety/medical concerns         Walk 150 feet activity   Assist Walk 150 feet activity did not occur: Safety/medical concerns         Walk 10 feet on uneven surface  activity   Assist Walk 10 feet on uneven surfaces activity did not occur: Safety/medical concerns         Wheelchair     Assist Will patient use wheelchair at discharge?: Yes Type of  Wheelchair: Manual    Wheelchair assist level: Minimal Assistance - Patient > 75% Max wheelchair distance: 50(w/mult rest breaks)    Wheelchair 50 feet with 2 turns activity    Assist    Wheelchair 50 feet with 2 turns activity did not occur: Safety/medical concerns(unable to turn wc)       Wheelchair 150 feet activity     Assist  Wheelchair 150 feet activity did not occur: Safety/medical concerns       Blood pressure 112/62, pulse 75, temperature 98.2 F (36.8 C), temperature source Oral, resp. rate 18, weight 115.8 kg, SpO2 94 %.  Medical Problem List and Plan: 1.  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              -ELOS/Goals: 3-4 weeks- goals max assist- severely impaired  -Continue CIR PT, OT, SLP 2.  Antithrombotics: -DVT/anticoagulation: Lovenox 60 mg daily.  Vascular study negative             -antiplatelet therapy: N/A 3. Pain Management: Dilaudid 2 mg every 6 hours as needed pain 4. Mood: Melatonin 6 mg nightly, trazodone 100 mg nightly, Klonopin 1 mg nightly, Lexapro 20 mg daily, Valium 2.5 mg every 12 hours as needed anxiety  4/9- will try to get Neuropsych for pt due to anxiety.   4/10: Did not use valium today  4/12- neuropsych on schedule per SW             -antipsychotic agents: N/A 5. Neuropsych: This patient is capable of making decisions on his own behalf. 6. Skin/Wound/ Care/deep tissue pressure injury bilateral heels and lateral feet: Wound care as directed  4/11: Ordered betadine dressings for bilateral feet as he was receiving  in acute care.   4/14- consulted WOC: they asked for debridement- consulted Dr Sharol Given.  4/15- sounds like Dr Sharol Given came- no note yet.   7. Fluids/Electrolytes/Nutrition: Strict INO's with follow-up chemistries  4/9- labs look good except BUN still 46- is dry? But is stable/better than was 1 week ago- was 51.  4/12- will recheck labs in AM   4/13- BUN down to 40- doing better-  con't to monitor weekly.  8.  Dysphagia.  Status post PEG tube 09/11/2019 per Dr. Worthy Keeler.  Diet has been advanced to regular.  Patient is still receiving nocturnal tube feeds by PEG tube.  4/9- doing calorie count, to see if can stop TFs  4/13- cannot stop TFs per dietician, since pt has high protein/calorie needs to aid healing, and is Dependent on staff to feed him everything.  9.  Status post tracheostomy 08/06/2019.  Decannulated 11/08/2019. 10.  Hypertension.  Norvasc 10 mg daily, Coreg 25 mg twice daily, Aldactone 25 mg daily.  Monitor with increased mobility  4/9-4/10 BP well controlled- continue regimen. Addendum: Hypotensive to 106/49 at night; decreased amlodipine dose to 33m.   4/13- BP 134/69 this AM- doing better with decreased Norvasc  4/14- Pt's BP 128/70 this AM- doing well cont decreased Norvasc  4/15- BP well cotnrolled- con't meds 11.  History of asthma.  Continue nebulizers as directed  4/15- will con't to wean O2- is at 1L currently 12.  GERD.  Protonix 13.  BPH.  Flomax 0.4 mg daily. 14. Morbid obesity- current BMI of 40- will discuss diet, etc once more appropriate.  4/15- BMI down to 38.8- improving  15. Insomnia- con't meds as above  4/9-4/10 sleeps fine- is his normal amount of sleep  4/12- doesn't sleep well, but is chronic- on Klonopin AND trazodone 100 mg QHS 16. Right ear wax: Debrox ear drops    LOS: 7 days A FACE TO FACE EVALUATION WAS PERFORMED  [name] 11/21/2019, 10:31 AM

## 2019-11-21 NOTE — Progress Notes (Signed)
Physical Therapy Session Note  Patient Details  Name: [name] MRN: [id number] Date of Birth: 09-Sep-1975  Today's Date: 11/21/2019 PT Individual Time: 0922-1015 PT Individual Time Calculation (min): 53 min   Short Term Goals: Week 1:  PT Short Term Goal 1 (Week 1): Pt will tolerate OOB x 2+ hrs PT Short Term Goal 2 (Week 1): Pt will verbalize steps/instruct therapist w/ set up for SBT PT Short Term Goal 3 (Week 1): Bed to/from wc w/max assist of 2 PT Short Term Goal 4 (Week 1): Pt will roll w/max assist of 1 and cues using bed features  Skilled Therapeutic Interventions/Progress Updates:   Pt presents sitting in bed being fed breakfast.  Pt agreeable to participate in therapy.  Pt performed log roll to left and max A for sup to sit w/ verbal and visual cueing for hand placement to use siderail.. Pt assisted to sitting EOB, but able to maintain seated balance w/ bilateral UE support and platform.  Pt requested shorts before agreeable to transfer to TIS.  Pt required max A for sit to supine, although shorts started when sitting and pt able to pull up as high as can.  Pt then rolled to right w/ mod A and required total assist for shorts over hips, although able to hold rail to assist in maintaining side-lying, and then to opposite side .  Pt then transferred sup to sit and maintained sitting EOB w/ UE support.  Pt leaned to left, and total assist for placement of slide board.  Pt required 2 person assist for sequential slide board transfer to TIS.  Pt tilted back and performed LE there ex including LAQ, isometric add, and GS.  Pt remained in TIS w/c w/ all needs in place, feet on foot rests w/ towel padding for comfort.  Nursing aware of need to transfer BTB w/ lift, but will remain up for approx. 1 hour until OT rx.     Therapy Documentation Precautions:  Precautions Precautions: Fall Precaution Comments: bilat wounds/feet, PEG Required Braces or Orthoses: Other Brace Other Brace: B  prevalon boots; R wrist cock-up splint Restrictions Weight Bearing Restrictions: No RLE Weight Bearing: Non weight bearing LLE Weight Bearing: Non weight bearing Other Position/Activity Restrictions: to be clarified due to bilat wounds plantar and lateral aspect of feet General:   Vital Signs: Therapy Vitals Temp: 98.2 F (36.8 C) Temp Source: Oral Pulse Rate: 75 Resp: 18 BP: 112/62 Patient Position (if appropriate): Lying Oxygen Therapy SpO2: 94 % O2 Device: Nasal Cannula Pain: 5/10 to back/hips.  Pt received pain meds during rx. Pain Assessment Pain Scale: 0-10 Pain Score: 5  Pain Type: Acute pain Pain Location: Knee Pain Orientation: Left;Right;Anterior Pain Descriptors / Indicators: Throbbing Pain Frequency: Occasional Pain Intervention(s): Repositioned Mobility:      Therapy/Group: Individual Therapy  [name] 11/21/2019, 10:20 AM

## 2019-11-21 NOTE — Progress Notes (Signed)
Social Work Patient ID: [name], male   DOB: Dec 19, 1975, 44 y.o.   MRN: [id number]    SW received call from [place] [phone] ext [phone]/f:[phone]) stating she received the fax, and pt is covered through 4/21 with updates due on 4/21 or 4/22.   Loralee Pacas, MSW, [place] Office: [phone] Cell: [phone] Fax: [phone]

## 2019-11-22 ENCOUNTER — Inpatient Hospital Stay (HOSPITAL_COMMUNITY): Payer: Managed Care, Other (non HMO) | Admitting: Speech Pathology

## 2019-11-22 ENCOUNTER — Inpatient Hospital Stay (HOSPITAL_COMMUNITY): Payer: Managed Care, Other (non HMO)

## 2019-11-22 ENCOUNTER — Inpatient Hospital Stay (HOSPITAL_COMMUNITY): Payer: Managed Care, Other (non HMO) | Admitting: Occupational Therapy

## 2019-11-22 ENCOUNTER — Encounter (HOSPITAL_COMMUNITY): Payer: Managed Care, Other (non HMO) | Admitting: Psychology

## 2019-11-22 ENCOUNTER — Inpatient Hospital Stay (HOSPITAL_COMMUNITY): Payer: Managed Care, Other (non HMO) | Admitting: Physical Therapy

## 2019-11-22 DIAGNOSIS — G7281 Critical illness myopathy: Secondary | ICD-10-CM | POA: Diagnosis not present

## 2019-11-22 LAB — GLUCOSE, CAPILLARY
Glucose-Capillary: 140 mg/dL — ABNORMAL HIGH (ref 70–99)
Glucose-Capillary: 86 mg/dL (ref 70–99)
Glucose-Capillary: 91 mg/dL (ref 70–99)
Glucose-Capillary: 155 mg/dL — ABNORMAL HIGH (ref 70–99)

## 2019-11-22 NOTE — Progress Notes (Signed)
[facility] PHYSICAL MEDICINE & REHABILITATION PROGRESS NOTE   Subjective/Complaints:   No note placed by Dr Sharol Given yesterday or today- not sure if he came ot see pt or not.  Pt reports nursing didn't work on weaning O2 yesterday- said had a coughing/ewheezing episode yesterday that lasted ~30 + minutes- that might be why- didn't get any albuterol/meds to fix- resolved spontaneously.   Bowels working daily.   LBM this AM  ROS:  Pt denies SOB, abd pain, CP, N/V/C/D, and vision changes   Objective:   No results found. No results for input(s): WBC, HGB, HCT, PLT in the last 72 hours. Recent Labs    11/21/19 0459  CREATININE 0.81    Intake/Output Summary (Last 24 hours) at 11/22/2019 1614 Last data filed at 11/22/2019 1300 Gross per 24 hour  Intake 550 ml  Output 1900 ml  Net -1350 ml     Physical Exam: Vital Signs Blood pressure 135/69, pulse 85, temperature 99 F (37.2 C), resp. rate 14, weight 116.2 kg, SpO2 96 %.  Constitutional:sitting up in bed; done with breakfast, wearing O2 by [place], NAD Cardiovascular: RRR Respiratory: CTA b/l- no W/R/R no coarseness- O2 by [place] 1L still GI: protuberant, soft, NT, ND, (+)BS Musculoskeletal:     Cervical back: Normal range of motion and neck supple.     Comments: RUE- delt 2-/5, biceps 4-/5, tricep 3/5, WE 3/5, grip 3/5, finger 2/5 LUE- delt 2-/5, biceps 2/5, triceps 3/5, WE 1/5, grip 2+/5, finger 1/5 LEs B/L- HF 2-/5, KE and KF 2/5, DF 1/5; PF 2/5 Neurological:Ox3 Pt reports sensation decreased in L thumb and RUE; normal in LEs to light touch No hoffman's or clonus B/L  Skin:  Has B/L anterior calf scrapes of skin- covered with dressings B/L- superficial scrapes/no sign of infection.  Bottom of feet have a thick black eschar 1/3 of it peeled off- ~ 2-17m thick  half of feet B/L-reassessed- no change seen Psychiatric:  Brighter affect- talking about SNF plans  Assessment/Plan: 1. 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  Physiatrist is providing close team supervision and 24 hour management of active medical problems listed below.  Physiatrist and rehab team continue to assess barriers to discharge/monitor patient progress toward functional and medical goals  Care Tool:  Bathing    Body parts bathed by patient: Abdomen   Body parts bathed by helper: Right arm, Left arm, Chest, Front perineal area, Buttocks, Right upper leg, Left upper leg, Right lower leg, Left lower leg, Face     Bathing assist Assist Level: Total Assistance - Patient < 25%     Upper Body Dressing/Undressing Upper body dressing   What is the patient wearing?: Pull over shirt    Upper body assist Assist Level: Total Assistance - Patient < 25%    Lower Body Dressing/Undressing Lower body dressing      What is the patient wearing?: Pants, Incontinence brief     Lower body assist Assist for lower body dressing: Total Assistance - Patient < 25%     Toileting Toileting    Toileting assist Assist for toileting: Dependent - Patient 0%     Transfers Chair/bed transfer  Transfers assist     Chair/bed transfer assist level: 2 Helpers     Locomotion Ambulation   Ambulation assist   Ambulation activity did not occur: Safety/medical concerns          Walk  10 feet activity   Assist  Walk 10 feet activity did not occur: Safety/medical concerns        Walk 50 feet activity   Assist Walk 50 feet with 2 turns activity did not occur: Safety/medical concerns         Walk 150 feet activity   Assist Walk 150 feet activity did not occur: Safety/medical concerns         Walk 10 feet on uneven surface  activity   Assist Walk 10 feet on uneven surfaces activity did not occur: Safety/medical concerns         Wheelchair     Assist Will patient use wheelchair at discharge?: Yes Type of  Wheelchair: Manual    Wheelchair assist level: Minimal Assistance - Patient > 75% Max wheelchair distance: 50(w/mult rest breaks)    Wheelchair 50 feet with 2 turns activity    Assist    Wheelchair 50 feet with 2 turns activity did not occur: Safety/medical concerns(unable to turn wc)       Wheelchair 150 feet activity     Assist  Wheelchair 150 feet activity did not occur: Safety/medical concerns       Blood pressure 135/69, pulse 85, temperature 99 F (37.2 C), resp. rate 14, weight 116.2 kg, SpO2 96 %.  Medical Problem List and Plan: 1.  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              -ELOS/Goals: 3-4 weeks- goals max assist- severely impaired  -Continue CIR PT, OT, SLP 2.  Antithrombotics: -DVT/anticoagulation: Lovenox 60 mg daily.  Vascular study negative             -antiplatelet therapy: N/A 3. Pain Management: Dilaudid 2 mg every 6 hours as needed pain 4. Mood: Melatonin 6 mg nightly, trazodone 100 mg nightly, Klonopin 1 mg nightly, Lexapro 20 mg daily, Valium 2.5 mg every 12 hours as needed anxiety  4/9- will try to get Neuropsych for pt due to anxiety.   4/10: Did not use valium today  4/12- neuropsych on schedule per SW  4/16- saw Neuropsych today- discussed SNF planning.              -antipsychotic agents: N/A 5. Neuropsych: This patient is capable of making decisions on his own behalf. 6. Skin/Wound/ Care/deep tissue pressure injury bilateral heels and lateral feet: Wound care as directed  4/11: Ordered betadine dressings for  bilateral feet as he was receiving in acute care.   4/14- consulted [facility]: they asked for debridement- consulted Dr Sharol Given.  4/16- haven't been able to tell if Dr Sharol Given came ot see pt- no note- will recheck Monday.    7. Fluids/Electrolytes/Nutrition: Strict INO's with follow-up chemistries  4/9- labs look good except BUN still 46- is dry? But is stable/better than was 1 week ago- was 51.  4/12- will recheck labs in AM   4/13- BUN down to 40- doing better- con't to monitor weekly.  8.  Dysphagia.  Status post PEG tube 09/11/2019 per Dr. Worthy Keeler.  Diet has been advanced to regular.  Patient is still receiving nocturnal tube feeds by PEG tube.  4/9- doing calorie count, to see if can stop TFs  4/13- cannot stop TFs per dietician, since pt has high protein/calorie needs to aid healing, and is Dependent on staff to feed him everything.  9.  Status post tracheostomy 08/06/2019.  Decannulated 11/08/2019. 10.  Hypertension.  Norvasc 10 mg daily, Coreg 25 mg twice daily, Aldactone 25 mg daily.  Monitor with increased mobility  4/9-4/10 BP well controlled- continue regimen. Addendum: Hypotensive to 106/49 at night; decreased amlodipine dose to 101m.   4/13- BP 134/69 this AM- doing better with decreased Norvasc  4/14- Pt's BP 128/70 this AM- doing well cont decreased Norvasc  4/15- BP well cotnrolled- con't meds 11.  History of asthma.  Continue nebulizers as directed  4/15- will con't to wean O2- is at 1L currently  4/16- wrote to wean O2 order and told nursing again 12.  GERD.  Protonix 13.  BPH.  Flomax 0.4 mg daily. 14. Morbid obesity- current BMI of 40- will discuss diet, etc once more appropriate.  4/15- BMI down to 38.8- improving  15. Insomnia- con't meds as above  4/9-4/10 sleeps fine- is his normal amount of sleep  4/12- doesn't sleep well, but is chronic- on Klonopin AND trazodone 100 mg QHS 16. Right ear wax: Debrox ear drops    LOS: 8 days A FACE TO FACE EVALUATION WAS  PERFORMED  [name] 11/22/2019, 4:14 PM

## 2019-11-22 NOTE — Consult Note (Signed)
Neuropsychological Consultation   Patient:   [name]   DOB:   1976/02/14  MR Number:  [id number]  Location:  [facility] [id number] [place] [address] Dept: [place]: [phone]           Date of Service:   11/22/2019  Start Time:   8 AM End Time:   8:45 AM  Provider/Observer:  Ilean Skill, Psy.D.       Clinical Neuropsychologist       Billing Code/Service: 96158/[id number]  Chief Complaint:    [name] is a 44 year old right-handed male with a history of asthma, hypertension and pancreatitis.  Patient presented on 07/22/2019 with Covid pneumonia causing acute respiratory failure.  Patient had significant complications and eventually required intubation and later underwent bilateral chest tube placements.  Long-term intubation required trach completed 08/06/2019 that was downsized on 11/08/2019 and eventually decannulated.  PEG tube placement 09/11/2019.  Patient required long-term broad spectrum antibiotics.  Hospital course complicated by diffuse muscle weakness which was suspected to be critical illness myopathy.  Palliative care continues to follow to establish goals of care.  The patient with deep tissue pressure injuries to bilateral heels and lateral feet.  Patient has now been admitted to the comprehensive rehabilitation program.  Reason for Service:  Patient was referred for neuropsychological consultation due to coping and adjustment issues with long hospital stay and significant debility and weakness and continued expected long hospital course.  Below is the HPI for the current mission.  HPI: [name] is a 44 year old right-handed male with history of asthma, hypertension and pancreatitis.  Per chart review patient lives with spouse independent prior to admission.  1 level home with 4 steps to entry.  Initially presented 07/22/2019 with Covid pneumonia causing  acute respiratory failure.  He developed pneumomediastinum 12/23 with concern for possible bilateral small pneumothoraces 08/01/2019.  Patient initially placed on 10-day course of Decadron as well as REMDESIVIR and TOCILUZIMAB.  Patient required intubation and later underwent bilateral chest tube placements.  TEE 08/03/2019 ejection fraction 70% without emboli.  Long-term intubation required tracheostomy completed 08/06/2019 that has been downsized and decannulated 11/08/2019.  As well as PEG tube placement 09/11/2019 per Dr. Worthy Keeler.  He did require initial long-term broad-spectrum antibiotics.  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  Palliative care continues to follow to establish goals of care.  His diet has been advanced to regular thin liquids.  He was still receiving some nocturnal tube feeds for nutritional support.  Subcutaneous Lovenox for DVT prophylaxis.  Patient with deep tissue pressure injuries to bilateral heels and lateral feet followed by WOC with dressing changes as directed.  Therapy evaluations completed and patient was admitted for a comprehensive rehab program.  Current Status:  Patient was oriented with more active engagement, reporting pain has been improved and he is getting more strength in arms but it is slow progress.  Is aware plan is for SNF after discharge here and he had some concerns about where that would be and what would be done there.  Patient reports that his mood is generally good and denies significant depressive symptoms and is "taking day by day."  Behavioral Observation: [name]  presents as a 44 y.o.-year-old Right Caucasian Male who appeared his stated age. his dress was Appropriate and he was Well Groomed and his manners were Appropriate to  the situation.  his participation was indicative of Appropriate and Attentive behaviors.  There were any physical disabilities noted.   he displayed an appropriate level of cooperation and motivation.     Interactions:    Active Appropriate  Attention:   within normal limits  Memory:   abnormal; remote memory intact, recent memory impaired  Visuo-spatial:  not examined  Speech (Volume):  normal  Speech:   Slowed response style;   Thought Process:  Coherent and Relevant  Though Content:  WNL; not suicidal and not homicidal  Orientation:   person, place, time/date and situation  Judgment:   Good  Planning:   Fair  Affect:    Blunted  Mood:    Dysphoric  Insight:   Fair  Intelligence:   normal  Medical History:   Past Medical History:  Diagnosis Date  . Asthma   . GERD (gastroesophageal reflux disease)   . Headache   . History of kidney stones    LEFT URETERAL STONE  . HTN (hypertension)   . Pancreatitis 2018   GALLBALDDER SLUDGE CAUSED ISSUED RESOLVED     Psychiatric History:  No prior psychiatric history  Family Med/Psych History:  Family History  Problem Relation Age of Onset  . Asthma Brother   . Diabetes Father   . Hypertension Father   . Diabetes Paternal Grandmother   . Hypertension Paternal Grandmother   . Migraines Mother   . GER disease Mother   . Pancreatic cancer Maternal Grandmother   . Heart disease Maternal Grandfather     Impression/DX:  [name] is a 44 year old right-handed male with a history of asthma, hypertension and pancreatitis.  Patient presented on 07/22/2019 with Covid pneumonia causing acute respiratory failure.  Patient had significant complications and eventually required intubation and later underwent bilateral chest tube placements.  Long-term intubation required trach completed 08/06/2019 that was downsized on 11/08/2019 and eventually decannulated.  PEG tube placement 09/11/2019.  Patient required long-term broad spectrum antibiotics.  Hospital course complicated by diffuse muscle weakness which was suspected to be critical illness myopathy.  Palliative  care continues to follow to establish goals of care.  The patient with deep tissue pressure injuries to bilateral heels and lateral feet.  Patient has now been admitted to the comprehensive rehabilitation program.  Patient was oriented with more active engagement, reporting pain has been improved and he is getting more strength in arms but it is slow progress.  Is aware plan is for SNF after discharge here and he had some concerns about where that would be and what would be done there.  Patient reports that his mood is generally good and denies significant depressive symptoms and is "taking day by day."  Disposition/Plan:  Worked on coping and adjustment issues today and will follow up with the patient first of next week.  The patient is dealing with significant medical issues and debility/fatigue.  Diagnosis:    Critical illness myopathy - Plan: Ambulatory referral to Neurology         Electronically Signed   _______________________ Ilean Skill, Psy.D.

## 2019-11-22 NOTE — Progress Notes (Signed)
Occupational Therapy Session Note  Patient Details  Name: [name] DUCRE MRN: [id number] Date of Birth: [birth year]  Today's Date: 11/22/2019 OT Individual Time: 1749-4496 OT Individual Time Calculation (min): 70 min   Short Term Goals: Week 1:  OT Short Term Goal 1 (Week 1): Pt will transfer to w/c in prep for [facility] transfer wiht MAX A +2 in prep for toileting OT Short Term Goal 2 (Week 1): Pt will roll in B directions with MAX A of 1 caregiver to decrease BOC during LB dressing OT Short Term Goal 3 (Week 1): seated upright, pt will bring L hand to mouth in prep for self feeding/grooming OT Short Term Goal 4 (Week 1): Pt will thread 1UE into shirt with S     Skilled Therapeutic Interventions/Progress Updates:    Pt greeted in bed, requesting to get washed up. LB self care completed bedlevel with pt rolling Rt>Lt with Max A for pericare and elevating shorts over hips. Pt able to reach across midline and bend knees to assist with transitional movement. He washed his upper legs with Min A to lift legs to wash under thighs. Pt able to tolerate modified figure 4 position while OT washed lower legs. Supine<sit completed with 2 assist. +2 for slideboard<TIS using small step. Pt completed UB self care while sitting in neutral and in reclined positions in front of the sink. Pt required min facilitation to wash his face using the Lt hand. Assist needed to thoroughly wash neck, chest, and both arms. Total A for donning overhead shirt due to time constraints. Pt agreeable to remain sitting up in the w/c for lunch. Pt reclined for comfort, satting at 96-97% on 1.5L. Elbow props positioned and prevalon boots placed to protect wounds on feet, call bell in his Lt hand.   Therapy Documentation Precautions:  Precautions Precautions: Fall Precaution Comments: bilat wounds/feet, PEG Required Braces or Orthoses: Other Brace Other Brace: B prevalon boots; R wrist cock-up splint Restrictions Weight Bearing  Restrictions: No RLE Weight Bearing: Non weight bearing LLE Weight Bearing: Non weight bearing Other Position/Activity Restrictions: to be clarified due to bilat wounds plantar and lateral aspect of feet Vital Signs: Therapy Vitals Pulse Rate: 77 BP: (!) 100/51 Patient Position (if appropriate): Sitting Pain: RN in during session to administer pain medicine  Pain Assessment Pain Scale: 0-10 Pain Score: 9  Pain Type: Acute pain Pain Location: Knee(leg) Pain Orientation: Right;Left Pain Descriptors / Indicators: Aching Pain Frequency: Constant Patients Stated Pain Goal: 0 Pain Intervention(s): Medication (See eMAR) ADL: ADL Eating: Dependent Grooming: Dependent Upper Body Bathing: Maximal assistance(MAX HOH A) Where Assessed-Upper Body Bathing: Edge of bed Lower Body Bathing: Dependent Where Assessed-Lower Body Bathing: Bed level Upper Body Dressing: Maximal assistance Where Assessed-Upper Body Dressing: Edge of bed Lower Body Dressing: Dependent(+2 to roll) Where Assessed-Lower Body Dressing: Bed level Toileting: Not assessed Toilet Transfer: Not assessed      Therapy/Group: Individual Therapy  Bonham Zingale A Lexianna Weinrich 11/22/2019, 12:37 PM

## 2019-11-22 NOTE — Progress Notes (Signed)
Physical Therapy Session Note  Patient Details  Name: [name] MRN: [id number] Date of Birth: 02/24/1976  Today's Date: 11/22/2019 PT Individual Time: 0920-0930 PT Individual Time Calculation (min): 10 min   Short Term Goals: Week 1:  PT Short Term Goal 1 (Week 1): Pt will tolerate OOB x 2+ hrs PT Short Term Goal 2 (Week 1): Pt will verbalize steps/instruct therapist w/ set up for SBT PT Short Term Goal 3 (Week 1): Bed to/from wc w/max assist of 2 PT Short Term Goal 4 (Week 1): Pt will roll w/max assist of 1 and cues using bed features  Skilled Therapeutic Interventions/Progress Updates:   Pt received in supine, receiving RN care. No c/o pain during session. Pt reporting he needed to toilet. R/L rolling to place bedpan w/ total assist, pt performing 20% himself. Verbal and tactile cues for technique. Total assist to utilize urinal. Pt continent of void but requesting to continue attempting to void and have BM. Pt states he will call when he is finished. Ended session on bed pan - nursing made aware. Missed 20 min of skilled PT 2/2 toileting.   Therapy Documentation Precautions:  Precautions Precautions: Fall Precaution Comments: bilat wounds/feet, PEG Required Braces or Orthoses: Other Brace Other Brace: B prevalon boots; R wrist cock-up splint Restrictions Weight Bearing Restrictions: No RLE Weight Bearing: Non weight bearing LLE Weight Bearing: Non weight bearing Other Position/Activity Restrictions: to be clarified due to bilat wounds plantar and lateral aspect of feet Vital Signs: Therapy Vitals Pulse Rate: 77 Resp: 18 BP: (!) 100/51 Patient Position (if appropriate): Sitting Oxygen Therapy SpO2: 96 % O2 Device: Nasal Cannula O2 Flow Rate (L/min): 1 L/min  Therapy/Group: Individual Therapy  Isabel Ardila Clent Demark 11/22/2019, 9:33 AM

## 2019-11-22 NOTE — Progress Notes (Signed)
Speech Language Pathology Weekly Progress and Session Note  Patient Details  Name: [name] MRN: [id number] Date of Birth: 1975-11-30  Beginning of progress report period: November 15, 2019 End of progress report period: November 22, 2019  Today's Date: 11/22/2019 SLP Individual Time: 0730-0800 SLP Individual Time Calculation (min): 30 min  Short Term Goals: Week 1: SLP Short Term Goal 1 (Week 1): Patient will consume current diet with minimal overt s/s of aspiration with Mod I for use of swallowing compensatory strategies. SLP Short Term Goal 1 - Progress (Week 1): Met SLP Short Term Goal 2 (Week 1): Patient will demonstrate complex problem solving for functional tasks with supervision verbal cues. SLP Short Term Goal 2 - Progress (Week 1): Met SLP Short Term Goal 3 (Week 1): Patient will recall new, daily information with supervision verbal cues. SLP Short Term Goal 3 - Progress (Week 1): Met    New Short Term Goals: Week 2: SLP Short Term Goal 1 (Week 2): Patient will demonstrate complex problem solving for functional tasks with Mod I. SLP Short Term Goal 2 (Week 2): Patient will recall new, daily information with Mod I.  Weekly Progress Updates: Patient has made excellent gains and has met 3 of 3 STGs this reporting period. Currently, patient is consuming regular textures with thin liquids with minimal overt s/s of aspiration and is overall Mod I for use of swallowing compensatory strategies. Patient also requires overall supervision level verbal cues for recall of new information and verbal problem solving with mildly complex tasks. Patient and family education ongoing. Patient would benefit from continued skilled SLP intervention to maximize his cognitive functioning in order to direct his care.      Intensity: Minumum of 1-2 x/day, 30 to 90 minutes Frequency: 3 to 5 out of 7 days Duration/Length of Stay: 3 weeks but may be shorter for SLP Treatment/Interventions: Cognitive  remediation/compensation;Dysphagia/aspiration precaution training;Internal/external aids;Therapeutic Activities;Environmental controls;Cueing hierarchy;Functional tasks;Patient/family education   Daily Session  Skilled Therapeutic Interventions:  Skilled treatment session focused on dysphagia and cognitive goals. SLP facilitated session by providing extra time and supervision verbal cues for patient to direct his care in regards to self-feeding and how best to assist him. Patient consumed regular textures with thin liquids with mininmal overt s/s of aspiration and was Mod I for use of swallowing compensatory strategies, therefore, recommend patient continue current diet. Patient left upright in bed with alarm on and all needs within reach. Continue with current plan of care.      Pain Pain Assessment Pain Scale: 0-10 Pain Score: 0-No pain  Therapy/Group: Individual Therapy  Havilah Topor 11/22/2019, 6:29 AM

## 2019-11-22 NOTE — Progress Notes (Signed)
Physical Therapy Weekly Progress Note  Patient Details  Name: [name] MRN: [id number] Date of Birth: 08/29/1975  Beginning of progress report period: November 15, 2018 End of progress report period: November 22, 2018  Today's Date: 11/22/2019 PT Individual Time: 1400-1445 PT Individual Time Calculation (min): 45 min   Patient has met 3 of 4 short term goals.  He is demonstrating improved tolerance for time spent OOB, gradual increases in  UE and LE strength w/improved use w/bed mobility and transfers.  He does require encouragment to participate and for motivation w/time spent OOB but is participating w/strengthening and endurance activities.  Significant pressure areas on feet limit ability to use functionally.  His dc plan is now for SNF w/decreased burden of care.    Patient continues to demonstrate the following deficits muscle weakness and muscle joint tightness, decreased cardiorespiratoy endurance and decreased oxygen support, decreased coordination and decreased sitting balance, decreased standing balance and decreased postural control and therefore will continue to benefit from skilled PT intervention to increase functional independence with mobility.  Patient progressing toward long term goals..  Continue plan of care.  PT Short Term Goals Week 1:  PT Short Term Goal 1 (Week 1): Pt will tolerate OOB x 2+ hrs PT Short Term Goal 1 - Progress (Week 1): Met PT Short Term Goal 2 (Week 1): Pt will verbalize steps/instruct therapist w/ set up for SBT PT Short Term Goal 2 - Progress (Week 1): Progressing toward goal PT Short Term Goal 3 (Week 1): Bed to/from wc w/max assist of 2 PT Short Term Goal 3 - Progress (Week 1): Met PT Short Term Goal 4 (Week 1): Pt will roll w/max assist of 1 and cues using bed features PT Short Term Goal 4 - Progress (Week 1): Met Week 2:  PT Short Term Goal 1 (Week 2): Pt will transition supine to sit using bed features w/min assist of 1 PT Short Term Goal 2  (Week 2): Pt will perform sit to lean on elbow to sit for placement of SB w/cga only PT Short Term Goal 3 (Week 2): bed to/from wc w/mod assist of 2 PT Short Term Goal 4 (Week 2): Pt wil tolerate 5 min continuous cardiovascular activity  Skilled Therapeutic Interventions/Progress Updates:  Ambulation/gait training;Discharge planning;Functional mobility training;Psychosocial support;Therapeutic Activities;Balance/vestibular training;Disease management/prevention;Neuromuscular re-education;Skin care/wound management;Therapeutic Exercise;Wheelchair propulsion/positioning;DME/adaptive equipment instruction;Pain management;Splinting/orthotics;UE/LE Strength taining/ROM;Patient/family education;Stair training;UE/LE Coordination activities   Therapy Documentation Precautions:  Precautions Precautions: Fall Precaution Comments: bilat wounds/feet, PEG Required Braces or Orthoses: Other Brace Other Brace: B prevalon boots; R wrist cock-up splint Restrictions Weight Bearing Restrictions: No RLE Weight Bearing: Non weight bearing LLE Weight Bearing: Non weight bearing Other Position/Activity Restrictions: to be clarified due to bilat wounds plantar and lateral aspect of feet PAIN Pt c/o sacral/buttock pain in sitting, repositioned for imrpved comfort.  Pt initially supine in bed.  Supine to sit on edge of bed w/HOB elevated approx 30degrees w/mod assist of 1, min assist of 1 and verbal cues for sequencing, additional time.  Static sit w/cga.  Sit to L side on elbow w/mod assist for board placement.  Side to sit wmin assist,  Cues, additional time.  Feet supported on stool.  SBT to wc to L side w/max assist of 1, mod assist of 1, verbal cues for wt shifting/sequencing/hand placement, additional time to allow for max pt participation w/task, rest after each boost.   wc tilted for safety and pt transported to gym for continued session.  Seated at table performed rolling bolster forward/back on table  via arms and ant wt shift of trunk  3x16 w/rest between, cardiovascular challenge for pt.  Progressed to rolling booster up/down blue wedge for increasing power vs endurance.   Repeated 3x4, rests between efforts.  Challenging strengthening activity for pt.  Pt transported back to room at end of session and encouraged pt to remain oob in wc for at least 71mn.  Pt left oob in wc w/alarm belt set and needs in reach  Therapy/Group: Individual Therapy  BCallie Fielding PBull Shoals4/16/2021, 4:33 PM

## 2019-11-22 NOTE — Plan of Care (Signed)
  Problem: Education: Goal: Required Educational Video(s) Outcome: Progressing   Problem: Consults Goal: RH GENERAL PATIENT EDUCATION Description: See Patient Education module for education specifics. Outcome: Progressing Goal: Skin Care Protocol Initiated - if Braden Score 18 or less Description: If consults are not indicated, leave blank or document N/A Outcome: Progressing   Problem: RH BOWEL ELIMINATION Goal: RH STG MANAGE BOWEL WITH ASSISTANCE Description: STG Manage Bowel with min Assistance. Outcome: Progressing   Problem: RH BLADDER ELIMINATION Goal: RH STG MANAGE BLADDER WITH ASSISTANCE Description: STG Manage Bladder With min Assistance Outcome: Progressing   Problem: RH SKIN INTEGRITY Goal: RH STG SKIN FREE OF INFECTION/BREAKDOWN Description: Skin will be free of infection/breakdown with mod assistance Outcome: Progressing Goal: RH STG MAINTAIN SKIN INTEGRITY WITH ASSISTANCE Description: STG Maintain Skin Integrity With mod Assistance. Outcome: Progressing Goal: RH STG ABLE TO PERFORM INCISION/WOUND CARE W/ASSISTANCE Description: STG Able To Perform Incision/Wound Care With total Assistance. Outcome: Progressing   Problem: RH SAFETY Goal: RH STG ADHERE TO SAFETY PRECAUTIONS W/ASSISTANCE/DEVICE Description: STG Adhere to Safety Precautions With min Assistance/Device. Outcome: Progressing Goal: RH STG DECREASED RISK OF FALL WITH ASSISTANCE Description: STG Decreased Risk of Fall With max Assistance. Outcome: Progressing   Problem: RH PAIN MANAGEMENT Goal: RH STG PAIN MANAGED AT OR BELOW PT'S PAIN GOAL Description: Pain will be less than 3 out of 10 on pain scale. Pt will be able to verbalize when in pain  Outcome: Progressing

## 2019-11-23 ENCOUNTER — Inpatient Hospital Stay (HOSPITAL_COMMUNITY): Payer: Managed Care, Other (non HMO)

## 2019-11-23 DIAGNOSIS — G7281 Critical illness myopathy: Secondary | ICD-10-CM | POA: Diagnosis not present

## 2019-11-23 LAB — GLUCOSE, CAPILLARY
Glucose-Capillary: 96 mg/dL (ref 70–99)
Glucose-Capillary: 126 mg/dL — ABNORMAL HIGH (ref 70–99)
Glucose-Capillary: 117 mg/dL — ABNORMAL HIGH (ref 70–99)
Glucose-Capillary: 92 mg/dL (ref 70–99)

## 2019-11-23 MED ORDER — AMLODIPINE BESYLATE 2.5 MG PO TABS
2.5000 mg | ORAL_TABLET | Freq: Every day | ORAL | Status: DC
  Administered 2019-11-24 – 2019-12-04 (×11): 2.5 mg via ORAL
  Filled 2019-11-23 (×11): qty 1

## 2019-11-23 NOTE — Progress Notes (Signed)
[name] PHYSICAL MEDICINE & REHABILITATION PROGRESS NOTE   Subjective/Complaints:   No note in last 24 hours from Dr Sharol Given- will call again Monday to see if can see pt. Pt reports BM this AM- O2 not in place- says that it comes out his nose A lot".     ROS:  Pt denies SOB, abd pain, CP, N/V/C/D, and vision changes    Objective:   No results found. No results for input(s): WBC, HGB, HCT, PLT in the last 72 hours. Recent Labs    11/21/19 0459  CREATININE 0.81    Intake/Output Summary (Last 24 hours) at 11/23/2019 1417 Last data filed at 11/23/2019 1315 Gross per 24 hour  Intake 360 ml  Output 1600 ml  Net -1240 ml     Physical Exam: Vital Signs Blood pressure (!) 99/53, pulse 82, temperature 98.5 F (36.9 C), temperature source Oral, resp. rate 14, weight 116.2 kg, SpO2 99 %.  Constitutional:sitting up in bed; wearing O2 but not in nose; NAD Cardiovascular: RRR Respiratory: CTA B/L- good air movement- O2 by [place] 1L- sats 99% GI: protuberant, soft, NT, ND, (+)BS Musculoskeletal:     Cervical back: Normal range of motion and neck supple.     Comments: RUE- delt 2-/5, biceps 4-/5, tricep 3/5, WE 3/5, grip 3/5, finger 2/5 LUE- delt 2-/5, biceps 2/5, triceps 3/5, WE 1/5, grip 2+/5, finger 1/5 LEs B/L- HF 2-/5, KE and KF 2/5, DF 1/5; PF 2/5 Neurological:Ox3 Pt reports sensation decreased in L thumb and RUE; normal in LEs to light touch No hoffman's or clonus B/L  Skin:  Has B/L anterior calf scrapes of skin- covered with dressings B/L- superficial scrapes/no sign of infection.  Bottom of feet have a thick black eschar 1/3 of it peeled off- ~ 2-74m thick  half of feet B/L-reassessed- no change seen Psychiatric:  Calm; appropriate  Assessment/Plan: 1. 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  Physiatrist is providing close team supervision and 24 hour  management of active medical problems listed below.  Physiatrist and rehab team continue to assess barriers to discharge/monitor patient progress toward functional and medical goals  Care Tool:  Bathing    Body parts bathed by patient: Abdomen   Body parts bathed by helper: Right arm, Left arm, Chest, Front perineal area, Buttocks, Right upper leg, Left upper leg, Right lower leg, Left lower leg, Face     Bathing assist Assist Level: Total Assistance - Patient < 25%     Upper Body Dressing/Undressing Upper body dressing   What is the patient wearing?: Hospital gown only    Upper body assist Assist Level: Total Assistance - Patient < 25%    Lower Body Dressing/Undressing Lower body dressing      What is the patient wearing?: Pants, Incontinence brief     Lower body assist Assist for lower body dressing: Total Assistance - Patient < 25%     Toileting Toileting    Toileting assist Assist for toileting: Total Assistance - Patient < 25%     Transfers Chair/bed transfer  Transfers assist     Chair/bed transfer assist level: 2 Helpers     Locomotion Ambulation   Ambulation assist   Ambulation activity did not occur: Safety/medical concerns          Walk 10 feet activity   Assist  Walk 10 feet activity did not occur: Safety/medical concerns  Walk 50 feet activity   Assist Walk 50 feet with 2 turns activity did not occur: Safety/medical concerns         Walk 150 feet activity   Assist Walk 150 feet activity did not occur: Safety/medical concerns         Walk 10 feet on uneven surface  activity   Assist Walk 10 feet on uneven surfaces activity did not occur: Safety/medical concerns         Wheelchair     Assist Will patient use wheelchair at discharge?: Yes Type of Wheelchair: Manual    Wheelchair assist level: Minimal Assistance - Patient > 75% Max wheelchair distance: 50(w/mult rest breaks)    Wheelchair 50  feet with 2 turns activity    Assist    Wheelchair 50 feet with 2 turns activity did not occur: Safety/medical concerns(unable to turn wc)       Wheelchair 150 feet activity     Assist  Wheelchair 150 feet activity did not occur: Safety/medical concerns       Blood pressure (!) 99/53, pulse 82, temperature 98.5 F (36.9 C), temperature source Oral, resp. rate 14, weight 116.2 kg, SpO2 99 %.  Medical Problem List and Plan: 1.  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              -ELOS/Goals: 3-4 weeks- goals max assist- severely impaired  -Continue CIR PT, OT, SLP 2.  Antithrombotics: -DVT/anticoagulation: Lovenox 60 mg daily.  Vascular study negative             -antiplatelet therapy: N/A 3. Pain Management: Dilaudid 2 mg every 6 hours as needed pain 4. Mood: Melatonin 6 mg nightly, trazodone 100 mg nightly, Klonopin 1 mg nightly, Lexapro 20 mg daily, Valium 2.5 mg every 12 hours as needed anxiety  4/9- will try to get Neuropsych for pt due to anxiety.   4/10: Did not use valium today  4/12- neuropsych on schedule per SW  4/16- saw Neuropsych today- discussed SNF planning.              -antipsychotic agents: N/A 5. Neuropsych: This patient is capable of making decisions on his own behalf. 6. Skin/Wound/ Care/deep tissue pressure injury bilateral heels and lateral feet: Wound care as directed  4/11: Ordered betadine dressings for bilateral feet as he was receiving in acute care.   4/14- consulted [facility]: they asked for debridement- consulted Dr Sharol Given.  4/16-  haven't been able to tell if Dr Sharol Given came ot see pt- no note- will recheck Monday.  4/17- will call Dr Sharol Given again Monday to see if can see/evaluate pt.     7. Fluids/Electrolytes/Nutrition: Strict INO's with follow-up chemistries  4/9- labs look good except BUN still 46- is dry? But is stable/better than was 1 week ago- was 51.  4/12- will recheck labs in AM   4/13- BUN down to 40- doing better- con't to monitor weekly.  4/17- labs monday  8.  Dysphagia.  Status post PEG tube 09/11/2019 per Dr. Worthy Keeler.  Diet has been advanced to regular.  Patient is still receiving nocturnal tube feeds by PEG tube.  4/9- doing calorie count, to see if can stop TFs  4/13- cannot stop TFs per dietician, since pt has high protein/calorie needs to aid healing, and is Dependent on staff to feed him everything.  9.  Status post tracheostomy 08/06/2019.  Decannulated 11/08/2019. 10.  Hypertension.  Norvasc 10 mg daily, Coreg 25 mg twice daily, Aldactone 25 mg daily.  Monitor with increased mobility  4/9-4/10 BP well controlled- continue regimen. Addendum: Hypotensive to 106/49 at night; decreased amlodipine dose to 86m.   4/13- BP 134/69 this AM- doing better with decreased Norvasc  4/14- Pt's BP 128/70 this AM- doing well cont decreased Norvasc  4/15- BP well cotnrolled- con't meds  4/17- BP 99/52 this AM- will decrease norvasc to 2.5 mg daily.  11.  History of asthma.  Continue nebulizers as directed  4/15- will con't to wean O2- is at 1L currently  4/16- wrote to wean O2 order and told nursing again  4/17- sats 99%- wearing 1L O2- will d/w nursing again- I think it can come off, unless nursing can show he needs it- his sats are 95-99% on 1L. Wrote order to wean, again.  12.  GERD.  Protonix 13.  BPH.  Flomax 0.4 mg daily. 14. Morbid obesity- current BMI of 40- will discuss diet, etc once more appropriate.  4/15- BMI down to 38.8- improving  15. Insomnia- con't meds as above  4/9-4/10 sleeps fine- is his  normal amount of sleep  4/12- doesn't sleep well, but is chronic- on Klonopin AND trazodone 100 mg QHS 16. Right ear wax: Debrox ear drops    LOS: 9 days A FACE TO FACE EVALUATION WAS PERFORMED  [name] 11/23/2019, 2:17 PM

## 2019-11-23 NOTE — Progress Notes (Signed)
Occupational Therapy Session Note  Patient Details  Name: [name] MRN: [id number] Date of Birth: [birth year]  Today's Date: 11/23/2019 OT Individual Time: 1000-1115 OT Individual Time Calculation (min): 75 min    Short Term Goals: Week 2:     Skilled Therapeutic Interventions/Progress Updates:    OT session focused on bed mobility, functional endurance, and sitting balance required for ADLs. Pt received supine in bed requesting to use bedpan. Completed rolling L<>R for positioning of bedpan and hygiene. Pt required max A to complete rolling with use of bed rails. OT coached pt through breathing techniques throughout bed mobility. Completed supine>sitting EOB with max A +2. Pt remained sitting ~15 min to increase functional endurance. Provided hand over hand assist to wash face and drink from cup with OT supporting at elbow and wrist. Pt donned gown with total assist. Transitioned back to supine with max A +2 and total assist to position in bed. OT provided sponge block form grip strengthening exercises to complete in bed. Pt left with all needs in reach.   Therapy Documentation Precautions:  Precautions Precautions: Fall Precaution Comments: bilat wounds/feet, PEG Required Braces or Orthoses: Other Brace Other Brace: B prevalon boots; R wrist cock-up splint Restrictions Weight Bearing Restrictions: No RLE Weight Bearing: Non weight bearing LLE Weight Bearing: Non weight bearing Other Position/Activity Restrictions: to be clarified due to bilat wounds plantar and lateral aspect of feet General:   Vital Signs: Therapy Vitals Pulse Rate: 82 BP: (!) 99/53 Patient Position (if appropriate): Lying Oxygen Therapy SpO2: 99 % Pain:   ADL: ADL Eating: Dependent Grooming: Dependent Upper Body Bathing: Maximal assistance(MAX HOH A) Where Assessed-Upper Body Bathing: Edge of bed Lower Body Bathing: Dependent Where Assessed-Lower Body Bathing: Bed level Upper Body Dressing:  Maximal assistance Where Assessed-Upper Body Dressing: Edge of bed Lower Body Dressing: Dependent(+2 to roll) Where Assessed-Lower Body Dressing: Bed level Toileting: Not assessed Toilet Transfer: Not assessed Vision   Perception    Praxis   Exercises:   Other Treatments:     Therapy/Group: Individual Therapy  Duayne Cal 11/23/2019, 11:30 AM

## 2019-11-23 NOTE — Progress Notes (Signed)
Physical Therapy Session Note  Patient Details  Name: [name] MRN: [id number] Date of Birth: [birth year]  Today's Date: 11/23/2019 PT Individual Time: 1430-1530 PT Individual Time Calculation (min): 60 min   Short Term Goals: Week 2:  PT Short Term Goal 1 (Week 2): Pt will transition supine to sit using bed features w/min assist of 1 PT Short Term Goal 2 (Week 2): Pt will perform sit to lean on elbow to sit for placement of SB w/cga only PT Short Term Goal 3 (Week 2): bed to/from wc w/mod assist of 2 PT Short Term Goal 4 (Week 2): Pt wil tolerate 5 min continuous cardiovascular activity  Skilled Therapeutic Interventions/Progress Updates:   Pt presents in supine and agreeable to session. Rolling for donning of pants with max assist and second person to assist with clothing management several reps to R and L. Focused on weightbearing through use of Kreg bed tilt table feature for activation of musculature in BLE and trunk for carryover to functional mobility. Pt currently with no WB restrictions and monitored to tolerance throughout. Total assist to don socks and shoes.   Supine BP = 109/44 mmHg; HR - 85 bpm Tilt bed at 19 degrees; BP = 120/82 mmHg; HR - 86 bpm 127/63 mmHg; HR = 90 bpm @ 35 degrees x 20 min total   In weightbearing position, pt able to perform 2 sets of 10 reps of glut sets and quad sets at each level. Engaged mother in goals of session as well.   Therapy Documentation Precautions:  Precautions Precautions: Fall Precaution Comments: bilat wounds/feet, PEG Required Braces or Orthoses: Other Brace Other Brace: B prevalon boots; R wrist cock-up splint Restrictions Weight Bearing Restrictions: No Other Position/Activity Restrictions: to be clarified due to bilat wounds plantar and lateral aspect of feet Pain:  reports ongoing knee pain. No intervention needed. Just monitored during therapy.   Therapy/Group: Individual Therapy  Canary Brim Ivory Broad, PT, DPT, CBIS  11/23/2019, 3:36 PM

## 2019-11-24 ENCOUNTER — Inpatient Hospital Stay (HOSPITAL_COMMUNITY): Payer: Managed Care, Other (non HMO)

## 2019-11-24 DIAGNOSIS — G7281 Critical illness myopathy: Secondary | ICD-10-CM | POA: Diagnosis not present

## 2019-11-24 LAB — GLUCOSE, CAPILLARY
Glucose-Capillary: 137 mg/dL — ABNORMAL HIGH (ref 70–99)
Glucose-Capillary: 94 mg/dL (ref 70–99)
Glucose-Capillary: 106 mg/dL — ABNORMAL HIGH (ref 70–99)
Glucose-Capillary: 130 mg/dL — ABNORMAL HIGH (ref 70–99)

## 2019-11-24 MED ORDER — ONDANSETRON HCL 4 MG PO TABS
4.0000 mg | ORAL_TABLET | Freq: Every day | ORAL | Status: DC
  Administered 2019-11-25 – 2019-12-04 (×10): 4 mg via ORAL
  Filled 2019-11-24 (×10): qty 1

## 2019-11-24 NOTE — Plan of Care (Signed)
  Problem: Education: Goal: Required Educational Video(s) Outcome: Progressing   Problem: Consults Goal: RH GENERAL PATIENT EDUCATION Description: See Patient Education module for education specifics. Outcome: Progressing Goal: Skin Care Protocol Initiated - if Braden Score 18 or less Description: If consults are not indicated, leave blank or document N/A Outcome: Progressing   Problem: RH BOWEL ELIMINATION Goal: RH STG MANAGE BOWEL WITH ASSISTANCE Description: STG Manage Bowel with min Assistance. Outcome: Progressing   Problem: RH BLADDER ELIMINATION Goal: RH STG MANAGE BLADDER WITH ASSISTANCE Description: STG Manage Bladder With min Assistance Outcome: Progressing   Problem: RH SKIN INTEGRITY Goal: RH STG SKIN FREE OF INFECTION/BREAKDOWN Description: Skin will be free of infection/breakdown with mod assistance Outcome: Progressing Goal: RH STG MAINTAIN SKIN INTEGRITY WITH ASSISTANCE Description: STG Maintain Skin Integrity With mod Assistance. Outcome: Progressing Goal: RH STG ABLE TO PERFORM INCISION/WOUND CARE W/ASSISTANCE Description: STG Able To Perform Incision/Wound Care With total Assistance. Outcome: Progressing   Problem: RH SAFETY Goal: RH STG ADHERE TO SAFETY PRECAUTIONS W/ASSISTANCE/DEVICE Description: STG Adhere to Safety Precautions With min Assistance/Device. Outcome: Progressing Goal: RH STG DECREASED RISK OF FALL WITH ASSISTANCE Description: STG Decreased Risk of Fall With max Assistance. Outcome: Progressing   Problem: RH PAIN MANAGEMENT Goal: RH STG PAIN MANAGED AT OR BELOW PT'S PAIN GOAL Description: Pain will be less than 3 out of 10 on pain scale. Pt will be able to verbalize when in pain  Outcome: Progressing

## 2019-11-24 NOTE — Progress Notes (Signed)
[facility] PHYSICAL MEDICINE & REHABILITATION PROGRESS NOTE   Subjective/Complaints:   Pt c/o a little nausea this AM- not hungry last few mornings- thinks due ot TFs.   Asking for some anti-nausea in AM- scheduled.   Doesn't feel needs O2 anymore.   ROS: Pt denies SOB, abd pain, CP, N/V/C/D, and vision changes   Objective:   No results found. No results for input(s): WBC, HGB, HCT, PLT in the last 72 hours. No results for input(s): NA, K, CL, CO2, GLUCOSE, BUN, CREATININE, CALCIUM in the last 72 hours.  Intake/Output Summary (Last 24 hours) at 11/24/2019 1536 Last data filed at 11/24/2019 0920 Gross per 24 hour  Intake 400 ml  Output 1150 ml  Net -750 ml     Physical Exam: Vital Signs Blood pressure 104/64, pulse 100, temperature 98.3 F (36.8 C), resp. rate 18, weight 116.2 kg, SpO2 (!) 87 %.  Constitutional: sitting up in bed; being treated by OT_ just started, bed louder than nml, NAD O2 in nose; making marks on face from line Cardiovascular: RRR Respiratory: CTA B/L- good air movement had OT take off O2 for now and see how tolerated GI: Soft, NT, ND, (+)BS  Musculoskeletal:     Cervical back: Normal range of motion and neck supple.     Comments: RUE- delt- 2/5, bicep 2/5, tricep 3-/5, WE 0/5, grip 3-/5, finger 0/5  LUE- deltoid 2/5, biceps 2 to 2-/5, triceps 4-/5, E 3/5, grip 3/5, finger 1/5 LEs B/L- HF 2-/5, KE and KF 2/5, DF 1/5; PF 2/5 Neurological: Ox3-  Pt reports sensation decreased in L thumb and RUE; normal in LEs to light touch No hoffman's or clonus B/L  Skin:  Has B/L anterior calf scrapes of skin- covered with dressings B/L- superficial scrapes/no sign of infection.  Bottom of feet have a thick black eschar 1/3 of it peeled off- ~ 2-53m thick  half of feet B/L-reassessed today- no change Psychiatric:  appropriate  Assessment/Plan: 1. Functional deficits secondary to ICU myopathy which require 3+ hours per day of interdisciplinary therapy in a  comprehensive inpatient rehab setting.  Physiatrist is providing close team supervision and 24 hour management of active medical problems listed below.  Physiatrist and rehab team continue to assess barriers to discharge/monitor patient progress toward functional and medical goals  Care Tool:  Bathing    Body parts bathed by patient: Abdomen   Body parts bathed by helper: Right arm, Left arm, Chest, Front perineal area, Buttocks, Right upper leg, Left upper leg, Right lower leg, Left lower leg, Face     Bathing assist Assist Level: Total Assistance - Patient < 25%     Upper Body Dressing/Undressing Upper body dressing   What is the patient wearing?: Hospital gown only    Upper body assist Assist Level: Total Assistance - Patient < 25%    Lower Body Dressing/Undressing Lower body dressing      What is the patient wearing?: Pants, Incontinence brief     Lower body assist Assist for lower body dressing: Total Assistance - Patient < 25%     Toileting Toileting    Toileting assist Assist for toileting: Total Assistance - Patient < 25%     Transfers Chair/bed transfer  Transfers assist     Chair/bed transfer assist level: 2 Helpers     Locomotion Ambulation   Ambulation assist   Ambulation activity did not occur: Safety/medical concerns          Walk 10 feet activity  Assist  Walk 10 feet activity did not occur: Safety/medical concerns        Walk 50 feet activity   Assist Walk 50 feet with 2 turns activity did not occur: Safety/medical concerns         Walk 150 feet activity   Assist Walk 150 feet activity did not occur: Safety/medical concerns         Walk 10 feet on uneven surface  activity   Assist Walk 10 feet on uneven surfaces activity did not occur: Safety/medical concerns         Wheelchair     Assist Will patient use wheelchair at discharge?: Yes Type of Wheelchair: Manual    Wheelchair assist level:  Minimal Assistance - Patient > 75% Max wheelchair distance: 50(w/mult rest breaks)    Wheelchair 50 feet with 2 turns activity    Assist    Wheelchair 50 feet with 2 turns activity did not occur: Safety/medical concerns(unable to turn wc)       Wheelchair 150 feet activity     Assist  Wheelchair 150 feet activity did not occur: Safety/medical concerns       Blood pressure 104/64, pulse 100, temperature 98.3 F (36.8 C), resp. rate 18, weight 116.2 kg, SpO2 (!) 87 %.  Medical Problem List and Plan: 1.  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              -ELOS/Goals: 3-4 weeks- goals max assist- severely impaired  -Continue CIR PT, OT, SLP 2.  Antithrombotics: -DVT/anticoagulation: Lovenox 60 mg daily.  Vascular study negative             -antiplatelet therapy: N/A 3. Pain Management: Dilaudid 2 mg every 6 hours as needed pain 4. Mood: Melatonin 6 mg nightly, trazodone 100 mg nightly, Klonopin 1 mg nightly, Lexapro 20 mg daily, Valium 2.5 mg every 12 hours as needed anxiety  4/9- will try to get Neuropsych for pt due to anxiety.   4/10: Did not use valium today  4/12- neuropsych on schedule per SW  4/16- saw Neuropsych today- discussed SNF planning.              -antipsychotic agents: N/A 5. Neuropsych: This patient is capable of making decisions on his own behalf. 6. Skin/Wound/ Care/deep tissue pressure injury bilateral heels and lateral feet: Wound care as directed  4/11: Ordered betadine dressings for bilateral feet as he was receiving in acute care.    4/14- consulted [facility]: they asked for debridement- consulted Dr Sharol Given.  4/16- haven't been able to tell if Dr Sharol Given came ot see pt- no note- will recheck Monday.  4/18- will call Dr Sharol Given Monday to see if can assess- didn't ever see note.      7. Fluids/Electrolytes/Nutrition: Strict INO's with follow-up chemistries  4/9- labs look good except BUN still 46- is dry? But is stable/better than was 1 week ago- was 51.  4/12- will recheck labs in AM   4/13- BUN  down to 40- doing better- con't to monitor weekly.  4/17- labs monday  8.  Dysphagia.  Status post PEG tube 09/11/2019 per Dr. Worthy Keeler.  Diet has been advanced to regular.  Patient is still receiving nocturnal tube feeds by PEG tube.  4/9- doing calorie count, to see if can stop TFs  4/13- cannot stop TFs per dietician, since pt has high protein/calorie needs to aid healing, and is Dependent on staff to feed him everything.  9.  Status post tracheostomy 08/06/2019.  Decannulated 11/08/2019. 10.  Hypertension.  Norvasc 10 mg daily, Coreg 25 mg twice daily, Aldactone 25 mg daily.  Monitor with increased mobility  4/9-4/10 BP well controlled- continue regimen. Addendum: Hypotensive to 106/49 at night; decreased amlodipine dose to 65m.   4/13- BP 134/69 this AM- doing better with decreased Norvasc  4/14- Pt's BP 128/70 this AM- doing well cont decreased Norvasc  4/15- BP well cotnrolled- con't meds  4/17- BP 99/52 this AM- will decrease norvasc to 2.5 mg daily.  4/18- BP 104/64- if doesn't improve, suggest stopping Norvasc  11.  History of asthma.  Continue nebulizers as directed  4/15- will con't to wean O2- is at 1L currently  4/16- wrote to wean O2 order and told nursing again  4/17- sats 99%- wearing 1L O2- will d/w nursing again- I think it can come off, unless nursing can show he needs it- his sats are 95-99% on 1L. Wrote order to wean, again.  4/18- O2 dropped to 87% today after tolerating therapy without O2- will use prn.   12.  GERD.   Protonix 13.  BPH.  Flomax 0.4 mg daily. 14. Morbid obesity- current BMI of 40- will discuss diet, etc once more appropriate.  4/15- BMI down to 38.8- improving  15. Insomnia- con't meds as above  4/9-4/10 sleeps fine- is his normal amount of sleep  4/12- doesn't sleep well, but is chronic- on Klonopin AND trazodone 100 mg QHS 16. Right ear wax: Debrox ear drops 17. Nausea  4/18- will schedule Zofran at 0600 am to help with AM nausea.  LOS: 10 days A FACE TO FACE EVALUATION WAS PERFORMED  Lendora Keys 11/24/2019, 3:36 PM

## 2019-11-24 NOTE — Plan of Care (Signed)
Needs reinforcement with assisting with/providing own bladder elimination cares.

## 2019-11-24 NOTE — Progress Notes (Addendum)
Physical Therapy Session Note  Patient Details  Name: [name] MRN: [id number] Date of Birth: [birth year]  Today's Date: 11/24/2019 PT Individual Time: 4970-2637 and 8588-5027 PT Individual Time Calculation (min): 20 min and 38 min   Short Term Goals: Week 2:  PT Short Term Goal 1 (Week 2): Pt will transition supine to sit using bed features w/min assist of 1 PT Short Term Goal 2 (Week 2): Pt will perform sit to lean on elbow to sit for placement of SB w/cga only PT Short Term Goal 3 (Week 2): bed to/from wc w/mod assist of 2 PT Short Term Goal 4 (Week 2): Pt wil tolerate 5 min continuous cardiovascular activity  Skilled Therapeutic Interventions/Progress Updates:     Patient in bed on RA, SPO2 95%, upon PT arrival. Patient alert and agreeable to PT session. Patient reported 4-5/10 LE joint pain during session, RN made aware. PT provided repositioning, rest breaks, and distraction as pain interventions throughout session. Session interrupted at 20 min for PT to assist another patient back to bed with nursing staff. Focused session on LE strengthening/ROM and vertical tolerance in lying and sitting.  Therapeutic Activity: Vertical Tilt in Kreg Bed: Performed for increased tolerance to vertical positioning and LE weight bearing. Donned patient's socks and shoes prior with total A for protection for B heels, no orders for non-weight bearing in chart.  Vitals: 0 degrees: BP 123/49, HR 90, SPO2 93% 20 degrees: BP 103/39, HR 95, SPO2 92% 20 degrees after 3 min: BP 76/48, HR 91, SPO2 96% (performed diaphragmatic breathing x3 min with manual resistance over diaphragm with improved O2 sats) 0 degrees: BP 106/64, HR 93, SPO2 95% Patient asymptomatic throughout, RN made aware of vitals.  Bed Mobility: Patient performed supine to sit with mod-max A +2 for trunk and LE managment. Provided verbal cues for performing log rolling and setting his bottom elbow to push up. Transfers: Patient performed a  slide board transfer bed> TIS w/c with max-total A +2, total A due to fatigue halfway through transfer, and total A for board placement. Provided cues for hand placement, board placement, and head-hips relationship for proper technique and decreased assist with transfers. Patient performed lateral leans x3 to the R for board placement with mod A of 1 to return to sitting.   Therapeutic Exercise: Patient performed the following exercises with verbal and tactile cues for proper technique. -B heel slides with AAROM 3x10 -B quad sets 2x10 -B ankle pumps 2x10 -B DF stretch with knee extension 2x30 sec  Patient in TIS w/c at end of session with breaks locked, chair alarm set, and all needs within reach. Vitals: BP 101/52, HR 95, SPO2 94%. Patient remained on RA throughout session, SPO2 >90%. Educated on [place] and benefits of being OOB. Patient agreed to sit up for lunch and attempt to stay sitting until therapy session this afternoon.    Therapy Documentation Precautions:  Precautions Precautions: Fall Precaution Comments: bilat wounds/feet, PEG Required Braces or Orthoses: Other Brace Other Brace: B prevalon boots; R wrist cock-up splint Restrictions Weight Bearing Restrictions: No RLE Weight Bearing: Non weight bearing LLE Weight Bearing: Non weight bearing Other Position/Activity Restrictions: to be clarified due to bilat wounds plantar and lateral aspect of feet   Therapy/Group: Individual Therapy  Lindee Leason L Sabrena Gavitt PT, DPT  11/24/2019, 1:00 PM

## 2019-11-24 NOTE — Progress Notes (Signed)
Occupational Therapy Session Note  Patient Details  Name: [name] MRN: [id number] Date of Birth: [birth year]  Today's Date: 11/24/2019 OT Individual Time: 7543-6067 OT Individual Time Calculation (min): 75 min    Short Term Goals: Week 2:     Skilled Therapeutic Interventions/Progress Updates:    OT session focused on self-feeding skills, bed mobility, and functional endurance. MD arrived early in session ordering to wean of 1L O2. Pt remained off of O2 throughout session with SpO2 >90% throughout and no SOB noted. Informed NT of monitoring SpO2. Pt ate breakfast while seated upright in bed with HOB elevated to 46 degrees. Encouraged sitting EOB or in w/c, however pt verbalized too fatigued from lack of sleep last night. Donned brace to R hand/wrist for 1 hour of session with good tolerance. No issues of skin integrity following removal. Provided built up handle to utensils following OT cutting food into appropriate sizes. Pt demonstrated 70% accuracy with self-feeding skills to load fork and transfer to mouth (R hand) with increased time and progressing fatigue. OT provided hand over hand assist to grasp cup with L hand and bring to mouth to drink 7x. Following meal, pt requested to toilet requiring total assist x2 to place on bedpan as client rolled L<>R with min-mod A. Pt verbalized wanting to discharge home, rather than SNF. Discussed need for 24/7 care with pt reporting his family is looking into this option. Encouraged discussing in more detail with primary therapist's tomorrow.   Therapy Documentation Precautions:  Precautions Precautions: Fall Precaution Comments: bilat wounds/feet, PEG Required Braces or Orthoses: Other Brace Other Brace: B prevalon boots; R wrist cock-up splint Restrictions Weight Bearing Restrictions: No RLE Weight Bearing: Non weight bearing LLE Weight Bearing: Non weight bearing Other Position/Activity Restrictions: to be clarified due to bilat wounds  plantar and lateral aspect of feet General:   Vital Signs:  Pain:   ADL: ADL Eating: Dependent Grooming: Dependent Upper Body Bathing: Maximal assistance(MAX HOH A) Where Assessed-Upper Body Bathing: Edge of bed Lower Body Bathing: Dependent Where Assessed-Lower Body Bathing: Bed level Upper Body Dressing: Maximal assistance Where Assessed-Upper Body Dressing: Edge of bed Lower Body Dressing: Dependent(+2 to roll) Where Assessed-Lower Body Dressing: Bed level Toileting: Not assessed Toilet Transfer: Not assessed Vision   Perception    Praxis   Exercises:   Other Treatments:     Therapy/Group: Individual Therapy  Duayne Cal 11/24/2019, 10:47 AM

## 2019-11-25 ENCOUNTER — Inpatient Hospital Stay (HOSPITAL_COMMUNITY): Payer: Managed Care, Other (non HMO) | Admitting: Occupational Therapy

## 2019-11-25 ENCOUNTER — Inpatient Hospital Stay (HOSPITAL_COMMUNITY): Payer: Managed Care, Other (non HMO)

## 2019-11-25 DIAGNOSIS — R234 Changes in skin texture: Secondary | ICD-10-CM | POA: Diagnosis not present

## 2019-11-25 DIAGNOSIS — G7281 Critical illness myopathy: Secondary | ICD-10-CM | POA: Diagnosis not present

## 2019-11-25 DIAGNOSIS — E440 Moderate protein-calorie malnutrition: Secondary | ICD-10-CM

## 2019-11-25 LAB — COMPREHENSIVE METABOLIC PANEL
ALT: 13 U/L (ref 0–44)
AST: 14 U/L — ABNORMAL LOW (ref 15–41)
Albumin: 2.7 g/dL — ABNORMAL LOW (ref 3.5–5.0)
Alkaline Phosphatase: 66 U/L (ref 38–126)
Anion gap: 12 (ref 5–15)
BUN: 40 mg/dL — ABNORMAL HIGH (ref 6–20)
CO2: 28 mmol/L (ref 22–32)
Calcium: 9.7 mg/dL (ref 8.9–10.3)
Chloride: 96 mmol/L — ABNORMAL LOW (ref 98–111)
Creatinine, Ser: 0.96 mg/dL (ref 0.61–1.24)
GFR calc Af Amer: >60 mL/min (ref >60)
GFR calc non Af Amer: >60 mL/min (ref >60)
Glucose, Bld: 99 mg/dL (ref 70–99)
Potassium: 3.9 mmol/L (ref 3.5–5.1)
Sodium: 136 mmol/L (ref 135–145)
Total Bilirubin: 0.2 mg/dL — ABNORMAL LOW (ref 0.3–1.2)
Total Protein: 7.4 g/dL (ref 6.5–8.1)

## 2019-11-25 LAB — CBC WITH DIFFERENTIAL/PLATELET
Abs Immature Granulocytes: 0.06 10*3/uL (ref 0.00–0.07)
Basophils Absolute: 0.1 10*3/uL (ref 0.0–0.1)
Basophils Relative: 1 %
Eosinophils Absolute: 0.3 10*3/uL (ref 0.0–0.5)
Eosinophils Relative: 4 %
HCT: 38.9 % — ABNORMAL LOW (ref 39.0–52.0)
Hemoglobin: 12.1 g/dL — ABNORMAL LOW (ref 13.0–17.0)
Immature Granulocytes: 1 %
Lymphocytes Relative: 26 %
Lymphs Abs: 2 10*3/uL (ref 0.7–4.0)
MCH: 27.6 pg (ref 26.0–34.0)
MCHC: 31.1 g/dL (ref 30.0–36.0)
MCV: 88.6 fL (ref 80.0–100.0)
Monocytes Absolute: 0.7 10*3/uL (ref 0.1–1.0)
Monocytes Relative: 10 %
Neutro Abs: 4.6 10*3/uL (ref 1.7–7.7)
Neutrophils Relative %: 58 %
Platelets: 182 10*3/uL (ref 150–400)
RBC: 4.39 MIL/uL (ref 4.22–5.81)
RDW: 13.6 % (ref 11.5–15.5)
WBC: 7.7 10*3/uL (ref 4.0–10.5)
nRBC: 0 % (ref 0.0–0.2)

## 2019-11-25 LAB — GLUCOSE, CAPILLARY
Glucose-Capillary: 126 mg/dL — ABNORMAL HIGH (ref 70–99)
Glucose-Capillary: 114 mg/dL — ABNORMAL HIGH (ref 70–99)
Glucose-Capillary: 104 mg/dL — ABNORMAL HIGH (ref 70–99)
Glucose-Capillary: 93 mg/dL (ref 70–99)

## 2019-11-25 MED ORDER — SACCHAROMYCES BOULARDII 250 MG PO CAPS
250.0000 mg | ORAL_CAPSULE | Freq: Two times a day (BID) | ORAL | Status: DC
  Administered 2019-11-25 – 2019-12-04 (×19): 250 mg via ORAL
  Filled 2019-11-25 (×19): qty 1

## 2019-11-25 NOTE — Progress Notes (Signed)
[facility] PHYSICAL MEDICINE & REHABILITATION PROGRESS NOTE   Subjective/Complaints:   Intermittent nausea still. Having loose stools as well. Has tough time tolerating protein supps, metamucil.   ROS: Patient denies fever, rash, sore throat, blurred vision,   cough, shortness of breath or chest pain, joint or back pain, headache, or mood change.   Objective:   No results found. Recent Labs    11/25/19 0411  WBC 7.7  HGB 12.1*  HCT 38.9*  PLT 182   Recent Labs    11/25/19 0411  NA 136  K 3.9  CL 96*  CO2 28  GLUCOSE 99  BUN 40*  CREATININE 0.96  CALCIUM 9.7    Intake/Output Summary (Last 24 hours) at 11/25/2019 1207 Last data filed at 11/25/2019 0900 Gross per 24 hour  Intake 680 ml  Output 1950 ml  Net -1270 ml     Physical Exam: Vital Signs Blood pressure 119/62, pulse 83, temperature 97.8 F (36.6 C), resp. rate 19, weight 120.1 kg, SpO2 97 %.  Constitutional: No distress . Vital signs reviewed. HEENT: EOMI, oral membranes moist Neck: supple Cardiovascular: RRR without murmur. No JVD    Respiratory/Chest: CTA Bilaterally without wheezes or rales. Normal effort    GI/Abdomen: BS +, non-tender, non-distended Ext: no clubbing, cyanosis, or edema Psych: flat but cooperative Musculoskeletal:     Cervical back: Normal range of motion and neck supple.     Comments: RUE- delt- 2/5, bicep 2/5, tricep 3-/5, WE 0/5, grip 3-/5, finger 0/5  LUE- deltoid 2/5, biceps 2 to 2-/5, triceps 4-/5, E 3/5, grip 3/5, finger 1/5 LEs B/L- HF 2-/5, KE and KF 2/5, DF 1/5; PF 2/5 Neurological: Alert & oriented x 3. CN exam intact. Reasonable insight and awareness. -  Pt reports sensation decreased in L thumb and RUE; normal in LEs to light touch No hoffman's or clonus B/L  Skin:  Abrasions dressed/exposed, stable  Bottom of feet have a thick black eschar 1/3 of it peeled off- ~ 2-28m thick  half of feet B/L-reassessed today- no change   Assessment/Plan: 1. Functional  deficits secondary to ICU myopathy which require 3+ hours per day of interdisciplinary therapy in a comprehensive inpatient rehab setting.  Physiatrist is providing close team supervision and 24 hour management of active medical problems listed below.  Physiatrist and rehab team continue to assess barriers to discharge/monitor patient progress toward functional and medical goals  Care Tool:  Bathing    Body parts bathed by patient: Abdomen   Body parts bathed by helper: Right arm, Left arm, Chest, Front perineal area, Buttocks, Right upper leg, Left upper leg, Right lower leg, Left lower leg, Face     Bathing assist Assist Level: Total Assistance - Patient < 25%     Upper Body Dressing/Undressing Upper body dressing   What is the patient wearing?: Hospital gown only    Upper body assist Assist Level: Total Assistance - Patient < 25%    Lower Body Dressing/Undressing Lower body dressing      What is the patient wearing?: Hospital gown only     Lower body assist Assist for lower body dressing: Total Assistance - Patient < 25%     Toileting Toileting    Toileting assist Assist for toileting: Total Assistance - Patient < 25%     Transfers Chair/bed transfer  Transfers assist     Chair/bed transfer assist level: 2 Helpers     Locomotion Ambulation   Ambulation assist   Ambulation activity did  not occur: Safety/medical concerns          Walk 10 feet activity   Assist  Walk 10 feet activity did not occur: Safety/medical concerns        Walk 50 feet activity   Assist Walk 50 feet with 2 turns activity did not occur: Safety/medical concerns         Walk 150 feet activity   Assist Walk 150 feet activity did not occur: Safety/medical concerns         Walk 10 feet on uneven surface  activity   Assist Walk 10 feet on uneven surfaces activity did not occur: Safety/medical concerns         Wheelchair     Assist Will patient use  wheelchair at discharge?: Yes Type of Wheelchair: Manual    Wheelchair assist level: Minimal Assistance - Patient > 75% Max wheelchair distance: 50(w/mult rest breaks)    Wheelchair 50 feet with 2 turns activity    Assist    Wheelchair 50 feet with 2 turns activity did not occur: Safety/medical concerns(unable to turn wc)       Wheelchair 150 feet activity     Assist  Wheelchair 150 feet activity did not occur: Safety/medical concerns       Blood pressure 119/62, pulse 83, temperature 97.8 F (36.6 C), resp. rate 19, weight 120.1 kg, SpO2 97 %.  Medical Problem List and Plan: 1.  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              -ELOS/Goals: 3-4 weeks- goals max assist- severely impaired  -Continue CIR PT, OT, SLP 2.  Antithrombotics: -DVT/anticoagulation: Lovenox 60 mg daily.  Vascular study negative             -antiplatelet therapy: N/A 3. Pain Management: Dilaudid 2 mg every 6 hours as needed pain 4. Mood: Melatonin 6 mg nightly, trazodone 100 mg nightly, Klonopin 1 mg nightly, Lexapro 20 mg daily, Valium 2.5 mg every 12 hours as needed anxiety  4/9- will try to get Neuropsych for pt due to anxiety.   4/10: Did not use valium today  4/12- neuropsych on schedule per SW  4/16- saw Neuropsych today- discussed SNF planning.              -antipsychotic agents: N/A 5. Neuropsych: This patient is capable of making decisions on his own behalf. 6. Skin/Wound/ Care/deep tissue pressure injury bilateral heels and lateral feet: Wound care as  directed  4/11: Ordered betadine dressings for bilateral feet as he was receiving in acute care.   4/14- consulted [facility]: they asked for debridement- consulted Dr Sharol Given.  4/16- haven't been able to tell if Dr Sharol Given came ot see pt- no note- will recheck Monday.  4/18- will call Dr Sharol Given Monday to see if can assess- didn't ever see note.      4/19 Dr Sharol Given has seen patient. consvt mgt--->outpt f/u 7. Fluids/Electrolytes/Nutrition: Strict INO's with follow-up chemistries  4/9- labs look good  except BUN still 46- is dry? But is stable/better than was 1 week ago- was 51.  4/12- will recheck labs in AM   4/13- BUN down to 40- doing better- con't to monitor weekly.  4/19. I personally reviewed the patient's labs today.   Labs stable, BUN still 40   -discussed nutrition with patient today, encourage PO fluids 8.  Dysphagia.  Status post PEG tube 09/11/2019 per Dr. Worthy Keeler.  Diet has been advanced to regular.  Patient is still receiving nocturnal tube feeds by PEG tube.  4/9- doing calorie count, to see if can stop TFs  4/13- cannot stop TFs per dietician, since pt has high protein/calorie needs to aid healing, and is Dependent on staff to feed him everything.  9.  Status post tracheostomy 08/06/2019.  Decannulated 11/08/2019. 10.  Hypertension.  Norvasc 10 mg daily, Coreg 25 mg twice daily, Aldactone 25 mg daily.  Monitor with increased mobility  4/9-4/10 BP well controlled- continue regimen. Addendum: Hypotensive to 106/49 at night; decreased amlodipine dose to 40m.   4/13- BP 134/69 this AM- doing better with decreased Norvasc  4/14- Pt's BP 128/70 this AM- doing well cont decreased Norvasc  4/15- BP well cotnrolled- con't meds  4/17- BP 99/52 this AM- will decrease norvasc to 2.5 mg daily.  4/18- BP 104/64- if doesn't improve, suggest stopping Norvasc   4/19 BP a little better today 119/62--no changes 11.  History of asthma.  Continue nebulizers as directed  4/15- will con't to wean O2- is at 1L  currently  4/16- wrote to wean O2 order and told nursing again  4/17- sats 99%- wearing 1L O2- will d/w nursing again- I think it can come off, unless nursing can show he needs it- his sats are 95-99% on 1L. Wrote order to wean, again.  4/18- O2 dropped to 87% today after tolerating therapy without O2- will use prn.    4/19 weaning oxygen as possible. 1.5L this AM. Encouraged deep breathing OOB 12.  GERD.  Protonix 13.  BPH.  Flomax 0.4 mg daily. 14. Morbid obesity- current BMI of 40- will discuss diet, etc once more appropriate.  4/15- BMI down to 38.8- improving  15. Insomnia- con't meds as above  4/9-4/10 sleeps fine- is his normal amount of sleep  4/12- doesn't sleep well, but is chronic- on Klonopin AND trazodone 100 mg QHS 16. Right ear wax: Debrox ear drops 17. Nausea/loose stool  4/18- will schedule Zofran at 0600 am to help with AM nausea.  4/19 already on fiber. Will add florastor as well to improve stool consistency   LOS: 11 days A FACE TO FACE EVALUATION WAS PERFORMED  ZMeredith Staggers4/19/2021, 12:07 PM

## 2019-11-25 NOTE — Progress Notes (Signed)
Occupational Therapy Weekly Progress Note  Patient Details  Name: [name] MRN: [id number] Date of Birth: 1975-10-01  Beginning of progress report period: November 15, 2019 End of progress report period: November 25, 2019  Today's Date: 11/25/2019 OT Individual Time: 0900-1000   & 1345-1415 OT Individual Time Calculation (min): 60 min & 30 min   Patient has met 3 of 4 short term goals.  Patient making progress with trunk control, UB and LB strength and mobility, balance and general endurance which enables him to complete more of self care and mobility tasks.    Patient continues to demonstrate the following deficits: muscle weakness and muscle joint tightness, decreased cardiorespiratoy endurance, unbalanced muscle activation, decreased coordination and decreased motor planning and decreased sitting balance, decreased standing balance and decreased postural control and therefore will continue to benefit from skilled OT intervention to enhance overall performance with BADL, iADL and Reduce care partner burden.  Patient progressing toward long term goals..  Continue plan of care.  OT Short Term Goals Week 1:  OT Short Term Goal 1 (Week 1): Pt will transfer to w/c in prep for [facility] transfer wiht MAX A +2 in prep for toileting OT Short Term Goal 1 - Progress (Week 1): Progressing toward goal OT Short Term Goal 2 (Week 1): Pt will roll in B directions with MAX A of 1 caregiver to decrease BOC during LB dressing OT Short Term Goal 2 - Progress (Week 1): Met OT Short Term Goal 3 (Week 1): seated upright, pt will bring L hand to mouth in prep for self feeding/grooming OT Short Term Goal 3 - Progress (Week 1): Met OT Short Term Goal 4 (Week 1): Pt will thread 1UE into shirt with S OT Short Term Goal 4 - Progress (Week 1): Met Week 2:  OT Short Term Goal 1 (Week 2): patient will complete supine to sitting position with mod A of one OT Short Term Goal 2 (Week 2): patient will complete rolling in bed  with min A OT Short Term Goal 3 (Week 2): patient will complete UB dressing with min A OT Short Term Goal 4 (Week 2): patient will complete grooming and eating tasks with min a  Skilled Therapeutic Interventions/Progress Updates:    AM session:   Patient in bed, wife present, denies pain at this time.  Reviewed and practiced with assistive devices for eating using both right and left hands.  He is able to feed himself bed level with HOB elevated after set up.   LB dressing max A.  Footwear dependent.  Rolling in bed with mod/max A of one.  Side lying to sitting edge of bed with max A of one and min a of 2nd.  SB transfer bed to w/c with max A of 2 - note improved trunk control and positioning of bilateral lower legs.  He remained in the TIS w/c in tilted position at close of session with call bell and tray table in reach.     PM session:   Patient in bed, alert and ready for therapy session.  Mom present for session.  Completed bilateral UB AROM/stretching and isometric exercises to facilitate improved reach and functional use of bilateral UEs during self care and transfers.  Note improved mobility t/o.  He is able to complete B shoulder flexion to 90 with support of bed surface, extend bilateral elbows against gravity, full B elbow flexion against gravity with proximal support, functional grasp in left hand, improving wrist extension and mobility  in right hand.  With elevated HOB practiced pulling trunk forward using left lower rail (max A for first 2 attempts, min/mod a additional 3 attempts)   Reviewed activities that he can complete on his own.  He remained in bed at close of session with tray table and call bell in reach.    Therapy Documentation Precautions:  Precautions Precautions: Fall Precaution Comments: bilat wounds/feet, PEG Required Braces or Orthoses: Other Brace Other Brace: B prevalon boots; R wrist cock-up splint Restrictions Weight Bearing Restrictions: No RLE Weight Bearing:  Non weight bearing LLE Weight Bearing: Non weight bearing Other Position/Activity Restrictions: to be clarified due to bilat wounds plantar and lateral aspect of feet General:   Vital Signs: Therapy Vitals Temp: 98.7 F (37.1 C) Pulse Rate: 97 Resp: 18 BP: (!) 108/54 Patient Position (if appropriate): Lying Oxygen Therapy SpO2: (!) 86 % O2 Device: Room Air Pain: Pain Assessment Pain Scale: 0-10 Pain Score: 3  Pain Type: Acute pain Pain Location: Back Pain Descriptors / Indicators: Discomfort Pain Intervention(s): Repositioned   Therapy/Group: Individual Therapy  Carlos Levering 11/25/2019, 3:54 PM

## 2019-11-25 NOTE — Progress Notes (Addendum)
Physical Therapy Session Note  Patient Details  Name: [name] [name] MRN: [id number] Date of Birth: Nov 02, 1975  Today's Date: 11/25/2019 PT Individual Time: 1102-1220 PT Individual Time Calculation (min): 78 min   Short Term Goals: Week 2:  PT Short Term Goal 1 (Week 2): Pt will transition supine to sit using bed features w/min assist of 1 PT Short Term Goal 2 (Week 2): Pt will perform sit to lean on elbow to sit for placement of SB w/cga only PT Short Term Goal 3 (Week 2): bed to/from wc w/mod assist of 2 PT Short Term Goal 4 (Week 2): Pt wil tolerate 5 min continuous cardiovascular activity  Skilled Therapeutic Interventions/Progress Updates:     Patient in TIS w/c with his wife in the room upon PT arrival. Patient alert and agreeable to PT session. Patient reported 4/10 [name] achilles pain during session, RN made aware. PT provided repositioning, rest breaks, and distraction as pain interventions throughout session. Noted significant redness and inflammation on the skin over his R knee and calf from scratching. Patient reported that he feels "very itchy," RN made aware and wife plans to assist with cutting his nails tomorrow.  Patient on 0.5L/min O2, SPO2 96% at beginning of session. Placed patient on RA and O2 sats remained >92%.   Therapeutic Activity: Bed Mobility: Patient performed sit to supine with max A of 1 person for LE managment. Provided verbal cues for controlling trunk with UEs. He required max A +2 for scooting up in the bed with patient using B LEs to push himself up. Performed rolling R with mod A of 1 person with use of bed rail to adjust shorts and chuck pad with total A for comfort. Transfers: Patient performed a slide board transfer TIS w/c>bed with max A +2 and total A for board placement. Provided cues for hand placement, board placement, and head-hips relationship for proper technique and decreased assist with transfers. Noted increased LE and UE activation during transfer  today, however, patient requires increased time and assist due to overall deconditioning.  Neuromuscular Re-ed: Patient performed the following UE motor control and sitting balance/trunk stabilization activities: -seated in TIS w/c patient performed modified crunches from resting against the back rest to sitting up-right in the chair 2x4 for improved trunk control and core strengthening -patient focused on [name] and R UE grip using a styrofoam cup with a straw to drink an ensure, required min-mod A for bringing hand toward his mouth and close supervision for gripping the cup -B elbow flexion/extension with mod progressing to min assist on [name] and mod A throughout on R for going through full ROM 2x5 -sitting balance edge of seat and EOB working on scapular retraction and cervical retraction with extension to neutral for improved posture and sitting tolerance. While seated EOB placed B LEs on 2" step and progressed knee flexion throughout for improved ROM  Patient on RA throughout session, SPO2 >90% throughout.  Patient in bed with his mother at bedside at end of session with breaks locked, bed alarm set, and all needs within reach. Patient became tearful and emotional at end of session. He reported feeling upset about his performance and progress and stated that he would like to continue working with therapy in CIR as long as possible to improve his level of function and independence. PT comforted patient and encouraged him about his current progress, discussed overall deconditioning form extended hospital stay, and factors that could extend his stay in CIR, including increasing his therapy  hours and continuing to show progress with therapies. Informed patient that PT will communicate with rehab team about patient's request tomorrow in conference. Patient appreciative of this discussion and in much better spirits after.    Therapy Documentation Precautions:  Precautions Precautions: Fall Precaution  Comments: bilat wounds/feet, PEG Required Braces or Orthoses: Other Brace Other Brace: B prevalon boots; R wrist cock-up splint Restrictions Weight Bearing Restrictions: No RLE Weight Bearing: Non weight bearing LLE Weight Bearing: Non weight bearing Other Position/Activity Restrictions: to be clarified due to bilat wounds plantar and lateral aspect of feet    Therapy/Group: Individual Therapy  [name] [name] [name] [name] PT, DPT  11/25/2019, 3:38 PM

## 2019-11-25 NOTE — Progress Notes (Incomplete Revision)
Physical Therapy Session Note ? ?Patient Details  ?Name: [name] ?MRN: [id number] ?Date of Birth: 1976-02-01 ? ?Today's Date: 11/25/2019 ?PT Individual Time: 9509-3267 ?PT Individual Time Calculation (min): 78 min  ? ?Short Term Goals: ?Week 2:  PT Short Term Goal 1 (Week 2): Pt will transition supine to sit using bed features w/min assist of 1 ?PT Short Term Goal 2 (Week 2): Pt will perform sit to lean on elbow to sit for placement of SB w/cga only ?PT Short Term Goal 3 (Week 2): bed to/from wc w/mod assist of 2 ?PT Short Term Goal 4 (Week 2): Pt wil tolerate 5 min continuous cardiovascular activity ? ?Skilled Therapeutic Interventions/Progress Updates:  ?   ?Patient in [place] w/c with his wife in the room upon PT arrival. Patient alert and agreeable to PT session. Patient reported 4/10 L achilles pain during session, RN made aware. PT provided repositioning, rest breaks, and distraction as pain interventions throughout session. Noted significant redness and inflammation on the skin over his R knee and calf from scratching. Patient reported that he feels "very itchy," RN made aware and wife plans to assist with cutting his nails tomorrow.  ?Patient on 0.5L/min O2, SPO2 96% at beginning of session. Placed patient on RA and O2 sats remained >92%.  ? ?Therapeutic Activity: ?Bed Mobility: Patient performed sit to supine with max A of 1 person for LE managment. Provided verbal cues for controlling trunk with UEs. He required max A +2 for scooting up in the bed with patient using B LEs to push himself up. Performed rolling R with mod A of 1 person with use of bed rail to adjust shorts and chuck pad with total A for comfort. ? Transfers: Patient performed a slide board transfer TIS w/c>bed with max A +2 and total A for board placement. Provided cues for hand placement, board placement, and head-hips relationship for proper technique and decreased assist with transfers. Noted increased LE and UE activation during transfer today, however, patient requires increased time an

## 2019-11-25 NOTE — Plan of Care (Signed)
  Problem: Education: Goal: Required Educational Video(s) Outcome: Progressing   Problem: Consults Goal: RH GENERAL PATIENT EDUCATION Description: See Patient Education module for education specifics. Outcome: Progressing Goal: Skin Care Protocol Initiated - if Braden Score 18 or less Description: If consults are not indicated, leave blank or document N/A Outcome: Progressing   Problem: RH BOWEL ELIMINATION Goal: RH STG MANAGE BOWEL WITH ASSISTANCE Description: STG Manage Bowel with min Assistance. Outcome: Progressing   Problem: RH BLADDER ELIMINATION Goal: RH STG MANAGE BLADDER WITH ASSISTANCE Description: STG Manage Bladder With min Assistance Outcome: Progressing   Problem: RH SKIN INTEGRITY Goal: RH STG SKIN FREE OF INFECTION/BREAKDOWN Description: Skin will be free of infection/breakdown with mod assistance Outcome: Progressing Goal: RH STG MAINTAIN SKIN INTEGRITY WITH ASSISTANCE Description: STG Maintain Skin Integrity With mod Assistance. Outcome: Progressing Goal: RH STG ABLE TO PERFORM INCISION/WOUND CARE W/ASSISTANCE Description: STG Able To Perform Incision/Wound Care With total Assistance. Outcome: Progressing   Problem: RH SAFETY Goal: RH STG ADHERE TO SAFETY PRECAUTIONS W/ASSISTANCE/DEVICE Description: STG Adhere to Safety Precautions With min Assistance/Device. Outcome: Progressing Goal: RH STG DECREASED RISK OF FALL WITH ASSISTANCE Description: STG Decreased Risk of Fall With max Assistance. Outcome: Progressing   Problem: RH PAIN MANAGEMENT Goal: RH STG PAIN MANAGED AT OR BELOW PT'S PAIN GOAL Description: Pain will be less than 3 out of 10 on pain scale. Pt will be able to verbalize when in pain  Outcome: Progressing

## 2019-11-25 NOTE — Progress Notes (Addendum)
Advanced Heart Failure Rounding Note  PCP-Cardiologist: No primary care provider on file.   Subjective:    Admitted to CIR on 4/8     Denies SOB. Able to stand on the end of the bed with PT.   Objective:   Weight Range: 120.1 kg Body mass index is 40.26 kg/m.   Vital Signs:   Temp:  [97.8 F (36.6 C)-98.3 F (36.8 C)] 97.8 F (36.6 C) (04/19 0452) Pulse Rate:  [76-100] 83 (04/19 0906) Resp:  [18-20] 19 (04/19 0452) BP: (104-119)/(50-64) 119/62 (04/19 0906) SpO2:  [87 %-97 %] 97 % (04/19 0452) Weight:  [120.1 kg] 120.1 kg (04/19 0500) Last BM Date: 11/24/19  Weight change: Filed Weights   11/21/19 0322 11/22/19 0621 11/25/19 0500  Weight: 115.8 kg 116.2 kg 120.1 kg    Intake/Output:   Intake/Output Summary (Last 24 hours) at 11/25/2019 0941 Last data filed at 11/25/2019 0830 Gross per 24 hour  Intake 440 ml  Output 1950 ml  Net -1510 ml      Physical Exam   General:  In bed. No resp difficulty. Eating breakfast.  HEENT: normal Neck: supple. no JVD. Carotids 2+ bilat; no bruits. No lymphadenopathy or thryomegaly appreciated. Cor: PMI nondisplaced. Regular rate & rhythm. No rubs, gallops or murmurs. Lungs: clear Abdomen: soft, nontender, nondistended. No hepatosplenomegaly. No bruits or masses. Good bowel sounds. Extremities: no cyanosis, clubbing, rash, edema. RUE weakness. Lower extremity weakness.  Neuro: alert & orientedx3, cranial nerves grossly intact. moves all 4 extremities w/o difficulty. Affect flat   Labs    CBC Recent Labs    11/25/19 0411  WBC 7.7  NEUTROABS 4.6  HGB 12.1*  HCT 38.9*  MCV 88.6  PLT 517   Basic Metabolic Panel Recent Labs    11/25/19 0411  NA 136  K 3.9  CL 96*  CO2 28  GLUCOSE 99  BUN 40*  CREATININE 0.96  CALCIUM 9.7   Liver Function Tests Recent Labs    11/25/19 0411  AST 14*  ALT 13  ALKPHOS 66  BILITOT 0.2*  PROT 7.4  ALBUMIN 2.7*   No results for input(s): LIPASE, AMYLASE in the last 72  hours. Cardiac Enzymes No results for input(s): CKTOTAL, CKMB, CKMBINDEX, TROPONINI in the last 72 hours.  BNP: BNP (last 3 results) Recent Labs    07/22/19 1039  BNP 15.3    ProBNP (last 3 results) No results for input(s): PROBNP in the last 8760 hours.   D-Dimer No results for input(s): DDIMER in the last 72 hours. Hemoglobin A1C No results for input(s): HGBA1C in the last 72 hours. Fasting Lipid Panel No results for input(s): CHOL, HDL, LDLCALC, TRIG, CHOLHDL, LDLDIRECT in the last 72 hours. Thyroid Function Tests No results for input(s): TSH, T4TOTAL, T3FREE, THYROIDAB in the last 72 hours.  Invalid input(s): FREET3  Other results:   Imaging    No results found.   Medications:     Scheduled Medications: . amLODipine  2.5 mg Oral Daily  . vitamin C  500 mg Oral Daily  . carbamide peroxide  5 drop Both EARS BID  . carvedilol  25 mg Oral BID WC  . chlorhexidine  15 mL Mouth Rinse BID  . Chlorhexidine Gluconate Cloth  6 each Topical Daily  . clonazepam  1 mg Oral QHS  . enoxaparin (LOVENOX) injection  60 mg Subcutaneous Q24H  . escitalopram  20 mg Oral Daily  . feeding supplement (ENSURE ENLIVE)  237 mL Oral TID  BM  . feeding supplement (OSMOLITE 1.5 CAL)  1,000 mL Per Tube Q24H  . feeding supplement (PRO-STAT SUGAR FREE 64)  60 mL Per Tube QID  . Gerhardt's butt cream   Topical Daily  . insulin aspart  0-15 Units Subcutaneous TID AC & HS  . ketotifen  1 drop Both Eyes BID  . loratadine  10 mg Oral Daily  . melatonin  6 mg Oral QHS  . montelukast  10 mg Oral QHS  . nutrition supplement (JUVEN)  1 packet Per Tube BID BM  . ondansetron  4 mg Oral Daily  . pantoprazole  40 mg Oral Daily  . polycarbophil  625 mg Oral Daily  . psyllium  1 packet Oral TID  . sodium chloride flush  10-40 mL Intracatheter Q12H  . spironolactone  25 mg Oral Daily  . tamsulosin  0.4 mg Oral QPC supper  . traZODone  100 mg Oral QHS    Infusions:   PRN  Medications: acetaminophen, albuterol, calcium carbonate, diazepam, HYDROmorphone, ipratropium-albuterol, loperamide HCl, ondansetron, sodium chloride, sodium chloride flush, sorbitol    Assessment/Plan   1.H/O Acute hypoxic/hypercarbic respiratory failure due to COVID PNA: Remained markedly hypoxic despite full support. Intubated 12/26. S/p bilateral CTs 12/26 for pneumothoraces.  TEE on 12/26 LVEF 70% RV ok. VV ECMO begun 12/26. Tracheostomy 12/29.  COVID ARDS at this point. ECMO circuit changed 1/8 & 1/23. Crescent cannula placed 2/2 (femoral cannuals removed) - ECMO decannulated 3/4 - Trach decannulated 11/08/19 - Stable on room air.   2. COVID PNA: management of COVID infection per CCM. CXR with bilateral multifocal PNA progressing to likely ARDS.  - s/p 10 days of remdesivir - 12/16, 12/2 s/p tociluzimab - 12/17 s/p convalescent plasma - Decadron completed 1/11 - Resolved.   3. FEN - Now on D3 diet. -Speech following.  - Ongoing nocturnal feeds.  - no change   4. Hypernatremia Resolved.   5. DTIs - On both feet.  Continue off load.  - WOC following  6.  Hypokelmia  Resolved.   7.  Diffuse muscle weakness. CIR appreciated  PT/OT/Speech working with him .   8. HTN -Stable. Continue current regimen.   10. Urinary retention - Continue Flomax   HF Team will follow from a distance.  Length of Stay: [place], NP  11/25/2019, 9:41 AM  Advanced Heart Failure Team Pager [phone] (M-F; 7a - 4p)  Please contact [facility] for night-coverage after hours (4p -7a ) and weekends on [email or web address]  Patient seen and examined with the above-signed Advanced Practice Provider and/or Housestaff. I personally reviewed laboratory data, imaging studies and relevant notes. I independently examined the patient and formulated the important aspects of the plan. I have edited the note to reflect any of my changes or salient points. I have personally discussed the plan with the  patient and/or family.  I have been out for a week and returned today to see Kalvin. He is much improved. Now able to lift his arms to his head and feed himself. Getting into the [facility] several times per day and able to stand in the bed. Respiratory status is stable. BP and HR ok. Seen by Dr. Sharol Given for feet wounds and expected to heal well.   On exam No JVD. Trach site well healed Cardiac regular and lungs clear, PEG site ok. No LE edema.   Very much appreciate CIR's care. No new recs from HF perspective.   Glori Bickers, MD  8:30 PM

## 2019-11-25 NOTE — Consult Note (Signed)
[facility] Nurse wound follow up Patient receiving care in [facility] 4W11. 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  Began many weeks ago while in 2 H during height of his COVID 19 infection.  Staff were standing him for extended periods of time to facilitate oxygenation.   Measurement: roughly unchanged appearance and size of bilateral foot eschars since last week Wound bed: Dry, hard, stable eschar Drainage (amount, consistency, odor) no drainage. Foam dressings in place bilaterally Periwound: intact, flaking skin Dressing procedure/placement/frequency: Continue topical approach on record (iodine to areas, protect from further injury).  Awaiting input from Dr. Sharol Given.  Appreciate his insights pertaining to possible debridement at this time. Monitor the wound area(s) for worsening of condition such as: Signs/symptoms of infection,  Increase in size,  Development of or worsening of odor, Development of pain, or increased pain at the affected locations.  Notify the medical team if any of these develop. Antony Contras, RN, MSN, CWOCN, CNS-BC, pager [phone]

## 2019-11-25 NOTE — Consult Note (Signed)
ORTHOPAEDIC CONSULTATION  REQUESTING PHYSICIAN: Courtney Heys, MD  Chief Complaint: Bilateral foot plantar eschars.  HPI: [name] is a 44 y.o. male who presents with unstageable plantar eschar involving the entire plantar lateral aspect of both feet.  Eschars seem to be secondary to acute vascular insult from his Covid infection.  Past Medical History:  Diagnosis Date  . Asthma   . GERD (gastroesophageal reflux disease)   . Headache   . History of kidney stones    LEFT URETERAL STONE  . HTN (hypertension)   . Pancreatitis 2018   GALLBALDDER SLUDGE CAUSED ISSUED RESOLVED   Past Surgical History:  Procedure Laterality Date  . CANNULATION FOR ECMO (EXTRACORPOREAL MEMBRANE OXYGENATION) N/A 08/28/2019   Procedure: CANNULATION FOR VV ECMO (EXTRACORPOREAL MEMBRANE OXYGENATION);  Surgeon: Prescott Gum, Collier Salina, MD;  Location: [place];  Service: Open Heart Surgery;  Laterality: N/A;  CRESCENT CANNULA  . CANNULATION FOR ECMO (EXTRACORPOREAL MEMBRANE OXYGENATION) N/A 09/10/2019   Procedure: CANNULATION FOR ECMO (EXTRACORPOREAL MEMBRANE OXYGENATION) PUTTING IN CRESCENT 32FR CANNULA  AND REMOVING GROING CANNULATION;  Surgeon: Wonda Olds, MD;  Location: [place];  Service: Open Heart Surgery;  Laterality: N/A;  PUTTING IN CRESCENT/REMOVING GROIN CANNULATION  . CYSTOSCOPY/URETEROSCOPY/HOLMIUM LASER/STENT PLACEMENT Left 04/18/2019   Procedure: LEFT URETEROSCOPY/HOLMIUM LASER/STENT PLACEMENT;  Surgeon: Ardis Hughs, MD;  Location: [facility];  Service: Urology;  Laterality: Left;  . CYSTOSCOPY/URETEROSCOPY/HOLMIUM LASER/STENT PLACEMENT Left 05/02/2019   Procedure: CYSTOSCOPY/URETEROSCOPY/HOLMIUM LASER/STENT EXCHANGE;  Surgeon: Ardis Hughs, MD;  Location: [facility];  Service: Urology;  Laterality: Left;  . ECMO CANNULATION N/A 08/03/2019   Procedure: ECMO CANNULATION;  Surgeon: Wonda Olds, MD;  Location: [place] [facility];  Service: Cardiovascular;  Laterality:  N/A;  . ESOPHAGOGASTRODUODENOSCOPY N/A 09/11/2019   Procedure: ESOPHAGOGASTRODUODENOSCOPY (EGD);  Surgeon: Wonda Olds, MD;  Location: [place];  Service: Thoracic;  Laterality: N/A;  . IR [place] Vivianne Master  10/14/2019  . IRRIGATION AND DEBRIDEMENT SHOULDER Right 09/29/2017   Procedure: IRRIGATION AND DEBRIDEMENT SHOULDER;  Surgeon: Leandrew Koyanagi, MD;  Location: [place];  Service: Orthopedics;  Laterality: Right;  . [place] SURGERY  2002  . NASAL ENDOSCOPY WITH EPISTAXIS CONTROL N/A 08/31/2019   Procedure: NASAL ENDOSCOPY WITH EPISTAXIS CONTROL WITH CAUTERIZATION;  Surgeon: Melida Quitter, MD;  Location: [place];  Service: ENT;  Laterality: N/A;  . PORTACATH PLACEMENT N/A 09/11/2019   Procedure: PEG TUBE INSERTION - BEDSIDE;  Surgeon: Wonda Olds, MD;  Location: [place];  Service: Thoracic;  Laterality: N/A;  . TEE WITHOUT CARDIOVERSION N/A 08/28/2019   Procedure: TRANSESOPHAGEAL ECHOCARDIOGRAM (TEE);  Surgeon: Prescott Gum, Collier Salina, MD;  Location: [place];  Service: Open Heart Surgery;  Laterality: N/A;  . TEE WITHOUT CARDIOVERSION N/A 09/10/2019   Procedure: TRANSESOPHAGEAL ECHOCARDIOGRAM (TEE);  Surgeon: Wonda Olds, MD;  Location: [place];  Service: Open Heart Surgery;  Laterality: N/A;   Social History   Socioeconomic History  . Marital status: Married    Spouse name: Not on file  . Number of children: 1  . Years of education: Not on file  . Highest education level: Not on file  Occupational History  . Occupation: delivery driver  Tobacco Use  . Smoking status: Never Smoker  . Smokeless tobacco: Never Used  Substance and Sexual Activity  . Alcohol use: Yes    Comment: rarely 2-3 times a year  . Drug use: No  . Sexual activity: Yes    Partners: Female  Other Topics Concern  . Not on file  Social History Narrative  . Not on file   Social Determinants of Health   Financial Resource Strain:   . Difficulty of Paying Living Expenses:   Food Insecurity:   .  Worried About Charity fundraiser in the Last Year:   . Arboriculturist in the Last Year:   Transportation Needs:   . Film/video editor (Medical):   Marland Kitchen Lack of Transportation (Non-Medical):   Physical Activity:   . Days of Exercise per Week:   . Minutes of Exercise per Session:   Stress:   . Feeling of Stress :   Social Connections:   . Frequency of Communication with Friends and Family:   . Frequency of Social Gatherings with Friends and Family:   . Attends Religious Services:   . Active Member of Clubs or Organizations:   . Attends Archivist Meetings:   Marland Kitchen Marital Status:    Family History  Problem Relation Age of Onset  . Asthma Brother   . Diabetes Father   . Hypertension Father   . Diabetes Paternal Grandmother   . Hypertension Paternal Grandmother   . Migraines Mother   . GER disease Mother   . Pancreatic cancer Maternal Grandmother   . Heart disease Maternal Grandfather    - negative except otherwise stated in the family history section No Known Allergies Prior to Admission medications   Medication Sig Start Date End Date Taking? Authorizing Provider  acetaminophen (TYLENOL) 325 MG tablet Place 2 tablets (650 mg total) into feeding tube every 6 (six) hours as needed for mild pain or headache (fever >/= 101). 11/14/19   Rosita Fire, Brittainy M, PA-C  Albuterol Sulfate (PROAIR RESPICLICK) 448 (90 Base) MCG/ACT AEPB Inhale 2 puffs into the lungs every 4 (four) hours as needed (Shortness of breath). 05/28/19   Valentina Shaggy, MD  amLODipine (NORVASC) 10 MG tablet Take 1 tablet (10 mg total) by mouth daily. 11/15/19   Lyda Jester M, PA-C  ascorbic acid (VITAMIN C) 500 MG tablet Take 1 tablet (500 mg total) by mouth daily. 11/15/19   Lyda Jester M, PA-C  calcium carbonate (TUMS - DOSED IN MG ELEMENTAL CALCIUM) 500 MG chewable tablet Chew 1 tablet (200 mg of elemental calcium total) by mouth 3 (three) times daily as needed for indigestion or heartburn.  11/14/19   Lyda Jester M, PA-C  carvedilol (COREG) 25 MG tablet Take 1 tablet (25 mg total) by mouth 2 (two) times daily with a meal. 11/14/19   Lyda Jester M, PA-C  cetirizine (ZYRTEC) 10 MG tablet Take 1 tablet (10 mg total) by mouth daily. 02/06/18   Kozlow, Donnamarie Poag, MD  clonazePAM (KLONOPIN) 1 MG disintegrating tablet Take 1 tablet (1 mg total) by mouth at bedtime. 11/14/19   Lyda Jester M, PA-C  escitalopram (LEXAPRO) 20 MG tablet Take 1 tablet (20 mg total) by mouth daily. 11/15/19   Lyda Jester M, PA-C  hydrALAZINE (APRESOLINE) 20 MG/ML injection Inject 1 mL (20 mg total) into the vein every 4 (four) hours as needed. 11/14/19   Consuelo Pandy, PA-C  loperamide HCl (IMODIUM) 1 MG/7.5ML suspension Place 15 mLs (2 mg total) into feeding tube as needed for diarrhea or loose stools. 11/14/19   Lyda Jester M, PA-C  melatonin 3 MG TABS tablet Take 2 tablets (6 mg total) by mouth at bedtime. 11/14/19   Lyda Jester M, PA-C  montelukast (SINGULAIR) 10 MG tablet Take  1 tablet (10 mg total) by mouth at bedtime. 11/14/19   Lyda Jester M, PA-C  pantoprazole (PROTONIX) 40 MG tablet Take 1 tablet (40 mg total) by mouth daily. 11/15/19   Lyda Jester M, PA-C  polycarbophil (FIBERCON) 625 MG tablet Take 1 tablet (625 mg total) by mouth daily. 11/15/19   Lyda Jester M, PA-C  psyllium (HYDROCIL/METAMUCIL) 95 % PACK Take 1 packet by mouth 3 (three) times daily. 11/14/19   Lyda Jester M, PA-C  sodium chloride (OCEAN) 0.65 % SOLN nasal spray Place 1 spray into both nostrils as needed for congestion. 11/14/19   Consuelo Pandy, PA-C  spironolactone (ALDACTONE) 25 MG tablet Take 1 tablet (25 mg total) by mouth daily. 11/15/19   Lyda Jester M, PA-C  SYMBICORT 160-4.5 MCG/ACT inhaler Inhale 2 puffs into the lungs daily as needed (cough or wheeze). Inhale two puffs ONCE daily to prevent cough or wheeze. Rinse, gargle, and spit after use. 08/20/19   Valentina Shaggy, MD  tamsulosin (FLOMAX) 0.4 MG CAPS capsule Take 1 capsule (0.4 mg total) by mouth daily after supper. 11/14/19   Lyda Jester M, PA-C  traZODone (DESYREL) 100 MG tablet Take 1 tablet (100 mg total) by mouth at bedtime. 11/14/19   Lyda Jester M, PA-C  triamcinolone (NASACORT ALLERGY 24HR) 55 MCG/ACT AERO nasal inhaler Place 2 sprays into the nose daily. Patient taking differently: Place 2 sprays into the nose daily as needed (allergies).  02/06/18   Kozlow, Donnamarie Poag, MD   No results found. - pertinent xrays, CT, MRI studies were reviewed and independently interpreted  Positive ROS: All other systems have been reviewed and were otherwise negative with the exception of those mentioned in the HPI and as above.  Physical Exam: General: Alert, no acute distress Psychiatric: Patient is competent for consent with normal mood and affect Lymphatic: No axillary or cervical lymphadenopathy Cardiovascular: No pedal edema Respiratory: No cyanosis, no use of accessory musculature GI: No organomegaly, abdomen is soft and non-tender    Images:  @ENCIMAGES @  Labs:  Lab Results  Component Value Date   HGBA1C 5.5 09/23/2019   HGBA1C 6.7 (H) 07/24/2019   HGBA1C 5.7 (H) 09/30/2017   ESRSEDRATE 56 (H) 10/11/2019   ESRSEDRATE 6 12/12/2017   ESRSEDRATE 75 (H) 09/30/2017   CRP 13.9 (H) 10/11/2019   CRP 0.7 08/01/2019   CRP 1.7 (H) 07/31/2019   REPTSTATUS 10/16/2019 FINAL 10/11/2019   REPTSTATUS 10/16/2019 FINAL 10/11/2019   GRAMSTAIN  10/09/2019    MODERATE WBC PRESENT, PREDOMINANTLY PMN NO ORGANISMS SEEN    GRAMSTAIN  10/09/2019    FEW WBC PRESENT,BOTH PMN AND MONONUCLEAR NO ORGANISMS SEEN    CULT  10/11/2019    NO GROWTH 5 DAYS Performed at [facility], [place] [address], [place], [place] [address]    CULT  10/11/2019    NO GROWTH 5 DAYS Performed at [facility] [address] [place], [place] [address]    LABORGA SERRATIA MARCESCENS (A) 09/23/2019    Lab  Results  Component Value Date   ALBUMIN 2.7 (L) 11/25/2019   ALBUMIN 2.8 (L) 11/15/2019   ALBUMIN 1.8 (L) 10/10/2019    Neurologic: Patient does not have protective sensation bilateral lower extremities.   MUSCULOSKELETAL:   Skin: Examination there is thick unstageable eschar on the plantar lateral aspect of both feet.  The wound edges are healthy and viable the eschar is thick nonmovable and unstageable.  Patient does have palpable pulses.  No forefoot  or dorsal foot skin abnormalities.  Assessment: Assessment: Acute vascular insult secondary to COVID-19 with large unstageable black eschars on the plantar lateral aspect of both feet.  Plan: Plan: Continue routine wound care do not feel like surgical intervention is necessary at this time, ideally this should heal uneventfully with epithelization beneath the eschar.  I will follow-up as an outpatient.  Thank you for the consult and the opportunity to see [name], [place] [phone] 11:32 AM

## 2019-11-25 NOTE — NC FL2 (Signed)
[place] MEDICAID FL2 LEVEL OF CARE SCREENING TOOL     IDENTIFICATION  Patient Name: [name] Birthdate: November 25, 1975 Sex: male Admission Date (Current Location): 11/14/2019  [facility] and [place] Number:  Herbalist and Address:  [facility] [place], [place] [name], [place], [place] [address]      Provider Number: [id number]  Attending Physician Name and Address:  Courtney Heys, MD  Relative Name and Phone Number:  Kerman Passey #[phone]    Current Level of Care: Hospital Recommended Level of Care: Nursing Facility Prior Approval Number:    Date Approved/Denied:   PASRR Number:    Discharge Plan: SNF    Current Diagnoses: Patient Active Problem List   Diagnosis Date Noted  . Eschar of foot   . 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  . History of COVID-19   . ARDS (adult respiratory distress syndrome) ([place])   . Pressure injury of skin 08/22/2019  . Need for emotional support   . Chest tube in place   . Personal history of ECMO   . Advanced care planning/counseling discussion   . Acute respiratory distress syndrome (ARDS) due to COVID-19 virus ([place])   . Palliative care by specialist   . Goals of care, counseling/discussion   . Advanced directives, counseling/discussion   . Subcutaneous crepitus   . Pneumonia due to COVID-19 virus 07/23/2019  . Acute respiratory failure ([place]) 07/22/2019  . Persistent asthma with undetermined severity   . Thrombocytopenia ([place])   . Leukopenia   . Fever   . Gram positive bacterial infection   . Septic arthritis of right acromioclavicular joint ([place]) 09/29/2017  . Chronic left-sided low back pain with left-sided sciatica 09/19/2017  . Epigastric pain   . Pancreatitis 04/17/2017  . Essential hypertension 04/17/2017  . Moderate persistent asthma 07/21/2015  . Allergic rhinoconjunctivitis 07/21/2015  . GERD (gastroesophageal reflux disease) 07/21/2015  . ABNORMALITY OF GAIT 11/12/2009  . TARSAL  TUNNEL SYNDROME, LEFT 10/08/2009  . PES PLANUS 10/08/2009    Orientation RESPIRATION BLADDER Height & Weight     Self, Time, Situation, Place  O2, Other (Comment)(.5 L [place]- currently weaning.) Continent(Pt aware when he needs to go bathroom but nusing staff hold urinal) Weight: 264 lb 12.4 oz (120.1 kg) Height:     BEHAVIORAL SYMPTOMS/MOOD NEUROLOGICAL BOWEL NUTRITION STATUS      Continent(Pt aware on when he needs to use the bathroom but nursing has to place the bed pan) Supplemental, Feeding tube(30f nocturnal feeds 9/9 @75mL /per hr)  AMBULATORY STATUS COMMUNICATION OF NEEDS Skin   Total Care Verbally Other (Comment)(1) Redness on bottom- apply butt cream; 2) feet- eschar/long black scabs on the outer part of his feet)                       Personal Care Assistance Level of Assistance  Total care(can feed himself at times) Bathing Assistance: Maximum assistance   Dressing Assistance: Maximum assistance Total Care Assistance: Maximum assistance   Functional Limitations Info             SPECIAL CARE FACTORS FREQUENCY  PT (By licensed PT), OT (By licensed OT), Speech therapy     PT Frequency: 5xs per week OT Frequency: 5xs per week     Speech Therapy Frequency: 5xs per week      Contractures      Additional Factors Info  Current Medications (11/25/2019):  This is the current hospital active medication list Current Facility-Administered Medications  Medication Dose Route Frequency Provider Last Rate Last Admin  . acetaminophen (TYLENOL) tablet 650 mg  650 mg Per Tube Q6H PRN Cathlyn Parsons, PA-C   650 mg at 11/15/19 1425  . albuterol (PROVENTIL) (2.5 MG/3ML) 0.083% nebulizer solution 2.5 mg  2.5 mg Nebulization Q4H PRN Angiulli, Lavon Paganini, PA-C      . amLODipine (NORVASC) tablet 2.5 mg  2.5 mg Oral Daily Lovorn, Megan, MD   2.5 mg at 11/25/19 0911  . ascorbic acid (VITAMIN C) tablet 500 mg  500 mg Oral Daily Cathlyn Parsons, PA-C   500  mg at 11/25/19 0911  . calcium carbonate (TUMS - dosed in mg elemental calcium) chewable tablet 200 mg of elemental calcium  1 tablet Oral TID PRN Angiulli, Lavon Paganini, PA-C      . carbamide peroxide (DEBROX) 6.5 % OTIC (EAR) solution 5 drop  5 drop Both EARS BID Raulkar, Clide Deutscher, MD   5 drop at 11/24/19 2134  . carvedilol (COREG) tablet 25 mg  25 mg Oral BID WC AngiulliLavon Paganini, PA-C   25 mg at 11/25/19 0910  . chlorhexidine (PERIDEX) 0.12 % solution 15 mL  15 mL Mouth Rinse BID Cathlyn Parsons, PA-C   15 mL at 11/24/19 2134  . Chlorhexidine Gluconate Cloth 2 % PADS 6 each  6 each Topical Daily Lovorn, Jinny Blossom, MD   6 each at 11/24/19 [phone]  . clonazePAM (KLONOPIN) disintegrating tablet 1 mg  1 mg Oral QHS AngiulliLavon Paganini, PA-C   1 mg at 11/24/19 2123  . diazepam (VALIUM) tablet 2.5 mg  2.5 mg Per Tube Q12H PRN Angiulli, Lavon Paganini, PA-C      . enoxaparin (LOVENOX) injection 60 mg  60 mg Subcutaneous Q24H AngiulliLavon Paganini, PA-C   60 mg at 11/25/19 1226  . escitalopram (LEXAPRO) tablet 20 mg  20 mg Oral Daily Cathlyn Parsons, PA-C   20 mg at 11/25/19 0911  . feeding supplement (ENSURE ENLIVE) (ENSURE ENLIVE) liquid 237 mL  237 mL Oral TID BM Angiulli, Daniel J, PA-C   237 mL at 11/25/19 1002  . feeding supplement (OSMOLITE 1.5 CAL) liquid 1,000 mL  1,000 mL Per Tube Q24H Courtney Heys, MD   Stopped at 11/25/19 0910  . feeding supplement (PRO-STAT SUGAR FREE 64) liquid 60 mL  60 mL Per Tube QID AngiulliLavon Paganini, PA-C   60 mL at 11/24/19 1259  . Gerhardt's butt cream   Topical Daily Cathlyn Parsons, PA-C   Given at 11/25/19 9373  . HYDROmorphone (DILAUDID) tablet 2 mg  2 mg Per Tube Q6H PRN Cathlyn Parsons, PA-C   2 mg at 11/25/19 1002  . insulin aspart (novoLOG) injection 0-15 Units  0-15 Units Subcutaneous TID AC & HS Raulkar, Clide Deutscher, MD   2 Units at 11/25/19 0617  . ipratropium-albuterol (DUONEB) 0.5-2.5 (3) MG/3ML nebulizer solution 3 mL  3 mL Nebulization Q4H PRN Angiulli,  Lavon Paganini, PA-C      . ketotifen (ZADITOR) 0.025 % ophthalmic solution 1 drop  1 drop Both Eyes BID Raulkar, Clide Deutscher, MD   1 drop at 11/24/19 2135  . loperamide HCl (IMODIUM) 1 MG/7.5ML suspension 2 mg  2 mg Per Tube PRN Cathlyn Parsons, PA-C   2 mg at 11/15/19 1304  . loratadine (CLARITIN) tablet 10 mg  10 mg Oral Daily Angiulli, Lavon Paganini, PA-C  10 mg at 11/25/19 0910  . melatonin tablet 6 mg  6 mg Oral QHS AngiulliLavon Paganini, PA-C   6 mg at 11/24/19 2123  . montelukast (SINGULAIR) tablet 10 mg  10 mg Oral QHS AngiulliLavon Paganini, PA-C   10 mg at 11/24/19 2123  . nutrition supplement (JUVEN) (JUVEN) powder packet 1 packet  1 packet Per Tube BID BM Angiulli, Lavon Paganini, PA-C   1 packet at 11/25/19 1002  . ondansetron (ZOFRAN) tablet 4 mg  4 mg Oral Q8H PRN Raulkar, Clide Deutscher, MD   4 mg at 11/23/19 1245  . ondansetron (ZOFRAN) tablet 4 mg  4 mg Oral Daily Lovorn, Megan, MD   4 mg at 11/25/19 0616  . pantoprazole (PROTONIX) EC tablet 40 mg  40 mg Oral Daily Cathlyn Parsons, PA-C   40 mg at 11/25/19 0911  . polycarbophil (FIBERCON) tablet 625 mg  625 mg Oral Daily Cathlyn Parsons, PA-C   625 mg at 11/25/19 0911  . psyllium (HYDROCIL/METAMUCIL) packet 1 packet  1 packet Oral TID Cathlyn Parsons, PA-C   1 packet at 11/25/19 0910  . saccharomyces boulardii (FLORASTOR) capsule 250 mg  250 mg Oral BID Meredith Staggers, MD   250 mg at 11/25/19 1237  . sodium chloride (OCEAN) 0.65 % nasal spray 1 spray  1 spray Each Nare PRN AngiulliLavon Paganini, PA-C   1 spray at 11/19/19 0841  . sodium chloride flush (NS) 0.9 % injection 10-40 mL  10-40 mL Intracatheter Q12H Lovorn, Megan, MD   10 mL at 11/24/19 2134  . sodium chloride flush (NS) 0.9 % injection 10-40 mL  10-40 mL Intracatheter PRN Lovorn, Megan, MD      . sorbitol 70 % solution 30 mL  30 mL Oral Daily PRN Angiulli, Lavon Paganini, PA-C      . spironolactone (ALDACTONE) tablet 25 mg  25 mg Oral Daily Cathlyn Parsons, PA-C   25 mg at 11/25/19 0910  .  tamsulosin (FLOMAX) capsule 0.4 mg  0.4 mg Oral QPC supper Cathlyn Parsons, PA-C   0.4 mg at 11/24/19 1740  . traZODone (DESYREL) tablet 100 mg  100 mg Oral QHS AngiulliLavon Paganini, PA-C   100 mg at 11/24/19 2123     Discharge Medications: Please see discharge summary for a list of discharge medications.  Relevant Imaging Results:  Relevant Lab Results:   Additional [place], MSW, [place] Office: [phone] Cell: [phone] Fax: [phone]

## 2019-11-26 ENCOUNTER — Inpatient Hospital Stay (HOSPITAL_COMMUNITY): Payer: Managed Care, Other (non HMO)

## 2019-11-26 ENCOUNTER — Inpatient Hospital Stay (HOSPITAL_COMMUNITY): Payer: Managed Care, Other (non HMO) | Admitting: Occupational Therapy

## 2019-11-26 ENCOUNTER — Inpatient Hospital Stay (HOSPITAL_COMMUNITY): Payer: Managed Care, Other (non HMO) | Admitting: Speech Pathology

## 2019-11-26 DIAGNOSIS — G7281 Critical illness myopathy: Secondary | ICD-10-CM | POA: Diagnosis not present

## 2019-11-26 LAB — GLUCOSE, CAPILLARY
Glucose-Capillary: 115 mg/dL — ABNORMAL HIGH (ref 70–99)
Glucose-Capillary: 85 mg/dL (ref 70–99)
Glucose-Capillary: 105 mg/dL — ABNORMAL HIGH (ref 70–99)
Glucose-Capillary: 104 mg/dL — ABNORMAL HIGH (ref 70–99)

## 2019-11-26 MED ORDER — DIPHENHYDRAMINE HCL 25 MG PO CAPS
25.0000 mg | ORAL_CAPSULE | Freq: Every evening | ORAL | Status: DC | PRN
  Administered 2019-11-28 – 2019-12-01 (×2): 25 mg via ORAL
  Filled 2019-11-26 (×2): qty 1

## 2019-11-26 NOTE — Progress Notes (Signed)
Speech Language Pathology Discharge Summary  Patient Details  Name: [name] MRN: [id number] Date of Birth: Aug 10, 1975  Today's Date: 11/26/2019 SLP Individual Time: 0725-0750 SLP Individual Time Calculation (min): 25 min   Skilled Therapeutic Interventions: Skilled treatment session focused on dysphagia and cognitive goals. SLP facilitated session by providing set-up assist for self-feeding. Patient consumed regular textures with thin liquids with minimal overt s/s of aspiration and was overall Mod I for swallowing compensatory strategies. Patient was also Mod I for recall of functional information in regards to d/c planning, goals of therapy, etc. Patient is at his cognitive and swallowing baseline level of functioning, therefore, he will be discharged from skilled SLP intervention. Patient left upright in bed with alarm on and all needs within reach. Continue with current plan of care.   Patient has met 3 of 3 long term goals.  Patient to discharge at overall Modified Independent level.   Reasons goals not met: N/A   Clinical Impression/Discharge Summary: Patient has made excellent gains and has met 3 of 3 LTGs this admission. Currently, patient is consuming regular textures with thin liquids with minimal overt s/s of aspiration and overall Mod I for use of swallowing compensatory strategies. Patient is also completing mildly complex verbal problem solving and recall tasks with overall Mod I. Patient is back to his baseline level of cognitive and swallowing function and all education is complete, therefore, patient will be discharged from skilled SLP intervention.  Patient verbalized understanding and agreement.  Recommendation:  None      Equipment: N/A   Reasons for discharge: Treatment goals met   Patient/Family Agrees with Progress Made and Goals Achieved: Yes    [place], [place] 11/26/2019, 10:07 AM

## 2019-11-26 NOTE — Plan of Care (Signed)
  Problem: Education: Goal: Required Educational Video(s) Outcome: Progressing   Problem: Consults Goal: RH GENERAL PATIENT EDUCATION Description: See Patient Education module for education specifics. Outcome: Progressing Goal: Skin Care Protocol Initiated - if Braden Score 18 or less Description: If consults are not indicated, leave blank or document N/A Outcome: Progressing   Problem: RH BOWEL ELIMINATION Goal: RH STG MANAGE BOWEL WITH ASSISTANCE Description: STG Manage Bowel with min Assistance. Outcome: Progressing   Problem: RH BLADDER ELIMINATION Goal: RH STG MANAGE BLADDER WITH ASSISTANCE Description: STG Manage Bladder With min Assistance Outcome: Progressing   Problem: RH SKIN INTEGRITY Goal: RH STG SKIN FREE OF INFECTION/BREAKDOWN Description: Skin will be free of infection/breakdown with mod assistance Outcome: Progressing Goal: RH STG MAINTAIN SKIN INTEGRITY WITH ASSISTANCE Description: STG Maintain Skin Integrity With mod Assistance. Outcome: Progressing Goal: RH STG ABLE TO PERFORM INCISION/WOUND CARE W/ASSISTANCE Description: STG Able To Perform Incision/Wound Care With total Assistance. Outcome: Progressing   Problem: RH SAFETY Goal: RH STG ADHERE TO SAFETY PRECAUTIONS W/ASSISTANCE/DEVICE Description: STG Adhere to Safety Precautions With min Assistance/Device. Outcome: Progressing Goal: RH STG DECREASED RISK OF FALL WITH ASSISTANCE Description: STG Decreased Risk of Fall With max Assistance. Outcome: Progressing   Problem: RH PAIN MANAGEMENT Goal: RH STG PAIN MANAGED AT OR BELOW PT'S PAIN GOAL Description: Pain will be less than 3 out of 10 on pain scale. Pt will be able to verbalize when in pain  Outcome: Progressing

## 2019-11-26 NOTE — Progress Notes (Signed)
[place] PHYSICAL MEDICINE & REHABILITATION PROGRESS NOTE   Subjective/Complaints:  Pt not taking metamucil or prostat- makes him nauseated- nausea better this AM with anti-nausea- also not hungry still.   Per nursing, pt refusing to "help hold urinal or help turn"-   Pt frustrated because had BM in middle of night, then couldn't go back to bed.  Discussed bowel program OR benadryl prn- he wants benadryl.     ROS:  Pt denies SOB, abd pain, CP, N/V/C/D, and vision changes   Objective:   No results found. Recent Labs    11/25/19 0411  WBC 7.7  HGB 12.1*  HCT 38.9*  PLT 182   Recent Labs    11/25/19 0411  NA 136  K 3.9  CL 96*  CO2 28  GLUCOSE 99  BUN 40*  CREATININE 0.96  CALCIUM 9.7    Intake/Output Summary (Last 24 hours) at 11/26/2019 1032 Last data filed at 11/26/2019 1003 Gross per 24 hour  Intake 800 ml  Output 2150 ml  Net -1350 ml     Physical Exam: Vital Signs Blood pressure 110/60, pulse 87, temperature 98.4 F (36.9 C), temperature source Oral, resp. rate 20, weight 120.3 kg, SpO2 94 %.  Constitutional: No distress . Vital signs reviewed. Laying in bed OFF O2, supine, NAD HEENT: conjugate gaze Neck: supple Cardiovascular: RRR- no JVD   Respiratory/Chest: CTA B/L- no W/R/R- good air movement GI/Abdomen:Soft, NT, ND, (+)BS  Ext: no clubbing, cyanosis, or edema Psych: flat affect- cordial Musculoskeletal:     Cervical back: Normal range of motion and neck supple.     Comments: RUE- delt- 2/5, bicep 2/5, tricep 3-/5, WE 0/5, grip 3-/5, finger 0/5  LUE- deltoid 2/5, biceps 2 to 2-/5, triceps 4-/5, E 3/5, grip 3/5, finger 1/5 LEs B/L- HF 2-/5, KE and KF 2/5, DF 1/5; PF 2/5 Neurological: Alert & oriented x 3. CN exam intact. Reasonable insight and awareness. -  Pt reports sensation decreased in L thumb and RUE; normal in LEs to light touch No hoffman's or clonus B/L  Skin:  Abrasions dressed/exposed, stable  Bottom of feet have a thick  black eschar 1/3 of it peeled off- ~ 2-53m thick  half of feet B/L-reassessed- looks the same   Assessment/Plan: 1. 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  Physiatrist is providing close team supervision and 24 hour management of active medical problems listed below.  Physiatrist and rehab team continue to assess barriers to discharge/monitor patient progress toward functional and medical goals  Care Tool:  Bathing    Body parts bathed by patient: Abdomen   Body parts bathed by helper: Right arm, Left arm, Chest, Front perineal area, Buttocks, Right upper leg, Left upper leg, Right lower leg, Left lower leg, Face     Bathing assist Assist Level: Total Assistance - Patient < 25%     Upper Body Dressing/Undressing Upper body dressing   What is the patient wearing?: Hospital gown only    Upper body assist Assist Level: Total Assistance - Patient < 25%    Lower Body Dressing/Undressing Lower body dressing      What is the patient wearing?: Hospital gown only     Lower body assist Assist for lower body dressing: Total Assistance - Patient < 25%     Toileting Toileting    Toileting assist Assist for toileting: Total Assistance - Patient < 25%  Transfers Chair/bed transfer  Transfers assist     Chair/bed transfer assist level: 2 Helpers     Locomotion Ambulation   Ambulation assist   Ambulation activity did not occur: Safety/medical concerns          Walk 10 feet activity   Assist  Walk 10 feet activity did not occur: Safety/medical concerns        Walk 50 feet activity   Assist Walk 50 feet with 2 turns activity did not occur: Safety/medical concerns         Walk 150 feet activity   Assist Walk 150 feet activity did not occur: Safety/medical concerns         Walk 10 feet on uneven surface  activity   Assist Walk 10  feet on uneven surfaces activity did not occur: Safety/medical concerns         Wheelchair     Assist Will patient use wheelchair at discharge?: Yes Type of Wheelchair: Manual    Wheelchair assist level: Minimal Assistance - Patient > 75% Max wheelchair distance: 50(w/mult rest breaks)    Wheelchair 50 feet with 2 turns activity    Assist    Wheelchair 50 feet with 2 turns activity did not occur: Safety/medical concerns(unable to turn wc)       Wheelchair 150 feet activity     Assist  Wheelchair 150 feet activity did not occur: Safety/medical concerns       Blood pressure 110/60, pulse 87, temperature 98.4 F (36.9 C), temperature source Oral, resp. rate 20, weight 120.3 kg, SpO2 94 %.  Medical Problem List and Plan: 1.  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              -ELOS/Goals: 3-4 weeks- goals max assist- severely impaired  -Continue CIR PT, OT, SLP 2.  Antithrombotics: -DVT/anticoagulation: Lovenox 60 mg daily.  Vascular study negative             -antiplatelet therapy: N/A 3. Pain Management: Dilaudid 2 mg every 6 hours as needed pain 4. Mood: Melatonin 6 mg nightly, trazodone 100 mg nightly, Klonopin 1 mg nightly, Lexapro 20 mg daily, Valium 2.5 mg every 12 hours as needed anxiety  4/9- will try to get Neuropsych for pt due to anxiety.   4/10: Did not use valium today  4/12- neuropsych on schedule per SW  4/16- saw Neuropsych today- discussed SNF planning.              -antipsychotic agents: N/A 5. Neuropsych: This patient is  capable of making decisions on his own behalf. 6. Skin/Wound/ Care/deep tissue pressure injury bilateral heels and lateral feet: Wound care as directed  4/11: Ordered betadine dressings for bilateral feet as he was receiving in acute care.   4/14- consulted [facility]: they asked for debridement- consulted Dr Sharol Given.  4/16- haven't been able to tell if Dr Sharol Given came ot see pt- no note- will recheck Monday.  4/18- will call Dr Sharol Given Monday to  see if can assess- didn't ever see note.      4/19 Dr Sharol Given has seen patient. consvt mgt--->outpt f/u  4/20- will let [facility] know Dr Sharol Given wants to see outpatient.  7. Fluids/Electrolytes/Nutrition: Strict INO's with follow-up chemistries  4/9- labs look good except BUN still 46- is dry? But is stable/better than was 1 week ago- was 51.  4/12- will recheck labs in AM   4/13- BUN down to 40- doing better- con't to monitor weekly.  4/19. I personally reviewed the patient's labs today.   Labs stable, BUN still 40   -discussed nutrition with patient today, encourage PO fluids 8.  Dysphagia.  Status post PEG tube 09/11/2019 per Dr. Worthy Keeler.  Diet has been advanced to regular.  Patient is still receiving nocturnal tube feeds by PEG tube.  4/9- doing calorie count, to see if can stop TFs  4/13- cannot stop TFs per dietician, since pt has high protein/calorie needs to aid healing, and is Dependent on staff to feed him everything.   4/20- asking dietician to move TFs to finish earlier so hungry for breakfast.  9.  Status post tracheostomy 08/06/2019.  Decannulated 11/08/2019. 10.  Hypertension.  Norvasc 10 mg daily, Coreg 25 mg twice daily, Aldactone 25 mg daily.  Monitor with increased mobility  4/9-4/10 BP well controlled- continue regimen. Addendum: Hypotensive to 106/49 at night; decreased amlodipine dose to 40m.   4/17- BP 99/52 this AM- will decrease norvasc to 2.5 mg daily.  4/19 BP a little better today 119/62--no changes  4/20- BP 110/60- doing better- con't meds 11.   History of asthma.  Continue nebulizers as directed  4/15- will con't to wean O2- is at 1L currently  4/16- wrote to wean O2 order and told nursing again  4/17- sats 99%- wearing 1L O2- will d/w nursing again- I think it can come off, unless nursing can show he needs it- his sats are 95-99% on 1L. Wrote order to wean, again.  4/18- O2 dropped to 87% today after tolerating therapy without O2- will use prn.    4/19 weaning oxygen as possible. 1.5L this AM. Encouraged deep breathing OOB  4/20- off O2 this AM- sats 94% 12.  GERD.  Protonix 13.  BPH.  Flomax 0.4 mg daily. 14. Morbid obesity- current BMI of 40- will discuss diet, etc once more appropriate.  4/15- BMI down to 38.8- improving  15. Insomnia- con't meds as above  4/9-4/10 sleeps fine- is his normal amount of sleep  4/12- doesn't sleep well, but is chronic- on Klonopin AND trazodone 100 mg QHS  4/20- added benadryl to QHS prn 25 mg so if wakes up at night, can have benadryl 16. Right ear wax: Debrox ear drops  4/20- stopped drops 17. Nausea/loose stool  4/18- will schedule Zofran at 0600 am to help with AM nausea.  4/19 already on fiber. Will add florastor as well to improve stool consistency   LOS: 12 days A FACE TO FACE EVALUATION WAS PERFORMED  Brigette Hopfer 11/26/2019, 10:32 AM

## 2019-11-26 NOTE — Progress Notes (Addendum)
Pt observed with eyes closed off and on during the night. Pt constantly calls for assist. Pt refuses to take prostat, and metamurcil. Pt refusing to try and help with turning in bed or helping hold the urinal. Pt unwilling to try to help himself. This Probation officer has encouraged pt to try and be more sufficient so that he could go home, put uninterested in trying.

## 2019-11-26 NOTE — Patient Care Conference (Signed)
Inpatient RehabilitationTeam Conference and Plan of Care Update Date: 11/26/2019   Time: 11:35 AM   Patient Name: [name]      Medical Record Number: [id number]  Date of Birth: [birth year] Sex: Male         Room/Bed: 4W11C/4W11C-01 Payor Info: Payor: [employer] / Plan: CIGNA MANAGED / Product Type: *No Product type* /    Admit Date/Time:  11/14/2019  2:11 PM  Primary Diagnosis:  Critical illness myopathy  Patient Active Problem List   Diagnosis Date Noted  . Eschar of foot   . 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  . History of COVID-19   . ARDS (adult respiratory distress syndrome) ([place])   . Pressure injury of skin 08/22/2019  . Need for emotional support   . Chest tube in place   . Personal history of ECMO   . Advanced care planning/counseling discussion   . Acute respiratory distress syndrome (ARDS) due to COVID-19 virus ([place])   . Palliative care by specialist   . Goals of care, counseling/discussion   . Advanced directives, counseling/discussion   . Subcutaneous crepitus   . Pneumonia due to COVID-19 virus 07/23/2019  . Acute respiratory failure ([place]) 07/22/2019  . Persistent asthma with undetermined severity   . Thrombocytopenia ([place])   . Leukopenia   . Fever   . Gram positive bacterial infection   . Septic arthritis of right acromioclavicular joint ([place]) 09/29/2017  . Chronic left-sided low back pain with left-sided sciatica 09/19/2017  . Epigastric pain   . Pancreatitis 04/17/2017  . Essential hypertension 04/17/2017  . Moderate persistent asthma 07/21/2015  . Allergic rhinoconjunctivitis 07/21/2015  . GERD (gastroesophageal reflux disease) 07/21/2015  . ABNORMALITY OF GAIT 11/12/2009  . TARSAL TUNNEL SYNDROME, LEFT 10/08/2009  . PES PLANUS 10/08/2009    Expected Discharge Date: Expected Discharge Date: (Anticipate need for SNF placement at DC)  Team Members Present: Physician leading conference: Dr. Courtney Heys Care Coodinator Present: Loralee Pacas,  LCSWA;Christina Sampson Goon, BSW;Genie Quantrell Splitt, RN, MSN Nurse Present: Rayne Du, LPN PT Present: Apolinar Junes, PT OT Present: Elisabeth Most, OT SLP Present: Weston Anna, SLP PPS Coordinator present : Ileana Ladd, Burna Mortimer, SLP     Current Status/Progress Goal Weekly Team Focus  Bowel/Bladder   pt is cont of bladder and stool uses urinal and bedpain  maintain continence  assess tolieting needs qshift/prn   Swallow/Nutrition/ Hydration   Regular textures with thin liquids, Mod I  Mod I  Goal Met, D/C from SLP   ADL's   rolling in bed mod/max A, SB transfer max A +2, UB bathing/dressing mod A, LB bathing/dressing max A, eating set up/min A  min a  bed mobility, trunk control, seated balance, UB ROM/strength, HEP, general conditioning, ADL training, Assistive devices   Mobility   Max-mod A bed mobility, max-mod A +2 SB transfers, min A w/c 10 ft  Supervision-min assist  Transfers, bed mobility, global strength, ankle and knee ROM, wc propulsion, endurance, vertical tolerance (tilt feature on Kreg bed-watch OH), sitting tolerance   Communication             Safety/Cognition/ Behavioral Observations  Mod I  Mod I  Goal Met, D/C from SLP   Pain   pain to back and buttocks and knees, prn medication  keep pain level less than 3  assess pain qshift and prn , adminster prn when needed   Skin   no visible skin impairments  maintain skin intergrity free of breakdown  assess pain qshift and  prn    Rehab Goals Patient on target to meet rehab goals: Yes *See Care Plan and progress notes for long and short-term goals.     Barriers to Discharge  Current Status/Progress Possible Resolutions Date Resolved   Nursing                  PT  Home environment access/layout;Decreased caregiver support;Lack of/limited family support;Medical stability                 OT                  SLP                SW Decreased caregiver support;Lack of/limited family support Family  unable to provider 24/7 care            Discharge Planning/Teaching Needs:  Pt will d/c to SNF  Family education as recommended by therapy   Team Discussion: MD on TFs, not hungry in am, sleep issues, meds adjusted, Dr. Sharol Given f/u out patient.  RN uses bedpan, cont B/B.  OT min A rolling, mod sit, transfers max +2 SB, feeding set up.  PT tolerated 1 hr + sitting, transfers mod/max +2.  SLP reg/thins, cognition cleared, will DC today from SLP.  Will stop 15/7 and increase to full therapy schedule.  Will need SNF placement at time of DC.   Revisions to Treatment Plan: N/A     Medical Summary Current Status: bed pan for continence; Weekly Focus/Goal: OT_ doing better- severe weakness- min assist roll in bed; mod of 1 supine to sit; transfers of 2 people- can feed self now; R WE new  Barriers to Discharge: Behavior;Decreased family/caregiver support;Home enviroment access/layout;Weight bearing restrictions;Weight;Wound care;Nutrition means;Medical stability;Medication compliance  Barriers to Discharge Comments: SL_ regular diet- cognition cleared; d/c'd this AM Possible Resolutions to Barriers: PT- same issues as above; >1 hr sitting tolerance; transfers biggest barrier;   Continued Need for Acute Rehabilitation Level of Care: The patient requires daily medical management by a physician with specialized training in physical medicine and rehabilitation for the following reasons: Direction of a multidisciplinary physical rehabilitation program to maximize functional independence : Yes Medical management of patient stability for increased activity during participation in an intensive rehabilitation regime.: Yes Analysis of laboratory values and/or radiology reports with any subsequent need for medication adjustment and/or medical intervention. : Yes   I attest that I was present, lead the team conference, and concur with the assessment and plan of the team.   Jodell Cipro M 11/26/2019, 5:52 PM    Team conference was held via web/ teleconference due to [place] - 19

## 2019-11-26 NOTE — Progress Notes (Signed)
Occupational Therapy Session Note  Patient Details  Name: [name] MRN: [id number] Date of Birth: [birth year]  Today's Date: 11/26/2019 OT Individual Time: 2194-7125 OT Individual Time Calculation (min): 60 min    Short Term Goals: Week 2:  OT Short Term Goal 1 (Week 2): patient will complete supine to sitting position with mod A of one OT Short Term Goal 2 (Week 2): patient will complete rolling in bed with min A OT Short Term Goal 3 (Week 2): patient will complete UB dressing with min A OT Short Term Goal 4 (Week 2): patient will complete grooming and eating tasks with min a  Skilled Therapeutic Interventions/Progress Updates:    Patient in bed, states that he did not sleep well last night but pain is controlled.  Completed LB bathing and dressing bed level with max A.  He is able to reach abdomin and groin for washing.  Rolling in bed min A.  Side lying to sitting mod A of one.  He tolerated unsupported sitting edge of bed for 10 minutes with CS during which time he completed UB bathing and dressing - overall max A due to limited reach in unsupported position.  SB transfer bed to TIS w/c max A of 2.  Oral care completed w/c level mod A.  Hair care (shampoo cap and combing) dependent.  Max A for repositioning in w/c, he remained in reclined position w/c level at end of session with call bell and tray table in reach.    Therapy Documentation Precautions:  Precautions Precautions: Fall Precaution Comments: bilat wounds/feet, PEG Required Braces or Orthoses: Other Brace Other Brace: B prevalon boots; R wrist cock-up splint Restrictions Weight Bearing Restrictions: No RLE Weight Bearing: Non weight bearing LLE Weight Bearing: Non weight bearing Other Position/Activity Restrictions: to be clarified due to bilat wounds plantar and lateral aspect of feet General:   Vital Signs: Therapy Vitals Temp: 98.4 F (36.9 C) Temp Source: Oral Pulse Rate: 87 Resp: 20 BP: 110/60 Patient  Position (if appropriate): Lying Oxygen Therapy SpO2: 94 % O2 Device: Room Air Other Treatments:     Therapy/Group: Individual Therapy  Carlos Levering 11/26/2019, 7:36 AM

## 2019-11-26 NOTE — Progress Notes (Signed)
Physical Therapy Session Note  Patient Details  Name: [name] MRN: [id number] Date of Birth: 28-Sep-1975  Today's Date: 11/26/2019 PT Individual Time: 1351-1455 PT Individual Time Calculation (min): 64 min   Short Term Goals: Week 2:  PT Short Term Goal 1 (Week 2): Pt will transition supine to sit using bed features w/min assist of 1 PT Short Term Goal 2 (Week 2): Pt will perform sit to lean on elbow to sit for placement of SB w/cga only PT Short Term Goal 3 (Week 2): bed to/from wc w/mod assist of 2 PT Short Term Goal 4 (Week 2): Pt wil tolerate 5 min continuous cardiovascular activity  Skilled Therapeutic Interventions/Progress Updates:     Patient in bed with his wife at bedside upon PT arrival. Patient alert and agreeable to PT session. Patient reported 5/10 pain at planter surface of B feet and lateral R foot during session, RN made aware. PT provided repositioning, rest breaks, and distraction as pain interventions throughout session. Assessed R foot wound due to increased lateral foot pain from leg rest when sitting in TIS w/c. Noted no sings of infection, no drainage, with healthy pink tissue surrounding black eschar. Will have patient wear Prevalon boots when sitting in TIS w/c for increased protection and comfort.   Therapeutic Activity: Bed Mobility: Patient performed rolling R/L with min A of 1 person with use of bed rails to have bed pan placed and perform LB dressing max-total A. Provided verbal cues and manual facilitation for bending opposite LE to push to roll to each side.  Utilized tilt feature of Kreg bed for increased vertical tolerance and LE weight bearing: 0 degrees: BP 124/53, HR 77, SPO2 90% on RA (asymptomatic) 15 degrees: BP 125/97, HR 92, SPO2 94% on RA (asymptomatic x5 min) 25 degrees: asymptomatic x5 min, performed quat sets and glut sets 2x10 with 5 sec hold 35 degrees: asymptomatic x5 min, performed mini squats x10 40 degrees: symptomatic after 2 min  BP 107/73, HR 85, SPO2 93% on RA  Returned patient to semi-fowler position in the bed and patient reported symptoms resolved, but stated that he had bowl and bladder urgency. Placed patient on bed pan, as above, and placed urinal. Patient was continent of bladder with total A for urinal placement and left on the bed pan for BM at end of session. RN made aware.  Patient in bed on the bed pan with his wife in the room at end of session with breaks locked, bed alarm set, and all needs within reach.    Therapy Documentation Precautions:  Precautions Precautions: Fall Precaution Comments: bilat wounds/feet, PEG Required Braces or Orthoses: Other Brace Other Brace: B prevalon boots; R wrist cock-up splint Restrictions Weight Bearing Restrictions: No RLE Weight Bearing: Non weight bearing LLE Weight Bearing: Non weight bearing Other Position/Activity Restrictions: to be clarified due to bilat wounds plantar and lateral aspect of feet    Therapy/Group: Individual Therapy  Karis Rilling L Carmelita Amparo PT, DPT  11/26/2019, 5:26 PM

## 2019-11-27 ENCOUNTER — Inpatient Hospital Stay (HOSPITAL_COMMUNITY): Payer: Managed Care, Other (non HMO)

## 2019-11-27 LAB — GLUCOSE, CAPILLARY
Glucose-Capillary: 138 mg/dL — ABNORMAL HIGH (ref 70–99)
Glucose-Capillary: 79 mg/dL (ref 70–99)
Glucose-Capillary: 97 mg/dL (ref 70–99)
Glucose-Capillary: 117 mg/dL — ABNORMAL HIGH (ref 70–99)

## 2019-11-27 IMAGING — DX DG KNEE COMPLETE 4+V*L*
4 series · 4 of 4 positions shown · non-contrast
Comparison: Concurrently obtained radiographs of the pelvis and
left hip

CLINICAL DATA: 41-year-old male with intermittent left lower
extremity pain from the hip to the knee for the past year

EXAM:
LEFT KNEE - COMPLETE 4+ VIEW

[knee ap]
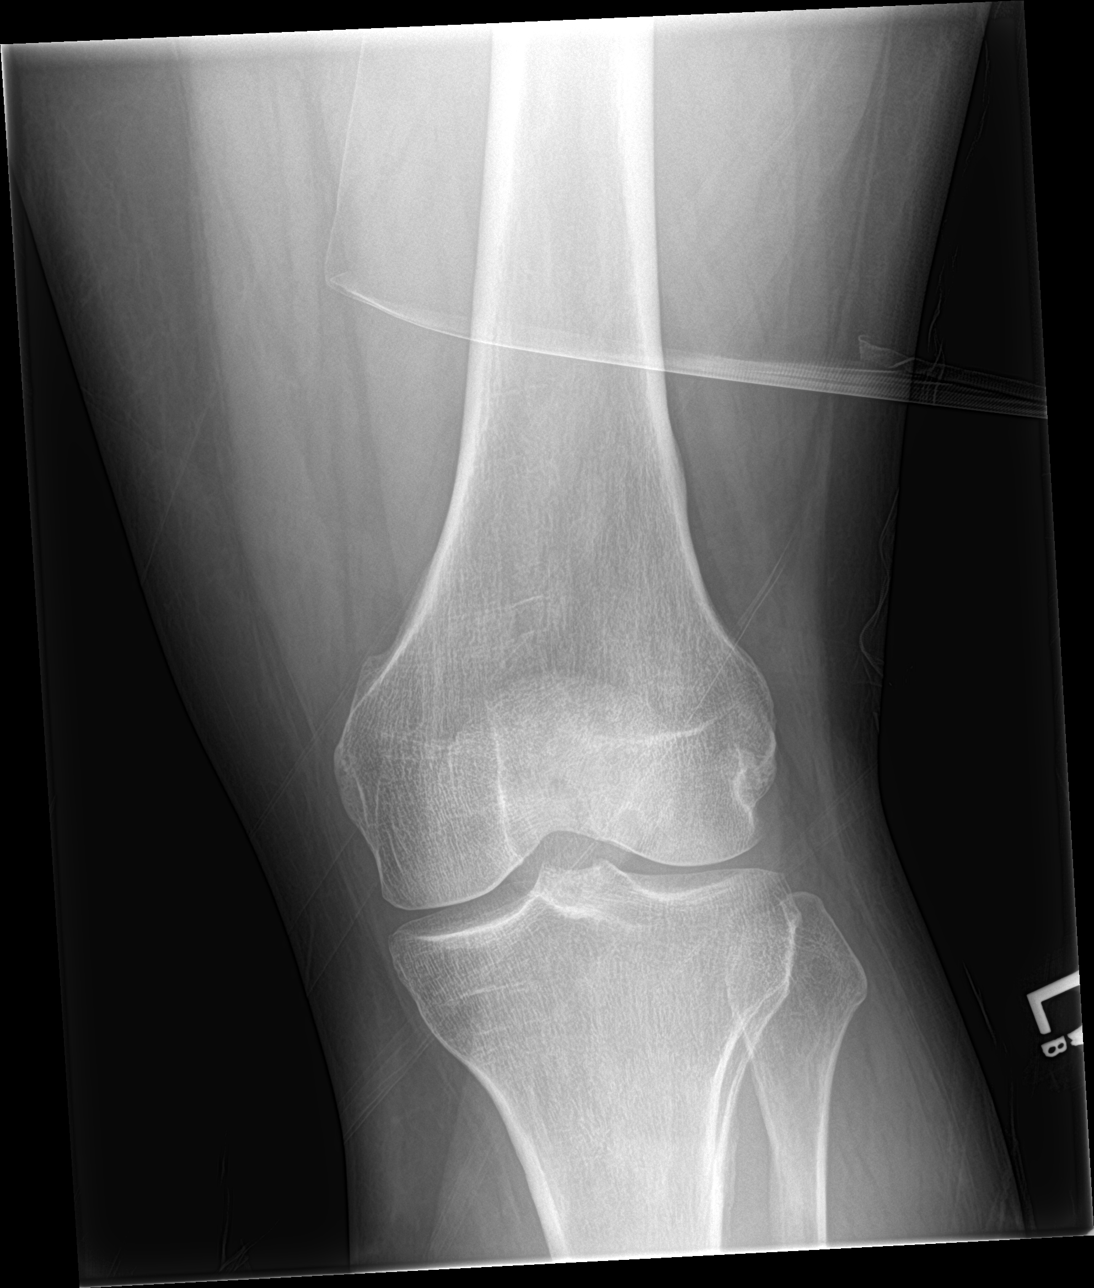

[knee lat]
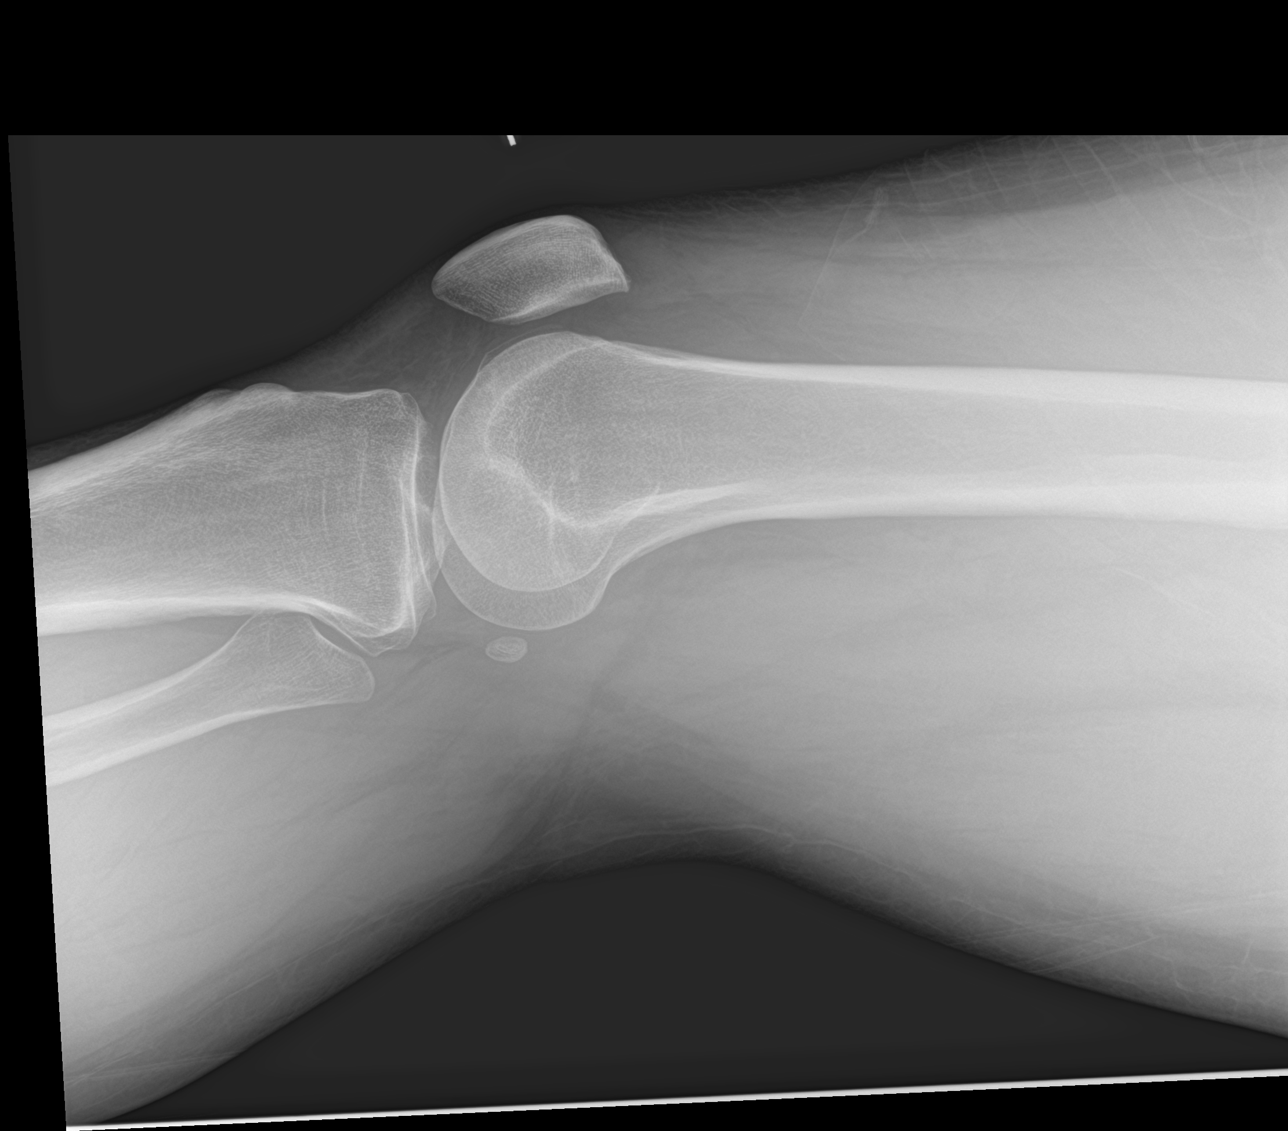

[knee obl (1 of 2)]
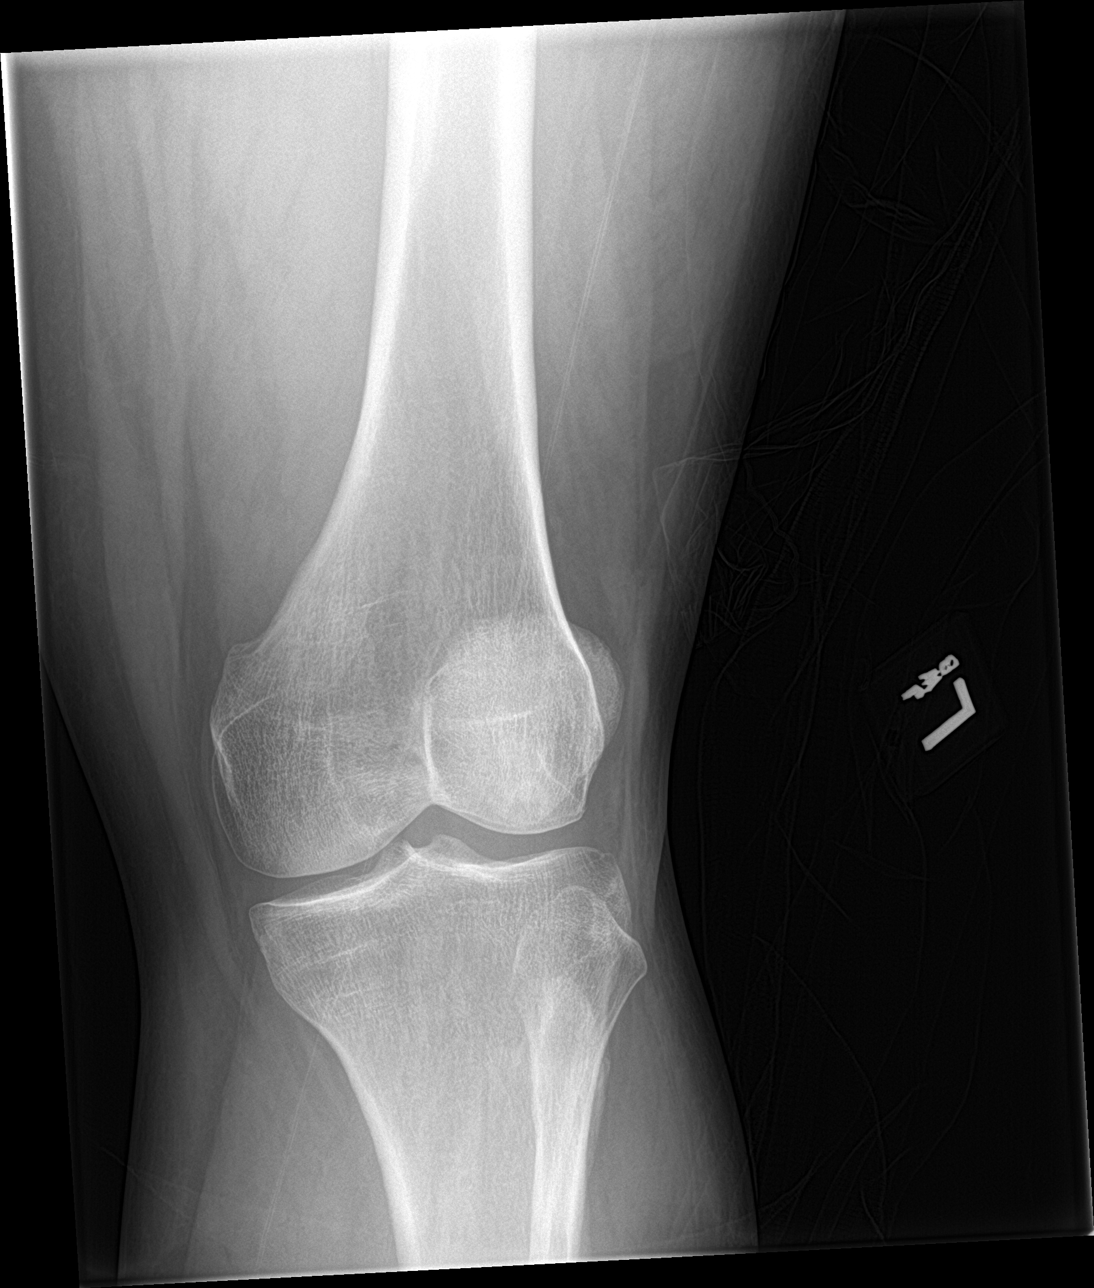

[knee obl (2 of 2)]
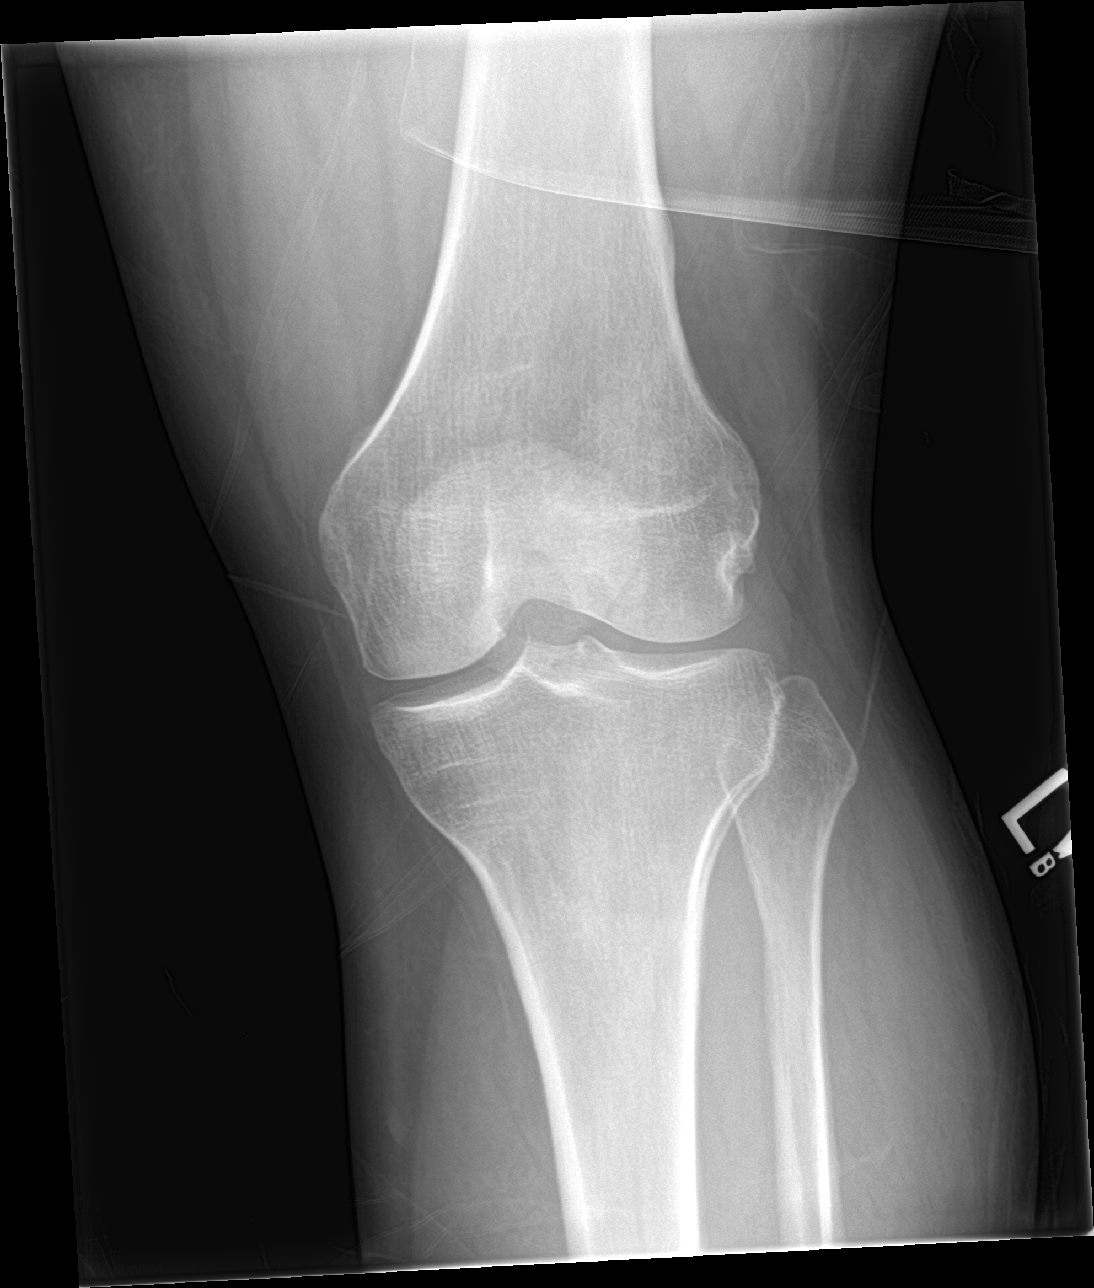

[4 of 4 positions shown; findings below may reference images not displayed]

FINDINGS: No evidence of fracture, dislocation, or joint effusion. No evidence
of arthropathy or other focal bone abnormality. Soft tissues are
unremarkable.
IMPRESSION: Negative.

## 2019-11-27 IMAGING — DX DG HIP (WITH OR WITHOUT PELVIS) 2-3V*L*
3 series · 3 of 3 positions shown · non-contrast
Comparison: Concurrently obtained radiographs of the left knee

CLINICAL DATA: 41-year-old male with left lower extremity pain from
the hip to the knee for the past year

EXAM:
DG HIP (WITH OR WITHOUT PELVIS) 2-3V LEFT

[pelvis ap]
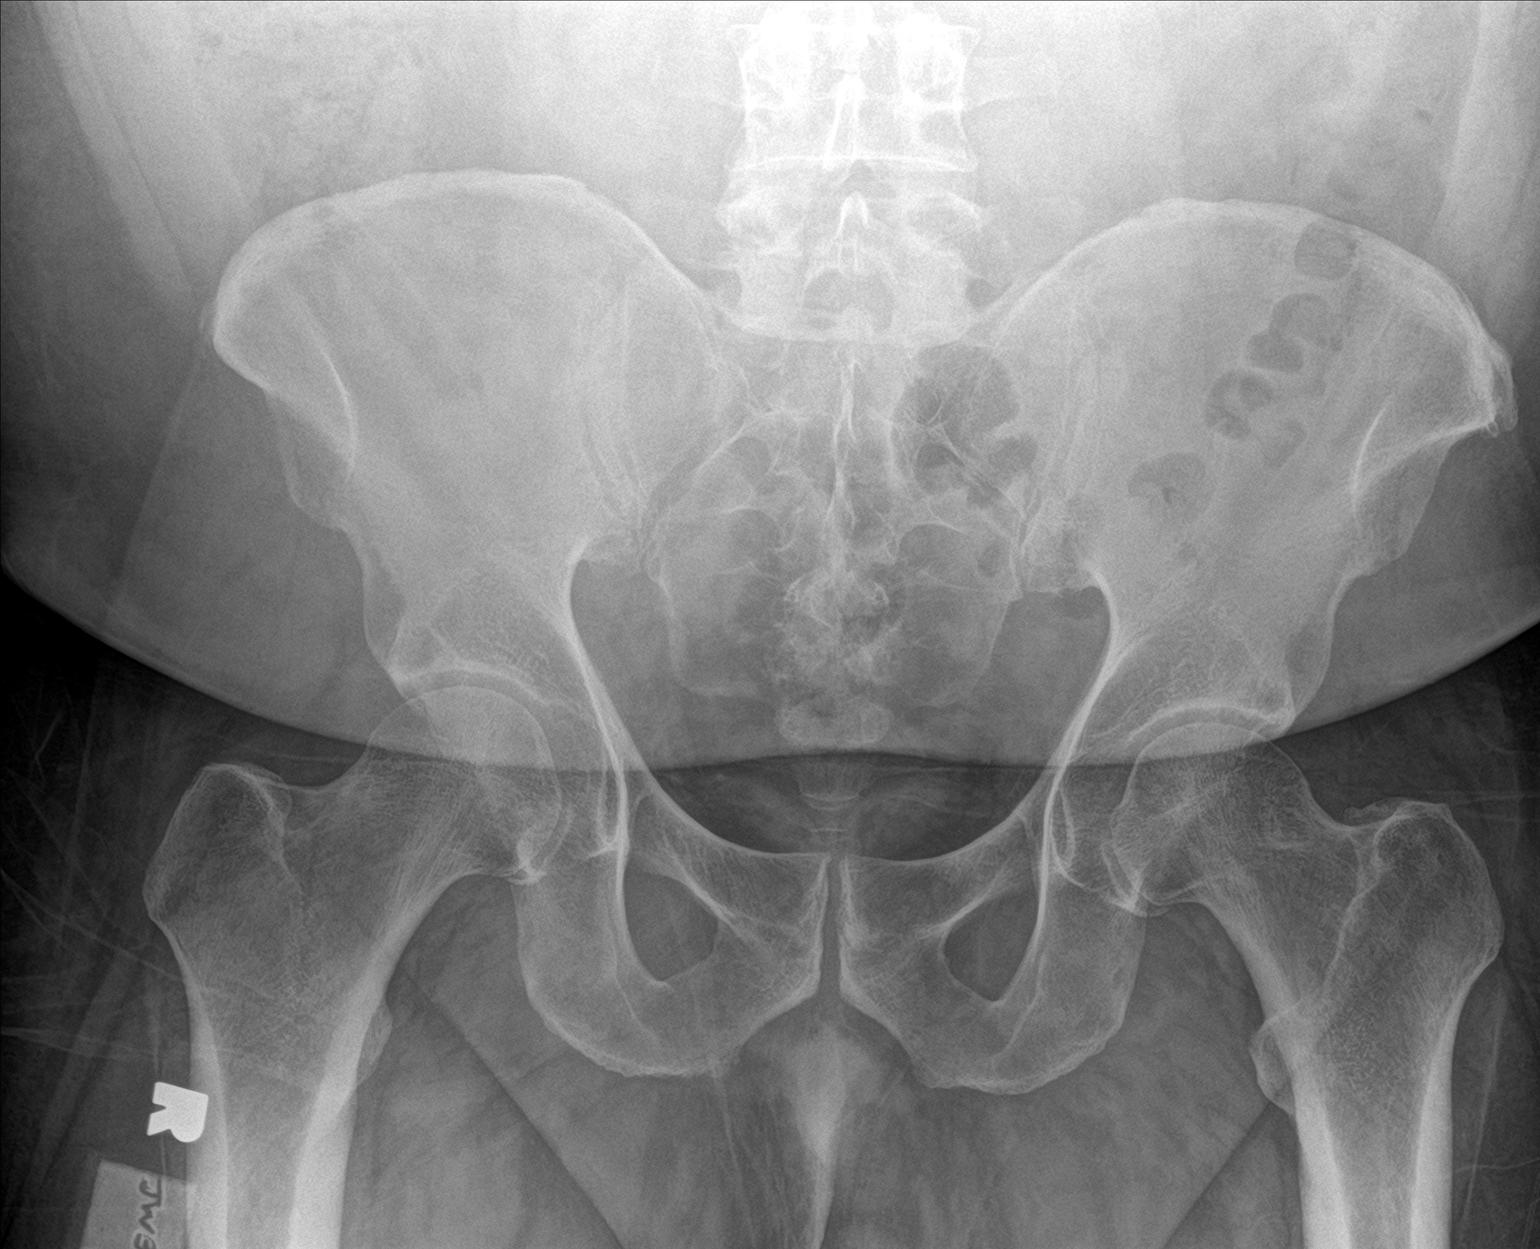

[hip ap]
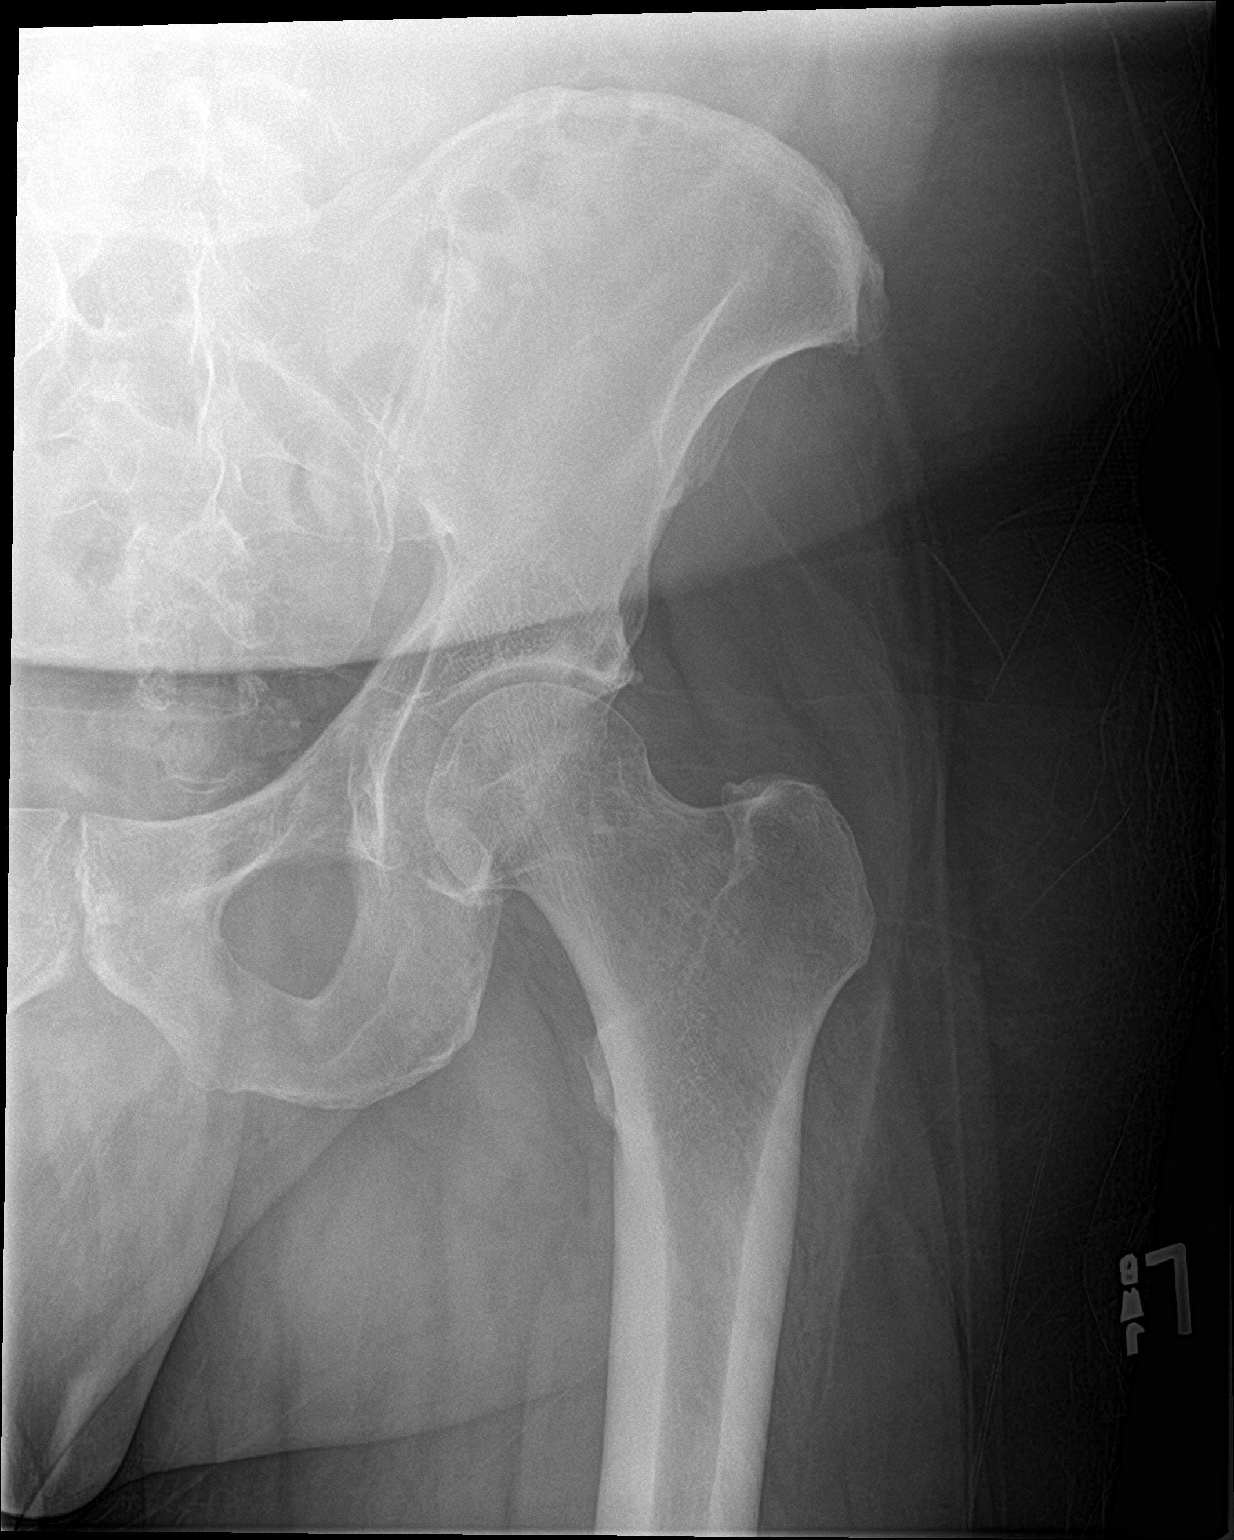

[hip lat]
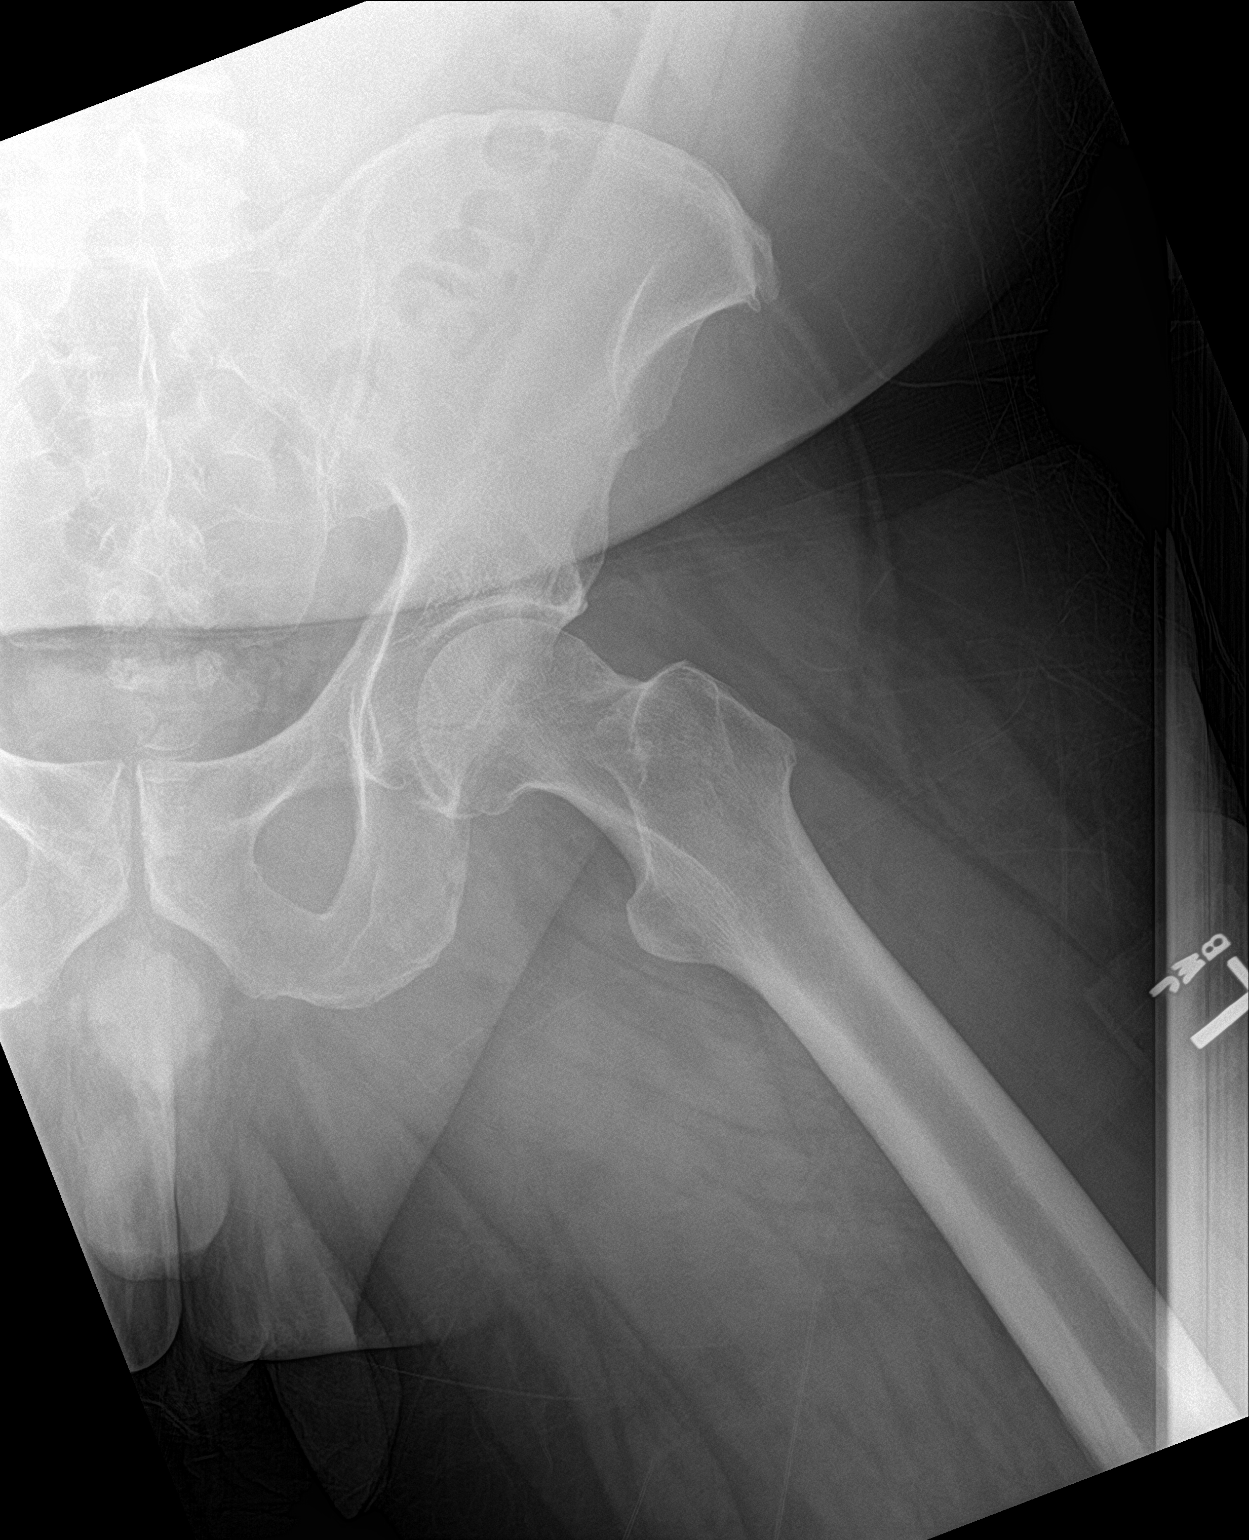

[3 of 3 positions shown; findings below may reference images not displayed]

FINDINGS: There is no evidence of hip fracture or dislocation. There is no
evidence of arthropathy or other focal bone abnormality.
IMPRESSION: Negative.

## 2019-11-27 MED ORDER — OSMOLITE 1.5 CAL PO LIQD
1000.0000 mL | ORAL | Status: DC
  Administered 2019-11-27 – 2019-12-03 (×8): 1000 mL
  Filled 2019-11-27 (×8): qty 1000

## 2019-11-27 MED ORDER — ENSURE ENLIVE PO LIQD
237.0000 mL | Freq: Four times a day (QID) | ORAL | Status: DC
  Administered 2019-11-28 – 2019-12-04 (×25): 237 mL via ORAL

## 2019-11-27 NOTE — Progress Notes (Signed)
Occupational Therapy Session Note  Patient Details  Name: [name] MRN: [id number] Date of Birth: 02/23/1976  Today's Date: 11/27/2019 OT Individual Time: 1000-1100 OT Individual Time Calculation (min): 60 min    Short Term Goals: Week 1:  OT Short Term Goal 1 (Week 1): Pt will transfer to w/c in prep for [facility] transfer wiht MAX A +2 in prep for toileting OT Short Term Goal 1 - Progress (Week 1): Progressing toward goal OT Short Term Goal 2 (Week 1): Pt will roll in B directions with MAX A of 1 caregiver to decrease BOC during LB dressing OT Short Term Goal 2 - Progress (Week 1): Met OT Short Term Goal 3 (Week 1): seated upright, pt will bring L hand to mouth in prep for self feeding/grooming OT Short Term Goal 3 - Progress (Week 1): Met OT Short Term Goal 4 (Week 1): Pt will thread 1UE into shirt with S OT Short Term Goal 4 - Progress (Week 1): Met  Skilled Therapeutic Interventions/Progress Updates:    1:1. Pt received in TIS agreeable to OT. Pt with pain in bottom of L foot and buttocks with reposiitoning for comfort provided PRN. Pt completes lateral scoot transfers with MAX A +2 and board under feet to raise height of ground and VC for anterior weight shift TIS<>EOM/EOB. Pt completes 2x5 lateral leans onto elbow in B directions and modified sit ups from large foam wedge with VC for head tuck and breathing techniques. Pt completes seated towel glides with 4# weights on wrists for gravity eliminated shoulder strengthening with VC for decreasing compensatory strategies.pt completes 3x30 sec trials of each exercise for strengthening and activity tolerance improvement Exited session with pt seated in bed, exi talarm on and call light in reach  Therapy Documentation Precautions:  Precautions Precautions: Fall Precaution Comments: bilat wounds/feet, PEG Required Braces or Orthoses: Other Brace Other Brace: B prevalon boots; R wrist cock-up splint Restrictions Weight Bearing  Restrictions: No RLE Weight Bearing: Non weight bearing LLE Weight Bearing: Non weight bearing Other Position/Activity Restrictions: to be clarified due to bilat wounds plantar and lateral aspect of feet General:   Vital Signs:  Pain: Pain Assessment Pain Scale: 0-10 Pain Score: 0-No pain ADL: ADL Eating: Dependent Grooming: Dependent Upper Body Bathing: Maximal assistance(MAX HOH A) Where Assessed-Upper Body Bathing: Edge of bed Lower Body Bathing: Dependent Where Assessed-Lower Body Bathing: Bed level Upper Body Dressing: Maximal assistance Where Assessed-Upper Body Dressing: Edge of bed Lower Body Dressing: Dependent(+2 to roll) Where Assessed-Lower Body Dressing: Bed level Toileting: Not assessed Toilet Transfer: Not assessed Vision   Perception    Praxis   Exercises:   Other Treatments:     Therapy/Group: Individual Therapy  Tonny Branch 11/27/2019, 10:03 AM

## 2019-11-27 NOTE — Progress Notes (Signed)
[facility] PHYSICAL MEDICINE & REHABILITATION PROGRESS NOTE   Subjective/Complaints:  Pt reports had BM in middle of night again- upset that "had to wake up".  Explained he has to ask for benadryl to get back to sleep.   Also still not hungry- TFs STILL running at 8:20 am this AM- order originally was 6pm for 12 hrs, but somehow got changed.     ROS:   Pt denies SOB, abd pain, CP, N/V/C/D, and vision changes   Objective:   No results found. Recent Labs    11/25/19 0411  WBC 7.7  HGB 12.1*  HCT 38.9*  PLT 182   Recent Labs    11/25/19 0411  NA 136  K 3.9  CL 96*  CO2 28  GLUCOSE 99  BUN 40*  CREATININE 0.96  CALCIUM 9.7    Intake/Output Summary (Last 24 hours) at 11/27/2019 0844 Last data filed at 11/27/2019 0253 Gross per 24 hour  Intake 840 ml  Output 1600 ml  Net -760 ml     Physical Exam: Vital Signs Blood pressure 117/66, pulse 80, temperature 98.8 F (37.1 C), resp. rate 20, weight 120.3 kg, SpO2 93 %.  Constitutional: No distress . Vital signs reviewed. Laying in bed; supine; appropriate, NAD HEENT: conjugate gaze Neck: supple Cardiovascular: RRR_ no JVD   Respiratory/Chest: CTA b/l GI/Abdomen:Soft, NT, ND, (+)BS  Ext: no clubbing, cyanosis, or edema Psych: flat affect Musculoskeletal:     Cervical back: Normal range of motion and neck supple.     Comments: RUE- delt- 2/5, bicep 2/5, tricep 3-/5, WE 0/5, grip 3-/5, finger 0/5  LUE- deltoid 2/5, biceps 2 to 2-/5, triceps 4-/5, E 3/5, grip 3/5, finger 1/5 LEs B/L- HF 2-/5, KE and KF 2/5, DF 1/5; PF 2/5 Neurological: Alert & oriented x 3. CN exam intact. Reasonable insight and awareness. -  Pt reports sensation decreased in L thumb and RUE; normal in LEs to light touch No hoffman's or clonus B/L  Skin:  Abrasions dressed/exposed, stable  Bottom of feet have a thick black eschar 1/3 of it peeled off- ~ 2-69m thick  half of feet B/L-reassessed- no change at all.    Assessment/Plan: 1.  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  Physiatrist is providing close team supervision and 24 hour management of active medical problems listed below.  Physiatrist and rehab team continue to assess barriers to discharge/monitor patient progress toward functional and medical goals  Care Tool:  Bathing    Body parts bathed by patient: Abdomen   Body parts bathed by helper: Right arm, Left arm, Chest, Front perineal area, Buttocks, Right upper leg, Left upper leg, Right lower leg, Left lower leg, Face     Bathing assist Assist Level: Total Assistance - Patient < 25%     Upper Body Dressing/Undressing Upper body dressing   What is the patient wearing?: Hospital gown only    Upper body assist Assist Level: Total Assistance - Patient < 25%    Lower Body Dressing/Undressing Lower body dressing      What is the patient wearing?: Hospital gown only     Lower body assist Assist for lower body dressing: Total Assistance - Patient < 25%     Toileting Toileting    Toileting assist Assist for toileting: Total Assistance - Patient < 25%     Transfers Chair/bed transfer  Transfers assist     Chair/bed transfer  assist level: 2 Helpers     Locomotion Ambulation   Ambulation assist   Ambulation activity did not occur: Safety/medical concerns          Walk 10 feet activity   Assist  Walk 10 feet activity did not occur: Safety/medical concerns        Walk 50 feet activity   Assist Walk 50 feet with 2 turns activity did not occur: Safety/medical concerns         Walk 150 feet activity   Assist Walk 150 feet activity did not occur: Safety/medical concerns         Walk 10 feet on uneven surface  activity   Assist Walk 10 feet on uneven surfaces activity did not occur: Safety/medical concerns         Wheelchair     Assist Will  patient use wheelchair at discharge?: Yes Type of Wheelchair: Manual    Wheelchair assist level: Minimal Assistance - Patient > 75% Max wheelchair distance: 50(w/mult rest breaks)    Wheelchair 50 feet with 2 turns activity    Assist    Wheelchair 50 feet with 2 turns activity did not occur: Safety/medical concerns(unable to turn wc)       Wheelchair 150 feet activity     Assist  Wheelchair 150 feet activity did not occur: Safety/medical concerns       Blood pressure 117/66, pulse 80, temperature 98.8 F (37.1 C), resp. rate 20, weight 120.3 kg, SpO2 93 %.  Medical Problem List and Plan: 1.  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   4/21- doing better- can now feed self- con't therapy             -ELOS/Goals: 3-4 weeks- goals max assist- severely impaired  -Continue CIR PT, OT, SLP 2.  Antithrombotics: -DVT/anticoagulation: Lovenox 60 mg daily.  Vascular study negative             -antiplatelet therapy: N/A 3. Pain Management: Dilaudid 2 mg every 6 hours as needed pain 4. Mood: Melatonin 6 mg nightly, trazodone 100 mg nightly, Klonopin 1 mg nightly, Lexapro 20 mg daily, Valium 2.5 mg every 12 hours as needed anxiety  4/9- will try to get Neuropsych for pt due to anxiety.   4/10: Did not use valium today  4/12- neuropsych on schedule per SW  4/16- saw Neuropsych today- discussed SNF planning.              -antipsychotic agents: N/A 5. Neuropsych: This patient is capable of making decisions on his own behalf. 6. Skin/Wound/ Care/deep tissue pressure  injury bilateral heels and lateral feet: Wound care as directed  4/11: Ordered betadine dressings for bilateral feet as he was receiving in acute care.   4/14- consulted WOC: they asked for debridement- consulted [name]  4/16- haven't been able to tell if [name] came ot see pt- no note- will recheck Monday.  4/18- will call [name] Monday to see if can assess- didn't  ever see note.      4/19 [name] has seen patient. consvt mgt--->outpt f/u  4/20- will let WOC know [name] wants to see outpatient.  7. Fluids/Electrolytes/Nutrition: Strict INO's with follow-up chemistries  4/9- labs look good except BUN still 46- is dry? But is stable/better than was 1 week ago- was 51.  4/12- will recheck labs in AM   4/13- BUN down to 40- doing better- con't to monitor weekly.  4/19. I personally reviewed the patient's labs today.   Labs stable, BUN still 40   -discussed nutrition with patient today, encourage PO fluids 8.  Dysphagia.  Status post PEG tube 09/11/2019 per [name]. Worthy Keeler.  Diet has been advanced to regular.  Patient is still receiving nocturnal tube feeds by PEG tube.  4/9- doing calorie count, to see if can stop TFs  4/13- cannot stop TFs per dietician, since pt has high protein/calorie needs to aid healing, and is Dependent on staff to feed him everything.   4/20- asking dietician to move TFs to finish earlier so hungry for breakfast.   4/21- changed TFs from 6pm to 4am- 10 hours.  9.  Status post tracheostomy 08/06/2019.  Decannulated 11/08/2019. 10.  Hypertension.  Norvasc 10 mg daily, Coreg 25 mg twice daily, Aldactone 25 mg daily.  Monitor with increased mobility  4/9-4/10 BP well controlled- continue regimen. Addendum: Hypotensive to 106/49 at night; decreased amlodipine dose to 33m.   4/17- BP 99/52 this AM- will decrease norvasc to 2.5 mg daily.  4/19 BP a little better today 119/62--no changes  4/20- BP 110/60- doing better- con't meds 11.  History of asthma.  Continue nebulizers as  directed  4/15- will con't to wean O2- is at 1L currently  4/16- wrote to wean O2 order and told nursing again  4/17- sats 99%- wearing 1L O2- will d/w nursing again- I think it can come off, unless nursing can show he needs it- his sats are 95-99% on 1L. Wrote order to wean, again.  4/18- O2 dropped to 87% today after tolerating therapy without O2- will use prn.    4/19 weaning oxygen as possible. 1.5L this AM. Encouraged deep breathing OOB  4/20- off O2 this AM- sats 94%  4/21- off O2 12.  GERD.  Protonix 13.  BPH.  Flomax 0.4 mg daily. 14. Morbid obesity- current BMI of 40- will discuss diet, etc once more appropriate.  4/15- BMI down to 38.8- improving  15. Insomnia- con't meds as above  4/9-4/10 sleeps fine- is his normal amount of sleep  4/12- doesn't sleep well, but is chronic- on Klonopin AND trazodone 100 mg QHS  4/20- added benadryl to QHS prn 25 mg so if wakes up at night, can have benadryl 16. Right ear wax: Debrox ear drops  4/20- stopped drops 17. Nausea/loose stool  4/18- will schedule Zofran at 0600 am to help with AM nausea.  4/19 already on fiber. Will add florastor as well to improve stool consistency  4/20- suggest benadryl if occurring in middle of night-ordering.    LOS: 13 days A FACE TO FACE EVALUATION WAS PERFORMED  [name] 11/27/2019, 8:44 AM

## 2019-11-27 NOTE — Progress Notes (Signed)
Physical Therapy Session Note  Patient Details  Name: [name] MRN: [id number] Date of Birth: 11/28/1975  Today's Date: 11/27/2019 PT Individual Time: 4098-1191 and 1330-1450 PT Individual Time Calculation (min): 80 min   Short Term Goals: Week 2:  PT Short Term Goal 1 (Week 2): Pt will transition supine to sit using bed features w/min assist of 1 PT Short Term Goal 2 (Week 2): Pt will perform sit to lean on elbow to sit for placement of SB w/cga only PT Short Term Goal 3 (Week 2): bed to/from wc w/mod assist of 2 PT Short Term Goal 4 (Week 2): Pt wil tolerate 5 min continuous cardiovascular activity  Skilled Therapeutic Interventions/Progress Updates:     Session 1: Patient in bed eating breakfast upon PT arrival. Patient demonstrated that he was able to eat breakfast independently this morning. PT remained in the room in-case patient fatigued and needed assistance, however, started skilled therapy session 15 min late due to patient eating. Patient alert and agreeable to PT session. Patient reported 4-5/10 B foot pain during session, RN made aware. PT provided repositioning, rest breaks, and distraction as pain interventions throughout session. MD rounded at beginning of session and assessed wounds on B feet, no changes in wounds at this time.  He requested to use the urinal prior to performing mobility. Patient was continent of bladder using the urinal with total A for placement and peri-care.   Therapeutic Activity: Bed Mobility: Patient performed rolling R/L to don shorts with min A-CGA using bed rails and max-total A for LB dressing bed level. He performed supine to sit with mod A of 1 person in a flat bed without use of bed rails. Provided verbal cues for rolling to L side-lying then pushing to sit up with B UEs. Patient reported moderate dizziness once in sitting that resolved in <1 min. Sitting EOB he doffed a hospital gown and donned a t-shirt with mod-max A due to UE weakness  and decreased ROM. He performed lateral leans R/L x2 to finish pulling up pants in sitting with CGA-min A for mobility and max A for LB clothing management. Transfers: Patient performed a slide board transfer bed>TIS w/c with max-mod A +2 and total A for board placement. Provided cues for hand placement, board placement, and head-hips relationship for proper technique and decreased assist with transfers. Investment banker, corporate with patient seated in the TIS w/c for increased protection of plantar surfaces on his feet and LE's from the leg rests. SPO2 dropped to 85% following transfer and required ~1 min of pursed lip breathing with cues for patient to recover >90% on RA.  Patient in [place] w/c at end of session with breaks locked and all needs within reach. Patient remained on RA throughout session, SPO2 90%, except where noted.  Session 2: Patient in bed upon PT arrival. Patient alert and agreeable to PT session. Patient reported 4-5/10 B foot pain during session, RN made aware. PT provided repositioning, rest breaks, and distraction as pain interventions throughout session. He reported that he had performed transfers and exercises on the mat table earlier with OT and was fatigued, but felt that he had performed well. Focued session on vertical tolerance and LE weight bearing and strengthening using tilt feature of Kreg bed.   Therapeutic Activity: Bed Mobility: Patient performed rolling with supervision-mod I with use of bed rails. Provided verbal cues for using opposite LE to push to roll. Performed scooting up x3 in bed with max-total A of 1  with bed in trendelenburg.  Utilized tilt feature of Kreg bed for increased vertical tolerance and LE weight bearing: 0 degrees: BP 122/54, HR 89, SPO2 90% on RA (asymptomatic) 15 degrees: BP 119/45, HR 105, SPO2 92% on RA (asymptomatic x5 min) 28 degrees: BP 95/68, HR 92, SPO2 91% asymptomatic, performed quat sets and glut sets 2x10 with 5 sec hold 28 degrees:  BP 90/57, HR 88, SPO2 93% asymptomatic x5 min, performed mini squats x10 and UE bench press with towel 2x10 28 degrees: BP 104/31, HR 99, SPO2 93% symptomatic after 10 min Returned to 0 degrees: BP 99/45, HR 85, SPO2 93%  Patient remained on RA throughout session, SPO2 >90%.  Patient in bed with Prevalon boots donned and B UE's elevated at end of session with breaks locked, bed alarm set, and all needs within reach. Patient requires increased time for positioning in bed for comfort and set up due to UE ROM restrictions.    Therapy Documentation Precautions:  Precautions Precautions: Fall Precaution Comments: bilat wounds/feet, PEG Required Braces or Orthoses: Other Brace Other Brace: B prevalon boots; R wrist cock-up splint Restrictions Weight Bearing Restrictions: No RLE Weight Bearing: Non weight bearing LLE Weight Bearing: Non weight bearing Other Position/Activity Restrictions: to be clarified due to bilat wounds plantar and lateral aspect of feet    Therapy/Group: Individual Therapy  Yajayra Feldt L Neah Sporrer PT, DPT  11/27/2019, 4:29 PM

## 2019-11-27 NOTE — Progress Notes (Signed)
AM line care: Pt out of room. Discussed with Benjie Karvonen, LPN: Recommend removal of midline.

## 2019-11-27 NOTE — Progress Notes (Signed)
Nutrition Follow-up  DOCUMENTATION CODES:   Obesity unspecified  INTERVENTION:  -Ensure Enlive poQID, each supplement provides 350 kcal and 20 grams of protein -Feeding assistance with each meal and supplement  Adjustnocturnal tube feeding:  -Osmolite 1.5 @ 75 ml/hr via PEG x 10hrs (1800-0400) -Juven BID -60 ml Prostat QID (Please provide via tube NOT orally as pt will not take po)  At goal TF provides:1740kcal, 167grams protein, and 514m free water.  Pt with increased nutrient needs to aid in wound healing. It will be difficult for pt to receive enough po to meet these needs, especially considering his need for feeding assistance. RD would not recommend discontinuing nocturnal tube feeding at this time.   NUTRITION DIAGNOSIS:   Increased nutrient needs related to wound healing as evidenced by estimated needs.  Ongoing.  GOAL:   Patient will meet greater than or equal to 90% of their needs  Progressing.  MONITOR:   PO intake, Supplement acceptance, TF tolerance, Labs, I & O's  REASON FOR ASSESSMENT:   Consult Enteral/tube feeding initiation and management, Calorie Count  ASSESSMENT:   Pt with a PMH significant for asthma, HTN and pancreatitis. Pt initially presented 07/22/2019 with Covid PNA causing acute respiratory failure. Pt developed pneumomediastinum 12/23 with concern for possible bilateral small pneumothoraces 08/01/2019. Pt required intubation and later underwent bilateral chest tube placements.  TEE 08/03/2019 ejection fraction 70% without emboli.  Long-term intubation required tracheostomy completed 08/06/2019 that has been downsized and decannulated 11/08/2019. PEG tube placement 09/11/2019. 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 Diet has been advanced to regular thin liquids; pt still receiving some nocturnal tube feeds for nutritional support. Admitted to CIR on  4/8.  RD consulted to adjust nocturnal tube feeding regimen due to pt not eating breakfast in the morning. Pt states he has not been eating breakfast partially due to feeling full and partially due to not liking the breakfast options in rehab. RD adjusted schedule to stop at 0400 instead of 0600.   Pt states he like the Ensure, but does not like the taste of the Pro-stat and cannot drink it. Please utilize feeding tube for Pro-stat. Of note, pt informed RD at 1200 that he had not been offered an Ensure today day, but it is charted as being refused. Pt also noted that he can tolerate Juven orally, but this has also been charted as having been refused.  Of note, pt'scalorie and proteinneeds are still significantly elevated due to wound healing and it will be difficult for the patient to consume enough PO to help meet needs.   PO intake: 15-100% x last 8 recorded meals (78% average intake)  Labs reviewed. Medications reviewed and include: Ensure Enlive TID, Vitamin C, Novolog, Juven BID, 680mPro-stat QID, Fibercon, Zofran, Metamucil, Florastor, Aldactone  UOP: 1,60017m24 hours I/O: -14,101.8ml59mnce admit  Diet Order:   Diet Order            Diet regular Room service appropriate? Yes with Assist; Fluid consistency: Thin  Diet effective now              EDUCATION NEEDS:   No education needs have been identified at this time  Skin:  Skin Assessment: Skin Integrity Issues: Skin Integrity Issues:: DTI, Unstageable DTI: biltateral heels Unstageable: L pinky toe  Last BM:  4/20  Height:   Ht Readings from Last 1 Encounters:  08/09/19 5' 8"  (1.727 m)    Weight:  Wt Readings from Last 1 Encounters:  11/26/19 120.3 kg    BMI:  Body mass index is 40.33 kg/m.  Estimated Nutritional Needs:   Kcal:  3435-6861 (25-30 kcal/kg)  Protein:  180-215 grams  Fluid:  >/= 2.5 L/day   Larkin Ina, MS, RD, LDN RD pager number and weekend/on-call pager number located in  [place].

## 2019-11-28 ENCOUNTER — Inpatient Hospital Stay (HOSPITAL_COMMUNITY): Payer: Managed Care, Other (non HMO)

## 2019-11-28 ENCOUNTER — Inpatient Hospital Stay (HOSPITAL_COMMUNITY): Payer: Managed Care, Other (non HMO) | Admitting: Occupational Therapy

## 2019-11-28 LAB — CREATININE, SERUM
Creatinine, Ser: 0.89 mg/dL (ref 0.61–1.24)
GFR calc Af Amer: >60 mL/min (ref >60)
GFR calc non Af Amer: >60 mL/min (ref >60)

## 2019-11-28 LAB — GLUCOSE, CAPILLARY
Glucose-Capillary: 98 mg/dL (ref 70–99)
Glucose-Capillary: 98 mg/dL (ref 70–99)
Glucose-Capillary: 92 mg/dL (ref 70–99)
Glucose-Capillary: 121 mg/dL — ABNORMAL HIGH (ref 70–99)

## 2019-11-28 MED ORDER — JUVEN PO PACK
1.0000 | PACK | Freq: Two times a day (BID) | ORAL | Status: DC
  Administered 2019-11-28 – 2019-12-04 (×12): 1 via ORAL
  Filled 2019-11-28 (×7): qty 1

## 2019-11-28 NOTE — Progress Notes (Signed)
Physical Therapy Session Note  Patient Details  Name: [name] MRN: [id number] Date of Birth: 22-Jul-1976  Today's Date: 11/28/2019 PT Individual Time: 1105-1205 and 1421-1540 PT Individual Time Calculation (min): 60 min and 79 min   Short Term Goals: Week 2:  PT Short Term Goal 1 (Week 2): Pt will transition supine to sit using bed features w/min assist of 1 PT Short Term Goal 2 (Week 2): Pt will perform sit to lean on elbow to sit for placement of SB w/cga only PT Short Term Goal 3 (Week 2): bed to/from wc w/mod assist of 2 PT Short Term Goal 4 (Week 2): Pt wil tolerate 5 min continuous cardiovascular activity  Skilled Therapeutic Interventions/Progress Updates:     Session 1: Patient in bed upon PT arrival. Patient alert and agreeable to PT session. Patient reported 5/10 B knee achy pain during session, RN made aware. PT provided repositioning, rest breaks, and distraction as pain interventions throughout session. Donned B socks and tennis shoes with total A with patient in supine at beginning of session in preparation for functional mobility.  Therapeutic Activity: Bed Mobility: Patient performed rolling to L side-lying with min A for bending R LE and side-lying to sit with mod A of 1 person. Provided verbal cues for use of B UEs to push to sitting. Performed in a flat bed with min use of bed rails. Transfers: Patient performed a slide board transfer bed>TIS w/c and TIS w/c<>mat table with mod A +2 and total A for board placement. Provided cues for hand placement, board placement, and head-hips relationship for proper technique and decreased assist with transfers. Used 2" platform under feet due to elevated bed and w/c height. SPO2 dropped to mid 80's following transfers, required ~1 min of pursed lip breathing to recover to >90% on RA.  Wheelchair Mobility:  Patient was transported in the [place] w/c with total A throughout session for energy conservation and time  management.  Neuromuscular Re-ed: Patient performed the following trunk and UE motor control activities: -B lateral leans seated on mat table x5 -B shoulder flexion with elbows extended 2x5 -forward weight shift for LE weight bearing and trunk control  Patient in TIS w/c at end of session with breaks locked and all needs within reach. Doffed tennis shoes and socks and donned B Prevalon boots to protect and position patient's feet on the leg rests.  Session 2: Patient in bed using the bed pan with NT in the room upon PT arrival. Patient alert and agreeable to PT session. Patient reported 4-5/10 foot pain L>R during session, RN made aware. PT provided repositioning, rest breaks, and distraction as pain interventions throughout session. Focued session on vertical tolerance and LE weight bearing and strengthening using tilt feature of Kreg bed.   Therapeutic Activity: Bed Mobility: Patient performed rolling R/L x3 with min A for LE and trunk management to removed bed pan, perform peri-care, and pulling up pants with total A. Provided verbal cues for bringing up opposite LE to push himself to side-lying. He performed scooting up x1 in bed with max A of 1 with bed in trendelenburg, provided cues and assist for bending LEs to allow patient to assist with scooting.  Applied 1-4" and 1-6" ACE wrap to B LEs for [facility] management with use of standing feature in the bed. Donned B tennis shoes for protection of B plantar surfaces of feet due to wounds.  Utilized tilt feature of Kreg bed for increased vertical tolerance and LE weight  bearing: 0 degrees: BP 110/48, HR 84 (asymptomatic) 20 degrees: BP 122/93, HR 87 (asymptomatic x5 min) performed B shoulder flexion stretch with manual OP 2x30 sec, UE bench press with towel 2x10 30 degrees: BP 114/80, HR 92 (asymptomatic x5 min) performed mini squats 2x10  40 degrees: BP 101/63, HR 95, (asymptomatic x5 min) performed mini squats 2x10  Terminated trial due patient  reported decreased tolerance with weight bearing due to B foot pain  Educated patient about his progress and discussed differences between CIR and SNF setting and intensity. Patient continues to ask for increased time in CIR and is demonstrating steady progress with all mobility. Discussed with therapy and medical team, Dr. Dagoberto Ligas, all in agreement that the patient will benefit from increased time in a high intensity setting for maximal recovery of functional mobility before d/c to lower intensity setting.   Patient remained on RA throughout session, SPO2 >90%.  Patient in bed at end of session with breaks locked, bed alarm set, and all needs within reach.    Therapy Documentation Precautions:  Precautions Precautions: Fall Precaution Comments: bilat wounds/feet, PEG Required Braces or Orthoses: Other Brace Other Brace: B prevalon boots; R wrist cock-up splint Restrictions Weight Bearing Restrictions: No RLE Weight Bearing: Non weight bearing LLE Weight Bearing: Non weight bearing Other Position/Activity Restrictions: to be clarified due to bilat wounds plantar and lateral aspect of feet    Therapy/Group: Individual Therapy  Aksel Bencomo L Wesson Stith PT, DPT  11/28/2019, 5:01 PM

## 2019-11-28 NOTE — Progress Notes (Signed)
Occupational Therapy Session Note  Patient Details  Name: [name] MRN: [id number] Date of Birth: [birth year]  Today's Date: 11/28/2019 OT Individual Time: 0900-1005 OT Individual Time Calculation (min): 65 min    Short Term Goals: Week 2:  OT Short Term Goal 1 (Week 2): patient will complete supine to sitting position with mod A of one OT Short Term Goal 2 (Week 2): patient will complete rolling in bed with min A OT Short Term Goal 3 (Week 2): patient will complete UB dressing with min A OT Short Term Goal 4 (Week 2): patient will complete grooming and eating tasks with min a  Skilled Therapeutic Interventions/Progress Updates:    Patient in bed, alert and ready for therapy session.  Completed breakfast with set up.  LB bathing/dressing completed bed level - max A.  Rolling in bed min a.  Side lying to sitting position with mod A.  Unsupported sitting CS.  Completed UB bathing/dressing seated edge of bed with max A overall.  Completed lateral leans/push ups both right and left, weight shift and hip mobility activities in SSP.  Sit to supine with max A.  Completed trunk mobility activities bed level, UB AROM exercises.  He remained in bed at close of session, call bell and tray table in reach.    Therapy Documentation Precautions:  Precautions Precautions: Fall Precaution Comments: bilat wounds/feet, PEG Required Braces or Orthoses: Other Brace Other Brace: B prevalon boots; R wrist cock-up splint Restrictions Weight Bearing Restrictions: No RLE Weight Bearing: Non weight bearing LLE Weight Bearing: Non weight bearing Other Position/Activity Restrictions: to be clarified due to bilat wounds plantar and lateral aspect of feet   Therapy/Group: Individual Therapy  Carlos Levering 11/28/2019, 7:36 AM

## 2019-11-28 NOTE — Progress Notes (Signed)
Social Work Patient ID: [name], male   DOB: Nov 21, 1975, 44 y.o.   MRN: [id number]    11/27/2019- SW spoke with pt wife to provide updates from team conference. Family and pt agreeable on placement. Preferred SNF is [facility] ([facility] SW reviewed SNF placement process. SW informed pt will transition when medically ready. Requests if pt has therapy on Sunday if his therapy can be scheduled earlier or limited as they would like to do a mini celebration for him for his birthday. SW discussed with scheduling to request AM therapies if therapy on Sunday.  11/28/2019- NCPASRR#: [id number]. SNF referral sent out.    Loralee Pacas, MSW, [place] Office: [phone] Cell: [phone] Fax: [phone]

## 2019-11-28 NOTE — Progress Notes (Signed)
[place] PHYSICAL MEDICINE & REHABILITATION PROGRESS NOTE   Subjective/Complaints:  Thinks TFs got stopped early in AM, but thinks later than 4am stopped- was a little more hungry this AM.  LBM this AM- just now.    Not drinking enough metamucil is pt's thought why having BM in middle of night. Willing to drink more- if spaced out, but was asking if can go in TFs- explained cannot put in PEG since would go backwards.  Also hates prostat- would rather drink Juven.    ROS:   Pt denies SOB, abd pain, CP, N/V/C/D, and vision changes   Objective:   No results found. No results for input(s): WBC, HGB, HCT, PLT in the last 72 hours. Recent Labs    11/28/19 0410  CREATININE 0.89    Intake/Output Summary (Last 24 hours) at 11/28/2019 1058 Last data filed at 11/28/2019 0836 Gross per 24 hour  Intake 490 ml  Output 1676 ml  Net -1186 ml     Physical Exam: Vital Signs Blood pressure 122/72, pulse 87, temperature 98.1 F (36.7 C), resp. rate 20, weight 122.9 kg, SpO2 92 %.  Constitutional: No distress . Vital signs reviewed.sitting up in bed; feeding self, NAD HEENT: conjugate gaze Neck: supple Cardiovascular:RRR  Respiratory/Chest: CTA B/L- no W/R/R- good air movement GI/Abdomen:Soft, NT, ND, (+)BS  Ext: no clubbing, cyanosis, or edema Psych: flat affect- no change Musculoskeletal:     Cervical back: Normal range of motion and neck supple.     Comments: RUE- delt- 2/5, bicep 2/5, tricep 3-/5, WE 0/5, grip 3-/5, finger 0/5  LUE- deltoid 2/5, biceps 2 to 2-/5, triceps 4-/5, E 3/5, grip 3/5, finger 1/5 LEs B/L- HF 2-/5, KE and KF 2/5, DF 1/5; PF 2/5 Neurological: Ox3  CN exam intact. Reasonable insight and awareness. -  Pt reports sensation decreased in L thumb and RUE; normal in LEs to light touch No hoffman's or clonus B/L  Skin:  Abrasions dressed/exposed, stable  Bottom of feet have a thick black eschar 1/3 of it peeled off- ~ 2-11m thick  half of feet  B/L-reassessed again today- no change    Assessment/Plan: 1. 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  Physiatrist is providing close team supervision and 24 hour management of active medical problems listed below.  Physiatrist and rehab team continue to assess barriers to discharge/monitor patient progress toward functional and medical goals  Care Tool:  Bathing    Body parts bathed by patient: Abdomen   Body parts bathed by helper: Right arm, Left arm, Chest, Front perineal area, Buttocks, Right upper leg, Left upper leg, Right lower leg, Left lower leg, Face     Bathing assist Assist Level: Total Assistance - Patient < 25%     Upper Body Dressing/Undressing Upper body dressing   What is the patient wearing?: Hospital gown only    Upper body assist Assist Level: Total Assistance - Patient < 25%    Lower Body Dressing/Undressing Lower body dressing      What is the patient wearing?: Hospital gown only     Lower body assist Assist for lower body dressing: Total Assistance - Patient < 25%     Toileting Toileting    Toileting assist Assist for toileting: Total Assistance - Patient < 25%     Transfers Chair/bed transfer  Transfers assist     Chair/bed transfer assist level: 2 Helpers  Locomotion Ambulation   Ambulation assist   Ambulation activity did not occur: Safety/medical concerns          Walk 10 feet activity   Assist  Walk 10 feet activity did not occur: Safety/medical concerns        Walk 50 feet activity   Assist Walk 50 feet with 2 turns activity did not occur: Safety/medical concerns         Walk 150 feet activity   Assist Walk 150 feet activity did not occur: Safety/medical concerns         Walk 10 feet on uneven surface  activity   Assist Walk 10 feet on uneven surfaces activity did not occur:  Safety/medical concerns         Wheelchair     Assist Will patient use wheelchair at discharge?: Yes Type of Wheelchair: Manual    Wheelchair assist level: Minimal Assistance - Patient > 75% Max wheelchair distance: 50(w/mult rest breaks)    Wheelchair 50 feet with 2 turns activity    Assist    Wheelchair 50 feet with 2 turns activity did not occur: Safety/medical concerns(unable to turn wc)       Wheelchair 150 feet activity     Assist  Wheelchair 150 feet activity did not occur: Safety/medical concerns       Blood pressure 122/72, pulse 87, temperature 98.1 F (36.7 C), resp. rate 20, weight 122.9 kg, SpO2 92 %.  Medical Problem List and Plan: 1.  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   4/21- doing better- can now feed self- con't therapy  4/22- sw SENT SNF referral             -ELOS/Goals: 3-4 weeks- goals max assist- severely impaired  -Continue CIR PT, OT, SLP 2.  Antithrombotics: -DVT/anticoagulation: Lovenox 60 mg daily.  Vascular study negative             -antiplatelet therapy: N/A 3. Pain Management: Dilaudid 2 mg every 6 hours as needed pain 4. Mood: Melatonin 6 mg nightly, trazodone 100 mg nightly, Klonopin 1 mg nightly, Lexapro 20 mg daily, Valium 2.5 mg every 12 hours as needed anxiety  4/9- will try to get Neuropsych for pt due to anxiety.   4/10: Did not use valium today  4/12- neuropsych on schedule per SW  4/16- saw Neuropsych today- discussed SNF planning.              -antipsychotic agents: N/A 5. Neuropsych: This  patient is capable of making decisions on his own behalf. 6. Skin/Wound/ Care/deep tissue pressure injury bilateral heels and lateral feet: Wound care as directed  4/11: Ordered betadine dressings for bilateral feet as he was receiving in acute care.   4/14- consulted WOC: they asked for debridement- consulted Dr Sharol Given.   .      4/19 Dr Sharol Given has seen patient. consvt mgt--->outpt f/u  4/20- will let WOC know Dr Sharol Given wants to see outpatient.  7. Fluids/Electrolytes/Nutrition: Strict INO's  with follow-up chemistries  4/9- labs look good except BUN still 46- is dry? But is stable/better than was 1 week ago- was 51.  4/12- will recheck labs in AM   4/13- BUN down to 40- doing better- con't to monitor weekly.  4/19. I personally reviewed the patient's labs today.   Labs stable, BUN still 40   -discussed nutrition with patient today, encourage PO fluids 8.  Dysphagia.  Status post PEG tube 09/11/2019 per Dr. Worthy Keeler.  Diet has been advanced to regular.  Patient is still receiving nocturnal tube feeds by PEG tube.  4/9- doing calorie count, to see if can stop TFs  4/13- cannot stop TFs per dietician, since pt has high protein/calorie needs to aid healing, and is Dependent on staff to feed him everything.   4/20- asking dietician to move TFs to finish earlier so hungry for breakfast.   4/21- changed TFs from 6pm to 4am- 10 hours.  9.  Status post tracheostomy 08/06/2019.  Decannulated 11/08/2019. 10.  Hypertension.  Norvasc 10 mg daily, Coreg 25 mg twice daily, Aldactone 25 mg daily.  Monitor with increased mobility  4/9-4/10 BP well controlled- continue regimen. Addendum: Hypotensive to 106/49 at night; decreased amlodipine dose to 18m.   4/17- BP 99/52 this AM- will decrease norvasc to 2.5 mg daily.  4/19 BP a little better today 119/62--no changes  4/20- BP 110/60- doing better- con't meds 11.  History of asthma.  Continue nebulizers as directed  4/15- will con't to wean O2- is at 1L  currently  4/16- wrote to wean O2 order and told nursing again  4/17- sats 99%- wearing 1L O2- will d/w nursing again- I think it can come off, unless nursing can show he needs it- his sats are 95-99% on 1L. Wrote order to wean, again.  4/18- O2 dropped to 87% today after tolerating therapy without O2- will use prn.    4/21- off O2 12.  GERD.  Protonix 13.  BPH.  Flomax 0.4 mg daily. 14. Morbid obesity- current BMI of 40- will discuss diet, etc once more appropriate.  4/15- BMI down to 38.8- improving  15. Insomnia- con't meds as above  4/9-4/10 sleeps fine- is his normal amount of sleep  4/12- doesn't sleep well, but is chronic- on Klonopin AND trazodone 100 mg QHS  4/20- added benadryl to QHS prn 25 mg so if wakes up at night, can have benadryl  4/22- will take more metamucil so can try and avoid BM in middle of night.  16. Right ear wax: Debrox ear drops  4/20- stopped drops 17. Nausea/loose stool  4/18- will schedule Zofran at 0600 am to help with AM nausea.  4/19 already on fiber. Will add florastor as well to improve stool consistency  4/20- suggest benadryl if occurring in middle of night-ordering.   4/22- changed Prostat to Juven per pt request- take more metamucil   LOS: 14 days A FACE TO FACE EVALUATION WAS PERFORMED  Kamala Kolton 11/28/2019, 10:58 AM

## 2019-11-28 NOTE — Progress Notes (Addendum)
Advanced Heart Failure Rounding Note  PCP-Cardiologist: No primary care provider on file.   Subjective:    Admitted to CIR on 4/8   Continues to do well. Making progress w/ PT. Feels like he is getting stronger. Had session earlier today. Now resting comfortably in bed. Reports appetite is good. No cardiac complaints.   Last CMP 4/18 w/ normal SCr and electrolytes. SCr level checked today and normal at 0.89.   Objective:   Weight Range: 122.9 kg Body mass index is 41.2 kg/m.   Vital Signs:   Temp:  [97.9 F (36.6 C)-98.2 F (36.8 C)] 98.1 F (36.7 C) (04/22 0400) Pulse Rate:  [87-98] 87 (04/22 0400) Resp:  [18-20] 20 (04/22 0400) BP: (102-122)/(43-72) 122/72 (04/22 0400) SpO2:  [90 %-92 %] 92 % (04/22 0400) Weight:  [122.9 kg] 122.9 kg (04/22 0400) Last BM Date: 11/28/19  Weight change: Filed Weights   11/25/19 0500 11/26/19 0338 11/28/19 0400  Weight: 120.1 kg 120.3 kg 122.9 kg    Intake/Output:   Intake/Output Summary (Last 24 hours) at 11/28/2019 1150 Last data filed at 11/28/2019 0836 Gross per 24 hour  Intake 490 ml  Output 1676 ml  Net -1186 ml      Physical Exam   PHYSICAL EXAM: General:  Well appearing obese WM. No respiratory difficulty HEENT: normal anicteric Neck: supple. no JVD. Trach site well healed. Carotids 2+ bilat; no bruits. No lymphadenopathy or thyromegaly appreciated. Cor: PMI nondisplaced. Regular rate & rhythm. No rubs, gallops or murmurs. Lungs: clear no wheeze Abdomen: obese soft, nontender, nondistended. No hepatosplenomegaly. No bruits or masses. Good bowel sounds. Extremities: no cyanosis, clubbing, rash, edema Neuro: alert & oriented x 3, cranial nerves grossly intact. Remains weak but able to move all 4 extremities  Affect pleasant.    Labs    CBC No results for input(s): WBC, NEUTROABS, HGB, HCT, MCV, PLT in the last 72 hours. Basic Metabolic Panel Recent Labs    11/28/19 0410  CREATININE 0.89   Liver Function  Tests No results for input(s): AST, ALT, ALKPHOS, BILITOT, PROT, ALBUMIN in the last 72 hours. No results for input(s): LIPASE, AMYLASE in the last 72 hours. Cardiac Enzymes No results for input(s): CKTOTAL, CKMB, CKMBINDEX, TROPONINI in the last 72 hours.  BNP: BNP (last 3 results) Recent Labs    07/22/19 1039  BNP 15.3    ProBNP (last 3 results) No results for input(s): PROBNP in the last 8760 hours.   D-Dimer No results for input(s): DDIMER in the last 72 hours. Hemoglobin A1C No results for input(s): HGBA1C in the last 72 hours. Fasting Lipid Panel No results for input(s): CHOL, HDL, LDLCALC, TRIG, CHOLHDL, LDLDIRECT in the last 72 hours. Thyroid Function Tests No results for input(s): TSH, T4TOTAL, T3FREE, THYROIDAB in the last 72 hours.  Invalid input(s): FREET3  Other results:   Imaging    No results found.   Medications:     Scheduled Medications: . amLODipine  2.5 mg Oral Daily  . vitamin C  500 mg Oral Daily  . carvedilol  25 mg Oral BID WC  . chlorhexidine  15 mL Mouth Rinse BID  . clonazepam  1 mg Oral QHS  . enoxaparin (LOVENOX) injection  60 mg Subcutaneous Q24H  . escitalopram  20 mg Oral Daily  . feeding supplement (ENSURE ENLIVE)  237 mL Oral QID  . feeding supplement (OSMOLITE 1.5 CAL)  1,000 mL Per Tube Q24H  . Gerhardt's butt cream   Topical Daily  .  insulin aspart  0-15 Units Subcutaneous TID AC & HS  . loratadine  10 mg Oral Daily  . melatonin  6 mg Oral QHS  . montelukast  10 mg Oral QHS  . nutrition supplement (JUVEN)  1 packet Oral BID BM  . ondansetron  4 mg Oral Daily  . pantoprazole  40 mg Oral Daily  . polycarbophil  625 mg Oral Daily  . psyllium  1 packet Oral TID  . saccharomyces boulardii  250 mg Oral BID  . sodium chloride flush  10-40 mL Intracatheter Q12H  . spironolactone  25 mg Oral Daily  . tamsulosin  0.4 mg Oral QPC supper  . traZODone  100 mg Oral QHS    Infusions:   PRN Medications: acetaminophen,  albuterol, calcium carbonate, diazepam, diphenhydrAMINE, HYDROmorphone, ipratropium-albuterol, loperamide HCl, ondansetron, sodium chloride, sodium chloride flush, sorbitol    Assessment/Plan   1. H/O Acute hypoxic/hypercarbic respiratory failure due to COVID PNA: Remained markedly hypoxic despite full support. Intubated 12/26. S/p bilateral CTs 12/26 for pneumothoraces.  TEE on 12/26 LVEF 70% RV ok. VV ECMO begun 12/26. Tracheostomy 12/29.  COVID ARDS at this point. ECMO circuit changed 1/8 & 1/23. Crescent cannula placed 2/2 (femoral cannuals removed) - ECMO decannulated 3/4 - Trach decannulated 11/08/19 - Stable on room air.   2. COVID PNA: management of COVID infection per CCM. CXR with bilateral multifocal PNA progressing to likely ARDS.  - s/p 10 days of remdesivir - 12/16, 12/2 s/p tociluzimab - 12/17 s/p convalescent plasma - Decadron completed 1/11 - Resolved.   3. FEN - Now on D3 diet. - Speech following.  - Ongoing nocturnal feeds.  - no change   4. Hypernatremia - Resolved.   5. DTIs - On both feet.  Continue off load.  - WOC following  6.  Hypokelmia  Resolved.   7.  Diffuse muscle weakness. - CIR appreciated  - PT/OT/Speech working with him .   8. HTN -Stable and well controlled. Continue current regimen.   10. Urinary retention - Continue Flomax   Length of Stay: [address] PA-C  11/28/2019, 11:50 AM  Advanced Heart Failure Team Pager [phone] (M-F; 7a - 4p)  Please contact [facility] for night-coverage after hours (4p -7a ) and weekends on [email or web address]  Patient seen and examined with the above-signed Advanced Practice Provider and/or Housestaff. I personally reviewed laboratory data, imaging studies and relevant notes. I independently examined the patient and formulated the important aspects of the plan. I have edited the note to reflect any of my changes or salient points. I have personally discussed the plan with the patient  and/or family.  Continues to improve. Vitals stable. Exam improved. Labs look good. Wounds recovering. Appreciate CIR's care.  Glori Bickers, MD  5:50 PM

## 2019-11-28 NOTE — Progress Notes (Signed)
Social Work Patient ID: [name], male   DOB: 30-Sep-1975, 44 y.o.   MRN: [id number]   SW received message from [name] [phone] ext [phone]) that she received updated records and since pt is SNF, pt has been approved for 4 more days (through Sunday 4/25), with updates due on Monday (4/25) if unable to secure a bed. Reports that precert for SNF, the SNF will need to call [phone].  Loralee Pacas, MSW, [place] Office: [phone] Cell: [phone] Fax: [phone]

## 2019-11-29 ENCOUNTER — Inpatient Hospital Stay (HOSPITAL_COMMUNITY): Payer: Managed Care, Other (non HMO) | Admitting: Physical Therapy

## 2019-11-29 ENCOUNTER — Inpatient Hospital Stay (HOSPITAL_COMMUNITY): Payer: Managed Care, Other (non HMO)

## 2019-11-29 LAB — GLUCOSE, CAPILLARY
Glucose-Capillary: 130 mg/dL — ABNORMAL HIGH (ref 70–99)
Glucose-Capillary: 99 mg/dL (ref 70–99)
Glucose-Capillary: 78 mg/dL (ref 70–99)
Glucose-Capillary: 137 mg/dL — ABNORMAL HIGH (ref 70–99)

## 2019-11-29 NOTE — Progress Notes (Addendum)
Social Work Patient ID: [name], male   DOB: 08/23/1975, 44 y.o.   MRN: [id number]    SW called pt wife [name] ([phone]) to inform on preferred SNF- [facility] ([facility] declined due to not being in insurance network. SW informed will leave a list of approved SNF from [employer].  SW met with pt in room to inform on above.   SW returned phone call to pt wife [name] who reports received SNF list. Will review and follow-up with SW on Monday on preferences based on [employer] approved locations. SW encouraged family to review [employer] as well for SNF locations.   Loralee Pacas, MSW, [place] Office: [phone] Cell: [phone] Fax: [phone]

## 2019-11-29 NOTE — Progress Notes (Signed)
[facility] PHYSICAL MEDICINE & REHABILITATION PROGRESS NOTE   Subjective/Complaints:  Pt reports wasn't woken up having BM in middle of night last night, which he thinks is great- bowels working OK still, just not in middle of night.   Denies significant pain- off O2 and denies SOB.   Smiling intermittently, but sad will have to go to SNF sooner than latera, which is a little concerning, considering pt's progress with therapy just this week. .   ROS:   Pt denies SOB, abd pain, CP, N/V/C/D, and vision changes    Objective:   No results found. No results for input(s): WBC, HGB, HCT, PLT in the last 72 hours. Recent Labs    11/28/19 0410  CREATININE 0.89    Intake/Output Summary (Last 24 hours) at 11/29/2019 1318 Last data filed at 11/29/2019 0925 Gross per 24 hour  Intake 560 ml  Output 1275 ml  Net -715 ml     Physical Exam: Vital Signs Blood pressure 126/87, pulse 87, temperature 98.5 F (36.9 C), temperature source Oral, resp. rate 16, weight 122.9 kg, SpO2 95 %.  Constitutional: No distress . Vital signs reviewed. Sitting up in bed; off O2, smiling intermittently, NAD HEENT: conjugate gaze Neck: supple Cardiovascular:RRR- no JVD Respiratory/Chest:CTA B/L- no W/R/R- good air movement GI/Abdomen:Soft, NT, ND, (+)BS  Ext: no clubbing, cyanosis, or edema Psych: slightly brighter affect Musculoskeletal:     Cervical back: Normal range of motion and neck supple.     Comments: RUE- delt- 2/5, bicep 2/5, tricep 3-/5, WE 0/5, grip 3-/5, finger 0/5  LUE- deltoid 2/5, biceps 2 to 2-/5, triceps 4-/5, E 3/5, grip 3/5, finger 1/5 LEs B/L- HF 2-/5, KE and KF 2/5, DF 1/5; PF 2/5 Neurological: Ox3  CN exam intact. Reasonable insight and awareness. -  Pt reports sensation decreased in L thumb and RUE; normal in LEs to light touch No hoffman's or clonus B/L  Skin:   Bottom of feet have a thick black eschar 1/3 of it peeled off- ~ 2-17m thick  half of feet B/Lnot assessed  today    Assessment/Plan: 1. 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  Physiatrist is providing close team supervision and 24 hour management of active medical problems listed below.  Physiatrist and rehab team continue to assess barriers to discharge/monitor patient progress toward functional and medical goals  Care Tool:  Bathing    Body parts bathed by patient: Abdomen, Front perineal area, Chest, Right arm, Face   Body parts bathed by helper: Left arm, Buttocks, Right upper leg, Left upper leg, Right lower leg, Left lower leg     Bathing assist Assist Level: Maximal Assistance - Patient 24 - 49%     Upper Body Dressing/Undressing Upper body dressing   What is the patient wearing?: Hospital gown only    Upper body assist Assist Level: Total Assistance - Patient < 25%    Lower Body Dressing/Undressing Lower body dressing      What is the patient wearing?: Hospital gown only     Lower body assist Assist for lower body dressing: Total Assistance - Patient < 25%     Toileting Toileting    Toileting assist Assist for toileting: Total Assistance - Patient < 25%     Transfers Chair/bed transfer  Transfers assist     Chair/bed transfer assist level: 2 Helpers     Locomotion Ambulation   Ambulation assist  Ambulation activity did not occur: Safety/medical concerns          Walk 10 feet activity   Assist  Walk 10 feet activity did not occur: Safety/medical concerns        Walk 50 feet activity   Assist Walk 50 feet with 2 turns activity did not occur: Safety/medical concerns         Walk 150 feet activity   Assist Walk 150 feet activity did not occur: Safety/medical concerns         Walk 10 feet on uneven surface  activity   Assist Walk 10 feet on uneven surfaces activity did not occur: Safety/medical concerns          Wheelchair     Assist Will patient use wheelchair at discharge?: Yes Type of Wheelchair: Manual    Wheelchair assist level: Minimal Assistance - Patient > 75% Max wheelchair distance: 50(w/mult rest breaks)    Wheelchair 50 feet with 2 turns activity    Assist    Wheelchair 50 feet with 2 turns activity did not occur: Safety/medical concerns(unable to turn wc)       Wheelchair 150 feet activity     Assist  Wheelchair 150 feet activity did not occur: Safety/medical concerns       Blood pressure 126/87, pulse 87, temperature 98.5 F (36.9 C), temperature source Oral, resp. rate 16, weight 122.9 kg, SpO2 95 %.  Medical Problem List and Plan: 1.  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   4/21- doing better- can now feed self- con't therapy  4/22- sw SENT SNF referral  4/23- per SW, pt has to leave by next Monday or they won't pay more for rehab- concerning since pt making so MUCH gains in last 4 days to lessen burden of care.              -ELOS/Goals: 3-4 weeks- goals max assist- severely impaired  -Continue CIR PT, OT, SLP 2.  Antithrombotics: -DVT/anticoagulation: Lovenox 60 mg daily.  Vascular study negative             -antiplatelet therapy: N/A 3. Pain Management: Dilaudid 2 mg every 6 hours as needed pain 4. Mood: Melatonin 6 mg nightly, trazodone 100 mg nightly, Klonopin 1 mg nightly, Lexapro 20 mg daily, Valium 2.5 mg every 12 hours as needed anxiety  4/9- will try to get Neuropsych for pt due to anxiety.   4/10: Did not use valium  today  4/12- neuropsych on schedule per SW  4/16- saw Neuropsych today- discussed SNF planning.              -antipsychotic agents: N/A 5. Neuropsych: This patient is capable of making decisions on his own behalf. 6. Skin/Wound/ Care/deep tissue pressure injury bilateral heels and lateral feet: Wound care as directed  4/11: Ordered betadine dressings for bilateral feet as he was receiving in acute care.   4/14- consulted [facility]: they asked for debridement- consulted Dr Sharol Given. Marland Kitchen  4/23- will need to see Dr Sharol Given outpatient for debridement after he leaves for SNF.   7. Fluids/Electrolytes/Nutrition: Strict INO's with follow-up chemistries  4/9- labs look good except BUN still 46- is dry? But is stable/better than was 1 week ago- was 51.  4/12- will recheck labs in AM   4/13- BUN down to 40- doing better- con't to monitor weekly.  4/19. I personally reviewed the patient's labs today.   Labs stable, BUN still 40   -discussed nutrition with patient today, encourage PO fluids 8.  Dysphagia.  Status post PEG tube 09/11/2019 per Dr. Worthy Keeler.  Diet has been advanced to regular.  Patient is still receiving nocturnal tube feeds by PEG tube.  4/9- doing calorie count, to see if can stop TFs  4/13- cannot stop TFs per dietician, since pt has high protein/calorie needs to aid healing, and is Dependent on staff to feed him everything.   4/20- asking dietician to move TFs to finish earlier so hungry for breakfast.   4/21- changed TFs from 6pm to 4am- 10 hours.   4/23- changed Prostat to Juven 9.  Status post tracheostomy 08/06/2019.  Decannulated 11/08/2019. 10.  Hypertension.  Norvasc 10 mg daily, Coreg 25 mg twice daily, Aldactone 25 mg daily.  Monitor with increased mobility  4/9-4/10 BP well controlled- continue regimen. Addendum: Hypotensive to 106/49 at night; decreased amlodipine dose to 48m.   4/17- BP 99/52 this AM- will decrease norvasc to 2.5 mg daily.  4/19 BP a little better today 119/62--no  changes  4/20- BP 110/60- doing better- con't meds 11.  History of asthma.  Continue nebulizers as directed  4/17- sats 99%- wearing 1L O2- will d/w nursing again- I think it can come off, unless nursing can show he needs it- his sats are 95-99% on 1L. Wrote order to wean, again.  4/23- off O2- tolerating well 12.  GERD.  Protonix 13.  BPH.  Flomax 0.4 mg daily. 14. Morbid obesity- current BMI of 40- will discuss diet, etc once more appropriate.  4/15- BMI down to 38.8- improving   4/23- weighed again- BMI up to 41.2- going in wrong direction, but has lost so much weight since here/he' was malnourished, so likely needed 115 Insomnia- con't meds as above  4/9-4/10 sleeps fine- is his normal amount of sleep  4/12- doesn't sleep well, but is chronic- on Klonopin AND trazodone 100 mg QHS  4/20- added benadryl to QHS prn 25 mg so if wakes up at night, can have benadryl  4/22- will take more metamucil so can try and avoid BM in middle of night.  16. Right ear wax: Debrox ear drops  4/20- stopped drops 17. Nausea/loose stool  4/18- will schedule Zofran at 0600 am to help with AM nausea.  4/19 already on fiber. Will add florastor as well to improve stool consistency  4/20- suggest benadryl if occurring in middle of night-ordering.   4/22- changed Prostat to Juven per pt request- take more metamucil   LOS: 15 days A FACE TO FACE EVALUATION WAS PERFORMED  Taiquan Campanaro 11/29/2019, 1:18 PM

## 2019-11-29 NOTE — Progress Notes (Addendum)
Physical Therapy Session Note  Patient Details  Name: [name] MRN: [id number] Date of Birth: September 02, 1975  Today's Date: 11/29/2019 PT Individual Time: 1320-1420 PT Individual Time Calculation (min): 60 min   Short Term Goals: Week 2:  PT Short Term Goal 1 (Week 2): Pt will transition supine to sit using bed features w/min assist of 1 PT Short Term Goal 2 (Week 2): Pt will perform sit to lean on elbow to sit for placement of SB w/cga only PT Short Term Goal 3 (Week 2): bed to/from wc w/mod assist of 2 PT Short Term Goal 4 (Week 2): Pt wil tolerate 5 min continuous cardiovascular activity  Skilled Therapeutic Interventions/Progress Updates: Pt presents sitting position in bed, eating lunch.  Pt agreeable to therapy begrudgingly after eating lunch.  Pt requires min A for roll to right using siderails, but mod A to roll left, w/ verbal cues for use of UE across as well as flexed right knee.  Pt required multiple rolls to don shorts.  Total assist for bilateral ace wraps  to knee.  Pt required mod A and verbal cues for sup to sit.  Pt sat EOB eating sandwich w/ assist for bringing left hand to mouth.  Pt transfers slide board w/ assist of 2 w/ 6" platform under feet.  Pt performed seated reaching for Legos using bilateral UEs separately.  Pt returned to room and tilted TIS w/c back and all needs in place.  Spouse present.  Missed 15' of treatment time.     Therapy Documentation Precautions:  Precautions Precautions: Fall Precaution Comments: bilat wounds/feet, PEG Required Braces or Orthoses: Other Brace Other Brace: B prevalon boots; R wrist cock-up splint Restrictions Weight Bearing Restrictions: No RLE Weight Bearing: Non weight bearing LLE Weight Bearing: Non weight bearing Other Position/Activity Restrictions: to be clarified due to bilat wounds plantar and lateral aspect of feet General: PT Amount of Missed Time (min): 15 Minutes PT Missed Treatment Reason: Other (Comment)(Pt  eating.) Vital Signs: Therapy Vitals Temp: 98.2 F (36.8 C) Pulse Rate: 96 Resp: 20 BP: (!) 110/50 Patient Position (if appropriate): Sitting Oxygen Therapy SpO2: 94 % O2 Device: Room Air Pain:  c/o "achy" legs, knees and back.    Therapy/Group: Individual Therapy  [name] 11/29/2019, 2:29 PM

## 2019-11-29 NOTE — Progress Notes (Signed)
Occupational Therapy Session Note  Patient Details  Name: MALCOMB GANGEMI MRN: [id number] Date of Birth: 1976/01/10  Today's Date: 11/29/2019 OT Individual Time: 1100-1155 OT Individual Time Calculation (min): 55 min    Short Term Goals: Week 2:  OT Short Term Goal 1 (Week 2): patient will complete supine to sitting position with mod A of one OT Short Term Goal 2 (Week 2): patient will complete rolling in bed with min A OT Short Term Goal 3 (Week 2): patient will complete UB dressing with min A OT Short Term Goal 4 (Week 2): patient will complete grooming and eating tasks with min a  Skilled Therapeutic Interventions/Progress Updates:    Pt on bedpan in bed upon arrival.  Pt dependent for hygiene. Mod A+2 for rolling to R. Pt engaged in bathing/dressing at bed level 2/2 time constraints following cleaning after bowel movement. Pt able to bathe stomach, chest, front perineal, and RUE.  Pt unable to grasp wash cloth with R hand to bathe LUE. Pt required min A to wash face Pt able to pull into sitting position using bed rails but unable to maintain sitting without UE support. Pt did not have any clean clothing and opted to don hospital gown.  Shampoo cap provided and pt initiated rubbing head but required assistance for thoroughness.  Pt dependent for combing hair.  Pt agreed to chair position in bed with bedside table placed in front. Pt able to grasp cup and manipulate phone. Pt remained in bed with all needs within reach.   Therapy Documentation Precautions:  Precautions Precautions: Fall Precaution Comments: bilat wounds/feet, PEG Required Braces or Orthoses: Other Brace Other Brace: B prevalon boots; R wrist cock-up splint Restrictions Weight Bearing Restrictions: No RLE Weight Bearing: Non weight bearing LLE Weight Bearing: Non weight bearing Other Position/Activity Restrictions: to be clarified due to bilat wounds plantar and lateral aspect of feet    Pain: Pain Assessment Pain  Scale: 0-10 Pain Score: 4  Pain Type: Acute pain Pain Location: Arm Pain Orientation: Left;Right Pain Descriptors / Indicators: Aching Pain Frequency: Constant Pain Onset: On-going Patients Stated Pain Goal: 2 Pain Intervention(s): Repositioned  Therapy/Group: Individual Therapy  Leroy Libman 11/29/2019, 12:08 PM

## 2019-11-29 NOTE — Progress Notes (Signed)
Physical Therapy Session Note  Patient Details  Name: [name] MRN: [id number] Date of Birth: [birth year]  Today's Date: 11/29/2019 PT Individual Time: 0900-1010 PT Individual Time Calculation (min): 70 min   Short Term Goals: Week 2:  PT Short Term Goal 1 (Week 2): Pt will transition supine to sit using bed features w/min assist of 1 PT Short Term Goal 2 (Week 2): Pt will perform sit to lean on elbow to sit for placement of SB w/cga only PT Short Term Goal 3 (Week 2): bed to/from wc w/mod assist of 2 PT Short Term Goal 4 (Week 2): Pt wil tolerate 5 min continuous cardiovascular activity  Skilled Therapeutic Interventions/Progress Updates:    Pt received seated in bed finishing breakfast, agreeable to PT session. Pt with no complaints of pain at rest nor with mobility. Pt given option of transferring up to TIS w/c or performing tilt table standing via Kreg bed, pt chooses to work on standing this session. Assisted pt with donning BLE ACE wraps and shoes dependently. Supine BP 108/61, SpO2 92%. Pt reports urge to urinate, returned pt to sitting and he is dependent to max A to hold urinal in place, able to continently void, see Flowsheet for details. Pt then returned to supine and Kreg bed straps placed. Transitioned pt to 25 degrees, 35 degrees, then 40 degrees elevation. See vitals below. Pt demonstrates good tolerance for upright postioning via use of tilt table features on Kreg bed this date. Pt does have increase in respiratory rate with increase in elevation, cues for pursed lip breathing. Once at 40 degrees elevation pt able to perform mini-squats, 2 x 10 reps. Pt able to tolerate being upright at 40 degrees x 5 min with date with focus on BLE NMR via Steger. Assisted pt with doffing ACE wraps and shoes and donning Prevalon boots again at end of session. Pt left seated in bed with needs in reach at end of session.  Supine: BP 124/76, SpO2 92% 25 degrees: BP 121/59, SpO2 94% 35 degrees: BP  114/50, SpO2 95% 40 degrees: BP 109/66, SpO2 97%  Therapy Documentation Precautions:  Precautions Precautions: Fall Precaution Comments: bilat wounds/feet, PEG Required Braces or Orthoses: Other Brace Other Brace: B prevalon boots; R wrist cock-up splint Restrictions Weight Bearing Restrictions: No RLE Weight Bearing: Non weight bearing LLE Weight Bearing: Non weight bearing Other Position/Activity Restrictions: to be clarified due to bilat wounds plantar and lateral aspect of feet    Therapy/Group: Individual Therapy   Excell Seltzer, PT, DPT  11/29/2019, 12:36 PM

## 2019-11-30 ENCOUNTER — Inpatient Hospital Stay (HOSPITAL_COMMUNITY): Payer: Managed Care, Other (non HMO)

## 2019-11-30 LAB — GLUCOSE, CAPILLARY
Glucose-Capillary: 139 mg/dL — ABNORMAL HIGH (ref 70–99)
Glucose-Capillary: 105 mg/dL — ABNORMAL HIGH (ref 70–99)
Glucose-Capillary: 83 mg/dL (ref 70–99)
Glucose-Capillary: 138 mg/dL — ABNORMAL HIGH (ref 70–99)

## 2019-11-30 NOTE — Progress Notes (Signed)
Occupational Therapy Session Note  Patient Details  Name: [name] MRN: [id number] Date of Birth: Apr 30, 1976  Today's Date: 11/30/2019 OT Individual Time: 1115-1200 OT Individual Time Calculation (min): 45 min    Short Term Goals: Week 1:  OT Short Term Goal 1 (Week 1): Pt will transfer to w/c in prep for [facility] transfer wiht MAX A +2 in prep for toileting OT Short Term Goal 1 - Progress (Week 1): Progressing toward goal OT Short Term Goal 2 (Week 1): Pt will roll in B directions with MAX A of 1 caregiver to decrease BOC during LB dressing OT Short Term Goal 2 - Progress (Week 1): Met OT Short Term Goal 3 (Week 1): seated upright, pt will bring L hand to mouth in prep for self feeding/grooming OT Short Term Goal 3 - Progress (Week 1): Met OT Short Term Goal 4 (Week 1): Pt will thread 1UE into shirt with S OT Short Term Goal 4 - Progress (Week 1): Met  Skilled Therapeutic Interventions/Progress Updates:    1;1. Pt received in bed agreeable to OT. Pt requesting pain medicaiton if going to be left in w/c at end of session. OT dons pants, shoes and ACE total A. Pt completes rolling in B directions with MIN A for pt to advance pants past hips. MAX A of 1 supine>sitting EOB with A for LE mangement and trunk elevation. Pt completes lateral scoot transfer with VC for hand placement and MAX A of 1 to transfer into TIS with +2 steadying board. Pt completes bathing at sink with MOD A for UB and face. Total A to comb hair. MOD A to don shirt after pt threads BUE. Exited session with pt seated in TIS, tilted back, call light in reach and all needs met  Therapy Documentation Precautions:  Precautions Precautions: Fall Precaution Comments: bilat wounds/feet, PEG Required Braces or Orthoses: Other Brace Other Brace: B prevalon boots; R wrist cock-up splint Restrictions Weight Bearing Restrictions: No RLE Weight Bearing: Non weight bearing LLE Weight Bearing: Non weight bearing Other  Position/Activity Restrictions: to be clarified due to bilat wounds plantar and lateral aspect of feet General:   Vital Signs: Therapy Vitals Pulse Rate: 90 BP: 111/81 Pain: Pain Assessment Pain Scale: 0-10 Pain Score: 5  Pain Type: Acute pain Pain Location: Knee Pain Orientation: Right;Left Pain Descriptors / Indicators: Aching Pain Frequency: Constant Pain Onset: On-going Pain Intervention(s): Medication (See eMAR) ADL: ADL Eating: Dependent Grooming: Dependent Upper Body Bathing: Maximal assistance(MAX HOH A) Where Assessed-Upper Body Bathing: Edge of bed Lower Body Bathing: Dependent Where Assessed-Lower Body Bathing: Bed level Upper Body Dressing: Maximal assistance Where Assessed-Upper Body Dressing: Edge of bed Lower Body Dressing: Dependent(+2 to roll) Where Assessed-Lower Body Dressing: Bed level Toileting: Not assessed Toilet Transfer: Not assessed Vision   Perception    Praxis   Exercises:   Other Treatments:     Therapy/Group: Individual Therapy  Tonny Branch 11/30/2019, 12:08 PM

## 2019-12-01 LAB — GLUCOSE, CAPILLARY
Glucose-Capillary: 103 mg/dL — ABNORMAL HIGH (ref 70–99)
Glucose-Capillary: 121 mg/dL — ABNORMAL HIGH (ref 70–99)
Glucose-Capillary: 95 mg/dL (ref 70–99)
Glucose-Capillary: 144 mg/dL — ABNORMAL HIGH (ref 70–99)

## 2019-12-01 MED ORDER — CAMPHOR-MENTHOL 0.5-0.5 % EX LOTN
TOPICAL_LOTION | CUTANEOUS | Status: DC | PRN
  Filled 2019-12-01: qty 222

## 2019-12-01 MED ORDER — HYDROMORPHONE HCL 2 MG PO TABS
2.0000 mg | ORAL_TABLET | Freq: Four times a day (QID) | ORAL | Status: DC | PRN
  Administered 2019-12-01 – 2019-12-04 (×6): 2 mg via ORAL
  Filled 2019-12-01 (×6): qty 1

## 2019-12-01 NOTE — Progress Notes (Signed)
PEG tube clogged when came on shift. Flushed with 81m of warm water to clear tube with success. Osmolite 1.5 started at 2007 and ran for 9.5hr to stop at 0530 so pt would still have appetite for breakfast.  Klonopin, melatonin and trazadone given at 2141. Pt refused metamucil and CHG oral rinse. Pt still restless on and off. Trialed repositioning of boots, lotion and massage to BLE without much success and benadryl given 0228 to help with itching of BLE with moderate success. Pt had incont urinary episode overnight requiring bed change, LBM still 4/24.

## 2019-12-01 NOTE — Progress Notes (Signed)
Physical Therapy Weekly Progress Note  Patient Details  Name: [name] MRN: [id number] Date of Birth: January 16, 1976  Beginning of progress report period: November 22, 2019 End of progress report period: December 01, 2019  Today's Date: 12/01/2019   Patient has met 3 of 4 short term goals.  Patient is making good progress with physical therapy. He is able to perform rolling in bed min A-supervision with use of bed rails, supine to/from sit with min A, slide board transfers with mod A +2. He tolerates 40 degrees standing vertical in Kreg bed x5 min and static and dynamic sitting balance >10 min with supervision for safety. Patient is tolerated >3 hours of therapy per day and showing gains in generalized strength, activity tolerance, and functional mobility with high intensity therapy.   Patient continues to demonstrate the following deficits muscle weakness and muscle joint tightness, decreased cardiorespiratoy endurance and decreased sitting balance, decreased standing balance, decreased postural control and decreased balance strategies and therefore will continue to benefit from skilled PT intervention to increase functional independence with mobility.  Patient progressing toward long term goals..  Continue plan of care.  PT Short Term Goals Week 2:  PT Short Term Goal 1 (Week 2): Pt will transition supine to sit using bed features w/min assist of 1 PT Short Term Goal 1 - Progress (Week 2): Progressing toward goal PT Short Term Goal 2 (Week 2): Pt will perform sit to lean on elbow to sit for placement of SB w/cga only PT Short Term Goal 2 - Progress (Week 2): Met PT Short Term Goal 3 (Week 2): bed to/from wc w/mod assist of 2 PT Short Term Goal 3 - Progress (Week 2): Met PT Short Term Goal 4 (Week 2): Pt wil tolerate 5 min continuous cardiovascular activity PT Short Term Goal 4 - Progress (Week 2): Progressing toward goal Week 3:  PT Short Term Goal 1 (Week 3): Patietn will perform bed mobility  with CGA with use of bed features. PT Short Term Goal 2 (Week 3): Patient will perform bed>chair transfers with max A of 1 person. PT Short Term Goal 3 (Week 3): Patient will tolerate standing in Kreg bed >50 degrees x5 min for increase LE weight bearing and vertical tolerance.   Therapy Documentation Precautions:  Precautions Precautions: Fall Precaution Comments: bilat wounds/feet, PEG Required Braces or Orthoses: Other Brace Other Brace: B prevalon boots; R wrist cock-up splint Restrictions Weight Bearing Restrictions: No RLE Weight Bearing: Non weight bearing LLE Weight Bearing: Non weight bearing Other Position/Activity Restrictions: to be clarified due to bilat wounds plantar and lateral aspect of feet   Franz Svec L Adya Wirz PT, DPT  12/01/2019, 6:39 AM

## 2019-12-01 NOTE — Progress Notes (Signed)
PHYSICAL MEDICINE & REHABILITATION PROGRESS NOTE   Subjective/Complaints:  Pruritis in legs , mainly Left calf, reviewed med list noi new meds for >2wks  ROS:   Pt denies SOB, abd pain, CP, N/V/C/D, and vision changes    Objective:   No results found. No results for input(s): WBC, HGB, HCT, PLT in the last 72 hours. No results for input(s): NA, K, CL, CO2, GLUCOSE, BUN, CREATININE, CALCIUM in the last 72 hours.  Intake/Output Summary (Last 24 hours) at 12/01/2019 1101 Last data filed at 12/01/2019 0933 Gross per 24 hour  Intake 1413 ml  Output 2875 ml  Net -1462 ml     Physical Exam: Vital Signs Blood pressure (!) 106/58, pulse 97, temperature 98 F (36.7 C), resp. rate 18, weight 122.9 kg, SpO2 93 %.   General: No acute distress Mood and affect are appropriate Heart: Regular rate and rhythm no rubs murmurs or extra sounds Lungs: Clear to auscultation, breathing unlabored, no rales or wheezes Abdomen: Positive bowel sounds, soft nontender to palpation, nondistended Extremities: No clubbing, cyanosis, or edema   Musculoskeletal:     Cervical back: Normal range of motion and neck supple.     Comments: RUE- delt- 2/5, bicep 2/5, tricep 3-/5, WE 0/5, grip 3-/5, finger 0/5  LUE- deltoid 2/5, biceps 2 to 2-/5, triceps 4-/5, E 3/5, grip 3/5, finger 1/5 LEs B/L- HF 2-/5, KE and KF 2/5, DF 1/5; PF 2/5 Neurological: Ox3  CN exam intact. Reasonable insight and awareness. -   Skin:   Erythema scratch marks L>R calf no open areas   Assessment/Plan: 1. 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  Physiatrist is providing close team supervision and 24 hour management of active medical problems listed below.  Physiatrist and rehab team continue to assess barriers to discharge/monitor patient progress toward functional and medical goals  Care  Tool:  Bathing    Body parts bathed by patient: Abdomen, Front perineal area, Chest, Right arm, Face   Body parts bathed by helper: Left arm, Buttocks, Right upper leg, Left upper leg, Right lower leg, Left lower leg     Bathing assist Assist Level: Maximal Assistance - Patient 24 - 49%     Upper Body Dressing/Undressing Upper body dressing   What is the patient wearing?: Hospital gown only    Upper body assist Assist Level: Total Assistance - Patient < 25%    Lower Body Dressing/Undressing Lower body dressing      What is the patient wearing?: Hospital gown only     Lower body assist Assist for lower body dressing: Total Assistance - Patient < 25%     Toileting Toileting    Toileting assist Assist for toileting: Total Assistance - Patient < 25%     Transfers Chair/bed transfer  Transfers assist     Chair/bed transfer assist level: 2 Helpers     Locomotion Ambulation   Ambulation assist   Ambulation activity did not occur: Safety/medical concerns          Walk 10 feet activity   Assist  Walk 10 feet activity did not occur: Safety/medical concerns        Walk 50 feet activity   Assist Walk 50 feet with 2 turns activity did not occur: Safety/medical concerns         Walk 150 feet activity   Assist Walk 150 feet activity did not occur: Safety/medical concerns  Walk 10 feet on uneven surface  activity   Assist Walk 10 feet on uneven surfaces activity did not occur: Safety/medical concerns         Wheelchair     Assist Will patient use wheelchair at discharge?: Yes Type of Wheelchair: Manual    Wheelchair assist level: Minimal Assistance - Patient > 75% Max wheelchair distance: 50(w/mult rest breaks)    Wheelchair 50 feet with 2 turns activity    Assist    Wheelchair 50 feet with 2 turns activity did not occur: Safety/medical concerns(unable to turn wc)       Wheelchair 150 feet activity      Assist  Wheelchair 150 feet activity did not occur: Safety/medical concerns       Blood pressure (!) 106/58, pulse 97, temperature 98 F (36.7 C), resp. rate 18, weight 122.9 kg, SpO2 93 %.  Medical Problem List and Plan: 1.  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   4/21- doing better- can now feed self- con't therapy  4/22- sw SENT SNF referral  4/23- per SW, pt has to leave by next Monday or they won't pay more for rehab- concerning since pt making so MUCH gains in last 4 days to lessen burden of care.              -ELOS/Goals: 3-4 weeks- goals max assist- severely impaired  -Continue CIR PT, OT, SLP 2.  Antithrombotics: -DVT/anticoagulation: Lovenox 60 mg daily.  Vascular study negative             -antiplatelet therapy: N/A 3. Pain Management: Dilaudid 2 mg every 6 hours as needed pain 4. Mood: Melatonin 6 mg nightly, trazodone 100 mg nightly, Klonopin 1 mg nightly, Lexapro 20 mg daily, Valium 2.5 mg every 12 hours as needed anxiety  4/9- will try to get Neuropsych for pt due to anxiety.   4/10: Did not use valium today  4/12- neuropsych on schedule per SW  4/16- saw Neuropsych today- discussed SNF planning.              -antipsychotic agents: N/A 5. Neuropsych: This patient is capable of making decisions on his own behalf. 6. Skin/Wound/ Care/deep tissue pressure injury bilateral heels and lateral feet: Wound care as directed  4/11: Ordered betadine dressings for bilateral feet as he was receiving in acute care.   4/14- consulted [facility]: they asked for  debridement- consulted Dr Sharol Given. .      4/23- will need to see Dr Sharol Given outpatient for debridement after he leaves for SNF.  Pruritis L>R calf trial sarna lotion , ?irritation from prevalon boots   7. Fluids/Electrolytes/Nutrition: Strict INO's with follow-up chemistries  4/9- labs look good except BUN still 46- is dry? But is stable/better than was 1 week ago- was 51.  4/12- will recheck labs in AM   4/13- BUN down to 40- doing better- con't to monitor weekly.  4/19. I personally reviewed the patient's labs today.   Labs stable, BUN  still 40   -discussed nutrition with patient today, encourage PO fluids 8.  Dysphagia.  Status post PEG tube 09/11/2019 per Dr. Worthy Keeler.  Diet has been advanced to regular.  Patient is still receiving nocturnal tube feeds by PEG tube.  4/9- doing calorie count, to see if can stop TFs  4/13- cannot stop TFs per dietician, since pt has high protein/calorie needs to aid healing, and is Dependent on staff to feed him everything.   4/20- asking dietician to move TFs to finish earlier so hungry for breakfast.   4/21- changed TFs from 6pm to 4am- 10 hours.   4/23- changed Prostat to Juven 9.  Status post tracheostomy 08/06/2019.  Decannulated 11/08/2019. 10.  Hypertension.  Norvasc 10 mg daily, Coreg 25 mg twice daily, Aldactone 25 mg daily.  Monitor with increased mobility  4/9-4/10 BP well controlled- continue regimen. Addendum: Hypotensive to 106/49 at night; decreased amlodipine dose to 69m.   4/17- BP 99/52 this AM- will decrease norvasc to 2.5 mg daily.  4/19 BP a little better today 119/62--no changes  4/20- BP 110/60- doing better- con't meds 11.  History of asthma.  Continue nebulizers as directed  4/17- sats 99%- wearing 1L O2- will d/w nursing again- I think it can come off, unless nursing can show he needs it- his sats are 95-99% on 1L. Wrote order to wean, again.  4/23- off O2- tolerating well 12.  GERD.  Protonix 13.  BPH.  Flomax 0.4 mg daily. 14.  Morbid obesity- current BMI of 40- will discuss diet, etc once more appropriate.  4/15- BMI down to 38.8- improving   4/23- weighed again- BMI up to 41.2- going in wrong direction, but has lost so much weight since here/he' was malnourished, so likely needed 179 Insomnia- con't meds as above  4/9-4/10 sleeps fine- is his normal amount of sleep  4/12- doesn't sleep well, but is chronic- on Klonopin AND trazodone 100 mg QHS  4/20- added benadryl to QHS prn 25 mg so if wakes up at night, can have benadryl  4/22- will take more metamucil so can try and avoid BM in middle of night.  16. Right ear wax: Debrox ear drops  4/20- stopped drops 17. Nausea/loose stool  4/18- will schedule Zofran at 0600 am to help with AM nausea.  4/19 already on fiber. Will add florastor as well to improve stool consistency  4/20- suggest benadryl if occurring in middle of night-ordering.   4/22- changed Prostat to Juven per pt request- take more metamucil   LOS: 17 days A F[place] TO [place] [name] 12/01/2019, 11:01 AM

## 2019-12-02 ENCOUNTER — Inpatient Hospital Stay (HOSPITAL_COMMUNITY): Payer: Managed Care, Other (non HMO)

## 2019-12-02 ENCOUNTER — Inpatient Hospital Stay (HOSPITAL_COMMUNITY): Payer: Managed Care, Other (non HMO) | Admitting: Occupational Therapy

## 2019-12-02 LAB — COMPREHENSIVE METABOLIC PANEL
ALT: 15 U/L (ref 0–44)
AST: 13 U/L — ABNORMAL LOW (ref 15–41)
Albumin: 2.9 g/dL — ABNORMAL LOW (ref 3.5–5.0)
Alkaline Phosphatase: 76 U/L (ref 38–126)
Anion gap: 10 (ref 5–15)
BUN: 33 mg/dL — ABNORMAL HIGH (ref 6–20)
CO2: 29 mmol/L (ref 22–32)
Calcium: 9.7 mg/dL (ref 8.9–10.3)
Chloride: 98 mmol/L (ref 98–111)
Creatinine, Ser: 0.93 mg/dL (ref 0.61–1.24)
GFR calc Af Amer: >60 mL/min (ref >60)
GFR calc non Af Amer: >60 mL/min (ref >60)
Glucose, Bld: 98 mg/dL (ref 70–99)
Potassium: 4.4 mmol/L (ref 3.5–5.1)
Sodium: 137 mmol/L (ref 135–145)
Total Bilirubin: 0.5 mg/dL (ref 0.3–1.2)
Total Protein: 7.9 g/dL (ref 6.5–8.1)

## 2019-12-02 LAB — CBC WITH DIFFERENTIAL/PLATELET
Abs Immature Granulocytes: 0.09 10*3/uL — ABNORMAL HIGH (ref 0.00–0.07)
Basophils Absolute: 0.1 10*3/uL (ref 0.0–0.1)
Basophils Relative: 1 %
Eosinophils Absolute: 0.3 10*3/uL (ref 0.0–0.5)
Eosinophils Relative: 4 %
HCT: 42.7 % (ref 39.0–52.0)
Hemoglobin: 13.2 g/dL (ref 13.0–17.0)
Immature Granulocytes: 1 %
Lymphocytes Relative: 24 %
Lymphs Abs: 2 10*3/uL (ref 0.7–4.0)
MCH: 27.1 pg (ref 26.0–34.0)
MCHC: 30.9 g/dL (ref 30.0–36.0)
MCV: 87.7 fL (ref 80.0–100.0)
Monocytes Absolute: 0.7 10*3/uL (ref 0.1–1.0)
Monocytes Relative: 9 %
Neutro Abs: 5.2 10*3/uL (ref 1.7–7.7)
Neutrophils Relative %: 61 %
Platelets: 178 10*3/uL (ref 150–400)
RBC: 4.87 MIL/uL (ref 4.22–5.81)
RDW: 14.4 % (ref 11.5–15.5)
WBC: 8.4 10*3/uL (ref 4.0–10.5)
nRBC: 0 % (ref 0.0–0.2)

## 2019-12-02 LAB — GLUCOSE, CAPILLARY
Glucose-Capillary: 100 mg/dL — ABNORMAL HIGH (ref 70–99)
Glucose-Capillary: 84 mg/dL (ref 70–99)
Glucose-Capillary: 140 mg/dL — ABNORMAL HIGH (ref 70–99)

## 2019-12-02 NOTE — Progress Notes (Signed)
Physical Therapy Session Note  Patient Details  Name: [name] MRN: [id number] Date of Birth: 1976/01/28  Today's Date: 12/02/2019 PT Individual Time: 1102-1205 and  4627-0350 PT Individual Time Calculation (min): 63 min and 83 min   Short Term Goals: Week 3:  PT Short Term Goal 1 (Week 3): Patietn will perform bed mobility with CGA with use of bed features. PT Short Term Goal 2 (Week 3): Patient will perform bed>chair transfers with max A of 1 person. PT Short Term Goal 3 (Week 3): Patient will tolerate standing in Kreg bed >50 degrees x5 min for increase LE weight bearing and vertical tolerance.  Skilled Therapeutic Interventions/Progress Updates:     Session 1: Patient in TIS w/c upon PT arrival. Patient alert and agreeable to PT session. Patient reported 4/10 L ankle pain during session, RN made aware. PT provided repositioning, rest breaks, and distraction as pain interventions throughout session.   Therapeutic Activity: Bed Mobility: Patient performed supine to sit with min A and increased time and sit to supine with min-mod A for LE management on a mat table. Provided verbal cues for leaning to his R elbow then bringing his LE's up to side-lying for improved control with mobility, and performing rolling to side-lying before bringing LE's off the mat and pushing up with his top and and bottom elbow to sit up. Patient did not tolerate lying on his back on the mat table due to decreased padding on the table increased back and buttock pain. Transfers: Patient performed slide board transfers TIS w/c<>mat table and TIS w/c>bed with mod-max A of 1 person and second person SBA for safety and total A for board placement. 2" step placed under B LE's during transfer due to elevated bed and w/c height. Provided cues for hand placement, board placement, and head-hips relationship for proper technique and decreased assist with transfers. Patient very encouraged by his progress with transfers today  with only 1 person assisting. Patient with only mild SOB x1, and took appropriate rest breaks with fatigue to manage SOB with mobility. Provided cues for pursed lip breathing x1.  Wheelchair Mobility:  Patient was transported in the w/c with total A throughout session for energy conservation and time management.  Neuromuscular Re-ed: Patient performed the following seated LE and trunk motor control activities: -B knee extension/flexion in sitting 2x10 with focus on controlled movement throughout and full extension -lateral leans on a mat table x5 working toward pressure relief in the w/c -forward weight shift lifting hips off the w/c x2 for LE weight bearing and pre-standing activity  Patient in bed at end of session with breaks locked and all needs within reach. Patient expressed that he has been emotional about being in the hospital so long. Planned outdoor session for this week for emotional support during recovery.  Session 2: Patient in bed with his wife in the room upon PT arrival. Patient alert and agreeable to PT session. Patient reported mild back pain during session, RN reported that patient was pre-medicated prior to session. PT provided repositioning, rest breaks, and distraction as pain interventions throughout session. Focused session on standing tolerance and LE weight bearing.   Patient and his wife expressed concerns about patient's progress once transferred to a SNF. PT educated on benefits of intensive therapy and resources to equipment that could continue to promote patient's progress with mobility. Will discuss with rehab team in conference tomorrow about recommending additional time in CIR for patient to progress to standing and reduced assist  level with mobility prior to SNF placement. Patient and his wife appreciative of discussion. Cardiology NP came in during session and was updated on patient's progress and also endorsed a longer stay in CIR to benefit the patient's  recovery.   Also discussed patient's son being present for outdoor session this week. Plan for patient's mom to bring his son to meet patient and PT outside at the [facility] entrance on Wednesday afternoon. Educated patient about all present wearing masks at all times for patient's safety. Patient in agreement.  Therapeutic Activity: Bed Mobility: Patient performed rolling R/L x1 with CGA-min A using bed rails for LB clothing adjustment. Provided verbal cues for bringing up opposite LE to push himself to side-lying. He performed scooting up x1 in bed with max A of 1 and x1 with max A of 2 with bed in trendelenburg, provided cues and assist for bending LEs to allow patient to assist with scooting.  Applied 1-4" and 1-6" ACE wrap to B LEs for [facility] management with use of standing feature in the bed. Donned B tennis shoes for protection of B plantar surfaces of feet due to wounds.  Utilized tilt feature of Kreg bed for increased vertical tolerance and LE weight bearing: 0 degrees: BP 106/55, HR86 (asymptomatic) 20 degrees: BP 96/56, HR87 (asymptomatic x5 min) performed B shoulder flexion stretch with manual OP 2x30 sec 40degrees: BP 146/63, HR 90, (asymptomatic x5 min) performed mini squats 2x10 47 degrees: BP 152/121, HR 96, (asymptomatic x5 min) performed mini squats 2x10 with manual facilitation for knee extension due to increase weight bearing Terminated trial due elevated BP Returned patient to 0 degrees in supine x2 min: BP 121/86, HR 87  Patient in bed with his wife in the room at end of session with breaks locked, bed alarm set, and all needs within reach.     Therapy Documentation Precautions:  Precautions Precautions: Fall Precaution Comments: bilat wounds/feet, PEG Required Braces or Orthoses: Other Brace Other Brace: B prevalon boots; R wrist cock-up splint Restrictions Weight Bearing Restrictions: No RLE Weight Bearing: Non weight bearing LLE Weight Bearing: Non weight  bearing Other Position/Activity Restrictions: to be clarified due to bilat wounds plantar and lateral aspect of feet    Therapy/Group: Individual Therapy  Anjolaoluwa Siguenza L Lyndal Alamillo PT, DPT  12/02/2019, 4:51 PM

## 2019-12-02 NOTE — Progress Notes (Signed)
[name] PHYSICAL MEDICINE & REHABILITATION PROGRESS NOTE   Subjective/Complaints:  Pt reports no issues- LBM this AM, but around 5-6 am- not overnight.  They are stopping TFs on time, usually.    ROS:    Pt denies SOB, abd pain, CP, N/V/C/D, and vision changes     Objective:   No results found. Recent Labs    12/02/19 0628  WBC 8.4  HGB 13.2  HCT 42.7  PLT 178   Recent Labs    12/02/19 0628  NA 137  K 4.4  CL 98  CO2 29  GLUCOSE 98  BUN 33*  CREATININE 0.93  CALCIUM 9.7    Intake/Output Summary (Last 24 hours) at 12/02/2019 1938 Last data filed at 12/02/2019 0545 Gross per 24 hour  Intake --  Output 1300 ml  Net -1300 ml     Physical Exam: Vital Signs Blood pressure (!) 102/20, pulse 79, temperature 98.4 F (36.9 C), temperature source Oral, resp. rate 18, weight 122.7 kg, SpO2 95 %.   General: awake, sitting up and feeding self with s/u, NAD Brighter affect Heart:RRR Lungs: CTA B/L Abdomen: soft, NT, ND, (+)BS Extremities: No clubbing, cyanosis, or edema   Musculoskeletal:     Cervical back: Normal range of motion and neck supple.     Comments: RUE- delt- 2/5, bicep 2/5, tricep 3-/5, WE 0/5, grip 3-/5, finger 0/5  LUE- deltoid 2/5, biceps 2 to 2-/5, triceps 4-/5, E 3/5, grip 3/5, finger 1/5 LEs B/L- HF 2-/5, KE and KF 2/5, DF 1/5; PF 2/5 Neurological: Ox3  CN exam intact. Reasonable insight and awareness. -   Skin:   Erythema scratch marks L>R calf no open areas   Assessment/Plan: 1. 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  Physiatrist is providing close team supervision and 24 hour management of active medical problems listed below.  Physiatrist and rehab team continue to assess barriers to discharge/monitor patient progress toward functional and medical goals  Care Tool:  Bathing    Body parts bathed by patient:  Abdomen, Front perineal area, Chest, Right arm   Body parts bathed by helper: Left arm, Buttocks, Right upper leg, Left upper leg, Right lower leg, Left lower leg, Face     Bathing assist Assist Level: Maximal Assistance - Patient 24 - 49%     Upper Body Dressing/Undressing Upper body dressing   What is the patient wearing?: Pull over shirt    Upper body assist Assist Level: Maximal Assistance - Patient 25 - 49%    Lower Body Dressing/Undressing Lower body dressing      What is the patient wearing?: Pants     Lower body assist Assist for lower body dressing: Maximal Assistance - Patient 25 - 49%     Toileting Toileting    Toileting assist Assist for toileting: Total Assistance - Patient < 25%     Transfers Chair/bed transfer  Transfers assist     Chair/bed transfer assist level: Maximal Assistance - Patient 25 - 49% Chair/bed transfer assistive device: Sliding board   Locomotion Ambulation   Ambulation assist   Ambulation activity did not occur: Safety/medical concerns          Walk 10 feet activity   Assist  Walk 10 feet activity did not occur: Safety/medical concerns        Walk 50 feet activity   Assist Walk 50 feet with 2 turns activity did not  occur: Safety/medical concerns         Walk 150 feet activity   Assist Walk 150 feet activity did not occur: Safety/medical concerns         Walk 10 feet on uneven surface  activity   Assist Walk 10 feet on uneven surfaces activity did not occur: Safety/medical concerns         Wheelchair     Assist Will patient use wheelchair at discharge?: Yes Type of Wheelchair: Manual    Wheelchair assist level: Minimal Assistance - Patient > 75% Max wheelchair distance: 50(w/mult rest breaks)    Wheelchair 50 feet with 2 turns activity    Assist    Wheelchair 50 feet with 2 turns activity did not occur: Safety/medical concerns(unable to turn wc)       Wheelchair 150 feet  activity     Assist  Wheelchair 150 feet activity did not occur: Safety/medical concerns       Blood pressure (!) 102/20, pulse 79, temperature 98.4 F (36.9 C), temperature source Oral, resp. rate 18, weight 122.7 kg, SpO2 95 %.  Medical Problem List and Plan: 1.  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   4/21- doing better- can now feed self- con't therapy  4/22- sw SENT SNF referral  4/23- per SW, pt has to leave by next Monday or they won't pay more for rehab- concerning since pt making so MUCH gains in last 4 days to lessen burden of care.              -ELOS/Goals: 3-4 weeks- goals max assist- severely impaired  -Continue CIR PT, OT, SLP 2.  Antithrombotics: -DVT/anticoagulation: Lovenox 60 mg daily.  Vascular study negative             -antiplatelet therapy: N/A 3. Pain Management: Dilaudid 2 mg every 6 hours as needed pain 4. Mood: Melatonin 6 mg nightly, trazodone 100 mg nightly, Klonopin 1 mg nightly, Lexapro 20 mg daily, Valium 2.5 mg every 12 hours as needed anxiety  4/9- will try to get Neuropsych for pt due to anxiety.   4/10: Did not use valium today  4/12- neuropsych on schedule per SW  4/16- saw Neuropsych today- discussed SNF planning.              -antipsychotic agents: N/A 5. Neuropsych: This patient is capable of making decisions on his own behalf. 6. Skin/Wound/ Care/deep tissue pressure injury bilateral heels and lateral feet: Wound care as directed  4/11: Ordered betadine dressings for bilateral feet as he was receiving in acute care.   4/14-  consulted [facility]: they asked for debridement- consulted Dr [name] .      4/23- will need to see Dr [name] outpatient for debridement after he leaves for SNF.  Pruritis L>R calf trial sarna lotion , ?irritation from prevalon boots   7. Fluids/Electrolytes/Nutrition: Strict INO's with follow-up chemistries  4/9- labs look good except BUN still 46- is dry? But is stable/better than was 1  week ago- was 51.  4/12- will recheck labs in AM   4/13- BUN down to 40- doing better- con't to monitor weekly.  4/19. I personally reviewed the patient's labs today.   Labs stable, BUN still 40   -discussed nutrition  4/26- Bun down to 33- doing better  with patient today, encourage PO fluids 8.  Dysphagia.  Status post PEG tube 09/11/2019 per Dr. Worthy Keeler.  Diet has been advanced to regular.  Patient is still receiving nocturnal tube feeds by PEG tube.  4/9- doing calorie count, to see if can stop TFs  4/13- cannot stop TFs per dietician, since pt has high protein/calorie needs to aid healing, and is Dependent on staff to feed him everything.   4/20- asking dietician to move TFs to finish earlier so hungry for breakfast.   4/21- changed TFs from 6pm to 4am- 10 hours.   4/23- changed Prostat to Juven 9.  Status post tracheostomy 08/06/2019.  Decannulated 11/08/2019. 10.  Hypertension.  Norvasc 10 mg daily, Coreg 25 mg twice daily, Aldactone 25 mg daily.  Monitor with increased mobility  4/9-4/10 BP well controlled- continue regimen. Addendum: Hypotensive to 106/49 at night; decreased amlodipine dose to 17m.   4/17- BP 99/52 this AM- will decrease norvasc to 2.5 mg daily.  4/26- BP 102/50- a little low, but overall, doing better 11.  History of asthma.  Continue nebulizers as directed  4/17- sats 99%- wearing 1L O2- will d/w nursing again- I think it can come off, unless nursing can show he needs it- his sats are 95-99% on 1L. Wrote order to wean, again.  4/23- off O2- tolerating well 12.  GERD.  Protonix 13.   BPH.  Flomax 0.4 mg daily. 14. Morbid obesity- current BMI of 40- will discuss diet, etc once more appropriate.  4/15- BMI down to 38.8- improving   4/23- weighed again- BMI up to 41.2- going in wrong direction, but has lost so much weight since here/he' was malnourished, so likely needed 133 Insomnia- con't meds as above  4/9-4/10 sleeps fine- is his normal amount of sleep  4/12- doesn't sleep well, but is chronic- on Klonopin AND trazodone 100 mg QHS  4/20- added benadryl to QHS prn 25 mg so if wakes up at night, can have benadryl  4/22- will take more metamucil so can try and avoid BM in middle of night.  16. Right ear wax: Debrox ear drops  4/20- stopped drops 17. Nausea/loose stool  4/18- will schedule Zofran at 0600 am to help with AM nausea.  4/19 already on fiber. Will add florastor as well to improve stool consistency  4/20- suggest benadryl if occurring in middle of night-ordering.   4/22- changed Prostat to Juven per pt request- take more metamucil   LOS: 18 days A FACE TO FACE EVALUATION WAS PERFORMED  [name] 12/02/2019, 7:38 PM

## 2019-12-02 NOTE — Progress Notes (Addendum)
Advanced Heart Failure Rounding Note  PCP-Cardiologist: No primary care provider on file.   Subjective:    Admitted to CIR on 4/8   Continues to work with therapy. PT reporting  Improvements over the last few days.   Denies SOB.  Says he is feeling stronger.   Objective:   Weight Range: 122.7 kg Body mass index is 41.13 kg/m.   Vital Signs:   Temp:  [98.4 F (36.9 C)-98.5 F (36.9 C)] 98.4 F (36.9 C) (04/26 0445) Pulse Rate:  [74-96] 74 (04/26 0445) Resp:  [14-18] 14 (04/26 0445) BP: (126-142)/(68-85) 126/72 (04/26 0445) SpO2:  [90 %-92 %] 90 % (04/26 0445) Weight:  [122.7 kg] 122.7 kg (04/26 0500) Last BM Date: 12/02/19  Weight change: Filed Weights   11/26/19 0338 11/28/19 0400 12/02/19 0500  Weight: 120.3 kg 122.9 kg 122.7 kg    Intake/Output:   Intake/Output Summary (Last 24 hours) at 12/02/2019 1431 Last data filed at 12/02/2019 0545 Gross per 24 hour  Intake --  Output 1300 ml  Net -1300 ml      Physical Exam   General:  No resp difficulty. In bed anicteric   HEENT: normal Neck: supple. no JVD. Carotids 2+ bilat; no bruits. No lymphadenopathy or thryomegaly appreciated. Cor: PMI nondisplaced. Regular rate & rhythm. No rubs, gallops or murmurs. Lungs: clear on room air.  No wheeze.  Abdomen: obese soft, nontender, nondistended. No hepatosplenomegaly. No bruits or masses. Good bowel sounds. Extremities: no cyanosis, clubbing, rash, edema. RUE weakness. Eschar on both feet  Neuro: alert & orientedx3, cranial nerves grossly intact. moves all 4 extremities w/o difficulty. Affect pleasant   Labs    CBC Recent Labs    12/02/19 0628  WBC 8.4  NEUTROABS 5.2  HGB 13.2  HCT 42.7  MCV 87.7  PLT 789   Basic Metabolic Panel Recent Labs    12/02/19 0628  NA 137  K 4.4  CL 98  CO2 29  GLUCOSE 98  BUN 33*  CREATININE 0.93  CALCIUM 9.7   Liver Function Tests Recent Labs    12/02/19 0628  AST 13*  ALT 15  ALKPHOS 76  BILITOT 0.5   PROT 7.9  ALBUMIN 2.9*   No results for input(s): LIPASE, AMYLASE in the last 72 hours. Cardiac Enzymes No results for input(s): CKTOTAL, CKMB, CKMBINDEX, TROPONINI in the last 72 hours.  BNP: BNP (last 3 results) Recent Labs    07/22/19 1039  BNP 15.3    ProBNP (last 3 results) No results for input(s): PROBNP in the last 8760 hours.   D-Dimer No results for input(s): DDIMER in the last 72 hours. Hemoglobin A1C No results for input(s): HGBA1C in the last 72 hours. Fasting Lipid Panel No results for input(s): CHOL, HDL, LDLCALC, TRIG, CHOLHDL, LDLDIRECT in the last 72 hours. Thyroid Function Tests No results for input(s): TSH, T4TOTAL, T3FREE, THYROIDAB in the last 72 hours.  Invalid input(s): FREET3  Other results:   Imaging    No results found.   Medications:     Scheduled Medications: . amLODipine  2.5 mg Oral Daily  . vitamin C  500 mg Oral Daily  . carvedilol  25 mg Oral BID WC  . chlorhexidine  15 mL Mouth Rinse BID  . clonazepam  1 mg Oral QHS  . enoxaparin (LOVENOX) injection  60 mg Subcutaneous Q24H  . escitalopram  20 mg Oral Daily  . feeding supplement (ENSURE ENLIVE)  237 mL Oral QID  . feeding  supplement (OSMOLITE 1.5 CAL)  1,000 mL Per Tube Q24H  . Gerhardt's butt cream   Topical Daily  . insulin aspart  0-15 Units Subcutaneous TID AC & HS  . loratadine  10 mg Oral Daily  . melatonin  6 mg Oral QHS  . montelukast  10 mg Oral QHS  . nutrition supplement (JUVEN)  1 packet Oral BID BM  . ondansetron  4 mg Oral Daily  . pantoprazole  40 mg Oral Daily  . polycarbophil  625 mg Oral Daily  . psyllium  1 packet Oral TID  . saccharomyces boulardii  250 mg Oral BID  . sodium chloride flush  10-40 mL Intracatheter Q12H  . spironolactone  25 mg Oral Daily  . tamsulosin  0.4 mg Oral QPC supper  . traZODone  100 mg Oral QHS    Infusions:   PRN Medications: acetaminophen, albuterol, calcium carbonate, camphor-menthol, diazepam, diphenhydrAMINE,  HYDROmorphone, ipratropium-albuterol, loperamide HCl, ondansetron, sodium chloride, sodium chloride flush, sorbitol    Assessment/Plan   1. H/O Acute hypoxic/hypercarbic respiratory failure due to COVID PNA: Remained markedly hypoxic despite full support. Intubated 12/26. S/p bilateral CTs 12/26 for pneumothoraces.  TEE on 12/26 LVEF 70% RV ok. VV ECMO begun 12/26. Tracheostomy 12/29.  COVID ARDS at this point. ECMO circuit changed 1/8 & 1/23. Crescent cannula placed 2/2 (femoral cannuals removed) - ECMO decannulated 3/4 - Trach decannulated 11/08/19 - Remains stable on room air.   2. COVID PNA: management of COVID infection per CCM. CXR with bilateral multifocal PNA progressing to likely ARDS.  - s/p 10 days of remdesivir - 12/16, 12/2 s/p tociluzimab - 12/17 s/p convalescent plasma - Decadron completed 1/11 - Resolved.   3. FEN Tolerating diet.  - Speech following.  - Ongoing nocturnal feeds.  - Albumin trending up to 2.9  - no change   4. Hypernatremia - Resolved.   5. DTIs - On both feet.  Continue off load.  - WOC following  6.  Hypokelmia  Resolved.   7.  Diffuse muscle weakness. - CIR appreciated  - PT/OT/Speech working with him. He has made dramatic improvements.    8. HTN -Stable and well controlled. Continue current regimen.   10. Urinary retention - Resolved.  Continue Flomax    Length of Stay: [place], NP  12/02/2019, 2:31 PM  Advanced Heart Failure Team Pager [phone] (M-F; 7a - 4p)  Please contact [facility] for night-coverage after hours (4p -7a ) and weekends on [email or web address]  Patient seen and examined with the above-signed Advanced Practice Provider and/or Housestaff. I personally reviewed laboratory data, imaging studies and relevant notes. I independently examined the patient and formulated the important aspects of the plan. I have edited the note to reflect any of my changes or salient points. I have personally discussed the plan  with the patient and/or family.  Continues to improve with CIR therapy. Hemodynamically stable.   Ideally would stay for another week to continue with therapy but it seems like insurance issues may lead to d/c this week. I feel strongly that given his rate of progress he would benefit dramatically from another week of CIR therapy if able.   Glori Bickers, MD  6:18 PM

## 2019-12-02 NOTE — Progress Notes (Addendum)
Social Work Patient ID: [name], male   DOB: [birth year], 44 y.o.   MRN: [id number]   SW spoke with Genie/[employer] [phone] ext [phone]/f:[phone]) requesting clinical updates. SW informed SNF referrals have been sent out.   SW left message for [name] ([phone]) to follow-up about the SNF referral list (from [facility] website) provided and if she has reviewed for preference. SW provided updates on SNF locations that have extended a bed offer: [facility] [place], [facility], [place], [place], [place], and [place]. SW waiting on follow-up.   *SW returned phone call to [name] who provided preferred SNF locations: [facility], [facility], and [facility]. SW to move forward with [facility] at family request.   SW spoke with Tammy/Admissions with [facility] [phone]) who reports will begin authorization process.  *Tammy reported she called [employer] to begin authorization for SNF. States she was informed they will follow-up with the hospital CM to obtain records. SW waiting on updates.   Loralee Pacas, MSW, [place] Office: [phone] Cell: [phone] Fax: [phone]

## 2019-12-02 NOTE — Progress Notes (Signed)
Occupational Therapy Weekly Progress Note  Patient Details  Name: [name] MRN: [id number] Date of Birth: [birth year]  Beginning of progress report period: November 25, 2019 End of progress report period: December 02, 2019  Today's Date: 12/02/2019 OT Individual Time: 0858-1000 OT Individual Time Calculation (min): 62 min    Patient has met 2 of 4 short term goals.  He is able to move trunk and has improved seated balance allowing for increased bed mobility.  UB strength and mobility improving and he is able to feed himself when at bed level with proximal UB supported.  At w/c level and in unsupported sitting he continues to struggle with lifting UEs for reach to complete grooming/oral care tasks and get shirt OH.    Patient continues to demonstrate the following deficits: muscle weakness and muscle joint tightness, decreased cardiorespiratoy endurance and decreased sitting balance, decreased standing balance, decreased postural control and decreased balance strategies and therefore will continue to benefit from skilled OT intervention to enhance overall performance with BADL, iADL, Vocation and Reduce care partner burden.  Patient progressing toward long term goals..  Continue plan of care.  OT Short Term Goals Week 2:  OT Short Term Goal 1 (Week 2): patient will complete supine to sitting position with mod A of one OT Short Term Goal 1 - Progress (Week 2): Met OT Short Term Goal 2 (Week 2): patient will complete rolling in bed with min A OT Short Term Goal 2 - Progress (Week 2): Met OT Short Term Goal 3 (Week 2): patient will complete UB dressing with min A OT Short Term Goal 3 - Progress (Week 2): Progressing toward goal OT Short Term Goal 4 (Week 2): patient will complete grooming and eating tasks with min a OT Short Term Goal 4 - Progress (Week 2): Progressing toward goal Week 3:  OT Short Term Goal 1 (Week 3): Patient will complete rolling mod I with rails OT Short Term Goal 2 (Week  3): patient will complete supine to sitting edge of bed with min A OT Short Term Goal 3 (Week 3): patient will complete grooming tasks min a with UB support OT Short Term Goal 4 (Week 3): Patient will complete UB dressing with min A  Skilled Therapeutic Interventions/Progress Updates:    patient in bed.  States that he is not having any pain at this time.  LB bathing and dressing completed at bed level.  He is able to reach abdomin, groin and upper thighs for washing - requires max A overall for thoroughness and lower legs/buttocks.  Increased reach to lower legs in long sit position with HOB elevated - continues to require max A for pants over feet.  CM complete via rolling side to side with max A.  Rolling in bed with min A.  Side lying to sitting with mod A.  He is able to tolerate unsupported sitting edge of bed with DS for UB bathing and dressing.  Max A for UB bathing, mod/max A for OH shirt.  SB transfer bed to w/c with Max A of one and mod A of 2nd person.  Completed oral care w/c level with max A (needs assist for elbow support to facilitate hand to mouth).  He remained in the w/c at close of session in reclined position.  Call bell and tray table in reach.    Therapy Documentation Precautions:  Precautions Precautions: Fall Precaution Comments: bilat wounds/feet, PEG Required Braces or Orthoses: Other Brace Other Brace: B prevalon boots;  R wrist cock-up splint Restrictions Weight Bearing Restrictions: No RLE Weight Bearing: Non weight bearing LLE Weight Bearing: Non weight bearing Other Position/Activity Restrictions: to be clarified due to bilat wounds plantar and lateral aspect of feet   Pain: Pain Assessment Pain Scale: 0-10 Pain Score: 0-No pain   Therapy/Group: Individual Therapy  Carlos Levering 12/02/2019, 12:38 PM

## 2019-12-03 ENCOUNTER — Inpatient Hospital Stay (HOSPITAL_COMMUNITY): Payer: Managed Care, Other (non HMO)

## 2019-12-03 ENCOUNTER — Inpatient Hospital Stay (HOSPITAL_COMMUNITY): Payer: Managed Care, Other (non HMO) | Admitting: Occupational Therapy

## 2019-12-03 LAB — GLUCOSE, CAPILLARY
Glucose-Capillary: 99 mg/dL (ref 70–99)
Glucose-Capillary: 86 mg/dL (ref 70–99)
Glucose-Capillary: 78 mg/dL (ref 70–99)
Glucose-Capillary: 130 mg/dL — ABNORMAL HIGH (ref 70–99)

## 2019-12-03 LAB — SARS CORONAVIRUS 2 (TAT 6-24 HRS): SARS Coronavirus 2: NEGATIVE

## 2019-12-03 NOTE — Progress Notes (Signed)
Occupational Therapy Discharge Summary  Patient Details  Name: [name] MRN: [id number] Date of Birth: 1976/07/08     Patient has met 2 of 9 long term goals due to improved activity tolerance, improved balance, postural control and functional use of  RIGHT upper and LEFT upper extremity.  Patient to discharge at [facility] Max Assist level.  Patient's care partner understands level of assistance needed at this time and is able to direct care at next level of care.   Reasons goals not met: patient with prolonged hospitalization with severe mobility deficits.  He has made tremendous gains in his short time in IP rehab but unable to reach a level of care that his family can manage with the rehab time allowed  Recommendation:  Patient will benefit from ongoing skilled OT services in skilled nursing facility setting to continue to advance functional skills in the area of BADL, iADL, Vocation and Reduce care partner burden.  Equipment: No equipment provided - to be provided at next level of care  Reasons for discharge: transfer to SNF level of care due to estimated length of time required to regain strength and mobility after debilitating illness  Patient/family agrees with progress made and goals achieved: Yes  OT Discharge Precautions/Restrictions  Precautions Precautions: Fall Precaution Comments: bilat wounds/feet, PEG Other Brace: B prevalon boots Restrictions Weight Bearing Restrictions: No RLE Weight Bearing: Weight bearing as tolerated LLE Weight Bearing: Weight bearing as tolerated General   Vital Signs Therapy Vitals Pulse Rate: 95 Resp: 20 BP: (!) 101/54 Patient Position (if appropriate): Lying Oxygen Therapy SpO2: 92 % O2 Device: Room Air Pain   ADL ADL Eating: Set up, Other (comment)(able to feed self in upright position with support of bed - requires min A for hand to mouth in unsupported sitting) Where Assessed-Eating: Bed level, Edge of bed Grooming:  Maximal assistance Where Assessed-Grooming: Sitting at sink Upper Body Bathing: Maximal assistance Where Assessed-Upper Body Bathing: Edge of bed Lower Body Bathing: Maximal assistance Where Assessed-Lower Body Bathing: Bed level(long sit position) Upper Body Dressing: Maximal assistance Where Assessed-Upper Body Dressing: Edge of bed Lower Body Dressing: Maximal assistance Where Assessed-Lower Body Dressing: Bed level(long sit position for pants over feet and up to knees, supine via rolling and bridging for CM) Toileting: Maximal assistance Where Assessed-Toileting: Bed level Toilet Transfer: Not assessed Vision Baseline Vision/History: No visual deficits Vision Assessment?: No apparent visual deficits Perception  Perception: Within Functional Limits Praxis   Cognition Overall Cognitive Status: Within Functional Limits for tasks assessed Arousal/Alertness: Awake/alert Orientation Level: Oriented X4 Memory: Appears intact Safety/Judgment: Appears intact Sensation Sensation Proprioception: Appears Intact Coordination Gross Motor Movements are Fluid and Coordinated: No Fine Motor Movements are Fluid and Coordinated: No Coordination and Movement Description: impaired due to significant LE/UE ROM and strength deficits Finger Nose Finger Test: right unable, left with significant effort and proximal support 9 Hole Peg Test: unable Motor  Motor Motor - Discharge Observations: improved reach and use of bilateral UEs for self care - able to manipulate objects with left hand, gross grasp right, ongoing proximal weakness limits reach Mobility  Bed Mobility Rolling Right: Supervision/verbal cueing Rolling Left: Minimal Assistance - Patient > 75% Left Sidelying to Sit: Moderate Assistance - Patient 50-74% Sit to Sidelying Left: Maximal Assistance - Patient 25-49%  Trunk/Postural Assessment  Cervical Assessment Cervical Assessment: Exceptions to [facility] Thoracic Assessment Thoracic  Assessment: Exceptions to [facility] Lumbar Assessment Lumbar Assessment: Exceptions to [facility] Postural Control Postural Control: Deficits on evaluation Postural Limitations: proximal weakness,  rounded shoulders  Balance Balance Balance Assessed: Yes Static Sitting Balance Static Sitting - Level of Assistance: 5: Stand by assistance Dynamic Sitting Balance Dynamic Sitting - Level of Assistance: 5: Stand by assistance Extremity/Trunk Assessment RUE Assessment Passive Range of Motion (PROM) Comments: shoulder flex/abd 100, ER 45, distal WFL Active Range of Motion (AROM) Comments: less than 10% against gravity shoulder flex/abd when unsupported, less than 10% elbow flex, el ext full, wrist 1/2 against gravity, weak grasp General Strength Comments: able to hold items with right hand, able to push through full arm to assist with transfers LUE Assessment Passive Range of Motion (PROM) Comments: shoulder flex/abd 120, ER 60, distal WFL Active Range of Motion (AROM) Comments: shoulder flex/abd in supported position of bed 3/4 (without support 1/4), elbow flex full ( for one rep - fatigues quickly)  elbow ext full, wrist/hand full General Strength Comments: able to feed himself and use phone with proximal support, min A for hand to mouth in unsupported sitting.   [name] 12/03/2019, 4:54 PM

## 2019-12-03 NOTE — Progress Notes (Signed)
Physical Therapy Session Note  Patient Details  Name: [name] MRN: [id number] Date of Birth: April 29, 1976  Today's Date: 12/03/2019 PT Individual Time: 1045-1130 and 1404-1502 PT Individual Time Calculation (min): 45 min   Short Term Goals: Week 3:  PT Short Term Goal 1 (Week 3): Patietn will perform bed mobility with CGA with use of bed features. PT Short Term Goal 2 (Week 3): Patient will perform bed>chair transfers with max A of 1 person. PT Short Term Goal 3 (Week 3): Patient will tolerate standing in [place] bed >50 degrees x5 min for increase LE weight bearing and vertical tolerance.  Skilled Therapeutic Interventions/Progress Updates:     Session 1:  Patient in standard w/c with Roho cushion upon PT arrival, reported that he had been sitting up for 30 min prior to increased buttock pain sitting up-right. B LE ACE wraps and tennis shoes donned prior to session. Patient alert and agreeable to PT session. Patient denied pain during session, however, reported severe itching on low back with slight rash. Applied ani-itch lotion over patient's back with some relief. Patient then accepted a call from his wife and requested a few minutes to speak to her. Patient missed 15 min of skilled PT due to a phone call with his wife, RN made aware. Will attempt to make-up missed time as able.   Therapeutic Activity: Bed Mobility: Patient performed sit to supine with mod A of 2 due to fatigue and dizziness at end of session. Provided verbal cues for performing R side-lying to supine. He performed rolling R/L x2 with min A of 1-2 with use of bed rails and cues/facilitation for bringing opposite LE up to push over. He also performed scooting up in bed with max A of 2 using B LEs to push up. Transfers: Patient performed sit to/from stand x2 in the [place] with max assist +2. Provided verbal cues for scooting forward in the w/c with min A, forward weight shift, bringing hips forward and using B UE to pull up as  much as he was able due to UE weakness. The first stand resulted in the patient coming almost to full standing with hips and knee flexed, the second stand resulted in full knee extension and increased hip extension and able to place steady seat flaps under the patient. He then stood from the [place] seat with mod A +2 and sat with max A +2 to control descent to the bed. Provided demonstration of use of steady and encouragement prior to attempting standing due to patient expressing some anxiety about standing for the first time.  Patient reported increased dizziness once lying down that resolved <30 seconds in lying.   Wheelchair Mobility:  Patient was transported in the w/c to/from the rehab gym with total A throughout session for energy conservation and time management.  Patient in bed at end of session with breaks locked and all needs within reach. Doffed B ACE wraps and tennis shoes and donned B Prevalon boots with max-total A at end of session.  Session 2: Patient in bed upon PT arrival. Patient alert and agreeable to PT session. Patient denied pain during session, however, reported increased fatigue from previous therapy sessions. Discussed patient's progress with mobility, patient very excited about standing in the [place] today and education on progression for standing mobility and pre-gait training as tolerated. Patient stated understanding and willingness to work toward these goals.  Therapeutic Activity: Bed Mobility: Patient performed supine to/from sit with mod A +2 due to increased  UE and LE fatigue. Patient reported eating lunch seated EOB with OT in previous session causing increased UE fatigue. Provided verbal cues for performing log rolling for increased trunk control. He performed reciprocal scooting EOB x2 with min A. Tolerated sitting EOB ~8-10 min focused on utilizing LEs to stabilize his with increased weight bearing. Patient stated looking forward to planned outside PT session with his  son tomorrow afternoon. Discussed realistic mobility goals when he returns home and potential need for a w/c. Educated on SNF providing equipment needs at d/c. Patient returned to supine due to fatigue and performed scooting up in bed with max A+2 as above.   Therapeutic Exercise: Patient performed the following exercises with verbal and tactile cues for proper technique. -B UE flexion, abduction, IR, and ER stretch in supine 2x30 sec hold for increased ROM and reduced soreness following increased use with therapy -B ankle DF stretch 2x30 sec -B hip IR/ER with focus on controlled motion 3x10 (educated on performing this exercise in the bed at least 2x per day for improved hip IR in supine) -B AAROM SLR 2x5   Patient in bed at end of session with breaks locked and all needs within reach.     Therapy Documentation Precautions:  Precautions Precautions: Fall Precaution Comments: bilat wounds/feet, PEG Required Braces or Orthoses: Other Brace Other Brace: B prevalon boots; R wrist cock-up splint Restrictions Weight Bearing Restrictions: No    Therapy/Group: Individual Therapy  Modell Fendrick L Shaylan Tutton PT, DPT  12/03/2019, 12:42 PM

## 2019-12-03 NOTE — Plan of Care (Signed)
Problem: Sit to Stand Goal: LTG:  Patient will perform sit to stand with assistance level (PT) Description: LTG:  Patient will perform sit to stand with assistance level (PT) Flowsheets (Taken 12/03/2019 1210) LTG: PT will perform sit to stand in preparation for functional mobility with assistance level: (Downgraded goal due to change in POC for patient to go SNF from CIR to continue to progress to mod I prior to d/c home.) Maximal Assistance - Patient 25 - 49% Note: Downgraded goal due to change in POC for patient to go SNF from CIR to continue to progress to mod I prior to d/c home.   Problem: RH Bed Mobility Goal: LTG Patient will perform bed mobility with assist (PT) Description: LTG: Patient will perform bed mobility with assistance, with/without cues (PT). Flowsheets (Taken 12/03/2019 1210) LTG: Pt will perform bed mobility with assistance level of: (Downgraded goal due to change in POC for patient to go SNF from CIR to continue to progress to mod I prior to d/c home.) Minimal Assistance - Patient > 75% Note: Downgraded goal due to change in POC for patient to go SNF from CIR to continue to progress to mod I prior to d/c home.   Problem: RH Bed to Chair Transfers Goal: LTG Patient will perform bed/chair transfers w/assist (PT) Description: LTG: Patient will perform bed to chair transfers with assistance (PT). Flowsheets (Taken 12/03/2019 1210) LTG: Pt will perform Bed to Chair Transfers with assistance level: (Downgraded goal due to change in POC for patient to go SNF from CIR to continue to progress to mod I prior to d/c home.) Moderate Assistance - Patient 50 - 74% Note: Downgraded goal due to change in POC for patient to go SNF from CIR to continue to progress to mod I prior to d/c home.   Problem: RH Car Transfers Goal: LTG Patient will perform car transfers with assist (PT) Description: LTG: Patient will perform car transfers with assistance (PT). Outcome: Not Applicable Flowsheets  (Taken 12/03/2019 1210) LTG: Pt will perform car transfers with assist:: (d/c goal due to change in POC for patient to go SNF from CIR to continue to progress to mod I prior to d/c home.) -- Note: D/c goal due to change in POC for patient to go SNF from CIR to continue to progress to mod I prior to d/c home.   Problem: RH Ambulation Goal: LTG Patient will ambulate in controlled environment (PT) Description: LTG: Patient will ambulate in a controlled environment, # of feet with assistance (PT). Flowsheets (Taken 12/03/2019 1210) LTG: Pt will ambulate in controlled environ  assist needed:: (downgraded goal) Maximal Assistance - Patient 25 - 49% LTG: Ambulation distance in controlled environment: Patient will initiate gait training/pre-gait Note: Downgraded goal due to change in POC for patient to go SNF from CIR to continue to progress to mod I prior to d/c home. Goal: LTG Patient will ambulate in home environment (PT) Description: LTG: Patient will ambulate in home environment, # of feet with assistance (PT). Outcome: Not Applicable Flowsheets (Taken 12/03/2019 1210) LTG: Pt will ambulate in home environ  assist needed:: (D/c goal due to change in POC for patient to go SNF from CIR to continue to progress to mod I prior to d/c home.) -- Note: D/c goal due to change in POC for patient to go SNF from CIR to continue to progress to mod I prior to d/c home.   Problem: RH Wheelchair Mobility Goal: LTG Patient will propel w/c in controlled environment (PT) Description:  LTG: Patient will propel wheelchair in controlled environment, # of feet with assist (PT) Flowsheets (Taken 11/15/2019 1233 by Jerrilyn Cairo, PT) LTG: Pt will propel w/c in controlled environ  assist needed:: Independent with assistive device LTG: Propel w/c distance in controlled environment: 50 Goal: LTG Patient will propel w/c in home environment (PT) Description: LTG: Patient will propel wheelchair in home environment, # of  feet with assistance (PT). Flowsheets (Taken 11/15/2019 1233 by Jerrilyn Cairo, PT) LTG: Pt will propel w/c in home environ  assist needed:: Independent with assistive device LTG: Propel w/c distance in home environment: 50   Problem: RH Stairs Goal: LTG Patient will ambulate up and down stairs w/assist (PT) Description: LTG: Patient will ambulate up and down # of stairs with assistance (PT) Outcome: Not Applicable Flowsheets (Taken 12/03/2019 1210) LTG: Pt will ambulate up/down stairs assist needed:: (D/c goal due to change in POC for patient to go SNF from CIR to continue to progress to mod I prior to d/c home.) -- Note: D/c goal due to change in POC for patient to go SNF from CIR to continue to progress to mod I prior to d/c home.

## 2019-12-03 NOTE — Patient Care Conference (Signed)
Inpatient RehabilitationTeam Conference and Plan of Care Update Date: 12/03/2019   Time: 11:35 AM    Patient Name: [name]      Medical Record Number: [id number]  Date of Birth: 04-10-1976 Sex: Male         Room/Bed: 4W11C/4W11C-01 Payor Info: Payor: CIGNA / Plan: CIGNA MANAGED / Product Type: *No Product type* /    Admit Date/Time:  11/14/2019  2:11 PM  Primary Diagnosis:  Critical illness myopathy  Patient Active Problem List   Diagnosis Date Noted  . Eschar of foot   . 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  . History of COVID-19   . ARDS (adult respiratory distress syndrome) ([place])   . Pressure injury of skin 08/22/2019  . Need for emotional support   . Chest tube in place   . Personal history of ECMO   . Advanced care planning/counseling discussion   . Acute respiratory distress syndrome (ARDS) due to COVID-19 virus ([place])   . Palliative care by specialist   . Goals of care, counseling/discussion   . Advanced directives, counseling/discussion   . Subcutaneous crepitus   . Pneumonia due to COVID-19 virus 07/23/2019  . Acute respiratory failure ([place]) 07/22/2019  . Persistent asthma with undetermined severity   . Thrombocytopenia ([place])   . Leukopenia   . Fever   . Gram positive bacterial infection   . Septic arthritis of right acromioclavicular joint ([place]) 09/29/2017  . Chronic left-sided low back pain with left-sided sciatica 09/19/2017  . Epigastric pain   . Pancreatitis 04/17/2017  . Essential hypertension 04/17/2017  . Moderate persistent asthma 07/21/2015  . Allergic rhinoconjunctivitis 07/21/2015  . GERD (gastroesophageal reflux disease) 07/21/2015  . ABNORMALITY OF GAIT 11/12/2009  . TARSAL TUNNEL SYNDROME, LEFT 10/08/2009  . PES PLANUS 10/08/2009    Expected Discharge Date: Expected Discharge Date: (Anticipate SNF placement at [place].)  Team Members Present: Physician leading conference: Dr. Courtney Heys Care Coodinator Present: Karene Fry, RN,  MSN;Christina Kai Levins, [place] Nurse Present: Rayne Du, LPN PT Present: Apolinar Junes, PT OT Present: Elisabeth Most, OT SLP Present: Weston Anna, SLP PPS Coordinator present : Ileana Ladd, Burna Mortimer, SLP     Current Status/Progress Goal Weekly Team Focus  Bowel/Bladder   Pt is continent of B/B; still uses urinal and bedpan.  To remain continent  Assess tolieting needs q2 or as needed.   Swallow/Nutrition/ Hydration             ADL's   rolling in bed min A, SB transfer max A with min/mod A of 2nd, UB bathing and dressing mod A, LB bathing/dressing max A, eating set up, oral care w/c level max A  min a  UB/trunk strength and mobility, ADL training, balance, transfer training, endurance/mobility   Mobility   Min A-supervision rolling with rails, min-mod bed mobility, mod-max +1 SBT with 2" step under feet, needs +2 assist with fatigue, wc propulsion 5-10ft w/UEs and min assist  Min A bed mobility and transfers, mod I w/c mobility 50 ft, max A gait 10 ft  Transfers, bed mobility, global strength, ankle and knee ROM, wc propulsion, endurance, initiate standing and standing tolerance, sitting tolerance, patient/caregiver education   Communication             Safety/Cognition/ Behavioral Observations            Pain   Complaints of back, bilateral knee pain; 7/10. Given dilaudid prn  To keep pain level less than 2  assess pain q shift  or as needed.   Skin   Has MASD on buttocks, celluitis bilateral legs  To prevent any further skin breakdown  Assess skin q shift or prn    Rehab Goals Patient on target to meet rehab goals: Yes *See Care Plan and progress notes for long and short-term goals.     Barriers to Discharge  Current Status/Progress Possible Resolutions Date Resolved   Nursing                  PT  Home environment access/layout;Decreased caregiver support;Lack of/limited family support  Patient's wife unable to provide physical  assist at home, needs patient to be mod I, has 6 STE home  Patient is progressing with mobility, planning to d/c SNF from rehab prior to going home to reach mod I level           OT                  SLP                SW                Discharge Planning/Teaching Needs:  D/C to SNF. SNF pending insurance approval  Family education as recommended by therapy   Team Discussion: MD med stable, TFs end at 4 am, eating better, Dr. Sharol Given f/u in clinic for feet.  RN pain with activity, drsg changed to feet.  OT roll R S, roll L min A, sitting S, max LB dressing, UB dressing mod/max, grooming max, transfers max A.  PT transfers max A, steady X 2 with max A +2.   Revisions to Treatment Plan: N/A     Medical Summary Current Status: asking for less A esp for meals; c/o pain with Activity- managed; doing dressing on feet; continent Weekly Focus/Goal: OT- severe proximal weakness- rolling min A-Sup; ADLs- max assist LB dressing now; UB mod-max A; standard w/c today- used sink to help with grooming- max assist;  Barriers to Discharge: Decreased family/caregiver support;Home enviroment access/layout;Other (comments)  Barriers to Discharge Comments: severe ICU myopathy; PEG(+) Possible Resolutions to Barriers: PT- great progress- occ transfers of 1 not 2 max assist; 2 stands in steady- used steady to transfers- max A of 2; not safe for nurse;   Continued Need for Acute Rehabilitation Level of Care: The patient requires daily medical management by a physician with specialized training in physical medicine and rehabilitation for the following reasons: Direction of a multidisciplinary physical rehabilitation program to maximize functional independence : Yes Medical management of patient stability for increased activity during participation in an intensive rehabilitation regime.: Yes Analysis of laboratory values and/or radiology reports with any subsequent need for medication adjustment and/or medical  intervention. : Yes   I attest that I was present, lead the team conference, and concur with the assessment and plan of the team.   [name] 12/03/2019, 2:25 PM   Team conference was held via web/ teleconference due to [place] - 19

## 2019-12-03 NOTE — Progress Notes (Signed)
Occupational Therapy Session Note  Patient Details  Name: [name] MRN: [id number] Date of Birth: 29-Jul-1976  Today's Date: 12/03/2019 OT Individual Time: 7353-2992  &  1258-1330 OT Individual Time Calculation (min): 55 min & 32 min   Short Term Goals: Week 3:  OT Short Term Goal 1 (Week 3): Patient will complete rolling mod I with rails OT Short Term Goal 2 (Week 3): patient will complete supine to sitting edge of bed with min A OT Short Term Goal 3 (Week 3): patient will complete grooming tasks min a with UB support OT Short Term Goal 4 (Week 3): Patient will complete UB dressing with min A  Skilled Therapeutic Interventions/Progress Updates:    AM session:   Patient in bed, ready for therapy session.  LB bathing and dressing completed bed level - working on reaching feet in long sit position and clothing management via bridging and rolling side to side - overall max A.  Dependent for ace wraps and shoes.  Side lying to sitting with mod a.  Tolerates unsupported sitting with CS.  UB bathing and dressing completed edge of bed with max A.  SB transfer bed to standard w/c with roho max A of one with min A of second person.  Able to sit forward with improved reach to sink in standard chair - able to prop elbows on sink for increased reach to face/head - continues to require max A for grooming and washing face.  Patient able to push w/c backward with bilateral LEs and propel w/c forward with bilateral UEs 5 feet x 3 (fatigue noted and increased time to complete).  Completed UB bicep/tricep exercises and reviewed written HEP.  He demonstrated good carryover of exercises provided. He remained seated in w/c at close of session with call bell and tray table accessible.     PM session:   Patient in bed,  Lunch tray just delivered.  Supine to sitting edge of bed with HOB elevated min a to manage bilateral LEs and min a at trunk.  He tolerated unsupported sitting for 20 minutes with activity of eating  sandwich and chips with left hand - he is able to get hand to tray and pick up food items, requires min a for hand to mouth in this position.  Able to grasp cup - min A for cup to mouth.  Attempts to use right hand for eating - able to grasp but requires mod/max a for hand to mouth.  Returned to supine position at end of session due to fatigue with max A.  Call bell and tray table in reach.    Therapy Documentation Precautions:  Precautions Precautions: Fall Precaution Comments: bilat wounds/feet, PEG Required Braces or Orthoses: Other Brace Other Brace: B prevalon boots; R wrist cock-up splint Restrictions Weight Bearing Restrictions: No RLE Weight Bearing: Non weight bearing LLE Weight Bearing: Non weight bearing Other Position/Activity Restrictions: to be clarified due to bilat wounds plantar and lateral aspect of feet   Therapy/Group: Individual Therapy  Carlos Levering 12/03/2019, 7:35 AM

## 2019-12-03 NOTE — Consult Note (Signed)
[place] Nurse wound follow up Patient receiving care in [facility] 4W11. Wound type: Unstageable PIs to bilateral, plantar, lateral feet Measurement: 17cm x 4 cm for both Wound bed: Hard, dry, stable, black, eschar Drainage (amount, consistency, odor) none Periwound: intact Dressing procedure/placement/frequency: Continue betadine/iodine and foam dressings. Monitor the wound area(s) for worsening of condition such as: Signs/symptoms of infection,  Increase in size,  Development of or worsening of odor, Development of pain, or increased pain at the affected locations.  Notify the medical team if any of these develop. Val Riles, RN, MSN, CWOCN, CNS-BC, pager [phone]

## 2019-12-03 NOTE — Progress Notes (Signed)
Social Work Patient ID: [name], male   DOB: [birth year], 44 y.o.   MRN: [id number]   SW met pt in room to provide updates from team conference on gains made. SW did discuss with patient waiting on insurance authorization for SNF. Pt is aware once there is approval, transition to SNF will proceed.   *SW received updates from Tammy/Admissions with [facility] at [facility] [phone]) stating received insurance authorization from [place], beginning tomorrow. Reports that she has called patient's wife to inform on admission forms that need to be completed. Pt going to Rm #[id number]; Nurse report [phone].   SW spoke with pt wifeJaclyn ([phone]) to inform on above. Reports concerns related to pt son being arranged to see him tomorrow afternoon, and not having access to print admission documents. SW spoke with Tammy/Admissions who reported that she will receive a text link to electronically complete. SW provided pt wife with updates. Pt wife still upset about short notice with transition to SNF. SW reviewed previous discussion on SNF placement process.   SW spoke with [facility] for pick up at 1:30pm tomorrow (4/28).  SW returned phone call to pt mother [name] [phone]) who expressed concerns about d/c and pt not being able to see his son before d/c. SW explained process with insurance and pt being approved for placement. SW reviewed previous discussion on SNF placement process.   SW returned phone call/left message to [name] [phone] ext [phone]/f:[phone])to confirm pt will d/c tomorrow.   *SW met with pt in room to discuss above. SW discussed alternative options for him to see his son tomorrow.    Loralee Pacas, MSW, [place] Office: [phone] Cell: [phone] Fax: [phone]

## 2019-12-03 NOTE — Plan of Care (Signed)
  Problem: Education: Goal: Required Educational Video(s) Outcome: Progressing   Problem: Consults Goal: RH GENERAL PATIENT EDUCATION Description: See Patient Education module for education specifics. Outcome: Progressing Goal: Skin Care Protocol Initiated - if Braden Score 18 or less Description: If consults are not indicated, leave blank or document N/A Outcome: Progressing   Problem: RH BOWEL ELIMINATION Goal: RH STG MANAGE BOWEL WITH ASSISTANCE Description: STG Manage Bowel with mod Assistance. Outcome: Progressing   Problem: RH BLADDER ELIMINATION Goal: RH STG MANAGE BLADDER WITH ASSISTANCE Description: STG Manage Bladder With mod Assistance Outcome: Progressing   Problem: RH SKIN INTEGRITY Goal: RH STG SKIN FREE OF INFECTION/BREAKDOWN Description: Skin will be free of infection/breakdown with mod assistance Outcome: Progressing Goal: RH STG MAINTAIN SKIN INTEGRITY WITH ASSISTANCE Description: STG Maintain Skin Integrity With mod Assistance. Outcome: Progressing Goal: RH STG ABLE TO PERFORM INCISION/WOUND CARE W/ASSISTANCE Description: STG Able To Perform Incision/Wound Care With total Assistance. Outcome: Progressing   Problem: RH SAFETY Goal: RH STG ADHERE TO SAFETY PRECAUTIONS W/ASSISTANCE/DEVICE Description: STG Adhere to Safety Precautions With min Assistance/Device. Outcome: Progressing Goal: RH STG DECREASED RISK OF FALL WITH ASSISTANCE Description: STG Decreased Risk of Fall With max Assistance. Outcome: Progressing   Problem: RH PAIN MANAGEMENT Goal: RH STG PAIN MANAGED AT OR BELOW PT'S PAIN GOAL Description: Pain will be less than 3 out of 10 on pain scale. Pt will be able to verbalize when in pain  Outcome: Progressing

## 2019-12-03 NOTE — Progress Notes (Signed)
[facility] PHYSICAL MEDICINE & REHABILITATION PROGRESS NOTE   Subjective/Complaints:  Pt reporting he's just wishing he could stay longer. Explained insurance is the reason I'm not able to keep him longer- we would, if able.   LBM last night ~ 9pm.  Ate breakfast well- still eating all his meals.  Still having "normal" aches and pains- but meds are helpful.    ROS:   Pt denies SOB, abd pain, CP, N/V/C/D, and vision changes    Objective:   No results found. Recent Labs    12/02/19 0628  WBC 8.4  HGB 13.2  HCT 42.7  PLT 178   Recent Labs    12/02/19 0628  NA 137  K 4.4  CL 98  CO2 29  GLUCOSE 98  BUN 33*  CREATININE 0.93  CALCIUM 9.7    Intake/Output Summary (Last 24 hours) at 12/03/2019 0850 Last data filed at 12/03/2019 0330 Gross per 24 hour  Intake -  Output 1350 ml  Net -1350 ml     Physical Exam: Vital Signs Blood pressure 122/69, pulse 87, temperature 98.2 F (36.8 C), temperature source Oral, resp. rate 16, weight 123.5 kg, SpO2 92 %.   General: awake, sitting up in bed; appropriate, finished all breakfast, NAD Sad affect this AM Heart:RRR Lungs: CTA B/L- good air movement- off O2 Abdomen: Soft, NT, ND, (+)BS  Extremities: No clubbing, cyanosis, or edema Musculoskeletal:     Cervical back: Normal range of motion and neck supple.     Comments: RUE- delt- 2/5, bicep 2/5, tricep 3-/5, WE 0/5, grip 3-/5, finger 0/5  LUE- deltoid 2/5, biceps 2 to 2-/5, triceps 4-/5, E 3/5, grip 3/5, finger 1/5 LEs B/L- HF 2-/5, KE and KF 2/5, DF 1/5; PF 2/5 Neurological: Ox3  CN exam intact. Reasonable insight and awareness. -   Skin:   Erythema scratch marks L>R calf no open areas Feet still have thick eschar on 1/3 of foot B/L- 3-4 mm thick- black eschar- no change/reassessed today   Assessment/Plan: 1. 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  Physiatrist is providing close team supervision and 24 hour management of active medical problems listed below.  Physiatrist and rehab team continue to assess barriers to discharge/monitor patient progress toward functional and medical goals  Care Tool:  Bathing    Body parts bathed by patient: Abdomen, Front perineal area, Chest, Right arm   Body parts bathed by helper: Left arm, Buttocks, Right upper leg, Left upper leg, Right lower leg, Left lower leg, Face     Bathing assist Assist Level: Maximal Assistance - Patient 24 - 49%     Upper Body Dressing/Undressing Upper body dressing   What is the patient wearing?: Pull over shirt    Upper body assist Assist Level: Maximal Assistance - Patient 25 - 49%    Lower Body Dressing/Undressing Lower body dressing      What is the patient wearing?: Pants     Lower body assist Assist for lower body dressing: Maximal Assistance - Patient 25 - 49%     Toileting Toileting    Toileting assist Assist for toileting: Total Assistance - Patient < 25%     Transfers Chair/bed transfer  Transfers assist     Chair/bed transfer assist level: Maximal Assistance - Patient 25 - 49% Chair/bed transfer assistive device: Sliding board   Locomotion Ambulation   Ambulation assist   Ambulation activity did  not occur: Safety/medical concerns          Walk 10 feet activity   Assist  Walk 10 feet activity did not occur: Safety/medical concerns        Walk 50 feet activity   Assist Walk 50 feet with 2 turns activity did not occur: Safety/medical concerns         Walk 150 feet activity   Assist Walk 150 feet activity did not occur: Safety/medical concerns         Walk 10 feet on uneven surface  activity   Assist Walk 10 feet on uneven surfaces activity did not occur: Safety/medical concerns         Wheelchair     Assist Will patient use wheelchair at discharge?: Yes Type of Wheelchair:  Manual    Wheelchair assist level: Minimal Assistance - Patient > 75% Max wheelchair distance: 50(w/mult rest breaks)    Wheelchair 50 feet with 2 turns activity    Assist    Wheelchair 50 feet with 2 turns activity did not occur: Safety/medical concerns(unable to turn wc)       Wheelchair 150 feet activity     Assist  Wheelchair 150 feet activity did not occur: Safety/medical concerns       Blood pressure 122/69, pulse 87, temperature 98.2 F (36.8 C), temperature source Oral, resp. rate 16, weight 123.5 kg, SpO2 92 %.  Medical Problem List and Plan: 1.  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   4/21- doing better- can now feed self- con't therapy  4/22- sw SENT SNF referral  4/23- per SW, pt has to leave soon or they won't pay more for rehab- concerning since pt making so MUCH gains in last 4 days to lessen burden of care.   4/27- pt asking if there's anyway to stay longer since making so many gains- explained will check with insurance, but don't think likely.              -ELOS/Goals: 3-4 weeks- goals max assist- severely impaired  -Continue CIR PT, OT, SLP 2.  Antithrombotics: -DVT/anticoagulation: Lovenox 60 mg daily.  Vascular study negative             -antiplatelet therapy: N/A 3. Pain Management: Dilaudid 2 mg every 6 hours as needed pain 4. Mood: Melatonin 6 mg nightly, trazodone 100 mg nightly, Klonopin 1 mg nightly, Lexapro 20 mg daily, Valium 2.5 mg every 12 hours as needed anxiety  4/9- will try to get Neuropsych for pt due to anxiety.    4/10: Did not use valium today  4/12- neuropsych on schedule per SW  4/16- saw Neuropsych today- discussed SNF planning.  4/27- much less anxious- tolerating therapy well              -antipsychotic agents: N/A 5. Neuropsych: This patient is capable of making decisions on his own behalf. 6. Skin/Wound/ Care/deep tissue pressure injury bilateral heels and lateral feet: Wound care as directed  4/11: Ordered  betadine dressings for bilateral feet as he was receiving in acute care.   4/14- consulted [facility]: they asked for debridement- consulted Dr Sharol Given. .      4/23- will need to see Dr Sharol Given outpatient for debridement after he leaves for SNF. Pruritis L>R calf trial sarna lotion , ?irritation from prevalon boots   7. Fluids/Electrolytes/Nutrition: Strict INO's with follow-up chemistries  4/9- labs look good except BUN still 46- is dry? But is stable/better than was 1 week ago- was 51.  4/12- will recheck labs in AM   4/13- BUN down to 40- doing better- con't to monitor weekly.  4/19. I personally reviewed the patient's labs today.   Labs stable, BUN still 40   -discussed nutrition  4/26- BUN down to 33- doing better  with patient today, encourage PO fluids 8.  Dysphagia.  Status post PEG tube 09/11/2019 per Dr. Worthy Keeler.  Diet has been advanced to regular.  Patient is still receiving nocturnal tube feeds by PEG tube.  4/9- doing calorie count, to see if can stop TFs  4/13- cannot stop TFs per dietician, since pt has high protein/calorie needs to aid healing, and is Dependent on staff to feed him everything.   4/20- asking dietician to move TFs to finish earlier so hungry for breakfast.   4/21- changed TFs from 6pm to 4am- 10 hours.   4/23- changed Prostat to [place]  4/26- tolerating well- con't regimen 9.  Status post tracheostomy 08/06/2019.  Decannulated 11/08/2019. 10.  Hypertension.  Norvasc 10 mg daily, Coreg 25 mg twice daily, Aldactone 25 mg daily.  Monitor with increased  mobility  4/9-4/10 BP well controlled- continue regimen. Addendum: Hypotensive to 106/49 at night; decreased amlodipine dose to 54m.   4/17- BP 99/52 this AM- will decrease norvasc to 2.5 mg daily.  4/26- BP 102/50- a little low, but overall, doing better  4/27- BP 122/69- much better- no orthostasis Sx's.  11.  History of asthma.  Continue nebulizers as directed  4/17- sats 99%- wearing 1L O2- will d/w nursing again- I think it can come off, unless nursing can show he needs it- his sats are 95-99% on 1L. Wrote order to wean, again.  4/23- off O2- tolerating well 12.  GERD.  Protonix 13.  BPH.  Flomax 0.4 mg daily. 14. Morbid obesity- current BMI of 40- will discuss diet, etc once more appropriate.  4/15- BMI down to 38.8- improving   4/23- weighed again- BMI up to 41.2- going in wrong direction, but has lost so much weight since here/he' was malnourished, so likely needed 165 Insomnia- con't meds as above  4/9-4/10 sleeps fine- is his normal amount of sleep  4/12- doesn't sleep well, but is chronic- on Klonopin AND trazodone 100 mg QHS  4/20- added benadryl to QHS prn 25 mg so if wakes up at night, can have benadryl  4/22- will take more metamucil so can try and avoid BM in middle of night.  4/27- doing better- no BM at night in 4 days.   16. Right ear wax: Debrox ear drops  4/20- stopped drops 17. Nausea/loose stool  4/18- will schedule Zofran at 0600 am to help with AM nausea.  4/19 already on fiber. Will add florastor as well to improve stool consistency  4/20- suggest benadryl if occurring in middle of night-ordering.   4/22- changed Prostat to [place] per pt request- take more metamucil   L[place]: 19 days [place]  12/03/2019, 8:50 AM

## 2019-12-04 ENCOUNTER — Inpatient Hospital Stay (HOSPITAL_COMMUNITY): Payer: Managed Care, Other (non HMO)

## 2019-12-04 ENCOUNTER — Inpatient Hospital Stay (HOSPITAL_COMMUNITY): Payer: Managed Care, Other (non HMO) | Admitting: Occupational Therapy

## 2019-12-04 LAB — GLUCOSE, CAPILLARY
Glucose-Capillary: 86 mg/dL (ref 70–99)
Glucose-Capillary: 93 mg/dL (ref 70–99)

## 2019-12-04 MED ORDER — TRAZODONE HCL 100 MG PO TABS: 100.0000 mg | ORAL_TABLET | Freq: Every day | ORAL | 0 refills | Status: DC

## 2019-12-04 MED ORDER — MELATONIN 3 MG PO TABS: 6.0000 mg | ORAL_TABLET | Freq: Every day | ORAL | 0 refills | Status: DC

## 2019-12-04 MED ORDER — DIAZEPAM 5 MG PO TABS: 2.5000 mg | ORAL_TABLET | Freq: Two times a day (BID) | ORAL | 0 refills | Status: DC | PRN

## 2019-12-04 MED ORDER — HYDROMORPHONE HCL 2 MG PO TABS: 2.0000 mg | ORAL_TABLET | Freq: Four times a day (QID) | ORAL | 0 refills | Status: DC | PRN

## 2019-12-04 MED ORDER — SACCHAROMYCES BOULARDII 250 MG PO CAPS: 250.0000 mg | ORAL_CAPSULE | Freq: Two times a day (BID) | ORAL | Status: DC

## 2019-12-04 MED ORDER — ESCITALOPRAM OXALATE 20 MG PO TABS: 20.0000 mg | ORAL_TABLET | Freq: Every day | ORAL | Status: DC

## 2019-12-04 MED ORDER — CLONAZEPAM 1 MG PO TBDP: 1.0000 mg | ORAL_TABLET | Freq: Every day | ORAL | 0 refills | Status: DC

## 2019-12-04 MED ORDER — AMLODIPINE BESYLATE 2.5 MG PO TABS: 2.5000 mg | ORAL_TABLET | Freq: Every day | ORAL | Status: DC

## 2019-12-04 NOTE — Plan of Care (Signed)
  Problem: Education: Goal: Required Educational Video(s) Outcome: Progressing   Problem: Consults Goal: RH GENERAL PATIENT EDUCATION Description: See Patient Education module for education specifics. Outcome: Progressing Goal: Skin Care Protocol Initiated - if Braden Score 18 or less Description: If consults are not indicated, leave blank or document N/A Outcome: Progressing   Problem: RH BOWEL ELIMINATION Goal: RH STG MANAGE BOWEL WITH ASSISTANCE Description: STG Manage Bowel with mod Assistance. Outcome: Progressing   Problem: RH BLADDER ELIMINATION Goal: RH STG MANAGE BLADDER WITH ASSISTANCE Description: STG Manage Bladder With mod Assistance Outcome: Progressing   Problem: RH SKIN INTEGRITY Goal: RH STG SKIN FREE OF INFECTION/BREAKDOWN Description: Skin will be free of infection/breakdown with mod assistance Outcome: Progressing Goal: RH STG MAINTAIN SKIN INTEGRITY WITH ASSISTANCE Description: STG Maintain Skin Integrity With mod Assistance. Outcome: Progressing Goal: RH STG ABLE TO PERFORM INCISION/WOUND CARE W/ASSISTANCE Description: STG Able To Perform Incision/Wound Care With total Assistance. Outcome: Progressing   Problem: RH SAFETY Goal: RH STG ADHERE TO SAFETY PRECAUTIONS W/ASSISTANCE/DEVICE Description: STG Adhere to Safety Precautions With min Assistance/Device. Outcome: Progressing Goal: RH STG DECREASED RISK OF FALL WITH ASSISTANCE Description: STG Decreased Risk of Fall With max Assistance. Outcome: Progressing   Problem: RH PAIN MANAGEMENT Goal: RH STG PAIN MANAGED AT OR BELOW PT'S PAIN GOAL Description: Pain will be less than 3 out of 10 on pain scale. Pt will be able to verbalize when in pain  Outcome: Progressing

## 2019-12-04 NOTE — Progress Notes (Signed)
Social Work Discharge Note   The overall goal for the admission was met for:   Discharge location: Yes. Discharge to SNF- [facility]  Length of Stay: Yes. 20 days.  Discharge activity level: Yes. Mod A of 1-2 to Max A of 2.  Home/community participation: Yes. Limited.  Services provided included: MD, RD, PT, OT, RN, CM, TR, Pharmacy, Neuropsych and SW  Financial Services: Private Insurance: [employer]  Follow-up services arranged: Other: N/A- SNF  Comments (or additional information): contact pt wife [name] [phone]  Patient/Family verbalized understanding of follow-up arrangements: Yes  Individual responsible for coordination of the follow-up plan: N/A   Confirmed correct DME delivered: Rana Snare 12/04/2019    Rana Snare

## 2019-12-04 NOTE — Progress Notes (Signed)
Patient discharged off of unit with all belongings. Discharge papers/instructions explained by physician assistant to family. Patient and family have no further questions at time of discharge. No complications noted at this time.  [place]

## 2019-12-04 NOTE — Progress Notes (Signed)
Occupational Therapy Session Note  Patient Details  Name: [name] MRN: [id number] Date of Birth: September 26, 1975  Today's Date: 12/04/2019 OT Individual Time: 1100-1145 OT Individual Time Calculation (min): 45 min    Short Term Goals: Week 3:  OT Short Term Goal 1 (Week 3): Patient will complete rolling mod I with rails OT Short Term Goal 2 (Week 3): patient will complete supine to sitting edge of bed with min A OT Short Term Goal 3 (Week 3): patient will complete grooming tasks min a with UB support OT Short Term Goal 4 (Week 3): Patient will complete UB dressing with min A Week 4:     Skilled Therapeutic Interventions/Progress Updates:    1:1 Pt in w/c when arrived and feeling down about leaving today for SNF. Also c/o left buttock discomfort in chair. Offered different therapeutic activities including standing in the STEDY, kineotron in sitting from w/c bathing and dressing and tilt table. Pt choose to try tilt table with working on UB as able. Pt was lifted to table due to height. Pt could only tolerate 15 degrees before pain in LEs (calfs and hamstrings) were too great despite attempts to reposition. Pt requested to stop and return to chair. Pt then performed slide board transfer into bed (uphill) with max A +2 due to fatigue. Reviewed importance of LE postioning and being an advocate for himself. Left pt resting in bed setup for lunch prior to d/c to SNF.   Therapy Documentation Precautions:  Precautions Precautions: Fall Precaution Comments: bilat wounds/feet, PEG Required Braces or Orthoses: Other Brace Other Brace: B prevalon boots Restrictions Weight Bearing Restrictions: No RLE Weight Bearing: Weight bearing as tolerated LLE Weight Bearing: Weight bearing as tolerated Other Position/Activity Restrictions: to be clarified due to bilat wounds plantar and lateral aspect of feet General: General OT Amount of Missed Time: 15 Minutes due to fatigue    Pain: Bilateral LE  pain and left buttock discomfort in session today. Pt reports UEs are so tired today.   Therapy/Group: Individual Therapy  [facility] 12/04/2019, 12:07 PM

## 2019-12-04 NOTE — Progress Notes (Signed)
Physical Therapy Session Note  Patient Details  Name: [name] MRN: [id number] Date of Birth: 1976/05/11  Today's Date: 12/04/2019 PT Individual Time: 0904-1020 PT Individual Time Calculation (min): 76 min   Short Term Goals: Week 3:  PT Short Term Goal 1 (Week 3): Patietn will perform bed mobility with CGA with use of bed features. PT Short Term Goal 2 (Week 3): Patient will perform bed>chair transfers with max A of 1 person. PT Short Term Goal 3 (Week 3): Patient will tolerate standing in Kreg bed >50 degrees x5 min for increase LE weight bearing and vertical tolerance.  Skilled Therapeutic Interventions/Progress Updates:     Patient in bed upon PT arrival. Patient alert and agreeable to PT session. Patient reported 3-4/10 B LE and UE muscle soreness during session, RN made aware. PT provided repositioning, rest breaks, and distraction as pain interventions throughout session.   Therapeutic Activity: Patient requested to use the urinal prior to dressing in the bed. Required total A for urinal placement. Patient was continent of bladder with use of urinal. PT donned 1-4"and 1-6" ACE wrap on B LEs for OH managment and B tennis shoes with total A for time management and energy conservation.  Bed Mobility: Patient performed rolling R and L x2 to don shorts with min A-supervision with use of bed rails in a flat bed. Patient able to bend up each LE to push independently today. Donned shorts with max A with patient assisting with threading LEs by lifting his legs and assisting to reach around to pull up his shorts. He performed supine to/from sit performing rolling to L side lying between with mod A for trunk support and min A for LE managment. Provided verbal cues for rolling technique, setting bottom elbow to push up, and bringing knees to chest then kicking feet out to take legs off the bed. Patient doffed a hospital gown and donned a t-shirt with max A seated EOB. Transfers: Patient  performed a slightly downhill slide board transfer bed>standard w/c with max A of 1 person and total A of 1 person for board placement. Provided cues for hand placement, board placement, and head-hips relationship for proper technique and decreased assist with transfers. Placed 3" step beneath his feet for increased LE weight bearing to assist with pushing through LE's during transfer.  Doffed patient's tennis shoes and donned Prevalon boots with total A for increased protection of plantar surfaces of his feet and comfort while seated in the w/c.  Patient expressed that he is very disappointed to be leaving CIR today to go to SNF. PT provided encouragement and education about advocating for himself, getting OOB at least 1x per day, setting standing and gait goals with therapists, and discussed patient's progress since admission to CIR to motivate continued progress at SNF. Patient very appreciative and receptive throughout discussion. Provided bed level HEP for exercises that he can perform independently around therapy sessions due to reduced intensity.   Patient in w/c at end of session with breaks locked and all needs within reach.    Therapy Documentation Precautions:  Precautions Precautions: Fall Precaution Comments: bilat wounds/feet, PEG Required Braces or Orthoses: Other Brace Other Brace: B prevalon boots Restrictions Weight Bearing Restrictions: No RLE Weight Bearing: Weight bearing as tolerated LLE Weight Bearing: Weight bearing as tolerated    Therapy/Group: Individual Therapy  Rogerio Boutelle L Liane Tribbey PT, DPT  12/04/2019, 5:15 PM

## 2019-12-04 NOTE — Progress Notes (Addendum)
Physical Therapy Discharge Summary  Patient Details  Name: [name] MRN: [id number] Date of Birth: [birth year]  Today's Date: 12/04/2019   Patient has met 1 of 9 long term goals due to improved activity tolerance, improved balance, improved postural control, increased strength, increased range of motion, decreased pain, ability to compensate for deficits and improved coordination.  Patient to discharge at a wheelchair level Max Assist.   Patient's care partner unable to provide the necessary physical assistance at discharge. Patient to d/c to SNF to improve functional mobility prior to going home.  Reasons goals not met: Patient with prolonged hospital stay, admitted on 07/21/2020, including long term ventilation, resulting in significant strength, activity tolerance, and functional mobility deficits. He had decreased activity tolerance the first week of rehab limiting progress with mobility, however, started showing tremendous progress, tolerating 3 hours of therapy and progressing from total A +2 to max A +1 for transfers, in his second and third weeks. Patient was unable to reach a level of care that his family can provide with rehab time allowed. Plan to continue with therapy at SNF for increased independence with mobility prior to returning home.  Recommendation:  Patient will benefit from ongoing skilled PT services in skilled nursing facility setting to continue to advance safe functional mobility, address ongoing impairments in strength, activity tolerance, functional mobility, w/c mobility, standing tolerance, balance, patient/caregiver education, and minimize fall risk.  Equipment: No equipment provided, to be provided by SNF  Reasons for discharge: discharge from hospital to SNF  Patient/family agrees with progress made and goals achieved: Yes  PT Discharge Precautions/Restrictions Precautions Precautions: Fall Precaution Comments: bilat wounds/feet, PEG Other Brace: B  prevalon boots Restrictions Weight Bearing Restrictions: Yes RLE Weight Bearing: Weight bearing as tolerated LLE Weight Bearing: Weight bearing as tolerated Vision/Perception  Perception Perception: Within Functional Limits Praxis Praxis: Intact  Cognition Overall Cognitive Status: Within Functional Limits for tasks assessed Arousal/Alertness: Awake/alert Attention: Focused;Sustained Focused Attention: Appears intact Sustained Attention: Appears intact Memory: Appears intact Awareness: Appears intact Problem Solving: Appears intact Safety/Judgment: Appears intact Sensation Sensation Light Touch: Impaired Detail Peripheral sensation comments: decreased light touch sensation B thumbs and B thighs, patient reports ongoing during hospital stay Light Touch Impaired Details: Impaired RUE;Impaired LUE;Impaired RLE;Impaired LLE Proprioception: Appears Intact Additional Comments: Decreased sensation of B thumbs, L lateral calf, and R medial calf Coordination Gross Motor Movements are Fluid and Coordinated: No Fine Motor Movements are Fluid and Coordinated: No Coordination and Movement Description: Generalized deconditioning resulting in decreased strength and activity tolerance due to prolonged hospital stay. Significant progress since admission to CIR. Heel Shin Test: Unable due to LE weakness Motor  Motor Motor: Other (comment) Motor - Skilled Clinical Observations: Continued generalized deconditioning and weakness Motor - Discharge Observations: Improved functional mobility from total A +2 for all mobility to max A +1 for functional SBT, min-mod A for bed mobility, and initiated standing in Stedy  Mobility Bed Mobility Bed Mobility: Supine to Sit;Sit to Supine Rolling Right: Supervision/verbal cueing(with use of bed rail) Rolling Left: Minimal Assistance - Patient > 75%(with use of bed rail) Right Sidelying to Sit: Moderate Assistance - Patient 50-74%(can perform with min A  intermittently) Left Sidelying to Sit: Moderate Assistance - Patient 50-74%(can perform with min A intermittently) Sit to Sidelying Left: Moderate Assistance - Patient 50-74% Scooting to HOB: Maximal Assistance - Patient 25-49%(+1 assist with bed in full [place], +2 assist with bed flat) Transfers Transfers: Lateral/Scoot Transfers Lateral/Scoot Transfers: Maximal Assistance - Patient 25-49%(May need  second person to assist with fatigue) Transfer (Assistive device): Other (Comment)(slide board) Transfer via Lift Equipment: Stedy;Maximove(max A +2 in [place]) Locomotion  Gait Ambulation: No Gait Gait: No Stairs / Additional Locomotion Stairs: No Architect: Yes Wheelchair Assistance: Chartered loss adjuster: Both upper extremities;Both lower extermities Wheelchair Parts Management: Needs assistance Distance: 30 ft  Trunk/Postural Assessment  Cervical Assessment Cervical Assessment: Exceptions to WFL(generalized decreased ROM d/t immobility) Thoracic Assessment Thoracic Assessment: Exceptions to WFL(see above) Lumbar Assessment Lumbar Assessment: Exceptions to WFL(see above) Postural Control Postural Control: Deficits on evaluation Postural Limitations: proximal weakness, rounded shoulders  Balance Balance Balance Assessed: Yes Static Sitting Balance Static Sitting - Level of Assistance: 5: Stand by assistance Dynamic Sitting Balance Dynamic Sitting - Level of Assistance: 5: Stand by assistance Sitting balance - Comments: able to sit on edge of bed w/feet supported  performing ADL tasks with no UE support, requires assist w/sitting balance w/transfers due to weakness, sits unsupported in wc without difficulty Extremity Assessment  RUE Assessment Passive Range of Motion (PROM) Comments: shoulder flex/abd 100, ER 45, distal WFL Active Range of Motion (AROM) Comments: less than 10% against gravity shoulder flex/abd when  unsupported, less than 10% elbow flex, el ext full, wrist 1/2 against gravity, weak grasp General Strength Comments: able to hold items with right hand, able to push through full arm to assist with transfers LUE Assessment LUE Assessment: Exceptions to [facility] Passive Range of Motion (PROM) Comments: shoulder flex/abd 120, ER 60, distal WFL Active Range of Motion (AROM) Comments: shoulder flex/abd in supported position of bed 3/4 (without support 1/4), elbow flex full ( for one rep - fatigues quickly)  elbow ext full, wrist/hand full General Strength Comments: able to feed himself and use phone with proximal support, min A for hand to mouth in unsupported sitting. RLE Assessment RLE Assessment: Exceptions to [facility] Passive Range of Motion (PROM) Comments: lacks approx 5-10* DF R ankle, knee flexion to 90 w/pain limited by muscle tightness, ER at the hip in supine General Strength Comments: global weakness grossly 3-/5 hip flexion, 2+ knee extension, 3+/5 knee flexion, 2-/5 ankle DF, 3+ PF LLE Assessment Passive Range of Motion (PROM) Comments: lacks approx 5-10* DF R ankle, knee flexion to 90 w/pain limited by muscle tightness, ER at the hip with foot supination in supine General Strength Comments: global weakness grossly 4-/5 hip flexion, 4-/5 knee extension, 4/5 knee flexion, 3/5 ankle DF, 4/5 PF    Taneisha Fuson L Brondon Wann PT, DPT  12/04/2019, 3:45 PM

## 2019-12-04 NOTE — Discharge Summary (Signed)
Physician Discharge Summary  Patient ID: [name] MRN: [id number] DOB/AGE: 1976-07-10 44 y.o.  Admit date: 11/14/2019 Discharge date: 12/04/2019  Discharge Diagnoses:  Principal Problem:   Critical illness myopathy Active Problems:   Eschar of foot DVT prophylaxis Dysphagia/PEG tube Tracheostomy/decannulated Hypertension History of asthma GERD Morbid obesity BPH  Discharged Condition: Stable  Significant Diagnostic Studies: No results found.  Labs:  Basic Metabolic Panel: Recent Labs  Lab 11/28/19 0410 12/02/19 0628  NA  --  137  K  --  4.4  CL  --  98  CO2  --  29  GLUCOSE  --  98  BUN  --  33*  CREATININE 0.89 0.93  CALCIUM  --  9.7    CBC: Recent Labs  Lab 12/02/19 0628  WBC 8.4  NEUTROABS 5.2  HGB 13.2  HCT 42.7  MCV 87.7  PLT 178    CBG: Recent Labs  Lab 12/03/19 0603 12/03/19 1132 12/03/19 1621 12/03/19 2048 12/04/19 0559  GLUCAP 99 86 78 130* 86   Family history.  Brother with asthma Father with diabetes and hypertension.  Paternal grandmother with diabetes.  Denies any esophageal cancer rectal cancer  Brief HPI:   [name] is a 44 y.o. right-handed male with history of asthma, hypertension and pancreatitis.  Lives with spouse independent prior to admission.  Initially presented 07/22/2019 with Covid pneumonia causing acute respiratory failure.  He developed pneumomediastinum 12/23 with concern for possible bilateral small pneumothoraces 08/01/2019.  Placed on 10-day course of Decadron as well as remdesivir and tociluzimab.  Patient required intubation later underwent bilateral chest tube placements.  TEE 08/03/2019 ejection fraction 70% without emboli.  Long-term intubation required tracheostomy 08/06/2019 that was downsized and decannulated 11/08/2019.  Gastrostomy tube placement 09/11/2019 per Dr. Thurston Pounds was inserted for nutritional support.  He did require long-term broad-spectrum antibiotics.  Hospital course complicated by  diffuse muscle weakness suspect critical illness myopathy follow neurology services.  Palliative care consulted to establish goals of care.  His diet was slowly advanced to regular consistency still receiving nocturnal tube feeds for nutritional support.  Subcutaneous Lovenox for DVT prophylaxis.  Patient with deep tissue pressure injuries bilateral heels and lateral feet followed by wound care nurse dressing changes as directed.  Therapy evaluations completed and patient was admitted for a comprehensive rehab program.   Hospital Course: JOAKIM HUESMAN was admitted to rehab 11/14/2019 for inpatient therapies to consist of PT, ST and OT at least three hours five days a week. Past admission physiatrist, therapy team and rehab RN have worked together to provide customized collaborative inpatient rehab.  In regards to patient's critical illness myopathy after acute hypoxic hypercarbic respiratory failure due to Covid pneumonia he was attending therapies with slow progressive gains.  Maintained on Lovenox for DVT prophylaxis vascular studies negative.  Pain managed use of Dilaudid as needed as well as sleeping aids with some Valium for anxiety.  Skin care noted eschar bilateral feet seen by orthopedic service Dr. Sharol Given plan for outpatient debridement as needed ongoing skin care as directed.  His diet has been advanced to regular he had a PEG tube placed 09/11/2019 being used as needed for nocturnal tube feeds he can follow-up with Dr. Thurston Pounds.  Initial tracheostomy tube 08/06/2019 decannulated 11/08/2019 oxygen saturations greater than 90%.  Blood pressures controlled no orthostasis Norvasc Coreg Aldactone as advised with close monitoring for any orthostatic changes.  Morbid obesity BMI 38.8 dietary follow-up.   Blood pressures were monitored on  TID basis and controlled   He/ has made gains during rehab stay and is attending therapies  He/ will continue to receive follow up therapies   after  discharge  Rehab course: During patient's stay in rehab weekly team conferences were held to monitor patient's progress, set goals and discuss barriers to discharge. At admission, patient required +2 physical assist sit to stand moderate assist for rolling.  Max total assist ADLs.  Physical exam.  Blood pressure 104/55 pulse 84 temperature 98 respirations 16 oxygen saturation 97% room air Constitutional alert and oriented HEENT Head.  Normocephalic and atraumatic Eyes.  Pupils round and reactive to light no discharge without nystagmus Neck.  Supple nontender no JVD without thyromegaly Cardiac regular rate rhythm without any extra sounds or murmur heard Respiratory no stridor clear to auscultation good inspiratory effort Abdomen.  Soft nontender PEG tube in place positive bowel sounds Neurological.  Patient alert and oriented. Right upper extremity deltoids 2 -/5 biceps 4 -/5 triceps 3/5 wrist extension 3/5 grip 3/5 finger 2/5 Left upper extremity deltoids 2 -/5 biceps 2/5 triceps 3/5 wrist extension 1/5 grip 2+/5 Lower extremities bilateral hip flexors 2 -/5 knee extension and flexion 2/5 dorsi flexion 1/5 plantar flexion 2/5  He/She  has had improvement in activity tolerance, balance, postural control as well as ability to compensate for deficits. He/She has had improvement in functional use RUE/LUE  and RLE/LLE as well as improvement in awareness.  Working with energy conservation techniques.  Patient needed some encouragement to participate with therapies.  Perform sit to supine moderate assist of 2 due to fatigue.  Perform sit to stand in the steady with max assist.  He can tolerate sitting edge of bed 8 to 10 minutes utilizing lower extremities to stabilize for weightbearing.  Lower body bathing and dressing completed bed level working on reaching feet and long sit position and clothing management.  Dependent for shoes.  Upper body bathing and dressing completed edge of bed with max  assist.       Disposition: Discharge disposition: 03-Skilled Nursing Facility        Diet: Regular and supplement with tube feeds as needed.    Special Instructions: No smoking or alcohol  Patient should follow-up outpatient Dr. Sharol Given orthopedic services for debridement of eschar bilateral feet if needed.  Medications at discharge 1.  Tylenol as needed 2.  Norvasc 2.5 mg p.o. daily 3.  Vitamin C 500 mg p.o. daily 4.  Coreg 25 mg p.o. twice daily 5.  Klonopin 1 mg p.o. nightly 6.  Valium 2.5 mg every 12 hours as needed anxiety 7.  Lexapro 20 mg p.o. daily 8.  Dilaudid 2 mg every 6 hours as needed moderate pain 9.  Zyrtec 10 mg daily 10.  Singulair 10 mg p.o. nightly 11.  Melatonin 6 mg p.o. nightly 12.  Protonix 40 mg p.o. daily 13.  FiberCon 625 mg p.o. daily 14.  Florastor 250 mg p.o. twice daily 15.  Aldactone 25 mg p.o. daily 16.  Flomax 0.4 mg p.o. daily 17.  Trazodone 100 mg p.o. nightly 18.  Symbicort 2 puffs daily as needed 19.  Nasacort allergy inhaler 2 puffs daily  30 to 35 minutes were spent completing  discharge summary and discharge planning  Discharge Instructions    Ambulatory referral to Neurology   Complete by: As directed    An appointment is requested in approximately follow up 4 weeks critical illness      Follow-up Information    Lovorn,  Jinny Blossom, MD Follow up.   Specialty: Physical Medicine and Rehabilitation Why: Office to call for appointment Contact information: [address] [place] [address] [phone]        Wonda Olds, MD Follow up.   Specialty: Cardiothoracic Surgery Why: Call for appointment Contact information: [place] [address] [phone]        Bensimhon, Shaune Pascal, MD Follow up.   Specialty: Cardiology Why: Call for appointment Contact information: [address] [place] [address] [phone]        Newt Minion, MD Follow  up in 4 week(s).   Specialty: Orthopedic Surgery Why: call for appointment Contact information: [address] [place] [address] [phone]           Signed: Cathlyn Parsons 12/04/2019, 7:22 AM

## 2019-12-04 NOTE — Plan of Care (Signed)
  Problem: Education: Goal: Required Educational Video(s) Outcome: Completed/Met   Problem: Consults Goal: RH GENERAL PATIENT EDUCATION Description: See Patient Education module for education specifics. Outcome: Completed/Met Goal: Skin Care Protocol Initiated - if Braden Score 18 or less Description: If consults are not indicated, leave blank or document N/A Outcome: Completed/Met   Problem: RH BOWEL ELIMINATION Goal: RH STG MANAGE BOWEL WITH ASSISTANCE Description: STG Manage Bowel with mod Assistance. Outcome: Completed/Met   Problem: RH BLADDER ELIMINATION Goal: RH STG MANAGE BLADDER WITH ASSISTANCE Description: STG Manage Bladder With mod Assistance Outcome: Completed/Met   Problem: RH SKIN INTEGRITY Goal: RH STG SKIN FREE OF INFECTION/BREAKDOWN Description: Skin will be free of infection/breakdown with mod assistance Outcome: Completed/Met Goal: RH STG MAINTAIN SKIN INTEGRITY WITH ASSISTANCE Description: STG Maintain Skin Integrity With mod Assistance. Outcome: Completed/Met Goal: RH STG ABLE TO PERFORM INCISION/WOUND CARE W/ASSISTANCE Description: STG Able To Perform Incision/Wound Care With total Assistance. Outcome: Completed/Met   Problem: RH SAFETY Goal: RH STG ADHERE TO SAFETY PRECAUTIONS W/ASSISTANCE/DEVICE Description: STG Adhere to Safety Precautions With min Assistance/Device. Outcome: Completed/Met Goal: RH STG DECREASED RISK OF FALL WITH ASSISTANCE Description: STG Decreased Risk of Fall With max Assistance. Outcome: Completed/Met   Problem: RH PAIN MANAGEMENT Goal: RH STG PAIN MANAGED AT OR BELOW PT'S PAIN GOAL Description: Pain will be less than 3 out of 10 on pain scale. Pt will be able to verbalize when in pain  Outcome: Completed/Met

## 2019-12-04 NOTE — Progress Notes (Signed)
Social Work Patient ID: [name], male   DOB: 12/24/1975, 44 y.o.   MRN: [id number]    SW returned phone call to [name] who indicated there son would be coming to visit today. SW added pt son [name] to visitor list today.  SW provided Rm # and nurse report to pt assigned RN. D/c packet left at front for ambulance pick up at 1:30pm.   Loralee Pacas, MSW, [name] Office: [phone] Cell: [phone] Fax: [phone]

## 2019-12-04 NOTE — Progress Notes (Signed)
[facility] [place] PHYSICAL MEDICINE & REHABILITATION PROGRESS NOTE   Subjective/Complaints:  Pt sad about having to leave since making so many gains.  Bowels working OK  ROS:   Pt denies SOB, abd pain, CP, N/V/C/D, and vision changes   Objective:   No results found. Recent Labs    12/02/19 0628  WBC 8.4  HGB 13.2  HCT 42.7  PLT 178   Recent Labs    12/02/19 0628  NA 137  K 4.4  CL 98  CO2 29  GLUCOSE 98  BUN 33*  CREATININE 0.93  CALCIUM 9.7    Intake/Output Summary (Last 24 hours) at 12/04/2019 0846 Last data filed at 12/04/2019 0343 Gross per 24 hour  Intake 640 ml  Output 2300 ml  Net -1660 ml     Physical Exam: Vital Signs Blood pressure 118/65, pulse 81, temperature 98.4 F (36.9 C), temperature source Oral, resp. rate 18, weight 123.5 kg, SpO2 95 %.   General: laying supine in bed; finished tray- all tray, NAD Sad affect again Heart:RRR Lungs: CTA B/L- off O2 Abdomen: Soft, NT, ND, (+)BS  Extremities: No clubbing, cyanosis, or edema Musculoskeletal:     Cervical back: Normal range of motion and neck supple.     Comments: RUE- delt- 2/5, bicep 2/5, tricep 3-/5, WE 0/5, grip 3-/5, finger 0/5  LUE- deltoid 2/5, biceps 2 to 2-/5, triceps 4-/5, E 3/5, grip 3/5, finger 1/5 LEs B/L- HF 2-/5, KE and KF 2/5, DF 1/5; PF 2/5 Neurological: Ox3  CN exam intact. Reasonable insight and awareness. -  Skin:   Erythema scratch marks L>R calf no open areas Feet still have thick eschar on 1/3 of foot B/L- 3-4 mm thick- black eschar- reassessed today- no change   Assessment/Plan: 1. 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  Physiatrist is providing close team supervision and 24 hour management of active medical problems listed below.  Physiatrist and rehab team continue to assess barriers to discharge/monitor patient progress toward functional and medical  goals  Care Tool:  Bathing    Body parts bathed by patient: Abdomen, Front perineal area, Chest, Right arm, Right upper leg, Left upper leg   Body parts bathed by helper: Left arm, Buttocks, Right upper leg, Left upper leg, Right lower leg, Left lower leg, Face     Bathing assist Assist Level: Maximal Assistance - Patient 24 - 49%     Upper Body Dressing/Undressing Upper body dressing   What is the patient wearing?: Pull over shirt    Upper body assist Assist Level: Maximal Assistance - Patient 25 - 49%    Lower Body Dressing/Undressing Lower body dressing      What is the patient wearing?: Pants     Lower body assist Assist for lower body dressing: Maximal Assistance - Patient 25 - 49%     Toileting Toileting    Toileting assist Assist for toileting: Total Assistance - Patient < 25%     Transfers Chair/bed transfer  Transfers assist     Chair/bed transfer assist level: Dependent - Patient 0%(Stedy with max +2) Chair/bed transfer assistive device: Sliding board   Locomotion Ambulation   Ambulation assist   Ambulation activity did not occur: Safety/medical concerns          Walk 10 feet activity   Assist  Walk 10 feet activity did not occur: Safety/medical concerns        Walk  50 feet activity   Assist Walk 50 feet with 2 turns activity did not occur: Safety/medical concerns         Walk 150 feet activity   Assist Walk 150 feet activity did not occur: Safety/medical concerns         Walk 10 feet on uneven surface  activity   Assist Walk 10 feet on uneven surfaces activity did not occur: Safety/medical concerns         Wheelchair     Assist Will patient use wheelchair at discharge?: Yes Type of Wheelchair: Manual    Wheelchair assist level: Minimal Assistance - Patient > 75% Max wheelchair distance: 50(w/mult rest breaks)    Wheelchair 50 feet with 2 turns activity    Assist    Wheelchair 50 feet with 2  turns activity did not occur: Safety/medical concerns(unable to turn wc)       Wheelchair 150 feet activity     Assist  Wheelchair 150 feet activity did not occur: Safety/medical concerns       Blood pressure 118/65, pulse 81, temperature 98.4 F (36.9 C), temperature source Oral, resp. rate 18, weight 123.5 kg, SpO2 95 %.  Medical Problem List and Plan: 1.  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   4/21- doing better- can now feed self- con't therapy  4/22- sw SENT SNF referral  4/23- per SW, pt has to leave soon or they won't pay more for rehab- concerning since pt making so MUCH gains in last 4 days to lessen burden of care.   4/27- pt asking if there's anyway to stay longer since making so many gains- explained will check with insurance, but don't think likely.              -ELOS/Goals: 3-4 weeks- goals max assist- severely impaired  -Continue CIR PT, OT, SLP 2.  Antithrombotics: -DVT/anticoagulation: Lovenox 60 mg daily.  Vascular study negative             -antiplatelet therapy: N/A 3. Pain Management: Dilaudid 2 mg every 6 hours as needed pain 4. Mood: Melatonin 6 mg nightly, trazodone 100 mg nightly, Klonopin 1 mg nightly, Lexapro 20 mg daily, Valium 2.5 mg every 12 hours as needed anxiety  4/9- will try to get Neuropsych for pt due to anxiety.   4/10: Did not use valium today  4/12- neuropsych on schedule per SW  4/16- saw Neuropsych today- discussed SNF planning.  4/27- much less anxious- tolerating therapy well              -antipsychotic agents:  N/A 5. Neuropsych: This patient is capable of making decisions on his own behalf. 6. Skin/Wound/ Care/deep tissue pressure injury bilateral heels and lateral feet: Wound care as directed  4/11: Ordered betadine dressings for bilateral feet as he was receiving in acute care.   4/14- consulted [facility]: they asked for debridement- consulted Dr Sharol Given. .      4/23- will need to see Dr Sharol Given outpatient  for debridement after he leaves for SNF. Pruritis L>R calf trial sarna lotion , ?irritation from prevalon boots   7. Fluids/Electrolytes/Nutrition: Strict INO's with follow-up chemistries  4/9- labs look good except BUN still 46- is dry? But is stable/better than was 1 week ago- was 51.  4/12- will recheck labs in AM   4/13- BUN down to 40- doing better- con't to monitor weekly.  4/19. I personally reviewed the patient's labs today.   Labs stable, BUN still 40   -discussed nutrition  4/26- BUN down to 33- doing better  with patient today, encourage PO fluids 8.  Dysphagia.  Status post PEG tube 09/11/2019 per Dr. Worthy Keeler.  Diet has been advanced to regular.  Patient is still receiving nocturnal tube feeds by PEG tube.  4/9- doing calorie count, to see if can stop TFs  4/13- cannot stop TFs per dietician, since pt has high protein/calorie needs to aid healing, and is Dependent on staff to feed him everything.   4/20- asking dietician to move TFs to finish earlier so hungry for breakfast.   4/21- changed TFs from 6pm to 4am- 10 hours.   4/23- changed Prostat to [place]  4/26- tolerating well- con't regimen 9.  Status post tracheostomy 08/06/2019.  Decannulated 11/08/2019. 10.  Hypertension.  Norvasc 10 mg daily, Coreg 25 mg twice daily, Aldactone 25 mg daily.  Monitor with increased mobility  4/9-4/10 BP well controlled- continue regimen. Addendum: Hypotensive to 106/49 at night; decreased amlodipine dose to 49m.   4/17- BP 99/52 this AM- will decrease norvasc to 2.5 mg daily.  4/26- BP 102/50- a little  low, but overall, doing better  4/27- BP 122/69- much better- no orthostasis Sx's.  11.  History of asthma.  Continue nebulizers as directed  4/17- sats 99%- wearing 1L O2- will d/w nursing again- I think it can come off, unless nursing can show he needs it- his sats are 95-99% on 1L. Wrote order to wean, again.  4/23- off O2- tolerating well 12.  GERD.  Protonix 13.  BPH.  Flomax 0.4 mg daily. 14. Morbid obesity- current BMI of 40- will discuss diet, etc once more appropriate.  4/15- BMI down to 38.8- improving   4/23- weighed again- BMI up to 41.2- going in wrong direction, but has lost so much weight since here/he' was malnourished, so likely needed 184 Insomnia- con't meds as above  4/9-4/10 sleeps fine- is his normal amount of sleep  4/12- doesn't sleep well, but is chronic- on Klonopin AND trazodone 100 mg QHS  4/20- added benadryl to QHS prn 25 mg so if wakes up at night, can have benadryl  4/22- will take more metamucil so can try and avoid BM in middle of night.  4/27- doing better- no BM at night in 4 days.   16. Right ear wax: Debrox ear drops  4/20- stopped drops 17. Nausea/loose stool  4/18- will schedule Zofran at 0600 am to help with AM nausea.  4/19 already on fiber. Will add florastor as well to improve stool consistency  4/20- suggest benadryl if occurring in middle of night-ordering.   4/22- changed Prostat to [place] per pt request- take more metamucil 18/ Dispo  4/28- will need f/u with Dr LDagoberto Ligasand Dr DSharol Givenaftr d/c- to look at feet and f/u on function   LOS: 20 days A FACE TO FACE EVALUATION WAS PERFORMED  Amberlyn Martinezgarcia 12/04/2019, 8:46 AM

## 2019-12-26 IMAGING — MR MR LUMBAR SPINE W/O CM
4 of 5 series · 22 of 48 positions shown · non-contrast
Comparison: 09/19/2017 lumbar spine radiographs. 04/19/2017 CT
abdomen and pelvis.

CLINICAL DATA: 41 y/o M; lower back pain with left leg pain
increasing for 1 year. Suddenly severe 3 weeks ago. History of
lumbar disc surgery in 3333.

EXAM:
MRI LUMBAR SPINE WITHOUT CONTRAST
TECHNIQUE: Multiplanar, multisequence MR imaging of the lumbar spine was
performed. No intravenous contrast was administered.

[Series 3: T2 · sagittal · 4.0mm · 0.44mm/px · 6 of 12 slices shown (1 of 2)]
[im 1/12]
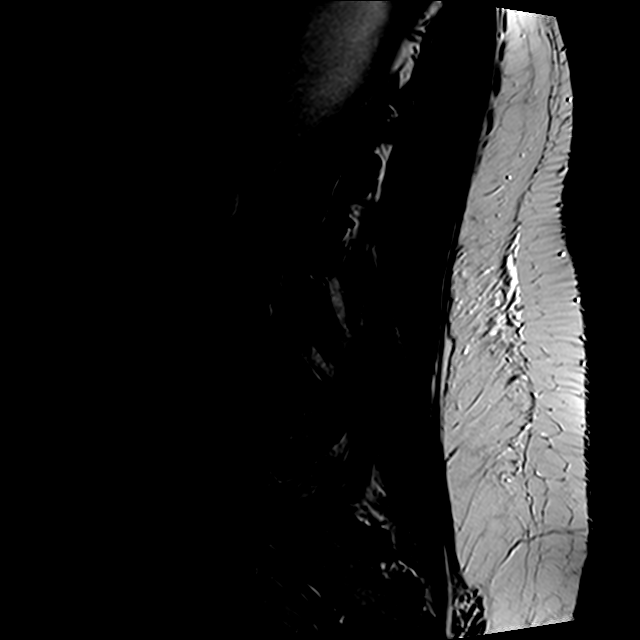
[im 3/12]
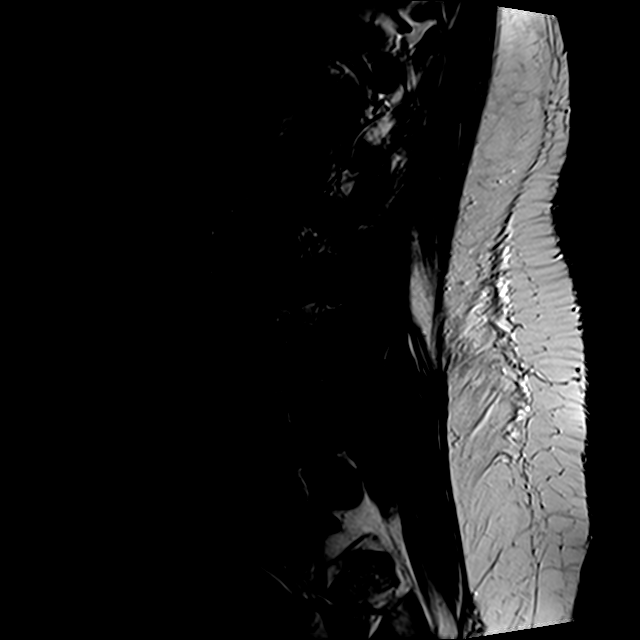
[im 5/12]
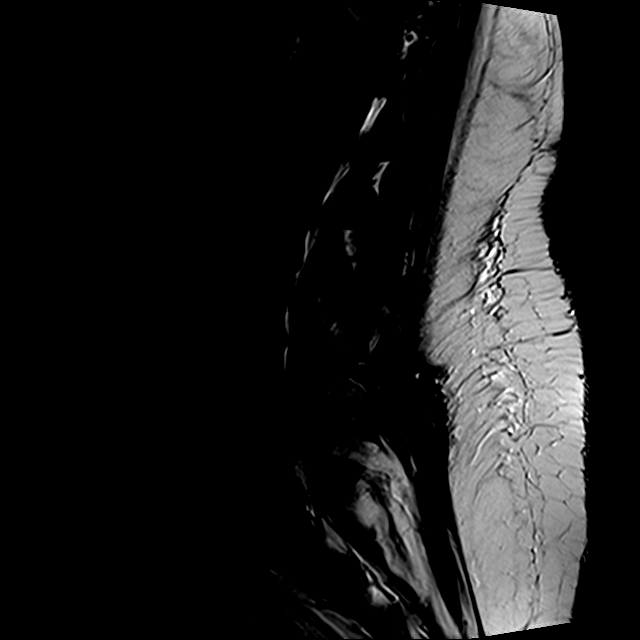
[im 7/12]
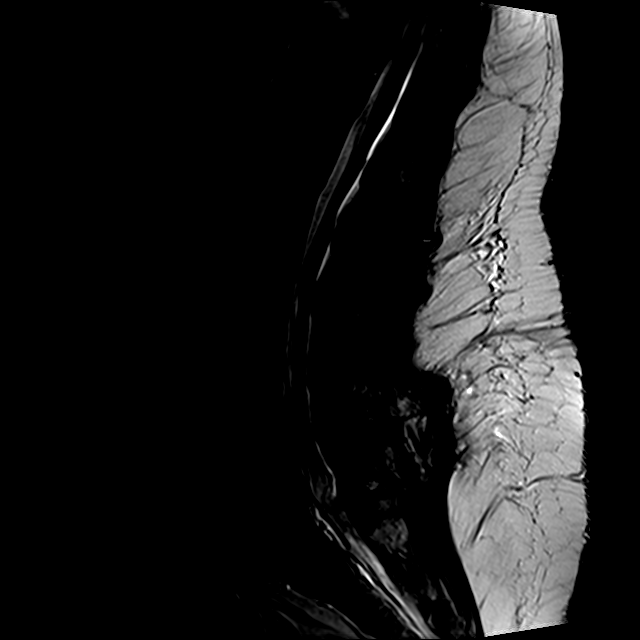
[im 9/12]
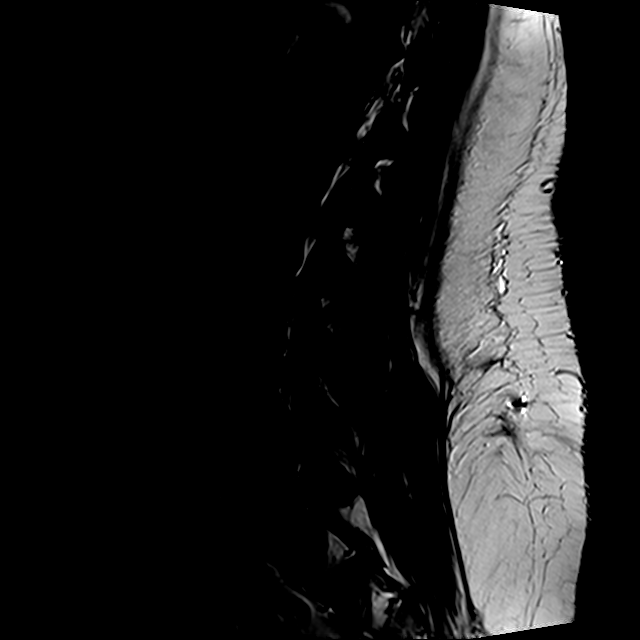
[im 12/12]
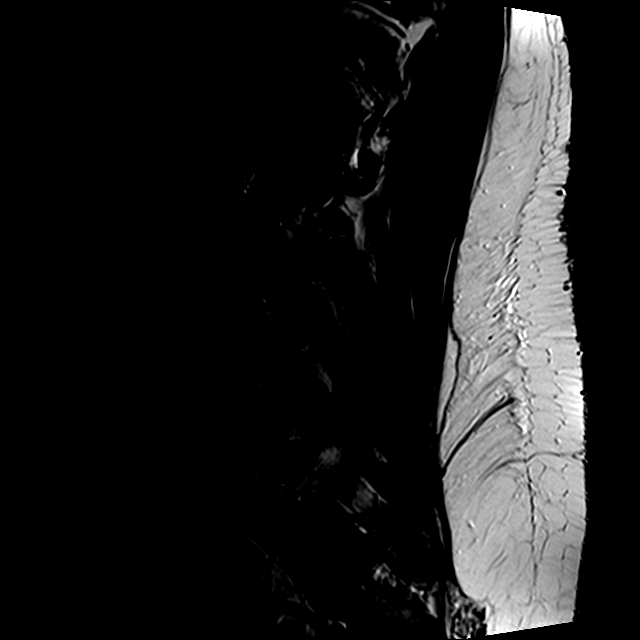

[Series 4: T1 · sagittal · 4.0mm · 0.55mm/px · 3 of 12 slices shown (1 of 2)]
[im 1/12]
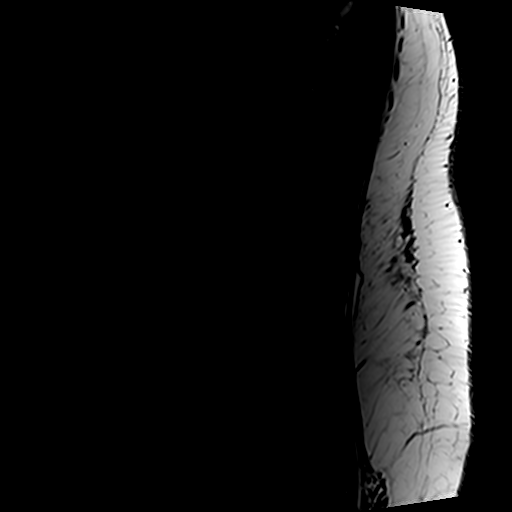
[im 6/12]
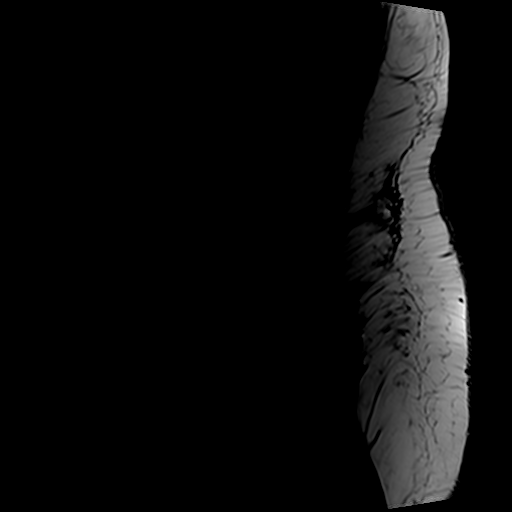
[im 12/12]
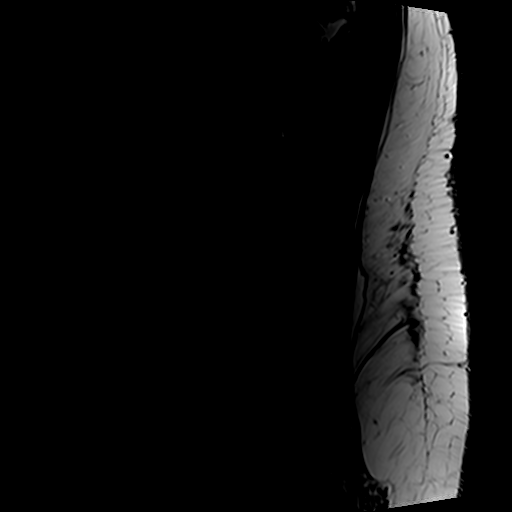

[Series 5: T1 · axial · 4.0mm · 0.39mm/px · z∈[-104,+64]mm · 3 of 36 slices shown (2 of 2)]
[im 5/36]
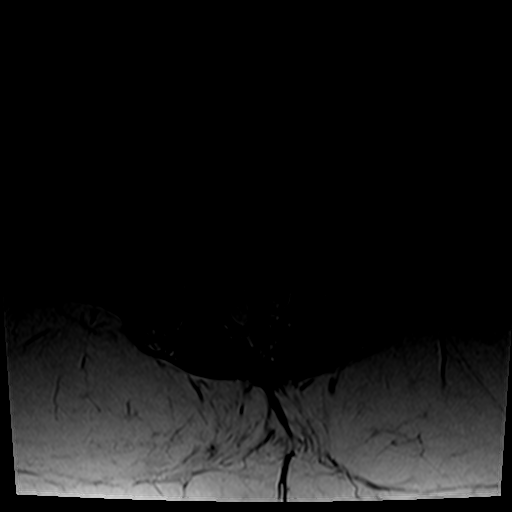
[im 19/36]
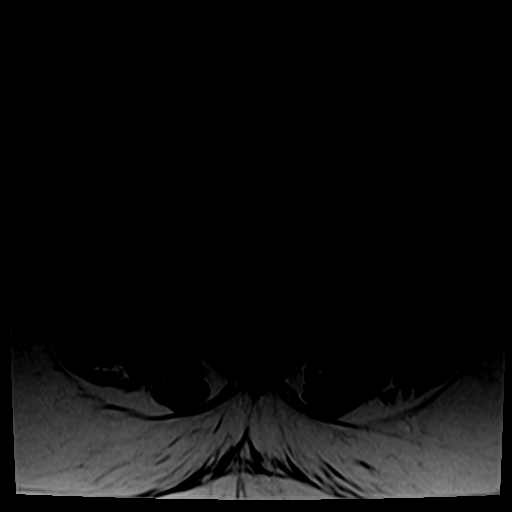
[im 31/36]
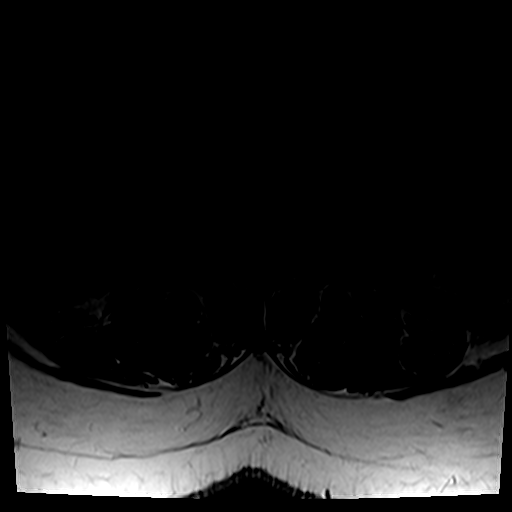

[Series 7: T2 · axial · 4.0mm · 0.78mm/px · z∈[-114,+136]mm · 10 of 36 slices shown (2 of 2)]
[im 3/36]
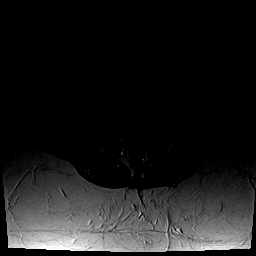
[im 5/36]
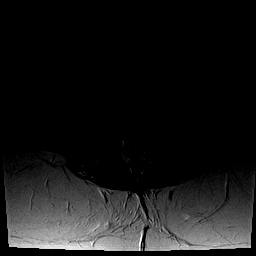
[im 8/36]
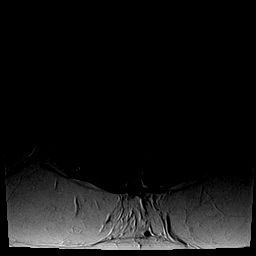
[im 12/36]
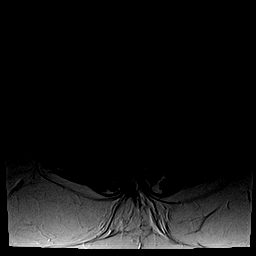
[im 17/36]
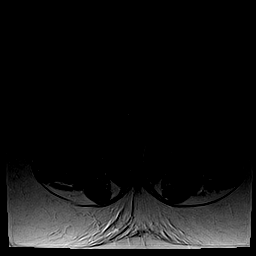
[im 19/36]
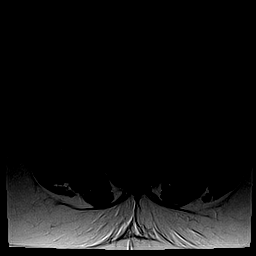
[im 22/36]
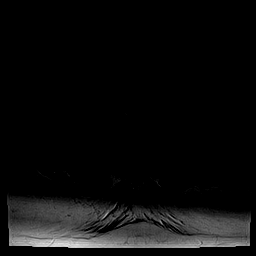
[im 26/36]
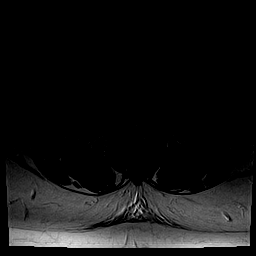
[im 31/36]
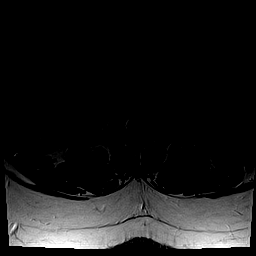
[im 36/36]
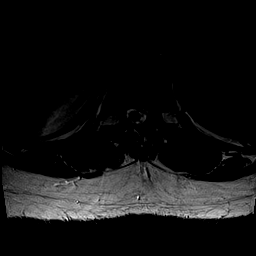

[22 of 48 positions shown; findings below may reference images not displayed]

FINDINGS: Segmentation:  Standard.

Alignment:  Physiologic.

Vertebrae: Mild edema within the right L4 and L5 pedicles without
fracture, probable mild stress reaction. No loss of vertebral body
height. No additional abnormal bone marrow signal. No abnormal disc
signal.

Conus medullaris and cauda equina: Conus extends to the L1 level.
Conus and cauda equina appear normal.

Paraspinal and other soft tissues: Negative.

Disc levels:

T11-12: 8 mm left subarticular disc protrusion with left anterior
cord impingement and moderate canal stenosis. Mild left foraminal
stenosis.

T12-L1: No significant disc displacement, foraminal stenosis, or
canal stenosis.

L1-2: No significant disc displacement, foraminal stenosis, or canal
stenosis.

L2-3: Small disc bulge and mild facet hypertrophy. No significant
foraminal or canal stenosis.

L3-4: Small disc bulge and mild facet hypertrophy. No significant
foraminal or canal stenosis.

L4-5: Small disc bulge with mild facet and ligamentum flavum
hypertrophy. Mild bilateral foraminal stenosis and mild canal
stenosis.

L5-S1: Minimal disc bulge, endplate marginal osteophytes, and 5 mm
left subarticular/foraminal disc protrusion. The protrusion contacts
the descending left S1 nerve root in lateral recess (series 3 image
8 and 9). Mild bilateral foraminal stenosis. No canal stenosis.
IMPRESSION: 1. Mild edema and right L4 and L5 pedicles without fracture,
probable mild stress reaction.
2. T11-12 moderate left subarticular disc protrusion with left
anterior cord impingement and moderate canal stenosis.
3. L5-S1 left subarticular/foraminal small disc protrusion with
contact on descending left S1 nerve root in lateral recess.
4. Multilevel mild foraminal stenosis. No high-grade foraminal
stenosis.

By: Josiah Fabien M.D.

## 2020-01-03 ENCOUNTER — Other Ambulatory Visit: Payer: Self-pay

## 2020-01-03 ENCOUNTER — Encounter
Payer: Managed Care, Other (non HMO) | Attending: Physical Medicine and Rehabilitation | Admitting: Physical Medicine and Rehabilitation

## 2020-01-03 ENCOUNTER — Encounter: Payer: Self-pay | Admitting: Physical Medicine and Rehabilitation

## 2020-01-03 VITALS — BP 97/65 | HR 85 | Temp 98.7°F | Ht 68.0 in | Wt 245.0 lb

## 2020-01-03 DIAGNOSIS — G7281 Critical illness myopathy: Secondary | ICD-10-CM | POA: Diagnosis not present

## 2020-01-03 DIAGNOSIS — R234 Changes in skin texture: Secondary | ICD-10-CM | POA: Diagnosis not present

## 2020-01-03 DIAGNOSIS — R269 Unspecified abnormalities of gait and mobility: Secondary | ICD-10-CM

## 2020-01-03 NOTE — Patient Instructions (Signed)
  S/p COVID with ICU myopathy   1. F/U with Dr Sharol Given- for feet eschar  2. Use the chemical stick- silver nitrate stick to clean up trach site- can either be done at SNF or by Dr Sharol Given.   3. Change PEG site daily- until closed up. On abdomen- change daily and as needed- might need pressure /tight dressing for first couple of days  4. Talked to Dr Earleen Newport in IR_ - he's the one that placed pt's PEG- don't have bumper on his PEG- has a balloon- and was pulled out/deflated at this time. Removed PEG today- had balloon deflated at the end- not a bumper.    5. F/U in 4 weeks- can call me if any questions about rehab/function

## 2020-01-03 NOTE — Progress Notes (Signed)
Subjective:    Patient ID: [name], male    DOB: 1975/11/24, 44 y.o.   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   Still at SNF- was sent to [facility] 4/28- has been 1 month today.   Still a lot of numbness, weakness esp in RUE.   Working on standing up- trying to take steps- at the most has taken 4-5 steps.   Can't stand very long- most total standing time is 2.5 minutes.   Can transfer from bed/to w/c and back by self- no Sliding board- just get to bed level- going downhill- and able to do self.   Dressing self with min assist and bathing min assist- can't get bottom and and back yet.  Dressing takes a lot of time- to get pants up- has a grabber- aggravating doesn't have a lot of patience.   Having some pain in feet still in feet- hasn't seen Dr Sharol Given-  Still has some eschar on feet-toes are pink not black anymore.  R wrist is still painful- and B/L knees and back pain.   R side mainly.  Takes pain meds as needed- doesn't want to get hooked on them.  Switched to something other than Dilaudid didn't want to get   Wants to get PEG out- had since was in hospital.     Pain Inventory Average Pain 5 Pain Right Now 5 My pain is sharp, burning and tingling  In the last 24 hours, has pain interfered with the following? General activity 2 Relation with others 1 Enjoyment of life 1 What TIME of day is your pain at its worst? daytime evening Sleep (in general) Fair  Pain is worse with: walking, sitting and standing Pain improves with: rest, therapy/exercise and medication Relief from Meds: 5  Mobility use a walker how many minutes can you walk? 1  ability to climb steps?  no do you drive?  yes use a wheelchair transfers alone Do you have any goals in this area?  yes  Function employed # of hrs/week . I need assistance with the following:  meal prep, household duties and  shopping  Neuro/Psych weakness numbness tingling trouble walking  Prior Studies HFU  Physicians involved in your care HFU   Family History  Problem Relation Age of Onset  . Asthma Brother   . Diabetes Father   . Hypertension Father   . Diabetes Paternal Grandmother   . Hypertension Paternal Grandmother   . Migraines Mother   . GER disease Mother   . Pancreatic cancer Maternal Grandmother   . Heart disease Maternal Grandfather    Social History   Socioeconomic History  . Marital status: Married    Spouse name: Not on file  . Number of children: 1  . Years of education: Not on file  . Highest education level: Not on file  Occupational History  . Occupation: delivery driver  Tobacco Use  . Smoking status: Never Smoker  . Smokeless tobacco: Never Used  Substance and Sexual Activity  . Alcohol use: Yes    Comment: rarely 2-3 times a year  . Drug use: No  . Sexual activity: Yes    Partners: Female  Other Topics Concern  . Not on file  Social History Narrative  . Not on file   Social Determinants of Health   Financial Resource Strain:   . Difficulty of Paying  Living Expenses:   Food Insecurity:   . Worried About Charity fundraiser in the Last Year:   . Arboriculturist in the Last Year:   Transportation Needs:   . Film/video editor (Medical):   Marland Kitchen Lack of Transportation (Non-Medical):   Physical Activity:   . Days of Exercise per Week:   . Minutes of Exercise per Session:   Stress:   . Feeling of Stress :   Social Connections:   . Frequency of Communication with Friends and Family:   . Frequency of Social Gatherings with Friends and Family:   . Attends Religious Services:   . Active Member of Clubs or Organizations:   . Attends Archivist Meetings:   Marland Kitchen Marital Status:    Past Surgical History:  Procedure Laterality Date  . CANNULATION FOR ECMO (EXTRACORPOREAL MEMBRANE OXYGENATION) N/A 08/28/2019   Procedure: CANNULATION FOR VV ECMO  (EXTRACORPOREAL MEMBRANE OXYGENATION);  Surgeon: Prescott Gum, Collier Salina, MD;  Location: [employer];  Service: Open Heart Surgery;  Laterality: N/A;  CRESCENT CANNULA  . CANNULATION FOR ECMO (EXTRACORPOREAL MEMBRANE OXYGENATION) N/A 09/10/2019   Procedure: CANNULATION FOR ECMO (EXTRACORPOREAL MEMBRANE OXYGENATION) PUTTING IN CRESCENT 32FR CANNULA  AND REMOVING GROING CANNULATION;  Surgeon: Wonda Olds, MD;  Location: [place];  Service: Open Heart Surgery;  Laterality: N/A;  PUTTING IN CRESCENT/REMOVING GROIN CANNULATION  . CYSTOSCOPY/URETEROSCOPY/HOLMIUM LASER/STENT PLACEMENT Left 04/18/2019   Procedure: LEFT URETEROSCOPY/HOLMIUM LASER/STENT PLACEMENT;  Surgeon: Ardis Hughs, MD;  Location[facility];  Service: Urology;  Laterality: Left;  . CYSTOSCOPY/URETEROSCOPY/HOLMIUM LASER/STENT PLACEMENT Left 05/02/2019   Procedure: CYSTOSCOPY/URETEROSCOPY/HOLMIUM LASER/STENT EXCHANGE;  Surgeon: Ardis Hughs, MD;  Location: [facility];  Service: Urology;  Laterality: Left;  . ECMO CANNULATION N/A 08/03/2019   Procedure: ECMO CANNULATION;  Surgeon: Wonda Olds, MD;  Location: [place] [facility];  Service: Cardiovascular;  Laterality: N/A;  . ESOPHAGOGASTRODUODENOSCOPY N/A 09/11/2019   Procedure: ESOPHAGOGASTRODUODENOSCOPY (EGD);  Surgeon: Wonda Olds, MD;  Location: [place];  Service: Thoracic;  Laterality: N/A;  . IR [place] Vivianne Master  10/14/2019  . IRRIGATION AND DEBRIDEMENT SHOULDER Right 09/29/2017   Procedure: IRRIGATION AND DEBRIDEMENT SHOULDER;  Surgeon: Leandrew Koyanagi, MD;  Location: [place];  Service: Orthopedics;  Laterality: Right;  . [place] SURGERY  2002  . NASAL ENDOSCOPY WITH EPISTAXIS CONTROL N/A 08/31/2019   Procedure: NASAL ENDOSCOPY WITH EPISTAXIS CONTROL WITH CAUTERIZATION;  Surgeon: Melida Quitter, MD;  Location: [place];  Service: ENT;  Laterality: N/A;  . PORTACATH PLACEMENT N/A 09/11/2019   Procedure: PEG TUBE INSERTION - BEDSIDE;  Surgeon:  Wonda Olds, MD;  Location: [place];  Service: Thoracic;  Laterality: N/A;  . TEE WITHOUT CARDIOVERSION N/A 08/28/2019   Procedure: TRANSESOPHAGEAL ECHOCARDIOGRAM (TEE);  Surgeon: Prescott Gum, Collier Salina, MD;  Location: [place];  Service: Open Heart Surgery;  Laterality: N/A;  . TEE WITHOUT CARDIOVERSION N/A 09/10/2019   Procedure: TRANSESOPHAGEAL ECHOCARDIOGRAM (TEE);  Surgeon: Wonda Olds, MD;  Location: [place];  Service: Open Heart Surgery;  Laterality: N/A;   Past Medical History:  Diagnosis Date  . Asthma   . GERD (gastroesophageal reflux disease)   . Headache   . History of kidney stones    LEFT URETERAL STONE  . HTN (hypertension)   . Pancreatitis 2018   GALLBALDDER SLUDGE CAUSED ISSUED RESOLVED   BP 97/65   Pulse 85   Temp 98.7 F (37.1 C)   Ht 5' 8"  (1.727 m)  Wt 245 lb (111.1 kg)   SpO2 90%   BMI 37.25 kg/m   Opioid Risk Score:   Fall Risk Score:  `1  Depression screen PHQ 2/9  Depression screen [facility] 2/9 12/12/2017 11/01/2017  Decreased Interest 0 0  Down, Depressed, Hopeless 0 0  PHQ - 2 Score 0 0    Review of Systems  Constitutional: Positive for unexpected weight change.  Respiratory: Positive for cough.   Gastrointestinal: Positive for constipation and diarrhea.  Musculoskeletal: Positive for gait problem.  Skin: Positive for rash.  Neurological: Positive for weakness and numbness.  All other systems reviewed and are negative.      Objective:   Physical Exam Awake, alert, appropriate, with wife, NAD, in manual w/c.  Where trach was, has a little outpouching of skin that's pink and "inner skin"- making sloigh Has PEG still in place- looks good.  Removed PEG of note     Assessment & Plan:   S/p COVID with ICU myopathy   1. F/U with Dr Sharol Given- for feet eschar  2. Use the chemical stick- silver nitrate stick to clean up trach site- can either be done at SNF or by Dr Sharol Given.   3. Change PEG site daily- until closed up. On abdomen- change daily and as  needed- might need pressure /tight dressing for first couple of days  4. Talked to Dr Earleen Newport in IR_ - he's the one that placed pt's PEG- don't have bumper on his PEG- has a balloon- and was pulled out/deflated at this time. Removed PEG today- had balloon deflated at the end- not a bumper.    5. F/U in 4 weeks- can call me about rehab/function- if needed  Spent a total of 35 minutes on visit- as detailed above.

## 2020-01-16 ENCOUNTER — Ambulatory Visit: Payer: Self-pay

## 2020-01-16 ENCOUNTER — Ambulatory Visit (INDEPENDENT_AMBULATORY_CARE_PROVIDER_SITE_OTHER): Payer: Managed Care, Other (non HMO) | Admitting: Orthopedic Surgery

## 2020-01-16 ENCOUNTER — Telehealth: Payer: Self-pay | Admitting: Orthopedic Surgery

## 2020-01-16 ENCOUNTER — Encounter: Payer: Self-pay | Admitting: Orthopedic Surgery

## 2020-01-16 VITALS — Ht 68.0 in | Wt 245.0 lb

## 2020-01-16 DIAGNOSIS — M6701 Short Achilles tendon (acquired), right ankle: Secondary | ICD-10-CM

## 2020-01-16 DIAGNOSIS — M24561 Contracture, right knee: Secondary | ICD-10-CM

## 2020-01-16 DIAGNOSIS — M25562 Pain in left knee: Secondary | ICD-10-CM | POA: Diagnosis not present

## 2020-01-16 DIAGNOSIS — M6702 Short Achilles tendon (acquired), left ankle: Secondary | ICD-10-CM

## 2020-01-16 DIAGNOSIS — M25561 Pain in right knee: Secondary | ICD-10-CM

## 2020-01-16 DIAGNOSIS — M79671 Pain in right foot: Secondary | ICD-10-CM

## 2020-01-16 DIAGNOSIS — M24562 Contracture, left knee: Secondary | ICD-10-CM

## 2020-01-16 DIAGNOSIS — M79672 Pain in left foot: Secondary | ICD-10-CM

## 2020-01-16 DIAGNOSIS — L97521 Non-pressure chronic ulcer of other part of left foot limited to breakdown of skin: Secondary | ICD-10-CM

## 2020-01-16 DIAGNOSIS — L97511 Non-pressure chronic ulcer of other part of right foot limited to breakdown of skin: Secondary | ICD-10-CM

## 2020-01-16 NOTE — Progress Notes (Signed)
Office Visit Note   Patient: [name]           Date of Birth: [birth year]           MRN: [id number] Visit Date: 01/16/2020              Requested by: Tamsen Roers, [place],  [place] [id number] PCP: Tamsen Roers, MD  Chief Complaint  Patient presents with  . Right Knee - Pain, New Patient (Initial Visit)  . Left Knee - Pain  . Right Foot - Pain  . Left Foot - Pain      HPI: Patient is a 44 year old gentleman with a complicated recent medical history with hospitalization for prolonged period of time secondary to Covid.  Patient was hospitalized for essentially 5 months.  He is currently in skilled nursing complains of bilateral knee pain bilateral foot pain with a large eschar on the plantar lateral aspect of both feet patient is also status post a tracheostomy.  Assessment & Plan: Visit Diagnoses:  1. Pain in both knees, unspecified chronicity   2. Pain in both feet   3. Contracture of Achilles tendon, bilateral   4. Contractures involving both knees   5. Ulcer of both feet, limited to breakdown of skin ([place])     Plan: Patient was given orders for physical therapy at skilled nursing to work on knee extension for both knees as well as dorsiflexion for both ankles patient has developed significant contractures.  During patient's prolonged hospitalization he developed decubitus eschar over the plantar lateral aspect of both feet this is stable no signs of infection patient was given orders to discontinue painting the foot with Betadine and used the vive wear compression socks around-the-clock.  Follow-Up Instructions: No follow-ups on file.   Ortho Exam  Patient is alert, oriented, no adenopathy, well-dressed, normal affect, normal respiratory effort. Examination patient has palpable pulses bilaterally with good hair growth down to the foot.  He has developed contractures of both knees and lacks 20 degrees to full extension of both knees attempted extension is  painful there is no effusion of either knees there is no redness no cellulitis no tenderness to palpation anteriorly over the joint.  Examination of his neck he has a healing tracheostomy with no complicating features.  There is a small area of granulation tissue which is not completely healed yet.  Examination of both feet he has an equinus contracture both feet lacks 30 degrees of dorsiflexion to neutral of both feet.  He has a stable eschar over the plantar aspect of both feet with his hips externally rotated he developed a pressure ischemic ulcer over both feet from his prolonged hospitalization.  Patient's calf measures 42 cm in circumference.  Patient was given a prescription for a size extra-large compression sock.  Imaging: XR Knee 1-2 Views Left  Result Date: 01/16/2020 2 view radiographs of the left knee shows a congruent joint space osteoporosis with cystic changes within the bone  XR Foot 2 Views Left  Result Date: 01/16/2020 2 view radiographs of the left foot shows osteoporosis cystic bony changes without fracture..  XR Foot 2 Views Right  Result Date: 01/16/2020 2 view radiographs of the right foot shows osteoporosis with cystic bony changes without fractures.  XR Knee 1-2 Views Right  Result Date: 01/16/2020 2 view radiographs of the right knee shows a congruent joint space with osteoporosis and cystic changes within the bone  No images are attached to  the encounter.  Labs: Lab Results  Component Value Date   HGBA1C 5.5 09/23/2019   HGBA1C 6.7 (H) 07/24/2019   HGBA1C 5.7 (H) 09/30/2017   ESRSEDRATE 56 (H) 10/11/2019   ESRSEDRATE 6 12/12/2017   ESRSEDRATE 75 (H) 09/30/2017   CRP 13.9 (H) 10/11/2019   CRP 0.7 08/01/2019   CRP 1.7 (H) 07/31/2019   REPTSTATUS 10/16/2019 FINAL 10/11/2019   REPTSTATUS 10/16/2019 FINAL 10/11/2019   GRAMSTAIN  10/09/2019    MODERATE WBC PRESENT, PREDOMINANTLY PMN NO ORGANISMS SEEN    GRAMSTAIN  10/09/2019    FEW WBC PRESENT,BOTH PMN  AND MONONUCLEAR NO ORGANISMS SEEN    CULT  10/11/2019    NO GROWTH 5 DAYS Performed at [facility], [place] [address], [place], [place] [address]    CULT  10/11/2019    NO GROWTH 5 DAYS Performed at [facility] [address] [place], [place] [address]    LABORGA SERRATIA MARCESCENS (A) 09/23/2019     Lab Results  Component Value Date   ALBUMIN 2.9 (L) 12/02/2019   ALBUMIN 2.7 (L) 11/25/2019   ALBUMIN 2.8 (L) 11/15/2019    Lab Results  Component Value Date   MG 1.7 11/13/2019   MG 1.7 11/11/2019   MG 1.9 11/10/2019   No results found for: VD25OH  No results found for: PREALBUMIN CBC EXTENDED Latest Ref Rng & Units 12/02/2019 11/25/2019 11/19/2019  WBC 4.0 - 10.5 K/uL 8.4 7.7 7.4  RBC 4.22 - 5.81 MIL/uL 4.87 4.39 4.17(L)  HGB 13.0 - 17.0 g/dL 13.2 12.1(L) 11.4(L)  HCT 39 - 52 % 42.7 38.9(L) 36.7(L)  PLT 150 - 400 K/uL 178 182 186  NEUTROABS 1.7 - 7.7 K/uL 5.2 4.6 4.5  LYMPHSABS 0.7 - 4.0 K/uL 2.0 2.0 1.9     Body mass index is 37.25 kg/m.  Orders:  Orders Placed This Encounter  Procedures  . XR Foot 2 Views Left  . XR Foot 2 Views Right  . XR Knee 1-2 Views Right  . XR Knee 1-2 Views Left   No orders of the defined types were placed in this encounter.    Procedures: No procedures performed  Clinical Data: No additional findings.  ROS:  All other systems negative, except as noted in the HPI. Review of Systems  Objective: Vital Signs: Ht 5' 8"  (1.727 m)   Wt 245 lb (111.1 kg)   BMI 37.25 kg/m   Specialty Comments:  No specialty comments available.  PMFS History: Patient Active Problem List   Diagnosis Date Noted  . Eschar of foot   . Intensive care (ICU) myopathy 11/14/2019  . History of COVID-19   . ARDS (adult respiratory distress syndrome) ([place])   . Pressure injury of skin 08/22/2019  . Need for emotional support   . Chest tube in place   . Personal history of ECMO   . Advanced care planning/counseling discussion   .  Acute respiratory distress syndrome (ARDS) due to COVID-19 virus ([place])   . Palliative care by specialist   . Goals of care, counseling/discussion   . Advanced directives, counseling/discussion   . Subcutaneous crepitus   . Pneumonia due to COVID-19 virus 07/23/2019  . Acute respiratory failure ([place]) 07/22/2019  . Persistent asthma with undetermined severity   . Thrombocytopenia ([place])   . Leukopenia   . Fever   . Gram positive bacterial infection   . Septic arthritis of right acromioclavicular joint ([place]) 09/29/2017  . Chronic left-sided low back pain with  left-sided sciatica 09/19/2017  . Epigastric pain   . Pancreatitis 04/17/2017  . Essential hypertension 04/17/2017  . Moderate persistent asthma 07/21/2015  . Allergic rhinoconjunctivitis 07/21/2015  . GERD (gastroesophageal reflux disease) 07/21/2015  . Abnormal gait 11/12/2009  . TARSAL TUNNEL SYNDROME, LEFT 10/08/2009  . PES PLANUS 10/08/2009   Past Medical History:  Diagnosis Date  . Asthma   . GERD (gastroesophageal reflux disease)   . Headache   . History of kidney stones    LEFT URETERAL STONE  . HTN (hypertension)   . Pancreatitis 2018   GALLBALDDER SLUDGE CAUSED ISSUED RESOLVED    Family History  Problem Relation Age of Onset  . Asthma Brother   . Diabetes Father   . Hypertension Father   . Diabetes Paternal Grandmother   . Hypertension Paternal Grandmother   . Migraines Mother   . GER disease Mother   . Pancreatic cancer Maternal Grandmother   . Heart disease Maternal Grandfather     Past Surgical History:  Procedure Laterality Date  . CANNULATION FOR ECMO (EXTRACORPOREAL MEMBRANE OXYGENATION) N/A 08/28/2019   Procedure: CANNULATION FOR VV ECMO (EXTRACORPOREAL MEMBRANE OXYGENATION);  Surgeon: Prescott Gum, Collier Salina, MD;  Location: [place];  Service: Open Heart Surgery;  Laterality: N/A;  CRESCENT CANNULA  . CANNULATION FOR ECMO (EXTRACORPOREAL MEMBRANE OXYGENATION) N/A 09/10/2019   Procedure: CANNULATION FOR ECMO  (EXTRACORPOREAL MEMBRANE OXYGENATION) PUTTING IN CRESCENT 32FR CANNULA  AND REMOVING GROING CANNULATION;  Surgeon: Wonda Olds, MD;  Location: [place];  Service: Open Heart Surgery;  Laterality: N/A;  PUTTING IN CRESCENT/REMOVING GROIN CANNULATION  . CYSTOSCOPY/URETEROSCOPY/HOLMIUM LASER/STENT PLACEMENT Left 04/18/2019   Procedure: LEFT URETEROSCOPY/HOLMIUM LASER/STENT PLACEMENT;  Surgeon: Ardis Hughs, MD;  [facility];  Service: Urology;  Laterality: Left;  . CYSTOSCOPY/URETEROSCOPY/HOLMIUM LASER/STENT PLACEMENT Left 05/02/2019   Procedure: CYSTOSCOPY/URETEROSCOPY/HOLMIUM LASER/STENT EXCHANGE;  Surgeon: Ardis Hughs, MD;  [facility];  Service: Urology;  Laterality: Left;  . ECMO CANNULATION N/A 08/03/2019   Procedure: ECMO CANNULATION;  Surgeon: Wonda Olds, MD;  Location: [place] [facility];  Service: Cardiovascular;  Laterality: N/A;  . ESOPHAGOGASTRODUODENOSCOPY N/A 09/11/2019   Procedure: ESOPHAGOGASTRODUODENOSCOPY (EGD);  Surgeon: Wonda Olds, MD;  Location: [place];  Service: Thoracic;  Laterality: N/A;  . IR [place] Vivianne Master  10/14/2019  . IRRIGATION AND DEBRIDEMENT SHOULDER Right 09/29/2017   Procedure: IRRIGATION AND DEBRIDEMENT SHOULDER;  Surgeon: Leandrew Koyanagi, MD;  Location: [place];  Service: Orthopedics;  Laterality: Right;  . [place] SURGERY  2002  . NASAL ENDOSCOPY WITH EPISTAXIS CONTROL N/A 08/31/2019   Procedure: NASAL ENDOSCOPY WITH EPISTAXIS CONTROL WITH CAUTERIZATION;  Surgeon: Melida Quitter, MD;  Location: [name];  Service: ENT;  Laterality: N/A;  . PORTACATH PLACEMENT N/A 09/11/2019   Procedure: PEG TUBE INSERTION - BEDSIDE;  Surgeon: Wonda Olds, MD;  Location: [place];  Service: Thoracic;  Laterality: N/A;  . TEE WITHOUT CARDIOVERSION N/A 08/28/2019   Procedure: TRANSESOPHAGEAL ECHOCARDIOGRAM (TEE);  Surgeon: Prescott Gum, Collier Salina, MD;  Location: [place];  Service: Open Heart Surgery;  Laterality: N/A;   . TEE WITHOUT CARDIOVERSION N/A 09/10/2019   Procedure: TRANSESOPHAGEAL ECHOCARDIOGRAM (TEE);  Surgeon: Wonda Olds, MD;  Location: [place];  Service: Open Heart Surgery;  Laterality: N/A;   Social History   Occupational History  . Occupation: delivery driver  Tobacco Use  . Smoking status: Never Smoker  . Smokeless tobacco: Never Used  Vaping Use  . Vaping Use: Never used  Substance and Sexual Activity  . Alcohol use: Yes    Comment: rarely 2-3 times a year  . Drug use: No  . Sexual activity: Yes    Partners: Female

## 2020-01-16 NOTE — Telephone Encounter (Signed)
Vee from [facility] called to get clarification on the patient's wound care orders.  CB#[phone].  Thank you.

## 2020-01-17 NOTE — Telephone Encounter (Signed)
I called [facility] twice trying to get in touch with patient's nurse to clarify orders. No one never picked up from transfer. Will hold message until someone from facility call us back for the orders to be clarified. Number was left with receptionist and left message for return call from nurse.

## 2020-01-31 ENCOUNTER — Other Ambulatory Visit: Payer: Self-pay

## 2020-01-31 ENCOUNTER — Ambulatory Visit: Payer: Managed Care, Other (non HMO) | Admitting: Neurology

## 2020-01-31 ENCOUNTER — Encounter: Payer: Self-pay | Admitting: Neurology

## 2020-01-31 VITALS — BP 115/75 | HR 72 | Ht 68.0 in | Wt 262.0 lb

## 2020-01-31 DIAGNOSIS — G7281 Critical illness myopathy: Secondary | ICD-10-CM

## 2020-01-31 NOTE — Progress Notes (Signed)
Reason for visit: Critical illness myopathy/neuropathy  Referring physician: Dr. Lucie Leather is a 44 y.o. male  History of present illness:  [name] is a 44 year old right-handed white male with a history of hypertension, asthma, and obesity.  The patient was admitted to the hospital with a Covid pneumonia on 22 July 2019.  The patient required intubation by 03 August 2019, he was on the ventilator until sometime in mid February 2021.  The patient remained in the hospital until he was transferred to the inpatient rehab service on 14 November 2019.  The patient required a tracheostomy, he had a G-tube placement, he lost approximately 62 pounds with this illness.  When he was in inpatient rehab, he was unable to ambulate, he was barely able to feed himself.  The patient has now been transferred to a rehab facility with [facility].  He is getting ongoing physical and occupational therapy.  He has now been able to walk short distances with a walker, he is better able to feed himself.  He has had some return of function of the arms and legs.  The patient has reported some numbness in the feet and hands up to the elbows.  The patient will occasionally have lancinating pains in the legs that may make it difficult for him to sleep at night.  The patient has not had any falls.  He denies issues controlling the bowels or the bladder.  He has not had any headaches or dizziness or vision changes.  He is eating and swallowing well.  He has had no alteration in speech.  He is recovering from a recent bout of pneumonia.  He is on oxygen therapy.  He is sent to this office for further evaluation.  Past Medical History:  Diagnosis Date  . Asthma   . GERD (gastroesophageal reflux disease)   . Headache   . History of kidney stones    LEFT URETERAL STONE  . HTN (hypertension)   . Pancreatitis 2018   GALLBALDDER SLUDGE CAUSED ISSUED RESOLVED    Past Surgical History:  Procedure  Laterality Date  . CANNULATION FOR ECMO (EXTRACORPOREAL MEMBRANE OXYGENATION) N/A 08/28/2019   Procedure: CANNULATION FOR VV ECMO (EXTRACORPOREAL MEMBRANE OXYGENATION);  Surgeon: Prescott Gum, Collier Salina, MD;  Location: [place];  Service: Open Heart Surgery;  Laterality: N/A;  CRESCENT CANNULA  . CANNULATION FOR ECMO (EXTRACORPOREAL MEMBRANE OXYGENATION) N/A 09/10/2019   Procedure: CANNULATION FOR ECMO (EXTRACORPOREAL MEMBRANE OXYGENATION) PUTTING IN CRESCENT 32FR CANNULA  AND REMOVING GROING CANNULATION;  Surgeon: Wonda Olds, MD;  Location: [place];  Service: Open Heart Surgery;  Laterality: N/A;  PUTTING IN CRESCENT/REMOVING GROIN CANNULATION  . CYSTOSCOPY/URETEROSCOPY/HOLMIUM LASER/STENT PLACEMENT Left 04/18/2019   Procedure: LEFT URETEROSCOPY/HOLMIUM LASER/STENT PLACEMENT;  Surgeon: Ardis Hughs, MD;  Location: [facility];  Service: Urology;  Laterality: Left;  . CYSTOSCOPY/URETEROSCOPY/HOLMIUM LASER/STENT PLACEMENT Left 05/02/2019   Procedure: CYSTOSCOPY/URETEROSCOPY/HOLMIUM LASER/STENT EXCHANGE;  Surgeon: Ardis Hughs, MD;  [facility];  Service: Urology;  Laterality: Left;  . ECMO CANNULATION N/A 08/03/2019   Procedure: ECMO CANNULATION;  Surgeon: Wonda Olds, MD;  Location: [place] [facility];  Service: Cardiovascular;  Laterality: N/A;  . ESOPHAGOGASTRODUODENOSCOPY N/A 09/11/2019   Procedure: ESOPHAGOGASTRODUODENOSCOPY (EGD);  Surgeon: Wonda Olds, MD;  Location: [place];  Service: Thoracic;  Laterality: N/A;  . IR [place] Vivianne Master  10/14/2019  . IRRIGATION AND DEBRIDEMENT SHOULDER Right 09/29/2017   Procedure: IRRIGATION AND DEBRIDEMENT SHOULDER;  Surgeon: Leandrew Koyanagi, MD;  Location: [place];  Service: Orthopedics;  Laterality: Right;  . [place] SURGERY  2002  . NASAL ENDOSCOPY WITH EPISTAXIS CONTROL N/A 08/31/2019   Procedure: NASAL ENDOSCOPY WITH EPISTAXIS CONTROL WITH CAUTERIZATION;  Surgeon: Melida Quitter, MD;  Location: [place];   Service: ENT;  Laterality: N/A;  . PORTACATH PLACEMENT N/A 09/11/2019   Procedure: PEG TUBE INSERTION - BEDSIDE;  Surgeon: Wonda Olds, MD;  Location: [place];  Service: Thoracic;  Laterality: N/A;  . TEE WITHOUT CARDIOVERSION N/A 08/28/2019   Procedure: TRANSESOPHAGEAL ECHOCARDIOGRAM (TEE);  Surgeon: Prescott Gum, Collier Salina, MD;  Location: [place];  Service: Open Heart Surgery;  Laterality: N/A;  . TEE WITHOUT CARDIOVERSION N/A 09/10/2019   Procedure: TRANSESOPHAGEAL ECHOCARDIOGRAM (TEE);  Surgeon: Wonda Olds, MD;  Location: [place];  Service: Open Heart Surgery;  Laterality: N/A;    Family History  Problem Relation Age of Onset  . Asthma Brother   . Diabetes Father   . Hypertension Father   . Diabetes Paternal Grandmother   . Hypertension Paternal Grandmother   . Migraines Mother   . GER disease Mother   . Pancreatic cancer Maternal Grandmother   . Heart disease Maternal Grandfather     Social history:  reports that he has never smoked. He has never used smokeless tobacco. He reports current alcohol use. He reports that he does not use drugs.  Medications:  Prior to Admission medications   Medication Sig Start Date End Date Taking? Authorizing Provider  acetaminophen (TYLENOL) 325 MG tablet Place 2 tablets (650 mg total) into feeding tube every 6 (six) hours as needed for mild pain or headache (fever >/= 101). 11/14/19  Yes Simmons, Brittainy M, PA-C  amLODipine (NORVASC) 2.5 MG tablet Take 1 tablet (2.5 mg total) by mouth daily. 12/04/19  Yes Angiulli, Lavon Paganini, PA-C  carvedilol (COREG) 25 MG tablet Take 1 tablet (25 mg total) by mouth 2 (two) times daily with a meal. 11/14/19  Yes Lyda Jester M, PA-C  cetirizine (ZYRTEC) 10 MG tablet Take 1 tablet (10 mg total) by mouth daily. 02/06/18  Yes Kozlow, Donnamarie Poag, MD  clonazePAM (KLONOPIN) 1 MG tablet Take 1 mg by mouth at bedtime.   Yes [provider]  escitalopram (LEXAPRO) 20 MG tablet Take 1 tablet (20 mg total) by mouth daily.  12/04/19  Yes Angiulli, Lavon Paganini, PA-C  GUAIFENESIN ER PO Take 600 mg by mouth every 12 (twelve) hours.   Yes [provider]  HYDROcodone-acetaminophen (NORCO/VICODIN) 5-325 MG tablet Take 1 tablet by mouth every 6 (six) hours as needed for moderate pain.   Yes [provider]  Melatonin 10 MG TABS Take 10 mg by mouth at bedtime as needed.   Yes [provider]  montelukast (SINGULAIR) 10 MG tablet Take 1 tablet (10 mg total) by mouth at bedtime. 11/14/19  Yes Rosita Fire, Brittainy M, PA-C  pantoprazole (PROTONIX) 40 MG tablet Take 1 tablet (40 mg total) by mouth daily. 11/15/19  Yes Rosita Fire, Brittainy M, PA-C  polycarbophil (FIBERCON) 625 MG tablet Take 1 tablet (625 mg total) by mouth daily. 11/15/19  Yes Simmons, Brittainy M, PA-C  saccharomyces boulardii (FLORASTOR) 250 MG capsule Take 1 capsule (250 mg total) by mouth 2 (two) times daily. 12/04/19  Yes Angiulli, Lavon Paganini, PA-C  spironolactone (ALDACTONE) 25 MG tablet Take 1 tablet (25 mg total) by mouth daily. 11/15/19  Yes Lyda Jester M, PA-C  SYMBICORT 160-4.5 MCG/ACT inhaler Inhale 2 puffs into  the lungs daily as needed (cough or wheeze). Inhale two puffs ONCE daily to prevent cough or wheeze. Rinse, gargle, and spit after use. 08/20/19  Yes Valentina Shaggy, MD  tamsulosin (FLOMAX) 0.4 MG CAPS capsule Take 1 capsule (0.4 mg total) by mouth daily after supper. 11/14/19  Yes Simmons, Brittainy M, PA-C  traZODone (DESYREL) 100 MG tablet Take 1 tablet (100 mg total) by mouth at bedtime. 12/04/19  Yes Angiulli, Lavon Paganini, PA-C  triamcinolone (NASACORT ALLERGY 24HR) 55 MCG/ACT AERO nasal inhaler Place 2 sprays into the nose daily. Patient taking differently: Place 2 sprays into the nose daily as needed (allergies).  02/06/18  Yes Kozlow, Donnamarie Poag, MD     No Known Allergies  ROS:  Out of a complete 14 system review of symptoms, the patient complains only of the following symptoms, and all other reviewed systems are  negative.  Numbness in the feet, lancinating pains Muscle weakness Walking difficulty  Blood pressure 115/75, pulse 72, height 5' 8"  (1.727 m), weight 262 lb (118.8 kg).  Physical Exam  General: The patient is alert and cooperative at the time of the examination.  The patient is markedly obese.  Eyes: Pupils are equal, round, and reactive to light. Discs are flat bilaterally.  Neck: The neck is supple, no carotid bruits are noted.  Respiratory: The respiratory examination is clear.  Cardiovascular: The cardiovascular examination reveals a regular rate and rhythm, no obvious murmurs or rubs are noted.  Skin: Extremities are without significant edema.  Neurologic Exam  Mental status: The patient is alert and oriented x 3 at the time of the examination. The patient has apparent normal recent and remote memory, with an apparently normal attention span and concentration ability.  Cranial nerves: Facial symmetry is present. There is good sensation of the face to pinprick and soft touch bilaterally. The strength of the facial muscles and the muscles to head turning and shoulder shrug are normal bilaterally. Speech is well enunciated, no aphasia or dysarthria is noted. Extraocular movements are full. Visual fields are full. The tongue is midline, and the patient has symmetric elevation of the soft palate. No obvious hearing deficits are noted.  Motor: The motor testing reveals 3/5 strength of the biceps muscles bilaterally, some intrinsic muscle weakness of the hands bilaterally, right greater than left.  Patient has slight weakness with external rotation of the arms, he has fairly good triceps muscle strength bilaterally and deltoid muscle strength.  With the lower extremities, there is 3/5 strength with hamstring muscles bilaterally, 4/5 strength with hip flexion and knee extension, prominent bilateral foot drops are seen, left greater than right. Good symmetric motor tone is noted  throughout.  Sensory: Sensory testing is notable for some decrease in pinprick sensation in both hands, more normal in the forearms and upper arms.  There is decrease in pinprick sensation in the feet, no definite stocking pattern pinprick sensory deficit otherwise is noted.  Significant impairment of vibration sensation and position sensation in both feet is seen, left is worse than the right.  Vibration sensation and position sensation are more normal in the hands. No evidence of extinction is noted.  Coordination: Cerebellar testing reveals good finger-nose-finger and heel-to-shin bilaterally.  Gait and station: Attempts to stand the patient were made, the patient is not able to get to a fully upright posture, tends to collapse back into the chair.  Reflexes: Deep tendon reflexes are symmetric, but are depressed bilaterally. Toes are downgoing bilaterally.   Assessment/Plan:  1.  Critical illness myopathy/neuropathy  The patient has a combination of a myopathic disorder and neuropathic disorder associated with his Covid infection.  The recovery time is likely to be protracted, but he has already made significant gains just in the last 2 months.  Full recovery may take a year and 1/2 to 2 years, he may be left with some residual numbness.  The prognosis for recovery is somewhat better with the critical illness myopathy than it is with the neuropathy.  EMG and nerve conduction study could be done but I am not clear that this would alter any medical therapy.  Treatment is with maintenance of good nutrition and ongoing physical and occupational therapy.  He will follow-up here in about 4 months.  If his lancinating pains in the feet are significantly bothersome, gabapentin can be added in the evening.  Jill Alexanders MD 01/31/2020 8:45 AM  [facility] [address] [place], [place] [address]  Phone [phone] Fax [phone]

## 2020-02-06 ENCOUNTER — Ambulatory Visit: Payer: Managed Care, Other (non HMO) | Admitting: Orthopedic Surgery

## 2020-02-17 ENCOUNTER — Other Ambulatory Visit: Payer: Self-pay

## 2020-02-17 ENCOUNTER — Encounter: Payer: Self-pay | Admitting: Physical Medicine and Rehabilitation

## 2020-02-17 ENCOUNTER — Encounter
Payer: Managed Care, Other (non HMO) | Attending: Physical Medicine and Rehabilitation | Admitting: Physical Medicine and Rehabilitation

## 2020-02-17 VITALS — BP 114/78 | HR 73 | Temp 98.1°F | Ht 68.0 in | Wt 267.6 lb

## 2020-02-17 DIAGNOSIS — G7281 Critical illness myopathy: Secondary | ICD-10-CM

## 2020-02-17 DIAGNOSIS — R234 Changes in skin texture: Secondary | ICD-10-CM | POA: Diagnosis not present

## 2020-02-17 DIAGNOSIS — J45998 Other asthma: Secondary | ICD-10-CM | POA: Diagnosis not present

## 2020-02-17 DIAGNOSIS — R269 Unspecified abnormalities of gait and mobility: Secondary | ICD-10-CM | POA: Diagnosis present

## 2020-02-17 MED ORDER — METOPROLOL TARTRATE 25 MG PO TABS
25.0000 mg | ORAL_TABLET | Freq: Two times a day (BID) | ORAL | 3 refills | Status: DC
Start: 2020-02-17 — End: 2020-06-08

## 2020-02-17 MED ORDER — DULOXETINE HCL 30 MG PO CPEP
30.0000 mg | ORAL_CAPSULE | Freq: Every day | ORAL | 5 refills | Status: DC
Start: 2020-02-17 — End: 2020-03-06

## 2020-02-17 NOTE — Progress Notes (Signed)
Subjective:    Patient ID: [name], male    DOB: 1975/12/06, 44 y.o.   MRN: [id number]  HPI   Pt is a 44 yr old s/p COVID with ICU myopathy here for f/u.  Dr Sharol Given said no betadine and no dressings and to use a particular compression stockings that "he invented"  To see [facility] who is podiatrist and does wound care. Will stop seeing Dr Sharol Given.    Is on 3 BP meds- wondering if he needs to be on all 3.  BP is running well.   Is on Trazodone, 100 mg  Lexapro- 20 mg  And Klonopin 1 mg QHS. Wondering if can wean them.    Trach site closed on its own- didn't need chemical stick to close it.   Pain- in L ankle- shooting pain down LLE to L foot-  On outside of ankle- shooting, sharp, no pins and needles  Takes Norco- but just tolerates pain during the day- comes and goes- takes Norco at night so can sleep.      Pain Inventory Average Pain 5 Pain Right Now 5 My pain is sharp and stabbing  In the last 24 hours, has pain interfered with the following? General activity 2 Relation with others 2 Enjoyment of life 2 What TIME of day is your pain at its worst? evening Sleep (in general) Fair  Pain is worse with: walking, sitting and standing Pain improves with: rest, therapy/exercise and medication Relief from Meds: 5  Mobility use a walker how many minutes can you walk? 5 ability to climb steps?  no do you drive?  yes use a wheelchair transfers alone  Function employed # of hrs/week . I need assistance with the following:  meal prep, household duties and shopping  Neuro/Psych weakness numbness tingling trouble walking  Prior Studies x-rays  Physicians involved in your care Any changes since last visit?  no Neurologist .   Family History  Problem Relation Age of Onset  . Asthma Brother   . Diabetes Father   . Hypertension Father   . Diabetes Paternal Grandmother   . Hypertension Paternal Grandmother   . Migraines Mother   . GER  disease Mother   . Pancreatic cancer Maternal Grandmother   . Heart disease Maternal Grandfather    Social History   Socioeconomic History  . Marital status: Married    Spouse name: Not on file  . Number of children: 1  . Years of education: Not on file  . Highest education level: Not on file  Occupational History  . Occupation: delivery driver  Tobacco Use  . Smoking status: Never Smoker  . Smokeless tobacco: Never Used  Vaping Use  . Vaping Use: Never used  Substance and Sexual Activity  . Alcohol use: Yes    Comment: rarely 2-3 times a year  . Drug use: No  . Sexual activity: Yes    Partners: Female  Other Topics Concern  . Not on file  Social History Narrative  . Not on file   Social Determinants of Health   Financial Resource Strain:   . Difficulty of Paying Living Expenses:   Food Insecurity:   . Worried About Charity fundraiser in the Last Year:   . Arboriculturist in the Last Year:   Transportation Needs:   . Film/video editor (Medical):   Marland Kitchen Lack of Transportation (Non-Medical):   Physical Activity:   . Days of  Exercise per Week:   . Minutes of Exercise per Session:   Stress:   . Feeling of Stress :   Social Connections:   . Frequency of Communication with Friends and Family:   . Frequency of Social Gatherings with Friends and Family:   . Attends Religious Services:   . Active Member of Clubs or Organizations:   . Attends Archivist Meetings:   Marland Kitchen Marital Status:    Past Surgical History:  Procedure Laterality Date  . CANNULATION FOR ECMO (EXTRACORPOREAL MEMBRANE OXYGENATION) N/A 08/28/2019   Procedure: CANNULATION FOR VV ECMO (EXTRACORPOREAL MEMBRANE OXYGENATION);  Surgeon: Prescott Gum, Collier Salina, MD;  Location: [place];  Service: Open Heart Surgery;  Laterality: N/A;  CRESCENT CANNULA  . CANNULATION FOR ECMO (EXTRACORPOREAL MEMBRANE OXYGENATION) N/A 09/10/2019   Procedure: CANNULATION FOR ECMO (EXTRACORPOREAL MEMBRANE OXYGENATION) PUTTING IN  CRESCENT 32FR CANNULA  AND REMOVING GROING CANNULATION;  Surgeon: Wonda Olds, MD;  Location: [place];  Service: Open Heart Surgery;  Laterality: N/A;  PUTTING IN CRESCENT/REMOVING GROIN CANNULATION  . CYSTOSCOPY/URETEROSCOPY/HOLMIUM LASER/STENT PLACEMENT Left 04/18/2019   Procedure: LEFT URETEROSCOPY/HOLMIUM LASER/STENT PLACEMENT;  Surgeon: Ardis Hughs, MD;  [facility];  Service: Urology;  Laterality: Left;  . CYSTOSCOPY/URETEROSCOPY/HOLMIUM LASER/STENT PLACEMENT Left 05/02/2019   Procedure: CYSTOSCOPY/URETEROSCOPY/HOLMIUM LASER/STENT EXCHANGE;  Surgeon: Ardis Hughs, MD;  [facility];  Service: Urology;  Laterality: Left;  . ECMO CANNULATION N/A 08/03/2019   Procedure: ECMO CANNULATION;  Surgeon: Wonda Olds, MD;  Location: [place] [facility];  Service: Cardiovascular;  Laterality: N/A;  . ESOPHAGOGASTRODUODENOSCOPY N/A 09/11/2019   Procedure: ESOPHAGOGASTRODUODENOSCOPY (EGD);  Surgeon: Wonda Olds, MD;  Location: [place];  Service: Thoracic;  Laterality: N/A;  . IR [place] Vivianne Master  10/14/2019  . IRRIGATION AND DEBRIDEMENT SHOULDER Right 09/29/2017   Procedure: IRRIGATION AND DEBRIDEMENT SHOULDER;  Surgeon: Leandrew Koyanagi, MD;  Location: [place];  Service: Orthopedics;  Laterality: Right;  . [place] SURGERY  2002  . NASAL ENDOSCOPY WITH EPISTAXIS CONTROL N/A 08/31/2019   Procedure: NASAL ENDOSCOPY WITH EPISTAXIS CONTROL WITH CAUTERIZATION;  Surgeon: Melida Quitter, MD;  Location: [place];  Service: ENT;  Laterality: N/A;  . PORTACATH PLACEMENT N/A 09/11/2019   Procedure: PEG TUBE INSERTION - BEDSIDE;  Surgeon: Wonda Olds, MD;  Location: [place];  Service: Thoracic;  Laterality: N/A;  . TEE WITHOUT CARDIOVERSION N/A 08/28/2019   Procedure: TRANSESOPHAGEAL ECHOCARDIOGRAM (TEE);  Surgeon: Prescott Gum, Collier Salina, MD;  Location: [place];  Service: Open Heart Surgery;  Laterality: N/A;  . TEE WITHOUT CARDIOVERSION N/A 09/10/2019    Procedure: TRANSESOPHAGEAL ECHOCARDIOGRAM (TEE);  Surgeon: Wonda Olds, MD;  Location: [place];  Service: Open Heart Surgery;  Laterality: N/A;   Past Medical History:  Diagnosis Date  . Asthma   . GERD (gastroesophageal reflux disease)   . Headache   . History of kidney stones    LEFT URETERAL STONE  . HTN (hypertension)   . Pancreatitis 2018   GALLBALDDER SLUDGE CAUSED ISSUED RESOLVED   BP 114/78   Pulse 73   Temp 98.1 F (36.7 C)   Ht 5' 8"  (1.727 m)   Wt 267 lb 9.6 oz (121.4 kg)   SpO2 93%   BMI 40.69 kg/m   Opioid Risk Score:   Fall Risk Score:  `1  Depression screen PHQ 2/9  Depression screen [facility] 2/9 01/03/2020 12/12/2017 11/01/2017  Decreased Interest 0 0 0  Down, Depressed, Hopeless 1 0 0  PHQ - 2 Score 1 0 0  Altered sleeping 1 - -  Tired, decreased energy 2 - -  Change in appetite 0 - -  Feeling bad or failure about yourself  1 - -  Trouble concentrating 0 - -  Moving slowly or fidgety/restless 1 - -  Suicidal thoughts 0 - -  PHQ-9 Score 6 - -  Difficult doing work/chores Somewhat difficult - -    Review of Systems    an entire ROS was completed and negative except HPI.  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 Needs handicapped placard. Will fill out today.   2. Think getting COVID vaccine is good, esp due ot Delta variant- did go over, could cause increased Symptoms for 3-5 days after injection.   3. Clonazepam- can wean to 1/2 tab x 1 week if tolerated; then use as needed for sleep. Goal to come off of it.   4. Continue Escitalopram and Trazodone for now- but can try  to wean Trazodone--can decrease to 1/2 tab nightly- for sleep.  Don't change until off Clonazepam.   5. Don't think needs -lift chair-  could become dependent on it.  6.  Change Carvedilol to Metoprolol 25 mg 2x/day - to reduce chance of bronchospasms- can wait til done with current Rx to switch since not using inhaler a lot.   7. Suggest PT outpatient when done with H/H- and needs E stim on RUE and DF/PF of feet-keep going with home exercises at home for wrist until moves to outpatient.   8. Cymbalta- Duloxetine. .  Duloxetine /Cymbalta 30 mg nightly x 1 week  Then 60 mg nightly- for nerve pain  1% of patients can have nausea with Duloxetine- call me if needs an anti-nausea medicine. Can also cause mild dry mouth/dry eyes and mild constipation. Can take in morning if makes you not sleep. STOP ESCITALOPRAM- because switching to Duloxetine.    9. To help with sleep- 5 hours before bed, stop caffeine; and 1 hour before bed, stop screen time.   10. F/U in 2 months  I spent a total of 50 minutes on appointment- as detailed above.

## 2020-02-17 NOTE — Patient Instructions (Signed)
Pt is a 44 yr old male with COVID ICU myopathy  1. Needs handicapped placard. Will fill out today.   2. Think getting COVID vaccine is good, esp due ot Delta variant- did go over, could cause increased Symptoms for 3-5 days after injection.   3. Clonazepam- can wean to 1/2 tab x 1 week if tolerated; then use as needed for sleep. Goal to come off of it.   4. Continue Escitalopram and Trazodone for now- but can try to wean Trazodone--can decrease to 1/2 tab nightly- for sleep.  Don't change until off Clonazepam.   5. Don't think needs -lift chair-  could become dependent on it.  6.  Change Carvedilol to Metoprolol 25 mg 2x/day - to reduce chance of bronchospasms- can wait til done with current Rx to switch since not using inhaler a lot.   7. Suggest PT outpatient when done with H/H- and needs E stim on RUE and DF/PF of feet-keep going with home exercises at home for wrist until moves to outpatient.   8. Cymbalta- Duloxetine. .  Duloxetine /Cymbalta 30 mg nightly x 1 week  Then 60 mg nightly- for nerve pain  1% of patients can have nausea with Duloxetine- call me if needs an anti-nausea medicine. Can also cause mild dry mouth/dry eyes and mild constipation. Can take in morning if makes you not sleep. STOP ESCITALOPRAM- because switching to Duloxetine.    9. To help with sleep- 5 hours before bed, stop caffeine; and 1 hour before bed, stop screen time.   10. F/U in 2 months

## 2020-02-18 ENCOUNTER — Telehealth: Payer: Self-pay

## 2020-02-18 NOTE — Telephone Encounter (Signed)
Called pt- I was unclear- he is to stop Escitalopram and Start Duloxetine to replace it- for mood AND nerve pain- pt voiced understanding and has no more questions.   Thank you, ML

## 2020-02-18 NOTE — Telephone Encounter (Signed)
Mr. Clock called:   Patient seeking clarity on his medication instructions. He is having a problem understanding steps 4 & 8 on his plan (Rx Escitalopram).    Please advise.  Thank you.

## 2020-02-19 ENCOUNTER — Other Ambulatory Visit: Payer: Self-pay

## 2020-02-19 ENCOUNTER — Encounter: Payer: Self-pay | Admitting: Podiatry

## 2020-02-19 ENCOUNTER — Ambulatory Visit: Payer: Managed Care, Other (non HMO) | Admitting: Podiatry

## 2020-02-19 DIAGNOSIS — L97513 Non-pressure chronic ulcer of other part of right foot with necrosis of muscle: Secondary | ICD-10-CM | POA: Diagnosis not present

## 2020-02-19 DIAGNOSIS — L89890 Pressure ulcer of other site, unstageable: Secondary | ICD-10-CM

## 2020-02-19 DIAGNOSIS — L97523 Non-pressure chronic ulcer of other part of left foot with necrosis of muscle: Secondary | ICD-10-CM

## 2020-02-20 ENCOUNTER — Telehealth: Payer: Self-pay | Admitting: Podiatry

## 2020-02-20 NOTE — Telephone Encounter (Signed)
Inez Catalina from [place] [facility] called needed wound care and home health info please contact betty duncan @ [phone] ext [phone]

## 2020-02-25 NOTE — Telephone Encounter (Signed)
Tedra Senegal. states she will send to Patient Care Coordinator [place] states she will transfer to Mirian Mo phone to leave a message. Inez Catalina Duncan's phone extension rang for over 1 minute, then went silent, I left a message informing her that I needed more information on the pt.

## 2020-02-26 ENCOUNTER — Other Ambulatory Visit: Payer: Self-pay

## 2020-02-26 ENCOUNTER — Encounter: Payer: Self-pay | Admitting: Podiatry

## 2020-02-26 ENCOUNTER — Ambulatory Visit: Payer: Managed Care, Other (non HMO) | Admitting: Podiatry

## 2020-02-26 DIAGNOSIS — L97523 Non-pressure chronic ulcer of other part of left foot with necrosis of muscle: Secondary | ICD-10-CM | POA: Diagnosis not present

## 2020-02-26 DIAGNOSIS — L97513 Non-pressure chronic ulcer of other part of right foot with necrosis of muscle: Secondary | ICD-10-CM | POA: Diagnosis not present

## 2020-02-26 DIAGNOSIS — L89890 Pressure ulcer of other site, unstageable: Secondary | ICD-10-CM

## 2020-02-26 NOTE — Progress Notes (Signed)
   HPI: 44 y.o. male presenting today with his wife for evaluation of pressure ulcers that have developed to the bilateral feet.  Patient states that he was in the hospital for 5 months in a medically induced coma mechanical ventilation for complications associated with COVID-19.  The wife states that his O2 saturations were optimal when he was in an upright position so they strapped him to the hospital bed and the bed was lifted at a 90 degree angle.  This caused pressure to his feet that was constant.  He eventually developed full-thickness pressure ulcers of the bilateral feet.  He presents today to have them addressed and for further treatment and evaluation  Past Medical History:  Diagnosis Date  . Asthma   . GERD (gastroesophageal reflux disease)   . Headache   . History of kidney stones    LEFT URETERAL STONE  . HTN (hypertension)   . Pancreatitis 2018   GALLBALDDER SLUDGE CAUSED ISSUED RESOLVED     Physical Exam: General: The patient is alert and oriented x3 in no acute distress.  Dermatology: Full-thickness pressure ulcers noted to the bilateral weightbearing surfaces of the feet across the entire sole of the foot.  There is a well adhered thick eschar noted.  Minimal drainage.  Minimal malodor noted.  Periwound integrity is intact.  There is no erythema noted.  The full-thickness pressure ulcers do appear to be stable and chronic at the moment.  Vascular: Palpable pedal pulses bilaterally.  There is some moderate edema edema noted. Capillary refill within normal limits.  Neurological: Epicritic and protective threshold grossly intact bilaterally.   Musculoskeletal Exam: Range of motion within normal limits to all pedal and ankle joints bilateral. Muscle strength 5/5 in all groups bilateral.   Assessment: 1.  Full-thickness pressure ulcers, unstageable, with overlying eschar 2.  Pain in bilateral feet 3.  History of COVID-19 hospitalization.  Discharge 12/04/2019   Plan of  Care:  1. Patient evaluated.  2.  I explained to the patient that debridement needs to be performed to the hospital setting.  These are extensive ulcers with a thick overlying eschar. 3.  Resume Betadine daily.  Apparently he was recommended at 1 point to wear compression socks.  Discontinue compression and recommend daily application of Betadine with dry sterile dressings 4. Today we discussed the surgical management of the presenting pathology which would include extensive debridement with application of skin graft. The patient opts for surgical management. All possible complications and details of the procedure were explained. All patient questions were answered. No guarantees were expressed or implied. 3. Authorization for surgery was initiated today. Surgery will consist of excisional debridement of ulcers bilateral feet with application of Acell and Integra bilayer wound matrix.  4.  Return to clinic 1 week postop        Edrick Kins, DPM [facility]  Dr. Edrick Kins, DPM    [address], [place] [address]                Office [phone]  Fax [phone]

## 2020-02-26 NOTE — Progress Notes (Signed)
   HPI: 44 y.o. male presenting today with his wife for evaluation of pressure ulcers that have developed to the bilateral feet.  Patient states that he was in the hospital for 5 months in a medically induced coma mechanical ventilation for complications associated with COVID-19.  The wife states that his O2 saturations were optimal when he was in an upright position so they strapped him to the hospital bed and the bed was lifted at a 90 degree angle.  This caused pressure to his feet that was constant.  He eventually developed full-thickness pressure ulcers of the bilateral feet.    No change since last visit and the patient and spouse were wondering if that she come in weekly for dressing changes  Past Medical History:  Diagnosis Date  . Asthma   . GERD (gastroesophageal reflux disease)   . Headache   . History of kidney stones    LEFT URETERAL STONE  . HTN (hypertension)   . Pancreatitis 2018   GALLBALDDER SLUDGE CAUSED ISSUED RESOLVED         Physical Exam: General: The patient is alert and oriented x3 in no acute distress.  Dermatology: Full-thickness pressure ulcers noted to the bilateral weightbearing surfaces of the feet across the entire sole of the foot.  There is a well adhered thick eschar noted.  Minimal drainage.  Minimal malodor noted.  Periwound integrity is intact.  There is no erythema noted.  The full-thickness pressure ulcers do appear to be stable and chronic at the moment.  Vascular: Palpable pedal pulses bilaterally.  There is some moderate edema edema noted. Capillary refill within normal limits.  Neurological: Epicritic and protective threshold grossly intact bilaterally.   Musculoskeletal Exam: Range of motion within normal limits to all pedal and ankle joints bilateral. Muscle strength 5/5 in all groups bilateral.   Assessment: 1.  Full-thickness pressure ulcers, unstageable, with overlying eschar 2.  Pain in bilateral feet 3.  History of COVID-19  hospitalization.  Discharge 12/04/2019   Plan of Care:  1. Patient evaluated.  2.  Patient scheduled for surgical debridement on 03/27/2020.  In the meantime continue Betadine wet-to-dry dressings daily 3.  Return to clinic 1 week postop, or if she notices any change or signs of infection to the ulcerations which would include malodor, increased pain, increased swelling and redness.     Edrick Kins, DPM [facility]  Dr. Edrick Kins, DPM    [address], [place] [address]                Office [phone]  Fax [phone]

## 2020-02-26 NOTE — H&P (View-Only) (Signed)
   HPI: 44 y.o. male presenting today with his wife for evaluation of pressure ulcers that have developed to the bilateral feet.  Patient states that he was in the hospital for 5 months in a medically induced coma mechanical ventilation for complications associated with COVID-19.  The wife states that his O2 saturations were optimal when he was in an upright position so they strapped him to the hospital bed and the bed was lifted at a 90 degree angle.  This caused pressure to his feet that was constant.  He eventually developed full-thickness pressure ulcers of the bilateral feet.    No change since last visit and the patient and spouse were wondering if that she come in weekly for dressing changes  Past Medical History:  Diagnosis Date  . Asthma   . GERD (gastroesophageal reflux disease)   . Headache   . History of kidney stones    LEFT URETERAL STONE  . HTN (hypertension)   . Pancreatitis 2018   GALLBALDDER SLUDGE CAUSED ISSUED RESOLVED         Physical Exam: General: The patient is alert and oriented x3 in no acute distress.  Dermatology: Full-thickness pressure ulcers noted to the bilateral weightbearing surfaces of the feet across the entire sole of the foot.  There is a well adhered thick eschar noted.  Minimal drainage.  Minimal malodor noted.  Periwound integrity is intact.  There is no erythema noted.  The full-thickness pressure ulcers do appear to be stable and chronic at the moment.  Vascular: Palpable pedal pulses bilaterally.  There is some moderate edema edema noted. Capillary refill within normal limits.  Neurological: Epicritic and protective threshold grossly intact bilaterally.   Musculoskeletal Exam: Range of motion within normal limits to all pedal and ankle joints bilateral. Muscle strength 5/5 in all groups bilateral.   Assessment: 1.  Full-thickness pressure ulcers, unstageable, with overlying eschar 2.  Pain in bilateral feet 3.  History of COVID-19  hospitalization.  Discharge 12/04/2019   Plan of Care:  1. Patient evaluated.  2.  Patient scheduled for surgical debridement on 03/27/2020.  In the meantime continue Betadine wet-to-dry dressings daily 3.  Return to clinic 1 week postop, or if she notices any change or signs of infection to the ulcerations which would include malodor, increased pain, increased swelling and redness.     Edrick Kins, DPM [facility]  Dr. Edrick Kins, DPM    [address], [place] [address]                Office [phone]  Fax [phone]

## 2020-03-04 ENCOUNTER — Telehealth: Payer: Self-pay | Admitting: Physical Medicine and Rehabilitation

## 2020-03-04 NOTE — Telephone Encounter (Signed)
They received a letter from the insurance company that they need a 90 day prescription.

## 2020-03-05 NOTE — Telephone Encounter (Signed)
Sorry, but the patient is on multiple meds- I need to know which ones need to be 90 days- I cannot figure this out from this note.

## 2020-03-06 ENCOUNTER — Telehealth: Payer: Self-pay

## 2020-03-06 MED ORDER — DULOXETINE HCL 60 MG PO CPEP: 60.0000 mg | ORAL_CAPSULE | Freq: Every day | ORAL | 3 refills | Status: DC

## 2020-03-06 MED ORDER — TRAZODONE HCL 100 MG PO TABS: 100.0000 mg | ORAL_TABLET | Freq: Every day | ORAL | 3 refills | Status: DC

## 2020-03-06 MED ORDER — ESCITALOPRAM OXALATE 20 MG PO TABS: 20.0000 mg | ORAL_TABLET | Freq: Every day | ORAL | 3 refills | Status: DC

## 2020-03-06 MED ORDER — TAMSULOSIN HCL 0.4 MG PO CAPS: 0.4000 mg | ORAL_CAPSULE | Freq: Every day | ORAL | 3 refills | Status: DC

## 2020-03-06 NOTE — Telephone Encounter (Signed)
DOS 03/27/2020  WOUND DEBRIDEMENT B/L - 75300 APPLICATION OF SKIN GRAFT B/L - 15275  [employer] EFFECTIVE DATE - 12/07/2019  PLAN DEDUCTIBLE - $1750.00 W/ $0.00 REMAINING OUT OF POCKET - $4500.00 W/ $0.00 REMAINING COPAY $0.00 COINSURANCE - 100%  PER AUTOMATED SYSTEM NO PRECERT REQUIRED FOR CPT 11043 - CONF # [id number]  PRECERT REQUIRED FOR CPT 11173 CALL REF # [id number] 02/27/20 12:27 PM.   FAXED CLINICALS TO [employer] 02/27/20. [place] CPT [phone]. AUTH # [id number] GOOD FROM 8/20/201 - 06/27/2020

## 2020-03-06 NOTE — Telephone Encounter (Signed)
Will fill Tamsulosin (flomax), Duloxetine, Trazodone, and and Lexapro.   The other medications should be filled by PCP - they are not mine to fill.

## 2020-03-06 NOTE — Telephone Encounter (Signed)
Medication on his list that the insurance company sent was Duloxetine, Trazadone, Spironolactone, Tamulosin, Carvedilol, Amlodipine, Singulair, Lexapro. Some those medications has been prescribed from another provider and I did let her know. Can you let us know which ones you will be willing to give him a 90 day supply on?

## 2020-03-06 NOTE — Addendum Note (Signed)
Addended by: Courtney Heys on: 03/06/2020 03:41 PM   Modules accepted: Orders

## 2020-03-09 ENCOUNTER — Other Ambulatory Visit: Payer: Self-pay

## 2020-03-09 ENCOUNTER — Encounter: Payer: Self-pay | Admitting: Pulmonary Disease

## 2020-03-09 ENCOUNTER — Ambulatory Visit: Payer: Managed Care, Other (non HMO) | Admitting: Pulmonary Disease

## 2020-03-09 VITALS — BP 128/70 | HR 87 | Temp 98.4°F | Ht 68.0 in | Wt 267.4 lb

## 2020-03-09 DIAGNOSIS — J8409 Other alveolar and parieto-alveolar conditions: Secondary | ICD-10-CM

## 2020-03-09 DIAGNOSIS — J4540 Moderate persistent asthma, uncomplicated: Secondary | ICD-10-CM

## 2020-03-09 DIAGNOSIS — J309 Allergic rhinitis, unspecified: Secondary | ICD-10-CM | POA: Diagnosis not present

## 2020-03-09 DIAGNOSIS — H1010 Acute atopic conjunctivitis, unspecified eye: Secondary | ICD-10-CM

## 2020-03-09 HISTORY — DX: Other alveolar and parieto-alveolar conditions: J84.09

## 2020-03-09 NOTE — Progress Notes (Signed)
Patient ID: [name], male    DOB: [birth year], 44 y.o.   MRN: [id number]  Chief Complaint  Patient presents with  . Consult    Pt was admitted to the hospital 07/22/19 due to covid pneumonia and was discharged 4/28. Pt states he is slowly getting better each day.    Referring provider: Michael Boston, MD  HPI:  [name] is a 44 y.o. man with history of asthma and extensive ~4 month hospitalization with COVID19 ARDS including 2 months on VV ECMO with tracheostomy s/p decannulation whom we are seeing in consultation at the request of Wile, [name] Sans, MD for evaluation of respiratory failure.  Multiple notes reviewed in EMR pertaining to prolonged hospitalization. Further details documented there.   His chief complaint is shortness of breath. Present since discharge from hospital late April 2021. Today he reports slow but he thinks gradual improvement over time. Still winded with relatively minimal activity especially inclines/stairs. Severity described as moderate. But during conversation he reports working in yard around house at a slow pace and his wife mentions going for a walk. So his exertional capacity is improving but slowly. 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 He endorses some cough in evening. Not productive then but does produce a small (quarter or so size) amount of dark yellow to brown sputum. No cough during the day. No chest pain with exertion. Feels symptoms improve with once daily Symbicort. Uses albuterol in the evenings multiple times a week. Unsure if it helps, doesn't see much benefit. Thinks it may be habit. Wife agrees. Mild to minimal LE edema managed with lasix.   Has critical illness myopathy. Starting outpatient PT soon per report. He is being treated for foot ulcers as a result of prolonged immobilization in ICU. Planning what sounds like surgical debridement in the future  PMH: asthma,  obesity Surgical History: ECMO cannulation, shoulder debridement 2019 Family History: asthma in brother, DM and HTN in father Social History: Lives in [place] with wife, son, worked in Proof[employer] prior to [employer], never smoker   Licensed conveyancer / Pulmonary Flowsheets:   ACT:  Asthma Control Test ACT Total Score  07/16/2019 23  05/28/2019 21  02/06/2018 20    Imaging: Personally reviewed with interpretation as follows: Most recent CXR 10/27/2019: Low lung volumes, bilateral diffuse infiltrates most dense in center of chest Most recent CT Chest 09/05/2019: Dense centrally distributed infiltrates with scattered GGOs in bilateral upper lobes and anterior portions of bilateral lungs  Lab Results: Personally Reviewed, Eos 300 CBC    Component Value Date/Time   WBC 8.4 12/02/2019 0628   RBC 4.87 12/02/2019 0628   HGB 13.2 12/02/2019 0628   HCT 42.7 12/02/2019 0628   PLT 178 12/02/2019 0628   MCV 87.7 12/02/2019 0628   MCH 27.1 12/02/2019 0628   MCHC 30.9 12/02/2019 0628   RDW 14.4 12/02/2019 0628   LYMPHSABS 2.0 12/02/2019 0628   MONOABS 0.7 12/02/2019 0628   EOSABS 0.3 12/02/2019 0628   BASOSABS 0.1 12/02/2019 0628    BMET    Component Value Date/Time   NA 137 12/02/2019 0628   K 4.4 12/02/2019 0628   CL 98 12/02/2019 0628   CO2 29 12/02/2019 0628   GLUCOSE 98 12/02/2019 0628   BUN 33 (H) 12/02/2019 0628   CREATININE 0.93 12/02/2019 0628   CALCIUM 9.7 12/02/2019 0628   GFRNONAA >60 12/02/2019  4496   GFRAA >60 12/02/2019 0628    BNP    Component Value Date/Time   BNP 15.3 07/22/2019 1039    ProBNP No results found for: PROBNP  Specialty Problems      Pulmonary Problems   Allergic rhinoconjunctivitis   Moderate persistent asthma   Diffuse alveolar damage (HCC)      No Known Allergies  Immunization History  Administered Date(s) Administered  . PFIZER SARS-COV-2 Vaccination 02/18/2020    Past Medical History:  Diagnosis Date  . Asthma   .  GERD (gastroesophageal reflux disease)   . Headache   . History of kidney stones    LEFT URETERAL STONE  . HTN (hypertension)   . Pancreatitis 2018   GALLBALDDER SLUDGE CAUSED ISSUED RESOLVED    Tobacco History: Social History   Tobacco Use  Smoking Status Never Smoker  Smokeless Tobacco Never Used   Counseling given: Not Answered    Outpatient Encounter Medications as of 03/09/2020  Medication Sig  . acetaminophen (TYLENOL) 325 MG tablet Place 2 tablets (650 mg total) into feeding tube every 6 (six) hours as needed for mild pain or headache (fever >/= 101).  Marland Kitchen albuterol (VENTOLIN HFA) 108 (90 Base) MCG/ACT inhaler Inhale 2 puffs into the lungs every 6 (six) hours as needed for wheezing or shortness of breath.  Marland Kitchen amLODipine (NORVASC) 2.5 MG tablet Take 1 tablet (2.5 mg total) by mouth daily.  . carvedilol (COREG) 25 MG tablet Take 1 tablet (25 mg total) by mouth 2 (two) times daily with a meal.  . cetirizine (ZYRTEC) 10 MG tablet Take 1 tablet (10 mg total) by mouth daily.  . clonazePAM (KLONOPIN) 1 MG tablet Take 1 mg by mouth at bedtime.  . DULoxetine (CYMBALTA) 60 MG capsule Take 1 capsule (60 mg total) by mouth daily.  . fluticasone (FLONASE) 50 MCG/ACT nasal spray Place 2 sprays into both nostrils daily as needed for allergies or rhinitis.  Marland Kitchen HYDROcodone-acetaminophen (NORCO/VICODIN) 5-325 MG tablet Take 1 tablet by mouth every 6 (six) hours as needed for moderate pain.  . montelukast (SINGULAIR) 10 MG tablet Take 1 tablet (10 mg total) by mouth at bedtime.  . pantoprazole (PROTONIX) 40 MG tablet Take 1 tablet (40 mg total) by mouth daily.  Marland Kitchen spironolactone (ALDACTONE) 25 MG tablet Take 1 tablet (25 mg total) by mouth daily.  . SYMBICORT 160-4.5 MCG/ACT inhaler Inhale 2 puffs into the lungs daily as needed (cough or wheeze). Inhale two puffs ONCE daily to prevent cough or wheeze. Rinse, gargle, and spit after use. (Patient taking differently: Inhale 2 puffs into the lungs daily.  I)  . tamsulosin (FLOMAX) 0.4 MG CAPS capsule Take 1 capsule (0.4 mg total) by mouth daily after supper.  . traZODone (DESYREL) 100 MG tablet Take 1 tablet (100 mg total) by mouth at bedtime.  Marland Kitchen escitalopram (LEXAPRO) 20 MG tablet Take 1 tablet (20 mg total) by mouth daily. (Patient not taking: Reported on 03/09/2020)  . GUAIFENESIN ER PO Take 600 mg by mouth every 12 (twelve) hours. (Patient not taking: Reported on 03/03/2020)  . Melatonin 10 MG TABS Take 10 mg by mouth at bedtime as needed. (Patient not taking: Reported on 03/03/2020)  . metoprolol tartrate (LOPRESSOR) 25 MG tablet Take 1 tablet (25 mg total) by mouth 2 (two) times daily. (Patient not taking: Reported on 03/09/2020)  . [DISCONTINUED] escitalopram (LEXAPRO) 20 MG tablet Take 1 tablet (20 mg total) by mouth daily.  . [DISCONTINUED] HYDROcodone-acetaminophen (NORCO/VICODIN) 5-325 MG tablet Take  1 tablet by mouth every 6 (six) hours as needed for moderate pain.  . [DISCONTINUED] polycarbophil (FIBERCON) 625 MG tablet Take 1 tablet (625 mg total) by mouth daily. (Patient not taking: Reported on 03/03/2020)  . [DISCONTINUED] saccharomyces boulardii (FLORASTOR) 250 MG capsule Take 1 capsule (250 mg total) by mouth 2 (two) times daily. (Patient not taking: Reported on 03/03/2020)  . [DISCONTINUED] tamsulosin (FLOMAX) 0.4 MG CAPS capsule Take 1 capsule (0.4 mg total) by mouth daily after supper.  . [DISCONTINUED] triamcinolone (NASACORT ALLERGY 24HR) 55 MCG/ACT AERO nasal inhaler Place 2 sprays into the nose daily. (Patient not taking: Reported on 03/03/2020)   No facility-administered encounter medications on file as of 03/09/2020.     Review of Systems  Review of Systems  No orthopnea or PND  Physical Exam  BP 128/70 (BP Location: Right Arm, Cuff Size: Large)   Pulse 87   Temp 98.4 F (36.9 C) (Oral)   Ht 5' 8"  (1.727 m)   Wt (!) 267 lb 6.4 oz (121.3 kg)   SpO2 90%   BMI 40.66 kg/m   Wt Readings from Last 5 Encounters:  03/09/20  (!) 267 lb 6.4 oz (121.3 kg)  02/17/20 267 lb 9.6 oz (121.4 kg)  01/31/20 262 lb (118.8 kg)  01/16/20 245 lb (111.1 kg)  01/03/20 245 lb (111.1 kg)    BMI Readings from Last 5 Encounters:  03/09/20 40.66 kg/m  02/17/20 40.69 kg/m  01/31/20 39.84 kg/m  01/16/20 37.25 kg/m  01/03/20 37.25 kg/m     Physical Exam General: Sitting in wheel chair, in NAD Eyes: EOMI, no icterys Neck: Linear scars over right neck, tracheostomy site is closed with well healing scar Respiratory: CTAB, good air movement throughout Cardiovascular: RRR, no murmurs Abdomen: BS present, non tender MSK: soft cushioned boots bilaterally, bilateral feet wrapped in ACE badage, toes exposed, right wrist drop Neuro: Weakness to flexion/extension of bilateral feet, R worse than left, left wrist extension 3/5 Psych: Normal mood, full affect   Assessment & Plan:   Mr. NERI SAMEK is a 44 y.o. man with history of asthma and extensive ~4 month hospitalization with COVID19 ARDS including 2 months on VV ECMO with tracheostomy s/p decannulation whom we are seeing in consultation at the request of Michael Boston, MD for evaluation of respiratory failure.  Shortness of breath/DOE: Likely a sequelae of ARDS and subsequent evidence of DAD on CT scans and evolving lung injury. Likely component of NM weakness and deconditioning given prolonged hospital stay and known critical illness myopathy. Possible his asthma is not as well controlled as once thought. Oxygen borderline today. --Completing COVID vaccine this week --Encouraged rehab --PFTs, CXR at next visit to evaluate radiographic appearance of lungs and lung function  Asthma: Was well controlled prior. Possible some SOB and his morning cough related to asthma. Taking Symbicort once a day. Recommend using 2 puffs BID and reassess symptoms.   Return in about 2 months (around 05/09/2020).   Lanier Clam, MD 03/09/2020

## 2020-03-09 NOTE — Patient Instructions (Addendum)
Nice to meet you!  Use Symbicort 2 puffs twice a day to see if this gives you more wind and stamina. It may help your morning cough, as well. This is to make sure we are aggressively treating your asthma.   Come back in 2 months for PFTs and to see Dr. Silas Flood. We will get a chest Xray then, as well.

## 2020-03-13 ENCOUNTER — Telehealth: Payer: Self-pay | Admitting: Podiatry

## 2020-03-13 NOTE — Telephone Encounter (Signed)
[facility] called and would like to discuss plan of care please call [phone] karen redick pt

## 2020-03-13 NOTE — Telephone Encounter (Signed)
[facility] - [name] states she is calling to get pt's weight bearing status, DOS 03/26/2020 or 03/27/2020.

## 2020-03-13 NOTE — Telephone Encounter (Signed)
Left message for [name], [facility] to leave a message with her PT recommendations and I would send to Dr. Amalia Hailey.

## 2020-03-16 ENCOUNTER — Telehealth: Payer: Self-pay | Admitting: *Deleted

## 2020-03-16 NOTE — Telephone Encounter (Signed)
"  We need orders and an abbreviated H&P for [name]  He's coming in tomorrow for his pre-op visit."  I'll let Dr. Amalia Hailey know.

## 2020-03-16 NOTE — Patient Instructions (Addendum)
DUE TO COVID-19 ONLY ONE VISITOR IS ALLOWED TO COME WITH YOU AND STAY IN THE WAITING ROOM ONLY DURING PRE OP AND PROCEDURE DAY OF SURGERY. THE 1 VISITOR  MAY VISIT WITH YOU AFTER SURGERY IN YOUR PRIVATE ROOM DURING VISITING HOURS ONLY!  YOU NEED TO HAVE A COVID 19 TEST ON_8/17______ @_11 :30______, THIS TEST MUST BE DONE BEFORE SURGERY,  COVID TESTING SITE [place] [address], IT IS ON THE RIGHT GOING OUT [address] APPROXIMATELY  2 MINUTES PAST [employer] ON THE RIGHT. ONCE YOUR COVID TEST IS COMPLETED,  PLEASE BEGIN THE QUARANTINE INSTRUCTIONS AS OUTLINED IN YOUR HANDOUT.                [name]   Your procedure is scheduled on: 03/27/20   Report to [facility] Main  Entrance   Report to admitting at  3:15 AM     Call this number if you have problems the morning of surgery [phone]    Remember: Do not eat food after Midnight.  You may have clear liquids until 9:00 AM    CLEAR LIQUID DIET   Foods Allowed                                                                     Foods Excluded  Coffee and tea, regular and decaf                             liquids that you cannot  Plain Jell-O any favor except red or purple                                           see through such as: Fruit ices (not with fruit pulp)                                     milk, soups, orange juice  Iced Popsicles                                    All solid food Carbonated beverages, regular and diet                                    Cranberry, grape and apple juices Sports drinks like Gatorade Lightly seasoned clear broth or consume(fat free) Sugar, honey syrup   Nothing more by mouth after 9:00 AM   BRUSH YOUR TEETH MORNING OF SURGERY AND RINSE YOUR MOUTH OUT, NO CHEWING GUM CANDY OR MINTS.     Take these medicines the morning of surgery with A SIP OF WATER: Cymbalta, Protonix, Zyrtec, Coreg, Amlodipine, Tamsulosin                                  You may not have any metal  on your body including              piercings  Do not wear jewelry, lotions, powders or deodorant                      Men may shave face and neck.   Do not bring valuables to the hospital. [place].  Contacts, dentures or bridgework may not be worn into surgery.       Patients discharged the day of surgery will not be allowed to drive home.   IF YOU ARE HAVING SURGERY AND GOING HOME THE SAME DAY, YOU MUST HAVE AN ADULT TO DRIVE YOU HOME AND BE WITH YOU FOR 24 HOURS.  YOU MAY GO HOME BY TAXI OR UBER OR ORTHERWISE, BUT AN ADULT MUST ACCOMPANY YOU HOME AND STAY WITH YOU FOR 24 HOURS.  Name and phone number of your driver:  Special Instructions: N/A              Please read over the following fact sheets you were given: _____________________________________________________________________  [facility] - Preparing for Surgery  Before surgery, you can play an important role .  Because skin is not sterile, your skin needs to be as free of germs as possibl e.  You can reduce the number of germs on your skin by washing with CHG (chlorahexidine gluconate) soap before surgery.   CHG is an antiseptic cleaner which kills germs and bonds with the skin to continue killing germs even after washing. Please DO NOT use if you have an allergy to CHG or antibacterial soaps.   If your skin becomes reddened/irritated stop using the CHG and inform your nurse when you arrive at Short Stay.  You may shave your face/neck.  Please follow these instructions carefully:  1.  Shower with CHG Soap the night before surgery and the  morning of Surgery.  2.  If you choose to wash your hair, wash your hair first as usual with your  normal  shampoo.  3.  After you shampoo, rinse your hair and body thoroughly to remove the  shampoo.                                        4.  Use CHG as you would any other liquid soap.  You can apply  chg directly  to the skin and wash                       Gently with a scrungie or clean washcloth.  5.  Apply the CHG Soap to your body ONLY FROM THE NECK DOWN.   Do not use on face/ open                           Wound or open sores. Avoid contact with eyes, ears mouth and genitals (private parts).                       Wash face,  Genitals (private parts) with your normal soap.             6.  Wash thoroughly, paying special attention to the area where your surgery  will be performed.  7.  Thoroughly rinse your body with warm water from the neck down.  8.  DO NOT shower/wash with your normal soap after using and rinsing off  the CHG Soap.             9.  Pat yourself dry with a clean towel.            10.  Wear clean pajamas.            11.  Place clean sheets on your bed the night of your first shower and do not  sleep with pets. Day of Surgery : Do not apply any lotions/deodorants the morning of surgery.  Please wear clean clothes to the hospital/surgery center.  FAILURE TO FOLLOW THESE INSTRUCTIONS MAY RESULT IN THE CANCELLATION OF YOUR SURGERY PATIENT SIGNATURE_________________________________  NURSE SIGNATURE__________________________________  ________________________________________________________________________

## 2020-03-17 ENCOUNTER — Encounter (HOSPITAL_COMMUNITY)
Admission: RE | Admit: 2020-03-17 | Discharge: 2020-03-17 | Disposition: A | Discharge status: Home / Self Care | Payer: Managed Care, Other (non HMO) | Source: Ambulatory Visit | Attending: Podiatry | Admitting: Podiatry

## 2020-03-17 ENCOUNTER — Telehealth: Payer: Self-pay | Admitting: Podiatry

## 2020-03-17 ENCOUNTER — Ambulatory Visit: Payer: Managed Care, Other (non HMO) | Admitting: Podiatry

## 2020-03-17 ENCOUNTER — Other Ambulatory Visit: Payer: Self-pay

## 2020-03-17 ENCOUNTER — Encounter (HOSPITAL_COMMUNITY): Payer: Self-pay

## 2020-03-17 DIAGNOSIS — L97513 Non-pressure chronic ulcer of other part of right foot with necrosis of muscle: Secondary | ICD-10-CM

## 2020-03-17 DIAGNOSIS — Z01812 Encounter for preprocedural laboratory examination: Secondary | ICD-10-CM | POA: Insufficient documentation

## 2020-03-17 DIAGNOSIS — L97523 Non-pressure chronic ulcer of other part of left foot with necrosis of muscle: Secondary | ICD-10-CM

## 2020-03-17 HISTORY — DX: Angina pectoris, unspecified: I20.9

## 2020-03-17 HISTORY — DX: Dyspnea, unspecified: R06.00

## 2020-03-17 HISTORY — DX: Anxiety disorder, unspecified: F41.9

## 2020-03-17 LAB — BASIC METABOLIC PANEL
Anion gap: 13 (ref 5–15)
BUN: 19 mg/dL (ref 6–20)
CO2: 24 mmol/L (ref 22–32)
Calcium: 9.5 mg/dL (ref 8.9–10.3)
Chloride: 103 mmol/L (ref 98–111)
Creatinine, Ser: 1.21 mg/dL (ref 0.61–1.24)
GFR calc Af Amer: >60 mL/min (ref >60)
GFR calc non Af Amer: >60 mL/min (ref >60)
Glucose, Bld: 124 mg/dL — ABNORMAL HIGH (ref 70–99)
Potassium: 4.3 mmol/L (ref 3.5–5.1)
Sodium: 140 mmol/L (ref 135–145)

## 2020-03-17 LAB — CBC
HCT: 51.2 % (ref 39.0–52.0)
Hemoglobin: 15.8 g/dL (ref 13.0–17.0)
MCH: 26.7 pg (ref 26.0–34.0)
MCHC: 30.9 g/dL (ref 30.0–36.0)
MCV: 86.6 fL (ref 80.0–100.0)
Platelets: 170 10*3/uL (ref 150–400)
RBC: 5.91 MIL/uL — ABNORMAL HIGH (ref 4.22–5.81)
RDW: 14.2 % (ref 11.5–15.5)
WBC: 7.3 10*3/uL (ref 4.0–10.5)
nRBC: 0 % (ref 0.0–0.2)

## 2020-03-17 NOTE — Telephone Encounter (Signed)
Pt states the black scarring is loose and hanging on to good skin. I offered pt an appt today at 4:00pm.

## 2020-03-17 NOTE — Progress Notes (Signed)
  Subjective:  Patient ID: [name], male    DOB: [birth year],  MRN: [id number]  Chief Complaint  Patient presents with  . Wound Check    Pt states large chunks of skin are coming off his wounds, wants to make sure everything is healthy and healing normally.   44 y.o. male presents for wound care. Hx confirmed with patient.  Does report pain at the wound left greater than right.  Is concerned about weightbearing on the wounds due to pain but is currently nonweightbearing. Objective:  Physical Exam: Bilateral loosening and hanging foot eschar without warmth erythema signs of acute infection but with serosanguineous drainage and with healing fibrogranular wound base. Assessment:   1. Ulcerated, foot, left, with necrosis of muscle ([place])   2. Ulcerated, foot, right, with necrosis of muscle ([place])     Plan:  Patient was evaluated and treated and all questions answered.  Bilateral foot healing wounds with gangrenous areas status post decubitus ulceration -Discussed with patient that the eschars do appear to be partially loosening off of the wound today.  We discussed this is a normal sign of healing and a sign of progress rather than a sign of infection.  We proceeded with removing parts of the eschar that were nonviable and were no longer fully attached to the underlying skin. This was removed with scissor and forecep.  Any area of full attachment was left intact.  This remained at the heels bilaterally.  Patient tolerated procedure well -The wounds were dressed with Maxorb AG, 4 x 4's, ABD pad, Ace bandage. -He is scheduled for debridement with Dr. Amalia Hailey next week No follow-ups on file.

## 2020-03-17 NOTE — Progress Notes (Signed)
COVID Vaccine Completed:Yes Date COVID Vaccine completed:03/10/20 COVID vaccine manufacturer: [employer]     PCP - Dr/. Rolm Gala Cardiologist - no   Chest x-ray - 10/27/19 EKG - 11/05/19 Stress Test -  ECHO - 10/07/19 Cardiac Cath - 08/03/19  Sleep Study - no CPAP -   Fasting Blood Sugar - NA Checks Blood Sugar _____ times a day  Blood Thinner Instructions:NA Aspirin Instructions: Last Dose:  Anesthesia review:   Patient denies shortness of breath, fever, cough and chest pain at PAT appointment yes   Patient verbalized understanding of instructions that were given to them at the PAT appointment. Patient was also instructed that they will need to review over the PAT instructions again at home before surgery. Yes Pt is still debilitated from having Covid 12 /20. He does get SOB sometime with ADLs. He uses his inhalers everyday however , he feels that he is slowly improving.

## 2020-03-17 NOTE — Telephone Encounter (Signed)
Pt called wanting advice on what to do about foot it wanted a phone call

## 2020-03-19 ENCOUNTER — Telehealth: Payer: Self-pay | Admitting: *Deleted

## 2020-03-19 NOTE — Telephone Encounter (Signed)
"  Calling to see if you have this form, to do his surgery, to be provided by his medical doctor.  I can't remember if they were going to send it to you or if we have already given it to you.  Please give me a call back."

## 2020-03-20 ENCOUNTER — Telehealth: Payer: Self-pay | Admitting: *Deleted

## 2020-03-20 NOTE — Telephone Encounter (Signed)
[facility] - Yvone Neu states called last week for weight bearing status for pt, due to escar, and pressure wound, surgery 03/27/2020.

## 2020-03-23 ENCOUNTER — Telehealth: Payer: Self-pay | Admitting: Podiatry

## 2020-03-23 NOTE — Telephone Encounter (Signed)
[name] from [facility] called and wanted to know if pt can weight bearing or not since pt has surgrey scheduled please assist

## 2020-03-23 NOTE — Telephone Encounter (Signed)
alled spoke to karen at [facility] she wanted to see what Dr.evans wants for this ot after surgry

## 2020-03-24 ENCOUNTER — Other Ambulatory Visit (HOSPITAL_COMMUNITY)
Admission: RE | Admit: 2020-03-24 | Discharge: 2020-03-24 | Disposition: A | Discharge status: Home / Self Care | Payer: Managed Care, Other (non HMO) | Source: Ambulatory Visit | Attending: Podiatry | Admitting: Podiatry

## 2020-03-24 DIAGNOSIS — Z20822 Contact with and (suspected) exposure to covid-19: Secondary | ICD-10-CM | POA: Insufficient documentation

## 2020-03-24 DIAGNOSIS — Z01812 Encounter for preprocedural laboratory examination: Secondary | ICD-10-CM | POA: Insufficient documentation

## 2020-03-24 LAB — SARS CORONAVIRUS 2 (TAT 6-24 HRS): SARS Coronavirus 2: NEGATIVE

## 2020-03-26 ENCOUNTER — Telehealth: Payer: Self-pay | Admitting: Podiatry

## 2020-03-26 MED ORDER — DEXTROSE 5 % IV SOLN
3.0000 g | INTRAVENOUS | Status: AC
  Administered 2020-03-27: 3 g via INTRAVENOUS
  Filled 2020-03-26: qty 3

## 2020-03-26 NOTE — Telephone Encounter (Addendum)
I am returning your call.  How can I help you?  "I'm scheduled for surgery on tomorrow.  Is my surgery at 3 pm?"  Yes it starts at 3 pm.  However, you're probably have to arrive there at least two hours prior to your appointment.  "How long will my surgery take?"  Your surgery will be about a hour.  It will probably take you about a hour to wake up from the anesthesia.  "Okay, great, thanks for calling me back."  Did you see your primary care physician and give them the history and physical form?  "Yes, I took it to them and they said they were going to fax it to [facility].  Did they not send it?"  No, they were inquiring about it and we haven't received it.  Can you call them in the morning and ask them to fax it to me and I'll make sure the surgical center gets it?  "I'll call them first thing in the morning."

## 2020-03-26 NOTE — Anesthesia Preprocedure Evaluation (Addendum)
Anesthesia Evaluation  Patient identified by MRN, date of birth, ID band Patient awake    Reviewed: Allergy & Precautions, NPO status , Patient's Chart, lab work & pertinent test results  Airway Mallampati: III  TM Distance: >3 FB     Dental  (+) Teeth Intact   Pulmonary shortness of breath and with exertion, asthma , pneumonia,  Extensive hospitalization for COVID including 2 months on ECMO.  Admitted 07/22/2019 due to COVID PNA, discharged 12/04/2019.  Has continued shortness of breath.  Pt reports that he feels he is slowly improving.   breath sounds clear to auscultation       Cardiovascular hypertension,  Rhythm:Regular Rate:Normal     Neuro/Psych  Headaches, Anxiety  Neuromuscular disease (critical illness myopathy)    GI/Hepatic Neg liver ROS, GERD  ,  Endo/Other  Morbid obesity  Renal/GU   negative genitourinary   Musculoskeletal  (+) Arthritis ,   Abdominal (+) + obese,   Peds  Hematology   Anesthesia Other Findings   Reproductive/Obstetrics negative OB ROS                           Anesthesia Physical Anesthesia Plan  ASA: III  Anesthesia Plan: MAC   Post-op Pain Management:    Induction:   PONV Risk Score and Plan: 1 and Ondansetron, Midazolam, Propofol infusion, TIVA and Treatment may vary due to age or medical condition  Airway Management Planned: Natural Airway and Simple Face Mask  Additional Equipment:   Intra-op Plan:   Post-operative Plan:   Informed Consent: I have reviewed the patients History and Physical, chart, labs and discussed the procedure including the risks, benefits and alternatives for the proposed anesthesia with the patient or authorized representative who has indicated his/her understanding and acceptance.       Plan Discussed with: CRNA and Anesthesiologist  Anesthesia Plan Comments: (See PAT note 03/17/2020, Konrad Felix, PA-C)       Anesthesia Quick Evaluation

## 2020-03-26 NOTE — Progress Notes (Signed)
Anesthesia Chart Review   Case: [id number] Date/Time: 03/27/20 1500   Procedures:      EXCISIONAL DEBRIDEMENT OF ULCERS BILATERAL FEET (Bilateral )     APPLICATION OF SKIN GRAFT BILATERAL FEET (Bilateral )   Anesthesia type: Monitor Anesthesia Care   Pre-op diagnosis: BILATERAL ULCERS OF THE FEET   Location[facility] ROOM 05 / [facility]   Surgeons: Edrick Kins, DPM      DISCUSSION:44 y.o. never smoker with h/o asthma, HTN, GERD, bilateral ulcers of the feet scheduled for above procedure 03/27/2020 with Daylene Katayama, DPM.    Extensive hospitalization for COVID including 2 months on ECMO.  Admitted 07/22/2019 due to COVID PNA, discharged 12/04/2019.  Has continued shortness of breath.  Pt reports that he feels he is slowly improving.  Taking Symbicort BID daily and albuterol PRN.  Last seen by pulmonology 03/09/2020.  Per OV note pt is working in the yard and around the house as well as going for walks at a slow pace.  Per Dr. 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 Likely component of NM weakness and deconditioning given prolonged hospital stay and known critical illness myopathy. Possible his asthma is not as well controlled as once thought. Oxygen borderline today."   VS: BP 133/67   Pulse 75   Temp 36.8 C (Oral)   Resp 18   Ht 5' 8"  (1.727 m)   Wt 120.3 kg   SpO2 95%   BMI 40.31 kg/m   PROVIDERS: Michael Boston, MD is PCP   Larey Days, MD is Pulmonologist   Margette Fast, MD is Neurologist last seen 01/31/2020 LABS: Labs reviewed: Acceptable for surgery. (all labs ordered are listed, but only abnormal results are displayed)  Labs Reviewed  BASIC METABOLIC PANEL - Abnormal; Notable for the following components:      Result Value   Glucose, Bld 124 (*)    All other components within normal limits  CBC - Abnormal; Notable for the following components:   RBC 5.91 (*)    All other  components within normal limits     IMAGES:   EKG: 11/05/2019 Rate 103 bpm  Sinus tachycardia Possible Inferior infarct , age undetermined Anterior infarct , age undetermined Abnormal ECG No significant change since last tracing  CV: Echo 10/07/2019 IMPRESSIONS    1. Left ventricular ejection fraction, by estimation, is 70 to 75%. The  left ventricle has hyperdynamic function. The left ventricle has no  regional wall motion abnormalities. Left ventricular diastolic parameters  were normal.  2. Right ventricular systolic function is normal. The right ventricular  size is normal. There is moderately elevated pulmonary artery systolic  pressure.  3. The mitral valve is normal in structure and function. Mild mitral  valve regurgitation. No evidence of mitral stenosis.  4. The aortic valve is grossly normal. Aortic valve regurgitation is not  visualized. No aortic stenosis is present.  Past Medical History:  Diagnosis Date  . Anginal pain ([place])    with covid  . Anxiety   . Asthma   . Dyspnea   . GERD (gastroesophageal reflux disease)   . Headache   . History of kidney stones    LEFT URETERAL STONE  . HTN (hypertension)   . Pancreatitis 2018   GALLBALDDER SLUDGE CAUSED ISSUED RESOLVED  . Pneumonia 07/2019   covid    Past Surgical History:  Procedure Laterality Date  . CANNULATION  FOR ECMO (EXTRACORPOREAL MEMBRANE OXYGENATION) N/A 08/28/2019   Procedure: CANNULATION FOR VV ECMO (EXTRACORPOREAL MEMBRANE OXYGENATION);  Surgeon: Prescott Gum, Collier Salina, MD;  Location: [place];  Service: Open Heart Surgery;  Laterality: N/A;  CRESCENT CANNULA  . CANNULATION FOR ECMO (EXTRACORPOREAL MEMBRANE OXYGENATION) N/A 09/10/2019   Procedure: CANNULATION FOR ECMO (EXTRACORPOREAL MEMBRANE OXYGENATION) PUTTING IN CRESCENT 32FR CANNULA  AND REMOVING GROING CANNULATION;  Surgeon: Wonda Olds, MD;  Location: [place];  Service: Open Heart Surgery;  Laterality: N/A;  PUTTING IN CRESCENT/REMOVING  GROIN CANNULATION  . CYSTOSCOPY/URETEROSCOPY/HOLMIUM LASER/STENT PLACEMENT Left 04/18/2019   Procedure: LEFT URETEROSCOPY/HOLMIUM LASER/STENT PLACEMENT;  Surgeon: Ardis Hughs, MD;  Location[facility];  Service: Urology;  Laterality: Left;  . CYSTOSCOPY/URETEROSCOPY/HOLMIUM LASER/STENT PLACEMENT Left 05/02/2019   Procedure: CYSTOSCOPY/URETEROSCOPY/HOLMIUM LASER/STENT EXCHANGE;  Surgeon: Ardis Hughs, MD;  [facility];  Service: Urology;  Laterality: Left;  . ECMO CANNULATION N/A 08/03/2019   Procedure: ECMO CANNULATION;  Surgeon: Wonda Olds, MD;  Location: [place] [facility];  Service: Cardiovascular;  Laterality: N/A;  . ESOPHAGOGASTRODUODENOSCOPY N/A 09/11/2019   Procedure: ESOPHAGOGASTRODUODENOSCOPY (EGD);  Surgeon: Wonda Olds, MD;  Location: [place];  Service: Thoracic;  Laterality: N/A;  . IR [place] Vivianne Master  10/14/2019  . IRRIGATION AND DEBRIDEMENT SHOULDER Right 09/29/2017   Procedure: IRRIGATION AND DEBRIDEMENT SHOULDER;  Surgeon: Leandrew Koyanagi, MD;  Location: [place];  Service: Orthopedics;  Laterality: Right;  . [place] SURGERY  2002  . NASAL ENDOSCOPY WITH EPISTAXIS CONTROL N/A 08/31/2019   Procedure: NASAL ENDOSCOPY WITH EPISTAXIS CONTROL WITH CAUTERIZATION;  Surgeon: Melida Quitter, MD;  Location: [place];  Service: ENT;  Laterality: N/A;  . PORTACATH PLACEMENT N/A 09/11/2019   Procedure: PEG TUBE INSERTION - BEDSIDE;  Surgeon: Wonda Olds, MD;  Location: [place];  Service: Thoracic;  Laterality: N/A;  . TEE WITHOUT CARDIOVERSION N/A 08/28/2019   Procedure: TRANSESOPHAGEAL ECHOCARDIOGRAM (TEE);  Surgeon: Prescott Gum, Collier Salina, MD;  Location: [place];  Service: Open Heart Surgery;  Laterality: N/A;  . TEE WITHOUT CARDIOVERSION N/A 09/10/2019   Procedure: TRANSESOPHAGEAL ECHOCARDIOGRAM (TEE);  Surgeon: Wonda Olds, MD;  Location: [place];  Service: Open Heart Surgery;  Laterality: N/A;    MEDICATIONS: . acetaminophen  (TYLENOL) 325 MG tablet  . albuterol (VENTOLIN HFA) 108 (90 Base) MCG/ACT inhaler  . amLODipine (NORVASC) 2.5 MG tablet  . carvedilol (COREG) 25 MG tablet  . cetirizine (ZYRTEC) 10 MG tablet  . clonazePAM (KLONOPIN) 1 MG tablet  . DULoxetine (CYMBALTA) 60 MG capsule  . escitalopram (LEXAPRO) 20 MG tablet  . fluticasone (FLONASE) 50 MCG/ACT nasal spray  . GUAIFENESIN ER PO  . HYDROcodone-acetaminophen (NORCO/VICODIN) 5-325 MG tablet  . Melatonin 10 MG TABS  . metoprolol tartrate (LOPRESSOR) 25 MG tablet  . montelukast (SINGULAIR) 10 MG tablet  . pantoprazole (PROTONIX) 40 MG tablet  . spironolactone (ALDACTONE) 25 MG tablet  . SYMBICORT 160-4.5 MCG/ACT inhaler  . tamsulosin (FLOMAX) 0.4 MG CAPS capsule  . traZODone (DESYREL) 100 MG tablet   No current facility-administered medications for this encounter.   Derrill Memo ON 03/27/2020] ceFAZolin (ANCEF) 3 g in dextrose 5 % 50 mL IVPB    Konrad Felix, PA-C [facility] [phone]

## 2020-03-26 NOTE — Telephone Encounter (Signed)
I have a sx tomorrow at the hospital. I have a sheet from when I went to [place] to get my blood work done with some really early times on it. It says to report to admitting at 3:15 am, which I'm not sure if that's right or not. I'm calling to find out when the actual sx is. Could you please give me a call back. Thank you.

## 2020-03-27 ENCOUNTER — Ambulatory Visit (HOSPITAL_COMMUNITY): Payer: Managed Care, Other (non HMO) | Admitting: Certified Registered Nurse Anesthetist

## 2020-03-27 ENCOUNTER — Ambulatory Visit (HOSPITAL_COMMUNITY)
Admission: RE | Admit: 2020-03-27 | Discharge: 2020-03-27 | Disposition: A | Discharge status: Home / Self Care | Payer: Managed Care, Other (non HMO) | Source: Other Acute Inpatient Hospital | Attending: Podiatry | Admitting: Podiatry

## 2020-03-27 ENCOUNTER — Ambulatory Visit (HOSPITAL_COMMUNITY): Payer: Managed Care, Other (non HMO) | Admitting: Physician Assistant

## 2020-03-27 ENCOUNTER — Encounter (HOSPITAL_COMMUNITY): Payer: Self-pay | Admitting: Podiatry

## 2020-03-27 ENCOUNTER — Other Ambulatory Visit: Payer: Self-pay

## 2020-03-27 ENCOUNTER — Encounter (HOSPITAL_COMMUNITY)
Admission: RE | Disposition: A | Discharge status: Home / Self Care | Payer: Self-pay | Source: Other Acute Inpatient Hospital | Attending: Podiatry

## 2020-03-27 DIAGNOSIS — K219 Gastro-esophageal reflux disease without esophagitis: Secondary | ICD-10-CM | POA: Diagnosis not present

## 2020-03-27 DIAGNOSIS — Z6841 Body Mass Index (BMI) 40.0 and over, adult: Secondary | ICD-10-CM | POA: Insufficient documentation

## 2020-03-27 DIAGNOSIS — L97523 Non-pressure chronic ulcer of other part of left foot with necrosis of muscle: Secondary | ICD-10-CM | POA: Diagnosis not present

## 2020-03-27 DIAGNOSIS — J45909 Unspecified asthma, uncomplicated: Secondary | ICD-10-CM | POA: Diagnosis not present

## 2020-03-27 DIAGNOSIS — I10 Essential (primary) hypertension: Secondary | ICD-10-CM | POA: Diagnosis not present

## 2020-03-27 DIAGNOSIS — G7281 Critical illness myopathy: Secondary | ICD-10-CM | POA: Insufficient documentation

## 2020-03-27 DIAGNOSIS — Z8616 Personal history of COVID-19: Secondary | ICD-10-CM | POA: Insufficient documentation

## 2020-03-27 DIAGNOSIS — L89890 Pressure ulcer of other site, unstageable: Secondary | ICD-10-CM | POA: Diagnosis not present

## 2020-03-27 DIAGNOSIS — L89894 Pressure ulcer of other site, stage 4: Secondary | ICD-10-CM | POA: Diagnosis not present

## 2020-03-27 HISTORY — PX: WOUND DEBRIDEMENT: SHX247

## 2020-03-27 HISTORY — PX: GRAFT APPLICATION: SHX6696

## 2020-03-27 SURGERY — DEBRIDEMENT, WOUND
Anesthesia: Monitor Anesthesia Care | Laterality: Bilateral

## 2020-03-27 MED ORDER — ACETAMINOPHEN 10 MG/ML IV SOLN: 1000.0000 mg | Freq: Once | INTRAVENOUS | Status: AC

## 2020-03-27 MED ORDER — FENTANYL CITRATE (PF) 100 MCG/2ML IJ SOLN
INTRAMUSCULAR | Status: DC | PRN
  Administered 2020-03-27 (×3): 25 ug via INTRAVENOUS
  Administered 2020-03-27: 50 ug via INTRAVENOUS
  Administered 2020-03-27 (×3): 25 ug via INTRAVENOUS

## 2020-03-27 MED ORDER — ALBUTEROL SULFATE (2.5 MG/3ML) 0.083% IN NEBU
INHALATION_SOLUTION | RESPIRATORY_TRACT | Status: AC
  Administered 2020-03-27: 2.5 mg via RESPIRATORY_TRACT
  Filled 2020-03-27: qty 3

## 2020-03-27 MED ORDER — PROPOFOL 10 MG/ML IV BOLUS
INTRAVENOUS | Status: DC | PRN
  Administered 2020-03-27: 180 mg via INTRAVENOUS

## 2020-03-27 MED ORDER — FENTANYL CITRATE (PF) 100 MCG/2ML IJ SOLN
25.0000 ug | INTRAMUSCULAR | Status: DC | PRN
  Administered 2020-03-27: 50 ug via INTRAVENOUS

## 2020-03-27 MED ORDER — OXYCODONE HCL 5 MG PO TABS: 5.0000 mg | ORAL_TABLET | Freq: Once | ORAL | Status: AC | PRN

## 2020-03-27 MED ORDER — VANCOMYCIN HCL 1000 MG IV SOLR
INTRAVENOUS | Status: AC
  Filled 2020-03-27: qty 1000

## 2020-03-27 MED ORDER — HYDROMORPHONE HCL 1 MG/ML IJ SOLN: 0.2500 mg | INTRAMUSCULAR | Status: DC | PRN

## 2020-03-27 MED ORDER — ALBUTEROL SULFATE (2.5 MG/3ML) 0.083% IN NEBU: 2.5000 mg | INHALATION_SOLUTION | Freq: Once | RESPIRATORY_TRACT | Status: AC

## 2020-03-27 MED ORDER — ALBUTEROL SULFATE HFA 108 (90 BASE) MCG/ACT IN AERS
INHALATION_SPRAY | RESPIRATORY_TRACT | Status: AC
  Filled 2020-03-27: qty 6.7

## 2020-03-27 MED ORDER — FENTANYL CITRATE (PF) 250 MCG/5ML IJ SOLN
INTRAMUSCULAR | Status: AC
  Filled 2020-03-27: qty 5

## 2020-03-27 MED ORDER — PROMETHAZINE HCL 25 MG/ML IJ SOLN: 6.2500 mg | INTRAMUSCULAR | Status: DC | PRN

## 2020-03-27 MED ORDER — LIDOCAINE HCL 1 % IJ SOLN
INTRAMUSCULAR | Status: DC | PRN
  Administered 2020-03-27: 20 mL

## 2020-03-27 MED ORDER — OXYCODONE HCL 5 MG/5ML PO SOLN: 5.0000 mg | Freq: Once | ORAL | Status: AC | PRN

## 2020-03-27 MED ORDER — ONDANSETRON HCL 4 MG/2ML IJ SOLN: 4.0000 mg | Freq: Once | INTRAMUSCULAR | Status: DC | PRN

## 2020-03-27 MED ORDER — MIDAZOLAM HCL 5 MG/5ML IJ SOLN
INTRAMUSCULAR | Status: DC | PRN
  Administered 2020-03-27 (×2): 1 mg via INTRAVENOUS

## 2020-03-27 MED ORDER — LIDOCAINE HCL (PF) 1 % IJ SOLN
INTRAMUSCULAR | Status: AC
  Filled 2020-03-27: qty 30

## 2020-03-27 MED ORDER — CHLORHEXIDINE GLUCONATE 0.12 % MT SOLN
15.0000 mL | Freq: Once | OROMUCOSAL | Status: AC
  Administered 2020-03-27: 15 mL via OROMUCOSAL

## 2020-03-27 MED ORDER — PROPOFOL 500 MG/50ML IV EMUL
INTRAVENOUS | Status: DC | PRN
  Administered 2020-03-27: 25 ug/kg/min via INTRAVENOUS

## 2020-03-27 MED ORDER — FENTANYL CITRATE (PF) 100 MCG/2ML IJ SOLN
INTRAMUSCULAR | Status: AC
  Administered 2020-03-27: 50 ug via INTRAVENOUS
  Filled 2020-03-27: qty 2

## 2020-03-27 MED ORDER — OXYCODONE HCL 5 MG PO TABS
ORAL_TABLET | ORAL | Status: AC
Start: 2020-03-27 — End: 2020-03-27
  Administered 2020-03-27: 5 mg via ORAL
  Filled 2020-03-27: qty 1

## 2020-03-27 MED ORDER — MIDAZOLAM HCL 2 MG/2ML IJ SOLN
INTRAMUSCULAR | Status: AC
  Filled 2020-03-27: qty 2

## 2020-03-27 MED ORDER — BUPIVACAINE HCL (PF) 0.5 % IJ SOLN
INTRAMUSCULAR | Status: AC
  Filled 2020-03-27: qty 30

## 2020-03-27 MED ORDER — ACETAMINOPHEN 10 MG/ML IV SOLN
INTRAVENOUS | Status: AC
  Administered 2020-03-27: 1000 mg via INTRAVENOUS
  Filled 2020-03-27: qty 100

## 2020-03-27 MED ORDER — LACTATED RINGERS IV SOLN: INTRAVENOUS | Status: DC

## 2020-03-27 MED ORDER — OXYCODONE-ACETAMINOPHEN 5-325 MG PO TABS: 1.0000 | ORAL_TABLET | ORAL | 0 refills | Status: DC | PRN

## 2020-03-27 MED ORDER — PHENYLEPHRINE HCL-NACL 10-0.9 MG/250ML-% IV SOLN
INTRAVENOUS | Status: DC | PRN
  Administered 2020-03-27: 25 ug/min via INTRAVENOUS

## 2020-03-27 MED ORDER — FENTANYL CITRATE (PF) 100 MCG/2ML IJ SOLN
INTRAMUSCULAR | Status: AC
  Filled 2020-03-27: qty 2

## 2020-03-27 MED ORDER — ONDANSETRON HCL 4 MG/2ML IJ SOLN
INTRAMUSCULAR | Status: DC | PRN
  Administered 2020-03-27: 4 mg via INTRAVENOUS

## 2020-03-27 MED ORDER — ORAL CARE MOUTH RINSE: 15.0000 mL | Freq: Once | OROMUCOSAL | Status: AC

## 2020-03-27 MED ORDER — OXYCODONE-ACETAMINOPHEN 5-325 MG PO TABS
1.0000 | ORAL_TABLET | ORAL | 0 refills | Status: DC | PRN
Start: 2020-03-27 — End: 2020-04-01

## 2020-03-27 MED ORDER — BUPIVACAINE HCL (PF) 0.5 % IJ SOLN
INTRAMUSCULAR | Status: DC | PRN
  Administered 2020-03-27: 35 mL

## 2020-03-27 MED ORDER — LIDOCAINE HCL (CARDIAC) PF 50 MG/5ML IV SOSY
PREFILLED_SYRINGE | INTRAVENOUS | Status: DC | PRN
  Administered 2020-03-27: 50 mg via INTRAVENOUS

## 2020-03-27 MED ORDER — SODIUM CHLORIDE 0.9 % IR SOLN
Status: DC | PRN
  Administered 2020-03-27: 3000 mL

## 2020-03-27 MED ORDER — PROPOFOL 10 MG/ML IV BOLUS
INTRAVENOUS | Status: AC
  Filled 2020-03-27: qty 20

## 2020-03-27 MED ORDER — DOXYCYCLINE HYCLATE 100 MG PO CAPS: 100.0000 mg | ORAL_CAPSULE | Freq: Two times a day (BID) | ORAL | 0 refills | Status: DC

## 2020-03-27 SURGICAL SUPPLY — 61 items
APL PRP STRL LF DISP 70% ISPRP (MISCELLANEOUS) ×2
BLADE SURG 15 STRL LF DISP TIS (BLADE) ×1 IMPLANT
BLADE SURG 15 STRL SS (BLADE) ×2
BNDG CMPR 9X4 STRL LF SNTH (GAUZE/BANDAGES/DRESSINGS) ×1
BNDG ELASTIC 3X5.8 VLCR STR LF (GAUZE/BANDAGES/DRESSINGS) ×3 IMPLANT
BNDG ELASTIC 4X5.8 VLCR STR LF (GAUZE/BANDAGES/DRESSINGS) ×2 IMPLANT
BNDG ESMARK 4X9 LF (GAUZE/BANDAGES/DRESSINGS) ×1 IMPLANT
BNDG GAUZE ELAST 4 BULKY (GAUZE/BANDAGES/DRESSINGS) ×3 IMPLANT
CHLORAPREP W/TINT 26 (MISCELLANEOUS) ×2 IMPLANT
CNTNR URN SCR LID CUP LEK RST (MISCELLANEOUS) IMPLANT
CONT SPEC 4OZ STRL OR WHT (MISCELLANEOUS) ×2
COVER BACK TABLE 60X90IN (DRAPES) ×2 IMPLANT
COVER WAND RF STERILE (DRAPES) ×1 IMPLANT
CUFF TOURN SGL QUICK 18X4 (TOURNIQUET CUFF) ×2 IMPLANT
CUFF TOURN SGL QUICK 24 (TOURNIQUET CUFF)
CUFF TRNQT CYL 24X4X16.5-23 (TOURNIQUET CUFF) IMPLANT
DRAPE 3/4 80X56 (DRAPES) ×2 IMPLANT
DRAPE EXTREMITY T 121X128X90 (DISPOSABLE) ×1 IMPLANT
DRAPE SHEET LG 3/4 BI-LAMINATE (DRAPES) ×2 IMPLANT
DRAPE U-SHAPE 47X51 STRL (DRAPES) ×1 IMPLANT
ELECT REM PT RETURN 15FT ADLT (MISCELLANEOUS) ×2 IMPLANT
GAUZE SPONGE 4X4 12PLY STRL (GAUZE/BANDAGES/DRESSINGS) ×3 IMPLANT
GAUZE XEROFORM 1X8 LF (GAUZE/BANDAGES/DRESSINGS) IMPLANT
GLOVE BIO SURGEON STRL SZ7.5 (GLOVE) ×7 IMPLANT
GLOVE BIOGEL PI IND STRL 8 (GLOVE) ×1 IMPLANT
GLOVE BIOGEL PI INDICATOR 8 (GLOVE) ×1
GOWN STRL REUS W/ TWL XL LVL3 (GOWN DISPOSABLE) ×1 IMPLANT
GOWN STRL REUS W/TWL XL LVL3 (GOWN DISPOSABLE) ×4
HANDPIECE INTERPULSE COAX TIP (DISPOSABLE) ×2
KIT BASIN OR (CUSTOM PROCEDURE TRAY) ×2 IMPLANT
MANIFOLD NEPTUNE II (INSTRUMENTS) ×2 IMPLANT
MATRIX WOUND 3-LAYER 10X15 (Tissue) ×2 IMPLANT
MICROMATRIX 1000MG (Tissue) ×4 IMPLANT
NDL HYPO 25X1 1.5 SAFETY (NEEDLE) ×1 IMPLANT
NEEDLE HYPO 25X1 1.5 SAFETY (NEEDLE) ×2 IMPLANT
NS IRRIG 1000ML POUR BTL (IV SOLUTION) ×1 IMPLANT
PADDING CAST ABS 4INX4YD NS (CAST SUPPLIES) ×1
PADDING CAST ABS COTTON 4X4 ST (CAST SUPPLIES) ×1 IMPLANT
PENCIL SMOKE EVAC W/HOLSTER (ELECTROSURGICAL) ×2 IMPLANT
SET HNDPC FAN SPRY TIP SCT (DISPOSABLE) IMPLANT
SET IRRIG Y TYPE TUR BLADDER L (SET/KITS/TRAYS/PACK) IMPLANT
SOLUTION PARTIC MCRMTRX 1000MG (Tissue) IMPLANT
SPONGE LAP 4X18 RFD (DISPOSABLE) ×1 IMPLANT
STAPLER VISISTAT 35W (STAPLE) ×1 IMPLANT
STOCKINETTE 6  STRL (DRAPES) ×2
STOCKINETTE 6 STRL (DRAPES) ×1 IMPLANT
SUCTION FRAZIER HANDLE 10FR (MISCELLANEOUS)
SUCTION TUBE FRAZIER 10FR DISP (MISCELLANEOUS) IMPLANT
SUT ETHILON 3 0 PS 1 (SUTURE) IMPLANT
SUT ETHILON 4 0 PS 2 18 (SUTURE) IMPLANT
SUT MNCRL AB 3-0 PS2 18 (SUTURE) IMPLANT
SUT MNCRL AB 4-0 PS2 18 (SUTURE) IMPLANT
SUT PROLENE 3 0 PS 2 (SUTURE) ×2 IMPLANT
SUT VIC AB 2-0 SH 27 (SUTURE)
SUT VIC AB 2-0 SH 27XBRD (SUTURE) IMPLANT
SYR BULB EAR ULCER 3OZ GRN STR (SYRINGE) ×2 IMPLANT
SYR CONTROL 10ML LL (SYRINGE) ×2 IMPLANT
TRAY PREP A LATEX SAFE STRL (SET/KITS/TRAYS/PACK) ×1 IMPLANT
TUBE IRRIGATION SET MISONIX (TUBING) IMPLANT
UNDERPAD 30X36 HEAVY ABSORB (UNDERPADS AND DIAPERS) ×3 IMPLANT
YANKAUER SUCT BULB TIP NO VENT (SUCTIONS) ×1 IMPLANT

## 2020-03-27 NOTE — Anesthesia Procedure Notes (Signed)
Procedure Name: LMA Insertion Date/Time: 03/27/2020 3:27 PM Performed by: Lissa Morales, CRNA Pre-anesthesia Checklist: Patient identified, Emergency Drugs available, Suction available and Patient being monitored Patient Re-evaluated:Patient Re-evaluated prior to induction Oxygen Delivery Method: Circle system utilized Preoxygenation: Pre-oxygenation with 100% oxygen Induction Type: IV induction LMA: LMA with gastric port inserted LMA Size: 5.0 Tube type: Oral Number of attempts: 1 Airway Equipment and Method: Oral airway Placement Confirmation: positive ETCO2 Tube secured with: Tape Dental Injury: Teeth and Oropharynx as per pre-operative assessment

## 2020-03-27 NOTE — Addendum Note (Signed)
Addended by: Boneta Lucks on: 03/27/2020 07:22 PM   Modules accepted: Orders

## 2020-03-27 NOTE — Transfer of Care (Signed)
Immediate Anesthesia Transfer of Care Note  Patient: [name]  Procedure(s) Performed: EXCISIONAL DEBRIDEMENT OF ULCERS BILATERAL FEET (Bilateral ) APPLICATION OF SKIN GRAFT BILATERAL FEET (Bilateral )  Patient Location: PACU  Anesthesia Type:General  Level of Consciousness: awake, alert , oriented and patient cooperative  Airway & Oxygen Therapy: Patient Spontanous Breathing and Patient connected to face mask oxygen  Post-op Assessment: Report given to RN, Post -op Vital signs reviewed and stable and Patient moving all extremities X 4  Post vital signs: stable  Last Vitals:  Vitals Value Taken Time  BP 159/82 03/27/20 1632  Temp    Pulse 76 03/27/20 1644  Resp 16 03/27/20 1644  SpO2 100 % 03/27/20 1644  Vitals shown include unvalidated device data.  Last Pain:  Vitals:   03/27/20 1333  TempSrc:   PainSc: 4       Patients Stated Pain Goal: 4 (11/88/67 7373)  Complications: No complications documented.

## 2020-03-27 NOTE — Brief Op Note (Signed)
03/27/2020  6:59 PM  PATIENT:  [name]  44 y.o. male  PRE-OPERATIVE DIAGNOSIS:  BILATERAL ULCERS OF THE FEET  POST-OPERATIVE DIAGNOSIS:  bilateral ulcers on feet  PROCEDURE:  Procedure(s): EXCISIONAL DEBRIDEMENT OF ULCERS BILATERAL FEET (Bilateral) APPLICATION OF SKIN GRAFT BILATERAL FEET (Bilateral)  SURGEON:  Surgeon(s) and Role:    Edrick Kins, DPM - Primary  PHYSICIAN ASSISTANT:   ASSISTANTS: none   ANESTHESIA:   general  EBL:  5 mL   BLOOD ADMINISTERED:none  DRAINS: none   LOCAL MEDICATIONS USED:  MARCAINE    and XYLOCAINE   SPECIMEN:  Source of Specimen:  culture right foot  DISPOSITION OF SPECIMEN:  PATHOLOGY  COUNTS:  YES  TOURNIQUET:   Total Tourniquet Time Documented: Calf (N/A) - 32 minutes Calf (N/A) - 28 minutes Total: Calf (N/A) - 60 minutes   DICTATION: .Dragon Dictation  PLAN OF CARE: Discharge to home after PACU  PATIENT DISPOSITION:  PACU - hemodynamically stable.   Delay start of Pharmacological VTE agent (>24hrs) due to surgical blood loss or risk of bleeding: not applicable  Edrick Kins, DPM [facility]  Dr. Edrick Kins, DPM    [address], [place] [address]                Office [phone]  Fax [phone]

## 2020-03-27 NOTE — Op Note (Signed)
OPERATIVE REPORT Patient name: [name] MRN: [id number] DOB: 04/29/1976  DOS:  03/27/2020  Preop Dx: stage IV pressure ulcer b/l feet Postop Dx: same  Procedure:  1. Excisional debridement of ulcers b/l feet 2. Application of wound matrix graft b/l feet  Surgeon: Edrick Kins DPM  Anesthesia: general anesthesia  Hemostasis: Ankle tourniquet inflated to a pressure of 273mHg after esmarch exsanguination   EBL: minimal mL Materials: Acell micromatrix powder 2 grams. Cytal wound matrix 10x15cm qty:2. Injectables: 0.5% Marcaine plain with 1% lidocaine plain mixture  Pathology: Deep wound cultures taken of the right foot and sent to pathology for culture and sensitivity  Condition: The patient tolerated the procedure and anesthesia well. No complications noted or reported   Justification for procedure: The patient is a 44y.o. male who presents today for surgical correction of chronic pressure ulcers that have developed to the bilateral feet. All conservative modalities of been unsuccessful in providing any sort of satisfactory alleviation of symptoms with the patient. The patient was told benefits as well as possible side effects of the surgery. The patient consented for surgical correction. The patient consent form was reviewed. All patient questions were answered. No guarantees were expressed or implied. The patient and the surgeon boson the patient consent form with the witness present and placed in the patient's chart.   Procedure in Detail: The patient was brought to the operating room, placed in the operating table in the supine position at which time an aseptic scrub and drape were performed about the patient's respective lower extremity after anesthesia was induced as described above. Attention was then directed to the surgical area where procedure number one commenced.  Procedure #1: Excisional debridement of ulcers bilateral feet After the feet were prepped  aseptically and tourniquet was inflated attention was directed to the plantar aspect of the bilateral feet where medically necessary excisional debridement was performed using a surgical 10 scalpel.  Excisional debridement of all the necrotic nonviable tissue down to the healthy bleeding viable tissue was performed with post debridement measurements same as pre-.  The wounds measure approximately 16.0 x 4.0 x 1.0 cm (LxWxD).  After appropriate excision of the necrotic debris and also curettage, deep wound cultures were taken of the right foot.  There was some exposed plantar fascia of the right foot that appeared necrotic and nonviable.  This was also excised.  Copious irrigation was then utilized with pulse lavage in preparation for application of graft  Procedure #2: Application of wound matrix graft bilateral feet 2 g of prepared Acell Micromatrix powder was equally divided and applied to the wound base of the bilateral feet.  After application of the ACell Micromatrix, Cytal wound matrix was then applied overlying the ACell after it was prepared appropriately.  The wound matrix was fixated and anchored using wide skin staples around the perimeter of the wound.  Nonadherent Adaptic was also applied overlying the graft followed by hydrating wound gel.  Dry sterile compressive dressings were then applied to the wounds of the patient's bilateral lower extremity. The tourniquet which was used for hemostasis was deflated. All normal neurovascular responses including pink color and warmth returned all the digits of patient's lower extremity.  The patient was then transferred from the operating room to the recovery room having tolerated the procedure and anesthesia well. All vital signs are stable. After a brief stay in the recovery room the patient was discharged with adequate prescriptions for analgesia as well as oral antibiotic for  surgical prophylaxis. Verbal as well as written instructions were provided for  the patient regarding wound care. The patient is to keep the dressings clean dry and intact until they are to follow surgeon Dr. Daylene Katayama in the office upon discharge.   Edrick Kins, DPM [facility]  Dr. Edrick Kins, [address]                                        [place], [place] [address]                Office [phone]  Fax [phone]

## 2020-03-27 NOTE — Telephone Encounter (Signed)
I received the history and physical form and faxed it to the surgical center.

## 2020-03-27 NOTE — Interval H&P Note (Signed)
History and Physical Interval Note:  03/27/2020 2:05 PM  [name]  has presented today for surgery, with the diagnosis of BILATERAL ULCERS OF THE FEET.  The various methods of treatment have been discussed with the patient and family. After consideration of risks, benefits and other options for treatment, the patient has consented to  Procedure(s): EXCISIONAL DEBRIDEMENT OF ULCERS BILATERAL FEET (Bilateral) APPLICATION OF SKIN GRAFT BILATERAL FEET (Bilateral) as a surgical intervention.  The patient's history has been reviewed, patient examined, no change in status, stable for surgery.  I have reviewed the patient's chart and labs.  Questions were answered to the patient's satisfaction.     Edrick Kins

## 2020-03-30 ENCOUNTER — Encounter (HOSPITAL_COMMUNITY): Payer: Self-pay | Admitting: Podiatry

## 2020-03-31 NOTE — Anesthesia Postprocedure Evaluation (Signed)
Anesthesia Post Note  Patient: [name]  Procedure(s) Performed: EXCISIONAL DEBRIDEMENT OF ULCERS BILATERAL FEET (Bilateral ) APPLICATION OF SKIN GRAFT BILATERAL FEET (Bilateral )     Patient location during evaluation: PACU Anesthesia Type: General Level of consciousness: awake and alert, patient cooperative and oriented Pain management: pain level controlled Vital Signs Assessment: post-procedure vital signs reviewed and stable Respiratory status: spontaneous breathing, nonlabored ventilation and respiratory function stable Cardiovascular status: blood pressure returned to baseline and stable Postop Assessment: no apparent nausea or vomiting Anesthetic complications: no   No complications documented.  Last Vitals:  Vitals:   03/27/20 2144 03/27/20 2156  BP:  115/76  Pulse: 85 79  Resp:  16  Temp:  36.6 C  SpO2:  (!) 86%    Last Pain:  Vitals:   03/27/20 2156  TempSrc:   PainSc: 4                  Akyia Borelli COKER

## 2020-04-01 ENCOUNTER — Ambulatory Visit: Payer: Managed Care, Other (non HMO) | Admitting: Podiatry

## 2020-04-01 ENCOUNTER — Other Ambulatory Visit: Payer: Self-pay

## 2020-04-01 DIAGNOSIS — L97523 Non-pressure chronic ulcer of other part of left foot with necrosis of muscle: Secondary | ICD-10-CM

## 2020-04-01 DIAGNOSIS — L97513 Non-pressure chronic ulcer of other part of right foot with necrosis of muscle: Secondary | ICD-10-CM

## 2020-04-01 DIAGNOSIS — L89890 Pressure ulcer of other site, unstageable: Secondary | ICD-10-CM

## 2020-04-01 MED ORDER — OXYCODONE-ACETAMINOPHEN 5-325 MG PO TABS
1.0000 | ORAL_TABLET | ORAL | 0 refills | Status: DC | PRN
Start: 2020-04-01 — End: 2020-04-29

## 2020-04-01 NOTE — Progress Notes (Signed)
   HPI: 44 y.o. male presenting today status post bilateral debridement with application of skin substitute.  DOS: 03/27/2020.  Patient states that he is doing well.  He does have some pain but otherwise he has been taking the antibiotics and pain medications as prescribed and the dressings have been clean dry and intact since surgery.  No new complaints at this time  Past Medical History:  Diagnosis Date  . Anginal pain ([place])    with covid  . Anxiety   . Asthma   . Dyspnea   . GERD (gastroesophageal reflux disease)   . Headache   . History of kidney stones    LEFT URETERAL STONE  . HTN (hypertension)   . Pancreatitis 2018   GALLBALDDER SLUDGE CAUSED ISSUED RESOLVED  . Pneumonia 07/2019   covid    Physical Exam: General: The patient is alert and oriented x3 in no acute distress.  Dermatology: Full-thickness pressure ulcers noted to the bilateral weightbearing surfaces of the feet across the entire sole of the foot.  Skin substitute graft is intact with staples intact.  No strong malodor noted.  Moderate drainage noted.  No clinical evidence of infection.  Vascular: Palpable pedal pulses bilaterally.  There is some moderate edema edema noted. Capillary refill within normal limits.  Neurological: Epicritic and protective threshold grossly intact bilaterally.   Musculoskeletal Exam: Range of motion within normal limits to all pedal and ankle joints bilateral. Muscle strength 5/5 in all groups bilateral.   Assessment: 1.  Full-thickness pressure ulcers 2.  Pain in bilateral feet 3.  History of COVID-19 hospitalization.  Discharge 12/04/2019 4.  Status post debridement with application of skin substitute bilateral feet.  DOS: 03/27/2020   Plan of Care:  1. Patient evaluated.  2.  Dressings changed today.  Surgical lube applied overlying the graft site followed by nonadherent Adaptic.  Keep clean dry and intact x1 week 3.  Continue minimal weightbearing and postsurgical shoes 4.   Refill prescription for Percocet 5/325 mg 5.  Return to clinic in 1 week   Edrick Kins, DPM [facility]  Dr. Edrick Kins, DPM    [address], [place] [address]                Office [phone]  Fax [phone]

## 2020-04-03 LAB — AEROBIC/ANAEROBIC CULTURE W GRAM STAIN (SURGICAL/DEEP WOUND): Gram Stain: NONE SEEN

## 2020-04-08 ENCOUNTER — Other Ambulatory Visit: Payer: Self-pay

## 2020-04-08 ENCOUNTER — Ambulatory Visit (INDEPENDENT_AMBULATORY_CARE_PROVIDER_SITE_OTHER): Payer: Managed Care, Other (non HMO) | Admitting: Podiatry

## 2020-04-08 DIAGNOSIS — L97523 Non-pressure chronic ulcer of other part of left foot with necrosis of muscle: Secondary | ICD-10-CM

## 2020-04-08 DIAGNOSIS — L97513 Non-pressure chronic ulcer of other part of right foot with necrosis of muscle: Secondary | ICD-10-CM

## 2020-04-08 NOTE — Progress Notes (Signed)
  Subjective:  Patient ID: [name], male    DOB: 1975/09/25,  MRN: [id number]  Chief Complaint  Patient presents with  . Routine Post Op     POV #2 DOS 03/27/2020 EXCISIONAL DEBRIDMENT OF ULCER B/L FEET, APPLICATION OF SKIN GRAFT B/L FEET    DOS: 03/27/2020 Procedure: Bilateral heel excisional debridements with xenograft applications  44 y.o. male returns for post-op check.  Doing okay.  He does have put some weight on to get to and from the bathroom.  He is here with his wife today.  Dressing has been intact and his last visit  Review of Systems: Negative except as noted in the HPI. Denies N/V/F/Ch.   Objective:   Constitutional Well developed. Well nourished.  Vascular Foot warm and well perfused. Capillary refill normal to all digits.   Neurologic Normal speech. Oriented to person, place, and time. Epicritic sensation to light touch grossly present bilaterally.  Dermatologic  bilateral large plantar ulcerations from the heel to the fifth metatarsal base.  Graft take appears to be better in the distal two thirds of the wound as compared to the heel.  The heel has more fibrotic tissue.  No signs of infection no purulence or malodor.  Serous drainage in dressings.  Orthopedic: Tenderness to palpation noted about the surgical site.         Assessment:   1. Ulcerated, foot, left, with necrosis of muscle ([place])   2. Ulcerated, foot, right, with necrosis of muscle ([place])    Plan:  Patient was evaluated and treated and all questions answered.  S/p foot surgery bilaterally -WB Status: NWB as much as possible in surgical shoes -Sutures: Staples left intact -Would like to leave for least 1 more week for further graft incorporation.  Unclear if much has incorporated or will heal on its own in the heel, I am hopeful for the distal portion of the incision.  At next visit if appearance is similar we will consider putting on Santyl for enzymatic debridement between  visits.  Return in about 1 week (around 04/15/2020) for  with me or Dr Amalia Hailey, wound re-check.

## 2020-04-08 NOTE — Patient Instructions (Signed)
Monitor for any signs/symptoms of infection. Signs of an infection could be redness beyond the site of the incision/procedure/wound, foul smelling odor, drainage that is thick and yellow or green, or severe swelling and pain. Call the office immediately if any occur or go directly to the emergency room. Call with any questions/concerns.  Keep dressings clean dry and intact  Try to minimize activity as much as you can

## 2020-04-14 ENCOUNTER — Other Ambulatory Visit: Payer: Self-pay

## 2020-04-14 ENCOUNTER — Ambulatory Visit (INDEPENDENT_AMBULATORY_CARE_PROVIDER_SITE_OTHER): Payer: Managed Care, Other (non HMO) | Admitting: Podiatry

## 2020-04-14 DIAGNOSIS — L97523 Non-pressure chronic ulcer of other part of left foot with necrosis of muscle: Secondary | ICD-10-CM

## 2020-04-14 DIAGNOSIS — L97513 Non-pressure chronic ulcer of other part of right foot with necrosis of muscle: Secondary | ICD-10-CM

## 2020-04-14 DIAGNOSIS — Z9889 Other specified postprocedural states: Secondary | ICD-10-CM

## 2020-04-14 MED ORDER — COLLAGENASE 250 UNIT/GM EX OINT: 1.0000 "application " | TOPICAL_OINTMENT | Freq: Every day | CUTANEOUS | 1 refills | Status: DC

## 2020-04-14 NOTE — Patient Instructions (Addendum)
Change the dressings every other day with the following order: santyl ointment (about the thickness of a nickel) to wound bed, then saline moistened gauze, then dry gauze, abdominal or ABD pads, then gauze wrap and an ACE wrap.   If there is drainage through the dressings before 2 days, then change every day.  Try to rest as much as possible  Monitor for any signs/symptoms of infection. Signs of an infection could be redness beyond the site of the incision/procedure/wound, foul smelling odor, drainage that is thick and yellow or green, or severe swelling and pain. Call the office immediately if any occur or go directly to the emergency room. Call with any questions/concerns.

## 2020-04-15 ENCOUNTER — Encounter: Payer: Self-pay | Admitting: Physical Medicine and Rehabilitation

## 2020-04-15 ENCOUNTER — Encounter
Payer: Managed Care, Other (non HMO) | Attending: Physical Medicine and Rehabilitation | Admitting: Physical Medicine and Rehabilitation

## 2020-04-15 VITALS — BP 111/72 | HR 82 | Temp 98.5°F | Ht 68.0 in | Wt 265.1 lb

## 2020-04-15 DIAGNOSIS — R208 Other disturbances of skin sensation: Secondary | ICD-10-CM

## 2020-04-15 DIAGNOSIS — R234 Changes in skin texture: Secondary | ICD-10-CM | POA: Diagnosis not present

## 2020-04-15 DIAGNOSIS — R269 Unspecified abnormalities of gait and mobility: Secondary | ICD-10-CM | POA: Insufficient documentation

## 2020-04-15 DIAGNOSIS — L89890 Pressure ulcer of other site, unstageable: Secondary | ICD-10-CM

## 2020-04-15 DIAGNOSIS — G7281 Critical illness myopathy: Secondary | ICD-10-CM | POA: Diagnosis not present

## 2020-04-15 DIAGNOSIS — M792 Neuralgia and neuritis, unspecified: Secondary | ICD-10-CM | POA: Diagnosis not present

## 2020-04-15 HISTORY — DX: Other disturbances of skin sensation: R20.8

## 2020-04-15 NOTE — Progress Notes (Signed)
  Subjective:  Patient ID: [name], male    DOB: 23-Jul-1976,  MRN: [id number]  Chief Complaint  Patient presents with  . Routine Post Op    POV #3 DOS 03/27/2020 EXCISIONAL DEBRIDMENT OF ULCER B/L FEET, APPLICATION OF SKIN GRAFT B/L FEET (    DOS: 03/27/2020 Procedure: Bilateral heel excisional debridements with xenograft applications  44 y.o. male returns for post-op check.  Doing okay.   He is here with his wife today.    Review of Systems: Negative except as noted in the HPI. Denies N/V/F/Ch.   Objective:   Constitutional Well developed. Well nourished.  Vascular Foot warm and well perfused. Capillary refill normal to all digits.   Neurologic Normal speech. Oriented to person, place, and time. Epicritic sensation to light touch grossly present bilaterally.  Dermatologic  bilateral large plantar ulcerations from the heel to the fifth metatarsal base.  Graft take appears to be better in the distal two thirds of the wound as compared to the heel, similar in appearance to his last visit.  The heel has more fibrotic tissue.  No signs of infection no purulence or malodor.  Serous drainage in dressings.  Staples intact.  Wound dimensions measure on the left foot 16 cm x 4 cm x 1 cm in depth.  Wound dimensions measure on the right foot 17.5 cm x 5 cm x 1.5 cm  Orthopedic: Tenderness to palpation noted about the surgical site.         Assessment:   1. Ulcerated, foot, left, with necrosis of muscle ([place])   2. Ulcerated, foot, right, with necrosis of muscle ([place])   3. Post-operative state    Plan:  Patient was evaluated and treated and all questions answered.  S/p foot surgery bilaterally -WB Status: NWB as much as possible in surgical shoes -Sutures: Staples Staples removed today as much graft has taken to the wound bed as possible at this point. -Begin using Santyl to enzymatic debride the wound.  Prescription was sent and a co-pay card was given to his wife.  If not  improving within a few weeks will consider further debridement.  Return in about 1 week (around 04/21/2020) for with Dr Amalia Hailey, wound re-check.

## 2020-04-15 NOTE — Progress Notes (Signed)
Subjective:    Patient ID: [name], male    DOB: 1976/04/18, 44 y.o.   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    Flomax- on 0.4 mg daily-    Had skin grafts on feet- has an odor to them.  Went to surgeon yesterday said they looked really well- trimmed what wasn't sticking and removed staples.    Nerve pain-tingling/stinging- only maybe 10-15% improvement-overall-  still having - sharp shooting- not having as much of sharp shooting- barely has the sharp shooting pain anymore.  50-75% improvement of sharp shooting pains.   Still has some foot in L foot-  Still NWB currently overall, but allowed to go to bathroom and bedroom- minimal walking currently- Dr Amalia Hailey-  He drags RW due to so much weight through arms.    Needs FMLA paperwork.  Leave is around to run out on job.    We don't know how much feeling he will get back in feet.   Mood- not worse since changed Lexapro. Gets grumpy because doesn't get stuff done fast enough-    Swelling in feet and ankles has gone way down since surgery.   Got COVID vaccine- x2- no Sx's/complications.   Sleeps well- most of time- last night, couldn't go to sleep.   Slept 3 hrs total last night.     Pain Inventory Average Pain 7 Pain Right Now 5 My pain is intermittent and sharp  LOCATION OF PAIN  Knee and ankle  BOWEL Number of stools per week:4 Oral laxative use na Type of laxative na Enema or suppository use No  History of colostomy No  Incontinent No   BLADDER Normal  Asking why he is taking flomax In and out cath, frequency na Able to self cath No  Bladder incontinence No  Frequent urination No  Leakage with coughing No  Difficulty starting stream No  Incomplete bladder emptying No    Mobility use a walker how many minutes can you walk? 5 ability to climb steps?  yes use a wheelchair  Function I need  assistance with the following:  bathing, toileting, meal prep, household duties and shopping  Neuro/Psych weakness numbness tingling trouble walking loss of taste or smell  Prior Studies Any changes since last visit?  yes  Surgery on his feet  Physicians involved in your care Foot doctor Dr Amalia Hailey   Family History  Problem Relation Age of Onset  . Asthma Brother   . Diabetes Father   . Hypertension Father   . Diabetes Paternal Grandmother   . Hypertension Paternal Grandmother   . Migraines Mother   . GER disease Mother   . Pancreatic cancer Maternal Grandmother   . Heart disease Maternal Grandfather    Social History   Socioeconomic History  . Marital status: Married    Spouse name: Not on file  . Number of children: 1  . Years of education: Not on file  . Highest education level: Not on file  Occupational History  . Occupation: delivery driver  Tobacco Use  . Smoking status: Never Smoker  . Smokeless tobacco: Never Used  Vaping Use  . Vaping Use: Never used  Substance and Sexual Activity  . Alcohol use: Yes    Comment: rarely 2-3 times a year  . Drug use: No  . Sexual activity: Yes    Partners: Female  Other Topics Concern  . Not on file  Social History Narrative  . Not on file   Social Determinants of Health   Financial Resource Strain:   . Difficulty of Paying Living Expenses: Not on file  Food Insecurity:   . Worried About Charity fundraiser in the Last Year: Not on file  . Ran Out of Food in the Last Year: Not on file  Transportation Needs:   . Lack of Transportation (Medical): Not on file  . Lack of Transportation (Non-Medical): Not on file  Physical Activity:   . Days of Exercise per Week: Not on file  . Minutes of Exercise per Session: Not on file  Stress:   . Feeling of Stress : Not on file  Social Connections:   . Frequency of Communication with Friends and Family: Not on file  . Frequency of Social Gatherings with Friends and Family:  Not on file  . Attends Religious Services: Not on file  . Active Member of Clubs or Organizations: Not on file  . Attends Archivist Meetings: Not on file  . Marital Status: Not on file   Past Surgical History:  Procedure Laterality Date  . CANNULATION FOR ECMO (EXTRACORPOREAL MEMBRANE OXYGENATION) N/A 08/28/2019   Procedure: CANNULATION FOR VV ECMO (EXTRACORPOREAL MEMBRANE OXYGENATION);  Surgeon: Prescott Gum, Collier Salina, MD;  Location: [place];  Service: Open Heart Surgery;  Laterality: N/A;  CRESCENT CANNULA  . CANNULATION FOR ECMO (EXTRACORPOREAL MEMBRANE OXYGENATION) N/A 09/10/2019   Procedure: CANNULATION FOR ECMO (EXTRACORPOREAL MEMBRANE OXYGENATION) PUTTING IN CRESCENT 32FR CANNULA  AND REMOVING GROING CANNULATION;  Surgeon: Wonda Olds, MD;  Location: [place];  Service: Open Heart Surgery;  Laterality: N/A;  PUTTING IN CRESCENT/REMOVING GROIN CANNULATION  . CYSTOSCOPY/URETEROSCOPY/HOLMIUM LASER/STENT PLACEMENT Left 04/18/2019   Procedure: LEFT URETEROSCOPY/HOLMIUM LASER/STENT PLACEMENT;  Surgeon: Ardis Hughs, MD;  Location: [facility];  Service: Urology;  Laterality: Left;  . CYSTOSCOPY/URETEROSCOPY/HOLMIUM LASER/STENT PLACEMENT Left 05/02/2019   Procedure: CYSTOSCOPY/URETEROSCOPY/HOLMIUM LASER/STENT EXCHANGE;  Surgeon: Ardis Hughs, MD;  Location: [facility];  Service: Urology;  Laterality: Left;  . ECMO CANNULATION N/A 08/03/2019   Procedure: ECMO CANNULATION;  Surgeon: Wonda Olds, MD;  Location: [name] [facility];  Service: Cardiovascular;  Laterality: N/A;  . ESOPHAGOGASTRODUODENOSCOPY N/A 09/11/2019   Procedure: ESOPHAGOGASTRODUODENOSCOPY (EGD);  Surgeon: Wonda Olds, MD;  Location: [facility];  Service: Thoracic;  Laterality: N/A;  . GRAFT APPLICATION Bilateral 03/21/4817   Procedure: APPLICATION OF SKIN GRAFT BILATERAL FEET;  Surgeon: Edrick Kins, DPM;  Location: [facility];  Service: Podiatry;  Laterality: Bilateral;  . IR REPLC GASTRO/COLONIC  TUBE PERCUT W/FLUORO  10/14/2019  . IRRIGATION AND DEBRIDEMENT SHOULDER Right 09/29/2017   Procedure: IRRIGATION AND DEBRIDEMENT SHOULDER;  Surgeon: Leandrew Koyanagi, MD;  Location: [place];  Service: Orthopedics;  Laterality: Right;  . [place] SURGERY  2002  . NASAL ENDOSCOPY WITH EPISTAXIS CONTROL N/A 08/31/2019   Procedure: NASAL ENDOSCOPY WITH EPISTAXIS CONTROL WITH CAUTERIZATION;  Surgeon: Melida Quitter, MD;  Location: [place];  Service: ENT;  Laterality: N/A;  . PORTACATH PLACEMENT N/A 09/11/2019   Procedure: PEG TUBE INSERTION - BEDSIDE;  Surgeon: Wonda Olds, MD;  Location: [place];  Service: Thoracic;  Laterality: N/A;  . TEE WITHOUT CARDIOVERSION N/A 08/28/2019   Procedure: TRANSESOPHAGEAL ECHOCARDIOGRAM (TEE);  Surgeon: Prescott Gum, Collier Salina, MD;  Location: [place];  Service: Open Heart Surgery;  Laterality: N/A;  . TEE WITHOUT CARDIOVERSION N/A 09/10/2019   Procedure: TRANSESOPHAGEAL  ECHOCARDIOGRAM (TEE);  Surgeon: Wonda Olds, MD;  Location: [place];  Service: Open Heart Surgery;  Laterality: N/A;  . WOUND DEBRIDEMENT Bilateral 03/27/2020   Procedure: EXCISIONAL DEBRIDEMENT OF ULCERS BILATERAL FEET;  Surgeon: Edrick Kins, DPM;  Location[facility];  Service: Podiatry;  Laterality: Bilateral;   Past Medical History:  Diagnosis Date  . Anginal pain ([place])    with covid  . Anxiety   . Asthma   . Dyspnea   . GERD (gastroesophageal reflux disease)   . Headache   . History of kidney stones    LEFT URETERAL STONE  . HTN (hypertension)   . Pancreatitis 2018   GALLBALDDER SLUDGE CAUSED ISSUED RESOLVED  . Pneumonia 07/2019   covid   BP 111/72   Pulse 82   Temp 98.5 F (36.9 C)   Ht 5' 8"  (1.727 m)   Wt 265 lb 2.1 oz (120.3 kg)   SpO2 92%   BMI 40.31 kg/m   Opioid Risk Score:   Fall Risk Score:  `1  Depression screen PHQ 2/9  Depression screen [facility] 2/9 04/15/2020 01/03/2020 12/12/2017 11/01/2017  Decreased Interest - 0 0 0  Down, Depressed, Hopeless - 1 0 0  PHQ - 2 Score - 1 0 0   Altered sleeping - 1 - -  Tired, decreased energy - 2 - -  Change in appetite 1 0 - -  Feeling bad or failure about yourself  1 1 - -  Trouble concentrating - 0 - -  Moving slowly or fidgety/restless - 1 - -  Suicidal thoughts - 0 - -  PHQ-9 Score - 6 - -  Difficult doing work/chores - Somewhat difficult - -    Review of Systems  Constitutional: Positive for unexpected weight change.  HENT: Negative.   Eyes: Negative.   Respiratory: Negative.   Cardiovascular: Negative.   Gastrointestinal: Negative.   Endocrine: Negative.   Genitourinary: Negative.   Musculoskeletal: Negative.   Skin: Negative.   Allergic/Immunologic: Negative.   Neurological: Negative.   Hematological: Negative.   Psychiatric/Behavioral: Negative.   All other systems reviewed and are negative.      Objective:   Physical Exam Awake, alert, appropriate, accompanied by wife, in manual w/c, transport w/c, NAD MS: UEs- deltoid 5-/5, biceps 4/5, triceps- 4+/5 WE 4-/5 on R; 5-/5 on L; grip 4-/5 on R 5-/5 on L; finger abd 4/5 on R 5-/5  LEs- HF 5-/5 B/L, KE 5-/5, KF 5-/5,  R DF 3/5, L DF 2-/5, R PF 3+/5, L PF 2+/5  Neuro: Decreased sensation to light touch from thumb up to elbow B/L Decreased in sensation in LEs below ankle- worse as goes downwards. B/L  B/L feet dressings- C/D/I- with ACE wraps.         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   1. Flomax/Tamsulosin- take every other day for 1-2 weeks- if doing well and no urinary hesitancy, lack of emptying, or hard to start stream, then can stop all together.   2. See if PCP can stop Norvasc/Amlodipine 2.5 mg daily- BP has been running ~ 110s/70s currently.  Can always check BP at home and if OK, can stop.    3. See if can come of Spironolactone- since it's a fluid pill.  Shouldn't really decrease BP.    4.  Suggest Clonazepam making as needed.  5.  Can increase Trazodone if Melatonin doesn't work , will increase trazodone .   6. Can try melatonin 5-10 mg nightly - will not affect meds he's on- so, can take over the counter.   7. Won't take off list quite yet-   8. Ideal BP 100s-130s over 50s-80s.- want it under 140/90-   9.  F/U in 8 weeks  I spent a total of 50 minutes on visit- as detailed above.

## 2020-04-15 NOTE — Patient Instructions (Addendum)
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   1. Flomax/Tamsulosin- take every other day for 1-2 weeks- if doing well and no urinary hesitancy, lack of emptying, or hard to start stream, then can stop all together.   2. See if PCP can stop Norvasc/Amlodipine 2.5 mg daily- BP has been running ~ 110s/70s currently.  Can always check BP at home and if OK, can stop.    3. See if can come of Spironolactone- since it's a fluid pill.  Shouldn't really decrease BP.    4.  Suggest Clonazepam making as needed.   5. Can increase Trazodone if Melatonin doesn't work , will increase trazodone .   6. Can try melatonin 5-10 mg nightly - will not affect meds he's on- so, can take over the counter.   7. Won't take off list quite yet- the meds we are stopping.   8. Ideal BP 100s-130s over 50s-80s.- want it under 140/90-   9.  F/U in 8 weeks

## 2020-04-22 ENCOUNTER — Ambulatory Visit (INDEPENDENT_AMBULATORY_CARE_PROVIDER_SITE_OTHER): Payer: Managed Care, Other (non HMO) | Admitting: Podiatry

## 2020-04-22 ENCOUNTER — Other Ambulatory Visit: Payer: Self-pay

## 2020-04-22 DIAGNOSIS — L97512 Non-pressure chronic ulcer of other part of right foot with fat layer exposed: Secondary | ICD-10-CM | POA: Diagnosis not present

## 2020-04-22 DIAGNOSIS — L97522 Non-pressure chronic ulcer of other 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: Secondary | ICD-10-CM

## 2020-04-22 MED ORDER — DOXYCYCLINE HYCLATE 100 MG PO TABS: 100.0000 mg | ORAL_TABLET | Freq: Two times a day (BID) | ORAL | 0 refills | Status: DC

## 2020-04-24 NOTE — Progress Notes (Signed)
HPI: 44 y.o. male presenting today status post bilateral debridement with application of Acell micromatrix in addition to Cytal wound matrix to the bilateral foot pressure ulcers.  DOS: 03/27/2020.  Patient states that he is doing well.  He was last seen in the office on 04/14/2020 at which time he was prescribed Santyl ointment to begin applying daily to the wound sites.  Patient states that he continues to get some drainage to the right foot.  No new complaints at this time.  He is currently not taking any oral antibiotics.  Past Medical History:  Diagnosis Date  . Anginal pain ([place])    with covid  . Anxiety   . Asthma   . Dyspnea   . GERD (gastroesophageal reflux disease)   . Headache   . History of kidney stones    LEFT URETERAL STONE  . HTN (hypertension)   . Pancreatitis 2018   GALLBALDDER SLUDGE CAUSED ISSUED RESOLVED  . Pneumonia 07/2019   covid    Physical Exam: General: The patient is alert and oriented x3 in no acute distress.  Dermatology: Full-thickness pressure ulcers noted to the bilateral weightbearing surfaces of the feet across the entire sole of the foot.  Wound matrix grafts have completely absorbed/disintegrated into the tissues.  Measurements of the wounds are approximately the same measuring approximately 17 x 5 x 0.6 cm (LxWxD). 2 noted ulcerations there is some intermittent granulation tissue throughout the wound.  There is no exposed bone muscle tendon ligament or joint, with exception of some deeper underlying plantar fascial ligament to the right heel area.  There is also some increased serous drainage noted from this area.  No malodor noted.  No purulence noted.  Periwound integrity is intact.  Vascular: Palpable pedal pulses bilaterally.  There is some minimal edema edema noted. Capillary refill within normal limits.  Neurological: Epicritic and protective threshold grossly intact bilaterally.   Musculoskeletal Exam: Range of motion within normal limits to  all pedal and ankle joints bilateral. Muscle strength 5/5 in all groups bilateral.   Assessment: 1.  Full-thickness pressure ulcers bilateral feet 2.  Pain in bilateral feet 3.  History of COVID-19 hospitalization.  Discharge 12/04/2019 4.  Status post debridement with application of skin substitute bilateral feet.  DOS: 03/27/2020   Plan of Care:  1. Patient evaluated.  2.  Medically necessary excisional debridement including muscle and deep fascial tissue was performed using a tissue nipper.  Excisional debridement of the necrotic nonviable tissue down to healthy bleeding viable tissue was performed with post debridement measurement same as pre-.  Dry sterile dressings applied 3.  Continue Santyl ointment daily to the wounds for enzymatic debridement 4.  Culture was taken of the serous drainage to the right heel.  The patient is currently not on antibiotics and therefore the culture should not be skewed by any oral antibiotic medication 5.  Prescription for doxycycline 100 mg 2 times daily 6.  Today we discussed the possibility of referring the patient over to the wound care center once the wound stabilizes and improves. 7.  Return to clinic in 2 weeks   Edrick Kins, DPM [facility]  Dr. Edrick Kins, DPM    [address], [place] [address]  Office [phone]  Fax [phone]

## 2020-04-27 ENCOUNTER — Ambulatory Visit: Payer: Managed Care, Other (non HMO) | Admitting: Podiatry

## 2020-04-28 ENCOUNTER — Telehealth: Payer: Self-pay

## 2020-04-28 LAB — WOUND CULTURE
MICRO NUMBER:: 10953968
SPECIMEN QUALITY:: ADEQUATE

## 2020-04-28 NOTE — Telephone Encounter (Signed)
Pt called for refill in pain medication

## 2020-04-29 ENCOUNTER — Other Ambulatory Visit: Payer: Self-pay | Admitting: Podiatry

## 2020-04-29 MED ORDER — OXYCODONE-ACETAMINOPHEN 5-325 MG PO TABS
1.0000 | ORAL_TABLET | Freq: Three times a day (TID) | ORAL | 0 refills | Status: DC | PRN
Start: 2020-04-29 — End: 2020-05-20

## 2020-04-29 NOTE — Telephone Encounter (Signed)
Done

## 2020-04-29 NOTE — Progress Notes (Signed)
PRN pain 

## 2020-05-06 ENCOUNTER — Other Ambulatory Visit: Payer: Self-pay

## 2020-05-06 ENCOUNTER — Ambulatory Visit (INDEPENDENT_AMBULATORY_CARE_PROVIDER_SITE_OTHER): Payer: Managed Care, Other (non HMO) | Admitting: Podiatry

## 2020-05-06 DIAGNOSIS — L97512 Non-pressure chronic ulcer of other part of right foot with fat layer exposed: Secondary | ICD-10-CM | POA: Diagnosis not present

## 2020-05-06 DIAGNOSIS — L97522 Non-pressure chronic ulcer of other 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: Secondary | ICD-10-CM

## 2020-05-06 DIAGNOSIS — M79675 Pain in left toe(s): Secondary | ICD-10-CM

## 2020-05-06 DIAGNOSIS — B351 Tinea unguium: Secondary | ICD-10-CM | POA: Diagnosis not present

## 2020-05-06 DIAGNOSIS — M79674 Pain in right toe(s): Secondary | ICD-10-CM

## 2020-05-06 MED ORDER — COLLAGENASE 250 UNIT/GM EX OINT
1.0000 | TOPICAL_OINTMENT | Freq: Every day | CUTANEOUS | 1 refills | Status: DC
Start: 2020-05-06 — End: 2020-07-08

## 2020-05-06 MED ORDER — DOXYCYCLINE HYCLATE 100 MG PO TABS
100.0000 mg | ORAL_TABLET | Freq: Two times a day (BID) | ORAL | 0 refills | Status: DC
Start: 2020-05-06 — End: 2020-05-20

## 2020-05-06 NOTE — Progress Notes (Signed)
   HPI: 44 y.o. male presenting today status post bilateral debridement with application of Acell micromatrix in addition to Cytal wound matrix to the bilateral foot pressure ulcers.  DOS: 03/27/2020.  Patient continues to improve.  He has been taking the oral antibiotic as prescribed.  No new complaints at this time. Past Medical History:  Diagnosis Date  . Anginal pain ([place])    with covid  . Anxiety   . Asthma   . Dyspnea   . GERD (gastroesophageal reflux disease)   . Headache   . History of kidney stones    LEFT URETERAL STONE  . HTN (hypertension)   . Pancreatitis 2018   GALLBALDDER SLUDGE CAUSED ISSUED RESOLVED  . Pneumonia 07/2019   covid    Physical Exam: General: The patient is alert and oriented x3 in no acute distress.  Dermatology: Full-thickness pressure ulcers noted to the bilateral weightbearing surfaces of the feet across the entire sole of the foot.  Wound matrix grafts have completely absorbed/disintegrated into the tissues.  Measurements of the wounds are approximately the same measuring approximately 17 x 5 x 0.6 cm (LxWxD). To the noted ulcerations there is some intermittent granulation tissue throughout the wound.  There is no exposed bone muscle tendon ligament or joint, with exception of some deeper underlying plantar fascial ligament to the right heel area.  There is also some increased serous drainage noted from this area which does appear to be improved since last visit.  No malodor noted.  No purulence noted.  Periwound integrity is intact.  Vascular: Palpable pedal pulses bilaterally.  There is some minimal edema edema noted. Capillary refill within normal limits.  Neurological: Epicritic and protective threshold grossly intact bilaterally.   Musculoskeletal Exam: Range of motion within normal limits to all pedal and ankle joints bilateral. Muscle strength 5/5 in all groups bilateral.   Assessment: 1.  Full-thickness pressure ulcers bilateral feet 2.  Pain  in bilateral feet 3.  History of COVID-19 hospitalization.  Discharge 12/04/2019 4.  Status post debridement with application of skin substitute bilateral feet.  DOS: 03/27/2020   Plan of Care:  1. Patient evaluated.  Cultures reviewed today 2.  Medically necessary excisional debridement including muscle and deep fascial tissue was performed using a tissue nipper.  Excisional debridement of the necrotic nonviable tissue down to healthy bleeding viable tissue was performed with post debridement measurement same as pre-.  Dry sterile dressings applied 3.  Continue Santyl ointment daily to the wounds for enzymatic debridement.  Refill provided 4.  Continue doxycycline 100 mg 2 times daily as prescribed.  Refill provided 5.  Return to clinic in 2 weeks  Edrick Kins, DPM [facility]  Dr. Edrick Kins, DPM    [address], [place] [address]                Office [phone]  Fax [phone]

## 2020-05-20 ENCOUNTER — Other Ambulatory Visit: Payer: Self-pay

## 2020-05-20 ENCOUNTER — Ambulatory Visit (INDEPENDENT_AMBULATORY_CARE_PROVIDER_SITE_OTHER): Payer: Managed Care, Other (non HMO) | Admitting: Podiatry

## 2020-05-20 DIAGNOSIS — L97512 Non-pressure chronic ulcer of other part of right foot with fat layer exposed: Secondary | ICD-10-CM | POA: Diagnosis not present

## 2020-05-20 DIAGNOSIS — L97522 Non-pressure chronic ulcer of other 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: Secondary | ICD-10-CM

## 2020-05-20 MED ORDER — OXYCODONE-ACETAMINOPHEN 5-325 MG PO TABS
1.0000 | ORAL_TABLET | Freq: Three times a day (TID) | ORAL | 0 refills | Status: DC | PRN
Start: 2020-05-20 — End: 2020-06-26

## 2020-05-20 MED ORDER — DOXYCYCLINE HYCLATE 100 MG PO TABS
100.0000 mg | ORAL_TABLET | Freq: Two times a day (BID) | ORAL | 0 refills | Status: DC
Start: 2020-05-20 — End: 2020-06-08

## 2020-05-20 NOTE — Progress Notes (Signed)
   HPI: 44 y.o. male presenting today status post bilateral debridement with application of Acell micromatrix in addition to Cytal wound matrix to the bilateral foot pressure ulcers.  DOS: 03/27/2020.  Patient continues to improve.  He has been taking the oral antibiotic as prescribed.  No new complaints at this time. Past Medical History:  Diagnosis Date  . Anginal pain ([place])    with covid  . Anxiety   . Asthma   . Dyspnea   . GERD (gastroesophageal reflux disease)   . Headache   . History of kidney stones    LEFT URETERAL STONE  . HTN (hypertension)   . Pancreatitis 2018   GALLBALDDER SLUDGE CAUSED ISSUED RESOLVED  . Pneumonia 07/2019   covid    Physical Exam: General: The patient is alert and oriented x3 in no acute distress.  Dermatology: Full-thickness pressure ulcers noted to the bilateral weightbearing surfaces of the feet across the entire sole of the foot.  Wound matrix grafts have completely absorbed/disintegrated into the tissues.  Measurements of the wounds are approximately the same measuring approximately 17 x 5 x 0.6 cm (LxWxD). To the noted ulcerations there is some intermittent granulation tissue throughout the wound.  There is no exposed bone muscle tendon ligament or joint, with exception of some deeper underlying plantar fascial ligament to the right heel area.  There is also some increased serous drainage noted from this area which does appear to be improved since last visit.  No malodor noted.  No purulence noted.  Periwound integrity is intact.  Vascular: Palpable pedal pulses bilaterally.  There is some minimal edema edema noted. Capillary refill within normal limits.  Neurological: Epicritic and protective threshold grossly intact bilaterally.   Musculoskeletal Exam: Range of motion within normal limits to all pedal and ankle joints bilateral. Muscle strength 5/5 in all groups bilateral.   Assessment: 1.  Full-thickness pressure ulcers bilateral feet 2.  Pain  in bilateral feet 3.  History of COVID-19 hospitalization.  Discharge 12/04/2019 4.  Status post debridement with application of skin substitute bilateral feet.  DOS: 03/27/2020   Plan of Care:  1. Patient evaluated.  Cultures reviewed today 2.  Medically necessary excisional debridement including muscle and deep fascial tissue was performed using a tissue nipper.  Excisional debridement of the necrotic nonviable tissue down to healthy bleeding viable tissue was performed with post debridement measurement same as pre-.  Dry sterile dressings applied 3.  Continue Santyl ointment daily to the wounds for enzymatic debridement.  Refill provided 4.  Continue doxycycline 100 mg 2 times daily as prescribed.  Refill provided 5.  Refill prescription for Percocet 5/325 mg  6.  Return to clinic in 3 weeks, at this time if he is approved for skin graft we may apply an office skin grafting.  I explained this to the patient and his spouse and I believe this would be good to help heal his plantar heel wounds.  Edrick Kins, DPM [facility]  Dr. Edrick Kins, DPM    [address], [place] [address]                Office [phone]  Fax [phone]

## 2020-06-02 ENCOUNTER — Telehealth: Payer: Self-pay | Admitting: Podiatry

## 2020-06-02 ENCOUNTER — Ambulatory Visit: Payer: Managed Care, Other (non HMO) | Admitting: Allergy & Immunology

## 2020-06-02 NOTE — Telephone Encounter (Signed)
Patient called in stating he wasn't sure if he should continue to take antibiotics and if so could they be called in as well

## 2020-06-02 NOTE — Telephone Encounter (Signed)
Patient called in requesting refill for oxycodone. Please advise

## 2020-06-04 ENCOUNTER — Ambulatory Visit (INDEPENDENT_AMBULATORY_CARE_PROVIDER_SITE_OTHER): Payer: Managed Care, Other (non HMO) | Admitting: Allergy & Immunology

## 2020-06-04 ENCOUNTER — Other Ambulatory Visit: Payer: Self-pay

## 2020-06-04 ENCOUNTER — Encounter: Payer: Self-pay | Admitting: Allergy & Immunology

## 2020-06-04 ENCOUNTER — Telehealth: Payer: Self-pay | Admitting: Podiatry

## 2020-06-04 VITALS — BP 130/82 | HR 90 | Temp 98.3°F | Resp 16

## 2020-06-04 DIAGNOSIS — J3089 Other allergic rhinitis: Secondary | ICD-10-CM

## 2020-06-04 DIAGNOSIS — J4540 Moderate persistent asthma, uncomplicated: Secondary | ICD-10-CM

## 2020-06-04 MED ORDER — SYMBICORT 160-4.5 MCG/ACT IN AERO
2.0000 | INHALATION_SPRAY | Freq: Every day | RESPIRATORY_TRACT | 5 refills | Status: DC | PRN
Start: 2020-06-04 — End: 2020-11-04

## 2020-06-04 MED ORDER — ALBUTEROL SULFATE HFA 108 (90 BASE) MCG/ACT IN AERS: 2.0000 | INHALATION_SPRAY | Freq: Four times a day (QID) | RESPIRATORY_TRACT | 1 refills | Status: DC | PRN

## 2020-06-04 NOTE — Patient Instructions (Addendum)
1. Moderate persistent asthma, uncomplicated - Lung testing looked fairly decent today.  - We will continue with Symbicort for now.  - Dr. Silas Flood will be able to provide more guidance regarding your long term prognosis.  - I am sure that Dr. Silas Flood is going to do more of a lung function test at his next visit.  - Daily controller medication(s): Symbicort 160/4.60mg two puffs twice daily with spacer - Prior to physical activity: albuterol 2 puffs 10-15 minutes before physical activity. - Rescue medications: albuterol 4 puffs every 4-6 hours as needed - Asthma control goals:  * Full participation in all desired activities (may need albuterol before activity) * Albuterol use two time or less a week on average (not counting use with activity) * Cough interfering with sleep two time or less a month * Oral steroids no more than once a year * No hospitalizations  2. Allergic rhinitis - with increased rhinorrhea - Continue with Nasacort one spray per nostril daily. - Add on Atrovent nasal spray one spray per nostril every 8 hours as needed for runny nose.  - Continue with cetirizine 112mdaily.  3. Return in about 6 months (around 12/03/2020).   Please inform usKoreaf any Emergency Department visits, hospitalizations, or changes in symptoms. Call usKoreaefore going to the ED for breathing or allergy symptoms since we might be able to fit you in for a sick visit. Feel free to contact usKoreanytime with any questions, problems, or concerns.  It was a pleasure to see you and your family again today!  Websites that have reliable patient information: 1. American Academy of Asthma, Allergy, and Immunology: www.aaaai.org 2. Food Allergy Research and Education (FARE): foodallergy.org 3. Mothers of Asthmatics: http://www.asthmacommunitynetwork.org 4. American College of Allergy, Asthma, and Immunology: www.acaai.org   COVID-19 Vaccine Information can be found at:  [email or web address] questions related to vaccine distribution or appointments, please email vaccine@ .com or call [phone]    "Like" usKorean Facebook and Instagram for our latest updates!     HAPPY FALL!     Make sure you are registered to vote! If you have moved or changed any of your contact information, you will need to get this updated before voting!  In some cases, you MAY be able to register to vote online: [email or web address]

## 2020-06-04 NOTE — Progress Notes (Signed)
FOLLOW UP  Date of Service/Encounter:  06/04/20   Assessment:   Moderate persistent asthma, uncomplicated  Allergic rhinitis  Prolonged hospitalization for COVID19 requiring ECMO  Fully vaccinated against COVID19  Plan/Recommendations:   1. Moderate persistent asthma, uncomplicated - Lung testing looked fairly decent today.  - We will continue with Symbicort for now.  - Dr. Silas Flood will be able to provide more guidance regarding your long term prognosis.  - I am sure that Dr. Silas Flood is going to do more of a lung function test at his next visit.  - Daily controller medication(s): Symbicort 160/4.90mg two puffs twice daily with spacer - Prior to physical activity: albuterol 2 puffs 10-15 minutes before physical activity. - Rescue medications: albuterol 4 puffs every 4-6 hours as needed - Asthma control goals:  * Full participation in all desired activities (may need albuterol before activity) * Albuterol use two time or less a week on average (not counting use with activity) * Cough interfering with sleep two time or less a month * Oral steroids no more than once a year * No hospitalizations  2. Allergic rhinitis - with increased rhinorrhea - Continue with Nasacort one spray per nostril daily. - Add on Atrovent nasal spray one spray per nostril every 8 hours as needed for runny nose.  - Continue with cetirizine 141mdaily.  3. Return in about 6 months (around 12/03/2020).   Subjective:   [name] a [age].o. male presenting today for follow up of  Chief Complaint  Patient presents with  . Asthma  . Allergic Rhinitis     [name] MALMSTROMas a history of the following: Patient Active Problem List   Diagnosis Date Noted  . Impaired sensation to light touch 04/15/2020  . Diffuse alveolar damage ([facility]08/09/2019  . Eschar of foot   . 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  . History of COVID-19   . Pressure injury of skin 08/22/2019  . Need for emotional  support   . Chest tube in place   . Personal history of ECMO   . Advanced care planning/counseling discussion   . Palliative care by specialist   . Goals of care, counseling/discussion   . Advanced directives, counseling/discussion   . Subcutaneous crepitus   . Thrombocytopenia ([facility] [place]  . Leukopenia   . Fever   . Gram positive bacterial infection   . Septic arthritis of right acromioclavicular joint (HCNorman02/22/2019  . Chronic left-sided low back pain with left-sided sciatica 09/19/2017  . Epigastric pain   . Essential hypertension 04/17/2017  . Moderate persistent asthma 07/21/2015  . Allergic rhinoconjunctivitis 07/21/2015  . GERD (gastroesophageal reflux disease) 07/21/2015  . Abnormal gait 11/12/2009  . TARSAL TUNNEL SYNDROME, LEFT 10/08/2009  . PES PLANUS 10/08/2009    History obtained from: chart review and patient and his wife.  [name] a [age].o. male presenting for a follow up visit.  I last talked to him in December 2020 for a sick visit.  At that time, he was endorsing 48 hours of congestion, cough, sinus pressure, and discharge.  We recommended treating with prednisone and Mucinex.  I did ask him to call back if he was not feeling any better.  He denied any COVID-19 exposure.  He did end up getting tested for COVID-19 and was hospitalized for quite an extended period of time.  He even required ECMO. He was in the hospital for four months.   Since the discharge from the hospital, he  has done fairly well. Unfortunately, he has not completely recovered and gone back to work. He previously worked at [employer]. But he has been on disability since that time. He is in the process of applying for permanent disability, but he would like to go back to the work force ideally. He is fully vaccinated to [place] (not eligible for the booster yet) and he did get the flu shot.   He is followed by Dr. Larey Days. Last visit in August 2021. He was doing well at the time with  some borderline oxygen. He is not on supplemental oxygen right now. He is going to have full PFTs at the next visit. He reports that he has pressure sores on his bilateral feet. He did have skin grafts. He is followed by Dr. Amalia Hailey at [facility]. He has been doing exercises in the chair.   He does report a couple of weeks of postnasal drip. He does have some stuffiness in the morning and then clear discharge throughout the day. He denies sinus pain. He has been using the Flonase to dry things up.  Asthma/Respiratory Symptom History: He reports that his asthma has been fairly well controlled. The last couple of weeks, he has developed some worsening postnasal drip. He is seeing a Pulmonologist. He remains on Symbicort two puffs twice daily. He does have trouble falling asleep, but does not think that his sleeping issues are secondary to uncontrolled asthma.   Allergic Rhinitis Symptom History: He has not needed antibiotics at all.  He is using Flonase fairly regularly as well as an antihistamine. He was never on allergy shots at all. This Flonase is the only nasal spray that he has ever tried.   Otherwise, there have been no changes to his past medical history, surgical history, family history, or social history.    Review of Systems  Constitutional: Negative.  Negative for chills, fever, malaise/fatigue and weight loss.  HENT: Negative for congestion, ear discharge, ear pain and sinus pain.        Positive for rhinorrhea.   Eyes: Negative for pain, discharge and redness.  Respiratory: Negative for cough, sputum production, shortness of breath and wheezing.   Cardiovascular: Negative.  Negative for chest pain and palpitations.  Gastrointestinal: Negative for abdominal pain, constipation, diarrhea, heartburn, nausea and vomiting.  Skin: Negative.  Negative for itching and rash.  Neurological: Negative for dizziness and headaches.  Endo/Heme/Allergies: Positive for environmental  allergies. Does not bruise/bleed easily.       Objective:   Blood pressure 130/82, pulse 90, temperature 98.3 F (36.8 C), temperature source Temporal, resp. rate 16, SpO2 98 %. There is no height or weight on file to calculate BMI.   Physical Exam:  Physical Exam Constitutional:      Appearance: He is well-developed.     Comments: Came in via wheelchair. Getting around with a walker as well. Talkative.  HENT:     Head: Normocephalic and atraumatic.     Right Ear: Tympanic membrane, ear canal and external ear normal.     Left Ear: Tympanic membrane, ear canal and external ear normal.     Nose: No nasal deformity, septal deviation, mucosal edema or rhinorrhea.     Right Turbinates: Enlarged and swollen.     Left Turbinates: Enlarged and swollen.     Right Sinus: No maxillary sinus tenderness or frontal sinus tenderness.     Left Sinus: No maxillary sinus tenderness or frontal sinus tenderness.  Mouth/Throat:     Mouth: Mucous membranes are not pale and not dry.     Pharynx: Uvula midline.  Eyes:     General:        Right eye: No discharge.        Left eye: No discharge.     Conjunctiva/sclera: Conjunctivae normal.     Right eye: Right conjunctiva is not injected. No chemosis.    Left eye: Left conjunctiva is not injected. No chemosis.    Pupils: Pupils are equal, round, and reactive to light.  Cardiovascular:     Rate and Rhythm: Normal rate and regular rhythm.     Heart sounds: Normal heart sounds.  Pulmonary:     Effort: Pulmonary effort is normal. No tachypnea, accessory muscle usage or respiratory distress.     Breath sounds: Normal breath sounds. No wheezing, rhonchi or rales.     Comments: Moving air well in all lung fields. No increased work of breathing noted.  Chest:     Chest wall: No tenderness.  Lymphadenopathy:     Cervical: No cervical adenopathy.  Skin:    Coloration: Skin is not pale.     Findings: No abrasion, erythema, petechiae or rash. Rash is  not papular, urticarial or vesicular.     Comments: No eczematous or urticarial lesions noted.   Neurological:     Mental Status: He is alert.      Diagnostic studies:    Spirometry: results abnormal (FEV1: 2.19/60%, FVC: 2.65/57%, FEV1/FVC: 83%).    Spirometry consistent with possible restrictive disease.   Allergy Studies: none        Salvatore Marvel, MD  [facility]

## 2020-06-04 NOTE — Telephone Encounter (Signed)
Pharmacy called stating the collagenase (SANTYL) ointment is missing the day supply. Call back # [phone]  Please advise.

## 2020-06-06 ENCOUNTER — Other Ambulatory Visit: Payer: Self-pay | Admitting: Podiatry

## 2020-06-07 ENCOUNTER — Other Ambulatory Visit: Payer: Self-pay | Admitting: Physical Medicine and Rehabilitation

## 2020-06-07 NOTE — Telephone Encounter (Signed)
Please advise 

## 2020-06-10 ENCOUNTER — Other Ambulatory Visit: Payer: Self-pay

## 2020-06-10 ENCOUNTER — Encounter: Payer: Managed Care, Other (non HMO) | Admitting: Physical Medicine and Rehabilitation

## 2020-06-10 ENCOUNTER — Ambulatory Visit (INDEPENDENT_AMBULATORY_CARE_PROVIDER_SITE_OTHER): Payer: Managed Care, Other (non HMO) | Admitting: Podiatry

## 2020-06-10 DIAGNOSIS — L97522 Non-pressure chronic ulcer of other 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: Secondary | ICD-10-CM

## 2020-06-10 DIAGNOSIS — L97512 Non-pressure chronic ulcer of other part of right foot with fat layer exposed: Secondary | ICD-10-CM | POA: Diagnosis not present

## 2020-06-10 NOTE — Progress Notes (Signed)
HPI: 44 y.o. male presenting today status post bilateral debridement with application of Acell micromatrix in addition to Cytal wound matrix to the bilateral foot pressure ulcers.  DOS: 03/27/2020.  Patient states that he is continuing to improve.  He completed his oral antibiotics as prescribed.  No new complaints at this time  Past Medical History:  Diagnosis Date  . Anginal pain ([place])    with covid  . Anxiety   . Asthma   . Dyspnea   . GERD (gastroesophageal reflux disease)   . Headache   . History of kidney stones    LEFT URETERAL STONE  . HTN (hypertension)   . Pancreatitis 2018   GALLBALDDER SLUDGE CAUSED ISSUED RESOLVED  . Pneumonia 07/2019   covid    Physical Exam: General: The patient is alert and oriented x3 in no acute distress.  Dermatology: Full-thickness pressure ulcers noted to the bilateral weightbearing surfaces of the feet across the entire sole of the foot.  Wound matrix grafts have completely absorbed/disintegrated into the tissues.  Measurements of the wounds are approximately the same measuring approximately 12 x 5 x 1.0 cm (LxWxD). To the noted ulcerations there is some intermittent granulation tissue throughout the wound.  There is no exposed bone muscle tendon ligament or joint, with exception of some deeper underlying plantar fascial ligament to the right heel area.  There is also some increased serous drainage noted from this area which does appear to be improved since last visit.  No malodor noted.  No purulence noted.  Periwound integrity is intact.  Vascular: Palpable pedal pulses bilaterally.  There is some minimal edema edema noted. Capillary refill within normal limits.  Neurological: Epicritic and protective threshold grossly intact bilaterally.   Musculoskeletal Exam: Range of motion within normal limits to all pedal and ankle joints bilateral. Muscle strength 5/5 in all groups bilateral.   Assessment: 1.  Full-thickness pressure ulcers bilateral  feet 2.  Pain in bilateral feet 3.  History of COVID-19 hospitalization.  Discharge 12/04/2019 4.  Status post debridement with application of skin substitute bilateral feet.  DOS: 03/27/2020   Plan of Care:  1. Patient evaluated.  Cultures reviewed today 2.  Medically necessary excisional debridement including muscle and deep fascial tissue was performed using a tissue nipper.  Excisional debridement of the necrotic nonviable tissue down to healthy bleeding viable tissue was performed with post debridement measurement same as pre-.  Dry sterile dressings applied 3.  Continue Santyl ointment daily to the wounds for enzymatic debridement.  Refill provided 4.  Patient has completed his oral doxycycline.  No clinical evidence of infection.  No refill provided today. 5.  Skin graft was requested however the patient's insurance would not cover it here in the office apparently.  Today I am going to refer the patient to the [facility] for evaluation and a second opinion.  There consultation and management would be greatly appreciated.  I do feel that the patient would benefit from outpatient graft application, however I will trust in the recommendations of the wound care center. 6.  Referral placed for [place].  7.  Return to clinic as needed  Edrick Kins, DPM [facility]  Dr. Edrick Kins, DPM    [address]  [place], [place] [address]                Office [phone]  Fax [phone]

## 2020-06-22 ENCOUNTER — Telehealth: Payer: Self-pay | Admitting: Podiatry

## 2020-06-22 NOTE — Telephone Encounter (Signed)
t would like a refill on pain meds

## 2020-06-25 ENCOUNTER — Telehealth: Payer: Self-pay | Admitting: Podiatry

## 2020-06-25 NOTE — Telephone Encounter (Signed)
Patient has requested refill for oxycodone, please advise

## 2020-06-26 ENCOUNTER — Other Ambulatory Visit: Payer: Self-pay | Admitting: Podiatry

## 2020-06-26 MED ORDER — OXYCODONE-ACETAMINOPHEN 5-325 MG PO TABS
1.0000 | ORAL_TABLET | Freq: Three times a day (TID) | ORAL | 0 refills | Status: DC | PRN
Start: 2020-06-26 — End: 2020-07-17

## 2020-06-26 NOTE — Progress Notes (Signed)
PRN chronic b/l foot pain

## 2020-07-01 ENCOUNTER — Other Ambulatory Visit: Payer: Self-pay

## 2020-07-01 ENCOUNTER — Encounter: Payer: Self-pay | Admitting: Physical Medicine and Rehabilitation

## 2020-07-01 ENCOUNTER — Encounter
Payer: Managed Care, Other (non HMO) | Attending: Physical Medicine and Rehabilitation | Admitting: Physical Medicine and Rehabilitation

## 2020-07-01 VITALS — BP 152/85 | HR 73 | Temp 98.6°F | Ht 68.0 in | Wt 285.0 lb

## 2020-07-01 DIAGNOSIS — R234 Changes in skin texture: Secondary | ICD-10-CM | POA: Diagnosis present

## 2020-07-01 DIAGNOSIS — T85693A Other mechanical complication of artificial skin graft and decellularized allodermis, initial encounter: Secondary | ICD-10-CM | POA: Insufficient documentation

## 2020-07-01 DIAGNOSIS — G7281 Critical illness myopathy: Secondary | ICD-10-CM | POA: Diagnosis not present

## 2020-07-01 DIAGNOSIS — F329 Major depressive disorder, single episode, unspecified: Secondary | ICD-10-CM

## 2020-07-01 DIAGNOSIS — T85693S Other mechanical complication of artificial skin graft and decellularized allodermis, sequela: Secondary | ICD-10-CM

## 2020-07-01 DIAGNOSIS — J8409 Other alveolar and parieto-alveolar conditions: Secondary | ICD-10-CM

## 2020-07-01 HISTORY — DX: Major depressive disorder, single episode, unspecified: F32.9

## 2020-07-01 HISTORY — DX: Other mechanical complication of artificial skin graft and decellularized allodermis, initial encounter: T85.693A

## 2020-07-01 MED ORDER — CITALOPRAM HYDROBROMIDE 20 MG PO TABS: 20.0000 mg | ORAL_TABLET | Freq: Every day | ORAL | 5 refills | Status: DC

## 2020-07-01 MED ORDER — TRAZODONE HCL 100 MG PO TABS
100.0000 mg | ORAL_TABLET | Freq: Every day | ORAL | 5 refills | Status: DC
Start: 2020-07-01 — End: 2020-07-01

## 2020-07-01 MED ORDER — AMLODIPINE BESYLATE 2.5 MG PO TABS: 2.5000 mg | ORAL_TABLET | Freq: Every day | ORAL | 5 refills | Status: DC

## 2020-07-01 MED ORDER — BELBUCA 75 MCG BU FILM: 75.0000 ug | ORAL_FILM | Freq: Two times a day (BID) | BUCCAL | 0 refills | Status: DC | PRN

## 2020-07-01 MED ORDER — TRAZODONE HCL 100 MG PO TABS: 100.0000 mg | ORAL_TABLET | Freq: Every day | ORAL | 5 refills | Status: DC

## 2020-07-01 NOTE — Patient Instructions (Addendum)
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    1. Will restart Norvasc/Amlodipine 2.5 mg daily. Due to elevated BP.   2. Take over his medications and putting them in medicine box. Give help initially- get to know your meds  3. Start Celexa/Citalopram 20 mg daily- for mood- max dose I will do when on Duloxetine.   4. Suggest breaking Oxycodone in half- WHEN possible.   5. Belbuca film 75 mcg up to 2x/day as needed- put inside your mouth/cheek and it dissolves- to see IF it can take the place of Oxycodone. It's a low dose, so will see. 7 day supply-   6. Increase Trazodone 100-200 mg nightly- depends on how much needs to increase dose.   7. 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  No caffeine past 3pm. By 9pm, caffeine 50% of out of system.  8.   F/U in 2-3 months- call me if you need assistance of ANY kind!  9. MAKE LISTS! OF TO DO! Some task daily. Use bike as well.   10. Gottman's theory for couples/intimacy.  Just suggesting it.

## 2020-07-01 NOTE — Progress Notes (Signed)
Subjective:    Patient ID: [name], male    DOB: 1976-07-05, 44 y.o.   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    No more surgery on feet- s/p skin grafts.   Stopped Flomax OK.  Sleep not going well- never started Melatonin-  Doesn't remember if went up on Trazodone.   Takes oxycodone before bed for pain. It plays a role for sleep.   Wife concerned that still taking oxycodone- would like to reduce.   Starting Friday night- while out-started feeling dizzy/lightheaded- like would vomit, queasy/woozy-  Started back in the car- ended up vomiting.  Got home-took meds- slept "ok"-  Saturday AM- started up again in car- started feeling again in parking lot-  Vomited again.  BP- 158/105- at the time.  Mixed meds up per wife- mixed up Am and PM as well during this time.    Is sitting on cough all day- not using LB bike.  Per wife- it's hard getting motivated.   Nerve pain- about the same- 7/10- on average.  Terrible pain in L>R hands- on occasion- throbbing/aching pain.    Weight also back up to 285 lbs. Creeping back up  Admits could be depressed- just has no motivation.   Not allowed to walk much because not ALLOWED to stand for pressure on feet- has been referred  Wound care center.    Marriage counseling- intimacy stuff.  Memory not great- not great short term per pt- but wife says no.   Taking Percocet 2x/day.  Might need surgery again on feet.  Has tried Norco- doesn't do much for him.   Feels like in a fog.     Pain Inventory Average Pain 5 Pain Right Now 4 My pain is intermittent, constant, sharp and tingling  In the last 24 hours, has pain interfered with the following? General activity 8 Relation with others 0 Enjoyment of life 5 What TIME of day is your pain at its worst? varies Sleep (in general) fair to poor  Pain is worse with: walking, standing and  some activites Pain improves with: rest and medication Relief from Meds: 8  Family History  Problem Relation Age of Onset  . Asthma Brother   . Diabetes Father   . Hypertension Father   . Diabetes Paternal Grandmother   . Hypertension Paternal Grandmother   . Migraines Mother   . GER disease Mother   . Pancreatic cancer Maternal Grandmother   . Heart disease Maternal Grandfather    Social History   Socioeconomic History  . Marital status: Married    Spouse name: Not on file  . Number of children: 1  . Years of education: Not on file  . Highest education level: Not on file  Occupational History  . Occupation: delivery driver  Tobacco Use  . Smoking status: Never Smoker  . Smokeless tobacco: Never Used  Vaping Use  . Vaping Use: Never used  Substance and Sexual Activity  . Alcohol use: Yes    Comment: rarely 2-3 times a year  . Drug use: No  . Sexual activity: Yes    Partners: Female  Other Topics Concern  . Not on file  Social History Narrative  . Not on file   Social Determinants of Health   Financial Resource Strain:   . Difficulty of Paying Living Expenses: Not on file  Food  Insecurity:   . Worried About Charity fundraiser in the Last Year: Not on file  . Ran Out of Food in the Last Year: Not on file  Transportation Needs:   . Lack of Transportation (Medical): Not on file  . Lack of Transportation (Non-Medical): Not on file  Physical Activity:   . Days of Exercise per Week: Not on file  . Minutes of Exercise per Session: Not on file  Stress:   . Feeling of Stress : Not on file  Social Connections:   . Frequency of Communication with Friends and Family: Not on file  . Frequency of Social Gatherings with Friends and Family: Not on file  . Attends Religious Services: Not on file  . Active Member of Clubs or Organizations: Not on file  . Attends Archivist Meetings: Not on file  . Marital Status: Not on file   Past Surgical History:   Procedure Laterality Date  . CANNULATION FOR ECMO (EXTRACORPOREAL MEMBRANE OXYGENATION) N/A 08/28/2019   Procedure: CANNULATION FOR VV ECMO (EXTRACORPOREAL MEMBRANE OXYGENATION);  Surgeon: Prescott Gum, Collier Salina, MD;  Location: [place];  Service: Open Heart Surgery;  Laterality: N/A;  CRESCENT CANNULA  . CANNULATION FOR ECMO (EXTRACORPOREAL MEMBRANE OXYGENATION) N/A 09/10/2019   Procedure: CANNULATION FOR ECMO (EXTRACORPOREAL MEMBRANE OXYGENATION) PUTTING IN CRESCENT 32FR CANNULA  AND REMOVING GROING CANNULATION;  Surgeon: Wonda Olds, MD;  Location: [place];  Service: Open Heart Surgery;  Laterality: N/A;  PUTTING IN CRESCENT/REMOVING GROIN CANNULATION  . CYSTOSCOPY/URETEROSCOPY/HOLMIUM LASER/STENT PLACEMENT Left 04/18/2019   Procedure: LEFT URETEROSCOPY/HOLMIUM LASER/STENT PLACEMENT;  Surgeon: Ardis Hughs, MD;  Location: [facility];  Service: Urology;  Laterality: Left;  . CYSTOSCOPY/URETEROSCOPY/HOLMIUM LASER/STENT PLACEMENT Left 05/02/2019   Procedure: CYSTOSCOPY/URETEROSCOPY/HOLMIUM LASER/STENT EXCHANGE;  Surgeon: Ardis Hughs, MD;  Location: [facility];  Service: Urology;  Laterality: Left;  . ECMO CANNULATION N/A 08/03/2019   Procedure: ECMO CANNULATION;  Surgeon: Wonda Olds, MD;  Location: [facility];  Service: Cardiovascular;  Laterality: N/A;  . ESOPHAGOGASTRODUODENOSCOPY N/A 09/11/2019   Procedure: ESOPHAGOGASTRODUODENOSCOPY (EGD);  Surgeon: Wonda Olds, MD;  Location[facility];  Service: Thoracic;  Laterality: N/A;  . GRAFT APPLICATION Bilateral 11/21/6061   Procedure: APPLICATION OF SKIN GRAFT BILATERAL FEET;  Surgeon: Edrick Kins, DPM;  Location: [facility];  Service: Podiatry;  Laterality: Bilateral;  . IR REPLC GASTRO/COLONIC TUBE PERCUT W/FLUORO  10/14/2019  . IRRIGATION AND DEBRIDEMENT SHOULDER Right 09/29/2017   Procedure: IRRIGATION AND DEBRIDEMENT SHOULDER;  Surgeon: Leandrew Koyanagi, MD;  Location: [place];  Service: Orthopedics;  Laterality: Right;   . [place] SURGERY  2002  . NASAL ENDOSCOPY WITH EPISTAXIS CONTROL N/A 08/31/2019   Procedure: NASAL ENDOSCOPY WITH EPISTAXIS CONTROL WITH CAUTERIZATION;  Surgeon: Melida Quitter, MD;  Location: [place];  Service: ENT;  Laterality: N/A;  . PORTACATH PLACEMENT N/A 09/11/2019   Procedure: PEG TUBE INSERTION - BEDSIDE;  Surgeon: Wonda Olds, MD;  Location: [place];  Service: Thoracic;  Laterality: N/A;  . TEE WITHOUT CARDIOVERSION N/A 08/28/2019   Procedure: TRANSESOPHAGEAL ECHOCARDIOGRAM (TEE);  Surgeon: Prescott Gum, Collier Salina, MD;  Location: [place];  Service: Open Heart Surgery;  Laterality: N/A;  . TEE WITHOUT CARDIOVERSION N/A 09/10/2019   Procedure: TRANSESOPHAGEAL ECHOCARDIOGRAM (TEE);  Surgeon: Wonda Olds, MD;  Location: [place];  Service: Open Heart Surgery;  Laterality: N/A;  . WOUND DEBRIDEMENT Bilateral 03/27/2020   Procedure: EXCISIONAL DEBRIDEMENT OF ULCERS BILATERAL FEET;  Surgeon: Edrick Kins, DPM;  [facility];  Service: Podiatry;  Laterality: Bilateral;   Past Surgical History:  Procedure Laterality Date  . CANNULATION FOR ECMO (EXTRACORPOREAL MEMBRANE OXYGENATION) N/A 08/28/2019   Procedure: CANNULATION FOR VV ECMO (EXTRACORPOREAL MEMBRANE OXYGENATION);  Surgeon: Prescott Gum, Collier Salina, MD;  Location: [place];  Service: Open Heart Surgery;  Laterality: N/A;  CRESCENT CANNULA  . CANNULATION FOR ECMO (EXTRACORPOREAL MEMBRANE OXYGENATION) N/A 09/10/2019   Procedure: CANNULATION FOR ECMO (EXTRACORPOREAL MEMBRANE OXYGENATION) PUTTING IN CRESCENT 32FR CANNULA  AND REMOVING GROING CANNULATION;  Surgeon: Wonda Olds, MD;  Location: [place];  Service: Open Heart Surgery;  Laterality: N/A;  PUTTING IN CRESCENT/REMOVING GROIN CANNULATION  . CYSTOSCOPY/URETEROSCOPY/HOLMIUM LASER/STENT PLACEMENT Left 04/18/2019   Procedure: LEFT URETEROSCOPY/HOLMIUM LASER/STENT PLACEMENT;  Surgeon: Ardis Hughs, MD;  Location: [facility];  Service: Urology;  Laterality: Left;  .  CYSTOSCOPY/URETEROSCOPY/HOLMIUM LASER/STENT PLACEMENT Left 05/02/2019   Procedure: CYSTOSCOPY/URETEROSCOPY/HOLMIUM LASER/STENT EXCHANGE;  Surgeon: Ardis Hughs, MD;  [facility];  Service: Urology;  Laterality: Left;  . ECMO CANNULATION N/A 08/03/2019   Procedure: ECMO CANNULATION;  Surgeon: Wonda Olds, MD;  Location: [place] [facility];  Service: Cardiovascular;  Laterality: N/A;  . ESOPHAGOGASTRODUODENOSCOPY N/A 09/11/2019   Procedure: ESOPHAGOGASTRODUODENOSCOPY (EGD);  Surgeon: Wonda Olds, MD;  Location[facility];  Service: Thoracic;  Laterality: N/A;  . GRAFT APPLICATION Bilateral 0/35/0093   Procedure: APPLICATION OF SKIN GRAFT BILATERAL FEET;  Surgeon: Edrick Kins, DPM;  [facility];  Service: Podiatry;  Laterality: Bilateral;  . IR REPLC GASTRO/COLONIC TUBE PERCUT W/FLUORO  10/14/2019  . IRRIGATION AND DEBRIDEMENT SHOULDER Right 09/29/2017   Procedure: IRRIGATION AND DEBRIDEMENT SHOULDER;  Surgeon: Leandrew Koyanagi, MD;  Location: [place];  Service: Orthopedics;  Laterality: Right;  . [place] SURGERY  2002  . NASAL ENDOSCOPY WITH EPISTAXIS CONTROL N/A 08/31/2019   Procedure: NASAL ENDOSCOPY WITH EPISTAXIS CONTROL WITH CAUTERIZATION;  Surgeon: Melida Quitter, MD;  Location: [place];  Service: ENT;  Laterality: N/A;  . PORTACATH PLACEMENT N/A 09/11/2019   Procedure: PEG TUBE INSERTION - BEDSIDE;  Surgeon: Wonda Olds, MD;  Location: [place];  Service: Thoracic;  Laterality: N/A;  . TEE WITHOUT CARDIOVERSION N/A 08/28/2019   Procedure: TRANSESOPHAGEAL ECHOCARDIOGRAM (TEE);  Surgeon: Prescott Gum, Collier Salina, MD;  Location: [place];  Service: Open Heart Surgery;  Laterality: N/A;  . TEE WITHOUT CARDIOVERSION N/A 09/10/2019   Procedure: TRANSESOPHAGEAL ECHOCARDIOGRAM (TEE);  Surgeon: Wonda Olds, MD;  Location: [place];  Service: Open Heart Surgery;  Laterality: N/A;  . WOUND DEBRIDEMENT Bilateral 03/27/2020   Procedure: EXCISIONAL DEBRIDEMENT OF ULCERS BILATERAL FEET;  Surgeon:  Edrick Kins, DPM;  [facility];  Service: Podiatry;  Laterality: Bilateral;   Past Medical History:  Diagnosis Date  . Anginal pain ([place])    with covid  . Anxiety   . Asthma   . Dyspnea   . GERD (gastroesophageal reflux disease)   . Headache   . History of kidney stones    LEFT URETERAL STONE  . HTN (hypertension)   . Pancreatitis 2018   GALLBALDDER SLUDGE CAUSED ISSUED RESOLVED  . Pneumonia 07/2019   covid   BP (!) 152/85   Pulse 73   Temp 98.6 F (37 C)   Ht 5' 8"  (1.727 m)   Wt 285 lb (129.3 kg)   BMI 43.33 kg/m   Opioid Risk Score:   Fall Risk Score:  `1  Depression screen PHQ 2/9  Depression screen [facility] 2/9 07/01/2020  04/15/2020 01/03/2020 12/12/2017 11/01/2017  Decreased Interest 0 - 0 0 0  Down, Depressed, Hopeless 0 - 1 0 0  PHQ - 2 Score 0 - 1 0 0  Altered sleeping - - 1 - -  Tired, decreased energy - - 2 - -  Change in appetite - 1 0 - -  Feeling bad or failure about yourself  - 1 1 - -  Trouble concentrating - - 0 - -  Moving slowly or fidgety/restless - - 1 - -  Suicidal thoughts - - 0 - -  PHQ-9 Score - - 6 - -  Difficult doing work/chores - - Somewhat difficult - -  Some recent data might be hidden   Review of Systems An entire ROS was completed and was negative except for HPI    Objective:   Physical Exam  Awake, alert, appropriate, has RW- for transfers, NAD   MS: HFs 4+/5, KE 4+/5, DF on R 4-/5, L is 05; PF 4+/5  Neuro: Still numb in forearms and hands/thumbs as well B/L posterior calves and feet are decreased- (getting some feeling in toes)        Assessment & Plan:   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    1. Will restart Norvasc/Amlodipine 2.5 mg daily. Due to elevated BP.   2. Take over his medications and putting them in medicine box. Give help initially- get to know your meds  3. Start Celexa/Citalopram 20 mg daily- for mood- max dose I will do  when on Duloxetine.   4. Suggest breaking Oxycodone in half- WHEN possible.   5. Belbuca film 75 mcg up to 2x/day as needed- put inside your mouth/cheek and it dissolves- to see IF it can take the place of Oxycodone. It's a low dose, so will see. 7 day supply-   6. Increase Trazodone 100-200 mg nightly- depends on how much needs to increase dose.   7. 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  No caffeine past 3pm. By 9pm, caffeine 50% of out of system. - lists- to do.    8.   F/U in 2-3 months   I spent a total of 45 minutes on visit. As detailed above.

## 2020-07-07 ENCOUNTER — Telehealth: Payer: Self-pay

## 2020-07-07 ENCOUNTER — Telehealth: Payer: Self-pay | Admitting: Podiatry

## 2020-07-07 NOTE — Telephone Encounter (Signed)
Mr. Cicio called:   He would like to speak to you about his current medications.  He said he was already taking Amlodipine on his last office visit.   Call back phone number [phone]. Thank you

## 2020-07-07 NOTE — Telephone Encounter (Signed)
Patient has requested refill for Santyl ointment, please advise

## 2020-07-08 ENCOUNTER — Other Ambulatory Visit: Payer: Self-pay | Admitting: Podiatry

## 2020-07-08 MED ORDER — COLLAGENASE 250 UNIT/GM EX OINT
1.0000 | TOPICAL_OINTMENT | Freq: Every day | CUTANEOUS | 1 refills | Status: DC
Start: 2020-07-08 — End: 2020-07-29

## 2020-07-08 NOTE — Telephone Encounter (Signed)
Pt reports actually was taking Norvasc/Amlodipine- Explained I restarted because BP was high at last appointment- suggested if still taking BP meds, then call PCP to see if they have any suggestions for BP control.   Also reminded him to pick up Belbuca and Citalopram.

## 2020-07-08 NOTE — Telephone Encounter (Signed)
Rx sent. - Dr. Amalia Hailey

## 2020-07-09 ENCOUNTER — Telehealth: Payer: Self-pay | Admitting: Podiatry

## 2020-07-09 NOTE — Telephone Encounter (Signed)
Patient called and stated that he is running low on Santyl ointment for his wound. He can't get any until january. He wanted to know if Dr. Amalia Hailey can override the prescriptions so he can get it sooner so that the insurance will cover it.

## 2020-07-15 ENCOUNTER — Other Ambulatory Visit: Payer: Self-pay

## 2020-07-15 ENCOUNTER — Encounter (HOSPITAL_BASED_OUTPATIENT_CLINIC_OR_DEPARTMENT_OTHER): Payer: Managed Care, Other (non HMO) | Attending: Physician Assistant | Admitting: Physician Assistant

## 2020-07-15 DIAGNOSIS — Z8616 Personal history of COVID-19: Secondary | ICD-10-CM | POA: Insufficient documentation

## 2020-07-15 DIAGNOSIS — L89893 Pressure ulcer of other site, stage 3: Secondary | ICD-10-CM | POA: Diagnosis not present

## 2020-07-15 DIAGNOSIS — M6281 Muscle weakness (generalized): Secondary | ICD-10-CM | POA: Diagnosis not present

## 2020-07-15 DIAGNOSIS — I10 Essential (primary) hypertension: Secondary | ICD-10-CM | POA: Diagnosis not present

## 2020-07-15 NOTE — Progress Notes (Signed)
KENDELL, SAGRAVES ([id number]) Visit Report for 07/15/2020 Chief Complaint Document Details Patient Name: Date of Service: [name], [name] 07/15/2020 9:00 A M Medical Record Number: [id number] Patient Account Number: [id number] Date of Birth/Sex: Treating RN: 1976/06/08 (44 y.o. [name] Primary Care Provider: Jan Fireman Other Clinician: Referring Provider: Treating Provider/Extender: Vira Agar, [place] URA Weeks in Treatment: 0 Information Obtained from: Patient Chief Complaint Bilateral Plantar Foot Ulcers Electronic Signature(s) Signed: 07/15/2020 10:38:41 AM By: Worthy Keeler PA-C Entered By: Worthy Keeler on 07/15/2020 10:38:40 -------------------------------------------------------------------------------- Debridement Details Patient Name: Date of Service: [name], [name] 07/15/2020 9:00 A M Medical Record Number: [id number] Patient Account Number: [id number] Date of Birth/Sex: Treating RN: 05-03-1976 (44 y.o. [name] Primary Care Provider: Jan Fireman Other Clinician: Referring Provider: Treating Provider/Extender: Vira Agar, [place] URA Weeks in Treatment: 0 Debridement Performed for Assessment: Wound #3 Right,Plantar Foot Performed By: Physician Worthy Keeler, PA Debridement Type: Debridement Level of Consciousness (Pre-procedure): Awake and Alert Pre-procedure Verification/Time Out Yes - 10:40 Taken: Start Time: 10:45 Pain Control: Other : benzocaine 20% spray T Area Debrided (L x W): otal 0.4 (cm) x 0.4 (cm) = 0.16 (cm) Tissue and other material debrided: Viable, Non-Viable, Subcutaneous, Skin: Epidermis Level: Skin/Subcutaneous Tissue Debridement Description: Excisional Instrument: Curette Bleeding: Minimum Hemostasis Achieved: Pressure Procedural Pain: 3 Post Procedural Pain: 0 Response to Treatment: Procedure was tolerated well Level of Consciousness (Post- Awake and Alert procedure): Post Debridement  Measurements of Total Wound Length: (cm) 0.4 Stage: Category/Stage III Width: (cm) 0.4 Depth: (cm) 0.1 Volume: (cm) 0.013 Character of Wound/Ulcer Post Debridement: Improved Post Procedure Diagnosis Same as Pre-procedure Electronic Signature(s) Signed: 07/15/2020 4:01:55 PM By: Worthy Keeler PA-C Signed: 07/15/2020 6:12:31 PM By: Baruch Gouty RN, BSN Entered By: Baruch Gouty on 07/15/2020 10:47:28 -------------------------------------------------------------------------------- Debridement Details Patient Name: Date of Service: [name], [name] 07/15/2020 9:00 A M Medical Record Number: [id number] Patient Account Number: [id number] Date of Birth/Sex: Treating RN: [birth year] (44 y.o. [name] Primary Care Provider: Jan Fireman Other Clinician: Referring Provider: Treating Provider/Extender: Vira Agar, LA URA Weeks in Treatment: 0 Debridement Performed for Assessment: Wound #1 Right Calcaneus Performed By: Physician Worthy Keeler, PA Debridement Type: Debridement Level of Consciousness (Pre-procedure): Awake and Alert Pre-procedure Verification/Time Out Yes - 10:40 Taken: Start Time: 10:45 Pain Control: Other : benzocaine 20% spray T Area Debrided (L x W): otal 4 (cm) x 3.3 (cm) = 13.2 (cm) Tissue and other material debrided: Viable, Non-Viable, Slough, Subcutaneous, Slough Level: Skin/Subcutaneous Tissue Debridement Description: Excisional Instrument: Curette Bleeding: Minimum Hemostasis Achieved: Pressure End Time: 10:48 Procedural Pain: 3 Post Procedural Pain: 0 Response to Treatment: Procedure was not tolerated well Comments: too painful Level of Consciousness (Post- Awake and Alert procedure): Post Debridement Measurements of Total Wound Length: (cm) 12.2 Stage: Category/Stage III Width: (cm) 3.3 Depth: (cm) 1 Volume: (cm) 31.62 Character of Wound/Ulcer Post Debridement: Improved Post Procedure Diagnosis Same as  Pre-procedure Electronic Signature(s) Signed: 07/15/2020 4:01:55 PM By: Worthy Keeler PA-C Signed: 07/15/2020 6:12:31 PM By: Baruch Gouty RN, BSN Entered By: Baruch Gouty on 07/15/2020 10:49:12 -------------------------------------------------------------------------------- Debridement Details Patient Name: Date of Service: [name], [name] 07/15/2020 9:00 A M Medical Record Number: [id number] Patient Account Number: [id number] Date of Birth/Sex: Treating RN: [birth year] (44 y.o. [name] Primary Care Provider: Jan Fireman Other Clinician: Referring Provider: Treating Provider/Extender: Vira Agar, [place] URA [place]  in Treatment: 0 Debridement Performed for Assessment: Wound #2 Left Calcaneus Performed By: Physician Worthy Keeler, PA Debridement Type: Debridement Level of Consciousness (Pre-procedure): Awake and Alert Pre-procedure Verification/Time Out Yes - 10:40 Taken: Start Time: 10:45 Pain Control: Other : benzocaine 20% spray T Area Debrided (L x W): otal 3 (cm) x 2 (cm) = 6 (cm) Tissue and other material debrided: Viable, Non-Viable, Subcutaneous, Skin: Epidermis Level: Skin/Subcutaneous Tissue Debridement Description: Excisional Instrument: Curette Bleeding: Minimum Hemostasis Achieved: Pressure End Time: 10:50 Procedural Pain: 3 Post Procedural Pain: 0 Response to Treatment: Procedure was tolerated well Level of Consciousness (Post- Awake and Alert procedure): Post Debridement Measurements of Total Wound Length: (cm) 11.5 Stage: Category/Stage III Width: (cm) 3 Depth: (cm) 1 Volume: (cm) 27.096 Character of Wound/Ulcer Post Debridement: Improved Post Procedure Diagnosis Same as Pre-procedure Electronic Signature(s) Signed: 07/15/2020 4:01:55 PM By: Worthy Keeler PA-C Signed: 07/15/2020 6:12:31 PM By: Baruch Gouty RN, BSN Entered By: Baruch Gouty on 07/15/2020  10:51:57 -------------------------------------------------------------------------------- HPI Details Patient Name: Date of Service: [name], [name] 07/15/2020 9:00 A M Medical Record Number: [id number] Patient Account Number: [id number] Date of Birth/Sex: Treating RN: July 30, 1976 (44 y.o. [name] Primary Care Provider: Jan Fireman Other Clinician: Referring Provider: Treating Provider/Extender: Vira Agar, [place] URA Weeks in Treatment: 0 History of Present Illness HPI Description: 07/15/2020 upon evaluation today patient presents for initial inspection here in our clinic concerning issues he has been having with the bottoms of his feet bilaterally. He states these actually occurred as wounds when he was hospitalized for 5 months secondary to Covid. He was apparently with tilting bed where he was in an upright position quite frequently and apparently this occurred in some way shape or form during that time. Fortunately there is no sign of active infection at this time. No fevers, chills, nausea, vomiting, or diarrhea. With that being said he still has substantial wounds on the plantar aspects of his feet Theragen require quite a bit of work to get these to heal. He has been using Santyl currently though that is been problematic both in receiving the medication as well as actually paid for it as it is become quite expensive. Prior to the experience with Covid the patient really did not have any major medical problems other than hypertension he does have some mild generalized weakness following the Covid experience. Electronic Signature(s) Signed: 07/15/2020 1:59:50 PM By: Worthy Keeler PA-C Entered By: Worthy Keeler on 07/15/2020 13:59:50 -------------------------------------------------------------------------------- Physical Exam Details Patient Name: Date of Service: [name], [place] 07/15/2020 9:00 A M Medical Record Number: [id number] Patient Account Number:  [id number] Date of Birth/Sex: Treating RN: 05/20/1976 (44 y.o. [name] Primary Care Provider: Jan Fireman Other Clinician: Referring Provider: Treating Provider/Extender: Vira Agar, [place] URA Weeks in Treatment: 0 Constitutional patient is hypertensive.. pulse regular and within target range for patient.Marland Kitchen respirations regular, non-labored and within target range for patient.Marland Kitchen temperature within target range for patient.. Well-nourished and well-hydrated in no acute distress. Eyes conjunctiva clear no eyelid edema noted. pupils equal round and reactive to light and accommodation. Ears, Nose, Mouth, and Throat no gross abnormality of ear auricles or external auditory canals. normal hearing noted during conversation. mucus membranes moist. Respiratory normal breathing without difficulty. Cardiovascular 2+ dorsalis pedis/posterior tibialis pulses. no clubbing, cyanosis, significant edema, <3 sec cap refill. Musculoskeletal slow gait and posture. no significant deformity or arthritic changes, no loss or range of motion, no clubbing.  Psychiatric this patient is able to make decisions and demonstrates good insight into disease process. Alert and Oriented x 3. pleasant and cooperative. Notes Upon inspection today patient's wound bed actually showed signs of good granulation and some areas he did have some slough and biofilm on the surface of the wound throughout some of the region and I did attempt sharp debridement and I was able to debride about half of each of the wounds on the plantar aspect of the feet. After that I had to stop due to discomfort I did not want to cause any issues in this regard. We may need to perform this again in the future but right now he seemed to be at the limit of what he was willing to tolerate which I completely understand and that was okay. Electronic Signature(s) Signed: 07/15/2020 2:00:41 PM By: Worthy Keeler PA-C Entered By: Worthy Keeler  on 07/15/2020 14:00:40 -------------------------------------------------------------------------------- Physician Orders Details Patient Name: Date of Service: [name], [place] 07/15/2020 9:00 A M Medical Record Number: [id number] Patient Account Number: [id number] Date of Birth/Sex: Treating RN: Jul 17, 1976 (44 y.o. [name] Primary Care Provider: Jan Fireman Other Clinician: Referring Provider: Treating Provider/Extender: Vira Agar, [place] URA Weeks in Treatment: 0 Verbal / Phone Orders: No Diagnosis Coding ICD-10 Coding Code Description [phone] Pressure ulcer of other site, stage 3 M62.81 Muscle weakness (generalized) I10 Essential (primary) hypertension Follow-up Appointments Return Appointment in 1 week. Bathing/ Shower/ Hygiene May shower and wash wound with soap and water. - wash wounds with dial antibacterial soap Edema Control - Lymphedema / SCD / Other Bilateral Lower Extremities Elevate legs to the level of the heart or above for 30 minutes daily and/or when sitting, a frequency of: Avoid standing for long periods of time. Off-Loading Other: - keep pressure off of the bottom of your feet Wound Treatment Wound #1 - Calcaneus Wound Laterality: Right Cleanser: Soap and Water 3 x Per Week/30 Days Discharge Instructions: May shower and wash wound with dial antibacterial soap and water prior to dressing change. Prim Dressing: Iodosorb Gel 10 (gm) Tube (DME) (Dispense As Written) 3 x Per Week/30 Days ary Discharge Instructions: Apply to wound bed as instructed Secondary Dressing: Woven Gauze Sponge, Non-Sterile 4x4 in (DME) (Generic) 3 x Per Week/30 Days Discharge Instructions: Apply over primary dressing as directed. Secondary Dressing: ABD Pad, 5x9 (DME) 3 x Per Week/30 Days Discharge Instructions: Apply over primary dressing as directed. Secured With: [employer], 4.5x3.1 (in/yd) (DME) (Generic) 3 x Per Week/30 Days Discharge Instructions:  Secure with Kerlix as directed. Secured With: Paper Tape, 2x10 (in/yd) (DME) (Generic) 3 x Per Week/30 Days Discharge Instructions: Secure dressing with tape as directed. Wound #2 - Calcaneus Wound Laterality: Left Cleanser: Soap and Water 3 x Per Week/30 Days Discharge Instructions: May shower and wash wound with dial antibacterial soap and water prior to dressing change. Prim Dressing: Iodosorb Gel 10 (gm) Tube (DME) (Dispense As Written) 3 x Per Week/30 Days ary Discharge Instructions: Apply to wound bed as instructed Secondary Dressing: Woven Gauze Sponge, Non-Sterile 4x4 in (DME) (Generic) 3 x Per Week/30 Days Discharge Instructions: Apply over primary dressing as directed. Secondary Dressing: ABD Pad, 5x9 (DME) 3 x Per Week/30 Days Discharge Instructions: Apply over primary dressing as directed. Secured With: [employer], 4.5x3.1 (in/yd) (DME) (Generic) 3 x Per Week/30 Days Discharge Instructions: Secure with Kerlix as directed. Secured With: Paper Tape, 2x10 (in/yd) (DME) (Generic) 3 x Per Week/30 Days Discharge  Instructions: Secure dressing with tape as directed. Wound #3 - Foot Wound Laterality: Plantar, Right Cleanser: Soap and Water 3 x Per Week/30 Days Discharge Instructions: May shower and wash wound with dial antibacterial soap and water prior to dressing change. Prim Dressing: Iodosorb Gel 10 (gm) Tube (DME) (Dispense As Written) 3 x Per Week/30 Days ary Discharge Instructions: Apply to wound bed as instructed Secondary Dressing: Woven Gauze Sponge, Non-Sterile 4x4 in (DME) (Generic) 3 x Per Week/30 Days Discharge Instructions: Apply over primary dressing as directed. Secondary Dressing: ABD Pad, 5x9 (DME) 3 x Per Week/30 Days Discharge Instructions: Apply over primary dressing as directed. Secured With: [employer], 4.5x3.1 (in/yd) (DME) (Generic) 3 x Per Week/30 Days Discharge Instructions: Secure with Kerlix as directed. Secured With: Paper Tape, 2x10  (in/yd) (DME) (Generic) 3 x Per Week/30 Days Discharge Instructions: Secure dressing with tape as directed. Patient Medications llergies: No Known Drug Allergies A Notifications Medication Indication Start End prior to debridement 07/15/2020 benzocaine DOSE topical 20 % aerosol - aerosol topical Electronic Signature(s) Signed: 07/15/2020 4:01:55 PM By: Worthy Keeler PA-C Signed: 07/15/2020 6:12:31 PM By: Baruch Gouty RN, BSN Entered By: Baruch Gouty on 07/15/2020 12:48:11 -------------------------------------------------------------------------------- Problem List Details Patient Name: Date of Service: [name], [place] 07/15/2020 9:00 A M Medical Record Number: [id number] Patient Account Number: [id number] Date of Birth/Sex: Treating RN: 01/23/1976 (44 y.o. [name] Primary Care Provider: Jan Fireman Other Clinician: Referring Provider: Treating Provider/Extender: Vira Agar, LA URA Weeks in Treatment: 0 Active Problems ICD-10 Encounter Code Description Active Date MDM Diagnosis L89.893 Pressure ulcer of other site, stage 3 07/15/2020 No Yes M62.81 Muscle weakness (generalized) 07/15/2020 No Yes I10 Essential (primary) hypertension 07/15/2020 No Yes Inactive Problems Resolved Problems Electronic Signature(s) Signed: 07/15/2020 10:38:26 AM By: Worthy Keeler PA-C Entered By: Worthy Keeler on 07/15/2020 10:38:25 -------------------------------------------------------------------------------- Progress Note Details Patient Name: Date of Service: [name], [place] 07/15/2020 9:00 A M Medical Record Number: [id number] Patient Account Number: [id number] Date of Birth/Sex: Treating RN: 1976/08/04 (44 y.o. [name] Primary Care Provider: Jan Fireman Other Clinician: Referring Provider: Treating Provider/Extender: Vira Agar, LA URA Weeks in Treatment: 0 Subjective Chief Complaint Information obtained from Patient Bilateral  Plantar Foot Ulcers History of Present Illness (HPI) 07/15/2020 upon evaluation today patient presents for initial inspection here in our clinic concerning issues he has been having with the bottoms of his feet bilaterally. He states these actually occurred as wounds when he was hospitalized for 5 months secondary to Covid. He was apparently with tilting bed where he was in an upright position quite frequently and apparently this occurred in some way shape or form during that time. Fortunately there is no sign of active infection at this time. No fevers, chills, nausea, vomiting, or diarrhea. With that being said he still has substantial wounds on the plantar aspects of his feet Theragen require quite a bit of work to get these to heal. He has been using Santyl currently though that is been problematic both in receiving the medication as well as actually paid for it as it is become quite expensive. Prior to the experience with Covid the patient really did not have any major medical problems other than hypertension he does have some mild generalized weakness following the Covid experience. Patient History Information obtained from Patient. Allergies No Known Drug Allergies Family History Cancer - Maternal Grandparents, Diabetes - Father,Paternal Grandparents, Heart Disease - Maternal Grandparents, Hypertension -  Father,Paternal Grandparents, Lung Disease - Siblings, No family history of Hereditary Spherocytosis, Kidney Disease, Seizures, Stroke, Thyroid Problems, Tuberculosis. Social History Never smoker, Marital Status - Married, Alcohol Use - Never, Drug Use - No History, Caffeine Use - Daily - tea, soda. Medical History Eyes Denies history of Cataracts, Glaucoma, Optic Neuritis Ear/Nose/Mouth/Throat Denies history of Chronic sinus problems/congestion, Middle ear problems Hematologic/Lymphatic Denies history of Anemia, Hemophilia, Human Immunodeficiency Virus, Lymphedema, Sickle Cell  Disease Respiratory Patient has history of Asthma Denies history of Aspiration, Chronic Obstructive Pulmonary Disease (COPD), Pneumothorax, Sleep Apnea, Tuberculosis Cardiovascular Patient has history of Angina - with COVID, Hypertension Denies history of Arrhythmia, Congestive Heart Failure, Coronary Artery Disease, Deep Vein Thrombosis, Hypotension, Myocardial Infarction, Peripheral Arterial Disease, Peripheral Venous Disease, Phlebitis, Vasculitis Gastrointestinal Denies history of Cirrhosis , Colitis, Crohnoos, Hepatitis A, Hepatitis B, Hepatitis C Endocrine Denies history of Type I Diabetes, Type II Diabetes Genitourinary Denies history of End Stage Renal Disease Immunological Denies history of Lupus Erythematosus, Raynaudoos, Scleroderma Integumentary (Skin) Denies history of History of Burn Musculoskeletal Denies history of Gout, Rheumatoid Arthritis, Osteoarthritis, Osteomyelitis Neurologic Denies history of Dementia, Neuropathy, Quadriplegia, Paraplegia, Seizure Disorder Oncologic Denies history of Received Chemotherapy, Received Radiation Psychiatric Denies history of Anorexia/bulimia, Confinement Anxiety Hospitalization/Surgery History - COVID PNA 07/22/2019- 11/14/2019. - 03/27/2020 wound debridement/ skin graft. Medical A Surgical History Notes nd Constitutional Symptoms (General Health) COVID PNA 07/22/2019-11/14/2019 [facility], foot drop left foot , Genitourinary kidney stone Psychiatric anxiety Review of Systems (ROS) Constitutional Symptoms (General Health) Denies complaints or symptoms of Fatigue, Fever, Chills, Marked Weight Change. Eyes Denies complaints or symptoms of Dry Eyes, Vision Changes, Glasses / Contacts. Ear/Nose/Mouth/Throat Denies complaints or symptoms of Chronic sinus problems or rhinitis. Respiratory Denies complaints or symptoms of Chronic or frequent coughs, Shortness of Breath. Cardiovascular Denies complaints or symptoms of Chest  pain. Gastrointestinal Denies complaints or symptoms of Frequent diarrhea, Nausea, Vomiting. Endocrine Denies complaints or symptoms of Heat/cold intolerance. Genitourinary Denies complaints or symptoms of Frequent urination. Integumentary (Skin) Complains or has symptoms of Wounds - currently bilateral feet. Musculoskeletal Denies complaints or symptoms of Muscle Pain, Muscle Weakness. Neurologic Denies complaints or symptoms of Numbness/parasthesias. Psychiatric Denies complaints or symptoms of Claustrophobia, Suicidal. Objective Constitutional patient is hypertensive.. pulse regular and within target range for patient.Marland Kitchen respirations regular, non-labored and within target range for patient.Marland Kitchen temperature within target range for patient.. Well-nourished and well-hydrated in no acute distress. Vitals Time Taken: 9:38 AM, Height: 69 in, Source: Stated, Weight: 280 lbs, Source: Stated, BMI: 41.3, Temperature: 98.1 F, Pulse: 82 bpm, Respiratory Rate: 20 breaths/min, Blood Pressure: 148/79 mmHg. Eyes conjunctiva clear no eyelid edema noted. pupils equal round and reactive to light and accommodation. Ears, Nose, Mouth, and Throat no gross abnormality of ear auricles or external auditory canals. normal hearing noted during conversation. mucus membranes moist. Respiratory normal breathing without difficulty. Cardiovascular 2+ dorsalis pedis/posterior tibialis pulses. no clubbing, cyanosis, significant edema, Musculoskeletal slow gait and posture. no significant deformity or arthritic changes, no loss or range of motion, no clubbing. Psychiatric this patient is able to make decisions and demonstrates good insight into disease process. Alert and Oriented x 3. pleasant and cooperative. General Notes: Upon inspection today patient's wound bed actually showed signs of good granulation and some areas he did have some slough and biofilm on the surface of the wound throughout some of the region  and I did attempt sharp debridement and I was able to debride about half of each of the wounds on the plantar aspect  of the feet. After that I had to stop due to discomfort I did not want to cause any issues in this regard. We may need to perform this again in the future but right now he seemed to be at the limit of what he was willing to tolerate which I completely understand and that was okay. Integumentary (Hair, Skin) Wound #1 status is Open. Original cause of wound was Pressure Injury. The wound is located on the Right Calcaneus. The wound measures 12.2cm length x 3.3cm width x 1cm depth; 31.62cm^2 area and 31.62cm^3 volume. There is Fat Layer (Subcutaneous Tissue) exposed. There is no tunneling or undermining noted. There is a medium amount of serosanguineous drainage noted. The wound margin is distinct with the outline attached to the wound base. There is small (1-33%) red granulation within the wound bed. There is a large (67-100%) amount of necrotic tissue within the wound bed including Adherent Slough. Wound #2 status is Open. Original cause of wound was Pressure Injury. The wound is located on the Left Calcaneus. The wound measures 11.5cm length x 3cm width x 1cm depth; 27.096cm^2 area and 27.096cm^3 volume. There is Fat Layer (Subcutaneous Tissue) exposed. There is no tunneling or undermining noted. There is a medium amount of serosanguineous drainage noted. The wound margin is distinct with the outline attached to the wound base. There is small (1-33%) red granulation within the wound bed. There is a large (67-100%) amount of necrotic tissue within the wound bed including Adherent Slough. Wound #3 status is Open. Original cause of wound was Pressure Injury. The wound is located on the [place]. The wound measures 0.4cm length x 0.4cm width x 0.1cm depth; 0.126cm^2 area and 0.013cm^3 volume. There is Fat Layer (Subcutaneous Tissue) exposed. There is no tunneling or  undermining noted. There is a medium amount of serosanguineous drainage noted. The wound margin is distinct with the outline attached to the wound base. There is medium (34-66%) pink granulation within the wound bed. There is a medium (34-66%) amount of necrotic tissue within the wound bed including Adherent Slough. Assessment Active Problems ICD-10 Pressure ulcer of other site, stage 3 Muscle weakness (generalized) Essential (primary) hypertension Procedures Wound #1 Pre-procedure diagnosis of Wound #1 is a Pressure Ulcer located on the Right Calcaneus . There was a Excisional Skin/Subcutaneous Tissue Debridement with a total area of 13.2 sq cm performed by Worthy Keeler, PA. With the following instrument(s): Curette to remove Viable and Non-Viable tissue/material. Material removed includes Subcutaneous Tissue and Slough and after achieving pain control using Other (benzocaine 20% spray). No specimens were taken. A time out was conducted at 10:40, prior to the start of the procedure. A Minimum amount of bleeding was controlled with Pressure. The procedure was not tolerated well with a pain level of 3 throughout and a pain level of 0 following the procedure. Response to Treatment Comments: too painful. Post Debridement Measurements: 12.2cm length x 3.3cm width x 1cm depth; 31.62cm^3 volume. Post debridement Stage noted as Category/Stage III. Character of Wound/Ulcer Post Debridement is improved. Post procedure Diagnosis Wound #1: Same as Pre-Procedure Wound #2 Pre-procedure diagnosis of Wound #2 is a Pressure Ulcer located on the Left Calcaneus . There was a Excisional Skin/Subcutaneous Tissue Debridement with a total area of 6 sq cm performed by Worthy Keeler, PA. With the following instrument(s): Curette to remove Viable and Non-Viable tissue/material. Material removed includes Subcutaneous Tissue and Skin: Epidermis and after achieving pain control using Other (benzocaine 20% spray). No  specimens  were taken. A time out was conducted at 10:40, prior to the start of the procedure. A Minimum amount of bleeding was controlled with Pressure. The procedure was tolerated well with a pain level of 3 throughout and a pain level of 0 following the procedure. Post Debridement Measurements: 11.5cm length x 3cm width x 1cm depth; 27.096cm^3 volume. Post debridement Stage noted as Category/Stage III. Character of Wound/Ulcer Post Debridement is improved. Post procedure Diagnosis Wound #2: Same as Pre-Procedure Wound #3 Pre-procedure diagnosis of Wound #3 is a Pressure Ulcer located on the Right,Plantar Foot . There was a Excisional Skin/Subcutaneous Tissue Debridement with a total area of 0.16 sq cm performed by Worthy Keeler, PA. With the following instrument(s): Curette to remove Viable and Non-Viable tissue/material. Material removed includes Subcutaneous Tissue and Skin: Epidermis and after achieving pain control using Other (benzocaine 20% spray). No specimens were taken. A time out was conducted at 10:40, prior to the start of the procedure. A Minimum amount of bleeding was controlled with Pressure. The procedure was tolerated well with a pain level of 3 throughout and a pain level of 0 following the procedure. Post Debridement Measurements: 0.4cm length x 0.4cm width x 0.1cm depth; 0.013cm^3 volume. Post debridement Stage noted as Category/Stage III. Character of Wound/Ulcer Post Debridement is improved. Post procedure Diagnosis Wound #3: Same as Pre-Procedure Plan Follow-up Appointments: Return Appointment in 1 week. Bathing/ Shower/ Hygiene: May shower and wash wound with soap and water. - wash wounds with dial antibacterial soap Edema Control - Lymphedema / SCD / Other: Elevate legs to the level of the heart or above for 30 minutes daily and/or when sitting, a frequency of: Avoid standing for long periods of time. Off-Loading: Other: - keep pressure off of the bottom of your  feet The following medication(s) was prescribed: benzocaine topical 20 % aerosol aerosol topical for prior to debridement was prescribed at facility WOUND #1: - Calcaneus Wound Laterality: Right Cleanser: Soap and Water 3 x Per Week/30 Days Discharge Instructions: May shower and wash wound with dial antibacterial soap and water prior to dressing change. Prim Dressing: Iodosorb Gel 10 (gm) Tube (DME) (Dispense As Written) 3 x Per Week/30 Days ary Discharge Instructions: Apply to wound bed as instructed Secondary Dressing: Woven Gauze Sponge, Non-Sterile 4x4 in (DME) (Generic) 3 x Per Week/30 Days Discharge Instructions: Apply over primary dressing as directed. Secondary Dressing: ABD Pad, 5x9 (DME) 3 x Per Week/30 Days Discharge Instructions: Apply over primary dressing as directed. Secured With: [employer], 4.5x3.1 (in/yd) (DME) (Generic) 3 x Per Week/30 Days Discharge Instructions: Secure with Kerlix as directed. Secured With: Paper T ape, 2x10 (in/yd) (DME) (Generic) 3 x Per Week/30 Days Discharge Instructions: Secure dressing with tape as directed. WOUND #2: - Calcaneus Wound Laterality: Left Cleanser: Soap and Water 3 x Per Week/30 Days Discharge Instructions: May shower and wash wound with dial antibacterial soap and water prior to dressing change. Prim Dressing: Iodosorb Gel 10 (gm) Tube (DME) (Dispense As Written) 3 x Per Week/30 Days ary Discharge Instructions: Apply to wound bed as instructed Secondary Dressing: Woven Gauze Sponge, Non-Sterile 4x4 in (DME) (Generic) 3 x Per Week/30 Days Discharge Instructions: Apply over primary dressing as directed. Secondary Dressing: ABD Pad, 5x9 (DME) 3 x Per Week/30 Days Discharge Instructions: Apply over primary dressing as directed. Secured With: [employer], 4.5x3.1 (in/yd) (DME) (Generic) 3 x Per Week/30 Days Discharge Instructions: Secure with Kerlix as directed. Secured With: Paper T ape, 2x10 (in/yd) (DME) (Generic) 3  x Per Week/30 Days Discharge Instructions: Secure dressing with tape as directed. WOUND #3: - Foot Wound Laterality: Plantar, Right Cleanser: Soap and Water 3 x Per Week/30 Days Discharge Instructions: May shower and wash wound with dial antibacterial soap and water prior to dressing change. Prim Dressing: Iodosorb Gel 10 (gm) Tube (DME) (Dispense As Written) 3 x Per Week/30 Days ary Discharge Instructions: Apply to wound bed as instructed Secondary Dressing: Woven Gauze Sponge, Non-Sterile 4x4 in (DME) (Generic) 3 x Per Week/30 Days Discharge Instructions: Apply over primary dressing as directed. Secondary Dressing: ABD Pad, 5x9 (DME) 3 x Per Week/30 Days Discharge Instructions: Apply over primary dressing as directed. Secured With: [employer], 4.5x3.1 (in/yd) (DME) (Generic) 3 x Per Week/30 Days Discharge Instructions: Secure with Kerlix as directed. Secured With: Paper T ape, 2x10 (in/yd) (DME) (Generic) 3 x Per Week/30 Days Discharge Instructions: Secure dressing with tape as directed. 1. I would recommend currently that we go ahead and initiate treatment at this point with the use of a Iodosorb dressing. I think this may be helpful as far to try to get some of this necrotic tissue cleared away. 2. I am also can recommend at this point that the patient wash with Dial antibacterial soap. 3. I also recommend that he elevate his legs much as possible to try to keep any edema down. 4. Over top of the Iodoflex we use ABD pads followed by roll gauze to secure in place. We will see patient back for reevaluation in 1 week here in the clinic. If anything worsens or changes patient will contact our office for additional recommendations. Electronic Signature(s) Signed: 07/15/2020 2:04:55 PM By: Worthy Keeler PA-C Entered By: Worthy Keeler on 07/15/2020 14:04:54 -------------------------------------------------------------------------------- HxROS Details Patient Name: Date of  Service: [name], [place] 07/15/2020 9:00 A M Medical Record Number: [id number] Patient Account Number: [id number] Date of Birth/Sex: Treating RN: Nov 24, 1975 (44 y.o. [name] Primary Care Provider: Jan Fireman Other Clinician: Referring Provider: Treating Provider/Extender: Vira Agar, [place] URA Weeks in Treatment: 0 Information Obtained From Patient Constitutional Symptoms (General Health) Complaints and Symptoms: Negative for: Fatigue; Fever; Chills; Marked Weight Change Medical History: Past Medical History Notes: COVID PNA 07/22/2019-11/14/2019 [facility] ECMO, foot drop left foot , Eyes Complaints and Symptoms: Negative for: Dry Eyes; Vision Changes; Glasses / Contacts Medical History: Negative for: Cataracts; Glaucoma; Optic Neuritis Ear/Nose/Mouth/Throat Complaints and Symptoms: Negative for: Chronic sinus problems or rhinitis Medical History: Negative for: Chronic sinus problems/congestion; Middle ear problems Respiratory Complaints and Symptoms: Negative for: Chronic or frequent coughs; Shortness of Breath Medical History: Positive for: Asthma Negative for: Aspiration; Chronic Obstructive Pulmonary Disease (COPD); Pneumothorax; Sleep Apnea; Tuberculosis Cardiovascular Complaints and Symptoms: Negative for: Chest pain Medical History: Positive for: Angina - with COVID; Hypertension Negative for: Arrhythmia; Congestive Heart Failure; Coronary Artery Disease; Deep Vein Thrombosis; Hypotension; Myocardial Infarction; Peripheral Arterial Disease; Peripheral Venous Disease; Phlebitis; Vasculitis Gastrointestinal Complaints and Symptoms: Negative for: Frequent diarrhea; Nausea; Vomiting Medical History: Negative for: Cirrhosis ; Colitis; Crohns; Hepatitis A; Hepatitis B; Hepatitis C Endocrine Complaints and Symptoms: Negative for: Heat/cold intolerance Medical History: Negative for: Type I Diabetes; Type II Diabetes Genitourinary Complaints and  Symptoms: Negative for: Frequent urination Medical History: Negative for: End Stage Renal Disease Past Medical History Notes: kidney stone Integumentary (Skin) Complaints and Symptoms: Positive for: Wounds - currently bilateral feet Medical History: Negative for: History of Burn Musculoskeletal Complaints and Symptoms: Negative for: Muscle Pain; Muscle Weakness Medical History: Negative  for: Gout; Rheumatoid Arthritis; Osteoarthritis; Osteomyelitis Neurologic Complaints and Symptoms: Negative for: Numbness/parasthesias Medical History: Negative for: Dementia; Neuropathy; Quadriplegia; Paraplegia; Seizure Disorder Psychiatric Complaints and Symptoms: Negative for: Claustrophobia; Suicidal Medical History: Negative for: Anorexia/bulimia; Confinement Anxiety Past Medical History Notes: anxiety Hematologic/Lymphatic Medical History: Negative for: Anemia; Hemophilia; Human Immunodeficiency Virus; Lymphedema; Sickle Cell Disease Immunological Medical History: Negative for: Lupus Erythematosus; Raynauds; Scleroderma Oncologic Medical History: Negative for: Received Chemotherapy; Received Radiation Immunizations Pneumococcal Vaccine: Received Pneumococcal Vaccination: No Implantable Devices None Hospitalization / Surgery History Type of Hospitalization/Surgery COVID PNA 07/22/2019- 11/14/2019 03/27/2020 wound debridement/ skin graft Family and Social History Cancer: Yes - Maternal Grandparents; Diabetes: Yes - Father,Paternal Grandparents; Heart Disease: Yes - Maternal Grandparents; Hereditary Spherocytosis: No; Hypertension: Yes - Father,Paternal Grandparents; Kidney Disease: No; Lung Disease: Yes - Siblings; Seizures: No; Stroke: No; Thyroid Problems: No; Tuberculosis: No; Never smoker; Marital Status - Married; Alcohol Use: Never; Drug Use: No History; Caffeine Use: Daily - tea, soda; Financial Concerns: No; Food, Clothing or Shelter Needs: No; Support System Lacking: No;  Transportation Concerns: No Electronic Signature(s) Signed: 07/15/2020 4:01:55 PM By: Worthy Keeler PA-C Signed: 07/15/2020 5:17:43 PM By: [name] Entered By: [name] on 07/15/2020 10:06:47 -------------------------------------------------------------------------------- SuperBill Details Patient Name: Date of Service: [name], [place] 07/15/2020 Medical Record Number: [id number] Patient Account Number: [id number] Date of Birth/Sex: Treating RN: [birth year] (44 y.o. [name] Primary Care Provider: Jan Fireman Other Clinician: Referring Provider: Treating Provider/Extender: Vira Agar, [place] URA Weeks in Treatment: 0 Diagnosis Coding ICD-10 Codes Code Description [id number] Pressure ulcer of other site, stage 3 M62.81 Muscle weakness (generalized) I10 Essential (primary) hypertension Facility Procedures CPT4 Code: [id number] Description: 78588 - DEB SUBQ TISSUE 20 SQ CM/< ICD-10 Diagnosis Description L89.893 Pressure ulcer of other site, stage 3 Modifier: Quantity: 1 Physician Procedures : CPT4 Code Description Modifier [id number] WC PHYS LEVEL 3 NEW PT 25 ICD-10 Diagnosis Description L89.893 Pressure ulcer of other site, stage 3 M62.81 Muscle weakness (generalized) I10 Essential (primary) hypertension Quantity: 1 : [id number] 11042 - WC PHYS SUBQ TISS 20 SQ CM ICD-10 Diagnosis Description L89.893 Pressure ulcer of other site, stage 3 Quantity: 1 Electronic Signature(s) Signed: 07/15/2020 2:06:26 PM By: Worthy Keeler PA-C Entered By: Worthy Keeler on 07/15/2020 14:06:26

## 2020-07-15 NOTE — Progress Notes (Signed)
[name], [place] ([id number]) Visit Report for 07/15/2020 Abuse/Suicide Risk Screen Details Patient Name: Date of Service: [name], [place] 07/15/2020 9:00 A M Medical Record Number: [id number] Patient Account Number: [id number] Date of Birth/Sex: Treating RN: Jan 09, 1976 (44 y.o. [name] Primary Care Braylan Faul: Jan Fireman Other Clinician: Referring Jalisia Puchalski: Treating Saw Mendenhall/Extender: Vira Agar, [place] URA Weeks in Treatment: 0 Abuse/Suicide Risk Screen Items Answer ABUSE RISK SCREEN: Has anyone close to you tried to hurt or harm you recentlyo No Do you feel uncomfortable with anyone in your familyo No Has anyone forced you do things that you didnt want to doo No Electronic Signature(s) Signed: 07/15/2020 5:17:43 PM By: [name] Entered By: [name] on 07/15/2020 09:43:01 -------------------------------------------------------------------------------- Activities of Daily Living Details Patient Name: Date of Service: [name], [place]. 07/15/2020 9:00 A M Medical Record Number: [id number] Patient Account Number: [id number] Date of Birth/Sex: Treating RN: 08-04-1976 (44 y.o. [name] Primary Care Nayelie Gionfriddo: Jan Fireman Other Clinician: Referring Mckala Pantaleon: Treating Alberto Schoch/Extender: Vira Agar, [place] URA Weeks in Treatment: 0 Activities of Daily Living Items Answer Activities of Daily Living (Please select one for each item) Drive Automobile Completely Able T Medications ake Completely Able Use T elephone Completely Able Care for Appearance Completely Able Use T oilet Completely Able Bath / Shower Completely Able Dress Self Completely Able Feed Self Completely Able Walk Need Assistance Get In / Out Bed Completely Able Housework Completely Able Prepare Meals Completely [place] for Self Completely Able Electronic Signature(s) Signed: 07/15/2020 5:17:43 PM By: [name] Entered By: [name] on  07/15/2020 10:04:18 -------------------------------------------------------------------------------- Education Screening Details Patient Name: Date of Service: [name], [place]. 07/15/2020 9:00 A M Medical Record Number: [id number] Patient Account Number: [id number] Date of Birth/Sex: Treating RN: Dec 21, 1975 (44 y.o. [name] Primary Care Lizette Pazos: Jan Fireman Other Clinician: Referring Ova Gillentine: Treating Cornie Herrington/Extender: Vira Agar, [place] URA Weeks in Treatment: 0 Primary Learner Assessed: Patient Learning Preferences/Education Level/Primary Language Learning Preference: Explanation, Demonstration, Printed Material Highest Education Level: High School Preferred Language: English Cognitive Barrier Language Barrier: No Translator Needed: No Memory Deficit: No Emotional Barrier: No Cultural/Religious Beliefs Affecting Medical Care: No Physical Barrier Impaired Vision: No Impaired Hearing: No Decreased Hand dexterity: No Knowledge/Comprehension Knowledge Level: High Comprehension Level: High Ability to understand written instructions: High Ability to understand verbal instructions: High Motivation Anxiety Level: Calm Cooperation: Cooperative Education Importance: Acknowledges Need Interest in Health Problems: Asks Questions Perception: Coherent Willingness to Engage in Self-Management High Activities: Readiness to Engage in Self-Management High Activities: Electronic Signature(s) Signed: 07/15/2020 5:17:43 PM By: [name] Entered By: [name] on 07/15/2020 09:44:31 -------------------------------------------------------------------------------- Fall Risk Assessment Details Patient Name: Date of Service: [name], [place]. 07/15/2020 9:00 A M Medical Record Number: [id number] Patient Account Number: [id number] Date of Birth/Sex: Treating RN: 01/04/1976 (44 y.o. [name] Primary Care Natallie Ravenscroft: Jan Fireman Other Clinician: Referring  [name]: Treating Javen Ridings/Extender: Vira Agar, [place] URA Weeks in Treatment: 0 Fall Risk Assessment Items Have you had 2 or more falls in the last 12 monthso 0 No Have you had any fall that resulted in injury in the last 12 monthso 0 No FALLS RISK SCREEN History of falling - immediate or within 3 months 0 No Secondary diagnosis (Do you have 2 or more medical diagnoseso) 0 No Ambulatory aid None/bed rest/wheelchair/nurse 0 Yes Crutches/cane/walker 0 No Furniture 0 No Intravenous therapy Access/Saline/Heparin  Lock 0 No Gait/Transferring Normal/ bed rest/ wheelchair 0 Yes Weak (short steps with or without shuffle, stooped but able to lift head while walking, may seek 0 No support from furniture) Impaired (short steps with shuffle, may have difficulty arising from chair, head down, impaired 0 No balance) Mental Status Oriented to own ability 0 Yes Electronic Signature(s) Signed: 07/15/2020 5:17:43 PM By: [name] Entered By: [name] on 07/15/2020 09:45:05 -------------------------------------------------------------------------------- Foot Assessment Details Patient Name: Date of Service: [name], [place] E. 07/15/2020 9:00 A M Medical Record Number: [id number] Patient Account Number: [id number] Date of Birth/Sex: Treating RN: [birth year] (44 y.o. [name] Primary Care Lynk Marti: Jan Fireman Other Clinician: Referring Adriane Guglielmo: Treating Damyon Mullane/Extender: Vira Agar, [place] URA Weeks in Treatment: 0 Foot Assessment Items Site Locations + = Sensation present, - = Sensation absent, C = Callus, U = Ulcer R = Redness, W = Warmth, M = Maceration, PU = Pre-ulcerative lesion F = Fissure, S = Swelling, D = Dryness Assessment Right: Left: Other Deformity: No No Prior Foot Ulcer: No No Prior Amputation: No No Charcot Joint: No No Ambulatory Status: Ambulatory With Help Assistance Device: Walker Gait: Steady Notes uses wheelchair for doctors  visit. Electronic Signature(s) Signed: 07/15/2020 5:17:43 PM By: [name] Entered By: [name] on 07/15/2020 10:03:31 -------------------------------------------------------------------------------- Nutrition Risk Screening Details Patient Name: Date of Service: [name], [place] 07/15/2020 9:00 A M Medical Record Number: [id number] Patient Account Number: [id number] Date of Birth/Sex: Treating RN: Sep 06, 1975 (44 y.o. [name] Primary Care Karrington Mccravy: Jan Fireman Other Clinician: Referring Murel Shenberger: Treating Alichia Alridge/Extender: Vira Agar, [place] URA Weeks in Treatment: 0 Height (in): 69 Weight (lbs): 280 Body Mass Index (BMI): 41.3 Nutrition Risk Screening Items Score Screening NUTRITION RISK SCREEN: I have an illness or condition that made me change the kind and/or amount of food I eat 2 Yes I eat fewer than two meals per day 0 No I eat few fruits and vegetables, or milk products 0 No I have three or more drinks of beer, liquor or wine almost every day 0 No I have tooth or mouth problems that make it hard for me to eat 0 No I don't always have enough money to buy the food I need 0 No I eat alone most of the time 0 No I take three or more different prescribed or over-the-counter drugs a day 1 Yes Without wanting to, I have lost or gained 10 pounds in the last six months 0 No I am not always physically able to shop, cook and/or feed myself 0 No Nutrition Protocols Good Risk Protocol Moderate Risk Protocol 0 Provide education on nutrition High Risk Proctocol Risk Level: Moderate Risk Score: 3 Electronic Signature(s) Signed: 07/15/2020 5:17:43 PM By: [name] Entered By: [name] on 07/15/2020 09:45:18

## 2020-07-16 NOTE — Progress Notes (Signed)
NIRVAN, LABAN ([id number]) Visit Report for 07/15/2020 Allergy List Details Patient Name: Date of Service: [name], [place] 07/15/2020 9:00 A M Medical Record Number: [id number] Patient Account Number: [id number] Date of Birth/Sex: Treating RN: Jun 15, 1976 (44 y.o. Male) [name] Primary Care Lowana Hable: Jan Fireman Other Clinician: Referring Abdulhadi Stopa: Treating Aylen Rambert/Extender: Vira Agar, [place] URA Weeks in Treatment: 0 Allergies Active Allergies No Known Drug Allergies Allergy Notes Electronic Signature(s) Signed: 07/15/2020 5:17:43 PM By: [name] Entered By: [name] on 07/15/2020 09:42:52 -------------------------------------------------------------------------------- Arrival Information Details Patient Name: Date of Service: [name], [place] E. 07/15/2020 9:00 A M Medical Record Number: [id number] Patient Account Number: [id number] Date of Birth/Sex: Treating RN: 1976-04-20 (44 y.o. Male) [name] Primary Care Darell Saputo: Jan Fireman Other Clinician: Referring Estefanie Cornforth: Treating Jakyia Gaccione/Extender: Vira Agar, [place] URA Weeks in Treatment: 0 Visit Information Patient Arrived: Wheel Chair Arrival Time: 09:41 Accompanied By: wife Transfer Assistance: None Patient Identification Verified: Yes Secondary Verification Process Completed: Yes Patient Requires Transmission-Based Precautions: No Patient Has Alerts: No Electronic Signature(s) Signed: 07/15/2020 5:17:43 PM By: [name] Entered By: [name] on 07/15/2020 09:41:51 -------------------------------------------------------------------------------- Encounter Discharge Information Details Patient Name: Date of Service: [name], [place] E. 07/15/2020 9:00 A M Medical Record Number: [id number] Patient Account Number: [id number] Date of Birth/Sex: Treating RN: Dec 14, 1975 (44 y.o. Male) [name] Primary Care Kayse Puccini: Jan Fireman Other Clinician: Referring  Genie Wenke: Treating Tildon Silveria/Extender: Vira Agar, [place] URA Weeks in Treatment: 0 Encounter Discharge Information Items Post Procedure Vitals Discharge Condition: Stable Temperature (F): 98.1 Ambulatory Status: Ambulatory Pulse (bpm): 82 Discharge Destination: Home Respiratory Rate (breaths/min): 20 Transportation: Private Auto Blood Pressure (mmHg): 148/79 Accompanied By: family Schedule Follow-up Appointment: Yes Clinical Summary of Care: Patient Declined Electronic Signature(s) Signed: 07/16/2020 3:27:39 PM By: [name] RN Entered By: [name] on 07/15/2020 11:05:58 -------------------------------------------------------------------------------- Lower Extremity Assessment Details Patient Name: Date of Service: [name], [place] E. 07/15/2020 9:00 A M Medical Record Number: [id number] Patient Account Number: [id number] Date of Birth/Sex: Treating RN: 1976/07/02 (44 y.o. Male) [name] Primary Care Marit Goodwill: Jan Fireman Other Clinician: Referring Elianie Hubers: Treating Supriya Beaston/Extender: Vira Agar, [place] URA Weeks in Treatment: 0 Edema Assessment Assessed: [Left: Yes] [Right: Yes] Edema: [Left: Yes] [Right: Yes] Calf Left: Right: Point of Measurement: 34 cm From Medial Instep 41.2 cm 40.5 cm Ankle Left: Right: Point of Measurement: 12 cm From Medial Instep 25 cm 24.5 cm Vascular Assessment Pulses: Dorsalis Pedis Palpable: [Left:No] [Right:Yes] Doppler Audible: [Left:Yes] [Right:Yes] Posterior Tibial Palpable: [Left:Yes] [Right:Yes] Blood Pressure: Brachial: [Left:148] [Right:148] Ankle: [Left:Dorsalis Pedis: 152 1.03] [Right:Dorsalis Pedis: 160 1.08] Electronic Signature(s) Signed: 07/15/2020 5:17:43 PM By: [name] Entered By: [name] on 07/15/2020 10:07:19 -------------------------------------------------------------------------------- Multi-Disciplinary Care Plan Details Patient Name: Date of Service: [name],  [place] E. 07/15/2020 9:00 A M Medical Record Number: [id number] Patient Account Number: [id number] Date of Birth/Sex: Treating RN: [birth year] (44 y.o. Male) Baruch Gouty Primary Care Ruthanna Macchia: Jan Fireman Other Clinician: Referring Urias Sheek: Treating Arcangel Minion/Extender: Vira Agar, [place] URA Weeks in Treatment: 0 Active Inactive Abuse / Safety / Falls / Self Care Management Nursing Diagnoses: Potential for falls Goals: Patient/caregiver will verbalize/demonstrate measures taken to prevent injury and/or falls Date Initiated: 07/15/2020 Target Resolution Date: 08/12/2020 Goal Status: Active Interventions: Assess fall risk on admission and as needed Assess impairment of mobility on admission and as needed per policy Notes: Pressure Nursing Diagnoses: Knowledge deficit related to management of  pressures ulcers Potential for impaired tissue integrity related to pressure, friction, moisture, and shear Goals: Patient/caregiver will verbalize understanding of pressure ulcer management Date Initiated: 07/15/2020 Target Resolution Date: 08/12/2020 Goal Status: Active Interventions: Assess: immobility, friction, shearing, incontinence upon admission and as needed Assess potential for pressure ulcer upon admission and as needed Notes: Wound/Skin Impairment Nursing Diagnoses: Impaired tissue integrity Knowledge deficit related to ulceration/compromised skin integrity Goals: Patient/caregiver will verbalize understanding of skin care regimen Date Initiated: 07/15/2020 Target Resolution Date: 08/12/2020 Goal Status: Active Ulcer/skin breakdown will have a volume reduction of 30% by week 4 Date Initiated: 07/15/2020 Target Resolution Date: 08/12/2020 Goal Status: Active Interventions: Assess patient/caregiver ability to obtain necessary supplies Assess patient/caregiver ability to perform ulcer/skin care regimen upon admission and as needed Assess ulceration(s) every visit Provide  education on ulcer and skin care Treatment Activities: Skin care regimen initiated : 07/15/2020 Topical wound management initiated : 07/15/2020 Notes: Electronic Signature(s) Signed: 07/15/2020 6:12:31 PM By: Baruch Gouty RN, BSN Entered By: Baruch Gouty on 07/15/2020 10:41:58 -------------------------------------------------------------------------------- Pain Assessment Details Patient Name: Date of Service: [name], [place] E. 07/15/2020 9:00 A M Medical Record Number: [id number] Patient Account Number: [id number] Date of Birth/Sex: Treating RN: May 28, 1976 (44 y.o. Male) [name] Primary Care Teja Costen: Jan Fireman Other Clinician: Referring Klair Leising: Treating Jaiah Weigel/Extender: Vira Agar, [place] URA Weeks in Treatment: 0 Active Problems Location of Pain Severity and Description of Pain Patient Has Paino Yes Site Locations Pain Location: Pain in Ulcers Rate the pain. Current Pain Level: 6 Worst Pain Level: 10 Least Pain Level: 0 Tolerable Pain Level: 8 Pain Management and Medication Current Pain Management: Medication: No Cold Application: No Rest: No Massage: No Activity: No T.E.N.S.: No Heat Application: No Leg drop or elevation: No Is the Current Pain Management Adequate: Adequate How does your wound impact your activities of daily livingo Sleep: No Bathing: No Appetite: No Relationship With Others: No Bladder Continence: No Emotions: No Bowel Continence: No Work: No Toileting: No Drive: No Dressing: No Hobbies: No Electronic Signature(s) Signed: 07/15/2020 5:17:43 PM By: [name] Entered By: [name] on 07/15/2020 09:46:05 -------------------------------------------------------------------------------- Patient/Caregiver Education Details Patient Name: Date of Service: Ciro Backer 12/8/2021andnbsp9:00 A M Medical Record Number: [id number] Patient Account Number: [id number] Date of Birth/Gender: Treating RN: 06-14-1976  (44 y.o. Male) Baruch Gouty Primary Care Physician: Jan Fireman Other Clinician: Referring Physician: Treating Physician/Extender: Vira Agar, [place] URA Weeks in Treatment: 0 Education Assessment Education Provided To: Patient Education Topics Provided Welcome T [facility]: o Handouts: Welcome T [facility] [place] o Methods: Explain/Verbal, Printed Responses: Reinforcements needed, State content correctly Wound Debridement: Handouts: Wound Debridement Methods: Explain/Verbal, Printed Responses: Reinforcements needed, State content correctly Wound/Skin Impairment: Handouts: Caring for Your Ulcer, Skin Care Do's and Dont's Methods: Explain/Verbal, Printed Responses: Reinforcements needed, State content correctly Electronic Signature(s) Signed: 07/15/2020 6:12:31 PM By: Baruch Gouty RN, BSN Entered By: Baruch Gouty on 07/15/2020 10:43:39 -------------------------------------------------------------------------------- Wound Assessment Details Patient Name: Date of Service: [name], [place] E. 07/15/2020 9:00 A M Medical Record Number: [id number] Patient Account Number: [id number] Date of Birth/Sex: Treating RN: August 27, 1975 (44 y.o. Male) [name] Primary Care Vincent Streater: Jan Fireman Other Clinician: Referring Nakyah Erdmann: Treating Darris Staiger/Extender: Vira Agar, [place] URA Weeks in Treatment: 0 Wound Status Wound Number: 1 Primary Etiology: Pressure Ulcer Wound Location: Right Calcaneus Wound Status: Open Wounding Event: Pressure Injury Comorbid History: Asthma, Angina, Hypertension Date Acquired: 10/07/2019 Weeks Of Treatment: 0  Clustered Wound: No Photos Photo Uploaded By: Mikeal Hawthorne on 07/16/2020 13:37:46 Wound Measurements Length: (cm) 12.2 Width: (cm) 3.3 Depth: (cm) 1 Area: (cm) 31.62 Volume: (cm) 31.62 % Reduction in Area: % Reduction in Volume: Epithelialization: Small (1-33%) Tunneling: No Undermining: No Wound  Description Classification: Category/Stage III Wound Margin: Distinct, outline attached Exudate Amount: Medium Exudate Type: Serosanguineous Exudate Color: red, brown Foul Odor After Cleansing: No Slough/Fibrino Yes Wound Bed Granulation Amount: Small (1-33%) Exposed Structure Granulation Quality: Red Fascia Exposed: No Necrotic Amount: Large (67-100%) Fat Layer (Subcutaneous Tissue) Exposed: Yes Necrotic Quality: Adherent Slough Tendon Exposed: No Muscle Exposed: No Joint Exposed: No Bone Exposed: No Treatment Notes Wound #1 (Calcaneus) Wound Laterality: Right Cleanser Soap and Water Discharge Instruction: May shower and wash wound with dial antibacterial soap and water prior to dressing change. Peri-Wound Care Topical Primary Dressing Iodosorb Gel 10 (gm) Tube Discharge Instruction: Apply to wound bed as instructed Secondary Dressing Woven Gauze Sponge, Non-Sterile 4x4 in Discharge Instruction: Apply over primary dressing as directed. ABD Pad, 5x9 Discharge Instruction: Apply over primary dressing as directed. Secured With [employer], 4.5x3.1 (in/yd) Discharge Instruction: Secure with Kerlix as directed. Paper Tape, 2x10 (in/yd) Discharge Instruction: Secure dressing with tape as directed. Compression Wrap Compression Stockings Add-Ons Electronic Signature(s) Signed: 07/15/2020 5:17:43 PM By: [name] Entered By: [name] on 07/15/2020 09:59:07 -------------------------------------------------------------------------------- Wound Assessment Details Patient Name: Date of Service: [name], [place] E. 07/15/2020 9:00 A M Medical Record Number: [id number] Patient Account Number: [id number] Date of Birth/Sex: Treating RN: July 17, 1976 (44 y.o. Male) [name] Primary Care Ernestine Rohman: Jan Fireman Other Clinician: Referring Donny Heffern: Treating Diamantina Edinger/Extender: Vira Agar, [place] URA Weeks in Treatment: 0 Wound Status Wound Number:  2 Primary Etiology: Pressure Ulcer Wound Location: Left Calcaneus Wound Status: Open Wounding Event: Pressure Injury Comorbid History: Asthma, Angina, Hypertension Date Acquired: 10/07/2019 Weeks Of Treatment: 0 Clustered Wound: No Photos Photo Uploaded By: Mikeal Hawthorne on 07/16/2020 13:37:32 Wound Measurements Length: (cm) 11.5 Width: (cm) 3 Depth: (cm) 1 Area: (cm) 27.096 Volume: (cm) 27.096 % Reduction in Area: % Reduction in Volume: Epithelialization: None Tunneling: No Undermining: No Wound Description Classification: Category/Stage III Wound Margin: Distinct, outline attached Exudate Amount: Medium Exudate Type: Serosanguineous Exudate Color: red, brown Foul Odor After Cleansing: No Slough/Fibrino Yes Wound Bed Granulation Amount: Small (1-33%) Exposed Structure Granulation Quality: Red Fascia Exposed: No Necrotic Amount: Large (67-100%) Fat Layer (Subcutaneous Tissue) Exposed: Yes Necrotic Quality: Adherent Slough Tendon Exposed: No Muscle Exposed: No Joint Exposed: No Bone Exposed: No Treatment Notes Wound #2 (Calcaneus) Wound Laterality: Left Cleanser Soap and Water Discharge Instruction: May shower and wash wound with dial antibacterial soap and water prior to dressing change. Peri-Wound Care Topical Primary Dressing Iodosorb Gel 10 (gm) Tube Discharge Instruction: Apply to wound bed as instructed Secondary Dressing Woven Gauze Sponge, Non-Sterile 4x4 in Discharge Instruction: Apply over primary dressing as directed. ABD Pad, 5x9 Discharge Instruction: Apply over primary dressing as directed. Secured With [employer], 4.5x3.1 (in/yd) Discharge Instruction: Secure with Kerlix as directed. Paper Tape, 2x10 (in/yd) Discharge Instruction: Secure dressing with tape as directed. Compression Wrap Compression Stockings Add-Ons Electronic Signature(s) Signed: 07/15/2020 5:17:43 PM By: [name] Entered By: [name] on 07/15/2020  10:01:33 -------------------------------------------------------------------------------- Wound Assessment Details Patient Name: Date of Service: [name], [place]. 07/15/2020 9:00 A M Medical Record Number: [id number] Patient Account Number: [id number] Date of Birth/Sex: Treating RN: 10/24/1975 (44 y.o. Male) [name] Primary Care Zakkary Thibault: Nolon Bussing URA  Other Clinician: Referring Yer Castello: Treating Tashyra Adduci/Extender: Vira Agar, [place] URA Weeks in Treatment: 0 Wound Status Wound Number: 3 Primary Etiology: Pressure Ulcer Wound Location: Right, Plantar Foot Wound Status: Open Wounding Event: Pressure Injury Comorbid History: Asthma, Angina, Hypertension Date Acquired: 10/07/2019 Weeks Of Treatment: 0 Clustered Wound: No Photos Photo Uploaded By: Mikeal Hawthorne on 07/16/2020 13:37:32 Wound Measurements Length: (cm) 0.4 Width: (cm) 0.4 Depth: (cm) 0.1 Area: (cm) 0.126 Volume: (cm) 0.013 % Reduction in Area: 0% % Reduction in Volume: 0% Epithelialization: Small (1-33%) Tunneling: No Undermining: No Wound Description Classification: Category/Stage III Wound Margin: Distinct, outline attached Exudate Amount: Medium Exudate Type: Serosanguineous Exudate Color: red, brown Foul Odor After Cleansing: No Slough/Fibrino Yes Wound Bed Granulation Amount: Medium (34-66%) Exposed Structure Granulation Quality: Pink Fascia Exposed: No Necrotic Amount: Medium (34-66%) Fat Layer (Subcutaneous Tissue) Exposed: Yes Necrotic Quality: Adherent Slough Tendon Exposed: No Muscle Exposed: No Joint Exposed: No Bone Exposed: No Treatment Notes Wound #3 (Foot) Wound Laterality: Plantar, Right Cleanser Soap and Water Discharge Instruction: May shower and wash wound with dial antibacterial soap and water prior to dressing change. Peri-Wound Care Topical Primary Dressing Iodosorb Gel 10 (gm) Tube Discharge Instruction: Apply to wound bed as instructed Secondary  Dressing Woven Gauze Sponge, Non-Sterile 4x4 in Discharge Instruction: Apply over primary dressing as directed. ABD Pad, 5x9 Discharge Instruction: Apply over primary dressing as directed. Secured With [employer], 4.5x3.1 (in/yd) Discharge Instruction: Secure with Kerlix as directed. Paper Tape, 2x10 (in/yd) Discharge Instruction: Secure dressing with tape as directed. Compression Wrap Compression Stockings Add-Ons Electronic Signature(s) Signed: 07/15/2020 5:17:43 PM By: [name] Entered By: [name] on 07/15/2020 10:02:45 -------------------------------------------------------------------------------- Vitals Details Patient Name: Date of Service: [name], [place] 07/15/2020 9:00 A M Medical Record Number: [id number] Patient Account Number: [id number] Date of Birth/Sex: Treating RN: 01-03-1976 (44 y.o. Male) Rolin Barry, [place] Primary Care Teo Moede: Jan Fireman Other Clinician: Referring Arzell Mcgeehan: Treating Markita Stcharles/Extender: Vira Agar, [place] URA Weeks in Treatment: 0 Vital Signs Time Taken: 09:38 Temperature (F): 98.1 Height (in): 69 Pulse (bpm): 82 Source: Stated Respiratory Rate (breaths/min): 20 Source: Stated Respiratory Rate (breaths/min): 20 Weight (lbs): 280 Blood Pressure (mmHg): 148/79 Source: Stated Reference Range: 80 - 120 mg / dl Body Mass Index (BMI): 41.3 Electronic Signature(s) Signed: 07/15/2020 5:17:43 PM By: [name] Entered By: [name] on 07/15/2020 09:42:36

## 2020-07-17 ENCOUNTER — Other Ambulatory Visit: Payer: Self-pay | Admitting: Podiatry

## 2020-07-17 ENCOUNTER — Telehealth: Payer: Self-pay | Admitting: Podiatry

## 2020-07-17 MED ORDER — OXYCODONE-ACETAMINOPHEN 5-325 MG PO TABS: 1.0000 | ORAL_TABLET | Freq: Three times a day (TID) | ORAL | 0 refills | Status: DC | PRN

## 2020-07-17 NOTE — Telephone Encounter (Signed)
Needs pain meds refilled ( oxyCODONE-acetaminophen )

## 2020-07-17 NOTE — Progress Notes (Signed)
PRN foot pain b/l

## 2020-07-17 NOTE — Telephone Encounter (Signed)
Rx sent 

## 2020-07-20 ENCOUNTER — Telehealth: Payer: Self-pay | Admitting: Allergy & Immunology

## 2020-07-20 MED ORDER — BUDESONIDE-FORMOTEROL FUMARATE 80-4.5 MCG/ACT IN AERO: 2.0000 | INHALATION_SPRAY | Freq: Two times a day (BID) | RESPIRATORY_TRACT | 4 refills | Status: DC

## 2020-07-20 NOTE — Telephone Encounter (Signed)
Sent in the 90 day supply per patients request and notified him that the medication has been sent in. I informed patient that if he had any issues or concerns to call our office back. Patient verbally agreed.

## 2020-07-20 NOTE — Telephone Encounter (Signed)
Patient is requesting a 90 supply for Symbicort. [facility] in [place], [place]. Last seen 06/04/20.

## 2020-07-22 ENCOUNTER — Other Ambulatory Visit: Payer: Self-pay

## 2020-07-22 ENCOUNTER — Telehealth: Payer: Self-pay | Admitting: *Deleted

## 2020-07-22 ENCOUNTER — Other Ambulatory Visit (HOSPITAL_COMMUNITY)
Admission: RE | Admit: 2020-07-22 | Discharge: 2020-07-22 | Disposition: A | Discharge status: Home / Self Care | Payer: Managed Care, Other (non HMO) | Source: Other Acute Inpatient Hospital | Attending: Physician Assistant | Admitting: Physician Assistant

## 2020-07-22 ENCOUNTER — Encounter (HOSPITAL_BASED_OUTPATIENT_CLINIC_OR_DEPARTMENT_OTHER): Payer: Managed Care, Other (non HMO) | Admitting: Physician Assistant

## 2020-07-22 DIAGNOSIS — L89893 Pressure ulcer of other site, stage 3: Secondary | ICD-10-CM | POA: Diagnosis not present

## 2020-07-22 NOTE — Telephone Encounter (Signed)
[name] called because he has been put on a new antibiotic and there were some concerns with the prescribing doctor that it may interfere with a few of his medications (he mentioned trazodone cymbalta and celxa).  He does not know the name of the antibiotic so I have suggested he find out and discuss with the pharmacist, and then call us back with the information for Dr Dagoberto Ligas.

## 2020-07-22 NOTE — Progress Notes (Addendum)
PLEASANT, BENSINGER ([id number]) Visit Report for 07/22/2020 Chief Complaint Document Details Patient Name: Date of Service: [name] 07/22/2020 8:45 A M Medical Record Number: [id number] Patient Account Number: [id number] Date of Birth/Sex: Treating RN: June 01, 1976 (44 y.o. [name] Primary Care Provider: Jan Fireman Other Clinician: Referring Provider: Treating Provider/Extender: Vira Agar, [place] URA Weeks in Treatment: 1 Information Obtained from: Patient Chief Complaint Bilateral Plantar Foot Ulcers Electronic Signature(s) Signed: 07/22/2020 9:00:50 AM By: Worthy Keeler PA-C Entered By: Worthy Keeler on 07/22/2020 09:00:49 -------------------------------------------------------------------------------- HPI Details Patient Name: Date of Service: [name], [name] 07/22/2020 8:45 A M Medical Record Number: [id number] Patient Account Number: [id number] Date of Birth/Sex: Treating RN: June 19, 1976 (44 y.o. [name] Primary Care Provider: Jan Fireman Other Clinician: Referring Provider: Treating Provider/Extender: Vira Agar, [place] URA Weeks in Treatment: 1 History of Present Illness HPI Description: 07/15/2020 upon evaluation today patient presents for initial inspection here in our clinic concerning issues he has been having with the bottoms of his feet bilaterally. He states these actually occurred as wounds when he was hospitalized for 5 months secondary [name] Covid. He was apparently with tilting bed where he was in an upright position quite frequently and apparently this occurred in some way shape or form during that time. Fortunately there is no sign of active infection at this time. No fevers, chills, nausea, vomiting, or diarrhea. With that being said he still has substantial wounds on the plantar aspects of his feet Theragen require quite a bit of work [name] get these [name] heal. He has been using Santyl currently though that is been  problematic both in receiving the medication as well as actually paid for it as it is become quite expensive. Prior [name] the experience with Covid the patient really did not have any major medical problems other than hypertension he does have some mild generalized weakness following the Covid experience. 07/22/2020 on evaluation today patient appears [name] be doing okay in regard [name] his foot ulcers I feel like the wound beds are showing signs of better improvement that I do believe the Iodoflex is helping in this regard. With that being said he does have a lot of drainage currently and this is somewhat blue/green in nature which is consistent with Pseudomonas. I do think a culture today would be appropriate for [place] [name] evaluate and see if that is indeed the case I would likely start him on antibiotic orally as well he is not allergic [name] Cipro knows of no issues he has had in the past Electronic Signature(s) Signed: 07/22/2020 9:41:20 AM By: Worthy Keeler PA-C Entered By: Worthy Keeler on 07/22/2020 09:41:20 -------------------------------------------------------------------------------- Physical Exam Details Patient Name: Date of Service: [name], [name] E. 07/22/2020 8:45 A M Medical Record Number: [id number] Patient Account Number: [id number] Date of Birth/Sex: Treating RN: 10-08-1975 (44 y.o. [name] Primary Care Provider: Jan Fireman Other Clinician: Referring Provider: Treating Provider/Extender: Vira Agar, [place] URA Weeks in Treatment: 1 Constitutional Well-nourished and well-hydrated in no acute distress. Respiratory normal breathing without difficulty. Psychiatric this patient is able [name] make decisions and demonstrates good insight into disease process. Alert and Oriented x 3. pleasant and cooperative. Notes Patient's wound bed currently showed signs of good granulation at this time. He had minimal necrotic tissue noted [name] be honest he had a lot of  drainage however and based on the color of the drainage which  was blue/green in color I am concerned about Pseudomonas Electronic Signature(s) Signed: 07/22/2020 9:43:33 AM By: Worthy Keeler PA-C Entered By: Worthy Keeler on 07/22/2020 09:43:32 -------------------------------------------------------------------------------- Physician Orders Details Patient Name: Date of Service: [name], [name] 07/22/2020 8:45 A M Medical Record Number: [id number] Patient Account Number: [id number] Date of Birth/Sex: Treating RN: 03/29/1976 (44 y.o. [name] Primary Care Provider: Jan Fireman Other Clinician: Referring Provider: Treating Provider/Extender: Vira Agar, [place] URA Weeks in Treatment: 1 Verbal / Phone Orders: No Diagnosis Coding ICD-10 Coding Code Description [phone] Pressure ulcer of other site, stage 3 M62.81 Muscle weakness (generalized) I10 Essential (primary) hypertension Follow-up Appointments Return Appointment in 1 week. Bathing/ Shower/ Hygiene May shower and wash wound with soap and water. - wash wounds with dial antibacterial soap Edema Control - Lymphedema / SCD / Other Bilateral Lower Extremities Elevate legs [name] the level of the heart or above for 30 minutes daily and/or when sitting, a frequency of: Avoid standing for long periods of time. Off-Loading Other: - keep pressure off of the bottom of your feet Wound Treatment Wound #1 - Calcaneus Wound Laterality: Right Cleanser: Soap and Water 3 x Per Week/30 Days Discharge Instructions: May shower and wash wound with dial antibacterial soap and water prior [name] dressing change. Topical: Gentamicin 3 x Per Week/30 Days Discharge Instructions: Apply thin layer [name] wound bed under iodosorb with dressing changes Prim Dressing: Iodosorb Gel 10 (gm) Tube (Dispense As Written) 3 x Per Week/30 Days ary Discharge Instructions: Apply [name] wound bed as instructed Secondary Dressing: Woven Gauze Sponge,  Non-Sterile 4x4 in (Generic) 3 x Per Week/30 Days Discharge Instructions: Apply over primary dressing as directed. Secondary Dressing: ABD Pad, 5x9 3 x Per Week/30 Days Discharge Instructions: Apply over primary dressing as directed. Secured With: [employer], 4.5x3.1 (in/yd) (Generic) 3 x Per Week/30 Days Discharge Instructions: Secure with Kerlix as directed. Secured With: Paper Tape, 2x10 (in/yd) (Generic) 3 x Per Week/30 Days Discharge Instructions: Secure dressing with tape as directed. Wound #2 - Calcaneus Wound Laterality: Left Cleanser: Soap and Water 3 x Per Week/30 Days Discharge Instructions: May shower and wash wound with dial antibacterial soap and water prior [name] dressing change. Topical: Gentamicin 3 x Per Week/30 Days Discharge Instructions: Apply thin layer [name] wound bed under iodosorb with dressing changes Prim Dressing: Iodosorb Gel 10 (gm) Tube (Dispense As Written) 3 x Per Week/30 Days ary Discharge Instructions: Apply [name] wound bed as instructed Secondary Dressing: Woven Gauze Sponge, Non-Sterile 4x4 in (Generic) 3 x Per Week/30 Days Discharge Instructions: Apply over primary dressing as directed. Secondary Dressing: ABD Pad, 5x9 3 x Per Week/30 Days Discharge Instructions: Apply over primary dressing as directed. Secured With: [employer], 4.5x3.1 (in/yd) (Generic) 3 x Per Week/30 Days Discharge Instructions: Secure with Kerlix as directed. Secured With: Paper Tape, 2x10 (in/yd) (Generic) 3 x Per Week/30 Days Discharge Instructions: Secure dressing with tape as directed. Laboratory naerobe culture (MICRO) - right foot Bacteria identified in Unspecified specimen by A LOINC Code: 914-7 Convenience Name: Anerobic culture Patient Medications llergies: No Known Drug Allergies A Notifications Medication Indication Start End 07/22/2020 gentamicin DOSE topical 0.1 % ointment - ointment topical applied in a thin film [name] the wound bed then cover with wound  care dressing as recommended every other days Electronic Signature(s) Signed: 07/22/2020 9:52:18 AM By: Worthy Keeler PA-C Entered By: Worthy Keeler on 07/22/2020 09:52:16 -------------------------------------------------------------------------------- Problem List Details Patient Name: Date of Service:  [name], [name] [place] E. 07/22/2020 8:45 A M Medical Record Number: [id number] Patient Account Number: [id number] Date of Birth/Sex: Treating RN: 12-Mar-1976 (44 y.o. [name] Primary Care Provider: Jan Fireman Other Clinician: Referring Provider: Treating Provider/Extender: Vira Agar, [place] URA Weeks in Treatment: 1 Active Problems ICD-10 Encounter Code Description Active Date MDM Diagnosis L89.893 Pressure ulcer of other site, stage 3 07/15/2020 No Yes M62.81 Muscle weakness (generalized) 07/15/2020 No Yes I10 Essential (primary) hypertension 07/15/2020 No Yes Inactive Problems Resolved Problems Electronic Signature(s) Signed: 07/22/2020 9:00:41 AM By: Worthy Keeler PA-C Entered By: Worthy Keeler on 07/22/2020 09:00:41 -------------------------------------------------------------------------------- Progress Note Details Patient Name: Date of Service: [name], [name] 07/22/2020 8:45 A M Medical Record Number: [id number] Patient Account Number: [id number] Date of Birth/Sex: Treating RN: [birth year] (44 y.o. [name] Primary Care Provider: Jan Fireman Other Clinician: Referring Provider: Treating Provider/Extender: Vira Agar, [place] URA Weeks in Treatment: 1 Subjective Chief Complaint Information obtained from Patient Bilateral Plantar Foot Ulcers History of Present Illness (HPI) 07/15/2020 upon evaluation today patient presents for initial inspection here in our clinic concerning issues he has been having with the bottoms of his feet bilaterally. He states these actually occurred as wounds when he was hospitalized for 5 months  secondary [name] Covid. He was apparently with tilting bed where he was in an upright position quite frequently and apparently this occurred in some way shape or form during that time. Fortunately there is no sign of active infection at this time. No fevers, chills, nausea, vomiting, or diarrhea. With that being said he still has substantial wounds on the plantar aspects of his feet Theragen require quite a bit of work [name] get these [name] heal. He has been using Santyl currently though that is been problematic both in receiving the medication as well as actually paid for it as it is become quite expensive. Prior [name] the experience with Covid the patient really did not have any major medical problems other than hypertension he does have some mild generalized weakness following the Covid experience. 07/22/2020 on evaluation today patient appears [name] be doing okay in regard [name] his foot ulcers I feel like the wound beds are showing signs of better improvement that I do believe the Iodoflex is helping in this regard. With that being said he does have a lot of drainage currently and this is somewhat blue/green in nature which is consistent with Pseudomonas. I do think a culture today would be appropriate for [place] [name] evaluate and see if that is indeed the case I would likely start him on antibiotic orally as well he is not allergic [name] Cipro knows of no issues he has had in the past Objective Constitutional Well-nourished and well-hydrated in no acute distress. Vitals Time Taken: 9:08 AM, Height: 69 in, Weight: 280 lbs, BMI: 41.3, Temperature: 97.7 F, Pulse: 80 bpm, Respiratory Rate: 18 breaths/min, Blood Pressure: 150/70 mmHg. Respiratory normal breathing without difficulty. Psychiatric this patient is able [name] make decisions and demonstrates good insight into disease process. Alert and Oriented x 3. pleasant and cooperative. General Notes: Patient's wound bed currently showed signs of good granulation at this  time. He had minimal necrotic tissue noted [name] be honest he had a lot of drainage however and based on the color of the drainage which was blue/green in color I am concerned about Pseudomonas Integumentary (Hair, Skin) Wound #1 status is Open. Original cause of wound was Pressure Injury. The wound  is located on the Right Calcaneus. The wound measures 8.4cm length x 3.5cm width x 0.3cm depth; 23.091cm^2 area and 6.927cm^3 volume. There is Fat Layer (Subcutaneous Tissue) exposed. There is no tunneling noted, however, there is undermining starting at 1:00 and ending at 3:00 with a maximum distance of 0.3cm. There is a medium amount of serosanguineous drainage noted. The wound margin is distinct with the outline attached [name] the wound base. There is small (1-33%) red granulation within the wound bed. There is a large (67-100%) amount of necrotic tissue within the wound bed including Adherent Slough. Wound #2 status is Open. Original cause of wound was Pressure Injury. The wound is located on the Left Calcaneus. The wound measures 8.3cm length x 3.3cm width x 0.3cm depth; 21.512cm^2 area and 6.454cm^3 volume. There is Fat Layer (Subcutaneous Tissue) exposed. There is no tunneling noted, however, there is undermining starting at 11:00 and ending at 3:00 with a maximum distance of 0.3cm. There is a medium amount of serosanguineous drainage noted. The wound margin is distinct with the outline attached [name] the wound base. There is small (1-33%) red granulation within the wound bed. There is a large (67-100%) amount of necrotic tissue within the wound bed including Adherent Slough. Wound #3 status is Open. Original cause of wound was Pressure Injury. The wound is located on the [place]. The wound measures 0cm length x 0cm width x 0cm depth; 0cm^2 area and 0cm^3 volume. There is no tunneling or undermining noted. There is a none present amount of drainage noted. The wound margin is distinct with the  outline attached [name] the wound base. There is no granulation within the wound bed. There is no necrotic tissue within the wound bed. Assessment Active Problems ICD-10 Pressure ulcer of other site, stage 3 Muscle weakness (generalized) Essential (primary) hypertension Plan Follow-up Appointments: Return Appointment in 1 week. Bathing/ Shower/ Hygiene: May shower and wash wound with soap and water. - wash wounds with dial antibacterial soap Edema Control - Lymphedema / SCD / Other: Elevate legs [name] the level of the heart or above for 30 minutes daily and/or when sitting, a frequency of: Avoid standing for long periods of time. Off-Loading: Other: - keep pressure off of the bottom of your feet Laboratory ordered were: Anerobic culture - right foot The following medication(s) was prescribed: gentamicin topical 0.1 % ointment ointment topical applied in a thin film [name] the wound bed then cover with wound care dressing as recommended every other days starting 07/22/2020 WOUND #1: - Calcaneus Wound Laterality: Right Cleanser: Soap and Water 3 x Per Week/30 Days Discharge Instructions: May shower and wash wound with dial antibacterial soap and water prior [name] dressing change. Topical: Gentamicin 3 x Per Week/30 Days Discharge Instructions: Apply thin layer [name] wound bed under iodosorb with dressing changes Prim Dressing: Iodosorb Gel 10 (gm) Tube (Dispense As Written) 3 x Per Week/30 Days ary Discharge Instructions: Apply [name] wound bed as instructed Secondary Dressing: Woven Gauze Sponge, Non-Sterile 4x4 in (Generic) 3 x Per Week/30 Days Discharge Instructions: Apply over primary dressing as directed. Secondary Dressing: ABD Pad, 5x9 3 x Per Week/30 Days Discharge Instructions: Apply over primary dressing as directed. Secured With: [employer], 4.5x3.1 (in/yd) (Generic) 3 x Per Week/30 Days Discharge Instructions: Secure with Kerlix as directed. Secured With: Paper T ape, 2x10 (in/yd)  (Generic) 3 x Per Week/30 Days Discharge Instructions: Secure dressing with tape as directed. WOUND #2: - Calcaneus Wound Laterality: Left Cleanser: Soap and Water 3 x  Per Week/30 Days Discharge Instructions: May shower and wash wound with dial antibacterial soap and water prior [name] dressing change. Topical: Gentamicin 3 x Per Week/30 Days Discharge Instructions: Apply thin layer [name] wound bed under iodosorb with dressing changes Prim Dressing: Iodosorb Gel 10 (gm) Tube (Dispense As Written) 3 x Per Week/30 Days ary Discharge Instructions: Apply [name] wound bed as instructed Secondary Dressing: Woven Gauze Sponge, Non-Sterile 4x4 in (Generic) 3 x Per Week/30 Days Discharge Instructions: Apply over primary dressing as directed. Secondary Dressing: ABD Pad, 5x9 3 x Per Week/30 Days Discharge Instructions: Apply over primary dressing as directed. Secured With: [employer], 4.5x3.1 (in/yd) (Generic) 3 x Per Week/30 Days Discharge Instructions: Secure with Kerlix as directed. Secured With: Paper T ape, 2x10 (in/yd) (Generic) 3 x Per Week/30 Days Discharge Instructions: Secure dressing with tape as directed. 1. Would recommend currently that we [name] ahead and continue with the Iodoflex I think that still the best way [name] [name] in the wounds look much better in my opinion. 2. I am also can recommend at this time that we have the patient continue [name] monitor for any signs of worsening infection such as increased pain or even greater drainage. With that being said I am hopeful that the Cipro will get things under control while we await the results of the culture which I did obtain today as well. However he is on a couple of medications trazodone and potentially Celexa which can cause QT prolongation which has me concerned. Using only make sure that he is actually taking those were if he can potentially stop the trazodone. If either of those are the case that he is not taking it or he can stop them that I  would be okay with prescribing the Cipro otherwise right now just a new topical gentamicin which I did send into the pharmacy at this point. We will see patient back for reevaluation in 1 week here in the clinic. If anything worsens or changes patient will contact our office for additional recommendations. Electronic Signature(s) Signed: 07/22/2020 9:53:07 AM By: Worthy Keeler PA-C Entered By: Worthy Keeler on 07/22/2020 09:53:07 -------------------------------------------------------------------------------- SuperBill Details Patient Name: Date of Service: [name], [name] 07/22/2020 Medical Record Number: [id number] Patient Account Number: [id number] Date of Birth/Sex: Treating RN: [birth year] (44 y.o. [name] Primary Care Provider: Jan Fireman Other Clinician: Referring Provider: Treating Provider/Extender: Vira Agar, [place] URA Weeks in Treatment: 1 Diagnosis Coding ICD-10 Codes Code Description [phone] Pressure ulcer of other site, stage 3 M62.81 Muscle weakness (generalized) I10 Essential (primary) hypertension Facility Procedures CPT4 Code: [id number] Description: 99214 - WOUND CARE VISIT-LEV 4 EST PT Modifier: Quantity: 1 Physician Procedures : CPT4 Code Description Modifier [id number] - WC PHYS LEVEL 4 - EST PT ICD-10 Diagnosis Description L89.893 Pressure ulcer of other site, stage 3 M62.81 Muscle weakness (generalized) I10 Essential (primary) hypertension Quantity: 1 Electronic Signature(s) Signed: 07/22/2020 9:53:31 AM By: Worthy Keeler PA-C Entered By: Worthy Keeler on 07/22/2020 09:53:30

## 2020-07-22 NOTE — Progress Notes (Signed)
[name], [name] ([id number]) Visit Report for 07/22/2020 Arrival Information Details Patient Name: Date of Service: [name], [name] 07/22/2020 8:45 A M Medical Record Number: [id number] Patient Account Number: [id number] Date of Birth/Sex: Treating [name]: [birth year] (44 y.o. [name], [name] Primary Care [name]: [name] Other Clinician: Referring [name]: Treating [name]/Extender: [name] in Treatment: 1 Visit Information History Since Last Visit Added or deleted any medications: No Patient Arrived: Wheel Chair Any new allergies or adverse reactions: No Arrival Time: 09:09 Had a fall or experienced change in No Accompanied By: wife activities of daily living that may affect Transfer Assistance: None risk of falls: Patient Identification Verified: Yes Signs or symptoms of abuse/neglect since last visito No Secondary Verification Process Completed: Yes Hospitalized since last visit: No Patient Requires Transmission-Based Precautions: No Implantable device outside of the clinic excluding No Patient Has Alerts: No cellular tissue based products placed in the center since last visit: Has Dressing in Place as Prescribed: Yes Pain Present Now: Yes Electronic Signature(s) Signed: 07/22/2020 9:59:17 AM By: [name] Entered By: [name] on 07/22/2020 09:09:35 -------------------------------------------------------------------------------- Clinic Level of Care Assessment Details Patient Name: Date of Service: [name], [place]. 07/22/2020 8:45 A M Medical Record Number: [id number] Patient Account Number: [id number] Date of Birth/Sex: Treating [name]: 1975-11-01 (44 y.o. [name] Primary Care [name]: [name] Other Clinician: Referring [name]: Treating [name]/Extender: [name] in Treatment: 1 Clinic Level of Care Assessment Items TOOL 4 Quantity Score []  - 0 Use when only an EandM  is performed on FOLLOW-UP visit ASSESSMENTS - Nursing Assessment / Reassessment X- 1 10 Reassessment of Co-morbidities (includes updates in patient status) X- 1 5 Reassessment of Adherence [name] Treatment Plan ASSESSMENTS - Wound and Skin A ssessment / Reassessment []  - 0 Simple Wound Assessment / Reassessment - one wound X- 2 5 Complex Wound Assessment / Reassessment - multiple wounds []  - 0 Dermatologic / Skin Assessment (not related [name] wound area) ASSESSMENTS - Focused Assessment X- 2 5 Circumferential Edema Measurements - multi extremities []  - 0 Nutritional Assessment / Counseling / Intervention X- 1 5 Lower Extremity Assessment (monofilament, tuning fork, pulses) []  - 0 Peripheral Arterial Disease Assessment (using hand held doppler) ASSESSMENTS - Ostomy and/or Continence Assessment and Care []  - 0 Incontinence Assessment and Management []  - 0 Ostomy Care Assessment and Management (repouching, etc.) PROCESS - Coordination of Care X - Simple Patient / Family Education for ongoing care 1 15 []  - 0 Complex (extensive) Patient / Family Education for ongoing care X- 1 10 Staff obtains Programmer, systems, Records, T Results / Process Orders est []  - 0 Staff telephones HHA, Nursing Homes / Clarify orders / etc []  - 0 Routine Transfer [name] another Facility (non-emergent condition) []  - 0 Routine Hospital Admission (non-emergent condition) []  - 0 New Admissions / Biomedical engineer / Ordering NPWT Apligraf, etc. , []  - 0 Emergency Hospital Admission (emergent condition) X- 1 10 Simple Discharge Coordination []  - 0 Complex (extensive) Discharge Coordination PROCESS - Special Needs []  - 0 Pediatric / Minor Patient Management []  - 0 Isolation Patient Management []  - 0 Hearing / Language / Visual special needs []  - 0 Assessment of Community assistance (transportation, D/C planning, etc.) []  - 0 Additional assistance / Altered mentation []  - 0 Support Surface(s) Assessment  (bed, cushion, seat, etc.) INTERVENTIONS - Wound Cleansing / Measurement []  - 0 Simple Wound Cleansing - one wound X- 2 5 Complex Wound  Cleansing - multiple wounds X- 1 5 Wound Imaging (photographs - any number of wounds) []  - 0 Wound Tracing (instead of photographs) []  - 0 Simple Wound Measurement - one wound X- 2 5 Complex Wound Measurement - multiple wounds INTERVENTIONS - Wound Dressings X - Small Wound Dressing one or multiple wounds 2 10 []  - 0 Medium Wound Dressing one or multiple wounds []  - 0 Large Wound Dressing one or multiple wounds X- 1 5 Application of Medications - topical []  - 0 Application of Medications - injection INTERVENTIONS - Miscellaneous []  - 0 External ear exam []  - 0 Specimen Collection (cultures, biopsies, blood, body fluids, etc.) []  - 0 Specimen(s) / Culture(s) sent or taken [name] Lab for analysis []  - 0 Patient Transfer (multiple staff / Civil Service fast streamer / Similar devices) []  - 0 Simple Staple / Suture removal (25 or less) []  - 0 Complex Staple / Suture removal (26 or more) []  - 0 Hypo / Hyperglycemic Management (close monitor of Blood Glucose) []  - 0 Ankle / Brachial Index (ABI) - do not check if billed separately X- 1 5 Vital Signs Has the patient been seen at the hospital within the last three years: Yes Total Score: 130 Level Of Care: New/Established - Level 4 Electronic Signature(s) Signed: 07/22/2020 5:29:15 PM By: [name], BSN Entered By: [name] Gouty on 07/22/2020 09:41:06 -------------------------------------------------------------------------------- Encounter Discharge Information Details Patient Name: Date of Service: [name], [name] 07/22/2020 8:45 A M Medical Record Number: [id number] Patient Account Number: [id number] Date of Birth/Sex: Treating [name]: [birth year] (44 y.o. [name] Primary Care Chad Tiznado: [name] Other Clinician: Referring Jahnavi Muratore: Treating Emmajean Ratledge/Extender: [name] in Treatment: 1 Encounter Discharge Information Items Discharge Condition: Stable Ambulatory Status: Wheelchair Discharge Destination: Home Transportation: Private Auto Accompanied By: wife Schedule Follow-up Appointment: Yes Clinical Summary of Care: Patient Declined Electronic Signature(s) Signed: 07/22/2020 5:05:31 PM By: Carlene Coria [name] Entered By: Carlene Coria on 07/22/2020 09:59:55 -------------------------------------------------------------------------------- Lower Extremity Assessment Details Patient Name: Date of Service: [name], [place] [name] 07/22/2020 8:45 A M Medical Record Number: [id number] Patient Account Number: [id number] Date of Birth/Sex: Treating [name]: 08/11/1975 (45 y.o. [name], [name] Primary Care Marialena Wollen: [name] Other Clinician: Referring Lorriane Dehart: Treating Jennalynn Rivard/Extender: [name] in Treatment: 1 Edema Assessment Assessed: [name]: Yes] [name]: No] Edema: [Left: Yes] [Right: Yes] Calf Left: Right: [name] of Measurement: 34 cm From Medial Instep 41.2 cm 40.5 cm Ankle Left: Right: [name] of Measurement: 12 cm From Medial Instep 25 cm 24.5 cm Vascular Assessment Pulses: Dorsalis Pedis Palpable: [Left:Yes] [Right:Yes] Posterior Tibial Palpable: [Left:Yes] [Right:Yes] Electronic Signature(s) Signed: 07/22/2020 9:59:17 AM By: [name] Entered By: [name] on 07/22/2020 09:12:01 -------------------------------------------------------------------------------- Multi-Disciplinary Care Plan Details Patient Name: Date of Service: [name], [place] 07/22/2020 8:45 A M Medical Record Number: [id number] Patient Account Number: [id number] Date of Birth/Sex: Treating [name]: January 21, 1976 (44 y.o. [name] Primary Care Mattheo Swindle: [name] Other Clinician: Referring Diana Armijo: Treating Gabbriella Presswood/Extender: [name] in Treatment: 1 Active Inactive Abuse / Safety /  Falls / Self Care Management Nursing Diagnoses: Potential for falls Goals: Patient/caregiver will verbalize/demonstrate measures taken [name] prevent injury and/or falls Date Initiated: 07/15/2020 Target Resolution Date: 08/12/2020 Goal Status: Active Interventions: Assess fall risk on admission and as needed Assess impairment of mobility on admission and as needed per policy Notes: Pressure Nursing Diagnoses: Knowledge deficit related [name] management of pressures ulcers Potential for impaired  tissue integrity related [name] pressure, friction, moisture, and shear Goals: Patient/caregiver will verbalize understanding of pressure ulcer management Date Initiated: 07/15/2020 Target Resolution Date: 08/12/2020 Goal Status: Active Interventions: Assess: immobility, friction, shearing, incontinence upon admission and as needed Assess potential for pressure ulcer upon admission and as needed Notes: Wound/Skin Impairment Nursing Diagnoses: Impaired tissue integrity Knowledge deficit related [name] ulceration/compromised skin integrity Goals: Patient/caregiver will verbalize understanding of skin care regimen Date Initiated: 07/15/2020 Target Resolution Date: 08/12/2020 Goal Status: Active Ulcer/skin breakdown will have a volume reduction of 30% by week 4 Date Initiated: 07/15/2020 Target Resolution Date: 08/12/2020 Goal Status: Active Interventions: Assess patient/caregiver ability [name] obtain necessary supplies Assess patient/caregiver ability [name] perform ulcer/skin care regimen upon admission and as needed Assess ulceration(s) every visit Provide education on ulcer and skin care Treatment Activities: Skin care regimen initiated : 07/15/2020 Topical wound management initiated : 07/15/2020 Notes: Electronic Signature(s) Signed: 07/22/2020 5:29:15 PM By: [name], BSN Entered By: [name] Gouty on 07/22/2020  09:21:25 -------------------------------------------------------------------------------- Pain Assessment Details Patient Name: Date of Service: [name], [place] [name] 07/22/2020 8:45 A M Medical Record Number: [id number] Patient Account Number: [id number] Date of Birth/Sex: Treating [name]: Apr 17, 1976 (44 y.o. [name], [name] Primary Care Ayslin Kundert: [name] Other Clinician: Referring Lyrah Bradt: Treating Nyjah Denio/Extender: [name] in Treatment: 1 Active Problems Location of Pain Severity and Description of Pain Patient Has Paino Yes Site Locations Pain Location: Pain in Ulcers With Dressing Change: Yes Duration of the Pain. Constant / Intermittento Intermittent Rate the pain. Current Pain Level: 0 Worst Pain Level: 10 Least Pain Level: 0 Tolerable Pain Level: 7 Character of Pain Describe the Pain: Aching Pain Management and Medication Current Pain Management: Medication: Yes Cold Application: No Rest: Yes Massage: No Activity: No T.[name].N.S.: No Heat Application: No Leg drop or elevation: No Is the Current Pain Management Adequate: Adequate How does your wound impact your activities of daily livingo Sleep: No Bathing: No Appetite: No Relationship With Others: No Bladder Continence: No Emotions: No Bowel Continence: No Work: No Toileting: No Drive: No Dressing: No Hobbies: No Electronic Signature(s) Signed: 07/22/2020 9:59:17 AM By: [name] Entered By: [name] on 07/22/2020 09:07:51 -------------------------------------------------------------------------------- Patient/Caregiver Education Details Patient Name: Date of Service: [name] 12/15/2021andnbsp8:45 A M Medical Record Number: [id number] Patient Account Number: [id number] Date of Birth/Gender: Treating [name]: 1976/05/14 (44 y.o. [name] Primary Care Physician: [name] Other Clinician: Referring Physician: Treating  Physician/Extender: [name] in Treatment: 1 Education Assessment Education Provided [name]: Patient Education Topics Provided Nutrition: Methods: Explain/Verbal Responses: Reinforcements needed, State content correctly Offloading: Methods: Explain/Verbal Responses: Reinforcements needed, State content correctly Wound/Skin Impairment: Methods: Explain/Verbal Responses: Reinforcements needed, State content correctly Electronic Signature(s) Signed: 07/22/2020 5:29:15 PM By: [name], BSN Entered By: [name] Gouty on 07/22/2020 06:30:16 -------------------------------------------------------------------------------- Wound Assessment Details Patient Name: Date of Service: [name], [name] 07/22/2020 8:45 A M Medical Record Number: [id number] Patient Account Number: [id number] Date of Birth/Sex: Treating [name]: 1975-09-29 (44 y.o. [name] Primary Care Kolsen Choe: [name] Other Clinician: Referring Adiyah Lame: Treating Murriel Eidem/Extender: [name] in Treatment: 1 Wound Status Wound Number: 1 Primary Etiology: Pressure Ulcer Wound Location: Right Calcaneus Wound Status: Open Wounding Event: Pressure Injury Comorbid History: Asthma, Angina, Hypertension Date Acquired: 10/07/2019 Weeks Of Treatment: 1 Clustered Wound: No Wound Measurements Length: (cm) 8.4 Width: (cm) 3.5 Depth: (cm) 0.3 Area: (cm) 23.091 Volume: (cm) 6.927 % Reduction  in Area: 27% % Reduction in Volume: 78.1% Epithelialization: Small (1-33%) Tunneling: No Undermining: Yes Starting Position (o'clock): 1 Ending Position (o'clock): 3 Maximum Distance: (cm) 0.3 Wound Description Classification: Category/Stage III Wound Margin: Distinct, outline attached Exudate Amount: Medium Exudate Type: Serosanguineous Exudate Color: red, brown Foul Odor After Cleansing: No Slough/Fibrino Yes Wound Bed Granulation Amount: Small (1-33%) Exposed  Structure Granulation Quality: Red Fascia Exposed: No Necrotic Amount: Large (67-100%) Fat Layer (Subcutaneous Tissue) Exposed: Yes Necrotic Quality: Adherent Slough Tendon Exposed: No Muscle Exposed: No Joint Exposed: No Bone Exposed: No Treatment Notes Wound #1 (Calcaneus) Wound Laterality: Right Cleanser Soap and Water Discharge Instruction: May shower and wash wound with dial antibacterial soap and water prior [name] dressing change. Peri-Wound Care Topical Gentamicin Discharge Instruction: Apply thin layer [name] wound bed under iodosorb with dressing changes Primary Dressing Iodosorb Gel 10 (gm) Tube Discharge Instruction: Apply [name] wound bed as instructed Secondary Dressing Woven Gauze Sponge, Non-Sterile 4x4 in Discharge Instruction: Apply over primary dressing as directed. ABD Pad, 5x9 Discharge Instruction: Apply over primary dressing as directed. Secured With [employer], 4.5x3.1 (in/yd) Discharge Instruction: Secure with Kerlix as directed. Paper Tape, 2x10 (in/yd) Discharge Instruction: Secure dressing with tape as directed. Compression Wrap Compression Stockings Add-Ons Electronic Signature(s) Signed: 07/22/2020 5:05:31 PM By: Carlene Coria [name] Entered By: Carlene Coria on 07/22/2020 09:24:22 -------------------------------------------------------------------------------- Wound Assessment Details Patient Name: Date of Service: [name], [place] 07/22/2020 8:45 A M Medical Record Number: [id number] Patient Account Number: [id number] Date of Birth/Sex: Treating [name]: [birth year] (44 y.o. [name]) Carlene Coria Primary Care Marlenne Ridge: [name] Other Clinician: Referring Izaac Reisig: Treating Shamaine Mulkern/Extender: [name] in Treatment: 1 Wound Status Wound Number: 2 Primary Etiology: Pressure Ulcer Wound Location: Left Calcaneus Wound Status: Open Wounding Event: Pressure Injury Comorbid History: Asthma, Angina, Hypertension Date Acquired:  10/07/2019 Weeks Of Treatment: 1 Clustered Wound: No Wound Measurements Length: (cm) 8.3 Width: (cm) 3.3 Depth: (cm) 0.3 Area: (cm) 21.512 Volume: (cm) 6.454 % Reduction in Area: 20.6% % Reduction in Volume: 76.2% Epithelialization: None Tunneling: No Undermining: Yes Starting Position (o'clock): 11 Ending Position (o'clock): 3 Maximum Distance: (cm) 0.3 Wound Description Classification: Category/Stage III Wound Margin: Distinct, outline attached Exudate Amount: Medium Exudate Type: Serosanguineous Exudate Color: red, brown Foul Odor After Cleansing: No Slough/Fibrino Yes Wound Bed Granulation Amount: Small (1-33%) Exposed Structure Granulation Quality: Red Fascia Exposed: No Necrotic Amount: Large (67-100%) Fat Layer (Subcutaneous Tissue) Exposed: Yes Necrotic Quality: Adherent Slough Tendon Exposed: No Muscle Exposed: No Joint Exposed: No Bone Exposed: No Treatment Notes Wound #2 (Calcaneus) Wound Laterality: Left Cleanser Soap and Water Discharge Instruction: May shower and wash wound with dial antibacterial soap and water prior [name] dressing change. Peri-Wound Care Topical Gentamicin Discharge Instruction: Apply thin layer [name] wound bed under iodosorb with dressing changes Primary Dressing Iodosorb Gel 10 (gm) Tube Discharge Instruction: Apply [name] wound bed as instructed Secondary Dressing Woven Gauze Sponge, Non-Sterile 4x4 in Discharge Instruction: Apply over primary dressing as directed. ABD Pad, 5x9 Discharge Instruction: Apply over primary dressing as directed. Secured With [employer], 4.5x3.1 (in/yd) Discharge Instruction: Secure with Kerlix as directed. Paper Tape, 2x10 (in/yd) Discharge Instruction: Secure dressing with tape as directed. Compression Wrap Compression Stockings Add-Ons Electronic Signature(s) Signed: 07/22/2020 5:05:31 PM By: Carlene Coria [name] Entered By: Carlene Coria on 07/22/2020  09:24:50 -------------------------------------------------------------------------------- Wound Assessment Details Patient Name: Date of Service: [name], [place] 07/22/2020 8:45 A M Medical Record Number: [id number] Patient Account Number: [id number] Date  of Birth/Sex: Treating [name]: [birth year] (44 y.o. [name]) Carlene Coria Primary Care Raiden Yearwood: [name] Other Clinician: Referring Johnasia Liese: Treating Athalie Newhard/Extender: [name] in Treatment: 1 Wound Status Wound Number: 3 Primary Etiology: Pressure Ulcer Wound Location: Right, Plantar Foot Wound Status: Open Wounding Event: Pressure Injury Comorbid History: Asthma, Angina, Hypertension Date Acquired: 10/07/2019 Weeks Of Treatment: 1 Clustered Wound: No Wound Measurements Length: (cm) Width: (cm) Depth: (cm) Area: (cm) Volume: (cm) 0 % Reduction in Area: 100% 0 % Reduction in Volume: 100% 0 Epithelialization: Large (67-100%) 0 Tunneling: No 0 Undermining: No Wound Description Classification: Category/Stage III Wound Margin: Distinct, outline attached Exudate Amount: None Present Foul Odor After Cleansing: No Slough/Fibrino No Wound Bed Granulation Amount: None Present (0%) Exposed Structure Necrotic Amount: None Present (0%) Fascia Exposed: No Fat Layer (Subcutaneous Tissue) Exposed: No Tendon Exposed: No Muscle Exposed: No Joint Exposed: No Bone Exposed: No Electronic Signature(s) Signed: 07/22/2020 5:05:31 PM By: Carlene Coria [name] Entered By: Carlene Coria on 07/22/2020 09:23:24 -------------------------------------------------------------------------------- [place] Details Patient Name: Date of Service: [name], [place] [name] 07/22/2020 8:45 A M Medical Record Number: [id number] Patient Account Number: [id number] Date of Birth/Sex: Treating [name]: Jun 23, 1976 (44 y.o. [name], [name] Primary Care Ruthmary Occhipinti: [name] Other Clinician: Referring Jenness Stemler: Treating [name]/Extender:  [name] in Treatment: 1 Vital Signs Time Taken: 09:08 Temperature (F): 97.7 Height (in): 69 Pulse (bpm): 80 Weight (lbs): 280 Respiratory Rate (breaths/min): 18 Body Mass Index (BMI): 41.3 Blood Pressure (mmHg): 150/70 Reference Range: 80 - 120 mg / dl Electronic Signature(s) Signed: 07/22/2020 9:59:17 AM By: [name] Entered By: [name] on 07/22/2020 09:09:03

## 2020-07-24 ENCOUNTER — Other Ambulatory Visit: Payer: Self-pay | Admitting: Physical Medicine and Rehabilitation

## 2020-07-24 ENCOUNTER — Telehealth: Payer: Self-pay | Admitting: *Deleted

## 2020-07-24 NOTE — Telephone Encounter (Signed)
This call is in reference to the call received on 07/22/20 about his being prescribed Cipro by his wound care physician.  The MD was concerned about some issues this medication would potentially create with Celexa Duloxetine and Trazodone, and that some of these cannot be stopped suddenly..  I explained to him that Dr Dagoberto Ligas is not here in the office and I will have to send to Dr Ranell Patrick to see if she knows anything about the interactions. He reports that the Cipro has not been sent to the pharmacy yet so he was going to follow up with the wound center.   Please advise.

## 2020-07-25 LAB — AEROBIC CULTURE W GRAM STAIN (SUPERFICIAL SPECIMEN): Gram Stain: NONE SEEN

## 2020-07-27 ENCOUNTER — Ambulatory Visit (INDEPENDENT_AMBULATORY_CARE_PROVIDER_SITE_OTHER): Payer: Managed Care, Other (non HMO) | Admitting: Podiatry

## 2020-07-27 ENCOUNTER — Other Ambulatory Visit: Payer: Self-pay

## 2020-07-27 ENCOUNTER — Encounter: Payer: Self-pay | Admitting: Podiatry

## 2020-07-27 DIAGNOSIS — M79674 Pain in right toe(s): Secondary | ICD-10-CM | POA: Diagnosis not present

## 2020-07-27 DIAGNOSIS — M79675 Pain in left toe(s): Secondary | ICD-10-CM

## 2020-07-27 DIAGNOSIS — L97512 Non-pressure chronic ulcer of other part of right foot with fat layer exposed: Secondary | ICD-10-CM | POA: Diagnosis not present

## 2020-07-27 DIAGNOSIS — L97522 Non-pressure chronic ulcer of other 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: Secondary | ICD-10-CM

## 2020-07-27 DIAGNOSIS — B351 Tinea unguium: Secondary | ICD-10-CM | POA: Diagnosis not present

## 2020-07-27 NOTE — Progress Notes (Signed)
   HPI: 44 y.o. male presenting today for follow-up evaluation of bilateral wounds to the weightbearing surfaces of the bilateral feet.  He just recently was approved to begin going to the wound care center.  He is currently under active management at the [place] care center and goes weekly.  No new complaints at this time.  Past Medical History:  Diagnosis Date  . Anginal pain ([place])    with covid  . Anxiety   . Asthma   . Dyspnea   . GERD (gastroesophageal reflux disease)   . Headache   . History of kidney stones    LEFT URETERAL STONE  . HTN (hypertension)   . Pancreatitis 2018   GALLBALDDER SLUDGE CAUSED ISSUED RESOLVED  . Pneumonia 07/2019   covid    Physical Exam: General: The patient is alert and oriented x3 in no acute distress.  Dermatology: Full-thickness pressure ulcers noted to the bilateral weightbearing surfaces of the feet across the entire sole of the foot.  Wound matrix grafts have completely absorbed/disintegrated into the tissues.  Measurements of the wounds are approximately the same measuring approximately 6.0x4.0 x 1.0 cm (LxWxD). To the noted ulcerations there is some intermittent granulation tissue throughout the wound.  There is no exposed bone muscle tendon ligament or joint, with exception of some deeper underlying plantar fascial ligament to the right heel area.  There is also some increased serous drainage noted from this area which does appear to be improved since last visit.  No malodor noted.  No purulence noted.  Periwound integrity is intact.  Vascular: Palpable pedal pulses bilaterally.  There is some minimal edema edema noted. Capillary refill within normal limits.  Neurological: Epicritic and protective threshold grossly intact bilaterally.   Musculoskeletal Exam: Range of motion within normal limits to all pedal and ankle joints bilateral. Muscle strength 5/5 in all groups bilateral.   Assessment: 1.  Full-thickness pressure ulcers  bilateral feet 2.  Pain in bilateral feet 3.  History of COVID-19 hospitalization.  Discharge 12/04/2019 4.  Status post debridement with application of skin substitute bilateral feet.  DOS: 03/27/2020   Plan of Care:  1. Patient evaluated.  Cultures reviewed today 2.  Medically necessary excisional debridement including muscle and deep fascial tissue was performed using a tissue nipper.  Excisional debridement of the necrotic nonviable tissue down to healthy bleeding viable tissue was performed with post debridement measurement same as pre-.  Dry sterile dressings applied 3.  Continue management of wounds at the  [facility] wound care center  4.  Note for work was provided today stating the patient continues to be actively under my care.  5. return to clinic in 8 weeks  Edrick Kins, DPM [facility]  Dr. Edrick Kins, DPM    [address], [place] [address]                Office [phone]  Fax [phone]

## 2020-07-27 NOTE — Telephone Encounter (Signed)
I have sent message to patient advising that Cipro can interact with these medications resulting in arrhythmia and to check with wound care if alternative antibiotic could be prescribed.

## 2020-07-28 ENCOUNTER — Encounter (HOSPITAL_BASED_OUTPATIENT_CLINIC_OR_DEPARTMENT_OTHER): Payer: Managed Care, Other (non HMO) | Admitting: Internal Medicine

## 2020-07-28 DIAGNOSIS — L89893 Pressure ulcer of other site, stage 3: Secondary | ICD-10-CM | POA: Diagnosis not present

## 2020-07-28 NOTE — Telephone Encounter (Signed)
I spoke with [name] and he said he sees the wound car ecenter today.  He was given a cream and was told the culture didn't show anything so he may not need the abx anyway.

## 2020-07-29 ENCOUNTER — Ambulatory Visit (INDEPENDENT_AMBULATORY_CARE_PROVIDER_SITE_OTHER): Payer: Managed Care, Other (non HMO) | Admitting: Neurology

## 2020-07-29 ENCOUNTER — Encounter: Payer: Self-pay | Admitting: Neurology

## 2020-07-29 VITALS — BP 150/85 | HR 72

## 2020-07-29 DIAGNOSIS — G479 Sleep disorder, unspecified: Secondary | ICD-10-CM

## 2020-07-29 DIAGNOSIS — G7281 Critical illness myopathy: Secondary | ICD-10-CM | POA: Diagnosis not present

## 2020-07-29 DIAGNOSIS — R269 Unspecified abnormalities of gait and mobility: Secondary | ICD-10-CM | POA: Diagnosis not present

## 2020-07-29 NOTE — Telephone Encounter (Signed)
OK- well let me know if and when needs Cipro- thank you

## 2020-07-29 NOTE — Progress Notes (Signed)
Reason for visit: Critical illness myopathy and neuropathy  [name] is an 44 y.o. male  History of present illness:  [name] is a 44 year old right-handed white male with a history of a Covid infection in December 2020 that has left him with long-term deficits.  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  He has been treated for pressure sores of the feet, he had a skin graft done in August 2021, he still has open sores in the feet and is followed regularly for this.  He can walk short distances with a walker, he has not had any falls.  He has regained some strength in the arms and legs but still has a prominent foot drop on the left.  The patient does have some discomfort in the feet and in the hands at times, he takes pain medications if needed.  He returns to this office for an evaluation.  Past Medical History:  Diagnosis Date  . Anginal pain ([place])    with covid  . Anxiety   . Asthma   . Dyspnea   . GERD (gastroesophageal reflux disease)   . Headache   . History of kidney stones    LEFT URETERAL STONE  . HTN (hypertension)   . Pancreatitis 2018   GALLBALDDER SLUDGE CAUSED ISSUED RESOLVED  . Pneumonia 07/2019   covid    Past Surgical History:  Procedure Laterality Date  . CANNULATION FOR ECMO (EXTRACORPOREAL MEMBRANE OXYGENATION) N/A 08/28/2019   Procedure: CANNULATION FOR VV ECMO (EXTRACORPOREAL MEMBRANE OXYGENATION);  Surgeon: Prescott Gum, Collier Salina, MD;  Location: [facility];  Service: Open Heart Surgery;  Laterality: N/A;  CRESCENT CANNULA  . CANNULATION FOR ECMO (EXTRACORPOREAL MEMBRANE OXYGENATION) N/A 09/10/2019   Procedure: CANNULATION FOR ECMO (EXTRACORPOREAL MEMBRANE OXYGENATION) PUTTING IN CRESCENT 32FR CANNULA  AND REMOVING GROING CANNULATION;  Surgeon: Wonda Olds, MD;  Location: [facility];  Service: Open Heart Surgery;  Laterality: N/A;  PUTTING IN CRESCENT/REMOVING GROIN CANNULATION  .  CYSTOSCOPY/URETEROSCOPY/HOLMIUM LASER/STENT PLACEMENT Left 04/18/2019   Procedure: LEFT URETEROSCOPY/HOLMIUM LASER/STENT PLACEMENT;  Surgeon: Ardis Hughs, MD;  Location: [facility];  Service: Urology;  Laterality: Left;  . CYSTOSCOPY/URETEROSCOPY/HOLMIUM LASER/STENT PLACEMENT Left 05/02/2019   Procedure: CYSTOSCOPY/URETEROSCOPY/HOLMIUM LASER/STENT EXCHANGE;  Surgeon: Ardis Hughs, MD;  Location: [facility];  Service: Urology;  Laterality: Left;  . ECMO CANNULATION N/A 08/03/2019   Procedure: ECMO CANNULATION;  Surgeon: Wonda Olds, MD;  Location: [facility];  Service: Cardiovascular;  Laterality: N/A;  . ESOPHAGOGASTRODUODENOSCOPY N/A 09/11/2019   Procedure: ESOPHAGOGASTRODUODENOSCOPY (EGD);  Surgeon: Wonda Olds, MD;  [facility];  Service: Thoracic;  Laterality: N/A;  . GRAFT APPLICATION Bilateral 0/26/3785   Procedure: APPLICATION OF SKIN GRAFT BILATERAL FEET;  Surgeon: Edrick Kins, DPM;  Location: [facility];  Service: Podiatry;  Laterality: Bilateral;  . IR REPLC GASTRO/COLONIC TUBE PERCUT W/FLUORO  10/14/2019  . IRRIGATION AND DEBRIDEMENT SHOULDER Right 09/29/2017   Procedure: IRRIGATION AND DEBRIDEMENT SHOULDER;  Surgeon: Leandrew Koyanagi, MD;  Location: [facility];  Service: Orthopedics;  Laterality: Right;  . [place] [facility]  2002  . NASAL ENDOSCOPY WITH EPISTAXIS CONTROL N/A 08/31/2019   Procedure: NASAL ENDOSCOPY WITH EPISTAXIS CONTROL WITH CAUTERIZATION;  Surgeon: Melida Quitter, MD;  Location: [facility];  Service: ENT;  Laterality: N/A;  . PORTACATH PLACEMENT N/A 09/11/2019   Procedure: PEG TUBE INSERTION - BEDSIDE;  Surgeon: Wonda Olds, MD;  Location: [facility];  Service: Thoracic;  Laterality: N/A;  . TEE WITHOUT CARDIOVERSION N/A 08/28/2019   Procedure: TRANSESOPHAGEAL ECHOCARDIOGRAM (TEE);  Surgeon: Prescott Gum, Collier Salina, MD;  Location: [facility];  Service: Open Heart Surgery;  Laterality: N/A;  . TEE WITHOUT CARDIOVERSION N/A 09/10/2019   Procedure:  TRANSESOPHAGEAL ECHOCARDIOGRAM (TEE);  Surgeon: Wonda Olds, MD;  Location: [facility];  Service: Open Heart Surgery;  Laterality: N/A;  . WOUND DEBRIDEMENT Bilateral 03/27/2020   Procedure: EXCISIONAL DEBRIDEMENT OF ULCERS BILATERAL FEET;  Surgeon: Edrick Kins, DPM;  Location: [facility];  Service: Podiatry;  Laterality: Bilateral;    Family History  Problem Relation Age of Onset  . Asthma Brother   . Diabetes Father   . Hypertension Father   . Diabetes Paternal Grandmother   . Hypertension Paternal Grandmother   . Migraines Mother   . GER disease Mother   . Pancreatic cancer Maternal Grandmother   . Heart disease Maternal Grandfather     Social history:  reports that he has never smoked. He has never used smokeless tobacco. He reports current alcohol use. He reports that he does not use drugs.   No Known Allergies  Medications:  Prior to Admission medications   Medication Sig Start Date End Date Taking? Authorizing Provider  albuterol (VENTOLIN HFA) 108 (90 Base) MCG/ACT inhaler Inhale 2 puffs into the lungs every 6 (six) hours as needed for wheezing or shortness of breath. 06/04/20  Yes Valentina Shaggy, MD  amLODipine (NORVASC) 2.5 MG tablet Take 1 tablet (2.5 mg total) by mouth daily. 07/01/20  Yes Lovorn, Jinny Blossom, MD  cadexomer iodine (IODOSORB) 0.9 % gel Apply 1 application topically as directed. With dressing changes.   Yes [provider]  cetirizine (ZYRTEC) 10 MG tablet Take 1 tablet (10 mg total) by mouth daily. 02/06/18  Yes Kozlow, Donnamarie Poag, MD  citalopram (CELEXA) 20 MG tablet TAKE 1 TABLET BY MOUTH EVERY DAY 07/25/20  Yes Lovorn, Jinny Blossom, MD  clonazePAM (KLONOPIN) 1 MG tablet Take 1 mg by mouth at bedtime.   Yes [provider]  DULoxetine (CYMBALTA) 60 MG capsule Take 1 capsule (60 mg total) by mouth daily. 03/06/20  Yes Lovorn, Jinny Blossom, MD  gentamicin ointment (GARAMYCIN) 0.1 % Apply topically daily. With dressing changes 07/26/20  Yes [provider]  metoprolol tartrate (LOPRESSOR) 25 MG tablet TAKE 1 TABLET BY MOUTH TWICE A DAY 06/08/20  Yes Lovorn, Megan, MD  montelukast (SINGULAIR) 10 MG tablet Take 10 mg by mouth daily. 06/07/20  Yes [provider]  oxyCODONE-acetaminophen (PERCOCET) 5-325 MG tablet Take 1 tablet by mouth every 8 (eight) hours as needed for severe pain. 07/17/20  Yes Edrick Kins, DPM  pantoprazole (PROTONIX) 40 MG tablet Take 1 tablet (40 mg total) by mouth daily. 11/15/19  Yes Lyda Jester M, PA-C  SYMBICORT 160-4.5 MCG/ACT inhaler Inhale 2 puffs into the lungs daily as needed (cough or wheeze). Inhale two puffs twice daily 06/04/20  Yes Valentina Shaggy, MD  traZODone (DESYREL) 100 MG tablet Take 1-2 tablets (100-200 mg total) by mouth at bedtime. 07/01/20  Yes Lovorn, Jinny Blossom, MD  triamcinolone (NASACORT) 55 MCG/ACT AERO nasal inhaler Place 2 sprays into the nose daily.   Yes [provider]    ROS:  Out of a complete 14 system review of symptoms, the patient complains only of the following symptoms, and all other reviewed systems are negative.  Foot pain Weakness Numbness  Blood pressure (!) 150/85, pulse 72.  Physical Exam  General: The  patient is alert and cooperative at the time of the examination.  The patient is moderately to markedly obese.  Skin: No significant peripheral edema is noted.  The patient has healing shoes on bilaterally.   Neurologic Exam  Mental status: The patient is alert and oriented x 3 at the time of the examination. The patient has apparent normal recent and remote memory, with an apparently normal attention span and concentration ability.   Cranial nerves: Facial symmetry is present. Speech is normal, no aphasia or dysarthria is noted. Extraocular movements are full. Visual fields are full.  Motor: The patient has good strength in all 4 extremities, with exception of 4/5 strength of the biceps muscles bilaterally and with a prominent  left foot drop.  Sensory examination: Soft touch sensation is symmetric on the face, arms, and legs.  Coordination: The patient has good finger-nose-finger and heel-to-shin bilaterally.  Gait and station: The patient is able to walk with examiner, gait is somewhat wide-based.  Romberg is negative.  Reflexes: Deep tendon reflexes are symmetric, but are depressed.   Assessment/Plan:  1.  Critical illness myopathy and neuropathy  The patient is slowly recovering from his deficits.  The patient will be given a prescription for a rolling walker when he is able to bear weight on the feet for longer periods of time.  The patient he does to do muscle strengthening exercises at home.  The patient reports that he snores at night and does have some daytime fatigue and drowsiness and difficulty with focusing.  I will set him up for a sleep evaluation to exclude sleep apnea.  He will follow-up here in 6 months.  Jill Alexanders MD 07/29/2020 9:57 AM  [facility] [address] [place], [place] [address]  Phone [phone] Fax [phone]

## 2020-07-29 NOTE — Progress Notes (Signed)
TATEN, MERROW ([id number]) Visit Report for 07/28/2020 Debridement Details Patient Name: Date of Service: [name], [place] 07/28/2020 2:45 PM Medical Record Number: [id number] Patient Account Number: [id number] Date of Birth/Sex: Treating RN: [birth year] (44 y.o. [name], [name] Primary Care Provider: [name] Other Clinician: Referring Provider: Treating Provider/Extender: [name] in Treatment: 1 Debridement Performed for Assessment: Wound #1 Right Calcaneus Performed By: Physician Ricard Dillon., MD Debridement Type: Debridement Level of Consciousness (Pre-procedure): Awake and Alert Pre-procedure Verification/Time Out Yes - 16:05 Taken: Start Time: 16:06 Pain Control: Other : Benzocaine T Area Debrided (L x W): otal 8.2 (cm) x 2.8 (cm) = 22.96 (cm) Tissue and other material debrided: Viable, Non-Viable, Slough, Subcutaneous, Skin: Dermis , Skin: Epidermis, Slough Level: Skin/Subcutaneous Tissue Debridement Description: Excisional Instrument: Curette Bleeding: Minimum Hemostasis Achieved: Pressure End Time: 16:07 Procedural Pain: 0 Post Procedural Pain: 0 Response to Treatment: Procedure was tolerated well Level of Consciousness (Post- Awake and Alert procedure): Post Debridement Measurements of Total Wound Length: (cm) 8.2 Stage: Category/Stage III Width: (cm) 2.8 Depth: (cm) 0.5 Volume: (cm) 9.016 Character of Wound/Ulcer Post Debridement: Improved Post Procedure Diagnosis Same as Pre-procedure Electronic Signature(s) Signed: 07/28/2020 5:57:42 PM By: Linton Ham MD Signed: 07/29/2020 5:16:00 PM By: [name] RN Entered By: Linton Ham on 07/28/2020 16:28:06 -------------------------------------------------------------------------------- Debridement Details Patient Name: Date of Service: [name], [place]. 07/28/2020 2:45 PM Medical Record Number: [id number] Patient Account Number: [id number] Date of  Birth/Sex: Treating RN: 1976/03/30 (44 y.o. [name], [name] Primary Care Provider: [name] Other Clinician: Referring Provider: Treating Provider/Extender: [name] in Treatment: 1 Debridement Performed for Assessment: Wound #2 Left Calcaneus Performed By: Physician Ricard Dillon., MD Debridement Type: Debridement Level of Consciousness (Pre-procedure): Awake and Alert Pre-procedure Verification/Time Out Yes - 16:03 Taken: Start Time: 16:03 Pain Control: Other : Benzocaine T Area Debrided (L x W): otal 8 (cm) x 2.9 (cm) = 23.2 (cm) Tissue and other material debrided: Viable, Non-Viable, Slough, Subcutaneous, Skin: Dermis , Skin: Epidermis, Slough Level: Skin/Subcutaneous Tissue Debridement Description: Excisional Instrument: Curette Bleeding: Minimum Hemostasis Achieved: Pressure End Time: 16:04 Procedural Pain: 0 Post Procedural Pain: 0 Response to Treatment: Procedure was tolerated well Level of Consciousness (Post- Awake and Alert procedure): Post Debridement Measurements of Total Wound Length: (cm) 8 Stage: Category/Stage III Width: (cm) 2.9 Depth: (cm) 0.8 Volume: (cm) 14.577 Character of Wound/Ulcer Post Debridement: Improved Post Procedure Diagnosis Same as Pre-procedure Electronic Signature(s) Signed: 07/28/2020 5:57:42 PM By: Linton Ham MD Signed: 07/29/2020 5:16:00 PM By: [name] RN Entered By: Linton Ham on 07/28/2020 16:28:16 -------------------------------------------------------------------------------- HPI Details Patient Name: Date of Service: [name], [place]. 07/28/2020 2:45 PM Medical Record Number: [id number] Patient Account Number: [id number] Date of Birth/Sex: Treating RN: Sep 14, 1975 (44 y.o. [name] Primary Care Provider: [name] Other Clinician: Referring Provider: Treating Provider/Extender: [name] in Treatment: 1 History of Present  Illness HPI Description: 07/15/2020 upon evaluation today patient presents for initial inspection here in our clinic concerning issues he has been having with the bottoms of his feet bilaterally. He states these actually occurred as wounds when he was hospitalized for 5 months secondary to Covid. He was apparently with tilting bed where he was in an upright position quite frequently and apparently this occurred in some way shape or form during that time. Fortunately there is no sign of active infection at this time. No fevers, chills, nausea, vomiting, or diarrhea. With that  being said he still has substantial wounds on the plantar aspects of his feet Theragen require quite a bit of work to get these to heal. He has been using Santyl currently though that is been problematic both in receiving the medication as well as actually paid for it as it is become quite expensive. Prior to the experience with Covid the patient really did not have any major medical problems other than hypertension he does have some mild generalized weakness following the Covid experience. 07/22/2020 on evaluation today patient appears to be doing okay in regard to his foot ulcers I feel like the wound beds are showing signs of better improvement that I do believe the Iodoflex is helping in this regard. With that being said he does have a lot of drainage currently and this is somewhat blue/green in nature which is consistent with Pseudomonas. I do think a culture today would be appropriate for [place] to evaluate and see if that is indeed the case I would likely start him on antibiotic orally as well he is not allergic to Cipro knows of no issues he has had in the past 12/21; patient was admitted to the clinic earlier this month with bilateral presumed pressure ulcers on the bottom of his feet apparently related to excessive pressure from a tilt table arrangement in the intensive care unit. Patient relates this to being on ECMO but I am  not really sure that is exactly related to that. I must say I have never seen anything like this. He has fairly extensive full-thickness wounds extending from his heel towards his midfoot mostly centered laterally. There is already been some healing distally. He does not appear to have an arterial issue. He has been using gentamicin to the wound surfaces with Iodoflex to help with ongoing debridement Electronic Signature(s) Signed: 07/28/2020 5:57:42 PM By: Linton Ham MD Entered By: Linton Ham on 07/28/2020 16:31:26 -------------------------------------------------------------------------------- Physical Exam Details Patient Name: Date of Service: [name], [place] 07/28/2020 2:45 PM Medical Record Number: [id number] Patient Account Number: [id number] Date of Birth/Sex: Treating RN: 1976/06/14 (44 y.o. [name] Primary Care Provider: [name] Other Clinician: Referring Provider: Treating Provider/Extender: [name] in Treatment: 1 Constitutional Patient is hypertensive.. Pulse regular and within target range for patient.Marland Kitchen Respirations regular, non-labored and within target range.. Temperature is normal and within the target range for the patient.Marland Kitchen Appears in no distress. Notes Wound exam; this is an extensive area on both heels mirror-image tapering wounds were done along the lateral part of his foot towards the midfoot. There has been some healing distally. Not a very viable surface over these areas extensively. Some hyper granulation. I used a #5 curette to remove tightly fibrinous necrotic material from the wound surfaces hemostasis with direct pressure. Unfortunately I think further ongoing debridement mechanically and also with dressing choices is going to be necessary. I did not see any evidence of infection pedal pulses were palpable Electronic Signature(s) Signed: 07/28/2020 5:57:42 PM By: Linton Ham MD Entered By: Linton Ham on 07/28/2020 16:33:08 -------------------------------------------------------------------------------- Physician Orders Details Patient Name: Date of Service: [name], [place] 07/28/2020 2:45 PM Medical Record Number: [id number] Patient Account Number: [id number] Date of Birth/Sex: Treating RN: [birth year] (44 y.o. [name] Primary Care Provider: [name] Other Clinician: Referring Provider: Treating Provider/Extender: [name] in Treatment: 1 Verbal / Phone Orders: No Diagnosis Coding Follow-up Appointments Return Appointment in 2 weeks. Bathing/ Shower/ Hygiene May shower and  wash wound with soap and water. - wash wounds with dial antibacterial soap Edema Control - Lymphedema / SCD / Other Bilateral Lower Extremities Elevate legs to the level of the heart or above for 30 minutes daily and/or when sitting, a frequency of: Avoid standing for long periods of time. Off-Loading Other: - keep pressure off of the bottom of your feet Wound Treatment Wound #1 - Calcaneus Wound Laterality: Right Cleanser: Soap and Water 3 x Per Week/30 Days Discharge Instructions: May shower and wash wound with dial antibacterial soap and water prior to dressing change. Topical: Gentamicin 3 x Per Week/30 Days Discharge Instructions: Apply thin layer to wound bed under iodosorb with dressing changes Prim Dressing: Iodosorb Gel 10 (gm) Tube (Dispense As Written) 3 x Per Week/30 Days ary Discharge Instructions: Apply to wound bed as instructed Secondary Dressing: Woven Gauze Sponge, Non-Sterile 4x4 in (Generic) 3 x Per Week/30 Days Discharge Instructions: Apply over primary dressing as directed. Secondary Dressing: ABD Pad, 5x9 3 x Per Week/30 Days Discharge Instructions: Apply over primary dressing as directed. Secured With: [employer], 4.5x3.1 (in/yd) (Generic) 3 x Per Week/30 Days Discharge Instructions: Secure with Kerlix as directed. Secured  With: Paper Tape, 2x10 (in/yd) (Generic) 3 x Per Week/30 Days Discharge Instructions: Secure dressing with tape as directed. Wound #2 - Calcaneus Wound Laterality: Left Cleanser: Soap and Water 3 x Per Week/30 Days Discharge Instructions: May shower and wash wound with dial antibacterial soap and water prior to dressing change. Topical: Gentamicin 3 x Per Week/30 Days Discharge Instructions: Apply thin layer to wound bed under iodosorb with dressing changes Prim Dressing: Iodosorb Gel 10 (gm) Tube (Dispense As Written) 3 x Per Week/30 Days ary Discharge Instructions: Apply to wound bed as instructed Secondary Dressing: Woven Gauze Sponge, Non-Sterile 4x4 in (Generic) 3 x Per Week/30 Days Discharge Instructions: Apply over primary dressing as directed. Secondary Dressing: ABD Pad, 5x9 3 x Per Week/30 Days Discharge Instructions: Apply over primary dressing as directed. Secured With: [employer], 4.5x3.1 (in/yd) (Generic) 3 x Per Week/30 Days Discharge Instructions: Secure with Kerlix as directed. Secured With: Paper Tape, 2x10 (in/yd) (Generic) 3 x Per Week/30 Days Discharge Instructions: Secure dressing with tape as directed. Electronic Signature(s) Signed: 07/28/2020 5:57:42 PM By: Linton Ham MD Signed: 07/29/2020 5:16:00 PM By: [name] RN Entered By: [name] on 07/28/2020 16:09:15 -------------------------------------------------------------------------------- Problem List Details Patient Name: Date of Service: [name], [place] 07/28/2020 2:45 PM Medical Record Number: [id number] Patient Account Number: [id number] Date of Birth/Sex: Treating RN: 02-Jul-1976 (44 y.o. [name], [name] Primary Care Provider: [name] Other Clinician: Referring Provider: Treating Provider/Extender: [name] in Treatment: 1 Active Problems ICD-10 Encounter Code Description Active Date MDM Diagnosis [phone] Pressure ulcer of other  site, stage 3 07/15/2020 No Yes M62.81 Muscle weakness (generalized) 07/15/2020 No Yes I10 Essential (primary) hypertension 07/15/2020 No Yes Inactive Problems Resolved Problems Electronic Signature(s) Signed: 07/28/2020 5:57:42 PM By: Linton Ham MD Entered By: Linton Ham on 07/28/2020 16:27:43 -------------------------------------------------------------------------------- Progress Note Details Patient Name: Date of Service: [name], [place]. 07/28/2020 2:45 PM Medical Record Number: [id number] Patient Account Number: [id number] Date of Birth/Sex: Treating RN: 11-18-1975 (44 y.o. [name] Primary Care Provider: [name] Other Clinician: Referring Provider: Treating Provider/Extender: [name] in Treatment: 1 Subjective History of Present Illness (HPI) 07/15/2020 upon evaluation today patient presents for initial inspection here in our clinic concerning issues he has been having with the bottoms  of his feet bilaterally. He states these actually occurred as wounds when he was hospitalized for 5 months secondary to Covid. He was apparently with tilting bed where he was in an upright position quite frequently and apparently this occurred in some way shape or form during that time. Fortunately there is no sign of active infection at this time. No fevers, chills, nausea, vomiting, or diarrhea. With that being said he still has substantial wounds on the plantar aspects of his feet Theragen require quite a bit of work to get these to heal. He has been using Santyl currently though that is been problematic both in receiving the medication as well as actually paid for it as it is become quite expensive. Prior to the experience with Covid the patient really did not have any major medical problems other than hypertension he does have some mild generalized weakness following the Covid experience. 07/22/2020 on evaluation today patient appears to be doing  okay in regard to his foot ulcers I feel like the wound beds are showing signs of better improvement that I do believe the Iodoflex is helping in this regard. With that being said he does have a lot of drainage currently and this is somewhat blue/green in nature which is consistent with Pseudomonas. I do think a culture today would be appropriate for [place] to evaluate and see if that is indeed the case I would likely start him on antibiotic orally as well he is not allergic to Cipro knows of no issues he has had in the past 12/21; patient was admitted to the clinic earlier this month with bilateral presumed pressure ulcers on the bottom of his feet apparently related to excessive pressure from a tilt table arrangement in the intensive care unit. Patient relates this to being on ECMO but I am not really sure that is exactly related to that. I must say I have never seen anything like this. He has fairly extensive full-thickness wounds extending from his heel towards his midfoot mostly centered laterally. There is already been some healing distally. He does not appear to have an arterial issue. He has been using gentamicin to the wound surfaces with Iodoflex to help with ongoing debridement Objective Constitutional Patient is hypertensive.. Pulse regular and within target range for patient.Marland Kitchen Respirations regular, non-labored and within target range.. Temperature is normal and within the target range for the patient.Marland Kitchen Appears in no distress. Vitals Time Taken: 3:09 PM, Height: 69 in, Weight: 280 lbs, BMI: 41.3, Temperature: 98.3 F, Pulse: 87 bpm, Respiratory Rate: 18 breaths/min, Blood Pressure: 153/85 mmHg. General Notes: Wound exam; this is an extensive area on both heels mirror-image tapering wounds were done along the lateral part of his foot towards the midfoot. There has been some healing distally. Not a very viable surface over these areas extensively. Some hyper granulation. I used a #5 curette to  remove tightly fibrinous necrotic material from the wound surfaces hemostasis with direct pressure. Unfortunately I think further ongoing debridement mechanically and also with dressing choices is going to be necessary. I did not see any evidence of infection pedal pulses were palpable Integumentary (Hair, Skin) Wound #1 status is Open. Original cause of wound was Pressure Injury. The wound is located on the Right Calcaneus. The wound measures 8.2cm length x 2.8cm width x 0.5cm depth; 18.033cm^2 area and 9.016cm^3 volume. There is Fat Layer (Subcutaneous Tissue) exposed. There is no tunneling or undermining noted. There is a medium amount of serosanguineous drainage noted. The wound margin is  distinct with the outline attached to the wound base. There is small (1-33%) red granulation within the wound bed. There is a large (67-100%) amount of necrotic tissue within the wound bed including Adherent Slough. Wound #2 status is Open. Original cause of wound was Pressure Injury. The wound is located on the Left Calcaneus. The wound measures 8cm length x 2.9cm width x 0.8cm depth; 18.221cm^2 area and 14.577cm^3 volume. There is Fat Layer (Subcutaneous Tissue) exposed. There is no tunneling or undermining noted. There is a medium amount of serosanguineous drainage noted. The wound margin is distinct with the outline attached to the wound base. There is small (1-33%) red granulation within the wound bed. There is a large (67-100%) amount of necrotic tissue within the wound bed including Adherent Slough. Assessment Active Problems ICD-10 Pressure ulcer of other site, stage 3 Muscle weakness (generalized) Essential (primary) hypertension Procedures Wound #1 Pre-procedure diagnosis of Wound #1 is a Pressure Ulcer located on the Right Calcaneus . There was a Excisional Skin/Subcutaneous Tissue Debridement with a total area of 22.96 sq cm performed by Ricard Dillon., MD. With the following instrument(s):  Curette to remove Viable and Non-Viable tissue/material. Material removed includes Subcutaneous Tissue, Slough, Skin: Dermis, and Skin: Epidermis after achieving pain control using Other (Benzocaine). No specimens were taken. A time out was conducted at 16:05, prior to the start of the procedure. A Minimum amount of bleeding was controlled with Pressure. The procedure was tolerated well with a pain level of 0 throughout and a pain level of 0 following the procedure. Post Debridement Measurements: 8.2cm length x 2.8cm width x 0.5cm depth; 9.016cm^3 volume. Post debridement Stage noted as Category/Stage III. Character of Wound/Ulcer Post Debridement is improved. Post procedure Diagnosis Wound #1: Same as Pre-Procedure Wound #2 Pre-procedure diagnosis of Wound #2 is a Pressure Ulcer located on the Left Calcaneus . There was a Excisional Skin/Subcutaneous Tissue Debridement with a total area of 23.2 sq cm performed by Ricard Dillon., MD. With the following instrument(s): Curette to remove Viable and Non-Viable tissue/material. Material removed includes Subcutaneous Tissue, Slough, Skin: Dermis, and Skin: Epidermis after achieving pain control using Other (Benzocaine). No specimens were taken. A time out was conducted at 16:03, prior to the start of the procedure. A Minimum amount of bleeding was controlled with Pressure. The procedure was tolerated well with a pain level of 0 throughout and a pain level of 0 following the procedure. Post Debridement Measurements: 8cm length x 2.9cm width x 0.8cm depth; 14.577cm^3 volume. Post debridement Stage noted as Category/Stage III. Character of Wound/Ulcer Post Debridement is improved. Post procedure Diagnosis Wound #2: Same as Pre-Procedure Plan Follow-up Appointments: Return Appointment in 2 weeks. Bathing/ Shower/ Hygiene: May shower and wash wound with soap and water. - wash wounds with dial antibacterial soap Edema Control - Lymphedema / SCD /  Other: Elevate legs to the level of the heart or above for 30 minutes daily and/or when sitting, a frequency of: Avoid standing for long periods of time. Off-Loading: Other: - keep pressure off of the bottom of your feet WOUND #1: - Calcaneus Wound Laterality: Right Cleanser: Soap and Water 3 x Per Week/30 Days Discharge Instructions: May shower and wash wound with dial antibacterial soap and water prior to dressing change. Topical: Gentamicin 3 x Per Week/30 Days Discharge Instructions: Apply thin layer to wound bed under iodosorb with dressing changes Prim Dressing: Iodosorb Gel 10 (gm) Tube (Dispense As Written) 3 x Per Week/30 Days ary Discharge Instructions: Apply to  wound bed as instructed Secondary Dressing: Woven Gauze Sponge, Non-Sterile 4x4 in (Generic) 3 x Per Week/30 Days Discharge Instructions: Apply over primary dressing as directed. Secondary Dressing: ABD Pad, 5x9 3 x Per Week/30 Days Discharge Instructions: Apply over primary dressing as directed. Secured With: [employer], 4.5x3.1 (in/yd) (Generic) 3 x Per Week/30 Days Discharge Instructions: Secure with Kerlix as directed. Secured With: Paper T ape, 2x10 (in/yd) (Generic) 3 x Per Week/30 Days Discharge Instructions: Secure dressing with tape as directed. WOUND #2: - Calcaneus Wound Laterality: Left Cleanser: Soap and Water 3 x Per Week/30 Days Discharge Instructions: May shower and wash wound with dial antibacterial soap and water prior to dressing change. Topical: Gentamicin 3 x Per Week/30 Days Discharge Instructions: Apply thin layer to wound bed under iodosorb with dressing changes Prim Dressing: Iodosorb Gel 10 (gm) Tube (Dispense As Written) 3 x Per Week/30 Days ary Discharge Instructions: Apply to wound bed as instructed Secondary Dressing: Woven Gauze Sponge, Non-Sterile 4x4 in (Generic) 3 x Per Week/30 Days Discharge Instructions: Apply over primary dressing as directed. Secondary Dressing: ABD Pad,  5x9 3 x Per Week/30 Days Discharge Instructions: Apply over primary dressing as directed. Secured With: [employer], 4.5x3.1 (in/yd) (Generic) 3 x Per Week/30 Days Discharge Instructions: Secure with Kerlix as directed. Secured With: Paper T ape, 2x10 (in/yd) (Generic) 3 x Per Week/30 Days Discharge Instructions: Secure dressing with tape as directed. 1. At this point I do not have any reason to refute the history the patient is stating although I do not know that that would have anything to do with being on ECMO although I will certainly research this 2. Mirror-image wounds. I agree with the Iodoflex to help with ongoing debridement. 3. I saw no evidence of infection although he is complaining of some pain in the right foot 4. He is using his feet to push his wheelchair along and I asked him not to do that in case he is putting excessive pressure over the wound areas. 5. I did not change his dressings Electronic Signature(s) Signed: 07/28/2020 5:57:42 PM By: Linton Ham MD Entered By: Linton Ham on 07/28/2020 16:35:22 -------------------------------------------------------------------------------- SuperBill Details Patient Name: Date of Service: [name], [place] 07/28/2020 Medical Record Number: [id number] Patient Account Number: [id number] Date of Birth/Sex: Treating RN: 1976/03/24 (44 y.o. [name], [name] Primary Care Provider: [name] Other Clinician: Referring Provider: Treating Provider/Extender: [name] in Treatment: 1 Diagnosis Coding ICD-10 Codes Code Description [phone] Pressure ulcer of other site, stage 3 M62.81 Muscle weakness (generalized) I10 Essential (primary) hypertension Facility Procedures CPT4 Code: [id number] Description: 69678 - DEB SUBQ TISSUE 20 SQ CM/< ICD-10 Diagnosis Description L89.893 Pressure ulcer of other site, stage 3 Modifier: Quantity: 1 CPT4 Code: [id number] Description: 10258 - DEB SUBQ TISS  EA ADDL 20CM ICD-10 Diagnosis Description L89.893 Pressure ulcer of other site, stage 3 Modifier: Quantity: 2 Physician Procedures : CPT4 Code Description Modifier [id number] 23536 - WC PHYS SUBQ TISS 20 SQ CM ICD-10 Diagnosis Description L89.893 Pressure ulcer of other site, stage 3 Quantity: 1 : [id number] 00867 - WC PHYS SUBQ TISS EA ADDL 20 CM ICD-10 Diagnosis Description L89.893 Pressure ulcer of other site, stage 3 Quantity: 2 Electronic Signature(s) Signed: 07/28/2020 5:57:42 PM By: Linton Ham MD Entered By: Linton Ham on 07/28/2020 16:35:44

## 2020-07-29 NOTE — Progress Notes (Signed)
[name], [name] ([id number]) Visit Report for 07/28/2020 Arrival Information Details Patient Name: Date of Service: [name], [name] E. 07/28/2020 2:45 PM Medical Record Number: [id number] Patient Account Number: [id number] Date of Birth/Sex: Treating [name]: [birth year] (44 y.o. [name]) Dolores Lory, Morey Hummingbird Primary Care [name]: [name] Other Clinician: Referring [name]: Treating [name]: [name] in Treatment: 1 Visit Information History Since Last Visit All ordered tests and consults were completed: No Patient Arrived: Wheel Chair Added or deleted any medications: No Arrival Time: 15:08 Any new allergies or adverse reactions: No Accompanied By: son Had a fall or experienced change in No Transfer Assistance: None activities of daily living that may affect Patient Identification Verified: Yes risk of falls: Secondary Verification Process Completed: Yes Signs or symptoms of abuse/neglect since last visito No Patient Requires Transmission-Based Precautions: No Hospitalized since last visit: No Patient Has Alerts: No Implantable device outside of the clinic excluding No cellular tissue based products placed in the center since last visit: Has Dressing in Place as Prescribed: Yes Pain Present Now: Yes Electronic Signature(s) Signed: 07/29/2020 5:14:06 PM By: [name] Entered By: [name] on 07/28/2020 15:09:21 -------------------------------------------------------------------------------- Encounter Discharge Information Details Patient Name: Date of Service: [name], [place]. 07/28/2020 2:45 PM Medical Record Number: [id number] Patient Account Number: [id number] Date of Birth/Sex: Treating [name]: 1976/04/05 (44 y.o. [name] Primary Care Caelyn Route: [name] Other Clinician: Referring Kalei Meda: Treating [name]: [name] in Treatment: 1 Encounter Discharge Information Items Post Procedure  Vitals Discharge Condition: Stable Temperature (F): 98.3 Ambulatory Status: Wheelchair Pulse (bpm): 87 Discharge Destination: Home Respiratory Rate (breaths/min): 18 Transportation: Private Auto Blood Pressure (mmHg): 153/85 Accompanied By: son Schedule Follow-up Appointment: Yes Clinical Summary of Care: Electronic Signature(s) Signed: 07/28/2020 5:55:39 PM By: [name] Entered By: [name] on 07/28/2020 17:02:22 -------------------------------------------------------------------------------- Lower Extremity Assessment Details Patient Name: Date of Service: [name], [name] 07/28/2020 2:45 PM Medical Record Number: [id number] Patient Account Number: [id number] Date of Birth/Sex: Treating [name]: [birth year] (44 y.o. [name] Primary Care Pr[place]: [name] Other Clinician: Referring Netta Fodge: Treating [name]: [name] in Treatment: 1 Edema Assessment Assessed: [name]: No] [name]: No] Edema: [Left: Yes] [Right: Yes] Calf Left: Right: Point of Measurement: 34 cm From Medial Instep 41.2 cm 40.5 cm Ankle Left: Right: Point of Measurement: 12 cm From Medial Instep 25 cm 24.5 cm Electronic Signature(s) Signed: 07/29/2020 5:14:06 PM By: [name] Entered By: [name] on 07/28/2020 15:11:10 -------------------------------------------------------------------------------- Multi Wound Chart Details Patient Name: Date of Service: [name], [place] 07/28/2020 2:45 PM Medical Record Number: [id number] Patient Account Number: [id number] Date of Birth/Sex: Treating [name]: 09-Aug-1975 (44 y.o. Burnadette [name], [name] Primary Care [place] Mokry: [name] Other Clinician: Referring Kavonte Bearse: Treating [name]/Extender: [name] in Treatment: 1 Vital Signs Height(in): 69 Pulse(bpm): 80 Weight(lbs): 280 Blood Pressure(mmHg): 153/85 Body Mass Index(BMI): 41 Temperature(F): 98.3 Respiratory  Rate(breaths/min): 18 Photos: [1:No Photos Right Calcaneus] [2:No Photos Left Calcaneus] [N/A:N/A N/A] Wound Location: [1:Pressure Injury] [2:Pressure Injury] [N/A:N/A] Wounding Event: [1:Pressure Ulcer] [2:Pressure Ulcer] [N/A:N/A] Primary Etiology: [1:Asthma, Angina, Hypertension] [2:Asthma, Angina, Hypertension] [N/A:N/A] Comorbid History: [1:10/07/2019] [2:10/07/2019] [N/A:N/A] Date Acquired: [1:1] [2:1] [N/A:N/A] Weeks of Treatment: [1:Open] [2:Open] [N/A:N/A] Wound Status: [1:8.2x2.8x0.5] [2:8x2.9x0.8] [N/A:N/A] Measurements L x W x D (cm) [1:18.033] [2:18.221] [N/A:N/A] A (cm) : rea [1:9.016] [2:14.577] [N/A:N/A] Volume (cm) : [1:43.00%] [2:32.80%] [N/A:N/A] % Reduction in A rea: [1:71.50%] [2:46.20%] [N/A:N/A] % Reduction in Volume: [1:Category/Stage III] [  2:Category/Stage III] [N/A:N/A] Classification: [1:Medium] [2:Medium] [N/A:N/A] Exudate A mount: [1:Serosanguineous] [2:Serosanguineous] [N/A:N/A] Exudate Type: [1:red, brown] [2:red, brown] [N/A:N/A] Exudate Color: [1:Distinct, outline attached] [2:Distinct, outline attached] [N/A:N/A] Wound Margin: [1:Small (1-33%)] [2:Small (1-33%)] [N/A:N/A] Granulation A mount: [1:Red] [2:Red] [N/A:N/A] Granulation Quality: [1:Large (67-100%)] [2:Large (67-100%)] [N/A:N/A] Necrotic Amount: [1:Fat Layer (Subcutaneous Tissue): Yes Fat Layer (Subcutaneous Tissue): Yes N/A] Exposed Structures: [1:Fascia: No Tendon: No Muscle: No Joint: No Bone: No Small (1-33%)] [2:Fascia: No Tendon: No Muscle: No Joint: No Bone: No None] [N/A:N/A] Epithelialization: [1:Debridement - Excisional] [2:Debridement - Excisional] [N/A:N/A] Debridement: Pre-procedure Verification/Time Out 16:05 [2:16:03] [N/A:N/A] Taken: [1:Other] [2:Other] [N/A:N/A] Pain Control: [1:Subcutaneous, Slough] [2:Subcutaneous, Slough] [N/A:N/A] Tissue Debrided: [1:Skin/Subcutaneous Tissue] [2:Skin/Subcutaneous Tissue] [N/A:N/A] Level: [1:22.96] [2:23.2] [N/A:N/A] Debridement A (sq cm):  [1:rea Curette] [2:Curette] [N/A:N/A] Instrument: [1:Minimum] [2:Minimum] [N/A:N/A] Bleeding: [1:Pressure] [2:Pressure] [N/A:N/A] Hemostasis A chieved: [1:0] [2:0] [N/A:N/A] Procedural Pain: [1:0] [2:0] [N/A:N/A] Post Procedural Pain: [1:Procedure was tolerated well] [2:Procedure was tolerated well] [N/A:N/A] Debridement Treatment Response: [1:8.2x2.8x0.5] [2:8x2.9x0.8] [N/A:N/A] Post Debridement Measurements L x W x D (cm) [1:9.016] [2:14.577] [N/A:N/A] Post Debridement Volume: (cm) [1:Category/Stage III] [2:Category/Stage III] [N/A:N/A] Post Debridement Stage: [1:Debridement] [2:Debridement] [N/A:N/A] Treatment Notes Electronic Signature(s) Signed: 07/28/2020 5:57:42 PM By: Linton Ham MD Signed: 07/29/2020 5:16:00 PM By: [name] [name] Entered By: Linton Ham on 07/28/2020 16:27:56 -------------------------------------------------------------------------------- Multi-Disciplinary Care Plan Details Patient Name: Date of Service: [name], [place]. 07/28/2020 2:45 PM Medical Record Number: [id number] Patient Account Number: [id number] Date of Birth/Sex: Treating [name]: June 27, 1976 (44 y.o. Burnadette [name], [name] Primary Care Nyree Yonker: [name] Other Clinician: Referring Kelijah Towry: Treating Lonzie Simmer/Extender: [name] in Treatment: 1 Active Inactive Abuse / Safety / Falls / Self Care Management Nursing Diagnoses: Potential for falls Goals: Patient/caregiver will verbalize/demonstrate measures taken to prevent injury and/or falls Date Initiated: 07/15/2020 Target Resolution Date: 08/31/2020 Goal Status: Active Interventions: Assess fall risk on admission and as needed Assess impairment of mobility on admission and as needed per policy Notes: Pressure Nursing Diagnoses: Knowledge deficit related to management of pressures ulcers Potential for impaired tissue integrity related to pressure, friction, moisture, and  shear Goals: Patient/caregiver will verbalize understanding of pressure ulcer management Date Initiated: 07/15/2020 Target Resolution Date: 09/01/2020 Goal Status: Active Interventions: Assess: immobility, friction, shearing, incontinence upon admission and as needed Assess potential for pressure ulcer upon admission and as needed Notes: Wound/Skin Impairment Nursing Diagnoses: Impaired tissue integrity Knowledge deficit related to ulceration/compromised skin integrity Goals: Patient/caregiver will verbalize understanding of skin care regimen Date Initiated: 07/15/2020 Target Resolution Date: 09/02/2020 Goal Status: Active Ulcer/skin breakdown will have a volume reduction of 30% by week 4 Date Initiated: 07/15/2020 Target Resolution Date: 09/03/2020 Goal Status: Active Interventions: Assess patient/caregiver ability to obtain necessary supplies Assess patient/caregiver ability to perform ulcer/skin care regimen upon admission and as needed Assess ulceration(s) every visit Provide education on ulcer and skin care Treatment Activities: Skin care regimen initiated : 07/15/2020 Topical wound management initiated : 07/15/2020 Notes: Electronic Signature(s) Signed: 07/29/2020 5:16:00 PM By: [name] [name] Entered By: [name] on 07/28/2020 16:09:49 -------------------------------------------------------------------------------- Pain Assessment Details Patient Name: Date of Service: [name], [place]. 07/28/2020 2:45 PM Medical Record Number: [id number] Patient Account Number: [id number] Date of Birth/Sex: Treating [name]: 02-08-1976 (44 y.o. [name] Primary Care Yuette Putnam: [name] Other Clinician: Referring Aarti Mankowski: Treating Maddoxx Burkitt/Extender: [name] in Treatment: 1 Active Problems Location of Pain Severity and Description of Pain Patient Has Paino No Site Locations Pain Management and Medication Current Pain  Management: Electronic Signature(s) Signed: 07/29/2020 5:14:06 PM By: [name] Entered By: [name] on 07/28/2020 15:10:02 -------------------------------------------------------------------------------- Patient/Caregiver Education Details Patient Name: Date of Service: [name], [place] 12/21/2021andnbsp2:45 PM Medical Record Number: [id number] Patient Account Number: [id number] Date of Birth/Gender: Treating [name]: 1975/09/28 (44 y.o. Burnadette [name], [place] Primary Care Physician: [name] Other Clinician: Referring Physician: Treating Physician/Extender: [name] in Treatment: 1 Education Assessment Education Provided To: Patient and Caregiver Education Topics Provided Basic Hygiene: Methods: Explain/Verbal Responses: Reinforcements needed Wound/Skin Impairment: Responses: Reinforcements needed Electronic Signature(s) Signed: 07/29/2020 5:16:00 PM By: [name] [name] Entered By: [name] on 07/28/2020 15:57:16 -------------------------------------------------------------------------------- Wound Assessment Details Patient Name: Date of Service: [name], [place] E. 07/28/2020 2:45 PM Medical Record Number: [id number] Patient Account Number: [id number] Date of Birth/Sex: Treating [name]: 11-17-1975 (44 y.o. [name]) [name] Primary Care [place] Dann: Other Clinician: Cristie [name] Referring Deziyah Arvin: Treating Cozetta Seif/Extender: [name] in Treatment: 1 Wound Status Wound Number: 1 Primary Etiology: Pressure Ulcer Wound Location: Right Calcaneus Wound Status: Open Wounding Event: Pressure Injury Comorbid History: Asthma, Angina, Hypertension Date Acquired: 10/07/2019 Weeks Of Treatment: 1 Clustered Wound: No Wound Measurements Length: (cm) 8.2 Width: (cm) 2.8 Depth: (cm) 0.5 Area: (cm) 18.033 Volume: (cm) 9.016 % Reduction in Area: 43% % Reduction in Volume: 71.5% Epithelialization: Small  (1-33%) Tunneling: No Undermining: No Wound Description Classification: Category/Stage III Wound Margin: Distinct, outline attached Exudate Amount: Medium Exudate Type: Serosanguineous Exudate Color: red, brown Foul Odor After Cleansing: No Slough/Fibrino Yes Wound Bed Granulation Amount: Small (1-33%) Exposed Structure Granulation Quality: Red Fascia Exposed: No Necrotic Amount: Large (67-100%) Fat Layer (Subcutaneous Tissue) Exposed: Yes Necrotic Quality: Adherent Slough Tendon Exposed: No Muscle Exposed: No Joint Exposed: No Bone Exposed: No Treatment Notes Wound #1 (Calcaneus) Wound Laterality: Right Cleanser Soap and Water Discharge Instruction: May shower and wash wound with dial antibacterial soap and water prior to dressing change. Peri-Wound Care Topical Gentamicin Discharge Instruction: Apply thin layer to wound bed under iodosorb with dressing changes Primary Dressing Iodosorb Gel 10 (gm) Tube Discharge Instruction: Apply to wound bed as instructed Secondary Dressing Woven Gauze Sponge, Non-Sterile 4x4 in Discharge Instruction: Apply over primary dressing as directed. ABD Pad, 5x9 Discharge Instruction: Apply over primary dressing as directed. Secured With [employer], 4.5x3.1 (in/yd) Discharge Instruction: Secure with Kerlix as directed. Paper Tape, 2x10 (in/yd) Discharge Instruction: Secure dressing with tape as directed. Compression Wrap Compression Stockings Add-Ons Electronic Signature(s) Signed: 07/29/2020 5:14:06 PM By: [name] Entered By: [name] on 07/28/2020 15:14:01 -------------------------------------------------------------------------------- Wound Assessment Details Patient Name: Date of Service: [name], [place] E. 07/28/2020 2:45 PM Medical Record Number: [id number] Patient Account Number: [id number] Date of Birth/Sex: Treating [name]: 02-12-1976 (44 y.o. [name]) [name] Primary Care Elner Seifert: [name] Other  Clinician: Referring Yilin Weedon: Treating Neylan Koroma/Extender: [name] in Treatment: 1 Wound Status Wound Number: 2 Primary Etiology: Pressure Ulcer Wound Location: Left Calcaneus Wound Status: Open Wounding Event: Pressure Injury Comorbid History: Asthma, Angina, Hypertension Date Acquired: 10/07/2019 Weeks Of Treatment: 1 Clustered Wound: No Wound Measurements Length: (cm) 8 Width: (cm) 2.9 Depth: (cm) 0.8 Area: (cm) 18.221 Volume: (cm) 14.577 % Reduction in Area: 32.8% % Reduction in Volume: 46.2% Epithelialization: None Tunneling: No Undermining: No Wound Description Classification: Category/Stage III Wound Margin: Distinct, outline attached Exudate Amount: Medium Exudate Type: Serosanguineous Exudate Color: red, brown Foul Odor After Cleansing: No Slough/Fibrino Yes Wound Bed Granulation Amount: Small (1-33%) Exposed Structure Granulation  Quality: Red Fascia Exposed: No Necrotic Amount: Large (67-100%) Fat Layer (Subcutaneous Tissue) Exposed: Yes Necrotic Quality: Adherent Slough Tendon Exposed: No Muscle Exposed: No Joint Exposed: No Bone Exposed: No Treatment Notes Wound #2 (Calcaneus) Wound Laterality: Left Cleanser Soap and Water Discharge Instruction: May shower and wash wound with dial antibacterial soap and water prior to dressing change. Peri-Wound Care Topical Gentamicin Discharge Instruction: Apply thin layer to wound bed under iodosorb with dressing changes Primary Dressing Iodosorb Gel 10 (gm) Tube Discharge Instruction: Apply to wound bed as instructed Secondary Dressing Woven Gauze Sponge, Non-Sterile 4x4 in Discharge Instruction: Apply over primary dressing as directed. ABD Pad, 5x9 Discharge Instruction: Apply over primary dressing as directed. Secured With [employer], 4.5x3.1 (in/yd) Discharge Instruction: Secure with Kerlix as directed. Paper Tape, 2x10 (in/yd) Discharge Instruction: Secure  dressing with tape as directed. Compression Wrap Compression Stockings Add-Ons Electronic Signature(s) Signed: 07/29/2020 5:14:06 PM By: [name] Entered By: [name] on 07/28/2020 15:14:10 -------------------------------------------------------------------------------- Vitals Details Patient Name: Date of Service: [name], [place] E. 07/28/2020 2:45 PM Medical Record Number: [id number] Patient Account Number: [id number] Date of Birth/Sex: Treating [name]: 12-Oct-1975 (44 y.o. [name]) [name] Primary Care Reuben Knoblock: [name] Other Clinician: Referring Mehtaab Mayeda: Treating Arlita Buffkin/Extender: [name] in Treatment: 1 Vital Signs Time Taken: 15:09 Temperature (F): 98.3 Height (in): 69 Pulse (bpm): 87 Weight (lbs): 280 Respiratory Rate (breaths/min): 18 Body Mass Index (BMI): 41.3 Blood Pressure (mmHg): 153/85 Reference Range: 80 - 120 mg / dl Electronic Signature(s) Signed: 07/29/2020 5:14:06 PM By: [name] Entered By: [name] on 07/28/2020 15:09:52

## 2020-08-05 ENCOUNTER — Other Ambulatory Visit: Payer: Self-pay | Admitting: Allergy & Immunology

## 2020-08-13 ENCOUNTER — Ambulatory Visit (HOSPITAL_COMMUNITY)
Admission: RE | Admit: 2020-08-13 | Discharge: 2020-08-13 | Disposition: A | Discharge status: Home / Self Care | Payer: BC Managed Care – PPO | Source: Ambulatory Visit | Attending: Internal Medicine | Admitting: Internal Medicine

## 2020-08-13 ENCOUNTER — Encounter (HOSPITAL_BASED_OUTPATIENT_CLINIC_OR_DEPARTMENT_OTHER): Payer: BC Managed Care – PPO | Attending: Internal Medicine | Admitting: Internal Medicine

## 2020-08-13 ENCOUNTER — Other Ambulatory Visit (HOSPITAL_COMMUNITY): Payer: Self-pay | Admitting: Internal Medicine

## 2020-08-13 ENCOUNTER — Other Ambulatory Visit: Payer: Self-pay

## 2020-08-13 DIAGNOSIS — I10 Essential (primary) hypertension: Secondary | ICD-10-CM | POA: Diagnosis not present

## 2020-08-13 DIAGNOSIS — Z8616 Personal history of COVID-19: Secondary | ICD-10-CM | POA: Insufficient documentation

## 2020-08-13 DIAGNOSIS — R7309 Other abnormal glucose: Secondary | ICD-10-CM | POA: Diagnosis not present

## 2020-08-13 DIAGNOSIS — S91301A Unspecified open wound, right foot, initial encounter: Secondary | ICD-10-CM

## 2020-08-13 DIAGNOSIS — M7731 Calcaneal spur, right foot: Secondary | ICD-10-CM | POA: Diagnosis not present

## 2020-08-13 DIAGNOSIS — M6281 Muscle weakness (generalized): Secondary | ICD-10-CM | POA: Diagnosis not present

## 2020-08-13 DIAGNOSIS — L89893 Pressure ulcer of other site, stage 3: Secondary | ICD-10-CM | POA: Insufficient documentation

## 2020-08-13 DIAGNOSIS — F329 Major depressive disorder, single episode, unspecified: Secondary | ICD-10-CM | POA: Diagnosis not present

## 2020-08-13 NOTE — Progress Notes (Signed)
MESSI, TWEDT ([id number]) Visit Report for 08/13/2020 Debridement Details Patient Name: Date of Service: [name], [place] 08/13/2020 2:00 PM Medical Record Number: [id number] Patient Account Number: [id number] Date of Birth/Sex: Treating RN: 1975/12/28 (45 y.o. [name] Primary Care Provider: [name] Other Clinician: Referring Provider: Treating Provider/Extender: [name] in Treatment: 4 Debridement Performed for Assessment: Wound #1 Right Calcaneus Performed By: Physician Ricard Dillon., MD Debridement Type: Debridement Level of Consciousness (Pre-procedure): Awake and Alert Pre-procedure Verification/Time Out Yes - 14:48 Taken: Start Time: 14:49 Pain Control: Lidocaine 4% T opical Solution T Area Debrided (L x W): otal 8 (cm) x 2.7 (cm) = 21.6 (cm) Tissue and other material debrided: Viable, Non-Viable, Slough, Subcutaneous, Skin: Dermis , Skin: Epidermis, Fibrin/Exudate, Slough Level: Skin/Subcutaneous Tissue Debridement Description: Excisional Instrument: Curette Bleeding: Minimum Hemostasis Achieved: Pressure End Time: 14:52 Procedural Pain: 1 Post Procedural Pain: 4 Response to Treatment: Procedure was tolerated well Level of Consciousness (Post- Awake and Alert procedure): Post Debridement Measurements of Total Wound Length: (cm) 8 Stage: Category/Stage III Width: (cm) 2.7 Depth: (cm) 0.5 Volume: (cm) 8.482 Character of Wound/Ulcer Post Debridement: Improved Post Procedure Diagnosis Same as Pre-procedure Electronic Signature(s) Signed: 08/13/2020 5:18:36 PM By: Linton Ham MD Signed: 08/13/2020 6:28:52 PM By: [name] Entered By: [name] on 08/13/2020 14:52:59 -------------------------------------------------------------------------------- Debridement Details Patient Name: Date of Service: [name], [name] 08/13/2020 2:00 PM Medical Record Number: [id number] Patient Account Number: [id number] Date of  Birth/Sex: Treating RN: [birth year] (45 y.o. [name] Primary Care Provider: [name] Other Clinician: Referring Provider: Treating Provider/Extender: [name] in Treatment: 4 Debridement Performed for Assessment: Wound #2 Left Calcaneus Performed By: Physician Ricard Dillon., MD Debridement Type: Debridement Level of Consciousness (Pre-procedure): Awake and Alert Pre-procedure Verification/Time Out Yes - 14:48 Taken: Start Time: 14:49 Pain Control: Lidocaine 4% T opical Solution T Area Debrided (L x W): otal 8.1 (cm) x 2.5 (cm) = 20.25 (cm) Tissue and other material debrided: Viable, Non-Viable, Slough, Subcutaneous, Skin: Dermis , Skin: Epidermis, Fibrin/Exudate, Slough Level: Skin/Subcutaneous Tissue Debridement Description: Excisional Instrument: Curette Bleeding: Minimum Hemostasis Achieved: Pressure End Time: 14:52 Procedural Pain: 1 Post Procedural Pain: 4 Response to Treatment: Procedure was tolerated well Level of Consciousness (Post- Awake and Alert procedure): Post Debridement Measurements of Total Wound Length: (cm) 8.1 Stage: Category/Stage III Width: (cm) 2.5 Depth: (cm) 0.5 Volume: (cm) 7.952 Character of Wound/Ulcer Post Debridement: Improved Post Procedure Diagnosis Same as Pre-procedure Electronic Signature(s) Signed: 08/13/2020 5:18:36 PM By: Linton Ham MD Signed: 08/13/2020 6:28:52 PM By: [name] Entered By: Linton Ham on 08/13/2020 15:07:25 -------------------------------------------------------------------------------- HPI Details Patient Name: Date of Service: [name], [name] 08/13/2020 2:00 PM Medical Record Number: [id number] Patient Account Number: [id number] Date of Birth/Sex: Treating RN: 08-Jun-1976 (45 y.o. [name] Primary Care Provider: [name] Other Clinician: Referring Provider: Treating Provider/Extender: [name] in Treatment: 4 History of  Present Illness HPI Description: 07/15/2020 upon evaluation today patient presents for initial inspection here in our clinic concerning issues he has been having with the bottoms of his feet bilaterally. He states these actually occurred as wounds when he was hospitalized for 5 months secondary to Covid. He was apparently with tilting bed where he was in an upright position quite frequently and apparently this occurred in some way shape or form during that time. Fortunately there is no sign of active infection at this time. No fevers, chills, nausea, vomiting,  or diarrhea. With that being said he still has substantial wounds on the plantar aspects of his feet Theragen require quite a bit of work to get these to heal. He has been using Santyl currently though that is been problematic both in receiving the medication as well as actually paid for it as it is become quite expensive. Prior to the experience with Covid the patient really did not have any major medical problems other than hypertension he does have some mild generalized weakness following the Covid experience. 07/22/2020 on evaluation today patient appears to be doing okay in regard to his foot ulcers I feel like the wound beds are showing signs of better improvement that I do believe the Iodoflex is helping in this regard. With that being said he does have a lot of drainage currently and this is somewhat blue/green in nature which is consistent with Pseudomonas. I do think a culture today would be appropriate for [place] to evaluate and see if that is indeed the case I would likely start him on antibiotic orally as well he is not allergic to Cipro knows of no issues he has had in the past 12/21; patient was admitted to the clinic earlier this month with bilateral presumed pressure ulcers on the bottom of his feet apparently related to excessive pressure from a tilt table arrangement in the intensive care unit. Patient relates this to being on ECMO  but I am not really sure that is exactly related to that. I must say I have never seen anything like this. He has fairly extensive full-thickness wounds extending from his heel towards his midfoot mostly centered laterally. There is already been some healing distally. He does not appear to have an arterial issue. He has been using gentamicin to the wound surfaces with Iodoflex to help with ongoing debridement 1/6; this is a patient with pressure ulcers on the bottom of his feet related to excessive pressure from a standing position in the intensive care unit. He is complaining of a lot of pain in the right heel. He is not a diabetic. 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 We have been using Iodoflex to help prepare the surfaces of both wounds for an advanced treatment product. He is nonambulatory spending most of his time in a wheelchair I have asked him not to propel the wheelchair with his heels Electronic Signature(s) Signed: 08/13/2020 5:18:36 PM By: Linton Ham MD Entered By: Linton Ham on 08/13/2020 15:08:38 -------------------------------------------------------------------------------- Physical Exam Details Patient Name: Date of Service: [name], [place]. 08/13/2020 2:00 PM Medical Record Number: [id number] Patient Account Number: [id number] Date of Birth/Sex: Treating RN: 07-05-1976 (45 y.o. [name] Primary Care Provider: [name] Other Clinician: Referring Provider: Treating Provider/Extender: [name] in Treatment: 4 Constitutional Patient is hypertensive.. Pulse regular and within target range for patient.Marland Kitchen Respirations regular, non-labored and within target range.. Temperature is normal and within the target range for the patient.Marland Kitchen Appears in no distress. Cardiovascular Pedal pulses palpable and strong bilaterally.. Notes Wound exam; extensive areas on both heels. We have had healing on a large part of what  was open but there is still teardrop shaped wounds on both plantar heels lateral aspect. We have been using Iodoflex to debride these areas mechanical debridement when he is here and were gradually making progress. Although he complains of a lot of pain in the heel on the right I cannot really determine what the exact cause of this that  nothing here looks infected there is no palpable tenderness around the wounds. He does not have any arterial issues Electronic Signature(s) Signed: 08/13/2020 5:18:36 PM By: Linton Ham MD Entered By: Linton Ham on 08/13/2020 15:10:14 -------------------------------------------------------------------------------- Physician Orders Details Patient Name: Date of Service: [name], [place]. 08/13/2020 2:00 PM Medical Record Number: [id number] Patient Account Number: [id number] Date of Birth/Sex: Treating RN: 11/03/1975 (45 y.o. [name] Primary Care Provider: [name] Other Clinician: Referring Provider: Treating Provider/Extender: [name] in Treatment: 4 Verbal / Phone Orders: No Diagnosis Coding ICD-10 Coding Code Description [phone] Pressure ulcer of other site, stage 3 M62.81 Muscle weakness (generalized) I10 Essential (primary) hypertension Follow-up Appointments Return Appointment in 1 week. Cellular or Tissue Based Products Wound #1 Right Calcaneus Cellular or Tissue Based Product Type: - wound center to run insurance approval for [employer]. Wound #2 Left Calcaneus Cellular or Tissue Based Product Type: - wound center to run insurance approval for [employer]. Bathing/ Shower/ Hygiene May shower and wash wound with soap and water. - wash wounds with dial antibacterial soap Edema Control - Lymphedema / SCD / Other Bilateral Lower Extremities Elevate legs to the level of the heart or above for 30 minutes daily and/or when sitting, a frequency of: Avoid standing for long periods of  time. Off-Loading Other: - keep pressure off of the bottom of your feet Wound Treatment Wound #1 - Calcaneus Wound Laterality: Right Cleanser: Soap and Water 3 x Per Week/30 Days Discharge Instructions: May shower and wash wound with dial antibacterial soap and water prior to dressing change. Prim Dressing: Iodosorb Gel 10 (gm) Tube (DME) (Generic) 3 x Per Week/30 Days ary Discharge Instructions: Apply to wound bed as instructed Secondary Dressing: Woven Gauze Sponge, Non-Sterile 4x4 in (Generic) 3 x Per Week/30 Days Discharge Instructions: Apply over primary dressing as directed. Secondary Dressing: ABD Pad, 5x9 3 x Per Week/30 Days Discharge Instructions: Apply over primary dressing as directed. Secured With: [employer], 4.5x3.1 (in/yd) (DME) (Generic) 3 x Per Week/30 Days Discharge Instructions: Secure with Kerlix as directed. Secured With: Paper Tape, 2x10 (in/yd) (DME) (Generic) 3 x Per Week/30 Days Discharge Instructions: Secure dressing with tape as directed. Wound #2 - Calcaneus Wound Laterality: Left Cleanser: Soap and Water 3 x Per Week/30 Days Discharge Instructions: May shower and wash wound with dial antibacterial soap and water prior to dressing change. Prim Dressing: Iodosorb Gel 10 (gm) Tube (DME) (Generic) 3 x Per Week/30 Days ary Discharge Instructions: Apply to wound bed as instructed Secondary Dressing: Woven Gauze Sponge, Non-Sterile 4x4 in (DME) (Generic) 3 x Per Week/30 Days Discharge Instructions: Apply over primary dressing as directed. Secondary Dressing: ABD Pad, 5x9 (DME) 3 x Per Week/30 Days Discharge Instructions: Apply over primary dressing as directed. Secured With: [employer], 4.5x3.1 (in/yd) (DME) (Generic) 3 x Per Week/30 Days Discharge Instructions: Secure with Kerlix as directed. Secured With: Paper Tape, 2x10 (in/yd) (DME) (Generic) 3 x Per Week/30 Days Discharge Instructions: Secure dressing with tape as  directed. Radiology X-ray, heel right - x-ray right heel related to non-healing ulcer looking for infection. CPT code - (ICD10 [phone] - Pressure ulcer of other site, stage 3) Electronic Signature(s) Signed: 08/13/2020 5:18:36 PM By: Linton Ham MD Signed: 08/13/2020 6:28:52 PM By: [name] Entered By: [name] on 08/13/2020 15:01:28 Prescription 08/13/2020 -------------------------------------------------------------------------------- Shirlean Kelly MD Patient Name: Provider: 1975-08-20 [id number] Date of Birth: NPI#: [id number] Sex: DEA #: [phone] [id number] Phone #:  License #: [place] Patient Address: [address] [place], [place] [address] [place], [place] [address] [phone] Allergies No Known Drug Allergies Provider's Orders X-ray, heel right - ICD10: L89.893 - x-ray right heel related to non-healing ulcer looking for infection. CPT code Hand Signature: Date(s): Electronic Signature(s) Signed: 08/13/2020 5:18:36 PM By: Linton Ham MD Signed: 08/13/2020 6:28:52 PM By: [name] Entered By: [name] on 08/13/2020 15:01:36 -------------------------------------------------------------------------------- Problem List Details Patient Name: Date of Service: [name], [place]. 08/13/2020 2:00 PM Medical Record Number: [id number] Patient Account Number: [id number] Date of Birth/Sex: Treating RN: 05/10/1976 (45 y.o. [name] Primary Care Provider: [name] Other Clinician: Referring Provider: Treating Provider/Extender: [name] in Treatment: 4 Active Problems ICD-10 Encounter Code Description Active Date MDM Diagnosis [phone] Pressure ulcer of other site, stage 3 07/15/2020 No Yes M62.81 Muscle weakness (generalized) 07/15/2020 No Yes I10 Essential (primary) hypertension 07/15/2020 No Yes Inactive Problems Resolved  Problems Electronic Signature(s) Signed: 08/13/2020 5:18:36 PM By: Linton Ham MD Entered By: Linton Ham on 08/13/2020 15:06:39 -------------------------------------------------------------------------------- Progress Note Details Patient Name: Date of Service: [name], [place] 08/13/2020 2:00 PM Medical Record Number: [id number] Patient Account Number: [id number] Date of Birth/Sex: Treating RN: 03-19-1976 (45 y.o. [name] Primary Care Provider: [name] Other Clinician: Referring Provider: Treating Provider/Extender: [name] in Treatment: 4 Subjective History of Present Illness (HPI) 07/15/2020 upon evaluation today patient presents for initial inspection here in our clinic concerning issues he has been having with the bottoms of his feet bilaterally. He states these actually occurred as wounds when he was hospitalized for 5 months secondary to Covid. He was apparently with tilting bed where he was in an upright position quite frequently and apparently this occurred in some way shape or form during that time. Fortunately there is no sign of active infection at this time. No fevers, chills, nausea, vomiting, or diarrhea. With that being said he still has substantial wounds on the plantar aspects of his feet Theragen require quite a bit of work to get these to heal. He has been using Santyl currently though that is been problematic both in receiving the medication as well as actually paid for it as it is become quite expensive. Prior to the experience with Covid the patient really did not have any major medical problems other than hypertension he does have some mild generalized weakness following the Covid experience. 07/22/2020 on evaluation today patient appears to be doing okay in regard to his foot ulcers I feel like the wound beds are showing signs of better improvement that I do believe the Iodoflex is helping in this regard. With that being  said he does have a lot of drainage currently and this is somewhat blue/green in nature which is consistent with Pseudomonas. I do think a culture today would be appropriate for [place] to evaluate and see if that is indeed the case I would likely start him on antibiotic orally as well he is not allergic to Cipro knows of no issues he has had in the past 12/21; patient was admitted to the clinic earlier this month with bilateral presumed pressure ulcers on the bottom of his feet apparently related to excessive pressure from a tilt table arrangement in the intensive care unit. Patient relates this to being on ECMO but I am not really sure that is exactly related to that. I must  say I have never seen anything like this. He has fairly extensive full-thickness wounds extending from his heel towards his midfoot mostly centered laterally. There is already been some healing distally. He does not appear to have an arterial issue. He has been using gentamicin to the wound surfaces with Iodoflex to help with ongoing debridement 1/6; this is a patient with pressure ulcers on the bottom of his feet related to excessive pressure from a standing position in the intensive care unit. He is complaining of a lot of pain in the right heel. He is not a diabetic. 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 We have been using Iodoflex to help prepare the surfaces of both wounds for an advanced treatment product. He is nonambulatory spending most of his time in a wheelchair I have asked him not to propel the wheelchair with his heels Objective Constitutional Patient is hypertensive.. Pulse regular and within target range for patient.Marland Kitchen Respirations regular, non-labored and within target range.. Temperature is normal and within the target range for the patient.Marland Kitchen Appears in no distress. Vitals Time Taken: 2:22 PM, Height: 69 in, Weight: 280 lbs, BMI: 41.3, Temperature: 98.4 F, Pulse: 74 bpm, Respiratory  Rate: 18 breaths/min, Blood Pressure: 147/88 mmHg. Cardiovascular Pedal pulses palpable and strong bilaterally.. General Notes: Wound exam; extensive areas on both heels. We have had healing on a large part of what was open but there is still teardrop shaped wounds on both plantar heels lateral aspect. We have been using Iodoflex to debride these areas mechanical debridement when he is here and were gradually making progress. Although he complains of a lot of pain in the heel on the right I cannot really determine what the exact cause of this that nothing here looks infected there is no palpable tenderness around the wounds. He does not have any arterial issues Integumentary (Hair, Skin) Wound #1 status is Open. Original cause of wound was Pressure Injury. The wound is located on the Right Calcaneus. The wound measures 8cm length x 2.7cm width x 0.5cm depth; 16.965cm^2 area and 8.482cm^3 volume. There is Fat Layer (Subcutaneous Tissue) exposed. There is no tunneling or undermining noted. There is a medium amount of serosanguineous drainage noted. The wound margin is distinct with the outline attached to the wound base. There is medium (34-66%) pink granulation within the wound bed. There is a medium (34-66%) amount of necrotic tissue within the wound bed including Adherent Slough. Wound #2 status is Open. Original cause of wound was Pressure Injury. The wound is located on the Left Calcaneus. The wound measures 8.1cm length x 2.5cm width x 0.5cm depth; 15.904cm^2 area and 7.952cm^3 volume. There is Fat Layer (Subcutaneous Tissue) exposed. There is no tunneling or undermining noted. There is a medium amount of serosanguineous drainage noted. The wound margin is distinct with the outline attached to the wound base. There is medium (34-66%) pink granulation within the wound bed. There is a medium (34-66%) amount of necrotic tissue within the wound bed including Adherent Slough. Assessment Active  Problems ICD-10 Pressure ulcer of other site, stage 3 Muscle weakness (generalized) Essential (primary) hypertension Procedures Wound #1 Pre-procedure diagnosis of Wound #1 is a Pressure Ulcer located on the Right Calcaneus . There was a Excisional Skin/Subcutaneous Tissue Debridement with a total area of 21.6 sq cm performed by Ricard Dillon., MD. With the following instrument(s): Curette to remove Viable and Non-Viable tissue/material. Material removed includes Subcutaneous Tissue, Slough, Skin: Dermis, Skin: Epidermis, and Fibrin/Exudate after achieving  pain control using Lidocaine 4% Topical Solution. A time out was conducted at 14:48, prior to the start of the procedure. A Minimum amount of bleeding was controlled with Pressure. The procedure was tolerated well with a pain level of 1 throughout and a pain level of 4 following the procedure. Post Debridement Measurements: 8cm length x 2.7cm width x 0.5cm depth; 8.482cm^3 volume. Post debridement Stage noted as Category/Stage III. Character of Wound/Ulcer Post Debridement is improved. Post procedure Diagnosis Wound #1: Same as Pre-Procedure Wound #2 Pre-procedure diagnosis of Wound #2 is a Pressure Ulcer located on the Left Calcaneus . There was a Excisional Skin/Subcutaneous Tissue Debridement with a total area of 20.25 sq cm performed by Ricard Dillon., MD. With the following instrument(s): Curette to remove Viable and Non-Viable tissue/material. Material removed includes Subcutaneous Tissue, Slough, Skin: Dermis, Skin: Epidermis, and Fibrin/Exudate after achieving pain control using Lidocaine 4% Topical Solution. A time out was conducted at 14:48, prior to the start of the procedure. A Minimum amount of bleeding was controlled with Pressure. The procedure was tolerated well with a pain level of 1 throughout and a pain level of 4 following the procedure. Post Debridement Measurements: 8.1cm length x 2.5cm width x 0.5cm depth;  7.952cm^3 volume. Post debridement Stage noted as Category/Stage III. Character of Wound/Ulcer Post Debridement is improved. Post procedure Diagnosis Wound #2: Same as Pre-Procedure Plan Follow-up Appointments: Return Appointment in 1 week. Cellular or Tissue Based Products: Wound #1 Right Calcaneus: Cellular or Tissue Based Product Type: - wound center to run insurance approval for [employer]. Wound #2 Left Calcaneus: Cellular or Tissue Based Product Type: - wound center to run insurance approval for [employer]. Bathing/ Shower/ Hygiene: May shower and wash wound with soap and water. - wash wounds with dial antibacterial soap Edema Control - Lymphedema / SCD / Other: Elevate legs to the level of the heart or above for 30 minutes daily and/or when sitting, a frequency of: Avoid standing for long periods of time. Off-Loading: Other: - keep pressure off of the bottom of your feet Radiology ordered were: X-ray, heel right - x-ray right heel related to non-healing ulcer looking for infection. CPT code WOUND #1: - Calcaneus Wound Laterality: Right Cleanser: Soap and Water 3 x Per Week/30 Days Discharge Instructions: May shower and wash wound with dial antibacterial soap and water prior to dressing change. Prim Dressing: Iodosorb Gel 10 (gm) Tube (DME) (Generic) 3 x Per Week/30 Days ary Discharge Instructions: Apply to wound bed as instructed Secondary Dressing: Woven Gauze Sponge, Non-Sterile 4x4 in (Generic) 3 x Per Week/30 Days Discharge Instructions: Apply over primary dressing as directed. Secondary Dressing: ABD Pad, 5x9 3 x Per Week/30 Days Discharge Instructions: Apply over primary dressing as directed. Secured With: [employer], 4.5x3.1 (in/yd) (DME) (Generic) 3 x Per Week/30 Days Discharge Instructions: Secure with Kerlix as directed. Secured With: Paper T ape, 2x10 (in/yd) (DME) (Generic) 3 x Per Week/30 Days Discharge Instructions: Secure dressing with tape as  directed. WOUND #2: - Calcaneus Wound Laterality: Left Cleanser: Soap and Water 3 x Per Week/30 Days Discharge Instructions: May shower and wash wound with dial antibacterial soap and water prior to dressing change. Prim Dressing: Iodosorb Gel 10 (gm) Tube (DME) (Generic) 3 x Per Week/30 Days ary Discharge Instructions: Apply to wound bed as instructed Secondary Dressing: Woven Gauze Sponge, Non-Sterile 4x4 in (DME) (Generic) 3 x Per Week/30 Days Discharge Instructions: Apply over primary dressing as directed. Secondary Dressing: ABD Pad, 5x9 (DME) 3  x Per Week/30 Days Discharge Instructions: Apply over primary dressing as directed. Secured With: [employer], 4.5x3.1 (in/yd) (DME) (Generic) 3 x Per Week/30 Days Discharge Instructions: Secure with Kerlix as directed. Secured With: Paper T ape, 2x10 (in/yd) (DME) (Generic) 3 x Per Week/30 Days Discharge Instructions: Secure dressing with tape as directed. 1. Still the Iodoflex 2. I may be able to change to a collagen-based dressing next week 3. We had some discussion about an advanced treatment product Berry. He is not a diabetic therefore not eligible for Dermagraft which might have been my first choice. However the surface area of both wounds combined will be difficult to cover with a single product. 4. I am going to x-ray the right heel making sure that there is no underlying bony abnormality. I do not believe there is infection. He does not appear to have an arterial issue Electronic Signature(s) Signed: 08/13/2020 5:18:36 PM By: Linton Ham MD Entered By: Linton Ham on 08/13/2020 15:12:05 -------------------------------------------------------------------------------- SuperBill Details Patient Name: Date of Service: [name], [place]. 08/13/2020 Medical Record Number: [id number] Patient Account Number: [id number] Date of Birth/Sex: Treating RN: 09-May-1976 (45 y.o. [name] Primary Care Provider: [name] Other Clinician: Referring Provider: Treating Provider/Extender: [name] in Treatment: 4 Diagnosis Coding ICD-10 Codes Code Description 4122185145 Pressure ulcer of other site, stage 3 M62.81 Muscle weakness (generalized) I10 Essential (primary) hypertension Facility Procedures CPT4 Code: 44010272 1 Description: 5366 - DEB SUBQ TISSUE 20 SQ CM/< ICD-10 Diagnosis Description L89.893 Pressure ulcer of other site, stage 3 Modifier: Quantity: 1 CPT4 Code: 44034742 1 Description: 5956 - DEB SUBQ TISS EA ADDL 20CM ICD-10 Diagnosis Description L89.893 Pressure ulcer of other site, stage 3 Modifier: Quantity: 2 Physician Procedures : CPT4 Code Description Modifier [id number] 32951 - WC PHYS SUBQ TISS 20 SQ CM ICD-10 Diagnosis Description L89.893 Pressure ulcer of other site, stage 3 Quantity: 1 : [id number] 63016 - WC PHYS SUBQ TISS EA ADDL 20 CM ICD-10 Diagnosis Description L89.893 Pressure ulcer of other site, stage 3 Quantity: 2 Electronic Signature(s) Signed: 08/13/2020 5:18:36 PM By: Linton Ham MD Entered By: Linton Ham on 08/13/2020 15:12:25

## 2020-08-16 NOTE — CV Procedure (Incomplete)
ECMO INITIATION   Patient: [name], [birth year], 45 y.o. Location:   Date of Service:  08/16/2020     Time: 12:39 PM  Date of Admission: 07/22/2019 Admitting diagnosis: COVID-19  Ht: 5' 8"  (172.7 cm) Wt: 119.3 kg BSA: Body surface area is 2.39 meters squared.  Blood Type: O POS Allergies: No Known Allergies  Past medical history:  Past Medical History:  Diagnosis Date  . Anginal pain ([place])    with covid  . Anxiety   . Asthma   . Dyspnea   . GERD (gastroesophageal reflux disease)   . Headache   . History of kidney stones    LEFT URETERAL STONE  . HTN (hypertension)   . Pancreatitis 2018   GALLBALDDER SLUDGE CAUSED ISSUED RESOLVED  . Pneumonia 07/2019   covid   Past surgical history:  Past Surgical History:  Procedure Laterality Date  . CANNULATION FOR ECMO (EXTRACORPOREAL MEMBRANE OXYGENATION) N/A 08/28/2019   Procedure: CANNULATION FOR VV ECMO (EXTRACORPOREAL MEMBRANE OXYGENATION);  Surgeon: Prescott Gum, Collier Salina, MD;  Location: [place];  Service: Open Heart Surgery;  Laterality: N/A;  CRESCENT CANNULA  . CANNULATION FOR ECMO (EXTRACORPOREAL MEMBRANE OXYGENATION) N/A 09/10/2019   Procedure: CANNULATION FOR ECMO (EXTRACORPOREAL MEMBRANE OXYGENATION) PUTTING IN CRESCENT 32FR CANNULA  AND REMOVING GROING CANNULATION;  Surgeon: Wonda Olds, MD;  Location: [place];  Service: Open Heart Surgery;  Laterality: N/A;  PUTTING IN CRESCENT/REMOVING GROIN CANNULATION  . CYSTOSCOPY/URETEROSCOPY/HOLMIUM LASER/STENT PLACEMENT Left 04/18/2019   Procedure: LEFT URETEROSCOPY/HOLMIUM LASER/STENT PLACEMENT;  Surgeon: Ardis Hughs, MD;  Location: [facility];  Service: Urology;  Laterality: Left;  . CYSTOSCOPY/URETEROSCOPY/HOLMIUM LASER/STENT PLACEMENT Left 05/02/2019   Procedure: CYSTOSCOPY/URETEROSCOPY/HOLMIUM LASER/STENT EXCHANGE;  Surgeon: Ardis Hughs, MD;  Location: [facility];  Service: Urology;  Laterality: Left;  . ECMO CANNULATION N/A 08/03/2019   Procedure: ECMO  CANNULATION;  Surgeon: Wonda Olds, MD;  Location: [place] [facility];  Service: Cardiovascular;  Laterality: N/A;  . ESOPHAGOGASTRODUODENOSCOPY N/A 09/11/2019   Procedure: ESOPHAGOGASTRODUODENOSCOPY (EGD);  Surgeon: Wonda Olds, MD;  Location: [facility];  Service: Thoracic;  Laterality: N/A;  . GRAFT APPLICATION Bilateral 1/61/0960   Procedure: APPLICATION OF SKIN GRAFT BILATERAL FEET;  Surgeon: Edrick Kins, DPM;  Location: [facility];  Service: Podiatry;  Laterality: Bilateral;  . IR REPLC GASTRO/COLONIC TUBE PERCUT W/FLUORO  10/14/2019  . IRRIGATION AND DEBRIDEMENT SHOULDER Right 09/29/2017   Procedure: IRRIGATION AND DEBRIDEMENT SHOULDER;  Surgeon: Leandrew Koyanagi, MD;  Location: [place];  Service: Orthopedics;  Laterality: Right;  . [place] SURGERY  2002  . NASAL ENDOSCOPY WITH EPISTAXIS CONTROL N/A 08/31/2019   Procedure: NASAL ENDOSCOPY WITH EPISTAXIS CONTROL WITH CAUTERIZATION;  Surgeon: Melida Quitter, MD;  Location: [place];  Service: ENT;  Laterality: N/A;  . PORTACATH PLACEMENT N/A 09/11/2019   Procedure: PEG TUBE INSERTION - BEDSIDE;  Surgeon: Wonda Olds, MD;  Location: [place];  Service: Thoracic;  Laterality: N/A;  . TEE WITHOUT CARDIOVERSION N/A 08/28/2019   Procedure: TRANSESOPHAGEAL ECHOCARDIOGRAM (TEE);  Surgeon: Prescott Gum, Collier Salina, MD;  Location: [facility];  Service: Open Heart Surgery;  Laterality: N/A;  . TEE WITHOUT CARDIOVERSION N/A 09/10/2019   Procedure: TRANSESOPHAGEAL ECHOCARDIOGRAM (TEE);  Surgeon: Wonda Olds, MD;  Location: [place];  Service: Open Heart Surgery;  Laterality: N/A;  . WOUND DEBRIDEMENT Bilateral 03/27/2020   Procedure: EXCISIONAL DEBRIDEMENT OF ULCERS BILATERAL FEET;  Surgeon: Edrick Kins, DPM;  Location[facility];  Service: Podiatry;  Laterality: Bilateral;  Indication for ECMO: {Indication for ECMO:23306}  ECMO was deployed at *** and initiated at ***  Anticoagulation achieved with Heparin bolus of *** units given to patient at ***. Cannulated for    and achieved initial ECMO   and ECMO  .    ECMO Cannula Information     Staff Present  Primary Perfusionist ***  Assisting Perfusionist/ECMO Specialist ***  Cannulating Physician ***   ECMO Lot Numbers  CardioHelp Console    Oxygenator    Tubing Pack    ECMO Goals  Flow goal      Anticoagulation goal      Cardiac goal      Respiratory goal      Other goal       ECMO Handoff  Patient Information * Age Height Weight BSA IBW BMI  45 y.o. 5' 8"  (172.7 cm)  (119.3 kg Body surface area is 2.39 meters squared. No data recorded Body mass index is 39.99 kg/m.   Review History * Primary Diagnosis   COVID-19  Prior Cardiac Arrest within 24hrs of ECMO initiation?   ECMO and MCS * Type ECMO Flow ECMO Sweep Gases                 Additional Mechanical Support   Ventilation *          Cannula Size and Locations       Drainage    Return     *Cannula(e) sutured and anchored, secured and dressed.   Labs and Imaging *  *Cannulation position verified via imaging on arrival to ICU. Concerns communicated to attending surgeon. Labs reviewed.   All ECMO safety checks complete. ECMO flowsheet initiated, applicable charges captured, LDA's entered/confirmed, imaging and labs verified, blood products available, and report given to ***.

## 2020-08-17 NOTE — Progress Notes (Signed)
[name], [name] ([id number]) Visit Report for 08/13/2020 Arrival Information Details Patient Name: Date of Service: [name], [place] 08/13/2020 2:00 PM Medical Record Number: [id number] Patient Account Number: [id number] Date of Birth/Sex: Treating RN: 07-05-1976 (45 y.o. [name] Primary Care Tyaira Heward: [name] Other Clinician: Referring Zinnia Tindall: Treating Sutton Plake/Extender: [name] in Treatment: 4 Visit Information History Since Last Visit Added or deleted any medications: No Patient Arrived: Wheel Chair Any new allergies or adverse reactions: No Arrival Time: 14:21 Had a fall or experienced change in No Accompanied By: wife activities of daily living that may affect Transfer Assistance: None risk of falls: Patient Identification Verified: Yes Signs or symptoms of abuse/neglect since last visito No Secondary Verification Process Completed: Yes Hospitalized since last visit: No Patient Requires Transmission-Based Precautions: No Implantable device outside of the clinic excluding No Patient Has Alerts: No cellular tissue based products placed in the center since last visit: Has Dressing in Place as Prescribed: Yes Pain Present Now: Yes Electronic Signature(s) Signed: 08/17/2020 5:04:19 PM By: Levan Hurst RN, BSN Entered By: Levan Hurst on 08/13/2020 14:22:21 -------------------------------------------------------------------------------- Encounter Discharge Information Details Patient Name: Date of Service: [name], [place]. 08/13/2020 2:00 PM Medical Record Number: [id number] Patient Account Number: [id number] Date of Birth/Sex: Treating RN: [birth year] (45 y.o. [name] Primary Care Hyder Deman: [name] Other Clinician: Referring Tonika Eden: Treating Janese Radabaugh/Extender: [name] in Treatment: 4 Encounter Discharge Information Items Post Procedure Vitals Discharge Condition: Stable Temperature (F):  98.4 Ambulatory Status: Wheelchair Pulse (bpm): 74 Discharge Destination: Home Respiratory Rate (breaths/min): 18 Transportation: Private Auto Blood Pressure (mmHg): 147/88 Accompanied By: spouse Schedule Follow-up Appointment: Yes Clinical Summary of Care: Patient Declined Electronic Signature(s) Signed: 08/13/2020 6:02:34 PM By: Baruch Gouty RN, BSN Entered By: Baruch Gouty on 08/13/2020 15:25:17 -------------------------------------------------------------------------------- Lower Extremity Assessment Details Patient Name: Date of Service: [name], [place]. 08/13/2020 2:00 PM Medical Record Number: [id number] Patient Account Number: [id number] Date of Birth/Sex: Treating RN: March 31, 1976 (45 y.o. [name] Primary Care Shanyce Daris: [name] Other Clinician: Referring Milam Allbaugh: Treating Clancey Welton/Extender: [name] in Treatment: 4 Edema Assessment Assessed: [name]: No] [name]: No] Edema: [Left: Yes] [Right: Yes] Calf Left: Right: Point of Measurement: 34 cm From Medial Instep 41.2 cm 40.5 cm Ankle Left: Right: Point of Measurement: 12 cm From Medial Instep 25 cm 24.5 cm Vascular Assessment Pulses: Dorsalis Pedis Palpable: [Left:Yes] [Right:Yes] Electronic Signature(s) Signed: 08/17/2020 5:04:19 PM By: Levan Hurst RN, BSN Entered By: Levan Hurst on 08/13/2020 14:35:16 -------------------------------------------------------------------------------- Multi Wound Chart Details Patient Name: Date of Service: [name], [place]. 08/13/2020 2:00 PM Medical Record Number: [id number] Patient Account Number: [id number] Date of Birth/Sex: Treating RN: 1976/08/04 (45 y.o. [name], [name] Primary Care Sreenidhi Ganson: [name] Other Clinician: Referring Madelynn Malson: Treating Jhony Antrim/Extender: [name] in Treatment: 4 Vital Signs Height(in): 69 Pulse(bpm): 74 Weight(lbs): 280 Blood Pressure(mmHg): 147/88 Body Mass  Index(BMI): 41 Temperature(F): 98.4 Respiratory Rate(breaths/min): 18 Photos: [1:No Photos Right Calcaneus] [2:No Photos Left Calcaneus] [N/A:N/A N/A] Wound Location: [1:Pressure Injury] [2:Pressure Injury] [N/A:N/A] Wounding Event: [1:Pressure Ulcer] [2:Pressure Ulcer] [N/A:N/A] Primary Etiology: [1:Asthma, Angina, Hypertension] [2:Asthma, Angina, Hypertension] [N/A:N/A] Comorbid History: [1:10/07/2019] [2:10/07/2019] [N/A:N/A] Date Acquired: [1:4] [2:4] [N/A:N/A] Weeks of Treatment: [1:Open] [2:Open] [N/A:N/A] Wound Status: [1:8x2.7x0.5] [2:8.1x2.5x0.5] [N/A:N/A] Measurements L x W x D (cm) [1:16.965] [2:15.904] [N/A:N/A] A (cm) : rea [1:8.482] [6:1.950] [N/A:N/A] Volume (cm) : [1:46.30%] [2:41.30%] [N/A:N/A] % Reduction in A rea: [1:73.20%] [2:70.70%] [N/A:N/A] %  Reduction in Volume: [1:Category/Stage III] [2:Category/Stage III] [N/A:N/A] Classification: [1:Medium] [2:Medium] [N/A:N/A] Exudate A mount: [1:Serosanguineous] [2:Serosanguineous] [N/A:N/A] Exudate Type: [1:red, brown] [2:red, brown] [N/A:N/A] Exudate Color: [1:Distinct, outline attached] [2:Distinct, outline attached] [N/A:N/A] Wound Margin: [1:Medium (34-66%)] [2:Medium (34-66%)] [N/A:N/A] Granulation A mount: [1:Pink] [2:Pink] [N/A:N/A] Granulation Quality: [1:Medium (34-66%)] [2:Medium (34-66%)] [N/A:N/A] Necrotic A mount: [1:Fat Layer (Subcutaneous Tissue): Yes Fat Layer (Subcutaneous Tissue): Yes N/A] Exposed Structures: [1:Fascia: No Tendon: No Muscle: No Joint: No Bone: No Small (1-33%)] [2:Fascia: No Tendon: No Muscle: No Joint: No Bone: No Small (1-33%)] [N/A:N/A] Epithelialization: [1:Debridement - Excisional] [2:Debridement - Excisional] [N/A:N/A] Debridement: Pre-procedure Verification/Time Out 14:48 [2:14:48] [N/A:N/A] Taken: [1:Lidocaine 4% Topical Solution] [2:Lidocaine 4% Topical Solution] [N/A:N/A] Pain Control: [1:Subcutaneous, Slough] [2:Subcutaneous, Slough] [N/A:N/A] Tissue Debrided:  [1:Skin/Subcutaneous Tissue] [2:Skin/Subcutaneous Tissue] [N/A:N/A] Level: [1:21.6] [2:20.25] [N/A:N/A] Debridement A (sq cm): [1:rea Curette] [2:Curette] [N/A:N/A] Instrument: [1:Minimum] [2:Minimum] [N/A:N/A] Bleeding: [1:Pressure] [2:Pressure] [N/A:N/A] Hemostasis A chieved: [1:1] [2:1] [N/A:N/A] Procedural Pain: [1:4] [2:4] [N/A:N/A] Post Procedural Pain: [1:Procedure was tolerated well] [2:Procedure was tolerated well] [N/A:N/A] Debridement Treatment Response: [1:8x2.7x0.5] [2:8.1x2.5x0.5] [N/A:N/A] Post Debridement Measurements L x W x D (cm) [1:8.482] [4:6.270] [N/A:N/A] Post Debridement Volume: (cm) [1:Category/Stage III] [2:Category/Stage III] [N/A:N/A] Post Debridement Stage: [1:Debridement] [2:Debridement] [N/A:N/A] Treatment Notes Electronic Signature(s) Signed: 08/13/2020 5:18:36 PM By: Linton Ham MD Signed: 08/13/2020 6:28:52 PM By: [name] Entered By: Linton Ham on 08/13/2020 15:06:51 -------------------------------------------------------------------------------- Multi-Disciplinary Care Plan Details Patient Name: Date of Service: [name], [name] 08/13/2020 2:00 PM Medical Record Number: [id number] Patient Account Number: [id number] Date of Birth/Sex: Treating RN: Jun 07, 1976 (45 y.o. [name] Primary Care Chosen Geske: [name] Other Clinician: Referring Asyia Hornung: Treating Breckan Cafiero/Extender: [name] in Treatment: 4 Active Inactive Abuse / Safety / Falls / Self Care Management Nursing Diagnoses: Potential for falls Goals: Patient/caregiver will verbalize/demonstrate measures taken to prevent injury and/or falls Date Initiated: 07/15/2020 Target Resolution Date: 08/31/2020 Goal Status: Active Interventions: Assess fall risk on admission and as needed Assess impairment of mobility on admission and as needed per policy Notes: Pressure Nursing Diagnoses: Knowledge deficit related to management of pressures  ulcers Potential for impaired tissue integrity related to pressure, friction, moisture, and shear Goals: Patient/caregiver will verbalize understanding of pressure ulcer management Date Initiated: 07/15/2020 Target Resolution Date: 09/01/2020 Goal Status: Active Interventions: Assess: immobility, friction, shearing, incontinence upon admission and as needed Assess potential for pressure ulcer upon admission and as needed Notes: Wound/Skin Impairment Nursing Diagnoses: Impaired tissue integrity Knowledge deficit related to ulceration/compromised skin integrity Goals: Patient/caregiver will verbalize understanding of skin care regimen Date Initiated: 07/15/2020 Target Resolution Date: 09/02/2020 Goal Status: Active Ulcer/skin breakdown will have a volume reduction of 30% by week 4 Date Initiated: 07/15/2020 Target Resolution Date: 09/03/2020 Goal Status: Active Interventions: Assess patient/caregiver ability to obtain necessary supplies Assess patient/caregiver ability to perform ulcer/skin care regimen upon admission and as needed Assess ulceration(s) every visit Provide education on ulcer and skin care Treatment Activities: Skin care regimen initiated : 07/15/2020 Topical wound management initiated : 07/15/2020 Notes: Electronic Signature(s) Signed: 08/13/2020 6:28:52 PM By: [name] Entered By: [name] on 08/13/2020 15:01:59 -------------------------------------------------------------------------------- Pain Assessment Details Patient Name: Date of Service: [name], [place]. 08/13/2020 2:00 PM Medical Record Number: [id number] Patient Account Number: [id number] Date of Birth/Sex: Treating RN: [birth year] (45 y.o. [name] Primary Care Analina Filla: [name] Other Clinician: Referring Adonai Helzer: Treating Armonie Staten/Extender: [name] in Treatment: 4 Active Problems Location of Pain Severity and Description of Pain Patient  Has Paino  Yes Site Locations Pain Location: Pain Location: Pain in Ulcers With Dressing Change: Yes Rate the pain. Current Pain Level: 4 Character of Pain Describe the Pain: Aching Pain Management and Medication Current Pain Management: Medication: Yes Cold Application: No Rest: No Massage: No Activity: No T.E.N.S.: No Heat Application: No Leg drop or elevation: No Is the Current Pain Management Adequate: Adequate How does your wound impact your activities of daily livingo Sleep: No Bathing: No Appetite: No Relationship With Others: No Bladder Continence: No Emotions: No Bowel Continence: No Work: No Toileting: No Drive: No Dressing: No Hobbies: No Electronic Signature(s) Signed: 08/17/2020 5:04:19 PM By: Levan Hurst RN, BSN Entered By: Levan Hurst on 08/13/2020 14:23:07 -------------------------------------------------------------------------------- Patient/Caregiver Education Details Patient Name: Date of Service: [name] 1/6/2022andnbsp2:00 PM Medical Record Number: [id number] Patient Account Number: [id number] Date of Birth/Gender: Treating RN: [birth year] (45 y.o. [name] Primary Care Physician: [name] Other Clinician: Referring Physician: Treating Physician/Extender: [name] in Treatment: 4 Education Assessment Education Provided To: Patient Education Topics Provided Wound/Skin Impairment: Handouts: Skin Care Do's and Dont's Methods: Explain/Verbal Responses: Reinforcements needed Electronic Signature(s) Signed: 08/13/2020 6:28:52 PM By: [name] Signed: 08/13/2020 6:28:52 PM By: [name] Entered By: [name] on 08/13/2020 15:02:19 -------------------------------------------------------------------------------- Wound Assessment Details Patient Name: Date of Service: [name], [place] 08/13/2020 2:00 PM Medical Record Number: [id number] Patient Account Number: [id number] Date of  Birth/Sex: Treating RN: [birth year] (45 y.o. [name] Primary Care Trellis Vanoverbeke: [name] Other Clinician: Referring Sena Hoopingarner: Treating Jiya Kissinger/Extender: [name] in Treatment: 4 Wound Status Wound Number: 1 Primary Etiology: Pressure Ulcer Wound Location: Right Calcaneus Wound Status: Open Wounding Event: Pressure Injury Comorbid History: Asthma, Angina, Hypertension Date Acquired: 10/07/2019 Weeks Of Treatment: 4 Clustered Wound: No Wound Measurements Length: (cm) 8 Width: (cm) 2.7 Depth: (cm) 0.5 Area: (cm) 16.965 Volume: (cm) 8.482 % Reduction in Area: 46.3% % Reduction in Volume: 73.2% Epithelialization: Small (1-33%) Tunneling: No Undermining: No Wound Description Classification: Category/Stage III Wound Margin: Distinct, outline attached Exudate Amount: Medium Exudate Type: Serosanguineous Exudate Color: red, brown Foul Odor After Cleansing: No Slough/Fibrino Yes Wound Bed Granulation Amount: Medium (34-66%) Exposed Structure Granulation Quality: Pink Fascia Exposed: No Necrotic Amount: Medium (34-66%) Fat Layer (Subcutaneous Tissue) Exposed: Yes Necrotic Quality: Adherent Slough Tendon Exposed: No Muscle Exposed: No Joint Exposed: No Bone Exposed: No Treatment Notes Wound #1 (Calcaneus) Wound Laterality: Right Cleanser Soap and Water Discharge Instruction: May shower and wash wound with dial antibacterial soap and water prior to dressing change. Peri-Wound Care Topical Primary Dressing Iodosorb Gel 10 (gm) Tube Discharge Instruction: Apply to wound bed as instructed Secondary Dressing Woven Gauze Sponge, Non-Sterile 4x4 in Discharge Instruction: Apply over primary dressing as directed. ABD Pad, 5x9 Discharge Instruction: Apply over primary dressing as directed. Secured With [employer], 4.5x3.1 (in/yd) Discharge Instruction: Secure with Kerlix as directed. Paper Tape, 2x10 (in/yd) Discharge  Instruction: Secure dressing with tape as directed. Compression Wrap Compression Stockings Add-Ons Electronic Signature(s) Signed: 08/17/2020 5:04:19 PM By: Levan Hurst RN, BSN Entered By: Levan Hurst on 08/13/2020 14:35:03 -------------------------------------------------------------------------------- Wound Assessment Details Patient Name: Date of Service: [name], [place] 08/13/2020 2:00 PM Medical Record Number: [id number] Patient Account Number: [id number] Date of Birth/Sex: Treating RN: [birth year] (45 y.o. [name] Primary Care Janelle Culton: [name] Other Clinician: Referring Senie Lanese: Treating Vihaan Gloss/Extender: [name] in Treatment: 4 Wound Status Wound Number: 2 Primary Etiology: Pressure  Ulcer Wound Location: Left Calcaneus Wound Status: Open Wounding Event: Pressure Injury Comorbid History: Asthma, Angina, Hypertension Date Acquired: 10/07/2019 Weeks Of Treatment: 4 Clustered Wound: No Wound Measurements Length: (cm) 8.1 Width: (cm) 2.5 Depth: (cm) 0.5 Area: (cm) 15.904 Volume: (cm) 7.952 % Reduction in Area: 41.3% % Reduction in Volume: 70.7% Epithelialization: Small (1-33%) Tunneling: No Undermining: No Wound Description Classification: Category/Stage III Wound Margin: Distinct, outline attached Exudate Amount: Medium Exudate Type: Serosanguineous Exudate Color: red, brown Foul Odor After Cleansing: No Slough/Fibrino Yes Wound Bed Granulation Amount: Medium (34-66%) Exposed Structure Granulation Quality: Pink Fascia Exposed: No Necrotic Amount: Medium (34-66%) Fat Layer (Subcutaneous Tissue) Exposed: Yes Necrotic Quality: Adherent Slough Tendon Exposed: No Muscle Exposed: No Joint Exposed: No Bone Exposed: No Treatment Notes Wound #2 (Calcaneus) Wound Laterality: Left Cleanser Soap and Water Discharge Instruction: May shower and wash wound with dial antibacterial soap and water prior to dressing  change. Peri-Wound Care Topical Primary Dressing Iodosorb Gel 10 (gm) Tube Discharge Instruction: Apply to wound bed as instructed Secondary Dressing Woven Gauze Sponge, Non-Sterile 4x4 in Discharge Instruction: Apply over primary dressing as directed. ABD Pad, 5x9 Discharge Instruction: Apply over primary dressing as directed. Secured With [employer], 4.5x3.1 (in/yd) Discharge Instruction: Secure with Kerlix as directed. Paper Tape, 2x10 (in/yd) Discharge Instruction: Secure dressing with tape as directed. Compression Wrap Compression Stockings Add-Ons Electronic Signature(s) Signed: 08/17/2020 5:04:19 PM By: Levan Hurst RN, BSN Entered By: Levan Hurst on 08/13/2020 14:34:40 -------------------------------------------------------------------------------- Vitals Details Patient Name: Date of Service: [name], [place] 08/13/2020 2:00 PM Medical Record Number: [id number] Patient Account Number: [id number] Date of Birth/Sex: Treating RN: 18-Nov-1975 (45 y.o. [name] Primary Care Elster Corbello: [name] Other Clinician: Referring Godwin Tedesco: Treating Jonna Dittrich/Extender: [name] in Treatment: 4 Vital Signs Time Taken: 14:22 Temperature (F): 98.4 Height (in): 69 Pulse (bpm): 74 Weight (lbs): 280 Respiratory Rate (breaths/min): 18 Body Mass Index (BMI): 41.3 Blood Pressure (mmHg): 147/88 Reference Range: 80 - 120 mg / dl Electronic Signature(s) Signed: 08/17/2020 5:04:19 PM By: Levan Hurst RN, BSN Entered By: Levan Hurst on 08/13/2020 14:22:41

## 2020-08-18 ENCOUNTER — Other Ambulatory Visit: Payer: Self-pay | Admitting: Internal Medicine

## 2020-08-18 ENCOUNTER — Other Ambulatory Visit (HOSPITAL_COMMUNITY): Payer: Self-pay | Admitting: Internal Medicine

## 2020-08-18 DIAGNOSIS — L89893 Pressure ulcer of other site, stage 3: Secondary | ICD-10-CM

## 2020-08-20 ENCOUNTER — Other Ambulatory Visit: Payer: Self-pay

## 2020-08-20 ENCOUNTER — Encounter (HOSPITAL_BASED_OUTPATIENT_CLINIC_OR_DEPARTMENT_OTHER): Payer: BC Managed Care – PPO | Admitting: Internal Medicine

## 2020-08-20 DIAGNOSIS — M6281 Muscle weakness (generalized): Secondary | ICD-10-CM | POA: Diagnosis not present

## 2020-08-20 DIAGNOSIS — Z8616 Personal history of COVID-19: Secondary | ICD-10-CM | POA: Diagnosis not present

## 2020-08-20 DIAGNOSIS — I10 Essential (primary) hypertension: Secondary | ICD-10-CM | POA: Diagnosis not present

## 2020-08-20 DIAGNOSIS — L89893 Pressure ulcer of other site, stage 3: Secondary | ICD-10-CM | POA: Diagnosis not present

## 2020-08-20 NOTE — Progress Notes (Signed)
[name], [name] ([id number]) Visit Report for 08/20/2020 HPI Details Patient Name: Date of Service: [name], [name] 08/20/2020 8:30 A M Medical Record Number: [id number] Patient Account Number: [id number] Date of Birth/Sex: Treating RN: [birth year] (45 y.o. [name] Primary Care Provider: Cristie [name] Other Clinician: Referring Provider: Treating Provider/Extender: [name] in Treatment: 5 History of Present Illness HPI Description: 07/15/2020 upon evaluation today patient presents for initial inspection here in our clinic concerning issues he has been having with the bottoms of his feet bilaterally. He states these actually occurred as wounds when he was hospitalized for 5 months secondary to Covid. He was apparently with tilting bed where he was in an upright position quite frequently and apparently this occurred in some way shape or form during that time. Fortunately there is no sign of active infection at this time. No fevers, chills, nausea, vomiting, or diarrhea. With that being said he still has substantial wounds on the plantar aspects of his feet Theragen require quite a bit of work to get these to heal. He has been using Santyl currently though that is been problematic both in receiving the medication as well as actually paid for it as it is become quite expensive. Prior to the experience with Covid the patient really did not have any major medical problems other than hypertension he does have some mild generalized weakness following the Covid experience. 07/22/2020 on evaluation today patient appears to be doing okay in regard to his foot ulcers I feel like the wound beds are showing signs of better improvement that I do believe the Iodoflex is helping in this regard. With that being said he does have a lot of drainage currently and this is somewhat blue/green in nature which is consistent with Pseudomonas. I do think a culture today would be appropriate  for [place] to evaluate and see if that is indeed the case I would likely start him on antibiotic orally as well he is not allergic to Cipro knows of no issues he has had in the past 12/21; patient was admitted to the clinic earlier this month with bilateral presumed pressure ulcers on the bottom of his feet apparently related to excessive pressure from a tilt table arrangement in the intensive care unit. Patient relates this to being on ECMO but I am not really sure that is exactly related to that. I must say I have never seen anything like this. He has fairly extensive full-thickness wounds extending from his heel towards his midfoot mostly centered laterally. There is already been some healing distally. He does not appear to have an arterial issue. He has been using gentamicin to the wound surfaces with Iodoflex to help with ongoing debridement 1/6; this is a patient with pressure ulcers on the bottom of his feet related to excessive pressure from a standing position in the intensive care unit. He is complaining of a lot of pain in the right heel. He is not a diabetic. 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 We have been using Iodoflex to help prepare the surfaces of both wounds for an advanced treatment product. He is nonambulatory spending most of his time in a wheelchair I have asked him not to propel the wheelchair with his heels 1/13; in general his wounds look better not much surface area change we have been using Iodoflex as of last week. I did an x-ray of the right heel as the patient was complaining of pain. I  had some thoughts about a stress fracture perhaps Achilles tendon problems however what it showed was erosive changes along the inferior aspect of the calcaneus he now has a MRI booked for 1/20 Electronic Signature(s) Signed: 08/20/2020 5:01:02 PM By: [name] Ham MD Entered By: [name] on 08/20/2020  09:32:24 -------------------------------------------------------------------------------- Physical Exam Details Patient Name: Date of Service: [name], [place]. 08/20/2020 8:30 A M Medical Record Number: [id number] Patient Account Number: [id number] Date of Birth/Sex: Treating RN: [birth year] (45 y.o. [name] Primary Care Provider: Cristie [name] Other Clinician: Referring Provider: Treating Provider/Extender: [name] in Treatment: 5 Constitutional Sitting or standing Blood Pressure is within target range for patient.. Pulse regular and within target range for patient.Marland Kitchen Respirations regular, non-labored and within target range.. Temperature is normal and within the target range for the patient.Marland Kitchen Appears in no distress. Cardiovascular Pedal pulses palpable and strong bilaterally.. Notes Wound exam; extensive areas on both heels teardrop shaped wounds maximal in the calcaneus plantar aspect with the narrow area towards the forefoot. There is no exposed bone no purulence no erythema around the wound. I did not think this required mechanical debridement Electronic Signature(s) Signed: 08/20/2020 5:01:02 PM By: [name] Ham MD Entered By: [name] on 08/20/2020 09:33:48 -------------------------------------------------------------------------------- Physician Orders Details Patient Name: Date of Service: [name], [name] 08/20/2020 8:30 A M Medical Record Number: [id number] Patient Account Number: [id number] Date of Birth/Sex: Treating RN: [birth year] (45 y.o. [name] Primary Care Provider: Cristie [name] Other Clinician: Referring Provider: Treating Provider/Extender: [name] in Treatment: 5 Verbal / Phone Orders: No Diagnosis Coding ICD-10 Coding Code Description [phone] Pressure ulcer of other site, stage 3 M62.81 Muscle weakness (generalized) I10 Essential (primary) hypertension Follow-up  Appointments Return Appointment in 1 week. Bathing/ Shower/ Hygiene May shower and wash wound with soap and water. - wash wounds with dial antibacterial soap Edema Control - Lymphedema / SCD / Other Bilateral Lower Extremities Elevate legs to the level of the heart or above for 30 minutes daily and/or when sitting, a frequency of: Avoid standing for long periods of time. Off-Loading Other: - keep pressure off of the bottom of your feet Wound Treatment Wound #1 - Calcaneus Wound Laterality: Right Cleanser: Soap and Water 3 x Per Week/30 Days Discharge Instructions: May shower and wash wound with dial antibacterial soap and water prior to dressing change. Prim Dressing: Iodosorb Gel 10 (gm) Tube (Generic) 3 x Per Week/30 Days ary Discharge Instructions: Apply to wound bed as instructed Secondary Dressing: Woven Gauze Sponge, Non-Sterile 4x4 in (Generic) 3 x Per Week/30 Days Discharge Instructions: Apply over primary dressing as directed. Secondary Dressing: ABD Pad, 5x9 3 x Per Week/30 Days Discharge Instructions: Apply over primary dressing as directed. Secured With: Elastic Bandage 4 inch (ACE bandage) 3 x Per Week/30 Days Discharge Instructions: Secure with ACE bandage as directed. Secured With: [employer], 4.5x3.1 (in/yd) (Generic) 3 x Per Week/30 Days Discharge Instructions: Secure with Kerlix as directed. Secured With: Paper Tape, 2x10 (in/yd) (Generic) 3 x Per Week/30 Days Discharge Instructions: Secure dressing with tape as directed. Wound #2 - Calcaneus Wound Laterality: Left Cleanser: Soap and Water 3 x Per Week/30 Days Discharge Instructions: May shower and wash wound with dial antibacterial soap and water prior to dressing change. Prim Dressing: Iodosorb Gel 10 (gm) Tube (Generic) 3 x Per Week/30 Days ary Discharge Instructions: Apply to wound bed as instructed Secondary Dressing: Woven Gauze Sponge, Non-Sterile 4x4 in (Generic) 3  x Per Week/30 Days Discharge  Instructions: Apply over primary dressing as directed. Secondary Dressing: ABD Pad, 5x9 3 x Per Week/30 Days Discharge Instructions: Apply over primary dressing as directed. Secured With: Elastic Bandage 4 inch (ACE bandage) 3 x Per Week/30 Days Discharge Instructions: Secure with ACE bandage as directed. Secured With: [employer], 4.5x3.1 (in/yd) (Generic) 3 x Per Week/30 Days Discharge Instructions: Secure with Kerlix as directed. Secured With: Paper Tape, 2x10 (in/yd) (Generic) 3 x Per Week/30 Days Discharge Instructions: Secure dressing with tape as directed. Electronic Signature(s) Signed: 08/20/2020 5:01:02 PM By: [name] Ham MD Signed: 08/20/2020 5:39:22 PM By: [name] Entered By: [name] on 08/20/2020 09:07:14 -------------------------------------------------------------------------------- Problem List Details Patient Name: Date of Service: [name], [place] 08/20/2020 8:30 A M Medical Record Number: [id number] Patient Account Number: [id number] Date of Birth/Sex: Treating RN: 03-17-1976 (45 y.o. [name] Primary Care Provider: Cristie [name] Other Clinician: Referring Provider: Treating Provider/Extender: [name] in Treatment: 5 Active Problems ICD-10 Encounter Code Description Active Date MDM Diagnosis [phone] Pressure ulcer of other site, stage 3 07/15/2020 No Yes M62.81 Muscle weakness (generalized) 07/15/2020 No Yes I10 Essential (primary) hypertension 07/15/2020 No Yes Inactive Problems Resolved Problems Electronic Signature(s) Signed: 08/20/2020 5:01:02 PM By: [name] Ham MD Entered By: [name] on 08/20/2020 09:31:11 -------------------------------------------------------------------------------- Progress Note Details Patient Name: Date of Service: [name], [place] 08/20/2020 8:30 A M Medical Record Number: [id number] Patient Account Number: [id number] Date of Birth/Sex: Treating RN: June 23, 1976 (45  y.o. [name] Primary Care Provider: Cristie [name] Other Clinician: Referring Provider: Treating Provider/Extender: [name] in Treatment: 5 Subjective History of Present Illness (HPI) 07/15/2020 upon evaluation today patient presents for initial inspection here in our clinic concerning issues he has been having with the bottoms of his feet bilaterally. He states these actually occurred as wounds when he was hospitalized for 5 months secondary to Covid. He was apparently with tilting bed where he was in an upright position quite frequently and apparently this occurred in some way shape or form during that time. Fortunately there is no sign of active infection at this time. No fevers, chills, nausea, vomiting, or diarrhea. With that being said he still has substantial wounds on the plantar aspects of his feet Theragen require quite a bit of work to get these to heal. He has been using Santyl currently though that is been problematic both in receiving the medication as well as actually paid for it as it is become quite expensive. Prior to the experience with Covid the patient really did not have any major medical problems other than hypertension he does have some mild generalized weakness following the Covid experience. 07/22/2020 on evaluation today patient appears to be doing okay in regard to his foot ulcers I feel like the wound beds are showing signs of better improvement that I do believe the Iodoflex is helping in this regard. With that being said he does have a lot of drainage currently and this is somewhat blue/green in nature which is consistent with Pseudomonas. I do think a culture today would be appropriate for [place] to evaluate and see if that is indeed the case I would likely start him on antibiotic orally as well he is not allergic to Cipro knows of no issues he has had in the past 12/21; patient was admitted to the clinic earlier this month with bilateral  presumed pressure ulcers on the bottom of his feet apparently  related to excessive pressure from a tilt table arrangement in the intensive care unit. Patient relates this to being on ECMO but I am not really sure that is exactly related to that. I must say I have never seen anything like this. He has fairly extensive full-thickness wounds extending from his heel towards his midfoot mostly centered laterally. There is already been some healing distally. He does not appear to have an arterial issue. He has been using gentamicin to the wound surfaces with Iodoflex to help with ongoing debridement 1/6; this is a patient with pressure ulcers on the bottom of his feet related to excessive pressure from a standing position in the intensive care unit. He is complaining of a lot of pain in the right heel. He is not a diabetic. 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 We have been using Iodoflex to help prepare the surfaces of both wounds for an advanced treatment product. He is nonambulatory spending most of his time in a wheelchair I have asked him not to propel the wheelchair with his heels 1/13; in general his wounds look better not much surface area change we have been using Iodoflex as of last week. I did an x-ray of the right heel as the patient was complaining of pain. I had some thoughts about a stress fracture perhaps Achilles tendon problems however what it showed was erosive changes along the inferior aspect of the calcaneus he now has a MRI booked for 1/20 Objective Constitutional Sitting or standing Blood Pressure is within target range for patient.. Pulse regular and within target range for patient.Marland Kitchen Respirations regular, non-labored and within target range.. Temperature is normal and within the target range for the patient.Marland Kitchen Appears in no distress. Vitals Time Taken: 8:41 AM, Height: 69 in, Weight: 280 lbs, BMI: 41.3, Temperature: 97.7 F, Pulse: 74 bpm, Respiratory  Rate: 18 breaths/min, Blood Pressure: 134/82 mmHg. Cardiovascular Pedal pulses palpable and strong bilaterally.. General Notes: Wound exam; extensive areas on both heels teardrop shaped wounds maximal in the calcaneus plantar aspect with the narrow area towards the forefoot. There is no exposed bone no purulence no erythema around the wound. I did not think this required mechanical debridement Integumentary (Hair, Skin) Wound #1 status is Open. Original cause of wound was Pressure Injury. The wound is located on the Right Calcaneus. The wound measures 7.8cm length x 2.7cm width x 0.8cm depth; 16.54cm^2 area and 13.232cm^3 volume. There is Fat Layer (Subcutaneous Tissue) exposed. There is no undermining noted. There is a medium amount of serosanguineous drainage noted. The wound margin is distinct with the outline attached to the wound base. There is medium (34-66%) pink granulation within the wound bed. There is a medium (34-66%) amount of necrotic tissue within the wound bed including Adherent Slough. Wound #2 status is Open. Original cause of wound was Pressure Injury. The wound is located on the Left Calcaneus. The wound measures 7.8cm length x 2.6cm width x 0.6cm depth; 15.928cm^2 area and 9.557cm^3 volume. There is Fat Layer (Subcutaneous Tissue) exposed. There is no tunneling or undermining noted. There is a medium amount of serosanguineous drainage noted. The wound margin is distinct with the outline attached to the wound base. There is medium (34-66%) pink granulation within the wound bed. There is a medium (34-66%) amount of necrotic tissue within the wound bed including Adherent Slough. Assessment Active Problems ICD-10 Pressure ulcer of other site, stage 3 Muscle weakness (generalized) Essential (primary) hypertension Plan Follow-up Appointments: Return Appointment in 1  week. Bathing/ Shower/ Hygiene: May shower and wash wound with soap and water. - wash wounds with dial  antibacterial soap Edema Control - Lymphedema / SCD / Other: Elevate legs to the level of the heart or above for 30 minutes daily and/or when sitting, a frequency of: Avoid standing for long periods of time. Off-Loading: Other: - keep pressure off of the bottom of your feet WOUND #1: - Calcaneus Wound Laterality: Right Cleanser: Soap and Water 3 x Per Week/30 Days Discharge Instructions: May shower and wash wound with dial antibacterial soap and water prior to dressing change. Prim Dressing: Iodosorb Gel 10 (gm) Tube (Generic) 3 x Per Week/30 Days ary Discharge Instructions: Apply to wound bed as instructed Secondary Dressing: Woven Gauze Sponge, Non-Sterile 4x4 in (Generic) 3 x Per Week/30 Days Discharge Instructions: Apply over primary dressing as directed. Secondary Dressing: ABD Pad, 5x9 3 x Per Week/30 Days Discharge Instructions: Apply over primary dressing as directed. Secured With: Elastic Bandage 4 inch (ACE bandage) 3 x Per Week/30 Days Discharge Instructions: Secure with ACE bandage as directed. Secured With: [employer], 4.5x3.1 (in/yd) (Generic) 3 x Per Week/30 Days Discharge Instructions: Secure with Kerlix as directed. Secured With: Paper T ape, 2x10 (in/yd) (Generic) 3 x Per Week/30 Days Discharge Instructions: Secure dressing with tape as directed. WOUND #2: - Calcaneus Wound Laterality: Left Cleanser: Soap and Water 3 x Per Week/30 Days Discharge Instructions: May shower and wash wound with dial antibacterial soap and water prior to dressing change. Prim Dressing: Iodosorb Gel 10 (gm) Tube (Generic) 3 x Per Week/30 Days ary Discharge Instructions: Apply to wound bed as instructed Secondary Dressing: Woven Gauze Sponge, Non-Sterile 4x4 in (Generic) 3 x Per Week/30 Days Discharge Instructions: Apply over primary dressing as directed. Secondary Dressing: ABD Pad, 5x9 3 x Per Week/30 Days Discharge Instructions: Apply over primary dressing as directed. Secured  With: Elastic Bandage 4 inch (ACE bandage) 3 x Per Week/30 Days Discharge Instructions: Secure with ACE bandage as directed. Secured With: [employer], 4.5x3.1 (in/yd) (Generic) 3 x Per Week/30 Days Discharge Instructions: Secure with Kerlix as directed. Secured With: Paper T ape, 2x10 (in/yd) (Generic) 3 x Per Week/30 Days Discharge Instructions: Secure dressing with tape as directed. 1. I am continuing with the Iodoflex for this week 2. I did my best to reassure the patient that I am not going to even think about osteomyelitis until I see the MRI. He does not have any exposed bone I would not be able to get a specimen 3. We put try tri layer Oasis through although we have not heard back on this. 4. Pending the Oasis I will probably move to silver collagen at some point Electronic Signature(s) Signed: 08/20/2020 5:01:02 PM By: [name] Ham MD Entered By: [name] on 08/20/2020 09:35:40 -------------------------------------------------------------------------------- SuperBill Details Patient Name: Date of Service: [name], [place]. 08/20/2020 Medical Record Number: [id number] Patient Account Number: [id number] Date of Birth/Sex: Treating RN: 27-Mar-1976 (46 y.o. [name] Primary Care Provider: Cristie [name] Other Clinician: Referring Provider: Treating Provider/Extender: [name] in Treatment: 5 Diagnosis Coding ICD-10 Codes Code Description [phone] Pressure ulcer of other site, stage 3 M62.81 Muscle weakness (generalized) I10 Essential (primary) hypertension Facility Procedures CPT4 Code: [id number] Description: [phone] - WOUND CARE VISIT-LEV 5 EST PT Modifier: Quantity: 1 Physician Procedures : CPT4 Code Description Modifier [id number] - WC PHYS LEVEL 3 - EST PT ICD-10 Diagnosis Description L89.893 Pressure ulcer of other site, stage 3 Quantity:  1 Electronic Signature(s) Signed: 08/20/2020 5:01:02 PM By: [name] Ham  MD Entered By: [name] on 08/20/2020 09:36:00

## 2020-08-20 NOTE — Progress Notes (Signed)
PAL, SHELL ([id number]) Visit Report for 08/20/2020 Arrival Information Details Patient Name: Date of Service: [name], [name] 08/20/2020 8:30 A M Medical Record Number: [id number] Patient Account Number: [id number] Date of Birth/Sex: Treating RN: 10-May-1976 (45 y.o. [name] Primary Care [name]: [name] Other Clinician: Referring [name]: Treating Jamy Whyte/Extender: [name] in Treatment: 5 Visit Information History Since Last Visit Added or deleted any medications: No Patient Arrived: Wheel Chair Any new allergies or adverse reactions: No Arrival Time: 08:40 Had a fall or experienced change in No Accompanied By: spouse activities of daily living that may affect Transfer Assistance: None risk of falls: Patient Identification Verified: Yes Signs or symptoms of abuse/neglect since last visito No Secondary Verification Process Completed: Yes Hospitalized since last visit: No Patient Requires Transmission-Based Precautions: No Implantable device outside of the clinic excluding No Patient Has Alerts: No cellular tissue based products placed in the center since last visit: Has Dressing in Place as Prescribed: Yes Pain Present Now: Yes Electronic Signature(s) Signed: 08/20/2020 5:28:50 PM By: [name] Hurst RN, BSN Entered By: [name] on 08/20/2020 08:41:48 -------------------------------------------------------------------------------- Clinic Level of Care Assessment Details Patient Name: Date of Service: [name], [name] [place] E. 08/20/2020 8:30 A M Medical Record Number: [id number] Patient Account Number: [id number] Date of Birth/Sex: Treating RN: 1975/08/16 (45 y.o. [name], [name] Primary Care [name]: [name] Other Clinician: Referring [name]: Treating [name]/Extender: [name] in Treatment: 5 Clinic Level of Care Assessment Items TOOL 4 Quantity Score X- 1 0 Use when only an EandM is  performed on FOLLOW-UP visit ASSESSMENTS - Nursing Assessment / Reassessment X- 1 10 Reassessment of Co-morbidities (includes updates in patient status) X- 1 5 Reassessment of Adherence [name] Treatment Plan ASSESSMENTS - Wound and Skin A ssessment / Reassessment []  - 0 Simple Wound Assessment / Reassessment - one wound X- 2 5 Complex Wound Assessment / Reassessment - multiple wounds X- 1 10 Dermatologic / Skin Assessment (not related [name] wound area) ASSESSMENTS - Focused Assessment X- 2 5 Circumferential Edema Measurements - multi extremities X- 1 10 Nutritional Assessment / Counseling / Intervention []  - 0 Lower Extremity Assessment (monofilament, tuning fork, pulses) []  - 0 Peripheral Arterial Disease Assessment (using hand held doppler) ASSESSMENTS - Ostomy and/or Continence Assessment and Care []  - 0 Incontinence Assessment and Management []  - 0 Ostomy Care Assessment and Management (repouching, etc.) PROCESS - Coordination of Care []  - 0 Simple Patient / Family Education for ongoing care X- 1 20 Complex (extensive) Patient / Family Education for ongoing care X- 1 10 Staff obtains Programmer, systems, Records, T Results / Process Orders est []  - 0 Staff telephones HHA, Nursing Homes / Clarify orders / etc []  - 0 Routine Transfer [name] another Facility (non-emergent condition) []  - 0 Routine Hospital Admission (non-emergent condition) []  - 0 New Admissions / Biomedical engineer / Ordering NPWT Apligraf, etc. , []  - 0 Emergency Hospital Admission (emergent condition) []  - 0 Simple Discharge Coordination X- 1 15 Complex (extensive) Discharge Coordination PROCESS - Special Needs []  - 0 Pediatric / Minor Patient Management []  - 0 Isolation Patient Management []  - 0 Hearing / Language / Visual special needs []  - 0 Assessment of Community assistance (transportation, D/C planning, etc.) []  - 0 Additional assistance / Altered mentation []  - 0 Support Surface(s) Assessment  (bed, cushion, seat, etc.) INTERVENTIONS - Wound Cleansing / Measurement []  - 0 Simple Wound Cleansing - one wound X- 2 5 Complex Wound Cleansing -  multiple wounds X- 1 5 Wound Imaging (photographs - any number of wounds) []  - 0 Wound Tracing (instead of photographs) []  - 0 Simple Wound Measurement - one wound X- 2 5 Complex Wound Measurement - multiple wounds INTERVENTIONS - Wound Dressings []  - 0 Small Wound Dressing one or multiple wounds X- 2 15 Medium Wound Dressing one or multiple wounds []  - 0 Large Wound Dressing one or multiple wounds []  - 0 Application of Medications - topical []  - 0 Application of Medications - injection INTERVENTIONS - Miscellaneous []  - 0 External ear exam []  - 0 Specimen Collection (cultures, biopsies, blood, body fluids, etc.) []  - 0 Specimen(s) / Culture(s) sent or taken [name] Lab for analysis []  - 0 Patient Transfer (multiple staff / Civil Service fast streamer / Similar devices) []  - 0 Simple Staple / Suture removal (25 or less) []  - 0 Complex Staple / Suture removal (26 or more) []  - 0 Hypo / Hyperglycemic Management (close monitor of Blood Glucose) []  - 0 Ankle / Brachial Index (ABI) - do not check if billed separately X- 1 5 Vital Signs Has the patient been seen at the hospital within the last three years: Yes Total Score: 160 Level Of Care: New/Established - Level 5 Electronic Signature(s) Signed: 08/20/2020 5:39:22 PM By: [name] Entered By: [name] on 08/20/2020 40:10:27 -------------------------------------------------------------------------------- Encounter Discharge Information Details Patient Name: Date of Service: [name], [name] [place] E. 08/20/2020 8:30 A M Medical Record Number: [id number] Patient Account Number: [id number] Date of Birth/Sex: Treating RN: 03/07/1976 (45 y.o. [name] Primary Care [name]: [name] Other Clinician: Referring [name]: Treating [name]/Extender: [name]  in Treatment: 5 Encounter Discharge Information Items Discharge Condition: Stable Ambulatory Status: Wheelchair Discharge Destination: Home Transportation: Private Auto Accompanied By: spouse Schedule Follow-up Appointment: Yes Clinical Summary of Care: Patient Declined Electronic Signature(s) Signed: 08/20/2020 5:40:31 PM By: Baruch Gouty RN, BSN Entered By: Baruch Gouty on 08/20/2020 09:30:35 -------------------------------------------------------------------------------- Lower Extremity Assessment Details Patient Name: Date of Service: [name], [place] 08/20/2020 8:30 A M Medical Record Number: [id number] Patient Account Number: [id number] Date of Birth/Sex: Treating RN: 04-02-1976 (45 y.o. [name] Primary Care Dmarco Baldus: [name] Other Clinician: Referring Chia Mowers: Treating Kashawn Manzano/Extender: [name] in Treatment: 5 Edema Assessment Assessed: [name]: No] [name]: No] Edema: [Left: Yes] [Right: Yes] Calf Left: Right: Point of Measurement: 34 cm From Medial Instep 41 cm 37 cm Ankle Left: Right: Point of Measurement: 12 cm From Medial Instep 23 cm 23 cm Vascular Assessment Pulses: Dorsalis Pedis Palpable: [Left:Yes] [Right:Yes] Electronic Signature(s) Signed: 08/20/2020 5:28:50 PM By: [name] Hurst RN, BSN Entered By: [name] on 08/20/2020 08:54:40 -------------------------------------------------------------------------------- Multi Wound Chart Details Patient Name: Date of Service: [name], [place] 08/20/2020 8:30 A M Medical Record Number: [id number] Patient Account Number: [id number] Date of Birth/Sex: Treating RN: November 02, 1975 (45 y.o. [name] Primary Care Yvette Roark: [name] Other Clinician: Referring Alaya Iverson: Treating Dewaun Kinzler/Extender: [name] in Treatment: 5 Vital Signs Height(in): 48 Pulse(bpm): 10 Weight(lbs): 280 Blood Pressure(mmHg): 134/82 Body Mass Index(BMI):  41 Temperature(F): 97.7 Respiratory Rate(breaths/min): 18 Photos: [1:No Photos Right Calcaneus] [2:No Photos Left Calcaneus] [N/A:N/A N/A] Wound Location: [1:Pressure Injury] [2:Pressure Injury] [N/A:N/A] Wounding Event: [1:Pressure Ulcer] [2:Pressure Ulcer] [N/A:N/A] Primary Etiology: [1:Asthma, Angina, Hypertension] [2:Asthma, Angina, Hypertension] [N/A:N/A] Comorbid History: [1:10/07/2019] [2:10/07/2019] [N/A:N/A] Date Acquired: [1:5] [2:5] [N/A:N/A] Weeks of Treatment: [1:Open] [2:Open] [N/A:N/A] Wound Status: [1:7.8x2.7x0.8] [2:7.8x2.6x0.6] [N/A:N/A] Measurements L x W x D (cm) [1:16.54] [  2:15.928] [N/A:N/A] A (cm) : rea [1:13.232] [2:9.557] [N/A:N/A] Volume (cm) : [1:47.70%] [2:41.20%] [N/A:N/A] % Reduction in A rea: [1:58.20%] [2:64.70%] [N/A:N/A] % Reduction in Volume: [1:Category/Stage III] [2:Category/Stage III] [N/A:N/A] Classification: [1:Medium] [2:Medium] [N/A:N/A] Exudate A mount: [1:Serosanguineous] [2:Serosanguineous] [N/A:N/A] Exudate Type: [1:red, brown] [2:red, brown] [N/A:N/A] Exudate Color: [1:Distinct, outline attached] [2:Distinct, outline attached] [N/A:N/A] Wound Margin: [1:Medium (34-66%)] [2:Medium (34-66%)] [N/A:N/A] Granulation A mount: [1:Pink] [2:Pink] [N/A:N/A] Granulation Quality: [1:Medium (34-66%)] [2:Medium (34-66%)] [N/A:N/A] Necrotic A mount: [1:Fat Layer (Subcutaneous Tissue): Yes Fat Layer (Subcutaneous Tissue): Yes N/A] Exposed Structures: [1:Fascia: No Tendon: No Muscle: No Joint: No Bone: No Small (1-33%)] [2:Fascia: No Tendon: No Muscle: No Joint: No Bone: No Small (1-33%)] [N/A:N/A] Treatment Notes Wound #1 (Calcaneus) Wound Laterality: Right Cleanser Soap and Water Discharge Instruction: May shower and wash wound with dial antibacterial soap and water prior [name] dressing change. Peri-Wound Care Topical Primary Dressing Iodosorb Gel 10 (gm) Tube Discharge Instruction: Apply [name] wound bed as instructed Secondary Dressing Woven Gauze  Sponge, Non-Sterile 4x4 in Discharge Instruction: Apply over primary dressing as directed. ABD Pad, 5x9 Discharge Instruction: Apply over primary dressing as directed. Secured With Elastic Bandage 4 inch (ACE bandage) Discharge Instruction: Secure with ACE bandage as directed. Kerlix Roll Sterile, 4.5x3.1 (in/yd) Discharge Instruction: Secure with Kerlix as directed. Paper Tape, 2x10 (in/yd) Discharge Instruction: Secure dressing with tape as directed. Compression Wrap Compression Stockings Add-Ons Wound #2 (Calcaneus) Wound Laterality: Left Cleanser Soap and Water Discharge Instruction: May shower and wash wound with dial antibacterial soap and water prior [name] dressing change. Peri-Wound Care Topical Primary Dressing Iodosorb Gel 10 (gm) Tube Discharge Instruction: Apply [name] wound bed as instructed Secondary Dressing Woven Gauze Sponge, Non-Sterile 4x4 in Discharge Instruction: Apply over primary dressing as directed. ABD Pad, 5x9 Discharge Instruction: Apply over primary dressing as directed. Secured With Elastic Bandage 4 inch (ACE bandage) Discharge Instruction: Secure with ACE bandage as directed. Kerlix Roll Sterile, 4.5x3.1 (in/yd) Discharge Instruction: Secure with Kerlix as directed. Paper Tape, 2x10 (in/yd) Discharge Instruction: Secure dressing with tape as directed. Compression Wrap Compression Stockings Add-Ons Electronic Signature(s) Signed: 08/20/2020 5:01:02 PM By: Linton Ham MD Signed: 08/20/2020 5:39:22 PM By: [name] Entered By: Linton Ham on 08/20/2020 09:31:18 -------------------------------------------------------------------------------- Multi-Disciplinary Care Plan Details Patient Name: Date of Service: [name], [place] 08/20/2020 8:30 A M Medical Record Number: [id number] Patient Account Number: [id number] Date of Birth/Sex: Treating RN: 08-Jul-1976 (45 y.o. [name] Primary Care Rondy Krupinski: [name] Other  Clinician: Referring Lurine Imel: Treating Eulonda Andalon/Extender: [name] in Treatment: 5 Active Inactive Abuse / Safety / Falls / Self Care Management Nursing Diagnoses: Potential for falls Goals: Patient/caregiver will verbalize/demonstrate measures taken [name] prevent injury and/or falls Date Initiated: 07/15/2020 Target Resolution Date: 10/02/2020 Goal Status: Active Interventions: Assess fall risk on admission and as needed Assess impairment of mobility on admission and as needed per policy Notes: Wound/Skin Impairment Nursing Diagnoses: Impaired tissue integrity Knowledge deficit related [name] ulceration/compromised skin integrity Goals: Patient/caregiver will verbalize understanding of skin care regimen Date Initiated: 07/15/2020 Target Resolution Date: 09/02/2020 Goal Status: Active Ulcer/skin breakdown will have a volume reduction of 30% by week 4 Date Initiated: 07/15/2020 Date Inactivated: 08/20/2020 Target Resolution Date: 09/03/2020 Goal Status: Unmet Unmet Reason: no major changes. Interventions: Assess patient/caregiver ability [name] obtain necessary supplies Assess patient/caregiver ability [name] perform ulcer/skin care regimen upon admission and as needed Assess ulceration(s) every visit Provide education on ulcer and skin care Treatment Activities: Skin care regimen initiated : 07/15/2020 Topical  wound management initiated : 07/15/2020 Notes: Electronic Signature(s) Signed: 08/20/2020 5:39:22 PM By: [name] Entered By: [name] on 08/20/2020 09:04:25 -------------------------------------------------------------------------------- Pain Assessment Details Patient Name: Date of Service: [name], [place] 08/20/2020 8:30 A M Medical Record Number: [id number] Patient Account Number: [id number] Date of Birth/Sex: Treating RN: 14-Dec-1975 (45 y.o. [name] Primary Care Mahalie Kanner: [name] Other Clinician: Referring Milena Liggett: Treating  Zygmunt Mcglinn/Extender: [name] in Treatment: 5 Active Problems Location of Pain Severity and Description of Pain Patient Has Paino Yes Site Locations Pain Location: Pain in Ulcers With Dressing Change: Yes Duration of the Pain. Constant / Intermittento Intermittent Rate the pain. Current Pain Level: 3 Character of Pain Describe the Pain: Throbbing Pain Management and Medication Current Pain Management: Medication: Yes Cold Application: No Rest: No Massage: No Activity: No T.MckenzieN.S.: No Heat Application: No Leg drop or elevation: No Is the Current Pain Management Adequate: Inadequate How does your wound impact your activities of daily livingo Sleep: No Bathing: No Appetite: No Relationship With Others: No Bladder Continence: No Emotions: No Bowel Continence: No Work: No Toileting: No Drive: No Dressing: No Hobbies: No Engineer, maintenance) Signed: 08/20/2020 5:28:50 PM By: [name] Hurst RN, BSN Entered By: [name] on 08/20/2020 08:42:21 -------------------------------------------------------------------------------- Patient/Caregiver Education Details Patient Name: Date of Service: Ciro Backer 1/13/2022andnbsp8:30 A M Medical Record Number: [id number] Patient Account Number: [id number] Date of Birth/Gender: Treating RN: 14-Apr-1976 (45 y.o. [name] Primary Care Physician: [name] Other Clinician: Referring Physician: Treating Physician/Extender: [name] in Treatment: 5 Education Assessment Education Provided [name]: Patient Education Topics Provided Wound/Skin Impairment: Handouts: Skin Care Do's and Dont's Methods: Explain/Verbal Responses: Reinforcements needed Electronic Signature(s) Signed: 08/20/2020 5:39:22 PM By: [name] Entered By: [name] on 08/20/2020 09:04:48 -------------------------------------------------------------------------------- Wound Assessment  Details Patient Name: Date of Service: [name], [place] 08/20/2020 8:30 A M Medical Record Number: [id number] Patient Account Number: [id number] Date of Birth/Sex: Treating RN: [birth year] (45 y.o. [name] Primary Care Girtha Kilgore: [name] Other Clinician: Referring Arloa Prak: Treating Keliah Harned/Extender: [name] in Treatment: 5 Wound Status Wound Number: 1 Primary Etiology: Pressure Ulcer Wound Location: Right Calcaneus Wound Status: Open Wounding Event: Pressure Injury Comorbid History: Asthma, Angina, Hypertension Date Acquired: 10/07/2019 Weeks Of Treatment: 5 Clustered Wound: No Wound Measurements Length: (cm) 7.8 Width: (cm) 2.7 Depth: (cm) 0.8 Area: (cm) 16.54 Volume: (cm) 13.232 % Reduction in Area: 47.7% % Reduction in Volume: 58.2% Epithelialization: Small (1-33%) Undermining: No Wound Description Classification: Category/Stage III Wound Margin: Distinct, outline attached Exudate Amount: Medium Exudate Type: Serosanguineous Exudate Color: red, brown Foul Odor After Cleansing: No Slough/Fibrino Yes Wound Bed Granulation Amount: Medium (34-66%) Exposed Structure Granulation Quality: Pink Fascia Exposed: No Necrotic Amount: Medium (34-66%) Fat Layer (Subcutaneous Tissue) Exposed: Yes Necrotic Quality: Adherent Slough Tendon Exposed: No Muscle Exposed: No Joint Exposed: No Bone Exposed: No Treatment Notes Wound #1 (Calcaneus) Wound Laterality: Right Cleanser Soap and Water Discharge Instruction: May shower and wash wound with dial antibacterial soap and water prior [name] dressing change. Peri-Wound Care Topical Primary Dressing Iodosorb Gel 10 (gm) Tube Discharge Instruction: Apply [name] wound bed as instructed Secondary Dressing Woven Gauze Sponge, Non-Sterile 4x4 in Discharge Instruction: Apply over primary dressing as directed. ABD Pad, 5x9 Discharge Instruction: Apply over primary dressing as directed. Secured  With Elastic Bandage 4 inch (ACE bandage) Discharge Instruction: Secure with ACE bandage as directed. Kerlix Roll Sterile, 4.5x3.1 (in/yd) Discharge Instruction: Secure  with Kerlix as directed. Paper Tape, 2x10 (in/yd) Discharge Instruction: Secure dressing with tape as directed. Compression Wrap Compression Stockings Add-Ons Electronic Signature(s) Signed: 08/20/2020 5:28:50 PM By: [name] Hurst RN, BSN Entered By: [name] on 08/20/2020 08:53:55 -------------------------------------------------------------------------------- Wound Assessment Details Patient Name: Date of Service: [name], [place] 08/20/2020 8:30 A M Medical Record Number: [id number] Patient Account Number: [id number] Date of Birth/Sex: Treating RN: [birth year] (45 y.o. [name] Primary Care Lainey Nelson: [name] Other Clinician: Referring Kerry-Anne Mezo: Treating Muranda Coye/Extender: [name] in Treatment: 5 Wound Status Wound Number: 2 Primary Etiology: Pressure Ulcer Wound Location: Left Calcaneus Wound Status: Open Wounding Event: Pressure Injury Comorbid History: Asthma, Angina, Hypertension Date Acquired: 10/07/2019 Weeks Of Treatment: 5 Clustered Wound: No Wound Measurements Length: (cm) 7.8 Width: (cm) 2.6 Depth: (cm) 0.6 Area: (cm) 15.928 Volume: (cm) 9.557 % Reduction in Area: 41.2% % Reduction in Volume: 64.7% Epithelialization: Small (1-33%) Tunneling: No Undermining: No Wound Description Classification: Category/Stage III Wound Margin: Distinct, outline attached Exudate Amount: Medium Exudate Type: Serosanguineous Exudate Color: red, brown Foul Odor After Cleansing: No Slough/Fibrino Yes Wound Bed Granulation Amount: Medium (34-66%) Exposed Structure Granulation Quality: Pink Fascia Exposed: No Necrotic Amount: Medium (34-66%) Fat Layer (Subcutaneous Tissue) Exposed: Yes Necrotic Quality: Adherent Slough Tendon Exposed: No Muscle Exposed:  No Joint Exposed: No Bone Exposed: No Treatment Notes Wound #2 (Calcaneus) Wound Laterality: Left Cleanser Soap and Water Discharge Instruction: May shower and wash wound with dial antibacterial soap and water prior [name] dressing change. Peri-Wound Care Topical Primary Dressing Iodosorb Gel 10 (gm) Tube Discharge Instruction: Apply [name] wound bed as instructed Secondary Dressing Woven Gauze Sponge, Non-Sterile 4x4 in Discharge Instruction: Apply over primary dressing as directed. ABD Pad, 5x9 Discharge Instruction: Apply over primary dressing as directed. Secured With Elastic Bandage 4 inch (ACE bandage) Discharge Instruction: Secure with ACE bandage as directed. Kerlix Roll Sterile, 4.5x3.1 (in/yd) Discharge Instruction: Secure with Kerlix as directed. Paper Tape, 2x10 (in/yd) Discharge Instruction: Secure dressing with tape as directed. Compression Wrap Compression Stockings Add-Ons Electronic Signature(s) Signed: 08/20/2020 5:28:50 PM By: [name] Hurst RN, BSN Entered By: [name] on 08/20/2020 08:54:15 -------------------------------------------------------------------------------- Vitals Details Patient Name: Date of Service: [name], [place]. 08/20/2020 8:30 A M Medical Record Number: [id number] Patient Account Number: [id number] Date of Birth/Sex: Treating RN: 08-Aug-1976 (45 y.o. [name] Primary Care Maryse Brierley: [name] Other Clinician: Referring Briena Swingler: Treating Keeton Kassebaum/Extender: [name] in Treatment: 5 Vital Signs Time Taken: 08:41 Temperature (F): 97.7 Height (in): 69 Pulse (bpm): 74 Weight (lbs): 280 Respiratory Rate (breaths/min): 18 Body Mass Index (BMI): 41.3 Blood Pressure (mmHg): 134/82 Reference Range: 80 - 120 mg / dl Electronic Signature(s) Signed: 08/20/2020 5:28:50 PM By: [name] Hurst RN, BSN Entered By: [name] on 08/20/2020 08:42:06

## 2020-08-27 ENCOUNTER — Ambulatory Visit (HOSPITAL_COMMUNITY)
Admission: RE | Admit: 2020-08-27 | Discharge: 2020-08-27 | Disposition: A | Discharge status: Home / Self Care | Payer: BC Managed Care – PPO | Source: Ambulatory Visit | Attending: Internal Medicine | Admitting: Internal Medicine

## 2020-08-27 ENCOUNTER — Other Ambulatory Visit: Payer: Self-pay

## 2020-08-27 ENCOUNTER — Encounter (HOSPITAL_BASED_OUTPATIENT_CLINIC_OR_DEPARTMENT_OTHER): Payer: BC Managed Care – PPO | Admitting: Internal Medicine

## 2020-08-27 DIAGNOSIS — L03115 Cellulitis of right lower limb: Secondary | ICD-10-CM | POA: Diagnosis not present

## 2020-08-27 DIAGNOSIS — L97519 Non-pressure chronic ulcer of other part of right foot with unspecified severity: Secondary | ICD-10-CM | POA: Diagnosis not present

## 2020-08-27 DIAGNOSIS — Z8616 Personal history of COVID-19: Secondary | ICD-10-CM | POA: Diagnosis not present

## 2020-08-27 DIAGNOSIS — S91001A Unspecified open wound, right ankle, initial encounter: Secondary | ICD-10-CM | POA: Diagnosis not present

## 2020-08-27 DIAGNOSIS — L89893 Pressure ulcer of other site, stage 3: Secondary | ICD-10-CM | POA: Insufficient documentation

## 2020-08-27 DIAGNOSIS — I10 Essential (primary) hypertension: Secondary | ICD-10-CM | POA: Diagnosis not present

## 2020-08-27 DIAGNOSIS — S91301A Unspecified open wound, right foot, initial encounter: Secondary | ICD-10-CM | POA: Diagnosis not present

## 2020-08-27 DIAGNOSIS — M6281 Muscle weakness (generalized): Secondary | ICD-10-CM | POA: Diagnosis not present

## 2020-08-27 MED ORDER — GADOBUTROL 1 MMOL/ML IV SOLN
10.0000 mL | Freq: Once | INTRAVENOUS | Status: AC | PRN
  Administered 2020-08-27: 10 mL via INTRAVENOUS

## 2020-08-27 NOTE — Progress Notes (Signed)
[name], [name] ([id number]) Visit Report for 08/27/2020 Arrival Information Details Patient Name: Date of Service: [name], [place] 08/27/2020 8:45 A M Medical Record Number: [id number] Patient Account Number: [id number] Date of Birth/Sex: Treating RN: 05-05-1976 (45 y.o. Ernestene [name] Primary Care Marijane Trower: [name] Other Clinician: Referring Ashaz Robling: Treating Roberto Hlavaty/Extender: [name] in Treatment: 6 Visit Information History Since Last Visit All ordered tests and consults were completed: No Patient Arrived: Wheel Chair Added or deleted any medications: No Arrival Time: 08:31 Any new allergies or adverse reactions: No Accompanied By: spouse Had a fall or experienced change in No Transfer Assistance: None activities of daily living that may affect Patient Identification Verified: Yes risk of falls: Secondary Verification Process Completed: Yes Signs or symptoms of abuse/neglect since No Patient Requires Transmission-Based Precautions: No last visito Patient Has Alerts: No Hospitalized since last visit: No Implantable device outside of the clinic No excluding cellular tissue based products placed in the center since last visit: Has Dressing in Place as Prescribed: Yes Has Footwear/Offloading in Place as Yes Prescribed: Left: Surgical Shoe with Pressure Relief Insole Right: Surgical Shoe with Pressure Relief Insole Pain Present Now: Yes Notes Mri scheduled for this evening Electronic Signature(s) Signed: 08/27/2020 4:58:08 PM By: Baruch Gouty RN, BSN Entered By: Baruch Gouty on 08/27/2020 08:32:30 -------------------------------------------------------------------------------- Clinic Level of Care Assessment Details Patient Name: Date of Service: [name], [place]. 08/27/2020 8:45 A M Medical Record Number: [id number] Patient Account Number: [id number] Date of Birth/Sex: Treating RN: 26-Aug-1975 (45 y.o. [name], [name] Primary  Care Tatum Massman: [name] Other Clinician: Referring Roy Tokarz: Treating Loic Hobin/Extender: [name] in Treatment: 6 Clinic Level of Care Assessment Items TOOL 4 Quantity Score X- 1 0 Use when only an EandM is performed on FOLLOW-UP visit ASSESSMENTS - Nursing Assessment / Reassessment X- 1 10 Reassessment of Co-morbidities (includes updates in patient status) X- 1 5 Reassessment of Adherence to Treatment Plan ASSESSMENTS - Wound and Skin A ssessment / Reassessment []  - 0 Simple Wound Assessment / Reassessment - one wound X- 2 5 Complex Wound Assessment / Reassessment - multiple wounds X- 1 10 Dermatologic / Skin Assessment (not related to wound area) ASSESSMENTS - Focused Assessment X- 2 5 Circumferential Edema Measurements - multi extremities X- 1 10 Nutritional Assessment / Counseling / Intervention []  - 0 Lower Extremity Assessment (monofilament, tuning fork, pulses) []  - 0 Peripheral Arterial Disease Assessment (using hand held doppler) ASSESSMENTS - Ostomy and/or Continence Assessment and Care []  - 0 Incontinence Assessment and Management []  - 0 Ostomy Care Assessment and Management (repouching, etc.) PROCESS - Coordination of Care []  - 0 Simple Patient / Family Education for ongoing care X- 1 20 Complex (extensive) Patient / Family Education for ongoing care X- 1 10 Staff obtains Programmer, systems, Records, T Results / Process Orders est []  - 0 Staff telephones HHA, Nursing Homes / Clarify orders / etc []  - 0 Routine Transfer to another Facility (non-emergent condition) []  - 0 Routine Hospital Admission (non-emergent condition) []  - 0 New Admissions / Biomedical engineer / Ordering NPWT Apligraf, etc. , []  - 0 Emergency Hospital Admission (emergent condition) []  - 0 Simple Discharge Coordination X- 1 15 Complex (extensive) Discharge Coordination PROCESS - Special Needs []  - 0 Pediatric / Minor Patient Management []  -  0 Isolation Patient Management []  - 0 Hearing / Language / Visual special needs []  - 0 Assessment of Community assistance (transportation, D/C planning, etc.) []  - 0 Additional assistance /  Altered mentation []  - 0 Support Surface(s) Assessment (bed, cushion, seat, etc.) INTERVENTIONS - Wound Cleansing / Measurement []  - 0 Simple Wound Cleansing - one wound X- 2 5 Complex Wound Cleansing - multiple wounds X- 1 5 Wound Imaging (photographs - any number of wounds) []  - 0 Wound Tracing (instead of photographs) []  - 0 Simple Wound Measurement - one wound X- 2 5 Complex Wound Measurement - multiple wounds INTERVENTIONS - Wound Dressings []  - 0 Small Wound Dressing one or multiple wounds X- 2 15 Medium Wound Dressing one or multiple wounds []  - 0 Large Wound Dressing one or multiple wounds []  - 0 Application of Medications - topical []  - 0 Application of Medications - injection INTERVENTIONS - Miscellaneous []  - 0 External ear exam []  - 0 Specimen Collection (cultures, biopsies, blood, body fluids, etc.) []  - 0 Specimen(s) / Culture(s) sent or taken to Lab for analysis []  - 0 Patient Transfer (multiple staff / Civil Service fast streamer / Similar devices) []  - 0 Simple Staple / Suture removal (25 or less) []  - 0 Complex Staple / Suture removal (26 or more) []  - 0 Hypo / Hyperglycemic Management (close monitor of Blood Glucose) []  - 0 Ankle / Brachial Index (ABI) - do not check if billed separately X- 1 5 Vital Signs Has the patient been seen at the hospital within the last three years: Yes Total Score: 160 Level Of Care: New/Established - Level 5 Electronic Signature(s) Signed: 08/27/2020 4:56:08 PM By: [name] Entered By: [name] on 08/27/2020 08:53:09 -------------------------------------------------------------------------------- Encounter Discharge Information Details Patient Name: Date of Service: [name], [name] 08/27/2020 8:45 A M Medical Record Number:  [id number] Patient Account Number: [id number] Date of Birth/Sex: Treating RN: 1976/04/29 (45 y.o. Ernestene [name] Primary Care Giliana Vantil: [name] Other Clinician: Referring Elyanah Farino: Treating Salaya Holtrop/Extender: [name] in Treatment: 6 Encounter Discharge Information Items Discharge Condition: Stable Ambulatory Status: Wheelchair Discharge Destination: Home Transportation: Private Auto Accompanied By: spouse Schedule Follow-up Appointment: Yes Clinical Summary of Care: Patient Declined Electronic Signature(s) Signed: 08/27/2020 4:58:08 PM By: Baruch Gouty RN, BSN Entered By: Baruch Gouty on 08/27/2020 09:06:54 -------------------------------------------------------------------------------- Lower Extremity Assessment Details Patient Name: Date of Service: [name], [place] 08/27/2020 8:45 A M Medical Record Number: [id number] Patient Account Number: [id number] Date of Birth/Sex: Treating RN: 01-09-1976 (45 y.o. Ernestene [name] Primary Care Ski Polich: [name] Other Clinician: Referring Daliana Leverett: Treating Marchell Froman/Extender: [name] in Treatment: 6 Edema Assessment Assessed: [name]: No] [name]: No] Edema: [Left: Yes] [Right: Yes] Calf Left: Right: Point of Measurement: 34 cm From Medial Instep 38 cm 40.5 cm Ankle Left: Right: Point of Measurement: 12 cm From Medial Instep 24 cm 25 cm Vascular Assessment Pulses: Dorsalis Pedis Palpable: [Left:Yes] [Right:Yes] Electronic Signature(s) Signed: 08/27/2020 4:58:08 PM By: Baruch Gouty RN, BSN Entered By: Baruch Gouty on 08/27/2020 08:36:32 -------------------------------------------------------------------------------- Multi Wound Chart Details Patient Name: Date of Service: [name], [name] 08/27/2020 8:45 A M Medical Record Number: [id number] Patient Account Number: [id number] Date of Birth/Sex: Treating RN: 1976-04-06 (44 y.o. [name] Primary  Care Shironda Kain: [name] Other Clinician: Referring Marine Lezotte: Treating Sherida Dobkins/Extender: [name] in Treatment: 6 Vital Signs Height(in): 69 Pulse(bpm): 96 Weight(lbs): 280 Blood Pressure(mmHg): 139/88 Body Mass Index(BMI): 41 Temperature(F): 98.1 Respiratory Rate(breaths/min): 18 Photos: [1:No Photos Right Calcaneus] [2:No Photos Left Calcaneus] [N/A:N/A N/A] Wound Location: [1:Pressure Injury] [2:Pressure Injury] [N/A:N/A] Wounding Event: [1:Pressure Ulcer] [2:Pressure Ulcer] [N/A:N/A] Primary Etiology: [1:Asthma, Angina,  Hypertension] [2:Asthma, Angina, Hypertension] [N/A:N/A] Comorbid History: [1:10/07/2019] [2:10/07/2019] [N/A:N/A] Date Acquired: [1:6] [2:6] [N/A:N/A] Weeks of Treatment: [1:Open] [2:Open] [N/A:N/A] Wound Status: [1:5.8x2.7x0.5] [2:7.4x2.8x0.5] [N/A:N/A] Measurements L x W x D (cm) [1:12.299] [2:16.273] [N/A:N/A] A (cm) : rea [1:6.15] [2:8.137] [N/A:N/A] Volume (cm) : [1:61.10%] [2:39.90%] [N/A:N/A] % Reduction in A rea: [1:80.60%] [2:70.00%] [N/A:N/A] % Reduction in Volume: [1:Category/Stage III] [2:Category/Stage III] [N/A:N/A] Classification: [1:Medium] [2:Medium] [N/A:N/A] Exudate A mount: [1:Serosanguineous] [2:Serosanguineous] [N/A:N/A] Exudate Type: [1:red, brown] [2:red, brown] [N/A:N/A] Exudate Color: [1:Thickened] [2:Indistinct, nonvisible] [N/A:N/A] Wound Margin: [1:Medium (34-66%)] [2:Medium (34-66%)] [N/A:N/A] Granulation A mount: [1:Pink] [2:Pink] [N/A:N/A] Granulation Quality: [1:Medium (34-66%)] [2:Medium (34-66%)] [N/A:N/A] Necrotic A mount: [1:Fat Layer (Subcutaneous Tissue): Yes Fat Layer (Subcutaneous Tissue): Yes N/A] Exposed Structures: [1:Fascia: No Tendon: No Muscle: No Joint: No Bone: No Small (1-33%)] [2:Fascia: No Tendon: No Muscle: No Joint: No Bone: No Small (1-33%)] [N/A:N/A] Treatment Notes Electronic Signature(s) Signed: 08/27/2020 4:36:20 PM By: Linton Ham MD Signed: 08/27/2020 4:56:08 PM  By: [name] Entered By: Linton Ham on 08/27/2020 08:57:42 -------------------------------------------------------------------------------- Multi-Disciplinary Care Plan Details Patient Name: Date of Service: [name], [name] 08/27/2020 8:45 A M Medical Record Number: [id number] Patient Account Number: [id number] Date of Birth/Sex: Treating RN: Feb 06, 1976 (45 y.o. [name] Primary Care Reannah Totten: [name] Other Clinician: Referring Khalil Szczepanik: Treating Daphane Odekirk/Extender: [name] in Treatment: 6 Active Inactive Abuse / Safety / Falls / Self Care Management Nursing Diagnoses: Potential for falls Goals: Patient/caregiver will verbalize/demonstrate measures taken to prevent injury and/or falls Date Initiated: 07/15/2020 Target Resolution Date: 10/02/2020 Goal Status: Active Interventions: Assess fall risk on admission and as needed Assess impairment of mobility on admission and as needed per policy Notes: Wound/Skin Impairment Nursing Diagnoses: Impaired tissue integrity Knowledge deficit related to ulceration/compromised skin integrity Goals: Patient/caregiver will verbalize understanding of skin care regimen Date Initiated: 07/15/2020 Target Resolution Date: 10/09/2020 Goal Status: Active Ulcer/skin breakdown will have a volume reduction of 30% by week 4 Date Initiated: 07/15/2020 Date Inactivated: 08/20/2020 Target Resolution Date: 09/03/2020 Goal Status: Unmet Unmet Reason: no major changes. Interventions: Assess patient/caregiver ability to obtain necessary supplies Assess patient/caregiver ability to perform ulcer/skin care regimen upon admission and as needed Assess ulceration(s) every visit Provide education on ulcer and skin care Treatment Activities: Skin care regimen initiated : 07/15/2020 Topical wound management initiated : 07/15/2020 Notes: Electronic Signature(s) Signed: 08/27/2020 4:56:08 PM By: [name] Entered  By: [name] on 08/27/2020 08:29:15 -------------------------------------------------------------------------------- Pain Assessment Details Patient Name: Date of Service: [name], [place]. 08/27/2020 8:45 A M Medical Record Number: [id number] Patient Account Number: [id number] Date of Birth/Sex: Treating RN: Oct 11, 1975 (45 y.o. Ernestene [name] Primary Care Birl Lobello: [name] Other Clinician: Referring Tarl Cephas: Treating Armentha Branagan/Extender: [name] in Treatment: 6 Active Problems Location of Pain Severity and Description of Pain Patient Has Paino Yes Site Locations Pain Location: Pain in Ulcers With Dressing Change: Yes Duration of the Pain. Constant / Intermittento Intermittent Rate the pain. Current Pain Level: 4 Worst Pain Level: 6 Least Pain Level: 0 Character of Pain Describe the Pain: Aching, Tender Pain Management and Medication Current Pain Management: Medication: Yes Is the Current Pain Management Adequate: Adequate How does your wound impact your activities of daily livingo Sleep: No Bathing: No Appetite: No Relationship With Others: No Bladder Continence: No Emotions: No Bowel Continence: No Hobbies: No Toileting: No Dressing: No Electronic Signature(s) Signed: 08/27/2020 4:58:08 PM By: Baruch Gouty RN, BSN Entered By: Baruch Gouty on 08/27/2020 08:33:17 --------------------------------------------------------------------------------  Patient/Caregiver Education Details Patient Name: Date of Service: [name] 1/20/2022andnbsp8:45 A M Medical Record Number: [id number] Patient Account Number: [id number] Date of Birth/Gender: Treating RN: Apr 02, 1976 (45 y.o. [name] Primary Care Physician: [name] Other Clinician: Referring Physician: Treating Physician/Extender: [name] in Treatment: 6 Education Assessment Education Provided To: Patient Education Topics  Provided Wound/Skin Impairment: Handouts: Skin Care Do's and Dont's Methods: Explain/Verbal Responses: Reinforcements needed Electronic Signature(s) Signed: 08/27/2020 4:56:08 PM By: [name] Entered By: [name] on 08/27/2020 08:29:36 -------------------------------------------------------------------------------- Wound Assessment Details Patient Name: Date of Service: [name], [place] 08/27/2020 8:45 A M Medical Record Number: [id number] Patient Account Number: [id number] Date of Birth/Sex: Treating RN: Jul 19, 1976 (45 y.o. Ernestene [name] Primary Care Zhamir Pirro: [name] Other Clinician: Referring Nyssa Sayegh: Treating Margo Lama/Extender: [name] in Treatment: 6 Wound Status Wound Number: 1 Primary Etiology: Pressure Ulcer Wound Location: Right Calcaneus Wound Status: Open Wounding Event: Pressure Injury Comorbid History: Asthma, Angina, Hypertension Date Acquired: 10/07/2019 Weeks Of Treatment: 6 Clustered Wound: No Wound Measurements Length: (cm) 5.8 Width: (cm) 2.7 Depth: (cm) 0.5 Area: (cm) 12.299 Volume: (cm) 6.15 % Reduction in Area: 61.1% % Reduction in Volume: 80.6% Epithelialization: Small (1-33%) Tunneling: No Undermining: No Wound Description Classification: Category/Stage III Wound Margin: Thickened Exudate Amount: Medium Exudate Type: Serosanguineous Exudate Color: red, brown Foul Odor After Cleansing: No Slough/Fibrino Yes Wound Bed Granulation Amount: Medium (34-66%) Exposed Structure Granulation Quality: Pink Fascia Exposed: No Necrotic Amount: Medium (34-66%) Fat Layer (Subcutaneous Tissue) Exposed: Yes Necrotic Quality: Adherent Slough Tendon Exposed: No Muscle Exposed: No Joint Exposed: No Bone Exposed: No Treatment Notes Wound #1 (Calcaneus) Wound Laterality: Right Cleanser Soap and Water Discharge Instruction: May shower and wash wound with dial antibacterial soap and water prior to dressing  change. Peri-Wound Care Topical Primary Dressing Iodosorb Gel 10 (gm) Tube Discharge Instruction: Apply to wound bed as instructed Secondary Dressing Woven Gauze Sponge, Non-Sterile 4x4 in Discharge Instruction: Apply over primary dressing as directed. ABD Pad, 5x9 Discharge Instruction: Apply over primary dressing as directed. Secured With Elastic Bandage 4 inch (ACE bandage) Discharge Instruction: Secure with ACE bandage as directed. Kerlix Roll Sterile, 4.5x3.1 (in/yd) Discharge Instruction: Secure with Kerlix as directed. Paper Tape, 2x10 (in/yd) Discharge Instruction: Secure dressing with tape as directed. Compression Wrap Compression Stockings Add-Ons Electronic Signature(s) Signed: 08/27/2020 4:58:08 PM By: Baruch Gouty RN, BSN Entered By: Baruch Gouty on 08/27/2020 08:39:10 -------------------------------------------------------------------------------- Wound Assessment Details Patient Name: Date of Service: [name], [place] 08/27/2020 8:45 A M Medical Record Number: [id number] Patient Account Number: [id number] Date of Birth/Sex: Treating RN: [birth year] (45 y.o. Ernestene [name] Primary Care Rachyl Wuebker: [name] Other Clinician: Referring Erla Bacchi: Treating Gabi Mcfate/Extender: [name] in Treatment: 6 Wound Status Wound Number: 2 Primary Etiology: Pressure Ulcer Wound Location: Left Calcaneus Wound Status: Open Wounding Event: Pressure Injury Comorbid History: Asthma, Angina, Hypertension Date Acquired: 10/07/2019 Weeks Of Treatment: 6 Clustered Wound: No Wound Measurements Length: (cm) 7.4 Width: (cm) 2.8 Depth: (cm) 0.5 Area: (cm) 16.273 Volume: (cm) 8.137 % Reduction in Area: 39.9% % Reduction in Volume: 70% Epithelialization: Small (1-33%) Tunneling: No Undermining: No Wound Description Classification: Category/Stage III Wound Margin: Indistinct, nonvisible Exudate Amount: Medium Exudate Type:  Serosanguineous Exudate Color: red, brown Foul Odor After Cleansing: No Slough/Fibrino Yes Wound Bed Granulation Amount: Medium (34-66%) Exposed Structure Granulation Quality: Pink Fascia Exposed: No Necrotic Amount: Medium (34-66%) Fat Layer (Subcutaneous Tissue) Exposed: Yes Necrotic Quality: Adherent  Slough Tendon Exposed: No Muscle Exposed: No Joint Exposed: No Bone Exposed: No Treatment Notes Wound #2 (Calcaneus) Wound Laterality: Left Cleanser Soap and Water Discharge Instruction: May shower and wash wound with dial antibacterial soap and water prior to dressing change. Peri-Wound Care Topical Primary Dressing Iodosorb Gel 10 (gm) Tube Discharge Instruction: Apply to wound bed as instructed Secondary Dressing Woven Gauze Sponge, Non-Sterile 4x4 in Discharge Instruction: Apply over primary dressing as directed. ABD Pad, 5x9 Discharge Instruction: Apply over primary dressing as directed. Secured With Elastic Bandage 4 inch (ACE bandage) Discharge Instruction: Secure with ACE bandage as directed. Kerlix Roll Sterile, 4.5x3.1 (in/yd) Discharge Instruction: Secure with Kerlix as directed. Paper Tape, 2x10 (in/yd) Discharge Instruction: Secure dressing with tape as directed. Compression Wrap Compression Stockings Add-Ons Electronic Signature(s) Signed: 08/27/2020 4:58:08 PM By: Baruch Gouty RN, BSN Entered By: Baruch Gouty on 08/27/2020 08:39:51 -------------------------------------------------------------------------------- [place] Details Patient Name: Date of Service: [name], [place] 08/27/2020 8:45 A M Medical Record Number: [id number] Patient Account Number: [id number] Date of Birth/Sex: Treating RN: 07-12-1976 (46 y.o. Ernestene [name] Primary Care Kaydin Karbowski: [name] Other Clinician: Referring Lanyia Jewel: Treating Farrell Broerman/Extender: [name] in Treatment: 6 Vital Signs Time Taken: 08:34 Temperature (F): 98.1 Height  (in): 69 Pulse (bpm): 96 Source: Stated Respiratory Rate (breaths/min): 18 Weight (lbs): 280 Blood Pressure (mmHg): 139/88 Source: Stated Reference Range: 80 - 120 mg / dl Body Mass Index (BMI): 41.3 Electronic Signature(s) Signed: 08/27/2020 4:58:08 PM By: Baruch Gouty RN, BSN Signed: 08/27/2020 4:58:08 PM By: Baruch Gouty RN, BSN Entered By: Baruch Gouty on 08/27/2020 08:35:20

## 2020-08-27 NOTE — Progress Notes (Signed)
[name] ([id number]) Visit Report for 08/27/2020 HPI Details Patient Name: Date of Service: [name], [place] 08/27/2020 8:45 A M Medical Record Number: [id number] Patient Account Number: [id number] Date of Birth/Sex: Treating RN: October 20, 1975 (45 y.o. [name] Primary Care Provider: [name] Other Clinician: Referring Provider: Treating Provider/Extender: [name] in Treatment: 6 History of Present Illness HPI Description: 07/15/2020 upon evaluation today patient presents for initial inspection here in our clinic concerning issues he has been having with the bottoms of his feet bilaterally. He states these actually occurred as wounds when he was hospitalized for 5 months secondary to Covid. He was apparently with tilting bed where he was in an upright position quite frequently and apparently this occurred in some way shape or form during that time. Fortunately there is no sign of active infection at this time. No fevers, chills, nausea, vomiting, or diarrhea. With that being said he still has substantial wounds on the plantar aspects of his feet Theragen require quite a bit of work to get these to heal. He has been using Santyl currently though that is been problematic both in receiving the medication as well as actually paid for it as it is become quite expensive. Prior to the experience with Covid the patient really did not have any major medical problems other than hypertension he does have some mild generalized weakness following the Covid experience. 07/22/2020 on evaluation today patient appears to be doing okay in regard to his foot ulcers I feel like the wound beds are showing signs of better improvement that I do believe the Iodoflex is helping in this regard. With that being said he does have a lot of drainage currently and this is somewhat blue/green in nature which is consistent with Pseudomonas. I do think a culture today would be appropriate  for [place] to evaluate and see if that is indeed the case I would likely start him on antibiotic orally as well he is not allergic to Cipro knows of no issues he has had in the past 12/21; patient was admitted to the clinic earlier this month with bilateral presumed pressure ulcers on the bottom of his feet apparently related to excessive pressure from a tilt table arrangement in the intensive care unit. Patient relates this to being on ECMO but I am not really sure that is exactly related to that. I must say I have never seen anything like this. He has fairly extensive full-thickness wounds extending from his heel towards his midfoot mostly centered laterally. There is already been some healing distally. He does not appear to have an arterial issue. He has been using gentamicin to the wound surfaces with Iodoflex to help with ongoing debridement 1/6; this is a patient with pressure ulcers on the bottom of his feet related to excessive pressure from a standing position in the intensive care unit. He is complaining of a lot of pain in the right heel. He is not a diabetic. 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 We have been using Iodoflex to help prepare the surfaces of both wounds for an advanced treatment product. He is nonambulatory spending most of his time in a wheelchair I have asked him not to propel the wheelchair with his heels 1/13; in general his wounds look better not much surface area change we have been using Iodoflex as of last week. I did an x-ray of the right heel as the patient was complaining of pain. I  had some thoughts about a stress fracture perhaps Achilles tendon problems however what it showed was erosive changes along the inferior aspect of the calcaneus he now has a MRI booked for 1/20. 1/20; in general his wounds continue to be better. Some improvement in the large narrow areas proximally in his foot. He is still complaining of pain in the right heel  and tenderness in certain areas of this wound. His MRI is tonight. I am not just looking for osteomyelitis that was brought up on the x-ray I am wondering about stress fractures, tendon ruptures etc. He has no such findings on the left. Also noteworthy is that the patient had critical illness neuropathy and some of the discomfort may be actual improvement in nerve function I am just not sure. These wounds were initially in the setting of severe critical illness related to COVID-19. He was put in a standing position. He may have also been on pressors at the point contributing to tissue ischemia. By his description at some point these wounds were grossly necrotic extending proximally up into the Achilles part of his heel. I do not know that I have ever really seen pictures of them like this although they may exist in epic We have ordered Tri layer Oasis. I am trying to stimulate some granulation in these areas. This is of course assuming the MRI is negative for infection Electronic Signature(s) Signed: 08/27/2020 4:36:20 PM By: Linton Ham MD Entered By: Linton Ham on 08/27/2020 09:01:51 -------------------------------------------------------------------------------- Physical Exam Details Patient Name: Date of Service: [name], [place] 08/27/2020 8:45 A M Medical Record Number: [id number] Patient Account Number: [id number] Date of Birth/Sex: Treating RN: [birth year] (45 y.o. [name] Primary Care Provider: [name] Other Clinician: Referring Provider: Treating Provider/Extender: [name] in Treatment: 6 Constitutional Sitting or standing Blood Pressure is within target range for patient.. Pulse regular and within target range for patient.Marland Kitchen Respirations regular, non-labored and within target range.. Temperature is normal and within the target range for the patient.Marland Kitchen Appears in no distress. Cardiovascular Pedal pulses are palpable. Notes Wound exam;  the wounds are largest on the plantar aspect of both heels with a narrow 4 parts going towards the distal part of the plantar foot actually have come down in length. There is no major undermining. I carefully probed this area there is no exposed bone but on the right especially medially there is tenderness but no purulence. No surrounding soft tissue erythema. Electronic Signature(s) Signed: 08/27/2020 4:36:20 PM By: Linton Ham MD Entered By: Linton Ham on 08/27/2020 09:25:11 -------------------------------------------------------------------------------- Physician Orders Details Patient Name: Date of Service: [name], [place] 08/27/2020 8:45 A M Medical Record Number: [id number] Patient Account Number: [id number] Date of Birth/Sex: Treating RN: [birth year] (45 y.o. [name] Primary Care Provider: [name] Other Clinician: Referring Provider: Treating Provider/Extender: [name] in Treatment: 6 Verbal / Phone Orders: No Diagnosis Coding ICD-10 Coding Code Description [phone] Pressure ulcer of other site, stage 3 M62.81 Muscle weakness (generalized) I10 Essential (primary) hypertension Follow-up Appointments Return Appointment in 1 week. Bathing/ Shower/ Hygiene May shower and wash wound with soap and water. - wash wounds with dial antibacterial soap Edema Control - Lymphedema / SCD / Other Bilateral Lower Extremities Elevate legs to the level of the heart or above for 30 minutes daily and/or when sitting, a frequency of: Avoid standing for long periods of time. Off-Loading Other: - keep pressure off of the bottom of your  feet Wound Treatment Wound #1 - Calcaneus Wound Laterality: Right Cleanser: Soap and Water 3 x Per Week/30 Days Discharge Instructions: May shower and wash wound with dial antibacterial soap and water prior to dressing change. Prim Dressing: Iodosorb Gel 10 (gm) Tube (Generic) 3 x Per Week/30 Days ary Discharge  Instructions: Apply to wound bed as instructed Secondary Dressing: Woven Gauze Sponge, Non-Sterile 4x4 in (Generic) 3 x Per Week/30 Days Discharge Instructions: Apply over primary dressing as directed. Secondary Dressing: ABD Pad, 5x9 3 x Per Week/30 Days Discharge Instructions: Apply over primary dressing as directed. Secured With: Elastic Bandage 4 inch (ACE bandage) 3 x Per Week/30 Days Discharge Instructions: Secure with ACE bandage as directed. Secured With: [employer], 4.5x3.1 (in/yd) (Generic) 3 x Per Week/30 Days Discharge Instructions: Secure with Kerlix as directed. Secured With: Paper Tape, 2x10 (in/yd) (Generic) 3 x Per Week/30 Days Discharge Instructions: Secure dressing with tape as directed. Wound #2 - Calcaneus Wound Laterality: Left Cleanser: Soap and Water 3 x Per Week/30 Days Discharge Instructions: May shower and wash wound with dial antibacterial soap and water prior to dressing change. Prim Dressing: Iodosorb Gel 10 (gm) Tube (Generic) 3 x Per Week/30 Days ary Discharge Instructions: Apply to wound bed as instructed Secondary Dressing: Woven Gauze Sponge, Non-Sterile 4x4 in (Generic) 3 x Per Week/30 Days Discharge Instructions: Apply over primary dressing as directed. Secondary Dressing: ABD Pad, 5x9 3 x Per Week/30 Days Discharge Instructions: Apply over primary dressing as directed. Secured With: Elastic Bandage 4 inch (ACE bandage) 3 x Per Week/30 Days Discharge Instructions: Secure with ACE bandage as directed. Secured With: [employer], 4.5x3.1 (in/yd) (Generic) 3 x Per Week/30 Days Discharge Instructions: Secure with Kerlix as directed. Secured With: Paper Tape, 2x10 (in/yd) (Generic) 3 x Per Week/30 Days Discharge Instructions: Secure dressing with tape as directed. Electronic Signature(s) Signed: 08/27/2020 4:36:20 PM By: Linton Ham MD Signed: 08/27/2020 4:56:08 PM By: [name] Entered By: [name] on 08/27/2020  08:50:44 -------------------------------------------------------------------------------- Problem List Details Patient Name: Date of Service: [name], [place] 08/27/2020 8:45 A M Medical Record Number: [id number] Patient Account Number: [id number] Date of Birth/Sex: Treating RN: Dec 19, 1975 (45 y.o. [name] Primary Care Provider: [name] Other Clinician: Referring Provider: Treating Provider/Extender: [name] in Treatment: 6 Active Problems ICD-10 Encounter Code Description Active Date MDM Diagnosis [phone] Pressure ulcer of other site, stage 3 07/15/2020 No Yes M62.81 Muscle weakness (generalized) 07/15/2020 No Yes I10 Essential (primary) hypertension 07/15/2020 No Yes Inactive Problems Resolved Problems Electronic Signature(s) Signed: 08/27/2020 4:36:20 PM By: Linton Ham MD Entered By: Linton Ham on 08/27/2020 08:57:36 -------------------------------------------------------------------------------- Progress Note Details Patient Name: Date of Service: [name], [place] 08/27/2020 8:45 A M Medical Record Number: [id number] Patient Account Number: [id number] Date of Birth/Sex: Treating RN: 1975-12-14 (45 y.o. [name] Primary Care Provider: [name] Other Clinician: Referring Provider: Treating Provider/Extender: [name] in Treatment: 6 Subjective History of Present Illness (HPI) 07/15/2020 upon evaluation today patient presents for initial inspection here in our clinic concerning issues he has been having with the bottoms of his feet bilaterally. He states these actually occurred as wounds when he was hospitalized for 5 months secondary to Covid. He was apparently with tilting bed where he was in an upright position quite frequently and apparently this occurred in some way shape or form during that time. Fortunately there is no sign of active infection at this time. No fevers, chills,  nausea,  vomiting, or diarrhea. With that being said he still has substantial wounds on the plantar aspects of his feet Theragen require quite a bit of work to get these to heal. He has been using Santyl currently though that is been problematic both in receiving the medication as well as actually paid for it as it is become quite expensive. Prior to the experience with Covid the patient really did not have any major medical problems other than hypertension he does have some mild generalized weakness following the Covid experience. 07/22/2020 on evaluation today patient appears to be doing okay in regard to his foot ulcers I feel like the wound beds are showing signs of better improvement that I do believe the Iodoflex is helping in this regard. With that being said he does have a lot of drainage currently and this is somewhat blue/green in nature which is consistent with Pseudomonas. I do think a culture today would be appropriate for [place] to evaluate and see if that is indeed the case I would likely start him on antibiotic orally as well he is not allergic to Cipro knows of no issues he has had in the past 12/21; patient was admitted to the clinic earlier this month with bilateral presumed pressure ulcers on the bottom of his feet apparently related to excessive pressure from a tilt table arrangement in the intensive care unit. Patient relates this to being on ECMO but I am not really sure that is exactly related to that. I must say I have never seen anything like this. He has fairly extensive full-thickness wounds extending from his heel towards his midfoot mostly centered laterally. There is already been some healing distally. He does not appear to have an arterial issue. He has been using gentamicin to the wound surfaces with Iodoflex to help with ongoing debridement 1/6; this is a patient with pressure ulcers on the bottom of his feet related to excessive pressure from a standing position in the intensive  care unit. He is complaining of a lot of pain in the right heel. He is not a diabetic. 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 We have been using Iodoflex to help prepare the surfaces of both wounds for an advanced treatment product. He is nonambulatory spending most of his time in a wheelchair I have asked him not to propel the wheelchair with his heels 1/13; in general his wounds look better not much surface area change we have been using Iodoflex as of last week. I did an x-ray of the right heel as the patient was complaining of pain. I had some thoughts about a stress fracture perhaps Achilles tendon problems however what it showed was erosive changes along the inferior aspect of the calcaneus he now has a MRI booked for 1/20. 1/20; in general his wounds continue to be better. Some improvement in the large narrow areas proximally in his foot. He is still complaining of pain in the right heel and tenderness in certain areas of this wound. His MRI is tonight. I am not just looking for osteomyelitis that was brought up on the x-ray I am wondering about stress fractures, tendon ruptures etc. He has no such findings on the left. Also noteworthy is that the patient had critical illness neuropathy and some of the discomfort may be actual improvement in nerve function I am just not sure. These wounds were initially in the setting of severe critical illness related to COVID-19. He was put in a  standing position. He may have also been on pressors at the point contributing to tissue ischemia. By his description at some point these wounds were grossly necrotic extending proximally up into the Achilles part of his heel. I do not know that I have ever really seen pictures of them like this although they may exist in epic We have ordered Tri layer Oasis. I am trying to stimulate some granulation in these areas. This is of course assuming the MRI is negative for  infection Objective Constitutional Sitting or standing Blood Pressure is within target range for patient.. Pulse regular and within target range for patient.Marland Kitchen Respirations regular, non-labored and within target range.. Temperature is normal and within the target range for the patient.Marland Kitchen Appears in no distress. Vitals Time Taken: 8:34 AM, Height: 69 in, Source: Stated, Weight: 280 lbs, Source: Stated, BMI: 41.3, Temperature: 98.1 F, Pulse: 96 bpm, Respiratory Rate: 18 breaths/min, Blood Pressure: 139/88 mmHg. Cardiovascular Pedal pulses are palpable. General Notes: Wound exam; the wounds are largest on the plantar aspect of both heels with a narrow 4 parts going towards the distal part of the plantar foot actually have come down in length. There is no major undermining. I carefully probed this area there is no exposed bone but on the right especially medially there is tenderness but no purulence. No surrounding soft tissue erythema. Integumentary (Hair, Skin) Wound #1 status is Open. Original cause of wound was Pressure Injury. The wound is located on the Right Calcaneus. The wound measures 5.8cm length x 2.7cm width x 0.5cm depth; 12.299cm^2 area and 6.15cm^3 volume. There is Fat Layer (Subcutaneous Tissue) exposed. There is no tunneling or undermining noted. There is a medium amount of serosanguineous drainage noted. The wound margin is thickened. There is medium (34-66%) pink granulation within the wound bed. There is a medium (34-66%) amount of necrotic tissue within the wound bed including Adherent Slough. Wound #2 status is Open. Original cause of wound was Pressure Injury. The wound is located on the Left Calcaneus. The wound measures 7.4cm length x 2.8cm width x 0.5cm depth; 16.273cm^2 area and 8.137cm^3 volume. There is Fat Layer (Subcutaneous Tissue) exposed. There is no tunneling or undermining noted. There is a medium amount of serosanguineous drainage noted. The wound margin is  indistinct and nonvisible. There is medium (34-66%) pink granulation within the wound bed. There is a medium (34-66%) amount of necrotic tissue within the wound bed including Adherent Slough. Assessment Active Problems ICD-10 Pressure ulcer of other site, stage 3 Muscle weakness (generalized) Essential (primary) hypertension Plan Follow-up Appointments: Return Appointment in 1 week. Bathing/ Shower/ Hygiene: May shower and wash wound with soap and water. - wash wounds with dial antibacterial soap Edema Control - Lymphedema / SCD / Other: Elevate legs to the level of the heart or above for 30 minutes daily and/or when sitting, a frequency of: Avoid standing for long periods of time. Off-Loading: Other: - keep pressure off of the bottom of your feet WOUND #1: - Calcaneus Wound Laterality: Right Cleanser: Soap and Water 3 x Per Week/30 Days Discharge Instructions: May shower and wash wound with dial antibacterial soap and water prior to dressing change. Prim Dressing: Iodosorb Gel 10 (gm) Tube (Generic) 3 x Per Week/30 Days ary Discharge Instructions: Apply to wound bed as instructed Secondary Dressing: Woven Gauze Sponge, Non-Sterile 4x4 in (Generic) 3 x Per Week/30 Days Discharge Instructions: Apply over primary dressing as directed. Secondary Dressing: ABD Pad, 5x9 3 x Per Week/30 Days Discharge Instructions: Apply over  primary dressing as directed. Secured With: Elastic Bandage 4 inch (ACE bandage) 3 x Per Week/30 Days Discharge Instructions: Secure with ACE bandage as directed. Secured With: [employer], 4.5x3.1 (in/yd) (Generic) 3 x Per Week/30 Days Discharge Instructions: Secure with Kerlix as directed. Secured With: Paper T ape, 2x10 (in/yd) (Generic) 3 x Per Week/30 Days Discharge Instructions: Secure dressing with tape as directed. WOUND #2: - Calcaneus Wound Laterality: Left Cleanser: Soap and Water 3 x Per Week/30 Days Discharge Instructions: May shower and wash  wound with dial antibacterial soap and water prior to dressing change. Prim Dressing: Iodosorb Gel 10 (gm) Tube (Generic) 3 x Per Week/30 Days ary Discharge Instructions: Apply to wound bed as instructed Secondary Dressing: Woven Gauze Sponge, Non-Sterile 4x4 in (Generic) 3 x Per Week/30 Days Discharge Instructions: Apply over primary dressing as directed. Secondary Dressing: ABD Pad, 5x9 3 x Per Week/30 Days Discharge Instructions: Apply over primary dressing as directed. Secured With: Elastic Bandage 4 inch (ACE bandage) 3 x Per Week/30 Days Discharge Instructions: Secure with ACE bandage as directed. Secured With: [employer], 4.5x3.1 (in/yd) (Generic) 3 x Per Week/30 Days Discharge Instructions: Secure with Kerlix as directed. Secured With: Paper T ape, 2x10 (in/yd) (Generic) 3 x Per Week/30 Days Discharge Instructions: Secure dressing with tape as directed. 1. I have continued with the Iodoflex for another week 2. Based on the MRI of the right heel he may need treatment for underlying osteomyelitis which may mean a trip to see podiatry and infectious disease. He may qualify for HBO but I have not discussed this with him. I am somewhat doubtful that the MRI is going to show osteomyelitis I wonder about stress fracture, tendon injury or perhaps even improvement in his underlying critical illness neuropathy as a reason for his continued discomfort/dysesthesia 3. If the MRI is negative for infection I will probably move to a silver collagen-based dressing. Based on his co-pay I am going to try [place]. I would like to try to get a significant amount of this on both wounds. The idea here is to improve the granulation Electronic Signature(s) Signed: 08/27/2020 4:36:20 PM By: Linton Ham MD Entered By: Linton Ham on 08/27/2020 09:27:16 -------------------------------------------------------------------------------- SuperBill Details Patient Name: Date of Service: [name], [place]. 08/27/2020 Medical Record Number: [id number] Patient Account Number: [id number] Date of Birth/Sex: Treating RN: [birth year] (45 y.o. [name] Primary Care Provider: [name] Other Clinician: Referring Provider: Treating Provider/Extender: [name] in Treatment: 6 Diagnosis Coding ICD-10 Codes Code Description [phone] Pressure ulcer of other site, stage 3 M62.81 Muscle weakness (generalized) I10 Essential (primary) hypertension Facility Procedures CPT4 Code: 01007121 Description: [phone] - WOUND CARE VISIT-LEV 5 EST PT Modifier: Quantity: 1 Physician Procedures : CPT4 Code Description Modifier [id number] - WC PHYS LEVEL 3 - EST PT ICD-10 Diagnosis Description L89.893 Pressure ulcer of other site, stage 3 M62.81 Muscle weakness (generalized) I10 Essential (primary) hypertension Quantity: 1 Electronic Signature(s) Signed: 08/27/2020 4:36:20 PM By: Linton Ham MD Entered By: Linton Ham on 08/27/2020 09:27:56

## 2020-08-31 ENCOUNTER — Ambulatory Visit: Payer: BC Managed Care – PPO | Admitting: Internal Medicine

## 2020-08-31 ENCOUNTER — Other Ambulatory Visit: Payer: Self-pay

## 2020-08-31 ENCOUNTER — Telehealth: Payer: Self-pay

## 2020-08-31 ENCOUNTER — Encounter: Payer: Self-pay | Admitting: Internal Medicine

## 2020-08-31 VITALS — BP 144/83 | HR 76 | Temp 97.7°F

## 2020-08-31 DIAGNOSIS — M8610 Other acute osteomyelitis, unspecified site: Secondary | ICD-10-CM | POA: Insufficient documentation

## 2020-08-31 DIAGNOSIS — Z8616 Personal history of COVID-19: Secondary | ICD-10-CM

## 2020-08-31 DIAGNOSIS — A4901 Methicillin susceptible Staphylococcus aureus infection, unspecified site: Secondary | ICD-10-CM | POA: Diagnosis not present

## 2020-08-31 HISTORY — DX: Methicillin susceptible Staphylococcus aureus infection, unspecified site: A49.01

## 2020-08-31 HISTORY — DX: Other acute osteomyelitis, unspecified site: M86.10

## 2020-08-31 MED ORDER — CEPHALEXIN 500 MG PO CAPS: 500.0000 mg | ORAL_CAPSULE | Freq: Four times a day (QID) | ORAL | 0 refills | Status: DC

## 2020-08-31 NOTE — Patient Instructions (Addendum)
Thank you for coming to see me today. It was a pleasure seeing you.  To Do: Marland Kitchen Get lab work done today . Referral for PICC line and IV antibiotics placed . Referral placed to see Dr Amalia Hailey again . Imaging study for evaluation of bloodflow in your legs . Take the oral antibiotic I sent to your pharmacy until getting PICC line placed, then stop taking the pills after starting IV antibiotics  If you have any questions or concerns, please do not hesitate to call the office at [phone].  Take Care,   Jule Ser, DO

## 2020-08-31 NOTE — Progress Notes (Addendum)
[facility]  Reason for Consult: Osteomyelitis  Referring Provider: Dr Dellia Nims   HPI:    [name] is a 45 y.o. male who presents to clinic for further evaluation of osteomyelitis.     He has a history of MSSA right septic arthritis in 2019 treated with IV to PO antibiotics and more recently had severe COVID pneumonia in early 2020 where he was hospitalized for approximately 5 months.  During this time developed bilateral heel ulcers.  Status post OR in August 2021 with Dr Amalia Hailey for debridement and application of wound matrix graft.  OR cultures at that time grew MSSA.  He has been following at the wound center recently.  MRI done 08/27/20 shows findings consistent with right calcaneal ostoemyelitis and a large soft tissue ulcer with surrounding cellulitis.  There was no organized rim enhancing fluid collection.  He is here now to discuss further treatment options.  He has no history of diabetes or PAD that he is aware of.  He completed an antibiotic outpatient last week but does not recall the name but it appears to likely have been Cipro based on wound care records  Patient's Medications  New Prescriptions   CEPHALEXIN (KEFLEX) 500 MG CAPSULE    Take 1 capsule (500 mg total) by mouth 4 (four) times daily for 14 days.  Previous Medications   ALBUTEROL (VENTOLIN HFA) 108 (90 BASE) MCG/ACT INHALER    TAKE 2 PUFFS BY MOUTH EVERY 6 HOURS AS NEEDED FOR WHEEZE OR SHORTNESS OF BREATH   AMLODIPINE (NORVASC) 2.5 MG TABLET    Take 1 tablet (2.5 mg total) by mouth daily.   CADEXOMER IODINE (IODOSORB) 0.9 % GEL    Apply 1 application topically as directed. With dressing changes.   CETIRIZINE (ZYRTEC) 10 MG TABLET    Take 1 tablet (10 mg total) by mouth daily.   DULOXETINE (CYMBALTA) 60 MG CAPSULE    Take 1 capsule (60 mg total) by mouth daily.   GENTAMICIN OINTMENT (GARAMYCIN) 0.1 %    Apply topically daily. With dressing changes   METOPROLOL TARTRATE (LOPRESSOR) 25  MG TABLET    TAKE 1 TABLET BY MOUTH TWICE A DAY   MONTELUKAST (SINGULAIR) 10 MG TABLET    Take 10 mg by mouth daily.   OXYCODONE-ACETAMINOPHEN (PERCOCET) 5-325 MG TABLET    Take 1 tablet by mouth every 8 (eight) hours as needed for severe pain.   PANTOPRAZOLE (PROTONIX) 40 MG TABLET    Take 1 tablet (40 mg total) by mouth daily.   SYMBICORT 160-4.5 MCG/ACT INHALER    Inhale 2 puffs into the lungs daily as needed (cough or wheeze). Inhale two puffs twice daily   TRAZODONE (DESYREL) 100 MG TABLET    Take 1-2 tablets (100-200 mg total) by mouth at bedtime.   TRIAMCINOLONE (NASACORT) 55 MCG/ACT AERO NASAL INHALER    Place 2 sprays into the nose daily.  Modified Medications   No medications on file  Discontinued Medications   CITALOPRAM (CELEXA) 20 MG TABLET    TAKE 1 TABLET BY MOUTH EVERY DAY   CLONAZEPAM (KLONOPIN) 1 MG TABLET    Take 1 mg by mouth at bedtime.      Past Medical History:  Diagnosis Date  . Anginal pain ([place])    with covid  . Anxiety   . Asthma   . Dyspnea   . GERD (gastroesophageal reflux disease)   . Headache   . History of kidney  stones    LEFT URETERAL STONE  . HTN (hypertension)   . Pancreatitis 2018   GALLBALDDER SLUDGE CAUSED ISSUED RESOLVED  . Pneumonia 07/2019   covid    Social History   Tobacco Use  . Smoking status: Never Smoker  . Smokeless tobacco: Never Used  Vaping Use  . Vaping Use: Never used  Substance Use Topics  . Alcohol use: Yes    Comment: rarely 2-3 times a year  . Drug use: No    Family History  Problem Relation Age of Onset  . Asthma Brother   . Diabetes Father   . Hypertension Father   . Diabetes Paternal Grandmother   . Hypertension Paternal Grandmother   . Migraines Mother   . GER disease Mother   . Pancreatic cancer Maternal Grandmother   . Heart disease Maternal Grandfather     No Known Allergies  Review of Systems  Constitutional: Negative for chills and fever.  Respiratory: Negative.   Cardiovascular:  Negative.   Musculoskeletal: Positive for joint pain.  Skin: Negative.       OBJECTIVE:    Vitals:   08/31/20 0909  BP: (!) 144/83  Pulse: 76  Temp: 97.7 F (36.5 C)  TempSrc: Oral     There is no height or weight on file to calculate BMI.  Physical Exam Constitutional:      Comments: No acute distress, sitting in wheel chair.  With his wife.  Tearful and anxious while discussing treatment options for calcaneal infection.   Pulmonary:     Effort: Pulmonary effort is normal. No respiratory distress.  Musculoskeletal:     Right lower leg: No edema.     Left lower leg: No edema.  Skin:    General: Skin is warm and dry.     Comments: Right lower extremity with changes consistent with PAD.   Neurological:     General: No focal deficit present.     Mental Status: He is oriented to person, place, and time.         Labs and Microbiology:  CBC Latest Ref Rng & Units 08/31/2020 03/17/2020 12/02/2019  WBC 3.8 - 10.8 Thousand/uL 7.7 7.3 8.4  Hemoglobin 13.2 - 17.1 g/dL 16.0 15.8 13.2  Hematocrit 38.5 - 50.0 % 47.3 51.2 42.7  Platelets 140 - 400 Thousand/uL 138(L) 170 178   CMP Latest Ref Rng & Units 08/31/2020 03/17/2020 12/02/2019  Glucose 65 - 99 mg/dL 129(H) 124(H) 98  BUN 7 - 25 mg/dL 20 19 33(H)  Creatinine 0.60 - 1.35 mg/dL 1.10 1.21 0.93  Sodium 135 - 146 mmol/L 140 140 137  Potassium 3.5 - 5.3 mmol/L 4.2 4.3 4.4  Chloride 98 - 110 mmol/L 103 103 98  CO2 20 - 32 mmol/L 28 24 29   Calcium 8.6 - 10.3 mg/dL 9.1 9.5 9.7  Total Protein 6.1 - 8.1 g/dL 7.3 - 7.9  Total Bilirubin 0.2 - 1.2 mg/dL 0.3 - 0.5  Alkaline Phos 38 - 126 U/L - - 76  AST 10 - 40 U/L 14 - 13(L)  ALT 9 - 46 U/L 11 - 15     Imaging: IMPRESSION: 1. Large plantar soft tissue ulceration underlying the posterior calcaneus with surrounding cellulitis. No organized or rim enhancing fluid collection. 2. Prominent bone marrow edema and enhancement within the plantar and lateral aspects of the calcaneal  tuberosity favored to represent early acute osteomyelitis. 3. Prominent intramuscular edema within the plantar foot musculature suggesting a nonspecific myositis. 4. Edema and enhancement in  the region of the distal posterior tibial vein at the level of the medial malleolus and plantar hindfoot, which may reflect a superficial thrombophlebitis. 5. Mild distal Achilles tendinosis.   ASSESSMENT & PLAN:    History of COVID-19 History of severe COVID pneumonia resulting in 5 month hospitalization and rehab stay.  Developed bilateral heel wounds during this time and now with right heel ulcer that has not healed and MRI showing calcaneal osteomyelitis.  He did receive his Pfizer vaccine in July 2021.   MSSA (methicillin susceptible Staphylococcus aureus) infection He has a history of shoulder septic arthritis with MSSA in 2019 and OR cultures from his heel debridement in August 2021 also grew MSSA.  PLAN: . His regimen for current infection will provide adequate MSSA coverage   Acute osteomyelitis ([place]) He has a long standing heel wound that developed during prolonged hospitalization last year.  Underwent debridement and graft matrix placement in August 2021 with overall improvement in appearance/size of wound per his report.  OR cultures at that time grew MSSA.  He was still having pain in his foot which prompted his wound care provider to obtain an MRI which unfortunately confirms the diagnosis of calcaneal osteomyelitis.  I discussed with patient that this type of infection can be very difficult to eradicate and will require more than just IV antibiotics.  He will additionally require adequate debridement, wound care, off loading, glycemic control, adequate blood flow, etc.  Even with these aggressive measures sometimes patients require below knee amputation.  Patient became understandably upset at this possibility and wants to be as aggressive as possible at limb salvage.  PLAN: . Plan for  at least 6 weeks of IV antibiotics.  PICC to be placed 9/28.  Will start PO therapy until PICC placed as a bridge . Will then plan for vancomycin and ceftriaxone.  I considered cefazolin alone given history of August OR culture with MSSA, however, given his desire for limb salvage, I think providing more broad coverage is appropriate to ensure coverage for other organisms is not missed since there are no new/updated cultures . Discussed with our pharmacist, plan for loading dose vancomycin 2542m x 1 then 1gm q8h . ABI to assess adequate blood flow.  Referral to Vascular surgery if abnormal . Discussed with patient regarding seeing podiatry again vs second opinion with orthopedic surgery. Patient prefers podiatry at this time.  Refer back to podiatry to ensure adequate debridement . Check baseline CBC, CMP, ESR, CRP.  Also check A1c to ensure no diabetes . RTC 4 weeks.  Hopefully we will be able to treat him through this infection and salvage his limb given the difficult year he has had  ADDENDUM 2:22 PM 09/01/2020: After discussing with pharmacy and financial coverage for antibiotics, daptomycin and vancomycin is comparable in cost.  Given patients size and need for TID vancomycin dosing I feel that daptomycin once daily would be preferable and pose less risk for nephrotoxicity.  Will do daptomycin 853mkg daily (1,00093mand ceftriaxone 2gm daily x 6 weeks from PICC placement.  Will update home health orders and include weekly CK.    Orders Placed This Encounter  Procedures  . IRPBratenahlFT >5 YRS INC IMG GUIDE    Standing Status:   Future    Standing Expiration Date:   08/31/2021    Order Specific Question:   Reason for Exam (SYMPTOM  OR DIAGNOSIS REQUIRED)    Answer:   need for IV abx    Order Specific  Question:   Preferred Imaging Location?    Answer:   [facility] PANEL WITH GFR  . CBC  . HgB A1c  . Sedimentation rate  . C-reactive protein  .  Ambulatory referral to Podiatry    Referral Priority:   Routine    Referral Type:   Consultation    Referral Reason:   Specialty Services Required    Requested Specialty:   Podiatry    Number of Visits Requested:   [facility] 09/01/2020, 2:26 PM

## 2020-08-31 NOTE — Telephone Encounter (Signed)
Per MD called [facility] for appointments. Patient needs appt for ABI and picc placement. Scheduled appt for ABI on 1/25 at 10 AM. Picc line is scheduled for 1/28 at 9 AM.  Relayed information to patient and his spouse. No questions at this time. Patient will need first dose of antbx at Short stay. Will fax orders for [facility] once completed by MD.  Patient will call office later today for phone number for vascular.  [facility] V[facility]: P: [phone]

## 2020-08-31 NOTE — Assessment & Plan Note (Signed)
History of severe COVID pneumonia resulting in 5 month hospitalization and rehab stay.  Developed bilateral heel wounds during this time and now with right heel ulcer that has not healed and MRI showing calcaneal osteomyelitis.  He did receive his Pfizer vaccine in July 2021.

## 2020-08-31 NOTE — Assessment & Plan Note (Addendum)
He has a long standing heel wound that developed during prolonged hospitalization last year.  Underwent debridement and graft matrix placement in August 2021 with overall improvement in appearance/size of wound per his report.  OR cultures at that time grew MSSA.  He was still having pain in his foot which prompted his wound care provider to obtain an MRI which unfortunately confirms the diagnosis of calcaneal osteomyelitis.  I discussed with patient that this type of infection can be very difficult to eradicate and will require more than just IV antibiotics.  He will additionally require adequate debridement, wound care, off loading, glycemic control, adequate blood flow, etc.  Even with these aggressive measures sometimes patients require below knee amputation.  Patient became understandably upset at this possibility and wants to be as aggressive as possible at limb salvage.  PLAN: . Plan for at least 6 weeks of IV antibiotics.  PICC to be placed 9/28.  Will start PO therapy until PICC placed as a bridge . Will then plan for vancomycin and ceftriaxone.  I considered cefazolin alone given history of August OR culture with MSSA, however, given his desire for limb salvage, I think providing more broad coverage is appropriate to ensure coverage for other organisms is not missed since there are no new/updated cultures . Discussed with our pharmacist, plan for loading dose vancomycin 2552m x 1 then 1gm q8h . ABI to assess adequate blood flow.  Referral to Vascular surgery if abnormal . Discussed with patient regarding seeing podiatry again vs second opinion with orthopedic surgery. Patient prefers podiatry at this time.  Refer back to podiatry to ensure adequate debridement . Check baseline CBC, CMP, ESR, CRP.  Also check A1c to ensure no diabetes . RTC 4 weeks.  Hopefully we will be able to treat him through this infection and salvage his limb given the difficult year he has had  ADDENDUM 2:22 PM  09/01/2020: After discussing with pharmacy and financial coverage for antibiotics, daptomycin and vancomycin is comparable in cost.  Given patients size and need for TID vancomycin dosing I feel that daptomycin once daily would be preferable and pose less risk for nephrotoxicity.  Will do daptomycin 850mkg daily (1,00067mand ceftriaxone 2gm daily x 6 weeks from PICC placement.  Will update home health orders and include weekly CK.

## 2020-08-31 NOTE — Assessment & Plan Note (Signed)
He has a history of shoulder septic arthritis with MSSA in 2019 and OR cultures from his heel debridement in August 2021 also grew MSSA.  PLAN: . His regimen for current infection will provide adequate MSSA coverage

## 2020-09-01 ENCOUNTER — Ambulatory Visit (HOSPITAL_COMMUNITY)
Admission: RE | Admit: 2020-09-01 | Discharge: 2020-09-01 | Disposition: A | Discharge status: Home / Self Care | Payer: BC Managed Care – PPO | Source: Ambulatory Visit | Attending: Internal Medicine | Admitting: Internal Medicine

## 2020-09-01 DIAGNOSIS — M8610 Other acute osteomyelitis, unspecified site: Secondary | ICD-10-CM | POA: Insufficient documentation

## 2020-09-01 DIAGNOSIS — A4901 Methicillin susceptible Staphylococcus aureus infection, unspecified site: Secondary | ICD-10-CM | POA: Insufficient documentation

## 2020-09-01 DIAGNOSIS — Z8616 Personal history of COVID-19: Secondary | ICD-10-CM | POA: Diagnosis not present

## 2020-09-01 LAB — COMPLETE METABOLIC PANEL WITH GFR
AG Ratio: 1.1 (calc) (ref 1.0–2.5)
ALT: 11 U/L (ref 9–46)
AST: 14 U/L (ref 10–40)
Albumin: 3.9 g/dL (ref 3.6–5.1)
Alkaline phosphatase (APISO): 109 U/L (ref 36–130)
BUN: 20 mg/dL (ref 7–25)
CO2: 28 mmol/L (ref 20–32)
Calcium: 9.1 mg/dL (ref 8.6–10.3)
Chloride: 103 mmol/L (ref 98–110)
Creat: 1.1 mg/dL (ref 0.60–1.35)
GFR, Est African American: 93 mL/min/{1.73_m2} (ref >=60)
GFR, Est Non African American: 80 mL/min/{1.73_m2} (ref >=60)
Globulin: 3.4 g/dL (calc) (ref 1.9–3.7)
Glucose, Bld: 129 mg/dL — ABNORMAL HIGH (ref 65–99)
Potassium: 4.2 mmol/L (ref 3.5–5.3)
Sodium: 140 mmol/L (ref 135–146)
Total Bilirubin: 0.3 mg/dL (ref 0.2–1.2)
Total Protein: 7.3 g/dL (ref 6.1–8.1)

## 2020-09-01 LAB — CBC
HCT: 47.3 % (ref 38.5–50.0)
Hemoglobin: 16 g/dL (ref 13.2–17.1)
MCH: 28.3 pg (ref 27.0–33.0)
MCHC: 33.8 g/dL (ref 32.0–36.0)
MCV: 83.6 fL (ref 80.0–100.0)
MPV: 13 fL — ABNORMAL HIGH (ref 7.5–12.5)
Platelets: 138 10*3/uL — ABNORMAL LOW (ref 140–400)
RBC: 5.66 10*6/uL (ref 4.20–5.80)
RDW: 12.4 % (ref 11.0–15.0)
WBC: 7.7 10*3/uL (ref 3.8–10.8)

## 2020-09-01 LAB — HEMOGLOBIN A1C
Hgb A1c MFr Bld: 5.7 % of total Hgb — ABNORMAL HIGH (ref <5.7)
Mean Plasma Glucose: 117 mg/dL
eAG (mmol/L): 6.5 mmol/L

## 2020-09-01 LAB — SEDIMENTATION RATE: Sed Rate: 17 mm/h — ABNORMAL HIGH (ref 0–15)

## 2020-09-01 LAB — C-REACTIVE PROTEIN: CRP: 14.5 mg/L — ABNORMAL HIGH (ref <8.0)

## 2020-09-01 NOTE — Telephone Encounter (Signed)
Orders faxed to [facility] and [facility]. Received confirmation that fax went through.

## 2020-09-01 NOTE — Telephone Encounter (Addendum)
Received call from [facility] at [name]. She states patient's daily copay will be about $94.45 per day until he meets the rest of his deductible which is about $600. After that, the patient will be responsible for 20% of the cost which will be about $18.88 per day. RN relayed this information to the patient, he is agreeable saying "whatever we have to do to get it done." RN let Stanton Kidney at [facility] know that patient is agreeable to the cost. Per Stanton Kidney, no prior authorization is needed.   Beryle Flock, RN

## 2020-09-01 NOTE — Telephone Encounter (Signed)
Dr. Juleen China would like to change the previous order of vancomycin to daptomycin. The patient's regimen will now be ceftriaxone and daptomycin.   RN spoke with Stanton Kidney at [facility], the copay per day will be about $101.93 and then drop to about $20 per day after the patient's deductible of $600 is met. RN relayed this information to the patient, he is okay to proceed with the new regimen.  RN faxed new orders to [facility] and made [facility] aware that the orders have changed from previously. Also faxed new orders to short stay and called [place] to notify her of the change. Both faxes came back with receipt of successful transmission.   RN also let patient know per Dr. Juleen China that his labs from yesterday don't show anything concerning and that his vascular study showed good blood flow. Also relayed to him that Dr. Juleen China only wants him taking the PO keflex until he starts the IV antibiotics.Patient verbalized understanding and RN answered all questions. Advised patient to let us know if he has anymore questions.   Per Stanton Kidney at [facility] will be taking care of the patient.   Beryle Flock, RN

## 2020-09-01 NOTE — Telephone Encounter (Signed)
Per Dr. Juleen China, he would like to see if 1,08m IV daptomycin daily would be cheaper than vancomycin for the patient. RN spoke to [place] [facility] and get back to [place]   MBeryle Flock RN

## 2020-09-02 ENCOUNTER — Telehealth: Payer: Self-pay | Admitting: Allergy & Immunology

## 2020-09-02 ENCOUNTER — Other Ambulatory Visit: Payer: Self-pay

## 2020-09-02 ENCOUNTER — Ambulatory Visit (INDEPENDENT_AMBULATORY_CARE_PROVIDER_SITE_OTHER): Payer: BC Managed Care – PPO | Admitting: Podiatry

## 2020-09-02 DIAGNOSIS — L97522 Non-pressure chronic ulcer of other 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: Secondary | ICD-10-CM

## 2020-09-02 DIAGNOSIS — L97512 Non-pressure chronic ulcer of other part of right foot with fat layer exposed: Secondary | ICD-10-CM

## 2020-09-02 DIAGNOSIS — M86171 Other acute osteomyelitis, right ankle and foot: Secondary | ICD-10-CM | POA: Diagnosis not present

## 2020-09-02 NOTE — Progress Notes (Signed)
HPI: 45 y.o. male presenting today for follow-up evaluation of bilateral wounds to the weightbearing surfaces of the bilateral feet.  Patient states that most recently he had an MRI of his right calcaneus and was diagnosed with early stages of osteomyelitis  Past Medical History:  Diagnosis Date  . Anginal pain ([place])    with covid  . Anxiety   . Asthma   . Dyspnea   . GERD (gastroesophageal reflux disease)   . Headache   . History of kidney stones    LEFT URETERAL STONE  . HTN (hypertension)   . Pancreatitis 2018   GALLBALDDER SLUDGE CAUSED ISSUED RESOLVED  . Pneumonia 07/2019   covid    Physical Exam: General: The patient is alert and oriented x3 in no acute distress.  Dermatology: Full-thickness pressure ulcers noted to the bilateral weightbearing surfaces of the feet across the entire sole of the foot.  Wound matrix grafts have completely absorbed/disintegrated into the tissues.  Measurements of the wounds are approximately the same measuring approximately 6.0x4.0 x 1.0 cm (LxWxD). To the noted ulcerations there is some intermittent granulation tissue throughout the wound.  There is no exposed bone muscle tendon ligament or joint, with exception of some deeper underlying plantar fascial ligament to the right heel area.  There is also some increased serous drainage noted from this area which does appear to be improved since last visit.  No malodor noted.  No purulence noted.  Periwound integrity is intact.  Vascular: Palpable pedal pulses bilaterally.  There is some minimal edema edema noted. Capillary refill within normal limits.  Neurological: Epicritic and protective threshold grossly intact bilaterally.   Musculoskeletal Exam: Range of motion within normal limits to all pedal and ankle joints bilateral. Muscle strength 5/5 in all groups bilateral.   MRI impression 08/27/2020: 1. Large plantar soft tissue ulceration underlying the posterior calcaneus with surrounding  cellulitis. No organized or rim enhancing fluid collection. 2. Prominent bone marrow edema and enhancement within the plantar and lateral aspects of the calcaneal tuberosity favored to represent early acute osteomyelitis. 3. Prominent intramuscular edema within the plantar foot musculature suggesting a nonspecific myositis. 4. Edema and enhancement in the region of the distal posterior tibial vein at the level of the medial malleolus and plantar hindfoot, which may reflect a superficial thrombophlebitis. 5. Mild distal Achilles tendinosis.  Assessment: 1.  Full-thickness pressure ulcers bilateral feet 2.  Pain in bilateral feet 3.  History of COVID-19 hospitalization.  Discharge 12/04/2019 4.  Status post debridement with application of skin substitute bilateral feet.  DOS: 03/27/2020 5.  Acute early stages of osteomyelitis right calcaneus  Plan of Care:  1. Patient evaluated.   2.  Patient has appointment this Friday, 09/04/2020 for PICC line placement.  His IV antibiotics is going to be managed by Dr. Juleen China with infectious disease.  His management and care is greatly appreciated 3.  Continue follow-up appointments with [facility] 4.  Return to clinic at 8 weeks after completion of the IV antibiotics  Edrick Kins, DPM [facility]  Dr. Edrick Kins, DPM    [address] [place] [address]                Office [phone]  Fax [phone]

## 2020-09-02 NOTE — Telephone Encounter (Signed)
[employer], Verdene Lennert, called and she said they need the value of the pulmonary function test and graph from patient's visit on 05/28/19. She said they might be a bar code on it and if so, it can be sent that way, otherwise when you call back, you will get their call center and they will give you instructions on how to send this.

## 2020-09-03 ENCOUNTER — Other Ambulatory Visit (HOSPITAL_COMMUNITY): Payer: Self-pay | Admitting: *Deleted

## 2020-09-03 ENCOUNTER — Telehealth: Payer: Self-pay | Admitting: Podiatry

## 2020-09-03 ENCOUNTER — Encounter (HOSPITAL_BASED_OUTPATIENT_CLINIC_OR_DEPARTMENT_OTHER): Payer: BC Managed Care – PPO | Admitting: Internal Medicine

## 2020-09-03 DIAGNOSIS — M6281 Muscle weakness (generalized): Secondary | ICD-10-CM | POA: Diagnosis not present

## 2020-09-03 DIAGNOSIS — L89893 Pressure ulcer of other site, stage 3: Secondary | ICD-10-CM | POA: Diagnosis not present

## 2020-09-03 DIAGNOSIS — Z8616 Personal history of COVID-19: Secondary | ICD-10-CM | POA: Diagnosis not present

## 2020-09-03 DIAGNOSIS — I10 Essential (primary) hypertension: Secondary | ICD-10-CM | POA: Diagnosis not present

## 2020-09-03 NOTE — Telephone Encounter (Signed)
Attempted to call long wait time could not get anywhere

## 2020-09-03 NOTE — Telephone Encounter (Signed)
Patient has requested refill on pain meds, Please Advise

## 2020-09-03 NOTE — Telephone Encounter (Signed)
I forwarded this message to [place]. She will take care of it.

## 2020-09-03 NOTE — Progress Notes (Signed)
[name], [name] ([id number]) Visit Report for 09/03/2020 Arrival Information Details Patient Name: Date of Service: [name], [place] 09/03/2020 8:45 A M Medical Record Number: [id number] Patient Account Number: [id number] Date of Birth/Sex: Treating RN: Nov 27, 1975 (45 y.o. [name] Primary Care Sanaz Scarlett: [name] Other Clinician: Referring Abriel Hattery: Treating Dewaine Morocho/Extender: [name] in Treatment: 7 Visit Information History Since Last Visit Added or deleted any medications: No Patient Arrived: Wheel Chair Any new allergies or adverse reactions: No Arrival Time: 08:41 Had a fall or experienced change in No Accompanied By: wife activities of daily living that may affect Transfer Assistance: None risk of falls: Patient Identification Verified: Yes Signs or symptoms of abuse/neglect since last visito No Secondary Verification Process Completed: Yes Hospitalized since last visit: No Patient Requires Transmission-Based Precautions: No Implantable device outside of the clinic excluding No Patient Has Alerts: No cellular tissue based products placed in the center since last visit: Has Dressing in Place as Prescribed: Yes Pain Present Now: Yes Electronic Signature(s) Signed: 09/03/2020 5:48:06 PM By: Levan Hurst RN, BSN Entered By: Levan Hurst on 09/03/2020 08:43:06 -------------------------------------------------------------------------------- Clinic Level of Care Assessment Details Patient Name: Date of Service: [name], [name] 09/03/2020 8:45 A M Medical Record Number: [id number] Patient Account Number: [id number] Date of Birth/Sex: Treating RN: 20-Mar-1976 (45 y.o. [name], [name] Primary Care Trenell Moxey: [name] Other Clinician: Referring Jove Beyl: Treating Felcia Huebert/Extender: [name] in Treatment: 7 Clinic Level of Care Assessment Items TOOL 4 Quantity Score X- 1 0 Use when only an EandM is  performed on FOLLOW-UP visit ASSESSMENTS - Nursing Assessment / Reassessment X- 1 10 Reassessment of Co-morbidities (includes updates in patient status) X- 1 5 Reassessment of Adherence to Treatment Plan ASSESSMENTS - Wound and Skin A ssessment / Reassessment []  - 0 Simple Wound Assessment / Reassessment - one wound X- 2 5 Complex Wound Assessment / Reassessment - multiple wounds X- 1 10 Dermatologic / Skin Assessment (not related to wound area) ASSESSMENTS - Focused Assessment X- 2 5 Circumferential Edema Measurements - multi extremities X- 1 10 Nutritional Assessment / Counseling / Intervention []  - 0 Lower Extremity Assessment (monofilament, tuning fork, pulses) []  - 0 Peripheral Arterial Disease Assessment (using hand held doppler) ASSESSMENTS - Ostomy and/or Continence Assessment and Care []  - 0 Incontinence Assessment and Management []  - 0 Ostomy Care Assessment and Management (repouching, etc.) PROCESS - Coordination of Care []  - 0 Simple Patient / Family Education for ongoing care X- 1 20 Complex (extensive) Patient / Family Education for ongoing care X- 1 10 Staff obtains Programmer, systems, Records, T Results / Process Orders est []  - 0 Staff telephones HHA, Nursing Homes / Clarify orders / etc []  - 0 Routine Transfer to another Facility (non-emergent condition) []  - 0 Routine Hospital Admission (non-emergent condition) []  - 0 New Admissions / Biomedical engineer / Ordering NPWT Apligraf, etc. , []  - 0 Emergency Hospital Admission (emergent condition) []  - 0 Simple Discharge Coordination X- 1 15 Complex (extensive) Discharge Coordination PROCESS - Special Needs []  - 0 Pediatric / Minor Patient Management []  - 0 Isolation Patient Management []  - 0 Hearing / Language / Visual special needs []  - 0 Assessment of Community assistance (transportation, D/C planning, etc.) []  - 0 Additional assistance / Altered mentation []  - 0 Support Surface(s) Assessment  (bed, cushion, seat, etc.) INTERVENTIONS - Wound Cleansing / Measurement []  - 0 Simple Wound Cleansing - one wound X- 2 5 Complex Wound Cleansing -  multiple wounds X- 1 5 Wound Imaging (photographs - any number of wounds) []  - 0 Wound Tracing (instead of photographs) []  - 0 Simple Wound Measurement - one wound X- 2 5 Complex Wound Measurement - multiple wounds INTERVENTIONS - Wound Dressings []  - 0 Small Wound Dressing one or multiple wounds X- 2 15 Medium Wound Dressing one or multiple wounds []  - 0 Large Wound Dressing one or multiple wounds []  - 0 Application of Medications - topical []  - 0 Application of Medications - injection INTERVENTIONS - Miscellaneous []  - 0 External ear exam []  - 0 Specimen Collection (cultures, biopsies, blood, body fluids, etc.) []  - 0 Specimen(s) / Culture(s) sent or taken to Lab for analysis []  - 0 Patient Transfer (multiple staff / Civil Service fast streamer / Similar devices) []  - 0 Simple Staple / Suture removal (25 or less) []  - 0 Complex Staple / Suture removal (26 or more) []  - 0 Hypo / Hyperglycemic Management (close monitor of Blood Glucose) []  - 0 Ankle / Brachial Index (ABI) - do not check if billed separately X- 1 5 Vital Signs Has the patient been seen at the hospital within the last three years: Yes Total Score: 160 Level Of Care: New/Established - Level 5 Electronic Signature(s) Signed: 09/03/2020 5:42:01 PM By: [name] Entered By: [name] on 09/03/2020 09:23:08 -------------------------------------------------------------------------------- Encounter Discharge Information Details Patient Name: Date of Service: [name], [name] 09/03/2020 8:45 A M Medical Record Number: [id number] Patient Account Number: [id number] Date of Birth/Sex: Treating RN: 1976/06/13 (45 y.o. [name] Primary Care Lonette Stevison: [name] Other Clinician: Referring Domenique Quest: Treating Eliyana Pagliaro/Extender: [name]  in Treatment: 7 Encounter Discharge Information Items Discharge Condition: Stable Ambulatory Status: Wheelchair Discharge Destination: Home Transportation: Private Auto Accompanied By: spouse Schedule Follow-up Appointment: Yes Clinical Summary of Care: Patient Declined Electronic Signature(s) Signed: 09/03/2020 5:45:53 PM By: Baruch Gouty RN, BSN Entered By: Baruch Gouty on 09/03/2020 09:50:23 -------------------------------------------------------------------------------- Lower Extremity Assessment Details Patient Name: Date of Service: [name], [place] 09/03/2020 8:45 A M Medical Record Number: [id number] Patient Account Number: [id number] Date of Birth/Sex: Treating RN: Oct 09, 1975 (45 y.o. [name] Primary Care Genita Nilsson: [name] Other Clinician: Referring Rolin Schult: Treating Myishia Kasik/Extender: [name] in Treatment: 7 Edema Assessment Assessed: [name]: No] [name]: No] Edema: [Left: Yes] [Right: Yes] Calf Left: Right: Point of Measurement: 34 cm From Medial Instep 38 cm 40.5 cm Ankle Left: Right: Point of Measurement: 12 cm From Medial Instep 24 cm 25 cm Vascular Assessment Pulses: Dorsalis Pedis Palpable: [Left:Yes] [Right:Yes] Electronic Signature(s) Signed: 09/03/2020 5:48:06 PM By: Levan Hurst RN, BSN Entered By: Levan Hurst on 09/03/2020 08:57:51 -------------------------------------------------------------------------------- Multi Wound Chart Details Patient Name: Date of Service: [name], [place] 09/03/2020 8:45 A M Medical Record Number: [id number] Patient Account Number: [id number] Date of Birth/Sex: Treating RN: 18-Sep-1975 (44 y.o. [name] Primary Care Juwana Thoreson: [name] Other Clinician: Referring Chela Sutphen: Treating Ezri Fanguy/Extender: [name] in Treatment: 7 Vital Signs Height(in): 69 Pulse(bpm): 80 Weight(lbs): 280 Blood Pressure(mmHg): 131/71 Body Mass  Index(BMI): 41 Temperature(F): 98.1 Respiratory Rate(breaths/min): 18 Photos: [1:No Photos Right Calcaneus] [2:No Photos Left Calcaneus] [N/A:N/A N/A] Wound Location: [1:Pressure Injury] [2:Pressure Injury] [N/A:N/A] Wounding Event: [1:Pressure Ulcer] [2:Pressure Ulcer] [N/A:N/A] Primary Etiology: [1:Asthma, Angina, Hypertension] [2:Asthma, Angina, Hypertension] [N/A:N/A] Comorbid History: [1:10/07/2019] [2:10/07/2019] [N/A:N/A] Date Acquired: [1:7] [2:7] [N/A:N/A] Weeks of Treatment: [1:Open] [2:Open] [N/A:N/A] Wound Status: [1:6.4x2.4x0.7] [2:7.7x2.5x0.7] [N/A:N/A] Measurements L x W x D (cm) [1:12.064] [  2:15.119] [N/A:N/A] A (cm) : rea [1:8.445] [2:10.583] [N/A:N/A] Volume (cm) : [1:61.80%] [2:44.20%] [N/A:N/A] % Reduction in A rea: [1:73.30%] [2:60.90%] [N/A:N/A] % Reduction in Volume: [1:Category/Stage III] [2:Category/Stage III] [N/A:N/A] Classification: [1:Medium] [2:Medium] [N/A:N/A] Exudate A mount: [1:Serosanguineous] [2:Serosanguineous] [N/A:N/A] Exudate Type: [1:red, brown] [2:red, brown] [N/A:N/A] Exudate Color: [1:Thickened] [2:Indistinct, nonvisible] [N/A:N/A] Wound Margin: [1:Medium (34-66%)] [2:Medium (34-66%)] [N/A:N/A] Granulation A mount: [1:Pink] [2:Pink] [N/A:N/A] Granulation Quality: [1:Medium (34-66%)] [2:Medium (34-66%)] [N/A:N/A] Necrotic A mount: [1:Fat Layer (Subcutaneous Tissue): Yes Fat Layer (Subcutaneous Tissue): Yes N/A] Exposed Structures: [1:Fascia: No Tendon: No Muscle: No Joint: No Bone: No Small (1-33%)] [2:Fascia: No Tendon: No Muscle: No Joint: No Bone: No Small (1-33%)] [N/A:N/A] Treatment Notes Wound #1 (Calcaneus) Wound Laterality: Right Cleanser Soap and Water Discharge Instruction: May shower and wash wound with dial antibacterial soap and water prior to dressing change. Peri-Wound Care Topical Primary Dressing Promogran Prisma Matrix, 4.34 (sq in) (silver collagen) Discharge Instruction: Moisten collagen with saline or  hydrogel Secondary Dressing Woven Gauze Sponge, Non-Sterile 4x4 in Discharge Instruction: ensure to fold and apply up to the Collagen to ensure it keeps the Collagen in place next to wound bed. Apply over primary dressing as directed. ABD Pad, 5x9 Discharge Instruction: Apply over primary dressing as directed. Secured With Elastic Bandage 4 inch (ACE bandage) Discharge Instruction: Secure with ACE bandage as directed. Kerlix Roll Sterile, 4.5x3.1 (in/yd) Discharge Instruction: Secure with Kerlix as directed. Paper Tape, 2x10 (in/yd) Discharge Instruction: Secure dressing with tape as directed. Compression Wrap Compression Stockings Add-Ons Wound #2 (Calcaneus) Wound Laterality: Left Cleanser Soap and Water Discharge Instruction: May shower and wash wound with dial antibacterial soap and water prior to dressing change. Peri-Wound Care Topical Primary Dressing Promogran Prisma Matrix, 4.34 (sq in) (silver collagen) Discharge Instruction: Moisten collagen with saline or hydrogel Secondary Dressing Woven Gauze Sponge, Non-Sterile 4x4 in Discharge Instruction: ensure to fold and apply up to the Collagen to ensure it keeps the Collagen in place next to wound bed. Apply over primary dressing as directed. ABD Pad, 5x9 Discharge Instruction: Apply over primary dressing as directed. Secured With Elastic Bandage 4 inch (ACE bandage) Discharge Instruction: Secure with ACE bandage as directed. Kerlix Roll Sterile, 4.5x3.1 (in/yd) Discharge Instruction: Secure with Kerlix as directed. Paper Tape, 2x10 (in/yd) Discharge Instruction: Secure dressing with tape as directed. Compression Wrap Compression Stockings Add-Ons Electronic Signature(s) Signed: 09/03/2020 4:56:03 PM By: Linton Ham MD Signed: 09/03/2020 5:42:01 PM By: [name] Entered By: Linton Ham on 09/03/2020  10:04:52 -------------------------------------------------------------------------------- Multi-Disciplinary Care Plan Details Patient Name: Date of Service: [name], [place] 09/03/2020 8:45 A M Medical Record Number: [id number] Patient Account Number: [id number] Date of Birth/Sex: Treating RN: 1976/04/20 (45 y.o. [name] Primary Care Rhylen Shaheen: [name] Other Clinician: Referring Kyoko Elsea: Treating Namon Villarin/Extender: [name] in Treatment: 7 Active Inactive Abuse / Safety / Falls / Self Care Management Nursing Diagnoses: Potential for falls Goals: Patient/caregiver will verbalize/demonstrate measures taken to prevent injury and/or falls Date Initiated: 07/15/2020 Target Resolution Date: 11/06/2020 Goal Status: Active Interventions: Assess fall risk on admission and as needed Assess impairment of mobility on admission and as needed per policy Notes: Wound/Skin Impairment Nursing Diagnoses: Impaired tissue integrity Knowledge deficit related to ulceration/compromised skin integrity Goals: Patient/caregiver will verbalize understanding of skin care regimen Date Initiated: 07/15/2020 Target Resolution Date: 12/04/2020 Goal Status: Active Ulcer/skin breakdown will have a volume reduction of 30% by week 4 Date Initiated: 07/15/2020 Date Inactivated: 08/20/2020 Target Resolution Date: 09/03/2020 Goal Status: Unmet Unmet  Reason: no major changes. Interventions: Assess patient/caregiver ability to obtain necessary supplies Assess patient/caregiver ability to perform ulcer/skin care regimen upon admission and as needed Assess ulceration(s) every visit Provide education on ulcer and skin care Treatment Activities: Skin care regimen initiated : 07/15/2020 Topical wound management initiated : 07/15/2020 Notes: Electronic Signature(s) Signed: 09/03/2020 5:42:01 PM By: [name] Entered By: [name] on 09/03/2020  09:12:50 -------------------------------------------------------------------------------- Pain Assessment Details Patient Name: Date of Service: [name], [place]. 09/03/2020 8:45 A M Medical Record Number: [id number] Patient Account Number: [id number] Date of Birth/Sex: Treating RN: August 12, 1975 (45 y.o. [name] Primary Care Omaira Mellen: [name] Other Clinician: Referring Marykathryn Carboni: Treating Yazmyne Sara/Extender: [name] in Treatment: 7 Active Problems Location of Pain Severity and Description of Pain Patient Has Paino Yes Site Locations Pain Location: Pain in Ulcers With Dressing Change: Yes Duration of the Pain. Constant / Intermittento Intermittent Rate the pain. Current Pain Level: 4 Character of Pain Describe the Pain: Tender Pain Management and Medication Current Pain Management: Medication: Yes Cold Application: No Rest: No Massage: No Activity: No T.E.N.S.: No Heat Application: No Leg drop or elevation: No Is the Current Pain Management Adequate: Adequate How does your wound impact your activities of daily livingo Sleep: No Bathing: No Appetite: No Relationship With Others: No Bladder Continence: No Emotions: No Bowel Continence: No Work: No Toileting: No Drive: No Dressing: No Hobbies: No Engineer, maintenance) Signed: 09/03/2020 5:48:06 PM By: Levan Hurst RN, BSN Entered By: Levan Hurst on 09/03/2020 08:43:43 -------------------------------------------------------------------------------- Patient/Caregiver Education Details Patient Name: Date of Service: [name] 1/27/2022andnbsp8:45 [place] Record Number: [id number] Patient Account Number: [id number] Date of Birth/Gender: Treating RN: [birth year] (45 y.o. [name] Primary Care Physician: [name] Other Clinician: Referring Physician: Treating Physician/Extender: [name] in Treatment: 7 Education  Assessment Education Provided To: Patient Education Topics Provided Wound/Skin Impairment: Handouts: Skin Care Do's and Dont's Methods: Explain/Verbal Responses: Reinforcements needed Electronic Signature(s) Signed: 09/03/2020 5:42:01 PM By: [name] Entered By: [name] on 09/03/2020 09:13:03 -------------------------------------------------------------------------------- Wound Assessment Details Patient Name: Date of Service: [name], [place] 09/03/2020 8:45 A M Medical Record Number: [id number] Patient Account Number: [id number] Date of Birth/Sex: Treating RN: 1975-09-03 (46 y.o. [name] Primary Care Germani Gavilanes: [name] Other Clinician: Referring Jermon Chalfant: Treating Courney Garrod/Extender: [name] in Treatment: 7 Wound Status Wound Number: 1 Primary Etiology: Pressure Ulcer Wound Location: Right Calcaneus Wound Status: Open Wounding Event: Pressure Injury Comorbid History: Asthma, Angina, Hypertension Date Acquired: 10/07/2019 Weeks Of Treatment: 7 Clustered Wound: No Wound Measurements Length: (cm) 6.4 Width: (cm) 2.4 Depth: (cm) 0.7 Area: (cm) 12.064 Volume: (cm) 8.445 % Reduction in Area: 61.8% % Reduction in Volume: 73.3% Epithelialization: Small (1-33%) Tunneling: No Undermining: No Wound Description Classification: Category/Stage III Wound Margin: Thickened Exudate Amount: Medium Exudate Type: Serosanguineous Exudate Color: red, brown Foul Odor After Cleansing: No Slough/Fibrino Yes Wound Bed Granulation Amount: Medium (34-66%) Exposed Structure Granulation Quality: Pink Fascia Exposed: No Necrotic Amount: Medium (34-66%) Fat Layer (Subcutaneous Tissue) Exposed: Yes Necrotic Quality: Adherent Slough Tendon Exposed: No Muscle Exposed: No Joint Exposed: No Bone Exposed: No Treatment Notes Wound #1 (Calcaneus) Wound Laterality: Right Cleanser Soap and Water Discharge Instruction: May shower and wash  wound with dial antibacterial soap and water prior to dressing change. Peri-Wound Care Topical Primary Dressing Promogran Prisma Matrix, 4.34 (sq in) (silver collagen) Discharge Instruction: Moisten collagen with saline or hydrogel Secondary Dressing Woven  Gauze Sponge, Non-Sterile 4x4 in Discharge Instruction: ensure to fold and apply up to the Collagen to ensure it keeps the Collagen in place next to wound bed. Apply over primary dressing as directed. ABD Pad, 5x9 Discharge Instruction: Apply over primary dressing as directed. Secured With Elastic Bandage 4 inch (ACE bandage) Discharge Instruction: Secure with ACE bandage as directed. Kerlix Roll Sterile, 4.5x3.1 (in/yd) Discharge Instruction: Secure with Kerlix as directed. Paper Tape, 2x10 (in/yd) Discharge Instruction: Secure dressing with tape as directed. Compression Wrap Compression Stockings Add-Ons Electronic Signature(s) Signed: 09/03/2020 5:48:06 PM By: Levan Hurst RN, BSN Entered By: Levan Hurst on 09/03/2020 08:58:32 -------------------------------------------------------------------------------- Wound Assessment Details Patient Name: Date of Service: [name], [place] 09/03/2020 8:45 A M Medical Record Number: [id number] Patient Account Number: [id number] Date of Birth/Sex: Treating RN: 28-Feb-1976 (45 y.o. [name] Primary Care Montrell Cessna: [name] Other Clinician: Referring Aayat Hajjar: Treating Aidynn Krenn/Extender: [name] in Treatment: 7 Wound Status Wound Number: 2 Primary Etiology: Pressure Ulcer Wound Location: Left Calcaneus Wound Status: Open Wounding Event: Pressure Injury Comorbid History: Asthma, Angina, Hypertension Date Acquired: 10/07/2019 Weeks Of Treatment: 7 Clustered Wound: No Wound Measurements Length: (cm) 7.7 Width: (cm) 2.5 Depth: (cm) 0.7 Area: (cm) 15.119 Volume: (cm) 10.583 % Reduction in Area: 44.2% % Reduction in Volume:  60.9% Epithelialization: Small (1-33%) Tunneling: No Undermining: No Wound Description Classification: Category/Stage III Wound Margin: Indistinct, nonvisible Exudate Amount: Medium Exudate Type: Serosanguineous Exudate Color: red, brown Foul Odor After Cleansing: No Slough/Fibrino Yes Wound Bed Granulation Amount: Medium (34-66%) Exposed Structure Granulation Quality: Pink Fascia Exposed: No Necrotic Amount: Medium (34-66%) Fat Layer (Subcutaneous Tissue) Exposed: Yes Necrotic Quality: Adherent Slough Tendon Exposed: No Muscle Exposed: No Joint Exposed: No Bone Exposed: No Treatment Notes Wound #2 (Calcaneus) Wound Laterality: Left Cleanser Soap and Water Discharge Instruction: May shower and wash wound with dial antibacterial soap and water prior to dressing change. Peri-Wound Care Topical Primary Dressing Promogran Prisma Matrix, 4.34 (sq in) (silver collagen) Discharge Instruction: Moisten collagen with saline or hydrogel Secondary Dressing Woven Gauze Sponge, Non-Sterile 4x4 in Discharge Instruction: ensure to fold and apply up to the Collagen to ensure it keeps the Collagen in place next to wound bed. Apply over primary dressing as directed. ABD Pad, 5x9 Discharge Instruction: Apply over primary dressing as directed. Secured With Elastic Bandage 4 inch (ACE bandage) Discharge Instruction: Secure with ACE bandage as directed. Kerlix Roll Sterile, 4.5x3.1 (in/yd) Discharge Instruction: Secure with Kerlix as directed. Paper Tape, 2x10 (in/yd) Discharge Instruction: Secure dressing with tape as directed. Compression Wrap Compression Stockings Add-Ons Electronic Signature(s) Signed: 09/03/2020 5:48:06 PM By: Levan Hurst RN, BSN Entered By: Levan Hurst on 09/03/2020 08:58:52 -------------------------------------------------------------------------------- [place] Details Patient Name: Date of Service: [name], [place] 09/03/2020 8:45 A M Medical Record  Number: [id number] Patient Account Number: [id number] Date of Birth/Sex: Treating RN: May 03, 1976 (45 y.o. [name] Primary Care Makinze Jani: [name] Other Clinician: Referring Iyah Laguna: Treating Freddi Schrager/Extender: [name] in Treatment: 7 Vital Signs Time Taken: 08:43 Temperature (F): 98.1 Height (in): 69 Pulse (bpm): 80 Weight (lbs): 280 Respiratory Rate (breaths/min): 18 Body Mass Index (BMI): 41.3 Blood Pressure (mmHg): 131/71 Reference Range: 80 - 120 mg / dl Electronic Signature(s) Signed: 09/03/2020 5:48:06 PM By: Levan Hurst RN, BSN Entered By: Levan Hurst on 09/03/2020 08:43:24

## 2020-09-03 NOTE — Progress Notes (Signed)
[name] ([id number]) Visit Report for 09/03/2020 HPI Details Patient Name: Date of Service: [name], [place] 09/03/2020 8:45 A M Medical Record Number: [id number] Patient Account Number: [id number] Date of Birth/Sex: Treating RN: 1975-09-06 (45 y.o. [name] Primary Care Provider: [name] Other Clinician: Referring Provider: Treating Provider/Extender: [name] in Treatment: 7 History of Present Illness HPI Description: 07/15/2020 upon evaluation today patient presents for initial inspection here in our clinic concerning issues he has been having with the bottoms of his feet bilaterally. He states these actually occurred as wounds when he was hospitalized for 5 months secondary to Covid. He was apparently with tilting bed where he was in an upright position quite frequently and apparently this occurred in some way shape or form during that time. Fortunately there is no sign of active infection at this time. No fevers, chills, nausea, vomiting, or diarrhea. With that being said he still has substantial wounds on the plantar aspects of his feet Theragen require quite a bit of work to get these to heal. He has been using Santyl currently though that is been problematic both in receiving the medication as well as actually paid for it as it is become quite expensive. Prior to the experience with Covid the patient really did not have any major medical problems other than hypertension he does have some mild generalized weakness following the Covid experience. 07/22/2020 on evaluation today patient appears to be doing okay in regard to his foot ulcers I feel like the wound beds are showing signs of better improvement that I do believe the Iodoflex is helping in this regard. With that being said he does have a lot of drainage currently and this is somewhat blue/green in nature which is consistent with Pseudomonas. I do think a culture today would be appropriate  for [place] to evaluate and see if that is indeed the case I would likely start him on antibiotic orally as well he is not allergic to Cipro knows of no issues he has had in the past 12/21; patient was admitted to the clinic earlier this month with bilateral presumed pressure ulcers on the bottom of his feet apparently related to excessive pressure from a tilt table arrangement in the intensive care unit. Patient relates this to being on ECMO but I am not really sure that is exactly related to that. I must say I have never seen anything like this. He has fairly extensive full-thickness wounds extending from his heel towards his midfoot mostly centered laterally. There is already been some healing distally. He does not appear to have an arterial issue. He has been using gentamicin to the wound surfaces with Iodoflex to help with ongoing debridement 1/6; this is a patient with pressure ulcers on the bottom of his feet related to excessive pressure from a standing position in the intensive care unit. He is complaining of a lot of pain in the right heel. He is not a diabetic. 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 We have been using Iodoflex to help prepare the surfaces of both wounds for an advanced treatment product. He is nonambulatory spending most of his time in a wheelchair I have asked him not to propel the wheelchair with his heels 1/13; in general his wounds look better not much surface area change we have been using Iodoflex as of last week. I did an x-ray of the right heel as the patient was complaining of pain. I  had some thoughts about a stress fracture perhaps Achilles tendon problems however what it showed was erosive changes along the inferior aspect of the calcaneus he now has a MRI booked for 1/20. 1/20; in general his wounds continue to be better. Some improvement in the large narrow areas proximally in his foot. He is still complaining of pain in the right heel  and tenderness in certain areas of this wound. His MRI is tonight. I am not just looking for osteomyelitis that was brought up on the x-ray I am wondering about stress fractures, tendon ruptures etc. He has no such findings on the left. Also noteworthy is that the patient had critical illness neuropathy and some of the discomfort may be actual improvement in nerve function I am just not sure. These wounds were initially in the setting of severe critical illness related to COVID-19. He was put in a standing position. He may have also been on pressors at the point contributing to tissue ischemia. By his description at some point these wounds were grossly necrotic extending proximally up into the Achilles part of his heel. I do not know that I have ever really seen pictures of them like this although they may exist in epic We have ordered Tri layer Oasis. I am trying to stimulate some granulation in these areas. This is of course assuming the MRI is negative for infection 1/27; since the patient was last here he saw Dr. Juleen China of infectious disease. He is planned for vancomycin and ceftriaxone. Prior operative culture grew MSSA. Also ordered baseline lab work. He also ordered arterial studies although the ABIs in our clinic were normal as well as his clinical exam these were normal I do not think he needs to see vascular surgery. His ABIs at the PTA were 1.22 in the right triphasic waveforms with a normal TBI of 1.15 on the left ABI of 1.22 with triphasic waveforms and a normal TBI of 1.08. Finally he saw Dr. Amalia Hailey who will follow him in for 2 months. At this point I do not think he felt that he needed a procedure on the right calcaneal bone. Dr. Juleen China is elected for broad-spectrum antibiotic The patient is still having pain in the right heel. He walks with a walker Electronic Signature(s) Signed: 09/03/2020 4:56:03 PM By: Linton Ham MD Entered By: Linton Ham on 09/03/2020  10:07:11 -------------------------------------------------------------------------------- Physical Exam Details Patient Name: Date of Service: [name], [place] 09/03/2020 8:45 A M Medical Record Number: [id number] Patient Account Number: [id number] Date of Birth/Sex: Treating RN: [birth year] (45 y.o. [name] Primary Care Provider: [name] Other Clinician: Referring Provider: Treating Provider/Extender: [name] in Treatment: 7 Constitutional Sitting or standing Blood Pressure is within target range for patient.. Pulse regular and within target range for patient.Marland Kitchen Respirations regular, non-labored and within target range.. Temperature is normal and within the target range for the patient.Marland Kitchen Appears in no distress. Cardiovascular Pedal pulses are robust in both feet dorsalis pedis and posterior tibial. Notes Wound exam; He continues to have pain on the plantar aspect of the right heel involving the wound and the surrounding tissue. No erythema no purulence. There is no exposed bone. The wound bed actually looks quite healthy and the wound is contracted especially the narrow area that was spreading into the medial foot Similarly on the left foot the wound actually is contracted still a substantial wound. The linear part going towards the medial foot has closed down considerably. There is  no exposed bone Electronic Signature(s) Signed: 09/03/2020 4:56:03 PM By: Linton Ham MD Entered By: Linton Ham on 09/03/2020 10:08:31 -------------------------------------------------------------------------------- Physician Orders Details Patient Name: Date of Service: [name], [place] 09/03/2020 8:45 A M Medical Record Number: [id number] Patient Account Number: [id number] Date of Birth/Sex: Treating RN: 07-Oct-1975 (45 y.o. [name] Primary Care Provider: [name] Other Clinician: Referring Provider: Treating Provider/Extender: [name] in Treatment: 7 Verbal / Phone Orders: No Diagnosis Coding ICD-10 Coding Code Description [phone] Pressure ulcer of other site, stage 3 M62.81 Muscle weakness (generalized) I10 Essential (primary) hypertension Follow-up Appointments Return Appointment in 1 week. Bathing/ Shower/ Hygiene May shower and wash wound with soap and water. - wash wounds with dial antibacterial soap Edema Control - Lymphedema / SCD / Other Bilateral Lower Extremities Elevate legs to the level of the heart or above for 30 minutes daily and/or when sitting, a frequency of: Avoid standing for long periods of time. Off-Loading Other: - keep pressure off of the bottom of your feet Wound Treatment Wound #1 - Calcaneus Wound Laterality: Right Cleanser: Soap and Water 3 x Per Week/30 Days Discharge Instructions: May shower and wash wound with dial antibacterial soap and water prior to dressing change. Prim Dressing: Promogran Prisma Matrix, 4.34 (sq in) (silver collagen) (DME) (Generic) 3 x Per Week/30 Days ary Discharge Instructions: Moisten collagen with saline or hydrogel Secondary Dressing: Woven Gauze Sponge, Non-Sterile 4x4 in (Generic) 3 x Per Week/30 Days Discharge Instructions: ensure to fold and apply up to the Collagen to ensure it keeps the Collagen in place next to wound bed. Apply over primary dressing as directed. Secondary Dressing: ABD Pad, 5x9 3 x Per Week/30 Days Discharge Instructions: Apply over primary dressing as directed. Secured With: Elastic Bandage 4 inch (ACE bandage) 3 x Per Week/30 Days Discharge Instructions: Secure with ACE bandage as directed. Secured With: [employer], 4.5x3.1 (in/yd) (Generic) 3 x Per Week/30 Days Discharge Instructions: Secure with Kerlix as directed. Secured With: Paper Tape, 2x10 (in/yd) (Generic) 3 x Per Week/30 Days Discharge Instructions: Secure dressing with tape as directed. Wound #2 - Calcaneus Wound Laterality:  Left Cleanser: Soap and Water 3 x Per Week/30 Days Discharge Instructions: May shower and wash wound with dial antibacterial soap and water prior to dressing change. Prim Dressing: Promogran Prisma Matrix, 4.34 (sq in) (silver collagen) (DME) (Generic) 3 x Per Week/30 Days ary Discharge Instructions: Moisten collagen with saline or hydrogel Secondary Dressing: Woven Gauze Sponge, Non-Sterile 4x4 in (Generic) 3 x Per Week/30 Days Discharge Instructions: ensure to fold and apply up to the Collagen to ensure it keeps the Collagen in place next to wound bed. Apply over primary dressing as directed. Secondary Dressing: ABD Pad, 5x9 3 x Per Week/30 Days Discharge Instructions: Apply over primary dressing as directed. Secured With: Elastic Bandage 4 inch (ACE bandage) 3 x Per Week/30 Days Discharge Instructions: Secure with ACE bandage as directed. Secured With: [employer], 4.5x3.1 (in/yd) (Generic) 3 x Per Week/30 Days Discharge Instructions: Secure with Kerlix as directed. Secured With: Paper Tape, 2x10 (in/yd) (Generic) 3 x Per Week/30 Days Discharge Instructions: Secure dressing with tape as directed. Electronic Signature(s) Signed: 09/03/2020 4:56:03 PM By: Linton Ham MD Signed: 09/03/2020 5:42:01 PM By: [name] Entered By: [name] on 09/03/2020 09:33:32 -------------------------------------------------------------------------------- Problem List Details Patient Name: Date of Service: [name], [place] 09/03/2020 8:45 A M Medical Record Number: [id number] Patient Account Number: [id number] Date of Birth/Sex: Treating RN:  [birth year] (45 y.o. [name], [name] Primary Care Provider: [name] Other Clinician: Referring Provider: Treating Provider/Extender: [name] in Treatment: 7 Active Problems ICD-10 Encounter Code Description Active Date MDM Diagnosis [phone] Pressure ulcer of other site, stage 3 07/15/2020 No Yes M62.81 Muscle  weakness (generalized) 07/15/2020 No Yes I10 Essential (primary) hypertension 07/15/2020 No Yes L97.512 Non-pressure chronic ulcer of other part of right foot with fat layer exposed 09/03/2020 No Yes L97.522 Non-pressure chronic ulcer of other 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 09/03/2020 No Yes M86.171 Other acute osteomyelitis, right ankle and foot 09/03/2020 No Yes Inactive Problems Resolved Problems Electronic Signature(s) Signed: 09/03/2020 4:56:03 PM By: Linton Ham MD Entered By: Linton Ham on 09/03/2020 10:04:42 -------------------------------------------------------------------------------- Progress Note Details Patient Name: Date of Service: [name], [place] 09/03/2020 8:45 A M Medical Record Number: [id number] Patient Account Number: [id number] Date of Birth/Sex: Treating RN: [birth year] (45 y.o. [name] Primary Care Provider: [name] Other Clinician: Referring Provider: Treating Provider/Extender: [name] in Treatment: 7 Subjective History of Present Illness (HPI) 07/15/2020 upon evaluation today patient presents for initial inspection here in our clinic concerning issues he has been having with the bottoms of his feet bilaterally. He states these actually occurred as wounds when he was hospitalized for 5 months secondary to Covid. He was apparently with tilting bed where he was in an upright position quite frequently and apparently this occurred in some way shape or form during that time. Fortunately there is no sign of active infection at this time. No fevers, chills, nausea, vomiting, or diarrhea. With that being said he still has substantial wounds on the plantar aspects of his feet Theragen require quite a bit of work to get these to heal. He has been using Santyl currently though that is been problematic both in receiving the medication as well as actually paid for it as it is become quite expensive. Prior to the experience  with Covid the patient really did not have any major medical problems other than hypertension he does have some mild generalized weakness following the Covid experience. 07/22/2020 on evaluation today patient appears to be doing okay in regard to his foot ulcers I feel like the wound beds are showing signs of better improvement that I do believe the Iodoflex is helping in this regard. With that being said he does have a lot of drainage currently and this is somewhat blue/green in nature which is consistent with Pseudomonas. I do think a culture today would be appropriate for [place] to evaluate and see if that is indeed the case I would likely start him on antibiotic orally as well he is not allergic to Cipro knows of no issues he has had in the past 12/21; patient was admitted to the clinic earlier this month with bilateral presumed pressure ulcers on the bottom of his feet apparently related to excessive pressure from a tilt table arrangement in the intensive care unit. Patient relates this to being on ECMO but I am not really sure that is exactly related to that. I must say I have never seen anything like this. He has fairly extensive full-thickness wounds extending from his heel towards his midfoot mostly centered laterally. There is already been some healing distally. He does not appear to have an arterial issue. He has been using gentamicin to the wound surfaces with Iodoflex to help with ongoing debridement 1/6; this is a patient with pressure ulcers on the bottom  of his feet related to excessive pressure from a standing position in the intensive care unit. He is complaining of a lot of pain in the right heel. He is not a diabetic. 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 We have been using Iodoflex to help prepare the surfaces of both wounds for an advanced treatment product. He is nonambulatory spending most of his time in a wheelchair I have asked him not to propel the  wheelchair with his heels 1/13; in general his wounds look better not much surface area change we have been using Iodoflex as of last week. I did an x-ray of the right heel as the patient was complaining of pain. I had some thoughts about a stress fracture perhaps Achilles tendon problems however what it showed was erosive changes along the inferior aspect of the calcaneus he now has a MRI booked for 1/20. 1/20; in general his wounds continue to be better. Some improvement in the large narrow areas proximally in his foot. He is still complaining of pain in the right heel and tenderness in certain areas of this wound. His MRI is tonight. I am not just looking for osteomyelitis that was brought up on the x-ray I am wondering about stress fractures, tendon ruptures etc. He has no such findings on the left. Also noteworthy is that the patient had critical illness neuropathy and some of the discomfort may be actual improvement in nerve function I am just not sure. These wounds were initially in the setting of severe critical illness related to COVID-19. He was put in a standing position. He may have also been on pressors at the point contributing to tissue ischemia. By his description at some point these wounds were grossly necrotic extending proximally up into the Achilles part of his heel. I do not know that I have ever really seen pictures of them like this although they may exist in epic We have ordered Tri layer Oasis. I am trying to stimulate some granulation in these areas. This is of course assuming the MRI is negative for infection 1/27; since the patient was last here he saw Dr. Juleen China of infectious disease. He is planned for vancomycin and ceftriaxone. Prior operative culture grew MSSA. Also ordered baseline lab work. He also ordered arterial studies although the ABIs in our clinic were normal as well as his clinical exam these were normal I do not think he needs to see vascular surgery. His  ABIs at the PTA were 1.22 in the right triphasic waveforms with a normal TBI of 1.15 on the left ABI of 1.22 with triphasic waveforms and a normal TBI of 1.08. Finally he saw Dr. Amalia Hailey who will follow him in for 2 months. At this point I do not think he felt that he needed a procedure on the right calcaneal bone. Dr. Juleen China is elected for broad-spectrum antibiotic The patient is still having pain in the right heel. He walks with a walker Objective Constitutional Sitting or standing Blood Pressure is within target range for patient.. Pulse regular and within target range for patient.Marland Kitchen Respirations regular, non-labored and within target range.. Temperature is normal and within the target range for the patient.Marland Kitchen Appears in no distress. Vitals Time Taken: 8:43 AM, Height: 69 in, Weight: 280 lbs, BMI: 41.3, Temperature: 98.1 F, Pulse: 80 bpm, Respiratory Rate: 18 breaths/min, Blood Pressure: 131/71 mmHg. Cardiovascular Pedal pulses are robust in both feet dorsalis pedis and posterior tibial. General Notes: Wound exam; ooHe continues to  have pain on the plantar aspect of the right heel involving the wound and the surrounding tissue. No erythema no purulence. There is no exposed bone. The wound bed actually looks quite healthy and the wound is contracted especially the narrow area that was spreading into the medial foot ooSimilarly on the left foot the wound actually is contracted still a substantial wound. The linear part going towards the medial foot has closed down considerably. There is no exposed bone Integumentary (Hair, Skin) Wound #1 status is Open. Original cause of wound was Pressure Injury. The wound is located on the Right Calcaneus. The wound measures 6.4cm length x 2.4cm width x 0.7cm depth; 12.064cm^2 area and 8.445cm^3 volume. There is Fat Layer (Subcutaneous Tissue) exposed. There is no tunneling or undermining noted. There is a medium amount of serosanguineous drainage noted. The  wound margin is thickened. There is medium (34-66%) pink granulation within the wound bed. There is a medium (34-66%) amount of necrotic tissue within the wound bed including Adherent Slough. Wound #2 status is Open. Original cause of wound was Pressure Injury. The wound is located on the Left Calcaneus. The wound measures 7.7cm length x 2.5cm width x 0.7cm depth; 15.119cm^2 area and 10.583cm^3 volume. There is Fat Layer (Subcutaneous Tissue) exposed. There is no tunneling or undermining noted. There is a medium amount of serosanguineous drainage noted. The wound margin is indistinct and nonvisible. There is medium (34-66%) pink granulation within the wound bed. There is a medium (34-66%) amount of necrotic tissue within the wound bed including Adherent Slough. Assessment Active Problems ICD-10 Pressure ulcer of other site, stage 3 Muscle weakness (generalized) Essential (primary) hypertension Non-pressure chronic ulcer of other part of right foot with fat layer exposed Non-pressure chronic ulcer of other 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 Other acute osteomyelitis, right ankle and foot Plan Follow-up Appointments: Return Appointment in 1 week. Bathing/ Shower/ Hygiene: May shower and wash wound with soap and water. - wash wounds with dial antibacterial soap Edema Control - Lymphedema / SCD / Other: Elevate legs to the level of the heart or above for 30 minutes daily and/or when sitting, a frequency of: Avoid standing for long periods of time. Off-Loading: Other: - keep pressure off of the bottom of your feet WOUND #1: - Calcaneus Wound Laterality: Right Cleanser: Soap and Water 3 x Per Week/30 Days Discharge Instructions: May shower and wash wound with dial antibacterial soap and water prior to dressing change. Prim Dressing: Promogran Prisma Matrix, 4.34 (sq in) (silver collagen) (DME) (Generic) 3 x Per Week/30 Days ary Discharge Instructions: Moisten collagen with saline or  hydrogel Secondary Dressing: Woven Gauze Sponge, Non-Sterile 4x4 in (Generic) 3 x Per Week/30 Days Discharge Instructions: ensure to fold and apply up to the Collagen to ensure it keeps the Collagen in place next to wound bed. Apply over primary dressing as directed. Secondary Dressing: ABD Pad, 5x9 3 x Per Week/30 Days Discharge Instructions: Apply over primary dressing as directed. Secured With: Elastic Bandage 4 inch (ACE bandage) 3 x Per Week/30 Days Discharge Instructions: Secure with ACE bandage as directed. Secured With: [employer], 4.5x3.1 (in/yd) (Generic) 3 x Per Week/30 Days Discharge Instructions: Secure with Kerlix as directed. Secured With: Paper T ape, 2x10 (in/yd) (Generic) 3 x Per Week/30 Days Discharge Instructions: Secure dressing with tape as directed. WOUND #2: - Calcaneus Wound Laterality: Left Cleanser: Soap and Water 3 x Per Week/30 Days Discharge Instructions: May shower and wash wound with dial antibacterial soap  and water prior to dressing change. Prim Dressing: Promogran Prisma Matrix, 4.34 (sq in) (silver collagen) (DME) (Generic) 3 x Per Week/30 Days ary Discharge Instructions: Moisten collagen with saline or hydrogel Secondary Dressing: Woven Gauze Sponge, Non-Sterile 4x4 in (Generic) 3 x Per Week/30 Days Discharge Instructions: ensure to fold and apply up to the Collagen to ensure it keeps the Collagen in place next to wound bed. Apply over primary dressing as directed. Secondary Dressing: ABD Pad, 5x9 3 x Per Week/30 Days Discharge Instructions: Apply over primary dressing as directed. Secured With: Elastic Bandage 4 inch (ACE bandage) 3 x Per Week/30 Days Discharge Instructions: Secure with ACE bandage as directed. Secured With: [employer], 4.5x3.1 (in/yd) (Generic) 3 x Per Week/30 Days Discharge Instructions: Secure with Kerlix as directed. Secured With: Paper T ape, 2x10 (in/yd) (Generic) 3 x Per Week/30 Days Discharge Instructions:  Secure dressing with tape as directed. 1. I change his primary dressing to moistened silver collagen. I think we have gotten as much benefit from Iodoflex as we are going to get in terms of surface debridement I did not feel that any debridement was indicated today. 2. I appreciate Dr. Alcario Drought review and broad-spectrum antibiotic coverage 3. The patient does not yet fit the definition of chronic refractory osteomyelitis but will keep an eye on both the wound and the underlying inflammatory markers as it applies to making this diagnosis and perhaps qualifying for hyperbaric oxygen 4. I think a total contact cast on the left foot is probably indicated if his balance can stand it. I will look forward to this next week. 5. We did ask for Oasis and I think he has a co-pay however he is probably eating through his out-of-pocket max even as we speak. This also is something that I will keep in my back pocket for now 6. His vascular studies have always been normal as his clinical exam. He had formal studies ordered by Dr. Juleen China these were normal. Electronic Signature(s) Signed: 09/03/2020 4:56:03 PM By: Linton Ham MD Entered By: Linton Ham on 09/03/2020 10:10:30 -------------------------------------------------------------------------------- SuperBill Details Patient Name: Date of Service: [name], [place]. 09/03/2020 Medical Record Number: [id number] Patient Account Number: [id number] Date of Birth/Sex: Treating RN: Mar 17, 1976 (45 y.o. [name] Primary Care Provider: [name] Other Clinician: Referring Provider: Treating Provider/Extender: [name] in Treatment: 7 Diagnosis Coding ICD-10 Codes Code Description [id number] Pressure ulcer of other site, stage 3 M62.81 Muscle weakness (generalized) I10 Essential (primary) hypertension Facility Procedures Electronic Signature(s) Signed: 09/03/2020 4:56:03 PM By: Linton Ham MD Signed: 09/03/2020  5:42:01 PM By: [name] Entered By: [name] on 09/03/2020 09:23:35

## 2020-09-04 ENCOUNTER — Ambulatory Visit (HOSPITAL_COMMUNITY)
Admission: RE | Admit: 2020-09-04 | Discharge: 2020-09-04 | Disposition: A | Discharge status: Home / Self Care | Payer: BC Managed Care – PPO | Source: Ambulatory Visit | Attending: Internal Medicine | Admitting: Internal Medicine

## 2020-09-04 ENCOUNTER — Encounter (HOSPITAL_COMMUNITY): Payer: BC Managed Care – PPO

## 2020-09-04 ENCOUNTER — Encounter (HOSPITAL_COMMUNITY)
Admission: RE | Admit: 2020-09-04 | Discharge: 2020-09-04 | Disposition: A | Discharge status: Home / Self Care | Payer: BC Managed Care – PPO | Source: Ambulatory Visit | Attending: Internal Medicine | Admitting: Internal Medicine

## 2020-09-04 ENCOUNTER — Other Ambulatory Visit: Payer: Self-pay

## 2020-09-04 DIAGNOSIS — M8610 Other acute osteomyelitis, unspecified site: Secondary | ICD-10-CM | POA: Diagnosis not present

## 2020-09-04 DIAGNOSIS — A4901 Methicillin susceptible Staphylococcus aureus infection, unspecified site: Secondary | ICD-10-CM | POA: Insufficient documentation

## 2020-09-04 DIAGNOSIS — Z8616 Personal history of COVID-19: Secondary | ICD-10-CM | POA: Diagnosis not present

## 2020-09-04 DIAGNOSIS — S91309A Unspecified open wound, unspecified foot, initial encounter: Secondary | ICD-10-CM | POA: Diagnosis not present

## 2020-09-04 MED ORDER — HEPARIN SOD (PORK) LOCK FLUSH 100 UNIT/ML IV SOLN
250.0000 [IU] | Freq: Every day | INTRAVENOUS | Status: DC
  Administered 2020-09-04: 250 [IU]

## 2020-09-04 MED ORDER — LIDOCAINE HCL 1 % IJ SOLN
INTRAMUSCULAR | Status: DC | PRN
  Administered 2020-09-04: 5 mL

## 2020-09-04 MED ORDER — LIDOCAINE HCL 1 % IJ SOLN
INTRAMUSCULAR | Status: AC
  Filled 2020-09-04: qty 20

## 2020-09-04 MED ORDER — SODIUM CHLORIDE 0.9 % IV SOLN
2.0000 g | Freq: Once | INTRAVENOUS | Status: AC
  Administered 2020-09-04: 2 g via INTRAVENOUS
  Filled 2020-09-04: qty 2

## 2020-09-04 MED ORDER — HEPARIN SOD (PORK) LOCK FLUSH 100 UNIT/ML IV SOLN
INTRAVENOUS | Status: AC
  Filled 2020-09-04: qty 5

## 2020-09-04 MED ORDER — HEPARIN SOD (PORK) LOCK FLUSH 100 UNIT/ML IV SOLN: 250.0000 [IU] | INTRAVENOUS | Status: DC | PRN

## 2020-09-04 MED ORDER — SODIUM CHLORIDE 0.9 % IV SOLN
1000.0000 mg | Freq: Once | INTRAVENOUS | Status: AC
  Administered 2020-09-04: 1000 mg via INTRAVENOUS
  Filled 2020-09-04: qty 20

## 2020-09-04 NOTE — Procedures (Signed)
Successful placement of single lumen PICC line to basilc vein. Length 39 cm Tip at lower SVC/RA PICC capped No complications Ready for use.  EBL < 5 mL   Ascencion Dike PA-C 09/04/2020 11:20 AM

## 2020-09-05 DIAGNOSIS — L03115 Cellulitis of right lower limb: Secondary | ICD-10-CM | POA: Diagnosis not present

## 2020-09-05 DIAGNOSIS — M86171 Other acute osteomyelitis, right ankle and foot: Secondary | ICD-10-CM | POA: Diagnosis not present

## 2020-09-07 ENCOUNTER — Telehealth: Payer: Self-pay | Admitting: Podiatry

## 2020-09-07 NOTE — Telephone Encounter (Signed)
Patient has requested refill on pain meds, Please Advise

## 2020-09-09 ENCOUNTER — Other Ambulatory Visit: Payer: Self-pay

## 2020-09-09 ENCOUNTER — Encounter: Payer: Self-pay | Admitting: Physical Medicine and Rehabilitation

## 2020-09-09 ENCOUNTER — Encounter
Payer: BC Managed Care – PPO | Attending: Physical Medicine and Rehabilitation | Admitting: Physical Medicine and Rehabilitation

## 2020-09-09 VITALS — BP 133/83 | HR 76 | Temp 98.6°F | Ht 68.0 in | Wt 299.2 lb

## 2020-09-09 DIAGNOSIS — R208 Other disturbances of skin sensation: Secondary | ICD-10-CM

## 2020-09-09 DIAGNOSIS — M86171 Other acute osteomyelitis, right ankle and foot: Secondary | ICD-10-CM | POA: Diagnosis not present

## 2020-09-09 DIAGNOSIS — G7281 Critical illness myopathy: Secondary | ICD-10-CM | POA: Diagnosis not present

## 2020-09-09 DIAGNOSIS — M869 Osteomyelitis, unspecified: Secondary | ICD-10-CM | POA: Insufficient documentation

## 2020-09-09 DIAGNOSIS — L03115 Cellulitis of right lower limb: Secondary | ICD-10-CM | POA: Diagnosis not present

## 2020-09-09 DIAGNOSIS — U099 Post covid-19 condition, unspecified: Secondary | ICD-10-CM | POA: Diagnosis not present

## 2020-09-09 DIAGNOSIS — F329 Major depressive disorder, single episode, unspecified: Secondary | ICD-10-CM

## 2020-09-09 HISTORY — DX: Post covid-19 condition, unspecified: U09.9

## 2020-09-09 HISTORY — DX: Osteomyelitis, unspecified: M86.9

## 2020-09-09 MED ORDER — HYDROCODONE-ACETAMINOPHEN 5-325 MG PO TABS: 1.0000 | ORAL_TABLET | Freq: Two times a day (BID) | ORAL | 0 refills | Status: DC | PRN

## 2020-09-09 NOTE — Progress Notes (Signed)
Subjective:     Patient ID: [name], male   DOB: 1976-02-24, 45 y.o.   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   Has seen a lot of doctors in last 2 months.  Had 1 spot on R foot- really painful when barely touched it- no drainage- looked like healing-  Has infection in bone on R foot. So, has osteomyelitis in R foot!  Saw Dr Juleen China- ID- has been discussed at length- doing  IV ABX- has PICC line- started ABX last Friday- Daptomycin and a cephalosporin of some kind.  Changed wound care as well- turns spongy when gets wet- sounds like honeycomb- pack/tucks into edges/ wound. On IV ABX for 6 weeks.     Had a bad breakdown in ID office- because so scared about it.   Dr Evans/Dr Dellia Nims- caught it pretty early.  No signs of bony erosion or destruction- also has osteomyelitis/myositis- didn't get bone biopsy- since already on ABX.   Belbuca didn't help-  Pain is doing better- not terribly bad- if walks too much, bothers him.  So idle right now- to do ADLs- sitting so much. And eating/depressed.   Sleeping- "OK"- still fragmented- not waking up at 3am daily- hard ot fall asleep.   Getting sleep study- on 09/15/20.  Celexa has helped a little with mood- wife said he's not down in the doldrums chronically-  Was bad last week.  Wife has calmed down some per wife.  (wife's father has COVID pneumonia- "might die").   PCP increased Duloxetine to 120 mg daily per pt.   Hard to cut down on food/intake-  Plays videos games, plays on phone, and watches TV.  It very upset about his weight gain.  Has Norco- not Oxyocdone 5/325 mg     Pain Inventory Average Pain 3 Pain Right Now 3 My pain is intermittent and sharp  In the last 24 hours, has pain interfered with the following? General activity 5 Relation with others 5 Enjoyment of life 5 What TIME of day is your pain at its  worst? morning  Sleep (in general) Fair  Pain is worse with: walking and standing Pain improves with: rest and medication Relief from Meds: 6  Family History  Problem Relation Age of Onset  . Asthma Brother   . Diabetes Father   . Hypertension Father   . Diabetes Paternal Grandmother   . Hypertension Paternal Grandmother   . Migraines Mother   . GER disease Mother   . Pancreatic cancer Maternal Grandmother   . Heart disease Maternal Grandfather    Social History   Socioeconomic History  . Marital status: Married    Spouse name: Not on file  . Number of children: 1  . Years of education: Not on file  . Highest education level: Not on file  Occupational History  . Occupation: delivery driver  Tobacco Use  . Smoking status: Never Smoker  . Smokeless tobacco: Never Used  Vaping Use  . Vaping Use: Never used  Substance and Sexual Activity  . Alcohol use: Yes    Comment: rarely 2-3 times a year  . Drug use: No  . Sexual activity: Yes    Partners: Female  Other Topics Concern  . Not on file  Social History Narrative  . Not on file   Social Determinants of Health  Financial Resource Strain: Not on file  Food Insecurity: Not on file  Transportation Needs: Not on file  Physical Activity: Not on file  Stress: Not on file  Social Connections: Not on file   Past Surgical History:  Procedure Laterality Date  . CANNULATION FOR ECMO (EXTRACORPOREAL MEMBRANE OXYGENATION) N/A 08/28/2019   Procedure: CANNULATION FOR VV ECMO (EXTRACORPOREAL MEMBRANE OXYGENATION);  Surgeon: Prescott Gum, Collier Salina, MD;  Location: [place];  Service: Open Heart Surgery;  Laterality: N/A;  CRESCENT CANNULA  . CANNULATION FOR ECMO (EXTRACORPOREAL MEMBRANE OXYGENATION) N/A 09/10/2019   Procedure: CANNULATION FOR ECMO (EXTRACORPOREAL MEMBRANE OXYGENATION) PUTTING IN CRESCENT 32FR CANNULA  AND REMOVING GROING CANNULATION;  Surgeon: Wonda Olds, MD;  Location: [place];  Service: Open Heart Surgery;   Laterality: N/A;  PUTTING IN CRESCENT/REMOVING GROIN CANNULATION  . CYSTOSCOPY/URETEROSCOPY/HOLMIUM LASER/STENT PLACEMENT Left 04/18/2019   Procedure: LEFT URETEROSCOPY/HOLMIUM LASER/STENT PLACEMENT;  Surgeon: Ardis Hughs, MD;  Location[facility];  Service: Urology;  Laterality: Left;  . CYSTOSCOPY/URETEROSCOPY/HOLMIUM LASER/STENT PLACEMENT Left 05/02/2019   Procedure: CYSTOSCOPY/URETEROSCOPY/HOLMIUM LASER/STENT EXCHANGE;  Surgeon: Ardis Hughs, MD;  Location: [facility];  Service: Urology;  Laterality: Left;  . ECMO CANNULATION N/A 08/03/2019   Procedure: ECMO CANNULATION;  Surgeon: Wonda Olds, MD;  Location: [place] [facility];  Service: Cardiovascular;  Laterality: N/A;  . ESOPHAGOGASTRODUODENOSCOPY N/A 09/11/2019   Procedure: ESOPHAGOGASTRODUODENOSCOPY (EGD);  Surgeon: Wonda Olds, MD;  Location: [facility];  Service: Thoracic;  Laterality: N/A;  . GRAFT APPLICATION Bilateral 0/30/0923   Procedure: APPLICATION OF SKIN GRAFT BILATERAL FEET;  Surgeon: Edrick Kins, DPM;  Location: [facility];  Service: Podiatry;  Laterality: Bilateral;  . IR REPLC GASTRO/COLONIC TUBE PERCUT W/FLUORO  10/14/2019  . IRRIGATION AND DEBRIDEMENT SHOULDER Right 09/29/2017   Procedure: IRRIGATION AND DEBRIDEMENT SHOULDER;  Surgeon: Leandrew Koyanagi, MD;  Location: [place];  Service: Orthopedics;  Laterality: Right;  . [place] SURGERY  2002  . NASAL ENDOSCOPY WITH EPISTAXIS CONTROL N/A 08/31/2019   Procedure: NASAL ENDOSCOPY WITH EPISTAXIS CONTROL WITH CAUTERIZATION;  Surgeon: Melida Quitter, MD;  Location: [place];  Service: ENT;  Laterality: N/A;  . PORTACATH PLACEMENT N/A 09/11/2019   Procedure: PEG TUBE INSERTION - BEDSIDE;  Surgeon: Wonda Olds, MD;  Location: [place];  Service: Thoracic;  Laterality: N/A;  . TEE WITHOUT CARDIOVERSION N/A 08/28/2019   Procedure: TRANSESOPHAGEAL ECHOCARDIOGRAM (TEE);  Surgeon: Prescott Gum, Collier Salina, MD;  Location: [place];  Service: Open Heart Surgery;  Laterality:  N/A;  . TEE WITHOUT CARDIOVERSION N/A 09/10/2019   Procedure: TRANSESOPHAGEAL ECHOCARDIOGRAM (TEE);  Surgeon: Wonda Olds, MD;  Location: [place];  Service: Open Heart Surgery;  Laterality: N/A;  . WOUND DEBRIDEMENT Bilateral 03/27/2020   Procedure: EXCISIONAL DEBRIDEMENT OF ULCERS BILATERAL FEET;  Surgeon: Edrick Kins, DPM;  [facility];  Service: Podiatry;  Laterality: Bilateral;   Past Surgical History:  Procedure Laterality Date  . CANNULATION FOR ECMO (EXTRACORPOREAL MEMBRANE OXYGENATION) N/A 08/28/2019   Procedure: CANNULATION FOR VV ECMO (EXTRACORPOREAL MEMBRANE OXYGENATION);  Surgeon: Prescott Gum, Collier Salina, MD;  Location: [place];  Service: Open Heart Surgery;  Laterality: N/A;  CRESCENT CANNULA  . CANNULATION FOR ECMO (EXTRACORPOREAL MEMBRANE OXYGENATION) N/A 09/10/2019   Procedure: CANNULATION FOR ECMO (EXTRACORPOREAL MEMBRANE OXYGENATION) PUTTING IN CRESCENT 32FR CANNULA  AND REMOVING GROING CANNULATION;  Surgeon: Wonda Olds, MD;  Location: [place];  Service: Open Heart Surgery;  Laterality: N/A;  PUTTING IN CRESCENT/REMOVING GROIN CANNULATION  . CYSTOSCOPY/URETEROSCOPY/HOLMIUM LASER/STENT PLACEMENT  Left 04/18/2019   Procedure: LEFT URETEROSCOPY/HOLMIUM LASER/STENT PLACEMENT;  Surgeon: Ardis Hughs, MD;  Location[facility];  Service: Urology;  Laterality: Left;  . CYSTOSCOPY/URETEROSCOPY/HOLMIUM LASER/STENT PLACEMENT Left 05/02/2019   Procedure: CYSTOSCOPY/URETEROSCOPY/HOLMIUM LASER/STENT EXCHANGE;  Surgeon: Ardis Hughs, MD;  [facility];  Service: Urology;  Laterality: Left;  . ECMO CANNULATION N/A 08/03/2019   Procedure: ECMO CANNULATION;  Surgeon: Wonda Olds, MD;  Location: [place] [facility];  Service: Cardiovascular;  Laterality: N/A;  . ESOPHAGOGASTRODUODENOSCOPY N/A 09/11/2019   Procedure: ESOPHAGOGASTRODUODENOSCOPY (EGD);  Surgeon: Wonda Olds, MD;  [facility];  Service: Thoracic;  Laterality: N/A;  . GRAFT APPLICATION  Bilateral 1/88/4166   Procedure: APPLICATION OF SKIN GRAFT BILATERAL FEET;  Surgeon: Edrick Kins, DPM;  [facility];  Service: Podiatry;  Laterality: Bilateral;  . IR REPLC GASTRO/COLONIC TUBE PERCUT W/FLUORO  10/14/2019  . IRRIGATION AND DEBRIDEMENT SHOULDER Right 09/29/2017   Procedure: IRRIGATION AND DEBRIDEMENT SHOULDER;  Surgeon: Leandrew Koyanagi, MD;  Location: [place];  Service: Orthopedics;  Laterality: Right;  . [place] SURGERY  2002  . NASAL ENDOSCOPY WITH EPISTAXIS CONTROL N/A 08/31/2019   Procedure: NASAL ENDOSCOPY WITH EPISTAXIS CONTROL WITH CAUTERIZATION;  Surgeon: Melida Quitter, MD;  Location: [place];  Service: ENT;  Laterality: N/A;  . PORTACATH PLACEMENT N/A 09/11/2019   Procedure: PEG TUBE INSERTION - BEDSIDE;  Surgeon: Wonda Olds, MD;  Location: [place];  Service: Thoracic;  Laterality: N/A;  . TEE WITHOUT CARDIOVERSION N/A 08/28/2019   Procedure: TRANSESOPHAGEAL ECHOCARDIOGRAM (TEE);  Surgeon: Prescott Gum, Collier Salina, MD;  Location: [place];  Service: Open Heart Surgery;  Laterality: N/A;  . TEE WITHOUT CARDIOVERSION N/A 09/10/2019   Procedure: TRANSESOPHAGEAL ECHOCARDIOGRAM (TEE);  Surgeon: Wonda Olds, MD;  Location: [place];  Service: Open Heart Surgery;  Laterality: N/A;  . WOUND DEBRIDEMENT Bilateral 03/27/2020   Procedure: EXCISIONAL DEBRIDEMENT OF ULCERS BILATERAL FEET;  Surgeon: Edrick Kins, DPM;  [facility];  Service: Podiatry;  Laterality: Bilateral;   Past Medical History:  Diagnosis Date  . Anginal pain ([place])    with covid  . Anxiety   . Asthma   . Dyspnea   . GERD (gastroesophageal reflux disease)   . Headache   . History of kidney stones    LEFT URETERAL STONE  . HTN (hypertension)   . Pancreatitis 2018   GALLBALDDER SLUDGE CAUSED ISSUED RESOLVED  . Pneumonia 07/2019   covid   BP 133/83   Pulse 76   Temp 98.6 F (37 C)   Ht 5' 8"  (1.727 m)   Wt 299 lb 3.2 oz (135.7 kg)   BMI 45.49 kg/m   Opioid Risk Score:   Fall Risk Score:   `1  Depression screen PHQ 2/9  Depression screen [facility] 2/9 07/01/2020 04/15/2020 01/03/2020 12/12/2017 11/01/2017  Decreased Interest 0 - 0 0 0  Down, Depressed, Hopeless 0 - 1 0 0  PHQ - 2 Score 0 - 1 0 0  Altered sleeping - - 1 - -  Tired, decreased energy - - 2 - -  Change in appetite - 1 0 - -  Feeling bad or failure about yourself  - 1 1 - -  Trouble concentrating - - 0 - -  Moving slowly or fidgety/restless - - 1 - -  Suicidal thoughts - - 0 - -  PHQ-9 Score - - 6 - -  Difficult doing work/chores - - Somewhat difficult - -  Some  recent data might be hidden   Review of Systems  Musculoskeletal: Positive for back pain and gait problem.  All other systems reviewed and are negative.      Objective:   Physical Exam Awake, alert, appropriate, accompanied by wife, in manual w/c, NAD MS: HF 4+/5, B/L, KE and KF 4+/5, DF 4-/5 on R and 2/5 on L, PF 4/5 B/L L foot drop more notable    R heel ulcer- C/D/I   Assessment:     Pt is a 45 yr old male with COVID ICU myopathy, Long COVID,  s/p surgery on feet- skin grafts aR foot osteomyelitis as well.     Plan:     1. Obesity- BMI 45 Contract with someone in family to reduce things a little at a time- 1-2 changes per month! Get rid of big cups; don't eat after 8:30 pm; eat protein with sugar/carbs. Decrease plate size. Etc- call me if needs more ideas.   2. Depression-  Call me in 2-4 weeks if mood gets worse- since stopped Celexa 08/13/20. Will change back - can change back to Celexa if need me- Duloxetine can be not as good.   3. Chronic pain- Norco 5/325 mg  2x/day as needed for pain.  Won't sign electronically- will print and see if that works- if doesn't, will try to send in later.   4. Getting sleep study next week for possible OSA.   5. BP better controlled- 133/83- BP   6. Make sure you eat enough to heal this ulcer/osteomyelitis. Beans, nuts, yogurt, meat,    7. [employer]- for protein- FYI- appetite suppressants don't help  emotional eating.   8. F/U in 8 weeks - use recumbent pedals!!  I spent a total of 45 minutes on visit- as detailed above- esp helping with eating/weight/depression.

## 2020-09-09 NOTE — Patient Instructions (Signed)
Pt is a 45 yr old male with COVID ICU myopathy, Long COVID,  s/p surgery on feet- skin grafts aR foot osteomyelitis as well.     Plan:     1. Obesity- BMI 45 Contract with someone in family to reduce things a little at a time- 1-2 changes per month! Get rid of big cups; don't eat after 8:30 pm; eat protein with sugar/carbs. Decrease plate size. Etc- call me if needs more ideas.   2. Depression-  Call me in 2-4 weeks if mood gets worse- since stopped Celexa 08/13/20. Will change back - can change back to Celexa if need me- Duloxetine can be not as good.   3. Chronic pain- Norco 5/325 mg  2x/day as needed for pain.  Won't sign electronically- will print and see if that works- if doesn't, will try to send in later.   4. Getting sleep study next week for possible OSA.   5. BP better controlled- 133/83- BP   6. Make sure you eat enough to heal this ulcer/osteomyelitis. Beans, nuts, yogurt, meat,    7. [employer]- for protein- FYI- appetite suppressants don't help emotional eating.   8. F/U in 8 weeks - use recumbent pedals!!

## 2020-09-10 ENCOUNTER — Encounter (HOSPITAL_BASED_OUTPATIENT_CLINIC_OR_DEPARTMENT_OTHER): Payer: BC Managed Care – PPO | Attending: Internal Medicine | Admitting: Internal Medicine

## 2020-09-10 DIAGNOSIS — M6281 Muscle weakness (generalized): Secondary | ICD-10-CM | POA: Diagnosis not present

## 2020-09-10 DIAGNOSIS — I10 Essential (primary) hypertension: Secondary | ICD-10-CM | POA: Insufficient documentation

## 2020-09-10 DIAGNOSIS — L89893 Pressure ulcer of other site, stage 3: Secondary | ICD-10-CM | POA: Diagnosis not present

## 2020-09-10 DIAGNOSIS — M86171 Other acute osteomyelitis, right ankle and foot: Secondary | ICD-10-CM | POA: Insufficient documentation

## 2020-09-10 DIAGNOSIS — L03115 Cellulitis of right lower limb: Secondary | ICD-10-CM | POA: Diagnosis not present

## 2020-09-10 DIAGNOSIS — Z8616 Personal history of COVID-19: Secondary | ICD-10-CM | POA: Insufficient documentation

## 2020-09-10 NOTE — Progress Notes (Signed)
[name], [name] ([id number]) Visit Report for 09/10/2020 HPI Details Patient Name: Date of Service: [name], [place] 09/10/2020 8:45 A M Medical Record Number: [id number] Patient Account Number: [id number] Date of Birth/Sex: Treating RN: November 05, 1975 (45 y.o. [name] Primary Care Provider: [name] Other Clinician: Referring Provider: Treating Provider/Extender: [name] in Treatment: 8 History of Present Illness HPI Description: 07/15/2020 upon evaluation today patient presents for initial inspection here in our clinic concerning issues he has been having with the bottoms of his feet bilaterally. He states these actually occurred as wounds when he was hospitalized for 5 months secondary to Covid. He was apparently with tilting bed where he was in an upright position quite frequently and apparently this occurred in some way shape or form during that time. Fortunately there is no sign of active infection at this time. No fevers, chills, nausea, vomiting, or diarrhea. With that being said he still has substantial wounds on the plantar aspects of his feet Theragen require quite a bit of work to get these to heal. He has been using Santyl currently though that is been problematic both in receiving the medication as well as actually paid for it as it is become quite expensive. Prior to the experience with Covid the patient really did not have any major medical problems other than hypertension he does have some mild generalized weakness following the Covid experience. 07/22/2020 on evaluation today patient appears to be doing okay in regard to his foot ulcers I feel like the wound beds are showing signs of better improvement that I do believe the Iodoflex is helping in this regard. With that being said he does have a lot of drainage currently and this is somewhat blue/green in nature which is consistent with Pseudomonas. I do think a culture today would be appropriate  for [place] to evaluate and see if that is indeed the case I would likely start him on antibiotic orally as well he is not allergic to Cipro knows of no issues he has had in the past 12/21; patient was admitted to the clinic earlier this month with bilateral presumed pressure ulcers on the bottom of his feet apparently related to excessive pressure from a tilt table arrangement in the intensive care unit. Patient relates this to being on ECMO but I am not really sure that is exactly related to that. I must say I have never seen anything like this. He has fairly extensive full-thickness wounds extending from his heel towards his midfoot mostly centered laterally. There is already been some healing distally. He does not appear to have an arterial issue. He has been using gentamicin to the wound surfaces with Iodoflex to help with ongoing debridement 1/6; this is a patient with pressure ulcers on the bottom of his feet related to excessive pressure from a standing position in the intensive care unit. He is complaining of a lot of pain in the right heel. He is not a diabetic. 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 We have been using Iodoflex to help prepare the surfaces of both wounds for an advanced treatment product. He is nonambulatory spending most of his time in a wheelchair I have asked him not to propel the wheelchair with his heels 1/13; in general his wounds look better not much surface area change we have been using Iodoflex as of last week. I did an x-ray of the right heel as the patient was complaining of pain. I  had some thoughts about a stress fracture perhaps Achilles tendon problems however what it showed was erosive changes along the inferior aspect of the calcaneus he now has a MRI booked for 1/20. 1/20; in general his wounds continue to be better. Some improvement in the large narrow areas proximally in his foot. He is still complaining of pain in the right heel  and tenderness in certain areas of this wound. His MRI is tonight. I am not just looking for osteomyelitis that was brought up on the x-ray I am wondering about stress fractures, tendon ruptures etc. He has no such findings on the left. Also noteworthy is that the patient had critical illness neuropathy and some of the discomfort may be actual improvement in nerve function I am just not sure. These wounds were initially in the setting of severe critical illness related to COVID-19. He was put in a standing position. He may have also been on pressors at the point contributing to tissue ischemia. By his description at some point these wounds were grossly necrotic extending proximally up into the Achilles part of his heel. I do not know that I have ever really seen pictures of them like this although they may exist in epic We have ordered Tri layer Oasis. I am trying to stimulate some granulation in these areas. This is of course assuming the MRI is negative for infection 1/27; since the patient was last here he saw Dr. Juleen China of infectious disease. He is planned for vancomycin and ceftriaxone. Prior operative culture grew MSSA. Also ordered baseline lab work. He also ordered arterial studies although the ABIs in our clinic were normal as well as his clinical exam these were normal I do not think he needs to see vascular surgery. His ABIs at the PTA were 1.22 in the right triphasic waveforms with a normal TBI of 1.15 on the left ABI of 1.22 with triphasic waveforms and a normal TBI of 1.08. Finally he saw Dr. Amalia Hailey who will follow him in for 2 months. At this point I do not think he felt that he needed a procedure on the right calcaneal bone. Dr. Juleen China is elected for broad-spectrum antibiotic The patient is still having pain in the right heel. He walks with a walker 2/3; wounds are generally smaller. He is tolerating his IV antibiotics. I believe this is vancomycin and ceftriaxone. We are still  waiting for Oasis burn in terms of his out-of-pocket max which he should be meeting soon given the IV antibiotics, MRIs etc. I have asked him to check in on this. We are using silver collagen in the meantime the wounds look better Electronic Signature(s) Signed: 09/10/2020 4:57:15 PM By: Linton Ham MD Entered By: Linton Ham on 09/10/2020 09:39:04 -------------------------------------------------------------------------------- Physical Exam Details Patient Name: Date of Service: [name], [place] 09/10/2020 8:45 A M Medical Record Number: [id number] Patient Account Number: [id number] Date of Birth/Sex: Treating RN: [birth year] (45 y.o. [name] Primary Care Provider: [name] Other Clinician: Referring Provider: Treating Provider/Extender: [name] in Treatment: 8 Notes Wound exam He has less pain in the right heel and certainly less tenderness. Still some debris on the surface but no exposed bone I did not debride this. I think the wound bed is coming down in overall size. Left is in a similar condition. The linear part of these wounds towards the center part of his foot is come in nicely since he is starting here. He is tolerating his dressings  well. Electronic Signature(s) Signed: 09/10/2020 4:57:15 PM By: Linton Ham MD Entered By: Linton Ham on 09/10/2020 09:40:09 -------------------------------------------------------------------------------- Physician Orders Details Patient Name: Date of Service: [name], [place] 09/10/2020 8:45 A M Medical Record Number: [id number] Patient Account Number: [id number] Date of Birth/Sex: Treating RN: August 27, 1975 (45 y.o. [name], [name] Primary Care Provider: [name] Other Clinician: Referring Provider: Treating Provider/Extender: [name] in Treatment: 8 Verbal / Phone Orders: No Diagnosis Coding ICD-10 Coding Code Description [phone] Pressure ulcer of other site,  stage 3 M62.81 Muscle weakness (generalized) I10 Essential (primary) hypertension L97.512 Non-pressure chronic ulcer of other part of right foot with fat layer exposed L97.522 Non-pressure chronic ulcer of other 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 M86.171 Other acute osteomyelitis, right ankle and foot Follow-up Appointments Return Appointment in 1 week. Bathing/ Shower/ Hygiene May shower and wash wound with soap and water. - wash wounds with dial antibacterial soap Edema Control - Lymphedema / SCD / Other Bilateral Lower Extremities Elevate legs to the level of the heart or above for 30 minutes daily and/or when sitting, a frequency of: Avoid standing for long periods of time. Off-Loading Other: - keep pressure off of the bottom of your feet Wound Treatment Wound #1 - Calcaneus Wound Laterality: Right Cleanser: Soap and Water 3 x Per Week/30 Days Discharge Instructions: May shower and wash wound with dial antibacterial soap and water prior to dressing change. Prim Dressing: Promogran Prisma Matrix, 4.34 (sq in) (silver collagen) (Generic) 3 x Per Week/30 Days ary Discharge Instructions: Moisten collagen with saline or hydrogel Secondary Dressing: Woven Gauze Sponge, Non-Sterile 4x4 in (Generic) 3 x Per Week/30 Days Discharge Instructions: ensure to fold and apply up to the Collagen to ensure it keeps the Collagen in place next to wound bed. Apply over primary dressing as directed. Secondary Dressing: ABD Pad, 5x9 3 x Per Week/30 Days Discharge Instructions: Apply over primary dressing as directed. Secured With: Elastic Bandage 4 inch (ACE bandage) 3 x Per Week/30 Days Discharge Instructions: Secure with ACE bandage as directed. Secured With: [employer], 4.5x3.1 (in/yd) (Generic) 3 x Per Week/30 Days Discharge Instructions: Secure with Kerlix as directed. Secured With: Paper Tape, 2x10 (in/yd) (Generic) 3 x Per Week/30 Days Discharge Instructions: Secure dressing with  tape as directed. Wound #2 - Calcaneus Wound Laterality: Left Cleanser: Soap and Water 3 x Per Week/30 Days Discharge Instructions: May shower and wash wound with dial antibacterial soap and water prior to dressing change. Prim Dressing: Promogran Prisma Matrix, 4.34 (sq in) (silver collagen) (Generic) 3 x Per Week/30 Days ary Discharge Instructions: Moisten collagen with saline or hydrogel Secondary Dressing: Woven Gauze Sponge, Non-Sterile 4x4 in (Generic) 3 x Per Week/30 Days Discharge Instructions: ensure to fold and apply up to the Collagen to ensure it keeps the Collagen in place next to wound bed. Apply over primary dressing as directed. Secondary Dressing: ABD Pad, 5x9 3 x Per Week/30 Days Discharge Instructions: Apply over primary dressing as directed. Secured With: Elastic Bandage 4 inch (ACE bandage) 3 x Per Week/30 Days Discharge Instructions: Secure with ACE bandage as directed. Secured With: [employer], 4.5x3.1 (in/yd) (Generic) 3 x Per Week/30 Days Discharge Instructions: Secure with Kerlix as directed. Secured With: Paper Tape, 2x10 (in/yd) (Generic) 3 x Per Week/30 Days Discharge Instructions: Secure dressing with tape as directed. Electronic Signature(s) Signed: 09/10/2020 4:57:15 PM By: Linton Ham MD Signed: 09/10/2020 5:30:16 PM By: [name] Entered By: [name] on 09/10/2020  61:60:73 -------------------------------------------------------------------------------- Problem List Details Patient Name: Date of Service: [name], [place] 09/10/2020 8:45 A M Medical Record Number: [id number] Patient Account Number: [id number] Date of Birth/Sex: Treating RN: 1976/05/15 (45 y.o. [name], [name] Primary Care Provider: [name] Other Clinician: Referring Provider: Treating Provider/Extender: [name] in Treatment: 8 Active Problems ICD-10 Encounter Code Description Active Date MDM Diagnosis [phone] Pressure ulcer of  other site, stage 3 07/15/2020 No Yes M62.81 Muscle weakness (generalized) 07/15/2020 No Yes I10 Essential (primary) hypertension 07/15/2020 No Yes L97.512 Non-pressure chronic ulcer of other part of right foot with fat layer exposed 09/03/2020 No Yes L97.522 Non-pressure chronic ulcer of other 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 09/03/2020 No Yes M86.171 Other acute osteomyelitis, right ankle and foot 09/03/2020 No Yes Inactive Problems Resolved Problems Electronic Signature(s) Signed: 09/10/2020 4:57:15 PM By: Linton Ham MD Entered By: Linton Ham on 09/10/2020 09:38:01 -------------------------------------------------------------------------------- Progress Note Details Patient Name: Date of Service: [name], [place] 09/10/2020 8:45 A M Medical Record Number: [id number] Patient Account Number: [id number] Date of Birth/Sex: Treating RN: [birth year] (45 y.o. [name] Primary Care Provider: [name] Other Clinician: Referring Provider: Treating Provider/Extender: [name] in Treatment: 8 Subjective History of Present Illness (HPI) 07/15/2020 upon evaluation today patient presents for initial inspection here in our clinic concerning issues he has been having with the bottoms of his feet bilaterally. He states these actually occurred as wounds when he was hospitalized for 5 months secondary to Covid. He was apparently with tilting bed where he was in an upright position quite frequently and apparently this occurred in some way shape or form during that time. Fortunately there is no sign of active infection at this time. No fevers, chills, nausea, vomiting, or diarrhea. With that being said he still has substantial wounds on the plantar aspects of his feet Theragen require quite a bit of work to get these to heal. He has been using Santyl currently though that is been problematic both in receiving the medication as well as actually paid for it as it is  become quite expensive. Prior to the experience with Covid the patient really did not have any major medical problems other than hypertension he does have some mild generalized weakness following the Covid experience. 07/22/2020 on evaluation today patient appears to be doing okay in regard to his foot ulcers I feel like the wound beds are showing signs of better improvement that I do believe the Iodoflex is helping in this regard. With that being said he does have a lot of drainage currently and this is somewhat blue/green in nature which is consistent with Pseudomonas. I do think a culture today would be appropriate for [place] to evaluate and see if that is indeed the case I would likely start him on antibiotic orally as well he is not allergic to Cipro knows of no issues he has had in the past 12/21; patient was admitted to the clinic earlier this month with bilateral presumed pressure ulcers on the bottom of his feet apparently related to excessive pressure from a tilt table arrangement in the intensive care unit. Patient relates this to being on ECMO but I am not really sure that is exactly related to that. I must say I have never seen anything like this. He has fairly extensive full-thickness wounds extending from his heel towards his midfoot mostly centered laterally. There is already been some healing distally. He does not appear  to have an arterial issue. He has been using gentamicin to the wound surfaces with Iodoflex to help with ongoing debridement 1/6; this is a patient with pressure ulcers on the bottom of his feet related to excessive pressure from a standing position in the intensive care unit. He is complaining of a lot of pain in the right heel. He is not a diabetic. 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 We have been using Iodoflex to help prepare the surfaces of both wounds for an advanced treatment product. He is nonambulatory spending most of his time in a  wheelchair I have asked him not to propel the wheelchair with his heels 1/13; in general his wounds look better not much surface area change we have been using Iodoflex as of last week. I did an x-ray of the right heel as the patient was complaining of pain. I had some thoughts about a stress fracture perhaps Achilles tendon problems however what it showed was erosive changes along the inferior aspect of the calcaneus he now has a MRI booked for 1/20. 1/20; in general his wounds continue to be better. Some improvement in the large narrow areas proximally in his foot. He is still complaining of pain in the right heel and tenderness in certain areas of this wound. His MRI is tonight. I am not just looking for osteomyelitis that was brought up on the x-ray I am wondering about stress fractures, tendon ruptures etc. He has no such findings on the left. Also noteworthy is that the patient had critical illness neuropathy and some of the discomfort may be actual improvement in nerve function I am just not sure. These wounds were initially in the setting of severe critical illness related to COVID-19. He was put in a standing position. He may have also been on pressors at the point contributing to tissue ischemia. By his description at some point these wounds were grossly necrotic extending proximally up into the Achilles part of his heel. I do not know that I have ever really seen pictures of them like this although they may exist in epic We have ordered Tri layer Oasis. I am trying to stimulate some granulation in these areas. This is of course assuming the MRI is negative for infection 1/27; since the patient was last here he saw Dr. Juleen China of infectious disease. He is planned for vancomycin and ceftriaxone. Prior operative culture grew MSSA. Also ordered baseline lab work. He also ordered arterial studies although the ABIs in our clinic were normal as well as his clinical exam these were normal I do not  think he needs to see vascular surgery. His ABIs at the PTA were 1.22 in the right triphasic waveforms with a normal TBI of 1.15 on the left ABI of 1.22 with triphasic waveforms and a normal TBI of 1.08. Finally he saw Dr. Amalia Hailey who will follow him in for 2 months. At this point I do not think he felt that he needed a procedure on the right calcaneal bone. Dr. Juleen China is elected for broad-spectrum antibiotic The patient is still having pain in the right heel. He walks with a walker 2/3; wounds are generally smaller. He is tolerating his IV antibiotics. I believe this is vancomycin and ceftriaxone. We are still waiting for Oasis burn in terms of his out-of-pocket max which he should be meeting soon given the IV antibiotics, MRIs etc. I have asked him to check in on this. We are using silver collagen in  the meantime the wounds look better Objective Constitutional Vitals Time Taken: 8:45 AM, Height: 69 in, Weight: 280 lbs, BMI: 41.3, Temperature: 98.2 F, Pulse: 77 bpm, Respiratory Rate: 18 breaths/min, Blood Pressure: 139/72 mmHg. Integumentary (Hair, Skin) Wound #1 status is Open. Original cause of wound was Pressure Injury. The wound is located on the Right Calcaneus. The wound measures 5.8cm length x 2.3cm width x 0.6cm depth; 10.477cm^2 area and 6.286cm^3 volume. There is Fat Layer (Subcutaneous Tissue) exposed. There is no tunneling or undermining noted. There is a medium amount of serosanguineous drainage noted. The wound margin is thickened. There is medium (34-66%) pink granulation within the wound bed. There is a medium (34-66%) amount of necrotic tissue within the wound bed including Adherent Slough. Wound #2 status is Open. Original cause of wound was Pressure Injury. The wound is located on the Left Calcaneus. The wound measures 7cm length x 2.4cm width x 0.5cm depth; 13.195cm^2 area and 6.597cm^3 volume. There is Fat Layer (Subcutaneous Tissue) exposed. There is no tunneling or  undermining noted. There is a medium amount of serosanguineous drainage noted. The wound margin is indistinct and nonvisible. There is medium (34-66%) pink granulation within the wound bed. There is a medium (34-66%) amount of necrotic tissue within the wound bed including Adherent Slough. Assessment Active Problems ICD-10 Pressure ulcer of other site, stage 3 Muscle weakness (generalized) Essential (primary) hypertension Non-pressure chronic ulcer of other part of right foot with fat layer exposed Non-pressure chronic ulcer of other 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 Other acute osteomyelitis, right ankle and foot Plan Follow-up Appointments: Return Appointment in 1 week. Bathing/ Shower/ Hygiene: May shower and wash wound with soap and water. - wash wounds with dial antibacterial soap Edema Control - Lymphedema / SCD / Other: Elevate legs to the level of the heart or above for 30 minutes daily and/or when sitting, a frequency of: Avoid standing for long periods of time. Off-Loading: Other: - keep pressure off of the bottom of your feet WOUND #1: - Calcaneus Wound Laterality: Right Cleanser: Soap and Water 3 x Per Week/30 Days Discharge Instructions: May shower and wash wound with dial antibacterial soap and water prior to dressing change. Prim Dressing: Promogran Prisma Matrix, 4.34 (sq in) (silver collagen) (Generic) 3 x Per Week/30 Days ary Discharge Instructions: Moisten collagen with saline or hydrogel Secondary Dressing: Woven Gauze Sponge, Non-Sterile 4x4 in (Generic) 3 x Per Week/30 Days Discharge Instructions: ensure to fold and apply up to the Collagen to ensure it keeps the Collagen in place next to wound bed. Apply over primary dressing as directed. Secondary Dressing: ABD Pad, 5x9 3 x Per Week/30 Days Discharge Instructions: Apply over primary dressing as directed. Secured With: Elastic Bandage 4 inch (ACE bandage) 3 x Per Week/30 Days Discharge Instructions:  Secure with ACE bandage as directed. Secured With: [employer], 4.5x3.1 (in/yd) (Generic) 3 x Per Week/30 Days Discharge Instructions: Secure with Kerlix as directed. Secured With: Paper T ape, 2x10 (in/yd) (Generic) 3 x Per Week/30 Days Discharge Instructions: Secure dressing with tape as directed. WOUND #2: - Calcaneus Wound Laterality: Left Cleanser: Soap and Water 3 x Per Week/30 Days Discharge Instructions: May shower and wash wound with dial antibacterial soap and water prior to dressing change. Prim Dressing: Promogran Prisma Matrix, 4.34 (sq in) (silver collagen) (Generic) 3 x Per Week/30 Days ary Discharge Instructions: Moisten collagen with saline or hydrogel Secondary Dressing: Woven Gauze Sponge, Non-Sterile 4x4 in (Generic) 3 x Per Week/30 Days Discharge  Instructions: ensure to fold and apply up to the Collagen to ensure it keeps the Collagen in place next to wound bed. Apply over primary dressing as directed. Secondary Dressing: ABD Pad, 5x9 3 x Per Week/30 Days Discharge Instructions: Apply over primary dressing as directed. Secured With: Elastic Bandage 4 inch (ACE bandage) 3 x Per Week/30 Days Discharge Instructions: Secure with ACE bandage as directed. Secured With: [employer], 4.5x3.1 (in/yd) (Generic) 3 x Per Week/30 Days Discharge Instructions: Secure with Kerlix as directed. Secured With: Paper T ape, 2x10 (in/yd) (Generic) 3 x Per Week/30 Days Discharge Instructions: Secure dressing with tape as directed. 1. Recommend continue with the silver collagen 2. He is on IV antibiotics I think starting into his second week. I had like him to have 2 or 3 weeks and then I will reconsider him for a total contact cast. The Oasis burn we are still waiting for him to meet his out-of-pocket max. Electronic Signature(s) Signed: 09/10/2020 4:57:15 PM By: Linton Ham MD Entered By: Linton Ham on 09/10/2020  09:41:00 -------------------------------------------------------------------------------- SuperBill Details Patient Name: Date of Service: [name], [place]. 09/10/2020 Medical Record Number: [id number] Patient Account Number: [id number] Date of Birth/Sex: Treating RN: 03-12-1976 (45 y.o. [name], [name] Primary Care Provider: [name] Other Clinician: Referring Provider: Treating Provider/Extender: [name] in Treatment: 8 Diagnosis Coding ICD-10 Codes Code Description [phone] Pressure ulcer of other site, stage 3 M62.81 Muscle weakness (generalized) I10 Essential (primary) hypertension L97.512 Non-pressure chronic ulcer of other part of right foot with fat layer exposed L97.522 Non-pressure chronic ulcer of other 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 M86.171 Other acute osteomyelitis, right ankle and foot Facility Procedures CPT4 Code: [id number] Description: [address] - WOUND CARE VISIT-LEV 5 EST PT Modifier: Quantity: 1 Physician Procedures Electronic Signature(s) Signed: 09/10/2020 4:57:15 PM By: Linton Ham MD Entered By: Linton Ham on 09/10/2020 09:41:29

## 2020-09-13 DIAGNOSIS — L03115 Cellulitis of right lower limb: Secondary | ICD-10-CM | POA: Diagnosis not present

## 2020-09-13 DIAGNOSIS — M86171 Other acute osteomyelitis, right ankle and foot: Secondary | ICD-10-CM | POA: Diagnosis not present

## 2020-09-14 NOTE — Progress Notes (Signed)
RIYAD, KEENA ([id number]) Visit Report for 09/10/2020 Arrival Information Details Patient Name: Date of Service: [name], [place] 09/10/2020 8:45 A M Medical Record Number: [id number] Patient Account Number: [id number] Date of Birth/Sex: Treating RN: [birth year] (45 y.o. [name] Primary Care [name]: [name] Other Clinician: Referring [name]: Treating [name] in Treatment: 8 Visit Information History Since Last Visit Added or deleted any medications: No Patient Arrived: Wheel Chair Any new allergies or adverse reactions: No Arrival Time: 08:45 Had a fall or experienced change in No Accompanied By: wife activities of daily living that may affect Transfer Assistance: None risk of falls: Patient Identification Verified: Yes Signs or symptoms of abuse/neglect since last visito No Secondary Verification Process Completed: Yes Hospitalized since last visit: No Patient Requires Transmission-Based Precautions: No Implantable device outside of the clinic excluding No Patient Has Alerts: No cellular tissue based products placed in the center since last visit: Has Dressing in Place as Prescribed: Yes Pain Present Now: No Electronic Signature(s) Signed: 09/14/2020 9:06:40 AM By: [name] Entered By: [name] on 09/10/2020 08:45:23 -------------------------------------------------------------------------------- Clinic Level of Care Assessment Details Patient Name: Date of Service: [name] [name]. 09/10/2020 8:45 A M Medical Record Number: [id number] Patient Account Number: [id number] Date of Birth/Sex: Treating RN: Apr 19, 1976 (45 y.o. [name] Primary Care [name]: [name] Other Clinician: Referring [name]: Treating [name]/Extender: [name] in Treatment: 8 Clinic Level of Care Assessment Items TOOL 4 Quantity Score X- 1 0 Use when only an EandM is performed on  FOLLOW-UP visit ASSESSMENTS - Nursing Assessment / Reassessment X- 1 10 Reassessment of Co-morbidities (includes updates in patient status) X- 1 5 Reassessment of Adherence to Treatment Plan ASSESSMENTS - Wound and Skin A ssessment / Reassessment []  - 0 Simple Wound Assessment / Reassessment - one wound X- 2 5 Complex Wound Assessment / Reassessment - multiple wounds X- 1 10 Dermatologic / Skin Assessment (not related to wound area) ASSESSMENTS - Focused Assessment X- 2 5 Circumferential Edema Measurements - multi extremities X- 1 10 Nutritional Assessment / Counseling / Intervention []  - 0 Lower Extremity Assessment (monofilament, tuning fork, pulses) []  - 0 Peripheral Arterial Disease Assessment (using hand held doppler) ASSESSMENTS - Ostomy and/or Continence Assessment and Care []  - 0 Incontinence Assessment and Management []  - 0 Ostomy Care Assessment and Management (repouching, etc.) PROCESS - Coordination of Care []  - 0 Simple Patient / Family Education for ongoing care X- 1 20 Complex (extensive) Patient / Family Education for ongoing care X- 1 10 Staff obtains Programmer, systems, Records, T Results / Process Orders est []  - 0 Staff telephones HHA, Nursing Homes / Clarify orders / etc []  - 0 Routine Transfer to another Facility (non-emergent condition) []  - 0 Routine Hospital Admission (non-emergent condition) []  - 0 New Admissions / Biomedical engineer / Ordering NPWT Apligraf, etc. , []  - 0 Emergency Hospital Admission (emergent condition) []  - 0 Simple Discharge Coordination X- 1 15 Complex (extensive) Discharge Coordination PROCESS - Special Needs []  - 0 Pediatric / Minor Patient Management []  - 0 Isolation Patient Management []  - 0 Hearing / Language / Visual special needs []  - 0 Assessment of Community assistance (transportation, D/C planning, etc.) []  - 0 Additional assistance / Altered mentation []  - 0 Support Surface(s) Assessment (bed,  cushion, seat, etc.) INTERVENTIONS - Wound Cleansing / Measurement []  - 0 Simple Wound Cleansing - one wound X- 2 5 Complex Wound Cleansing - multiple wounds  X- 1 5 Wound Imaging (photographs - any number of wounds) []  - 0 Wound Tracing (instead of photographs) []  - 0 Simple Wound Measurement - one wound X- 2 5 Complex Wound Measurement - multiple wounds INTERVENTIONS - Wound Dressings []  - 0 Small Wound Dressing one or multiple wounds X- 2 15 Medium Wound Dressing one or multiple wounds []  - 0 Large Wound Dressing one or multiple wounds []  - 0 Application of Medications - topical []  - 0 Application of Medications - injection INTERVENTIONS - Miscellaneous []  - 0 External ear exam []  - 0 Specimen Collection (cultures, biopsies, blood, body fluids, etc.) []  - 0 Specimen(s) / Culture(s) sent or taken to Lab for analysis []  - 0 Patient Transfer (multiple staff / Civil Service fast streamer / Similar devices) []  - 0 Simple Staple / Suture removal (25 or less) []  - 0 Complex Staple / Suture removal (26 or more) []  - 0 Hypo / Hyperglycemic Management (close monitor of Blood Glucose) []  - 0 Ankle / Brachial Index (ABI) - do not check if billed separately X- 1 5 Vital Signs Has the patient been seen at the hospital within the last three years: Yes Total Score: 160 Level Of Care: New/Established - Level 5 Electronic Signature(s) Signed: 09/10/2020 5:30:16 PM By: [name] Entered By: [name] on 09/10/2020 09:25:14 -------------------------------------------------------------------------------- Encounter Discharge Information Details Patient Name: Date of Service: [name], [name] 09/10/2020 8:45 A M Medical Record Number: [id number] Patient Account Number: [id number] Date of Birth/Sex: Treating RN: [birth year] (45 y.o. [name] Primary Care Sharline Lehane: [name] Other Clinician: Referring [name]: Treating [name]/Extender: [name] in  Treatment: 8 Encounter Discharge Information Items Discharge Condition: Stable Ambulatory Status: Wheelchair Discharge Destination: Home Transportation: Private Auto Accompanied By: spouse Schedule Follow-up Appointment: Yes Clinical Summary of Care: Patient Declined Electronic Signature(s) Signed: 09/10/2020 5:53:40 PM By: Baruch Gouty RN, BSN Entered By: Baruch Gouty on 09/10/2020 09:44:49 -------------------------------------------------------------------------------- Lower Extremity Assessment Details Patient Name: Date of Service: [name], [place] 09/10/2020 8:45 A M Medical Record Number: [id number] Patient Account Number: [id number] Date of Birth/Sex: Treating RN: 1976/06/17 (45 y.o. [name] Primary Care Hareem Surowiec: [name] Other Clinician: Referring Jailen Coward: Treating Krisy Dix/Extender: [name] in Treatment: 8 Edema Assessment Assessed: [name]: No] [name]: No] Edema: [Left: Yes] [Right: Yes] Calf Left: Right: Point of Measurement: 34 cm From Medial Instep 38 cm 40.5 cm Ankle Left: Right: Point of Measurement: 12 cm From Medial Instep 24 cm 25 cm Vascular Assessment Pulses: Dorsalis Pedis Palpable: [Left:Yes] [Right:Yes] Electronic Signature(s) Signed: 09/11/2020 4:51:44 PM By: Levan Hurst RN, BSN Entered By: Levan Hurst on 09/10/2020 09:11:18 -------------------------------------------------------------------------------- Multi Wound Chart Details Patient Name: Date of Service: [name], [place] 09/10/2020 8:45 A M Medical Record Number: [id number] Patient Account Number: [id number] Date of Birth/Sex: Treating RN: 08-May-1976 (45 y.o. [name] Primary Care Edeline Greening: [name] Other Clinician: Referring Fabiana Dromgoole: Treating Michaelann Gunnoe/Extender: [name] in Treatment: 8 Vital Signs Height(in): 43 Pulse(bpm): 70 Weight(lbs): 280 Blood Pressure(mmHg): 139/72 Body Mass Index(BMI):  41 Temperature(F): 98.2 Respiratory Rate(breaths/min): 18 Photos: [1:No Photos Right Calcaneus] [2:No Photos Left Calcaneus] [N/A:N/A N/A] Wound Location: [1:Pressure Injury] [2:Pressure Injury] [N/A:N/A] Wounding Event: [1:Pressure Ulcer] [2:Pressure Ulcer] [N/A:N/A] Primary Etiology: [1:Asthma, Angina, Hypertension] [2:Asthma, Angina, Hypertension] [N/A:N/A] Comorbid History: [1:10/07/2019] [2:10/07/2019] [N/A:N/A] Date Acquired: [1:8] [2:8] [N/A:N/A] Weeks of Treatment: [1:Open] [2:Open] [N/A:N/A] Wound Status: [1:5.8x2.3x0.6] [2:7x2.4x0.5] [N/A:N/A] Measurements L x W x D (cm) [1:10.477] [2:13.195] [N/A:N/A]  A (cm) : rea [1:6.286] [4:3.154] [N/A:N/A] Volume (cm) : [1:66.90%] [2:51.30%] [N/A:N/A] % Reduction in A rea: [1:80.10%] [2:75.70%] [N/A:N/A] % Reduction in Volume: [1:Category/Stage III] [2:Category/Stage III] [N/A:N/A] Classification: [1:Medium] [2:Medium] [N/A:N/A] Exudate A mount: [1:Serosanguineous] [2:Serosanguineous] [N/A:N/A] Exudate Type: [1:red, brown] [2:red, brown] [N/A:N/A] Exudate Color: [1:Thickened] [2:Indistinct, nonvisible] [N/A:N/A] Wound Margin: [1:Medium (34-66%)] [2:Medium (34-66%)] [N/A:N/A] Granulation A mount: [1:Pink] [2:Pink] [N/A:N/A] Granulation Quality: [1:Medium (34-66%)] [2:Medium (34-66%)] [N/A:N/A] Necrotic A mount: [1:Fat Layer (Subcutaneous Tissue): Yes Fat Layer (Subcutaneous Tissue): Yes N/A] Exposed Structures: [1:Fascia: No Tendon: No Muscle: No Joint: No Bone: No Small (1-33%)] [2:Fascia: No Tendon: No Muscle: No Joint: No Bone: No Small (1-33%)] [N/A:N/A] Treatment Notes Electronic Signature(s) Signed: 09/10/2020 4:57:15 PM By: Linton Ham MD Signed: 09/10/2020 5:30:16 PM By: [name] Entered By: Linton Ham on 09/10/2020 09:38:08 -------------------------------------------------------------------------------- Multi-Disciplinary Care Plan Details Patient Name: Date of Service: [name], [place] 09/10/2020 8:45 A  M Medical Record Number: [id number] Patient Account Number: [id number] Date of Birth/Sex: Treating RN: [birth year] (45 y.o. [name] Primary Care Grecia Lynk: [name] Other Clinician: Referring Dymon Summerhill: Treating Menna Abeln/Extender: [name] in Treatment: 8 Active Inactive Abuse / Safety / Falls / Self Care Management Nursing Diagnoses: Potential for falls Goals: Patient/caregiver will verbalize/demonstrate measures taken to prevent injury and/or falls Date Initiated: 07/15/2020 Target Resolution Date: 11/06/2020 Goal Status: Active Interventions: Assess fall risk on admission and as needed Assess impairment of mobility on admission and as needed per policy Notes: Wound/Skin Impairment Nursing Diagnoses: Impaired tissue integrity Knowledge deficit related to ulceration/compromised skin integrity Goals: Patient/caregiver will verbalize understanding of skin care regimen Date Initiated: 07/15/2020 Target Resolution Date: 12/04/2020 Goal Status: Active Ulcer/skin breakdown will have a volume reduction of 30% by week 4 Date Initiated: 07/15/2020 Date Inactivated: 08/20/2020 Target Resolution Date: 09/03/2020 Goal Status: Unmet Unmet Reason: no major changes. Interventions: Assess patient/caregiver ability to obtain necessary supplies Assess patient/caregiver ability to perform ulcer/skin care regimen upon admission and as needed Assess ulceration(s) every visit Provide education on ulcer and skin care Treatment Activities: Skin care regimen initiated : 07/15/2020 Topical wound management initiated : 07/15/2020 Notes: Electronic Signature(s) Signed: 09/10/2020 5:30:16 PM By: [name] Entered By: [name] on 09/10/2020 08:51:38 -------------------------------------------------------------------------------- Pain Assessment Details Patient Name: Date of Service: [name], [place]. 09/10/2020 8:45 A M Medical Record Number: [id number] Patient  Account Number: [id number] Date of Birth/Sex: Treating RN: [birth year] (45 y.o. [name] Primary Care Vanissa Strength: [name] Other Clinician: Referring Yvanna Vidas: Treating Antiono Ettinger/Extender: [name] in Treatment: 8 Active Problems Location of Pain Severity and Description of Pain Patient Has Paino No Site Locations Pain Management and Medication Current Pain Management: Electronic Signature(s) Signed: 09/10/2020 5:30:16 PM By: [name] Signed: 09/14/2020 9:06:40 AM By: [name] Entered By: [name] on 09/10/2020 08:47:30 -------------------------------------------------------------------------------- Patient/Caregiver Education Details Patient Name: Date of Service: [name] 2/3/2022andnbsp8:45 [place] Record Number: [id number] Patient Account Number: [id number] Date of Birth/Gender: Treating RN: [birth year] (45 y.o. [name] Primary Care Physician: [name] Other Clinician: Referring Physician: Treating Physician/Extender: [name] in Treatment: 8 Education Assessment Education Provided To: Patient Education Topics Provided Wound/Skin Impairment: Handouts: Skin Care Do's and Dont's Methods: Explain/Verbal Responses: Reinforcements needed Electronic Signature(s) Signed: 09/10/2020 5:30:16 PM By: [name] Entered By: [name] on 09/10/2020 08:51:50 -------------------------------------------------------------------------------- Wound Assessment Details Patient Name: Date of Service: [name], [place] 09/10/2020 8:45 A M Medical Record Number: [id number]  Patient Account Number: [id number] Date of Birth/Sex: Treating RN: 03-17-1976 (45 y.o. [name], [name] Primary Care [name]: [name] Other Clinician: Referring Lurleen Soltero: Treating Jamesia Linnen/Extender: [name] in Treatment: 8 Wound Status Wound Number: 1 Primary Etiology: Pressure  Ulcer Wound Location: Right Calcaneus Wound Status: Open Wounding Event: Pressure Injury Comorbid History: Asthma, Angina, Hypertension Date Acquired: 10/07/2019 Weeks Of Treatment: 8 Clustered Wound: No Photos Photo Uploaded By: Mikeal Hawthorne on 09/11/2020 15:23:05 Wound Measurements Length: (cm) 5.8 Width: (cm) 2.3 Depth: (cm) 0.6 Area: (cm) 10.477 Volume: (cm) 6.286 % Reduction in Area: 66.9% % Reduction in Volume: 80.1% Epithelialization: Small (1-33%) Tunneling: No Undermining: No Wound Description Classification: Category/Stage III Wound Margin: Thickened Exudate Amount: Medium Exudate Type: Serosanguineous Exudate Color: red, brown Foul Odor After Cleansing: No Slough/Fibrino Yes Wound Bed Granulation Amount: Medium (34-66%) Exposed Structure Granulation Quality: Pink Fascia Exposed: No Necrotic Amount: Medium (34-66%) Fat Layer (Subcutaneous Tissue) Exposed: Yes Necrotic Quality: Adherent Slough Tendon Exposed: No Muscle Exposed: No Joint Exposed: No Bone Exposed: No Treatment Notes Wound #1 (Calcaneus) Wound Laterality: Right Cleanser Soap and Water Discharge Instruction: May shower and wash wound with dial antibacterial soap and water prior to dressing change. Peri-Wound Care Topical Primary Dressing Promogran Prisma Matrix, 4.34 (sq in) (silver collagen) Discharge Instruction: Moisten collagen with saline or hydrogel Secondary Dressing Woven Gauze Sponge, Non-Sterile 4x4 in Discharge Instruction: ensure to fold and apply up to the Collagen to ensure it keeps the Collagen in place next to wound bed. Apply over primary dressing as directed. ABD Pad, 5x9 Discharge Instruction: Apply over primary dressing as directed. Secured With Elastic Bandage 4 inch (ACE bandage) Discharge Instruction: Secure with ACE bandage as directed. Kerlix Roll Sterile, 4.5x3.1 (in/yd) Discharge Instruction: Secure with Kerlix as directed. Paper Tape, 2x10  (in/yd) Discharge Instruction: Secure dressing with tape as directed. Compression Wrap Compression Stockings Add-Ons Electronic Signature(s) Signed: 09/10/2020 5:30:16 PM By: [name] Signed: 09/11/2020 4:51:44 PM By: Levan Hurst RN, BSN Entered By: Levan Hurst on 09/10/2020 09:10:24 -------------------------------------------------------------------------------- Wound Assessment Details Patient Name: Date of Service: [name], [place] 09/10/2020 8:45 A M Medical Record Number: [id number] Patient Account Number: [id number] Date of Birth/Sex: Treating RN: 1975-12-04 (45 y.o. [name], [name] Primary Care Satara Virella: [name] Other Clinician: Referring Lamiya Naas: Treating Jabri Blancett/Extender: [name] in Treatment: 8 Wound Status Wound Number: 2 Primary Etiology: Pressure Ulcer Wound Location: Left Calcaneus Wound Status: Open Wounding Event: Pressure Injury Comorbid History: Asthma, Angina, Hypertension Date Acquired: 10/07/2019 Weeks Of Treatment: 8 Clustered Wound: No Photos Photo Uploaded By: Mikeal Hawthorne on 09/11/2020 15:23:15 Wound Measurements Length: (cm) 7 Width: (cm) 2.4 Depth: (cm) 0.5 Area: (cm) 13.195 Volume: (cm) 6.597 % Reduction in Area: 51.3% % Reduction in Volume: 75.7% Epithelialization: Small (1-33%) Tunneling: No Undermining: No Wound Description Classification: Category/Stage III Wound Margin: Indistinct, nonvisible Exudate Amount: Medium Exudate Type: Serosanguineous Exudate Color: red, brown Foul Odor After Cleansing: No Slough/Fibrino Yes Wound Bed Granulation Amount: Medium (34-66%) Exposed Structure Granulation Quality: Pink Fascia Exposed: No Necrotic Amount: Medium (34-66%) Fat Layer (Subcutaneous Tissue) Exposed: Yes Necrotic Quality: Adherent Slough Tendon Exposed: No Muscle Exposed: No Joint Exposed: No Bone Exposed: No Treatment Notes Wound #2 (Calcaneus) Wound Laterality:  Left Cleanser Soap and Water Discharge Instruction: May shower and wash wound with dial antibacterial soap and water prior to dressing change. Peri-Wound Care Topical Primary Dressing Promogran Prisma Matrix, 4.34 (sq in) (silver collagen) Discharge Instruction: Moisten collagen with saline or hydrogel Secondary Dressing  Woven Gauze Sponge, Non-Sterile 4x4 in Discharge Instruction: ensure to fold and apply up to the Collagen to ensure it keeps the Collagen in place next to wound bed. Apply over primary dressing as directed. ABD Pad, 5x9 Discharge Instruction: Apply over primary dressing as directed. Secured With Elastic Bandage 4 inch (ACE bandage) Discharge Instruction: Secure with ACE bandage as directed. Kerlix Roll Sterile, 4.5x3.1 (in/yd) Discharge Instruction: Secure with Kerlix as directed. Paper Tape, 2x10 (in/yd) Discharge Instruction: Secure dressing with tape as directed. Compression Wrap Compression Stockings Add-Ons Electronic Signature(s) Signed: 09/10/2020 5:30:16 PM By: [name] Signed: 09/11/2020 4:51:44 PM By: Levan Hurst RN, BSN Entered By: Levan Hurst on 09/10/2020 09:12:01 -------------------------------------------------------------------------------- Vitals Details Patient Name: Date of Service: [name], [place] 09/10/2020 8:45 A M Medical Record Number: [id number] Patient Account Number: [id number] Date of Birth/Sex: Treating RN: [birth year] (45 y.o. [name] Primary Care Elivia Robotham: [name] Other Clinician: Referring Ardena Gangl: Treating Phu Record/Extender: [name] in Treatment: 8 Vital Signs Time Taken: 08:45 Temperature (F): 98.2 Height (in): 69 Pulse (bpm): 77 Weight (lbs): 280 Respiratory Rate (breaths/min): 18 Body Mass Index (BMI): 41.3 Blood Pressure (mmHg): 139/72 Reference Range: 80 - 120 mg / dl Electronic Signature(s) Signed: 09/14/2020 9:06:40 AM By: [name] Entered By: [name] on 09/10/2020 08:47:26

## 2020-09-15 ENCOUNTER — Encounter: Payer: Self-pay | Admitting: Neurology

## 2020-09-15 ENCOUNTER — Ambulatory Visit: Payer: BC Managed Care – PPO | Admitting: Neurology

## 2020-09-15 VITALS — BP 137/84 | HR 79 | Ht 68.5 in | Wt 296.0 lb

## 2020-09-15 DIAGNOSIS — R0609 Other forms of dyspnea: Secondary | ICD-10-CM | POA: Diagnosis not present

## 2020-09-15 DIAGNOSIS — L89890 Pressure ulcer of other site, unstageable: Secondary | ICD-10-CM | POA: Diagnosis not present

## 2020-09-15 DIAGNOSIS — Z431 Encounter for attention to gastrostomy: Secondary | ICD-10-CM

## 2020-09-15 DIAGNOSIS — E440 Moderate protein-calorie malnutrition: Secondary | ICD-10-CM

## 2020-09-15 DIAGNOSIS — Z430 Encounter for attention to tracheostomy: Secondary | ICD-10-CM

## 2020-09-15 DIAGNOSIS — G7281 Critical illness myopathy: Secondary | ICD-10-CM

## 2020-09-15 DIAGNOSIS — G6281 Critical illness polyneuropathy: Secondary | ICD-10-CM

## 2020-09-15 DIAGNOSIS — M8610 Other acute osteomyelitis, unspecified site: Secondary | ICD-10-CM

## 2020-09-15 DIAGNOSIS — Z6841 Body Mass Index (BMI) 40.0 and over, adult: Secondary | ICD-10-CM

## 2020-09-15 DIAGNOSIS — D696 Thrombocytopenia, unspecified: Secondary | ICD-10-CM

## 2020-09-15 DIAGNOSIS — U099 Post covid-19 condition, unspecified: Secondary | ICD-10-CM

## 2020-09-15 DIAGNOSIS — T85693S Other mechanical complication of artificial skin graft and decellularized allodermis, sequela: Secondary | ICD-10-CM

## 2020-09-15 DIAGNOSIS — J8409 Other alveolar and parieto-alveolar conditions: Secondary | ICD-10-CM

## 2020-09-15 HISTORY — DX: Post covid-19 condition, unspecified: U09.9

## 2020-09-15 HISTORY — DX: Moderate protein-calorie malnutrition: E44.0

## 2020-09-15 HISTORY — DX: Critical illness polyneuropathy: G62.81

## 2020-09-15 HISTORY — DX: Encounter for attention to tracheostomy: Z43.0

## 2020-09-15 HISTORY — DX: Other forms of dyspnea: R06.09

## 2020-09-15 HISTORY — DX: Morbid (severe) obesity due to excess calories: E66.01

## 2020-09-15 NOTE — Progress Notes (Signed)
SLEEP MEDICINE CLINIC    Provider:  Larey Seat, MD  Primary Care Physician:  Michael Boston, [place] [address]     Referring Provider: Michael Boston, [name],  [place] [id number]          Chief Complaint according to patient   Patient presents with:    . New Patient (Initial Visit)           HISTORY OF PRESENT ILLNESS:  [name] is a 45 y.o. year old White or Caucasian male patient seen here by dr Jannifer Franklin for critical illness neuropathy since surviving COVID 07-2019. Here upon consultation requested by MD on 09/15/2020  Chief concern according to patient : The patient presents today in a wheelchair but he assured me that he is able to transfer Dr. Jannifer Franklin this physical exam notes are here and his last encounter with the patient was on 07-29-2020.  The patient's edema and his sores have been healing.  He received a prescription for a rolling walker by Dr. Jannifer Franklin.  He was encouraged to do strengthening exercises.  He does have difficulty with focusing which is a common post Covid finding fatigue and daytime drowsiness but he also has trouble breathing well while sleeping- he likely has had this condiy tion for a few years, related to obesity.     [name]  has a past medical history of Long term Covid, long hauler- critical illness myopathy, neuropathy, orthopnea. Anginal pain ([place]), Anxiety, Asthma, Dyspnea, GERD (gastroesophageal reflux disease), Headache, History of kidney stones, HTN (hypertension), Pancreatitis (2018), MORBID OBESITY grade 3 , and COVID related ICU stay with acute repiratory failure , 2 month ICU-  All related to COVID 19 Pneumonia (07/2019). Hospital ICU 2 month 3 month rehab.        Family medical /sleep history: Father  on CPAP with OSA, insomnia, sleep walkers.    Social history:  Patient was working for an [employer],  and lives in a household with wife and son - 2 dogs. Tobacco use: none .  ETOH use ;  socially,  Caffeine intake in form of Soda at lunch, some  energy drinks.   Sleep habits are as follows: The patient's dinner time is between 5-7 PM. The patient goes to bed at midnight and listens to music, continues to sleep for 4 hours, before he wakes for  bathroom break. Most of the th ime going back to sleep, .   The preferred sleep position is on his sides , sometimes supine, with the support of 2 pillows. He sleeps in a hospital bed. Dreams are reportedly frequent. Nightmares initially after ICU.  There is no usual rise time, but latest at 9 AM . The patient wakes up with alarm.  He reports not feeling refreshed or restored in AM, with symptoms such as dry mouth, morning headaches, and residual fatigue.  Total sleep time 5-7 hours.  Naps are taken infrequently, lasting from 20-40 minutes and are more refreshing than nocturnal sleep.    Review of Systems: Out of a complete 14 system review, the patient complains of only the following symptoms, and all other reviewed systems are negative.:  Fatigue, sleepiness , snoring, fragmented sleep, nocturia, sob, fatigue ,myalgia, weakness. Sometimes RLS.     How likely are you to doze in the following situations: 0 = not likely, 1 = slight chance, 2 = moderate chance, 3 = high chance   Sitting  and Reading? Watching Television? Sitting inactive in a public place (theater or meeting)? As a passenger in a car for an hour without a break? Lying down in the afternoon when circumstances permit? Sitting and talking to someone? Sitting quietly after lunch without alcohol? In a car, while stopped for a few minutes in traffic?   Total = 8 / 24 points   FSS endorsed at 47/ 63 points.   Social History   Socioeconomic History  . Marital status: Married    Spouse name: Not on file  . Number of children: 1  . Years of education: Not on file  . Highest education level: Not on file  Occupational History  . Occupation: delivery driver  Tobacco Use   . Smoking status: Never Smoker  . Smokeless tobacco: Never Used  Vaping Use  . Vaping Use: Never used  Substance and Sexual Activity  . Alcohol use: Yes    Comment: rarely 2-3 times a year  . Drug use: No  . Sexual activity: Yes    Partners: Female  Other Topics Concern  . Not on file  Social History Narrative  . Not on file   Social Determinants of Health   Financial Resource Strain: Not on file  Food Insecurity: Not on file  Transportation Needs: Not on file  Physical Activity: Not on file  Stress: Not on file  Social Connections: Not on file    Family History  Problem Relation Age of Onset  . Asthma Brother   . Diabetes Father   . Hypertension Father   . Diabetes Paternal Grandmother   . Hypertension Paternal Grandmother   . Migraines Mother   . GER disease Mother   . Pancreatic cancer Maternal Grandmother   . Heart disease Maternal Grandfather     Past Medical History:  Diagnosis Date  . Anginal pain ([place])    with covid  . Anxiety   . Asthma   . Dyspnea   . GERD (gastroesophageal reflux disease)   . Headache   . History of kidney stones    LEFT URETERAL STONE  . HTN (hypertension)   . Pancreatitis 2018   GALLBALDDER SLUDGE CAUSED ISSUED RESOLVED  . Pneumonia 07/2019   covid    Past Surgical History:  Procedure Laterality Date  . CANNULATION FOR ECMO (EXTRACORPOREAL MEMBRANE OXYGENATION) N/A 08/28/2019   Procedure: CANNULATION FOR VV ECMO (EXTRACORPOREAL MEMBRANE OXYGENATION);  Surgeon: Prescott Gum, Collier Salina, MD;  Location: [facility];  Service: Open Heart Surgery;  Laterality: N/A;  CRESCENT CANNULA  . CANNULATION FOR ECMO (EXTRACORPOREAL MEMBRANE OXYGENATION) N/A 09/10/2019   Procedure: CANNULATION FOR ECMO (EXTRACORPOREAL MEMBRANE OXYGENATION) PUTTING IN CRESCENT 32FR CANNULA  AND REMOVING GROING CANNULATION;  Surgeon: Wonda Olds, MD;  Location: [facility];  Service: Open Heart Surgery;  Laterality: N/A;  PUTTING IN CRESCENT/REMOVING GROIN CANNULATION  .  CYSTOSCOPY/URETEROSCOPY/HOLMIUM LASER/STENT PLACEMENT Left 04/18/2019   Procedure: LEFT URETEROSCOPY/HOLMIUM LASER/STENT PLACEMENT;  Surgeon: Ardis Hughs, MD;  Location: [facility];  Service: Urology;  Laterality: Left;  . CYSTOSCOPY/URETEROSCOPY/HOLMIUM LASER/STENT PLACEMENT Left 05/02/2019   Procedure: CYSTOSCOPY/URETEROSCOPY/HOLMIUM LASER/STENT EXCHANGE;  Surgeon: Ardis Hughs, MD;  Location: [facility];  Service: Urology;  Laterality: Left;  . ECMO CANNULATION N/A 08/03/2019   Procedure: ECMO CANNULATION;  Surgeon: Wonda Olds, MD;  Location: [facility];  Service: Cardiovascular;  Laterality: N/A;  . ESOPHAGOGASTRODUODENOSCOPY N/A 09/11/2019   Procedure: ESOPHAGOGASTRODUODENOSCOPY (EGD);  Surgeon: Wonda Olds, MD;  Location: [facility];  Service: Thoracic;  Laterality: N/A;  . GRAFT APPLICATION Bilateral 8/54/6270   Procedure: APPLICATION OF SKIN GRAFT BILATERAL FEET;  Surgeon: Edrick Kins, DPM;  Location: [facility];  Service: Podiatry;  Laterality: Bilateral;  . IR REPLC GASTRO/COLONIC TUBE PERCUT W/FLUORO  10/14/2019  . IRRIGATION AND DEBRIDEMENT SHOULDER Right 09/29/2017   Procedure: IRRIGATION AND DEBRIDEMENT SHOULDER;  Surgeon: Leandrew Koyanagi, MD;  Location: [facility];  Service: Orthopedics;  Laterality: Right;  . [place] SURGERY  2002  . NASAL ENDOSCOPY WITH EPISTAXIS CONTROL N/A 08/31/2019   Procedure: NASAL ENDOSCOPY WITH EPISTAXIS CONTROL WITH CAUTERIZATION;  Surgeon: Melida Quitter, MD;  Location: [facility];  Service: ENT;  Laterality: N/A;  . PORTACATH PLACEMENT N/A 09/11/2019   Procedure: PEG TUBE INSERTION - BEDSIDE;  Surgeon: Wonda Olds, MD;  Location: [facility];  Service: Thoracic;  Laterality: N/A;  . TEE WITHOUT CARDIOVERSION N/A 08/28/2019   Procedure: TRANSESOPHAGEAL ECHOCARDIOGRAM (TEE);  Surgeon: Prescott Gum, Collier Salina, MD;  Location: [facility];  Service: Open Heart Surgery;  Laterality: N/A;  . TEE WITHOUT CARDIOVERSION N/A 09/10/2019   Procedure:  TRANSESOPHAGEAL ECHOCARDIOGRAM (TEE);  Surgeon: Wonda Olds, MD;  Location: [facility];  Service: Open Heart Surgery;  Laterality: N/A;  . WOUND DEBRIDEMENT Bilateral 03/27/2020   Procedure: EXCISIONAL DEBRIDEMENT OF ULCERS BILATERAL FEET;  Surgeon: Edrick Kins, DPM;  Location: [facility];  Service: Podiatry;  Laterality: Bilateral;     Current Outpatient Medications on File Prior to Visit  Medication Sig Dispense Refill  . Albuterol Sulfate (PROAIR RESPICLICK) 350 (90 Base) MCG/ACT AEPB Use 1 puff as needed    . amLODipine (NORVASC) 2.5 MG tablet Take 1 tablet (2.5 mg total) by mouth daily. 30 tablet 5  . amLODipine (NORVASC) 2.5 MG tablet Take 1 tablet by mouth daily.    . budesonide-formoterol (SYMBICORT) 160-4.5 MCG/ACT inhaler 2 puffs    . cefTRIAXone (ROCEPHIN) 1 g SOLR injection Inject 1 g into the vein.    . daptomycin (CUBICIN) IVPB Inject into the vein.    . DULoxetine (CYMBALTA) 60 MG capsule Take 1 capsule (60 mg total) by mouth daily. 90 capsule 3  . fexofenadine (ALLEGRA) 180 MG tablet 1 tablet    . HYDROcodone-acetaminophen (NORCO) 5-325 MG tablet Take 1 tablet by mouth 2 (two) times daily as needed for severe pain. 60 tablet 0  . metoprolol tartrate (LOPRESSOR) 25 MG tablet TAKE 1 TABLET BY MOUTH TWICE A DAY 180 tablet 3  . montelukast (SINGULAIR) 10 MG tablet Take 10 mg by mouth daily.    . pantoprazole (PROTONIX) 40 MG tablet Take 1 tablet (40 mg total) by mouth daily.    . SYMBICORT 160-4.5 MCG/ACT inhaler Inhale 2 puffs into the lungs daily as needed (cough or wheeze). Inhale two puffs twice daily 10.2 g 5  . traZODone (DESYREL) 100 MG tablet Take 1-2 tablets (100-200 mg total) by mouth at bedtime. 60 tablet 5  . triamcinolone (NASACORT) 55 MCG/ACT AERO nasal inhaler Place 2 sprays into the nose daily.     No current facility-administered medications on file prior to visit.    No Known Allergies  Physical exam:  Today's Vitals   09/15/20 0850  BP: 137/84  Pulse: 79   Weight: 296 lb (134.3 kg)  Height: 5' 8.5" (1.74 m)   Body mass index is 44.35 kg/m.   Wt Readings from Last 3 Encounters:  09/15/20 296 lb (134.3 kg)  09/09/20 299 lb 3.2 oz (135.7 kg)  07/01/20 285 lb (129.3 kg)  Ht Readings from Last 3 Encounters:  09/15/20 5' 8.5" (1.74 m)  09/09/20 5' 8"  (1.727 m)  07/01/20 5' 8"  (1.727 m)      General: The patient is awake, alert and in a wheelchair. The patient is well groomed. Head: Normocephalic, atraumatic. Neck is supple. Mallampati 2 ,  neck circumference:21 inches . Nasal airflow  patent.  small lower jaw, narrow, large tongue.  Dental status: biological  Cardiovascular:  Regular rate and cardiac rhythm by pulse,  without distended neck veins. Respiratory: Lungs are clear to auscultation.  Skin:  Without evidence of ankle edema, or rash. Trunk: The patient's posture is erect.   Neurologic exam : The patient is awake and alert, oriented to place and time.   Memory subjective described as intact.  Attention span & concentration ability appears normal.  Speech is fluent,  without  dysarthria, dysphonia or aphasia.  Mood and affect are appropriate.   Cranial nerves: no loss of smell or taste reported  Pupils are equal and briskly reactive to light. Funduscopic exam deferred.  Extraocular movements in vertical and horizontal planes were intact and without nystagmus. No Diplopia. Visual fields by finger perimetry are intact. Hearing was intact to soft voice and finger rubbing.    Facial sensation intact to fine touch.   Facial motor strength is symmetric and tongue and uvula move midline.  Neck ROM : rotation, tilt and flexion extension were normal for age and shoulder shrug was symmetrical.    Motor exam:  Symmetric bulk, tone and ROM.   Normal tone without cog wheeling, symmetric grip strength .subjective right hand feels weaker.    Sensory: feet and hands are numbish,   Coordination: Rapid alternating movements in the  fingers/hands were of normal speed. Handwriting has recovered.  The Finger-to-nose maneuver was intact without evidence of ataxia, dysmetria or tremor.   Gait and station:  Deep tendon reflexes: deferred.       After spending a total time of  35 minutes face to face and additional time for physical and neurologic examination, review of laboratory studies,  personal review of imaging studies, reports and results of other testing and review of referral information / records as far as provided in visit, I have established the following assessments:  1)  Critical Illness neuropathy, weakness and numbness related to ICU stay , related to acute respiratory failure due to COVID 19.  2)  Asthma  And likely OSA pre COVID.  3)  More fatigued than sleepy.  4) anosmia and ageusia, in slow recovery.    My Plan is to proceed with:  1)  HST or PSG.  We need to screen for apnea and for hypoxemia.    I would like to thank Jacalyn Lefevre Jesse Sans, MD and Michael Boston, Md [address] [place],  [place] [address] for allowing me to meet with and to take care of this pleasant patient.    I plan to follow up either personally or through our NP within 3-4 month.   CC: I will share my notes with Dr Jannifer Franklin, MD.   Electronically signed by: Larey Seat, MD 09/15/2020 9:45 AM  [facility] Sleep Board certified by The [employer] of Sleep Medicine and Diplomate of the [employer] of Sleep Medicine. Board certified In Neurology through the [employer], Fellow of the [employer] of Neurology. Medical Director of [employer].

## 2020-09-16 ENCOUNTER — Telehealth: Payer: Self-pay | Admitting: Neurology

## 2020-09-16 DIAGNOSIS — M86171 Other acute osteomyelitis, right ankle and foot: Secondary | ICD-10-CM | POA: Diagnosis not present

## 2020-09-16 DIAGNOSIS — L03115 Cellulitis of right lower limb: Secondary | ICD-10-CM | POA: Diagnosis not present

## 2020-09-16 NOTE — Telephone Encounter (Signed)
Payment was collected 09/16/2020 for pt's "[employer]" form to be filled out.

## 2020-09-17 ENCOUNTER — Encounter (HOSPITAL_BASED_OUTPATIENT_CLINIC_OR_DEPARTMENT_OTHER): Payer: BC Managed Care – PPO | Admitting: Internal Medicine

## 2020-09-17 ENCOUNTER — Other Ambulatory Visit: Payer: Self-pay

## 2020-09-17 DIAGNOSIS — L89893 Pressure ulcer of other site, stage 3: Secondary | ICD-10-CM | POA: Diagnosis not present

## 2020-09-17 DIAGNOSIS — M6281 Muscle weakness (generalized): Secondary | ICD-10-CM | POA: Diagnosis not present

## 2020-09-17 DIAGNOSIS — L89619 Pressure ulcer of right heel, unspecified stage: Secondary | ICD-10-CM | POA: Diagnosis not present

## 2020-09-17 DIAGNOSIS — Z0289 Encounter for other administrative examinations: Secondary | ICD-10-CM

## 2020-09-17 DIAGNOSIS — I10 Essential (primary) hypertension: Secondary | ICD-10-CM | POA: Diagnosis not present

## 2020-09-17 DIAGNOSIS — Z8616 Personal history of COVID-19: Secondary | ICD-10-CM | POA: Diagnosis not present

## 2020-09-17 DIAGNOSIS — M86171 Other acute osteomyelitis, right ankle and foot: Secondary | ICD-10-CM | POA: Diagnosis not present

## 2020-09-17 DIAGNOSIS — L89629 Pressure ulcer of left heel, unspecified stage: Secondary | ICD-10-CM | POA: Diagnosis not present

## 2020-09-17 NOTE — Progress Notes (Signed)
[name], [name] ([id number]) Visit Report for 09/17/2020 Arrival Information Details Patient Name: Date of Service: [name], [place] 09/17/2020 10:30 A M Medical Record Number: [id number] Patient Account Number: [id number] Date of Birth/Sex: Treating RN: 13-Jul-1976 (45 y.o. [name] Primary Care Paisli Silfies: [name] Other Clinician: Referring Kerianna Rawlinson: Treating Stylianos Stradling/Extender: [name] in Treatment: 9 Visit Information History Since Last Visit Added or deleted any medications: No Patient Arrived: Wheel Chair Any new allergies or adverse reactions: No Arrival Time: 10:35 Had a fall or experienced change in No Accompanied By: spouse activities of daily living that may affect Transfer Assistance: None risk of falls: Patient Identification Verified: Yes Signs or symptoms of abuse/neglect since No Secondary Verification Process Completed: Yes last visito Patient Requires Transmission-Based Precautions: No Hospitalized since last visit: No Patient Has Alerts: No Implantable device outside of the clinic No excluding cellular tissue based products placed in the center since last visit: Has Dressing in Place as Prescribed: Yes Has Footwear/Offloading in Place as Yes Prescribed: Left: Surgical Shoe with Pressure Relief Insole Right: Surgical Shoe with Pressure Relief Insole Pain Present Now: Yes Electronic Signature(s) Signed: 09/17/2020 5:55:42 PM By: Baruch Gouty RN, BSN Entered By: Baruch Gouty on 09/17/2020 10:35:34 -------------------------------------------------------------------------------- Clinic Level of Care Assessment Details Patient Name: Date of Service: [name], [place] E. 09/17/2020 10:30 A M Medical Record Number: [id number] Patient Account Number: [id number] Date of Birth/Sex: Treating RN: 1975-11-26 (45 y.o. [name], [name] Primary Care Lorain Keast: [name] Other Clinician: Referring Omer Monter: Treating  Jasani Dolney/Extender: [name] in Treatment: 9 Clinic Level of Care Assessment Items TOOL 4 Quantity Score X- 1 0 Use when only an EandM is performed on FOLLOW-UP visit ASSESSMENTS - Nursing Assessment / Reassessment X- 1 10 Reassessment of Co-morbidities (includes updates in patient status) X- 1 5 Reassessment of Adherence to Treatment Plan ASSESSMENTS - Wound and Skin A ssessment / Reassessment []  - 0 Simple Wound Assessment / Reassessment - one wound X- 2 5 Complex Wound Assessment / Reassessment - multiple wounds X- 1 10 Dermatologic / Skin Assessment (not related to wound area) ASSESSMENTS - Focused Assessment X- 2 5 Circumferential Edema Measurements - multi extremities X- 1 10 Nutritional Assessment / Counseling / Intervention []  - 0 Lower Extremity Assessment (monofilament, tuning fork, pulses) []  - 0 Peripheral Arterial Disease Assessment (using hand held doppler) ASSESSMENTS - Ostomy and/or Continence Assessment and Care []  - 0 Incontinence Assessment and Management []  - 0 Ostomy Care Assessment and Management (repouching, etc.) PROCESS - Coordination of Care []  - 0 Simple Patient / Family Education for ongoing care X- 1 20 Complex (extensive) Patient / Family Education for ongoing care X- 1 10 Staff obtains Programmer, systems, Records, T Results / Process Orders est []  - 0 Staff telephones HHA, Nursing Homes / Clarify orders / etc []  - 0 Routine Transfer to another Facility (non-emergent condition) []  - 0 Routine Hospital Admission (non-emergent condition) []  - 0 New Admissions / Biomedical engineer / Ordering NPWT Apligraf, etc. , []  - 0 Emergency Hospital Admission (emergent condition) []  - 0 Simple Discharge Coordination X- 1 15 Complex (extensive) Discharge Coordination PROCESS - Special Needs []  - 0 Pediatric / Minor Patient Management []  - 0 Isolation Patient Management []  - 0 Hearing / Language / Visual special needs []   - 0 Assessment of Community assistance (transportation, D/C planning, etc.) []  - 0 Additional assistance / Altered mentation []  - 0 Support Surface(s) Assessment (bed, cushion, seat, etc.) INTERVENTIONS -  Wound Cleansing / Measurement []  - 0 Simple Wound Cleansing - one wound X- 2 5 Complex Wound Cleansing - multiple wounds X- 1 5 Wound Imaging (photographs - any number of wounds) []  - 0 Wound Tracing (instead of photographs) []  - 0 Simple Wound Measurement - one wound X- 2 5 Complex Wound Measurement - multiple wounds INTERVENTIONS - Wound Dressings []  - 0 Small Wound Dressing one or multiple wounds X- 2 15 Medium Wound Dressing one or multiple wounds []  - 0 Large Wound Dressing one or multiple wounds []  - 0 Application of Medications - topical []  - 0 Application of Medications - injection INTERVENTIONS - Miscellaneous []  - 0 External ear exam []  - 0 Specimen Collection (cultures, biopsies, blood, body fluids, etc.) []  - 0 Specimen(s) / Culture(s) sent or taken to Lab for analysis []  - 0 Patient Transfer (multiple staff / Civil Service fast streamer / Similar devices) []  - 0 Simple Staple / Suture removal (25 or less) []  - 0 Complex Staple / Suture removal (26 or more) []  - 0 Hypo / Hyperglycemic Management (close monitor of Blood Glucose) []  - 0 Ankle / Brachial Index (ABI) - do not check if billed separately X- 1 5 Vital Signs Has the patient been seen at the hospital within the last three years: Yes Total Score: 160 Level Of Care: New/Established - Level 5 Electronic Signature(s) Signed: 09/17/2020 6:08:01 PM By: [name] Entered By: [name] on 09/17/2020 17:23:39 -------------------------------------------------------------------------------- Encounter Discharge Information Details Patient Name: Date of Service: [name], [name] 09/17/2020 10:30 A M Medical Record Number: [id number] Patient Account Number: [id number] Date of Birth/Sex: Treating RN: [birth year]  (45 y.o. [name] Primary Care Mkayla Steele: [name] Other Clinician: Referring Kiaja Shorty: Treating Kaydence Menard/Extender: [name] in Treatment: 9 Encounter Discharge Information Items Discharge Condition: Stable Ambulatory Status: Wheelchair Discharge Destination: Home Transportation: Private Auto Accompanied By: wife Schedule Follow-up Appointment: Yes Clinical Summary of Care: Patient Declined Electronic Signature(s) Signed: 09/17/2020 5:58:12 PM By: Levan Hurst RN, BSN Entered By: Levan Hurst on 09/17/2020 17:31:02 -------------------------------------------------------------------------------- Lower Extremity Assessment Details Patient Name: Date of Service: [name], [place] 09/17/2020 10:30 A M Medical Record Number: [id number] Patient Account Number: [id number] Date of Birth/Sex: Treating RN: 12-27-1975 (45 y.o. [name] Primary Care Beverlyn Mcginness: [name] Other Clinician: Referring Akisha Sturgill: Treating Patrick Sohm/Extender: [name] in Treatment: 9 Edema Assessment Assessed: [name]: No] [name]: No] Edema: [Left: Yes] [Right: Yes] Calf Left: Right: Point of Measurement: 34 cm From Medial Instep 38 cm 40.5 cm Ankle Left: Right: Point of Measurement: 12 cm From Medial Instep 24 cm 25 cm Vascular Assessment Pulses: Dorsalis Pedis Palpable: [Left:Yes] [Right:Yes] Electronic Signature(s) Signed: 09/17/2020 5:55:42 PM By: Baruch Gouty RN, BSN Entered By: Baruch Gouty on 09/17/2020 10:45:13 -------------------------------------------------------------------------------- Multi Wound Chart Details Patient Name: Date of Service: [name], [place] 09/17/2020 10:30 A M Medical Record Number: [id number] Patient Account Number: [id number] Date of Birth/Sex: Treating RN: Apr 29, 1976 (45 y.o. [name] Primary Care Jaylon Grode: [name] Other Clinician: Referring Vernida Mcnicholas: Treating  Jamillia Closson/Extender: [name] in Treatment: 9 Vital Signs Height(in): 69 Pulse(bpm): 17 Weight(lbs): 280 Blood Pressure(mmHg): 130/83 Body Mass Index(BMI): 41 Temperature(F): 98.5 Respiratory Rate(breaths/min): 18 Photos: [1:No Photos Right Calcaneus] [2:No Photos Left Calcaneus] [N/A:N/A N/A] Wound Location: [1:Pressure Injury] [2:Pressure Injury] [N/A:N/A] Wounding Event: [1:Pressure Ulcer] [2:Pressure Ulcer] [N/A:N/A] Primary Etiology: [1:Asthma, Angina, Hypertension] [2:Asthma, Angina, Hypertension] [N/A:N/A] Comorbid History: [1:10/07/2019] [2:10/07/2019] [N/A:N/A] Date Acquired: [1:9] [2:9] [  N/A:N/A] Weeks of Treatment: [1:Open] [2:Open] [N/A:N/A] Wound Status: [1:5.5x2.6x0.6] [2:5.3x2.5x0.3] [N/A:N/A] Measurements L x W x D (cm) [1:11.231] [2:10.407] [N/A:N/A] A (cm) : rea [1:6.739] [2:3.122] [N/A:N/A] Volume (cm) : [1:64.50%] [2:61.60%] [N/A:N/A] % Reduction in A rea: [1:78.70%] [2:88.50%] [N/A:N/A] % Reduction in Volume: [1:Category/Stage III] [2:Category/Stage III] [N/A:N/A] Classification: [1:Medium] [2:Medium] [N/A:N/A] Exudate A mount: [1:Serosanguineous] [2:Serosanguineous] [N/A:N/A] Exudate Type: [1:red, brown] [2:red, brown] [N/A:N/A] Exudate Color: [1:Thickened] [2:Thickened] [N/A:N/A] Wound Margin: [1:Medium (34-66%)] [2:Medium (34-66%)] [N/A:N/A] Granulation A mount: [1:Red, Pink] [2:Red, Pink] [N/A:N/A] Granulation Quality: [1:Medium (34-66%)] [2:Medium (34-66%)] [N/A:N/A] Necrotic A mount: [1:Fat Layer (Subcutaneous Tissue): Yes Fat Layer (Subcutaneous Tissue): Yes N/A] Exposed Structures: [1:Fascia: No Tendon: No Muscle: No Joint: No Bone: No Small (1-33%)] [2:Fascia: No Tendon: No Muscle: No Joint: No Bone: No Small (1-33%)] [N/A:N/A] Treatment Notes Electronic Signature(s) Signed: 09/17/2020 5:13:40 PM By: Linton Ham MD Signed: 09/17/2020 6:08:01 PM By: [name] Entered By: Linton Ham on 09/17/2020  13:10:18 -------------------------------------------------------------------------------- Multi-Disciplinary Care Plan Details Patient Name: Date of Service: [name], [name] 09/17/2020 10:30 A M Medical Record Number: [id number] Patient Account Number: [id number] Date of Birth/Sex: Treating RN: [birth year] (45 y.o. [name] Primary Care Laurence Crofford: [name] Other Clinician: Referring Katherine Syme: Treating Drayson Dorko/Extender: [name] in Treatment: 9 Active Inactive Abuse / Safety / Falls / Self Care Management Nursing Diagnoses: Potential for falls Goals: Patient/caregiver will verbalize/demonstrate measures taken to prevent injury and/or falls Date Initiated: 07/15/2020 Target Resolution Date: 11/06/2020 Goal Status: Active Interventions: Assess fall risk on admission and as needed Assess impairment of mobility on admission and as needed per policy Notes: Wound/Skin Impairment Nursing Diagnoses: Impaired tissue integrity Knowledge deficit related to ulceration/compromised skin integrity Goals: Patient/caregiver will verbalize understanding of skin care regimen Date Initiated: 07/15/2020 Target Resolution Date: 12/04/2020 Goal Status: Active Ulcer/skin breakdown will have a volume reduction of 30% by week 4 Date Initiated: 07/15/2020 Date Inactivated: 08/20/2020 Target Resolution Date: 09/03/2020 Goal Status: Unmet Unmet Reason: no major changes. Interventions: Assess patient/caregiver ability to obtain necessary supplies Assess patient/caregiver ability to perform ulcer/skin care regimen upon admission and as needed Assess ulceration(s) every visit Provide education on ulcer and skin care Treatment Activities: Skin care regimen initiated : 07/15/2020 Topical wound management initiated : 07/15/2020 Notes: Electronic Signature(s) Signed: 09/17/2020 6:08:01 PM By: [name] Entered By: [name] on 09/17/2020  17:22:46 -------------------------------------------------------------------------------- Pain Assessment Details Patient Name: Date of Service: [name], [place] E. 09/17/2020 10:30 A M Medical Record Number: [id number] Patient Account Number: [id number] Date of Birth/Sex: Treating RN: 03/30/1976 (45 y.o. [name] Primary Care Mikea Quadros: [name] Other Clinician: Referring Mariane Burpee: Treating Greco Gastelum/Extender: [name] in Treatment: 9 Active Problems Location of Pain Severity and Description of Pain Patient Has Paino No Site Locations Duration of the Pain. Constant / Intermittento Intermittent Rate the pain. Current Pain Level: 0 Worst Pain Level: 3 Least Pain Level: 0 Character of Pain Describe the Pain: Tender Pain Management and Medication Current Pain Management: Rest: Yes Is the Current Pain Management Adequate: Adequate How does your wound impact your activities of daily livingo Sleep: No Bathing: No Appetite: No Relationship With Others: No Bladder Continence: No Emotions: No Bowel Continence: No Work: Yes Toileting: No Drive: Yes Dressing: No Hobbies: No Notes reports mild pain with walking Electronic Signature(s) Signed: 09/17/2020 5:55:42 PM By: Baruch Gouty RN, BSN Entered By: Baruch Gouty on 09/17/2020 10:45:04 -------------------------------------------------------------------------------- Patient/Caregiver Education Details Patient Name: Date of Service: [name], [name]  2/10/2022andnbsp10:30 A M Medical Record Number: [id number] Patient Account Number: [id number] Date of Birth/Gender: Treating RN: [birth year] (45 y.o. [name] Primary Care Physician: [name] Other Clinician: Referring Physician: Treating Physician/Extender: [name] in Treatment: 9 Education Assessment Education Provided To: Patient and Caregiver Education Topics Provided Wound/Skin  Impairment: Handouts: Skin Care Do's and Dont's Methods: Explain/Verbal Responses: Reinforcements needed Electronic Signature(s) Signed: 09/17/2020 6:08:01 PM By: [name] Entered By: [name] on 09/17/2020 17:23:06 -------------------------------------------------------------------------------- Wound Assessment Details Patient Name: Date of Service: [name], [place] 09/17/2020 10:30 A M Medical Record Number: [id number] Patient Account Number: [id number] Date of Birth/Sex: Treating RN: [birth year] (45 y.o. [name] Primary Care Ritik Stavola: [name] Other Clinician: Referring Eleanore Junio: Treating Nellie Pester/Extender: [name] in Treatment: 9 Wound Status Wound Number: 1 Primary Etiology: Pressure Ulcer Wound Location: Right Calcaneus Wound Status: Open Wounding Event: Pressure Injury Comorbid History: Asthma, Angina, Hypertension Date Acquired: 10/07/2019 Weeks Of Treatment: 9 Clustered Wound: No Wound Measurements Length: (cm) 5.5 Width: (cm) 2.6 Depth: (cm) 0.6 Area: (cm) 11.231 Volume: (cm) 6.739 % Reduction in Area: 64.5% % Reduction in Volume: 78.7% Epithelialization: Small (1-33%) Tunneling: No Undermining: No Wound Description Classification: Category/Stage III Wound Margin: Thickened Exudate Amount: Medium Exudate Type: Serosanguineous Exudate Color: red, brown Foul Odor After Cleansing: No Slough/Fibrino Yes Wound Bed Granulation Amount: Medium (34-66%) Exposed Structure Granulation Quality: Red, Pink Fascia Exposed: No Necrotic Amount: Medium (34-66%) Fat Layer (Subcutaneous Tissue) Exposed: Yes Necrotic Quality: Adherent Slough Tendon Exposed: No Muscle Exposed: No Joint Exposed: No Bone Exposed: No Treatment Notes Wound #1 (Calcaneus) Wound Laterality: Right Cleanser Soap and Water Discharge Instruction: May shower and wash wound with dial antibacterial soap and water prior to dressing  change. Peri-Wound Care Topical Primary Dressing Promogran Prisma Matrix, 4.34 (sq in) (silver collagen) Discharge Instruction: Moisten collagen with saline or hydrogel Secondary Dressing Woven Gauze Sponge, Non-Sterile 4x4 in Discharge Instruction: ensure to fold and apply up to the Collagen to ensure it keeps the Collagen in place next to wound bed. Apply over primary dressing as directed. ABD Pad, 5x9 Discharge Instruction: Apply over primary dressing as directed. Secured With Elastic Bandage 4 inch (ACE bandage) Discharge Instruction: Secure with ACE bandage as directed. Kerlix Roll Sterile, 4.5x3.1 (in/yd) Discharge Instruction: Secure with Kerlix as directed. Paper Tape, 2x10 (in/yd) Discharge Instruction: Secure dressing with tape as directed. Compression Wrap Compression Stockings Add-Ons Electronic Signature(s) Signed: 09/17/2020 5:55:42 PM By: Baruch Gouty RN, BSN Entered By: Baruch Gouty on 09/17/2020 10:51:59 -------------------------------------------------------------------------------- Wound Assessment Details Patient Name: Date of Service: [name], [place] 09/17/2020 10:30 A M Medical Record Number: [id number] Patient Account Number: [id number] Date of Birth/Sex: Treating RN: 05/05/1976 (45 y.o. [name] Primary Care Elloise Roark: [name] Other Clinician: Referring Nealie Mchatton: Treating Jereld Presti/Extender: [name] in Treatment: 9 Wound Status Wound Number: 2 Primary Etiology: Pressure Ulcer Wound Location: Left Calcaneus Wound Status: Open Wounding Event: Pressure Injury Comorbid History: Asthma, Angina, Hypertension Date Acquired: 10/07/2019 Weeks Of Treatment: 9 Clustered Wound: No Wound Measurements Length: (cm) 5.3 Width: (cm) 2.5 Depth: (cm) 0.3 Area: (cm) 10.407 Volume: (cm) 3.122 % Reduction in Area: 61.6% % Reduction in Volume: 88.5% Epithelialization: Small (1-33%) Tunneling: No Undermining:  No Wound Description Classification: Category/Stage III Wound Margin: Thickened Exudate Amount: Medium Exudate Type: Serosanguineous Exudate Color: red, brown Foul Odor After Cleansing: No Slough/Fibrino Yes Wound Bed Granulation Amount: Medium (34-66%) Exposed Structure Granulation Quality: Red, Pink Fascia Exposed:  No Necrotic Amount: Medium (34-66%) Fat Layer (Subcutaneous Tissue) Exposed: Yes Necrotic Quality: Adherent Slough Tendon Exposed: No Muscle Exposed: No Joint Exposed: No Bone Exposed: No Treatment Notes Wound #2 (Calcaneus) Wound Laterality: Left Cleanser Soap and Water Discharge Instruction: May shower and wash wound with dial antibacterial soap and water prior to dressing change. Peri-Wound Care Topical Primary Dressing Promogran Prisma Matrix, 4.34 (sq in) (silver collagen) Discharge Instruction: Moisten collagen with saline or hydrogel Secondary Dressing Woven Gauze Sponge, Non-Sterile 4x4 in Discharge Instruction: ensure to fold and apply up to the Collagen to ensure it keeps the Collagen in place next to wound bed. Apply over primary dressing as directed. ABD Pad, 5x9 Discharge Instruction: Apply over primary dressing as directed. Secured With Elastic Bandage 4 inch (ACE bandage) Discharge Instruction: Secure with ACE bandage as directed. Kerlix Roll Sterile, 4.5x3.1 (in/yd) Discharge Instruction: Secure with Kerlix as directed. Paper Tape, 2x10 (in/yd) Discharge Instruction: Secure dressing with tape as directed. Compression Wrap Compression Stockings Add-Ons Electronic Signature(s) Signed: 09/17/2020 5:55:42 PM By: Baruch Gouty RN, BSN Entered By: Baruch Gouty on 09/17/2020 10:53:11 -------------------------------------------------------------------------------- Vitals Details Patient Name: Date of Service: [name], [place] 09/17/2020 10:30 A M Medical Record Number: [id number] Patient Account Number: [id number] Date of  Birth/Sex: Treating RN: 07-17-1976 (45 y.o. [name] Primary Care Brenden Rudman: [name] Other Clinician: Referring Sulamita Lafountain: Treating Lonald Troiani/Extender: [name] in Treatment: 9 Vital Signs Time Taken: 10:35 Temperature (F): 98.5 Height (in): 69 Pulse (bpm): 86 Source: Stated Respiratory Rate (breaths/min): 18 Weight (lbs): 280 Blood Pressure (mmHg): 130/83 Source: Stated Reference Range: 80 - 120 mg / dl Body Mass Index (BMI): 41.3 Electronic Signature(s) Signed: 09/17/2020 5:55:42 PM By: Baruch Gouty RN, BSN Entered By: Baruch Gouty on 09/17/2020 10:44:09

## 2020-09-18 NOTE — Progress Notes (Signed)
KURT, HOFFMEIER ([id number]) Visit Report for 09/17/2020 HPI Details Patient Name: Date of Service: [name], [place] 09/17/2020 10:30 A M Medical Record Number: [id number] Patient Account Number: [id number] Date of Birth/Sex: Treating RN: 1976-02-08 (45 y.o. [name] Primary Care Provider: [name] Other Clinician: Referring Provider: Treating Provider/Extender: [name] in Treatment: 9 History of Present Illness HPI Description: 07/15/2020 upon evaluation today patient presents for initial inspection here in our clinic concerning issues he has been having with the bottoms of his feet bilaterally. He states these actually occurred as wounds when he was hospitalized for 5 months secondary to Covid. He was apparently with tilting bed where he was in an upright position quite frequently and apparently this occurred in some way shape or form during that time. Fortunately there is no sign of active infection at this time. No fevers, chills, nausea, vomiting, or diarrhea. With that being said he still has substantial wounds on the plantar aspects of his feet Theragen require quite a bit of work to get these to heal. He has been using Santyl currently though that is been problematic both in receiving the medication as well as actually paid for it as it is become quite expensive. Prior to the experience with Covid the patient really did not have any major medical problems other than hypertension he does have some mild generalized weakness following the Covid experience. 07/22/2020 on evaluation today patient appears to be doing okay in regard to his foot ulcers I feel like the wound beds are showing signs of better improvement that I do believe the Iodoflex is helping in this regard. With that being said he does have a lot of drainage currently and this is somewhat blue/green in nature which is consistent with Pseudomonas. I do think a culture today would be  appropriate for [place] to evaluate and see if that is indeed the case I would likely start him on antibiotic orally as well he is not allergic to Cipro knows of no issues he has had in the past 12/21; patient was admitted to the clinic earlier this month with bilateral presumed pressure ulcers on the bottom of his feet apparently related to excessive pressure from a tilt table arrangement in the intensive care unit. Patient relates this to being on ECMO but I am not really sure that is exactly related to that. I must say I have never seen anything like this. He has fairly extensive full-thickness wounds extending from his heel towards his midfoot mostly centered laterally. There is already been some healing distally. He does not appear to have an arterial issue. He has been using gentamicin to the wound surfaces with Iodoflex to help with ongoing debridement 1/6; this is a patient with pressure ulcers on the bottom of his feet related to excessive pressure from a standing position in the intensive care unit. He is complaining of a lot of pain in the right heel. He is not a diabetic. 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 We have been using Iodoflex to help prepare the surfaces of both wounds for an advanced treatment product. He is nonambulatory spending most of his time in a wheelchair I have asked him not to propel the wheelchair with his heels 1/13; in general his wounds look better not much surface area change we have been using Iodoflex as of last week. I did an x-ray of the right heel as the patient was complaining of pain. I  had some thoughts about a stress fracture perhaps Achilles tendon problems however what it showed was erosive changes along the inferior aspect of the calcaneus he now has a MRI booked for 1/20. 1/20; in general his wounds continue to be better. Some improvement in the large narrow areas proximally in his foot. He is still complaining of pain in the  right heel and tenderness in certain areas of this wound. His MRI is tonight. I am not just looking for osteomyelitis that was brought up on the x-ray I am wondering about stress fractures, tendon ruptures etc. He has no such findings on the left. Also noteworthy is that the patient had critical illness neuropathy and some of the discomfort may be actual improvement in nerve function I am just not sure. These wounds were initially in the setting of severe critical illness related to COVID-19. He was put in a standing position. He may have also been on pressors at the point contributing to tissue ischemia. By his description at some point these wounds were grossly necrotic extending proximally up into the Achilles part of his heel. I do not know that I have ever really seen pictures of them like this although they may exist in epic We have ordered Tri layer Oasis. I am trying to stimulate some granulation in these areas. This is of course assuming the MRI is negative for infection 1/27; since the patient was last here he saw Dr. Juleen China of infectious disease. He is planned for vancomycin and ceftriaxone. Prior operative culture grew MSSA. Also ordered baseline lab work. He also ordered arterial studies although the ABIs in our clinic were normal as well as his clinical exam these were normal I do not think he needs to see vascular surgery. His ABIs at the PTA were 1.22 in the right triphasic waveforms with a normal TBI of 1.15 on the left ABI of 1.22 with triphasic waveforms and a normal TBI of 1.08. Finally he saw Dr. Amalia Hailey who will follow him in for 2 months. At this point I do not think he felt that he needed a procedure on the right calcaneal bone. Dr. Juleen China is elected for broad-spectrum antibiotic The patient is still having pain in the right heel. He walks with a walker 2/3; wounds are generally smaller. He is tolerating his IV antibiotics. I believe this is vancomycin and ceftriaxone. We are  still waiting for Oasis burn in terms of his out-of-pocket max which he should be meeting soon given the IV antibiotics, MRIs etc. I have asked him to check in on this. We are using silver collagen in the meantime the wounds look better 2/10; tolerating IV vancomycin and Rocephin. We are waiting to apply for Oasis. Although I am not really sure where he is in his out-of-pocket max. Electronic Signature(s) Signed: 09/17/2020 5:13:40 PM By: Linton Ham MD Entered By: Linton Ham on 09/17/2020 13:11:04 -------------------------------------------------------------------------------- Physical Exam Details Patient Name: Date of Service: [name], [place] 09/17/2020 10:30 A M Medical Record Number: [id number] Patient Account Number: [id number] Date of Birth/Sex: Treating RN: 01/21/1976 (45 y.o. [name] Primary Care Provider: [name] Other Clinician: Referring Provider: Treating Provider/Extender: [name] in Treatment: 9 Constitutional Sitting or standing Blood Pressure is within target range for patient.. Pulse regular and within target range for patient.Marland Kitchen Respirations regular, non-labored and within target range.. Temperature is normal and within the target range for the patient.Marland Kitchen Appears in no distress. Cardiovascular Pedal pulses are palpable. Notes  Exam; things seem to be filling in. The areas on the bilateral plantar heels. Somewhat irregular areas and undermining. I think the best treatment here would be the Oasis burn if we can get it covered through his insurance. There is no current evidence of active infection. He is on IV antibiotics for the osteomyelitis in the right heel much of these wounds bilaterally have already filled in Electronic Signature(s) Signed: 09/17/2020 5:13:40 PM By: Linton Ham MD Entered By: Linton Ham on 09/17/2020  13:12:26 -------------------------------------------------------------------------------- Physician Orders Details Patient Name: Date of Service: [name], [facility]. 09/17/2020 10:30 A M Medical Record Number: [id number] Patient Account Number: [id number] Date of Birth/Sex: Treating RN: [birth year] (45 y.o. [name] Primary Care Provider: [name] Other Clinician: Referring Provider: Treating Provider/Extender: [name] in Treatment: 9 Verbal / Phone Orders: No Diagnosis Coding Follow-up Appointments Return Appointment in 1 week. Cellular or Tissue Based Products Wound #1 Right Calcaneus Cellular or Tissue Based Product Type: - wound center to order [employer]. Wound #2 Left Calcaneus Cellular or Tissue Based Product Type: - wound center to order [employer]. Bathing/ Shower/ Hygiene May shower and wash wound with soap and water. - wash wounds with dial antibacterial soap Edema Control - Lymphedema / SCD / Other Bilateral Lower Extremities Elevate legs to the level of the heart or above for 30 minutes daily and/or when sitting, a frequency of: Avoid standing for long periods of time. Off-Loading Other: - keep pressure off of the bottom of your feet Wound Treatment Wound #1 - Calcaneus Wound Laterality: Right Cleanser: Soap and Water 3 x Per Week/30 Days Discharge Instructions: May shower and wash wound with dial antibacterial soap and water prior to dressing change. Prim Dressing: Promogran Prisma Matrix, 4.34 (sq in) (silver collagen) (Generic) 3 x Per Week/30 Days ary Discharge Instructions: Moisten collagen with saline or hydrogel Secondary Dressing: Woven Gauze Sponge, Non-Sterile 4x4 in (Generic) 3 x Per Week/30 Days Discharge Instructions: ensure to fold and apply up to the Collagen to ensure it keeps the Collagen in place next to wound bed. Apply over primary dressing as directed. Secondary Dressing: ABD Pad, 5x9 3 x Per Week/30  Days Discharge Instructions: Apply over primary dressing as directed. Secured With: Elastic Bandage 4 inch (ACE bandage) 3 x Per Week/30 Days Discharge Instructions: Secure with ACE bandage as directed. Secured With: [employer], 4.5x3.1 (in/yd) (Generic) 3 x Per Week/30 Days Discharge Instructions: Secure with Kerlix as directed. Secured With: Paper Tape, 2x10 (in/yd) (Generic) 3 x Per Week/30 Days Discharge Instructions: Secure dressing with tape as directed. Wound #2 - Calcaneus Wound Laterality: Left Cleanser: Soap and Water 3 x Per Week/30 Days Discharge Instructions: May shower and wash wound with dial antibacterial soap and water prior to dressing change. Prim Dressing: Promogran Prisma Matrix, 4.34 (sq in) (silver collagen) (Generic) 3 x Per Week/30 Days ary Discharge Instructions: Moisten collagen with saline or hydrogel Secondary Dressing: Woven Gauze Sponge, Non-Sterile 4x4 in (Generic) 3 x Per Week/30 Days Discharge Instructions: ensure to fold and apply up to the Collagen to ensure it keeps the Collagen in place next to wound bed. Apply over primary dressing as directed. Secondary Dressing: ABD Pad, 5x9 3 x Per Week/30 Days Discharge Instructions: Apply over primary dressing as directed. Secured With: Elastic Bandage 4 inch (ACE bandage) 3 x Per Week/30 Days Discharge Instructions: Secure with ACE bandage as directed. Secured With: [employer], 4.5x3.1 (in/yd) (Generic) 3 x Per Week/30 Days  Discharge Instructions: Secure with Kerlix as directed. Secured With: Paper Tape, 2x10 (in/yd) (Generic) 3 x Per Week/30 Days Discharge Instructions: Secure dressing with tape as directed. Electronic Signature(s) Signed: 09/17/2020 5:13:40 PM By: Linton Ham MD Signed: 09/17/2020 6:08:01 PM By: [name] Entered By: [name] on 09/17/2020 11:31:45 -------------------------------------------------------------------------------- Problem List Details Patient  Name: Date of Service: [name], [place] 09/17/2020 10:30 A M Medical Record Number: [id number] Patient Account Number: [id number] Date of Birth/Sex: Treating RN: Jul 07, 1976 (45 y.o. [name], [name] Primary Care Provider: [name] Other Clinician: Referring Provider: Treating Provider/Extender: [name] in Treatment: 9 Active Problems ICD-10 Encounter Code Description Active Date MDM Diagnosis [phone] Pressure ulcer of other site, stage 3 07/15/2020 No Yes M62.81 Muscle weakness (generalized) 07/15/2020 No Yes I10 Essential (primary) hypertension 07/15/2020 No Yes L97.512 Non-pressure chronic ulcer of other part of right foot with fat layer exposed 09/03/2020 No Yes L97.522 Non-pressure chronic ulcer of other 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 09/03/2020 No Yes M86.171 Other acute osteomyelitis, right ankle and foot 09/03/2020 No Yes Inactive Problems Resolved Problems Electronic Signature(s) Signed: 09/17/2020 5:13:40 PM By: Linton Ham MD Entered By: Linton Ham on 09/17/2020 13:10:12 -------------------------------------------------------------------------------- Progress Note Details Patient Name: Date of Service: [name], [place] 09/17/2020 10:30 A M Medical Record Number: [id number] Patient Account Number: [id number] Date of Birth/Sex: Treating RN: 1976-03-31 (45 y.o. [name] Primary Care Provider: [name] Other Clinician: Referring Provider: Treating Provider/Extender: [name] in Treatment: 9 Subjective History of Present Illness (HPI) 07/15/2020 upon evaluation today patient presents for initial inspection here in our clinic concerning issues he has been having with the bottoms of his feet bilaterally. He states these actually occurred as wounds when he was hospitalized for 5 months secondary to Covid. He was apparently with tilting bed where he was in an upright position quite frequently and  apparently this occurred in some way shape or form during that time. Fortunately there is no sign of active infection at this time. No fevers, chills, nausea, vomiting, or diarrhea. With that being said he still has substantial wounds on the plantar aspects of his feet Theragen require quite a bit of work to get these to heal. He has been using Santyl currently though that is been problematic both in receiving the medication as well as actually paid for it as it is become quite expensive. Prior to the experience with Covid the patient really did not have any major medical problems other than hypertension he does have some mild generalized weakness following the Covid experience. 07/22/2020 on evaluation today patient appears to be doing okay in regard to his foot ulcers I feel like the wound beds are showing signs of better improvement that I do believe the Iodoflex is helping in this regard. With that being said he does have a lot of drainage currently and this is somewhat blue/green in nature which is consistent with Pseudomonas. I do think a culture today would be appropriate for [place] to evaluate and see if that is indeed the case I would likely start him on antibiotic orally as well he is not allergic to Cipro knows of no issues he has had in the past 12/21; patient was admitted to the clinic earlier this month with bilateral presumed pressure ulcers on the bottom of his feet apparently related to excessive pressure from a tilt table arrangement in the intensive care unit. Patient relates this to being on  ECMO but I am not really sure that is exactly related to that. I must say I have never seen anything like this. He has fairly extensive full-thickness wounds extending from his heel towards his midfoot mostly centered laterally. There is already been some healing distally. He does not appear to have an arterial issue. He has been using gentamicin to the wound surfaces with Iodoflex to help with  ongoing debridement 1/6; this is a patient with pressure ulcers on the bottom of his feet related to excessive pressure from a standing position in the intensive care unit. He is complaining of a lot of pain in the right heel. He is not a diabetic. 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 We have been using Iodoflex to help prepare the surfaces of both wounds for an advanced treatment product. He is nonambulatory spending most of his time in a wheelchair I have asked him not to propel the wheelchair with his heels 1/13; in general his wounds look better not much surface area change we have been using Iodoflex as of last week. I did an x-ray of the right heel as the patient was complaining of pain. I had some thoughts about a stress fracture perhaps Achilles tendon problems however what it showed was erosive changes along the inferior aspect of the calcaneus he now has a MRI booked for 1/20. 1/20; in general his wounds continue to be better. Some improvement in the large narrow areas proximally in his foot. He is still complaining of pain in the right heel and tenderness in certain areas of this wound. His MRI is tonight. I am not just looking for osteomyelitis that was brought up on the x-ray I am wondering about stress fractures, tendon ruptures etc. He has no such findings on the left. Also noteworthy is that the patient had critical illness neuropathy and some of the discomfort may be actual improvement in nerve function I am just not sure. These wounds were initially in the setting of severe critical illness related to COVID-19. He was put in a standing position. He may have also been on pressors at the point contributing to tissue ischemia. By his description at some point these wounds were grossly necrotic extending proximally up into the Achilles part of his heel. I do not know that I have ever really seen pictures of them like this although they may exist in epic We  have ordered Tri layer Oasis. I am trying to stimulate some granulation in these areas. This is of course assuming the MRI is negative for infection 1/27; since the patient was last here he saw Dr. Juleen China of infectious disease. He is planned for vancomycin and ceftriaxone. Prior operative culture grew MSSA. Also ordered baseline lab work. He also ordered arterial studies although the ABIs in our clinic were normal as well as his clinical exam these were normal I do not think he needs to see vascular surgery. His ABIs at the PTA were 1.22 in the right triphasic waveforms with a normal TBI of 1.15 on the left ABI of 1.22 with triphasic waveforms and a normal TBI of 1.08. Finally he saw Dr. Amalia Hailey who will follow him in for 2 months. At this point I do not think he felt that he needed a procedure on the right calcaneal bone. Dr. Juleen China is elected for broad-spectrum antibiotic The patient is still having pain in the right heel. He walks with a walker 2/3; wounds are generally smaller. He is tolerating  his IV antibiotics. I believe this is vancomycin and ceftriaxone. We are still waiting for Oasis burn in terms of his out-of-pocket max which he should be meeting soon given the IV antibiotics, MRIs etc. I have asked him to check in on this. We are using silver collagen in the meantime the wounds look better 2/10; tolerating IV vancomycin and Rocephin. We are waiting to apply for Oasis. Although I am not really sure where he is in his out-of-pocket max. Objective Constitutional Sitting or standing Blood Pressure is within target range for patient.. Pulse regular and within target range for patient.Marland Kitchen Respirations regular, non-labored and within target range.. Temperature is normal and within the target range for the patient.Marland Kitchen Appears in no distress. Vitals Time Taken: 10:35 AM, Height: 69 in, Source: Stated, Weight: 280 lbs, Source: Stated, BMI: 41.3, Temperature: 98.5 F, Pulse: 86 bpm,  Respiratory Rate: 18 breaths/min, Blood Pressure: 130/83 mmHg. Cardiovascular Pedal pulses are palpable. General Notes: Exam; things seem to be filling in. The areas on the bilateral plantar heels. Somewhat irregular areas and undermining. I think the best treatment here would be the Oasis burn if we can get it covered through his insurance. There is no current evidence of active infection. He is on IV antibiotics for the osteomyelitis in the right heel much of these wounds bilaterally have already filled in Integumentary (Hair, Skin) Wound #1 status is Open. Original cause of wound was Pressure Injury. The wound is located on the Right Calcaneus. The wound measures 5.5cm length x 2.6cm width x 0.6cm depth; 11.231cm^2 area and 6.739cm^3 volume. There is Fat Layer (Subcutaneous Tissue) exposed. There is no tunneling or undermining noted. There is a medium amount of serosanguineous drainage noted. The wound margin is thickened. There is medium (34-66%) red, pink granulation within the wound bed. There is a medium (34-66%) amount of necrotic tissue within the wound bed including Adherent Slough. Wound #2 status is Open. Original cause of wound was Pressure Injury. The wound is located on the Left Calcaneus. The wound measures 5.3cm length x 2.5cm width x 0.3cm depth; 10.407cm^2 area and 3.122cm^3 volume. There is Fat Layer (Subcutaneous Tissue) exposed. There is no tunneling or undermining noted. There is a medium amount of serosanguineous drainage noted. The wound margin is thickened. There is medium (34-66%) red, pink granulation within the wound bed. There is a medium (34-66%) amount of necrotic tissue within the wound bed including Adherent Slough. Assessment Active Problems ICD-10 Pressure ulcer of other site, stage 3 Muscle weakness (generalized) Essential (primary) hypertension Non-pressure chronic ulcer of other part of right foot with fat layer exposed Non-pressure chronic ulcer of other  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 Other acute osteomyelitis, right ankle and foot Plan Follow-up Appointments: Return Appointment in 1 week. Cellular or Tissue Based Products: Wound #1 Right Calcaneus: Cellular or Tissue Based Product Type: - wound center to order [employer]. Wound #2 Left Calcaneus: Cellular or Tissue Based Product Type: - wound center to order [employer]. Bathing/ Shower/ Hygiene: May shower and wash wound with soap and water. - wash wounds with dial antibacterial soap Edema Control - Lymphedema / SCD / Other: Elevate legs to the level of the heart or above for 30 minutes daily and/or when sitting, a frequency of: Avoid standing for long periods of time. Off-Loading: Other: - keep pressure off of the bottom of your feet WOUND #1: - Calcaneus Wound Laterality: Right Cleanser: Soap and Water 3 x Per Week/30 Days Discharge Instructions: May  shower and wash wound with dial antibacterial soap and water prior to dressing change. Prim Dressing: Promogran Prisma Matrix, 4.34 (sq in) (silver collagen) (Generic) 3 x Per Week/30 Days ary Discharge Instructions: Moisten collagen with saline or hydrogel Secondary Dressing: Woven Gauze Sponge, Non-Sterile 4x4 in (Generic) 3 x Per Week/30 Days Discharge Instructions: ensure to fold and apply up to the Collagen to ensure it keeps the Collagen in place next to wound bed. Apply over primary dressing as directed. Secondary Dressing: ABD Pad, 5x9 3 x Per Week/30 Days Discharge Instructions: Apply over primary dressing as directed. Secured With: Elastic Bandage 4 inch (ACE bandage) 3 x Per Week/30 Days Discharge Instructions: Secure with ACE bandage as directed. Secured With: [employer], 4.5x3.1 (in/yd) (Generic) 3 x Per Week/30 Days Discharge Instructions: Secure with Kerlix as directed. Secured With: Paper T ape, 2x10 (in/yd) (Generic) 3 x Per Week/30 Days Discharge Instructions: Secure dressing with tape as  directed. WOUND #2: - Calcaneus Wound Laterality: Left Cleanser: Soap and Water 3 x Per Week/30 Days Discharge Instructions: May shower and wash wound with dial antibacterial soap and water prior to dressing change. Prim Dressing: Promogran Prisma Matrix, 4.34 (sq in) (silver collagen) (Generic) 3 x Per Week/30 Days ary Discharge Instructions: Moisten collagen with saline or hydrogel Secondary Dressing: Woven Gauze Sponge, Non-Sterile 4x4 in (Generic) 3 x Per Week/30 Days Discharge Instructions: ensure to fold and apply up to the Collagen to ensure it keeps the Collagen in place next to wound bed. Apply over primary dressing as directed. Secondary Dressing: ABD Pad, 5x9 3 x Per Week/30 Days Discharge Instructions: Apply over primary dressing as directed. Secured With: Elastic Bandage 4 inch (ACE bandage) 3 x Per Week/30 Days Discharge Instructions: Secure with ACE bandage as directed. Secured With: [employer], 4.5x3.1 (in/yd) (Generic) 3 x Per Week/30 Days Discharge Instructions: Secure with Kerlix as directed. Secured With: Paper T ape, 2x10 (in/yd) (Generic) 3 x Per Week/30 Days Discharge Instructions: Secure dressing with tape as directed. 1. For this week we continued with the Promogran 2. We will probably order the Oasis burn when I am certain that his insurance will cover it. 3. We have made considerable progress although the wound areas on the heels are in difficult positions. Electronic Signature(s) Signed: 09/17/2020 5:13:40 PM By: Linton Ham MD Entered By: Linton Ham on 09/17/2020 13:13:07 -------------------------------------------------------------------------------- SuperBill Details Patient Name: Date of Service: [name], [place]. 09/17/2020 Medical Record Number: [id number] Patient Account Number: [id number] Date of Birth/Sex: Treating RN: [birth year] (45 y.o. [name], [name] Primary Care Provider: [name] Other Clinician: Referring  Provider: Treating Provider/Extender: [name] in Treatment: 9 Diagnosis Coding ICD-10 Codes Code Description [phone] Pressure ulcer of other site, stage 3 M62.81 Muscle weakness (generalized) I10 Essential (primary) hypertension L97.512 Non-pressure chronic ulcer of other part of right foot with fat layer exposed L97.522 Non-pressure chronic ulcer of other 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 M86.171 Other acute osteomyelitis, right ankle and foot Facility Procedures CPT4 Code: [id number] Description: 26712 - WOUND CARE VISIT-LEV 5 EST PT Modifier: Quantity: 1 Physician Procedures : CPT4 Code Description Modifier [id number] - WC PHYS LEVEL 3 - EST PT ICD-10 Diagnosis Description L89.893 Pressure ulcer of other site, stage 3 L97.512 Non-pressure chronic ulcer of other part of right foot with fat layer exposed I10 Essential  (primary) hypertension M86.171 Other acute osteomyelitis, right ankle and foot Quantity: 1 Electronic Signature(s) Signed: 09/17/2020 6:08:01 PM By: [name] Signed:  09/18/2020 1:59:22 PM By: Linton Ham MD Previous Signature: 09/17/2020 5:13:40 PM Version By: Linton Ham MD Entered By: [name] on 09/17/2020 17:23:46

## 2020-09-20 DIAGNOSIS — M86171 Other acute osteomyelitis, right ankle and foot: Secondary | ICD-10-CM | POA: Diagnosis not present

## 2020-09-20 DIAGNOSIS — L03115 Cellulitis of right lower limb: Secondary | ICD-10-CM | POA: Diagnosis not present

## 2020-09-23 DIAGNOSIS — M86171 Other acute osteomyelitis, right ankle and foot: Secondary | ICD-10-CM | POA: Diagnosis not present

## 2020-09-23 DIAGNOSIS — L03115 Cellulitis of right lower limb: Secondary | ICD-10-CM | POA: Diagnosis not present

## 2020-09-24 ENCOUNTER — Other Ambulatory Visit: Payer: Self-pay

## 2020-09-24 ENCOUNTER — Encounter (HOSPITAL_BASED_OUTPATIENT_CLINIC_OR_DEPARTMENT_OTHER): Payer: BC Managed Care – PPO | Admitting: Internal Medicine

## 2020-09-24 DIAGNOSIS — M86171 Other acute osteomyelitis, right ankle and foot: Secondary | ICD-10-CM | POA: Diagnosis not present

## 2020-09-24 DIAGNOSIS — L89619 Pressure ulcer of right heel, unspecified stage: Secondary | ICD-10-CM | POA: Diagnosis not present

## 2020-09-24 DIAGNOSIS — I10 Essential (primary) hypertension: Secondary | ICD-10-CM | POA: Diagnosis not present

## 2020-09-24 DIAGNOSIS — L89893 Pressure ulcer of other site, stage 3: Secondary | ICD-10-CM | POA: Diagnosis not present

## 2020-09-24 DIAGNOSIS — Z8616 Personal history of COVID-19: Secondary | ICD-10-CM | POA: Diagnosis not present

## 2020-09-24 DIAGNOSIS — L89623 Pressure ulcer of left heel, stage 3: Secondary | ICD-10-CM | POA: Diagnosis not present

## 2020-09-24 DIAGNOSIS — M6281 Muscle weakness (generalized): Secondary | ICD-10-CM | POA: Diagnosis not present

## 2020-09-24 NOTE — Telephone Encounter (Signed)
Paperwork to [employer].  OK transmission received

## 2020-09-24 NOTE — Progress Notes (Signed)
SHAWNTA, ZIMBELMAN ([id number]) Visit Report for 09/24/2020 Cellular or Tissue Based Product Details Patient Name: Date of Service: [name], [place]. 09/24/2020 10:00 A M Medical Record Number: [id number] Patient Account Number: [id number] Date of Birth/Sex: Treating RN: 1976/04/14 (45 y.o. [name] Primary Care Provider: [name] Other Clinician: Referring Provider: Treating Provider/Extender: [name] in Treatment: 10 Cellular or Tissue Based Product Type Wound #1 Right Calcaneus Applied to: Performed By: Physician Ricard Dillon., MD Cellular or Tissue Based Product Type: Oasis tri-layer Level of Consciousness (Pre-procedure): Awake and Alert Pre-procedure Verification/Time Out Yes - 10:25 Taken: Location: genitalia / hands / feet / multiple digits Wound Size (sq cm): 8.8 Product Size (sq cm): 17.5 Waste Size (sq cm): 0 Waste Reason: wound size Amount of Product Applied (sq cm): 17.5 Instrument Used: Forceps, Scissors Lot #: [id number] Order #: 1 Expiration Date: 11/16/2021 Fenestrated: No Reconstituted: Yes Solution Type: normal saline Solution Amount: 10 mL Lot #: [id number] Solution Expiration Date: 02/04/2022 Secured: Yes Secured With: Steri-Strips, drawtex and adaptic Dressing Applied: No Procedural Pain: 0 Post Procedural Pain: 0 Response to Treatment: Procedure was tolerated well Level of Consciousness (Post- Awake and Alert procedure): Post Procedure Diagnosis Same as Pre-procedure Electronic Signature(s) Signed: 09/24/2020 5:29:55 PM By: Linton Ham MD Signed: 09/24/2020 5:51:10 PM By: [name] Entered By: [name] on 09/24/2020 11:05:29 -------------------------------------------------------------------------------- Cellular or Tissue Based Product Details Patient Name: Date of Service: [name], [place]. 09/24/2020 10:00 A M Medical Record Number: [id number] Patient Account Number: [id number] Date of  Birth/Sex: Treating RN: 09/12/1975 (45 y.o. [name] Primary Care Provider: [name] Other Clinician: Referring Provider: Treating Provider/Extender: [name] in Treatment: 10 Cellular or Tissue Based Product Type Wound #2 Left Calcaneus Applied to: Performed By: Physician Ricard Dillon., MD Cellular or Tissue Based Product Type: Oasis tri-layer Level of Consciousness (Pre-procedure): Awake and Alert Pre-procedure Verification/Time Out Yes - 10:25 Taken: Location: genitalia / hands / feet / multiple digits Wound Size (sq cm): 13.44 Product Size (sq cm): 17.5 Waste Size (sq cm): 0 Waste Reason: wound size Amount of Product Applied (sq cm): 17.5 Instrument Used: Forceps, Scissors Lot #: [id number] Order #: 1 Expiration Date: 11/16/2021 Fenestrated: No Reconstituted: Yes Solution Type: normal saline Solution Amount: 10 mL Lot #: [id number] Solution Expiration Date: 02/04/2022 Secured: Yes Secured With: Steri-Strips, drawtex and adaptic Dressing Applied: No Procedural Pain: 0 Post Procedural Pain: 0 Response to Treatment: Procedure was tolerated well Level of Consciousness (Post- Awake and Alert procedure): Post Procedure Diagnosis Same as Pre-procedure Electronic Signature(s) Signed: 09/24/2020 5:29:55 PM By: Linton Ham MD Signed: 09/24/2020 5:51:10 PM By: [name] Entered By: [name] on 09/24/2020 11:05:42 -------------------------------------------------------------------------------- Debridement Details Patient Name: Date of Service: [name], [place] 09/24/2020 10:00 A M Medical Record Number: [id number] Patient Account Number: [id number] Date of Birth/Sex: Treating RN: 05/01/1976 (45 y.o. [name] Primary Care Provider: [name] Other Clinician: Referring Provider: Treating Provider/Extender: [name] in Treatment: 10 Debridement Performed for Assessment: Wound #2 Left  Calcaneus Performed By: Physician Ricard Dillon., MD Debridement Type: Debridement Level of Consciousness (Pre-procedure): Awake and Alert Pre-procedure Verification/Time Out Yes - 10:20 Taken: Start Time: 10:21 Pain Control: Lidocaine 4% T opical Solution T Area Debrided (L x W): otal 5.6 (cm) x 2.4 (cm) = 13.44 (cm) Tissue and other material debrided: Non-Viable, Slough, Slough Level: Non-Viable Tissue Debridement Description: Selective/Open Wound Instrument: Curette Bleeding: Moderate  Hemostasis Achieved: Pressure End Time: 10:24 Procedural Pain: 0 Post Procedural Pain: 0 Response to Treatment: Procedure was tolerated well Level of Consciousness (Post- Level of Consciousness (Post- Awake and Alert procedure): Post Debridement Measurements of Total Wound Length: (cm) 5.6 Stage: Category/Stage III Width: (cm) 2.4 Depth: (cm) 0.5 Volume: (cm) 5.278 Character of Wound/Ulcer Post Debridement: Improved Post Procedure Diagnosis Same as Pre-procedure Electronic Signature(s) Signed: 09/24/2020 5:29:55 PM By: Linton Ham MD Signed: 09/24/2020 5:51:10 PM By: [name] Entered By: Linton Ham on 09/24/2020 10:42:53 -------------------------------------------------------------------------------- HPI Details Patient Name: Date of Service: [name], [place] 09/24/2020 10:00 A M Medical Record Number: [id number] Patient Account Number: [id number] Date of Birth/Sex: Treating RN: Jul 05, 1976 (45 y.o. [name] Primary Care Provider: [name] Other Clinician: Referring Provider: Treating Provider/Extender: [name] in Treatment: 10 History of Present Illness HPI Description: 07/15/2020 upon evaluation today patient presents for initial inspection here in our clinic concerning issues he has been having with the bottoms of his feet bilaterally. He states these actually occurred as wounds when he was hospitalized for 5 months secondary to  Covid. He was apparently with tilting bed where he was in an upright position quite frequently and apparently this occurred in some way shape or form during that time. Fortunately there is no sign of active infection at this time. No fevers, chills, nausea, vomiting, or diarrhea. With that being said he still has substantial wounds on the plantar aspects of his feet Theragen require quite a bit of work to get these to heal. He has been using Santyl currently though that is been problematic both in receiving the medication as well as actually paid for it as it is become quite expensive. Prior to the experience with Covid the patient really did not have any major medical problems other than hypertension he does have some mild generalized weakness following the Covid experience. 07/22/2020 on evaluation today patient appears to be doing okay in regard to his foot ulcers I feel like the wound beds are showing signs of better improvement that I do believe the Iodoflex is helping in this regard. With that being said he does have a lot of drainage currently and this is somewhat blue/green in nature which is consistent with Pseudomonas. I do think a culture today would be appropriate for [place] to evaluate and see if that is indeed the case I would likely start him on antibiotic orally as well he is not allergic to Cipro knows of no issues he has had in the past 12/21; patient was admitted to the clinic earlier this month with bilateral presumed pressure ulcers on the bottom of his feet apparently related to excessive pressure from a tilt table arrangement in the intensive care unit. Patient relates this to being on ECMO but I am not really sure that is exactly related to that. I must say I have never seen anything like this. He has fairly extensive full-thickness wounds extending from his heel towards his midfoot mostly centered laterally. There is already been some healing distally. He does not appear to have an  arterial issue. He has been using gentamicin to the wound surfaces with Iodoflex to help with ongoing debridement 1/6; this is a patient with pressure ulcers on the bottom of his feet related to excessive pressure from a standing position in the intensive care unit. He is complaining of a lot of pain in the right heel. He is not a diabetic. 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 We have been using Iodoflex to help prepare the surfaces of both wounds for an advanced treatment product. He is nonambulatory spending most of his time in a wheelchair I have asked him not to propel the wheelchair with his heels 1/13; in general his wounds look better not much surface area change we have been using Iodoflex as of last week. I did an x-ray of the right heel as the patient was complaining of pain. I had some thoughts about a stress fracture perhaps Achilles tendon problems however what it showed was erosive changes along the inferior aspect of the calcaneus he now has a MRI booked for 1/20. 1/20; in general his wounds continue to be better. Some improvement in the large narrow areas proximally in his foot. He is still complaining of pain in the right heel and tenderness in certain areas of this wound. His MRI is tonight. I am not just looking for osteomyelitis that was brought up on the x-ray I am wondering about stress fractures, tendon ruptures etc. He has no such findings on the left. Also noteworthy is that the patient had critical illness neuropathy and some of the discomfort may be actual improvement in nerve function I am just not sure. These wounds were initially in the setting of severe critical illness related to COVID-19. He was put in a standing position. He may have also been on pressors at the point contributing to tissue ischemia. By his description at some point these wounds were grossly necrotic extending proximally up into the Achilles part of his heel. I do not know  that I have ever really seen pictures of them like this although they may exist in epic We have ordered Tri layer Oasis. I am trying to stimulate some granulation in these areas. This is of course assuming the MRI is negative for infection 1/27; since the patient was last here he saw Dr. Juleen China of infectious disease. He is planned for vancomycin and ceftriaxone. Prior operative culture grew MSSA. Also ordered baseline lab work. He also ordered arterial studies although the ABIs in our clinic were normal as well as his clinical exam these were normal I do not think he needs to see vascular surgery. His ABIs at the PTA were 1.22 in the right triphasic waveforms with a normal TBI of 1.15 on the left ABI of 1.22 with triphasic waveforms and a normal TBI of 1.08. Finally he saw Dr. Amalia Hailey who will follow him in for 2 months. At this point I do not think he felt that he needed a procedure on the right calcaneal bone. Dr. Juleen China is elected for broad-spectrum antibiotic The patient is still having pain in the right heel. He walks with a walker 2/3; wounds are generally smaller. He is tolerating his IV antibiotics. I believe this is vancomycin and ceftriaxone. We are still waiting for Oasis burn in terms of his out-of-pocket max which he should be meeting soon given the IV antibiotics, MRIs etc. I have asked him to check in on this. We are using silver collagen in the meantime the wounds look better 2/10; tolerating IV vancomycin and Rocephin. We are waiting to apply for Oasis. Although I am not really sure where he is in his out-of-pocket max. 2/17 started the first application of Oasis trilayer. Still on antibiotics. The wounds have generally look better. The area on the left has a little more surface slough requiring debridement Electronic Signature(s) Signed: 09/24/2020 5:29:55 PM By: Linton Ham  MD Entered By: Linton Ham on 09/24/2020  10:38:51 -------------------------------------------------------------------------------- Physical Exam Details Patient Name: Date of Service: [name] [place] 09/24/2020 10:00 A M Medical Record Number: [id number] Patient Account Number: [id number] Date of Birth/Sex: Treating RN: Dec 12, 1975 (45 y.o. [name] Primary Care Provider: [name] Other Clinician: Referring Provider: Treating Provider/Extender: [name] in Treatment: 10 Notes Wound exam; he has areas on the point bilateral plantar heels. Undermining areas towards the top part of the middle part of the foot and some of the undermining areas at the left heel medially. The area on the left was debrided of surface slough. Oasis applied to both wound areas carefully put in the undermining area Electronic Signature(s) Signed: 09/24/2020 5:29:55 PM By: Linton Ham MD Entered By: Linton Ham on 09/24/2020 10:40:25 -------------------------------------------------------------------------------- Physician Orders Details Patient Name: Date of Service: [name], [place]. 09/24/2020 10:00 A M Medical Record Number: [id number] Patient Account Number: [id number] Date of Birth/Sex: Treating RN: 1976/01/24 (45 y.o. [name] Primary Care Provider: [name] Other Clinician: Referring Provider: Treating Provider/Extender: [name] in Treatment: 10 Verbal / Phone Orders: No Diagnosis Coding Follow-up Appointments Return Appointment in 1 week. Cellular or Tissue Based Products Wound #1 Right Calcaneus Cellular or Tissue Based Product Type: - Oasis Trilayer to both wounds. daptic or Mepitel. (DO NOT REMOVE). - Cellular or Tissue Based Product applied to wound bed, secured with steri-strips, cover with A ***apply drawtex under adaptic.*** Wound #2 Left Calcaneus Cellular or Tissue Based Product Type: - Oasis Trilayer to both wounds. daptic or Mepitel. (DO NOT  REMOVE). - Cellular or Tissue Based Product applied to wound bed, secured with steri-strips, cover with A ***apply drawtex under adaptic.*** Bathing/ Shower/ Hygiene May shower and wash wound with soap and water. - wash wounds with dial antibacterial soap Edema Control - Lymphedema / SCD / Other Bilateral Lower Extremities Elevate legs to the level of the heart or above for 30 minutes daily and/or when sitting, a frequency of: Avoid standing for long periods of time. Off-Loading Other: - keep pressure off of the bottom of your feet Wound Treatment Wound #1 - Calcaneus Wound Laterality: Right Secondary Dressing: Woven Gauze Sponge, Non-Sterile 4x4 in (Generic) 3 x Per Week/30 Days Discharge Instructions: ensure to fold and apply up to the Collagen to ensure it keeps the Collagen in place next to wound bed. Apply over primary dressing as directed. Secondary Dressing: ABD Pad, 5x9 3 x Per Week/30 Days Discharge Instructions: Apply over primary dressing as directed. Secured With: Elastic Bandage 4 inch (ACE bandage) 3 x Per Week/30 Days Discharge Instructions: Secure with ACE bandage as directed. Secured With: [employer], 4.5x3.1 (in/yd) (Generic) 3 x Per Week/30 Days Discharge Instructions: Secure with Kerlix as directed. Secured With: Paper Tape, 2x10 (in/yd) (Generic) 3 x Per Week/30 Days Discharge Instructions: Secure dressing with tape as directed. Wound #2 - Calcaneus Wound Laterality: Left Secondary Dressing: Woven Gauze Sponge, Non-Sterile 4x4 in (Generic) 3 x Per Week/30 Days Discharge Instructions: ensure to fold and apply up to the Collagen to ensure it keeps the Collagen in place next to wound bed. Apply over primary dressing as directed. Secondary Dressing: ABD Pad, 5x9 3 x Per Week/30 Days Discharge Instructions: Apply over primary dressing as directed. Secured With: Elastic Bandage 4 inch (ACE bandage) 3 x Per Week/30 Days Discharge Instructions: Secure with ACE  bandage as directed. Secured With: [employer], 4.5x3.1 (in/yd) (Generic) 3 x  Per Week/30 Days Discharge Instructions: Secure with Kerlix as directed. Secured With: Paper Tape, 2x10 (in/yd) (Generic) 3 x Per Week/30 Days Discharge Instructions: Secure dressing with tape as directed. Electronic Signature(s) Signed: 09/24/2020 5:29:55 PM By: Linton Ham MD Signed: 09/24/2020 5:51:10 PM By: [name] Entered By: [name] on 09/24/2020 10:36:01 -------------------------------------------------------------------------------- Problem List Details Patient Name: Date of Service: [name], [place] 09/24/2020 10:00 A M Medical Record Number: [id number] Patient Account Number: [id number] Date of Birth/Sex: Treating RN: 09-Aug-1975 (45 y.o. [name] Primary Care Provider: [name] Other Clinician: Referring Provider: Treating Provider/Extender: [name] in Treatment: 10 Active Problems ICD-10 Encounter Code Description Active Date MDM Diagnosis [phone] Pressure ulcer of other site, stage 3 07/15/2020 No Yes M62.81 Muscle weakness (generalized) 07/15/2020 No Yes I10 Essential (primary) hypertension 07/15/2020 No Yes L97.512 Non-pressure chronic ulcer of other part of right foot with fat layer exposed 09/03/2020 No Yes L97.522 Non-pressure chronic ulcer of other 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 09/03/2020 No Yes Inactive Problems ICD-10 Code Description Active Date Inactive Date M86.171 Other acute osteomyelitis, right ankle and foot 09/03/2020 09/03/2020 Resolved Problems Electronic Signature(s) Signed: 09/24/2020 5:29:55 PM By: Linton Ham MD Entered By: Linton Ham on 09/24/2020 10:37:24 -------------------------------------------------------------------------------- Progress Note Details Patient Name: Date of Service: [name], [place] 09/24/2020 10:00 A M Medical Record Number: [id number] Patient Account Number:  [id number] Date of Birth/Sex: Treating RN: 09-29-1975 (45 y.o. [name] Primary Care Provider: [name] Other Clinician: Referring Provider: Treating Provider/Extender: [name] in Treatment: 10 Subjective History of Present Illness (HPI) 07/15/2020 upon evaluation today patient presents for initial inspection here in our clinic concerning issues he has been having with the bottoms of his feet bilaterally. He states these actually occurred as wounds when he was hospitalized for 5 months secondary to Covid. He was apparently with tilting bed where he was in an upright position quite frequently and apparently this occurred in some way shape or form during that time. Fortunately there is no sign of active infection at this time. No fevers, chills, nausea, vomiting, or diarrhea. With that being said he still has substantial wounds on the plantar aspects of his feet Theragen require quite a bit of work to get these to heal. He has been using Santyl currently though that is been problematic both in receiving the medication as well as actually paid for it as it is become quite expensive. Prior to the experience with Covid the patient really did not have any major medical problems other than hypertension he does have some mild generalized weakness following the Covid experience. 07/22/2020 on evaluation today patient appears to be doing okay in regard to his foot ulcers I feel like the wound beds are showing signs of better improvement that I do believe the Iodoflex is helping in this regard. With that being said he does have a lot of drainage currently and this is somewhat blue/green in nature which is consistent with Pseudomonas. I do think a culture today would be appropriate for [place] to evaluate and see if that is indeed the case I would likely start him on antibiotic orally as well he is not allergic to Cipro knows of no issues he has had in the past 12/21; patient  was admitted to the clinic earlier this month with bilateral presumed pressure ulcers on the bottom of his feet apparently related to excessive pressure from a tilt table arrangement in the  intensive care unit. Patient relates this to being on ECMO but I am not really sure that is exactly related to that. I must say I have never seen anything like this. He has fairly extensive full-thickness wounds extending from his heel towards his midfoot mostly centered laterally. There is already been some healing distally. He does not appear to have an arterial issue. He has been using gentamicin to the wound surfaces with Iodoflex to help with ongoing debridement 1/6; this is a patient with pressure ulcers on the bottom of his feet related to excessive pressure from a standing position in the intensive care unit. He is complaining of a lot of pain in the right heel. He is not a diabetic. 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 We have been using Iodoflex to help prepare the surfaces of both wounds for an advanced treatment product. He is nonambulatory spending most of his time in a wheelchair I have asked him not to propel the wheelchair with his heels 1/13; in general his wounds look better not much surface area change we have been using Iodoflex as of last week. I did an x-ray of the right heel as the patient was complaining of pain. I had some thoughts about a stress fracture perhaps Achilles tendon problems however what it showed was erosive changes along the inferior aspect of the calcaneus he now has a MRI booked for 1/20. 1/20; in general his wounds continue to be better. Some improvement in the large narrow areas proximally in his foot. He is still complaining of pain in the right heel and tenderness in certain areas of this wound. His MRI is tonight. I am not just looking for osteomyelitis that was brought up on the x-ray I am wondering about stress fractures, tendon ruptures  etc. He has no such findings on the left. Also noteworthy is that the patient had critical illness neuropathy and some of the discomfort may be actual improvement in nerve function I am just not sure. These wounds were initially in the setting of severe critical illness related to COVID-19. He was put in a standing position. He may have also been on pressors at the point contributing to tissue ischemia. By his description at some point these wounds were grossly necrotic extending proximally up into the Achilles part of his heel. I do not know that I have ever really seen pictures of them like this although they may exist in epic We have ordered Tri layer Oasis. I am trying to stimulate some granulation in these areas. This is of course assuming the MRI is negative for infection 1/27; since the patient was last here he saw Dr. Juleen China of infectious disease. He is planned for vancomycin and ceftriaxone. Prior operative culture grew MSSA. Also ordered baseline lab work. He also ordered arterial studies although the ABIs in our clinic were normal as well as his clinical exam these were normal I do not think he needs to see vascular surgery. His ABIs at the PTA were 1.22 in the right triphasic waveforms with a normal TBI of 1.15 on the left ABI of 1.22 with triphasic waveforms and a normal TBI of 1.08. Finally he saw Dr. Amalia Hailey who will follow him in for 2 months. At this point I do not think he felt that he needed a procedure on the right calcaneal bone. Dr. Juleen China is elected for broad-spectrum antibiotic The patient is still having pain in the right heel. He walks with a walker  2/3; wounds are generally smaller. He is tolerating his IV antibiotics. I believe this is vancomycin and ceftriaxone. We are still waiting for Oasis burn in terms of his out-of-pocket max which he should be meeting soon given the IV antibiotics, MRIs etc. I have asked him to check in on this. We are using silver collagen in the  meantime the wounds look better 2/10; tolerating IV vancomycin and Rocephin. We are waiting to apply for Oasis. Although I am not really sure where he is in his out-of-pocket max. 2/17 started the first application of Oasis trilayer. Still on antibiotics. The wounds have generally look better. The area on the left has a little more surface slough requiring debridement Objective Constitutional Vitals Time Taken: 10:03 AM, Height: 69 in, Weight: 280 lbs, BMI: 41.3, Temperature: 98.4 F, Pulse: 72 bpm, Respiratory Rate: 18 breaths/min, Blood Pressure: 116/72 mmHg. Integumentary (Hair, Skin) Wound #1 status is Open. Original cause of wound was Pressure Injury. The wound is located on the Right Calcaneus. The wound measures 5.5cm length x 1.6cm width x 0.8cm depth; 6.912cm^2 area and 5.529cm^3 volume. There is Fat Layer (Subcutaneous Tissue) exposed. There is no tunneling or undermining noted. There is a medium amount of serosanguineous drainage noted. The wound margin is thickened. There is medium (34-66%) pink, pale granulation within the wound bed. There is a medium (34-66%) amount of necrotic tissue within the wound bed including Adherent Slough. Wound #2 status is Open. Original cause of wound was Pressure Injury. The wound is located on the Left Calcaneus. The wound measures 5.6cm length x 2.4cm width x 0.5cm depth; 10.556cm^2 area and 5.278cm^3 volume. There is Fat Layer (Subcutaneous Tissue) exposed. There is no tunneling or undermining noted. There is a medium amount of serosanguineous drainage noted. The wound margin is thickened. There is medium (34-66%) pink granulation within the wound bed. There is a medium (34-66%) amount of necrotic tissue within the wound bed including Adherent Slough. Assessment Active Problems ICD-10 Pressure ulcer of other site, stage 3 Muscle weakness (generalized) Essential (primary) hypertension Non-pressure chronic ulcer of other part of right foot with fat  layer exposed Non-pressure chronic ulcer of other 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 Procedures Wound #2 Pre-procedure diagnosis of Wound #2 is a Pressure Ulcer located on the Left Calcaneus . There was a Selective/Open Wound Non-Viable Tissue Debridement with a total area of 13.44 sq cm performed by Ricard Dillon., MD. With the following instrument(s): Curette to remove Non-Viable tissue/material. Material removed includes [facility] after achieving pain control using Lidocaine 4% Topical Solution. A time out was conducted at 10:20, prior to the start of the procedure. A Moderate amount of bleeding was controlled with Pressure. The procedure was tolerated well with a pain level of 0 throughout and a pain level of 0 following the procedure. Post Debridement Measurements: 5.6cm length x 2.4cm width x 0.5cm depth; 5.278cm^3 volume. Post debridement Stage noted as Category/Stage III. Character of Wound/Ulcer Post Debridement is improved. Post procedure Diagnosis Wound #2: Same as Pre-Procedure Pre-procedure diagnosis of Wound #2 is a Pressure Ulcer located on the Left Calcaneus. A skin graft procedure using a bioengineered skin substitute/cellular or tissue based product was performed by Ricard Dillon., MD with the following instrument(s): Forceps and Scissors. Oasis tri-layer was applied and secured with Steri-Strips and drawtex and adaptic. 17.5 sq cm of product was utilized and 0 sq cm was wasted due to wound size. Post Application, no dressing was applied. A Time Out was conducted  at 10:25, prior to the start of the procedure. The procedure was tolerated well with a pain level of 0 throughout and a pain level of 0 following the procedure. Post procedure Diagnosis Wound #2: Same as Pre-Procedure . Wound #1 Pre-procedure diagnosis of Wound #1 is a Pressure Ulcer located on the Right Calcaneus. A skin graft procedure using a bioengineered skin substitute/cellular or tissue based product  was performed by Ricard Dillon., MD with the following instrument(s): Forceps and Scissors. Oasis tri-layer was applied and secured with Steri-Strips and drawtex and adaptic. 17.5 sq cm of product was utilized and 0 sq cm was wasted due to wound size. Post Application, no dressing was applied. A Time Out was conducted at 10:25, prior to the start of the procedure. The procedure was tolerated well with a pain level of 0 throughout and a pain level of 0 following the procedure. Post procedure Diagnosis Wound #1: Same as Pre-Procedure . Plan Follow-up Appointments: Return Appointment in 1 week. Cellular or Tissue Based Products: Wound #1 Right Calcaneus: Cellular or Tissue Based Product Type: - Oasis Trilayer to both wounds. Cellular or Tissue Based Product applied to wound bed, secured with steri-strips, cover with Adaptic or Mepitel. (DO NOT REMOVE). - ***apply drawtex under adaptic.*** Wound #2 Left Calcaneus: Cellular or Tissue Based Product Type: - Oasis Trilayer to both wounds. Cellular or Tissue Based Product applied to wound bed, secured with steri-strips, cover with Adaptic or Mepitel. (DO NOT REMOVE). - ***apply drawtex under adaptic.*** Bathing/ Shower/ Hygiene: May shower and wash wound with soap and water. - wash wounds with dial antibacterial soap Edema Control - Lymphedema / SCD / Other: Elevate legs to the level of the heart or above for 30 minutes daily and/or when sitting, a frequency of: Avoid standing for long periods of time. Off-Loading: Other: - keep pressure off of the bottom of your feet WOUND #1: - Calcaneus Wound Laterality: Right Secondary Dressing: Woven Gauze Sponge, Non-Sterile 4x4 in (Generic) 3 x Per Week/30 Days Discharge Instructions: ensure to fold and apply up to the Collagen to ensure it keeps the Collagen in place next to wound bed. Apply over primary dressing as directed. Secondary Dressing: ABD Pad, 5x9 3 x Per Week/30 Days Discharge  Instructions: Apply over primary dressing as directed. Secured With: Elastic Bandage 4 inch (ACE bandage) 3 x Per Week/30 Days Discharge Instructions: Secure with ACE bandage as directed. Secured With: [employer], 4.5x3.1 (in/yd) (Generic) 3 x Per Week/30 Days Discharge Instructions: Secure with Kerlix as directed. Secured With: Paper T ape, 2x10 (in/yd) (Generic) 3 x Per Week/30 Days Discharge Instructions: Secure dressing with tape as directed. WOUND #2: - Calcaneus Wound Laterality: Left Secondary Dressing: Woven Gauze Sponge, Non-Sterile 4x4 in (Generic) 3 x Per Week/30 Days Discharge Instructions: ensure to fold and apply up to the Collagen to ensure it keeps the Collagen in place next to wound bed. Apply over primary dressing as directed. Secondary Dressing: ABD Pad, 5x9 3 x Per Week/30 Days Discharge Instructions: Apply over primary dressing as directed. Secured With: Elastic Bandage 4 inch (ACE bandage) 3 x Per Week/30 Days Discharge Instructions: Secure with ACE bandage as directed. Secured With: [employer], 4.5x3.1 (in/yd) (Generic) 3 x Per Week/30 Days Discharge Instructions: Secure with Kerlix as directed. Secured With: Paper T ape, 2x10 (in/yd) (Generic) 3 x Per Week/30 Days Discharge Instructions: Secure dressing with tape as directed. 1. Oasis trilayer carefully to both wound areas. 2. Advised to rigorously offload these wounds. He  expressed understanding Electronic Signature(s) Signed: 09/24/2020 5:29:55 PM By: Linton Ham MD Signed: 09/24/2020 5:51:10 PM By: [name] Entered By: [name] on 09/24/2020 11:05:55 -------------------------------------------------------------------------------- SuperBill Details Patient Name: Date of Service: [name], [place]. 09/24/2020 Medical Record Number: [id number] Patient Account Number: [id number] Date of Birth/Sex: Treating RN: 1976-01-26 (45 y.o. [name], [name] Primary Care Provider: [name] Other Clinician: Referring Provider: Treating Provider/Extender: [name] in Treatment: 10 Diagnosis Coding ICD-10 Codes Code Description [phone] Pressure ulcer of other site, stage 3 M62.81 Muscle weakness (generalized) I10 Essential (primary) hypertension L97.512 Non-pressure chronic ulcer of other part of right foot with fat layer exposed L97.522 Non-pressure chronic ulcer of other 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 Facility Procedures CPT4 Code: 18299371 Description: [id number] Oasis Ultra per sq CM Modifier: Quantity: 21 CPT4 Code: [id number] Description: 10258 - SKIN SUB GRAFT FACE/NK/HF/G ICD-10 Diagnosis Description L97.512 Non-pressure chronic ulcer of other part of right foot with fat layer exposed L97.522 Non-pressure chronic ulcer of other 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 Modifier: Quantity: 1 CPT4 Code: [id number] Description: 15271 - SKIN SUB GRAFT TRNK/ARM/LEG ICD-10 Diagnosis Description L97.512 Non-pressure chronic ulcer of other part of right foot with fat layer exposed L97.522 Non-pressure chronic ulcer of other 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 Modifier: Quantity: 1 Physician Procedures : CPT4 Code Description Modifier [id number] - WC PHYS SKIN SUB GRAFT FACE/NK/HF/G ICD-10 Diagnosis Description L97.512 Non-pressure chronic ulcer of other part of right foot with fat layer exposed L97.522 Non-pressure chronic ulcer of other part of  left foot with fat layer exposed Quantity: 1 : [id number] - WC PHYS SKIN SUB GRAFT TRNK/ARM/LEG ICD-10 Diagnosis Description L97.512 Non-pressure chronic ulcer of other part of right foot with fat layer exposed L97.522 Non-pressure chronic ulcer of other 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 Quantity: 1 Electronic Signature(s) Signed: 09/24/2020 5:29:55 PM By: Linton Ham MD Signed: 09/24/2020 5:51:10 PM By: [name] Entered By: [name] on 09/24/2020 67:12:45

## 2020-09-25 NOTE — Progress Notes (Signed)
AKAASH, [name] ([id number]) Visit Report for 09/24/2020 Arrival Information Details Patient Name: Date of Service: [name], [name] 09/24/2020 10:00 A M Medical Record Number: [id number] Patient Account Number: [id number] Date of Birth/Sex: Treating RN: [birth year] (45 y.o. [name] Primary Care Leo Weyandt: [name] Other Clinician: Referring Whalen Trompeter: Treating Trever Streater/Extender: [name] in Treatment: 10 Visit Information History Since Last Visit Added or deleted any medications: No Patient Arrived: Wheel Chair Any new allergies or adverse reactions: No Arrival Time: 10:00 Had a fall or experienced change in No Accompanied By: wife activities of daily living that may affect Transfer Assistance: None risk of falls: Patient Identification Verified: Yes Signs or symptoms of abuse/neglect since last visito No Secondary Verification Process Completed: Yes Hospitalized since last visit: No Patient Requires Transmission-Based Precautions: No Implantable device outside of the clinic excluding No Patient Has Alerts: No cellular tissue based products placed in the center since last visit: Has Dressing in Place as Prescribed: Yes Pain Present Now: Yes Electronic Signature(s) Signed: 09/25/2020 4:25:56 PM By: Levan Hurst RN, BSN Entered By: Levan Hurst on 09/24/2020 10:03:26 -------------------------------------------------------------------------------- Encounter Discharge Information Details Patient Name: Date of Service: [name], [place]. 09/24/2020 10:00 A M Medical Record Number: [id number] Patient Account Number: [id number] Date of Birth/Sex: Treating RN: 1976-06-23 (45 y.o. [name] Primary Care Taffy Delconte: [name] Other Clinician: Referring Kamarii Carton: Treating Erminia Mcnew/Extender: [name] in Treatment: 10 Encounter Discharge Information Items Post Procedure Vitals Discharge Condition:  Stable Temperature (F): 98.4 Ambulatory Status: Wheelchair Pulse (bpm): 72 Discharge Destination: Home Respiratory Rate (breaths/min): 18 Transportation: Private Auto Blood Pressure (mmHg): 116/72 Accompanied By: spouse Schedule Follow-up Appointment: Yes Clinical Summary of Care: Electronic Signature(s) Signed: 09/24/2020 5:51:10 PM By: [name] Entered By: [name] on 09/24/2020 11:51:16 -------------------------------------------------------------------------------- Lower Extremity Assessment Details Patient Name: Date of Service: [name], [name] 09/24/2020 10:00 A M Medical Record Number: [id number] Patient Account Number: [id number] Date of Birth/Sex: Treating RN: May 19, 1976 (45 y.o. [name] Primary Care Eaven Schwager: [name] Other Clinician: Referring Letecia Arps: Treating Syerra Abdelrahman/Extender: [name] in Treatment: 10 Edema Assessment Assessed: [name]: No] [name]: No] Edema: [Left: Yes] [Right: Yes] Calf Left: Right: Point of Measurement: 34 cm From Medial Instep 40 cm 40.5 cm Ankle Left: Right: Point of Measurement: 12 cm From Medial Instep 24.5 cm 23.8 cm Vascular Assessment Pulses: Dorsalis Pedis Palpable: [Left:Yes] [Right:Yes] Electronic Signature(s) Signed: 09/25/2020 4:25:56 PM By: Levan Hurst RN, BSN Entered By: Levan Hurst on 09/24/2020 10:18:51 -------------------------------------------------------------------------------- Multi Wound Chart Details Patient Name: Date of Service: [name], [name] 09/24/2020 10:00 A M Medical Record Number: [id number] Patient Account Number: [id number] Date of Birth/Sex: Treating RN: 04/15/1976 (45 y.o. [name] Primary Care Tyara Dassow: [name] Other Clinician: Referring Chantee Cerino: Treating Johna Kearl/Extender: [name] in Treatment: 10 Vital Signs Height(in): 69 Pulse(bpm): 24 Weight(lbs): 280 Blood Pressure(mmHg): 116/72 Body  Mass Index(BMI): 41 Temperature(F): 98.4 Respiratory Rate(breaths/min): 18 Photos: [1:No Photos Right Calcaneus] [2:No Photos Left Calcaneus] [N/A:N/A N/A] Wound Location: [1:Pressure Injury] [2:Pressure Injury] [N/A:N/A] Wounding Event: [1:Pressure Ulcer] [2:Pressure Ulcer] [N/A:N/A] Primary Etiology: [1:Asthma, Angina, Hypertension] [2:Asthma, Angina, Hypertension] [N/A:N/A] Comorbid History: [1:10/07/2019] [2:10/07/2019] [N/A:N/A] Date Acquired: [1:10] [2:10] [N/A:N/A] Weeks of Treatment: [1:Open] [2:Open] [N/A:N/A] Wound Status: [1:5.5x1.6x0.8] [2:5.6x2.4x0.5] [N/A:N/A] Measurements L x W x D (cm) [1:6.912] [2:10.556] [N/A:N/A] A (cm) : rea [1:5.529] [2:5.278] [N/A:N/A] Volume (cm) : [1:78.10%] [2:61.00%] [N/A:N/A] % Reduction in A rea: [1:82.50%] [2:80.50%] [N/A:N/A] %  Reduction in Volume: [1:Category/Stage III] [2:Category/Stage III] [N/A:N/A] Classification: [1:Medium] [2:Medium] [N/A:N/A] Exudate A mount: [1:Serosanguineous] [2:Serosanguineous] [N/A:N/A] Exudate Type: [1:red, brown] [2:red, brown] [N/A:N/A] Exudate Color: [1:Thickened] [2:Thickened] [N/A:N/A] Wound Margin: [1:Medium (34-66%)] [2:Medium (34-66%)] [N/A:N/A] Granulation A mount: [1:Pink, Pale] [2:Pink] [N/A:N/A] Granulation Quality: [1:Medium (34-66%)] [2:Medium (34-66%)] [N/A:N/A] Necrotic A mount: [1:Fat Layer (Subcutaneous Tissue): Yes Fat Layer (Subcutaneous Tissue): Yes N/A] Exposed Structures: [1:Fascia: No Tendon: No Muscle: No Joint: No Bone: No Small (1-33%)] [2:Fascia: No Tendon: No Muscle: No Joint: No Bone: No Small (1-33%)] [N/A:N/A] Epithelialization: [1:N/A] [2:Debridement - Selective/Open Wound N/A] Debridement: Pre-procedure Verification/Time Out N/A [2:10:20] [N/A:N/A] Taken: [1:N/A] [2:Lidocaine 4% Topical Solution] [N/A:N/A] Pain Control: [1:N/A] [2:Slough] [N/A:N/A] Tissue Debrided: [1:N/A] [2:Non-Viable Tissue] [N/A:N/A] Level: [1:N/A] [2:13.44] [N/A:N/A] Debridement A (sq cm): [1:rea  N/A] [2:Curette] [N/A:N/A] Instrument: [1:N/A] [2:Moderate] [N/A:N/A] Bleeding: [1:N/A] [2:Pressure] [N/A:N/A] Hemostasis A chieved: [1:N/A] [2:0] [N/A:N/A] Procedural Pain: [1:N/A] [2:0] [N/A:N/A] Post Procedural Pain: [1:N/A] [2:Procedure was tolerated well] [N/A:N/A] Debridement Treatment Response: [1:N/A] [2:5.6x2.4x0.5] [N/A:N/A] Post Debridement Measurements L x W x D (cm) [1:N/A] [2:5.278] [N/A:N/A] Post Debridement Volume: (cm) [1:N/A] [2:Category/Stage III] [N/A:N/A] Post Debridement Stage: [1:Cellular or Tissue Based Product] [2:Cellular or Tissue Based Product] [N/A:N/A] Procedures Performed: [2:Debridement] Treatment Notes Electronic Signature(s) Signed: 09/24/2020 5:29:55 PM By: Linton Ham MD Signed: 09/24/2020 5:51:10 PM By: [name] Entered By: Linton Ham on 09/24/2020 10:37:33 -------------------------------------------------------------------------------- Multi-Disciplinary Care Plan Details Patient Name: Date of Service: [name], [place] 09/24/2020 10:00 A M Medical Record Number: [id number] Patient Account Number: [id number] Date of Birth/Sex: Treating RN: [birth year] (45 y.o. [name] Primary Care Yeng Frankie: [name] Other Clinician: Referring Nyriah Coote: Treating Trustin Chapa/Extender: [name] in Treatment: 10 Active Inactive Abuse / Safety / Falls / Self Care Management Nursing Diagnoses: Potential for falls Goals: Patient/caregiver will verbalize/demonstrate measures taken to prevent injury and/or falls Date Initiated: 07/15/2020 Target Resolution Date: 11/06/2020 Goal Status: Active Interventions: Assess fall risk on admission and as needed Assess impairment of mobility on admission and as needed per policy Notes: Wound/Skin Impairment Nursing Diagnoses: Impaired tissue integrity Knowledge deficit related to ulceration/compromised skin integrity Goals: Patient/caregiver will verbalize understanding of  skin care regimen Date Initiated: 07/15/2020 Target Resolution Date: 12/04/2020 Goal Status: Active Ulcer/skin breakdown will have a volume reduction of 30% by week 4 Date Initiated: 07/15/2020 Date Inactivated: 08/20/2020 Target Resolution Date: 09/03/2020 Goal Status: Unmet Unmet Reason: no major changes. Interventions: Assess patient/caregiver ability to obtain necessary supplies Assess patient/caregiver ability to perform ulcer/skin care regimen upon admission and as needed Assess ulceration(s) every visit Provide education on ulcer and skin care Treatment Activities: Skin care regimen initiated : 07/15/2020 Topical wound management initiated : 07/15/2020 Notes: Electronic Signature(s) Signed: 09/24/2020 5:51:10 PM By: [name] Entered By: [name] on 09/24/2020 11:02:03 -------------------------------------------------------------------------------- Pain Assessment Details Patient Name: Date of Service: [name], [place] 09/24/2020 10:00 A M Medical Record Number: [id number] Patient Account Number: [id number] Date of Birth/Sex: Treating RN: 07-Mar-1976 (45 y.o. [name] Primary Care Dusty Raczkowski: [name] Other Clinician: Referring Ivor Kishi: Treating Avital Dancy/Extender: [name] in Treatment: 10 Active Problems Location of Pain Severity and Description of Pain Patient Has Paino Yes Site Locations Pain Location: Pain in Ulcers With Dressing Change: Yes Duration of the Pain. Constant / Intermittento Constant Rate the pain. Current Pain Level: 3 Character of Pain Describe the Pain: Tender Pain Management and Medication Current Pain Management: Medication: Yes Cold Application: No Rest: No Massage: No Activity: No T.E.N.S.: No Heat  Application: No Leg drop or elevation: No Is the Current Pain Management Adequate: Adequate How does your wound impact your activities of daily livingo Sleep: No Bathing: No Appetite:  No Relationship With Others: No Bladder Continence: No Emotions: No Bowel Continence: No Work: No Toileting: No Drive: No Dressing: No Hobbies: No Engineer, maintenance) Signed: 09/25/2020 4:25:56 PM By: Levan Hurst RN, BSN Entered By: Levan Hurst on 09/24/2020 10:04:59 -------------------------------------------------------------------------------- Patient/Caregiver Education Details Patient Name: Date of Service: GO [place], [name] 2/17/2022andnbsp10:00 [place] Record Number: [id number] Patient Account Number: [id number] Date of Birth/Gender: Treating RN: [birth year] (45 y.o. [name] Primary Care Physician: [name] Other Clinician: Referring Physician: Treating Physician/Extender: [name] in Treatment: 10 Education Assessment Education Provided To: Patient Education Topics Provided Wound/Skin Impairment: Handouts: Skin Care Do's and Dont's Methods: Explain/Verbal Responses: Reinforcements needed Electronic Signature(s) Signed: 09/24/2020 5:51:10 PM By: [name] Entered By: [name] on 09/24/2020 11:02:28 -------------------------------------------------------------------------------- Wound Assessment Details Patient Name: Date of Service: [name], [place] 09/24/2020 10:00 A M Medical Record Number: [id number] Patient Account Number: [id number] Date of Birth/Sex: Treating RN: [birth year] (45 y.o. [name] Primary Care Jairo Bellew: [name] Other Clinician: Referring Marayah Higdon: Treating Eula Mazzola/Extender: [name] in Treatment: 10 Wound Status Wound Number: 1 Primary Etiology: Pressure Ulcer Wound Location: Right Calcaneus Wound Status: Open Wounding Event: Pressure Injury Comorbid History: Asthma, Angina, Hypertension Date Acquired: 10/07/2019 Weeks Of Treatment: 10 Clustered Wound: No Wound Measurements Length: (cm) 5.5 Width: (cm) 1.6 Depth: (cm) 0.8 Area: (cm)  6.912 Volume: (cm) 5.529 % Reduction in Area: 78.1% % Reduction in Volume: 82.5% Epithelialization: Small (1-33%) Tunneling: No Undermining: No Wound Description Classification: Category/Stage III Wound Margin: Thickened Exudate Amount: Medium Exudate Type: Serosanguineous Exudate Color: red, brown Foul Odor After Cleansing: No Slough/Fibrino Yes Wound Bed Granulation Amount: Medium (34-66%) Exposed Structure Granulation Quality: Pink, Pale Fascia Exposed: No Necrotic Amount: Medium (34-66%) Fat Layer (Subcutaneous Tissue) Exposed: Yes Necrotic Quality: Adherent Slough Tendon Exposed: No Muscle Exposed: No Joint Exposed: No Bone Exposed: No Treatment Notes Wound #1 (Calcaneus) Wound Laterality: Right Cleanser Peri-Wound Care Topical Primary Dressing Secondary Dressing Woven Gauze Sponge, Non-Sterile 4x4 in Discharge Instruction: ensure to fold and apply up to the Collagen to ensure it keeps the Collagen in place next to wound bed. Apply over primary dressing as directed. ABD Pad, 5x9 Discharge Instruction: Apply over primary dressing as directed. Secured With Elastic Bandage 4 inch (ACE bandage) Discharge Instruction: Secure with ACE bandage as directed. Kerlix Roll Sterile, 4.5x3.1 (in/yd) Discharge Instruction: Secure with Kerlix as directed. Paper Tape, 2x10 (in/yd) Discharge Instruction: Secure dressing with tape as directed. Compression Wrap Compression Stockings Add-Ons Notes oasis tri-layer applied to wound bed by MD. secured with drawtex, adaptic, and steri-strips to hold in place. Electronic Signature(s) Signed: 09/25/2020 4:25:56 PM By: Levan Hurst RN, BSN Entered By: Levan Hurst on 09/24/2020 10:17:32 -------------------------------------------------------------------------------- Wound Assessment Details Patient Name: Date of Service: [name], [place] 09/24/2020 10:00 A M Medical Record Number: [id number] Patient Account Number:  [id number] Date of Birth/Sex: Treating RN: 1976/03/12 (45 y.o. [name] Primary Care Nickalas Mccarrick: [name] Other Clinician: Referring Tonetta Napoles: Treating Nomar Broad/Extender: [name] in Treatment: 10 Wound Status Wound Number: 2 Primary Etiology: Pressure Ulcer Wound Location: Left Calcaneus Wound Status: Open Wounding Event: Pressure Injury Comorbid History: Asthma, Angina, Hypertension Date Acquired: 10/07/2019 Weeks Of Treatment: 10 Clustered Wound: No Wound Measurements Length: (cm) 5.6 Width: (cm) 2.4  Depth: (cm) 0.5 Area: (cm) 10.556 Volume: (cm) 5.278 % Reduction in Area: 61% % Reduction in Volume: 80.5% Epithelialization: Small (1-33%) Tunneling: No Undermining: No Wound Description Classification: Category/Stage III Wound Margin: Thickened Exudate Amount: Medium Exudate Type: Serosanguineous Exudate Color: red, brown Foul Odor After Cleansing: No Slough/Fibrino Yes Wound Bed Granulation Amount: Medium (34-66%) Exposed Structure Granulation Quality: Pink Fascia Exposed: No Necrotic Amount: Medium (34-66%) Fat Layer (Subcutaneous Tissue) Exposed: Yes Necrotic Quality: Adherent Slough Tendon Exposed: No Muscle Exposed: No Joint Exposed: No Bone Exposed: No Treatment Notes Wound #2 (Calcaneus) Wound Laterality: Left Cleanser Peri-Wound Care Topical Primary Dressing Secondary Dressing Woven Gauze Sponge, Non-Sterile 4x4 in Discharge Instruction: ensure to fold and apply up to the Collagen to ensure it keeps the Collagen in place next to wound bed. Apply over primary dressing as directed. ABD Pad, 5x9 Discharge Instruction: Apply over primary dressing as directed. Secured With Elastic Bandage 4 inch (ACE bandage) Discharge Instruction: Secure with ACE bandage as directed. Kerlix Roll Sterile, 4.5x3.1 (in/yd) Discharge Instruction: Secure with Kerlix as directed. Paper Tape, 2x10 (in/yd) Discharge Instruction: Secure  dressing with tape as directed. Compression Wrap Compression Stockings Add-Ons Notes oasis tri-layer applied to wound bed by MD. secured with drawtex, adaptic, and steri-strips to hold in place. Electronic Signature(s) Signed: 09/25/2020 4:25:56 PM By: Levan Hurst RN, BSN Entered By: Levan Hurst on 09/24/2020 10:17:42 -------------------------------------------------------------------------------- [place] Details Patient Name: Date of Service: [name], [place] 09/24/2020 10:00 A M Medical Record Number: [id number] Patient Account Number: [id number] Date of Birth/Sex: Treating RN: 1976/07/10 (45 y.o. [name] Primary Care Ralph Benavidez: [name] Other Clinician: Referring Morgen Linebaugh: Treating Byrne Capek/Extender: [name] in Treatment: 10 Vital Signs Time Taken: 10:03 Temperature (F): 98.4 Height (in): 69 Pulse (bpm): 72 Weight (lbs): 280 Respiratory Rate (breaths/min): 18 Body Mass Index (BMI): 41.3 Blood Pressure (mmHg): 116/72 Reference Range: 80 - 120 mg / dl Electronic Signature(s) Signed: 09/25/2020 4:25:56 PM By: Levan Hurst RN, BSN Entered By: Levan Hurst on 09/24/2020 10:04:18

## 2020-09-27 DIAGNOSIS — M86171 Other acute osteomyelitis, right ankle and foot: Secondary | ICD-10-CM | POA: Diagnosis not present

## 2020-09-27 DIAGNOSIS — L03115 Cellulitis of right lower limb: Secondary | ICD-10-CM | POA: Diagnosis not present

## 2020-09-28 ENCOUNTER — Ambulatory Visit: Payer: Managed Care, Other (non HMO) | Admitting: Podiatry

## 2020-09-28 NOTE — Progress Notes (Signed)
[facility]  CHIEF COMPLAINT:    Follow up for calcaneal osteomyelitis  SUBJECTIVE:    [name] is a 45 y.o. male with PMHx as below who presents to the clinic for calcaneal osteomyelitis.  Last seen by me 08/31/2020.   Since seeing me approximately 4 weeks ago, patient underwent ABIs on 09/01/2020.  These were within normal range.  Patient saw his podiatrist, Dr. Amalia Hailey, on 09/02/2020.  No further operative management was recommended.  He was advised to follow-up in the wound care clinic as scheduled.  Patient underwent PICC line placement on 09/04/2020 and has been receiving daptomycin 8 mg/kg daily and ceftriaxone 2 g daily since that time.  He has been following with Dr. Dellia Nims at the wound care center on a weekly basis.  Most recently saw him on 09/24/2020.  Wounds are generally looking better per his report.  I have been monitoring patient's home health labs.  His ESR has gone from 51 at the start of therapy and most recently is 43.  His CRP has been relatively stable ranging from 10-15 over the course of his antibiotic therapy.  Please see A&P for the details of today's visit and status of the patient's medical problems.   Patient's Medications  New Prescriptions   No medications on file  Previous Medications   ALBUTEROL SULFATE (PROAIR RESPICLICK) 601 (90 BASE) MCG/ACT AEPB    Use 1 puff as needed   AMLODIPINE (NORVASC) 2.5 MG TABLET    Take 1 tablet (2.5 mg total) by mouth daily.   CEFTRIAXONE (ROCEPHIN) 1 G SOLR INJECTION    Inject 1 g into the vein.   DAPTOMYCIN (CUBICIN) IVPB    Inject into the vein.   DULOXETINE (CYMBALTA) 60 MG CAPSULE    Take 1 capsule (60 mg total) by mouth daily.   FEXOFENADINE (ALLEGRA) 180 MG TABLET    1 tablet   HYDROCODONE-ACETAMINOPHEN (NORCO) 5-325 MG TABLET    Take 1 tablet by mouth 2 (two) times daily as needed for severe pain.   METOPROLOL TARTRATE (LOPRESSOR) 25 MG TABLET    TAKE 1 TABLET BY MOUTH TWICE A  DAY   MONTELUKAST (SINGULAIR) 10 MG TABLET    Take 10 mg by mouth daily.   PANTOPRAZOLE (PROTONIX) 40 MG TABLET    Take 1 tablet (40 mg total) by mouth daily.   SYMBICORT 160-4.5 MCG/ACT INHALER    Inhale 2 puffs into the lungs daily as needed (cough or wheeze). Inhale two puffs twice daily   TRAZODONE (DESYREL) 100 MG TABLET    Take 1-2 tablets (100-200 mg total) by mouth at bedtime.   TRIAMCINOLONE (NASACORT) 55 MCG/ACT AERO NASAL INHALER    Place 2 sprays into the nose daily.  Modified Medications   No medications on file  Discontinued Medications   AMLODIPINE (NORVASC) 2.5 MG TABLET    Take 1 tablet by mouth daily.   BUDESONIDE-FORMOTEROL (SYMBICORT) 160-4.5 MCG/ACT INHALER    2 puffs      Past Medical History:  Diagnosis Date  . Anginal pain ([place])    with covid  . Anxiety   . Asthma   . Dyspnea   . GERD (gastroesophageal reflux disease)   . Headache   . History of kidney stones    LEFT URETERAL STONE  . HTN (hypertension)   . Pancreatitis 2018   GALLBALDDER SLUDGE CAUSED ISSUED RESOLVED  . Pneumonia 07/2019   covid    Social  History   Tobacco Use  . Smoking status: Never Smoker  . Smokeless tobacco: Never Used  Vaping Use  . Vaping Use: Never used  Substance Use Topics  . Alcohol use: Yes    Comment: rarely 2-3 times a year  . Drug use: No    Family History  Problem Relation Age of Onset  . Asthma Brother   . Diabetes Father   . Hypertension Father   . Diabetes Paternal Grandmother   . Hypertension Paternal Grandmother   . Migraines Mother   . GER disease Mother   . Pancreatic cancer Maternal Grandmother   . Heart disease Maternal Grandfather     No Known Allergies  Review of Systems  Constitutional: Negative for chills and fever.  Gastrointestinal: Negative.   Musculoskeletal: Positive for joint pain.  Skin: Positive for itching.       + Wound     OBJECTIVE:    Vitals:   09/29/20 0848  BP: 133/78  Pulse: 78  Temp: 98 F (36.7 C)   TempSrc: Oral  SpO2: 90%  Weight: (!) 302 lb (137 kg)  Height: 5' 9"  (1.753 m)   Body mass index is 44.6 kg/m.  Physical Exam Constitutional:      Appearance: Normal appearance.  Pulmonary:     Effort: Pulmonary effort is normal. No respiratory distress.  Musculoskeletal:        General: Signs of injury present.  Skin:    Comments: Right PICC line with mild excoriations  Neurological:     General: No focal deficit present.     Mental Status: He is alert and oriented to person, place, and time.  Psychiatric:        Mood and Affect: Mood normal.        Behavior: Behavior normal.      Labs and Microbiology: CBC Latest Ref Rng & Units 08/31/2020 03/17/2020 12/02/2019  WBC 3.8 - 10.8 Thousand/uL 7.7 7.3 8.4  Hemoglobin 13.2 - 17.1 g/dL 16.0 15.8 13.2  Hematocrit 38.5 - 50.0 % 47.3 51.2 42.7  Platelets 140 - 400 Thousand/uL 138(L) 170 178   CMP Latest Ref Rng & Units 08/31/2020 03/17/2020 12/02/2019  Glucose 65 - 99 mg/dL 129(H) 124(H) 98  BUN 7 - 25 mg/dL 20 19 33(H)  Creatinine 0.60 - 1.35 mg/dL 1.10 1.21 0.93  Sodium 135 - 146 mmol/L 140 140 137  Potassium 3.5 - 5.3 mmol/L 4.2 4.3 4.4  Chloride 98 - 110 mmol/L 103 103 98  CO2 20 - 32 mmol/L 28 24 29   Calcium 8.6 - 10.3 mg/dL 9.1 9.5 9.7  Total Protein 6.1 - 8.1 g/dL 7.3 - 7.9  Total Bilirubin 0.2 - 1.2 mg/dL 0.3 - 0.5  Alkaline Phos 38 - 126 U/L - - 76  AST 10 - 40 U/L 14 - 13(L)  ALT 9 - 46 U/L 11 - 15       ASSESSMENT & PLAN:    Acute osteomyelitis (HCC) Patient with longstanding heel wound that developed during his prolonged hospitalization last year.  Underwent debridement and graft matrix placement in August 2021 with an overall improvement in the appearance and size of his wound.  OR cultures at that time did grow MSSA, however, we have been providing more broad antibiotic coverage in the setting of calcaneal osteomyelitis diagnosed by MRI in January.  He has been continuing to follow with wound care on a weekly  basis and is scheduled to follow-up with podiatry on March 9.  PLAN: . Continue  daptomycin 8 mg/kg daily (1000 mg) and ceftriaxone 2 g daily x6 weeks . Planned end of therapy = 10/16/2020 . Continue monitoring OP AT labs . Continue wound care, glycemic control . Continue to follow-up with podiatry . Advised he could try Benadryl OTC as needed for pruritis at PICC dressing site . RTC 2 weeks  Encounter for therapeutic drug monitoring He continues on daptomycin and ceftriaxone for calcaneal osteomyelitis and we have been receiving his outpatient labs on a weekly basis.  PLAN: . Continue lab monitoring weekly with CBC, CMP, ESR, CRP, CK    Raynelle [facility] for Infectious Disease [facility] 09/29/2020, 9:12 AM

## 2020-09-29 ENCOUNTER — Ambulatory Visit: Payer: BC Managed Care – PPO | Admitting: Internal Medicine

## 2020-09-29 ENCOUNTER — Other Ambulatory Visit: Payer: Self-pay

## 2020-09-29 ENCOUNTER — Encounter: Payer: Self-pay | Admitting: Internal Medicine

## 2020-09-29 DIAGNOSIS — M8610 Other acute osteomyelitis, unspecified site: Secondary | ICD-10-CM

## 2020-09-29 DIAGNOSIS — Z5181 Encounter for therapeutic drug level monitoring: Secondary | ICD-10-CM

## 2020-09-29 HISTORY — DX: Encounter for therapeutic drug level monitoring: Z51.81

## 2020-09-29 NOTE — Assessment & Plan Note (Addendum)
Patient with longstanding heel wound that developed during his prolonged hospitalization last year.  Underwent debridement and graft matrix placement in August 2021 with an overall improvement in the appearance and size of his wound.  OR cultures at that time did grow MSSA, however, we have been providing more broad antibiotic coverage in the setting of calcaneal osteomyelitis diagnosed by MRI in January.  He has been continuing to follow with wound care on a weekly basis and is scheduled to follow-up with podiatry on March 9.  PLAN: . Continue daptomycin 8 mg/kg daily (1000 mg) and ceftriaxone 2 g daily x6 weeks . Planned end of therapy = 10/16/2020 . Continue monitoring OP AT labs . Continue wound care, glycemic control . Continue to follow-up with podiatry . Advised he could try Benadryl OTC as needed for pruritis at PICC dressing site . RTC 2 weeks.  Will consider giving an additional 2 weeks of PO tail coverage at end of therapy

## 2020-09-29 NOTE — Patient Instructions (Addendum)
Thank you for coming to see me today. It was a pleasure seeing you.  To Do: Marland Kitchen Continue daptomycin and ceftriaxone via PICC line for a total of 6 weeks through 10/16/2020 . Continue to follow up with wound care and podiatry . Follow up with [place] in about 2 weeks  If you have any questions or concerns, please do not hesitate to call the office at [phone].  Take Care,   Jule Ser, DO

## 2020-09-29 NOTE — Assessment & Plan Note (Signed)
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  PLAN: . Continue lab monitoring weekly with CBC, CMP, ESR, CRP, CK

## 2020-09-30 DIAGNOSIS — L03115 Cellulitis of right lower limb: Secondary | ICD-10-CM | POA: Diagnosis not present

## 2020-09-30 DIAGNOSIS — M86171 Other acute osteomyelitis, right ankle and foot: Secondary | ICD-10-CM | POA: Diagnosis not present

## 2020-10-01 ENCOUNTER — Other Ambulatory Visit: Payer: Self-pay

## 2020-10-01 ENCOUNTER — Encounter (HOSPITAL_BASED_OUTPATIENT_CLINIC_OR_DEPARTMENT_OTHER): Payer: BC Managed Care – PPO | Admitting: Internal Medicine

## 2020-10-01 ENCOUNTER — Ambulatory Visit (INDEPENDENT_AMBULATORY_CARE_PROVIDER_SITE_OTHER): Payer: BC Managed Care – PPO | Admitting: Neurology

## 2020-10-01 DIAGNOSIS — G478 Other sleep disorders: Secondary | ICD-10-CM | POA: Diagnosis not present

## 2020-10-01 DIAGNOSIS — G7281 Critical illness myopathy: Secondary | ICD-10-CM

## 2020-10-01 DIAGNOSIS — L89893 Pressure ulcer of other site, stage 3: Secondary | ICD-10-CM | POA: Diagnosis not present

## 2020-10-01 DIAGNOSIS — U099 Post covid-19 condition, unspecified: Secondary | ICD-10-CM

## 2020-10-01 DIAGNOSIS — Z8616 Personal history of COVID-19: Secondary | ICD-10-CM | POA: Diagnosis not present

## 2020-10-01 DIAGNOSIS — I10 Essential (primary) hypertension: Secondary | ICD-10-CM | POA: Diagnosis not present

## 2020-10-01 DIAGNOSIS — R0609 Other forms of dyspnea: Secondary | ICD-10-CM

## 2020-10-01 DIAGNOSIS — M6281 Muscle weakness (generalized): Secondary | ICD-10-CM | POA: Diagnosis not present

## 2020-10-01 DIAGNOSIS — G6281 Critical illness polyneuropathy: Secondary | ICD-10-CM

## 2020-10-01 DIAGNOSIS — L89629 Pressure ulcer of left heel, unspecified stage: Secondary | ICD-10-CM | POA: Diagnosis not present

## 2020-10-01 DIAGNOSIS — M8610 Other acute osteomyelitis, unspecified site: Secondary | ICD-10-CM

## 2020-10-01 DIAGNOSIS — M86171 Other acute osteomyelitis, right ankle and foot: Secondary | ICD-10-CM | POA: Diagnosis not present

## 2020-10-01 DIAGNOSIS — L89890 Pressure ulcer of other site, unstageable: Secondary | ICD-10-CM

## 2020-10-01 NOTE — Progress Notes (Signed)
ZAIDEN, LUDLUM ([id number]) Visit Report for 10/01/2020 Arrival Information Details Patient Name: Date of Service: [name], [place] 10/01/2020 9:15 A M Medical Record Number: [id number] Patient Account Number: [id number] Date of Birth/Sex: Treating RN: 1976-01-07 (45 y.o. [name], [name] Primary Care Leslee Haueter: [name] Other Clinician: Referring Marrisa Kimber: Treating August Gosser/Extender: [name] in Treatment: 11 Visit Information History Since Last Visit Added or deleted any medications: No Patient Arrived: Ambulatory Any new allergies or adverse reactions: No Arrival Time: 09:26 Had a fall or experienced change in No Accompanied By: wife activities of daily living that may affect Transfer Assistance: None risk of falls: Patient Identification Verified: Yes Signs or symptoms of abuse/neglect since last visito No Secondary Verification Process Completed: Yes Hospitalized since last visit: No Patient Requires Transmission-Based Precautions: No Implantable device outside of the clinic excluding No Patient Has Alerts: No cellular tissue based products placed in the center since last visit: Has Dressing in Place as Prescribed: Yes Pain Present Now: Yes Electronic Signature(s) Signed: 10/01/2020 10:36:55 AM By: Sandre Kitty Entered By: Sandre Kitty on 10/01/2020 09:28:07 -------------------------------------------------------------------------------- Encounter Discharge Information Details Patient Name: Date of Service: [name], [place] E. 10/01/2020 9:15 A M Medical Record Number: [id number] Patient Account Number: [id number] Date of Birth/Sex: Treating RN: 01-Apr-1976 (45 y.o. [name] Primary Care Samit Sylve: [name] Other Clinician: Referring Vaanya Shambaugh: Treating Karalee Hauter/Extender: [name] in Treatment: 11 Encounter Discharge Information Items Post Procedure Vitals Discharge Condition: Stable Temperature (F):  98.1 Ambulatory Status: Wheelchair Pulse (bpm): 79 Discharge Destination: Home Respiratory Rate (breaths/min): 18 Transportation: Private Auto Blood Pressure (mmHg): 151/88 Accompanied By: wife Schedule Follow-up Appointment: Yes Clinical Summary of Care: Notes surgical shoes. Electronic Signature(s) Signed: 10/01/2020 5:39:27 PM By: [name] Entered By: [name] on 10/01/2020 12:19:14 -------------------------------------------------------------------------------- Lower Extremity Assessment Details Patient Name: Date of Service: [name] [employer] 10/01/2020 9:15 A M Medical Record Number: [id number] Patient Account Number: [id number] Date of Birth/Sex: Treating RN: 1975-11-05 (45 y.o. [name] Primary Care Deja Kaigler: [name] Other Clinician: Referring Tamey Wanek: Treating Keigen Caddell/Extender: [name] in Treatment: 11 Edema Assessment Assessed: [name]: Yes] [name]: Yes] Edema: [Left: Yes] [Right: Yes] Calf Left: Right: Point of Measurement: 34 cm From Medial Instep 42 cm 42 cm Ankle Left: Right: Point of Measurement: 12 cm From Medial Instep 24 cm 24 cm Vascular Assessment Pulses: Dorsalis Pedis Palpable: [Left:Yes] [Right:Yes] Electronic Signature(s) Signed: 10/01/2020 5:39:27 PM By: [name] Entered By: [name] on 10/01/2020 09:41:43 -------------------------------------------------------------------------------- Multi Wound Chart Details Patient Name: Date of Service: [name], [place] 10/01/2020 9:15 A M Medical Record Number: [id number] Patient Account Number: [id number] Date of Birth/Sex: Treating RN: 16-Dec-1975 (45 y.o. [name] Primary Care Khoury Siemon: [name] Other Clinician: Referring Rianna Lukes: Treating Gleason Ardoin/Extender: [name] in Treatment: 11 Vital Signs Height(in): 68 Pulse(bpm): 52 Weight(lbs): 280 Blood Pressure(mmHg): 151/88 Body Mass Index(BMI):  41 Temperature(F): 98.1 Respiratory Rate(breaths/min): 18 Photos: [1:No Photos Right Calcaneus] [2:No Photos Left Calcaneus] [N/A:N/A N/A] Wound Location: [1:Pressure Injury] [2:Pressure Injury] [N/A:N/A] Wounding Event: [1:Pressure Ulcer] [2:Pressure Ulcer] [N/A:N/A] Primary Etiology: [1:Asthma, Angina, Hypertension] [2:Asthma, Angina, Hypertension] [N/A:N/A] Comorbid History: [1:10/07/2019] [2:10/07/2019] [N/A:N/A] Date Acquired: [1:11] [2:11] [N/A:N/A] Weeks of Treatment: [1:Open] [2:Open] [N/A:N/A] Wound Status: [1:4.7x1.7x0.7] [2:5.5x2.2x0.5] [N/A:N/A] Measurements L x W x D (cm) [1:6.275] [2:9.503] [N/A:N/A] A (cm) : rea [1:4.393] [2:4.752] [N/A:N/A] Volume (cm) : [1:80.20%] [2:64.90%] [N/A:N/A] % Reduction in A rea: [1:86.10%] [2:82.50%] [N/A:N/A] % Reduction  in Volume: [1:Category/Stage III] [2:Category/Stage III] [N/A:N/A] Classification: [1:Medium] [2:Medium] [N/A:N/A] Exudate A mount: [1:Serosanguineous] [2:Serosanguineous] [N/A:N/A] Exudate Type: [1:red, brown] [2:red, brown] [N/A:N/A] Exudate Color: [1:Thickened] [2:Thickened] [N/A:N/A] Wound Margin: [1:Large (67-100%)] [2:Large (67-100%)] [N/A:N/A] Granulation A mount: [1:Pink, Pale] [2:Pink] [N/A:N/A] Granulation Quality: [1:Small (1-33%)] [2:Small (1-33%)] [N/A:N/A] Necrotic A mount: [1:Fat Layer (Subcutaneous Tissue): Yes Fat Layer (Subcutaneous Tissue): Yes N/A] Exposed Structures: [1:Fascia: No Tendon: No Muscle: No Joint: No Bone: No Small (1-33%)] [2:Fascia: No Tendon: No Muscle: No Joint: No Bone: No Small (1-33%)] [N/A:N/A] Epithelialization: [1:Cellular or Tissue Based Product] [2:Cellular or Tissue Based Product] [N/A:N/A] Treatment Notes Electronic Signature(s) Signed: 10/01/2020 5:09:37 PM By: Linton Ham MD Signed: 10/01/2020 5:39:27 PM By: [name] Entered By: Linton Ham on 10/01/2020 10:15:07 -------------------------------------------------------------------------------- Multi-Disciplinary  Care Plan Details Patient Name: Date of Service: [name], [name] 10/01/2020 9:15 A M Medical Record Number: [id number] Patient Account Number: [id number] Date of Birth/Sex: Treating RN: [birth year] (45 y.o. [name] Primary Care Soundra Lampley: [name] Other Clinician: Referring Shravya Wickwire: Treating Tanaiya Kolarik/Extender: [name] in Treatment: 11 Multidisciplinary Care Plan reviewed with physician Active Inactive Abuse / Safety / Falls / Self Care Management Nursing Diagnoses: Potential for falls Goals: Patient/caregiver will verbalize/demonstrate measures taken to prevent injury and/or falls Date Initiated: 07/15/2020 Target Resolution Date: 11/06/2020 Goal Status: Active Interventions: Assess fall risk on admission and as needed Assess impairment of mobility on admission and as needed per policy Notes: Wound/Skin Impairment Nursing Diagnoses: Impaired tissue integrity Knowledge deficit related to ulceration/compromised skin integrity Goals: Patient/caregiver will verbalize understanding of skin care regimen Date Initiated: 07/15/2020 Target Resolution Date: 12/04/2020 Goal Status: Active Ulcer/skin breakdown will have a volume reduction of 30% by week 4 Date Initiated: 07/15/2020 Date Inactivated: 08/20/2020 Target Resolution Date: 09/03/2020 Goal Status: Unmet Unmet Reason: no major changes. Interventions: Assess patient/caregiver ability to obtain necessary supplies Assess patient/caregiver ability to perform ulcer/skin care regimen upon admission and as needed Assess ulceration(s) every visit Provide education on ulcer and skin care Treatment Activities: Skin care regimen initiated : 07/15/2020 Topical wound management initiated : 07/15/2020 Notes: Electronic Signature(s) Signed: 10/01/2020 5:39:27 PM By: [name] Entered By: [name] on 10/01/2020  09:43:54 -------------------------------------------------------------------------------- Pain Assessment Details Patient Name: Date of Service: [name], [place] 10/01/2020 9:15 A M Medical Record Number: [id number] Patient Account Number: [id number] Date of Birth/Sex: Treating RN: Mar 14, 1976 (45 y.o. [name] Primary Care Myreon Wimer: [name] Other Clinician: Referring Efrat Zuidema: Treating Rylynn Kobs/Extender: [name] in Treatment: 11 Active Problems Location of Pain Severity and Description of Pain Patient Has Paino Yes Site Locations Rate the pain. Current Pain Level: 4 Pain Management and Medication Current Pain Management: Electronic Signature(s) Signed: 10/01/2020 10:36:55 AM By: Sandre Kitty Signed: 10/01/2020 5:39:27 PM By: [name] Entered By: Sandre Kitty on 10/01/2020 09:28:36 -------------------------------------------------------------------------------- Patient/Caregiver Education Details Patient Name: Date of Service: Ciro Backer 2/24/2022andnbsp9:15 [place] Record Number: [id number] Patient Account Number: [id number] Date of Birth/Gender: Treating RN: 06/05/1976 (45 y.o. [name] Primary Care Physician: [name] Other Clinician: Referring Physician: Treating Physician/Extender: [name] in Treatment: 11 Education Assessment Education Provided To: Patient Education Topics Provided Wound/Skin Impairment: Handouts: Skin Care Do's and Dont's Methods: Explain/Verbal Responses: Reinforcements needed Electronic Signature(s) Signed: 10/01/2020 5:39:27 PM By: [name] Entered By: [name] on 10/01/2020 09:44:11 -------------------------------------------------------------------------------- Wound Assessment Details Patient Name: Date of Service: [name], [place] 10/01/2020 9:15 A M Medical Record  Number: [id number] Patient Account Number: [id number] Date of  Birth/Sex: Treating RN: 1975-08-23 (45 y.o. [name], [name] Primary Care Nuriya Stuck: [name] Other Clinician: Referring Pollyann Roa: Treating Deeandra Jerry/Extender: [name] in Treatment: 11 Wound Status Wound Number: 1 Primary Etiology: Pressure Ulcer Wound Location: Right Calcaneus Wound Status: Open Wounding Event: Pressure Injury Comorbid History: Asthma, Angina, Hypertension Date Acquired: 10/07/2019 Weeks Of Treatment: 11 Clustered Wound: No Wound Measurements Length: (cm) 4.7 Width: (cm) 1.7 Depth: (cm) 0.7 Area: (cm) 6.275 Volume: (cm) 4.393 % Reduction in Area: 80.2% % Reduction in Volume: 86.1% Epithelialization: Small (1-33%) Tunneling: No Undermining: No Wound Description Classification: Category/Stage III Wound Margin: Thickened Exudate Amount: Medium Exudate Type: Serosanguineous Exudate Color: red, brown Foul Odor After Cleansing: No Slough/Fibrino Yes Wound Bed Granulation Amount: Large (67-100%) Exposed Structure Granulation Quality: Pink, Pale Fascia Exposed: No Necrotic Amount: Small (1-33%) Fat Layer (Subcutaneous Tissue) Exposed: Yes Necrotic Quality: Adherent Slough Tendon Exposed: No Muscle Exposed: No Joint Exposed: No Bone Exposed: No Treatment Notes Wound #1 (Calcaneus) Wound Laterality: Right Cleanser Peri-Wound Care Topical Primary Dressing Secondary Dressing Woven Gauze Sponge, Non-Sterile 4x4 in Discharge Instruction: ensure to fold and apply up to the Collagen to ensure it keeps the Collagen in place next to wound bed. Apply over primary dressing as directed. ABD Pad, 5x9 Discharge Instruction: Apply over primary dressing as directed. Secured With Elastic Bandage 4 inch (ACE bandage) Discharge Instruction: Secure with ACE bandage as directed. Kerlix Roll Sterile, 4.5x3.1 (in/yd) Discharge Instruction: Secure with Kerlix as directed. Paper Tape, 2x10 (in/yd) Discharge Instruction: Secure dressing with  tape as directed. Compression Wrap Compression Stockings Add-Ons Notes oasis trilayer applied by MD with saline moisten gauze packing the oasis secured with adaptic and steri-strips. Electronic Signature(s) Signed: 10/01/2020 5:39:27 PM By: [name] Entered By: [name] on 10/01/2020 09:42:08 -------------------------------------------------------------------------------- Wound Assessment Details Patient Name: Date of Service: [name], [place] 10/01/2020 9:15 A M Medical Record Number: [id number] Patient Account Number: [id number] Date of Birth/Sex: Treating RN: 1976/01/03 (45 y.o. [name], [name] Primary Care Jolanda Mccann: [name] Other Clinician: Referring Mckyle Solanki: Treating Cornelius Marullo/Extender: [name] in Treatment: 11 Wound Status Wound Number: 2 Primary Etiology: Pressure Ulcer Wound Location: Left Calcaneus Wound Status: Open Wounding Event: Pressure Injury Comorbid History: Asthma, Angina, Hypertension Date Acquired: 10/07/2019 Weeks Of Treatment: 11 Clustered Wound: No Wound Measurements Length: (cm) 5.5 Width: (cm) 2.2 Depth: (cm) 0.5 Area: (cm) 9.503 Volume: (cm) 4.752 % Reduction in Area: 64.9% % Reduction in Volume: 82.5% Epithelialization: Small (1-33%) Tunneling: No Undermining: No Wound Description Classification: Category/Stage III Wound Margin: Thickened Exudate Amount: Medium Exudate Type: Serosanguineous Exudate Color: red, brown Foul Odor After Cleansing: No Slough/Fibrino Yes Wound Bed Granulation Amount: Large (67-100%) Exposed Structure Granulation Quality: Pink Fascia Exposed: No Necrotic Amount: Small (1-33%) Fat Layer (Subcutaneous Tissue) Exposed: Yes Necrotic Quality: Adherent Slough Tendon Exposed: No Muscle Exposed: No Joint Exposed: No Bone Exposed: No Treatment Notes Wound #2 (Calcaneus) Wound Laterality: Left Cleanser Peri-Wound Care Topical Primary Dressing Secondary Dressing Woven  Gauze Sponge, Non-Sterile 4x4 in Discharge Instruction: ensure to fold and apply up to the Collagen to ensure it keeps the Collagen in place next to wound bed. Apply over primary dressing as directed. ABD Pad, 5x9 Discharge Instruction: Apply over primary dressing as directed. Secured With Elastic Bandage 4 inch (ACE bandage) Discharge Instruction: Secure with ACE bandage as directed. Kerlix Roll Sterile, 4.5x3.1 (in/yd) Discharge Instruction: Secure with Kerlix as directed. Paper Tape, 2x10 (in/yd) Discharge  Instruction: Secure dressing with tape as directed. Compression Wrap Compression Stockings Add-Ons Notes oasis trilayer applied by MD with saline moisten gauze packing the oasis secured with adaptic and steri-strips. Electronic Signature(s) Signed: 10/01/2020 5:39:27 PM By: [name] Entered By: [name] on 10/01/2020 09:42:38 -------------------------------------------------------------------------------- Vitals Details Patient Name: Date of Service: [name], [place] 10/01/2020 9:15 A M Medical Record Number: [id number] Patient Account Number: [id number] Date of Birth/Sex: Treating RN: 1976/02/27 (45 y.o. [name] Primary Care Candido Flott: [name] Other Clinician: Referring Maxon Kresse: Treating Savahna Casados/Extender: [name] in Treatment: 11 Vital Signs Time Taken: 09:28 Temperature (F): 98.1 Height (in): 69 Pulse (bpm): 79 Weight (lbs): 280 Respiratory Rate (breaths/min): 18 Body Mass Index (BMI): 41.3 Blood Pressure (mmHg): 151/88 Reference Range: 80 - 120 mg / dl Electronic Signature(s) Signed: 10/01/2020 10:36:55 AM By: Sandre Kitty Entered By: Sandre Kitty on 10/01/2020 44:96:75

## 2020-10-01 NOTE — Progress Notes (Signed)
[name], [name] ([id number]) Visit Report for 10/01/2020 Cellular or Tissue Based Product Details Patient Name: Date of Service: [name] [place] 10/01/2020 9:15 A M Medical Record Number: [id number] Patient Account Number: [id number] Date of Birth/Sex: Treating RN: 1976/06/09 (45 y.o. [name] Primary Care Provider: [name] Other Clinician: Referring Provider: Treating Provider/Extender: [name] in Treatment: 11 Cellular or Tissue Based Product Type Wound #1 Right Calcaneus Applied to: Performed By: Physician Ricard Dillon., MD Cellular or Tissue Based Product Type: Oasis tri-layer Level of Consciousness (Pre-procedure): Awake and Alert Pre-procedure Verification/Time Out Yes - 10:00 Taken: Location: genitalia / hands / feet / multiple digits Wound Size (sq cm): 7.99 Product Size (sq cm): 17.5 Waste Size (sq cm): 0 Amount of Product Applied (sq cm): 17.5 Instrument Used: Forceps, Scissors Lot #: [id number] Order #: 2 Expiration Date: 11/16/2021 Fenestrated: No Reconstituted: Yes Solution Type: normal saline Solution Amount: 32m Lot #: [id number] Expiration Date: 02/04/2022 Secured: Yes Secured With: Steri-Strips, adaptic Dressing Applied: No Procedural Pain: 0 Post Procedural Pain: 0 Response to Treatment: Procedure was tolerated well Level of Consciousness (Post- Awake and Alert procedure): Post Procedure Diagnosis Same as Pre-procedure Electronic Signature(s) Signed: 10/01/2020 5:09:37 PM By: RLinton HamMD Entered By: RLinton Hamon 10/01/2020 10:15:16 -------------------------------------------------------------------------------- Cellular or Tissue Based Product Details Patient Name: Date of Service: [name] 10/01/2020 9:15 A M Medical Record Number: [id number] Account Number: [id number] of Birth/Sex: Treating RN: 407-Jul-1977(45y.o. MHessie DienerPrimary Care Provider: WCristie HemOther Clinician: Referring Provider: Treating Provider/Extender: RShirleen Schirmerin Treatment: 11 Cellular or Tissue Based Product Type Wound #2 Left Calcaneus Applied to: Performed By: Physician RRicard Dillon, MD Cellular or Tissue Based Product Type: Oasis tri-layer Level of Consciousness (Pre-procedure): Awake and Alert Pre-procedure Verification/Time Out Yes - 10:00 Taken: Location: genitalia / hands / feet / multiple digits Wound Size (sq cm): 12.1 Product Size (sq cm): 17.5 Waste Size (sq cm): 0 Amount of Product Applied (sq cm): 17.5 Instrument Used: Forceps, Scissors Lot #: [id number] #: 2 Expiration Date: 11/16/2021 Fenestrated: No Reconstituted: Yes Solution Type: normal saline Solution Amount: 170mLot #: [id number] Expiration Date: 02/04/2022 Secured: Yes Secured With: Steri-Strips, adaptic Dressing Applied: No Procedural Pain: 0 Post Procedural Pain: 0 Response to Treatment: Procedure was tolerated well Level of Consciousness (Post- Awake and Alert procedure): Post Procedure Diagnosis Same as Pre-procedure Electronic Signature(s) Signed: 10/01/2020 5:09:37 PM By: RoLinton HamD Entered By: RoLinton Hamn 10/01/2020 10:15:24 -------------------------------------------------------------------------------- HPI Details Patient Name: Date of Service: G[place]agda [place]2/24/2022 9:15 A M Medical Record Number: [id number] Account Number: [id number] [id number] Birth/Sex: Treating RN: [birth year] (45o. M)Hessie Dienerrimary Care Provider: WiCristie Hemther Clinician: Referring Provider: Treating Provider/Extender: RoShirleen Schirmern Treatment: 11 History of Present Illness HPI Description: 07/15/2020 upon evaluation today patient presents for initial inspection here in our clinic concerning issues he has been having with the bottoms of his feet bilaterally. He states these actually  occurred as wounds when he was hospitalized for 5 months secondary to Covid. He was apparently with tilting bed where he was in an upright position quite frequently and apparently this occurred in some way shape or form during that time. Fortunately there is no sign of active infection at this time. No fevers, chills, nausea, vomiting, or diarrhea. With that being said he still has substantial wounds  on the plantar aspects of his feet Theragen require quite a bit of work to get these to heal. He has been using Santyl currently though that is been problematic both in receiving the medication as well as actually paid for it as it is become quite expensive. Prior to the experience with Covid the patient really did not have any major medical problems other than hypertension he does have some mild generalized weakness following the Covid experience. 07/22/2020 on evaluation today patient appears to be doing okay in regard to his foot ulcers I feel like the wound beds are showing signs of better improvement that I do believe the Iodoflex is helping in this regard. With that being said he does have a lot of drainage currently and this is somewhat blue/green in nature which is consistent with Pseudomonas. I do think a culture today would be appropriate for [place] to evaluate and see if that is indeed the case I would likely start him on antibiotic orally as well he is not allergic to Cipro knows of no issues he has had in the past 12/21; patient was admitted to the clinic earlier this month with bilateral presumed pressure ulcers on the bottom of his feet apparently related to excessive pressure from a tilt table arrangement in the intensive care unit. Patient relates this to being on ECMO but I am not really sure that is exactly related to that. I must say I have never seen anything like this. He has fairly extensive full-thickness wounds extending from his heel towards his midfoot mostly centered laterally. There  is already been some healing distally. He does not appear to have an arterial issue. He has been using gentamicin to the wound surfaces with Iodoflex to help with ongoing debridement 1/6; this is a patient with pressure ulcers on the bottom of his feet related to excessive pressure from a standing position in the intensive care unit. He is complaining of a lot of pain in the right heel. He is not a diabetic. 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 We have been using Iodoflex to help prepare the surfaces of both wounds for an advanced treatment product. He is nonambulatory spending most of his time in a wheelchair I have asked him not to propel the wheelchair with his heels 1/13; in general his wounds look better not much surface area change we have been using Iodoflex as of last week. I did an x-ray of the right heel as the patient was complaining of pain. I had some thoughts about a stress fracture perhaps Achilles tendon problems however what it showed was erosive changes along the inferior aspect of the calcaneus he now has a MRI booked for 1/20. 1/20; in general his wounds continue to be better. Some improvement in the large narrow areas proximally in his foot. He is still complaining of pain in the right heel and tenderness in certain areas of this wound. His MRI is tonight. I am not just looking for osteomyelitis that was brought up on the x-ray I am wondering about stress fractures, tendon ruptures etc. He has no such findings on the left. Also noteworthy is that the patient had critical illness neuropathy and some of the discomfort may be actual improvement in nerve function I am just not sure. These wounds were initially in the setting of severe critical illness related to COVID-19. He was put in a standing position. He may have also been on pressors at the point contributing to  tissue ischemia. By his description at some point these wounds were grossly necrotic  extending proximally up into the Achilles part of his heel. I do not know that I have ever really seen pictures of them like this although they may exist in epic We have ordered Tri layer Oasis. I am trying to stimulate some granulation in these areas. This is of course assuming the MRI is negative for infection 1/27; since the patient was last here he saw Dr. Juleen China of infectious disease. He is planned for vancomycin and ceftriaxone. Prior operative culture grew MSSA. Also ordered baseline lab work. He also ordered arterial studies although the ABIs in our clinic were normal as well as his clinical exam these were normal I do not think he needs to see vascular surgery. His ABIs at the PTA were 1.22 in the right triphasic waveforms with a normal TBI of 1.15 on the left ABI of 1.22 with triphasic waveforms and a normal TBI of 1.08. Finally he saw Dr. Amalia Hailey who will follow him in for 2 months. At this point I do not think he felt that he needed a procedure on the right calcaneal bone. Dr. Juleen China is elected for broad-spectrum antibiotic The patient is still having pain in the right heel. He walks with a walker 2/3; wounds are generally smaller. He is tolerating his IV antibiotics. I believe this is vancomycin and ceftriaxone. We are still waiting for Oasis burn in terms of his out-of-pocket max which he should be meeting soon given the IV antibiotics, MRIs etc. I have asked him to check in on this. We are using silver collagen in the meantime the wounds look better 2/10; tolerating IV vancomycin and Rocephin. We are waiting to apply for Oasis. Although I am not really sure where he is in his out-of-pocket max. 2/17 started the first application of Oasis trilayer. Still on antibiotics. The wounds have generally look better. The area on the left has a little more surface slough requiring debridement 0/93; second application of Oasis trilayer. The wound surface granulation is generally look better. The  area on the left with undermining laterally I think is come in a bit. Electronic Signature(s) Signed: 10/01/2020 5:09:37 PM By: Linton Ham MD Entered By: Linton Ham on 10/01/2020 10:16:01 -------------------------------------------------------------------------------- Physical Exam Details Patient Name: Date of Service: [name], [place] 10/01/2020 9:15 A M Medical Record Number: [id number] Patient Account Number: [id number] Date of Birth/Sex: Treating RN: [birth year] (45 y.o. [name] Primary Care Provider: [name] Other Clinician: Referring Provider: Treating Provider/Extender: [name] in Treatment: 11 Constitutional Patient is hypertensive.. Pulse regular and within target range for patient.Marland Kitchen Respirations regular, non-labored and within target range.. Temperature is normal and within the target range for the patient.Marland Kitchen Appears in no distress. Notes Wound exam; bilateral plantar heels. This wound actually extended into the midfoot. We have reapplied Oasis trilayer in the standard fashion back with normal saline and moist gauze. The surface granulation looks better here. No evidence of infection and think the granulation issue is the key here to try and fill in these areas Electronic Signature(s) Signed: 10/01/2020 5:09:37 PM By: Linton Ham MD Entered By: Linton Ham on 10/01/2020 10:17:22 -------------------------------------------------------------------------------- Physician Orders Details Patient Name: Date of Service: [name], [place] 10/01/2020 9:15 A M Medical Record Number: [id number] Patient Account Number: [id number] Date of Birth/Sex: Treating RN: Jun 28, 1976 (45 y.o. [name] Primary Care Provider: [name] Other Clinician: Referring Provider: Treating Provider/Extender: Dellia Nims  [place] S[place], Lucrezia [place] in Treatment: 11 Verbal / Phone Orders: No Diagnosis Coding Follow-up Appointments Return  Appointment in 1 week. Cellular or Tissue Based Products Wound #1 Right Calcaneus Cellular or Tissue Based Product Type: - Oasis Trilayer to both wounds #2. daptic or Mepitel. (DO NOT REMOVE). - Cellular or Tissue Based Product applied to wound bed, secured with steri-strips, cover with A ***apply saline moisten gauze under adaptic.*** Wound #2 Left Calcaneus Cellular or Tissue Based Product Type: - Oasis Trilayer to both wounds. daptic or Mepitel. (DO NOT REMOVE). - Cellular or Tissue Based Product applied to wound bed, secured with steri-strips, cover with A ***apply saline moisten gauze under adaptic.*** Bathing/ Shower/ Hygiene May shower and wash wound with soap and water. - wash wounds with dial antibacterial soap Edema Control - Lymphedema / SCD / Other Bilateral Lower Extremities Elevate legs to the level of the heart or above for 30 minutes daily and/or when sitting, a frequency of: Avoid standing for long periods of time. Off-Loading Other: - keep pressure off of the bottom of your feet Wound Treatment Wound #1 - Calcaneus Wound Laterality: Right Secondary Dressing: Woven Gauze Sponge, Non-Sterile 4x4 in (Generic) 3 x Per Week/30 Days Discharge Instructions: ensure to fold and apply up to the Collagen to ensure it keeps the Collagen in place next to wound bed. Apply over primary dressing as directed. Secondary Dressing: ABD Pad, 5x9 3 x Per Week/30 Days Discharge Instructions: Apply over primary dressing as directed. Secured With: Elastic Bandage 4 inch (ACE bandage) 3 x Per Week/30 Days Discharge Instructions: Secure with ACE bandage as directed. Secured With: [employer], 4.5x3.1 (in/yd) (Generic) 3 x Per Week/30 Days Discharge Instructions: Secure with Kerlix as directed. Secured With: Paper Tape, 2x10 (in/yd) (Generic) 3 x Per Week/30 Days Discharge Instructions: Secure dressing with tape as directed. Wound #2 - Calcaneus Wound Laterality: Left Secondary  Dressing: Woven Gauze Sponge, Non-Sterile 4x4 in (Generic) 3 x Per Week/30 Days Discharge Instructions: ensure to fold and apply up to the Collagen to ensure it keeps the Collagen in place next to wound bed. Apply over primary dressing as directed. Secondary Dressing: ABD Pad, 5x9 3 x Per Week/30 Days Discharge Instructions: Apply over primary dressing as directed. Secured With: Elastic Bandage 4 inch (ACE bandage) 3 x Per Week/30 Days Discharge Instructions: Secure with ACE bandage as directed. Secured With: [employer], 4.5x3.1 (in/yd) (Generic) 3 x Per Week/30 Days Discharge Instructions: Secure with Kerlix as directed. Secured With: Paper Tape, 2x10 (in/yd) (Generic) 3 x Per Week/30 Days Discharge Instructions: Secure dressing with tape as directed. Electronic Signature(s) Signed: 10/01/2020 5:09:37 PM By: Linton Ham MD Signed: 10/01/2020 5:39:27 PM By: [name] Entered By: [name] on 10/01/2020 10:05:45 -------------------------------------------------------------------------------- Problem List Details Patient Name: Date of Service: [name], [place]. 10/01/2020 9:15 A M Medical Record Number: [id number] Patient Account Number: [id number] Date of Birth/Sex: Treating RN: 09-22-1975 (45 y.o. [name] Primary Care Provider: [name] Other Clinician: Referring Provider: Treating Provider/Extender: [name] in Treatment: 11 Active Problems ICD-10 Encounter Code Description Active Date MDM Diagnosis 340 022 [id number] Pressure ulcer of other site, stage 3 07/15/2020 No Yes M62.81 Muscle weakness (generalized) 07/15/2020 No Yes I10 Essential (primary) hypertension 07/15/2020 No Yes L97.512 Non-pressure chronic ulcer of other part of right foot with fat layer exposed 09/03/2020 No Yes L97.522 Non-pressure chronic ulcer of other 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 09/03/2020 No Yes Inactive Problems ICD-10 Code Description  Active Date  Inactive Date M86.171 Other acute osteomyelitis, right ankle and foot 09/03/2020 09/03/2020 Resolved Problems Electronic Signature(s) Signed: 10/01/2020 5:09:37 PM By: Linton Ham MD Entered By: Linton Ham on 10/01/2020 10:15:00 -------------------------------------------------------------------------------- Progress Note Details Patient Name: Date of Service: [name], [place] 10/01/2020 9:15 A M Medical Record Number: [id number] Patient Account Number: [id number] Date of Birth/Sex: Treating RN: [birth year] (45 y.o. [name] Primary Care Provider: [name] Other Clinician: Referring Provider: Treating Provider/Extender: [name] in Treatment: 11 Subjective History of Present Illness (HPI) 07/15/2020 upon evaluation today patient presents for initial inspection here in our clinic concerning issues he has been having with the bottoms of his feet bilaterally. He states these actually occurred as wounds when he was hospitalized for 5 months secondary to Covid. He was apparently with tilting bed where he was in an upright position quite frequently and apparently this occurred in some way shape or form during that time. Fortunately there is no sign of active infection at this time. No fevers, chills, nausea, vomiting, or diarrhea. With that being said he still has substantial wounds on the plantar aspects of his feet Theragen require quite a bit of work to get these to heal. He has been using Santyl currently though that is been problematic both in receiving the medication as well as actually paid for it as it is become quite expensive. Prior to the experience with Covid the patient really did not have any major medical problems other than hypertension he does have some mild generalized weakness following the Covid experience. 07/22/2020 on evaluation today patient appears to be doing okay in regard to his foot ulcers I feel like the wound beds are  showing signs of better improvement that I do believe the Iodoflex is helping in this regard. With that being said he does have a lot of drainage currently and this is somewhat blue/green in nature which is consistent with Pseudomonas. I do think a culture today would be appropriate for [place] to evaluate and see if that is indeed the case I would likely start him on antibiotic orally as well he is not allergic to Cipro knows of no issues he has had in the past 12/21; patient was admitted to the clinic earlier this month with bilateral presumed pressure ulcers on the bottom of his feet apparently related to excessive pressure from a tilt table arrangement in the intensive care unit. Patient relates this to being on ECMO but I am not really sure that is exactly related to that. I must say I have never seen anything like this. He has fairly extensive full-thickness wounds extending from his heel towards his midfoot mostly centered laterally. There is already been some healing distally. He does not appear to have an arterial issue. He has been using gentamicin to the wound surfaces with Iodoflex to help with ongoing debridement 1/6; this is a patient with pressure ulcers on the bottom of his feet related to excessive pressure from a standing position in the intensive care unit. He is complaining of a lot of pain in the right heel. He is not a diabetic. 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 We have been using Iodoflex to help prepare the surfaces of both wounds for an advanced treatment product. He is nonambulatory spending most of his time in a wheelchair I have asked him not to propel the wheelchair with his heels 1/13; in general his wounds look better not much  surface area change we have been using Iodoflex as of last week. I did an x-ray of the right heel as the patient was complaining of pain. I had some thoughts about a stress fracture perhaps Achilles tendon problems  however what it showed was erosive changes along the inferior aspect of the calcaneus he now has a MRI booked for 1/20. 1/20; in general his wounds continue to be better. Some improvement in the large narrow areas proximally in his foot. He is still complaining of pain in the right heel and tenderness in certain areas of this wound. His MRI is tonight. I am not just looking for osteomyelitis that was brought up on the x-ray I am wondering about stress fractures, tendon ruptures etc. He has no such findings on the left. Also noteworthy is that the patient had critical illness neuropathy and some of the discomfort may be actual improvement in nerve function I am just not sure. These wounds were initially in the setting of severe critical illness related to COVID-19. He was put in a standing position. He may have also been on pressors at the point contributing to tissue ischemia. By his description at some point these wounds were grossly necrotic extending proximally up into the Achilles part of his heel. I do not know that I have ever really seen pictures of them like this although they may exist in epic We have ordered Tri layer Oasis. I am trying to stimulate some granulation in these areas. This is of course assuming the MRI is negative for infection 1/27; since the patient was last here he saw Dr. Juleen China of infectious disease. He is planned for vancomycin and ceftriaxone. Prior operative culture grew MSSA. Also ordered baseline lab work. He also ordered arterial studies although the ABIs in our clinic were normal as well as his clinical exam these were normal I do not think he needs to see vascular surgery. His ABIs at the PTA were 1.22 in the right triphasic waveforms with a normal TBI of 1.15 on the left ABI of 1.22 with triphasic waveforms and a normal TBI of 1.08. Finally he saw Dr. Amalia Hailey who will follow him in for 2 months. At this point I do not think he felt that he needed a procedure on the  right calcaneal bone. Dr. Juleen China is elected for broad-spectrum antibiotic The patient is still having pain in the right heel. He walks with a walker 2/3; wounds are generally smaller. He is tolerating his IV antibiotics. I believe this is vancomycin and ceftriaxone. We are still waiting for Oasis burn in terms of his out-of-pocket max which he should be meeting soon given the IV antibiotics, MRIs etc. I have asked him to check in on this. We are using silver collagen in the meantime the wounds look better 2/10; tolerating IV vancomycin and Rocephin. We are waiting to apply for Oasis. Although I am not really sure where he is in his out-of-pocket max. 2/17 started the first application of Oasis trilayer. Still on antibiotics. The wounds have generally look better. The area on the left has a little more surface slough requiring debridement 7/67; second application of Oasis trilayer. The wound surface granulation is generally look better. The area on the left with undermining laterally I think is come in a bit. Objective Constitutional Patient is hypertensive.. Pulse regular and within target range for patient.Marland Kitchen Respirations regular, non-labored and within target range.. Temperature is normal and within the target range for the patient.Marland Kitchen Appears in no  distress. Vitals Time Taken: 9:28 AM, Height: 69 in, Weight: 280 lbs, BMI: 41.3, Temperature: 98.1 F, Pulse: 79 bpm, Respiratory Rate: 18 breaths/min, Blood Pressure: 151/88 mmHg. General Notes: Wound exam; bilateral plantar heels. This wound actually extended into the midfoot. We have reapplied Oasis trilayer in the standard fashion back with normal saline and moist gauze. The surface granulation looks better here. No evidence of infection and think the granulation issue is the key here to try and fill in these areas Integumentary (Hair, Skin) Wound #1 status is Open. Original cause of wound was Pressure Injury. The date acquired was: 10/07/2019.  The wound has been in treatment 11 weeks. The wound is located on the Right Calcaneus. The wound measures 4.7cm length x 1.7cm width x 0.7cm depth; 6.275cm^2 area and 4.393cm^3 volume. There is Fat Layer (Subcutaneous Tissue) exposed. There is no tunneling or undermining noted. There is a medium amount of serosanguineous drainage noted. The wound margin is thickened. There is large (67-100%) pink, pale granulation within the wound bed. There is a small (1-33%) amount of necrotic tissue within the wound bed including Adherent Slough. Wound #2 status is Open. Original cause of wound was Pressure Injury. The date acquired was: 10/07/2019. The wound has been in treatment 11 weeks. The wound is located on the Left Calcaneus. The wound measures 5.5cm length x 2.2cm width x 0.5cm depth; 9.503cm^2 area and 4.752cm^3 volume. There is Fat Layer (Subcutaneous Tissue) exposed. There is no tunneling or undermining noted. There is a medium amount of serosanguineous drainage noted. The wound margin is thickened. There is large (67-100%) pink granulation within the wound bed. There is a small (1-33%) amount of necrotic tissue within the wound bed including Adherent Slough. Assessment Active Problems ICD-10 Pressure ulcer of other site, stage 3 Muscle weakness (generalized) Essential (primary) hypertension Non-pressure chronic ulcer of other part of right foot with fat layer exposed Non-pressure chronic ulcer of other 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 Procedures Wound #1 Pre-procedure diagnosis of Wound #1 is a Pressure Ulcer located on the Right Calcaneus. A skin graft procedure using a bioengineered skin substitute/cellular or tissue based product was performed by Ricard Dillon., MD with the following instrument(s): Forceps and Scissors. Oasis tri-layer was applied and secured with Steri-Strips and adaptic. 17.5 sq cm of product was utilized and 0 sq cm was wasted. Post Application, no dressing was  applied. A Time Out was conducted at 10:00, prior to the start of the procedure. The procedure was tolerated well with a pain level of 0 throughout and a pain level of 0 following the procedure. Post procedure Diagnosis Wound #1: Same as Pre-Procedure . Wound #2 Pre-procedure diagnosis of Wound #2 is a Pressure Ulcer located on the Left Calcaneus. A skin graft procedure using a bioengineered skin substitute/cellular or tissue based product was performed by Ricard Dillon., MD with the following instrument(s): Forceps and Scissors. Oasis tri-layer was applied and secured with Steri-Strips and adaptic. 17.5 sq cm of product was utilized and 0 sq cm was wasted. Post Application, no dressing was applied. A Time Out was conducted at 10:00, prior to the start of the procedure. The procedure was tolerated well with a pain level of 0 throughout and a pain level of 0 following the procedure. Post procedure Diagnosis Wound #2: Same as Pre-Procedure . Plan Follow-up Appointments: Return Appointment in 1 week. Cellular or Tissue Based Products: Wound #1 Right Calcaneus: Cellular or Tissue Based Product Type: - Oasis Trilayer to both  wounds #2. Cellular or Tissue Based Product applied to wound bed, secured with steri-strips, cover with Adaptic or Mepitel. (DO NOT REMOVE). - ***apply saline moisten gauze under adaptic.*** Wound #2 Left Calcaneus: Cellular or Tissue Based Product Type: - Oasis Trilayer to both wounds. Cellular or Tissue Based Product applied to wound bed, secured with steri-strips, cover with Adaptic or Mepitel. (DO NOT REMOVE). - ***apply saline moisten gauze under adaptic.*** Bathing/ Shower/ Hygiene: May shower and wash wound with soap and water. - wash wounds with dial antibacterial soap Edema Control - Lymphedema / SCD / Other: Elevate legs to the level of the heart or above for 30 minutes daily and/or when sitting, a frequency of: Avoid standing for long periods of  time. Off-Loading: Other: - keep pressure off of the bottom of your feet WOUND #1: - Calcaneus Wound Laterality: Right Secondary Dressing: Woven Gauze Sponge, Non-Sterile 4x4 in (Generic) 3 x Per Week/30 Days Discharge Instructions: ensure to fold and apply up to the Collagen to ensure it keeps the Collagen in place next to wound bed. Apply over primary dressing as directed. Secondary Dressing: ABD Pad, 5x9 3 x Per Week/30 Days Discharge Instructions: Apply over primary dressing as directed. Secured With: Elastic Bandage 4 inch (ACE bandage) 3 x Per Week/30 Days Discharge Instructions: Secure with ACE bandage as directed. Secured With: [employer], 4.5x3.1 (in/yd) (Generic) 3 x Per Week/30 Days Discharge Instructions: Secure with Kerlix as directed. Secured With: Paper T ape, 2x10 (in/yd) (Generic) 3 x Per Week/30 Days Discharge Instructions: Secure dressing with tape as directed. WOUND #2: - Calcaneus Wound Laterality: Left Secondary Dressing: Woven Gauze Sponge, Non-Sterile 4x4 in (Generic) 3 x Per Week/30 Days Discharge Instructions: ensure to fold and apply up to the Collagen to ensure it keeps the Collagen in place next to wound bed. Apply over primary dressing as directed. Secondary Dressing: ABD Pad, 5x9 3 x Per Week/30 Days Discharge Instructions: Apply over primary dressing as directed. Secured With: Elastic Bandage 4 inch (ACE bandage) 3 x Per Week/30 Days Discharge Instructions: Secure with ACE bandage as directed. Secured With: [employer], 4.5x3.1 (in/yd) (Generic) 3 x Per Week/30 Days Discharge Instructions: Secure with Kerlix as directed. Secured With: Paper T ape, 2x10 (in/yd) (Generic) 3 x Per Week/30 Days Discharge Instructions: Secure dressing with tape as directed. 1. Oasis trilayer #2 2. Still offloading his feet as best he can. 3. The goal is to try and get this area to granulate 4. We already had a fair amount of healing and the more narrow linear  area towards the midfoot Electronic Signature(s) Signed: 10/01/2020 5:09:37 PM By: Linton Ham MD Entered By: Linton Ham on 10/01/2020 10:18:12 -------------------------------------------------------------------------------- SuperBill Details Patient Name: Date of Service: [name], [place]. 10/01/2020 Medical Record Number: [id number] Patient Account Number: [id number] Date of Birth/Sex: Treating RN: 07-06-1976 (45 y.o. [name], [name] Primary Care Provider: [name] Other Clinician: Referring Provider: Treating Provider/Extender: [name] in Treatment: 11 Diagnosis Coding ICD-10 Codes Code Description [phone] Pressure ulcer of other site, stage 3 M62.81 Muscle weakness (generalized) I10 Essential (primary) hypertension L97.512 Non-pressure chronic ulcer of other part of right foot with fat layer exposed L97.522 Non-pressure chronic ulcer of other 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 Facility Procedures CPT4 Code: [id number] Description: Q4124 Oasis Ultra per sq CM Modifier: Quantity: 5 CPT4 Code: [id number] Description: 94765 - SKIN SUB GRAFT FACE/NK/HF/G ICD-10 Diagnosis Description L97.512 Non-pressure chronic ulcer of other part of right foot  with fat layer exposed L97.522 Non-pressure chronic ulcer of other 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 Modifier: Quantity: 1 Physician Procedures : CPT4 Code Description Modifier [id number] - WC PHYS SKIN SUB GRAFT FACE/NK/HF/G ICD-10 Diagnosis Description L97.512 Non-pressure chronic ulcer of other part of right foot with fat layer exposed L97.522 Non-pressure chronic ulcer of other part of  left foot with fat layer exposed Quantity: 1 Electronic Signature(s) Signed: 10/01/2020 5:09:37 PM By: Linton Ham MD Entered By: Linton Ham on 10/01/2020 10:18:26

## 2020-10-04 DIAGNOSIS — M86171 Other acute osteomyelitis, right ankle and foot: Secondary | ICD-10-CM | POA: Diagnosis not present

## 2020-10-04 DIAGNOSIS — L03115 Cellulitis of right lower limb: Secondary | ICD-10-CM | POA: Diagnosis not present

## 2020-10-05 NOTE — Addendum Note (Signed)
Addended by: Larey Seat on: 10/05/2020 11:36 AM   Modules accepted: Orders

## 2020-10-05 NOTE — Progress Notes (Signed)
IMPRESSION:  1. There was no Obstructive or Central Sleep Apnea present (!). notable was a prolonged sleep latency and inability to initiate sleep because of low oxygen saturations during sleep the oxygen flow was adjusted to 2 liters and the patient was able to rest much better, when oxygen was turned off at 5.08 AM the patient continued to sleep but desaturated to 80% at nadir and remained hypoxic for over 10 minutes.  2. Primary Snoring 3. Severe Sleep Hypoxemia.    RECOMMENDATIONS: Oxygen dependent post-covid patient with immediate need for liter oxygen via nasal canula. I will order 2 liter oxygen for home use.    PS : I attached screen shots which are documenting the prolonged persistent hypoxia.

## 2020-10-05 NOTE — Procedures (Signed)
PATIENT'S NAME:  [name], [name] DOB:      1975/09/07      MR#:    [id number]     DATE OF RECORDING: 10/01/2020 REFERRING M.D.:  Lenor Coffin, MD, and [name], MD Study Performed:   Baseline Polysomnogram in Room 2, hospital bed, with OXYGEN titration.  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 Here upon consultation requested by primary MD on 09/15/2020  Chief concern according to patient: The patient presents today in a wheelchair but he assured me that he is able to transfer Dr. Tobey Grim physical exam notes are here and his last encounter with the patient was on 07-29-2020.  The patient's edema and his sores have been healing.  He received a prescription for a rolling walker by Dr. Jannifer Franklin.  He was encouraged to do strengthening exercises.  He does have difficulty with focusing which is a common post Covid finding fatigue and daytime drowsiness, but he also has trouble breathing well while sleeping- he likely has had this condition for a few years, related to obesity.    [name] has a past medical history of Long term Covid, long hauler- critical illness myopathy, neuropathy, orthopnea. Anginal pain ([place]), Anxiety, Asthma, Dyspnea, GERD (gastroesophageal reflux disease), Headache, History of kidney stones, HTN (hypertension), Pancreatitis (2018), MORBID OBESITY grade 3, and COVID related ICU stay with acute respiratory failure, 2 months ICU- stay(All related to COVID 19 respiratory failure)  (07/2019). Hospital stay was followed by 3 months rehabilitation.   The patient endorsed the Epworth Sleepiness Scale at 8-12 points. FSS at 40 points. Asthma, Anosmia and [place], orthopnea.   The patient's weight 296 pounds with a height of 68 (inches), resulting in a BMI of 44.3 kg/m2, at risk for hypoventilation. The patient's neck circumference measured 21 inches.  CURRENT  MEDICATIONS: ProAir, Norvasc, Symbicort, Rocephin, Cubicin, Cymbalta, Allegra, Norco, Lopressor, Singulair, Protonix, Desyrel, Nasacort   BASELINE STUDY: Lights Out was at 22:00 and Lights On at 05:21.  Total recording time (TRT) was 441.5 minutes, with a total sleep time (TST) of 123.5 minutes.  The patient's sleep latency was 325.5 minutes.  REM latency was 0 minutes.  The sleep efficiency was extremely poor at only 28.0 %.     SLEEP ARCHITECTURE: WASO (Wake after sleep onset) was 2.5 minutes.  There were 2.5 minutes in Stage N1, 121 minutes Stage N2, 0 minutes Stage N3 and 0 minutes in Stage REM.  The percentage of Stage N1 was 2.%, Stage N2 was 98.%, Stage N3 was 0% and Stage R (REM sleep) was 0%.   RESPIRATORY ANALYSIS:  There were a total of 0 respiratory events:     The total APNEA/HYPOPNEA INDEX (AHI) was 0/hour and the total RESPIRATORY DISTURBANCE INDEX was  0 /hour. The patient spent 0 minutes of total sleep time in the supine reclined position and 124 minutes in non-supine. The supine AHI was 0.0 versus a non-supine AHI of 0.0.  OXYGEN SATURATION & C02:  The Wake baseline 02 saturation was 87%, with the lowest being 80%. Time spent below 89% saturation equaled 10 minutes. The patient sat in bed, was unable to tolerate sleeping flat, respiratory discomfort was evident. The technician-initiated Oxygen supplementation started at 1 liter and went to 2 l. .   PERIODIC LIMB MOVEMENTS:  The patient had a total of 0 Periodic Limb Movements.   Audio  and video analysis did not show any abnormal or unusual movements, behaviors, phonations or vocalizations.  The patient had great difficulties to sleep and reports ongoing pain.  Snoring was noted. EKG was in keeping with normal sinus rhythm (NSR).  IMPRESSION:  1. There was no Obstructive or Central Sleep Apnea present (!). notable was a prolonged sleep latency and inability to initiate sleep because of low oxygen saturations during sleep the oxygen  flow was adjusted to 2 liters and the patient was able to rest much better, when oxygen was turned off at 5.08 AM the patient continued to sleep but desaturated to 80% at nadir and remained hypoxic for over 10 minutes.  2. Primary Snoring 3. Severe Sleep Hypoxemia.    RECOMMENDATIONS: Oxygen dependent post-covid patient with immediate need for liter oxygen via nasal canula. I will order 2 liter oxygen for home use.    I certify that I have reviewed the entire raw data recording prior to the issuance of this report in accordance with the Standards of Accreditation of the American Academy of Sleep Medicine (AASM)    Larey Seat, MD Diplomat, American Board of Psychiatry and Neurology  Diplomat, American Board of Sleep Medicine Market researcher, [facility] [employer]

## 2020-10-06 ENCOUNTER — Other Ambulatory Visit: Payer: Self-pay

## 2020-10-06 ENCOUNTER — Telehealth: Payer: Self-pay | Admitting: Neurology

## 2020-10-06 DIAGNOSIS — G4734 Idiopathic sleep related nonobstructive alveolar hypoventilation: Secondary | ICD-10-CM

## 2020-10-06 NOTE — Progress Notes (Signed)
Cherina, can you assist with DME order for nocturnal oxygen for documented nocturnal hypoxemia?  Thanks!

## 2020-10-06 NOTE — Telephone Encounter (Signed)
-----   Message from Larey Seat, MD sent at 10/05/2020 11:36 AM EST ----- IMPRESSION:  1. There was no Obstructive or Central Sleep Apnea present (!). notable was a prolonged sleep latency and inability to initiate sleep because of low oxygen saturations during sleep the oxygen flow was adjusted to 2 liters and the patient was able to rest much better, when oxygen was turned off at 5.08 AM the patient continued to sleep but desaturated to 80% at nadir and remained hypoxic for over 10 minutes.  2. Primary Snoring 3. Severe Sleep Hypoxemia.    RECOMMENDATIONS: Oxygen dependent post-covid patient with immediate need for liter oxygen via nasal canula. I will order 2 liter oxygen for home use.    PS : I attached screen shots which are documenting the prolonged persistent hypoxia.

## 2020-10-06 NOTE — Telephone Encounter (Signed)
Called the patient and reviewed the sleep study in detail. Advised there was no evidence of sleep apnea or restlessness in extremities.  Informed his heart rate was any normal sinus rhythm with no concerns.  Advised the only concern was his oxygen level would drop as low as 80%.  Patient had difficulty with his breathing and not able to sleep well due to this.  The sleep technician placed the patient on 2 L of oxygen and the patient was able to tolerate this and sleep well.  Dr. Edwena Felty recommendation is the patient wears 2 L of oxygen at nighttime.  Due to insurance requirements, given he is not using a CPAP machine for [place] to order oxygen to be bled into the machine, we will not be able to prescribe this for him.  Patient would need to follow with pulmonology and they would hopefully be able to get oxygen approved for him.  Advised I would send a copy of the sleep study to his pulmonologist and primary care physician.  Patient verbalized understanding.  Patient asked a copy of the sleep study be sent to his address on file.  Patient was made aware to contact his pulmonologist to establish an appointment to discuss oxygen being ordered.  He was appreciative for the call and had no further questions.

## 2020-10-07 ENCOUNTER — Encounter (HOSPITAL_BASED_OUTPATIENT_CLINIC_OR_DEPARTMENT_OTHER): Payer: BC Managed Care – PPO | Admitting: Physician Assistant

## 2020-10-07 DIAGNOSIS — G4734 Idiopathic sleep related nonobstructive alveolar hypoventilation: Secondary | ICD-10-CM | POA: Diagnosis not present

## 2020-10-07 DIAGNOSIS — U071 COVID-19: Secondary | ICD-10-CM | POA: Diagnosis not present

## 2020-10-07 DIAGNOSIS — L03115 Cellulitis of right lower limb: Secondary | ICD-10-CM | POA: Diagnosis not present

## 2020-10-07 DIAGNOSIS — M86171 Other acute osteomyelitis, right ankle and foot: Secondary | ICD-10-CM | POA: Diagnosis not present

## 2020-10-08 ENCOUNTER — Encounter (HOSPITAL_BASED_OUTPATIENT_CLINIC_OR_DEPARTMENT_OTHER): Payer: BC Managed Care – PPO | Admitting: Internal Medicine

## 2020-10-08 ENCOUNTER — Encounter (HOSPITAL_BASED_OUTPATIENT_CLINIC_OR_DEPARTMENT_OTHER): Payer: BC Managed Care – PPO | Attending: Internal Medicine | Admitting: Physician Assistant

## 2020-10-08 ENCOUNTER — Other Ambulatory Visit: Payer: Self-pay

## 2020-10-08 DIAGNOSIS — L89629 Pressure ulcer of left heel, unspecified stage: Secondary | ICD-10-CM | POA: Diagnosis not present

## 2020-10-08 DIAGNOSIS — L89619 Pressure ulcer of right heel, unspecified stage: Secondary | ICD-10-CM | POA: Diagnosis not present

## 2020-10-08 DIAGNOSIS — L89893 Pressure ulcer of other site, stage 3: Secondary | ICD-10-CM | POA: Diagnosis not present

## 2020-10-08 DIAGNOSIS — Z8616 Personal history of COVID-19: Secondary | ICD-10-CM | POA: Diagnosis not present

## 2020-10-08 NOTE — Progress Notes (Addendum)
EDVIN, [name] ([id number]) Visit Report for 10/08/2020 Chief Complaint Document Details Patient Name: Date of Service: [name], [place]. 10/08/2020 2:15 PM Medical Record Number: [id number] Patient Account Number: [id number] Date of Birth/Sex: Treating RN: 31-May-1976 (45 y.o. [name] Primary Care Provider: [name] Other Clinician: Referring Provider: Treating Provider/Extender: Tilda Franco in Treatment: 12 Information Obtained from: Patient Chief Complaint Bilateral Plantar Foot Ulcers Electronic Signature(s) Signed: 10/08/2020 2:49:41 PM By: Worthy Keeler PA-C Entered By: Worthy Keeler on 10/08/2020 14:49:40 -------------------------------------------------------------------------------- Cellular or Tissue Based Product Details Patient Name: Date of Service: [name], [place]. 10/08/2020 2:15 PM Medical Record Number: [id number] Patient Account Number: [id number] Date of Birth/Sex: Treating RN: 05/23/1976 (45 y.o. [name] Primary Care Provider: [name] Other Clinician: Referring Provider: Treating Provider/Extender: Tilda Franco in Treatment: 12 Cellular or Tissue Based Product Type Wound #1 Right Calcaneus Applied to: Performed By: Physician Worthy Keeler, PA Cellular or Tissue Based Product Type: Oasis tri-layer Level of Consciousness (Pre-procedure): Awake and Alert Pre-procedure Verification/Time Out Yes - 15:35 Taken: Location: genitalia / hands / feet / multiple digits Wound Size (sq cm): 6.16 Product Size (sq cm): 17.5 Waste Size (sq cm): 0 Amount of Product Applied (sq cm): 17.5 Instrument Used: Forceps, Scissors Lot #: [id number] Order #: 3 Expiration Date: 11/16/2021 Fenestrated: No Reconstituted: Yes Solution Type: normal saline Solution Amount: 52m Lot #: [id number] Expiration Date: 02/04/2022 Secured: Yes Secured With: Steri-Strips, adaptic Dressing Applied: No Procedural  Pain: 0 Post Procedural Pain: 0 Response to Treatment: Procedure was tolerated well Level of Consciousness (Post- Awake and Alert procedure): Post Procedure Diagnosis Same as Pre-procedure Electronic Signature(s) Signed: 10/08/2020 5:31:07 PM By: SWorthy KeelerPA-C Signed: 10/08/2020 5:49:32 PM By: DDeon PillingEntered By: DDeon Pillingon 10/08/2020 15:39:04 -------------------------------------------------------------------------------- Cellular or Tissue Based Product Details Patient Name: Date of Service: GMagda [name] [place] 10/08/2020 2:15 PM Medical Record Number: [id number] Account Number: [id number] of Birth/Sex: Treating RN: 402/18/1977(45y.o. MHessie DienerPrimary Care Provider: WCristie HemOther Clinician: Referring Provider: Treating Provider/Extender: STilda Francoin Treatment: 12 Cellular or Tissue Based Product Type Wound #2 Left Calcaneus Applied to: Performed By: Physician SWorthy Keeler PA Cellular or Tissue Based Product Type: Oasis tri-layer Level of Consciousness (Pre-procedure): Awake and Alert Pre-procedure Verification/Time Out Yes - 15:35 Taken: Location: genitalia / hands / feet / multiple digits Wound Size (sq cm): 11 Product Size (sq cm): 17.5 Waste Size (sq cm): 0 Amount of Product Applied (sq cm): 17.5 Instrument Used: Forceps, Scissors Lot #: [id number] #: 3 Expiration Date: 11/16/2021 Fenestrated: No Reconstituted: Yes Solution Type: normal saline Solution Amount: 181mLot #: [id number] Expiration Date: 02/04/2022 Secured: Yes Secured With: Steri-Strips, adaptic Dressing Applied: No Procedural Pain: 0 Post Procedural Pain: 0 Response to Treatment: Procedure was tolerated well Level of Consciousness (Post- Awake and Alert procedure): Post Procedure Diagnosis Same as Pre-procedure Electronic Signature(s) Signed: 10/08/2020 5:31:07 PM By: StWorthy KeelerA-C Signed: 10/08/2020 5:49:32 PM  By: DeDeon Pillingntered By: DeDeon Pillingn 10/08/2020 15:39:20 -------------------------------------------------------------------------------- HPI Details Patient Name: Date of Service: GOMagda BernheimTOScreven3/10/2020 2:15 PM Medical Record Number: [id number] Account Number: [id number] [id number] Birth/Sex: Treating RN: [birth year].o. M)[name] Dienerrimary Care Provider: WiCristie Hemther Clinician: Referring Provider: Treating Provider/Extender: StTilda Francon Treatment: 12 History of  Present Illness HPI Description: 07/15/2020 upon evaluation today patient presents for initial inspection here in our clinic concerning issues he has been having with the bottoms of his feet bilaterally. He states these actually occurred as wounds when he was hospitalized for 5 months secondary to Covid. He was apparently with tilting bed where he was in an upright position quite frequently and apparently this occurred in some way shape or form during that time. Fortunately there is no sign of active infection at this time. No fevers, chills, nausea, vomiting, or diarrhea. With that being said he still has substantial wounds on the plantar aspects of his feet Theragen require quite a bit of work to get these to heal. He has been using Santyl currently though that is been problematic both in receiving the medication as well as actually paid for it as it is become quite expensive. Prior to the experience with Covid the patient really did not have any major medical problems other than hypertension he does have some mild generalized weakness following the Covid experience. 07/22/2020 on evaluation today patient appears to be doing okay in regard to his foot ulcers I feel like the wound beds are showing signs of better improvement that I do believe the Iodoflex is helping in this regard. With that being said he does have a lot of drainage currently and this is somewhat blue/green in  nature which is consistent with Pseudomonas. I do think a culture today would be appropriate for [place] to evaluate and see if that is indeed the case I would likely start him on antibiotic orally as well he is not allergic to Cipro knows of no issues he has had in the past 12/21; patient was admitted to the clinic earlier this month with bilateral presumed pressure ulcers on the bottom of his feet apparently related to excessive pressure from a tilt table arrangement in the intensive care unit. Patient relates this to being on ECMO but I am not really sure that is exactly related to that. I must say I have never seen anything like this. He has fairly extensive full-thickness wounds extending from his heel towards his midfoot mostly centered laterally. There is already been some healing distally. He does not appear to have an arterial issue. He has been using gentamicin to the wound surfaces with Iodoflex to help with ongoing debridement 1/6; this is a patient with pressure ulcers on the bottom of his feet related to excessive pressure from a standing position in the intensive care unit. He is complaining of a lot of pain in the right heel. He is not a diabetic. 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 We have been using Iodoflex to help prepare the surfaces of both wounds for an advanced treatment product. He is nonambulatory spending most of his time in a wheelchair I have asked him not to propel the wheelchair with his heels 1/13; in general his wounds look better not much surface area change we have been using Iodoflex as of last week. I did an x-ray of the right heel as the patient was complaining of pain. I had some thoughts about a stress fracture perhaps Achilles tendon problems however what it showed was erosive changes along the inferior aspect of the calcaneus he now has a MRI booked for 1/20. 1/20; in general his wounds continue to be better. Some improvement in the  large narrow areas proximally in his foot. He is still complaining of pain in the right heel  and tenderness in certain areas of this wound. His MRI is tonight. I am not just looking for osteomyelitis that was brought up on the x-ray I am wondering about stress fractures, tendon ruptures etc. He has no such findings on the left. Also noteworthy is that the patient had critical illness neuropathy and some of the discomfort may be actual improvement in nerve function I am just not sure. These wounds were initially in the setting of severe critical illness related to COVID-19. He was put in a standing position. He may have also been on pressors at the point contributing to tissue ischemia. By his description at some point these wounds were grossly necrotic extending proximally up into the Achilles part of his heel. I do not know that I have ever really seen pictures of them like this although they may exist in epic We have ordered Tri layer Oasis. I am trying to stimulate some granulation in these areas. This is of course assuming the MRI is negative for infection 1/27; since the patient was last here he saw Dr. Juleen China of infectious disease. He is planned for vancomycin and ceftriaxone. Prior operative culture grew MSSA. Also ordered baseline lab work. He also ordered arterial studies although the ABIs in our clinic were normal as well as his clinical exam these were normal I do not think he needs to see vascular surgery. His ABIs at the PTA were 1.22 in the right triphasic waveforms with a normal TBI of 1.15 on the left ABI of 1.22 with triphasic waveforms and a normal TBI of 1.08. Finally he saw Dr. Amalia Hailey who will follow him in for 2 months. At this point I do not think he felt that he needed a procedure on the right calcaneal bone. Dr. Juleen China is elected for broad-spectrum antibiotic The patient is still having pain in the right heel. He walks with a walker 2/3; wounds are generally smaller. He is  tolerating his IV antibiotics. I believe this is vancomycin and ceftriaxone. We are still waiting for Oasis burn in terms of his out-of-pocket max which he should be meeting soon given the IV antibiotics, MRIs etc. I have asked him to check in on this. We are using silver collagen in the meantime the wounds look better 2/10; tolerating IV vancomycin and Rocephin. We are waiting to apply for Oasis. Although I am not really sure where he is in his out-of-pocket max. 2/17 started the first application of Oasis trilayer. Still on antibiotics. The wounds have generally look better. The area on the left has a little more surface slough requiring debridement 8/50; second application of Oasis trilayer. The wound surface granulation is generally look better. The area on the left with undermining laterally I think is come in a bit. 10/08/2020 upon evaluation today patient is here today for [employer] application #3. Fortunately he seems to be doing extremely well with regard to this and we are seeing a lot of new epithelial growth which is great news. Fortunately there is no signs of active infection at this time. Electronic Signature(s) Signed: 10/08/2020 3:50:59 PM By: Worthy Keeler PA-C Entered By: Worthy Keeler on 10/08/2020 15:50:58 -------------------------------------------------------------------------------- Physical Exam Details Patient Name: Date of Service: [name], [place]. 10/08/2020 2:15 PM Medical Record Number: [id number] Patient Account Number: [id number] Date of Birth/Sex: Treating RN: [birth year] (45 y.o. [name] Primary Care Provider: [name] Other Clinician: Referring Provider: Treating Provider/Extender: Tilda Franco in Treatment: 12 Constitutional  Well-nourished and well-hydrated in no acute distress. Respiratory normal breathing without difficulty. Psychiatric this patient is able to make decisions and demonstrates good insight into  disease process. Alert and Oriented x 3. pleasant and cooperative. Notes Patient's wound bed showed signs of good granulation epithelization at this point. There does not appear to be evidence of active infection which is great news and in general I think he is doing excellent with the Oasis. My suggestion currently based on what I am seeing is that we will probably go ahead and proceed with the third application today he is in agreement with that plan. Therefore I did actually do so we applied it to both heels during the office visit today. I secured this with a Adaptic and then Steri-Strips. Overall I think that the drawtex will help hold it into the wound bed as well that seems to be doing an excellent job. Electronic Signature(s) Signed: 10/08/2020 3:51:57 PM By: Worthy Keeler PA-C Previous Signature: 10/08/2020 3:51:40 PM Version By: Worthy Keeler PA-C Entered By: Worthy Keeler on 10/08/2020 15:51:57 -------------------------------------------------------------------------------- Physician Orders Details Patient Name: Date of Service: [name], [place]. 10/08/2020 2:15 PM Medical Record Number: [id number] Patient Account Number: [id number] Date of Birth/Sex: Treating RN: 03/11/1976 (45 y.o. [name], [name] Primary Care Provider: [name] Other Clinician: Referring Provider: Treating Provider/Extender: Tilda Franco in Treatment: 12 Verbal / Phone Orders: No Diagnosis Coding ICD-10 Coding Code Description [phone] Pressure ulcer of other site, stage 3 M62.81 Muscle weakness (generalized) I10 Essential (primary) hypertension L97.512 Non-pressure chronic ulcer of other part of right foot with fat layer exposed L97.522 Non-pressure chronic ulcer of other 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 Follow-up Appointments Return Appointment in 1 week. Cellular or Tissue Based Products Wound #1 Right Calcaneus Cellular or Tissue Based Product Type: - Oasis Trilayer  to both wounds #3. daptic or Mepitel. (DO NOT REMOVE). - Cellular or Tissue Based Product applied to wound bed, secured with steri-strips, cover with A ***apply drawtex under adaptic.*** Wound #2 Left Calcaneus Cellular or Tissue Based Product Type: - Oasis Trilayer to both wounds #3. daptic or Mepitel. (DO NOT REMOVE). - Cellular or Tissue Based Product applied to wound bed, secured with steri-strips, cover with A ***apply drawtex under adaptic.*** Bathing/ Shower/ Hygiene May shower and wash wound with soap and water. - wash wounds with dial antibacterial soap Edema Control - Lymphedema / SCD / Other Bilateral Lower Extremities Elevate legs to the level of the heart or above for 30 minutes daily and/or when sitting, a frequency of: Avoid standing for long periods of time. Off-Loading Other: - keep pressure off of the bottom of your feet Wound Treatment Wound #1 - Calcaneus Wound Laterality: Right Secondary Dressing: Woven Gauze Sponge, Non-Sterile 4x4 in (Generic) 3 x Per Week/30 Days Discharge Instructions: ensure to fold and apply up to the Collagen to ensure it keeps the Collagen in place next to wound bed. Apply over primary dressing as directed. Secondary Dressing: ABD Pad, 5x9 3 x Per Week/30 Days Discharge Instructions: Apply over primary dressing as directed. Secured With: Elastic Bandage 4 inch (ACE bandage) 3 x Per Week/30 Days Discharge Instructions: Secure with ACE bandage as directed. Secured With: [employer], 4.5x3.1 (in/yd) (Generic) 3 x Per Week/30 Days Discharge Instructions: Secure with Kerlix as directed. Secured With: Paper Tape, 2x10 (in/yd) (Generic) 3 x Per Week/30 Days Discharge Instructions: Secure dressing with tape as directed. Wound #2 - Calcaneus Wound  Laterality: Left Secondary Dressing: Woven Gauze Sponge, Non-Sterile 4x4 in (Generic) 3 x Per Week/30 Days Discharge Instructions: ensure to fold and apply up to the Collagen to ensure it keeps  the Collagen in place next to wound bed. Apply over primary dressing as directed. Secondary Dressing: ABD Pad, 5x9 3 x Per Week/30 Days Discharge Instructions: Apply over primary dressing as directed. Secured With: Elastic Bandage 4 inch (ACE bandage) 3 x Per Week/30 Days Discharge Instructions: Secure with ACE bandage as directed. Secured With: [employer], 4.5x3.1 (in/yd) (Generic) 3 x Per Week/30 Days Discharge Instructions: Secure with Kerlix as directed. Secured With: Paper Tape, 2x10 (in/yd) (Generic) 3 x Per Week/30 Days Discharge Instructions: Secure dressing with tape as directed. Electronic Signature(s) Signed: 10/08/2020 5:31:07 PM By: Worthy Keeler PA-C Signed: 10/08/2020 5:49:32 PM By: [name] Entered By: [name] on 10/08/2020 15:40:39 -------------------------------------------------------------------------------- Problem List Details Patient Name: Date of Service: [name], [place]. 10/08/2020 2:15 PM Medical Record Number: [id number] Patient Account Number: [id number] Date of Birth/Sex: Treating RN: 1976-08-04 (45 y.o. [name] Primary Care Provider: [name] Other Clinician: Referring Provider: Treating Provider/Extender: Tilda Franco in Treatment: 12 Active Problems ICD-10 Encounter Code Description Active Date MDM Diagnosis [phone] Pressure ulcer of other site, stage 3 07/15/2020 No Yes M62.81 Muscle weakness (generalized) 07/15/2020 No Yes I10 Essential (primary) hypertension 07/15/2020 No Yes L97.512 Non-pressure chronic ulcer of other part of right foot with fat layer exposed 09/03/2020 No Yes L97.522 Non-pressure chronic ulcer of other 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 09/03/2020 No Yes Inactive Problems ICD-10 Code Description Active Date Inactive Date M86.171 Other acute osteomyelitis, right ankle and foot 09/03/2020 09/03/2020 Resolved Problems Electronic Signature(s) Signed: 10/08/2020 2:49:35 PM By:  Worthy Keeler PA-C Entered By: Worthy Keeler on 10/08/2020 14:49:35 -------------------------------------------------------------------------------- Progress Note Details Patient Name: Date of Service: [name], [place]. 10/08/2020 2:15 PM Medical Record Number: [id number] Patient Account Number: [id number] Date of Birth/Sex: Treating RN: [birth year] (45 y.o. [name] Primary Care Provider: [name] Other Clinician: Referring Provider: Treating Provider/Extender: Tilda Franco in Treatment: 12 Subjective Chief Complaint Information obtained from Patient Bilateral Plantar Foot Ulcers History of Present Illness (HPI) 07/15/2020 upon evaluation today patient presents for initial inspection here in our clinic concerning issues he has been having with the bottoms of his feet bilaterally. He states these actually occurred as wounds when he was hospitalized for 5 months secondary to Covid. He was apparently with tilting bed where he was in an upright position quite frequently and apparently this occurred in some way shape or form during that time. Fortunately there is no sign of active infection at this time. No fevers, chills, nausea, vomiting, or diarrhea. With that being said he still has substantial wounds on the plantar aspects of his feet Theragen require quite a bit of work to get these to heal. He has been using Santyl currently though that is been problematic both in receiving the medication as well as actually paid for it as it is become quite expensive. Prior to the experience with Covid the patient really did not have any major medical problems other than hypertension he does have some mild generalized weakness following the Covid experience. 07/22/2020 on evaluation today patient appears to be doing okay in regard to his foot ulcers I feel like the wound beds are showing signs of better improvement that I do believe the Iodoflex is helping  in this regard.  With that being said he does have a lot of drainage currently and this is somewhat blue/green in nature which is consistent with Pseudomonas. I do think a culture today would be appropriate for [place] to evaluate and see if that is indeed the case I would likely start him on antibiotic orally as well he is not allergic to Cipro knows of no issues he has had in the past 12/21; patient was admitted to the clinic earlier this month with bilateral presumed pressure ulcers on the bottom of his feet apparently related to excessive pressure from a tilt table arrangement in the intensive care unit. Patient relates this to being on ECMO but I am not really sure that is exactly related to that. I must say I have never seen anything like this. He has fairly extensive full-thickness wounds extending from his heel towards his midfoot mostly centered laterally. There is already been some healing distally. He does not appear to have an arterial issue. He has been using gentamicin to the wound surfaces with Iodoflex to help with ongoing debridement 1/6; this is a patient with pressure ulcers on the bottom of his feet related to excessive pressure from a standing position in the intensive care unit. He is complaining of a lot of pain in the right heel. He is not a diabetic. 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 We have been using Iodoflex to help prepare the surfaces of both wounds for an advanced treatment product. He is nonambulatory spending most of his time in a wheelchair I have asked him not to propel the wheelchair with his heels 1/13; in general his wounds look better not much surface area change we have been using Iodoflex as of last week. I did an x-ray of the right heel as the patient was complaining of pain. I had some thoughts about a stress fracture perhaps Achilles tendon problems however what it showed was erosive changes along the inferior aspect of the calcaneus he now has a  MRI booked for 1/20. 1/20; in general his wounds continue to be better. Some improvement in the large narrow areas proximally in his foot. He is still complaining of pain in the right heel and tenderness in certain areas of this wound. His MRI is tonight. I am not just looking for osteomyelitis that was brought up on the x-ray I am wondering about stress fractures, tendon ruptures etc. He has no such findings on the left. Also noteworthy is that the patient had critical illness neuropathy and some of the discomfort may be actual improvement in nerve function I am just not sure. These wounds were initially in the setting of severe critical illness related to COVID-19. He was put in a standing position. He may have also been on pressors at the point contributing to tissue ischemia. By his description at some point these wounds were grossly necrotic extending proximally up into the Achilles part of his heel. I do not know that I have ever really seen pictures of them like this although they may exist in epic We have ordered Tri layer Oasis. I am trying to stimulate some granulation in these areas. This is of course assuming the MRI is negative for infection 1/27; since the patient was last here he saw Dr. Juleen China of infectious disease. He is planned for vancomycin and ceftriaxone. Prior operative culture grew MSSA. Also ordered baseline lab work. He also ordered arterial studies although the ABIs in our clinic  were normal as well as his clinical exam these were normal I do not think he needs to see vascular surgery. His ABIs at the PTA were 1.22 in the right triphasic waveforms with a normal TBI of 1.15 on the left ABI of 1.22 with triphasic waveforms and a normal TBI of 1.08. Finally he saw Dr. Amalia Hailey who will follow him in for 2 months. At this point I do not think he felt that he needed a procedure on the right calcaneal bone. Dr. Juleen China is elected for broad-spectrum antibiotic The patient is still  having pain in the right heel. He walks with a walker 2/3; wounds are generally smaller. He is tolerating his IV antibiotics. I believe this is vancomycin and ceftriaxone. We are still waiting for Oasis burn in terms of his out-of-pocket max which he should be meeting soon given the IV antibiotics, MRIs etc. I have asked him to check in on this. We are using silver collagen in the meantime the wounds look better 2/10; tolerating IV vancomycin and Rocephin. We are waiting to apply for Oasis. Although I am not really sure where he is in his out-of-pocket max. 2/17 started the first application of Oasis trilayer. Still on antibiotics. The wounds have generally look better. The area on the left has a little more surface slough requiring debridement 7/49; second application of Oasis trilayer. The wound surface granulation is generally look better. The area on the left with undermining laterally I think is come in a bit. 10/08/2020 upon evaluation today patient is here today for [employer] application #3. Fortunately he seems to be doing extremely well with regard to this and we are seeing a lot of new epithelial growth which is great news. Fortunately there is no signs of active infection at this time. Objective Constitutional Well-nourished and well-hydrated in no acute distress. Vitals Time Taken: 3:04 PM, Height: 69 in, Weight: 280 lbs, BMI: 41.3, Temperature: 98.1 F, Pulse: 83 bpm, Respiratory Rate: 18 breaths/min, Blood Pressure: 155/92 mmHg. Respiratory normal breathing without difficulty. Psychiatric this patient is able to make decisions and demonstrates good insight into disease process. Alert and Oriented x 3. pleasant and cooperative. General Notes: Patient's wound bed showed signs of good granulation epithelization at this point. There does not appear to be evidence of active infection which is great news and in general I think he is doing excellent with the Oasis. My suggestion  currently based on what I am seeing is that we will probably go ahead and proceed with the third application today he is in agreement with that plan. Therefore I did actually do so we applied it to both heels during the office visit today. I secured this with a Adaptic and then Steri-Strips. Overall I think that the drawtex will help hold it into the wound bed as well that seems to be doing an excellent job. Integumentary (Hair, Skin) Wound #1 status is Open. Original cause of wound was Pressure Injury. The date acquired was: 10/07/2019. The wound has been in treatment 12 weeks. The wound is located on the Right Calcaneus. The wound measures 4.4cm length x 1.4cm width x 0.4cm depth; 4.838cm^2 area and 1.935cm^3 volume. There is Fat Layer (Subcutaneous Tissue) exposed. There is no tunneling or undermining noted. There is a medium amount of serosanguineous drainage noted. The wound margin is thickened. There is large (67-100%) pink, pale granulation within the wound bed. There is a small (1-33%) amount of necrotic tissue within the wound bed including Adherent Slough.  Wound #2 status is Open. Original cause of wound was Pressure Injury. The date acquired was: 10/07/2019. The wound has been in treatment 12 weeks. The wound is located on the Left Calcaneus. The wound measures 5.5cm length x 2cm width x 0.4cm depth; 8.639cm^2 area and 3.456cm^3 volume. There is Fat Layer (Subcutaneous Tissue) exposed. There is undermining starting at 7:00 and ending at 2:00 with a maximum distance of 0.6cm. There is a medium amount of serosanguineous drainage noted. The wound margin is thickened. There is medium (34-66%) pink granulation within the wound bed. There is a medium (34- 66%) amount of necrotic tissue within the wound bed including Adherent Slough. Assessment Active Problems ICD-10 Pressure ulcer of other site, stage 3 Muscle weakness (generalized) Essential (primary) hypertension Non-pressure chronic ulcer of  other part of right foot with fat layer exposed Non-pressure chronic ulcer of other 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 Procedures Wound #1 Pre-procedure diagnosis of Wound #1 is a Pressure Ulcer located on the Right Calcaneus. A skin graft procedure using a bioengineered skin substitute/cellular or tissue based product was performed by Worthy Keeler, PA with the following instrument(s): Forceps and Scissors. Oasis tri-layer was applied and secured with Steri-Strips and adaptic. 17.5 sq cm of product was utilized and 0 sq cm was wasted. Post Application, no dressing was applied. A Time Out was conducted at 15:35, prior to the start of the procedure. The procedure was tolerated well with a pain level of 0 throughout and a pain level of 0 following the procedure. Post procedure Diagnosis Wound #1: Same as Pre-Procedure . Wound #2 Pre-procedure diagnosis of Wound #2 is a Pressure Ulcer located on the Left Calcaneus. A skin graft procedure using a bioengineered skin substitute/cellular or tissue based product was performed by Worthy Keeler, PA with the following instrument(s): Forceps and Scissors. Oasis tri-layer was applied and secured with Steri-Strips and adaptic. 17.5 sq cm of product was utilized and 0 sq cm was wasted. Post Application, no dressing was applied. A Time Out was conducted at 15:35, prior to the start of the procedure. The procedure was tolerated well with a pain level of 0 throughout and a pain level of 0 following the procedure. Post procedure Diagnosis Wound #2: Same as Pre-Procedure . Plan Follow-up Appointments: Return Appointment in 1 week. Cellular or Tissue Based Products: Wound #1 Right Calcaneus: Cellular or Tissue Based Product Type: - Oasis Trilayer to both wounds #3. Cellular or Tissue Based Product applied to wound bed, secured with steri-strips, cover with Adaptic or Mepitel. (DO NOT REMOVE). - ***apply drawtex under adaptic.*** Wound #2 Left  Calcaneus: Cellular or Tissue Based Product Type: - Oasis Trilayer to both wounds #3. Cellular or Tissue Based Product applied to wound bed, secured with steri-strips, cover with Adaptic or Mepitel. (DO NOT REMOVE). - ***apply drawtex under adaptic.*** Bathing/ Shower/ Hygiene: May shower and wash wound with soap and water. - wash wounds with dial antibacterial soap Edema Control - Lymphedema / SCD / Other: Elevate legs to the level of the heart or above for 30 minutes daily and/or when sitting, a frequency of: Avoid standing for long periods of time. Off-Loading: Other: - keep pressure off of the bottom of your feet WOUND #1: - Calcaneus Wound Laterality: Right Secondary Dressing: Woven Gauze Sponge, Non-Sterile 4x4 in (Generic) 3 x Per Week/30 Days Discharge Instructions: ensure to fold and apply up to the Collagen to ensure it keeps the Collagen in place next to wound bed. Apply over  primary dressing as directed. Secondary Dressing: ABD Pad, 5x9 3 x Per Week/30 Days Discharge Instructions: Apply over primary dressing as directed. Secured With: Elastic Bandage 4 inch (ACE bandage) 3 x Per Week/30 Days Discharge Instructions: Secure with ACE bandage as directed. Secured With: [employer], 4.5x3.1 (in/yd) (Generic) 3 x Per Week/30 Days Discharge Instructions: Secure with Kerlix as directed. Secured With: Paper T ape, 2x10 (in/yd) (Generic) 3 x Per Week/30 Days Discharge Instructions: Secure dressing with tape as directed. WOUND #2: - Calcaneus Wound Laterality: Left Secondary Dressing: Woven Gauze Sponge, Non-Sterile 4x4 in (Generic) 3 x Per Week/30 Days Discharge Instructions: ensure to fold and apply up to the Collagen to ensure it keeps the Collagen in place next to wound bed. Apply over primary dressing as directed. Secondary Dressing: ABD Pad, 5x9 3 x Per Week/30 Days Discharge Instructions: Apply over primary dressing as directed. Secured With: Elastic Bandage 4 inch (ACE  bandage) 3 x Per Week/30 Days Discharge Instructions: Secure with ACE bandage as directed. Secured With: [employer], 4.5x3.1 (in/yd) (Generic) 3 x Per Week/30 Days Discharge Instructions: Secure with Kerlix as directed. Secured With: Paper T ape, 2x10 (in/yd) (Generic) 3 x Per Week/30 Days Discharge Instructions: Secure dressing with tape as directed. 1. Would recommend currently that we actually continue with the Oasis trilayer which seems to be doing great for him. #3 was applied today. 2. I am also recommending patient continue to elevate his legs as much as possible try to keep pressure off as much as he can. Obviously he is not doing a whole lot of much walking or standing at all only minimal things if he actually needs to. 3. I am also going to continue to monitor for any signs of infection the right now everything appears to be doing excellent. We will see patient back for reevaluation in 1 week here in the clinic. If anything worsens or changes patient will contact our office for additional recommendations. Electronic Signature(s) Signed: 10/08/2020 3:52:54 PM By: Worthy Keeler PA-C Entered By: Worthy Keeler on 10/08/2020 15:52:53 -------------------------------------------------------------------------------- SuperBill Details Patient Name: Date of Service: [name], [place]. 10/08/2020 Medical Record Number: [id number] Patient Account Number: [id number] Date of Birth/Sex: Treating RN: [birth year] (45 y.o. [name], [name] Primary Care Provider: [name] Other Clinician: Referring Provider: Treating Provider/Extender: Tilda Franco in Treatment: 12 Diagnosis Coding ICD-10 Codes Code Description 9852001143 Pressure ulcer of other site, stage 3 M62.81 Muscle weakness (generalized) I10 Essential (primary) hypertension L97.512 Non-pressure chronic ulcer of other part of right foot with fat layer exposed L97.522 Non-pressure chronic ulcer of other  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 Facility Procedures CPT4 Code: 96295284 Description: Q4124 Oasis Ultra per sq CM Modifier: Quantity: 62 CPT4 Code: 13244010 Description: 27253 - SKIN SUB GRAFT FACE/NK/HF/G ICD-10 Diagnosis Description L89.893 Pressure ulcer of other site, stage 3 L97.512 Non-pressure chronic ulcer of other part of right foot with fat layer exposed L97.522 Non-pressure chronic ulcer of other  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 Modifier: Quantity: 1 Physician Procedures : CPT4 Code Description Modifier [id number] 74259 - WC PHYS SKIN SUB GRAFT FACE/NK/HF/G ICD-10 Diagnosis Description L89.893 Pressure ulcer of other site, stage 3 L97.512 Non-pressure chronic ulcer of other part of right foot with fat layer exposed L97.522  Non-pressure chronic ulcer of other 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 Quantity: 1 Electronic Signature(s) Signed: 10/08/2020 3:54:41 PM By: Worthy Keeler PA-C Entered By: Joaquim Lai  IIIMargarita Grizzle on 10/08/2020 15:54:41

## 2020-10-08 NOTE — Progress Notes (Signed)
[name] ([id number]) Visit Report for 10/08/2020 Arrival Information Details Patient Name: Date of Service: [name], [name] 10/08/2020 2:15 PM Medical Record Number: [id number] Patient Account Number: [id number] Date of Birth/Sex: Treating RN: 1976-01-10 (45 y.o. [name], [name] Primary Care Shalamar Plourde: [name] Other Clinician: Referring Berma Harts: Treating Jazman Reuter/Extender: Tilda Franco in Treatment: 12 Visit Information History Since Last Visit Added or deleted any medications: No Patient Arrived: Wheel Chair Any new allergies or adverse reactions: No Arrival Time: 15:03 Had a fall or experienced change in No Accompanied By: sister activities of daily living that may affect Transfer Assistance: None risk of falls: Patient Identification Verified: Yes Signs or symptoms of abuse/neglect since last visito No Secondary Verification Process Completed: Yes Hospitalized since last visit: No Patient Requires Transmission-Based Precautions: No Implantable device outside of the clinic excluding No Patient Has Alerts: No cellular tissue based products placed in the center since last visit: Has Dressing in Place as Prescribed: Yes Pain Present Now: No Electronic Signature(s) Signed: 10/08/2020 5:30:41 PM By: Sandre Kitty Entered By: Sandre Kitty on 10/08/2020 15:04:02 -------------------------------------------------------------------------------- Encounter Discharge Information Details Patient Name: Date of Service: [name], [name] 10/08/2020 2:15 PM Medical Record Number: [id number] Patient Account Number: [id number] Date of Birth/Sex: Treating RN: 11/28/1975 (45 y.o. [name] Primary Care Shaun Zuccaro: [name] Other Clinician: Referring Natalee Tomkiewicz: Treating Makylah Bossard/Extender: Tilda Franco in Treatment: 12 Encounter Discharge Information Items Post Procedure Vitals Discharge Condition: Stable Temperature (F):  98.1 Ambulatory Status: Wheelchair Pulse (bpm): 83 Discharge Destination: Home Respiratory Rate (breaths/min): 18 Transportation: Private Auto Blood Pressure (mmHg): 155/92 Accompanied By: sister Schedule Follow-up Appointment: Yes Clinical Summary of Care: Provided on 10/08/2020 Form Type Recipient Paper Patient Patient Electronic Signature(s) Signed: 10/08/2020 5:10:58 PM By: Lorrin Jackson Entered By: Lorrin Jackson on 10/08/2020 16:05:10 -------------------------------------------------------------------------------- Lower Extremity Assessment Details Patient Name: Date of Service: [name], [name] 10/08/2020 2:15 PM Medical Record Number: [id number] Patient Account Number: [id number] Date of Birth/Sex: Treating RN: 02-May-1976 (45 y.o. [name] Primary Care Jarone Ostergaard: [name] Other Clinician: Referring Leslie Langille: Treating Tessica Cupo/Extender: Tilda Franco in Treatment: 12 Edema Assessment Assessed: [name]: No] [name]: No] Edema: [Left: No] [Right: No] Vascular Assessment Pulses: Dorsalis Pedis Palpable: [Left:Yes] [Right:Yes] Electronic Signature(s) Signed: 10/08/2020 5:10:58 PM By: Lorrin Jackson Signed: 10/08/2020 5:49:32 PM By: [name] Entered By: Lorrin Jackson on 10/08/2020 15:18:35 -------------------------------------------------------------------------------- Multi-Disciplinary Care Plan Details Patient Name: Date of Service: [name], [name] 10/08/2020 2:15 PM Medical Record Number: [id number] Patient Account Number: [id number] Date of Birth/Sex: Treating RN: Jan 26, 1976 (45 y.o. [name], [name] Primary Care Vihaan Gloss: [name] Other Clinician: Referring Pia Jedlicka: Treating Bandy Honaker/Extender: Tilda Franco in Treatment: [place] reviewed with physician Active Inactive Abuse / Safety / Falls / Self Care Management Nursing Diagnoses: Potential for falls Goals: Patient/caregiver  will verbalize/demonstrate measures taken to prevent injury and/or falls Date Initiated: 07/15/2020 Target Resolution Date: 11/06/2020 Goal Status: Active Interventions: Assess fall risk on admission and as needed Assess impairment of mobility on admission and as needed per policy Notes: Wound/Skin Impairment Nursing Diagnoses: Impaired tissue integrity Knowledge deficit related to ulceration/compromised skin integrity Goals: Patient/caregiver will verbalize understanding of skin care regimen Date Initiated: 07/15/2020 Target Resolution Date: 12/04/2020 Goal Status: Active Ulcer/skin breakdown will have a volume reduction of 30% by week 4 Date Initiated: 07/15/2020 Date Inactivated: 08/20/2020 Target Resolution Date: 09/03/2020 Goal Status: Unmet Unmet Reason:  no major changes. Interventions: Assess patient/caregiver ability to obtain necessary supplies Assess patient/caregiver ability to perform ulcer/skin care regimen upon admission and as needed Assess ulceration(s) every visit Provide education on ulcer and skin care Treatment Activities: Skin care regimen initiated : 07/15/2020 Topical wound management initiated : 07/15/2020 Notes: Electronic Signature(s) Signed: 10/08/2020 5:49:32 PM By: [name] Entered By: [name] on 10/08/2020 15:35:07 -------------------------------------------------------------------------------- Pain Assessment Details Patient Name: Date of Service: [name], [name] E. 10/08/2020 2:15 PM Medical Record Number: [id number] Patient Account Number: [id number] Date of Birth/Sex: Treating RN: [birth year] (45 y.o. [name] Primary Care Gailya Tauer: [name] Other Clinician: Referring Alonnah Lampkins: Treating [name]/Extender: Tilda Franco in Treatment: 12 Active Problems Location of Pain Severity and Description of Pain Patient Has Paino No Site Locations Pain Management and Medication Current Pain Management: Electronic  Signature(s) Signed: 10/08/2020 5:30:41 PM By: Sandre Kitty Signed: 10/08/2020 5:49:32 PM By: [name] Entered By: Sandre Kitty on 10/08/2020 15:05:11 -------------------------------------------------------------------------------- Patient/Caregiver Education Details Patient Name: Date of Service: [name] 3/3/2022andnbsp2:15 PM Medical Record Number: [id number] Patient Account Number: [id number] Date of Birth/Gender: Treating RN: [birth year] (45 y.o. [name] Primary Care Physician: [name] Other Clinician: Referring Physician: Treating Physician/Extender: Tilda Franco in Treatment: 12 Education Assessment Education Provided To: Patient Education Topics Provided Wound/Skin Impairment: Handouts: Skin Care Do's and Dont's Methods: Explain/Verbal Responses: Reinforcements needed Electronic Signature(s) Signed: 10/08/2020 5:49:32 PM By: [name] Entered By: [name] on 10/08/2020 15:37:02 -------------------------------------------------------------------------------- Wound Assessment Details Patient Name: Date of Service: [name], [place]. 10/08/2020 2:15 PM Medical Record Number: [id number] Patient Account Number: [id number] Date of Birth/Sex: Treating RN: [birth year] (45 y.o. [name], [name] Primary Care Joslyne Marshburn: [name] Other Clinician: Referring Earnest Thalman: Treating Tequia Wolman/Extender: Tilda Franco in Treatment: 12 Wound Status Wound Number: 1 Primary Etiology: Pressure Ulcer Wound Location: Right Calcaneus Wound Status: Open Wounding Event: Pressure Injury Comorbid History: Asthma, Angina, Hypertension Date Acquired: 10/07/2019 Weeks Of Treatment: 12 Clustered Wound: No Photos Wound Measurements Length: (cm) 4.4 Width: (cm) 1.4 Depth: (cm) 0.4 Area: (cm) 4.838 Volume: (cm) 1.935 % Reduction in Area: 84.7% % Reduction in Volume: 93.9% Epithelialization: Medium  (34-66%) Tunneling: No Undermining: No Wound Description Classification: Category/Stage III Wound Margin: Thickened Exudate Amount: Medium Exudate Type: Serosanguineous Exudate Color: red, brown Foul Odor After Cleansing: No Slough/Fibrino Yes Wound Bed Granulation Amount: Large (67-100%) Exposed Structure Granulation Quality: Pink, Pale Fascia Exposed: No Necrotic Amount: Small (1-33%) Fat Layer (Subcutaneous Tissue) Exposed: Yes Necrotic Quality: Adherent Slough Tendon Exposed: No Muscle Exposed: No Joint Exposed: No Bone Exposed: No Treatment Notes Wound #1 (Calcaneus) Wound Laterality: Right Cleanser Peri-Wound Care Topical Primary Dressing Secondary Dressing Woven Gauze Sponge, Non-Sterile 4x4 in Discharge Instruction: ensure to fold and apply up to the Collagen to ensure it keeps the Collagen in place next to wound bed. Apply over primary dressing as directed. ABD Pad, 5x9 Discharge Instruction: Apply over primary dressing as directed. Secured With Elastic Bandage 4 inch (ACE bandage) Discharge Instruction: Secure with ACE bandage as directed. Kerlix Roll Sterile, 4.5x3.1 (in/yd) Discharge Instruction: Secure with Kerlix as directed. Paper Tape, 2x10 (in/yd) Discharge Instruction: Secure dressing with tape as directed. Compression Wrap Compression Stockings Add-Ons Electronic Signature(s) Signed: 10/08/2020 5:30:41 PM By: Sandre Kitty Signed: 10/08/2020 5:49:32 PM By: [name] Previous Signature: 10/08/2020 5:10:58 PM Version By: Lorrin Jackson Entered By: Sandre Kitty on 10/08/2020 17:27:17 -------------------------------------------------------------------------------- Wound Assessment Details Patient Name:  Date of Service: [name], [place] 10/08/2020 2:15 PM Medical Record Number: [id number] Patient Account Number: [id number] Date of Birth/Sex: Treating RN: 08/20/1975 (45 y.o. [name], [name] Primary Care Keniah Klemmer: [name] Other  Clinician: Referring Beyza Bellino: Treating Taneia Mealor/Extender: Tilda Franco in Treatment: 12 Wound Status Wound Number: 2 Primary Etiology: Pressure Ulcer Wound Location: Left Calcaneus Wound Status: Open Wounding Event: Pressure Injury Comorbid History: Asthma, Angina, Hypertension Date Acquired: 10/07/2019 Weeks Of Treatment: 12 Clustered Wound: No Photos Wound Measurements Length: (cm) 5.5 Width: (cm) 2 Depth: (cm) 0.4 Area: (cm) 8.639 Volume: (cm) 3.456 % Reduction in Area: 68.1% % Reduction in Volume: 87.2% Epithelialization: Small (1-33%) Undermining: Yes Starting Position (o'clock): 7 Ending Position (o'clock): 2 Maximum Distance: (cm) 0.6 Wound Description Classification: Category/Stage III Wound Margin: Thickened Exudate Amount: Medium Exudate Type: Serosanguineous Exudate Color: red, brown Foul Odor After Cleansing: No Slough/Fibrino Yes Wound Bed Granulation Amount: Medium (34-66%) Exposed Structure Granulation Quality: Pink Fascia Exposed: No Necrotic Amount: Medium (34-66%) Fat Layer (Subcutaneous Tissue) Exposed: Yes Necrotic Quality: Adherent Slough Tendon Exposed: No Muscle Exposed: No Joint Exposed: No Bone Exposed: No Treatment Notes Wound #2 (Calcaneus) Wound Laterality: Left Cleanser Peri-Wound Care Topical Primary Dressing Secondary Dressing Woven Gauze Sponge, Non-Sterile 4x4 in Discharge Instruction: ensure to fold and apply up to the Collagen to ensure it keeps the Collagen in place next to wound bed. Apply over primary dressing as directed. ABD Pad, 5x9 Discharge Instruction: Apply over primary dressing as directed. Secured With Elastic Bandage 4 inch (ACE bandage) Discharge Instruction: Secure with ACE bandage as directed. Kerlix Roll Sterile, 4.5x3.1 (in/yd) Discharge Instruction: Secure with Kerlix as directed. Paper Tape, 2x10 (in/yd) Discharge Instruction: Secure dressing with tape as  directed. Compression Wrap Compression Stockings Add-Ons Electronic Signature(s) Signed: 10/08/2020 5:30:41 PM By: Sandre Kitty Signed: 10/08/2020 5:49:32 PM By: [name] Previous Signature: 10/08/2020 5:10:58 PM Version By: Lorrin Jackson Entered By: Sandre Kitty on 10/08/2020 17:27:34 -------------------------------------------------------------------------------- Vitals Details Patient Name: Date of Service: [name], [place]. 10/08/2020 2:15 PM Medical Record Number: [id number] Patient Account Number: [id number] Date of Birth/Sex: Treating RN: 1976-05-05 (45 y.o. [name] Primary Care Analina Filla: [name] Other Clinician: Referring Ephrata Verville: Treating Mahala Rommel/Extender: Tilda Franco in Treatment: 12 Vital Signs Time Taken: 15:04 Temperature (F): 98.1 Height (in): 69 Pulse (bpm): 83 Weight (lbs): 280 Respiratory Rate (breaths/min): 18 Body Mass Index (BMI): 41.3 Blood Pressure (mmHg): 155/92 Reference Range: 80 - 120 mg / dl Electronic Signature(s) Signed: 10/08/2020 5:30:41 PM By: Sandre Kitty Entered By: Sandre Kitty on 10/08/2020 15:04:52

## 2020-10-10 ENCOUNTER — Other Ambulatory Visit (HOSPITAL_COMMUNITY): Payer: BC Managed Care – PPO

## 2020-10-11 DIAGNOSIS — L03115 Cellulitis of right lower limb: Secondary | ICD-10-CM | POA: Diagnosis not present

## 2020-10-11 DIAGNOSIS — M86171 Other acute osteomyelitis, right ankle and foot: Secondary | ICD-10-CM | POA: Diagnosis not present

## 2020-10-14 ENCOUNTER — Ambulatory Visit: Payer: BC Managed Care – PPO | Admitting: Pulmonary Disease

## 2020-10-14 ENCOUNTER — Telehealth: Payer: Self-pay

## 2020-10-14 ENCOUNTER — Ambulatory Visit: Payer: BC Managed Care – PPO | Admitting: Podiatry

## 2020-10-14 DIAGNOSIS — M86171 Other acute osteomyelitis, right ankle and foot: Secondary | ICD-10-CM | POA: Diagnosis not present

## 2020-10-14 DIAGNOSIS — L03115 Cellulitis of right lower limb: Secondary | ICD-10-CM | POA: Diagnosis not present

## 2020-10-14 NOTE — Telephone Encounter (Signed)
Tim with [facility] is calling for verbal orders to pull patient's picc after last dose on 10/16/20. Please advise. Makensie Mulhall T Brooks Sailors

## 2020-10-14 NOTE — Telephone Encounter (Signed)
I see pt tomorrow and will determine at that time whether he'll need to continue antibiotics or whether okay to pull PICC. Thanks, Mitzi Hansen

## 2020-10-14 NOTE — Telephone Encounter (Signed)
I spoke with Tim with [facility] and advised him that we will follow up and let [facility] know if it is ok to pul the picc.

## 2020-10-15 ENCOUNTER — Encounter: Payer: Self-pay | Admitting: Internal Medicine

## 2020-10-15 ENCOUNTER — Other Ambulatory Visit: Payer: Self-pay | Admitting: *Deleted

## 2020-10-15 ENCOUNTER — Ambulatory Visit: Payer: BC Managed Care – PPO | Admitting: Internal Medicine

## 2020-10-15 ENCOUNTER — Telehealth: Payer: Self-pay

## 2020-10-15 ENCOUNTER — Other Ambulatory Visit: Payer: Self-pay

## 2020-10-15 DIAGNOSIS — Z5181 Encounter for therapeutic drug level monitoring: Secondary | ICD-10-CM | POA: Diagnosis not present

## 2020-10-15 DIAGNOSIS — M8610 Other acute osteomyelitis, unspecified site: Secondary | ICD-10-CM

## 2020-10-15 MED ORDER — AMOXICILLIN-POT CLAVULANATE 875-125 MG PO TABS: 1.0000 | ORAL_TABLET | Freq: Two times a day (BID) | ORAL | 0 refills | Status: AC

## 2020-10-15 NOTE — Progress Notes (Signed)
[facility]  CHIEF COMPLAINT:    Follow up for calcaneal osteomyelitis  SUBJECTIVE:    [name] is a 45 y.o. male with PMHx as below who presents to the clinic for calcaneal osteomyelitis.  Last seen by me on 09/29/2020.   Patient has been on daptomycin 8 mg/kg daily and ceftriaxone 2 g daily via PICC line since 09/04/2020 for planned 6-week duration of therapy to treat his bone infection with end date anticipated 10/16/2020.  His OP AT labs have shown stable blood counts, kidney function, liver function.  His ESR remains elevated in the 50s.  CRP has been stable as well but most recently has increased from previous measurements.  In addition to antibiotics he is also been following closely with the wound center.  He most recently saw Dr. Joaquim Lai on 10/08/2020.  Per their report, patient's wound bed showed signs of good granulation epithelialization and there did not appear to be any evidence of active infection.  He still endorses some pain in foot and he reports that wound care thinks there may be a nerve injury in the area.   In summary, patient has a longstanding heel wound that developed during his prolonged hospitalization last year.  He underwent debridement and graft matrix placement in August 2021 with overall improvement in the appearance and size of his wound.  Cultures from the OR at that time did grow MSSA, however, we have been providing more broad antibiotic coverage in the setting of his calcaneal osteomyelitis diagnosed by MRI given that he had no recent updated cultures to go off of.  Please see A&P for the details of today's visit and status of the patient's medical problems.   Patient's Medications  New Prescriptions   AMOXICILLIN-CLAVULANATE (AUGMENTIN) 875-125 MG TABLET    Take 1 tablet by mouth 2 (two) times daily for 14 days.  Previous Medications   ALBUTEROL SULFATE (PROAIR RESPICLICK) 537 (90 BASE) MCG/ACT AEPB    Use 1 puff as  needed   AMLODIPINE (NORVASC) 2.5 MG TABLET    Take 1 tablet (2.5 mg total) by mouth daily.   CEFTRIAXONE (ROCEPHIN) 1 G SOLR INJECTION    Inject 1 g into the vein.   DAPTOMYCIN (CUBICIN) IVPB    Inject into the vein.   DULOXETINE (CYMBALTA) 60 MG CAPSULE    Take 1 capsule (60 mg total) by mouth daily.   FEXOFENADINE (ALLEGRA) 180 MG TABLET    1 tablet   HYDROCODONE-ACETAMINOPHEN (NORCO) 5-325 MG TABLET    Take 1 tablet by mouth 2 (two) times daily as needed for severe pain.   METOPROLOL TARTRATE (LOPRESSOR) 25 MG TABLET    TAKE 1 TABLET BY MOUTH TWICE A DAY   MONTELUKAST (SINGULAIR) 10 MG TABLET    Take 10 mg by mouth daily.   PANTOPRAZOLE (PROTONIX) 40 MG TABLET    Take 1 tablet (40 mg total) by mouth daily.   SYMBICORT 160-4.5 MCG/ACT INHALER    Inhale 2 puffs into the lungs daily as needed (cough or wheeze). Inhale two puffs twice daily   TRAZODONE (DESYREL) 100 MG TABLET    Take 1-2 tablets (100-200 mg total) by mouth at bedtime.   TRIAMCINOLONE (NASACORT) 55 MCG/ACT AERO NASAL INHALER    Place 2 sprays into the nose daily.  Modified Medications   No medications on file  Discontinued Medications   No medications on file      Past Medical History:  Diagnosis Date  . Anginal pain ([place])    with covid  . Anxiety   . Asthma   . Dyspnea   . GERD (gastroesophageal reflux disease)   . Headache   . History of kidney stones    LEFT URETERAL STONE  . HTN (hypertension)   . Pancreatitis 2018   GALLBALDDER SLUDGE CAUSED ISSUED RESOLVED  . Pneumonia 07/2019   covid    Social History   Tobacco Use  . Smoking status: Never Smoker  . Smokeless tobacco: Never Used  Vaping Use  . Vaping Use: Never used  Substance Use Topics  . Alcohol use: Yes    Comment: rarely 2-3 times a year  . Drug use: No    Family History  Problem Relation Age of Onset  . Asthma Brother   . Diabetes Father   . Hypertension Father   . Diabetes Paternal Grandmother   . Hypertension Paternal Grandmother    . Migraines Mother   . GER disease Mother   . Pancreatic cancer Maternal Grandmother   . Heart disease Maternal Grandfather     No Known Allergies  Review of Systems  Constitutional: Negative for chills and fever.  Respiratory: Negative.   Cardiovascular: Negative.   Gastrointestinal: Negative.      OBJECTIVE:    Vitals:   10/15/20 1353  BP: 140/87  Pulse: 84  Temp: 97.6 F (36.4 C)  TempSrc: Oral  SpO2: 96%   There is no height or weight on file to calculate BMI.  Physical Exam Constitutional:      General: He is not in acute distress.    Appearance: Normal appearance.     Comments: Accompanied by wife  Pulmonary:     Effort: Pulmonary effort is normal. No respiratory distress.  Musculoskeletal:     Comments: Right foot wrapped in wound care dressing.   Neurological:     General: No focal deficit present.     Mental Status: He is alert and oriented to person, place, and time.  Psychiatric:        Mood and Affect: Mood normal.        Behavior: Behavior normal.      Labs and Microbiology: CBC Latest Ref Rng & Units 08/31/2020 03/17/2020 12/02/2019  WBC 3.8 - 10.8 Thousand/uL 7.7 7.3 8.4  Hemoglobin 13.2 - 17.1 g/dL 16.0 15.8 13.2  Hematocrit 38.5 - 50.0 % 47.3 51.2 42.7  Platelets 140 - 400 Thousand/uL 138(L) 170 178   CMP Latest Ref Rng & Units 08/31/2020 03/17/2020 12/02/2019  Glucose 65 - 99 mg/dL 129(H) 124(H) 98  BUN 7 - 25 mg/dL 20 19 33(H)  Creatinine 0.60 - 1.35 mg/dL 1.10 1.21 0.93  Sodium 135 - 146 mmol/L 140 140 137  Potassium 3.5 - 5.3 mmol/L 4.2 4.3 4.4  Chloride 98 - 110 mmol/L 103 103 98  CO2 20 - 32 mmol/L _0 Calcium 8.6 - 10.3 mg/dL 9.1 9.5 9.7  Total Protein 6.1 - 8.1 g/dL 7.3 - 7.9  Total Bilirubin 0.2 - 1.2 mg/dL 0.3 - 0.5  Alkaline Phos 38 - 126 U/L - - 76  AST 10 - 40 U/L 14 - 13(L)  ALT 9 - 46 U/L 11 - 15      ASSESSMENT & PLAN:    Acute osteomyelitis (HCC) Patient will complete 6 weeks of IV daptomycin and  ceftriaxone on 10/16/2020.  His wound care specialist seems pleased with the progress he has been making based on the appearance of  his wound and there does not appear to be any evidence of infection at this time.  Inflammatory markers have been stable over the course of treatment. Recent CRP elevation relatively nonspecific as his ESR has been stable.  We will have home health remove his PICC line at the end of therapy tomorrow and provide patient with additional 2 weeks of oral Augmentin tail coverage to finish off his antibiotic course.  RTC as needed.  Encounter for therapeutic drug monitoring Labs have been stable while on daptomycin and ceftriaxone for the past 6 weeks.   No orders of the defined types were placed in this encounter.        Rayn[facility] Infect[facility] 10/15/2020, 2:15 PM  I spent 30 minutes dedicated to the care of this patient on the date of this encounter to include pre-visit review of records, face-to-face time with the patient discussing osteo, abx therapy, medication monitoring, and post-visit ordering of testing.

## 2020-10-15 NOTE — Patient Instructions (Signed)
Thank you for coming to see me today. It was a pleasure seeing you.  To Do: Marland Kitchen Finish IV antibiotics and then they'll pull your PICC line . Follow up with wound care and podiatry . Take Augmentin for 2 weeks after you finish your IV treatment  If you have any questions or concerns, please do not hesitate to call the office at [phone].  Take Care,   Jule Ser, DO

## 2020-10-15 NOTE — Telephone Encounter (Addendum)
RN relayed verbal orders to [facility] that per Dr. Juleen China okay to pull PICC after patient's last dose. [facility] states last dose is 3/12. RN confirmed with Dr. Juleen China that end date is okay. Orders repeated and verified.   Beryle Flock, RN

## 2020-10-15 NOTE — Progress Notes (Signed)
Erroneous encounter

## 2020-10-15 NOTE — Assessment & Plan Note (Addendum)
Patient will complete 6 weeks of IV daptomycin and ceftriaxone on 10/16/2020.  His wound care specialist seems pleased with the progress he has been making based on the appearance of his wound and there does not appear to be any evidence of infection at this time.  Inflammatory markers have been stable over the course of treatment. Recent CRP elevation relatively nonspecific as his ESR has been stable.  We will have home health remove his PICC line at the end of therapy tomorrow and provide patient with additional 2 weeks of oral Augmentin tail coverage to finish off his antibiotic course.  RTC as needed.

## 2020-10-15 NOTE — Assessment & Plan Note (Signed)
Labs have been stable while on daptomycin and ceftriaxone for the past 6 weeks.

## 2020-10-16 ENCOUNTER — Encounter (HOSPITAL_BASED_OUTPATIENT_CLINIC_OR_DEPARTMENT_OTHER): Payer: BC Managed Care – PPO | Admitting: Physician Assistant

## 2020-10-16 DIAGNOSIS — L89893 Pressure ulcer of other site, stage 3: Secondary | ICD-10-CM | POA: Diagnosis not present

## 2020-10-16 DIAGNOSIS — Z8616 Personal history of COVID-19: Secondary | ICD-10-CM | POA: Diagnosis not present

## 2020-10-16 DIAGNOSIS — L89623 Pressure ulcer of left heel, stage 3: Secondary | ICD-10-CM | POA: Diagnosis not present

## 2020-10-16 DIAGNOSIS — L89613 Pressure ulcer of right heel, stage 3: Secondary | ICD-10-CM | POA: Diagnosis not present

## 2020-10-16 NOTE — Progress Notes (Addendum)
[name] ([id number]) Visit Report for 10/16/2020 Chief Complaint Document Details Patient Name: Date of Service: [name], [place]. 10/16/2020 2:45 PM Medical Record Number: [id number] Patient Account Number: [id number] Date of Birth/Sex: Treating RN: 1976-06-09 (45 y.o. [name] Primary Care Provider: Cristie [name] Other Clinician: Referring Provider: Treating Provider/Extender: [name]: 13 Information Obtained from: Patient Chief Complaint Bilateral Plantar Foot Ulcers Electronic Signature(s) Signed: 10/16/2020 2:50:48 PM By: [name] Keeler PA-C Entered By: [name] on 10/16/2020 14:50:48 -------------------------------------------------------------------------------- Cellular or Tissue Based Product Details Patient Name: Date of Service: [name] 10/16/2020 2:45 PM Medical Record Number: [id number] Patient Account Number: [id number] Date of Birth/Sex: Treating RN: 11-14-1975 (45 y.o. [name] Primary Care Provider: Cristie [name] Other Clinician: Referring Provider: Treating Provider/Extender: [name]: 13 Cellular or Tissue Based Product Type Wound #1 Right Calcaneus Applied to: Performed By: Physician [name] Keeler, PA Cellular or Tissue Based Product Type: Oasis tri-layer Level of Consciousness (Pre-procedure): Awake and Alert Pre-procedure Verification/Time Out Yes - 15:42 Taken: Location: genitalia / hands / feet / multiple digits Wound Size (sq cm): 6.72 Product Size (sq cm): 18 Waste Size (sq cm): 0 Amount of Product Applied (sq cm): 18 Instrument Used: Forceps, Scissors Lot #: [id number] Order #: 4 Expiration Date: 11/16/2021 Fenestrated: No Reconstituted: Yes Solution Type: saline Solution Amount: 3 ml Lot #: [id number] Solution Expiration Date: 01/26/2022 Secured: Yes Secured With: Steri-Strips Dressing Applied: Yes Primary Dressing:  adaptic Procedural Pain: 0 Post Procedural Pain: 0 Response to [name]: Procedure was tolerated well Level of Consciousness (Post- Awake and Alert procedure): Post Procedure Diagnosis Same as Pre-procedure Electronic Signature(s) Signed: 10/16/2020 6:00:02 PM By: [name] Gouty RN, BSN Signed: 10/16/2020 7:21:59 PM By: [name] Keeler PA-C Entered By: [name] on 10/16/2020 15:46:29 -------------------------------------------------------------------------------- Cellular or Tissue Based Product Details Patient Name: Date of Service: [name], [place]. 10/16/2020 2:45 PM Medical Record Number: [id number] Patient Account Number: [id number] Date of Birth/Sex: Treating RN: [birth year] (45 y.o. [name] Primary Care Provider: Cristie [name] Other Clinician: Referring Provider: Treating Provider/Extender: [name]: 13 Cellular or Tissue Based Product Type Wound #2 Left Calcaneus Applied to: Performed By: Physician [name] Keeler, PA Cellular or Tissue Based Product Type: Oasis tri-layer Level of Consciousness (Pre-procedure): Awake and Alert Pre-procedure Verification/Time Out Yes - 15:42 Taken: Location: genitalia / hands / feet / multiple digits Wound Size (sq cm): 10 Product Size (sq cm): 17 Waste Size (sq cm): 0 Amount of Product Applied (sq cm): 17 Instrument Used: Forceps, Scissors Lot #: [id number] Order #: 4 Expiration Date: 11/16/2021 Fenestrated: No Reconstituted: Yes Solution Type: saline Solution Amount: 3 ml Lot #: [id number] Solution Expiration Date: 01/26/2022 Secured: Yes Secured With: Steri-Strips Dressing Applied: Yes Primary Dressing: adaptic Procedural Pain: 0 Post Procedural Pain: 0 Response to [name]: Procedure was tolerated well Level of Consciousness (Post- Awake and Alert procedure): Post Procedure Diagnosis Same as Pre-procedure Electronic Signature(s) Signed: 10/16/2020 6:00:02 PM By:  [name] Gouty RN, BSN Signed: 10/16/2020 7:21:59 PM By: [name] Keeler PA-C Entered By: [name] on 10/16/2020 15:47:08 -------------------------------------------------------------------------------- HPI Details Patient Name: Date of Service: [name], [place]. 10/16/2020 2:45 PM Medical Record Number: [id number] Patient Account Number: [id number] Date of Birth/Sex: Treating RN: 1975/08/15 (45 y.o. [name] Primary Care Provider: Cristie [name] Other Clinician: Referring Provider: Treating Provider/Extender: [name]  [name] in [name]: 13 History of Present Illness HPI Description: 07/15/2020 upon evaluation today patient presents for initial inspection here in our clinic concerning issues he has been having with the bottoms of his feet bilaterally. He states these actually occurred as wounds when he was hospitalized for 5 months secondary to Covid. He was apparently with tilting bed where he was in an upright position quite frequently and apparently this occurred in some way shape or form during that time. Fortunately there is no sign of active infection at this time. No fevers, chills, nausea, vomiting, or diarrhea. With that being said he still has substantial wounds on the plantar aspects of his feet Theragen require quite a bit of work to get these to heal. He has been using Santyl currently though that is been problematic both in receiving the medication as well as actually paid for it as it is become quite expensive. Prior to the experience with Covid the patient really did not have any major medical problems other than hypertension he does have some mild generalized weakness following the Covid experience. 07/22/2020 on evaluation today patient appears to be doing okay in regard to his foot ulcers I feel like the wound beds are showing signs of better improvement that I do believe the Iodoflex is helping in this regard. With that being said he does  have a lot of drainage currently and this is somewhat blue/green in nature which is consistent with Pseudomonas. I do think a culture today would be appropriate for [place] to evaluate and see if that is indeed the case I would likely start him on antibiotic orally as well he is not allergic to Cipro knows of no issues he has had in the past 12/21; patient was admitted to the clinic earlier this month with bilateral presumed pressure ulcers on the bottom of his feet apparently related to excessive pressure from a tilt table arrangement in the intensive care unit. Patient relates this to being on ECMO but I am not really sure that is exactly related to that. I must say I have never seen anything like this. He has fairly extensive full-thickness wounds extending from his heel towards his midfoot mostly centered laterally. There is already been some healing distally. He does not appear to have an arterial issue. He has been using gentamicin to the wound surfaces with Iodoflex to help with ongoing debridement 1/6; this is a patient with pressure ulcers on the bottom of his feet related to excessive pressure from a standing position in the intensive care unit. He is complaining of a lot of pain in the right heel. He is not a diabetic. 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 We have been using Iodoflex to help prepare the surfaces of both wounds for an advanced [name] product. He is nonambulatory spending most of his time in a wheelchair I have asked him not to propel the wheelchair with his heels 1/13; in general his wounds look better not much surface area change we have been using Iodoflex as of last week. I did an x-ray of the right heel as the patient was complaining of pain. I had some thoughts about a stress fracture perhaps Achilles tendon problems however what it showed was erosive changes along the inferior aspect of the calcaneus he now has a MRI booked for 1/20. 1/20; in  general his wounds continue to be better. Some improvement in the large narrow areas proximally in his foot. He is  still complaining of pain in the right heel and tenderness in certain areas of this wound. His MRI is tonight. I am not just looking for osteomyelitis that was brought up on the x-ray I am wondering about stress fractures, tendon ruptures etc. He has no such findings on the left. Also noteworthy is that the patient had critical illness neuropathy and some of the discomfort may be actual improvement in nerve function I am just not sure. These wounds were initially in the setting of severe critical illness related to COVID-19. He was put in a standing position. He may have also been on pressors at the point contributing to tissue ischemia. By his description at some point these wounds were grossly necrotic extending proximally up into the Achilles part of his heel. I do not know that I have ever really seen pictures of them like this although they may exist in epic We have ordered Tri layer Oasis. I am trying to stimulate some granulation in these areas. This is of course assuming the MRI is negative for infection 1/27; since the patient was last here he saw Dr. Juleen China of infectious disease. He is planned for vancomycin and ceftriaxone. Prior operative culture grew MSSA. Also ordered baseline lab work. He also ordered arterial studies although the ABIs in our clinic were normal as well as his clinical exam these were normal I do not think he needs to see vascular surgery. His ABIs at the PTA were 1.22 in the right triphasic waveforms with a normal TBI of 1.15 on the left ABI of 1.22 with triphasic waveforms and a normal TBI of 1.08. Finally he saw Dr. Amalia Hailey who will follow him in for 2 months. At this point I do not think he felt that he needed a procedure on the right calcaneal bone. Dr. Juleen China is elected for broad-spectrum antibiotic The patient is still having pain in the right heel.  He walks with a walker 2/3; wounds are generally smaller. He is tolerating his IV antibiotics. I believe this is vancomycin and ceftriaxone. We are still waiting for Oasis burn in terms of his out-of-pocket max which he should be meeting soon given the IV antibiotics, MRIs etc. I have asked him to check in on this. We are using silver collagen in the meantime the wounds look better 2/10; tolerating IV vancomycin and Rocephin. We are waiting to apply for Oasis. Although I am not really sure where he is in his out-of-pocket max. 2/17 started the first application of Oasis trilayer. Still on antibiotics. The wounds have generally look better. The area on the left has a little more surface slough requiring debridement 2/99; second application of Oasis trilayer. The wound surface granulation is generally look better. The area on the left with undermining laterally I think is come in a bit. 10/08/2020 upon evaluation today patient is here today for [employer] application #3. Fortunately he seems to be doing extremely well with regard to this and we are seeing a lot of new epithelial growth which is great news. Fortunately there is no signs of active infection at this time. 10/16/2020 upon evaluation today patient appears to be doing well with regard to his foot ulcers. Do believe the Oasis has been of benefit for him. I do not see any signs of infection right now which is great news and I think that he has a lot of new epithelial growth which is great to see as well. The patient is very pleased to hear all of  this. I do think we can proceed with the Oasis trilayer #4 today. Electronic Signature(s) Signed: 10/16/2020 7:12:35 PM By: [name] Keeler PA-C Entered By: [name] on 10/16/2020 19:12:35 -------------------------------------------------------------------------------- Physical Exam Details Patient Name: Date of Service: [name], [place] 10/16/2020 2:45 PM Medical Record Number:  [id number] Patient Account Number: [id number] Date of Birth/Sex: Treating RN: 1976/03/07 (45 y.o. [name] Primary Care Provider: Cristie [name] Other Clinician: Referring Provider: Treating Provider/Extender: [name]: 61 Constitutional Well-nourished and well-hydrated in no acute distress. Respiratory normal breathing without difficulty. Psychiatric this patient is able to make decisions and demonstrates good insight into disease process. Alert and Oriented x 3. pleasant and cooperative. Notes Upon inspection patient's wound bed did not require any sharp debridement today which is great news. Therefore I did proceed with going ahead and applying the Oasis today. The patient tolerated that without complication I was able to put some drawtex and behind to hold it in place followed by covering this with Adaptic and secured with Steri-Strips. Electronic Signature(s) Signed: 10/16/2020 7:12:55 PM By: [name] Keeler PA-C Entered By: [name] on 10/16/2020 19:12:55 -------------------------------------------------------------------------------- Physician Orders Details Patient Name: Date of Service: [name], [place]. 10/16/2020 2:45 PM Medical Record Number: [id number] Patient Account Number: [id number] Date of Birth/Sex: Treating RN: [birth year] (45 y.o. [name] Primary Care Provider: Cristie [name] Other Clinician: Referring Provider: Treating Provider/Extender: [name]: [phone] Verbal / Phone Orders: No Diagnosis Coding ICD-10 Coding Code Description [phone] Pressure ulcer of other site, stage 3 M62.81 Muscle weakness (generalized) I10 Essential (primary) hypertension L97.512 Non-pressure chronic ulcer of other part of right foot with fat layer exposed L97.522 Non-pressure chronic ulcer of other 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 Follow-up Appointments Return Appointment in 1  week. Cellular or Tissue Based Products Wound #1 Right Calcaneus Cellular or Tissue Based Product Type: - Oasis Trilayer to both wounds #4 daptic or Mepitel. (DO NOT REMOVE). - Cellular or Tissue Based Product applied to wound bed, secured with steri-strips, cover with A ***apply drawtex under adaptic.*** Wound #2 Left Calcaneus Cellular or Tissue Based Product Type: - Oasis Trilayer to both wounds #4 daptic or Mepitel. (DO NOT REMOVE). - Cellular or Tissue Based Product applied to wound bed, secured with steri-strips, cover with A ***apply drawtex under adaptic.*** Bathing/ Shower/ Hygiene May shower with protection but do not get wound dressing(s) wet. Edema Control - Lymphedema / SCD / Other Bilateral Lower Extremities Elevate legs to the level of the heart or above for 30 minutes daily and/or when sitting, a frequency of: Avoid standing for long periods of time. Off-Loading Other: - keep pressure off of the bottom of your feet Wound [name] Wound #1 - Calcaneus Wound Laterality: Right Secondary Dressing: Woven Gauze Sponge, Non-Sterile 4x4 in (Generic) 3 x Per Week/30 Days Discharge Instructions: ensure to fold and apply up to the Collagen to ensure it keeps the Collagen in place next to wound bed. Apply over primary dressing as directed. Secondary Dressing: ABD Pad, 5x9 3 x Per Week/30 Days Discharge Instructions: Apply over primary dressing as directed. Secured With: Elastic Bandage 4 inch (ACE bandage) 3 x Per Week/30 Days Discharge Instructions: Secure with ACE bandage as directed. Secured With: [employer], 4.5x3.1 (in/yd) (Generic) 3 x Per Week/30 Days Discharge Instructions: Secure with Kerlix as directed. Secured With: Paper Tape, 2x10 (in/yd) (Generic) 3 x Per Week/30 Days  Discharge Instructions: Secure dressing with tape as directed. Wound #2 - Calcaneus Wound Laterality: Left Secondary Dressing: Woven Gauze Sponge, Non-Sterile 4x4 in (Generic) 3 x Per  Week/30 Days Discharge Instructions: ensure to fold and apply up to the Collagen to ensure it keeps the Collagen in place next to wound bed. Apply over primary dressing as directed. Secondary Dressing: ABD Pad, 5x9 3 x Per Week/30 Days Discharge Instructions: Apply over primary dressing as directed. Secured With: Elastic Bandage 4 inch (ACE bandage) 3 x Per Week/30 Days Discharge Instructions: Secure with ACE bandage as directed. Secured With: [employer], 4.5x3.1 (in/yd) (Generic) 3 x Per Week/30 Days Discharge Instructions: Secure with Kerlix as directed. Secured With: Paper Tape, 2x10 (in/yd) (Generic) 3 x Per Week/30 Days Discharge Instructions: Secure dressing with tape as directed. Electronic Signature(s) Signed: 10/16/2020 6:00:02 PM By: [name] Gouty RN, BSN Signed: 10/16/2020 7:21:59 PM By: [name] Keeler PA-C Entered By: [name] on 10/16/2020 15:49:27 -------------------------------------------------------------------------------- Problem List Details Patient Name: Date of Service: [name], [place] 10/16/2020 2:45 PM Medical Record Number: [id number] Patient Account Number: [id number] Date of Birth/Sex: Treating RN: 12-31-1975 (45 y.o. [name] Primary Care Provider: Cristie [name] Other Clinician: Referring Provider: Treating Provider/Extender: [name]: 13 Active Problems ICD-10 Encounter Code Description Active Date MDM Diagnosis L89.893 Pressure ulcer of other site, stage 3 07/15/2020 No Yes M62.81 Muscle weakness (generalized) 07/15/2020 No Yes I10 Essential (primary) hypertension 07/15/2020 No Yes L97.512 Non-pressure chronic ulcer of other part of right foot with fat layer exposed 09/03/2020 No Yes L97.522 Non-pressure chronic ulcer of other 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 09/03/2020 No Yes Inactive Problems ICD-10 Code Description Active Date Inactive Date M86.171 Other acute osteomyelitis,  right ankle and foot 09/03/2020 09/03/2020 Resolved Problems Electronic Signature(s) Signed: 10/16/2020 2:50:42 PM By: [name] Keeler PA-C Entered By: [name] on 10/16/2020 14:50:41 -------------------------------------------------------------------------------- Progress Note Details Patient Name: Date of Service: [name], [place]. 10/16/2020 2:45 PM Medical Record Number: [id number] Patient Account Number: [id number] Date of Birth/Sex: Treating RN: September 27, 1975 (45 y.o. [name] Primary Care Provider: Cristie [name] Other Clinician: Referring Provider: Treating Provider/Extender: [name]: 13 Subjective Chief Complaint Information obtained from Patient Bilateral Plantar Foot Ulcers History of Present Illness (HPI) 07/15/2020 upon evaluation today patient presents for initial inspection here in our clinic concerning issues he has been having with the bottoms of his feet bilaterally. He states these actually occurred as wounds when he was hospitalized for 5 months secondary to Covid. He was apparently with tilting bed where he was in an upright position quite frequently and apparently this occurred in some way shape or form during that time. Fortunately there is no sign of active infection at this time. No fevers, chills, nausea, vomiting, or diarrhea. With that being said he still has substantial wounds on the plantar aspects of his feet Theragen require quite a bit of work to get these to heal. He has been using Santyl currently though that is been problematic both in receiving the medication as well as actually paid for it as it is become quite expensive. Prior to the experience with Covid the patient really did not have any major medical problems other than hypertension he does have some mild generalized weakness following the Covid experience. 07/22/2020 on evaluation today patient appears to be doing okay in regard to his foot ulcers I  feel like the  wound beds are showing signs of better improvement that I do believe the Iodoflex is helping in this regard. With that being said he does have a lot of drainage currently and this is somewhat blue/green in nature which is consistent with Pseudomonas. I do think a culture today would be appropriate for [place] to evaluate and see if that is indeed the case I would likely start him on antibiotic orally as well he is not allergic to Cipro knows of no issues he has had in the past 12/21; patient was admitted to the clinic earlier this month with bilateral presumed pressure ulcers on the bottom of his feet apparently related to excessive pressure from a tilt table arrangement in the intensive care unit. Patient relates this to being on ECMO but I am not really sure that is exactly related to that. I must say I have never seen anything like this. He has fairly extensive full-thickness wounds extending from his heel towards his midfoot mostly centered laterally. There is already been some healing distally. He does not appear to have an arterial issue. He has been using gentamicin to the wound surfaces with Iodoflex to help with ongoing debridement 1/6; this is a patient with pressure ulcers on the bottom of his feet related to excessive pressure from a standing position in the intensive care unit. He is complaining of a lot of pain in the right heel. He is not a diabetic. 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 We have been using Iodoflex to help prepare the surfaces of both wounds for an advanced [name] product. He is nonambulatory spending most of his time in a wheelchair I have asked him not to propel the wheelchair with his heels 1/13; in general his wounds look better not much surface area change we have been using Iodoflex as of last week. I did an x-ray of the right heel as the patient was complaining of pain. I had some thoughts about a stress fracture perhaps  Achilles tendon problems however what it showed was erosive changes along the inferior aspect of the calcaneus he now has a MRI booked for 1/20. 1/20; in general his wounds continue to be better. Some improvement in the large narrow areas proximally in his foot. He is still complaining of pain in the right heel and tenderness in certain areas of this wound. His MRI is tonight. I am not just looking for osteomyelitis that was brought up on the x-ray I am wondering about stress fractures, tendon ruptures etc. He has no such findings on the left. Also noteworthy is that the patient had critical illness neuropathy and some of the discomfort may be actual improvement in nerve function I am just not sure. These wounds were initially in the setting of severe critical illness related to COVID-19. He was put in a standing position. He may have also been on pressors at the point contributing to tissue ischemia. By his description at some point these wounds were grossly necrotic extending proximally up into the Achilles part of his heel. I do not know that I have ever really seen pictures of them like this although they may exist in epic We have ordered Tri layer Oasis. I am trying to stimulate some granulation in these areas. This is of course assuming the MRI is negative for infection 1/27; since the patient was last here he saw Dr. Juleen China of infectious disease. He is planned for vancomycin and ceftriaxone. Prior operative culture grew MSSA. Also  ordered baseline lab work. He also ordered arterial studies although the ABIs in our clinic were normal as well as his clinical exam these were normal I do not think he needs to see vascular surgery. His ABIs at the PTA were 1.22 in the right triphasic waveforms with a normal TBI of 1.15 on the left ABI of 1.22 with triphasic waveforms and a normal TBI of 1.08. Finally he saw Dr. Amalia Hailey who will follow him in for 2 months. At this point I do not think he felt that he  needed a procedure on the right calcaneal bone. Dr. Juleen China is elected for broad-spectrum antibiotic The patient is still having pain in the right heel. He walks with a walker 2/3; wounds are generally smaller. He is tolerating his IV antibiotics. I believe this is vancomycin and ceftriaxone. We are still waiting for Oasis burn in terms of his out-of-pocket max which he should be meeting soon given the IV antibiotics, MRIs etc. I have asked him to check in on this. We are using silver collagen in the meantime the wounds look better 2/10; tolerating IV vancomycin and Rocephin. We are waiting to apply for Oasis. Although I am not really sure where he is in his out-of-pocket max. 2/17 started the first application of Oasis trilayer. Still on antibiotics. The wounds have generally look better. The area on the left has a little more surface slough requiring debridement 6/22; second application of Oasis trilayer. The wound surface granulation is generally look better. The area on the left with undermining laterally I think is come in a bit. 10/08/2020 upon evaluation today patient is here today for [employer] application #3. Fortunately he seems to be doing extremely well with regard to this and we are seeing a lot of new epithelial growth which is great news. Fortunately there is no signs of active infection at this time. 10/16/2020 upon evaluation today patient appears to be doing well with regard to his foot ulcers. Do believe the Oasis has been of benefit for him. I do not see any signs of infection right now which is great news and I think that he has a lot of new epithelial growth which is great to see as well. The patient is very pleased to hear all of this. I do think we can proceed with the Oasis trilayer #4 today. Objective Constitutional Well-nourished and well-hydrated in no acute distress. Vitals Time Taken: 2:49 PM, Height: 69 in, Weight: 280 lbs, BMI: 41.3, Temperature: 98.8 F, Pulse:  88 bpm, Respiratory Rate: 18 breaths/min, Blood Pressure: 122/64 mmHg. Respiratory normal breathing without difficulty. Psychiatric this patient is able to make decisions and demonstrates good insight into disease process. Alert and Oriented x 3. pleasant and cooperative. General Notes: Upon inspection patient's wound bed did not require any sharp debridement today which is great news. Therefore I did proceed with going ahead and applying the Oasis today. The patient tolerated that without complication I was able to put some drawtex and behind to hold it in place followed by covering this with Adaptic and secured with Steri-Strips. Integumentary (Hair, Skin) Wound #1 status is Open. Original cause of wound was Pressure Injury. The date acquired was: 10/07/2019. The wound has been in [name] 13 weeks. The wound is located on the Right Calcaneus. The wound measures 4.2cm length x 1.6cm width x 0.4cm depth; 5.278cm^2 area and 2.111cm^3 volume. There is Fat Layer (Subcutaneous Tissue) exposed. There is no tunneling or undermining noted. There is a medium amount  of serosanguineous drainage noted. The wound margin is thickened. There is large (67-100%) pink, pale granulation within the wound bed. There is a small (1-33%) amount of necrotic tissue within the wound bed including Adherent Slough. Wound #2 status is Open. Original cause of wound was Pressure Injury. The date acquired was: 10/07/2019. The wound has been in [name] 13 weeks. The wound is located on the Left Calcaneus. The wound measures 5cm length x 2cm width x 0.1cm depth; 7.854cm^2 area and 0.785cm^3 volume. There is Fat Layer (Subcutaneous Tissue) exposed. There is no tunneling or undermining noted. There is a medium amount of serosanguineous drainage noted. The wound margin is thickened. There is medium (34-66%) pink granulation within the wound bed. There is a medium (34-66%) amount of necrotic tissue within the wound bed including  Adherent Slough. Assessment Active Problems ICD-10 Pressure ulcer of other site, stage 3 Muscle weakness (generalized) Essential (primary) hypertension Non-pressure chronic ulcer of other part of right foot with fat layer exposed Non-pressure chronic ulcer of other 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 Procedures Wound #1 Pre-procedure diagnosis of Wound #1 is a Pressure Ulcer located on the Right Calcaneus. A skin graft procedure using a bioengineered skin substitute/cellular or tissue based product was performed by [name] Keeler, PA with the following instrument(s): Forceps and Scissors. Oasis tri-layer was applied and secured with Steri-Strips. 18 sq cm of product was utilized and 0 sq cm was wasted. Post Application, adaptic was applied. A Time Out was conducted at 15:42, prior to the start of the procedure. The procedure was tolerated well with a pain level of 0 throughout and a pain level of 0 following the procedure. Post procedure Diagnosis Wound #1: Same as Pre-Procedure . Wound #2 Pre-procedure diagnosis of Wound #2 is a Pressure Ulcer located on the Left Calcaneus. A skin graft procedure using a bioengineered skin substitute/cellular or tissue based product was performed by [name] Keeler, PA with the following instrument(s): Forceps and Scissors. Oasis tri-layer was applied and secured with Steri-Strips. 17 sq cm of product was utilized and 0 sq cm was wasted. Post Application, adaptic was applied. A Time Out was conducted at 15:42, prior to the start of the procedure. The procedure was tolerated well with a pain level of 0 throughout and a pain level of 0 following the procedure. Post procedure Diagnosis Wound #2: Same as Pre-Procedure . Plan Follow-up Appointments: Return Appointment in 1 week. Cellular or Tissue Based Products: Wound #1 Right Calcaneus: Cellular or Tissue Based Product Type: - Oasis Trilayer to both wounds #4 Cellular or Tissue Based Product applied  to wound bed, secured with steri-strips, cover with Adaptic or Mepitel. (DO NOT REMOVE). - ***apply drawtex under adaptic.*** Wound #2 Left Calcaneus: Cellular or Tissue Based Product Type: - Oasis Trilayer to both wounds #4 Cellular or Tissue Based Product applied to wound bed, secured with steri-strips, cover with Adaptic or Mepitel. (DO NOT REMOVE). - ***apply drawtex under adaptic.*** Bathing/ Shower/ Hygiene: May shower with protection but do not get wound dressing(s) wet. Edema Control - Lymphedema / SCD / Other: Elevate legs to the level of the heart or above for 30 minutes daily and/or when sitting, a frequency of: Avoid standing for long periods of time. Off-Loading: Other: - keep pressure off of the bottom of your feet WOUND #1: - Calcaneus Wound Laterality: Right Secondary Dressing: Woven Gauze Sponge, Non-Sterile 4x4 in (Generic) 3 x Per Week/30 Days Discharge Instructions: ensure to fold and apply up to the  Collagen to ensure it keeps the Collagen in place next to wound bed. Apply over primary dressing as directed. Secondary Dressing: ABD Pad, 5x9 3 x Per Week/30 Days Discharge Instructions: Apply over primary dressing as directed. Secured With: Elastic Bandage 4 inch (ACE bandage) 3 x Per Week/30 Days Discharge Instructions: Secure with ACE bandage as directed. Secured With: [employer], 4.5x3.1 (in/yd) (Generic) 3 x Per Week/30 Days Discharge Instructions: Secure with Kerlix as directed. Secured With: Paper T ape, 2x10 (in/yd) (Generic) 3 x Per Week/30 Days Discharge Instructions: Secure dressing with tape as directed. WOUND #2: - Calcaneus Wound Laterality: Left Secondary Dressing: Woven Gauze Sponge, Non-Sterile 4x4 in (Generic) 3 x Per Week/30 Days Discharge Instructions: ensure to fold and apply up to the Collagen to ensure it keeps the Collagen in place next to wound bed. Apply over primary dressing as directed. Secondary Dressing: ABD Pad, 5x9 3 x Per Week/30  Days Discharge Instructions: Apply over primary dressing as directed. Secured With: Elastic Bandage 4 inch (ACE bandage) 3 x Per Week/30 Days Discharge Instructions: Secure with ACE bandage as directed. Secured With: [employer], 4.5x3.1 (in/yd) (Generic) 3 x Per Week/30 Days Discharge Instructions: Secure with Kerlix as directed. Secured With: Paper T ape, 2x10 (in/yd) (Generic) 3 x Per Week/30 Days Discharge Instructions: Secure dressing with tape as directed. 1. Would recommend currently that we continue with the Oasis I feel like he is doing a good job with this I see no reason to make any changes and that was reapplied by myself today. 2. I am also can recommend that we have the patient continue to monitor for any signs of worsening in the way of infection. Obviously this would include increased pain right now is had no issues as such. We will see patient back for reevaluation in 1 week here in the clinic. If anything worsens or changes patient will contact our office for additional recommendations. Electronic Signature(s) Signed: 10/16/2020 7:13:17 PM By: [name] Keeler PA-C Entered By: [name] on 10/16/2020 19:13:17 -------------------------------------------------------------------------------- SuperBill Details Patient Name: Date of Service: [name], [place]. 10/16/2020 Medical Record Number: [id number] Patient Account Number: [id number] Date of Birth/Sex: Treating RN: 1976-06-16 (45 y.o. [name] Primary Care Provider: Cristie [name] Other Clinician: Referring Provider: Treating Provider/Extender: [name]: 13 Diagnosis Coding ICD-10 Codes Code Description 231 110 9371 Pressure ulcer of other site, stage 3 M62.81 Muscle weakness (generalized) I10 Essential (primary) hypertension L97.512 Non-pressure chronic ulcer of other part of right foot with fat layer exposed L97.522 Non-pressure chronic ulcer of other part of  left foot with fat layer exposed Facility Procedures CPT4 Code: 27741287 Description: O6767 Oasis Ultra per sq CM Modifier: Quantity: 99 CPT4 Code: 20947096 Description: 28366 - SKIN SUB GRAFT FACE/NK/HF/G ICD-10 Diagnosis Description L97.512 Non-pressure chronic ulcer of other part of right foot with fat layer exposed L97.522 Non-pressure chronic ulcer of other 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 Modifier: Quantity: 1 Physician Procedures : CPT4 Code Description Modifier [id number] 65035 - WC PHYS SKIN SUB GRAFT FACE/NK/HF/G ICD-10 Diagnosis Description L97.512 Non-pressure chronic ulcer of other part of right foot with fat layer exposed L97.522 Non-pressure chronic ulcer of other part of  left foot with fat layer exposed Quantity: 1 Electronic Signature(s) Signed: 10/16/2020 7:13:30 PM By: [name] Keeler PA-C Previous Signature: 10/16/2020 6:00:02 PM Version By: [name] Gouty RN, BSN Entered By: [name] on 10/16/2020 19:13:30

## 2020-10-19 ENCOUNTER — Other Ambulatory Visit: Payer: Self-pay | Admitting: Neurology

## 2020-10-19 ENCOUNTER — Ambulatory Visit (INDEPENDENT_AMBULATORY_CARE_PROVIDER_SITE_OTHER): Payer: BC Managed Care – PPO | Admitting: Podiatry

## 2020-10-19 ENCOUNTER — Other Ambulatory Visit: Payer: Self-pay

## 2020-10-19 DIAGNOSIS — B351 Tinea unguium: Secondary | ICD-10-CM | POA: Diagnosis not present

## 2020-10-19 DIAGNOSIS — L97512 Non-pressure chronic ulcer of other part of right foot with fat layer exposed: Secondary | ICD-10-CM

## 2020-10-19 DIAGNOSIS — M86171 Other acute osteomyelitis, right ankle and foot: Secondary | ICD-10-CM

## 2020-10-19 DIAGNOSIS — M79674 Pain in right toe(s): Secondary | ICD-10-CM

## 2020-10-19 DIAGNOSIS — M79675 Pain in left toe(s): Secondary | ICD-10-CM

## 2020-10-19 DIAGNOSIS — L97522 Non-pressure chronic ulcer of other 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: Secondary | ICD-10-CM | POA: Diagnosis not present

## 2020-10-19 DIAGNOSIS — L03115 Cellulitis of right lower limb: Secondary | ICD-10-CM | POA: Diagnosis not present

## 2020-10-19 NOTE — Progress Notes (Signed)
VEGA, WITHROW ([id number]) Visit Report for 10/16/2020 Arrival Information Details Patient Name: Date of Service: [name], [place]. 10/16/2020 2:45 PM Medical Record Number: [id number] Patient Account Number: [id number] Date of Birth/Sex: Treating RN: 1975/12/05 (45 y.o. [name] Primary Care Jeramine Delis: [name] Other Clinician: Referring Lachlan Mckim: Treating Ellene Bloodsaw/Extender: Tilda Franco in Treatment: 65 Visit Information History Since Last Visit Added or deleted any medications: No Patient Arrived: Wheel Chair Any new allergies or adverse reactions: No Arrival Time: 14:49 Had a fall or experienced change in No Accompanied By: mother activities of daily living that may affect Transfer Assistance: None risk of falls: Patient Identification Verified: Yes Signs or symptoms of abuse/neglect since last visito No Secondary Verification Process Completed: Yes Hospitalized since last visit: No Patient Requires Transmission-Based Precautions: No Implantable device outside of the clinic excluding No Patient Has Alerts: No cellular tissue based products placed in the center since last visit: Has Dressing in Place as Prescribed: Yes Pain Present Now: No Electronic Signature(s) Signed: 10/19/2020 7:46:34 AM By: Sandre Kitty Entered By: Sandre Kitty on 10/16/2020 14:49:27 -------------------------------------------------------------------------------- Encounter Discharge Information Details Patient Name: Date of Service: [name], [place]. 10/16/2020 2:45 PM Medical Record Number: [id number] Patient Account Number: [id number] Date of Birth/Sex: Treating RN: 12-10-1975 (45 y.o. [name], [name] Primary Care Kasim Mccorkle: [name] Other Clinician: Referring Katryn Plummer: Treating Jewels Langone/Extender: Tilda Franco in Treatment: 13 Encounter Discharge Information Items Post Procedure Vitals Discharge Condition: Stable Temperature  (F): 98.8 Ambulatory Status: Wheelchair Pulse (bpm): 88 Discharge Destination: Home Respiratory Rate (breaths/min): 17 Transportation: Private Auto Blood Pressure (mmHg): 122/64 Accompanied By: self Schedule Follow-up Appointment: Yes Clinical Summary of Care: Patient Declined Electronic Signature(s) Signed: 10/16/2020 5:22:03 PM By: [name] RN Entered By: [name] on 10/16/2020 16:12:27 -------------------------------------------------------------------------------- Lower Extremity Assessment Details Patient Name: Date of Service: [name], [place] E. 10/16/2020 2:45 PM Medical Record Number: [id number] Patient Account Number: [id number] Date of Birth/Sex: Treating RN: 11/24/1975 (45 y.o. [name], [name] Primary Care Nylani Michetti: [name] Other Clinician: Referring Tramel Westbrook: Treating Morrisa Aldaba/Extender: Tilda Franco in Treatment: 13 Edema Assessment Assessed: [name]: Yes] [name]: Yes] Edema: [Left: No] [Right: No] Calf Left: Right: Point of Measurement: From Medial Instep 40 cm 42 cm Ankle Left: Right: Point of Measurement: From Medial Instep 24 cm 24 cm Vascular Assessment Pulses: Dorsalis Pedis Palpable: [Left:Yes] [Right:Yes] Posterior Tibial Palpable: [Left:Yes] [Right:Yes] Electronic Signature(s) Signed: 10/16/2020 5:22:03 PM By: [name] RN Entered By: [name] on 10/16/2020 15:06:00 -------------------------------------------------------------------------------- [place] Details Patient Name: Date of Service: [name], [place]. 10/16/2020 2:45 PM Medical Record Number: [id number] Patient Account Number: [id number] Date of Birth/Sex: Treating RN: 25-Jun-1976 (45 y.o. [name] Primary Care Kalob Bergen: [name] Other Clinician: Referring Letecia Arps: Treating Rayquon Uselman/Extender: Tilda Franco in Treatment: [place] reviewed with  physician Active Inactive Abuse / Safety / Falls / Self Care Management Nursing Diagnoses: Potential for falls Goals: Patient/caregiver will verbalize/demonstrate measures taken to prevent injury and/or falls Date Initiated: 07/15/2020 Target Resolution Date: 11/06/2020 Goal Status: Active Interventions: Assess fall risk on admission and as needed Assess impairment of mobility on admission and as needed per policy Notes: Wound/Skin Impairment Nursing Diagnoses: Impaired tissue integrity Knowledge deficit related to ulceration/compromised skin integrity Goals: Patient/caregiver will verbalize understanding of skin care regimen Date Initiated: 07/15/2020 Target Resolution Date: 12/04/2020 Goal Status: Active Ulcer/skin breakdown will have a volume reduction of  30% by week 4 Date Initiated: 07/15/2020 Date Inactivated: 08/20/2020 Target Resolution Date: 09/03/2020 Goal Status: Unmet Unmet Reason: no major changes. Interventions: Assess patient/caregiver ability to obtain necessary supplies Assess patient/caregiver ability to perform ulcer/skin care regimen upon admission and as needed Assess ulceration(s) every visit Provide education on ulcer and skin care Treatment Activities: Skin care regimen initiated : 07/15/2020 Topical wound management initiated : 07/15/2020 Notes: Electronic Signature(s) Signed: 10/16/2020 6:00:02 PM By: Baruch Gouty RN, BSN Entered By: Baruch Gouty on 10/16/2020 15:41:33 -------------------------------------------------------------------------------- Pain Assessment Details Patient Name: Date of Service: [name], [place] E. 10/16/2020 2:45 PM Medical Record Number: [id number] Patient Account Number: [id number] Date of Birth/Sex: Treating RN: [birth year] (45 y.o. [name] Primary Care Javen Hinderliter: [name] Other Clinician: Referring Constance Whittle: Treating Waunita Sandstrom/Extender: Tilda Franco in Treatment: 13 Active  Problems Location of Pain Severity and Description of Pain Patient Has Paino No Site Locations Pain Management and Medication Current Pain Management: Electronic Signature(s) Signed: 10/16/2020 6:00:02 PM By: Baruch Gouty RN, BSN Signed: 10/19/2020 7:46:34 AM By: Sandre Kitty Entered By: Sandre Kitty on 10/16/2020 14:49:53 -------------------------------------------------------------------------------- Patient/Caregiver Education Details Patient Name: Date of Service: Ciro Backer 3/11/2022andnbsp2:45 PM Medical Record Number: [id number] Patient Account Number: [id number] Date of Birth/Gender: Treating RN: Oct 22, 1975 (45 y.o. [name] Primary Care Physician: [name] Other Clinician: Referring Physician: Treating Physician/Extender: Tilda Franco in Treatment: 13 Education Assessment Education Provided To: Patient Education Topics Provided Nutrition: Offloading: Methods: Explain/Verbal Responses: Reinforcements needed, State content correctly Wound/Skin Impairment: Methods: Explain/Verbal Responses: Reinforcements needed, State content correctly Electronic Signature(s) Signed: 10/16/2020 6:00:02 PM By: Baruch Gouty RN, BSN Entered By: Baruch Gouty on 10/16/2020 15:41:57 -------------------------------------------------------------------------------- Wound Assessment Details Patient Name: Date of Service: [name], [place]. 10/16/2020 2:45 PM Medical Record Number: [id number] Patient Account Number: [id number] Date of Birth/Sex: Treating RN: March 14, 1976 (45 y.o. [name] Primary Care Aaidyn San: [name] Other Clinician: Referring Cletus Paris: Treating Jenna Routzahn/Extender: Tilda Franco in Treatment: 13 Wound Status Wound Number: 1 Primary Etiology: Pressure Ulcer Wound Location: Right Calcaneus Wound Status: Open Wounding Event: Pressure Injury Comorbid History: Asthma, Angina,  Hypertension Date Acquired: 10/07/2019 Weeks Of Treatment: 13 Clustered Wound: No Photos Wound Measurements Length: (cm) 4.2 Width: (cm) 1.6 Depth: (cm) 0.4 Area: (cm) 5.278 Volume: (cm) 2.111 % Reduction in Area: 83.3% % Reduction in Volume: 93.3% Epithelialization: Medium (34-66%) Tunneling: No Undermining: No Wound Description Classification: Category/Stage III Wound Margin: Thickened Exudate Amount: Medium Exudate Type: Serosanguineous Exudate Color: red, brown Foul Odor After Cleansing: No Slough/Fibrino Yes Wound Bed Granulation Amount: Large (67-100%) Exposed Structure Granulation Quality: Pink, Pale Fascia Exposed: No Necrotic Amount: Small (1-33%) Fat Layer (Subcutaneous Tissue) Exposed: Yes Necrotic Quality: Adherent Slough Tendon Exposed: No Muscle Exposed: No Joint Exposed: No Bone Exposed: No Treatment Notes Wound #1 (Calcaneus) Wound Laterality: Right Cleanser Peri-Wound Care Topical Primary Dressing Secondary Dressing Woven Gauze Sponge, Non-Sterile 4x4 in Discharge Instruction: ensure to fold and apply up to the Collagen to ensure it keeps the Collagen in place next to wound bed. Apply over primary dressing as directed. ABD Pad, 5x9 Discharge Instruction: Apply over primary dressing as directed. Secured With Elastic Bandage 4 inch (ACE bandage) Discharge Instruction: Secure with ACE bandage as directed. Kerlix Roll Sterile, 4.5x3.1 (in/yd) Discharge Instruction: Secure with Kerlix as directed. Paper Tape, 2x10 (in/yd) Discharge Instruction: Secure dressing with tape as directed. Compression Wrap Compression Stockings Add-Ons Electronic Signature(s) Signed: 10/16/2020 6:00:02 PM  By: Baruch Gouty RN, BSN Signed: 10/19/2020 7:46:34 AM By: Sandre Kitty Entered By: Sandre Kitty on 10/16/2020 16:37:12 -------------------------------------------------------------------------------- Wound Assessment Details Patient Name: Date of  Service: [name], [place] 10/16/2020 2:45 PM Medical Record Number: [id number] Patient Account Number: [id number] Date of Birth/Sex: Treating RN: [birth year] (45 y.o. [name] Primary Care Katiria Calame: [name] Other Clinician: Referring Custer Pimenta: Treating Leslie Langille/Extender: Tilda Franco in Treatment: 13 Wound Status Wound Number: 2 Primary Etiology: Pressure Ulcer Wound Location: Left Calcaneus Wound Status: Open Wounding Event: Pressure Injury Comorbid History: Asthma, Angina, Hypertension Date Acquired: 10/07/2019 Weeks Of Treatment: 13 Clustered Wound: No Wound Measurements Length: (cm) 5 Width: (cm) 2 Depth: (cm) 0.1 Area: (cm) 7.854 Volume: (cm) 0.785 % Reduction in Area: 71% % Reduction in Volume: 97.1% Epithelialization: Small (1-33%) Tunneling: No Undermining: No Wound Description Classification: Category/Stage III Wound Margin: Thickened Exudate Amount: Medium Exudate Type: Serosanguineous Exudate Color: red, brown Foul Odor After Cleansing: No Slough/Fibrino Yes Wound Bed Granulation Amount: Medium (34-66%) Exposed Structure Granulation Quality: Pink Fascia Exposed: No Necrotic Amount: Medium (34-66%) Fat Layer (Subcutaneous Tissue) Exposed: Yes Necrotic Quality: Adherent Slough Tendon Exposed: No Muscle Exposed: No Joint Exposed: No Bone Exposed: No Treatment Notes Wound #2 (Calcaneus) Wound Laterality: Left Cleanser Peri-Wound Care Topical Primary Dressing Secondary Dressing Woven Gauze Sponge, Non-Sterile 4x4 in Discharge Instruction: ensure to fold and apply up to the Collagen to ensure it keeps the Collagen in place next to wound bed. Apply over primary dressing as directed. ABD Pad, 5x9 Discharge Instruction: Apply over primary dressing as directed. Secured With Elastic Bandage 4 inch (ACE bandage) Discharge Instruction: Secure with ACE bandage as directed. Kerlix Roll Sterile, 4.5x3.1 (in/yd) Discharge  Instruction: Secure with Kerlix as directed. Paper Tape, 2x10 (in/yd) Discharge Instruction: Secure dressing with tape as directed. Compression Wrap Compression Stockings Add-Ons Electronic Signature(s) Signed: 10/16/2020 5:22:03 PM By: [name] RN Signed: 10/16/2020 6:00:02 PM By: Baruch Gouty RN, BSN Entered By: [name] on 10/16/2020 15:06:24 -------------------------------------------------------------------------------- Vitals Details Patient Name: Date of Service: [name], [place]. 10/16/2020 2:45 PM Medical Record Number: [id number] Patient Account Number: [id number] Date of Birth/Sex: Treating RN: 1976-07-25 (45 y.o. [name] Primary Care Tamberly Pomplun: [name] Other Clinician: Referring Lukasz Rogus: Treating Lanaysia Fritchman/Extender: Tilda Franco in Treatment: 13 Vital Signs Time Taken: 14:49 Temperature (F): 98.8 Height (in): 69 Pulse (bpm): 88 Weight (lbs): 280 Respiratory Rate (breaths/min): 18 Body Mass Index (BMI): 41.3 Blood Pressure (mmHg): 122/64 Reference Range: 80 - 120 mg / dl Electronic Signature(s) Signed: 10/19/2020 7:46:34 AM By: Sandre Kitty Entered By: Sandre Kitty on 10/16/2020 14:49:44

## 2020-10-23 ENCOUNTER — Encounter (HOSPITAL_BASED_OUTPATIENT_CLINIC_OR_DEPARTMENT_OTHER): Payer: BC Managed Care – PPO | Admitting: Internal Medicine

## 2020-10-23 ENCOUNTER — Other Ambulatory Visit: Payer: Self-pay

## 2020-10-23 DIAGNOSIS — L89613 Pressure ulcer of right heel, stage 3: Secondary | ICD-10-CM | POA: Diagnosis not present

## 2020-10-23 DIAGNOSIS — L89893 Pressure ulcer of other site, stage 3: Secondary | ICD-10-CM | POA: Diagnosis not present

## 2020-10-23 DIAGNOSIS — Z8616 Personal history of COVID-19: Secondary | ICD-10-CM | POA: Diagnosis not present

## 2020-10-23 DIAGNOSIS — L89623 Pressure ulcer of left heel, stage 3: Secondary | ICD-10-CM | POA: Diagnosis not present

## 2020-10-23 NOTE — Progress Notes (Signed)
[name] ([id number]) Visit Report for 10/23/2020 Cellular or Tissue Based Product Details Patient Name: Date of Service: [name], [place]. 10/23/2020 9:00 A M Medical Record Number: [id number] Patient Account Number: [id number] Date of Birth/Sex: Treating RN: 1976/04/29 (45 y.o. [name] Primary Care Provider: [name] Other Clinician: Referring Provider: Treating Provider/Extender: [name] in Treatment: 14 Cellular or Tissue Based Product Type Wound #1 Right Calcaneus Applied to: Performed By: Physician Ricard Dillon., MD Cellular or Tissue Based Product Type: Oasis tri-layer Level of Consciousness (Pre-procedure): Awake and Alert Pre-procedure Verification/Time Out Yes - 09:56 Taken: Location: genitalia / hands / feet / multiple digits Wound Size (sq cm): 5.6 Product Size (sq cm): 17 Waste Size (sq cm): 0 Amount of Product Applied (sq cm): 17 Instrument Used: Forceps, Scissors Lot #: [id number] Expiration Date: 11/16/2021 Reconstituted: Yes Solution Type: Saline Solution Amount: 5cc Lot #: [id number] Solution Expiration Date: 01/26/2022 Secured: Yes Secured With: Steri-Strips Dressing Applied: Yes Primary Dressing: Drawtex/Adaptic Response to Treatment: Procedure was tolerated well Level of Consciousness (Post- Awake and Alert procedure): Post Procedure Diagnosis Same as Pre-procedure Electronic Signature(s) Signed: 10/23/2020 5:26:06 PM By: Linton Ham MD Entered By: Linton Ham on 10/23/2020 10:22:50 -------------------------------------------------------------------------------- Cellular or Tissue Based Product Details Patient Name: Date of Service: [name], [place]. 10/23/2020 9:00 A M Medical Record Number: [id number] Patient Account Number: [id number] Date of Birth/Sex: Treating RN: [birth year] (45 y.o. [name] Primary Care Provider: [name] Other Clinician: Referring Provider: Treating  Provider/Extender: [name] in Treatment: 14 Cellular or Tissue Based Product Type Wound #2 Left Calcaneus Applied to: Performed By: Physician Ricard Dillon., MD Cellular or Tissue Based Product Type: Oasis tri-layer Level of Consciousness (Pre-procedure): Awake and Alert Pre-procedure Verification/Time Out Yes - 09:56 Taken: Location: genitalia / hands / feet / multiple digits Wound Size (sq cm): 8.82 Product Size (sq cm): 18 Waste Size (sq cm): 0 Amount of Product Applied (sq cm): 18 Instrument Used: Forceps, Scissors Lot #: [id number] Expiration Date: 11/16/2021 Reconstituted: Yes Solution Type: Saline Solution Amount: 5cc Lot #: [id number] Solution Expiration Date: 01/26/2022 Secured: Yes Secured With: Steri-Strips Dressing Applied: Yes Primary Dressing: Drawtex/Adaptic Response to Treatment: Procedure was tolerated well Level of Consciousness (Post- Awake and Alert procedure): Post Procedure Diagnosis Same as Pre-procedure Electronic Signature(s) Signed: 10/23/2020 5:26:06 PM By: Linton Ham MD Entered By: Linton Ham on 10/23/2020 10:23:10 -------------------------------------------------------------------------------- Debridement Details Patient Name: Date of Service: [name], [place]. 10/23/2020 9:00 A M Medical Record Number: [id number] Patient Account Number: [id number] Date of Birth/Sex: Treating RN: 1975-11-10 (45 y.o. [name] Primary Care Provider: [name] Other Clinician: Referring Provider: Treating Provider/Extender: [name] in Treatment: 14 Debridement Performed for Assessment: Wound #1 Right Calcaneus Performed By: Physician Ricard Dillon., MD Debridement Type: Debridement Level of Consciousness (Pre-procedure): Awake and Alert Pre-procedure Verification/Time Out Yes - 09:55 Taken: Start Time: 09:56 Pain Control: Other : Benzocaine T Area Debrided (L x W): otal 4  (cm) x 1.4 (cm) = 5.6 (cm) Tissue and other material debrided: Subcutaneous Level: Skin/Subcutaneous Tissue Debridement Description: Excisional Instrument: Curette Bleeding: Minimum Hemostasis Achieved: Pressure End Time: 10:00 Response to Treatment: Procedure was tolerated well Level of Consciousness (Post- Awake and Alert procedure): Post Debridement Measurements of Total Wound Length: (cm) 4 Stage: Category/Stage III Width: (cm) 1.4 Depth: (cm) 0.2 Volume: (cm) 0.88 Character of Wound/Ulcer Post Debridement: Stable Post Procedure Diagnosis Same as Pre-procedure Electronic  Signature(s) Signed: 10/23/2020 5:12:39 PM By: Baruch Gouty RN, BSN Signed: 10/23/2020 5:26:06 PM By: Linton Ham MD Entered By: Linton Ham on 10/23/2020 10:22:42 -------------------------------------------------------------------------------- Debridement Details Patient Name: Date of Service: [name], [place]. 10/23/2020 9:00 A M Medical Record Number: [id number] Patient Account Number: [id number] Date of Birth/Sex: Treating RN: 08-28-1975 (45 y.o. [name], [name] Primary Care Provider: [name] Other Clinician: Referring Provider: Treating Provider/Extender: [name] in Treatment: 14 Debridement Performed for Assessment: Wound #2 Left Calcaneus Performed By: Physician Ricard Dillon., MD Debridement Type: Debridement Level of Consciousness (Pre-procedure): Awake and Alert Pre-procedure Verification/Time Out Yes - 09:55 Taken: Start Time: 10:00 Pain Control: Other : Benzocaine T Area Debrided (L x W): otal 4.9 (cm) x 1.8 (cm) = 8.82 (cm) Tissue and other material debrided: Non-Viable, Subcutaneous Level: Skin/Subcutaneous Tissue Debridement Description: Excisional Instrument: Curette Bleeding: Minimum Hemostasis Achieved: Pressure End Time: 10:03 Response to Treatment: Procedure was tolerated well Level of Consciousness (Post- Awake and  Alert procedure): Post Debridement Measurements of Total Wound Length: (cm) 4.9 Stage: Category/Stage III Width: (cm) 1.8 Depth: (cm) 0.5 Volume: (cm) 3.464 Character of Wound/Ulcer Post Debridement: Stable Post Procedure Diagnosis Same as Pre-procedure Electronic Signature(s) Signed: 10/23/2020 5:12:39 PM By: Baruch Gouty RN, BSN Signed: 10/23/2020 5:26:06 PM By: Linton Ham MD Entered By: Linton Ham on 10/23/2020 10:23:00 -------------------------------------------------------------------------------- HPI Details Patient Name: Date of Service: [name], [place]. 10/23/2020 9:00 A M Medical Record Number: [id number] Patient Account Number: [id number] Date of Birth/Sex: Treating RN: Dec 17, 1975 (45 y.o. [name] Primary Care Provider: [name] Other Clinician: Referring Provider: Treating Provider/Extender: [name] in Treatment: 14 History of Present Illness HPI Description: 07/15/2020 upon evaluation today patient presents for initial inspection here in our clinic concerning issues he has been having with the bottoms of his feet bilaterally. He states these actually occurred as wounds when he was hospitalized for 5 months secondary to Covid. He was apparently with tilting bed where he was in an upright position quite frequently and apparently this occurred in some way shape or form during that time. Fortunately there is no sign of active infection at this time. No fevers, chills, nausea, vomiting, or diarrhea. With that being said he still has substantial wounds on the plantar aspects of his feet Theragen require quite a bit of work to get these to heal. He has been using Santyl currently though that is been problematic both in receiving the medication as well as actually paid for it as it is become quite expensive. Prior to the experience with Covid the patient really did not have any major medical problems other than hypertension he  does have some mild generalized weakness following the Covid experience. 07/22/2020 on evaluation today patient appears to be doing okay in regard to his foot ulcers I feel like the wound beds are showing signs of better improvement that I do believe the Iodoflex is helping in this regard. With that being said he does have a lot of drainage currently and this is somewhat blue/green in nature which is consistent with Pseudomonas. I do think a culture today would be appropriate for [place] to evaluate and see if that is indeed the case I would likely start him on antibiotic orally as well he is not allergic to Cipro knows of no issues he has had in the past 12/21; patient was admitted to the clinic earlier this month with bilateral presumed pressure ulcers on the bottom of  his feet apparently related to excessive pressure from a tilt table arrangement in the intensive care unit. Patient relates this to being on ECMO but I am not really sure that is exactly related to that. I must say I have never seen anything like this. He has fairly extensive full-thickness wounds extending from his heel towards his midfoot mostly centered laterally. There is already been some healing distally. He does not appear to have an arterial issue. He has been using gentamicin to the wound surfaces with Iodoflex to help with ongoing debridement 1/6; this is a patient with pressure ulcers on the bottom of his feet related to excessive pressure from a standing position in the intensive care unit. He is complaining of a lot of pain in the right heel. He is not a diabetic. 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 We have been using Iodoflex to help prepare the surfaces of both wounds for an advanced treatment product. He is nonambulatory spending most of his time in a wheelchair I have asked him not to propel the wheelchair with his heels 1/13; in general his wounds look better not much surface area change we  have been using Iodoflex as of last week. I did an x-ray of the right heel as the patient was complaining of pain. I had some thoughts about a stress fracture perhaps Achilles tendon problems however what it showed was erosive changes along the inferior aspect of the calcaneus he now has a MRI booked for 1/20. 1/20; in general his wounds continue to be better. Some improvement in the large narrow areas proximally in his foot. He is still complaining of pain in the right heel and tenderness in certain areas of this wound. His MRI is tonight. I am not just looking for osteomyelitis that was brought up on the x-ray I am wondering about stress fractures, tendon ruptures etc. He has no such findings on the left. Also noteworthy is that the patient had critical illness neuropathy and some of the discomfort may be actual improvement in nerve function I am just not sure. These wounds were initially in the setting of severe critical illness related to COVID-19. He was put in a standing position. He may have also been on pressors at the point contributing to tissue ischemia. By his description at some point these wounds were grossly necrotic extending proximally up into the Achilles part of his heel. I do not know that I have ever really seen pictures of them like this although they may exist in epic We have ordered Tri layer Oasis. I am trying to stimulate some granulation in these areas. This is of course assuming the MRI is negative for infection 1/27; since the patient was last here he saw Dr. Juleen China of infectious disease. He is planned for vancomycin and ceftriaxone. Prior operative culture grew MSSA. Also ordered baseline lab work. He also ordered arterial studies although the ABIs in our clinic were normal as well as his clinical exam these were normal I do not think he needs to see vascular surgery. His ABIs at the [facility] were 1.22 in the right triphasic waveforms with a normal TBI of 1.15 on the left ABI  of 1.22 with triphasic waveforms and a normal TBI of 1.08. Finally he saw Dr. Amalia Hailey who will follow him in for 2 months. At this point I do not think he felt that he needed a procedure on the right calcaneal bone. Dr. Juleen China is elected for broad-spectrum antibiotic  The patient is still having pain in the right heel. He walks with a walker 2/3; wounds are generally smaller. He is tolerating his IV antibiotics. I believe this is vancomycin and ceftriaxone. We are still waiting for Oasis burn in terms of his out-of-pocket max which he should be meeting soon given the IV antibiotics, MRIs etc. I have asked him to check in on this. We are using silver collagen in the meantime the wounds look better 2/10; tolerating IV vancomycin and Rocephin. We are waiting to apply for Oasis. Although I am not really sure where he is in his out-of-pocket max. 2/17 started the first application of Oasis trilayer. Still on antibiotics. The wounds have generally look better. The area on the left has a little more surface slough requiring debridement 3/78; second application of Oasis trilayer. The wound surface granulation is generally look better. The area on the left with undermining laterally I think is come in a bit. 10/08/2020 upon evaluation today patient is here today for [employer] application #3. Fortunately he seems to be doing extremely well with regard to this and we are seeing a lot of new epithelial growth which is great news. Fortunately there is no signs of active infection at this time. 10/16/2020 upon evaluation today patient appears to be doing well with regard to his foot ulcers. Do believe the Oasis has been of benefit for him. I do not see any signs of infection right now which is great news and I think that he has a lot of new epithelial growth which is great to see as well. The patient is very pleased to hear all of this. I do think we can proceed with the Oasis trilayer #4 today. 3/18; not as  much improvement in these areas on his heels that I was hoping. I did reapply trilateral Oasis today the tissue looks healthier but not as much fill in as I was hoping. Electronic Signature(s) Signed: 10/23/2020 5:26:06 PM By: Linton Ham MD Entered By: Linton Ham on 10/23/2020 10:23:49 -------------------------------------------------------------------------------- Physical Exam Details Patient Name: Date of Service: [name], [place] 10/23/2020 9:00 A M Medical Record Number: [id number] Patient Account Number: [id number] Date of Birth/Sex: Treating RN: 13-Jan-1976 (45 y.o. [name] Primary Care Provider: [name] Other Clinician: Referring Provider: Treating Provider/Extender: [name] in Treatment: 14 Constitutional Sitting or standing Blood Pressure is within target range for patient.. Pulse regular and within target range for patient.Marland Kitchen Respirations regular, non-labored and within target range.. Temperature is normal and within the target range for the patient.Marland Kitchen Appears in no distress. Notes Wound exam; the areas look about the same to me. Still some slough on the surface gentle debridement with a #5 curette. Reapplied tri-layer [employer]) Signed: 10/23/2020 5:26:06 PM By: Linton Ham MD Entered By: Linton Ham on 10/23/2020 10:24:51 -------------------------------------------------------------------------------- Physician Orders Details Patient Name: Date of Service: [name], [place]. 10/23/2020 9:00 A M Medical Record Number: [id number] Patient Account Number: [id number] Date of Birth/Sex: Treating RN: [birth year] (45 y.o. 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 Cellular or Tissue Based Products Wound #1 Right Calcaneus Cellular or Tissue Based Product Type: - Oasis Trilayer to both wounds #4, 10/23/20 #5 daptic or Mepitel. (DO NOT REMOVE). - Cellular or Tissue Based Product applied to wound bed, secured with steri-strips, cover with A ***Apply drawtex under adaptic, then 4x4 gauze, ABD pad secured with roll gauze and ACE wrap.** Wound #2 Left Calcaneus Cellular or Tissue Based Product Type: - Oasis Trilayer to both wounds #5 daptic or Mepitel. (DO NOT REMOVE). - Cellular or Tissue Based Product applied to wound bed, secured with steri-strips, cover with A ***Apply drawtex under adaptic, then 4x4 gauze, ABD pad secured with roll gauze and ACE wrap.** Bathing/ Shower/ Hygiene May shower with protection but do not get wound dressing(s) wet. Edema Control - Lymphedema / SCD / Other Bilateral Lower Extremities Elevate legs to the level of the heart or above for 30 minutes daily and/or when sitting, a frequency of: Avoid standing for long periods of time. Off-Loading Other: - keep pressure off of the bottom of your feet Wound Treatment Wound #1 - Calcaneus Wound Laterality: Right Secondary Dressing: Woven Gauze Sponge, Non-Sterile 4x4 in (Generic) 3 x Per Week/30 Days Discharge Instructions: ensure to fold and apply up to the Collagen to ensure it keeps the Collagen in place next to wound bed. Apply over primary dressing as directed. Secondary Dressing: ABD Pad, 5x9 3 x Per Week/30 Days Discharge Instructions: Apply over primary dressing as directed. Secured With: Elastic Bandage 4 inch (ACE bandage) 3 x Per Week/30 Days Discharge Instructions: Secure  with ACE bandage as directed. Secured With: [employer], 4.5x3.1 (in/yd) (Generic) 3 x Per Week/30 Days Discharge Instructions: Secure with Kerlix as directed. Secured With: Paper Tape, 2x10 (in/yd) (Generic) 3 x Per Week/30 Days Discharge Instructions: Secure dressing with tape as directed. Wound #2 - Calcaneus Wound Laterality: Left Secondary Dressing: Woven Gauze Sponge, Non-Sterile 4x4 in (Generic) 3 x Per Week/30 Days Discharge Instructions: ensure to fold and apply up to the Collagen to ensure it keeps the Collagen in place next to wound bed. Apply over primary dressing as directed. Secondary Dressing: ABD Pad, 5x9 3 x Per Week/30 Days Discharge Instructions: Apply over primary dressing as directed. Secured With: Elastic Bandage 4 inch (ACE bandage) 3 x Per Week/30 Days Discharge Instructions: Secure with ACE bandage as directed. Secured With: [employer], 4.5x3.1 (in/yd) (Generic) 3 x Per Week/30 Days Discharge Instructions: Secure with Kerlix as directed. Secured With: Paper Tape, 2x10 (in/yd) (Generic) 3 x Per Week/30 Days Discharge Instructions: Secure dressing with tape as directed. Electronic Signature(s) Signed: 10/23/2020 5:26:06 PM By: Linton Ham MD Signed: 10/23/2020 5:48:33 PM By: Lorrin Jackson Previous Signature: 10/23/2020 9:58:31 AM Version By: Lorrin Jackson Entered By: Lorrin Jackson on 10/23/2020 10:08:09 -------------------------------------------------------------------------------- Problem List Details Patient Name: Date of Service: [name], [place] 10/23/2020 9:00 A M Medical Record Number: [id number] Patient Account Number: [id number] Date of Birth/Sex: Treating RN: [birth year] (45 y.o. [name] Primary Care Provider: [name] Other Clinician: Referring Provider: Treating Provider/Extender: [name] in Treatment: 14 Active Problems ICD-10 Encounter Code Description Active Date  MDM Diagnosis [phone] Pressure ulcer of other site, stage 3 07/15/2020 No Yes M62.81 Muscle weakness (generalized) 07/15/2020 No Yes I10 Essential (primary) hypertension 07/15/2020 No Yes L97.512 Non-pressure chronic ulcer of other part of right foot with fat layer exposed 09/03/2020 No Yes L97.522 Non-pressure chronic ulcer of other part of  left foot with fat layer exposed 09/03/2020 No Yes Inactive Problems ICD-10 Code Description Active Date Inactive Date M86.171 Other acute osteomyelitis, right ankle and foot 09/03/2020 09/03/2020 Resolved Problems Electronic Signature(s) Signed: 10/23/2020 5:26:06 PM By: Linton Ham MD Previous Signature: 10/23/2020 9:54:41 AM Version By: Lorrin Jackson Entered By: Linton Ham on 10/23/2020 10:22:24 -------------------------------------------------------------------------------- Progress Note Details Patient Name: Date of Service: [name], [place]. 10/23/2020 9:00 A M Medical Record Number: [id number] Patient Account Number: [id number] Date of Birth/Sex: Treating RN: 25-Oct-1975 (45 y.o. [name] Primary Care Provider: [name] Other Clinician: Referring Provider: Treating Provider/Extender: [name] in Treatment: 14 Subjective History of Present Illness (HPI) 07/15/2020 upon evaluation today patient presents for initial inspection here in our clinic concerning issues he has been having with the bottoms of his feet bilaterally. He states these actually occurred as wounds when he was hospitalized for 5 months secondary to Covid. He was apparently with tilting bed where he was in an upright position quite frequently and apparently this occurred in some way shape or form during that time. Fortunately there is no sign of active infection at this time. No fevers, chills, nausea, vomiting, or diarrhea. With that being said he still has substantial wounds on the plantar aspects of his feet Theragen require quite a bit  of work to get these to heal. He has been using Santyl currently though that is been problematic both in receiving the medication as well as actually paid for it as it is become quite expensive. Prior to the experience with Covid the patient really did not have any major medical problems other than hypertension he does have some mild generalized weakness following the Covid experience. 07/22/2020 on evaluation today patient appears to be doing okay in regard to his foot ulcers I feel like the wound beds are showing signs of better improvement that I do believe the Iodoflex is helping in this regard. With that being said he does have a lot of drainage currently and this is somewhat blue/green in nature which is consistent with Pseudomonas. I do think a culture today would be appropriate for [place] to evaluate and see if that is indeed the case I would likely start him on antibiotic orally as well he is not allergic to Cipro knows of no issues he has had in the past 12/21; patient was admitted to the clinic earlier this month with bilateral presumed pressure ulcers on the bottom of his feet apparently related to excessive pressure from a tilt table arrangement in the intensive care unit. Patient relates this to being on ECMO but I am not really sure that is exactly related to that. I must say I have never seen anything like this. He has fairly extensive full-thickness wounds extending from his heel towards his midfoot mostly centered laterally. There is already been some healing distally. He does not appear to have an arterial issue. He has been using gentamicin to the wound surfaces with Iodoflex to help with ongoing debridement 1/6; this is a patient with pressure ulcers on the bottom of his feet related to excessive pressure from a standing position in the intensive care unit. He is complaining of a lot of pain in the right heel. He is not a diabetic. 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 We have been using Iodoflex to help prepare the surfaces of both wounds for an advanced treatment product. He is nonambulatory spending most of his time in  a wheelchair I have asked him not to propel the wheelchair with his heels 1/13; in general his wounds look better not much surface area change we have been using Iodoflex as of last week. I did an x-ray of the right heel as the patient was complaining of pain. I had some thoughts about a stress fracture perhaps Achilles tendon problems however what it showed was erosive changes along the inferior aspect of the calcaneus he now has a MRI booked for 1/20. 1/20; in general his wounds continue to be better. Some improvement in the large narrow areas proximally in his foot. He is still complaining of pain in the right heel and tenderness in certain areas of this wound. His MRI is tonight. I am not just looking for osteomyelitis that was brought up on the x-ray I am wondering about stress fractures, tendon ruptures etc. He has no such findings on the left. Also noteworthy is that the patient had critical illness neuropathy and some of the discomfort may be actual improvement in nerve function I am just not sure. These wounds were initially in the setting of severe critical illness related to COVID-19. He was put in a standing position. He may have also been on pressors at the point contributing to tissue ischemia. By his description at some point these wounds were grossly necrotic extending proximally up into the Achilles part of his heel. I do not know that I have ever really seen pictures of them like this although they may exist in epic We have ordered Tri layer Oasis. I am trying to stimulate some granulation in these areas. This is of course assuming the MRI is negative for infection 1/27; since the patient was last here he saw Dr. Juleen China of infectious disease. He is planned for vancomycin and ceftriaxone. Prior operative culture  grew MSSA. Also ordered baseline lab work. He also ordered arterial studies although the ABIs in our clinic were normal as well as his clinical exam these were normal I do not think he needs to see vascular surgery. His ABIs at the [facility] were 1.22 in the right triphasic waveforms with a normal TBI of 1.15 on the left ABI of 1.22 with triphasic waveforms and a normal TBI of 1.08. Finally he saw Dr. Amalia Hailey who will follow him in for 2 months. At this point I do not think he felt that he needed a procedure on the right calcaneal bone. Dr. Juleen China is elected for broad-spectrum antibiotic The patient is still having pain in the right heel. He walks with a walker 2/3; wounds are generally smaller. He is tolerating his IV antibiotics. I believe this is vancomycin and ceftriaxone. We are still waiting for Oasis burn in terms of his out-of-pocket max which he should be meeting soon given the IV antibiotics, MRIs etc. I have asked him to check in on this. We are using silver collagen in the meantime the wounds look better 2/10; tolerating IV vancomycin and Rocephin. We are waiting to apply for Oasis. Although I am not really sure where he is in his out-of-pocket max. 2/17 started the first application of Oasis trilayer. Still on antibiotics. The wounds have generally look better. The area on the left has a little more surface slough requiring debridement 0/03; second application of Oasis trilayer. The wound surface granulation is generally look better. The area on the left with undermining laterally I think is come in a bit. 10/08/2020 upon evaluation today patient is here today for [name] #  3. Fortunately he seems to be doing extremely well with regard to this and we are seeing a lot of new epithelial growth which is great news. Fortunately there is no signs of active infection at this time. 10/16/2020 upon evaluation today patient appears to be doing well with regard to his foot ulcers. Do  believe the Oasis has been of benefit for him. I do not see any signs of infection right now which is great news and I think that he has a lot of new epithelial growth which is great to see as well. The patient is very pleased to hear all of this. I do think we can proceed with the Oasis trilayer #4 today. 3/18; not as much improvement in these areas on his heels that I was hoping. I did reapply trilateral Oasis today the tissue looks healthier but not as much fill in as I was hoping. Objective Constitutional Sitting or standing Blood Pressure is within target range for patient.. Pulse regular and within target range for patient.Marland Kitchen Respirations regular, non-labored and within target range.. Temperature is normal and within the target range for the patient.Marland Kitchen Appears in no distress. Vitals Time Taken: 9:11 AM, Height: 69 in, Weight: 280 lbs, BMI: 41.3, Temperature: 98.2 F, Pulse: 73 bpm, Respiratory Rate: 18 breaths/min, Blood Pressure: 141/86 mmHg. General Notes: Wound exam; the areas look about the same to me. Still some slough on the surface gentle debridement with a #5 curette. Reapplied tri-layer Oasis Integumentary (Hair, Skin) Wound #1 status is Open. Original cause of wound was Pressure Injury. The date acquired was: 10/07/2019. The wound has been in treatment 14 weeks. The wound is located on the Right Calcaneus. The wound measures 4cm length x 1.4cm width x 0.2cm depth; 4.398cm^2 area and 0.88cm^3 volume. There is Fat Layer (Subcutaneous Tissue) exposed. There is no tunneling or undermining noted. There is a medium amount of serosanguineous drainage noted. The wound margin is thickened. There is large (67-100%) pink, pale granulation within the wound bed. There is a small (1-33%) amount of necrotic tissue within the wound bed including Adherent Slough. Wound #2 status is Open. Original cause of wound was Pressure Injury. The date acquired was: 10/07/2019. The wound has been in treatment 14  weeks. The wound is located on the Left Calcaneus. The wound measures 4.9cm length x 1.8cm width x 0.5cm depth; 6.927cm^2 area and 3.464cm^3 volume. There is Fat Layer (Subcutaneous Tissue) exposed. There is no tunneling or undermining noted. There is a medium amount of serosanguineous drainage noted. The wound margin is thickened. There is medium (34-66%) pink granulation within the wound bed. There is a medium (34-66%) amount of necrotic tissue within the wound bed including Adherent Slough. Assessment Active Problems ICD-10 Pressure ulcer of other site, stage 3 Muscle weakness (generalized) Essential (primary) hypertension Non-pressure chronic ulcer of other part of right foot with fat layer exposed Non-pressure chronic ulcer of other 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 Procedures Wound #1 Pre-procedure diagnosis of Wound #1 is a Pressure Ulcer located on the Right Calcaneus . There was a Excisional Skin/Subcutaneous Tissue Debridement with a total area of 5.6 sq cm performed by Ricard Dillon., MD. With the following instrument(s): Curette Material removed includes Subcutaneous Tissue after achieving pain control using Other (Benzocaine). No specimens were taken. A time out was conducted at 09:55, prior to the start of the procedure. A Minimum amount of bleeding was controlled with Pressure. The procedure was tolerated well. Post Debridement Measurements: 4cm length x  1.4cm width x 0.2cm depth; 0.88cm^3 volume. Post debridement Stage noted as Category/Stage III. Character of Wound/Ulcer Post Debridement is stable. Post procedure Diagnosis Wound #1: Same as Pre-Procedure Pre-procedure diagnosis of Wound #1 is a Pressure Ulcer located on the Right Calcaneus. A skin graft procedure using a bioengineered skin substitute/cellular or tissue based product was performed by Ricard Dillon., MD with the following instrument(s): Forceps and Scissors. Oasis tri-layer was applied  and secured with Steri-Strips. 17 sq cm of product was utilized and 0 sq cm was wasted. Post Application, Drawtex/Adaptic was applied. A Time Out was conducted at 09:56, prior to the start of the procedure. The procedure was tolerated well. Post procedure Diagnosis Wound #1: Same as Pre-Procedure . Wound #2 Pre-procedure diagnosis of Wound #2 is a Pressure Ulcer located on the Left Calcaneus . There was a Excisional Skin/Subcutaneous Tissue Debridement with a total area of 8.82 sq cm performed by Ricard Dillon., MD. With the following instrument(s): Curette to remove Non-Viable tissue/material. Material removed includes Subcutaneous Tissue after achieving pain control using Other (Benzocaine). No specimens were taken. A time out was conducted at 09:55, prior to the start of the procedure. A Minimum amount of bleeding was controlled with Pressure. The procedure was tolerated well. Post Debridement Measurements: 4.9cm length x 1.8cm width x 0.5cm depth; 3.464cm^3 volume. Post debridement Stage noted as Category/Stage III. Character of Wound/Ulcer Post Debridement is stable. Post procedure Diagnosis Wound #2: Same as Pre-Procedure Pre-procedure diagnosis of Wound #2 is a Pressure Ulcer located on the Left Calcaneus. A skin graft procedure using a bioengineered skin substitute/cellular or tissue based product was performed by Ricard Dillon., MD with the following instrument(s): Forceps and Scissors. Oasis tri-layer was applied and secured with Steri-Strips. 18 sq cm of product was utilized and 0 sq cm was wasted. Post Application, Drawtex/Adaptic was applied. A Time Out was conducted at 09:56, prior to the start of the procedure. The procedure was tolerated well. Post procedure Diagnosis Wound #2: Same as Pre-Procedure . Plan Follow-up Appointments: Return Appointment in 1 week. Cellular or Tissue Based Products: Wound #1 Right Calcaneus: Cellular or Tissue Based Product Type: - Oasis  Trilayer to both wounds #4, 10/23/20 #5 Cellular or Tissue Based Product applied to wound bed, secured with steri-strips, cover with Adaptic or Mepitel. (DO NOT REMOVE). - ***Apply drawtex under adaptic, then 4x4 gauze, ABD pad secured with roll gauze and ACE wrap.** Wound #2 Left Calcaneus: Cellular or Tissue Based Product Type: - Oasis Trilayer to both wounds #5 Cellular or Tissue Based Product applied to wound bed, secured with steri-strips, cover with Adaptic or Mepitel. (DO NOT REMOVE). - ***Apply drawtex under adaptic, then 4x4 gauze, ABD pad secured with roll gauze and ACE wrap.** Bathing/ Shower/ Hygiene: May shower with protection but do not get wound dressing(s) wet. Edema Control - Lymphedema / SCD / Other: Elevate legs to the level of the heart or above for 30 minutes daily and/or when sitting, a frequency of: Avoid standing for long periods of time. Off-Loading: Other: - keep pressure off of the bottom of your feet WOUND #1: - Calcaneus Wound Laterality: Right Secondary Dressing: Woven Gauze Sponge, Non-Sterile 4x4 in (Generic) 3 x Per Week/30 Days Discharge Instructions: ensure to fold and apply up to the Collagen to ensure it keeps the Collagen in place next to wound bed. Apply over primary dressing as directed. Secondary Dressing: ABD Pad, 5x9 3 x Per Week/30 Days Discharge Instructions: Apply over primary dressing as directed. Secured  With: Elastic Bandage 4 inch (ACE bandage) 3 x Per Week/30 Days Discharge Instructions: Secure with ACE bandage as directed. Secured With: [employer], 4.5x3.1 (in/yd) (Generic) 3 x Per Week/30 Days Discharge Instructions: Secure with Kerlix as directed. Secured With: Paper T ape, 2x10 (in/yd) (Generic) 3 x Per Week/30 Days Discharge Instructions: Secure dressing with tape as directed. WOUND #2: - Calcaneus Wound Laterality: Left Secondary Dressing: Woven Gauze Sponge, Non-Sterile 4x4 in (Generic) 3 x Per Week/30 Days Discharge  Instructions: ensure to fold and apply up to the Collagen to ensure it keeps the Collagen in place next to wound bed. Apply over primary dressing as directed. Secondary Dressing: ABD Pad, 5x9 3 x Per Week/30 Days Discharge Instructions: Apply over primary dressing as directed. Secured With: Elastic Bandage 4 inch (ACE bandage) 3 x Per Week/30 Days Discharge Instructions: Secure with ACE bandage as directed. Secured With: [employer], 4.5x3.1 (in/yd) (Generic) 3 x Per Week/30 Days Discharge Instructions: Secure with Kerlix as directed. Secured With: Paper T ape, 2x10 (in/yd) (Generic) 3 x Per Week/30 Days Discharge Instructions: Secure dressing with tape as directed. 1. Reapplied trial her Oasis although I less than thrilled about the response here. There is no doubt there has been some improvement in the epithelialization and the surface area but not as much fill-in as I was hoping especially on the undermining area on the left lateral part of the wound Electronic Signature(s) Signed: 10/23/2020 5:26:06 PM By: Linton Ham MD Entered By: Linton Ham on 10/23/2020 10:25:47 -------------------------------------------------------------------------------- SuperBill Details Patient Name: Date of Service: [name], [place]. 10/23/2020 Medical Record Number: [id number] Patient Account Number: [id number] Date of Birth/Sex: Treating RN: [birth year] (45 y.o. [name] Primary Care Provider: [name] Other Clinician: Referring Provider: Treating Provider/Extender: [name] in Treatment: 14 Diagnosis Coding ICD-10 Codes Code Description [phone] Pressure ulcer of other site, stage 3 M62.81 Muscle weakness (generalized) I10 Essential (primary) hypertension L97.512 Non-pressure chronic ulcer of other part of right foot with fat layer exposed L97.522 Non-pressure chronic ulcer of other 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 Facility  Procedures CPT4 Code: [id number] Description: P6195 Oasis Ultra per sq CM ICD-10 Diagnosis Description L97.512 Non-pressure chronic ulcer of other part of right foot with fat layer exposed L97.522 Non-pressure chronic ulcer of other 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 Modifier: Quantity: 74 CPT4 Code: [id number] Description: 45809 - SKIN SUB GRAFT FACE/NK/HF/G ICD-10 Diagnosis Description L97.522 Non-pressure chronic ulcer of other 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 L97.512 Non-pressure chronic ulcer of other part of right foot with fat layer exposed Modifier: Quantity: 1 Physician Procedures : CPT4 Code Description Modifier [id number] - WC PHYS SKIN SUB GRAFT FACE/NK/HF/G ICD-10 Diagnosis Description L97.522 Non-pressure chronic ulcer of other 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 L97.512 Non-pressure chronic ulcer of other part of  right foot with fat layer exposed Quantity: 1 Electronic Signature(s) Signed: 10/23/2020 5:26:06 PM By: Linton Ham MD Entered By: Linton Ham on 10/23/2020 10:25:59

## 2020-10-23 NOTE — Progress Notes (Addendum)
CALDWELL, KRONENBERGER ([id number]) Visit Report for 10/23/2020 Arrival Information Details Patient Name: Date of Service: [name], [place]. 10/23/2020 9:00 A M Medical Record Number: [id number] Patient Account Number: [id number] Date of Birth/Sex: Treating RN: [birth year] (46 y.o. [name] Primary Care Crystal Ellwood: [name] Other Clinician: Referring Robertine Kipper: Treating Jamicheal Heard/Extender: [name] in Treatment: 58 Visit Information History Since Last Visit Added or deleted any medications: Yes Patient Arrived: Wheel Chair Any new allergies or adverse reactions: No Arrival Time: 09:11 Had a fall or experienced change in No Accompanied By: self activities of daily living that may affect Transfer Assistance: None risk of falls: Patient Identification Verified: Yes Signs or symptoms of abuse/neglect since No Secondary Verification Process Completed: Yes last visito Patient Requires Transmission-Based Precautions: No Hospitalized since last visit: No Patient Has Alerts: No Implantable device outside of the clinic No excluding cellular tissue based products placed in the center since last visit: Has Dressing in Place as Prescribed: Yes Has Footwear/Offloading in Place as Yes Prescribed: Left: Surgical Shoe with Pressure Relief Insole Right: Surgical Shoe with Pressure Relief Insole Pain Present Now: No Electronic Signature(s) Signed: 10/23/2020 5:28:19 PM By: [name] Entered By: [name] on 10/23/2020 09:20:05 -------------------------------------------------------------------------------- Lower Extremity Assessment Details Patient Name: Date of Service: [name], [place]. 10/23/2020 9:00 A M Medical Record Number: [id number] Patient Account Number: [id number] Date of Birth/Sex: Treating RN: 1976/06/03 (45 y.o. [name] Primary Care Lorena Clearman: [name] Other Clinician: Referring Berdine Rasmusson: Treating Zayra Devito/Extender: [name] in Treatment: 14 Edema Assessment Assessed: [name]: Yes] [name]: Yes] Edema: [Left: No] [Right: No] Calf Left: Right: Point of Measurement: From Medial Instep 41.5 cm 42 cm Ankle Left: Right: Point of Measurement: From Medial Instep 25.5 cm 26 cm Vascular Assessment Pulses: Dorsalis Pedis Palpable: [Left:Yes] [Right:Yes] Electronic Signature(s) Signed: 10/23/2020 5:28:19 PM By: [name] Entered By: [name] on 10/23/2020 09:21:15 -------------------------------------------------------------------------------- Multi Wound Chart Details Patient Name: Date of Service: [name], [place]. 10/23/2020 9:00 A M Medical Record Number: [id number] Patient Account Number: [id number] Date of Birth/Sex: Treating RN: 1976-02-12 (45 y.o. [name] Primary Care Montgomery Favor: [name] Other Clinician: Referring Danaisha Celli: Treating Satsuki Zillmer/Extender: [name] in Treatment: 14 Vital Signs Height(in): 52 Pulse(bpm): 25 Weight(lbs): 37 Blood Pressure(mmHg): 141/86 Body Mass Index(BMI): 41 Temperature(F): 98.2 Respiratory Rate(breaths/min): 18 Photos: [1:No Photos Right Calcaneus] [2:No Photos Left Calcaneus] [N/A:N/A N/A] Wound Location: [1:Pressure Injury] [2:Pressure Injury] [N/A:N/A] Wounding Event: [1:Pressure Ulcer] [2:Pressure Ulcer] [N/A:N/A] Primary Etiology: [1:Asthma, Angina, Hypertension] [2:Asthma, Angina, Hypertension] [N/A:N/A] Comorbid History: [1:10/07/2019] [2:10/07/2019] [N/A:N/A] Date Acquired: [1:14] [2:14] [N/A:N/A] Weeks of Treatment: [1:Open] [2:Open] [N/A:N/A] Wound Status: [1:4x1.4x0.2] [2:4.9x1.8x0.5] [N/A:N/A] Measurements L x W x D (cm) [1:4.398] [0:2.585] [N/A:N/A] A (cm) : rea [1:0.88] [2:3.464] [N/A:N/A] Volume (cm) : [1:86.10%] [2:74.40%] [N/A:N/A] % Reduction in A rea: [1:97.20%] [2:87.20%] [N/A:N/A] % Reduction in Volume: [1:Category/Stage III] [2:Category/Stage III]  [N/A:N/A] Classification: [1:Medium] [2:Medium] [N/A:N/A] Exudate A mount: [1:Serosanguineous] [2:Serosanguineous] [N/A:N/A] Exudate Type: [1:red, brown] [2:red, brown] [N/A:N/A] Exudate Color: [1:Thickened] [2:Thickened] [N/A:N/A] Wound Margin: [1:Large (67-100%)] [2:Medium (34-66%)] [N/A:N/A] Granulation A mount: [1:Pink, Pale] [2:Pink] [N/A:N/A] Granulation Quality: [1:Small (1-33%)] [2:Medium (34-66%)] [N/A:N/A] Necrotic A mount: [1:Fat Layer (Subcutaneous Tissue): Yes Fat Layer (Subcutaneous Tissue): Yes N/A] Exposed Structures: [1:Fascia: No Tendon: No Muscle: No Joint: No Bone: No Medium (34-66%)] [2:Fascia: No Tendon: No Muscle: No Joint: No Bone: No Small (1-33%)] [N/A:N/A] Epithelialization: [1:Debridement - Excisional] [2:Debridement - Excisional] [N/A:N/A] Debridement: Pre-procedure Verification/Time  Out 09:55 [2:09:55] [N/A:N/A] Taken: [1:Other] [2:Other] [N/A:N/A] Pain Control: [1:Subcutaneous] [2:Subcutaneous] [N/A:N/A] Tissue Debrided: [1:Skin/Subcutaneous Tissue] [2:Skin/Subcutaneous Tissue] [N/A:N/A] Level: [1:5.6] [2:8.82] [N/A:N/A] Debridement A (sq cm): [1:rea Curette] [2:Curette] [N/A:N/A] Instrument: [1:Minimum] [2:Minimum] [N/A:N/A] Bleeding: [1:Pressure] [2:Pressure] [N/A:N/A] Hemostasis A chieved: [1:Procedure was tolerated well] [2:Procedure was tolerated well] [N/A:N/A] Debridement Treatment Response: [1:4x1.4x0.2] [2:4.9x1.8x0.5] [N/A:N/A] Post Debridement Measurements L x W x D (cm) [1:0.88] [9:7.673] [N/A:N/A] Post Debridement Volume: (cm) [1:Category/Stage III] [2:Category/Stage III] [N/A:N/A] Post Debridement Stage: [1:Cellular or Tissue Based Product] [2:Cellular or Tissue Based Product] [N/A:N/A] Procedures Performed: [1:Debridement] [2:Debridement] Treatment Notes Electronic Signature(s) Signed: 10/23/2020 5:12:39 PM By: Baruch Gouty RN, BSN Signed: 10/23/2020 5:26:06 PM By: Linton Ham MD Entered By: Linton Ham on 10/23/2020  10:22:32 -------------------------------------------------------------------------------- Multi-Disciplinary Care Plan Details Patient Name: Date of Service: [name], [name] 10/23/2020 9:00 A M Medical Record Number: [id number] Patient Account Number: [id number] Date of Birth/Sex: Treating RN: [birth year] (45 y.o. [name] Primary Care Redina Zeller: [name] Other Clinician: Referring Khari Mally: Treating Trig Mcbryar/Extender: [name] in Treatment: 14 Multidisciplinary Care Plan reviewed with physician Active Inactive Abuse / Safety / Falls / Self Care Management Nursing Diagnoses: Potential for falls Goals: Patient/caregiver will verbalize/demonstrate measures taken to prevent injury and/or falls Date Initiated: 07/15/2020 Target Resolution Date: 11/06/2020 Goal Status: Active Interventions: Assess fall risk on admission and as needed Assess impairment of mobility on admission and as needed per policy Notes: Wound/Skin Impairment Nursing Diagnoses: Impaired tissue integrity Knowledge deficit related to ulceration/compromised skin integrity Goals: Patient/caregiver will verbalize understanding of skin care regimen Date Initiated: 07/15/2020 Target Resolution Date: 12/04/2020 Goal Status: Active Ulcer/skin breakdown will have a volume reduction of 30% by week 4 Date Initiated: 07/15/2020 Date Inactivated: 08/20/2020 Target Resolution Date: 09/03/2020 Goal Status: Unmet Unmet Reason: no major changes. Interventions: Assess patient/caregiver ability to obtain necessary supplies Assess patient/caregiver ability to perform ulcer/skin care regimen upon admission and as needed Assess ulceration(s) every visit Provide education on ulcer and skin care Treatment Activities: Skin care regimen initiated : 07/15/2020 Topical wound management initiated : 07/15/2020 Notes: Electronic Signature(s) Signed: 10/23/2020 9:53:58 AM By: Lorrin Jackson Signed:  10/23/2020 5:12:39 PM By: Baruch Gouty RN, BSN Entered By: Lorrin Jackson on 10/23/2020 09:53:57 -------------------------------------------------------------------------------- Pain Assessment Details Patient Name: Date of Service: [name], [place]. 10/23/2020 9:00 A M Medical Record Number: [id number] Patient Account Number: [id number] Date of Birth/Sex: Treating RN: [birth year] (45 y.o. [name] Primary Care Markel Kurtenbach: [name] Other Clinician: Referring Izzak Fries: Treating Elohim Brune/Extender: [name] in Treatment: 14 Active Problems Location of Pain Severity and Description of Pain Patient Has Paino No Site Locations Rate the pain. Current Pain Level: 0 Pain Management and Medication Current Pain Management: Medication: No Cold Application: No Rest: No Massage: No Activity: No T.E.N.S.: No Heat Application: No Leg drop or elevation: No Is the Current Pain Management Adequate: Adequate How does your wound impact your activities of daily livingo Sleep: No Bathing: No Appetite: No Relationship With Others: No Bladder Continence: No Emotions: No Bowel Continence: No Work: No Toileting: No Drive: No Dressing: No Hobbies: No Electronic Signature(s) Signed: 10/23/2020 5:28:19 PM By: [name] Entered By: [name] on 10/23/2020 09:20:53 -------------------------------------------------------------------------------- Patient/Caregiver Education Details Patient Name: Date of Service: [name], [name] 3/18/2022andnbsp9:00 [place] Record Number: [id number] Patient Account Number: [id number] Date of Birth/Gender: Treating RN: [birth year] (45 y.o. [name] Primary Care Physician: [name] Other Clinician: Referring Physician: Treating Physician/Extender: Linton Ham  Marcelo Baldy in Treatment: 14 Education Assessment Education Provided To: Patient Education Topics Provided Offloading: Methods:  Explain/Verbal, Printed Responses: State content correctly Wound/Skin Impairment: Methods: Explain/Verbal, Printed Responses: State content correctly Electronic Signature(s) Signed: 10/23/2020 5:48:33 PM By: Lorrin Jackson Entered By: Lorrin Jackson on 10/23/2020 09:54:31 -------------------------------------------------------------------------------- Wound Assessment Details Patient Name: Date of Service: [name], [place] 10/23/2020 9:00 A M Medical Record Number: [id number] Patient Account Number: [id number] Date of Birth/Sex: Treating RN: 1976/05/04 (45 y.o. [name], [name] Primary Care Kathlynn Swofford: [name] Other Clinician: Referring Fabrice Dyal: Treating Phillip Maffei/Extender: [name] in Treatment: 14 Wound Status Wound Number: 1 Primary Etiology: Pressure Ulcer Wound Location: Right Calcaneus Wound Status: Open Wounding Event: Pressure Injury Comorbid History: Asthma, Angina, Hypertension Date Acquired: 10/07/2019 Weeks Of Treatment: 14 Clustered Wound: No Photos Wound Measurements Length: (cm) 4 Width: (cm) 1.4 Depth: (cm) 0.2 Area: (cm) 4.398 Volume: (cm) 0.88 % Reduction in Area: 86.1% % Reduction in Volume: 97.2% Epithelialization: Medium (34-66%) Tunneling: No Undermining: No Wound Description Classification: Category/Stage III Wound Margin: Thickened Exudate Amount: Medium Exudate Type: Serosanguineous Exudate Color: red, brown Foul Odor After Cleansing: No Slough/Fibrino Yes Wound Bed Granulation Amount: Large (67-100%) Exposed Structure Granulation Quality: Pink, Pale Fascia Exposed: No Necrotic Amount: Small (1-33%) Fat Layer (Subcutaneous Tissue) Exposed: Yes Necrotic Quality: Adherent Slough Tendon Exposed: No Muscle Exposed: No Joint Exposed: No Bone Exposed: No Electronic Signature(s) Signed: 10/26/2020 7:47:40 AM By: Sandre Kitty Signed: 10/26/2020 4:58:53 PM By: [name] Previous Signature: 10/23/2020  5:28:19 PM Version By: [name] Entered By: Sandre Kitty on 10/25/2020 12:39:43 -------------------------------------------------------------------------------- Wound Assessment Details Patient Name: Date of Service: [name], [name] 10/23/2020 9:00 A M Medical Record Number: [id number] Patient Account Number: [id number] Date of Birth/Sex: Treating RN: 09-09-1975 (45 y.o. [name], [name] Primary Care Jayvian Escoe: [name] Other Clinician: Referring Daliah Chaudoin: Treating Laken Lobato/Extender: [name] in Treatment: 14 Wound Status Wound Number: 2 Primary Etiology: Pressure Ulcer Wound Location: Left Calcaneus Wound Status: Open Wounding Event: Pressure Injury Comorbid History: Asthma, Angina, Hypertension Date Acquired: 10/07/2019 Weeks Of Treatment: 14 Clustered Wound: No Photos Wound Measurements Length: (cm) 4.9 Width: (cm) 1.8 Depth: (cm) 0.5 Area: (cm) 6.927 Volume: (cm) 3.464 % Reduction in Area: 74.4% % Reduction in Volume: 87.2% Epithelialization: Small (1-33%) Tunneling: No Undermining: No Wound Description Classification: Category/Stage III Wound Margin: Thickened Exudate Amount: Medium Exudate Type: Serosanguineous Exudate Color: red, brown Foul Odor After Cleansing: No Slough/Fibrino Yes Wound Bed Granulation Amount: Medium (34-66%) Exposed Structure Granulation Quality: Pink Fascia Exposed: No Necrotic Amount: Medium (34-66%) Fat Layer (Subcutaneous Tissue) Exposed: Yes Necrotic Quality: Adherent Slough Tendon Exposed: No Muscle Exposed: No Joint Exposed: No Bone Exposed: No Electronic Signature(s) Signed: 10/26/2020 7:47:40 AM By: Sandre Kitty Signed: 10/26/2020 4:58:53 PM By: [name] Previous Signature: 10/23/2020 5:28:19 PM Version By: [name] Entered By: Sandre Kitty on 10/25/2020 12:39:26 -------------------------------------------------------------------------------- Vitals Details Patient  Name: Date of Service: [name], [place]. 10/23/2020 9:00 A M Medical Record Number: [id number] Patient Account Number: [id number] Date of Birth/Sex: Treating RN: [birth year] (45 y.o. [name] Primary Care Cesily Cuoco: [name] Other Clinician: Referring Loretha Ure: Treating Belkys Henault/Extender: [name] in Treatment: 14 Vital Signs Time Taken: 09:11 Temperature (F): 98.2 Height (in): 69 Pulse (bpm): 73 Weight (lbs): 280 Respiratory Rate (breaths/min): 18 Body Mass Index (BMI): 41.3 Blood Pressure (mmHg): 141/86 Reference Range: 80 - 120 mg / dl Electronic Signature(s) Signed: 10/23/2020 5:28:19 PM By: [name]  Entered By: [name] on 10/23/2020 09:20:39

## 2020-10-24 ENCOUNTER — Other Ambulatory Visit (HOSPITAL_COMMUNITY)
Admission: RE | Admit: 2020-10-24 | Discharge: 2020-10-24 | Disposition: A | Discharge status: Home / Self Care | Payer: BC Managed Care – PPO | Source: Ambulatory Visit | Attending: Pulmonary Disease | Admitting: Pulmonary Disease

## 2020-10-24 DIAGNOSIS — Z20822 Contact with and (suspected) exposure to covid-19: Secondary | ICD-10-CM | POA: Diagnosis not present

## 2020-10-24 DIAGNOSIS — Z01812 Encounter for preprocedural laboratory examination: Secondary | ICD-10-CM | POA: Insufficient documentation

## 2020-10-24 LAB — SARS CORONAVIRUS 2 (TAT 6-24 HRS): SARS Coronavirus 2: NEGATIVE

## 2020-10-26 ENCOUNTER — Telehealth: Payer: Self-pay | Admitting: Podiatry

## 2020-10-26 NOTE — Telephone Encounter (Signed)
Good Morning,   I am holding for notes for 3/14 visit. Thank you

## 2020-10-28 ENCOUNTER — Encounter: Payer: Self-pay | Admitting: Pulmonary Disease

## 2020-10-28 ENCOUNTER — Ambulatory Visit: Payer: BC Managed Care – PPO | Admitting: Pulmonary Disease

## 2020-10-28 ENCOUNTER — Ambulatory Visit (INDEPENDENT_AMBULATORY_CARE_PROVIDER_SITE_OTHER): Payer: BC Managed Care – PPO | Admitting: Pulmonary Disease

## 2020-10-28 ENCOUNTER — Other Ambulatory Visit: Payer: Self-pay

## 2020-10-28 VITALS — BP 134/88 | HR 83 | Temp 98.5°F | Ht 68.0 in | Wt 306.0 lb

## 2020-10-28 DIAGNOSIS — H1010 Acute atopic conjunctivitis, unspecified eye: Secondary | ICD-10-CM

## 2020-10-28 DIAGNOSIS — U099 Post covid-19 condition, unspecified: Secondary | ICD-10-CM | POA: Diagnosis not present

## 2020-10-28 DIAGNOSIS — R0600 Dyspnea, unspecified: Secondary | ICD-10-CM | POA: Diagnosis not present

## 2020-10-28 DIAGNOSIS — J309 Allergic rhinitis, unspecified: Secondary | ICD-10-CM

## 2020-10-28 DIAGNOSIS — J4540 Moderate persistent asthma, uncomplicated: Secondary | ICD-10-CM

## 2020-10-28 DIAGNOSIS — R0609 Other forms of dyspnea: Secondary | ICD-10-CM

## 2020-10-28 LAB — PULMONARY FUNCTION TEST
DL/VA % pred: 109 %
DL/VA: 4.99 ml/min/mmHg/L
DLCO cor % pred: 72 %
DLCO cor: 20.48 ml/min/mmHg
DLCO unc % pred: 72 %
DLCO unc: 20.48 ml/min/mmHg
FEF 25-75 Post: 3.24 L/sec
FEF 25-75 Pre: 2.92 L/sec
FEF2575-%Change-Post: 11 %
FEF2575-%Pred-Post: 92 %
FEF2575-%Pred-Pre: 83 %
FEV1-%Change-Post: 2 %
FEV1-%Pred-Post: 63 %
FEV1-%Pred-Pre: 62 %
FEV1-Post: 2.4 L
FEV1-Pre: 2.35 L
FEV1FVC-%Change-Post: 1 %
FEV1FVC-%Pred-Pre: 108 %
FEV6-%Change-Post: 0 %
FEV6-%Pred-Post: 59 %
FEV6-%Pred-Pre: 59 %
FEV6-Post: 2.79 L
FEV6-Pre: 2.77 L
FEV6FVC-%Pred-Post: 103 %
FEV6FVC-%Pred-Pre: 103 %
FVC-%Change-Post: 0 %
FVC-%Pred-Post: 57 %
FVC-%Pred-Pre: 57 %
FVC-Post: 2.79 L
FVC-Pre: 2.77 L
Post FEV1/FVC ratio: 86 %
Post FEV6/FVC ratio: 100 %
Pre FEV1/FVC ratio: 85 %
Pre FEV6/FVC Ratio: 100 %

## 2020-10-28 NOTE — Progress Notes (Signed)
Full PFT performed today. °

## 2020-10-28 NOTE — Patient Instructions (Signed)
Nice to see you again  I think additional rehab, pulmonary rehab will help you in the future.  I understand we need to get the feet healed see you can be on her feet more.  My goal for you is to continue to push to help ease intact, get back to working some day, I am happy to keep working on it.  Return in 3 months for follow-up with Dr. Silas Flood, or sooner as needed

## 2020-10-28 NOTE — Patient Instructions (Signed)
Full PFT performed today. °

## 2020-10-29 NOTE — Progress Notes (Signed)
Patient ID: [name], male    DOB: June 14, 1976, 45 y.o.   MRN: [id number]  Chief Complaint  Patient presents with  . Follow-up    Follow up after PFT - review results. Intermittent congestion with clear to white discharge.     Referring provider: Michael Boston, MD  HPI:  [name] is a 45 y.o. man with history of asthma and extensive ~4 month hospitalization with COVID19 ARDS including 2 months on VV ECMO with tracheostomy s/p decannulation whom we are seeing in consultation at the request of Wile, Jesse Sans, MD for evaluation of respiratory failure.  Patient returns to clinic after several months.  Overall he is doing relatively well.  Breathing seem to slowly be improving.  No longer using daytime oxygen.  Recent sleep study with nocturnal hypoxemia prescribed 2 L nasal cannula.  No need for CPAP or BiPAP.  He continued to have ongoing issues with bilateral foot wounds.  Wearing orthopedic boot today.  Cannot exercise, improve his stamina as he is has to spend most if not all time nonweightbearing.  Endorses 20 pound weight gain.  Frustrated by this.  Want to get back to work.  Discussed my goal to get back to work to, PFTs reviewed with moderate changes.  Frankly results better than anticipated.  HPI initial visit: Multiple notes reviewed in EMR pertaining to prolonged hospitalization. Further details documented there.   His chief complaint is shortness of breath. Present since discharge from hospital late April 2021. Today he reports slow but he thinks gradual improvement over time. Still winded with relatively minimal activity especially inclines/stairs. Severity described as moderate. But during conversation he reports working in yard around house at a slow pace and his wife mentions going for a walk. So his exertional capacity is improving but slowly. 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 He endorses some  cough in evening. Not productive then but does produce a small (quarter or so size) amount of dark yellow to brown sputum. No cough during the day. No chest pain with exertion. Feels symptoms improve with once daily Symbicort. Uses albuterol in the evenings multiple times a week. Unsure if it helps, doesn't see much benefit. Thinks it may be habit. Wife agrees. Mild to minimal LE edema managed with lasix.   Has critical illness myopathy. Starting outpatient PT soon per report. He is being treated for foot ulcers as a result of prolonged immobilization in ICU. Planning what sounds like surgical debridement in the future  PMH: asthma, obesity Surgical History: ECMO cannulation, shoulder debridement 2019 Family History: asthma in brother, DM and HTN in father Social History: Lives in [place] with wife, son, worked in Proofreader prior to [employer], never smoker   Licensed conveyancer / Pulmonary Flowsheets:   ACT:  Asthma Control Test ACT Total Score  10/28/2020 17  06/04/2020 18  07/16/2019 23    Imaging: Personally reviewed with interpretation as follows: Most recent CXR 10/27/2019: Low lung volumes, bilateral diffuse infiltrates most dense in center of chest Most recent CT Chest 09/05/2019: Dense centrally distributed infiltrates with scattered GGOs in bilateral upper lobes and anterior portions of bilateral lungs  Lab Results: Personally Reviewed, Eos 300 CBC    Component Value Date/Time   WBC 7.7 08/31/2020 0000   RBC 5.66 08/31/2020 0000   HGB 16.0 08/31/2020 0000   HCT 47.3 08/31/2020 0000   PLT 138 (L)  08/31/2020 0000   MCV 83.6 08/31/2020 0000   MCH 28.3 08/31/2020 0000   MCHC 33.8 08/31/2020 0000   RDW 12.4 08/31/2020 0000   LYMPHSABS 2.0 12/02/2019 0628   MONOABS 0.7 12/02/2019 0628   EOSABS 0.3 12/02/2019 0628   BASOSABS 0.1 12/02/2019 0628    BMET    Component Value Date/Time   NA 140 08/31/2020 0000   K 4.2 08/31/2020 0000   CL 103 08/31/2020 0000   CO2 28  08/31/2020 0000   GLUCOSE 129 (H) 08/31/2020 0000   BUN 20 08/31/2020 0000   CREATININE 1.10 08/31/2020 0000   CALCIUM 9.1 08/31/2020 0000   GFRNONAA 80 08/31/2020 0000   GFRAA 93 08/31/2020 0000    BNP    Component Value Date/Time   BNP 15.3 07/22/2019 1039    ProBNP No results found for: PROBNP  Specialty Problems      Pulmonary Problems   Allergic rhinoconjunctivitis   Moderate persistent asthma   Diffuse alveolar damage (HCC)   COVID-19 long hauler manifesting chronic dyspnea   Encounter for attention to tracheostomy ([place])      No Known Allergies  Immunization History  Administered Date(s) Administered  . Influenza, Quadrivalent, Recombinant, Inj, Pf 05/13/2020  . PFIZER(Purple Top)SARS-COV-2 Vaccination 02/18/2020, 03/10/2020    Past Medical History:  Diagnosis Date  . Anginal pain ([place])    with covid  . Anxiety   . Asthma   . Dyspnea   . GERD (gastroesophageal reflux disease)   . Headache   . History of kidney stones    LEFT URETERAL STONE  . HTN (hypertension)   . Pancreatitis 2018   GALLBALDDER SLUDGE CAUSED ISSUED RESOLVED  . Pneumonia 07/2019   covid    Tobacco History: Social History   Tobacco Use  Smoking Status Never Smoker  Smokeless Tobacco Never Used   Counseling given: Not Answered    Outpatient Encounter Medications as of 10/28/2020  Medication Sig  . Albuterol Sulfate (PROAIR RESPICLICK) 637 (90 Base) MCG/ACT AEPB Use 1 puff as needed  . amLODipine (NORVASC) 2.5 MG tablet Take 1 tablet (2.5 mg total) by mouth daily.  Marland Kitchen amoxicillin-clavulanate (AUGMENTIN) 875-125 MG tablet Take 1 tablet by mouth 2 (two) times daily for 14 days.  . DULoxetine (CYMBALTA) 60 MG capsule Take 1 capsule (60 mg total) by mouth daily.  . fexofenadine (ALLEGRA) 180 MG tablet 1 tablet  . HYDROcodone-acetaminophen (NORCO) 5-325 MG tablet Take 1 tablet by mouth 2 (two) times daily as needed for severe pain.  . metoprolol tartrate (LOPRESSOR) 25 MG tablet  TAKE 1 TABLET BY MOUTH TWICE A DAY  . montelukast (SINGULAIR) 10 MG tablet Take 10 mg by mouth daily.  . pantoprazole (PROTONIX) 40 MG tablet Take 1 tablet (40 mg total) by mouth daily.  . SYMBICORT 160-4.5 MCG/ACT inhaler Inhale 2 puffs into the lungs daily as needed (cough or wheeze). Inhale two puffs twice daily  . traZODone (DESYREL) 100 MG tablet Take 1-2 tablets (100-200 mg total) by mouth at bedtime.  . triamcinolone (NASACORT) 55 MCG/ACT AERO nasal inhaler Place 2 sprays into the nose daily.  . [DISCONTINUED] cefTRIAXone (ROCEPHIN) 1 g SOLR injection Inject 1 g into the vein. (Patient not taking: Reported on 10/28/2020)  . [DISCONTINUED] daptomycin (CUBICIN) IVPB Inject into the vein. (Patient not taking: Reported on 10/28/2020)   No facility-administered encounter medications on file as of 10/28/2020.     Review of Systems  Review of Systems  N/a  Physical Exam  BP 134/88 (  BP Location: Left Arm, Cuff Size: Normal)   Pulse 83   Temp 98.5 F (36.9 C) (Temporal)   Ht 5' 8"  (1.727 m)   Wt (!) 306 lb (138.8 kg)   SpO2 91%   BMI 46.53 kg/m   Wt Readings from Last 5 Encounters:  10/28/20 (!) 306 lb (138.8 kg)  09/29/20 (!) 302 lb (137 kg)  09/15/20 296 lb (134.3 kg)  09/09/20 299 lb 3.2 oz (135.7 kg)  07/01/20 285 lb (129.3 kg)    BMI Readings from Last 5 Encounters:  10/28/20 46.53 kg/m  09/29/20 44.60 kg/m  09/15/20 44.35 kg/m  09/09/20 45.49 kg/m  07/01/20 43.33 kg/m     Physical Exam General: Sitting in exam chair, in NAD Eyes: EOMI, no icterus Neck: Linear scars over right neck, tracheostomy site is closed with well healing scar Respiratory: CTAB, good air movement throughout Cardiovascular: RRR, no murmurs Abdomen: BS present, non tender MSK: Wearing orthopedic boots, bilateral feet Psych: Normal mood, full affect   Assessment & Plan:   [name] is a 45 y.o. man with history of asthma and extensive ~4 month hospitalization with COVID19  ARDS including 2 months on VV ECMO with tracheostomy s/p decannulation whom we are seeing in follow-up of respiratory illness.  Shortness of breath/DOE: Likely a sequelae of ARDS and subsequent evidence of DAD on CT scans and evolving lung injury. Likely component of NM weakness and deconditioning given prolonged hospital stay and known critical illness myopathy.  Symptoms are slowly improved but persist.  Largely deconditioning now as he is unable to really bear weight, exercise given ongoing foot ulcers, wounds that are slowly healing as a sequela of his prolonged ICU stay.  PFTs today with moderate restriction, mild DLCO reduction.  Overall relatively encouraging in severity of disease.  Recommend pulmonary rehab as soon as he can ambulate, bear weight on bilateral feet.  Want to be aggressive and continuing rehabilitation for him as his desires to get back to work.  Asthma: Was well controlled prior.  Using Symbicort twice daily, symptoms seem well controlled, hard to assess dyspnea in the setting of his extreme deconditioning.   Return in about 3 months (around 01/28/2021).   Lanier Clam, MD 10/29/2020   I spent 34 minutes in patient care for this visit including face-to-face visit, review of records, coordination of care.

## 2020-10-30 ENCOUNTER — Other Ambulatory Visit: Payer: Self-pay

## 2020-10-30 ENCOUNTER — Encounter (HOSPITAL_BASED_OUTPATIENT_CLINIC_OR_DEPARTMENT_OTHER): Payer: BC Managed Care – PPO | Admitting: Internal Medicine

## 2020-10-30 DIAGNOSIS — L89893 Pressure ulcer of other site, stage 3: Secondary | ICD-10-CM | POA: Diagnosis not present

## 2020-10-30 DIAGNOSIS — Z8616 Personal history of COVID-19: Secondary | ICD-10-CM | POA: Diagnosis not present

## 2020-10-30 DIAGNOSIS — L89620 Pressure ulcer of left heel, unstageable: Secondary | ICD-10-CM | POA: Diagnosis not present

## 2020-11-02 NOTE — Progress Notes (Signed)
SHAKEEL, DISNEY ([id number]) Visit Report for 10/30/2020 Cellular or Tissue Based Product Details Patient Name: Date of Service: [name] [place]. 10/30/2020 9:45 A M Medical Record Number: [id number] Patient Account Number: [id number] Date of Birth/Sex: Treating RN: March 01, 1976 (45 y.o. [name] Primary Care Provider: [name] Other Clinician: Referring Provider: Treating Provider/Extender: [name] in Treatment: 15 Cellular or Tissue Based Product Type Wound #1 Right Calcaneus Applied to: Performed By: Physician Ricard Dillon., MD Cellular or Tissue Based Product Type: Oasis tri-layer Level of Consciousness (Pre-procedure): Awake and Alert Pre-procedure Verification/Time Out Yes - 11:10 Taken: Location: genitalia / hands / feet / multiple digits Wound Size (sq cm): 6 Product Size (sq cm): 17 Waste Size (sq cm): 0 Amount of Product Applied (sq cm): 17 Instrument Used: Forceps, Scissors Lot #: [id number] Expiration Date: 12/08/2021 Reconstituted: Yes Solution Type: Saline Solution Amount: 5cc Lot #: [id number] Solution Expiration Date: 02/05/2022 Secured: Yes Secured With: Steri-Strips Dressing Applied: Yes Primary Dressing: Adaptic Response to Treatment: Procedure was tolerated well Level of Consciousness (Post- Awake and Alert procedure): Post Procedure Diagnosis Same as Pre-procedure Electronic Signature(s) Signed: 11/02/2020 9:50:14 AM By: Linton Ham MD Entered By: Linton Ham on 10/30/2020 11:49:31 -------------------------------------------------------------------------------- Cellular or Tissue Based Product Details Patient Name: Date of Service: [name], [place]. 10/30/2020 9:45 A M Medical Record Number: [id number] Patient Account Number: [id number] Date of Birth/Sex: Treating RN: [birth year] (45 y.o. [name] Primary Care Provider: [name] Other Clinician: Referring Provider: Treating Provider/Extender:  [name] in Treatment: 15 Cellular or Tissue Based Product Type Wound #2 Left Calcaneus Applied to: Performed By: Physician Ricard Dillon., MD Cellular or Tissue Based Product Type: Oasis tri-layer Level of Consciousness (Pre-procedure): Awake and Alert Pre-procedure Verification/Time Out Yes - 11:10 Taken: Location: genitalia / hands / feet / multiple digits Wound Size (sq cm): 7.99 Product Size (sq cm): 18 Waste Size (sq cm): 0 Amount of Product Applied (sq cm): 18 Instrument Used: Forceps, Scissors Lot #: [id number] Expiration Date: 12/08/2021 Reconstituted: Yes Solution Type: Saline Solution Amount: 5cc Lot #: [id number] Solution Expiration Date: 02/05/2022 Secured: Yes Secured With: Steri-Strips Dressing Applied: Yes Primary Dressing: Adaptic Response to Treatment: Procedure was tolerated well Level of Consciousness (Post- Awake and Alert procedure): Post Procedure Diagnosis Same as Pre-procedure Electronic Signature(s) Signed: 11/02/2020 9:50:14 AM By: Linton Ham MD Entered By: Linton Ham on 10/30/2020 11:49:40 -------------------------------------------------------------------------------- HPI Details Patient Name: Date of Service: [name], [place]. 10/30/2020 9:45 A M Medical Record Number: [id number] Patient Account Number: [id number] Date of Birth/Sex: Treating RN: Mar 30, 1976 (45 y.o. [name] Primary Care Provider: [name] Other Clinician: Referring Provider: Treating Provider/Extender: [name] in Treatment: 15 History of Present Illness HPI Description: 07/15/2020 upon evaluation today patient presents for initial inspection here in our clinic concerning issues he has been having with the bottoms of his feet bilaterally. He states these actually occurred as wounds when he was hospitalized for 5 months secondary to Covid. He was apparently with tilting bed where he was in an upright  position quite frequently and apparently this occurred in some way shape or form during that time. Fortunately there is no sign of active infection at this time. No fevers, chills, nausea, vomiting, or diarrhea. With that being said he still has substantial wounds on the plantar aspects of his feet Theragen require quite a bit of work to get these to heal. He has been  using Santyl currently though that is been problematic both in receiving the medication as well as actually paid for it as it is become quite expensive. Prior to the experience with Covid the patient really did not have any major medical problems other than hypertension he does have some mild generalized weakness following the Covid experience. 07/22/2020 on evaluation today patient appears to be doing okay in regard to his foot ulcers I feel like the wound beds are showing signs of better improvement that I do believe the Iodoflex is helping in this regard. With that being said he does have a lot of drainage currently and this is somewhat blue/green in nature which is consistent with Pseudomonas. I do think a culture today would be appropriate for [place] to evaluate and see if that is indeed the case I would likely start him on antibiotic orally as well he is not allergic to Cipro knows of no issues he has had in the past 12/21; patient was admitted to the clinic earlier this month with bilateral presumed pressure ulcers on the bottom of his feet apparently related to excessive pressure from a tilt table arrangement in the intensive care unit. Patient relates this to being on ECMO but I am not really sure that is exactly related to that. I must say I have never seen anything like this. He has fairly extensive full-thickness wounds extending from his heel towards his midfoot mostly centered laterally. There is already been some healing distally. He does not appear to have an arterial issue. He has been using gentamicin to the wound surfaces  with Iodoflex to help with ongoing debridement 1/6; this is a patient with pressure ulcers on the bottom of his feet related to excessive pressure from a standing position in the intensive care unit. He is complaining of a lot of pain in the right heel. He is not a diabetic. 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 We have been using Iodoflex to help prepare the surfaces of both wounds for an advanced treatment product. He is nonambulatory spending most of his time in a wheelchair I have asked him not to propel the wheelchair with his heels 1/13; in general his wounds look better not much surface area change we have been using Iodoflex as of last week. I did an x-ray of the right heel as the patient was complaining of pain. I had some thoughts about a stress fracture perhaps Achilles tendon problems however what it showed was erosive changes along the inferior aspect of the calcaneus he now has a MRI booked for 1/20. 1/20; in general his wounds continue to be better. Some improvement in the large narrow areas proximally in his foot. He is still complaining of pain in the right heel and tenderness in certain areas of this wound. His MRI is tonight. I am not just looking for osteomyelitis that was brought up on the x-ray I am wondering about stress fractures, tendon ruptures etc. He has no such findings on the left. Also noteworthy is that the patient had critical illness neuropathy and some of the discomfort may be actual improvement in nerve function I am just not sure. These wounds were initially in the setting of severe critical illness related to COVID-19. He was put in a standing position. He may have also been on pressors at the point contributing to tissue ischemia. By his description at some point these wounds were grossly necrotic extending proximally up into the Achilles part of his  heel. I do not know that I have ever really seen pictures of them like this although  they may exist in epic We have ordered Tri layer Oasis. I am trying to stimulate some granulation in these areas. This is of course assuming the MRI is negative for infection 1/27; since the patient was last here he saw Dr. Juleen China of infectious disease. He is planned for vancomycin and ceftriaxone. Prior operative culture grew MSSA. Also ordered baseline lab work. He also ordered arterial studies although the ABIs in our clinic were normal as well as his clinical exam these were normal I do not think he needs to see vascular surgery. His ABIs at the PTA were 1.22 in the right triphasic waveforms with a normal TBI of 1.15 on the left ABI of 1.22 with triphasic waveforms and a normal TBI of 1.08. Finally he saw Dr. Amalia Hailey who will follow him in for 2 months. At this point I do not think he felt that he needed a procedure on the right calcaneal bone. Dr. Juleen China is elected for broad-spectrum antibiotic The patient is still having pain in the right heel. He walks with a walker 2/3; wounds are generally smaller. He is tolerating his IV antibiotics. I believe this is vancomycin and ceftriaxone. We are still waiting for Oasis burn in terms of his out-of-pocket max which he should be meeting soon given the IV antibiotics, MRIs etc. I have asked him to check in on this. We are using silver collagen in the meantime the wounds look better 2/10; tolerating IV vancomycin and Rocephin. We are waiting to apply for Oasis. Although I am not really sure where he is in his out-of-pocket max. 2/17 started the first application of Oasis trilayer. Still on antibiotics. The wounds have generally look better. The area on the left has a little more surface slough requiring debridement 0/76; second application of Oasis trilayer. The wound surface granulation is generally look better. The area on the left with undermining laterally I think is come in a bit. 10/08/2020 upon evaluation today patient is here today for [employer] application #3. Fortunately he seems to be doing extremely well with regard to this and we are seeing a lot of new epithelial growth which is great news. Fortunately there is no signs of active infection at this time. 10/16/2020 upon evaluation today patient appears to be doing well with regard to his foot ulcers. Do believe the Oasis has been of benefit for him. I do not see any signs of infection right now which is great news and I think that he has a lot of new epithelial growth which is great to see as well. The patient is very pleased to hear all of this. I do think we can proceed with the Oasis trilayer #4 today. 3/18; not as much improvement in these areas on his heels that I was hoping. I did reapply trilateral Oasis today the tissue looks healthier but not as much fill in as I was hoping. 3/25; better looking today I think this is come in a bit the tissue looks healthier. Trial her Oasis reapplied [employer]) Signed: 11/02/2020 9:50:14 AM By: Linton Ham MD Entered By: Linton Ham on 10/30/2020 11:50:06 -------------------------------------------------------------------------------- Physical Exam Details Patient Name: Date of Service: [name], [name] 10/30/2020 9:45 A M Medical Record Number: [id number] Patient Account Number: [id number] Date of Birth/Sex: Treating RN: 1976/03/15 (45 y.o. [name] Primary Care Provider: [name] Other Clinician: Referring Provider:  Treating Provider/Extender: [name] in Treatment: 15 Constitutional Patient is hypotensive.. Supine Blood Pressure is within target range for patient.. Pulse regular and within target range for patient.Marland Kitchen Respirations regular, non- labored and within target range.Marland Kitchen Appears in no distress. Notes Wound exam; on both sides the tissue looks better. I think there is been some contraction. Electronic Signature(s) Signed: 11/02/2020 9:50:14 AM By: Linton Ham MD Entered By: Linton Ham on 10/30/2020 11:50:47 -------------------------------------------------------------------------------- Physician Orders Details Patient Name: Date of Service: [name], [place]. 10/30/2020 9:45 A M Medical Record Number: [id number] Patient Account Number: [id number] Date of Birth/Sex: Treating RN: 1975-12-24 (45 y.o. [name] Primary Care Provider: [name] Other Clinician: Referring Provider: Treating Provider/Extender: [name] in Treatment: 15 Verbal / Phone Orders: No Diagnosis Coding ICD-10 Coding Code Description [phone] Pressure ulcer of other site, stage 3 M62.81 Muscle weakness (generalized) I10 Essential (primary) hypertension L97.512 Non-pressure chronic ulcer of other part of right foot with fat layer exposed L97.522 Non-pressure chronic ulcer of other 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 Follow-up Appointments Return Appointment in 1 week. Cellular or Tissue Based Products Wound #1 Right Calcaneus Cellular or Tissue Based Product Type: - Oasis Trilayer to both wounds #5 10/23/20, #6 10/30/20 daptic or Mepitel. (DO NOT REMOVE). - Cellular or Tissue Based Product applied to wound bed, secured with steri-strips, cover with A ***Apply drawtex under adaptic, then 4x4 gauze, ABD pad secured with roll gauze and ACE wrap.** Wound #2 Left Calcaneus Cellular or Tissue Based Product Type: - Oasis Trilayer to both wounds #6 daptic or Mepitel. (DO NOT REMOVE). - Cellular or Tissue Based Product applied to wound bed, secured with steri-strips, cover with A ***Apply drawtex under adaptic, then 4x4 gauze, ABD pad secured with roll gauze and ACE wrap.** Bathing/ Shower/ Hygiene May shower with protection but do not get wound dressing(s) wet. Edema Control - Lymphedema / SCD / Other Bilateral Lower Extremities Elevate legs to the level of the heart or above for 30 minutes daily and/or when sitting, a  frequency of: Avoid standing for long periods of time. Off-Loading Wedge shoe to: - Gloped to bilateral feet Other: - keep pressure off of the bottom of your feet Wound Treatment Wound #1 - Calcaneus Wound Laterality: Right Secondary Dressing: Woven Gauze Sponge, Non-Sterile 4x4 in (Generic) 3 x Per Week/30 Days Discharge Instructions: ensure to fold and apply up to the Collagen to ensure it keeps the Collagen in place next to wound bed. Apply over primary dressing as directed. Secondary Dressing: ABD Pad, 5x9 3 x Per Week/30 Days Discharge Instructions: Apply over primary dressing as directed. Secured With: Elastic Bandage 4 inch (ACE bandage) 3 x Per Week/30 Days Discharge Instructions: Secure with ACE bandage as directed. Secured With: [employer], 4.5x3.1 (in/yd) (Generic) 3 x Per Week/30 Days Discharge Instructions: Secure with Kerlix as directed. Secured With: Paper Tape, 2x10 (in/yd) (Generic) 3 x Per Week/30 Days Discharge Instructions: Secure dressing with tape as directed. Wound #2 - Calcaneus Wound Laterality: Left Secondary Dressing: Woven Gauze Sponge, Non-Sterile 4x4 in (Generic) 3 x Per Week/30 Days Discharge Instructions: ensure to fold and apply up to the Collagen to ensure it keeps the Collagen in place next to wound bed. Apply over primary dressing as directed. Secondary Dressing: ABD Pad, 5x9 3 x Per Week/30 Days Discharge Instructions: Apply over primary dressing as directed. Secured With: Elastic Bandage 4 inch (ACE bandage) 3 x Per Week/30 Days Discharge  Instructions: Secure with ACE bandage as directed. Secured With: [employer], 4.5x3.1 (in/yd) (Generic) 3 x Per Week/30 Days Discharge Instructions: Secure with Kerlix as directed. Secured With: Paper Tape, 2x10 (in/yd) (Generic) 3 x Per Week/30 Days Discharge Instructions: Secure dressing with tape as directed. Electronic Signature(s) Signed: 10/30/2020 6:16:52 PM By: Lorrin Jackson Signed:  11/02/2020 9:50:14 AM By: Linton Ham MD Previous Signature: 10/30/2020 10:42:34 AM Version By: Lorrin Jackson Entered By: Lorrin Jackson on 10/30/2020 11:25:28 -------------------------------------------------------------------------------- Problem List Details Patient Name: Date of Service: [name], [place] 10/30/2020 9:45 A M Medical Record Number: [id number] Patient Account Number: [id number] Date of Birth/Sex: Treating RN: November 10, 1975 (45 y.o. [name] Primary Care Provider: [name] Other Clinician: Referring Provider: Treating Provider/Extender: [name] in Treatment: 15 Active Problems ICD-10 Encounter Code Description Active Date MDM Diagnosis [phone] Pressure ulcer of other site, stage 3 07/15/2020 No Yes M62.81 Muscle weakness (generalized) 07/15/2020 No Yes I10 Essential (primary) hypertension 07/15/2020 No Yes L97.512 Non-pressure chronic ulcer of other part of right foot with fat layer exposed 09/03/2020 No Yes L97.522 Non-pressure chronic ulcer of other 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 09/03/2020 No Yes Inactive Problems ICD-10 Code Description Active Date Inactive Date M86.171 Other acute osteomyelitis, right ankle and foot 09/03/2020 09/03/2020 Resolved Problems Electronic Signature(s) Signed: 11/02/2020 9:50:14 AM By: Linton Ham MD Previous Signature: 10/30/2020 10:42:03 AM Version By: Lorrin Jackson Entered By: Linton Ham on 10/30/2020 11:49:15 -------------------------------------------------------------------------------- Progress Note Details Patient Name: Date of Service: [name], [place]. 10/30/2020 9:45 A M Medical Record Number: [id number] Patient Account Number: [id number] Date of Birth/Sex: Treating RN: 10/30/1975 (45 y.o. [name] Primary Care Provider: [name] Other Clinician: Referring Provider: Treating Provider/Extender: [name] in Treatment:  15 Subjective History of Present Illness (HPI) 07/15/2020 upon evaluation today patient presents for initial inspection here in our clinic concerning issues he has been having with the bottoms of his feet bilaterally. He states these actually occurred as wounds when he was hospitalized for 5 months secondary to Covid. He was apparently with tilting bed where he was in an upright position quite frequently and apparently this occurred in some way shape or form during that time. Fortunately there is no sign of active infection at this time. No fevers, chills, nausea, vomiting, or diarrhea. With that being said he still has substantial wounds on the plantar aspects of his feet Theragen require quite a bit of work to get these to heal. He has been using Santyl currently though that is been problematic both in receiving the medication as well as actually paid for it as it is become quite expensive. Prior to the experience with Covid the patient really did not have any major medical problems other than hypertension he does have some mild generalized weakness following the Covid experience. 07/22/2020 on evaluation today patient appears to be doing okay in regard to his foot ulcers I feel like the wound beds are showing signs of better improvement that I do believe the Iodoflex is helping in this regard. With that being said he does have a lot of drainage currently and this is somewhat blue/green in nature which is consistent with Pseudomonas. I do think a culture today would be appropriate for [place] to evaluate and see if that is indeed the case I would likely start him on antibiotic orally as well he is not allergic to Cipro knows of no issues he has had in  the past 12/21; patient was admitted to the clinic earlier this month with bilateral presumed pressure ulcers on the bottom of his feet apparently related to excessive pressure from a tilt table arrangement in the intensive care unit. Patient relates this to  being on ECMO but I am not really sure that is exactly related to that. I must say I have never seen anything like this. He has fairly extensive full-thickness wounds extending from his heel towards his midfoot mostly centered laterally. There is already been some healing distally. He does not appear to have an arterial issue. He has been using gentamicin to the wound surfaces with Iodoflex to help with ongoing debridement 1/6; this is a patient with pressure ulcers on the bottom of his feet related to excessive pressure from a standing position in the intensive care unit. He is complaining of a lot of pain in the right heel. He is not a diabetic. 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 We have been using Iodoflex to help prepare the surfaces of both wounds for an advanced treatment product. He is nonambulatory spending most of his time in a wheelchair I have asked him not to propel the wheelchair with his heels 1/13; in general his wounds look better not much surface area change we have been using Iodoflex as of last week. I did an x-ray of the right heel as the patient was complaining of pain. I had some thoughts about a stress fracture perhaps Achilles tendon problems however what it showed was erosive changes along the inferior aspect of the calcaneus he now has a MRI booked for 1/20. 1/20; in general his wounds continue to be better. Some improvement in the large narrow areas proximally in his foot. He is still complaining of pain in the right heel and tenderness in certain areas of this wound. His MRI is tonight. I am not just looking for osteomyelitis that was brought up on the x-ray I am wondering about stress fractures, tendon ruptures etc. He has no such findings on the left. Also noteworthy is that the patient had critical illness neuropathy and some of the discomfort may be actual improvement in nerve function I am just not sure. These wounds were initially in the  setting of severe critical illness related to COVID-19. He was put in a standing position. He may have also been on pressors at the point contributing to tissue ischemia. By his description at some point these wounds were grossly necrotic extending proximally up into the Achilles part of his heel. I do not know that I have ever really seen pictures of them like this although they may exist in epic We have ordered Tri layer Oasis. I am trying to stimulate some granulation in these areas. This is of course assuming the MRI is negative for infection 1/27; since the patient was last here he saw Dr. Juleen China of infectious disease. He is planned for vancomycin and ceftriaxone. Prior operative culture grew MSSA. Also ordered baseline lab work. He also ordered arterial studies although the ABIs in our clinic were normal as well as his clinical exam these were normal I do not think he needs to see vascular surgery. His ABIs at the PTA were 1.22 in the right triphasic waveforms with a normal TBI of 1.15 on the left ABI of 1.22 with triphasic waveforms and a normal TBI of 1.08. Finally he saw Dr. Amalia Hailey who will follow him in for 2 months. At this point I do  not think he felt that he needed a procedure on the right calcaneal bone. Dr. Juleen China is elected for broad-spectrum antibiotic The patient is still having pain in the right heel. He walks with a walker 2/3; wounds are generally smaller. He is tolerating his IV antibiotics. I believe this is vancomycin and ceftriaxone. We are still waiting for Oasis burn in terms of his out-of-pocket max which he should be meeting soon given the IV antibiotics, MRIs etc. I have asked him to check in on this. We are using silver collagen in the meantime the wounds look better 2/10; tolerating IV vancomycin and Rocephin. We are waiting to apply for Oasis. Although I am not really sure where he is in his out-of-pocket max. 2/17 started the first application of Oasis trilayer.  Still on antibiotics. The wounds have generally look better. The area on the left has a little more surface slough requiring debridement 1/49; second application of Oasis trilayer. The wound surface granulation is generally look better. The area on the left with undermining laterally I think is come in a bit. 10/08/2020 upon evaluation today patient is here today for [employer] application #3. Fortunately he seems to be doing extremely well with regard to this and we are seeing a lot of new epithelial growth which is great news. Fortunately there is no signs of active infection at this time. 10/16/2020 upon evaluation today patient appears to be doing well with regard to his foot ulcers. Do believe the Oasis has been of benefit for him. I do not see any signs of infection right now which is great news and I think that he has a lot of new epithelial growth which is great to see as well. The patient is very pleased to hear all of this. I do think we can proceed with the Oasis trilayer #4 today. 3/18; not as much improvement in these areas on his heels that I was hoping. I did reapply trilateral Oasis today the tissue looks healthier but not as much fill in as I was hoping. 3/25; better looking today I think this is come in a bit the tissue looks healthier. Trial her Oasis reapplied #6 Objective Constitutional Patient is hypotensive.. Supine Blood Pressure is within target range for patient.. Pulse regular and within target range for patient.Marland Kitchen Respirations regular, non- labored and within target range.Marland Kitchen Appears in no distress. Vitals Time Taken: 10:26 AM, Height: 69 in, Weight: 280 lbs, BMI: 41.3, Temperature: 98.7 F, Pulse: 80 bpm, Respiratory Rate: 17 breaths/min, Blood Pressure: 157/93 mmHg. General Notes: Wound exam; on both sides the tissue looks better. I think there is been some contraction. Integumentary (Hair, Skin) Wound #1 status is Open. Original cause of wound was Pressure Injury.  The date acquired was: 10/07/2019. The wound has been in treatment 15 weeks. The wound is located on the Right Calcaneus. The wound measures 4cm length x 1.5cm width x 0.3cm depth; 4.712cm^2 area and 1.414cm^3 volume. There is Fat Layer (Subcutaneous Tissue) exposed. There is no tunneling noted, however, there is undermining starting at 12:00 and ending at 12:00 with a maximum distance of 0.4cm. There is a medium amount of serosanguineous drainage noted. The wound margin is thickened. There is large (67-100%) pink, pale granulation within the wound bed. There is a small (1-33%) amount of necrotic tissue within the wound bed including Adherent Slough. Wound #2 status is Open. Original cause of wound was Pressure Injury. The date acquired was: 10/07/2019. The wound has been in treatment 15 weeks.  The wound is located on the Left Calcaneus. The wound measures 4.7cm length x 1.7cm width x 0.5cm depth; 6.275cm^2 area and 3.138cm^3 volume. There is Fat Layer (Subcutaneous Tissue) exposed. There is undermining starting at 12:00 and ending at 4:00 with a maximum distance of 1cm. There is a medium amount of serosanguineous drainage noted. The wound margin is thickened. There is medium (34-66%) pink granulation within the wound bed. There is a medium (34- 66%) amount of necrotic tissue within the wound bed including Adherent Slough. Assessment Active Problems ICD-10 Pressure ulcer of other site, stage 3 Muscle weakness (generalized) Essential (primary) hypertension Non-pressure chronic ulcer of other part of right foot with fat layer exposed Non-pressure chronic ulcer of other 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 Procedures Wound #1 Pre-procedure diagnosis of Wound #1 is a Pressure Ulcer located on the Right Calcaneus. A skin graft procedure using a bioengineered skin substitute/cellular or tissue based product was performed by Ricard Dillon., MD with the following instrument(s): Forceps and Scissors.  Oasis tri-layer was applied and secured with Steri-Strips. 17 sq cm of product was utilized and 0 sq cm was wasted. Post Application, Adaptic was applied. A Time Out was conducted at 11:10, prior to the start of the procedure. The procedure was tolerated well. Post procedure Diagnosis Wound #1: Same as Pre-Procedure . Wound #2 Pre-procedure diagnosis of Wound #2 is a Pressure Ulcer located on the Left Calcaneus. A skin graft procedure using a bioengineered skin substitute/cellular or tissue based product was performed by Ricard Dillon., MD with the following instrument(s): Forceps and Scissors. Oasis tri-layer was applied and secured with Steri-Strips. 18 sq cm of product was utilized and 0 sq cm was wasted. Post Application, Adaptic was applied. A Time Out was conducted at 11:10, prior to the start of the procedure. The procedure was tolerated well. Post procedure Diagnosis Wound #2: Same as Pre-Procedure . Plan Follow-up Appointments: Return Appointment in 1 week. Cellular or Tissue Based Products: Wound #1 Right Calcaneus: Cellular or Tissue Based Product Type: - Oasis Trilayer to both wounds #5 10/23/20, #6 10/30/20 Cellular or Tissue Based Product applied to wound bed, secured with steri-strips, cover with Adaptic or Mepitel. (DO NOT REMOVE). - ***Apply drawtex under adaptic, then 4x4 gauze, ABD pad secured with roll gauze and ACE wrap.** Wound #2 Left Calcaneus: Cellular or Tissue Based Product Type: - Oasis Trilayer to both wounds #6 Cellular or Tissue Based Product applied to wound bed, secured with steri-strips, cover with Adaptic or Mepitel. (DO NOT REMOVE). - ***Apply drawtex under adaptic, then 4x4 gauze, ABD pad secured with roll gauze and ACE wrap.** Bathing/ Shower/ Hygiene: May shower with protection but do not get wound dressing(s) wet. Edema Control - Lymphedema / SCD / Other: Elevate legs to the level of the heart or above for 30 minutes daily and/or when sitting, a  frequency of: Avoid standing for long periods of time. Off-Loading: Wedge shoe to: - Gloped to bilateral feet Other: - keep pressure off of the bottom of your feet WOUND #1: - Calcaneus Wound Laterality: Right Secondary Dressing: Woven Gauze Sponge, Non-Sterile 4x4 in (Generic) 3 x Per Week/30 Days Discharge Instructions: ensure to fold and apply up to the Collagen to ensure it keeps the Collagen in place next to wound bed. Apply over primary dressing as directed. Secondary Dressing: ABD Pad, 5x9 3 x Per Week/30 Days Discharge Instructions: Apply over primary dressing as directed. Secured With: Elastic Bandage 4 inch (ACE bandage) 3 x Per  Week/30 Days Discharge Instructions: Secure with ACE bandage as directed. Secured With: [employer], 4.5x3.1 (in/yd) (Generic) 3 x Per Week/30 Days Discharge Instructions: Secure with Kerlix as directed. Secured With: Paper T ape, 2x10 (in/yd) (Generic) 3 x Per Week/30 Days Discharge Instructions: Secure dressing with tape as directed. WOUND #2: - Calcaneus Wound Laterality: Left Secondary Dressing: Woven Gauze Sponge, Non-Sterile 4x4 in (Generic) 3 x Per Week/30 Days Discharge Instructions: ensure to fold and apply up to the Collagen to ensure it keeps the Collagen in place next to wound bed. Apply over primary dressing as directed. Secondary Dressing: ABD Pad, 5x9 3 x Per Week/30 Days Discharge Instructions: Apply over primary dressing as directed. Secured With: Elastic Bandage 4 inch (ACE bandage) 3 x Per Week/30 Days Discharge Instructions: Secure with ACE bandage as directed. Secured With: [employer], 4.5x3.1 (in/yd) (Generic) 3 x Per Week/30 Days Discharge Instructions: Secure with Kerlix as directed. Secured With: Paper T ape, 2x10 (in/yd) (Generic) 3 x Per Week/30 Days Discharge Instructions: Secure dressing with tape as directed. #1 Oasis #6 applied follow-up in 1 week Electronic Signature(s) Signed: 11/02/2020 9:50:14 AM By:  Linton Ham MD Entered By: Linton Ham on 10/30/2020 11:51:38 -------------------------------------------------------------------------------- SuperBill Details Patient Name: Date of Service: [name], [place]. 10/30/2020 Medical Record Number: [id number] Patient Account Number: [id number] Date of Birth/Sex: Treating RN: [birth year] (45 y.o. [name] Primary Care Provider: [name] Other Clinician: Referring Provider: Treating Provider/Extender: [name] in Treatment: 15 Diagnosis Coding ICD-10 Codes Code Description [phone] Pressure ulcer of other site, stage 3 M62.81 Muscle weakness (generalized) I10 Essential (primary) hypertension L97.512 Non-pressure chronic ulcer of other part of right foot with fat layer exposed L97.522 Non-pressure chronic ulcer of other 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 Facility Procedures CPT4 Code: [id number] Description: K0254 Oasis Ultra per sq CM ICD-10 Diagnosis Description L97.512 Non-pressure chronic ulcer of other part of right foot with fat layer exposed L97.522 Non-pressure chronic ulcer of other 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 Modifier: Quantity: 23 CPT4 Code: [id number] I Description: 28315 - SKIN SUB GRAFT FACE/NK/HF/G CD-10 Diagnosis Description L97.512 Non-pressure chronic ulcer of other part of right foot with fat layer exposed L97.522 Non-pressure chronic ulcer of other 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 Modifier: Quantity: 1 Physician Procedures : CPT4 Code Description Modifier [id number] - WC PHYS SKIN SUB GRAFT FACE/NK/HF/G ICD-10 Diagnosis Description L97.512 Non-pressure chronic ulcer of other part of right foot with fat layer exposed L97.522 Non-pressure chronic ulcer of other part of  left foot with fat layer exposed Quantity: 1 Electronic Signature(s) Signed: 11/02/2020 9:50:14 AM By: Linton Ham MD Entered By: Linton Ham on 10/30/2020 11:51:58

## 2020-11-03 ENCOUNTER — Other Ambulatory Visit: Payer: Self-pay | Admitting: *Deleted

## 2020-11-03 NOTE — Progress Notes (Deleted)
Subjective:    Patient ID: [name], male    DOB: [birth year], 45 y.o.   MRN: [id number]  HPI  Pain Inventory Average Pain 3 Pain Right Now 3 My pain is intermittent and sharp  In the last 24 hours, has pain interfered with the following? General activity 5 Relation with others 5 Enjoyment of life 5 What TIME of day is your pain at its worst? morning  Sleep (in general) Fair  Pain is worse with: walking and standing Pain improves with: rest and medication Relief from Meds: 6  Family History  Problem Relation Age of Onset  . Asthma Brother   . Diabetes Father   . Hypertension Father   . Diabetes Paternal Grandmother   . Hypertension Paternal Grandmother   . Migraines Mother   . GER disease Mother   . Pancreatic cancer Maternal Grandmother   . Heart disease Maternal Grandfather    Social History   Socioeconomic History  . Marital status: Married    Spouse name: Not on file  . Number of children: 1  . Years of education: Not on file  . Highest education level: Not on file  Occupational History  . Occupation: delivery driver  Tobacco Use  . Smoking status: Never Smoker  . Smokeless tobacco: Never Used  Vaping Use  . Vaping Use: Never used  Substance and Sexual Activity  . Alcohol use: Yes    Comment: rarely 2-3 times a year  . Drug use: No  . Sexual activity: Yes    Partners: Female  Other Topics Concern  . Not on file  Social History Narrative  . Not on file   Social Determinants of Health   Financial Resource Strain: Not on file  Food Insecurity: Not on file  Transportation Needs: Not on file  Physical Activity: Not on file  Stress: Not on file  Social Connections: Not on file   Past Surgical History:  Procedure Laterality Date  . CANNULATION FOR ECMO (EXTRACORPOREAL MEMBRANE OXYGENATION) N/A 08/28/2019   Procedure: CANNULATION FOR VV ECMO (EXTRACORPOREAL MEMBRANE OXYGENATION);  Surgeon: Prescott Gum, Collier Salina, MD;  Location: [place];  Service: Open  Heart Surgery;  Laterality: N/A;  CRESCENT CANNULA  . CANNULATION FOR ECMO (EXTRACORPOREAL MEMBRANE OXYGENATION) N/A 09/10/2019   Procedure: CANNULATION FOR ECMO (EXTRACORPOREAL MEMBRANE OXYGENATION) PUTTING IN CRESCENT 32FR CANNULA  AND REMOVING GROING CANNULATION;  Surgeon: Wonda Olds, MD;  Location: [place];  Service: Open Heart Surgery;  Laterality: N/A;  PUTTING IN CRESCENT/REMOVING GROIN CANNULATION  . CYSTOSCOPY/URETEROSCOPY/HOLMIUM LASER/STENT PLACEMENT Left 04/18/2019   Procedure: LEFT URETEROSCOPY/HOLMIUM LASER/STENT PLACEMENT;  Surgeon: Ardis Hughs, MD;  Location[facility];  Service: Urology;  Laterality: Left;  . CYSTOSCOPY/URETEROSCOPY/HOLMIUM LASER/STENT PLACEMENT Left 05/02/2019   Procedure: CYSTOSCOPY/URETEROSCOPY/HOLMIUM LASER/STENT EXCHANGE;  Surgeon: Ardis Hughs, MD;  [facility];  Service: Urology;  Laterality: Left;  . ECMO CANNULATION N/A 08/03/2019   Procedure: ECMO CANNULATION;  Surgeon: Wonda Olds, MD;  Location: [place] [facility];  Service: Cardiovascular;  Laterality: N/A;  . ESOPHAGOGASTRODUODENOSCOPY N/A 09/11/2019   Procedure: ESOPHAGOGASTRODUODENOSCOPY (EGD);  Surgeon: Wonda Olds, MD;  Location[facility];  Service: Thoracic;  Laterality: N/A;  . GRAFT APPLICATION Bilateral 9/45/8592   Procedure: APPLICATION OF SKIN GRAFT BILATERAL FEET;  Surgeon: Edrick Kins, DPM;  Location: [facility];  Service: Podiatry;  Laterality: Bilateral;  . IR REPLC GASTRO/COLONIC TUBE PERCUT W/FLUORO  10/14/2019  . IRRIGATION AND DEBRIDEMENT SHOULDER Right 09/29/2017   Procedure:  IRRIGATION AND DEBRIDEMENT SHOULDER;  Surgeon: Leandrew Koyanagi, MD;  Location: [place];  Service: Orthopedics;  Laterality: Right;  . [place] SURGERY  2002  . NASAL ENDOSCOPY WITH EPISTAXIS CONTROL N/A 08/31/2019   Procedure: NASAL ENDOSCOPY WITH EPISTAXIS CONTROL WITH CAUTERIZATION;  Surgeon: Melida Quitter, MD;  Location: [place];  Service: ENT;  Laterality: N/A;  .  PORTACATH PLACEMENT N/A 09/11/2019   Procedure: PEG TUBE INSERTION - BEDSIDE;  Surgeon: Wonda Olds, MD;  Location: [place];  Service: Thoracic;  Laterality: N/A;  . TEE WITHOUT CARDIOVERSION N/A 08/28/2019   Procedure: TRANSESOPHAGEAL ECHOCARDIOGRAM (TEE);  Surgeon: Prescott Gum, Collier Salina, MD;  Location: [place];  Service: Open Heart Surgery;  Laterality: N/A;  . TEE WITHOUT CARDIOVERSION N/A 09/10/2019   Procedure: TRANSESOPHAGEAL ECHOCARDIOGRAM (TEE);  Surgeon: Wonda Olds, MD;  Location: [place];  Service: Open Heart Surgery;  Laterality: N/A;  . WOUND DEBRIDEMENT Bilateral 03/27/2020   Procedure: EXCISIONAL DEBRIDEMENT OF ULCERS BILATERAL FEET;  Surgeon: Edrick Kins, DPM;  [facility];  Service: Podiatry;  Laterality: Bilateral;   Past Surgical History:  Procedure Laterality Date  . CANNULATION FOR ECMO (EXTRACORPOREAL MEMBRANE OXYGENATION) N/A 08/28/2019   Procedure: CANNULATION FOR VV ECMO (EXTRACORPOREAL MEMBRANE OXYGENATION);  Surgeon: Prescott Gum, Collier Salina, MD;  Location: [place];  Service: Open Heart Surgery;  Laterality: N/A;  CRESCENT CANNULA  . CANNULATION FOR ECMO (EXTRACORPOREAL MEMBRANE OXYGENATION) N/A 09/10/2019   Procedure: CANNULATION FOR ECMO (EXTRACORPOREAL MEMBRANE OXYGENATION) PUTTING IN CRESCENT 32FR CANNULA  AND REMOVING GROING CANNULATION;  Surgeon: Wonda Olds, MD;  Location: [place];  Service: Open Heart Surgery;  Laterality: N/A;  PUTTING IN CRESCENT/REMOVING GROIN CANNULATION  . CYSTOSCOPY/URETEROSCOPY/HOLMIUM LASER/STENT PLACEMENT Left 04/18/2019   Procedure: LEFT URETEROSCOPY/HOLMIUM LASER/STENT PLACEMENT;  Surgeon: Ardis Hughs, MD;  Location: [facility];  Service: Urology;  Laterality: Left;  . CYSTOSCOPY/URETEROSCOPY/HOLMIUM LASER/STENT PLACEMENT Left 05/02/2019   Procedure: CYSTOSCOPY/URETEROSCOPY/HOLMIUM LASER/STENT EXCHANGE;  Surgeon: Ardis Hughs, MD;  [facility];  Service: Urology;  Laterality: Left;  . ECMO CANNULATION N/A  08/03/2019   Procedure: ECMO CANNULATION;  Surgeon: Wonda Olds, MD;  Location: [place] [facility];  Service: Cardiovascular;  Laterality: N/A;  . ESOPHAGOGASTRODUODENOSCOPY N/A 09/11/2019   Procedure: ESOPHAGOGASTRODUODENOSCOPY (EGD);  Surgeon: Wonda Olds, MD;  [facility];  Service: Thoracic;  Laterality: N/A;  . GRAFT APPLICATION Bilateral 5/88/5027   Procedure: APPLICATION OF SKIN GRAFT BILATERAL FEET;  Surgeon: Edrick Kins, DPM;  [facility];  Service: Podiatry;  Laterality: Bilateral;  . IR REPLC GASTRO/COLONIC TUBE PERCUT W/FLUORO  10/14/2019  . IRRIGATION AND DEBRIDEMENT SHOULDER Right 09/29/2017   Procedure: IRRIGATION AND DEBRIDEMENT SHOULDER;  Surgeon: Leandrew Koyanagi, MD;  Location: [place];  Service: Orthopedics;  Laterality: Right;  . [place] SURGERY  2002  . NASAL ENDOSCOPY WITH EPISTAXIS CONTROL N/A 08/31/2019   Procedure: NASAL ENDOSCOPY WITH EPISTAXIS CONTROL WITH CAUTERIZATION;  Surgeon: Melida Quitter, MD;  Location: [place];  Service: ENT;  Laterality: N/A;  . PORTACATH PLACEMENT N/A 09/11/2019   Procedure: PEG TUBE INSERTION - BEDSIDE;  Surgeon: Wonda Olds, MD;  Location: [place];  Service: Thoracic;  Laterality: N/A;  . TEE WITHOUT CARDIOVERSION N/A 08/28/2019   Procedure: TRANSESOPHAGEAL ECHOCARDIOGRAM (TEE);  Surgeon: Prescott Gum, Collier Salina, MD;  Location: [place];  Service: Open Heart Surgery;  Laterality: N/A;  . TEE WITHOUT CARDIOVERSION N/A 09/10/2019   Procedure: TRANSESOPHAGEAL ECHOCARDIOGRAM (TEE);  Surgeon: Wonda Olds, MD;  Location:  [place] [facility];  Service: Open Heart Surgery;  Laterality: N/A;  . WOUND DEBRIDEMENT Bilateral 03/27/2020   Procedure: EXCISIONAL DEBRIDEMENT OF ULCERS BILATERAL FEET;  Surgeon: Edrick Kins, DPM;  [facility];  Service: Podiatry;  Laterality: Bilateral;   Past Medical History:  Diagnosis Date  . Anginal pain ([place])    with covid  . Anxiety   . Asthma   . Dyspnea   . GERD (gastroesophageal reflux disease)   .  Headache   . History of kidney stones    LEFT URETERAL STONE  . HTN (hypertension)   . Pancreatitis 2018   GALLBALDDER SLUDGE CAUSED ISSUED RESOLVED  . Pneumonia 07/2019   covid   There were no vitals taken for this visit.  Opioid Risk Score:   Fall Risk Score:  `1  Depression screen PHQ 2/9  Depression screen [facility] 2/9 10/15/2020 09/09/2020 07/01/2020 04/15/2020 01/03/2020  Decreased Interest 0 1 0 - 0  Down, Depressed, Hopeless 1 1 0 - 1  PHQ - 2 Score 1 2 0 - 1  Altered sleeping - - - - 1  Tired, decreased energy - - - - 2  Change in appetite - - - 1 0  Feeling bad or failure about yourself  - - - 1 1  Trouble concentrating - - - - 0  Moving slowly or fidgety/restless - - - - 1  Suicidal thoughts - - - - 0  PHQ-9 Score - - - - 6  Difficult doing work/chores - - - - Somewhat difficult  Some recent data might be hidden    Review of Systems  Constitutional: Negative.   HENT: Negative.   Eyes: Negative.   Respiratory: Negative.   Cardiovascular: Negative.   Gastrointestinal: Negative.   Endocrine: Negative.   Genitourinary: Negative.   Musculoskeletal: Positive for back pain and gait problem.  Skin: Negative.   Allergic/Immunologic: Negative.   Hematological: Negative.   Psychiatric/Behavioral: Negative.   All other systems reviewed and are negative.      Objective:   Physical Exam        Assessment & Plan:

## 2020-11-04 ENCOUNTER — Encounter: Payer: Self-pay | Admitting: Physical Medicine and Rehabilitation

## 2020-11-04 ENCOUNTER — Other Ambulatory Visit: Payer: Self-pay

## 2020-11-04 ENCOUNTER — Encounter
Payer: BC Managed Care – PPO | Attending: Physical Medicine and Rehabilitation | Admitting: Physical Medicine and Rehabilitation

## 2020-11-04 VITALS — BP 155/81 | HR 81 | Temp 98.2°F

## 2020-11-04 DIAGNOSIS — U099 Post covid-19 condition, unspecified: Secondary | ICD-10-CM | POA: Diagnosis not present

## 2020-11-04 DIAGNOSIS — R0609 Other forms of dyspnea: Secondary | ICD-10-CM | POA: Diagnosis not present

## 2020-11-04 DIAGNOSIS — Z6841 Body Mass Index (BMI) 40.0 and over, adult: Secondary | ICD-10-CM | POA: Insufficient documentation

## 2020-11-04 DIAGNOSIS — G6281 Critical illness polyneuropathy: Secondary | ICD-10-CM | POA: Insufficient documentation

## 2020-11-04 MED ORDER — HYDROCODONE-ACETAMINOPHEN 5-325 MG PO TABS: 1.0000 | ORAL_TABLET | Freq: Two times a day (BID) | ORAL | 0 refills | Status: DC | PRN

## 2020-11-04 MED ORDER — HYDROCODONE-ACETAMINOPHEN 5-325 MG PO TABS: 1.0000 | ORAL_TABLET | Freq: Four times a day (QID) | ORAL | 0 refills | Status: DC | PRN

## 2020-11-04 NOTE — Progress Notes (Signed)
Subjective:    Patient ID: [name], male    DOB: 02-Jul-1976, 45 y.o.   MRN: [id number]  HPI   Pt is a 45 yr old male with COVID ICU myopathy, Long COVID,  s/p surgery on feet- skin grafts and R foot osteomyelitis as well. Here for f/u on ICU myopathy and Long COVID.   Weight up to 306 lbs- been exercising last 2 weeks- but not really before.  BMI up to 46.5 from 45.  Working out 45-60 minutes every other day.  Starts with feet and legs- lifting 5 lbs weights and therabands some. Need a stronger band. Has the red band, which is now pretty easy.  Got pedal bike- waste of money- not using-  Shoes don't fit in the straps.  Feet try to turn out. And strap tries to keep forward.   Motivation not there. Per wife.    Lost father 2/12- pt's wife.  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  Brother is bipolar- not treated.   Wife has "turned up the heat" on him- he didn't call me, when he needed to about mood.   Depression- been "OK"- hasn't gotten worse, but really better. Stable- 5-6/10 with 10/10 being great day.   Brain Fog- still gets times that's foggy- 1x/day-   Sleeping has gotten a little better- going to bed earlier- staying on phone not as late.   Only takes Norco at night before goes to sleep- so only 1x/day- because not on feet much of the time.   Off ABX- finished last week for now.   Goal to get into sneakers/PT in 6-8 weeks or so.  Whatever Dr Dellia Nims says- nothing in stone.      Pain Inventory Average Pain 4 Pain Right Now 3 My pain is constant and sharp  LOCATION OF PAIN  feet  BOWEL Number of stools per week: 7 Oral laxative use No  Type of laxative Enema or suppository use No  History of colostomy No  Incontinent No   BLADDER Normal In and out cath, frequency  Able to self cath na Bladder incontinence No  Frequent urination No  Leakage with coughing No  Difficulty starting stream No   Incomplete bladder emptying No    Mobility walk with assistance use a walker ability to climb steps?  yes do you drive?  yes transfers alone  Function not employed: date last employed . disabled: date disabled applied I need assistance with the following:  bathing, meal prep, household duties and shopping  Neuro/Psych weakness numbness trouble walking spasms depression anxiety  Prior Studies Any changes since last visit?  no  Physicians involved in your care Any changes since last visit?  no   Family History  Problem Relation Age of Onset  . Asthma Brother   . Diabetes Father   . Hypertension Father   . Diabetes Paternal Grandmother   . Hypertension Paternal Grandmother   . Migraines Mother   . GER disease Mother   . Pancreatic cancer Maternal Grandmother   . Heart disease Maternal Grandfather    Social History   Socioeconomic History  . Marital status: Married    Spouse name: Not on file  . Number of children: 1  . Years of education: Not on file  . Highest education level: Not on file  Occupational History  . Occupation: delivery driver  Tobacco Use  . Smoking status: Never Smoker  . Smokeless  tobacco: Never Used  Vaping Use  . Vaping Use: Never used  Substance and Sexual Activity  . Alcohol use: Yes    Comment: rarely 2-3 times a year  . Drug use: No  . Sexual activity: Yes    Partners: Female  Other Topics Concern  . Not on file  Social History Narrative  . Not on file   Social Determinants of Health   Financial Resource Strain: Not on file  Food Insecurity: Not on file  Transportation Needs: Not on file  Physical Activity: Not on file  Stress: Not on file  Social Connections: Not on file   Past Surgical History:  Procedure Laterality Date  . CANNULATION FOR ECMO (EXTRACORPOREAL MEMBRANE OXYGENATION) N/A 08/28/2019   Procedure: CANNULATION FOR VV ECMO (EXTRACORPOREAL MEMBRANE OXYGENATION);  Surgeon: Prescott Gum, Collier Salina, MD;  Location:  [facility];  Service: Open Heart Surgery;  Laterality: N/A;  CRESCENT CANNULA  . CANNULATION FOR ECMO (EXTRACORPOREAL MEMBRANE OXYGENATION) N/A 09/10/2019   Procedure: CANNULATION FOR ECMO (EXTRACORPOREAL MEMBRANE OXYGENATION) PUTTING IN CRESCENT 32FR CANNULA  AND REMOVING GROING CANNULATION;  Surgeon: Wonda Olds, MD;  Location: [place];  Service: Open Heart Surgery;  Laterality: N/A;  PUTTING IN CRESCENT/REMOVING GROIN CANNULATION  . CYSTOSCOPY/URETEROSCOPY/HOLMIUM LASER/STENT PLACEMENT Left 04/18/2019   Procedure: LEFT URETEROSCOPY/HOLMIUM LASER/STENT PLACEMENT;  Surgeon: Ardis Hughs, MD;  Location: [facility];  Service: Urology;  Laterality: Left;  . CYSTOSCOPY/URETEROSCOPY/HOLMIUM LASER/STENT PLACEMENT Left 05/02/2019   Procedure: CYSTOSCOPY/URETEROSCOPY/HOLMIUM LASER/STENT EXCHANGE;  Surgeon: Ardis Hughs, MD;  Location: [facility];  Service: Urology;  Laterality: Left;  . ECMO CANNULATION N/A 08/03/2019   Procedure: ECMO CANNULATION;  Surgeon: Wonda Olds, MD;  Location: [facility];  Service: Cardiovascular;  Laterality: N/A;  . ESOPHAGOGASTRODUODENOSCOPY N/A 09/11/2019   Procedure: ESOPHAGOGASTRODUODENOSCOPY (EGD);  Surgeon: Wonda Olds, MD;  [facility];  Service: Thoracic;  Laterality: N/A;  . GRAFT APPLICATION Bilateral 01/10/5408   Procedure: APPLICATION OF SKIN GRAFT BILATERAL FEET;  Surgeon: Edrick Kins, DPM;  Location: [facility];  Service: Podiatry;  Laterality: Bilateral;  . IR REPLC GASTRO/COLONIC TUBE PERCUT W/FLUORO  10/14/2019  . IRRIGATION AND DEBRIDEMENT SHOULDER Right 09/29/2017   Procedure: IRRIGATION AND DEBRIDEMENT SHOULDER;  Surgeon: Leandrew Koyanagi, MD;  Location: [place];  Service: Orthopedics;  Laterality: Right;  . [place] SURGERY  2002  . NASAL ENDOSCOPY WITH EPISTAXIS CONTROL N/A 08/31/2019   Procedure: NASAL ENDOSCOPY WITH EPISTAXIS CONTROL WITH CAUTERIZATION;  Surgeon: Melida Quitter, MD;  Location: [place];  Service: ENT;   Laterality: N/A;  . PORTACATH PLACEMENT N/A 09/11/2019   Procedure: PEG TUBE INSERTION - BEDSIDE;  Surgeon: Wonda Olds, MD;  Location: [place];  Service: Thoracic;  Laterality: N/A;  . TEE WITHOUT CARDIOVERSION N/A 08/28/2019   Procedure: TRANSESOPHAGEAL ECHOCARDIOGRAM (TEE);  Surgeon: Prescott Gum, Collier Salina, MD;  Location: [facility];  Service: Open Heart Surgery;  Laterality: N/A;  . TEE WITHOUT CARDIOVERSION N/A 09/10/2019   Procedure: TRANSESOPHAGEAL ECHOCARDIOGRAM (TEE);  Surgeon: Wonda Olds, MD;  Location: [facility];  Service: Open Heart Surgery;  Laterality: N/A;  . WOUND DEBRIDEMENT Bilateral 03/27/2020   Procedure: EXCISIONAL DEBRIDEMENT OF ULCERS BILATERAL FEET;  Surgeon: Edrick Kins, DPM;  Location: [facility];  Service: Podiatry;  Laterality: Bilateral;   Past Medical History:  Diagnosis Date  . Anginal pain ([place])    with covid  . Anxiety   . Asthma   . Dyspnea   . GERD (gastroesophageal reflux disease)   .  Headache   . History of kidney stones    LEFT URETERAL STONE  . HTN (hypertension)   . Pancreatitis 2018   GALLBALDDER SLUDGE CAUSED ISSUED RESOLVED  . Pneumonia 07/2019   covid   BP (!) 155/81   Pulse 81   Temp 98.2 F (36.8 C)   SpO2 (!) 87% Comment: wears O2 @[facility]   Opioid Risk Score:   Fall Risk Score:  `1  Depression screen PHQ 2/9  Depression screen [facility] 2/9 11/04/2020 10/15/2020 09/09/2020 07/01/2020 04/15/2020 01/03/2020  Decreased Interest 1 0 1 0 - 0  Down, Depressed, Hopeless 1 1 1  0 - 1  PHQ - 2 Score 2 1 2  0 - 1  Altered sleeping - - - - - 1  Tired, decreased energy - - - - - 2  Change in appetite - - - - 1 0  Feeling bad or failure about yourself  - - - - 1 1  Trouble concentrating - - - - - 0  Moving slowly or fidgety/restless - - - - - 1  Suicidal thoughts - - - - - 0  PHQ-9 Score - - - - - 6  Difficult doing work/chores - - - - - Somewhat difficult  Some recent data might be hidden    Review of Systems  Constitutional: Positive for unexpected weight  change.  HENT: Negative.   Eyes: Negative.   Respiratory:       O2 at night 2 l [place]  Cardiovascular: Negative.   Gastrointestinal: Negative.   Genitourinary: Negative.   Musculoskeletal:       Foot pain  Skin: Negative.   Allergic/Immunologic: Negative.   Neurological: Positive for numbness.  Hematological: Negative.   Psychiatric/Behavioral: Positive for dysphoric mood. The patient is nervous/anxious.   All other systems reviewed and are negative.      Objective:   Physical Exam  Awake, alert, appropriate, in manual w/c, NAD MS:  HF 5-/5, KE, 5-/5, KF 5-/5,  R- DF  4+/5, and PF 4+/5 L- DF 2/5, and PF 4-/5       Assessment & Plan:    Pt is a 45 yr old male with COVID ICU myopathy, Long COVID,  s/p surgery on feet- skin grafts and R foot osteomyelitis as well. Here for f/u on ICU myopathy and Long COVID.  Also has L foot drop.   1. Try to take straps off pedal bike and see if can use.   2. Needs to keep a list of what needs to get done- son also autistic and that complicates things. Suggest dry erase board.   3. Con't Norco- 1 tab BID as needed for chronic pain- #60- no refills.    4. When time for outpt PT- can order it for pt.  In 1-2 months or so.   5. L foot drop- suggest once can wear sneakers, can use L foot up brace- can order once in PT?.   6. Wait on Celexa- adding back  7. F/U in 2 months- double visit- Dr Lyman Speller? If cannot get better. - seeing Velva Harman individually and together for marriage counseling.   I spent a total of 30 minutes on visit- discussing options to focus on brian fog/helping around the house, pedal bike, and depression.

## 2020-11-04 NOTE — Progress Notes (Signed)
[name], [name] ([id number]) Visit Report for 10/30/2020 Arrival Information Details Patient Name: Date of Service: [name], [place]. 10/30/2020 9:45 A M Medical Record Number: [id number] Patient Account Number: [id number] Date of Birth/Sex: Treating RN: 11-13-1975 (45 y.o. [name], [name] Primary Care Shean Gerding: [name] Other Clinician: Referring Braeden Dolinski: Treating Kalandra Masters/Extender: [name] in Treatment: 15 Visit Information History Since Last Visit Added or deleted any medications: No Patient Arrived: Ambulatory Any new allergies or adverse reactions: No Arrival Time: 10:25 Had a fall or experienced change in No Accompanied By: wife activities of daily living that may affect Transfer Assistance: None risk of falls: Patient Identification Verified: Yes Signs or symptoms of abuse/neglect since last visito No Secondary Verification Process Completed: Yes Hospitalized since last visit: No Patient Requires Transmission-Based Precautions: No Implantable device outside of the clinic excluding No Patient Has Alerts: No cellular tissue based products placed in the center since last visit: Has Dressing in Place as Prescribed: Yes Pain Present Now: Yes Electronic Signature(s) Signed: 10/30/2020 2:16:37 PM By: [name] RN Entered By: [name] on 10/30/2020 10:25:55 -------------------------------------------------------------------------------- Encounter Discharge Information Details Patient Name: Date of Service: [name], [place]. 10/30/2020 9:45 A M Medical Record Number: [id number] Patient Account Number: [id number] Date of Birth/Sex: Treating RN: May 27, 1976 (45 y.o. [name], [name] Primary Care Luvern Mischke: [name] Other Clinician: Referring Mikalia Fessel: Treating Aidan Moten/Extender: [name] in Treatment: 15 Encounter Discharge Information Items Post Procedure Vitals Discharge Condition:  Stable Temperature (F): 98.7 Ambulatory Status: Ambulatory Pulse (bpm): 74 Discharge Destination: Home Respiratory Rate (breaths/min): 17 Transportation: Private Auto Blood Pressure (mmHg): 157/94 Accompanied By: self Schedule Follow-up Appointment: Yes Clinical Summary of Care: Patient Declined Electronic Signature(s) Signed: 10/30/2020 2:16:37 PM By: [name] RN Entered By: [name] on 10/30/2020 12:29:48 -------------------------------------------------------------------------------- Lower Extremity Assessment Details Patient Name: Date of Service: [name], [place]. 10/30/2020 9:45 A M Medical Record Number: [id number] Patient Account Number: [id number] Date of Birth/Sex: Treating RN: 05/10/1976 (45 y.o. [name], [name] Primary Care [name]: [name] Other Clinician: Referring [name]: Treating Del Overfelt/Extender: [name] in Treatment: 15 Edema Assessment Assessed: [name]: No] [name]: No] Edema: [Left: Yes] [Right: Yes] Calf Left: Right: Point of Measurement: From Medial Instep 42 cm 43 cm Ankle Left: Right: Point of Measurement: From Medial Instep 25 cm 24 cm Vascular Assessment Pulses: Dorsalis Pedis Palpable: [Left:Yes] [Right:Yes] Posterior Tibial Palpable: [Left:Yes] [Right:Yes] Electronic Signature(s) Signed: 10/30/2020 2:16:37 PM By: [name] RN Entered By: [name] on 10/30/2020 10:29:37 -------------------------------------------------------------------------------- Multi Wound Chart Details Patient Name: Date of Service: [name], [name] 10/30/2020 9:45 A M Medical Record Number: [id number] Patient Account Number: [id number] Date of Birth/Sex: Treating RN: 25-Jul-1976 (45 y.o. [name] Primary Care [name]: [name] Other Clinician: Referring [name]: Treating Zhamir Pirro/Extender: [name] in Treatment: 15 Vital Signs Height(in):  68 Pulse(bpm): 80 Weight(lbs): 280 Blood Pressure(mmHg): 157/93 Body Mass Index(BMI): 41 Temperature(F): 98.7 Respiratory Rate(breaths/min): 17 Photos: [1:No Photos Right Calcaneus] [2:No Photos Left Calcaneus] [N/A:N/A N/A] Wound Location: [1:Pressure Injury] [2:Pressure Injury] [N/A:N/A] Wounding Event: [1:Pressure Ulcer] [2:Pressure Ulcer] [N/A:N/A] Primary Etiology: [1:Asthma, Angina, Hypertension] [2:Asthma, Angina, Hypertension] [N/A:N/A] Comorbid History: [1:10/07/2019] [2:10/07/2019] [N/A:N/A] Date Acquired: [1:15] [2:15] [N/A:N/A] Weeks of Treatment: [1:Open] [2:Open] [N/A:N/A] Wound Status: [1:4x1.5x0.3] [2:4.7x1.7x0.5] [N/A:N/A] Measurements L x W x D (cm) [1:4.712] [2:6.275] [N/A:N/A] A (cm) : rea [1:1.414] [2:3.138] [N/A:N/A] Volume (cm) : [1:85.10%] [2:76.80%] [N/A:N/A] % Reduction in A rea: [1:95.50%] [2:88.40%] [  N/A:N/A] % Reduction in Volume: [1:12] [2:12] Starting Position 1 (o'clock): [1:12] [2:4] Ending Position 1 (o'clock): [1:0.4] [2:1] Maximum Distance 1 (cm): [1:Yes] [2:Yes] [N/A:N/A] Undermining: [1:Category/Stage III] [2:Category/Stage III] [N/A:N/A] Classification: [1:Medium] [2:Medium] [N/A:N/A] Exudate A mount: [1:Serosanguineous] [2:Serosanguineous] [N/A:N/A] Exudate Type: [1:red, brown] [2:red, brown] [N/A:N/A] Exudate Color: [1:Thickened] [2:Thickened] [N/A:N/A] Wound Margin: [1:Large (67-100%)] [2:Medium (34-66%)] [N/A:N/A] Granulation A mount: [1:Pink, Pale] [2:Pink] [N/A:N/A] Granulation Quality: [1:Small (1-33%)] [2:Medium (34-66%)] [N/A:N/A] Necrotic A mount: [1:Fat Layer (Subcutaneous Tissue): Yes Fat Layer (Subcutaneous Tissue): Yes N/A] Exposed Structures: [1:Fascia: No Tendon: No Muscle: No Joint: No Bone: No Medium (34-66%)] [2:Fascia: No Tendon: No Muscle: No Joint: No Bone: No Small (1-33%)] [N/A:N/A] Epithelialization: [1:Cellular or Tissue Based Product] [2:Cellular or Tissue Based Product] [N/A:N/A] Treatment Notes Electronic  Signature(s) Signed: 10/30/2020 6:16:52 PM By: Lorrin Jackson Signed: 11/02/2020 9:50:14 AM By: Linton Ham MD Entered By: Linton Ham on 10/30/2020 11:49:21 -------------------------------------------------------------------------------- Multi-Disciplinary Care Plan Details Patient Name: Date of Service: [name], [name] 10/30/2020 9:45 A M Medical Record Number: [id number] Patient Account Number: [id number] Date of Birth/Sex: Treating RN: [birth year] (45 y.o. [name] Primary Care Clorissa Gruenberg: [name] Other Clinician: Referring Lakeria Starkman: Treating Rufino Staup/Extender: [name] in Treatment: 15 Multidisciplinary Care Plan reviewed with physician Active Inactive Abuse / Safety / Falls / Self Care Management Nursing Diagnoses: Potential for falls Goals: Patient/caregiver will verbalize/demonstrate measures taken to prevent injury and/or falls Date Initiated: 07/15/2020 Target Resolution Date: 11/06/2020 Goal Status: Active Interventions: Assess fall risk on admission and as needed Assess impairment of mobility on admission and as needed per policy Notes: Wound/Skin Impairment Nursing Diagnoses: Impaired tissue integrity Knowledge deficit related to ulceration/compromised skin integrity Goals: Patient/caregiver will verbalize understanding of skin care regimen Date Initiated: 07/15/2020 Target Resolution Date: 12/04/2020 Goal Status: Active Ulcer/skin breakdown will have a volume reduction of 30% by week 4 Date Initiated: 07/15/2020 Date Inactivated: 08/20/2020 Target Resolution Date: 09/03/2020 Goal Status: Unmet Unmet Reason: no major changes. Interventions: Assess patient/caregiver ability to obtain necessary supplies Assess patient/caregiver ability to perform ulcer/skin care regimen upon admission and as needed Assess ulceration(s) every visit Provide education on ulcer and skin care Treatment Activities: Skin care regimen initiated  : 07/15/2020 Topical wound management initiated : 07/15/2020 Notes: Electronic Signature(s) Signed: 10/30/2020 10:42:44 AM By: Lorrin Jackson Entered By: Lorrin Jackson on 10/30/2020 10:42:43 -------------------------------------------------------------------------------- Pain Assessment Details Patient Name: Date of Service: [name], [place]. 10/30/2020 9:45 A M Medical Record Number: [id number] Patient Account Number: [id number] Date of Birth/Sex: Treating RN: 06-03-1976 (45 y.o. [name] Primary Care Nevin Kozuch: [name] Other Clinician: Referring Marcia Hartwell: Treating Tashana Haberl/Extender: [name] in Treatment: 15 Active Problems Location of Pain Severity and Description of Pain Patient Has Paino Yes Site Locations Pain Location: Pain in Ulcers With Dressing Change: Yes Duration of the Pain. Constant / Intermittento Intermittent Rate the pain. Current Pain Level: 3 Worst Pain Level: 10 Least Pain Level: 0 Tolerable Pain Level: 3 Character of Pain Describe the Pain: Aching Pain Management and Medication Current Pain Management: Medication: No Cold Application: No Rest: No Massage: No Activity: No T.E.N.S.: No Heat Application: No Leg drop or elevation: No Is the Current Pain Management Adequate: Adequate How does your wound impact your activities of daily livingo Sleep: No Bathing: No Appetite: No Relationship With Others: No Bladder Continence: No Emotions: No Bowel Continence: No Work: No Toileting: No Drive: No Dressing: No Hobbies: No Electronic Signature(s) Signed: 10/30/2020 2:16:37 PM By: [name]  RN Entered By: [name] on 10/30/2020 10:27:05 -------------------------------------------------------------------------------- Patient/Caregiver Education Details Patient Name: Date of Service: [name], [place]. 3/25/2022andnbsp9:45 [place] Record Number: [id number] Patient Account Number:  [id number] Date of Birth/Gender: Treating RN: 10-28-1975 (45 y.o. [name] Primary Care Physician: [name] Other Clinician: Referring Physician: Treating Physician/Extender: [name] in Treatment: 15 Education Assessment Education Provided To: Patient Education Topics Provided Wound/Skin Impairment: Methods: Demonstration, Explain/Verbal, Printed Responses: State content correctly Electronic Signature(s) Signed: 10/30/2020 6:16:52 PM By: Lorrin Jackson Entered By: Lorrin Jackson on 10/30/2020 10:51:54 -------------------------------------------------------------------------------- Wound Assessment Details Patient Name: Date of Service: [name], [name] 10/30/2020 9:45 A M Medical Record Number: [id number] Patient Account Number: [id number] Date of Birth/Sex: Treating RN: 1976/06/30 (45 y.o. [name], [name] Primary Care Christan Defranco: [name] Other Clinician: Referring Cella Cappello: Treating Tanisha Lutes/Extender: [name] in Treatment: 15 Wound Status Wound Number: 1 Primary Etiology: Pressure Ulcer Wound Location: Right Calcaneus Wound Status: Open Wounding Event: Pressure Injury Comorbid History: Asthma, Angina, Hypertension Date Acquired: 10/07/2019 Weeks Of Treatment: 15 Clustered Wound: No Photos Wound Measurements Length: (cm) 4 Width: (cm) 1.5 Depth: (cm) 0.3 Area: (cm) 4.712 Volume: (cm) 1.414 % Reduction in Area: 85.1% % Reduction in Volume: 95.5% Epithelialization: Medium (34-66%) Tunneling: No Undermining: Yes Starting Position (o'clock): 12 Ending Position (o'clock): 12 Maximum Distance: (cm) 0.4 Wound Description Classification: Category/Stage III Wound Margin: Thickened Exudate Amount: Medium Exudate Type: Serosanguineous Exudate Color: red, brown Foul Odor After Cleansing: No Slough/Fibrino Yes Wound Bed Granulation Amount: Large (67-100%) Exposed Structure Granulation Quality:  Pink, Pale Fascia Exposed: No Necrotic Amount: Small (1-33%) Fat Layer (Subcutaneous Tissue) Exposed: Yes Necrotic Quality: Adherent Slough Tendon Exposed: No Muscle Exposed: No Joint Exposed: No Bone Exposed: No Treatment Notes Wound #1 (Calcaneus) Wound Laterality: Right Cleanser Peri-Wound Care Topical Primary Dressing Secondary Dressing Woven Gauze Sponge, Non-Sterile 4x4 in Discharge Instruction: ensure to fold and apply up to the Collagen to ensure it keeps the Collagen in place next to wound bed. Apply over primary dressing as directed. ABD Pad, 5x9 Discharge Instruction: Apply over primary dressing as directed. Secured With Elastic Bandage 4 inch (ACE bandage) Discharge Instruction: Secure with ACE bandage as directed. Kerlix Roll Sterile, 4.5x3.1 (in/yd) Discharge Instruction: Secure with Kerlix as directed. Paper Tape, 2x10 (in/yd) Discharge Instruction: Secure dressing with tape as directed. Compression Wrap Compression Stockings Add-Ons Electronic Signature(s) Signed: 11/02/2020 5:14:32 PM By: [name] RN Signed: 11/04/2020 9:05:34 AM By: Sandre Kitty Previous Signature: 10/30/2020 2:16:37 PM Version By: [name] RN Entered By: Sandre Kitty on 10/30/2020 17:02:33 -------------------------------------------------------------------------------- Wound Assessment Details Patient Name: Date of Service: [name], [name] 10/30/2020 9:45 A M Medical Record Number: [id number] Patient Account Number: [id number] Date of Birth/Sex: Treating RN: 1976-05-10 (45 y.o. [name], [name] Primary Care Arlo Buffone: [name] Other Clinician: Referring Danica Camarena: Treating Filipe Greathouse/Extender: [name] in Treatment: 15 Wound Status Wound Number: 2 Primary Etiology: Pressure Ulcer Wound Location: Left Calcaneus Wound Status: Open Wounding Event: Pressure Injury Comorbid History: Asthma, Angina, Hypertension Date Acquired:  10/07/2019 Weeks Of Treatment: 15 Clustered Wound: No Photos Wound Measurements Length: (cm) 4.7 Width: (cm) 1.7 Depth: (cm) 0.5 Area: (cm) 6.275 Volume: (cm) 3.138 % Reduction in Area: 76.8% % Reduction in Volume: 88.4% Epithelialization: Small (1-33%) Undermining: Yes Starting Position (o'clock): 12 Ending Position (o'clock): 4 Maximum Distance: (cm) 1 Wound Description Classification: Category/Stage III Wound Margin: Thickened Exudate Amount: Medium Exudate Type: Serosanguineous Exudate Color: red, brown  Foul Odor After Cleansing: No Slough/Fibrino Yes Wound Bed Granulation Amount: Medium (34-66%) Exposed Structure Granulation Quality: Pink Fascia Exposed: No Necrotic Amount: Medium (34-66%) Fat Layer (Subcutaneous Tissue) Exposed: Yes Necrotic Quality: Adherent Slough Tendon Exposed: No Muscle Exposed: No Joint Exposed: No Bone Exposed: No Treatment Notes Wound #2 (Calcaneus) Wound Laterality: Left Cleanser Peri-Wound Care Topical Primary Dressing Secondary Dressing Woven Gauze Sponge, Non-Sterile 4x4 in Discharge Instruction: ensure to fold and apply up to the Collagen to ensure it keeps the Collagen in place next to wound bed. Apply over primary dressing as directed. ABD Pad, 5x9 Discharge Instruction: Apply over primary dressing as directed. Secured With Elastic Bandage 4 inch (ACE bandage) Discharge Instruction: Secure with ACE bandage as directed. Kerlix Roll Sterile, 4.5x3.1 (in/yd) Discharge Instruction: Secure with Kerlix as directed. Paper Tape, 2x10 (in/yd) Discharge Instruction: Secure dressing with tape as directed. Compression Wrap Compression Stockings Add-Ons Electronic Signature(s) Signed: 11/02/2020 5:14:32 PM By: [name] RN Signed: 11/04/2020 9:05:34 AM By: Sandre Kitty Previous Signature: 10/30/2020 2:16:37 PM Version By: [name] RN Entered By: Sandre Kitty on 10/30/2020  17:03:00 -------------------------------------------------------------------------------- Vitals Details Patient Name: Date of Service: [name], [place]. 10/30/2020 9:45 A M Medical Record Number: [id number] Patient Account Number: [id number] Date of Birth/Sex: Treating RN: [birth year] (45 y.o. [name], [name] Primary Care Mella Inclan: [name] Other Clinician: Referring Sricharan Lacomb: Treating Lavaeh Bau/Extender: [name] in Treatment: 15 Vital Signs Time Taken: 10:26 Temperature (F): 98.7 Height (in): 69 Pulse (bpm): 80 Weight (lbs): 280 Respiratory Rate (breaths/min): 17 Body Mass Index (BMI): 41.3 Blood Pressure (mmHg): 157/93 Reference Range: 80 - 120 mg / dl Electronic Signature(s) Signed: 10/30/2020 2:16:37 PM By: [name] RN Entered By: [name] on 10/30/2020 10:26:20

## 2020-11-04 NOTE — Patient Instructions (Addendum)
  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 Here for f/u on ICU myopathy and Long COVID.  Also has L foot drop.   1. Try to take straps off pedal bike and see if can use.   2. Needs to keep a list of what needs to get done- son also autistic and that complicates things. Suggest dry erase board.   3. Con't Norco- 1 tab BID as needed for chronic pain- #60- no refills.  Will get opiate contract.   4. When time for outpt PT- can order it for pt.  In 1-2 months or so.   5. L foot drop- suggest once can wear sneakers, can use L foot up brace- can order once in PT?.   6. Wait on Celexa- adding back for now- but might need to add back in future  7. F/U in 2 months- double visit-  8. Brain fog occurs when when tired. Try to write things down for him all the time.  State of the [employer]- daily- no more than 10 minutes- write down goals based on it.

## 2020-11-05 ENCOUNTER — Telehealth: Payer: Self-pay | Admitting: Podiatry

## 2020-11-05 NOTE — Telephone Encounter (Signed)
Good Morning I am still holding for 3/14 notes for Disability. Thank you

## 2020-11-05 NOTE — Progress Notes (Signed)
   HPI: 45 y.o. male presenting today for follow-up evaluation of bilateral wounds to the weightbearing surfaces of the bilateral feet.  Patient continues to be managed at the [facility] for the wounds.  Currently there is a graft in place in the wound bed.  Today the patient complains of elongated thickened sensitive nails 1-5 bilateral.  He is unable to trim his own toenails.  Past Medical History:  Diagnosis Date  . Anginal pain ([place])    with covid  . Anxiety   . Asthma   . Dyspnea   . GERD (gastroesophageal reflux disease)   . Headache   . History of kidney stones    LEFT URETERAL STONE  . HTN (hypertension)   . Pancreatitis 2018   GALLBALDDER SLUDGE CAUSED ISSUED RESOLVED  . Pneumonia 07/2019   covid    Physical Exam: General: The patient is alert and oriented x3 in no acute distress.  Dermatology: Full-thickness pressure ulcers noted to the bilateral weightbearing surfaces of the feet across the entire sole of the foot.  Wound grafts are intact to the bilateral wounds.  Elongated, thickened, discolored nails noted 1-5 bilateral with associated tenderness to palpation  Vascular: Palpable pedal pulses bilaterally.  There is some minimal edema edema noted. Capillary refill within normal limits.  Neurological: Epicritic and protective threshold grossly intact bilaterally.   Musculoskeletal Exam: Range of motion within normal limits to all pedal and ankle joints bilateral. Muscle strength 5/5 in all groups bilateral.   MRI impression 08/27/2020: 1. Large plantar soft tissue ulceration underlying the posterior calcaneus with surrounding cellulitis. No organized or rim enhancing fluid collection. 2. Prominent bone marrow edema and enhancement within the plantar and lateral aspects of the calcaneal tuberosity favored to represent early acute osteomyelitis. 3. Prominent intramuscular edema within the plantar foot musculature suggesting a nonspecific myositis. 4. Edema and  enhancement in the region of the distal posterior tibial vein at the level of the medial malleolus and plantar hindfoot, which may reflect a superficial thrombophlebitis. 5. Mild distal Achilles tendinosis.  Assessment: 1.  Full-thickness pressure ulcers bilateral feet 2.  Pain due to onychomycosis of toenails bilateral 3.  History of COVID-19 hospitalization.  Discharge 12/04/2019 4.  Status post debridement with application of skin substitute bilateral feet.  DOS: 03/27/2020 5.  Acute early stages of osteomyelitis right calcaneus  Plan of Care:  1. Patient evaluated.   2.  Mechanical debridement of nails 1-5 bilateral was performed using a nail nipper without incident or bleeding 3.  In regards to the ulcers to the bilateral feet, the graft was left intact today.  Patient states that he gets the PICC line out today 4.  Continue management at the [facility] wound care center  5.  Return to clinic in 3 months for routine foot care and wound follow-up  Edrick Kins, DPM [facility]  Dr. Edrick Kins, DPM    [address], [place] [address]                Office [phone]  Fax [phone]

## 2020-11-06 ENCOUNTER — Other Ambulatory Visit: Payer: Self-pay

## 2020-11-06 ENCOUNTER — Encounter (HOSPITAL_BASED_OUTPATIENT_CLINIC_OR_DEPARTMENT_OTHER): Payer: BC Managed Care – PPO | Attending: Internal Medicine | Admitting: Internal Medicine

## 2020-11-06 DIAGNOSIS — I10 Essential (primary) hypertension: Secondary | ICD-10-CM | POA: Diagnosis not present

## 2020-11-06 DIAGNOSIS — Z8249 Family history of ischemic heart disease and other diseases of the circulatory system: Secondary | ICD-10-CM | POA: Insufficient documentation

## 2020-11-06 DIAGNOSIS — L97522 Non-pressure chronic ulcer of other 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: Secondary | ICD-10-CM | POA: Diagnosis not present

## 2020-11-06 DIAGNOSIS — Z833 Family history of diabetes mellitus: Secondary | ICD-10-CM | POA: Diagnosis not present

## 2020-11-06 DIAGNOSIS — L89619 Pressure ulcer of right heel, unspecified stage: Secondary | ICD-10-CM | POA: Diagnosis not present

## 2020-11-06 DIAGNOSIS — L97512 Non-pressure chronic ulcer of other part of right foot with fat layer exposed: Secondary | ICD-10-CM | POA: Diagnosis not present

## 2020-11-06 DIAGNOSIS — L89893 Pressure ulcer of other site, stage 3: Secondary | ICD-10-CM | POA: Insufficient documentation

## 2020-11-06 DIAGNOSIS — Z8616 Personal history of COVID-19: Secondary | ICD-10-CM | POA: Insufficient documentation

## 2020-11-06 DIAGNOSIS — L89629 Pressure ulcer of left heel, unspecified stage: Secondary | ICD-10-CM | POA: Diagnosis not present

## 2020-11-06 NOTE — Progress Notes (Signed)
SHANTANU, STRAUCH ([id number]) Visit Report for 11/06/2020 Cellular or Tissue Based Product Details Patient Name: Date of Service: [name] [place] 11/06/2020 9:30 A M Medical Record Number: [id number] Patient Account Number: [id number] Date of Birth/Sex: Treating RN: 1975-08-23 (45 y.o. [name] Primary Care Provider: [name] Other Clinician: Referring Provider: Treating Provider/Extender: [name] in Treatment: 16 Cellular or Tissue Based Product Type Wound #1 Right Calcaneus Applied to: Performed By: Physician Ricard Dillon., MD Cellular or Tissue Based Product Type: Oasis tri-layer Level of Consciousness (Pre-procedure): Awake and Alert Pre-procedure Verification/Time Out Yes - 09:48 Taken: Location: genitalia / hands / feet / multiple digits Wound Size (sq cm): 10.56 Product Size (sq cm): 17 Waste Size (sq cm): 0 Amount of Product Applied (sq cm): 17 Instrument Used: Forceps Lot #: [id number] Expiration Date: 12/08/2021 Reconstituted: Yes Solution Type: Saline Solution Amount: 5cc Lot #: [id number] Solution Expiration Date: 02/05/2022 Secured: Yes Secured With: Steri-Strips Dressing Applied: Yes Primary Dressing: Adaptic, Drawtex Level of Consciousness (Post- Awake and Alert procedure): Post Procedure Diagnosis Same as Pre-procedure Electronic Signature(s) Signed: 11/06/2020 5:01:47 PM By: Linton Ham MD Signed: 11/06/2020 5:18:29 PM By: Lorrin Jackson Entered By: Lorrin Jackson on 11/06/2020 09:54:24 -------------------------------------------------------------------------------- Cellular or Tissue Based Product Details Patient Name: Date of Service: [name], [place] 11/06/2020 9:30 A M Medical Record Number: [id number] Patient Account Number: [id number] Date of Birth/Sex: Treating RN: 1976-01-07 (45 y.o. [name] Primary Care Provider: [name] Other Clinician: Referring Provider: Treating Provider/Extender: [name] in Treatment: 16 Cellular or Tissue Based Product Type Wound #2 Left Calcaneus Applied to: Performed By: Physician Ricard Dillon., MD Cellular or Tissue Based Product Type: Oasis tri-layer Level of Consciousness (Pre-procedure): Awake and Alert Pre-procedure Verification/Time Out Yes - 09:48 Taken: Location: genitalia / hands / feet / multiple digits Wound Size (sq cm): 12.6 Product Size (sq cm): 18 Waste Size (sq cm): 0 Amount of Product Applied (sq cm): 18 Instrument Used: Forceps Lot #: [id number] Expiration Date: 12/08/2021 Reconstituted: Yes Solution Type: Saline Solution Amount: 5cc Lot #: [id number] Solution Expiration Date: 02/05/2022 Secured: Yes Secured With: Steri-Strips Dressing Applied: Yes Primary Dressing: Adaptic, Drawtex Level of Consciousness (Post- Awake and Alert procedure): Post Procedure Diagnosis Same as Pre-procedure Electronic Signature(s) Signed: 11/06/2020 5:01:47 PM By: Linton Ham MD Signed: 11/06/2020 5:18:29 PM By: Lorrin Jackson Entered By: Lorrin Jackson on 11/06/2020 09:54:54 -------------------------------------------------------------------------------- HPI Details Patient Name: Date of Service: [name], [place] 11/06/2020 9:30 A M Medical Record Number: [id number] Patient Account Number: [id number] Date of Birth/Sex: Treating RN: 21-May-1976 (45 y.o. [name] Primary Care Provider: [name] Other Clinician: Referring Provider: Treating Provider/Extender: [name] in Treatment: 16 History of Present Illness HPI Description: 07/15/2020 upon evaluation today patient presents for initial inspection here in our clinic concerning issues he has been having with the bottoms of his feet bilaterally. He states these actually occurred as wounds when he was hospitalized for 5 months secondary to Covid. He was apparently with tilting bed where he was in an upright position quite  frequently and apparently this occurred in some way shape or form during that time. Fortunately there is no sign of active infection at this time. No fevers, chills, nausea, vomiting, or diarrhea. With that being said he still has substantial wounds on the plantar aspects of his feet Theragen require quite a bit of work to get these to heal. He has been  using Santyl currently though that is been problematic both in receiving the medication as well as actually paid for it as it is become quite expensive. Prior to the experience with Covid the patient really did not have any major medical problems other than hypertension he does have some mild generalized weakness following the Covid experience. 07/22/2020 on evaluation today patient appears to be doing okay in regard to his foot ulcers I feel like the wound beds are showing signs of better improvement that I do believe the Iodoflex is helping in this regard. With that being said he does have a lot of drainage currently and this is somewhat blue/green in nature which is consistent with Pseudomonas. I do think a culture today would be appropriate for [place] to evaluate and see if that is indeed the case I would likely start him on antibiotic orally as well he is not allergic to Cipro knows of no issues he has had in the past 12/21; patient was admitted to the clinic earlier this month with bilateral presumed pressure ulcers on the bottom of his feet apparently related to excessive pressure from a tilt table arrangement in the intensive care unit. Patient relates this to being on ECMO but I am not really sure that is exactly related to that. I must say I have never seen anything like this. He has fairly extensive full-thickness wounds extending from his heel towards his midfoot mostly centered laterally. There is already been some healing distally. He does not appear to have an arterial issue. He has been using gentamicin to the wound surfaces with Iodoflex to  help with ongoing debridement 1/6; this is a patient with pressure ulcers on the bottom of his feet related to excessive pressure from a standing position in the intensive care unit. He is complaining of a lot of pain in the right heel. He is not a diabetic. 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 We have been using Iodoflex to help prepare the surfaces of both wounds for an advanced treatment product. He is nonambulatory spending most of his time in a wheelchair I have asked him not to propel the wheelchair with his heels 1/13; in general his wounds look better not much surface area change we have been using Iodoflex as of last week. I did an x-ray of the right heel as the patient was complaining of pain. I had some thoughts about a stress fracture perhaps Achilles tendon problems however what it showed was erosive changes along the inferior aspect of the calcaneus he now has a MRI booked for 1/20. 1/20; in general his wounds continue to be better. Some improvement in the large narrow areas proximally in his foot. He is still complaining of pain in the right heel and tenderness in certain areas of this wound. His MRI is tonight. I am not just looking for osteomyelitis that was brought up on the x-ray I am wondering about stress fractures, tendon ruptures etc. He has no such findings on the left. Also noteworthy is that the patient had critical illness neuropathy and some of the discomfort may be actual improvement in nerve function I am just not sure. These wounds were initially in the setting of severe critical illness related to COVID-19. He was put in a standing position. He may have also been on pressors at the point contributing to tissue ischemia. By his description at some point these wounds were grossly necrotic extending proximally up into the Achilles part of his  heel. I do not know that I have ever really seen pictures of them like this although they may exist in  epic We have ordered Tri layer Oasis. I am trying to stimulate some granulation in these areas. This is of course assuming the MRI is negative for infection 1/27; since the patient was last here he saw Dr. Juleen China of infectious disease. He is planned for vancomycin and ceftriaxone. Prior operative culture grew MSSA. Also ordered baseline lab work. He also ordered arterial studies although the ABIs in our clinic were normal as well as his clinical exam these were normal I do not think he needs to see vascular surgery. His ABIs at the PTA were 1.22 in the right triphasic waveforms with a normal TBI of 1.15 on the left ABI of 1.22 with triphasic waveforms and a normal TBI of 1.08. Finally he saw Dr. Amalia Hailey who will follow him in for 2 months. At this point I do not think he felt that he needed a procedure on the right calcaneal bone. Dr. Juleen China is elected for broad-spectrum antibiotic The patient is still having pain in the right heel. He walks with a walker 2/3; wounds are generally smaller. He is tolerating his IV antibiotics. I believe this is vancomycin and ceftriaxone. We are still waiting for Oasis burn in terms of his out-of-pocket max which he should be meeting soon given the IV antibiotics, MRIs etc. I have asked him to check in on this. We are using silver collagen in the meantime the wounds look better 2/10; tolerating IV vancomycin and Rocephin. We are waiting to apply for Oasis. Although I am not really sure where he is in his out-of-pocket max. 2/17 started the first application of Oasis trilayer. Still on antibiotics. The wounds have generally look better. The area on the left has a little more surface slough requiring debridement 6/71; second application of Oasis trilayer. The wound surface granulation is generally look better. The area on the left with undermining laterally I think is come in a bit. 10/08/2020 upon evaluation today patient is here today for [employer] application  #3. Fortunately he seems to be doing extremely well with regard to this and we are seeing a lot of new epithelial growth which is great news. Fortunately there is no signs of active infection at this time. 10/16/2020 upon evaluation today patient appears to be doing well with regard to his foot ulcers. Do believe the Oasis has been of benefit for him. I do not see any signs of infection right now which is great news and I think that he has a lot of new epithelial growth which is great to see as well. The patient is very pleased to hear all of this. I do think we can proceed with the Oasis trilayer #4 today. 3/18; not as much improvement in these areas on his heels that I was hoping. I did reapply trilateral Oasis today the tissue looks healthier but not as much fill in as I was hoping. 3/25; better looking today I think this is come in a bit the tissue looks healthier. Triple layer Oasis reapplied #6 4/1; somewhat better looking definitely better looking surface not as much change in surface area as I was hoping. He may be spending more time [place] on days then he needs to although he does have heel offloading boots. Triple layer Oasis reapplied #7 Electronic Signature(s) Signed: 11/06/2020 5:01:47 PM By: Linton Ham MD Entered By: Linton Ham on 11/06/2020 10:02:05 -------------------------------------------------------------------------------- Physical  Exam Details Patient Name: Date of Service: [name], [place] 11/06/2020 9:30 A M Medical Record Number: [id number] Patient Account Number: [id number] Date of Birth/Sex: Treating RN: 1976-02-14 (45 y.o. [name] Primary Care Provider: [name] Other Clinician: Referring Provider: Treating Provider/Extender: [name] in Treatment: 16 Notes Wound exam; areas are similar I do not see much change in surface area but a better looking surface. He does have rims of epithelialization in some areas which is the  reason I am guardedly optimistic. Triple layer Oasis reapplied in the standard fashion still backing with drawtex Electronic Signature(s) Signed: 11/06/2020 5:01:47 PM By: Linton Ham MD Entered By: Linton Ham on 11/06/2020 10:02:43 -------------------------------------------------------------------------------- Physician Orders Details Patient Name: Date of Service: [name], [place] 11/06/2020 9:30 A M Medical Record Number: [id number] Patient Account Number: [id number] Date of Birth/Sex: Treating RN: 03-01-1976 (45 y.o. [name] Primary Care Provider: [name] Other Clinician: Referring Provider: Treating Provider/Extender: [name] in Treatment: 16 Verbal / Phone Orders: No Diagnosis Coding ICD-10 Coding Code Description [phone] Pressure ulcer of other site, stage 3 M62.81 Muscle weakness (generalized) I10 Essential (primary) hypertension L97.512 Non-pressure chronic ulcer of other part of right foot with fat layer exposed L97.522 Non-pressure chronic ulcer of other 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 Follow-up Appointments Return Appointment in 1 week. Cellular or Tissue Based Products Wound #1 Right Calcaneus Cellular or Tissue Based Product Type: - Oasis Trilayer to both wounds #5 10/23/20, #6 10/30/20, #7 11/06/20 daptic or Mepitel. (DO NOT REMOVE). - Cellular or Tissue Based Product applied to wound bed, secured with steri-strips, cover with A ***Apply drawtex under adaptic, then 4x4 gauze, ABD pad secured with roll gauze and ACE wrap.** Wound #2 Left Calcaneus Cellular or Tissue Based Product Type: - Oasis Trilayer to both wounds #6 daptic or Mepitel. (DO NOT REMOVE). - Cellular or Tissue Based Product applied to wound bed, secured with steri-strips, cover with A ***Apply drawtex under adaptic, then 4x4 gauze, ABD pad secured with roll gauze and ACE wrap.** Bathing/ Shower/ Hygiene May shower with protection but do not get  wound dressing(s) wet. Edema Control - Lymphedema / SCD / Other Bilateral Lower Extremities Elevate legs to the level of the heart or above for 30 minutes daily and/or when sitting, a frequency of: Avoid standing for long periods of time. Off-Loading Wedge shoe to: - Gloped to bilateral feet Other: - keep pressure off of the bottom of your feet Wound Treatment Electronic Signature(s) Signed: 11/06/2020 5:01:47 PM By: Linton Ham MD Signed: 11/06/2020 5:18:29 PM By: Lorrin Jackson Previous Signature: 11/06/2020 9:26:39 AM Version By: Lorrin Jackson Entered By: Lorrin Jackson on 11/06/2020 09:58:03 -------------------------------------------------------------------------------- Problem List Details Patient Name: Date of Service: [name], [place] 11/06/2020 9:30 A M Medical Record Number: [id number] Patient Account Number: [id number] Date of Birth/Sex: Treating RN: 25-Apr-1976 (45 y.o. [name] Primary Care Provider: [name] Other Clinician: Referring Provider: Treating Provider/Extender: [name] in Treatment: 16 Active Problems ICD-10 Encounter Code Description Active Date MDM Diagnosis [phone] Pressure ulcer of other site, stage 3 07/15/2020 No Yes M62.81 Muscle weakness (generalized) 07/15/2020 No Yes I10 Essential (primary) hypertension 07/15/2020 No Yes L97.512 Non-pressure chronic ulcer of other part of right foot with fat layer exposed 09/03/2020 No Yes L97.522 Non-pressure chronic ulcer of other 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 09/03/2020 No Yes Inactive Problems ICD-10 Code Description Active Date Inactive  Date M86.171 Other acute osteomyelitis, right ankle and foot 09/03/2020 09/03/2020 Resolved Problems Electronic Signature(s) Signed: 11/06/2020 5:01:47 PM By: Linton Ham MD Previous Signature: 11/06/2020 9:26:13 AM Version By: Lorrin Jackson Entered By: Linton Ham on 11/06/2020  10:00:15 -------------------------------------------------------------------------------- Progress Note Details Patient Name: Date of Service: [name], [place] 11/06/2020 9:30 A M Medical Record Number: [id number] Patient Account Number: [id number] Date of Birth/Sex: Treating RN: [birth year] (45 y.o. [name] Primary Care Provider: [name] Other Clinician: Referring Provider: Treating Provider/Extender: [name] in Treatment: 16 Subjective History of Present Illness (HPI) 07/15/2020 upon evaluation today patient presents for initial inspection here in our clinic concerning issues he has been having with the bottoms of his feet bilaterally. He states these actually occurred as wounds when he was hospitalized for 5 months secondary to Covid. He was apparently with tilting bed where he was in an upright position quite frequently and apparently this occurred in some way shape or form during that time. Fortunately there is no sign of active infection at this time. No fevers, chills, nausea, vomiting, or diarrhea. With that being said he still has substantial wounds on the plantar aspects of his feet Theragen require quite a bit of work to get these to heal. He has been using Santyl currently though that is been problematic both in receiving the medication as well as actually paid for it as it is become quite expensive. Prior to the experience with Covid the patient really did not have any major medical problems other than hypertension he does have some mild generalized weakness following the Covid experience. 07/22/2020 on evaluation today patient appears to be doing okay in regard to his foot ulcers I feel like the wound beds are showing signs of better improvement that I do believe the Iodoflex is helping in this regard. With that being said he does have a lot of drainage currently and this is somewhat blue/green in nature which is consistent with Pseudomonas. I  do think a culture today would be appropriate for [place] to evaluate and see if that is indeed the case I would likely start him on antibiotic orally as well he is not allergic to Cipro knows of no issues he has had in the past 12/21; patient was admitted to the clinic earlier this month with bilateral presumed pressure ulcers on the bottom of his feet apparently related to excessive pressure from a tilt table arrangement in the intensive care unit. Patient relates this to being on ECMO but I am not really sure that is exactly related to that. I must say I have never seen anything like this. He has fairly extensive full-thickness wounds extending from his heel towards his midfoot mostly centered laterally. There is already been some healing distally. He does not appear to have an arterial issue. He has been using gentamicin to the wound surfaces with Iodoflex to help with ongoing debridement 1/6; this is a patient with pressure ulcers on the bottom of his feet related to excessive pressure from a standing position in the intensive care unit. He is complaining of a lot of pain in the right heel. He is not a diabetic. 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 We have been using Iodoflex to help prepare the surfaces of both wounds for an advanced treatment product. He is nonambulatory spending most of his time in a wheelchair I have asked him not to propel the wheelchair with his heels 1/13; in general  his wounds look better not much surface area change we have been using Iodoflex as of last week. I did an x-ray of the right heel as the patient was complaining of pain. I had some thoughts about a stress fracture perhaps Achilles tendon problems however what it showed was erosive changes along the inferior aspect of the calcaneus he now has a MRI booked for 1/20. 1/20; in general his wounds continue to be better. Some improvement in the large narrow areas proximally in his foot. He is  still complaining of pain in the right heel and tenderness in certain areas of this wound. His MRI is tonight. I am not just looking for osteomyelitis that was brought up on the x-ray I am wondering about stress fractures, tendon ruptures etc. He has no such findings on the left. Also noteworthy is that the patient had critical illness neuropathy and some of the discomfort may be actual improvement in nerve function I am just not sure. These wounds were initially in the setting of severe critical illness related to COVID-19. He was put in a standing position. He may have also been on pressors at the point contributing to tissue ischemia. By his description at some point these wounds were grossly necrotic extending proximally up into the Achilles part of his heel. I do not know that I have ever really seen pictures of them like this although they may exist in epic We have ordered Tri layer Oasis. I am trying to stimulate some granulation in these areas. This is of course assuming the MRI is negative for infection 1/27; since the patient was last here he saw Dr. Juleen China of infectious disease. He is planned for vancomycin and ceftriaxone. Prior operative culture grew MSSA. Also ordered baseline lab work. He also ordered arterial studies although the ABIs in our clinic were normal as well as his clinical exam these were normal I do not think he needs to see vascular surgery. His ABIs at the PTA were 1.22 in the right triphasic waveforms with a normal TBI of 1.15 on the left ABI of 1.22 with triphasic waveforms and a normal TBI of 1.08. Finally he saw Dr. Amalia Hailey who will follow him in for 2 months. At this point I do not think he felt that he needed a procedure on the right calcaneal bone. Dr. Juleen China is elected for broad-spectrum antibiotic The patient is still having pain in the right heel. He walks with a walker 2/3; wounds are generally smaller. He is tolerating his IV antibiotics. I believe this is  vancomycin and ceftriaxone. We are still waiting for Oasis burn in terms of his out-of-pocket max which he should be meeting soon given the IV antibiotics, MRIs etc. I have asked him to check in on this. We are using silver collagen in the meantime the wounds look better 2/10; tolerating IV vancomycin and Rocephin. We are waiting to apply for Oasis. Although I am not really sure where he is in his out-of-pocket max. 2/17 started the first application of Oasis trilayer. Still on antibiotics. The wounds have generally look better. The area on the left has a little more surface slough requiring debridement 0/92; second application of Oasis trilayer. The wound surface granulation is generally look better. The area on the left with undermining laterally I think is come in a bit. 10/08/2020 upon evaluation today patient is here today for [employer] application #3. Fortunately he seems to be doing extremely well with regard to this and we are  seeing a lot of new epithelial growth which is great news. Fortunately there is no signs of active infection at this time. 10/16/2020 upon evaluation today patient appears to be doing well with regard to his foot ulcers. Do believe the Oasis has been of benefit for him. I do not see any signs of infection right now which is great news and I think that he has a lot of new epithelial growth which is great to see as well. The patient is very pleased to hear all of this. I do think we can proceed with the Oasis trilayer #4 today. 3/18; not as much improvement in these areas on his heels that I was hoping. I did reapply trilateral Oasis today the tissue looks healthier but not as much fill in as I was hoping. 3/25; better looking today I think this is come in a bit the tissue looks healthier. Triple layer Oasis reapplied #6 4/1; somewhat better looking definitely better looking surface not as much change in surface area as I was hoping. He may be spending more time [place]  on days then he needs to although he does have heel offloading boots. Triple layer Oasis reapplied #7 Objective Constitutional Vitals Time Taken: 9:25 AM, Height: 69 in, Source: Stated, Weight: 280 lbs, Source: Stated, BMI: 41.3, Temperature: 98.1 F, Pulse: 82 bpm, Respiratory Rate: 18 breaths/min, Blood Pressure: 139/86 mmHg, Pulse Oximetry: 86 %. General Notes: O2 sats on room air. O2 sats increased to 94% with deep breathing. pt denies SOB Integumentary (Hair, Skin) Wound #1 status is Open. Original cause of wound was Pressure Injury. The date acquired was: 10/07/2019. The wound has been in treatment 16 weeks. The wound is located on the Right Calcaneus. The wound measures 4.8cm length x 2.2cm width x 0.5cm depth; 8.294cm^2 area and 4.147cm^3 volume. There is Fat Layer (Subcutaneous Tissue) exposed. There is no tunneling or undermining noted. There is a medium amount of serosanguineous drainage noted. The wound margin is thickened. There is medium (34-66%) pink, pale granulation within the wound bed. There is a medium (34-66%) amount of necrotic tissue within the wound bed including Adherent Slough. Wound #2 status is Open. Original cause of wound was Pressure Injury. The date acquired was: 10/07/2019. The wound has been in treatment 16 weeks. The wound is located on the Left Calcaneus. The wound measures 6cm length x 2.1cm width x 0.4cm depth; 9.896cm^2 area and 3.958cm^3 volume. There is Fat Layer (Subcutaneous Tissue) exposed. There is no tunneling or undermining noted. There is a medium amount of serosanguineous drainage noted. The wound margin is thickened. There is large (67-100%) pink granulation within the wound bed. There is a small (1-33%) amount of necrotic tissue within the wound bed including Adherent Slough. Assessment Active Problems ICD-10 Pressure ulcer of other site, stage 3 Muscle weakness (generalized) Essential (primary) hypertension Non-pressure chronic ulcer of other  part of right foot with fat layer exposed Non-pressure chronic ulcer of other 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 Procedures Wound #1 Pre-procedure diagnosis of Wound #1 is a Pressure Ulcer located on the Right Calcaneus. A skin graft procedure using a bioengineered skin substitute/cellular or tissue based product was performed by Ricard Dillon., MD with the following instrument(s): Forceps. Oasis tri-layer was applied and secured with Steri- Strips. 17 sq cm of product was utilized and 0 sq cm was wasted. Post Application, Adaptic, Drawtex was applied. A Time Out was conducted at 09:48, prior to the start of the procedure. Post procedure Diagnosis  Wound #1: Same as Pre-Procedure . Wound #2 Pre-procedure diagnosis of Wound #2 is a Pressure Ulcer located on the Left Calcaneus. A skin graft procedure using a bioengineered skin substitute/cellular or tissue based product was performed by Ricard Dillon., MD with the following instrument(s): Forceps. Oasis tri-layer was applied and secured with Steri- Strips. 18 sq cm of product was utilized and 0 sq cm was wasted. Post Application, Adaptic, Drawtex was applied. A Time Out was conducted at 09:48, prior to the start of the procedure. Post procedure Diagnosis Wound #2: Same as Pre-Procedure . Plan Follow-up Appointments: Return Appointment in 1 week. Cellular or Tissue Based Products: Wound #1 Right Calcaneus: Cellular or Tissue Based Product Type: - Oasis Trilayer to both wounds #5 10/23/20, #6 10/30/20, #7 11/06/20 Cellular or Tissue Based Product applied to wound bed, secured with steri-strips, cover with Adaptic or Mepitel. (DO NOT REMOVE). - ***Apply drawtex under adaptic, then 4x4 gauze, ABD pad secured with roll gauze and ACE wrap.** Wound #2 Left Calcaneus: Cellular or Tissue Based Product Type: - Oasis Trilayer to both wounds #6 Cellular or Tissue Based Product applied to wound bed, secured with steri-strips, cover with Adaptic  or Mepitel. (DO NOT REMOVE). - ***Apply drawtex under adaptic, then 4x4 gauze, ABD pad secured with roll gauze and ACE wrap.** Bathing/ Shower/ Hygiene: May shower with protection but do not get wound dressing(s) wet. Edema Control - Lymphedema / SCD / Other: Elevate legs to the level of the heart or above for 30 minutes daily and/or when sitting, a frequency of: Avoid standing for long periods of time. Off-Loading: Wedge shoe to: - Gloped to bilateral feet Other: - keep pressure off of the bottom of your feet 1. triple layer Oasis 2 backing drawtex ABD gauze 3. I may try casting the left foot next week to see if we can get any additional healing. Still using the triple layer Oasis Electronic Signature(s) Signed: 11/06/2020 5:01:47 PM By: Linton Ham MD Entered By: Linton Ham on 11/06/2020 10:03:41 -------------------------------------------------------------------------------- SuperBill Details Patient Name: Date of Service: [name], [place]. 11/06/2020 Medical Record Number: [id number] Patient Account Number: [id number] Date of Birth/Sex: Treating RN: [birth year] (45 y.o. [name] Primary Care Provider: [name] Other Clinician: Referring Provider: Treating Provider/Extender: [name] in Treatment: 16 Diagnosis Coding ICD-10 Codes Code Description [phone] Pressure ulcer of other site, stage 3 M62.81 Muscle weakness (generalized) I10 Essential (primary) hypertension L97.512 Non-pressure chronic ulcer of other part of right foot with fat layer exposed L97.522 Non-pressure chronic ulcer of other 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 Facility Procedures CPT4 Code: 63016010 Description: X3235 Oasis Ultra per sq CM ICD-10 Diagnosis Description L97.512 Non-pressure chronic ulcer of other part of right foot with fat layer exposed L97.522 Non-pressure chronic ulcer of other 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 Modifier: Quantity:  51 CPT4 Code: 57322025 1 Description: 5275 - SKIN SUB GRAFT FACE/NK/HF/G ICD-10 Diagnosis Description L97.512 Non-pressure chronic ulcer of other part of right foot with fat layer exposed L97.522 Non-pressure chronic ulcer of other 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 Modifier: Quantity: 1 Physician Procedures : CPT4 Code Description Modifier [id number] - WC PHYS SKIN SUB GRAFT FACE/NK/HF/G ICD-10 Diagnosis Description L97.512 Non-pressure chronic ulcer of other part of right foot with fat layer exposed L97.522 Non-pressure chronic ulcer of other part of  left foot with fat layer exposed Quantity: 1 Electronic Signature(s) Signed: 11/06/2020 5:01:47 PM By: Linton Ham MD Entered  By: Linton Ham on 11/06/2020 10:03:54

## 2020-11-07 DIAGNOSIS — U071 COVID-19: Secondary | ICD-10-CM | POA: Diagnosis not present

## 2020-11-07 DIAGNOSIS — G4734 Idiopathic sleep related nonobstructive alveolar hypoventilation: Secondary | ICD-10-CM | POA: Diagnosis not present

## 2020-11-09 ENCOUNTER — Telehealth: Payer: Self-pay

## 2020-11-09 NOTE — Progress Notes (Signed)
[name] ([id number]) Visit Report for 11/06/2020 Arrival Information Details Patient Name: Date of Service: [name], [place] 11/06/2020 9:30 A M Medical Record Number: [id number] Patient Account Number: [id number] Date of Birth/Sex: Treating RN: January 21, 1976 (45 y.o. [name], [name] Primary Care [name]: [name] Other Clinician: Referring [name]: Treating Elnathan Fulford/Extender: [name] in Treatment: 40 Visit Information History Since Last Visit Added or deleted any medications: No Patient Arrived: Wheel Chair Any new allergies or adverse reactions: No Arrival Time: 09:25 Had a fall or experienced change in No Accompanied By: spouse activities of daily living that may affect Transfer Assistance: None risk of falls: Patient Identification Verified: Yes Signs or symptoms of abuse/neglect since last No Secondary Verification Process Completed: Yes visito Patient Requires Transmission-Based Precautions: No Hospitalized since last visit: No Patient Has Alerts: No Implantable device outside of the clinic No excluding cellular tissue based products placed in the center since last visit: Has Dressing in Place as Prescribed: Yes Has Footwear/Offloading in Place as Yes Prescribed: Left: Other:globoped heel offloading sandals Right: Other:globoped heel offloading sandals Pain Present Now: Yes Electronic Signature(s) Signed: 11/06/2020 5:19:58 PM By: Baruch Gouty RN, BSN Entered By: Baruch Gouty on 11/06/2020 09:26:54 -------------------------------------------------------------------------------- Encounter Discharge Information Details Patient Name: Date of Service: [name], [place] E. 11/06/2020 9:30 A M Medical Record Number: [id number] Patient Account Number: [id number] Date of Birth/Sex: Treating RN: 10/29/1975 (44 y.o. [name] Primary Care Karee Forge: [name] Other Clinician: Referring Tenleigh Byer: Treating Savva Beamer/Extender:  [name] in Treatment: 16 Encounter Discharge Information Items Post Procedure Vitals Discharge Condition: Stable Temperature (F): 98.1 Ambulatory Status: Wheelchair Pulse (bpm): 82 Discharge Destination: Home Respiratory Rate (breaths/min): 18 Transportation: Private Auto Blood Pressure (mmHg): 139/86 Accompanied By: wife Schedule Follow-up Appointment: Yes Clinical Summary of Care: Electronic Signature(s) Signed: 11/06/2020 5:39:41 PM By: [name] Entered By: [name] on 11/06/2020 17:07:01 -------------------------------------------------------------------------------- Lower Extremity Assessment Details Patient Name: Date of Service: [name], [place] 11/06/2020 9:30 A M Medical Record Number: [id number] Patient Account Number: [id number] Date of Birth/Sex: Treating RN: [birth year] (45 y.o. [name] Primary Care Parthena Fergeson: [name] Other Clinician: Referring Halleigh Comes: Treating [name]/Extender: [name] in Treatment: 16 Edema Assessment Assessed: [name]: No] [name]: No] Edema: [Left: Yes] [Right: Yes] Calf Left: Right: Point of Measurement: From Medial Instep 44 cm 43.5 cm Ankle Left: Right: Point of Measurement: From Medial Instep 25 cm 24.8 cm Vascular Assessment Pulses: Dorsalis Pedis Palpable: [Left:Yes] [Right:Yes] Electronic Signature(s) Signed: 11/06/2020 5:19:58 PM By: Baruch Gouty RN, BSN Entered By: Baruch Gouty on 11/06/2020 09:24:52 -------------------------------------------------------------------------------- Multi Wound Chart Details Patient Name: Date of Service: [name], [place] 11/06/2020 9:30 A M Medical Record Number: [id number] Patient Account Number: [id number] Date of Birth/Sex: Treating RN: 06-03-1976 (45 y.o. [name] Primary Care Navdeep Halt: [name] Other Clinician: Referring Azion Centrella: Treating Mayte Diers/Extender: [name]  in Treatment: 16 Vital Signs Height(in): 84 Pulse(bpm): 50 Weight(lbs): 280 Blood Pressure(mmHg): 139/86 Body Mass Index(BMI): 41 Temperature(F): 98.1 Respiratory Rate(breaths/min): 18 Photos: [1:No Photos Right Calcaneus] [2:No Photos Left Calcaneus] [N/A:N/A N/A] Wound Location: [1:Pressure Injury] [2:Pressure Injury] [N/A:N/A] Wounding Event: [1:Pressure Ulcer] [2:Pressure Ulcer] [N/A:N/A] Primary Etiology: [1:Asthma, Angina, Hypertension] [2:Asthma, Angina, Hypertension] [N/A:N/A] Comorbid History: [1:10/07/2019] [2:10/07/2019] [N/A:N/A] Date Acquired: [1:16] [2:16] [N/A:N/A] Weeks of Treatment: [1:Open] [2:Open] [N/A:N/A] Wound Status: [1:4.8x2.2x0.5] [2:6x2.1x0.4] [N/A:N/A] Measurements L x W x D (cm) [1:8.294] [2:9.896] [N/A:N/A] A (cm) : rea [1:4.147] [2:3.958] [N/A:N/A] Volume (  cm) : [1:73.80%] [2:63.50%] [N/A:N/A] % Reduction in A rea: [1:86.90%] [2:85.40%] [N/A:N/A] % Reduction in Volume: [1:Category/Stage III] [2:Category/Stage III] [N/A:N/A] Classification: [1:Medium] [2:Medium] [N/A:N/A] Exudate A mount: [1:Serosanguineous] [2:Serosanguineous] [N/A:N/A] Exudate Type: [1:red, brown] [2:red, brown] [N/A:N/A] Exudate Color: [1:Thickened] [2:Thickened] [N/A:N/A] Wound Margin: [1:Medium (34-66%)] [2:Large (67-100%)] [N/A:N/A] Granulation A mount: [1:Pink, Pale] [2:Pink] [N/A:N/A] Granulation Quality: [1:Medium (34-66%)] [2:Small (1-33%)] [N/A:N/A] Necrotic A mount: [1:Fat Layer (Subcutaneous Tissue): Yes Fat Layer (Subcutaneous Tissue): Yes N/A] Exposed Structures: [1:Fascia: No Tendon: No Muscle: No Joint: No Bone: No Small (1-33%)] [2:Fascia: No Tendon: No Muscle: No Joint: No Bone: No Small (1-33%)] [N/A:N/A] Epithelialization: [1:Cellular or Tissue Based Product] [2:Cellular or Tissue Based Product] [N/A:N/A] Treatment Notes Electronic Signature(s) Signed: 11/06/2020 5:01:47 PM By: Linton Ham MD Signed: 11/06/2020 5:18:29 PM By: Lorrin Jackson Entered By: Linton Ham on 11/06/2020 10:00:21 -------------------------------------------------------------------------------- Multi-Disciplinary Care Plan Details Patient Name: Date of Service: [name], [name] 11/06/2020 9:30 A M Medical Record Number: [id number] Patient Account Number: [id number] Date of Birth/Sex: Treating RN: Jul 25, 1976 (45 y.o. [name] Primary Care Jennifer Payes: [name] Other Clinician: Referring Dasani Thurlow: Treating Earnstine Meinders/Extender: [name] in Treatment: [place] reviewed with physician Active Inactive Abuse / Safety / Falls / Self Care Management Nursing Diagnoses: Potential for falls Goals: Patient/caregiver will verbalize/demonstrate measures taken to prevent injury and/or falls Date Initiated: 07/15/2020 Target Resolution Date: 12/11/2020 Goal Status: Active Interventions: Assess fall risk on admission and as needed Assess impairment of mobility on admission and as needed per policy Notes: Wound/Skin Impairment Nursing Diagnoses: Impaired tissue integrity Knowledge deficit related to ulceration/compromised skin integrity Goals: Patient/caregiver will verbalize understanding of skin care regimen Date Initiated: 07/15/2020 Target Resolution Date: 12/04/2020 Goal Status: Active Ulcer/skin breakdown will have a volume reduction of 30% by week 4 Date Initiated: 07/15/2020 Date Inactivated: 08/20/2020 Target Resolution Date: 09/03/2020 Goal Status: Unmet Unmet Reason: no major changes. Interventions: Assess patient/caregiver ability to obtain necessary supplies Assess patient/caregiver ability to perform ulcer/skin care regimen upon admission and as needed Assess ulceration(s) every visit Provide education on ulcer and skin care Treatment Activities: Skin care regimen initiated : 07/15/2020 Topical wound management initiated : 07/15/2020 Notes: Electronic Signature(s) Signed: 11/06/2020 9:26:55 AM By: Lorrin Jackson Entered By: Lorrin Jackson on 11/06/2020 09:26:54 -------------------------------------------------------------------------------- Pain Assessment Details Patient Name: Date of Service: [name], [place] E. 11/06/2020 9:30 A M Medical Record Number: [id number] Patient Account Number: [id number] Date of Birth/Sex: Treating RN: 03-27-1976 (45 y.o. [name] Primary Care Christelle Igoe: [name] Other Clinician: Referring Enslee Bibbins: Treating Sora Olivo/Extender: [name] in Treatment: 16 Active Problems Location of Pain Severity and Description of Pain Patient Has Paino Yes Site Locations Pain Location: Pain in Ulcers With Dressing Change: Yes Duration of the Pain. Constant / Intermittento Intermittent Rate the pain. Current Pain Level: 3 Least Pain Level: 0 Character of Pain Describe the Pain: Aching Pain Management and Medication Current Pain Management: Other: tolerable Is the Current Pain Management Adequate: Adequate How does your wound impact your activities of daily livingo Sleep: No Bathing: No Appetite: No Relationship With Others: No Bladder Continence: No Emotions: No Bowel Continence: No Work: No Toileting: No Drive: No Dressing: No Hobbies: No Electronic Signature(s) Signed: 11/06/2020 5:19:58 PM By: Baruch Gouty RN, BSN Entered By: Baruch Gouty on 11/06/2020 09:30:11 -------------------------------------------------------------------------------- Patient/Caregiver Education Details Patient Name: Date of Service: Ciro Backer 4/1/2022andnbsp9:30 [place] Record Number: [id number] Patient Account Number: [id number] Date of Birth/Gender:  Treating RN: Dec 29, 1975 (45 y.o. [name] Primary Care Physician: [name] Other Clinician: Referring Physician: Treating Physician/Extender: [name] in Treatment: 16 Education Assessment Education Provided To: Patient Education  Topics Provided Offloading: Methods: Explain/Verbal, Printed Responses: State content correctly Wound/Skin Impairment: Methods: Explain/Verbal, Printed Responses: State content correctly Electronic Signature(s) Signed: 11/06/2020 5:18:29 PM By: Lorrin Jackson Entered By: Lorrin Jackson on 11/06/2020 09:27:25 -------------------------------------------------------------------------------- Wound Assessment Details Patient Name: Date of Service: [name], [place] 11/06/2020 9:30 A M Medical Record Number: [id number] Patient Account Number: [id number] Date of Birth/Sex: Treating RN: [birth year] (45 y.o. [name] Primary Care Lizeth Bencosme: [name] Other Clinician: Referring Lindsi Bayliss: Treating Laithan Conchas/Extender: [name] in Treatment: 16 Wound Status Wound Number: 1 Primary Etiology: Pressure Ulcer Wound Location: Right Calcaneus Wound Status: Open Wounding Event: Pressure Injury Comorbid History: Asthma, Angina, Hypertension Date Acquired: 10/07/2019 Weeks Of Treatment: 16 Clustered Wound: No Photos Wound Measurements Length: (cm) 4.8 Width: (cm) 2.2 Depth: (cm) 0.5 Area: (cm) 8.294 Volume: (cm) 4.147 % Reduction in Area: 73.8% % Reduction in Volume: 86.9% Epithelialization: Small (1-33%) Tunneling: No Undermining: No Wound Description Classification: Category/Stage III Wound Margin: Thickened Exudate Amount: Medium Exudate Type: Serosanguineous Exudate Color: red, brown Foul Odor After Cleansing: No Slough/Fibrino Yes Wound Bed Granulation Amount: Medium (34-66%) Exposed Structure Granulation Quality: Pink, Pale Fascia Exposed: No Necrotic Amount: Medium (34-66%) Fat Layer (Subcutaneous Tissue) Exposed: Yes Necrotic Quality: Adherent Slough Tendon Exposed: No Muscle Exposed: No Joint Exposed: No Bone Exposed: No Treatment Notes Wound #1 (Calcaneus) Wound Laterality: Right Cleanser Peri-Wound Care Topical Primary  Dressing Secondary Dressing Secured With Compression Wrap Compression Stockings Add-Ons Notes primary dressing oasis applied to wounds by MD. drawtex, adaptic, steri-strips, gauze, abd pad, and kerlix. offloading heel sandal bilateral feet. Electronic Signature(s) Signed: 11/06/2020 5:19:58 PM By: Baruch Gouty RN, BSN Signed: 11/09/2020 10:23:55 AM By: Sandre Kitty Entered By: Sandre Kitty on 11/06/2020 16:47:19 -------------------------------------------------------------------------------- Wound Assessment Details Patient Name: Date of Service: [name], [place] 11/06/2020 9:30 A M Medical Record Number: [id number] Patient Account Number: [id number] Date of Birth/Sex: Treating RN: 10/25/1975 (45 y.o. [name], [name] Primary Care Jolonda Gomm: [name] Other Clinician: Referring Vale Mousseau: Treating Yadira Hada/Extender: [name] in Treatment: 16 Wound Status Wound Number: 2 Primary Etiology: Pressure Ulcer Wound Location: Left Calcaneus Wound Status: Open Wounding Event: Pressure Injury Comorbid History: Asthma, Angina, Hypertension Date Acquired: 10/07/2019 Weeks Of Treatment: 16 Clustered Wound: No Photos Wound Measurements Length: (cm) 6 Width: (cm) 2.1 Depth: (cm) 0.4 Area: (cm) 9.896 Volume: (cm) 3.958 % Reduction in Area: 63.5% % Reduction in Volume: 85.4% Epithelialization: Small (1-33%) Tunneling: No Undermining: No Wound Description Classification: Category/Stage III Wound Margin: Thickened Exudate Amount: Medium Exudate Type: Serosanguineous Exudate Color: red, brown Foul Odor After Cleansing: No Slough/Fibrino Yes Wound Bed Granulation Amount: Large (67-100%) Exposed Structure Granulation Quality: Pink Fascia Exposed: No Necrotic Amount: Small (1-33%) Fat Layer (Subcutaneous Tissue) Exposed: Yes Necrotic Quality: Adherent Slough Tendon Exposed: No Muscle Exposed: No Joint Exposed: No Bone Exposed: No Treatment  Notes Wound #2 (Calcaneus) Wound Laterality: Left Cleanser Peri-Wound Care Topical Primary Dressing Secondary Dressing Secured With Compression Wrap Compression Stockings Add-Ons Notes primary dressing oasis applied to wounds by MD. drawtex, adaptic, steri-strips, gauze, abd pad, and kerlix. offloading heel sandal bilateral feet. Electronic Signature(s) Signed: 11/06/2020 5:19:58 PM By: Baruch Gouty RN, BSN Signed: 11/09/2020 10:23:55 AM By: Sandre Kitty Entered By: Sandre Kitty on 11/06/2020 16:46:59 -------------------------------------------------------------------------------- Vitals Details Patient Name:  Date of Service: [name], [place] 11/06/2020 9:30 A M Medical Record Number: [id number] Patient Account Number: [id number] Date of Birth/Sex: Treating RN: 02-24-1976 (45 y.o. [name] Primary Care Falan Hensler: [name] Other Clinician: Referring Tamrah Victorino: Treating Wael Maestas/Extender: [name] in Treatment: 16 Vital Signs Time Taken: 09:25 Temperature (F): 98.1 Height (in): 69 Pulse (bpm): 82 Source: Stated Respiratory Rate (breaths/min): 18 Weight (lbs): 280 Blood Pressure (mmHg): 139/86 Source: Stated Reference Range: 80 - 120 mg / dl Body Mass Index (BMI): 41.3 Airway Pulse Oximetry (%): 86 Notes O2 sats on room air. O2 sats increased to 94% with deep breathing. pt denies SOB Electronic Signature(s) Signed: 11/06/2020 5:19:58 PM By: Baruch Gouty RN, BSN Entered By: Baruch Gouty on 11/06/2020 09:29:17

## 2020-11-09 NOTE — Telephone Encounter (Signed)
PA for Hydrocodone/APAP 5/325 mg has been submitted.

## 2020-11-09 NOTE — Telephone Encounter (Signed)
Entered in error

## 2020-11-09 NOTE — Telephone Encounter (Signed)
Approved 11/09/20-11/23/20. Pharmacy notified

## 2020-11-10 ENCOUNTER — Other Ambulatory Visit: Payer: Self-pay | Admitting: *Deleted

## 2020-11-11 ENCOUNTER — Other Ambulatory Visit: Payer: Self-pay | Admitting: Physical Medicine and Rehabilitation

## 2020-11-11 MED ORDER — HYDROCODONE-ACETAMINOPHEN 5-325 MG PO TABS: 1.0000 | ORAL_TABLET | Freq: Four times a day (QID) | ORAL | 0 refills | Status: DC | PRN

## 2020-11-12 ENCOUNTER — Encounter (HOSPITAL_BASED_OUTPATIENT_CLINIC_OR_DEPARTMENT_OTHER): Payer: BC Managed Care – PPO | Admitting: Internal Medicine

## 2020-11-12 ENCOUNTER — Other Ambulatory Visit: Payer: Self-pay

## 2020-11-12 DIAGNOSIS — Z833 Family history of diabetes mellitus: Secondary | ICD-10-CM | POA: Diagnosis not present

## 2020-11-12 DIAGNOSIS — L97522 Non-pressure chronic ulcer of other 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: Secondary | ICD-10-CM | POA: Diagnosis not present

## 2020-11-12 DIAGNOSIS — Z8616 Personal history of COVID-19: Secondary | ICD-10-CM | POA: Diagnosis not present

## 2020-11-12 DIAGNOSIS — L89893 Pressure ulcer of other site, stage 3: Secondary | ICD-10-CM | POA: Diagnosis not present

## 2020-11-12 DIAGNOSIS — I10 Essential (primary) hypertension: Secondary | ICD-10-CM | POA: Diagnosis not present

## 2020-11-12 DIAGNOSIS — L97412 Non-pressure chronic ulcer of right heel and midfoot with fat layer exposed: Secondary | ICD-10-CM | POA: Diagnosis not present

## 2020-11-12 DIAGNOSIS — L97422 Non-pressure chronic ulcer of left heel and midfoot with fat layer exposed: Secondary | ICD-10-CM | POA: Diagnosis not present

## 2020-11-12 DIAGNOSIS — Z8249 Family history of ischemic heart disease and other diseases of the circulatory system: Secondary | ICD-10-CM | POA: Diagnosis not present

## 2020-11-12 DIAGNOSIS — L97512 Non-pressure chronic ulcer of other part of right foot with fat layer exposed: Secondary | ICD-10-CM | POA: Diagnosis not present

## 2020-11-12 NOTE — Telephone Encounter (Signed)
[facility]  Id# [id number]  RxBin# [id number]  RxPcn# [id number]  RxGrp# [facility]  [phone] specialty pharmacy #

## 2020-11-13 DIAGNOSIS — S91309A Unspecified open wound, unspecified foot, initial encounter: Secondary | ICD-10-CM | POA: Diagnosis not present

## 2020-11-13 NOTE — Progress Notes (Signed)
CRANFORD, BLESSINGER ([id number]) Visit Report for 11/12/2020 HPI Details Patient Name: Date of Service: [name], [place]. 11/12/2020 8:45 A M Medical Record Number: [id number] Patient Account Number: [id number] Date of Birth/Sex: Treating RN: 1976/06/03 (45 y.o. [name] Primary Care Provider: [name] Other Clinician: Referring Provider: Treating Provider/Extender: [name] in Treatment: 17 History of Present Illness HPI Description: 07/15/2020 upon evaluation today patient presents for initial inspection here in our clinic concerning issues he has been having with the bottoms of his feet bilaterally. He states these actually occurred as wounds when he was hospitalized for 5 months secondary to Covid. He was apparently with tilting bed where he was in an upright position quite frequently and apparently this occurred in some way shape or form during that time. Fortunately there is no sign of active infection at this time. No fevers, chills, nausea, vomiting, or diarrhea. With that being said he still has substantial wounds on the plantar aspects of his feet Theragen require quite a bit of work to get these to heal. He has been using Santyl currently though that is been problematic both in receiving the medication as well as actually paid for it as it is become quite expensive. Prior to the experience with Covid the patient really did not have any major medical problems other than hypertension he does have some mild generalized weakness following the Covid experience. 07/22/2020 on evaluation today patient appears to be doing okay in regard to his foot ulcers I feel like the wound beds are showing signs of better improvement that I do believe the Iodoflex is helping in this regard. With that being said he does have a lot of drainage currently and this is somewhat blue/green in nature which is consistent with Pseudomonas. I do think a culture today would be appropriate  for [place] to evaluate and see if that is indeed the case I would likely start him on antibiotic orally as well he is not allergic to Cipro knows of no issues he has had in the past 12/21; patient was admitted to the clinic earlier this month with bilateral presumed pressure ulcers on the bottom of his feet apparently related to excessive pressure from a tilt table arrangement in the intensive care unit. Patient relates this to being on ECMO but I am not really sure that is exactly related to that. I must say I have never seen anything like this. He has fairly extensive full-thickness wounds extending from his heel towards his midfoot mostly centered laterally. There is already been some healing distally. He does not appear to have an arterial issue. He has been using gentamicin to the wound surfaces with Iodoflex to help with ongoing debridement 1/6; this is a patient with pressure ulcers on the bottom of his feet related to excessive pressure from a standing position in the intensive care unit. He is complaining of a lot of pain in the right heel. He is not a diabetic. 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 We have been using Iodoflex to help prepare the surfaces of both wounds for an advanced treatment product. He is nonambulatory spending most of his time in a wheelchair I have asked him not to propel the wheelchair with his heels 1/13; in general his wounds look better not much surface area change we have been using Iodoflex as of last week. I did an x-ray of the right heel as the patient was complaining of pain. I  had some thoughts about a stress fracture perhaps Achilles tendon problems however what it showed was erosive changes along the inferior aspect of the calcaneus he now has a MRI booked for 1/20. 1/20; in general his wounds continue to be better. Some improvement in the large narrow areas proximally in his foot. He is still complaining of pain in the right heel  and tenderness in certain areas of this wound. His MRI is tonight. I am not just looking for osteomyelitis that was brought up on the x-ray I am wondering about stress fractures, tendon ruptures etc. He has no such findings on the left. Also noteworthy is that the patient had critical illness neuropathy and some of the discomfort may be actual improvement in nerve function I am just not sure. These wounds were initially in the setting of severe critical illness related to COVID-19. He was put in a standing position. He may have also been on pressors at the point contributing to tissue ischemia. By his description at some point these wounds were grossly necrotic extending proximally up into the Achilles part of his heel. I do not know that I have ever really seen pictures of them like this although they may exist in epic We have ordered Tri layer Oasis. I am trying to stimulate some granulation in these areas. This is of course assuming the MRI is negative for infection 1/27; since the patient was last here he saw Dr. Juleen China of infectious disease. He is planned for vancomycin and ceftriaxone. Prior operative culture grew MSSA. Also ordered baseline lab work. He also ordered arterial studies although the ABIs in our clinic were normal as well as his clinical exam these were normal I do not think he needs to see vascular surgery. His ABIs at the PTA were 1.22 in the right triphasic waveforms with a normal TBI of 1.15 on the left ABI of 1.22 with triphasic waveforms and a normal TBI of 1.08. Finally he saw Dr. Amalia Hailey who will follow him in for 2 months. At this point I do not think he felt that he needed a procedure on the right calcaneal bone. Dr. Juleen China is elected for broad-spectrum antibiotic The patient is still having pain in the right heel. He walks with a walker 2/3; wounds are generally smaller. He is tolerating his IV antibiotics. I believe this is vancomycin and ceftriaxone. We are still  waiting for Oasis burn in terms of his out-of-pocket max which he should be meeting soon given the IV antibiotics, MRIs etc. I have asked him to check in on this. We are using silver collagen in the meantime the wounds look better 2/10; tolerating IV vancomycin and Rocephin. We are waiting to apply for Oasis. Although I am not really sure where he is in his out-of-pocket max. 2/17 started the first application of Oasis trilayer. Still on antibiotics. The wounds have generally look better. The area on the left has a little more surface slough requiring debridement 0/27; second application of Oasis trilayer. The wound surface granulation is generally look better. The area on the left with undermining laterally I think is come in a bit. 10/08/2020 upon evaluation today patient is here today for [employer] application #3. Fortunately he seems to be doing extremely well with regard to this and we are seeing a lot of new epithelial growth which is great news. Fortunately there is no signs of active infection at this time. 10/16/2020 upon evaluation today patient appears to be doing well with regard  to his foot ulcers. Do believe the Oasis has been of benefit for him. I do not see any signs of infection right now which is great news and I think that he has a lot of new epithelial growth which is great to see as well. The patient is very pleased to hear all of this. I do think we can proceed with the Oasis trilayer #4 today. 3/18; not as much improvement in these areas on his heels that I was hoping. I did reapply trilateral Oasis today the tissue looks healthier but not as much fill in as I was hoping. 3/25; better looking today I think this is come in a bit the tissue looks healthier. Triple layer Oasis reapplied #6 4/1; somewhat better looking definitely better looking surface not as much change in surface area as I was hoping. He may be spending more time [place] on days then he needs to although he does  have heel offloading boots. Triple layer Oasis reapplied #7 4/7; unfortunately apparently [facility] will not approve any further Oasis which is unfortunate since the patient did respond nicely both in terms of the condition of the wound bed as well as surface area. There is still some drainage coming from the wound but not a lot there does not appear to be any infection Electronic Signature(s) Signed: 11/12/2020 5:22:04 PM By: Linton Ham MD Entered By: Linton Ham on 11/12/2020 10:52:22 -------------------------------------------------------------------------------- Physical Exam Details Patient Name: Date of Service: [name], [place] E. 11/12/2020 8:45 A M Medical Record Number: [id number] Patient Account Number: [id number] Date of Birth/Sex: Treating RN: 1975/10/08 (45 y.o. [name] Primary Care Provider: [name] Other Clinician: Referring Provider: Treating Provider/Extender: [name] in Treatment: 8 Constitutional Patient is hypertensive.. Pulse regular and within target range for patient.Marland Kitchen Respirations regular, non-labored and within target range.. Temperature is normal and within the target range for the patient.Marland Kitchen Appears in no distress. Cardiovascular Pedal pulses are palpable. Notes Wound exam; much better looking wound surface with healthy granulation there is advancing epithelialization although admittedly the change in surface area recently has not been robust. We are going to use Hydrofera Blue backing drawtex ABDs. There is no obvious evidence of infection in any area. No cultures were done I do not believe he needs any imaging. Electronic Signature(s) Signed: 11/12/2020 5:22:04 PM By: Linton Ham MD Entered By: Linton Ham on 11/12/2020 10:53:40 -------------------------------------------------------------------------------- Physician Orders Details Patient Name: Date of Service: [name], [place]. 11/12/2020  8:45 A M Medical Record Number: [id number] Patient Account Number: [id number] Date of Birth/Sex: Treating RN: 1976/05/18 (45 y.o. 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 Bathing/ Shower/ Hygiene May shower with protection but do not get wound dressing(s) wet. Edema Control - Lymphedema / SCD / Other Bilateral Lower Extremities Elevate legs to the level of the heart or above for 30 minutes daily and/or when sitting, a frequency of: Avoid standing for long periods of time. Off-Loading Wedge shoe to: - Gloped to bilateral feet Other: - keep pressure  off of the bottom of your feet Wound Treatment Wound #1 - Calcaneus Wound Laterality: Right Cleanser: Normal Saline (Generic) Every Other Day/15 Days Discharge Instructions: Cleanse the wound with Normal Saline prior to applying a clean dressing using gauze sponges, not tissue or cotton balls. Cleanser: Wound Cleanser Every Other Day/15 Days Discharge Instructions: Cleanse the wound with wound cleanser prior to applying a clean dressing using gauze sponges, not tissue or cotton balls. Prim Dressing: Hydrofera Blue Classic Foam, 4x4 in (DME) (Dispense As Written) Every Other Day/15 Days ary Discharge Instructions: Moisten with saline prior to applying to wound bed Secondary Dressing: Woven Gauze Sponge, Non-Sterile 4x4 in (DME) (Generic) Every Other Day/15 Days Discharge Instructions: Apply over primary  dressing as directed. Secondary Dressing: ABD Pad, 8x10 (Generic) Every Other Day/15 Days Discharge Instructions: Apply over primary dressing as directed. Secondary Dressing: Zetuvit Plus 4x8 in (DME) (Generic) Every Other Day/15 Days Discharge Instructions: Apply over primary dressing as directed. Secured With: Elastic Bandage 4 inch (ACE bandage) Every Other Day/15 Days Discharge Instructions: Secure with ACE bandage as directed. Secured With: [employer], 4.5x3.1 (in/yd) (Generic) Every Other Day/15 Days Discharge Instructions: Secure with Kerlix as directed. Wound #2 - Calcaneus Wound Laterality: Left Cleanser: Normal Saline (Generic) Every Other Day/15 Days Discharge Instructions: Cleanse the wound with Normal Saline prior to applying a clean dressing using gauze sponges, not tissue or cotton balls. Cleanser: Wound Cleanser Every Other Day/15 Days Discharge Instructions: Cleanse the wound with wound cleanser prior to applying a clean dressing using gauze sponges, not tissue or cotton balls. Prim Dressing: Hydrofera Blue Classic Foam, 4x4 in (DME) (Dispense As Written) Every Other Day/15 Days ary Discharge Instructions: Moisten with saline prior to applying to wound bed Secondary Dressing: Woven Gauze Sponge, Non-Sterile 4x4 in (DME) (Generic) Every Other Day/15 Days Discharge Instructions: Apply over primary dressing as directed. Secondary Dressing: ABD Pad, 8x10 (Generic) Every Other Day/15 Days Discharge Instructions: Apply over primary dressing as directed. Secondary Dressing: Zetuvit Plus 4x8 in (DME) (Generic) Every Other Day/15 Days Discharge Instructions: Apply over primary dressing as directed. Secured With: Elastic Bandage 4 inch (ACE bandage) Every Other Day/15 Days Discharge Instructions: Secure with ACE bandage as directed. Secured With: [employer], 4.5x3.1 (in/yd) (Generic) Every Other Day/15 Days Discharge Instructions: Secure with Kerlix as  directed. Electronic Signature(s) Signed: 11/12/2020 5:22:04 PM By: Linton Ham MD Signed: 11/13/2020 4:53:51 PM By: [name] Entered By: [name] on 11/12/2020 09:58:27 -------------------------------------------------------------------------------- Problem List Details Patient Name: Date of Service: [name], [place]. 11/12/2020 8:45 A M Medical Record Number: [id number] Patient Account Number: [id number] Date of Birth/Sex: Treating RN: 1976-01-03 (45 y.o. [name] Primary Care Provider: [name] Other Clinician: Referring Provider: Treating Provider/Extender: [name] in Treatment: 17 Active Problems ICD-10 Encounter Code Description Active Date MDM Diagnosis [phone] Pressure ulcer of other site, stage 3 07/15/2020 No Yes M62.81 Muscle weakness (generalized) 07/15/2020 No Yes I10 Essential (primary) hypertension 07/15/2020 No Yes L97.512 Non-pressure chronic ulcer of other part of right foot with fat layer exposed 09/03/2020 No Yes L97.522 Non-pressure chronic ulcer of other 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 09/03/2020 No Yes Inactive Problems ICD-10 Code Description Active Date Inactive Date M86.171 Other acute osteomyelitis, right ankle and foot 09/03/2020 09/03/2020 Resolved Problems Electronic Signature(s) Signed: 11/12/2020 5:22:04 PM By: Linton Ham MD Entered By: Linton Ham on 11/12/2020 10:51:19 -------------------------------------------------------------------------------- Progress Note Details Patient Name: Date of Service: [name], [place]. 11/12/2020 8:45  A M Medical Record Number: [id number] Patient Account Number: [id number] Date of Birth/Sex: Treating RN: March 29, 1976 (45 y.o. [name] Primary Care Provider: [name] Other Clinician: Referring Provider: Treating Provider/Extender: [name] in Treatment: 17 Subjective History of Present Illness (HPI) 07/15/2020 upon  evaluation today patient presents for initial inspection here in our clinic concerning issues he has been having with the bottoms of his feet bilaterally. He states these actually occurred as wounds when he was hospitalized for 5 months secondary to Covid. He was apparently with tilting bed where he was in an upright position quite frequently and apparently this occurred in some way shape or form during that time. Fortunately there is no sign of active infection at this time. No fevers, chills, nausea, vomiting, or diarrhea. With that being said he still has substantial wounds on the plantar aspects of his feet Theragen require quite a bit of work to get these to heal. He has been using Santyl currently though that is been problematic both in receiving the medication as well as actually paid for it as it is become quite expensive. Prior to the experience with Covid the patient really did not have any major medical problems other than hypertension he does have some mild generalized weakness following the Covid experience. 07/22/2020 on evaluation today patient appears to be doing okay in regard to his foot ulcers I feel like the wound beds are showing signs of better improvement that I do believe the Iodoflex is helping in this regard. With that being said he does have a lot of drainage currently and this is somewhat blue/green in nature which is consistent with Pseudomonas. I do think a culture today would be appropriate for [place] to evaluate and see if that is indeed the case I would likely start him on antibiotic orally as well he is not allergic to Cipro knows of no issues he has had in the past 12/21; patient was admitted to the clinic earlier this month with bilateral presumed pressure ulcers on the bottom of his feet apparently related to excessive pressure from a tilt table arrangement in the intensive care unit. Patient relates this to being on ECMO but I am not really sure that is exactly related  to that. I must say I have never seen anything like this. He has fairly extensive full-thickness wounds extending from his heel towards his midfoot mostly centered laterally. There is already been some healing distally. He does not appear to have an arterial issue. He has been using gentamicin to the wound surfaces with Iodoflex to help with ongoing debridement 1/6; this is a patient with pressure ulcers on the bottom of his feet related to excessive pressure from a standing position in the intensive care unit. He is complaining of a lot of pain in the right heel. He is not a diabetic. 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 We have been using Iodoflex to help prepare the surfaces of both wounds for an advanced treatment product. He is nonambulatory spending most of his time in a wheelchair I have asked him not to propel the wheelchair with his heels 1/13; in general his wounds look better not much surface area change we have been using Iodoflex as of last week. I did an x-ray of the right heel as the patient was complaining of pain. I had some thoughts about a stress fracture perhaps Achilles tendon problems however what it showed was erosive changes along the inferior  aspect of the calcaneus he now has a MRI booked for 1/20. 1/20; in general his wounds continue to be better. Some improvement in the large narrow areas proximally in his foot. He is still complaining of pain in the right heel and tenderness in certain areas of this wound. His MRI is tonight. I am not just looking for osteomyelitis that was brought up on the x-ray I am wondering about stress fractures, tendon ruptures etc. He has no such findings on the left. Also noteworthy is that the patient had critical illness neuropathy and some of the discomfort may be actual improvement in nerve function I am just not sure. These wounds were initially in the setting of severe critical illness related to COVID-19. He was  put in a standing position. He may have also been on pressors at the point contributing to tissue ischemia. By his description at some point these wounds were grossly necrotic extending proximally up into the Achilles part of his heel. I do not know that I have ever really seen pictures of them like this although they may exist in epic We have ordered Tri layer Oasis. I am trying to stimulate some granulation in these areas. This is of course assuming the MRI is negative for infection 1/27; since the patient was last here he saw Dr. Juleen China of infectious disease. He is planned for vancomycin and ceftriaxone. Prior operative culture grew MSSA. Also ordered baseline lab work. He also ordered arterial studies although the ABIs in our clinic were normal as well as his clinical exam these were normal I do not think he needs to see vascular surgery. His ABIs at the PTA were 1.22 in the right triphasic waveforms with a normal TBI of 1.15 on the left ABI of 1.22 with triphasic waveforms and a normal TBI of 1.08. Finally he saw Dr. Amalia Hailey who will follow him in for 2 months. At this point I do not think he felt that he needed a procedure on the right calcaneal bone. Dr. Juleen China is elected for broad-spectrum antibiotic The patient is still having pain in the right heel. He walks with a walker 2/3; wounds are generally smaller. He is tolerating his IV antibiotics. I believe this is vancomycin and ceftriaxone. We are still waiting for Oasis burn in terms of his out-of-pocket max which he should be meeting soon given the IV antibiotics, MRIs etc. I have asked him to check in on this. We are using silver collagen in the meantime the wounds look better 2/10; tolerating IV vancomycin and Rocephin. We are waiting to apply for Oasis. Although I am not really sure where he is in his out-of-pocket max. 2/17 started the first application of Oasis trilayer. Still on antibiotics. The wounds have generally look better. The  area on the left has a little more surface slough requiring debridement 7/10; second application of Oasis trilayer. The wound surface granulation is generally look better. The area on the left with undermining laterally I think is come in a bit. 10/08/2020 upon evaluation today patient is here today for [employer] application #3. Fortunately he seems to be doing extremely well with regard to this and we are seeing a lot of new epithelial growth which is great news. Fortunately there is no signs of active infection at this time. 10/16/2020 upon evaluation today patient appears to be doing well with regard to his foot ulcers. Do believe the Oasis has been of benefit for him. I do not see any signs of  infection right now which is great news and I think that he has a lot of new epithelial growth which is great to see as well. The patient is very pleased to hear all of this. I do think we can proceed with the Oasis trilayer #4 today. 3/18; not as much improvement in these areas on his heels that I was hoping. I did reapply trilateral Oasis today the tissue looks healthier but not as much fill in as I was hoping. 3/25; better looking today I think this is come in a bit the tissue looks healthier. Triple layer Oasis reapplied #6 4/1; somewhat better looking definitely better looking surface not as much change in surface area as I was hoping. He may be spending more time [place] on days then he needs to although he does have heel offloading boots. Triple layer Oasis reapplied #7 4/7; unfortunately apparently [facility] will not approve any further Oasis which is unfortunate since the patient did respond nicely both in terms of the condition of the wound bed as well as surface area. There is still some drainage coming from the wound but not a lot there does not appear to be any infection Objective Constitutional Patient is hypertensive.. Pulse regular and within target range for patient.Marland Kitchen  Respirations regular, non-labored and within target range.. Temperature is normal and within the target range for the patient.Marland Kitchen Appears in no distress. Vitals Time Taken: 8:54 AM, Height: 69 in, Weight: 280 lbs, BMI: 41.3, Temperature: 98.7 F, Pulse: 88 bpm, Respiratory Rate: 18 breaths/min, Blood Pressure: 166/78 mmHg. Cardiovascular Pedal pulses are palpable. General Notes: Wound exam; much better looking wound surface with healthy granulation there is advancing epithelialization although admittedly the change in surface area recently has not been robust. We are going to use Hydrofera Blue backing drawtex ABDs. There is no obvious evidence of infection in any area. No cultures were done I do not believe he needs any imaging. Integumentary (Hair, Skin) Wound #1 status is Open. Original cause of wound was Pressure Injury. The date acquired was: 10/07/2019. The wound has been in treatment 17 weeks. The wound is located on the Right Calcaneus. The wound measures 4.5cm length x 2cm width x 0.6cm depth; 7.069cm^2 area and 4.241cm^3 volume. There is Fat Layer (Subcutaneous Tissue) exposed. There is no tunneling or undermining noted. There is a medium amount of serosanguineous drainage noted. The wound margin is thickened. There is medium (34-66%) pink, pale granulation within the wound bed. There is a medium (34-66%) amount of necrotic tissue within the wound bed including Adherent Slough. Wound #2 status is Open. Original cause of wound was Pressure Injury. The date acquired was: 10/07/2019. The wound has been in treatment 17 weeks. The wound is located on the Left Calcaneus. The wound measures 5cm length x 2cm width x 0.3cm depth; 7.854cm^2 area and 2.356cm^3 volume. There is Fat Layer (Subcutaneous Tissue) exposed. There is no tunneling noted, however, there is undermining starting at 1:00 and ending at 4:00 with a maximum distance of 0.4cm. There is a medium amount of serosanguineous drainage noted.  The wound margin is thickened. There is large (67-100%) pink granulation within the wound bed. There is a small (1-33%) amount of necrotic tissue within the wound bed. Assessment Active Problems ICD-10 Pressure ulcer of other site, stage 3 Muscle weakness (generalized) Essential (primary) hypertension Non-pressure chronic ulcer of other part of right foot with fat layer exposed Non-pressure chronic ulcer of other 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  Plan Follow-up Appointments: Return Appointment in 1 week. Bathing/ Shower/ Hygiene: May shower with protection but do not get wound dressing(s) wet. Edema Control - Lymphedema / SCD / Other: Elevate legs to the level of the heart or above for 30 minutes daily and/or when sitting, a frequency of: Avoid standing for long periods of time. Off-Loading: Wedge shoe to: - Gloped to bilateral feet Other: - keep pressure off of the bottom of your feet WOUND #1: - Calcaneus Wound Laterality: Right Cleanser: Normal Saline (Generic) Every Other Day/15 Days Discharge Instructions: Cleanse the wound with Normal Saline prior to applying a clean dressing using gauze sponges, not tissue or cotton balls. Cleanser: Wound Cleanser Every Other Day/15 Days Discharge Instructions: Cleanse the wound with wound cleanser prior to applying a clean dressing using gauze sponges, not tissue or cotton balls. Prim Dressing: Hydrofera Blue Classic Foam, 4x4 in (DME) (Dispense As Written) Every Other Day/15 Days ary Discharge Instructions: Moisten with saline prior to applying to wound bed Secondary Dressing: Woven Gauze Sponge, Non-Sterile 4x4 in (DME) (Generic) Every Other Day/15 Days Discharge Instructions: Apply over primary dressing as directed. Secondary Dressing: ABD Pad, 8x10 (Generic) Every Other Day/15 Days Discharge Instructions: Apply over primary dressing as directed. Secondary Dressing: Zetuvit Plus 4x8 in (DME) (Generic) Every Other Day/15  Days Discharge Instructions: Apply over primary dressing as directed. Secured With: Elastic Bandage 4 inch (ACE bandage) Every Other Day/15 Days Discharge Instructions: Secure with ACE bandage as directed. Secured With: [employer], 4.5x3.1 (in/yd) (Generic) Every Other Day/15 Days Discharge Instructions: Secure with Kerlix as directed. WOUND #2: - Calcaneus Wound Laterality: Left Cleanser: Normal Saline (Generic) Every Other Day/15 Days Discharge Instructions: Cleanse the wound with Normal Saline prior to applying a clean dressing using gauze sponges, not tissue or cotton balls. Cleanser: Wound Cleanser Every Other Day/15 Days Discharge Instructions: Cleanse the wound with wound cleanser prior to applying a clean dressing using gauze sponges, not tissue or cotton balls. Prim Dressing: Hydrofera Blue Classic Foam, 4x4 in (DME) (Dispense As Written) Every Other Day/15 Days ary Discharge Instructions: Moisten with saline prior to applying to wound bed Secondary Dressing: Woven Gauze Sponge, Non-Sterile 4x4 in (DME) (Generic) Every Other Day/15 Days Discharge Instructions: Apply over primary dressing as directed. Secondary Dressing: ABD Pad, 8x10 (Generic) Every Other Day/15 Days Discharge Instructions: Apply over primary dressing as directed. Secondary Dressing: Zetuvit Plus 4x8 in (DME) (Generic) Every Other Day/15 Days Discharge Instructions: Apply over primary dressing as directed. Secured With: Elastic Bandage 4 inch (ACE bandage) Every Other Day/15 Days Discharge Instructions: Secure with ACE bandage as directed. Secured With: [employer], 4.5x3.1 (in/yd) (Generic) Every Other Day/15 Days Discharge Instructions: Secure with Kerlix as directed. 1. Unfortunately no further triple lower Oasis available. 2. We are going to use Hydrofera Blue as the primary dressing backing absorptive dressings. 3. His wife is doing his dressings for him 4. I am looking at a total contact  cast although I am a bit concerned about the amount of drainage. We will keep a close eye on this his wife is known to take pictures. 5. Fortunately to date no evidence of infection. 6. The only thing I have not really explored with this man is the issue of plastic surgery. I am not sure how he would feel about this Electronic Signature(s) Signed: 11/12/2020 5:22:04 PM By: Linton Ham MD Entered By: Linton Ham on 11/12/2020 10:57:09 -------------------------------------------------------------------------------- SuperBill Details Patient Name: Date of Service: [name], [place] E. 11/12/2020 Medical  Record Number: [id number] Patient Account Number: [id number] Date of Birth/Sex: Treating RN: 1976/05/04 (45 y.o. [name], [name] Primary Care Provider: [name] Other Clinician: Referring Provider: Treating Provider/Extender: [name] in Treatment: 17 Diagnosis Coding ICD-10 Codes Code Description [phone] Pressure ulcer of other site, stage 3 M62.81 Muscle weakness (generalized) I10 Essential (primary) hypertension L97.512 Non-pressure chronic ulcer of other part of right foot with fat layer exposed L97.522 Non-pressure chronic ulcer of other 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 Facility Procedures CPT4 Code: [id number] Description: [phone] - WOUND CARE VISIT-LEV 5 EST PT Modifier: Quantity: 1 Physician Procedures : CPT4 Code Description Modifier [id number] - WC PHYS LEVEL 4 - EST PT ICD-10 Diagnosis Description L97.512 Non-pressure chronic ulcer of other part of right foot with fat layer exposed L97.522 Non-pressure chronic ulcer of other part of left foot  with fat layer exposed Quantity: 1 Electronic Signature(s) Signed: 11/12/2020 5:22:04 PM By: Linton Ham MD Signed: 11/13/2020 4:53:51 PM By: [name] Entered By: [name] on 11/12/2020 11:38:00

## 2020-11-13 NOTE — Progress Notes (Signed)
ILIAS, STCHARLES ([id number]) Visit Report for 11/12/2020 Arrival Information Details Patient Name: Date of Service: [name], [name] 11/12/2020 8:45 A M Medical Record Number: [id number] Patient Account Number: [id number] Date of Birth/Sex: Treating RN: [birth year] (45 y.o. [name] Primary Care [place]: [name] Other Clinician: Referring [name]: Treating [name]/Extender: [name] in Treatment: 48 Visit Information History Since Last Visit Added or deleted any medications: No Patient Arrived: Wheel Chair Any new allergies or adverse reactions: No Arrival Time: 08:49 Had a fall or experienced change in No Accompanied By: Wife activities of daily living that may affect Transfer Assistance: None risk of falls: Patient Identification Verified: Yes Signs or symptoms of abuse/neglect since last visito No Secondary Verification Process Completed: Yes Hospitalized since last visit: No Patient Requires Transmission-Based Precautions: No Implantable device outside of the clinic excluding No Patient Has Alerts: No cellular tissue based products placed in the center since last visit: Has Dressing in Place as Prescribed: Yes Has Footwear/Offloading in Place as Prescribed: Yes Left: Wedge Shoe Right: Wedge Shoe Pain Present Now: Yes Electronic Signature(s) Signed: 11/12/2020 5:35:17 PM By: Lorrin Jackson Entered By: Lorrin Jackson on 11/12/2020 08:54:44 -------------------------------------------------------------------------------- Clinic Level of Care Assessment Details Patient Name: Date of Service: [name] [name] 11/12/2020 8:45 A M Medical Record Number: [id number] Patient Account Number: [id number] Date of Birth/Sex: Treating RN: 03-21-1976 (45 y.o. [name], [name] Primary Care [place]ild[place] Broz: [name] Other Clinician: Referring [name]: Treating [name]/Extender: [name] in Treatment: 17 Clinic Level of Care  Assessment Items TOOL 4 Quantity Score X- 1 0 Use when only an EandM is performed on FOLLOW-UP visit ASSESSMENTS - Nursing Assessment / Reassessment X- 1 10 Reassessment of Co-morbidities (includes updates in patient status) X- 1 5 Reassessment of Adherence [name] Treatment Plan ASSESSMENTS - Wound and Skin A ssessment / Reassessment []  - 0 Simple Wound Assessment / Reassessment - one wound X- 2 5 Complex Wound Assessment / Reassessment - multiple wounds X- 1 10 Dermatologic / Skin Assessment (not related [name] wound area) ASSESSMENTS - Focused Assessment X- 2 5 Circumferential Edema Measurements - multi extremities X- 1 10 Nutritional Assessment / Counseling / Intervention []  - 0 Lower Extremity Assessment (monofilament, tuning fork, pulses) []  - 0 Peripheral Arterial Disease Assessment (using hand held doppler) ASSESSMENTS - Ostomy and/or Continence Assessment and Care []  - 0 Incontinence Assessment and Management []  - 0 Ostomy Care Assessment and Management (repouching, etc.) PROCESS - Coordination of Care []  - 0 Simple Patient / Family Education for ongoing care X- 1 20 Complex (extensive) Patient / Family Education for ongoing care X- 1 10 Staff obtains Programmer, systems, Records, T Results / Process Orders est []  - 0 Staff telephones HHA, Nursing Homes / Clarify orders / etc []  - 0 Routine Transfer [name] another Facility (non-emergent condition) []  - 0 Routine Hospital Admission (non-emergent condition) []  - 0 New Admissions / Biomedical engineer / Ordering NPWT Apligraf, etc. , []  - 0 Emergency Hospital Admission (emergent condition) []  - 0 Simple Discharge Coordination X- 1 15 Complex (extensive) Discharge Coordination PROCESS - Special Needs []  - 0 Pediatric / Minor Patient Management []  - 0 Isolation Patient Management []  - 0 Hearing / Language / Visual special needs []  - 0 Assessment of Community assistance (transportation, D/C planning, etc.) []  -  0 Additional assistance / Altered mentation []  - 0 Support Surface(s) Assessment (bed, cushion, seat, etc.) INTERVENTIONS - Wound Cleansing / Measurement []  - 0 Simple Wound  Cleansing - one wound X- 2 5 Complex Wound Cleansing - multiple wounds X- 1 5 Wound Imaging (photographs - any number of wounds) []  - 0 Wound Tracing (instead of photographs) []  - 0 Simple Wound Measurement - one wound X- 2 5 Complex Wound Measurement - multiple wounds INTERVENTIONS - Wound Dressings []  - 0 Small Wound Dressing one or multiple wounds X- 2 15 Medium Wound Dressing one or multiple wounds []  - 0 Large Wound Dressing one or multiple wounds []  - 0 Application of Medications - topical []  - 0 Application of Medications - injection INTERVENTIONS - Miscellaneous []  - 0 External ear exam []  - 0 Specimen Collection (cultures, biopsies, blood, body fluids, etc.) []  - 0 Specimen(s) / Culture(s) sent or taken [name] Lab for analysis []  - 0 Patient Transfer (multiple staff / Civil Service fast streamer / Similar devices) []  - 0 Simple Staple / Suture removal (25 or less) []  - 0 Complex Staple / Suture removal (26 or more) []  - 0 Hypo / Hyperglycemic Management (close monitor of Blood Glucose) []  - 0 Ankle / Brachial Index (ABI) - do not check if billed separately X- 1 5 Vital Signs Has the patient been seen at the hospital within the last three years: Yes Total Score: 160 Level Of Care: New/Established - Level 5 Electronic Signature(s) Signed: 11/13/2020 4:53:51 PM By: [name] Entered By: [name] on 11/12/2020 11:37:28 -------------------------------------------------------------------------------- Encounter Discharge Information Details Patient Name: Date of Service: [name], [name] E. 11/12/2020 8:45 A M Medical Record Number: [id number] Patient Account Number: [id number] Date of Birth/Sex: Treating RN: [birth year] (45 y.o. [name] Primary Care [name]: [name] Other  Clinician: Referring [name]: Treating [name]/Extender: [name] in Treatment: 17 Encounter Discharge Information Items Discharge Condition: Stable Ambulatory Status: Wheelchair Discharge Destination: Home Transportation: Private Auto Accompanied By: spouse Schedule Follow-up Appointment: Yes Clinical Summary of Care: Patient Declined Electronic Signature(s) Signed: 11/12/2020 5:18:58 PM By: Baruch Gouty RN, BSN Entered By: Baruch Gouty on 11/12/2020 10:30:56 -------------------------------------------------------------------------------- Lower Extremity Assessment Details Patient Name: Date of Service: [name], [place] 11/12/2020 8:45 A M Medical Record Number: [id number] Patient Account Number: [id number] Date of Birth/Sex: Treating RN: 1976-05-09 (45 y.o. [name] Primary Care Lorrine [place]: [name] Other Clinician: Referring Mikisha Roseland: Treating Mayjor Ager/Extender: [name] in Treatment: 17 Edema Assessment Assessed: [name]: Yes] [name]: Yes] Edema: [Left: Yes] [Right: Yes] Calf Left: Right: Point of Measurement: 29 cm From Medial Instep 43 cm 42.2 cm Ankle Left: Right: Point of Measurement: 9 cm From Medial Instep 24 cm 25 cm Vascular Assessment Pulses: Dorsalis Pedis Palpable: [Left:Yes] [Right:Yes] Electronic Signature(s) Signed: 11/12/2020 5:35:17 PM By: Lorrin Jackson Entered By: Lorrin Jackson on 11/12/2020 08:58:27 -------------------------------------------------------------------------------- Multi Wound Chart Details Patient Name: Date of Service: [name], [place] 11/12/2020 8:45 A M Medical Record Number: [id number] Patient Account Number: [id number] Date of Birth/Sex: Treating RN: [birth year] (45 y.o. [name] Primary Care Katara Griner: [name] Other Clinician: Referring Nil Xiong: Treating Shae Augello/Extender: [name] in Treatment: 17 Vital  Signs Height(in): 69 Pulse(bpm): 64 Weight(lbs): 280 Blood Pressure(mmHg): 166/78 Body Mass Index(BMI): 41 Temperature(F): 98.7 Respiratory Rate(breaths/min): 18 Photos: [1:No Photos Right Calcaneus] [2:No Photos Left Calcaneus] [N/A:N/A N/A] Wound Location: [1:Pressure Injury] [2:Pressure Injury] [N/A:N/A] Wounding Event: [1:Pressure Ulcer] [2:Pressure Ulcer] [N/A:N/A] Primary Etiology: [1:Asthma, Angina, Hypertension] [2:Asthma, Angina, Hypertension] [N/A:N/A] Comorbid History: [1:10/07/2019] [2:10/07/2019] [N/A:N/A] Date Acquired: [1:17] [2:17] [N/A:N/A] Weeks of Treatment: [1:Open] [2:Open] [N/A:N/A] Wound Status: [1:4.5x2x0.6] [2:5x2x0.3] [  N/A:N/A] Measurements L x W x D (cm) [1:7.069] [2:7.854] [N/A:N/A] A (cm) : rea [1:4.241] [2:2.356] [N/A:N/A] Volume (cm) : [1:77.60%] [2:71.00%] [N/A:N/A] % Reduction in A rea: [1:86.60%] [2:91.30%] [N/A:N/A] % Reduction in Volume: [2:1] Starting Position 1 (o'clock): [2:4] Ending Position 1 (o'clock): [2:0.4] Maximum Distance 1 (cm): [1:No] [2:Yes] [N/A:N/A] Undermining: [1:Category/Stage III] [2:Category/Stage III] [N/A:N/A] Classification: [1:Medium] [2:Medium] [N/A:N/A] Exudate A mount: [1:Serosanguineous] [2:Serosanguineous] [N/A:N/A] Exudate Type: [1:red, brown] [2:red, brown] [N/A:N/A] Exudate Color: [1:Thickened] [2:Thickened] [N/A:N/A] Wound Margin: [1:Medium (34-66%)] [2:Large (67-100%)] [N/A:N/A] Granulation A mount: [1:Pink, Pale] [2:Pink] [N/A:N/A] Granulation Quality: [1:Medium (34-66%)] [2:Small (1-33%)] [N/A:N/A] Necrotic A mount: [1:Fat Layer (Subcutaneous Tissue): Yes Fat Layer (Subcutaneous Tissue): Yes N/A] Exposed Structures: [1:Fascia: No Tendon: No Muscle: No Joint: No Bone: No Small (1-33%)] [2:Fascia: No Tendon: No Muscle: No Joint: No Bone: No Small (1-33%)] [N/A:N/A] Treatment Notes Wound #1 (Calcaneus) Wound Laterality: Right Cleanser Normal Saline Discharge Instruction: Cleanse the wound with Normal Saline  prior [name] applying a clean dressing using gauze sponges, not tissue or cotton balls. Wound Cleanser Discharge Instruction: Cleanse the wound with wound cleanser prior [name] applying a clean dressing using gauze sponges, not tissue or cotton balls. Peri-Wound Care Topical Primary Dressing Hydrofera Blue Classic Foam, 4x4 in Discharge Instruction: Moisten with saline prior [name] applying [name] wound bed Secondary Dressing Woven Gauze Sponge, Non-Sterile 4x4 in Discharge Instruction: Apply over primary dressing as directed. ABD Pad, 8x10 Discharge Instruction: Apply over primary dressing as directed. Zetuvit Plus 4x8 in Discharge Instruction: Apply over primary dressing as directed. Secured With Elastic Bandage 4 inch (ACE bandage) Discharge Instruction: Secure with ACE bandage as directed. Kerlix Roll Sterile, 4.5x3.1 (in/yd) Discharge Instruction: Secure with Kerlix as directed. Compression Wrap Compression Stockings Add-Ons Wound #2 (Calcaneus) Wound Laterality: Left Cleanser Normal Saline Discharge Instruction: Cleanse the wound with Normal Saline prior [name] applying a clean dressing using gauze sponges, not tissue or cotton balls. Wound Cleanser Discharge Instruction: Cleanse the wound with wound cleanser prior [name] applying a clean dressing using gauze sponges, not tissue or cotton balls. Peri-Wound Care Topical Primary Dressing Hydrofera Blue Classic Foam, 4x4 in Discharge Instruction: Moisten with saline prior [name] applying [name] wound bed Secondary Dressing Woven Gauze Sponge, Non-Sterile 4x4 in Discharge Instruction: Apply over primary dressing as directed. ABD Pad, 8x10 Discharge Instruction: Apply over primary dressing as directed. Zetuvit Plus 4x8 in Discharge Instruction: Apply over primary dressing as directed. Secured With Elastic Bandage 4 inch (ACE bandage) Discharge Instruction: Secure with ACE bandage as directed. Kerlix Roll Sterile, 4.5x3.1 (in/yd) Discharge Instruction:  Secure with Kerlix as directed. Compression Wrap Compression Stockings Add-Ons Electronic Signature(s) Signed: 11/12/2020 5:22:04 PM By: Linton Ham MD Signed: 11/13/2020 4:53:51 PM By: [name] Entered By: Linton Ham on 11/12/2020 10:51:24 -------------------------------------------------------------------------------- Multi-Disciplinary Care Plan Details Patient Name: Date of Service: [name], [place]. 11/12/2020 8:45 A M Medical Record Number: [id number] Patient Account Number: [id number] Date of Birth/Sex: Treating RN: 1976-07-04 (45 y.o. [name] Primary Care Izacc Demeyer: [name] Other Clinician: Referring Gerica Koble: Treating Abelino Tippin/Extender: [name] in Treatment: [place] reviewed with physician Active Inactive Abuse / Safety / Falls / Self Care Management Nursing Diagnoses: Potential for falls Goals: Patient/caregiver will verbalize/demonstrate measures taken [name] prevent injury and/or falls Date Initiated: 07/15/2020 Target Resolution Date: 12/11/2020 Goal Status: Active Interventions: Assess fall risk on admission and as needed Assess impairment of mobility on admission and as needed per policy Notes: Wound/Skin Impairment Nursing Diagnoses: Impaired tissue integrity Knowledge deficit  related [name] ulceration/compromised skin integrity Goals: Patient/caregiver will verbalize understanding of skin care regimen Date Initiated: 07/15/2020 Target Resolution Date: 12/04/2020 Goal Status: Active Ulcer/skin breakdown will have a volume reduction of 30% by week 4 Date Initiated: 07/15/2020 Date Inactivated: 08/20/2020 Target Resolution Date: 09/03/2020 Goal Status: Unmet Unmet Reason: no major changes. Interventions: Assess patient/caregiver ability [name] obtain necessary supplies Assess patient/caregiver ability [name] perform ulcer/skin care regimen upon admission and as needed Assess ulceration(s) every  visit Provide education on ulcer and skin care Treatment Activities: Skin care regimen initiated : 07/15/2020 Topical wound management initiated : 07/15/2020 Notes: Electronic Signature(s) Signed: 11/13/2020 4:53:51 PM By: [name] Entered By: [name] on 11/12/2020 08:58:42 -------------------------------------------------------------------------------- Pain Assessment Details Patient Name: Date of Service: [name], [place]. 11/12/2020 8:45 A M Medical Record Number: [id number] Patient Account Number: [id number] Date of Birth/Sex: Treating RN: [birth year] (45 y.o. [name] Primary Care Manessa Buley: [name] Other Clinician: Referring Breahna Boylen: Treating Colbert Curenton/Extender: [name] in Treatment: 17 Active Problems Location of Pain Severity and Description of Pain Patient Has Paino Yes Site Locations Pain Location: Pain in Ulcers With Dressing Change: Yes Rate the pain. Current Pain Level: 4 Character of Pain Describe the Pain: Aching, Tender, Throbbing Pain Management and Medication Current Pain Management: Medication: Yes Cold Application: No Rest: Yes Massage: No Activity: No T.MckenzieN.S.: No Heat Application: No Leg drop or elevation: No Is the Current Pain Management Adequate: Inadequate How does your wound impact your activities of daily livingo Sleep: No Bathing: No Appetite: No Relationship With Others: No Bladder Continence: No Emotions: No Bowel Continence: No Work: No Toileting: No Drive: No Dressing: No Hobbies: No Electronic Signature(s) Signed: 11/12/2020 5:35:17 PM By: Lorrin Jackson Entered By: Lorrin Jackson on 11/12/2020 08:55:58 -------------------------------------------------------------------------------- Patient/Caregiver Education Details Patient Name: Date of Service: [name], [name] [place] E. 4/7/2022andnbsp8:45 A M Medical Record Number: [id number] Patient Account Number: [id number] Date of  Birth/Gender: Treating RN: March 12, 1976 (45 y.o. [name] Primary Care Physician: [name] Other Clinician: Referring Physician: Treating Physician/Extender: [name] in Treatment: 71 Education Assessment Education Provided [name]: Patient Education Topics Provided Wound Debridement: Handouts: Wound Debridement Methods: Explain/Verbal Responses: State content correctly Electronic Signature(s) Signed: 11/13/2020 4:53:51 PM By: [name] Entered By: [name] on 11/12/2020 08:58:55 -------------------------------------------------------------------------------- Wound Assessment Details Patient Name: Date of Service: [name], [place] 11/12/2020 8:45 A M Medical Record Number: [id number] Patient Account Number: [id number] Date of Birth/Sex: Treating RN: 02-17-1976 (45 y.o. [name] Primary Care De [place]: [name] Other Clinician: Referring Ritter Helsley: Treating Mariadel Mruk/Extender: [name] in Treatment: 17 Wound Status Wound Number: 1 Primary Etiology: Pressure Ulcer Wound Location: Right Calcaneus Wound Status: Open Wounding Event: Pressure Injury Comorbid History: Asthma, Angina, Hypertension Date Acquired: 10/07/2019 Weeks Of Treatment: 17 Clustered Wound: No Photos Wound Measurements Length: (cm) 4.5 Width: (cm) 2 Depth: (cm) 0.6 Area: (cm) 7.069 Volume: (cm) 4.241 % Reduction in Area: 77.6% % Reduction in Volume: 86.6% Epithelialization: Small (1-33%) Tunneling: No Undermining: No Wound Description Classification: Category/Stage III Wound Margin: Thickened Exudate Amount: Medium Exudate Type: Serosanguineous Exudate Color: red, brown Foul Odor After Cleansing: No Slough/Fibrino Yes Wound Bed Granulation Amount: Medium (34-66%) Exposed Structure Granulation Quality: Pink, Pale Fascia Exposed: No Necrotic Amount: Medium (34-66%) Fat Layer (Subcutaneous Tissue) Exposed: Yes Necrotic  Quality: Adherent Slough Tendon Exposed: No Muscle Exposed: No Joint Exposed: No Bone Exposed: No Treatment Notes Wound #1 (Calcaneus) Wound Laterality: Right Cleanser Normal Saline  Discharge Instruction: Cleanse the wound with Normal Saline prior [name] applying a clean dressing using gauze sponges, not tissue or cotton balls. Wound Cleanser Discharge Instruction: Cleanse the wound with wound cleanser prior [name] applying a clean dressing using gauze sponges, not tissue or cotton balls. Peri-Wound Care Topical Primary Dressing Hydrofera Blue Classic Foam, 4x4 in Discharge Instruction: Moisten with saline prior [name] applying [name] wound bed Secondary Dressing Woven Gauze Sponge, Non-Sterile 4x4 in Discharge Instruction: Apply over primary dressing as directed. ABD Pad, 8x10 Discharge Instruction: Apply over primary dressing as directed. Zetuvit Plus 4x8 in Discharge Instruction: Apply over primary dressing as directed. Secured With Elastic Bandage 4 inch (ACE bandage) Discharge Instruction: Secure with ACE bandage as directed. Kerlix Roll Sterile, 4.5x3.1 (in/yd) Discharge Instruction: Secure with Kerlix as directed. Compression Wrap Compression Stockings Add-Ons Electronic Signature(s) Signed: 11/12/2020 4:58:23 PM By: Sandre Kitty Signed: 11/12/2020 5:35:17 PM By: Lorrin Jackson Entered By: Sandre Kitty on 11/12/2020 16:35:15 -------------------------------------------------------------------------------- Wound Assessment Details Patient Name: Date of Service: [name], [place]. 11/12/2020 8:45 A M Medical Record Number: [id number] Patient Account Number: [id number] Date of Birth/Sex: Treating RN: [birth year] (45 y.o. [name] Primary Care Marybell Robards: [name] Other Clinician: Referring Mahli Glahn: Treating Charlea Nardo/Extender: [name] in Treatment: 17 Wound Status Wound Number: 2 Primary Etiology: Pressure Ulcer Wound Location: Left  Calcaneus Wound Status: Open Wounding Event: Pressure Injury Comorbid History: Asthma, Angina, Hypertension Date Acquired: 10/07/2019 Weeks Of Treatment: 17 Clustered Wound: No Photos Wound Measurements Length: (cm) 5 Width: (cm) 2 Depth: (cm) 0.3 Area: (cm) 7.854 Volume: (cm) 2.356 % Reduction in Area: 71% % Reduction in Volume: 91.3% Epithelialization: Small (1-33%) Tunneling: No Undermining: Yes Starting Position (o'clock): 1 Ending Position (o'clock): 4 Maximum Distance: (cm) 0.4 Wound Description Classification: Category/Stage III Wound Margin: Thickened Exudate Amount: Medium Exudate Type: Serosanguineous Exudate Color: red, brown Foul Odor After Cleansing: No Slough/Fibrino Yes Wound Bed Granulation Amount: Large (67-100%) Exposed Structure Granulation Quality: Pink Fascia Exposed: No Necrotic Amount: Small (1-33%) Fat Layer (Subcutaneous Tissue) Exposed: Yes Tendon Exposed: No Muscle Exposed: No Joint Exposed: No Bone Exposed: No Treatment Notes Wound #2 (Calcaneus) Wound Laterality: Left Cleanser Normal Saline Discharge Instruction: Cleanse the wound with Normal Saline prior [name] applying a clean dressing using gauze sponges, not tissue or cotton balls. Wound Cleanser Discharge Instruction: Cleanse the wound with wound cleanser prior [name] applying a clean dressing using gauze sponges, not tissue or cotton balls. Peri-Wound Care Topical Primary Dressing Hydrofera Blue Classic Foam, 4x4 in Discharge Instruction: Moisten with saline prior [name] applying [name] wound bed Secondary Dressing Woven Gauze Sponge, Non-Sterile 4x4 in Discharge Instruction: Apply over primary dressing as directed. ABD Pad, 8x10 Discharge Instruction: Apply over primary dressing as directed. Zetuvit Plus 4x8 in Discharge Instruction: Apply over primary dressing as directed. Secured With Elastic Bandage 4 inch (ACE bandage) Discharge Instruction: Secure with ACE bandage as  directed. Kerlix Roll Sterile, 4.5x3.1 (in/yd) Discharge Instruction: Secure with Kerlix as directed. Compression Wrap Compression Stockings Add-Ons Electronic Signature(s) Signed: 11/12/2020 4:58:23 PM By: Sandre Kitty Signed: 11/12/2020 5:35:17 PM By: Lorrin Jackson Entered By: Sandre Kitty on 11/12/2020 16:35:34 -------------------------------------------------------------------------------- Vitals Details Patient Name: Date of Service: [name], [place] 11/12/2020 8:45 A M Medical Record Number: [id number] Patient Account Number: [id number] Date of Birth/Sex: Treating RN: 1976/07/14 (45 y.o. [name] Primary Care Viveca Beckstrom: [name] Other Clinician: Referring Kamaryn Grimley: Treating Evona Westra/Extender: [name] in Treatment: 17 Vital Signs Time Taken: 08:54 Temperature (F):  98.7 Height (in): 69 Pulse (bpm): 88 Weight (lbs): 280 Respiratory Rate (breaths/min): 18 Body Mass Index (BMI): 41.3 Blood Pressure (mmHg): 166/78 Reference Range: 80 - 120 mg / dl Electronic Signature(s) Signed: 11/12/2020 5:35:17 PM By: Lorrin Jackson Entered By: Lorrin Jackson on 11/12/2020 08:55:16

## 2020-11-20 ENCOUNTER — Other Ambulatory Visit: Payer: Self-pay

## 2020-11-20 ENCOUNTER — Encounter (HOSPITAL_BASED_OUTPATIENT_CLINIC_OR_DEPARTMENT_OTHER): Payer: BC Managed Care – PPO | Admitting: Internal Medicine

## 2020-11-20 DIAGNOSIS — L97522 Non-pressure chronic ulcer of other 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: Secondary | ICD-10-CM | POA: Diagnosis not present

## 2020-11-20 DIAGNOSIS — L97512 Non-pressure chronic ulcer of other part of right foot with fat layer exposed: Secondary | ICD-10-CM | POA: Diagnosis not present

## 2020-11-20 DIAGNOSIS — L89893 Pressure ulcer of other site, stage 3: Secondary | ICD-10-CM | POA: Diagnosis not present

## 2020-11-20 DIAGNOSIS — L97412 Non-pressure chronic ulcer of right heel and midfoot with fat layer exposed: Secondary | ICD-10-CM | POA: Diagnosis not present

## 2020-11-20 DIAGNOSIS — Z833 Family history of diabetes mellitus: Secondary | ICD-10-CM | POA: Diagnosis not present

## 2020-11-20 DIAGNOSIS — L97422 Non-pressure chronic ulcer of left heel and midfoot with fat layer exposed: Secondary | ICD-10-CM | POA: Diagnosis not present

## 2020-11-20 DIAGNOSIS — Z8616 Personal history of COVID-19: Secondary | ICD-10-CM | POA: Diagnosis not present

## 2020-11-20 DIAGNOSIS — Z8249 Family history of ischemic heart disease and other diseases of the circulatory system: Secondary | ICD-10-CM | POA: Diagnosis not present

## 2020-11-20 DIAGNOSIS — I10 Essential (primary) hypertension: Secondary | ICD-10-CM | POA: Diagnosis not present

## 2020-11-20 NOTE — Progress Notes (Addendum)
[name], [name] ([id number]) Visit Report for 11/20/2020 Arrival Information Details Patient Name: Date of Service: [name], [place] 11/20/2020 9:00 A M Medical Record Number: [id number] Patient Account Number: [id number] Date of Birth/Sex: Treating RN: 09-11-1975 (45 y.o. [name] Primary Care [name]: [name] Other Clinician: Referring Quenesha Douglass: Treating Yolonda Purtle/Extender: [name] in Treatment: 38 Visit Information History Since Last Visit Added or deleted any medications: No Patient Arrived: Wheel Chair Any new allergies or adverse reactions: No Arrival Time: 09:05 Had a fall or experienced change in No Accompanied By: spouse activities of daily living that may affect Transfer Assistance: None risk of falls: Patient Identification Verified: Yes Signs or symptoms of abuse/neglect since last No Secondary Verification Process Completed: Yes visito Patient Requires Transmission-Based Precautions: No Hospitalized since last visit: No Patient Has Alerts: No Implantable device outside of the clinic excluding No cellular tissue based products placed in the center since last visit: Has Dressing in Place as Prescribed: Yes Has Footwear/Offloading in Place as Prescribed: Yes Left: Other:globoped heel offloader Right: Other:globoped heel offloader Pain Present Now: Yes Electronic Signature(s) Signed: 11/25/2020 6:20:01 PM By: Baruch Gouty RN, BSN Entered By: Baruch Gouty on 11/20/2020 09:06:01 -------------------------------------------------------------------------------- Clinic Level of Care Assessment Details Patient Name: Date of Service: [name], [place]. 11/20/2020 9:00 A M Medical Record Number: [id number] Patient Account Number: [id number] Date of Birth/Sex: Treating RN: Sep 02, 1975 (45 y.o. [name] Primary Care Hydie Langan: [name] Other Clinician: Referring Alwyn Cordner: Treating Markiya Keefe/Extender: [name] in Treatment: 18 Clinic Level of Care Assessment Items TOOL 4 Quantity Score X- 1 0 Use when only an EandM is performed on FOLLOW-UP visit ASSESSMENTS - Nursing Assessment / Reassessment X- 1 10 Reassessment of Co-morbidities (includes updates in patient status) X- 1 5 Reassessment of Adherence to Treatment Plan ASSESSMENTS - Wound and Skin A ssessment / Reassessment []  - 0 Simple Wound Assessment / Reassessment - one wound X- 2 5 Complex Wound Assessment / Reassessment - multiple wounds []  - 0 Dermatologic / Skin Assessment (not related to wound area) ASSESSMENTS - Focused Assessment []  - 0 Circumferential Edema Measurements - multi extremities []  - 0 Nutritional Assessment / Counseling / Intervention []  - 0 Lower Extremity Assessment (monofilament, tuning fork, pulses) []  - 0 Peripheral Arterial Disease Assessment (using hand held doppler) ASSESSMENTS - Ostomy and/or Continence Assessment and Care []  - 0 Incontinence Assessment and Management []  - 0 Ostomy Care Assessment and Management (repouching, etc.) PROCESS - Coordination of Care []  - 0 Simple Patient / Family Education for ongoing care X- 1 20 Complex (extensive) Patient / Family Education for ongoing care X- 1 10 Staff obtains Programmer, systems, Records, T Results / Process Orders est []  - 0 Staff telephones HHA, Nursing Homes / Clarify orders / etc []  - 0 Routine Transfer to another Facility (non-emergent condition) []  - 0 Routine Hospital Admission (non-emergent condition) []  - 0 New Admissions / Biomedical engineer / Ordering NPWT Apligraf, etc. , []  - 0 Emergency Hospital Admission (emergent condition) []  - 0 Simple Discharge Coordination []  - 0 Complex (extensive) Discharge Coordination PROCESS - Special Needs []  - 0 Pediatric / Minor Patient Management []  - 0 Isolation Patient Management []  - 0 Hearing / Language / Visual special needs []  - 0 Assessment of Community  assistance (transportation, D/C planning, etc.) []  - 0 Additional assistance / Altered mentation []  - 0 Support Surface(s) Assessment (bed, cushion, seat, etc.) INTERVENTIONS - Wound Cleansing / Measurement []  -  0 Simple Wound Cleansing - one wound X- 2 5 Complex Wound Cleansing - multiple wounds X- 1 5 Wound Imaging (photographs - any number of wounds) []  - 0 Wound Tracing (instead of photographs) []  - 0 Simple Wound Measurement - one wound X- 2 5 Complex Wound Measurement - multiple wounds INTERVENTIONS - Wound Dressings []  - 0 Small Wound Dressing one or multiple wounds X- 2 15 Medium Wound Dressing one or multiple wounds []  - 0 Large Wound Dressing one or multiple wounds X- 1 5 Application of Medications - topical []  - 0 Application of Medications - injection INTERVENTIONS - Miscellaneous []  - 0 External ear exam []  - 0 Specimen Collection (cultures, biopsies, blood, body fluids, etc.) []  - 0 Specimen(s) / Culture(s) sent or taken to Lab for analysis []  - 0 Patient Transfer (multiple staff / Civil Service fast streamer / Similar devices) []  - 0 Simple Staple / Suture removal (25 or less) []  - 0 Complex Staple / Suture removal (26 or more) []  - 0 Hypo / Hyperglycemic Management (close monitor of Blood Glucose) []  - 0 Ankle / Brachial Index (ABI) - do not check if billed separately X- 1 5 Vital Signs Has the patient been seen at the hospital within the last three years: Yes Total Score: 120 Level Of Care: New/Established - Level 4 Electronic Signature(s) Signed: 11/20/2020 5:21:53 PM By: Lorrin Jackson Entered By: Lorrin Jackson on 11/20/2020 09:39:43 -------------------------------------------------------------------------------- Encounter Discharge Information Details Patient Name: Date of Service: [name], [name] 11/20/2020 9:00 A M Medical Record Number: [id number] Patient Account Number: [id number] Date of Birth/Sex: Treating RN: 10-22-1975 (45 y.o. [name] Primary Care Furious Chiarelli: [name] Other Clinician: Referring Cane Dubray: Treating Barnet [name]/Extender: [name] in Treatment: 18 Encounter Discharge Information Items Discharge Condition: Stable Ambulatory Status: Wheelchair Discharge Destination: Home Transportation: Private Auto Accompanied By: wife Schedule Follow-up Appointment: Yes Clinical Summary of Care: Electronic Signature(s) Signed: 11/20/2020 5:54:45 PM By: [name] Entered By: [name] on 11/20/2020 11:08:43 -------------------------------------------------------------------------------- Lower Extremity Assessment Details Patient Name: Date of Service: [name], [place] 11/20/2020 9:00 A M Medical Record Number: [id number] Patient Account Number: [id number] Date of Birth/Sex: Treating RN: 07-19-1976 (45 y.o. [name] Primary Care Azalie Harbeck: [name] Other Clinician: Referring Nakia Koble: Treating Bryna Razavi/Extender: [name] in Treatment: 18 Edema Assessment Assessed: [name]: No] [name]: No] Edema: [Left: Yes] [Right: Yes] Calf Left: Right: Point of Measurement: 29 cm From Medial Instep 45 cm 43 cm Ankle Left: Right: Point of Measurement: 9 cm From Medial Instep 25 cm 24 cm Vascular Assessment Pulses: Dorsalis Pedis Palpable: [Left:Yes] [Right:Yes] Electronic Signature(s) Signed: 11/25/2020 6:20:01 PM By: Baruch Gouty RN, BSN Entered By: Baruch Gouty on 11/20/2020 09:10:30 -------------------------------------------------------------------------------- Multi Wound Chart Details Patient Name: Date of Service: [name], [place] 11/20/2020 9:00 A M Medical Record Number: [id number] Patient Account Number: [id number] Date of Birth/Sex: Treating RN: 1976/04/07 (45 y.o. [name] Primary Care Kennedy Bohanon: [name] Other Clinician: Referring Synda Bagent: Treating Nocole Zammit/Extender: [name] in  Treatment: 18 Vital Signs Height(in): 21 Pulse(bpm): 10 Weight(lbs): 280 Blood Pressure(mmHg): 144/91 Body Mass Index(BMI): 41 Temperature(F): 98.1 Respiratory Rate(breaths/min): 18 Photos: [1:No Photos Right Calcaneus] [2:No Photos Left Calcaneus] [N/A:N/A N/A] Wound Location: [1:Pressure Injury] [2:Pressure Injury] [N/A:N/A] Wounding Event: [1:Pressure Ulcer] [2:Pressure Ulcer] [N/A:N/A] Primary Etiology: [1:Asthma, Angina, Hypertension] [2:Asthma, Angina, Hypertension] [N/A:N/A] Comorbid History: [1:10/07/2019] [2:10/07/2019] [N/A:N/A] Date Acquired: [1:18] [2:18] [N/A:N/A] Weeks of Treatment: [1:Open] [2:Open] [N/A:N/A] Wound Status: [1:5.5x1.5x0.4] [  2:4.9x1.6x0.4] [N/A:N/A] Measurements L x W x D (cm) [1:6.48] [2:6.158] [N/A:N/A] A (cm) : rea [1:2.592] [2:2.463] [N/A:N/A] Volume (cm) : [1:79.50%] [2:77.30%] [N/A:N/A] % Reduction in A rea: [1:91.80%] [2:90.90%] [N/A:N/A] % Reduction in Volume: [1:Category/Stage III] [2:Category/Stage III] [N/A:N/A] Classification: [1:Medium] [2:Medium] [N/A:N/A] Exudate A mount: [1:Serosanguineous] [2:Serosanguineous] [N/A:N/A] Exudate Type: [1:red, brown] [2:red, brown] [N/A:N/A] Exudate Color: [1:Thickened] [2:Thickened] [N/A:N/A] Wound Margin: [1:Medium (34-66%)] [2:Medium (34-66%)] [N/A:N/A] Granulation A mount: [1:Pink] [2:Pink] [N/A:N/A] Granulation Quality: [1:Medium (34-66%)] [2:Medium (34-66%)] [N/A:N/A] Necrotic A mount: [1:Fat Layer (Subcutaneous Tissue): Yes Fat Layer (Subcutaneous Tissue): Yes N/A] Exposed Structures: [1:Fascia: No Tendon: No Muscle: No Joint: No Bone: No Small (1-33%)] [2:Fascia: No Tendon: No Muscle: No Joint: No Bone: No Small (1-33%)] [N/A:N/A] Treatment Notes Electronic Signature(s) Signed: 11/20/2020 4:57:27 PM By: Linton Ham MD Signed: 11/20/2020 5:21:53 PM By: Lorrin Jackson Entered By: Linton Ham on 11/20/2020  09:45:54 -------------------------------------------------------------------------------- Multi-Disciplinary Care Plan Details Patient Name: Date of Service: [name], [name] 11/20/2020 9:00 A M Medical Record Number: [id number] Patient Account Number: [id number] Date of Birth/Sex: Treating RN: 1975-12-10 (45 y.o. [name] Primary Care Elvira Langston: [name] Other Clinician: Referring Raevin Wierenga: Treating Kenita Bines/Extender: [name] in Treatment: 18 Multidisciplinary Care Plan reviewed with physician Active Inactive Abuse / Safety / Falls / Self Care Management Nursing Diagnoses: Potential for falls Goals: Patient/caregiver will verbalize/demonstrate measures taken to prevent injury and/or falls Date Initiated: 07/15/2020 Target Resolution Date: 12/11/2020 Goal Status: Active Interventions: Assess fall risk on admission and as needed Assess impairment of mobility on admission and as needed per policy Notes: Wound/Skin Impairment Nursing Diagnoses: Impaired tissue integrity Knowledge deficit related to ulceration/compromised skin integrity Goals: Patient/caregiver will verbalize understanding of skin care regimen Date Initiated: 07/15/2020 Target Resolution Date: 12/04/2020 Goal Status: Active Ulcer/skin breakdown will have a volume reduction of 30% by week 4 Date Initiated: 07/15/2020 Date Inactivated: 08/20/2020 Target Resolution Date: 09/03/2020 Goal Status: Unmet Unmet Reason: no major changes. Interventions: Assess patient/caregiver ability to obtain necessary supplies Assess patient/caregiver ability to perform ulcer/skin care regimen upon admission and as needed Assess ulceration(s) every visit Provide education on ulcer and skin care Treatment Activities: Skin care regimen initiated : 07/15/2020 Topical wound management initiated : 07/15/2020 Notes: Electronic Signature(s) Signed: 11/20/2020 8:48:14 AM By: Lorrin Jackson Entered By:  Lorrin Jackson on 11/20/2020 08:48:14 -------------------------------------------------------------------------------- Pain Assessment Details Patient Name: Date of Service: [name], [place] 11/20/2020 9:00 A M Medical Record Number: [id number] Patient Account Number: [id number] Date of Birth/Sex: Treating RN: 04-11-1976 (45 y.o. [name] Primary Care Idali Lafever: [name] Other Clinician: Referring Shamira Toutant: Treating Annalyn Blecher/Extender: [name] in Treatment: 18 Active Problems Location of Pain Severity and Description of Pain Patient Has Paino Yes Site Locations Pain Location: Pain in Ulcers With Dressing Change: Yes Duration of the Pain. Constant / Intermittento Intermittent Rate the pain. Current Pain Level: 3 Worst Pain Level: 6 Least Pain Level: 1 Character of Pain Describe the Pain: Aching Pain Management and Medication Current Pain Management: Medication: Yes Is the Current Pain Management Adequate: Adequate How does your wound impact your activities of daily livingo Sleep: No Bathing: No Appetite: No Relationship With Others: No Bladder Continence: No Emotions: No Bowel Continence: No Work: No Toileting: No Drive: No Dressing: No Hobbies: No Engineer, maintenance) Signed: 11/25/2020 6:20:01 PM By: Baruch Gouty RN, BSN Entered By: Baruch Gouty on 11/20/2020 09:07:14 -------------------------------------------------------------------------------- Patient/Caregiver Education Details Patient Name: Date of Service: [name], [name] 4/15/2022andnbsp9:00 A M  Medical Record Number: [id number] Patient Account Number: [id number] Date of Birth/Gender: Treating RN: [birth year] (45 y.o. [name] Primary Care Physician: [name] Other Clinician: Referring Physician: Treating Physician/Extender: [name] in Treatment: 18 Education Assessment Education Provided  To: Patient Education Topics Provided Wound/Skin Impairment: Methods: Explain/Verbal, Printed Responses: State content correctly Electronic Signature(s) Signed: 11/20/2020 5:21:53 PM By: Lorrin Jackson Entered By: Lorrin Jackson on 11/20/2020 09:04:03 -------------------------------------------------------------------------------- Wound Assessment Details Patient Name: Date of Service: [name], [place] 11/20/2020 9:00 A M Medical Record Number: [id number] Patient Account Number: [id number] Date of Birth/Sex: Treating RN: 20-Jan-1976 (45 y.o. [name] Primary Care Clancey Welton: [name] Other Clinician: Referring Aakash Hollomon: Treating Baneen Wieseler/Extender: [name] in Treatment: 18 Wound Status Wound Number: 1 Primary Etiology: Pressure Ulcer Wound Location: Right Calcaneus Wound Status: Open Wounding Event: Pressure Injury Comorbid History: Asthma, Angina, Hypertension Date Acquired: 10/07/2019 Weeks Of Treatment: 18 Clustered Wound: No Photos Wound Measurements Length: (cm) 5.5 Width: (cm) 1.5 Depth: (cm) 0.4 Area: (cm) 6.48 Volume: (cm) 2.592 % Reduction in Area: 79.5% % Reduction in Volume: 91.8% Epithelialization: Small (1-33%) Tunneling: No Undermining: No Wound Description Classification: Category/Stage III Wound Margin: Thickened Exudate Amount: Medium Exudate Type: Serosanguineous Exudate Color: red, brown Foul Odor After Cleansing: No Slough/Fibrino Yes Wound Bed Granulation Amount: Medium (34-66%) Exposed Structure Granulation Quality: Pink Fascia Exposed: No Necrotic Amount: Medium (34-66%) Fat Layer (Subcutaneous Tissue) Exposed: Yes Necrotic Quality: Adherent Slough Tendon Exposed: No Muscle Exposed: No Joint Exposed: No Bone Exposed: No Treatment Notes Wound #1 (Calcaneus) Wound Laterality: Right Cleanser Normal Saline Discharge Instruction: Cleanse the wound with Normal Saline prior to applying a clean  dressing using gauze sponges, not tissue or cotton balls. Wound Cleanser Discharge Instruction: Cleanse the wound with wound cleanser prior to applying a clean dressing using gauze sponges, not tissue or cotton balls. Peri-Wound Care Topical Primary Dressing Hydrofera Blue Classic Foam, 4x4 in Discharge Instruction: Moisten with saline prior to applying to wound bed Secondary Dressing Woven Gauze Sponge, Non-Sterile 4x4 in Discharge Instruction: Apply over primary dressing as directed. ABD Pad, 8x10 Discharge Instruction: Apply over primary dressing as directed. Zetuvit Plus 4x8 in Discharge Instruction: Apply over primary dressing as directed. Secured With Elastic Bandage 4 inch (ACE bandage) Discharge Instruction: Secure with ACE bandage as directed. Kerlix Roll Sterile, 4.5x3.1 (in/yd) Discharge Instruction: Secure with Kerlix as directed. Compression Wrap Compression Stockings Add-Ons Electronic Signature(s) Signed: 11/24/2020 8:07:32 AM By: Sandre Kitty Signed: 11/25/2020 6:20:01 PM By: Baruch Gouty RN, BSN Entered By: Sandre Kitty on 11/20/2020 16:49:07 -------------------------------------------------------------------------------- Wound Assessment Details Patient Name: Date of Service: [name], [place] 11/20/2020 9:00 A M Medical Record Number: [id number] Patient Account Number: [id number] Date of Birth/Sex: Treating RN: 28-May-1976 (45 y.o. [name] Primary Care Nathaneil Feagans: [name] Other Clinician: Referring Latora Quarry: Treating Doraine Schexnider/Extender: [name] in Treatment: 18 Wound Status Wound Number: 2 Primary Etiology: Pressure Ulcer Wound Location: Left Calcaneus Wound Status: Open Wounding Event: Pressure Injury Comorbid History: Asthma, Angina, Hypertension Date Acquired: 10/07/2019 Weeks Of Treatment: 18 Clustered Wound: No Photos Wound Measurements Length: (cm) 4.9 Width: (cm) 1.6 Depth: (cm) 0.4 Area:  (cm) 6.158 Volume: (cm) 2.463 % Reduction in Area: 77.3% % Reduction in Volume: 90.9% Epithelialization: Small (1-33%) Tunneling: No Undermining: No Wound Description Classification: Category/Stage III Wound Margin: Thickened Exudate Amount: Medium Exudate Type: Serosanguineous Exudate Color: red, brown Foul Odor After Cleansing: No Slough/Fibrino Yes Wound Bed Granulation Amount: Medium (34-66%) Exposed  Structure Granulation Quality: Pink Fascia Exposed: No Necrotic Amount: Medium (34-66%) Fat Layer (Subcutaneous Tissue) Exposed: Yes Necrotic Quality: Adherent Slough Tendon Exposed: No Muscle Exposed: No Joint Exposed: No Bone Exposed: No Treatment Notes Wound #2 (Calcaneus) Wound Laterality: Left Cleanser Normal Saline Discharge Instruction: Cleanse the wound with Normal Saline prior to applying a clean dressing using gauze sponges, not tissue or cotton balls. Wound Cleanser Discharge Instruction: Cleanse the wound with wound cleanser prior to applying a clean dressing using gauze sponges, not tissue or cotton balls. Peri-Wound Care Topical Primary Dressing Hydrofera Blue Classic Foam, 4x4 in Discharge Instruction: Moisten with saline prior to applying to wound bed Secondary Dressing Woven Gauze Sponge, Non-Sterile 4x4 in Discharge Instruction: Apply over primary dressing as directed. ABD Pad, 8x10 Discharge Instruction: Apply over primary dressing as directed. Zetuvit Plus 4x8 in Discharge Instruction: Apply over primary dressing as directed. Secured With Elastic Bandage 4 inch (ACE bandage) Discharge Instruction: Secure with ACE bandage as directed. Kerlix Roll Sterile, 4.5x3.1 (in/yd) Discharge Instruction: Secure with Kerlix as directed. Compression Wrap Compression Stockings Add-Ons Electronic Signature(s) Signed: 11/24/2020 8:07:32 AM By: Sandre Kitty Signed: 11/25/2020 6:20:01 PM By: Baruch Gouty RN, BSN Entered By: Sandre Kitty on  11/20/2020 16:49:31 -------------------------------------------------------------------------------- Vitals Details Patient Name: Date of Service: [name], [place] 11/20/2020 9:00 A M Medical Record Number: [id number] Patient Account Number: [id number] Date of Birth/Sex: Treating RN: 03/10/1976 (45 y.o. [name] Primary Care Jaleeya Mcnelly: [name] Other Clinician: Referring Marshal Schrecengost: Treating Romero Letizia/Extender: [name] in Treatment: 18 Vital Signs Time Taken: 09:06 Temperature (F): 98.1 Height (in): 69 Pulse (bpm): 79 Weight (lbs): 280 Respiratory Rate (breaths/min): 18 Body Mass Index (BMI): 41.3 Blood Pressure (mmHg): 144/91 Reference Range: 80 - 120 mg / dl Electronic Signature(s) Signed: 11/25/2020 6:20:01 PM By: Baruch Gouty RN, BSN Entered By: Baruch Gouty on 11/20/2020 09:06:25

## 2020-11-20 NOTE — Progress Notes (Signed)
[name], [name] ([id number]) Visit Report for 11/20/2020 HPI Details Patient Name: Date of Service: [name], [place] 11/20/2020 9:00 A M Medical Record Number: [id number] Patient Account Number: [id number] Date of Birth/Sex: Treating RN: 1975-11-01 (45 y.o. [name] Primary Care Provider: [name] Other Clinician: Referring Provider: Treating Provider/Extender: [name] in Treatment: 18 History of Present Illness HPI Description: 07/15/2020 upon evaluation today patient presents for initial inspection here in our clinic concerning issues he has been having with the bottoms of his feet bilaterally. He states these actually occurred as wounds when he was hospitalized for 5 months secondary to Covid. He was apparently with tilting bed where he was in an upright position quite frequently and apparently this occurred in some way shape or form during that time. Fortunately there is no sign of active infection at this time. No fevers, chills, nausea, vomiting, or diarrhea. With that being said he still has substantial wounds on the plantar aspects of his feet Theragen require quite a bit of work to get these to heal. He has been using Santyl currently though that is been problematic both in receiving the medication as well as actually paid for it as it is become quite expensive. Prior to the experience with Covid the patient really did not have any major medical problems other than hypertension he does have some mild generalized weakness following the Covid experience. 07/22/2020 on evaluation today patient appears to be doing okay in regard to his foot ulcers I feel like the wound beds are showing signs of better improvement that I do believe the Iodoflex is helping in this regard. With that being said he does have a lot of drainage currently and this is somewhat blue/green in nature which is consistent with Pseudomonas. I do think a culture today would be  appropriate for [place] to evaluate and see if that is indeed the case I would likely start him on antibiotic orally as well he is not allergic to Cipro knows of no issues he has had in the past 12/21; patient was admitted to the clinic earlier this month with bilateral presumed pressure ulcers on the bottom of his feet apparently related to excessive pressure from a tilt table arrangement in the intensive care unit. Patient relates this to being on ECMO but I am not really sure that is exactly related to that. I must say I have never seen anything like this. He has fairly extensive full-thickness wounds extending from his heel towards his midfoot mostly centered laterally. There is already been some healing distally. He does not appear to have an arterial issue. He has been using gentamicin to the wound surfaces with Iodoflex to help with ongoing debridement 1/6; this is a patient with pressure ulcers on the bottom of his feet related to excessive pressure from a standing position in the intensive care unit. He is complaining of a lot of pain in the right heel. He is not a diabetic. 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 We have been using Iodoflex to help prepare the surfaces of both wounds for an advanced treatment product. He is nonambulatory spending most of his time in a wheelchair I have asked him not to propel the wheelchair with his heels 1/13; in general his wounds look better not much surface area change we have been using Iodoflex as of last week. I did an x-ray of the right heel as the patient was complaining of pain. I  had some thoughts about a stress fracture perhaps Achilles tendon problems however what it showed was erosive changes along the inferior aspect of the calcaneus he now has a MRI booked for 1/20. 1/20; in general his wounds continue to be better. Some improvement in the large narrow areas proximally in his foot. He is still complaining of pain in the  right heel and tenderness in certain areas of this wound. His MRI is tonight. I am not just looking for osteomyelitis that was brought up on the x-ray I am wondering about stress fractures, tendon ruptures etc. He has no such findings on the left. Also noteworthy is that the patient had critical illness neuropathy and some of the discomfort may be actual improvement in nerve function I am just not sure. These wounds were initially in the setting of severe critical illness related to COVID-19. He was put in a standing position. He may have also been on pressors at the point contributing to tissue ischemia. By his description at some point these wounds were grossly necrotic extending proximally up into the Achilles part of his heel. I do not know that I have ever really seen pictures of them like this although they may exist in epic We have ordered Tri layer Oasis. I am trying to stimulate some granulation in these areas. This is of course assuming the MRI is negative for infection 1/27; since the patient was last here he saw Dr. Juleen China of infectious disease. He is planned for vancomycin and ceftriaxone. Prior operative culture grew MSSA. Also ordered baseline lab work. He also ordered arterial studies although the ABIs in our clinic were normal as well as his clinical exam these were normal I do not think he needs to see vascular surgery. His ABIs at the PTA were 1.22 in the right triphasic waveforms with a normal TBI of 1.15 on the left ABI of 1.22 with triphasic waveforms and a normal TBI of 1.08. Finally he saw Dr. Amalia Hailey who will follow him in for 2 months. At this point I do not think he felt that he needed a procedure on the right calcaneal bone. Dr. Juleen China is elected for broad-spectrum antibiotic The patient is still having pain in the right heel. He walks with a walker 2/3; wounds are generally smaller. He is tolerating his IV antibiotics. I believe this is vancomycin and ceftriaxone. We are  still waiting for Oasis burn in terms of his out-of-pocket max which he should be meeting soon given the IV antibiotics, MRIs etc. I have asked him to check in on this. We are using silver collagen in the meantime the wounds look better 2/10; tolerating IV vancomycin and Rocephin. We are waiting to apply for Oasis. Although I am not really sure where he is in his out-of-pocket max. 2/17 started the first application of Oasis trilayer. Still on antibiotics. The wounds have generally look better. The area on the left has a little more surface slough requiring debridement 6/01; second application of Oasis trilayer. The wound surface granulation is generally look better. The area on the left with undermining laterally I think is come in a bit. 10/08/2020 upon evaluation today patient is here today for [employer] application #3. Fortunately he seems to be doing extremely well with regard to this and we are seeing a lot of new epithelial growth which is great news. Fortunately there is no signs of active infection at this time. 10/16/2020 upon evaluation today patient appears to be doing well with regard  to his foot ulcers. Do believe the Oasis has been of benefit for him. I do not see any signs of infection right now which is great news and I think that he has a lot of new epithelial growth which is great to see as well. The patient is very pleased to hear all of this. I do think we can proceed with the Oasis trilayer #4 today. 3/18; not as much improvement in these areas on his heels that I was hoping. I did reapply trilateral Oasis today the tissue looks healthier but not as much fill in as I was hoping. 3/25; better looking today I think this is come in a bit the tissue looks healthier. Triple layer Oasis reapplied #6 4/1; somewhat better looking definitely better looking surface not as much change in surface area as I was hoping. He may be spending more time [place] on days then he needs to although  he does have heel offloading boots. Triple layer Oasis reapplied #7 4/7; unfortunately apparently [facility] will not approve any further Oasis which is unfortunate since the patient did respond nicely both in terms of the condition of the wound bed as well as surface area. There is still some drainage coming from the wound but not a lot there does not appear to be any infection 4/15; we have been using Hydrofera Blue. He continues to have nice rims of epithelialization on the right greater than the left. The left the epithelialization is coming from the tip of his heel. There is moderate drainage. In this that concerns me about a total contact cast. There is no evidence of infection Electronic Signature(s) Signed: 11/20/2020 4:57:27 PM By: Linton Ham MD Entered By: Linton Ham on 11/20/2020 09:46:40 -------------------------------------------------------------------------------- Physical Exam Details Patient Name: Date of Service: [name], [place] 11/20/2020 9:00 A M Medical Record Number: [id number] Patient Account Number: [id number] Date of Birth/Sex: Treating RN: June 10, 1976 (45 y.o. [name] Primary Care Provider: [name] Other Clinician: Referring Provider: Treating Provider/Extender: [name] in Treatment: 66 Constitutional Patient is hypertensive.. Pulse regular and within target range for patient.Marland Kitchen Respirations regular, non-labored and within target range.. Temperature is normal and within the target range for the patient.Marland Kitchen Appears in no distress. Notes Wound exam; both wounds surfaces look satisfactory. There is rounded edges around both wound areas. This would be contained skin and subcutaneous tissue. The epithelialization is going over the surface of the wound which is over bone. Nevertheless new skin of any degree over this is a good thing to see. Electronic Signature(s) Signed: 11/20/2020 4:57:27 PM By: Linton Ham  MD Entered By: Linton Ham on 11/20/2020 09:47:54 -------------------------------------------------------------------------------- Physician Orders Details Patient Name: Date of Service: [name], [place] 11/20/2020 9:00 A M Medical Record Number: [id number] Patient Account Number: [id number] Date of Birth/Sex: Treating RN: 1976-04-18 (45 y.o. [name] Primary Care Provider: [name] Other Clinician: Referring Provider: Treating Provider/Extender: [name] in Treatment: 18 Verbal / Phone Orders: No Diagnosis Coding ICD-10 Coding Code Description [id number] Pressure ulcer of other site, stage 3 M62.81 Muscle weakness (generalized) I10 Essential (primary) hypertension L97.512 Non-pressure chronic ulcer of other part of right foot with fat layer exposed L97.522 Non-pressure chronic ulcer of other 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 Follow-up Appointments Return Appointment in 2 weeks. Bathing/ Shower/ Hygiene May shower with protection but do not get wound dressing(s) wet. Edema Control - Lymphedema / SCD / Other Bilateral  Lower Extremities Elevate legs to the level of the heart or above for 30 minutes daily and/or when sitting, a frequency of: Avoid standing for long periods of time. Off-Loading Wedge shoe to: - Gloped to bilateral feet Other: - keep pressure off of the bottom of your feet Wound Treatment Wound #1 - Calcaneus Wound Laterality: Right Cleanser: Normal Saline (Generic) Every Other Day/15 Days Discharge Instructions: Cleanse the wound with Normal Saline prior to applying a clean dressing using gauze sponges, not tissue or cotton balls. Cleanser: Wound Cleanser Every Other Day/15 Days Discharge Instructions: Cleanse the wound with wound cleanser prior to applying a clean dressing using gauze sponges, not tissue or cotton balls. Prim Dressing: Hydrofera Blue Classic Foam, 4x4 in (Dispense As Written) Every Other Day/15  Days ary Discharge Instructions: Moisten with saline prior to applying to wound bed Secondary Dressing: Woven Gauze Sponge, Non-Sterile 4x4 in (Generic) Every Other Day/15 Days Discharge Instructions: Apply over primary dressing as directed. Secondary Dressing: ABD Pad, 8x10 (Generic) Every Other Day/15 Days Discharge Instructions: Apply over primary dressing as directed. Secondary Dressing: Zetuvit Plus 4x8 in (Generic) Every Other Day/15 Days Discharge Instructions: Apply over primary dressing as directed. Secured With: Elastic Bandage 4 inch (ACE bandage) Every Other Day/15 Days Discharge Instructions: Secure with ACE bandage as directed. Secured With: [employer], 4.5x3.1 (in/yd) (Generic) Every Other Day/15 Days Discharge Instructions: Secure with Kerlix as directed. Wound #2 - Calcaneus Wound Laterality: Left Cleanser: Normal Saline (Generic) Every Other Day/15 Days Discharge Instructions: Cleanse the wound with Normal Saline prior to applying a clean dressing using gauze sponges, not tissue or cotton balls. Cleanser: Wound Cleanser Every Other Day/15 Days Discharge Instructions: Cleanse the wound with wound cleanser prior to applying a clean dressing using gauze sponges, not tissue or cotton balls. Prim Dressing: Hydrofera Blue Classic Foam, 4x4 in (Dispense As Written) Every Other Day/15 Days ary Discharge Instructions: Moisten with saline prior to applying to wound bed Secondary Dressing: Woven Gauze Sponge, Non-Sterile 4x4 in (Generic) Every Other Day/15 Days Discharge Instructions: Apply over primary dressing as directed. Secondary Dressing: ABD Pad, 8x10 (Generic) Every Other Day/15 Days Discharge Instructions: Apply over primary dressing as directed. Secondary Dressing: Zetuvit Plus 4x8 in (Generic) Every Other Day/15 Days Discharge Instructions: Apply over primary dressing as directed. Secured With: Elastic Bandage 4 inch (ACE bandage) Every Other Day/15  Days Discharge Instructions: Secure with ACE bandage as directed. Secured With: [employer], 4.5x3.1 (in/yd) (Generic) Every Other Day/15 Days Discharge Instructions: Secure with Kerlix as directed. Electronic Signature(s) Signed: 11/20/2020 4:57:27 PM By: Linton Ham MD Signed: 11/20/2020 5:21:53 PM By: Lorrin Jackson Entered By: Lorrin Jackson on 11/20/2020 09:43:49 -------------------------------------------------------------------------------- Problem List Details Patient Name: Date of Service: [name], [place] 11/20/2020 9:00 A M Medical Record Number: [id number] Patient Account Number: [id number] Date of Birth/Sex: Treating RN: 06-06-1976 (45 y.o. [name] Primary Care Provider: [name] Other Clinician: Referring Provider: Treating Provider/Extender: [name] in Treatment: 18 Active Problems ICD-10 Encounter Code Description Active Date MDM Diagnosis [phone] Pressure ulcer of other site, stage 3 07/15/2020 No Yes M62.81 Muscle weakness (generalized) 07/15/2020 No Yes I10 Essential (primary) hypertension 07/15/2020 No Yes L97.512 Non-pressure chronic ulcer of other part of right foot with fat layer exposed 09/03/2020 No Yes L97.522 Non-pressure chronic ulcer of other 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 09/03/2020 No Yes Inactive Problems ICD-10 Code Description Active Date Inactive Date M86.171 Other acute osteomyelitis, right ankle and foot 09/03/2020  09/03/2020 Resolved Problems Electronic Signature(s) Signed: 11/20/2020 4:57:27 PM By: Linton Ham MD Previous Signature: 11/20/2020 9:29:01 AM Version By: Lorrin Jackson Previous Signature: 11/20/2020 8:47:41 AM Version By: Lorrin Jackson Entered By: Linton Ham on 11/20/2020 09:45:48 -------------------------------------------------------------------------------- Progress Note Details Patient Name: Date of Service: [name], [place] 11/20/2020 9:00 A M Medical  Record Number: [id number] Patient Account Number: [id number] Date of Birth/Sex: Treating RN: [birth year] (45 y.o. [name] Primary Care Provider: [name] Other Clinician: Referring Provider: Treating Provider/Extender: [name] in Treatment: 18 Subjective History of Present Illness (HPI) 07/15/2020 upon evaluation today patient presents for initial inspection here in our clinic concerning issues he has been having with the bottoms of his feet bilaterally. He states these actually occurred as wounds when he was hospitalized for 5 months secondary to Covid. He was apparently with tilting bed where he was in an upright position quite frequently and apparently this occurred in some way shape or form during that time. Fortunately there is no sign of active infection at this time. No fevers, chills, nausea, vomiting, or diarrhea. With that being said he still has substantial wounds on the plantar aspects of his feet Theragen require quite a bit of work to get these to heal. He has been using Santyl currently though that is been problematic both in receiving the medication as well as actually paid for it as it is become quite expensive. Prior to the experience with Covid the patient really did not have any major medical problems other than hypertension he does have some mild generalized weakness following the Covid experience. 07/22/2020 on evaluation today patient appears to be doing okay in regard to his foot ulcers I feel like the wound beds are showing signs of better improvement that I do believe the Iodoflex is helping in this regard. With that being said he does have a lot of drainage currently and this is somewhat blue/green in nature which is consistent with Pseudomonas. I do think a culture today would be appropriate for [place] to evaluate and see if that is indeed the case I would likely start him on antibiotic orally as well he is not allergic to Cipro knows  of no issues he has had in the past 12/21; patient was admitted to the clinic earlier this month with bilateral presumed pressure ulcers on the bottom of his feet apparently related to excessive pressure from a tilt table arrangement in the intensive care unit. Patient relates this to being on ECMO but I am not really sure that is exactly related to that. I must say I have never seen anything like this. He has fairly extensive full-thickness wounds extending from his heel towards his midfoot mostly centered laterally. There is already been some healing distally. He does not appear to have an arterial issue. He has been using gentamicin to the wound surfaces with Iodoflex to help with ongoing debridement 1/6; this is a patient with pressure ulcers on the bottom of his feet related to excessive pressure from a standing position in the intensive care unit. He is complaining of a lot of pain in the right heel. He is not a diabetic. 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 We have been using Iodoflex to help prepare the surfaces of both wounds for an advanced treatment product. He is nonambulatory spending most of his time in a wheelchair I have asked him not to propel the wheelchair with his heels 1/13; in general his  wounds look better not much surface area change we have been using Iodoflex as of last week. I did an x-ray of the right heel as the patient was complaining of pain. I had some thoughts about a stress fracture perhaps Achilles tendon problems however what it showed was erosive changes along the inferior aspect of the calcaneus he now has a MRI booked for 1/20. 1/20; in general his wounds continue to be better. Some improvement in the large narrow areas proximally in his foot. He is still complaining of pain in the right heel and tenderness in certain areas of this wound. His MRI is tonight. I am not just looking for osteomyelitis that was brought up on the x-ray I am  wondering about stress fractures, tendon ruptures etc. He has no such findings on the left. Also noteworthy is that the patient had critical illness neuropathy and some of the discomfort may be actual improvement in nerve function I am just not sure. These wounds were initially in the setting of severe critical illness related to COVID-19. He was put in a standing position. He may have also been on pressors at the point contributing to tissue ischemia. By his description at some point these wounds were grossly necrotic extending proximally up into the Achilles part of his heel. I do not know that I have ever really seen pictures of them like this although they may exist in epic We have ordered Tri layer Oasis. I am trying to stimulate some granulation in these areas. This is of course assuming the MRI is negative for infection 1/27; since the patient was last here he saw Dr. Juleen China of infectious disease. He is planned for vancomycin and ceftriaxone. Prior operative culture grew MSSA. Also ordered baseline lab work. He also ordered arterial studies although the ABIs in our clinic were normal as well as his clinical exam these were normal I do not think he needs to see vascular surgery. His ABIs at the PTA were 1.22 in the right triphasic waveforms with a normal TBI of 1.15 on the left ABI of 1.22 with triphasic waveforms and a normal TBI of 1.08. Finally he saw Dr. Amalia Hailey who will follow him in for 2 months. At this point I do not think he felt that he needed a procedure on the right calcaneal bone. Dr. Juleen China is elected for broad-spectrum antibiotic The patient is still having pain in the right heel. He walks with a walker 2/3; wounds are generally smaller. He is tolerating his IV antibiotics. I believe this is vancomycin and ceftriaxone. We are still waiting for Oasis burn in terms of his out-of-pocket max which he should be meeting soon given the IV antibiotics, MRIs etc. I have asked him to check  in on this. We are using silver collagen in the meantime the wounds look better 2/10; tolerating IV vancomycin and Rocephin. We are waiting to apply for Oasis. Although I am not really sure where he is in his out-of-pocket max. 2/17 started the first application of Oasis trilayer. Still on antibiotics. The wounds have generally look better. The area on the left has a little more surface slough requiring debridement 6/27; second application of Oasis trilayer. The wound surface granulation is generally look better. The area on the left with undermining laterally I think is come in a bit. 10/08/2020 upon evaluation today patient is here today for [employer] application #3. Fortunately he seems to be doing extremely well with regard to this and we are seeing  a lot of new epithelial growth which is great news. Fortunately there is no signs of active infection at this time. 10/16/2020 upon evaluation today patient appears to be doing well with regard to his foot ulcers. Do believe the Oasis has been of benefit for him. I do not see any signs of infection right now which is great news and I think that he has a lot of new epithelial growth which is great to see as well. The patient is very pleased to hear all of this. I do think we can proceed with the Oasis trilayer #4 today. 3/18; not as much improvement in these areas on his heels that I was hoping. I did reapply trilateral Oasis today the tissue looks healthier but not as much fill in as I was hoping. 3/25; better looking today I think this is come in a bit the tissue looks healthier. Triple layer Oasis reapplied #6 4/1; somewhat better looking definitely better looking surface not as much change in surface area as I was hoping. He may be spending more time [place] on days then he needs to although he does have heel offloading boots. Triple layer Oasis reapplied #7 4/7; unfortunately apparently [facility] will not approve any further Oasis  which is unfortunate since the patient did respond nicely both in terms of the condition of the wound bed as well as surface area. There is still some drainage coming from the wound but not a lot there does not appear to be any infection 4/15; we have been using Hydrofera Blue. He continues to have nice rims of epithelialization on the right greater than the left. The left the epithelialization is coming from the tip of his heel. There is moderate drainage. In this that concerns me about a total contact cast. There is no evidence of infection Objective Constitutional Patient is hypertensive.. Pulse regular and within target range for patient.Marland Kitchen Respirations regular, non-labored and within target range.. Temperature is normal and within the target range for the patient.Marland Kitchen Appears in no distress. Vitals Time Taken: 9:06 AM, Height: 69 in, Weight: 280 lbs, BMI: 41.3, Temperature: 98.1 F, Pulse: 79 bpm, Respiratory Rate: 18 breaths/min, Blood Pressure: 144/91 mmHg. General Notes: Wound exam; both wounds surfaces look satisfactory. There is rounded edges around both wound areas. This would be contained skin and subcutaneous tissue. The epithelialization is going over the surface of the wound which is over bone. Nevertheless new skin of any degree over this is a good thing to see. Integumentary (Hair, Skin) Wound #1 status is Open. Original cause of wound was Pressure Injury. The date acquired was: 10/07/2019. The wound has been in treatment 18 weeks. The wound is located on the Right Calcaneus. The wound measures 5.5cm length x 1.5cm width x 0.4cm depth; 6.48cm^2 area and 2.592cm^3 volume. There is Fat Layer (Subcutaneous Tissue) exposed. There is no tunneling or undermining noted. There is a medium amount of serosanguineous drainage noted. The wound margin is thickened. There is medium (34-66%) pink granulation within the wound bed. There is a medium (34-66%) amount of necrotic tissue within the  wound bed including Adherent Slough. Wound #2 status is Open. Original cause of wound was Pressure Injury. The date acquired was: 10/07/2019. The wound has been in treatment 18 weeks. The wound is located on the Left Calcaneus. The wound measures 4.9cm length x 1.6cm width x 0.4cm depth; 6.158cm^2 area and 2.463cm^3 volume. There is Fat Layer (Subcutaneous Tissue) exposed. There is no tunneling or undermining noted.  There is a medium amount of serosanguineous drainage noted. The wound margin is thickened. There is medium (34-66%) pink granulation within the wound bed. There is a medium (34-66%) amount of necrotic tissue within the wound bed including Adherent Slough. Assessment Active Problems ICD-10 Pressure ulcer of other site, stage 3 Muscle weakness (generalized) Essential (primary) hypertension Non-pressure chronic ulcer of other part of right foot with fat layer exposed Non-pressure chronic ulcer of other 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 Plan Follow-up Appointments: Return Appointment in 2 weeks. Bathing/ Shower/ Hygiene: May shower with protection but do not get wound dressing(s) wet. Edema Control - Lymphedema / SCD / Other: Elevate legs to the level of the heart or above for 30 minutes daily and/or when sitting, a frequency of: Avoid standing for long periods of time. Off-Loading: Wedge shoe to: - Gloped to bilateral feet Other: - keep pressure off of the bottom of your feet WOUND #1: - Calcaneus Wound Laterality: Right Cleanser: Normal Saline (Generic) Every Other Day/15 Days Discharge Instructions: Cleanse the wound with Normal Saline prior to applying a clean dressing using gauze sponges, not tissue or cotton balls. Cleanser: Wound Cleanser Every Other Day/15 Days Discharge Instructions: Cleanse the wound with wound cleanser prior to applying a clean dressing using gauze sponges, not tissue or cotton balls. Prim Dressing: Hydrofera Blue Classic Foam, 4x4 in (Dispense  As Written) Every Other Day/15 Days ary Discharge Instructions: Moisten with saline prior to applying to wound bed Secondary Dressing: Woven Gauze Sponge, Non-Sterile 4x4 in (Generic) Every Other Day/15 Days Discharge Instructions: Apply over primary dressing as directed. Secondary Dressing: ABD Pad, 8x10 (Generic) Every Other Day/15 Days Discharge Instructions: Apply over primary dressing as directed. Secondary Dressing: Zetuvit Plus 4x8 in (Generic) Every Other Day/15 Days Discharge Instructions: Apply over primary dressing as directed. Secured With: Elastic Bandage 4 inch (ACE bandage) Every Other Day/15 Days Discharge Instructions: Secure with ACE bandage as directed. Secured With: [employer], 4.5x3.1 (in/yd) (Generic) Every Other Day/15 Days Discharge Instructions: Secure with Kerlix as directed. WOUND #2: - Calcaneus Wound Laterality: Left Cleanser: Normal Saline (Generic) Every Other Day/15 Days Discharge Instructions: Cleanse the wound with Normal Saline prior to applying a clean dressing using gauze sponges, not tissue or cotton balls. Cleanser: Wound Cleanser Every Other Day/15 Days Discharge Instructions: Cleanse the wound with wound cleanser prior to applying a clean dressing using gauze sponges, not tissue or cotton balls. Prim Dressing: Hydrofera Blue Classic Foam, 4x4 in (Dispense As Written) Every Other Day/15 Days ary Discharge Instructions: Moisten with saline prior to applying to wound bed Secondary Dressing: Woven Gauze Sponge, Non-Sterile 4x4 in (Generic) Every Other Day/15 Days Discharge Instructions: Apply over primary dressing as directed. Secondary Dressing: ABD Pad, 8x10 (Generic) Every Other Day/15 Days Discharge Instructions: Apply over primary dressing as directed. Secondary Dressing: Zetuvit Plus 4x8 in (Generic) Every Other Day/15 Days Discharge Instructions: Apply over primary dressing as directed. Secured With: Elastic Bandage 4 inch (ACE bandage)  Every Other Day/15 Days Discharge Instructions: Secure with ACE bandage as directed. Secured With: [employer], 4.5x3.1 (in/yd) (Generic) Every Other Day/15 Days Discharge Instructions: Secure with Kerlix as directed. 1. I am going to continue with [facility] for now. 2. T contact cast is an option if the drainage will allow. I see no evidence of infection otal 3. We did not get any further approval for [facility] triple layer which is unfortunate. 4. We talked about offloading this short of the cast. He seems to  understand and his wife is on top of this as well Engineer, maintenance) Signed: 11/20/2020 4:57:27 PM By: Linton Ham MD Entered By: Linton Ham on 11/20/2020 09:48:51 -------------------------------------------------------------------------------- SuperBill Details Patient Name: Date of Service: [name], [place]. 11/20/2020 Medical Record Number: [id number] Patient Account Number: [id number] Date of Birth/Sex: Treating RN: [birth year] (45 y.o. [name] Primary Care Provider: [name] Other Clinician: Referring Provider: Treating Provider/Extender: [name] in Treatment: 18 Diagnosis Coding ICD-10 Codes Code Description [id number] Pressure ulcer of other site, stage 3 M62.81 Muscle weakness (generalized) I10 Essential (primary) hypertension L97.512 Non-pressure chronic ulcer of other part of right foot with fat layer exposed L97.522 Non-pressure chronic ulcer of other 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 Facility Procedures CPT4 Code: [id number] Description: 99214 - WOUND CARE VISIT-LEV 4 EST PT Modifier: Quantity: 1 Physician Procedures : CPT4 Code Description Modifier [id number] - WC PHYS LEVEL 3 - EST PT ICD-10 Diagnosis Description L97.512 Non-pressure chronic ulcer of other part of right foot with fat layer exposed L97.522 Non-pressure chronic ulcer of other part of left foot  with fat layer exposed Quantity:  1 Electronic Signature(s) Signed: 11/20/2020 4:57:27 PM By: Linton Ham MD Entered By: Linton Ham on 11/20/2020 09:49:11

## 2020-12-03 ENCOUNTER — Ambulatory Visit: Payer: Managed Care, Other (non HMO) | Admitting: Allergy & Immunology

## 2020-12-04 ENCOUNTER — Encounter (HOSPITAL_BASED_OUTPATIENT_CLINIC_OR_DEPARTMENT_OTHER): Payer: BC Managed Care – PPO | Admitting: Internal Medicine

## 2020-12-04 ENCOUNTER — Other Ambulatory Visit: Payer: Self-pay

## 2020-12-04 DIAGNOSIS — L97512 Non-pressure chronic ulcer of other part of right foot with fat layer exposed: Secondary | ICD-10-CM | POA: Diagnosis not present

## 2020-12-04 DIAGNOSIS — Z8616 Personal history of COVID-19: Secondary | ICD-10-CM | POA: Diagnosis not present

## 2020-12-04 DIAGNOSIS — Z8249 Family history of ischemic heart disease and other diseases of the circulatory system: Secondary | ICD-10-CM | POA: Diagnosis not present

## 2020-12-04 DIAGNOSIS — L97522 Non-pressure chronic ulcer of other 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: Secondary | ICD-10-CM | POA: Diagnosis not present

## 2020-12-04 DIAGNOSIS — L89893 Pressure ulcer of other site, stage 3: Secondary | ICD-10-CM

## 2020-12-04 DIAGNOSIS — S91309A Unspecified open wound, unspecified foot, initial encounter: Secondary | ICD-10-CM | POA: Diagnosis not present

## 2020-12-04 DIAGNOSIS — I10 Essential (primary) hypertension: Secondary | ICD-10-CM | POA: Diagnosis not present

## 2020-12-04 DIAGNOSIS — Z833 Family history of diabetes mellitus: Secondary | ICD-10-CM | POA: Diagnosis not present

## 2020-12-04 NOTE — Progress Notes (Signed)
[name], [name] ([id number]) Visit Report for 12/04/2020 Chief Complaint Document Details Patient Name: Date of Service: [name], [place] 12/04/2020 9:00 A M Medical Record Number: [id number] Patient Account Number: [id number] Date of Birth/Sex: Treating RN: 1975/11/12 (45 y.o. [name] Primary Care Provider: [name] Other Clinician: Referring Provider: Treating Provider/Extender: [name] in Treatment: 20 Information Obtained from: Patient Chief Complaint Bilateral Plantar Foot Ulcers Electronic Signature(s) Signed: 12/04/2020 12:46:20 PM By: [name] Shan DO Entered By: [name] on 12/04/2020 10:41:50 -------------------------------------------------------------------------------- Debridement Details Patient Name: Date of Service: [name], [place] 12/04/2020 9:00 A M Medical Record Number: [id number] Patient Account Number: [id number] Date of Birth/Sex: Treating RN: Jan 12, 1976 (45 y.o. [name] Primary Care Provider: [name] Other Clinician: Referring Provider: Treating Provider/Extender: [name] in Treatment: 20 Debridement Performed for Assessment: Wound #2 Left Calcaneus Performed By: Physician [name] Shan, DO Debridement Type: Debridement Level of Consciousness (Pre-procedure): Awake and Alert Pre-procedure Verification/Time Out Yes - 10:00 Taken: Start Time: 10:05 Pain Control: Other : Benzocaine T Area Debrided (L x W): otal 5.1 (cm) x 1.8 (cm) = 9.18 (cm) Tissue and other material debrided: Non-Viable, Callus, Slough, Subcutaneous, Slough Level: Skin/Subcutaneous Tissue Debridement Description: Excisional Instrument: Curette Bleeding: Minimum Hemostasis Achieved: Pressure End Time: 10:09 Response to Treatment: Procedure was tolerated well Level of Consciousness (Post- Awake and Alert procedure): Post Debridement Measurements of Total Wound Length: (cm) 5.1 Stage:  Category/Stage III Width: (cm) 1.8 Depth: (cm) 0.4 Volume: (cm) 2.884 Character of Wound/Ulcer Post Debridement: Stable Post Procedure Diagnosis Same as Pre-procedure Electronic Signature(s) Signed: 12/04/2020 12:46:20 PM By: [name] Shan DO Signed: 12/04/2020 5:13:52 PM By: [name] Entered By: [name] on 12/04/2020 10:13:19 -------------------------------------------------------------------------------- Debridement Details Patient Name: Date of Service: [name], [name] 12/04/2020 9:00 A M Medical Record Number: [id number] Patient Account Number: [id number] Date of Birth/Sex: Treating RN: June 24, 1976 (45 y.o. [name] Primary Care Provider: [name] Other Clinician: Referring Provider: Treating Provider/Extender: [name] in Treatment: 20 Debridement Performed for Assessment: Wound #1 Right Calcaneus Performed By: Physician [name] Shan, DO Debridement Type: Debridement Level of Consciousness (Pre-procedure): Awake and Alert Pre-procedure Verification/Time Out Yes - 10:00 Taken: Start Time: 10:01 Pain Control: Other : Benzocaine T Area Debrided (L x W): otal 5.3 (cm) x 1.4 (cm) = 7.42 (cm) Tissue and other material debrided: Non-Viable, Callus, Slough, Subcutaneous, Slough Level: Skin/Subcutaneous Tissue Debridement Description: Excisional Instrument: Curette Bleeding: Minimum Hemostasis Achieved: Pressure End Time: 10:05 Response to Treatment: Procedure was tolerated well Level of Consciousness (Post- Awake and Alert procedure): Post Debridement Measurements of Total Wound Length: (cm) 5.3 Stage: Category/Stage III Width: (cm) 1.4 Depth: (cm) 0.3 Volume: (cm) 1.748 Character of Wound/Ulcer Post Debridement: Stable Post Procedure Diagnosis Same as Pre-procedure Electronic Signature(s) Signed: 12/04/2020 12:46:20 PM By: [name] Shan DO Signed: 12/04/2020 5:13:52 PM By: [name] Entered By:  [name] on 12/04/2020 10:13:37 -------------------------------------------------------------------------------- HPI Details Patient Name: Date of Service: [name], [name] 12/04/2020 9:00 A M Medical Record Number: [id number] Patient Account Number: [id number] Date of Birth/Sex: Treating RN: Nov 20, 1975 (45 y.o. [name] Primary Care Provider: [name] Other Clinician: Referring Provider: Treating Provider/Extender: [name] in Treatment: 20 History of Present Illness HPI Description: 07/15/2020 upon evaluation today patient presents for initial inspection here in our clinic concerning issues he has been having with the bottoms of his feet bilaterally. He states these actually occurred as  wounds when he was hospitalized for 5 months secondary to Covid. He was apparently with tilting bed where he was in an upright position quite frequently and apparently this occurred in some way shape or form during that time. Fortunately there is no sign of active infection at this time. No fevers, chills, nausea, vomiting, or diarrhea. With that being said he still has substantial wounds on the plantar aspects of his feet Theragen require quite a bit of work to get these to heal. He has been using Santyl currently though that is been problematic both in receiving the medication as well as actually paid for it as it is become quite expensive. Prior to the experience with Covid the patient really did not have any major medical problems other than hypertension he does have some mild generalized weakness following the Covid experience. 07/22/2020 on evaluation today patient appears to be doing okay in regard to his foot ulcers I feel like the wound beds are showing signs of better improvement that I do believe the Iodoflex is helping in this regard. With that being said he does have a lot of drainage currently and this is somewhat blue/green in nature which is  consistent with Pseudomonas. I do think a culture today would be appropriate for [place] to evaluate and see if that is indeed the case I would likely start him on antibiotic orally as well he is not allergic to Cipro knows of no issues he has had in the past 12/21; patient was admitted to the clinic earlier this month with bilateral presumed pressure ulcers on the bottom of his feet apparently related to excessive pressure from a tilt table arrangement in the intensive care unit. Patient relates this to being on ECMO but I am not really sure that is exactly related to that. I must say I have never seen anything like this. He has fairly extensive full-thickness wounds extending from his heel towards his midfoot mostly centered laterally. There is already been some healing distally. He does not appear to have an arterial issue. He has been using gentamicin to the wound surfaces with Iodoflex to help with ongoing debridement 1/6; this is a patient with pressure ulcers on the bottom of his feet related to excessive pressure from a standing position in the intensive care unit. He is complaining of a lot of pain in the right heel. He is not a diabetic. 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 We have been using Iodoflex to help prepare the surfaces of both wounds for an advanced treatment product. He is nonambulatory spending most of his time in a wheelchair I have asked him not to propel the wheelchair with his heels 1/13; in general his wounds look better not much surface area change we have been using Iodoflex as of last week. I did an x-ray of the right heel as the patient was complaining of pain. I had some thoughts about a stress fracture perhaps Achilles tendon problems however what it showed was erosive changes along the inferior aspect of the calcaneus he now has a MRI booked for 1/20. 1/20; in general his wounds continue to be better. Some improvement in the large narrow areas  proximally in his foot. He is still complaining of pain in the right heel and tenderness in certain areas of this wound. His MRI is tonight. I am not just looking for osteomyelitis that was brought up on the x-ray I am wondering about stress fractures, tendon ruptures etc. He  has no such findings on the left. Also noteworthy is that the patient had critical illness neuropathy and some of the discomfort may be actual improvement in nerve function I am just not sure. These wounds were initially in the setting of severe critical illness related to COVID-19. He was put in a standing position. He may have also been on pressors at the point contributing to tissue ischemia. By his description at some point these wounds were grossly necrotic extending proximally up into the Achilles part of his heel. I do not know that I have ever really seen pictures of them like this although they may exist in epic We have ordered Tri layer Oasis. I am trying to stimulate some granulation in these areas. This is of course assuming the MRI is negative for infection 1/27; since the patient was last here he saw Dr. Juleen China of infectious disease. He is planned for vancomycin and ceftriaxone. Prior operative culture grew MSSA. Also ordered baseline lab work. He also ordered arterial studies although the ABIs in our clinic were normal as well as his clinical exam these were normal I do not think he needs to see vascular surgery. His ABIs at the [facility] were 1.22 in the right triphasic waveforms with a normal TBI of 1.15 on the left ABI of 1.22 with triphasic waveforms and a normal TBI of 1.08. Finally he saw Dr. Amalia Hailey who will follow him in for 2 months. At this point I do not think he felt that he needed a procedure on the right calcaneal bone. Dr. Juleen China is elected for broad-spectrum antibiotic The patient is still having pain in the right heel. He walks with a walker 2/3; wounds are generally smaller. He is tolerating his IV  antibiotics. I believe this is vancomycin and ceftriaxone. We are still waiting for Oasis burn in terms of his out-of-pocket max which he should be meeting soon given the IV antibiotics, MRIs etc. I have asked him to check in on this. We are using silver collagen in the meantime the wounds look better 2/10; tolerating IV vancomycin and Rocephin. We are waiting to apply for Oasis. Although I am not really sure where he is in his out-of-pocket max. 2/17 started the first application of Oasis trilayer. Still on antibiotics. The wounds have generally look better. The area on the left has a little more surface slough requiring debridement 6/80; second application of Oasis trilayer. The wound surface granulation is generally look better. The area on the left with undermining laterally I think is come in a bit. 10/08/2020 upon evaluation today patient is here today for [employer] application #3. Fortunately he seems to be doing extremely well with regard to this and we are seeing a lot of new epithelial growth which is great news. Fortunately there is no signs of active infection at this time. 10/16/2020 upon evaluation today patient appears to be doing well with regard to his foot ulcers. Do believe the Oasis has been of benefit for him. I do not see any signs of infection right now which is great news and I think that he has a lot of new epithelial growth which is great to see as well. The patient is very pleased to hear all of this. I do think we can proceed with the Oasis trilayer #4 today. 3/18; not as much improvement in these areas on his heels that I was hoping. I did reapply trilateral Oasis today the tissue looks healthier but not as much fill in  as I was hoping. 3/25; better looking today I think this is come in a bit the tissue looks healthier. Triple layer Oasis reapplied #6 4/1; somewhat better looking definitely better looking surface not as much change in surface area as I was hoping. He  may be spending more time [place] on days then he needs to although he does have heel offloading boots. Triple layer Oasis reapplied #7 4/7; unfortunately apparently [facility] will not approve any further Oasis which is unfortunate since the patient did respond nicely both in terms of the condition of the wound bed as well as surface area. There is still some drainage coming from the wound but not a lot there does not appear to be any infection 4/15; we have been using Hydrofera Blue. He continues to have nice rims of epithelialization on the right greater than the left. The left the epithelialization is coming from the tip of his heel. There is moderate drainage. In this that concerns me about a total contact cast. There is no evidence of infection 4/29; patient has been using Hydrofera Blue with dressing changes. He has no complaints or issues today. Electronic Signature(s) Signed: 12/04/2020 12:46:20 PM By: [name] Shan DO Entered By: [name] on 12/04/2020 12:41:34 -------------------------------------------------------------------------------- Physical Exam Details Patient Name: Date of Service: [name], [place] 12/04/2020 9:00 A M Medical Record Number: [id number] Patient Account Number: [id number] Date of Birth/Sex: Treating RN: 10-Jan-1976 (45 y.o. [name] Primary Care Provider: [name] Other Clinician: Referring Provider: Treating Provider/Extender: [name] in Treatment: 20 Constitutional respirations regular, non-labored and within target range for patient.. Cardiovascular 2+ dorsalis pedis/posterior tibialis pulses. Psychiatric pleasant and cooperative. Notes Left and right plantar foot wound: There is granulation tissue present with fibrotic debris and sloughing. No signs of infection. There is some epithelialization that is occurring around the edges. There is callus buildup. Electronic Signature(s) Signed:  12/04/2020 12:46:20 PM By: [name] Shan DO Entered By: [name] on 12/04/2020 12:43:07 -------------------------------------------------------------------------------- Physician Orders Details Patient Name: Date of Service: [name], [place] 12/04/2020 9:00 A M Medical Record Number: [id number] Patient Account Number: [id number] Date of Birth/Sex: Treating RN: Sep 06, 1975 (45 y.o. 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 - with Dr. Dellia Nims Return A Bathing/ Shower/ Hygiene May shower with protection but do not get wound dressing(s) wet. Edema Control - Lymphedema / SCD / Other Bilateral Lower Extremities Elevate legs to the level of the heart or above for 30 minutes daily and/or when sitting, a frequency of: Avoid standing for long periods of time. Off-Loading Wedge shoe to: - Gloped Shoe to bilateral feet Other: - keep pressure off of the bottom of your feet Additional Orders / Instructions Follow Nutritious Diet Wound Treatment Wound #1 - Calcaneus Wound Laterality: Right Cleanser: Normal Saline (Generic) Every Other Day/15 Days Discharge Instructions: Cleanse the wound with Normal Saline prior to applying a clean dressing using gauze sponges, not tissue or cotton balls. Cleanser: Wound Cleanser Every Other Day/15 Days Discharge Instructions: Cleanse the wound with  wound cleanser prior to applying a clean dressing using gauze sponges,  not tissue or cotton balls. Prim Dressing: Hydrofera Blue Classic Foam, 4x4 in (Dispense As Written) Every Other Day/15 Days ary Discharge Instructions: Moisten with saline prior to applying to wound bed Secondary Dressing: Woven Gauze Sponge, Non-Sterile 4x4 in (Generic) Every Other Day/15 Days Discharge Instructions: Apply over primary dressing as directed. Secondary Dressing: ABD Pad, 8x10 (Generic) Every Other Day/15 Days Discharge Instructions: Apply over primary dressing as directed. Secondary Dressing: Zetuvit Plus 4x8 in (DME) (Generic) Every Other Day/15 Days Discharge Instructions: Apply over primary dressing as directed. Secured With: Elastic Bandage 4 inch (ACE bandage) Every Other Day/15 Days Discharge Instructions: Secure with ACE bandage as directed. Secured With: [employer], 4.5x3.1 (in/yd) (DME) (Generic) Every Other Day/15 Days Discharge Instructions: Secure with Kerlix as directed. Wound #2 - Calcaneus Wound Laterality: Left Cleanser: Normal Saline (Generic) Every Other Day/15 Days Discharge Instructions: Cleanse the wound with Normal Saline prior to applying a clean dressing using gauze sponges, not tissue or cotton balls. Cleanser: Wound Cleanser Every Other Day/15 Days Discharge Instructions: Cleanse the wound with wound cleanser prior to applying a clean dressing using gauze sponges, not tissue or cotton balls. Prim Dressing: Hydrofera Blue Classic Foam, 4x4 in (Dispense As Written) Every Other Day/15 Days ary Discharge Instructions: Moisten with saline prior to applying to wound bed Secondary Dressing: Woven Gauze Sponge, Non-Sterile 4x4 in (Generic) Every Other Day/15 Days Discharge Instructions: Apply over primary dressing as directed. Secondary Dressing: ABD Pad, 8x10 (Generic) Every Other Day/15 Days Discharge Instructions: Apply over primary dressing as directed. Secondary Dressing: Zetuvit Plus 4x8 in (DME) (Generic) Every Other Day/15 Days Discharge  Instructions: Apply over primary dressing as directed. Secured With: Elastic Bandage 4 inch (ACE bandage) Every Other Day/15 Days Discharge Instructions: Secure with ACE bandage as directed. Secured With: [employer], 4.5x3.1 (in/yd) (DME) (Generic) Every Other Day/15 Days Discharge Instructions: Secure with Kerlix as directed. Notes Immunologist) Signed: 12/04/2020 12:46:20 PM By: [name] Shan DO Previous Signature: 12/04/2020 9:35:51 AM Version By: [name] Entered By: [name] on 12/04/2020 12:43:30 -------------------------------------------------------------------------------- Problem List Details Patient Name: Date of Service: [name], [place] 12/04/2020 9:00 A M Medical Record Number: [id number] Patient Account Number: [id number] Date of Birth/Sex: Treating RN: Oct 22, 1975 (45 y.o. [name] Primary Care Provider: [name] Other Clinician: Referring Provider: Treating Provider/Extender: [name] in Treatment: 20 Active Problems ICD-10 Encounter Code Description Active Date MDM Diagnosis [phone] Pressure ulcer of other site, stage 3 07/15/2020 No Yes M62.81 Muscle weakness (generalized) 07/15/2020 No Yes I10 Essential (primary) hypertension 07/15/2020 No Yes L97.512 Non-pressure chronic ulcer of other part of right foot with fat layer exposed 09/03/2020 No Yes L97.522 Non-pressure chronic ulcer of other 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 09/03/2020 No Yes Inactive Problems ICD-10 Code Description Active Date Inactive Date M86.171 Other acute osteomyelitis, right ankle and foot 09/03/2020 09/03/2020 Resolved Problems Electronic Signature(s) Signed: 12/04/2020 12:46:20 PM By: [name] Shan DO Previous Signature: 12/04/2020 9:21:16 AM Version By: [name] Entered By: [name] on 12/04/2020  10:41:34 -------------------------------------------------------------------------------- Progress Note Details Patient Name: Date of Service: [name], [place] 12/04/2020 9:00 A M Medical Record Number: [id number] Patient Account Number: [id number] Date of Birth/Sex: Treating RN: April 30, 1976 (45 y.o. [name] Primary Care Provider: [name] Other Clinician: Referring Provider: Treating Provider/Extender: [name] in Treatment: 20 Subjective Chief Complaint Information obtained from  Patient Bilateral Plantar Foot Ulcers History of Present Illness (HPI) 07/15/2020 upon evaluation today patient presents for initial inspection here in our clinic concerning issues he has been having with the bottoms of his feet bilaterally. He states these actually occurred as wounds when he was hospitalized for 5 months secondary to Covid. He was apparently with tilting bed where he was in an upright position quite frequently and apparently this occurred in some way shape or form during that time. Fortunately there is no sign of active infection at this time. No fevers, chills, nausea, vomiting, or diarrhea. With that being said he still has substantial wounds on the plantar aspects of his feet Theragen require quite a bit of work to get these to heal. He has been using Santyl currently though that is been problematic both in receiving the medication as well as actually paid for it as it is become quite expensive. Prior to the experience with Covid the patient really did not have any major medical problems other than hypertension he does have some mild generalized weakness following the Covid experience. 07/22/2020 on evaluation today patient appears to be doing okay in regard to his foot ulcers I feel like the wound beds are showing signs of better improvement that I do believe the Iodoflex is helping in this regard. With that being said he does have a lot of drainage  currently and this is somewhat blue/green in nature which is consistent with Pseudomonas. I do think a culture today would be appropriate for [place] to evaluate and see if that is indeed the case I would likely start him on antibiotic orally as well he is not allergic to Cipro knows of no issues he has had in the past 12/21; patient was admitted to the clinic earlier this month with bilateral presumed pressure ulcers on the bottom of his feet apparently related to excessive pressure from a tilt table arrangement in the intensive care unit. Patient relates this to being on ECMO but I am not really sure that is exactly related to that. I must say I have never seen anything like this. He has fairly extensive full-thickness wounds extending from his heel towards his midfoot mostly centered laterally. There is already been some healing distally. He does not appear to have an arterial issue. He has been using gentamicin to the wound surfaces with Iodoflex to help with ongoing debridement 1/6; this is a patient with pressure ulcers on the bottom of his feet related to excessive pressure from a standing position in the intensive care unit. He is complaining of a lot of pain in the right heel. He is not a diabetic. 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 We have been using Iodoflex to help prepare the surfaces of both wounds for an advanced treatment product. He is nonambulatory spending most of his time in a wheelchair I have asked him not to propel the wheelchair with his heels 1/13; in general his wounds look better not much surface area change we have been using Iodoflex as of last week. I did an x-ray of the right heel as the patient was complaining of pain. I had some thoughts about a stress fracture perhaps Achilles tendon problems however what it showed was erosive changes along the inferior aspect of the calcaneus he now has a MRI booked for 1/20. 1/20; in general his wounds  continue to be better. Some improvement in the large narrow areas proximally in his foot. He is still complaining  of pain in the right heel and tenderness in certain areas of this wound. His MRI is tonight. I am not just looking for osteomyelitis that was brought up on the x-ray I am wondering about stress fractures, tendon ruptures etc. He has no such findings on the left. Also noteworthy is that the patient had critical illness neuropathy and some of the discomfort may be actual improvement in nerve function I am just not sure. These wounds were initially in the setting of severe critical illness related to COVID-19. He was put in a standing position. He may have also been on pressors at the point contributing to tissue ischemia. By his description at some point these wounds were grossly necrotic extending proximally up into the Achilles part of his heel. I do not know that I have ever really seen pictures of them like this although they may exist in epic We have ordered Tri layer Oasis. I am trying to stimulate some granulation in these areas. This is of course assuming the MRI is negative for infection 1/27; since the patient was last here he saw Dr. Juleen China of infectious disease. He is planned for vancomycin and ceftriaxone. Prior operative culture grew MSSA. Also ordered baseline lab work. He also ordered arterial studies although the ABIs in our clinic were normal as well as his clinical exam these were normal I do not think he needs to see vascular surgery. His ABIs at the [facility] were 1.22 in the right triphasic waveforms with a normal TBI of 1.15 on the left ABI of 1.22 with triphasic waveforms and a normal TBI of 1.08. Finally he saw Dr. Amalia Hailey who will follow him in for 2 months. At this point I do not think he felt that he needed a procedure on the right calcaneal bone. Dr. Juleen China is elected for broad-spectrum antibiotic The patient is still having pain in the right heel. He walks with a  walker 2/3; wounds are generally smaller. He is tolerating his IV antibiotics. I believe this is vancomycin and ceftriaxone. We are still waiting for Oasis burn in terms of his out-of-pocket max which he should be meeting soon given the IV antibiotics, MRIs etc. I have asked him to check in on this. We are using silver collagen in the meantime the wounds look better 2/10; tolerating IV vancomycin and Rocephin. We are waiting to apply for Oasis. Although I am not really sure where he is in his out-of-pocket max. 2/17 started the first application of Oasis trilayer. Still on antibiotics. The wounds have generally look better. The area on the left has a little more surface slough requiring debridement 1/61; second application of Oasis trilayer. The wound surface granulation is generally look better. The area on the left with undermining laterally I think is come in a bit. 10/08/2020 upon evaluation today patient is here today for [employer] application #3. Fortunately he seems to be doing extremely well with regard to this and we are seeing a lot of new epithelial growth which is great news. Fortunately there is no signs of active infection at this time. 10/16/2020 upon evaluation today patient appears to be doing well with regard to his foot ulcers. Do believe the Oasis has been of benefit for him. I do not see any signs of infection right now which is great news and I think that he has a lot of new epithelial growth which is great to see as well. The patient is very pleased to hear all of this. I  do think we can proceed with the Oasis trilayer #4 today. 3/18; not as much improvement in these areas on his heels that I was hoping. I did reapply trilateral Oasis today the tissue looks healthier but not as much fill in as I was hoping. 3/25; better looking today I think this is come in a bit the tissue looks healthier. Triple layer Oasis reapplied #6 4/1; somewhat better looking definitely better  looking surface not as much change in surface area as I was hoping. He may be spending more time [place] on days then he needs to although he does have heel offloading boots. Triple layer Oasis reapplied #7 4/7; unfortunately apparently [facility] will not approve any further Oasis which is unfortunate since the patient did respond nicely both in terms of the condition of the wound bed as well as surface area. There is still some drainage coming from the wound but not a lot there does not appear to be any infection 4/15; we have been using Hydrofera Blue. He continues to have nice rims of epithelialization on the right greater than the left. The left the epithelialization is coming from the tip of his heel. There is moderate drainage. In this that concerns me about a total contact cast. There is no evidence of infection 4/29; patient has been using Hydrofera Blue with dressing changes. He has no complaints or issues today. Patient History Information obtained from Patient. Family History Cancer - Maternal Grandparents, Diabetes - Father,Paternal Grandparents, Heart Disease - Maternal Grandparents, Hypertension - Father,Paternal Grandparents, Lung Disease - Siblings, No family history of Hereditary Spherocytosis, Kidney Disease, Seizures, Stroke, Thyroid Problems, Tuberculosis. Social History Never smoker, Marital Status - Married, Alcohol Use - Never, Drug Use - No History, Caffeine Use - Daily - tea, soda. Medical History Eyes Denies history of Cataracts, Glaucoma, Optic Neuritis Ear/Nose/Mouth/Throat Denies history of Chronic sinus problems/congestion, Middle ear problems Hematologic/Lymphatic Denies history of Anemia, Hemophilia, Human Immunodeficiency Virus, Lymphedema, Sickle Cell Disease Respiratory Patient has history of Asthma Denies history of Aspiration, Chronic Obstructive Pulmonary Disease (COPD), Pneumothorax, Sleep Apnea, Tuberculosis Cardiovascular Patient has  history of Angina - with COVID, Hypertension Denies history of Arrhythmia, Congestive Heart Failure, Coronary Artery Disease, Deep Vein Thrombosis, Hypotension, Myocardial Infarction, Peripheral Arterial Disease, Peripheral Venous Disease, Phlebitis, Vasculitis Gastrointestinal Denies history of Cirrhosis , Colitis, Crohnoos, Hepatitis A, Hepatitis B, Hepatitis C Endocrine Denies history of Type I Diabetes, Type II Diabetes Genitourinary Denies history of End Stage Renal Disease Immunological Denies history of Lupus Erythematosus, Raynaudoos, Scleroderma Integumentary (Skin) Denies history of History of Burn Musculoskeletal Denies history of Gout, Rheumatoid Arthritis, Osteoarthritis, Osteomyelitis Neurologic Denies history of Dementia, Neuropathy, Quadriplegia, Paraplegia, Seizure Disorder Oncologic Denies history of Received Chemotherapy, Received Radiation Psychiatric Denies history of Anorexia/bulimia, Confinement Anxiety Hospitalization/Surgery History - COVID PNA 07/22/2019- 11/14/2019. - 03/27/2020 wound debridement/ skin graft. Medical A Surgical History Notes nd Constitutional Symptoms (General Health) COVID PNA 07/22/2019-11/14/2019 VENT ECMO, foot drop left foot , Genitourinary kidney stone Psychiatric anxiety Objective Constitutional respirations regular, non-labored and within target range for patient.. Vitals Time Taken: 9:18 AM, Height: 69 in, Source: Stated, Weight: 280 lbs, Source: Stated, BMI: 41.3, Temperature: 98.1 F, Pulse: 81 bpm, Respiratory Rate: 18 breaths/min, Blood Pressure: 158/96 mmHg. Cardiovascular 2+ dorsalis pedis/posterior tibialis pulses. Psychiatric pleasant and cooperative. General Notes: Left and right plantar foot wound: There is granulation tissue present with fibrotic debris and sloughing. No signs of infection. There is some epithelialization that is occurring around the edges. There is  callus buildup. Integumentary (Hair,  Skin) Wound #1 status is Open. Original cause of wound was Pressure Injury. The date acquired was: 10/07/2019. The wound has been in treatment 20 weeks. The wound is located on the Right Calcaneus. The wound measures 5.3cm length x 1.4cm width x 0.3cm depth; 5.828cm^2 area and 1.748cm^3 volume. There is Fat Layer (Subcutaneous Tissue) exposed. There is no tunneling or undermining noted. There is a medium amount of serosanguineous drainage noted. The wound margin is thickened. There is large (67-100%) red, pink granulation within the wound bed. There is a small (1-33%) amount of necrotic tissue within the wound bed including Adherent Slough. Wound #2 status is Open. Original cause of wound was Pressure Injury. The date acquired was: 10/07/2019. The wound has been in treatment 20 weeks. The wound is located on the Left Calcaneus. The wound measures 5.1cm length x 1.8cm width x 0.4cm depth; 7.21cm^2 area and 2.884cm^3 volume. There is Fat Layer (Subcutaneous Tissue) exposed. There is no tunneling or undermining noted. There is a medium amount of serosanguineous drainage noted. The wound margin is thickened. There is large (67-100%) red, pink granulation within the wound bed. There is a small (1-33%) amount of necrotic tissue within the wound bed including Adherent Slough. Assessment Active Problems ICD-10 Pressure ulcer of other site, stage 3 Muscle weakness (generalized) Essential (primary) hypertension Non-pressure chronic ulcer of other part of right foot with fat layer exposed Non-pressure chronic ulcer of other 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 Overall wounds appear healing with the left greater than the right. No signs of infection. We will continue with Hydrofera Blue. Nonviable tissue was debrided today. Procedures Wound #1 Pre-procedure diagnosis of Wound #1 is a Pressure Ulcer located on the Right Calcaneus . There was a Excisional Skin/Subcutaneous Tissue Debridement with a total  area of 7.42 sq cm performed by [name] Shan, DO. With the following instrument(s): Curette to remove Non-Viable tissue/material. Material removed includes Callus, Subcutaneous Tissue, and Slough after achieving pain control using Other (Benzocaine). No specimens were taken. A time out was conducted at 10:00, prior to the start of the procedure. A Minimum amount of bleeding was controlled with Pressure. The procedure was tolerated well. Post Debridement Measurements: 5.3cm length x 1.4cm width x 0.3cm depth; 1.748cm^3 volume. Post debridement Stage noted as Category/Stage III. Character of Wound/Ulcer Post Debridement is stable. Post procedure Diagnosis Wound #1: Same as Pre-Procedure Wound #2 Pre-procedure diagnosis of Wound #2 is a Pressure Ulcer located on the Left Calcaneus . There was a Excisional Skin/Subcutaneous Tissue Debridement with a total area of 9.18 sq cm performed by [name] Shan, DO. With the following instrument(s): Curette to remove Non-Viable tissue/material. Material removed includes Callus, Subcutaneous Tissue, and Slough after achieving pain control using Other (Benzocaine). No specimens were taken. A time out was conducted at 10:00, prior to the start of the procedure. A Minimum amount of bleeding was controlled with Pressure. The procedure was tolerated well. Post Debridement Measurements: 5.1cm length x 1.8cm width x 0.4cm depth; 2.884cm^3 volume. Post debridement Stage noted as Category/Stage III. Character of Wound/Ulcer Post Debridement is stable. Post procedure Diagnosis Wound #2: Same as Pre-Procedure Plan Follow-up Appointments: Return Appointment in 1 week. - with Dr. Arcola Jansky Shower/ Hygiene: May shower with protection but do not get wound dressing(s) wet. Edema Control - Lymphedema / SCD / Other: Elevate legs to the level of the heart or above for 30 minutes daily and/or when sitting, a frequency of: Avoid standing for long periods  of  time. Off-Loading: Wedge shoe to: - Gloped Shoe to bilateral feet Other: - keep pressure off of the bottom of your feet Additional Orders / Instructions: Follow Nutritious Diet General Notes: [employer] Supp[employer] WOUND #1: - Calcaneus Wound Laterality: Right Cleanser: Normal Saline (Generic) Every Other Day/15 Days Discharge Instructions: Cleanse the wound with Normal Saline prior to applying a clean dressing using gauze sponges, not tissue or cotton balls. Cleanser: Wound Cleanser Every Other Day/15 Days Discharge Instructions: Cleanse the wound with wound cleanser prior to applying a clean dressing using gauze sponges, not tissue or cotton balls. Prim Dressing: Hydrofera Blue Classic Foam, 4x4 in (Dispense As Written) Every Other Day/15 Days ary Discharge Instructions: Moisten with saline prior to applying to wound bed Secondary Dressing: Woven Gauze Sponge, Non-Sterile 4x4 in (Generic) Every Other Day/15 Days Discharge Instructions: Apply over primary dressing as directed. Secondary Dressing: ABD Pad, 8x10 (Generic) Every Other Day/15 Days Discharge Instructions: Apply over primary dressing as directed. Secondary Dressing: Zetuvit Plus 4x8 in (DME) (Generic) Every Other Day/15 Days Discharge Instructions: Apply over primary dressing as directed. Secured With: Elastic Bandage 4 inch (ACE bandage) Every Other Day/15 Days Discharge Instructions: Secure with ACE bandage as directed. Secured With: [employer], 4.5x3.1 (in/yd) (DME) (Generic) Every Other Day/15 Days Discharge Instructions: Secure with Kerlix as directed. WOUND #2: - Calcaneus Wound Laterality: Left Cleanser: Normal Saline (Generic) Every Other Day/15 Days Discharge Instructions: Cleanse the wound with Normal Saline prior to applying a clean dressing using gauze sponges, not tissue or cotton balls. Cleanser: Wound Cleanser Every Other Day/15 Days Discharge Instructions: Cleanse the wound with wound cleanser prior to  applying a clean dressing using gauze sponges, not tissue or cotton balls. Prim Dressing: Hydrofera Blue Classic Foam, 4x4 in (Dispense As Written) Every Other Day/15 Days ary Discharge Instructions: Moisten with saline prior to applying to wound bed Secondary Dressing: Woven Gauze Sponge, Non-Sterile 4x4 in (Generic) Every Other Day/15 Days Discharge Instructions: Apply over primary dressing as directed. Secondary Dressing: ABD Pad, 8x10 (Generic) Every Other Day/15 Days Discharge Instructions: Apply over primary dressing as directed. Secondary Dressing: Zetuvit Plus 4x8 in (DME) (Generic) Every Other Day/15 Days Discharge Instructions: Apply over primary dressing as directed. Secured With: Elastic Bandage 4 inch (ACE bandage) Every Other Day/15 Days Discharge Instructions: Secure with ACE bandage as directed. Secured With: [employer], 4.5x3.1 (in/yd) (DME) (Generic) Every Other Day/15 Days Discharge Instructions: Secure with Kerlix as directed. 1. Hydrofera Blue 2. Follow-up in 1 week Electronic Signature(s) Signed: 12/04/2020 12:46:20 PM By: [name] Shan DO Entered By: [name] on 12/04/2020 12:44:48 -------------------------------------------------------------------------------- HxROS Details Patient Name: Date of Service: [name], Olivia Lopez de Gutierrez 12/04/2020 9:00 A M Medical Record Number: [id number] Patient Account Number: [id number] Date of Birth/Sex: Treating RN: May 24, 1976 (45 y.o. [name] Primary Care Provider: [name] Other Clinician: Referring Provider: Treating Provider/Extender: [name] in Treatment: 20 Information Obtained From Patient Constitutional Symptoms (General Health) Medical History: Past Medical History Notes: COVID PNA 07/22/2019-11/14/2019 VENT ECMO, foot drop left foot , Eyes Medical History: Negative for: Cataracts; Glaucoma; Optic Neuritis Ear/Nose/Mouth/Throat Medical History: Negative for:  Chronic sinus problems/congestion; Middle ear problems Hematologic/Lymphatic Medical History: Negative for: Anemia; Hemophilia; Human Immunodeficiency Virus; Lymphedema; Sickle Cell Disease Respiratory Medical History: Positive for: Asthma Negative for: Aspiration; Chronic Obstructive Pulmonary Disease (COPD); Pneumothorax; Sleep Apnea; Tuberculosis Cardiovascular Medical History: Positive for: Angina - with COVID; Hypertension Negative for: Arrhythmia; Congestive Heart Failure; Coronary Artery Disease; Deep Vein Thrombosis; Hypotension;  Myocardial Infarction; Peripheral Arterial Disease; Peripheral Venous Disease; Phlebitis; Vasculitis Gastrointestinal Medical History: Negative for: Cirrhosis ; Colitis; Crohns; Hepatitis A; Hepatitis B; Hepatitis C Endocrine Medical History: Negative for: Type I Diabetes; Type II Diabetes Genitourinary Medical History: Negative for: End Stage Renal Disease Past Medical History Notes: kidney stone Immunological Medical History: Negative for: Lupus Erythematosus; Raynauds; Scleroderma Integumentary (Skin) Medical History: Negative for: History of Burn Musculoskeletal Medical History: Negative for: Gout; Rheumatoid Arthritis; Osteoarthritis; Osteomyelitis Neurologic Medical History: Negative for: Dementia; Neuropathy; Quadriplegia; Paraplegia; Seizure Disorder Oncologic Medical History: Negative for: Received Chemotherapy; Received Radiation Psychiatric Medical History: Negative for: Anorexia/bulimia; Confinement Anxiety Past Medical History Notes: anxiety Immunizations Pneumococcal Vaccine: Received Pneumococcal Vaccination: No Implantable Devices None Hospitalization / Surgery History Type of Hospitalization/Surgery COVID PNA 07/22/2019- 11/14/2019 03/27/2020 wound debridement/ skin graft Family and Social History Cancer: Yes - Maternal Grandparents; Diabetes: Yes - Father,Paternal Grandparents; Heart Disease: Yes - Maternal  Grandparents; Hereditary Spherocytosis: No; Hypertension: Yes - Father,Paternal Grandparents; Kidney Disease: No; Lung Disease: Yes - Siblings; Seizures: No; Stroke: No; Thyroid Problems: No; Tuberculosis: No; Never smoker; Marital Status - Married; Alcohol Use: Never; Drug Use: No History; Caffeine Use: Daily - tea, soda; Financial Concerns: No; Food, Clothing or Shelter Needs: No; Support System Lacking: No; Transportation Concerns: No Electronic Signature(s) Signed: 12/04/2020 12:46:20 PM By: [name] Shan DO Signed: 12/04/2020 5:13:52 PM By: [name] Entered By: [name] on 12/04/2020 12:41:40 -------------------------------------------------------------------------------- [place] Details Patient Name: Date of Service: [name], [place]. 12/04/2020 Medical Record Number: [id number] Patient Account Number: [id number] Date of Birth/Sex: Treating RN: 11/20/1975 (45 y.o. [name] Primary Care Provider: [name] Other Clinician: Referring Provider: Treating Provider/Extender: [name] in Treatment: 20 Diagnosis Coding ICD-10 Codes Code Description [id number] Pressure ulcer of other site, stage 3 M62.81 Muscle weakness (generalized) I10 Essential (primary) hypertension L97.512 Non-pressure chronic ulcer of other part of right foot with fat layer exposed L97.522 Non-pressure chronic ulcer of other 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 Facility Procedures CPT4 Code: [id number] Description: [place] - DEB SUBQ TISSUE 20 SQ CM/< ICD-10 Diagnosis Description L97.512 Non-pressure chronic ulcer of other part of right foot with fat layer exposed L97.522 Non-pressure chronic ulcer of other 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 Modifier: Quantity: 1 Physician Procedures : CPT4 Code Description Modifier [id number] - WC PHYS SUBQ TISS 20 SQ CM ICD-10 Diagnosis Description L97.512 Non-pressure chronic ulcer of other part of right foot with  fat layer exposed L97.522 Non-pressure chronic ulcer of other part of left foot  with fat layer exposed Quantity: 1 Electronic Signature(s) Signed: 12/04/2020 12:46:20 PM By: [name] Shan DO Entered By: [name] on 12/04/2020 12:45:47

## 2020-12-04 NOTE — Progress Notes (Signed)
TANNON, PEERSON ([id number]) Visit Report for 12/04/2020 Arrival Information Details Patient Name: Date of Service: [name], [place] 12/04/2020 9:00 A M Medical Record Number: [id number] Patient Account Number: [id number] Date of Birth/Sex: Treating RN: 29-Jan-1976 (45 y.o. [name] Primary Care Simora Dingee: [name] Other Clinician: Referring Sesilia Poucher: Treating Kilan [name]/Extender: [name] in Treatment: 40 Visit Information History Since Last Visit Added or deleted any medications: No Patient Arrived: Wheel Chair Any new allergies or adverse reactions: No Arrival Time: 09:00 Had a fall or experienced change in No Accompanied By: spouse activities of daily living that may affect Transfer Assistance: None risk of falls: Patient Identification Verified: Yes Signs or symptoms of abuse/neglect since last visito No Secondary Verification Process Completed: Yes Hospitalized since last visit: No Patient Requires Transmission-Based Precautions: No Implantable device outside of the clinic excluding No Patient Has Alerts: No cellular tissue based products placed in the center since last visit: Has Dressing in Place as Prescribed: Yes Has Footwear/Offloading in Place as Prescribed: Yes Left: Other:globoped Right: Other:globoped Pain Present Now: Yes Electronic Signature(s) Signed: 12/04/2020 5:21:08 PM By: Baruch Gouty RN, BSN Entered By: Baruch Gouty on 12/04/2020 09:19:50 -------------------------------------------------------------------------------- Encounter Discharge Information Details Patient Name: Date of Service: [name], [place]. 12/04/2020 9:00 A M Medical Record Number: [id number] Patient Account Number: [id number] Date of Birth/Sex: Treating RN: [birth year] (45 y.o. [name] Primary Care Akaash Vandewater: [name] Other Clinician: Referring Tecla Mailloux: Treating Tanda Morrissey/Extender: [name] in Treatment:  20 Encounter Discharge Information Items Post Procedure Vitals Discharge Condition: Stable Temperature (F): 98.1 Ambulatory Status: Wheelchair Pulse (bpm): 81 Discharge Destination: Home Respiratory Rate (breaths/min): 18 Transportation: Private Auto Blood Pressure (mmHg): 158/96 Accompanied By: wife Schedule Follow-up Appointment: Yes Clinical Summary of Care: Electronic Signature(s) Signed: 12/04/2020 5:30:47 PM By: [name] Entered By: [name] on 12/04/2020 11:38:57 -------------------------------------------------------------------------------- Lower Extremity Assessment Details Patient Name: Date of Service: [name], [place] 12/04/2020 9:00 A M Medical Record Number: [id number] Patient Account Number: [id number] Date of Birth/Sex: Treating RN: Jun 26, 1976 (45 y.o. [name] Primary Care Ieisha Gao: [name] Other Clinician: Referring Zona Pedro: Treating Justeen Hehr/Extender: [name] in Treatment: 20 Edema Assessment Assessed: [name]: No] [name]: No] Edema: [Left: Yes] [Right: Yes] Calf Left: Right: Point of Measurement: 29 cm From Medial Instep 43 cm 43.5 cm Ankle Left: Right: Point of Measurement: 9 cm From Medial Instep 24.3 cm 24.3 cm Vascular Assessment Pulses: Dorsalis Pedis Palpable: [Left:Yes] [Right:Yes] Electronic Signature(s) Signed: 12/04/2020 5:21:08 PM By: Baruch Gouty RN, BSN Entered By: Baruch Gouty on 12/04/2020 09:22:11 -------------------------------------------------------------------------------- Multi Wound Chart Details Patient Name: Date of Service: [name], [name] 12/04/2020 9:00 A M Medical Record Number: [id number] Patient Account Number: [id number] Date of Birth/Sex: Treating RN: 1976/01/06 (45 y.o. [name] Primary Care Jacksyn Beeks: [name] Other Clinician: Referring Avah Bashor: Treating Aniylah Avans/Extender: [name] in Treatment: 20 Vital  Signs Height(in): 37 Pulse(bpm): 47 Weight(lbs): 280 Blood Pressure(mmHg): 158/96 Body Mass Index(BMI): 41 Temperature(F): 98.1 Respiratory Rate(breaths/min): 18 Photos: [1:No Photos Right Calcaneus] [2:No Photos Left Calcaneus] [N/A:N/A N/A] Wound Location: [1:Pressure Injury] [2:Pressure Injury] [N/A:N/A] Wounding Event: [1:Pressure Ulcer] [2:Pressure Ulcer] [N/A:N/A] Primary Etiology: [1:Asthma, Angina, Hypertension] [2:Asthma, Angina, Hypertension] [N/A:N/A] Comorbid History: [1:10/07/2019] [2:10/07/2019] [N/A:N/A] Date Acquired: [1:20] [2:20] [N/A:N/A] Weeks of Treatment: [1:Open] [2:Open] [N/A:N/A] Wound Status: [1:5.3x1.4x0.3] [2:5.1x1.8x0.4] [N/A:N/A] Measurements L x W x D (cm) [1:5.828] [2:7.21] [N/A:N/A] A (cm) : rea [1:1.748] [2:2.884] [N/A:N/A] Volume (cm) : [  1:81.60%] [2:73.40%] [N/A:N/A] % Reduction in A rea: [1:94.50%] [2:89.40%] [N/A:N/A] % Reduction in Volume: [1:Category/Stage III] [2:Category/Stage III] [N/A:N/A] Classification: [1:Medium] [2:Medium] [N/A:N/A] Exudate A mount: [1:Serosanguineous] [2:Serosanguineous] [N/A:N/A] Exudate Type: [1:red, brown] [2:red, brown] [N/A:N/A] Exudate Color: [1:Thickened] [2:Thickened] [N/A:N/A] Wound Margin: [1:Large (67-100%)] [2:Large (67-100%)] [N/A:N/A] Granulation A mount: [1:Red, Pink] [2:Red, Pink] [N/A:N/A] Granulation Quality: [1:Small (1-33%)] [2:Small (1-33%)] [N/A:N/A] Necrotic A mount: [1:Fat Layer (Subcutaneous Tissue): Yes Fat Layer (Subcutaneous Tissue): Yes N/A] Exposed Structures: [1:Fascia: No Tendon: No Muscle: No Joint: No Bone: No Small (1-33%)] [2:Fascia: No Tendon: No Muscle: No Joint: No Bone: No Small (1-33%)] [N/A:N/A] Epithelialization: [1:Debridement - Excisional] [2:Debridement - Excisional] [N/A:N/A] Debridement: Pre-procedure Verification/Time Out 10:00 [2:10:00] [N/A:N/A] Taken: [1:Other] [2:Other] [N/A:N/A] Pain Control: [1:Callus, Subcutaneous, Slough] [2:Callus, Subcutaneous, Slough]  [N/A:N/A] Tissue Debrided: [1:Skin/Subcutaneous Tissue] [2:Skin/Subcutaneous Tissue] [N/A:N/A] Level: [1:7.42] [2:9.18] [N/A:N/A] Debridement A (sq cm): [1:rea Curette] [2:Curette] [N/A:N/A] Instrument: [1:Minimum] [2:Minimum] [N/A:N/A] Bleeding: [1:Pressure] [2:Pressure] [N/A:N/A] Hemostasis A chieved: [1:Procedure was tolerated well] [2:Procedure was tolerated well] [N/A:N/A] Debridement Treatment Response: [1:5.3x1.4x0.3] [2:5.1x1.8x0.4] [N/A:N/A] Post Debridement Measurements L x W x D (cm) [1:1.748] [2:2.884] [N/A:N/A] Post Debridement Volume: (cm) [1:Category/Stage III] [2:Category/Stage III] [N/A:N/A] Post Debridement Stage: [1:Debridement] [2:Debridement] [N/A:N/A] Treatment Notes Electronic Signature(s) Signed: 12/04/2020 12:46:20 PM By: Kalman Shan DO Signed: 12/04/2020 5:13:52 PM By: Lorrin Jackson Entered By: Kalman Shan on 12/04/2020 10:41:42 -------------------------------------------------------------------------------- Multi-Disciplinary Care Plan Details Patient Name: Date of Service: [name], [place] 12/04/2020 9:00 A M Medical Record Number: [id number] Patient Account Number: [id number] Date of Birth/Sex: Treating RN: 1975-11-16 (45 y.o. [name] Primary Care Jadira Nierman: [name] Other Clinician: Referring Tracia Lacomb: Treating Levaeh Vice/Extender: [name] in Treatment: [place] reviewed with physician Active Inactive Abuse / Safety / Falls / Self Care Management Nursing Diagnoses: Potential for falls Goals: Patient/caregiver will verbalize/demonstrate measures taken to prevent injury and/or falls Date Initiated: 07/15/2020 Target Resolution Date: 12/11/2020 Goal Status: Active Interventions: Assess fall risk on admission and as needed Assess impairment of mobility on admission and as needed per policy Notes: Wound/Skin Impairment Nursing Diagnoses: Impaired tissue integrity Knowledge  deficit related to ulceration/compromised skin integrity Goals: Patient/caregiver will verbalize understanding of skin care regimen Date Initiated: 07/15/2020 Date Inactivated: 12/04/2020 Target Resolution Date: 12/04/2020 Goal Status: Met Ulcer/skin breakdown will have a volume reduction of 30% by week 4 Date Initiated: 07/15/2020 Date Inactivated: 08/20/2020 Target Resolution Date: 09/03/2020 Goal Status: Unmet Unmet Reason: no major changes. Ulcer/skin breakdown will heal within 14 weeks Date Initiated: 12/04/2020 Target Resolution Date: 01/01/2021 Goal Status: Active Interventions: Assess patient/caregiver ability to obtain necessary supplies Assess patient/caregiver ability to perform ulcer/skin care regimen upon admission and as needed Assess ulceration(s) every visit Provide education on ulcer and skin care Treatment Activities: Skin care regimen initiated : 07/15/2020 Topical wound management initiated : 07/15/2020 Notes: Electronic Signature(s) Signed: 12/04/2020 5:13:52 PM By: Lorrin Jackson Previous Signature: 12/04/2020 9:37:55 AM Version By: Lorrin Jackson Previous Signature: 12/04/2020 9:36:07 AM Version By: Lorrin Jackson Entered By: Lorrin Jackson on 12/04/2020 10:18:29 -------------------------------------------------------------------------------- Pain Assessment Details Patient Name: Date of Service: [name], [place] 12/04/2020 9:00 A M Medical Record Number: [id number] Patient Account Number: [id number] Date of Birth/Sex: Treating RN: Dec 29, 1975 (45 y.o. [name] Primary Care Zaron Zwiefelhofer: [name] Other Clinician: Referring Jakyia Gaccione: Treating Marcellous Snarski/Extender: [name] in Treatment: 20 Active Problems Location of Pain Severity and Description of Pain Patient Has Paino Yes Site Locations Pain Location: Pain Location: Pain in Ulcers With Dressing Change:  Yes Duration of the Pain. Constant / Intermittento  Intermittent Rate the pain. Current Pain Level: 0 Worst Pain Level: 4 Least Pain Level: 0 Character of Pain Describe the Pain: Aching, Tender Pain Management and Medication Current Pain Management: Medication: Yes Is the Current Pain Management Adequate: Adequate Rest: Yes How does your wound impact your activities of daily livingo Sleep: No Bathing: No Appetite: No Relationship With Others: No Bladder Continence: No Emotions: No Bowel Continence: No Work: No Toileting: No Drive: No Dressing: No Hobbies: No Notes reports tenderness with walking Electronic Signature(s) Signed: 12/04/2020 5:21:08 PM By: Baruch Gouty RN, BSN Entered By: Baruch Gouty on 12/04/2020 09:21:07 -------------------------------------------------------------------------------- Patient/Caregiver Education Details Patient Name: Date of Service: GO [place], [name] 4/29/2022andnbsp9:00 [place] Record Number: [id number] Patient Account Number: [id number] Date of Birth/Gender: Treating RN: January 19, 1976 (45 y.o. [name] Primary Care Physician: [name] Other Clinician: Referring Physician: Treating Physician/Extender: [name] in Treatment: 20 Education Assessment Education Provided To: Patient Education Topics Provided Wound/Skin Impairment: Methods: Demonstration, Explain/Verbal, Printed Responses: State content correctly [employer]) Signed: 12/04/2020 5:13:52 PM By: Lorrin Jackson Entered By: Lorrin Jackson on 12/04/2020 09:36:41 -------------------------------------------------------------------------------- Wound Assessment Details Patient Name: Date of Service: [name], [name] 12/04/2020 9:00 A M Medical Record Number: [id number] Patient Account Number: [id number] Date of Birth/Sex: Treating RN: 1976/03/24 (45 y.o. [name] Primary Care Brinlyn Cena: [name] Other Clinician: Referring Alianna Wurster: Treating  Watt Geiler/Extender: [name] in Treatment: 20 Wound Status Wound Number: 1 Primary Etiology: Pressure Ulcer Wound Location: Right Calcaneus Wound Status: Open Wounding Event: Pressure Injury Comorbid History: Asthma, Angina, Hypertension Date Acquired: 10/07/2019 Weeks Of Treatment: 20 Clustered Wound: No Photos Wound Measurements Length: (cm) 5.3 Width: (cm) 1.4 Depth: (cm) 0.3 Area: (cm) 5.828 Volume: (cm) 1.748 % Reduction in Area: 81.6% % Reduction in Volume: 94.5% Epithelialization: Small (1-33%) Tunneling: No Undermining: No Wound Description Classification: Category/Stage III Wound Margin: Thickened Exudate Amount: Medium Exudate Type: Serosanguineous Exudate Color: red, brown Foul Odor After Cleansing: No Slough/Fibrino Yes Wound Bed Granulation Amount: Large (67-100%) Exposed Structure Granulation Quality: Red, Pink Fascia Exposed: No Necrotic Amount: Small (1-33%) Fat Layer (Subcutaneous Tissue) Exposed: Yes Necrotic Quality: Adherent Slough Tendon Exposed: No Muscle Exposed: No Joint Exposed: No Bone Exposed: No Treatment Notes Wound #1 (Calcaneus) Wound Laterality: Right Cleanser Normal Saline Discharge Instruction: Cleanse the wound with Normal Saline prior to applying a clean dressing using gauze sponges, not tissue or cotton balls. Wound Cleanser Discharge Instruction: Cleanse the wound with wound cleanser prior to applying a clean dressing using gauze sponges, not tissue or cotton balls. Peri-Wound Care Topical Primary Dressing Hydrofera Blue Classic Foam, 4x4 in Discharge Instruction: Moisten with saline prior to applying to wound bed Secondary Dressing Woven Gauze Sponge, Non-Sterile 4x4 in Discharge Instruction: Apply over primary dressing as directed. ABD Pad, 8x10 Discharge Instruction: Apply over primary dressing as directed. Zetuvit Plus 4x8 in Discharge Instruction: Apply over primary dressing as  directed. Secured With Elastic Bandage 4 inch (ACE bandage) Discharge Instruction: Secure with ACE bandage as directed. Kerlix Roll Sterile, 4.5x3.1 (in/yd) Discharge Instruction: Secure with Kerlix as directed. Compression Wrap Compression Stockings Add-Ons Electronic Signature(s) Signed: 12/04/2020 4:35:52 PM By: Sandre Kitty Signed: 12/04/2020 5:21:08 PM By: Baruch Gouty RN, BSN Entered By: Sandre Kitty on 12/04/2020 16:24:48 -------------------------------------------------------------------------------- Wound Assessment Details Patient Name: Date of Service: [name], [name] 12/04/2020 9:00 A M Medical Record Number: [id number] Patient Account Number: [id number] Date of  Birth/Sex: Treating RN: July 29, 1976 (45 y.o. [name] Primary Care Braylea Brancato: [name] Other Clinician: Referring Hawley Michel: Treating Jeremyah Jelley/Extender: [name] in Treatment: 20 Wound Status Wound Number: 2 Primary Etiology: Pressure Ulcer Wound Location: Left Calcaneus Wound Status: Open Wounding Event: Pressure Injury Comorbid History: Asthma, Angina, Hypertension Date Acquired: 10/07/2019 Weeks Of Treatment: 20 Clustered Wound: No Photos Wound Measurements Length: (cm) 5.1 Width: (cm) 1.8 Depth: (cm) 0.4 Area: (cm) 7.21 Volume: (cm) 2.884 % Reduction in Area: 73.4% % Reduction in Volume: 89.4% Epithelialization: Small (1-33%) Tunneling: No Undermining: No Wound Description Classification: Category/Stage III Wound Margin: Thickened Exudate Amount: Medium Exudate Type: Serosanguineous Exudate Color: red, brown Foul Odor After Cleansing: No Slough/Fibrino Yes Wound Bed Granulation Amount: Large (67-100%) Exposed Structure Granulation Quality: Red, Pink Fascia Exposed: No Necrotic Amount: Small (1-33%) Fat Layer (Subcutaneous Tissue) Exposed: Yes Necrotic Quality: Adherent Slough Tendon Exposed: No Muscle Exposed: No Joint Exposed:  No Bone Exposed: No Treatment Notes Wound #2 (Calcaneus) Wound Laterality: Left Cleanser Normal Saline Discharge Instruction: Cleanse the wound with Normal Saline prior to applying a clean dressing using gauze sponges, not tissue or cotton balls. Wound Cleanser Discharge Instruction: Cleanse the wound with wound cleanser prior to applying a clean dressing using gauze sponges, not tissue or cotton balls. Peri-Wound Care Topical Primary Dressing Hydrofera Blue Classic Foam, 4x4 in Discharge Instruction: Moisten with saline prior to applying to wound bed Secondary Dressing Woven Gauze Sponge, Non-Sterile 4x4 in Discharge Instruction: Apply over primary dressing as directed. ABD Pad, 8x10 Discharge Instruction: Apply over primary dressing as directed. Zetuvit Plus 4x8 in Discharge Instruction: Apply over primary dressing as directed. Secured With Elastic Bandage 4 inch (ACE bandage) Discharge Instruction: Secure with ACE bandage as directed. Kerlix Roll Sterile, 4.5x3.1 (in/yd) Discharge Instruction: Secure with Kerlix as directed. Compression Wrap Compression Stockings Add-Ons Electronic Signature(s) Signed: 12/04/2020 4:35:52 PM By: Sandre Kitty Signed: 12/04/2020 5:21:08 PM By: Baruch Gouty RN, BSN Entered By: Sandre Kitty on 12/04/2020 16:25:08 -------------------------------------------------------------------------------- Vitals Details Patient Name: Date of Service: [name], [place] 12/04/2020 9:00 A M Medical Record Number: [id number] Patient Account Number: [id number] Date of Birth/Sex: Treating RN: 1975/09/05 (45 y.o. [name] Primary Care Seleste Tallman: [name] Other Clinician: Referring Lenea Bywater: Treating Elida Harbin/Extender: [name] in Treatment: 20 Vital Signs Time Taken: 09:18 Temperature (F): 98.1 Height (in): 69 Pulse (bpm): 81 Source: Stated Respiratory Rate (breaths/min): 18 Weight (lbs): 280 Blood  Pressure (mmHg): 158/96 Source: Stated Reference Range: 80 - 120 mg / dl Body Mass Index (BMI): 41.3 Electronic Signature(s) Signed: 12/04/2020 5:21:08 PM By: Baruch Gouty RN, BSN Entered By: Baruch Gouty on 12/04/2020 09:20:14

## 2020-12-07 DIAGNOSIS — U071 COVID-19: Secondary | ICD-10-CM | POA: Diagnosis not present

## 2020-12-07 DIAGNOSIS — G4734 Idiopathic sleep related nonobstructive alveolar hypoventilation: Secondary | ICD-10-CM | POA: Diagnosis not present

## 2020-12-10 ENCOUNTER — Encounter (HOSPITAL_BASED_OUTPATIENT_CLINIC_OR_DEPARTMENT_OTHER): Payer: BC Managed Care – PPO | Attending: Internal Medicine | Admitting: Internal Medicine

## 2020-12-10 ENCOUNTER — Other Ambulatory Visit: Payer: Self-pay

## 2020-12-10 DIAGNOSIS — L89623 Pressure ulcer of left heel, stage 3: Secondary | ICD-10-CM | POA: Diagnosis not present

## 2020-12-10 DIAGNOSIS — L97512 Non-pressure chronic ulcer of other part of right foot with fat layer exposed: Secondary | ICD-10-CM | POA: Insufficient documentation

## 2020-12-10 DIAGNOSIS — L89893 Pressure ulcer of other site, stage 3: Secondary | ICD-10-CM | POA: Insufficient documentation

## 2020-12-10 DIAGNOSIS — Z8616 Personal history of COVID-19: Secondary | ICD-10-CM | POA: Insufficient documentation

## 2020-12-10 DIAGNOSIS — L89613 Pressure ulcer of right heel, stage 3: Secondary | ICD-10-CM | POA: Diagnosis not present

## 2020-12-10 DIAGNOSIS — L97522 Non-pressure chronic ulcer of other 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: Secondary | ICD-10-CM | POA: Insufficient documentation

## 2020-12-10 DIAGNOSIS — S91309A Unspecified open wound, unspecified foot, initial encounter: Secondary | ICD-10-CM | POA: Diagnosis not present

## 2020-12-10 NOTE — Progress Notes (Signed)
[name], [name] ([id number]) Visit Report for 12/10/2020 Debridement Details Patient Name: Date of Service: [name], [place] 12/10/2020 11:15 A M Medical Record Number: [id number] Patient Account Number: [id number] Date of Birth/Sex: Treating RN: 04-15-1976 (45 y.o. [name] Primary Care Provider: [name] Other Clinician: Referring Provider: Treating Provider/Extender: [name] in Treatment: 21 Debridement Performed for Assessment: Wound #1 Right Calcaneus Performed By: Physician Ricard Dillon., MD Debridement Type: Debridement Level of Consciousness (Pre-procedure): Awake and Alert Pre-procedure Verification/Time Out Yes - 12:20 Taken: Start Time: 12:21 Pain Control: Lidocaine 4% T opical Solution T Area Debrided (L x W): otal 5.5 (cm) x 2 (cm) = 11 (cm) Tissue and other material debrided: Viable, Non-Viable, Slough, Subcutaneous, Skin: Dermis , Skin: Epidermis, Fibrin/Exudate, Slough Level: Skin/Subcutaneous Tissue Debridement Description: Excisional Instrument: Curette Bleeding: Minimum Hemostasis Achieved: Pressure End Time: 12:25 Procedural Pain: 0 Post Procedural Pain: 0 Response to Treatment: Procedure was tolerated well Level of Consciousness (Post- Awake and Alert procedure): Post Debridement Measurements of Total Wound Length: (cm) 5.5 Stage: Category/Stage III Width: (cm) 2 Depth: (cm) 0.3 Volume: (cm) 2.592 Character of Wound/Ulcer Post Debridement: Improved Post Procedure Diagnosis Same as Pre-procedure Electronic Signature(s) Signed: 12/10/2020 5:06:47 PM By: Linton Ham MD Signed: 12/10/2020 6:21:36 PM By: [name] Entered By: Linton Ham on 12/10/2020 12:28:32 -------------------------------------------------------------------------------- Debridement Details Patient Name: Date of Service: [name], [name] 12/10/2020 11:15 A M Medical Record Number: [id number] Patient Account Number: [id number] Date  of Birth/Sex: Treating RN: [birth year] (45 y.o. [name], [name] Primary Care Provider: [name] Other Clinician: Referring Provider: Treating Provider/Extender: [name] in Treatment: 21 Debridement Performed for Assessment: Wound #2 Left Calcaneus Performed By: Physician Ricard Dillon., MD Debridement Type: Debridement Level of Consciousness (Pre-procedure): Awake and Alert Pre-procedure Verification/Time Out Yes - 12:20 Taken: Start Time: 12:21 Pain Control: Lidocaine 4% T opical Solution T Area Debrided (L x W): otal 5 (cm) x 2 (cm) = 10 (cm) Tissue and other material debrided: Viable, Non-Viable, Slough, Subcutaneous, Skin: Dermis , Skin: Epidermis, Fibrin/Exudate, Slough Level: Skin/Subcutaneous Tissue Debridement Description: Excisional Instrument: Curette Bleeding: Minimum Hemostasis Achieved: Pressure End Time: 12:25 Procedural Pain: 0 Post Procedural Pain: 0 Response to Treatment: Procedure was tolerated well Level of Consciousness (Post- Awake and Alert procedure): Post Debridement Measurements of Total Wound Length: (cm) 5 Stage: Category/Stage III Width: (cm) 2 Depth: (cm) 0.3 Volume: (cm) 2.356 Character of Wound/Ulcer Post Debridement: Improved Post Procedure Diagnosis Same as Pre-procedure Electronic Signature(s) Signed: 12/10/2020 5:06:47 PM By: Linton Ham MD Signed: 12/10/2020 6:21:36 PM By: [name] Entered By: Linton Ham on 12/10/2020 12:28:40 -------------------------------------------------------------------------------- HPI Details Patient Name: Date of Service: [name], [place]. 12/10/2020 11:15 A M Medical Record Number: [id number] Patient Account Number: [id number] Date of Birth/Sex: Treating RN: 1976-03-16 (45 y.o. [name] Primary Care Provider: [name] Other Clinician: Referring Provider: Treating Provider/Extender: [name] in Treatment: 21 History of  Present Illness HPI Description: 07/15/2020 upon evaluation today patient presents for initial inspection here in our clinic concerning issues he has been having with the bottoms of his feet bilaterally. He states these actually occurred as wounds when he was hospitalized for 5 months secondary to Covid. He was apparently with tilting bed where he was in an upright position quite frequently and apparently this occurred in some way shape or form during that time. Fortunately there is no sign of active infection at this time. No fevers,  chills, nausea, vomiting, or diarrhea. With that being said he still has substantial wounds on the plantar aspects of his feet Theragen require quite a bit of work to get these to heal. He has been using Santyl currently though that is been problematic both in receiving the medication as well as actually paid for it as it is become quite expensive. Prior to the experience with Covid the patient really did not have any major medical problems other than hypertension he does have some mild generalized weakness following the Covid experience. 07/22/2020 on evaluation today patient appears to be doing okay in regard to his foot ulcers I feel like the wound beds are showing signs of better improvement that I do believe the Iodoflex is helping in this regard. With that being said he does have a lot of drainage currently and this is somewhat blue/green in nature which is consistent with Pseudomonas. I do think a culture today would be appropriate for [place] to evaluate and see if that is indeed the case I would likely start him on antibiotic orally as well he is not allergic to Cipro knows of no issues he has had in the past 12/21; patient was admitted to the clinic earlier this month with bilateral presumed pressure ulcers on the bottom of his feet apparently related to excessive pressure from a tilt table arrangement in the intensive care unit. Patient relates this to being on ECMO  but I am not really sure that is exactly related to that. I must say I have never seen anything like this. He has fairly extensive full-thickness wounds extending from his heel towards his midfoot mostly centered laterally. There is already been some healing distally. He does not appear to have an arterial issue. He has been using gentamicin to the wound surfaces with Iodoflex to help with ongoing debridement 1/6; this is a patient with pressure ulcers on the bottom of his feet related to excessive pressure from a standing position in the intensive care unit. He is complaining of a lot of pain in the right heel. He is not a diabetic. 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 We have been using Iodoflex to help prepare the surfaces of both wounds for an advanced treatment product. He is nonambulatory spending most of his time in a wheelchair I have asked him not to propel the wheelchair with his heels 1/13; in general his wounds look better not much surface area change we have been using Iodoflex as of last week. I did an x-ray of the right heel as the patient was complaining of pain. I had some thoughts about a stress fracture perhaps Achilles tendon problems however what it showed was erosive changes along the inferior aspect of the calcaneus he now has a MRI booked for 1/20. 1/20; in general his wounds continue to be better. Some improvement in the large narrow areas proximally in his foot. He is still complaining of pain in the right heel and tenderness in certain areas of this wound. His MRI is tonight. I am not just looking for osteomyelitis that was brought up on the x-ray I am wondering about stress fractures, tendon ruptures etc. He has no such findings on the left. Also noteworthy is that the patient had critical illness neuropathy and some of the discomfort may be actual improvement in nerve function I am just not sure. These wounds were initially in the setting of  severe critical illness related to COVID-19. He was put in  a standing position. He may have also been on pressors at the point contributing to tissue ischemia. By his description at some point these wounds were grossly necrotic extending proximally up into the Achilles part of his heel. I do not know that I have ever really seen pictures of them like this although they may exist in epic We have ordered Tri layer Oasis. I am trying to stimulate some granulation in these areas. This is of course assuming the MRI is negative for infection 1/27; since the patient was last here he saw Dr. Juleen China of infectious disease. He is planned for vancomycin and ceftriaxone. Prior operative culture grew MSSA. Also ordered baseline lab work. He also ordered arterial studies although the ABIs in our clinic were normal as well as his clinical exam these were normal I do not think he needs to see vascular surgery. His ABIs at the [facility] were 1.22 in the right triphasic waveforms with a normal TBI of 1.15 on the left ABI of 1.22 with triphasic waveforms and a normal TBI of 1.08. Finally he saw Dr. Amalia Hailey who will follow him in for 2 months. At this point I do not think he felt that he needed a procedure on the right calcaneal bone. Dr. Juleen China is elected for broad-spectrum antibiotic The patient is still having pain in the right heel. He walks with a walker 2/3; wounds are generally smaller. He is tolerating his IV antibiotics. I believe this is vancomycin and ceftriaxone. We are still waiting for Oasis burn in terms of his out-of-pocket max which he should be meeting soon given the IV antibiotics, MRIs etc. I have asked him to check in on this. We are using silver collagen in the meantime the wounds look better 2/10; tolerating IV vancomycin and Rocephin. We are waiting to apply for Oasis. Although I am not really sure where he is in his out-of-pocket max. 2/17 started the first application of Oasis trilayer. Still on  antibiotics. The wounds have generally look better. The area on the left has a little more surface slough requiring debridement 3/15; second application of Oasis trilayer. The wound surface granulation is generally look better. The area on the left with undermining laterally I think is come in a bit. 10/08/2020 upon evaluation today patient is here today for [employer] application #3. Fortunately he seems to be doing extremely well with regard to this and we are seeing a lot of new epithelial growth which is great news. Fortunately there is no signs of active infection at this time. 10/16/2020 upon evaluation today patient appears to be doing well with regard to his foot ulcers. Do believe the Oasis has been of benefit for him. I do not see any signs of infection right now which is great news and I think that he has a lot of new epithelial growth which is great to see as well. The patient is very pleased to hear all of this. I do think we can proceed with the Oasis trilayer #4 today. 3/18; not as much improvement in these areas on his heels that I was hoping. I did reapply trilateral Oasis today the tissue looks healthier but not as much fill in as I was hoping. 3/25; better looking today I think this is come in a bit the tissue looks healthier. Triple layer Oasis reapplied #6 4/1; somewhat better looking definitely better looking surface not as much change in surface area as I was hoping. He may be spending more time [place] on  days then he needs to although he does have heel offloading boots. Triple layer Oasis reapplied #7 4/7; unfortunately apparently [facility] will not approve any further Oasis which is unfortunate since the patient did respond nicely both in terms of the condition of the wound bed as well as surface area. There is still some drainage coming from the wound but not a lot there does not appear to be any infection 4/15; we have been using Hydrofera Blue. He continues  to have nice rims of epithelialization on the right greater than the left. The left the epithelialization is coming from the tip of his heel. There is moderate drainage. In this that concerns me about a total contact cast. There is no evidence of infection 4/29; patient has been using Hydrofera Blue with dressing changes. He has no complaints or issues today. 5/5; using Hydrofera Blue. I actually think that he looks marginally better than the last time I saw this 3 weeks ago. There are rims of epithelialization on the left thumb coming from the medial side on the right. Using [facility] Electronic Signature(s) Signed: 12/10/2020 5:06:47 PM By: Linton Ham MD Entered By: Linton Ham on 12/10/2020 12:29:28 -------------------------------------------------------------------------------- Physical Exam Details Patient Name: Date of Service: [name], [place] 12/10/2020 11:15 A M Medical Record Number: [id number] Patient Account Number: [id number] Date of Birth/Sex: Treating RN: 19-Jun-1976 (45 y.o. [name] Primary Care Provider: [name] Other Clinician: Referring Provider: Treating Provider/Extender: [name] in Treatment: 21 Constitutional Patient is hypertensive.. Pulse regular and within target range for patient.Marland Kitchen Respirations regular, non-labored and within target range.. Temperature is normal and within the target range for the patient.Marland Kitchen Appears in no distress. Notes Wound exam; left and right plantar foot wound. There is granulation and epithelialization. Light debridement with a #5 curette subcutaneous debris Electronic Signature(s) Signed: 12/10/2020 5:06:47 PM By: Linton Ham MD Signed: 12/10/2020 5:06:47 PM By: Linton Ham MD Entered By: Linton Ham on 12/10/2020 13:03:02 -------------------------------------------------------------------------------- Physician Orders Details Patient Name: Date of Service: [name], [place]  12/10/2020 11:15 A M Medical Record Number: [id number] Patient Account Number: [id number] Date of Birth/Sex: Treating RN: May 20, 1976 (45 y.o. 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 - with Dr. Dellia Nims Cast next week left foot. Return A Bathing/ Shower/ Hygiene May shower with protection but do not get wound dressing(s) wet. Edema Control - Lymphedema / SCD / Other Bilateral Lower Extremities Elevate legs to the level of the heart or above for 30 minutes daily and/or when sitting, a frequency of: Avoid standing for long periods of time. Off-Loading Wedge shoe to: - Gloped Shoe to bilateral feet Other: - keep pressure off of the bottom of your feet Additional Orders / Instructions Follow Nutritious Diet Wound Treatment Wound #1 - Calcaneus Wound Laterality: Right Cleanser: Normal Saline (Generic) Every Other Day/15 Days Discharge Instructions: Cleanse the wound with Normal Saline prior to applying a clean dressing using gauze sponges, not tissue or cotton balls. Cleanser: Wound Cleanser Every Other Day/15 Days Discharge Instructions:  Cleanse the wound with wound cleanser prior to applying a clean dressing using gauze sponges, not tissue or cotton balls. Prim Dressing: Hydrofera Blue Classic Foam, 4x4 in (Dispense As Written) Every Other Day/15 Days ary Discharge Instructions: Moisten with saline prior to applying to wound bed Secondary Dressing: Woven  Gauze Sponge, Non-Sterile 4x4 in (Generic) Every Other Day/15 Days Discharge Instructions: Apply over primary dressing as directed. Secondary Dressing: ABD Pad, 8x10 (Generic) Every Other Day/15 Days Discharge Instructions: Apply over primary dressing as directed. Secondary Dressing: Zetuvit Plus 4x8 in (DME) (Generic) Every Other Day/15 Days Discharge Instructions: Apply over primary dressing as directed. Secured With: Elastic Bandage 4 inch (ACE bandage) Every Other Day/15 Days Discharge Instructions: Secure with ACE bandage as directed. Secured With: [employer], 4.5x3.1 (in/yd) (Generic) Every Other Day/15 Days Discharge Instructions: Secure with Kerlix as directed. Wound #2 - Calcaneus Wound Laterality: Left Cleanser: Normal Saline (Generic) Every Other Day/15 Days Discharge Instructions: Cleanse the wound with Normal Saline prior to applying a clean dressing using gauze sponges, not tissue or cotton balls. Cleanser: Wound Cleanser Every Other Day/15 Days Discharge Instructions: Cleanse the wound with wound cleanser prior to applying a clean dressing using gauze sponges, not tissue or cotton balls. Prim Dressing: Hydrofera Blue Classic Foam, 4x4 in (Dispense As Written) Every Other Day/15 Days ary Discharge Instructions: Moisten with saline prior to applying to wound bed Secondary Dressing: Woven Gauze Sponge, Non-Sterile 4x4 in (Generic) Every Other Day/15 Days Discharge Instructions: Apply over primary dressing as directed. Secondary Dressing: ABD Pad, 8x10 (Generic) Every Other Day/15 Days Discharge Instructions: Apply over primary dressing as directed. Secondary Dressing: Zetuvit Plus 4x8 in (DME) (Generic) Every Other Day/15 Days Discharge Instructions: Apply over primary dressing as directed. Secured With: Elastic Bandage 4 inch (ACE bandage) Every Other Day/15 Days Discharge Instructions: Secure with ACE bandage as directed. Secured With: [employer], 4.5x3.1  (in/yd) (Generic) Every Other Day/15 Days Discharge Instructions: Secure with Kerlix as directed. Electronic Signature(s) Signed: 12/10/2020 5:06:47 PM By: Linton Ham MD Signed: 12/10/2020 6:21:36 PM By: [name] Entered By: [name] on 12/10/2020 12:29:49 -------------------------------------------------------------------------------- Problem List Details Patient Name: Date of Service: [name], [place] 12/10/2020 11:15 A M Medical Record Number: [id number] Patient Account Number: [id number] Date of Birth/Sex: Treating RN: 10-17-1975 (45 y.o. [name] Primary Care Provider: [name] Other Clinician: Referring Provider: Treating Provider/Extender: [name] in Treatment: 21 Active Problems ICD-10 Encounter Code Description Active Date MDM Diagnosis [phone] Pressure ulcer of other site, stage 3 07/15/2020 No Yes M62.81 Muscle weakness (generalized) 07/15/2020 No Yes I10 Essential (primary) hypertension 07/15/2020 No Yes L97.512 Non-pressure chronic ulcer of other part of right foot with fat layer exposed 09/03/2020 No Yes L97.522 Non-pressure chronic ulcer of other 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 09/03/2020 No Yes Inactive Problems ICD-10 Code Description Active Date Inactive Date M86.171 Other acute osteomyelitis, right ankle and foot 09/03/2020 09/03/2020 Resolved Problems Electronic Signature(s) Signed: 12/10/2020 5:06:47 PM By: Linton Ham MD Entered By: Linton Ham on 12/10/2020 12:28:15 -------------------------------------------------------------------------------- Progress Note Details Patient Name: Date of Service: [name], [place] 12/10/2020 11:15 A M Medical Record Number: [id number] Patient Account Number: [id number] Date of Birth/Sex: Treating RN: Oct 03, 1975 (45 y.o. [name] Primary Care Provider: [name] Other Clinician: Referring Provider: Treating Provider/Extender: [name] in Treatment: 21 Subjective History of Present Illness (HPI) 07/15/2020 upon evaluation today patient presents for initial inspection here in our  clinic concerning issues he has been having with the bottoms of his feet bilaterally. He states these actually occurred as wounds when he was hospitalized for 5 months secondary to Covid. He was apparently with tilting bed where he was in an upright position quite frequently and apparently this occurred in some way shape or form during that time. Fortunately there is no sign of active infection at this time. No fevers, chills, nausea, vomiting, or diarrhea. With that being said he still has substantial wounds on the plantar aspects of his feet Theragen require quite a bit of work to get these to heal. He has been using Santyl currently though that is been problematic both in receiving the medication as well as actually paid for it as it is become quite expensive. Prior to the experience with Covid the patient really did not have any major medical problems other than hypertension he does have some mild generalized weakness following the Covid experience. 07/22/2020 on evaluation today patient appears to be doing okay in regard to his foot ulcers I feel like the wound beds are showing signs of better improvement that I do believe the Iodoflex is helping in this regard. With that being said he does have a lot of drainage currently and this is somewhat blue/green in nature which is consistent with Pseudomonas. I do think a culture today would be appropriate for [place] to evaluate and see if that is indeed the case I would likely start him on antibiotic orally as well he is not allergic to Cipro knows of no issues he has had in the past 12/21; patient was admitted to the clinic earlier this month with bilateral presumed pressure ulcers on the bottom of his feet apparently related to excessive pressure from a tilt table arrangement in the intensive care  unit. Patient relates this to being on ECMO but I am not really sure that is exactly related to that. I must say I have never seen anything like this. He has fairly extensive full-thickness wounds extending from his heel towards his midfoot mostly centered laterally. There is already been some healing distally. He does not appear to have an arterial issue. He has been using gentamicin to the wound surfaces with Iodoflex to help with ongoing debridement 1/6; this is a patient with pressure ulcers on the bottom of his feet related to excessive pressure from a standing position in the intensive care unit. He is complaining of a lot of pain in the right heel. He is not a diabetic. 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 We have been using Iodoflex to help prepare the surfaces of both wounds for an advanced treatment product. He is nonambulatory spending most of his time in a wheelchair I have asked him not to propel the wheelchair with his heels 1/13; in general his wounds look better not much surface area change we have been using Iodoflex as of last week. I did an x-ray of the right heel as the patient was complaining of pain. I had some thoughts about a stress fracture perhaps Achilles tendon problems however what it showed was erosive changes along the inferior aspect of the calcaneus he now has a MRI booked for 1/20. 1/20; in general his wounds continue to be better. Some improvement in the large narrow areas proximally in his foot. He is still complaining of pain in the right heel and tenderness in certain areas of this wound. His MRI is tonight. I am not just looking  for osteomyelitis that was brought up on the x-ray I am wondering about stress fractures, tendon ruptures etc. He has no such findings on the left. Also noteworthy is that the patient had critical illness neuropathy and some of the discomfort may be actual improvement in nerve function I am just not  sure. These wounds were initially in the setting of severe critical illness related to COVID-19. He was put in a standing position. He may have also been on pressors at the point contributing to tissue ischemia. By his description at some point these wounds were grossly necrotic extending proximally up into the Achilles part of his heel. I do not know that I have ever really seen pictures of them like this although they may exist in epic We have ordered Tri layer Oasis. I am trying to stimulate some granulation in these areas. This is of course assuming the MRI is negative for infection 1/27; since the patient was last here he saw Dr. Juleen China of infectious disease. He is planned for vancomycin and ceftriaxone. Prior operative culture grew MSSA. Also ordered baseline lab work. He also ordered arterial studies although the ABIs in our clinic were normal as well as his clinical exam these were normal I do not think he needs to see vascular surgery. His ABIs at the [facility] were 1.22 in the right triphasic waveforms with a normal TBI of 1.15 on the left ABI of 1.22 with triphasic waveforms and a normal TBI of 1.08. Finally he saw Dr. Amalia Hailey who will follow him in for 2 months. At this point I do not think he felt that he needed a procedure on the right calcaneal bone. Dr. Juleen China is elected for broad-spectrum antibiotic The patient is still having pain in the right heel. He walks with a walker 2/3; wounds are generally smaller. He is tolerating his IV antibiotics. I believe this is vancomycin and ceftriaxone. We are still waiting for Oasis burn in terms of his out-of-pocket max which he should be meeting soon given the IV antibiotics, MRIs etc. I have asked him to check in on this. We are using silver collagen in the meantime the wounds look better 2/10; tolerating IV vancomycin and Rocephin. We are waiting to apply for Oasis. Although I am not really sure where he is in his out-of-pocket max. 2/17 started  the first application of Oasis trilayer. Still on antibiotics. The wounds have generally look better. The area on the left has a little more surface slough requiring debridement 1/61; second application of Oasis trilayer. The wound surface granulation is generally look better. The area on the left with undermining laterally I think is come in a bit. 10/08/2020 upon evaluation today patient is here today for [employer] application #3. Fortunately he seems to be doing extremely well with regard to this and we are seeing a lot of new epithelial growth which is great news. Fortunately there is no signs of active infection at this time. 10/16/2020 upon evaluation today patient appears to be doing well with regard to his foot ulcers. Do believe the Oasis has been of benefit for him. I do not see any signs of infection right now which is great news and I think that he has a lot of new epithelial growth which is great to see as well. The patient is very pleased to hear all of this. I do think we can proceed with the Oasis trilayer #4 today. 3/18; not as much improvement in these areas on his heels  that I was hoping. I did reapply trilateral Oasis today the tissue looks healthier but not as much fill in as I was hoping. 3/25; better looking today I think this is come in a bit the tissue looks healthier. Triple layer Oasis reapplied #6 4/1; somewhat better looking definitely better looking surface not as much change in surface area as I was hoping. He may be spending more time [place] on days then he needs to although he does have heel offloading boots. Triple layer Oasis reapplied #7 4/7; unfortunately apparently [facility] will not approve any further Oasis which is unfortunate since the patient did respond nicely both in terms of the condition of the wound bed as well as surface area. There is still some drainage coming from the wound but not a lot there does not appear to be any infection 4/15;  we have been using Hydrofera Blue. He continues to have nice rims of epithelialization on the right greater than the left. The left the epithelialization is coming from the tip of his heel. There is moderate drainage. In this that concerns me about a total contact cast. There is no evidence of infection 4/29; patient has been using Hydrofera Blue with dressing changes. He has no complaints or issues today. 5/5; using Hydrofera Blue. I actually think that he looks marginally better than the last time I saw this 3 weeks ago. There are rims of epithelialization on the left thumb coming from the medial side on the right. Using Hydrofera Blue Objective Constitutional Patient is hypertensive.. Pulse regular and within target range for patient.Marland Kitchen Respirations regular, non-labored and within target range.. Temperature is normal and within the target range for the patient.Marland Kitchen Appears in no distress. Vitals Time Taken: 11:45 AM, Height: 69 in, Weight: 280 lbs, BMI: 41.3, Temperature: 98.5 F, Pulse: 130 bpm, Respiratory Rate: 16 breaths/min, Blood Pressure: 150/80 mmHg. General Notes: Wound exam; left and right plantar foot wound. There is granulation and epithelialization. Light debridement with a #5 curette subcutaneous debris Integumentary (Hair, Skin) Wound #1 status is Open. Original cause of wound was Pressure Injury. The date acquired was: 10/07/2019. The wound has been in treatment 21 weeks. The wound is located on the Right Calcaneus. The wound measures 5.5cm length x 2cm width x 0.3cm depth; 8.639cm^2 area and 2.592cm^3 volume. There is Fat Layer (Subcutaneous Tissue) exposed. There is no tunneling or undermining noted. There is a medium amount of serosanguineous drainage noted. The wound margin is thickened. There is large (67-100%) red, pink granulation within the wound bed. There is a small (1-33%) amount of necrotic tissue within the wound bed including Adherent Slough. Wound #2 status is Open.  Original cause of wound was Pressure Injury. The date acquired was: 10/07/2019. The wound has been in treatment 21 weeks. The wound is located on the Left Calcaneus. The wound measures 5cm length x 2cm width x 0.3cm depth; 7.854cm^2 area and 2.356cm^3 volume. There is Fat Layer (Subcutaneous Tissue) exposed. There is no tunneling or undermining noted. There is a medium amount of serosanguineous drainage noted. The wound margin is thickened. There is large (67-100%) red, pink granulation within the wound bed. There is a small (1-33%) amount of necrotic tissue within the wound bed including Adherent Slough. 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 There was a Excisional Skin/Subcutaneous Tissue Debridement with a total area of 11 sq cm performed by Ricard Dillon., MD. With the following instrument(s): Curette to remove Viable and Non-Viable tissue/material. Material removed includes Subcutaneous Tissue, Slough, Skin: Dermis, Skin: Epidermis, and Fibrin/Exudate after achieving pain control using Lidocaine 4% Topical Solution. A time out was conducted at 12:20, prior to the start of the procedure. A Minimum amount of bleeding was controlled with Pressure. The procedure was tolerated well with a pain level of 0 throughout and a pain level of 0 following the procedure. Post Debridement Measurements: 5.5cm length x 2cm width x 0.3cm depth; 2.592cm^3 volume. Post debridement Stage noted as Category/Stage III. Character of Wound/Ulcer Post Debridement is improved. Post procedure Diagnosis Wound #1: Same as Pre-Procedure Wound #2 Pre-procedure  diagnosis of Wound #2 is a Pressure Ulcer located on the Left Calcaneus . There was a Excisional Skin/Subcutaneous Tissue Debridement with a total area of 10 sq cm performed by Ricard Dillon., MD. With the following instrument(s): Curette to remove Viable and Non-Viable tissue/material. Material removed includes Subcutaneous Tissue, Slough, Skin: Dermis, Skin: Epidermis, and Fibrin/Exudate after achieving pain control using Lidocaine 4% Topical Solution. A time out was conducted at 12:20, prior to the start of the procedure. A Minimum amount of bleeding was controlled with Pressure. The procedure was tolerated well with a pain level of 0 throughout and a pain level of 0 following the procedure. Post Debridement Measurements: 5cm length x 2cm width x 0.3cm depth; 2.356cm^3 volume. Post debridement Stage noted as Category/Stage III. Character of Wound/Ulcer Post Debridement is improved. Post procedure Diagnosis Wound #2: Same as Pre-Procedure Plan Follow-up Appointments: Return Appointment in 1 week. - with Dr. Dellia Nims Cast next week left foot. Bathing/ Shower/ Hygiene: May shower with protection but do not get wound dressing(s) wet. Edema Control - Lymphedema / SCD / Other: Elevate legs to the level of the heart or above for 30 minutes daily and/or when sitting, a frequency of: Avoid standing for long periods of time. Off-Loading: Wedge shoe to: - Gloped Shoe to bilateral feet Other: - keep pressure off of the bottom of your feet Additional Orders / Instructions: Follow Nutritious Diet WOUND #1: - Calcaneus Wound Laterality: Right Cleanser: Normal Saline (Generic) Every Other Day/15 Days Discharge Instructions: Cleanse the wound with Normal Saline prior to applying a clean dressing using gauze sponges, not tissue or cotton balls. Cleanser: Wound Cleanser Every Other Day/15 Days Discharge Instructions: Cleanse the wound with wound cleanser prior to applying a clean dressing using gauze  sponges, not tissue or cotton balls. Prim Dressing: Hydrofera Blue Classic Foam, 4x4 in (Dispense As Written) Every Other Day/15 Days ary Discharge Instructions: Moisten with saline prior to applying to wound bed Secondary Dressing: Woven Gauze Sponge, Non-Sterile 4x4 in (Generic) Every Other Day/15 Days Discharge Instructions: Apply over primary dressing as directed. Secondary Dressing: ABD Pad, 8x10 (Generic) Every Other Day/15 Days Discharge Instructions: Apply over primary dressing as directed. Secondary Dressing: Zetuvit Plus 4x8 in (DME) (Generic) Every Other Day/15 Days Discharge Instructions: Apply over primary dressing as directed. Secured With: Elastic Bandage 4 inch (ACE bandage) Every Other Day/15 Days Discharge Instructions: Secure with ACE bandage as directed. Secured With: [employer], 4.5x3.1 (in/yd) (Generic) Every Other Day/15 Days Discharge Instructions: Secure with Kerlix as directed. WOUND #2: - Calcaneus Wound Laterality: Left Cleanser: Normal Saline (Generic) Every Other  Day/15 Days Discharge Instructions: Cleanse the wound with Normal Saline prior to applying a clean dressing using gauze sponges, not tissue or cotton balls. Cleanser: Wound Cleanser Every Other Day/15 Days Discharge Instructions: Cleanse the wound with wound cleanser prior to applying a clean dressing using gauze sponges, not tissue or cotton balls. Prim Dressing: Hydrofera Blue Classic Foam, 4x4 in (Dispense As Written) Every Other Day/15 Days ary Discharge Instructions: Moisten with saline prior to applying to wound bed Secondary Dressing: Woven Gauze Sponge, Non-Sterile 4x4 in (Generic) Every Other Day/15 Days Discharge Instructions: Apply over primary dressing as directed. Secondary Dressing: ABD Pad, 8x10 (Generic) Every Other Day/15 Days Discharge Instructions: Apply over primary dressing as directed. Secondary Dressing: Zetuvit Plus 4x8 in (DME) (Generic) Every Other Day/15  Days Discharge Instructions: Apply over primary dressing as directed. Secured With: Elastic Bandage 4 inch (ACE bandage) Every Other Day/15 Days Discharge Instructions: Secure with ACE bandage as directed. Secured With: [employer], 4.5x3.1 (in/yd) (Generic) Every Other Day/15 Days Discharge Instructions: Secure with Kerlix as directed. 1. Still using Hydrofera Blue 2. Zetuvit #3 I will consider a total contact cast on the left leg next week if the drainage will allow. Need to check on the Electronic Signature(s) Signed: 12/10/2020 5:06:47 PM By: Linton Ham MD Entered By: Linton Ham on 12/10/2020 13:04:09 -------------------------------------------------------------------------------- SuperBill Details Patient Name: Date of Service: [name], [place] 12/10/2020 Medical Record Number: [id number] Patient Account Number: [id number] Date of Birth/Sex: Treating RN: [id number] (45 y.o. [name], [name] Primary Care Provider: [name] Other Clinician: Referring Provider: Treating Provider/Extender: [name] in Treatment: 21 Diagnosis Coding ICD-10 Codes Code Description [phone] Pressure ulcer of other site, stage 3 M62.81 Muscle weakness (generalized) I10 Essential (primary) hypertension L97.512 Non-pressure chronic ulcer of other part of right foot with fat layer exposed L97.522 Non-pressure chronic ulcer of other 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 Facility Procedures CPT4 Code: [id number] Description: [place] - DEB SUBQ TISSUE 20 SQ CM/< ICD-10 Diagnosis Description L97.512 Non-pressure chronic ulcer of other part of right foot with fat layer exposed L97.522 Non-pressure chronic ulcer of other 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 Modifier: Quantity: 1 CPT4 Code: [id number] Description: 22449 - DEB SUBQ TISS EA ADDL 20CM ICD-10 Diagnosis Description L97.522 Non-pressure chronic ulcer of other 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  L97.512 Non-pressure chronic ulcer of other part of right foot with fat layer exposed Modifier: Quantity: 1 Physician Procedures : CPT4 Code Description Modifier [id number] 10211 - WC PHYS SUBQ TISS 20 SQ CM ICD-10 Diagnosis Description L97.512 Non-pressure chronic ulcer of other part of right foot with fat layer exposed L97.522 Non-pressure chronic ulcer of other part of left foot  with fat layer exposed Quantity: 1 : [id number] 14103 - WC PHYS SUBQ TISS EA ADDL 20 CM ICD-10 Diagnosis Description L97.522 Non-pressure chronic ulcer of other 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 L97.512 Non-pressure chronic ulcer of other part of right foot with fat layer exposed Quantity: 1 Electronic Signature(s) Signed: 12/10/2020 5:06:47 PM By: Linton Ham MD Entered By: Linton Ham on 12/10/2020 13:04:24

## 2020-12-10 NOTE — Progress Notes (Signed)
ELION, HOCKER ([id number]) Visit Report for 12/10/2020 Arrival Information Details Patient Name: Date of Service: [name], [name] 12/10/2020 11:15 A M Medical Record Number: [id number] Patient Account Number: [id number] Date of Birth/Sex: Treating RN: 1975-12-28 (45 y.o. [name] Primary Care Sharyon Peitz: [name] Other Clinician: Referring Anjani Feuerborn: Treating Kalsey Lull/Extender: [name] in Treatment: 21 Visit Information History Since Last Visit Added or deleted any medications: No Patient Arrived: Wheel Chair Any new allergies or adverse reactions: No Arrival Time: 11:44 Had a fall or experienced change in No Transfer Assistance: None activities of daily living that may affect Patient Identification Verified: Yes risk of falls: Secondary Verification Process Completed: Yes Signs or symptoms of abuse/neglect since last visito No Patient Requires Transmission-Based Precautions: No Hospitalized since last visit: No Patient Has Alerts: No Implantable device outside of the clinic excluding No cellular tissue based products placed in the center since last visit: Has Footwear/Offloading in Place as Prescribed: Yes Left: Wedge Shoe Right: Wedge Shoe Pain Present Now: Yes Electronic Signature(s) Signed: 12/10/2020 5:52:28 PM By: Lorrin Jackson Entered By: Lorrin Jackson on 12/10/2020 11:48:43 -------------------------------------------------------------------------------- Encounter Discharge Information Details Patient Name: Date of Service: [name], [name] 12/10/2020 11:15 A M Medical Record Number: [id number] Patient Account Number: [id number] Date of Birth/Sex: Treating RN: 03-Feb-1976 (44 y.o. [name] Primary Care Reynol Arnone: [name] Other Clinician: Referring Almin Livingstone: Treating Taimi Towe/Extender: [name] in Treatment: 21 Encounter Discharge Information Items Post Procedure Vitals Discharge Condition:  Stable Temperature (F): 98.5 Ambulatory Status: Wheelchair Pulse (bpm): 130 Discharge Destination: Home Respiratory Rate (breaths/min): 16 Transportation: Private Auto Blood Pressure (mmHg): 150/80 Accompanied By: family member Schedule Follow-up Appointment: Yes Clinical Summary of Care: Electronic Signature(s) Signed: 12/10/2020 6:21:36 PM By: [name] Entered By: [name] on 12/10/2020 12:54:11 -------------------------------------------------------------------------------- Lower Extremity Assessment Details Patient Name: Date of Service: [name] [name] 12/10/2020 11:15 A M Medical Record Number: [id number] Patient Account Number: [id number] Date of Birth/Sex: Treating RN: [birth year] (45 y.o. [name] Primary Care Krista Som: [name] Other Clinician: Referring Neal Trulson: Treating Dewight Catino/Extender: [name] in Treatment: 21 Edema Assessment Assessed: [name]: Yes] [name]: Yes] Edema: [Left: Yes] [Right: Yes] Calf Left: Right: Point of Measurement: 29 cm From Medial Instep 43 cm 43 cm Ankle Left: Right: Point of Measurement: 9 cm From Medial Instep 24 cm 25 cm Vascular Assessment Pulses: Dorsalis Pedis Palpable: [Left:Yes] [Right:Yes] Electronic Signature(s) Signed: 12/10/2020 5:52:28 PM By: Lorrin Jackson Entered By: Lorrin Jackson on 12/10/2020 12:00:52 -------------------------------------------------------------------------------- Multi Wound Chart Details Patient Name: Date of Service: [name], [name] [place] E. 12/10/2020 11:15 A M Medical Record Number: [id number] Patient Account Number: [id number] Date of Birth/Sex: Treating RN: 06/20/1976 (45 y.o. [name], [name] Primary Care Cap Massi: [name] Other Clinician: Referring Charels Stambaugh: Treating Aimy Sweeting/Extender: [name] in Treatment: 21 Vital Signs Height(in): 69 Pulse(bpm): 130 Weight(lbs): 280 Blood Pressure(mmHg): 150/80 Body Mass  Index(BMI): 41 Temperature(F): 98.5 Respiratory Rate(breaths/min): 16 Photos: [1:No Photos Right Calcaneus] [2:No Photos Left Calcaneus] [N/A:N/A N/A] Wound Location: [1:Pressure Injury] [2:Pressure Injury] [N/A:N/A] Wounding Event: [1:Pressure Ulcer] [2:Pressure Ulcer] [N/A:N/A] Primary Etiology: [1:Asthma, Angina, Hypertension] [2:Asthma, Angina, Hypertension] [N/A:N/A] Comorbid History: [1:10/07/2019] [2:10/07/2019] [N/A:N/A] Date Acquired: [1:21] [2:21] [N/A:N/A] Weeks of Treatment: [1:Open] [2:Open] [N/A:N/A] Wound Status: [1:5.5x2x0.3] [2:5x2x0.3] [N/A:N/A] Measurements L x W x D (cm) [1:8.639] [3:3.007] [N/A:N/A] A (cm) : rea [1:2.592] [2:2.356] [N/A:N/A] Volume (cm) : [1:72.70%] [2:71.00%] [N/A:N/A] % Reduction in A rea: [1:91.80%] [2:91.30%] [N/A:N/A] %  Reduction in Volume: [1:Category/Stage III] [2:Category/Stage III] [N/A:N/A] Classification: [1:Medium] [2:Medium] [N/A:N/A] Exudate A [name]: [1:Serosanguineous] [2:Serosanguineous] [N/A:N/A] Exudate Type: [1:red, brown] [2:red, brown] [N/A:N/A] Exudate Color: [1:Thickened] [2:Thickened] [N/A:N/A] Wound Margin: [1:Large (67-100%)] [2:Large (67-100%)] [N/A:N/A] Granulation A [name]: [1:Red, Pink] [2:Red, Pink] [N/A:N/A] Granulation Quality: [1:Small (1-33%)] [2:Small (1-33%)] [N/A:N/A] Necrotic A [name]: [1:Fat Layer (Subcutaneous Tissue): Yes Fat Layer (Subcutaneous Tissue): Yes N/A] Exposed Structures: [1:Fascia: No Tendon: No Muscle: No Joint: No Bone: No Medium (34-66%)] [2:Fascia: No Tendon: No Muscle: No Joint: No Bone: No Small (1-33%)] [N/A:N/A] Epithelialization: [1:Debridement - Excisional] [2:Debridement - Excisional] [N/A:N/A] Debridement: Pre-procedure Verification/Time Out 12:20 [2:12:20] [N/A:N/A] Taken: [1:Lidocaine 4% Topical Solution] [2:Lidocaine 4% Topical Solution] [N/A:N/A] Pain Control: [1:Subcutaneous, Slough] [2:Subcutaneous, Slough] [N/A:N/A] Tissue Debrided: [1:Skin/Subcutaneous Tissue]  [2:Skin/Subcutaneous Tissue] [N/A:N/A] Level: [1:11] [2:10] [N/A:N/A] Debridement A (sq cm): [1:rea Curette] [2:Curette] [N/A:N/A] Instrument: [1:Minimum] [2:Minimum] [N/A:N/A] Bleeding: [1:Pressure] [2:Pressure] [N/A:N/A] Hemostasis A chieved: [1:0] [2:0] [N/A:N/A] Procedural Pain: [1:0] [2:0] [N/A:N/A] Post Procedural Pain: [1:Procedure was tolerated well] [2:Procedure was tolerated well] [N/A:N/A] Debridement Treatment Response: [1:5.5x2x0.3] [2:5x2x0.3] [N/A:N/A] Post Debridement Measurements L x W x D (cm) [1:2.592] [2:2.356] [N/A:N/A] Post Debridement Volume: (cm) [1:Category/Stage III] [2:Category/Stage III] [N/A:N/A] Post Debridement Stage: [1:Debridement] [2:Debridement] [N/A:N/A] Treatment Notes Electronic Signature(s) Signed: 12/10/2020 5:06:47 PM By: Linton Ham MD Signed: 12/10/2020 6:21:36 PM By: [name] Entered By: Linton Ham on 12/10/2020 12:28:20 -------------------------------------------------------------------------------- Multi-Disciplinary Care Plan Details Patient Name: Date of Service: [name], [name] [place] E. 12/10/2020 11:15 A M Medical Record Number: [id number] Patient Account Number: [id number] Date of Birth/Sex: Treating RN: 01-07-1976 (45 y.o. [name] Primary Care Laquan Beier: [name] Other Clinician: Referring Larkyn Greenberger: Treating Nahara Dona/Extender: [name] in Treatment: 21 Multidisciplinary Care Plan reviewed with physician Active Inactive Abuse / Safety / Falls / Self Care Management Nursing Diagnoses: Potential for falls Goals: Patient/caregiver will verbalize/demonstrate measures taken [name] prevent injury and/or falls Date Initiated: 07/15/2020 Target Resolution Date: 02/05/2021 Goal Status: Active Interventions: Assess fall risk on admission and as needed Assess impairment of mobility on admission and as needed per policy Notes: Electronic Signature(s) Signed: 12/10/2020 6:21:36 PM By: [name] Entered By: [name] on 12/10/2020 11:43:49 -------------------------------------------------------------------------------- Pain Assessment Details Patient Name: Date of Service: [name], [place]. 12/10/2020 11:15 A M Medical Record Number: [id number] Patient Account Number: [id number] Date of Birth/Sex: Treating RN: 1975-09-28 (45 y.o. [name] Primary Care Sirius Woodford: [name] Other Clinician: Referring Dilan Novosad: Treating Akiah Bauch/Extender: [name] in Treatment: 21 Active Problems Location of Pain Severity and Description of Pain Patient Has Paino Yes Site Locations Pain Location: Pain in Ulcers With Dressing Change: Yes Duration of the Pain. Constant / Intermittento Intermittent Rate the pain. Current Pain Level: 4 Character of Pain Describe the Pain: Tender, Throbbing Pain Management and Medication Current Pain Management: Medication: Yes Cold Application: No Rest: Yes Massage: No Activity: No T.MckenzieN.S.: No Heat Application: No Leg drop or elevation: No Is the Current Pain Management Adequate: Inadequate How does your wound impact your activities of daily livingo Sleep: No Bathing: No Appetite: No Relationship With Others: No Bladder Continence: No Emotions: No Bowel Continence: No Work: No Toileting: No Drive: No Dressing: No Hobbies: No Electronic Signature(s) Signed: 12/10/2020 5:52:28 PM By: Lorrin Jackson Entered By: Lorrin Jackson on 12/10/2020 11:49:38 -------------------------------------------------------------------------------- Patient/Caregiver Education Details Patient Name: Date of Service: GO URLEY, [name] [place] E. 5/5/2022andnbsp11:15 A M Medical Record Number: [id number] Patient Account Number: [id number] Date of Birth/Gender: Treating RN: [birth year] (45 y.o. M)  [name] Primary Care Physician: [name] Other Clinician: Referring Physician: Treating Physician/Extender: [name] in Treatment: 21 Education Assessment Education Provided [name]: Patient Education Topics Provided Wound/Skin Impairment: Handouts: Skin Care Do's and Dont's Methods: Explain/Verbal Responses: Reinforcements needed Electronic Signature(s) Signed: 12/10/2020 6:21:36 PM By: [name] Entered By: [name] on 12/10/2020 11:44:01 -------------------------------------------------------------------------------- Wound Assessment Details Patient Name: Date of Service: [name], [place] 12/10/2020 11:15 A M Medical Record Number: [id number] Patient Account Number: [id number] Date of Birth/Sex: Treating RN: October 17, 1975 (45 y.o. [name] Primary Care Jimmie Dattilio: [name] Other Clinician: Referring Delynda Sepulveda: Treating Shantel Wesely/Extender: [name] in Treatment: 21 Wound Status Wound Number: 1 Primary Etiology: Pressure Ulcer Wound Location: Right Calcaneus Wound Status: Open Wounding Event: Pressure Injury Comorbid History: Asthma, Angina, Hypertension Date Acquired: 10/07/2019 Weeks Of Treatment: 21 Clustered Wound: No Photos Wound Measurements Length: (cm) 5.5 Width: (cm) 2 Depth: (cm) 0.3 Area: (cm) 8.639 Volume: (cm) 2.592 % Reduction in Area: 72.7% % Reduction in Volume: 91.8% Epithelialization: Medium (34-66%) Tunneling: No Undermining: No Wound Description Classification: Category/Stage III Wound Margin: Thickened Exudate Amount: Medium Exudate Type: Serosanguineous Exudate Color: red, brown Foul Odor After Cleansing: No Slough/Fibrino Yes Wound Bed Granulation Amount: Large (67-100%) Exposed Structure Granulation Quality: Red, Pink Fascia Exposed: No Necrotic Amount: Small (1-33%) Fat Layer (Subcutaneous Tissue) Exposed: Yes Necrotic Quality: Adherent Slough Tendon Exposed: No Muscle Exposed: No Joint Exposed: No Bone Exposed: No Treatment Notes Wound #1 (Calcaneus) Wound Laterality:  Right Cleanser Wound Cleanser Discharge Instruction: Cleanse the wound with wound cleanser prior [name] applying a clean dressing using gauze sponges, not tissue or cotton balls. Normal Saline Discharge Instruction: Cleanse the wound with Normal Saline prior [name] applying a clean dressing using gauze sponges, not tissue or cotton balls. Peri-Wound Care Topical Primary Dressing Hydrofera Blue Classic Foam, 4x4 in Discharge Instruction: Moisten with saline prior [name] applying [name] wound bed Secondary Dressing Woven Gauze Sponge, Non-Sterile 4x4 in Discharge Instruction: Apply over primary dressing as directed. Zetuvit Plus 4x8 in Discharge Instruction: Apply over primary dressing as directed. ABD Pad, 8x10 Discharge Instruction: Apply over primary dressing as directed. Secured With Elastic Bandage 4 inch (ACE bandage) Discharge Instruction: Secure with ACE bandage as directed. Kerlix Roll Sterile, 4.5x3.1 (in/yd) Discharge Instruction: Secure with Kerlix as directed. Compression Wrap Compression Stockings Add-Ons Electronic Signature(s) Signed: 12/10/2020 5:12:45 PM By: Sandre Kitty Signed: 12/10/2020 5:52:28 PM By: Lorrin Jackson Entered By: Sandre Kitty on 12/10/2020 16:37:50 -------------------------------------------------------------------------------- Wound Assessment Details Patient Name: Date of Service: [name], [place] 12/10/2020 11:15 A M Medical Record Number: [id number] Patient Account Number: [id number] Date of Birth/Sex: Treating RN: [birth year] (45 y.o. [name] Primary Care Chaim Gatley: [name] Other Clinician: Referring Lawerence Dery: Treating Talita Recht/Extender: [name] in Treatment: 21 Wound Status Wound Number: 2 Primary Etiology: Pressure Ulcer Wound Location: Left Calcaneus Wound Status: Open Wounding Event: Pressure Injury Comorbid History: Asthma, Angina, Hypertension Date Acquired: 10/07/2019 Weeks Of Treatment:  21 Clustered Wound: No Photos Wound Measurements Length: (cm) 5 Width: (cm) 2 Depth: (cm) 0.3 Area: (cm) 7.854 Volume: (cm) 2.356 % Reduction in Area: 71% % Reduction in Volume: 91.3% Epithelialization: Small (1-33%) Tunneling: No Undermining: No Wound Description Classification: Category/Stage III Wound Margin: Thickened Exudate Amount: Medium Exudate Type: Serosanguineous Exudate Color: red, brown Foul Odor After Cleansing: No Slough/Fibrino Yes Wound Bed Granulation Amount: Large (67-100%) Exposed Structure Granulation Quality: Red, Pink Fascia Exposed: No Necrotic Amount: Small (1-33%) Fat Layer (  Subcutaneous Tissue) Exposed: Yes Necrotic Quality: Adherent Slough Tendon Exposed: No Muscle Exposed: No Joint Exposed: No Bone Exposed: No Treatment Notes Wound #2 (Calcaneus) Wound Laterality: Left Cleanser Wound Cleanser Discharge Instruction: Cleanse the wound with wound cleanser prior [name] applying a clean dressing using gauze sponges, not tissue or cotton balls. Normal Saline Discharge Instruction: Cleanse the wound with Normal Saline prior [name] applying a clean dressing using gauze sponges, not tissue or cotton balls. Peri-Wound Care Topical Primary Dressing Hydrofera Blue Classic Foam, 4x4 in Discharge Instruction: Moisten with saline prior [name] applying [name] wound bed Secondary Dressing Woven Gauze Sponge, Non-Sterile 4x4 in Discharge Instruction: Apply over primary dressing as directed. Zetuvit Plus 4x8 in Discharge Instruction: Apply over primary dressing as directed. ABD Pad, 8x10 Discharge Instruction: Apply over primary dressing as directed. Secured With Elastic Bandage 4 inch (ACE bandage) Discharge Instruction: Secure with ACE bandage as directed. Kerlix Roll Sterile, 4.5x3.1 (in/yd) Discharge Instruction: Secure with Kerlix as directed. Compression Wrap Compression Stockings Add-Ons Electronic Signature(s) Signed: 12/10/2020 5:12:45 PM By: Sandre Kitty Signed: 12/10/2020 5:52:28 PM By: Lorrin Jackson Entered By: Sandre Kitty on 12/10/2020 16:38:07 -------------------------------------------------------------------------------- Vitals Details Patient Name: Date of Service: [name], [place] 12/10/2020 11:15 A M Medical Record Number: [id number] Patient Account Number: [id number] Date of Birth/Sex: Treating RN: 1976-04-11 (45 y.o. [name] Primary Care Dameion Briles: [name] Other Clinician: Referring Luian Schumpert: Treating Daziyah Cogan/Extender: [name] in Treatment: 21 Vital Signs Time Taken: 11:45 Temperature (F): 98.5 Height (in): 69 Pulse (bpm): 130 Weight (lbs): 280 Respiratory Rate (breaths/min): 16 Body Mass Index (BMI): 41.3 Blood Pressure (mmHg): 150/80 Reference Range: 80 - 120 mg / dl Electronic Signature(s) Signed: 12/10/2020 5:52:28 PM By: Lorrin Jackson Entered By: Lorrin Jackson on 12/10/2020 11:49:08

## 2020-12-11 DIAGNOSIS — R7309 Other abnormal glucose: Secondary | ICD-10-CM | POA: Diagnosis not present

## 2020-12-11 DIAGNOSIS — I10 Essential (primary) hypertension: Secondary | ICD-10-CM | POA: Diagnosis not present

## 2020-12-15 ENCOUNTER — Encounter: Payer: Self-pay | Admitting: Allergy & Immunology

## 2020-12-15 ENCOUNTER — Other Ambulatory Visit: Payer: Self-pay

## 2020-12-15 ENCOUNTER — Ambulatory Visit: Payer: BC Managed Care – PPO | Admitting: Allergy and Immunology

## 2020-12-15 ENCOUNTER — Ambulatory Visit (INDEPENDENT_AMBULATORY_CARE_PROVIDER_SITE_OTHER): Payer: BC Managed Care – PPO | Admitting: Allergy & Immunology

## 2020-12-15 ENCOUNTER — Ambulatory Visit: Payer: BC Managed Care – PPO | Admitting: Allergy & Immunology

## 2020-12-15 VITALS — BP 148/90 | HR 97 | Temp 99.8°F | Resp 16 | Ht 68.5 in | Wt 304.0 lb

## 2020-12-15 DIAGNOSIS — J4540 Moderate persistent asthma, uncomplicated: Secondary | ICD-10-CM

## 2020-12-15 DIAGNOSIS — J3089 Other allergic rhinitis: Secondary | ICD-10-CM | POA: Diagnosis not present

## 2020-12-15 MED ORDER — ALBUTEROL SULFATE HFA 108 (90 BASE) MCG/ACT IN AERS: 2.0000 | INHALATION_SPRAY | RESPIRATORY_TRACT | 1 refills | Status: DC | PRN

## 2020-12-15 MED ORDER — IPRATROPIUM BROMIDE 0.03 % NA SOLN: 2.0000 | Freq: Three times a day (TID) | NASAL | 5 refills | Status: DC

## 2020-12-15 MED ORDER — BUDESONIDE-FORMOTEROL FUMARATE 160-4.5 MCG/ACT IN AERO: 2.0000 | INHALATION_SPRAY | Freq: Two times a day (BID) | RESPIRATORY_TRACT | 5 refills | Status: DC

## 2020-12-15 NOTE — Patient Instructions (Addendum)
1. Moderate persistent asthma, uncomplicated - Lung testing looked stable today.  - We will continue with Symbicort for now.  - Call the home medical supply to check on traveling with oxygen.  - Daily controller medication(s): Symbicort 160/4.56mg two puffs twice daily with spacer - Prior to physical activity: albuterol 2 puffs 10-15 minutes before physical activity. - Rescue medications: albuterol 4 puffs every 4-6 hours as needed - Asthma control goals:  * Full participation in all desired activities (may need albuterol before activity) * Albuterol use two time or less a week on average (not counting use with activity) * Cough interfering with sleep two time or less a month * Oral steroids no more than once a year * No hospitalizations  2. Allergic rhinitis - with increased rhinorrhea - Continue with Nasacort one spray per nostril daily. - Add on Atrovent nasal spray one spray per nostril every 8 hours as needed for runny nose.  - Continue with cetirizine 163mdaily.  3. Return in about 6 months (around 06/17/2021).    Please inform us[facility]ore[facility] any Emergency Department visits, hospitalizations, or changes in symptoms. Call usKoreaefore going to the ED for breathing or allergy symptoms since we might be able to fit you in for a sick visit. Feel free to contact usKoreanytime with any questions, problems, or concerns.  It was a pleasure to see you and your family again today!  Websites that have reliable patient information: 1. American Academy of Asthma, Allergy, and Immunology: www.aaaai.org 2. Food Allergy Research and Education (FARE): foodallergy.org 3. Mothers of Asthmatics: http://www.asthmacommunitynetwork.org 4. American College of Allergy, Asthma, and Immunology: www.acaai.org   COVID-19 Vaccine Information can be found at: [email or web address] questions related to vaccine distribution or appointments, please email  [email or web address] or call [phone]  We realize that you might be concerned about having an allergic reaction to the COVID19 vaccines. To help with that concern, WE ARE OFFERING THE COVID19 VACCINES IN OUR OFFICE! Ask the front desk for dates!     "Like" us[facility] Facebook and Instagram for our latest updates!      A healthy democracy works best when ALNew York Life Insurancearticipate! Make sure you are registered to vote! If you have moved or changed any of your contact information, you will need to get this updated before voting!  In some cases, you MAY be able to register to vote online: [email or web address]

## 2020-12-15 NOTE — Progress Notes (Signed)
FOLLOW UP  Date of Service/Encounter:  12/15/20   Assessment:   Moderate persistent asthma, uncomplicated  Allergic rhinitis  Prolonged hospitalization for COVID19 requiring ECMO  Fully vaccinated against COVID19  Plan/Recommendations:   1. Moderate persistent asthma, uncomplicated - Lung testing looked stable today.  - We will continue with Symbicort for now.  - Call the home medical supply to check on traveling with oxygen.  - Daily controller medication(s): Symbicort 160/4.10mg two puffs twice daily with spacer - Prior to physical activity: albuterol 2 puffs 10-15 minutes before physical activity. - Rescue medications: albuterol 4 puffs every 4-6 hours as needed - Asthma control goals:  * Full participation in all desired activities (may need albuterol before activity) * Albuterol use two time or less a week on average (not counting use with activity) * Cough interfering with sleep two time or less a month * Oral steroids no more than once a year * No hospitalizations  2. Allergic rhinitis - with increased rhinorrhea - Continue with Nasacort one spray per nostril daily. - Add on Atrovent nasal spray one spray per nostril every 8 hours as needed for runny nose.  - Continue with cetirizine 173mdaily.  3. Return in about 6 months (around 06/17/2021).   Subjective:   [name] a [age].o. male presenting today for follow up of  Chief Complaint  Patient presents with  . Asthma    Has covid couple of years ago still having some difficulty due to it. Has SOB on/off and sometimes wakes up with coughing wheezing SOB     [name] a history of the following: Patient Active Problem List   Diagnosis Date Noted  . Encounter for therapeutic drug monitoring 09/29/2020  . COVID-19 long hauler manifesting chronic dyspnea 09/15/2020  . Morbid obesity with body mass index of 40.0-44.9 in adult ([facility]/03/2021  . Encounter for attention to tracheostomy  ([facility]/03/2021  . Critical illness polyneuropathy (HCC[facility]02/03/2021  . Moderate protein-calorie malnutrition ([facility]02/03/2021  . Pyogenic inflammation of bone (HCM[facility]02/09/2020  . Long COVID 09/09/2020  . Acute osteomyelitis ([facility]/24/2022  . MSSA (methicillin susceptible Staphylococcus aureus) infection 08/31/2020  . Reactive depression 07/01/2020  . 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 Impaired sensation to light touch 04/15/2020  . Diffuse alveolar damage ([facility]08/09/2019  . Eschar of foot   . 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  . History of COVID-19   . Pressure injury of skin 08/22/2019  . Need for emotional support   . Chest tube in place   . Personal history of ECMO   . Advanced care planning/counseling discussion   . Palliative care by specialist   . Goals of care, counseling/discussion   . Advanced directives, counseling/discussion   . Subcutaneous crepitus   . Thrombocytopenia ([facility]  . Leukopenia   . Fever   . Gram positive bacterial infection   . Septic arthritis of right acromioclavicular joint (HCOrr[facility]02/22/2019  . Chronic left-sided low back pain with left-sided sciatica 09/19/2017  . Epigastric pain   . Essential hypertension 04/17/2017  . Moderate persistent asthma 07/21/2015  . Allergic rhinoconjunctivitis 07/21/2015  . GERD (gastroesophageal reflux disease) 07/21/2015  . Abnormal gait 11/12/2009  . TARSAL TUNNEL SYNDROME, LEFT 10/08/2009  . PES PLANUS 10/08/2009    History obtained from: chart review and patient.  [name] a [age].o. male presenting for a follow up visit.  He was last seen in October  2021.  At that time, his lung testing looked fairly good.  We continued with Symbicort 160 mcg 2 puffs twice daily. For his allergic rhinitis, would continue with Nasacort and added on Atrovent to help with rhinorrhea.  Since last visit, he has done well. He did have a sleep study done and he was negative for OSA. He  had this done at [facility].  Asthma/Respiratory Symptom History: He continues on the Symbicort two puffs twice daily with a spacer. He has been using his rescue inhaler once daily. He is on 2L O2 at night. He thinks that that albuterol use is more habit rather than need. He does it mostly at night. [name]'s asthma has been well controlled. He has not required rescue medication, experienced nocturnal awakenings due to lower respiratory symptoms, nor have activities of daily living been limited. He has required no Emergency Department or Urgent Care visits for his asthma. He has required zero courses of systemic steroids for asthma exacerbations since the last visit. ACT score today is 17, indicating subpar asthma symptom control. Obviously his breathing has not been well controlled at this time.  Allergic Rhinitis Symptom History: Rhinorrhea is acting up slightly with the change in the weather. He has done very well with the Nasacort. He has not needed antibiotics at all since the last visit. He is generally doing fairly well from an allergic rhinitis standpoint.   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 He was previously working at [employer] He was let go because he ran out of leave. He might return there, but he seems to have some disagreement with the company regarding some policies.   Otherwise, there have been no changes to his past medical history, surgical history, family history, or social history.    Review of Systems  Constitutional: Negative.  Negative for chills, fever, malaise/fatigue and weight loss.  HENT: Negative.  Negative for congestion, ear discharge and ear pain.   Eyes: Negative for pain, discharge and redness.  Respiratory: Positive for cough and shortness of breath. Negative for sputum production and wheezing.   Cardiovascular: Negative.  Negative for chest pain and palpitations.  Gastrointestinal: Negative for abdominal  pain, constipation, diarrhea, heartburn, nausea and vomiting.  Skin: Negative.  Negative for itching and rash.  Neurological: Negative for dizziness and headaches.  Endo/Heme/Allergies: Positive for environmental allergies. Does not bruise/bleed easily.       Objective:   Blood pressure (!) 148/90, pulse 97, temperature 99.8 F (37.7 C), resp. rate 16, height 5' 8.5" (1.74 m), weight (!) 304 lb (137.9 kg), SpO2 98 %. Body mass index is 45.55 kg/m.   Physical Exam:  Physical Exam Constitutional:      Appearance: He is well-developed.     Comments: In wheelchair today.  HENT:     Head: Normocephalic and atraumatic.     Right Ear: Tympanic membrane, ear canal and external ear normal.     Left Ear: Tympanic membrane, ear canal and external ear normal.     Nose: No nasal deformity, septal deviation, mucosal edema or rhinorrhea.     Right Turbinates: Enlarged, swollen and pale.     Left Turbinates: Enlarged, swollen and pale.     Right Sinus: No maxillary sinus tenderness or frontal sinus tenderness.     Left Sinus: No maxillary sinus tenderness or frontal sinus tenderness.     Mouth/Throat:     Mouth: Mucous membranes are not pale and not dry.  Pharynx: Uvula midline.  Eyes:     General: Lids are normal. Allergic shiner present.        Right eye: No discharge.        Left eye: No discharge.     Conjunctiva/sclera: Conjunctivae normal.     Right eye: Right conjunctiva is not injected. No chemosis.    Left eye: Left conjunctiva is not injected. No chemosis.    Pupils: Pupils are equal, round, and reactive to light.  Cardiovascular:     Rate and Rhythm: Normal rate and regular rhythm.     Heart sounds: Normal heart sounds.  Pulmonary:     Effort: Pulmonary effort is normal. No tachypnea, accessory muscle usage or respiratory distress.     Breath sounds: Normal breath sounds. No wheezing, rhonchi or rales.     Comments: Decreased air movement at the bases. Talkative and  interactive.  Chest:     Chest wall: No tenderness.  Lymphadenopathy:     Cervical: No cervical adenopathy.  Skin:    General: Skin is warm.     Capillary Refill: Capillary refill takes less than 2 seconds.     Coloration: Skin is not pale.     Findings: Wound present. No abrasion, erythema, petechiae or rash. Rash is not papular, urticarial or vesicular.     Comments: No eczematous or urticarial lesions noted. He does have dressing in place on his feet secondary to pressure ulcers which are healing albeit slowly.   Neurological:     Mental Status: He is alert.  Psychiatric:        Behavior: Behavior is cooperative.      Diagnostic studies:    Spirometry: results abnormal (FEV1: 2.08/55%, FVC: 2.11/45%, FEV1/FVC: 99%).    Spirometry consistent with possible restrictive disease.   Allergy Studies: none        Salvatore Marvel, MD  [facility]

## 2020-12-17 ENCOUNTER — Encounter (HOSPITAL_BASED_OUTPATIENT_CLINIC_OR_DEPARTMENT_OTHER): Payer: BC Managed Care – PPO | Admitting: Internal Medicine

## 2020-12-17 ENCOUNTER — Other Ambulatory Visit: Payer: Self-pay

## 2020-12-17 DIAGNOSIS — Z8616 Personal history of COVID-19: Secondary | ICD-10-CM | POA: Diagnosis not present

## 2020-12-17 DIAGNOSIS — L97512 Non-pressure chronic ulcer of other part of right foot with fat layer exposed: Secondary | ICD-10-CM | POA: Diagnosis not present

## 2020-12-17 DIAGNOSIS — L89613 Pressure ulcer of right heel, stage 3: Secondary | ICD-10-CM | POA: Diagnosis not present

## 2020-12-17 DIAGNOSIS — L97522 Non-pressure chronic ulcer of other 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: Secondary | ICD-10-CM | POA: Diagnosis not present

## 2020-12-17 DIAGNOSIS — L89893 Pressure ulcer of other site, stage 3: Secondary | ICD-10-CM | POA: Diagnosis not present

## 2020-12-17 DIAGNOSIS — L89623 Pressure ulcer of left heel, stage 3: Secondary | ICD-10-CM | POA: Diagnosis not present

## 2020-12-18 ENCOUNTER — Other Ambulatory Visit: Payer: Self-pay

## 2020-12-18 MED ORDER — ALBUTEROL SULFATE HFA 108 (90 BASE) MCG/ACT IN AERS: 2.0000 | INHALATION_SPRAY | RESPIRATORY_TRACT | 1 refills | Status: DC | PRN

## 2020-12-21 ENCOUNTER — Encounter (HOSPITAL_BASED_OUTPATIENT_CLINIC_OR_DEPARTMENT_OTHER): Payer: BC Managed Care – PPO | Admitting: Internal Medicine

## 2020-12-21 ENCOUNTER — Other Ambulatory Visit: Payer: Self-pay

## 2020-12-21 DIAGNOSIS — Z8616 Personal history of COVID-19: Secondary | ICD-10-CM | POA: Diagnosis not present

## 2020-12-21 DIAGNOSIS — L89629 Pressure ulcer of left heel, unspecified stage: Secondary | ICD-10-CM | POA: Diagnosis not present

## 2020-12-21 DIAGNOSIS — L97512 Non-pressure chronic ulcer of other part of right foot with fat layer exposed: Secondary | ICD-10-CM | POA: Diagnosis not present

## 2020-12-21 DIAGNOSIS — L89893 Pressure ulcer of other site, stage 3: Secondary | ICD-10-CM | POA: Diagnosis not present

## 2020-12-21 DIAGNOSIS — L97522 Non-pressure chronic ulcer of other 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: Secondary | ICD-10-CM | POA: Diagnosis not present

## 2020-12-21 NOTE — Progress Notes (Signed)
ART, [name] ([id number]) Visit Report for 12/17/2020 Arrival Information Details Patient Name: Date of Service: [name], [name] 12/17/2020 10:15 A M Medical Record Number: [id number] Patient Account Number: [id number] Date of Birth/Sex: Treating RN: 1976/05/10 (45 y.o. [name], [name] Primary Care Alethia Melendrez: [name] Other Clinician: Referring [name]: Treating [name]/Extender: [name] in Treatment: 29 Visit Information History Since Last Visit Added or deleted any medications: No Patient Arrived: Wheel Chair Any new allergies or adverse reactions: No Arrival Time: 10:56 Had a fall or experienced change in No Accompanied By: sister activities of daily living that may affect Transfer Assistance: None risk of falls: Patient Identification Verified: Yes Signs or symptoms of abuse/neglect since last visito No Secondary Verification Process Completed: Yes Hospitalized since last visit: No Patient Requires Transmission-Based Precautions: No Implantable device outside of the clinic excluding No Patient Has Alerts: No cellular tissue based products placed in the center since last visit: Has Dressing in Place as Prescribed: Yes Pain Present Now: Yes Electronic Signature(s) Signed: 12/17/2020 12:57:05 PM By: Sandre Kitty Entered By: Sandre Kitty on 12/17/2020 10:56:54 -------------------------------------------------------------------------------- Lower Extremity Assessment Details Patient Name: Date of Service: [name], [name] E. 12/17/2020 10:15 A M Medical Record Number: [id number] Patient Account Number: [id number] Date of Birth/Sex: Treating RN: 1976/07/07 (45 y.o. [name] Primary Care Aleeah Greeno: [name] Other Clinician: Referring [name]: Treating [name]/Extender: [name] in Treatment: 22 Edema Assessment Assessed: [name]: Yes] [name]: Yes] Edema: [Left: Yes] [Right: Yes] Calf Left:  Right: Point of Measurement: 29 cm From Medial Instep 43 cm 43 cm Ankle Left: Right: Point of Measurement: 9 cm From Medial Instep 25 cm 24.5 cm Vascular Assessment Pulses: Dorsalis Pedis Palpable: [Left:Yes] [Right:Yes] Electronic Signature(s) Signed: 12/17/2020 4:36:36 PM By: Lorrin Jackson Entered By: Lorrin Jackson on 12/17/2020 11:07:01 -------------------------------------------------------------------------------- Multi-Disciplinary Care Plan Details Patient Name: Date of Service: [name], [name] 12/17/2020 10:15 A M Medical Record Number: [id number] Patient Account Number: [id number] Date of Birth/Sex: Treating RN: 1975-11-19 (45 y.o. [name], [name] Primary Care [name]: [name] Other Clinician: Referring Koltyn Kelsay: Treating Faithanne Verret/Extender: [name] in Treatment: 46 Multidisciplinary Care Plan reviewed with physician Active Inactive Abuse / Safety / Falls / Self Care Management Nursing Diagnoses: Potential for falls Goals: Patient/caregiver will verbalize/demonstrate measures taken to prevent injury and/or falls Date Initiated: 07/15/2020 Target Resolution Date: 02/05/2021 Goal Status: Active Interventions: Assess fall risk on admission and as needed Assess impairment of mobility on admission and as needed per policy Notes: Electronic Signature(s) Signed: 12/21/2020 6:08:57 PM By: [name] RN Entered By: [name] on 12/17/2020 11:34:51 -------------------------------------------------------------------------------- Pain Assessment Details Patient Name: Date of Service: [name], [place] E. 12/17/2020 10:15 A M Medical Record Number: [id number] Patient Account Number: [id number] Date of Birth/Sex: Treating RN: [birth year] (45 y.o. [name] Primary Care [name]: [name] Other Clinician: Referring [name]: Treating [name]/Extender: [name] in Treatment: 22 Active  Problems Location of Pain Severity and Description of Pain Patient Has Paino Yes Site Locations Rate the pain. Rate the pain. Current Pain Level: 4 Pain Management and Medication Current Pain Management: Electronic Signature(s) Signed: 12/17/2020 12:57:05 PM By: Sandre Kitty Signed: 12/21/2020 6:08:57 PM By: [name] RN Entered By: Sandre Kitty on 12/17/2020 10:57:22 -------------------------------------------------------------------------------- Patient/Caregiver Education Details Patient Name: Date of Service: [name], [name] 5/12/2022andnbsp10:15 [name] Record Number: [id number] Patient Account Number: [id number] Date of Birth/Gender: Treating RN: 05/12/1976 (45 y.o.  [name] Primary Care Physician: [name] Other Clinician: Referring Physician: Treating Physician/Extender: [name] in Treatment: 67 Education Assessment Education Provided To: Patient Education Topics Provided Wound/Skin Impairment: Methods: Explain/Verbal Responses: State content correctly [employer]) Signed: 12/21/2020 6:08:57 PM By: [name] RN Entered By: [name] on 12/17/2020 11:34:38 -------------------------------------------------------------------------------- Wound Assessment Details Patient Name: Date of Service: [name], [place] 12/17/2020 10:15 A M Medical Record Number: [id number] Patient Account Number: [id number] Date of Birth/Sex: Treating RN: 1976/03/18 (45 y.o. [name] Primary Care [name]: [name] Other Clinician: Referring [name]: Treating [name]/Extender: [name] in Treatment: 22 Wound Status Wound Number: 1 Primary Etiology: Pressure Ulcer Wound Location: Right Calcaneus Wound Status: Open Wounding Event: Pressure Injury Comorbid History: Asthma, Angina, Hypertension Date Acquired: 10/07/2019 Weeks Of Treatment: 22 Clustered Wound:  No Photos Wound Measurements Length: (cm) 3.4 Width: (cm) 1.1 Depth: (cm) 0.3 Area: (cm) 2.937 Volume: (cm) 0.881 % Reduction in Area: 90.7% % Reduction in Volume: 97.2% Epithelialization: Medium (34-66%) Tunneling: No Undermining: No Wound Description Classification: Category/Stage III Wound Margin: Thickened Exudate Amount: Medium Exudate Type: Serosanguineous Exudate Color: red, brown Foul Odor After Cleansing: No Slough/Fibrino Yes Wound Bed Granulation Amount: Large (67-100%) Exposed Structure Granulation Quality: Red, Pink Fascia Exposed: No Necrotic Amount: Small (1-33%) Fat Layer (Subcutaneous Tissue) Exposed: Yes Necrotic Quality: Adherent Slough Tendon Exposed: No Muscle Exposed: No Joint Exposed: No Bone Exposed: No Assessment Notes Calloused Periwound Electronic Signature(s) Signed: 12/17/2020 3:37:13 PM By: Sandre Kitty Signed: 12/17/2020 4:36:36 PM By: Lorrin Jackson Entered By: Sandre Kitty on 12/17/2020 15:30:01 -------------------------------------------------------------------------------- Wound Assessment Details Patient Name: Date of Service: [name], [name] 12/17/2020 10:15 A M Medical Record Number: [id number] Patient Account Number: [id number] Date of Birth/Sex: Treating RN: May 05, 1976 (45 y.o. [name] Primary Care Devlon Dosher: [name] Other Clinician: Referring Jahrel Borthwick: Treating Moira Umholtz/Extender: [name] in Treatment: 22 Wound Status Wound Number: 2 Primary Etiology: Pressure Ulcer Wound Location: Left Calcaneus Wound Status: Open Wounding Event: Pressure Injury Comorbid History: Asthma, Angina, Hypertension Date Acquired: 10/07/2019 Weeks Of Treatment: 22 Clustered Wound: No Photos Wound Measurements Length: (cm) 4.6 Width: (cm) 1.6 Depth: (cm) 0.3 Area: (cm) 5.781 Volume: (cm) 1.734 % Reduction in Area: 78.7% % Reduction in Volume: 93.6% Epithelialization: Small  (1-33%) Tunneling: No Undermining: No Wound Description Classification: Category/Stage III Wound Margin: Thickened Exudate Amount: Medium Exudate Type: Serosanguineous Exudate Color: red, brown Foul Odor After Cleansing: No Slough/Fibrino Yes Wound Bed Granulation Amount: Large (67-100%) Exposed Structure Granulation Quality: Red, Pink Fascia Exposed: No Necrotic Amount: Small (1-33%) Fat Layer (Subcutaneous Tissue) Exposed: Yes Necrotic Quality: Adherent Slough Tendon Exposed: No Muscle Exposed: No Joint Exposed: No Bone Exposed: No Assessment Notes Calloused Periwound Electronic Signature(s) Signed: 12/17/2020 3:37:13 PM By: Sandre Kitty Signed: 12/17/2020 4:36:36 PM By: Lorrin Jackson Entered By: Sandre Kitty on 12/17/2020 15:30:25 -------------------------------------------------------------------------------- Vitals Details Patient Name: Date of Service: [name], [place] E. 12/17/2020 10:15 A M Medical Record Number: [id number] Patient Account Number: [id number] Date of Birth/Sex: Treating RN: January 06, 1976 (45 y.o. [name], [name] Primary Care Serina Nichter: [name] Other Clinician: Referring Daxtin Leiker: Treating Aiza Vollrath/Extender: [name] in Treatment: 22 Vital Signs Time Taken: 10:56 Temperature (F): 98.3 Height (in): 69 Pulse (bpm): 79 Weight (lbs): 280 Respiratory Rate (breaths/min): 16 Body Mass Index (BMI): 41.3 Blood Pressure (mmHg): 152/84 Reference Range: 80 - 120 mg / dl Electronic Signature(s) Signed: 12/17/2020 12:57:05 PM By: Sandre Kitty Entered By:  Sandre Kitty on 12/17/2020 10:57:07

## 2020-12-21 NOTE — Progress Notes (Signed)
[name], [name] ([id number]) Visit Report for 12/17/2020 Debridement Details Patient Name: Date of Service: [name], [place] 12/17/2020 10:15 A M Medical Record Number: [id number] Patient Account Number: [id number] Date of Birth/Sex: Treating RN: 1976/06/29 (45 y.o. [name], [name] Primary Care Provider: [name] Other Clinician: Referring Provider: Treating Provider/Extender: [name] in Treatment: 22 Debridement Performed for Assessment: Wound #1 Right Calcaneus Performed By: Physician Ricard Dillon., MD Debridement Type: Debridement Level of Consciousness (Pre-procedure): Awake and Alert Pre-procedure Verification/Time Out Yes - 11:34 Taken: Start Time: 11:34 Pain Control: Lidocaine T Area Debrided (L x W): otal 3.4 (cm) x 1.1 (cm) = 3.74 (cm) Tissue and other material debrided: Viable, Non-Viable, Subcutaneous, Skin: Dermis , Skin: Epidermis Level: Skin/Subcutaneous Tissue Debridement Description: Excisional Instrument: Curette Bleeding: Minimum Hemostasis Achieved: Pressure End Time: 11:35 Procedural Pain: 0 Post Procedural Pain: 0 Response to Treatment: Procedure was tolerated well Level of Consciousness (Post- Awake and Alert procedure): Post Debridement Measurements of Total Wound Length: (cm) 3.4 Stage: Category/Stage III Width: (cm) 1.1 Depth: (cm) 0.3 Volume: (cm) 0.881 Character of Wound/Ulcer Post Debridement: Improved Post Procedure Diagnosis Same as Pre-procedure Electronic Signature(s) Signed: 12/17/2020 3:44:17 PM By: Linton Ham MD Signed: 12/21/2020 6:08:57 PM By: [name] RN Entered By: Linton Ham on 12/17/2020 12:31:45 -------------------------------------------------------------------------------- Debridement Details Patient Name: Date of Service: [name], [name] 12/17/2020 10:15 A M Medical Record Number: [id number] Patient Account Number: [id number] Date of Birth/Sex: Treating  RN: [birth year] (44 y.o. [name], [name] Primary Care Provider: [name] Other Clinician: Referring Provider: Treating Provider/Extender: [name] in Treatment: 22 Debridement Performed for Assessment: Wound #2 Left Calcaneus Performed By: Physician Ricard Dillon., MD Debridement Type: Debridement Level of Consciousness (Pre-procedure): Awake and Alert Pre-procedure Verification/Time Out Yes - 11:32 Taken: Start Time: 11:32 Pain Control: Lidocaine T Area Debrided (L x W): otal 4.6 (cm) x 1.6 (cm) = 7.36 (cm) Tissue and other material debrided: Viable, Non-Viable Level: Non-Viable Tissue Debridement Description: Selective/Open Wound Instrument: Curette Bleeding: Minimum Hemostasis Achieved: Pressure End Time: 11:33 Procedural Pain: 0 Post Procedural Pain: 0 Response to Treatment: Procedure was tolerated well Level of Consciousness (Post- Awake and Alert procedure): Post Debridement Measurements of Total Wound Length: (cm) 4.6 Stage: Category/Stage III Width: (cm) 1.6 Depth: (cm) 0.3 Volume: (cm) 1.734 Character of Wound/Ulcer Post Debridement: Improved Post Procedure Diagnosis Same as Pre-procedure Electronic Signature(s) Signed: 12/17/2020 3:44:17 PM By: Linton Ham MD Signed: 12/21/2020 6:08:57 PM By: [name] RN Entered By: Linton Ham on 12/17/2020 12:32:06 -------------------------------------------------------------------------------- HPI Details Patient Name: Date of Service: [name], [place] E. 12/17/2020 10:15 A M Medical Record Number: [id number] Patient Account Number: [id number] Date of Birth/Sex: Treating RN: 04/29/1976 (45 y.o. [name] Primary Care Provider: [name] Other Clinician: Referring Provider: Treating Provider/Extender: [name] in Treatment: 22 History of Present Illness HPI Description: 07/15/2020 upon evaluation today patient presents for initial  inspection here in our clinic concerning issues he has been having with the bottoms of his feet bilaterally. He states these actually occurred as wounds when he was hospitalized for 5 months secondary to Covid. He was apparently with tilting bed where he was in an upright position quite frequently and apparently this occurred in some way shape or form during that time. Fortunately there is no sign of active infection at this time. No fevers, chills, nausea, vomiting, or diarrhea. With that being said he still has substantial wounds on the plantar  aspects of his feet Theragen require quite a bit of work to get these to heal. He has been using Santyl currently though that is been problematic both in receiving the medication as well as actually paid for it as it is become quite expensive. Prior to the experience with Covid the patient really did not have any major medical problems other than hypertension he does have some mild generalized weakness following the Covid experience. 07/22/2020 on evaluation today patient appears to be doing okay in regard to his foot ulcers I feel like the wound beds are showing signs of better improvement that I do believe the Iodoflex is helping in this regard. With that being said he does have a lot of drainage currently and this is somewhat blue/green in nature which is consistent with Pseudomonas. I do think a culture today would be appropriate for [place] to evaluate and see if that is indeed the case I would likely start him on antibiotic orally as well he is not allergic to Cipro knows of no issues he has had in the past 12/21; patient was admitted to the clinic earlier this month with bilateral presumed pressure ulcers on the bottom of his feet apparently related to excessive pressure from a tilt table arrangement in the intensive care unit. Patient relates this to being on ECMO but I am not really sure that is exactly related to that. I must say I have never seen anything  like this. He has fairly extensive full-thickness wounds extending from his heel towards his midfoot mostly centered laterally. There is already been some healing distally. He does not appear to have an arterial issue. He has been using gentamicin to the wound surfaces with Iodoflex to help with ongoing debridement 1/6; this is a patient with pressure ulcers on the bottom of his feet related to excessive pressure from a standing position in the intensive care unit. He is complaining of a lot of pain in the right heel. He is not a diabetic. 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 We have been using Iodoflex to help prepare the surfaces of both wounds for an advanced treatment product. He is nonambulatory spending most of his time in a wheelchair I have asked him not to propel the wheelchair with his heels 1/13; in general his wounds look better not much surface area change we have been using Iodoflex as of last week. I did an x-ray of the right heel as the patient was complaining of pain. I had some thoughts about a stress fracture perhaps Achilles tendon problems however what it showed was erosive changes along the inferior aspect of the calcaneus he now has a MRI booked for 1/20. 1/20; in general his wounds continue to be better. Some improvement in the large narrow areas proximally in his foot. He is still complaining of pain in the right heel and tenderness in certain areas of this wound. His MRI is tonight. I am not just looking for osteomyelitis that was brought up on the x-ray I am wondering about stress fractures, tendon ruptures etc. He has no such findings on the left. Also noteworthy is that the patient had critical illness neuropathy and some of the discomfort may be actual improvement in nerve function I am just not sure. These wounds were initially in the setting of severe critical illness related to COVID-19. He was put in a standing position. He may have also  been on pressors at the point contributing to tissue ischemia.  By his description at some point these wounds were grossly necrotic extending proximally up into the Achilles part of his heel. I do not know that I have ever really seen pictures of them like this although they may exist in epic We have ordered Tri layer Oasis. I am trying to stimulate some granulation in these areas. This is of course assuming the MRI is negative for infection 1/27; since the patient was last here he saw Dr. Juleen China of infectious disease. He is planned for vancomycin and ceftriaxone. Prior operative culture grew MSSA. Also ordered baseline lab work. He also ordered arterial studies although the ABIs in our clinic were normal as well as his clinical exam these were normal I do not think he needs to see vascular surgery. His ABIs at the PTA were 1.22 in the right triphasic waveforms with a normal TBI of 1.15 on the left ABI of 1.22 with triphasic waveforms and a normal TBI of 1.08. Finally he saw Dr. Amalia Hailey who will follow him in for 2 months. At this point I do not think he felt that he needed a procedure on the right calcaneal bone. Dr. Juleen China is elected for broad-spectrum antibiotic The patient is still having pain in the right heel. He walks with a walker 2/3; wounds are generally smaller. He is tolerating his IV antibiotics. I believe this is vancomycin and ceftriaxone. We are still waiting for Oasis burn in terms of his out-of-pocket max which he should be meeting soon given the IV antibiotics, MRIs etc. I have asked him to check in on this. We are using silver collagen in the meantime the wounds look better 2/10; tolerating IV vancomycin and Rocephin. We are waiting to apply for Oasis. Although I am not really sure where he is in his out-of-pocket max. 2/17 started the first application of Oasis trilayer. Still on antibiotics. The wounds have generally look better. The area on the left has a little more  surface slough requiring debridement 4/33; second application of Oasis trilayer. The wound surface granulation is generally look better. The area on the left with undermining laterally I think is come in a bit. 10/08/2020 upon evaluation today patient is here today for [employer] application #3. Fortunately he seems to be doing extremely well with regard to this and we are seeing a lot of new epithelial growth which is great news. Fortunately there is no signs of active infection at this time. 10/16/2020 upon evaluation today patient appears to be doing well with regard to his foot ulcers. Do believe the Oasis has been of benefit for him. I do not see any signs of infection right now which is great news and I think that he has a lot of new epithelial growth which is great to see as well. The patient is very pleased to hear all of this. I do think we can proceed with the Oasis trilayer #4 today. 3/18; not as much improvement in these areas on his heels that I was hoping. I did reapply trilateral Oasis today the tissue looks healthier but not as much fill in as I was hoping. 3/25; better looking today I think this is come in a bit the tissue looks healthier. Triple layer Oasis reapplied #6 4/1; somewhat better looking definitely better looking surface not as much change in surface area as I was hoping. He may be spending more time [place] on days then he needs to although he does have heel offloading boots. Triple layer Oasis reapplied #7  4/7; unfortunately apparently [facility] will not approve any further Oasis which is unfortunate since the patient did respond nicely both in terms of the condition of the wound bed as well as surface area. There is still some drainage coming from the wound but not a lot there does not appear to be any infection 4/15; we have been using Hydrofera Blue. He continues to have nice rims of epithelialization on the right greater than the left. The left the  epithelialization is coming from the tip of his heel. There is moderate drainage. In this that concerns me about a total contact cast. There is no evidence of infection 4/29; patient has been using Hydrofera Blue with dressing changes. He has no complaints or issues today. 5/5; using Hydrofera Blue. I actually think that he looks marginally better than the last time I saw this 3 weeks ago. There are rims of epithelialization on the left thumb coming from the medial side on the right. Using Hydrofera Blue 5/12; using Hydrofera Blue. These continue to make improvements in surface area. His drainage was not listed as severe I therefore went ahead and put a cast on the left foot. Right foot we will continue to dress his previous Electronic Signature(s) Signed: 12/17/2020 3:44:17 PM By: Linton Ham MD Entered By: Linton Ham on 12/17/2020 12:35:52 -------------------------------------------------------------------------------- Physical Exam Details Patient Name: Date of Service: [name], [place] 12/17/2020 10:15 A M Medical Record Number: [id number] Patient Account Number: [id number] Date of Birth/Sex: Treating RN: 07-13-1976 (45 y.o. [name] Primary Care Provider: [name] Other Clinician: Referring Provider: Treating Provider/Extender: [name] in Treatment: 70 Constitutional Patient is hypertensive.. Pulse regular and within target range for patient.Marland Kitchen Respirations regular, non-labored and within target range.. Temperature is normal and within the target range for the patient.Marland Kitchen Appears in no distress. Notes Wound exam; left and right plantar foot wounds initially were long wounds maximum and volume at the heels but coining into his midfoot. Most of the midfoot area has healed over. And he has had improvement in the heels. I used a #5 curette to remove adherent surface slough from the wounds on his heel subcutaneous debris. I also carried this  forward into the midfoot hemostasis with direct pressure. After this we dressed the wounds and applied a total contact cast on the left Electronic Signature(s) Signed: 12/17/2020 3:44:17 PM By: Linton Ham MD Entered By: Linton Ham on 12/17/2020 12:36:58 -------------------------------------------------------------------------------- Physician Orders Details Patient Name: Date of Service: [name], [place] 12/17/2020 10:15 A M Medical Record Number: [id number] Patient Account Number: [id number] Date of Birth/Sex: Treating RN: 01/22/1976 (45 y.o. [name] Primary Care Provider: [name] Other Clinician: Referring Provider: Treating Provider/Extender: [name] in Treatment: 22 Verbal / Phone Orders: No Diagnosis Coding Follow-up Appointments ppointment in 1 week. - on Monday for 1st TCC cast change...***60 minutes for cast changes**** Return A ppointment in: - Thursday for [facility] appt. ****60 minutes for cast changes**** Return A Bathing/ Shower/ Hygiene May shower with protection but do not get wound dressing(s) wet. Edema Control - Lymphedema / SCD / Other Bilateral Lower Extremities Elevate legs to the level of the heart or above for 30 minutes daily and/or when sitting, a frequency of: Avoid standing for long periods of time. Off-Loading Total Contact Cast to Left Lower Extremity Wedge shoe to: - Gloped Shoe to bilateral feet Other: - keep pressure off of the bottom of your feet Additional  Orders / Instructions Follow Nutritious Diet Wound Treatment Wound #1 - Calcaneus Wound Laterality: Right Cleanser: Normal Saline (Generic) Every Other Day/15 Days Discharge Instructions: Cleanse the wound with Normal Saline prior to applying a clean dressing using gauze sponges, not tissue or cotton balls. Cleanser: Wound Cleanser Every Other Day/15 Days Discharge Instructions: Cleanse the wound with wound cleanser prior to applying a clean  dressing using gauze sponges, not tissue or cotton balls. Prim Dressing: Hydrofera Blue Classic Foam, 4x4 in (Dispense As Written) Every Other Day/15 Days ary Discharge Instructions: Moisten with saline prior to applying to wound bed Secondary Dressing: Woven Gauze Sponge, Non-Sterile 4x4 in (Generic) Every Other Day/15 Days Discharge Instructions: Apply over primary dressing as directed. Secondary Dressing: ABD Pad, 8x10 (Generic) Every Other Day/15 Days Discharge Instructions: Apply over primary dressing as directed. Secondary Dressing: Zetuvit Plus 4x8 in (Generic) Every Other Day/15 Days Discharge Instructions: Apply over primary dressing as directed. Secured With: Elastic Bandage 4 inch (ACE bandage) Every Other Day/15 Days Discharge Instructions: Secure with ACE bandage as directed. Secured With: [employer], 4.5x3.1 (in/yd) (Generic) Every Other Day/15 Days Discharge Instructions: Secure with Kerlix as directed. Wound #2 - Calcaneus Wound Laterality: Left Cleanser: Normal Saline (Generic) Every Other Day/15 Days Discharge Instructions: Cleanse the wound with Normal Saline prior to applying a clean dressing using gauze sponges, not tissue or cotton balls. Cleanser: Wound Cleanser Every Other Day/15 Days Discharge Instructions: Cleanse the wound with wound cleanser prior to applying a clean dressing using gauze sponges, not tissue or cotton balls. Prim Dressing: Hydrofera Blue Classic Foam, 4x4 in (Dispense As Written) Every Other Day/15 Days ary Discharge Instructions: Moisten with saline prior to applying to wound bed Secondary Dressing: Woven Gauze Sponge, Non-Sterile 4x4 in (Generic) Every Other Day/15 Days Discharge Instructions: Apply over primary dressing as directed. Secondary Dressing: ABD Pad, 8x10 (Generic) Every Other Day/15 Days Discharge Instructions: Apply over primary dressing as directed. Secondary Dressing: Zetuvit Plus 4x8 in (Generic) Every Other Day/15  Days Discharge Instructions: Apply over primary dressing as directed. Secured With: Elastic Bandage 4 inch (ACE bandage) Every Other Day/15 Days Discharge Instructions: Secure with ACE bandage as directed. Secured With: [employer], 4.5x3.1 (in/yd) (Generic) Every Other Day/15 Days Discharge Instructions: Secure with Kerlix as directed. Electronic Signature(s) Signed: 12/17/2020 3:44:17 PM By: Linton Ham MD Signed: 12/21/2020 6:08:57 PM By: [name] RN Entered By: [name] on 12/17/2020 11:36:46 -------------------------------------------------------------------------------- Progress Note Details Patient Name: Date of Service: [name], [place] 12/17/2020 10:15 A M Medical Record Number: [id number] Patient Account Number: [id number] Date of Birth/Sex: Treating RN: 07-08-1976 (45 y.o. [name], [name] Primary Care Provider: [name] Other Clinician: Referring Provider: Treating Provider/Extender: [name] in Treatment: 22 Subjective History of Present Illness (HPI) 07/15/2020 upon evaluation today patient presents for initial inspection here in our clinic concerning issues he has been having with the bottoms of his feet bilaterally. He states these actually occurred as wounds when he was hospitalized for 5 months secondary to Covid. He was apparently with tilting bed where he was in an upright position quite frequently and apparently this occurred in some way shape or form during that time. Fortunately there is no sign of active infection at this time. No fevers, chills, nausea, vomiting, or diarrhea. With that being said he still has substantial wounds on the plantar aspects of his feet Theragen require quite a bit of work to get these to heal. He has been using Santyl currently  though that is been problematic both in receiving the medication as well as actually paid for it as it is become quite expensive. Prior to the experience  with Covid the patient really did not have any major medical problems other than hypertension he does have some mild generalized weakness following the Covid experience. 07/22/2020 on evaluation today patient appears to be doing okay in regard to his foot ulcers I feel like the wound beds are showing signs of better improvement that I do believe the Iodoflex is helping in this regard. With that being said he does have a lot of drainage currently and this is somewhat blue/green in nature which is consistent with Pseudomonas. I do think a culture today would be appropriate for [place] to evaluate and see if that is indeed the case I would likely start him on antibiotic orally as well he is not allergic to Cipro knows of no issues he has had in the past 12/21; patient was admitted to the clinic earlier this month with bilateral presumed pressure ulcers on the bottom of his feet apparently related to excessive pressure from a tilt table arrangement in the intensive care unit. Patient relates this to being on ECMO but I am not really sure that is exactly related to that. I must say I have never seen anything like this. He has fairly extensive full-thickness wounds extending from his heel towards his midfoot mostly centered laterally. There is already been some healing distally. He does not appear to have an arterial issue. He has been using gentamicin to the wound surfaces with Iodoflex to help with ongoing debridement 1/6; this is a patient with pressure ulcers on the bottom of his feet related to excessive pressure from a standing position in the intensive care unit. He is complaining of a lot of pain in the right heel. He is not a diabetic. 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 We have been using Iodoflex to help prepare the surfaces of both wounds for an advanced treatment product. He is nonambulatory spending most of his time in a wheelchair I have asked him not to propel the  wheelchair with his heels 1/13; in general his wounds look better not much surface area change we have been using Iodoflex as of last week. I did an x-ray of the right heel as the patient was complaining of pain. I had some thoughts about a stress fracture perhaps Achilles tendon problems however what it showed was erosive changes along the inferior aspect of the calcaneus he now has a MRI booked for 1/20. 1/20; in general his wounds continue to be better. Some improvement in the large narrow areas proximally in his foot. He is still complaining of pain in the right heel and tenderness in certain areas of this wound. His MRI is tonight. I am not just looking for osteomyelitis that was brought up on the x-ray I am wondering about stress fractures, tendon ruptures etc. He has no such findings on the left. Also noteworthy is that the patient had critical illness neuropathy and some of the discomfort may be actual improvement in nerve function I am just not sure. These wounds were initially in the setting of severe critical illness related to COVID-19. He was put in a standing position. He may have also been on pressors at the point contributing to tissue ischemia. By his description at some point these wounds were grossly necrotic extending proximally up into the Achilles part of his heel. I  do not know that I have ever really seen pictures of them like this although they may exist in epic We have ordered Tri layer Oasis. I am trying to stimulate some granulation in these areas. This is of course assuming the MRI is negative for infection 1/27; since the patient was last here he saw Dr. Juleen China of infectious disease. He is planned for vancomycin and ceftriaxone. Prior operative culture grew MSSA. Also ordered baseline lab work. He also ordered arterial studies although the ABIs in our clinic were normal as well as his clinical exam these were normal I do not think he needs to see vascular surgery. His  ABIs at the PTA were 1.22 in the right triphasic waveforms with a normal TBI of 1.15 on the left ABI of 1.22 with triphasic waveforms and a normal TBI of 1.08. Finally he saw Dr. Amalia Hailey who will follow him in for 2 months. At this point I do not think he felt that he needed a procedure on the right calcaneal bone. Dr. Juleen China is elected for broad-spectrum antibiotic The patient is still having pain in the right heel. He walks with a walker 2/3; wounds are generally smaller. He is tolerating his IV antibiotics. I believe this is vancomycin and ceftriaxone. We are still waiting for Oasis burn in terms of his out-of-pocket max which he should be meeting soon given the IV antibiotics, MRIs etc. I have asked him to check in on this. We are using silver collagen in the meantime the wounds look better 2/10; tolerating IV vancomycin and Rocephin. We are waiting to apply for Oasis. Although I am not really sure where he is in his out-of-pocket max. 2/17 started the first application of Oasis trilayer. Still on antibiotics. The wounds have generally look better. The area on the left has a little more surface slough requiring debridement 4/40; second application of Oasis trilayer. The wound surface granulation is generally look better. The area on the left with undermining laterally I think is come in a bit. 10/08/2020 upon evaluation today patient is here today for [employer] application #3. Fortunately he seems to be doing extremely well with regard to this and we are seeing a lot of new epithelial growth which is great news. Fortunately there is no signs of active infection at this time. 10/16/2020 upon evaluation today patient appears to be doing well with regard to his foot ulcers. Do believe the Oasis has been of benefit for him. I do not see any signs of infection right now which is great news and I think that he has a lot of new epithelial growth which is great to see as well. The patient is  very pleased to hear all of this. I do think we can proceed with the Oasis trilayer #4 today. 3/18; not as much improvement in these areas on his heels that I was hoping. I did reapply trilateral Oasis today the tissue looks healthier but not as much fill in as I was hoping. 3/25; better looking today I think this is come in a bit the tissue looks healthier. Triple layer Oasis reapplied #6 4/1; somewhat better looking definitely better looking surface not as much change in surface area as I was hoping. He may be spending more time [place] on days then he needs to although he does have heel offloading boots. Triple layer Oasis reapplied #7 4/7; unfortunately apparently [facility] will not approve any further Oasis which is unfortunate since the patient did respond  nicely both in terms of the condition of the wound bed as well as surface area. There is still some drainage coming from the wound but not a lot there does not appear to be any infection 4/15; we have been using Hydrofera Blue. He continues to have nice rims of epithelialization on the right greater than the left. The left the epithelialization is coming from the tip of his heel. There is moderate drainage. In this that concerns me about a total contact cast. There is no evidence of infection 4/29; patient has been using Hydrofera Blue with dressing changes. He has no complaints or issues today. 5/5; using Hydrofera Blue. I actually think that he looks marginally better than the last time I saw this 3 weeks ago. There are rims of epithelialization on the left thumb coming from the medial side on the right. Using Hydrofera Blue 5/12; using Hydrofera Blue. These continue to make improvements in surface area. His drainage was not listed as severe I therefore went ahead and put a cast on the left foot. Right foot we will continue to dress his previous Objective Constitutional Patient is hypertensive.. Pulse regular and within target  range for patient.Marland Kitchen Respirations regular, non-labored and within target range.. Temperature is normal and within the target range for the patient.Marland Kitchen Appears in no distress. Vitals Time Taken: 10:56 AM, Height: 69 in, Weight: 280 lbs, BMI: 41.3, Temperature: 98.3 F, Pulse: 79 bpm, Respiratory Rate: 16 breaths/min, Blood Pressure: 152/84 mmHg. General Notes: Wound exam; left and right plantar foot wounds initially were long wounds maximum and volume at the heels but coining into his midfoot. Most of the midfoot area has healed over. And he has had improvement in the heels. I used a #5 curette to remove adherent surface slough from the wounds on his heel subcutaneous debris. I also carried this forward into the midfoot hemostasis with direct pressure. After this we dressed the wounds and applied a total contact cast on the left Integumentary (Hair, Skin) Wound #1 status is Open. Original cause of wound was Pressure Injury. The date acquired was: 10/07/2019. The wound has been in treatment 22 weeks. The wound is located on the Right Calcaneus. The wound measures 3.4cm length x 1.1cm width x 0.3cm depth; 2.937cm^2 area and 0.881cm^3 volume. There is Fat Layer (Subcutaneous Tissue) exposed. There is no tunneling or undermining noted. There is a medium amount of serosanguineous drainage noted. The wound margin is thickened. There is large (67-100%) red, pink granulation within the wound bed. There is a small (1-33%) amount of necrotic tissue within the wound bed including Adherent Slough. General Notes: Calloused Periwound Wound #2 status is Open. Original cause of wound was Pressure Injury. The date acquired was: 10/07/2019. The wound has been in treatment 22 weeks. The wound is located on the Left Calcaneus. The wound measures 4.6cm length x 1.6cm width x 0.3cm depth; 5.781cm^2 area and 1.734cm^3 volume. There is Fat Layer (Subcutaneous Tissue) exposed. There is no tunneling or undermining noted. There is a  medium amount of serosanguineous drainage noted. The wound margin is thickened. There is large (67-100%) red, pink granulation within the wound bed. There is a small (1-33%) amount of necrotic tissue within the wound bed including Adherent Slough. General Notes: Calloused Periwound Procedures Wound #1 Pre-procedure diagnosis of Wound #1 is a Pressure Ulcer located on the Right Calcaneus . There was a Excisional Skin/Subcutaneous Tissue Debridement with a total area of 3.74 sq cm performed by Ricard Dillon., MD. With the  following instrument(s): Curette to remove Viable and Non-Viable tissue/material. Material removed includes Subcutaneous Tissue, Skin: Dermis, and Skin: Epidermis after achieving pain control using Lidocaine. No specimens were taken. A time out was conducted at 11:34, prior to the start of the procedure. A Minimum amount of bleeding was controlled with Pressure. The procedure was tolerated well with a pain level of 0 throughout and a pain level of 0 following the procedure. Post Debridement Measurements: 3.4cm length x 1.1cm width x 0.3cm depth; 0.881cm^3 volume. Post debridement Stage noted as Category/Stage III. Character of Wound/Ulcer Post Debridement is improved. Post procedure Diagnosis Wound #1: Same as Pre-Procedure Wound #2 Pre-procedure diagnosis of Wound #2 is a Pressure Ulcer located on the Left Calcaneus . There was a Selective/Open Wound Non-Viable Tissue Debridement with a total area of 7.36 sq cm performed by Ricard Dillon., MD. With the following instrument(s): Curette to remove Viable and Non-Viable tissue/material. after achieving pain control using Lidocaine. No specimens were taken. A time out was conducted at 11:32, prior to the start of the procedure. A Minimum amount of bleeding was controlled with Pressure. The procedure was tolerated well with a pain level of 0 throughout and a pain level of 0 following the procedure. Post Debridement Measurements:  4.6cm length x 1.6cm width x 0.3cm depth; 1.734cm^3 volume. Post debridement Stage noted as Category/Stage III. Character of Wound/Ulcer Post Debridement is improved. Post procedure Diagnosis Wound #2: Same as Pre-Procedure Pre-procedure diagnosis of Wound #2 is a Pressure Ulcer located on the Left Calcaneus . There was a T Contact Cast Procedure by Deno Etienne MD. Post procedure Diagnosis Wound #2: Same as Pre-Procedure Plan Follow-up Appointments: Return Appointment in 1 week. - on Monday for 1st TCC cast change...***60 minutes for cast changes**** Return Appointment in: - Thursday for [facility] appt. ****60 minutes for cast changes**** Bathing/ Shower/ Hygiene: May shower with protection but do not get wound dressing(s) wet. Edema Control - Lymphedema / SCD / Other: Elevate legs to the level of the heart or above for 30 minutes daily and/or when sitting, a frequency of: Avoid standing for long periods of time. Off-Loading: T Contact Cast to Left Lower Extremity otal Wedge shoe to: - Gloped Shoe to bilateral feet Other: - keep pressure off of the bottom of your feet Additional Orders / Instructions: Follow Nutritious Diet WOUND #1: - Calcaneus Wound Laterality: Right Cleanser: Normal Saline (Generic) Every Other Day/15 Days Discharge Instructions: Cleanse the wound with Normal Saline prior to applying a clean dressing using gauze sponges, not tissue or cotton balls. Cleanser: Wound Cleanser Every Other Day/15 Days Discharge Instructions: Cleanse the wound with wound cleanser prior to applying a clean dressing using gauze sponges, not tissue or cotton balls. Prim Dressing: Hydrofera Blue Classic Foam, 4x4 in (Dispense As Written) Every Other Day/15 Days ary Discharge Instructions: Moisten with saline prior to applying to wound bed Secondary Dressing: Woven Gauze Sponge, Non-Sterile 4x4 in (Generic) Every Other Day/15 Days Discharge Instructions: Apply over primary dressing as  directed. Secondary Dressing: ABD Pad, 8x10 (Generic) Every Other Day/15 Days Discharge Instructions: Apply over primary dressing as directed. Secondary Dressing: Zetuvit Plus 4x8 in (Generic) Every Other Day/15 Days Discharge Instructions: Apply over primary dressing as directed. Secured With: Elastic Bandage 4 inch (ACE bandage) Every Other Day/15 Days Discharge Instructions: Secure with ACE bandage as directed. Secured With: [employer], 4.5x3.1 (in/yd) (Generic) Every Other Day/15 Days Discharge Instructions: Secure with Kerlix as directed. WOUND #2: - Calcaneus Wound Laterality: Left  Cleanser: Normal Saline (Generic) Every Other Day/15 Days Discharge Instructions: Cleanse the wound with Normal Saline prior to applying a clean dressing using gauze sponges, not tissue or cotton balls. Cleanser: Wound Cleanser Every Other Day/15 Days Discharge Instructions: Cleanse the wound with wound cleanser prior to applying a clean dressing using gauze sponges, not tissue or cotton balls. Prim Dressing: Hydrofera Blue Classic Foam, 4x4 in (Dispense As Written) Every Other Day/15 Days ary Discharge Instructions: Moisten with saline prior to applying to wound bed Secondary Dressing: Woven Gauze Sponge, Non-Sterile 4x4 in (Generic) Every Other Day/15 Days Discharge Instructions: Apply over primary dressing as directed. Secondary Dressing: ABD Pad, 8x10 (Generic) Every Other Day/15 Days Discharge Instructions: Apply over primary dressing as directed. Secondary Dressing: Zetuvit Plus 4x8 in (Generic) Every Other Day/15 Days Discharge Instructions: Apply over primary dressing as directed. Secured With: Elastic Bandage 4 inch (ACE bandage) Every Other Day/15 Days Discharge Instructions: Secure with ACE bandage as directed. Secured With: [employer], 4.5x3.1 (in/yd) (Generic) Every Other Day/15 Days Discharge Instructions: Secure with Kerlix as directed. #1 still Hydrofera Blue now with a  total contact cast on the left 2. My plan would be that if he has a nice response to total contact casting to eventually challenge of the right foot with a cast after the left foot heals. 3. No evidence of infection in either area Electronic Signature(s) Signed: 12/17/2020 3:44:17 PM By: Linton Ham MD Entered By: Linton Ham on 12/17/2020 12:37:43 -------------------------------------------------------------------------------- Advanced Modalities Screening Tool Details Patient Name: Date of Service: [name], [place] 12/17/2020 10:15 A M Medical Record Number: [id number] Patient Account Number: [id number] Date of Birth/Sex: Treating RN: 03-Jan-1976 (45 y.o. [name], [name] Primary Care Provider: [name] Other Clinician: Referring Provider: Treating Provider/Extender: [name] in Treatment: 69 T Contact Cast Applied for Wound Assessment: otal Wound #2 Left Calcaneus Performed By: Physician Ricard Dillon., MD Post Procedure Diagnosis Same as Pre-procedure Electronic Signature(s) Signed: 12/17/2020 3:44:17 PM By: Linton Ham MD Signed: 12/21/2020 6:08:57 PM By: [name] RN Entered By: [name] on 12/17/2020 11:34:15 -------------------------------------------------------------------------------- SuperBill Details Patient Name: Date of Service: [name], [place] 12/17/2020 Medical Record Number: [id number] Patient Account Number: [id number] Date of Birth/Sex: Treating RN: Apr 16, 1976 (45 y.o. [name], [name] Primary Care Provider: [name] Other Clinician: Referring Provider: Treating Provider/Extender: [name] in Treatment: 22 Diagnosis Coding ICD-10 Codes Code Description [phone] Pressure ulcer of other site, stage 3 M62.81 Muscle weakness (generalized) I10 Essential (primary) hypertension L97.512 Non-pressure chronic ulcer of other part of right foot with fat layer  exposed L97.522 Non-pressure chronic ulcer of other 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 Facility Procedures CPT4 Code: [id number] Description: [phone] - DEBRIDE WOUND 1ST 20 SQ CM OR < ICD-10 Diagnosis Description L97.522 Non-pressure chronic ulcer of other 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 Modifier: Quantity: 1 Physician Procedures : CPT4 Code Description Modifier [id number] - WC PHYS DEBR WO ANESTH 20 SQ CM ICD-10 Diagnosis Description L97.522 Non-pressure chronic ulcer of other 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 Quantity: 1 Electronic Signature(s) Signed: 12/17/2020 3:44:17 PM By: Linton Ham MD Entered By: Linton Ham on 12/17/2020 12:38:05

## 2020-12-23 NOTE — Progress Notes (Signed)
[name], [name] ([id number]) Visit Report for 12/21/2020 Arrival Information Details Patient Name: Date of Service: [name], [name] 12/21/2020 12:30 PM Medical Record Number: [id number] Patient Account Number: [id number] Date of Birth/Sex: Treating RN: 15-Oct-1975 (45 y.o. [name], [name] Primary Care [name]: [name] Other Clinician: Referring [name]: Treating [name]/Extender: [name] in Treatment: 63 Visit Information History Since Last Visit Added or deleted any medications: No Patient Arrived: Ambulatory Any new allergies or adverse reactions: No Arrival Time: 12:46 Had a fall or experienced change in No Accompanied By: mom activities of daily living that may affect Transfer Assistance: None risk of falls: Patient Identification Verified: Yes Signs or symptoms of abuse/neglect since last visito No Secondary Verification Process Completed: Yes Hospitalized since last visit: No Patient Requires Transmission-Based Precautions: No Implantable device outside of the clinic excluding No Patient Has Alerts: No cellular tissue based products placed in the center since last visit: Has Dressing in Place as Prescribed: Yes Pain Present Now: Yes Electronic Signature(s) Signed: 12/21/2020 2:51:01 PM By: [name] Entered By: [name] on 12/21/2020 12:46:28 -------------------------------------------------------------------------------- Encounter Discharge Information Details Patient Name: Date of Service: [name], [name] 12/21/2020 12:30 PM Medical Record Number: [id number] Patient Account Number: [id number] Date of Birth/Sex: Treating RN: September 25, 1975 (45 y.o. [name] Primary Care Brenetta Penny: [name] Other Clinician: Referring Krisna Omar: Treating Aragon Scarantino/Extender: [name] in Treatment: 44 Encounter Discharge Information Items Discharge Condition: Stable Ambulatory Status:  Wheelchair Discharge Destination: Home Transportation: Private Auto Accompanied By: mother Schedule Follow-up Appointment: Yes Clinical Summary of Care: Patient Declined Electronic Signature(s) Signed: 12/21/2020 4:50:33 PM By: [name] Gouty RN, BSN Entered By: [name] on 12/21/2020 13:37:06 -------------------------------------------------------------------------------- Multi Wound Chart Details Patient Name: Date of Service: [name], [place] 12/21/2020 12:30 PM Medical Record Number: [id number] Patient Account Number: [id number] Date of Birth/Sex: Treating RN: 10/18/1975 (45 y.o. [name] Primary Care [name]: [name] Other Clinician: Referring Tamar Miano: Treating Charee Tumblin/Extender: [name] in Treatment: 22 Vital Signs Height(in): 69 Pulse(bpm): 55 Weight(lbs): 280 Blood Pressure(mmHg): 161/83 Body Mass Index(BMI): 41 Temperature(F): 98.5 Respiratory Rate(breaths/min): 16 Photos: [1:No Photos Right Calcaneus] [2:No Photos Left Calcaneus] [N/A:N/A N/A] Wound Location: [1:Pressure Injury] [2:Pressure Injury] [N/A:N/A] Wounding Event: [1:Pressure Ulcer] [2:Pressure Ulcer] [N/A:N/A] Primary Etiology: [1:10/07/2019] [2:10/07/2019] [N/A:N/A] Date Acquired: [1:22] [2:22] [N/A:N/A] Weeks of Treatment: [1:Open] [2:Open] [N/A:N/A] Wound Status: [1:3.4x1.1x0.3] [2:4.6x1.6x0.3] [N/A:N/A] Measurements L x W x D (cm) [1:2.937] [2:5.781] [N/A:N/A] A (cm) : rea [1:0.881] [2:1.734] [N/A:N/A] Volume (cm) : [1:90.70%] [2:78.70%] [N/A:N/A] % Reduction in A rea: [1:97.20%] [2:93.60%] [N/A:N/A] % Reduction in Volume: [1:Category/Stage III] [2:Category/Stage III] [N/A:N/A] Classification: [1:N/A] [2:T Contact Cast otal] [N/A:N/A] Treatment Notes Wound #1 (Calcaneus) Wound Laterality: Right Cleanser Wound Cleanser Discharge Instruction: Cleanse the wound with wound cleanser prior to applying a clean dressing using gauze sponges, not tissue or  cotton balls. Normal Saline Discharge Instruction: Cleanse the wound with Normal Saline prior to applying a clean dressing using gauze sponges, not tissue or cotton balls. Peri-Wound Care Topical Primary Dressing Hydrofera Blue Classic Foam, 4x4 in Discharge Instruction: Moisten with saline prior to applying to wound bed Secondary Dressing Woven Gauze Sponge, Non-Sterile 4x4 in Discharge Instruction: Apply over primary dressing as directed. Zetuvit Plus 4x8 in Discharge Instruction: Apply over primary dressing as directed. ABD Pad, 8x10 Discharge Instruction: Apply over primary dressing as directed. Secured With Elastic Bandage 4 inch (ACE bandage) Discharge Instruction: Secure with ACE bandage as directed. Kerlix Roll Sterile,  4.5x3.1 (in/yd) Discharge Instruction: Secure with Kerlix as directed. Compression Wrap Compression Stockings Add-Ons Wound #2 (Calcaneus) Wound Laterality: Left Cleanser Wound Cleanser Discharge Instruction: Cleanse the wound with wound cleanser prior to applying a clean dressing using gauze sponges, not tissue or cotton balls. Normal Saline Discharge Instruction: Cleanse the wound with Normal Saline prior to applying a clean dressing using gauze sponges, not tissue or cotton balls. Peri-Wound Care Topical Primary Dressing Hydrofera Blue Classic Foam, 4x4 in Discharge Instruction: Moisten with saline prior to applying to wound bed Secondary Dressing Woven Gauze Sponge, Non-Sterile 4x4 in Discharge Instruction: Apply over primary dressing as directed. Zetuvit Plus 4x8 in Discharge Instruction: Apply over primary dressing as directed. ABD Pad, 8x10 Discharge Instruction: Apply over primary dressing as directed. Secured With Elastic Bandage 4 inch (ACE bandage) Discharge Instruction: Secure with ACE bandage as directed. Kerlix Roll Sterile, 4.5x3.1 (in/yd) Discharge Instruction: Secure with Kerlix as directed. Compression Wrap Compression  Stockings Add-Ons Electronic Signature(s) Signed: 12/22/2020 4:16:43 PM By: Linton Ham MD Signed: 12/23/2020 6:30:22 PM By: Levan Hurst RN, BSN Entered By: Linton Ham on 12/21/2020 13:42:49 -------------------------------------------------------------------------------- Multi-Disciplinary Care Plan Details Patient Name: Date of Service: [name], [place] 12/21/2020 12:30 PM Medical Record Number: [id number] Patient Account Number: [id number] Date of Birth/Sex: Treating RN: 01-09-1976 (45 y.o. [name] Primary Care Shyteria Lewis: [name] Other Clinician: Referring Travion Ke: Treating Gaelyn Tukes/Extender: [name] in Treatment: 49 Multidisciplinary Care Plan reviewed with physician Active Inactive Abuse / Safety / Falls / Self Care Management Nursing Diagnoses: Potential for falls Goals: Patient/caregiver will verbalize/demonstrate measures taken to prevent injury and/or falls Date Initiated: 07/15/2020 Target Resolution Date: 02/05/2021 Goal Status: Active Interventions: Assess fall risk on admission and as needed Assess impairment of mobility on admission and as needed per policy Notes: Electronic Signature(s) Signed: 12/23/2020 6:30:22 PM By: Levan Hurst RN, BSN Entered By: Levan Hurst on 12/21/2020 13:17:57 -------------------------------------------------------------------------------- Pain Assessment Details Patient Name: Date of Service: [name], [place] 12/21/2020 12:30 PM Medical Record Number: [id number] Patient Account Number: [id number] Date of Birth/Sex: Treating RN: 12-Jul-1976 (45 y.o. [name] Primary Care Hermes Wafer: [name] Other Clinician: Referring Icess Bertoni: Treating Donyel Castagnola/Extender: [name] in Treatment: 22 Active Problems Location of Pain Severity and Description of Pain Patient Has Paino Yes Site Locations Rate the pain. Current Pain Level: 3 Pain Management  and Medication Current Pain Management: Electronic Signature(s) Signed: 12/21/2020 2:51:01 PM By: [name] Signed: 12/23/2020 6:30:22 PM By: Levan Hurst RN, BSN Entered By: [name] on 12/21/2020 12:47:00 -------------------------------------------------------------------------------- Patient/Caregiver Education Details Patient Name: Date of Service: Ciro Backer 5/16/2022andnbsp12:30 PM Medical Record Number: [id number] Patient Account Number: [id number] Date of Birth/Gender: Treating RN: 03/22/1976 (45 y.o. [name] Primary Care Physician: [name] Other Clinician: Referring Physician: Treating Physician/Extender: [name] in Treatment: 22 Education Assessment Education Provided To: Patient Education Topics Provided Wound/Skin Impairment: Methods: Explain/Verbal Responses: State content correctly [employer]) Signed: 12/23/2020 6:30:22 PM By: Levan Hurst RN, BSN Entered By: Levan Hurst on 12/21/2020 13:18:20 -------------------------------------------------------------------------------- Wound Assessment Details Patient Name: Date of Service: [name], [place] 12/21/2020 12:30 PM Medical Record Number: [id number] Patient Account Number: [id number] Date of Birth/Sex: Treating RN: 1975/09/06 (45 y.o. [name] Primary Care Daria Mcmeekin: [name] Other Clinician: Referring Sahiti Joswick: Treating Zyrah Wiswell/Extender: [name] in Treatment: 22 Wound Status Wound Number: 1 Primary Etiology: Pressure Ulcer Wound Location: Right Calcaneus Wound Status: Open Wounding  Event: Pressure Injury Date Acquired: 10/07/2019 Weeks Of Treatment: 22 Clustered Wound: No Wound Measurements Length: (cm) 3.4 Width: (cm) 1.1 Depth: (cm) 0.3 Area: (cm) 2.937 Volume: (cm) 0.881 % Reduction in Area: 90.7% % Reduction in Volume: 97.2% Wound Description Classification: Category/Stage  III Treatment Notes Wound #1 (Calcaneus) Wound Laterality: Right Cleanser Wound Cleanser Discharge Instruction: Cleanse the wound with wound cleanser prior to applying a clean dressing using gauze sponges, not tissue or cotton balls. Normal Saline Discharge Instruction: Cleanse the wound with Normal Saline prior to applying a clean dressing using gauze sponges, not tissue or cotton balls. Peri-Wound Care Topical Primary Dressing Hydrofera Blue Classic Foam, 4x4 in Discharge Instruction: Moisten with saline prior to applying to wound bed Secondary Dressing Woven Gauze Sponge, Non-Sterile 4x4 in Discharge Instruction: Apply over primary dressing as directed. Zetuvit Plus 4x8 in Discharge Instruction: Apply over primary dressing as directed. ABD Pad, 8x10 Discharge Instruction: Apply over primary dressing as directed. Secured With Elastic Bandage 4 inch (ACE bandage) Discharge Instruction: Secure with ACE bandage as directed. Kerlix Roll Sterile, 4.5x3.1 (in/yd) Discharge Instruction: Secure with Kerlix as directed. Compression Wrap Compression Stockings Add-Ons Electronic Signature(s) Signed: 12/21/2020 2:51:01 PM By: [name] Signed: 12/23/2020 6:30:22 PM By: Levan Hurst RN, BSN Entered By: [name] on 12/21/2020 12:47:16 -------------------------------------------------------------------------------- Wound Assessment Details Patient Name: Date of Service: [name], [place] 12/21/2020 12:30 PM Medical Record Number: [id number] Patient Account Number: [id number] Date of Birth/Sex: Treating RN: May 16, 1976 (45 y.o. [name] Primary Care Fabion Gatson: [name] Other Clinician: Referring Kaulder Zahner: Treating Jacelyn Cuen/Extender: [name] in Treatment: 22 Wound Status Wound Number: 2 Primary Etiology: Pressure Ulcer Wound Location: Left Calcaneus Wound Status: Open Wounding Event: Pressure Injury Date Acquired: 10/07/2019 Weeks  Of Treatment: 22 Clustered Wound: No Wound Measurements Length: (cm) 4.6 Width: (cm) 1.6 Depth: (cm) 0.3 Area: (cm) 5.781 Volume: (cm) 1.734 % Reduction in Area: 78.7% % Reduction in Volume: 93.6% Wound Description Classification: Category/Stage III Treatment Notes Wound #2 (Calcaneus) Wound Laterality: Left Cleanser Wound Cleanser Discharge Instruction: Cleanse the wound with wound cleanser prior to applying a clean dressing using gauze sponges, not tissue or cotton balls. Normal Saline Discharge Instruction: Cleanse the wound with Normal Saline prior to applying a clean dressing using gauze sponges, not tissue or cotton balls. Peri-Wound Care Topical Primary Dressing Hydrofera Blue Classic Foam, 4x4 in Discharge Instruction: Moisten with saline prior to applying to wound bed Secondary Dressing Woven Gauze Sponge, Non-Sterile 4x4 in Discharge Instruction: Apply over primary dressing as directed. Zetuvit Plus 4x8 in Discharge Instruction: Apply over primary dressing as directed. ABD Pad, 8x10 Discharge Instruction: Apply over primary dressing as directed. Secured With Elastic Bandage 4 inch (ACE bandage) Discharge Instruction: Secure with ACE bandage as directed. Kerlix Roll Sterile, 4.5x3.1 (in/yd) Discharge Instruction: Secure with Kerlix as directed. Compression Wrap Compression Stockings Add-Ons Electronic Signature(s) Signed: 12/21/2020 2:51:01 PM By: [name] Signed: 12/23/2020 6:30:22 PM By: Levan Hurst RN, BSN Entered By: [name] on 12/21/2020 12:47:16 -------------------------------------------------------------------------------- [place] Details Patient Name: Date of Service: [name], [place] 12/21/2020 12:30 PM Medical Record Number: [id number] Patient Account Number: [id number] Date of Birth/Sex: Treating RN: 1975-10-14 (45 y.o. [name] Primary Care Marvene Strohm: [name] Other Clinician: Referring Addalee Kavanagh: Treating  Maicie Vanderloop/Extender: [name] in Treatment: 22 Vital Signs Time Taken: 12:46 Temperature (F): 98.5 Height (in): 69 Pulse (bpm): 87 Weight (lbs): 280 Respiratory Rate (breaths/min): 16 Body Mass Index (BMI): 41.3 Blood Pressure (mmHg):  161/83 Reference Range: 80 - 120 mg / dl Electronic Signature(s) Signed: 12/21/2020 2:51:01 PM By: [name] Entered By: [name] on 12/21/2020 12:46:50

## 2020-12-23 NOTE — Progress Notes (Signed)
[name] ([id number]) Visit Report for 12/21/2020 HPI Details Patient Name: Date of Service: [name], [place] 12/21/2020 12:30 PM Medical Record Number: [id number] Patient Account Number: [id number] Date of Birth/Sex: Treating RN: [birth year] (45 y.o. [name] Primary Care Provider: [name] Other Clinician: Referring Provider: Treating Provider/Extender: [name] in Treatment: 22 History of Present Illness HPI Description: 07/15/2020 upon evaluation today patient presents for initial inspection here in our clinic concerning issues he has been having with the bottoms of his feet bilaterally. He states these actually occurred as wounds when he was hospitalized for 5 months secondary to Covid. He was apparently with tilting bed where he was in an upright position quite frequently and apparently this occurred in some way shape or form during that time. Fortunately there is no sign of active infection at this time. No fevers, chills, nausea, vomiting, or diarrhea. With that being said he still has substantial wounds on the plantar aspects of his feet Theragen require quite a bit of work to get these to heal. He has been using Santyl currently though that is been problematic both in receiving the medication as well as actually paid for it as it is become quite expensive. Prior to the experience with Covid the patient really did not have any major medical problems other than hypertension he does have some mild generalized weakness following the Covid experience. 07/22/2020 on evaluation today patient appears to be doing okay in regard to his foot ulcers I feel like the wound beds are showing signs of better improvement that I do believe the Iodoflex is helping in this regard. With that being said he does have a lot of drainage currently and this is somewhat blue/green in nature which is consistent with Pseudomonas. I do think a culture today would be  appropriate for [place] to evaluate and see if that is indeed the case I would likely start him on antibiotic orally as well he is not allergic to Cipro knows of no issues he has had in the past 12/21; patient was admitted to the clinic earlier this month with bilateral presumed pressure ulcers on the bottom of his feet apparently related to excessive pressure from a tilt table arrangement in the intensive care unit. Patient relates this to being on ECMO but I am not really sure that is exactly related to that. I must say I have never seen anything like this. He has fairly extensive full-thickness wounds extending from his heel towards his midfoot mostly centered laterally. There is already been some healing distally. He does not appear to have an arterial issue. He has been using gentamicin to the wound surfaces with Iodoflex to help with ongoing debridement 1/6; this is a patient with pressure ulcers on the bottom of his feet related to excessive pressure from a standing position in the intensive care unit. He is complaining of a lot of pain in the right heel. He is not a diabetic. 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 We have been using Iodoflex to help prepare the surfaces of both wounds for an advanced treatment product. He is nonambulatory spending most of his time in a wheelchair I have asked him not to propel the wheelchair with his heels 1/13; in general his wounds look better not much surface area change we have been using Iodoflex as of last week. I did an x-ray of the right heel as the patient was complaining of pain. I had  some thoughts about a stress fracture perhaps Achilles tendon problems however what it showed was erosive changes along the inferior aspect of the calcaneus he now has a MRI booked for 1/20. 1/20; in general his wounds continue to be better. Some improvement in the large narrow areas proximally in his foot. He is still complaining of pain in the  right heel and tenderness in certain areas of this wound. His MRI is tonight. I am not just looking for osteomyelitis that was brought up on the x-ray I am wondering about stress fractures, tendon ruptures etc. He has no such findings on the left. Also noteworthy is that the patient had critical illness neuropathy and some of the discomfort may be actual improvement in nerve function I am just not sure. These wounds were initially in the setting of severe critical illness related to COVID-19. He was put in a standing position. He may have also been on pressors at the point contributing to tissue ischemia. By his description at some point these wounds were grossly necrotic extending proximally up into the Achilles part of his heel. I do not know that I have ever really seen pictures of them like this although they may exist in epic We have ordered Tri layer Oasis. I am trying to stimulate some granulation in these areas. This is of course assuming the MRI is negative for infection 1/27; since the patient was last here he saw Dr. Juleen China of infectious disease. He is planned for vancomycin and ceftriaxone. Prior operative culture grew MSSA. Also ordered baseline lab work. He also ordered arterial studies although the ABIs in our clinic were normal as well as his clinical exam these were normal I do not think he needs to see vascular surgery. His ABIs at the PTA were 1.22 in the right triphasic waveforms with a normal TBI of 1.15 on the left ABI of 1.22 with triphasic waveforms and a normal TBI of 1.08. Finally he saw Dr. Amalia Hailey who will follow him in for 2 months. At this point I do not think he felt that he needed a procedure on the right calcaneal bone. Dr. Juleen China is elected for broad-spectrum antibiotic The patient is still having pain in the right heel. He walks with a walker 2/3; wounds are generally smaller. He is tolerating his IV antibiotics. I believe this is vancomycin and ceftriaxone. We are  still waiting for Oasis burn in terms of his out-of-pocket max which he should be meeting soon given the IV antibiotics, MRIs etc. I have asked him to check in on this. We are using silver collagen in the meantime the wounds look better 2/10; tolerating IV vancomycin and Rocephin. We are waiting to apply for Oasis. Although I am not really sure where he is in his out-of-pocket max. 2/17 started the first application of Oasis trilayer. Still on antibiotics. The wounds have generally look better. The area on the left has a little more surface slough requiring debridement 1/49; second application of Oasis trilayer. The wound surface granulation is generally look better. The area on the left with undermining laterally I think is come in a bit. 10/08/2020 upon evaluation today patient is here today for [employer] application #3. Fortunately he seems to be doing extremely well with regard to this and we are seeing a lot of new epithelial growth which is great news. Fortunately there is no signs of active infection at this time. 10/16/2020 upon evaluation today patient appears to be doing well with regard to  his foot ulcers. Do believe the Oasis has been of benefit for him. I do not see any signs of infection right now which is great news and I think that he has a lot of new epithelial growth which is great to see as well. The patient is very pleased to hear all of this. I do think we can proceed with the Oasis trilayer #4 today. 3/18; not as much improvement in these areas on his heels that I was hoping. I did reapply trilateral Oasis today the tissue looks healthier but not as much fill in as I was hoping. 3/25; better looking today I think this is come in a bit the tissue looks healthier. Triple layer Oasis reapplied #6 4/1; somewhat better looking definitely better looking surface not as much change in surface area as I was hoping. He may be spending more time [place] on days then he needs to although  he does have heel offloading boots. Triple layer Oasis reapplied #7 4/7; unfortunately apparently [facility] will not approve any further Oasis which is unfortunate since the patient did respond nicely both in terms of the condition of the wound bed as well as surface area. There is still some drainage coming from the wound but not a lot there does not appear to be any infection 4/15; we have been using Hydrofera Blue. He continues to have nice rims of epithelialization on the right greater than the left. The left the epithelialization is coming from the tip of his heel. There is moderate drainage. In this that concerns me about a total contact cast. There is no evidence of infection 4/29; patient has been using Hydrofera Blue with dressing changes. He has no complaints or issues today. 5/5; using Hydrofera Blue. I actually think that he looks marginally better than the last time I saw this 3 weeks ago. There are rims of epithelialization on the left thumb coming from the medial side on the right. Using Hydrofera Blue 5/12; using Hydrofera Blue. These continue to make improvements in surface area. His drainage was not listed as severe I therefore went ahead and put a cast on the left foot. Right foot we will continue to dress his previous 5/16; back for first total contact cast change. He did not tolerate this particularly well cast injury on the anterior tibia among other issues. Difficulty sleeping. I talked him about this in some detail and afterwards is elected to continue. I told him I would like to have a cast on for 3 weeks to see if this is going to help at all. I think he agreed Electronic Signature(s) Signed: 12/22/2020 4:16:43 PM By: Linton Ham MD Entered By: Linton Ham on 12/21/2020 13:43:52 -------------------------------------------------------------------------------- Physical Exam Details Patient Name: Date of Service: [name], [place] 12/21/2020 12:30  PM Medical Record Number: [id number] Patient Account Number: [id number] Date of Birth/Sex: Treating RN: 14-Mar-1976 (44 y.o. [name] Primary Care Provider: [name] Other Clinician: Referring Provider: Treating Provider/Extender: [name] in Treatment: 82 Constitutional Patient is hypertensive.. Pulse regular and within target range for patient.Marland Kitchen Respirations regular, non-labored and within target range.. Temperature is normal and within the target range for the patient.Marland Kitchen Appears in no distress. Notes Wound exam; left and right plantar foot wounds. We put a cast on the left.. Electronic Signature(s) Signed: 12/22/2020 4:16:43 PM By: Linton Ham MD Entered By: Linton Ham on 12/21/2020 13:44:27 -------------------------------------------------------------------------------- Physician Orders Details Patient Name: Date of Service: [name], [place]  [place] 12/21/2020 12:30 PM Medical Record Number: [id number] Patient Account Number: [id number] Date of Birth/Sex: Treating RN: 01-Sep-1975 (45 y.o. 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 ****60 minutes for cast changes**** Return A Bathing/ Shower/ Hygiene May shower with protection but do not get wound dressing(s) wet. Edema Control - Lymphedema / SCD / Other Bilateral Lower Extremities Elevate legs to the level of the heart or above for 30 minutes  daily and/or when sitting, a frequency of: Avoid standing for long periods of time. Off-Loading Total Contact Cast to Left Lower Extremity Wedge shoe to: - Gloped Shoe to bilateral feet Other: - keep pressure off of the bottom of your feet Additional Orders / Instructions Follow Nutritious Diet Wound Treatment Wound #1 - Calcaneus Wound Laterality: Right Cleanser: Normal Saline (Generic) Every Other Day/15 Days Discharge Instructions: Cleanse the wound with Normal Saline prior to applying a clean dressing using gauze sponges, not tissue or cotton balls. Cleanser: Wound Cleanser Every Other Day/15 Days Discharge Instructions: Cleanse the wound with wound cleanser prior to applying a clean dressing using gauze sponges, not tissue or cotton balls. Prim Dressing: Hydrofera Blue Classic Foam, 4x4 in (Dispense As Written) Every Other Day/15 Days ary Discharge Instructions: Moisten with saline prior to applying to wound bed Secondary Dressing: Woven Gauze Sponge, Non-Sterile 4x4 in (Generic) Every Other Day/15 Days Discharge Instructions: Apply over primary dressing as directed. Secondary Dressing: ABD Pad, 8x10 (Generic) Every Other Day/15 Days Discharge Instructions: Apply over primary dressing as directed. Secondary Dressing: Zetuvit Plus 4x8 in (Generic) Every Other Day/15 Days Discharge Instructions: Apply over primary dressing as directed. Secured With: Elastic Bandage 4 inch (ACE bandage) Every Other Day/15 Days Discharge Instructions: Secure with ACE bandage as directed. Secured With: [employer], 4.5x3.1 (in/yd) (Generic) Every Other Day/15 Days Discharge Instructions: Secure with Kerlix as directed. Wound #2 - Calcaneus Wound Laterality: Left Cleanser: Normal Saline (Generic) Every Other Day/15 Days Discharge Instructions: Cleanse the wound with Normal Saline prior to applying a clean dressing using gauze sponges, not tissue or cotton balls. Cleanser: Wound Cleanser Every  Other Day/15 Days Discharge Instructions: Cleanse the wound with wound cleanser prior to applying a clean dressing using gauze sponges, not tissue or cotton balls. Prim Dressing: Hydrofera Blue Classic Foam, 4x4 in (Dispense As Written) Every Other Day/15 Days ary Discharge Instructions: Moisten with saline prior to applying to wound bed Secondary Dressing: Woven Gauze Sponge, Non-Sterile 4x4 in (Generic) Every Other Day/15 Days Discharge Instructions: Apply over primary dressing as directed. Secondary Dressing: ABD Pad, 8x10 (Generic) Every Other Day/15 Days Discharge Instructions: Apply over primary dressing as directed. Secondary Dressing: Zetuvit Plus 4x8 in (Generic) Every Other Day/15 Days Discharge Instructions: Apply over primary dressing as directed. Secured With: Elastic Bandage 4 inch (ACE bandage) Every Other Day/15 Days Discharge Instructions: Secure with ACE bandage as directed. Secured With: [employer], 4.5x3.1 (in/yd) (Generic) Every Other Day/15 Days Discharge Instructions: Secure with Kerlix as directed. Electronic Signature(s) Signed: 12/22/2020 4:16:43 PM By: Linton Ham MD Signed: 12/23/2020 6:30:22 PM By:  Levan Hurst RN, BSN Entered By: Levan Hurst on 12/21/2020 13:17:06 -------------------------------------------------------------------------------- Problem List Details Patient Name: Date of Service: [name], [place] 12/21/2020 12:30 PM Medical Record Number: [id number] Patient Account Number: [id number] Date of Birth/Sex: Treating RN: May 18, 1976 (45 y.o. 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 09/03/2020 No Yes L97.522 Non-pressure chronic ulcer of other 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 09/03/2020 No Yes Inactive Problems ICD-10 Code Description Active Date Inactive Date M86.171 Other acute osteomyelitis, right ankle and foot 09/03/2020 09/03/2020 Resolved Problems Electronic Signature(s) Signed: 12/22/2020 4:16:43 PM By: Linton Ham MD Entered By: Linton Ham on 12/21/2020 13:42:41 -------------------------------------------------------------------------------- Progress Note Details Patient Name: Date of Service: [name], [place] 12/21/2020 12:30 PM Medical Record Number: [id number] Patient Account Number: [id number] Date of Birth/Sex: Treating RN: [birth year] (45 y.o. [name] Primary Care Provider: [name] Other Clinician: Referring Provider: Treating Provider/Extender: [name] in Treatment: 22 Subjective History of Present Illness (HPI) 07/15/2020 upon evaluation today patient presents for initial inspection here in our clinic concerning issues he has been having with the bottoms of his feet bilaterally. He states these actually occurred as wounds when he was hospitalized for 5 months secondary to Covid. He was apparently with tilting bed where he was in an upright position quite frequently and apparently this occurred in some way shape or form during that time. Fortunately there is no sign of active infection at this time. No fevers, chills, nausea, vomiting, or diarrhea. With that being said he still has substantial wounds on the plantar aspects of his feet Theragen require quite a bit of work to get these to heal. He has been using Santyl currently though that is been problematic both in receiving the medication as well as actually paid for it as it is become quite expensive. Prior to the experience with  Covid the patient really did not have any major medical problems other than hypertension he does have some mild generalized weakness following the Covid experience. 07/22/2020 on evaluation today patient appears to be doing okay in regard to his foot ulcers I feel like the wound beds are showing signs of better improvement that I do believe the Iodoflex is helping in this regard. With that being said he does have a lot of drainage currently and this is somewhat blue/green in nature which is consistent with Pseudomonas. I do think a culture today would be appropriate for [place] to evaluate and see if that is indeed the case I would likely start him on antibiotic orally as well he is not allergic to Cipro knows of no issues he has had in the past 12/21; patient was admitted to the clinic earlier this month with bilateral presumed pressure ulcers on the bottom of his feet apparently related to excessive pressure from a tilt table arrangement in the intensive care unit. Patient relates this to being on ECMO but I am not really sure that is exactly related to that. I must say I have never seen anything like this. He has fairly extensive full-thickness wounds extending from his heel towards his midfoot  mostly centered laterally. There is already been some healing distally. He does not appear to have an arterial issue. He has been using gentamicin to the wound surfaces with Iodoflex to help with ongoing debridement 1/6; this is a patient with pressure ulcers on the bottom of his feet related to excessive pressure from a standing position in the intensive care unit. He is complaining of a lot of pain in the right heel. He is not a diabetic. 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 We have been using Iodoflex to help prepare the surfaces of both wounds for an advanced treatment product. He is nonambulatory spending most of his time in a wheelchair I have asked him not to propel the  wheelchair with his heels 1/13; in general his wounds look better not much surface area change we have been using Iodoflex as of last week. I did an x-ray of the right heel as the patient was complaining of pain. I had some thoughts about a stress fracture perhaps Achilles tendon problems however what it showed was erosive changes along the inferior aspect of the calcaneus he now has a MRI booked for 1/20. 1/20; in general his wounds continue to be better. Some improvement in the large narrow areas proximally in his foot. He is still complaining of pain in the right heel and tenderness in certain areas of this wound. His MRI is tonight. I am not just looking for osteomyelitis that was brought up on the x-ray I am wondering about stress fractures, tendon ruptures etc. He has no such findings on the left. Also noteworthy is that the patient had critical illness neuropathy and some of the discomfort may be actual improvement in nerve function I am just not sure. These wounds were initially in the setting of severe critical illness related to COVID-19. He was put in a standing position. He may have also been on pressors at the point contributing to tissue ischemia. By his description at some point these wounds were grossly necrotic extending proximally up into the Achilles part of his heel. I do not know that I have ever really seen pictures of them like this although they may exist in epic We have ordered Tri layer Oasis. I am trying to stimulate some granulation in these areas. This is of course assuming the MRI is negative for infection 1/27; since the patient was last here he saw Dr. Juleen China of infectious disease. He is planned for vancomycin and ceftriaxone. Prior operative culture grew MSSA. Also ordered baseline lab work. He also ordered arterial studies although the ABIs in our clinic were normal as well as his clinical exam these were normal I do not think he needs to see vascular surgery. His  ABIs at the PTA were 1.22 in the right triphasic waveforms with a normal TBI of 1.15 on the left ABI of 1.22 with triphasic waveforms and a normal TBI of 1.08. Finally he saw Dr. Amalia Hailey who will follow him in for 2 months. At this point I do not think he felt that he needed a procedure on the right calcaneal bone. Dr. Juleen China is elected for broad-spectrum antibiotic The patient is still having pain in the right heel. He walks with a walker 2/3; wounds are generally smaller. He is tolerating his IV antibiotics. I believe this is vancomycin and ceftriaxone. We are still waiting for Oasis burn in terms of his out-of-pocket max which he should be meeting soon given the IV antibiotics, MRIs etc. I  have asked him to check in on this. We are using silver collagen in the meantime the wounds look better 2/10; tolerating IV vancomycin and Rocephin. We are waiting to apply for Oasis. Although I am not really sure where he is in his out-of-pocket max. 2/17 started the first application of Oasis trilayer. Still on antibiotics. The wounds have generally look better. The area on the left has a little more surface slough requiring debridement 8/76; second application of Oasis trilayer. The wound surface granulation is generally look better. The area on the left with undermining laterally I think is come in a bit. 10/08/2020 upon evaluation today patient is here today for [employer] application #3. Fortunately he seems to be doing extremely well with regard to this and we are seeing a lot of new epithelial growth which is great news. Fortunately there is no signs of active infection at this time. 10/16/2020 upon evaluation today patient appears to be doing well with regard to his foot ulcers. Do believe the Oasis has been of benefit for him. I do not see any signs of infection right now which is great news and I think that he has a lot of new epithelial growth which is great to see as well. The patient is  very pleased to hear all of this. I do think we can proceed with the Oasis trilayer #4 today. 3/18; not as much improvement in these areas on his heels that I was hoping. I did reapply trilateral Oasis today the tissue looks healthier but not as much fill in as I was hoping. 3/25; better looking today I think this is come in a bit the tissue looks healthier. Triple layer Oasis reapplied #6 4/1; somewhat better looking definitely better looking surface not as much change in surface area as I was hoping. He may be spending more time [place] on days then he needs to although he does have heel offloading boots. Triple layer Oasis reapplied #7 4/7; unfortunately apparently [facility] will not approve any further Oasis which is unfortunate since the patient did respond nicely both in terms of the condition of the wound bed as well as surface area. There is still some drainage coming from the wound but not a lot there does not appear to be any infection 4/15; we have been using Hydrofera Blue. He continues to have nice rims of epithelialization on the right greater than the left. The left the epithelialization is coming from the tip of his heel. There is moderate drainage. In this that concerns me about a total contact cast. There is no evidence of infection 4/29; patient has been using Hydrofera Blue with dressing changes. He has no complaints or issues today. 5/5; using Hydrofera Blue. I actually think that he looks marginally better than the last time I saw this 3 weeks ago. There are rims of epithelialization on the left thumb coming from the medial side on the right. Using Hydrofera Blue 5/12; using Hydrofera Blue. These continue to make improvements in surface area. His drainage was not listed as severe I therefore went ahead and put a cast on the left foot. Right foot we will continue to dress his previous 5/16; back for first total contact cast change. He did not tolerate this  particularly well cast injury on the anterior tibia among other issues. Difficulty sleeping. I talked him about this in some detail and afterwards is elected to continue. I told him I would like to have a cast on  for 3 weeks to see if this is going to help at all. I think he agreed Objective Constitutional Patient is hypertensive.. Pulse regular and within target range for patient.Marland Kitchen Respirations regular, non-labored and within target range.. Temperature is normal and within the target range for the patient.Marland Kitchen Appears in no distress. Vitals Time Taken: 12:46 PM, Height: 69 in, Weight: 280 lbs, BMI: 41.3, Temperature: 98.5 F, Pulse: 87 bpm, Respiratory Rate: 16 breaths/min, Blood Pressure: 161/83 mmHg. General Notes: Wound exam; left and right plantar foot wounds. We put a cast on the left.. Integumentary (Hair, Skin) Wound #1 status is Open. Original cause of wound was Pressure Injury. The date acquired was: 10/07/2019. The wound has been in treatment 22 weeks. The wound is located on the Right Calcaneus. The wound measures 3.4cm length x 1.1cm width x 0.3cm depth; 2.937cm^2 area and 0.881cm^3 volume. Wound #2 status is Open. Original cause of wound was Pressure Injury. The date acquired was: 10/07/2019. The wound has been in treatment 22 weeks. The wound is located on the Left Calcaneus. The wound measures 4.6cm length x 1.6cm width x 0.3cm depth; 5.781cm^2 area and 1.734cm^3 volume. Assessment Active Problems ICD-10 Pressure ulcer of other site, stage 3 Muscle weakness (generalized) Essential (primary) hypertension Non-pressure chronic ulcer of other part of right foot with fat layer exposed Non-pressure chronic ulcer of other 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 Procedures Wound #2 Pre-procedure diagnosis of Wound #2 is a Pressure Ulcer located on the Left Calcaneus . There was a T Contact Cast Procedure by Deno Etienne MD. Post procedure Diagnosis Wound #2: Same as  Pre-Procedure Plan Follow-up Appointments: Return Appointment in: - Thursday for [facility] appt. ****60 minutes for cast changes**** Bathing/ Shower/ Hygiene: May shower with protection but do not get wound dressing(s) wet. Edema Control - Lymphedema / SCD / Other: Elevate legs to the level of the heart or above for 30 minutes daily and/or when sitting, a frequency of: Avoid standing for long periods of time. Off-Loading: T Contact Cast to Left Lower Extremity otal Wedge shoe to: - Gloped Shoe to bilateral feet Other: - keep pressure off of the bottom of your feet Additional Orders / Instructions: Follow Nutritious Diet WOUND #1: - Calcaneus Wound Laterality: Right Cleanser: Normal Saline (Generic) Every Other Day/15 Days Discharge Instructions: Cleanse the wound with Normal Saline prior to applying a clean dressing using gauze sponges, not tissue or cotton balls. Cleanser: Wound Cleanser Every Other Day/15 Days Discharge Instructions: Cleanse the wound with wound cleanser prior to applying a clean dressing using gauze sponges, not tissue or cotton balls. Prim Dressing: Hydrofera Blue Classic Foam, 4x4 in (Dispense As Written) Every Other Day/15 Days ary Discharge Instructions: Moisten with saline prior to applying to wound bed Secondary Dressing: Woven Gauze Sponge, Non-Sterile 4x4 in (Generic) Every Other Day/15 Days Discharge Instructions: Apply over primary dressing as directed. Secondary Dressing: ABD Pad, 8x10 (Generic) Every Other Day/15 Days Discharge Instructions: Apply over primary dressing as directed. Secondary Dressing: Zetuvit Plus 4x8 in (Generic) Every Other Day/15 Days Discharge Instructions: Apply over primary dressing as directed. Secured With: Elastic Bandage 4 inch (ACE bandage) Every Other Day/15 Days Discharge Instructions: Secure with ACE bandage as directed. Secured With: [employer], 4.5x3.1 (in/yd) (Generic) Every Other Day/15 Days Discharge Instructions:  Secure with Kerlix as directed. WOUND #2: - Calcaneus Wound Laterality: Left Cleanser: Normal Saline (Generic) Every Other Day/15 Days Discharge Instructions: Cleanse the wound with Normal Saline prior to applying a clean  dressing using gauze sponges, not tissue or cotton balls. Cleanser: Wound Cleanser Every Other Day/15 Days Discharge Instructions: Cleanse the wound with wound cleanser prior to applying a clean dressing using gauze sponges, not tissue or cotton balls. Prim Dressing: Hydrofera Blue Classic Foam, 4x4 in (Dispense As Written) Every Other Day/15 Days ary Discharge Instructions: Moisten with saline prior to applying to wound bed Secondary Dressing: Woven Gauze Sponge, Non-Sterile 4x4 in (Generic) Every Other Day/15 Days Discharge Instructions: Apply over primary dressing as directed. Secondary Dressing: ABD Pad, 8x10 (Generic) Every Other Day/15 Days Discharge Instructions: Apply over primary dressing as directed. Secondary Dressing: Zetuvit Plus 4x8 in (Generic) Every Other Day/15 Days Discharge Instructions: Apply over primary dressing as directed. Secured With: Elastic Bandage 4 inch (ACE bandage) Every Other Day/15 Days Discharge Instructions: Secure with ACE bandage as directed. Secured With: [employer], 4.5x3.1 (in/yd) (Generic) Every Other Day/15 Days Discharge Instructions: Secure with Kerlix as directed. 1. Back for the obligatory first total contact cast change. After discussing it with the patient he agreed to proceed and we changed the cast without incident. Padding the area on the left anterior tibia superiorly Electronic Signature(s) Signed: 12/22/2020 4:16:43 PM By: Linton Ham MD Entered By: Linton Ham on 12/21/2020 13:45:05 -------------------------------------------------------------------------------- Total Contact Cast Details Patient Name: Date of Service: [name], [place] 12/21/2020 12:30 PM Medical Record Number: [id number] Patient  Account Number: [id number] Date of Birth/Sex: Treating RN: 1975-08-31 (45 y.o. [name] Primary Care Provider: [name] Other Clinician: Referring Provider: Treating Provider/Extender: [name] in Treatment: 58 T Contact Cast Applied for Wound Assessment: otal Wound #2 Left Calcaneus Performed By: Physician Ricard Dillon., MD Post Procedure Diagnosis Same as Pre-procedure Electronic Signature(s) Signed: 12/22/2020 4:16:43 PM By: Linton Ham MD Entered By: Linton Ham on 12/21/2020 13:45:50 -------------------------------------------------------------------------------- SuperBill Details Patient Name: Date of Service: [name], [place] 12/21/2020 Medical Record Number: [id number] Patient Account Number: [id number] Date of Birth/Sex: Treating RN: 12/28/1975 (45 y.o. [name] Primary Care Provider: [name] Other Clinician: Referring Provider: Treating Provider/Extender: [name] in Treatment: 22 Diagnosis Coding ICD-10 Codes Code Description [phone] Pressure ulcer of other site, stage 3 M62.81 Muscle weakness (generalized) I10 Essential (primary) hypertension L97.512 Non-pressure chronic ulcer of other part of right foot with fat layer exposed L97.522 Non-pressure chronic ulcer of other 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 Facility Procedures CPT4 Code: [id number] Description: [phone] - APPLY TOTAL CONTACT LEG CAST ICD-10 Diagnosis Description L97.522 Non-pressure chronic ulcer of other 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 Modifier: Quantity: 1 Physician Procedures : CPT4 Code Description Modifier [id number] - WC PHYS APPLY TOTAL CONTACT CAST ICD-10 Diagnosis Description L97.522 Non-pressure chronic ulcer of other 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 Quantity: 1 Electronic Signature(s) Signed: 12/22/2020 4:16:43 PM By: Linton Ham MD Entered By: Linton Ham on 12/21/2020  13:45:35

## 2020-12-24 ENCOUNTER — Encounter (HOSPITAL_BASED_OUTPATIENT_CLINIC_OR_DEPARTMENT_OTHER): Payer: BC Managed Care – PPO | Admitting: Internal Medicine

## 2020-12-24 ENCOUNTER — Other Ambulatory Visit: Payer: Self-pay

## 2020-12-24 DIAGNOSIS — L89893 Pressure ulcer of other site, stage 3: Secondary | ICD-10-CM | POA: Diagnosis not present

## 2020-12-24 DIAGNOSIS — L97522 Non-pressure chronic ulcer of other 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: Secondary | ICD-10-CM | POA: Diagnosis not present

## 2020-12-24 DIAGNOSIS — Z8616 Personal history of COVID-19: Secondary | ICD-10-CM | POA: Diagnosis not present

## 2020-12-24 DIAGNOSIS — L89620 Pressure ulcer of left heel, unstageable: Secondary | ICD-10-CM | POA: Diagnosis not present

## 2020-12-24 DIAGNOSIS — L97512 Non-pressure chronic ulcer of other part of right foot with fat layer exposed: Secondary | ICD-10-CM | POA: Diagnosis not present

## 2020-12-24 NOTE — Progress Notes (Signed)
[name] ([id number]) Visit Report for 12/24/2020 HPI Details Patient Name: Date of Service: [name], [place] 12/24/2020 10:30 A M Medical Record Number: [id number] Patient Account Number: [id number] Date of Birth/Sex: Treating RN: Jan 08, 1976 (45 y.o. M) Primary Care Provider: [name] Other Clinician: Referring Provider: Treating Provider/Extender: [name] in Treatment: 23 History of Present Illness HPI Description: 07/15/2020 upon evaluation today patient presents for initial inspection here in our clinic concerning issues he has been having with the bottoms of his feet bilaterally. He states these actually occurred as wounds when he was hospitalized for 5 months secondary to Covid. He was apparently with tilting bed where he was in an upright position quite frequently and apparently this occurred in some way shape or form during that time. Fortunately there is no sign of active infection at this time. No fevers, chills, nausea, vomiting, or diarrhea. With that being said he still has substantial wounds on the plantar aspects of his feet Theragen require quite a bit of work to get these to heal. He has been using Santyl currently though that is been problematic both in receiving the medication as well as actually paid for it as it is become quite expensive. Prior to the experience with Covid the patient really did not have any major medical problems other than hypertension he does have some mild generalized weakness following the Covid experience. 07/22/2020 on evaluation today patient appears to be doing okay in regard to his foot ulcers I feel like the wound beds are showing signs of better improvement that I do believe the Iodoflex is helping in this regard. With that being said he does have a lot of drainage currently and this is somewhat blue/green in nature which is consistent with Pseudomonas. I do think a culture today would be appropriate for [place] to  evaluate and see if that is indeed the case I would likely start him on antibiotic orally as well he is not allergic to Cipro knows of no issues he has had in the past 12/21; patient was admitted to the clinic earlier this month with bilateral presumed pressure ulcers on the bottom of his feet apparently related to excessive pressure from a tilt table arrangement in the intensive care unit. Patient relates this to being on ECMO but I am not really sure that is exactly related to that. I must say I have never seen anything like this. He has fairly extensive full-thickness wounds extending from his heel towards his midfoot mostly centered laterally. There is already been some healing distally. He does not appear to have an arterial issue. He has been using gentamicin to the wound surfaces with Iodoflex to help with ongoing debridement 1/6; this is a patient with pressure ulcers on the bottom of his feet related to excessive pressure from a standing position in the intensive care unit. He is complaining of a lot of pain in the right heel. He is not a diabetic. 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 We have been using Iodoflex to help prepare the surfaces of both wounds for an advanced treatment product. He is nonambulatory spending most of his time in a wheelchair I have asked him not to propel the wheelchair with his heels 1/13; in general his wounds look better not much surface area change we have been using Iodoflex as of last week. I did an x-ray of the right heel as the patient was complaining of pain. I had some  thoughts about a stress fracture perhaps Achilles tendon problems however what it showed was erosive changes along the inferior aspect of the calcaneus he now has a MRI booked for 1/20. 1/20; in general his wounds continue to be better. Some improvement in the large narrow areas proximally in his foot. He is still complaining of pain in the right heel and  tenderness in certain areas of this wound. His MRI is tonight. I am not just looking for osteomyelitis that was brought up on the x-ray I am wondering about stress fractures, tendon ruptures etc. He has no such findings on the left. Also noteworthy is that the patient had critical illness neuropathy and some of the discomfort may be actual improvement in nerve function I am just not sure. These wounds were initially in the setting of severe critical illness related to COVID-19. He was put in a standing position. He may have also been on pressors at the point contributing to tissue ischemia. By his description at some point these wounds were grossly necrotic extending proximally up into the Achilles part of his heel. I do not know that I have ever really seen pictures of them like this although they may exist in epic We have ordered Tri layer Oasis. I am trying to stimulate some granulation in these areas. This is of course assuming the MRI is negative for infection 1/27; since the patient was last here he saw Dr. Juleen China of infectious disease. He is planned for vancomycin and ceftriaxone. Prior operative culture grew MSSA. Also ordered baseline lab work. He also ordered arterial studies although the ABIs in our clinic were normal as well as his clinical exam these were normal I do not think he needs to see vascular surgery. His ABIs at the PTA were 1.22 in the right triphasic waveforms with a normal TBI of 1.15 on the left ABI of 1.22 with triphasic waveforms and a normal TBI of 1.08. Finally he saw Dr. Amalia Hailey who will follow him in for 2 months. At this point I do not think he felt that he needed a procedure on the right calcaneal bone. Dr. Juleen China is elected for broad-spectrum antibiotic The patient is still having pain in the right heel. He walks with a walker 2/3; wounds are generally smaller. He is tolerating his IV antibiotics. I believe this is vancomycin and ceftriaxone. We are still waiting  for Oasis burn in terms of his out-of-pocket max which he should be meeting soon given the IV antibiotics, MRIs etc. I have asked him to check in on this. We are using silver collagen in the meantime the wounds look better 2/10; tolerating IV vancomycin and Rocephin. We are waiting to apply for Oasis. Although I am not really sure where he is in his out-of-pocket max. 2/17 started the first application of Oasis trilayer. Still on antibiotics. The wounds have generally look better. The area on the left has a little more surface slough requiring debridement 7/06; second application of Oasis trilayer. The wound surface granulation is generally look better. The area on the left with undermining laterally I think is come in a bit. 10/08/2020 upon evaluation today patient is here today for [employer] application #3. Fortunately he seems to be doing extremely well with regard to this and we are seeing a lot of new epithelial growth which is great news. Fortunately there is no signs of active infection at this time. 10/16/2020 upon evaluation today patient appears to be doing well with regard to his  foot ulcers. Do believe the Oasis has been of benefit for him. I do not see any signs of infection right now which is great news and I think that he has a lot of new epithelial growth which is great to see as well. The patient is very pleased to hear all of this. I do think we can proceed with the Oasis trilayer #4 today. 3/18; not as much improvement in these areas on his heels that I was hoping. I did reapply trilateral Oasis today the tissue looks healthier but not as much fill in as I was hoping. 3/25; better looking today I think this is come in a bit the tissue looks healthier. Triple layer Oasis reapplied #6 4/1; somewhat better looking definitely better looking surface not as much change in surface area as I was hoping. He may be spending more time [place] on days then he needs to although he does have  heel offloading boots. Triple layer Oasis reapplied #7 4/7; unfortunately apparently [facility] will not approve any further Oasis which is unfortunate since the patient did respond nicely both in terms of the condition of the wound bed as well as surface area. There is still some drainage coming from the wound but not a lot there does not appear to be any infection 4/15; we have been using Hydrofera Blue. He continues to have nice rims of epithelialization on the right greater than the left. The left the epithelialization is coming from the tip of his heel. There is moderate drainage. In this that concerns me about a total contact cast. There is no evidence of infection 4/29; patient has been using Hydrofera Blue with dressing changes. He has no complaints or issues today. 5/5; using Hydrofera Blue. I actually think that he looks marginally better than the last time I saw this 3 weeks ago. There are rims of epithelialization on the left thumb coming from the medial side on the right. Using Hydrofera Blue 5/12; using Hydrofera Blue. These continue to make improvements in surface area. His drainage was not listed as severe I therefore went ahead and put a cast on the left foot. Right foot we will continue to dress his previous 5/16; back for first total contact cast change. He did not tolerate this particularly well cast injury on the anterior tibia among other issues. Difficulty sleeping. I talked him about this in some detail and afterwards is elected to continue. I told him I would like to have a cast on for 3 weeks to see if this is going to help at all. I think he agreed 5/19; I think the wound is better. There is no tunneling towards his midfoot. The undermining medially also looks better. He has a rim of new skin distally. I think we are making progress here. The area on the left also continues to look somewhat better to me using Hydrofera Blue. He has a list of complaints about  the cast but none of them seem serious Electronic Signature(s) Signed: 12/24/2020 4:52:14 PM By: Linton Ham MD Entered By: Linton Ham on 12/24/2020 12:34:30 -------------------------------------------------------------------------------- Physical Exam Details Patient Name: Date of Service: [name], [place] 12/24/2020 10:30 A M Medical Record Number: [id number] Patient Account Number: [id number] Date of Birth/Sex: Treating RN: 1976-02-26 (45 y.o. M) Primary Care Provider: [name] Other Clinician: Referring Provider: Treating Provider/Extender: [name] in Treatment: 23 Notes Wound exam; left and right plantar foot wounds predominantly on the heel. No debridement  necessary I replaced the cast on the left I think we are making slow improvements with this. There is no evidence of infection in either foot On the right this also looks somewhat better. Slow but steady improvement Electronic Signature(s) Signed: 12/24/2020 4:52:14 PM By: Linton Ham MD Entered By: Linton Ham on 12/24/2020 12:35:19 -------------------------------------------------------------------------------- Physician Orders Details Patient Name: Date of Service: [name], [place] 12/24/2020 10:30 A M Medical Record Number: [id number] Patient Account Number: [id number] Date of Birth/Sex: Treating RN: 1976-05-25 (45 y.o. 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 - Thursday for [facility] appt. ****60 minutes for cast changes**** Return A Bathing/ Shower/ Hygiene May shower with protection but do not get wound dressing(s) wet. Edema Control - Lymphedema / SCD / Other Bilateral Lower Extremities Elevate legs to the level of the heart or above for 30 minutes daily and/or when sitting, a frequency of: Avoid standing for long periods of time. Off-Loading Total Contact Cast to Left Lower Extremity Wedge shoe to: - Glophed Shoe to right foot Other: - keep pressure off of the bottom of your feet Additional Orders / Instructions Follow Nutritious Diet Wound Treatment Wound #1 - Calcaneus Wound Laterality: Right Cleanser: Normal Saline (Generic) Every Other Day/15 Days Discharge Instructions: Cleanse the wound with Normal Saline prior to applying a clean dressing using gauze sponges, not tissue or cotton balls. Cleanser: Wound Cleanser Every Other Day/15 Days Discharge Instructions: Cleanse the wound with wound cleanser prior to applying a clean dressing using gauze sponges, not tissue or cotton balls. Prim Dressing: Hydrofera Blue Classic Foam, 4x4 in (Dispense As Written) Every Other Day/15 Days ary Discharge Instructions: Moisten with saline prior to applying to wound bed Secured With: Elastic Bandage 4 inch (ACE bandage) Every Other Day/15 Days Discharge Instructions: Secure with ACE bandage as directed. Secured With: [employer], 4.5x3.1 (in/yd) (Generic) Every Other Day/15 Days Discharge Instructions: Secure with Kerlix as directed. Wound #2 - Calcaneus Wound Laterality: Left Cleanser: Normal Saline (Generic) Every Other Day/15 Days Discharge Instructions: Cleanse the wound with Normal Saline prior to applying a clean dressing using gauze sponges, not tissue or cotton balls. Cleanser: Wound Cleanser Every Other Day/15 Days Discharge Instructions: Cleanse the wound with wound cleanser prior to applying a clean  dressing using gauze sponges, not tissue or cotton balls. Prim Dressing: Hydrofera Blue Classic Foam, 4x4 in (Dispense As Written) Every Other Day/15 Days ary Discharge Instructions: Moisten with saline prior to applying to wound bed Secondary Dressing: Woven Gauze Sponge, Non-Sterile 4x4 in (Generic) Every Other Day/15 Days Discharge Instructions: Apply over primary dressing as directed. Secondary Dressing: ABD Pad, 8x10 (Generic) Every Other Day/15 Days Discharge Instructions: Apply over primary dressing as directed. Secondary Dressing: Zetuvit Plus 4x8 in (Generic) Every Other Day/15 Days Discharge Instructions: Apply over primary dressing as directed. Secured With: [employer], 4.5x3.1 (in/yd) (Generic) Every Other Day/15 Days Discharge Instructions: Secure with Kerlix as directed. Electronic Signature(s) Signed: 12/24/2020 4:52:14 PM By: Linton Ham MD Signed: 12/24/2020 6:00:49 PM By: [name] Entered By: [name] on 12/24/2020  11:37:08 -------------------------------------------------------------------------------- Problem List Details Patient Name: Date of Service: [name], [place] 12/24/2020 10:30 A M Medical Record Number: [id number] Patient Account Number: [id number] Date of Birth/Sex: Treating RN: 1975/11/03 (45 y.o. [name] Primary Care Provider: [name] Other Clinician: Referring Provider: Treating Provider/Extender: [name] in Treatment: 23 Active Problems ICD-10 Encounter Code Description Active Date MDM Diagnosis [phone] Pressure ulcer of other site, stage 3 07/15/2020 No Yes M62.81 Muscle weakness (generalized) 07/15/2020 No Yes I10 Essential (primary) hypertension 07/15/2020 No Yes L97.512 Non-pressure chronic ulcer of other part of right foot with fat layer exposed 09/03/2020 No Yes L97.522 Non-pressure chronic ulcer of other 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 09/03/2020 No Yes Inactive  Problems ICD-10 Code Description Active Date Inactive Date M86.171 Other acute osteomyelitis, right ankle and foot 09/03/2020 09/03/2020 Resolved Problems Electronic Signature(s) Signed: 12/24/2020 4:52:14 PM By: Linton Ham MD Entered By: Linton Ham on 12/24/2020 12:33:10 -------------------------------------------------------------------------------- Progress Note Details Patient Name: Date of Service: [name], [place] 12/24/2020 10:30 A M Medical Record Number: [id number] Patient Account Number: [id number] Date of Birth/Sex: Treating RN: 03-01-1976 (45 y.o. M) Primary Care Provider: [name] Other Clinician: Referring Provider: Treating Provider/Extender: [name] in Treatment: 23 Subjective History of Present Illness (HPI) 07/15/2020 upon evaluation today patient presents for initial inspection here in our clinic concerning issues he has been having with the bottoms of his feet bilaterally. He states these actually occurred as wounds when he was hospitalized for 5 months secondary to Covid. He was apparently with tilting bed where he was in an upright position quite frequently and apparently this occurred in some way shape or form during that time. Fortunately there is no sign of active infection at this time. No fevers, chills, nausea, vomiting, or diarrhea. With that being said he still has substantial wounds on the plantar aspects of his feet Theragen require quite a bit of work to get these to heal. He has been using Santyl currently though that is been problematic both in receiving the medication as well as actually paid for it as it is become quite expensive. Prior to the experience with Covid the patient really did not have any major medical problems other than hypertension he does have some mild generalized weakness following the Covid experience. 07/22/2020 on evaluation today patient appears to be doing okay in regard to his foot ulcers I  feel like the wound beds are showing signs of better improvement that I do believe the Iodoflex is helping in this regard. With that being said he does have a lot of drainage currently and this is somewhat blue/green in nature which is consistent with Pseudomonas. I do think a culture today would be appropriate for [place] to evaluate and see if that is indeed the case I would likely start him on antibiotic orally as well he is not allergic to Cipro knows of no issues he has had in the past 12/21; patient was admitted to the clinic earlier this month with bilateral presumed pressure ulcers on the bottom of his feet apparently related to excessive pressure from a tilt table arrangement in the intensive care unit. Patient relates this to being on ECMO but I am not really sure that is exactly related to that. I must say I have never seen anything like this. He has fairly extensive full-thickness wounds extending from his heel towards his midfoot mostly centered laterally. There is already been some healing distally.  He does not appear to have an arterial issue. He has been using gentamicin to the wound surfaces with Iodoflex to help with ongoing debridement 1/6; this is a patient with pressure ulcers on the bottom of his feet related to excessive pressure from a standing position in the intensive care unit. He is complaining of a lot of pain in the right heel. He is not a diabetic. 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 We have been using Iodoflex to help prepare the surfaces of both wounds for an advanced treatment product. He is nonambulatory spending most of his time in a wheelchair I have asked him not to propel the wheelchair with his heels 1/13; in general his wounds look better not much surface area change we have been using Iodoflex as of last week. I did an x-ray of the right heel as the patient was complaining of pain. I had some thoughts about a stress fracture perhaps  Achilles tendon problems however what it showed was erosive changes along the inferior aspect of the calcaneus he now has a MRI booked for 1/20. 1/20; in general his wounds continue to be better. Some improvement in the large narrow areas proximally in his foot. He is still complaining of pain in the right heel and tenderness in certain areas of this wound. His MRI is tonight. I am not just looking for osteomyelitis that was brought up on the x-ray I am wondering about stress fractures, tendon ruptures etc. He has no such findings on the left. Also noteworthy is that the patient had critical illness neuropathy and some of the discomfort may be actual improvement in nerve function I am just not sure. These wounds were initially in the setting of severe critical illness related to COVID-19. He was put in a standing position. He may have also been on pressors at the point contributing to tissue ischemia. By his description at some point these wounds were grossly necrotic extending proximally up into the Achilles part of his heel. I do not know that I have ever really seen pictures of them like this although they may exist in epic We have ordered Tri layer Oasis. I am trying to stimulate some granulation in these areas. This is of course assuming the MRI is negative for infection 1/27; since the patient was last here he saw Dr. Juleen China of infectious disease. He is planned for vancomycin and ceftriaxone. Prior operative culture grew MSSA. Also ordered baseline lab work. He also ordered arterial studies although the ABIs in our clinic were normal as well as his clinical exam these were normal I do not think he needs to see vascular surgery. His ABIs at the PTA were 1.22 in the right triphasic waveforms with a normal TBI of 1.15 on the left ABI of 1.22 with triphasic waveforms and a normal TBI of 1.08. Finally he saw Dr. Amalia Hailey who will follow him in for 2 months. At this point I do not think he felt that he  needed a procedure on the right calcaneal bone. Dr. Juleen China is elected for broad-spectrum antibiotic The patient is still having pain in the right heel. He walks with a walker 2/3; wounds are generally smaller. He is tolerating his IV antibiotics. I believe this is vancomycin and ceftriaxone. We are still waiting for Oasis burn in terms of his out-of-pocket max which he should be meeting soon given the IV antibiotics, MRIs etc. I have asked him to check in on this. We are  using silver collagen in the meantime the wounds look better 2/10; tolerating IV vancomycin and Rocephin. We are waiting to apply for Oasis. Although I am not really sure where he is in his out-of-pocket max. 2/17 started the first application of Oasis trilayer. Still on antibiotics. The wounds have generally look better. The area on the left has a little more surface slough requiring debridement 9/60; second application of Oasis trilayer. The wound surface granulation is generally look better. The area on the left with undermining laterally I think is come in a bit. 10/08/2020 upon evaluation today patient is here today for [employer] application #3. Fortunately he seems to be doing extremely well with regard to this and we are seeing a lot of new epithelial growth which is great news. Fortunately there is no signs of active infection at this time. 10/16/2020 upon evaluation today patient appears to be doing well with regard to his foot ulcers. Do believe the Oasis has been of benefit for him. I do not see any signs of infection right now which is great news and I think that he has a lot of new epithelial growth which is great to see as well. The patient is very pleased to hear all of this. I do think we can proceed with the Oasis trilayer #4 today. 3/18; not as much improvement in these areas on his heels that I was hoping. I did reapply trilateral Oasis today the tissue looks healthier but not as much fill in as I was  hoping. 3/25; better looking today I think this is come in a bit the tissue looks healthier. Triple layer Oasis reapplied #6 4/1; somewhat better looking definitely better looking surface not as much change in surface area as I was hoping. He may be spending more time [place] on days then he needs to although he does have heel offloading boots. Triple layer Oasis reapplied #7 4/7; unfortunately apparently [facility] will not approve any further Oasis which is unfortunate since the patient did respond nicely both in terms of the condition of the wound bed as well as surface area. There is still some drainage coming from the wound but not a lot there does not appear to be any infection 4/15; we have been using Hydrofera Blue. He continues to have nice rims of epithelialization on the right greater than the left. The left the epithelialization is coming from the tip of his heel. There is moderate drainage. In this that concerns me about a total contact cast. There is no evidence of infection 4/29; patient has been using Hydrofera Blue with dressing changes. He has no complaints or issues today. 5/5; using Hydrofera Blue. I actually think that he looks marginally better than the last time I saw this 3 weeks ago. There are rims of epithelialization on the left thumb coming from the medial side on the right. Using Hydrofera Blue 5/12; using Hydrofera Blue. These continue to make improvements in surface area. His drainage was not listed as severe I therefore went ahead and put a cast on the left foot. Right foot we will continue to dress his previous 5/16; back for first total contact cast change. He did not tolerate this particularly well cast injury on the anterior tibia among other issues. Difficulty sleeping. I talked him about this in some detail and afterwards is elected to continue. I told him I would like to have a cast on for 3 weeks to see if this is going to help  at all. I think he  agreed 5/19; I think the wound is better. There is no tunneling towards his midfoot. The undermining medially also looks better. He has a rim of new skin distally. I think we are making progress here. The area on the left also continues to look somewhat better to me using Hydrofera Blue. He has a list of complaints about the cast but none of them seem serious Objective Constitutional Vitals Time Taken: 10:42 AM, Height: 69 in, Weight: 280 lbs, BMI: 41.3, Temperature: 98.6 F, Pulse: 97 bpm, Respiratory Rate: 16 breaths/min, Blood Pressure: 154/93 mmHg. Integumentary (Hair, Skin) Wound #1 status is Open. Original cause of wound was Pressure Injury. The date acquired was: 10/07/2019. The wound has been in treatment 23 weeks. The wound is located on the Right Calcaneus. The wound measures 3.5cm length x 1.1cm width x 0.3cm depth; 3.024cm^2 area and 0.907cm^3 volume. There is Fat Layer (Subcutaneous Tissue) exposed. There is no tunneling or undermining noted. There is a medium amount of drainage noted. The wound margin is flat and intact. There is medium (34-66%) pink granulation within the wound bed. There is a medium (34-66%) amount of necrotic tissue within the wound bed including Adherent Slough. Wound #2 status is Open. Original cause of wound was Pressure Injury. The date acquired was: 10/07/2019. The wound has been in treatment 23 weeks. The wound is located on the Left Calcaneus. The wound measures 4.7cm length x 1.7cm width x 0.4cm depth; 6.275cm^2 area and 2.51cm^3 volume. There is Fat Layer (Subcutaneous Tissue) exposed. There is no tunneling or undermining noted. There is a medium amount of serosanguineous drainage noted. The wound margin is flat and intact. There is large (67-100%) pink granulation within the wound bed. There is a small (1-33%) amount of necrotic tissue within the wound bed including Adherent Slough. Assessment Active Problems ICD-10 Pressure ulcer of other site, stage  3 Muscle weakness (generalized) Essential (primary) hypertension Non-pressure chronic ulcer of other part of right foot with fat layer exposed Non-pressure chronic ulcer of other 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 Procedures Wound #2 Pre-procedure diagnosis of Wound #2 is a Pressure Ulcer located on the Left Calcaneus . There was a T Contact Cast Procedure by Deno Etienne MD. Post procedure Diagnosis Wound #2: Same as Pre-Procedure Plan Follow-up Appointments: Return Appointment in 1 week. - Thursday for [facility] appt. ****60 minutes for cast changes**** Bathing/ Shower/ Hygiene: May shower with protection but do not get wound dressing(s) wet. Edema Control - Lymphedema / SCD / Other: Elevate legs to the level of the heart or above for 30 minutes daily and/or when sitting, a frequency of: Avoid standing for long periods of time. Off-Loading: T Contact Cast to Left Lower Extremity otal Wedge shoe to: - Glophed Shoe to right foot Other: - keep pressure off of the bottom of your feet Additional Orders / Instructions: Follow Nutritious Diet WOUND #1: - Calcaneus Wound Laterality: Right Cleanser: Normal Saline (Generic) Every Other Day/15 Days Discharge Instructions: Cleanse the wound with Normal Saline prior to applying a clean dressing using gauze sponges, not tissue or cotton balls. Cleanser: Wound Cleanser Every Other Day/15 Days Discharge Instructions: Cleanse the wound with wound cleanser prior to applying a clean dressing using gauze sponges, not tissue or cotton balls. Prim Dressing: Hydrofera Blue Classic Foam, 4x4 in (Dispense As Written) Every Other Day/15 Days ary Discharge Instructions: Moisten with saline prior to applying to wound bed Secured With: Elastic Bandage 4 inch (ACE  bandage) Every Other Day/15 Days Discharge Instructions: Secure with ACE bandage as directed. Secured With: [employer], 4.5x3.1 (in/yd) (Generic) Every Other Day/15  Days Discharge Instructions: Secure with Kerlix as directed. WOUND #2: - Calcaneus Wound Laterality: Left Cleanser: Normal Saline (Generic) Every Other Day/15 Days Discharge Instructions: Cleanse the wound with Normal Saline prior to applying a clean dressing using gauze sponges, not tissue or cotton balls. Cleanser: Wound Cleanser Every Other Day/15 Days Discharge Instructions: Cleanse the wound with wound cleanser prior to applying a clean dressing using gauze sponges, not tissue or cotton balls. Prim Dressing: Hydrofera Blue Classic Foam, 4x4 in (Dispense As Written) Every Other Day/15 Days ary Discharge Instructions: Moisten with saline prior to applying to wound bed Secondary Dressing: Woven Gauze Sponge, Non-Sterile 4x4 in (Generic) Every Other Day/15 Days Discharge Instructions: Apply over primary dressing as directed. Secondary Dressing: ABD Pad, 8x10 (Generic) Every Other Day/15 Days Discharge Instructions: Apply over primary dressing as directed. Secondary Dressing: Zetuvit Plus 4x8 in (Generic) Every Other Day/15 Days Discharge Instructions: Apply over primary dressing as directed. Secured With: [employer], 4.5x3.1 (in/yd) (Generic) Every Other Day/15 Days Discharge Instructions: Secure with Kerlix as directed. 1. I think we are making slow and steady improvement on the left under the total contact cast and I told the patient I wanted to continue this. 2. The problem will be that if and when this heals it is likely to have no fat pad on the heel. Aggressive offloading unfortunately is going to be necessary. 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 I do not know that he will be able to do this type of job and keep this wound intact. No doubt and any ongoing basis he is going to need offloading as best we can muster in these areas. 3. I think the right heel is also improving. Electronic Signature(s) Signed:  12/24/2020 4:52:14 PM By: Linton Ham MD Entered By: Linton Ham on 12/24/2020 12:36:44 -------------------------------------------------------------------------------- Total Contact Cast Details Patient Name: Date of Service: [name], [place] 12/24/2020 10:30 A M Medical Record Number: [id number] Patient Account Number: [id number] Date of Birth/Sex: Treating RN: [birth year] (45 y.o. [name] Primary Care Provider: [name] Other Clinician: Referring Provider: Treating Provider/Extender: [name] in Treatment: 23 T Contact Cast Applied for Wound Assessment: otal Wound #2 Left Calcaneus Performed By: Physician Ricard Dillon., MD Post Procedure Diagnosis Same as Pre-procedure Electronic Signature(s) Signed: 12/24/2020 4:52:14 PM By: Linton Ham MD Signed: 12/24/2020 6:00:49 PM By: [name] Entered By: [name] on 12/24/2020 11:35:22 -------------------------------------------------------------------------------- SuperBill Details Patient Name: Date of Service: [name], [place] 12/24/2020 Medical Record Number: [id number] Patient Account Number: [id number] Date of Birth/Sex: Treating RN: 11/09/1975 (45 y.o. [name], [name] Primary Care Provider: [name] Other Clinician: Referring Provider: Treating Provider/Extender: [name] in Treatment: 23 Diagnosis Coding ICD-10 Codes Code Description [phone] Pressure ulcer of other site, stage 3 M62.81 Muscle weakness (generalized) I10 Essential (primary) hypertension L97.512 Non-pressure chronic ulcer of other part of right foot with fat layer exposed L97.522 Non-pressure chronic ulcer of other 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 Facility Procedures CPT4 Code: [id number] Description: [phone] - APPLY TOTAL CONTACT LEG CAST ICD-10 Diagnosis Description L97.522 Non-pressure chronic ulcer of other part of left foot with fat layer  exposed Modifier: Quantity: 1 Physician Procedures : CPT4 Code Description Modifier [id number] - WC  PHYS APPLY TOTAL CONTACT CAST ICD-10 Diagnosis Description L97.522 Non-pressure chronic ulcer of other 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 Quantity: 1 Electronic Signature(s) Signed: 12/24/2020 4:52:14 PM By: Linton Ham MD Entered By: Linton Ham on 12/24/2020 12:36:54

## 2020-12-28 NOTE — Progress Notes (Signed)
[name], [name] ([id number]) Visit Report for 12/24/2020 Arrival Information Details Patient Name: Date of Service: [name], [name] 12/24/2020 10:30 A M Medical Record Number: [id number] Patient Account Number: [id number] Date of Birth/Sex: Treating RN: 1975/10/13 (45 y.o. M) Primary Care [name]: [name] Other Clinician: Referring [name]: Treating [name]/Extender: [name] in Treatment: 47 Visit Information History Since Last Visit Added or deleted any medications: No Patient Arrived: Wheel Chair Any new allergies or adverse reactions: No Arrival Time: 10:41 Had a fall or experienced change in No Accompanied By: sister activities of daily living that may affect Transfer Assistance: None risk of falls: Patient Identification Verified: Yes Signs or symptoms of abuse/neglect since last visito No Secondary Verification Process Completed: Yes Hospitalized since last visit: No Patient Requires Transmission-Based Precautions: No Implantable device outside of the clinic excluding No Patient Has Alerts: No cellular tissue based products placed in the center since last visit: Has Dressing in Place as Prescribed: Yes Pain Present Now: Yes Electronic Signature(s) Signed: 12/24/2020 4:57:25 PM By: [name] Entered By: [name] on 12/24/2020 10:42:17 -------------------------------------------------------------------------------- Lower Extremity Assessment Details Patient Name: Date of Service: [name], [name] E. 12/24/2020 10:30 A M Medical Record Number: [id number] Patient Account Number: [id number] Date of Birth/Sex: Treating RN: [birth year] (45 y.o. [name] Primary Care Shekina Cordell: [name] Other Clinician: Referring [name]: Treating [name]/Extender: [name] in Treatment: 23 Edema Assessment Assessed: [name]: No] [name]: No] Edema: [Left: Yes] [Right: Yes] Calf Left: Right: Point of  Measurement: 29 cm From Medial Instep 43 cm 43 cm Ankle Left: Right: Point of Measurement: 9 cm From Medial Instep 25 cm 24.5 cm Vascular Assessment Pulses: Dorsalis Pedis Palpable: [Left:Yes] [Right:Yes] Electronic Signature(s) Signed: 12/28/2020 5:27:53 PM By: [name] Hurst RN, BSN Entered By: [name] on 12/24/2020 11:10:25 -------------------------------------------------------------------------------- Multi Wound Chart Details Patient Name: Date of Service: [name], [name] 12/24/2020 10:30 A M Medical Record Number: [id number] Patient Account Number: [id number] Date of Birth/Sex: Treating RN: 1975/12/16 (45 y.o. M) Primary Care [name]: [name] Other Clinician: Referring [name]: Treating [name]/Extender: [name] in Treatment: 23 Vital Signs Height(in): 69 Pulse(bpm): 97 Weight(lbs): 280 Blood Pressure(mmHg): 154/93 Body Mass Index(BMI): 41 Temperature(F): 98.6 Respiratory Rate(breaths/min): 16 Photos: [1:No Photos Right Calcaneus] [2:No Photos Left Calcaneus] [N/A:N/A N/A] Wound Location: [1:Pressure Injury] [2:Pressure Injury] [N/A:N/A] Wounding Event: [1:Pressure Ulcer] [2:Pressure Ulcer] [N/A:N/A] Primary Etiology: [1:Asthma, Angina, Hypertension] [2:Asthma, Angina, Hypertension] [N/A:N/A] Comorbid History: [1:10/07/2019] [2:10/07/2019] [N/A:N/A] Date Acquired: [1:23] [2:23] [N/A:N/A] Weeks of Treatment: [1:Open] [2:Open] [N/A:N/A] Wound Status: [1:3.5x1.1x0.3] [2:4.7x1.7x0.4] [N/A:N/A] Measurements L x W x D (cm) [1:3.024] [2:6.275] [N/A:N/A] A (cm) : rea [1:0.907] [2:2.51] [N/A:N/A] Volume (cm) : [1:90.40%] [2:76.80%] [N/A:N/A] % Reduction in A rea: [1:97.10%] [2:90.70%] [N/A:N/A] % Reduction in Volume: [1:Category/Stage III] [2:Category/Stage III] [N/A:N/A] Classification: [1:Medium] [2:Medium] [N/A:N/A] Exudate A mount: [1:N/A] [2:Serosanguineous] [N/A:N/A] Exudate Type: [1:N/A] [2:red, brown] [N/A:N/A] Exudate  Color: [1:Flat and Intact] [2:Flat and Intact] [N/A:N/A] Wound Margin: [1:Medium (34-66%)] [2:Large (67-100%)] [N/A:N/A] Granulation A mount: [1:Pink] [2:Pink] [N/A:N/A] Granulation Quality: [1:Medium (34-66%)] [2:Small (1-33%)] [N/A:N/A] Necrotic A mount: [1:Fat Layer (Subcutaneous Tissue): Yes Fat Layer (Subcutaneous Tissue): Yes N/A] Exposed Structures: [1:Fascia: No Tendon: No Muscle: No Joint: No Bone: No Small (1-33%)] [2:Fascia: No Tendon: No Muscle: No Joint: No Bone: No Small (1-33%)] [N/A:N/A] Epithelialization: [1:N/A] [2:T Contact Cast otal] [N/A:N/A] Treatment Notes Electronic Signature(s) Signed: 12/24/2020 4:52:14 PM By: Linton Ham MD Entered By: Linton Ham on 12/24/2020 12:33:16 -------------------------------------------------------------------------------- Multi-Disciplinary  Care Plan Details Patient Name: Date of Service: [name], [name] 12/24/2020 10:30 A M Medical Record Number: [id number] Patient Account Number: [id number] Date of Birth/Sex: Treating RN: [birth year] (45 y.o. [name] Primary Care [name]: [name] Other Clinician: Referring [name]: Treating [name]/Extender: [name] in Treatment: 23 Multidisciplinary Care Plan reviewed with physician Active Inactive Abuse / Safety / Falls / Self Care Management Nursing Diagnoses: Potential for falls Goals: Patient/caregiver will verbalize/demonstrate measures taken to prevent injury and/or falls Date Initiated: 07/15/2020 Target Resolution Date: 02/05/2021 Goal Status: Active Interventions: Assess fall risk on admission and as needed Assess impairment of mobility on admission and as needed per policy Notes: Electronic Signature(s) Signed: 12/24/2020 6:00:49 PM By: [name] Entered By: [name] on 12/24/2020 11:35:56 -------------------------------------------------------------------------------- Pain Assessment Details Patient Name: Date of  Service: [name], [name] [place] E. 12/24/2020 10:30 A M Medical Record Number: [id number] Patient Account Number: [id number] Date of Birth/Sex: Treating RN: June 30, 1976 (45 y.o. M) Primary Care Megean Fabio: [name] Other Clinician: Referring Veryl Winemiller: Treating Rorey Hodges/Extender: [name] in Treatment: 23 Active Problems Location of Pain Severity and Description of Pain Patient Has Paino Yes Site Locations Rate the pain. Current Pain Level: 4 Pain Management and Medication Current Pain Management: Electronic Signature(s) Signed: 12/24/2020 4:57:25 PM By: [name] Entered By: [name] on 12/24/2020 10:42:46 -------------------------------------------------------------------------------- Patient/Caregiver Education Details Patient Name: Date of Service: [name], [name] 5/19/2022andnbsp10:30 [place] Record Number: [id number] Patient Account Number: [id number] Date of Birth/Gender: Treating RN: 03/22/1976 (45 y.o. [name] Primary Care Physician: [name] Other Clinician: Referring Physician: Treating Physician/Extender: [name] in Treatment: 23 Education Assessment Education Provided To: Patient Education Topics Provided Wound/Skin Impairment: Handouts: Skin Care Do's and Dont's Methods: Explain/Verbal Responses: Reinforcements needed Electronic Signature(s) Signed: 12/24/2020 6:00:49 PM By: [name] Entered By: [name] on 12/24/2020 11:36:08 -------------------------------------------------------------------------------- Wound Assessment Details Patient Name: Date of Service: [name], [name] 12/24/2020 10:30 A M Medical Record Number: [id number] Patient Account Number: [id number] Date of Birth/Sex: Treating RN: Nov 21, 1975 (45 y.o. M) Primary Care Mollie Rossano: [name] Other Clinician: Referring Jian Hodgman: Treating Zareah Hunzeker/Extender: [name] in  Treatment: 23 Wound Status Wound Number: 1 Primary Etiology: Pressure Ulcer Wound Location: Right Calcaneus Wound Status: Open Wounding Event: Pressure Injury Comorbid History: Asthma, Angina, Hypertension Date Acquired: 10/07/2019 Weeks Of Treatment: 23 Clustered Wound: No Photos Wound Measurements Length: (cm) 3.5 Width: (cm) 1.1 Depth: (cm) 0.3 Area: (cm) 3.024 Volume: (cm) 0.907 % Reduction in Area: 90.4% % Reduction in Volume: 97.1% Epithelialization: Small (1-33%) Tunneling: No Undermining: No Wound Description Classification: Category/Stage III Wound Margin: Flat and Intact Exudate Amount: Medium Foul Odor After Cleansing: No Slough/Fibrino Yes Wound Bed Granulation Amount: Medium (34-66%) Exposed Structure Granulation Quality: Pink Fascia Exposed: No Necrotic Amount: Medium (34-66%) Fat Layer (Subcutaneous Tissue) Exposed: Yes Necrotic Quality: Adherent Slough Tendon Exposed: No Muscle Exposed: No Joint Exposed: No Bone Exposed: No Electronic Signature(s) Signed: 12/24/2020 4:57:25 PM By: [name] Entered By: [name] on 12/24/2020 16:24:28 -------------------------------------------------------------------------------- Wound Assessment Details Patient Name: Date of Service: [name], [name] 12/24/2020 10:30 A M Medical Record Number: [id number] Patient Account Number: [id number] Date of Birth/Sex: Treating RN: 1976/08/01 (45 y.o. M) Primary Care Kaylee Wombles: [name] Other Clinician: Referring Mccartney Brucks: Treating Chase Arnall/Extender: [name] in Treatment: 23 Wound Status Wound Number: 2 Primary Etiology: Pressure Ulcer Wound Location: Left Calcaneus Wound Status: Open  Wounding Event: Pressure Injury Comorbid History: Asthma, Angina, Hypertension Date Acquired: 10/07/2019 Weeks Of Treatment: 23 Clustered Wound: No Photos Wound Measurements Length: (cm) 4.7 Width: (cm) 1.7 Depth: (cm) 0.4 Area: (cm)  6.275 Volume: (cm) 2.51 % Reduction in Area: 76.8% % Reduction in Volume: 90.7% Epithelialization: Small (1-33%) Tunneling: No Undermining: No Wound Description Classification: Category/Stage III Wound Margin: Flat and Intact Exudate Amount: Medium Exudate Type: Serosanguineous Exudate Color: red, brown Foul Odor After Cleansing: No Slough/Fibrino Yes Wound Bed Granulation Amount: Large (67-100%) Exposed Structure Granulation Quality: Pink Fascia Exposed: No Necrotic Amount: Small (1-33%) Fat Layer (Subcutaneous Tissue) Exposed: Yes Necrotic Quality: Adherent Slough Tendon Exposed: No Muscle Exposed: No Joint Exposed: No Bone Exposed: No Electronic Signature(s) Signed: 12/24/2020 4:57:25 PM By: [name] Entered By: [name] on 12/24/2020 16:24:46 -------------------------------------------------------------------------------- Vitals Details Patient Name: Date of Service: [name], [name] E. 12/24/2020 10:30 A M Medical Record Number: [id number] Patient Account Number: [id number] Date of Birth/Sex: Treating RN: 03-07-1976 (45 y.o. M) Primary Care Dyani Babel: [name] Other Clinician: Referring Candita Borenstein: Treating Maxon Kresse/Extender: [name] in Treatment: 23 Vital Signs Time Taken: 10:42 Temperature (F): 98.6 Height (in): 69 Pulse (bpm): 97 Weight (lbs): 280 Respiratory Rate (breaths/min): 16 Body Mass Index (BMI): 41.3 Blood Pressure (mmHg): 154/93 Reference Range: 80 - 120 mg / dl Electronic Signature(s) Signed: 12/24/2020 4:57:25 PM By: [name] Entered By: [name] on 12/24/2020 10:42:33

## 2020-12-31 ENCOUNTER — Encounter (HOSPITAL_BASED_OUTPATIENT_CLINIC_OR_DEPARTMENT_OTHER): Payer: BC Managed Care – PPO | Admitting: Internal Medicine

## 2020-12-31 ENCOUNTER — Other Ambulatory Visit: Payer: Self-pay

## 2020-12-31 DIAGNOSIS — L89893 Pressure ulcer of other site, stage 3: Secondary | ICD-10-CM | POA: Diagnosis not present

## 2020-12-31 DIAGNOSIS — L97512 Non-pressure chronic ulcer of other part of right foot with fat layer exposed: Secondary | ICD-10-CM

## 2020-12-31 DIAGNOSIS — L97522 Non-pressure chronic ulcer of other 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: Secondary | ICD-10-CM | POA: Diagnosis not present

## 2020-12-31 DIAGNOSIS — Z8616 Personal history of COVID-19: Secondary | ICD-10-CM | POA: Diagnosis not present

## 2020-12-31 NOTE — Progress Notes (Signed)
NUSSEN, PULLIN ([id number]) Visit Report for 12/31/2020 Arrival Information Details Patient Name: Date of Service: [name], [place] 12/31/2020 9:15 A M Medical Record Number: [id number] Patient Account Number: [id number] Date of Birth/Sex: Treating RN: 04/07/1976 (45 y.o. [name] Primary Care Akashdeep Chuba: [name] Other Clinician: Referring Henryetta Corriveau: Treating Aberdeen Hafen/Extender: [name] in Treatment: 24 Visit Information History Since Last Visit Added or deleted any medications: No Patient Arrived: Wheel Chair Any new allergies or adverse reactions: No Arrival Time: 09:46 Had a fall or experienced change in No Accompanied By: wife activities of daily living that may affect Transfer Assistance: None risk of falls: Patient Identification Verified: Yes Signs or symptoms of abuse/neglect since last visito No Secondary Verification Process Completed: Yes Hospitalized since last visit: No Patient Requires Transmission-Based Precautions: No Implantable device outside of the clinic excluding No Patient Has Alerts: No cellular tissue based products placed in the center since last visit: Has Dressing in Place as Prescribed: Yes Pain Present Now: Yes Electronic Signature(s) Signed: 12/31/2020 4:57:49 PM By: Sandre Kitty Entered By: Sandre Kitty on 12/31/2020 09:47:35 -------------------------------------------------------------------------------- Encounter Discharge Information Details Patient Name: Date of Service: [name], [place] 12/31/2020 9:15 A M Medical Record Number: [id number] Patient Account Number: [id number] Date of Birth/Sex: Treating RN: January 06, 1976 (45 y.o. [name] Primary Care Xue Low: [name] Other Clinician: Referring Rayshawn Visconti: Treating Lessie Funderburke/Extender: [name] in Treatment: 24 Encounter Discharge Information Items Post Procedure Vitals Discharge Condition: Stable Temperature  (F): 98.7 Ambulatory Status: Wheelchair Pulse (bpm): 83 Discharge Destination: Home Respiratory Rate (breaths/min): 18 Transportation: Private Auto Blood Pressure (mmHg): 161/98 Accompanied By: spouse Schedule Follow-up Appointment: Yes Clinical Summary of Care: Patient Declined Electronic Signature(s) Signed: 12/31/2020 5:45:23 PM By: Baruch Gouty RN, BSN Entered By: Baruch Gouty on 12/31/2020 12:51:14 -------------------------------------------------------------------------------- Lower Extremity Assessment Details Patient Name: Date of Service: [name], [place] 12/31/2020 9:15 A M Medical Record Number: [id number] Patient Account Number: [id number] Date of Birth/Sex: Treating RN: Oct 11, 1975 (45 y.o. [name] Primary Care Velmer Broadfoot: [name] Other Clinician: Referring Elishia Kaczorowski: Treating Rawson Minix/Extender: [name] in Treatment: 24 Edema Assessment Assessed: [name]: Yes] [name]: Yes] Edema: [Left: Yes] [Right: Yes] Calf Left: Right: Point of Measurement: 29 cm From Medial Instep 40 cm 44 cm Ankle Left: Right: Point of Measurement: 9 cm From Medial Instep 25 cm 25 cm Vascular Assessment Pulses: Dorsalis Pedis Palpable: [Left:Yes] [Right:Yes] Electronic Signature(s) Signed: 12/31/2020 5:35:44 PM By: Lorrin Jackson Entered By: Lorrin Jackson on 12/31/2020 10:24:04 -------------------------------------------------------------------------------- Multi Wound Chart Details Patient Name: Date of Service: [name], TO [place] 12/31/2020 9:15 A M Medical Record Number: [id number] Patient Account Number: [id number] Date of Birth/Sex: Treating RN: December 04, 1975 (45 y.o. [name], [name] Primary Care Teegan [name]: [name] Other Clinician: Referring Tennyson Kallen: Treating Zailynn Brandel/Extender: [name] in Treatment: 24 Vital Signs Height(in): 69 Pulse(bpm): 83 Weight(lbs): 280 Blood Pressure(mmHg): 161/98 Body Mass  Index(BMI): 41 Temperature(F): 98.7 Respiratory Rate(breaths/min): 16 Photos: [1:No Photos Right Calcaneus] [2:No Photos Left Calcaneus] [N/A:N/A N/A] Wound Location: [1:Pressure Injury] [2:Pressure Injury] [N/A:N/A] Wounding Event: [1:Pressure Ulcer] [2:Pressure Ulcer] [N/A:N/A] Primary Etiology: [1:Asthma, Angina, Hypertension] [2:Asthma, Angina, Hypertension] [N/A:N/A] Comorbid History: [1:10/07/2019] [2:10/07/2019] [N/A:N/A] Date Acquired: [1:24] [2:24] [N/A:N/A] Weeks of Treatment: [1:Open] [2:Open] [N/A:N/A] Wound Status: [1:3.1x1x0.4] [2:4.5x1.5x0.2] [N/A:N/A] Measurements L x W x D (cm) [1:2.435] [2:5.301] [N/A:N/A] A (cm) : rea [1:0.974] [2:1.06] [N/A:N/A] Volume (cm) : [1:92.30%] [2:80.40%] [N/A:N/A] % Reduction in A rea: [1:96.90%] [2:96.10%] [N/A:N/A] %  Reduction in Volume: [1:Category/Stage III] [2:Category/Stage III] [N/A:N/A] Classification: [1:Medium] [2:Medium] [N/A:N/A] Exudate A mount: [1:N/A] [2:Serosanguineous] [N/A:N/A] Exudate Type: [1:N/A] [2:red, brown] [N/A:N/A] Exudate Color: [1:Distinct, outline attached] [2:Distinct, outline attached] [N/A:N/A] Wound Margin: [1:Large (67-100%)] [2:Large (67-100%)] [N/A:N/A] Granulation A mount: [1:Red, Pink] [2:Pink] [N/A:N/A] Granulation Quality: [1:Small (1-33%)] [2:Small (1-33%)] [N/A:N/A] Necrotic A mount: [1:Fat Layer (Subcutaneous Tissue): Yes Fat Layer (Subcutaneous Tissue): Yes N/A] Exposed Structures: [1:Fascia: No Tendon: No Muscle: No Joint: No Bone: No Small (1-33%)] [2:Fascia: No Tendon: No Muscle: No Joint: No Bone: No Small (1-33%)] [N/A:N/A] Epithelialization: [1:Debridement - Excisional] [2:Debridement - Excisional] [N/A:N/A] Debridement: Pre-procedure Verification/Time Out 11:08 [2:11:08] [N/A:N/A] Taken: [1:Lidocaine 4% Topical Solution] [2:Lidocaine 4% Topical Solution] [N/A:N/A] Pain Control: [1:Callus, Subcutaneous, Slough] [2:Callus, Subcutaneous, Slough] [N/A:N/A] Tissue Debrided:  [1:Skin/Subcutaneous Tissue] [2:Skin/Subcutaneous Tissue] [N/A:N/A] Level: [1:3.1] [2:6.75] [N/A:N/A] Debridement A (sq cm): [1:rea Curette] [2:Curette] [N/A:N/A] Instrument: [1:Minimum] [2:Minimum] [N/A:N/A] Bleeding: [1:Pressure] [2:Pressure] [N/A:N/A] Hemostasis A chieved: [1:0] [2:0] [N/A:N/A] Procedural Pain: [1:0] [2:0] [N/A:N/A] Post Procedural Pain: [1:Procedure was tolerated well] [2:Procedure was tolerated well] [N/A:N/A] Debridement Treatment Response: [1:3.1x1x0.4] [2:4.5x1.5x0.2] [N/A:N/A] Post Debridement Measurements L x W x D (cm) [1:0.974] [2:1.06] [N/A:N/A] Post Debridement Volume: (cm) [1:Category/Stage III] [2:Category/Stage III] [N/A:N/A] Post Debridement Stage: [1:Debridement] [2:Debridement] [N/A:N/A] Procedures Performed: [2:T Contact Cast otal] Treatment Notes Electronic Signature(s) Signed: 12/31/2020 11:40:38 AM By: Kalman Shan DO Signed: 12/31/2020 6:02:39 PM By: [name] Entered By: Kalman Shan on 12/31/2020 11:36:31 -------------------------------------------------------------------------------- Multi-Disciplinary Care Plan Details Patient Name: Date of Service: [name], [employer] 12/31/2020 9:15 A M Medical Record Number: [id number] Patient Account Number: [id number] Date of Birth/Sex: Treating RN: 18-Apr-1976 (45 y.o. [name] Primary Care Maelani Yarbro: [name] Other Clinician: Referring Severo Beber: Treating Jerusalen Mateja/Extender: [name] in Treatment: [place] reviewed with physician Active Inactive Abuse / Safety / Falls / Self Care Management Nursing Diagnoses: Potential for falls Goals: Patient/caregiver will verbalize/demonstrate measures taken to prevent injury and/or falls Date Initiated: 07/15/2020 Target Resolution Date: 02/05/2021 Goal Status: Active Interventions: Assess fall risk on admission and as needed Assess impairment of mobility on admission and as needed per  policy Notes: Electronic Signature(s) Signed: 12/31/2020 6:02:39 PM By: [name] Entered By: [name] on 12/31/2020 10:32:53 -------------------------------------------------------------------------------- Pain Assessment Details Patient Name: Date of Service: [name], [place] 12/31/2020 9:15 A M Medical Record Number: [id number] Patient Account Number: [id number] Date of Birth/Sex: Treating RN: 18-May-1976 (45 y.o. [name] Primary Care Kiyla Ringler: [name] Other Clinician: Referring Savhanna Sliva: Treating Kamaiyah Uselton/Extender: [name] in Treatment: 24 Active Problems Location of Pain Severity and Description of Pain Patient Has Paino Yes Site Locations Rate the pain. Current Pain Level: 5 Pain Management and Medication Current Pain Management: Electronic Signature(s) Signed: 12/31/2020 4:57:49 PM By: Sandre Kitty Signed: 12/31/2020 6:02:39 PM By: [name] Entered By: Sandre Kitty on 12/31/2020 09:48:01 -------------------------------------------------------------------------------- Patient/Caregiver Education Details Patient Name: Date of Service: GO URLEY, [name] 5/26/2022andnbsp9:15 A M Medical Record Number: [id number] Patient Account Number: [id number] Date of Birth/Gender: Treating RN: [birth year] (45 y.o. [name] Primary Care Physician: [name] Other Clinician: Referring Physician: Treating Physician/Extender: [name] in Treatment: 24 Education Assessment Education Provided To: Patient Education Topics Provided Wound/Skin Impairment: Handouts: Skin Care Do's and Dont's Methods: Explain/Verbal Responses: Reinforcements needed Electronic Signature(s) Signed: 12/31/2020 6:02:39 PM By: [name] Entered By: [name] on 12/31/2020 10:33:06 -------------------------------------------------------------------------------- Wound Assessment Details Patient  Name: Date of Service: GO URLEY, [place]  12/31/2020 9:15 A M Medical Record Number: [id number] Patient Account Number: [id number] Date of Birth/Sex: Treating RN: [birth year] (45 y.o. [name], [name] Primary Care Aerionna Moravek: [name] Other Clinician: Referring Ginnette Gates: Treating Sherhonda Gaspar/Extender: [name] in Treatment: 24 Wound Status Wound Number: 1 Primary Etiology: Pressure Ulcer Wound Location: Right Calcaneus Wound Status: Open Wounding Event: Pressure Injury Comorbid History: Asthma, Angina, Hypertension Date Acquired: 10/07/2019 Weeks Of Treatment: 24 Clustered Wound: No Photos Wound Measurements Length: (cm) 3.1 Width: (cm) 1 Depth: (cm) 0.4 Area: (cm) 2.435 Volume: (cm) 0.974 % Reduction in Area: 92.3% % Reduction in Volume: 96.9% Epithelialization: Small (1-33%) Tunneling: No Undermining: No Wound Description Classification: Category/Stage III Wound Margin: Distinct, outline attached Exudate Amount: Medium Foul Odor After Cleansing: No Slough/Fibrino Yes Wound Bed Granulation Amount: Large (67-100%) Exposed Structure Granulation Quality: Red, Pink Fascia Exposed: No Necrotic Amount: Small (1-33%) Fat Layer (Subcutaneous Tissue) Exposed: Yes Necrotic Quality: Adherent Slough Tendon Exposed: No Muscle Exposed: No Joint Exposed: No Bone Exposed: No Treatment Notes Wound #1 (Calcaneus) Wound Laterality: Right Cleanser Wound Cleanser Discharge Instruction: Cleanse the wound with wound cleanser prior to applying a clean dressing using gauze sponges, not tissue or cotton balls. Normal Saline Discharge Instruction: Cleanse the wound with Normal Saline prior to applying a clean dressing using gauze sponges, not tissue or cotton balls. Peri-Wound Care Topical Primary Dressing Hydrofera Blue Classic Foam, 4x4 in Discharge Instruction: Moisten with saline prior to applying to wound bed Secondary Dressing ABD Pad, 5x9 Discharge  Instruction: Apply over primary dressing as directed. Secured With Elastic Bandage 4 inch (ACE bandage) Discharge Instruction: Secure with ACE bandage as directed. Kerlix Roll Sterile, 4.5x3.1 (in/yd) Discharge Instruction: Secure with Kerlix as directed. Compression Wrap Compression Stockings Add-Ons Electronic Signature(s) Signed: 12/31/2020 4:57:49 PM By: Sandre Kitty Signed: 12/31/2020 6:02:39 PM By: [name] Entered By: Sandre Kitty on 12/31/2020 16:32:57 -------------------------------------------------------------------------------- Wound Assessment Details Patient Name: Date of Service: [name], [place] 12/31/2020 9:15 A M Medical Record Number: [id number] Patient Account Number: [id number] Date of Birth/Sex: Treating RN: [birth year] (45 y.o. [name], [name] Primary Care Trestan Vahle: [name] Other Clinician: Referring Trayson Stitely: Treating Ali Mclaurin/Extender: [name] in Treatment: 24 Wound Status Wound Number: 2 Primary Etiology: Pressure Ulcer Wound Location: Left Calcaneus Wound Status: Open Wounding Event: Pressure Injury Comorbid History: Asthma, Angina, Hypertension Date Acquired: 10/07/2019 Weeks Of Treatment: 24 Clustered Wound: No Photos Wound Measurements Length: (cm) 4.5 Width: (cm) 1.5 Depth: (cm) 0.2 Area: (cm) 5.301 Volume: (cm) 1.06 % Reduction in Area: 80.4% % Reduction in Volume: 96.1% Epithelialization: Small (1-33%) Tunneling: No Undermining: No Wound Description Classification: Category/Stage III Wound Margin: Distinct, outline attached Exudate Amount: Medium Exudate Type: Serosanguineous Exudate Color: red, brown Foul Odor After Cleansing: No Slough/Fibrino Yes Wound Bed Granulation Amount: Large (67-100%) Exposed Structure Granulation Quality: Pink Fascia Exposed: No Necrotic Amount: Small (1-33%) Fat Layer (Subcutaneous Tissue) Exposed: Yes Necrotic Quality: Adherent Slough Tendon Exposed:  No Muscle Exposed: No Joint Exposed: No Bone Exposed: No Treatment Notes Wound #2 (Calcaneus) Wound Laterality: Left Cleanser Wound Cleanser Discharge Instruction: Cleanse the wound with wound cleanser prior to applying a clean dressing using gauze sponges, not tissue or cotton balls. Normal Saline Discharge Instruction: Cleanse the wound with Normal Saline prior to applying a clean dressing using gauze sponges, not tissue or cotton balls. Peri-Wound Care Zinc Oxide Ointment 30g tube Discharge Instruction: Apply Zinc Oxide to periwound with each dressing change Topical Primary Dressing Hydrofera Blue Classic Foam, 4x4 in  Discharge Instruction: Moisten with saline prior to applying to wound bed Secondary Dressing Woven Gauze Sponge, Non-Sterile 4x4 in Discharge Instruction: Apply over primary dressing as directed. Zetuvit Plus 4x8 in Discharge Instruction: Apply over primary dressing as directed. ABD Pad, 8x10 Discharge Instruction: Apply over primary dressing as directed. Secured With [employer], 4.5x3.1 (in/yd) Discharge Instruction: Secure with Kerlix as directed. Compression Wrap Compression Stockings Add-Ons Electronic Signature(s) Signed: 12/31/2020 4:57:49 PM By: Sandre Kitty Signed: 12/31/2020 6:02:39 PM By: [name] Entered By: Sandre Kitty on 12/31/2020 16:32:37 -------------------------------------------------------------------------------- Vitals Details Patient Name: Date of Service: [name], [place] 12/31/2020 9:15 A M Medical Record Number: [id number] Patient Account Number: [id number] Date of Birth/Sex: Treating RN: 1976/01/26 (45 y.o. [name] Primary Care Asucena Galer: [name] Other Clinician: Referring Paulla Mcclaskey: Treating Ranger Petrich/Extender: [name] in Treatment: 24 Vital Signs Time Taken: 09:47 Temperature (F): 98.7 Height (in): 69 Pulse (bpm): 83 Weight (lbs): 280 Respiratory Rate  (breaths/min): 16 Body Mass Index (BMI): 41.3 Blood Pressure (mmHg): 161/98 Reference Range: 80 - 120 mg / dl Electronic Signature(s) Signed: 12/31/2020 4:57:49 PM By: Sandre Kitty Entered By: Sandre Kitty on 12/31/2020 09:47:52

## 2020-12-31 NOTE — Progress Notes (Signed)
[name], [name] ([id number]) Visit Report for 12/31/2020 Chief Complaint Document Details Patient Name: Date of Service: [name] [name] 12/31/2020 9:15 A M Medical Record Number: [id number] Patient Account Number: [id number] Date of Birth/Sex: Treating RN: October 01, 1975 (45 y.o. [name] Primary Care Provider: Cristie [name] Other Clinician: Referring Provider: Treating Provider/Extender: [name] in Treatment: 24 Information Obtained from: Patient Chief Complaint Bilateral Plantar Foot Ulcers Electronic Signature(s) Signed: 12/31/2020 11:40:38 AM By: [name] Shan DO Entered By: [name] on 12/31/2020 11:36:37 -------------------------------------------------------------------------------- Debridement Details Patient Name: Date of Service: [name], [name] 12/31/2020 9:15 A M Medical Record Number: [id number] Patient Account Number: [id number] Date of Birth/Sex: Treating RN: 02/09/1976 (44 y.o. [name], [name] Primary Care Provider: Cristie [name] Other Clinician: Referring Provider: Treating Provider/Extender: [name] in Treatment: 24 Debridement Performed for Assessment: Wound #1 Right Calcaneus Performed By: Physician [name] Shan, DO Debridement Type: Debridement Level of Consciousness (Pre-procedure): Awake and Alert Pre-procedure Verification/Time Out Yes - 11:08 Taken: Start Time: 11:09 Pain Control: Lidocaine 4% T opical Solution T Area Debrided (L x W): otal 3.1 (cm) x 1 (cm) = 3.1 (cm) Tissue and other material debrided: Viable, Non-Viable, Callus, Slough, Subcutaneous, Skin: Dermis , Skin: Epidermis, Fibrin/Exudate, Slough Level: Skin/Subcutaneous Tissue Debridement Description: Excisional Instrument: Curette Bleeding: Minimum Hemostasis Achieved: Pressure End Time: 11:12 Procedural Pain: 0 Post Procedural Pain: 0 Response to Treatment: Procedure was tolerated well Level of Consciousness  (Post- Awake and Alert procedure): Post Debridement Measurements of Total Wound Length: (cm) 3.1 Stage: Category/Stage III Width: (cm) 1 Depth: (cm) 0.4 Volume: (cm) 0.974 Character of Wound/Ulcer Post Debridement: Improved Post Procedure Diagnosis Same as Pre-procedure Electronic Signature(s) Signed: 12/31/2020 11:40:38 AM By: [name] Shan DO Signed: 12/31/2020 6:02:39 PM By: [name] Entered By: [name] on 12/31/2020 11:12:59 -------------------------------------------------------------------------------- Debridement Details Patient Name: Date of Service: [name], [name] 12/31/2020 9:15 A M Medical Record Number: [id number] Patient Account Number: [id number] Date of Birth/Sex: Treating RN: November 16, 1975 (45 y.o. [name], [name] Primary Care Provider: Cristie [name] Other Clinician: Referring Provider: Treating Provider/Extender: [name] in Treatment: 24 Debridement Performed for Assessment: Wound #2 Left Calcaneus Performed By: Physician [name] Shan, DO Debridement Type: Debridement Level of Consciousness (Pre-procedure): Awake and Alert Pre-procedure Verification/Time Out Yes - 11:08 Taken: Start Time: 11:09 Pain Control: Lidocaine 4% T opical Solution T Area Debrided (L x W): otal 4.5 (cm) x 1.5 (cm) = 6.75 (cm) Tissue and other material debrided: Viable, Non-Viable, Callus, Slough, Subcutaneous, Skin: Dermis , Skin: Epidermis, Fibrin/Exudate, Slough Level: Skin/Subcutaneous Tissue Debridement Description: Excisional Instrument: Curette Bleeding: Minimum Hemostasis Achieved: Pressure End Time: 11:12 Procedural Pain: 0 Post Procedural Pain: 0 Response to Treatment: Procedure was tolerated well Level of Consciousness (Post- Awake and Alert procedure): Post Debridement Measurements of Total Wound Length: (cm) 4.5 Stage: Category/Stage III Width: (cm) 1.5 Depth: (cm) 0.2 Volume: (cm) 1.06 Character of Wound/Ulcer  Post Debridement: Improved Post Procedure Diagnosis Same as Pre-procedure Electronic Signature(s) Signed: 12/31/2020 11:40:38 AM By: [name] Shan DO Signed: 12/31/2020 6:02:39 PM By: [name] Entered By: [name] on 12/31/2020 11:13:22 -------------------------------------------------------------------------------- HPI Details Patient Name: Date of Service: [name], [name] 12/31/2020 9:15 A M Medical Record Number: [id number] Patient Account Number: [id number] Date of Birth/Sex: Treating RN: 07/17/1976 (45 y.o. [name] Primary Care Provider: Cristie [name] Other Clinician: Referring Provider: Treating Provider/Extender: [name] in Treatment: 24 History of Present Illness HPI Description:  07/15/2020 upon evaluation today patient presents for initial inspection here in our clinic concerning issues he has been having with the bottoms of his feet bilaterally. He states these actually occurred as wounds when he was hospitalized for 5 months secondary to Covid. He was apparently with tilting bed where he was in an upright position quite frequently and apparently this occurred in some way shape or form during that time. Fortunately there is no sign of active infection at this time. No fevers, chills, nausea, vomiting, or diarrhea. With that being said he still has substantial wounds on the plantar aspects of his feet Theragen require quite a bit of work to get these to heal. He has been using Santyl currently though that is been problematic both in receiving the medication as well as actually paid for it as it is become quite expensive. Prior to the experience with Covid the patient really did not have any major medical problems other than hypertension he does have some mild generalized weakness following the Covid experience. 07/22/2020 on evaluation today patient appears to be doing okay in regard to his foot ulcers I feel like the wound beds are  showing signs of better improvement that I do believe the Iodoflex is helping in this regard. With that being said he does have a lot of drainage currently and this is somewhat blue/green in nature which is consistent with Pseudomonas. I do think a culture today would be appropriate for [place] to evaluate and see if that is indeed the case I would likely start him on antibiotic orally as well he is not allergic to Cipro knows of no issues he has had in the past 12/21; patient was admitted to the clinic earlier this month with bilateral presumed pressure ulcers on the bottom of his feet apparently related to excessive pressure from a tilt table arrangement in the intensive care unit. Patient relates this to being on ECMO but I am not really sure that is exactly related to that. I must say I have never seen anything like this. He has fairly extensive full-thickness wounds extending from his heel towards his midfoot mostly centered laterally. There is already been some healing distally. He does not appear to have an arterial issue. He has been using gentamicin to the wound surfaces with Iodoflex to help with ongoing debridement 1/6; this is a patient with pressure ulcers on the bottom of his feet related to excessive pressure from a standing position in the intensive care unit. He is complaining of a lot of pain in the right heel. He is not a diabetic. 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 We have been using Iodoflex to help prepare the surfaces of both wounds for an advanced treatment product. He is nonambulatory spending most of his time in a wheelchair I have asked him not to propel the wheelchair with his heels 1/13; in general his wounds look better not much surface area change we have been using Iodoflex as of last week. I did an x-ray of the right heel as the patient was complaining of pain. I had some thoughts about a stress fracture perhaps Achilles tendon problems  however what it showed was erosive changes along the inferior aspect of the calcaneus he now has a MRI booked for 1/20. 1/20; in general his wounds continue to be better. Some improvement in the large narrow areas proximally in his foot. He is still complaining of pain in the right heel and tenderness in certain  areas of this wound. His MRI is tonight. I am not just looking for osteomyelitis that was brought up on the x-ray I am wondering about stress fractures, tendon ruptures etc. He has no such findings on the left. Also noteworthy is that the patient had critical illness neuropathy and some of the discomfort may be actual improvement in nerve function I am just not sure. These wounds were initially in the setting of severe critical illness related to COVID-19. He was put in a standing position. He may have also been on pressors at the point contributing to tissue ischemia. By his description at some point these wounds were grossly necrotic extending proximally up into the Achilles part of his heel. I do not know that I have ever really seen pictures of them like this although they may exist in epic We have ordered Tri layer Oasis. I am trying to stimulate some granulation in these areas. This is of course assuming the MRI is negative for infection 1/27; since the patient was last here he saw Dr. Juleen China of infectious disease. He is planned for vancomycin and ceftriaxone. Prior operative culture grew MSSA. Also ordered baseline lab work. He also ordered arterial studies although the ABIs in our clinic were normal as well as his clinical exam these were normal I do not think he needs to see vascular surgery. His ABIs at the [facility] were 1.22 in the right triphasic waveforms with a normal TBI of 1.15 on the left ABI of 1.22 with triphasic waveforms and a normal TBI of 1.08. Finally he saw Dr. Amalia Hailey who will follow him in for 2 months. At this point I do not think he felt that he needed a procedure on the  right calcaneal bone. Dr. Juleen China is elected for broad-spectrum antibiotic The patient is still having pain in the right heel. He walks with a walker 2/3; wounds are generally smaller. He is tolerating his IV antibiotics. I believe this is vancomycin and ceftriaxone. We are still waiting for Oasis burn in terms of his out-of-pocket max which he should be meeting soon given the IV antibiotics, MRIs etc. I have asked him to check in on this. We are using silver collagen in the meantime the wounds look better 2/10; tolerating IV vancomycin and Rocephin. We are waiting to apply for Oasis. Although I am not really sure where he is in his out-of-pocket max. 2/17 started the first application of Oasis trilayer. Still on antibiotics. The wounds have generally look better. The area on the left has a little more surface slough requiring debridement 5/20; second application of Oasis trilayer. The wound surface granulation is generally look better. The area on the left with undermining laterally I think is come in a bit. 10/08/2020 upon evaluation today patient is here today for [employer] application #3. Fortunately he seems to be doing extremely well with regard to this and we are seeing a lot of new epithelial growth which is great news. Fortunately there is no signs of active infection at this time. 10/16/2020 upon evaluation today patient appears to be doing well with regard to his foot ulcers. Do believe the Oasis has been of benefit for him. I do not see any signs of infection right now which is great news and I think that he has a lot of new epithelial growth which is great to see as well. The patient is very pleased to hear all of this. I do think we can proceed with the Oasis trilayer #4  today. 3/18; not as much improvement in these areas on his heels that I was hoping. I did reapply trilateral Oasis today the tissue looks healthier but not as much fill in as I was hoping. 3/25; better looking today  I think this is come in a bit the tissue looks healthier. Triple layer Oasis reapplied #6 4/1; somewhat better looking definitely better looking surface not as much change in surface area as I was hoping. He may be spending more time [place] on days then he needs to although he does have heel offloading boots. Triple layer Oasis reapplied #7 4/7; unfortunately apparently [facility] will not approve any further Oasis which is unfortunate since the patient did respond nicely both in terms of the condition of the wound bed as well as surface area. There is still some drainage coming from the wound but not a lot there does not appear to be any infection 4/15; we have been using Hydrofera Blue. He continues to have nice rims of epithelialization on the right greater than the left. The left the epithelialization is coming from the tip of his heel. There is moderate drainage. In this that concerns me about a total contact cast. There is no evidence of infection 4/29; patient has been using Hydrofera Blue with dressing changes. He has no complaints or issues today. 5/5; using Hydrofera Blue. I actually think that he looks marginally better than the last time I saw this 3 weeks ago. There are rims of epithelialization on the left thumb coming from the medial side on the right. Using Hydrofera Blue 5/12; using Hydrofera Blue. These continue to make improvements in surface area. His drainage was not listed as severe I therefore went ahead and put a cast on the left foot. Right foot we will continue to dress his previous 5/16; back for first total contact cast change. He did not tolerate this particularly well cast injury on the anterior tibia among other issues. Difficulty sleeping. I talked him about this in some detail and afterwards is elected to continue. I told him I would like to have a cast on for 3 weeks to see if this is going to help at all. I think he agreed 5/19; I think the wound is  better. There is no tunneling towards his midfoot. The undermining medially also looks better. He has a rim of new skin distally. I think we are making progress here. The area on the left also continues to look somewhat better to me using Hydrofera Blue. He has a list of complaints about the cast but none of them seem serious 5/26; patient presents for 1 week follow-up. He has been using a total contact cast and tolerating this well. Hydrofera Blue is the main dressing used. He denies signs of infection. Electronic Signature(s) Signed: 12/31/2020 11:40:38 AM By: [name] Shan DO Entered By: [name] on 12/31/2020 11:37:19 -------------------------------------------------------------------------------- Physical Exam Details Patient Name: Date of Service: [name], TO [place] 12/31/2020 9:15 A M Medical Record Number: [id number] Patient Account Number: [id number] Date of Birth/Sex: Treating RN: 15-Dec-1975 (45 y.o. [name] Primary Care Provider: Cristie [name] Other Clinician: Referring Provider: Treating Provider/Extender: [name] in Treatment: 24 Constitutional respirations regular, non-labored and within target range for patient.. Cardiovascular 2+ dorsalis pedis/posterior tibialis pulses. Psychiatric pleasant and cooperative. Notes Bilateral heel wounds with nonviable tissue present. Granulation tissue in the wound bed post-debridement. No signs of infection. Electronic Signature(s) Signed: 12/31/2020 11:40:38 AM By: [name] Shan  DO Entered By: [name] on 12/31/2020 11:38:16 -------------------------------------------------------------------------------- Physician Orders Details Patient Name: Date of Service: [name] [place] 12/31/2020 9:15 A M Medical Record Number: [id number] Patient Account Number: [id number] Date of Birth/Sex: Treating RN: 1975-09-24 (45 y.o. 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 - Dr. Dellia Nims Thursday for [facility] appt. ****60 minutes for cast changes**** Return A Bathing/ Shower/ Hygiene May shower with protection but do not get wound dressing(s) wet. Edema Control - Lymphedema / SCD / Other Bilateral Lower Extremities Elevate legs to the level of the heart or above for 30 minutes daily and/or when sitting, a frequency of: Avoid standing for long periods of time. Off-Loading Total Contact Cast to Left Lower Extremity Wedge shoe to: - Glophed Shoe to right foot Other: - keep pressure off of the bottom of your feet Additional Orders / Instructions Follow Nutritious Diet Wound Treatment Wound #1 - Calcaneus Wound Laterality: Right Cleanser: Normal Saline (Generic) Every Other Day/15 Days Discharge Instructions: Cleanse the wound with Normal Saline prior to applying a clean dressing using gauze sponges, not tissue or cotton balls. Cleanser: Wound Cleanser Every Other Day/15 Days Discharge Instructions: Cleanse the wound with wound cleanser prior to applying a clean dressing using gauze sponges, not tissue or cotton balls. Prim Dressing: Hydrofera Blue Classic Foam, 4x4 in (Dispense As Written) Every Other Day/15 Days ary Discharge Instructions: Moisten with saline prior to applying to wound bed Secondary Dressing: ABD Pad, 5x9 Every Other Day/15 Days Discharge Instructions: Apply over primary dressing as directed. Secured With: Elastic Bandage 4  inch (ACE bandage) Every Other Day/15 Days Discharge Instructions: Secure with ACE bandage as directed. Secured With: [employer], 4.5x3.1 (in/yd) (Generic) Every Other Day/15 Days Discharge Instructions: Secure with Kerlix as directed. Wound #2 - Calcaneus Wound Laterality: Left Cleanser: Normal Saline (Generic) Every Other Day/15 Days Discharge Instructions: Cleanse the wound with Normal Saline prior to applying a clean dressing using gauze sponges, not tissue or cotton balls. Cleanser: Wound Cleanser Every Other Day/15 Days Discharge Instructions: Cleanse the wound with wound cleanser prior to applying a clean dressing using gauze sponges, not tissue or cotton balls. Peri-Wound Care: Zinc Oxide Ointment 30g tube Every Other Day/15 Days Discharge Instructions: Apply Zinc Oxide to periwound with each dressing change Prim Dressing: Hydrofera Blue Classic Foam, 4x4 in (Dispense As Written) Every Other Day/15 Days ary Discharge Instructions: Moisten with saline prior to applying to wound bed Secondary Dressing: Woven Gauze Sponge, Non-Sterile 4x4 in (Generic) Every Other Day/15 Days Discharge Instructions: Apply over primary dressing as directed. Secondary Dressing: ABD Pad, 8x10 (Generic) Every Other Day/15 Days Discharge Instructions: Apply over primary dressing as directed. Secondary Dressing: Zetuvit Plus 4x8 in (Generic) Every Other Day/15 Days Discharge Instructions: Apply over primary dressing as directed. Secured With: [employer], 4.5x3.1 (in/yd) (Generic) Every Other Day/15 Days Discharge Instructions: Secure with Kerlix as directed. Electronic Signature(s) Signed: 12/31/2020 11:40:38 AM By: [name] Shan DO Entered By: [name] on 12/31/2020 11:38:37 -------------------------------------------------------------------------------- Problem List Details Patient Name: Date of Service: [name], TO  [place] E. 12/31/2020 9:15 A M Medical Record Number:  [id number] Patient Account Number: [id number] Date of Birth/Sex: Treating RN: [birth year] (45 y.o. [name] Primary Care Provider: Cristie [name] Other Clinician: Referring Provider: Treating Provider/Extender: [name] in Treatment: 24 Active Problems ICD-10 Encounter Code Description Active Date MDM Diagnosis [phone] Pressure ulcer of other site, stage 3 07/15/2020 No Yes M62.81 Muscle weakness (generalized) 07/15/2020 No Yes I10 Essential (primary) hypertension 07/15/2020 No Yes L97.512 Non-pressure chronic ulcer of other part of right foot with fat layer exposed 09/03/2020 No Yes L97.522 Non-pressure chronic ulcer of other 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 09/03/2020 No Yes Inactive Problems ICD-10 Code Description Active Date Inactive Date M86.171 Other acute osteomyelitis, right ankle and foot 09/03/2020 09/03/2020 Resolved Problems Electronic Signature(s) Signed: 12/31/2020 11:40:38 AM By: [name] Shan DO Entered By: [name] on 12/31/2020 11:36:26 -------------------------------------------------------------------------------- Progress Note Details Patient Name: Date of Service: [name], [place] 12/31/2020 9:15 A M Medical Record Number: [id number] Patient Account Number: [id number] Date of Birth/Sex: Treating RN: 1976/08/01 (45 y.o. [name] Primary Care Provider: Cristie [name] Other Clinician: Referring Provider: Treating Provider/Extender: [name] in Treatment: 24 Subjective Chief Complaint Information obtained from Patient Bilateral Plantar Foot Ulcers History of Present Illness (HPI) 07/15/2020 upon evaluation today patient presents for initial inspection here in our clinic concerning issues he has been having with the bottoms of his feet bilaterally. He states these actually occurred as wounds when he was hospitalized for 5 months secondary to Covid. He was apparently with tilting bed  where he was in an upright position quite frequently and apparently this occurred in some way shape or form during that time. Fortunately there is no sign of active infection at this time. No fevers, chills, nausea, vomiting, or diarrhea. With that being said he still has substantial wounds on the plantar aspects of his feet Theragen require quite a bit of work to get these to heal. He has been using Santyl currently though that is been problematic both in receiving the medication as well as actually paid for it as it is become quite expensive. Prior to the experience with Covid the patient really did not have any major medical problems other than hypertension he does have some mild generalized weakness following the Covid experience. 07/22/2020 on evaluation today patient appears to be doing okay in regard to his foot ulcers I feel like the wound beds are showing signs of better improvement that I do believe the Iodoflex is helping in this regard. With that being said he does have a lot of drainage currently and this is somewhat blue/green in nature which is consistent with Pseudomonas. I do think a culture today would be appropriate for [place] to evaluate and see if that is indeed the case I would likely start him on antibiotic orally as well he is not allergic to Cipro knows of no issues he has had in the past 12/21; patient was admitted to the clinic earlier this month with bilateral presumed pressure ulcers on the bottom of his feet apparently related to excessive pressure from a tilt table arrangement in the intensive care unit. Patient relates this to being on ECMO but I am not really sure that is exactly related to that. I must say I have never seen anything like this. He has fairly extensive full-thickness wounds extending from his heel towards his midfoot mostly centered laterally. There is already been some healing distally. He  does not appear to have an arterial issue. He has been using  gentamicin to the wound surfaces with Iodoflex to help with ongoing debridement 1/6; this is a patient with pressure ulcers on the bottom of his feet related to excessive pressure from a standing position in the intensive care unit. He is complaining of a lot of pain in the right heel. He is not a diabetic. 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 We have been using Iodoflex to help prepare the surfaces of both wounds for an advanced treatment product. He is nonambulatory spending most of his time in a wheelchair I have asked him not to propel the wheelchair with his heels 1/13; in general his wounds look better not much surface area change we have been using Iodoflex as of last week. I did an x-ray of the right heel as the patient was complaining of pain. I had some thoughts about a stress fracture perhaps Achilles tendon problems however what it showed was erosive changes along the inferior aspect of the calcaneus he now has a MRI booked for 1/20. 1/20; in general his wounds continue to be better. Some improvement in the large narrow areas proximally in his foot. He is still complaining of pain in the right heel and tenderness in certain areas of this wound. His MRI is tonight. I am not just looking for osteomyelitis that was brought up on the x-ray I am wondering about stress fractures, tendon ruptures etc. He has no such findings on the left. Also noteworthy is that the patient had critical illness neuropathy and some of the discomfort may be actual improvement in nerve function I am just not sure. These wounds were initially in the setting of severe critical illness related to COVID-19. He was put in a standing position. He may have also been on pressors at the point contributing to tissue ischemia. By his description at some point these wounds were grossly necrotic extending proximally up into the Achilles part of his heel. I do not know that I have ever really seen  pictures of them like this although they may exist in epic We have ordered Tri layer Oasis. I am trying to stimulate some granulation in these areas. This is of course assuming the MRI is negative for infection 1/27; since the patient was last here he saw Dr. Juleen China of infectious disease. He is planned for vancomycin and ceftriaxone. Prior operative culture grew MSSA. Also ordered baseline lab work. He also ordered arterial studies although the ABIs in our clinic were normal as well as his clinical exam these were normal I do not think he needs to see vascular surgery. His ABIs at the [facility] were 1.22 in the right triphasic waveforms with a normal TBI of 1.15 on the left ABI of 1.22 with triphasic waveforms and a normal TBI of 1.08. Finally he saw Dr. Amalia Hailey who will follow him in for 2 months. At this point I do not think he felt that he needed a procedure on the right calcaneal bone. Dr. Juleen China is elected for broad-spectrum antibiotic The patient is still having pain in the right heel. He walks with a walker 2/3; wounds are generally smaller. He is tolerating his IV antibiotics. I believe this is vancomycin and ceftriaxone. We are still waiting for Oasis burn in terms of his out-of-pocket max which he should be meeting soon given the IV antibiotics, MRIs etc. I have asked him to check in on this. We are using  silver collagen in the meantime the wounds look better 2/10; tolerating IV vancomycin and Rocephin. We are waiting to apply for Oasis. Although I am not really sure where he is in his out-of-pocket max. 2/17 started the first application of Oasis trilayer. Still on antibiotics. The wounds have generally look better. The area on the left has a little more surface slough requiring debridement 2/50; second application of Oasis trilayer. The wound surface granulation is generally look better. The area on the left with undermining laterally I think is come in a bit. 10/08/2020 upon evaluation today  patient is here today for [employer] application #3. Fortunately he seems to be doing extremely well with regard to this and we are seeing a lot of new epithelial growth which is great news. Fortunately there is no signs of active infection at this time. 10/16/2020 upon evaluation today patient appears to be doing well with regard to his foot ulcers. Do believe the Oasis has been of benefit for him. I do not see any signs of infection right now which is great news and I think that he has a lot of new epithelial growth which is great to see as well. The patient is very pleased to hear all of this. I do think we can proceed with the Oasis trilayer #4 today. 3/18; not as much improvement in these areas on his heels that I was hoping. I did reapply trilateral Oasis today the tissue looks healthier but not as much fill in as I was hoping. 3/25; better looking today I think this is come in a bit the tissue looks healthier. Triple layer Oasis reapplied #6 4/1; somewhat better looking definitely better looking surface not as much change in surface area as I was hoping. He may be spending more time [place] on days then he needs to although he does have heel offloading boots. Triple layer Oasis reapplied #7 4/7; unfortunately apparently [facility] will not approve any further Oasis which is unfortunate since the patient did respond nicely both in terms of the condition of the wound bed as well as surface area. There is still some drainage coming from the wound but not a lot there does not appear to be any infection 4/15; we have been using Hydrofera Blue. He continues to have nice rims of epithelialization on the right greater than the left. The left the epithelialization is coming from the tip of his heel. There is moderate drainage. In this that concerns me about a total contact cast. There is no evidence of infection 4/29; patient has been using Hydrofera Blue with dressing changes. He has no  complaints or issues today. 5/5; using Hydrofera Blue. I actually think that he looks marginally better than the last time I saw this 3 weeks ago. There are rims of epithelialization on the left thumb coming from the medial side on the right. Using Hydrofera Blue 5/12; using Hydrofera Blue. These continue to make improvements in surface area. His drainage was not listed as severe I therefore went ahead and put a cast on the left foot. Right foot we will continue to dress his previous 5/16; back for first total contact cast change. He did not tolerate this particularly well cast injury on the anterior tibia among other issues. Difficulty sleeping. I talked him about this in some detail and afterwards is elected to continue. I told him I would like to have a cast on for 3 weeks to see if this is going to help  at all. I think he agreed 5/19; I think the wound is better. There is no tunneling towards his midfoot. The undermining medially also looks better. He has a rim of new skin distally. I think we are making progress here. The area on the left also continues to look somewhat better to me using Hydrofera Blue. He has a list of complaints about the cast but none of them seem serious 5/26; patient presents for 1 week follow-up. He has been using a total contact cast and tolerating this well. Hydrofera Blue is the main dressing used. He denies signs of infection. Patient History Information obtained from Patient. Family History Cancer - Maternal Grandparents, Diabetes - Father,Paternal Grandparents, Heart Disease - Maternal Grandparents, Hypertension - Father,Paternal Grandparents, Lung Disease - Siblings, No family history of Hereditary Spherocytosis, Kidney Disease, Seizures, Stroke, Thyroid Problems, Tuberculosis. Social History Never smoker, Marital Status - Married, Alcohol Use - Never, Drug Use - No History, Caffeine Use - Daily - tea, soda. Medical History Eyes Denies history of Cataracts,  Glaucoma, Optic Neuritis Ear/Nose/Mouth/Throat Denies history of Chronic sinus problems/congestion, Middle ear problems Hematologic/Lymphatic Denies history of Anemia, Hemophilia, Human Immunodeficiency Virus, Lymphedema, Sickle Cell Disease Respiratory Patient has history of Asthma Denies history of Aspiration, Chronic Obstructive Pulmonary Disease (COPD), Pneumothorax, Sleep Apnea, Tuberculosis Cardiovascular Patient has history of Angina - with COVID, Hypertension Denies history of Arrhythmia, Congestive Heart Failure, Coronary Artery Disease, Deep Vein Thrombosis, Hypotension, Myocardial Infarction, Peripheral Arterial Disease, Peripheral Venous Disease, Phlebitis, Vasculitis Gastrointestinal Denies history of Cirrhosis , Colitis, Crohnoos, Hepatitis A, Hepatitis B, Hepatitis C Endocrine Denies history of Type I Diabetes, Type II Diabetes Genitourinary Denies history of End Stage Renal Disease Immunological Denies history of Lupus Erythematosus, Raynaudoos, Scleroderma Integumentary (Skin) Denies history of History of Burn Musculoskeletal Denies history of Gout, Rheumatoid Arthritis, Osteoarthritis, Osteomyelitis Neurologic Denies history of Dementia, Neuropathy, Quadriplegia, Paraplegia, Seizure Disorder Oncologic Denies history of Received Chemotherapy, Received Radiation Psychiatric Denies history of Anorexia/bulimia, Confinement Anxiety Hospitalization/Surgery History - COVID PNA 07/22/2019- 11/14/2019. - 03/27/2020 wound debridement/ skin graft. Medical A Surgical History Notes nd Constitutional Symptoms (General Health) COVID PNA 07/22/2019-11/14/2019 [facility] ECMO, foot drop left foot , Genitourinary kidney stone Psychiatric anxiety Objective Constitutional respirations regular, non-labored and within target range for patient.. Vitals Time Taken: 9:47 AM, Height: 69 in, Weight: 280 lbs, BMI: 41.3, Temperature: 98.7 F, Pulse: 83 bpm, Respiratory Rate: 16  breaths/min, Blood Pressure: 161/98 mmHg. Cardiovascular 2+ dorsalis pedis/posterior tibialis pulses. Psychiatric pleasant and cooperative. General Notes: Bilateral heel wounds with nonviable tissue present. Granulation tissue in the wound bed post-debridement. No signs of infection. Integumentary (Hair, Skin) Wound #1 status is Open. Original cause of wound was Pressure Injury. The date acquired was: 10/07/2019. The wound has been in treatment 24 weeks. The wound is located on the Right Calcaneus. The wound measures 3.1cm length x 1cm width x 0.4cm depth; 2.435cm^2 area and 0.974cm^3 volume. There is Fat Layer (Subcutaneous Tissue) exposed. There is no tunneling or undermining noted. There is a medium amount of drainage noted. The wound margin is distinct with the outline attached to the wound base. There is large (67-100%) red, pink granulation within the wound bed. There is a small (1-33%) amount of necrotic tissue within the wound bed including Adherent Slough. Wound #2 status is Open. Original cause of wound was Pressure Injury. The date acquired was: 10/07/2019. The wound has been in treatment 24 weeks. The wound is located on the Left Calcaneus. The wound measures 4.5cm length x 1.5cm width  x 0.2cm depth; 5.301cm^2 area and 1.06cm^3 volume. There is Fat Layer (Subcutaneous Tissue) exposed. There is no tunneling or undermining noted. There is a medium amount of serosanguineous drainage noted. The wound margin is distinct with the outline attached to the wound base. There is large (67-100%) pink granulation within the wound bed. There is a small (1-33%) amount of necrotic tissue within the wound bed including Adherent Slough. Assessment Active Problems ICD-10 Pressure ulcer of other site, stage 3 Muscle weakness (generalized) Essential (primary) hypertension Non-pressure chronic ulcer of other part of right foot with fat layer exposed Non-pressure chronic ulcer of other part of left foot  with fat layer exposed Patient presents for follow-up of his bilateral heel wounds. He has been casted on the left side with Hydrofera Blue underneath. Both wounds appear healing. There was some nonviable tissue and I removed this today. This is a patient of Dr. Dellia Nims and he will follow-up next week. Procedures Wound #1 Pre-procedure diagnosis of Wound #1 is a Pressure Ulcer located on the Right Calcaneus . There was a Excisional Skin/Subcutaneous Tissue Debridement with a total area of 3.1 sq cm performed by [name] Shan, DO. With the following instrument(s): Curette to remove Viable and Non-Viable tissue/material. Material removed includes Callus, Subcutaneous Tissue, Slough, Skin: Dermis, Skin: Epidermis, and Fibrin/Exudate after achieving pain control using Lidocaine 4% T opical Solution. A time out was conducted at 11:08, prior to the start of the procedure. A Minimum amount of bleeding was controlled with Pressure. The procedure was tolerated well with a pain level of 0 throughout and a pain level of 0 following the procedure. Post Debridement Measurements: 3.1cm length x 1cm width x 0.4cm depth; 0.974cm^3 volume. Post debridement Stage noted as Category/Stage III. Character of Wound/Ulcer Post Debridement is improved. Post procedure Diagnosis Wound #1: Same as Pre-Procedure Wound #2 Pre-procedure diagnosis of Wound #2 is a Pressure Ulcer located on the Left Calcaneus . There was a Excisional Skin/Subcutaneous Tissue Debridement with a total area of 6.75 sq cm performed by [name] Shan, DO. With the following instrument(s): Curette to remove Viable and Non-Viable tissue/material. Material removed includes Callus, Subcutaneous Tissue, Slough, Skin: Dermis, Skin: Epidermis, and Fibrin/Exudate after achieving pain control using Lidocaine 4% T opical Solution. A time out was conducted at 11:08, prior to the start of the procedure. A Minimum amount of bleeding was controlled with Pressure.  The procedure was tolerated well with a pain level of 0 throughout and a pain level of 0 following the procedure. Post Debridement Measurements: 4.5cm length x 1.5cm width x 0.2cm depth; 1.06cm^3 volume. Post debridement Stage noted as Category/Stage III. Character of Wound/Ulcer Post Debridement is improved. Post procedure Diagnosis Wound #2: Same as Pre-Procedure Pre-procedure diagnosis of Wound #2 is a Pressure Ulcer located on the Left Calcaneus . There was a T Programmer, multimedia Procedure by [name], otal DO. Post procedure Diagnosis Wound #2: Same as Pre-Procedure Plan Follow-up Appointments: Return Appointment in 1 week. - Dr. Dellia Nims Thursday for [facility] appt. ****60 minutes for cast changes**** Bathing/ Shower/ Hygiene: May shower with protection but do not get wound dressing(s) wet. Edema Control - Lymphedema / SCD / Other: Elevate legs to the level of the heart or above for 30 minutes daily and/or when sitting, a frequency of: Avoid standing for long periods of time. Off-Loading: T Contact Cast to Left Lower Extremity otal Wedge shoe to: - Glophed Shoe to right foot Other: - keep pressure off of the bottom of your feet Additional Orders /  Instructions: Follow Nutritious Diet WOUND #1: - Calcaneus Wound Laterality: Right Cleanser: Normal Saline (Generic) Every Other Day/15 Days Discharge Instructions: Cleanse the wound with Normal Saline prior to applying a clean dressing using gauze sponges, not tissue or cotton balls. Cleanser: Wound Cleanser Every Other Day/15 Days Discharge Instructions: Cleanse the wound with wound cleanser prior to applying a clean dressing using gauze sponges, not tissue or cotton balls. Prim Dressing: Hydrofera Blue Classic Foam, 4x4 in (Dispense As Written) Every Other Day/15 Days ary Discharge Instructions: Moisten with saline prior to applying to wound bed Secondary Dressing: ABD Pad, 5x9 Every Other Day/15 Days Discharge Instructions: Apply over  primary dressing as directed. Secured With: Elastic Bandage 4 inch (ACE bandage) Every Other Day/15 Days Discharge Instructions: Secure with ACE bandage as directed. Secured With: [employer], 4.5x3.1 (in/yd) (Generic) Every Other Day/15 Days Discharge Instructions: Secure with Kerlix as directed. WOUND #2: - Calcaneus Wound Laterality: Left Cleanser: Normal Saline (Generic) Every Other Day/15 Days Discharge Instructions: Cleanse the wound with Normal Saline prior to applying a clean dressing using gauze sponges, not tissue or cotton balls. Cleanser: Wound Cleanser Every Other Day/15 Days Discharge Instructions: Cleanse the wound with wound cleanser prior to applying a clean dressing using gauze sponges, not tissue or cotton balls. Peri-Wound Care: Zinc Oxide Ointment 30g tube Every Other Day/15 Days Discharge Instructions: Apply Zinc Oxide to periwound with each dressing change Prim Dressing: Hydrofera Blue Classic Foam, 4x4 in (Dispense As Written) Every Other Day/15 Days ary Discharge Instructions: Moisten with saline prior to applying to wound bed Secondary Dressing: Woven Gauze Sponge, Non-Sterile 4x4 in (Generic) Every Other Day/15 Days Discharge Instructions: Apply over primary dressing as directed. Secondary Dressing: ABD Pad, 8x10 (Generic) Every Other Day/15 Days Discharge Instructions: Apply over primary dressing as directed. Secondary Dressing: Zetuvit Plus 4x8 in (Generic) Every Other Day/15 Days Discharge Instructions: Apply over primary dressing as directed. Secured With: [employer], 4.5x3.1 (in/yd) (Generic) Every Other Day/15 Days Discharge Instructions: Secure with Kerlix as directed. 1. T contact cast with Hydrofera Blue otal 2. In office sharp debridement 3. Hydrofera Blue to the right heel wound 4. Follow-up in 1 week Electronic Signature(s) Signed: 12/31/2020 11:40:38 AM By: [name] Shan DO Entered By: [name] on 12/31/2020  11:39:52 -------------------------------------------------------------------------------- HxROS Details Patient Name: Date of Service: [name], [place] 12/31/2020 9:15 A M Medical Record Number: [id number] Patient Account Number: [id number] Date of Birth/Sex: Treating RN: 10-01-1975 (45 y.o. [name] Primary Care Provider: Cristie [name] Other Clinician: Referring Provider: Treating Provider/Extender: [name] in Treatment: 24 Information Obtained From Patient Constitutional Symptoms (General Health) Medical History: Past Medical History Notes: COVID PNA 07/22/2019-11/14/2019 [facility] ECMO, foot drop left foot , Eyes Medical History: Negative for: Cataracts; Glaucoma; Optic Neuritis Ear/Nose/Mouth/Throat Medical History: Negative for: Chronic sinus problems/congestion; Middle ear problems Hematologic/Lymphatic Medical History: Negative for: Anemia; Hemophilia; Human Immunodeficiency Virus; Lymphedema; Sickle Cell Disease Respiratory Medical History: Positive for: Asthma Negative for: Aspiration; Chronic Obstructive Pulmonary Disease (COPD); Pneumothorax; Sleep Apnea; Tuberculosis Cardiovascular Medical History: Positive for: Angina - with COVID; Hypertension Negative for: Arrhythmia; Congestive Heart Failure; Coronary Artery Disease; Deep Vein Thrombosis; Hypotension; Myocardial Infarction; Peripheral Arterial Disease; Peripheral Venous Disease; Phlebitis; Vasculitis Gastrointestinal Medical History: Negative for: Cirrhosis ; Colitis; Crohns; Hepatitis A; Hepatitis B; Hepatitis C Endocrine Medical History: Negative for: Type I Diabetes; Type II Diabetes Genitourinary Medical History: Negative for: End Stage Renal Disease Past Medical History Notes: kidney stone Immunological Medical History: Negative for:  Lupus Erythematosus; Raynauds; Scleroderma Integumentary (Skin) Medical History: Negative for: History of  Burn Musculoskeletal Medical History: Negative for: Gout; Rheumatoid Arthritis; Osteoarthritis; Osteomyelitis Neurologic Medical History: Negative for: Dementia; Neuropathy; Quadriplegia; Paraplegia; Seizure Disorder Oncologic Medical History: Negative for: Received Chemotherapy; Received Radiation Psychiatric Medical History: Negative for: Anorexia/bulimia; Confinement Anxiety Past Medical History Notes: anxiety Immunizations Pneumococcal Vaccine: Received Pneumococcal Vaccination: No Implantable Devices None Hospitalization / Surgery History Type of Hospitalization/Surgery COVID PNA 07/22/2019- 11/14/2019 03/27/2020 wound debridement/ skin graft Family and Social History Cancer: Yes - Maternal Grandparents; Diabetes: Yes - Father,Paternal Grandparents; Heart Disease: Yes - Maternal Grandparents; Hereditary Spherocytosis: No; Hypertension: Yes - Father,Paternal Grandparents; Kidney Disease: No; Lung Disease: Yes - Siblings; Seizures: No; Stroke: No; Thyroid Problems: No; Tuberculosis: No; Never smoker; Marital Status - Married; Alcohol Use: Never; Drug Use: No History; Caffeine Use: Daily - tea, soda; Financial Concerns: No; Food, Clothing or Shelter Needs: No; Support System Lacking: No; Transportation Concerns: No Electronic Signature(s) Signed: 12/31/2020 11:40:38 AM By: [name] Shan DO Signed: 12/31/2020 6:02:39 PM By: [name] Entered By: [name] on 12/31/2020 11:37:24 -------------------------------------------------------------------------------- Total Contact Cast Details Patient Name: Date of Service: [name], [place] 12/31/2020 9:15 A M Medical Record Number: [id number] Patient Account Number: [id number] Date of Birth/Sex: Treating RN: Sep 12, 1975 (45 y.o. [name] Primary Care Provider: Cristie [name] Other Clinician: Referring Provider: Treating Provider/Extender: [name] in Treatment: 24 T Contact Cast Applied  for Wound Assessment: otal Wound #2 Left Calcaneus Performed By: Physician [name] Shan, DO Post Procedure Diagnosis Same as Pre-procedure Electronic Signature(s) Signed: 12/31/2020 11:40:38 AM By: [name] Shan DO Signed: 12/31/2020 6:02:39 PM By: [name] Entered By: [name] on 12/31/2020 11:11:07 -------------------------------------------------------------------------------- SuperBill Details Patient Name: Date of Service: [name], [place] 12/31/2020 Medical Record Number: [id number] Patient Account Number: [id number] Date of Birth/Sex: Treating RN: 05-08-1976 (45 y.o. [name], [name] Primary Care Provider: Cristie [name] Other Clinician: Referring Provider: Treating Provider/Extender: [name] in Treatment: 24 Diagnosis Coding ICD-10 Codes Code Description [phone] Pressure ulcer of other site, stage 3 M62.81 Muscle weakness (generalized) I10 Essential (primary) hypertension L97.512 Non-pressure chronic ulcer of other part of right foot with fat layer exposed L97.522 Non-pressure chronic ulcer of other 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 Facility Procedures CPT4 Code: [id number] 1 Description: [place] - DEB SUBQ TISSUE 20 SQ CM/< ICD-10 Diagnosis Description L97.512 Non-pressure chronic ulcer of other part of right foot with fat layer exposed L97.522 Non-pressure chronic ulcer of other 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 Modifier: Quantity: 1 Physician Procedures : CPT4 Code Description Modifier [id number] - WC PHYS SUBQ TISS 20 SQ CM ICD-10 Diagnosis Description L97.512 Non-pressure chronic ulcer of other part of right foot with fat layer exposed L97.522 Non-pressure chronic ulcer of other part of left foot  with fat layer exposed Quantity: 1 Electronic Signature(s) Signed: 12/31/2020 11:40:38 AM By: [name] Shan DO Signed: 12/31/2020 11:40:38 AM By: [name] Shan DO Entered By: [name] on 12/31/2020  11:40:03

## 2021-01-01 ENCOUNTER — Encounter: Payer: Self-pay | Admitting: Physical Medicine and Rehabilitation

## 2021-01-01 ENCOUNTER — Other Ambulatory Visit: Payer: Self-pay

## 2021-01-01 ENCOUNTER — Encounter
Payer: BC Managed Care – PPO | Attending: Physical Medicine and Rehabilitation | Admitting: Physical Medicine and Rehabilitation

## 2021-01-01 VITALS — BP 145/85 | HR 78 | Temp 98.8°F | Ht 68.5 in | Wt 314.0 lb

## 2021-01-01 DIAGNOSIS — G894 Chronic pain syndrome: Secondary | ICD-10-CM

## 2021-01-01 DIAGNOSIS — Z5181 Encounter for therapeutic drug level monitoring: Secondary | ICD-10-CM

## 2021-01-01 DIAGNOSIS — G7281 Critical illness myopathy: Secondary | ICD-10-CM | POA: Diagnosis not present

## 2021-01-01 DIAGNOSIS — Z79899 Other long term (current) drug therapy: Secondary | ICD-10-CM | POA: Diagnosis not present

## 2021-01-01 DIAGNOSIS — U099 Post covid-19 condition, unspecified: Secondary | ICD-10-CM

## 2021-01-01 DIAGNOSIS — F329 Major depressive disorder, single episode, unspecified: Secondary | ICD-10-CM

## 2021-01-01 MED ORDER — HYDROCODONE-ACETAMINOPHEN 5-325 MG PO TABS: 1.0000 | ORAL_TABLET | Freq: Four times a day (QID) | ORAL | 0 refills | Status: DC | PRN

## 2021-01-01 NOTE — Progress Notes (Signed)
Subjective:    Patient ID: [name], male    DOB: Apr 09, 1976, 45 y.o.   MRN: [id number]  HPI   Pt is a 45 yr old male with COVID ICU myopathy,Long COVID,s/p surgery on feet- skin grafts and R foot osteomyelitis as well.  Also has L foot drop.   He's here for f/u on ICU myopathy/long COVID and foot drop.    In contact cast- 1x/week for LLE/ulcers. TO wear until it's healed.   Takes 90% of pressure off bottom of foot.  Makes his knee hurt some.   In walking boot on L and walking Darco shoe on R side.   Is driving some- but when they put the contact cast on the R, will not be able to drive at that point.   Hasn't taken any recent pics on phone of wounds.    Has gained weight since in w/c all the time.  Cannot put weight on either foot.   Same old thing with feet- hard to sleep with contact cast on LLE- started 2 weeks ago.   Reduced Duloxetine to 60 mg daily- because of fogginess with it. Zombied out a little- maybe a little better- decreased it last 1-2 weeks.   Also weight at last visit- last visit was 306 lbs- and now 303 lbs. BMI 45.4   Stopped doing chair exercises again, per wife.  Got depressed when contact cast put on.  Plans to go to PT after wounds healed.  Hx of skin grafts to feet- 03/27/20.  Done several different applications of oasis.  Hasn't done hyperbaric O2.  Now doing hydrophera blue.    Needs assistance to get into and out of car.   Now cannot do exercise pedals due to new cast.    Doing Juven- not covered by insurance- supposed to do 2x/day.  Has tried Juven.  Doesn't taste too bad, but VERy expensive- hasn't bought any yet.    Pain- about the same- a little more when walking/moving around.   Hasn't taken Oxy or Norco at least 1 week.  The contact cast has helped to not take pain meds.  However cast causes L knee pain.   Last dose of Oxy was last Friday- and taking 650 mg tylenol most of time- out of both Oxy and Norco.     Still taking Protonix- 40 mg daily- for GERD- helps prevent ulcers and reflux.   More depressed since got contact cast.      Pain Inventory Average Pain 4 Pain Right Now 4 My pain is intermittent, constant, sharp and burning  LOCATION OF PAIN  Feet & toes  BOWEL Number of stools per week: 4-5 Oral laxative use No  Type of laxative none Enema or suppository use No  History of colostomy No  Incontinent No   BLADDER Normal In and out cath, frequency N/A Able to self cath No  Bladder incontinence No  Frequent urination No  Leakage with coughing No  Difficulty starting stream sometimes Incomplete bladder emptying No    Mobility use a walker how many minutes can you walk? 3 mins ability to climb steps?  yes do you drive?  yes use a wheelchair transfers alone Do you have any goals in this area?  yes  Function disabled: date disabled Working on process I need assistance with the following:  bathing, meal prep, household duties and shopping  Neuro/Psych trouble walking depression anxiety  Prior Studies Any changes since last visit?  no  Physicians  involved in your care Any changes since last visit?  no   Family History  Problem Relation Age of Onset  . Asthma Brother   . Diabetes Father   . Hypertension Father   . Diabetes Paternal Grandmother   . Hypertension Paternal Grandmother   . Migraines Mother   . GER disease Mother   . Pancreatic cancer Maternal Grandmother   . Heart disease Maternal Grandfather    Social History   Socioeconomic History  . Marital status: Married    Spouse name: Not on file  . Number of children: 1  . Years of education: Not on file  . Highest education level: Not on file  Occupational History  . Occupation: delivery driver  Tobacco Use  . Smoking status: Never Smoker  . Smokeless tobacco: Never Used  Vaping Use  . Vaping Use: Never used  Substance and Sexual Activity  . Alcohol use: Yes    Comment: rarely  2-3 times a year  . Drug use: No  . Sexual activity: Yes    Partners: Female  Other Topics Concern  . Not on file  Social History Narrative  . Not on file   Social Determinants of Health   Financial Resource Strain: Not on file  Food Insecurity: Not on file  Transportation Needs: Not on file  Physical Activity: Not on file  Stress: Not on file  Social Connections: Not on file   Past Surgical History:  Procedure Laterality Date  . CANNULATION FOR ECMO (EXTRACORPOREAL MEMBRANE OXYGENATION) N/A 08/28/2019   Procedure: CANNULATION FOR VV ECMO (EXTRACORPOREAL MEMBRANE OXYGENATION);  Surgeon: Prescott Gum, Collier Salina, MD;  Location: [facility];  Service: Open Heart Surgery;  Laterality: N/A;  CRESCENT CANNULA  . CANNULATION FOR ECMO (EXTRACORPOREAL MEMBRANE OXYGENATION) N/A 09/10/2019   Procedure: CANNULATION FOR ECMO (EXTRACORPOREAL MEMBRANE OXYGENATION) PUTTING IN CRESCENT 32FR CANNULA  AND REMOVING GROING CANNULATION;  Surgeon: Wonda Olds, MD;  Location: [facility];  Service: Open Heart Surgery;  Laterality: N/A;  PUTTING IN CRESCENT/REMOVING GROIN CANNULATION  . CYSTOSCOPY/URETEROSCOPY/HOLMIUM LASER/STENT PLACEMENT Left 04/18/2019   Procedure: LEFT URETEROSCOPY/HOLMIUM LASER/STENT PLACEMENT;  Surgeon: Ardis Hughs, MD;  Location: [facility];  Service: Urology;  Laterality: Left;  . CYSTOSCOPY/URETEROSCOPY/HOLMIUM LASER/STENT PLACEMENT Left 05/02/2019   Procedure: CYSTOSCOPY/URETEROSCOPY/HOLMIUM LASER/STENT EXCHANGE;  Surgeon: Ardis Hughs, MD;  Location: [facility];  Service: Urology;  Laterality: Left;  . ECMO CANNULATION N/A 08/03/2019   Procedure: ECMO CANNULATION;  Surgeon: Wonda Olds, MD;  Location: [facility];  Service: Cardiovascular;  Laterality: N/A;  . ESOPHAGOGASTRODUODENOSCOPY N/A 09/11/2019   Procedure: ESOPHAGOGASTRODUODENOSCOPY (EGD);  Surgeon: Wonda Olds, MD;  Location[facility];  Service: Thoracic;  Laterality: N/A;  . GRAFT APPLICATION  Bilateral 2/99/2426   Procedure: APPLICATION OF SKIN GRAFT BILATERAL FEET;  Surgeon: Edrick Kins, DPM;  Location: [facility];  Service: Podiatry;  Laterality: Bilateral;  . IR REPLC GASTRO/COLONIC TUBE PERCUT W/FLUORO  10/14/2019  . IRRIGATION AND DEBRIDEMENT SHOULDER Right 09/29/2017   Procedure: IRRIGATION AND DEBRIDEMENT SHOULDER;  Surgeon: Leandrew Koyanagi, MD;  Location: [place];  Service: Orthopedics;  Laterality: Right;  . [place] SURGERY  2002  . NASAL ENDOSCOPY WITH EPISTAXIS CONTROL N/A 08/31/2019   Procedure: NASAL ENDOSCOPY WITH EPISTAXIS CONTROL WITH CAUTERIZATION;  Surgeon: Melida Quitter, MD;  Location: [facility];  Service: ENT;  Laterality: N/A;  . PORTACATH PLACEMENT N/A 09/11/2019   Procedure: PEG TUBE INSERTION - BEDSIDE;  Surgeon: Wonda Olds, MD;  Location: [name];  Service: Thoracic;  Laterality: N/A;  . TEE WITHOUT CARDIOVERSION N/A 08/28/2019   Procedure: TRANSESOPHAGEAL ECHOCARDIOGRAM (TEE);  Surgeon: Prescott Gum, Collier Salina, MD;  Location: [name];  Service: Open Heart Surgery;  Laterality: N/A;  . TEE WITHOUT CARDIOVERSION N/A 09/10/2019   Procedure: TRANSESOPHAGEAL ECHOCARDIOGRAM (TEE);  Surgeon: Wonda Olds, MD;  Location: [place] [facility];  Service: Open Heart Surgery;  Laterality: N/A;  . WOUND DEBRIDEMENT Bilateral 03/27/2020   Procedure: EXCISIONAL DEBRIDEMENT OF ULCERS BILATERAL FEET;  Surgeon: Edrick Kins, DPM;  Location: [facility];  Service: Podiatry;  Laterality: Bilateral;   Past Medical History:  Diagnosis Date  . Anginal pain ([place])    with covid  . Anxiety   . Asthma   . Dyspnea   . GERD (gastroesophageal reflux disease)   . Headache   . History of kidney stones    LEFT URETERAL STONE  . HTN (hypertension)   . Pancreatitis 2018   GALLBALDDER SLUDGE CAUSED ISSUED RESOLVED  . Pneumonia 07/2019   covid   BP (!) 151/81   Pulse 78   Temp 98.8 F (37.1 C)   Ht 5' 8.5" (1.74 m)   Wt (!) 303 lb (137.4 kg)   BMI 45.40 kg/m   Opioid Risk Score:   Fall Risk Score:   `1  Depression screen PHQ 2/9  Depression screen [facility] 2/9 01/01/2021 11/04/2020 10/15/2020 09/09/2020 07/01/2020 04/15/2020 01/03/2020  Decreased Interest 0 1 0 1 0 - 0  Down, Depressed, Hopeless 0 1 1 1  0 - 1  PHQ - 2 Score 0 2 1 2  0 - 1  Altered sleeping - - - - - - 1  Tired, decreased energy - - - - - - 2  Change in appetite - - - - - 1 0  Feeling bad or failure about yourself  - - - - - 1 1  Trouble concentrating - - - - - - 0  Moving slowly or fidgety/restless - - - - - - 1  Suicidal thoughts - - - - - - 0  PHQ-9 Score - - - - - - 6  Difficult doing work/chores - - - - - - Somewhat difficult  Some recent data might be hidden   Review of Systems  Constitutional: Positive for unexpected weight change.  HENT: Positive for sinus pain.   Respiratory: Positive for shortness of breath.   Genitourinary: Positive for difficulty urinating.  Musculoskeletal: Positive for back pain and gait problem.  Neurological: Positive for numbness.  Psychiatric/Behavioral:       Depression  All other systems reviewed and are negative.      Objective:   Physical Exam  Looked at pics of L heel- triangular wound- in middle of heel- granulating well; Also R foot has a rectangular wound on lateral aspect of R foot.  L foot /ankle/leg in contact hard cast- cannot check L foot drop.  R ankle DF and PF 5/5 now. L foot cannot test due to cast.      Assessment & Plan:     Pt is a 46 yr old male with [place] ICU myopathy,Long COVID,s/p surgery on feet- skin grafts and R foot osteomyelitis as well.  Also has L foot drop.   He's here for f/u on ICU myopathy/long COVID and foot drop.    1. Suggest Hyperbaric Oxygen because it's literally been 10 months since skin grafts- transportation could be an issue, but pt/wife willing to do it for short period of time.  2. Will refill Norco for 30 tabs, not 60 tabs- just as needed- suggest not putting in regular pill box, but get a pill/caddy" and put in pocket  when leaves house to take as needed-  3. Decreased Duloxetine since was foggy= will con't Duloxetine 60 mg daily.   4. Refer to Dr Velta Addison- neuropsychology- [phone]- to make appointment if someone hasn't called by next week.- I truly think this could be helpful to deal with all the mood issues from severe medical issues.   5. Con't tylenol for lower level pain- use first.  Make sure the total tylenol in 1 day is less than 2.5grams/2550m in 1 day. So 3 tylenol/day and 1 norco or whatever combo you needed.   6. We went over need for L AFO- in L ankle- after can put weight  And walk with L foot.   7. Went over ICU myopathy- vs debility- explained basically- shock of body- ICU myopathy vs lack of use-debility.  8. Explained how to get better and also explained that can improve strength everywhere else.   9. F/U 2 months double appointment  I spent a total of 40 minutes on visit- discussed ICU myopathy- and debility and working on ways ot deal with depression.

## 2021-01-01 NOTE — Patient Instructions (Signed)
Pt is a 45 yr old male with [place] ICU myopathy,Long COVID,s/p surgery on feet- skin grafts and R foot osteomyelitis as well.  Also has L foot drop.   He's here for f/u on ICU myopathy/long COVID and foot drop.    1. Suggest Hyperbaric Oxygen because it's literally been 10 months since skin grafts- transportation could be an issue, but pt/wife willing to do it for short period of time.   2. Will refill Norco for 30 tabs, not 60 tabs- just as needed- suggest not putting in regular pill box, but get a pill/caddy" and put in pocket when leaves house to take as needed-  3. Decreased Duloxetine since was foggy= will con't Duloxetine 60 mg daily.   4. Refer to Dr Velta Addison- neuropsychology- [phone]- to make appointment if someone hasn't called by next week.- I truly think this could be helpful to deal with all the mood issues from severe medical issues.   5. Con't tylenol for lower level pain- use first.  Make sure the total tylenol in 1 day is less than 2.5grams/2560m in 1 day. So 3 tylenol/day and 1 norco or whatever combo you needed.   6. We went over need for L AFO- in L ankle- after can put weight  And walk with L foot.   7. Went over ICU myopathy- vs debility- explained basically- shock of body- ICU myopathy vs lack of use-debility.  8. Explained how to get better and also explained that can improve strength everywhere else.   9. F/U 2 months double appointment

## 2021-01-07 ENCOUNTER — Encounter (HOSPITAL_BASED_OUTPATIENT_CLINIC_OR_DEPARTMENT_OTHER): Payer: BC Managed Care – PPO | Attending: Internal Medicine | Admitting: Internal Medicine

## 2021-01-07 ENCOUNTER — Other Ambulatory Visit: Payer: Self-pay

## 2021-01-07 DIAGNOSIS — L97522 Non-pressure chronic ulcer of other 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: Secondary | ICD-10-CM | POA: Insufficient documentation

## 2021-01-07 DIAGNOSIS — U071 COVID-19: Secondary | ICD-10-CM | POA: Diagnosis not present

## 2021-01-07 DIAGNOSIS — L97512 Non-pressure chronic ulcer of other part of right foot with fat layer exposed: Secondary | ICD-10-CM | POA: Insufficient documentation

## 2021-01-07 DIAGNOSIS — G4734 Idiopathic sleep related nonobstructive alveolar hypoventilation: Secondary | ICD-10-CM | POA: Diagnosis not present

## 2021-01-07 DIAGNOSIS — L89620 Pressure ulcer of left heel, unstageable: Secondary | ICD-10-CM | POA: Diagnosis not present

## 2021-01-07 DIAGNOSIS — L89893 Pressure ulcer of other site, stage 3: Secondary | ICD-10-CM | POA: Diagnosis not present

## 2021-01-07 NOTE — Progress Notes (Signed)
[name] ([id number]) Visit Report for 01/07/2021 HPI Details Patient Name: Date of Service: [name], [place] 01/07/2021 10:30 A M Medical Record Number: [id number] Patient Account Number: [id number] Date of Birth/Sex: Treating RN: [birth year] (45 y.o. M) Primary Care Provider: Cristie [name] Other Clinician: Referring Provider: Treating Provider/Extender: [name] in Treatment: 25 History of Present Illness HPI Description: 07/15/2020 upon evaluation today patient presents for initial inspection here in our clinic concerning issues he has been having with the bottoms of his feet bilaterally. He states these actually occurred as wounds when he was hospitalized for 5 months secondary to Covid. He was apparently with tilting bed where he was in an upright position quite frequently and apparently this occurred in some way shape or form during that time. Fortunately there is no sign of active infection at this time. No fevers, chills, nausea, vomiting, or diarrhea. With that being said he still has substantial wounds on the plantar aspects of his feet Theragen require quite a bit of work to get these to heal. He has been using Santyl currently though that is been problematic both in receiving the medication as well as actually paid for it as it is become quite expensive. Prior to the experience with Covid the patient really did not have any major medical problems other than hypertension he does have some mild generalized weakness following the Covid experience. 07/22/2020 on evaluation today patient appears to be doing okay in regard to his foot ulcers I feel like the wound beds are showing signs of better improvement that I do believe the Iodoflex is helping in this regard. With that being said he does have a lot of drainage currently and this is somewhat blue/green in nature which is consistent with Pseudomonas. I do think a culture today would be appropriate for [place] to  evaluate and see if that is indeed the case I would likely start him on antibiotic orally as well he is not allergic to Cipro knows of no issues he has had in the past 12/21; patient was admitted to the clinic earlier this month with bilateral presumed pressure ulcers on the bottom of his feet apparently related to excessive pressure from a tilt table arrangement in the intensive care unit. Patient relates this to being on ECMO but I am not really sure that is exactly related to that. I must say I have never seen anything like this. He has fairly extensive full-thickness wounds extending from his heel towards his midfoot mostly centered laterally. There is already been some healing distally. He does not appear to have an arterial issue. He has been using gentamicin to the wound surfaces with Iodoflex to help with ongoing debridement 1/6; this is a patient with pressure ulcers on the bottom of his feet related to excessive pressure from a standing position in the intensive care unit. He is complaining of a lot of pain in the right heel. He is not a diabetic. 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 We have been using Iodoflex to help prepare the surfaces of both wounds for an advanced treatment product. He is nonambulatory spending most of his time in a wheelchair I have asked him not to propel the wheelchair with his heels 1/13; in general his wounds look better not much surface area change we have been using Iodoflex as of last week. I did an x-ray of the right heel as the patient was complaining of pain. I had some  thoughts about a stress fracture perhaps Achilles tendon problems however what it showed was erosive changes along the inferior aspect of the calcaneus he now has a MRI booked for 1/20. 1/20; in general his wounds continue to be better. Some improvement in the large narrow areas proximally in his foot. He is still complaining of pain in the right heel and  tenderness in certain areas of this wound. His MRI is tonight. I am not just looking for osteomyelitis that was brought up on the x-ray I am wondering about stress fractures, tendon ruptures etc. He has no such findings on the left. Also noteworthy is that the patient had critical illness neuropathy and some of the discomfort may be actual improvement in nerve function I am just not sure. These wounds were initially in the setting of severe critical illness related to COVID-19. He was put in a standing position. He may have also been on pressors at the point contributing to tissue ischemia. By his description at some point these wounds were grossly necrotic extending proximally up into the Achilles part of his heel. I do not know that I have ever really seen pictures of them like this although they may exist in epic We have ordered Tri layer Oasis. I am trying to stimulate some granulation in these areas. This is of course assuming the MRI is negative for infection 1/27; since the patient was last here he saw Dr. Juleen [name] of infectious disease. He is planned for vancomycin and ceftriaxone. Prior operative culture grew MSSA. Also ordered baseline lab work. He also ordered arterial studies although the ABIs in our clinic were normal as well as his clinical exam these were normal I do not think he needs to see vascular surgery. His ABIs at the PTA were 1.22 in the right triphasic waveforms with a normal TBI of 1.15 on the left ABI of 1.22 with triphasic waveforms and a normal TBI of 1.08. Finally he saw Dr. Amalia [name] who will follow him in for 2 months. At this point I do not think he felt that he needed a procedure on the right calcaneal bone. Dr. Juleen [name] is elected for broad-spectrum antibiotic The patient is still having pain in the right heel. He walks with a walker 2/3; wounds are generally smaller. He is tolerating his IV antibiotics. I believe this is vancomycin and ceftriaxone. We are still waiting  for Oasis burn in terms of his out-of-pocket max which he should be meeting soon given the IV antibiotics, MRIs etc. I have asked him to check in on this. We are using silver collagen in the meantime the wounds look better 2/10; tolerating IV vancomycin and Rocephin. We are waiting to apply for Oasis. Although I am not really sure where he is in his out-of-pocket max. 2/17 started the first application of Oasis trilayer. Still on antibiotics. The wounds have generally look better. The area on the left has a little more surface slough requiring debridement 5/46; second application of Oasis trilayer. The wound surface granulation is generally look better. The area on the left with undermining laterally I think is come in a bit. 10/08/2020 upon evaluation today patient is here today for [employer] application #3. Fortunately he seems to be doing extremely well with regard to this and we are seeing a lot of new epithelial growth which is great news. Fortunately there is no signs of active infection at this time. 10/16/2020 upon evaluation today patient appears to be doing well with regard to his  foot ulcers. Do believe the Oasis has been of benefit for him. I do not see any signs of infection right now which is great news and I think that he has a lot of new epithelial growth which is great to see as well. The patient is very pleased to hear all of this. I do think we can proceed with the Oasis trilayer #4 today. 3/18; not as much improvement in these areas on his heels that I was hoping. I did reapply trilateral Oasis today the tissue looks healthier but not as much fill in as I was hoping. 3/25; better looking today I think this is come in a bit the tissue looks healthier. Triple layer Oasis reapplied #6 4/1; somewhat better looking definitely better looking surface not as much change in surface area as I was hoping. He may be spending more time [place] on days then he needs to although he does have  heel offloading boots. Triple layer Oasis reapplied #7 4/7; unfortunately apparently [facility] will not approve any further Oasis which is unfortunate since the patient did respond nicely both in terms of the condition of the wound bed as well as surface area. There is still some drainage coming from the wound but not a lot there does not appear to be any infection 4/15; we have been using Hydrofera Blue. He continues to have nice rims of epithelialization on the right greater than the left. The left the epithelialization is coming from the tip of his heel. There is moderate drainage. In this that concerns me about a total contact cast. There is no evidence of infection 4/29; patient has been using Hydrofera Blue with dressing changes. He has no complaints or issues today. 5/5; using Hydrofera Blue. I actually think that he looks marginally better than the last time I saw this 3 weeks ago. There are rims of epithelialization on the left thumb coming from the medial side on the right. Using Hydrofera Blue 5/12; using Hydrofera Blue. These continue to make improvements in surface area. His drainage was not listed as severe I therefore went ahead and put a cast on the left foot. Right foot we will continue to dress his previous 5/16; back for first total contact cast change. He did not tolerate this particularly well cast injury on the anterior tibia among other issues. Difficulty sleeping. I talked him about this in some detail and afterwards is elected to continue. I told him I would like to have a cast on for 3 weeks to see if this is going to help at all. I think he agreed 5/19; I think the wound is better. There is no tunneling towards his midfoot. The undermining medially also looks better. He has a rim of new skin distally. I think we are making progress here. The area on the left also continues to look somewhat better to me using Hydrofera Blue. He has a list of complaints about  the cast but none of them seem serious 5/26; patient presents for 1 week follow-up. He has been using a total contact cast and tolerating this well. Hydrofera Blue is the main dressing used. He denies signs of infection. 6/2 Hydrofera Blue total contact cast on the left. These were large ulcers that formed in intensive care unit where the patient was recovering from [place]. May have had something to do with being ventilated in an upright positiono Pressors etc. We have been able to get the areas down considerably and a viable surface.  There is some epithelialization in both sides. Note made of drainage Electronic Signature(s) Signed: 01/07/2021 4:50:31 PM By: Linton Ham MD Entered By: Linton Ham on 01/07/2021 12:14:26 -------------------------------------------------------------------------------- Physical Exam Details Patient Name: Date of Service: [name], [place] 01/07/2021 10:30 A M Medical Record Number: [id number] Patient Account Number: [id number] Date of Birth/Sex: Treating RN: Oct 11, 1975 (45 y.o. M) Primary Care Provider: Cristie [name] Other Clinician: Referring Provider: Treating Provider/Extender: [name] in Treatment: 25 Constitutional Patient is hypertensive.. Pulse regular and within target range for patient.Marland Kitchen Respirations regular, non-labored and within target range.. Temperature is normal and within the target range for the patient.Marland Kitchen Appears in no distress. Notes Wound exam; bilateral heel wounds. Surface of the wounds does not look too bad. No debridement. The right foot still has undermining towards the mid part of the foot that has not completely closed the left area is now closed from the mid part of his foot. In spite of the history of drainage I see no evidence of infection Electronic Signature(s) Signed: 01/07/2021 4:50:31 PM By: Linton Ham MD Entered By: Linton Ham on 01/07/2021  12:20:57 -------------------------------------------------------------------------------- Physician Orders Details Patient Name: Date of Service: [name], [place] 01/07/2021 10:30 A M Medical Record Number: [id number] Patient Account Number: [id number] Date of Birth/Sex: Treating RN: September 21, 1975 (45 y.o. 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 - Dr. Dellia Nims Thursday for [facility] appt. ****60 minutes for cast changes**** Return A Bathing/ Shower/ Hygiene May shower with protection but do not get wound dressing(s) wet. Edema Control - Lymphedema / SCD / Other Bilateral Lower Extremities Elevate legs to the level of the heart or above for 30 minutes daily and/or when sitting, a frequency of: Avoid standing for long periods of time. Off-Loading Total Contact Cast to Left Lower Extremity Wedge shoe to: - Glophed Shoe to right foot Other: - keep pressure off of the bottom of your feet Additional Orders / Instructions Follow Nutritious Diet Wound Treatment Wound #1 - Calcaneus Wound Laterality: Right Cleanser: Normal Saline (Generic) Every Other Day/15 Days Discharge Instructions: Cleanse the wound with Normal Saline prior to applying a clean dressing using gauze sponges, not tissue or cotton balls. Cleanser: Wound Cleanser Every Other Day/15 Days Discharge Instructions: Cleanse the wound with wound cleanser prior to applying a clean dressing using gauze sponges,  not tissue or cotton balls. Prim Dressing: KerraCel Ag Gelling Fiber Dressing, 2x2 in (silver alginate) (DME) (Generic) Every Other Day/15 Days ary Discharge Instructions: ****Ensure to apply into divot area of wound.**** Apply silver alginate to wound bed as instructed Secondary Dressing: ABD Pad, 5x9 Every Other Day/15 Days Discharge Instructions: Apply over primary dressing as directed. Secured With: Elastic Bandage 4 inch (ACE bandage) Every Other Day/15 Days Discharge Instructions: Secure with ACE bandage as directed. Secured With: [employer], 4.5x3.1 (in/yd) (Generic) Every Other Day/15 Days Discharge Instructions: Secure with Kerlix as directed. Wound #2 - Calcaneus Wound Laterality: Left Cleanser: Normal Saline (Generic) Every Other Day/15 Days Discharge Instructions: Cleanse the wound with Normal Saline prior to applying  a clean dressing using gauze sponges, not tissue or cotton balls. Cleanser: Wound Cleanser Every Other Day/15 Days Discharge Instructions: Cleanse the wound with wound cleanser prior to applying a clean dressing using gauze sponges, not tissue or cotton balls. Peri-Wound Care: Zinc Oxide Ointment 30g tube Every Other Day/15 Days Discharge Instructions: ****Apply Zinc Oxide to periwound with each dressing change**** Prim Dressing: KerraCel Ag Gelling Fiber Dressing, 2x2 in (silver alginate) Every Other Day/15 Days ary Discharge Instructions: Apply silver alginate to wound bed as instructed Secondary Dressing: Woven Gauze Sponge, Non-Sterile 4x4 in (Generic) Every Other Day/15 Days Discharge Instructions: Apply over primary dressing as directed. Secondary Dressing: ABD Pad, 8x10 (Generic) Every Other Day/15 Days Discharge Instructions: Apply over primary dressing as directed. Secondary Dressing: Zetuvit Plus 4x8 in (Generic) Every Other Day/15 Days Discharge Instructions: Apply over primary dressing as directed. Secured With: [employer], 4.5x3.1  (in/yd) (Generic) Every Other Day/15 Days Discharge Instructions: Secure with Kerlix as directed. Electronic Signature(s) Signed: 01/07/2021 4:50:31 PM By: Linton Ham MD Signed: 01/07/2021 5:16:10 PM By: [name] Entered By: [name] on 01/07/2021 11:16:03 -------------------------------------------------------------------------------- Problem List Details Patient Name: Date of Service: [name], [place] 01/07/2021 10:30 A M Medical Record Number: [id number] Patient Account Number: [id number] Date of Birth/Sex: Treating RN: 07-31-1976 (45 y.o. 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 09/03/2020 No Yes L97.522 Non-pressure chronic ulcer of other 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 09/03/2020 No Yes Inactive Problems ICD-10 Code Description Active Date Inactive Date M86.171 Other acute osteomyelitis, right ankle and foot 09/03/2020 09/03/2020 Resolved Problems Electronic Signature(s) Signed: 01/07/2021 4:50:31 PM By: Linton Ham MD Entered By: Linton Ham on 01/07/2021 12:12:30 -------------------------------------------------------------------------------- Progress Note Details Patient Name: Date of Service: [name], [place] 01/07/2021 10:30 A M Medical Record Number: [id number] Patient Account Number: [id number] Date of Birth/Sex: Treating RN: 10-31-1975 (45 y.o. M) Primary Care Provider: Cristie [name] Other Clinician: Referring Provider: Treating Provider/Extender: [name] in  Treatment: 25 Subjective History of Present Illness (HPI) 07/15/2020 upon evaluation today patient presents for initial inspection here in our clinic concerning issues he has been having with the bottoms of his feet bilaterally. He states these actually occurred as wounds when he was hospitalized for 5 months secondary to Covid. He was apparently with tilting bed where he was in an upright position quite frequently and apparently this occurred in some way shape or form during that time. Fortunately there is no sign of active infection at this time. No fevers, chills, nausea, vomiting, or diarrhea. With that being said he still has substantial wounds on the plantar aspects of his feet Theragen require quite a bit of work to get these to heal. He has been using Santyl currently though that is been problematic both in receiving the medication as well as actually paid for it as it is become quite expensive. Prior to the experience with Covid the patient really did not have any major medical problems other than hypertension he does have some mild generalized weakness following the Covid experience. 07/22/2020 on evaluation today patient appears to be doing okay  in regard to his foot ulcers I feel like the wound beds are showing signs of better improvement that I do believe the Iodoflex is helping in this regard. With that being said he does have a lot of drainage currently and this is somewhat blue/green in nature which is consistent with Pseudomonas. I do think a culture today would be appropriate for [place] to evaluate and see if that is indeed the case I would likely start him on antibiotic orally as well he is not allergic to Cipro knows of no issues he has had in the past 12/21; patient was admitted to the clinic earlier this month with bilateral presumed pressure ulcers on the bottom of his feet apparently related to excessive pressure from a tilt table arrangement in the intensive care unit. Patient  relates this to being on ECMO but I am not really sure that is exactly related to that. I must say I have never seen anything like this. He has fairly extensive full-thickness wounds extending from his heel towards his midfoot mostly centered laterally. There is already been some healing distally. He does not appear to have an arterial issue. He has been using gentamicin to the wound surfaces with Iodoflex to help with ongoing debridement 1/6; this is a patient with pressure ulcers on the bottom of his feet related to excessive pressure from a standing position in the intensive care unit. He is complaining of a lot of pain in the right heel. He is not a diabetic. 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 We have been using Iodoflex to help prepare the surfaces of both wounds for an advanced treatment product. He is nonambulatory spending most of his time in a wheelchair I have asked him not to propel the wheelchair with his heels 1/13; in general his wounds look better not much surface area change we have been using Iodoflex as of last week. I did an x-ray of the right heel as the patient was complaining of pain. I had some thoughts about a stress fracture perhaps Achilles tendon problems however what it showed was erosive changes along the inferior aspect of the calcaneus he now has a MRI booked for 1/20. 1/20; in general his wounds continue to be better. Some improvement in the large narrow areas proximally in his foot. He is still complaining of pain in the right heel and tenderness in certain areas of this wound. His MRI is tonight. I am not just looking for osteomyelitis that was brought up on the x-ray I am wondering about stress fractures, tendon ruptures etc. He has no such findings on the left. Also noteworthy is that the patient had critical illness neuropathy and some of the discomfort may be actual improvement in nerve function I am just not sure. These wounds were  initially in the setting of severe critical illness related to COVID-19. He was put in a standing position. He may have also been on pressors at the point contributing to tissue ischemia. By his description at some point these wounds were grossly necrotic extending proximally up into the Achilles part of his heel. I do not know that I have ever really seen pictures of them like this although they may exist in epic We have ordered Tri layer Oasis. I am trying to stimulate some granulation in these areas. This is of course assuming the MRI is negative for infection 1/27; since the patient was last here he saw Dr. Juleen [name] of infectious disease. He is  planned for vancomycin and ceftriaxone. Prior operative culture grew MSSA. Also ordered baseline lab work. He also ordered arterial studies although the ABIs in our clinic were normal as well as his clinical exam these were normal I do not think he needs to see vascular surgery. His ABIs at the PTA were 1.22 in the right triphasic waveforms with a normal TBI of 1.15 on the left ABI of 1.22 with triphasic waveforms and a normal TBI of 1.08. Finally he saw Dr. Amalia [name] who will follow him in for 2 months. At this point I do not think he felt that he needed a procedure on the right calcaneal bone. Dr. Juleen [name] is elected for broad-spectrum antibiotic The patient is still having pain in the right heel. He walks with a walker 2/3; wounds are generally smaller. He is tolerating his IV antibiotics. I believe this is vancomycin and ceftriaxone. We are still waiting for Oasis burn in terms of his out-of-pocket max which he should be meeting soon given the IV antibiotics, MRIs etc. I have asked him to check in on this. We are using silver collagen in the meantime the wounds look better 2/10; tolerating IV vancomycin and Rocephin. We are waiting to apply for Oasis. Although I am not really sure where he is in his out-of-pocket max. 2/17 started the first application of  Oasis trilayer. Still on antibiotics. The wounds have generally look better. The area on the left has a little more surface slough requiring debridement 6/38; second application of Oasis trilayer. The wound surface granulation is generally look better. The area on the left with undermining laterally I think is come in a bit. 10/08/2020 upon evaluation today patient is here today for [employer] application #3. Fortunately he seems to be doing extremely well with regard to this and we are seeing a lot of new epithelial growth which is great news. Fortunately there is no signs of active infection at this time. 10/16/2020 upon evaluation today patient appears to be doing well with regard to his foot ulcers. Do believe the Oasis has been of benefit for him. I do not see any signs of infection right now which is great news and I think that he has a lot of new epithelial growth which is great to see as well. The patient is very pleased to hear all of this. I do think we can proceed with the Oasis trilayer #4 today. 3/18; not as much improvement in these areas on his heels that I was hoping. I did reapply trilateral Oasis today the tissue looks healthier but not as much fill in as I was hoping. 3/25; better looking today I think this is come in a bit the tissue looks healthier. Triple layer Oasis reapplied #6 4/1; somewhat better looking definitely better looking surface not as much change in surface area as I was hoping. He may be spending more time [place] on days then he needs to although he does have heel offloading boots. Triple layer Oasis reapplied #7 4/7; unfortunately apparently [facility] will not approve any further Oasis which is unfortunate since the patient did respond nicely both in terms of the condition of the wound bed as well as surface area. There is still some drainage coming from the wound but not a lot there does not appear to be any infection 4/15; we have been using  Hydrofera Blue. He continues to have nice rims of epithelialization on the right greater than the left. The left the epithelialization  is coming from the tip of his heel. There is moderate drainage. In this that concerns me about a total contact cast. There is no evidence of infection 4/29; patient has been using Hydrofera Blue with dressing changes. He has no complaints or issues today. 5/5; using Hydrofera Blue. I actually think that he looks marginally better than the last time I saw this 3 weeks ago. There are rims of epithelialization on the left thumb coming from the medial side on the right. Using Hydrofera Blue 5/12; using Hydrofera Blue. These continue to make improvements in surface area. His drainage was not listed as severe I therefore went ahead and put a cast on the left foot. Right foot we will continue to dress his previous 5/16; back for first total contact cast change. He did not tolerate this particularly well cast injury on the anterior tibia among other issues. Difficulty sleeping. I talked him about this in some detail and afterwards is elected to continue. I told him I would like to have a cast on for 3 weeks to see if this is going to help at all. I think he agreed 5/19; I think the wound is better. There is no tunneling towards his midfoot. The undermining medially also looks better. He has a rim of new skin distally. I think we are making progress here. The area on the left also continues to look somewhat better to me using Hydrofera Blue. He has a list of complaints about the cast but none of them seem serious 5/26; patient presents for 1 week follow-up. He has been using a total contact cast and tolerating this well. Hydrofera Blue is the main dressing used. He denies signs of infection. 6/2 Hydrofera Blue total contact cast on the left. These were large ulcers that formed in intensive care unit where the patient was recovering from [place]. May have had something to do with  being ventilated in an upright positiono Pressors etc. We have been able to get the areas down considerably and a viable surface. There is some epithelialization in both sides. Note made of drainage Objective Constitutional Patient is hypertensive.. Pulse regular and within target range for patient.Marland Kitchen Respirations regular, non-labored and within target range.. Temperature is normal and within the target range for the patient.Marland Kitchen Appears in no distress. Vitals Time Taken: 10:40 AM, Height: 69 in, Weight: 280 lbs, BMI: 41.3, Temperature: 98.7 F, Pulse: 91 bpm, Respiratory Rate: 18 breaths/min, Blood Pressure: 166/89 mmHg. General Notes: Wound exam; bilateral heel wounds. Surface of the wounds does not look too bad. No debridement. The right foot still has undermining towards the mid part of the foot that has not completely closed the left area is now closed from the mid part of his foot. In spite of the history of drainage I see no evidence of infection Integumentary (Hair, Skin) Wound #1 status is Open. Original cause of wound was Pressure Injury. The date acquired was: 10/07/2019. The wound has been in treatment 25 weeks. The wound is located on the Right Calcaneus. The wound measures 3cm length x 1cm width x 0.4cm depth; 2.356cm^2 area and 0.942cm^3 volume. There is Fat Layer (Subcutaneous Tissue) exposed. There is no tunneling or undermining noted. There is a medium amount of drainage noted. The wound margin is distinct with the outline attached to the wound base. There is large (67-100%) red, pink granulation within the wound bed. There is a small (1-33%) amount of necrotic tissue within the wound bed. Wound #2 status is Open. Original  cause of wound was Pressure Injury. The date acquired was: 10/07/2019. The wound has been in treatment 25 weeks. The wound is located on the Left Calcaneus. The wound measures 4.5cm length x 1cm width x 0.2cm depth; 3.534cm^2 area and 0.707cm^3 volume. There is  Fat Layer (Subcutaneous Tissue) exposed. There is no tunneling or undermining noted. There is a medium amount of serosanguineous drainage noted. The wound margin is distinct with the outline attached to the wound base. There is large (67-100%) pink granulation within the wound bed. There is a small (1-33%) amount of necrotic tissue within the wound bed including Adherent Slough. Assessment Active Problems ICD-10 Pressure ulcer of other site, stage 3 Muscle weakness (generalized) Essential (primary) hypertension Non-pressure chronic ulcer of other part of right foot with fat layer exposed Non-pressure chronic ulcer of other 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 Procedures Wound #2 Pre-procedure diagnosis of Wound #2 is a Pressure Ulcer located on the Left Calcaneus . There was a T Contact Cast Procedure by Deno Etienne MD. Post procedure Diagnosis Wound #2: Same as Pre-Procedure Plan Follow-up Appointments: Return Appointment in 1 week. - Dr. Dellia Nims Thursday for [facility] appt. ****60 minutes for cast changes**** Bathing/ Shower/ Hygiene: May shower with protection but do not get wound dressing(s) wet. Edema Control - Lymphedema / SCD / Other: Elevate legs to the level of the heart or above for 30 minutes daily and/or when sitting, a frequency of: Avoid standing for long periods of time. Off-Loading: T Contact Cast to Left Lower Extremity otal Wedge shoe to: - Glophed Shoe to right foot Other: - keep pressure off of the bottom of your feet Additional Orders / Instructions: Follow Nutritious Diet WOUND #1: - Calcaneus Wound Laterality: Right Cleanser: Normal Saline (Generic) Every Other Day/15 Days Discharge Instructions: Cleanse the wound with Normal Saline prior to applying a clean dressing using gauze sponges, not tissue or cotton balls. Cleanser: Wound Cleanser Every Other Day/15 Days Discharge Instructions: Cleanse the wound with wound cleanser prior to applying a  clean dressing using gauze sponges, not tissue or cotton balls. Prim Dressing: KerraCel Ag Gelling Fiber Dressing, 2x2 in (silver alginate) (DME) (Generic) Every Other Day/15 Days ary Discharge Instructions: ****Ensure to apply into divot area of wound.**** Apply silver alginate to wound bed as instructed Secondary Dressing: ABD Pad, 5x9 Every Other Day/15 Days Discharge Instructions: Apply over primary dressing as directed. Secured With: Elastic Bandage 4 inch (ACE bandage) Every Other Day/15 Days Discharge Instructions: Secure with ACE bandage as directed. Secured With: [employer], 4.5x3.1 (in/yd) (Generic) Every Other Day/15 Days Discharge Instructions: Secure with Kerlix as directed. WOUND #2: - Calcaneus Wound Laterality: Left Cleanser: Normal Saline (Generic) Every Other Day/15 Days Discharge Instructions: Cleanse the wound with Normal Saline prior to applying a clean dressing using gauze sponges, not tissue or cotton balls. Cleanser: Wound Cleanser Every Other Day/15 Days Discharge Instructions: Cleanse the wound with wound cleanser prior to applying a clean dressing using gauze sponges, not tissue or cotton balls. Peri-Wound Care: Zinc Oxide Ointment 30g tube Every Other Day/15 Days Discharge Instructions: ****Apply Zinc Oxide to periwound with each dressing change**** Prim Dressing: KerraCel Ag Gelling Fiber Dressing, 2x2 in (silver alginate) Every Other Day/15 Days ary Discharge Instructions: Apply silver alginate to wound bed as instructed Secondary Dressing: Woven Gauze Sponge, Non-Sterile 4x4 in (Generic) Every Other Day/15 Days Discharge Instructions: Apply over primary dressing as directed. Secondary Dressing: ABD Pad, 8x10 (Generic) Every Other Day/15 Days Discharge Instructions: Apply  over primary dressing as directed. Secondary Dressing: Zetuvit Plus 4x8 in (Generic) Every Other Day/15 Days Discharge Instructions: Apply over primary dressing as directed. Secured  With: [employer], 4.5x3.1 (in/yd) (Generic) Every Other Day/15 Days Discharge Instructions: Secure with Kerlix as directed. 1. I change the primary dressing to silver alginate largely because of the drainage. Backing ABD 2. Still casting the left foot 3. No evidence of infection. 4. The granulation looked reasonable to me today. Rims of epithelialization not too much greater than last time however Electronic Signature(s) Signed: 01/07/2021 4:50:31 PM By: Linton Ham MD Entered By: Linton Ham on 01/07/2021 12:22:29 -------------------------------------------------------------------------------- Total Contact Cast Details Patient Name: Date of Service: [name], [place] 01/07/2021 10:30 A M Medical Record Number: [id number] Patient Account Number: [id number] Date of Birth/Sex: Treating RN: March 15, 1976 (45 y.o. M) Primary Care Provider: Cristie [name] Other Clinician: Referring Provider: Treating Provider/Extender: [name] in Treatment: 25 T Contact Cast Applied for Wound Assessment: otal Wound #2 Left Calcaneus Performed By: Physician Ricard Dillon., MD Post Procedure Diagnosis Same as Pre-procedure Electronic Signature(s) Signed: 01/07/2021 4:50:31 PM By: Linton Ham MD Entered By: Linton Ham on 01/07/2021 12:13:12 -------------------------------------------------------------------------------- SuperBill Details Patient Name: Date of Service: [name], [place] 01/07/2021 Medical Record Number: [id number] Patient Account Number: [id number] Date of Birth/Sex: Treating RN: 1975-08-09 (46 y.o. [name], [name] Primary Care Provider: Cristie [name] Other Clinician: Referring Provider: Treating Provider/Extender: [name] in Treatment: 25 Diagnosis Coding ICD-10 Codes Code Description [phone] Pressure ulcer of other site, stage 3 M62.81 Muscle weakness (generalized) I10 Essential (primary)  hypertension L97.512 Non-pressure chronic ulcer of other part of right foot with fat layer exposed L97.522 Non-pressure chronic ulcer of other 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 Facility Procedures CPT4 Code: [id number] 2 Description: 9445 - APPLY TOTAL CONTACT LEG CAST ICD-10 Diagnosis Description L97.522 Non-pressure chronic ulcer of other 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 Modifier: Quantity: 1 Physician Procedures : CPT4 Code Description Modifier [id number] - WC PHYS APPLY TOTAL CONTACT CAST ICD-10 Diagnosis Description L97.522 Non-pressure chronic ulcer of other 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 Quantity: 1 Electronic Signature(s) Signed: 01/07/2021 4:50:31 PM By: Linton Ham MD Entered By: Linton Ham on 01/07/2021 12:22:50

## 2021-01-08 DIAGNOSIS — S91309A Unspecified open wound, unspecified foot, initial encounter: Secondary | ICD-10-CM | POA: Diagnosis not present

## 2021-01-08 LAB — DRUG TOX MONITOR 1 W/CONF, ORAL FLD

## 2021-01-08 LAB — DRUG TOX ALC METAB W/CON, ORAL FLD: Alcohol Metabolite: NEGATIVE ng/mL (ref <25)

## 2021-01-08 NOTE — Progress Notes (Signed)
[name], [name] ([id number]) Visit Report for 01/07/2021 Arrival Information Details Patient Name: Date of Service: [name], [place] 01/07/2021 10:30 A M Medical Record Number: [id number] Patient Account Number: [id number] Date of Birth/Sex: Treating RN: 1976-02-11 (45 y.o. [name] Primary Care Agustin Swatek: [name] Other Clinician: Referring Tayjon Halladay: Treating Amnah Breuer/Extender: [name] in Treatment: 25 Visit Information History Since Last Visit Added or deleted any medications: No Patient Arrived: Wheel Chair Any new allergies or adverse reactions: No Arrival Time: 10:33 Had a fall or experienced change in No Accompanied By: wife activities of daily living that may affect Transfer Assistance: None risk of falls: Patient Identification Verified: Yes Signs or symptoms of abuse/neglect since last visito No Secondary Verification Process Completed: Yes Hospitalized since last visit: No Patient Requires Transmission-Based Precautions: No Implantable device outside of the clinic excluding No Patient Has Alerts: No cellular tissue based products placed in the center since last visit: Has Dressing in Place as Prescribed: Yes Has Footwear/Offloading in Place as Prescribed: Yes Left: T Contact Cast otal Right: Wedge Shoe Pain Present Now: No Electronic Signature(s) Signed: 01/07/2021 5:54:05 PM By: Lorrin Jackson Entered By: Lorrin Jackson on 01/07/2021 10:40:30 -------------------------------------------------------------------------------- Encounter Discharge Information Details Patient Name: Date of Service: [name], [name]. 01/07/2021 10:30 A M Medical Record Number: [id number] Patient Account Number: [id number] Date of Birth/Sex: Treating RN: Jan 13, 1976 (45 y.o. [name] Primary Care Cian Costanzo: [name] Other Clinician: Referring Colbie Danner: Treating Rolen Conger/Extender: [name] in Treatment: 25 Encounter  Discharge Information Items Discharge Condition: Stable Ambulatory Status: Wheelchair Discharge Destination: Home Transportation: Private Auto Accompanied By: sister Schedule Follow-up Appointment: Yes Clinical Summary of Care: Patient Declined Electronic Signature(s) Signed: 01/07/2021 5:38:56 PM By: Baruch Gouty RN, BSN Entered By: Baruch Gouty on 01/07/2021 11:44:19 -------------------------------------------------------------------------------- Lower Extremity Assessment Details Patient Name: Date of Service: [name], [name] 01/07/2021 10:30 A M Medical Record Number: [id number] Patient Account Number: [id number] Date of Birth/Sex: Treating RN: 1975/10/05 (45 y.o. [name] Primary Care Ronelle Michie: [name] Other Clinician: Referring [name]: Treating Frenchie Dangerfield/Extender: [name] in Treatment: 25 Edema Assessment Assessed: [name]: Yes] [name]: Yes] Edema: [Left: Yes] [Right: Yes] Calf Left: Right: Point of Measurement: 29 cm From Medial Instep 40 cm 42.5 cm Ankle Left: Right: Point of Measurement: 9 cm From Medial Instep 25 cm 25.4 cm Vascular Assessment Pulses: Dorsalis Pedis Palpable: [Left:Yes] [Right:Yes] Electronic Signature(s) Signed: 01/07/2021 5:54:05 PM By: Lorrin Jackson Entered By: Lorrin Jackson on 01/07/2021 11:01:36 -------------------------------------------------------------------------------- Multi Wound Chart Details Patient Name: Date of Service: [name], [place] [name] 01/07/2021 10:30 A M Medical Record Number: [id number] Patient Account Number: [id number] Date of Birth/Sex: Treating RN: Jul 27, 1976 (45 y.o. M) Primary Care Liora Myles: [name] Other Clinician: Referring Dominiq Fontaine: Treating Fabiha Rougeau/Extender: [name] in Treatment: 25 Vital Signs Height(in): 69 Pulse(bpm): 91 Weight(lbs): 280 Blood Pressure(mmHg): 166/89 Body Mass Index(BMI): 41 Temperature(F): 98.7 Respiratory  Rate(breaths/min): 18 Photos: [1:No Photos Right Calcaneus] [2:No Photos Left Calcaneus] [N/A:N/A N/A] Wound Location: [1:Pressure Injury] [2:Pressure Injury] [N/A:N/A] Wounding Event: [1:Pressure Ulcer] [2:Pressure Ulcer] [N/A:N/A] Primary Etiology: [1:Asthma, Angina, Hypertension] [2:Asthma, Angina, Hypertension] [N/A:N/A] Comorbid History: [1:10/07/2019] [2:10/07/2019] [N/A:N/A] Date Acquired: [1:25] [2:25] [N/A:N/A] Weeks of Treatment: [1:Open] [2:Open] [N/A:N/A] Wound Status: [1:3x1x0.4] [2:4.5x1x0.2] [N/A:N/A] Measurements L x W x D (cm) [1:2.356] [2:3.534] [N/A:N/A] A (cm) : rea [1:0.942] [5:0.093] [N/A:N/A] Volume (cm) : [1:92.50%] [2:87.00%] [N/A:N/A] % Reduction in A rea: [1:97.00%] [2:97.40%] [N/A:N/A] % Reduction in Volume: [  1:Category/Stage III] [2:Category/Stage III] [N/A:N/A] Classification: [1:Medium] [2:Medium] [N/A:N/A] Exudate A mount: [1:N/A] [2:Serosanguineous] [N/A:N/A] Exudate Type: [1:N/A] [2:red, brown] [N/A:N/A] Exudate Color: [1:Distinct, outline attached] [2:Distinct, outline attached] [N/A:N/A] Wound Margin: [1:Large (67-100%)] [2:Large (67-100%)] [N/A:N/A] Granulation A mount: [1:Red, Pink] [2:Pink] [N/A:N/A] Granulation Quality: [1:Small (1-33%)] [2:Small (1-33%)] [N/A:N/A] Necrotic A mount: [1:Fat Layer (Subcutaneous Tissue): Yes Fat Layer (Subcutaneous Tissue): Yes N/A] Exposed Structures: [1:Fascia: No Tendon: No Muscle: No Joint: No Bone: No Medium (34-66%)] [2:Fascia: No Tendon: No Muscle: No Joint: No Bone: No Small (1-33%)] [N/A:N/A] Epithelialization: [1:N/A] [2:T Contact Cast otal] [N/A:N/A] Treatment Notes Wound #1 (Calcaneus) Wound Laterality: Right Cleanser Wound Cleanser Discharge Instruction: Cleanse the wound with wound cleanser prior to applying a clean dressing using gauze sponges, not tissue or cotton balls. Normal Saline Discharge Instruction: Cleanse the wound with Normal Saline prior to applying a clean dressing using gauze sponges,  not tissue or cotton balls. Peri-Wound Care Topical Primary Dressing KerraCel Ag Gelling Fiber Dressing, 2x2 in (silver alginate) Discharge Instruction: ****Ensure to apply into divot area of wound.**** Apply silver alginate to wound bed as instructed Secondary Dressing ABD Pad, 5x9 Discharge Instruction: Apply over primary dressing as directed. Secured With Elastic Bandage 4 inch (ACE bandage) Discharge Instruction: Secure with ACE bandage as directed. Kerlix Roll Sterile, 4.5x3.1 (in/yd) Discharge Instruction: Secure with Kerlix as directed. Compression Wrap Compression Stockings Add-Ons Wound #2 (Calcaneus) Wound Laterality: Left Cleanser Wound Cleanser Discharge Instruction: Cleanse the wound with wound cleanser prior to applying a clean dressing using gauze sponges, not tissue or cotton balls. Normal Saline Discharge Instruction: Cleanse the wound with Normal Saline prior to applying a clean dressing using gauze sponges, not tissue or cotton balls. Peri-Wound Care Zinc Oxide Ointment 30g tube Discharge Instruction: ****Apply Zinc Oxide to periwound with each dressing change**** Topical Primary Dressing KerraCel Ag Gelling Fiber Dressing, 2x2 in (silver alginate) Discharge Instruction: Apply silver alginate to wound bed as instructed Secondary Dressing Woven Gauze Sponge, Non-Sterile 4x4 in Discharge Instruction: Apply over primary dressing as directed. Zetuvit Plus 4x8 in Discharge Instruction: Apply over primary dressing as directed. ABD Pad, 8x10 Discharge Instruction: Apply over primary dressing as directed. Secured With [employer], 4.5x3.1 (in/yd) Discharge Instruction: Secure with Kerlix as directed. Compression Wrap Compression Stockings Add-Ons Electronic Signature(s) Signed: 01/07/2021 4:50:31 PM By: Linton Ham MD Entered By: Linton Ham on 01/07/2021  12:12:37 -------------------------------------------------------------------------------- Multi-Disciplinary Care Plan Details Patient Name: Date of Service: [name], [place] 01/07/2021 10:30 A M Medical Record Number: [id number] Patient Account Number: [id number] Date of Birth/Sex: Treating RN: May 03, 1976 (45 y.o. [name] Primary Care Bentleigh Waren: [name] Other Clinician: Referring Elizer Bostic: Treating Tinamarie Przybylski/Extender: [name] in Treatment: 25 Multidisciplinary Care Plan reviewed with physician Active Inactive Abuse / Safety / Falls / Self Care Management Nursing Diagnoses: Potential for falls Goals: Patient/caregiver will verbalize/demonstrate measures taken to prevent injury and/or falls Date Initiated: 07/15/2020 Target Resolution Date: 02/05/2021 Goal Status: Active Interventions: Assess fall risk on admission and as needed Assess impairment of mobility on admission and as needed per policy Notes: Electronic Signature(s) Signed: 01/07/2021 5:16:10 PM By: [name] Entered By: [name] on 01/07/2021 11:14:01 -------------------------------------------------------------------------------- Pain Assessment Details Patient Name: Date of Service: [name], [place] 01/07/2021 10:30 A M Medical Record Number: [id number] Patient Account Number: [id number] Date of Birth/Sex: Treating RN: 1976-05-03 (45 y.o. [name] Primary Care Yolinda Duerr: [name] Other Clinician: Referring Jules Baty: Treating Timithy Arons/Extender: [name] in Treatment: 25 Active  Problems Location of Pain Severity and Description of Pain Patient Has Paino No Site Locations Pain Management and Medication Current Pain Management: Electronic Signature(s) Signed: 01/07/2021 5:54:05 PM By: Lorrin Jackson Entered By: Lorrin Jackson on 01/07/2021  10:41:59 -------------------------------------------------------------------------------- Patient/Caregiver Education Details Patient Name: Date of Service: [place], [name] 6/2/2022andnbsp10:30 A M Medical Record Number: [id number] Patient Account Number: [id number] Date of Birth/Gender: Treating RN: [birth year] (45 y.o. [name] Primary Care Physician: [name] Other Clinician: Referring Physician: Treating Physician/Extender: [name] in Treatment: 25 Education Assessment Education Provided To: Patient Education Topics Provided Wound/Skin Impairment: Handouts: Skin Care Do's and Dont's Methods: Explain/Verbal Responses: Reinforcements needed Electronic Signature(s) Signed: 01/07/2021 5:16:10 PM By: [name] Entered By: [name] on 01/07/2021 11:14:16 -------------------------------------------------------------------------------- Wound Assessment Details Patient Name: Date of Service: [name], [place] 01/07/2021 10:30 A M Medical Record Number: [id number] Patient Account Number: [id number] Date of Birth/Sex: Treating RN: 30-Jun-1976 (45 y.o. [name] Primary Care Syncere Kaminski: [name] Other Clinician: Referring Daeshaun Specht: Treating Mccormick Macon/Extender: [name] in Treatment: 25 Wound Status Wound Number: 1 Primary Etiology: Pressure Ulcer Wound Location: Right Calcaneus Wound Status: Open Wounding Event: Pressure Injury Comorbid History: Asthma, Angina, Hypertension Date Acquired: 10/07/2019 Weeks Of Treatment: 25 Clustered Wound: No Photos Wound Measurements Length: (cm) 3 Width: (cm) 1 Depth: (cm) 0.4 Area: (cm) 2.356 Volume: (cm) 0.942 % Reduction in Area: 92.5% % Reduction in Volume: 97% Epithelialization: Medium (34-66%) Tunneling: No Undermining: No Wound Description Classification: Category/Stage III Wound Margin: Distinct, outline attached Exudate Amount: Medium Foul  Odor After Cleansing: No Slough/Fibrino Yes Wound Bed Granulation Amount: Large (67-100%) Exposed Structure Granulation Quality: Red, Pink Fascia Exposed: No Necrotic Amount: Small (1-33%) Fat Layer (Subcutaneous Tissue) Exposed: Yes Tendon Exposed: No Muscle Exposed: No Joint Exposed: No Bone Exposed: No Treatment Notes Wound #1 (Calcaneus) Wound Laterality: Right Cleanser Wound Cleanser Discharge Instruction: Cleanse the wound with wound cleanser prior to applying a clean dressing using gauze sponges, not tissue or cotton balls. Normal Saline Discharge Instruction: Cleanse the wound with Normal Saline prior to applying a clean dressing using gauze sponges, not tissue or cotton balls. Peri-Wound Care Topical Primary Dressing KerraCel Ag Gelling Fiber Dressing, 2x2 in (silver alginate) Discharge Instruction: ****Ensure to apply into divot area of wound.**** Apply silver alginate to wound bed as instructed Secondary Dressing ABD Pad, 5x9 Discharge Instruction: Apply over primary dressing as directed. Secured With Elastic Bandage 4 inch (ACE bandage) Discharge Instruction: Secure with ACE bandage as directed. Kerlix Roll Sterile, 4.5x3.1 (in/yd) Discharge Instruction: Secure with Kerlix as directed. Compression Wrap Compression Stockings Add-Ons Electronic Signature(s) Signed: 01/07/2021 5:54:05 PM By: Lorrin Jackson Signed: 01/08/2021 5:48:41 PM By: Sandre Kitty Entered By: Sandre Kitty on 01/07/2021 16:44:59 -------------------------------------------------------------------------------- Wound Assessment Details Patient Name: Date of Service: [name], [place] 01/07/2021 10:30 A M Medical Record Number: [id number] Patient Account Number: [id number] Date of Birth/Sex: Treating RN: 12-04-1975 (45 y.o. [name] Primary Care Deloy Archey: [name] Other Clinician: Referring Henrine Hayter: Treating Natalyn Szymanowski/Extender: [name] in Treatment:  25 Wound Status Wound Number: 2 Primary Etiology: Pressure Ulcer Wound Location: Left Calcaneus Wound Status: Open Wounding Event: Pressure Injury Comorbid History: Asthma, Angina, Hypertension Date Acquired: 10/07/2019 Weeks Of Treatment: 25 Clustered Wound: No Photos Wound Measurements Length: (cm) 4.5 Width: (cm) 1 Depth: (cm) 0.2 Area: (cm) 3.534 Volume: (cm) 0.707 % Reduction in Area: 87% % Reduction in Volume: 97.4% Epithelialization: Small (1-33%) Tunneling: No Undermining:  No Wound Description Classification: Category/Stage III Wound Margin: Distinct, outline attached Exudate Amount: Medium Exudate Type: Serosanguineous Exudate Color: red, brown Wound Bed Granulation Amount: Large (67-100%) Granulation Quality: Pink Necrotic Amount: Small (1-33%) Necrotic Quality: Adherent Slough Foul Odor After Cleansing: No Slough/Fibrino Yes Exposed Structure Fascia Exposed: No Fat Layer (Subcutaneous Tissue) Exposed: Yes Tendon Exposed: No Muscle Exposed: No Joint Exposed: No Bone Exposed: No Treatment Notes Wound #2 (Calcaneus) Wound Laterality: Left Cleanser Wound Cleanser Discharge Instruction: Cleanse the wound with wound cleanser prior to applying a clean dressing using gauze sponges, not tissue or cotton balls. Normal Saline Discharge Instruction: Cleanse the wound with Normal Saline prior to applying a clean dressing using gauze sponges, not tissue or cotton balls. Peri-Wound Care Zinc Oxide Ointment 30g tube Discharge Instruction: ****Apply Zinc Oxide to periwound with each dressing change**** Topical Primary Dressing KerraCel Ag Gelling Fiber Dressing, 2x2 in (silver alginate) Discharge Instruction: Apply silver alginate to wound bed as instructed Secondary Dressing Woven Gauze Sponge, Non-Sterile 4x4 in Discharge Instruction: Apply over primary dressing as directed. Zetuvit Plus 4x8 in Discharge Instruction: Apply over primary dressing as  directed. ABD Pad, 8x10 Discharge Instruction: Apply over primary dressing as directed. Secured With [employer], 4.5x3.1 (in/yd) Discharge Instruction: Secure with Kerlix as directed. Compression Wrap Compression Stockings Add-Ons Electronic Signature(s) Signed: 01/07/2021 5:54:05 PM By: Lorrin Jackson Signed: 01/08/2021 5:48:41 PM By: Sandre Kitty Entered By: Sandre Kitty on 01/07/2021 16:46:31 -------------------------------------------------------------------------------- [place] Details Patient Name: Date of Service: [name], [place] 01/07/2021 10:30 A M Medical Record Number: [id number] Patient Account Number: [id number] Date of Birth/Sex: Treating RN: 21-Apr-1976 (45 y.o. [name] Primary Care Natonya Finstad: [name] Other Clinician: Referring Skila Rollins: Treating Jonathon Tan/Extender: [name] in Treatment: 25 Vital Signs Time Taken: 10:40 Temperature (F): 98.7 Height (in): 69 Pulse (bpm): 91 Weight (lbs): 280 Respiratory Rate (breaths/min): 18 Body Mass Index (BMI): 41.3 Blood Pressure (mmHg): 166/89 Reference Range: 80 - 120 mg / dl Electronic Signature(s) Signed: 01/07/2021 5:54:05 PM By: Lorrin Jackson Entered By: Lorrin Jackson on 01/07/2021 10:41:44

## 2021-01-14 ENCOUNTER — Encounter (HOSPITAL_BASED_OUTPATIENT_CLINIC_OR_DEPARTMENT_OTHER): Payer: BC Managed Care – PPO | Admitting: Internal Medicine

## 2021-01-14 ENCOUNTER — Other Ambulatory Visit: Payer: Self-pay

## 2021-01-14 DIAGNOSIS — L97422 Non-pressure chronic ulcer of left heel and midfoot with fat layer exposed: Secondary | ICD-10-CM | POA: Diagnosis not present

## 2021-01-14 DIAGNOSIS — L97412 Non-pressure chronic ulcer of right heel and midfoot with fat layer exposed: Secondary | ICD-10-CM | POA: Diagnosis not present

## 2021-01-14 DIAGNOSIS — L97512 Non-pressure chronic ulcer of other part of right foot with fat layer exposed: Secondary | ICD-10-CM | POA: Diagnosis not present

## 2021-01-14 DIAGNOSIS — L89893 Pressure ulcer of other site, stage 3: Secondary | ICD-10-CM | POA: Diagnosis not present

## 2021-01-14 DIAGNOSIS — L97522 Non-pressure chronic ulcer of other 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: Secondary | ICD-10-CM | POA: Diagnosis not present

## 2021-01-14 NOTE — Progress Notes (Signed)
[name], [name] ([id number]) Visit Report for 01/14/2021 HPI Details Patient Name: Date of Service: [name], [name] 01/14/2021 10:30 A M Medical Record Number: [id number] Patient Account Number: [id number] Date of Birth/Sex: Treating RN: 11/04/1975 (45 y.o. [name] Primary Care Provider: [name] Other Clinician: Referring Provider: Treating Provider/Extender: [name] in Treatment: 26 History of Present Illness HPI Description: 07/15/2020 upon evaluation today patient presents for initial inspection here in our clinic concerning issues he has been having with the bottoms of his feet bilaterally. He states these actually occurred as wounds when he was hospitalized for 5 months secondary to Covid. He was apparently with tilting bed where he was in an upright position quite frequently and apparently this occurred in some way shape or form during that time. Fortunately there is no sign of active infection at this time. No fevers, chills, nausea, vomiting, or diarrhea. With that being said he still has substantial wounds on the plantar aspects of his feet Theragen require quite a bit of work to get these to heal. He has been using Santyl currently though that is been problematic both in receiving the medication as well as actually paid for it as it is become quite expensive. Prior to the experience with Covid the patient really did not have any major medical problems other than hypertension he does have some mild generalized weakness following the Covid experience. 07/22/2020 on evaluation today patient appears to be doing okay in regard to his foot ulcers I feel like the wound beds are showing signs of better improvement that I do believe the Iodoflex is helping in this regard. With that being said he does have a lot of drainage currently and this is somewhat blue/green in nature which is consistent with Pseudomonas. I do think a culture today would be appropriate  for [place] to evaluate and see if that is indeed the case I would likely start him on antibiotic orally as well he is not allergic to Cipro knows of no issues he has had in the past 12/21; patient was admitted to the clinic earlier this month with bilateral presumed pressure ulcers on the bottom of his feet apparently related to excessive pressure from a tilt table arrangement in the intensive care unit. Patient relates this to being on ECMO but I am not really sure that is exactly related to that. I must say I have never seen anything like this. He has fairly extensive full-thickness wounds extending from his heel towards his midfoot mostly centered laterally. There is already been some healing distally. He does not appear to have an arterial issue. He has been using gentamicin to the wound surfaces with Iodoflex to help with ongoing debridement 1/6; this is a patient with pressure ulcers on the bottom of his feet related to excessive pressure from a standing position in the intensive care unit. He is complaining of a lot of pain in the right heel. He is not a diabetic. 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 We have been using Iodoflex to help prepare the surfaces of both wounds for an advanced treatment product. He is nonambulatory spending most of his time in a wheelchair I have asked him not to propel the wheelchair with his heels 1/13; in general his wounds look better not much surface area change we have been using Iodoflex as of last week. I did an x-ray of the right heel as the patient was complaining of pain. I  had some thoughts about a stress fracture perhaps Achilles tendon problems however what it showed was erosive changes along the inferior aspect of the calcaneus he now has a MRI booked for 1/20. 1/20; in general his wounds continue to be better. Some improvement in the large narrow areas proximally in his foot. He is still complaining of pain in the right heel  and tenderness in certain areas of this wound. His MRI is tonight. I am not just looking for osteomyelitis that was brought up on the x-ray I am wondering about stress fractures, tendon ruptures etc. He has no such findings on the left. Also noteworthy is that the patient had critical illness neuropathy and some of the discomfort may be actual improvement in nerve function I am just not sure. These wounds were initially in the setting of severe critical illness related to COVID-19. He was put in a standing position. He may have also been on pressors at the point contributing to tissue ischemia. By his description at some point these wounds were grossly necrotic extending proximally up into the Achilles part of his heel. I do not know that I have ever really seen pictures of them like this although they may exist in epic We have ordered Tri layer Oasis. I am trying to stimulate some granulation in these areas. This is of course assuming the MRI is negative for infection 1/27; since the patient was last here he saw Dr. Juleen China of infectious disease. He is planned for vancomycin and ceftriaxone. Prior operative culture grew MSSA. Also ordered baseline lab work. He also ordered arterial studies although the ABIs in our clinic were normal as well as his clinical exam these were normal I do not think he needs to see vascular surgery. His ABIs at the PTA were 1.22 in the right triphasic waveforms with a normal TBI of 1.15 on the left ABI of 1.22 with triphasic waveforms and a normal TBI of 1.08. Finally he saw Dr. Amalia Hailey who will follow him in for 2 months. At this point I do not think he felt that he needed a procedure on the right calcaneal bone. Dr. Juleen China is elected for broad-spectrum antibiotic The patient is still having pain in the right heel. He walks with a walker 2/3; wounds are generally smaller. He is tolerating his IV antibiotics. I believe this is vancomycin and ceftriaxone. We are still  waiting for Oasis burn in terms of his out-of-pocket max which he should be meeting soon given the IV antibiotics, MRIs etc. I have asked him to check in on this. We are using silver collagen in the meantime the wounds look better 2/10; tolerating IV vancomycin and Rocephin. We are waiting to apply for Oasis. Although I am not really sure where he is in his out-of-pocket max. 2/17 started the first application of Oasis trilayer. Still on antibiotics. The wounds have generally look better. The area on the left has a little more surface slough requiring debridement 0/62; second application of Oasis trilayer. The wound surface granulation is generally look better. The area on the left with undermining laterally I think is come in a bit. 10/08/2020 upon evaluation today patient is here today for [employer] application #3. Fortunately he seems to be doing extremely well with regard to this and we are seeing a lot of new epithelial growth which is great news. Fortunately there is no signs of active infection at this time. 10/16/2020 upon evaluation today patient appears to be doing well with regard  to his foot ulcers. Do believe the Oasis has been of benefit for him. I do not see any signs of infection right now which is great news and I think that he has a lot of new epithelial growth which is great to see as well. The patient is very pleased to hear all of this. I do think we can proceed with the Oasis trilayer #4 today. 3/18; not as much improvement in these areas on his heels that I was hoping. I did reapply trilateral Oasis today the tissue looks healthier but not as much fill in as I was hoping. 3/25; better looking today I think this is come in a bit the tissue looks healthier. Triple layer Oasis reapplied #6 4/1; somewhat better looking definitely better looking surface not as much change in surface area as I was hoping. He may be spending more time [place] on days then he needs to although he does  have heel offloading boots. Triple layer Oasis reapplied #7 4/7; unfortunately apparently [facility] will not approve any further Oasis which is unfortunate since the patient did respond nicely both in terms of the condition of the wound bed as well as surface area. There is still some drainage coming from the wound but not a lot there does not appear to be any infection 4/15; we have been using Hydrofera Blue. He continues to have nice rims of epithelialization on the right greater than the left. The left the epithelialization is coming from the tip of his heel. There is moderate drainage. In this that concerns me about a total contact cast. There is no evidence of infection 4/29; patient has been using Hydrofera Blue with dressing changes. He has no complaints or issues today. 5/5; using Hydrofera Blue. I actually think that he looks marginally better than the last time I saw this 3 weeks ago. There are rims of epithelialization on the left thumb coming from the medial side on the right. Using Hydrofera Blue 5/12; using Hydrofera Blue. These continue to make improvements in surface area. His drainage was not listed as severe I therefore went ahead and put a cast on the left foot. Right foot we will continue to dress his previous 5/16; back for first total contact cast change. He did not tolerate this particularly well cast injury on the anterior tibia among other issues. Difficulty sleeping. I talked him about this in some detail and afterwards is elected to continue. I told him I would like to have a cast on for 3 weeks to see if this is going to help at all. I think he agreed 5/19; I think the wound is better. There is no tunneling towards his midfoot. The undermining medially also looks better. He has a rim of new skin distally. I think we are making progress here. The area on the left also continues to look somewhat better to me using Hydrofera Blue. He has a list of complaints about  the cast but none of them seem serious 5/26; patient presents for 1 week follow-up. He has been using a total contact cast and tolerating this well. Hydrofera Blue is the main dressing used. He denies signs of infection. 6/2 Hydrofera Blue total contact cast on the left. These were large ulcers that formed in intensive care unit where the patient was recovering from [place]. May have had something to do with being ventilated in an upright positiono Pressors etc. We have been able to get the areas down considerably and a  viable surface. There is some epithelialization in both sides. Note made of drainage 6/9; changed to [facility] last time because of drainage. He arrives with better looking surfaces and dimensions on the left than the right. Paradoxically the right actually probes more towards his midfoot the left is largely close down but both of these look improved. Using a total contact cast on the left Electronic Signature(s) Signed: 01/14/2021 4:38:36 PM By: Linton Ham MD Entered By: Linton Ham on 01/14/2021 11:47:33 -------------------------------------------------------------------------------- Physical Exam Details Patient Name: Date of Service: [name], [place] 01/14/2021 10:30 A M Medical Record Number: [id number] Patient Account Number: [id number] Date of Birth/Sex: Treating RN: October 18, 1975 (45 y.o. [name] Primary Care Provider: [name] Other Clinician: Referring Provider: Treating Provider/Extender: [name] in Treatment: 55 Constitutional Patient is hypertensive.. Pulse regular and within target range for patient.Marland Kitchen Respirations regular, non-labored and within target range.. Temperature is normal and within the target range for the patient.Marland Kitchen Appears in no distress. Notes Wound exam; bilateral heel wounds. Surface of the wounds again looks fairly good and dimensions are smaller. No evidence of infection. We still have the probing  area on the right towards the middle of the foot. Eschar on the right as well I did not remove this. Electronic Signature(s) Signed: 01/14/2021 4:38:36 PM By: Linton Ham MD Entered By: Linton Ham on 01/14/2021 11:48:22 -------------------------------------------------------------------------------- Physician Orders Details Patient Name: Date of Service: [name], [place] 01/14/2021 10:30 A M Medical Record Number: [id number] Patient Account Number: [id number] Date of Birth/Sex: Treating RN: May 25, 1976 (45 y.o. [name] Primary Care Provider: [name] Other Clinician: Referring Provider: Treating Provider/Extender: [name] in Treatment: 26 Verbal / Phone Orders: No Diagnosis Coding ICD-10 Coding Code Description [phone] Pressure ulcer of other site, stage 3 M62.81 Muscle weakness (generalized) I10 Essential (primary) hypertension L97.512 Non-pressure chronic ulcer of other part of right foot with fat layer exposed L97.522 Non-pressure chronic ulcer of other 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 Follow-up Appointments ppointment in 1 week. - Dr. Dellia Nims Thursday for [facility] appt. ****60 minutes for cast changes**** Return A Bathing/ Shower/ Hygiene May shower with protection but do not get wound dressing(s) wet. Edema Control - Lymphedema / SCD / Other Bilateral Lower Extremities Elevate legs to the level of the heart or above for 30 minutes daily and/or when sitting, a frequency of: Avoid standing for long periods of time. Moisturize legs daily. - right leg and foot every night. Off-Loading Total Contact Cast to Left Lower Extremity Wedge shoe to: - Glophed Shoe to right foot Other: - keep pressure off of the bottom of your feet Additional Orders / Instructions Follow Nutritious Diet Wound Treatment Wound #1 - Calcaneus Wound Laterality: Right Cleanser: Normal Saline (Generic) Every Other Day/15 Days Discharge Instructions: Cleanse  the wound with Normal Saline prior to applying a clean dressing using gauze sponges, not tissue or cotton balls. Cleanser: Wound Cleanser Every Other Day/15 Days Discharge Instructions: Cleanse the wound with wound cleanser prior to applying a clean dressing using gauze sponges, not tissue or cotton balls. Prim Dressing: KerraCel Ag Gelling Fiber Dressing, 2x2 in (silver alginate) (Generic) Every Other Day/15 Days ary Discharge Instructions: ****Ensure to apply into divot area of wound.**** Apply silver alginate to wound bed as instructed Secondary Dressing: ABD Pad, 5x9 Every Other Day/15 Days Discharge Instructions: Apply over primary dressing as directed. Secured With: Elastic Bandage 4 inch (ACE bandage) Every Other Day/15 Days  Discharge Instructions: Secure with ACE bandage as directed. Secured With: [employer], 4.5x3.1 (in/yd) (Generic) Every Other Day/15 Days Discharge Instructions: Secure with Kerlix as directed. Wound #2 - Calcaneus Wound Laterality: Left Cleanser: Normal Saline (Generic) Every Other Day/15 Days Discharge Instructions: Cleanse the wound with Normal Saline prior to applying a clean dressing using gauze sponges, not tissue or cotton balls. Cleanser: Wound Cleanser Every Other Day/15 Days Discharge Instructions: Cleanse the wound with wound cleanser prior to applying a clean dressing using gauze sponges, not tissue or cotton balls. Peri-Wound Care: Zinc Oxide Ointment 30g tube Every Other Day/15 Days Discharge Instructions: ****Apply Zinc Oxide to periwound with each dressing change**** Prim Dressing: KerraCel Ag Gelling Fiber Dressing, 2x2 in (silver alginate) Every Other Day/15 Days ary Discharge Instructions: Apply silver alginate to wound bed as instructed Secondary Dressing: Woven Gauze Sponge, Non-Sterile 4x4 in (Generic) Every Other Day/15 Days Discharge Instructions: Apply over primary dressing as directed. Secondary Dressing: ABD Pad, 8x10 (Generic)  Every Other Day/15 Days Discharge Instructions: Apply over primary dressing as directed. Secondary Dressing: Zetuvit Plus 4x8 in (Generic) Every Other Day/15 Days Discharge Instructions: Apply over primary dressing as directed. Secured With: [employer], 4.5x3.1 (in/yd) (Generic) Every Other Day/15 Days Discharge Instructions: Secure with Kerlix as directed. Electronic Signature(s) Signed: 01/14/2021 4:38:36 PM By: Linton Ham MD Signed: 01/14/2021 6:02:04 PM By: [name] Entered By: [name] on 01/14/2021 11:43:20 -------------------------------------------------------------------------------- Problem List Details Patient Name: Date of Service: [name], [place] 01/14/2021 10:30 A M Medical Record Number: [id number] Patient Account Number: [id number] Date of Birth/Sex: Treating RN: 07-04-1976 (45 y.o. [name] Primary Care Provider: [name] Other Clinician: Referring Provider: Treating Provider/Extender: [name] in Treatment: 26 Active Problems ICD-10 Encounter Code Description Active Date MDM Diagnosis [phone] Pressure ulcer of other site, stage 3 07/15/2020 No Yes M62.81 Muscle weakness (generalized) 07/15/2020 No Yes I10 Essential (primary) hypertension 07/15/2020 No Yes L97.512 Non-pressure chronic ulcer of other part of right foot with fat layer exposed 09/03/2020 No Yes L97.522 Non-pressure chronic ulcer of other 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 09/03/2020 No Yes Inactive Problems ICD-10 Code Description Active Date Inactive Date M86.171 Other acute osteomyelitis, right ankle and foot 09/03/2020 09/03/2020 Resolved Problems Electronic Signature(s) Signed: 01/14/2021 4:38:36 PM By: Linton Ham MD Entered By: Linton Ham on 01/14/2021 11:46:32 -------------------------------------------------------------------------------- Progress Note Details Patient Name: Date of Service: [name], [place] 01/14/2021  10:30 A M Medical Record Number: [id number] Patient Account Number: [id number] Date of Birth/Sex: Treating RN: 1976/08/08 (45 y.o. [name] Primary Care Provider: [name] Other Clinician: Referring Provider: Treating Provider/Extender: [name] in Treatment: 26 Subjective History of Present Illness (HPI) 07/15/2020 upon evaluation today patient presents for initial inspection here in our clinic concerning issues he has been having with the bottoms of his feet bilaterally. He states these actually occurred as wounds when he was hospitalized for 5 months secondary to Covid. He was apparently with tilting bed where he was in an upright position quite frequently and apparently this occurred in some way shape or form during that time. Fortunately there is no sign of active infection at this time. No fevers, chills, nausea, vomiting, or diarrhea. With that being said he still has substantial wounds on the plantar aspects of his feet Theragen require quite a bit of work to get these to heal. He has been using Santyl currently though that is been problematic both in receiving  the medication as well as actually paid for it as it is become quite expensive. Prior to the experience with Covid the patient really did not have any major medical problems other than hypertension he does have some mild generalized weakness following the Covid experience. 07/22/2020 on evaluation today patient appears to be doing okay in regard to his foot ulcers I feel like the wound beds are showing signs of better improvement that I do believe the Iodoflex is helping in this regard. With that being said he does have a lot of drainage currently and this is somewhat blue/green in nature which is consistent with Pseudomonas. I do think a culture today would be appropriate for [place] to evaluate and see if that is indeed the case I would likely start him on antibiotic orally as well he is not allergic  to Cipro knows of no issues he has had in the past 12/21; patient was admitted to the clinic earlier this month with bilateral presumed pressure ulcers on the bottom of his feet apparently related to excessive pressure from a tilt table arrangement in the intensive care unit. Patient relates this to being on ECMO but I am not really sure that is exactly related to that. I must say I have never seen anything like this. He has fairly extensive full-thickness wounds extending from his heel towards his midfoot mostly centered laterally. There is already been some healing distally. He does not appear to have an arterial issue. He has been using gentamicin to the wound surfaces with Iodoflex to help with ongoing debridement 1/6; this is a patient with pressure ulcers on the bottom of his feet related to excessive pressure from a standing position in the intensive care unit. He is complaining of a lot of pain in the right heel. He is not a diabetic. 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 We have been using Iodoflex to help prepare the surfaces of both wounds for an advanced treatment product. He is nonambulatory spending most of his time in a wheelchair I have asked him not to propel the wheelchair with his heels 1/13; in general his wounds look better not much surface area change we have been using Iodoflex as of last week. I did an x-ray of the right heel as the patient was complaining of pain. I had some thoughts about a stress fracture perhaps Achilles tendon problems however what it showed was erosive changes along the inferior aspect of the calcaneus he now has a MRI booked for 1/20. 1/20; in general his wounds continue to be better. Some improvement in the large narrow areas proximally in his foot. He is still complaining of pain in the right heel and tenderness in certain areas of this wound. His MRI is tonight. I am not just looking for osteomyelitis that was brought up on  the x-ray I am wondering about stress fractures, tendon ruptures etc. He has no such findings on the left. Also noteworthy is that the patient had critical illness neuropathy and some of the discomfort may be actual improvement in nerve function I am just not sure. These wounds were initially in the setting of severe critical illness related to COVID-19. He was put in a standing position. He may have also been on pressors at the point contributing to tissue ischemia. By his description at some point these wounds were grossly necrotic extending proximally up into the Achilles part of his heel. I do not know that I have ever really  seen pictures of them like this although they may exist in epic We have ordered Tri layer Oasis. I am trying to stimulate some granulation in these areas. This is of course assuming the MRI is negative for infection 1/27; since the patient was last here he saw Dr. Juleen China of infectious disease. He is planned for vancomycin and ceftriaxone. Prior operative culture grew MSSA. Also ordered baseline lab work. He also ordered arterial studies although the ABIs in our clinic were normal as well as his clinical exam these were normal I do not think he needs to see vascular surgery. His ABIs at the PTA were 1.22 in the right triphasic waveforms with a normal TBI of 1.15 on the left ABI of 1.22 with triphasic waveforms and a normal TBI of 1.08. Finally he saw Dr. Amalia Hailey who will follow him in for 2 months. At this point I do not think he felt that he needed a procedure on the right calcaneal bone. Dr. Juleen China is elected for broad-spectrum antibiotic The patient is still having pain in the right heel. He walks with a walker 2/3; wounds are generally smaller. He is tolerating his IV antibiotics. I believe this is vancomycin and ceftriaxone. We are still waiting for Oasis burn in terms of his out-of-pocket max which he should be meeting soon given the IV antibiotics, MRIs etc. I have  asked him to check in on this. We are using silver collagen in the meantime the wounds look better 2/10; tolerating IV vancomycin and Rocephin. We are waiting to apply for Oasis. Although I am not really sure where he is in his out-of-pocket max. 2/17 started the first application of Oasis trilayer. Still on antibiotics. The wounds have generally look better. The area on the left has a little more surface slough requiring debridement 7/37; second application of Oasis trilayer. The wound surface granulation is generally look better. The area on the left with undermining laterally I think is come in a bit. 10/08/2020 upon evaluation today patient is here today for [employer] application #3. Fortunately he seems to be doing extremely well with regard to this and we are seeing a lot of new epithelial growth which is great news. Fortunately there is no signs of active infection at this time. 10/16/2020 upon evaluation today patient appears to be doing well with regard to his foot ulcers. Do believe the Oasis has been of benefit for him. I do not see any signs of infection right now which is great news and I think that he has a lot of new epithelial growth which is great to see as well. The patient is very pleased to hear all of this. I do think we can proceed with the Oasis trilayer #4 today. 3/18; not as much improvement in these areas on his heels that I was hoping. I did reapply trilateral Oasis today the tissue looks healthier but not as much fill in as I was hoping. 3/25; better looking today I think this is come in a bit the tissue looks healthier. Triple layer Oasis reapplied #6 4/1; somewhat better looking definitely better looking surface not as much change in surface area as I was hoping. He may be spending more time [place] on days then he needs to although he does have heel offloading boots. Triple layer Oasis reapplied #7 4/7; unfortunately apparently [facility] will not approve  any further Oasis which is unfortunate since the patient did respond nicely both in terms of the condition of  the wound bed as well as surface area. There is still some drainage coming from the wound but not a lot there does not appear to be any infection 4/15; we have been using Hydrofera Blue. He continues to have nice rims of epithelialization on the right greater than the left. The left the epithelialization is coming from the tip of his heel. There is moderate drainage. In this that concerns me about a total contact cast. There is no evidence of infection 4/29; patient has been using Hydrofera Blue with dressing changes. He has no complaints or issues today. 5/5; using Hydrofera Blue. I actually think that he looks marginally better than the last time I saw this 3 weeks ago. There are rims of epithelialization on the left thumb coming from the medial side on the right. Using Hydrofera Blue 5/12; using Hydrofera Blue. These continue to make improvements in surface area. His drainage was not listed as severe I therefore went ahead and put a cast on the left foot. Right foot we will continue to dress his previous 5/16; back for first total contact cast change. He did not tolerate this particularly well cast injury on the anterior tibia among other issues. Difficulty sleeping. I talked him about this in some detail and afterwards is elected to continue. I told him I would like to have a cast on for 3 weeks to see if this is going to help at all. I think he agreed 5/19; I think the wound is better. There is no tunneling towards his midfoot. The undermining medially also looks better. He has a rim of new skin distally. I think we are making progress here. The area on the left also continues to look somewhat better to me using Hydrofera Blue. He has a list of complaints about the cast but none of them seem serious 5/26; patient presents for 1 week follow-up. He has been using a total contact cast and  tolerating this well. Hydrofera Blue is the main dressing used. He denies signs of infection. 6/2 Hydrofera Blue total contact cast on the left. These were large ulcers that formed in intensive care unit where the patient was recovering from [place]. May have had something to do with being ventilated in an upright positiono Pressors etc. We have been able to get the areas down considerably and a viable surface. There is some epithelialization in both sides. Note made of drainage 6/9; changed to [facility] last time because of drainage. He arrives with better looking surfaces and dimensions on the left than the right. Paradoxically the right actually probes more towards his midfoot the left is largely close down but both of these look improved. Using a total contact cast on the left Objective Constitutional Patient is hypertensive.. Pulse regular and within target range for patient.Marland Kitchen Respirations regular, non-labored and within target range.. Temperature is normal and within the target range for the patient.Marland Kitchen Appears in no distress. Vitals Time Taken: 11:13 AM, Height: 69 in, Weight: 280 lbs, BMI: 41.3, Temperature: 98.7 F, Pulse: 82 bpm, Respiratory Rate: 18 breaths/min, Blood Pressure: 153/82 mmHg. General Notes: Wound exam; bilateral heel wounds. Surface of the wounds again looks fairly good and dimensions are smaller. No evidence of infection. We still have the probing area on the right towards the middle of the foot. Eschar on the right as well I did not remove this. Integumentary (Hair, Skin) Wound #1 status is Open. Original cause of wound was Pressure Injury. The date acquired was: 10/07/2019. The wound  has been in treatment 26 weeks. The wound is located on the Right Calcaneus. The wound measures 3cm length x 0.8cm width x 0.4cm depth; 1.885cm^2 area and 0.754cm^3 volume. There is Fat Layer (Subcutaneous Tissue) exposed. There is no tunneling or undermining noted. There is a medium amount  of drainage noted. The wound margin is distinct with the outline attached to the wound base. There is large (67-100%) red, pink granulation within the wound bed. There is no necrotic tissue within the wound bed. Wound #2 status is Open. Original cause of wound was Pressure Injury. The date acquired was: 10/07/2019. The wound has been in treatment 26 weeks. The wound is located on the Left Calcaneus. The wound measures 4cm length x 1.2cm width x 0.3cm depth; 3.77cm^2 area and 1.131cm^3 volume. There is Fat Layer (Subcutaneous Tissue) exposed. There is no tunneling or undermining noted. There is a medium amount of serosanguineous drainage noted. The wound margin is distinct with the outline attached to the wound base. There is large (67-100%) pink granulation within the wound bed. There is no necrotic tissue within the wound bed. Assessment Active Problems ICD-10 Pressure ulcer of other site, stage 3 Muscle weakness (generalized) Essential (primary) hypertension Non-pressure chronic ulcer of other part of right foot with fat layer exposed Non-pressure chronic ulcer of other 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 Procedures Wound #2 Pre-procedure diagnosis of Wound #2 is a Pressure Ulcer located on the Left Calcaneus . There was a T Contact Cast Procedure by Deno Etienne MD. Post procedure Diagnosis Wound #2: Same as Pre-Procedure Plan Follow-up Appointments: Return Appointment in 1 week. - Dr. Dellia Nims Thursday for [facility] appt. ****60 minutes for cast changes**** Bathing/ Shower/ Hygiene: May shower with protection but do not get wound dressing(s) wet. Edema Control - Lymphedema / SCD / Other: Elevate legs to the level of the heart or above for 30 minutes daily and/or when sitting, a frequency of: Avoid standing for long periods of time. Moisturize legs daily. - right leg and foot every night. Off-Loading: T Contact Cast to Left Lower Extremity otal Wedge shoe to: - Glophed  Shoe to right foot Other: - keep pressure off of the bottom of your feet Additional Orders / Instructions: Follow Nutritious Diet WOUND #1: - Calcaneus Wound Laterality: Right Cleanser: Normal Saline (Generic) Every Other Day/15 Days Discharge Instructions: Cleanse the wound with Normal Saline prior to applying a clean dressing using gauze sponges, not tissue or cotton balls. Cleanser: Wound Cleanser Every Other Day/15 Days Discharge Instructions: Cleanse the wound with wound cleanser prior to applying a clean dressing using gauze sponges, not tissue or cotton balls. Prim Dressing: KerraCel Ag Gelling Fiber Dressing, 2x2 in (silver alginate) (Generic) Every Other Day/15 Days ary Discharge Instructions: ****Ensure to apply into divot area of wound.**** Apply silver alginate to wound bed as instructed Secondary Dressing: ABD Pad, 5x9 Every Other Day/15 Days Discharge Instructions: Apply over primary dressing as directed. Secured With: Elastic Bandage 4 inch (ACE bandage) Every Other Day/15 Days Discharge Instructions: Secure with ACE bandage as directed. Secured With: [employer], 4.5x3.1 (in/yd) (Generic) Every Other Day/15 Days Discharge Instructions: Secure with Kerlix as directed. WOUND #2: - Calcaneus Wound Laterality: Left Cleanser: Normal Saline (Generic) Every Other Day/15 Days Discharge Instructions: Cleanse the wound with Normal Saline prior to applying a clean dressing using gauze sponges, not tissue or cotton balls. Cleanser: Wound Cleanser Every Other Day/15 Days Discharge Instructions: Cleanse the wound with wound cleanser prior to applying  a clean dressing using gauze sponges, not tissue or cotton balls. Peri-Wound Care: Zinc Oxide Ointment 30g tube Every Other Day/15 Days Discharge Instructions: ****Apply Zinc Oxide to periwound with each dressing change**** Prim Dressing: KerraCel Ag Gelling Fiber Dressing, 2x2 in (silver alginate) Every Other Day/15  Days ary Discharge Instructions: Apply silver alginate to wound bed as instructed Secondary Dressing: Woven Gauze Sponge, Non-Sterile 4x4 in (Generic) Every Other Day/15 Days Discharge Instructions: Apply over primary dressing as directed. Secondary Dressing: ABD Pad, 8x10 (Generic) Every Other Day/15 Days Discharge Instructions: Apply over primary dressing as directed. Secondary Dressing: Zetuvit Plus 4x8 in (Generic) Every Other Day/15 Days Discharge Instructions: Apply over primary dressing as directed. Secured With: [employer], 4.5x3.1 (in/yd) (Generic) Every Other Day/15 Days Discharge Instructions: Secure with Kerlix as directed. 1. Continue with silver alginate as a primary dressing backing drawtex 2. Still in a total contact cast on the left 3. Both wound volumes appear to be Best boy) Signed: 01/14/2021 4:38:36 PM By: Linton Ham MD Entered By: Linton Ham on 01/14/2021 11:49:07 -------------------------------------------------------------------------------- Total Contact Cast Details Patient Name: Date of Service: [name], [place] 01/14/2021 10:30 A M Medical Record Number: [id number] Patient Account Number: [id number] Date of Birth/Sex: Treating RN: May 02, 1976 (45 y.o. [name] Primary Care Provider: [name] Other Clinician: Referring Provider: Treating Provider/Extender: [name] in Treatment: 26 T Contact Cast Applied for Wound Assessment: otal Wound #2 Left Calcaneus Performed By: Physician Ricard Dillon., MD Post Procedure Diagnosis Same as Pre-procedure Electronic Signature(s) Signed: 01/14/2021 4:38:36 PM By: Linton Ham MD Entered By: Linton Ham on 01/14/2021 11:46:51 -------------------------------------------------------------------------------- SuperBill Details Patient Name: Date of Service: [name], [place]. 01/14/2021 Medical Record Number: [id number] Patient  Account Number: [id number] Date of Birth/Sex: Treating RN: [birth year] (45 y.o. [name], [name] Primary Care Provider: [name] Other Clinician: Referring Provider: Treating Provider/Extender: [name] in Treatment: 26 Diagnosis Coding ICD-10 Codes Code Description [phone] Pressure ulcer of other site, stage 3 M62.81 Muscle weakness (generalized) I10 Essential (primary) hypertension L97.512 Non-pressure chronic ulcer of other part of right foot with fat layer exposed L97.522 Non-pressure chronic ulcer of other 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 Facility Procedures CPT4 Code: [id number] 2 Description: 9445 - APPLY TOTAL CONTACT LEG CAST ICD-10 Diagnosis Description L97.522 Non-pressure chronic ulcer of other 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 Modifier: Quantity: 1 Physician Procedures : CPT4 Code Description Modifier [id number] - WC PHYS APPLY TOTAL CONTACT CAST ICD-10 Diagnosis Description L97.522 Non-pressure chronic ulcer of other 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 Quantity: 1 Electronic Signature(s) Signed: 01/14/2021 4:38:36 PM By: Linton Ham MD Entered By: Linton Ham on 01/14/2021 11:49:16

## 2021-01-15 NOTE — Progress Notes (Signed)
**Note [name]-Identified via Obfuscation** ENNIS, HEAVNER ([id number]) Visit Report for 01/14/2021 Arrival Information Details Patient Name: Date of Service: [name], [name] 01/14/2021 10:30 A M Medical Record Number: [id number] Patient Account Number: [id number] Date of Birth/Sex: Treating RN: 11-27-1975 (45 y.o. [name] Primary Care Tashi Band: [name] Other Clinician: Referring Kamylle Axelson: Treating Arwyn Besaw/Extender: [name] in Treatment: 72 Visit Information History Since Last Visit Added or deleted any medications: No Patient Arrived: Wheel Chair Any new allergies or adverse reactions: No Arrival Time: 11:13 Had a fall or experienced change in No Accompanied By: son activities of daily living that may affect Transfer Assistance: None risk of falls: Patient Identification Verified: Yes Signs or symptoms of abuse/neglect since last visito No Secondary Verification Process Completed: Yes Hospitalized since last visit: No Patient Requires Transmission-Based Precautions: No Implantable device outside of the clinic excluding No Patient Has Alerts: No cellular tissue based products placed in the center since last visit: Has Dressing in Place as Prescribed: Yes Pain Present Now: Yes Electronic Signature(s) Signed: 01/14/2021 3:14:03 PM By: Sandre Kitty Entered By: Sandre Kitty on 01/14/2021 11:13:38 -------------------------------------------------------------------------------- Encounter Discharge Information Details Patient Name: Date of Service: [name], [name] E. 01/14/2021 10:30 A M Medical Record Number: [id number] Patient Account Number: [id number] Date of Birth/Sex: Treating RN: [birth year] (45 y.o. [name] Primary Care Mallisa Alameda: [name] Other Clinician: Referring Honore Wipperfurth: Treating Annalee Meyerhoff/Extender: [name] in Treatment: 26 Encounter Discharge Information Items Discharge Condition: Stable Ambulatory Status:  Wheelchair Discharge Destination: Home Transportation: Private Auto Accompanied By: aunt and son Schedule Follow-up Appointment: Yes Clinical Summary of Care: Patient Declined Electronic Signature(s) Signed: 01/14/2021 5:15:25 PM By: Baruch Gouty RN, BSN Entered By: Baruch Gouty on 01/14/2021 14:57:34 -------------------------------------------------------------------------------- Lower Extremity Assessment Details Patient Name: Date of Service: [name], [name] 01/14/2021 10:30 A M Medical Record Number: [id number] Patient Account Number: [id number] Date of Birth/Sex: Treating RN: November 19, 1975 (45 y.o. [name] Primary Care Kapono Luhn: [name] Other Clinician: Referring Shawni Volkov: Treating Coltrane Tugwell/Extender: [name] in Treatment: 26 Edema Assessment Assessed: [name]: Yes] [name]: Yes] Edema: [Left: No] [Right: No] Calf Left: Right: Point of Measurement: 29 cm From Medial Instep 42 cm 43 cm Ankle Left: Right: Point of Measurement: 9 cm From Medial Instep 24 cm 23 cm Vascular Assessment Pulses: Dorsalis Pedis Palpable: [Left:Yes] [Right:Yes] Electronic Signature(s) Signed: 01/14/2021 6:02:04 PM By: [name] Entered By: [name] on 01/14/2021 11:38:30 -------------------------------------------------------------------------------- Multi Wound Chart Details Patient Name: Date of Service: [name], [name] 01/14/2021 10:30 A M Medical Record Number: [id number] Patient Account Number: [id number] Date of Birth/Sex: Treating RN: 13-Apr-1976 (45 y.o. [name], [name] Primary Care Yurika Pereda: [name] Other Clinician: Referring Jajaira Ruis: Treating Sevan Mcbroom/Extender: [name] in Treatment: 26 Vital Signs Height(in): 68 Pulse(bpm): 42 Weight(lbs): 280 Blood Pressure(mmHg): 153/82 Body Mass Index(BMI): 41 Temperature(F): 98.7 Respiratory Rate(breaths/min): 18 Photos: [1:No Photos Right Calcaneus] [2:No  Photos Left Calcaneus] [N/A:N/A N/A] Wound Location: [1:Pressure Injury] [2:Pressure Injury] [N/A:N/A] Wounding Event: [1:Pressure Ulcer] [2:Pressure Ulcer] [N/A:N/A] Primary Etiology: [1:Asthma, Angina, Hypertension] [2:Asthma, Angina, Hypertension] [N/A:N/A] Comorbid History: [1:10/07/2019] [2:10/07/2019] [N/A:N/A] Date Acquired: [1:26] [2:26] [N/A:N/A] Weeks of Treatment: [1:Open] [2:Open] [N/A:N/A] Wound Status: [1:3x0.8x0.4] [2:4x1.2x0.3] [N/A:N/A] Measurements L x W x D (cm) [1:1.885] [2:3.77] [N/A:N/A] A (cm) : rea [1:0.754] [2:1.131] [N/A:N/A] Volume (cm) : [1:94.00%] [2:86.10%] [N/A:N/A] % Reduction in A rea: [1:97.60%] [2:95.80%] [N/A:N/A] % Reduction in Volume: [1:Category/Stage III] [2:Category/Stage III] [N/A:N/A] Classification: [1:Medium] [2:Medium] [N/A:N/A] Exudate A  mount: [1:N/A] [2:Serosanguineous] [N/A:N/A] Exudate Type: [1:N/A] [2:red, brown] [N/A:N/A] Exudate Color: [1:Distinct, outline attached] [2:Distinct, outline attached] [N/A:N/A] Wound Margin: [1:Large (67-100%)] [2:Large (67-100%)] [N/A:N/A] Granulation A mount: [1:Red, Pink] [2:Pink] [N/A:N/A] Granulation Quality: [1:None Present (0%)] [2:None Present (0%)] [N/A:N/A] Necrotic A mount: [1:Fat Layer (Subcutaneous Tissue): Yes Fat Layer (Subcutaneous Tissue): Yes N/A] Exposed Structures: [1:Fascia: No Tendon: No Muscle: No Joint: No Bone: No Medium (34-66%)] [2:Fascia: No Tendon: No Muscle: No Joint: No Bone: No Small (1-33%)] [N/A:N/A] Epithelialization: [1:N/A] [2:T Contact Cast otal] [N/A:N/A] Treatment Notes Electronic Signature(s) Signed: 01/14/2021 4:38:36 PM By: Linton Ham MD Signed: 01/14/2021 6:02:04 PM By: [name] Entered By: Linton Ham on 01/14/2021 11:46:40 -------------------------------------------------------------------------------- Multi-Disciplinary Care Plan Details Patient Name: Date of Service: [name], [name] 01/14/2021 10:30 A M Medical Record Number:  [id number] Patient Account Number: [id number] Date of Birth/Sex: Treating RN: 1976/02/23 (45 y.o. [name] Primary Care Dorsey Authement: [name] Other Clinician: Referring Anastacia Reinecke: Treating Shailey Butterbaugh/Extender: [name] in Treatment: 39 Multidisciplinary Care Plan reviewed with physician Active Inactive Abuse / Safety / Falls / Self Care Management Nursing Diagnoses: Potential for falls Goals: Patient/caregiver will verbalize/demonstrate measures taken [name] prevent injury and/or falls Date Initiated: 07/15/2020 Target Resolution Date: 02/05/2021 Goal Status: Active Interventions: Assess fall risk on admission and as needed Assess impairment of mobility on admission and as needed per policy Notes: Electronic Signature(s) Signed: 01/14/2021 6:02:04 PM By: [name] Entered By: [name] on 01/14/2021 11:25:27 -------------------------------------------------------------------------------- Pain Assessment Details Patient Name: Date of Service: [name], [place] E. 01/14/2021 10:30 A M Medical Record Number: [id number] Patient Account Number: [id number] Date of Birth/Sex: Treating RN: 20-Oct-1975 (45 y.o. [name] Primary Care Eula Jaster: [name] Other Clinician: Referring Ameya Vowell: Treating [place] Retana/Extender: [name] in Treatment: 26 Active Problems Location of Pain Severity and Description of Pain Patient Has Paino No Site Locations Pain Management and Medication Current Pain Management: Electronic Signature(s) Signed: 01/14/2021 3:14:03 PM By: Sandre Kitty Signed: 01/14/2021 6:02:04 PM By: [name] Entered By: Sandre Kitty on 01/14/2021 11:14:06 -------------------------------------------------------------------------------- Patient/Caregiver Education Details Patient Name: Date of Service: Ciro Backer 6/9/2022andnbsp10:30 [place] Record Number: [id number] Patient Account Number:  [id number] Date of Birth/Gender: Treating RN: [birth year] (45 y.o. [name] Primary Care Physician: [name] Other Clinician: Referring Physician: Treating Physician/Extender: [name] in Treatment: 26 Education Assessment Education Provided [name]: Patient Education Topics Provided Wound/Skin Impairment: Handouts: Skin Care Do's and Dont's Methods: Explain/Verbal Responses: Reinforcements needed Electronic Signature(s) Signed: 01/14/2021 6:02:04 PM By: [name] Entered By: [name] on 01/14/2021 11:25:46 -------------------------------------------------------------------------------- Wound Assessment Details Patient Name: Date of Service: [name], Druid [name] 01/14/2021 10:30 A M Medical Record Number: [id number] Patient Account Number: [id number] Date of Birth/Sex: Treating RN: [birth year] (45 y.o. [name], [name] Primary Care Joachim Carton: [name] Other Clinician: Referring Keylen Uzelac: Treating Caige Almeda/Extender: [name] in Treatment: 26 Wound Status Wound Number: 1 Primary Etiology: Pressure Ulcer Wound Location: Right Calcaneus Wound Status: Open Wounding Event: Pressure Injury Comorbid History: Asthma, Angina, Hypertension Date Acquired: 10/07/2019 Weeks Of Treatment: 26 Clustered Wound: No Photos Wound Measurements Length: (cm) 3 Width: (cm) 0.8 Depth: (cm) 0.4 Area: (cm) 1.885 Volume: (cm) 0.754 % Reduction in Area: 94% % Reduction in Volume: 97.6% Epithelialization: Medium (34-66%) Tunneling: No Undermining: No Wound Description Classification: Category/Stage III Wound Margin: Distinct, outline attached Exudate Amount: Medium Foul Odor After Cleansing: No Slough/Fibrino No Wound Bed Granulation Amount:  Large (67-100%) Exposed Structure Granulation Quality: Red, Pink Fascia Exposed: No Necrotic Amount: None Present (0%) Fat Layer (Subcutaneous Tissue) Exposed: Yes Tendon Exposed:  No Muscle Exposed: No Joint Exposed: No Bone Exposed: No Treatment Notes Wound #1 (Calcaneus) Wound Laterality: Right Cleanser Wound Cleanser Discharge Instruction: Cleanse the wound with wound cleanser prior [name] applying a clean dressing using gauze sponges, not tissue or cotton balls. Normal Saline Discharge Instruction: Cleanse the wound with Normal Saline prior [name] applying a clean dressing using gauze sponges, not tissue or cotton balls. Peri-Wound Care Topical Primary Dressing KerraCel Ag Gelling Fiber Dressing, 2x2 in (silver alginate) Discharge Instruction: ****Ensure [name] apply into divot area of wound.**** Apply silver alginate [name] wound bed as instructed Secondary Dressing ABD Pad, 5x9 Discharge Instruction: Apply over primary dressing as directed. Secured With Elastic Bandage 4 inch (ACE bandage) Discharge Instruction: Secure with ACE bandage as directed. Kerlix Roll Sterile, 4.5x3.1 (in/yd) Discharge Instruction: Secure with Kerlix as directed. Compression Wrap Compression Stockings Add-Ons Electronic Signature(s) Signed: 01/14/2021 6:02:04 PM By: [name] Signed: 01/15/2021 3:50:24 PM By: Sandre Kitty Entered By: Sandre Kitty on 01/14/2021 16:48:44 -------------------------------------------------------------------------------- Wound Assessment Details Patient Name: Date of Service: [name], [place] 01/14/2021 10:30 A M Medical Record Number: [id number] Patient Account Number: [id number] Date of Birth/Sex: Treating RN: Apr 30, 1976 (45 y.o. [name], [name] Primary Care Hollee Fate: [name] Other Clinician: Referring Saralee Bolick: Treating Aliyanah Rozas/Extender: [name] in Treatment: 26 Wound Status Wound Number: 2 Primary Etiology: Pressure Ulcer Wound Location: Left Calcaneus Wound Status: Open Wounding Event: Pressure Injury Comorbid History: Asthma, Angina, Hypertension Date Acquired: 10/07/2019 Weeks Of Treatment:  26 Clustered Wound: No Photos Wound Measurements Length: (cm) 4 Width: (cm) 1.2 Depth: (cm) 0.3 Area: (cm) 3.77 Volume: (cm) 1.131 % Reduction in Area: 86.1% % Reduction in Volume: 95.8% Epithelialization: Small (1-33%) Tunneling: No Undermining: No Wound Description Classification: Category/Stage III Wound Margin: Distinct, outline attached Exudate Amount: Medium Exudate Type: Serosanguineous Exudate Color: red, brown Foul Odor After Cleansing: No Slough/Fibrino No Wound Bed Granulation Amount: Large (67-100%) Exposed Structure Granulation Quality: Pink Fascia Exposed: No Necrotic Amount: None Present (0%) Fat Layer (Subcutaneous Tissue) Exposed: Yes Tendon Exposed: No Muscle Exposed: No Joint Exposed: No Bone Exposed: No Treatment Notes Wound #2 (Calcaneus) Wound Laterality: Left Cleanser Wound Cleanser Discharge Instruction: Cleanse the wound with wound cleanser prior [name] applying a clean dressing using gauze sponges, not tissue or cotton balls. Normal Saline Discharge Instruction: Cleanse the wound with Normal Saline prior [name] applying a clean dressing using gauze sponges, not tissue or cotton balls. Peri-Wound Care Zinc Oxide Ointment 30g tube Discharge Instruction: ****Apply Zinc Oxide [name] periwound with each dressing change**** Topical Primary Dressing KerraCel Ag Gelling Fiber Dressing, 2x2 in (silver alginate) Discharge Instruction: Apply silver alginate [name] wound bed as instructed Secondary Dressing Woven Gauze Sponge, Non-Sterile 4x4 in Discharge Instruction: Apply over primary dressing as directed. Zetuvit Plus 4x8 in Discharge Instruction: Apply over primary dressing as directed. ABD Pad, 8x10 Discharge Instruction: Apply over primary dressing as directed. Secured With [employer], 4.5x3.1 (in/yd) Discharge Instruction: Secure with Kerlix as directed. Compression Wrap Compression Stockings Add-Ons Electronic Signature(s) Signed: 01/14/2021  6:02:04 PM By: [name] Signed: 01/15/2021 3:50:24 PM By: Sandre Kitty Entered By: Sandre Kitty on 01/14/2021 16:49:06 -------------------------------------------------------------------------------- Vitals Details Patient Name: Date of Service: [name], [place] 01/14/2021 10:30 A M Medical Record Number: [id number] Patient Account Number: [id number] Date of Birth/Sex: Treating RN: 1976/01/16 (45 y.o. [name], [name] Primary  Care Helaina Stefano: [name] Other Clinician: Referring Loney Peto: Treating Belle Charlie/Extender: [name] in Treatment: 26 Vital Signs Time Taken: 11:13 Temperature (F): 98.7 Height (in): 69 Pulse (bpm): 82 Weight (lbs): 280 Respiratory Rate (breaths/min): 18 Body Mass Index (BMI): 41.3 Blood Pressure (mmHg): 153/82 Reference Range: 80 - 120 mg / dl Electronic Signature(s) Signed: 01/14/2021 3:14:03 PM By: Sandre Kitty Entered By: Sandre Kitty on 01/14/2021 11:13:57

## 2021-01-19 DIAGNOSIS — Z0271 Encounter for disability determination: Secondary | ICD-10-CM

## 2021-01-21 ENCOUNTER — Other Ambulatory Visit: Payer: Self-pay

## 2021-01-21 ENCOUNTER — Encounter (HOSPITAL_BASED_OUTPATIENT_CLINIC_OR_DEPARTMENT_OTHER): Payer: BC Managed Care – PPO | Admitting: Internal Medicine

## 2021-01-21 ENCOUNTER — Telehealth: Payer: Self-pay | Admitting: *Deleted

## 2021-01-21 DIAGNOSIS — L89613 Pressure ulcer of right heel, stage 3: Secondary | ICD-10-CM | POA: Diagnosis not present

## 2021-01-21 DIAGNOSIS — L89620 Pressure ulcer of left heel, unstageable: Secondary | ICD-10-CM | POA: Diagnosis not present

## 2021-01-21 DIAGNOSIS — L89893 Pressure ulcer of other site, stage 3: Secondary | ICD-10-CM | POA: Diagnosis not present

## 2021-01-21 DIAGNOSIS — L97512 Non-pressure chronic ulcer of other part of right foot with fat layer exposed: Secondary | ICD-10-CM | POA: Diagnosis not present

## 2021-01-21 DIAGNOSIS — L97522 Non-pressure chronic ulcer of other 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: Secondary | ICD-10-CM | POA: Diagnosis not present

## 2021-01-21 NOTE — Telephone Encounter (Signed)
Swab was negative for any medication.  Per Dr Florentina Jenny note he has not taken his medication in a week or more before the test was done. This would be expected,

## 2021-01-22 NOTE — Progress Notes (Signed)
[name], POSS ([id number]) Visit Report for 01/21/2021 Debridement Details Patient Name: Date of Service: [name], [place] 01/21/2021 10:30 A M Medical Record Number: [id number] Patient Account Number: [id number] Date of Birth/Sex: Treating RN: [birth year] (45 y.o. [name], [name] Primary Care Provider: [name] Other Clinician: Referring Provider: Treating Provider/Extender: [name] in Treatment: 27 Debridement Performed for Assessment: Wound #1 Right Calcaneus Performed By: Physician Ricard Dillon., MD Debridement Type: Debridement Level of Consciousness (Pre-procedure): Awake and Alert Pre-procedure Verification/Time Out Yes - 11:35 Taken: Start Time: 11:36 Pain Control: Lidocaine 4% T opical Solution T Area Debrided (L x W): otal 1.5 (cm) x 0.5 (cm) = 0.75 (cm) Tissue and other material debrided: Non-Viable, Callus Level: Non-Viable Tissue Debridement Description: Selective/Open Wound Instrument: Curette Bleeding: Minimum Hemostasis Achieved: Pressure End Time: 11:41 Procedural Pain: 0 Post Procedural Pain: 0 Response to Treatment: Procedure was tolerated well Level of Consciousness (Post- Awake and Alert procedure): Post Debridement Measurements of Total Wound Length: (cm) 3.6 Stage: Category/Stage III Width: (cm) 1 Depth: (cm) 0.3 Volume: (cm) 0.848 Character of Wound/Ulcer Post Debridement: Stable Post Procedure Diagnosis Same as Pre-procedure Electronic Signature(s) Signed: 01/21/2021 5:31:56 PM By: [name] Signed: 01/22/2021 10:33:35 AM By: Linton Ham MD Entered By: Linton Ham on 01/21/2021 12:37:01 -------------------------------------------------------------------------------- HPI Details Patient Name: Date of Service: [name], [place]. 01/21/2021 10:30 A M Medical Record Number: [id number] Patient Account Number: [id number] Date of Birth/Sex: Treating RN: 05/08/1976 (44 y.o. [name] Primary Care  Provider: [name] Other Clinician: Referring Provider: Treating Provider/Extender: [name] in Treatment: 27 History of Present Illness HPI Description: 07/15/2020 upon evaluation today patient presents for initial inspection here in our clinic concerning issues he has been having with the bottoms of his feet bilaterally. He states these actually occurred as wounds when he was hospitalized for 5 months secondary to Covid. He was apparently with tilting bed where he was in an upright position quite frequently and apparently this occurred in some way shape or form during that time. Fortunately there is no sign of active infection at this time. No fevers, chills, nausea, vomiting, or diarrhea. With that being said he still has substantial wounds on the plantar aspects of his feet Theragen require quite a bit of work to get these to heal. He has been using Santyl currently though that is been problematic both in receiving the medication as well as actually paid for it as it is become quite expensive. Prior to the experience with Covid the patient really did not have any major medical problems other than hypertension he does have some mild generalized weakness following the Covid experience. 07/22/2020 on evaluation today patient appears to be doing okay in regard to his foot ulcers I feel like the wound beds are showing signs of better improvement that I do believe the Iodoflex is helping in this regard. With that being said he does have a lot of drainage currently and this is somewhat blue/green in nature which is consistent with Pseudomonas. I do think a culture today would be appropriate for [place] to evaluate and see if that is indeed the case I would likely start him on antibiotic orally as well he is not allergic to Cipro knows of no issues he has had in the past 12/21; patient was admitted to the clinic earlier this month with bilateral presumed pressure ulcers on the  bottom of his feet apparently related to excessive pressure from a tilt  table arrangement in the intensive care unit. Patient relates this to being on ECMO but I am not really sure that is exactly related to that. I must say I have never seen anything like this. He has fairly extensive full-thickness wounds extending from his heel towards his midfoot mostly centered laterally. There is already been some healing distally. He does not appear to have an arterial issue. He has been using gentamicin to the wound surfaces with Iodoflex to help with ongoing debridement 1/6; this is a patient with pressure ulcers on the bottom of his feet related to excessive pressure from a standing position in the intensive care unit. He is complaining of a lot of pain in the right heel. He is not a diabetic. 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 We have been using Iodoflex to help prepare the surfaces of both wounds for an advanced treatment product. He is nonambulatory spending most of his time in a wheelchair I have asked him not to propel the wheelchair with his heels 1/13; in general his wounds look better not much surface area change we have been using Iodoflex as of last week. I did an x-ray of the right heel as the patient was complaining of pain. I had some thoughts about a stress fracture perhaps Achilles tendon problems however what it showed was erosive changes along the inferior aspect of the calcaneus he now has a MRI booked for 1/20. 1/20; in general his wounds continue to be better. Some improvement in the large narrow areas proximally in his foot. He is still complaining of pain in the right heel and tenderness in certain areas of this wound. His MRI is tonight. I am not just looking for osteomyelitis that was brought up on the x-ray I am wondering about stress fractures, tendon ruptures etc. He has no such findings on the left. Also noteworthy is that the patient had critical  illness neuropathy and some of the discomfort may be actual improvement in nerve function I am just not sure. These wounds were initially in the setting of severe critical illness related to COVID-19. He was put in a standing position. He may have also been on pressors at the point contributing to tissue ischemia. By his description at some point these wounds were grossly necrotic extending proximally up into the Achilles part of his heel. I do not know that I have ever really seen pictures of them like this although they may exist in epic We have ordered Tri layer Oasis. I am trying to stimulate some granulation in these areas. This is of course assuming the MRI is negative for infection 1/27; since the patient was last here he saw Dr. Juleen China of infectious disease. He is planned for vancomycin and ceftriaxone. Prior operative culture grew MSSA. Also ordered baseline lab work. He also ordered arterial studies although the ABIs in our clinic were normal as well as his clinical exam these were normal I do not think he needs to see vascular surgery. His ABIs at the P[facility] were 1.22 in the right triphasic waveforms with a normal TBI of 1.15 on the left ABI of 1.22 with triphasic waveforms and a normal TBI of 1.08. Finally he saw Dr. Amalia Hailey who will follow him in for 2 months. At this point I do not think he felt that he needed a procedure on the right calcaneal bone. Dr. Juleen China is elected for broad-spectrum antibiotic The patient is still having pain in the right heel. He  walks with a walker 2/3; wounds are generally smaller. He is tolerating his IV antibiotics. I believe this is vancomycin and ceftriaxone. We are still waiting for Oasis burn in terms of his out-of-pocket max which he should be meeting soon given the IV antibiotics, MRIs etc. I have asked him to check in on this. We are using silver collagen in the meantime the wounds look better 2/10; tolerating IV vancomycin and Rocephin. We are waiting  to apply for Oasis. Although I am not really sure where he is in his out-of-pocket max. 2/17 started the first application of Oasis trilayer. Still on antibiotics. The wounds have generally look better. The area on the left has a little more surface slough requiring debridement 2/40; second application of Oasis trilayer. The wound surface granulation is generally look better. The area on the left with undermining laterally I think is come in a bit. 10/08/2020 upon evaluation today patient is here today for [employer] application #3. Fortunately he seems to be doing extremely well with regard to this and we are seeing a lot of new epithelial growth which is great news. Fortunately there is no signs of active infection at this time. 10/16/2020 upon evaluation today patient appears to be doing well with regard to his foot ulcers. Do believe the Oasis has been of benefit for him. I do not see any signs of infection right now which is great news and I think that he has a lot of new epithelial growth which is great to see as well. The patient is very pleased to hear all of this. I do think we can proceed with the Oasis trilayer #4 today. 3/18; not as much improvement in these areas on his heels that I was hoping. I did reapply trilateral Oasis today the tissue looks healthier but not as much fill in as I was hoping. 3/25; better looking today I think this is come in a bit the tissue looks healthier. Triple layer Oasis reapplied #6 4/1; somewhat better looking definitely better looking surface not as much change in surface area as I was hoping. He may be spending more time [place] on days then he needs to although he does have heel offloading boots. Triple layer Oasis reapplied #7 4/7; unfortunately apparently [facility] will not approve any further Oasis which is unfortunate since the patient did respond nicely both in terms of the condition of the wound bed as well as surface area. There is  still some drainage coming from the wound but not a lot there does not appear to be any infection 4/15; we have been using Hydrofera Blue. He continues to have nice rims of epithelialization on the right greater than the left. The left the epithelialization is coming from the tip of his heel. There is moderate drainage. In this that concerns me about a total contact cast. There is no evidence of infection 4/29; patient has been using Hydrofera Blue with dressing changes. He has no complaints or issues today. 5/5; using Hydrofera Blue. I actually think that he looks marginally better than the last time I saw this 3 weeks ago. There are rims of epithelialization on the left thumb coming from the medial side on the right. Using Hydrofera Blue 5/12; using Hydrofera Blue. These continue to make improvements in surface area. His drainage was not listed as severe I therefore went ahead and put a cast on the left foot. Right foot we will continue to dress his previous 5/16; back for  first total contact cast change. He did not tolerate this particularly well cast injury on the anterior tibia among other issues. Difficulty sleeping. I talked him about this in some detail and afterwards is elected to continue. I told him I would like to have a cast on for 3 weeks to see if this is going to help at all. I think he agreed 5/19; I think the wound is better. There is no tunneling towards his midfoot. The undermining medially also looks better. He has a rim of new skin distally. I think we are making progress here. The area on the left also continues to look somewhat better to me using Hydrofera Blue. He has a list of complaints about the cast but none of them seem serious 5/26; patient presents for 1 week follow-up. He has been using a total contact cast and tolerating this well. Hydrofera Blue is the main dressing used. He denies signs of infection. 6/2 Hydrofera Blue total contact cast on the left. These were  large ulcers that formed in intensive care unit where the patient was recovering from [place]. May have had something to do with being ventilated in an upright positiono Pressors etc. We have been able to get the areas down considerably and a viable surface. There is some epithelialization in both sides. Note made of drainage 6/9; changed to [facility] last time because of drainage. He arrives with better looking surfaces and dimensions on the left than the right. Paradoxically the right actually probes more towards his midfoot the left is largely close down but both of these look improved. Using a total contact cast on the left 6/16; complex wounds on his bilateral plantar heels which were initially pressure injury from a stay in the ICU with COVID. We have been using silver alginate most recently. His dimensions of come in quite dramatically however not recently. We have been putting the left foot in a total contact cast Electronic Signature(s) Signed: 01/22/2021 10:33:35 AM By: Linton Ham MD Entered By: Linton Ham on 01/21/2021 12:37:52 -------------------------------------------------------------------------------- Physical Exam Details Patient Name: Date of Service: [name], [place] 01/21/2021 10:30 A M Medical Record Number: [id number] Patient Account Number: [id number] Date of Birth/Sex: Treating RN: [birth year] (45 y.o. [name] Primary Care Provider: [name] Other Clinician: Referring Provider: Treating Provider/Extender: [name] in Treatment: 27 Constitutional Patient is hypertensive.. Pulse regular and within target range for patient.Marland Kitchen Respirations regular, non-labored and within target range.. Temperature is normal and within the target range for the patient.Marland Kitchen Appears in no distress. Notes Wound exam; Bilateral heel wounds. Surface area of these is probably about the same. On the right heel he had thick adherent callus distally I  been watching this for the last 2 weeks or so. I remove this today unfortunately not all of this under this is epithelialized. Hemostasis with direct pressure. There is no evidence of surrounding infection Electronic Signature(s) Signed: 01/22/2021 10:33:35 AM By: Linton Ham MD Entered By: Linton Ham on 01/21/2021 12:39:05 -------------------------------------------------------------------------------- Physician Orders Details Patient Name: Date of Service: [name], [place] 01/21/2021 10:30 A M Medical Record Number: [id number] Patient Account Number: [id number] Date of Birth/Sex: Treating RN: Jan 31, 1976 (45 y.o. [name] Primary Care Provider: [name] Other Clinician: Referring Provider: Treating Provider/Extender: [name] in Treatment: 27 Verbal / Phone Orders: No Diagnosis Coding Follow-up Appointments ppointment in 1 week. - Dr. Dellia Nims Thursday for [facility] appt. ****60 minutes for cast changes**** Return  A Bathing/ Shower/ Hygiene May shower with protection but do not get wound dressing(s) wet. Edema Control - Lymphedema / SCD / Other Bilateral Lower Extremities Elevate legs to the level of the heart or above for 30 minutes daily and/or when sitting, a frequency of: Avoid standing for long periods of time. Moisturize legs daily. - right leg and foot every night. Off-Loading Total Contact Cast to Left Lower Extremity Wedge shoe to: - Glophed Shoe to right foot Other: - keep pressure off of the bottom of your feet Additional Orders / Instructions Follow Nutritious Diet Wound Treatment Wound #1 - Calcaneus Wound Laterality: Right Cleanser: Normal Saline (Generic) Every Other Day/15 Days Discharge Instructions: Cleanse the wound with Normal Saline prior to applying a clean dressing using gauze sponges, not tissue or cotton balls. Cleanser: Wound Cleanser Every Other Day/15 Days Discharge Instructions: Cleanse the wound with wound  cleanser prior to applying a clean dressing using gauze sponges, not tissue or cotton balls. Prim Dressing: KerraCel Ag Gelling Fiber Dressing, 2x2 in (silver alginate) (Generic) Every Other Day/15 Days ary Discharge Instructions: ****Ensure to apply into divot area of wound.**** Apply silver alginate to wound bed as instructed Secondary Dressing: ABD Pad, 5x9 Every Other Day/15 Days Discharge Instructions: Apply over primary dressing as directed. Secured With: Elastic Bandage 4 inch (ACE bandage) Every Other Day/15 Days Discharge Instructions: Secure with ACE bandage as directed. Secured With: [employer], 4.5x3.1 (in/yd) (Generic) Every Other Day/15 Days Discharge Instructions: Secure with Kerlix as directed. Wound #2 - Calcaneus Wound Laterality: Left Cleanser: Normal Saline (Generic) Every Other Day/15 Days Discharge Instructions: Cleanse the wound with Normal Saline prior to applying a clean dressing using gauze sponges, not tissue or cotton balls. Cleanser: Wound Cleanser Every Other Day/15 Days Discharge Instructions: Cleanse the wound with wound cleanser prior to applying a clean dressing using gauze sponges, not tissue or cotton balls. Peri-Wound Care: Zinc Oxide Ointment 30g tube Every Other Day/15 Days Discharge Instructions: ****Apply Zinc Oxide to periwound with each dressing change**** Prim Dressing: KerraCel Ag Gelling Fiber Dressing, 2x2 in (silver alginate) Every Other Day/15 Days ary Discharge Instructions: Apply silver alginate to wound bed as instructed Secondary Dressing: Woven Gauze Sponge, Non-Sterile 4x4 in (Generic) Every Other Day/15 Days Discharge Instructions: Apply over primary dressing as directed. Secondary Dressing: ABD Pad, 8x10 (Generic) Every Other Day/15 Days Discharge Instructions: Apply over primary dressing as directed. Secondary Dressing: Zetuvit Plus 4x8 in (Generic) Every Other Day/15 Days Discharge Instructions: Apply over primary dressing  as directed. Secured With: [employer], 4.5x3.1 (in/yd) (Generic) Every Other Day/15 Days Discharge Instructions: Secure with Kerlix as directed. Electronic Signature(s) Signed: 01/21/2021 5:31:56 PM By: [name] Signed: 01/22/2021 10:33:35 AM By: Linton Ham MD Entered By: [name] on 01/21/2021 11:42:41 -------------------------------------------------------------------------------- Problem List Details Patient Name: Date of Service: [name], [place] 01/21/2021 10:30 A M Medical Record Number: [id number] Patient Account Number: [id number] Date of Birth/Sex: Treating RN: [birth year] (45 y.o. [name] Primary Care Provider: [name] Other Clinician: Referring Provider: Treating Provider/Extender: [name] in Treatment: 27 Active Problems ICD-10 Encounter Code Description Active Date MDM Diagnosis [phone] Pressure ulcer of other site, stage 3 07/15/2020 No Yes L97.512 Non-pressure chronic ulcer of other part of right foot with fat layer exposed 09/03/2020 No Yes L97.522 Non-pressure chronic ulcer of other 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 09/03/2020 No Yes Inactive Problems ICD-10 Code Description Active Date Inactive Date M86.171 Other acute osteomyelitis, right ankle  and foot 09/03/2020 09/03/2020 M62.81 Muscle weakness (generalized) 07/15/2020 07/15/2020 I10 Essential (primary) hypertension 07/15/2020 07/15/2020 Resolved Problems Electronic Signature(s) Signed: 01/22/2021 10:33:35 AM By: Linton Ham MD Entered By: Linton Ham on 01/21/2021 12:36:29 -------------------------------------------------------------------------------- Progress Note Details Patient Name: Date of Service: [name], [place] 01/21/2021 10:30 A M Medical Record Number: [id number] Patient Account Number: [id number] Date of Birth/Sex: Treating RN: 1976/08/01 (45 y.o. [name] Primary Care Provider: [name] Other  Clinician: Referring Provider: Treating Provider/Extender: [name] in Treatment: 27 Subjective History of Present Illness (HPI) 07/15/2020 upon evaluation today patient presents for initial inspection here in our clinic concerning issues he has been having with the bottoms of his feet bilaterally. He states these actually occurred as wounds when he was hospitalized for 5 months secondary to Covid. He was apparently with tilting bed where he was in an upright position quite frequently and apparently this occurred in some way shape or form during that time. Fortunately there is no sign of active infection at this time. No fevers, chills, nausea, vomiting, or diarrhea. With that being said he still has substantial wounds on the plantar aspects of his feet Theragen require quite a bit of work to get these to heal. He has been using Santyl currently though that is been problematic both in receiving the medication as well as actually paid for it as it is become quite expensive. Prior to the experience with Covid the patient really did not have any major medical problems other than hypertension he does have some mild generalized weakness following the Covid experience. 07/22/2020 on evaluation today patient appears to be doing okay in regard to his foot ulcers I feel like the wound beds are showing signs of better improvement that I do believe the Iodoflex is helping in this regard. With that being said he does have a lot of drainage currently and this is somewhat blue/green in nature which is consistent with Pseudomonas. I do think a culture today would be appropriate for [place] to evaluate and see if that is indeed the case I would likely start him on antibiotic orally as well he is not allergic to Cipro knows of no issues he has had in the past 12/21; patient was admitted to the clinic earlier this month with bilateral presumed pressure ulcers on the bottom of his feet apparently  related to excessive pressure from a tilt table arrangement in the intensive care unit. Patient relates this to being on ECMO but I am not really sure that is exactly related to that. I must say I have never seen anything like this. He has fairly extensive full-thickness wounds extending from his heel towards his midfoot mostly centered laterally. There is already been some healing distally. He does not appear to have an arterial issue. He has been using gentamicin to the wound surfaces with Iodoflex to help with ongoing debridement 1/6; this is a patient with pressure ulcers on the bottom of his feet related to excessive pressure from a standing position in the intensive care unit. He is complaining of a lot of pain in the right heel. He is not a diabetic. 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 We have been using Iodoflex to help prepare the surfaces of both wounds for an advanced treatment product. He is nonambulatory spending most of his time in a wheelchair I have asked him not to propel the wheelchair with his heels 1/13; in general his wounds look better  not much surface area change we have been using Iodoflex as of last week. I did an x-ray of the right heel as the patient was complaining of pain. I had some thoughts about a stress fracture perhaps Achilles tendon problems however what it showed was erosive changes along the inferior aspect of the calcaneus he now has a MRI booked for 1/20. 1/20; in general his wounds continue to be better. Some improvement in the large narrow areas proximally in his foot. He is still complaining of pain in the right heel and tenderness in certain areas of this wound. His MRI is tonight. I am not just looking for osteomyelitis that was brought up on the x-ray I am wondering about stress fractures, tendon ruptures etc. He has no such findings on the left. Also noteworthy is that the patient had critical illness neuropathy and some  of the discomfort may be actual improvement in nerve function I am just not sure. These wounds were initially in the setting of severe critical illness related to COVID-19. He was put in a standing position. He may have also been on pressors at the point contributing to tissue ischemia. By his description at some point these wounds were grossly necrotic extending proximally up into the Achilles part of his heel. I do not know that I have ever really seen pictures of them like this although they may exist in epic We have ordered Tri layer Oasis. I am trying to stimulate some granulation in these areas. This is of course assuming the MRI is negative for infection 1/27; since the patient was last here he saw Dr. Juleen China of infectious disease. He is planned for vancomycin and ceftriaxone. Prior operative culture grew MSSA. Also ordered baseline lab work. He also ordered arterial studies although the ABIs in our clinic were normal as well as his clinical exam these were normal I do not think he needs to see vascular surgery. His ABIs at the [facility] were 1.22 in the right triphasic waveforms with a normal TBI of 1.15 on the left ABI of 1.22 with triphasic waveforms and a normal TBI of 1.08. Finally he saw Dr. Amalia Hailey who will follow him in for 2 months. At this point I do not think he felt that he needed a procedure on the right calcaneal bone. Dr. Juleen China is elected for broad-spectrum antibiotic The patient is still having pain in the right heel. He walks with a walker 2/3; wounds are generally smaller. He is tolerating his IV antibiotics. I believe this is vancomycin and ceftriaxone. We are still waiting for Oasis burn in terms of his out-of-pocket max which he should be meeting soon given the IV antibiotics, MRIs etc. I have asked him to check in on this. We are using silver collagen in the meantime the wounds look better 2/10; tolerating IV vancomycin and Rocephin. We are waiting to apply for Oasis.  Although I am not really sure where he is in his out-of-pocket max. 2/17 started the first application of Oasis trilayer. Still on antibiotics. The wounds have generally look better. The area on the left has a little more surface slough requiring debridement 1/60; second application of Oasis trilayer. The wound surface granulation is generally look better. The area on the left with undermining laterally I think is come in a bit. 10/08/2020 upon evaluation today patient is here today for [employer] application #3. Fortunately he seems to be doing extremely well with regard to this and we are seeing a lot of  new epithelial growth which is great news. Fortunately there is no signs of active infection at this time. 10/16/2020 upon evaluation today patient appears to be doing well with regard to his foot ulcers. Do believe the Oasis has been of benefit for him. I do not see any signs of infection right now which is great news and I think that he has a lot of new epithelial growth which is great to see as well. The patient is very pleased to hear all of this. I do think we can proceed with the Oasis trilayer #4 today. 3/18; not as much improvement in these areas on his heels that I was hoping. I did reapply trilateral Oasis today the tissue looks healthier but not as much fill in as I was hoping. 3/25; better looking today I think this is come in a bit the tissue looks healthier. Triple layer Oasis reapplied #6 4/1; somewhat better looking definitely better looking surface not as much change in surface area as I was hoping. He may be spending more time [place] on days then he needs to although he does have heel offloading boots. Triple layer Oasis reapplied #7 4/7; unfortunately apparently [facility] will not approve any further Oasis which is unfortunate since the patient did respond nicely both in terms of the condition of the wound bed as well as surface area. There is still some drainage  coming from the wound but not a lot there does not appear to be any infection 4/15; we have been using Hydrofera Blue. He continues to have nice rims of epithelialization on the right greater than the left. The left the epithelialization is coming from the tip of his heel. There is moderate drainage. In this that concerns me about a total contact cast. There is no evidence of infection 4/29; patient has been using Hydrofera Blue with dressing changes. He has no complaints or issues today. 5/5; using Hydrofera Blue. I actually think that he looks marginally better than the last time I saw this 3 weeks ago. There are rims of epithelialization on the left thumb coming from the medial side on the right. Using Hydrofera Blue 5/12; using Hydrofera Blue. These continue to make improvements in surface area. His drainage was not listed as severe I therefore went ahead and put a cast on the left foot. Right foot we will continue to dress his previous 5/16; back for first total contact cast change. He did not tolerate this particularly well cast injury on the anterior tibia among other issues. Difficulty sleeping. I talked him about this in some detail and afterwards is elected to continue. I told him I would like to have a cast on for 3 weeks to see if this is going to help at all. I think he agreed 5/19; I think the wound is better. There is no tunneling towards his midfoot. The undermining medially also looks better. He has a rim of new skin distally. I think we are making progress here. The area on the left also continues to look somewhat better to me using Hydrofera Blue. He has a list of complaints about the cast but none of them seem serious 5/26; patient presents for 1 week follow-up. He has been using a total contact cast and tolerating this well. Hydrofera Blue is the main dressing used. He denies signs of infection. 6/2 Hydrofera Blue total contact cast on the left. These were large ulcers that  formed in intensive care unit where the patient was  recovering from [place]. May have had something to do with being ventilated in an upright positiono Pressors etc. We have been able to get the areas down considerably and a viable surface. There is some epithelialization in both sides. Note made of drainage 6/9; changed to [facility] last time because of drainage. He arrives with better looking surfaces and dimensions on the left than the right. Paradoxically the right actually probes more towards his midfoot the left is largely close down but both of these look improved. Using a total contact cast on the left 6/16; complex wounds on his bilateral plantar heels which were initially pressure injury from a stay in the ICU with COVID. We have been using silver alginate most recently. His dimensions of come in quite dramatically however not recently. We have been putting the left foot in a total contact cast Objective Constitutional Patient is hypertensive.. Pulse regular and within target range for patient.Marland Kitchen Respirations regular, non-labored and within target range.. Temperature is normal and within the target range for the patient.Marland Kitchen Appears in no distress. Vitals Time Taken: 10:58 AM, Height: 69 in, Weight: 280 lbs, BMI: 41.3, Temperature: 98.7 F, Pulse: 84 bpm, Respiratory Rate: 18 breaths/min, Blood Pressure: 145/83 mmHg. General Notes: Wound exam; Bilateral heel wounds. Surface area of these is probably about the same. On the right heel he had thick adherent callus distally I been watching this for the last 2 weeks or so. I remove this today unfortunately not all of this under this is epithelialized. Hemostasis with direct pressure. There is no evidence of surrounding infection Integumentary (Hair, Skin) Wound #1 status is Open. Original cause of wound was Pressure Injury. The date acquired was: 10/07/2019. The wound has been in treatment 27 weeks. The wound is located on the Right Calcaneus.  The wound measures 3.2cm length x 1cm width x 0.3cm depth; 2.513cm^2 area and 0.754cm^3 volume. There is Fat Layer (Subcutaneous Tissue) exposed. There is no tunneling or undermining noted. There is a medium amount of drainage noted. The wound margin is distinct with the outline attached to the wound base. There is large (67-100%) red, pink granulation within the wound bed. There is a small (1-33%) amount of necrotic tissue within the wound bed including Eschar. Wound #2 status is Open. Original cause of wound was Pressure Injury. The date acquired was: 10/07/2019. The wound has been in treatment 27 weeks. The wound is located on the Left Calcaneus. The wound measures 4.4cm length x 1.5cm width x 0.3cm depth; 5.184cm^2 area and 1.555cm^3 volume. There is Fat Layer (Subcutaneous Tissue) exposed. There is no tunneling or undermining noted. There is a medium amount of serosanguineous drainage noted. The wound margin is distinct with the outline attached to the wound base. There is large (67-100%) pink granulation within the wound bed. There is no necrotic tissue within the wound bed. Assessment Active Problems ICD-10 Pressure ulcer of other site, stage 3 Non-pressure chronic ulcer of other part of right foot with fat layer exposed Non-pressure chronic ulcer of other 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 Procedures Wound #1 Pre-procedure diagnosis of Wound #1 is a Pressure Ulcer located on the Right Calcaneus . There was a Selective/Open Wound Non-Viable Tissue Debridement with a total area of 0.75 sq cm performed by Ricard Dillon., MD. With the following instrument(s): Curette to remove Non-Viable tissue/material. Material removed includes Callus after achieving pain control using Lidocaine 4% Topical Solution. A time out was conducted at 11:35, prior to the start of the  procedure. A Minimum amount of bleeding was controlled with Pressure. The procedure was tolerated well with a pain level of 0  throughout and a pain level of 0 following the procedure. Post Debridement Measurements: 3.6cm length x 1cm width x 0.3cm depth; 0.848cm^3 volume. Post debridement Stage noted as Category/Stage III. Character of Wound/Ulcer Post Debridement is stable. Post procedure Diagnosis Wound #1: Same as Pre-Procedure Wound #2 Pre-procedure diagnosis of Wound #2 is a Pressure Ulcer located on the Left Calcaneus . There was a T Contact Cast Procedure by Deno Etienne MD. Post procedure Diagnosis Wound #2: Same as Pre-Procedure Plan Follow-up Appointments: Return Appointment in 1 week. - Dr. Dellia Nims Thursday for [facility] appt. ****60 minutes for cast changes**** Bathing/ Shower/ Hygiene: May shower with protection but do not get wound dressing(s) wet. Edema Control - Lymphedema / SCD / Other: Elevate legs to the level of the heart or above for 30 minutes daily and/or when sitting, a frequency of: Avoid standing for long periods of time. Moisturize legs daily. - right leg and foot every night. Off-Loading: T Contact Cast to Left Lower Extremity otal Wedge shoe to: - Glophed Shoe to right foot Other: - keep pressure off of the bottom of your feet Additional Orders / Instructions: Follow Nutritious Diet WOUND #1: - Calcaneus Wound Laterality: Right Cleanser: Normal Saline (Generic) Every Other Day/15 Days Discharge Instructions: Cleanse the wound with Normal Saline prior to applying a clean dressing using gauze sponges, not tissue or cotton balls. Cleanser: Wound Cleanser Every Other Day/15 Days Discharge Instructions: Cleanse the wound with wound cleanser prior to applying a clean dressing using gauze sponges, not tissue or cotton balls. Prim Dressing: KerraCel Ag Gelling Fiber Dressing, 2x2 in (silver alginate) (Generic) Every Other Day/15 Days ary Discharge Instructions: ****Ensure to apply into divot area of wound.**** Apply silver alginate to wound bed as instructed Secondary Dressing:  ABD Pad, 5x9 Every Other Day/15 Days Discharge Instructions: Apply over primary dressing as directed. Secured With: Elastic Bandage 4 inch (ACE bandage) Every Other Day/15 Days Discharge Instructions: Secure with ACE bandage as directed. Secured With: [employer], 4.5x3.1 (in/yd) (Generic) Every Other Day/15 Days Discharge Instructions: Secure with Kerlix as directed. WOUND #2: - Calcaneus Wound Laterality: Left Cleanser: Normal Saline (Generic) Every Other Day/15 Days Discharge Instructions: Cleanse the wound with Normal Saline prior to applying a clean dressing using gauze sponges, not tissue or cotton balls. Cleanser: Wound Cleanser Every Other Day/15 Days Discharge Instructions: Cleanse the wound with wound cleanser prior to applying a clean dressing using gauze sponges, not tissue or cotton balls. Peri-Wound Care: Zinc Oxide Ointment 30g tube Every Other Day/15 Days Discharge Instructions: ****Apply Zinc Oxide to periwound with each dressing change**** Prim Dressing: KerraCel Ag Gelling Fiber Dressing, 2x2 in (silver alginate) Every Other Day/15 Days ary Discharge Instructions: Apply silver alginate to wound bed as instructed Secondary Dressing: Woven Gauze Sponge, Non-Sterile 4x4 in (Generic) Every Other Day/15 Days Discharge Instructions: Apply over primary dressing as directed. Secondary Dressing: ABD Pad, 8x10 (Generic) Every Other Day/15 Days Discharge Instructions: Apply over primary dressing as directed. Secondary Dressing: Zetuvit Plus 4x8 in (Generic) Every Other Day/15 Days Discharge Instructions: Apply over primary dressing as directed. Secured With: [employer], 4.5x3.1 (in/yd) (Generic) Every Other Day/15 Days Discharge Instructions: Secure with Kerlix as directed. #1 I am continuing with the silver alginate 2. We have made some improvements but not measurably in the last 2 or 3 weeks. I am using silver alginate because of  drainage. Still using the total  contact cast on the left. #3 unfortunately the right heel required debridement today thick callus-like material from the distal tip of the heel. I been watching this for the last 2 weeks. Unfortunately this was not totally epithelialized underneath this. Electronic Signature(s) Signed: 01/22/2021 10:33:35 AM By: Linton Ham MD Entered By: Linton Ham on 01/21/2021 12:41:28 -------------------------------------------------------------------------------- Total Contact Cast Details Patient Name: Date of Service: [name], Pierce 01/21/2021 10:30 A M Medical Record Number: [id number] Patient Account Number: [id number] Date of Birth/Sex: Treating RN: 1975/12/16 (45 y.o. [name] Primary Care Provider: [name] Other Clinician: Referring Provider: Treating Provider/Extender: [name] in Treatment: 27 T Contact Cast Applied for Wound Assessment: otal Wound #2 Left Calcaneus Performed By: Physician Ricard Dillon., MD Post Procedure Diagnosis Same as Pre-procedure Electronic Signature(s) Signed: 01/22/2021 10:33:35 AM By: Linton Ham MD Entered By: Linton Ham on 01/21/2021 12:37:10 -------------------------------------------------------------------------------- SuperBill Details Patient Name: Date of Service: [name], [place]. 01/21/2021 Medical Record Number: [id number] Patient Account Number: [id number] Date of Birth/Sex: Treating RN: 10-06-1975 (45 y.o. [name], [name] Primary Care Provider: [name] Other Clinician: Referring Provider: Treating Provider/Extender: [name] in Treatment: 27 Diagnosis Coding ICD-10 Codes Code Description [id number] Pressure ulcer of other site, stage 3 M62.81 Muscle weakness (generalized) I10 Essential (primary) hypertension L97.512 Non-pressure chronic ulcer of other part of right foot with fat layer exposed L97.522 Non-pressure chronic ulcer of other part of left  foot with fat layer exposed Facility Procedures CPT4 Code: [id number] Description: [phone] - DEBRIDE WOUND 1ST 20 SQ CM OR < ICD-10 Diagnosis Description L97.512 Non-pressure chronic ulcer of other part of right foot with fat layer exposed Modifier: Quantity: 1 CPT4 Code: [id number] Description: 52080 - APPLY TOTAL CONTACT LEG CAST ICD-10 Diagnosis Description L97.522 Non-pressure chronic ulcer of other 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 Modifier: 59 Quantity: 1 Physician Procedures : CPT4 Code Description Modifier [id number] - WC PHYS DEBR WO ANESTH 20 SQ CM ICD-10 Diagnosis Description L97.512 Non-pressure chronic ulcer of other part of right foot with fat layer exposed Quantity: 1 : [id number] 02111 - WC PHYS APPLY TOTAL CONTACT CAST 68 ICD-10 Diagnosis Description L97.522 Non-pressure chronic ulcer of other 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 Quantity: 1 Electronic Signature(s) Signed: 01/22/2021 10:33:35 AM By: Linton Ham MD Entered By: Linton Ham on 01/21/2021 12:42:12

## 2021-01-25 NOTE — Progress Notes (Signed)
[name], [name] ([id number]) Visit Report for 01/21/2021 Arrival Information Details Patient Name: Date of Service: [name], [name] 01/21/2021 10:30 A M Medical Record Number: [id number] Patient Account Number: [id number] Date of Birth/Sex: Treating RN: 1975-12-19 (45 y.o. [name], [name] Primary Care [name]: [name] Other Clinician: Referring [name]: Treating [name]/Extender: [name] in Treatment: 27 Visit Information History Since Last Visit Added or deleted any medications: No Patient Arrived: Ambulatory Any new allergies or adverse reactions: No Arrival Time: 10:56 Had a fall or experienced change in No Accompanied By: self activities of daily living that may affect Transfer Assistance: None risk of falls: Patient Identification Verified: Yes Signs or symptoms of abuse/neglect since last visito No Secondary Verification Process Completed: Yes Hospitalized since last visit: No Patient Requires Transmission-Based Precautions: No Implantable device outside of the clinic excluding No Patient Has Alerts: No cellular tissue based products placed in the center since last visit: Has Dressing in Place as Prescribed: Yes Pain Present Now: No Electronic Signature(s) Signed: 01/21/2021 11:18:22 AM By: [name] Entered By: [name] on 01/21/2021 10:57:54 -------------------------------------------------------------------------------- Encounter Discharge Information Details Patient Name: Date of Service: [name], [name]. 01/21/2021 10:30 A M Medical Record Number: [id number] Patient Account Number: [id number] Date of Birth/Sex: Treating RN: [birth year] (45 y.o. [name] Primary Care Shaquan Missey: [name] Other Clinician: Referring [name]: Treating [name]/Extender: [name] in Treatment: 27 Encounter Discharge Information Items Post Procedure Vitals Discharge Condition: Stable Temperature  (F): 98.7 Ambulatory Status: Wheelchair Pulse (bpm): 84 Discharge Destination: Home Respiratory Rate (breaths/min): 18 Transportation: Private Auto Blood Pressure (mmHg): 145/83 Accompanied By: aunt Schedule Follow-up Appointment: Yes Clinical Summary of Care: Patient Declined Electronic Signature(s) Signed: 01/21/2021 5:31:32 PM By: [name] Gouty RN, BSN Entered By: [name] on 01/21/2021 13:41:42 -------------------------------------------------------------------------------- Lower Extremity Assessment Details Patient Name: Date of Service: [name], [place] 01/21/2021 10:30 A M Medical Record Number: [id number] Patient Account Number: [id number] Date of Birth/Sex: Treating RN: 08/06/1976 (45 y.o. [name] Primary Care [name]: [name] Other Clinician: Referring [name]: Treating [name]/Extender: [name] in Treatment: 27 Edema Assessment Assessed: [name]: No] [name]: No] Edema: [Left: No] [Right: No] Calf Left: Right: Point of Measurement: 29 cm From Medial Instep 42 cm 43 cm Ankle Left: Right: Point of Measurement: 9 cm From Medial Instep 24 cm 23 cm Vascular Assessment Pulses: Dorsalis Pedis Palpable: [Left:Yes] [Right:Yes] Electronic Signature(s) Signed: 01/25/2021 5:37:12 PM By: [name] Hurst RN, BSN Entered By: [name] on 01/21/2021 11:23:00 -------------------------------------------------------------------------------- Multi Wound Chart Details Patient Name: Date of Service: [name], [place] 01/21/2021 10:30 A M Medical Record Number: [id number] Patient Account Number: [id number] Date of Birth/Sex: Treating RN: Sep 27, 1975 (45 y.o. [name], [name] Primary Care [name]: [name] Other Clinician: Referring [name]: Treating [name]/Extender: [name] in Treatment: 27 Vital Signs Height(in): 69 Pulse(bpm): 46 Weight(lbs): 280 Blood Pressure(mmHg): 145/83 Body Mass  Index(BMI): 41 Temperature(F): 98.7 Respiratory Rate(breaths/min): 18 Photos: [1:No Photos Right Calcaneus] [2:No Photos Left Calcaneus] [N/A:N/A N/A] Wound Location: [1:Pressure Injury] [2:Pressure Injury] [N/A:N/A] Wounding Event: [1:Pressure Ulcer] [2:Pressure Ulcer] [N/A:N/A] Primary Etiology: [1:Asthma, Angina, Hypertension] [2:Asthma, Angina, Hypertension] [N/A:N/A] Comorbid History: [1:10/07/2019] [2:10/07/2019] [N/A:N/A] Date Acquired: [1:27] [2:27] [N/A:N/A] Weeks of Treatment: [1:Open] [2:Open] [N/A:N/A] Wound Status: [1:3.2x1x0.3] [2:4.4x1.5x0.3] [N/A:N/A] Measurements L x W x D (cm) [1:2.513] [2:5.184] [N/A:N/A] A (cm) : rea [1:0.754] [2:1.555] [N/A:N/A] Volume (cm) : [1:92.10%] [2:80.90%] [N/A:N/A] % Reduction in A rea: [1:97.60%] [2:94.30%] [  N/A:N/A] % Reduction in Volume: [1:Category/Stage III] [2:Category/Stage III] [N/A:N/A] Classification: [1:Medium] [2:Medium] [N/A:N/A] Exudate A mount: [1:N/A] [2:Serosanguineous] [N/A:N/A] Exudate Type: [1:N/A] [2:red, brown] [N/A:N/A] Exudate Color: [1:Distinct, outline attached] [2:Distinct, outline attached] [N/A:N/A] Wound Margin: [1:Large (67-100%)] [2:Large (67-100%)] [N/A:N/A] Granulation A mount: [1:Red, Pink] [2:Pink] [N/A:N/A] Granulation Quality: [1:Small (1-33%)] [2:None Present (0%)] [N/A:N/A] Necrotic A mount: [1:Eschar] [2:N/A] [N/A:N/A] Necrotic Tissue: [1:Fat Layer (Subcutaneous Tissue): Yes Fat Layer (Subcutaneous Tissue): Yes N/A] Exposed Structures: [1:Fascia: No Tendon: No Muscle: No Joint: No Bone: No Medium (34-66%)] [2:Fascia: No Tendon: No Muscle: No Joint: No Bone: No Medium (34-66%)] [N/A:N/A] Epithelialization: [1:Debridement - Selective/Open Wound N/A] [N/A:N/A] Debridement: Pre-procedure Verification/Time Out 11:35 [2:N/A] [N/A:N/A] Taken: [1:Lidocaine 4% Topical Solution] [2:N/A] [N/A:N/A] Pain Control: [1:Callus] [2:N/A] [N/A:N/A] Tissue Debrided: [1:Non-Viable Tissue] [2:N/A] [N/A:N/A] Level:  [1:0.75] [2:N/A] [N/A:N/A] Debridement A (sq cm): [1:rea Curette] [2:N/A] [N/A:N/A] Instrument: [1:Minimum] [2:N/A] [N/A:N/A] Bleeding: [1:Pressure] [2:N/A] [N/A:N/A] Hemostasis A chieved: [1:0] [2:N/A] [N/A:N/A] Procedural Pain: [1:0] [2:N/A] [N/A:N/A] Post Procedural Pain: [1:Procedure was tolerated well] [2:N/A] [N/A:N/A] Debridement Treatment Response: [1:3.6x1x0.3] [2:N/A] [N/A:N/A] Post Debridement Measurements L x W x D (cm) [1:0.848] [2:N/A] [N/A:N/A] Post Debridement Volume: (cm) [1:Category/Stage III] [2:N/A] [N/A:N/A] Post Debridement Stage: [1:Debridement] [2:T Contact Cast otal] [N/A:N/A] Treatment Notes Electronic Signature(s) Signed: 01/21/2021 5:31:56 PM By: [name] Signed: 01/22/2021 10:33:35 AM By: Linton Ham MD Entered By: Linton Ham on 01/21/2021 12:36:36 -------------------------------------------------------------------------------- Multi-Disciplinary Care Plan Details Patient Name: Date of Service: [name], [place] 01/21/2021 10:30 A M Medical Record Number: [id number] Patient Account Number: [id number] Date of Birth/Sex: Treating RN: 1975/12/04 (45 y.o. [name] Primary Care Nasim Habeeb: [name] Other Clinician: Referring Curtisha Bendix: Treating Cecil Bixby/Extender: [name] in Treatment: 27 Multidisciplinary Care Plan reviewed with physician Active Inactive Abuse / Safety / Falls / Self Care Management Nursing Diagnoses: Potential for falls Goals: Patient/caregiver will verbalize/demonstrate measures taken to prevent injury and/or falls Date Initiated: 07/15/2020 Target Resolution Date: 03/05/2021 Goal Status: Active Interventions: Assess fall risk on admission and as needed Assess impairment of mobility on admission and as needed per policy Notes: Electronic Signature(s) Signed: 01/21/2021 5:31:56 PM By: [name] Entered By: [name] on 01/21/2021  11:42:51 -------------------------------------------------------------------------------- Pain Assessment Details Patient Name: Date of Service: [name], [place] E. 01/21/2021 10:30 A M Medical Record Number: [id number] Patient Account Number: [id number] Date of Birth/Sex: Treating RN: 1975-09-03 (45 y.o. [name] Primary Care Twan Harkin: [name] Other Clinician: Referring Averleigh Savary: Treating Hayk Divis/Extender: [name] in Treatment: 27 Active Problems Location of Pain Severity and Description of Pain Patient Has Paino No Site Locations Pain Management and Medication Current Pain Management: Electronic Signature(s) Signed: 01/21/2021 11:18:22 AM By: [name] Signed: 01/21/2021 5:31:56 PM By: [name] Entered By: [name] on 01/21/2021 11:00:37 -------------------------------------------------------------------------------- Patient/Caregiver Education Details Patient Name: Date of Service: Ciro Backer 6/16/2022andnbsp10:30 [place] Record Number: [id number] Patient Account Number: [id number] Date of Birth/Gender: Treating RN: April 05, 1976 (45 y.o. [name] Primary Care Physician: [name] Other Clinician: Referring Physician: Treating Physician/Extender: [name] in Treatment: 27 Education Assessment Education Provided To: Patient Education Topics Provided Wound/Skin Impairment: Handouts: Skin Care Do's and Dont's Methods: Explain/Verbal Responses: Reinforcements needed Electronic Signature(s) Signed: 01/21/2021 5:31:56 PM By: [name] Entered By: [name] on 01/21/2021 11:43:04 -------------------------------------------------------------------------------- Wound Assessment Details Patient Name: Date of Service: [name], [place] 01/21/2021 10:30 A M Medical Record Number: [id number] Patient Account Number: [id number] Date of Birth/Sex: Treating  RN: 05/01/1976  (45 y.o. [name], [name] Primary Care Nykerria Macconnell: [name] Other Clinician: Referring Janiel Crisostomo: Treating Emerlyn Mehlhoff/Extender: [name] in Treatment: 27 Wound Status Wound Number: 1 Primary Etiology: Pressure Ulcer Wound Location: Right Calcaneus Wound Status: Open Wounding Event: Pressure Injury Comorbid History: Asthma, Angina, Hypertension Date Acquired: 10/07/2019 Weeks Of Treatment: 27 Clustered Wound: No Photos Photo Uploaded By: [name] on 01/21/2021 17:12:58 Wound Measurements Length: (cm) 3.2 Width: (cm) 1 Depth: (cm) 0.3 Area: (cm) 2.513 Volume: (cm) 0.754 % Reduction in Area: 92.1% % Reduction in Volume: 97.6% Epithelialization: Medium (34-66%) Tunneling: No Undermining: No Wound Description Classification: Category/Stage III Wound Margin: Distinct, outline attached Exudate Amount: Medium Foul Odor After Cleansing: No Slough/Fibrino No Wound Bed Granulation Amount: Large (67-100%) Exposed Structure Granulation Quality: Red, Pink Fascia Exposed: No Necrotic Amount: Small (1-33%) Fat Layer (Subcutaneous Tissue) Exposed: Yes Necrotic Quality: Eschar Tendon Exposed: No Muscle Exposed: No Joint Exposed: No Bone Exposed: No Treatment Notes Wound #1 (Calcaneus) Wound Laterality: Right Cleanser Wound Cleanser Discharge Instruction: Cleanse the wound with wound cleanser prior to applying a clean dressing using gauze sponges, not tissue or cotton balls. Normal Saline Discharge Instruction: Cleanse the wound with Normal Saline prior to applying a clean dressing using gauze sponges, not tissue or cotton balls. Peri-Wound Care Topical Primary Dressing KerraCel Ag Gelling Fiber Dressing, 2x2 in (silver alginate) Discharge Instruction: ****Ensure to apply into divot area of wound.**** Apply silver alginate to wound bed as instructed Secondary Dressing ABD Pad, 5x9 Discharge Instruction: Apply over primary dressing as  directed. Secured With Elastic Bandage 4 inch (ACE bandage) Discharge Instruction: Secure with ACE bandage as directed. Kerlix Roll Sterile, 4.5x3.1 (in/yd) Discharge Instruction: Secure with Kerlix as directed. Compression Wrap Compression Stockings Add-Ons Electronic Signature(s) Signed: 01/21/2021 5:31:56 PM By: [name] Signed: 01/25/2021 5:37:12 PM By: [name] Hurst RN, BSN Previous Signature: 01/21/2021 11:18:22 AM Version By: [name] Entered By: [name] on 01/21/2021 11:23:38 -------------------------------------------------------------------------------- Wound Assessment Details Patient Name: Date of Service: [name], [place] 01/21/2021 10:30 A M Medical Record Number: [id number] Patient Account Number: [id number] Date of Birth/Sex: Treating RN: Oct 20, 1975 (45 y.o. [name], [name] Primary Care Brookes Craine: [name] Other Clinician: Referring Barron Vanloan: Treating Talin Rozeboom/Extender: [name] in Treatment: 27 Wound Status Wound Number: 2 Primary Etiology: Pressure Ulcer Wound Location: Left Calcaneus Wound Status: Open Wounding Event: Pressure Injury Comorbid History: Asthma, Angina, Hypertension Date Acquired: 10/07/2019 Weeks Of Treatment: 27 Clustered Wound: No Photos Photo Uploaded By: [name] on 01/21/2021 17:12:59 Wound Measurements Length: (cm) 4.4 Width: (cm) 1.5 Depth: (cm) 0.3 Area: (cm) 5.184 Volume: (cm) 1.555 % Reduction in Area: 80.9% % Reduction in Volume: 94.3% Epithelialization: Medium (34-66%) Tunneling: No Undermining: No Wound Description Classification: Category/Stage III Wound Margin: Distinct, outline attached Exudate Amount: Medium Exudate Type: Serosanguineous Exudate Color: red, brown Foul Odor After Cleansing: No Slough/Fibrino No Wound Bed Granulation Amount: Large (67-100%) Exposed Structure Granulation Quality: Pink Fascia Exposed: No Necrotic Amount: None Present  (0%) Fat Layer (Subcutaneous Tissue) Exposed: Yes Tendon Exposed: No Muscle Exposed: No Joint Exposed: No Bone Exposed: No Treatment Notes Wound #2 (Calcaneus) Wound Laterality: Left Cleanser Wound Cleanser Discharge Instruction: Cleanse the wound with wound cleanser prior to applying a clean dressing using gauze sponges, not tissue or cotton balls. Normal Saline Discharge Instruction: Cleanse the wound with Normal Saline prior to applying a clean dressing using gauze sponges, not tissue or cotton balls. Peri-Wound Care Zinc Oxide Ointment 30g tube Discharge Instruction: ****  Apply Zinc Oxide to periwound with each dressing change**** Topical Primary Dressing KerraCel Ag Gelling Fiber Dressing, 2x2 in (silver alginate) Discharge Instruction: Apply silver alginate to wound bed as instructed Secondary Dressing Woven Gauze Sponge, Non-Sterile 4x4 in Discharge Instruction: Apply over primary dressing as directed. Zetuvit Plus 4x8 in Discharge Instruction: Apply over primary dressing as directed. Secured With [employer], 4.5x3.1 (in/yd) Discharge Instruction: Secure with Kerlix as directed. Compression Wrap Compression Stockings Add-Ons Electronic Signature(s) Signed: 01/21/2021 5:31:56 PM By: [name] Signed: 01/25/2021 5:37:12 PM By: [name] Hurst RN, BSN Previous Signature: 01/21/2021 11:18:22 AM Version By: [name] Entered By: [name] on 01/21/2021 11:24:19 -------------------------------------------------------------------------------- Vitals Details Patient Name: Date of Service: [name], [place] 01/21/2021 10:30 A M Medical Record Number: [id number] Patient Account Number: [id number] Date of Birth/Sex: Treating RN: 1976/07/13 (45 y.o. [name] Primary Care Lyra Alaimo: [name] Other Clinician: Referring Bridey Brookover: Treating Crystalmarie Yasin/Extender: [name] in Treatment: 27 Vital Signs Time Taken:  10:58 Temperature (F): 98.7 Height (in): 69 Pulse (bpm): 84 Weight (lbs): 280 Respiratory Rate (breaths/min): 18 Body Mass Index (BMI): 41.3 Blood Pressure (mmHg): 145/83 Reference Range: 80 - 120 mg / dl Electronic Signature(s) Signed: 01/21/2021 11:18:22 AM By: [name] Entered By: [name] on 01/21/2021 11:00:21

## 2021-01-27 ENCOUNTER — Other Ambulatory Visit: Payer: Self-pay

## 2021-01-27 ENCOUNTER — Ambulatory Visit (INDEPENDENT_AMBULATORY_CARE_PROVIDER_SITE_OTHER): Payer: BC Managed Care – PPO | Admitting: Podiatry

## 2021-01-27 DIAGNOSIS — L97512 Non-pressure chronic ulcer of other part of right foot with fat layer exposed: Secondary | ICD-10-CM

## 2021-01-27 DIAGNOSIS — B351 Tinea unguium: Secondary | ICD-10-CM

## 2021-01-27 DIAGNOSIS — L97522 Non-pressure chronic ulcer of other 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: Secondary | ICD-10-CM | POA: Diagnosis not present

## 2021-01-27 DIAGNOSIS — M79676 Pain in unspecified toe(s): Secondary | ICD-10-CM

## 2021-01-27 NOTE — Progress Notes (Signed)
HPI: 45 y.o. male presenting today for follow-up evaluation of bilateral wounds to the weightbearing surfaces of the bilateral feet.  Patient continues to be managed at the [facility] for the wounds.  No new complaints at this time Today the patient complains of elongated thickened sensitive nails 1-5 right.  He is unable to trim his own toenails.  Past Medical History:  Diagnosis Date   Anginal pain ([place])    with covid   Anxiety    Asthma    Dyspnea    GERD (gastroesophageal reflux disease)    Headache    History of kidney stones    LEFT URETERAL STONE   HTN (hypertension)    Pancreatitis 2018   GALLBALDDER SLUDGE CAUSED ISSUED RESOLVED   Pneumonia 07/2019   covid    Physical Exam: General: The patient is alert and oriented x3 in no acute distress.  Dermatology: Full-thickness pressure ulcers noted to the weightbearing surfaces of the right foot posterior heel.  Granular wound base.  No exposed bone.  Total contact cast left intact to the left foot Elongated, thickened, discolored nails noted 1-5 right foot with associated tenderness to palpation  Vascular: Palpable pedal pulses bilaterally.  There is some minimal edema edema noted. Capillary refill within normal limits.  Neurological: Epicritic and protective threshold grossly intact bilaterally.   Musculoskeletal Exam: Range of motion within normal limits to all pedal and ankle joints bilateral. Muscle strength 5/5 in all groups bilateral.   MRI impression 08/27/2020: 1. Large plantar soft tissue ulceration underlying the posterior calcaneus with surrounding cellulitis. No organized or rim enhancing fluid collection. 2. Prominent bone marrow edema and enhancement within the plantar and lateral aspects of the calcaneal tuberosity favored to represent early acute osteomyelitis. 3. Prominent intramuscular edema within the plantar foot musculature suggesting a nonspecific myositis. 4. Edema and enhancement in the region of the  distal posterior tibial vein at the level of the medial malleolus and plantar hindfoot, which may reflect a superficial thrombophlebitis. 5. Mild distal Achilles tendinosis.  Assessment: 1.  Full-thickness pressure ulcers bilateral feet 2.  Pain due to onychomycosis of toenails right foot 3.  History of COVID-19 hospitalization.  Discharge 12/04/2019 4.  Status post debridement with application of skin substitute bilateral feet.  DOS: 03/27/2020 5.  Acute early stages of osteomyelitis right calcaneus; resolved  Plan of Care:  1. Patient evaluated.   2.  Mechanical debridement of nails 1-5 right foot was performed using a nail nipper without incident or bleeding 3.  The total contact cast was left intact to the left foot 4.  Dressings changed right foot 5.  Explained to the patient that I am going to turn the wound care management over to Dr. Dellia Nims @ [facility].  He has been seeing the patient weekly and doing an exceptional job in managing the wounds.  If I can be of any assistance or if Dr. Dellia Nims feels it necessary to take the patient for for surgical debridement I am more than happy to do so 6.  Return to clinic as needed  Edrick Kins, DPM [facility]  Dr. Edrick Kins, DPM    [address], [place] [address]  Office [phone]  Fax [phone]

## 2021-01-28 ENCOUNTER — Encounter (HOSPITAL_BASED_OUTPATIENT_CLINIC_OR_DEPARTMENT_OTHER): Payer: BC Managed Care – PPO | Admitting: Internal Medicine

## 2021-01-28 ENCOUNTER — Ambulatory Visit: Payer: Managed Care, Other (non HMO) | Admitting: Neurology

## 2021-01-28 DIAGNOSIS — L89623 Pressure ulcer of left heel, stage 3: Secondary | ICD-10-CM | POA: Diagnosis not present

## 2021-01-28 DIAGNOSIS — L97512 Non-pressure chronic ulcer of other part of right foot with fat layer exposed: Secondary | ICD-10-CM | POA: Diagnosis not present

## 2021-01-28 DIAGNOSIS — L89893 Pressure ulcer of other site, stage 3: Secondary | ICD-10-CM | POA: Diagnosis not present

## 2021-01-28 DIAGNOSIS — L97522 Non-pressure chronic ulcer of other 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: Secondary | ICD-10-CM | POA: Diagnosis not present

## 2021-01-28 NOTE — Progress Notes (Signed)
[name], [name] ([id number]) Visit Report for 01/28/2021 Debridement Details Patient Name: Date of Service: [name], [name] 01/28/2021 9:30 A M Medical Record Number: [id number] Patient Account Number: [id number] Date of Birth/Sex: Treating RN: 02-Nov-1975 (45 y.o. [name] Primary Care Provider: Cristie [name] Other Clinician: Referring Provider: Treating Provider/Extender: [name] in Treatment: 28 Debridement Performed for Assessment: Wound #2 Left Calcaneus Performed By: Physician Ricard Dillon., MD Debridement Type: Debridement Level of Consciousness (Pre-procedure): Awake and Alert Pre-procedure Verification/Time Out Yes - 10:00 Taken: Start Time: 10:01 Pain Control: Lidocaine 4% T opical Solution T Area Debrided (L x W): otal 4.8 (cm) x 2 (cm) = 9.6 (cm) Tissue and other material debrided: Viable, Non-Viable, Callus, Slough, Subcutaneous, Skin: Dermis , Skin: Epidermis, Fibrin/Exudate, Slough Level: Skin/Subcutaneous Tissue Debridement Description: Excisional Instrument: Curette Bleeding: Moderate Hemostasis Achieved: Pressure End Time: 10:07 Procedural Pain: 0 Post Procedural Pain: 0 Response [name] Treatment: Procedure was tolerated well Level of Consciousness (Post- Awake and Alert procedure): Post Debridement Measurements of Total Wound Length: (cm) 4.5 Stage: Category/Stage III Width: (cm) 1.6 Depth: (cm) 0.4 Volume: (cm) 2.262 Character of Wound/Ulcer Post Debridement: Improved Post Procedure Diagnosis Same as Pre-procedure Electronic Signature(s) Signed: 01/28/2021 5:20:02 PM By: Linton Ham MD Signed: 01/28/2021 5:46:50 PM By: [name] Entered By: Linton Ham on 01/28/2021 10:16:58 -------------------------------------------------------------------------------- HPI Details Patient Name: Date of Service: [name], [name] [place] E. 01/28/2021 9:30 A M Medical Record Number: [id number] Patient Account Number:  [id number] Date of Birth/Sex: Treating RN: Oct 03, 1975 (45 y.o. [name] Primary Care Provider: Cristie [name] Other Clinician: Referring Provider: Treating Provider/Extender: [name] in Treatment: 28 History of Present Illness HPI Description: 07/15/2020 upon evaluation today patient presents for initial inspection here in our clinic concerning issues he has been having with the bottoms of his feet bilaterally. He states these actually occurred as wounds when he was hospitalized for 5 months secondary [name] Covid. He was apparently with tilting bed where he was in an upright position quite frequently and apparently this occurred in some way shape or form during that time. Fortunately there is no sign of active infection at this time. No fevers, chills, nausea, vomiting, or diarrhea. With that being said he still has substantial wounds on the plantar aspects of his feet Theragen require quite a bit of work [name] get these [name] heal. He has been using Santyl currently though that is been problematic both in receiving the medication as well as actually paid for it as it is become quite expensive. Prior [name] the experience with Covid the patient really did not have any major medical problems other than hypertension he does have some mild generalized weakness following the Covid experience. 07/22/2020 on evaluation today patient appears [name] be doing okay in regard [name] his foot ulcers I feel like the wound beds are showing signs of better improvement that I do believe the Iodoflex is helping in this regard. With that being said he does have a lot of drainage currently and this is somewhat blue/green in nature which is consistent with Pseudomonas. I do think a culture today would be appropriate for [place] [name] evaluate and see if that is indeed the case I would likely start him on antibiotic orally as well he is not allergic [name] Cipro knows of no issues he has had in the past 12/21; patient was  admitted [name] the clinic earlier this month with bilateral presumed pressure ulcers on the bottom of his  feet apparently related [name] excessive pressure from a tilt table arrangement in the intensive care unit. Patient relates this [name] being on ECMO but I am not really sure that is exactly related [name] that. I must say I have never seen anything like this. He has fairly extensive full-thickness wounds extending from his heel towards his midfoot mostly centered laterally. There is already been some healing distally. He does not appear [name] have an arterial issue. He has been using gentamicin [name] the wound surfaces with Iodoflex [name] help with ongoing debridement 1/6; this is a patient with pressure ulcers on the bottom of his feet related [name] excessive pressure from a standing position in the intensive care unit. He is complaining of a lot of pain in the right heel. He is not a diabetic. 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 We have been using Iodoflex [name] help prepare the surfaces of both wounds for an advanced treatment product. He is nonambulatory spending most of his time in a wheelchair I have asked him not [name] propel the wheelchair with his heels 1/13; in general his wounds look better not much surface area change we have been using Iodoflex as of last week. I did an x-ray of the right heel as the patient was complaining of pain. I had some thoughts about a stress fracture perhaps Achilles tendon problems however what it showed was erosive changes along the inferior aspect of the calcaneus he now has a MRI booked for 1/20. 1/20; in general his wounds continue [name] be better. Some improvement in the large narrow areas proximally in his foot. He is still complaining of pain in the right heel and tenderness in certain areas of this wound. His MRI is tonight. I am not just looking for osteomyelitis that was brought up on the x-ray I am wondering about stress fractures, tendon ruptures etc.  He has no such findings on the left. Also noteworthy is that the patient had critical illness neuropathy and some of the discomfort may be actual improvement in nerve function I am just not sure. These wounds were initially in the setting of severe critical illness related [name] COVID-19. He was put in a standing position. He may have also been on pressors at the point contributing [name] tissue ischemia. By his description at some point these wounds were grossly necrotic extending proximally up into the Achilles part of his heel. I do not know that I have ever really seen pictures of them like this although they may exist in epic We have ordered Tri layer Oasis. I am trying [name] stimulate some granulation in these areas. This is of course assuming the MRI is negative for infection 1/27; since the patient was last here he saw Dr. Juleen China of infectious disease. He is planned for vancomycin and ceftriaxone. Prior operative culture grew MSSA. Also ordered baseline lab work. He also ordered arterial studies although the ABIs in our clinic were normal as well as his clinical exam these were normal I do not think he needs [name] see vascular surgery. His ABIs at the [facility] were 1.22 in the right triphasic waveforms with a normal TBI of 1.15 on the left ABI of 1.22 with triphasic waveforms and a normal TBI of 1.08. Finally he saw Dr. Amalia Hailey who will follow him in for 2 months. At this point I do not think he felt that he needed a procedure on the right calcaneal bone. Dr. Juleen China is elected for broad-spectrum antibiotic The patient  is still having pain in the right heel. He walks with a walker 2/3; wounds are generally smaller. He is tolerating his IV antibiotics. I believe this is vancomycin and ceftriaxone. We are still waiting for Oasis burn in terms of his out-of-pocket max which he should be meeting soon given the IV antibiotics, MRIs etc. I have asked him [name] check in on this. We are using silver collagen in the  meantime the wounds look better 2/10; tolerating IV vancomycin and Rocephin. We are waiting [name] apply for Oasis. Although I am not really sure where he is in his out-of-pocket max. 2/17 started the first application of Oasis trilayer. Still on antibiotics. The wounds have generally look better. The area on the left has a little more surface slough requiring debridement 5/05; second application of Oasis trilayer. The wound surface granulation is generally look better. The area on the left with undermining laterally I think is come in a bit. 10/08/2020 upon evaluation today patient is here today for [employer] application #3. Fortunately he seems [name] be doing extremely well with regard [name] this and we are seeing a lot of new epithelial growth which is great news. Fortunately there is no signs of active infection at this time. 10/16/2020 upon evaluation today patient appears [name] be doing well with regard [name] his foot ulcers. Do believe the Oasis has been of benefit for him. I do not see any signs of infection right now which is great news and I think that he has a lot of new epithelial growth which is great [name] see as well. The patient is very pleased [name] hear all of this. I do think we can proceed with the Oasis trilayer #4 today. 3/18; not as much improvement in these areas on his heels that I was hoping. I did reapply trilateral Oasis today the tissue looks healthier but not as much fill in as I was hoping. 3/25; better looking today I think this is come in a bit the tissue looks healthier. Triple layer Oasis reapplied #6 4/1; somewhat better looking definitely better looking surface not as much change in surface area as I was hoping. He may be spending more time [place] on days then he needs [name] although he does have heel offloading boots. Triple layer Oasis reapplied #7 4/7; unfortunately apparently [facility] will not approve any further Oasis which is unfortunate since the patient did respond  nicely both in terms of the condition of the wound bed as well as surface area. There is still some drainage coming from the wound but not a lot there does not appear [name] be any infection 4/15; we have been using Hydrofera Blue. He continues [name] have nice rims of epithelialization on the right greater than the left. The left the epithelialization is coming from the tip of his heel. There is moderate drainage. In this that concerns me about a total contact cast. There is no evidence of infection 4/29; patient has been using Hydrofera Blue with dressing changes. He has no complaints or issues today. 5/5; using Hydrofera Blue. I actually think that he looks marginally better than the last time I saw this 3 weeks ago. There are rims of epithelialization on the left thumb coming from the medial side on the right. Using Hydrofera Blue 5/12; using Hydrofera Blue. These continue [name] make improvements in surface area. His drainage was not listed as severe I therefore went ahead and put a cast on the left foot. Right foot we  will continue [name] dress his previous 5/16; back for first total contact cast change. He did not tolerate this particularly well cast injury on the anterior tibia among other issues. Difficulty sleeping. I talked him about this in some detail and afterwards is elected [name] continue. I told him I would like [name] have a cast on for 3 weeks [name] see if this is going [name] help at all. I think he agreed 5/19; I think the wound is better. There is no tunneling towards his midfoot. The undermining medially also looks better. He has a rim of new skin distally. I think we are making progress here. The area on the left also continues [name] look somewhat better [name] me using Hydrofera Blue. He has a list of complaints about the cast but none of them seem serious 5/26; patient presents for 1 week follow-up. He has been using a total contact cast and tolerating this well. Hydrofera Blue is the main dressing used.  He denies signs of infection. 6/2 Hydrofera Blue total contact cast on the left. These were large ulcers that formed in intensive care unit where the patient was recovering from [place]. May have had something to do with being ventilated in an upright positiono Pressors etc. We have been able [name] get the areas down considerably and a viable surface. There is some epithelialization in both sides. Note made of drainage 6/9; changed [name] [facility] last time because of drainage. He arrives with better looking surfaces and dimensions on the left than the right. Paradoxically the right actually probes more towards his midfoot the left is largely close down but both of these look improved. Using a total contact cast on the left 6/16; complex wounds on his bilateral plantar heels which were initially pressure injury from a stay in the ICU with COVID. We have been using silver alginate most recently. His dimensions of come in quite dramatically however not recently. We have been putting the left foot in a total contact cast 6/23; complex wounds on the bilateral plantar heels. I been putting the left in the cast paradoxically the area on the right is the one that is going towards closure at a faster rate. Quite a bit of drainage on the left. The patient went [name] see Dr. Amalia Hailey who said he was going [name] standby for skin grafts. I had actually considered sending him for skin grafts however he would be mandatorily off his feet for a period of weeks [name] months. I am thinking that the area on the right is going [name] close on its own the area on the left has been more stubborn even though we have him in a total contact cast Electronic Signature(s) Signed: 01/28/2021 5:20:02 PM By: Linton Ham MD Entered By: Linton Ham on 01/28/2021 10:20:47 -------------------------------------------------------------------------------- Physical Exam Details Patient Name: Date of Service: [name], [name] [place]. 01/28/2021 9:30 A  M Medical Record Number: [id number] Patient Account Number: [id number] Date of Birth/Sex: Treating RN: 1975/11/03 (45 y.o. [name] Primary Care Provider: Cristie [name] Other Clinician: Referring Provider: Treating Provider/Extender: [name] in Treatment: 28 Constitutional Sitting or standing Blood Pressure is within target range for patient.. Pulse regular and within target range for patient.Marland Kitchen Respirations regular, non-labored and within target range.. Temperature is normal and within the target range for the patient.Marland Kitchen Appears in no distress. Notes Wound exam; bilateral heel wounds. Surface area on the right looks a lot better this week aggressive debridement last week. On the left  heel some callus around the edges removed with a #5 curette then a gritty surface debris there is no evidence of infection. Paradoxically the probing areas towards the mid part of the foot are a little worse on the right than the left. Electronic Signature(s) Signed: 01/28/2021 5:20:02 PM By: Linton Ham MD Entered By: Linton Ham on 01/28/2021 10:21:39 -------------------------------------------------------------------------------- Physician Orders Details Patient Name: Date of Service: [name], [place] 01/28/2021 9:30 A M Medical Record Number: [id number] Patient Account Number: [id number] Date of Birth/Sex: Treating RN: 31-May-1976 (45 y.o. [name], [name] Primary Care Provider: Cristie [name] Other Clinician: Referring Provider: Treating Provider/Extender: [name] in Treatment: 28 Verbal / Phone Orders: No Diagnosis Coding ICD-10 Coding Code Description [phone] Pressure ulcer of other site, stage 3 L97.512 Non-pressure chronic ulcer of other part of right foot with fat layer exposed L97.522 Non-pressure chronic ulcer of other 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 Follow-up Appointments ppointment in 1 week. - Dr. Dellia Nims Thursday for  [facility] appt. ****60 minutes **** Return A Bathing/ Shower/ Hygiene May shower with protection but do not get wound dressing(s) wet. Edema Control - Lymphedema / SCD / Other Bilateral Lower Extremities Elevate legs [name] the level of the heart or above for 30 minutes daily and/or when sitting, a frequency of: Avoid standing for long periods of time. Moisturize legs daily. - right leg and foot every night. Off-Loading Wedge shoe [name]: - Glophed Shoe [name] both feet. Other: - keep pressure off of the bottom of your feet Additional Orders / Instructions Follow Nutritious Diet Wound Treatment Wound #1 - Calcaneus Wound Laterality: Right Cleanser: Normal Saline (Generic) Every Other Day/30 Days Discharge Instructions: Cleanse the wound with Normal Saline prior [name] applying a clean dressing using gauze sponges, not tissue or cotton balls. Cleanser: Wound Cleanser Every Other Day/30 Days Discharge Instructions: Cleanse the wound with wound cleanser prior [name] applying a clean dressing using gauze sponges, not tissue or cotton balls. Prim Dressing: KerraCel Ag Gelling Fiber Dressing, 2x2 in (silver alginate) (Generic) Every Other Day/30 Days ary Discharge Instructions: ****Ensure [name] apply into divot area of wound.**** Apply silver alginate [name] wound bed as instructed Secondary Dressing: Zetuvit Plus 4x8 in (DME) (Generic) Every Other Day/30 Days Discharge Instructions: Apply over primary dressing as directed. Secured With: Elastic Bandage 4 inch (ACE bandage) Every Other Day/30 Days Discharge Instructions: Secure with ACE bandage as directed. Secured With: [employer], 4.5x3.1 (in/yd) (Generic) Every Other Day/30 Days Discharge Instructions: Secure with Kerlix as directed. Wound #2 - Calcaneus Wound Laterality: Left Cleanser: Normal Saline (Generic) Every Other Day/30 Days Discharge Instructions: Cleanse the wound with Normal Saline prior [name] applying a clean dressing using gauze sponges, not tissue or  cotton balls. Cleanser: Wound Cleanser Every Other Day/30 Days Discharge Instructions: Cleanse the wound with wound cleanser prior [name] applying a clean dressing using gauze sponges, not tissue or cotton balls. Prim Dressing: KerraCel Ag Gelling Fiber Dressing, 2x2 in (silver alginate) (Generic) Every Other Day/30 Days ary Discharge Instructions: ****Ensure [name] apply into divot area of wound.**** Apply silver alginate [name] wound bed as instructed Secondary Dressing: Zetuvit Plus 4x8 in (DME) (Generic) Every Other Day/30 Days Discharge Instructions: Apply over primary dressing as directed. Secured With: Elastic Bandage 4 inch (ACE bandage) Every Other Day/30 Days Discharge Instructions: Secure with ACE bandage as directed. Secured With: [employer], 4.5x3.1 (in/yd) (Generic) Every Other Day/30 Days Discharge Instructions: Secure with Kerlix as directed. Electronic Signature(s) Signed: 01/28/2021 5:20:02  PM By: Linton Ham MD Signed: 01/28/2021 5:46:50 PM By: [name] Entered By: [name] on 01/28/2021 10:10:51 -------------------------------------------------------------------------------- Problem List Details Patient Name: Date of Service: [name], [name] 01/28/2021 9:30 A M Medical Record Number: [id number] Patient Account Number: [id number] Date of Birth/Sex: Treating RN: 11/10/1975 (45 y.o. [name] Primary Care Provider: Cristie [name] Other Clinician: Referring Provider: Treating Provider/Extender: [name] in Treatment: 28 Active Problems ICD-10 Encounter Code Description Active Date MDM Diagnosis [phone] Pressure ulcer of other site, stage 3 07/15/2020 No Yes L97.512 Non-pressure chronic ulcer of other part of right foot with fat layer exposed 09/03/2020 No Yes L97.522 Non-pressure chronic ulcer of other 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 09/03/2020 No Yes Inactive Problems ICD-10 Code Description Active Date Inactive  Date M62.81 Muscle weakness (generalized) 07/15/2020 07/15/2020 I10 Essential (primary) hypertension 07/15/2020 07/15/2020 M86.171 Other acute osteomyelitis, right ankle and foot 09/03/2020 09/03/2020 Resolved Problems Electronic Signature(s) Signed: 01/28/2021 5:20:02 PM By: Linton Ham MD Entered By: Linton Ham on 01/28/2021 10:15:39 -------------------------------------------------------------------------------- Progress Note Details Patient Name: Date of Service: [name], [name] 01/28/2021 9:30 A M Medical Record Number: [id number] Patient Account Number: [id number] Date of Birth/Sex: Treating RN: Sep 13, 1975 (45 y.o. [name] Primary Care Provider: Cristie [name] Other Clinician: Referring Provider: Treating Provider/Extender: [name] in Treatment: 28 Subjective History of Present Illness (HPI) 07/15/2020 upon evaluation today patient presents for initial inspection here in our clinic concerning issues he has been having with the bottoms of his feet bilaterally. He states these actually occurred as wounds when he was hospitalized for 5 months secondary [name] Covid. He was apparently with tilting bed where he was in an upright position quite frequently and apparently this occurred in some way shape or form during that time. Fortunately there is no sign of active infection at this time. No fevers, chills, nausea, vomiting, or diarrhea. With that being said he still has substantial wounds on the plantar aspects of his feet Theragen require quite a bit of work [name] get these [name] heal. He has been using Santyl currently though that is been problematic both in receiving the medication as well as actually paid for it as it is become quite expensive. Prior [name] the experience with Covid the patient really did not have any major medical problems other than hypertension he does have some mild generalized weakness following the Covid experience. 07/22/2020 on evaluation  today patient appears [name] be doing okay in regard [name] his foot ulcers I feel like the wound beds are showing signs of better improvement that I do believe the Iodoflex is helping in this regard. With that being said he does have a lot of drainage currently and this is somewhat blue/green in nature which is consistent with Pseudomonas. I do think a culture today would be appropriate for [place] [name] evaluate and see if that is indeed the case I would likely start him on antibiotic orally as well he is not allergic [name] Cipro knows of no issues he has had in the past 12/21; patient was admitted [name] the clinic earlier this month with bilateral presumed pressure ulcers on the bottom of his feet apparently related [name] excessive pressure from a tilt table arrangement in the intensive care unit. Patient relates this [name] being on ECMO but I am not really sure that is exactly related [name] that. I must say I have never seen anything like this. He has fairly extensive full-thickness  wounds extending from his heel towards his midfoot mostly centered laterally. There is already been some healing distally. He does not appear [name] have an arterial issue. He has been using gentamicin [name] the wound surfaces with Iodoflex [name] help with ongoing debridement 1/6; this is a patient with pressure ulcers on the bottom of his feet related [name] excessive pressure from a standing position in the intensive care unit. He is complaining of a lot of pain in the right heel. He is not a diabetic. 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 We have been using Iodoflex [name] help prepare the surfaces of both wounds for an advanced treatment product. He is nonambulatory spending most of his time in a wheelchair I have asked him not [name] propel the wheelchair with his heels 1/13; in general his wounds look better not much surface area change we have been using Iodoflex as of last week. I did an x-ray of the right heel as the patient was  complaining of pain. I had some thoughts about a stress fracture perhaps Achilles tendon problems however what it showed was erosive changes along the inferior aspect of the calcaneus he now has a MRI booked for 1/20. 1/20; in general his wounds continue [name] be better. Some improvement in the large narrow areas proximally in his foot. He is still complaining of pain in the right heel and tenderness in certain areas of this wound. His MRI is tonight. I am not just looking for osteomyelitis that was brought up on the x-ray I am wondering about stress fractures, tendon ruptures etc. He has no such findings on the left. Also noteworthy is that the patient had critical illness neuropathy and some of the discomfort may be actual improvement in nerve function I am just not sure. These wounds were initially in the setting of severe critical illness related [name] COVID-19. He was put in a standing position. He may have also been on pressors at the point contributing [name] tissue ischemia. By his description at some point these wounds were grossly necrotic extending proximally up into the Achilles part of his heel. I do not know that I have ever really seen pictures of them like this although they may exist in epic We have ordered Tri layer Oasis. I am trying [name] stimulate some granulation in these areas. This is of course assuming the MRI is negative for infection 1/27; since the patient was last here he saw Dr. Juleen China of infectious disease. He is planned for vancomycin and ceftriaxone. Prior operative culture grew MSSA. Also ordered baseline lab work. He also ordered arterial studies although the ABIs in our clinic were normal as well as his clinical exam these were normal I do not think he needs [name] see vascular surgery. His ABIs at the [facility] were 1.22 in the right triphasic waveforms with a normal TBI of 1.15 on the left ABI of 1.22 with triphasic waveforms and a normal TBI of 1.08. Finally he saw Dr. Amalia Hailey who will  follow him in for 2 months. At this point I do not think he felt that he needed a procedure on the right calcaneal bone. Dr. Juleen China is elected for broad-spectrum antibiotic The patient is still having pain in the right heel. He walks with a walker 2/3; wounds are generally smaller. He is tolerating his IV antibiotics. I believe this is vancomycin and ceftriaxone. We are still waiting for Oasis burn in terms of his out-of-pocket max which he should be meeting  soon given the IV antibiotics, MRIs etc. I have asked him [name] check in on this. We are using silver collagen in the meantime the wounds look better 2/10; tolerating IV vancomycin and Rocephin. We are waiting [name] apply for Oasis. Although I am not really sure where he is in his out-of-pocket max. 2/17 started the first application of Oasis trilayer. Still on antibiotics. The wounds have generally look better. The area on the left has a little more surface slough requiring debridement 8/09; second application of Oasis trilayer. The wound surface granulation is generally look better. The area on the left with undermining laterally I think is come in a bit. 10/08/2020 upon evaluation today patient is here today for [employer] application #3. Fortunately he seems [name] be doing extremely well with regard [name] this and we are seeing a lot of new epithelial growth which is great news. Fortunately there is no signs of active infection at this time. 10/16/2020 upon evaluation today patient appears [name] be doing well with regard [name] his foot ulcers. Do believe the Oasis has been of benefit for him. I do not see any signs of infection right now which is great news and I think that he has a lot of new epithelial growth which is great [name] see as well. The patient is very pleased [name] hear all of this. I do think we can proceed with the Oasis trilayer #4 today. 3/18; not as much improvement in these areas on his heels that I was hoping. I did reapply trilateral Oasis  today the tissue looks healthier but not as much fill in as I was hoping. 3/25; better looking today I think this is come in a bit the tissue looks healthier. Triple layer Oasis reapplied #6 4/1; somewhat better looking definitely better looking surface not as much change in surface area as I was hoping. He may be spending more time [place] on days then he needs [name] although he does have heel offloading boots. Triple layer Oasis reapplied #7 4/7; unfortunately apparently [facility] will not approve any further Oasis which is unfortunate since the patient did respond nicely both in terms of the condition of the wound bed as well as surface area. There is still some drainage coming from the wound but not a lot there does not appear [name] be any infection 4/15; we have been using Hydrofera Blue. He continues [name] have nice rims of epithelialization on the right greater than the left. The left the epithelialization is coming from the tip of his heel. There is moderate drainage. In this that concerns me about a total contact cast. There is no evidence of infection 4/29; patient has been using Hydrofera Blue with dressing changes. He has no complaints or issues today. 5/5; using Hydrofera Blue. I actually think that he looks marginally better than the last time I saw this 3 weeks ago. There are rims of epithelialization on the left thumb coming from the medial side on the right. Using Hydrofera Blue 5/12; using Hydrofera Blue. These continue [name] make improvements in surface area. His drainage was not listed as severe I therefore went ahead and put a cast on the left foot. Right foot we will continue [name] dress his previous 5/16; back for first total contact cast change. He did not tolerate this particularly well cast injury on the anterior tibia among other issues. Difficulty sleeping. I talked him about this in some detail and afterwards is elected [name] continue. I told him I  would like [name] have a cast on  for 3 weeks [name] see if this is going [name] help at all. I think he agreed 5/19; I think the wound is better. There is no tunneling towards his midfoot. The undermining medially also looks better. He has a rim of new skin distally. I think we are making progress here. The area on the left also continues [name] look somewhat better [name] me using Hydrofera Blue. He has a list of complaints about the cast but none of them seem serious 5/26; patient presents for 1 week follow-up. He has been using a total contact cast and tolerating this well. Hydrofera Blue is the main dressing used. He denies signs of infection. 6/2 Hydrofera Blue total contact cast on the left. These were large ulcers that formed in intensive care unit where the patient was recovering from [place]. May have had something to do with being ventilated in an upright positiono Pressors etc. We have been able [name] get the areas down considerably and a viable surface. There is some epithelialization in both sides. Note made of drainage 6/9; changed [name] [facility] last time because of drainage. He arrives with better looking surfaces and dimensions on the left than the right. Paradoxically the right actually probes more towards his midfoot the left is largely close down but both of these look improved. Using a total contact cast on the left 6/16; complex wounds on his bilateral plantar heels which were initially pressure injury from a stay in the ICU with COVID. We have been using silver alginate most recently. His dimensions of come in quite dramatically however not recently. We have been putting the left foot in a total contact cast 6/23; complex wounds on the bilateral plantar heels. I been putting the left in the cast paradoxically the area on the right is the one that is going towards closure at a faster rate. Quite a bit of drainage on the left. The patient went [name] see Dr. Amalia Hailey who said he was going [name] standby for skin grafts. I had actually  considered sending him for skin grafts however he would be mandatorily off his feet for a period of weeks [name] months. I am thinking that the area on the right is going [name] close on its own the area on the left has been more stubborn even though we have him in a total contact cast Objective Constitutional Sitting or standing Blood Pressure is within target range for patient.. Pulse regular and within target range for patient.Marland Kitchen Respirations regular, non-labored and within target range.. Temperature is normal and within the target range for the patient.Marland Kitchen Appears in no distress. Vitals Time Taken: 9:42 AM, Height: 69 in, Weight: 280 lbs, BMI: 41.3, Temperature: 98.2 F, Pulse: 101 bpm, Respiratory Rate: 18 breaths/min, Blood Pressure: 135/85 mmHg. General Notes: Wound exam; bilateral heel wounds. Surface area on the right looks a lot better this week aggressive debridement last week. On the left heel some callus around the edges removed with a #5 curette then a gritty surface debris there is no evidence of infection. Paradoxically the probing areas towards the mid part of the foot are a little worse on the right than the left. Integumentary (Hair, Skin) Wound #1 status is Open. Original cause of wound was Pressure Injury. The date acquired was: 10/07/2019. The wound has been in treatment 28 weeks. The wound is located on the Right Calcaneus. The wound measures 4cm length x 2cm width x 0.3cm depth; 6.283cm^2 area and 1.885cm^3  volume. There is Fat Layer (Subcutaneous Tissue) exposed. There is no tunneling or undermining noted. There is a medium amount of drainage noted. The wound margin is distinct with the outline attached [name] the wound base. There is medium (34-66%) red, pink granulation within the wound bed. There is a medium (34-66%) amount of necrotic tissue within the wound bed including Eschar. Wound #2 status is Open. Original cause of wound was Pressure Injury. The date acquired was: 10/07/2019. The  wound has been in treatment 28 weeks. The wound is located on the Left Calcaneus. The wound measures 4.5cm length x 1.6cm width x 0.4cm depth; 5.655cm^2 area and 2.262cm^3 volume. There is Fat Layer (Subcutaneous Tissue) exposed. There is no tunneling or undermining noted. There is a medium amount of serosanguineous drainage noted. The wound margin is distinct with the outline attached [name] the wound base. There is large (67-100%) pink granulation within the wound bed. There is a small (1-33%) amount of necrotic tissue within the wound bed including Adherent Slough. Assessment Active Problems ICD-10 Pressure ulcer of other site, stage 3 Non-pressure chronic ulcer of other part of right foot with fat layer exposed Non-pressure chronic ulcer of other 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 Procedures Wound #2 Pre-procedure diagnosis of Wound #2 is a Pressure Ulcer located on the Left Calcaneus . There was a Excisional Skin/Subcutaneous Tissue Debridement with a total area of 9.6 sq cm performed by Ricard Dillon., MD. With the following instrument(s): Curette [name] remove Viable and Non-Viable tissue/material. Material removed includes Callus, Subcutaneous Tissue, Slough, Skin: Dermis, Skin: Epidermis, and Fibrin/Exudate after achieving pain control using Lidocaine 4% T opical Solution. A time out was conducted at 10:00, prior [name] the start of the procedure. A Moderate amount of bleeding was controlled with Pressure. The procedure was tolerated well with a pain level of 0 throughout and a pain level of 0 following the procedure. Post Debridement Measurements: 4.5cm length x 1.6cm width x 0.4cm depth; 2.262cm^3 volume. Post debridement Stage noted as Category/Stage III. Character of Wound/Ulcer Post Debridement is improved. Post procedure Diagnosis Wound #2: Same as Pre-Procedure Plan Follow-up Appointments: Return Appointment in 1 week. - Dr. Dellia Nims Thursday for [facility] appt. ****60 minutes  **** Bathing/ Shower/ Hygiene: May shower with protection but do not get wound dressing(s) wet. Edema Control - Lymphedema / SCD / Other: Elevate legs [name] the level of the heart or above for 30 minutes daily and/or when sitting, a frequency of: Avoid standing for long periods of time. Moisturize legs daily. - right leg and foot every night. Off-Loading: Wedge shoe [name]: - Glophed Shoe [name] both feet. Other: - keep pressure off of the bottom of your feet Additional Orders / Instructions: Follow Nutritious Diet WOUND #1: - Calcaneus Wound Laterality: Right Cleanser: Normal Saline (Generic) Every Other Day/30 Days Discharge Instructions: Cleanse the wound with Normal Saline prior [name] applying a clean dressing using gauze sponges, not tissue or cotton balls. Cleanser: Wound Cleanser Every Other Day/30 Days Discharge Instructions: Cleanse the wound with wound cleanser prior [name] applying a clean dressing using gauze sponges, not tissue or cotton balls. Prim Dressing: KerraCel Ag Gelling Fiber Dressing, 2x2 in (silver alginate) (Generic) Every Other Day/30 Days ary Discharge Instructions: ****Ensure [name] apply into divot area of wound.**** Apply silver alginate [name] wound bed as instructed Secondary Dressing: Zetuvit Plus 4x8 in (DME) (Generic) Every Other Day/30 Days Discharge Instructions: Apply over primary dressing as directed. Secured With: Elastic Bandage 4 inch (ACE bandage) Every Other  Day/30 Days Discharge Instructions: Secure with ACE bandage as directed. Secured With: [employer], 4.5x3.1 (in/yd) (Generic) Every Other Day/30 Days Discharge Instructions: Secure with Kerlix as directed. WOUND #2: - Calcaneus Wound Laterality: Left Cleanser: Normal Saline (Generic) Every Other Day/30 Days Discharge Instructions: Cleanse the wound with Normal Saline prior [name] applying a clean dressing using gauze sponges, not tissue or cotton balls. Cleanser: Wound Cleanser Every Other Day/30 Days Discharge  Instructions: Cleanse the wound with wound cleanser prior [name] applying a clean dressing using gauze sponges, not tissue or cotton balls. Prim Dressing: KerraCel Ag Gelling Fiber Dressing, 2x2 in (silver alginate) (Generic) Every Other Day/30 Days ary Discharge Instructions: ****Ensure [name] apply into divot area of wound.**** Apply silver alginate [name] wound bed as instructed Secondary Dressing: Zetuvit Plus 4x8 in (DME) (Generic) Every Other Day/30 Days Discharge Instructions: Apply over primary dressing as directed. Secured With: Elastic Bandage 4 inch (ACE bandage) Every Other Day/30 Days Discharge Instructions: Secure with ACE bandage as directed. Secured With: [employer], 4.5x3.1 (in/yd) (Generic) Every Other Day/30 Days Discharge Instructions: Secure with Kerlix as directed. 1. Very difficult pressure ulcers on the plantar aspects of both heels with areas of probing [name] the midfoot. I been using a cast on the left although its the area on the right that looks like it is improving. Therefore I did not put a total contact cast back on the left I am going [name] continue with silver alginate however changing twice daily. We put him back into a heel offloading shoe. I emphasized that this was a bit of an experiment I might put him back in a cast depending on how things go 2. I did not see any evidence of surrounding infection. Whether he has enough biofilm on the left [name] inhibit healing I am not sure about. I might consider a culture 3. He asked me about skin grafts which I had previously considered the problem with this is that he would need [name] be off his feet fairly religiously for a good long time I do not know that he is willing to do this. Nevertheless it is always an option if the wounds become completely stalled Electronic Signature(s) Signed: 01/28/2021 5:20:02 PM By: Linton Ham MD Entered By: Linton Ham on 01/28/2021  10:23:28 -------------------------------------------------------------------------------- SuperBill Details Patient Name: Date of Service: [name], [name]. 01/28/2021 Medical Record Number: [id number] Patient Account Number: [id number] Date of Birth/Sex: Treating RN: Feb 01, 1976 (44 y.o. [name] Primary Care Provider: Cristie [name] Other Clinician: Referring Provider: Treating Provider/Extender: [name] in Treatment: 28 Diagnosis Coding ICD-10 Codes Code Description [phone] Pressure ulcer of other site, stage 3 L97.512 Non-pressure chronic ulcer of other part of right foot with fat layer exposed L97.522 Non-pressure chronic ulcer of other 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 Facility Procedures CPT4 Code: [id number] Description: [place] TISSUE 20 SQ CM/< ICD-10 Diagnosis Description L97.522 Non-pressure chronic ulcer of other 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 Modifier: Quantity: 1 Physician Procedures : CPT4 Code Description Modifier [id number] - WC PHYS SUBQ TISS 20 SQ CM ICD-10 Diagnosis Description L97.522 Non-pressure chronic ulcer of other 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 Quantity: 1 Electronic Signature(s) Signed: 01/28/2021 5:20:02 PM By: Linton Ham MD Entered By: Linton Ham on 01/28/2021 10:23:42

## 2021-01-28 NOTE — Progress Notes (Addendum)
RANA, ADORNO ([id number]) Visit Report for 01/28/2021 Arrival Information Details Patient Name: Date of Service: [name], [place] 01/28/2021 9:30 A M Medical Record Number: [id number] Patient Account Number: [id number] Date of Birth/Sex: Treating RN: [birth year] (45 y.o. [name] Primary Care Jonmarc Bodkin: [name] Other Clinician: Referring Yonael Tulloch: Treating Verlon Carcione/Extender: [name] in Treatment: 38 Visit Information History Since Last Visit Added or deleted any medications: No Patient Arrived: Wheel Chair Any new allergies or adverse reactions: No Arrival Time: 09:38 Had a fall or experienced change in No Accompanied By: wife, son activities of daily living that may affect Transfer Assistance: None risk of falls: Patient Identification Verified: Yes Signs or symptoms of abuse/neglect since last visito No Secondary Verification Process Completed: Yes Hospitalized since last visit: No Patient Requires Transmission-Based Precautions: No Has Dressing in Place as Prescribed: Yes Patient Has Alerts: No Has Footwear/Offloading in Place as Prescribed: Yes Left: T Contact Cast otal Right: Wedge Shoe Pain Present Now: Yes Electronic Signature(s) Signed: 01/28/2021 5:27:52 PM By: Lorrin Jackson Entered By: Lorrin Jackson on 01/28/2021 09:42:14 -------------------------------------------------------------------------------- Encounter Discharge Information Details Patient Name: Date of Service: [name], [place]. 01/28/2021 9:30 A M Medical Record Number: [id number] Patient Account Number: [id number] Date of Birth/Sex: Treating RN: 1976-02-07 (45 y.o. [name] Primary Care Owenn Rothermel: [name] Other Clinician: Referring Kevonta Phariss: Treating Kaniya Trueheart/Extender: [name] in Treatment: 28 Encounter Discharge Information Items Post Procedure Vitals Discharge Condition: Stable Temperature (F): 98.2 Ambulatory  Status: Wheelchair Pulse (bpm): 101 Discharge Destination: Home Respiratory Rate (breaths/min): 20 Transportation: Private Auto Blood Pressure (mmHg): 135/85 Accompanied By: spouse Schedule Follow-up Appointment: Yes Clinical Summary of Care: Patient Declined Electronic Signature(s) Signed: 01/28/2021 5:41:44 PM By: Baruch Gouty RN, BSN Entered By: Baruch Gouty on 01/28/2021 12:11:32 -------------------------------------------------------------------------------- Lower Extremity Assessment Details Patient Name: Date of Service: [name], [place] 01/28/2021 9:30 A M Medical Record Number: [id number] Patient Account Number: [id number] Date of Birth/Sex: Treating RN: 1976-06-04 (45 y.o. [name] Primary Care Xzander Gilham: [name] Other Clinician: Referring Reality Dejonge: Treating Furman Trentman/Extender: [name] in Treatment: 28 Edema Assessment Assessed: [name]: Yes] [name]: Yes] Edema: [Left: No] [Right: No] Calf Left: Right: Point of Measurement: 29 cm From Medial Instep 43 cm 43 cm Ankle Left: Right: Point of Measurement: 9 cm From Medial Instep 25 cm 25 cm Vascular Assessment Pulses: Dorsalis Pedis Palpable: [Left:Yes] [Right:Yes] Electronic Signature(s) Signed: 01/28/2021 5:27:52 PM By: Lorrin Jackson Entered By: Lorrin Jackson on 01/28/2021 09:54:24 -------------------------------------------------------------------------------- Multi Wound Chart Details Patient Name: Date of Service: [name], [place] 01/28/2021 9:30 A M Medical Record Number: [id number] Patient Account Number: [id number] Date of Birth/Sex: Treating RN: Mar 13, 1976 (45 y.o. [name], [name] Primary Care Anthony Roland: [name] Other Clinician: Referring Lavonya Hoerner: Treating Lavontae Cornia/Extender: [name] in Treatment: 28 Vital Signs Height(in): 69 Pulse(bpm): 101 Weight(lbs): 280 Blood Pressure(mmHg): 135/85 Body Mass Index(BMI):  41 Temperature(F): 98.2 Respiratory Rate(breaths/min): 18 Photos: [1:No Photos Right Calcaneus] [2:No Photos Left Calcaneus] [N/A:N/A N/A] Wound Location: [1:Pressure Injury] [2:Pressure Injury] [N/A:N/A] Wounding Event: [1:Pressure Ulcer] [2:Pressure Ulcer] [N/A:N/A] Primary Etiology: [1:Asthma, Angina, Hypertension] [2:Asthma, Angina, Hypertension] [N/A:N/A] Comorbid History: [1:10/07/2019] [2:10/07/2019] [N/A:N/A] Date Acquired: [1:28] [2:28] [N/A:N/A] Weeks of Treatment: [1:Open] [2:Open] [N/A:N/A] Wound Status: [1:4x2x0.3] [2:4.5x1.6x0.4] [N/A:N/A] Measurements L x W x D (cm) [1:6.283] [2:5.655] [N/A:N/A] A (cm) : rea [1:1.885] [2:2.262] [N/A:N/A] Volume (cm) : [1:80.10%] [2:79.10%] [N/A:N/A] % Reduction in A rea: [1:94.00%] [2:91.70%] [N/A:N/A] % Reduction in  Volume: [1:Category/Stage III] [2:Category/Stage III] [N/A:N/A] Classification: [1:Medium] [2:Medium] [N/A:N/A] Exudate A mount: [1:N/A] [2:Serosanguineous] [N/A:N/A] Exudate Type: [1:N/A] [2:red, brown] [N/A:N/A] Exudate Color: [1:Distinct, outline attached] [2:Distinct, outline attached] [N/A:N/A] Wound Margin: [1:Medium (34-66%)] [2:Large (67-100%)] [N/A:N/A] Granulation A mount: [1:Red, Pink] [2:Pink] [N/A:N/A] Granulation Quality: [1:Medium (34-66%)] [2:Small (1-33%)] [N/A:N/A] Necrotic A mount: [1:Eschar] [2:Adherent Slough] [N/A:N/A] Necrotic Tissue: [1:Fat Layer (Subcutaneous Tissue): Yes Fat Layer (Subcutaneous Tissue): Yes N/A] Exposed Structures: [1:Fascia: No Tendon: No Muscle: No Joint: No Bone: No Medium (34-66%)] [2:Fascia: No Tendon: No Muscle: No Joint: No Bone: No Medium (34-66%)] [N/A:N/A] Epithelialization: [1:N/A] [2:Debridement - Excisional] [N/A:N/A] Debridement: Pre-procedure Verification/Time Out N/A [2:10:00] [N/A:N/A] Taken: [1:N/A] [2:Lidocaine 4% Topical Solution] [N/A:N/A] Pain Control: [1:N/A] [2:Callus, Subcutaneous, Slough] [N/A:N/A] Tissue Debrided: [1:N/A] [2:Skin/Subcutaneous Tissue]  [N/A:N/A] Level: [1:N/A] [2:9.6] [N/A:N/A] Debridement A (sq cm): [1:rea N/A] [2:Curette] [N/A:N/A] Instrument: [1:N/A] [2:Moderate] [N/A:N/A] Bleeding: [1:N/A] [2:Pressure] [N/A:N/A] Hemostasis A chieved: [1:N/A] [2:0] [N/A:N/A] Procedural Pain: [1:N/A] [2:0] [N/A:N/A] Post Procedural Pain: [1:N/A] [2:Procedure was tolerated well] [N/A:N/A] Debridement Treatment Response: [1:N/A] [2:4.5x1.6x0.4] [N/A:N/A] Post Debridement Measurements L x W x D (cm) [1:N/A] [2:2.262] [N/A:N/A] Post Debridement Volume: (cm) [1:N/A] [2:Category/Stage III] [N/A:N/A] Post Debridement Stage: [1:N/A] [2:Debridement] [N/A:N/A] Treatment Notes Electronic Signature(s) Signed: 01/28/2021 5:20:02 PM By: Linton Ham MD Signed: 01/28/2021 5:46:50 PM By: [name] Entered By: Linton Ham on 01/28/2021 10:15:46 -------------------------------------------------------------------------------- Multi-Disciplinary Care Plan Details Patient Name: Date of Service: [name], [place] 01/28/2021 9:30 A M Medical Record Number: [id number] Patient Account Number: [id number] Date of Birth/Sex: Treating RN: 04-14-1976 (45 y.o. [name] Primary Care Dmani Mizer: [name] Other Clinician: Referring Loghan Subia: Treating Hurshel Bouillon/Extender: [name] in Treatment: 28 Multidisciplinary Care Plan reviewed with physician Active Inactive Abuse / Safety / Falls / Self Care Management Nursing Diagnoses: Potential for falls Goals: Patient/caregiver will verbalize/demonstrate measures taken to prevent injury and/or falls Date Initiated: 07/15/2020 Target Resolution Date: 03/05/2021 Goal Status: Active Interventions: Assess fall risk on admission and as needed Assess impairment of mobility on admission and as needed per policy Notes: Electronic Signature(s) Signed: 01/28/2021 5:46:50 PM By: [name] Entered By: [name] on 01/28/2021  09:41:16 -------------------------------------------------------------------------------- Pain Assessment Details Patient Name: Date of Service: [name], [place]. 01/28/2021 9:30 A M Medical Record Number: [id number] Patient Account Number: [id number] Date of Birth/Sex: Treating RN: Dec 28, 1975 (45 y.o. [name] Primary Care Madelein Mahadeo: [name] Other Clinician: Referring Klaryssa Fauth: Treating Sadiya Durand/Extender: [name] in Treatment: 28 Active Problems Location of Pain Severity and Description of Pain Patient Has Paino Yes Site Locations Pain Location: Pain in Ulcers With Dressing Change: Yes Duration of the Pain. Constant / Intermittento Intermittent Rate the pain. Current Pain Level: 5 Character of Pain Describe the Pain: Aching, Throbbing Pain Management and Medication Current Pain Management: Medication: Yes Cold Application: No Rest: Yes Massage: No Activity: No T.E.N.S.: No Heat Application: No Leg drop or elevation: No Is the Current Pain Management Adequate: Inadequate How does your wound impact your activities of daily livingo Sleep: No Bathing: No Appetite: No Relationship With Others: No Bladder Continence: No Emotions: No Bowel Continence: No Work: No Toileting: No Drive: No Dressing: No Hobbies: No Electronic Signature(s) Signed: 01/28/2021 5:27:52 PM By: Lorrin Jackson Entered By: Lorrin Jackson on 01/28/2021 09:45:30 -------------------------------------------------------------------------------- Patient/Caregiver Education Details Patient Name: Date of Service: Ciro Backer 6/23/2022andnbsp9:30 A M Medical Record Number: [id number] Patient Account Number: [id number] Date of Birth/Gender: Treating RN: [birth year] (45 y.o. [name] Primary Care  Physician: [name] Other Clinician: Referring Physician: Treating Physician/Extender: [name] in Treatment: 28 Education  Assessment Education Provided To: Patient Education Topics Provided Wound/Skin Impairment: Handouts: Skin Care Do's and Dont's Methods: Explain/Verbal Responses: Reinforcements needed Electronic Signature(s) Signed: 01/28/2021 5:46:50 PM By: [name] Entered By: [name] on 01/28/2021 09:41:29 -------------------------------------------------------------------------------- Wound Assessment Details Patient Name: Date of Service: [name], [place] 01/28/2021 9:30 A M Medical Record Number: [id number] Patient Account Number: [id number] Date of Birth/Sex: Treating RN: 1975/08/24 (45 y.o. [name] Primary Care Cali Hope: [name] Other Clinician: Referring Julliana Whitmyer: Treating Contessa Preuss/Extender: [name] in Treatment: 28 Wound Status Wound Number: 1 Primary Etiology: Pressure Ulcer Wound Location: Right Calcaneus Wound Status: Open Wounding Event: Pressure Injury Comorbid History: Asthma, Angina, Hypertension Date Acquired: 10/07/2019 Weeks Of Treatment: 28 Clustered Wound: No Photos Wound Measurements Length: (cm) 4 Width: (cm) 2 Depth: (cm) 0.3 Area: (cm) 6.283 Volume: (cm) 1.885 % Reduction in Area: 80.1% % Reduction in Volume: 94% Epithelialization: Medium (34-66%) Tunneling: No Undermining: No Wound Description Classification: Category/Stage III Wound Margin: Distinct, outline attached Exudate Amount: Medium Foul Odor After Cleansing: No Slough/Fibrino No Wound Bed Granulation Amount: Medium (34-66%) Exposed Structure Granulation Quality: Red, Pink Fascia Exposed: No Necrotic Amount: Medium (34-66%) Fat Layer (Subcutaneous Tissue) Exposed: Yes Necrotic Quality: Eschar Tendon Exposed: No Muscle Exposed: No Joint Exposed: No Bone Exposed: No Treatment Notes Wound #1 (Calcaneus) Wound Laterality: Right Cleanser Normal Saline Discharge Instruction: Cleanse the wound with Normal Saline prior to applying a clean  dressing using gauze sponges, not tissue or cotton balls. Wound Cleanser Discharge Instruction: Cleanse the wound with wound cleanser prior to applying a clean dressing using gauze sponges, not tissue or cotton balls. Peri-Wound Care Topical Primary Dressing KerraCel Ag Gelling Fiber Dressing, 2x2 in (silver alginate) Discharge Instruction: ****Ensure to apply into divot area of wound.**** Apply silver alginate to wound bed as instructed Secondary Dressing Zetuvit Plus 4x8 in Discharge Instruction: Apply over primary dressing as directed. Secured With Elastic Bandage 4 inch (ACE bandage) Discharge Instruction: Secure with ACE bandage as directed. Kerlix Roll Sterile, 4.5x3.1 (in/yd) Discharge Instruction: Secure with Kerlix as directed. Compression Wrap Compression Stockings Add-Ons Electronic Signature(s) Signed: 02/01/2021 4:13:19 PM By: Sandre Kitty Signed: 02/01/2021 5:08:20 PM By: Lorrin Jackson Previous Signature: 01/28/2021 5:27:52 PM Version By: Lorrin Jackson Entered By: Sandre Kitty on 01/29/2021 16:30:57 -------------------------------------------------------------------------------- Wound Assessment Details Patient Name: Date of Service: [name], [place] 01/28/2021 9:30 A M Medical Record Number: [id number] Patient Account Number: [id number] Date of Birth/Sex: Treating RN: 1976/06/07 (45 y.o. [name] Primary Care Emaree Chiu: [name] Other Clinician: Referring Cambry Spampinato: Treating Joelly Bolanos/Extender: [name] in Treatment: 28 Wound Status Wound Number: 2 Primary Etiology: Pressure Ulcer Wound Location: Left Calcaneus Wound Status: Open Wounding Event: Pressure Injury Comorbid History: Asthma, Angina, Hypertension Date Acquired: 10/07/2019 Weeks Of Treatment: 28 Clustered Wound: No Photos Wound Measurements Length: (cm) 4.5 Width: (cm) 1.6 Depth: (cm) 0.4 Area: (cm) 5.655 Volume: (cm) 2.262 % Reduction in Area:  79.1% % Reduction in Volume: 91.7% Epithelialization: Medium (34-66%) Tunneling: No Undermining: No Wound Description Classification: Category/Stage III Wound Margin: Distinct, outline attached Exudate Amount: Medium Exudate Type: Serosanguineous Exudate Color: red, brown Foul Odor After Cleansing: No Slough/Fibrino Yes Wound Bed Granulation Amount: Large (67-100%) Exposed Structure Granulation Quality: Pink Fascia Exposed: No Necrotic Amount: Small (1-33%) Fat Layer (Subcutaneous Tissue) Exposed: Yes Necrotic Quality: Adherent Slough Tendon Exposed: No Muscle Exposed: No Joint  Exposed: No Bone Exposed: No Treatment Notes Wound #2 (Calcaneus) Wound Laterality: Left Cleanser Normal Saline Discharge Instruction: Cleanse the wound with Normal Saline prior to applying a clean dressing using gauze sponges, not tissue or cotton balls. Wound Cleanser Discharge Instruction: Cleanse the wound with wound cleanser prior to applying a clean dressing using gauze sponges, not tissue or cotton balls. Peri-Wound Care Topical Primary Dressing KerraCel Ag Gelling Fiber Dressing, 2x2 in (silver alginate) Discharge Instruction: ****Ensure to apply into divot area of wound.**** Apply silver alginate to wound bed as instructed Secondary Dressing Zetuvit Plus 4x8 in Discharge Instruction: Apply over primary dressing as directed. Secured With Elastic Bandage 4 inch (ACE bandage) Discharge Instruction: Secure with ACE bandage as directed. Kerlix Roll Sterile, 4.5x3.1 (in/yd) Discharge Instruction: Secure with Kerlix as directed. Compression Wrap Compression Stockings Add-Ons Electronic Signature(s) Signed: 02/01/2021 4:13:19 PM By: Sandre Kitty Signed: 02/01/2021 5:08:20 PM By: Lorrin Jackson Previous Signature: 01/28/2021 5:27:52 PM Version By: Lorrin Jackson Entered By: Sandre Kitty on 01/29/2021  16:31:13 -------------------------------------------------------------------------------- Vitals Details Patient Name: Date of Service: [name], [place] 01/28/2021 9:30 A M Medical Record Number: [id number] Patient Account Number: [id number] Date of Birth/Sex: Treating RN: 1976/04/14 (45 y.o. [name] Primary Care Torianne Laflam: [name] Other Clinician: Referring Jalasia Eskridge: Treating Saron Vanorman/Extender: [name] in Treatment: 28 Vital Signs Time Taken: 09:42 Temperature (F): 98.2 Height (in): 69 Pulse (bpm): 101 Weight (lbs): 280 Respiratory Rate (breaths/min): 18 Body Mass Index (BMI): 41.3 Blood Pressure (mmHg): 135/85 Reference Range: 80 - 120 mg / dl Electronic Signature(s) Signed: 01/28/2021 5:27:52 PM By: Lorrin Jackson Entered By: Lorrin Jackson on 01/28/2021 09:44:35

## 2021-01-29 DIAGNOSIS — S91309A Unspecified open wound, unspecified foot, initial encounter: Secondary | ICD-10-CM | POA: Diagnosis not present

## 2021-02-04 ENCOUNTER — Other Ambulatory Visit: Payer: Self-pay

## 2021-02-04 ENCOUNTER — Encounter (HOSPITAL_BASED_OUTPATIENT_CLINIC_OR_DEPARTMENT_OTHER): Payer: BC Managed Care – PPO | Admitting: Internal Medicine

## 2021-02-04 DIAGNOSIS — L97512 Non-pressure chronic ulcer of other part of right foot with fat layer exposed: Secondary | ICD-10-CM | POA: Diagnosis not present

## 2021-02-04 DIAGNOSIS — L97522 Non-pressure chronic ulcer of other 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: Secondary | ICD-10-CM | POA: Diagnosis not present

## 2021-02-04 DIAGNOSIS — L89620 Pressure ulcer of left heel, unstageable: Secondary | ICD-10-CM | POA: Diagnosis not present

## 2021-02-04 DIAGNOSIS — L89610 Pressure ulcer of right heel, unstageable: Secondary | ICD-10-CM | POA: Diagnosis not present

## 2021-02-04 DIAGNOSIS — L89893 Pressure ulcer of other site, stage 3: Secondary | ICD-10-CM | POA: Diagnosis not present

## 2021-02-04 NOTE — Progress Notes (Signed)
[name] ([id number]) Visit Report for 02/04/2021 HPI Details Patient Name: Date of Service: [name], [place] 02/04/2021 10:30 A M Medical Record Number: [id number] Patient Account Number: [id number] Date of Birth/Sex: Treating RN: [birth year] (45 y.o. [name] Primary Care Provider: [name] Other Clinician: Referring Provider: Treating Provider/Extender: [name] in Treatment: 29 History of Present Illness HPI Description: 07/15/2020 upon evaluation today patient presents for initial inspection here in our clinic concerning issues he has been having with the bottoms of his feet bilaterally. He states these actually occurred as wounds when he was hospitalized for 5 months secondary to Covid. He was apparently with tilting bed where he was in an upright position quite frequently and apparently this occurred in some way shape or form during that time. Fortunately there is no sign of active infection at this time. No fevers, chills, nausea, vomiting, or diarrhea. With that being said he still has substantial wounds on the plantar aspects of his feet Theragen require quite a bit of work to get these to heal. He has been using Santyl currently though that is been problematic both in receiving the medication as well as actually paid for it as it is become quite expensive. Prior to the experience with Covid the patient really did not have any major medical problems other than hypertension he does have some mild generalized weakness following the Covid experience. 07/22/2020 on evaluation today patient appears to be doing okay in regard to his foot ulcers I feel like the wound beds are showing signs of better improvement that I do believe the Iodoflex is helping in this regard. With that being said he does have a lot of drainage currently and this is somewhat blue/green in nature which is consistent with Pseudomonas. I do think a culture today would be  appropriate for [place] to evaluate and see if that is indeed the case I would likely start him on antibiotic orally as well he is not allergic to Cipro knows of no issues he has had in the past 12/21; patient was admitted to the clinic earlier this month with bilateral presumed pressure ulcers on the bottom of his feet apparently related to excessive pressure from a tilt table arrangement in the intensive care unit. Patient relates this to being on ECMO but I am not really sure that is exactly related to that. I must say I have never seen anything like this. He has fairly extensive full-thickness wounds extending from his heel towards his midfoot mostly centered laterally. There is already been some healing distally. He does not appear to have an arterial issue. He has been using gentamicin to the wound surfaces with Iodoflex to help with ongoing debridement 1/6; this is a patient with pressure ulcers on the bottom of his feet related to excessive pressure from a standing position in the intensive care unit. He is complaining of a lot of pain in the right heel. He is not a diabetic. 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 We have been using Iodoflex to help prepare the surfaces of both wounds for an advanced treatment product. He is nonambulatory spending most of his time in a wheelchair I have asked him not to propel the wheelchair with his heels 1/13; in general his wounds look better not much surface area change we have been using Iodoflex as of last week. I did an x-ray of the right heel as the patient was complaining of pain. I  had some thoughts about a stress fracture perhaps Achilles tendon problems however what it showed was erosive changes along the inferior aspect of the calcaneus he now has a MRI booked for 1/20. 1/20; in general his wounds continue to be better. Some improvement in the large narrow areas proximally in his foot. He is still complaining of pain in the  right heel and tenderness in certain areas of this wound. His MRI is tonight. I am not just looking for osteomyelitis that was brought up on the x-ray I am wondering about stress fractures, tendon ruptures etc. He has no such findings on the left. Also noteworthy is that the patient had critical illness neuropathy and some of the discomfort may be actual improvement in nerve function I am just not sure. These wounds were initially in the setting of severe critical illness related to COVID-19. He was put in a standing position. He may have also been on pressors at the point contributing to tissue ischemia. By his description at some point these wounds were grossly necrotic extending proximally up into the Achilles part of his heel. I do not know that I have ever really seen pictures of them like this although they may exist in epic We have ordered Tri layer Oasis. I am trying to stimulate some granulation in these areas. This is of course assuming the MRI is negative for infection 1/27; since the patient was last here he saw Dr. Juleen China of infectious disease. He is planned for vancomycin and ceftriaxone. Prior operative culture grew MSSA. Also ordered baseline lab work. He also ordered arterial studies although the ABIs in our clinic were normal as well as his clinical exam these were normal I do not think he needs to see vascular surgery. His ABIs at the PTA were 1.22 in the right triphasic waveforms with a normal TBI of 1.15 on the left ABI of 1.22 with triphasic waveforms and a normal TBI of 1.08. Finally he saw Dr. Amalia Hailey who will follow him in for 2 months. At this point I do not think he felt that he needed a procedure on the right calcaneal bone. Dr. Juleen China is elected for broad-spectrum antibiotic The patient is still having pain in the right heel. He walks with a walker 2/3; wounds are generally smaller. He is tolerating his IV antibiotics. I believe this is vancomycin and ceftriaxone. We are  still waiting for Oasis burn in terms of his out-of-pocket max which he should be meeting soon given the IV antibiotics, MRIs etc. I have asked him to check in on this. We are using silver collagen in the meantime the wounds look better 2/10; tolerating IV vancomycin and Rocephin. We are waiting to apply for Oasis. Although I am not really sure where he is in his out-of-pocket max. 2/17 started the first application of Oasis trilayer. Still on antibiotics. The wounds have generally look better. The area on the left has a little more surface slough requiring debridement 7/67; second application of Oasis trilayer. The wound surface granulation is generally look better. The area on the left with undermining laterally I think is come in a bit. 10/08/2020 upon evaluation today patient is here today for [employer] application #3. Fortunately he seems to be doing extremely well with regard to this and we are seeing a lot of new epithelial growth which is great news. Fortunately there is no signs of active infection at this time. 10/16/2020 upon evaluation today patient appears to be doing well with regard  to his foot ulcers. Do believe the Oasis has been of benefit for him. I do not see any signs of infection right now which is great news and I think that he has a lot of new epithelial growth which is great to see as well. The patient is very pleased to hear all of this. I do think we can proceed with the Oasis trilayer #4 today. 3/18; not as much improvement in these areas on his heels that I was hoping. I did reapply trilateral Oasis today the tissue looks healthier but not as much fill in as I was hoping. 3/25; better looking today I think this is come in a bit the tissue looks healthier. Triple layer Oasis reapplied #6 4/1; somewhat better looking definitely better looking surface not as much change in surface area as I was hoping. He may be spending more time [place] on days then he needs to although  he does have heel offloading boots. Triple layer Oasis reapplied #7 4/7; unfortunately apparently [facility] will not approve any further Oasis which is unfortunate since the patient did respond nicely both in terms of the condition of the wound bed as well as surface area. There is still some drainage coming from the wound but not a lot there does not appear to be any infection 4/15; we have been using Hydrofera Blue. He continues to have nice rims of epithelialization on the right greater than the left. The left the epithelialization is coming from the tip of his heel. There is moderate drainage. In this that concerns me about a total contact cast. There is no evidence of infection 4/29; patient has been using Hydrofera Blue with dressing changes. He has no complaints or issues today. 5/5; using Hydrofera Blue. I actually think that he looks marginally better than the last time I saw this 3 weeks ago. There are rims of epithelialization on the left thumb coming from the medial side on the right. Using Hydrofera Blue 5/12; using Hydrofera Blue. These continue to make improvements in surface area. His drainage was not listed as severe I therefore went ahead and put a cast on the left foot. Right foot we will continue to dress his previous 5/16; back for first total contact cast change. He did not tolerate this particularly well cast injury on the anterior tibia among other issues. Difficulty sleeping. I talked him about this in some detail and afterwards is elected to continue. I told him I would like to have a cast on for 3 weeks to see if this is going to help at all. I think he agreed 5/19; I think the wound is better. There is no tunneling towards his midfoot. The undermining medially also looks better. He has a rim of new skin distally. I think we are making progress here. The area on the left also continues to look somewhat better to me using Hydrofera Blue. He has a list of  complaints about the cast but none of them seem serious 5/26; patient presents for 1 week follow-up. He has been using a total contact cast and tolerating this well. Hydrofera Blue is the main dressing used. He denies signs of infection. 6/2 Hydrofera Blue total contact cast on the left. These were large ulcers that formed in intensive care unit where the patient was recovering from [place]. May have had something to do with being ventilated in an upright positiono Pressors etc. We have been able to get the areas down considerably and a  viable surface. There is some epithelialization in both sides. Note made of drainage 6/9; changed to [facility] last time because of drainage. He arrives with better looking surfaces and dimensions on the left than the right. Paradoxically the right actually probes more towards his midfoot the left is largely close down but both of these look improved. Using a total contact cast on the left 6/16; complex wounds on his bilateral plantar heels which were initially pressure injury from a stay in the ICU with COVID. We have been using silver alginate most recently. His dimensions of come in quite dramatically however not recently. We have been putting the left foot in a total contact cast 6/23; complex wounds on the bilateral plantar heels. I been putting the left in the cast paradoxically the area on the right is the one that is going towards closure at a faster rate. Quite a bit of drainage on the left. The patient went to see Dr. Amalia Hailey who said he was going to standby for skin grafts. I had actually considered sending him for skin grafts however he would be mandatorily off his feet for a period of weeks to months. I am thinking that the area on the right is going to close on its own the area on the left has been more stubborn even though we have him in a total contact cast 6/30; took him out of a total contact cast last week is the right heel seem to be making better  progress than the left where I was placing the cast. We are using silver alginate. Both wounds are smaller right greater than left Electronic Signature(s) Signed: 02/04/2021 5:19:12 PM By: Linton Ham MD Entered By: Linton Ham on 02/04/2021 12:29:31 -------------------------------------------------------------------------------- Physical Exam Details Patient Name: Date of Service: [name], [place] 02/04/2021 10:30 A M Medical Record Number: [id number] Patient Account Number: [id number] Date of Birth/Sex: Treating RN: 01/04/1976 (45 y.o. [name] Primary Care Provider: [name] Other Clinician: Referring Provider: Treating Provider/Extender: [name] in Treatment: 29 Constitutional Patient is hypertensive.. Pulse regular and within target range for patient.Marland Kitchen Respirations regular, non-labored and within target range.. Temperature is normal and within the target range for the patient.Marland Kitchen Appears in no distress. Notes Surface ofWound exam; bilateral heel wounds. The right continues to look better. Epithelializing. On the left there is also some improvement in general. Neither side required any debridement there is no evidence of infection. On the right there is still some undermining superiorly towards the midfoot this is not identified on the left Electronic Signature(s) Signed: 02/04/2021 5:19:12 PM By: Linton Ham MD Entered By: Linton Ham on 02/04/2021 12:30:28 -------------------------------------------------------------------------------- Physician Orders Details Patient Name: Date of Service: [name], [place] 02/04/2021 10:30 A M Medical Record Number: [id number] Patient Account Number: [id number] Date of Birth/Sex: Treating RN: Mar 03, 1976 (45 y.o. [name], [name] Primary Care Provider: [name] Other Clinician: Referring Provider: Treating Provider/Extender: [name] in Treatment: 29 Verbal /  Phone Orders: No Diagnosis Coding ICD-10 Coding Code Description [phone] Pressure ulcer of other site, stage 3 L97.512 Non-pressure chronic ulcer of other part of right foot with fat layer exposed L97.522 Non-pressure chronic ulcer of other 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 Follow-up Appointments ppointment in 2 weeks. - Thursday Dr. Dellia Nims Return A Bathing/ Shower/ Hygiene May shower with protection but do not get wound dressing(s) wet. Edema Control - Lymphedema / SCD / Other Bilateral Lower Extremities Elevate legs  to the level of the heart or above for 30 minutes daily and/or when sitting, a frequency of: Avoid standing for long periods of time. Moisturize legs daily. - right leg and foot every night. Off-Loading Wedge shoe to: - Glophed Shoe to both feet. Other: - keep pressure off of the bottom of your feet Additional Orders / Instructions Follow Nutritious Diet Wound Treatment Wound #1 - Calcaneus Wound Laterality: Right Cleanser: Normal Saline (Generic) Every Other Day/30 Days Discharge Instructions: Cleanse the wound with Normal Saline prior to applying a clean dressing using gauze sponges, not tissue or cotton balls. Cleanser: Wound Cleanser Every Other Day/30 Days Discharge Instructions: Cleanse the wound with wound cleanser prior to applying a clean dressing using gauze sponges, not tissue or cotton balls. Prim Dressing: KerraCel Ag Gelling Fiber Dressing, 2x2 in (silver alginate) (Generic) Every Other Day/30 Days ary Discharge Instructions: ****Ensure to apply into divot area of wound.**** Apply silver alginate to wound bed as instructed Secondary Dressing: Zetuvit Plus 4x8 in (Generic) Every Other Day/30 Days Discharge Instructions: Apply over primary dressing as directed. Secured With: Elastic Bandage 4 inch (ACE bandage) Every Other Day/30 Days Discharge Instructions: Secure with ACE bandage as directed. Secured With: [employer], 4.5x3.1 (in/yd)  (Generic) Every Other Day/30 Days Discharge Instructions: Secure with Kerlix as directed. Wound #2 - Calcaneus Wound Laterality: Left Cleanser: Normal Saline (Generic) Every Other Day/30 Days Discharge Instructions: Cleanse the wound with Normal Saline prior to applying a clean dressing using gauze sponges, not tissue or cotton balls. Cleanser: Wound Cleanser Every Other Day/30 Days Discharge Instructions: Cleanse the wound with wound cleanser prior to applying a clean dressing using gauze sponges, not tissue or cotton balls. Prim Dressing: KerraCel Ag Gelling Fiber Dressing, 2x2 in (silver alginate) (Generic) Every Other Day/30 Days ary Discharge Instructions: ****Ensure to apply into divot area of wound.**** Apply silver alginate to wound bed as instructed Secondary Dressing: Zetuvit Plus 4x8 in (Generic) Every Other Day/30 Days Discharge Instructions: Apply over primary dressing as directed. Secured With: Elastic Bandage 4 inch (ACE bandage) Every Other Day/30 Days Discharge Instructions: Secure with ACE bandage as directed. Secured With: [employer], 4.5x3.1 (in/yd) (Generic) Every Other Day/30 Days Discharge Instructions: Secure with Kerlix as directed. Electronic Signature(s) Signed: 02/04/2021 5:19:12 PM By: Linton Ham MD Signed: 02/04/2021 6:57:58 PM By: [name] Entered By: [name] on 02/04/2021 11:43:28 -------------------------------------------------------------------------------- Problem List Details Patient Name: Date of Service: [name], [place] 02/04/2021 10:30 A M Medical Record Number: [id number] Patient Account Number: [id number] Date of Birth/Sex: Treating RN: 1975/11/28 (45 y.o. [name] Primary Care Provider: [name] Other Clinician: Referring Provider: Treating Provider/Extender: [name] in Treatment: 29 Active Problems ICD-10 Encounter Code Description Active Date MDM Diagnosis [phone] Pressure  ulcer of other site, stage 3 07/15/2020 No Yes L97.512 Non-pressure chronic ulcer of other part of right foot with fat layer exposed 09/03/2020 No Yes L97.522 Non-pressure chronic ulcer of other 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 09/03/2020 No Yes Inactive Problems ICD-10 Code Description Active Date Inactive Date M62.81 Muscle weakness (generalized) 07/15/2020 07/15/2020 I10 Essential (primary) hypertension 07/15/2020 07/15/2020 M86.171 Other acute osteomyelitis, right ankle and foot 09/03/2020 09/03/2020 Resolved Problems Electronic Signature(s) Signed: 02/04/2021 5:19:12 PM By: Linton Ham MD Entered By: Linton Ham on 02/04/2021 12:28:49 -------------------------------------------------------------------------------- Progress Note Details Patient Name: Date of Service: [name], [place] 02/04/2021 10:30 A M Medical Record Number: [id number] Patient Account Number: [id number] Date of Birth/Sex: Treating  RN: August 29, 1975 (45 y.o. [name] Primary Care Provider: [name] Other Clinician: Referring Provider: Treating Provider/Extender: [name] in Treatment: 29 Subjective History of Present Illness (HPI) 07/15/2020 upon evaluation today patient presents for initial inspection here in our clinic concerning issues he has been having with the bottoms of his feet bilaterally. He states these actually occurred as wounds when he was hospitalized for 5 months secondary to Covid. He was apparently with tilting bed where he was in an upright position quite frequently and apparently this occurred in some way shape or form during that time. Fortunately there is no sign of active infection at this time. No fevers, chills, nausea, vomiting, or diarrhea. With that being said he still has substantial wounds on the plantar aspects of his feet Theragen require quite a bit of work to get these to heal. He has been using Santyl currently though that is been problematic  both in receiving the medication as well as actually paid for it as it is become quite expensive. Prior to the experience with Covid the patient really did not have any major medical problems other than hypertension he does have some mild generalized weakness following the Covid experience. 07/22/2020 on evaluation today patient appears to be doing okay in regard to his foot ulcers I feel like the wound beds are showing signs of better improvement that I do believe the Iodoflex is helping in this regard. With that being said he does have a lot of drainage currently and this is somewhat blue/green in nature which is consistent with Pseudomonas. I do think a culture today would be appropriate for [place] to evaluate and see if that is indeed the case I would likely start him on antibiotic orally as well he is not allergic to Cipro knows of no issues he has had in the past 12/21; patient was admitted to the clinic earlier this month with bilateral presumed pressure ulcers on the bottom of his feet apparently related to excessive pressure from a tilt table arrangement in the intensive care unit. Patient relates this to being on ECMO but I am not really sure that is exactly related to that. I must say I have never seen anything like this. He has fairly extensive full-thickness wounds extending from his heel towards his midfoot mostly centered laterally. There is already been some healing distally. He does not appear to have an arterial issue. He has been using gentamicin to the wound surfaces with Iodoflex to help with ongoing debridement 1/6; this is a patient with pressure ulcers on the bottom of his feet related to excessive pressure from a standing position in the intensive care unit. He is complaining of a lot of pain in the right heel. He is not a diabetic. 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 We have been using Iodoflex to help prepare the surfaces of both wounds for an  advanced treatment product. He is nonambulatory spending most of his time in a wheelchair I have asked him not to propel the wheelchair with his heels 1/13; in general his wounds look better not much surface area change we have been using Iodoflex as of last week. I did an x-ray of the right heel as the patient was complaining of pain. I had some thoughts about a stress fracture perhaps Achilles tendon problems however what it showed was erosive changes along the inferior aspect of the calcaneus he now has a MRI booked for 1/20. 1/20; in  general his wounds continue to be better. Some improvement in the large narrow areas proximally in his foot. He is still complaining of pain in the right heel and tenderness in certain areas of this wound. His MRI is tonight. I am not just looking for osteomyelitis that was brought up on the x-ray I am wondering about stress fractures, tendon ruptures etc. He has no such findings on the left. Also noteworthy is that the patient had critical illness neuropathy and some of the discomfort may be actual improvement in nerve function I am just not sure. These wounds were initially in the setting of severe critical illness related to COVID-19. He was put in a standing position. He may have also been on pressors at the point contributing to tissue ischemia. By his description at some point these wounds were grossly necrotic extending proximally up into the Achilles part of his heel. I do not know that I have ever really seen pictures of them like this although they may exist in epic We have ordered Tri layer Oasis. I am trying to stimulate some granulation in these areas. This is of course assuming the MRI is negative for infection 1/27; since the patient was last here he saw Dr. Juleen China of infectious disease. He is planned for vancomycin and ceftriaxone. Prior operative culture grew MSSA. Also ordered baseline lab work. He also ordered arterial studies although the ABIs in  our clinic were normal as well as his clinical exam these were normal I do not think he needs to see vascular surgery. His ABIs at the PTA were 1.22 in the right triphasic waveforms with a normal TBI of 1.15 on the left ABI of 1.22 with triphasic waveforms and a normal TBI of 1.08. Finally he saw Dr. Amalia Hailey who will follow him in for 2 months. At this point I do not think he felt that he needed a procedure on the right calcaneal bone. Dr. Juleen China is elected for broad-spectrum antibiotic The patient is still having pain in the right heel. He walks with a walker 2/3; wounds are generally smaller. He is tolerating his IV antibiotics. I believe this is vancomycin and ceftriaxone. We are still waiting for Oasis burn in terms of his out-of-pocket max which he should be meeting soon given the IV antibiotics, MRIs etc. I have asked him to check in on this. We are using silver collagen in the meantime the wounds look better 2/10; tolerating IV vancomycin and Rocephin. We are waiting to apply for Oasis. Although I am not really sure where he is in his out-of-pocket max. 2/17 started the first application of Oasis trilayer. Still on antibiotics. The wounds have generally look better. The area on the left has a little more surface slough requiring debridement 5/91; second application of Oasis trilayer. The wound surface granulation is generally look better. The area on the left with undermining laterally I think is come in a bit. 10/08/2020 upon evaluation today patient is here today for [employer] application #3. Fortunately he seems to be doing extremely well with regard to this and we are seeing a lot of new epithelial growth which is great news. Fortunately there is no signs of active infection at this time. 10/16/2020 upon evaluation today patient appears to be doing well with regard to his foot ulcers. Do believe the Oasis has been of benefit for him. I do not see any signs of infection right now which is  great news and I think that he has a  lot of new epithelial growth which is great to see as well. The patient is very pleased to hear all of this. I do think we can proceed with the Oasis trilayer #4 today. 3/18; not as much improvement in these areas on his heels that I was hoping. I did reapply trilateral Oasis today the tissue looks healthier but not as much fill in as I was hoping. 3/25; better looking today I think this is come in a bit the tissue looks healthier. Triple layer Oasis reapplied #6 4/1; somewhat better looking definitely better looking surface not as much change in surface area as I was hoping. He may be spending more time [place] on days then he needs to although he does have heel offloading boots. Triple layer Oasis reapplied #7 4/7; unfortunately apparently [facility] will not approve any further Oasis which is unfortunate since the patient did respond nicely both in terms of the condition of the wound bed as well as surface area. There is still some drainage coming from the wound but not a lot there does not appear to be any infection 4/15; we have been using Hydrofera Blue. He continues to have nice rims of epithelialization on the right greater than the left. The left the epithelialization is coming from the tip of his heel. There is moderate drainage. In this that concerns me about a total contact cast. There is no evidence of infection 4/29; patient has been using Hydrofera Blue with dressing changes. He has no complaints or issues today. 5/5; using Hydrofera Blue. I actually think that he looks marginally better than the last time I saw this 3 weeks ago. There are rims of epithelialization on the left thumb coming from the medial side on the right. Using Hydrofera Blue 5/12; using Hydrofera Blue. These continue to make improvements in surface area. His drainage was not listed as severe I therefore went ahead and put a cast on the left foot. Right foot we will  continue to dress his previous 5/16; back for first total contact cast change. He did not tolerate this particularly well cast injury on the anterior tibia among other issues. Difficulty sleeping. I talked him about this in some detail and afterwards is elected to continue. I told him I would like to have a cast on for 3 weeks to see if this is going to help at all. I think he agreed 5/19; I think the wound is better. There is no tunneling towards his midfoot. The undermining medially also looks better. He has a rim of new skin distally. I think we are making progress here. The area on the left also continues to look somewhat better to me using Hydrofera Blue. He has a list of complaints about the cast but none of them seem serious 5/26; patient presents for 1 week follow-up. He has been using a total contact cast and tolerating this well. Hydrofera Blue is the main dressing used. He denies signs of infection. 6/2 Hydrofera Blue total contact cast on the left. These were large ulcers that formed in intensive care unit where the patient was recovering from [place]. May have had something to do with being ventilated in an upright positiono Pressors etc. We have been able to get the areas down considerably and a viable surface. There is some epithelialization in both sides. Note made of drainage 6/9; changed to [facility] last time because of drainage. He arrives with better looking surfaces and dimensions on the left than the  right. Paradoxically the right actually probes more towards his midfoot the left is largely close down but both of these look improved. Using a total contact cast on the left 6/16; complex wounds on his bilateral plantar heels which were initially pressure injury from a stay in the ICU with COVID. We have been using silver alginate most recently. His dimensions of come in quite dramatically however not recently. We have been putting the left foot in a total contact cast 6/23;  complex wounds on the bilateral plantar heels. I been putting the left in the cast paradoxically the area on the right is the one that is going towards closure at a faster rate. Quite a bit of drainage on the left. The patient went to see Dr. Amalia Hailey who said he was going to [facility] for skin grafts. I had actually considered sending him for skin grafts however he would be mandatorily off his feet for a period of weeks to months. I am thinking that the area on the right is going to close on its own the area on the left has been more stubborn even though we have him in a total contact cast 6/30; took him out of a total contact cast last week is the right heel seem to be making better progress than the left where I was placing the cast. We are using silver alginate. Both wounds are smaller right greater than left Objective Constitutional Patient is hypertensive.. Pulse regular and within target range for patient.Marland Kitchen Respirations regular, non-labored and within target range.. Temperature is normal and within the target range for the patient.Marland Kitchen Appears in no distress. Vitals Time Taken: 10:56 AM, Height: 69 in, Source: Stated, Weight: 280 lbs, Source: Stated, BMI: 41.3, Temperature: 98.6 F, Pulse: 101 bpm, Respiratory Rate: 18 breaths/min, Blood Pressure: 145/79 mmHg. General Notes: Surface ofWound exam; bilateral heel wounds. The right continues to look better. Epithelializing. On the left there is also some improvement in general. Neither side required any debridement there is no evidence of infection. oo On the right there is still some undermining superiorly towards the midfoot this is not identified on the left Integumentary (Hair, Skin) Wound #1 status is Open. Original cause of wound was Pressure Injury. The date acquired was: 10/07/2019. The wound has been in treatment 29 weeks. The wound is located on the Right Calcaneus. The wound measures 2.7cm length x 0.9cm width x 0.2cm depth; 1.909cm^2 area  and 0.382cm^3 volume. There is Fat Layer (Subcutaneous Tissue) exposed. There is no tunneling or undermining noted. There is a medium amount of serosanguineous drainage noted. The wound margin is distinct with the outline attached to the wound base. There is large (67-100%) red, pink, pale granulation within the wound bed. There is a small (1-33%) amount of necrotic tissue within the wound bed including Adherent Slough. Wound #2 status is Open. Original cause of wound was Pressure Injury. The date acquired was: 10/07/2019. The wound has been in treatment 29 weeks. The wound is located on the Left Calcaneus. The wound measures 4.5cm length x 1.3cm width x 0.6cm depth; 4.595cm^2 area and 2.757cm^3 volume. There is Fat Layer (Subcutaneous Tissue) exposed. There is no tunneling or undermining noted. There is a medium amount of serosanguineous drainage noted. The wound margin is thickened. There is large (67-100%) pink granulation within the wound bed. There is a small (1-33%) amount of necrotic tissue within the wound bed including Adherent Slough. Assessment Active Problems ICD-10 Pressure ulcer of other site, stage 3 Non-pressure chronic ulcer of  other part of right foot with fat layer exposed Non-pressure chronic ulcer of other 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 Plan Follow-up Appointments: Return Appointment in 2 weeks. - Thursday Dr. Dellia Nims Bathing/ Shower/ Hygiene: May shower with protection but do not get wound dressing(s) wet. Edema Control - Lymphedema / SCD / Other: Elevate legs to the level of the heart or above for 30 minutes daily and/or when sitting, a frequency of: Avoid standing for long periods of time. Moisturize legs daily. - right leg and foot every night. Off-Loading: Wedge shoe to: - Glophed Shoe to both feet. Other: - keep pressure off of the bottom of your feet Additional Orders / Instructions: Follow Nutritious Diet WOUND #1: - Calcaneus Wound Laterality:  Right Cleanser: Normal Saline (Generic) Every Other Day/30 Days Discharge Instructions: Cleanse the wound with Normal Saline prior to applying a clean dressing using gauze sponges, not tissue or cotton balls. Cleanser: Wound Cleanser Every Other Day/30 Days Discharge Instructions: Cleanse the wound with wound cleanser prior to applying a clean dressing using gauze sponges, not tissue or cotton balls. Prim Dressing: KerraCel Ag Gelling Fiber Dressing, 2x2 in (silver alginate) (Generic) Every Other Day/30 Days ary Discharge Instructions: ****Ensure to apply into divot area of wound.**** Apply silver alginate to wound bed as instructed Secondary Dressing: Zetuvit Plus 4x8 in (Generic) Every Other Day/30 Days Discharge Instructions: Apply over primary dressing as directed. Secured With: Elastic Bandage 4 inch (ACE bandage) Every Other Day/30 Days Discharge Instructions: Secure with ACE bandage as directed. Secured With: [employer], 4.5x3.1 (in/yd) (Generic) Every Other Day/30 Days Discharge Instructions: Secure with Kerlix as directed. WOUND #2: - Calcaneus Wound Laterality: Left Cleanser: Normal Saline (Generic) Every Other Day/30 Days Discharge Instructions: Cleanse the wound with Normal Saline prior to applying a clean dressing using gauze sponges, not tissue or cotton balls. Cleanser: Wound Cleanser Every Other Day/30 Days Discharge Instructions: Cleanse the wound with wound cleanser prior to applying a clean dressing using gauze sponges, not tissue or cotton balls. Prim Dressing: KerraCel Ag Gelling Fiber Dressing, 2x2 in (silver alginate) (Generic) Every Other Day/30 Days ary Discharge Instructions: ****Ensure to apply into divot area of wound.**** Apply silver alginate to wound bed as instructed Secondary Dressing: Zetuvit Plus 4x8 in (Generic) Every Other Day/30 Days Discharge Instructions: Apply over primary dressing as directed. Secured With: Elastic Bandage 4 inch (ACE  bandage) Every Other Day/30 Days Discharge Instructions: Secure with ACE bandage as directed. Secured With: [employer], 4.5x3.1 (in/yd) (Generic) Every Other Day/30 Days Discharge Instructions: Secure with Kerlix as directed. 1. I continue with the silver alginate in both wounds with Zetuvid. 2. He appears to be making progress. Major option being plastic surgery 3. I was told him out of the cast on the left he seemed to be making better progress this week without it Electronic Signature(s) Signed: 02/04/2021 5:19:12 PM By: Linton Ham MD Entered By: Linton Ham on 02/04/2021 12:35:04 -------------------------------------------------------------------------------- SuperBill Details Patient Name: Date of Service: [name], [place]. 02/04/2021 Medical Record Number: [id number] Patient Account Number: [id number] Date of Birth/Sex: Treating RN: 1976/06/05 (45 y.o. [name] Primary Care Provider: [name] Other Clinician: Referring Provider: Treating Provider/Extender: [name] in Treatment: 29 Diagnosis Coding ICD-10 Codes Code Description [phone] Pressure ulcer of other site, stage 3 L97.512 Non-pressure chronic ulcer of other part of right foot with fat layer exposed L97.522 Non-pressure chronic ulcer of other 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 Facility  Procedures CPT4 Code: [id number] Description: 05183 - WOUND CARE VISIT-LEV 5 EST PT Modifier: Quantity: 1 Physician Procedures Electronic Signature(s) Signed: 02/04/2021 5:19:12 PM By: Linton Ham MD Entered By: Linton Ham on 02/04/2021 12:35:29

## 2021-02-06 DIAGNOSIS — G4734 Idiopathic sleep related nonobstructive alveolar hypoventilation: Secondary | ICD-10-CM | POA: Diagnosis not present

## 2021-02-06 DIAGNOSIS — U071 COVID-19: Secondary | ICD-10-CM | POA: Diagnosis not present

## 2021-02-09 NOTE — Progress Notes (Signed)
[name], [name] ([id number]) Visit Report for 02/04/2021 Arrival Information Details Patient Name: Date of Service: [name], [name] 02/04/2021 10:30 A M Medical Record Number: [id number] Patient Account Number: [id number] Date of Birth/Sex: Treating RN: 01/25/1976 (44 y.o. [name] Primary Care [name]: [name] Other Clinician: Referring [name]: Treating [name]/Extender: [name] in Treatment: 29 Visit Information History Since Last Visit Added or deleted any medications: No Patient Arrived: Wheel Chair Any new allergies or adverse reactions: No Arrival Time: 10:54 Had a fall or experienced change in No Accompanied By: son activities of daily living that may affect Transfer Assistance: None risk of falls: Patient Identification Verified: Yes Signs or symptoms of abuse/neglect since last visito No Secondary Verification Process Completed: Yes Hospitalized since last visit: No Patient Requires Transmission-Based Precautions: No Implantable device outside of the clinic excluding No Patient Has Alerts: No cellular tissue based products placed in the center since last visit: Has Dressing in Place as Prescribed: Yes Has Footwear/Offloading in Place as Prescribed: Yes Left: Other:globoped Right: Other:globoped Pain Present Now: Yes Electronic Signature(s) Signed: 02/04/2021 6:41:14 PM By: [name] Gouty RN, BSN Entered By: [name] on 02/04/2021 10:56:31 -------------------------------------------------------------------------------- Clinic Level of Care Assessment Details Patient Name: Date of Service: [name], [name] E. 02/04/2021 10:30 A M Medical Record Number: [id number] Patient Account Number: [id number] Date of Birth/Sex: Treating RN: [birth year] (45 y.o. [name], [name] Primary Care Marie [name]: [name] Other Clinician: Referring [name]: Treating [name]: [name] in Treatment:  29 Clinic Level of Care Assessment Items TOOL 4 Quantity Score X- 1 0 Use when only an EandM is performed on FOLLOW-UP visit ASSESSMENTS - Nursing Assessment / Reassessment X- 1 10 Reassessment of Co-morbidities (includes updates in patient status) X- 1 5 Reassessment of Adherence to Treatment Plan ASSESSMENTS - Wound and Skin A ssessment / Reassessment []  - 0 Simple Wound Assessment / Reassessment - one wound X- 2 5 Complex Wound Assessment / Reassessment - multiple wounds X- 1 10 Dermatologic / Skin Assessment (not related to wound area) ASSESSMENTS - Focused Assessment X- 2 5 Circumferential Edema Measurements - multi extremities X- 1 10 Nutritional Assessment / Counseling / Intervention []  - 0 Lower Extremity Assessment (monofilament, tuning fork, pulses) []  - 0 Peripheral Arterial Disease Assessment (using hand held doppler) ASSESSMENTS - Ostomy and/or Continence Assessment and Care []  - 0 Incontinence Assessment and Management []  - 0 Ostomy Care Assessment and Management (repouching, etc.) PROCESS - Coordination of Care []  - 0 Simple Patient / Family Education for ongoing care X- 1 20 Complex (extensive) Patient / Family Education for ongoing care X- 1 10 Staff obtains Programmer, systems, Records, T Results / Process Orders est []  - 0 Staff telephones HHA, Nursing Homes / Clarify orders / etc []  - 0 Routine Transfer to another Facility (non-emergent condition) []  - 0 Routine Hospital Admission (non-emergent condition) []  - 0 New Admissions / Biomedical engineer / Ordering NPWT Apligraf, etc. , []  - 0 Emergency Hospital Admission (emergent condition) []  - 0 Simple Discharge Coordination X- 1 15 Complex (extensive) Discharge Coordination PROCESS - Special Needs []  - 0 Pediatric / Minor Patient Management []  - 0 Isolation Patient Management []  - 0 Hearing / Language / Visual special needs []  - 0 Assessment of Community assistance (transportation, D/C  planning, etc.) []  - 0 Additional assistance / Altered mentation []  - 0 Support Surface(s) Assessment (bed, cushion, seat, etc.) INTERVENTIONS - Wound Cleansing / Measurement []  - 0 Simple Wound  Cleansing - one wound X- 2 5 Complex Wound Cleansing - multiple wounds X- 1 5 Wound Imaging (photographs - any number of wounds) []  - 0 Wound Tracing (instead of photographs) []  - 0 Simple Wound Measurement - one wound X- 2 5 Complex Wound Measurement - multiple wounds INTERVENTIONS - Wound Dressings []  - 0 Small Wound Dressing one or multiple wounds X- 2 15 Medium Wound Dressing one or multiple wounds []  - 0 Large Wound Dressing one or multiple wounds []  - 0 Application of Medications - topical []  - 0 Application of Medications - injection INTERVENTIONS - Miscellaneous []  - 0 External ear exam []  - 0 Specimen Collection (cultures, biopsies, blood, body fluids, etc.) []  - 0 Specimen(s) / Culture(s) sent or taken to Lab for analysis []  - 0 Patient Transfer (multiple staff / Civil Service fast streamer / Similar devices) []  - 0 Simple Staple / Suture removal (25 or less) []  - 0 Complex Staple / Suture removal (26 or more) []  - 0 Hypo / Hyperglycemic Management (close monitor of Blood Glucose) []  - 0 Ankle / Brachial Index (ABI) - do not check if billed separately X- 1 5 Vital Signs Has the patient been seen at the hospital within the last three years: Yes Total Score: 160 Level Of Care: New/Established - Level 5 Electronic Signature(s) Signed: 02/04/2021 6:57:58 PM By: [name] Entered By: [name] on 02/04/2021 11:44:22 -------------------------------------------------------------------------------- Encounter Discharge Information Details Patient Name: Date of Service: [name], [name] 02/04/2021 10:30 A M Medical Record Number: [id number] Patient Account Number: [id number] Date of Birth/Sex: Treating RN: 08/01/1976 (45 y.o. [name] Primary Care Tiari Andringa: [name] Other Clinician: Referring Burhanuddin Kohlmann: Treating Thersea [name]/Extender: [name] in Treatment: 29 Encounter Discharge Information Items Discharge Condition: Stable Ambulatory Status: Ambulatory Discharge Destination: Home Transportation: Private Auto Accompanied By: son Schedule Follow-up Appointment: No Clinical Summary of Care: Patient Declined Electronic Signature(s) Signed: 02/09/2021 6:17:43 PM By: Leane Call Entered By: Leane Call on 02/04/2021 12:56:12 -------------------------------------------------------------------------------- Lower Extremity Assessment Details Patient Name: Date of Service: [name], [place] 02/04/2021 10:30 A M Medical Record Number: [id number] Patient Account Number: [id number] Date of Birth/Sex: Treating RN: 11-04-1975 (45 y.o. [name] Primary Care Elvira Langston: [name] Other Clinician: Referring Betheny Suchecki: Treating Sharnetta Gielow/Extender: [name] in Treatment: 29 Edema Assessment Assessed: [name]: No] [name]: No] Edema: [Left: No] [Right: No] Calf Left: Right: Point of Measurement: 29 cm From Medial Instep 43.5 cm 43.3 cm Ankle Left: Right: Point of Measurement: 9 cm From Medial Instep 26.7 cm 25.5 cm Vascular Assessment Pulses: Dorsalis Pedis Palpable: [Left:Yes] [Right:Yes] Electronic Signature(s) Signed: 02/04/2021 6:41:14 PM By: [name] Gouty RN, BSN Entered By: [name] on 02/04/2021 11:09:19 -------------------------------------------------------------------------------- Multi Wound Chart Details Patient Name: Date of Service: [name], [place] 02/04/2021 10:30 A M Medical Record Number: [id number] Patient Account Number: [id number] Date of Birth/Sex: Treating RN: Sep 30, 1975 (45 y.o. [name] Primary Care Tyller Bowlby: [name] Other Clinician: Referring Cleatus Gabriel: Treating Brielynn Sekula/Extender: [name] in Treatment:  29 Vital Signs Height(in): 69 Pulse(bpm): 101 Weight(lbs): 280 Blood Pressure(mmHg): 145/79 Body Mass Index(BMI): 41 Temperature(F): 98.6 Respiratory Rate(breaths/min): 18 Photos: [1:No Photos Right Calcaneus] [2:No Photos Left Calcaneus] [N/A:N/A N/A] Wound Location: [1:Pressure Injury] [2:Pressure Injury] [N/A:N/A] Wounding Event: [1:Pressure Ulcer] [2:Pressure Ulcer] [N/A:N/A] Primary Etiology: [1:Asthma, Angina, Hypertension] [2:Asthma, Angina, Hypertension] [N/A:N/A] Comorbid History: [1:10/07/2019] [2:10/07/2019] [N/A:N/A] Date Acquired: [1:29] [2:29] [N/A:N/A] Weeks of Treatment: [1:Open] [2:Open] [N/A:N/A] Wound Status: [1:2.7x0.9x0.2] [2:4.5x1.3x0.6] [  N/A:N/A] Measurements L x W x D (cm) [1:1.909] [2:4.595] [N/A:N/A] A (cm) : rea [1:0.382] [2:2.757] [N/A:N/A] Volume (cm) : [1:94.00%] [2:83.00%] [N/A:N/A] % Reduction in A rea: [1:98.80%] [2:89.80%] [N/A:N/A] % Reduction in Volume: [1:Category/Stage III] [2:Category/Stage III] [N/A:N/A] Classification: [1:Medium] [2:Medium] [N/A:N/A] Exudate A mount: [1:Serosanguineous] [2:Serosanguineous] [N/A:N/A] Exudate Type: [1:red, brown] [2:red, brown] [N/A:N/A] Exudate Color: [1:Distinct, outline attached] [2:Thickened] [N/A:N/A] Wound Margin: [1:Large (67-100%)] [2:Large (67-100%)] [N/A:N/A] Granulation A mount: [1:Red, Pink, Pale] [2:Pink] [N/A:N/A] Granulation Quality: [1:Small (1-33%)] [2:Small (1-33%)] [N/A:N/A] Necrotic A mount: [1:Fat Layer (Subcutaneous Tissue): Yes Fat Layer (Subcutaneous Tissue): Yes N/A] Exposed Structures: [1:Fascia: No Tendon: No Muscle: No Joint: No Bone: No Medium (34-66%)] [2:Fascia: No Tendon: No Muscle: No Joint: No Bone: No Small (1-33%)] [N/A:N/A] Treatment Notes Electronic Signature(s) Signed: 02/04/2021 5:19:12 PM By: Linton Ham MD Signed: 02/04/2021 6:57:58 PM By: [name] Entered By: Linton Ham on 02/04/2021  12:28:58 -------------------------------------------------------------------------------- Multi-Disciplinary Care Plan Details Patient Name: Date of Service: [name], [name] 02/04/2021 10:30 A M Medical Record Number: [id number] Patient Account Number: [id number] Date of Birth/Sex: Treating RN: May 23, 1976 (45 y.o. [name] Primary Care Ajdin Macke: [name] Other Clinician: Referring Connelly Spruell: Treating Naraya Stoneberg/Extender: [name] in Treatment: 29 Multidisciplinary Care Plan reviewed with physician Active Inactive Abuse / Safety / Falls / Self Care Management Nursing Diagnoses: Potential for falls Goals: Patient/caregiver will verbalize/demonstrate measures taken to prevent injury and/or falls Date Initiated: 07/15/2020 Target Resolution Date: 03/05/2021 Goal Status: Active Interventions: Assess fall risk on admission and as needed Assess impairment of mobility on admission and as needed per policy Notes: Electronic Signature(s) Signed: 02/04/2021 6:57:58 PM By: [name] Entered By: [name] on 02/04/2021 10:37:25 -------------------------------------------------------------------------------- Pain Assessment Details Patient Name: Date of Service: [name], [place] E. 02/04/2021 10:30 A M Medical Record Number: [id number] Patient Account Number: [id number] Date of Birth/Sex: Treating RN: 05-30-1976 (45 y.o. [name] Primary Care Onnika Siebel: [name] Other Clinician: Referring Jameeka Marcy: Treating Lennell Shanks/Extender: [name] in Treatment: 29 Active Problems Location of Pain Severity and Description of Pain Patient Has Paino Yes Site Locations Pain Location: Pain Location: Pain in Ulcers With Dressing Change: Yes Duration of the Pain. Constant / Intermittento Intermittent Rate the pain. Current Pain Level: 1 Worst Pain Level: 5 Least Pain Level: 0 Character of Pain Describe the Pain:  Aching Pain Management and Medication Current Pain Management: Medication: Yes Is the Current Pain Management Adequate: Adequate Rest: Yes How does your wound impact your activities of daily livingo Sleep: No Bathing: No Appetite: No Relationship With Others: No Bladder Continence: No Emotions: No Bowel Continence: No Work: No Toileting: No Drive: No Dressing: No Hobbies: No Electronic Signature(s) Signed: 02/04/2021 6:41:14 PM By: [name] Gouty RN, BSN Entered By: [name] on 02/04/2021 10:59:02 -------------------------------------------------------------------------------- Patient/Caregiver Education Details Patient Name: Date of Service: [name] 6/30/2022andnbsp10:30 A M Medical Record Number: [id number] Patient Account Number: [id number] Date of Birth/Gender: Treating RN: [birth year] (45 y.o. [name] Primary Care Physician: [name] Other Clinician: Referring Physician: Treating Physician/Extender: [name] in Treatment: 29 Education Assessment Education Provided To: Patient Education Topics Provided Wound/Skin Impairment: Handouts: Skin Care Do's and Dont's Methods: Explain/Verbal Responses: Reinforcements needed Electronic Signature(s) Signed: 02/04/2021 6:57:58 PM By: [name] Entered By: [name] on 02/04/2021 10:37:38 -------------------------------------------------------------------------------- Wound Assessment Details Patient Name: Date of Service: [name], [place] 02/04/2021 10:30 A M Medical Record Number: [id number] Patient Account Number: [id number] Date  of Birth/Sex: Treating RN: [birth year] (45 y.o. [name] Primary Care Wardell Pokorski: [name] Other Clinician: Referring Ahmadou Bolz: Treating Earnest Thalman/Extender: [name] in Treatment: 29 Wound Status Wound Number: 1 Primary Etiology: Pressure Ulcer Wound Location: Right Calcaneus Wound Status:  Open Wounding Event: Pressure Injury Comorbid History: Asthma, Angina, Hypertension Date Acquired: 10/07/2019 Weeks Of Treatment: 29 Clustered Wound: No Photos Wound Measurements Length: (cm) 2.7 Width: (cm) 0.9 Depth: (cm) 0.2 Area: (cm) 1.909 Volume: (cm) 0.382 % Reduction in Area: 94% % Reduction in Volume: 98.8% Epithelialization: Medium (34-66%) Tunneling: No Undermining: No Wound Description Classification: Category/Stage III Wound Margin: Distinct, outline attached Exudate Amount: Medium Exudate Type: Serosanguineous Exudate Color: red, brown Foul Odor After Cleansing: No Slough/Fibrino No Wound Bed Granulation Amount: Large (67-100%) Exposed Structure Granulation Quality: Red, Pink, Pale Fascia Exposed: No Necrotic Amount: Small (1-33%) Fat Layer (Subcutaneous Tissue) Exposed: Yes Necrotic Quality: Adherent Slough Tendon Exposed: No Muscle Exposed: No Joint Exposed: No Bone Exposed: No Treatment Notes Wound #1 (Calcaneus) Wound Laterality: Right Cleanser Normal Saline Discharge Instruction: Cleanse the wound with Normal Saline prior to applying a clean dressing using gauze sponges, not tissue or cotton balls. Wound Cleanser Discharge Instruction: Cleanse the wound with wound cleanser prior to applying a clean dressing using gauze sponges, not tissue or cotton balls. Peri-Wound Care Topical Primary Dressing KerraCel Ag Gelling Fiber Dressing, 2x2 in (silver alginate) Discharge Instruction: ****Ensure to apply into divot area of wound.**** Apply silver alginate to wound bed as instructed Secondary Dressing Zetuvit Plus 4x8 in Discharge Instruction: Apply over primary dressing as directed. Secured With Elastic Bandage 4 inch (ACE bandage) Discharge Instruction: Secure with ACE bandage as directed. Kerlix Roll Sterile, 4.5x3.1 (in/yd) Discharge Instruction: Secure with Kerlix as directed. Compression Wrap Compression Stockings Add-Ons Electronic  Signature(s) Signed: 02/05/2021 10:25:49 AM By: Sandre Kitty Signed: 02/05/2021 1:24:03 PM By: [name] Gouty RN, BSN Previous Signature: 02/04/2021 6:41:14 PM Version By: [name] Gouty RN, BSN Entered By: Sandre Kitty on 02/05/2021 09:52:54 -------------------------------------------------------------------------------- Wound Assessment Details Patient Name: Date of Service: [name], [place] 02/04/2021 10:30 A M Medical Record Number: [id number] Patient Account Number: [id number] Date of Birth/Sex: Treating RN: [birth year] (45 y.o. [name] Primary Care Maryjo Ragon: [name] Other Clinician: Referring Tru Leopard: Treating Gennie Eisinger/Extender: [name] in Treatment: 29 Wound Status Wound Number: 2 Primary Etiology: Pressure Ulcer Wound Location: Left Calcaneus Wound Status: Open Wounding Event: Pressure Injury Comorbid History: Asthma, Angina, Hypertension Date Acquired: 10/07/2019 Weeks Of Treatment: 29 Clustered Wound: No Photos Wound Measurements Length: (cm) 4.5 Width: (cm) 1.3 Depth: (cm) 0.6 Area: (cm) 4.595 Volume: (cm) 2.757 % Reduction in Area: 83% % Reduction in Volume: 89.8% Epithelialization: Small (1-33%) Tunneling: No Undermining: No Wound Description Classification: Category/Stage III Wound Margin: Thickened Exudate Amount: Medium Exudate Type: Serosanguineous Exudate Color: red, brown Foul Odor After Cleansing: No Slough/Fibrino Yes Wound Bed Granulation Amount: Large (67-100%) Exposed Structure Granulation Quality: Pink Fascia Exposed: No Necrotic Amount: Small (1-33%) Fat Layer (Subcutaneous Tissue) Exposed: Yes Necrotic Quality: Adherent Slough Tendon Exposed: No Muscle Exposed: No Joint Exposed: No Bone Exposed: No Treatment Notes Wound #2 (Calcaneus) Wound Laterality: Left Cleanser Normal Saline Discharge Instruction: Cleanse the wound with Normal Saline prior to applying a clean dressing using  gauze sponges, not tissue or cotton balls. Wound Cleanser Discharge Instruction: Cleanse the wound with wound cleanser prior to applying a clean dressing using gauze sponges, not tissue or cotton balls. Peri-Wound Care Topical Primary Dressing KerraCel Ag Gelling Fiber  Dressing, 2x2 in (silver alginate) Discharge Instruction: ****Ensure to apply into divot area of wound.**** Apply silver alginate to wound bed as instructed Secondary Dressing Zetuvit Plus 4x8 in Discharge Instruction: Apply over primary dressing as directed. Secured With Elastic Bandage 4 inch (ACE bandage) Discharge Instruction: Secure with ACE bandage as directed. Kerlix Roll Sterile, 4.5x3.1 (in/yd) Discharge Instruction: Secure with Kerlix as directed. Compression Wrap Compression Stockings Add-Ons Electronic Signature(s) Signed: 02/05/2021 10:25:49 AM By: Sandre Kitty Signed: 02/05/2021 1:24:03 PM By: [name] Gouty RN, BSN Previous Signature: 02/04/2021 6:41:14 PM Version By: [name] Gouty RN, BSN Entered By: Sandre Kitty on 02/05/2021 09:53:19 -------------------------------------------------------------------------------- Vitals Details Patient Name: Date of Service: [name], [place] 02/04/2021 10:30 A M Medical Record Number: [id number] Patient Account Number: [id number] Date of Birth/Sex: Treating RN: June 12, 1976 (45 y.o. [name] Primary Care Livingston Denner: [name] Other Clinician: Referring Avett Reineck: Treating Mike Berntsen/Extender: [name] in Treatment: 29 Vital Signs Time Taken: 10:56 Temperature (F): 98.6 Height (in): 69 Pulse (bpm): 101 Source: Stated Respiratory Rate (breaths/min): 18 Weight (lbs): 280 Blood Pressure (mmHg): 145/79 Source: Stated Reference Range: 80 - 120 mg / dl Body Mass Index (BMI): 41.3 Electronic Signature(s) Signed: 02/04/2021 6:41:14 PM By: [name] Gouty RN, BSN Entered By: [name] on 02/04/2021 10:58:13

## 2021-02-10 DIAGNOSIS — S91309A Unspecified open wound, unspecified foot, initial encounter: Secondary | ICD-10-CM | POA: Diagnosis not present

## 2021-02-10 DIAGNOSIS — M6281 Muscle weakness (generalized): Secondary | ICD-10-CM | POA: Diagnosis not present

## 2021-02-13 DIAGNOSIS — Z741 Need for assistance with personal care: Secondary | ICD-10-CM | POA: Diagnosis not present

## 2021-02-13 DIAGNOSIS — M6259 Muscle wasting and atrophy, not elsewhere classified, multiple sites: Secondary | ICD-10-CM | POA: Diagnosis not present

## 2021-02-13 DIAGNOSIS — M6281 Muscle weakness (generalized): Secondary | ICD-10-CM | POA: Diagnosis not present

## 2021-02-13 DIAGNOSIS — G7281 Critical illness myopathy: Secondary | ICD-10-CM | POA: Diagnosis not present

## 2021-02-16 ENCOUNTER — Encounter (HOSPITAL_BASED_OUTPATIENT_CLINIC_OR_DEPARTMENT_OTHER): Payer: BC Managed Care – PPO | Attending: Internal Medicine | Admitting: Internal Medicine

## 2021-02-16 ENCOUNTER — Other Ambulatory Visit: Payer: Self-pay

## 2021-02-16 DIAGNOSIS — L89610 Pressure ulcer of right heel, unstageable: Secondary | ICD-10-CM | POA: Diagnosis not present

## 2021-02-16 DIAGNOSIS — L89893 Pressure ulcer of other site, stage 3: Secondary | ICD-10-CM | POA: Insufficient documentation

## 2021-02-16 DIAGNOSIS — L97522 Non-pressure chronic ulcer of other 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: Secondary | ICD-10-CM | POA: Insufficient documentation

## 2021-02-16 DIAGNOSIS — L89620 Pressure ulcer of left heel, unstageable: Secondary | ICD-10-CM | POA: Diagnosis not present

## 2021-02-16 DIAGNOSIS — L97512 Non-pressure chronic ulcer of other part of right foot with fat layer exposed: Secondary | ICD-10-CM | POA: Insufficient documentation

## 2021-02-17 NOTE — Progress Notes (Signed)
OLAF, MESA ([id number]) Visit Report for 02/16/2021 HPI Details Patient Name: Date of Service: [name], [place] 02/16/2021 10:30 A M Medical Record Number: [id number] Patient Account Number: [id number] Date of Birth/Sex: Treating RN: 02-22-1976 (45 y.o. [name] Primary Care Provider: [name] Other Clinician: Referring Provider: Treating Provider/Extender: [name] in Treatment: 30 History of Present Illness HPI Description: 07/15/2020 upon evaluation today patient presents for initial inspection here in our clinic concerning issues he has been having with the bottoms of his feet bilaterally. He states these actually occurred as wounds when he was hospitalized for 5 months secondary to Covid. He was apparently with tilting bed where he was in an upright position quite frequently and apparently this occurred in some way shape or form during that time. Fortunately there is no sign of active infection at this time. No fevers, chills, nausea, vomiting, or diarrhea. With that being said he still has substantial wounds on the plantar aspects of his feet Theragen require quite a bit of work to get these to heal. He has been using Santyl currently though that is been problematic both in receiving the medication as well as actually paid for it as it is become quite expensive. Prior to the experience with Covid the patient really did not have any major medical problems other than hypertension he does have some mild generalized weakness following the Covid experience. 07/22/2020 on evaluation today patient appears to be doing okay in regard to his foot ulcers I feel like the wound beds are showing signs of better improvement that I do believe the Iodoflex is helping in this regard. With that being said he does have a lot of drainage currently and this is somewhat blue/green in nature which is consistent with Pseudomonas. I do think a culture today would be  appropriate for [place] to evaluate and see if that is indeed the case I would likely start him on antibiotic orally as well he is not allergic to Cipro knows of no issues he has had in the past 12/21; patient was admitted to the clinic earlier this month with bilateral presumed pressure ulcers on the bottom of his feet apparently related to excessive pressure from a tilt table arrangement in the intensive care unit. Patient relates this to being on ECMO but I am not really sure that is exactly related to that. I must say I have never seen anything like this. He has fairly extensive full-thickness wounds extending from his heel towards his midfoot mostly centered laterally. There is already been some healing distally. He does not appear to have an arterial issue. He has been using gentamicin to the wound surfaces with Iodoflex to help with ongoing debridement 1/6; this is a patient with pressure ulcers on the bottom of his feet related to excessive pressure from a standing position in the intensive care unit. He is complaining of a lot of pain in the right heel. He is not a diabetic. 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 We have been using Iodoflex to help prepare the surfaces of both wounds for an advanced treatment product. He is nonambulatory spending most of his time in a wheelchair I have asked him not to propel the wheelchair with his heels 1/13; in general his wounds look better not much surface area change we have been using Iodoflex as of last week. I did an x-ray of the right heel as the patient was complaining of pain. I  had some thoughts about a stress fracture perhaps Achilles tendon problems however what it showed was erosive changes along the inferior aspect of the calcaneus he now has a MRI booked for 1/20. 1/20; in general his wounds continue to be better. Some improvement in the large narrow areas proximally in his foot. He is still complaining of pain in the  right heel and tenderness in certain areas of this wound. His MRI is tonight. I am not just looking for osteomyelitis that was brought up on the x-ray I am wondering about stress fractures, tendon ruptures etc. He has no such findings on the left. Also noteworthy is that the patient had critical illness neuropathy and some of the discomfort may be actual improvement in nerve function I am just not sure. These wounds were initially in the setting of severe critical illness related to COVID-19. He was put in a standing position. He may have also been on pressors at the point contributing to tissue ischemia. By his description at some point these wounds were grossly necrotic extending proximally up into the Achilles part of his heel. I do not know that I have ever really seen pictures of them like this although they may exist in epic We have ordered Tri layer Oasis. I am trying to stimulate some granulation in these areas. This is of course assuming the MRI is negative for infection 1/27; since the patient was last here he saw Dr. Juleen China of infectious disease. He is planned for vancomycin and ceftriaxone. Prior operative culture grew MSSA. Also ordered baseline lab work. He also ordered arterial studies although the ABIs in our clinic were normal as well as his clinical exam these were normal I do not think he needs to see vascular surgery. His ABIs at the PTA were 1.22 in the right triphasic waveforms with a normal TBI of 1.15 on the left ABI of 1.22 with triphasic waveforms and a normal TBI of 1.08. Finally he saw Dr. Amalia Hailey who will follow him in for 2 months. At this point I do not think he felt that he needed a procedure on the right calcaneal bone. Dr. Juleen China is elected for broad-spectrum antibiotic The patient is still having pain in the right heel. He walks with a walker 2/3; wounds are generally smaller. He is tolerating his IV antibiotics. I believe this is vancomycin and ceftriaxone. We are  still waiting for Oasis burn in terms of his out-of-pocket max which he should be meeting soon given the IV antibiotics, MRIs etc. I have asked him to check in on this. We are using silver collagen in the meantime the wounds look better 2/10; tolerating IV vancomycin and Rocephin. We are waiting to apply for Oasis. Although I am not really sure where he is in his out-of-pocket max. 2/17 started the first application of Oasis trilayer. Still on antibiotics. The wounds have generally look better. The area on the left has a little more surface slough requiring debridement 1/49; second application of Oasis trilayer. The wound surface granulation is generally look better. The area on the left with undermining laterally I think is come in a bit. 10/08/2020 upon evaluation today patient is here today for [employer] application #3. Fortunately he seems to be doing extremely well with regard to this and we are seeing a lot of new epithelial growth which is great news. Fortunately there is no signs of active infection at this time. 10/16/2020 upon evaluation today patient appears to be doing well with regard  to his foot ulcers. Do believe the Oasis has been of benefit for him. I do not see any signs of infection right now which is great news and I think that he has a lot of new epithelial growth which is great to see as well. The patient is very pleased to hear all of this. I do think we can proceed with the Oasis trilayer #4 today. 3/18; not as much improvement in these areas on his heels that I was hoping. I did reapply trilateral Oasis today the tissue looks healthier but not as much fill in as I was hoping. 3/25; better looking today I think this is come in a bit the tissue looks healthier. Triple layer Oasis reapplied #6 4/1; somewhat better looking definitely better looking surface not as much change in surface area as I was hoping. He may be spending more time [place] on days then he needs to although  he does have heel offloading boots. Triple layer Oasis reapplied #7 4/7; unfortunately apparently [facility] will not approve any further Oasis which is unfortunate since the patient did respond nicely both in terms of the condition of the wound bed as well as surface area. There is still some drainage coming from the wound but not a lot there does not appear to be any infection 4/15; we have been using Hydrofera Blue. He continues to have nice rims of epithelialization on the right greater than the left. The left the epithelialization is coming from the tip of his heel. There is moderate drainage. In this that concerns me about a total contact cast. There is no evidence of infection 4/29; patient has been using Hydrofera Blue with dressing changes. He has no complaints or issues today. 5/5; using Hydrofera Blue. I actually think that he looks marginally better than the last time I saw this 3 weeks ago. There are rims of epithelialization on the left thumb coming from the medial side on the right. Using Hydrofera Blue 5/12; using Hydrofera Blue. These continue to make improvements in surface area. His drainage was not listed as severe I therefore went ahead and put a cast on the left foot. Right foot we will continue to dress his previous 5/16; back for first total contact cast change. He did not tolerate this particularly well cast injury on the anterior tibia among other issues. Difficulty sleeping. I talked him about this in some detail and afterwards is elected to continue. I told him I would like to have a cast on for 3 weeks to see if this is going to help at all. I think he agreed 5/19; I think the wound is better. There is no tunneling towards his midfoot. The undermining medially also looks better. He has a rim of new skin distally. I think we are making progress here. The area on the left also continues to look somewhat better to me using Hydrofera Blue. He has a list of  complaints about the cast but none of them seem serious 5/26; patient presents for 1 week follow-up. He has been using a total contact cast and tolerating this well. Hydrofera Blue is the main dressing used. He denies signs of infection. 6/2 Hydrofera Blue total contact cast on the left. These were large ulcers that formed in intensive care unit where the patient was recovering from [facility]. May have had something to do with being ventilated in an upright positiono Pressors etc. We have been able to get the areas down considerably and a  viable surface. There is some epithelialization in both sides. Note made of drainage 6/9; changed to [facility] last time because of drainage. He arrives with better looking surfaces and dimensions on the left than the right. Paradoxically the right actually probes more towards his midfoot the left is largely close down but both of these look improved. Using a total contact cast on the left 6/16; complex wounds on his bilateral plantar heels which were initially pressure injury from a stay in the ICU with COVID. We have been using silver alginate most recently. His dimensions of come in quite dramatically however not recently. We have been putting the left foot in a total contact cast 6/23; complex wounds on the bilateral plantar heels. I been putting the left in the cast paradoxically the area on the right is the one that is going towards closure at a faster rate. Quite a bit of drainage on the left. The patient went to see Dr. Amalia Hailey who said he was going to standby for skin grafts. I had actually considered sending him for skin grafts however he would be mandatorily off his feet for a period of weeks to months. I am thinking that the area on the right is going to close on its own the area on the left has been more stubborn even though we have him in a total contact cast 6/30; took him out of a total contact cast last week is the right heel seem to be making better  progress than the left where I was placing the cast. We are using silver alginate. Both wounds are smaller right greater than left 7/12; both wounds look as though they are making some progress. We are using silver alginate. Heel offloading boots Electronic Signature(s) Signed: 02/16/2021 5:25:56 PM By: Linton Ham MD Entered By: Linton Ham on 02/16/2021 12:11:47 -------------------------------------------------------------------------------- Physical Exam Details Patient Name: Date of Service: [name], [place] 02/16/2021 10:30 A M Medical Record Number: [id number] Patient Account Number: [id number] Date of Birth/Sex: Treating RN: 17-Jul-1976 (45 y.o. [name] Primary Care Provider: [name] Other Clinician: Referring Provider: Treating Provider/Extender: [name] in Treatment: 30 Constitutional Sitting or standing Blood Pressure is within target range for patient.. Pulse regular and within target range for patient.Marland Kitchen Respirations regular, non-labored and within target range.. Temperature is normal and within the target range for the patient.Marland Kitchen Appears in no distress. Notes Wound exam; bilateral plantar heels. I think they continue to contract. The right still has the area pointing towards the midfoot neither side require debridement at this point. No evidence of infection no exposed bone Electronic Signature(s) Signed: 02/16/2021 5:25:56 PM By: Linton Ham MD Entered By: Linton Ham on 02/16/2021 12:15:46 -------------------------------------------------------------------------------- Physician Orders Details Patient Name: Date of Service: [name], [place] 02/16/2021 10:30 A M Medical Record Number: [id number] Patient Account Number: [id number] Date of Birth/Sex: Treating RN: [birth year] (45 y.o. [name], [name] Primary Care Provider: [name] Other Clinician: Referring Provider: Treating Provider/Extender: [name] in Treatment: 30 Verbal / Phone Orders: No Diagnosis Coding ICD-10 Coding Code Description [phone] Non-pressure chronic ulcer of other part of right foot with fat layer exposed L89.893 Pressure ulcer of other site, stage 3 L97.522 Non-pressure chronic ulcer of other 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 Follow-up Appointments ppointment in 2 weeks. - on Thursday Dr. Dellia Nims!!!! Return A Bathing/ Shower/ Hygiene May shower with protection but do not get wound dressing(s) wet. Edema Control -  Lymphedema / SCD / Other Bilateral Lower Extremities Elevate legs to the level of the heart or above for 30 minutes daily and/or when sitting, a frequency of: Avoid standing for long periods of time. Moisturize legs daily. - right leg and foot every night. Off-Loading Wedge shoe to: - Glophed Shoe to both feet. Other: - keep pressure off of the bottom of your feet Additional Orders / Instructions Follow Nutritious Diet Wound Treatment Wound #1 - Calcaneus Wound Laterality: Right Cleanser: Normal Saline (Generic) Every Other Day/30 Days Discharge Instructions: Cleanse the wound with Normal Saline prior to applying a clean dressing using gauze sponges, not tissue or cotton balls. Cleanser: Wound Cleanser Every Other Day/30 Days Discharge Instructions: Cleanse the wound with wound cleanser prior to applying a clean dressing using gauze sponges, not tissue or cotton balls. Prim Dressing: KerraCel Ag Gelling Fiber Dressing, 2x2 in (silver alginate) (Generic) Every Other Day/30 Days ary Discharge Instructions: ****Ensure to apply into divot area of wound.**** Apply silver alginate to wound bed as instructed Secondary Dressing: Zetuvit Plus 4x8 in (Generic) Every Other Day/30 Days Discharge Instructions: Apply over primary dressing as directed. Secured With: Elastic Bandage 4 inch (ACE bandage) Every Other Day/30 Days Discharge Instructions: Secure with ACE bandage as  directed. Secured With: [employer], 4.5x3.1 (in/yd) (Generic) Every Other Day/30 Days Discharge Instructions: Secure with Kerlix as directed. Wound #2 - Calcaneus Wound Laterality: Left Cleanser: Normal Saline (Generic) Every Other Day/30 Days Discharge Instructions: Cleanse the wound with Normal Saline prior to applying a clean dressing using gauze sponges, not tissue or cotton balls. Cleanser: Wound Cleanser Every Other Day/30 Days Discharge Instructions: Cleanse the wound with wound cleanser prior to applying a clean dressing using gauze sponges, not tissue or cotton balls. Prim Dressing: KerraCel Ag Gelling Fiber Dressing, 2x2 in (silver alginate) (Generic) Every Other Day/30 Days ary Discharge Instructions: ****Ensure to apply into divot area of wound.**** Apply silver alginate to wound bed as instructed Secondary Dressing: Zetuvit Plus 4x8 in (Generic) Every Other Day/30 Days Discharge Instructions: Apply over primary dressing as directed. Secured With: Elastic Bandage 4 inch (ACE bandage) Every Other Day/30 Days Discharge Instructions: Secure with ACE bandage as directed. Secured With: [employer], 4.5x3.1 (in/yd) (Generic) Every Other Day/30 Days Discharge Instructions: Secure with Kerlix as directed. Electronic Signature(s) Signed: 02/16/2021 5:25:56 PM By: Linton Ham MD Signed: 02/17/2021 6:12:45 PM By: [name] RN Entered By: [name] on 02/16/2021 11:43:51 -------------------------------------------------------------------------------- Problem List Details Patient Name: Date of Service: [name], [place] 02/16/2021 10:30 A M Medical Record Number: [id number] Patient Account Number: [id number] Date of Birth/Sex: Treating RN: 08-20-1975 (45 y.o. [name], [name] Primary Care Provider: [name] Other Clinician: Referring Provider: Treating Provider/Extender: [name] in Treatment: 30 Active  Problems ICD-10 Encounter Code Description Active Date MDM Diagnosis [phone] Non-pressure chronic ulcer of other part of right foot with fat layer exposed 09/03/2020 No Yes L89.893 Pressure ulcer of other site, stage 3 07/15/2020 No Yes L97.522 Non-pressure chronic ulcer of other 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 09/03/2020 No Yes Inactive Problems ICD-10 Code Description Active Date Inactive Date M62.81 Muscle weakness (generalized) 07/15/2020 07/15/2020 I10 Essential (primary) hypertension 07/15/2020 07/15/2020 M86.171 Other acute osteomyelitis, right ankle and foot 09/03/2020 09/03/2020 Resolved Problems Electronic Signature(s) Signed: 02/16/2021 5:25:56 PM By: Linton Ham MD Entered By: Linton Ham on 02/16/2021 12:10:00 -------------------------------------------------------------------------------- Progress Note Details Patient Name: Date of Service: [name], [place] 02/16/2021 10:30 A M Medical  Record Number: [id number] Patient Account Number: [id number] Date of Birth/Sex: Treating RN: October 21, 1975 (45 y.o. [name], [name] Primary Care Provider: [name] Other Clinician: Referring Provider: Treating Provider/Extender: [name] in Treatment: 30 Subjective History of Present Illness (HPI) 07/15/2020 upon evaluation today patient presents for initial inspection here in our clinic concerning issues he has been having with the bottoms of his feet bilaterally. He states these actually occurred as wounds when he was hospitalized for 5 months secondary to Covid. He was apparently with tilting bed where he was in an upright position quite frequently and apparently this occurred in some way shape or form during that time. Fortunately there is no sign of active infection at this time. No fevers, chills, nausea, vomiting, or diarrhea. With that being said he still has substantial wounds on the plantar aspects of his feet Theragen require quite a bit  of work to get these to heal. He has been using Santyl currently though that is been problematic both in receiving the medication as well as actually paid for it as it is become quite expensive. Prior to the experience with Covid the patient really did not have any major medical problems other than hypertension he does have some mild generalized weakness following the Covid experience. 07/22/2020 on evaluation today patient appears to be doing okay in regard to his foot ulcers I feel like the wound beds are showing signs of better improvement that I do believe the Iodoflex is helping in this regard. With that being said he does have a lot of drainage currently and this is somewhat blue/green in nature which is consistent with Pseudomonas. I do think a culture today would be appropriate for [place] to evaluate and see if that is indeed the case I would likely start him on antibiotic orally as well he is not allergic to Cipro knows of no issues he has had in the past 12/21; patient was admitted to the clinic earlier this month with bilateral presumed pressure ulcers on the bottom of his feet apparently related to excessive pressure from a tilt table arrangement in the intensive care unit. Patient relates this to being on ECMO but I am not really sure that is exactly related to that. I must say I have never seen anything like this. He has fairly extensive full-thickness wounds extending from his heel towards his midfoot mostly centered laterally. There is already been some healing distally. He does not appear to have an arterial issue. He has been using gentamicin to the wound surfaces with Iodoflex to help with ongoing debridement 1/6; this is a patient with pressure ulcers on the bottom of his feet related to excessive pressure from a standing position in the intensive care unit. He is complaining of a lot of pain in the right heel. He is not a diabetic. 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 We have been using Iodoflex to help prepare the surfaces of both wounds for an advanced treatment product. He is nonambulatory spending most of his time in a wheelchair I have asked him not to propel the wheelchair with his heels 1/13; in general his wounds look better not much surface area change we have been using Iodoflex as of last week. I did an x-ray of the right heel as the patient was complaining of pain. I had some thoughts about a stress fracture perhaps Achilles tendon problems however what it showed was erosive changes along the inferior aspect of the  calcaneus he now has a MRI booked for 1/20. 1/20; in general his wounds continue to be better. Some improvement in the large narrow areas proximally in his foot. He is still complaining of pain in the right heel and tenderness in certain areas of this wound. His MRI is tonight. I am not just looking for osteomyelitis that was brought up on the x-ray I am wondering about stress fractures, tendon ruptures etc. He has no such findings on the left. Also noteworthy is that the patient had critical illness neuropathy and some of the discomfort may be actual improvement in nerve function I am just not sure. These wounds were initially in the setting of severe critical illness related to COVID-19. He was put in a standing position. He may have also been on pressors at the point contributing to tissue ischemia. By his description at some point these wounds were grossly necrotic extending proximally up into the Achilles part of his heel. I do not know that I have ever really seen pictures of them like this although they may exist in epic We have ordered Tri layer Oasis. I am trying to stimulate some granulation in these areas. This is of course assuming the MRI is negative for infection 1/27; since the patient was last here he saw Dr. Juleen China of infectious disease. He is planned for vancomycin and ceftriaxone. Prior operative culture  grew MSSA. Also ordered baseline lab work. He also ordered arterial studies although the ABIs in our clinic were normal as well as his clinical exam these were normal I do not think he needs to see vascular surgery. His ABIs at the PTA were 1.22 in the right triphasic waveforms with a normal TBI of 1.15 on the left ABI of 1.22 with triphasic waveforms and a normal TBI of 1.08. Finally he saw Dr. Amalia Hailey who will follow him in for 2 months. At this point I do not think he felt that he needed a procedure on the right calcaneal bone. Dr. Juleen China is elected for broad-spectrum antibiotic The patient is still having pain in the right heel. He walks with a walker 2/3; wounds are generally smaller. He is tolerating his IV antibiotics. I believe this is vancomycin and ceftriaxone. We are still waiting for Oasis burn in terms of his out-of-pocket max which he should be meeting soon given the IV antibiotics, MRIs etc. I have asked him to check in on this. We are using silver collagen in the meantime the wounds look better 2/10; tolerating IV vancomycin and Rocephin. We are waiting to apply for Oasis. Although I am not really sure where he is in his out-of-pocket max. 2/17 started the first application of Oasis trilayer. Still on antibiotics. The wounds have generally look better. The area on the left has a little more surface slough requiring debridement 9/16; second application of Oasis trilayer. The wound surface granulation is generally look better. The area on the left with undermining laterally I think is come in a bit. 10/08/2020 upon evaluation today patient is here today for [employer] application #3. Fortunately he seems to be doing extremely well with regard to this and we are seeing a lot of new epithelial growth which is great news. Fortunately there is no signs of active infection at this time. 10/16/2020 upon evaluation today patient appears to be doing well with regard to his foot ulcers. Do  believe the Oasis has been of benefit for him. I do not see any signs of infection right now  which is great news and I think that he has a lot of new epithelial growth which is great to see as well. The patient is very pleased to hear all of this. I do think we can proceed with the Oasis trilayer #4 today. 3/18; not as much improvement in these areas on his heels that I was hoping. I did reapply trilateral Oasis today the tissue looks healthier but not as much fill in as I was hoping. 3/25; better looking today I think this is come in a bit the tissue looks healthier. Triple layer Oasis reapplied #6 4/1; somewhat better looking definitely better looking surface not as much change in surface area as I was hoping. He may be spending more time [place] on days then he needs to although he does have heel offloading boots. Triple layer Oasis reapplied #7 4/7; unfortunately apparently [facility] will not approve any further Oasis which is unfortunate since the patient did respond nicely both in terms of the condition of the wound bed as well as surface area. There is still some drainage coming from the wound but not a lot there does not appear to be any infection 4/15; we have been using Hydrofera Blue. He continues to have nice rims of epithelialization on the right greater than the left. The left the epithelialization is coming from the tip of his heel. There is moderate drainage. In this that concerns me about a total contact cast. There is no evidence of infection 4/29; patient has been using Hydrofera Blue with dressing changes. He has no complaints or issues today. 5/5; using Hydrofera Blue. I actually think that he looks marginally better than the last time I saw this 3 weeks ago. There are rims of epithelialization on the left thumb coming from the medial side on the right. Using Hydrofera Blue 5/12; using Hydrofera Blue. These continue to make improvements in surface area. His drainage  was not listed as severe I therefore went ahead and put a cast on the left foot. Right foot we will continue to dress his previous 5/16; back for first total contact cast change. He did not tolerate this particularly well cast injury on the anterior tibia among other issues. Difficulty sleeping. I talked him about this in some detail and afterwards is elected to continue. I told him I would like to have a cast on for 3 weeks to see if this is going to help at all. I think he agreed 5/19; I think the wound is better. There is no tunneling towards his midfoot. The undermining medially also looks better. He has a rim of new skin distally. I think we are making progress here. The area on the left also continues to look somewhat better to me using Hydrofera Blue. He has a list of complaints about the cast but none of them seem serious 5/26; patient presents for 1 week follow-up. He has been using a total contact cast and tolerating this well. Hydrofera Blue is the main dressing used. He denies signs of infection. 6/2 Hydrofera Blue total contact cast on the left. These were large ulcers that formed in intensive care unit where the patient was recovering from [place]. May have had something to do with being ventilated in an upright positiono Pressors etc. We have been able to get the areas down considerably and a viable surface. There is some epithelialization in both sides. Note made of drainage 6/9; changed to [facility] last time because of drainage. He arrives  with better looking surfaces and dimensions on the left than the right. Paradoxically the right actually probes more towards his midfoot the left is largely close down but both of these look improved. Using a total contact cast on the left 6/16; complex wounds on his bilateral plantar heels which were initially pressure injury from a stay in the ICU with COVID. We have been using silver alginate most recently. His dimensions of come in quite  dramatically however not recently. We have been putting the left foot in a total contact cast 6/23; complex wounds on the bilateral plantar heels. I been putting the left in the cast paradoxically the area on the right is the one that is going towards closure at a faster rate. Quite a bit of drainage on the left. The patient went to see Dr. Amalia Hailey who said he was going to [facility] for skin grafts. I had actually considered sending him for skin grafts however he would be mandatorily off his feet for a period of weeks to months. I am thinking that the area on the right is going to close on its own the area on the left has been more stubborn even though we have him in a total contact cast 6/30; took him out of a total contact cast last week is the right heel seem to be making better progress than the left where I was placing the cast. We are using silver alginate. Both wounds are smaller right greater than left 7/12; both wounds look as though they are making some progress. We are using silver alginate. Heel offloading boots Objective Constitutional Sitting or standing Blood Pressure is within target range for patient.. Pulse regular and within target range for patient.Marland Kitchen Respirations regular, non-labored and within target range.. Temperature is normal and within the target range for the patient.Marland Kitchen Appears in no distress. Vitals Time Taken: 11:20 AM, Height: 69 in, Weight: 280 lbs, BMI: 41.3, Temperature: 98.4 F, Pulse: 73 bpm, Respiratory Rate: 16 breaths/min, Blood Pressure: 126/86 mmHg. General Notes: Wound exam; bilateral plantar heels. I think they continue to contract. The right still has the area pointing towards the midfoot neither side require debridement at this point. No evidence of infection no exposed bone Integumentary (Hair, Skin) Wound #1 status is Open. Original cause of wound was Pressure Injury. The date acquired was: 10/07/2019. The wound has been in treatment 30 weeks. The wound is  located on the Right Calcaneus. The wound measures 3.7cm length x 0.7cm width x 0.1cm depth; 2.034cm^2 area and 0.203cm^3 volume. There is Fat Layer (Subcutaneous Tissue) exposed. There is no tunneling or undermining noted. There is a medium amount of serosanguineous drainage noted. The wound margin is distinct with the outline attached to the wound base. There is large (67-100%) red, pink, pale granulation within the wound bed. There is a small (1-33%) amount of necrotic tissue within the wound bed including Adherent Slough. Wound #2 status is Open. Original cause of wound was Pressure Injury. The date acquired was: 10/07/2019. The wound has been in treatment 30 weeks. The wound is located on the Left Calcaneus. The wound measures 4.3cm length x 1.6cm width x 0.5cm depth; 5.404cm^2 area and 2.702cm^3 volume. There is Fat Layer (Subcutaneous Tissue) exposed. There is no tunneling noted, however, there is undermining starting at 6:00 and ending at 12:00 with a maximum distance of 0.5cm. There is a medium amount of serosanguineous drainage noted. The wound margin is thickened. There is large (67-100%) pink granulation within the wound bed. There  is a small (1-33%) amount of necrotic tissue within the wound bed including Adherent Slough. Assessment Active Problems ICD-10 Non-pressure chronic ulcer of other part of right foot with fat layer exposed Pressure ulcer of other site, stage 3 Non-pressure chronic ulcer of other 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 Plan Follow-up Appointments: Return Appointment in 2 weeks. - on Thursday Dr. Dellia Nims!!!! Bathing/ Shower/ Hygiene: May shower with protection but do not get wound dressing(s) wet. Edema Control - Lymphedema / SCD / Other: Elevate legs to the level of the heart or above for 30 minutes daily and/or when sitting, a frequency of: Avoid standing for long periods of time. Moisturize legs daily. - right leg and foot every night. Off-Loading: Wedge  shoe to: - Glophed Shoe to both feet. Other: - keep pressure off of the bottom of your feet Additional Orders / Instructions: Follow Nutritious Diet WOUND #1: - Calcaneus Wound Laterality: Right Cleanser: Normal Saline (Generic) Every Other Day/30 Days Discharge Instructions: Cleanse the wound with Normal Saline prior to applying a clean dressing using gauze sponges, not tissue or cotton balls. Cleanser: Wound Cleanser Every Other Day/30 Days Discharge Instructions: Cleanse the wound with wound cleanser prior to applying a clean dressing using gauze sponges, not tissue or cotton balls. Prim Dressing: KerraCel Ag Gelling Fiber Dressing, 2x2 in (silver alginate) (Generic) Every Other Day/30 Days ary Discharge Instructions: ****Ensure to apply into divot area of wound.**** Apply silver alginate to wound bed as instructed Secondary Dressing: Zetuvit Plus 4x8 in (Generic) Every Other Day/30 Days Discharge Instructions: Apply over primary dressing as directed. Secured With: Elastic Bandage 4 inch (ACE bandage) Every Other Day/30 Days Discharge Instructions: Secure with ACE bandage as directed. Secured With: [employer], 4.5x3.1 (in/yd) (Generic) Every Other Day/30 Days Discharge Instructions: Secure with Kerlix as directed. WOUND #2: - Calcaneus Wound Laterality: Left Cleanser: Normal Saline (Generic) Every Other Day/30 Days Discharge Instructions: Cleanse the wound with Normal Saline prior to applying a clean dressing using gauze sponges, not tissue or cotton balls. Cleanser: Wound Cleanser Every Other Day/30 Days Discharge Instructions: Cleanse the wound with wound cleanser prior to applying a clean dressing using gauze sponges, not tissue or cotton balls. Prim Dressing: KerraCel Ag Gelling Fiber Dressing, 2x2 in (silver alginate) (Generic) Every Other Day/30 Days ary Discharge Instructions: ****Ensure to apply into divot area of wound.**** Apply silver alginate to wound bed as  instructed Secondary Dressing: Zetuvit Plus 4x8 in (Generic) Every Other Day/30 Days Discharge Instructions: Apply over primary dressing as directed. Secured With: Elastic Bandage 4 inch (ACE bandage) Every Other Day/30 Days Discharge Instructions: Secure with ACE bandage as directed. Secured With: [employer], 4.5x3.1 (in/yd) (Generic) Every Other Day/30 Days Discharge Instructions: Secure with Kerlix as directed. 1. I continued with silver alginate on both sides back to visit to that with an Ace bandage on both sides 2. We are using heel offloading boots Electronic Signature(s) Signed: 02/16/2021 5:25:56 PM By: Linton Ham MD Entered By: Linton Ham on 02/16/2021 12:17:25 -------------------------------------------------------------------------------- SuperBill Details Patient Name: Date of Service: [name], Many. 02/16/2021 Medical Record Number: [id number] Patient Account Number: [id number] Date of Birth/Sex: Treating RN: 20-Jan-1976 (45 y.o. [name] Primary Care Provider: [name] Other Clinician: Referring Provider: Treating Provider/Extender: [name] in Treatment: 30 Diagnosis Coding ICD-10 Codes Code Description [phone] Non-pressure chronic ulcer of other part of right foot with fat layer exposed L89.893 Pressure ulcer of other site, stage 3 L97.522 Non-pressure chronic  ulcer of other 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 Physician Procedures : CPT4 Code Description Modifier [id number] - WC PHYS LEVEL 3 - EST PT ICD-10 Diagnosis Description L97.512 Non-pressure chronic ulcer of other part of right foot with fat layer exposed L97.522 Non-pressure chronic ulcer of other part of left foot  with fat layer exposed Quantity: 1 Electronic Signature(s) Signed: 02/16/2021 5:25:56 PM By: Linton Ham MD Entered By: Linton Ham on 02/16/2021 12:17:46

## 2021-02-22 DIAGNOSIS — L110 Acquired keratosis follicularis: Secondary | ICD-10-CM | POA: Diagnosis not present

## 2021-02-22 DIAGNOSIS — L503 Dermatographic urticaria: Secondary | ICD-10-CM | POA: Diagnosis not present

## 2021-02-22 DIAGNOSIS — L298 Other pruritus: Secondary | ICD-10-CM | POA: Diagnosis not present

## 2021-02-22 NOTE — Progress Notes (Signed)
DIONTAE, ROUTE ([id number]) Visit Report for 02/16/2021 Arrival Information Details Patient Name: Date of Service: [name], [place] 02/16/2021 10:30 A M Medical Record Number: [id number] Patient Account Number: [id number] Date of Birth/Sex: Treating RN: 02-10-1976 (45 y.o. [name] Primary Care Jye Fariss: [name] Other Clinician: Referring Ellsworth Waldschmidt: Treating Adlynn Lowenstein/Extender: [name] in Treatment: 17 Visit Information History Since Last Visit Added or deleted any medications: No Patient Arrived: Wheel Chair Any new allergies or adverse reactions: No Arrival Time: 11:20 Had a fall or experienced change in No Accompanied By: son activities of daily living that may affect Transfer Assistance: None risk of falls: Patient Identification Verified: Yes Signs or symptoms of abuse/neglect since last visito No Secondary Verification Process Completed: Yes Hospitalized since last visit: No Patient Requires Transmission-Based Precautions: No Implantable device outside of the clinic excluding No Patient Has Alerts: No cellular tissue based products placed in the center since last visit: Has Dressing in Place as Prescribed: Yes Has Footwear/Offloading in Place as Prescribed: Yes Left: Wedge Shoe Right: Wedge Shoe Pain Present Now: No Electronic Signature(s) Signed: 02/16/2021 6:16:29 PM By: [name] Entered By: [name] on 02/16/2021 11:32:02 -------------------------------------------------------------------------------- Lower Extremity Assessment Details Patient Name: Date of Service: [name], [place] E. 02/16/2021 10:30 A M Medical Record Number: [id number] Patient Account Number: [id number] Date of Birth/Sex: Treating RN: 1975-08-13 (45 y.o. [name] Primary Care Jacquette Canales: [name] Other Clinician: Referring Jaiyana Canale: Treating Jesilyn Easom/Extender: [name] in Treatment: 30 Edema Assessment Assessed:  [name]: Yes] [name]: Yes] Edema: [Left: No] [Right: No] Calf Left: Right: Point of Measurement: 29 cm From Medial Instep 42 cm 42 cm Ankle Left: Right: Point of Measurement: 9 cm From Medial Instep 27.5 cm 27 cm Vascular Assessment Pulses: Dorsalis Pedis Palpable: [Left:Yes] [Right:Yes] Electronic Signature(s) Signed: 02/16/2021 6:16:29 PM By: [name] Entered By: [name] on 02/16/2021 11:33:26 -------------------------------------------------------------------------------- Multi Wound Chart Details Patient Name: Date of Service: [name], [place] 02/16/2021 10:30 A M Medical Record Number: [id number] Patient Account Number: [id number] Date of Birth/Sex: Treating RN: January 25, 1976 (45 y.o. [name], [name] Primary Care Raaga Maeder: [name] Other Clinician: Referring Kshawn Canal: Treating Jeanne Diefendorf/Extender: [name] in Treatment: 30 Vital Signs Height(in): 72 Pulse(bpm): 65 Weight(lbs): 34 Blood Pressure(mmHg): 126/86 Body Mass Index(BMI): 41 Temperature(F): 98.4 Respiratory Rate(breaths/min): 16 Photos: [1:No Photos Right Calcaneus] [2:No Photos Left Calcaneus] [N/A:N/A N/A] Wound Location: [1:Pressure Injury] [2:Pressure Injury] [N/A:N/A] Wounding Event: [1:Pressure Ulcer] [2:Pressure Ulcer] [N/A:N/A] Primary Etiology: [1:Asthma, Angina, Hypertension] [2:Asthma, Angina, Hypertension] [N/A:N/A] Comorbid History: [1:10/07/2019] [2:10/07/2019] [N/A:N/A] Date Acquired: [1:30] [2:30] [N/A:N/A] Weeks of Treatment: [1:Open] [2:Open] [N/A:N/A] Wound Status: [1:3.7x0.7x0.1] [2:4.3x1.6x0.5] [N/A:N/A] Measurements L x W x D (cm) [1:2.034] [2:5.404] [N/A:N/A] A (cm) : rea [1:0.203] [2:2.702] [N/A:N/A] Volume (cm) : [1:93.60%] [2:80.10%] [N/A:N/A] % Reduction in A rea: [1:99.40%] [2:90.00%] [N/A:N/A] % Reduction in Volume: [2:6] Starting Position 1 (o'clock): [2:12] Ending Position 1 (o'clock): [2:0.5] Maximum Distance 1 (cm): [1:No] [2:Yes]  [N/A:N/A] Undermining: [1:Category/Stage III] [2:Category/Stage III] [N/A:N/A] Classification: [1:Medium] [2:Medium] [N/A:N/A] Exudate A mount: [1:Serosanguineous] [2:Serosanguineous] [N/A:N/A] Exudate Type: [1:red, brown] [2:red, brown] [N/A:N/A] Exudate Color: [1:Distinct, outline attached] [2:Thickened] [N/A:N/A] Wound Margin: [1:Large (67-100%)] [2:Large (67-100%)] [N/A:N/A] Granulation A mount: [1:Red, Pink, Pale] [2:Pink] [N/A:N/A] Granulation Quality: [1:Small (1-33%)] [2:Small (1-33%)] [N/A:N/A] Necrotic A mount: [1:Fat Layer (Subcutaneous Tissue): Yes Fat Layer (Subcutaneous Tissue): Yes N/A] Exposed Structures: [1:Fascia: No Tendon: No Muscle: No Joint: No Bone: No Medium (34-66%)] [2:Fascia: No Tendon: No Muscle: No  Joint: No Bone: No Small (1-33%)] [N/A:N/A] Treatment Notes Electronic Signature(s) Signed: 02/16/2021 5:25:56 PM By: Linton Ham MD Signed: 02/17/2021 6:12:45 PM By: [name] RN Entered By: Linton Ham on 02/16/2021 12:11:05 -------------------------------------------------------------------------------- Multi-Disciplinary Care Plan Details Patient Name: Date of Service: [name], [place] 02/16/2021 10:30 A M Medical Record Number: [id number] Patient Account Number: [id number] Date of Birth/Sex: Treating RN: 1976-03-05 (45 y.o. [name], [name] Primary Care Westlynn Fifer: [name] Other Clinician: Referring Laycee Fitzsimmons: Treating Mackenzee Becvar/Extender: [name] in Treatment: 92 Multidisciplinary Care Plan reviewed with physician Active Inactive Abuse / Safety / Falls / Self Care Management Nursing Diagnoses: Potential for falls Goals: Patient/caregiver will verbalize/demonstrate measures taken to prevent injury and/or falls Date Initiated: 07/15/2020 Target Resolution Date: 03/05/2021 Goal Status: Active Interventions: Assess fall risk on admission and as needed Assess impairment of mobility on admission and as needed  per policy Notes: Electronic Signature(s) Signed: 02/17/2021 6:12:45 PM By: [name] RN Entered By: [name] on 02/16/2021 11:44:09 -------------------------------------------------------------------------------- Pain Assessment Details Patient Name: Date of Service: [name], [place] E. 02/16/2021 10:30 A M Medical Record Number: [id number] Patient Account Number: [id number] Date of Birth/Sex: Treating RN: 1975/11/29 (45 y.o. [name] Primary Care Cashay Manganelli: [name] Other Clinician: Referring Aspen Deterding: Treating Mee Macdonnell/Extender: [name] in Treatment: 30 Active Problems Location of Pain Severity and Description of Pain Patient Has Paino No Site Locations Rate the pain. Rate the pain. Current Pain Level: 0 Pain Management and Medication Current Pain Management: Medication: No Cold Application: No Rest: No Massage: No Activity: No T.E.N.S.: No Heat Application: No Leg drop or elevation: No Is the Current Pain Management Adequate: Adequate How does your wound impact your activities of daily livingo Sleep: No Bathing: No Appetite: No Relationship With Others: No Bladder Continence: No Emotions: No Bowel Continence: No Work: No Toileting: No Drive: No Dressing: No Hobbies: No Electronic Signature(s) Signed: 02/16/2021 6:16:29 PM By: [name] Entered By: [name] on 02/16/2021 11:33:00 -------------------------------------------------------------------------------- Patient/Caregiver Education Details Patient Name: Date of Service: [name], [place] 7/12/2022andnbsp10:30 [place] Record Number: [id number] Patient Account Number: [id number] Date of Birth/Gender: Treating RN: Sep 01, 1975 (45 y.o. [name] Primary Care Physician: [name] Other Clinician: Referring Physician: Treating Physician/Extender: [name] in Treatment: 61 Education  Assessment Education Provided To: Patient Education Topics Provided Wound/Skin Impairment: Methods: Explain/Verbal Responses: State content correctly Electronic Signature(s) Signed: 02/17/2021 6:12:45 PM By: [name] RN Entered By: [name] on 02/16/2021 11:44:32 -------------------------------------------------------------------------------- Wound Assessment Details Patient Name: Date of Service: [name], [place] 02/16/2021 10:30 A M Medical Record Number: [id number] Patient Account Number: [id number] Date of Birth/Sex: Treating RN: 1975-12-12 (45 y.o. [name], [name] Primary Care Oluwatimilehin Balfour: [name] Other Clinician: Referring Maleia Weems: Treating Etherine Mackowiak/Extender: [name] in Treatment: 30 Wound Status Wound Number: 1 Primary Etiology: Pressure Ulcer Wound Location: Right Calcaneus Wound Status: Open Wounding Event: Pressure Injury Comorbid History: Asthma, Angina, Hypertension Date Acquired: 10/07/2019 Weeks Of Treatment: 30 Clustered Wound: No Photos Wound Measurements Length: (cm) 3.7 Width: (cm) 0.7 Depth: (cm) 0.1 Area: (cm) 2.034 Volume: (cm) 0.203 % Reduction in Area: 93.6% % Reduction in Volume: 99.4% Epithelialization: Medium (34-66%) Tunneling: No Undermining: No Wound Description Classification: Category/Stage III Wound Margin: Distinct, outline attached Exudate Amount: Medium Exudate Type: Serosanguineous Exudate Color: red, brown Foul Odor After Cleansing: No Slough/Fibrino No Wound Bed Granulation Amount: Large (67-100%) Exposed Structure Granulation Quality: Red, Pink, Pale  Fascia Exposed: No Necrotic Amount: Small (1-33%) Fat Layer (Subcutaneous Tissue) Exposed: Yes Necrotic Quality: Adherent Slough Tendon Exposed: No Muscle Exposed: No Joint Exposed: No Bone Exposed: No Electronic Signature(s) Signed: 02/16/2021 6:16:29 PM By: [name] Signed: 02/22/2021 3:49:49 PM By: Sandre Kitty Entered By: Sandre Kitty on 02/16/2021 16:58:12 -------------------------------------------------------------------------------- Wound Assessment Details Patient Name: Date of Service: [name], [place] 02/16/2021 10:30 A M Medical Record Number: [id number] Patient Account Number: [id number] Date of Birth/Sex: Treating RN: [birth year] (45 y.o. [name], [name] Primary Care Thanos Cousineau: [name] Other Clinician: Referring Francella Barnett: Treating Bradan Congrove/Extender: [name] in Treatment: 30 Wound Status Wound Number: 2 Primary Etiology: Pressure Ulcer Wound Location: Left Calcaneus Wound Status: Open Wounding Event: Pressure Injury Comorbid History: Asthma, Angina, Hypertension Date Acquired: 10/07/2019 Weeks Of Treatment: 30 Clustered Wound: No Photos Wound Measurements Length: (cm) 4.3 Width: (cm) 1.6 Depth: (cm) 0.5 Area: (cm) 5.404 Volume: (cm) 2.702 % Reduction in Area: 80.1% % Reduction in Volume: 90% Epithelialization: Small (1-33%) Tunneling: No Undermining: Yes Starting Position (o'clock): 6 Ending Position (o'clock): 12 Maximum Distance: (cm) 0.5 Wound Description Classification: Category/Stage III Wound Margin: Thickened Exudate Amount: Medium Exudate Type: Serosanguineous Exudate Color: red, brown Foul Odor After Cleansing: No Slough/Fibrino Yes Wound Bed Granulation Amount: Large (67-100%) Exposed Structure Granulation Quality: Pink Fascia Exposed: No Necrotic Amount: Small (1-33%) Fat Layer (Subcutaneous Tissue) Exposed: Yes Necrotic Quality: Adherent Slough Tendon Exposed: No Muscle Exposed: No Joint Exposed: No Bone Exposed: No Electronic Signature(s) Signed: 02/16/2021 6:16:29 PM By: [name] Signed: 02/22/2021 3:49:49 PM By: Sandre Kitty Entered By: Sandre Kitty on 02/16/2021 16:58:54 -------------------------------------------------------------------------------- Vitals Details Patient  Name: Date of Service: [name], [place] E. 02/16/2021 10:30 A M Medical Record Number: [id number] Patient Account Number: [id number] Date of Birth/Sex: Treating RN: 1976/07/19 (45 y.o. [name] Primary Care Codey Burling: [name] Other Clinician: Referring Tanay Misuraca: Treating Linsi Humann/Extender: [name] in Treatment: 30 Vital Signs Time Taken: 11:20 Temperature (F): 98.4 Height (in): 69 Pulse (bpm): 73 Weight (lbs): 280 Respiratory Rate (breaths/min): 16 Body Mass Index (BMI): 41.3 Blood Pressure (mmHg): 126/86 Reference Range: 80 - 120 mg / dl Electronic Signature(s) Signed: 02/16/2021 6:16:29 PM By: [name] Entered By: [name] on 02/16/2021 11:32:52

## 2021-03-02 ENCOUNTER — Other Ambulatory Visit: Payer: Self-pay

## 2021-03-02 ENCOUNTER — Encounter (HOSPITAL_BASED_OUTPATIENT_CLINIC_OR_DEPARTMENT_OTHER): Payer: BC Managed Care – PPO | Admitting: Internal Medicine

## 2021-03-02 DIAGNOSIS — L89620 Pressure ulcer of left heel, unstageable: Secondary | ICD-10-CM | POA: Diagnosis not present

## 2021-03-02 DIAGNOSIS — L89610 Pressure ulcer of right heel, unstageable: Secondary | ICD-10-CM | POA: Diagnosis not present

## 2021-03-02 DIAGNOSIS — L97522 Non-pressure chronic ulcer of other 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: Secondary | ICD-10-CM | POA: Diagnosis not present

## 2021-03-02 DIAGNOSIS — L89893 Pressure ulcer of other site, stage 3: Secondary | ICD-10-CM | POA: Diagnosis not present

## 2021-03-02 DIAGNOSIS — L97512 Non-pressure chronic ulcer of other part of right foot with fat layer exposed: Secondary | ICD-10-CM | POA: Diagnosis not present

## 2021-03-02 NOTE — Progress Notes (Signed)
[name] ([id number]) Visit Report for 03/02/2021 HPI Details Patient Name: Date of Service: [name], [name] 03/02/2021 10:30 A M Medical Record Number: [id number] Patient Account Number: [id number] Date of Birth/Sex: Treating RN: Oct 02, 1975 (45 y.o. [name] Other Clinician: Referring [name]: Treating [name]/Extender: [name] in Treatment: 32 History of Present Illness HPI Description: 07/15/2020 upon evaluation today patient presents for initial inspection here in our clinic concerning issues he has been having with the bottoms of his feet bilaterally. He states these actually occurred as wounds when he was hospitalized for 5 months secondary to Covid. He was apparently with tilting bed where he was in an upright position quite frequently and apparently this occurred in some way shape or form during that time. Fortunately there is no sign of active infection at this time. No fevers, chills, nausea, vomiting, or diarrhea. With that being said he still has substantial wounds on the plantar aspects of his feet Theragen require quite a bit of work to get these to heal. He has been using Santyl currently though that is been problematic both in receiving the medication as well as actually paid for it as it is become quite expensive. Prior to the experience with Covid the patient really did not have any major medical problems other than hypertension he does have some mild generalized weakness following the Covid experience. 07/22/2020 on evaluation today patient appears to be doing okay in regard to his foot ulcers I feel like the wound beds are showing signs of better improvement that I do believe the Iodoflex is helping in this regard. With that being said he does have a lot of drainage currently and this is somewhat blue/green in nature which is consistent with Pseudomonas. I do think a culture today would be  appropriate for [place] to evaluate and see if that is indeed the case I would likely start him on antibiotic orally as well he is not allergic to Cipro knows of no issues he has had in the past 12/21; patient was admitted to the clinic earlier this month with bilateral presumed pressure ulcers on the bottom of his feet apparently related to excessive pressure from a tilt table arrangement in the intensive care unit. Patient relates this to being on ECMO but I am not really sure that is exactly related to that. I must say I have never seen anything like this. He has fairly extensive full-thickness wounds extending from his heel towards his midfoot mostly centered laterally. There is already been some healing distally. He does not appear to have an arterial issue. He has been using gentamicin to the wound surfaces with Iodoflex to help with ongoing debridement 1/6; this is a patient with pressure ulcers on the bottom of his feet related to excessive pressure from a standing position in the intensive care unit. He is complaining of a lot of pain in the right heel. He is not a diabetic. 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 We have been using Iodoflex to help prepare the surfaces of both wounds for an advanced treatment product. He is nonambulatory spending most of his time in a wheelchair I have asked him not to propel the wheelchair with his heels 1/13; in general his wounds look better not much surface area change we have been using Iodoflex as of last week. I did an x-ray of the right heel as the patient was complaining of pain. I  had some thoughts about a stress fracture perhaps Achilles tendon problems however what it showed was erosive changes along the inferior aspect of the calcaneus he now has a MRI booked for 1/20. 1/20; in general his wounds continue to be better. Some improvement in the large narrow areas proximally in his foot. He is still complaining of pain in the  right heel and tenderness in certain areas of this wound. His MRI is tonight. I am not just looking for osteomyelitis that was brought up on the x-ray I am wondering about stress fractures, tendon ruptures etc. He has no such findings on the left. Also noteworthy is that the patient had critical illness neuropathy and some of the discomfort may be actual improvement in nerve function I am just not sure. These wounds were initially in the setting of severe critical illness related to COVID-19. He was put in a standing position. He may have also been on pressors at the point contributing to tissue ischemia. By his description at some point these wounds were grossly necrotic extending proximally up into the Achilles part of his heel. I do not know that I have ever really seen pictures of them like this although they may exist in epic We have ordered Tri layer Oasis. I am trying to stimulate some granulation in these areas. This is of course assuming the MRI is negative for infection 1/27; since the patient was last here he saw Dr. Juleen [name] of infectious disease. He is planned for vancomycin and ceftriaxone. Prior operative culture grew MSSA. Also ordered baseline lab work. He also ordered arterial studies although the ABIs in our clinic were normal as well as his clinical exam these were normal I do not think he needs to see vascular surgery. His ABIs at the PTA were 1.22 in the right triphasic waveforms with a normal TBI of 1.15 on the left ABI of 1.22 with triphasic waveforms and a normal TBI of 1.08. Finally he saw Dr. Amalia [name] who will follow him in for 2 months. At this point I do not think he felt that he needed a procedure on the right calcaneal bone. Dr. Juleen [name] is elected for broad-spectrum antibiotic The patient is still having pain in the right heel. He walks with a walker 2/3; wounds are generally smaller. He is tolerating his IV antibiotics. I believe this is vancomycin and ceftriaxone. We are  still waiting for Oasis burn in terms of his out-of-pocket max which he should be meeting soon given the IV antibiotics, MRIs etc. I have asked him to check in on this. We are using silver collagen in the meantime the wounds look better 2/10; tolerating IV vancomycin and Rocephin. We are waiting to apply for Oasis. Although I am not really sure where he is in his out-of-pocket max. 2/17 started the first application of Oasis trilayer. Still on antibiotics. The wounds have generally look better. The area on the left has a little more surface slough requiring debridement 2/40; second application of Oasis trilayer. The wound surface granulation is generally look better. The area on the left with undermining laterally I think is come in a bit. 10/08/2020 upon evaluation today patient is here today for [employer] application #3. Fortunately he seems to be doing extremely well with regard to this and we are seeing a lot of new epithelial growth which is great news. Fortunately there is no signs of active infection at this time. 10/16/2020 upon evaluation today patient appears to be doing well with regard  to his foot ulcers. Do believe the Oasis has been of benefit for him. I do not see any signs of infection right now which is great news and I think that he has a lot of new epithelial growth which is great to see as well. The patient is very pleased to hear all of this. I do think we can proceed with the Oasis trilayer #4 today. 3/18; not as much improvement in these areas on his heels that I was hoping. I did reapply trilateral Oasis today the tissue looks healthier but not as much fill in as I was hoping. 3/25; better looking today I think this is come in a bit the tissue looks healthier. Triple layer Oasis reapplied #6 4/1; somewhat better looking definitely better looking surface not as much change in surface area as I was hoping. He may be spending more time [place] on days then he needs to although  he does have heel offloading boots. Triple layer Oasis reapplied #7 4/7; unfortunately apparently [facility] will not approve any further Oasis which is unfortunate since the patient did respond nicely both in terms of the condition of the wound bed as well as surface area. There is still some drainage coming from the wound but not a lot there does not appear to be any infection 4/15; we have been using Hydrofera Blue. He continues to have nice rims of epithelialization on the right greater than the left. The left the epithelialization is coming from the tip of his heel. There is moderate drainage. In this that concerns me about a total contact cast. There is no evidence of infection 4/29; patient has been using Hydrofera Blue with dressing changes. He has no complaints or issues today. 5/5; using Hydrofera Blue. I actually think that he looks marginally better than the last time I saw this 3 weeks ago. There are rims of epithelialization on the left thumb coming from the medial side on the right. Using Hydrofera Blue 5/12; using Hydrofera Blue. These continue to make improvements in surface area. His drainage was not listed as severe I therefore went ahead and put a cast on the left foot. Right foot we will continue to dress his previous 5/16; back for first total contact cast change. He did not tolerate this particularly well cast injury on the anterior tibia among other issues. Difficulty sleeping. I talked him about this in some detail and afterwards is elected to continue. I told him I would like to have a cast on for 3 weeks to see if this is going to help at all. I think he agreed 5/19; I think the wound is better. There is no tunneling towards his midfoot. The undermining medially also looks better. He has a rim of new skin distally. I think we are making progress here. The area on the left also continues to look somewhat better to me using Hydrofera Blue. He has a list of  complaints about the cast but none of them seem serious 5/26; patient presents for 1 week follow-up. He has been using a total contact cast and tolerating this well. Hydrofera Blue is the main dressing used. He denies signs of infection. 6/2 Hydrofera Blue total contact cast on the left. These were large ulcers that formed in intensive care unit where the patient was recovering from [place]. May have had something to do with being ventilated in an upright positiono Pressors etc. We have been able to get the areas down considerably and a  viable surface. There is some epithelialization in both sides. Note made of drainage 6/9; changed to [facility] last time because of drainage. He arrives with better looking surfaces and dimensions on the left than the right. Paradoxically the right actually probes more towards his midfoot the left is largely close down but both of these look improved. Using a total contact cast on the left 6/16; complex wounds on his bilateral plantar heels which were initially pressure injury from a stay in the ICU with COVID. We have been using silver alginate most recently. His dimensions of come in quite dramatically however not recently. We have been putting the left foot in a total contact cast 6/23; complex wounds on the bilateral plantar heels. I been putting the left in the cast paradoxically the area on the right is the one that is going towards closure at a faster rate. Quite a bit of drainage on the left. The patient went to see Dr. Amalia [name] who said he was going to [facility] for skin grafts. I had actually considered sending him for skin grafts however he would be mandatorily off his feet for a period of weeks to months. I am thinking that the area on the right is going to close on its own the area on the left has been more stubborn even though we have him in a total contact cast 6/30; took him out of a total contact cast last week is the right heel seem to be making better  progress than the left where I was placing the cast. We are using silver alginate. Both wounds are smaller right greater than left 7/12; both wounds look as though they are making some progress. We are using silver alginate. Heel offloading boots 7/26; very gradual progress especially on the right. Using silver alginate. He is wearing heel offloading boots Electronic Signature(s) Signed: 03/02/2021 5:43:33 PM By: Linton Ham MD Entered By: Linton Ham on 03/02/2021 12:19:11 -------------------------------------------------------------------------------- Physical Exam Details Patient Name: Date of Service: [name], [place] 03/02/2021 10:30 A M Medical Record Number: [id number] Patient Account Number: [id number] Date of Birth/Sex: Treating RN: Jan 28, 1976 (45 y.o. [name] Other Clinician: Referring [name]: Treating [name]/Extender: [name] in Treatment: 33 Constitutional Patient is hypertensive.. Pulse regular and within target range for patient.Marland Kitchen Respirations regular, non-labored and within target range.. Temperature is normal and within the target range for the patient.Marland Kitchen Appears in no distress. Notes Wound exam; bilateral plantar heels. The especially on the right they continue to contract. Wound surface looks reasonable. Some epithelialization. No evidence of infection. No exposed bone or deep tissue Electronic Signature(s) Signed: 03/02/2021 5:43:33 PM By: Linton Ham MD Entered By: Linton Ham on 03/02/2021 12:19:53 -------------------------------------------------------------------------------- Physician Orders Details Patient Name: Date of Service: [name], [place] 03/02/2021 10:30 A M Medical Record Number: [id number] Patient Account Number: [id number] Date of Birth/Sex: Treating RN: 1975/09/18 (45 y.o. [name] Primary Care [name]: [name] Other Clinician: Referring  [name]: Treating [name]/Extender: [name] in Treatment: 82 Verbal / Phone Orders: No Diagnosis Coding Follow-up Appointments Return appointment in 3 weeks. - Dr. Dellia Nims Bathing/ Shower/ Hygiene May shower with protection but do not get wound dressing(s) wet. Edema Control - Lymphedema / SCD / Other Bilateral Lower Extremities Elevate legs to the level of the heart or above for 30 minutes daily and/or when sitting, a frequency of: Avoid standing for long periods of time. Moisturize legs daily. -  right leg and foot every night. Off-Loading Wedge shoe to: - Glophed Shoe to both feet. Other: - keep pressure off of the bottom of your feet Additional Orders / Instructions Follow Nutritious Diet Wound Treatment Wound #1 - Calcaneus Wound Laterality: Right Cleanser: Normal Saline (Generic) Every Other Day/30 Days Discharge Instructions: Cleanse the wound with Normal Saline prior to applying a clean dressing using gauze sponges, not tissue or cotton balls. Cleanser: Wound Cleanser Every Other Day/30 Days Discharge Instructions: Cleanse the wound with wound cleanser prior to applying a clean dressing using gauze sponges, not tissue or cotton balls. Prim Dressing: KerraCel Ag Gelling Fiber Dressing, 2x2 in (silver alginate) (Generic) Every Other Day/30 Days ary Discharge Instructions: ****Ensure to apply into divot area of wound.**** Apply silver alginate to wound bed as instructed Secondary Dressing: Zetuvit Plus 4x8 in (Generic) Every Other Day/30 Days Discharge Instructions: Apply over primary dressing as directed. Secured With: Elastic Bandage 4 inch (ACE bandage) Every Other Day/30 Days Discharge Instructions: Secure with ACE bandage as directed. Secured With: [employer], 4.5x3.1 (in/yd) (Generic) Every Other Day/30 Days Discharge Instructions: Secure with Kerlix as directed. Wound #2 - Calcaneus Wound Laterality: Left Cleanser: Normal Saline  (Generic) Every Other Day/30 Days Discharge Instructions: Cleanse the wound with Normal Saline prior to applying a clean dressing using gauze sponges, not tissue or cotton balls. Cleanser: Wound Cleanser Every Other Day/30 Days Discharge Instructions: Cleanse the wound with wound cleanser prior to applying a clean dressing using gauze sponges, not tissue or cotton balls. Prim Dressing: KerraCel Ag Gelling Fiber Dressing, 2x2 in (silver alginate) (Generic) Every Other Day/30 Days ary Discharge Instructions: ****Ensure to apply into divot area of wound.**** Apply silver alginate to wound bed as instructed Secondary Dressing: Zetuvit Plus 4x8 in (Generic) Every Other Day/30 Days Discharge Instructions: Apply over primary dressing as directed. Secured With: Elastic Bandage 4 inch (ACE bandage) Every Other Day/30 Days Discharge Instructions: Secure with ACE bandage as directed. Secured With: [employer], 4.5x3.1 (in/yd) (Generic) Every Other Day/30 Days Discharge Instructions: Secure with Kerlix as directed. Electronic Signature(s) Signed: 03/02/2021 5:08:38 PM By: Baruch Gouty RN, BSN Signed: 03/02/2021 5:43:33 PM By: Linton Ham MD Entered By: Baruch Gouty on 03/02/2021 11:21:54 -------------------------------------------------------------------------------- Problem List Details Patient Name: Date of Service: [name], [place] 03/02/2021 10:30 A M Medical Record Number: [id number] Patient Account Number: [id number] Date of Birth/Sex: Treating RN: 1975-11-12 (45 y.o. [name], [name] Primary Care [name]: [name] Other Clinician: Referring [name]: Treating [name]/Extender: [name] in Treatment: 32 Active Problems ICD-10 Encounter Code Description Active Date MDM Diagnosis [phone] Non-pressure chronic ulcer of other part of right foot with fat layer exposed 09/03/2020 No Yes L89.893 Pressure ulcer of other site, stage 3 07/15/2020 No  Yes L97.522 Non-pressure chronic ulcer of other 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 09/03/2020 No Yes Inactive Problems ICD-10 Code Description Active Date Inactive Date M62.81 Muscle weakness (generalized) 07/15/2020 07/15/2020 I10 Essential (primary) hypertension 07/15/2020 07/15/2020 M86.171 Other acute osteomyelitis, right ankle and foot 09/03/2020 09/03/2020 Resolved Problems Electronic Signature(s) Signed: 03/02/2021 5:43:33 PM By: Linton Ham MD Entered By: Linton Ham on 03/02/2021 12:15:13 -------------------------------------------------------------------------------- Progress Note Details Patient Name: Date of Service: [name], [place] 03/02/2021 10:30 A M Medical Record Number: [id number] Patient Account Number: [id number] Date of Birth/Sex: Treating RN: Dec 08, 1975 (45 y.o. [name]: Other Clinician: Cristie [name] Referring [name]: Treating [name]/Extender: [name] in Treatment: 32 Subjective  History of Present Illness (HPI) 07/15/2020 upon evaluation today patient presents for initial inspection here in our clinic concerning issues he has been having with the bottoms of his feet bilaterally. He states these actually occurred as wounds when he was hospitalized for 5 months secondary to Covid. He was apparently with tilting bed where he was in an upright position quite frequently and apparently this occurred in some way shape or form during that time. Fortunately there is no sign of active infection at this time. No fevers, chills, nausea, vomiting, or diarrhea. With that being said he still has substantial wounds on the plantar aspects of his feet Theragen require quite a bit of work to get these to heal. He has been using Santyl currently though that is been problematic both in receiving the medication as well as actually paid for it as it is become quite expensive. Prior to the experience with Covid the  patient really did not have any major medical problems other than hypertension he does have some mild generalized weakness following the Covid experience. 07/22/2020 on evaluation today patient appears to be doing okay in regard to his foot ulcers I feel like the wound beds are showing signs of better improvement that I do believe the Iodoflex is helping in this regard. With that being said he does have a lot of drainage currently and this is somewhat blue/green in nature which is consistent with Pseudomonas. I do think a culture today would be appropriate for [place] to evaluate and see if that is indeed the case I would likely start him on antibiotic orally as well he is not allergic to Cipro knows of no issues he has had in the past 12/21; patient was admitted to the clinic earlier this month with bilateral presumed pressure ulcers on the bottom of his feet apparently related to excessive pressure from a tilt table arrangement in the intensive care unit. Patient relates this to being on ECMO but I am not really sure that is exactly related to that. I must say I have never seen anything like this. He has fairly extensive full-thickness wounds extending from his heel towards his midfoot mostly centered laterally. There is already been some healing distally. He does not appear to have an arterial issue. He has been using gentamicin to the wound surfaces with Iodoflex to help with ongoing debridement 1/6; this is a patient with pressure ulcers on the bottom of his feet related to excessive pressure from a standing position in the intensive care unit. He is complaining of a lot of pain in the right heel. He is not a diabetic. 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 We have been using Iodoflex to help prepare the surfaces of both wounds for an advanced treatment product. He is nonambulatory spending most of his time in a wheelchair I have asked him not to propel the wheelchair with  his heels 1/13; in general his wounds look better not much surface area change we have been using Iodoflex as of last week. I did an x-ray of the right heel as the patient was complaining of pain. I had some thoughts about a stress fracture perhaps Achilles tendon problems however what it showed was erosive changes along the inferior aspect of the calcaneus he now has a MRI booked for 1/20. 1/20; in general his wounds continue to be better. Some improvement in the large narrow areas proximally in his foot. He is still complaining of pain in the right  heel and tenderness in certain areas of this wound. His MRI is tonight. I am not just looking for osteomyelitis that was brought up on the x-ray I am wondering about stress fractures, tendon ruptures etc. He has no such findings on the left. Also noteworthy is that the patient had critical illness neuropathy and some of the discomfort may be actual improvement in nerve function I am just not sure. These wounds were initially in the setting of severe critical illness related to COVID-19. He was put in a standing position. He may have also been on pressors at the point contributing to tissue ischemia. By his description at some point these wounds were grossly necrotic extending proximally up into the Achilles part of his heel. I do not know that I have ever really seen pictures of them like this although they may exist in epic We have ordered Tri layer Oasis. I am trying to stimulate some granulation in these areas. This is of course assuming the MRI is negative for infection 1/27; since the patient was last here he saw Dr. Juleen [name] of infectious disease. He is planned for vancomycin and ceftriaxone. Prior operative culture grew MSSA. Also ordered baseline lab work. He also ordered arterial studies although the ABIs in our clinic were normal as well as his clinical exam these were normal I do not think he needs to see vascular surgery. His ABIs at the PTA  were 1.22 in the right triphasic waveforms with a normal TBI of 1.15 on the left ABI of 1.22 with triphasic waveforms and a normal TBI of 1.08. Finally he saw Dr. Amalia [name] who will follow him in for 2 months. At this point I do not think he felt that he needed a procedure on the right calcaneal bone. Dr. Juleen [name] is elected for broad-spectrum antibiotic The patient is still having pain in the right heel. He walks with a walker 2/3; wounds are generally smaller. He is tolerating his IV antibiotics. I believe this is vancomycin and ceftriaxone. We are still waiting for Oasis burn in terms of his out-of-pocket max which he should be meeting soon given the IV antibiotics, MRIs etc. I have asked him to check in on this. We are using silver collagen in the meantime the wounds look better 2/10; tolerating IV vancomycin and Rocephin. We are waiting to apply for Oasis. Although I am not really sure where he is in his out-of-pocket max. 2/17 started the first application of Oasis trilayer. Still on antibiotics. The wounds have generally look better. The area on the left has a little more surface slough requiring debridement 6/29; second application of Oasis trilayer. The wound surface granulation is generally look better. The area on the left with undermining laterally I think is come in a bit. 10/08/2020 upon evaluation today patient is here today for [employer] application #3. Fortunately he seems to be doing extremely well with regard to this and we are seeing a lot of new epithelial growth which is great news. Fortunately there is no signs of active infection at this time. 10/16/2020 upon evaluation today patient appears to be doing well with regard to his foot ulcers. Do believe the Oasis has been of benefit for him. I do not see any signs of infection right now which is great news and I think that he has a lot of new epithelial growth which is great to see as well. The patient is very pleased to hear all  of this. I do think we can  proceed with the Oasis trilayer #4 today. 3/18; not as much improvement in these areas on his heels that I was hoping. I did reapply trilateral Oasis today the tissue looks healthier but not as much fill in as I was hoping. 3/25; better looking today I think this is come in a bit the tissue looks healthier. Triple layer Oasis reapplied #6 4/1; somewhat better looking definitely better looking surface not as much change in surface area as I was hoping. He may be spending more time [place] on days then he needs to although he does have heel offloading boots. Triple layer Oasis reapplied #7 4/7; unfortunately apparently [name] will not approve any further Oasis which is unfortunate since the patient did respond nicely both in terms of the condition of the wound bed as well as surface area. There is still some drainage coming from the wound but not a lot there does not appear to be any infection 4/15; we have been using Hydrofera Blue. He continues to have nice rims of epithelialization on the right greater than the left. The left the epithelialization is coming from the tip of his heel. There is moderate drainage. In this that concerns me about a total contact cast. There is no evidence of infection 4/29; patient has been using Hydrofera Blue with dressing changes. He has no complaints or issues today. 5/5; using Hydrofera Blue. I actually think that he looks marginally better than the last time I saw this 3 weeks ago. There are rims of epithelialization on the left thumb coming from the medial side on the right. Using Hydrofera Blue 5/12; using Hydrofera Blue. These continue to make improvements in surface area. His drainage was not listed as severe I therefore went ahead and put a cast on the left foot. Right foot we will continue to dress his previous 5/16; back for first total contact cast change. He did not tolerate this particularly well cast injury on the  anterior tibia among other issues. Difficulty sleeping. I talked him about this in some detail and afterwards is elected to continue. I told him I would like to have a cast on for 3 weeks to see if this is going to help at all. I think he agreed 5/19; I think the wound is better. There is no tunneling towards his midfoot. The undermining medially also looks better. He has a rim of new skin distally. I think we are making progress here. The area on the left also continues to look somewhat better to me using Hydrofera Blue. He has a list of complaints about the cast but none of them seem serious 5/26; patient presents for 1 week follow-up. He has been using a total contact cast and tolerating this well. Hydrofera Blue is the main dressing used. He denies signs of infection. 6/2 Hydrofera Blue total contact cast on the left. These were large ulcers that formed in intensive care unit where the patient was recovering from [name]. May have had something to do with being ventilated in an upright positiono Pressors etc. We have been able to get the areas down considerably and a viable surface. There is some epithelialization in both sides. Note made of drainage 6/9; changed to [facility] last time because of drainage. He arrives with better looking surfaces and dimensions on the left than the right. Paradoxically the right actually probes more towards his midfoot the left is largely close down but both of these look improved. Using a total contact cast  on the left 6/16; complex wounds on his bilateral plantar heels which were initially pressure injury from a stay in the ICU with COVID. We have been using silver alginate most recently. His dimensions of come in quite dramatically however not recently. We have been putting the left foot in a total contact cast 6/23; complex wounds on the bilateral plantar heels. I been putting the left in the cast paradoxically the area on the right is the one that is going  towards closure at a faster rate. Quite a bit of drainage on the left. The patient went to see Dr. Amalia [name] who said he was going to [facility] for skin grafts. I had actually considered sending him for skin grafts however he would be mandatorily off his feet for a period of weeks to months. I am thinking that the area on the right is going to close on its own the area on the left has been more stubborn even though we have him in a total contact cast 6/30; took him out of a total contact cast last week is the right heel seem to be making better progress than the left where I was placing the cast. We are using silver alginate. Both wounds are smaller right greater than left 7/12; both wounds look as though they are making some progress. We are using silver alginate. Heel offloading boots 7/26; very gradual progress especially on the right. Using silver alginate. He is wearing heel offloading boots Objective Constitutional Patient is hypertensive.. Pulse regular and within target range for patient.Marland Kitchen Respirations regular, non-labored and within target range.. Temperature is normal and within the target range for the patient.Marland Kitchen Appears in no distress. Vitals Time Taken: 10:43 AM, Height: 69 in, Weight: 280 lbs, BMI: 41.3, Temperature: 89.3 F, Pulse: 97 bpm, Respiratory Rate: 20 breaths/min, Blood Pressure: 156/86 mmHg. General Notes: Wound exam; bilateral plantar heels. The especially on the right they continue to contract. Wound surface looks reasonable. Some epithelialization. No evidence of infection. No exposed bone or deep tissue Integumentary (Hair, Skin) Wound #1 status is Open. Original cause of wound was Pressure Injury. The date acquired was: 10/07/2019. The wound has been in treatment 32 weeks. The wound is located on the Right Calcaneus. The wound measures 3cm length x 1cm width x 0.1cm depth; 2.356cm^2 area and 0.236cm^3 volume. There is Fat Layer (Subcutaneous Tissue) exposed. There is no  tunneling or undermining noted. There is a medium amount of serosanguineous drainage noted. The wound margin is distinct with the outline attached to the wound base. There is large (67-100%) red, pink granulation within the wound bed. There is a small (1-33%) amount of necrotic tissue within the wound bed including Adherent Slough. Wound #2 status is Open. Original cause of wound was Pressure Injury. The date acquired was: 10/07/2019. The wound has been in treatment 32 weeks. The wound is located on the Left Calcaneus. The wound measures 4.2cm length x 1.3cm width x 0.6cm depth; 4.288cm^2 area and 2.573cm^3 volume. There is Fat Layer (Subcutaneous Tissue) exposed. There is no tunneling or undermining noted. There is a medium amount of serosanguineous drainage noted. The wound margin is thickened. There is large (67-100%) red, pale granulation within the wound bed. There is a small (1-33%) amount of necrotic tissue within the wound bed including Adherent Slough. Assessment Active Problems ICD-10 Non-pressure chronic ulcer of other part of right foot with fat layer exposed Pressure ulcer of other site, stage 3 Non-pressure chronic ulcer of other part of left foot with  fat layer exposed Plan Follow-up Appointments: Return appointment in 3 weeks. - Dr. Dellia Nims Bathing/ Shower/ Hygiene: May shower with protection but do not get wound dressing(s) wet. Edema Control - Lymphedema / SCD / Other: Elevate legs to the level of the heart or above for 30 minutes daily and/or when sitting, a frequency of: Avoid standing for long periods of time. Moisturize legs daily. - right leg and foot every night. Off-Loading: Wedge shoe to: - Glophed Shoe to both feet. Other: - keep pressure off of the bottom of your feet Additional Orders / Instructions: Follow Nutritious Diet WOUND #1: - Calcaneus Wound Laterality: Right Cleanser: Normal Saline (Generic) Every Other Day/30 Days Discharge Instructions: Cleanse the  wound with Normal Saline prior to applying a clean dressing using gauze sponges, not tissue or cotton balls. Cleanser: Wound Cleanser Every Other Day/30 Days Discharge Instructions: Cleanse the wound with wound cleanser prior to applying a clean dressing using gauze sponges, not tissue or cotton balls. Prim Dressing: KerraCel Ag Gelling Fiber Dressing, 2x2 in (silver alginate) (Generic) Every Other Day/30 Days ary Discharge Instructions: ****Ensure to apply into divot area of wound.**** Apply silver alginate to wound bed as instructed Secondary Dressing: Zetuvit Plus 4x8 in (Generic) Every Other Day/30 Days Discharge Instructions: Apply over primary dressing as directed. Secured With: Elastic Bandage 4 inch (ACE bandage) Every Other Day/30 Days Discharge Instructions: Secure with ACE bandage as directed. Secured With: [employer], 4.5x3.1 (in/yd) (Generic) Every Other Day/30 Days Discharge Instructions: Secure with Kerlix as directed. WOUND #2: - Calcaneus Wound Laterality: Left Cleanser: Normal Saline (Generic) Every Other Day/30 Days Discharge Instructions: Cleanse the wound with Normal Saline prior to applying a clean dressing using gauze sponges, not tissue or cotton balls. Cleanser: Wound Cleanser Every Other Day/30 Days Discharge Instructions: Cleanse the wound with wound cleanser prior to applying a clean dressing using gauze sponges, not tissue or cotton balls. Prim Dressing: KerraCel Ag Gelling Fiber Dressing, 2x2 in (silver alginate) (Generic) Every Other Day/30 Days ary Discharge Instructions: ****Ensure to apply into divot area of wound.**** Apply silver alginate to wound bed as instructed Secondary Dressing: Zetuvit Plus 4x8 in (Generic) Every Other Day/30 Days Discharge Instructions: Apply over primary dressing as directed. Secured With: Elastic Bandage 4 inch (ACE bandage) Every Other Day/30 Days Discharge Instructions: Secure with ACE bandage as directed. Secured With:  [employer], 4.5x3.1 (in/yd) (Generic) Every Other Day/30 Days Discharge Instructions: Secure with Kerlix as directed. 1. I am continuing with the silver alginate for now. We went through a period of Hydrofera Blue. He did receive a course of Dermagraft which is another good option might Byadda consider an advanced treatment option again Electronic Signature(s) Signed: 03/02/2021 5:43:33 PM By: Linton Ham MD Entered By: Linton Ham on 03/02/2021 12:21:18 -------------------------------------------------------------------------------- SuperBill Details Patient Name: Date of Service: [name], [place]. 03/02/2021 Medical Record Number: [id number] Patient Account Number: [id number] Date of Birth/Sex: Treating RN: November 21, 1975 (45 y.o. [name] Primary Care [name]: [name] Other Clinician: Referring [name]: Treating [name]/Extender: [name] in Treatment: 32 Diagnosis Coding ICD-10 Codes Code Description [id number] Non-pressure chronic ulcer of other part of right foot with fat layer exposed L89.893 Pressure ulcer of other site, stage 3 L97.522 Non-pressure chronic ulcer of other 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 Facility Procedures CPT4 Code: [id number] Description: [place] VISIT-LEV 3 EST PT Modifier: Quantity: 1 Physician Procedures Electronic Signature(s) Signed: 03/02/2021 5:43:33 PM By: Linton Ham MD Entered  By: Linton Ham on 03/02/2021 12:21:41

## 2021-03-03 ENCOUNTER — Other Ambulatory Visit: Payer: Self-pay

## 2021-03-03 ENCOUNTER — Encounter: Payer: Self-pay | Admitting: Physical Medicine and Rehabilitation

## 2021-03-03 ENCOUNTER — Encounter
Payer: BC Managed Care – PPO | Attending: Physical Medicine and Rehabilitation | Admitting: Physical Medicine and Rehabilitation

## 2021-03-03 VITALS — BP 122/72 | HR 82 | Temp 99.1°F | Ht 68.5 in

## 2021-03-03 DIAGNOSIS — R0609 Other forms of dyspnea: Secondary | ICD-10-CM | POA: Diagnosis not present

## 2021-03-03 DIAGNOSIS — G7281 Critical illness myopathy: Secondary | ICD-10-CM | POA: Insufficient documentation

## 2021-03-03 DIAGNOSIS — G6281 Critical illness polyneuropathy: Secondary | ICD-10-CM | POA: Insufficient documentation

## 2021-03-03 DIAGNOSIS — G894 Chronic pain syndrome: Secondary | ICD-10-CM | POA: Diagnosis not present

## 2021-03-03 DIAGNOSIS — U099 Post covid-19 condition, unspecified: Secondary | ICD-10-CM | POA: Diagnosis not present

## 2021-03-03 DIAGNOSIS — Z6841 Body Mass Index (BMI) 40.0 and over, adult: Secondary | ICD-10-CM | POA: Insufficient documentation

## 2021-03-03 MED ORDER — TRAZODONE HCL 100 MG PO TABS: 100.0000 mg | ORAL_TABLET | Freq: Every day | ORAL | 3 refills | Status: DC

## 2021-03-03 MED ORDER — HYDROCODONE-ACETAMINOPHEN 5-325 MG PO TABS: 1.0000 | ORAL_TABLET | Freq: Four times a day (QID) | ORAL | 0 refills | Status: DC | PRN

## 2021-03-03 MED ORDER — ARIPIPRAZOLE 2 MG PO TABS: 2.0000 mg | ORAL_TABLET | Freq: Every day | ORAL | 3 refills | Status: DC

## 2021-03-03 NOTE — Progress Notes (Signed)
Subjective:    Patient ID: [name], male    DOB: [birth year], 45 y.o.   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 Also has L foot drop.   Here for f/u on long COVID/foot drop on L.   Did ask about hyperbaric O2- said he's on the right course- did mention plastic surgery for wounds on feet.   Would have to do bird baths- and be off feet- completely NWB for likely 8 weeks and then could WB again- but pt done with surgery.   Dr Dellia Nims "likes what he saw". But no pics in Wound care notes.  Got some debridement yesterday on R foot where got callused a little.   Size of 1/2 dollar coin wounds B/L feet.   Needs eyeglasses- just got eyes checked- ordered both EGS and sunglasses.    Hasn't taken much in terms of pain meds- Norco- Got 30 pills 5/27- but last dose 2 weeks ago- usually taking extra strength tylenol.  And it works.   Question: Wondering some of his meds causing diarrhea/upset stomach? Not on anything causing loose stools.  Regular amount, but loose stools.  Or could be overeating? Per wife.    Thinks depression is still an issue.   Will be 1 year in August-  Doesn't have picture. But can send me pic  Pain Inventory Average Pain 4 Pain Right Now 4 My pain is intermittent and sharp  LOCATION OF PAIN  Back, Knees, Feet, Legs  BOWEL Number of stools per week: 8 Oral laxative use No  Type of laxative none Enema or suppository use No  History of colostomy No  Incontinent No   BLADDER Normal In and out cath, frequency n/a Able to self cath No  Bladder incontinence No  Frequent urination No  Leakage with coughing No  Difficulty starting stream  sometimes Incomplete bladder emptying No    Mobility use a walker how many minutes can you walk? 2 mins ability to climb steps?  yes do you  drive?  yes use a wheelchair Do you have any goals in this area?  yes  Function disabled: date disabled 07/22/2020 I need assistance with the following:  bathing, meal prep, and household duties Do you have any goals in this area?  yes  Neuro/Psych weakness numbness trouble walking  Prior Studies Any changes since last visit?  no  Physicians involved in your care Any changes since last visit?  no   Family History  Problem Relation Age of Onset   Asthma Brother    Diabetes Father    Hypertension Father    Diabetes Paternal Grandmother    Hypertension Paternal Grandmother    Migraines Mother    GER disease Mother    Pancreatic cancer Maternal Grandmother    Heart disease Maternal Grandfather    Social History   Socioeconomic History   Marital status: Married    Spouse name: Not on file   Number of children: 1   Years of education: Not on file   Highest education level: Not on file  Occupational History   Occupation: delivery driver  Tobacco Use   Smoking status: Never   Smokeless tobacco: Never  Vaping Use   Vaping Use: Never used  Substance and Sexual Activity   Alcohol use:  Yes    Comment: rarely 2-3 times a year   Drug use: No   Sexual activity: Yes    Partners: Female  Other Topics Concern   Not on file  Social History Narrative   Not on file   Social Determinants of Health   Financial Resource Strain: Not on file  Food Insecurity: Not on file  Transportation Needs: Not on file  Physical Activity: Not on file  Stress: Not on file  Social Connections: Not on file   Past Surgical History:  Procedure Laterality Date   CANNULATION FOR ECMO (EXTRACORPOREAL MEMBRANE OXYGENATION) N/A 08/28/2019   Procedure: CANNULATION FOR VV ECMO (EXTRACORPOREAL MEMBRANE OXYGENATION);  Surgeon: Prescott Gum, Collier Salina, MD;  Location: [place];  Service: Open Heart Surgery;  Laterality: N/A;  CRESCENT CANNULA   CANNULATION FOR ECMO (EXTRACORPOREAL MEMBRANE OXYGENATION) N/A  09/10/2019   Procedure: CANNULATION FOR ECMO (EXTRACORPOREAL MEMBRANE OXYGENATION) PUTTING IN CRESCENT 32FR CANNULA  AND REMOVING GROING CANNULATION;  Surgeon: Wonda Olds, MD;  Location: [place];  Service: Open Heart Surgery;  Laterality: N/A;  PUTTING IN CRESCENT/REMOVING GROIN CANNULATION   CYSTOSCOPY/URETEROSCOPY/HOLMIUM LASER/STENT PLACEMENT Left 04/18/2019   Procedure: LEFT URETEROSCOPY/HOLMIUM LASER/STENT PLACEMENT;  Surgeon: Ardis Hughs, MD;  Location[facility];  Service: Urology;  Laterality: Left;   CYSTOSCOPY/URETEROSCOPY/HOLMIUM LASER/STENT PLACEMENT Left 05/02/2019   Procedure: CYSTOSCOPY/URETEROSCOPY/HOLMIUM LASER/STENT EXCHANGE;  Surgeon: Ardis Hughs, MD;  Location: [facility];  Service: Urology;  Laterality: Left;   ECMO CANNULATION N/A 08/03/2019   Procedure: ECMO CANNULATION;  Surgeon: Wonda Olds, MD;  Location: [place] [facility];  Service: Cardiovascular;  Laterality: N/A;   ESOPHAGOGASTRODUODENOSCOPY N/A 09/11/2019   Procedure: ESOPHAGOGASTRODUODENOSCOPY (EGD);  Surgeon: Wonda Olds, MD;  Location: [facility];  Service: Thoracic;  Laterality: N/A;   GRAFT APPLICATION Bilateral 5/92/9244   Procedure: APPLICATION OF SKIN GRAFT BILATERAL FEET;  Surgeon: Edrick Kins, DPM;  Location: [facility];  Service: Podiatry;  Laterality: Bilateral;   IR REPLC GASTRO/COLONIC TUBE PERCUT W/FLUORO  10/14/2019   IRRIGATION AND DEBRIDEMENT SHOULDER Right 09/29/2017   Procedure: IRRIGATION AND DEBRIDEMENT SHOULDER;  Surgeon: Leandrew Koyanagi, MD;  Location: [place];  Service: Orthopedics;  Laterality: Right;   LUMBAR DISC SURGERY  2002   NASAL ENDOSCOPY WITH EPISTAXIS CONTROL N/A 08/31/2019   Procedure: NASAL ENDOSCOPY WITH EPISTAXIS CONTROL WITH CAUTERIZATION;  Surgeon: Melida Quitter, MD;  Location: [place];  Service: ENT;  Laterality: N/A;   PORTACATH PLACEMENT N/A 09/11/2019   Procedure: PEG TUBE INSERTION - BEDSIDE;  Surgeon: Wonda Olds, MD;  Location: [place];   Service: Thoracic;  Laterality: N/A;   TEE WITHOUT CARDIOVERSION N/A 08/28/2019   Procedure: TRANSESOPHAGEAL ECHOCARDIOGRAM (TEE);  Surgeon: Prescott Gum, Collier Salina, MD;  Location: [place];  Service: Open Heart Surgery;  Laterality: N/A;   TEE WITHOUT CARDIOVERSION N/A 09/10/2019   Procedure: TRANSESOPHAGEAL ECHOCARDIOGRAM (TEE);  Surgeon: Wonda Olds, MD;  Location: [place];  Service: Open Heart Surgery;  Laterality: N/A;   WOUND DEBRIDEMENT Bilateral 03/27/2020   Procedure: EXCISIONAL DEBRIDEMENT OF ULCERS BILATERAL FEET;  Surgeon: Edrick Kins, DPM;  Location: [facility];  Service: Podiatry;  Laterality: Bilateral;   Past Medical History:  Diagnosis Date   Anginal pain ([place])    with covid   Anxiety    Asthma    Dyspnea    GERD (gastroesophageal reflux disease)    Headache    History of kidney stones    LEFT URETERAL STONE   HTN (hypertension)  Pancreatitis 2018   GALLBALDDER SLUDGE CAUSED ISSUED RESOLVED   Pneumonia 07/2019   covid   BP 122/72   Pulse 82   Temp 99.1 F (37.3 C)   Ht 5' 8.5" (1.74 m)   SpO2 93%   BMI 47.05 kg/m   Opioid Risk Score:   Fall Risk Score:  `1  Depression screen PHQ 2/9  Depression screen [facility] 2/9 03/03/2021 01/01/2021 11/04/2020 10/15/2020 09/09/2020 07/01/2020 04/15/2020  Decreased Interest 1 0 1 0 1 0 -  Down, Depressed, Hopeless 1 0 1 1 1  0 -  PHQ - 2 Score 2 0 2 1 2  0 -  Altered sleeping - - - - - - -  Tired, decreased energy - - - - - - -  Change in appetite - - - - - - 1  Feeling bad or failure about yourself  - - - - - - 1  Trouble concentrating - - - - - - -  Moving slowly or fidgety/restless - - - - - - -  Suicidal thoughts - - - - - - -  PHQ-9 Score - - - - - - -  Difficult doing work/chores - - - - - - -  Some recent data might be hidden    Review of Systems  Constitutional:        Weight gain  Respiratory:  Positive for cough and wheezing.   Gastrointestinal:  Positive for diarrhea.  All other systems reviewed and are negative.      Objective:   Physical Exam  Awake, alert, appropriate, accompanied by wife; in manual w/c, feet wrapped in ACE wraps and wearing sandals, NAD Big scab that's picked at it- L distal knee.   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 Also has L foot drop.   Here for f/u on long COVID/foot drop on L.  Will needs to send me pics of feet- can send through Mychart.  Might have IBS- no blood with stool, so doesn't think need to send to GI. I think it's due to his long COVID.  Also has stronger ileocolic reflex- can have lactose free milk- can also be lactose intolerant, which makes sense. .  4. Suggest speaking with PCP to discuss Rx weight loss drug. Needs to make appointment.  5.  Has bored eating- Needs hobby- used to like drawing.  Lego's  or 3D puzzles.  6.  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  Really, when cannot get out of w/c due to feet.  7. If this works Abilify works really well, then maybe can come off Duloxetine- - feels stuck which is normal.  8. F/U in 3 months 9. Has appointment with Dr Lyman Speller in August.  10. Call me in 6 weeks so can touch base on how he's doing.

## 2021-03-03 NOTE — Patient Instructions (Addendum)
  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 Also has L foot drop.   Here for f/u on long COVID/foot drop on L.  Will needs to send me pics of feet- can send through Mychart.  Might have IBS- no blood with stool, so doesn't think need to send to GI. I think it's due to his long COVID.  Also has stronger ileocolic reflex- can have lactose free milk- can also be lactose intolerant, which makes sense. .  4. Suggest speaking with PCP to discuss Rx weight loss drug. Needs to make appointment.  5.  Has bored eating- Needs hobby- used to like drawing.  Lego's  or 3D puzzles.  6.  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  Really, when cannot get out of w/c due to feet.  7. If this works Abilify works really well, then maybe can come off Duloxetine- - feels stuck which is normal.  8. F/U in 3 months 9. Has appointment with Dr Lyman Speller in August 10. Call me to let me know how things going. In 6 weeks.

## 2021-03-04 ENCOUNTER — Encounter (HOSPITAL_BASED_OUTPATIENT_CLINIC_OR_DEPARTMENT_OTHER): Payer: BC Managed Care – PPO | Admitting: Internal Medicine

## 2021-03-05 ENCOUNTER — Telehealth: Payer: Self-pay | Admitting: *Deleted

## 2021-03-05 NOTE — Telephone Encounter (Signed)
Needs advice how to upload pictures to mychart.

## 2021-03-08 NOTE — Progress Notes (Signed)
[name] ([id number]) Visit Report for 03/02/2021 Arrival Information Details Patient Name: Date of Service: [name], [name] 03/02/2021 10:30 A M Medical Record Number: [id number] Patient Account Number: [id number] Date of Birth/Sex: Treating RN: 1976/01/15 (45 y.o. [name], [name] Primary Care [name]: [name] Other Clinician: Referring [name]: Treating [name]/Extender: [name] in Treatment: 55 Visit Information History Since Last Visit Added or deleted any medications: No Patient Arrived: Wheel Chair Any new allergies or adverse reactions: No Arrival Time: 10:39 Had a fall or experienced change in No Accompanied By: wife activities of daily living that may affect Transfer Assistance: None risk of falls: Patient Identification Verified: Yes Signs or symptoms of abuse/neglect since last visito No Secondary Verification Process Completed: Yes Hospitalized since last visit: No Patient Requires Transmission-Based Precautions: No Implantable device outside of the clinic excluding No Patient Has Alerts: No cellular tissue based products placed in the center since last visit: Has Dressing in Place as Prescribed: Yes Has Footwear/Offloading in Place as Prescribed: Yes Left: Wedge Shoe Right: Wedge Shoe Pain Present Now: No Electronic Signature(s) Signed: 03/02/2021 5:46:34 PM By: [name] Entered By: [name] on 03/02/2021 10:40:46 -------------------------------------------------------------------------------- Clinic Level of Care Assessment Details Patient Name: Date of Service: [name] [name] 03/02/2021 10:30 A M Medical Record Number: [id number] Patient Account Number: [id number] Date of Birth/Sex: Treating RN: 1975/09/07 (45 y.o. [name] Primary Care [name]: [name] Other Clinician: Referring [name]: Treating [name]/Extender: [name] in Treatment: 32 Clinic Level of  Care Assessment Items TOOL 4 Quantity Score X- 1 0 Use when only an EandM is performed on FOLLOW-UP visit ASSESSMENTS - Nursing Assessment / Reassessment X- 1 10 Reassessment of Co-morbidities (includes updates in patient status) X- 1 5 Reassessment of Adherence to Treatment Plan ASSESSMENTS - Wound and Skin A ssessment / Reassessment []  - 0 Simple Wound Assessment / Reassessment - one wound X- 2 5 Complex Wound Assessment / Reassessment - multiple wounds []  - 0 Dermatologic / Skin Assessment (not related to wound area) ASSESSMENTS - Focused Assessment []  - 0 Circumferential Edema Measurements - multi extremities []  - 0 Nutritional Assessment / Counseling / Intervention []  - 0 Lower Extremity Assessment (monofilament, tuning fork, pulses) []  - 0 Peripheral Arterial Disease Assessment (using hand held doppler) ASSESSMENTS - Ostomy and/or Continence Assessment and Care []  - 0 Incontinence Assessment and Management []  - 0 Ostomy Care Assessment and Management (repouching, etc.) PROCESS - Coordination of Care X - Simple Patient / Family Education for ongoing care 1 15 []  - 0 Complex (extensive) Patient / Family Education for ongoing care X- 1 10 Staff obtains Programmer, systems, Records, T Results / Process Orders est X- 1 10 Staff telephones HHA, Nursing Homes / Clarify orders / etc []  - 0 Routine Transfer to another Facility (non-emergent condition) []  - 0 Routine Hospital Admission (non-emergent condition) []  - 0 New Admissions / Biomedical engineer / Ordering NPWT Apligraf, etc. , []  - 0 Emergency Hospital Admission (emergent condition) []  - 0 Simple Discharge Coordination []  - 0 Complex (extensive) Discharge Coordination PROCESS - Special Needs []  - 0 Pediatric / Minor Patient Management []  - 0 Isolation Patient Management []  - 0 Hearing / Language / Visual special needs []  - 0 Assessment of Community assistance (transportation, D/C planning, etc.) []  -  0 Additional assistance / Altered mentation []  - 0 Support Surface(s) Assessment (bed, cushion, seat, etc.) INTERVENTIONS - Wound Cleansing / Measurement []  - 0 Simple  Wound Cleansing - one wound X- 2 5 Complex Wound Cleansing - multiple wounds X- 1 5 Wound Imaging (photographs - any number of wounds) []  - 0 Wound Tracing (instead of photographs) []  - 0 Simple Wound Measurement - one wound []  - 0 Complex Wound Measurement - multiple wounds INTERVENTIONS - Wound Dressings []  - 0 Small Wound Dressing one or multiple wounds X- 2 15 Medium Wound Dressing one or multiple wounds []  - 0 Large Wound Dressing one or multiple wounds []  - 0 Application of Medications - topical []  - 0 Application of Medications - injection INTERVENTIONS - Miscellaneous []  - 0 External ear exam []  - 0 Specimen Collection (cultures, biopsies, blood, body fluids, etc.) []  - 0 Specimen(s) / Culture(s) sent or taken to Lab for analysis []  - 0 Patient Transfer (multiple staff / Civil Service fast streamer / Similar devices) []  - 0 Simple Staple / Suture removal (25 or less) []  - 0 Complex Staple / Suture removal (26 or more) []  - 0 Hypo / Hyperglycemic Management (close monitor of Blood Glucose) []  - 0 Ankle / Brachial Index (ABI) - do not check if billed separately X- 1 5 Vital Signs Has the patient been seen at the hospital within the last three years: Yes Total Score: 110 Level Of Care: New/Established - Level 3 Electronic Signature(s) Signed: 03/02/2021 5:08:38 PM By: [name] Gouty RN, BSN Entered By: [name] on 03/02/2021 11:23:10 -------------------------------------------------------------------------------- Encounter Discharge Information Details Patient Name: Date of Service: [name], [name] E. 03/02/2021 10:30 A M Medical Record Number: [id number] Patient Account Number: [id number] Date of Birth/Sex: Treating RN: 06/09/1976 (45 y.o. [name] Primary Care [name]: [name] Other  Clinician: Referring [name]: Treating [name]/Extender: [name] in Treatment: 32 Encounter Discharge Information Items Discharge Condition: Stable Ambulatory Status: Wheelchair Discharge Destination: Home Transportation: Private Auto Accompanied By: son Schedule Follow-up Appointment: Yes Clinical Summary of Care: Electronic Signature(s) Signed: 03/02/2021 5:46:34 PM By: [name] Entered By: [name] on 03/02/2021 11:42:54 -------------------------------------------------------------------------------- Lower Extremity Assessment Details Patient Name: Date of Service: [name] [place] 03/02/2021 10:30 A M Medical Record Number: [id number] Patient Account Number: [id number] Date of Birth/Sex: Treating RN: 1976/01/08 (45 y.o. [name] Primary Care Ivorie Uplinger: [name] Other Clinician: Referring Jeanett Antonopoulos: Treating Sahiti Joswick/Extender: [name] in Treatment: 32 Edema Assessment Assessed: [name]: Yes] [name]: Yes] Edema: [Left: Yes] [Right: Yes] Calf Left: Right: Point of Measurement: 29 cm From Medial Instep 42 cm 41 cm Ankle Left: Right: Point of Measurement: 9 cm From Medial Instep 27.5 cm 25 cm Vascular Assessment Pulses: Dorsalis Pedis Palpable: [Left:Yes] Electronic Signature(s) Signed: 03/02/2021 5:46:34 PM By: [name] Entered By: [name] on 03/02/2021 10:43:14 -------------------------------------------------------------------------------- Multi Wound Chart Details Patient Name: Date of Service: [name], [place] 03/02/2021 10:30 A M Medical Record Number: [id number] Patient Account Number: [id number] Date of Birth/Sex: Treating RN: Apr 10, 1976 (45 y.o. [name], [name] Primary Care Long [place]image: [name] Other Clinician: Referring Ivanell Deshotel: Treating Airyana Sprunger/Extender: [name] in Treatment: 32 Vital Signs Height(in): 65 Pulse(bpm): 67 Weight(lbs):  280 Blood Pressure(mmHg): 156/86 Body Mass Index(BMI): 41 Temperature(F): 89.3 Respiratory Rate(breaths/min): 20 Photos: [N/A:N/A] Right Calcaneus Left Calcaneus N/A Wound Location: Pressure Injury Pressure Injury N/A Wounding Event: Pressure Ulcer Pressure Ulcer N/A Primary Etiology: Asthma, Angina, Hypertension Asthma, Angina, Hypertension N/A Comorbid History: 10/07/2019 10/07/2019 N/A Date Acquired: 61 32 N/A Weeks of Treatment: Open Open N/A Wound Status: 3x1x0.1 4.2x1.3x0.6 N/A Measurements L x W x  D (cm) 2.356 4.288 N/A A (cm) : rea 0.236 2.573 N/A Volume (cm) : 92.50% 84.20% N/A % Reduction in A rea: 99.30% 90.50% N/A % Reduction in Volume: Category/Stage III Category/Stage III N/A Classification: Medium Medium N/A Exudate A mount: Serosanguineous Serosanguineous N/A Exudate Type: red, brown red, brown N/A Exudate Color: Distinct, outline attached Thickened N/A Wound Margin: Large (67-100%) Large (67-100%) N/A Granulation A mount: Red, Pink Red, Pale N/A Granulation Quality: Small (1-33%) Small (1-33%) N/A Necrotic A mount: Fat Layer (Subcutaneous Tissue): Yes Fat Layer (Subcutaneous Tissue): Yes N/A Exposed Structures: Fascia: No Fascia: No Tendon: No Tendon: No Muscle: No Muscle: No Joint: No Joint: No Bone: No Bone: No Medium (34-66%) Small (1-33%) N/A Epithelialization: Treatment Notes Wound #1 (Calcaneus) Wound Laterality: Right Cleanser Normal Saline Discharge Instruction: Cleanse the wound with Normal Saline prior to applying a clean dressing using gauze sponges, not tissue or cotton balls. Wound Cleanser Discharge Instruction: Cleanse the wound with wound cleanser prior to applying a clean dressing using gauze sponges, not tissue or cotton balls. Peri-Wound Care Topical Primary Dressing KerraCel Ag Gelling Fiber Dressing, 2x2 in (silver alginate) Discharge Instruction: ****Ensure to apply into divot area of wound.**** Apply  silver alginate to wound bed as instructed Secondary Dressing Zetuvit Plus 4x8 in Discharge Instruction: Apply over primary dressing as directed. Secured With Elastic Bandage 4 inch (ACE bandage) Discharge Instruction: Secure with ACE bandage as directed. Kerlix Roll Sterile, 4.5x3.1 (in/yd) Discharge Instruction: Secure with Kerlix as directed. Compression Wrap Compression Stockings Add-Ons Wound #2 (Calcaneus) Wound Laterality: Left Cleanser Normal Saline Discharge Instruction: Cleanse the wound with Normal Saline prior to applying a clean dressing using gauze sponges, not tissue or cotton balls. Wound Cleanser Discharge Instruction: Cleanse the wound with wound cleanser prior to applying a clean dressing using gauze sponges, not tissue or cotton balls. Peri-Wound Care Topical Primary Dressing KerraCel Ag Gelling Fiber Dressing, 2x2 in (silver alginate) Discharge Instruction: ****Ensure to apply into divot area of wound.**** Apply silver alginate to wound bed as instructed Secondary Dressing Zetuvit Plus 4x8 in Discharge Instruction: Apply over primary dressing as directed. Secured With Elastic Bandage 4 inch (ACE bandage) Discharge Instruction: Secure with ACE bandage as directed. Kerlix Roll Sterile, 4.5x3.1 (in/yd) Discharge Instruction: Secure with Kerlix as directed. Compression Wrap Compression Stockings Add-Ons Electronic Signature(s) Signed: 03/02/2021 5:43:33 PM By: Linton Ham MD Signed: 03/02/2021 5:55:16 PM By: [name] RN Entered By: Linton Ham on 03/02/2021 12:15:21 -------------------------------------------------------------------------------- Multi-Disciplinary Care Plan Details Patient Name: Date of Service: [name], [place] 03/02/2021 10:30 A M Medical Record Number: [id number] Patient Account Number: [id number] Date of Birth/Sex: Treating RN: March 13, 1976 (45 y.o. [name] Primary Care Cristian Davitt: [name] Other  Clinician: Referring Laquenta Whitsell: Treating Shivan Hodes/Extender: [name] in Treatment: [place] reviewed with physician Active Inactive Abuse / Safety / Falls / Self Care Management Nursing Diagnoses: Potential for falls Goals: Patient/caregiver will verbalize/demonstrate measures taken to prevent injury and/or falls Date Initiated: 07/15/2020 Target Resolution Date: 03/05/2021 Goal Status: Active Interventions: Assess fall risk on admission and as needed Assess impairment of mobility on admission and as needed per policy Notes: Electronic Signature(s) Signed: 03/02/2021 5:08:38 PM By: [name] Gouty RN, BSN Entered By: [name] on 03/02/2021 11:19:08 -------------------------------------------------------------------------------- Pain Assessment Details Patient Name: Date of Service: [name], [place]. 03/02/2021 10:30 A M Medical Record Number: [id number] Patient Account Number: [id number] Date of Birth/Sex: Treating RN: [birth year] (45 y.o. [name] Primary Care Ferron Ishmael: Jacalyn Lefevre,  Mickel Baas Other Clinician: Referring Danny Zimny: Treating Fawna Cranmer/Extender: [name] in Treatment: 32 Active Problems Location of Pain Severity and Description of Pain Patient Has Paino No Site Locations Pain Management and Medication Current Pain Management: Electronic Signature(s) Signed: 03/02/2021 5:46:34 PM By: [name] Entered By: [name] on 03/02/2021 10:42:24 -------------------------------------------------------------------------------- Patient/Caregiver Education Details Patient Name: Date of Service: [name] 7/26/2022andnbsp10:30 [place] Record Number: [id number] Patient Account Number: [id number] Date of Birth/Gender: Treating RN: 07/21/1976 (45 y.o. [name] Primary Care Physician: [name] Other Clinician: Referring Physician: Treating Physician/Extender: [name] in Treatment: 77 Education Assessment Education Provided To: Patient Education Topics Provided Wound/Skin Impairment: Methods: Explain/Verbal Responses: State content correctly [employer]) Signed: 03/02/2021 5:08:38 PM By: [name] Gouty RN, BSN Entered By: [name] on 03/02/2021 11:19:24 -------------------------------------------------------------------------------- Wound Assessment Details Patient Name: Date of Service: [name], [place] 03/02/2021 10:30 A M Medical Record Number: [id number] Patient Account Number: [id number] Date of Birth/Sex: Treating RN: [birth year] (45 y.o. [name] Primary Care Kerby Borner: [name] Other Clinician: Referring Idan Prime: Treating Shalaunda Weatherholtz/Extender: [name] in Treatment: 32 Wound Status Wound Number: 1 Primary Etiology: Pressure Ulcer Wound Location: Right Calcaneus Wound Status: Open Wounding Event: Pressure Injury Comorbid History: Asthma, Angina, Hypertension Date Acquired: 10/07/2019 Weeks Of Treatment: 32 Clustered Wound: No Photos Wound Measurements Length: (cm) 3 Width: (cm) 1 Depth: (cm) 0.1 Area: (cm) 2.356 Volume: (cm) 0.236 % Reduction in Area: 92.5% % Reduction in Volume: 99.3% Epithelialization: Medium (34-66%) Tunneling: No Undermining: No Wound Description Classification: Category/Stage III Wound Margin: Distinct, outline attached Exudate Amount: Medium Exudate Type: Serosanguineous Exudate Color: red, brown Foul Odor After Cleansing: No Slough/Fibrino No Wound Bed Granulation Amount: Large (67-100%) Exposed Structure Granulation Quality: Red, Pink Fascia Exposed: No Necrotic Amount: Small (1-33%) Fat Layer (Subcutaneous Tissue) Exposed: Yes Necrotic Quality: Adherent Slough Tendon Exposed: No Muscle Exposed: No Joint Exposed: No Bone Exposed: No Treatment Notes Wound #1 (Calcaneus) Wound Laterality:  Right Cleanser Normal Saline Discharge Instruction: Cleanse the wound with Normal Saline prior to applying a clean dressing using gauze sponges, not tissue or cotton balls. Wound Cleanser Discharge Instruction: Cleanse the wound with wound cleanser prior to applying a clean dressing using gauze sponges, not tissue or cotton balls. Peri-Wound Care Topical Primary Dressing KerraCel Ag Gelling Fiber Dressing, 2x2 in (silver alginate) Discharge Instruction: ****Ensure to apply into divot area of wound.**** Apply silver alginate to wound bed as instructed Secondary Dressing Zetuvit Plus 4x8 in Discharge Instruction: Apply over primary dressing as directed. Secured With Elastic Bandage 4 inch (ACE bandage) Discharge Instruction: Secure with ACE bandage as directed. Kerlix Roll Sterile, 4.5x3.1 (in/yd) Discharge Instruction: Secure with Kerlix as directed. Compression Wrap Compression Stockings Add-Ons Electronic Signature(s) Signed: 03/02/2021 5:08:38 PM By: [name] Gouty RN, BSN Signed: 03/08/2021 1:33:39 PM By: Sandre Kitty Entered By: Sandre Kitty on 03/02/2021 10:53:46 -------------------------------------------------------------------------------- Wound Assessment Details Patient Name: Date of Service: [name], [place] 03/02/2021 10:30 A M Medical Record Number: [id number] Patient Account Number: [id number] Date of Birth/Sex: Treating RN: 1976-07-08 (45 y.o. [name] Primary Care Trenda Corliss: [name] Other Clinician: Referring Katherleen Folkes: Treating Jameca Chumley/Extender: [name] in Treatment: 32 Wound Status Wound Number: 2 Primary Etiology: Pressure Ulcer Wound Location: Left Calcaneus Wound Status: Open Wounding Event: Pressure Injury Comorbid History: Asthma, Angina, Hypertension Date Acquired: 10/07/2019 Weeks Of Treatment: 32 Clustered Wound: No Photos Wound Measurements Length: (cm) 4.2 Width: (cm)  1.3 Depth: (cm)  0.6 Area: (cm) 4.288 Volume: (cm) 2.573 % Reduction in Area: 84.2% % Reduction in Volume: 90.5% Epithelialization: Small (1-33%) Tunneling: No Undermining: No Wound Description Classification: Category/Stage III Wound Margin: Thickened Exudate Amount: Medium Exudate Type: Serosanguineous Exudate Color: red, brown Foul Odor After Cleansing: No Slough/Fibrino Yes Wound Bed Granulation Amount: Large (67-100%) Exposed Structure Granulation Quality: Red, Pale Fascia Exposed: No Necrotic Amount: Small (1-33%) Fat Layer (Subcutaneous Tissue) Exposed: Yes Necrotic Quality: Adherent Slough Tendon Exposed: No Muscle Exposed: No Joint Exposed: No Bone Exposed: No Treatment Notes Wound #2 (Calcaneus) Wound Laterality: Left Cleanser Normal Saline Discharge Instruction: Cleanse the wound with Normal Saline prior to applying a clean dressing using gauze sponges, not tissue or cotton balls. Wound Cleanser Discharge Instruction: Cleanse the wound with wound cleanser prior to applying a clean dressing using gauze sponges, not tissue or cotton balls. Peri-Wound Care Topical Primary Dressing KerraCel Ag Gelling Fiber Dressing, 2x2 in (silver alginate) Discharge Instruction: ****Ensure to apply into divot area of wound.**** Apply silver alginate to wound bed as instructed Secondary Dressing Zetuvit Plus 4x8 in Discharge Instruction: Apply over primary dressing as directed. Secured With Elastic Bandage 4 inch (ACE bandage) Discharge Instruction: Secure with ACE bandage as directed. Kerlix Roll Sterile, 4.5x3.1 (in/yd) Discharge Instruction: Secure with Kerlix as directed. Compression Wrap Compression Stockings Add-Ons Electronic Signature(s) Signed: 03/02/2021 5:08:38 PM By: [name] Gouty RN, BSN Signed: 03/08/2021 1:33:39 PM By: Sandre Kitty Entered By: Sandre Kitty on 03/02/2021  10:54:46 -------------------------------------------------------------------------------- Vitals Details Patient Name: Date of Service: [name], [place] 03/02/2021 10:30 A M Medical Record Number: [id number] Patient Account Number: [id number] Date of Birth/Sex: Treating RN: [birth year] (45 y.o. [name] Primary Care Laurabeth Yip: [name] Other Clinician: Referring Jayleah Garbers: Treating Ladarren Steiner/Extender: [name] in Treatment: 32 Vital Signs Time Taken: 10:43 Temperature (F): 89.3 Height (in): 69 Pulse (bpm): 97 Weight (lbs): 280 Respiratory Rate (breaths/min): 20 Body Mass Index (BMI): 41.3 Blood Pressure (mmHg): 156/86 Reference Range: 80 - 120 mg / dl Electronic Signature(s) Signed: 03/02/2021 5:46:34 PM By: [name] Entered By: [name] on 03/02/2021 10:44:06

## 2021-03-09 DIAGNOSIS — U071 COVID-19: Secondary | ICD-10-CM | POA: Diagnosis not present

## 2021-03-09 DIAGNOSIS — G4734 Idiopathic sleep related nonobstructive alveolar hypoventilation: Secondary | ICD-10-CM | POA: Diagnosis not present

## 2021-03-16 DIAGNOSIS — G7281 Critical illness myopathy: Secondary | ICD-10-CM | POA: Diagnosis not present

## 2021-03-16 DIAGNOSIS — M6281 Muscle weakness (generalized): Secondary | ICD-10-CM | POA: Diagnosis not present

## 2021-03-16 DIAGNOSIS — M6259 Muscle wasting and atrophy, not elsewhere classified, multiple sites: Secondary | ICD-10-CM | POA: Diagnosis not present

## 2021-03-16 DIAGNOSIS — Z741 Need for assistance with personal care: Secondary | ICD-10-CM | POA: Diagnosis not present

## 2021-03-23 DIAGNOSIS — F4320 Adjustment disorder, unspecified: Secondary | ICD-10-CM | POA: Diagnosis not present

## 2021-03-23 DIAGNOSIS — Z8616 Personal history of COVID-19: Secondary | ICD-10-CM | POA: Diagnosis not present

## 2021-03-25 ENCOUNTER — Other Ambulatory Visit: Payer: Self-pay

## 2021-03-25 ENCOUNTER — Encounter (HOSPITAL_BASED_OUTPATIENT_CLINIC_OR_DEPARTMENT_OTHER): Payer: BC Managed Care – PPO | Attending: Internal Medicine | Admitting: Internal Medicine

## 2021-03-25 DIAGNOSIS — L89893 Pressure ulcer of other site, stage 3: Secondary | ICD-10-CM | POA: Insufficient documentation

## 2021-03-25 DIAGNOSIS — L97522 Non-pressure chronic ulcer of other 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: Secondary | ICD-10-CM | POA: Diagnosis not present

## 2021-03-25 DIAGNOSIS — I10 Essential (primary) hypertension: Secondary | ICD-10-CM | POA: Diagnosis not present

## 2021-03-25 DIAGNOSIS — L97512 Non-pressure chronic ulcer of other part of right foot with fat layer exposed: Secondary | ICD-10-CM | POA: Diagnosis not present

## 2021-03-25 DIAGNOSIS — L89610 Pressure ulcer of right heel, unstageable: Secondary | ICD-10-CM | POA: Diagnosis not present

## 2021-03-25 DIAGNOSIS — L89620 Pressure ulcer of left heel, unstageable: Secondary | ICD-10-CM | POA: Diagnosis not present

## 2021-03-25 NOTE — Progress Notes (Signed)
WILTON, THRALL ([id number]) Visit Report for 03/25/2021 HPI Details Patient Name: Date of Service: [name], [place] 03/25/2021 9:45 A M Medical Record Number: [id number] Patient Account Number: [id number] Date of Birth/Sex: Treating RN: Dec 22, 1975 (45 y.o. [name] Primary Care Provider: [name] Other Clinician: Referring Provider: Treating Provider/Extender: [name] in Treatment: 36 History of Present Illness HPI Description: 07/15/2020 upon evaluation today patient presents for initial inspection here in our clinic concerning issues he has been having with the bottoms of his feet bilaterally. He states these actually occurred as wounds when he was hospitalized for 5 months secondary to Covid. He was apparently with tilting bed where he was in an upright position quite frequently and apparently this occurred in some way shape or form during that time. Fortunately there is no sign of active infection at this time. No fevers, chills, nausea, vomiting, or diarrhea. With that being said he still has substantial wounds on the plantar aspects of his feet Theragen require quite a bit of work to get these to heal. He has been using Santyl currently though that is been problematic both in receiving the medication as well as actually paid for it as it is become quite expensive. Prior to the experience with Covid the patient really did not have any major medical problems other than hypertension he does have some mild generalized weakness following the Covid experience. 07/22/2020 on evaluation today patient appears to be doing okay in regard to his foot ulcers I feel like the wound beds are showing signs of better improvement that I do believe the Iodoflex is helping in this regard. With that being said he does have a lot of drainage currently and this is somewhat blue/green in nature which is consistent with Pseudomonas. I do think a culture today would be  appropriate for [place] to evaluate and see if that is indeed the case I would likely start him on antibiotic orally as well he is not allergic to Cipro knows of no issues he has had in the past 12/21; patient was admitted to the clinic earlier this month with bilateral presumed pressure ulcers on the bottom of his feet apparently related to excessive pressure from a tilt table arrangement in the intensive care unit. Patient relates this to being on ECMO but I am not really sure that is exactly related to that. I must say I have never seen anything like this. He has fairly extensive full-thickness wounds extending from his heel towards his midfoot mostly centered laterally. There is already been some healing distally. He does not appear to have an arterial issue. He has been using gentamicin to the wound surfaces with Iodoflex to help with ongoing debridement 1/6; this is a patient with pressure ulcers on the bottom of his feet related to excessive pressure from a standing position in the intensive care unit. He is complaining of a lot of pain in the right heel. He is not a diabetic. 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 We have been using Iodoflex to help prepare the surfaces of both wounds for an advanced treatment product. He is nonambulatory spending most of his time in a wheelchair I have asked him not to propel the wheelchair with his heels 1/13; in general his wounds look better not much surface area change we have been using Iodoflex as of last week. I did an x-ray of the right heel as the patient was complaining of pain. I  had some thoughts about a stress fracture perhaps Achilles tendon problems however what it showed was erosive changes along the inferior aspect of the calcaneus he now has a MRI booked for 1/20. 1/20; in general his wounds continue to be better. Some improvement in the large narrow areas proximally in his foot. He is still complaining of pain in the  right heel and tenderness in certain areas of this wound. His MRI is tonight. I am not just looking for osteomyelitis that was brought up on the x-ray I am wondering about stress fractures, tendon ruptures etc. He has no such findings on the left. Also noteworthy is that the patient had critical illness neuropathy and some of the discomfort may be actual improvement in nerve function I am just not sure. These wounds were initially in the setting of severe critical illness related to COVID-19. He was put in a standing position. He may have also been on pressors at the point contributing to tissue ischemia. By his description at some point these wounds were grossly necrotic extending proximally up into the Achilles part of his heel. I do not know that I have ever really seen pictures of them like this although they may exist in epic We have ordered Tri layer Oasis. I am trying to stimulate some granulation in these areas. This is of course assuming the MRI is negative for infection 1/27; since the patient was last here he saw Dr. Juleen China of infectious disease. He is planned for vancomycin and ceftriaxone. Prior operative culture grew MSSA. Also ordered baseline lab work. He also ordered arterial studies although the ABIs in our clinic were normal as well as his clinical exam these were normal I do not think he needs to see vascular surgery. His ABIs at the PTA were 1.22 in the right triphasic waveforms with a normal TBI of 1.15 on the left ABI of 1.22 with triphasic waveforms and a normal TBI of 1.08. Finally he saw Dr. Amalia Hailey who will follow him in for 2 months. At this point I do not think he felt that he needed a procedure on the right calcaneal bone. Dr. Juleen China is elected for broad-spectrum antibiotic The patient is still having pain in the right heel. He walks with a walker 2/3; wounds are generally smaller. He is tolerating his IV antibiotics. I believe this is vancomycin and ceftriaxone. We are  still waiting for Oasis burn in terms of his out-of-pocket max which he should be meeting soon given the IV antibiotics, MRIs etc. I have asked him to check in on this. We are using silver collagen in the meantime the wounds look better 2/10; tolerating IV vancomycin and Rocephin. We are waiting to apply for Oasis. Although I am not really sure where he is in his out-of-pocket max. 2/17 started the first application of Oasis trilayer. Still on antibiotics. The wounds have generally look better. The area on the left has a little more surface slough requiring debridement 8/91; second application of Oasis trilayer. The wound surface granulation is generally look better. The area on the left with undermining laterally I think is come in a bit. 10/08/2020 upon evaluation today patient is here today for [employer] application #3. Fortunately he seems to be doing extremely well with regard to this and we are seeing a lot of new epithelial growth which is great news. Fortunately there is no signs of active infection at this time. 10/16/2020 upon evaluation today patient appears to be doing well with regard  to his foot ulcers. Do believe the Oasis has been of benefit for him. I do not see any signs of infection right now which is great news and I think that he has a lot of new epithelial growth which is great to see as well. The patient is very pleased to hear all of this. I do think we can proceed with the Oasis trilayer #4 today. 3/18; not as much improvement in these areas on his heels that I was hoping. I did reapply trilateral Oasis today the tissue looks healthier but not as much fill in as I was hoping. 3/25; better looking today I think this is come in a bit the tissue looks healthier. Triple layer Oasis reapplied #6 4/1; somewhat better looking definitely better looking surface not as much change in surface area as I was hoping. He may be spending more time [place] on days then he needs to although  he does have heel offloading boots. Triple layer Oasis reapplied #7 4/7; unfortunately apparently [facility] will not approve any further Oasis which is unfortunate since the patient did respond nicely both in terms of the condition of the wound bed as well as surface area. There is still some drainage coming from the wound but not a lot there does not appear to be any infection 4/15; we have been using Hydrofera Blue. He continues to have nice rims of epithelialization on the right greater than the left. The left the epithelialization is coming from the tip of his heel. There is moderate drainage. In this that concerns me about a total contact cast. There is no evidence of infection 4/29; patient has been using Hydrofera Blue with dressing changes. He has no complaints or issues today. 5/5; using Hydrofera Blue. I actually think that he looks marginally better than the last time I saw this 3 weeks ago. There are rims of epithelialization on the left thumb coming from the medial side on the right. Using Hydrofera Blue 5/12; using Hydrofera Blue. These continue to make improvements in surface area. His drainage was not listed as severe I therefore went ahead and put a cast on the left foot. Right foot we will continue to dress his previous 5/16; back for first total contact cast change. He did not tolerate this particularly well cast injury on the anterior tibia among other issues. Difficulty sleeping. I talked him about this in some detail and afterwards is elected to continue. I told him I would like to have a cast on for 3 weeks to see if this is going to help at all. I think he agreed 5/19; I think the wound is better. There is no tunneling towards his midfoot. The undermining medially also looks better. He has a rim of new skin distally. I think we are making progress here. The area on the left also continues to look somewhat better to me using Hydrofera Blue. He has a list of  complaints about the cast but none of them seem serious 5/26; patient presents for 1 week follow-up. He has been using a total contact cast and tolerating this well. Hydrofera Blue is the main dressing used. He denies signs of infection. 6/2 Hydrofera Blue total contact cast on the left. These were large ulcers that formed in intensive care unit where the patient was recovering from [place]. May have had something to do with being ventilated in an upright positiono Pressors etc. We have been able to get the areas down considerably and a  viable surface. There is some epithelialization in both sides. Note made of drainage 6/9; changed to [facility] last time because of drainage. He arrives with better looking surfaces and dimensions on the left than the right. Paradoxically the right actually probes more towards his midfoot the left is largely close down but both of these look improved. Using a total contact cast on the left 6/16; complex wounds on his bilateral plantar heels which were initially pressure injury from a stay in the ICU with COVID. We have been using silver alginate most recently. His dimensions of come in quite dramatically however not recently. We have been putting the left foot in a total contact cast 6/23; complex wounds on the bilateral plantar heels. I been putting the left in the cast paradoxically the area on the right is the one that is going towards closure at a faster rate. Quite a bit of drainage on the left. The patient went to see Dr. Amalia Hailey who said he was going to standby for skin grafts. I had actually considered sending him for skin grafts however he would be mandatorily off his feet for a period of weeks to months. I am thinking that the area on the right is going to close on its own the area on the left has been more stubborn even though we have him in a total contact cast 6/30; took him out of a total contact cast last week is the right heel seem to be making better  progress than the left where I was placing the cast. We are using silver alginate. Both wounds are smaller right greater than left 7/12; both wounds look as though they are making some progress. We are using silver alginate. Heel offloading boots 7/26; very gradual progress especially on the right. Using silver alginate. He is wearing heel offloading boots 8/18; he continues to close these wounds down very gradually. Using silver alginate. The problem polymen being definitive about this is areas of what appears to be callus around the margins. This is not a 100% of the area but certainly sizable especially on the right Electronic Signature(s) Signed: 03/25/2021 4:52:40 PM By: Linton Ham MD Entered By: Linton Ham on 03/25/2021 10:42:42 -------------------------------------------------------------------------------- Physical Exam Details Patient Name: Date of Service: [name], [place] 03/25/2021 9:45 A M Medical Record Number: [id number] Patient Account Number: [id number] Date of Birth/Sex: Treating RN: April 24, 1976 (45 y.o. [name] Primary Care Provider: [name] Other Clinician: Referring Provider: Treating Provider/Extender: [name] in Treatment: 36 Cardiovascular Pedal pulses palpable. Integumentary (Hair, Skin) no erythema around wounds. Notes Wound exam; bilateral plantar heels. The receding and surface area. The granulation that I can see looks healthy. The real debate here is whether to attempt to remove some of the thick callus around the edges. Normally I would do this without a second thought however he appears to be receding in terms of the amount of wound that is open therefore for now I am simply going to follow this for perhaps the next 2 to 3 weeks he is offloading this and offloading heel boots. There is no evidence of infection or contributing ischemia Electronic Signature(s) Signed: 03/25/2021 4:52:40 PM By: Linton Ham  MD Entered By: Linton Ham on 03/25/2021 10:45:51 -------------------------------------------------------------------------------- Physician Orders Details Patient Name: Date of Service: [name], [place] 03/25/2021 9:45 A M Medical Record Number: [id number] Patient Account Number: [id number] Date of Birth/Sex: Treating RN: [birth year] (45 y.o. [name] Primary Care Provider:  [name] Other Clinician: Referring Provider: Treating Provider/Extender: [name] in Treatment: 48 Verbal / Phone Orders: No Diagnosis Coding ICD-10 Coding Code Description L97.512 Non-pressure chronic ulcer of other part of right foot with fat layer exposed L89.893 Pressure ulcer of other site, stage 3 L97.522 Non-pressure chronic ulcer of other 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 Follow-up Appointments ppointment in 2 weeks. - Dr.Brunella Wileman Return A Bathing/ Shower/ Hygiene May shower with protection but do not get wound dressing(s) wet. Edema Control - Lymphedema / SCD / Other Bilateral Lower Extremities Elevate legs to the level of the heart or above for 30 minutes daily and/or when sitting, a frequency of: Avoid standing for long periods of time. Moisturize legs daily. - right leg and foot every night. Off-Loading Wedge shoe to: - Glophed Shoe to both feet. Other: - keep pressure off of the bottom of your feet Additional Orders / Instructions Follow Nutritious Diet Wound Treatment Wound #1 - Calcaneus Wound Laterality: Right Cleanser: Normal Saline (Generic) Every Other Day/30 Days Discharge Instructions: Cleanse the wound with Normal Saline prior to applying a clean dressing using gauze sponges, not tissue or cotton balls. Cleanser: Wound Cleanser Every Other Day/30 Days Discharge Instructions: Cleanse the wound with wound cleanser prior to applying a clean dressing using gauze sponges, not tissue or cotton balls. Prim Dressing: KerraCel Ag Gelling Fiber  Dressing, 2x2 in (silver alginate) (DME) (Generic) Every Other Day/30 Days ary Discharge Instructions: ****Ensure to apply into divot area of wound.**** Apply silver alginate to wound bed as instructed Secondary Dressing: Zetuvit Plus 4x8 in (DME) (Generic) Every Other Day/30 Days Discharge Instructions: Apply over primary dressing as directed. Secured With: Elastic Bandage 4 inch (ACE bandage) Every Other Day/30 Days Discharge Instructions: Secure with ACE bandage as directed. Secured With: [employer], 4.5x3.1 (in/yd) (DME) (Generic) Every Other Day/30 Days Discharge Instructions: Secure with Kerlix as directed. Wound #2 - Calcaneus Wound Laterality: Left Cleanser: Normal Saline (Generic) Every Other Day/30 Days Discharge Instructions: Cleanse the wound with Normal Saline prior to applying a clean dressing using gauze sponges, not tissue or cotton balls. Cleanser: Wound Cleanser Every Other Day/30 Days Discharge Instructions: Cleanse the wound with wound cleanser prior to applying a clean dressing using gauze sponges, not tissue or cotton balls. Prim Dressing: KerraCel Ag Gelling Fiber Dressing, 2x2 in (silver alginate) (DME) (Generic) Every Other Day/30 Days ary Discharge Instructions: ****Ensure to apply into divot area of wound.**** Apply silver alginate to wound bed as instructed Secondary Dressing: Zetuvit Plus 4x8 in (DME) (Generic) Every Other Day/30 Days Discharge Instructions: Apply over primary dressing as directed. Secured With: Elastic Bandage 4 inch (ACE bandage) Every Other Day/30 Days Discharge Instructions: Secure with ACE bandage as directed. Secured With: [employer], 4.5x3.1 (in/yd) (DME) (Generic) Every Other Day/30 Days Discharge Instructions: Secure with Kerlix as directed. Electronic Signature(s) Signed: 03/25/2021 4:52:40 PM By: Linton Ham MD Signed: 03/25/2021 6:11:30 PM By: [name] Entered By: [name] on 03/25/2021  10:35:45 -------------------------------------------------------------------------------- Problem List Details Patient Name: Date of Service: [name], [place] 03/25/2021 9:45 A M Medical Record Number: [id number] Patient Account Number: [id number] Date of Birth/Sex: Treating RN: 30-Jan-1976 (45 y.o. [name] Primary Care Provider: [name] Other Clinician: Referring Provider: Treating Provider/Extender: [name] in Treatment: 36 Active Problems ICD-10 Encounter Code Description Active Date MDM Diagnosis [id number] Non-pressure chronic ulcer of other part of right foot with fat layer exposed 09/03/2020 No Yes L89.893 Pressure  ulcer of other site, stage 3 07/15/2020 No Yes L97.522 Non-pressure chronic ulcer of other 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 09/03/2020 No Yes Inactive Problems ICD-10 Code Description Active Date Inactive Date M62.81 Muscle weakness (generalized) 07/15/2020 07/15/2020 I10 Essential (primary) hypertension 07/15/2020 07/15/2020 M86.171 Other acute osteomyelitis, right ankle and foot 09/03/2020 09/03/2020 Resolved Problems Electronic Signature(s) Signed: 03/25/2021 4:52:40 PM By: Linton Ham MD Entered By: Linton Ham on 03/25/2021 10:40:02 -------------------------------------------------------------------------------- Progress Note Details Patient Name: Date of Service: [name], [place] 03/25/2021 9:45 A M Medical Record Number: [id number] Patient Account Number: [id number] Date of Birth/Sex: Treating RN: Oct 26, 1975 (45 y.o. [name] Primary Care Provider: [name] Other Clinician: Referring Provider: Treating Provider/Extender: [name] in Treatment: 36 Subjective History of Present Illness (HPI) 07/15/2020 upon evaluation today patient presents for initial inspection here in our clinic concerning issues he has been having with the bottoms of his feet bilaterally. He states  these actually occurred as wounds when he was hospitalized for 5 months secondary to Covid. He was apparently with tilting bed where he was in an upright position quite frequently and apparently this occurred in some way shape or form during that time. Fortunately there is no sign of active infection at this time. No fevers, chills, nausea, vomiting, or diarrhea. With that being said he still has substantial wounds on the plantar aspects of his feet Theragen require quite a bit of work to get these to heal. He has been using Santyl currently though that is been problematic both in receiving the medication as well as actually paid for it as it is become quite expensive. Prior to the experience with Covid the patient really did not have any major medical problems other than hypertension he does have some mild generalized weakness following the Covid experience. 07/22/2020 on evaluation today patient appears to be doing okay in regard to his foot ulcers I feel like the wound beds are showing signs of better improvement that I do believe the Iodoflex is helping in this regard. With that being said he does have a lot of drainage currently and this is somewhat blue/green in nature which is consistent with Pseudomonas. I do think a culture today would be appropriate for [place] to evaluate and see if that is indeed the case I would likely start him on antibiotic orally as well he is not allergic to Cipro knows of no issues he has had in the past 12/21; patient was admitted to the clinic earlier this month with bilateral presumed pressure ulcers on the bottom of his feet apparently related to excessive pressure from a tilt table arrangement in the intensive care unit. Patient relates this to being on ECMO but I am not really sure that is exactly related to that. I must say I have never seen anything like this. He has fairly extensive full-thickness wounds extending from his heel towards his midfoot mostly  centered laterally. There is already been some healing distally. He does not appear to have an arterial issue. He has been using gentamicin to the wound surfaces with Iodoflex to help with ongoing debridement 1/6; this is a patient with pressure ulcers on the bottom of his feet related to excessive pressure from a standing position in the intensive care unit. He is complaining of a lot of pain in the right heel. He is not a diabetic. 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 We have been using Iodoflex to help  prepare the surfaces of both wounds for an advanced treatment product. He is nonambulatory spending most of his time in a wheelchair I have asked him not to propel the wheelchair with his heels 1/13; in general his wounds look better not much surface area change we have been using Iodoflex as of last week. I did an x-ray of the right heel as the patient was complaining of pain. I had some thoughts about a stress fracture perhaps Achilles tendon problems however what it showed was erosive changes along the inferior aspect of the calcaneus he now has a MRI booked for 1/20. 1/20; in general his wounds continue to be better. Some improvement in the large narrow areas proximally in his foot. He is still complaining of pain in the right heel and tenderness in certain areas of this wound. His MRI is tonight. I am not just looking for osteomyelitis that was brought up on the x-ray I am wondering about stress fractures, tendon ruptures etc. He has no such findings on the left. Also noteworthy is that the patient had critical illness neuropathy and some of the discomfort may be actual improvement in nerve function I am just not sure. These wounds were initially in the setting of severe critical illness related to COVID-19. He was put in a standing position. He may have also been on pressors at the point contributing to tissue ischemia. By his description at some point these wounds  were grossly necrotic extending proximally up into the Achilles part of his heel. I do not know that I have ever really seen pictures of them like this although they may exist in epic We have ordered Tri layer Oasis. I am trying to stimulate some granulation in these areas. This is of course assuming the MRI is negative for infection 1/27; since the patient was last here he saw Dr. Juleen China of infectious disease. He is planned for vancomycin and ceftriaxone. Prior operative culture grew MSSA. Also ordered baseline lab work. He also ordered arterial studies although the ABIs in our clinic were normal as well as his clinical exam these were normal I do not think he needs to see vascular surgery. His ABIs at the PTA were 1.22 in the right triphasic waveforms with a normal TBI of 1.15 on the left ABI of 1.22 with triphasic waveforms and a normal TBI of 1.08. Finally he saw Dr. Amalia Hailey who will follow him in for 2 months. At this point I do not think he felt that he needed a procedure on the right calcaneal bone. Dr. Juleen China is elected for broad-spectrum antibiotic The patient is still having pain in the right heel. He walks with a walker 2/3; wounds are generally smaller. He is tolerating his IV antibiotics. I believe this is vancomycin and ceftriaxone. We are still waiting for Oasis burn in terms of his out-of-pocket max which he should be meeting soon given the IV antibiotics, MRIs etc. I have asked him to check in on this. We are using silver collagen in the meantime the wounds look better 2/10; tolerating IV vancomycin and Rocephin. We are waiting to apply for Oasis. Although I am not really sure where he is in his out-of-pocket max. 2/17 started the first application of Oasis trilayer. Still on antibiotics. The wounds have generally look better. The area on the left has a little more surface slough requiring debridement 2/50; second application of Oasis trilayer. The wound surface granulation is  generally look better. The area on the left with  undermining laterally I think is come in a bit. 10/08/2020 upon evaluation today patient is here today for [employer] application #3. Fortunately he seems to be doing extremely well with regard to this and we are seeing a lot of new epithelial growth which is great news. Fortunately there is no signs of active infection at this time. 10/16/2020 upon evaluation today patient appears to be doing well with regard to his foot ulcers. Do believe the Oasis has been of benefit for him. I do not see any signs of infection right now which is great news and I think that he has a lot of new epithelial growth which is great to see as well. The patient is very pleased to hear all of this. I do think we can proceed with the Oasis trilayer #4 today. 3/18; not as much improvement in these areas on his heels that I was hoping. I did reapply trilateral Oasis today the tissue looks healthier but not as much fill in as I was hoping. 3/25; better looking today I think this is come in a bit the tissue looks healthier. Triple layer Oasis reapplied #6 4/1; somewhat better looking definitely better looking surface not as much change in surface area as I was hoping. He may be spending more time [place] on days then he needs to although he does have heel offloading boots. Triple layer Oasis reapplied #7 4/7; unfortunately apparently [facility] will not approve any further Oasis which is unfortunate since the patient did respond nicely both in terms of the condition of the wound bed as well as surface area. There is still some drainage coming from the wound but not a lot there does not appear to be any infection 4/15; we have been using Hydrofera Blue. He continues to have nice rims of epithelialization on the right greater than the left. The left the epithelialization is coming from the tip of his heel. There is moderate drainage. In this that concerns me about a  total contact cast. There is no evidence of infection 4/29; patient has been using Hydrofera Blue with dressing changes. He has no complaints or issues today. 5/5; using Hydrofera Blue. I actually think that he looks marginally better than the last time I saw this 3 weeks ago. There are rims of epithelialization on the left thumb coming from the medial side on the right. Using Hydrofera Blue 5/12; using Hydrofera Blue. These continue to make improvements in surface area. His drainage was not listed as severe I therefore went ahead and put a cast on the left foot. Right foot we will continue to dress his previous 5/16; back for first total contact cast change. He did not tolerate this particularly well cast injury on the anterior tibia among other issues. Difficulty sleeping. I talked him about this in some detail and afterwards is elected to continue. I told him I would like to have a cast on for 3 weeks to see if this is going to help at all. I think he agreed 5/19; I think the wound is better. There is no tunneling towards his midfoot. The undermining medially also looks better. He has a rim of new skin distally. I think we are making progress here. The area on the left also continues to look somewhat better to me using Hydrofera Blue. He has a list of complaints about the cast but none of them seem serious 5/26; patient presents for 1 week follow-up. He has been using a total contact  cast and tolerating this well. Hydrofera Blue is the main dressing used. He denies signs of infection. 6/2 Hydrofera Blue total contact cast on the left. These were large ulcers that formed in intensive care unit where the patient was recovering from [facility]. May have had something to do with being ventilated in an upright positiono Pressors etc. We have been able to get the areas down considerably and a viable surface. There is some epithelialization in both sides. Note made of drainage 6/9; changed to [facility]  last time because of drainage. He arrives with better looking surfaces and dimensions on the left than the right. Paradoxically the right actually probes more towards his midfoot the left is largely close down but both of these look improved. Using a total contact cast on the left 6/16; complex wounds on his bilateral plantar heels which were initially pressure injury from a stay in the ICU with COVID. We have been using silver alginate most recently. His dimensions of come in quite dramatically however not recently. We have been putting the left foot in a total contact cast 6/23; complex wounds on the bilateral plantar heels. I been putting the left in the cast paradoxically the area on the right is the one that is going towards closure at a faster rate. Quite a bit of drainage on the left. The patient went to see Dr. Amalia Hailey who said he was going to standby for skin grafts. I had actually considered sending him for skin grafts however he would be mandatorily off his feet for a period of weeks to months. I am thinking that the area on the right is going to close on its own the area on the left has been more stubborn even though we have him in a total contact cast 6/30; took him out of a total contact cast last week is the right heel seem to be making better progress than the left where I was placing the cast. We are using silver alginate. Both wounds are smaller right greater than left 7/12; both wounds look as though they are making some progress. We are using silver alginate. Heel offloading boots 7/26; very gradual progress especially on the right. Using silver alginate. He is wearing heel offloading boots 8/18; he continues to close these wounds down very gradually. Using silver alginate. The problem polymen being definitive about this is areas of what appears to be callus around the margins. This is not a 100% of the area but certainly sizable especially on the  right Objective Constitutional Vitals Time Taken: 9:58 AM, Height: 69 in, Weight: 280 lbs, BMI: 41.3, Temperature: 98.4 F, Pulse: 90 bpm, Respiratory Rate: 20 breaths/min, Blood Pressure: 134/84 mmHg. Cardiovascular Pedal pulses palpable. General Notes: Wound exam; bilateral plantar heels. The receding and surface area. The granulation that I can see looks healthy. The real debate here is whether to attempt to remove some of the thick callus around the edges. Normally I would do this without a second thought however he appears to be receding in terms of the amount of wound that is open therefore for now I am simply going to follow this for perhaps the next 2 to 3 weeks he is offloading this and offloading heel boots. There is no evidence of infection or contributing ischemia Integumentary (Hair, Skin) no erythema around wounds. Wound #1 status is Open. Original cause of wound was Pressure Injury. The date acquired was: 10/07/2019. The wound has been in treatment 36 weeks. The wound is located on  the Right Calcaneus. The wound measures 2.2cm length x 1cm width x 0.3cm depth; 1.728cm^2 area and 0.518cm^3 volume. There is Fat Layer (Subcutaneous Tissue) exposed. There is no tunneling or undermining noted. There is a medium amount of serosanguineous drainage noted. The wound margin is distinct with the outline attached to the wound base. There is large (67-100%) red, pink granulation within the wound bed. There is no necrotic tissue within the wound bed. General Notes: callous noted to periwound. Wound #2 status is Open. Original cause of wound was Pressure Injury. The date acquired was: 10/07/2019. The wound has been in treatment 36 weeks. The wound is located on the Left Calcaneus. The wound measures 4cm length x 1cm width x 0.3cm depth; 3.142cm^2 area and 0.942cm^3 volume. There is Fat Layer (Subcutaneous Tissue) exposed. There is a medium amount of serosanguineous drainage noted. The wound margin  is thickened. There is large (67-100%) red, pale granulation within the wound bed. There is no necrotic tissue within the wound bed. General Notes: callous noted to periwound. Assessment Active Problems ICD-10 Non-pressure chronic ulcer of other part of right foot with fat layer exposed Pressure ulcer of other site, stage 3 Non-pressure chronic ulcer of other 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 Plan Follow-up Appointments: Return Appointment in 2 weeks. - Dr.Mileidy Atkin Bathing/ Shower/ Hygiene: May shower with protection but do not get wound dressing(s) wet. Edema Control - Lymphedema / SCD / Other: Elevate legs to the level of the heart or above for 30 minutes daily and/or when sitting, a frequency of: Avoid standing for long periods of time. Moisturize legs daily. - right leg and foot every night. Off-Loading: Wedge shoe to: - Glophed Shoe to both feet. Other: - keep pressure off of the bottom of your feet Additional Orders / Instructions: Follow Nutritious Diet WOUND #1: - Calcaneus Wound Laterality: Right Cleanser: Normal Saline (Generic) Every Other Day/30 Days Discharge Instructions: Cleanse the wound with Normal Saline prior to applying a clean dressing using gauze sponges, not tissue or cotton balls. Cleanser: Wound Cleanser Every Other Day/30 Days Discharge Instructions: Cleanse the wound with wound cleanser prior to applying a clean dressing using gauze sponges, not tissue or cotton balls. Prim Dressing: KerraCel Ag Gelling Fiber Dressing, 2x2 in (silver alginate) (DME) (Generic) Every Other Day/30 Days ary Discharge Instructions: ****Ensure to apply into divot area of wound.**** Apply silver alginate to wound bed as instructed Secondary Dressing: Zetuvit Plus 4x8 in (DME) (Generic) Every Other Day/30 Days Discharge Instructions: Apply over primary dressing as directed. Secured With: Elastic Bandage 4 inch (ACE bandage) Every Other Day/30 Days Discharge Instructions:  Secure with ACE bandage as directed. Secured With: [employer], 4.5x3.1 (in/yd) (DME) (Generic) Every Other Day/30 Days Discharge Instructions: Secure with Kerlix as directed. WOUND #2: - Calcaneus Wound Laterality: Left Cleanser: Normal Saline (Generic) Every Other Day/30 Days Discharge Instructions: Cleanse the wound with Normal Saline prior to applying a clean dressing using gauze sponges, not tissue or cotton balls. Cleanser: Wound Cleanser Every Other Day/30 Days Discharge Instructions: Cleanse the wound with wound cleanser prior to applying a clean dressing using gauze sponges, not tissue or cotton balls. Prim Dressing: KerraCel Ag Gelling Fiber Dressing, 2x2 in (silver alginate) (DME) (Generic) Every Other Day/30 Days ary Discharge Instructions: ****Ensure to apply into divot area of wound.**** Apply silver alginate to wound bed as instructed Secondary Dressing: Zetuvit Plus 4x8 in (DME) (Generic) Every Other Day/30 Days Discharge Instructions: Apply over primary dressing as directed. Secured With: Patent attorney  Bandage 4 inch (ACE bandage) Every Other Day/30 Days Discharge Instructions: Secure with ACE bandage as directed. Secured With: [employer], 4.5x3.1 (in/yd) (DME) (Generic) Every Other Day/30 Days Discharge Instructions: Secure with Kerlix as directed. 1. I am continuing with the silver alginate as the patient appears to be making progress 2. Backings Zetuvit and Ace wrap's 3. Offloading heel boots 4. I see no evidence of infection here 5. After some thought I elected not to attempt to debride the thickened areas around the circumference especially on the right however I may elect to do this especially if the wound areas stalls Electronic Signature(s) Signed: 03/25/2021 4:52:40 PM By: Linton Ham MD Entered By: Linton Ham on 03/25/2021 10:47:07 -------------------------------------------------------------------------------- SuperBill Details Patient  Name: Date of Service: [name], [place]. 03/25/2021 Medical Record Number: [id number] Patient Account Number: [id number] Date of Birth/Sex: Treating RN: 1975-12-24 (45 y.o. [name], [name] Primary Care Provider: [name] Other Clinician: Referring Provider: Treating Provider/Extender: [name] in Treatment: 36 Diagnosis Coding ICD-10 Codes Code Description [phone] Non-pressure chronic ulcer of other part of right foot with fat layer exposed L89.893 Pressure ulcer of other site, stage 3 L97.522 Non-pressure chronic ulcer of other 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 Facility Procedures CPT4 Code: [id number] Description: 99214 - WOUND CARE VISIT-LEV 4 EST PT Modifier: Quantity: 1 Physician Procedures : CPT4 Code Description Modifier [id number] - WC PHYS LEVEL 3 - EST PT ICD-10 Diagnosis Description L97.512 Non-pressure chronic ulcer of other part of right foot with fat layer exposed L97.522 Non-pressure chronic ulcer of other part of left foot  with fat layer exposed L89.893 Pressure ulcer of other site, stage 3 Quantity: 1 Electronic Signature(s) Signed: 03/25/2021 4:52:40 PM By: Linton Ham MD Entered By: Linton Ham on 03/25/2021 10:47:26

## 2021-03-26 ENCOUNTER — Other Ambulatory Visit: Payer: Self-pay | Admitting: Physical Medicine and Rehabilitation

## 2021-03-26 DIAGNOSIS — S91309A Unspecified open wound, unspecified foot, initial encounter: Secondary | ICD-10-CM | POA: Diagnosis not present

## 2021-03-31 NOTE — Progress Notes (Signed)
DEVAUNTE, GASPARINI ([id number]) Visit Report for 03/25/2021 Arrival Information Details Patient Name: Date of Service: [name], Register 03/25/2021 9:45 A M Medical Record Number: [id number] Patient Account Number: [id number] Date of Birth/Sex: Treating RN: 11/15/1975 (45 y.o. [name], [name] Primary Care [name]: [name] Other Clinician: Referring [name]: Treating [name]/Extender: [name] in Treatment: 38 Visit Information History Since Last Visit Added or deleted any medications: No Patient Arrived: Wheel Chair Any new allergies or adverse reactions: No Arrival Time: 09:56 Had a fall or experienced change in No Accompanied By: family activities of daily living that may affect Transfer Assistance: None risk of falls: Patient Identification Verified: Yes Signs or symptoms of abuse/neglect since last visito No Secondary Verification Process Completed: Yes Hospitalized since last visit: No Patient Requires Transmission-Based Precautions: No Implantable device outside of the clinic excluding No Patient Has Alerts: No cellular tissue based products placed in the center since last visit: Has Dressing in Place as Prescribed: Yes Pain Present Now: No Electronic Signature(s) Signed: 03/31/2021 9:12:52 AM By: Sandre Kitty Entered By: Sandre Kitty on 03/25/2021 09:56:47 -------------------------------------------------------------------------------- Clinic Level of Care Assessment Details Patient Name: Date of Service: [name] [place]. 03/25/2021 9:45 A M Medical Record Number: [id number] Patient Account Number: [id number] Date of Birth/Sex: Treating RN: Aug 03, 1976 (45 y.o. [name], [name] Primary Care Ayanna Gheen: [name] Other Clinician: Referring [name]: Treating [name]/Extender: [name] in Treatment: 36 Clinic Level of Care Assessment Items TOOL 4 Quantity Score X- 1 0 Use when only an EandM is performed  on FOLLOW-UP visit ASSESSMENTS - Nursing Assessment / Reassessment []  - 0 Reassessment of Co-morbidities (includes updates in patient status) []  - 0 Reassessment of Adherence to Treatment Plan ASSESSMENTS - Wound and Skin A ssessment / Reassessment []  - 0 Simple Wound Assessment / Reassessment - one wound X- 2 5 Complex Wound Assessment / Reassessment - multiple wounds X- 1 10 Dermatologic / Skin Assessment (not related to wound area) ASSESSMENTS - Focused Assessment X- 2 5 Circumferential Edema Measurements - multi extremities X- 1 10 Nutritional Assessment / Counseling / Intervention []  - 0 Lower Extremity Assessment (monofilament, tuning fork, pulses) []  - 0 Peripheral Arterial Disease Assessment (using hand held doppler) ASSESSMENTS - Ostomy and/or Continence Assessment and Care []  - 0 Incontinence Assessment and Management []  - 0 Ostomy Care Assessment and Management (repouching, etc.) PROCESS - Coordination of Care []  - 0 Simple Patient / Family Education for ongoing care X- 1 20 Complex (extensive) Patient / Family Education for ongoing care X- 1 10 Staff obtains Programmer, systems, Records, T Results / Process Orders est []  - 0 Staff telephones HHA, Nursing Homes / Clarify orders / etc []  - 0 Routine Transfer to another Facility (non-emergent condition) []  - 0 Routine Hospital Admission (non-emergent condition) []  - 0 New Admissions / Biomedical engineer / Ordering NPWT Apligraf, etc. , []  - 0 Emergency Hospital Admission (emergent condition) []  - 0 Simple Discharge Coordination X- 1 15 Complex (extensive) Discharge Coordination PROCESS - Special Needs []  - 0 Pediatric / Minor Patient Management []  - 0 Isolation Patient Management []  - 0 Hearing / Language / Visual special needs []  - 0 Assessment of Community assistance (transportation, D/C planning, etc.) []  - 0 Additional assistance / Altered mentation []  - 0 Support Surface(s) Assessment (bed,  cushion, seat, etc.) INTERVENTIONS - Wound Cleansing / Measurement []  - 0 Simple Wound Cleansing - one wound X- 2 5 Complex Wound Cleansing - multiple wounds  X- 1 5 Wound Imaging (photographs - any number of wounds) []  - 0 Wound Tracing (instead of photographs) []  - 0 Simple Wound Measurement - one wound X- 2 5 Complex Wound Measurement - multiple wounds INTERVENTIONS - Wound Dressings []  - 0 Small Wound Dressing one or multiple wounds X- 2 15 Medium Wound Dressing one or multiple wounds []  - 0 Large Wound Dressing one or multiple wounds []  - 0 Application of Medications - topical []  - 0 Application of Medications - injection INTERVENTIONS - Miscellaneous []  - 0 External ear exam []  - 0 Specimen Collection (cultures, biopsies, blood, body fluids, etc.) []  - 0 Specimen(s) / Culture(s) sent or taken to Lab for analysis []  - 0 Patient Transfer (multiple staff / Civil Service fast streamer / Similar devices) []  - 0 Simple Staple / Suture removal (25 or less) []  - 0 Complex Staple / Suture removal (26 or more) []  - 0 Hypo / Hyperglycemic Management (close monitor of Blood Glucose) []  - 0 Ankle / Brachial Index (ABI) - do not check if billed separately X- 1 5 Vital Signs Has the patient been seen at the hospital within the last three years: Yes Total Score: 145 Level Of Care: New/Established - Level 4 Electronic Signature(s) Signed: 03/25/2021 6:11:30 PM By: [name] Entered By: [name] on 03/25/2021 10:36:24 -------------------------------------------------------------------------------- Encounter Discharge Information Details Patient Name: Date of Service: [name], [name] 03/25/2021 9:45 A M Medical Record Number: [id number] Patient Account Number: [id number] Date of Birth/Sex: Treating RN: July 28, 1976 (45 y.o. [name] Primary Care Cherylynn Liszewski: [name] Other Clinician: Referring Icarus Partch: Treating Krupa Stege/Extender: [name] in  Treatment: 36 Encounter Discharge Information Items Discharge Condition: Stable Ambulatory Status: Wheelchair Discharge Destination: Home Transportation: Private Auto Accompanied By: family Schedule Follow-up Appointment: Yes Clinical Summary of Care: Electronic Signature(s) Signed: 03/25/2021 6:11:30 PM By: [name] Entered By: [name] on 03/25/2021 10:37:39 -------------------------------------------------------------------------------- Lower Extremity Assessment Details Patient Name: Date of Service: [name], [place] 03/25/2021 9:45 A M Medical Record Number: [id number] Patient Account Number: [id number] Date of Birth/Sex: Treating RN: 1976-02-06 (45 y.o. [name] Primary Care Slayden Mennenga: [name] Other Clinician: Referring Rhonda Vangieson: Treating Donni Oglesby/Extender: [name] in Treatment: 36 Edema Assessment Assessed: [name]: Yes] [name]: Yes] Edema: [Left: No] [Right: No] Calf Left: Right: Point of Measurement: 29 cm From Medial Instep 42 cm 43.5 cm Ankle Left: Right: Point of Measurement: 9 cm From Medial Instep 24 cm 25 cm Vascular Assessment Pulses: Dorsalis Pedis Palpable: [Left:Yes] [Right:Yes] Electronic Signature(s) Signed: 03/25/2021 6:11:30 PM By: [name] Entered By: [name] on 03/25/2021 10:15:36 -------------------------------------------------------------------------------- Multi Wound Chart Details Patient Name: Date of Service: [name], [place] 03/25/2021 9:45 A M Medical Record Number: [id number] Patient Account Number: [id number] Date of Birth/Sex: Treating RN: [birth year] (45 y.o. [name], [name] Primary Care Laderrick Wilk: [name] Other Clinician: Referring Koi Yarbro: Treating Jeramiah Mccaughey/Extender: [name] in Treatment: 36 Vital Signs Height(in): 69 Pulse(bpm): 90 Weight(lbs): 280 Blood Pressure(mmHg): 134/84 Body Mass Index(BMI): 41 Temperature(F):  98.4 Respiratory Rate(breaths/min): 20 Photos: [N/A:N/A] Right Calcaneus Left Calcaneus N/A Wound Location: Pressure Injury Pressure Injury N/A Wounding Event: Pressure Ulcer Pressure Ulcer N/A Primary Etiology: Asthma, Angina, Hypertension Asthma, Angina, Hypertension N/A Comorbid History: 10/07/2019 10/07/2019 N/A Date Acquired: 36 36 N/A Weeks of Treatment: Open Open N/A Wound Status: 2.2x1x0.3 4x1x0.3 N/A Measurements L x W x D (cm) 1.728 3.142 N/A A (cm) : rea 0.518 0.942 N/A Volume (cm) :  94.50% 88.40% N/A % Reduction in A rea: 98.40% 96.50% N/A % Reduction in Volume: Category/Stage III Category/Stage III N/A Classification: Medium Medium N/A Exudate A mount: Serosanguineous Serosanguineous N/A Exudate Type: red, brown red, brown N/A Exudate Color: Distinct, outline attached Thickened N/A Wound Margin: Large (67-100%) Large (67-100%) N/A Granulation A mount: Red, Pink Red, Pale N/A Granulation Quality: None Present (0%) None Present (0%) N/A Necrotic A mount: Fat Layer (Subcutaneous Tissue): Yes Fat Layer (Subcutaneous Tissue): Yes N/A Exposed Structures: Fascia: No Fascia: No Tendon: No Tendon: No Muscle: No Muscle: No Joint: No Joint: No Bone: No Bone: No Medium (34-66%) Small (1-33%) N/A Epithelialization: callous noted to periwound. callous noted to periwound. N/A Assessment Notes: Treatment Notes Wound #1 (Calcaneus) Wound Laterality: Right Cleanser Normal Saline Discharge Instruction: Cleanse the wound with Normal Saline prior to applying a clean dressing using gauze sponges, not tissue or cotton balls. Wound Cleanser Discharge Instruction: Cleanse the wound with wound cleanser prior to applying a clean dressing using gauze sponges, not tissue or cotton balls. Peri-Wound Care Topical Primary Dressing KerraCel Ag Gelling Fiber Dressing, 2x2 in (silver alginate) Discharge Instruction: ****Ensure to apply into divot area of wound.****  Apply silver alginate to wound bed as instructed Secondary Dressing Zetuvit Plus 4x8 in Discharge Instruction: Apply over primary dressing as directed. Secured With Elastic Bandage 4 inch (ACE bandage) Discharge Instruction: Secure with ACE bandage as directed. Kerlix Roll Sterile, 4.5x3.1 (in/yd) Discharge Instruction: Secure with Kerlix as directed. Compression Wrap Compression Stockings Add-Ons Wound #2 (Calcaneus) Wound Laterality: Left Cleanser Normal Saline Discharge Instruction: Cleanse the wound with Normal Saline prior to applying a clean dressing using gauze sponges, not tissue or cotton balls. Wound Cleanser Discharge Instruction: Cleanse the wound with wound cleanser prior to applying a clean dressing using gauze sponges, not tissue or cotton balls. Peri-Wound Care Topical Primary Dressing KerraCel Ag Gelling Fiber Dressing, 2x2 in (silver alginate) Discharge Instruction: ****Ensure to apply into divot area of wound.**** Apply silver alginate to wound bed as instructed Secondary Dressing Zetuvit Plus 4x8 in Discharge Instruction: Apply over primary dressing as directed. Secured With Elastic Bandage 4 inch (ACE bandage) Discharge Instruction: Secure with ACE bandage as directed. Kerlix Roll Sterile, 4.5x3.1 (in/yd) Discharge Instruction: Secure with Kerlix as directed. Compression Wrap Compression Stockings Add-Ons Electronic Signature(s) Signed: 03/25/2021 4:52:40 PM By: Linton Ham MD Signed: 03/25/2021 6:11:30 PM By: [name] Entered By: Linton Ham on 03/25/2021 10:40:19 -------------------------------------------------------------------------------- Multi-Disciplinary Care Plan Details Patient Name: Date of Service: [name], [place] 03/25/2021 9:45 A M Medical Record Number: [id number] Patient Account Number: [id number] Date of Birth/Sex: Treating RN: April 01, 1976 (45 y.o. [name] Primary Care Corin Formisano: [name] Other  Clinician: Referring Aleshia Cartelli: Treating Trinita Devlin/Extender: [name] in Treatment: 36 Multidisciplinary Care Plan reviewed with physician Active Inactive Abuse / Safety / Falls / Self Care Management Nursing Diagnoses: Potential for falls Goals: Patient/caregiver will verbalize/demonstrate measures taken to prevent injury and/or falls Date Initiated: 07/15/2020 Target Resolution Date: 04/29/2021 Goal Status: Active Interventions: Assess fall risk on admission and as needed Assess impairment of mobility on admission and as needed per policy Notes: Electronic Signature(s) Signed: 03/25/2021 6:11:30 PM By: [name] Entered By: [name] on 03/25/2021 10:18:06 -------------------------------------------------------------------------------- Pain Assessment Details Patient Name: Date of Service: [name], [place]. 03/25/2021 9:45 A M Medical Record Number: [id number] Patient Account Number: [id number] Date of Birth/Sex: Treating RN: 12/10/1975 (45 y.o. [name] Primary Care Kinga Cassar: [name] Other Clinician: Referring Deavin Forst:  Treating Tylynn Braniff/Extender: [name] in Treatment: 36 Active Problems Location of Pain Severity and Description of Pain Patient Has Paino No Site Locations Pain Management and Medication Current Pain Management: Electronic Signature(s) Signed: 03/25/2021 6:11:30 PM By: [name] Signed: 03/31/2021 9:12:52 AM By: Sandre Kitty Entered By: Sandre Kitty on 03/25/2021 09:58:27 -------------------------------------------------------------------------------- Patient/Caregiver Education Details Patient Name: Date of Service: [name] 8/18/2022andnbsp9:45 A M Medical Record Number: [id number] Patient Account Number: [id number] Date of Birth/Gender: Treating RN: [birth year] (45 y.o. [name] Primary Care Physician: [name] Other Clinician: Referring  Physician: Treating Physician/Extender: [name] in Treatment: 36 Education Assessment Education Provided To: Patient Education Topics Provided Wound/Skin Impairment: Handouts: Skin Care Do's and Dont's Methods: Explain/Verbal Responses: Reinforcements needed Electronic Signature(s) Signed: 03/25/2021 6:11:30 PM By: [name] Entered By: [name] on 03/25/2021 10:19:18 -------------------------------------------------------------------------------- Wound Assessment Details Patient Name: Date of Service: [name], [place] 03/25/2021 9:45 A M Medical Record Number: [id number] Patient Account Number: [id number] Date of Birth/Sex: Treating RN: 1976-06-04 (45 y.o. [name], [name] Primary Care Abbie Berling: [name] Other Clinician: Referring Alva Kuenzel: Treating Yoshua Geisinger/Extender: [name] in Treatment: 36 Wound Status Wound Number: 1 Primary Etiology: Pressure Ulcer Wound Location: Right Calcaneus Wound Status: Open Wounding Event: Pressure Injury Comorbid History: Asthma, Angina, Hypertension Date Acquired: 10/07/2019 Weeks Of Treatment: 36 Clustered Wound: No Photos Wound Measurements Length: (cm) 2.2 Width: (cm) 1 Depth: (cm) 0.3 Area: (cm) 1.728 Volume: (cm) 0.518 % Reduction in Area: 94.5% % Reduction in Volume: 98.4% Epithelialization: Medium (34-66%) Tunneling: No Undermining: No Wound Description Classification: Category/Stage III Wound Margin: Distinct, outline attached Exudate Amount: Medium Exudate Type: Serosanguineous Exudate Color: red, brown Foul Odor After Cleansing: No Slough/Fibrino No Wound Bed Granulation Amount: Large (67-100%) Exposed Structure Granulation Quality: Red, Pink Fascia Exposed: No Necrotic Amount: None Present (0%) Fat Layer (Subcutaneous Tissue) Exposed: Yes Tendon Exposed: No Muscle Exposed: No Joint Exposed: No Bone Exposed: No Assessment Notes callous noted  to periwound. Treatment Notes Wound #1 (Calcaneus) Wound Laterality: Right Cleanser Normal Saline Discharge Instruction: Cleanse the wound with Normal Saline prior to applying a clean dressing using gauze sponges, not tissue or cotton balls. Wound Cleanser Discharge Instruction: Cleanse the wound with wound cleanser prior to applying a clean dressing using gauze sponges, not tissue or cotton balls. Peri-Wound Care Topical Primary Dressing KerraCel Ag Gelling Fiber Dressing, 2x2 in (silver alginate) Discharge Instruction: ****Ensure to apply into divot area of wound.**** Apply silver alginate to wound bed as instructed Secondary Dressing Zetuvit Plus 4x8 in Discharge Instruction: Apply over primary dressing as directed. Secured With Elastic Bandage 4 inch (ACE bandage) Discharge Instruction: Secure with ACE bandage as directed. Kerlix Roll Sterile, 4.5x3.1 (in/yd) Discharge Instruction: Secure with Kerlix as directed. Compression Wrap Compression Stockings Add-Ons Electronic Signature(s) Signed: 03/25/2021 6:11:30 PM By: [name] Entered By: [name] on 03/25/2021 10:17:23 -------------------------------------------------------------------------------- Wound Assessment Details Patient Name: Date of Service: [name], [place] 03/25/2021 9:45 A M Medical Record Number: [id number] Patient Account Number: [id number] Date of Birth/Sex: Treating RN: 05/28/1976 (45 y.o. [name] Primary Care Zhi Geier: [name] Other Clinician: Referring Allante Whitmire: Treating Dakiyah Heinke/Extender: [name] in Treatment: 36 Wound Status Wound Number: 2 Primary Etiology: Pressure Ulcer Wound Location: Left Calcaneus Wound Status: Open Wounding Event: Pressure Injury Comorbid History: Asthma, Angina, Hypertension Date Acquired: 10/07/2019 Weeks Of Treatment: 36 Clustered Wound: No Photos Wound Measurements Length: (cm) 4 Width: (cm) 1  Depth: (cm)  0.3 Area: (cm) 3.142 Volume: (cm) 0.942 % Reduction in Area: 88.4% % Reduction in Volume: 96.5% Epithelialization: Small (1-33%) Wound Description Classification: Category/Stage III Wound Margin: Thickened Exudate Amount: Medium Exudate Type: Serosanguineous Exudate Color: red, brown Foul Odor After Cleansing: No Slough/Fibrino No Wound Bed Granulation Amount: Large (67-100%) Exposed Structure Granulation Quality: Red, Pale Fascia Exposed: No Necrotic Amount: None Present (0%) Fat Layer (Subcutaneous Tissue) Exposed: Yes Tendon Exposed: No Muscle Exposed: No Joint Exposed: No Bone Exposed: No Assessment Notes callous noted to periwound. Treatment Notes Wound #2 (Calcaneus) Wound Laterality: Left Cleanser Normal Saline Discharge Instruction: Cleanse the wound with Normal Saline prior to applying a clean dressing using gauze sponges, not tissue or cotton balls. Wound Cleanser Discharge Instruction: Cleanse the wound with wound cleanser prior to applying a clean dressing using gauze sponges, not tissue or cotton balls. Peri-Wound Care Topical Primary Dressing KerraCel Ag Gelling Fiber Dressing, 2x2 in (silver alginate) Discharge Instruction: ****Ensure to apply into divot area of wound.**** Apply silver alginate to wound bed as instructed Secondary Dressing Zetuvit Plus 4x8 in Discharge Instruction: Apply over primary dressing as directed. Secured With Elastic Bandage 4 inch (ACE bandage) Discharge Instruction: Secure with ACE bandage as directed. Kerlix Roll Sterile, 4.5x3.1 (in/yd) Discharge Instruction: Secure with Kerlix as directed. Compression Wrap Compression Stockings Add-Ons Electronic Signature(s) Signed: 03/25/2021 6:11:30 PM By: [name] Entered By: [name] on 03/25/2021 10:17:41 -------------------------------------------------------------------------------- Vitals Details Patient Name: Date of Service: [name], [place] 03/25/2021 9:45 A  M Medical Record Number: [id number] Patient Account Number: [id number] Date of Birth/Sex: Treating RN: 13-Jul-1976 (45 y.o. [name] Primary Care Liliana Brentlinger: [name] Other Clinician: Referring Cranston Koors: Treating Willer Osorno/Extender: [name] in Treatment: 36 Vital Signs Time Taken: 09:58 Temperature (F): 98.4 Height (in): 69 Pulse (bpm): 90 Weight (lbs): 280 Respiratory Rate (breaths/min): 20 Body Mass Index (BMI): 41.3 Blood Pressure (mmHg): 134/84 Reference Range: 80 - 120 mg / dl Electronic Signature(s) Signed: 03/31/2021 9:12:52 AM By: Sandre Kitty Entered By: Sandre Kitty on 03/25/2021 09:58:21

## 2021-04-07 DIAGNOSIS — R7309 Other abnormal glucose: Secondary | ICD-10-CM | POA: Diagnosis not present

## 2021-04-07 DIAGNOSIS — Z125 Encounter for screening for malignant neoplasm of prostate: Secondary | ICD-10-CM | POA: Diagnosis not present

## 2021-04-07 DIAGNOSIS — I10 Essential (primary) hypertension: Secondary | ICD-10-CM | POA: Diagnosis not present

## 2021-04-08 ENCOUNTER — Encounter (HOSPITAL_BASED_OUTPATIENT_CLINIC_OR_DEPARTMENT_OTHER): Payer: BC Managed Care – PPO | Attending: Internal Medicine | Admitting: Internal Medicine

## 2021-04-08 ENCOUNTER — Other Ambulatory Visit: Payer: Self-pay

## 2021-04-08 DIAGNOSIS — L89610 Pressure ulcer of right heel, unstageable: Secondary | ICD-10-CM | POA: Diagnosis not present

## 2021-04-08 DIAGNOSIS — I10 Essential (primary) hypertension: Secondary | ICD-10-CM | POA: Diagnosis not present

## 2021-04-08 DIAGNOSIS — L97512 Non-pressure chronic ulcer of other part of right foot with fat layer exposed: Secondary | ICD-10-CM | POA: Diagnosis not present

## 2021-04-08 DIAGNOSIS — L89893 Pressure ulcer of other site, stage 3: Secondary | ICD-10-CM | POA: Diagnosis not present

## 2021-04-08 DIAGNOSIS — L97522 Non-pressure chronic ulcer of other 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: Secondary | ICD-10-CM | POA: Diagnosis not present

## 2021-04-08 DIAGNOSIS — L89620 Pressure ulcer of left heel, unstageable: Secondary | ICD-10-CM | POA: Diagnosis not present

## 2021-04-08 NOTE — Progress Notes (Signed)
[name] ([id number]) Visit Report for 04/08/2021 Arrival Information Details Patient Name: Date of Service: [name], [place] 04/08/2021 10:00 A M Medical Record Number: [id number] Patient Account Number: [id number] Date of Birth/Sex: Treating RN: 1975/09/08 (45 y.o. [name] Primary Care Phillip Sandler: [name] Other Clinician: Referring [name]: Treating [name]/Extender: [name] in Treatment: 38 Visit Information History Since Last Visit Added or deleted any medications: No Patient Arrived: Wheel Chair Any new allergies or adverse reactions: No Arrival Time: 09:54 Had a fall or experienced change in No Accompanied By: spouse activities of daily living that may affect Transfer Assistance: None risk of falls: Patient Identification Verified: Yes Signs or symptoms of abuse/neglect since last visito No Secondary Verification Process Completed: Yes Hospitalized since last visit: No Patient Requires Transmission-Based Precautions: No Implantable device outside of the clinic excluding No Patient Has Alerts: No cellular tissue based products placed in the center since last visit: Has Dressing in Place as Prescribed: Yes Has Footwear/Offloading in Place as Prescribed: Yes Left: Other:globoped Right: Other:globoped Pain Present Now: Yes Electronic Signature(s) Signed: 04/08/2021 5:16:40 PM By: Baruch Gouty RN, BSN Entered By: Baruch Gouty on 04/08/2021 09:54:36 -------------------------------------------------------------------------------- Clinic Level of Care Assessment Details Patient Name: Date of Service: [name] [name], [name] 04/08/2021 10:00 A M Medical Record Number: [id number] Patient Account Number: [id number] Date of Birth/Sex: Treating RN: 1976/01/19 (45 y.o. [name] Primary Care Rawson Minix: [name] Other Clinician: Referring Karessa Onorato: Treating [name]/Extender: [name] in Treatment:  38 Clinic Level of Care Assessment Items TOOL 4 Quantity Score X- 1 0 Use when only an EandM is performed on FOLLOW-UP visit ASSESSMENTS - Nursing Assessment / Reassessment X- 1 10 Reassessment of Co-morbidities (includes updates in patient status) X- 1 5 Reassessment of Adherence to Treatment Plan ASSESSMENTS - Wound and Skin A ssessment / Reassessment []  - 0 Simple Wound Assessment / Reassessment - one wound X- 2 5 Complex Wound Assessment / Reassessment - multiple wounds []  - 0 Dermatologic / Skin Assessment (not related to wound area) ASSESSMENTS - Focused Assessment []  - 0 Circumferential Edema Measurements - multi extremities []  - 0 Nutritional Assessment / Counseling / Intervention X- 1 5 Lower Extremity Assessment (monofilament, tuning fork, pulses) []  - 0 Peripheral Arterial Disease Assessment (using hand held doppler) ASSESSMENTS - Ostomy and/or Continence Assessment and Care []  - 0 Incontinence Assessment and Management []  - 0 Ostomy Care Assessment and Management (repouching, etc.) PROCESS - Coordination of Care X - Simple Patient / Family Education for ongoing care 1 15 []  - 0 Complex (extensive) Patient / Family Education for ongoing care X- 1 10 Staff obtains Programmer, systems, Records, T Results / Process Orders est []  - 0 Staff telephones HHA, Nursing Homes / Clarify orders / etc []  - 0 Routine Transfer to another Facility (non-emergent condition) []  - 0 Routine Hospital Admission (non-emergent condition) []  - 0 New Admissions / Biomedical engineer / Ordering NPWT Apligraf, etc. , []  - 0 Emergency Hospital Admission (emergent condition) X- 1 10 Simple Discharge Coordination []  - 0 Complex (extensive) Discharge Coordination PROCESS - Special Needs []  - 0 Pediatric / Minor Patient Management []  - 0 Isolation Patient Management []  - 0 Hearing / Language / Visual special needs []  - 0 Assessment of Community assistance (transportation, D/C  planning, etc.) []  - 0 Additional assistance / Altered mentation []  - 0 Support Surface(s) Assessment (bed, cushion, seat, etc.) INTERVENTIONS - Wound Cleansing / Measurement []  - 0 Simple  Wound Cleansing - one wound X- 2 5 Complex Wound Cleansing - multiple wounds X- 1 5 Wound Imaging (photographs - any number of wounds) []  - 0 Wound Tracing (instead of photographs) []  - 0 Simple Wound Measurement - one wound X- 2 5 Complex Wound Measurement - multiple wounds INTERVENTIONS - Wound Dressings []  - 0 Small Wound Dressing one or multiple wounds X- 2 15 Medium Wound Dressing one or multiple wounds []  - 0 Large Wound Dressing one or multiple wounds []  - 0 Application of Medications - topical []  - 0 Application of Medications - injection INTERVENTIONS - Miscellaneous []  - 0 External ear exam []  - 0 Specimen Collection (cultures, biopsies, blood, body fluids, etc.) []  - 0 Specimen(s) / Culture(s) sent or taken to Lab for analysis []  - 0 Patient Transfer (multiple staff / Civil Service fast streamer / Similar devices) []  - 0 Simple Staple / Suture removal (25 or less) []  - 0 Complex Staple / Suture removal (26 or more) []  - 0 Hypo / Hyperglycemic Management (close monitor of Blood Glucose) []  - 0 Ankle / Brachial Index (ABI) - do not check if billed separately X- 1 5 Vital Signs Has the patient been seen at the hospital within the last three years: Yes Total Score: 125 Level Of Care: New/Established - Level 4 Electronic Signature(s) Signed: 04/08/2021 5:19:32 PM By: Levan Hurst RN, BSN Entered By: Levan Hurst on 04/08/2021 11:55:56 -------------------------------------------------------------------------------- Encounter Discharge Information Details Patient Name: Date of Service: [name], [place] 04/08/2021 10:00 A M Medical Record Number: [id number] Patient Account Number: [id number] Date of Birth/Sex: Treating RN: [birth year] (45 y.o. [name] Primary Care Reynaldo Rossman:  [name] Other Clinician: Referring Charlize Hathaway: Treating Leshae Mcclay/Extender: [name] in Treatment: 38 Encounter Discharge Information Items Discharge Condition: Stable Ambulatory Status: Wheelchair Discharge Destination: Home Transportation: Private Auto Accompanied By: alone Schedule Follow-up Appointment: Yes Clinical Summary of Care: Patient Declined Electronic Signature(s) Signed: 04/08/2021 5:19:32 PM By: Levan Hurst RN, BSN Entered By: Levan Hurst on 04/08/2021 11:57:18 -------------------------------------------------------------------------------- Lower Extremity Assessment Details Patient Name: Date of Service: [name], [place] 04/08/2021 10:00 A M Medical Record Number: [id number] Patient Account Number: [id number] Date of Birth/Sex: Treating RN: February 20, 1976 (45 y.o. [name] Primary Care Ranee Peasley: [name] Other Clinician: Referring Stephenie Navejas: Treating [name] Hannis/Extender: [name] in Treatment: 38 Edema Assessment Assessed: [name]: No] [name]: No] Edema: [Left: No] [Right: No] Calf Left: Right: Point of Measurement: 29 cm From Medial Instep 42.5 cm 44 cm Ankle Left: Right: Point of Measurement: 9 cm From Medial Instep 26 cm 25.5 cm Vascular Assessment Pulses: Dorsalis Pedis Palpable: [Left:Yes] [Right:Yes] Electronic Signature(s) Signed: 04/08/2021 5:16:40 PM By: Baruch Gouty RN, BSN Entered By: Baruch Gouty on 04/08/2021 10:01:34 -------------------------------------------------------------------------------- Multi Wound Chart Details Patient Name: Date of Service: [name], [place] 04/08/2021 10:00 A M Medical Record Number: [id number] Patient Account Number: [id number] Date of Birth/Sex: Treating RN: 10-19-1975 (45 y.o. [name], [name] Primary Care Rosezella Kronick: [name] Other Clinician: Referring Johnye Kist: Treating Breanna Mcdaniel/Extender: [name] in Treatment:  38 Vital Signs Height(in): 55 Pulse(bpm): 47 Weight(lbs): 280 Blood Pressure(mmHg): 127/86 Body Mass Index(BMI): 41 Temperature(F): 98.7 Respiratory Rate(breaths/min): 18 Photos: [N/A:N/A] Right Calcaneus Left Calcaneus N/A Wound Location: Pressure Injury Pressure Injury N/A Wounding Event: Pressure Ulcer Pressure Ulcer N/A Primary Etiology: Asthma, Angina, Hypertension Asthma, Angina, Hypertension N/A Comorbid History: 10/07/2019 10/07/2019 N/A Date Acquired: 3 38 N/A Weeks of Treatment: Open Open N/A Wound Status: 1.5x1.5x0.3  3.8x1.1x0.3 N/A Measurements L x W x D (cm) 1.767 3.283 N/A A (cm) : rea 0.53 0.985 N/A Volume (cm) : 94.40% 87.90% N/A % Reduction in A rea: 98.30% 96.40% N/A % Reduction in Volume: 12 Starting Position 1 (o'clock): 5 Ending Position 1 (o'clock): 0.4 Maximum Distance 1 (cm): No Yes N/A Undermining: Category/Stage III Category/Stage III N/A Classification: Medium Medium N/A Exudate A mount: Serosanguineous Serosanguineous N/A Exudate Type: red, brown red, brown N/A Exudate Color: Thickened Thickened N/A Wound Margin: Large (67-100%) Large (67-100%) N/A Granulation A mount: Red, Pink Red, Pale N/A Granulation Quality: Small (1-33%) Small (1-33%) N/A Necrotic A mount: Fat Layer (Subcutaneous Tissue): Yes Fat Layer (Subcutaneous Tissue): Yes N/A Exposed Structures: Fascia: No Fascia: No Tendon: No Tendon: No Muscle: No Muscle: No Joint: No Joint: No Bone: No Bone: No Small (1-33%) Small (1-33%) N/A Epithelialization: Treatment Notes Electronic Signature(s) Signed: 04/08/2021 5:31:22 PM By: Linton Ham MD Signed: 04/08/2021 5:36:38 PM By: [name] Entered By: Linton Ham on 04/08/2021 11:19:54 -------------------------------------------------------------------------------- Multi-Disciplinary Care Plan Details Patient Name: Date of Service: [name], [place] 04/08/2021 10:00 A M Medical Record Number:  [id number] Patient Account Number: [id number] Date of Birth/Sex: Treating RN: 08/20/1975 (45 y.o. [name] Primary Care Damyon Mullane: [name] Other Clinician: Referring Nefi Musich: Treating Kahne Helfand/Extender: [name] in Treatment: 38 Multidisciplinary Care Plan reviewed with physician Active Inactive Abuse / Safety / Falls / Self Care Management Nursing Diagnoses: Potential for falls Goals: Patient/caregiver will verbalize/demonstrate measures taken to prevent injury and/or falls Date Initiated: 07/15/2020 Target Resolution Date: 04/29/2021 Goal Status: Active Interventions: Assess fall risk on admission and as needed Assess impairment of mobility on admission and as needed per policy Notes: Wound/Skin Impairment Nursing Diagnoses: Impaired tissue integrity Knowledge deficit related to ulceration/compromised skin integrity Goals: Patient/caregiver will verbalize understanding of skin care regimen Date Initiated: 07/15/2020 Target Resolution Date: 05/06/2021 Goal Status: Active Ulcer/skin breakdown will have a volume reduction of 30% by week 4 Date Initiated: 07/15/2020 Date Inactivated: 08/20/2020 Target Resolution Date: 09/03/2020 Goal Status: Unmet Unmet Reason: no major changes. Ulcer/skin breakdown will heal within 14 weeks Date Initiated: 12/04/2020 Date Inactivated: 12/10/2020 Target Resolution Date: 12/10/2020 Unmet Reason: wounds still open at 14 Goal Status: Unmet weeks and today 21 weeks. Interventions: Assess patient/caregiver ability to obtain necessary supplies Assess patient/caregiver ability to perform ulcer/skin care regimen upon admission and as needed Assess ulceration(s) every visit Provide education on ulcer and skin care Treatment Activities: Skin care regimen initiated : 07/15/2020 Topical wound management initiated : 07/15/2020 Notes: Electronic Signature(s) Signed: 04/08/2021 5:16:40 PM By: Baruch Gouty RN,  BSN Entered By: Baruch Gouty on 04/08/2021 10:08:36 -------------------------------------------------------------------------------- Pain Assessment Details Patient Name: Date of Service: [name], [place] E. 04/08/2021 10:00 [place] Record Number: [id number] Patient Account Number: [id number] Date of Birth/Sex: Treating RN: [birth year] (45 y.o. [name] Primary Care Shlomie Romig: [name] Other Clinician: Referring Helane Briceno: Treating Hanny Elsberry/Extender: [name] in Treatment: 38 Active Problems Location of Pain Severity and Description of Pain Patient Has Paino Yes Site Locations Pain Location: Pain in Ulcers With Dressing Change: No Duration of the Pain. Constant / Intermittento Intermittent Rate the pain. Current Pain Level: 4 Worst Pain Level: 5 Least Pain Level: 0 Character of Pain Describe the Pain: Aching, Tender Pain Management and Medication Current Pain Management: Rest: Yes Is the Current Pain Management Adequate: Adequate How does your wound impact your activities of daily livingo Sleep: No Bathing: No Appetite: No  Relationship With Others: No Bladder Continence: No Emotions: No Bowel Continence: No Work: No Toileting: No Drive: No Dressing: No Hobbies: No Electronic Signature(s) Signed: 04/08/2021 5:16:40 PM By: Baruch Gouty RN, BSN Entered By: Baruch Gouty on 04/08/2021 09:55:50 -------------------------------------------------------------------------------- Patient/Caregiver Education Details Patient Name: Date of Service: [name], [name] 9/1/2022andnbsp10:00 A M Medical Record Number: [id number] Patient Account Number: [id number] Date of Birth/Gender: Treating RN: 29-Dec-1975 (45 y.o. [name] Primary Care Physician: [name] Other Clinician: Referring Physician: Treating Physician/Extender: [name] in Treatment: 90 Education Assessment Education Provided  To: Patient Education Topics Provided Offloading: Methods: Explain/Verbal Responses: Reinforcements needed, State content correctly Wound/Skin Impairment: Methods: Explain/Verbal Responses: Reinforcements needed, State content correctly Electronic Signature(s) Signed: 04/08/2021 5:16:40 PM By: Baruch Gouty RN, BSN Entered By: Baruch Gouty on 04/08/2021 10:09:02 -------------------------------------------------------------------------------- Wound Assessment Details Patient Name: Date of Service: [name], [place] 04/08/2021 10:00 A M Medical Record Number: [id number] Patient Account Number: [id number] Date of Birth/Sex: Treating RN: Jun 03, 1976 (45 y.o. [name], [name] Primary Care Autymn Omlor: [name] Other Clinician: Referring Zaccheus Edmister: Treating Nikoli Nasser/Extender: [name] in Treatment: 38 Wound Status Wound Number: 1 Primary Etiology: Pressure Ulcer Wound Location: Right Calcaneus Wound Status: Open Wounding Event: Pressure Injury Comorbid History: Asthma, Angina, Hypertension Date Acquired: 10/07/2019 Weeks Of Treatment: 38 Clustered Wound: No Photos Wound Measurements Length: (cm) 1.5 Width: (cm) 1.5 Depth: (cm) 0.3 Area: (cm) 1.767 Volume: (cm) 0.53 % Reduction in Area: 94.4% % Reduction in Volume: 98.3% Epithelialization: Small (1-33%) Tunneling: No Undermining: No Wound Description Classification: Category/Stage III Wound Margin: Thickened Exudate Amount: Medium Exudate Type: Serosanguineous Exudate Color: red, brown Foul Odor After Cleansing: No Slough/Fibrino No Wound Bed Granulation Amount: Large (67-100%) Exposed Structure Granulation Quality: Red, Pink Fascia Exposed: No Necrotic Amount: Small (1-33%) Fat Layer (Subcutaneous Tissue) Exposed: Yes Necrotic Quality: Adherent Slough Tendon Exposed: No Muscle Exposed: No Joint Exposed: No Bone Exposed: No Treatment Notes Wound #1 (Calcaneus) Wound Laterality:  Right Cleanser Normal Saline Discharge Instruction: Cleanse the wound with Normal Saline prior to applying a clean dressing using gauze sponges, not tissue or cotton balls. Wound Cleanser Discharge Instruction: Cleanse the wound with wound cleanser prior to applying a clean dressing using gauze sponges, not tissue or cotton balls. Peri-Wound Care Topical Primary Dressing KerraCel Ag Gelling Fiber Dressing, 2x2 in (silver alginate) Discharge Instruction: ****Ensure to apply into divot area of wound.**** Apply silver alginate to wound bed as instructed Secondary Dressing Zetuvit Plus 4x8 in Discharge Instruction: Apply over primary dressing as directed. Secured With Elastic Bandage 4 inch (ACE bandage) Discharge Instruction: Secure with ACE bandage as directed. Kerlix Roll Sterile, 4.5x3.1 (in/yd) Discharge Instruction: Secure with Kerlix as directed. Compression Wrap Compression Stockings Add-Ons Electronic Signature(s) Signed: 04/08/2021 5:16:40 PM By: Baruch Gouty RN, BSN Signed: 04/08/2021 5:36:38 PM By: [name] Entered By: Baruch Gouty on 04/08/2021 10:06:54 -------------------------------------------------------------------------------- Wound Assessment Details Patient Name: Date of Service: [name], [place] 04/08/2021 10:00 A M Medical Record Number: [id number] Patient Account Number: [id number] Date of Birth/Sex: Treating RN: [birth year] (45 y.o. [name] Primary Care Pattrick Bady: [name] Other Clinician: Referring Dinna Severs: Treating Burris Matherne/Extender: [name] in Treatment: 38 Wound Status Wound Number: 2 Primary Etiology: Pressure Ulcer Wound Location: Left Calcaneus Wound Status: Open Wounding Event: Pressure Injury Comorbid History: Asthma, Angina, Hypertension Date Acquired: 10/07/2019 Weeks Of Treatment: 38 Clustered Wound: No Photos Wound Measurements Length: (cm) 3.8 Width: (cm) 1.1 Depth: (cm) 0.3 Area: (  cm)  3.283 Volume: (cm) 0.985 % Reduction in Area: 87.9% % Reduction in Volume: 96.4% Epithelialization: Small (1-33%) Tunneling: No Undermining: Yes Starting Position (o'clock): 12 Ending Position (o'clock): 5 Maximum Distance: (cm) 0.4 Wound Description Classification: Category/Stage III Wound Margin: Thickened Exudate Amount: Medium Exudate Type: Serosanguineous Exudate Color: red, brown Foul Odor After Cleansing: No Slough/Fibrino No Wound Bed Granulation Amount: Large (67-100%) Exposed Structure Granulation Quality: Red, Pale Fascia Exposed: No Necrotic Amount: Small (1-33%) Fat Layer (Subcutaneous Tissue) Exposed: Yes Necrotic Quality: Adherent Slough Tendon Exposed: No Muscle Exposed: No Joint Exposed: No Bone Exposed: No Treatment Notes Wound #2 (Calcaneus) Wound Laterality: Left Cleanser Normal Saline Discharge Instruction: Cleanse the wound with Normal Saline prior to applying a clean dressing using gauze sponges, not tissue or cotton balls. Wound Cleanser Discharge Instruction: Cleanse the wound with wound cleanser prior to applying a clean dressing using gauze sponges, not tissue or cotton balls. Peri-Wound Care Topical Primary Dressing KerraCel Ag Gelling Fiber Dressing, 2x2 in (silver alginate) Discharge Instruction: ****Ensure to apply into divot area of wound.**** Apply silver alginate to wound bed as instructed Secondary Dressing Woven Gauze Sponge, Non-Sterile 4x4 in Discharge Instruction: Apply over primary dressing as directed. Woven Gauze Sponges 2x2 in Discharge Instruction: Apply over primary dressing as directed. Zetuvit Plus 4x8 in Discharge Instruction: Apply over primary dressing as directed. Secured With Elastic Bandage 4 inch (ACE bandage) Discharge Instruction: Secure with ACE bandage as directed. Kerlix Roll Sterile, 4.5x3.1 (in/yd) Discharge Instruction: Secure with Kerlix as directed. Compression Wrap Compression  Stockings Add-Ons Electronic Signature(s) Signed: 04/08/2021 5:16:40 PM By: Baruch Gouty RN, BSN Signed: 04/08/2021 5:36:38 PM By: [name] Entered By: Baruch Gouty on 04/08/2021 10:06:17 -------------------------------------------------------------------------------- Vitals Details Patient Name: Date of Service: [name], [place] 04/08/2021 10:00 A M Medical Record Number: [id number] Patient Account Number: [id number] Date of Birth/Sex: Treating RN: 01/02/1976 (45 y.o. [name] Primary Care Eulla Kochanowski: [name] Other Clinician: Referring Natanel Snavely: Treating Kaden Daughdrill/Extender: [name] in Treatment: 38 Vital Signs Time Taken: 09:54 Temperature (F): 98.7 Height (in): 69 Pulse (bpm): 88 Source: Stated Respiratory Rate (breaths/min): 18 Weight (lbs): 280 Blood Pressure (mmHg): 127/86 Source: Stated Reference Range: 80 - 120 mg / dl Body Mass Index (BMI): 41.3 Electronic Signature(s) Signed: 04/08/2021 5:16:40 PM By: Baruch Gouty RN, BSN Entered By: Baruch Gouty on 04/08/2021 09:55:00

## 2021-04-08 NOTE — Progress Notes (Signed)
EVERTON, BERTHA ([id number]) Visit Report for 04/08/2021 HPI Details Patient Name: Date of Service: [name], [place] 04/08/2021 10:00 A M Medical Record Number: [id number] Patient Account Number: [id number] Date of Birth/Sex: Treating RN: [birth year] (45 y.o. [name] Primary Care Provider: [name] Other Clinician: Referring Provider: Treating Provider/Extender: [name] in Treatment: 27 History of Present Illness HPI Description: 07/15/2020 upon evaluation today patient presents for initial inspection here in our clinic concerning issues he has been having with the bottoms of his feet bilaterally. He states these actually occurred as wounds when he was hospitalized for 5 months secondary to Covid. He was apparently with tilting bed where he was in an upright position quite frequently and apparently this occurred in some way shape or form during that time. Fortunately there is no sign of active infection at this time. No fevers, chills, nausea, vomiting, or diarrhea. With that being said he still has substantial wounds on the plantar aspects of his feet Theragen require quite a bit of work to get these to heal. He has been using Santyl currently though that is been problematic both in receiving the medication as well as actually paid for it as it is become quite expensive. Prior to the experience with Covid the patient really did not have any major medical problems other than hypertension he does have some mild generalized weakness following the Covid experience. 07/22/2020 on evaluation today patient appears to be doing okay in regard to his foot ulcers I feel like the wound beds are showing signs of better improvement that I do believe the Iodoflex is helping in this regard. With that being said he does have a lot of drainage currently and this is somewhat blue/green in nature which is consistent with Pseudomonas. I do think a culture today would be appropriate  for [place] to evaluate and see if that is indeed the case I would likely start him on antibiotic orally as well he is not allergic to Cipro knows of no issues he has had in the past 12/21; patient was admitted to the clinic earlier this month with bilateral presumed pressure ulcers on the bottom of his feet apparently related to excessive pressure from a tilt table arrangement in the intensive care unit. Patient relates this to being on ECMO but I am not really sure that is exactly related to that. I must say I have never seen anything like this. He has fairly extensive full-thickness wounds extending from his heel towards his midfoot mostly centered laterally. There is already been some healing distally. He does not appear to have an arterial issue. He has been using gentamicin to the wound surfaces with Iodoflex to help with ongoing debridement 1/6; this is a patient with pressure ulcers on the bottom of his feet related to excessive pressure from a standing position in the intensive care unit. He is complaining of a lot of pain in the right heel. He is not a diabetic. 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 We have been using Iodoflex to help prepare the surfaces of both wounds for an advanced treatment product. He is nonambulatory spending most of his time in a wheelchair I have asked him not to propel the wheelchair with his heels 1/13; in general his wounds look better not much surface area change we have been using Iodoflex as of last week. I did an x-ray of the right heel as the patient was complaining of pain. I  had some thoughts about a stress fracture perhaps Achilles tendon problems however what it showed was erosive changes along the inferior aspect of the calcaneus he now has a MRI booked for 1/20. 1/20; in general his wounds continue to be better. Some improvement in the large narrow areas proximally in his foot. He is still complaining of pain in the right heel  and tenderness in certain areas of this wound. His MRI is tonight. I am not just looking for osteomyelitis that was brought up on the x-ray I am wondering about stress fractures, tendon ruptures etc. He has no such findings on the left. Also noteworthy is that the patient had critical illness neuropathy and some of the discomfort may be actual improvement in nerve function I am just not sure. These wounds were initially in the setting of severe critical illness related to COVID-19. He was put in a standing position. He may have also been on pressors at the point contributing to tissue ischemia. By his description at some point these wounds were grossly necrotic extending proximally up into the Achilles part of his heel. I do not know that I have ever really seen pictures of them like this although they may exist in epic We have ordered Tri layer Oasis. I am trying to stimulate some granulation in these areas. This is of course assuming the MRI is negative for infection 1/27; since the patient was last here he saw Dr. Juleen China of infectious disease. He is planned for vancomycin and ceftriaxone. Prior operative culture grew MSSA. Also ordered baseline lab work. He also ordered arterial studies although the ABIs in our clinic were normal as well as his clinical exam these were normal I do not think he needs to see vascular surgery. His ABIs at the P[facility] were 1.22 in the right triphasic waveforms with a normal TBI of 1.15 on the left ABI of 1.22 with triphasic waveforms and a normal TBI of 1.08. Finally he saw Dr. Amalia Hailey who will follow him in for 2 months. At this point I do not think he felt that he needed a procedure on the right calcaneal bone. Dr. Juleen China is elected for broad-spectrum antibiotic The patient is still having pain in the right heel. He walks with a walker 2/3; wounds are generally smaller. He is tolerating his IV antibiotics. I believe this is vancomycin and ceftriaxone. We are still  waiting for Oasis burn in terms of his out-of-pocket max which he should be meeting soon given the IV antibiotics, MRIs etc. I have asked him to check in on this. We are using silver collagen in the meantime the wounds look better 2/10; tolerating IV vancomycin and Rocephin. We are waiting to apply for Oasis. Although I am not really sure where he is in his out-of-pocket max. 2/17 started the first application of Oasis trilayer. Still on antibiotics. The wounds have generally look better. The area on the left has a little more surface slough requiring debridement 8/11; second application of Oasis trilayer. The wound surface granulation is generally look better. The area on the left with undermining laterally I think is come in a bit. 10/08/2020 upon evaluation today patient is here today for [employer] application #3. Fortunately he seems to be doing extremely well with regard to this and we are seeing a lot of new epithelial growth which is great news. Fortunately there is no signs of active infection at this time. 10/16/2020 upon evaluation today patient appears to be doing well with regard  to his foot ulcers. Do believe the Oasis has been of benefit for him. I do not see any signs of infection right now which is great news and I think that he has a lot of new epithelial growth which is great to see as well. The patient is very pleased to hear all of this. I do think we can proceed with the Oasis trilayer #4 today. 3/18; not as much improvement in these areas on his heels that I was hoping. I did reapply trilateral Oasis today the tissue looks healthier but not as much fill in as I was hoping. 3/25; better looking today I think this is come in a bit the tissue looks healthier. Triple layer Oasis reapplied #6 4/1; somewhat better looking definitely better looking surface not as much change in surface area as I was hoping. He may be spending more time [place] on days then he needs to although he does  have heel offloading boots. Triple layer Oasis reapplied #7 4/7; unfortunately apparently [facility] will not approve any further Oasis which is unfortunate since the patient did respond nicely both in terms of the condition of the wound bed as well as surface area. There is still some drainage coming from the wound but not a lot there does not appear to be any infection 4/15; we have been using Hydrofera Blue. He continues to have nice rims of epithelialization on the right greater than the left. The left the epithelialization is coming from the tip of his heel. There is moderate drainage. In this that concerns me about a total contact cast. There is no evidence of infection 4/29; patient has been using Hydrofera Blue with dressing changes. He has no complaints or issues today. 5/5; using Hydrofera Blue. I actually think that he looks marginally better than the last time I saw this 3 weeks ago. There are rims of epithelialization on the left thumb coming from the medial side on the right. Using Hydrofera Blue 5/12; using Hydrofera Blue. These continue to make improvements in surface area. His drainage was not listed as severe I therefore went ahead and put a cast on the left foot. Right foot we will continue to dress his previous 5/16; back for first total contact cast change. He did not tolerate this particularly well cast injury on the anterior tibia among other issues. Difficulty sleeping. I talked him about this in some detail and afterwards is elected to continue. I told him I would like to have a cast on for 3 weeks to see if this is going to help at all. I think he agreed 5/19; I think the wound is better. There is no tunneling towards his midfoot. The undermining medially also looks better. He has a rim of new skin distally. I think we are making progress here. The area on the left also continues to look somewhat better to me using Hydrofera Blue. He has a list of complaints about  the cast but none of them seem serious 5/26; patient presents for 1 week follow-up. He has been using a total contact cast and tolerating this well. Hydrofera Blue is the main dressing used. He denies signs of infection. 6/2 Hydrofera Blue total contact cast on the left. These were large ulcers that formed in intensive care unit where the patient was recovering from [place]. May have had something to do with being ventilated in an upright positiono Pressors etc. We have been able to get the areas down considerably and a  viable surface. There is some epithelialization in both sides. Note made of drainage 6/9; changed to [facility] last time because of drainage. He arrives with better looking surfaces and dimensions on the left than the right. Paradoxically the right actually probes more towards his midfoot the left is largely close down but both of these look improved. Using a total contact cast on the left 6/16; complex wounds on his bilateral plantar heels which were initially pressure injury from a stay in the ICU with COVID. We have been using silver alginate most recently. His dimensions of come in quite dramatically however not recently. We have been putting the left foot in a total contact cast 6/23; complex wounds on the bilateral plantar heels. I been putting the left in the cast paradoxically the area on the right is the one that is going towards closure at a faster rate. Quite a bit of drainage on the left. The patient went to see Dr. Amalia Hailey who said he was going to standby for skin grafts. I had actually considered sending him for skin grafts however he would be mandatorily off his feet for a period of weeks to months. I am thinking that the area on the right is going to close on its own the area on the left has been more stubborn even though we have him in a total contact cast 6/30; took him out of a total contact cast last week is the right heel seem to be making better progress than  the left where I was placing the cast. We are using silver alginate. Both wounds are smaller right greater than left 7/12; both wounds look as though they are making some progress. We are using silver alginate. Heel offloading boots 7/26; very gradual progress especially on the right. Using silver alginate. He is wearing heel offloading boots 8/18; he continues to close these wounds down very gradually. Using silver alginate. The problem polymen being definitive about this is areas of what appears to be callus around the margins. This is not a 100% of the area but certainly sizable especially on the right 9/1; bilateral plantar feet wounds secondary to prolonged pressure while being ventilated for COVID-19 in an upright position. Essentially pressure ulcers on the bottom of his feet. He is made substantial progress using silver alginate. Electronic Signature(s) Signed: 04/08/2021 5:31:22 PM By: Linton Ham MD Entered By: Linton Ham on 04/08/2021 11:23:30 -------------------------------------------------------------------------------- Physical Exam Details Patient Name: Date of Service: [name], [place] 04/08/2021 10:00 A M Medical Record Number: [id number] Patient Account Number: [id number] Date of Birth/Sex: Treating RN: 12-26-1975 (45 y.o. [name] Primary Care Provider: [name] Other Clinician: Referring Provider: Treating Provider/Extender: [name] in Treatment: 38 Constitutional Sitting or standing Blood Pressure is within target range for patient.. Pulse regular and within target range for patient.Marland Kitchen Respirations regular, non-labored and within target range.. Temperature is normal and within the target range for the patient.Marland Kitchen Appears in no distress. Cardiovascular Pedal pulses are palpable. Notes Wound exam; bilateral plantar heels. Measurements are better tissue looks healthy. No evidence of surrounding infection no uncontrolled  edema. Electronic Signature(s) Signed: 04/08/2021 5:31:22 PM By: Linton Ham MD Entered By: Linton Ham on 04/08/2021 11:24:16 -------------------------------------------------------------------------------- Physician Orders Details Patient Name: Date of Service: [name], [place] 04/08/2021 10:00 A M Medical Record Number: [id number] Patient Account Number: [id number] Date of Birth/Sex: Treating RN: 1976-05-12 (45 y.o. [name] Primary Care Provider: [name] Other Clinician: Referring Provider:  Treating Provider/Extender: [name] in Treatment: 38 Verbal / Phone Orders: No Diagnosis Coding ICD-10 Coding Code Description L97.512 Non-pressure chronic ulcer of other part of right foot with fat layer exposed L89.893 Pressure ulcer of other site, stage 3 L97.522 Non-pressure chronic ulcer of other 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 Follow-up Appointments ppointment in 2 weeks. - with Dr. Dellia Nims Return A Bathing/ Shower/ Hygiene May shower with protection but do not get wound dressing(s) wet. Edema Control - Lymphedema / SCD / Other Bilateral Lower Extremities Elevate legs to the level of the heart or above for 30 minutes daily and/or when sitting, a frequency of: - throughout the day Avoid standing for long periods of time. Moisturize legs daily. - right leg and foot every night. Off-Loading Wedge shoe to: - Glophed Shoe to both feet. Other: - keep pressure off of the bottom of your feet Additional Orders / Instructions Follow Nutritious Diet Wound Treatment Wound #1 - Calcaneus Wound Laterality: Right Cleanser: Normal Saline (Generic) Every Other Day/30 Days Discharge Instructions: Cleanse the wound with Normal Saline prior to applying a clean dressing using gauze sponges, not tissue or cotton balls. Cleanser: Wound Cleanser Every Other Day/30 Days Discharge Instructions: Cleanse the wound with wound cleanser prior to applying  a clean dressing using gauze sponges, not tissue or cotton balls. Prim Dressing: KerraCel Ag Gelling Fiber Dressing, 2x2 in (silver alginate) (Generic) Every Other Day/30 Days ary Discharge Instructions: ****Ensure to apply into divot area of wound.**** Apply silver alginate to wound bed as instructed Secondary Dressing: Zetuvit Plus 4x8 in (Generic) Every Other Day/30 Days Discharge Instructions: Apply over primary dressing as directed. Secured With: Elastic Bandage 4 inch (ACE bandage) Every Other Day/30 Days Discharge Instructions: Secure with ACE bandage as directed. Secured With: [employer], 4.5x3.1 (in/yd) (Generic) Every Other Day/30 Days Discharge Instructions: Secure with Kerlix as directed. Wound #2 - Calcaneus Wound Laterality: Left Cleanser: Normal Saline (Generic) Every Other Day/30 Days Discharge Instructions: Cleanse the wound with Normal Saline prior to applying a clean dressing using gauze sponges, not tissue or cotton balls. Cleanser: Wound Cleanser Every Other Day/30 Days Discharge Instructions: Cleanse the wound with wound cleanser prior to applying a clean dressing using gauze sponges, not tissue or cotton balls. Prim Dressing: KerraCel Ag Gelling Fiber Dressing, 2x2 in (silver alginate) (Generic) Every Other Day/30 Days ary Discharge Instructions: ****Ensure to apply into divot area of wound.**** Apply silver alginate to wound bed as instructed Secondary Dressing: Woven Gauze Sponge, Non-Sterile 4x4 in (DME) (Generic) Every Other Day/30 Days Discharge Instructions: Apply over primary dressing as directed. Secondary Dressing: Woven Gauze Sponges 2x2 in (DME) (Generic) Every Other Day/30 Days Discharge Instructions: Apply over primary dressing as directed. Secondary Dressing: Zetuvit Plus 4x8 in (Generic) Every Other Day/30 Days Discharge Instructions: Apply over primary dressing as directed. Secured With: Elastic Bandage 4 inch (ACE bandage) Every Other Day/30  Days Discharge Instructions: Secure with ACE bandage as directed. Secured With: [employer], 4.5x3.1 (in/yd) (Generic) Every Other Day/30 Days Discharge Instructions: Secure with Kerlix as directed. Electronic Signature(s) Signed: 04/08/2021 5:19:32 PM By: Levan Hurst RN, BSN Signed: 04/08/2021 5:31:22 PM By: Linton Ham MD Entered By: Levan Hurst on 04/08/2021 10:35:32 -------------------------------------------------------------------------------- Problem List Details Patient Name: Date of Service: [name], [place] 04/08/2021 10:00 A M Medical Record Number: [id number] Patient Account Number: [id number] Date of Birth/Sex: Treating RN: [birth year] (45 y.o. [name] Primary Care Provider: [name] Other Clinician: Referring Provider:  Treating Provider/Extender: [name] in Treatment: 38 Active Problems ICD-10 Encounter Code Description Active Date MDM Diagnosis L97.512 Non-pressure chronic ulcer of other part of right foot with fat layer exposed 09/03/2020 No Yes L89.893 Pressure ulcer of other site, stage 3 07/15/2020 No Yes L97.522 Non-pressure chronic ulcer of other 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 09/03/2020 No Yes Inactive Problems ICD-10 Code Description Active Date Inactive Date M62.81 Muscle weakness (generalized) 07/15/2020 07/15/2020 I10 Essential (primary) hypertension 07/15/2020 07/15/2020 M86.171 Other acute osteomyelitis, right ankle and foot 09/03/2020 09/03/2020 Resolved Problems Electronic Signature(s) Signed: 04/08/2021 5:31:22 PM By: Linton Ham MD Entered By: Linton Ham on 04/08/2021 11:19:47 -------------------------------------------------------------------------------- Progress Note Details Patient Name: Date of Service: [name], [place] 04/08/2021 10:00 A M Medical Record Number: [id number] Patient Account Number: [id number] Date of Birth/Sex: Treating RN: 1975-08-14 (45 y.o. [name] Primary Care Provider: [name] Other Clinician: Referring Provider: Treating Provider/Extender: [name] in Treatment: 38 Subjective History of Present Illness (HPI) 07/15/2020 upon evaluation today patient presents for initial inspection here in our clinic concerning issues he has been having with the bottoms of his feet bilaterally. He states these actually occurred as wounds when he was hospitalized for 5 months secondary to Covid. He was apparently with tilting bed where he was in an upright position quite frequently and apparently this occurred in some way shape or form during that time. Fortunately there is no sign of active infection at this time. No fevers, chills, nausea, vomiting, or diarrhea. With that being said he still has substantial wounds on the plantar aspects of his feet Theragen require quite a bit of work to get these to heal. He has been using Santyl currently though that is been problematic both in receiving the medication as well as actually paid for it as it is become quite expensive. Prior to the experience with Covid the patient really did not have any major medical problems other than hypertension he does have some mild generalized weakness following the Covid experience. 07/22/2020 on evaluation today patient appears to be doing okay in regard to his foot ulcers I feel like the wound beds are showing signs of better improvement that I do believe the Iodoflex is helping in this regard. With that being said he does have a lot of drainage currently and this is somewhat blue/green in nature which is consistent with Pseudomonas. I do think a culture today would be appropriate for [place] to evaluate and see if that is indeed the case I would likely start him on antibiotic orally as well he is not allergic to Cipro knows of no issues he has had in the past 12/21; patient was admitted to the clinic earlier this month with bilateral presumed  pressure ulcers on the bottom of his feet apparently related to excessive pressure from a tilt table arrangement in the intensive care unit. Patient relates this to being on ECMO but I am not really sure that is exactly related to that. I must say I have never seen anything like this. He has fairly extensive full-thickness wounds extending from his heel towards his midfoot mostly centered laterally. There is already been some healing distally. He does not appear to have an arterial issue. He has been using gentamicin to the wound surfaces with Iodoflex to help with ongoing debridement 1/6; this is a patient with pressure ulcers on the bottom of his feet related to excessive pressure from a standing position  in the intensive care unit. He is complaining of a lot of pain in the right heel. He is not a diabetic. 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 We have been using Iodoflex to help prepare the surfaces of both wounds for an advanced treatment product. He is nonambulatory spending most of his time in a wheelchair I have asked him not to propel the wheelchair with his heels 1/13; in general his wounds look better not much surface area change we have been using Iodoflex as of last week. I did an x-ray of the right heel as the patient was complaining of pain. I had some thoughts about a stress fracture perhaps Achilles tendon problems however what it showed was erosive changes along the inferior aspect of the calcaneus he now has a MRI booked for 1/20. 1/20; in general his wounds continue to be better. Some improvement in the large narrow areas proximally in his foot. He is still complaining of pain in the right heel and tenderness in certain areas of this wound. His MRI is tonight. I am not just looking for osteomyelitis that was brought up on the x-ray I am wondering about stress fractures, tendon ruptures etc. He has no such findings on the left. Also noteworthy is that the  patient had critical illness neuropathy and some of the discomfort may be actual improvement in nerve function I am just not sure. These wounds were initially in the setting of severe critical illness related to COVID-19. He was put in a standing position. He may have also been on pressors at the point contributing to tissue ischemia. By his description at some point these wounds were grossly necrotic extending proximally up into the Achilles part of his heel. I do not know that I have ever really seen pictures of them like this although they may exist in epic We have ordered Tri layer Oasis. I am trying to stimulate some granulation in these areas. This is of course assuming the MRI is negative for infection 1/27; since the patient was last here he saw Dr. Juleen China of infectious disease. He is planned for vancomycin and ceftriaxone. Prior operative culture grew MSSA. Also ordered baseline lab work. He also ordered arterial studies although the ABIs in our clinic were normal as well as his clinical exam these were normal I do not think he needs to see vascular surgery. His ABIs at the [facility] were 1.22 in the right triphasic waveforms with a normal TBI of 1.15 on the left ABI of 1.22 with triphasic waveforms and a normal TBI of 1.08. Finally he saw Dr. Amalia Hailey who will follow him in for 2 months. At this point I do not think he felt that he needed a procedure on the right calcaneal bone. Dr. Juleen China is elected for broad-spectrum antibiotic The patient is still having pain in the right heel. He walks with a walker 2/3; wounds are generally smaller. He is tolerating his IV antibiotics. I believe this is vancomycin and ceftriaxone. We are still waiting for Oasis burn in terms of his out-of-pocket max which he should be meeting soon given the IV antibiotics, MRIs etc. I have asked him to check in on this. We are using silver collagen in the meantime the wounds look better 2/10; tolerating IV vancomycin and  Rocephin. We are waiting to apply for Oasis. Although I am not really sure where he is in his out-of-pocket max. 2/17 started the first application of Oasis trilayer. Still on antibiotics.  The wounds have generally look better. The area on the left has a little more surface slough requiring debridement 9/14; second application of Oasis trilayer. The wound surface granulation is generally look better. The area on the left with undermining laterally I think is come in a bit. 10/08/2020 upon evaluation today patient is here today for [employer] application #3. Fortunately he seems to be doing extremely well with regard to this and we are seeing a lot of new epithelial growth which is great news. Fortunately there is no signs of active infection at this time. 10/16/2020 upon evaluation today patient appears to be doing well with regard to his foot ulcers. Do believe the Oasis has been of benefit for him. I do not see any signs of infection right now which is great news and I think that he has a lot of new epithelial growth which is great to see as well. The patient is very pleased to hear all of this. I do think we can proceed with the Oasis trilayer #4 today. 3/18; not as much improvement in these areas on his heels that I was hoping. I did reapply trilateral Oasis today the tissue looks healthier but not as much fill in as I was hoping. 3/25; better looking today I think this is come in a bit the tissue looks healthier. Triple layer Oasis reapplied #6 4/1; somewhat better looking definitely better looking surface not as much change in surface area as I was hoping. He may be spending more time [place] on days then he needs to although he does have heel offloading boots. Triple layer Oasis reapplied #7 4/7; unfortunately apparently [facility] will not approve any further Oasis which is unfortunate since the patient did respond nicely both in terms of the condition of the wound bed as well as  surface area. There is still some drainage coming from the wound but not a lot there does not appear to be any infection 4/15; we have been using Hydrofera Blue. He continues to have nice rims of epithelialization on the right greater than the left. The left the epithelialization is coming from the tip of his heel. There is moderate drainage. In this that concerns me about a total contact cast. There is no evidence of infection 4/29; patient has been using Hydrofera Blue with dressing changes. He has no complaints or issues today. 5/5; using Hydrofera Blue. I actually think that he looks marginally better than the last time I saw this 3 weeks ago. There are rims of epithelialization on the left thumb coming from the medial side on the right. Using Hydrofera Blue 5/12; using Hydrofera Blue. These continue to make improvements in surface area. His drainage was not listed as severe I therefore went ahead and put a cast on the left foot. Right foot we will continue to dress his previous 5/16; back for first total contact cast change. He did not tolerate this particularly well cast injury on the anterior tibia among other issues. Difficulty sleeping. I talked him about this in some detail and afterwards is elected to continue. I told him I would like to have a cast on for 3 weeks to see if this is going to help at all. I think he agreed 5/19; I think the wound is better. There is no tunneling towards his midfoot. The undermining medially also looks better. He has a rim of new skin distally. I think we are making progress here. The area on the left also  continues to look somewhat better to me using Hydrofera Blue. He has a list of complaints about the cast but none of them seem serious 5/26; patient presents for 1 week follow-up. He has been using a total contact cast and tolerating this well. Hydrofera Blue is the main dressing used. He denies signs of infection. 6/2 Hydrofera Blue total contact cast on  the left. These were large ulcers that formed in intensive care unit where the patient was recovering from [place]. May have had something to do with being ventilated in an upright positiono Pressors etc. We have been able to get the areas down considerably and a viable surface. There is some epithelialization in both sides. Note made of drainage 6/9; changed to [facility] last time because of drainage. He arrives with better looking surfaces and dimensions on the left than the right. Paradoxically the right actually probes more towards his midfoot the left is largely close down but both of these look improved. Using a total contact cast on the left 6/16; complex wounds on his bilateral plantar heels which were initially pressure injury from a stay in the ICU with COVID. We have been using silver alginate most recently. His dimensions of come in quite dramatically however not recently. We have been putting the left foot in a total contact cast 6/23; complex wounds on the bilateral plantar heels. I been putting the left in the cast paradoxically the area on the right is the one that is going towards closure at a faster rate. Quite a bit of drainage on the left. The patient went to see Dr. Amalia Hailey who said he was going to standby for skin grafts. I had actually considered sending him for skin grafts however he would be mandatorily off his feet for a period of weeks to months. I am thinking that the area on the right is going to close on its own the area on the left has been more stubborn even though we have him in a total contact cast 6/30; took him out of a total contact cast last week is the right heel seem to be making better progress than the left where I was placing the cast. We are using silver alginate. Both wounds are smaller right greater than left 7/12; both wounds look as though they are making some progress. We are using silver alginate. Heel offloading boots 7/26; very gradual progress  especially on the right. Using silver alginate. He is wearing heel offloading boots 8/18; he continues to close these wounds down very gradually. Using silver alginate. The problem polymen being definitive about this is areas of what appears to be callus around the margins. This is not a 100% of the area but certainly sizable especially on the right 9/1; bilateral plantar feet wounds secondary to prolonged pressure while being ventilated for COVID-19 in an upright position. Essentially pressure ulcers on the bottom of his feet. He is made substantial progress using silver alginate. Objective Constitutional Sitting or standing Blood Pressure is within target range for patient.. Pulse regular and within target range for patient.Marland Kitchen Respirations regular, non-labored and within target range.. Temperature is normal and within the target range for the patient.Marland Kitchen Appears in no distress. Vitals Time Taken: 9:54 AM, Height: 69 in, Source: Stated, Weight: 280 lbs, Source: Stated, BMI: 41.3, Temperature: 98.7 F, Pulse: 88 bpm, Respiratory Rate: 18 breaths/min, Blood Pressure: 127/86 mmHg. Cardiovascular Pedal pulses are palpable. General Notes: Wound exam; bilateral plantar heels. Measurements are better tissue looks healthy. No evidence  of surrounding infection no uncontrolled edema. Integumentary (Hair, Skin) Wound #1 status is Open. Original cause of wound was Pressure Injury. The date acquired was: 10/07/2019. The wound has been in treatment 38 weeks. The wound is located on the Right Calcaneus. The wound measures 1.5cm length x 1.5cm width x 0.3cm depth; 1.767cm^2 area and 0.53cm^3 volume. There is Fat Layer (Subcutaneous Tissue) exposed. There is no tunneling or undermining noted. There is a medium amount of serosanguineous drainage noted. The wound margin is thickened. There is large (67-100%) red, pink granulation within the wound bed. There is a small (1-33%) amount of necrotic tissue within the  wound bed including Adherent Slough. Wound #2 status is Open. Original cause of wound was Pressure Injury. The date acquired was: 10/07/2019. The wound has been in treatment 38 weeks. The wound is located on the Left Calcaneus. The wound measures 3.8cm length x 1.1cm width x 0.3cm depth; 3.283cm^2 area and 0.985cm^3 volume. There is Fat Layer (Subcutaneous Tissue) exposed. There is no tunneling noted, however, there is undermining starting at 12:00 and ending at 5:00 with a maximum distance of 0.4cm. There is a medium amount of serosanguineous drainage noted. The wound margin is thickened. There is large (67-100%) red, pale granulation within the wound bed. There is a small (1-33%) amount of necrotic tissue within the wound bed including Adherent Slough. Assessment Active Problems ICD-10 Non-pressure chronic ulcer of other part of right foot with fat layer exposed Pressure ulcer of other site, stage 3 Non-pressure chronic ulcer of other 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 Plan Follow-up Appointments: Return Appointment in 2 weeks. - with Dr. Arcola Jansky Shower/ Hygiene: May shower with protection but do not get wound dressing(s) wet. Edema Control - Lymphedema / SCD / Other: Elevate legs to the level of the heart or above for 30 minutes daily and/or when sitting, a frequency of: - throughout the day Avoid standing for long periods of time. Moisturize legs daily. - right leg and foot every night. Off-Loading: Wedge shoe to: - Glophed Shoe to both feet. Other: - keep pressure off of the bottom of your feet Additional Orders / Instructions: Follow Nutritious Diet WOUND #1: - Calcaneus Wound Laterality: Right Cleanser: Normal Saline (Generic) Every Other Day/30 Days Discharge Instructions: Cleanse the wound with Normal Saline prior to applying a clean dressing using gauze sponges, not tissue or cotton balls. Cleanser: Wound Cleanser Every Other Day/30 Days Discharge Instructions:  Cleanse the wound with wound cleanser prior to applying a clean dressing using gauze sponges, not tissue or cotton balls. Prim Dressing: KerraCel Ag Gelling Fiber Dressing, 2x2 in (silver alginate) (Generic) Every Other Day/30 Days ary Discharge Instructions: ****Ensure to apply into divot area of wound.**** Apply silver alginate to wound bed as instructed Secondary Dressing: Zetuvit Plus 4x8 in (Generic) Every Other Day/30 Days Discharge Instructions: Apply over primary dressing as directed. Secured With: Elastic Bandage 4 inch (ACE bandage) Every Other Day/30 Days Discharge Instructions: Secure with ACE bandage as directed. Secured With: [employer], 4.5x3.1 (in/yd) (Generic) Every Other Day/30 Days Discharge Instructions: Secure with Kerlix as directed. WOUND #2: - Calcaneus Wound Laterality: Left Cleanser: Normal Saline (Generic) Every Other Day/30 Days Discharge Instructions: Cleanse the wound with Normal Saline prior to applying a clean dressing using gauze sponges, not tissue or cotton balls. Cleanser: Wound Cleanser Every Other Day/30 Days Discharge Instructions: Cleanse the wound with wound cleanser prior to applying a clean dressing using gauze sponges, not tissue or cotton balls. Prim  Dressing: KerraCel Ag Gelling Fiber Dressing, 2x2 in (silver alginate) (Generic) Every Other Day/30 Days ary Discharge Instructions: ****Ensure to apply into divot area of wound.**** Apply silver alginate to wound bed as instructed Secondary Dressing: Woven Gauze Sponge, Non-Sterile 4x4 in (DME) (Generic) Every Other Day/30 Days Discharge Instructions: Apply over primary dressing as directed. Secondary Dressing: Woven Gauze Sponges 2x2 in (DME) (Generic) Every Other Day/30 Days Discharge Instructions: Apply over primary dressing as directed. Secondary Dressing: Zetuvit Plus 4x8 in (Generic) Every Other Day/30 Days Discharge Instructions: Apply over primary dressing as directed. Secured With:  Elastic Bandage 4 inch (ACE bandage) Every Other Day/30 Days Discharge Instructions: Secure with ACE bandage as directed. Secured With: [employer], 4.5x3.1 (in/yd) (Generic) Every Other Day/30 Days Discharge Instructions: Secure with Kerlix as directed. 1. Both wound areas bilaterally look as though there closing down. Granulation looks healthy. No debridement is required. No evidence of infection. 2. He is wearing heel offloading boots and this has been satisfactory in terms of pressure relief. 3. He shows me he has left foot drop which apparently was because of common peroneal nerve compression somewhere in the fibular head. He does have some extension at the ankle and I have advised him to continue this. 4. Silver alginate appears to be doing a good job on the surface of this wound. Dimensions are down. No change to the dressing Electronic Signature(s) Signed: 04/08/2021 5:31:22 PM By: Linton Ham MD Entered By: Linton Ham on 04/08/2021 11:28:21 -------------------------------------------------------------------------------- SuperBill Details Patient Name: Date of Service: [name], [place]. 04/08/2021 Medical Record Number: [id number] Patient Account Number: [id number] Date of Birth/Sex: Treating RN: [birth year] (45 y.o. [name], [name] Primary Care Provider: [name] Other Clinician: Referring Provider: Treating Provider/Extender: [name] in Treatment: 38 Diagnosis Coding ICD-10 Codes Code Description [phone] Non-pressure chronic ulcer of other part of right foot with fat layer exposed L89.893 Pressure ulcer of other site, stage 3 L97.522 Non-pressure chronic ulcer of other 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 Facility Procedures CPT4 Code: [id number] Description: [place] VISIT-LEV 4 EST PT Modifier: Quantity: 1 Physician Procedures : CPT4 Code Description Modifier [id number] - WC PHYS LEVEL 3 - EST PT ICD-10  Diagnosis Description L97.512 Non-pressure chronic ulcer of other part of right foot with fat layer exposed L97.522 Non-pressure chronic ulcer of other part of left foot  with fat layer exposed L89.893 Pressure ulcer of other site, stage 3 Quantity: 1 Electronic Signature(s) Signed: 04/08/2021 5:19:32 PM By: Levan Hurst RN, BSN Signed: 04/08/2021 5:31:22 PM By: Linton Ham MD Entered By: Levan Hurst on 04/08/2021 11:56:03

## 2021-04-09 DIAGNOSIS — U071 COVID-19: Secondary | ICD-10-CM | POA: Diagnosis not present

## 2021-04-09 DIAGNOSIS — S91309A Unspecified open wound, unspecified foot, initial encounter: Secondary | ICD-10-CM | POA: Diagnosis not present

## 2021-04-09 DIAGNOSIS — G4734 Idiopathic sleep related nonobstructive alveolar hypoventilation: Secondary | ICD-10-CM | POA: Diagnosis not present

## 2021-04-14 DIAGNOSIS — I10 Essential (primary) hypertension: Secondary | ICD-10-CM | POA: Diagnosis not present

## 2021-04-14 DIAGNOSIS — Z23 Encounter for immunization: Secondary | ICD-10-CM | POA: Diagnosis not present

## 2021-04-14 DIAGNOSIS — Z0000 Encounter for general adult medical examination without abnormal findings: Secondary | ICD-10-CM | POA: Diagnosis not present

## 2021-04-14 DIAGNOSIS — Z1339 Encounter for screening examination for other mental health and behavioral disorders: Secondary | ICD-10-CM | POA: Diagnosis not present

## 2021-04-14 DIAGNOSIS — Z1331 Encounter for screening for depression: Secondary | ICD-10-CM | POA: Diagnosis not present

## 2021-04-14 DIAGNOSIS — F339 Major depressive disorder, recurrent, unspecified: Secondary | ICD-10-CM | POA: Diagnosis not present

## 2021-04-16 DIAGNOSIS — M6259 Muscle wasting and atrophy, not elsewhere classified, multiple sites: Secondary | ICD-10-CM | POA: Diagnosis not present

## 2021-04-16 DIAGNOSIS — G7281 Critical illness myopathy: Secondary | ICD-10-CM | POA: Diagnosis not present

## 2021-04-16 DIAGNOSIS — M6281 Muscle weakness (generalized): Secondary | ICD-10-CM | POA: Diagnosis not present

## 2021-04-16 DIAGNOSIS — Z741 Need for assistance with personal care: Secondary | ICD-10-CM | POA: Diagnosis not present

## 2021-04-19 ENCOUNTER — Telehealth: Payer: Self-pay | Admitting: *Deleted

## 2021-04-19 DIAGNOSIS — G894 Chronic pain syndrome: Secondary | ICD-10-CM

## 2021-04-19 NOTE — Telephone Encounter (Signed)
Mr Goettel called and reports he needs to speak with Dr Dagoberto Ligas to "discuss a few things with her". He says he was told to call her.

## 2021-04-22 ENCOUNTER — Encounter (HOSPITAL_BASED_OUTPATIENT_CLINIC_OR_DEPARTMENT_OTHER): Payer: BC Managed Care – PPO | Admitting: Internal Medicine

## 2021-04-22 ENCOUNTER — Other Ambulatory Visit: Payer: Self-pay

## 2021-04-22 DIAGNOSIS — L89613 Pressure ulcer of right heel, stage 3: Secondary | ICD-10-CM | POA: Diagnosis not present

## 2021-04-22 DIAGNOSIS — I10 Essential (primary) hypertension: Secondary | ICD-10-CM | POA: Diagnosis not present

## 2021-04-22 DIAGNOSIS — L97522 Non-pressure chronic ulcer of other 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: Secondary | ICD-10-CM | POA: Diagnosis not present

## 2021-04-22 DIAGNOSIS — L89623 Pressure ulcer of left heel, stage 3: Secondary | ICD-10-CM | POA: Diagnosis not present

## 2021-04-22 DIAGNOSIS — L97512 Non-pressure chronic ulcer of other part of right foot with fat layer exposed: Secondary | ICD-10-CM | POA: Diagnosis not present

## 2021-04-22 DIAGNOSIS — L89893 Pressure ulcer of other site, stage 3: Secondary | ICD-10-CM | POA: Diagnosis not present

## 2021-04-22 DIAGNOSIS — A499 Bacterial infection, unspecified: Secondary | ICD-10-CM | POA: Diagnosis not present

## 2021-04-22 NOTE — Progress Notes (Signed)
NASIRE, REALI ([id number]) Visit Report for 04/22/2021 Arrival Information Details Patient Name: Date of Service: [name], [name] 04/22/2021 10:00 A M Medical Record Number: [id number] Patient Account Number: [id number] Date of Birth/Sex: Treating RN: 01/24/1976 (45 y.o. [name] Primary Care [name]: [name] Other Clinician: Referring Ariana Juul: Treating Khylen Riolo/Extender: [name] in Treatment: 53 Visit Information History Since Last Visit Added or deleted any medications: No Patient Arrived: Wheel Chair Any new allergies or adverse reactions: No Arrival Time: 09:52 Had a fall or experienced change in No Accompanied By: spouse activities of daily living that may affect Transfer Assistance: None risk of falls: Patient Identification Verified: Yes Signs or symptoms of abuse/neglect since last visito No Secondary Verification Process Completed: Yes Hospitalized since last visit: No Patient Requires Transmission-Based Precautions: No Implantable device outside of the clinic excluding No Patient Has Alerts: No cellular tissue based products placed in the center since last visit: Has Dressing in Place as Prescribed: Yes Has Footwear/Offloading in Place as Prescribed: Yes Left: Other:globoped Right: Other:globoped Pain Present Now: No Electronic Signature(s) Signed: 04/22/2021 5:56:16 PM By: Baruch Gouty RN, BSN Entered By: Baruch Gouty on 04/22/2021 10:00:24 -------------------------------------------------------------------------------- Encounter Discharge Information Details Patient Name: Date of Service: [name], [name]. 04/22/2021 10:00 A M Medical Record Number: [id number] Patient Account Number: [id number] Date of Birth/Sex: Treating RN: 1975-09-27 (45 y.o. [name] Primary Care Rosaleigh Brazzel: [name] Other Clinician: Referring Kemar Pandit: Treating Lamis Behrmann/Extender: [name] in Treatment:  55 Encounter Discharge Information Items Post Procedure Vitals Discharge Condition: Stable Temperature (F): 98 Ambulatory Status: Wheelchair Pulse (bpm): 86 Discharge Destination: Home Respiratory Rate (breaths/min): 18 Transportation: Private Auto Blood Pressure (mmHg): 148/91 Accompanied By: self Schedule Follow-up Appointment: Yes Clinical Summary of Care: Electronic Signature(s) Signed: 04/22/2021 6:28:58 PM By: [name] RN, BSN Entered By: [name] on 04/22/2021 10:45:31 -------------------------------------------------------------------------------- Lower Extremity Assessment Details Patient Name: Date of Service: [name], [name] 04/22/2021 10:00 A M Medical Record Number: [id number] Patient Account Number: [id number] Date of Birth/Sex: Treating RN: 05-Aug-1976 (45 y.o. [name] Primary Care Sayra Frisby: [name] Other Clinician: Referring Rawleigh Rode: Treating Itzell Bendavid/Extender: [name] in Treatment: 40 Edema Assessment Assessed: [name]: No] [name]: No] Edema: [Left: No] [Right: No] Calf Left: Right: Point of Measurement: 29 cm From Medial Instep 42.5 cm 43.5 cm Ankle Left: Right: Point of Measurement: 9 cm From Medial Instep 25 cm 24.5 cm Vascular Assessment Pulses: Dorsalis Pedis Palpable: [Left:Yes] [Right:Yes] Electronic Signature(s) Signed: 04/22/2021 5:56:16 PM By: Baruch Gouty RN, BSN Entered By: Baruch Gouty on 04/22/2021 10:03:31 -------------------------------------------------------------------------------- Multi Wound Chart Details Patient Name: Date of Service: [name], [name] 04/22/2021 10:00 A M Medical Record Number: [id number] Patient Account Number: [id number] Date of Birth/Sex: Treating RN: 1976-08-01 (45 y.o. [name] Primary Care Vyom Brass: [name] Other Clinician: Referring Gabrianna Fassnacht: Treating Omarii Scalzo/Extender: [name] in Treatment: 40 Vital  Signs Height(in): 35 Pulse(bpm): 75 Weight(lbs): 67 Blood Pressure(mmHg): 148/91 Body Mass Index(BMI): 41 Temperature(F): 98.6 Respiratory Rate(breaths/min): 18 Photos: [1:Right Calcaneus] [2:Left Calcaneus] [N/A:N/A N/A] Wound Location: [1:Pressure Injury] [2:Pressure Injury] [N/A:N/A] Wounding Event: [1:Pressure Ulcer] [2:Pressure Ulcer] [N/A:N/A] Primary Etiology: [1:Asthma, Angina, Hypertension] [2:Asthma, Angina, Hypertension] [N/A:N/A] Comorbid History: [1:10/07/2019] [2:10/07/2019] [N/A:N/A] Date Acquired: [1:40] [2:40] [N/A:N/A] Weeks of Treatment: [1:Open] [2:Open] [N/A:N/A] Wound Status: [1:4.3x1.3x0.6] [2:3.9x1.3x0.4] [N/A:N/A] Measurements L x W x D (cm) [1:4.39] [2:3.982] [N/A:N/A] A (cm) : rea [1:2.634] [2:1.593] [N/A:N/A] Volume (cm) : [1:86.10%] [2:85.30%] [  N/A:N/A] % Reduction in A rea: [1:91.70%] [2:94.10%] [N/A:N/A] % Reduction in Volume: [2:8] Starting Position 1 (o'clock): [2:10] Ending Position 1 (o'clock): [2:0.3] Maximum Distance 1 (cm): [1:No] [2:Yes] [N/A:N/A] Undermining: [1:Category/Stage III] [2:Category/Stage III] [N/A:N/A] Classification: [1:Medium] [2:Medium] [N/A:N/A] Exudate A mount: [1:Serosanguineous] [2:Serosanguineous] [N/A:N/A] Exudate Type: [1:red, brown] [2:red, brown] [N/A:N/A] Exudate Color: [1:Thickened] [2:Thickened] [N/A:N/A] Wound Margin: [1:Large (67-100%)] [2:Medium (34-66%)] [N/A:N/A] Granulation A mount: [1:Red, Pink] [2:Red] [N/A:N/A] Granulation Quality: [1:Small (1-33%)] [2:Medium (34-66%)] [N/A:N/A] Necrotic A mount: [1:Fat Layer (Subcutaneous Tissue): Yes Fat Layer (Subcutaneous Tissue): Yes N/A] Exposed Structures: [1:Fascia: No Tendon: No Muscle: No Joint: No Bone: No Small (1-33%)] [2:Fascia: No Tendon: No Muscle: No Joint: No Bone: No Small (1-33%)] [N/A:N/A] Epithelialization: [1:Debridement - Excisional] [2:Debridement - Excisional] [N/A:N/A] Debridement: Pre-procedure Verification/Time Out 10:28 [2:10:28]  [N/A:N/A] Taken: [1:Lidocaine 4% Topical Solution] [2:Lidocaine 4% Topical Solution] [N/A:N/A] Pain Control: [1:Callus, Subcutaneous, Slough] [2:Callus, Subcutaneous, Slough] [N/A:N/A] Tissue Debrided: [1:Skin/Subcutaneous Tissue] [2:Skin/Subcutaneous Tissue] [N/A:N/A] Level: [1:9] [2:6] [N/A:N/A] Debridement A (sq cm): [1:rea Curette] [2:Curette] [N/A:N/A] Instrument: [1:Moderate] [2:Moderate] [N/A:N/A] Bleeding: [1:Pressure] [2:Pressure] [N/A:N/A] Hemostasis A chieved: [1:0] [2:0] [N/A:N/A] Procedural Pain: [1:1] [2:1] [N/A:N/A] Post Procedural Pain: [1:Procedure was tolerated well] [2:Procedure was tolerated well] [N/A:N/A] Debridement Treatment Response: [1:4.3x1.3x0.6] [2:3.9x1.3x0.4] [N/A:N/A] Post Debridement Measurements L x W x D (cm) [1:2.634] [2:1.593] [N/A:N/A] Post Debridement Volume: (cm) [1:Category/Stage III] [2:Category/Stage III] [N/A:N/A] Post Debridement Stage: [1:Debridement] [2:Debridement] [N/A:N/A] Treatment Notes Wound #1 (Calcaneus) Wound Laterality: Right Cleanser Normal Saline Discharge Instruction: Cleanse the wound with Normal Saline prior [name] applying a clean dressing using gauze sponges, not tissue or cotton balls. Wound Cleanser Discharge Instruction: Cleanse the wound with wound cleanser prior [name] applying a clean dressing using gauze sponges, not tissue or cotton balls. Peri-Wound Care Topical Primary Dressing Iodosorb Gel 10 (gm) Tube Discharge Instruction: Apply [name] wound bed as instructed Secondary Dressing Zetuvit Plus 4x8 in Discharge Instruction: Apply over primary dressing as directed. Secured With Elastic Bandage 4 inch (ACE bandage) Discharge Instruction: Secure with ACE bandage as directed. Kerlix Roll Sterile, 4.5x3.1 (in/yd) Discharge Instruction: Secure with Kerlix as directed. Compression Wrap Compression Stockings Add-Ons Wound #2 (Calcaneus) Wound Laterality: Left Cleanser Normal Saline Discharge Instruction: Cleanse the wound  with Normal Saline prior [name] applying a clean dressing using gauze sponges, not tissue or cotton balls. Wound Cleanser Discharge Instruction: Cleanse the wound with wound cleanser prior [name] applying a clean dressing using gauze sponges, not tissue or cotton balls. Peri-Wound Care Topical Primary Dressing Iodosorb Gel 10 (gm) Tube Discharge Instruction: Apply [name] wound bed as instructed Secondary Dressing Zetuvit Plus 4x8 in Discharge Instruction: Apply over primary dressing as directed. Secured With Elastic Bandage 4 inch (ACE bandage) Discharge Instruction: Secure with ACE bandage as directed. Kerlix Roll Sterile, 4.5x3.1 (in/yd) Discharge Instruction: Secure with Kerlix as directed. Compression Wrap Compression Stockings Add-Ons Electronic Signature(s) Signed: 04/22/2021 4:57:28 PM By: Linton Ham MD Signed: 04/22/2021 6:28:58 PM By: [name] RN, BSN Entered By: Linton Ham on 04/22/2021 10:45:01 -------------------------------------------------------------------------------- Multi-Disciplinary Care Plan Details Patient Name: Date of Service: [name], [name] 04/22/2021 10:00 A M Medical Record Number: [id number] Patient Account Number: [id number] Date of Birth/Sex: Treating RN: [birth year] (45 y.o. [name] Primary Care Ellicia Alix: [name] Other Clinician: Referring Allisha Harter: Treating Landen Knoedler/Extender: [name] in Treatment: 65 Multidisciplinary Care Plan reviewed with physician Active Inactive Abuse / Safety / Falls / Self Care Management Nursing Diagnoses: Potential for falls Goals: Patient/caregiver will verbalize/demonstrate measures taken [name] prevent injury and/or falls Date Initiated:  07/15/2020 Target Resolution Date: 06/03/2021 Goal Status: Active Interventions: Assess fall risk on admission and as needed Assess impairment of mobility on admission and as needed per policy Notes: Wound/Skin Impairment Nursing  Diagnoses: Impaired tissue integrity Knowledge deficit related [name] ulceration/compromised skin integrity Goals: Patient/caregiver will verbalize understanding of skin care regimen Date Initiated: 07/15/2020 Target Resolution Date: 06/03/2021 Goal Status: Active Ulcer/skin breakdown will have a volume reduction of 30% by week 4 Date Initiated: 07/15/2020 Date Inactivated: 08/20/2020 Target Resolution Date: 09/03/2020 Goal Status: Unmet Unmet Reason: no major changes. Ulcer/skin breakdown will heal within 14 weeks Date Initiated: 12/04/2020 Date Inactivated: 12/10/2020 Target Resolution Date: 12/10/2020 Unmet Reason: wounds still open at 14 Goal Status: Unmet weeks and today 21 weeks. Interventions: Assess patient/caregiver ability [name] obtain necessary supplies Assess patient/caregiver ability [name] perform ulcer/skin care regimen upon admission and as needed Assess ulceration(s) every visit Provide education on ulcer and skin care Treatment Activities: Skin care regimen initiated : 07/15/2020 Topical wound management initiated : 07/15/2020 Notes: Electronic Signature(s) Signed: 04/22/2021 6:28:58 PM By: [name] RN, BSN Entered By: [name] on 04/22/2021 10:22:32 -------------------------------------------------------------------------------- Pain Assessment Details Patient Name: Date of Service: [name], [name] E. 04/22/2021 10:00 A M Medical Record Number: [id number] Patient Account Number: [id number] Date of Birth/Sex: Treating RN: Dec 24, 1975 (45 y.o. [name] Primary Care Vivion Romano: [name] Other Clinician: Referring Kasarah Sitts: Treating Jalynn Betzold/Extender: [name] in Treatment: 40 Active Problems Location of Pain Severity and Description of Pain Patient Has Paino No Site Locations With Dressing Change: Yes Duration of the Pain. Constant / Intermittento Intermittent Rate the pain. Current Pain Level: 0 Character of Pain Describe  the Pain: Tender, Other: sore Pain Management and Medication Current Pain Management: Medication: Yes Is the Current Pain Management Adequate: Adequate Rest: Yes How does your wound impact your activities of daily livingo Sleep: No Bathing: No Appetite: No Relationship With Others: No Bladder Continence: No Emotions: No Bowel Continence: No Work: No Toileting: No Drive: No Dressing: No Hobbies: No Electronic Signature(s) Signed: 04/22/2021 5:56:16 PM By: Baruch Gouty RN, BSN Entered By: Baruch Gouty on 04/22/2021 10:01:42 -------------------------------------------------------------------------------- Patient/Caregiver Education Details Patient Name: Date of Service: [name], [name] 9/15/2022andnbsp10:00 A M Medical Record Number: [id number] Patient Account Number: [id number] Date of Birth/Gender: Treating RN: 05/10/1976 (45 y.o. [name] Primary Care Physician: [name] Other Clinician: Referring Physician: Treating Physician/Extender: [name] in Treatment: 3 Education Assessment Education Provided [name]: Patient Education Topics Provided Wound/Skin Impairment: Handouts: Skin Care Do's and Dont's Methods: Explain/Verbal Responses: Reinforcements needed Electronic Signature(s) Signed: 04/22/2021 6:28:58 PM By: [name] RN, BSN Entered By: [name] on 04/22/2021 10:22:43 -------------------------------------------------------------------------------- Wound Assessment Details Patient Name: Date of Service: [name], [name] 04/22/2021 10:00 A M Medical Record Number: [id number] Patient Account Number: [id number] Date of Birth/Sex: Treating RN: 08-24-1975 (45 y.o. [name] Primary Care Tavaughn Silguero: [name] Other Clinician: Referring Octavion Mollenkopf: Treating Mackinzie Vuncannon/Extender: [name] in Treatment: 40 Wound Status Wound Number: 1 Primary Etiology: Pressure Ulcer Wound Location:  Right Calcaneus Wound Status: Open Wounding Event: Pressure Injury Comorbid History: Asthma, Angina, Hypertension Date Acquired: 10/07/2019 Weeks Of Treatment: 40 Clustered Wound: No Photos Wound Measurements Length: (cm) 4.3 % Re Width: (cm) 1.3 % Re Depth: (cm) 0.6 Epit Area: (cm) 4.39 Tun Volume: (cm) 2.634 Und duction in Area: 86.1% duction in Volume: 91.7% helialization: Small (1-33%) neling: No ermining: No Wound Description Classification: Category/Stage III Fou Wound Margin:  Thickened Slo Exudate Amount: Medium Exudate Type: Serosanguineous Exudate Color: red, brown l Odor After Cleansing: No ugh/Fibrino No Wound Bed Granulation Amount: Large (67-100%) Exposed Structure Granulation Quality: Red, Pink Fascia Exposed: No Necrotic Amount: Small (1-33%) Fat Layer (Subcutaneous Tissue) Exposed: Yes Necrotic Quality: Adherent Slough Tendon Exposed: No Muscle Exposed: No Joint Exposed: No Bone Exposed: No Treatment Notes Wound #1 (Calcaneus) Wound Laterality: Right Cleanser Normal Saline Discharge Instruction: Cleanse the wound with Normal Saline prior [name] applying a clean dressing using gauze sponges, not tissue or cotton balls. Wound Cleanser Discharge Instruction: Cleanse the wound with wound cleanser prior [name] applying a clean dressing using gauze sponges, not tissue or cotton balls. Peri-Wound Care Topical Primary Dressing Iodosorb Gel 10 (gm) Tube Discharge Instruction: Apply [name] wound bed as instructed Secondary Dressing Zetuvit Plus 4x8 in Discharge Instruction: Apply over primary dressing as directed. Secured With Elastic Bandage 4 inch (ACE bandage) Discharge Instruction: Secure with ACE bandage as directed. Kerlix Roll Sterile, 4.5x3.1 (in/yd) Discharge Instruction: Secure with Kerlix as directed. Compression Wrap Compression Stockings Add-Ons Electronic Signature(s) Signed: 04/22/2021 5:56:16 PM By: Baruch Gouty RN, BSN Signed: 04/22/2021  6:28:58 PM By: [name] RN, BSN Entered By: [name] on 04/22/2021 11:03:55 -------------------------------------------------------------------------------- Wound Assessment Details Patient Name: Date of Service: [name], [name] 04/22/2021 10:00 A M Medical Record Number: [id number] Patient Account Number: [id number] Date of Birth/Sex: Treating RN: 08-26-1975 (45 y.o. [name] Primary Care Aryan Sparks: [name] Other Clinician: Referring Gloristine Turrubiates: Treating Nikitha Mode/Extender: [name] in Treatment: 40 Wound Status Wound Number: 2 Primary Etiology: Pressure Ulcer Wound Location: Left Calcaneus Wound Status: Open Wounding Event: Pressure Injury Comorbid History: Asthma, Angina, Hypertension Date Acquired: 10/07/2019 Weeks Of Treatment: 40 Clustered Wound: No Photos Wound Measurements Length: (cm) 3.9 Width: (cm) 1.3 Depth: (cm) 0.4 Area: (cm) 3.982 Volume: (cm) 1.593 % Reduction in Area: 85.3% % Reduction in Volume: 94.1% Epithelialization: Small (1-33%) Tunneling: No Undermining: Yes Starting Position (o'clock): 8 Ending Position (o'clock): 10 Maximum Distance: (cm) 0.3 Wound Description Classification: Category/Stage III Wound Margin: Thickened Exudate Amount: Medium Exudate Type: Serosanguineous Exudate Color: red, brown Foul Odor After Cleansing: No Slough/Fibrino No Wound Bed Granulation Amount: Medium (34-66%) Exposed Structure Granulation Quality: Red Fascia Exposed: No Necrotic Amount: Medium (34-66%) Fat Layer (Subcutaneous Tissue) Exposed: Yes Necrotic Quality: Adherent Slough Tendon Exposed: No Muscle Exposed: No Joint Exposed: No Bone Exposed: No Treatment Notes Wound #2 (Calcaneus) Wound Laterality: Left Cleanser Normal Saline Discharge Instruction: Cleanse the wound with Normal Saline prior [name] applying a clean dressing using gauze sponges, not tissue or cotton balls. Wound Cleanser Discharge  Instruction: Cleanse the wound with wound cleanser prior [name] applying a clean dressing using gauze sponges, not tissue or cotton balls. Peri-Wound Care Topical Primary Dressing Iodosorb Gel 10 (gm) Tube Discharge Instruction: Apply [name] wound bed as instructed Secondary Dressing Zetuvit Plus 4x8 in Discharge Instruction: Apply over primary dressing as directed. Secured With Elastic Bandage 4 inch (ACE bandage) Discharge Instruction: Secure with ACE bandage as directed. Kerlix Roll Sterile, 4.5x3.1 (in/yd) Discharge Instruction: Secure with Kerlix as directed. Compression Wrap Compression Stockings Add-Ons Electronic Signature(s) Signed: 04/22/2021 5:56:16 PM By: Baruch Gouty RN, BSN Entered By: Baruch Gouty on 04/22/2021 10:24:38 -------------------------------------------------------------------------------- Vitals Details Patient Name: Date of Service: [name], [name] 04/22/2021 10:00 A M Medical Record Number: [id number] Patient Account Number: [id number] Date of Birth/Sex: Treating RN: 16-Jul-1976 (45 y.o. [name] Primary Care Zoila Ditullio: [name] Other Clinician: Referring Aryani Daffern: Treating  Talon Regala/Extender: [name] in Treatment: 40 Vital Signs Time Taken: 10:00 Temperature (F): 98.6 Height (in): 69 Pulse (bpm): 86 Source: Stated Respiratory Rate (breaths/min): 18 Weight (lbs): 280 Blood Pressure (mmHg): 148/91 Source: Stated Reference Range: 80 - 120 mg / dl Body Mass Index (BMI): 41.3 Electronic Signature(s) Signed: 04/22/2021 5:56:16 PM By: Baruch Gouty RN, BSN Entered By: Baruch Gouty on 04/22/2021 10:01:02

## 2021-04-23 DIAGNOSIS — S91309A Unspecified open wound, unspecified foot, initial encounter: Secondary | ICD-10-CM | POA: Diagnosis not present

## 2021-04-26 DIAGNOSIS — Z1211 Encounter for screening for malignant neoplasm of colon: Secondary | ICD-10-CM | POA: Diagnosis not present

## 2021-04-26 DIAGNOSIS — Z1212 Encounter for screening for malignant neoplasm of rectum: Secondary | ICD-10-CM | POA: Diagnosis not present

## 2021-04-27 NOTE — Progress Notes (Signed)
[name] ([id number]) Visit Report for 04/22/2021 Debridement Details Patient Name: Date of Service: [name], [name] 04/22/2021 10:00 A M Medical Record Number: [id number] Patient Account Number: [id number] Date of Birth/Sex: Treating RN: 04-11-1976 (45 y.o. [name] Primary Care Provider: Cristie [name] Other Clinician: Referring Provider: Treating Provider/Extender: [name] in Treatment: 40 Debridement Performed for Assessment: Wound #1 Right Calcaneus Performed By: Physician [name], MD Debridement Type: Debridement Level of Consciousness (Pre-procedure): Awake and Alert Pre-procedure Verification/Time Out Yes - 10:28 Taken: Start Time: 10:29 Pain Control: Lidocaine 4% T opical Solution T Area Debrided (L x W): otal 4.5 (cm) x 2 (cm) = 9 (cm) Tissue and other material debrided: Viable, Non-Viable, Callus, Slough, Subcutaneous, Skin: Dermis , Skin: Epidermis, Fibrin/Exudate, Slough Level: Skin/Subcutaneous Tissue Debridement Description: Excisional Instrument: Curette Bleeding: Moderate Hemostasis Achieved: Pressure End Time: 10:34 Procedural Pain: 0 Post Procedural Pain: 1 Response to Treatment: Procedure was tolerated well Level of Consciousness (Post- Awake and Alert procedure): Post Debridement Measurements of Total Wound Length: (cm) 4.3 Stage: Category/Stage III Width: (cm) 1.3 Depth: (cm) 0.6 Volume: (cm) 2.634 Character of Wound/Ulcer Post Debridement: Improved Post Procedure Diagnosis Same as Pre-procedure Electronic Signature(s) Signed: 04/22/2021 4:57:28 PM By: [name] Ham MD Signed: 04/22/2021 6:28:58 PM By: [name] Pilling RN, BSN Entered By: [name] on 04/22/2021 10:45:22 -------------------------------------------------------------------------------- Debridement Details Patient Name: Date of Service: [name], [name] 04/22/2021 10:00 A M Medical Record Number: [id number] Patient Account  Number: [id number] Date of Birth/Sex: Treating RN: [birth year] (45 y.o. [name] Primary Care Provider: Cristie [name] Other Clinician: Referring Provider: Treating Provider/Extender: [name] in Treatment: 40 Debridement Performed for Assessment: Wound #2 Left Calcaneus Performed By: Physician [name], MD Debridement Type: Debridement Level of Consciousness (Pre-procedure): Awake and Alert Pre-procedure Verification/Time Out Yes - 10:28 Taken: Start Time: 10:29 Pain Control: Lidocaine 4% T opical Solution T Area Debrided (L x W): otal 4 (cm) x 1.5 (cm) = 6 (cm) Tissue and other material debrided: Viable, Non-Viable, Callus, Slough, Subcutaneous, Skin: Dermis , Skin: Epidermis, Fibrin/Exudate, Slough Level: Skin/Subcutaneous Tissue Debridement Description: Excisional Instrument: Curette Bleeding: Moderate Hemostasis Achieved: Pressure End Time: 10:34 Procedural Pain: 0 Post Procedural Pain: 1 Response to Treatment: Procedure was tolerated well Level of Consciousness (Post- Awake and Alert procedure): Post Debridement Measurements of Total Wound Length: (cm) 3.9 Stage: Category/Stage III Width: (cm) 1.3 Depth: (cm) 0.4 Volume: (cm) 1.593 Character of Wound/Ulcer Post Debridement: Improved Post Procedure Diagnosis Same as Pre-procedure Electronic Signature(s) Signed: 04/22/2021 4:57:28 PM By: [name] Ham MD Signed: 04/22/2021 6:28:58 PM By: [name] Pilling RN, BSN Entered By: [name] on 04/22/2021 10:45:39 -------------------------------------------------------------------------------- HPI Details Patient Name: Date of Service: [name], [name] 04/22/2021 10:00 A M Medical Record Number: [id number] Patient Account Number: [id number] Date of Birth/Sex: Treating RN: Nov 22, 1975 (45 y.o. [name] Primary Care Provider: Cristie [name] Other Clinician: Referring Provider: Treating Provider/Extender: [name] in Treatment: 51 History of Present Illness HPI Description: Wounds are12/03/2020 upon evaluation today patient presents for initial inspection here in our clinic concerning issues he has been having with the bottoms of his feet bilaterally. He states these actually occurred as wounds when he was hospitalized for 5 months secondary to Covid. He was apparently with tilting bed where he was in an upright position quite frequently and apparently this occurred in some way shape or form during that time. Fortunately there is no sign of  active infection at this time. No fevers, chills, nausea, vomiting, or diarrhea. With that being said he still has substantial wounds on the plantar aspects of his feet Theragen require quite a bit of work to get these to heal. He has been using Santyl currently though that is been problematic both in receiving the medication as well as actually paid for it as it is become quite expensive. Prior to the experience with Covid the patient really did not have any major medical problems other than hypertension he does have some mild generalized weakness following the Covid experience. 07/22/2020 on evaluation today patient appears to be doing okay in regard to his foot ulcers I feel like the wound beds are showing signs of better improvement that I do believe the Iodoflex is helping in this regard. With that being said he does have a lot of drainage currently and this is somewhat blue/green in nature which is consistent with Pseudomonas. I do think a culture today would be appropriate for [place] to evaluate and see if that is indeed the case I would likely start him on antibiotic orally as well he is not allergic to Cipro knows of no issues he has had in the past 12/21; patient was admitted to the clinic earlier this month with bilateral presumed pressure ulcers on the bottom of his feet apparently related to excessive pressure from a tilt table arrangement  in the intensive care unit. Patient relates this to being on ECMO but I am not really sure that is exactly related to that. I must say I have never seen anything like this. He has fairly extensive full-thickness wounds extending from his heel towards his midfoot mostly centered laterally. There is already been some healing distally. He does not appear to have an arterial issue. He has been using gentamicin to the wound surfaces with Iodoflex to help with ongoing debridement 1/6; this is a patient with pressure ulcers on the bottom of his feet related to excessive pressure from a standing position in the intensive care unit. He is complaining of a lot of pain in the right heel. He is not a diabetic. 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 We have been using Iodoflex to help prepare the surfaces of both wounds for an advanced treatment product. He is nonambulatory spending most of his time in a wheelchair I have asked him not to propel the wheelchair with his heels 1/13; in general his wounds look better not much surface area change we have been using Iodoflex as of last week. I did an x-ray of the right heel as the patient was complaining of pain. I had some thoughts about a stress fracture perhaps Achilles tendon problems however what it showed was erosive changes along the inferior aspect of the calcaneus he now has a MRI booked for 1/20. 1/20; in general his wounds continue to be better. Some improvement in the large narrow areas proximally in his foot. He is still complaining of pain in the right heel and tenderness in certain areas of this wound. His MRI is tonight. I am not just looking for osteomyelitis that was brought up on the x-ray I am wondering about stress fractures, tendon ruptures etc. He has no such findings on the left. Also noteworthy is that the patient had critical illness neuropathy and some of the discomfort may be actual improvement in nerve function I  am just not sure. These wounds were initially in the setting of severe critical illness  related to COVID-19. He was put in a standing position. He may have also been on pressors at the point contributing to tissue ischemia. By his description at some point these wounds were grossly necrotic extending proximally up into the Achilles part of his heel. I do not know that I have ever really seen pictures of them like this although they may exist in epic We have ordered Tri layer Oasis. I am trying to stimulate some granulation in these areas. This is of course assuming the MRI is negative for infection 1/27; since the patient was last here he saw Dr. Juleen China of infectious disease. He is planned for vancomycin and ceftriaxone. Prior operative culture grew MSSA. Also ordered baseline lab work. He also ordered arterial studies although the ABIs in our clinic were normal as well as his clinical exam these were normal I do not think he needs to see vascular surgery. His ABIs at the [facility] were 1.22 in the right triphasic waveforms with a normal TBI of 1.15 on the left ABI of 1.22 with triphasic waveforms and a normal TBI of 1.08. Finally he saw Dr. Amalia Hailey who will follow him in for 2 months. At this point I do not think he felt that he needed a procedure on the right calcaneal bone. Dr. Juleen China is elected for broad-spectrum antibiotic The patient is still having pain in the right heel. He walks with a walker 2/3; wounds are generally smaller. He is tolerating his IV antibiotics. I believe this is vancomycin and ceftriaxone. We are still waiting for Oasis burn in terms of his out-of-pocket max which he should be meeting soon given the IV antibiotics, MRIs etc. I have asked him to check in on this. We are using silver collagen in the meantime the wounds look better 2/10; tolerating IV vancomycin and Rocephin. We are waiting to apply for Oasis. Although I am not really sure where he is in his out-of-pocket  max. 2/17 started the first application of Oasis trilayer. Still on antibiotics. The wounds have generally look better. The area on the left has a little more surface slough requiring debridement 0/03; second application of Oasis trilayer. The wound surface granulation is generally look better. The area on the left with undermining laterally I think is come in a bit. 10/08/2020 upon evaluation today patient is here today for [employer] application #3. Fortunately he seems to be doing extremely well with regard to this and we are seeing a lot of new epithelial growth which is great news. Fortunately there is no signs of active infection at this time. 10/16/2020 upon evaluation today patient appears to be doing well with regard to his foot ulcers. Do believe the Oasis has been of benefit for him. I do not see any signs of infection right now which is great news and I think that he has a lot of new epithelial growth which is great to see as well. The patient is very pleased to hear all of this. I do think we can proceed with the Oasis trilayer #4 today. 3/18; not as much improvement in these areas on his heels that I was hoping. I did reapply trilateral Oasis today the tissue looks healthier but not as much fill in as I was hoping. 3/25; better looking today I think this is come in a bit the tissue looks healthier. Triple layer Oasis reapplied #6 4/1; somewhat better looking definitely better looking surface not as much change in surface area as I was hoping. He  may be spending more time [place] on days then he needs to although he does have heel offloading boots. Triple layer Oasis reapplied #7 4/7; unfortunately apparently [facility] will not approve any further Oasis which is unfortunate since the patient did respond nicely both in terms of the condition of the wound bed as well as surface area. There is still some drainage coming from the wound but not a lot there does not appear to be  any infection 4/15; we have been using Hydrofera Blue. He continues to have nice rims of epithelialization on the right greater than the left. The left the epithelialization is coming from the tip of his heel. There is moderate drainage. In this that concerns me about a total contact cast. There is no evidence of infection 4/29; patient has been using Hydrofera Blue with dressing changes. He has no complaints or issues today. 5/5; using Hydrofera Blue. I actually think that he looks marginally better than the last time I saw this 3 weeks ago. There are rims of epithelialization on the left thumb coming from the medial side on the right. Using Hydrofera Blue 5/12; using Hydrofera Blue. These continue to make improvements in surface area. His drainage was not listed as severe I therefore went ahead and put a cast on the left foot. Right foot we will continue to dress his previous 5/16; back for first total contact cast change. He did not tolerate this particularly well cast injury on the anterior tibia among other issues. Difficulty sleeping. I talked him about this in some detail and afterwards is elected to continue. I told him I would like to have a cast on for 3 weeks to see if this is going to help at all. I think he agreed 5/19; I think the wound is better. There is no tunneling towards his midfoot. The undermining medially also looks better. He has a rim of new skin distally. I think we are making progress here. The area on the left also continues to look somewhat better to me using Hydrofera Blue. He has a list of complaints about the cast but none of them seem serious 5/26; patient presents for 1 week follow-up. He has been using a total contact cast and tolerating this well. Hydrofera Blue is the main dressing used. He denies signs of infection. 6/2 Hydrofera Blue total contact cast on the left. These were large ulcers that formed in intensive care unit where the patient was recovering from  [place]. May have had something to do with being ventilated in an upright positiono Pressors etc. We have been able to get the areas down considerably and a viable surface. There is some epithelialization in both sides. Note made of drainage 6/9; changed to [facility] last time because of drainage. He arrives with better looking surfaces and dimensions on the left than the right. Paradoxically the right actually probes more towards his midfoot the left is largely close down but both of these look improved. Using a total contact cast on the left 6/16; complex wounds on his bilateral plantar heels which were initially pressure injury from a stay in the ICU with COVID. We have been using silver alginate most recently. His dimensions of come in quite dramatically however not recently. We have been putting the left foot in a total contact cast 6/23; complex wounds on the bilateral plantar heels. I been putting the left in the cast paradoxically the area on the right is the one that is going  towards closure at a faster rate. Quite a bit of drainage on the left. The patient went to see Dr. Amalia Hailey who said he was going to standby for skin grafts. I had actually considered sending him for skin grafts however he would be mandatorily off his feet for a period of weeks to months. I am thinking that the area on the right is going to close on its own the area on the left has been more stubborn even though we have him in a total contact cast 6/30; took him out of a total contact cast last week is the right heel seem to be making better progress than the left where I was placing the cast. We are using silver alginate. Both wounds are smaller right greater than left 7/12; both wounds look as though they are making some progress. We are using silver alginate. Heel offloading boots 7/26; very gradual progress especially on the right. Using silver alginate. He is wearing heel offloading boots 8/18; he continues to  close these wounds down very gradually. Using silver alginate. The problem polymen being definitive about this is areas of what appears to be callus around the margins. This is not a 100% of the area but certainly sizable especially on the right 9/1; bilateral plantar feet wounds secondary to prolonged pressure while being ventilated for COVID-19 in an upright position. Essentially pressure ulcers on the bottom of his feet. He is made substantial progress using silver alginate. 9/14; bilateral plantar feet wounds secondary to prolonged pressure. Making progress using silver alginate. Electronic Signature(s) Signed: 04/22/2021 4:57:28 PM By: [name] Ham MD Entered By: [name] on 04/22/2021 10:46:32 -------------------------------------------------------------------------------- Physical Exam Details Patient Name: Date of Service: [name], [place] 04/22/2021 10:00 A M Medical Record Number: [id number] Patient Account Number: [id number] Date of Birth/Sex: Treating RN: 10-03-1975 (45 y.o. [name] Primary Care Provider: Cristie [name] Other Clinician: Referring Provider: Treating Provider/Extender: [name] in Treatment: 79 Constitutional Patient is hypertensive.. Pulse regular and within target range for patient.Marland Kitchen Respirations regular, non-labored and within target range.. Temperature is normal and within the target range for the patient.Marland Kitchen Appears in no distress. Notes Wound exam; bilateral plantar heels. On the right there is a linear area extending towards the toes. I used a #5 curette in conjunction with MolecuLight to try and identify bacterial loads however I also removed surface debris from the entirety of the wounds. Hemostasis with direct pressure Electronic Signature(s) Signed: 04/22/2021 4:57:28 PM By: [name] Ham MD Entered By: [name] on 04/22/2021  10:48:35 -------------------------------------------------------------------------------- Physician Orders Details Patient Name: Date of Service: [name], [place] 04/22/2021 10:00 A M Medical Record Number: [id number] Patient Account Number: [id number] Date of Birth/Sex: Treating RN: Feb 20, 1976 (45 y.o. [name], [name] Primary Care Provider: Cristie [name] Other Clinician: Referring Provider: Treating Provider/Extender: [name] in Treatment: 45 Verbal / Phone Orders: No Diagnosis Coding ICD-10 Coding Code Description [phone] Non-pressure chronic ulcer of other part of right foot with fat layer exposed L89.893 Pressure ulcer of other site, stage 3 L97.522 Non-pressure chronic ulcer of other 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 Follow-up Appointments ppointment in 2 weeks. - with Dr. 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 Edema Control - Lymphedema / SCD / Other Bilateral Lower Extremities Elevate legs to the level of the heart or above for 30 minutes daily and/or when sitting, a frequency of: - throughout the day Avoid standing for long periods of time. Moisturize legs daily. - right leg and foot every night. Off-Loading Wedge shoe to: - Glophed Shoe to both feet. Other: - keep pressure off of the bottom of your feet Additional Orders / Instructions Follow Nutritious Diet Wound Treatment Wound #1 - Calcaneus Wound Laterality: Right Cleanser: Normal Saline (Generic) Every Other Day/30 Days Discharge Instructions: Cleanse the wound with Normal Saline prior to applying a clean dressing using gauze sponges, not tissue or cotton balls. Cleanser:  Wound Cleanser Every Other Day/30 Days Discharge Instructions: Cleanse the wound with wound cleanser prior to applying a clean dressing using gauze sponges, not tissue or cotton balls. Prim Dressing: Iodosorb Gel 10 (gm) Tube (DME) (Generic) Every Other Day/30 Days ary Discharge Instructions: Apply to wound bed as instructed Secondary Dressing: Zetuvit Plus 4x8 in (Generic) Every Other Day/30 Days Discharge Instructions: Apply over primary dressing as directed. Secured With: Elastic Bandage 4 inch (ACE bandage) Every Other Day/30 Days Discharge Instructions: Secure with ACE bandage as directed. Secured With: [employer], 4.5x3.1 (in/yd) (Generic) Every Other Day/30 Days Discharge Instructions: Secure with Kerlix as directed. Wound #2 - Calcaneus Wound Laterality: Left Cleanser: Normal Saline (Generic) Every Other Day/30 Days Discharge Instructions: Cleanse the wound with Normal Saline prior to applying a clean dressing using gauze sponges, not tissue or cotton balls. Cleanser: Wound Cleanser Every Other Day/30 Days Discharge Instructions: Cleanse the wound with wound cleanser prior to applying a clean dressing using gauze sponges, not tissue or cotton balls. Prim Dressing: Iodosorb Gel 10 (gm) Tube (DME) (Generic) Every Other Day/30 Days ary Discharge Instructions: Apply to wound bed as instructed Secondary Dressing: Zetuvit Plus 4x8 in (Generic) Every Other Day/30 Days Discharge Instructions: Apply over primary dressing as directed. Secured With: Elastic Bandage 4 inch (ACE bandage) Every Other Day/30 Days Discharge Instructions: Secure with ACE bandage as directed. Secured With: [employer], 4.5x3.1 (in/yd) (Generic) Every Other Day/30 Days Discharge Instructions: Secure with Kerlix as directed. Electronic Signature(s) Signed: 04/22/2021 4:57:28 PM By: [name] Ham MD Signed: 04/22/2021 6:28:58 PM By: [name] Pilling RN, BSN Entered By: [name] on 04/22/2021  10:43:26 -------------------------------------------------------------------------------- Problem List Details Patient Name: Date of Service: [name], [place] 04/22/2021 10:00 A M Medical Record Number: [id number] Patient Account Number: [id number] Date of Birth/Sex: Treating RN: 09/20/1975 (45 y.o. [name] Primary Care Provider: Cristie [name] Other Clinician: Referring Provider: Treating Provider/Extender: [name] in Treatment: 72 Active Problems ICD-10 Encounter Code Description Active Date MDM Diagnosis [phone] Non-pressure chronic ulcer of other part of right foot with fat layer exposed 09/03/2020 No Yes L89.893 Pressure ulcer of other site, stage 3 07/15/2020 No Yes L97.522 Non-pressure chronic ulcer of other 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 09/03/2020 No Yes Inactive Problems ICD-10 Code Description Active Date Inactive Date M62.81 Muscle weakness (generalized) 07/15/2020 07/15/2020 I10 Essential (primary) hypertension 07/15/2020 07/15/2020 M86.171 Other acute osteomyelitis, right ankle and foot 09/03/2020 09/03/2020 Resolved Problems Electronic Signature(s) Signed: 04/22/2021 4:57:28 PM By: [name] Ham MD Entered By: [name] on 04/22/2021 10:44:43 -------------------------------------------------------------------------------- Progress Note Details Patient Name: Date of Service: [name], [place] 04/22/2021 10:00 A M Medical Record Number: [id number] Patient Account Number: [id number] Date of Birth/Sex: Treating RN: [birth year] (  45 y.o. [name] Primary Care Provider: Cristie [name] Other Clinician: Referring Provider: Treating Provider/Extender: [name] in Treatment: 40 Subjective History of Present Illness (HPI) Wounds are12/03/2020 upon evaluation today patient presents for initial inspection here in our clinic concerning issues he has been having with the bottoms of his feet bilaterally.  He states these actually occurred as wounds when he was hospitalized for 5 months secondary to Covid. He was apparently with tilting bed where he was in an upright position quite frequently and apparently this occurred in some way shape or form during that time. Fortunately there is no sign of active infection at this time. No fevers, chills, nausea, vomiting, or diarrhea. With that being said he still has substantial wounds on the plantar aspects of his feet Theragen require quite a bit of work to get these to heal. He has been using Santyl currently though that is been problematic both in receiving the medication as well as actually paid for it as it is become quite expensive. Prior to the experience with Covid the patient really did not have any major medical problems other than hypertension he does have some mild generalized weakness following the Covid experience. 07/22/2020 on evaluation today patient appears to be doing okay in regard to his foot ulcers I feel like the wound beds are showing signs of better improvement that I do believe the Iodoflex is helping in this regard. With that being said he does have a lot of drainage currently and this is somewhat blue/green in nature which is consistent with Pseudomonas. I do think a culture today would be appropriate for [place] to evaluate and see if that is indeed the case I would likely start him on antibiotic orally as well he is not allergic to Cipro knows of no issues he has had in the past 12/21; patient was admitted to the clinic earlier this month with bilateral presumed pressure ulcers on the bottom of his feet apparently related to excessive pressure from a tilt table arrangement in the intensive care unit. Patient relates this to being on ECMO but I am not really sure that is exactly related to that. I must say I have never seen anything like this. He has fairly extensive full-thickness wounds extending from his heel towards his midfoot mostly  centered laterally. There is already been some healing distally. He does not appear to have an arterial issue. He has been using gentamicin to the wound surfaces with Iodoflex to help with ongoing debridement 1/6; this is a patient with pressure ulcers on the bottom of his feet related to excessive pressure from a standing position in the intensive care unit. He is complaining of a lot of pain in the right heel. He is not a diabetic. 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 We have been using Iodoflex to help prepare the surfaces of both wounds for an advanced treatment product. He is nonambulatory spending most of his time in a wheelchair I have asked him not to propel the wheelchair with his heels 1/13; in general his wounds look better not much surface area change we have been using Iodoflex as of last week. I did an x-ray of the right heel as the patient was complaining of pain. I had some thoughts about a stress fracture perhaps Achilles tendon problems however what it showed was erosive changes along the inferior aspect of the calcaneus he now has a MRI booked for 1/20. 1/20; in general  his wounds continue to be better. Some improvement in the large narrow areas proximally in his foot. He is still complaining of pain in the right heel and tenderness in certain areas of this wound. His MRI is tonight. I am not just looking for osteomyelitis that was brought up on the x-ray I am wondering about stress fractures, tendon ruptures etc. He has no such findings on the left. Also noteworthy is that the patient had critical illness neuropathy and some of the discomfort may be actual improvement in nerve function I am just not sure. These wounds were initially in the setting of severe critical illness related to COVID-19. He was put in a standing position. He may have also been on pressors at the point contributing to tissue ischemia. By his description at some point these wounds  were grossly necrotic extending proximally up into the Achilles part of his heel. I do not know that I have ever really seen pictures of them like this although they may exist in epic We have ordered Tri layer Oasis. I am trying to stimulate some granulation in these areas. This is of course assuming the MRI is negative for infection 1/27; since the patient was last here he saw Dr. Juleen China of infectious disease. He is planned for vancomycin and ceftriaxone. Prior operative culture grew MSSA. Also ordered baseline lab work. He also ordered arterial studies although the ABIs in our clinic were normal as well as his clinical exam these were normal I do not think he needs to see vascular surgery. His ABIs at the [facility] were 1.22 in the right triphasic waveforms with a normal TBI of 1.15 on the left ABI of 1.22 with triphasic waveforms and a normal TBI of 1.08. Finally he saw Dr. Amalia Hailey who will follow him in for 2 months. At this point I do not think he felt that he needed a procedure on the right calcaneal bone. Dr. Juleen China is elected for broad-spectrum antibiotic The patient is still having pain in the right heel. He walks with a walker 2/3; wounds are generally smaller. He is tolerating his IV antibiotics. I believe this is vancomycin and ceftriaxone. We are still waiting for Oasis burn in terms of his out-of-pocket max which he should be meeting soon given the IV antibiotics, MRIs etc. I have asked him to check in on this. We are using silver collagen in the meantime the wounds look better 2/10; tolerating IV vancomycin and Rocephin. We are waiting to apply for Oasis. Although I am not really sure where he is in his out-of-pocket max. 2/17 started the first application of Oasis trilayer. Still on antibiotics. The wounds have generally look better. The area on the left has a little more surface slough requiring debridement 2/50; second application of Oasis trilayer. The wound surface granulation is  generally look better. The area on the left with undermining laterally I think is come in a bit. 10/08/2020 upon evaluation today patient is here today for [employer] application #3. Fortunately he seems to be doing extremely well with regard to this and we are seeing a lot of new epithelial growth which is great news. Fortunately there is no signs of active infection at this time. 10/16/2020 upon evaluation today patient appears to be doing well with regard to his foot ulcers. Do believe the Oasis has been of benefit for him. I do not see any signs of infection right now which is great news and I think that he has a lot  of new epithelial growth which is great to see as well. The patient is very pleased to hear all of this. I do think we can proceed with the Oasis trilayer #4 today. 3/18; not as much improvement in these areas on his heels that I was hoping. I did reapply trilateral Oasis today the tissue looks healthier but not as much fill in as I was hoping. 3/25; better looking today I think this is come in a bit the tissue looks healthier. Triple layer Oasis reapplied #6 4/1; somewhat better looking definitely better looking surface not as much change in surface area as I was hoping. He may be spending more time [place] on days then he needs to although he does have heel offloading boots. Triple layer Oasis reapplied #7 4/7; unfortunately apparently [facility] will not approve any further Oasis which is unfortunate since the patient did respond nicely both in terms of the condition of the wound bed as well as surface area. There is still some drainage coming from the wound but not a lot there does not appear to be any infection 4/15; we have been using Hydrofera Blue. He continues to have nice rims of epithelialization on the right greater than the left. The left the epithelialization is coming from the tip of his heel. There is moderate drainage. In this that concerns me about a  total contact cast. There is no evidence of infection 4/29; patient has been using Hydrofera Blue with dressing changes. He has no complaints or issues today. 5/5; using Hydrofera Blue. I actually think that he looks marginally better than the last time I saw this 3 weeks ago. There are rims of epithelialization on the left thumb coming from the medial side on the right. Using Hydrofera Blue 5/12; using Hydrofera Blue. These continue to make improvements in surface area. His drainage was not listed as severe I therefore went ahead and put a cast on the left foot. Right foot we will continue to dress his previous 5/16; back for first total contact cast change. He did not tolerate this particularly well cast injury on the anterior tibia among other issues. Difficulty sleeping. I talked him about this in some detail and afterwards is elected to continue. I told him I would like to have a cast on for 3 weeks to see if this is going to help at all. I think he agreed 5/19; I think the wound is better. There is no tunneling towards his midfoot. The undermining medially also looks better. He has a rim of new skin distally. I think we are making progress here. The area on the left also continues to look somewhat better to me using Hydrofera Blue. He has a list of complaints about the cast but none of them seem serious 5/26; patient presents for 1 week follow-up. He has been using a total contact cast and tolerating this well. Hydrofera Blue is the main dressing used. He denies signs of infection. 6/2 Hydrofera Blue total contact cast on the left. These were large ulcers that formed in intensive care unit where the patient was recovering from [facility]. May have had something to do with being ventilated in an upright positiono Pressors etc. We have been able to get the areas down considerably and a viable surface. There is some epithelialization in both sides. Note made of drainage 6/9; changed to [facility]  last time because of drainage. He arrives with better looking surfaces and dimensions on the left than the  right. Paradoxically the right actually probes more towards his midfoot the left is largely close down but both of these look improved. Using a total contact cast on the left 6/16; complex wounds on his bilateral plantar heels which were initially pressure injury from a stay in the ICU with COVID. We have been using silver alginate most recently. His dimensions of come in quite dramatically however not recently. We have been putting the left foot in a total contact cast 6/23; complex wounds on the bilateral plantar heels. I been putting the left in the cast paradoxically the area on the right is the one that is going towards closure at a faster rate. Quite a bit of drainage on the left. The patient went to see Dr. Amalia Hailey who said he was going to standby for skin grafts. I had actually considered sending him for skin grafts however he would be mandatorily off his feet for a period of weeks to months. I am thinking that the area on the right is going to close on its own the area on the left has been more stubborn even though we have him in a total contact cast 6/30; took him out of a total contact cast last week is the right heel seem to be making better progress than the left where I was placing the cast. We are using silver alginate. Both wounds are smaller right greater than left 7/12; both wounds look as though they are making some progress. We are using silver alginate. Heel offloading boots 7/26; very gradual progress especially on the right. Using silver alginate. He is wearing heel offloading boots 8/18; he continues to close these wounds down very gradually. Using silver alginate. The problem polymen being definitive about this is areas of what appears to be callus around the margins. This is not a 100% of the area but certainly sizable especially on the right 9/1; bilateral plantar feet  wounds secondary to prolonged pressure while being ventilated for COVID-19 in an upright position. Essentially pressure ulcers on the bottom of his feet. He is made substantial progress using silver alginate. 9/14; bilateral plantar feet wounds secondary to prolonged pressure. Making progress using silver alginate. Objective Constitutional Patient is hypertensive.. Pulse regular and within target range for patient.Marland Kitchen Respirations regular, non-labored and within target range.. Temperature is normal and within the target range for the patient.Marland Kitchen Appears in no distress. Vitals Time Taken: 10:00 AM, Height: 69 in, Source: Stated, Weight: 280 lbs, Source: Stated, BMI: 41.3, Temperature: 98.6 F, Pulse: 86 bpm, Respiratory Rate: 18 breaths/min, Blood Pressure: 148/91 mmHg. General Notes: Wound exam; bilateral plantar heels. On the right there is a linear area extending towards the toes. I used a #5 curette in conjunction with MolecuLight to try and identify bacterial loads however I also removed surface debris from the entirety of the wounds. Hemostasis with direct pressure Integumentary (Hair, Skin) Wound #1 status is Open. Original cause of wound was Pressure Injury. The date acquired was: 10/07/2019. The wound has been in treatment 40 weeks. The wound is located on the Right Calcaneus. The wound measures 4.3cm length x 1.3cm width x 0.6cm depth; 4.39cm^2 area and 2.634cm^3 volume. There is Fat Layer (Subcutaneous Tissue) exposed. There is no tunneling or undermining noted. There is a medium amount of serosanguineous drainage noted. The wound margin is thickened. There is large (67-100%) red, pink granulation within the wound bed. There is a small (1-33%) amount of necrotic tissue within the wound bed including Adherent Slough. Wound #2 status  is Open. Original cause of wound was Pressure Injury. The date acquired was: 10/07/2019. The wound has been in treatment 40 weeks. The wound is located on the Left  Calcaneus. The wound measures 3.9cm length x 1.3cm width x 0.4cm depth; 3.982cm^2 area and 1.593cm^3 volume. There is Fat Layer (Subcutaneous Tissue) exposed. There is no tunneling noted, however, there is undermining starting at 8:00 and ending at 10:00 with a maximum distance of 0.3cm. There is a medium amount of serosanguineous drainage noted. The wound margin is thickened. There is medium (34-66%) red granulation within the wound bed. There is a medium (34-66%) amount of necrotic tissue within the wound bed including Adherent Slough. Assessment Active Problems ICD-10 Non-pressure chronic ulcer of other part of right foot with fat layer exposed Pressure ulcer of other site, stage 3 Non-pressure chronic ulcer of other 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 Procedures Wound #1 Pre-procedure diagnosis of Wound #1 is a Pressure Ulcer located on the Right Calcaneus . There was a Excisional Skin/Subcutaneous Tissue Debridement with a total area of 9 sq cm performed by [name], MD. With the following instrument(s): Curette to remove Viable and Non-Viable tissue/material. Material removed includes Callus, Subcutaneous Tissue, Slough, Skin: Dermis, Skin: Epidermis, and Fibrin/Exudate after achieving pain control using Lidocaine 4% T opical Solution. A time out was conducted at 10:28, prior to the start of the procedure. A Moderate amount of bleeding was controlled with Pressure. The procedure was tolerated well with a pain level of 0 throughout and a pain level of 1 following the procedure. Post Debridement Measurements: 4.3cm length x 1.3cm width x 0.6cm depth; 2.634cm^3 volume. Post debridement Stage noted as Category/Stage III. Character of Wound/Ulcer Post Debridement is improved. Post procedure Diagnosis Wound #1: Same as Pre-Procedure Wound #2 Pre-procedure diagnosis of Wound #2 is a Pressure Ulcer located on the Left Calcaneus . There was a Excisional Skin/Subcutaneous Tissue  Debridement with a total area of 6 sq cm performed by [name], MD. With the following instrument(s): Curette to remove Viable and Non-Viable tissue/material. Material removed includes Callus, Subcutaneous Tissue, Slough, Skin: Dermis, Skin: Epidermis, and Fibrin/Exudate after achieving pain control using Lidocaine 4% T opical Solution. A time out was conducted at 10:28, prior to the start of the procedure. A Moderate amount of bleeding was controlled with Pressure. The procedure was tolerated well with a pain level of 0 throughout and a pain level of 1 following the procedure. Post Debridement Measurements: 3.9cm length x 1.3cm width x 0.4cm depth; 1.593cm^3 volume. Post debridement Stage noted as Category/Stage III. Character of Wound/Ulcer Post Debridement is improved. Post procedure Diagnosis Wound #2: Same as Pre-Procedure Plan Follow-up Appointments: Return Appointment in 2 weeks. - with Dr. Dellia Nims Cellular or Tissue Based Products: Wound #1 Right Calcaneus: Cellular or Tissue Based Product Type: - oasis burn run insurance authorization Wound #2 Left Calcaneus: Cellular or Tissue Based Product Type: - oasis burn run Radio broadcast assistant Hygiene: May shower with protection but do not get wound dressing(s) wet. Edema Control - Lymphedema / SCD / Other: Elevate legs to the level of the heart or above for 30 minutes daily and/or when sitting, a frequency of: - throughout the day Avoid standing for long periods of time. Moisturize legs daily. - right leg and foot every night. Off-Loading: Wedge shoe to: - Glophed Shoe to both feet. Other: - keep pressure off of the bottom of your feet Additional Orders / Instructions: Follow Nutritious Diet WOUND #1: -  Calcaneus Wound Laterality: Right Cleanser: Normal Saline (Generic) Every Other Day/30 Days Discharge Instructions: Cleanse the wound with Normal Saline prior to applying a clean dressing using gauze sponges,  not tissue or cotton balls. Cleanser: Wound Cleanser Every Other Day/30 Days Discharge Instructions: Cleanse the wound with wound cleanser prior to applying a clean dressing using gauze sponges, not tissue or cotton balls. Prim Dressing: Iodosorb Gel 10 (gm) Tube (DME) (Generic) Every Other Day/30 Days ary Discharge Instructions: Apply to wound bed as instructed Secondary Dressing: Zetuvit Plus 4x8 in (Generic) Every Other Day/30 Days Discharge Instructions: Apply over primary dressing as directed. Secured With: Elastic Bandage 4 inch (ACE bandage) Every Other Day/30 Days Discharge Instructions: Secure with ACE bandage as directed. Secured With: [employer], 4.5x3.1 (in/yd) (Generic) Every Other Day/30 Days Discharge Instructions: Secure with Kerlix as directed. WOUND #2: - Calcaneus Wound Laterality: Left Cleanser: Normal Saline (Generic) Every Other Day/30 Days Discharge Instructions: Cleanse the wound with Normal Saline prior to applying a clean dressing using gauze sponges, not tissue or cotton balls. Cleanser: Wound Cleanser Every Other Day/30 Days Discharge Instructions: Cleanse the wound with wound cleanser prior to applying a clean dressing using gauze sponges, not tissue or cotton balls. Prim Dressing: Iodosorb Gel 10 (gm) Tube (DME) (Generic) Every Other Day/30 Days ary Discharge Instructions: Apply to wound bed as instructed Secondary Dressing: Zetuvit Plus 4x8 in (Generic) Every Other Day/30 Days Discharge Instructions: Apply over primary dressing as directed. Secured With: Elastic Bandage 4 inch (ACE bandage) Every Other Day/30 Days Discharge Instructions: Secure with ACE bandage as directed. Secured With: [employer], 4.5x3.1 (in/yd) (Generic) Every Other Day/30 Days Discharge Instructions: Secure with Kerlix as directed. 1. I change the dressing to Iodoflex 2. Check to make sure that his heel offloading boots do not need to be changed 3. I may need to  consider a deep tissue culture next time 4. We used the MolecuLight to identify areas of red fluorescence prompting debridement especially in the areas that were callused over. 5. I have asked to consider Oasis burn MolecuLight DX: 1st Scanned Wound The following wound was scanned with MolecuLight DX): right and left foot Fluorescence bacterial imaging was medically necessary today due to Wound previously healing as expected but has recently stalled (flattening of (Indication): the area/volume) MolecuLight Results Red Colors The indicated colors were noted in the following area(s). In the periphery of the wound As a result of todays scan, the following treatment plans were put in place. Debridement. Consideration of deep tissue culture Electronic Signature(s) Signed: 04/22/2021 4:57:28 PM By: [name] Ham MD Entered By: [name] on 04/22/2021 10:51:30 -------------------------------------------------------------------------------- SuperBill Details Patient Name: Date of Service: [name], [place] 04/22/2021 Medical Record Number: [id number] Patient Account Number: [id number] Date of Birth/Sex: Treating RN: [birth year] (45 y.o. [name], [name] Primary Care Provider: Cristie [name] Other Clinician: Referring Provider: Treating Provider/Extender: [name] in Treatment: 40 Diagnosis Coding ICD-10 Codes Code Description [phone] Non-pressure chronic ulcer of other part of right foot with fat layer exposed L89.893 Pressure ulcer of other site, stage 3 L97.522 Non-pressure chronic ulcer of other 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 Facility Procedures CPT4 Code: [id number] Description: [place] - DEB SUBQ TISSUE 20 SQ CM/< ICD-10 Diagnosis Description L97.512 Non-pressure chronic ulcer of other part of right foot with fat layer exposed L97.522 Non-pressure chronic ulcer of other 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 Modifier: Quantity: 1 CPT4 Code:  [id number] 0 Description: 784O  NONCNTACT RT FLORO WND 1ST STE ICD-10 Diagnosis Description L97.512 Non-pressure chronic ulcer of other part of right foot with fat layer exposed Modifier: 59 1 Quantity: CPT4 Code: [id number] C Description: PT 4446F NONCNTACT RT FLORO WND EA ADDL - ICD-10 Diagnosis Description L97.522 Non-pressure chronic ulcer of other 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 Modifier: 59 1 Quantity: Physician Procedures : CPT4 Code Description Modifier [id number] - WC PHYS SUBQ TISS 20 SQ CM ICD-10 Diagnosis Description L97.512 Non-pressure chronic ulcer of other part of right foot with fat layer exposed L97.522 Non-pressure chronic ulcer of other part of left foot  with fat layer exposed Quantity: 1 : [id number] NONCNTACT RT FLORO WND 1ST STE 59 ICD-10 Diagnosis Description L97.512 Non-pressure chronic ulcer of other part of right foot with fat layer exposed Quantity: 1 : [id number] NONCNTACT RT FLORO WND EA ADDL 59 ICD-10 Diagnosis Description L97.522 Non-pressure chronic ulcer of other 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 Quantity: 1 Electronic Signature(s) Signed: 04/22/2021 6:28:58 PM By: [name] Pilling RN, BSN Signed: 04/27/2021 4:30:53 PM By: [name] Ham MD Previous Signature: 04/22/2021 4:57:28 PM Version By: [name] Ham MD Entered By: [name] on 04/22/2021 17:51:43

## 2021-05-02 LAB — COLOGUARD: COLOGUARD: NEGATIVE

## 2021-05-06 ENCOUNTER — Encounter (HOSPITAL_BASED_OUTPATIENT_CLINIC_OR_DEPARTMENT_OTHER): Payer: BC Managed Care – PPO | Admitting: Internal Medicine

## 2021-05-06 ENCOUNTER — Other Ambulatory Visit: Payer: Self-pay

## 2021-05-06 DIAGNOSIS — A499 Bacterial infection, unspecified: Secondary | ICD-10-CM | POA: Diagnosis not present

## 2021-05-06 DIAGNOSIS — L89893 Pressure ulcer of other site, stage 3: Secondary | ICD-10-CM | POA: Diagnosis not present

## 2021-05-06 DIAGNOSIS — L89613 Pressure ulcer of right heel, stage 3: Secondary | ICD-10-CM | POA: Diagnosis not present

## 2021-05-06 DIAGNOSIS — L97522 Non-pressure chronic ulcer of other 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: Secondary | ICD-10-CM | POA: Diagnosis not present

## 2021-05-06 DIAGNOSIS — L97512 Non-pressure chronic ulcer of other part of right foot with fat layer exposed: Secondary | ICD-10-CM | POA: Diagnosis not present

## 2021-05-06 DIAGNOSIS — L89623 Pressure ulcer of left heel, stage 3: Secondary | ICD-10-CM | POA: Diagnosis not present

## 2021-05-06 DIAGNOSIS — I10 Essential (primary) hypertension: Secondary | ICD-10-CM | POA: Diagnosis not present

## 2021-05-06 MED ORDER — HYDROCODONE-ACETAMINOPHEN 5-325 MG PO TABS: 1.0000 | ORAL_TABLET | Freq: Four times a day (QID) | ORAL | 0 refills | Status: DC | PRN

## 2021-05-06 NOTE — Telephone Encounter (Signed)
Spoke with pt- he's going to call and get back in with Psychiatry- and see about mood meds esp anxiety.  Also needs Norc0 hasn't refilled since 03/03/21- will refill 5/325 mg Norco #60

## 2021-05-06 NOTE — Addendum Note (Signed)
Addended by: Courtney Heys on: 05/06/2021 10:42 AM   Modules accepted: Orders

## 2021-05-07 NOTE — Progress Notes (Signed)
[name], [name] ([id number]) Visit Report for 05/06/2021 Debridement Details Patient Name: Date of Service: [name], [name] 05/06/2021 9:00 A M Medical Record Number: [id number] Patient Account Number: [id number] Date of Birth/Sex: Treating RN: September 17, 1975 (45 y.o. [name] Primary Care Provider: Cristie [name] Other Clinician: Referring Provider: Treating Provider/Extender: [name] in Treatment: 42 Debridement Performed for Assessment: Wound #1 Right Calcaneus Performed By: Physician [name], MD Debridement Type: Debridement Level of Consciousness (Pre-procedure): Awake and Alert Pre-procedure Verification/Time Out Yes - 09:35 Taken: Start Time: 09:35 Pain Control: Lidocaine 4% T opical Solution T Area Debrided (L x W): otal 4 (cm) x 1 (cm) = 4 (cm) Tissue and other material debrided: Viable, Non-Viable, Callus, Eschar, Slough, Skin: Epidermis, Slough Level: Skin/Epidermis Debridement Description: Selective/Open Wound Instrument: Curette Bleeding: Minimum Hemostasis Achieved: Pressure Procedural Pain: 0 Post Procedural Pain: 0 Response [name] Treatment: Procedure was tolerated well Level of Consciousness (Post- Awake and Alert procedure): Post Debridement Measurements of Total Wound Length: (cm) 2.7 Stage: Category/Stage III Width: (cm) 1 Depth: (cm) 0.2 Volume: (cm) 0.424 Character of Wound/Ulcer Post Debridement: Improved Post Procedure Diagnosis Same as Pre-procedure Electronic Signature(s) Signed: 05/06/2021 2:50:47 PM By: [name] Pilling RN, BSN Signed: 05/07/2021 3:46:44 PM By: [name] Ham MD Entered By: [name] on 05/06/2021 09:46:52 -------------------------------------------------------------------------------- Debridement Details Patient Name: Date of Service: [name], [name] [place] E. 05/06/2021 9:00 A M Medical Record Number: [id number] Patient Account Number: [id number] Date of Birth/Sex: Treating RN: [birth year]  (45 y.o. [name] Primary Care Provider: Cristie [name] Other Clinician: Referring Provider: Treating Provider/Extender: [name] in Treatment: 42 Debridement Performed for Assessment: Wound #2 Left Calcaneus Performed By: Physician [name], MD Debridement Type: Debridement Level of Consciousness (Pre-procedure): Awake and Alert Pre-procedure Verification/Time Out Yes - 09:35 Taken: Start Time: 09:35 Pain Control: Lidocaine 4% T opical Solution T Area Debrided (L x W): otal 4 (cm) x 1.3 (cm) = 5.2 (cm) Tissue and other material debrided: Viable, Non-Viable, Callus, Slough, Skin: Epidermis, Slough Level: Skin/Epidermis Debridement Description: Selective/Open Wound Instrument: Curette Bleeding: Minimum Hemostasis Achieved: Pressure Procedural Pain: 0 Post Procedural Pain: 0 Response [name] Treatment: Procedure was tolerated well Level of Consciousness (Post- Awake and Alert procedure): Post Debridement Measurements of Total Wound Length: (cm) 4 Stage: Category/Stage III Width: (cm) 1.3 Depth: (cm) 0.2 Volume: (cm) 0.817 Character of Wound/Ulcer Post Debridement: Improved Post Procedure Diagnosis Same as Pre-procedure Electronic Signature(s) Signed: 05/06/2021 2:50:47 PM By: [name] Pilling RN, BSN Signed: 05/07/2021 3:46:44 PM By: [name] Ham MD Entered By: [name] on 05/06/2021 09:47:06 -------------------------------------------------------------------------------- HPI Details Patient Name: Date of Service: [name], [name] [place] E. 05/06/2021 9:00 A M Medical Record Number: [id number] Patient Account Number: [id number] Date of Birth/Sex: Treating RN: 09-15-1975 (45 y.o. [name] Primary Care Provider: Cristie [name] Other Clinician: Referring Provider: Treating Provider/Extender: [name] in Treatment: 42 History of Present Illness HPI Description: Wounds are12/03/2020 upon evaluation today  patient presents for initial inspection here in our clinic concerning issues he has been having with the bottoms of his feet bilaterally. He states these actually occurred as wounds when he was hospitalized for 5 months secondary [name] Covid. He was apparently with tilting bed where he was in an upright position quite frequently and apparently this occurred in some way shape or form during that time. Fortunately there is no sign of active infection at this time. No fevers, chills, nausea, vomiting, or diarrhea. With that being  said he still has substantial wounds on the plantar aspects of his feet Theragen require quite a bit of work [name] get these [name] heal. He has been using Santyl currently though that is been problematic both in receiving the medication as well as actually paid for it as it is become quite expensive. Prior [name] the experience with Covid the patient really did not have any major medical problems other than hypertension he does have some mild generalized weakness following the Covid experience. 07/22/2020 on evaluation today patient appears [name] be doing okay in regard [name] his foot ulcers I feel like the wound beds are showing signs of better improvement that I do believe the Iodoflex is helping in this regard. With that being said he does have a lot of drainage currently and this is somewhat blue/green in nature which is consistent with Pseudomonas. I do think a culture today would be appropriate for [place] [name] evaluate and see if that is indeed the case I would likely start him on antibiotic orally as well he is not allergic [name] Cipro knows of no issues he has had in the past 12/21; patient was admitted [name] the clinic earlier this month with bilateral presumed pressure ulcers on the bottom of his feet apparently related [name] excessive pressure from a tilt table arrangement in the intensive care unit. Patient relates this [name] being on ECMO but I am not really sure that is exactly related [name] that. I must  say I have never seen anything like this. He has fairly extensive full-thickness wounds extending from his heel towards his midfoot mostly centered laterally. There is already been some healing distally. He does not appear [name] have an arterial issue. He has been using gentamicin [name] the wound surfaces with Iodoflex [name] help with ongoing debridement 1/6; this is a patient with pressure ulcers on the bottom of his feet related [name] excessive pressure from a standing position in the intensive care unit. He is complaining of a lot of pain in the right heel. He is not a diabetic. 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 We have been using Iodoflex [name] help prepare the surfaces of both wounds for an advanced treatment product. He is nonambulatory spending most of his time in a wheelchair I have asked him not [name] propel the wheelchair with his heels 1/13; in general his wounds look better not much surface area change we have been using Iodoflex as of last week. I did an x-ray of the right heel as the patient was complaining of pain. I had some thoughts about a stress fracture perhaps Achilles tendon problems however what it showed was erosive changes along the inferior aspect of the calcaneus he now has a MRI booked for 1/20. 1/20; in general his wounds continue [name] be better. Some improvement in the large narrow areas proximally in his foot. He is still complaining of pain in the right heel and tenderness in certain areas of this wound. His MRI is tonight. I am not just looking for osteomyelitis that was brought up on the x-ray I am wondering about stress fractures, tendon ruptures etc. He has no such findings on the left. Also noteworthy is that the patient had critical illness neuropathy and some of the discomfort may be actual improvement in nerve function I am just not sure. These wounds were initially in the setting of severe critical illness related [name] COVID-19. He was put in a  standing position. He may have also been  on pressors at the point contributing [name] tissue ischemia. By his description at some point these wounds were grossly necrotic extending proximally up into the Achilles part of his heel. I do not know that I have ever really seen pictures of them like this although they may exist in epic We have ordered Tri layer Oasis. I am trying [name] stimulate some granulation in these areas. This is of course assuming the MRI is negative for infection 1/27; since the patient was last here he saw Dr. Juleen China of infectious disease. He is planned for vancomycin and ceftriaxone. Prior operative culture grew MSSA. Also ordered baseline lab work. He also ordered arterial studies although the ABIs in our clinic were normal as well as his clinical exam these were normal I do not think he needs [name] see vascular surgery. His ABIs at the PTA were 1.22 in the right triphasic waveforms with a normal TBI of 1.15 on the left ABI of 1.22 with triphasic waveforms and a normal TBI of 1.08. Finally he saw Dr. Amalia Hailey who will follow him in for 2 months. At this point I do not think he felt that he needed a procedure on the right calcaneal bone. Dr. Juleen China is elected for broad-spectrum antibiotic The patient is still having pain in the right heel. He walks with a walker 2/3; wounds are generally smaller. He is tolerating his IV antibiotics. I believe this is vancomycin and ceftriaxone. We are still waiting for Oasis burn in terms of his out-of-pocket max which he should be meeting soon given the IV antibiotics, MRIs etc. I have asked him [name] check in on this. We are using silver collagen in the meantime the wounds look better 2/10; tolerating IV vancomycin and Rocephin. We are waiting [name] apply for Oasis. Although I am not really sure where he is in his out-of-pocket max. 2/17 started the first application of Oasis trilayer. Still on antibiotics. The wounds have generally look better. The area on  the left has a little more surface slough requiring debridement 4/00; second application of Oasis trilayer. The wound surface granulation is generally look better. The area on the left with undermining laterally I think is come in a bit. 10/08/2020 upon evaluation today patient is here today for [employer] application #3. Fortunately he seems [name] be doing extremely well with regard [name] this and we are seeing a lot of new epithelial growth which is great news. Fortunately there is no signs of active infection at this time. 10/16/2020 upon evaluation today patient appears [name] be doing well with regard [name] his foot ulcers. Do believe the Oasis has been of benefit for him. I do not see any signs of infection right now which is great news and I think that he has a lot of new epithelial growth which is great [name] see as well. The patient is very pleased [name] hear all of this. I do think we can proceed with the Oasis trilayer #4 today. 3/18; not as much improvement in these areas on his heels that I was hoping. I did reapply trilateral Oasis today the tissue looks healthier but not as much fill in as I was hoping. 3/25; better looking today I think this is come in a bit the tissue looks healthier. Triple layer Oasis reapplied #6 4/1; somewhat better looking definitely better looking surface not as much change in surface area as I was hoping. He may be spending more time [place] on days then he needs [name] although he does  have heel offloading boots. Triple layer Oasis reapplied #7 4/7; unfortunately apparently [facility] will not approve any further Oasis which is unfortunate since the patient did respond nicely both in terms of the condition of the wound bed as well as surface area. There is still some drainage coming from the wound but not a lot there does not appear [name] be any infection 4/15; we have been using Hydrofera Blue. He continues [name] have nice rims of epithelialization on the right greater than  the left. The left the epithelialization is coming from the tip of his heel. There is moderate drainage. In this that concerns me about a total contact cast. There is no evidence of infection 4/29; patient has been using Hydrofera Blue with dressing changes. He has no complaints or issues today. 5/5; using Hydrofera Blue. I actually think that he looks marginally better than the last time I saw this 3 weeks ago. There are rims of epithelialization on the left thumb coming from the medial side on the right. Using Hydrofera Blue 5/12; using Hydrofera Blue. These continue [name] make improvements in surface area. His drainage was not listed as severe I therefore went ahead and put a cast on the left foot. Right foot we will continue [name] dress his previous 5/16; back for first total contact cast change. He did not tolerate this particularly well cast injury on the anterior tibia among other issues. Difficulty sleeping. I talked him about this in some detail and afterwards is elected [name] continue. I told him I would like [name] have a cast on for 3 weeks [name] see if this is going [name] help at all. I think he agreed 5/19; I think the wound is better. There is no tunneling towards his midfoot. The undermining medially also looks better. He has a rim of new skin distally. I think we are making progress here. The area on the left also continues [name] look somewhat better [name] me using Hydrofera Blue. He has a list of complaints about the cast but none of them seem serious 5/26; patient presents for 1 week follow-up. He has been using a total contact cast and tolerating this well. Hydrofera Blue is the main dressing used. He denies signs of infection. 6/2 Hydrofera Blue total contact cast on the left. These were large ulcers that formed in intensive care unit where the patient was recovering from [place]. May have had something to do with being ventilated in an upright positiono Pressors etc. We have been able [name] get the areas  down considerably and a viable surface. There is some epithelialization in both sides. Note made of drainage 6/9; changed [name] [facility] last time because of drainage. He arrives with better looking surfaces and dimensions on the left than the right. Paradoxically the right actually probes more towards his midfoot the left is largely close down but both of these look improved. Using a total contact cast on the left 6/16; complex wounds on his bilateral plantar heels which were initially pressure injury from a stay in the ICU with COVID. We have been using silver alginate most recently. His dimensions of come in quite dramatically however not recently. We have been putting the left foot in a total contact cast 6/23; complex wounds on the bilateral plantar heels. I been putting the left in the cast paradoxically the area on the right is the one that is going towards closure at a faster rate. Quite a bit of drainage on the left. The  patient went [name] see Dr. Amalia Hailey who said he was going [name] standby for skin grafts. I had actually considered sending him for skin grafts however he would be mandatorily off his feet for a period of weeks [name] months. I am thinking that the area on the right is going [name] close on its own the area on the left has been more stubborn even though we have him in a total contact cast 6/30; took him out of a total contact cast last week is the right heel seem [name] be making better progress than the left where I was placing the cast. We are using silver alginate. Both wounds are smaller right greater than left 7/12; both wounds look as though they are making some progress. We are using silver alginate. Heel offloading boots 7/26; very gradual progress especially on the right. Using silver alginate. He is wearing heel offloading boots 8/18; he continues [name] close these wounds down very gradually. Using silver alginate. The problem polymen being definitive about this is areas of what  appears [name] be callus around the margins. This is not a 100% of the area but certainly sizable especially on the right 9/1; bilateral plantar feet wounds secondary [name] prolonged pressure while being ventilated for COVID-19 in an upright position. Essentially pressure ulcers on the bottom of his feet. He is made substantial progress using silver alginate. 9/14; bilateral plantar feet wounds secondary [name] prolonged pressure. Making progress using silver alginate. 9/29 bilateral plantar feet wounds secondary [name] prolonged pressure. I changed him [name] Iodoflex last week. MolecuLight showing reddened blush fluorescence Electronic Signature(s) Signed: 05/07/2021 3:46:44 PM By: [name] Ham MD Entered By: [name] on 05/06/2021 09:47:46 -------------------------------------------------------------------------------- Physical Exam Details Patient Name: Date of Service: [name], [name] 05/06/2021 9:00 A M Medical Record Number: [id number] Patient Account Number: [id number] Date of Birth/Sex: Treating RN: 08-06-1976 (45 y.o. [name] Primary Care Provider: Cristie [name] Other Clinician: Referring Provider: Treating Provider/Extender: [name] in Treatment: 51 Constitutional Patient is hypertensive.. Pulse regular and within target range for patient.Marland Kitchen Respirations regular, non-labored and within target range.. Temperature is normal and within the target range for the patient.Marland Kitchen Appears in no distress. Notes Wound exam; bilateral plantar heels. MolecuLight still suggested red and blush fluorescence bilaterally more so on the right. Including the divot extending into the right midfoot. I used a #5 curette [name] aggressively remove callus skin and tissue. The wound is measuring smaller and looks like it is contracting. There is no evidence of surrounding infection we are using heel off loader's Electronic Signature(s) Signed: 05/07/2021 3:46:44 PM By: [name] Ham  MD Entered By: [name] on 05/06/2021 09:48:59 -------------------------------------------------------------------------------- Physician Orders Details Patient Name: Date of Service: [name], [name] 05/06/2021 9:00 A M Medical Record Number: [id number] Patient Account Number: [id number] Date of Birth/Sex: Treating RN: 1976/02/03 (45 y.o. [name], Vaughan Basta Primary Care Provider: Cristie [name] Other Clinician: Referring Provider: Treating Provider/Extender: [name] in Treatment: 80 Verbal / Phone Orders: No Diagnosis Coding ICD-10 Coding Code Description [phone] Non-pressure chronic ulcer of other part of right foot with fat layer exposed L89.893 Pressure ulcer of other site, stage 3 L97.522 Non-pressure chronic ulcer of other 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 Follow-up Appointments ppointment in 2 weeks. - with Dr. Dellia Nims Return A Bathing/ Shower/ Hygiene May shower with protection but do not get wound dressing(s) wet. Edema Control - Lymphedema / SCD / Other Bilateral Lower Extremities Elevate  legs [name] the level of the heart or above for 30 minutes daily and/or when sitting, a frequency of: - throughout the day Avoid standing for long periods of time. Moisturize legs daily. - right leg and foot every night. Off-Loading Wedge shoe [name]: - Glophed Shoe [name] both feet. Other: - keep pressure off of the bottom of your feet Additional Orders / Instructions Follow Nutritious Diet Wound Treatment Wound #1 - Calcaneus Wound Laterality: Right Cleanser: Normal Saline (Generic) Every Other Day/30 Days Discharge Instructions: Cleanse the wound with Normal Saline prior [name] applying a clean dressing using gauze sponges, not tissue or cotton balls. Cleanser: Wound Cleanser Every Other Day/30 Days Discharge Instructions: Cleanse the wound with wound cleanser prior [name] applying a clean dressing using gauze sponges, not tissue or cotton balls. Topical:  Mupirocin Ointment Every Other Day/30 Days Discharge Instructions: Apply Mupirocin (Bactroban) thin layer [name] wound bed and wound edges Prim Dressing: Iodosorb Gel 10 (gm) Tube (Generic) Every Other Day/30 Days ary Discharge Instructions: Apply [name] wound bed as instructed Secondary Dressing: Woven Gauze Sponge, Non-Sterile 4x4 in Every Other Day/30 Days Discharge Instructions: Apply over primary dressing as directed. Secondary Dressing: Zetuvit Plus 4x8 in (DME) (Generic) Every Other Day/30 Days Discharge Instructions: Apply over primary dressing as directed. Secured With: Elastic Bandage 4 inch (ACE bandage) Every Other Day/30 Days Discharge Instructions: Secure with ACE bandage as directed. Secured With: [employer], 4.5x3.1 (in/yd) (DME) (Generic) Every Other Day/30 Days Discharge Instructions: Secure with Kerlix as directed. Wound #2 - Calcaneus Wound Laterality: Left Cleanser: Normal Saline (Generic) Every Other Day/30 Days Discharge Instructions: Cleanse the wound with Normal Saline prior [name] applying a clean dressing using gauze sponges, not tissue or cotton balls. Cleanser: Wound Cleanser Every Other Day/30 Days Discharge Instructions: Cleanse the wound with wound cleanser prior [name] applying a clean dressing using gauze sponges, not tissue or cotton balls. Topical: Mupirocin Ointment Every Other Day/30 Days Discharge Instructions: Apply Mupirocin (Bactroban) thin layer [name] wound bed and wound edges Prim Dressing: Iodosorb Gel 10 (gm) Tube (Generic) Every Other Day/30 Days ary Discharge Instructions: Apply [name] wound bed as instructed Secondary Dressing: Woven Gauze Sponge, Non-Sterile 4x4 in Every Other Day/30 Days Discharge Instructions: Apply over primary dressing as directed. Secondary Dressing: Zetuvit Plus 4x8 in (DME) (Generic) Every Other Day/30 Days Discharge Instructions: Apply over primary dressing as directed. Secured With: Elastic Bandage 4 inch (ACE bandage) Every  Other Day/30 Days Discharge Instructions: Secure with ACE bandage as directed. Secured With: [employer], 4.5x3.1 (in/yd) (DME) (Generic) Every Other Day/30 Days Discharge Instructions: Secure with Kerlix as directed. Patient Medications llergies: No Known Drug Allergies A Notifications Medication Indication Start End infection 05/06/2021 mupirocin DOSE topical 2 % ointment - ointment topical apply [name] wounds with dressing changes Electronic Signature(s) Signed: 05/06/2021 6:01:32 PM By: Baruch Gouty RN, BSN Signed: 05/07/2021 3:46:44 PM By: [name] Ham MD Entered By: Baruch Gouty on 05/06/2021 09:54:03 Prescription 05/06/2021 -------------------------------------------------------------------------------- Shirlean Kelly MD Patient Name: Provider: 06-Aug-1976 [id number] Date of Birth: NPI#: [name] [id number] Sex: DEA #: [phone] [id number] Phone #: License #: [place] Patient Address: [address] [facility] [address] [place], [place] [address] [place], [place] [address] [phone] Allergies No Known Drug Allergies Medication Medication: Route: Strength: Form: mupirocin topical 2% ointment Class: TOPICAL ANTIBIOTICS Dose: Frequency / Time: Indication: ointment topical apply [name] wounds with dressing changes infection Number of Refills: Number of Units: 0 Twenty Two (22)  Gram(s) Generic Substitution: Start Date: End Date: Administered at Facility: Substitution Permitted 1/85/6314 No Note [name] Pharmacy: Hand Signature: Date(s): Electronic Signature(s) Signed: 05/06/2021 6:01:32 PM By: Baruch Gouty RN, BSN Signed: 05/07/2021 3:46:44 PM By: [name] Ham MD Entered By: Baruch Gouty on 05/06/2021 09:54:04 -------------------------------------------------------------------------------- Problem List Details Patient Name: Date of Service: [name], [name] 05/06/2021 9:00 A M Medical  Record Number: [id number] Patient Account Number: [id number] Date of Birth/Sex: Treating RN: 12-13-1975 (45 y.o. [name] Primary Care Provider: Cristie [name] Other Clinician: Referring Provider: Treating Provider/Extender: [name] in Treatment: 42 Active Problems ICD-10 Encounter Code Description Active Date MDM Diagnosis [phone] Non-pressure chronic ulcer of other part of right foot with fat layer exposed 09/03/2020 No Yes L89.893 Pressure ulcer of other site, stage 3 07/15/2020 No Yes L97.522 Non-pressure chronic ulcer of other 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 09/03/2020 No Yes Inactive Problems ICD-10 Code Description Active Date Inactive Date M62.81 Muscle weakness (generalized) 07/15/2020 07/15/2020 I10 Essential (primary) hypertension 07/15/2020 07/15/2020 M86.171 Other acute osteomyelitis, right ankle and foot 09/03/2020 09/03/2020 Resolved Problems Electronic Signature(s) Signed: 05/07/2021 3:46:44 PM By: [name] Ham MD Entered By: [name] on 05/06/2021 09:46:31 -------------------------------------------------------------------------------- Progress Note Details Patient Name: Date of Service: [name], [name] 05/06/2021 9:00 A M Medical Record Number: [id number] Patient Account Number: [id number] Date of Birth/Sex: Treating RN: November 12, 1975 (45 y.o. [name] Primary Care Provider: Cristie [name] Other Clinician: Referring Provider: Treating Provider/Extender: [name] in Treatment: 42 Subjective History of Present Illness (HPI) Wounds are12/03/2020 upon evaluation today patient presents for initial inspection here in our clinic concerning issues he has been having with the bottoms of his feet bilaterally. He states these actually occurred as wounds when he was hospitalized for 5 months secondary [name] Covid. He was apparently with tilting bed where he was in an upright position quite frequently and  apparently this occurred in some way shape or form during that time. Fortunately there is no sign of active infection at this time. No fevers, chills, nausea, vomiting, or diarrhea. With that being said he still has substantial wounds on the plantar aspects of his feet Theragen require quite a bit of work [name] get these [name] heal. He has been using Santyl currently though that is been problematic both in receiving the medication as well as actually paid for it as it is become quite expensive. Prior [name] the experience with Covid the patient really did not have any major medical problems other than hypertension he does have some mild generalized weakness following the Covid experience. 07/22/2020 on evaluation today patient appears [name] be doing okay in regard [name] his foot ulcers I feel like the wound beds are showing signs of better improvement that I do believe the Iodoflex is helping in this regard. With that being said he does have a lot of drainage currently and this is somewhat blue/green in nature which is consistent with Pseudomonas. I do think a culture today would be appropriate for [place] [name] evaluate and see if that is indeed the case I would likely start him on antibiotic orally as well he is not allergic [name] Cipro knows of no issues he has had in the past 12/21; patient was admitted [name] the clinic earlier this month with bilateral presumed pressure ulcers on the bottom of his feet apparently related [name] excessive pressure from a tilt table arrangement in the intensive care unit. Patient relates this [name] being on  ECMO but I am not really sure that is exactly related [name] that. I must say I have never seen anything like this. He has fairly extensive full-thickness wounds extending from his heel towards his midfoot mostly centered laterally. There is already been some healing distally. He does not appear [name] have an arterial issue. He has been using gentamicin [name] the wound surfaces with Iodoflex [name] help with  ongoing debridement 1/6; this is a patient with pressure ulcers on the bottom of his feet related [name] excessive pressure from a standing position in the intensive care unit. He is complaining of a lot of pain in the right heel. He is not a diabetic. 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 We have been using Iodoflex [name] help prepare the surfaces of both wounds for an advanced treatment product. He is nonambulatory spending most of his time in a wheelchair I have asked him not [name] propel the wheelchair with his heels 1/13; in general his wounds look better not much surface area change we have been using Iodoflex as of last week. I did an x-ray of the right heel as the patient was complaining of pain. I had some thoughts about a stress fracture perhaps Achilles tendon problems however what it showed was erosive changes along the inferior aspect of the calcaneus he now has a MRI booked for 1/20. 1/20; in general his wounds continue [name] be better. Some improvement in the large narrow areas proximally in his foot. He is still complaining of pain in the right heel and tenderness in certain areas of this wound. His MRI is tonight. I am not just looking for osteomyelitis that was brought up on the x-ray I am wondering about stress fractures, tendon ruptures etc. He has no such findings on the left. Also noteworthy is that the patient had critical illness neuropathy and some of the discomfort may be actual improvement in nerve function I am just not sure. These wounds were initially in the setting of severe critical illness related [name] COVID-19. He was put in a standing position. He may have also been on pressors at the point contributing [name] tissue ischemia. By his description at some point these wounds were grossly necrotic extending proximally up into the Achilles part of his heel. I do not know that I have ever really seen pictures of them like this although they may exist in epic We  have ordered Tri layer Oasis. I am trying [name] stimulate some granulation in these areas. This is of course assuming the MRI is negative for infection 1/27; since the patient was last here he saw Dr. Juleen China of infectious disease. He is planned for vancomycin and ceftriaxone. Prior operative culture grew MSSA. Also ordered baseline lab work. He also ordered arterial studies although the ABIs in our clinic were normal as well as his clinical exam these were normal I do not think he needs [name] see vascular surgery. His ABIs at the P[facility] were 1.22 in the right triphasic waveforms with a normal TBI of 1.15 on the left ABI of 1.22 with triphasic waveforms and a normal TBI of 1.08. Finally he saw Dr. Amalia Hailey who will follow him in for 2 months. At this point I do not think he felt that he needed a procedure on the right calcaneal bone. Dr. Juleen China is elected for broad-spectrum antibiotic The patient is still having pain in the right heel. He walks with a walker 2/3; wounds are generally smaller. He is tolerating  his IV antibiotics. I believe this is vancomycin and ceftriaxone. We are still waiting for Oasis burn in terms of his out-of-pocket max which he should be meeting soon given the IV antibiotics, MRIs etc. I have asked him [name] check in on this. We are using silver collagen in the meantime the wounds look better 2/10; tolerating IV vancomycin and Rocephin. We are waiting [name] apply for Oasis. Although I am not really sure where he is in his out-of-pocket max. 2/17 started the first application of Oasis trilayer. Still on antibiotics. The wounds have generally look better. The area on the left has a little more surface slough requiring debridement 8/78; second application of Oasis trilayer. The wound surface granulation is generally look better. The area on the left with undermining laterally I think is come in a bit. 10/08/2020 upon evaluation today patient is here today for [employer] application #3.  Fortunately he seems [name] be doing extremely well with regard [name] this and we are seeing a lot of new epithelial growth which is great news. Fortunately there is no signs of active infection at this time. 10/16/2020 upon evaluation today patient appears [name] be doing well with regard [name] his foot ulcers. Do believe the Oasis has been of benefit for him. I do not see any signs of infection right now which is great news and I think that he has a lot of new epithelial growth which is great [name] see as well. The patient is very pleased [name] hear all of this. I do think we can proceed with the Oasis trilayer #4 today. 3/18; not as much improvement in these areas on his heels that I was hoping. I did reapply trilateral Oasis today the tissue looks healthier but not as much fill in as I was hoping. 3/25; better looking today I think this is come in a bit the tissue looks healthier. Triple layer Oasis reapplied #6 4/1; somewhat better looking definitely better looking surface not as much change in surface area as I was hoping. He may be spending more time [place] on days then he needs [name] although he does have heel offloading boots. Triple layer Oasis reapplied #7 4/7; unfortunately apparently [facility] will not approve any further Oasis which is unfortunate since the patient did respond nicely both in terms of the condition of the wound bed as well as surface area. There is still some drainage coming from the wound but not a lot there does not appear [name] be any infection 4/15; we have been using Hydrofera Blue. He continues [name] have nice rims of epithelialization on the right greater than the left. The left the epithelialization is coming from the tip of his heel. There is moderate drainage. In this that concerns me about a total contact cast. There is no evidence of infection 4/29; patient has been using Hydrofera Blue with dressing changes. He has no complaints or issues today. 5/5; using Hydrofera Blue. I  actually think that he looks marginally better than the last time I saw this 3 weeks ago. There are rims of epithelialization on the left thumb coming from the medial side on the right. Using Hydrofera Blue 5/12; using Hydrofera Blue. These continue [name] make improvements in surface area. His drainage was not listed as severe I therefore went ahead and put a cast on the left foot. Right foot we will continue [name] dress his previous 5/16; back for first total contact cast change. He did not tolerate this particularly well  cast injury on the anterior tibia among other issues. Difficulty sleeping. I talked him about this in some detail and afterwards is elected [name] continue. I told him I would like [name] have a cast on for 3 weeks [name] see if this is going [name] help at all. I think he agreed 5/19; I think the wound is better. There is no tunneling towards his midfoot. The undermining medially also looks better. He has a rim of new skin distally. I think we are making progress here. The area on the left also continues [name] look somewhat better [name] me using Hydrofera Blue. He has a list of complaints about the cast but none of them seem serious 5/26; patient presents for 1 week follow-up. He has been using a total contact cast and tolerating this well. Hydrofera Blue is the main dressing used. He denies signs of infection. 6/2 Hydrofera Blue total contact cast on the left. These were large ulcers that formed in intensive care unit where the patient was recovering from [place]. May have had something to do with being ventilated in an upright positiono Pressors etc. We have been able [name] get the areas down considerably and a viable surface. There is some epithelialization in both sides. Note made of drainage 6/9; changed [name] [facility] last time because of drainage. He arrives with better looking surfaces and dimensions on the left than the right. Paradoxically the right actually probes more towards his midfoot the left  is largely close down but both of these look improved. Using a total contact cast on the left 6/16; complex wounds on his bilateral plantar heels which were initially pressure injury from a stay in the ICU with COVID. We have been using silver alginate most recently. His dimensions of come in quite dramatically however not recently. We have been putting the left foot in a total contact cast 6/23; complex wounds on the bilateral plantar heels. I been putting the left in the cast paradoxically the area on the right is the one that is going towards closure at a faster rate. Quite a bit of drainage on the left. The patient went [name] see Dr. Amalia Hailey who said he was going [name] standby for skin grafts. I had actually considered sending him for skin grafts however he would be mandatorily off his feet for a period of weeks [name] months. I am thinking that the area on the right is going [name] close on its own the area on the left has been more stubborn even though we have him in a total contact cast 6/30; took him out of a total contact cast last week is the right heel seem [name] be making better progress than the left where I was placing the cast. We are using silver alginate. Both wounds are smaller right greater than left 7/12; both wounds look as though they are making some progress. We are using silver alginate. Heel offloading boots 7/26; very gradual progress especially on the right. Using silver alginate. He is wearing heel offloading boots 8/18; he continues [name] close these wounds down very gradually. Using silver alginate. The problem polymen being definitive about this is areas of what appears [name] be callus around the margins. This is not a 100% of the area but certainly sizable especially on the right 9/1; bilateral plantar feet wounds secondary [name] prolonged pressure while being ventilated for COVID-19 in an upright position. Essentially pressure ulcers on the bottom of his feet. He is made substantial progress  using silver alginate.  9/14; bilateral plantar feet wounds secondary [name] prolonged pressure. Making progress using silver alginate. 9/29 bilateral plantar feet wounds secondary [name] prolonged pressure. I changed him [name] Iodoflex last week. MolecuLight showing reddened blush fluorescence Objective Constitutional Patient is hypertensive.. Pulse regular and within target range for patient.Marland Kitchen Respirations regular, non-labored and within target range.. Temperature is normal and within the target range for the patient.Marland Kitchen Appears in no distress. Vitals Time Taken: 8:53 AM, Height: 69 in, Source: Stated, Weight: 280 lbs, Source: Stated, BMI: 41.3, Temperature: 97.7 F, Pulse: 81 bpm, Respiratory Rate: 18 breaths/min, Blood Pressure: 158/95 mmHg. General Notes: Wound exam; bilateral plantar heels. MolecuLight still suggested red and blush fluorescence bilaterally more so on the right. Including the divot extending into the right midfoot. I used a #5 curette [name] aggressively remove callus skin and tissue. The wound is measuring smaller and looks like it is contracting. There is no evidence of surrounding infection we are using heel off loader's Integumentary (Hair, Skin) Wound #1 status is Open. Original cause of wound was Pressure Injury. The date acquired was: 10/07/2019. The wound has been in treatment 42 weeks. The wound is located on the Right Calcaneus. The wound measures 2.7cm length x 1cm width x 0.3cm depth; 2.121cm^2 area and 0.636cm^3 volume. There is Fat Layer (Subcutaneous Tissue) exposed. There is no tunneling or undermining noted. There is a medium amount of serosanguineous drainage noted. The wound margin is thickened. There is large (67-100%) red, pink granulation within the wound bed. There is a small (1-33%) amount of necrotic tissue within the wound bed including Adherent Slough. Wound #2 status is Open. Original cause of wound was Pressure Injury. The date acquired was: 10/07/2019. The wound has  been in treatment 42 weeks. The wound is located on the Left Calcaneus. The wound measures 4cm length x 1.3cm width x 0.2cm depth; 4.084cm^2 area and 0.817cm^3 volume. There is Fat Layer (Subcutaneous Tissue) exposed. There is no tunneling or undermining noted. There is a medium amount of serosanguineous drainage noted. The wound margin is thickened. There is large (67-100%) red, pink granulation within the wound bed. There is a small (1-33%) amount of necrotic tissue within the wound bed including Adherent Slough. Assessment Active Problems ICD-10 Non-pressure chronic ulcer of other part of right foot with fat layer exposed Pressure ulcer of other site, stage 3 Non-pressure chronic ulcer of other 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 Procedures Wound #1 Pre-procedure diagnosis of Wound #1 is a Pressure Ulcer located on the Right Calcaneus . There was a Selective/Open Wound Skin/Epidermis Debridement with a total area of 4 sq cm performed by [name], MD. With the following instrument(s): Curette [name] remove Viable and Non-Viable tissue/material. Material removed includes Eschar, Callus, Slough, and Skin: Epidermis after achieving pain control using Lidocaine 4% T opical Solution. No specimens were taken. A time out was conducted at 09:35, prior [name] the start of the procedure. A Minimum amount of bleeding was controlled with Pressure. The procedure was tolerated well with a pain level of 0 throughout and a pain level of 0 following the procedure. Post Debridement Measurements: 2.7cm length x 1cm width x 0.2cm depth; 0.424cm^3 volume. Post debridement Stage noted as Category/Stage III. Character of Wound/Ulcer Post Debridement is improved. Post procedure Diagnosis Wound #1: Same as Pre-Procedure Wound #2 Pre-procedure diagnosis of Wound #2 is a Pressure Ulcer located on the Left Calcaneus . There was a Selective/Open Wound Skin/Epidermis Debridement with a total area of 5.2 sq cm  performed  by [name], MD. With the following instrument(s): Curette [name] remove Viable and Non-Viable tissue/material. Material removed includes Callus, Slough, and Skin: Epidermis after achieving pain control using Lidocaine 4% Topical Solution. No specimens were taken. A time out was conducted at 09:35, prior [name] the start of the procedure. A Minimum amount of bleeding was controlled with Pressure. The procedure was tolerated well with a pain level of 0 throughout and a pain level of 0 following the procedure. Post Debridement Measurements: 4cm length x 1.3cm width x 0.2cm depth; 0.817cm^3 volume. Post debridement Stage noted as Category/Stage III. Character of Wound/Ulcer Post Debridement is improved. Post procedure Diagnosis Wound #2: Same as Pre-Procedure Plan Follow-up Appointments: Return Appointment in 2 weeks. - with Dr. Arcola Jansky Shower/ Hygiene: May shower with protection but do not get wound dressing(s) wet. Edema Control - Lymphedema / SCD / Other: Elevate legs [name] the level of the heart or above for 30 minutes daily and/or when sitting, a frequency of: - throughout the day Avoid standing for long periods of time. Moisturize legs daily. - right leg and foot every night. Off-Loading: Wedge shoe [name]: - Glophed Shoe [name] both feet. Other: - keep pressure off of the bottom of your feet Additional Orders / Instructions: Follow Nutritious Diet WOUND #1: - Calcaneus Wound Laterality: Right Cleanser: Normal Saline (Generic) Every Other Day/30 Days Discharge Instructions: Cleanse the wound with Normal Saline prior [name] applying a clean dressing using gauze sponges, not tissue or cotton balls. Cleanser: Wound Cleanser Every Other Day/30 Days Discharge Instructions: Cleanse the wound with wound cleanser prior [name] applying a clean dressing using gauze sponges, not tissue or cotton balls. Topical: Mupirocin Ointment Every Other Day/30 Days Discharge Instructions: Apply Mupirocin  (Bactroban) thin layer [name] wound bed and wound edges Prim Dressing: Iodosorb Gel 10 (gm) Tube (Generic) Every Other Day/30 Days ary Discharge Instructions: Apply [name] wound bed as instructed Secondary Dressing: Woven Gauze Sponge, Non-Sterile 4x4 in Every Other Day/30 Days Discharge Instructions: Apply over primary dressing as directed. Secondary Dressing: Zetuvit Plus 4x8 in (DME) (Generic) Every Other Day/30 Days Discharge Instructions: Apply over primary dressing as directed. Secured With: Elastic Bandage 4 inch (ACE bandage) Every Other Day/30 Days Discharge Instructions: Secure with ACE bandage as directed. Secured With: [employer], 4.5x3.1 (in/yd) (DME) (Generic) Every Other Day/30 Days Discharge Instructions: Secure with Kerlix as directed. WOUND #2: - Calcaneus Wound Laterality: Left Cleanser: Normal Saline (Generic) Every Other Day/30 Days Discharge Instructions: Cleanse the wound with Normal Saline prior [name] applying a clean dressing using gauze sponges, not tissue or cotton balls. Cleanser: Wound Cleanser Every Other Day/30 Days Discharge Instructions: Cleanse the wound with wound cleanser prior [name] applying a clean dressing using gauze sponges, not tissue or cotton balls. Topical: Mupirocin Ointment Every Other Day/30 Days Discharge Instructions: Apply Mupirocin (Bactroban) thin layer [name] wound bed and wound edges Prim Dressing: Iodosorb Gel 10 (gm) Tube (Generic) Every Other Day/30 Days ary Discharge Instructions: Apply [name] wound bed as instructed Secondary Dressing: Woven Gauze Sponge, Non-Sterile 4x4 in Every Other Day/30 Days Discharge Instructions: Apply over primary dressing as directed. Secondary Dressing: Zetuvit Plus 4x8 in (DME) (Generic) Every Other Day/30 Days Discharge Instructions: Apply over primary dressing as directed. Secured With: Elastic Bandage 4 inch (ACE bandage) Every Other Day/30 Days Discharge Instructions: Secure with ACE bandage as  directed. Secured With: [employer], 4.5x3.1 (in/yd) (DME) (Generic) Every Other Day/30 Days Discharge Instructions: Secure with Kerlix as directed. 1. I continued with Iodoflex/Iodosorb. #  2 prescribed Bactroban lightly [name] the surface of the wound under the Iodoflex with dressing changes 3. MolecuLight suggested areas of red fluorescence similar [name] last week more blush fluorescence on the left. Better than last week however. 4. New heel offloading boots. 5. The wound continues [name] contract very gradually MolecuLight DX: 1st Scanned Wound Fluorescence bacterial imaging was medically necessary today due [name] Prior visit fluorescence image demonstrated a high bioburden load monitoring (Indication): impact of prescribe tx MolecuLight Results Red Colors, Blush Colors The indicated colors were noted in the following area(s). In the periphery of the wound, In the center of the wound As a result of todays scan, the following treatment plans were put in place. Bactroban 2% continued debridement MolecuLight Procedure The MolecularLight DX device was cleaned with a disinfectant wipe prior [name] use., The correct patient profile was confirmed and correct wound was verified., Range finder sensor used [name] ensure appropriate distance selected The following was completed: between imaging unit and wound bed, Room lights were turned off and the ambient light sensor was checked., Blue circle appeared around the lightbulb., The fluorescence icon was selected. Screen was tapped [name] enhance focus and the image was captured. Additional drapes were used [name] ensure adequate darknesso No Potential ICD-10 Codes ICD-10 A49.9 Bacterial infection unspecified (Red/Blush/Yellow Color) Additional Scanned Wounds Did you scan any additional Woundso No Electronic Signature(s) Signed: 05/07/2021 3:46:44 PM By: [name] Ham MD Entered By: [name] on 05/06/2021  09:53:22 -------------------------------------------------------------------------------- SuperBill Details Patient Name: Date of Service: [name], [name] 05/06/2021 Medical Record Number: [id number] Patient Account Number: [id number] Date of Birth/Sex: Treating RN: 1976-04-21 (45 y.o. [name], [name] Primary Care Provider: Cristie [name] Other Clinician: Referring Provider: Treating Provider/Extender: [name] in Treatment: 42 Diagnosis Coding ICD-10 Codes Code Description [phone] Non-pressure chronic ulcer of other part of right foot with fat layer exposed L89.893 Pressure ulcer of other site, stage 3 L97.522 Non-pressure chronic ulcer of other 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 Facility Procedures CPT4 Code: [id number] Description: 7597 - DEBRIDE WOUND 1ST 20 SQ CM OR < ICD-10 Diagnosis Description L97.512 Non-pressure chronic ulcer of other part of right foot with fat layer exposed L97.522 Non-pressure chronic ulcer of other 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 Modifier: Quantity: 1 CPT4 Code: [id number] Description: 598T NONCNTACT RT FLORO WND 1ST STE ICD-10 Diagnosis Description L97.512 Non-pressure chronic ulcer of other part of right foot with fat layer exposed L97.522 Non-pressure chronic ulcer of other 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 Modifier: 59 Quantity: 1 Physician Procedures : CPT4 Code Description Modifier [id number] - WC PHYS DEBR WO ANESTH 20 SQ CM ICD-10 Diagnosis Description L97.512 Non-pressure chronic ulcer of other part of right foot with fat layer exposed L97.522 Non-pressure chronic ulcer of other part of left  foot with fat layer exposed Quantity: 1 : [id number] NONCNTACT RT [facility] WND 1ST STE 59 ICD-10 Diagnosis Description L97.512 Non-pressure chronic ulcer of other part of right foot with fat layer exposed L97.522 Non-pressure chronic ulcer of other 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 Quantity:  1 Electronic Signature(s) Signed: 05/06/2021 6:01:32 PM By: Baruch Gouty RN, BSN Signed: 05/07/2021 3:46:44 PM By: [name] Ham MD Entered By: Baruch Gouty on 05/06/2021 10:24:11

## 2021-05-07 NOTE — Progress Notes (Signed)
[name], [name] ([id number]) Visit Report for 05/06/2021 Arrival Information Details Patient Name: Date of Service: [name], [name] 05/06/2021 9:00 A M Medical Record Number: [id number] Patient Account Number: [id number] Date of Birth/Sex: Treating RN: 1976/01/06 (45 y.o. [name] Primary Care [name]: [name] Other Clinician: Referring [name]: Treating [name]/Extender: [name] in Treatment: 65 Visit Information History Since Last Visit Added or deleted any medications: No Patient Arrived: Wheel Chair Any new allergies or adverse reactions: No Arrival Time: 08:48 Had a fall or experienced change in No Accompanied By: spouse activities of daily living that may affect Transfer Assistance: Manual risk of falls: Patient Identification Verified: Yes Signs or symptoms of abuse/neglect since last visito No Secondary Verification Process Completed: Yes Hospitalized since last visit: No Patient Requires Transmission-Based Precautions: No Implantable device outside of the clinic excluding No Patient Has Alerts: No cellular tissue based products placed in the center since last visit: Has Dressing in Place as Prescribed: Yes Has Footwear/Offloading in Place as Prescribed: Yes Left: Other:globoped Right: Other:globoped Pain Present Now: Yes Electronic Signature(s) Signed: 05/06/2021 6:01:32 PM By: Baruch Gouty RN, BSN Entered By: Baruch Gouty on 05/06/2021 08:53:22 -------------------------------------------------------------------------------- Encounter Discharge Information Details Patient Name: Date of Service: [name], [name] 05/06/2021 9:00 A M Medical Record Number: [id number] Patient Account Number: [id number] Date of Birth/Sex: Treating RN: 19-Jul-1976 (45 y.o. [name]: [name] Other Clinician: Referring Filippo Puls: Treating Elysa Womac/Extender: [name] in  Treatment: 42 Encounter Discharge Information Items Post Procedure Vitals Discharge Condition: Stable Temperature (F): 97.7 Ambulatory Status: Wheelchair Pulse (bpm): 81 Discharge Destination: Home Respiratory Rate (breaths/min): 18 Transportation: Private Auto Blood Pressure (mmHg): 158/95 Accompanied By: spouse Schedule Follow-up Appointment: Yes Clinical Summary of Care: Patient Declined Electronic Signature(s) Signed: 05/06/2021 6:01:32 PM By: Baruch Gouty RN, BSN Entered By: Baruch Gouty on 05/06/2021 10:12:56 -------------------------------------------------------------------------------- Lower Extremity Assessment Details Patient Name: Date of Service: [name], [name] 05/06/2021 9:00 A M Medical Record Number: [id number] Patient Account Number: [id number] Date of Birth/Sex: Treating RN: 1976/01/23 (45 y.o. [name] Primary Care Sukari Grist: [name] Other Clinician: Referring Cherie Lasalle: Treating Ryka Beighley/Extender: [name] in Treatment: 42 Edema Assessment Assessed: [name]: No] [name]: No] Edema: [Left: No] [Right: No] Calf Left: Right: Point of Measurement: 29 cm From Medial Instep 42.5 cm 43.5 cm Ankle Left: Right: Point of Measurement: 9 cm From Medial Instep 25 cm 24.5 cm Vascular Assessment Pulses: Dorsalis Pedis Palpable: [Left:Yes] [Right:Yes] Electronic Signature(s) Signed: 05/06/2021 6:01:32 PM By: Baruch Gouty RN, BSN Entered By: Baruch Gouty on 05/06/2021 08:59:05 -------------------------------------------------------------------------------- Multi Wound Chart Details Patient Name: Date of Service: [name], [name] 05/06/2021 9:00 A M Medical Record Number: [id number] Patient Account Number: [id number] Date of Birth/Sex: Treating RN: 27-Dec-1975 (45 y.o. [name] Primary Care Camile Esters: [name] Other Clinician: Referring Kinta Martis: Treating Aziya Arena/Extender: [name] in Treatment: 42 Vital Signs Height(in): 69 Pulse(bpm): 74 Weight(lbs): 280 Blood Pressure(mmHg): 158/95 Body Mass Index(BMI): 41 Temperature(F): 97.7 Respiratory Rate(breaths/min): 18 Photos: [1:Right Calcaneus] [2:Left Calcaneus] [N/A:N/A N/A] Wound Location: [1:Pressure Injury] [2:Pressure Injury] [N/A:N/A] Wounding Event: [1:Pressure Ulcer] [2:Pressure Ulcer] [N/A:N/A] Primary Etiology: [1:Asthma, Angina, Hypertension] [2:Asthma, Angina, Hypertension] [N/A:N/A] Comorbid History: [1:10/07/2019] [2:10/07/2019] [N/A:N/A] Date Acquired: [1:42] [2:42] [N/A:N/A] Weeks of Treatment: [1:Open] [2:Open] [N/A:N/A] Wound Status: [1:2.7x1x0.3] [2:4x1.3x0.2] [N/A:N/A] Measurements L x W x D (cm) [1:2.121] [2:4.084] [N/A:N/A] A (cm) : rea [1:0.636] [0:1.601] [N/A:N/A] Volume (cm) : [  1:93.30%] [2:84.90%] [N/A:N/A] % Reduction in A rea: [1:98.00%] [2:97.00%] [N/A:N/A] % Reduction in Volume: [1:Category/Stage III] [2:Category/Stage III] [N/A:N/A] Classification: [1:Medium] [2:Medium] [N/A:N/A] Exudate A mount: [1:Serosanguineous] [2:Serosanguineous] [N/A:N/A] Exudate Type: [1:red, brown] [2:red, brown] [N/A:N/A] Exudate Color: [1:Thickened] [2:Thickened] [N/A:N/A] Wound Margin: [1:Large (67-100%)] [2:Large (67-100%)] [N/A:N/A] Granulation A mount: [1:Red, Pink] [2:Red, Pink] [N/A:N/A] Granulation Quality: [1:Small (1-33%)] [2:Small (1-33%)] [N/A:N/A] Necrotic A mount: [1:Fat Layer (Subcutaneous Tissue): Yes Fat Layer (Subcutaneous Tissue): Yes N/A] Exposed Structures: [1:Fascia: No Tendon: No Muscle: No Joint: No Bone: No Small (1-33%)] [2:Fascia: No Tendon: No Muscle: No Joint: No Bone: No Small (1-33%)] [N/A:N/A] Epithelialization: [1:Debridement - Excisional] [2:Debridement - Excisional] [N/A:N/A] Debridement: Pre-procedure Verification/Time Out 09:35 [2:09:35] [N/A:N/A] Taken: [1:Lidocaine 4% Topical Solution] [2:Lidocaine 4% Topical Solution] [N/A:N/A] Pain Control:  [1:Callus, Subcutaneous, Slough] [2:Callus, Subcutaneous, Slough] [N/A:N/A] Tissue Debrided: [1:Skin/Subcutaneous Tissue] [2:Skin/Subcutaneous Tissue] [N/A:N/A] Level: [1:4] [2:5.2] [N/A:N/A] Debridement A (sq cm): [1:rea Curette] [2:Curette] [N/A:N/A] Instrument: [1:Minimum] [2:Minimum] [N/A:N/A] Bleeding: [1:Pressure] [2:Pressure] [N/A:N/A] Hemostasis A chieved: [1:0] [2:0] [N/A:N/A] Procedural Pain: [1:0] [2:0] [N/A:N/A] Post Procedural Pain: [1:Procedure was tolerated well] [2:Procedure was tolerated well] [N/A:N/A] Debridement Treatment Response: [1:2.7x1x0.2] [2:4x1.3x0.2] [N/A:N/A] Post Debridement Measurements L x W x D (cm) [1:0.424] [8:3.338] [N/A:N/A] Post Debridement Volume: (cm) [1:Category/Stage III] [2:Category/Stage III] [N/A:N/A] Post Debridement Stage: [1:Debridement] [2:Debridement] [N/A:N/A] Treatment Notes Electronic Signature(s) Signed: 05/06/2021 2:50:47 PM By: [name] RN, BSN Signed: 05/07/2021 3:46:44 PM By: Linton Ham MD Entered By: Linton Ham on 05/06/2021 09:46:38 -------------------------------------------------------------------------------- Multi-Disciplinary Care Plan Details Patient Name: Date of Service: [name], [name] 05/06/2021 9:00 A M Medical Record Number: [id number] Patient Account Number: [id number] Date of Birth/Sex: Treating RN: [birth year] (45 y.o. [name] Primary Care Aiyana Stegmann: [name] Other Clinician: Referring Coburn Knaus: Treating Vyncent Overby/Extender: [name] in Treatment: 42 Multidisciplinary Care Plan reviewed with physician Active Inactive Abuse / Safety / Falls / Self Care Management Nursing Diagnoses: Potential for falls Goals: Patient/caregiver will verbalize/demonstrate measures taken to prevent injury and/or falls Date Initiated: 07/15/2020 Target Resolution Date: 06/03/2021 Goal Status: Active Interventions: Assess fall risk on admission and as needed Assess  impairment of mobility on admission and as needed per policy Notes: Wound/Skin Impairment Nursing Diagnoses: Impaired tissue integrity Knowledge deficit related to ulceration/compromised skin integrity Goals: Patient/caregiver will verbalize understanding of skin care regimen Date Initiated: 07/15/2020 Target Resolution Date: 06/03/2021 Goal Status: Active Ulcer/skin breakdown will have a volume reduction of 30% by week 4 Date Initiated: 07/15/2020 Date Inactivated: 08/20/2020 Target Resolution Date: 09/03/2020 Goal Status: Unmet Unmet Reason: no major changes. Ulcer/skin breakdown will heal within 14 weeks Date Initiated: 12/04/2020 Date Inactivated: 12/10/2020 Target Resolution Date: 12/10/2020 Unmet Reason: wounds still open at 14 Goal Status: Unmet weeks and today 21 weeks. Interventions: Assess patient/caregiver ability to obtain necessary supplies Assess patient/caregiver ability to perform ulcer/skin care regimen upon admission and as needed Assess ulceration(s) every visit Provide education on ulcer and skin care Treatment Activities: Skin care regimen initiated : 07/15/2020 Topical wound management initiated : 07/15/2020 Notes: Electronic Signature(s) Signed: 05/06/2021 6:01:32 PM By: Baruch Gouty RN, BSN Entered By: Baruch Gouty on 05/06/2021 09:13:27 -------------------------------------------------------------------------------- Pain Assessment Details Patient Name: Date of Service: [name], [place] E. 05/06/2021 9:00 A M Medical Record Number: [id number] Patient Account Number: [id number] Date of Birth/Sex: Treating RN: 1976-06-02 (45 y.o. [name] Primary Care Nezzie Manera: [name] Other Clinician: Referring Dantae Meunier: Treating Candiss Galeana/Extender: [name] in Treatment: 42 Active Problems Location of Pain Severity and Description of Pain Patient  Has Paino Yes Site Locations Pain Location: Pain in Ulcers With Dressing  Change: Yes Duration of the Pain. Constant / Intermittento Intermittent Rate the pain. Current Pain Level: 4 Worst Pain Level: 6 Least Pain Level: 0 Character of Pain Describe the Pain: Aching Pain Management and Medication Current Pain Management: Medication: Yes Is the Current Pain Management Adequate: Adequate How does your wound impact your activities of daily livingo Sleep: No Bathing: No Appetite: No Relationship With Others: No Bladder Continence: No Emotions: No Bowel Continence: No Work: No Toileting: No Drive: No Dressing: No Hobbies: No Electronic Signature(s) Signed: 05/06/2021 6:01:32 PM By: Baruch Gouty RN, BSN Entered By: Baruch Gouty on 05/06/2021 08:55:08 -------------------------------------------------------------------------------- Patient/Caregiver Education Details Patient Name: Date of Service: [place], [name] 9/29/2022andnbsp9:00 A M Medical Record Number: [id number] Patient Account Number: [id number] Date of Birth/Gender: Treating RN: 19-Dec-1975 (45 y.o. [name] Primary Care Physician: [name] Other Clinician: Referring Physician: Treating Physician/Extender: [name] in Treatment: 19 Education Assessment Education Provided To: Patient Education Topics Provided Offloading: Methods: Explain/Verbal Responses: Reinforcements needed, State content correctly Wound/Skin Impairment: Methods: Explain/Verbal Responses: Reinforcements needed, State content correctly Electronic Signature(s) Signed: 05/06/2021 6:01:32 PM By: Baruch Gouty RN, BSN Entered By: Baruch Gouty on 05/06/2021 09:13:49 -------------------------------------------------------------------------------- Wound Assessment Details Patient Name: Date of Service: [name], [place] 05/06/2021 9:00 A M Medical Record Number: [id number] Patient Account Number: [id number] Date of Birth/Sex: Treating RN: 1975/12/10 (45 y.o. [name] Primary Care Braniya Farrugia: [name] Other Clinician: Referring Donielle Radziewicz: Treating Reign Bartnick/Extender: [name] in Treatment: 42 Wound Status Wound Number: 1 Primary Etiology: Pressure Ulcer Wound Location: Right Calcaneus Wound Status: Open Wounding Event: Pressure Injury Comorbid History: Asthma, Angina, Hypertension Date Acquired: 10/07/2019 Weeks Of Treatment: 42 Clustered Wound: No Photos Wound Measurements Length: (cm) 2.7 Width: (cm) 1 Depth: (cm) 0.3 Area: (cm) 2.121 Volume: (cm) 0.636 % Reduction in Area: 93.3% % Reduction in Volume: 98% Epithelialization: Small (1-33%) Tunneling: No Undermining: No Wound Description Classification: Category/Stage III Wound Margin: Thickened Exudate Amount: Medium Exudate Type: Serosanguineous Exudate Color: red, brown Foul Odor After Cleansing: No Slough/Fibrino No Wound Bed Granulation Amount: Large (67-100%) Exposed Structure Granulation Quality: Red, Pink Fascia Exposed: No Necrotic Amount: Small (1-33%) Fat Layer (Subcutaneous Tissue) Exposed: Yes Necrotic Quality: Adherent Slough Tendon Exposed: No Muscle Exposed: No Joint Exposed: No Bone Exposed: No Treatment Notes Wound #1 (Calcaneus) Wound Laterality: Right Cleanser Normal Saline Discharge Instruction: Cleanse the wound with Normal Saline prior to applying a clean dressing using gauze sponges, not tissue or cotton balls. Wound Cleanser Discharge Instruction: Cleanse the wound with wound cleanser prior to applying a clean dressing using gauze sponges, not tissue or cotton balls. Peri-Wound Care Topical Mupirocin Ointment Discharge Instruction: Apply Mupirocin (Bactroban) thin layer to wound bed and wound edges Primary Dressing Iodosorb Gel 10 (gm) Tube Discharge Instruction: Apply to wound bed as instructed Secondary Dressing Woven Gauze Sponge, Non-Sterile 4x4 in Discharge Instruction: Apply over primary dressing  as directed. Zetuvit Plus 4x8 in Discharge Instruction: Apply over primary dressing as directed. Secured With Elastic Bandage 4 inch (ACE bandage) Discharge Instruction: Secure with ACE bandage as directed. Kerlix Roll Sterile, 4.5x3.1 (in/yd) Discharge Instruction: Secure with Kerlix as directed. Compression Wrap Compression Stockings Add-Ons Electronic Signature(s) Signed: 05/06/2021 6:01:32 PM By: Baruch Gouty RN, BSN Entered By: Baruch Gouty on 05/06/2021 09:43:51 -------------------------------------------------------------------------------- Wound Assessment Details Patient Name: Date of Service: [name], [place] 05/06/2021 9:00 A M Medical  Record Number: [id number] Patient Account Number: [id number] Date of Birth/Sex: Treating RN: Dec 08, 1975 (45 y.o. [name] Primary Care Rosaleen Mazer: [name] Other Clinician: Referring Hollace Michelli: Treating Leonardo Makris/Extender: [name] in Treatment: 42 Wound Status Wound Number: 2 Primary Etiology: Pressure Ulcer Wound Location: Left Calcaneus Wound Status: Open Wounding Event: Pressure Injury Comorbid History: Asthma, Angina, Hypertension Date Acquired: 10/07/2019 Weeks Of Treatment: 42 Clustered Wound: No Photos Wound Measurements Length: (cm) 4 Width: (cm) 1.3 Depth: (cm) 0.2 Area: (cm) 4.084 Volume: (cm) 0.817 % Reduction in Area: 84.9% % Reduction in Volume: 97% Epithelialization: Small (1-33%) Tunneling: No Undermining: No Wound Description Classification: Category/Stage III Wound Margin: Thickened Exudate Amount: Medium Exudate Type: Serosanguineous Exudate Color: red, brown Foul Odor After Cleansing: No Slough/Fibrino No Wound Bed Granulation Amount: Large (67-100%) Exposed Structure Granulation Quality: Red, Pink Fascia Exposed: No Necrotic Amount: Small (1-33%) Fat Layer (Subcutaneous Tissue) Exposed: Yes Necrotic Quality: Adherent Slough Tendon Exposed: No Muscle  Exposed: No Joint Exposed: No Bone Exposed: No Treatment Notes Wound #2 (Calcaneus) Wound Laterality: Left Cleanser Normal Saline Discharge Instruction: Cleanse the wound with Normal Saline prior to applying a clean dressing using gauze sponges, not tissue or cotton balls. Wound Cleanser Discharge Instruction: Cleanse the wound with wound cleanser prior to applying a clean dressing using gauze sponges, not tissue or cotton balls. Peri-Wound Care Topical Mupirocin Ointment Discharge Instruction: Apply Mupirocin (Bactroban) thin layer to wound bed and wound edges Primary Dressing Iodosorb Gel 10 (gm) Tube Discharge Instruction: Apply to wound bed as instructed Secondary Dressing Woven Gauze Sponge, Non-Sterile 4x4 in Discharge Instruction: Apply over primary dressing as directed. Zetuvit Plus 4x8 in Discharge Instruction: Apply over primary dressing as directed. Secured With Elastic Bandage 4 inch (ACE bandage) Discharge Instruction: Secure with ACE bandage as directed. Kerlix Roll Sterile, 4.5x3.1 (in/yd) Discharge Instruction: Secure with Kerlix as directed. Compression Wrap Compression Stockings Add-Ons Electronic Signature(s) Signed: 05/06/2021 6:01:32 PM By: Baruch Gouty RN, BSN Entered By: Baruch Gouty on 05/06/2021 09:42:46 -------------------------------------------------------------------------------- Vitals Details Patient Name: Date of Service: [name], [place] 05/06/2021 9:00 A M Medical Record Number: [id number] Patient Account Number: [id number] Date of Birth/Sex: Treating RN: 31-May-1976 (45 y.o. [name] Primary Care Eladio Dentremont: [name] Other Clinician: Referring Dessie Delcarlo: Treating Grason Brailsford/Extender: [name] in Treatment: 42 Vital Signs Time Taken: 08:53 Temperature (F): 97.7 Height (in): 69 Pulse (bpm): 81 Source: Stated Respiratory Rate (breaths/min): 18 Weight (lbs): 280 Blood Pressure (mmHg):  158/95 Source: Stated Reference Range: 80 - 120 mg / dl Body Mass Index (BMI): 41.3 Electronic Signature(s) Signed: 05/06/2021 6:01:32 PM By: Baruch Gouty RN, BSN Entered By: Baruch Gouty on 05/06/2021 08:53:52

## 2021-05-09 DIAGNOSIS — G4734 Idiopathic sleep related nonobstructive alveolar hypoventilation: Secondary | ICD-10-CM | POA: Diagnosis not present

## 2021-05-09 DIAGNOSIS — U071 COVID-19: Secondary | ICD-10-CM | POA: Diagnosis not present

## 2021-05-10 DIAGNOSIS — S91309A Unspecified open wound, unspecified foot, initial encounter: Secondary | ICD-10-CM | POA: Diagnosis not present

## 2021-05-16 DIAGNOSIS — M6259 Muscle wasting and atrophy, not elsewhere classified, multiple sites: Secondary | ICD-10-CM | POA: Diagnosis not present

## 2021-05-16 DIAGNOSIS — M6281 Muscle weakness (generalized): Secondary | ICD-10-CM | POA: Diagnosis not present

## 2021-05-16 DIAGNOSIS — G7281 Critical illness myopathy: Secondary | ICD-10-CM | POA: Diagnosis not present

## 2021-05-16 DIAGNOSIS — Z741 Need for assistance with personal care: Secondary | ICD-10-CM | POA: Diagnosis not present

## 2021-05-18 ENCOUNTER — Other Ambulatory Visit: Payer: Self-pay

## 2021-05-18 ENCOUNTER — Encounter (HOSPITAL_BASED_OUTPATIENT_CLINIC_OR_DEPARTMENT_OTHER): Payer: BC Managed Care – PPO | Attending: Internal Medicine | Admitting: Internal Medicine

## 2021-05-18 DIAGNOSIS — I10 Essential (primary) hypertension: Secondary | ICD-10-CM | POA: Diagnosis not present

## 2021-05-18 DIAGNOSIS — L97512 Non-pressure chronic ulcer of other part of right foot with fat layer exposed: Secondary | ICD-10-CM | POA: Diagnosis not present

## 2021-05-18 DIAGNOSIS — L97522 Non-pressure chronic ulcer of other 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: Secondary | ICD-10-CM

## 2021-05-18 DIAGNOSIS — L89893 Pressure ulcer of other site, stage 3: Secondary | ICD-10-CM | POA: Diagnosis not present

## 2021-05-18 NOTE — Progress Notes (Addendum)
[name], [name] ([id number]) Visit Report for 05/18/2021 Chief Complaint Document Details Patient Name: Date of Service: [name] [place] 05/18/2021 9:45 A M Medical Record Number: [id number] Patient Account Number: [id number] Date of Birth/Sex: Treating RN: April 23, 1976 (45 y.o. [name] Primary Care Provider: [name] Other Clinician: Referring Provider: Treating Provider/Extender: [name] in Treatment: 92 Information Obtained from: Patient Chief Complaint Bilateral Plantar Foot Ulcers Electronic Signature(s) Signed: 05/18/2021 10:42:47 AM By: Kalman Shan DO Entered By: Kalman Shan on 05/18/2021 10:39:39 -------------------------------------------------------------------------------- HPI Details Patient Name: Date of Service: [name], [place] 05/18/2021 9:45 A M Medical Record Number: [id number] Patient Account Number: [id number] Date of Birth/Sex: Treating RN: [birth year] (45 y.o. [name] Primary Care Provider: [name] Other Clinician: Referring Provider: Treating Provider/Extender: [name] in Treatment: 45 History of Present Illness HPI Description: Wounds are12/03/2020 upon evaluation today patient presents for initial inspection here in our clinic concerning issues he has been having with the bottoms of his feet bilaterally. He states these actually occurred as wounds when he was hospitalized for 5 months secondary to Covid. He was apparently with tilting bed where he was in an upright position quite frequently and apparently this occurred in some way shape or form during that time. Fortunately there is no sign of active infection at this time. No fevers, chills, nausea, vomiting, or diarrhea. With that being said he still has substantial wounds on the plantar aspects of his feet Theragen require quite a bit of work to get these to heal. He has been using Santyl currently though that is been  problematic both in receiving the medication as well as actually paid for it as it is become quite expensive. Prior to the experience with Covid the patient really did not have any major medical problems other than hypertension he does have some mild generalized weakness following the Covid experience. 07/22/2020 on evaluation today patient appears to be doing okay in regard to his foot ulcers I feel like the wound beds are showing signs of better improvement that I do believe the Iodoflex is helping in this regard. With that being said he does have a lot of drainage currently and this is somewhat blue/green in nature which is consistent with Pseudomonas. I do think a culture today would be appropriate for [place] to evaluate and see if that is indeed the case I would likely start him on antibiotic orally as well he is not allergic to Cipro knows of no issues he has had in the past 12/21; patient was admitted to the clinic earlier this month with bilateral presumed pressure ulcers on the bottom of his feet apparently related to excessive pressure from a tilt table arrangement in the intensive care unit. Patient relates this to being on ECMO but I am not really sure that is exactly related to that. I must say I have never seen anything like this. He has fairly extensive full-thickness wounds extending from his heel towards his midfoot mostly centered laterally. There is already been some healing distally. He does not appear to have an arterial issue. He has been using gentamicin to the wound surfaces with Iodoflex to help with ongoing debridement 1/6; this is a patient with pressure ulcers on the bottom of his feet related to excessive pressure from a standing position in the intensive care unit. He is complaining of a lot of pain in the right heel. He is not a diabetic. 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 We have been using Iodoflex to help prepare the surfaces of both wounds  for an advanced treatment product. He is nonambulatory spending most of his time in a wheelchair I have asked him not to propel the wheelchair with his heels 1/13; in general his wounds look better not much surface area change we have been using Iodoflex as of last week. I did an x-ray of the right heel as the patient was complaining of pain. I had some thoughts about a stress fracture perhaps Achilles tendon problems however what it showed was erosive changes along the inferior aspect of the calcaneus he now has a MRI booked for 1/20. 1/20; in general his wounds continue to be better. Some improvement in the large narrow areas proximally in his foot. He is still complaining of pain in the right heel and tenderness in certain areas of this wound. His MRI is tonight. I am not just looking for osteomyelitis that was brought up on the x-ray I am wondering about stress fractures, tendon ruptures etc. He has no such findings on the left. Also noteworthy is that the patient had critical illness neuropathy and some of the discomfort may be actual improvement in nerve function I am just not sure. These wounds were initially in the setting of severe critical illness related to COVID-19. He was put in a standing position. He may have also been on pressors at the point contributing to tissue ischemia. By his description at some point these wounds were grossly necrotic extending proximally up into the Achilles part of his heel. I do not know that I have ever really seen pictures of them like this although they may exist in epic We have ordered Tri layer Oasis. I am trying to stimulate some granulation in these areas. This is of course assuming the MRI is negative for infection 1/27; since the patient was last here he saw Dr. Juleen China of infectious disease. He is planned for vancomycin and ceftriaxone. Prior operative culture grew MSSA. Also ordered baseline lab work. He also ordered arterial studies although the  ABIs in our clinic were normal as well as his clinical exam these were normal I do not think he needs to see vascular surgery. His ABIs at the PTA were 1.22 in the right triphasic waveforms with a normal TBI of 1.15 on the left ABI of 1.22 with triphasic waveforms and a normal TBI of 1.08. Finally he saw Dr. Amalia Hailey who will follow him in for 2 months. At this point I do not think he felt that he needed a procedure on the right calcaneal bone. Dr. Juleen China is elected for broad-spectrum antibiotic The patient is still having pain in the right heel. He walks with a walker 2/3; wounds are generally smaller. He is tolerating his IV antibiotics. I believe this is vancomycin and ceftriaxone. We are still waiting for Oasis burn in terms of his out-of-pocket max which he should be meeting soon given the IV antibiotics, MRIs etc. I have asked him to check in on this. We are using silver collagen in the meantime the wounds look better 2/10; tolerating IV vancomycin and Rocephin. We are waiting to apply for Oasis. Although I am not really sure where he is in his out-of-pocket max. 2/17 started the first application of Oasis trilayer. Still on antibiotics. The wounds have generally look better. The area on the left has a little more surface slough requiring debridement 3/33; second application of Oasis trilayer.  The wound surface granulation is generally look better. The area on the left with undermining laterally I think is come in a bit. 10/08/2020 upon evaluation today patient is here today for [employer] application #3. Fortunately he seems to be doing extremely well with regard to this and we are seeing a lot of new epithelial growth which is great news. Fortunately there is no signs of active infection at this time. 10/16/2020 upon evaluation today patient appears to be doing well with regard to his foot ulcers. Do believe the Oasis has been of benefit for him. I do not see any signs of infection right now  which is great news and I think that he has a lot of new epithelial growth which is great to see as well. The patient is very pleased to hear all of this. I do think we can proceed with the Oasis trilayer #4 today. 3/18; not as much improvement in these areas on his heels that I was hoping. I did reapply trilateral Oasis today the tissue looks healthier but not as much fill in as I was hoping. 3/25; better looking today I think this is come in a bit the tissue looks healthier. Triple layer Oasis reapplied #6 4/1; somewhat better looking definitely better looking surface not as much change in surface area as I was hoping. He may be spending more time [place] on days then he needs to although he does have heel offloading boots. Triple layer Oasis reapplied #7 4/7; unfortunately apparently [facility] will not approve any further Oasis which is unfortunate since the patient did respond nicely both in terms of the condition of the wound bed as well as surface area. There is still some drainage coming from the wound but not a lot there does not appear to be any infection 4/15; we have been using Hydrofera Blue. He continues to have nice rims of epithelialization on the right greater than the left. The left the epithelialization is coming from the tip of his heel. There is moderate drainage. In this that concerns me about a total contact cast. There is no evidence of infection 4/29; patient has been using Hydrofera Blue with dressing changes. He has no complaints or issues today. 5/5; using Hydrofera Blue. I actually think that he looks marginally better than the last time I saw this 3 weeks ago. There are rims of epithelialization on the left thumb coming from the medial side on the right. Using Hydrofera Blue 5/12; using Hydrofera Blue. These continue to make improvements in surface area. His drainage was not listed as severe I therefore went ahead and put a cast on the left foot. Right foot we  will continue to dress his previous 5/16; back for first total contact cast change. He did not tolerate this particularly well cast injury on the anterior tibia among other issues. Difficulty sleeping. I talked him about this in some detail and afterwards is elected to continue. I told him I would like to have a cast on for 3 weeks to see if this is going to help at all. I think he agreed 5/19; I think the wound is better. There is no tunneling towards his midfoot. The undermining medially also looks better. He has a rim of new skin distally. I think we are making progress here. The area on the left also continues to look somewhat better to me using Hydrofera Blue. He has a list of complaints about the cast but none of them seem  serious 5/26; patient presents for 1 week follow-up. He has been using a total contact cast and tolerating this well. Hydrofera Blue is the main dressing used. He denies signs of infection. 6/2 Hydrofera Blue total contact cast on the left. These were large ulcers that formed in intensive care unit where the patient was recovering from [place]. May have had something to do with being ventilated in an upright positiono Pressors etc. We have been able to get the areas down considerably and a viable surface. There is some epithelialization in both sides. Note made of drainage 6/9; changed to [facility] last time because of drainage. He arrives with better looking surfaces and dimensions on the left than the right. Paradoxically the right actually probes more towards his midfoot the left is largely close down but both of these look improved. Using a total contact cast on the left 6/16; complex wounds on his bilateral plantar heels which were initially pressure injury from a stay in the ICU with COVID. We have been using silver alginate most recently. His dimensions of come in quite dramatically however not recently. We have been putting the left foot in a total contact  cast 6/23; complex wounds on the bilateral plantar heels. I been putting the left in the cast paradoxically the area on the right is the one that is going towards closure at a faster rate. Quite a bit of drainage on the left. The patient went to see Dr. Amalia Hailey who said he was going to standby for skin grafts. I had actually considered sending him for skin grafts however he would be mandatorily off his feet for a period of weeks to months. I am thinking that the area on the right is going to close on its own the area on the left has been more stubborn even though we have him in a total contact cast 6/30; took him out of a total contact cast last week is the right heel seem to be making better progress than the left where I was placing the cast. We are using silver alginate. Both wounds are smaller right greater than left 7/12; both wounds look as though they are making some progress. We are using silver alginate. Heel offloading boots 7/26; very gradual progress especially on the right. Using silver alginate. He is wearing heel offloading boots 8/18; he continues to close these wounds down very gradually. Using silver alginate. The problem polymen being definitive about this is areas of what appears to be callus around the margins. This is not a 100% of the area but certainly sizable especially on the right 9/1; bilateral plantar feet wounds secondary to prolonged pressure while being ventilated for COVID-19 in an upright position. Essentially pressure ulcers on the bottom of his feet. He is made substantial progress using silver alginate. 9/14; bilateral plantar feet wounds secondary to prolonged pressure. Making progress using silver alginate. 9/29 bilateral plantar feet wounds secondary to prolonged pressure. I changed him to Iodoflex last week. MolecuLight showing reddened blush fluorescence 10/11; patient presents for follow-up. He has no issues or complaints today. He denies signs of infection.  He continues to use Iodoflex and antibiotic ointment to the wound beds. Electronic Signature(s) Signed: 05/18/2021 10:42:47 AM By: Kalman Shan DO Entered By: Kalman Shan on 05/18/2021 10:40:03 -------------------------------------------------------------------------------- Physical Exam Details Patient Name: Date of Service: [name], [place] 05/18/2021 9:45 A M Medical Record Number: [id number] Patient Account Number: [id number] Date of Birth/Sex: Treating RN: 1975-10-13 (45 y.o. M) Rolin Barry, [name]  Primary Care Provider: [name] Other Clinician: Referring Provider: Treating Provider/Extender: [name] in Treatment: 72 Constitutional respirations regular, non-labored and within target range for patient.. Cardiovascular 2+ dorsalis pedis/posterior tibialis pulses. Psychiatric pleasant and cooperative. Notes Bilateral plantar heel wounds with callus and granulation tissue present. No signs of surrounding infection. Electronic Signature(s) Signed: 05/18/2021 10:42:47 AM By: Kalman Shan DO Entered By: Kalman Shan on 05/18/2021 10:40:49 -------------------------------------------------------------------------------- Physician Orders Details Patient Name: Date of Service: [name], [place] 05/18/2021 9:45 A M Medical Record Number: [id number] Patient Account Number: [id number] Date of Birth/Sex: Treating RN: 18-Sep-1975 (45 y.o. [name], [name] Primary Care Provider: [name] Other Clinician: Referring Provider: Treating Provider/Extender: [name] in Treatment: 33 Verbal / Phone Orders: No Diagnosis Coding ICD-10 Coding Code Description [phone] Non-pressure chronic ulcer of other part of right foot with fat layer exposed L89.893 Pressure ulcer of other site, stage 3 L97.522 Non-pressure chronic ulcer of other 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 Follow-up Appointments ppointment in 2 weeks. -  with Dr. Dellia Nims Return A Bathing/ Shower/ Hygiene May shower with protection but do not get wound dressing(s) wet. Edema Control - Lymphedema / SCD / Other Bilateral Lower Extremities Elevate legs to the level of the heart or above for 30 minutes daily and/or when sitting, a frequency of: - throughout the day Avoid standing for long periods of time. Moisturize legs daily. - right leg and foot every night. Off-Loading Wedge shoe to: - Glophed Shoe to both feet. Other: - keep pressure off of the bottom of your feet Additional Orders / Instructions Follow Nutritious Diet Wound Treatment Wound #1 - Calcaneus Wound Laterality: Right Cleanser: Normal Saline (Generic) Every Other Day/30 Days Discharge Instructions: Cleanse the wound with Normal Saline prior to applying a clean dressing using gauze sponges, not tissue or cotton balls. Cleanser: Wound Cleanser Every Other Day/30 Days Discharge Instructions: Cleanse the wound with wound cleanser prior to applying a clean dressing using gauze sponges, not tissue or cotton balls. Topical: Mupirocin Ointment Every Other Day/30 Days Discharge Instructions: Apply Mupirocin (Bactroban) thin layer to wound bed and wound edges Prim Dressing: Iodosorb Gel 10 (gm) Tube (Generic) Every Other Day/30 Days ary Discharge Instructions: Apply to wound bed as instructed Secondary Dressing: Woven Gauze Sponge, Non-Sterile 4x4 in Every Other Day/30 Days Discharge Instructions: Apply over primary dressing as directed. Secondary Dressing: Zetuvit Plus 4x8 in (Generic) Every Other Day/30 Days Discharge Instructions: Apply over primary dressing as directed. Secured With: Elastic Bandage 4 inch (ACE bandage) Every Other Day/30 Days Discharge Instructions: Secure with ACE bandage as directed. Secured With: [employer], 4.5x3.1 (in/yd) (Generic) Every Other Day/30 Days Discharge Instructions: Secure with Kerlix as directed. Wound #2 - Calcaneus Wound Laterality:  Left Cleanser: Normal Saline (Generic) Every Other Day/30 Days Discharge Instructions: Cleanse the wound with Normal Saline prior to applying a clean dressing using gauze sponges, not tissue or cotton balls. Cleanser: Wound Cleanser Every Other Day/30 Days Discharge Instructions: Cleanse the wound with wound cleanser prior to applying a clean dressing using gauze sponges, not tissue or cotton balls. Topical: Mupirocin Ointment Every Other Day/30 Days Discharge Instructions: Apply Mupirocin (Bactroban) thin layer to wound bed and wound edges Prim Dressing: Iodosorb Gel 10 (gm) Tube (Generic) Every Other Day/30 Days ary Discharge Instructions: Apply to wound bed as instructed Secondary Dressing: Woven Gauze Sponge, Non-Sterile 4x4 in Every Other Day/30 Days Discharge Instructions: Apply over primary dressing as directed. Secondary Dressing: Zetuvit  Plus 4x8 in (Generic) Every Other Day/30 Days Discharge Instructions: Apply over primary dressing as directed. Secured With: Elastic Bandage 4 inch (ACE bandage) Every Other Day/30 Days Discharge Instructions: Secure with ACE bandage as directed. Secured With: [employer], 4.5x3.1 (in/yd) (Generic) Every Other Day/30 Days Discharge Instructions: Secure with Kerlix as directed. Electronic Signature(s) Signed: 05/18/2021 10:42:47 AM By: Kalman Shan DO Entered By: Kalman Shan on 05/18/2021 10:41:07 -------------------------------------------------------------------------------- Problem List Details Patient Name: Date of Service: [name], [place] 05/18/2021 9:45 A M Medical Record Number: [id number] Patient Account Number: [id number] Date of Birth/Sex: Treating RN: [birth year] (45 y.o. [name] Primary Care Provider: [name] Other Clinician: Referring Provider: Treating Provider/Extender: [name] in Treatment: 43 Active Problems ICD-10 Encounter Code Description Active Date  MDM Diagnosis L97.512 Non-pressure chronic ulcer of other part of right foot with fat layer exposed 09/03/2020 No Yes L89.893 Pressure ulcer of other site, stage 3 07/15/2020 No Yes L97.522 Non-pressure chronic ulcer of other 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 09/03/2020 No Yes Inactive Problems ICD-10 Code Description Active Date Inactive Date M62.81 Muscle weakness (generalized) 07/15/2020 07/15/2020 I10 Essential (primary) hypertension 07/15/2020 07/15/2020 M86.171 Other acute osteomyelitis, right ankle and foot 09/03/2020 09/03/2020 Resolved Problems Electronic Signature(s) Signed: 05/18/2021 10:42:47 AM By: Kalman Shan DO Entered By: Kalman Shan on 05/18/2021 10:39:21 -------------------------------------------------------------------------------- Progress Note Details Patient Name: Date of Service: [name], [place] 05/18/2021 9:45 A M Medical Record Number: [id number] Patient Account Number: [id number] Date of Birth/Sex: Treating RN: 1975/08/18 (45 y.o. [name] Primary Care Provider: [name] Other Clinician: Referring Provider: Treating Provider/Extender: [name] in Treatment: 69 Subjective Chief Complaint Information obtained from Patient Bilateral Plantar Foot Ulcers History of Present Illness (HPI) Wounds are12/03/2020 upon evaluation today patient presents for initial inspection here in our clinic concerning issues he has been having with the bottoms of his feet bilaterally. He states these actually occurred as wounds when he was hospitalized for 5 months secondary to Covid. He was apparently with tilting bed where he was in an upright position quite frequently and apparently this occurred in some way shape or form during that time. Fortunately there is no sign of active infection at this time. No fevers, chills, nausea, vomiting, or diarrhea. With that being said he still has substantial wounds on the plantar aspects of  his feet Theragen require quite a bit of work to get these to heal. He has been using Santyl currently though that is been problematic both in receiving the medication as well as actually paid for it as it is become quite expensive. Prior to the experience with Covid the patient really did not have any major medical problems other than hypertension he does have some mild generalized weakness following the Covid experience. 07/22/2020 on evaluation today patient appears to be doing okay in regard to his foot ulcers I feel like the wound beds are showing signs of better improvement that I do believe the Iodoflex is helping in this regard. With that being said he does have a lot of drainage currently and this is somewhat blue/green in nature which is consistent with Pseudomonas. I do think a culture today would be appropriate for [place] to evaluate and see if that is indeed the case I would likely start him on antibiotic orally as well he is not allergic to Cipro knows of no issues he has had in the past 12/21; patient was admitted to the  clinic earlier this month with bilateral presumed pressure ulcers on the bottom of his feet apparently related to excessive pressure from a tilt table arrangement in the intensive care unit. Patient relates this to being on ECMO but I am not really sure that is exactly related to that. I must say I have never seen anything like this. He has fairly extensive full-thickness wounds extending from his heel towards his midfoot mostly centered laterally. There is already been some healing distally. He does not appear to have an arterial issue. He has been using gentamicin to the wound surfaces with Iodoflex to help with ongoing debridement 1/6; this is a patient with pressure ulcers on the bottom of his feet related to excessive pressure from a standing position in the intensive care unit. He is complaining of a lot of pain in the right heel. He is not a diabetic. 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 We have been using Iodoflex to help prepare the surfaces of both wounds for an advanced treatment product. He is nonambulatory spending most of his time in a wheelchair I have asked him not to propel the wheelchair with his heels 1/13; in general his wounds look better not much surface area change we have been using Iodoflex as of last week. I did an x-ray of the right heel as the patient was complaining of pain. I had some thoughts about a stress fracture perhaps Achilles tendon problems however what it showed was erosive changes along the inferior aspect of the calcaneus he now has a MRI booked for 1/20. 1/20; in general his wounds continue to be better. Some improvement in the large narrow areas proximally in his foot. He is still complaining of pain in the right heel and tenderness in certain areas of this wound. His MRI is tonight. I am not just looking for osteomyelitis that was brought up on the x-ray I am wondering about stress fractures, tendon ruptures etc. He has no such findings on the left. Also noteworthy is that the patient had critical illness neuropathy and some of the discomfort may be actual improvement in nerve function I am just not sure. These wounds were initially in the setting of severe critical illness related to COVID-19. He was put in a standing position. He may have also been on pressors at the point contributing to tissue ischemia. By his description at some point these wounds were grossly necrotic extending proximally up into the Achilles part of his heel. I do not know that I have ever really seen pictures of them like this although they may exist in epic We have ordered Tri layer Oasis. I am trying to stimulate some granulation in these areas. This is of course assuming the MRI is negative for infection 1/27; since the patient was last here he saw Dr. Juleen China of infectious disease. He is planned for vancomycin and  ceftriaxone. Prior operative culture grew MSSA. Also ordered baseline lab work. He also ordered arterial studies although the ABIs in our clinic were normal as well as his clinical exam these were normal I do not think he needs to see vascular surgery. His ABIs at the P[facility] were 1.22 in the right triphasic waveforms with a normal TBI of 1.15 on the left ABI of 1.22 with triphasic waveforms and a normal TBI of 1.08. Finally he saw Dr. Amalia Hailey who will follow him in for 2 months. At this point I do not think he felt that he needed a  procedure on the right calcaneal bone. Dr. Juleen China is elected for broad-spectrum antibiotic The patient is still having pain in the right heel. He walks with a walker 2/3; wounds are generally smaller. He is tolerating his IV antibiotics. I believe this is vancomycin and ceftriaxone. We are still waiting for Oasis burn in terms of his out-of-pocket max which he should be meeting soon given the IV antibiotics, MRIs etc. I have asked him to check in on this. We are using silver collagen in the meantime the wounds look better 2/10; tolerating IV vancomycin and Rocephin. We are waiting to apply for Oasis. Although I am not really sure where he is in his out-of-pocket max. 2/17 started the first application of Oasis trilayer. Still on antibiotics. The wounds have generally look better. The area on the left has a little more surface slough requiring debridement 1/65; second application of Oasis trilayer. The wound surface granulation is generally look better. The area on the left with undermining laterally I think is come in a bit. 10/08/2020 upon evaluation today patient is here today for [employer] application #3. Fortunately he seems to be doing extremely well with regard to this and we are seeing a lot of new epithelial growth which is great news. Fortunately there is no signs of active infection at this time. 10/16/2020 upon evaluation today patient appears to be doing well  with regard to his foot ulcers. Do believe the Oasis has been of benefit for him. I do not see any signs of infection right now which is great news and I think that he has a lot of new epithelial growth which is great to see as well. The patient is very pleased to hear all of this. I do think we can proceed with the Oasis trilayer #4 today. 3/18; not as much improvement in these areas on his heels that I was hoping. I did reapply trilateral Oasis today the tissue looks healthier but not as much fill in as I was hoping. 3/25; better looking today I think this is come in a bit the tissue looks healthier. Triple layer Oasis reapplied #6 4/1; somewhat better looking definitely better looking surface not as much change in surface area as I was hoping. He may be spending more time [place] on days then he needs to although he does have heel offloading boots. Triple layer Oasis reapplied #7 4/7; unfortunately apparently [facility] will not approve any further Oasis which is unfortunate since the patient did respond nicely both in terms of the condition of the wound bed as well as surface area. There is still some drainage coming from the wound but not a lot there does not appear to be any infection 4/15; we have been using Hydrofera Blue. He continues to have nice rims of epithelialization on the right greater than the left. The left the epithelialization is coming from the tip of his heel. There is moderate drainage. In this that concerns me about a total contact cast. There is no evidence of infection 4/29; patient has been using Hydrofera Blue with dressing changes. He has no complaints or issues today. 5/5; using Hydrofera Blue. I actually think that he looks marginally better than the last time I saw this 3 weeks ago. There are rims of epithelialization on the left thumb coming from the medial side on the right. Using Hydrofera Blue 5/12; using Hydrofera Blue. These continue to make  improvements in surface area. His drainage was not listed as severe  I therefore went ahead and put a cast on the left foot. Right foot we will continue to dress his previous 5/16; back for first total contact cast change. He did not tolerate this particularly well cast injury on the anterior tibia among other issues. Difficulty sleeping. I talked him about this in some detail and afterwards is elected to continue. I told him I would like to have a cast on for 3 weeks to see if this is going to help at all. I think he agreed 5/19; I think the wound is better. There is no tunneling towards his midfoot. The undermining medially also looks better. He has a rim of new skin distally. I think we are making progress here. The area on the left also continues to look somewhat better to me using Hydrofera Blue. He has a list of complaints about the cast but none of them seem serious 5/26; patient presents for 1 week follow-up. He has been using a total contact cast and tolerating this well. Hydrofera Blue is the main dressing used. He denies signs of infection. 6/2 Hydrofera Blue total contact cast on the left. These were large ulcers that formed in intensive care unit where the patient was recovering from [place]. May have had something to do with being ventilated in an upright positiono Pressors etc. We have been able to get the areas down considerably and a viable surface. There is some epithelialization in both sides. Note made of drainage 6/9; changed to [facility] last time because of drainage. He arrives with better looking surfaces and dimensions on the left than the right. Paradoxically the right actually probes more towards his midfoot the left is largely close down but both of these look improved. Using a total contact cast on the left 6/16; complex wounds on his bilateral plantar heels which were initially pressure injury from a stay in the ICU with COVID. We have been using silver alginate most  recently. His dimensions of come in quite dramatically however not recently. We have been putting the left foot in a total contact cast 6/23; complex wounds on the bilateral plantar heels. I been putting the left in the cast paradoxically the area on the right is the one that is going towards closure at a faster rate. Quite a bit of drainage on the left. The patient went to see Dr. Amalia Hailey who said he was going to standby for skin grafts. I had actually considered sending him for skin grafts however he would be mandatorily off his feet for a period of weeks to months. I am thinking that the area on the right is going to close on its own the area on the left has been more stubborn even though we have him in a total contact cast 6/30; took him out of a total contact cast last week is the right heel seem to be making better progress than the left where I was placing the cast. We are using silver alginate. Both wounds are smaller right greater than left 7/12; both wounds look as though they are making some progress. We are using silver alginate. Heel offloading boots 7/26; very gradual progress especially on the right. Using silver alginate. He is wearing heel offloading boots 8/18; he continues to close these wounds down very gradually. Using silver alginate. The problem polymen being definitive about this is areas of what appears to be callus around the margins. This is not a 100% of the area but certainly sizable especially on the right  9/1; bilateral plantar feet wounds secondary to prolonged pressure while being ventilated for COVID-19 in an upright position. Essentially pressure ulcers on the bottom of his feet. He is made substantial progress using silver alginate. 9/14; bilateral plantar feet wounds secondary to prolonged pressure. Making progress using silver alginate. 9/29 bilateral plantar feet wounds secondary to prolonged pressure. I changed him to Iodoflex last week. MolecuLight showing  reddened blush fluorescence 10/11; patient presents for follow-up. He has no issues or complaints today. He denies signs of infection. He continues to use Iodoflex and antibiotic ointment to the wound beds. Patient History Information obtained from Patient. Family History Cancer - Maternal Grandparents, Diabetes - Father,Paternal Grandparents, Heart Disease - Maternal Grandparents, Hypertension - Father,Paternal Grandparents, Lung Disease - Siblings, No family history of Hereditary Spherocytosis, Kidney Disease, Seizures, Stroke, Thyroid Problems, Tuberculosis. Social History Never smoker, Marital Status - Married, Alcohol Use - Never, Drug Use - No History, Caffeine Use - Daily - tea, soda. Medical History Eyes Denies history of Cataracts, Glaucoma, Optic Neuritis Ear/Nose/Mouth/Throat Denies history of Chronic sinus problems/congestion, Middle ear problems Hematologic/Lymphatic Denies history of Anemia, Hemophilia, Human Immunodeficiency Virus, Lymphedema, Sickle Cell Disease Respiratory Patient has history of Asthma Denies history of Aspiration, Chronic Obstructive Pulmonary Disease (COPD), Pneumothorax, Sleep Apnea, Tuberculosis Cardiovascular Patient has history of Angina - with COVID, Hypertension Denies history of Arrhythmia, Congestive Heart Failure, Coronary Artery Disease, Deep Vein Thrombosis, Hypotension, Myocardial Infarction, Peripheral Arterial Disease, Peripheral Venous Disease, Phlebitis, Vasculitis Gastrointestinal Denies history of Cirrhosis , Colitis, Crohnoos, Hepatitis A, Hepatitis B, Hepatitis C Endocrine Denies history of Type I Diabetes, Type II Diabetes Genitourinary Denies history of End Stage Renal Disease Immunological Denies history of Lupus Erythematosus, Raynaudoos, Scleroderma Integumentary (Skin) Denies history of History of Burn Musculoskeletal Denies history of Gout, Rheumatoid Arthritis, Osteoarthritis, Osteomyelitis Neurologic Denies  history of Dementia, Neuropathy, Quadriplegia, Paraplegia, Seizure Disorder Oncologic Denies history of Received Chemotherapy, Received Radiation Psychiatric Denies history of Anorexia/bulimia, Confinement Anxiety Hospitalization/Surgery History - COVID PNA 07/22/2019- 11/14/2019. - 03/27/2020 wound debridement/ skin graft. Medical A Surgical History Notes nd Constitutional Symptoms (General Health) COVID PNA 07/22/2019-11/14/2019 VENT ECMO, foot drop left foot , Genitourinary kidney stone Psychiatric anxiety Objective Constitutional respirations regular, non-labored and within target range for patient.. Vitals Time Taken: 9:35 AM, Height: 69 in, Weight: 280 lbs, BMI: 41.3, Temperature: 98.5 F, Pulse: 84 bpm, Respiratory Rate: 17 breaths/min, Blood Pressure: 159/98 mmHg. Cardiovascular 2+ dorsalis pedis/posterior tibialis pulses. Psychiatric pleasant and cooperative. General Notes: Bilateral plantar heel wounds with callus and granulation tissue present. No signs of surrounding infection. Integumentary (Hair, Skin) Wound #1 status is Open. Original cause of wound was Pressure Injury. The date acquired was: 10/07/2019. The wound has been in treatment 43 weeks. The wound is located on the Right Calcaneus. The wound measures 2cm length x 0.5cm width x 0.3cm depth; 0.785cm^2 area and 0.236cm^3 volume. There is Fat Layer (Subcutaneous Tissue) exposed. There is no tunneling noted, however, there is undermining starting at 6:00 and ending at 12:00 with a maximum distance of 0.3cm. There is a medium amount of serosanguineous drainage noted. The wound margin is thickened. There is large (67-100%) red, pink granulation within the wound bed. There is a small (1-33%) amount of necrotic tissue within the wound bed including Adherent Slough. Wound #2 status is Open. Original cause of wound was Pressure Injury. The date acquired was: 10/07/2019. The wound has been in treatment 43 weeks. The wound is  located on the Left Calcaneus. The wound measures 3.7cm length x  1cm width x 0.3cm depth; 2.906cm^2 area and 0.872cm^3 volume. There is Fat Layer (Subcutaneous Tissue) exposed. There is no tunneling or undermining noted. There is a medium amount of serosanguineous drainage noted. The wound margin is thickened. There is large (67-100%) red, pink granulation within the wound bed. There is a small (1-33%) amount of necrotic tissue within the wound bed including Adherent Slough. Assessment Active Problems ICD-10 Non-pressure chronic ulcer of other part of right foot with fat layer exposed Pressure ulcer of other site, stage 3 Non-pressure chronic ulcer of other 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 Patient follows closely with Dr. Dellia Nims. Patient's wounds have shown improvement in size since last clinic visit. No signs of infection on exam. I recommended continuing Iodoflex with mupirocin ointment. I recommended aggressive offloading. Plan Follow-up Appointments: Return Appointment in 2 weeks. - with Dr. Arcola Jansky Shower/ Hygiene: May shower with protection but do not get wound dressing(s) wet. Edema Control - Lymphedema / SCD / Other: Elevate legs to the level of the heart or above for 30 minutes daily and/or when sitting, a frequency of: - throughout the day Avoid standing for long periods of time. Moisturize legs daily. - right leg and foot every night. Off-Loading: Wedge shoe to: - Glophed Shoe to both feet. Other: - keep pressure off of the bottom of your feet Additional Orders / Instructions: Follow Nutritious Diet WOUND #1: - Calcaneus Wound Laterality: Right Cleanser: Normal Saline (Generic) Every Other Day/30 Days Discharge Instructions: Cleanse the wound with Normal Saline prior to applying a clean dressing using gauze sponges, not tissue or cotton balls. Cleanser: Wound Cleanser Every Other Day/30 Days Discharge Instructions: Cleanse the wound with wound cleanser prior  to applying a clean dressing using gauze sponges, not tissue or cotton balls. Topical: Mupirocin Ointment Every Other Day/30 Days Discharge Instructions: Apply Mupirocin (Bactroban) thin layer to wound bed and wound edges Prim Dressing: Iodosorb Gel 10 (gm) Tube (Generic) Every Other Day/30 Days ary Discharge Instructions: Apply to wound bed as instructed Secondary Dressing: Woven Gauze Sponge, Non-Sterile 4x4 in Every Other Day/30 Days Discharge Instructions: Apply over primary dressing as directed. Secondary Dressing: Zetuvit Plus 4x8 in (Generic) Every Other Day/30 Days Discharge Instructions: Apply over primary dressing as directed. Secured With: Elastic Bandage 4 inch (ACE bandage) Every Other Day/30 Days Discharge Instructions: Secure with ACE bandage as directed. Secured With: [employer], 4.5x3.1 (in/yd) (Generic) Every Other Day/30 Days Discharge Instructions: Secure with Kerlix as directed. WOUND #2: - Calcaneus Wound Laterality: Left Cleanser: Normal Saline (Generic) Every Other Day/30 Days Discharge Instructions: Cleanse the wound with Normal Saline prior to applying a clean dressing using gauze sponges, not tissue or cotton balls. Cleanser: Wound Cleanser Every Other Day/30 Days Discharge Instructions: Cleanse the wound with wound cleanser prior to applying a clean dressing using gauze sponges, not tissue or cotton balls. Topical: Mupirocin Ointment Every Other Day/30 Days Discharge Instructions: Apply Mupirocin (Bactroban) thin layer to wound bed and wound edges Prim Dressing: Iodosorb Gel 10 (gm) Tube (Generic) Every Other Day/30 Days ary Discharge Instructions: Apply to wound bed as instructed Secondary Dressing: Woven Gauze Sponge, Non-Sterile 4x4 in Every Other Day/30 Days Discharge Instructions: Apply over primary dressing as directed. Secondary Dressing: Zetuvit Plus 4x8 in (Generic) Every Other Day/30 Days Discharge Instructions: Apply over primary dressing as  directed. Secured With: Elastic Bandage 4 inch (ACE bandage) Every Other Day/30 Days Discharge Instructions: Secure with ACE bandage as directed. Secured With: [employer], 4.5x3.1 (in/yd) (Generic)  Every Other Day/30 Days Discharge Instructions: Secure with Kerlix as directed. 1. Iodoflex with mupirocin ointment 2. Aggressive offloading 3. Follow-up in 2 weeks Electronic Signature(s) Signed: 05/18/2021 10:42:47 AM By: Kalman Shan DO Entered By: Kalman Shan on 05/18/2021 10:42:15 -------------------------------------------------------------------------------- HxROS Details Patient Name: Date of Service: [name], [place] 05/18/2021 9:45 A M Medical Record Number: [id number] Patient Account Number: [id number] Date of Birth/Sex: Treating RN: 1975-08-31 (45 y.o. [name] Primary Care Provider: [name] Other Clinician: Referring Provider: Treating Provider/Extender: [name] in Treatment: 17 Information Obtained From Patient Constitutional Symptoms (General Health) Medical History: Past Medical History Notes: COVID PNA 07/22/2019-11/14/2019 VENT ECMO, foot drop left foot , Eyes Medical History: Negative for: Cataracts; Glaucoma; Optic Neuritis Ear/Nose/Mouth/Throat Medical History: Negative for: Chronic sinus problems/congestion; Middle ear problems Hematologic/Lymphatic Medical History: Negative for: Anemia; Hemophilia; Human Immunodeficiency Virus; Lymphedema; Sickle Cell Disease Respiratory Medical History: Positive for: Asthma Negative for: Aspiration; Chronic Obstructive Pulmonary Disease (COPD); Pneumothorax; Sleep Apnea; Tuberculosis Cardiovascular Medical History: Positive for: Angina - with COVID; Hypertension Negative for: Arrhythmia; Congestive Heart Failure; Coronary Artery Disease; Deep Vein Thrombosis; Hypotension; Myocardial Infarction; Peripheral Arterial Disease; Peripheral Venous Disease; Phlebitis;  Vasculitis Gastrointestinal Medical History: Negative for: Cirrhosis ; Colitis; Crohns; Hepatitis A; Hepatitis B; Hepatitis C Endocrine Medical History: Negative for: Type I Diabetes; Type II Diabetes Genitourinary Medical History: Negative for: End Stage Renal Disease Past Medical History Notes: kidney stone Immunological Medical History: Negative for: Lupus Erythematosus; Raynauds; Scleroderma Integumentary (Skin) Medical History: Negative for: History of Burn Musculoskeletal Medical History: Negative for: Gout; Rheumatoid Arthritis; Osteoarthritis; Osteomyelitis Neurologic Medical History: Negative for: Dementia; Neuropathy; Quadriplegia; Paraplegia; Seizure Disorder Oncologic Medical History: Negative for: Received Chemotherapy; Received Radiation Psychiatric Medical History: Negative for: Anorexia/bulimia; Confinement Anxiety Past Medical History Notes: anxiety Immunizations Pneumococcal Vaccine: Received Pneumococcal Vaccination: No Implantable Devices None Hospitalization / Surgery History Type of Hospitalization/Surgery COVID PNA 07/22/2019- 11/14/2019 03/27/2020 wound debridement/ skin graft Family and Social History Cancer: Yes - Maternal Grandparents; Diabetes: Yes - Father,Paternal Grandparents; Heart Disease: Yes - Maternal Grandparents; Hereditary Spherocytosis: No; Hypertension: Yes - Father,Paternal Grandparents; Kidney Disease: No; Lung Disease: Yes - Siblings; Seizures: No; Stroke: No; Thyroid Problems: No; Tuberculosis: No; Never smoker; Marital Status - Married; Alcohol Use: Never; Drug Use: No History; Caffeine Use: Daily - tea, soda; Financial Concerns: No; Food, Clothing or Shelter Needs: No; Support System Lacking: No; Transportation Concerns: No Electronic Signature(s) Signed: 05/18/2021 10:42:47 AM By: Kalman Shan DO Signed: 05/18/2021 5:07:29 PM By: [name] RN, BSN Entered By: Kalman Shan on 05/18/2021  10:40:10 -------------------------------------------------------------------------------- SuperBill Details Patient Name: Date of Service: [name], [place]. 05/18/2021 Medical Record Number: [id number] Patient Account Number: [id number] Date of Birth/Sex: Treating RN: 06/30/1976 (45 y.o. [name] Primary Care Provider: [name] Other Clinician: Referring Provider: Treating Provider/Extender: [name] in Treatment: 5 Diagnosis Coding ICD-10 Codes Code Description [phone] Non-pressure chronic ulcer of other part of right foot with fat layer exposed L89.893 Pressure ulcer of other site, stage 3 L97.522 Non-pressure chronic ulcer of other 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 Facility Procedures CPT4 Code: [id number] Description: [phone] - WOUND CARE VISIT-LEV 5 EST PT Modifier: Quantity: 1 Physician Procedures : CPT4 Code Description Modifier [id number] - WC PHYS LEVEL 3 - EST PT ICD-10 Diagnosis Description L97.512 Non-pressure chronic ulcer of other part of right foot with fat layer exposed L89.893 Pressure ulcer of other site, stage 3 L97.522  Non-pressure chronic ulcer of  other 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 Quantity: 1 Electronic Signature(s) Signed: 05/20/2021 5:14:54 PM By: [name] RN, BSN Signed: 06/11/2021 2:44:39 PM By: Kalman Shan DO Previous Signature: 05/18/2021 10:42:47 AM Version By: Kalman Shan DO Entered By: [name] on 05/20/2021 17:14:53

## 2021-05-19 DIAGNOSIS — R7303 Prediabetes: Secondary | ICD-10-CM | POA: Diagnosis not present

## 2021-05-19 NOTE — Progress Notes (Addendum)
[name], [name] ([id number]) Visit Report for 05/18/2021 Arrival Information Details Patient Name: Date of Service: [name], [place] 05/18/2021 9:45 A M Medical Record Number: [id number] Patient Account Number: [id number] Date of Birth/Sex: Treating RN: Dec 03, 1975 (45 y.o. [name], [name] Primary Care Saria Haran: [name] Other Clinician: Referring Taneisha Fuson: Treating Emmer Lillibridge/Extender: [name] in Treatment: 87 Visit Information History Since Last Visit Added or deleted any medications: No Patient Arrived: Wheel Chair Any new allergies or adverse reactions: No Arrival Time: 09:34 Had a fall or experienced change in No Accompanied By: wife activities of daily living that may affect Transfer Assistance: Manual risk of falls: Patient Identification Verified: Yes Signs or symptoms of abuse/neglect since last visito No Secondary Verification Process Completed: Yes Hospitalized since last visit: No Patient Requires Transmission-Based Precautions: No Implantable device outside of the clinic excluding No Patient Has Alerts: No cellular tissue based products placed in the center since last visit: Has Dressing in Place as Prescribed: Yes Pain Present Now: Yes Electronic Signature(s) Signed: 05/18/2021 5:02:53 PM By: [name] RN Entered By: [name] on 05/18/2021 09:34:38 -------------------------------------------------------------------------------- Clinic Level of Care Assessment Details Patient Name: Date of Service: [name], [name]. 05/18/2021 9:45 A M Medical Record Number: [id number] Patient Account Number: [id number] Date of Birth/Sex: Treating RN: 29-Mar-1976 (45 y.o. [name], [name] Primary Care Deane Wattenbarger: [name] Other Clinician: Referring [name]: Treating [name]/Extender: [name] in Treatment: 22 Clinic Level of Care Assessment Items TOOL 4 Quantity Score X- 1 0 Use when only an  EandM is performed on FOLLOW-UP visit ASSESSMENTS - Nursing Assessment / Reassessment X- 1 10 Reassessment of Co-morbidities (includes updates in patient status) X- 1 5 Reassessment of Adherence to Treatment Plan ASSESSMENTS - Wound and Skin A ssessment / Reassessment []  - 0 Simple Wound Assessment / Reassessment - one wound X- 2 5 Complex Wound Assessment / Reassessment - multiple wounds X- 1 10 Dermatologic / Skin Assessment (not related to wound area) ASSESSMENTS - Focused Assessment X- 2 5 Circumferential Edema Measurements - multi extremities X- 1 10 Nutritional Assessment / Counseling / Intervention []  - 0 Lower Extremity Assessment (monofilament, tuning fork, pulses) []  - 0 Peripheral Arterial Disease Assessment (using hand held doppler) ASSESSMENTS - Ostomy and/or Continence Assessment and Care []  - 0 Incontinence Assessment and Management []  - 0 Ostomy Care Assessment and Management (repouching, etc.) PROCESS - Coordination of Care []  - 0 Simple Patient / Family Education for ongoing care X- 1 20 Complex (extensive) Patient / Family Education for ongoing care X- 1 10 Staff obtains Programmer, systems, Records, T Results / Process Orders est []  - 0 Staff telephones HHA, Nursing Homes / Clarify orders / etc []  - 0 Routine Transfer to another Facility (non-emergent condition) []  - 0 Routine Hospital Admission (non-emergent condition) []  - 0 New Admissions / Biomedical engineer / Ordering NPWT Apligraf, etc. , []  - 0 Emergency Hospital Admission (emergent condition) []  - 0 Simple Discharge Coordination X- 1 15 Complex (extensive) Discharge Coordination PROCESS - Special Needs []  - 0 Pediatric / Minor Patient Management []  - 0 Isolation Patient Management []  - 0 Hearing / Language / Visual special needs []  - 0 Assessment of Community assistance (transportation, D/C planning, etc.) []  - 0 Additional assistance / Altered mentation []  - 0 Support Surface(s)  Assessment (bed, cushion, seat, etc.) INTERVENTIONS - Wound Cleansing / Measurement []  - 0 Simple Wound Cleansing - one wound X- 2 5 Complex Wound Cleansing - multiple  wounds X- 1 5 Wound Imaging (photographs - any number of wounds) []  - 0 Wound Tracing (instead of photographs) []  - 0 Simple Wound Measurement - one wound X- 2 5 Complex Wound Measurement - multiple wounds INTERVENTIONS - Wound Dressings []  - 0 Small Wound Dressing one or multiple wounds X- 2 15 Medium Wound Dressing one or multiple wounds []  - 0 Large Wound Dressing one or multiple wounds []  - 0 Application of Medications - topical []  - 0 Application of Medications - injection INTERVENTIONS - Miscellaneous []  - 0 External ear exam []  - 0 Specimen Collection (cultures, biopsies, blood, body fluids, etc.) []  - 0 Specimen(s) / Culture(s) sent or taken to Lab for analysis []  - 0 Patient Transfer (multiple staff / Civil Service fast streamer / Similar devices) []  - 0 Simple Staple / Suture removal (25 or less) []  - 0 Complex Staple / Suture removal (26 or more) []  - 0 Hypo / Hyperglycemic Management (close monitor of Blood Glucose) []  - 0 Ankle / Brachial Index (ABI) - do not check if billed separately X- 1 5 Vital Signs Has the patient been seen at the hospital within the last three years: Yes Total Score: 160 Level Of Care: New/Established - Level 5 Electronic Signature(s) Signed: 05/20/2021 5:46:14 PM By: [name] RN, BSN Entered By: [name] on 05/20/2021 17:14:42 -------------------------------------------------------------------------------- Encounter Discharge Information Details Patient Name: Date of Service: [name], [place] E. 05/18/2021 9:45 A M Medical Record Number: [id number] Patient Account Number: [id number] Date of Birth/Sex: Treating RN: Mar 16, 1976 (45 y.o. [name], [name] Primary Care Jamice Carreno: [name] Other Clinician: Referring Rashee Marschall: Treating [name]/Extender: [name] in Treatment: 33 Encounter Discharge Information Items Discharge Condition: Stable Ambulatory Status: Wheelchair Discharge Destination: Home Transportation: Private Auto Accompanied By: self Schedule Follow-up Appointment: Yes Clinical Summary of Care: Patient Declined Electronic Signature(s) Signed: 05/18/2021 5:02:53 PM By: [name] RN Entered By: [name] on 05/18/2021 11:14:39 -------------------------------------------------------------------------------- Lower Extremity Assessment Details Patient Name: Date of Service: [name], [place] 05/18/2021 9:45 A M Medical Record Number: [id number] Patient Account Number: [id number] Date of Birth/Sex: Treating RN: 1976/05/15 (45 y.o. [name], [name] Primary Care Ewart Carrera: [name] Other Clinician: Referring Celina Shiley: Treating Anndrea Mihelich/Extender: [name] in Treatment: 30 Edema Assessment Assessed: [name]: No] [name]: Yes] Edema: [Left: No] [Right: No] Calf Left: Right: Point of Measurement: 29 cm From Medial Instep 42 cm 42.5 cm Ankle Left: Right: Point of Measurement: 9 cm From Medial Instep 24.5 cm 24 cm Vascular Assessment Pulses: Dorsalis Pedis Palpable: [Left:Yes] [Right:Yes] Posterior Tibial Palpable: [Left:Yes] [Right:Yes] Electronic Signature(s) Signed: 05/18/2021 5:02:53 PM By: [name] RN Entered By: [name] on 05/18/2021 09:37:15 -------------------------------------------------------------------------------- Multi Wound Chart Details Patient Name: Date of Service: [name], [place] 05/18/2021 9:45 A M Medical Record Number: [id number] Patient Account Number: [id number] Date of Birth/Sex: Treating RN: 1976/01/21 (45 y.o. [name], [name] Primary Care Alishah Schulte: [name] Other Clinician: Referring Daphine Loch: Treating Medardo Hassing/Extender: [name] in Treatment: 66 Vital  Signs Height(in): 69 Pulse(bpm): 43 Weight(lbs): 280 Blood Pressure(mmHg): 159/98 Body Mass Index(BMI): 41 Temperature(F): 98.5 Respiratory Rate(breaths/min): 17 Photos: [N/A:N/A] Right Calcaneus Left Calcaneus N/A Wound Location: Pressure Injury Pressure Injury N/A Wounding Event: Pressure Ulcer Pressure Ulcer N/A Primary Etiology: Asthma, Angina, Hypertension Asthma, Angina, Hypertension N/A Comorbid History: 10/07/2019 10/07/2019 N/A Date Acquired: 12 43 N/A Weeks of Treatment: Open Open N/A Wound Status: 2x0.5x0.3 3.7x1x0.3 N/A Measurements L x W x D (cm) 0.785  2.906 N/A A (cm) : rea 0.236 0.872 N/A Volume (cm) : 97.50% 89.30% N/A % Reduction in A rea: 99.30% 96.80% N/A % Reduction in Volume: 6 Starting Position 1 (o'clock): 12 Ending Position 1 (o'clock): 0.3 Maximum Distance 1 (cm): Yes No N/A Undermining: Category/Stage III Category/Stage III N/A Classification: Medium Medium N/A Exudate A mount: Serosanguineous Serosanguineous N/A Exudate Type: red, brown red, brown N/A Exudate Color: Thickened Thickened N/A Wound Margin: Large (67-100%) Large (67-100%) N/A Granulation A mount: Red, Pink Red, Pink N/A Granulation Quality: Small (1-33%) Small (1-33%) N/A Necrotic A mount: Fat Layer (Subcutaneous Tissue): Yes Fat Layer (Subcutaneous Tissue): Yes N/A Exposed Structures: Fascia: No Fascia: No Tendon: No Tendon: No Muscle: No Muscle: No Joint: No Joint: No Bone: No Bone: No Small (1-33%) Small (1-33%) N/A Epithelialization: Treatment Notes Electronic Signature(s) Signed: 05/18/2021 10:42:47 AM By: Kalman Shan DO Signed: 05/18/2021 5:07:29 PM By: [name] RN, BSN Entered By: Kalman Shan on 05/18/2021 10:39:27 -------------------------------------------------------------------------------- Multi-Disciplinary Care Plan Details Patient Name: Date of Service: [name], [name] 05/18/2021 9:45 A M Medical Record Number:  [id number] Patient Account Number: [id number] Date of Birth/Sex: Treating RN: Mar 15, 1976 (45 y.o. [name], [name] Primary Care Tracyann Duffell: [name] Other Clinician: Referring Orvin Netter: Treating Jaevin Medearis/Extender: [name] in Treatment: 37 Multidisciplinary Care Plan reviewed with physician Active Inactive Abuse / Safety / Falls / Self Care Management Nursing Diagnoses: Potential for falls Goals: Patient/caregiver will verbalize/demonstrate measures taken to prevent injury and/or falls Date Initiated: 07/15/2020 Target Resolution Date: 06/03/2021 Goal Status: Active Interventions: Assess fall risk on admission and as needed Assess impairment of mobility on admission and as needed per policy Notes: Wound/Skin Impairment Nursing Diagnoses: Impaired tissue integrity Knowledge deficit related to ulceration/compromised skin integrity Goals: Patient/caregiver will verbalize understanding of skin care regimen Date Initiated: 07/15/2020 Target Resolution Date: 06/03/2021 Goal Status: Active Ulcer/skin breakdown will have a volume reduction of 30% by week 4 Date Initiated: 07/15/2020 Date Inactivated: 08/20/2020 Target Resolution Date: 09/03/2020 Goal Status: Unmet Unmet Reason: no major changes. Ulcer/skin breakdown will heal within 14 weeks Date Initiated: 12/04/2020 Date Inactivated: 12/10/2020 Target Resolution Date: 12/10/2020 Unmet Reason: wounds still open at 14 Goal Status: Unmet weeks and today 21 weeks. Interventions: Assess patient/caregiver ability to obtain necessary supplies Assess patient/caregiver ability to perform ulcer/skin care regimen upon admission and as needed Assess ulceration(s) every visit Provide education on ulcer and skin care Treatment Activities: Skin care regimen initiated : 07/15/2020 Topical wound management initiated : 07/15/2020 Notes: Electronic Signature(s) Signed: 05/18/2021 5:02:53 PM By: [name]  RN Entered By: [name] on 05/18/2021 09:47:44 -------------------------------------------------------------------------------- Pain Assessment Details Patient Name: Date of Service: [name], [place]. 05/18/2021 9:45 A M Medical Record Number: [id number] Patient Account Number: [id number] Date of Birth/Sex: Treating RN: February 16, 1976 (45 y.o. [name] Primary Care [place] Nabor: [name] Other Clinician: Referring Jerriah Ines: Treating Kariah Loredo/Extender: [name] in Treatment: 85 Active Problems Location of Pain Severity and Description of Pain Patient Has Paino Yes Site Locations With Dressing Change: Yes Duration of the Pain. Constant / Intermittento Intermittent Rate the pain. Current Pain Level: 4 Worst Pain Level: 10 Least Pain Level: 0 Tolerable Pain Level: 4 Character of Pain Describe the Pain: Aching Pain Management and Medication Current Pain Management: Medication: No Cold Application: No Rest: No Massage: No Activity: No T.E.N.S.: No Heat Application: No Leg drop or elevation: No Is the Current Pain Management Adequate: Inadequate How does your wound impact your activities of  daily livingo Sleep: No Bathing: No Appetite: No Relationship With Others: No Bladder Continence: No Emotions: No Bowel Continence: No Work: No Toileting: No Drive: No Dressing: No Hobbies: No Electronic Signature(s) Signed: 05/18/2021 5:02:53 PM By: [name] RN Entered By: [name] on 05/18/2021 09:35:44 -------------------------------------------------------------------------------- Patient/Caregiver Education Details Patient Name: Date of Service: Ciro Backer 10/11/2022andnbsp9:45 A M Medical Record Number: [id number] Patient Account Number: [id number] Date of Birth/Gender: Treating RN: 1976/07/24 (45 y.o. [name] Primary Care Physician: [name] Other Clinician: Referring  Physician: Treating Physician/Extender: [name] in Treatment: 28 Education Assessment Education Provided To: Patient Education Topics Provided Wound/Skin Impairment: Methods: Explain/Verbal Responses: Reinforcements needed, State content correctly [employer]) Signed: 05/18/2021 5:02:53 PM By: [name] RN Entered By: [name] on 05/18/2021 09:47:58 -------------------------------------------------------------------------------- Wound Assessment Details Patient Name: Date of Service: [name], [place]. 05/18/2021 9:45 A M Medical Record Number: [id number] Patient Account Number: [id number] Date of Birth/Sex: Treating RN: [birth year] (45 y.o. [name], [name] Primary Care Jordany Russett: [name] Other Clinician: Referring Anias Bartol: Treating Marcell Chavarin/Extender: [name] in Treatment: 13 Wound Status Wound Number: 1 Primary Etiology: Pressure Ulcer Wound Location: Right Calcaneus Wound Status: Open Wounding Event: Pressure Injury Comorbid History: Asthma, Angina, Hypertension Date Acquired: 10/07/2019 Weeks Of Treatment: 43 Clustered Wound: No Photos Wound Measurements Length: (cm) 2 Width: (cm) 0.5 Depth: (cm) 0.3 Area: (cm) 0.785 Volume: (cm) 0.236 % Reduction in Area: 97.5% % Reduction in Volume: 99.3% Epithelialization: Small (1-33%) Tunneling: No Undermining: Yes Starting Position (o'clock): 6 Ending Position (o'clock): 12 Maximum Distance: (cm) 0.3 Wound Description Classification: Category/Stage III Wound Margin: Thickened Exudate Amount: Medium Exudate Type: Serosanguineous Exudate Color: red, brown Foul Odor After Cleansing: No Slough/Fibrino No Wound Bed Granulation Amount: Large (67-100%) Exposed Structure Granulation Quality: Red, Pink Fascia Exposed: No Necrotic Amount: Small (1-33%) Fat Layer (Subcutaneous Tissue) Exposed: Yes Necrotic Quality: Adherent  Slough Tendon Exposed: No Muscle Exposed: No Joint Exposed: No Bone Exposed: No Treatment Notes Wound #1 (Calcaneus) Wound Laterality: Right Cleanser Normal Saline Discharge Instruction: Cleanse the wound with Normal Saline prior to applying a clean dressing using gauze sponges, not tissue or cotton balls. Wound Cleanser Discharge Instruction: Cleanse the wound with wound cleanser prior to applying a clean dressing using gauze sponges, not tissue or cotton balls. Peri-Wound Care Topical Mupirocin Ointment Discharge Instruction: Apply Mupirocin (Bactroban) thin layer to wound bed and wound edges Primary Dressing Iodosorb Gel 10 (gm) Tube Discharge Instruction: Apply to wound bed as instructed Secondary Dressing Woven Gauze Sponge, Non-Sterile 4x4 in Discharge Instruction: Apply over primary dressing as directed. Zetuvit Plus 4x8 in Discharge Instruction: Apply over primary dressing as directed. Secured With Elastic Bandage 4 inch (ACE bandage) Discharge Instruction: Secure with ACE bandage as directed. Kerlix Roll Sterile, 4.5x3.1 (in/yd) Discharge Instruction: Secure with Kerlix as directed. Compression Wrap Compression Stockings Add-Ons Electronic Signature(s) Signed: 05/18/2021 5:02:53 PM By: [name] RN Signed: 05/19/2021 1:46:44 PM By: Sandre Kitty Entered By: Sandre Kitty on 05/18/2021 09:43:16 -------------------------------------------------------------------------------- Wound Assessment Details Patient Name: Date of Service: [name], [place] 05/18/2021 9:45 A M Medical Record Number: [id number] Patient Account Number: [id number] Date of Birth/Sex: Treating RN: 1976-01-09 (45 y.o. [name] Primary Care Tiarah Shisler: [name] Other Clinician: Referring Lacorey Brusca: Treating Shakia Sebastiano/Extender: [name] in Treatment: 66 Wound Status Wound Number: 2 Primary Etiology: Pressure Ulcer Wound Location: Left  Calcaneus Wound Status: Open Wounding Event: Pressure Injury Comorbid History: Asthma, Angina,  Hypertension Date Acquired: 10/07/2019 Weeks Of Treatment: 43 Clustered Wound: No Photos Wound Measurements Length: (cm) 3.7 Width: (cm) 1 Depth: (cm) 0.3 Area: (cm) 2.906 Volume: (cm) 0.872 % Reduction in Area: 89.3% % Reduction in Volume: 96.8% Epithelialization: Small (1-33%) Tunneling: No Undermining: No Wound Description Classification: Category/Stage III Wound Margin: Thickened Exudate Amount: Medium Exudate Type: Serosanguineous Exudate Color: red, brown Foul Odor After Cleansing: No Slough/Fibrino No Wound Bed Granulation Amount: Large (67-100%) Exposed Structure Granulation Quality: Red, Pink Fascia Exposed: No Necrotic Amount: Small (1-33%) Fat Layer (Subcutaneous Tissue) Exposed: Yes Necrotic Quality: Adherent Slough Tendon Exposed: No Muscle Exposed: No Joint Exposed: No Bone Exposed: No Treatment Notes Wound #2 (Calcaneus) Wound Laterality: Left Cleanser Normal Saline Discharge Instruction: Cleanse the wound with Normal Saline prior to applying a clean dressing using gauze sponges, not tissue or cotton balls. Wound Cleanser Discharge Instruction: Cleanse the wound with wound cleanser prior to applying a clean dressing using gauze sponges, not tissue or cotton balls. Peri-Wound Care Topical Mupirocin Ointment Discharge Instruction: Apply Mupirocin (Bactroban) thin layer to wound bed and wound edges Primary Dressing Iodosorb Gel 10 (gm) Tube Discharge Instruction: Apply to wound bed as instructed Secondary Dressing Woven Gauze Sponge, Non-Sterile 4x4 in Discharge Instruction: Apply over primary dressing as directed. Zetuvit Plus 4x8 in Discharge Instruction: Apply over primary dressing as directed. Secured With Elastic Bandage 4 inch (ACE bandage) Discharge Instruction: Secure with ACE bandage as directed. Kerlix Roll Sterile, 4.5x3.1 (in/yd) Discharge  Instruction: Secure with Kerlix as directed. Compression Wrap Compression Stockings Add-Ons Electronic Signature(s) Signed: 05/18/2021 5:02:53 PM By: [name] RN Signed: 05/19/2021 1:46:44 PM By: Sandre Kitty Entered By: Sandre Kitty on 05/18/2021 09:43:44 -------------------------------------------------------------------------------- Vitals Details Patient Name: Date of Service: [name], [place] 05/18/2021 9:45 A M Medical Record Number: [id number] Patient Account Number: [id number] Date of Birth/Sex: Treating RN: 1975-10-06 (45 y.o. [name], [name] Primary Care Adonus Uselman: [name] Other Clinician: Referring Trevelle Mcgurn: Treating Margrete Delude/Extender: [name] in Treatment: 40 Vital Signs Time Taken: 09:35 Temperature (F): 98.5 Height (in): 69 Pulse (bpm): 84 Weight (lbs): 280 Respiratory Rate (breaths/min): 17 Body Mass Index (BMI): 41.3 Blood Pressure (mmHg): 159/98 Reference Range: 80 - 120 mg / dl Electronic Signature(s) Signed: 05/18/2021 5:02:53 PM By: [name] RN Entered By: [name] on 05/18/2021 09:35:21

## 2021-05-27 DIAGNOSIS — Z20828 Contact with and (suspected) exposure to other viral communicable diseases: Secondary | ICD-10-CM | POA: Diagnosis not present

## 2021-05-29 DIAGNOSIS — R509 Fever, unspecified: Secondary | ICD-10-CM | POA: Diagnosis not present

## 2021-05-29 DIAGNOSIS — R051 Acute cough: Secondary | ICD-10-CM | POA: Diagnosis not present

## 2021-05-29 DIAGNOSIS — J0100 Acute maxillary sinusitis, unspecified: Secondary | ICD-10-CM | POA: Diagnosis not present

## 2021-06-03 ENCOUNTER — Encounter (HOSPITAL_BASED_OUTPATIENT_CLINIC_OR_DEPARTMENT_OTHER): Payer: BC Managed Care – PPO | Admitting: Internal Medicine

## 2021-06-03 ENCOUNTER — Other Ambulatory Visit: Payer: Self-pay

## 2021-06-03 DIAGNOSIS — L97522 Non-pressure chronic ulcer of other 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: Secondary | ICD-10-CM | POA: Diagnosis not present

## 2021-06-03 DIAGNOSIS — I10 Essential (primary) hypertension: Secondary | ICD-10-CM | POA: Diagnosis not present

## 2021-06-03 DIAGNOSIS — L89623 Pressure ulcer of left heel, stage 3: Secondary | ICD-10-CM | POA: Diagnosis not present

## 2021-06-03 DIAGNOSIS — L89893 Pressure ulcer of other site, stage 3: Secondary | ICD-10-CM | POA: Diagnosis not present

## 2021-06-03 DIAGNOSIS — L97512 Non-pressure chronic ulcer of other part of right foot with fat layer exposed: Secondary | ICD-10-CM | POA: Diagnosis not present

## 2021-06-03 DIAGNOSIS — L89613 Pressure ulcer of right heel, stage 3: Secondary | ICD-10-CM | POA: Diagnosis not present

## 2021-06-04 ENCOUNTER — Encounter
Payer: BC Managed Care – PPO | Attending: Physical Medicine and Rehabilitation | Admitting: Physical Medicine and Rehabilitation

## 2021-06-04 ENCOUNTER — Other Ambulatory Visit: Payer: Self-pay

## 2021-06-04 ENCOUNTER — Encounter: Payer: Self-pay | Admitting: Physical Medicine and Rehabilitation

## 2021-06-04 VITALS — BP 152/92 | HR 76 | Ht 68.5 in | Wt 306.0 lb

## 2021-06-04 DIAGNOSIS — G6281 Critical illness polyneuropathy: Secondary | ICD-10-CM | POA: Diagnosis not present

## 2021-06-04 DIAGNOSIS — Z6841 Body Mass Index (BMI) 40.0 and over, adult: Secondary | ICD-10-CM | POA: Insufficient documentation

## 2021-06-04 DIAGNOSIS — R234 Changes in skin texture: Secondary | ICD-10-CM | POA: Insufficient documentation

## 2021-06-04 DIAGNOSIS — Z993 Dependence on wheelchair: Secondary | ICD-10-CM

## 2021-06-04 HISTORY — DX: Dependence on wheelchair: Z99.3

## 2021-06-04 MED ORDER — TRAZODONE HCL 100 MG PO TABS: 200.0000 mg | ORAL_TABLET | Freq: Every day | ORAL | 3 refills | Status: DC

## 2021-06-04 MED ORDER — DULOXETINE HCL 60 MG PO CPEP: 60.0000 mg | ORAL_CAPSULE | Freq: Every day | ORAL | 3 refills | Status: DC

## 2021-06-04 NOTE — Patient Instructions (Addendum)
  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 Also has L foot drop. Still w/c dependent- still cannot put weight on feet per Plastics.  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   2. Try coming off Abilify- if things take a nose dive, let me know and restart it. Just let me know.    3. Con't Duloxetine- refilled 3 months supply- 3 refills for mood/nerve pain- and Trazodone 200 mg QHS /nightly- for sleep   4. Really need to  send me pics of feet! If send to My chart is HIPAA compliant.    5. Discussed therapy vs marriage counseling. Suggest seeing counselors to work on things.  Not fond of Dr Lyman Speller-  esp due to drive and info gathering procedures.   [facility]- for therapy.    6. Con't Ozempic for weight loss- prediabetic/ per PCP.  7.   F/U in 3 months

## 2021-06-04 NOTE — Progress Notes (Signed)
Subjective:    Patient ID: [name], male    DOB: [birth year], 45 y.o.   MRN: [id number]  HPI  Pt is a 45 yr old male with COVID ICU myopathy, Long COVID,  s/p surgery on feet due to necrosis from long ICU stay/pressors to save life-s/p  skin grafts and R foot osteomyelitis as well. Also has L foot drop. Still w/c dependent- still cannot put weight on feet per Plastics.  Here in f/u for critical illness polyneuropathy  Wounds should be closed back up within 1-2 months.   Mood is all right- sometimes better and sometimes worse.  Not really any different on Abilify.   Went to see Psychiatry- but only once-  Didn't change any of his meds.   Doesn't have appointment to go back- not impressed with doctor-   Saw Dr Lyman Speller in August- in [place]  Didn't get a f/u with him.   Having difficulty with stamina- but needs to be able put weight on feet.   Is moving a little more overall-   Wife has been home a little more lately- last night "had a fiasco".           Pain Inventory Average Pain 4 Pain Right Now 3 My pain is sharp, stabbing, and aching  In the last 24 hours, has pain interfered with the following? General activity 4 Relation with others 6 Enjoyment of life 6 What TIME of day is your pain at its worst? evening Sleep (in general) Fair  Pain is worse with: walking, bending, and standing Pain improves with: rest and medication Relief from Meds: 8  Family History  Problem Relation Age of Onset   Asthma Brother    Diabetes Father    Hypertension Father    Diabetes Paternal Grandmother    Hypertension Paternal Grandmother    Migraines Mother    GER disease Mother    Pancreatic cancer Maternal Grandmother    Heart disease Maternal Grandfather    Social History   Socioeconomic History   Marital status: Married    Spouse name: Not on file   Number of children: 1   Years of education: Not on file   Highest education level: Not on file  Occupational  History   Occupation: delivery driver  Tobacco Use   Smoking status: Never   Smokeless tobacco: Never  Vaping Use   Vaping Use: Never used  Substance and Sexual Activity   Alcohol use: Yes    Comment: rarely 2-3 times a year   Drug use: No   Sexual activity: Yes    Partners: Female  Other Topics Concern   Not on file  Social History Narrative   Not on file   Social Determinants of Health   Financial Resource Strain: Not on file  Food Insecurity: Not on file  Transportation Needs: Not on file  Physical Activity: Not on file  Stress: Not on file  Social Connections: Not on file   Past Surgical History:  Procedure Laterality Date   CANNULATION FOR ECMO (EXTRACORPOREAL MEMBRANE OXYGENATION) N/A 08/28/2019   Procedure: CANNULATION FOR VV ECMO (EXTRACORPOREAL MEMBRANE OXYGENATION);  Surgeon: Prescott Gum, Collier Salina, MD;  Location: [place];  Service: Open Heart Surgery;  Laterality: N/A;  CRESCENT CANNULA   CANNULATION FOR ECMO (EXTRACORPOREAL MEMBRANE OXYGENATION) N/A 09/10/2019   Procedure: CANNULATION FOR ECMO (EXTRACORPOREAL MEMBRANE OXYGENATION) PUTTING IN CRESCENT 32FR CANNULA  AND REMOVING GROING CANNULATION;  Surgeon: Wonda Olds, MD;  Location: [place];  Service:  Open Heart Surgery;  Laterality: N/A;  PUTTING IN CRESCENT/REMOVING GROIN CANNULATION   CYSTOSCOPY/URETEROSCOPY/HOLMIUM LASER/STENT PLACEMENT Left 04/18/2019   Procedure: LEFT URETEROSCOPY/HOLMIUM LASER/STENT PLACEMENT;  Surgeon: Ardis Hughs, MD;  Location: [facility];  Service: Urology;  Laterality: Left;   CYSTOSCOPY/URETEROSCOPY/HOLMIUM LASER/STENT PLACEMENT Left 05/02/2019   Procedure: CYSTOSCOPY/URETEROSCOPY/HOLMIUM LASER/STENT EXCHANGE;  Surgeon: Ardis Hughs, MD;  Location: [facility];  Service: Urology;  Laterality: Left;   ECMO CANNULATION N/A 08/03/2019   Procedure: ECMO CANNULATION;  Surgeon: Wonda Olds, MD;  Location: [place] [facility];  Service: Cardiovascular;  Laterality: N/A;    ESOPHAGOGASTRODUODENOSCOPY N/A 09/11/2019   Procedure: ESOPHAGOGASTRODUODENOSCOPY (EGD);  Surgeon: Wonda Olds, MD;  Location[facility];  Service: Thoracic;  Laterality: N/A;   GRAFT APPLICATION Bilateral 5/99/3570   Procedure: APPLICATION OF SKIN GRAFT BILATERAL FEET;  Surgeon: Edrick Kins, DPM;  Location: [facility];  Service: Podiatry;  Laterality: Bilateral;   IR REPLC GASTRO/COLONIC TUBE PERCUT W/FLUORO  10/14/2019   IRRIGATION AND DEBRIDEMENT SHOULDER Right 09/29/2017   Procedure: IRRIGATION AND DEBRIDEMENT SHOULDER;  Surgeon: Leandrew Koyanagi, MD;  Location: [place];  Service: Orthopedics;  Laterality: Right;   LUMBAR DISC SURGERY  2002   NASAL ENDOSCOPY WITH EPISTAXIS CONTROL N/A 08/31/2019   Procedure: NASAL ENDOSCOPY WITH EPISTAXIS CONTROL WITH CAUTERIZATION;  Surgeon: Melida Quitter, MD;  Location: [place];  Service: ENT;  Laterality: N/A;   PORTACATH PLACEMENT N/A 09/11/2019   Procedure: PEG TUBE INSERTION - BEDSIDE;  Surgeon: Wonda Olds, MD;  Location: [place];  Service: Thoracic;  Laterality: N/A;   TEE WITHOUT CARDIOVERSION N/A 08/28/2019   Procedure: TRANSESOPHAGEAL ECHOCARDIOGRAM (TEE);  Surgeon: Prescott Gum, Collier Salina, MD;  Location: [place];  Service: Open Heart Surgery;  Laterality: N/A;   TEE WITHOUT CARDIOVERSION N/A 09/10/2019   Procedure: TRANSESOPHAGEAL ECHOCARDIOGRAM (TEE);  Surgeon: Wonda Olds, MD;  Location: [place];  Service: Open Heart Surgery;  Laterality: N/A;   WOUND DEBRIDEMENT Bilateral 03/27/2020   Procedure: EXCISIONAL DEBRIDEMENT OF ULCERS BILATERAL FEET;  Surgeon: Edrick Kins, DPM;  Location: [facility];  Service: Podiatry;  Laterality: Bilateral;   Past Surgical History:  Procedure Laterality Date   CANNULATION FOR ECMO (EXTRACORPOREAL MEMBRANE OXYGENATION) N/A 08/28/2019   Procedure: CANNULATION FOR VV ECMO (EXTRACORPOREAL MEMBRANE OXYGENATION);  Surgeon: Prescott Gum, Collier Salina, MD;  Location: [place];  Service: Open Heart Surgery;  Laterality: N/A;  CRESCENT CANNULA    CANNULATION FOR ECMO (EXTRACORPOREAL MEMBRANE OXYGENATION) N/A 09/10/2019   Procedure: CANNULATION FOR ECMO (EXTRACORPOREAL MEMBRANE OXYGENATION) PUTTING IN CRESCENT 32FR CANNULA  AND REMOVING GROING CANNULATION;  Surgeon: Wonda Olds, MD;  Location: [place];  Service: Open Heart Surgery;  Laterality: N/A;  PUTTING IN CRESCENT/REMOVING GROIN CANNULATION   CYSTOSCOPY/URETEROSCOPY/HOLMIUM LASER/STENT PLACEMENT Left 04/18/2019   Procedure: LEFT URETEROSCOPY/HOLMIUM LASER/STENT PLACEMENT;  Surgeon: Ardis Hughs, MD;  Location: [facility];  Service: Urology;  Laterality: Left;   CYSTOSCOPY/URETEROSCOPY/HOLMIUM LASER/STENT PLACEMENT Left 05/02/2019   Procedure: CYSTOSCOPY/URETEROSCOPY/HOLMIUM LASER/STENT EXCHANGE;  Surgeon: Ardis Hughs, MD;  Location[facility];  Service: Urology;  Laterality: Left;   ECMO CANNULATION N/A 08/03/2019   Procedure: ECMO CANNULATION;  Surgeon: Wonda Olds, MD;  Location: [place] [facility];  Service: Cardiovascular;  Laterality: N/A;   ESOPHAGOGASTRODUODENOSCOPY N/A 09/11/2019   Procedure: ESOPHAGOGASTRODUODENOSCOPY (EGD);  Surgeon: Wonda Olds, MD;  Location: [facility];  Service: Thoracic;  Laterality: N/A;   GRAFT APPLICATION Bilateral 1/77/9390   Procedure: APPLICATION OF SKIN GRAFT BILATERAL FEET;  Surgeon: Edrick Kins, DPM;  Location: [facility];  Service: Podiatry;  Laterality: Bilateral;   IR REPLC GASTRO/COLONIC TUBE PERCUT W/FLUORO  10/14/2019   IRRIGATION AND DEBRIDEMENT SHOULDER Right 09/29/2017   Procedure: IRRIGATION AND DEBRIDEMENT SHOULDER;  Surgeon: Leandrew Koyanagi, MD;  Location: [place];  Service: Orthopedics;  Laterality: Right;   LUMBAR DISC SURGERY  2002   NASAL ENDOSCOPY WITH EPISTAXIS CONTROL N/A 08/31/2019   Procedure: NASAL ENDOSCOPY WITH EPISTAXIS CONTROL WITH CAUTERIZATION;  Surgeon: Melida Quitter, MD;  Location: [place];  Service: ENT;  Laterality: N/A;   PORTACATH PLACEMENT N/A 09/11/2019   Procedure: PEG TUBE INSERTION -  BEDSIDE;  Surgeon: Wonda Olds, MD;  Location: [place];  Service: Thoracic;  Laterality: N/A;   TEE WITHOUT CARDIOVERSION N/A 08/28/2019   Procedure: TRANSESOPHAGEAL ECHOCARDIOGRAM (TEE);  Surgeon: Prescott Gum, Collier Salina, MD;  Location: [place];  Service: Open Heart Surgery;  Laterality: N/A;   TEE WITHOUT CARDIOVERSION N/A 09/10/2019   Procedure: TRANSESOPHAGEAL ECHOCARDIOGRAM (TEE);  Surgeon: Wonda Olds, MD;  Location: [place];  Service: Open Heart Surgery;  Laterality: N/A;   WOUND DEBRIDEMENT Bilateral 03/27/2020   Procedure: EXCISIONAL DEBRIDEMENT OF ULCERS BILATERAL FEET;  Surgeon: Edrick Kins, DPM;  Location: [facility];  Service: Podiatry;  Laterality: Bilateral;   Past Medical History:  Diagnosis Date   Anginal pain ([place])    with covid   Anxiety    Asthma    Dyspnea    GERD (gastroesophageal reflux disease)    Headache    History of kidney stones    LEFT URETERAL STONE   HTN (hypertension)    Pancreatitis 2018   GALLBALDDER SLUDGE CAUSED ISSUED RESOLVED   Pneumonia 07/2019   covid   BP (!) 152/92   Pulse 76   Ht 5' 8.5" (1.74 m)   Wt (!) 306 lb (138.8 kg)   SpO2 (!) 88%   BMI 45.85 kg/m   Opioid Risk Score:   Fall Risk Score:  `1  Depression screen PHQ 2/9  Depression screen [facility] 2/9 06/04/2021 03/03/2021 01/01/2021 11/04/2020 10/15/2020 09/09/2020 07/01/2020  Decreased Interest 1 1 0 1 0 1 0  Down, Depressed, Hopeless 1 1 0 1 1 1  0  PHQ - 2 Score 2 2 0 2 1 2  0  Altered sleeping - - - - - - -  Tired, decreased energy - - - - - - -  Change in appetite - - - - - - -  Feeling bad or failure about yourself  - - - - - - -  Trouble concentrating - - - - - - -  Moving slowly or fidgety/restless - - - - - - -  Suicidal thoughts - - - - - - -  PHQ-9 Score - - - - - - -  Difficult doing work/chores - - - - - - -  Some recent data might be hidden      Review of Systems  Musculoskeletal:  Positive for back pain.       Bilateral knee pain Bilateral heel pain  All other  systems reviewed and are negative.     Objective:   Physical Exam  Awake, alert, accompanied by wife, in manual w/c;  Tired- not listening frequently.  322- now 306 lbs - in last 6 weeks- lost weight Not as tight in w/c.      Assessment & Plan:     Pt is a 45 yr old male with COVID ICU myopathy, Long  COVID,  s/p surgery on feet due to necrosis from long ICU stay/pressors to save life-s/p  skin grafts and R foot osteomyelitis as well. Also has L foot drop. Still w/c dependent- still cannot put weight on feet per Plastics.  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   2. Try coming off Abilify- if things take a nose dive, let me know and restart it. Just let me know.    3. Con't Duloxetine- refilled 3 months supply- 3 refills and Trazodone 200 mg QHS /nightly- for sleep   4. Really need to  send me pics of feet! If send to My chart is HIPAA compliant.    5. Discussed therapy vs marriage counseling. Suggest seeing counselors to work on things.  Not fond of Dr Lyman Speller-  esp due to drive and info gathering procedures.   [facility]- for therapy.    6. Con't Ozempic for weight loss- prediabetic/ per PCP.  7.   F/U in 3 months

## 2021-06-08 NOTE — Progress Notes (Signed)
LENNELL, SHANKS ([id number]) Visit Report for 06/03/2021 Arrival Information Details Patient Name: Date of Service: [name], [name] 06/03/2021 9:45 A M Medical Record Number: [id number] Patient Account Number: [id number] Date of Birth/Sex: Treating RN: 1976-01-22 (45 y.o. [name], [name] Primary Care Kyriakos Babler: [name] Other Clinician: Referring Recie Cirrincione: Treating Maanvi Lecompte/Extender: [name] in Treatment: 29 Visit Information History Since Last Visit Added or deleted any medications: No Patient Arrived: Wheel Chair Any new allergies or adverse reactions: No Arrival Time: 10:03 Had a fall or experienced change in No Accompanied By: Spouse activities of daily living that may affect Transfer Assistance: None risk of falls: Patient Identification Verified: Yes Signs or symptoms of abuse/neglect since last visito No Patient Requires Transmission-Based Precautions: No Hospitalized since last visit: No Patient Has Alerts: No Implantable device outside of the clinic excluding No cellular tissue based products placed in the center since last visit: Pain Present Now: No Electronic Signature(s) Signed: 06/03/2021 5:01:05 PM By: [name] Entered By: [name] on 06/03/2021 10:11:44 -------------------------------------------------------------------------------- Lower Extremity Assessment Details Patient Name: Date of Service: [name], [name] 06/03/2021 9:45 A M Medical Record Number: [id number] Patient Account Number: [id number] Date of Birth/Sex: Treating RN: [birth year] (45 y.o. [name] Primary Care Jan Olano: [name] Other Clinician: Referring Jarquis Walker: Treating Trek Kimball/Extender: [name] in Treatment: 46 Edema Assessment Assessed: [name]: No] [name]: No] Edema: [Left: No] [Right: No] Calf Left: Right: Point of Measurement: 29 cm From Medial Instep 42 cm 42.5 cm Ankle Left: Right: Point of  Measurement: 9 cm From Medial Instep 24.5 cm 24 cm Vascular Assessment Pulses: Dorsalis Pedis Palpable: [Left:Yes] [Right:Yes] Posterior Tibial Palpable: [Left:Yes] [Right:Yes] Electronic Signature(s) Signed: 06/03/2021 5:01:05 PM By: [name] Signed: 06/03/2021 5:43:20 PM By: [name] RN, BSN Entered By: [name] on 06/03/2021 10:11:05 -------------------------------------------------------------------------------- Multi Wound Chart Details Patient Name: Date of Service: [name], [name] 06/03/2021 9:45 A M Medical Record Number: [id number] Patient Account Number: [id number] Date of Birth/Sex: Treating RN: 1975-08-09 (45 y.o. [name], [name] Primary Care Joylynn Defrancesco: [name] Other Clinician: Referring [name]: Treating Saleha Kalp/Extender: [name] in Treatment: 45 Vital Signs Height(in): 69 Pulse(bpm): 71 Weight(lbs): 280 Blood Pressure(mmHg): 128/84 Body Mass Index(BMI): 41 Temperature(F): 98.7 Respiratory Rate(breaths/min): 18 Photos: [N/A:N/A] Right Calcaneus Left Calcaneus N/A Wound Location: Pressure Injury Pressure Injury N/A Wounding Event: Pressure Ulcer Pressure Ulcer N/A Primary Etiology: Asthma, Angina, Hypertension Asthma, Angina, Hypertension N/A Comorbid History: 10/07/2019 10/07/2019 N/A Date Acquired: 64 106 N/A Weeks of Treatment: Open Open N/A Wound Status: 1x0.5x0.4 2x0.5x0.3 N/A Measurements L x W x D (cm) 0.393 0.785 N/A A (cm) : rea 0.157 0.236 N/A Volume (cm) : 98.80% 97.10% N/A % Reduction in A rea: 99.50% 99.10% N/A % Reduction in Volume: Category/Stage III Category/Stage III N/A Classification: Medium Medium N/A Exudate A mount: Serosanguineous Serosanguineous N/A Exudate Type: red, brown red, brown N/A Exudate Color: Thickened Thickened N/A Wound Margin: Large (67-100%) Large (67-100%) N/A Granulation A mount: Red, Pink Red, Pink N/A Granulation Quality: Small (1-33%)  Small (1-33%) N/A Necrotic A mount: Fat Layer (Subcutaneous Tissue): Yes Fat Layer (Subcutaneous Tissue): Yes N/A Exposed Structures: Fascia: No Fascia: No Tendon: No Tendon: No Muscle: No Muscle: No Joint: No Joint: No Bone: No Bone: No Small (1-33%) Small (1-33%) N/A Epithelialization: Debridement - Selective/Open Wound Debridement - Selective/Open Wound N/A Debridement: Pre-procedure Verification/Time Out 10:45 10:45 N/A Taken: Other Other N/A Pain Control: Callus Callus N/A Tissue  Debrided: Skin/Epidermis Skin/Epidermis N/A Level: 2.5 2 N/A Debridement A (sq cm): rea Curette Curette N/A Instrument: Minimum Minimum N/A Bleeding: Pressure Silver Nitrate N/A Hemostasis Achieved: 0 0 N/A Procedural Pain: 0 0 N/A Post Procedural Pain: Procedure was tolerated well Procedure was tolerated well N/A Debridement Treatment Response: 1x0.5x0.1 2x0.5x0.3 N/A Post Debridement Measurements L x W x D (cm) 0.039 0.236 N/A Post Debridement Volume: (cm) Category/Stage III Category/Stage III N/A Post Debridement Stage: Debridement Debridement N/A Procedures Performed: Treatment Notes Electronic Signature(s) Signed: 06/03/2021 5:43:20 PM By: [name] RN, BSN Signed: 06/08/2021 4:29:54 PM By: Linton Ham MD Entered By: Linton Ham on 06/03/2021 11:30:46 -------------------------------------------------------------------------------- Multi-Disciplinary Care Plan Details Patient Name: Date of Service: [name], TO [name] E. 06/03/2021 9:45 A M Medical Record Number: [id number] Patient Account Number: [id number] Date of Birth/Sex: Treating RN: Dec 15, 1975 (45 y.o. [name] Primary Care Alante Weimann: [name] Other Clinician: Referring Chasitee Zenker: Treating Malin Sambrano/Extender: [name] in Treatment: 46 Multidisciplinary Care Plan reviewed with physician Active Inactive Abuse / Safety / Falls / Self Care Management Nursing  Diagnoses: Potential for falls Goals: Patient/caregiver will verbalize/demonstrate measures taken to prevent injury and/or falls Date Initiated: 07/15/2020 Target Resolution Date: 07/01/2021 Goal Status: Active Interventions: Assess fall risk on admission and as needed Assess impairment of mobility on admission and as needed per policy Notes: Wound/Skin Impairment Nursing Diagnoses: Impaired tissue integrity Knowledge deficit related to ulceration/compromised skin integrity Goals: Patient/caregiver will verbalize understanding of skin care regimen Date Initiated: 07/15/2020 Target Resolution Date: 07/01/2021 Goal Status: Active Ulcer/skin breakdown will have a volume reduction of 30% by week 4 Date Initiated: 07/15/2020 Date Inactivated: 08/20/2020 Target Resolution Date: 09/03/2020 Goal Status: Unmet Unmet Reason: no major changes. Ulcer/skin breakdown will heal within 14 weeks Date Initiated: 12/04/2020 Date Inactivated: 12/10/2020 Target Resolution Date: 12/10/2020 Unmet Reason: wounds still open at 14 Goal Status: Unmet weeks and today 21 weeks. Interventions: Assess patient/caregiver ability to obtain necessary supplies Assess patient/caregiver ability to perform ulcer/skin care regimen upon admission and as needed Assess ulceration(s) every visit Provide education on ulcer and skin care Treatment Activities: Skin care regimen initiated : 07/15/2020 Topical wound management initiated : 07/15/2020 Notes: Electronic Signature(s) Signed: 06/03/2021 5:29:08 PM By: Baruch Gouty RN, BSN Entered By: Baruch Gouty on 06/03/2021 11:17:49 -------------------------------------------------------------------------------- Pain Assessment Details Patient Name: Date of Service: [name], [place] 06/03/2021 9:45 A M Medical Record Number: [id number] Patient Account Number: [id number] Date of Birth/Sex: Treating RN: Oct 08, 1975 (45 y.o. [name] Primary Care Sergey Ishler: [name] Other Clinician: Referring Eleana Tocco: Treating Audy Dauphine/Extender: [name] in Treatment: 46 Active Problems Location of Pain Severity and Description of Pain Patient Has Paino No Site Locations Rate the pain. Current Pain Level: 0 Pain Management and Medication Current Pain Management: Medication: No Cold Application: No Rest: No Massage: No Activity: No T.E.N.S.: No Heat Application: No Leg drop or elevation: No Is the Current Pain Management Adequate: Adequate How does your wound impact your activities of daily livingo Sleep: No Bathing: No Appetite: No Relationship With Others: No Bladder Continence: No Emotions: No Bowel Continence: No Work: No Toileting: No Drive: No Dressing: No Hobbies: No Electronic Signature(s) Signed: 06/03/2021 5:01:05 PM By: [name] Signed: 06/03/2021 5:43:20 PM By: [name] RN, BSN Entered By: [name] on 06/03/2021 10:09:05 -------------------------------------------------------------------------------- Patient/Caregiver Education Details Patient Name: Date of Service: [name], [place] 10/27/2022andnbsp9:45 [place] Record Number: [id number] Patient Account Number: [id number] Date of Birth/Gender: Treating  RN: 1975-12-12 (45 y.o. [name] Primary Care Physician: [name] Other Clinician: Referring Physician: Treating Physician/Extender: [name] in Treatment: 31 Education Assessment Education Provided To: Patient Education Topics Provided Offloading: Methods: Explain/Verbal Responses: Reinforcements needed, State content correctly Wound/Skin Impairment: Methods: Explain/Verbal Responses: Reinforcements needed, State content correctly Electronic Signature(s) Signed: 06/03/2021 5:29:08 PM By: Baruch Gouty RN, BSN Entered By: Baruch Gouty on 06/03/2021  11:18:23 -------------------------------------------------------------------------------- Wound Assessment Details Patient Name: Date of Service: [name], [place] 06/03/2021 9:45 A M Medical Record Number: [id number] Patient Account Number: [id number] Date of Birth/Sex: Treating RN: Jul 11, 1976 (45 y.o. [name], [name] Primary Care Mikeal Winstanley: [name] Other Clinician: Referring Akari Defelice: Treating Aryonna Gunnerson/Extender: [name] in Treatment: 46 Wound Status Wound Number: 1 Primary Etiology: Pressure Ulcer Wound Location: Right Calcaneus Wound Status: Open Wounding Event: Pressure Injury Comorbid History: Asthma, Angina, Hypertension Date Acquired: 10/07/2019 Weeks Of Treatment: 46 Clustered Wound: No Photos Wound Measurements Length: (cm) 1 Width: (cm) 0.5 Depth: (cm) 0.4 Area: (cm) 0.393 Volume: (cm) 0.157 % Reduction in Area: 98.8% % Reduction in Volume: 99.5% Epithelialization: Small (1-33%) Wound Description Classification: Category/Stage III Wound Margin: Thickened Exudate Amount: Medium Exudate Type: Serosanguineous Exudate Color: red, brown Foul Odor After Cleansing: No Slough/Fibrino No Wound Bed Granulation Amount: Large (67-100%) Exposed Structure Granulation Quality: Red, Pink Fascia Exposed: No Necrotic Amount: Small (1-33%) Fat Layer (Subcutaneous Tissue) Exposed: Yes Necrotic Quality: Adherent Slough Tendon Exposed: No Muscle Exposed: No Joint Exposed: No Bone Exposed: No Electronic Signature(s) Signed: 06/03/2021 5:01:05 PM By: [name] Signed: 06/03/2021 5:43:20 PM By: [name] RN, BSN Entered By: [name] on 06/03/2021 10:19:04 -------------------------------------------------------------------------------- Wound Assessment Details Patient Name: Date of Service: [name], [name] 06/03/2021 9:45 A M Medical Record Number: [id number] Patient Account Number: [id number] Date of  Birth/Sex: Treating RN: 1976-03-13 (45 y.o. [name], [name] Primary Care Mickeal Daws: [name] Other Clinician: Referring Naresh Althaus: Treating Katherine Syme/Extender: [name] in Treatment: 46 Wound Status Wound Number: 2 Primary Etiology: Pressure Ulcer Wound Location: Left Calcaneus Wound Status: Open Wounding Event: Pressure Injury Comorbid History: Asthma, Angina, Hypertension Date Acquired: 10/07/2019 Weeks Of Treatment: 46 Clustered Wound: No Photos Wound Measurements Length: (cm) 2 Width: (cm) 0.5 Depth: (cm) 0.3 Area: (cm) 0.785 Volume: (cm) 0.236 % Reduction in Area: 97.1% % Reduction in Volume: 99.1% Epithelialization: Small (1-33%) Wound Description Classification: Category/Stage III Wound Margin: Thickened Exudate Amount: Medium Exudate Type: Serosanguineous Exudate Color: red, brown Foul Odor After Cleansing: No Slough/Fibrino No Wound Bed Granulation Amount: Large (67-100%) Exposed Structure Granulation Quality: Red, Pink Fascia Exposed: No Necrotic Amount: Small (1-33%) Fat Layer (Subcutaneous Tissue) Exposed: Yes Necrotic Quality: Adherent Slough Tendon Exposed: No Muscle Exposed: No Joint Exposed: No Bone Exposed: No Electronic Signature(s) Signed: 06/03/2021 5:01:05 PM By: [name] Signed: 06/03/2021 5:43:20 PM By: [name] RN, BSN Entered By: [name] on 06/03/2021 10:20:28 -------------------------------------------------------------------------------- Vitals Details Patient Name: Date of Service: [name], [name] 06/03/2021 9:45 A M Medical Record Number: [id number] Patient Account Number: [id number] Date of Birth/Sex: Treating RN: Jan 19, 1976 (45 y.o. [name] Primary Care Sanaya Gwilliam: [name] Other Clinician: Referring Meghan Warshawsky: Treating Aemon Koeller/Extender: [name] in Treatment: 46 Vital Signs Time Taken: 10:03 Temperature (F): 98.7 Height (in):  69 Pulse (bpm): 71 Weight (lbs): 280 Respiratory Rate (breaths/min): 18 Body Mass Index (BMI): 41.3 Blood Pressure (mmHg): 128/84 Reference Range: 80 - 120 mg / dl Electronic Signature(s) Signed: 06/03/2021  5:01:05 PM By: [name] Entered By: [name] on 06/03/2021 10:07:07

## 2021-06-08 NOTE — Progress Notes (Signed)
PATRYCK, KILGORE ([id number]) Visit Report for 06/03/2021 Debridement Details Patient Name: Date of Service: [name], [place] 06/03/2021 9:45 A M Medical Record Number: [id number] Patient Account Number: [id number] Date of Birth/Sex: Treating RN: Dec 16, 1975 (45 y.o. [name], [name] Primary Care Provider: [name] Other Clinician: Referring Provider: Treating Provider/Extender: [name] in Treatment: 46 Debridement Performed for Assessment: Wound #1 Right Calcaneus Performed By: Physician Ricard Dillon., MD Debridement Type: Debridement Level of Consciousness (Pre-procedure): Awake and Alert Pre-procedure Verification/Time Out Yes - 10:45 Taken: Start Time: 10:48 Pain Control: Other : benzocaine 20% spray T Area Debrided (L x W): otal 2.5 (cm) x 1 (cm) = 2.5 (cm) Tissue and other material debrided: Non-Viable, Callus, Skin: Epidermis Level: Skin/Epidermis Debridement Description: Selective/Open Wound Instrument: Curette Bleeding: Minimum Hemostasis Achieved: Pressure End Time: 10:55 Procedural Pain: 0 Post Procedural Pain: 0 Response to Treatment: Procedure was tolerated well Level of Consciousness (Post- Awake and Alert procedure): Post Debridement Measurements of Total Wound Length: (cm) 1 Stage: Category/Stage III Width: (cm) 0.5 Depth: (cm) 0.1 Volume: (cm) 0.039 Character of Wound/Ulcer Post Debridement: Improved Post Procedure Diagnosis Same as Pre-procedure Electronic Signature(s) Signed: 06/03/2021 5:43:20 PM By: [name] RN, BSN Signed: 06/08/2021 4:29:54 PM By: Linton Ham MD Entered By: Linton Ham on 06/03/2021 11:30:59 -------------------------------------------------------------------------------- Debridement Details Patient Name: Date of Service: [name], [name] 06/03/2021 9:45 A M Medical Record Number: [id number] Patient Account Number: [id number] Date of Birth/Sex: Treating RN: 02/25/1976 (45 y.o.  [name] Primary Care Provider: [name] Other Clinician: Referring Provider: Treating Provider/Extender: [name] in Treatment: 46 Debridement Performed for Assessment: Wound #2 Left Calcaneus Performed By: Physician Ricard Dillon., MD Debridement Type: Debridement Level of Consciousness (Pre-procedure): Awake and Alert Pre-procedure Verification/Time Out Yes - 10:45 Taken: Start Time: 10:48 Pain Control: Other : benzocaine 20% spray T Area Debrided (L x W): otal 2 (cm) x 1 (cm) = 2 (cm) Tissue and other material debrided: Non-Viable, Callus, Skin: Epidermis Level: Skin/Epidermis Debridement Description: Selective/Open Wound Instrument: Curette Bleeding: Minimum Hemostasis Achieved: Silver Nitrate End Time: 10:55 Procedural Pain: 0 Post Procedural Pain: 0 Response to Treatment: Procedure was tolerated well Level of Consciousness (Post- Awake and Alert procedure): Post Debridement Measurements of Total Wound Length: (cm) 2 Stage: Category/Stage III Width: (cm) 0.5 Depth: (cm) 0.3 Volume: (cm) 0.236 Character of Wound/Ulcer Post Debridement: Improved Post Procedure Diagnosis Same as Pre-procedure Electronic Signature(s) Signed: 06/03/2021 5:43:20 PM By: [name] RN, BSN Signed: 06/08/2021 4:29:54 PM By: Linton Ham MD Entered By: Linton Ham on 06/03/2021 11:31:09 -------------------------------------------------------------------------------- HPI Details Patient Name: Date of Service: [name], [name] 06/03/2021 9:45 A M Medical Record Number: [id number] Patient Account Number: [id number] Date of Birth/Sex: Treating RN: Dec 02, 1975 (45 y.o. [name] Primary Care Provider: [name] Other Clinician: Referring Provider: Treating Provider/Extender: [name] in Treatment: 75 History of Present Illness HPI Description: Wounds are12/03/2020 upon evaluation today patient presents  for initial inspection here in our clinic concerning issues he has been having with the bottoms of his feet bilaterally. He states these actually occurred as wounds when he was hospitalized for 5 months secondary to Covid. He was apparently with tilting bed where he was in an upright position quite frequently and apparently this occurred in some way shape or form during that time. Fortunately there is no sign of active infection at this time. No fevers, chills, nausea, vomiting, or diarrhea. With that being  said he still has substantial wounds on the plantar aspects of his feet Theragen require quite a bit of work to get these to heal. He has been using Santyl currently though that is been problematic both in receiving the medication as well as actually paid for it as it is become quite expensive. Prior to the experience with Covid the patient really did not have any major medical problems other than hypertension he does have some mild generalized weakness following the Covid experience. 07/22/2020 on evaluation today patient appears to be doing okay in regard to his foot ulcers I feel like the wound beds are showing signs of better improvement that I do believe the Iodoflex is helping in this regard. With that being said he does have a lot of drainage currently and this is somewhat blue/green in nature which is consistent with Pseudomonas. I do think a culture today would be appropriate for [place] to evaluate and see if that is indeed the case I would likely start him on antibiotic orally as well he is not allergic to Cipro knows of no issues he has had in the past 12/21; patient was admitted to the clinic earlier this month with bilateral presumed pressure ulcers on the bottom of his feet apparently related to excessive pressure from a tilt table arrangement in the intensive care unit. Patient relates this to being on ECMO but I am not really sure that is exactly related to that. I must say I have never  seen anything like this. He has fairly extensive full-thickness wounds extending from his heel towards his midfoot mostly centered laterally. There is already been some healing distally. He does not appear to have an arterial issue. He has been using gentamicin to the wound surfaces with Iodoflex to help with ongoing debridement 1/6; this is a patient with pressure ulcers on the bottom of his feet related to excessive pressure from a standing position in the intensive care unit. He is complaining of a lot of pain in the right heel. He is not a diabetic. 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 We have been using Iodoflex to help prepare the surfaces of both wounds for an advanced treatment product. He is nonambulatory spending most of his time in a wheelchair I have asked him not to propel the wheelchair with his heels 1/13; in general his wounds look better not much surface area change we have been using Iodoflex as of last week. I did an x-ray of the right heel as the patient was complaining of pain. I had some thoughts about a stress fracture perhaps Achilles tendon problems however what it showed was erosive changes along the inferior aspect of the calcaneus he now has a MRI booked for 1/20. 1/20; in general his wounds continue to be better. Some improvement in the large narrow areas proximally in his foot. He is still complaining of pain in the right heel and tenderness in certain areas of this wound. His MRI is tonight. I am not just looking for osteomyelitis that was brought up on the x-ray I am wondering about stress fractures, tendon ruptures etc. He has no such findings on the left. Also noteworthy is that the patient had critical illness neuropathy and some of the discomfort may be actual improvement in nerve function I am just not sure. These wounds were initially in the setting of severe critical illness related to COVID-19. He was put in a standing position. He  may have also been  on pressors at the point contributing to tissue ischemia. By his description at some point these wounds were grossly necrotic extending proximally up into the Achilles part of his heel. I do not know that I have ever really seen pictures of them like this although they may exist in epic We have ordered Tri layer Oasis. I am trying to stimulate some granulation in these areas. This is of course assuming the MRI is negative for infection 1/27; since the patient was last here he saw Dr. Juleen China of infectious disease. He is planned for vancomycin and ceftriaxone. Prior operative culture grew MSSA. Also ordered baseline lab work. He also ordered arterial studies although the ABIs in our clinic were normal as well as his clinical exam these were normal I do not think he needs to see vascular surgery. His ABIs at the [facility] were 1.22 in the right triphasic waveforms with a normal TBI of 1.15 on the left ABI of 1.22 with triphasic waveforms and a normal TBI of 1.08. Finally he saw Dr. Amalia Hailey who will follow him in for 2 months. At this point I do not think he felt that he needed a procedure on the right calcaneal bone. Dr. Juleen China is elected for broad-spectrum antibiotic The patient is still having pain in the right heel. He walks with a walker 2/3; wounds are generally smaller. He is tolerating his IV antibiotics. I believe this is vancomycin and ceftriaxone. We are still waiting for Oasis burn in terms of his out-of-pocket max which he should be meeting soon given the IV antibiotics, MRIs etc. I have asked him to check in on this. We are using silver collagen in the meantime the wounds look better 2/10; tolerating IV vancomycin and Rocephin. We are waiting to apply for Oasis. Although I am not really sure where he is in his out-of-pocket max. 2/17 started the first application of Oasis trilayer. Still on antibiotics. The wounds have generally look better. The area on the left has a little  more surface slough requiring debridement 2/19; second application of Oasis trilayer. The wound surface granulation is generally look better. The area on the left with undermining laterally I think is come in a bit. 10/08/2020 upon evaluation today patient is here today for [employer] application #3. Fortunately he seems to be doing extremely well with regard to this and we are seeing a lot of new epithelial growth which is great news. Fortunately there is no signs of active infection at this time. 10/16/2020 upon evaluation today patient appears to be doing well with regard to his foot ulcers. Do believe the Oasis has been of benefit for him. I do not see any signs of infection right now which is great news and I think that he has a lot of new epithelial growth which is great to see as well. The patient is very pleased to hear all of this. I do think we can proceed with the Oasis trilayer #4 today. 3/18; not as much improvement in these areas on his heels that I was hoping. I did reapply trilateral Oasis today the tissue looks healthier but not as much fill in as I was hoping. 3/25; better looking today I think this is come in a bit the tissue looks healthier. Triple layer Oasis reapplied #6 4/1; somewhat better looking definitely better looking surface not as much change in surface area as I was hoping. He may be spending more time [place] on days then he needs to although he does  have heel offloading boots. Triple layer Oasis reapplied #7 4/7; unfortunately apparently [facility] will not approve any further Oasis which is unfortunate since the patient did respond nicely both in terms of the condition of the wound bed as well as surface area. There is still some drainage coming from the wound but not a lot there does not appear to be any infection 4/15; we have been using Hydrofera Blue. He continues to have nice rims of epithelialization on the right greater than the left. The left the  epithelialization is coming from the tip of his heel. There is moderate drainage. In this that concerns me about a total contact cast. There is no evidence of infection 4/29; patient has been using Hydrofera Blue with dressing changes. He has no complaints or issues today. 5/5; using Hydrofera Blue. I actually think that he looks marginally better than the last time I saw this 3 weeks ago. There are rims of epithelialization on the left thumb coming from the medial side on the right. Using Hydrofera Blue 5/12; using Hydrofera Blue. These continue to make improvements in surface area. His drainage was not listed as severe I therefore went ahead and put a cast on the left foot. Right foot we will continue to dress his previous 5/16; back for first total contact cast change. He did not tolerate this particularly well cast injury on the anterior tibia among other issues. Difficulty sleeping. I talked him about this in some detail and afterwards is elected to continue. I told him I would like to have a cast on for 3 weeks to see if this is going to help at all. I think he agreed 5/19; I think the wound is better. There is no tunneling towards his midfoot. The undermining medially also looks better. He has a rim of new skin distally. I think we are making progress here. The area on the left also continues to look somewhat better to me using Hydrofera Blue. He has a list of complaints about the cast but none of them seem serious 5/26; patient presents for 1 week follow-up. He has been using a total contact cast and tolerating this well. Hydrofera Blue is the main dressing used. He denies signs of infection. 6/2 Hydrofera Blue total contact cast on the left. These were large ulcers that formed in intensive care unit where the patient was recovering from [place]. May have had something to do with being ventilated in an upright positiono Pressors etc. We have been able to get the areas down considerably and a  viable surface. There is some epithelialization in both sides. Note made of drainage 6/9; changed to [name] last time because of drainage. He arrives with better looking surfaces and dimensions on the left than the right. Paradoxically the right actually probes more towards his midfoot the left is largely close down but both of these look improved. Using a total contact cast on the left 6/16; complex wounds on his bilateral plantar heels which were initially pressure injury from a stay in the ICU with COVID. We have been using silver alginate most recently. His dimensions of come in quite dramatically however not recently. We have been putting the left foot in a total contact cast 6/23; complex wounds on the bilateral plantar heels. I been putting the left in the cast paradoxically the area on the right is the one that is going towards closure at a faster rate. Quite a bit of drainage on the left. The  patient went to see Dr. Amalia Hailey who said he was going to standby for skin grafts. I had actually considered sending him for skin grafts however he would be mandatorily off his feet for a period of weeks to months. I am thinking that the area on the right is going to close on its own the area on the left has been more stubborn even though we have him in a total contact cast 6/30; took him out of a total contact cast last week is the right heel seem to be making better progress than the left where I was placing the cast. We are using silver alginate. Both wounds are smaller right greater than left 7/12; both wounds look as though they are making some progress. We are using silver alginate. Heel offloading boots 7/26; very gradual progress especially on the right. Using silver alginate. He is wearing heel offloading boots 8/18; he continues to close these wounds down very gradually. Using silver alginate. The problem polymen being definitive about this is areas of what appears to be callus around the  margins. This is not a 100% of the area but certainly sizable especially on the right 9/1; bilateral plantar feet wounds secondary to prolonged pressure while being ventilated for COVID-19 in an upright position. Essentially pressure ulcers on the bottom of his feet. He is made substantial progress using silver alginate. 9/14; bilateral plantar feet wounds secondary to prolonged pressure. Making progress using silver alginate. 9/29 bilateral plantar feet wounds secondary to prolonged pressure. I changed him to Iodoflex last week. MolecuLight showing reddened blush fluorescence 10/11; patient presents for follow-up. He has no issues or complaints today. He denies signs of infection. He continues to use Iodoflex and antibiotic ointment to the wound beds. 10/27; 2-week follow-up. No evidence of infection. He has callus and thick dry skin around the wound margins we have been using Iodoflex and Bactroban which was in response to a moderate left MolecuLight reddish blush fluorescence. Electronic Signature(s) Signed: 06/08/2021 4:29:54 PM By: Linton Ham MD Entered By: Linton Ham on 06/03/2021 11:32:42 -------------------------------------------------------------------------------- Physical Exam Details Patient Name: Date of Service: [name], [name] 06/03/2021 9:45 A M Medical Record Number: [id number] Patient Account Number: [id number] Date of Birth/Sex: Treating RN: 04-16-1976 (45 y.o. [name] Primary Care Provider: [name] Other Clinician: Referring Provider: Treating Provider/Extender: [name] in Treatment: 46 Constitutional Sitting or standing Blood Pressure is within target range for patient.. Pulse regular and within target range for patient.Marland Kitchen Respirations regular, non-labored and within target range.. Temperature is normal and within the target range for the patient.Marland Kitchen Appears in no distress. Notes Wound exam; bilateral plantar heel wounds  extending towards the midfoot. We continue to have gradual improvement in the surface area. As usual he comes in with callus and dry skin around the wound margins which I removed with a #5 curette to give a smooth circular circumferential surfaces. There is no evidence of infection Electronic Signature(s) Signed: 06/08/2021 4:29:54 PM By: Linton Ham MD Entered By: Linton Ham on 06/03/2021 11:34:13 -------------------------------------------------------------------------------- Physician Orders Details Patient Name: Date of Service: [name], [place] 06/03/2021 9:45 A M Medical Record Number: [id number] Patient Account Number: [id number] Date of Birth/Sex: Treating RN: [birth year] (45 y.o. [name] Primary Care Provider: [name] Other Clinician: Referring Provider: Treating Provider/Extender: [name] in Treatment: 21 Verbal / Phone Orders: No Diagnosis Coding ICD-10 Coding Code Description [phone] Non-pressure chronic ulcer of other part of  right foot with fat layer exposed L89.893 Pressure ulcer of other site, stage 3 L97.522 Non-pressure chronic ulcer of other 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 Follow-up Appointments ppointment in 2 weeks. - with Dr. Dellia Nims Return A Bathing/ Shower/ Hygiene May shower with protection but do not get wound dressing(s) wet. Edema Control - Lymphedema / SCD / Other Bilateral Lower Extremities Elevate legs to the level of the heart or above for 30 minutes daily and/or when sitting, a frequency of: - throughout the day Avoid standing for long periods of time. Moisturize legs daily. - right leg and foot every night. Off-Loading Wedge shoe to: - Glophed Shoe to both feet. Other: - keep pressure off of the bottom of your feet Additional Orders / Instructions Follow Nutritious Diet Wound Treatment Wound #1 - Calcaneus Wound Laterality: Right Cleanser: Normal Saline (Generic) Every Other Day/30  Days Discharge Instructions: Cleanse the wound with Normal Saline prior to applying a clean dressing using gauze sponges, not tissue or cotton balls. Cleanser: Wound Cleanser Every Other Day/30 Days Discharge Instructions: Cleanse the wound with wound cleanser prior to applying a clean dressing using gauze sponges, not tissue or cotton balls. Prim Dressing: Iodosorb Gel 10 (gm) Tube (Generic) Every Other Day/30 Days ary Discharge Instructions: Apply to wound bed as instructed Secondary Dressing: Woven Gauze Sponge, Non-Sterile 4x4 in Every Other Day/30 Days Discharge Instructions: Apply over primary dressing as directed. Secondary Dressing: Zetuvit Plus 4x8 in (Generic) Every Other Day/30 Days Discharge Instructions: Apply over primary dressing as directed. Secured With: Elastic Bandage 4 inch (ACE bandage) Every Other Day/30 Days Discharge Instructions: Secure with ACE bandage as directed. Secured With: [employer], 4.5x3.1 (in/yd) (Generic) Every Other Day/30 Days Discharge Instructions: Secure with Kerlix as directed. Wound #2 - Calcaneus Wound Laterality: Left Cleanser: Normal Saline (Generic) Every Other Day/30 Days Discharge Instructions: Cleanse the wound with Normal Saline prior to applying a clean dressing using gauze sponges, not tissue or cotton balls. Cleanser: Wound Cleanser Every Other Day/30 Days Discharge Instructions: Cleanse the wound with wound cleanser prior to applying a clean dressing using gauze sponges, not tissue or cotton balls. Prim Dressing: Iodosorb Gel 10 (gm) Tube (Generic) Every Other Day/30 Days ary Discharge Instructions: Apply to wound bed as instructed Secondary Dressing: Woven Gauze Sponge, Non-Sterile 4x4 in Every Other Day/30 Days Discharge Instructions: Apply over primary dressing as directed. Secondary Dressing: Zetuvit Plus 4x8 in (Generic) Every Other Day/30 Days Discharge Instructions: Apply over primary dressing as directed. Secured  With: Elastic Bandage 4 inch (ACE bandage) Every Other Day/30 Days Discharge Instructions: Secure with ACE bandage as directed. Secured With: [employer], 4.5x3.1 (in/yd) (Generic) Every Other Day/30 Days Discharge Instructions: Secure with Kerlix as directed. Electronic Signature(s) Signed: 06/03/2021 5:29:08 PM By: Baruch Gouty RN, BSN Signed: 06/08/2021 4:29:54 PM By: Linton Ham MD Entered By: Baruch Gouty on 06/03/2021 11:12:59 -------------------------------------------------------------------------------- Problem List Details Patient Name: Date of Service: [name], [place] 06/03/2021 9:45 A M Medical Record Number: [id number] Patient Account Number: [id number] Date of Birth/Sex: Treating RN: [birth year] (45 y.o. [name] Primary Care Provider: [name] Other Clinician: Referring Provider: Treating Provider/Extender: [name] in Treatment: 46 Active Problems ICD-10 Encounter Code Description Active Date MDM Diagnosis [phone] Non-pressure chronic ulcer of other part of right foot with fat layer exposed 09/03/2020 No Yes L89.893 Pressure ulcer of other site, stage 3 07/15/2020 No Yes L97.522 Non-pressure chronic ulcer of other part of left foot with fat  layer exposed 09/03/2020 No Yes Inactive Problems ICD-10 Code Description Active Date Inactive Date M62.81 Muscle weakness (generalized) 07/15/2020 07/15/2020 I10 Essential (primary) hypertension 07/15/2020 07/15/2020 M86.171 Other acute osteomyelitis, right ankle and foot 09/03/2020 09/03/2020 Resolved Problems Electronic Signature(s) Signed: 06/08/2021 4:29:54 PM By: Linton Ham MD Entered By: Linton Ham on 06/03/2021 11:30:40 -------------------------------------------------------------------------------- Progress Note Details Patient Name: Date of Service: [name], [place] 06/03/2021 9:45 A M Medical Record Number: [id number] Patient Account Number:  [id number] Date of Birth/Sex: Treating RN: 01-19-1976 (45 y.o. [name] Primary Care Provider: [name] Other Clinician: Referring Provider: Treating Provider/Extender: [name] in Treatment: 46 Subjective History of Present Illness (HPI) Wounds are12/03/2020 upon evaluation today patient presents for initial inspection here in our clinic concerning issues he has been having with the bottoms of his feet bilaterally. He states these actually occurred as wounds when he was hospitalized for 5 months secondary to Covid. He was apparently with tilting bed where he was in an upright position quite frequently and apparently this occurred in some way shape or form during that time. Fortunately there is no sign of active infection at this time. No fevers, chills, nausea, vomiting, or diarrhea. With that being said he still has substantial wounds on the plantar aspects of his feet Theragen require quite a bit of work to get these to heal. He has been using Santyl currently though that is been problematic both in receiving the medication as well as actually paid for it as it is become quite expensive. Prior to the experience with Covid the patient really did not have any major medical problems other than hypertension he does have some mild generalized weakness following the Covid experience. 07/22/2020 on evaluation today patient appears to be doing okay in regard to his foot ulcers I feel like the wound beds are showing signs of better improvement that I do believe the Iodoflex is helping in this regard. With that being said he does have a lot of drainage currently and this is somewhat blue/green in nature which is consistent with Pseudomonas. I do think a culture today would be appropriate for [place] to evaluate and see if that is indeed the case I would likely start him on antibiotic orally as well he is not allergic to Cipro knows of no issues he has had in the past 12/21;  patient was admitted to the clinic earlier this month with bilateral presumed pressure ulcers on the bottom of his feet apparently related to excessive pressure from a tilt table arrangement in the intensive care unit. Patient relates this to being on ECMO but I am not really sure that is exactly related to that. I must say I have never seen anything like this. He has fairly extensive full-thickness wounds extending from his heel towards his midfoot mostly centered laterally. There is already been some healing distally. He does not appear to have an arterial issue. He has been using gentamicin to the wound surfaces with Iodoflex to help with ongoing debridement 1/6; this is a patient with pressure ulcers on the bottom of his feet related to excessive pressure from a standing position in the intensive care unit. He is complaining of a lot of pain in the right heel. He is not a diabetic. 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 We have been using Iodoflex to help prepare the surfaces of both wounds for an advanced treatment product. He is nonambulatory spending most of his time in  a wheelchair I have asked him not to propel the wheelchair with his heels 1/13; in general his wounds look better not much surface area change we have been using Iodoflex as of last week. I did an x-ray of the right heel as the patient was complaining of pain. I had some thoughts about a stress fracture perhaps Achilles tendon problems however what it showed was erosive changes along the inferior aspect of the calcaneus he now has a MRI booked for 1/20. 1/20; in general his wounds continue to be better. Some improvement in the large narrow areas proximally in his foot. He is still complaining of pain in the right heel and tenderness in certain areas of this wound. His MRI is tonight. I am not just looking for osteomyelitis that was brought up on the x-ray I am wondering about stress fractures, tendon  ruptures etc. He has no such findings on the left. Also noteworthy is that the patient had critical illness neuropathy and some of the discomfort may be actual improvement in nerve function I am just not sure. These wounds were initially in the setting of severe critical illness related to COVID-19. He was put in a standing position. He may have also been on pressors at the point contributing to tissue ischemia. By his description at some point these wounds were grossly necrotic extending proximally up into the Achilles part of his heel. I do not know that I have ever really seen pictures of them like this although they may exist in epic We have ordered Tri layer Oasis. I am trying to stimulate some granulation in these areas. This is of course assuming the MRI is negative for infection 1/27; since the patient was last here he saw Dr. Juleen China of infectious disease. He is planned for vancomycin and ceftriaxone. Prior operative culture grew MSSA. Also ordered baseline lab work. He also ordered arterial studies although the ABIs in our clinic were normal as well as his clinical exam these were normal I do not think he needs to see vascular surgery. His ABIs at the [facility] were 1.22 in the right triphasic waveforms with a normal TBI of 1.15 on the left ABI of 1.22 with triphasic waveforms and a normal TBI of 1.08. Finally he saw Dr. Amalia Hailey who will follow him in for 2 months. At this point I do not think he felt that he needed a procedure on the right calcaneal bone. Dr. Juleen China is elected for broad-spectrum antibiotic The patient is still having pain in the right heel. He walks with a walker 2/3; wounds are generally smaller. He is tolerating his IV antibiotics. I believe this is vancomycin and ceftriaxone. We are still waiting for Oasis burn in terms of his out-of-pocket max which he should be meeting soon given the IV antibiotics, MRIs etc. I have asked him to check in on this. We are using silver collagen  in the meantime the wounds look better 2/10; tolerating IV vancomycin and Rocephin. We are waiting to apply for Oasis. Although I am not really sure where he is in his out-of-pocket max. 2/17 started the first application of Oasis trilayer. Still on antibiotics. The wounds have generally look better. The area on the left has a little more surface slough requiring debridement 0/09; second application of Oasis trilayer. The wound surface granulation is generally look better. The area on the left with undermining laterally I think is come in a bit. 10/08/2020 upon evaluation today patient is here today for [employer]  application #3. Fortunately he seems to be doing extremely well with regard to this and we are seeing a lot of new epithelial growth which is great news. Fortunately there is no signs of active infection at this time. 10/16/2020 upon evaluation today patient appears to be doing well with regard to his foot ulcers. Do believe the Oasis has been of benefit for him. I do not see any signs of infection right now which is great news and I think that he has a lot of new epithelial growth which is great to see as well. The patient is very pleased to hear all of this. I do think we can proceed with the Oasis trilayer #4 today. 3/18; not as much improvement in these areas on his heels that I was hoping. I did reapply trilateral Oasis today the tissue looks healthier but not as much fill in as I was hoping. 3/25; better looking today I think this is come in a bit the tissue looks healthier. Triple layer Oasis reapplied #6 4/1; somewhat better looking definitely better looking surface not as much change in surface area as I was hoping. He may be spending more time [place] on days then he needs to although he does have heel offloading boots. Triple layer Oasis reapplied #7 4/7; unfortunately apparently [facility] will not approve any further Oasis which is unfortunate since the patient did  respond nicely both in terms of the condition of the wound bed as well as surface area. There is still some drainage coming from the wound but not a lot there does not appear to be any infection 4/15; we have been using Hydrofera Blue. He continues to have nice rims of epithelialization on the right greater than the left. The left the epithelialization is coming from the tip of his heel. There is moderate drainage. In this that concerns me about a total contact cast. There is no evidence of infection 4/29; patient has been using Hydrofera Blue with dressing changes. He has no complaints or issues today. 5/5; using Hydrofera Blue. I actually think that he looks marginally better than the last time I saw this 3 weeks ago. There are rims of epithelialization on the left thumb coming from the medial side on the right. Using Hydrofera Blue 5/12; using Hydrofera Blue. These continue to make improvements in surface area. His drainage was not listed as severe I therefore went ahead and put a cast on the left foot. Right foot we will continue to dress his previous 5/16; back for first total contact cast change. He did not tolerate this particularly well cast injury on the anterior tibia among other issues. Difficulty sleeping. I talked him about this in some detail and afterwards is elected to continue. I told him I would like to have a cast on for 3 weeks to see if this is going to help at all. I think he agreed 5/19; I think the wound is better. There is no tunneling towards his midfoot. The undermining medially also looks better. He has a rim of new skin distally. I think we are making progress here. The area on the left also continues to look somewhat better to me using Hydrofera Blue. He has a list of complaints about the cast but none of them seem serious 5/26; patient presents for 1 week follow-up. He has been using a total contact cast and tolerating this well. Hydrofera Blue is the main dressing  used. He denies signs of infection. 6/2 Hydrofera  Blue total contact cast on the left. These were large ulcers that formed in intensive care unit where the patient was recovering from [place]. May have had something to do with being ventilated in an upright positiono Pressors etc. We have been able to get the areas down considerably and a viable surface. There is some epithelialization in both sides. Note made of drainage 6/9; changed to [employer] last time because of drainage. He arrives with better looking surfaces and dimensions on the left than the right. Paradoxically the right actually probes more towards his midfoot the left is largely close down but both of these look improved. Using a total contact cast on the left 6/16; complex wounds on his bilateral plantar heels which were initially pressure injury from a stay in the ICU with COVID. We have been using silver alginate most recently. His dimensions of come in quite dramatically however not recently. We have been putting the left foot in a total contact cast 6/23; complex wounds on the bilateral plantar heels. I been putting the left in the cast paradoxically the area on the right is the one that is going towards closure at a faster rate. Quite a bit of drainage on the left. The patient went to see Dr. Amalia Hailey who said he was going to standby for skin grafts. I had actually considered sending him for skin grafts however he would be mandatorily off his feet for a period of weeks to months. I am thinking that the area on the right is going to close on its own the area on the left has been more stubborn even though we have him in a total contact cast 6/30; took him out of a total contact cast last week is the right heel seem to be making better progress than the left where I was placing the cast. We are using silver alginate. Both wounds are smaller right greater than left 7/12; both wounds look as though they are making some progress. We are  using silver alginate. Heel offloading boots 7/26; very gradual progress especially on the right. Using silver alginate. He is wearing heel offloading boots 8/18; he continues to close these wounds down very gradually. Using silver alginate. The problem polymen being definitive about this is areas of what appears to be callus around the margins. This is not a 100% of the area but certainly sizable especially on the right 9/1; bilateral plantar feet wounds secondary to prolonged pressure while being ventilated for COVID-19 in an upright position. Essentially pressure ulcers on the bottom of his feet. He is made substantial progress using silver alginate. 9/14; bilateral plantar feet wounds secondary to prolonged pressure. Making progress using silver alginate. 9/29 bilateral plantar feet wounds secondary to prolonged pressure. I changed him to Iodoflex last week. MolecuLight showing reddened blush fluorescence 10/11; patient presents for follow-up. He has no issues or complaints today. He denies signs of infection. He continues to use Iodoflex and antibiotic ointment to the wound beds. 10/27; 2-week follow-up. No evidence of infection. He has callus and thick dry skin around the wound margins we have been using Iodoflex and Bactroban which was in response to a moderate left MolecuLight reddish blush fluorescence. Objective Constitutional Sitting or standing Blood Pressure is within target range for patient.. Pulse regular and within target range for patient.Marland Kitchen Respirations regular, non-labored and within target range.. Temperature is normal and within the target range for the patient.Marland Kitchen Appears in no distress. Vitals Time Taken: 10:03 AM, Height: 69 in, Weight:  280 lbs, BMI: 41.3, Temperature: 98.7 F, Pulse: 71 bpm, Respiratory Rate: 18 breaths/min, Blood Pressure: 128/84 mmHg. General Notes: Wound exam; bilateral plantar heel wounds extending towards the midfoot. We continue to have gradual  improvement in the surface area. As usual he comes in with callus and dry skin around the wound margins which I removed with a #5 curette to give a smooth circular circumferential surfaces. There is no evidence of infection Integumentary (Hair, Skin) Wound #1 status is Open. Original cause of wound was Pressure Injury. The date acquired was: 10/07/2019. The wound has been in treatment 46 weeks. The wound is located on the Right Calcaneus. The wound measures 1cm length x 0.5cm width x 0.4cm depth; 0.393cm^2 area and 0.157cm^3 volume. There is Fat Layer (Subcutaneous Tissue) exposed. There is a medium amount of serosanguineous drainage noted. The wound margin is thickened. There is large (67-100%) red, pink granulation within the wound bed. There is a small (1-33%) amount of necrotic tissue within the wound bed including Adherent Slough. Wound #2 status is Open. Original cause of wound was Pressure Injury. The date acquired was: 10/07/2019. The wound has been in treatment 46 weeks. The wound is located on the Left Calcaneus. The wound measures 2cm length x 0.5cm width x 0.3cm depth; 0.785cm^2 area and 0.236cm^3 volume. There is Fat Layer (Subcutaneous Tissue) exposed. There is a medium amount of serosanguineous drainage noted. The wound margin is thickened. There is large (67-100%) red, pink granulation within the wound bed. There is a small (1-33%) amount of necrotic tissue within the wound bed including Adherent Slough. Assessment Active Problems ICD-10 Non-pressure chronic ulcer of other part of right foot with fat layer exposed Pressure ulcer of other site, stage 3 Non-pressure chronic ulcer of other 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 Procedures Wound #1 Pre-procedure diagnosis of Wound #1 is a Pressure Ulcer located on the Right Calcaneus . There was a Selective/Open Wound Skin/Epidermis Debridement with a total area of 2.5 sq cm performed by Ricard Dillon., MD. With the following  instrument(s): Curette to remove Non-Viable tissue/material. Material removed includes Callus and Skin: Epidermis and after achieving pain control using Other (benzocaine 20% spray). No specimens were taken. A time out was conducted at 10:45, prior to the start of the procedure. A Minimum amount of bleeding was controlled with Pressure. The procedure was tolerated well with a pain level of 0 throughout and a pain level of 0 following the procedure. Post Debridement Measurements: 1cm length x 0.5cm width x 0.1cm depth; 0.039cm^3 volume. Post debridement Stage noted as Category/Stage III. Character of Wound/Ulcer Post Debridement is improved. Post procedure Diagnosis Wound #1: Same as Pre-Procedure Wound #2 Pre-procedure diagnosis of Wound #2 is a Pressure Ulcer located on the Left Calcaneus . There was a Selective/Open Wound Skin/Epidermis Debridement with a total area of 2 sq cm performed by Ricard Dillon., MD. With the following instrument(s): Curette to remove Non-Viable tissue/material. Material removed includes Callus and Skin: Epidermis and after achieving pain control using Other (benzocaine 20% spray). No specimens were taken. A time out was conducted at 10:45, prior to the start of the procedure. A Minimum amount of bleeding was controlled with Silver Nitrate. The procedure was tolerated well with a pain level of 0 throughout and a pain level of 0 following the procedure. Post Debridement Measurements: 2cm length x 0.5cm width x 0.3cm depth; 0.236cm^3 volume. Post debridement Stage noted as Category/Stage III. Character of Wound/Ulcer Post Debridement is improved. Post procedure  Diagnosis Wound #2: Same as Pre-Procedure Plan Follow-up Appointments: Return Appointment in 2 weeks. - with Dr. Arcola Jansky Shower/ Hygiene: May shower with protection but do not get wound dressing(s) wet. Edema Control - Lymphedema / SCD / Other: Elevate legs to the level of the heart or above for 30  minutes daily and/or when sitting, a frequency of: - throughout the day Avoid standing for long periods of time. Moisturize legs daily. - right leg and foot every night. Off-Loading: Wedge shoe to: - Glophed Shoe to both feet. Other: - keep pressure off of the bottom of your feet Additional Orders / Instructions: Follow Nutritious Diet WOUND #1: - Calcaneus Wound Laterality: Right Cleanser: Normal Saline (Generic) Every Other Day/30 Days Discharge Instructions: Cleanse the wound with Normal Saline prior to applying a clean dressing using gauze sponges, not tissue or cotton balls. Cleanser: Wound Cleanser Every Other Day/30 Days Discharge Instructions: Cleanse the wound with wound cleanser prior to applying a clean dressing using gauze sponges, not tissue or cotton balls. Prim Dressing: Iodosorb Gel 10 (gm) Tube (Generic) Every Other Day/30 Days ary Discharge Instructions: Apply to wound bed as instructed Secondary Dressing: Woven Gauze Sponge, Non-Sterile 4x4 in Every Other Day/30 Days Discharge Instructions: Apply over primary dressing as directed. Secondary Dressing: Zetuvit Plus 4x8 in (Generic) Every Other Day/30 Days Discharge Instructions: Apply over primary dressing as directed. Secured With: Elastic Bandage 4 inch (ACE bandage) Every Other Day/30 Days Discharge Instructions: Secure with ACE bandage as directed. Secured With: [employer], 4.5x3.1 (in/yd) (Generic) Every Other Day/30 Days Discharge Instructions: Secure with Kerlix as directed. WOUND #2: - Calcaneus Wound Laterality: Left Cleanser: Normal Saline (Generic) Every Other Day/30 Days Discharge Instructions: Cleanse the wound with Normal Saline prior to applying a clean dressing using gauze sponges, not tissue or cotton balls. Cleanser: Wound Cleanser Every Other Day/30 Days Discharge Instructions: Cleanse the wound with wound cleanser prior to applying a clean dressing using gauze sponges, not tissue or cotton  balls. Prim Dressing: Iodosorb Gel 10 (gm) Tube (Generic) Every Other Day/30 Days ary Discharge Instructions: Apply to wound bed as instructed Secondary Dressing: Woven Gauze Sponge, Non-Sterile 4x4 in Every Other Day/30 Days Discharge Instructions: Apply over primary dressing as directed. Secondary Dressing: Zetuvit Plus 4x8 in (Generic) Every Other Day/30 Days Discharge Instructions: Apply over primary dressing as directed. Secured With: Elastic Bandage 4 inch (ACE bandage) Every Other Day/30 Days Discharge Instructions: Secure with ACE bandage as directed. Secured With: [employer], 4.5x3.1 (in/yd) (Generic) Every Other Day/30 Days Discharge Instructions: Secure with Kerlix as directed. 1. Continue with Iodoflex. 2. He seems to be making progress each time I see him. Therefore have not changed the primary dressing 3. He is offloading these and heel offloading boots. I emphasized the need to continue to keep the pressure off the bottom of his plantar foot and heels Electronic Signature(s) Signed: 06/08/2021 4:29:54 PM By: Linton Ham MD Entered By: Linton Ham on 06/03/2021 11:35:22 -------------------------------------------------------------------------------- SuperBill Details Patient Name: Date of Service: [name], [place]. 06/03/2021 Medical Record Number: [id number] Patient Account Number: [id number] Date of Birth/Sex: Treating RN: [id number] (45 y.o. [name] Primary Care Provider: [name] Other Clinician: Referring Provider: Treating Provider/Extender: [name] in Treatment: 46 Diagnosis Coding ICD-10 Codes Code Description [phone] Non-pressure chronic ulcer of other part of right foot with fat layer exposed L89.893 Pressure ulcer of other site, stage 3 L97.522 Non-pressure chronic ulcer of other part of left foot with  fat layer exposed Facility Procedures CPT4 Code: [id number] Description: [phone] - DEBRIDE WOUND 1ST  20 SQ CM OR < ICD-10 Diagnosis Description L97.512 Non-pressure chronic ulcer of other part of right foot with fat layer exposed L97.522 Non-pressure chronic ulcer of other 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 Modifier: Quantity: 1 Physician Procedures : CPT4 Code Description Modifier [id number] - WC PHYS DEBR WO ANESTH 20 SQ CM ICD-10 Diagnosis Description L97.512 Non-pressure chronic ulcer of other part of right foot with fat layer exposed L97.522 Non-pressure chronic ulcer of other part of left  foot with fat layer exposed Quantity: 1 Electronic Signature(s) Signed: 06/08/2021 4:29:54 PM By: Linton Ham MD Entered By: Linton Ham on 06/03/2021 11:35:32

## 2021-06-09 DIAGNOSIS — U071 COVID-19: Secondary | ICD-10-CM | POA: Diagnosis not present

## 2021-06-09 DIAGNOSIS — G4734 Idiopathic sleep related nonobstructive alveolar hypoventilation: Secondary | ICD-10-CM | POA: Diagnosis not present

## 2021-06-12 ENCOUNTER — Emergency Department (HOSPITAL_BASED_OUTPATIENT_CLINIC_OR_DEPARTMENT_OTHER): Payer: BC Managed Care – PPO

## 2021-06-12 ENCOUNTER — Encounter (HOSPITAL_BASED_OUTPATIENT_CLINIC_OR_DEPARTMENT_OTHER): Payer: Self-pay | Admitting: *Deleted

## 2021-06-12 ENCOUNTER — Other Ambulatory Visit: Payer: Self-pay

## 2021-06-12 ENCOUNTER — Emergency Department (HOSPITAL_BASED_OUTPATIENT_CLINIC_OR_DEPARTMENT_OTHER)
Admission: EM | Admit: 2021-06-12 | Discharge: 2021-06-13 | Disposition: A | Discharge status: Home / Self Care | Payer: BC Managed Care – PPO | Attending: Emergency Medicine | Admitting: Emergency Medicine

## 2021-06-12 DIAGNOSIS — Z20828 Contact with and (suspected) exposure to other viral communicable diseases: Secondary | ICD-10-CM | POA: Diagnosis not present

## 2021-06-12 DIAGNOSIS — J9621 Acute and chronic respiratory failure with hypoxia: Secondary | ICD-10-CM | POA: Diagnosis not present

## 2021-06-12 DIAGNOSIS — R9389 Abnormal findings on diagnostic imaging of other specified body structures: Secondary | ICD-10-CM | POA: Diagnosis not present

## 2021-06-12 DIAGNOSIS — R059 Cough, unspecified: Secondary | ICD-10-CM | POA: Diagnosis not present

## 2021-06-12 DIAGNOSIS — R051 Acute cough: Secondary | ICD-10-CM | POA: Insufficient documentation

## 2021-06-12 DIAGNOSIS — Z7951 Long term (current) use of inhaled steroids: Secondary | ICD-10-CM | POA: Diagnosis not present

## 2021-06-12 DIAGNOSIS — Z79899 Other long term (current) drug therapy: Secondary | ICD-10-CM | POA: Insufficient documentation

## 2021-06-12 DIAGNOSIS — Z8616 Personal history of COVID-19: Secondary | ICD-10-CM | POA: Insufficient documentation

## 2021-06-12 DIAGNOSIS — R918 Other nonspecific abnormal finding of lung field: Secondary | ICD-10-CM | POA: Diagnosis not present

## 2021-06-12 DIAGNOSIS — I10 Essential (primary) hypertension: Secondary | ICD-10-CM | POA: Diagnosis not present

## 2021-06-12 DIAGNOSIS — J4540 Moderate persistent asthma, uncomplicated: Secondary | ICD-10-CM | POA: Insufficient documentation

## 2021-06-12 DIAGNOSIS — J479 Bronchiectasis, uncomplicated: Secondary | ICD-10-CM | POA: Diagnosis not present

## 2021-06-12 DIAGNOSIS — R599 Enlarged lymph nodes, unspecified: Secondary | ICD-10-CM | POA: Diagnosis not present

## 2021-06-12 DIAGNOSIS — R911 Solitary pulmonary nodule: Secondary | ICD-10-CM | POA: Diagnosis not present

## 2021-06-12 MED ORDER — DM-GUAIFENESIN ER 30-600 MG PO TB12: 1.0000 | ORAL_TABLET | Freq: Two times a day (BID) | ORAL | 0 refills | Status: DC

## 2021-06-12 NOTE — ED Provider Notes (Signed)
[facility] EMERGENCY DEPARTMENT Provider Note   CSN: [id number] Arrival date & time: 06/12/21  1526     History Chief Complaint  Patient presents with   Cough    [name] is a 45 y.o. male.  HPI  45 year old male presents emergency department from urgent care for requested CT of the chest.  Patient reportedly has been following as an outpatient for cough, chest x-ray reportedly showed an abnormality on the right side that they referred him here for CT.  They do not have the chest x-ray, chest x-ray reader recommendations from urgent care are written down.  Patient is requesting chest CT.  At this time he has no chest pain.  He endorses intermittently productive cough but no ongoing shortness of breath.  No swelling of his lower extremities.  No history of DVT/PE.  No recent fever.  Past Medical History:  Diagnosis Date   Anginal pain ([place])    with covid   Anxiety    Asthma    Dyspnea    GERD (gastroesophageal reflux disease)    Headache    History of kidney stones    LEFT URETERAL STONE   HTN (hypertension)    Pancreatitis 2018   GALLBALDDER SLUDGE CAUSED ISSUED RESOLVED   Pneumonia 07/2019   covid    Patient Active Problem List   Diagnosis Date Noted   Wheelchair dependence 06/04/2021   Encounter for therapeutic drug monitoring 09/29/2020   COVID-19 long hauler manifesting chronic dyspnea 09/15/2020   Morbid obesity with body mass index of 40.0-44.9 in adult [facility]) 09/15/2020   Encounter for attention to tracheostomy ([place]) 09/15/2020   Critical illness polyneuropathy ([place]) 09/15/2020   Moderate protein-calorie malnutrition ([place]) 09/15/2020   Pyogenic inflammation of bone ([place]) 09/09/2020   Long COVID 09/09/2020   Acute osteomyelitis ([place]) 08/31/2020   MSSA (methicillin susceptible Staphylococcus aureus) infection 08/31/2020   Reactive depression 07/01/2020   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   Impaired  sensation to light touch 04/15/2020   Diffuse alveolar damage ([place]) 03/09/2020   Eschar of foot    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   History of COVID-19    Pressure injury of skin 08/22/2019   Need for emotional support    Chest tube in place    Personal history of ECMO    Advanced care planning/counseling discussion    Palliative care by specialist    Goals of care, counseling/discussion    Advanced directives, counseling/discussion    Subcutaneous crepitus    Thrombocytopenia (HCC)    Leukopenia    Fever    Gram positive bacterial infection    Septic arthritis of right acromioclavicular joint ([place]) 09/29/2017   Chronic left-sided low back pain with left-sided sciatica 09/19/2017   Epigastric pain    Essential hypertension 04/17/2017   Moderate persistent asthma 07/21/2015   Allergic rhinoconjunctivitis 07/21/2015   GERD (gastroesophageal reflux disease) 07/21/2015   Abnormal gait 11/12/2009   TARSAL TUNNEL SYNDROME, LEFT 10/08/2009   PES PLANUS 10/08/2009    Past Surgical History:  Procedure Laterality Date   CANNULATION FOR ECMO (EXTRACORPOREAL MEMBRANE OXYGENATION) N/A 08/28/2019   Procedure: CANNULATION FOR VV ECMO (EXTRACORPOREAL MEMBRANE OXYGENATION);  Surgeon: Prescott Gum, Collier Salina, MD;  Location: [place];  Service: Open Heart Surgery;  Laterality: N/A;  CRESCENT CANNULA   CANNULATION FOR ECMO (EXTRACORPOREAL MEMBRANE OXYGENATION) N/A 09/10/2019   Procedure: CANNULATION FOR ECMO (EXTRACORPOREAL MEMBRANE OXYGENATION) PUTTING IN CRESCENT 32FR CANNULA  AND REMOVING GROING CANNULATION;  Surgeon: Wonda Olds, MD;  Location: [place];  Service: Open Heart Surgery;  Laterality: N/A;  PUTTING IN CRESCENT/REMOVING GROIN CANNULATION   CYSTOSCOPY/URETEROSCOPY/HOLMIUM LASER/STENT PLACEMENT Left 04/18/2019   Procedure: LEFT URETEROSCOPY/HOLMIUM LASER/STENT PLACEMENT;  Surgeon: Ardis Hughs, MD;  Location[facility];  Service: Urology;  Laterality: Left;    CYSTOSCOPY/URETEROSCOPY/HOLMIUM LASER/STENT PLACEMENT Left 05/02/2019   Procedure: CYSTOSCOPY/URETEROSCOPY/HOLMIUM LASER/STENT EXCHANGE;  Surgeon: Ardis Hughs, MD;  Location: [facility];  Service: Urology;  Laterality: Left;   ECMO CANNULATION N/A 08/03/2019   Procedure: ECMO CANNULATION;  Surgeon: Wonda Olds, MD;  Location: [facility];  Service: Cardiovascular;  Laterality: N/A;   ESOPHAGOGASTRODUODENOSCOPY N/A 09/11/2019   Procedure: ESOPHAGOGASTRODUODENOSCOPY (EGD);  Surgeon: Wonda Olds, MD;  [facility];  Service: Thoracic;  Laterality: N/A;   GRAFT APPLICATION Bilateral 7/98/9211   Procedure: APPLICATION OF SKIN GRAFT BILATERAL FEET;  Surgeon: Edrick Kins, DPM;  Location: [facility];  Service: Podiatry;  Laterality: Bilateral;   IR REPLC GASTRO/COLONIC TUBE PERCUT W/FLUORO  10/14/2019   IRRIGATION AND DEBRIDEMENT SHOULDER Right 09/29/2017   Procedure: IRRIGATION AND DEBRIDEMENT SHOULDER;  Surgeon: Leandrew Koyanagi, MD;  Location: [place];  Service: Orthopedics;  Laterality: Right;   LUMBAR DISC SURGERY  2002   NASAL ENDOSCOPY WITH EPISTAXIS CONTROL N/A 08/31/2019   Procedure: NASAL ENDOSCOPY WITH EPISTAXIS CONTROL WITH CAUTERIZATION;  Surgeon: Melida Quitter, MD;  Location: [place];  Service: ENT;  Laterality: N/A;   PORTACATH PLACEMENT N/A 09/11/2019   Procedure: PEG TUBE INSERTION - BEDSIDE;  Surgeon: Wonda Olds, MD;  Location: [place];  Service: Thoracic;  Laterality: N/A;   TEE WITHOUT CARDIOVERSION N/A 08/28/2019   Procedure: TRANSESOPHAGEAL ECHOCARDIOGRAM (TEE);  Surgeon: Prescott Gum, Collier Salina, MD;  Location: [place];  Service: Open Heart Surgery;  Laterality: N/A;   TEE WITHOUT CARDIOVERSION N/A 09/10/2019   Procedure: TRANSESOPHAGEAL ECHOCARDIOGRAM (TEE);  Surgeon: Wonda Olds, MD;  Location: [place];  Service: Open Heart Surgery;  Laterality: N/A;   WOUND DEBRIDEMENT Bilateral 03/27/2020   Procedure: EXCISIONAL DEBRIDEMENT OF ULCERS BILATERAL FEET;  Surgeon: Edrick Kins, DPM;  Location: [facility];  Service: Podiatry;  Laterality: Bilateral;       Family History  Problem Relation Age of Onset   Asthma Brother    Diabetes Father    Hypertension Father    Diabetes Paternal Grandmother    Hypertension Paternal Grandmother    Migraines Mother    GER disease Mother    Pancreatic cancer Maternal Grandmother    Heart disease Maternal Grandfather     Social History   Tobacco Use   Smoking status: Never   Smokeless tobacco: Never  Vaping Use   Vaping Use: Never used  Substance Use Topics   Alcohol use: Yes    Comment: rarely 2-3 times a year   Drug use: No    Home Medications Prior to Admission medications   Medication Sig Start Date End Date Taking? Authorizing Provider  dextromethorphan-guaiFENesin (MUCINEX DM) 30-600 MG 12hr tablet Take 1 tablet by mouth 2 (two) times daily. 06/12/21  Yes Jenina Moening, Alvin Critchley, DO  albuterol (PROAIR HFA) 108 (90 Base) MCG/ACT inhaler Inhale 2 puffs into the lungs every 4 (four) hours as needed for wheezing or shortness of breath. 12/18/20   Valentina Shaggy, MD  amLODipine (NORVASC) 2.5 MG tablet Take 1 tablet (2.5 mg total) by mouth daily. 07/01/20   Lovorn, Jinny Blossom, MD  ARIPiprazole (ABILIFY) 2 MG tablet TAKE  1 TABLET (2 MG TOTAL) BY MOUTH AT BEDTIME. FOR RESISTANT DEPRESSION 03/26/21   Lovorn, Jinny Blossom, MD  budesonide-formoterol [facility]) 160-4.5 MCG/ACT inhaler Inhale 2 puffs into the lungs 2 (two) times daily. 12/15/20   Valentina Shaggy, MD  DULoxetine (CYMBALTA) 60 MG capsule Take 1 capsule (60 mg total) by mouth daily. 06/04/21   Lovorn, Jinny Blossom, MD  HYDROcodone-acetaminophen (NORCO) 5-325 MG tablet Take 1 tablet by mouth every 6 (six) hours as needed for moderate pain. 05/06/21   Lovorn, Jinny Blossom, MD  ipratropium (ATROVENT) 0.03 % nasal spray Place 2 sprays into both nostrils 3 (three) times daily. 12/15/20   Valentina Shaggy, MD  metoprolol tartrate (LOPRESSOR) 25 MG tablet TAKE 1 TABLET BY MOUTH TWICE A DAY  06/08/20   Lovorn, Jinny Blossom, MD  montelukast (SINGULAIR) 10 MG tablet Take 10 mg by mouth daily. 06/07/20   [provider]  pantoprazole (PROTONIX) 40 MG tablet Take 1 tablet (40 mg total) by mouth daily. 11/15/19   Lyda Jester M, PA-C  Semaglutide (OZEMPIC, 0.25 OR 0.5 MG/DOSE, [place]) Inject into the skin.    [provider]  traZODone (DESYREL) 100 MG tablet Take 2 tablets (200 mg total) by mouth at bedtime. 06/04/21   Lovorn, Jinny Blossom, MD  triamcinolone (NASACORT) 55 MCG/ACT AERO nasal inhaler Place 2 sprays into the nose daily.    [provider]  triamcinolone cream (KENALOG) 0.1 % SMARTSIG:Liberally Topical Twice Daily PRN 02/22/21   [provider]    Allergies    Patient has no known allergies.  Review of Systems   Review of Systems  Constitutional:  Negative for chills and fever.  HENT:  Negative for congestion.   Eyes:  Negative for visual disturbance.  Respiratory:  Positive for cough. Negative for shortness of breath.   Cardiovascular:  Negative for chest pain, palpitations and leg swelling.  Gastrointestinal:  Negative for abdominal pain, diarrhea and vomiting.  Genitourinary:  Negative for dysuria.  Skin:  Negative for rash.  Neurological:  Negative for headaches.   Physical Exam Updated Vital Signs BP (!) 133/96   Pulse 83   Temp 98.1 F (36.7 C) (Oral)   Resp 18   Ht 5' 8.5" (1.74 m)   Wt (!) 137 kg   SpO2 91%   BMI 45.25 kg/m   Physical Exam Vitals and nursing note reviewed.  Constitutional:      General: He is not in acute distress.    Appearance: Normal appearance. He is not diaphoretic.  HENT:     Head: Normocephalic.     Mouth/Throat:     Mouth: Mucous membranes are moist.  Cardiovascular:     Rate and Rhythm: Normal rate.  Pulmonary:     Effort: Pulmonary effort is normal. No respiratory distress.     Breath sounds: No wheezing or rales.  Abdominal:     Palpations: Abdomen is soft.     Tenderness: There is no  abdominal tenderness.  Musculoskeletal:        General: No swelling.  Skin:    General: Skin is warm.  Neurological:     Mental Status: He is alert and oriented to person, place, and time. Mental status is at baseline.  Psychiatric:        Mood and Affect: Mood normal.    ED Results / Procedures / Treatments   Labs (all labs ordered are listed, but only abnormal results are displayed) Labs Reviewed - No data to display  EKG None  Radiology CT Chest  Wo Contrast  Result Date: 06/12/2021 CLINICAL DATA:  Abnormal xray - lung nodule, >= 1 cm reported abnormal outpatient CXR, declines re XR, no s/s of PE EXAM: CT CHEST WITHOUT CONTRAST TECHNIQUE: Multidetector CT imaging of the chest was performed following the standard protocol without IV contrast. COMPARISON:  Chest CT 09/05/2019. Most recent chest radiograph 10/27/2019 FINDINGS: Cardiovascular: Normal caliber thoracic aorta. Heart is normal in size. Small volume pericardial fluid anteriorly, similar to prior. Mediastinum/Nodes: Shotty mediastinal lymph nodes, with largest lymph node measuring 14 mm short axis in the right lower paratracheal station. Overall improved adenopathy from prior exam. Limited assessment for hilar adenopathy on this unenhanced exam. Tracheostomy tube is been decannulated. No esophageal wall thickening. Lungs/Pleura: Bandlike scarring with bronchiectasis and architectural distortion in the right greater than left upper lobes. Additional areas of scarring throughout both lungs, in a subpleural predominant distribution. Some of these areas of scarring are slightly confluent. Tiny 4 mm subpleural nodule in the right middle lobe, series 3, image 64. This was previously obscured by airspace disease. No larger nodule. No evidence of acute consolidation. No pleural fluid. Minimal retained mucus in the trachea. Upper Abdomen: Gallbladder filled with gallstones, nondistended. No acute upper abdominal findings. Musculoskeletal:  Exaggerated thoracic kyphosis. Mild flattening of lower thoracic vertebra, chronic and unchanged There are no acute or suspicious osseous abnormalities. IMPRESSION: 1. Tiny 4 mm subpleural nodule in the right middle lobe, previously obscured by airspace disease. No follow-up needed if patient is low-risk. Non-contrast chest CT can be considered in 12 months if patient is high-risk. This recommendation follows the consensus statement: Guidelines for Management of Incidental Pulmonary Nodules Detected on CT Images: From the Fleischner Society 2017; Radiology 2017; 284:228-243. 2. Bandlike scarring with bronchiectasis and architectural distortion in the right greater than left upper lobes. Additional areas of scarring throughout both lungs, in a subpleural predominant distribution. Findings typical of post COVID sequela. 3. Shotty mediastinal lymph nodes, improved from prior exam, likely reactive. 4. Tracheostomy tube has been decannulated. 5. Incidental cholelithiasis. Electronically Signed   By: Keith Rake M.D.   On: 06/12/2021 20:24    Procedures Procedures   Medications Ordered in ED Medications - No data to display  ED Course  I have reviewed the triage vital signs and the nursing notes.  Pertinent labs & imaging results that were available during my care of the patient were reviewed by me and considered in my medical decision making (see chart for details).    MDM Rules/Calculators/A&P                           45 year old male presents emergency department at the request of urgent care wanting a chest CT for reported abnormal finding on a chest x-ray.  They do not have the imaging, imaging report or recommendations from urgent care.  Vitals are normal and stable.  He has had an intermittently productive cough but denies any chest pain, shortness of breath.  No symptoms of DVT/PE, he is PERC negative.  No symptoms of CHF.  Chest CT identifies a right-sided lung nodule, not suspicious  appearing in nature.  Recommended outpatient follow-up with repeat imaging.  This is most likely the abnormality that they saw on the chest x-ray.  Patient will be treated symptomatically for cough but no other acute findings of infection.  Patient at this time appears safe and stable for discharge and will be treated as an outpatient.  Discharge plan and strict  return to ED precautions discussed, patient verbalizes understanding and agreement.  Final Clinical Impression(s) / ED Diagnoses Final diagnoses:  Acute cough    Rx / DC Orders ED Discharge Orders          Ordered    dextromethorphan-guaiFENesin [facility] DM) 30-600 MG 12hr tablet  2 times daily        06/12/21 2340             Brizeyda Holtmeyer, Alvin Critchley, DO 06/12/21 2347

## 2021-06-12 NOTE — Discharge Instructions (Addendum)
You have been seen and discharged from the emergency department.  Your chest CT shows a right-sided lung nodule.  It is recommended that this is followed up yearly with repeat imaging.  No suspicious findings in regards to that at this time.  Otherwise it identifies ongoing chronic findings.  Take prescription as directed for symptom relief.  Follow-up with your primary provider for reevaluation and further care. Take home medications as prescribed. If you have any worsening symptoms or further concerns for your health please return to an emergency department for further evaluation.

## 2021-06-12 NOTE — ED Triage Notes (Signed)
Pt seen today at [facility] in [place] for f/u chest xray. States he had one on 05/29/21 and was told the one today was worse. Had negative Flu and Covid test this today. Was told he needed a CT scan by Urgent Care and they sent him here for further eval. States he wear 2L [place] at night. Eval in triage by RT.

## 2021-06-13 ENCOUNTER — Telehealth (HOSPITAL_BASED_OUTPATIENT_CLINIC_OR_DEPARTMENT_OTHER): Payer: Self-pay | Admitting: Emergency Medicine

## 2021-06-13 MED ORDER — GUAIFENESIN-DM 100-10 MG/5ML PO SYRP: 5.0000 mL | ORAL_SOLUTION | ORAL | 0 refills | Status: DC | PRN

## 2021-06-13 NOTE — Telephone Encounter (Signed)
Encounter created to resend prescription from ER visit yesterday.

## 2021-06-15 DIAGNOSIS — S91309A Unspecified open wound, unspecified foot, initial encounter: Secondary | ICD-10-CM | POA: Diagnosis not present

## 2021-06-15 IMAGING — US US PELVIS LIMITED
1 series · 14 of 19 positions shown · non-contrast
Comparison: CT of the abdomen and pelvis 04/19/2017

CLINICAL DATA: Inguinal pain, unspecified laterality. Patient
states left groin since yesterday. Question hernia.

EXAM:
LIMITED ULTRASOUND OF PELVIS
TECHNIQUE: Limited transabdominal ultrasound examination of the pelvis was
performed.

[Series 1: us pelvis limited · 0.10mm/px · 19 acquisitions, 14 frames shown]
[im 1/19]
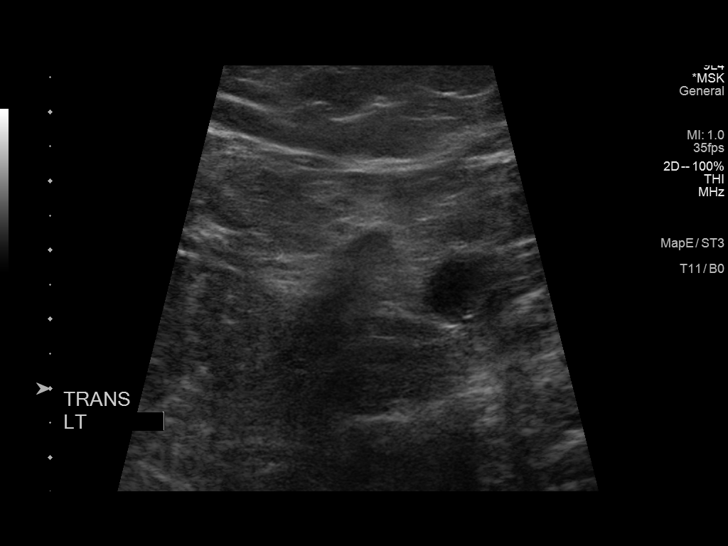
[im 3/19]
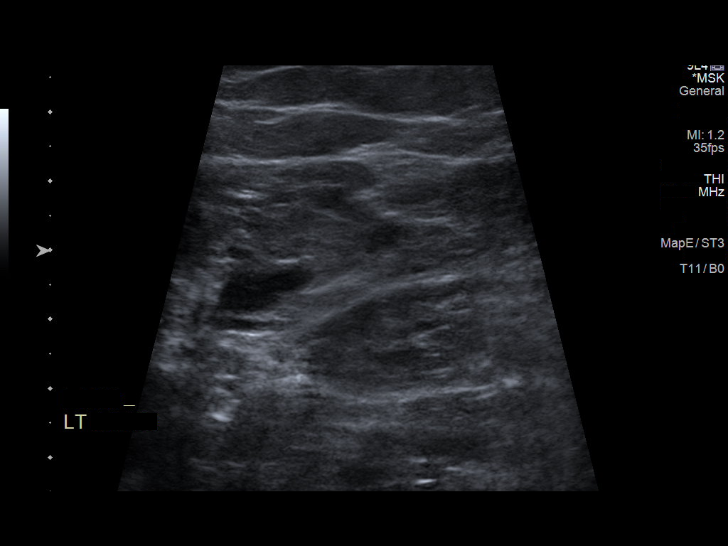
[im 4/19]
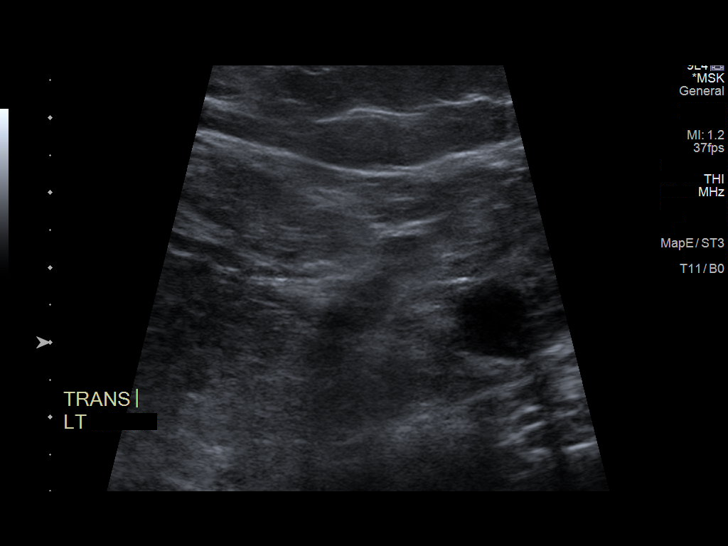
[im 5/19]
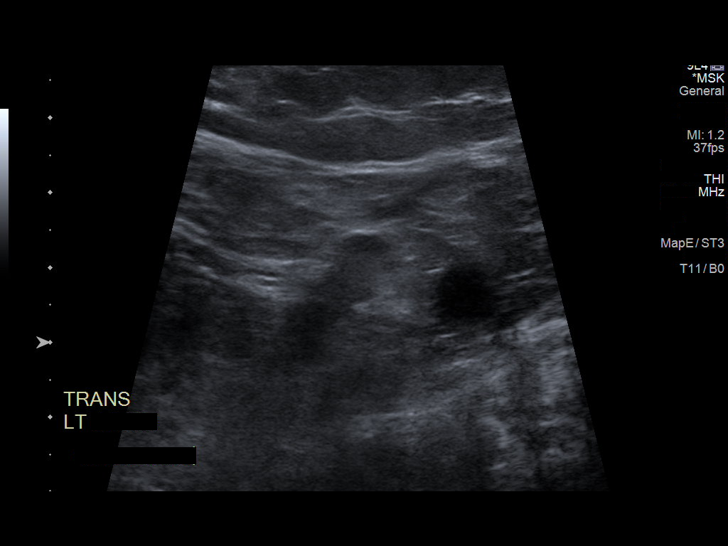
[im 7/19]
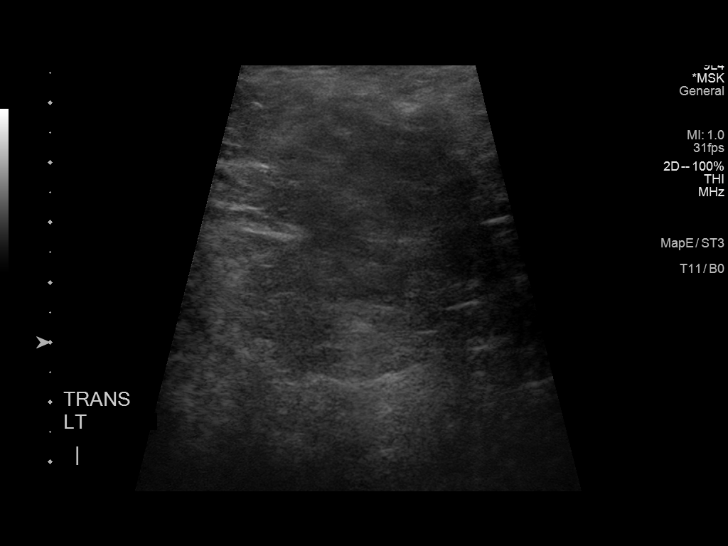
[im 8/19]
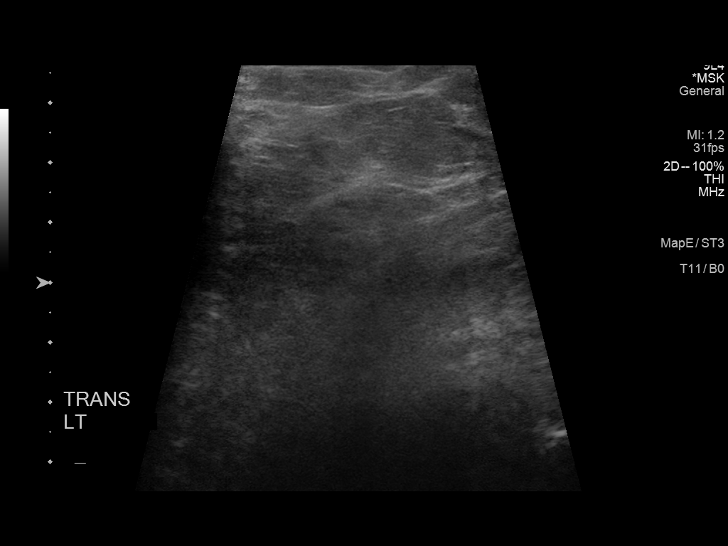
[im 9/19]
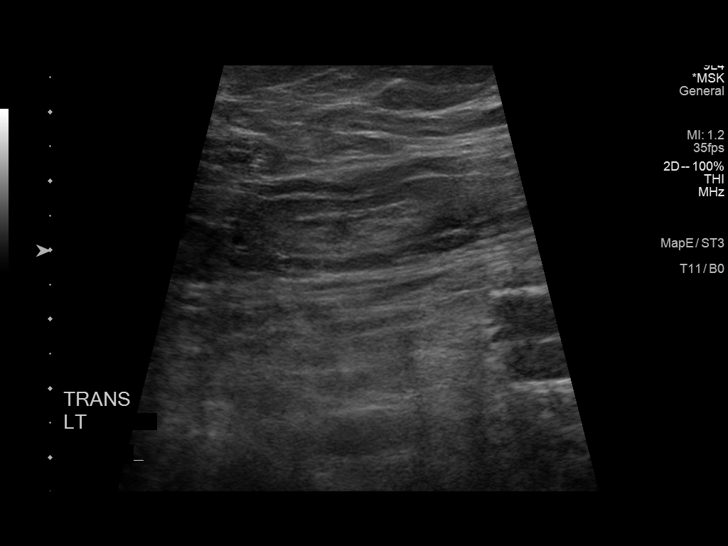
[im 11/19]
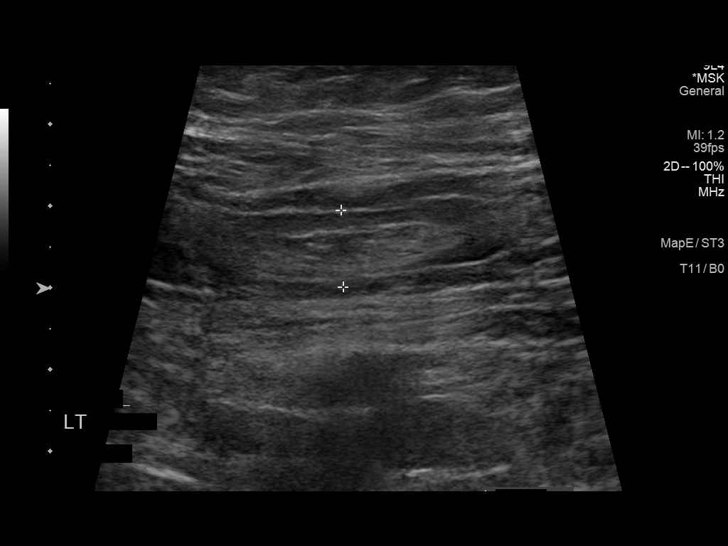
[im 12/19]
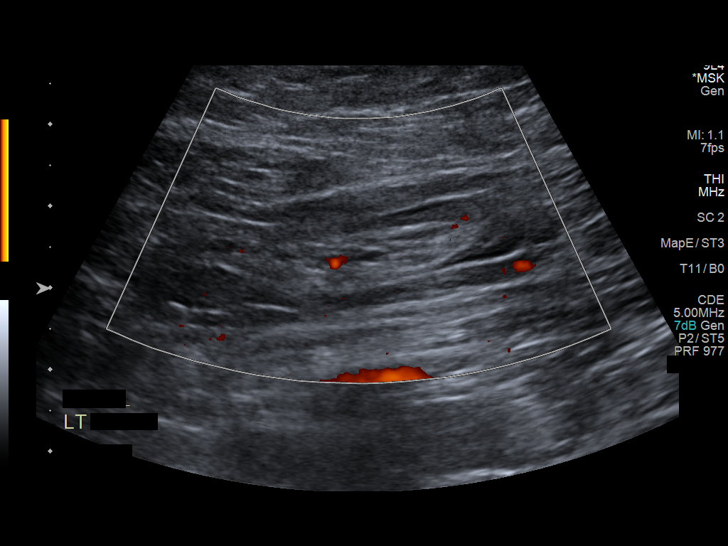
[im 13/19]
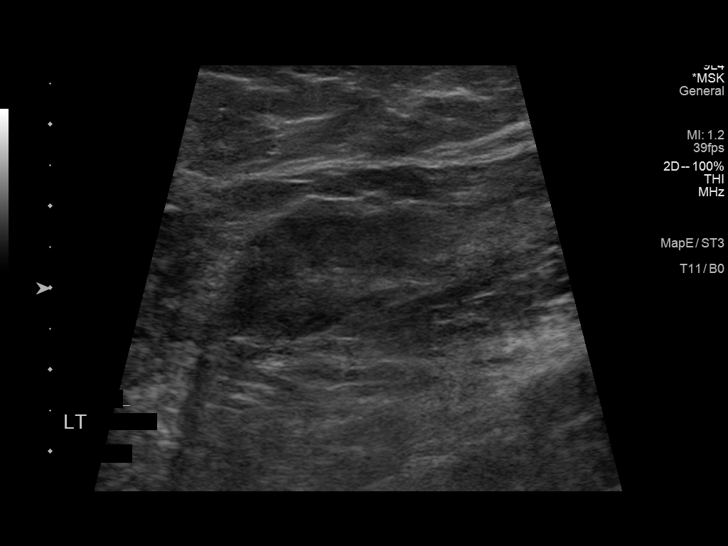
[im 15/19]
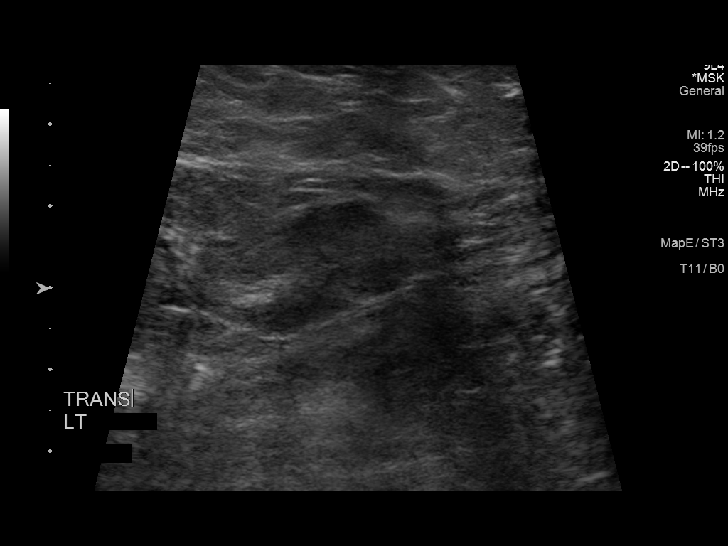
[im 16/19]
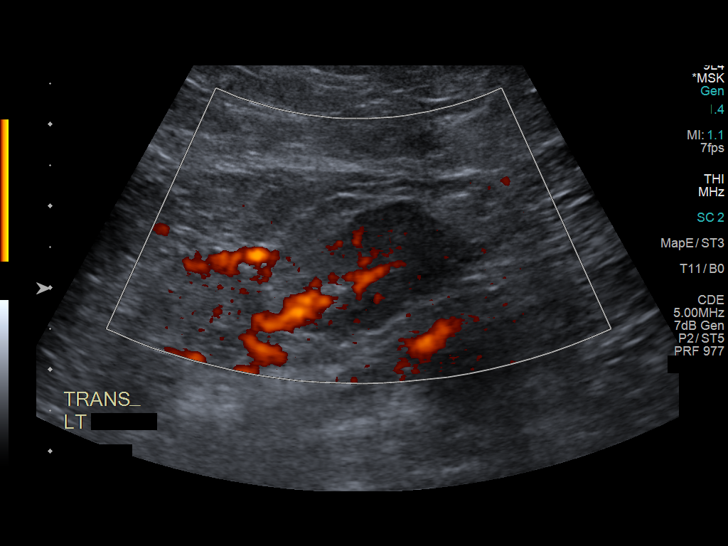
[im 17/19]
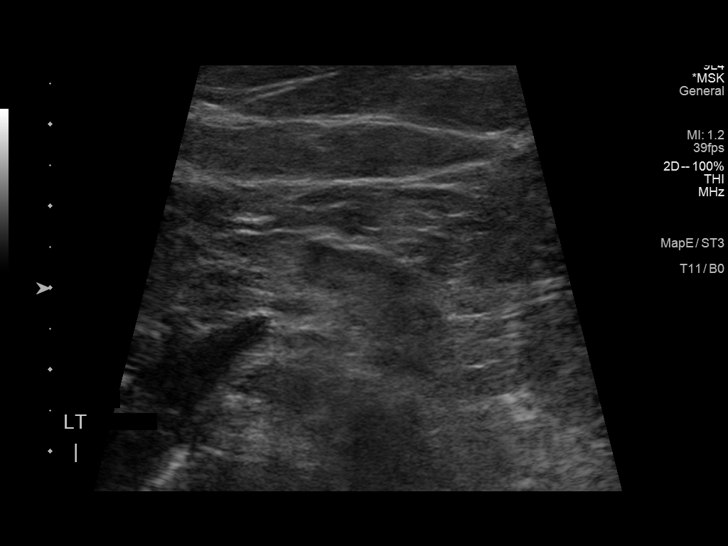
[im 19/19]
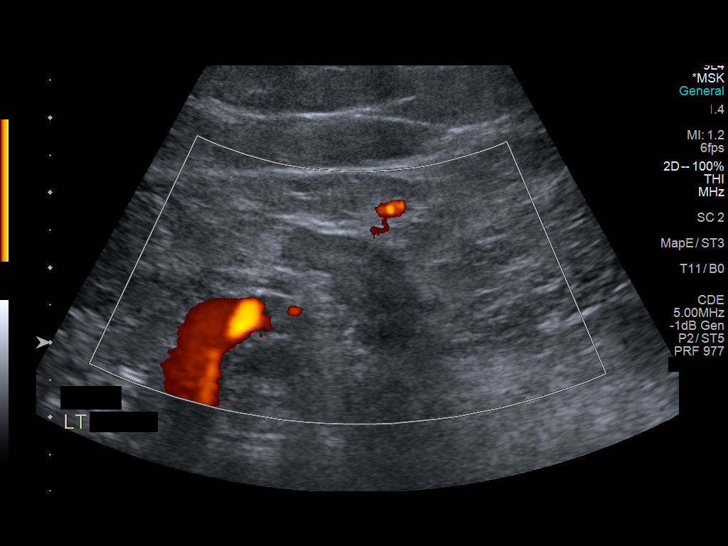

[14 of 19 positions shown; findings below may reference images not displayed]

FINDINGS: Herniates into the left inguinal canal as previously seen. There is
no definite bowel or peristalsis. Adjacent lymph node is noted.
IMPRESSION: Left inguinal hernia contains fat without definite bowel.

## 2021-06-16 DIAGNOSIS — M6281 Muscle weakness (generalized): Secondary | ICD-10-CM | POA: Diagnosis not present

## 2021-06-16 DIAGNOSIS — Z741 Need for assistance with personal care: Secondary | ICD-10-CM | POA: Diagnosis not present

## 2021-06-16 DIAGNOSIS — G7281 Critical illness myopathy: Secondary | ICD-10-CM | POA: Diagnosis not present

## 2021-06-16 DIAGNOSIS — M6259 Muscle wasting and atrophy, not elsewhere classified, multiple sites: Secondary | ICD-10-CM | POA: Diagnosis not present

## 2021-06-17 ENCOUNTER — Encounter (HOSPITAL_BASED_OUTPATIENT_CLINIC_OR_DEPARTMENT_OTHER): Payer: BC Managed Care – PPO | Attending: Internal Medicine | Admitting: Internal Medicine

## 2021-06-17 ENCOUNTER — Other Ambulatory Visit: Payer: Self-pay

## 2021-06-17 DIAGNOSIS — I10 Essential (primary) hypertension: Secondary | ICD-10-CM | POA: Diagnosis not present

## 2021-06-17 DIAGNOSIS — L97522 Non-pressure chronic ulcer of other 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: Secondary | ICD-10-CM | POA: Insufficient documentation

## 2021-06-17 DIAGNOSIS — L97512 Non-pressure chronic ulcer of other part of right foot with fat layer exposed: Secondary | ICD-10-CM | POA: Diagnosis not present

## 2021-06-17 DIAGNOSIS — J45909 Unspecified asthma, uncomplicated: Secondary | ICD-10-CM | POA: Insufficient documentation

## 2021-06-17 DIAGNOSIS — L89623 Pressure ulcer of left heel, stage 3: Secondary | ICD-10-CM | POA: Diagnosis not present

## 2021-06-17 DIAGNOSIS — L89893 Pressure ulcer of other site, stage 3: Secondary | ICD-10-CM | POA: Insufficient documentation

## 2021-06-17 DIAGNOSIS — L89613 Pressure ulcer of right heel, stage 3: Secondary | ICD-10-CM | POA: Diagnosis not present

## 2021-06-18 NOTE — Progress Notes (Signed)
[name], PURYEAR ([id number]) Visit Report for 06/17/2021 Arrival Information Details Patient Name: Date of Service: [name], [name] 06/17/2021 9:45 A M Medical Record Number: [id number] Patient Account Number: [id number] Date of Birth/Sex: Treating RN: [name] (45 y.o. [name], [name]: [name] Other Clinician: Referring Torrell Krutz: Treating Remmie Bembenek/Extender: [name] in Treatment: 20 Visit Information History Since Last Visit Added or deleted any medications: No Patient Arrived: Wheel Chair Any new allergies or adverse reactions: No Arrival Time: 09:22 Had a fall or experienced change in No Accompanied By: self activities of daily living that [name] affect Transfer Assistance: Manual risk of falls: Patient Identification Verified: Yes Signs or symptoms of abuse/neglect since last visito No Secondary Verification Process Completed: Yes Hospitalized since last visit: No Patient Requires Transmission-Based Precautions: No Implantable device outside of the clinic excluding No Patient Has Alerts: No cellular tissue based products placed in the center since last visit: Has Dressing in Place as Prescribed: Yes Pain Present Now: No Electronic Signature(s) Signed: 06/18/2021 12:27:53 PM By: [name] RN Entered By: [name] on 06/17/2021 09:22:38 -------------------------------------------------------------------------------- Encounter Discharge Information Details Patient Name: Date of Service: [name] 06/17/2021 9:45 A M Medical Record Number: [id number] Patient Account Number: [id number] Date of Birth/Sex: Treating RN: [name] (45 y.o. [name] Primary Care Carley Strickling: [name] Other Clinician: Referring Klarissa Mcilvain: Treating [name]/Extender: [name] in Treatment: 48 Encounter Discharge Information Items Post Procedure Vitals Discharge Condition:  Stable Temperature (F): 97.8 Ambulatory Status: Wheelchair Pulse (bpm): 74 Discharge Destination: Home Respiratory Rate (breaths/min): 18 Transportation: Private Auto Blood Pressure (mmHg): 133/68 Accompanied By: self Schedule Follow-up Appointment: Yes Clinical Summary of Care: Patient Declined Electronic Signature(s) Signed: 06/18/2021 12:27:53 PM By: [name] RN Entered By: [name] on 06/17/2021 09:49:12 -------------------------------------------------------------------------------- Lower Extremity Assessment Details Patient Name: Date of Service: [name], [name] 06/17/2021 9:45 A M Medical Record Number: [id number] Patient Account Number: [id number] Date of Birth/Sex: Treating RN: [birth year] (45 y.o. [name] Primary Care Tannia Contino: [name] Other Clinician: Referring [name]: Treating Damonique Brunelle/Extender: [name] in Treatment: 48 Edema Assessment Assessed: [name]: Yes] [name]: Yes] Edema: [Left: No] [Right: No] Calf Left: Right: Point of Measurement: 29 cm From Medial Instep 42.5 cm 44 cm Ankle Left: Right: Point of Measurement: 9 cm From Medial Instep 25 cm 29 cm Vascular Assessment Pulses: Dorsalis Pedis Palpable: [Left:Yes] [Right:Yes] Posterior Tibial Palpable: [Left:Yes] [Right:Yes] Electronic Signature(s) Signed: 06/18/2021 12:27:53 PM By: [name] RN Entered By: [name] on 06/17/2021 09:26:58 -------------------------------------------------------------------------------- Multi Wound Chart Details Patient Name: Date of Service: [name], [name] 06/17/2021 9:45 A M Medical Record Number: [id number] Patient Account Number: [id number] Date of Birth/Sex: Treating RN: [birth year] (45 y.o. [name] Primary Care Agustina Witzke: [name] Other Clinician: Referring [name]: Treating Oluwafemi Villella/Extender: [name] in Treatment: 48 Vital  Signs Height(in): 69 Pulse(bpm): 90 Weight(lbs): 55 Blood Pressure(mmHg): 129/85 Body Mass Index(BMI): 41 Temperature(F): 98 Respiratory Rate(breaths/min): 17 Photos: [1:Right Calcaneus] [2:Left Calcaneus] [N/A:N/A N/A] Wound Location: [1:Pressure Injury] [2:Pressure Injury] [N/A:N/A] Wounding Event: [1:Pressure Ulcer] [2:Pressure Ulcer] [N/A:N/A] Primary Etiology: [1:Asthma, Angina, Hypertension] [2:Asthma, Angina, Hypertension] [N/A:N/A] Comorbid History: [1:10/07/2019] [2:10/07/2019] [N/A:N/A] Date Acquired: [1:48] [2:48] [N/A:N/A] Weeks of Treatment: [1:Open] [2:Open] [N/A:N/A] Wound Status: [1:1x0.1x0.2] [2:2.8x0.5x0.4] [N/A:N/A] Measurements L x W x D (cm) [1:0.079] [2:1.1] [N/A:N/A] A (cm) : rea [1:0.016] [2:0.44] [N/A:N/A] Volume (cm) : [1:99.80%] [2:95.90%] [N/A:N/A] % Reduction in A rea: [1:99.90%] [  2:98.40%] [N/A:N/A] % Reduction in Volume: [1:Category/Stage III] [2:Category/Stage III] [N/A:N/A] Classification: [1:Medium] [2:Medium] [N/A:N/A] Exudate A mount: [1:Serosanguineous] [2:Serosanguineous] [N/A:N/A] Exudate Type: [1:red, brown] [2:red, brown] [N/A:N/A] Exudate Color: [1:Thickened] [2:Thickened] [N/A:N/A] Wound Margin: [1:Large (67-100%)] [2:Large (67-100%)] [N/A:N/A] Granulation A mount: [1:Red, Pink] [2:Red, Pink] [N/A:N/A] Granulation Quality: [1:Small (1-33%)] [2:Small (1-33%)] [N/A:N/A] Necrotic A mount: [1:Fat Layer (Subcutaneous Tissue): Yes Fat Layer (Subcutaneous Tissue): Yes N/A] Exposed Structures: [1:Fascia: No Tendon: No Muscle: No Joint: No Bone: No Small (1-33%)] [2:Fascia: No Tendon: No Muscle: No Joint: No Bone: No Small (1-33%)] [N/A:N/A] Epithelialization: [1:Debridement - Selective/Open Wound Debridement - Selective/Open Wound N/A] Debridement: Pre-procedure Verification/Time Out 09:42 [2:09:42] [N/A:N/A] Taken: [1:Lidocaine] [2:Lidocaine] [N/A:N/A] Pain Control: [1:Necrotic/Eschar, Callus] [2:Necrotic/Eschar, Callus] [N/A:N/A] Tissue  Debrided: [1:Skin/Epidermis] [2:Skin/Epidermis] [N/A:N/A] Level: [1:0.1] [2:1.4] [N/A:N/A] Debridement A (sq cm): [1:rea Curette] [2:Curette] [N/A:N/A] Instrument: [1:Minimum] [2:Minimum] [N/A:N/A] Bleeding: [1:Pressure] [2:Pressure] [N/A:N/A] Hemostasis A chieved: [1:0] [2:0] [N/A:N/A] Procedural Pain: [1:0] [2:0] [N/A:N/A] Post Procedural Pain: [1:Procedure was tolerated well] [2:Procedure was tolerated well] [N/A:N/A] Debridement Treatment Response: [1:1x0.1x0.2] [2:2.8x0.5x0.4] [N/A:N/A] Post Debridement Measurements L x W x D (cm) [1:0.016] [2:0.44] [N/A:N/A] Post Debridement Volume: (cm) [1:Category/Stage III] [2:Category/Stage III] [N/A:N/A] Post Debridement Stage: [1:Debridement] [2:Debridement] [N/A:N/A] Treatment Notes Wound #1 (Calcaneus) Wound Laterality: Right Cleanser Normal Saline Discharge Instruction: Cleanse the wound with Normal Saline prior to applying a clean dressing using gauze sponges, not tissue or cotton balls. Wound Cleanser Discharge Instruction: Cleanse the wound with wound cleanser prior to applying a clean dressing using gauze sponges, not tissue or cotton balls. Peri-Wound Care Topical Primary Dressing Iodosorb Gel 10 (gm) Tube Discharge Instruction: Apply to wound bed as instructed Secondary Dressing Woven Gauze Sponge, Non-Sterile 4x4 in Discharge Instruction: Apply over primary dressing as directed. Zetuvit Plus 4x8 in Discharge Instruction: Apply over primary dressing as directed. Secured With Elastic Bandage 4 inch (ACE bandage) Discharge Instruction: Secure with ACE bandage as directed. Kerlix Roll Sterile, 4.5x3.1 (in/yd) Discharge Instruction: Secure with Kerlix as directed. Compression Wrap Compression Stockings Add-Ons Wound #2 (Calcaneus) Wound Laterality: Left Cleanser Normal Saline Discharge Instruction: Cleanse the wound with Normal Saline prior to applying a clean dressing using gauze sponges, not tissue or cotton balls. Wound  Cleanser Discharge Instruction: Cleanse the wound with wound cleanser prior to applying a clean dressing using gauze sponges, not tissue or cotton balls. Peri-Wound Care Topical Primary Dressing Iodosorb Gel 10 (gm) Tube Discharge Instruction: Apply to wound bed as instructed Secondary Dressing Woven Gauze Sponge, Non-Sterile 4x4 in Discharge Instruction: Apply over primary dressing as directed. Zetuvit Plus 4x8 in Discharge Instruction: Apply over primary dressing as directed. Secured With Elastic Bandage 4 inch (ACE bandage) Discharge Instruction: Secure with ACE bandage as directed. Kerlix Roll Sterile, 4.5x3.1 (in/yd) Discharge Instruction: Secure with Kerlix as directed. Compression Wrap Compression Stockings Add-Ons Electronic Signature(s) Signed: 06/17/2021 5:10:51 PM By: Linton Ham MD Signed: 06/17/2021 5:16:08 PM By: [name] RN, BSN Entered By: Linton Ham on 06/17/2021 10:10:38 -------------------------------------------------------------------------------- [place] Details Patient Name: Date of Service: [name], [place] 06/17/2021 9:45 A M Medical Record Number: [id number] Patient Account Number: [id number] Date of Birth/Sex: Treating RN: [birth year] [name] (45 y.o. [name] Primary Care Deziya Amero: [name] Other Clinician: Referring Riyan Haile: Treating Leniya Breit/Extender: [name] in Treatment: 47 Multidisciplinary Care Plan reviewed with physician Active Inactive Abuse / Safety / Falls / Self Care Management Nursing Diagnoses: Potential for falls Goals: Patient/caregiver will verbalize/demonstrate measures taken to prevent injury and/or falls Date Initiated: 07/15/2020 Target Resolution Date: 07/01/2021  Goal Status: Active Interventions: Assess fall risk on admission and as needed Assess impairment of mobility on admission and as needed per policy Notes: Wound/Skin Impairment Nursing  Diagnoses: Impaired tissue integrity Knowledge deficit related to ulceration/compromised skin integrity Goals: Patient/caregiver will verbalize understanding of skin care regimen Date Initiated: 07/15/2020 Target Resolution Date: 07/01/2021 Goal Status: Active Ulcer/skin breakdown will have a volume reduction of 30% by week 4 Date Initiated: 07/15/2020 Date Inactivated: 08/20/2020 Target Resolution Date: 09/03/2020 Goal Status: Unmet Unmet Reason: no major changes. Ulcer/skin breakdown will heal within 14 weeks Date Initiated: 12/04/2020 Date Inactivated: 12/10/2020 Target Resolution Date: 12/10/2020 Unmet Reason: wounds still open at 14 Goal Status: Unmet weeks and today 21 weeks. Interventions: Assess patient/caregiver ability to obtain necessary supplies Assess patient/caregiver ability to perform ulcer/skin care regimen upon admission and as needed Assess ulceration(s) every visit Provide education on ulcer and skin care Treatment Activities: Skin care regimen initiated : 07/15/2020 Topical wound management initiated : 07/15/2020 Notes: Electronic Signature(s) Signed: 06/18/2021 12:27:53 PM By: [name] RN Entered By: [name] on 06/17/2021 09:46:45 -------------------------------------------------------------------------------- Pain Assessment Details Patient Name: Date of Service: [name], [employer] [place] 06/17/2021 9:45 A M Medical Record Number: [id number] Patient Account Number: [id number] Date of Birth/Sex: Treating RN: [birth year] (45 y.o. [name] Primary Care Chanel Mckesson: [name] Other Clinician: Referring Eviana Sibilia: Treating Zareya Tuckett/Extender: [name] in Treatment: 48 Active Problems Location of Pain Severity and Description of Pain Patient Has Paino Yes Site Locations Pain Location: Pain Location: Pain in Ulcers With Dressing Change: Yes Duration of the Pain. Constant / Intermittento Constant Rate the  pain. Current Pain Level: 4 Worst Pain Level: 10 Least Pain Level: 0 Tolerable Pain Level: 4 Character of Pain Describe the Pain: Aching Pain Management and Medication Current Pain Management: Medication: No Cold Application: No Rest: No Massage: No Activity: No T.E.N.S.: No Heat Application: No Leg drop or elevation: No Is the Current Pain Management Adequate: Adequate How does your wound impact your activities of daily livingo Sleep: No Bathing: No Appetite: No Relationship With Others: No Bladder Continence: No Emotions: No Bowel Continence: No Work: No Toileting: No Drive: No Dressing: No Hobbies: No Electronic Signature(s) Signed: 06/18/2021 12:27:53 PM By: [name] RN Entered By: [name] on 06/17/2021 09:23:05 -------------------------------------------------------------------------------- Patient/Caregiver Education Details Patient Name: Date of Service: Ciro Backer 11/10/2022andnbsp9:45 A M Medical Record Number: [id number] Patient Account Number: [id number] Date of Birth/Gender: Treating RN: [birth year] (45 y.o. [name] Primary Care Physician: [name] Other Clinician: Referring Physician: Treating Physician/Extender: [name] in Treatment: 16 Education Assessment Education Provided To: Patient Education Topics Provided Wound/Skin Impairment: Methods: Explain/Verbal Responses: Reinforcements needed, State content correctly [employer]) Signed: 06/18/2021 12:27:53 PM By: [name] RN Entered By: [name] on 06/17/2021 09:47:00 -------------------------------------------------------------------------------- Wound Assessment Details Patient Name: Date of Service: [name], [place] 06/17/2021 9:45 A M Medical Record Number: [id number] Patient Account Number: [id number] Date of Birth/Sex: Treating RN: [name] (45 y.o. [name] Primary Care  Sha Burling: [name] Other Clinician: Referring Kenyon Eichelberger: Treating Chiffon Kittleson/Extender: [name] in Treatment: 48 Wound Status Wound Number: 1 Primary Etiology: Pressure Ulcer Wound Location: Right Calcaneus Wound Status: Open Wounding Event: Pressure Injury Comorbid History: Asthma, Angina, Hypertension Date Acquired: 10/07/2019 Weeks Of Treatment: 48 Clustered Wound: No Photos Wound Measurements Length: (cm) 1 Width: (cm) 0.1 Depth: (cm) 0.2 Area: (cm) 0.079 Volume: (cm) 0.016 % Reduction in Area: 99.8% % Reduction  in Volume: 99.9% Epithelialization: Small (1-33%) Tunneling: No Undermining: No Wound Description Classification: Category/Stage III Wound Margin: Thickened Exudate Amount: Medium Exudate Type: Serosanguineous Exudate Color: red, brown Foul Odor After Cleansing: No Slough/Fibrino No Wound Bed Granulation Amount: Large (67-100%) Exposed Structure Granulation Quality: Red, Pink Fascia Exposed: No Necrotic Amount: Small (1-33%) Fat Layer (Subcutaneous Tissue) Exposed: Yes Necrotic Quality: Adherent Slough Tendon Exposed: No Muscle Exposed: No Joint Exposed: No Bone Exposed: No Treatment Notes Wound #1 (Calcaneus) Wound Laterality: Right Cleanser Normal Saline Discharge Instruction: Cleanse the wound with Normal Saline prior to applying a clean dressing using gauze sponges, not tissue or cotton balls. Wound Cleanser Discharge Instruction: Cleanse the wound with wound cleanser prior to applying a clean dressing using gauze sponges, not tissue or cotton balls. Peri-Wound Care Topical Primary Dressing Iodosorb Gel 10 (gm) Tube Discharge Instruction: Apply to wound bed as instructed Secondary Dressing Woven Gauze Sponge, Non-Sterile 4x4 in Discharge Instruction: Apply over primary dressing as directed. Zetuvit Plus 4x8 in Discharge Instruction: Apply over primary dressing as directed. Secured With Elastic Bandage 4 inch  (ACE bandage) Discharge Instruction: Secure with ACE bandage as directed. Kerlix Roll Sterile, 4.5x3.1 (in/yd) Discharge Instruction: Secure with Kerlix as directed. Compression Wrap Compression Stockings Add-Ons Electronic Signature(s) Signed: 06/17/2021 5:16:08 PM By: [name] RN, BSN Signed: 06/18/2021 12:27:53 PM By: [name] RN Entered By: [name] on 06/17/2021 09:33:50 -------------------------------------------------------------------------------- Wound Assessment Details Patient Name: Date of Service: [name], [place] 06/17/2021 9:45 A M Medical Record Number: [id number] Patient Account Number: [id number] Date of Birth/Sex: Treating RN: [name] (45 y.o. [name] Primary Care Eldin Bonsell: [name] Other Clinician: Referring Dorenda Pfannenstiel: Treating Ann Bohne/Extender: [name] in Treatment: 48 Wound Status Wound Number: 2 Primary Etiology: Pressure Ulcer Wound Location: Left Calcaneus Wound Status: Open Wounding Event: Pressure Injury Comorbid History: Asthma, Angina, Hypertension Date Acquired: 10/07/2019 Weeks Of Treatment: 48 Clustered Wound: No Photos Wound Measurements Length: (cm) 2.8 Width: (cm) 0.5 Depth: (cm) 0.4 Area: (cm) 1.1 Volume: (cm) 0.44 % Reduction in Area: 95.9% % Reduction in Volume: 98.4% Epithelialization: Small (1-33%) Tunneling: No Undermining: No Wound Description Classification: Category/Stage III Wound Margin: Thickened Exudate Amount: Medium Exudate Type: Serosanguineous Exudate Color: red, brown Foul Odor After Cleansing: No Slough/Fibrino No Wound Bed Granulation Amount: Large (67-100%) Exposed Structure Granulation Quality: Red, Pink Fascia Exposed: No Necrotic Amount: Small (1-33%) Fat Layer (Subcutaneous Tissue) Exposed: Yes Necrotic Quality: Adherent Slough Tendon Exposed: No Muscle Exposed: No Joint Exposed: No Bone Exposed: No Treatment Notes Wound #2  (Calcaneus) Wound Laterality: Left Cleanser Normal Saline Discharge Instruction: Cleanse the wound with Normal Saline prior to applying a clean dressing using gauze sponges, not tissue or cotton balls. Wound Cleanser Discharge Instruction: Cleanse the wound with wound cleanser prior to applying a clean dressing using gauze sponges, not tissue or cotton balls. Peri-Wound Care Topical Primary Dressing Iodosorb Gel 10 (gm) Tube Discharge Instruction: Apply to wound bed as instructed Secondary Dressing Woven Gauze Sponge, Non-Sterile 4x4 in Discharge Instruction: Apply over primary dressing as directed. Zetuvit Plus 4x8 in Discharge Instruction: Apply over primary dressing as directed. Secured With Elastic Bandage 4 inch (ACE bandage) Discharge Instruction: Secure with ACE bandage as directed. Kerlix Roll Sterile, 4.5x3.1 (in/yd) Discharge Instruction: Secure with Kerlix as directed. Compression Wrap Compression Stockings Add-Ons Electronic Signature(s) Signed: 06/17/2021 5:16:08 PM By: [name] RN, BSN Signed: 06/18/2021 12:27:53 PM By: [name] RN Entered By: [name] on 06/17/2021 09:34:20 -------------------------------------------------------------------------------- Vitals Details Patient Name: Date  of Service: [name], [place] 06/17/2021 9:45 A M Medical Record Number: [id number] Patient Account Number: [id number] Date of Birth/Sex: Treating RN: [name] (45 y.o. [name] Primary Care Jolly Carlini: [name] Other Clinician: Referring Layne Lebon: Treating Loralie Malta/Extender: [name] in Treatment: 48 Vital Signs Time Taken: 09:23 Temperature (F): 98 Height (in): 69 Pulse (bpm): 90 Weight (lbs): 280 Respiratory Rate (breaths/min): 17 Body Mass Index (BMI): 41.3 Blood Pressure (mmHg): 129/85 Reference Range: 80 - 120 mg / dl Electronic Signature(s) Signed: 06/18/2021 12:27:53 PM By: [name]  RN Entered By: [name] on 06/17/2021 09:24:43

## 2021-06-18 NOTE — Progress Notes (Addendum)
[name] ([id number]) Visit Report for 06/17/2021 Debridement Details Patient Name: Date of Service: [name], [place] 06/17/2021 9:45 A M Medical Record Number: [id number] Patient Account Number: [id number] Date of Birth/Sex: Treating RN: [birth year] (45 y.o. [name] Primary Care Provider: [name] Other Clinician: Referring Provider: Treating Provider/Extender: [name] in Treatment: 48 Debridement Performed for Assessment: Wound #1 Right Calcaneus Performed By: Physician Ricard Dillon., MD Debridement Type: Debridement Level of Consciousness (Pre-procedure): Awake and Alert Pre-procedure Verification/Time Out Yes - 09:42 Taken: Start Time: 09:42 Pain Control: Lidocaine T Area Debrided (L x W): otal 1 (cm) x 0.1 (cm) = 0.1 (cm) Tissue and other material debrided: Viable, Non-Viable, Callus, Eschar, Skin: Dermis , Skin: Epidermis Level: Skin/Epidermis Debridement Description: Selective/Open Wound Instrument: Curette Bleeding: Minimum Hemostasis Achieved: Pressure End Time: 09:42 Procedural Pain: 0 Post Procedural Pain: 0 Response [name] Treatment: Procedure was tolerated well Level of Consciousness (Post- Awake and Alert procedure): Post Debridement Measurements of Total Wound Length: (cm) 1 Stage: Category/Stage III Width: (cm) 0.1 Depth: (cm) 0.2 Volume: (cm) 0.016 Character of Wound/Ulcer Post Debridement: Improved Post Procedure Diagnosis Same as Pre-procedure Electronic Signature(s) Signed: 06/17/2021 5:10:51 PM By: Linton Ham MD Signed: 06/17/2021 5:16:08 PM By: [name] RN, BSN Entered By: Linton Ham on 06/17/2021 10:10:47 -------------------------------------------------------------------------------- Debridement Details Patient Name: Date of Service: [name], [name] [place] E. 06/17/2021 9:45 A M Medical Record Number: [id number] Patient Account Number: [id number] Date of Birth/Sex: Treating  RN: [birth year] (45 y.o. [name] Primary Care Provider: [name] Other Clinician: Referring Provider: Treating Provider/Extender: [name] in Treatment: 48 Debridement Performed for Assessment: Wound #2 Left Calcaneus Performed By: Physician Ricard Dillon., MD Debridement Type: Debridement Level of Consciousness (Pre-procedure): Awake and Alert Pre-procedure Verification/Time Out Yes - 09:42 Taken: Start Time: 09:42 Pain Control: Lidocaine T Area Debrided (L x W): otal 2.8 (cm) x 0.5 (cm) = 1.4 (cm) Tissue and other material debrided: Viable, Non-Viable, Callus, Eschar, Skin: Dermis , Skin: Epidermis Level: Skin/Epidermis Debridement Description: Selective/Open Wound Instrument: Curette Bleeding: Minimum Hemostasis Achieved: Pressure End Time: 09:42 Procedural Pain: 0 Post Procedural Pain: 0 Response [name] Treatment: Procedure was tolerated well Level of Consciousness (Post- Awake and Alert procedure): Post Debridement Measurements of Total Wound Length: (cm) 2.8 Stage: Category/Stage III Width: (cm) 0.5 Depth: (cm) 0.4 Volume: (cm) 0.44 Character of Wound/Ulcer Post Debridement: Improved Post Procedure Diagnosis Same as Pre-procedure Electronic Signature(s) Signed: 06/17/2021 5:10:51 PM By: Linton Ham MD Signed: 06/17/2021 5:16:08 PM By: [name] RN, BSN Entered By: Linton Ham on 06/17/2021 10:10:56 -------------------------------------------------------------------------------- HPI Details Patient Name: Date of Service: [name], [name] [place] E. 06/17/2021 9:45 A M Medical Record Number: [id number] Patient Account Number: [id number] Date of Birth/Sex: Treating RN: February 12, 1976 (45 y.o. [name] Primary Care Provider: [name] Other Clinician: Referring Provider: Treating Provider/Extender: [name] in Treatment: 50 History of Present Illness HPI Description: Wounds are12/03/2020 upon  evaluation today patient presents for initial inspection here in our clinic concerning issues he has been having with the bottoms of his feet bilaterally. He states these actually occurred as wounds when he was hospitalized for 5 months secondary [name] Covid. He was apparently with tilting bed where he was in an upright position quite frequently and apparently this occurred in some way shape or form during that time. Fortunately there is no sign of active infection at this time. No fevers, chills, nausea, vomiting, or diarrhea. With that  being said he still has substantial wounds on the plantar aspects of his feet Theragen require quite a bit of work [name] get these [name] heal. He has been using Santyl currently though that is been problematic both in receiving the medication as well as actually paid for it as it is become quite expensive. Prior [name] the experience with Covid the patient really did not have any major medical problems other than hypertension he does have some mild generalized weakness following the Covid experience. 07/22/2020 on evaluation today patient appears [name] be doing okay in regard [name] his foot ulcers I feel like the wound beds are showing signs of better improvement that I do believe the Iodoflex is helping in this regard. With that being said he does have a lot of drainage currently and this is somewhat blue/green in nature which is consistent with Pseudomonas. I do think a culture today would be appropriate for [place] [name] evaluate and see if that is indeed the case I would likely start him on antibiotic orally as well he is not allergic [name] Cipro knows of no issues he has had in the past 12/21; patient was admitted [name] the clinic earlier this month with bilateral presumed pressure ulcers on the bottom of his feet apparently related [name] excessive pressure from a tilt table arrangement in the intensive care unit. Patient relates this [name] being on ECMO but I am not really sure that is exactly related  [name] that. I must say I have never seen anything like this. He has fairly extensive full-thickness wounds extending from his heel towards his midfoot mostly centered laterally. There is already been some healing distally. He does not appear [name] have an arterial issue. He has been using gentamicin [name] the wound surfaces with Iodoflex [name] help with ongoing debridement 1/6; this is a patient with pressure ulcers on the bottom of his feet related [name] excessive pressure from a standing position in the intensive care unit. He is complaining of a lot of pain in the right heel. He is not a diabetic. 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 We have been using Iodoflex [name] help prepare the surfaces of both wounds for an advanced treatment product. He is nonambulatory spending most of his time in a wheelchair I have asked him not [name] propel the wheelchair with his heels 1/13; in general his wounds look better not much surface area change we have been using Iodoflex as of last week. I did an x-ray of the right heel as the patient was complaining of pain. I had some thoughts about a stress fracture perhaps Achilles tendon problems however what it showed was erosive changes along the inferior aspect of the calcaneus he now has a MRI booked for 1/20. 1/20; in general his wounds continue [name] be better. Some improvement in the large narrow areas proximally in his foot. He is still complaining of pain in the right heel and tenderness in certain areas of this wound. His MRI is tonight. I am not just looking for osteomyelitis that was brought up on the x-ray I am wondering about stress fractures, tendon ruptures etc. He has no such findings on the left. Also noteworthy is that the patient had critical illness neuropathy and some of the discomfort may be actual improvement in nerve function I am just not sure. These wounds were initially in the setting of severe critical illness related [name] COVID-19. He was  put in a standing position. He may have also  been on pressors at the point contributing [name] tissue ischemia. By his description at some point these wounds were grossly necrotic extending proximally up into the Achilles part of his heel. I do not know that I have ever really seen pictures of them like this although they may exist in epic We have ordered Tri layer Oasis. I am trying [name] stimulate some granulation in these areas. This is of course assuming the MRI is negative for infection 1/27; since the patient was last here he saw Dr. Juleen China of infectious disease. He is planned for vancomycin and ceftriaxone. Prior operative culture grew MSSA. Also ordered baseline lab work. He also ordered arterial studies although the ABIs in our clinic were normal as well as his clinical exam these were normal I do not think he needs [name] see vascular surgery. His ABIs at the [facility] were 1.22 in the right triphasic waveforms with a normal TBI of 1.15 on the left ABI of 1.22 with triphasic waveforms and a normal TBI of 1.08. Finally he saw Dr. Amalia Hailey who will follow him in for 2 months. At this point I do not think he felt that he needed a procedure on the right calcaneal bone. Dr. Juleen China is elected for broad-spectrum antibiotic The patient is still having pain in the right heel. He walks with a walker 2/3; wounds are generally smaller. He is tolerating his IV antibiotics. I believe this is vancomycin and ceftriaxone. We are still waiting for Oasis burn in terms of his out-of-pocket max which he should be meeting soon given the IV antibiotics, MRIs etc. I have asked him [name] check in on this. We are using silver collagen in the meantime the wounds look better 2/10; tolerating IV vancomycin and Rocephin. We are waiting [name] apply for Oasis. Although I am not really sure where he is in his out-of-pocket max. 2/17 started the first application of Oasis trilayer. Still on antibiotics. The wounds have generally look better. The  area on the left has a little more surface slough requiring debridement 4/00; second application of Oasis trilayer. The wound surface granulation is generally look better. The area on the left with undermining laterally I think is come in a bit. 10/08/2020 upon evaluation today patient is here today for [employer] application #3. Fortunately he seems [name] be doing extremely well with regard [name] this and we are seeing a lot of new epithelial growth which is great news. Fortunately there is no signs of active infection at this time. 10/16/2020 upon evaluation today patient appears [name] be doing well with regard [name] his foot ulcers. Do believe the Oasis has been of benefit for him. I do not see any signs of infection right now which is great news and I think that he has a lot of new epithelial growth which is great [name] see as well. The patient is very pleased [name] hear all of this. I do think we can proceed with the Oasis trilayer #4 today. 3/18; not as much improvement in these areas on his heels that I was hoping. I did reapply trilateral Oasis today the tissue looks healthier but not as much fill in as I was hoping. 3/25; better looking today I think this is come in a bit the tissue looks healthier. Triple layer Oasis reapplied #6 4/1; somewhat better looking definitely better looking surface not as much change in surface area as I was hoping. He may be spending more time [place] on days then he needs [name] although he  does have heel offloading boots. Triple layer Oasis reapplied #7 4/7; unfortunately apparently [facility] will not approve any further Oasis which is unfortunate since the patient did respond nicely both in terms of the condition of the wound bed as well as surface area. There is still some drainage coming from the wound but not a lot there does not appear [name] be any infection 4/15; we have been using Hydrofera Blue. He continues [name] have nice rims of epithelialization on the right  greater than the left. The left the epithelialization is coming from the tip of his heel. There is moderate drainage. In this that concerns me about a total contact cast. There is no evidence of infection 4/29; patient has been using Hydrofera Blue with dressing changes. He has no complaints or issues today. 5/5; using Hydrofera Blue. I actually think that he looks marginally better than the last time I saw this 3 weeks ago. There are rims of epithelialization on the left thumb coming from the medial side on the right. Using Hydrofera Blue 5/12; using Hydrofera Blue. These continue [name] make improvements in surface area. His drainage was not listed as severe I therefore went ahead and put a cast on the left foot. Right foot we will continue [name] dress his previous 5/16; back for first total contact cast change. He did not tolerate this particularly well cast injury on the anterior tibia among other issues. Difficulty sleeping. I talked him about this in some detail and afterwards is elected [name] continue. I told him I would like [name] have a cast on for 3 weeks [name] see if this is going [name] help at all. I think he agreed 5/19; I think the wound is better. There is no tunneling towards his midfoot. The undermining medially also looks better. He has a rim of new skin distally. I think we are making progress here. The area on the left also continues [name] look somewhat better [name] me using Hydrofera Blue. He has a list of complaints about the cast but none of them seem serious 5/26; patient presents for 1 week follow-up. He has been using a total contact cast and tolerating this well. Hydrofera Blue is the main dressing used. He denies signs of infection. 6/2 Hydrofera Blue total contact cast on the left. These were large ulcers that formed in intensive care unit where the patient was recovering from [name]. May have had something to do with being ventilated in an upright positiono Pressors etc. We have been able [name] get  the areas down considerably and a viable surface. There is some epithelialization in both sides. Note made of drainage 6/9; changed [name] [facility] last time because of drainage. He arrives with better looking surfaces and dimensions on the left than the right. Paradoxically the right actually probes more towards his midfoot the left is largely close down but both of these look improved. Using a total contact cast on the left 6/16; complex wounds on his bilateral plantar heels which were initially pressure injury from a stay in the ICU with COVID. We have been using silver alginate most recently. His dimensions of come in quite dramatically however not recently. We have been putting the left foot in a total contact cast 6/23; complex wounds on the bilateral plantar heels. I been putting the left in the cast paradoxically the area on the right is the one that is going towards closure at a faster rate. Quite a bit of drainage on the left.  The patient went [name] see Dr. Amalia Hailey who said he was going [name] standby for skin grafts. I had actually considered sending him for skin grafts however he would be mandatorily off his feet for a period of weeks [name] months. I am thinking that the area on the right is going [name] close on its own the area on the left has been more stubborn even though we have him in a total contact cast 6/30; took him out of a total contact cast last week is the right heel seem [name] be making better progress than the left where I was placing the cast. We are using silver alginate. Both wounds are smaller right greater than left 7/12; both wounds look as though they are making some progress. We are using silver alginate. Heel offloading boots 7/26; very gradual progress especially on the right. Using silver alginate. He is wearing heel offloading boots 8/18; he continues [name] close these wounds down very gradually. Using silver alginate. The problem polymen being definitive about this is areas of  what appears [name] be callus around the margins. This is not a 100% of the area but certainly sizable especially on the right 9/1; bilateral plantar feet wounds secondary [name] prolonged pressure while being ventilated for COVID-19 in an upright position. Essentially pressure ulcers on the bottom of his feet. He is made substantial progress using silver alginate. 9/14; bilateral plantar feet wounds secondary [name] prolonged pressure. Making progress using silver alginate. 9/29 bilateral plantar feet wounds secondary [name] prolonged pressure. I changed him [name] Iodoflex last week. MolecuLight showing reddened blush fluorescence 10/11; patient presents for follow-up. He has no issues or complaints today. He denies signs of infection. He continues [name] use Iodoflex and antibiotic ointment [name] the wound beds. 10/27; 2-week follow-up. No evidence of infection. He has callus and thick dry skin around the wound margins we have been using Iodoflex and Bactroban which was in response [name] a moderate left MolecuLight reddish blush fluorescence. 11/10; 2-week follow-up. Wound margins again have thick callus however the measurements of the actual wound sites are a lot smaller. Everything looks reasonably healthy here. We have been using Iodoflex He was approved for prime matrix but I have elected [name] delay this given the improvement in the surface area. Hopefully I will not regret that decision as were getting close [name] the end of the year in terms of insurance payment Electronic Signature(s) Signed: 06/17/2021 5:10:51 PM By: Linton Ham MD Entered By: Linton Ham on 06/17/2021 10:12:05 -------------------------------------------------------------------------------- Physical Exam Details Patient Name: Date of Service: [name], [place] 06/17/2021 9:45 A M Medical Record Number: [id number] Patient Account Number: [id number] Date of Birth/Sex: Treating RN: November 11, 1975 (45 y.o. [name] Primary Care Provider:  [name] Other Clinician: Referring Provider: Treating Provider/Extender: [name] in Treatment: 41 Constitutional Sitting or standing Blood Pressure is within target range for patient.. Pulse regular and within target range for patient.Marland Kitchen Respirations regular, non-labored and within target range.. Temperature is normal and within the target range for the patient.Marland Kitchen Appears in no distress. Cardiovascular Pedal pulses are palpable bilaterally. Notes Wound exam; bilateral plantar heel wounds extending towards the midfoot. All of this looks a lot better. On the right side there is a small open area distally the rest of this appears [name] be epithelialized. Thick callus around the margins of the wounds on both sides I remove this with a #5 curette. The actual genesis of this is not really clear as I do  not see that much drainage. He has skin over bone there is no fat pad on the plantar heel. Currently no evidence of infection Electronic Signature(s) Signed: 06/17/2021 5:10:51 PM By: Linton Ham MD Entered By: Linton Ham on 06/17/2021 10:13:29 -------------------------------------------------------------------------------- Physician Orders Details Patient Name: Date of Service: [name], [place] 06/17/2021 9:45 A M Medical Record Number: [id number] Patient Account Number: [id number] Date of Birth/Sex: Treating RN: 17-Oct-1975 (45 y.o. [name] Primary Care Provider: [name] Other Clinician: Referring Provider: Treating Provider/Extender: [name] in Treatment: 49 Verbal / Phone Orders: No Diagnosis Coding Follow-up Appointments Return appointment in 3 weeks. - Dr. Dellia Nims Bathing/ Shower/ Hygiene May shower with protection but do not get wound dressing(s) wet. Edema Control - Lymphedema / SCD / Other Bilateral Lower Extremities Elevate legs [name] the level of the heart or above for 30 minutes daily and/or when sitting,  a frequency of: - throughout the day Avoid standing for long periods of time. Moisturize legs daily. - right leg and foot every night. Off-Loading Wedge shoe [name]: - Glophed Shoe [name] both feet. Other: - keep pressure off of the bottom of your feet Additional Orders / Instructions Follow Nutritious Diet Wound Treatment Wound #1 - Calcaneus Wound Laterality: Right Cleanser: Normal Saline (DME) (Generic) Every Other Day/30 Days Discharge Instructions: Cleanse the wound with Normal Saline prior [name] applying a clean dressing using gauze sponges, not tissue or cotton balls. Cleanser: Wound Cleanser Every Other Day/30 Days Discharge Instructions: Cleanse the wound with wound cleanser prior [name] applying a clean dressing using gauze sponges, not tissue or cotton balls. Prim Dressing: Iodosorb Gel 10 (gm) Tube (DME) (Generic) Every Other Day/30 Days ary Discharge Instructions: Apply [name] wound bed as instructed Secondary Dressing: Woven Gauze Sponge, Non-Sterile 4x4 in (DME) (Generic) Every Other Day/30 Days Discharge Instructions: Apply over primary dressing as directed. Secondary Dressing: Zetuvit Plus 4x8 in (DME) (Generic) Every Other Day/30 Days Discharge Instructions: Apply over primary dressing as directed. Secured With: Elastic Bandage 4 inch (ACE bandage) (DME) (Generic) Every Other Day/30 Days Discharge Instructions: Secure with ACE bandage as directed. Secured With: [employer], 4.5x3.1 (in/yd) (DME) (Generic) Every Other Day/30 Days Discharge Instructions: Secure with Kerlix as directed. Wound #2 - Calcaneus Wound Laterality: Left Cleanser: Normal Saline (DME) (Generic) Every Other Day/30 Days Discharge Instructions: Cleanse the wound with Normal Saline prior [name] applying a clean dressing using gauze sponges, not tissue or cotton balls. Cleanser: Wound Cleanser Every Other Day/30 Days Discharge Instructions: Cleanse the wound with wound cleanser prior [name] applying a clean dressing using  gauze sponges, not tissue or cotton balls. Prim Dressing: Iodosorb Gel 10 (gm) Tube (DME) (Generic) Every Other Day/30 Days ary Discharge Instructions: Apply [name] wound bed as instructed Secondary Dressing: Woven Gauze Sponge, Non-Sterile 4x4 in (DME) (Generic) Every Other Day/30 Days Discharge Instructions: Apply over primary dressing as directed. Secondary Dressing: Zetuvit Plus 4x8 in (DME) (Generic) Every Other Day/30 Days Discharge Instructions: Apply over primary dressing as directed. Secured With: Elastic Bandage 4 inch (ACE bandage) (DME) (Generic) Every Other Day/30 Days Discharge Instructions: Secure with ACE bandage as directed. Secured With: [employer], 4.5x3.1 (in/yd) (DME) (Generic) Every Other Day/30 Days Discharge Instructions: Secure with Kerlix as directed. Electronic Signature(s) Signed: 07/15/2021 4:05:59 PM By: Linton Ham MD Signed: 08/26/2021 12:38:30 PM By: [name] RN Previous Signature: 06/17/2021 5:10:51 PM Version By: Linton Ham MD Previous Signature: 06/18/2021 12:27:53 PM Version By: [name] RN Entered By: [name]  on 07/14/2021 08:33:39 -------------------------------------------------------------------------------- Problem List Details Patient Name: Date of Service: [name], [place] 06/17/2021 9:45 A M Medical Record Number: [id number] Patient Account Number: [id number] Date of Birth/Sex: Treating RN: 04/25/1976 (45 y.o. [name] Primary Care Provider: [name] Other Clinician: Referring Provider: Treating Provider/Extender: [name] in Treatment: 48 Active Problems ICD-10 Encounter Code Description Active Date MDM Diagnosis [phone] Non-pressure chronic ulcer of other part of right foot with fat layer exposed 09/03/2020 No Yes L97.522 Non-pressure chronic ulcer of other 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 09/03/2020 No Yes Inactive Problems ICD-10 Code Description  Active Date Inactive Date M62.81 Muscle weakness (generalized) 07/15/2020 07/15/2020 I10 Essential (primary) hypertension 07/15/2020 07/15/2020 M86.171 Other acute osteomyelitis, right ankle and foot 09/03/2020 09/03/2020 L89.893 Pressure ulcer of other site, stage 3 07/15/2020 07/15/2020 Resolved Problems Electronic Signature(s) Signed: 06/17/2021 5:10:51 PM By: Linton Ham MD Entered By: Linton Ham on 06/17/2021 10:10:28 -------------------------------------------------------------------------------- Progress Note Details Patient Name: Date of Service: [name], [place] 06/17/2021 9:45 A M Medical Record Number: [id number] Patient Account Number: [id number] Date of Birth/Sex: Treating RN: 1975/08/10 (45 y.o. [name] Primary Care Provider: [name] Other Clinician: Referring Provider: Treating Provider/Extender: [name] in Treatment: 48 Subjective History of Present Illness (HPI) Wounds are12/03/2020 upon evaluation today patient presents for initial inspection here in our clinic concerning issues he has been having with the bottoms of his feet bilaterally. He states these actually occurred as wounds when he was hospitalized for 5 months secondary [name] Covid. He was apparently with tilting bed where he was in an upright position quite frequently and apparently this occurred in some way shape or form during that time. Fortunately there is no sign of active infection at this time. No fevers, chills, nausea, vomiting, or diarrhea. With that being said he still has substantial wounds on the plantar aspects of his feet Theragen require quite a bit of work [name] get these [name] heal. He has been using Santyl currently though that is been problematic both in receiving the medication as well as actually paid for it as it is become quite expensive. Prior [name] the experience with Covid the patient really did not have any major medical problems other than hypertension he  does have some mild generalized weakness following the Covid experience. 07/22/2020 on evaluation today patient appears [name] be doing okay in regard [name] his foot ulcers I feel like the wound beds are showing signs of better improvement that I do believe the Iodoflex is helping in this regard. With that being said he does have a lot of drainage currently and this is somewhat blue/green in nature which is consistent with Pseudomonas. I do think a culture today would be appropriate for [place] [name] evaluate and see if that is indeed the case I would likely start him on antibiotic orally as well he is not allergic [name] Cipro knows of no issues he has had in the past 12/21; patient was admitted [name] the clinic earlier this month with bilateral presumed pressure ulcers on the bottom of his feet apparently related [name] excessive pressure from a tilt table arrangement in the intensive care unit. Patient relates this [name] being on ECMO but I am not really sure that is exactly related [name] that. I must say I have never seen anything like this. He has fairly extensive full-thickness wounds extending from his heel towards his midfoot mostly centered laterally. There is already been some  healing distally. He does not appear [name] have an arterial issue. He has been using gentamicin [name] the wound surfaces with Iodoflex [name] help with ongoing debridement 1/6; this is a patient with pressure ulcers on the bottom of his feet related [name] excessive pressure from a standing position in the intensive care unit. He is complaining of a lot of pain in the right heel. He is not a diabetic. 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 We have been using Iodoflex [name] help prepare the surfaces of both wounds for an advanced treatment product. He is nonambulatory spending most of his time in a wheelchair I have asked him not [name] propel the wheelchair with his heels 1/13; in general his wounds look better not much surface area change we  have been using Iodoflex as of last week. I did an x-ray of the right heel as the patient was complaining of pain. I had some thoughts about a stress fracture perhaps Achilles tendon problems however what it showed was erosive changes along the inferior aspect of the calcaneus he now has a MRI booked for 1/20. 1/20; in general his wounds continue [name] be better. Some improvement in the large narrow areas proximally in his foot. He is still complaining of pain in the right heel and tenderness in certain areas of this wound. His MRI is tonight. I am not just looking for osteomyelitis that was brought up on the x-ray I am wondering about stress fractures, tendon ruptures etc. He has no such findings on the left. Also noteworthy is that the patient had critical illness neuropathy and some of the discomfort may be actual improvement in nerve function I am just not sure. These wounds were initially in the setting of severe critical illness related [name] COVID-19. He was put in a standing position. He may have also been on pressors at the point contributing [name] tissue ischemia. By his description at some point these wounds were grossly necrotic extending proximally up into the Achilles part of his heel. I do not know that I have ever really seen pictures of them like this although they may exist in epic We have ordered Tri layer Oasis. I am trying [name] stimulate some granulation in these areas. This is of course assuming the MRI is negative for infection 1/27; since the patient was last here he saw Dr. Juleen China of infectious disease. He is planned for vancomycin and ceftriaxone. Prior operative culture grew MSSA. Also ordered baseline lab work. He also ordered arterial studies although the ABIs in our clinic were normal as well as his clinical exam these were normal I do not think he needs [name] see vascular surgery. His ABIs at the [facility] were 1.22 in the right triphasic waveforms with a normal TBI of 1.15 on the left ABI  of 1.22 with triphasic waveforms and a normal TBI of 1.08. Finally he saw Dr. Amalia Hailey who will follow him in for 2 months. At this point I do not think he felt that he needed a procedure on the right calcaneal bone. Dr. Juleen China is elected for broad-spectrum antibiotic The patient is still having pain in the right heel. He walks with a walker 2/3; wounds are generally smaller. He is tolerating his IV antibiotics. I believe this is vancomycin and ceftriaxone. We are still waiting for Oasis burn in terms of his out-of-pocket max which he should be meeting soon given the IV antibiotics, MRIs etc. I have asked him [name] check in on this.  We are using silver collagen in the meantime the wounds look better 2/10; tolerating IV vancomycin and Rocephin. We are waiting [name] apply for Oasis. Although I am not really sure where he is in his out-of-pocket max. 2/17 started the first application of Oasis trilayer. Still on antibiotics. The wounds have generally look better. The area on the left has a little more surface slough requiring debridement 9/56; second application of Oasis trilayer. The wound surface granulation is generally look better. The area on the left with undermining laterally I think is come in a bit. 10/08/2020 upon evaluation today patient is here today for [employer] application #3. Fortunately he seems [name] be doing extremely well with regard [name] this and we are seeing a lot of new epithelial growth which is great news. Fortunately there is no signs of active infection at this time. 10/16/2020 upon evaluation today patient appears [name] be doing well with regard [name] his foot ulcers. Do believe the Oasis has been of benefit for him. I do not see any signs of infection right now which is great news and I think that he has a lot of new epithelial growth which is great [name] see as well. The patient is very pleased [name] hear all of this. I do think we can proceed with the Oasis trilayer #4 today. 3/18; not as  much improvement in these areas on his heels that I was hoping. I did reapply trilateral Oasis today the tissue looks healthier but not as much fill in as I was hoping. 3/25; better looking today I think this is come in a bit the tissue looks healthier. Triple layer Oasis reapplied #6 4/1; somewhat better looking definitely better looking surface not as much change in surface area as I was hoping. He may be spending more time [place] on days then he needs [name] although he does have heel offloading boots. Triple layer Oasis reapplied #7 4/7; unfortunately apparently [facility] will not approve any further Oasis which is unfortunate since the patient did respond nicely both in terms of the condition of the wound bed as well as surface area. There is still some drainage coming from the wound but not a lot there does not appear [name] be any infection 4/15; we have been using Hydrofera Blue. He continues [name] have nice rims of epithelialization on the right greater than the left. The left the epithelialization is coming from the tip of his heel. There is moderate drainage. In this that concerns me about a total contact cast. There is no evidence of infection 4/29; patient has been using Hydrofera Blue with dressing changes. He has no complaints or issues today. 5/5; using Hydrofera Blue. I actually think that he looks marginally better than the last time I saw this 3 weeks ago. There are rims of epithelialization on the left thumb coming from the medial side on the right. Using Hydrofera Blue 5/12; using Hydrofera Blue. These continue [name] make improvements in surface area. His drainage was not listed as severe I therefore went ahead and put a cast on the left foot. Right foot we will continue [name] dress his previous 5/16; back for first total contact cast change. He did not tolerate this particularly well cast injury on the anterior tibia among other issues. Difficulty sleeping. I talked him about this in  some detail and afterwards is elected [name] continue. I told him I would like [name] have a cast on for 3 weeks [name] see if this is  going [name] help at all. I think he agreed 5/19; I think the wound is better. There is no tunneling towards his midfoot. The undermining medially also looks better. He has a rim of new skin distally. I think we are making progress here. The area on the left also continues [name] look somewhat better [name] me using Hydrofera Blue. He has a list of complaints about the cast but none of them seem serious 5/26; patient presents for 1 week follow-up. He has been using a total contact cast and tolerating this well. Hydrofera Blue is the main dressing used. He denies signs of infection. 6/2 Hydrofera Blue total contact cast on the left. These were large ulcers that formed in intensive care unit where the patient was recovering from [place]. May have had something to do with being ventilated in an upright positiono Pressors etc. We have been able [name] get the areas down considerably and a viable surface. There is some epithelialization in both sides. Note made of drainage 6/9; changed [name] [facility] last time because of drainage. He arrives with better looking surfaces and dimensions on the left than the right. Paradoxically the right actually probes more towards his midfoot the left is largely close down but both of these look improved. Using a total contact cast on the left 6/16; complex wounds on his bilateral plantar heels which were initially pressure injury from a stay in the ICU with COVID. We have been using silver alginate most recently. His dimensions of come in quite dramatically however not recently. We have been putting the left foot in a total contact cast 6/23; complex wounds on the bilateral plantar heels. I been putting the left in the cast paradoxically the area on the right is the one that is going towards closure at a faster rate. Quite a bit of drainage on the left. The patient  went [name] see Dr. Amalia Hailey who said he was going [name] standby for skin grafts. I had actually considered sending him for skin grafts however he would be mandatorily off his feet for a period of weeks [name] months. I am thinking that the area on the right is going [name] close on its own the area on the left has been more stubborn even though we have him in a total contact cast 6/30; took him out of a total contact cast last week is the right heel seem [name] be making better progress than the left where I was placing the cast. We are using silver alginate. Both wounds are smaller right greater than left 7/12; both wounds look as though they are making some progress. We are using silver alginate. Heel offloading boots 7/26; very gradual progress especially on the right. Using silver alginate. He is wearing heel offloading boots 8/18; he continues [name] close these wounds down very gradually. Using silver alginate. The problem polymen being definitive about this is areas of what appears [name] be callus around the margins. This is not a 100% of the area but certainly sizable especially on the right 9/1; bilateral plantar feet wounds secondary [name] prolonged pressure while being ventilated for COVID-19 in an upright position. Essentially pressure ulcers on the bottom of his feet. He is made substantial progress using silver alginate. 9/14; bilateral plantar feet wounds secondary [name] prolonged pressure. Making progress using silver alginate. 9/29 bilateral plantar feet wounds secondary [name] prolonged pressure. I changed him [name] Iodoflex last week. MolecuLight showing reddened blush fluorescence 10/11; patient presents for follow-up. He has no issues or  complaints today. He denies signs of infection. He continues [name] use Iodoflex and antibiotic ointment [name] the wound beds. 10/27; 2-week follow-up. No evidence of infection. He has callus and thick dry skin around the wound margins we have been using Iodoflex and Bactroban which was in  response [name] a moderate left MolecuLight reddish blush fluorescence. 11/10; 2-week follow-up. Wound margins again have thick callus however the measurements of the actual wound sites are a lot smaller. Everything looks reasonably healthy here. We have been using Iodoflex He was approved for prime matrix but I have elected [name] delay this given the improvement in the surface area. Hopefully I will not regret that decision as were getting close [name] the end of the year in terms of insurance payment Objective Constitutional Sitting or standing Blood Pressure is within target range for patient.. Pulse regular and within target range for patient.Marland Kitchen Respirations regular, non-labored and within target range.. Temperature is normal and within the target range for the patient.Marland Kitchen Appears in no distress. Vitals Time Taken: 9:23 AM, Height: 69 in, Weight: 280 lbs, BMI: 41.3, Temperature: 98 F, Pulse: 90 bpm, Respiratory Rate: 17 breaths/min, Blood Pressure: 129/85 mmHg. Cardiovascular Pedal pulses are palpable bilaterally. General Notes: Wound exam; bilateral plantar heel wounds extending towards the midfoot. All of this looks a lot better. On the right side there is a small open area distally the rest of this appears [name] be epithelialized. Thick callus around the margins of the wounds on both sides I remove this with a #5 curette. The actual genesis of this is not really clear as I do not see that much drainage. He has skin over bone there is no fat pad on the plantar heel. Currently no evidence of infection Integumentary (Hair, Skin) Wound #1 status is Open. Original cause of wound was Pressure Injury. The date acquired was: 10/07/2019. The wound has been in treatment 48 weeks. The wound is located on the Right Calcaneus. The wound measures 1cm length x 0.1cm width x 0.2cm depth; 0.079cm^2 area and 0.016cm^3 volume. There is Fat Layer (Subcutaneous Tissue) exposed. There is no tunneling or undermining noted. There  is a medium amount of serosanguineous drainage noted. The wound margin is thickened. There is large (67-100%) red, pink granulation within the wound bed. There is a small (1-33%) amount of necrotic tissue within the wound bed including Adherent Slough. Wound #2 status is Open. Original cause of wound was Pressure Injury. The date acquired was: 10/07/2019. The wound has been in treatment 48 weeks. The wound is located on the Left Calcaneus. The wound measures 2.8cm length x 0.5cm width x 0.4cm depth; 1.1cm^2 area and 0.44cm^3 volume. There is Fat Layer (Subcutaneous Tissue) exposed. There is no tunneling or undermining noted. There is a medium amount of serosanguineous drainage noted. The wound margin is thickened. There is large (67-100%) red, pink granulation within the wound bed. There is a small (1-33%) amount of necrotic tissue within the wound bed including Adherent Slough. Assessment Active Problems ICD-10 Non-pressure chronic ulcer of other part of right foot with fat layer exposed Non-pressure chronic ulcer of other 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 Procedures Wound #1 Pre-procedure diagnosis of Wound #1 is a Pressure Ulcer located on the Right Calcaneus . There was a Selective/Open Wound Skin/Epidermis Debridement with a total area of 0.1 sq cm performed by Ricard Dillon., MD. With the following instrument(s): Curette [name] remove Viable and Non-Viable tissue/material. Material removed includes Eschar, Callus, Skin: Dermis, and Skin: Epidermis  after achieving pain control using Lidocaine. No specimens were taken. A time out was conducted at 09:42, prior [name] the start of the procedure. A Minimum amount of bleeding was controlled with Pressure. The procedure was tolerated well with a pain level of 0 throughout and a pain level of 0 following the procedure. Post Debridement Measurements: 1cm length x 0.1cm width x 0.2cm depth; 0.016cm^3 volume. Post debridement Stage noted as  Category/Stage III. Character of Wound/Ulcer Post Debridement is improved. Post procedure Diagnosis Wound #1: Same as Pre-Procedure Wound #2 Pre-procedure diagnosis of Wound #2 is a Pressure Ulcer located on the Left Calcaneus . There was a Selective/Open Wound Skin/Epidermis Debridement with a total area of 1.4 sq cm performed by Ricard Dillon., MD. With the following instrument(s): Curette [name] remove Viable and Non-Viable tissue/material. Material removed includes Eschar, Callus, Skin: Dermis, and Skin: Epidermis after achieving pain control using Lidocaine. No specimens were taken. A time out was conducted at 09:42, prior [name] the start of the procedure. A Minimum amount of bleeding was controlled with Pressure. The procedure was tolerated well with a pain level of 0 throughout and a pain level of 0 following the procedure. Post Debridement Measurements: 2.8cm length x 0.5cm width x 0.4cm depth; 0.44cm^3 volume. Post debridement Stage noted as Category/Stage III. Character of Wound/Ulcer Post Debridement is improved. Post procedure Diagnosis Wound #2: Same as Pre-Procedure Plan Follow-up Appointments: Return appointment in 3 weeks. - Dr. Dellia Nims Bathing/ Shower/ Hygiene: May shower with protection but do not get wound dressing(s) wet. Edema Control - Lymphedema / SCD / Other: Elevate legs [name] the level of the heart or above for 30 minutes daily and/or when sitting, a frequency of: - throughout the day Avoid standing for long periods of time. Moisturize legs daily. - right leg and foot every night. Off-Loading: Wedge shoe [name]: - Glophed Shoe [name] both feet. Other: - keep pressure off of the bottom of your feet Additional Orders / Instructions: Follow Nutritious Diet WOUND #1: - Calcaneus Wound Laterality: Right Cleanser: Normal Saline (Generic) Every Other Day/30 Days Discharge Instructions: Cleanse the wound with Normal Saline prior [name] applying a clean dressing using gauze sponges, not  tissue or cotton balls. Cleanser: Wound Cleanser Every Other Day/30 Days Discharge Instructions: Cleanse the wound with wound cleanser prior [name] applying a clean dressing using gauze sponges, not tissue or cotton balls. Prim Dressing: Iodosorb Gel 10 (gm) Tube (Generic) Every Other Day/30 Days ary Discharge Instructions: Apply [name] wound bed as instructed Secondary Dressing: Woven Gauze Sponge, Non-Sterile 4x4 in Every Other Day/30 Days Discharge Instructions: Apply over primary dressing as directed. Secondary Dressing: Zetuvit Plus 4x8 in (Generic) Every Other Day/30 Days Discharge Instructions: Apply over primary dressing as directed. Secured With: Elastic Bandage 4 inch (ACE bandage) Every Other Day/30 Days Discharge Instructions: Secure with ACE bandage as directed. Secured With: [employer], 4.5x3.1 (in/yd) (Generic) Every Other Day/30 Days Discharge Instructions: Secure with Kerlix as directed. WOUND #2: - Calcaneus Wound Laterality: Left Cleanser: Normal Saline (Generic) Every Other Day/30 Days Discharge Instructions: Cleanse the wound with Normal Saline prior [name] applying a clean dressing using gauze sponges, not tissue or cotton balls. Cleanser: Wound Cleanser Every Other Day/30 Days Discharge Instructions: Cleanse the wound with wound cleanser prior [name] applying a clean dressing using gauze sponges, not tissue or cotton balls. Prim Dressing: Iodosorb Gel 10 (gm) Tube (Generic) Every Other Day/30 Days ary Discharge Instructions: Apply [name] wound bed as instructed Secondary Dressing: Woven Gauze Sponge, Non-Sterile 4x4  in Every Other Day/30 Days Discharge Instructions: Apply over primary dressing as directed. Secondary Dressing: Zetuvit Plus 4x8 in (Generic) Every Other Day/30 Days Discharge Instructions: Apply over primary dressing as directed. Secured With: Elastic Bandage 4 inch (ACE bandage) Every Other Day/30 Days Discharge Instructions: Secure with ACE bandage as  directed. Secured With: [employer], 4.5x3.1 (in/yd) (Generic) Every Other Day/30 Days Discharge Instructions: Secure with Kerlix as directed. #1 I continued with the Iodoflex for now. No need for Bactroban I think we stopped that last week. 2. I have put the pry matrix on hold and I am actually forecasting that this may be closed. I have put him out till after Thanksgiving 3. He is going [name] require insoles and probably heel pad protectors. He is going [name] need modified footwear of some form. His wife reminds me that he is going [name] require AFO braces because of the foot drop 4. The real issue from a wound care point of view is that he has no subcutaneous tissue over bone. I am not sure how much this area is going [name] take when he starts [name] resume some semblance of normal activity. He is definitely going [name] require insoles for his shoes and probably some form of heel padding and what ever shoes he choses [name] wear 5. Follow-up in 3 weeks 6. As noted earlier he has had so much improvement I do not think the use of primatrix is going [name] be necessary Electronic Signature(s) Signed: 06/17/2021 5:10:51 PM By: Linton Ham MD Entered By: Linton Ham on 06/17/2021 10:16:58 -------------------------------------------------------------------------------- SuperBill Details Patient Name: Date of Service: [name], [place]. 06/17/2021 Medical Record Number: [id number] Patient Account Number: [id number] Date of Birth/Sex: Treating RN: 04/17/1976 (45 y.o. [name], [name] Primary Care Provider: [name] Other Clinician: Referring Provider: Treating Provider/Extender: [name] in Treatment: 48 Diagnosis Coding ICD-10 Codes Code Description [phone] Non-pressure chronic ulcer of other part of right foot with fat layer exposed L89.893 Pressure ulcer of other site, stage 3 L97.522 Non-pressure chronic ulcer of other part of left foot with fat layer  exposed Facility Procedures CPT4 Code: [id number] Description: [phone] - DEBRIDE WOUND 1ST 20 SQ CM OR < ICD-10 Diagnosis Description L97.512 Non-pressure chronic ulcer of other part of right foot with fat layer exposed L97.522 Non-pressure chronic ulcer of other 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 Modifier: Quantity: 1 Physician Procedures : CPT4 Code Description Modifier [id number] - WC PHYS DEBR WO ANESTH 20 SQ CM ICD-10 Diagnosis Description L97.512 Non-pressure chronic ulcer of other part of right foot with fat layer exposed L97.522 Non-pressure chronic ulcer of other part of left  foot with fat layer exposed Quantity: 1 Electronic Signature(s) Signed: 06/17/2021 5:10:51 PM By: Linton Ham MD Entered By: Linton Ham on 06/17/2021 10:17:07

## 2021-06-22 DIAGNOSIS — R051 Acute cough: Secondary | ICD-10-CM | POA: Diagnosis not present

## 2021-06-23 IMAGING — CT CT ABD-PELV W/ CM
2 of 5 series · 17 of 46 positions shown, 19 images · IV contrast (APPLIED)
Comparison: None.

CLINICAL DATA: Per ed notes: Pt was in the car and out of no where
gets this [DATE] pain on the right side that radiates to his back.

EXAM:
CT ABDOMEN AND PELVIS WITH CONTRAST
TECHNIQUE: Multidetector CT imaging of the abdomen and pelvis was performed
using the standard protocol following bolus administration of
intravenous contrast.
CONTRAST:  100mL OMNIPAQUE IOHEXOL 300 MG/ML  SOLN

[Series 3: abd/ pelvis 5.0 i30f 2 · axial · 0.98mm/px · z∈[+702,+1207]mm · 14 of 113 slices shown, 16 images]
[im 6/113  soft-tissue]
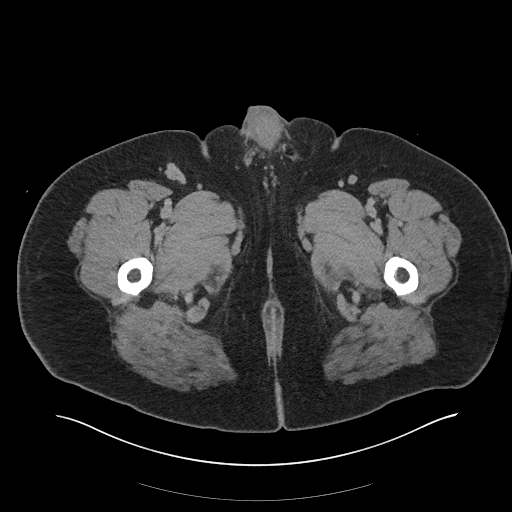
[im 6/113  bone]
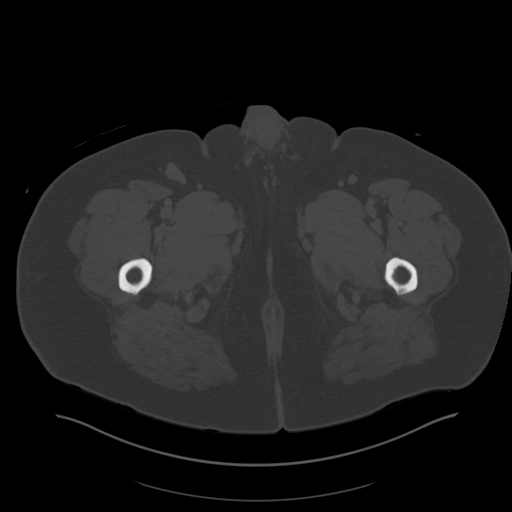
[im 12/113  soft-tissue]
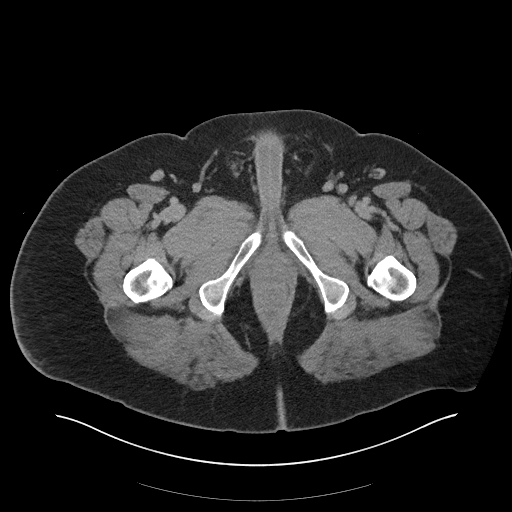
[im 24/113  soft-tissue]
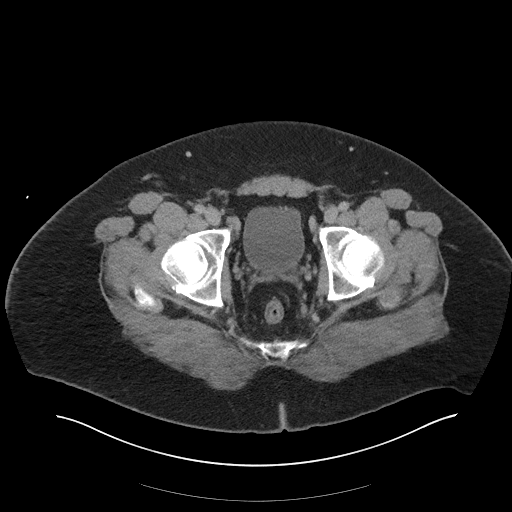
[im 30/113  soft-tissue]
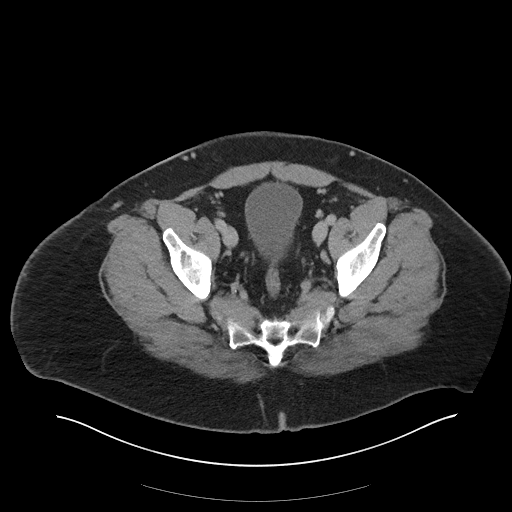
[im 36/113  soft-tissue]
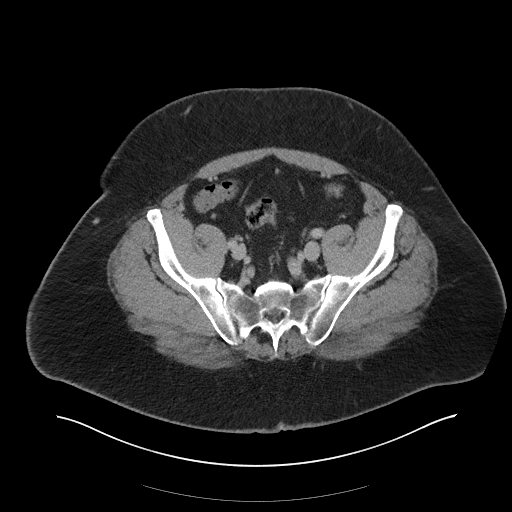
[im 48/113  soft-tissue]
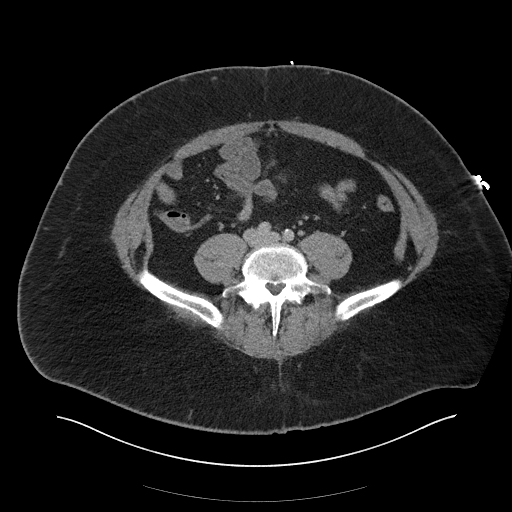
[im 54/113  soft-tissue]
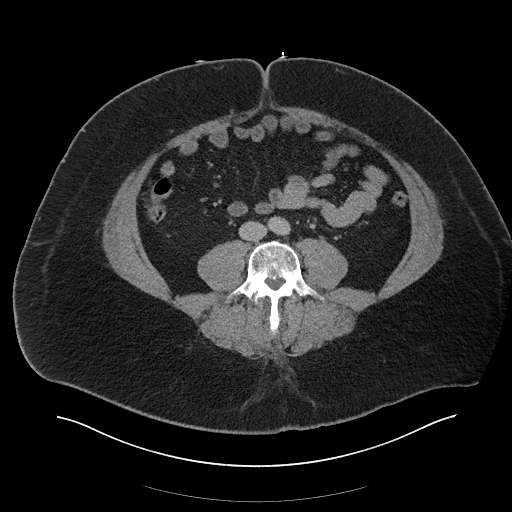
[im 59/113  soft-tissue]
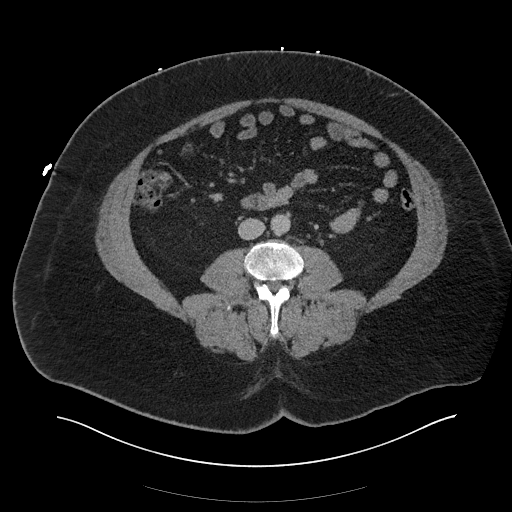
[im 65/113  soft-tissue]
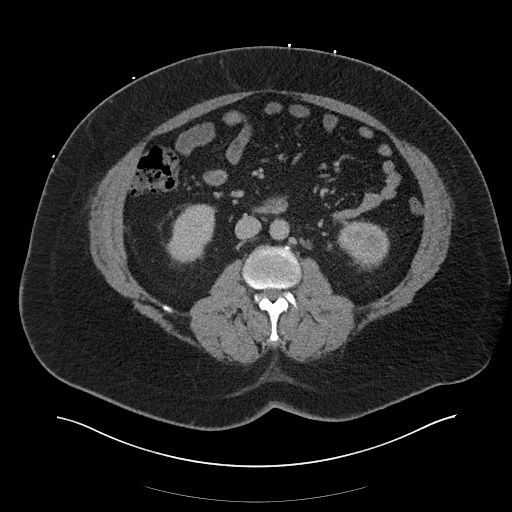
[im 65/113  bone]
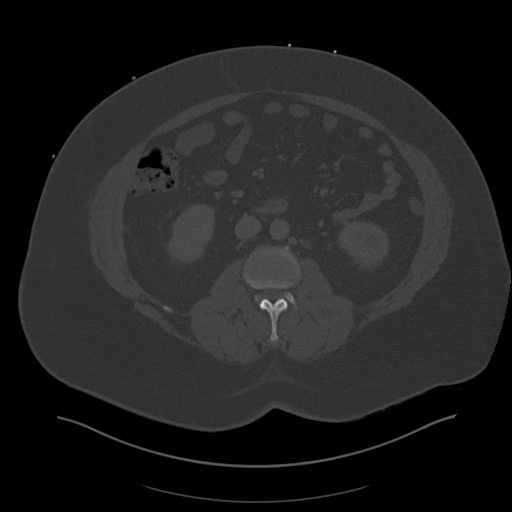
[im 77/113  soft-tissue]
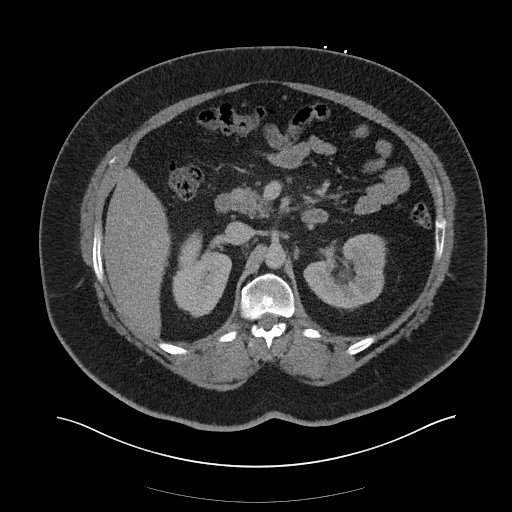
[im 83/113  soft-tissue]
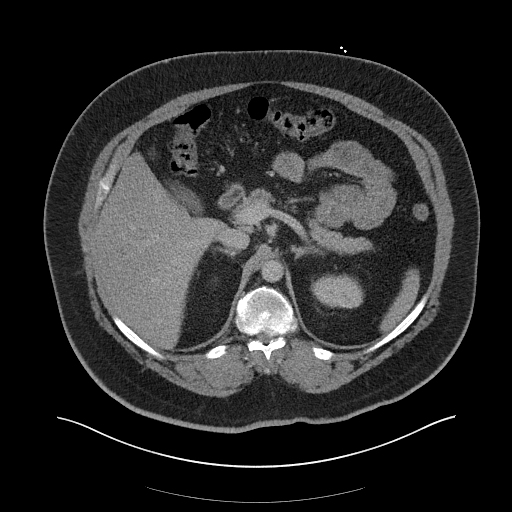
[im 89/113  soft-tissue]
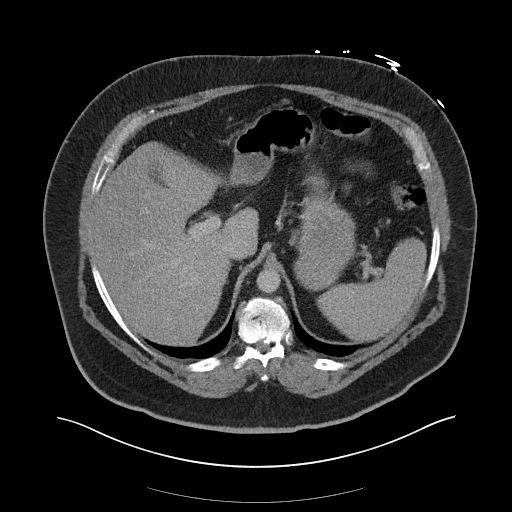
[im 101/113  soft-tissue]
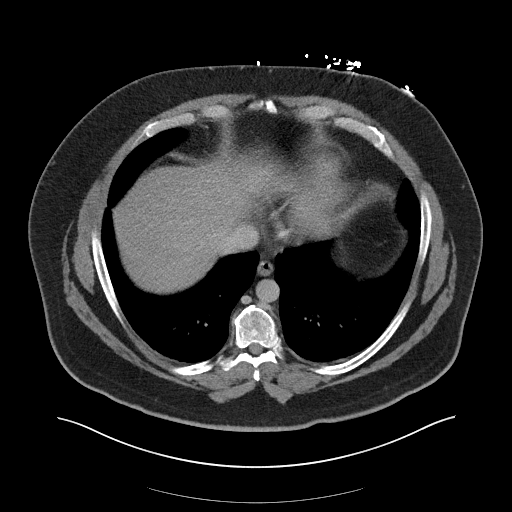
[im 107/113  soft-tissue]
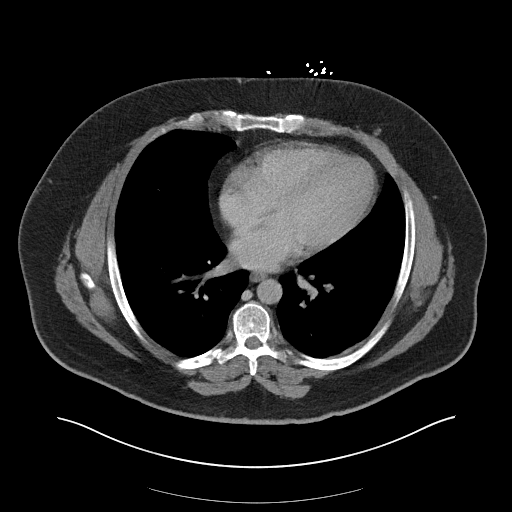

[Series 6: coronal soft tissue · coronal · 1.10mm/px · 3 of 141 slices shown]
[im 47/141  soft-tissue]
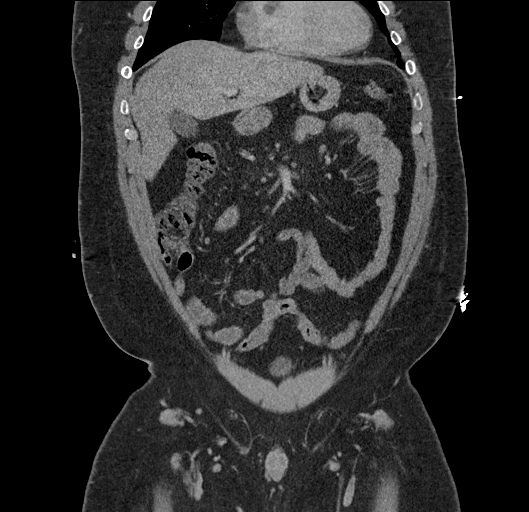
[im 63/141  soft-tissue]
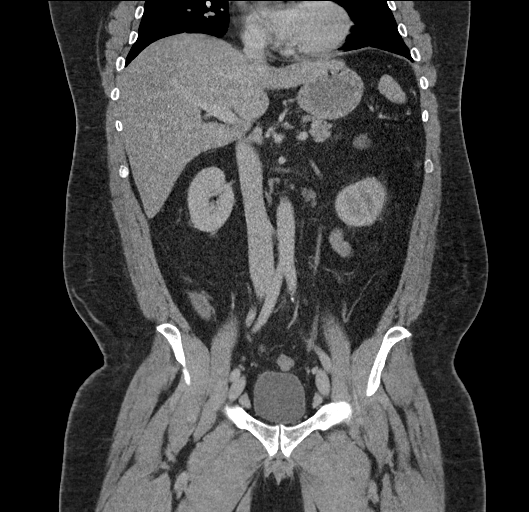
[im 78/141  soft-tissue]
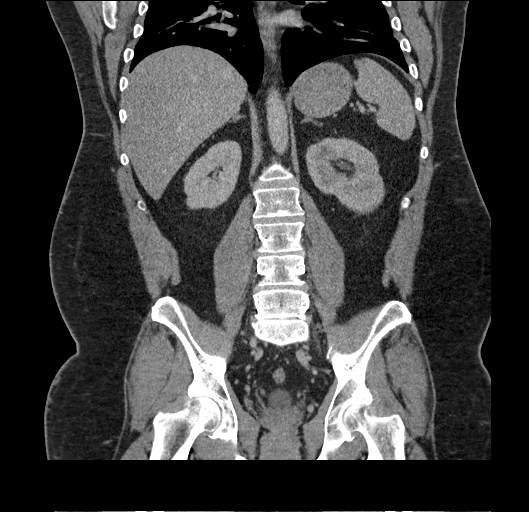

[17 of 46 positions shown; findings below may reference images not displayed]

FINDINGS: Lower chest: Stable bilateral 2-3 mm subpleural pulmonary nodules.

Hepatobiliary: No focal liver abnormality is seen. No gallstones,
gallbladder wall thickening, or biliary dilatation.

Pancreas: Unremarkable. No pancreatic ductal dilatation or
surrounding inflammatory changes.

Spleen: Normal in size without focal abnormality.

Adrenals/Urinary Tract: Adrenal glands are unremarkable. There is a
too small to characterize hypodense lesion in the superior pole the
left kidney. There is a 5 mm calculus in the very proximal left
ureter with a small amount of upstream hydronephrosis. No right
renal calculi. Bladder is unremarkable.

Stomach/Bowel: Stomach is within normal limits. Appendix appears
normal. No evidence of bowel wall thickening, distention, or
inflammatory changes.

Vascular/Lymphatic: Minimal atherosclerotic calcification in the
common iliac arteries. No enlarged abdominal or pelvic lymph nodes.

Reproductive: Prostate is unremarkable.

Other: No abdominal wall hernia or abnormality. No abdominopelvic
ascites.

Musculoskeletal: Midthoracic and lower lumbar degenerative changes.
IMPRESSION: 1. 5 mm calculus in the very proximal LEFT ureter with a small
amount of upstream hydronephrosis.

2.  No other acute intra-abdominal or intrapelvic process.

## 2021-06-25 ENCOUNTER — Other Ambulatory Visit: Payer: Self-pay | Admitting: Physical Medicine and Rehabilitation

## 2021-07-08 ENCOUNTER — Ambulatory Visit (HOSPITAL_BASED_OUTPATIENT_CLINIC_OR_DEPARTMENT_OTHER): Payer: BC Managed Care – PPO | Admitting: Internal Medicine

## 2021-07-09 DIAGNOSIS — U071 COVID-19: Secondary | ICD-10-CM | POA: Diagnosis not present

## 2021-07-09 DIAGNOSIS — G4734 Idiopathic sleep related nonobstructive alveolar hypoventilation: Secondary | ICD-10-CM | POA: Diagnosis not present

## 2021-07-15 ENCOUNTER — Encounter (HOSPITAL_BASED_OUTPATIENT_CLINIC_OR_DEPARTMENT_OTHER): Payer: BC Managed Care – PPO | Attending: Internal Medicine | Admitting: Internal Medicine

## 2021-07-15 ENCOUNTER — Other Ambulatory Visit: Payer: Self-pay

## 2021-07-15 DIAGNOSIS — L97522 Non-pressure chronic ulcer of other 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: Secondary | ICD-10-CM | POA: Diagnosis not present

## 2021-07-15 DIAGNOSIS — L89613 Pressure ulcer of right heel, stage 3: Secondary | ICD-10-CM | POA: Diagnosis not present

## 2021-07-15 DIAGNOSIS — L89623 Pressure ulcer of left heel, stage 3: Secondary | ICD-10-CM | POA: Diagnosis not present

## 2021-07-15 DIAGNOSIS — L97512 Non-pressure chronic ulcer of other part of right foot with fat layer exposed: Secondary | ICD-10-CM | POA: Diagnosis not present

## 2021-07-15 NOTE — Progress Notes (Signed)
[name] ([id number]) Visit Report for 07/15/2021 Debridement Details Patient Name: Date of Service: [name], [place] 07/15/2021 9:45 A M Medical Record Number: [id number] Patient Account Number: [id number] Date of Birth/Sex: Treating RN: 06/24/1976 (45 y.o. [name], [name] Primary Care Provider: [name] Other Clinician: Referring Provider: Treating Provider/Extender: [name] in Treatment: 52 Debridement Performed for Assessment: Wound #1 Right Calcaneus Performed By: Physician [name], MD Debridement Type: Debridement Level of Consciousness (Pre-procedure): Awake and Alert Pre-procedure Verification/Time Out Yes - 10:05 Taken: Start Time: 10:06 Pain Control: Lidocaine 4% T opical Solution T Area Debrided (L x W): otal 1.5 (cm) x 0.8 (cm) = 1.2 (cm) Tissue and other material debrided: Viable, Non-Viable, Callus, Subcutaneous, Skin: Epidermis Level: Skin/Subcutaneous Tissue Debridement Description: Excisional Instrument: Curette Bleeding: Minimum Hemostasis Achieved: Pressure Procedural Pain: 3 Post Procedural Pain: 2 Response to Treatment: Procedure was tolerated well Level of Consciousness (Post- Awake and Alert procedure): Post Debridement Measurements of Total Wound Length: (cm) 0.9 Stage: Category/Stage III Width: (cm) 0.3 Depth: (cm) 0.2 Volume: (cm) 0.042 Character of Wound/Ulcer Post Debridement: Improved Post Procedure Diagnosis Same as Pre-procedure Electronic Signature(s) Signed: 07/15/2021 3:55:54 PM By: [name] RN, BSN Signed: 07/15/2021 4:05:05 PM By: Linton Ham MD Entered By: Linton Ham on 07/15/2021 10:36:24 -------------------------------------------------------------------------------- Debridement Details Patient Name: Date of Service: [name], [name] 07/15/2021 9:45 A M Medical Record Number: [id number] Patient Account Number: [id number] Date of Birth/Sex: Treating RN: 1976-03-01 (45  y.o. [name], [name] Primary Care Provider: [name] Other Clinician: Referring Provider: Treating Provider/Extender: [name] in Treatment: 52 Debridement Performed for Assessment: Wound #2 Left Calcaneus Performed By: Physician [name], MD Debridement Type: Debridement Level of Consciousness (Pre-procedure): Awake and Alert Pre-procedure Verification/Time Out Yes - 10:05 Taken: Start Time: 10:06 Pain Control: Lidocaine 4% T opical Solution T Area Debrided (L x W): otal 2.5 (cm) x 1 (cm) = 2.5 (cm) Tissue and other material debrided: Viable, Non-Viable, Callus, Subcutaneous, Skin: Epidermis Level: Skin/Subcutaneous Tissue Debridement Description: Excisional Instrument: Curette Bleeding: Minimum Hemostasis Achieved: Pressure Procedural Pain: 3 Post Procedural Pain: 2 Response to Treatment: Procedure was tolerated well Level of Consciousness (Post- Awake and Alert procedure): Post Debridement Measurements of Total Wound Length: (cm) 2.5 Stage: Category/Stage III Width: (cm) 0.6 Depth: (cm) 0.4 Volume: (cm) 0.471 Character of Wound/Ulcer Post Debridement: Improved Post Procedure Diagnosis Same as Pre-procedure Electronic Signature(s) Signed: 07/15/2021 3:55:54 PM By: [name] RN, BSN Signed: 07/15/2021 4:05:05 PM By: Linton Ham MD Entered By: Linton Ham on 07/15/2021 10:36:33 -------------------------------------------------------------------------------- HPI Details Patient Name: Date of Service: [name], [name] 07/15/2021 9:45 A M Medical Record Number: [id number] Patient Account Number: [id number] Date of Birth/Sex: Treating RN: 1975-11-10 (45 y.o. [name] Primary Care Provider: [name] Other Clinician: Referring Provider: Treating Provider/Extender: [name] in Treatment: 53 History of Present Illness HPI Description: Wounds are12/03/2020 upon evaluation today patient  presents for initial inspection here in our clinic concerning issues he has been having with the bottoms of his feet bilaterally. He states these actually occurred as wounds when he was hospitalized for 5 months secondary to Covid. He was apparently with tilting bed where he was in an upright position quite frequently and apparently this occurred in some way shape or form during that time. Fortunately there is no sign of active infection at this time. No fevers, chills, nausea, vomiting, or diarrhea. With that being said he still  has substantial wounds on the plantar aspects of his feet Theragen require quite a bit of work to get these to heal. He has been using Santyl currently though that is been problematic both in receiving the medication as well as actually paid for it as it is become quite expensive. Prior to the experience with Covid the patient really did not have any major medical problems other than hypertension he does have some mild generalized weakness following the Covid experience. 07/22/2020 on evaluation today patient appears to be doing okay in regard to his foot ulcers I feel like the wound beds are showing signs of better improvement that I do believe the Iodoflex is helping in this regard. With that being said he does have a lot of drainage currently and this is somewhat blue/green in nature which is consistent with Pseudomonas. I do think a culture today would be appropriate for [place] to evaluate and see if that is indeed the case I would likely start him on antibiotic orally as well he is not allergic to Cipro knows of no issues he has had in the past 12/21; patient was admitted to the clinic earlier this month with bilateral presumed pressure ulcers on the bottom of his feet apparently related to excessive pressure from a tilt table arrangement in the intensive care unit. Patient relates this to being on ECMO but I am not really sure that is exactly related to that. I must say I  have never seen anything like this. He has fairly extensive full-thickness wounds extending from his heel towards his midfoot mostly centered laterally. There is already been some healing distally. He does not appear to have an arterial issue. He has been using gentamicin to the wound surfaces with Iodoflex to help with ongoing debridement 1/6; this is a patient with pressure ulcers on the bottom of his feet related to excessive pressure from a standing position in the intensive care unit. He is complaining of a lot of pain in the right heel. He is not a diabetic. 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 We have been using Iodoflex to help prepare the surfaces of both wounds for an advanced treatment product. He is nonambulatory spending most of his time in a wheelchair I have asked him not to propel the wheelchair with his heels 1/13; in general his wounds look better not much surface area change we have been using Iodoflex as of last week. I did an x-ray of the right heel as the patient was complaining of pain. I had some thoughts about a stress fracture perhaps Achilles tendon problems however what it showed was erosive changes along the inferior aspect of the calcaneus he now has a MRI booked for 1/20. 1/20; in general his wounds continue to be better. Some improvement in the large narrow areas proximally in his foot. He is still complaining of pain in the right heel and tenderness in certain areas of this wound. His MRI is tonight. I am not just looking for osteomyelitis that was brought up on the x-ray I am wondering about stress fractures, tendon ruptures etc. He has no such findings on the left. Also noteworthy is that the patient had critical illness neuropathy and some of the discomfort may be actual improvement in nerve function I am just not sure. These wounds were initially in the setting of severe critical illness related to COVID-19. He was put in a standing  position. He may have also been on pressors at  the point contributing to tissue ischemia. By his description at some point these wounds were grossly necrotic extending proximally up into the Achilles part of his heel. I do not know that I have ever really seen pictures of them like this although they may exist in epic We have ordered Tri layer Oasis. I am trying to stimulate some granulation in these areas. This is of course assuming the MRI is negative for infection 1/27; since the patient was last here he saw Dr. Juleen China of infectious disease. He is planned for vancomycin and ceftriaxone. Prior operative culture grew MSSA. Also ordered baseline lab work. He also ordered arterial studies although the ABIs in our clinic were normal as well as his clinical exam these were normal I do not think he needs to see vascular surgery. His ABIs at the PTA were 1.22 in the right triphasic waveforms with a normal TBI of 1.15 on the left ABI of 1.22 with triphasic waveforms and a normal TBI of 1.08. Finally he saw Dr. Amalia Hailey who will follow him in for 2 months. At this point I do not think he felt that he needed a procedure on the right calcaneal bone. Dr. Juleen China is elected for broad-spectrum antibiotic The patient is still having pain in the right heel. He walks with a walker 2/3; wounds are generally smaller. He is tolerating his IV antibiotics. I believe this is vancomycin and ceftriaxone. We are still waiting for Oasis burn in terms of his out-of-pocket max which he should be meeting soon given the IV antibiotics, MRIs etc. I have asked him to check in on this. We are using silver collagen in the meantime the wounds look better 2/10; tolerating IV vancomycin and Rocephin. We are waiting to apply for Oasis. Although I am not really sure where he is in his out-of-pocket max. 2/17 started the first application of Oasis trilayer. Still on antibiotics. The wounds have generally look better. The area on the left  has a little more surface slough requiring debridement 4/53; second application of Oasis trilayer. The wound surface granulation is generally look better. The area on the left with undermining laterally I think is come in a bit. 10/08/2020 upon evaluation today patient is here today for [employer] application #3. Fortunately he seems to be doing extremely well with regard to this and we are seeing a lot of new epithelial growth which is great news. Fortunately there is no signs of active infection at this time. 10/16/2020 upon evaluation today patient appears to be doing well with regard to his foot ulcers. Do believe the Oasis has been of benefit for him. I do not see any signs of infection right now which is great news and I think that he has a lot of new epithelial growth which is great to see as well. The patient is very pleased to hear all of this. I do think we can proceed with the Oasis trilayer #4 today. 3/18; not as much improvement in these areas on his heels that I was hoping. I did reapply trilateral Oasis today the tissue looks healthier but not as much fill in as I was hoping. 3/25; better looking today I think this is come in a bit the tissue looks healthier. Triple layer Oasis reapplied #6 4/1; somewhat better looking definitely better looking surface not as much change in surface area as I was hoping. He may be spending more time [place] on days then he needs to although he does have heel offloading  boots. Triple layer Oasis reapplied #7 4/7; unfortunately apparently [employer] will not approve any further Oasis which is unfortunate since the patient did respond nicely both in terms of the condition of the wound bed as well as surface area. There is still some drainage coming from the wound but not a lot there does not appear to be any infection 4/15; we have been using Hydrofera Blue. He continues to have nice rims of epithelialization on the right greater than the left.  The left the epithelialization is coming from the tip of his heel. There is moderate drainage. In this that concerns me about a total contact cast. There is no evidence of infection 4/29; patient has been using Hydrofera Blue with dressing changes. He has no complaints or issues today. 5/5; using Hydrofera Blue. I actually think that he looks marginally better than the last time I saw this 3 weeks ago. There are rims of epithelialization on the left thumb coming from the medial side on the right. Using Hydrofera Blue 5/12; using Hydrofera Blue. These continue to make improvements in surface area. His drainage was not listed as severe I therefore went ahead and put a cast on the left foot. Right foot we will continue to dress his previous 5/16; back for first total contact cast change. He did not tolerate this particularly well cast injury on the anterior tibia among other issues. Difficulty sleeping. I talked him about this in some detail and afterwards is elected to continue. I told him I would like to have a cast on for 3 weeks to see if this is going to help at all. I think he agreed 5/19; I think the wound is better. There is no tunneling towards his midfoot. The undermining medially also looks better. He has a rim of new skin distally. I think we are making progress here. The area on the left also continues to look somewhat better to me using Hydrofera Blue. He has a list of complaints about the cast but none of them seem serious 5/26; patient presents for 1 week follow-up. He has been using a total contact cast and tolerating this well. Hydrofera Blue is the main dressing used. He denies signs of infection. 6/2 Hydrofera Blue total contact cast on the left. These were large ulcers that formed in intensive care unit where the patient was recovering from [place]. May have had something to do with being ventilated in an upright positiono Pressors etc. We have been able to get the areas down  considerably and a viable surface. There is some epithelialization in both sides. Note made of drainage 6/9; changed to [name] last time because of drainage. He arrives with better looking surfaces and dimensions on the left than the right. Paradoxically the right actually probes more towards his midfoot the left is largely close down but both of these look improved. Using a total contact cast on the left 6/16; complex wounds on his bilateral plantar heels which were initially pressure injury from a stay in the ICU with COVID. We have been using silver alginate most recently. His dimensions of come in quite dramatically however not recently. We have been putting the left foot in a total contact cast 6/23; complex wounds on the bilateral plantar heels. I been putting the left in the cast paradoxically the area on the right is the one that is going towards closure at a faster rate. Quite a bit of drainage on the left. The patient went to  see Dr. Amalia Hailey who said he was going to standby for skin grafts. I had actually considered sending him for skin grafts however he would be mandatorily off his feet for a period of weeks to months. I am thinking that the area on the right is going to close on its own the area on the left has been more stubborn even though we have him in a total contact cast 6/30; took him out of a total contact cast last week is the right heel seem to be making better progress than the left where I was placing the cast. We are using silver alginate. Both wounds are smaller right greater than left 7/12; both wounds look as though they are making some progress. We are using silver alginate. Heel offloading boots 7/26; very gradual progress especially on the right. Using silver alginate. He is wearing heel offloading boots 8/18; he continues to close these wounds down very gradually. Using silver alginate. The problem polymen being definitive about this is areas of what appears to  be callus around the margins. This is not a 100% of the area but certainly sizable especially on the right 9/1; bilateral plantar feet wounds secondary to prolonged pressure while being ventilated for COVID-19 in an upright position. Essentially pressure ulcers on the bottom of his feet. He is made substantial progress using silver alginate. 9/14; bilateral plantar feet wounds secondary to prolonged pressure. Making progress using silver alginate. 9/29 bilateral plantar feet wounds secondary to prolonged pressure. I changed him to Iodoflex last week. MolecuLight showing reddened blush fluorescence 10/11; patient presents for follow-up. He has no issues or complaints today. He denies signs of infection. He continues to use Iodoflex and antibiotic ointment to the wound beds. 10/27; 2-week follow-up. No evidence of infection. He has callus and thick dry skin around the wound margins we have been using Iodoflex and Bactroban which was in response to a moderate left MolecuLight reddish blush fluorescence. 11/10; 2-week follow-up. Wound margins again have thick callus however the measurements of the actual wound sites are a lot smaller. Everything looks reasonably healthy here. We have been using Iodoflex He was approved for prime matrix but I have elected to delay this given the improvement in the surface area. Hopefully I will not regret that decision as were getting close to the end of the year in terms of insurance payment 12/8; 2-week follow-up. Wounds are generally smaller in size. These were initially substantial wounds extending into the forefoot all the way into the heel on the bilateral plantar feet. They are now both located on the plantar heel distal aspect both of these have a lot of callus around the wounds I used a #5 curette to remove this on the right and the left also some subcutaneous debris to try and get the wound edges were using Iodoflex. He has heel offloading shoes Electronic  Signature(s) Signed: 07/15/2021 4:05:05 PM By: Linton Ham MD Entered By: Linton Ham on 07/15/2021 10:37:59 -------------------------------------------------------------------------------- Physical Exam Details Patient Name: Date of Service: [name], [place] 07/15/2021 9:45 A M Medical Record Number: [id number] Patient Account Number: [id number] Date of Birth/Sex: Treating RN: [birth year] (45 y.o. [name] Primary Care Provider: [name] Other Clinician: Referring Provider: Treating Provider/Extender: [name] in Treatment: 52 Constitutional Sitting or standing Blood Pressure is within target range for patient.. Pulse regular and within target range for patient.Marland Kitchen Respirations regular, non-labored and within target range.. Temperature is normal and within the target range  for the patient.Marland Kitchen Appears in no distress. Notes Wound exam; bilateral plantar heel wounds extending towards the midfoot both required debridement although both are smaller and gradually filling in. There is no evidence of infection Electronic Signature(s) Signed: 07/15/2021 4:05:05 PM By: Linton Ham MD Entered By: Linton Ham on 07/15/2021 10:38:38 -------------------------------------------------------------------------------- Physician Orders Details Patient Name: Date of Service: [name], [place] 07/15/2021 9:45 A M Medical Record Number: [id number] Patient Account Number: [id number] Date of Birth/Sex: Treating RN: 1976/02/17 (45 y.o. [name] Primary Care Provider: [name] Other Clinician: Referring Provider: Treating Provider/Extender: [name] in Treatment: 58 Verbal / Phone Orders: No Diagnosis Coding ICD-10 Coding Code Description [phone] Non-pressure chronic ulcer of other part of right foot with fat layer exposed L97.522 Non-pressure chronic ulcer of other 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 Follow-up  Appointments ppointment in 2 weeks. - Dr. Dellia Nims Return A Bathing/ Shower/ Hygiene May shower with protection but do not get wound dressing(s) wet. Edema Control - Lymphedema / SCD / Other Bilateral Lower Extremities Elevate legs to the level of the heart or above for 30 minutes daily and/or when sitting, a frequency of: - throughout the day Avoid standing for long periods of time. Moisturize legs daily. - right leg and foot every night. Off-Loading Wedge shoe to: - Glophed Shoe to both feet. Other: - keep pressure off of the bottom of your feet Additional Orders / Instructions Follow Nutritious Diet Wound Treatment Wound #1 - Calcaneus Wound Laterality: Right Cleanser: Normal Saline (Generic) Every Other Day/30 Days Discharge Instructions: Cleanse the wound with Normal Saline prior to applying a clean dressing using gauze sponges, not tissue or cotton balls. Cleanser: Wound Cleanser Every Other Day/30 Days Discharge Instructions: Cleanse the wound with wound cleanser prior to applying a clean dressing using gauze sponges, not tissue or cotton balls. Prim Dressing: Iodosorb Gel 10 (gm) Tube (DME) (Generic) Every Other Day/30 Days ary Discharge Instructions: Apply to wound bed as instructed Secondary Dressing: Woven Gauze Sponge, Non-Sterile 4x4 in (DME) (Generic) Every Other Day/30 Days Discharge Instructions: Apply over primary dressing as directed. Secondary Dressing: Zetuvit Plus 4x8 in (DME) (Generic) Every Other Day/30 Days Discharge Instructions: Apply over primary dressing as directed. Secured With: Elastic Bandage 4 inch (ACE bandage) (DME) (Generic) Every Other Day/30 Days Discharge Instructions: Secure with ACE bandage as directed. Secured With: [employer], 4.5x3.1 (in/yd) (DME) (Generic) Every Other Day/30 Days Discharge Instructions: Secure with Kerlix as directed. Wound #2 - Calcaneus Wound Laterality: Left Cleanser: Normal Saline (Generic) Every Other Day/30  Days Discharge Instructions: Cleanse the wound with Normal Saline prior to applying a clean dressing using gauze sponges, not tissue or cotton balls. Cleanser: Wound Cleanser Every Other Day/30 Days Discharge Instructions: Cleanse the wound with wound cleanser prior to applying a clean dressing using gauze sponges, not tissue or cotton balls. Prim Dressing: Iodosorb Gel 10 (gm) Tube (DME) (Generic) Every Other Day/30 Days ary Discharge Instructions: Apply to wound bed as instructed Secondary Dressing: Woven Gauze Sponge, Non-Sterile 4x4 in (DME) (Generic) Every Other Day/30 Days Discharge Instructions: Apply over primary dressing as directed. Secondary Dressing: Zetuvit Plus 4x8 in (DME) (Generic) Every Other Day/30 Days Discharge Instructions: Apply over primary dressing as directed. Secured With: Elastic Bandage 4 inch (ACE bandage) (DME) (Generic) Every Other Day/30 Days Discharge Instructions: Secure with ACE bandage as directed. Secured With: [employer], 4.5x3.1 (in/yd) (DME) (Generic) Every Other Day/30 Days Discharge Instructions: Secure with Kerlix as directed.  Patient Medications llergies: No Known Drug Allergies A Notifications Medication Indication Start End prior to debridement 07/15/2021 lidocaine DOSE topical 4 % cream - cream topical Electronic Signature(s) Signed: 07/15/2021 4:05:05 PM By: Linton Ham MD Signed: 07/15/2021 4:26:43 PM By: Baruch Gouty RN, BSN Entered By: Baruch Gouty on 07/15/2021 10:38:28 -------------------------------------------------------------------------------- Problem List Details Patient Name: Date of Service: [name], TO [place] 07/15/2021 9:45 A M Medical Record Number: [id number] Patient Account Number: [id number] Date of Birth/Sex: Treating RN: [birth year] (45 y.o. [name] Primary Care Provider: [name] Other Clinician: Referring Provider: Treating Provider/Extender: [name] in  Treatment: 52 Active Problems ICD-10 Encounter Code Description Active Date MDM Diagnosis [phone] Non-pressure chronic ulcer of other part of right foot with fat layer exposed 09/03/2020 No Yes L97.522 Non-pressure chronic ulcer of other 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 09/03/2020 No Yes Inactive Problems ICD-10 Code Description Active Date Inactive Date L89.893 Pressure ulcer of other site, stage 3 07/15/2020 07/15/2020 M62.81 Muscle weakness (generalized) 07/15/2020 07/15/2020 I10 Essential (primary) hypertension 07/15/2020 07/15/2020 M86.171 Other acute osteomyelitis, right ankle and foot 09/03/2020 09/03/2020 Resolved Problems Electronic Signature(s) Signed: 07/15/2021 4:05:05 PM By: Linton Ham MD Entered By: Linton Ham on 07/15/2021 10:36:03 -------------------------------------------------------------------------------- Progress Note Details Patient Name: Date of Service: [name], [place] 07/15/2021 9:45 A M Medical Record Number: [id number] Patient Account Number: [id number] Date of Birth/Sex: Treating RN: Dec 30, 1975 (45 y.o. [name] Primary Care Provider: [name] Other Clinician: Referring Provider: Treating Provider/Extender: [name] in Treatment: 52 Subjective History of Present Illness (HPI) Wounds are12/03/2020 upon evaluation today patient presents for initial inspection here in our clinic concerning issues he has been having with the bottoms of his feet bilaterally. He states these actually occurred as wounds when he was hospitalized for 5 months secondary to Covid. He was apparently with tilting bed where he was in an upright position quite frequently and apparently this occurred in some way shape or form during that time. Fortunately there is no sign of active infection at this time. No fevers, chills, nausea, vomiting, or diarrhea. With that being said he still has substantial wounds on the plantar aspects of  his feet Theragen require quite a bit of work to get these to heal. He has been using Santyl currently though that is been problematic both in receiving the medication as well as actually paid for it as it is become quite expensive. Prior to the experience with Covid the patient really did not have any major medical problems other than hypertension he does have some mild generalized weakness following the Covid experience. 07/22/2020 on evaluation today patient appears to be doing okay in regard to his foot ulcers I feel like the wound beds are showing signs of better improvement that I do believe the Iodoflex is helping in this regard. With that being said he does have a lot of drainage currently and this is somewhat blue/green in nature which is consistent with Pseudomonas. I do think a culture today would be appropriate for [place] to evaluate and see if that is indeed the case I would likely start him on antibiotic orally as well he is not allergic to Cipro knows of no issues he has had in the past 12/21; patient was admitted to the clinic earlier this month with bilateral presumed pressure ulcers on the bottom of his feet apparently related to excessive pressure from a tilt table arrangement in the intensive care unit. Patient  relates this to being on ECMO but I am not really sure that is exactly related to that. I must say I have never seen anything like this. He has fairly extensive full-thickness wounds extending from his heel towards his midfoot mostly centered laterally. There is already been some healing distally. He does not appear to have an arterial issue. He has been using gentamicin to the wound surfaces with Iodoflex to help with ongoing debridement 1/6; this is a patient with pressure ulcers on the bottom of his feet related to excessive pressure from a standing position in the intensive care unit. He is complaining of a lot of pain in the right heel. He is not a diabetic. 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 We have been using Iodoflex to help prepare the surfaces of both wounds for an advanced treatment product. He is nonambulatory spending most of his time in a wheelchair I have asked him not to propel the wheelchair with his heels 1/13; in general his wounds look better not much surface area change we have been using Iodoflex as of last week. I did an x-ray of the right heel as the patient was complaining of pain. I had some thoughts about a stress fracture perhaps Achilles tendon problems however what it showed was erosive changes along the inferior aspect of the calcaneus he now has a MRI booked for 1/20. 1/20; in general his wounds continue to be better. Some improvement in the large narrow areas proximally in his foot. He is still complaining of pain in the right heel and tenderness in certain areas of this wound. His MRI is tonight. I am not just looking for osteomyelitis that was brought up on the x-ray I am wondering about stress fractures, tendon ruptures etc. He has no such findings on the left. Also noteworthy is that the patient had critical illness neuropathy and some of the discomfort may be actual improvement in nerve function I am just not sure. These wounds were initially in the setting of severe critical illness related to COVID-19. He was put in a standing position. He may have also been on pressors at the point contributing to tissue ischemia. By his description at some point these wounds were grossly necrotic extending proximally up into the Achilles part of his heel. I do not know that I have ever really seen pictures of them like this although they may exist in epic We have ordered Tri layer Oasis. I am trying to stimulate some granulation in these areas. This is of course assuming the MRI is negative for infection 1/27; since the patient was last here he saw Dr. Juleen China of infectious disease. He is planned for vancomycin and  ceftriaxone. Prior operative culture grew MSSA. Also ordered baseline lab work. He also ordered arterial studies although the ABIs in our clinic were normal as well as his clinical exam these were normal I do not think he needs to see vascular surgery. His ABIs at the [facility] were 1.22 in the right triphasic waveforms with a normal TBI of 1.15 on the left ABI of 1.22 with triphasic waveforms and a normal TBI of 1.08. Finally he saw Dr. Amalia Hailey who will follow him in for 2 months. At this point I do not think he felt that he needed a procedure on the right calcaneal bone. Dr. Juleen China is elected for broad-spectrum antibiotic The patient is still having pain in the right heel. He walks with a walker 2/3; wounds are  generally smaller. He is tolerating his IV antibiotics. I believe this is vancomycin and ceftriaxone. We are still waiting for Oasis burn in terms of his out-of-pocket max which he should be meeting soon given the IV antibiotics, MRIs etc. I have asked him to check in on this. We are using silver collagen in the meantime the wounds look better 2/10; tolerating IV vancomycin and Rocephin. We are waiting to apply for Oasis. Although I am not really sure where he is in his out-of-pocket max. 2/17 started the first application of Oasis trilayer. Still on antibiotics. The wounds have generally look better. The area on the left has a little more surface slough requiring debridement 9/47; second application of Oasis trilayer. The wound surface granulation is generally look better. The area on the left with undermining laterally I think is come in a bit. 10/08/2020 upon evaluation today patient is here today for [employer] application #3. Fortunately he seems to be doing extremely well with regard to this and we are seeing a lot of new epithelial growth which is great news. Fortunately there is no signs of active infection at this time. 10/16/2020 upon evaluation today patient appears to be doing well  with regard to his foot ulcers. Do believe the Oasis has been of benefit for him. I do not see any signs of infection right now which is great news and I think that he has a lot of new epithelial growth which is great to see as well. The patient is very pleased to hear all of this. I do think we can proceed with the Oasis trilayer #4 today. 3/18; not as much improvement in these areas on his heels that I was hoping. I did reapply trilateral Oasis today the tissue looks healthier but not as much fill in as I was hoping. 3/25; better looking today I think this is come in a bit the tissue looks healthier. Triple layer Oasis reapplied #6 4/1; somewhat better looking definitely better looking surface not as much change in surface area as I was hoping. He may be spending more time [place] on days then he needs to although he does have heel offloading boots. Triple layer Oasis reapplied #7 4/7; unfortunately apparently [facility] will not approve any further Oasis which is unfortunate since the patient did respond nicely both in terms of the condition of the wound bed as well as surface area. There is still some drainage coming from the wound but not a lot there does not appear to be any infection 4/15; we have been using Hydrofera Blue. He continues to have nice rims of epithelialization on the right greater than the left. The left the epithelialization is coming from the tip of his heel. There is moderate drainage. In this that concerns me about a total contact cast. There is no evidence of infection 4/29; patient has been using Hydrofera Blue with dressing changes. He has no complaints or issues today. 5/5; using Hydrofera Blue. I actually think that he looks marginally better than the last time I saw this 3 weeks ago. There are rims of epithelialization on the left thumb coming from the medial side on the right. Using Hydrofera Blue 5/12; using Hydrofera Blue. These continue to make  improvements in surface area. His drainage was not listed as severe I therefore went ahead and put a cast on the left foot. Right foot we will continue to dress his previous 5/16; back for first total contact cast change. He did  not tolerate this particularly well cast injury on the anterior tibia among other issues. Difficulty sleeping. I talked him about this in some detail and afterwards is elected to continue. I told him I would like to have a cast on for 3 weeks to see if this is going to help at all. I think he agreed 5/19; I think the wound is better. There is no tunneling towards his midfoot. The undermining medially also looks better. He has a rim of new skin distally. I think we are making progress here. The area on the left also continues to look somewhat better to me using Hydrofera Blue. He has a list of complaints about the cast but none of them seem serious 5/26; patient presents for 1 week follow-up. He has been using a total contact cast and tolerating this well. Hydrofera Blue is the main dressing used. He denies signs of infection. 6/2 Hydrofera Blue total contact cast on the left. These were large ulcers that formed in intensive care unit where the patient was recovering from [place]. May have had something to do with being ventilated in an upright positiono Pressors etc. We have been able to get the areas down considerably and a viable surface. There is some epithelialization in both sides. Note made of drainage 6/9; changed to [facility] last time because of drainage. He arrives with better looking surfaces and dimensions on the left than the right. Paradoxically the right actually probes more towards his midfoot the left is largely close down but both of these look improved. Using a total contact cast on the left 6/16; complex wounds on his bilateral plantar heels which were initially pressure injury from a stay in the ICU with COVID. We have been using silver alginate most  recently. His dimensions of come in quite dramatically however not recently. We have been putting the left foot in a total contact cast 6/23; complex wounds on the bilateral plantar heels. I been putting the left in the cast paradoxically the area on the right is the one that is going towards closure at a faster rate. Quite a bit of drainage on the left. The patient went to see Dr. Amalia Hailey who said he was going to standby for skin grafts. I had actually considered sending him for skin grafts however he would be mandatorily off his feet for a period of weeks to months. I am thinking that the area on the right is going to close on its own the area on the left has been more stubborn even though we have him in a total contact cast 6/30; took him out of a total contact cast last week is the right heel seem to be making better progress than the left where I was placing the cast. We are using silver alginate. Both wounds are smaller right greater than left 7/12; both wounds look as though they are making some progress. We are using silver alginate. Heel offloading boots 7/26; very gradual progress especially on the right. Using silver alginate. He is wearing heel offloading boots 8/18; he continues to close these wounds down very gradually. Using silver alginate. The problem polymen being definitive about this is areas of what appears to be callus around the margins. This is not a 100% of the area but certainly sizable especially on the right 9/1; bilateral plantar feet wounds secondary to prolonged pressure while being ventilated for COVID-19 in an upright position. Essentially pressure ulcers on the bottom of his feet. He is made substantial  progress using silver alginate. 9/14; bilateral plantar feet wounds secondary to prolonged pressure. Making progress using silver alginate. 9/29 bilateral plantar feet wounds secondary to prolonged pressure. I changed him to Iodoflex last week. MolecuLight showing  reddened blush fluorescence 10/11; patient presents for follow-up. He has no issues or complaints today. He denies signs of infection. He continues to use Iodoflex and antibiotic ointment to the wound beds. 10/27; 2-week follow-up. No evidence of infection. He has callus and thick dry skin around the wound margins we have been using Iodoflex and Bactroban which was in response to a moderate left MolecuLight reddish blush fluorescence. 11/10; 2-week follow-up. Wound margins again have thick callus however the measurements of the actual wound sites are a lot smaller. Everything looks reasonably healthy here. We have been using Iodoflex He was approved for prime matrix but I have elected to delay this given the improvement in the surface area. Hopefully I will not regret that decision as were getting close to the end of the year in terms of insurance payment 12/8; 2-week follow-up. Wounds are generally smaller in size. These were initially substantial wounds extending into the forefoot all the way into the heel on the bilateral plantar feet. They are now both located on the plantar heel distal aspect both of these have a lot of callus around the wounds I used a #5 curette to remove this on the right and the left also some subcutaneous debris to try and get the wound edges were using Iodoflex. He has heel offloading shoes Objective Constitutional Sitting or standing Blood Pressure is within target range for patient.. Pulse regular and within target range for patient.Marland Kitchen Respirations regular, non-labored and within target range.. Temperature is normal and within the target range for the patient.Marland Kitchen Appears in no distress. Vitals Time Taken: 9:33 AM, Height: 69 in, Source: Stated, Weight: 280 lbs, Source: Stated, BMI: 41.3, Temperature: 98.2 F, Pulse: 72 bpm, Respiratory Rate: 18 breaths/min, Blood Pressure: 137/85 mmHg. General Notes: Wound exam; bilateral plantar heel wounds extending towards the midfoot  both required debridement although both are smaller and gradually filling in. There is no evidence of infection Integumentary (Hair, Skin) Wound #1 status is Open. Original cause of wound was Pressure Injury. The date acquired was: 10/07/2019. The wound has been in treatment 52 weeks. The wound is located on the Right Calcaneus. The wound measures 0.9cm length x 0.3cm width x 0.2cm depth; 0.212cm^2 area and 0.042cm^3 volume. There is Fat Layer (Subcutaneous Tissue) exposed. There is no tunneling or undermining noted. There is a medium amount of serosanguineous drainage noted. The wound margin is thickened. There is large (67-100%) red, pink granulation within the wound bed. There is a small (1-33%) amount of necrotic tissue within the wound bed including Adherent Slough. Wound #2 status is Open. Original cause of wound was Pressure Injury. The date acquired was: 10/07/2019. The wound has been in treatment 52 weeks. The wound is located on the Left Calcaneus. The wound measures 2.5cm length x 0.6cm width x 0.4cm depth; 1.178cm^2 area and 0.471cm^3 volume. There is Fat Layer (Subcutaneous Tissue) exposed. There is no tunneling or undermining noted. There is a medium amount of serosanguineous drainage noted. The wound margin is thickened. There is large (67-100%) red granulation within the wound bed. There is no necrotic tissue within the wound bed. Assessment Active Problems ICD-10 Non-pressure chronic ulcer of other part of right foot with fat layer exposed Non-pressure chronic ulcer of other 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  Procedures Wound #1 Pre-procedure diagnosis of Wound #1 is a Pressure Ulcer located on the Right Calcaneus . There was a Excisional Skin/Subcutaneous Tissue Debridement with a total area of 1.2 sq cm performed by [name], MD. With the following instrument(s): Curette to remove Viable and Non-Viable tissue/material. Material removed includes Callus, Subcutaneous  Tissue, and Skin: Epidermis after achieving pain control using Lidocaine 4% Topical Solution. No specimens were taken. A time out was conducted at 10:05, prior to the start of the procedure. A Minimum amount of bleeding was controlled with Pressure. The procedure was tolerated well with a pain level of 3 throughout and a pain level of 2 following the procedure. Post Debridement Measurements: 0.9cm length x 0.3cm width x 0.2cm depth; 0.042cm^3 volume. Post debridement Stage noted as Category/Stage III. Character of Wound/Ulcer Post Debridement is improved. Post procedure Diagnosis Wound #1: Same as Pre-Procedure Wound #2 Pre-procedure diagnosis of Wound #2 is a Pressure Ulcer located on the Left Calcaneus . There was a Excisional Skin/Subcutaneous Tissue Debridement with a total area of 2.5 sq cm performed by [name], MD. With the following instrument(s): Curette to remove Viable and Non-Viable tissue/material. Material removed includes Callus, Subcutaneous Tissue, and Skin: Epidermis after achieving pain control using Lidocaine 4% T opical Solution. No specimens were taken. A time out was conducted at 10:05, prior to the start of the procedure. A Minimum amount of bleeding was controlled with Pressure. The procedure was tolerated well with a pain level of 3 throughout and a pain level of 2 following the procedure. Post Debridement Measurements: 2.5cm length x 0.6cm width x 0.4cm depth; 0.471cm^3 volume. Post debridement Stage noted as Category/Stage III. Character of Wound/Ulcer Post Debridement is improved. Post procedure Diagnosis Wound #2: Same as Pre-Procedure Plan Follow-up Appointments: Return Appointment in 2 weeks. - Dr. Dellia Nims Bathing/ Shower/ Hygiene: May shower with protection but do not get wound dressing(s) wet. Edema Control - Lymphedema / SCD / Other: Elevate legs to the level of the heart or above for 30 minutes daily and/or when sitting, a frequency of: - throughout  the day Avoid standing for long periods of time. Moisturize legs daily. - right leg and foot every night. Off-Loading: Wedge shoe to: - Glophed Shoe to both feet. Other: - keep pressure off of the bottom of your feet Additional Orders / Instructions: Follow Nutritious Diet The following medication(s) was prescribed: lidocaine topical 4 % cream cream topical for prior to debridement was prescribed at facility WOUND #1: - Calcaneus Wound Laterality: Right Cleanser: Normal Saline (Generic) Every Other Day/30 Days Discharge Instructions: Cleanse the wound with Normal Saline prior to applying a clean dressing using gauze sponges, not tissue or cotton balls. Cleanser: Wound Cleanser Every Other Day/30 Days Discharge Instructions: Cleanse the wound with wound cleanser prior to applying a clean dressing using gauze sponges, not tissue or cotton balls. Prim Dressing: Iodosorb Gel 10 (gm) Tube (DME) (Generic) Every Other Day/30 Days ary Discharge Instructions: Apply to wound bed as instructed Secondary Dressing: Woven Gauze Sponge, Non-Sterile 4x4 in (DME) (Generic) Every Other Day/30 Days Discharge Instructions: Apply over primary dressing as directed. Secondary Dressing: Zetuvit Plus 4x8 in (DME) (Generic) Every Other Day/30 Days Discharge Instructions: Apply over primary dressing as directed. Secured With: Elastic Bandage 4 inch (ACE bandage) (DME) (Generic) Every Other Day/30 Days Discharge Instructions: Secure with ACE bandage as directed. Secured With: [employer], 4.5x3.1 (in/yd) (DME) (Generic) Every Other Day/30 Days Discharge Instructions: Secure with Kerlix as directed. WOUND #2: -  Calcaneus Wound Laterality: Left Cleanser: Normal Saline (Generic) Every Other Day/30 Days Discharge Instructions: Cleanse the wound with Normal Saline prior to applying a clean dressing using gauze sponges, not tissue or cotton balls. Cleanser: Wound Cleanser Every Other Day/30 Days Discharge  Instructions: Cleanse the wound with wound cleanser prior to applying a clean dressing using gauze sponges, not tissue or cotton balls. Prim Dressing: Iodosorb Gel 10 (gm) Tube (DME) (Generic) Every Other Day/30 Days ary Discharge Instructions: Apply to wound bed as instructed Secondary Dressing: Woven Gauze Sponge, Non-Sterile 4x4 in (DME) (Generic) Every Other Day/30 Days Discharge Instructions: Apply over primary dressing as directed. Secondary Dressing: Zetuvit Plus 4x8 in (DME) (Generic) Every Other Day/30 Days Discharge Instructions: Apply over primary dressing as directed. Secured With: Elastic Bandage 4 inch (ACE bandage) (DME) (Generic) Every Other Day/30 Days Discharge Instructions: Secure with ACE bandage as directed. Secured With: [employer], 4.5x3.1 (in/yd) (DME) (Generic) Every Other Day/30 Days Discharge Instructions: Secure with Kerlix as directed. #1 #1 I have continued the Iodoflex which she is generally done remarkably well with over a prolonged period of time. I had some thoughts about silver collagen. We also had approval for prime matrix at [facility] although I am not convinced that we are stalled here yet. 2. Follow-up in 2 weeks I would like to make sure that we have clean edges here Electronic Signature(s) Signed: 07/15/2021 4:05:05 PM By: Linton Ham MD Entered By: Linton Ham on 07/15/2021 10:41:27 -------------------------------------------------------------------------------- SuperBill Details Patient Name: Date of Service: [name], [place]. 07/15/2021 Medical Record Number: [id number] Patient Account Number: [id number] Date of Birth/Sex: Treating RN: 09/23/1975 (45 y.o. [name] Primary Care Provider: [name] Other Clinician: Referring Provider: Treating Provider/Extender: [name] in Treatment: 52 Diagnosis Coding ICD-10 Codes Code Description [phone] Non-pressure chronic ulcer of other part of  right foot with fat layer exposed L97.522 Non-pressure chronic ulcer of other 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 Facility Procedures CPT4 Code: [id number] Description: [facility] - DEB SUBQ TISSUE 20 SQ CM/< ICD-10 Diagnosis Description L97.512 Non-pressure chronic ulcer of other part of right foot with fat layer exposed L97.522 Non-pressure chronic ulcer of other 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 Modifier: Quantity: 1 Physician Procedures : CPT4 Code Description Modifier [id number] - WC PHYS SUBQ TISS 20 SQ CM ICD-10 Diagnosis Description L97.512 Non-pressure chronic ulcer of other part of right foot with fat layer exposed L97.522 Non-pressure chronic ulcer of other part of left foot  with fat layer exposed Quantity: 1 Electronic Signature(s) Signed: 07/15/2021 4:05:05 PM By: Linton Ham MD Entered By: Linton Ham on 07/15/2021 10:41:49

## 2021-07-15 NOTE — Progress Notes (Signed)
[name], [name] ([id number]) Visit Report for 07/15/2021 Arrival Information Details Patient Name: Date of Service: [name], [name] 07/15/2021 9:45 A M Medical Record Number: [id number] Patient Account Number: [id number] Date of Birth/Sex: Treating RN: [birth year] (45 y.o. [name] Primary Care Lizandro Spellman: [name] Other Clinician: Referring Ernestine Rohman: Treating Ethanjames Fontenot/Extender: [name] in Treatment: 52 Visit Information History Since Last Visit Added or deleted any medications: Yes Patient Arrived: Wheel Chair Any new allergies or adverse reactions: No Arrival Time: 09:28 Had a fall or experienced change in No Accompanied By: spouse activities of daily living that may affect Transfer Assistance: None risk of falls: Patient Identification Verified: Yes Signs or symptoms of abuse/neglect since last visito No Secondary Verification Process Completed: Yes Hospitalized since last visit: No Patient Requires Transmission-Based Precautions: No Implantable device outside of the clinic excluding No Patient Has Alerts: No cellular tissue based products placed in the center since last visit: Has Dressing in Place as Prescribed: Yes Has Footwear/Offloading in Place as Prescribed: Yes Left: Other:globoped Right: Other:globoped Pain Present Now: Yes Electronic Signature(s) Signed: 07/15/2021 4:26:43 PM By: Baruch Gouty RN, BSN Entered By: Baruch Gouty on 07/15/2021 09:32:47 -------------------------------------------------------------------------------- Encounter Discharge Information Details Patient Name: Date of Service: [name], [place]. 07/15/2021 9:45 A M Medical Record Number: [id number] Patient Account Number: [id number] Date of Birth/Sex: Treating RN: [birth year] (45 y.o. [name] Primary Care [name]: [name] Other Clinician: Referring Luvia Orzechowski: Treating [name]/Extender: [name] in Treatment:  52 Encounter Discharge Information Items Post Procedure Vitals Discharge Condition: Stable Temperature (F): 98.2 Ambulatory Status: Wheelchair Pulse (bpm): 72 Discharge Destination: Home Respiratory Rate (breaths/min): 18 Transportation: Private Auto Blood Pressure (mmHg): 137/85 Accompanied By: spouse Schedule Follow-up Appointment: Yes Clinical Summary of Care: Patient Declined Electronic Signature(s) Signed: 07/15/2021 4:26:43 PM By: Baruch Gouty RN, BSN Entered By: Baruch Gouty on 07/15/2021 10:36:55 -------------------------------------------------------------------------------- Lower Extremity Assessment Details Patient Name: Date of Service: [name], [place] 07/15/2021 9:45 A M Medical Record Number: [id number] Patient Account Number: [id number] Date of Birth/Sex: Treating RN: [birth year] (45 y.o. [name] Primary Care Eura Radabaugh: [name] Other Clinician: Referring Brittian Renaldo: Treating Cregg Jutte/Extender: [name] in Treatment: 52 Edema Assessment Assessed: [name]: No] [name]: No] Edema: [Left: No] [Right: No] Calf Left: Right: Point of Measurement: 29 cm From Medial Instep 42.5 cm 44 cm Ankle Left: Right: Point of Measurement: 9 cm From Medial Instep 25 cm 29 cm Vascular Assessment Pulses: Dorsalis Pedis Palpable: [Left:Yes] [Right:Yes] Electronic Signature(s) Signed: 07/15/2021 4:26:43 PM By: Baruch Gouty RN, BSN Entered By: Baruch Gouty on 07/15/2021 09:52:12 -------------------------------------------------------------------------------- Multi Wound Chart Details Patient Name: Date of Service: [name], [name] 07/15/2021 9:45 A M Medical Record Number: [id number] Patient Account Number: [id number] Date of Birth/Sex: Treating RN: 18-Feb-1976 (45 y.o. [name] Primary Care [name]: [name] Other Clinician: Referring [name]: Treating Shantele Reller/Extender: [name] in  Treatment: 52 Vital Signs Height(in): 69 Pulse(bpm): 72 Weight(lbs): 280 Blood Pressure(mmHg): 137/85 Body Mass Index(BMI): 41 Temperature(F): 98.2 Respiratory Rate(breaths/min): 18 Photos: [1:No Photos Right Calcaneus] [2:No Photos Left Calcaneus] [N/A:N/A N/A] Wound Location: [1:Pressure Injury] [2:Pressure Injury] [N/A:N/A] Wounding Event: [1:Pressure Ulcer] [2:Pressure Ulcer] [N/A:N/A] Primary Etiology: [1:Asthma, Angina, Hypertension] [2:Asthma, Angina, Hypertension] [N/A:N/A] Comorbid History: [1:10/07/2019] [2:10/07/2019] [N/A:N/A] Date Acquired: [1:52] [2:52] [N/A:N/A] Weeks of Treatment: [1:Open] [2:Open] [N/A:N/A] Wound Status: [1:0.9x0.3x0.2] [2:2.5x0.6x0.4] [N/A:N/A] Measurements L x W x D (cm) [1:0.212] [2:1.178] [N/A:N/A] A (cm) : rea [1:0.042] [2:0.471] [  N/A:N/A] Volume (cm) : [1:99.30%] [2:95.70%] [N/A:N/A] % Reduction in A rea: [1:99.90%] [2:98.30%] [N/A:N/A] % Reduction in Volume: [1:Category/Stage III] [2:Category/Stage III] [N/A:N/A] Classification: [1:Medium] [2:Medium] [N/A:N/A] Exudate A mount: [1:Serosanguineous] [2:Serosanguineous] [N/A:N/A] Exudate Type: [1:red, brown] [2:red, brown] [N/A:N/A] Exudate Color: [1:Thickened] [2:Thickened] [N/A:N/A] Wound Margin: [1:Large (67-100%)] [2:Large (67-100%)] [N/A:N/A] Granulation A mount: [1:Red, Pink] [2:Red] [N/A:N/A] Granulation Quality: [1:Small (1-33%)] [2:None Present (0%)] [N/A:N/A] Necrotic A mount: [1:Fat Layer (Subcutaneous Tissue): Yes Fat Layer (Subcutaneous Tissue): Yes N/A] Exposed Structures: [1:Fascia: No Tendon: No Muscle: No Joint: No Bone: No Small (1-33%)] [2:Fascia: No Tendon: No Muscle: No Joint: No Bone: No Small (1-33%)] [N/A:N/A] Epithelialization: [1:Debridement - Excisional] [2:Debridement - Excisional] [N/A:N/A] Debridement: Pre-procedure Verification/Time Out 10:05 [2:10:05] [N/A:N/A] Taken: [1:Lidocaine 4% Topical Solution] [2:Lidocaine 4% Topical Solution] [N/A:N/A] Pain Control:  [1:Callus, Subcutaneous] [2:Callus, Subcutaneous] [N/A:N/A] Tissue Debrided: [1:Skin/Subcutaneous Tissue] [2:Skin/Subcutaneous Tissue] [N/A:N/A] Level: [1:1.2] [2:2.5] [N/A:N/A] Debridement A (sq cm): [1:rea Curette] [2:Curette] [N/A:N/A] Instrument: [1:Minimum] [2:Minimum] [N/A:N/A] Bleeding: [1:Pressure] [2:Pressure] [N/A:N/A] Hemostasis A chieved: [1:3] [2:3] [N/A:N/A] Procedural Pain: [1:2] [2:2] [N/A:N/A] Post Procedural Pain: [1:Procedure was tolerated well] [2:Procedure was tolerated well] [N/A:N/A] Debridement Treatment Response: [1:0.9x0.3x0.2] [2:2.5x0.6x0.4] [N/A:N/A] Post Debridement Measurements L x W x D (cm) [1:0.042] [2:0.471] [N/A:N/A] Post Debridement Volume: (cm) [1:Category/Stage III] [2:Category/Stage III] [N/A:N/A] Post Debridement Stage: [1:Debridement] [2:Debridement] [N/A:N/A] Treatment Notes Wound #1 (Calcaneus) Wound Laterality: Right Cleanser Normal Saline Discharge Instruction: Cleanse the wound with Normal Saline prior to applying a clean dressing using gauze sponges, not tissue or cotton balls. Wound Cleanser Discharge Instruction: Cleanse the wound with wound cleanser prior to applying a clean dressing using gauze sponges, not tissue or cotton balls. Peri-Wound Care Topical Primary Dressing Iodosorb Gel 10 (gm) Tube Discharge Instruction: Apply to wound bed as instructed Secondary Dressing Woven Gauze Sponge, Non-Sterile 4x4 in Discharge Instruction: Apply over primary dressing as directed. Zetuvit Plus 4x8 in Discharge Instruction: Apply over primary dressing as directed. Secured With Elastic Bandage 4 inch (ACE bandage) Discharge Instruction: Secure with ACE bandage as directed. Kerlix Roll Sterile, 4.5x3.1 (in/yd) Discharge Instruction: Secure with Kerlix as directed. Compression Wrap Compression Stockings Add-Ons Wound #2 (Calcaneus) Wound Laterality: Left Cleanser Normal Saline Discharge Instruction: Cleanse the wound with Normal Saline  prior to applying a clean dressing using gauze sponges, not tissue or cotton balls. Wound Cleanser Discharge Instruction: Cleanse the wound with wound cleanser prior to applying a clean dressing using gauze sponges, not tissue or cotton balls. Peri-Wound Care Topical Primary Dressing Iodosorb Gel 10 (gm) Tube Discharge Instruction: Apply to wound bed as instructed Secondary Dressing Woven Gauze Sponge, Non-Sterile 4x4 in Discharge Instruction: Apply over primary dressing as directed. Zetuvit Plus 4x8 in Discharge Instruction: Apply over primary dressing as directed. Secured With Elastic Bandage 4 inch (ACE bandage) Discharge Instruction: Secure with ACE bandage as directed. Kerlix Roll Sterile, 4.5x3.1 (in/yd) Discharge Instruction: Secure with Kerlix as directed. Compression Wrap Compression Stockings Add-Ons Electronic Signature(s) Signed: 07/15/2021 4:05:05 PM By: Linton Ham MD Signed: 07/15/2021 4:26:43 PM By: Baruch Gouty RN, BSN Entered By: Linton Ham on 07/15/2021 10:36:13 -------------------------------------------------------------------------------- Multi-Disciplinary Care Plan Details Patient Name: Date of Service: [name], [place] 07/15/2021 9:45 A M Medical Record Number: [id number] Patient Account Number: [id number] Date of Birth/Sex: Treating RN: [birth year] (45 y.o. [name] Primary Care [employer]: [name] Other Clinician: Referring Tahlia Deamer: Treating [name] [name]/Extender: [name] in Treatment: 56 Multidisciplinary Care Plan reviewed with physician Active Inactive Abuse / Safety / Falls / Self Care Management Nursing Diagnoses: Potential for  falls Goals: Patient/caregiver will verbalize/demonstrate measures taken to prevent injury and/or falls Date Initiated: 07/15/2020 Target Resolution Date: 08/12/2021 Goal Status: Active Interventions: Assess fall risk on admission and as needed Assess impairment of  mobility on admission and as needed per policy Notes: Wound/Skin Impairment Nursing Diagnoses: Impaired tissue integrity Knowledge deficit related to ulceration/compromised skin integrity Goals: Patient/caregiver will verbalize understanding of skin care regimen Date Initiated: 07/15/2020 Target Resolution Date: 08/12/2021 Goal Status: Active Ulcer/skin breakdown will have a volume reduction of 30% by week 4 Date Initiated: 07/15/2020 Date Inactivated: 08/20/2020 Target Resolution Date: 09/03/2020 Goal Status: Unmet Unmet Reason: no major changes. Ulcer/skin breakdown will heal within 14 weeks Date Initiated: 12/04/2020 Date Inactivated: 12/10/2020 Target Resolution Date: 12/10/2020 Unmet Reason: wounds still open at 14 Goal Status: Unmet weeks and today 21 weeks. Interventions: Assess patient/caregiver ability to obtain necessary supplies Assess patient/caregiver ability to perform ulcer/skin care regimen upon admission and as needed Assess ulceration(s) every visit Provide education on ulcer and skin care Treatment Activities: Skin care regimen initiated : 07/15/2020 Topical wound management initiated : 07/15/2020 Notes: Electronic Signature(s) Signed: 07/15/2021 4:26:43 PM By: Baruch Gouty RN, BSN Entered By: Baruch Gouty on 07/15/2021 09:54:11 -------------------------------------------------------------------------------- Pain Assessment Details Patient Name: Date of Service: [name], [place] 07/15/2021 9:45 A M Medical Record Number: [id number] Patient Account Number: [id number] Date of Birth/Sex: Treating RN: [birth year] (44 y.o. [name] Primary Care Jaxsyn Azam: [name] Other Clinician: Referring Jaquana Geiger: Treating Hendricks Schwandt/Extender: [name] in Treatment: 52 Active Problems Location of Pain Severity and Description of Pain Patient Has Paino Yes Site Locations Pain Location: Pain in Ulcers With Dressing Change: Yes Duration  of the Pain. Constant / Intermittento Intermittent Rate the pain. Current Pain Level: 3 Worst Pain Level: 5 Least Pain Level: 0 Character of Pain Describe the Pain: Aching, Dull, Tender Pain Management and Medication Current Pain Management: Medication: Yes Is the Current Pain Management Adequate: Adequate How does your wound impact your activities of daily livingo Sleep: No Bathing: No Appetite: No Relationship With Others: No Bladder Continence: No Emotions: No Bowel Continence: No Drive: No Toileting: No Hobbies: No Dressing: No Electronic Signature(s) Signed: 07/15/2021 4:26:43 PM By: Baruch Gouty RN, BSN Entered By: Baruch Gouty on 07/15/2021 09:34:16 -------------------------------------------------------------------------------- Patient/Caregiver Education Details Patient Name: Date of Service: [name], [place] 12/8/2022andnbsp9:45 A M Medical Record Number: [id number] Patient Account Number: [id number] Date of Birth/Gender: Treating RN: 1976/01/09 (45 y.o. [name] Primary Care Physician: [name] Other Clinician: Referring Physician: Treating Physician/Extender: [name] in Treatment: 25 Education Assessment Education Provided To: Patient Education Topics Provided Pressure: Methods: Explain/Verbal Responses: Reinforcements needed, State content correctly Wound/Skin Impairment: Methods: Explain/Verbal Responses: Reinforcements needed, State content correctly Electronic Signature(s) Signed: 07/15/2021 4:26:43 PM By: Baruch Gouty RN, BSN Entered By: Baruch Gouty on 07/15/2021 09:54:32 -------------------------------------------------------------------------------- Wound Assessment Details Patient Name: Date of Service: [name], [place] 07/15/2021 9:45 A M Medical Record Number: [id number] Patient Account Number: [id number] Date of Birth/Sex: Treating RN: 12/09/1975 (45 y.o. [name] Primary  Care Kani Jobson: [name] Other Clinician: Referring Lashawn Bromwell: Treating Sava Proby/Extender: [name] in Treatment: 47 Wound Status Wound Number: 1 Primary Etiology: Pressure Ulcer Wound Location: Right Calcaneus Wound Status: Open Wounding Event: Pressure Injury Comorbid History: Asthma, Angina, Hypertension Date Acquired: 10/07/2019 Weeks Of Treatment: 52 Clustered Wound: No Photos Photo Uploaded By: [name] on 07/15/2021 11:45:02 Wound Measurements Length: (cm) 0.9 Width: (cm) 0.3 Depth: (cm)  0.2 Area: (cm) 0.212 Volume: (cm) 0.042 % Reduction in Area: 99.3% % Reduction in Volume: 99.9% Epithelialization: Small (1-33%) Tunneling: No Undermining: No Wound Description Classification: Category/Stage III Wound Margin: Thickened Exudate Amount: Medium Exudate Type: Serosanguineous Exudate Color: red, brown Foul Odor After Cleansing: No Slough/Fibrino No Wound Bed Granulation Amount: Large (67-100%) Exposed Structure Granulation Quality: Red, Pink Fascia Exposed: No Necrotic Amount: Small (1-33%) Fat Layer (Subcutaneous Tissue) Exposed: Yes Necrotic Quality: Adherent Slough Tendon Exposed: No Muscle Exposed: No Joint Exposed: No Bone Exposed: No Treatment Notes Wound #1 (Calcaneus) Wound Laterality: Right Cleanser Normal Saline Discharge Instruction: Cleanse the wound with Normal Saline prior to applying a clean dressing using gauze sponges, not tissue or cotton balls. Wound Cleanser Discharge Instruction: Cleanse the wound with wound cleanser prior to applying a clean dressing using gauze sponges, not tissue or cotton balls. Peri-Wound Care Topical Primary Dressing Iodosorb Gel 10 (gm) Tube Discharge Instruction: Apply to wound bed as instructed Secondary Dressing Woven Gauze Sponge, Non-Sterile 4x4 in Discharge Instruction: Apply over primary dressing as directed. Zetuvit Plus 4x8 in Discharge Instruction: Apply over primary  dressing as directed. Secured With Elastic Bandage 4 inch (ACE bandage) Discharge Instruction: Secure with ACE bandage as directed. Kerlix Roll Sterile, 4.5x3.1 (in/yd) Discharge Instruction: Secure with Kerlix as directed. Compression Wrap Compression Stockings Add-Ons Electronic Signature(s) Signed: 07/15/2021 4:26:43 PM By: Baruch Gouty RN, BSN Entered By: Baruch Gouty on 07/15/2021 09:53:05 -------------------------------------------------------------------------------- Wound Assessment Details Patient Name: Date of Service: [name], [place] 07/15/2021 9:45 A M Medical Record Number: [id number] Patient Account Number: [id number] Date of Birth/Sex: Treating RN: April 16, 1976 (45 y.o. [name] Primary Care Othon Guardia: [name] Other Clinician: Referring Haani Bakula: Treating Arayah Krouse/Extender: [name] in Treatment: 67 Wound Status Wound Number: 2 Primary Etiology: Pressure Ulcer Wound Location: Left Calcaneus Wound Status: Open Wounding Event: Pressure Injury Comorbid History: Asthma, Angina, Hypertension Date Acquired: 10/07/2019 Weeks Of Treatment: 52 Clustered Wound: No Photos Photo Uploaded By: [name] on 07/15/2021 11:44:43 Wound Measurements Length: (cm) 2.5 Width: (cm) 0.6 Depth: (cm) 0.4 Area: (cm) 1.178 Volume: (cm) 0.471 % Reduction in Area: 95.7% % Reduction in Volume: 98.3% Epithelialization: Small (1-33%) Tunneling: No Undermining: No Wound Description Classification: Category/Stage III Wound Margin: Thickened Exudate Amount: Medium Exudate Type: Serosanguineous Exudate Color: red, brown Foul Odor After Cleansing: No Slough/Fibrino No Wound Bed Granulation Amount: Large (67-100%) Exposed Structure Granulation Quality: Red Fascia Exposed: No Necrotic Amount: None Present (0%) Fat Layer (Subcutaneous Tissue) Exposed: Yes Tendon Exposed: No Muscle Exposed: No Joint Exposed: No Bone Exposed:  No Treatment Notes Wound #2 (Calcaneus) Wound Laterality: Left Cleanser Normal Saline Discharge Instruction: Cleanse the wound with Normal Saline prior to applying a clean dressing using gauze sponges, not tissue or cotton balls. Wound Cleanser Discharge Instruction: Cleanse the wound with wound cleanser prior to applying a clean dressing using gauze sponges, not tissue or cotton balls. Peri-Wound Care Topical Primary Dressing Iodosorb Gel 10 (gm) Tube Discharge Instruction: Apply to wound bed as instructed Secondary Dressing Woven Gauze Sponge, Non-Sterile 4x4 in Discharge Instruction: Apply over primary dressing as directed. Zetuvit Plus 4x8 in Discharge Instruction: Apply over primary dressing as directed. Secured With Elastic Bandage 4 inch (ACE bandage) Discharge Instruction: Secure with ACE bandage as directed. Kerlix Roll Sterile, 4.5x3.1 (in/yd) Discharge Instruction: Secure with Kerlix as directed. Compression Wrap Compression Stockings Add-Ons Electronic Signature(s) Signed: 07/15/2021 4:26:43 PM By: Baruch Gouty RN, BSN Entered By: Baruch Gouty on 07/15/2021 09:53:23 -------------------------------------------------------------------------------- Vitals Details Patient  Name: Date of Service: [name], [place] 07/15/2021 9:45 A M Medical Record Number: [id number] Patient Account Number: [id number] Date of Birth/Sex: Treating RN: 11-18-1975 (45 y.o. [name] Primary Care Jaelani Posa: [name] Other Clinician: Referring Lashanta Elbe: Treating Sephira Zellman/Extender: [name] in Treatment: 52 Vital Signs Time Taken: 09:33 Temperature (F): 98.2 Height (in): 69 Pulse (bpm): 72 Source: Stated Respiratory Rate (breaths/min): 18 Weight (lbs): 280 Blood Pressure (mmHg): 137/85 Source: Stated Reference Range: 80 - 120 mg / dl Body Mass Index (BMI): 41.3 Electronic Signature(s) Signed: 07/15/2021 4:26:43 PM By: Baruch Gouty RN,  BSN Entered By: Baruch Gouty on 07/15/2021 09:33:23

## 2021-07-16 DIAGNOSIS — G7281 Critical illness myopathy: Secondary | ICD-10-CM | POA: Diagnosis not present

## 2021-07-16 DIAGNOSIS — M6281 Muscle weakness (generalized): Secondary | ICD-10-CM | POA: Diagnosis not present

## 2021-07-16 DIAGNOSIS — S91309A Unspecified open wound, unspecified foot, initial encounter: Secondary | ICD-10-CM | POA: Diagnosis not present

## 2021-07-16 DIAGNOSIS — M6259 Muscle wasting and atrophy, not elsewhere classified, multiple sites: Secondary | ICD-10-CM | POA: Diagnosis not present

## 2021-07-16 DIAGNOSIS — Z741 Need for assistance with personal care: Secondary | ICD-10-CM | POA: Diagnosis not present

## 2021-07-19 ENCOUNTER — Telehealth: Payer: Self-pay | Admitting: Allergy & Immunology

## 2021-07-19 MED ORDER — ALBUTEROL SULFATE HFA 108 (90 BASE) MCG/ACT IN AERS
2.0000 | INHALATION_SPRAY | RESPIRATORY_TRACT | 0 refills | Status: DC | PRN
Start: 2021-07-19 — End: 2021-09-28

## 2021-07-19 MED ORDER — BUDESONIDE-FORMOTEROL FUMARATE 160-4.5 MCG/ACT IN AERO: 2.0000 | INHALATION_SPRAY | Freq: Two times a day (BID) | RESPIRATORY_TRACT | 0 refills | Status: DC

## 2021-07-19 NOTE — Telephone Encounter (Signed)
[name] called in and states he needed refills on Symbicort and Ventolin.  He would like those sent to [facility] in [place].

## 2021-07-19 NOTE — Telephone Encounter (Signed)
Refills have been sent to the requested pharmacy. Spoke with patient, informed him that this is a courtesy refill and he would need to schedule an office visit for further refills. Patient verbalized understanding and would call back to schedule.

## 2021-07-21 ENCOUNTER — Ambulatory Visit (INDEPENDENT_AMBULATORY_CARE_PROVIDER_SITE_OTHER): Payer: BC Managed Care – PPO | Admitting: Behavioral Health

## 2021-07-21 ENCOUNTER — Other Ambulatory Visit: Payer: Self-pay

## 2021-07-21 ENCOUNTER — Encounter: Payer: Self-pay | Admitting: Behavioral Health

## 2021-07-21 VITALS — BP 139/86 | HR 81 | Ht 68.0 in | Wt 302.0 lb

## 2021-07-21 DIAGNOSIS — F329 Major depressive disorder, single episode, unspecified: Secondary | ICD-10-CM

## 2021-07-21 DIAGNOSIS — F4321 Adjustment disorder with depressed mood: Secondary | ICD-10-CM

## 2021-07-21 DIAGNOSIS — F39 Unspecified mood [affective] disorder: Secondary | ICD-10-CM | POA: Diagnosis not present

## 2021-07-21 DIAGNOSIS — F331 Major depressive disorder, recurrent, moderate: Secondary | ICD-10-CM

## 2021-07-21 DIAGNOSIS — F411 Generalized anxiety disorder: Secondary | ICD-10-CM

## 2021-07-21 MED ORDER — LAMOTRIGINE 25 MG PO TABS: ORAL_TABLET | ORAL | 1 refills | Status: DC

## 2021-07-22 ENCOUNTER — Encounter: Payer: Self-pay | Admitting: Behavioral Health

## 2021-07-22 NOTE — Progress Notes (Signed)
[facility] MD/PA/NP Initial Note  07/22/2021 3:28 PM [name]  MRN:  [id number]  Chief Complaint:  Chief Complaint   Anxiety; Depression; Establish Care; Medication Problem; Follow-up     HPI:  45 year old male presents to this office for initial visit and to establish care. Says that he developed a very severe case of Covid in 2020 that left him intubated and in coma for nearly 5 months in the hospital . Since this time he has struggled with health issues the the doctors said could take multiple years to improve. He currently is in wheel chair due to wounds on the bottom of feet. He goes to wound care treatment regularly. He says all of the this has slowly worn him down. He feels inadequate and non productive. He says that he has gained so much weight he is not the same person anymore. Says he often feels frustrated and has been very hyper focused on sex. Says that he has sought sexual relationships online outside of his marriage. Says he know its wrong but he does not feel like a man anymore is so many ways. Says he loves his wife and does not understand why he does it. Says his emotions sometimes come out as resentful anger. He endorses irritability, racing thoughts, increased interest in sex and risky foolish behaviors. He says had severe suicidal ideation in 2021 but that has subsided. He says he feels safe and would not follow through with it because of love for his family. He says his anxiety level today is 4/10 and depression is 4/10. He is sleeping 7-8 hours per night. He would like to continue on his current medication regimen but would like to try an additional medication to help with his anger and moods. Denies mania, no psychosis, no current SI. No HI.   No prior psychiatric medication trials    Visit Diagnosis:    ICD-10-CM   1. Generalized anxiety disorder  F41.1 lamoTRIgine (LAMICTAL) 25 MG tablet    2. Major depressive disorder, recurrent episode, moderate (HCC)  F33.1  lamoTRIgine (LAMICTAL) 25 MG tablet    3. Situational depression  F43.21 lamoTRIgine (LAMICTAL) 25 MG tablet    4. Unspecified mood (affective) disorder (HCC)  F39     5. Depression resistant to treatment  F32.9       Past Psychiatric History: anxiety, depression,mood disorder  Past Medical History:  Past Medical History:  Diagnosis Date   Anginal pain ([place])    with covid   Anxiety    Asthma    Dyspnea    GERD (gastroesophageal reflux disease)    Headache    History of kidney stones    LEFT URETERAL STONE   HTN (hypertension)    Pancreatitis 2018   GALLBALDDER SLUDGE CAUSED ISSUED RESOLVED   Pneumonia 07/2019   covid    Past Surgical History:  Procedure Laterality Date   CANNULATION FOR ECMO (EXTRACORPOREAL MEMBRANE OXYGENATION) N/A 08/28/2019   Procedure: CANNULATION FOR VV ECMO (EXTRACORPOREAL MEMBRANE OXYGENATION);  Surgeon: Prescott Gum, Collier Salina, MD;  Location: [facility];  Service: Open Heart Surgery;  Laterality: N/A;  CRESCENT CANNULA   CANNULATION FOR ECMO (EXTRACORPOREAL MEMBRANE OXYGENATION) N/A 09/10/2019   Procedure: CANNULATION FOR ECMO (EXTRACORPOREAL MEMBRANE OXYGENATION) PUTTING IN CRESCENT 32FR CANNULA  AND REMOVING GROING CANNULATION;  Surgeon: Wonda Olds, MD;  Location: [facility];  Service: Open Heart Surgery;  Laterality: N/A;  PUTTING IN CRESCENT/REMOVING GROIN CANNULATION   CYSTOSCOPY/URETEROSCOPY/HOLMIUM LASER/STENT PLACEMENT Left 04/18/2019   Procedure:  LEFT URETEROSCOPY/HOLMIUM LASER/STENT PLACEMENT;  Surgeon: Ardis Hughs, MD;  Location: [facility];  Service: Urology;  Laterality: Left;   CYSTOSCOPY/URETEROSCOPY/HOLMIUM LASER/STENT PLACEMENT Left 05/02/2019   Procedure: CYSTOSCOPY/URETEROSCOPY/HOLMIUM LASER/STENT EXCHANGE;  Surgeon: Ardis Hughs, MD;  Location: [facility];  Service: Urology;  Laterality: Left;   ECMO CANNULATION N/A 08/03/2019   Procedure: ECMO CANNULATION;  Surgeon: Wonda Olds, MD;  Location: [facility];   Service: Cardiovascular;  Laterality: N/A;   ESOPHAGOGASTRODUODENOSCOPY N/A 09/11/2019   Procedure: ESOPHAGOGASTRODUODENOSCOPY (EGD);  Surgeon: Wonda Olds, MD;  Location: [facility];  Service: Thoracic;  Laterality: N/A;   GRAFT APPLICATION Bilateral 8/67/6720   Procedure: APPLICATION OF SKIN GRAFT BILATERAL FEET;  Surgeon: Edrick Kins, DPM;  Location: [facility];  Service: Podiatry;  Laterality: Bilateral;   IR REPLC GASTRO/COLONIC TUBE PERCUT W/FLUORO  10/14/2019   IRRIGATION AND DEBRIDEMENT SHOULDER Right 09/29/2017   Procedure: IRRIGATION AND DEBRIDEMENT SHOULDER;  Surgeon: Leandrew Koyanagi, MD;  Location: [facility];  Service: Orthopedics;  Laterality: Right;   LUMBAR DISC SURGERY  2002   NASAL ENDOSCOPY WITH EPISTAXIS CONTROL N/A 08/31/2019   Procedure: NASAL ENDOSCOPY WITH EPISTAXIS CONTROL WITH CAUTERIZATION;  Surgeon: Melida Quitter, MD;  Location: [facility];  Service: ENT;  Laterality: N/A;   PORTACATH PLACEMENT N/A 09/11/2019   Procedure: PEG TUBE INSERTION - BEDSIDE;  Surgeon: Wonda Olds, MD;  Location: [facility];  Service: Thoracic;  Laterality: N/A;   TEE WITHOUT CARDIOVERSION N/A 08/28/2019   Procedure: TRANSESOPHAGEAL ECHOCARDIOGRAM (TEE);  Surgeon: Prescott Gum, Collier Salina, MD;  Location: [facility];  Service: Open Heart Surgery;  Laterality: N/A;   TEE WITHOUT CARDIOVERSION N/A 09/10/2019   Procedure: TRANSESOPHAGEAL ECHOCARDIOGRAM (TEE);  Surgeon: Wonda Olds, MD;  Location: [facility];  Service: Open Heart Surgery;  Laterality: N/A;   WOUND DEBRIDEMENT Bilateral 03/27/2020   Procedure: EXCISIONAL DEBRIDEMENT OF ULCERS BILATERAL FEET;  Surgeon: Edrick Kins, DPM;  Location: [facility];  Service: Podiatry;  Laterality: Bilateral;    Family Psychiatric History: see chart  Family History:  Family History  Problem Relation Age of Onset   Asthma Brother    Diabetes Father    Hypertension Father    Diabetes Paternal Grandmother    Hypertension Paternal Grandmother    Migraines Mother    GER disease Mother     Pancreatic cancer Maternal Grandmother    Heart disease Maternal Grandfather     Social History:  Social History   Socioeconomic History   Marital status: Married    Spouse name: Not on file   Number of children: 1   Years of education: Not on file   Highest education level: Not on file  Occupational History   Occupation: delivery driver  Tobacco Use   Smoking status: Never   Smokeless tobacco: Never  Vaping Use   Vaping Use: Never used  Substance and Sexual Activity   Alcohol use: Yes    Comment: rarely 2-3 times a year   Drug use: No   Sexual activity: Yes    Partners: Female  Other Topics Concern   Not on file  Social History Narrative   Not on file   Social Determinants of Health   Financial Resource Strain: Not on file  Food Insecurity: Not on file  Transportation Needs: Not on file  Physical Activity: Not on file  Stress: Not on file  Social Connections: Not on file    Allergies: No Known Allergies  Metabolic Disorder Labs: Lab  Results  Component Value Date   HGBA1C 5.7 (H) 08/31/2020   MPG 117 08/31/2020   MPG 111.15 09/23/2019   No results found for: PROLACTIN Lab Results  Component Value Date   CHOL 148 04/18/2017   TRIG 185 (H) 09/06/2019   HDL 41 04/18/2017   CHOLHDL 3.6 04/18/2017   VLDL 12 04/18/2017   LDLCALC 95 04/18/2017   No results found for: TSH  Therapeutic Level Labs: No results found for: LITHIUM No results found for: VALPROATE No components found for:  CBMZ  Current Medications: Current Outpatient Medications  Medication Sig Dispense Refill   albuterol (PROAIR HFA) 108 (90 Base) MCG/ACT inhaler Inhale 2 puffs into the lungs every 4 (four) hours as needed for wheezing or shortness of breath. 18 g 0   amLODipine (NORVASC) 2.5 MG tablet Take 1 tablet (2.5 mg total) by mouth daily. 30 tablet 5   budesonide-formoterol (SYMBICORT) 160-4.5 MCG/ACT inhaler Inhale 2 puffs into the lungs 2 (two) times daily. 10.2 g 0    DULoxetine (CYMBALTA) 60 MG capsule Take 1 capsule (60 mg total) by mouth daily. 90 capsule 3   HYDROcodone-acetaminophen (NORCO) 5-325 MG tablet Take 1 tablet by mouth every 6 (six) hours as needed for moderate pain. 60 tablet 0   lamoTRIgine (LAMICTAL) 25 MG tablet Take one tablet 25 mg for 14 day, then take two tablets 50 mg total daily. 60 tablet 1   metoprolol tartrate (LOPRESSOR) 25 MG tablet TAKE 1 TABLET BY MOUTH TWICE A DAY 180 tablet 3   montelukast (SINGULAIR) 10 MG tablet Take 10 mg by mouth daily.     pantoprazole (PROTONIX) 40 MG tablet Take 1 tablet (40 mg total) by mouth daily.     traZODone (DESYREL) 100 MG tablet Take 2 tablets (200 mg total) by mouth at bedtime. 90 tablet 3   triamcinolone (NASACORT) 55 MCG/ACT AERO nasal inhaler Place 2 sprays into the nose daily.     ARIPiprazole (ABILIFY) 2 MG tablet TAKE 1 TABLET (2 MG TOTAL) BY MOUTH AT BEDTIME. FOR RESISTANT DEPRESSION (Patient not taking: Reported on 07/21/2021) 90 tablet 0   dextromethorphan-guaiFENesin (MUCINEX DM) 30-600 MG 12hr tablet Take 1 tablet by mouth 2 (two) times daily. (Patient not taking: Reported on 07/21/2021) 30 tablet 0   guaiFENesin-dextromethorphan (ROBITUSSIN DM) 100-10 MG/5ML syrup Take 5 mLs by mouth every 4 (four) hours as needed for cough. (Patient not taking: Reported on 07/21/2021) 118 mL 0   ipratropium (ATROVENT) 0.03 % nasal spray Place 2 sprays into both nostrils 3 (three) times daily. (Patient not taking: Reported on 07/21/2021) 30 mL 5   Semaglutide (OZEMPIC, 0.25 OR 0.5 MG/DOSE, [place]) Inject into the skin. (Patient not taking: Reported on 07/21/2021)     triamcinolone cream (KENALOG) 0.1 % SMARTSIG:Liberally Topical Twice Daily PRN (Patient not taking: Reported on 07/21/2021)     No current facility-administered medications for this visit.    Medication Side Effects: none  Orders placed this visit:  No orders of the defined types were placed in this encounter.   Psychiatric Specialty  Exam:  Review of Systems  HENT:  Positive for hearing loss.   Respiratory:  Positive for shortness of breath and wheezing.   Musculoskeletal:  Positive for back pain.  Skin:  Positive for rash.  Allergic/Immunologic: Positive for environmental allergies.   Blood pressure 139/86, pulse 81, height 5' 8"  (1.727 m), weight (!) 302 lb (137 kg).Body mass index is 45.92 kg/m.  General Appearance: Casual and Neat  Eye Contact:  Good  Speech:  Clear and Coherent  Volume:  Normal  Mood:  Anxious and Depressed  Affect:  Depressed, Flat, and Anxious  Thought Process:  Coherent  Orientation:  Full (Time, Place, and Person)  Thought Content: Logical   Suicidal Thoughts:  No  Homicidal Thoughts:  No  Memory:  WNL  Judgement:  Fair  Insight:  Fair  Psychomotor Activity:  Normal  Concentration:  Concentration: Good  Recall:  Good  Fund of Knowledge: Fair  Language: Good  Assets:  Desire for Improvement Intimacy Physical Health Resilience  ADL's:  Impaired  Cognition: WNL  Prognosis:  Good   Screenings:  PHQ2-9    [facility] Office Visit from 07/21/2021 in [facility] Office Visit from 06/04/2021 in [facility] Office Visit from 03/03/2021 in [facility] Office Visit from 01/01/2021 in [facility] Office Visit from 11/04/2020 in [facility]  PHQ-2 Total Score 4 2 2  0 2  PHQ-9 Total Score 13 -- -- -- --      Flowsheet Row ED from 06/12/2021 in [facility] No Risk       Receiving Psychotherapy: No   Treatment Plan/Recommendations:   Greater than 50% of face to face time with patient was spent on counseling and coordination of care. We discussed patients chronic health challenges and associated hx of anxiety and depression. Discussed his safety when meeting people  online. Discussed medication options, side effects and his goals for tx. Recommended Psychotherapy to Pt. We agreed to: Will continue Cymbalta 60 mg daily Continue Trazodone 200 mg at bedtime Continue Abilify 2 mg daily at bedtime To start Lamictal 25 mg for 14 days, then 50 mg daily Will report any side effects or worsening symptoms To follow up in 4 weeks to reassess Provided emergency contact information Reviewed [place], NP

## 2021-07-27 NOTE — Progress Notes (Signed)
PATIENT: [name] DOB: 19-Jan-1976  REASON FOR VISIT: Follow up HISTORY FROM: Patient and his son  PRIMARY NEUROLOGIST: Dr. Krista Blue since Dr. Jannifer Franklin has retired   HISTORY OF PRESENT ILLNESS: Today 07/28/21 [name] here today for follow-up with history of critical illness myopathy and neuropathy following COVID infection in December 2020.  Sleep consult with Dr. Brett Fairy showed severe sleep, hypoxemia, 2 L nasal cannula was ordered via pulmonary uses at night. Feeling some better in the morning. He is dealing with pressure sores in his feet. Isn't able to do much, can walk some. Going to wound care center at [facility]. Sores on bottom of both feet, healing very slowly, been going for 1 year. Mostly uses wheelchair to roll around, uses regular walker. Doing some home exercises from the wheelchair with arms and legs. Symptoms: tingling in both arms, altered touch sensation can feel pressure sensation up to shoulder; numbness to mid shin, has had some feeling return to lateral sides of both legs, toes are ticklish now, this is better. Left foot drop. Takes hydrocodone for general pain in knees. Occasional sharp pains in the feet. Is having some mood issues, just started Lamictal. Also on Cymbalta, trazodone. Here with his son.   HISTORY  07/29/2020 Dr. Jannifer Franklin: [name] is a 45 year old right-handed white male with a history of a Covid infection in December 2020 that has left him with long-term deficits.  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  He has been treated for pressure sores of the feet, he had a skin graft done in August 2021, he still has open sores in the feet and is followed regularly for this.  He can walk short distances with a walker, he has not had any falls.  He has regained some strength in the arms and legs but still has a prominent foot drop on the left.  The patient does have some discomfort in the feet and in the  hands at times, he takes pain medications if needed.  He returns to this office for an evaluation.  REVIEW OF SYSTEMS: Out of a complete 14 system review of symptoms, the patient complains only of the following symptoms, and all other reviewed systems are negative.  See HPI  ALLERGIES: No Known Allergies  HOME MEDICATIONS: Outpatient Medications Prior to Visit  Medication Sig Dispense Refill   albuterol (PROAIR HFA) 108 (90 Base) MCG/ACT inhaler Inhale 2 puffs into the lungs every 4 (four) hours as needed for wheezing or shortness of breath. 18 g 0   amLODipine (NORVASC) 2.5 MG tablet Take 1 tablet (2.5 mg total) by mouth daily. 30 tablet 5   budesonide-formoterol (SYMBICORT) 160-4.5 MCG/ACT inhaler Inhale 2 puffs into the lungs 2 (two) times daily. 10.2 g 0   DULoxetine (CYMBALTA) 60 MG capsule Take 1 capsule (60 mg total) by mouth daily. 90 capsule 3   HYDROcodone-acetaminophen (NORCO) 5-325 MG tablet Take 1 tablet by mouth every 6 (six) hours as needed for moderate pain. 60 tablet 0   lamoTRIgine (LAMICTAL) 25 MG tablet Take one tablet 25 mg for 14 day, then take two tablets 50 mg total daily. 60 tablet 1   metoprolol tartrate (LOPRESSOR) 25 MG tablet TAKE 1 TABLET BY MOUTH TWICE A DAY 180 tablet 3   montelukast (SINGULAIR) 10 MG tablet Take 10 mg by mouth daily.     pantoprazole (PROTONIX) 40 MG tablet Take 1 tablet (40 mg total) by mouth daily.  traZODone (DESYREL) 100 MG tablet Take 2 tablets (200 mg total) by mouth at bedtime. 90 tablet 3   triamcinolone (NASACORT) 55 MCG/ACT AERO nasal inhaler Place 2 sprays into the nose daily.     ARIPiprazole (ABILIFY) 2 MG tablet TAKE 1 TABLET (2 MG TOTAL) BY MOUTH AT BEDTIME. FOR RESISTANT DEPRESSION (Patient not taking: Reported on 07/21/2021) 90 tablet 0   dextromethorphan-guaiFENesin (MUCINEX DM) 30-600 MG 12hr tablet Take 1 tablet by mouth 2 (two) times daily. (Patient not taking: Reported on 07/21/2021) 30 tablet 0    guaiFENesin-dextromethorphan (ROBITUSSIN DM) 100-10 MG/5ML syrup Take 5 mLs by mouth every 4 (four) hours as needed for cough. (Patient not taking: Reported on 07/21/2021) 118 mL 0   ipratropium (ATROVENT) 0.03 % nasal spray Place 2 sprays into both nostrils 3 (three) times daily. (Patient not taking: Reported on 07/21/2021) 30 mL 5   Semaglutide (OZEMPIC, 0.25 OR 0.5 MG/DOSE, [place]) Inject into the skin. (Patient not taking: Reported on 07/21/2021)     triamcinolone cream (KENALOG) 0.1 % SMARTSIG:Liberally Topical Twice Daily PRN (Patient not taking: Reported on 07/21/2021)     No facility-administered medications prior to visit.    PAST MEDICAL HISTORY: Past Medical History:  Diagnosis Date   Anginal pain ([place])    with covid   Anxiety    Asthma    Dyspnea    GERD (gastroesophageal reflux disease)    Headache    History of kidney stones    LEFT URETERAL STONE   HTN (hypertension)    Pancreatitis 2018   GALLBALDDER SLUDGE CAUSED ISSUED RESOLVED   Pneumonia 07/2019   covid    PAST SURGICAL HISTORY: Past Surgical History:  Procedure Laterality Date   CANNULATION FOR ECMO (EXTRACORPOREAL MEMBRANE OXYGENATION) N/A 08/28/2019   Procedure: CANNULATION FOR VV ECMO (EXTRACORPOREAL MEMBRANE OXYGENATION);  Surgeon: Prescott Gum, Collier Salina, MD;  Location: [facility];  Service: Open Heart Surgery;  Laterality: N/A;  CRESCENT CANNULA   CANNULATION FOR ECMO (EXTRACORPOREAL MEMBRANE OXYGENATION) N/A 09/10/2019   Procedure: CANNULATION FOR ECMO (EXTRACORPOREAL MEMBRANE OXYGENATION) PUTTING IN CRESCENT 32FR CANNULA  AND REMOVING GROING CANNULATION;  Surgeon: Wonda Olds, MD;  Location: [facility];  Service: Open Heart Surgery;  Laterality: N/A;  PUTTING IN CRESCENT/REMOVING GROIN CANNULATION   CYSTOSCOPY/URETEROSCOPY/HOLMIUM LASER/STENT PLACEMENT Left 04/18/2019   Procedure: LEFT URETEROSCOPY/HOLMIUM LASER/STENT PLACEMENT;  Surgeon: Ardis Hughs, MD;  [facility];  Service: Urology;   Laterality: Left;   CYSTOSCOPY/URETEROSCOPY/HOLMIUM LASER/STENT PLACEMENT Left 05/02/2019   Procedure: CYSTOSCOPY/URETEROSCOPY/HOLMIUM LASER/STENT EXCHANGE;  Surgeon: Ardis Hughs, MD;  Location: [facility];  Service: Urology;  Laterality: Left;   ECMO CANNULATION N/A 08/03/2019   Procedure: ECMO CANNULATION;  Surgeon: Wonda Olds, MD;  Location: [facility];  Service: Cardiovascular;  Laterality: N/A;   ESOPHAGOGASTRODUODENOSCOPY N/A 09/11/2019   Procedure: ESOPHAGOGASTRODUODENOSCOPY (EGD);  Surgeon: Wonda Olds, MD;  [facility];  Service: Thoracic;  Laterality: N/A;   GRAFT APPLICATION Bilateral 0/63/0160   Procedure: APPLICATION OF SKIN GRAFT BILATERAL FEET;  Surgeon: Edrick Kins, DPM;  Location: [facility];  Service: Podiatry;  Laterality: Bilateral;   IR REPLC GASTRO/COLONIC TUBE PERCUT W/FLUORO  10/14/2019   IRRIGATION AND DEBRIDEMENT SHOULDER Right 09/29/2017   Procedure: IRRIGATION AND DEBRIDEMENT SHOULDER;  Surgeon: Leandrew Koyanagi, MD;  Location: [facility];  Service: Orthopedics;  Laterality: Right;   LUMBAR DISC SURGERY  2002   NASAL ENDOSCOPY WITH EPISTAXIS CONTROL N/A 08/31/2019   Procedure: NASAL ENDOSCOPY WITH EPISTAXIS CONTROL WITH  CAUTERIZATION;  Surgeon: Melida Quitter, MD;  Location: [facility];  Service: ENT;  Laterality: N/A;   PORTACATH PLACEMENT N/A 09/11/2019   Procedure: PEG TUBE INSERTION - BEDSIDE;  Surgeon: Wonda Olds, MD;  Location: [facility];  Service: Thoracic;  Laterality: N/A;   TEE WITHOUT CARDIOVERSION N/A 08/28/2019   Procedure: TRANSESOPHAGEAL ECHOCARDIOGRAM (TEE);  Surgeon: Prescott Gum, Collier Salina, MD;  Location: [facility];  Service: Open Heart Surgery;  Laterality: N/A;   TEE WITHOUT CARDIOVERSION N/A 09/10/2019   Procedure: TRANSESOPHAGEAL ECHOCARDIOGRAM (TEE);  Surgeon: Wonda Olds, MD;  Location: [facility];  Service: Open Heart Surgery;  Laterality: N/A;   WOUND DEBRIDEMENT Bilateral 03/27/2020   Procedure: EXCISIONAL DEBRIDEMENT OF ULCERS BILATERAL FEET;   Surgeon: Edrick Kins, DPM;  Location[facility];  Service: Podiatry;  Laterality: Bilateral;    FAMILY HISTORY: Family History  Problem Relation Age of Onset   Asthma Brother    Diabetes Father    Hypertension Father    Diabetes Paternal Grandmother    Hypertension Paternal Grandmother    Migraines Mother    GER disease Mother    Pancreatic cancer Maternal Grandmother    Heart disease Maternal Grandfather     SOCIAL HISTORY: Social History   Socioeconomic History   Marital status: Married    Spouse name: Not on file   Number of children: 1   Years of education: Not on file   Highest education level: Not on file  Occupational History   Occupation: delivery driver  Tobacco Use   Smoking status: Never   Smokeless tobacco: Never  Vaping Use   Vaping Use: Never used  Substance and Sexual Activity   Alcohol use: Yes    Comment: rarely 2-3 times a year   Drug use: No   Sexual activity: Yes    Partners: Female  Other Topics Concern   Not on file  Social History Narrative   Not on file   Social Determinants of Health   Financial Resource Strain: Not on file  Food Insecurity: Not on file  Transportation Needs: Not on file  Physical Activity: Not on file  Stress: Not on file  Social Connections: Not on file  Intimate Partner Violence: Not on file   PHYSICAL EXAM  Vitals:   07/28/21 1333  BP: (!) 144/84  Pulse: 74  Weight: (!) 306 lb 8 oz (139 kg)  Height: 5' 8"  (1.727 m)   Body mass index is 46.6 kg/m.  Generalized: Well developed, in no acute distress  Neurological examination  Mentation: Alert oriented to time, place, history taking. Follows all commands speech and language fluent Cranial nerve II-XII: Pupils were equal round reactive to light. Extraocular movements were full, visual field were full on confrontational test. Facial sensation and strength were normal. Head turning and shoulder shrug were normal and symmetric. Motor: Good strength overall,  left foot drop  Sensory: Length dependent sensory deficit to pinprick reports, but soft touch vibration is well maintained in all extremities, of note the feet are wrapped and in healing shoes Coordination: Cerebellar testing reveals good finger-nose-finger and heel-to-shin bilaterally.  Gait and station: In wheelchair, but can stand and take a few steps in the room independently  Reflexes: Deep tendon reflexes are symmetric but depressed  DIAGNOSTIC DATA (LABS, IMAGING, TESTING) - I reviewed patient records, labs, notes, testing and imaging myself where available.  Lab Results  Component Value Date   WBC 7.7 08/31/2020   HGB 16.0 08/31/2020   HCT 47.3 08/31/2020  MCV 83.6 08/31/2020   PLT 138 (L) 08/31/2020      Component Value Date/Time   NA 140 08/31/2020 0000   K 4.2 08/31/2020 0000   CL 103 08/31/2020 0000   CO2 28 08/31/2020 0000   GLUCOSE 129 (H) 08/31/2020 0000   BUN 20 08/31/2020 0000   CREATININE 1.10 08/31/2020 0000   CALCIUM 9.1 08/31/2020 0000   PROT 7.3 08/31/2020 0000   ALBUMIN 2.9 (L) 12/02/2019 0628   AST 14 08/31/2020 0000   ALT 11 08/31/2020 0000   ALKPHOS 76 12/02/2019 0628   BILITOT 0.3 08/31/2020 0000   GFRNONAA 80 08/31/2020 0000   GFRAA 93 08/31/2020 0000   Lab Results  Component Value Date   CHOL 148 04/18/2017   HDL 41 04/18/2017   LDLCALC 95 04/18/2017   TRIG 185 (H) 09/06/2019   CHOLHDL 3.6 04/18/2017   Lab Results  Component Value Date   HGBA1C 5.7 (H) 08/31/2020   No results found for: VITAMINB12 No results found for: TSH  ASSESSMENT AND PLAN 45 y.o. year old male  has a past medical history of Anginal pain ([place]), Anxiety, Asthma, Dyspnea, GERD (gastroesophageal reflux disease), Headache, History of kidney stones, HTN (hypertension), Pancreatitis (2018), and Pneumonia (07/2019). here with:  Critical illness myopathy and neuropathy   -has made some improvement in his sensory deficits, some return of sensation to his upper and lower  extremities  -is still dealing with pressure wounds on his feet that has limited his mobility (pressure wounds since the time in ICU with COVID), was hospitalized for 207 days, initial COVID in Dec 2020 -recommend continue to move about as much as possible safely, is not able to bear weight for much time, never got the rolling walker -If pain to extremities becomes an issue, could add in gabapentin, already taking Cymbalta, Hydrocodone -Now using home 2 L O2 for sleep -May consider EMG/NCV in future, were not done prior as not clear would alter medical therapy, I talked with the patient about this, prefers to consider at next visit since still dealing with pressure sores to the feet -Treatment is maintenance of good nutrition and physical/OT -He will see Dr. Krista Blue, now that Dr. Jannifer Franklin has retired, I will set up a follow up in 5 months, he should let me know of any new or worsening symptoms  I spent 32 minutes of face-to-face and non-face-to-face time with patient.  This included previsit chart review, lab review, study review, order entry, electronic health record documentation, patient education, discussing his symptoms, plan going forward.   Butler Denmark, AGNP-C, DNP 07/28/2021, 2:08 PM [facility] [address] [place], [place] [address] [phone]

## 2021-07-28 ENCOUNTER — Ambulatory Visit (INDEPENDENT_AMBULATORY_CARE_PROVIDER_SITE_OTHER): Payer: BC Managed Care – PPO | Admitting: Neurology

## 2021-07-28 ENCOUNTER — Encounter: Payer: Self-pay | Admitting: Neurology

## 2021-07-28 VITALS — BP 144/84 | HR 74 | Ht 68.0 in | Wt 306.5 lb

## 2021-07-28 DIAGNOSIS — G6281 Critical illness polyneuropathy: Secondary | ICD-10-CM

## 2021-07-28 NOTE — Patient Instructions (Signed)
Encourage you to move as much as possible, practice good nutrition Will see you back in 5 months with Dr. Krista Blue

## 2021-07-29 ENCOUNTER — Other Ambulatory Visit: Payer: Self-pay

## 2021-07-29 ENCOUNTER — Encounter (HOSPITAL_BASED_OUTPATIENT_CLINIC_OR_DEPARTMENT_OTHER): Payer: BC Managed Care – PPO | Admitting: Internal Medicine

## 2021-07-29 DIAGNOSIS — L97512 Non-pressure chronic ulcer of other part of right foot with fat layer exposed: Secondary | ICD-10-CM | POA: Diagnosis not present

## 2021-07-29 DIAGNOSIS — L89623 Pressure ulcer of left heel, stage 3: Secondary | ICD-10-CM | POA: Diagnosis not present

## 2021-07-29 DIAGNOSIS — L89613 Pressure ulcer of right heel, stage 3: Secondary | ICD-10-CM | POA: Diagnosis not present

## 2021-07-29 DIAGNOSIS — L97522 Non-pressure chronic ulcer of other 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: Secondary | ICD-10-CM | POA: Diagnosis not present

## 2021-07-29 NOTE — Progress Notes (Signed)
[name] ([id number]) Visit Report for 07/29/2021 Debridement Details Patient Name: Date of Service: [name], [name] 07/29/2021 9:45 A M Medical Record Number: [id number] Patient Account Number: [id number] Date of Birth/Sex: Treating RN: Jul 21, 1976 (45 y.o. [name] Primary Care Provider: Cristie [name] Other Clinician: Referring Provider: Treating Provider/Extender: [name] in Treatment: 54 Debridement Performed for Assessment: Wound #1 Right Calcaneus Performed By: Physician [name], MD Debridement Type: Debridement Level of Consciousness (Pre-procedure): Awake and Alert Pre-procedure Verification/Time Out Yes - 10:35 Taken: Start Time: 10:36 Pain Control: Lidocaine 4% T opical Solution T Area Debrided (L x W): otal 1.6 (cm) x 0.8 (cm) = 1.28 (cm) Tissue and other material debrided: Viable, Non-Viable, Callus, Slough, Subcutaneous, Skin: Epidermis, Slough Level: Skin/Subcutaneous Tissue Debridement Description: Excisional Instrument: Curette Bleeding: Minimum Hemostasis Achieved: Silver Nitrate Procedural Pain: 2 Post Procedural Pain: 3 Response to Treatment: Procedure was tolerated well Level of Consciousness (Post- Awake and Alert procedure): Post Debridement Measurements of Total Wound Length: (cm) 1.6 Stage: Category/Stage III Width: (cm) 0.8 Depth: (cm) 0.5 Volume: (cm) 0.503 Character of Wound/Ulcer Post Debridement: Requires Further Debridement Post Procedure Diagnosis Same as Pre-procedure Electronic Signature(s) Signed: 07/29/2021 3:43:54 PM By: [name] Ham MD Signed: 07/29/2021 5:08:09 PM By: [name] Pilling RN, BSN Entered By: [name] on 07/29/2021 10:54:33 -------------------------------------------------------------------------------- Debridement Details Patient Name: Date of Service: [name], [name] 07/29/2021 9:45 A M Medical Record Number: [id number] Patient Account Number:  [id number] Date of Birth/Sex: Treating RN: 06-12-1976 (45 y.o. [name] Primary Care Provider: Cristie [name] Other Clinician: Referring Provider: Treating Provider/Extender: [name] in Treatment: 54 Debridement Performed for Assessment: Wound #2 Left Calcaneus Performed By: Physician [name], MD Debridement Type: Debridement Level of Consciousness (Pre-procedure): Awake and Alert Pre-procedure Verification/Time Out Yes - 10:35 Taken: Start Time: 10:36 Pain Control: Lidocaine 4% T opical Solution T Area Debrided (L x W): otal 3 (cm) x 0.9 (cm) = 2.7 (cm) Tissue and other material debrided: Viable, Non-Viable, Callus, Slough, Subcutaneous, Skin: Epidermis, Slough Level: Skin/Subcutaneous Tissue Debridement Description: Excisional Instrument: Curette Bleeding: Minimum Hemostasis Achieved: Silver Nitrate Procedural Pain: 2 Post Procedural Pain: 3 Response to Treatment: Procedure was tolerated well Level of Consciousness (Post- Awake and Alert procedure): Post Debridement Measurements of Total Wound Length: (cm) 3 Stage: Category/Stage III Width: (cm) 0.9 Depth: (cm) 0.4 Volume: (cm) 0.848 Character of Wound/Ulcer Post Debridement: Requires Further Debridement Post Procedure Diagnosis Same as Pre-procedure Electronic Signature(s) Signed: 07/29/2021 3:43:54 PM By: [name] Ham MD Signed: 07/29/2021 5:08:09 PM By: [name] Pilling RN, BSN Entered By: [name] on 07/29/2021 10:54:46 -------------------------------------------------------------------------------- HPI Details Patient Name: Date of Service: [name], [name] 07/29/2021 9:45 A M Medical Record Number: [id number] Patient Account Number: [id number] Date of Birth/Sex: Treating RN: Jun 29, 1976 (45 y.o. [name] Primary Care Provider: Cristie [name] Other Clinician: Referring Provider: Treating Provider/Extender: [name] in Treatment:  97 History of Present Illness HPI Description: Wounds are12/03/2020 upon evaluation today patient presents for initial inspection here in our clinic concerning issues he has been having with the bottoms of his feet bilaterally. He states these actually occurred as wounds when he was hospitalized for 5 months secondary to Covid. He was apparently with tilting bed where he was in an upright position quite frequently and apparently this occurred in some way shape or form during that time. Fortunately there is no sign of active infection at this time. No fevers, chills,  nausea, vomiting, or diarrhea. With that being said he still has substantial wounds on the plantar aspects of his feet Theragen require quite a bit of work to get these to heal. He has been using Santyl currently though that is been problematic both in receiving the medication as well as actually paid for it as it is become quite expensive. Prior to the experience with Covid the patient really did not have any major medical problems other than hypertension he does have some mild generalized weakness following the Covid experience. 07/22/2020 on evaluation today patient appears to be doing okay in regard to his foot ulcers I feel like the wound beds are showing signs of better improvement that I do believe the Iodoflex is helping in this regard. With that being said he does have a lot of drainage currently and this is somewhat blue/green in nature which is consistent with Pseudomonas. I do think a culture today would be appropriate for [place] to evaluate and see if that is indeed the case I would likely start him on antibiotic orally as well he is not allergic to Cipro knows of no issues he has had in the past 12/21; patient was admitted to the clinic earlier this month with bilateral presumed pressure ulcers on the bottom of his feet apparently related to excessive pressure from a tilt table arrangement in the intensive care unit. Patient  relates this to being on ECMO but I am not really sure that is exactly related to that. I must say I have never seen anything like this. He has fairly extensive full-thickness wounds extending from his heel towards his midfoot mostly centered laterally. There is already been some healing distally. He does not appear to have an arterial issue. He has been using gentamicin to the wound surfaces with Iodoflex to help with ongoing debridement 1/6; this is a patient with pressure ulcers on the bottom of his feet related to excessive pressure from a standing position in the intensive care unit. He is complaining of a lot of pain in the right heel. He is not a diabetic. 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 We have been using Iodoflex to help prepare the surfaces of both wounds for an advanced treatment product. He is nonambulatory spending most of his time in a wheelchair I have asked him not to propel the wheelchair with his heels 1/13; in general his wounds look better not much surface area change we have been using Iodoflex as of last week. I did an x-ray of the right heel as the patient was complaining of pain. I had some thoughts about a stress fracture perhaps Achilles tendon problems however what it showed was erosive changes along the inferior aspect of the calcaneus he now has a MRI booked for 1/20. 1/20; in general his wounds continue to be better. Some improvement in the large narrow areas proximally in his foot. He is still complaining of pain in the right heel and tenderness in certain areas of this wound. His MRI is tonight. I am not just looking for osteomyelitis that was brought up on the x-ray I am wondering about stress fractures, tendon ruptures etc. He has no such findings on the left. Also noteworthy is that the patient had critical illness neuropathy and some of the discomfort may be actual improvement in nerve function I am just not sure. These wounds were  initially in the setting of severe critical illness related to COVID-19. He was put in a  standing position. He may have also been on pressors at the point contributing to tissue ischemia. By his description at some point these wounds were grossly necrotic extending proximally up into the Achilles part of his heel. I do not know that I have ever really seen pictures of them like this although they may exist in epic We have ordered Tri layer Oasis. I am trying to stimulate some granulation in these areas. This is of course assuming the MRI is negative for infection 1/27; since the patient was last here he saw Dr. Juleen China of infectious disease. He is planned for vancomycin and ceftriaxone. Prior operative culture grew MSSA. Also ordered baseline lab work. He also ordered arterial studies although the ABIs in our clinic were normal as well as his clinical exam these were normal I do not think he needs to see vascular surgery. His ABIs at the PTA were 1.22 in the right triphasic waveforms with a normal TBI of 1.15 on the left ABI of 1.22 with triphasic waveforms and a normal TBI of 1.08. Finally he saw Dr. Amalia Hailey who will follow him in for 2 months. At this point I do not think he felt that he needed a procedure on the right calcaneal bone. Dr. Juleen China is elected for broad-spectrum antibiotic The patient is still having pain in the right heel. He walks with a walker 2/3; wounds are generally smaller. He is tolerating his IV antibiotics. I believe this is vancomycin and ceftriaxone. We are still waiting for Oasis burn in terms of his out-of-pocket max which he should be meeting soon given the IV antibiotics, MRIs etc. I have asked him to check in on this. We are using silver collagen in the meantime the wounds look better 2/10; tolerating IV vancomycin and Rocephin. We are waiting to apply for Oasis. Although I am not really sure where he is in his out-of-pocket max. 2/17 started the first application of  Oasis trilayer. Still on antibiotics. The wounds have generally look better. The area on the left has a little more surface slough requiring debridement 3/79; second application of Oasis trilayer. The wound surface granulation is generally look better. The area on the left with undermining laterally I think is come in a bit. 10/08/2020 upon evaluation today patient is here today for [employer] application #3. Fortunately he seems to be doing extremely well with regard to this and we are seeing a lot of new epithelial growth which is great news. Fortunately there is no signs of active infection at this time. 10/16/2020 upon evaluation today patient appears to be doing well with regard to his foot ulcers. Do believe the Oasis has been of benefit for him. I do not see any signs of infection right now which is great news and I think that he has a lot of new epithelial growth which is great to see as well. The patient is very pleased to hear all of this. I do think we can proceed with the Oasis trilayer #4 today. 3/18; not as much improvement in these areas on his heels that I was hoping. I did reapply trilateral Oasis today the tissue looks healthier but not as much fill in as I was hoping. 3/25; better looking today I think this is come in a bit the tissue looks healthier. Triple layer Oasis reapplied #6 4/1; somewhat better looking definitely better looking surface not as much change in surface area as I was hoping. He may be spending more time [place] on days  then he needs to although he does have heel offloading boots. Triple layer Oasis reapplied #7 4/7; unfortunately apparently [facility] will not approve any further Oasis which is unfortunate since the patient did respond nicely both in terms of the condition of the wound bed as well as surface area. There is still some drainage coming from the wound but not a lot there does not appear to be any infection 4/15; we have been using  Hydrofera Blue. He continues to have nice rims of epithelialization on the right greater than the left. The left the epithelialization is coming from the tip of his heel. There is moderate drainage. In this that concerns me about a total contact cast. There is no evidence of infection 4/29; patient has been using Hydrofera Blue with dressing changes. He has no complaints or issues today. 5/5; using Hydrofera Blue. I actually think that he looks marginally better than the last time I saw this 3 weeks ago. There are rims of epithelialization on the left thumb coming from the medial side on the right. Using Hydrofera Blue 5/12; using Hydrofera Blue. These continue to make improvements in surface area. His drainage was not listed as severe I therefore went ahead and put a cast on the left foot. Right foot we will continue to dress his previous 5/16; back for first total contact cast change. He did not tolerate this particularly well cast injury on the anterior tibia among other issues. Difficulty sleeping. I talked him about this in some detail and afterwards is elected to continue. I told him I would like to have a cast on for 3 weeks to see if this is going to help at all. I think he agreed 5/19; I think the wound is better. There is no tunneling towards his midfoot. The undermining medially also looks better. He has a rim of new skin distally. I think we are making progress here. The area on the left also continues to look somewhat better to me using Hydrofera Blue. He has a list of complaints about the cast but none of them seem serious 5/26; patient presents for 1 week follow-up. He has been using a total contact cast and tolerating this well. Hydrofera Blue is the main dressing used. He denies signs of infection. 6/2 Hydrofera Blue total contact cast on the left. These were large ulcers that formed in intensive care unit where the patient was recovering from [place]. May have had something to do with  being ventilated in an upright positiono Pressors etc. We have been able to get the areas down considerably and a viable surface. There is some epithelialization in both sides. Note made of drainage 6/9; changed to [facility] last time because of drainage. He arrives with better looking surfaces and dimensions on the left than the right. Paradoxically the right actually probes more towards his midfoot the left is largely close down but both of these look improved. Using a total contact cast on the left 6/16; complex wounds on his bilateral plantar heels which were initially pressure injury from a stay in the ICU with COVID. We have been using silver alginate most recently. His dimensions of come in quite dramatically however not recently. We have been putting the left foot in a total contact cast 6/23; complex wounds on the bilateral plantar heels. I been putting the left in the cast paradoxically the area on the right is the one that is going towards closure at a faster rate. Quite a  bit of drainage on the left. The patient went to see Dr. Amalia Hailey who said he was going to standby for skin grafts. I had actually considered sending him for skin grafts however he would be mandatorily off his feet for a period of weeks to months. I am thinking that the area on the right is going to close on its own the area on the left has been more stubborn even though we have him in a total contact cast 6/30; took him out of a total contact cast last week is the right heel seem to be making better progress than the left where I was placing the cast. We are using silver alginate. Both wounds are smaller right greater than left 7/12; both wounds look as though they are making some progress. We are using silver alginate. Heel offloading boots 7/26; very gradual progress especially on the right. Using silver alginate. He is wearing heel offloading boots 8/18; he continues to close these wounds down very gradually. Using  silver alginate. The problem polymen being definitive about this is areas of what appears to be callus around the margins. This is not a 100% of the area but certainly sizable especially on the right 9/1; bilateral plantar feet wounds secondary to prolonged pressure while being ventilated for COVID-19 in an upright position. Essentially pressure ulcers on the bottom of his feet. He is made substantial progress using silver alginate. 9/14; bilateral plantar feet wounds secondary to prolonged pressure. Making progress using silver alginate. 9/29 bilateral plantar feet wounds secondary to prolonged pressure. I changed him to Iodoflex last week. MolecuLight showing reddened blush fluorescence 10/11; patient presents for follow-up. He has no issues or complaints today. He denies signs of infection. He continues to use Iodoflex and antibiotic ointment to the wound beds. 10/27; 2-week follow-up. No evidence of infection. He has callus and thick dry skin around the wound margins we have been using Iodoflex and Bactroban which was in response to a moderate left MolecuLight reddish blush fluorescence. 11/10; 2-week follow-up. Wound margins again have thick callus however the measurements of the actual wound sites are a lot smaller. Everything looks reasonably healthy here. We have been using Iodoflex He was approved for prime matrix but I have elected to delay this given the improvement in the surface area. Hopefully I will not regret that decision as were getting close to the end of the year in terms of insurance payment 12/8; 2-week follow-up. Wounds are generally smaller in size. These were initially substantial wounds extending into the forefoot all the way into the heel on the bilateral plantar feet. They are now both located on the plantar heel distal aspect both of these have a lot of callus around the wounds I used a #5 curette to remove this on the right and the left also some subcutaneous debris to  try and get the wound edges were using Iodoflex. He has heel offloading shoe 12/22; 2-week follow-up. Not really much improvement. He has thick callus around the outer edges of both wounds. I remove this there is some nonviable subcutaneous tissue as well. We have been using Iodoflex. Her intake nurse and myself spontaneously thought of a total contact cast I went back in May. At that time we really were not seeing much of an improvement with a cast although the wound was in a much different situation I would like to retry this in 2 weeks and I discussed this with the patient Electronic Signature(s) Signed: 07/29/2021 3:43:54 PM By:  [name] Ham MD Entered By: [name] on 07/29/2021 11:00:21 -------------------------------------------------------------------------------- Physical Exam Details Patient Name: Date of Service: [name] 07/29/2021 9:45 A M Medical Record Number: [id number] Patient Account Number: [id number] Date of Birth/Sex: Treating RN: 1975-10-10 (45 y.o. [name] Primary Care Provider: Cristie [name] Other Clinician: Referring Provider: Treating Provider/Extender: [name] in Treatment: 46 Constitutional Sitting or standing Blood Pressure is within target range for patient.. Pulse regular and within target range for patient.Marland Kitchen Respirations regular, non-labored and within target range.. Temperature is normal and within the target range for the patient.Marland Kitchen Appears in no distress. Notes Wound exam; bilateral plantar heel wounds. These were pressure areas caused by prolonged standing in a ventilatory position in the ICU with COVID-19. He has eschar around both wound edges. This would suggest excessive pressure. I removed both of these. 2. In 2 weeks I would like to put a total contact cast on the left. Although we did not have such a great experience with that in May of this year the wound is in a different condition Electronic  Signature(s) Signed: 07/29/2021 3:43:54 PM By: [name] Ham MD Entered By: [name] on 07/29/2021 11:02:30 -------------------------------------------------------------------------------- Physician Orders Details Patient Name: Date of Service: [name], [place] 07/29/2021 9:45 A M Medical Record Number: [id number] Patient Account Number: [id number] Date of Birth/Sex: Treating RN: November 13, 1975 (45 y.o. [name] Primary Care Provider: Cristie [name] Other Clinician: Referring Provider: Treating Provider/Extender: [name] in Treatment: 58 Verbal / Phone Orders: No Diagnosis Coding ICD-10 Coding Code Description [phone] Non-pressure chronic ulcer of other part of right foot with fat layer exposed L97.522 Non-pressure chronic ulcer of other 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 Follow-up Appointments ppointment in 2 weeks. - Dr. Dellia Nims Return A Other: - plan total contact cast next visit, allow extra time **60 minutes** Bathing/ Shower/ Hygiene May shower with protection but do not get wound dressing(s) wet. Edema Control - Lymphedema / SCD / Other Bilateral Lower Extremities Elevate legs to the level of the heart or above for 30 minutes daily and/or when sitting, a frequency of: - throughout the day Avoid standing for long periods of time. Moisturize legs daily. - right leg and foot every night. Off-Loading Wedge shoe to: - Glophed Shoe to both feet. Other: - keep pressure off of the bottom of your feet Additional Orders / Instructions Follow Nutritious Diet Wound Treatment Wound #1 - Calcaneus Wound Laterality: Right Cleanser: Normal Saline (Generic) Every Other Day/30 Days Discharge Instructions: Cleanse the wound with Normal Saline prior to applying a clean dressing using gauze sponges, not tissue or cotton balls. Cleanser: Wound Cleanser Every Other Day/30 Days Discharge Instructions: Cleanse the wound with wound cleanser prior to  applying a clean dressing using gauze sponges, not tissue or cotton balls. Prim Dressing: Iodosorb Gel 10 (gm) Tube (Generic) Every Other Day/30 Days ary Discharge Instructions: Apply to wound bed as instructed Secondary Dressing: Woven Gauze Sponge, Non-Sterile 4x4 in (Generic) Every Other Day/30 Days Discharge Instructions: Apply over primary dressing as directed. Secondary Dressing: Zetuvit Plus 4x8 in (Generic) Every Other Day/30 Days Discharge Instructions: Apply over primary dressing as directed. Secured With: Elastic Bandage 4 inch (ACE bandage) (Generic) Every Other Day/30 Days Discharge Instructions: Secure with ACE bandage as directed. Secured With: [employer], 4.5x3.1 (in/yd) (Generic) Every Other Day/30 Days Discharge Instructions: Secure with Kerlix as directed. Wound #2 - Calcaneus Wound Laterality: Left Cleanser: Normal Saline (Generic)  Every Other Day/30 Days Discharge Instructions: Cleanse the wound with Normal Saline prior to applying a clean dressing using gauze sponges, not tissue or cotton balls. Cleanser: Wound Cleanser Every Other Day/30 Days Discharge Instructions: Cleanse the wound with wound cleanser prior to applying a clean dressing using gauze sponges, not tissue or cotton balls. Prim Dressing: Iodosorb Gel 10 (gm) Tube (Generic) Every Other Day/30 Days ary Discharge Instructions: Apply to wound bed as instructed Secondary Dressing: Woven Gauze Sponge, Non-Sterile 4x4 in (Generic) Every Other Day/30 Days Discharge Instructions: Apply over primary dressing as directed. Secondary Dressing: Zetuvit Plus 4x8 in (Generic) Every Other Day/30 Days Discharge Instructions: Apply over primary dressing as directed. Secured With: Elastic Bandage 4 inch (ACE bandage) (Generic) Every Other Day/30 Days Discharge Instructions: Secure with ACE bandage as directed. Secured With: [employer], 4.5x3.1 (in/yd) (Generic) Every Other Day/30 Days Discharge  Instructions: Secure with Kerlix as directed. Electronic Signature(s) Signed: 07/29/2021 3:43:54 PM By: [name] Ham MD Signed: 07/29/2021 4:56:50 PM By: Baruch Gouty RN, BSN Entered By: Baruch Gouty on 07/29/2021 10:42:49 -------------------------------------------------------------------------------- Problem List Details Patient Name: Date of Service: [name], [place] 07/29/2021 9:45 A M Medical Record Number: [id number] Patient Account Number: [id number] Date of Birth/Sex: Treating RN: [birth year] (45 y.o. [name] Primary Care Provider: Cristie [name] Other Clinician: Referring Provider: Treating Provider/Extender: [name] in Treatment: 26 Active Problems ICD-10 Encounter Code Description Active Date MDM Diagnosis [phone] Non-pressure chronic ulcer of other part of right foot with fat layer exposed 09/03/2020 No Yes L97.522 Non-pressure chronic ulcer of other 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 09/03/2020 No Yes Inactive Problems ICD-10 Code Description Active Date Inactive Date L89.893 Pressure ulcer of other site, stage 3 07/15/2020 07/15/2020 M62.81 Muscle weakness (generalized) 07/15/2020 07/15/2020 I10 Essential (primary) hypertension 07/15/2020 07/15/2020 M86.171 Other acute osteomyelitis, right ankle and foot 09/03/2020 09/03/2020 Resolved Problems Electronic Signature(s) Signed: 07/29/2021 3:43:54 PM By: [name] Ham MD Entered By: [name] on 07/29/2021 10:54:03 -------------------------------------------------------------------------------- Progress Note Details Patient Name: Date of Service: [name], [place] 07/29/2021 9:45 A M Medical Record Number: [id number] Patient Account Number: [id number] Date of Birth/Sex: Treating RN: 04-06-1976 (45 y.o. [name] Primary Care Provider: Cristie [name] Other Clinician: Referring Provider: Treating Provider/Extender: [name] in Treatment:  77 Subjective History of Present Illness (HPI) Wounds are12/03/2020 upon evaluation today patient presents for initial inspection here in our clinic concerning issues he has been having with the bottoms of his feet bilaterally. He states these actually occurred as wounds when he was hospitalized for 5 months secondary to Covid. He was apparently with tilting bed where he was in an upright position quite frequently and apparently this occurred in some way shape or form during that time. Fortunately there is no sign of active infection at this time. No fevers, chills, nausea, vomiting, or diarrhea. With that being said he still has substantial wounds on the plantar aspects of his feet Theragen require quite a bit of work to get these to heal. He has been using Santyl currently though that is been problematic both in receiving the medication as well as actually paid for it as it is become quite expensive. Prior to the experience with Covid the patient really did not have any major medical problems other than hypertension he does have some mild generalized weakness following the Covid experience. 07/22/2020 on evaluation today patient appears to be doing okay in regard to his foot ulcers I  feel like the wound beds are showing signs of better improvement that I do believe the Iodoflex is helping in this regard. With that being said he does have a lot of drainage currently and this is somewhat blue/green in nature which is consistent with Pseudomonas. I do think a culture today would be appropriate for [place] to evaluate and see if that is indeed the case I would likely start him on antibiotic orally as well he is not allergic to Cipro knows of no issues he has had in the past 12/21; patient was admitted to the clinic earlier this month with bilateral presumed pressure ulcers on the bottom of his feet apparently related to excessive pressure from a tilt table arrangement in the intensive care unit. Patient  relates this to being on ECMO but I am not really sure that is exactly related to that. I must say I have never seen anything like this. He has fairly extensive full-thickness wounds extending from his heel towards his midfoot mostly centered laterally. There is already been some healing distally. He does not appear to have an arterial issue. He has been using gentamicin to the wound surfaces with Iodoflex to help with ongoing debridement 1/6; this is a patient with pressure ulcers on the bottom of his feet related to excessive pressure from a standing position in the intensive care unit. He is complaining of a lot of pain in the right heel. He is not a diabetic. 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 We have been using Iodoflex to help prepare the surfaces of both wounds for an advanced treatment product. He is nonambulatory spending most of his time in a wheelchair I have asked him not to propel the wheelchair with his heels 1/13; in general his wounds look better not much surface area change we have been using Iodoflex as of last week. I did an x-ray of the right heel as the patient was complaining of pain. I had some thoughts about a stress fracture perhaps Achilles tendon problems however what it showed was erosive changes along the inferior aspect of the calcaneus he now has a MRI booked for 1/20. 1/20; in general his wounds continue to be better. Some improvement in the large narrow areas proximally in his foot. He is still complaining of pain in the right heel and tenderness in certain areas of this wound. His MRI is tonight. I am not just looking for osteomyelitis that was brought up on the x-ray I am wondering about stress fractures, tendon ruptures etc. He has no such findings on the left. Also noteworthy is that the patient had critical illness neuropathy and some of the discomfort may be actual improvement in nerve function I am just not sure. These wounds were  initially in the setting of severe critical illness related to COVID-19. He was put in a standing position. He may have also been on pressors at the point contributing to tissue ischemia. By his description at some point these wounds were grossly necrotic extending proximally up into the Achilles part of his heel. I do not know that I have ever really seen pictures of them like this although they may exist in epic We have ordered Tri layer Oasis. I am trying to stimulate some granulation in these areas. This is of course assuming the MRI is negative for infection 1/27; since the patient was last here he saw Dr. Juleen China of infectious disease. He is planned for vancomycin and ceftriaxone. Prior operative  culture grew MSSA. Also ordered baseline lab work. He also ordered arterial studies although the ABIs in our clinic were normal as well as his clinical exam these were normal I do not think he needs to see vascular surgery. His ABIs at the PTA were 1.22 in the right triphasic waveforms with a normal TBI of 1.15 on the left ABI of 1.22 with triphasic waveforms and a normal TBI of 1.08. Finally he saw Dr. Amalia Hailey who will follow him in for 2 months. At this point I do not think he felt that he needed a procedure on the right calcaneal bone. Dr. Juleen China is elected for broad-spectrum antibiotic The patient is still having pain in the right heel. He walks with a walker 2/3; wounds are generally smaller. He is tolerating his IV antibiotics. I believe this is vancomycin and ceftriaxone. We are still waiting for Oasis burn in terms of his out-of-pocket max which he should be meeting soon given the IV antibiotics, MRIs etc. I have asked him to check in on this. We are using silver collagen in the meantime the wounds look better 2/10; tolerating IV vancomycin and Rocephin. We are waiting to apply for Oasis. Although I am not really sure where he is in his out-of-pocket max. 2/17 started the first application of  Oasis trilayer. Still on antibiotics. The wounds have generally look better. The area on the left has a little more surface slough requiring debridement 0/86; second application of Oasis trilayer. The wound surface granulation is generally look better. The area on the left with undermining laterally I think is come in a bit. 10/08/2020 upon evaluation today patient is here today for [employer] application #3. Fortunately he seems to be doing extremely well with regard to this and we are seeing a lot of new epithelial growth which is great news. Fortunately there is no signs of active infection at this time. 10/16/2020 upon evaluation today patient appears to be doing well with regard to his foot ulcers. Do believe the Oasis has been of benefit for him. I do not see any signs of infection right now which is great news and I think that he has a lot of new epithelial growth which is great to see as well. The patient is very pleased to hear all of this. I do think we can proceed with the Oasis trilayer #4 today. 3/18; not as much improvement in these areas on his heels that I was hoping. I did reapply trilateral Oasis today the tissue looks healthier but not as much fill in as I was hoping. 3/25; better looking today I think this is come in a bit the tissue looks healthier. Triple layer Oasis reapplied #6 4/1; somewhat better looking definitely better looking surface not as much change in surface area as I was hoping. He may be spending more time [place] on days then he needs to although he does have heel offloading boots. Triple layer Oasis reapplied #7 4/7; unfortunately apparently [facility] will not approve any further Oasis which is unfortunate since the patient did respond nicely both in terms of the condition of the wound bed as well as surface area. There is still some drainage coming from the wound but not a lot there does not appear to be any infection 4/15; we have been using  Hydrofera Blue. He continues to have nice rims of epithelialization on the right greater than the left. The left the epithelialization is coming from the tip of his  heel. There is moderate drainage. In this that concerns me about a total contact cast. There is no evidence of infection 4/29; patient has been using Hydrofera Blue with dressing changes. He has no complaints or issues today. 5/5; using Hydrofera Blue. I actually think that he looks marginally better than the last time I saw this 3 weeks ago. There are rims of epithelialization on the left thumb coming from the medial side on the right. Using Hydrofera Blue 5/12; using Hydrofera Blue. These continue to make improvements in surface area. His drainage was not listed as severe I therefore went ahead and put a cast on the left foot. Right foot we will continue to dress his previous 5/16; back for first total contact cast change. He did not tolerate this particularly well cast injury on the anterior tibia among other issues. Difficulty sleeping. I talked him about this in some detail and afterwards is elected to continue. I told him I would like to have a cast on for 3 weeks to see if this is going to help at all. I think he agreed 5/19; I think the wound is better. There is no tunneling towards his midfoot. The undermining medially also looks better. He has a rim of new skin distally. I think we are making progress here. The area on the left also continues to look somewhat better to me using Hydrofera Blue. He has a list of complaints about the cast but none of them seem serious 5/26; patient presents for 1 week follow-up. He has been using a total contact cast and tolerating this well. Hydrofera Blue is the main dressing used. He denies signs of infection. 6/2 Hydrofera Blue total contact cast on the left. These were large ulcers that formed in intensive care unit where the patient was recovering from [place]. May have had something to do with  being ventilated in an upright positiono Pressors etc. We have been able to get the areas down considerably and a viable surface. There is some epithelialization in both sides. Note made of drainage 6/9; changed to [facility] last time because of drainage. He arrives with better looking surfaces and dimensions on the left than the right. Paradoxically the right actually probes more towards his midfoot the left is largely close down but both of these look improved. Using a total contact cast on the left 6/16; complex wounds on his bilateral plantar heels which were initially pressure injury from a stay in the ICU with COVID. We have been using silver alginate most recently. His dimensions of come in quite dramatically however not recently. We have been putting the left foot in a total contact cast 6/23; complex wounds on the bilateral plantar heels. I been putting the left in the cast paradoxically the area on the right is the one that is going towards closure at a faster rate. Quite a bit of drainage on the left. The patient went to see Dr. Amalia Hailey who said he was going to standby for skin grafts. I had actually considered sending him for skin grafts however he would be mandatorily off his feet for a period of weeks to months. I am thinking that the area on the right is going to close on its own the area on the left has been more stubborn even though we have him in a total contact cast 6/30; took him out of a total contact cast last week is the right heel seem to be making better progress than the left where I  was placing the cast. We are using silver alginate. Both wounds are smaller right greater than left 7/12; both wounds look as though they are making some progress. We are using silver alginate. Heel offloading boots 7/26; very gradual progress especially on the right. Using silver alginate. He is wearing heel offloading boots 8/18; he continues to close these wounds down very gradually. Using  silver alginate. The problem polymen being definitive about this is areas of what appears to be callus around the margins. This is not a 100% of the area but certainly sizable especially on the right 9/1; bilateral plantar feet wounds secondary to prolonged pressure while being ventilated for COVID-19 in an upright position. Essentially pressure ulcers on the bottom of his feet. He is made substantial progress using silver alginate. 9/14; bilateral plantar feet wounds secondary to prolonged pressure. Making progress using silver alginate. 9/29 bilateral plantar feet wounds secondary to prolonged pressure. I changed him to Iodoflex last week. MolecuLight showing reddened blush fluorescence 10/11; patient presents for follow-up. He has no issues or complaints today. He denies signs of infection. He continues to use Iodoflex and antibiotic ointment to the wound beds. 10/27; 2-week follow-up. No evidence of infection. He has callus and thick dry skin around the wound margins we have been using Iodoflex and Bactroban which was in response to a moderate left MolecuLight reddish blush fluorescence. 11/10; 2-week follow-up. Wound margins again have thick callus however the measurements of the actual wound sites are a lot smaller. Everything looks reasonably healthy here. We have been using Iodoflex He was approved for prime matrix but I have elected to delay this given the improvement in the surface area. Hopefully I will not regret that decision as were getting close to the end of the year in terms of insurance payment 12/8; 2-week follow-up. Wounds are generally smaller in size. These were initially substantial wounds extending into the forefoot all the way into the heel on the bilateral plantar feet. They are now both located on the plantar heel distal aspect both of these have a lot of callus around the wounds I used a #5 curette to remove this on the right and the left also some subcutaneous debris to  try and get the wound edges were using Iodoflex. He has heel offloading shoe 12/22; 2-week follow-up. Not really much improvement. He has thick callus around the outer edges of both wounds. I remove this there is some nonviable subcutaneous tissue as well. We have been using Iodoflex. Her intake nurse and myself spontaneously thought of a total contact cast I went back in May. At that time we really were not seeing much of an improvement with a cast although the wound was in a much different situation I would like to retry this in 2 weeks and I discussed this with the patient Objective Constitutional Sitting or standing Blood Pressure is within target range for patient.. Pulse regular and within target range for patient.Marland Kitchen Respirations regular, non-labored and within target range.. Temperature is normal and within the target range for the patient.Marland Kitchen Appears in no distress. Vitals Time Taken: 10:02 AM, Height: 69 in, Source: Stated, Weight: 280 lbs, Source: Stated, BMI: 41.3, Temperature: 97.7 F, Pulse: 80 bpm, Respiratory Rate: 18 breaths/min, Blood Pressure: 144/80 mmHg. General Notes: Wound exam; bilateral plantar heel wounds. These were pressure areas caused by prolonged standing in a ventilatory position in the ICU with COVID-19. He has eschar around both wound edges. This would suggest excessive pressure. I removed both of  these. 2. In 2 weeks I would like to put a total contact cast on the left. Although we did not have such a great experience with that in May of this year the wound is in a different condition Integumentary (Hair, Skin) Wound #1 status is Open. Original cause of wound was Pressure Injury. The date acquired was: 10/07/2019. The wound has been in treatment 54 weeks. The wound is located on the Right Calcaneus. The wound measures 1.6cm length x 0.8cm width x 0.5cm depth; 1.005cm^2 area and 0.503cm^3 volume. There is Fat Layer (Subcutaneous Tissue) exposed. There is no tunneling or  undermining noted. There is a medium amount of serosanguineous drainage noted. The wound margin is thickened. There is large (67-100%) red, pink granulation within the wound bed. There is a small (1-33%) amount of necrotic tissue within the wound bed including Adherent Slough. Wound #2 status is Open. Original cause of wound was Pressure Injury. The date acquired was: 10/07/2019. The wound has been in treatment 54 weeks. The wound is located on the Left Calcaneus. The wound measures 3cm length x 0.9cm width x 0.4cm depth; 2.121cm^2 area and 0.848cm^3 volume. There is Fat Layer (Subcutaneous Tissue) exposed. There is no tunneling or undermining noted. There is a medium amount of serosanguineous drainage noted. The wound margin is thickened. There is large (67-100%) red granulation within the wound bed. There is a small (1-33%) amount of necrotic tissue within the wound bed including Adherent Slough. Assessment Active Problems ICD-10 Non-pressure chronic ulcer of other part of right foot with fat layer exposed Non-pressure chronic ulcer of other 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 Procedures Wound #1 Pre-procedure diagnosis of Wound #1 is a Pressure Ulcer located on the Right Calcaneus . There was a Excisional Skin/Subcutaneous Tissue Debridement with a total area of 1.28 sq cm performed by [name], MD. With the following instrument(s): Curette to remove Viable and Non-Viable tissue/material. Material removed includes Callus, Subcutaneous Tissue, Slough, and Skin: Epidermis after achieving pain control using Lidocaine 4% Topical Solution. No specimens were taken. A time out was conducted at 10:35, prior to the start of the procedure. A Minimum amount of bleeding was controlled with Silver Nitrate. The procedure was tolerated well with a pain level of 2 throughout and a pain level of 3 following the procedure. Post Debridement Measurements: 1.6cm length x 0.8cm width x 0.5cm depth;  0.503cm^3 volume. Post debridement Stage noted as Category/Stage III. Character of Wound/Ulcer Post Debridement requires further debridement. Post procedure Diagnosis Wound #1: Same as Pre-Procedure Wound #2 Pre-procedure diagnosis of Wound #2 is a Pressure Ulcer located on the Left Calcaneus . There was a Excisional Skin/Subcutaneous Tissue Debridement with a total area of 2.7 sq cm performed by [name], MD. With the following instrument(s): Curette to remove Viable and Non-Viable tissue/material. Material removed includes Callus, Subcutaneous Tissue, Slough, and Skin: Epidermis after achieving pain control using Lidocaine 4% T opical Solution. No specimens were taken. A time out was conducted at 10:35, prior to the start of the procedure. A Minimum amount of bleeding was controlled with Silver Nitrate. The procedure was tolerated well with a pain level of 2 throughout and a pain level of 3 following the procedure. Post Debridement Measurements: 3cm length x 0.9cm width x 0.4cm depth; 0.848cm^3 volume. Post debridement Stage noted as Category/Stage III. Character of Wound/Ulcer Post Debridement requires further debridement. Post procedure Diagnosis Wound #2: Same as Pre-Procedure Plan Follow-up Appointments: Return Appointment in 2 weeks. - Dr.  Dellia Nims Other: - plan total contact cast next visit, allow extra time **60 minutes** Bathing/ Shower/ Hygiene: May shower with protection but do not get wound dressing(s) wet. Edema Control - Lymphedema / SCD / Other: Elevate legs to the level of the heart or above for 30 minutes daily and/or when sitting, a frequency of: - throughout the day Avoid standing for long periods of time. Moisturize legs daily. - right leg and foot every night. Off-Loading: Wedge shoe to: - Glophed Shoe to both feet. Other: - keep pressure off of the bottom of your feet Additional Orders / Instructions: Follow Nutritious Diet WOUND #1: - Calcaneus Wound  Laterality: Right Cleanser: Normal Saline (Generic) Every Other Day/30 Days Discharge Instructions: Cleanse the wound with Normal Saline prior to applying a clean dressing using gauze sponges, not tissue or cotton balls. Cleanser: Wound Cleanser Every Other Day/30 Days Discharge Instructions: Cleanse the wound with wound cleanser prior to applying a clean dressing using gauze sponges, not tissue or cotton balls. Prim Dressing: Iodosorb Gel 10 (gm) Tube (Generic) Every Other Day/30 Days ary Discharge Instructions: Apply to wound bed as instructed Secondary Dressing: Woven Gauze Sponge, Non-Sterile 4x4 in (Generic) Every Other Day/30 Days Discharge Instructions: Apply over primary dressing as directed. Secondary Dressing: Zetuvit Plus 4x8 in (Generic) Every Other Day/30 Days Discharge Instructions: Apply over primary dressing as directed. Secured With: Elastic Bandage 4 inch (ACE bandage) (Generic) Every Other Day/30 Days Discharge Instructions: Secure with ACE bandage as directed. Secured With: [employer], 4.5x3.1 (in/yd) (Generic) Every Other Day/30 Days Discharge Instructions: Secure with Kerlix as directed. WOUND #2: - Calcaneus Wound Laterality: Left Cleanser: Normal Saline (Generic) Every Other Day/30 Days Discharge Instructions: Cleanse the wound with Normal Saline prior to applying a clean dressing using gauze sponges, not tissue or cotton balls. Cleanser: Wound Cleanser Every Other Day/30 Days Discharge Instructions: Cleanse the wound with wound cleanser prior to applying a clean dressing using gauze sponges, not tissue or cotton balls. Prim Dressing: Iodosorb Gel 10 (gm) Tube (Generic) Every Other Day/30 Days ary Discharge Instructions: Apply to wound bed as instructed Secondary Dressing: Woven Gauze Sponge, Non-Sterile 4x4 in (Generic) Every Other Day/30 Days Discharge Instructions: Apply over primary dressing as directed. Secondary Dressing: Zetuvit Plus 4x8 in (Generic)  Every Other Day/30 Days Discharge Instructions: Apply over primary dressing as directed. Secured With: Elastic Bandage 4 inch (ACE bandage) (Generic) Every Other Day/30 Days Discharge Instructions: Secure with ACE bandage as directed. Secured With: [employer], 4.5x3.1 (in/yd) (Generic) Every Other Day/30 Days Discharge Instructions: Secure with Kerlix as directed. 1. T contact cast on the left in 2 weeks otal 2. Continued Iodoflex for now. 3. He drives therefore if this works we do have another discussion about the total contact cast on the right 4. MolecuLight before we do this on both sides Electronic Signature(s) Signed: 07/29/2021 3:43:54 PM By: [name] Ham MD Entered By: [name] on 07/29/2021 11:03:36 -------------------------------------------------------------------------------- SuperBill Details Patient Name: Date of Service: [name], [place] 07/29/2021 Medical Record Number: [id number] Patient Account Number: [id number] Date of Birth/Sex: Treating RN: [birth year] (45 y.o. [name] Primary Care Provider: Cristie [name] Other Clinician: Referring Provider: Treating Provider/Extender: [name] in Treatment: 54 Diagnosis Coding ICD-10 Codes Code Description [phone] Non-pressure chronic ulcer of other part of right foot with fat layer exposed L97.522 Non-pressure chronic ulcer of other 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 Facility Procedures CPT4 Code: [id number] Description: [place] TISSUE  20 SQ CM/< ICD-10 Diagnosis Description L97.512 Non-pressure chronic ulcer of other part of right foot with fat layer exposed L97.522 Non-pressure chronic ulcer of other 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 Modifier: Quantity: 1 Physician Procedures : CPT4 Code Description Modifier [id number] - WC PHYS SUBQ TISS 20 SQ CM ICD-10 Diagnosis Description L97.512 Non-pressure chronic ulcer of other part of right foot with  fat layer exposed L97.522 Non-pressure chronic ulcer of other part of left foot  with fat layer exposed Quantity: 1 Electronic Signature(s) Signed: 07/29/2021 3:43:54 PM By: [name] Ham MD Entered By: [name] on 07/29/2021 11:03:46

## 2021-07-29 NOTE — Progress Notes (Signed)
Chart reviewed, agree above plan

## 2021-08-03 NOTE — Progress Notes (Signed)
[name] ([id number]) Visit Report for 07/29/2021 Arrival Information Details Patient Name: Date of Service: [name], [name] 07/29/2021 9:45 A M Medical Record Number: [id number] Patient Account Number: [id number] Date of Birth/Sex: Treating RN: Jul 18, 1976 (45 y.o. [name] Primary Care Aydeen Blume: [name] Other Clinician: Referring Kayn Haymore: Treating Brix Brearley/Extender: [name] in Treatment: 51 Visit Information History Since Last Visit Added or deleted any medications: No Patient Arrived: Wheel Chair Any new allergies or adverse reactions: No Arrival Time: 09:58 Had a fall or experienced change in No Accompanied By: spouse activities of daily living that may affect Transfer Assistance: None risk of falls: Patient Identification Verified: Yes Signs or symptoms of abuse/neglect since last visito No Secondary Verification Process Completed: Yes Hospitalized since last visit: No Patient Requires Transmission-Based Precautions: No Implantable device outside of the clinic excluding No Patient Has Alerts: No cellular tissue based products placed in the center since last visit: Has Dressing in Place as Prescribed: Yes Has Footwear/Offloading in Place as Prescribed: Yes Left: Other:globoped Right: Other:globoped Pain Present Now: Yes Electronic Signature(s) Signed: 07/29/2021 4:56:50 PM By: Baruch Gouty RN, BSN Entered By: Baruch Gouty on 07/29/2021 10:01:59 -------------------------------------------------------------------------------- Encounter Discharge Information Details Patient Name: Date of Service: [name], [name]. 07/29/2021 9:45 A M Medical Record Number: [id number] Patient Account Number: [id number] Date of Birth/Sex: Treating RN: 07-07-1976 (45 y.o. [name] Primary Care Awad Gladd: [name] Other Clinician: Referring Aquinnah Devin: Treating Karna Abed/Extender: [name] in  Treatment: 76 Encounter Discharge Information Items Post Procedure Vitals Discharge Condition: Stable Temperature (F): 97.7 Ambulatory Status: Wheelchair Pulse (bpm): 80 Discharge Destination: Home Respiratory Rate (breaths/min): 18 Transportation: Private Auto Blood Pressure (mmHg): 144/80 Accompanied By: son Schedule Follow-up Appointment: Yes Clinical Summary of Care: Patient Declined Electronic Signature(s) Signed: 07/29/2021 4:56:50 PM By: Baruch Gouty RN, BSN Entered By: Baruch Gouty on 07/29/2021 11:06:21 -------------------------------------------------------------------------------- Lower Extremity Assessment Details Patient Name: Date of Service: [name], [name] 07/29/2021 9:45 A M Medical Record Number: [id number] Patient Account Number: [id number] Date of Birth/Sex: Treating RN: 01/25/1976 (44 y.o. [name] Primary Care Mykaela Arena: [name] Other Clinician: Referring Flonnie Wierman: Treating Dyneshia Baccam/Extender: [name] in Treatment: 54 Edema Assessment Assessed: [name]: No] [name]: No] Edema: [Left: No] [Right: No] Calf Left: Right: Point of Measurement: 29 cm From Medial Instep 42.5 cm 44 cm Ankle Left: Right: Point of Measurement: 9 cm From Medial Instep 25 cm 29 cm Vascular Assessment Pulses: Dorsalis Pedis Palpable: [Left:Yes] [Right:Yes] Electronic Signature(s) Signed: 07/29/2021 4:56:50 PM By: Baruch Gouty RN, BSN Entered By: Baruch Gouty on 07/29/2021 10:13:27 -------------------------------------------------------------------------------- Multi Wound Chart Details Patient Name: Date of Service: [name], [name] 07/29/2021 9:45 A M Medical Record Number: [id number] Patient Account Number: [id number] Date of Birth/Sex: Treating RN: 1975/08/19 (45 y.o. [name] Primary Care Sukari Grist: [name] Other Clinician: Referring Jediah Horger: Treating Terena Bohan/Extender: [name] in Treatment: 31 Vital Signs Height(in): 69 Pulse(bpm): 80 Weight(lbs): 74 Blood Pressure(mmHg): 144/80 Body Mass Index(BMI): 41 Temperature(F): 97.7 Respiratory Rate(breaths/min): 18 Photos: [1:Right Calcaneus] [2:Left Calcaneus] [N/A:N/A N/A] Wound Location: [1:Pressure Injury] [2:Pressure Injury] [N/A:N/A] Wounding Event: [1:Pressure Ulcer] [2:Pressure Ulcer] [N/A:N/A] Primary Etiology: [1:Asthma, Angina, Hypertension] [2:Asthma, Angina, Hypertension] [N/A:N/A] Comorbid History: [1:10/07/2019] [2:10/07/2019] [N/A:N/A] Date Acquired: [1:54] [2:54] [N/A:N/A] Weeks of Treatment: [1:Open] [2:Open] [N/A:N/A] Wound Status: [1:1.6x0.8x0.5] [2:3x0.9x0.4] [N/A:N/A] Measurements L x W x D (cm) [1:1.005] [2:2.121] [N/A:N/A] A (cm) : rea [1:0.503] [2:0.848] [N/A:N/A] Volume (cm) : [  1:96.80%] [2:92.20%] [N/A:N/A] % Reduction in A rea: [1:98.40%] [2:96.90%] [N/A:N/A] % Reduction in Volume: [1:Category/Stage III] [2:Category/Stage III] [N/A:N/A] Classification: [1:Medium] [2:Medium] [N/A:N/A] Exudate A mount: [1:Serosanguineous] [2:Serosanguineous] [N/A:N/A] Exudate Type: [1:red, brown] [2:red, brown] [N/A:N/A] Exudate Color: [1:Thickened] [2:Thickened] [N/A:N/A] Wound Margin: [1:Large (67-100%)] [2:Large (67-100%)] [N/A:N/A] Granulation A mount: [1:Red, Pink] [2:Red] [N/A:N/A] Granulation Quality: [1:Small (1-33%)] [2:Small (1-33%)] [N/A:N/A] Necrotic A mount: [1:Fat Layer (Subcutaneous Tissue): Yes Fat Layer (Subcutaneous Tissue): Yes N/A] Exposed Structures: [1:Fascia: No Tendon: No Muscle: No Joint: No Bone: No Small (1-33%)] [2:Fascia: No Tendon: No Muscle: No Joint: No Bone: No Small (1-33%)] [N/A:N/A] Epithelialization: [1:Debridement - Excisional] [2:Debridement - Excisional] [N/A:N/A] Debridement: Pre-procedure Verification/Time Out 10:35 [2:10:35] [N/A:N/A] Taken: [1:Lidocaine 4% T opical Solution] [2:Lidocaine 4% T opical Solution] [N/A:N/A] Pain Control: [1:Callus,  Subcutaneous, Slough] [2:Callus, Subcutaneous, Slough] [N/A:N/A] Tissue Debrided: [1:Skin/Subcutaneous Tissue] [2:Skin/Subcutaneous Tissue] [N/A:N/A] Level: [1:1.28] [2:2.7] [N/A:N/A] Debridement A (sq cm): [1:rea Curette] [2:Curette] [N/A:N/A] Instrument: [1:Minimum] [2:Minimum] [N/A:N/A] Bleeding: [1:Silver Nitrate] [2:Silver Nitrate] [N/A:N/A] Hemostasis A chieved: [1:2] [2:2] [N/A:N/A] Procedural Pain: [1:3] [2:3] [N/A:N/A] Post Procedural Pain: [1:Procedure was tolerated well] [2:Procedure was tolerated well] [N/A:N/A] Debridement Treatment Response: [1:1.6x0.8x0.5] [2:3x0.9x0.4] [N/A:N/A] Post Debridement Measurements L x W x D (cm) [1:0.503] [2:0.848] [N/A:N/A] Post Debridement Volume: (cm) [1:Category/Stage III] [2:Category/Stage III] [N/A:N/A] Post Debridement Stage: [1:Debridement] [2:Debridement] [N/A:N/A] Treatment Notes Electronic Signature(s) Signed: 07/29/2021 3:43:54 PM By: Linton Ham MD Signed: 07/29/2021 5:08:09 PM By: [name] RN, BSN Entered By: Linton Ham on 07/29/2021 10:54:10 -------------------------------------------------------------------------------- Multi-Disciplinary Care Plan Details Patient Name: Date of Service: [name], [name] 07/29/2021 9:45 A M Medical Record Number: [id number] Patient Account Number: [id number] Date of Birth/Sex: Treating RN: 01-19-1976 (45 y.o. [name] Primary Care Cobi Aldape: [name] Other Clinician: Referring Ellese Julius: Treating Tyion Boylen/Extender: [name] in Treatment: 33 Multidisciplinary Care Plan reviewed with physician Active Inactive Abuse / Safety / Falls / Self Care Management Nursing Diagnoses: Potential for falls Goals: Patient/caregiver will verbalize/demonstrate measures taken to prevent injury and/or falls Date Initiated: 07/15/2020 Target Resolution Date: 08/12/2021 Goal Status: Active Interventions: Assess fall risk on admission and as needed Assess  impairment of mobility on admission and as needed per policy Notes: Wound/Skin Impairment Nursing Diagnoses: Impaired tissue integrity Knowledge deficit related to ulceration/compromised skin integrity Goals: Patient/caregiver will verbalize understanding of skin care regimen Date Initiated: 07/15/2020 Target Resolution Date: 08/12/2021 Goal Status: Active Ulcer/skin breakdown will have a volume reduction of 30% by week 4 Date Initiated: 07/15/2020 Date Inactivated: 08/20/2020 Target Resolution Date: 09/03/2020 Goal Status: Unmet Unmet Reason: no major changes. Ulcer/skin breakdown will heal within 14 weeks Date Initiated: 12/04/2020 Date Inactivated: 12/10/2020 Target Resolution Date: 12/10/2020 Unmet Reason: wounds still open at 14 Goal Status: Unmet weeks and today 21 weeks. Interventions: Assess patient/caregiver ability to obtain necessary supplies Assess patient/caregiver ability to perform ulcer/skin care regimen upon admission and as needed Assess ulceration(s) every visit Provide education on ulcer and skin care Treatment Activities: Skin care regimen initiated : 07/15/2020 Topical wound management initiated : 07/15/2020 Notes: Electronic Signature(s) Signed: 07/29/2021 4:56:50 PM By: Baruch Gouty RN, BSN Entered By: Baruch Gouty on 07/29/2021 10:29:08 -------------------------------------------------------------------------------- Pain Assessment Details Patient Name: Date of Service: [name], [place] E. 07/29/2021 9:45 A M Medical Record Number: [id number] Patient Account Number: [id number] Date of Birth/Sex: Treating RN: 1975-12-04 (45 y.o. [name] Primary Care Quinterious Walraven: [name] Other Clinician: Referring Addilyn Satterwhite: Treating Dakari Stabler/Extender: [name] in Treatment: 96 Active Problems Location of Pain Severity and Description  of Pain Patient Has Paino Yes Site Locations Pain Location: Pain Location: Pain in Ulcers With  Dressing Change: Yes Duration of the Pain. Constant / Intermittento Constant Rate the pain. Current Pain Level: 3 Worst Pain Level: 5 Least Pain Level: 1 Character of Pain Describe the Pain: Aching, Stabbing Pain Management and Medication Current Pain Management: Other: tolerable Is the Current Pain Management Adequate: Adequate How does your wound impact your activities of daily livingo Sleep: No Bathing: No Appetite: No Relationship With Others: No Bladder Continence: No Emotions: No Bowel Continence: No Work: No Toileting: No Drive: No Dressing: No Hobbies: No Electronic Signature(s) Signed: 07/29/2021 4:56:50 PM By: Baruch Gouty RN, BSN Entered By: Baruch Gouty on 07/29/2021 10:03:28 -------------------------------------------------------------------------------- Patient/Caregiver Education Details Patient Name: Date of Service: [name], [place] 12/22/2022andnbsp9:45 A M Medical Record Number: [id number] Patient Account Number: [id number] Date of Birth/Gender: Treating RN: 06-02-1976 (45 y.o. [name] Primary Care Physician: [name] Other Clinician: Referring Physician: Treating Physician/Extender: [name] in Treatment: 29 Education Assessment Education Provided To: Patient Education Topics Provided Offloading: Methods: Explain/Verbal Responses: Reinforcements needed, State content correctly Wound/Skin Impairment: Methods: Explain/Verbal Responses: Reinforcements needed, State content correctly Electronic Signature(s) Signed: 07/29/2021 4:56:50 PM By: Baruch Gouty RN, BSN Signed: 07/29/2021 4:56:50 PM By: Baruch Gouty RN, BSN Entered By: Baruch Gouty on 07/29/2021 10:29:33 -------------------------------------------------------------------------------- Wound Assessment Details Patient Name: Date of Service: [name], [place] 07/29/2021 9:45 A M Medical Record Number: [id number] Patient Account  Number: [id number] Date of Birth/Sex: Treating RN: Aug 03, 1976 (45 y.o. [name] Primary Care Janeece Blok: [name] Other Clinician: Referring Swara Donze: Treating Lauri Purdum/Extender: [name] in Treatment: 46 Wound Status Wound Number: 1 Primary Etiology: Pressure Ulcer Wound Location: Right Calcaneus Wound Status: Open Wounding Event: Pressure Injury Comorbid History: Asthma, Angina, Hypertension Date Acquired: 10/07/2019 Weeks Of Treatment: 54 Clustered Wound: No Photos Wound Measurements Length: (cm) 1.6 Width: (cm) 0.8 Depth: (cm) 0.5 Area: (cm) 1.005 Volume: (cm) 0.503 % Reduction in Area: 96.8% % Reduction in Volume: 98.4% Epithelialization: Small (1-33%) Tunneling: No Undermining: No Wound Description Classification: Category/Stage III Wound Margin: Thickened Exudate Amount: Medium Exudate Type: Serosanguineous Exudate Color: red, brown Foul Odor After Cleansing: No Slough/Fibrino No Wound Bed Granulation Amount: Large (67-100%) Exposed Structure Granulation Quality: Red, Pink Fascia Exposed: No Necrotic Amount: Small (1-33%) Fat Layer (Subcutaneous Tissue) Exposed: Yes Necrotic Quality: Adherent Slough Tendon Exposed: No Muscle Exposed: No Joint Exposed: No Bone Exposed: No Treatment Notes Wound #1 (Calcaneus) Wound Laterality: Right Cleanser Normal Saline Discharge Instruction: Cleanse the wound with Normal Saline prior to applying a clean dressing using gauze sponges, not tissue or cotton balls. Wound Cleanser Discharge Instruction: Cleanse the wound with wound cleanser prior to applying a clean dressing using gauze sponges, not tissue or cotton balls. Peri-Wound Care Topical Primary Dressing Iodosorb Gel 10 (gm) Tube Discharge Instruction: Apply to wound bed as instructed Secondary Dressing Woven Gauze Sponge, Non-Sterile 4x4 in Discharge Instruction: Apply over primary dressing as directed. Zetuvit Plus 4x8  in Discharge Instruction: Apply over primary dressing as directed. Secured With Elastic Bandage 4 inch (ACE bandage) Discharge Instruction: Secure with ACE bandage as directed. Kerlix Roll Sterile, 4.5x3.1 (in/yd) Discharge Instruction: Secure with Kerlix as directed. Compression Wrap Compression Stockings Add-Ons Electronic Signature(s) Signed: 07/29/2021 4:56:50 PM By: Baruch Gouty RN, BSN Signed: 08/03/2021 1:16:02 PM By: [name] RN Entered By: [name] on 07/29/2021 10:25:14 -------------------------------------------------------------------------------- Wound Assessment Details Patient Name: Date of Service: [name],  [name] 07/29/2021 9:45 A M Medical Record Number: [id number] Patient Account Number: [id number] Date of Birth/Sex: Treating RN: 14-Jun-1976 (45 y.o. [name] Primary Care Noreta Kue: [name] Other Clinician: Referring Oviya Ammar: Treating Javae Braaten/Extender: [name] in Treatment: 21 Wound Status Wound Number: 2 Primary Etiology: Pressure Ulcer Wound Location: Left Calcaneus Wound Status: Open Wounding Event: Pressure Injury Comorbid History: Asthma, Angina, Hypertension Date Acquired: 10/07/2019 Weeks Of Treatment: 54 Clustered Wound: No Photos Wound Measurements Length: (cm) 3 Width: (cm) 0.9 Depth: (cm) 0.4 Area: (cm) 2.121 Volume: (cm) 0.848 % Reduction in Area: 92.2% % Reduction in Volume: 96.9% Epithelialization: Small (1-33%) Tunneling: No Undermining: No Wound Description Classification: Category/Stage III Wound Margin: Thickened Exudate Amount: Medium Exudate Type: Serosanguineous Exudate Color: red, brown Foul Odor After Cleansing: No Slough/Fibrino No Wound Bed Granulation Amount: Large (67-100%) Exposed Structure Granulation Quality: Red Fascia Exposed: No Necrotic Amount: Small (1-33%) Fat Layer (Subcutaneous Tissue) Exposed: Yes Necrotic Quality: Adherent  Slough Tendon Exposed: No Muscle Exposed: No Joint Exposed: No Bone Exposed: No Treatment Notes Wound #2 (Calcaneus) Wound Laterality: Left Cleanser Normal Saline Discharge Instruction: Cleanse the wound with Normal Saline prior to applying a clean dressing using gauze sponges, not tissue or cotton balls. Wound Cleanser Discharge Instruction: Cleanse the wound with wound cleanser prior to applying a clean dressing using gauze sponges, not tissue or cotton balls. Peri-Wound Care Topical Primary Dressing Iodosorb Gel 10 (gm) Tube Discharge Instruction: Apply to wound bed as instructed Secondary Dressing Woven Gauze Sponge, Non-Sterile 4x4 in Discharge Instruction: Apply over primary dressing as directed. Zetuvit Plus 4x8 in Discharge Instruction: Apply over primary dressing as directed. Secured With Elastic Bandage 4 inch (ACE bandage) Discharge Instruction: Secure with ACE bandage as directed. Kerlix Roll Sterile, 4.5x3.1 (in/yd) Discharge Instruction: Secure with Kerlix as directed. Compression Wrap Compression Stockings Add-Ons Electronic Signature(s) Signed: 07/29/2021 4:56:50 PM By: Baruch Gouty RN, BSN Signed: 08/03/2021 1:16:02 PM By: [name] RN Entered By: [name] on 07/29/2021 10:25:56 -------------------------------------------------------------------------------- Vitals Details Patient Name: Date of Service: [name], [place] 07/29/2021 9:45 A M Medical Record Number: [id number] Patient Account Number: [id number] Date of Birth/Sex: Treating RN: [birth year] (45 y.o. [name] Primary Care Laurens Matheny: [name] Other Clinician: Referring Damian Buckles: Treating Leatrice Parilla/Extender: [name] in Treatment: 54 Vital Signs Time Taken: 10:02 Temperature (F): 97.7 Height (in): 69 Pulse (bpm): 80 Source: Stated Respiratory Rate (breaths/min): 18 Weight (lbs): 280 Blood Pressure (mmHg): 144/80 Source:  Stated Reference Range: 80 - 120 mg / dl Body Mass Index (BMI): 41.3 Electronic Signature(s) Signed: 07/29/2021 4:56:50 PM By: Baruch Gouty RN, BSN Entered By: Baruch Gouty on 07/29/2021 10:02:34

## 2021-08-09 DIAGNOSIS — G4734 Idiopathic sleep related nonobstructive alveolar hypoventilation: Secondary | ICD-10-CM | POA: Diagnosis not present

## 2021-08-09 DIAGNOSIS — U071 COVID-19: Secondary | ICD-10-CM | POA: Diagnosis not present

## 2021-08-12 ENCOUNTER — Other Ambulatory Visit: Payer: Self-pay

## 2021-08-12 ENCOUNTER — Encounter (HOSPITAL_BASED_OUTPATIENT_CLINIC_OR_DEPARTMENT_OTHER): Payer: BC Managed Care – PPO | Attending: Internal Medicine | Admitting: Internal Medicine

## 2021-08-12 DIAGNOSIS — I10 Essential (primary) hypertension: Secondary | ICD-10-CM | POA: Diagnosis not present

## 2021-08-12 DIAGNOSIS — L97522 Non-pressure chronic ulcer of other 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: Secondary | ICD-10-CM | POA: Diagnosis not present

## 2021-08-12 DIAGNOSIS — L97512 Non-pressure chronic ulcer of other part of right foot with fat layer exposed: Secondary | ICD-10-CM | POA: Insufficient documentation

## 2021-08-12 DIAGNOSIS — Z8616 Personal history of COVID-19: Secondary | ICD-10-CM | POA: Insufficient documentation

## 2021-08-12 DIAGNOSIS — L89613 Pressure ulcer of right heel, stage 3: Secondary | ICD-10-CM | POA: Diagnosis not present

## 2021-08-13 ENCOUNTER — Other Ambulatory Visit: Payer: Self-pay | Admitting: Behavioral Health

## 2021-08-13 DIAGNOSIS — S91301A Unspecified open wound, right foot, initial encounter: Secondary | ICD-10-CM | POA: Diagnosis not present

## 2021-08-13 DIAGNOSIS — F331 Major depressive disorder, recurrent, moderate: Secondary | ICD-10-CM

## 2021-08-13 DIAGNOSIS — F411 Generalized anxiety disorder: Secondary | ICD-10-CM

## 2021-08-13 DIAGNOSIS — F4321 Adjustment disorder with depressed mood: Secondary | ICD-10-CM

## 2021-08-13 NOTE — Progress Notes (Addendum)
[name] ([id number]) Visit Report for 08/12/2021 Debridement Details Patient Name: Date of Service: [name], [place] 08/12/2021 9:45 A M Medical Record Number: [id number] Patient Account Number: [id number] Date of Birth/Sex: Treating RN: 1975/10/27 (46 y.o. [name], [name] Primary Care Provider: [name] Other Clinician: Referring Provider: Treating Provider/Extender: [name] in Treatment: 56 Debridement Performed for Assessment: Wound #1 Right Calcaneus Performed By: Physician Ricard Dillon., MD Debridement Type: Debridement Level of Consciousness (Pre-procedure): Awake and Alert Pre-procedure Verification/Time Out Yes - 11:00 Taken: Start Time: 11:00 Pain Control: Lidocaine T Area Debrided (L x W): otal 2 (cm) x 1.4 (cm) = 2.8 (cm) Tissue and other material debrided: Viable, Non-Viable, Callus, Slough, Subcutaneous, Skin: Dermis , Skin: Epidermis, Slough Level: Skin/Subcutaneous Tissue Debridement Description: Excisional Instrument: Curette Bleeding: Minimum Hemostasis Achieved: Pressure End Time: 11:00 Procedural Pain: 0 Post Procedural Pain: 0 Response to Treatment: Procedure was tolerated well Level of Consciousness (Post- Awake and Alert procedure): Post Debridement Measurements of Total Wound Length: (cm) 2 Stage: Category/Stage III Width: (cm) 1.4 Depth: (cm) 0.5 Volume: (cm) 1.1 Character of Wound/Ulcer Post Debridement: Improved Post Procedure Diagnosis Same as Pre-procedure Electronic Signature(s) Signed: 08/12/2021 4:38:12 PM By: Linton Ham MD Signed: 08/13/2021 11:40:24 AM By: [name] RN Entered By: [name] on 08/12/2021 11:06:44 -------------------------------------------------------------------------------- HPI Details Patient Name: Date of Service: [name], [place]. 08/12/2021 9:45 A M Medical Record Number: [id number] Patient Account Number: [id number] Date of Birth/Sex: Treating  RN: [birth year] (46 y.o. [name] Primary Care Provider: [name] Other Clinician: Referring Provider: Treating Provider/Extender: [name] in Treatment: 5 History of Present Illness HPI Description: Wounds are12/03/2020 upon evaluation today patient presents for initial inspection here in our clinic concerning issues he has been having with the bottoms of his feet bilaterally. He states these actually occurred as wounds when he was hospitalized for 5 months secondary to Covid. He was apparently with tilting bed where he was in an upright position quite frequently and apparently this occurred in some way shape or form during that time. Fortunately there is no sign of active infection at this time. No fevers, chills, nausea, vomiting, or diarrhea. With that being said he still has substantial wounds on the plantar aspects of his feet Theragen require quite a bit of work to get these to heal. He has been using Santyl currently though that is been problematic both in receiving the medication as well as actually paid for it as it is become quite expensive. Prior to the experience with Covid the patient really did not have any major medical problems other than hypertension he does have some mild generalized weakness following the Covid experience. 07/22/2020 on evaluation today patient appears to be doing okay in regard to his foot ulcers I feel like the wound beds are showing signs of better improvement that I do believe the Iodoflex is helping in this regard. With that being said he does have a lot of drainage currently and this is somewhat blue/green in nature which is consistent with Pseudomonas. I do think a culture today would be appropriate for [place] to evaluate and see if that is indeed the case I would likely start him on antibiotic orally as well he is not allergic to Cipro knows of no issues he has had in the past 12/21; patient was admitted to the clinic  earlier this month with bilateral presumed pressure ulcers on the bottom of his feet apparently related  to excessive pressure from a tilt table arrangement in the intensive care unit. Patient relates this to being on ECMO but I am not really sure that is exactly related to that. I must say I have never seen anything like this. He has fairly extensive full-thickness wounds extending from his heel towards his midfoot mostly centered laterally. There is already been some healing distally. He does not appear to have an arterial issue. He has been using gentamicin to the wound surfaces with Iodoflex to help with ongoing debridement 1/6; this is a patient with pressure ulcers on the bottom of his feet related to excessive pressure from a standing position in the intensive care unit. He is complaining of a lot of pain in the right heel. He is not a diabetic. 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 We have been using Iodoflex to help prepare the surfaces of both wounds for an advanced treatment product. He is nonambulatory spending most of his time in a wheelchair I have asked him not to propel the wheelchair with his heels 1/13; in general his wounds look better not much surface area change we have been using Iodoflex as of last week. I did an x-ray of the right heel as the patient was complaining of pain. I had some thoughts about a stress fracture perhaps Achilles tendon problems however what it showed was erosive changes along the inferior aspect of the calcaneus he now has a MRI booked for 1/20. 1/20; in general his wounds continue to be better. Some improvement in the large narrow areas proximally in his foot. He is still complaining of pain in the right heel and tenderness in certain areas of this wound. His MRI is tonight. I am not just looking for osteomyelitis that was brought up on the x-ray I am wondering about stress fractures, tendon ruptures etc. He has no such findings  on the left. Also noteworthy is that the patient had critical illness neuropathy and some of the discomfort may be actual improvement in nerve function I am just not sure. These wounds were initially in the setting of severe critical illness related to COVID-19. He was put in a standing position. He may have also been on pressors at the point contributing to tissue ischemia. By his description at some point these wounds were grossly necrotic extending proximally up into the Achilles part of his heel. I do not know that I have ever really seen pictures of them like this although they may exist in epic We have ordered Tri layer Oasis. I am trying to stimulate some granulation in these areas. This is of course assuming the MRI is negative for infection 1/27; since the patient was last here he saw Dr. Juleen China of infectious disease. He is planned for vancomycin and ceftriaxone. Prior operative culture grew MSSA. Also ordered baseline lab work. He also ordered arterial studies although the ABIs in our clinic were normal as well as his clinical exam these were normal I do not think he needs to see vascular surgery. His ABIs at the PTA were 1.22 in the right triphasic waveforms with a normal TBI of 1.15 on the left ABI of 1.22 with triphasic waveforms and a normal TBI of 1.08. Finally he saw Dr. Amalia Hailey who will follow him in for 2 months. At this point I do not think he felt that he needed a procedure on the right calcaneal bone. Dr. Juleen China is elected for broad-spectrum antibiotic The patient is still having  pain in the right heel. He walks with a walker 2/3; wounds are generally smaller. He is tolerating his IV antibiotics. I believe this is vancomycin and ceftriaxone. We are still waiting for Oasis burn in terms of his out-of-pocket max which he should be meeting soon given the IV antibiotics, MRIs etc. I have asked him to check in on this. We are using silver collagen in the meantime the wounds look  better 2/10; tolerating IV vancomycin and Rocephin. We are waiting to apply for Oasis. Although I am not really sure where he is in his out-of-pocket max. 2/17 started the first application of Oasis trilayer. Still on antibiotics. The wounds have generally look better. The area on the left has a little more surface slough requiring debridement 2/11; second application of Oasis trilayer. The wound surface granulation is generally look better. The area on the left with undermining laterally I think is come in a bit. 10/08/2020 upon evaluation today patient is here today for [employer] application #3. Fortunately he seems to be doing extremely well with regard to this and we are seeing a lot of new epithelial growth which is great news. Fortunately there is no signs of active infection at this time. 10/16/2020 upon evaluation today patient appears to be doing well with regard to his foot ulcers. Do believe the Oasis has been of benefit for him. I do not see any signs of infection right now which is great news and I think that he has a lot of new epithelial growth which is great to see as well. The patient is very pleased to hear all of this. I do think we can proceed with the Oasis trilayer #4 today. 3/18; not as much improvement in these areas on his heels that I was hoping. I did reapply trilateral Oasis today the tissue looks healthier but not as much fill in as I was hoping. 3/25; better looking today I think this is come in a bit the tissue looks healthier. Triple layer Oasis reapplied #6 4/1; somewhat better looking definitely better looking surface not as much change in surface area as I was hoping. He may be spending more time [place] on days then he needs to although he does have heel offloading boots. Triple layer Oasis reapplied #7 4/7; unfortunately apparently [facility] will not approve any further Oasis which is unfortunate since the patient did respond nicely both in terms of  the condition of the wound bed as well as surface area. There is still some drainage coming from the wound but not a lot there does not appear to be any infection 4/15; we have been using Hydrofera Blue. He continues to have nice rims of epithelialization on the right greater than the left. The left the epithelialization is coming from the tip of his heel. There is moderate drainage. In this that concerns me about a total contact cast. There is no evidence of infection 4/29; patient has been using Hydrofera Blue with dressing changes. He has no complaints or issues today. 5/5; using Hydrofera Blue. I actually think that he looks marginally better than the last time I saw this 3 weeks ago. There are rims of epithelialization on the left thumb coming from the medial side on the right. Using Hydrofera Blue 5/12; using Hydrofera Blue. These continue to make improvements in surface area. His drainage was not listed as severe I therefore went ahead and put a cast on the left foot. Right foot we will continue to  dress his previous 5/16; back for first total contact cast change. He did not tolerate this particularly well cast injury on the anterior tibia among other issues. Difficulty sleeping. I talked him about this in some detail and afterwards is elected to continue. I told him I would like to have a cast on for 3 weeks to see if this is going to help at all. I think he agreed 5/19; I think the wound is better. There is no tunneling towards his midfoot. The undermining medially also looks better. He has a rim of new skin distally. I think we are making progress here. The area on the left also continues to look somewhat better to me using Hydrofera Blue. He has a list of complaints about the cast but none of them seem serious 5/26; patient presents for 1 week follow-up. He has been using a total contact cast and tolerating this well. Hydrofera Blue is the main dressing used. He denies signs of  infection. 6/2 Hydrofera Blue total contact cast on the left. These were large ulcers that formed in intensive care unit where the patient was recovering from [place]. May have had something to do with being ventilated in an upright positiono Pressors etc. We have been able to get the areas down considerably and a viable surface. There is some epithelialization in both sides. Note made of drainage 6/9; changed to [facility] last time because of drainage. He arrives with better looking surfaces and dimensions on the left than the right. Paradoxically the right actually probes more towards his midfoot the left is largely close down but both of these look improved. Using a total contact cast on the left 6/16; complex wounds on his bilateral plantar heels which were initially pressure injury from a stay in the ICU with COVID. We have been using silver alginate most recently. His dimensions of come in quite dramatically however not recently. We have been putting the left foot in a total contact cast 6/23; complex wounds on the bilateral plantar heels. I been putting the left in the cast paradoxically the area on the right is the one that is going towards closure at a faster rate. Quite a bit of drainage on the left. The patient went to see Dr. Amalia Hailey who said he was going to standby for skin grafts. I had actually considered sending him for skin grafts however he would be mandatorily off his feet for a period of weeks to months. I am thinking that the area on the right is going to close on its own the area on the left has been more stubborn even though we have him in a total contact cast 6/30; took him out of a total contact cast last week is the right heel seem to be making better progress than the left where I was placing the cast. We are using silver alginate. Both wounds are smaller right greater than left 7/12; both wounds look as though they are making some progress. We are using silver alginate.  Heel offloading boots 7/26; very gradual progress especially on the right. Using silver alginate. He is wearing heel offloading boots 8/18; he continues to close these wounds down very gradually. Using silver alginate. The problem polymen being definitive about this is areas of what appears to be callus around the margins. This is not a 100% of the area but certainly sizable especially on the right 9/1; bilateral plantar feet wounds secondary to prolonged pressure while being ventilated for COVID-19 in an upright  position. Essentially pressure ulcers on the bottom of his feet. He is made substantial progress using silver alginate. 9/14; bilateral plantar feet wounds secondary to prolonged pressure. Making progress using silver alginate. 9/29 bilateral plantar feet wounds secondary to prolonged pressure. I changed him to Iodoflex last week. MolecuLight showing reddened blush fluorescence 10/11; patient presents for follow-up. He has no issues or complaints today. He denies signs of infection. He continues to use Iodoflex and antibiotic ointment to the wound beds. 10/27; 2-week follow-up. No evidence of infection. He has callus and thick dry skin around the wound margins we have been using Iodoflex and Bactroban which was in response to a moderate left MolecuLight reddish blush fluorescence. 11/10; 2-week follow-up. Wound margins again have thick callus however the measurements of the actual wound sites are a lot smaller. Everything looks reasonably healthy here. We have been using Iodoflex He was approved for prime matrix but I have elected to delay this given the improvement in the surface area. Hopefully I will not regret that decision as were getting close to the end of the year in terms of insurance payment 12/8; 2-week follow-up. Wounds are generally smaller in size. These were initially substantial wounds extending into the forefoot all the way into the heel on the bilateral plantar feet. They  are now both located on the plantar heel distal aspect both of these have a lot of callus around the wounds I used a #5 curette to remove this on the right and the left also some subcutaneous debris to try and get the wound edges were using Iodoflex. He has heel offloading shoe 12/22; 2-week follow-up. Not really much improvement. He has thick callus around the outer edges of both wounds. I remove this there is some nonviable subcutaneous tissue as well. We have been using Iodoflex. Her intake nurse and myself spontaneously thought of a total contact cast I went back in May. At that time we really were not seeing much of an improvement with a cast although the wound was in a much different situation I would like to retry this in 2 weeks and I discussed this with the patient 08/12/2021; the patient has had some improvement with the Iodoflex. The the area on the left heel plantar more improved than the right. I had to put him in a total contact cast on the left although I decided to put that off for 2 weeks. I am going to change his primary dressing to silver collagen. I think in both areas he has had some improvement most of the healing seems to be more proximal in the heel. The wounds are in the mid aspect. A lot of thick callus on the right heel however. Electronic Signature(s) Signed: 08/12/2021 4:38:12 PM By: Linton Ham MD Entered By: Linton Ham on 08/12/2021 11:02:57 -------------------------------------------------------------------------------- Physical Exam Details Patient Name: Date of Service: [name], [place] 08/12/2021 9:45 A M Medical Record Number: [id number] Patient Account Number: [id number] Date of Birth/Sex: Treating RN: 07/29/1976 (46 y.o. [name] Primary Care Provider: [name] Other Clinician: Referring Provider: Treating Provider/Extender: [name] in Treatment: 30 Constitutional Patient is hypertensive.. Pulse regular and within  target range for patient.Marland Kitchen Respirations regular, non-labored and within target range.. Temperature is normal and within the target range for the patient.Marland Kitchen Appears in no distress. Notes Wound exam; bilateral plantar heel wounds. The patient has wounds in the mid aspect of the original wound areas. On the left this is roughly 2.5 x  1 it has depth but no undermining. On the right a lot of thick callus around the margins that I removed with a #5 curette into subcutaneous tissue.I see no evidence of infection Electronic Signature(s) Signed: 08/12/2021 4:38:12 PM By: Linton Ham MD Entered By: Linton Ham on 08/12/2021 11:04:12 -------------------------------------------------------------------------------- Physician Orders Details Patient Name: Date of Service: [name], [place] 08/12/2021 9:45 A M Medical Record Number: [id number] Patient Account Number: [id number] Date of Birth/Sex: Treating RN: Oct 03, 1975 (46 y.o. [name] Primary Care Provider: [name] Other Clinician: Referring Provider: Treating Provider/Extender: [name] in Treatment: 67 Verbal / Phone Orders: No Diagnosis Coding Follow-up Appointments ppointment in 2 weeks. - Dr. Dellia Nims Return A Bathing/ Shower/ Hygiene May shower with protection but do not get wound dressing(s) wet. Edema Control - Lymphedema / SCD / Other Bilateral Lower Extremities Elevate legs to the level of the heart or above for 30 minutes daily and/or when sitting, a frequency of: - throughout the day Avoid standing for long periods of time. Moisturize legs daily. - right leg and foot every night. Off-Loading Wedge shoe to: - Glophed Shoe to both feet. Other: - keep pressure off of the bottom of your feet Additional Orders / Instructions Follow Nutritious Diet Wound Treatment Wound #1 - Calcaneus Wound Laterality: Right Cleanser: Normal Saline (Generic) Every Other Day/30 Days Discharge Instructions:  Cleanse the wound with Normal Saline prior to applying a clean dressing using gauze sponges, not tissue or cotton balls. Cleanser: Wound Cleanser Every Other Day/30 Days Discharge Instructions: Cleanse the wound with wound cleanser prior to applying a clean dressing using gauze sponges, not tissue or cotton balls. Prim Dressing: Promogran Prisma Matrix, 4.34 (sq in) (silver collagen) (DME) (Generic) Every Other Day/30 Days ary Discharge Instructions: Moisten collagen with saline or hydrogel Secondary Dressing: Woven Gauze Sponge, Non-Sterile 4x4 in (Generic) Every Other Day/30 Days Discharge Instructions: Apply over primary dressing as directed. Secondary Dressing: Zetuvit Plus 4x8 in (Generic) Every Other Day/30 Days Discharge Instructions: Apply over primary dressing as directed. Secured With: Elastic Bandage 4 inch (ACE bandage) (Generic) Every Other Day/30 Days Discharge Instructions: Secure with ACE bandage as directed. Secured With: [employer], 4.5x3.1 (in/yd) (Generic) Every Other Day/30 Days Discharge Instructions: Secure with Kerlix as directed. Wound #2 - Calcaneus Wound Laterality: Left Cleanser: Normal Saline (Generic) Every Other Day/30 Days Discharge Instructions: Cleanse the wound with Normal Saline prior to applying a clean dressing using gauze sponges, not tissue or cotton balls. Cleanser: Wound Cleanser Every Other Day/30 Days Discharge Instructions: Cleanse the wound with wound cleanser prior to applying a clean dressing using gauze sponges, not tissue or cotton balls. Prim Dressing: Promogran Prisma Matrix, 4.34 (sq in) (silver collagen) (DME) (Generic) Every Other Day/30 Days ary Discharge Instructions: Moisten collagen with saline or hydrogel Secondary Dressing: Woven Gauze Sponge, Non-Sterile 4x4 in (Generic) Every Other Day/30 Days Discharge Instructions: Apply over primary dressing as directed. Secondary Dressing: Zetuvit Plus 4x8 in (Generic) Every Other  Day/30 Days Discharge Instructions: Apply over primary dressing as directed. Secured With: Elastic Bandage 4 inch (ACE bandage) (Generic) Every Other Day/30 Days Discharge Instructions: Secure with ACE bandage as directed. Secured With: [employer], 4.5x3.1 (in/yd) (Generic) Every Other Day/30 Days Discharge Instructions: Secure with Kerlix as directed. Electronic Signature(s) Signed: 08/12/2021 4:38:12 PM By: Linton Ham MD Signed: 08/13/2021 11:40:24 AM By: [name] RN Entered By: [name] on 08/12/2021 10:58:42 -------------------------------------------------------------------------------- Problem List Details Patient Name: Date of Service: [name],  [name] 08/12/2021 9:45 A M Medical Record Number: [id number] Patient Account Number: [id number] Date of Birth/Sex: Treating RN: 05/09/1976 (46 y.o. [name] Primary Care Provider: [name] Other Clinician: Referring Provider: Treating Provider/Extender: [name] in Treatment: 56 Active Problems ICD-10 Encounter Code Description Active Date MDM Diagnosis [phone] Non-pressure chronic ulcer of other part of right foot with fat layer exposed 09/03/2020 No Yes L97.522 Non-pressure chronic ulcer of other 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 09/03/2020 No Yes Inactive Problems ICD-10 Code Description Active Date Inactive Date L89.893 Pressure ulcer of other site, stage 3 07/15/2020 07/15/2020 M62.81 Muscle weakness (generalized) 07/15/2020 07/15/2020 I10 Essential (primary) hypertension 07/15/2020 07/15/2020 M86.171 Other acute osteomyelitis, right ankle and foot 09/03/2020 09/03/2020 Resolved Problems Electronic Signature(s) Signed: 08/12/2021 4:38:12 PM By: Linton Ham MD Entered By: Linton Ham on 08/12/2021 11:01:15 -------------------------------------------------------------------------------- Progress Note Details Patient Name: Date of Service: [name], [place] 08/12/2021 9:45 A M Medical Record Number: [id number] Patient Account Number: [id number] Date of Birth/Sex: Treating RN: 1975-09-14 (46 y.o. [name] Primary Care Provider: [name] Other Clinician: Referring Provider: Treating Provider/Extender: [name] in Treatment: 68 Subjective History of Present Illness (HPI) Wounds are12/03/2020 upon evaluation today patient presents for initial inspection here in our clinic concerning issues he has been having with the bottoms of his feet bilaterally. He states these actually occurred as wounds when he was hospitalized for 5 months secondary to Covid. He was apparently with tilting bed where he was in an upright position quite frequently and apparently this occurred in some way shape or form during that time. Fortunately there is no sign of active infection at this time. No fevers, chills, nausea, vomiting, or diarrhea. With that being said he still has substantial wounds on the plantar aspects of his feet Theragen require quite a bit of work to get these to heal. He has been using Santyl currently though that is been problematic both in receiving the medication as well as actually paid for it as it is become quite expensive. Prior to the experience with Covid the patient really did not have any major medical problems other than hypertension he does have some mild generalized weakness following the Covid experience. 07/22/2020 on evaluation today patient appears to be doing okay in regard to his foot ulcers I feel like the wound beds are showing signs of better improvement that I do believe the Iodoflex is helping in this regard. With that being said he does have a lot of drainage currently and this is somewhat blue/green in nature which is consistent with Pseudomonas. I do think a culture today would be appropriate for [place] to evaluate and see if that is indeed the case I would likely start him on antibiotic orally as  well he is not allergic to Cipro knows of no issues he has had in the past 12/21; patient was admitted to the clinic earlier this month with bilateral presumed pressure ulcers on the bottom of his feet apparently related to excessive pressure from a tilt table arrangement in the intensive care unit. Patient relates this to being on ECMO but I am not really sure that is exactly related to that. I must say I have never seen anything like this. He has fairly extensive full-thickness wounds extending from his heel towards his midfoot mostly centered laterally. There is already been some healing distally. He does not appear to have an arterial issue. He has been  using gentamicin to the wound surfaces with Iodoflex to help with ongoing debridement 1/6; this is a patient with pressure ulcers on the bottom of his feet related to excessive pressure from a standing position in the intensive care unit. He is complaining of a lot of pain in the right heel. He is not a diabetic. 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 We have been using Iodoflex to help prepare the surfaces of both wounds for an advanced treatment product. He is nonambulatory spending most of his time in a wheelchair I have asked him not to propel the wheelchair with his heels 1/13; in general his wounds look better not much surface area change we have been using Iodoflex as of last week. I did an x-ray of the right heel as the patient was complaining of pain. I had some thoughts about a stress fracture perhaps Achilles tendon problems however what it showed was erosive changes along the inferior aspect of the calcaneus he now has a MRI booked for 1/20. 1/20; in general his wounds continue to be better. Some improvement in the large narrow areas proximally in his foot. He is still complaining of pain in the right heel and tenderness in certain areas of this wound. His MRI is tonight. I am not just looking for osteomyelitis  that was brought up on the x-ray I am wondering about stress fractures, tendon ruptures etc. He has no such findings on the left. Also noteworthy is that the patient had critical illness neuropathy and some of the discomfort may be actual improvement in nerve function I am just not sure. These wounds were initially in the setting of severe critical illness related to COVID-19. He was put in a standing position. He may have also been on pressors at the point contributing to tissue ischemia. By his description at some point these wounds were grossly necrotic extending proximally up into the Achilles part of his heel. I do not know that I have ever really seen pictures of them like this although they may exist in epic We have ordered Tri layer Oasis. I am trying to stimulate some granulation in these areas. This is of course assuming the MRI is negative for infection 1/27; since the patient was last here he saw Dr. Juleen China of infectious disease. He is planned for vancomycin and ceftriaxone. Prior operative culture grew MSSA. Also ordered baseline lab work. He also ordered arterial studies although the ABIs in our clinic were normal as well as his clinical exam these were normal I do not think he needs to see vascular surgery. His ABIs at the [facility] were 1.22 in the right triphasic waveforms with a normal TBI of 1.15 on the left ABI of 1.22 with triphasic waveforms and a normal TBI of 1.08. Finally he saw Dr. Amalia Hailey who will follow him in for 2 months. At this point I do not think he felt that he needed a procedure on the right calcaneal bone. Dr. Juleen China is elected for broad-spectrum antibiotic The patient is still having pain in the right heel. He walks with a walker 2/3; wounds are generally smaller. He is tolerating his IV antibiotics. I believe this is vancomycin and ceftriaxone. We are still waiting for Oasis burn in terms of his out-of-pocket max which he should be meeting soon given the IV  antibiotics, MRIs etc. I have asked him to check in on this. We are using silver collagen in the meantime the wounds look better 2/10; tolerating  IV vancomycin and Rocephin. We are waiting to apply for Oasis. Although I am not really sure where he is in his out-of-pocket max. 2/17 started the first application of Oasis trilayer. Still on antibiotics. The wounds have generally look better. The area on the left has a little more surface slough requiring debridement 7/67; second application of Oasis trilayer. The wound surface granulation is generally look better. The area on the left with undermining laterally I think is come in a bit. 10/08/2020 upon evaluation today patient is here today for [employer] application #3. Fortunately he seems to be doing extremely well with regard to this and we are seeing a lot of new epithelial growth which is great news. Fortunately there is no signs of active infection at this time. 10/16/2020 upon evaluation today patient appears to be doing well with regard to his foot ulcers. Do believe the Oasis has been of benefit for him. I do not see any signs of infection right now which is great news and I think that he has a lot of new epithelial growth which is great to see as well. The patient is very pleased to hear all of this. I do think we can proceed with the Oasis trilayer #4 today. 3/18; not as much improvement in these areas on his heels that I was hoping. I did reapply trilateral Oasis today the tissue looks healthier but not as much fill in as I was hoping. 3/25; better looking today I think this is come in a bit the tissue looks healthier. Triple layer Oasis reapplied #6 4/1; somewhat better looking definitely better looking surface not as much change in surface area as I was hoping. He may be spending more time [place] on days then he needs to although he does have heel offloading boots. Triple layer Oasis reapplied #7 4/7; unfortunately apparently [facility] will not approve any further Oasis which is unfortunate since the patient did respond nicely both in terms of the condition of the wound bed as well as surface area. There is still some drainage coming from the wound but not a lot there does not appear to be any infection 4/15; we have been using Hydrofera Blue. He continues to have nice rims of epithelialization on the right greater than the left. The left the epithelialization is coming from the tip of his heel. There is moderate drainage. In this that concerns me about a total contact cast. There is no evidence of infection 4/29; patient has been using Hydrofera Blue with dressing changes. He has no complaints or issues today. 5/5; using Hydrofera Blue. I actually think that he looks marginally better than the last time I saw this 3 weeks ago. There are rims of epithelialization on the left thumb coming from the medial side on the right. Using Hydrofera Blue 5/12; using Hydrofera Blue. These continue to make improvements in surface area. His drainage was not listed as severe I therefore went ahead and put a cast on the left foot. Right foot we will continue to dress his previous 5/16; back for first total contact cast change. He did not tolerate this particularly well cast injury on the anterior tibia among other issues. Difficulty sleeping. I talked him about this in some detail and afterwards is elected to continue. I told him I would like to have a cast on for 3 weeks to see if this is going to help at all. I think he agreed 5/19; I think the wound is  better. There is no tunneling towards his midfoot. The undermining medially also looks better. He has a rim of new skin distally. I think we are making progress here. The area on the left also continues to look somewhat better to me using Hydrofera Blue. He has a list of complaints about the cast but none of them seem serious 5/26; patient presents for 1 week follow-up. He has been  using a total contact cast and tolerating this well. Hydrofera Blue is the main dressing used. He denies signs of infection. 6/2 Hydrofera Blue total contact cast on the left. These were large ulcers that formed in intensive care unit where the patient was recovering from [place]. May have had something to do with being ventilated in an upright positiono Pressors etc. We have been able to get the areas down considerably and a viable surface. There is some epithelialization in both sides. Note made of drainage 6/9; changed to [facility] last time because of drainage. He arrives with better looking surfaces and dimensions on the left than the right. Paradoxically the right actually probes more towards his midfoot the left is largely close down but both of these look improved. Using a total contact cast on the left 6/16; complex wounds on his bilateral plantar heels which were initially pressure injury from a stay in the ICU with COVID. We have been using silver alginate most recently. His dimensions of come in quite dramatically however not recently. We have been putting the left foot in a total contact cast 6/23; complex wounds on the bilateral plantar heels. I been putting the left in the cast paradoxically the area on the right is the one that is going towards closure at a faster rate. Quite a bit of drainage on the left. The patient went to see Dr. Amalia Hailey who said he was going to standby for skin grafts. I had actually considered sending him for skin grafts however he would be mandatorily off his feet for a period of weeks to months. I am thinking that the area on the right is going to close on its own the area on the left has been more stubborn even though we have him in a total contact cast 6/30; took him out of a total contact cast last week is the right heel seem to be making better progress than the left where I was placing the cast. We are using silver alginate. Both wounds are smaller right  greater than left 7/12; both wounds look as though they are making some progress. We are using silver alginate. Heel offloading boots 7/26; very gradual progress especially on the right. Using silver alginate. He is wearing heel offloading boots 8/18; he continues to close these wounds down very gradually. Using silver alginate. The problem polymen being definitive about this is areas of what appears to be callus around the margins. This is not a 100% of the area but certainly sizable especially on the right 9/1; bilateral plantar feet wounds secondary to prolonged pressure while being ventilated for COVID-19 in an upright position. Essentially pressure ulcers on the bottom of his feet. He is made substantial progress using silver alginate. 9/14; bilateral plantar feet wounds secondary to prolonged pressure. Making progress using silver alginate. 9/29 bilateral plantar feet wounds secondary to prolonged pressure. I changed him to Iodoflex last week. MolecuLight showing reddened blush fluorescence 10/11; patient presents for follow-up. He has no issues or complaints today. He denies signs of infection. He continues to use Iodoflex and antibiotic  ointment to the wound beds. 10/27; 2-week follow-up. No evidence of infection. He has callus and thick dry skin around the wound margins we have been using Iodoflex and Bactroban which was in response to a moderate left MolecuLight reddish blush fluorescence. 11/10; 2-week follow-up. Wound margins again have thick callus however the measurements of the actual wound sites are a lot smaller. Everything looks reasonably healthy here. We have been using Iodoflex He was approved for prime matrix but I have elected to delay this given the improvement in the surface area. Hopefully I will not regret that decision as were getting close to the end of the year in terms of insurance payment 12/8; 2-week follow-up. Wounds are generally smaller in size. These were  initially substantial wounds extending into the forefoot all the way into the heel on the bilateral plantar feet. They are now both located on the plantar heel distal aspect both of these have a lot of callus around the wounds I used a #5 curette to remove this on the right and the left also some subcutaneous debris to try and get the wound edges were using Iodoflex. He has heel offloading shoe 12/22; 2-week follow-up. Not really much improvement. He has thick callus around the outer edges of both wounds. I remove this there is some nonviable subcutaneous tissue as well. We have been using Iodoflex. Her intake nurse and myself spontaneously thought of a total contact cast I went back in May. At that time we really were not seeing much of an improvement with a cast although the wound was in a much different situation I would like to retry this in 2 weeks and I discussed this with the patient 08/12/2021; the patient has had some improvement with the Iodoflex. The the area on the left heel plantar more improved than the right. I had to put him in a total contact cast on the left although I decided to put that off for 2 weeks. I am going to change his primary dressing to silver collagen. I think in both areas he has had some improvement most of the healing seems to be more proximal in the heel. The wounds are in the mid aspect. A lot of thick callus on the right heel however. Objective Constitutional Patient is hypertensive.. Pulse regular and within target range for patient.Marland Kitchen Respirations regular, non-labored and within target range.. Temperature is normal and within the target range for the patient.Marland Kitchen Appears in no distress. Vitals Time Taken: 10:17 AM, Height: 69 in, Weight: 280 lbs, BMI: 41.3, Temperature: 98.6 F, Pulse: 71 bpm, Respiratory Rate: 17 breaths/min, Blood Pressure: 161/80 mmHg. General Notes: Wound exam; bilateral plantar heel wounds. The patient has wounds in the mid aspect of the  original wound areas. On the left this is roughly 2.5 x 1 it has depth but no undermining. On the right a lot of thick callus around the margins that I removed with a #5 curette into subcutaneous tissue.I see no evidence of infection Integumentary (Hair, Skin) Wound #1 status is Open. Original cause of wound was Pressure Injury. The date acquired was: 10/07/2019. The wound has been in treatment 56 weeks. The wound is located on the Right Calcaneus. The wound measures 2cm length x 1.4cm width x 0.5cm depth; 2.199cm^2 area and 1.1cm^3 volume. There is Fat Layer (Subcutaneous Tissue) exposed. There is no tunneling or undermining noted. There is a medium amount of serosanguineous drainage noted. The wound margin is thickened. There is large (67-100%) red, pink granulation within  the wound bed. There is a small (1-33%) amount of necrotic tissue within the wound bed including Adherent Slough. Wound #2 status is Open. Original cause of wound was Pressure Injury. The date acquired was: 10/07/2019. The wound has been in treatment 56 weeks. The wound is located on the Left Calcaneus. The wound measures 3cm length x 0.9cm width x 0.4cm depth; 2.121cm^2 area and 0.848cm^3 volume. There is Fat Layer (Subcutaneous Tissue) exposed. There is no tunneling or undermining noted. There is a medium amount of serosanguineous drainage noted. The wound margin is thickened. There is large (67-100%) red granulation within the wound bed. There is a small (1-33%) amount of necrotic tissue within the wound bed including Adherent Slough. Assessment Active Problems ICD-10 Non-pressure chronic ulcer of other part of right foot with fat layer exposed Non-pressure chronic ulcer of other 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 Procedures Wound #1 Pre-procedure diagnosis of Wound #1 is a Pressure Ulcer located on the Right Calcaneus . There was a Excisional Skin/Subcutaneous Tissue Debridement with a total area of 2.8 sq cm  performed by Ricard Dillon., MD. With the following instrument(s): Curette to remove Viable and Non-Viable tissue/material. Material removed includes Callus, Subcutaneous Tissue, Slough, Skin: Dermis, and Skin: Epidermis after achieving pain control using Lidocaine. No specimens were taken. A time out was conducted at 11:00, prior to the start of the procedure. A Minimum amount of bleeding was controlled with Pressure. The procedure was tolerated well with a pain level of 0 throughout and a pain level of 0 following the procedure. Post Debridement Measurements: 2cm length x 1.4cm width x 0.5cm depth; 1.1cm^3 volume. Post debridement Stage noted as Category/Stage III. Character of Wound/Ulcer Post Debridement is improved. Post procedure Diagnosis Wound #1: Same as Pre-Procedure Plan Follow-up Appointments: Return Appointment in 2 weeks. - Dr. Dellia Nims Bathing/ Shower/ Hygiene: May shower with protection but do not get wound dressing(s) wet. Edema Control - Lymphedema / SCD / Other: Elevate legs to the level of the heart or above for 30 minutes daily and/or when sitting, a frequency of: - throughout the day Avoid standing for long periods of time. Moisturize legs daily. - right leg and foot every night. Off-Loading: Wedge shoe to: - Glophed Shoe to both feet. Other: - keep pressure off of the bottom of your feet Additional Orders / Instructions: Follow Nutritious Diet WOUND #1: - Calcaneus Wound Laterality: Right Cleanser: Normal Saline (Generic) Every Other Day/30 Days Discharge Instructions: Cleanse the wound with Normal Saline prior to applying a clean dressing using gauze sponges, not tissue or cotton balls. Cleanser: Wound Cleanser Every Other Day/30 Days Discharge Instructions: Cleanse the wound with wound cleanser prior to applying a clean dressing using gauze sponges, not tissue or cotton balls. Prim Dressing: Promogran Prisma Matrix, 4.34 (sq in) (silver collagen) (DME) (Generic)  Every Other Day/30 Days ary Discharge Instructions: Moisten collagen with saline or hydrogel Secondary Dressing: Woven Gauze Sponge, Non-Sterile 4x4 in (Generic) Every Other Day/30 Days Discharge Instructions: Apply over primary dressing as directed. Secondary Dressing: Zetuvit Plus 4x8 in (Generic) Every Other Day/30 Days Discharge Instructions: Apply over primary dressing as directed. Secured With: Elastic Bandage 4 inch (ACE bandage) (Generic) Every Other Day/30 Days Discharge Instructions: Secure with ACE bandage as directed. Secured With: [employer], 4.5x3.1 (in/yd) (Generic) Every Other Day/30 Days Discharge Instructions: Secure with Kerlix as directed. WOUND #2: - Calcaneus Wound Laterality: Left Cleanser: Normal Saline (Generic) Every Other Day/30 Days Discharge Instructions: Cleanse the wound with Normal  Saline prior to applying a clean dressing using gauze sponges, not tissue or cotton balls. Cleanser: Wound Cleanser Every Other Day/30 Days Discharge Instructions: Cleanse the wound with wound cleanser prior to applying a clean dressing using gauze sponges, not tissue or cotton balls. Prim Dressing: Promogran Prisma Matrix, 4.34 (sq in) (silver collagen) (DME) (Generic) Every Other Day/30 Days ary Discharge Instructions: Moisten collagen with saline or hydrogel Secondary Dressing: Woven Gauze Sponge, Non-Sterile 4x4 in (Generic) Every Other Day/30 Days Discharge Instructions: Apply over primary dressing as directed. Secondary Dressing: Zetuvit Plus 4x8 in (Generic) Every Other Day/30 Days Discharge Instructions: Apply over primary dressing as directed. Secured With: Elastic Bandage 4 inch (ACE bandage) (Generic) Every Other Day/30 Days Discharge Instructions: Secure with ACE bandage as directed. Secured With: [employer], 4.5x3.1 (in/yd) (Generic) Every Other Day/30 Days Discharge Instructions: Secure with Kerlix as directed. 1. I removed to silver collagen in  both areas. I would like to see if we get any further epithelialization 2. Still wondering about a total contact cast on the left but I put this off today 3. He might be a candidate for further advanced tissue perhaps additional Oasis or even Apligraf if we could put that in a cast [provided he is a diabetic] Engineer, maintenance) Signed: 08/17/2021 4:28:08 PM By: Linton Ham MD Signed: 08/17/2021 5:00:37 PM By: Levan Hurst RN, BSN Previous Signature: 08/12/2021 4:38:12 PM Version By: Linton Ham MD Entered By: Levan Hurst on 08/17/2021 07:51:22 -------------------------------------------------------------------------------- [place] Details Patient Name: Date of Service: [name], [place] 08/12/2021 Medical Record Number: [id number] Patient Account Number: [id number] Date of Birth/Sex: Treating RN: Dec 07, 1975 (46 y.o. [name] Primary Care Provider: [name] Other Clinician: Referring Provider: Treating Provider/Extender: [name] in Treatment: 56 Diagnosis Coding ICD-10 Codes Code Description [phone] Non-pressure chronic ulcer of other part of right foot with fat layer exposed L97.522 Non-pressure chronic ulcer of other 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 Facility Procedures CPT4 Code: [id number] Description: [place]ia TISSUE 20 SQ CM/< ICD-10 Diagnosis Description L97.512 Non-pressure chronic ulcer of other part of right foot with fat layer exposed Modifier: Quantity: 1 Physician Procedures : CPT4 Code Description Modifier [id number] - WC PHYS SUBQ TISS 20 SQ CM ICD-10 Diagnosis Description L97.512 Non-pressure chronic ulcer of other part of right foot with fat layer exposed Quantity: 1 Electronic Signature(s) Signed: 08/12/2021 4:38:12 PM By: Linton Ham MD Entered By: Linton Ham on 08/12/2021 11:06:11

## 2021-08-13 NOTE — Progress Notes (Signed)
[name] ([id number]) Visit Report for 08/12/2021 Arrival Information Details Patient Name: Date of Service: [name], [name] 08/12/2021 9:45 A M Medical Record Number: [id number] Patient Account Number: [id number] Date of Birth/Sex: Treating RN: 09-Aug-1975 (46 y.o. [name], [name] Primary Care [place]e Ciavarella: [name] Other Clinician: Referring Janeane Cozart: Treating Pravin Perezperez/Extender: [name] in Treatment: 56 Visit Information History Since Last Visit Added or deleted any medications: No Patient Arrived: Wheel Chair Any new allergies or adverse reactions: No Arrival Time: 10:16 Had a fall or experienced change in No Accompanied By: self activities of daily living that may affect Transfer Assistance: Manual risk of falls: Patient Identification Verified: Yes Signs or symptoms of abuse/neglect since last visito No Secondary Verification Process Completed: Yes Hospitalized since last visit: No Patient Requires Transmission-Based Precautions: No Implantable device outside of the clinic excluding No Patient Has Alerts: No cellular tissue based products placed in the center since last visit: Has Dressing in Place as Prescribed: Yes Has Compression in Place as Prescribed: Yes Pain Present Now: No Electronic Signature(s) Signed: 08/13/2021 11:40:24 AM By: [name] RN Entered By: [name] on 08/12/2021 10:16:42 -------------------------------------------------------------------------------- Encounter Discharge Information Details Patient Name: Date of Service: [name], [name] 08/12/2021 9:45 A M Medical Record Number: [id number] Patient Account Number: [id number] Date of Birth/Sex: Treating RN: 06-02-1976 (46 y.o. [name], [name] Primary Care Christianjames Soule: [name] Other Clinician: Referring Syrianna [name]: Treating Sahej Hauswirth/Extender: [name] in Treatment: 93 Encounter Discharge Information Items Post  Procedure Vitals Discharge Condition: Stable Temperature (F): 97.4 Ambulatory Status: Wheelchair Pulse (bpm): 74 Discharge Destination: Home Respiratory Rate (breaths/min): 17 Transportation: Private Auto Blood Pressure (mmHg): 147/74 Accompanied By: self Schedule Follow-up Appointment: Yes Clinical Summary of Care: Patient Declined Electronic Signature(s) Signed: 08/13/2021 11:40:24 AM By: [name] RN Entered By: [name] on 08/12/2021 11:08:36 -------------------------------------------------------------------------------- Lower Extremity Assessment Details Patient Name: Date of Service: [name], [name] 08/12/2021 9:45 A M Medical Record Number: [id number] Patient Account Number: [id number] Date of Birth/Sex: Treating RN: 06-27-1976 (46 y.o. [name], [name] Primary Care Cyara Devoto: [name] Other Clinician: Referring Gracin Mcpartland: Treating Antwone Capozzoli/Extender: [name] in Treatment: 56 Edema Assessment Assessed: [name]: Yes] [name]: Yes] Edema: [Left: No] [Right: No] Calf Left: Right: Point of Measurement: 29 cm From Medial Instep 42.5 cm 44 cm Ankle Left: Right: Point of Measurement: 9 cm From Medial Instep 25 cm 29 cm Vascular Assessment Pulses: Dorsalis Pedis Palpable: [Left:Yes] [Right:Yes] Posterior Tibial Palpable: [Left:Yes] [Right:Yes] Electronic Signature(s) Signed: 08/13/2021 11:40:24 AM By: [name] RN Entered By: [name] on 08/12/2021 10:18:34 -------------------------------------------------------------------------------- Multi Wound Chart Details Patient Name: Date of Service: [name], [name] 08/12/2021 9:45 A M Medical Record Number: [id number] Patient Account Number: [id number] Date of Birth/Sex: Treating RN: 11-24-1975 (46 y.o. [name] Primary Care Almalik Weissberg: [name] Other Clinician: Referring Verlie Hellenbrand: Treating Javier Mamone/Extender: [name] in  Treatment: 56 Vital Signs Height(in): 69 Pulse(bpm): 14 Weight(lbs): 67 Blood Pressure(mmHg): 161/80 Body Mass Index(BMI): 41 Temperature(F): 98.6 Respiratory Rate(breaths/min): 17 Photos: [1:Right Calcaneus] [2:Left Calcaneus] [N/A:N/A N/A] Wound Location: [1:Pressure Injury] [2:Pressure Injury] [N/A:N/A] Wounding Event: [1:Pressure Ulcer] [2:Pressure Ulcer] [N/A:N/A] Primary Etiology: [1:Asthma, Angina, Hypertension] [2:Asthma, Angina, Hypertension] [N/A:N/A] Comorbid History: [1:10/07/2019] [2:10/07/2019] [N/A:N/A] Date Acquired: [1:56] [2:56] [N/A:N/A] Weeks of Treatment: [1:Open] [2:Open] [N/A:N/A] Wound Status: [1:2x1.4x0.5] [2:3x0.9x0.4] [N/A:N/A] Measurements L x W x D (cm) [1:2.199] [2:2.121] [N/A:N/A] A (cm) : rea [1:1.1] [2:0.848] [N/A:N/A] Volume (cm) : [1:93.00%] [2:92.20%] [  N/A:N/A] % Reduction in A rea: [1:96.50%] [2:96.90%] [N/A:N/A] % Reduction in Volume: [1:Category/Stage III] [2:Category/Stage III] [N/A:N/A] Classification: [1:Medium] [2:Medium] [N/A:N/A] Exudate A mount: [1:Serosanguineous] [2:Serosanguineous] [N/A:N/A] Exudate Type: [1:red, brown] [2:red, brown] [N/A:N/A] Exudate Color: [1:Thickened] [2:Thickened] [N/A:N/A] Wound Margin: [1:Large (67-100%)] [2:Large (67-100%)] [N/A:N/A] Granulation A mount: [1:Red, Pink] [2:Red] [N/A:N/A] Granulation Quality: [1:Small (1-33%)] [2:Small (1-33%)] [N/A:N/A] Necrotic A mount: [1:Fat Layer (Subcutaneous Tissue): Yes Fat Layer (Subcutaneous Tissue): Yes N/A] Exposed Structures: [1:Fascia: No Tendon: No Muscle: No Joint: No Bone: No Small (1-33%)] [2:Fascia: No Tendon: No Muscle: No Joint: No Bone: No Small (1-33%)] [N/A:N/A] Treatment Notes Electronic Signature(s) Signed: 08/12/2021 4:38:12 PM By: Linton Ham MD Signed: 08/12/2021 5:33:22 PM By: [name] RN, BSN Entered By: Linton Ham on 08/12/2021  11:01:36 -------------------------------------------------------------------------------- Multi-Disciplinary Care Plan Details Patient Name: Date of Service: [name], [name] [place] E. 08/12/2021 9:45 A M Medical Record Number: [id number] Patient Account Number: [id number] Date of Birth/Sex: Treating RN: 01-19-1976 (46 y.o. [name], [name] Primary Care Tametra Ahart: [name] Other Clinician: Referring Kade Demicco: Treating Casondra Gasca/Extender: [name] in Treatment: 56 Multidisciplinary Care Plan reviewed with physician Active Inactive Abuse / Safety / Falls / Self Care Management Nursing Diagnoses: Potential for falls Goals: Patient/caregiver will verbalize/demonstrate measures taken [name] prevent injury and/or falls Date Initiated: 07/15/2020 Target Resolution Date: 09/04/2021 Goal Status: Active Interventions: Assess fall risk on admission and as needed Assess impairment of mobility on admission and as needed per policy Notes: Wound/Skin Impairment Nursing Diagnoses: Impaired tissue integrity Knowledge deficit related [name] ulceration/compromised skin integrity Goals: Patient/caregiver will verbalize understanding of skin care regimen Date Initiated: 07/15/2020 Target Resolution Date: 09/04/2021 Goal Status: Active Ulcer/skin breakdown will have a volume reduction of 30% by week 4 Date Initiated: 07/15/2020 Date Inactivated: 08/20/2020 Target Resolution Date: 09/03/2020 Goal Status: Unmet Unmet Reason: no major changes. Ulcer/skin breakdown will heal within 14 weeks Date Initiated: 12/04/2020 Date Inactivated: 12/10/2020 Target Resolution Date: 12/10/2020 Unmet Reason: wounds still open at 14 Goal Status: Unmet weeks and today 21 weeks. Interventions: Assess patient/caregiver ability [name] obtain necessary supplies Assess patient/caregiver ability [name] perform ulcer/skin care regimen upon admission and as needed Assess ulceration(s) every visit Provide education on  ulcer and skin care Treatment Activities: Skin care regimen initiated : 07/15/2020 Topical wound management initiated : 07/15/2020 Notes: Electronic Signature(s) Signed: 08/13/2021 11:40:24 AM By: [name] RN Entered By: [name] on 08/12/2021 10:47:38 -------------------------------------------------------------------------------- Pain Assessment Details Patient Name: Date of Service: [name], [place]. 08/12/2021 9:45 A M Medical Record Number: [id number] Patient Account Number: [id number] Date of Birth/Sex: Treating RN: 07-26-1976 (46 y.o. [name], [name] Primary Care Salah Burlison: [name] Other Clinician: Referring Lavi Sheehan: Treating Nimrod Wendt/Extender: [name] in Treatment: 56 Active Problems Location of Pain Severity and Description of Pain Patient Has Paino No Site Locations Pain Management and Medication Current Pain Management: Electronic Signature(s) Signed: 08/13/2021 11:40:24 AM By: [name] RN Entered By: [name] on 08/12/2021 10:17:43 -------------------------------------------------------------------------------- Patient/Caregiver Education Details Patient Name: Date of Service: Ciro Backer 1/5/2023andnbsp9:45 Liberal Record Number: [id number] Patient Account Number: [id number] Date of Birth/Gender: Treating RN: Apr 04, 1976 (46 y.o. [name] Primary Care Physician: [name] Other Clinician: Referring Physician: Treating Physician/Extender: [name] in Treatment: 23 Education Assessment Education Provided [name]: Patient Education Topics Provided Wound/Skin Impairment: Methods: Explain/Verbal Responses: State content correctly Electronic Signature(s) Signed: 08/13/2021 11:40:24 AM By: [name] RN Entered By: [name] on 08/12/2021 10:36:39 -------------------------------------------------------------------------------- Wound  Assessment Details Patient Name: Date of Service: [name], [place] 08/12/2021 9:45 A M Medical Record Number: [id number] Patient Account Number: [id number] Date of Birth/Sex: Treating RN: 07/11/1976 (46 y.o. [name], [name] Primary Care Lavine Hargrove: [name] Other Clinician: Referring Euel Castile: Treating Skyleen Bentley/Extender: [name] in Treatment: 88 Wound Status Wound Number: 1 Primary Etiology: Pressure Ulcer Wound Location: Right Calcaneus Wound Status: Open Wounding Event: Pressure Injury Comorbid History: Asthma, Angina, Hypertension Date Acquired: 10/07/2019 Weeks Of Treatment: 56 Clustered Wound: No Photos Wound Measurements Length: (cm) 2 Width: (cm) 1.4 Depth: (cm) 0.5 Area: (cm) 2.199 Volume: (cm) 1.1 % Reduction in Area: 93% % Reduction in Volume: 96.5% Epithelialization: Small (1-33%) Tunneling: No Undermining: No Wound Description Classification: Category/Stage III Wound Margin: Thickened Exudate Amount: Medium Exudate Type: Serosanguineous Exudate Color: red, brown Foul Odor After Cleansing: No Slough/Fibrino No Wound Bed Granulation Amount: Large (67-100%) Exposed Structure Granulation Quality: Red, Pink Fascia Exposed: No Necrotic Amount: Small (1-33%) Fat Layer (Subcutaneous Tissue) Exposed: Yes Necrotic Quality: Adherent Slough Tendon Exposed: No Muscle Exposed: No Joint Exposed: No Bone Exposed: No Treatment Notes Wound #1 (Calcaneus) Wound Laterality: Right Cleanser Normal Saline Discharge Instruction: Cleanse the wound with Normal Saline prior [name] applying a clean dressing using gauze sponges, not tissue or cotton balls. Wound Cleanser Discharge Instruction: Cleanse the wound with wound cleanser prior [name] applying a clean dressing using gauze sponges, not tissue or cotton balls. Peri-Wound Care Topical Primary Dressing Promogran Prisma Matrix, 4.34 (sq in) (silver collagen) Discharge Instruction: Moisten  collagen with saline or hydrogel Secondary Dressing Woven Gauze Sponge, Non-Sterile 4x4 in Discharge Instruction: Apply over primary dressing as directed. Zetuvit Plus 4x8 in Discharge Instruction: Apply over primary dressing as directed. Secured With Elastic Bandage 4 inch (ACE bandage) Discharge Instruction: Secure with ACE bandage as directed. Kerlix Roll Sterile, 4.5x3.1 (in/yd) Discharge Instruction: Secure with Kerlix as directed. Compression Wrap Compression Stockings Add-Ons Electronic Signature(s) Signed: 08/13/2021 11:40:24 AM By: [name] RN Entered By: [name] on 08/12/2021 10:29:22 -------------------------------------------------------------------------------- Wound Assessment Details Patient Name: Date of Service: [name], [place] 08/12/2021 9:45 A M Medical Record Number: [id number] Patient Account Number: [id number] Date of Birth/Sex: Treating RN: Sep 16, 1975 (46 y.o. [name], [name] Primary Care Garner Dullea: [name] Other Clinician: Referring Ajmal Kathan: Treating Raymon Schlarb/Extender: [name] in Treatment: 50 Wound Status Wound Number: 2 Primary Etiology: Pressure Ulcer Wound Location: Left Calcaneus Wound Status: Open Wounding Event: Pressure Injury Comorbid History: Asthma, Angina, Hypertension Date Acquired: 10/07/2019 Weeks Of Treatment: 56 Clustered Wound: No Photos Wound Measurements Length: (cm) 3 Width: (cm) 0.9 Depth: (cm) 0.4 Area: (cm) 2.121 Volume: (cm) 0.848 % Reduction in Area: 92.2% % Reduction in Volume: 96.9% Epithelialization: Small (1-33%) Tunneling: No Undermining: No Wound Description Classification: Category/Stage III Wound Margin: Thickened Exudate Amount: Medium Exudate Type: Serosanguineous Exudate Color: red, brown Foul Odor After Cleansing: No Slough/Fibrino No Wound Bed Granulation Amount: Large (67-100%) Exposed Structure Granulation Quality: Red Fascia Exposed:  No Necrotic Amount: Small (1-33%) Fat Layer (Subcutaneous Tissue) Exposed: Yes Necrotic Quality: Adherent Slough Tendon Exposed: No Muscle Exposed: No Joint Exposed: No Bone Exposed: No Treatment Notes Wound #2 (Calcaneus) Wound Laterality: Left Cleanser Normal Saline Discharge Instruction: Cleanse the wound with Normal Saline prior [name] applying a clean dressing using gauze sponges, not tissue or cotton balls. Wound Cleanser Discharge Instruction: Cleanse the wound with wound cleanser prior [name] applying a clean dressing using gauze sponges, not tissue or cotton balls. Peri-Wound Care Topical Primary  Dressing Promogran Prisma Matrix, 4.34 (sq in) (silver collagen) Discharge Instruction: Moisten collagen with saline or hydrogel Secondary Dressing Woven Gauze Sponge, Non-Sterile 4x4 in Discharge Instruction: Apply over primary dressing as directed. Zetuvit Plus 4x8 in Discharge Instruction: Apply over primary dressing as directed. Secured With Elastic Bandage 4 inch (ACE bandage) Discharge Instruction: Secure with ACE bandage as directed. Kerlix Roll Sterile, 4.5x3.1 (in/yd) Discharge Instruction: Secure with Kerlix as directed. Compression Wrap Compression Stockings Add-Ons Electronic Signature(s) Signed: 08/13/2021 11:40:24 AM By: [name] RN Entered By: [name] on 08/12/2021 10:29:55 -------------------------------------------------------------------------------- Vitals Details Patient Name: Date of Service: [name], [place] 08/12/2021 9:45 A M Medical Record Number: [id number] Patient Account Number: [id number] Date of Birth/Sex: Treating RN: 1975-11-17 (46 y.o. [name], [name] Primary Care [place] Sigmon: [name] Other Clinician: Referring Innocence Schlotzhauer: Treating Kentrell Guettler/Extender: [name] in Treatment: 56 Vital Signs Time Taken: 10:17 Temperature (F): 98.6 Height (in): 69 Pulse (bpm): 71 Weight (lbs): 280 Respiratory  Rate (breaths/min): 17 Body Mass Index (BMI): 41.3 Blood Pressure (mmHg): 161/80 Reference Range: 80 - 120 mg / dl Electronic Signature(s) Signed: 08/13/2021 11:40:24 AM By: [name] RN Entered By: [name] on 08/12/2021 10:17:07

## 2021-08-20 ENCOUNTER — Other Ambulatory Visit: Payer: Self-pay

## 2021-08-20 ENCOUNTER — Encounter: Payer: Self-pay | Admitting: Behavioral Health

## 2021-08-20 ENCOUNTER — Ambulatory Visit (INDEPENDENT_AMBULATORY_CARE_PROVIDER_SITE_OTHER): Payer: BC Managed Care – PPO | Admitting: Behavioral Health

## 2021-08-20 DIAGNOSIS — F331 Major depressive disorder, recurrent, moderate: Secondary | ICD-10-CM | POA: Diagnosis not present

## 2021-08-20 DIAGNOSIS — F411 Generalized anxiety disorder: Secondary | ICD-10-CM | POA: Diagnosis not present

## 2021-08-20 DIAGNOSIS — F4321 Adjustment disorder with depressed mood: Secondary | ICD-10-CM | POA: Diagnosis not present

## 2021-08-20 DIAGNOSIS — F5105 Insomnia due to other mental disorder: Secondary | ICD-10-CM

## 2021-08-20 DIAGNOSIS — F99 Mental disorder, not otherwise specified: Secondary | ICD-10-CM

## 2021-08-20 MED ORDER — LAMOTRIGINE 25 MG PO TABS: ORAL_TABLET | ORAL | 3 refills | Status: DC

## 2021-08-20 MED ORDER — TRAZODONE HCL 100 MG PO TABS: 200.0000 mg | ORAL_TABLET | Freq: Every day | ORAL | 3 refills | Status: DC

## 2021-08-20 NOTE — Progress Notes (Addendum)
[facility]  Patient ID: [name],  MRN: [id number]  PCP: Michael Boston, MD  Date of Evaluation: 08/20/2021 Time spent:30 minutes  Chief Complaint:  Chief Complaint   Anxiety; Depression; Follow-up; Medication Refill; Family Problem     HISTORY/CURRENT STATUS: HPI 46 year old male presents to this office for follow up and medicaiton management. Says that Lamictal has been working very well. Says he feels like his emotions are not all over the place and "not on the roller coaster". Says his anger and agitation has been more subdued. He is considering a therapist to work through some of his disappointments in life due to his health issues. Says his relationship with his wife has improved. He says his anxiety level today is 3/10 and depression is 3/10. He is sleeping 7-8 hours per night. He says he does not want to make any medication changes at this time.. Denies mania, no psychosis, no current SI. No HI.    No prior psychiatric medication trials    Individual Medical History/ Review of Systems: Changes? :No   Allergies: Patient has no known allergies.  Current Medications:  Current Outpatient Medications:    albuterol (PROAIR HFA) 108 (90 Base) MCG/ACT inhaler, Inhale 2 puffs into the lungs every 4 (four) hours as needed for wheezing or shortness of breath., Disp: 18 g, Rfl: 0   amLODipine (NORVASC) 2.5 MG tablet, Take 1 tablet (2.5 mg total) by mouth daily., Disp: 30 tablet, Rfl: 5   budesonide-formoterol (SYMBICORT) 160-4.5 MCG/ACT inhaler, Inhale 2 puffs into the lungs 2 (two) times daily., Disp: 10.2 g, Rfl: 0   DULoxetine (CYMBALTA) 60 MG capsule, Take 1 capsule (60 mg total) by mouth daily., Disp: 90 capsule, Rfl: 3   HYDROcodone-acetaminophen (NORCO) 5-325 MG tablet, Take 1 tablet by mouth every 6 (six) hours as needed for moderate pain., Disp: 60 tablet, Rfl: 0   lamoTRIgine (LAMICTAL) 25 MG tablet, Take two tablets at once daily., Disp: 60 tablet, Rfl: 3    metoprolol tartrate (LOPRESSOR) 25 MG tablet, TAKE 1 TABLET BY MOUTH TWICE A DAY, Disp: 180 tablet, Rfl: 3   montelukast (SINGULAIR) 10 MG tablet, Take 10 mg by mouth daily., Disp: , Rfl:    pantoprazole (PROTONIX) 40 MG tablet, Take 1 tablet (40 mg total) by mouth daily., Disp:  , Rfl:    traZODone (DESYREL) 100 MG tablet, Take 2 tablets (200 mg total) by mouth at bedtime., Disp: 90 tablet, Rfl: 3   triamcinolone (NASACORT) 55 MCG/ACT AERO nasal inhaler, Place 2 sprays into the nose daily., Disp: , Rfl:  Medication Side Effects: none  Family Medical/ Social History: Changes? No  MENTAL HEALTH EXAM:  There were no vitals taken for this visit.There is no height or weight on file to calculate BMI.  General Appearance: Casual, Neat, and Well Groomed  Eye Contact:  Good  Speech:  Clear and Coherent  Volume:  Normal  Mood:  NA  Affect:  Congruent  Thought Process:  Coherent  Orientation:  Full (Time, Place, and Person)  Thought Content: Logical   Suicidal Thoughts:  No  Homicidal Thoughts:  No  Memory:  WNL  Judgement:  Good  Insight:  Good  Psychomotor Activity:  Normal  Concentration:  Concentration: Good  Recall:  Good  Fund of Knowledge: Good  Language: Good  Assets:  Desire for Improvement  ADL's:  Intact  Cognition: WNL  Prognosis:  Good    DIAGNOSES:    ICD-10-CM   1. Insomnia  due to other mental disorder  F51.05 traZODone (DESYREL) 100 MG tablet   F99     2. Generalized anxiety disorder  F41.1 lamoTRIgine (LAMICTAL) 25 MG tablet    3. Major depressive disorder, recurrent episode, moderate (HCC)  F33.1 lamoTRIgine (LAMICTAL) 25 MG tablet    4. Situational depression  F43.21 lamoTRIgine (LAMICTAL) 25 MG tablet      Receiving Psychotherapy: No    RECOMMENDATIONS:   Greater than 50% of 30 min face to face time with patient was spent on counseling and coordination of care. We discussed patients chronic health challenges and associated hx of anxiety and  depression. Discussed his safety when meeting people online. Discussed medication options, side effects and his goals for tx. Recommended Psychotherapy to Pt. We agreed to: Will continue Cymbalta 60 mg daily Continue Trazodone 200 mg at bedtime Continue Abilify 2 mg daily at bedtime To start Lamictal 25 mg for 14 days, then 50 mg daily Will report any side effects or worsening symptoms To follow up in 4 weeks to reassess Provided emergency contact information Reviewed [place], NP

## 2021-08-26 ENCOUNTER — Encounter (HOSPITAL_BASED_OUTPATIENT_CLINIC_OR_DEPARTMENT_OTHER): Payer: BC Managed Care – PPO | Admitting: Internal Medicine

## 2021-08-26 ENCOUNTER — Other Ambulatory Visit: Payer: Self-pay

## 2021-08-26 DIAGNOSIS — Z8616 Personal history of COVID-19: Secondary | ICD-10-CM | POA: Diagnosis not present

## 2021-08-26 DIAGNOSIS — L97512 Non-pressure chronic ulcer of other part of right foot with fat layer exposed: Secondary | ICD-10-CM | POA: Diagnosis not present

## 2021-08-26 DIAGNOSIS — L97522 Non-pressure chronic ulcer of other 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: Secondary | ICD-10-CM | POA: Diagnosis not present

## 2021-08-26 DIAGNOSIS — L89613 Pressure ulcer of right heel, stage 3: Secondary | ICD-10-CM | POA: Diagnosis not present

## 2021-08-26 DIAGNOSIS — I10 Essential (primary) hypertension: Secondary | ICD-10-CM | POA: Diagnosis not present

## 2021-08-26 NOTE — Progress Notes (Signed)
RAYMUNDO, ROUT ([id number]) Visit Report for 08/26/2021 Arrival Information Details Patient Name: Date of Service: [name], [place] 08/26/2021 9:45 A M Medical Record Number: [id number] Patient Account Number: [id number] Date of Birth/Sex: Treating RN: [birth year] (46 y.o. [name] Primary Care Wylee Dorantes: [name] Other Clinician: Referring Bastion Bolger: Treating Syana Degraffenreid/Extender: [name] in Treatment: 3 Visit Information History Since Last Visit Added or deleted any medications: No Patient Arrived: Wheel Chair Any new allergies or adverse reactions: No Arrival Time: 10:16 Had a fall or experienced change in No Accompanied By: wife activities of daily living that may affect Transfer Assistance: None risk of falls: Patient Identification Verified: Yes Signs or symptoms of abuse/neglect since last visito No Secondary Verification Process Completed: Yes Hospitalized since last visit: No Patient Requires Transmission-Based Precautions: No Implantable device outside of the clinic excluding No Patient Has Alerts: No cellular tissue based products placed in the center since last visit: Has Dressing in Place as Prescribed: Yes Pain Present Now: Yes Electronic Signature(s) Signed: 08/26/2021 11:34:21 AM By: Sandre Kitty Entered By: Sandre Kitty on 08/26/2021 10:16:44 -------------------------------------------------------------------------------- Encounter Discharge Information Details Patient Name: Date of Service: [name], [place] 08/26/2021 9:45 A M Medical Record Number: [id number] Patient Account Number: [id number] Date of Birth/Sex: Treating RN: Apr 02, 1976 (46 y.o. [name] Primary Care Zymier Rodgers: [name] Other Clinician: Referring Eron Staat: Treating Sophronia Varney/Extender: [name] in Treatment: 18 Encounter Discharge Information Items Post Procedure Vitals Discharge Condition: Stable Temperature  (F): 98.5 Ambulatory Status: Wheelchair Pulse (bpm): 80 Discharge Destination: Home Respiratory Rate (breaths/min): 20 Transportation: Private Auto Blood Pressure (mmHg): 148/82 Accompanied By: family Schedule Follow-up Appointment: Yes Clinical Summary of Care: Electronic Signature(s) Signed: 08/26/2021 5:47:38 PM By: [name] RN, BSN Entered By: [name] on 08/26/2021 10:56:10 -------------------------------------------------------------------------------- Lower Extremity Assessment Details Patient Name: Date of Service: [name], [place] 08/26/2021 9:45 A M Medical Record Number: [id number] Patient Account Number: [id number] Date of Birth/Sex: Treating RN: [birth year] (46 y.o. [name] Primary Care Pavel Gadd: [name] Other Clinician: Referring Shandi Godfrey: Treating Keshawn Sundberg/Extender: [name] in Treatment: 58 Edema Assessment Assessed: [name]: Yes] [name]: Yes] Edema: [Left: No] [Right: No] Calf Left: Right: Point of Measurement: 29 cm From Medial Instep 42 cm 42 cm Ankle Left: Right: Point of Measurement: 9 cm From Medial Instep 24 cm 24 cm Vascular Assessment Pulses: Dorsalis Pedis Palpable: [Left:Yes] [Right:Yes] Electronic Signature(s) Signed: 08/26/2021 5:47:38 PM By: [name] RN, BSN Entered By: [name] on 08/26/2021 10:32:03 -------------------------------------------------------------------------------- Multi Wound Chart Details Patient Name: Date of Service: [name], [place] 08/26/2021 9:45 A M Medical Record Number: [id number] Patient Account Number: [id number] Date of Birth/Sex: Treating RN: 08-Nov-1975 (46 y.o. [name], [name] Primary Care Jamil Armwood: [name] Other Clinician: Referring Jashaun Penrose: Treating Ellasyn Swilling/Extender: [name] in Treatment: 14 Vital Signs Height(in): 69 Pulse(bpm): 80 Weight(lbs): 280 Blood Pressure(mmHg): 148/82 Body Mass Index(BMI):  41 Temperature(F): 98.5 Respiratory Rate(breaths/min): 17 Photos: [1:Right Calcaneus] [2:Left Calcaneus] [N/A:N/A N/A] Wound Location: [1:Pressure Injury] [2:Pressure Injury] [N/A:N/A] Wounding Event: [1:Pressure Ulcer] [2:Pressure Ulcer] [N/A:N/A] Primary Etiology: [1:Asthma, Angina, Hypertension] [2:Asthma, Angina, Hypertension] [N/A:N/A] Comorbid History: [1:10/07/2019] [2:10/07/2019] [N/A:N/A] Date Acquired: [1:58] [2:58] [N/A:N/A] Weeks of Treatment: [1:Open] [2:Open] [N/A:N/A] Wound Status: [1:3x1x0.4] [2:2x0.7x0.6] [N/A:N/A] Measurements L x W x D (cm) [1:2.356] [2:1.1] [N/A:N/A] A (cm) : rea [1:0.942] [2:0.66] [N/A:N/A] Volume (cm) : [1:92.50%] [2:95.90%] [N/A:N/A] % Reduction in A rea: [1:97.00%] [2:97.60%] [N/A:N/A] % Reduction in Volume: [  1:Category/Stage III] [2:Category/Stage III] [N/A:N/A] Classification: [1:Medium] [2:Medium] [N/A:N/A] Exudate A mount: [1:Serosanguineous] [2:Serosanguineous] [N/A:N/A] Exudate Type: [1:red, brown] [2:red, brown] [N/A:N/A] Exudate Color: [1:Thickened] [2:Thickened] [N/A:N/A] Wound Margin: [1:Large (67-100%)] [2:Large (67-100%)] [N/A:N/A] Granulation A mount: [1:Red, Pink] [2:Red] [N/A:N/A] Granulation Quality: [1:Small (1-33%)] [2:Small (1-33%)] [N/A:N/A] Necrotic A mount: [1:Fat Layer (Subcutaneous Tissue): Yes Fat Layer (Subcutaneous Tissue): Yes N/A] Exposed Structures: [1:Fascia: No Tendon: No Muscle: No Joint: No Bone: No Small (1-33%)] [2:Fascia: No Tendon: No Muscle: No Joint: No Bone: No Small (1-33%)] [N/A:N/A] Epithelialization: [1:Debridement - Excisional] [2:N/A] [N/A:N/A] Debridement: Pre-procedure Verification/Time Out 10:45 [2:N/A] [N/A:N/A] Taken: [1:Lidocaine 5% topical ointment] [2:N/A] [N/A:N/A] Pain Control: [1:Callus, Subcutaneous] [2:N/A] [N/A:N/A] Tissue Debrided: [1:Skin/Subcutaneous Tissue] [2:N/A] [N/A:N/A] Level: [1:6] [2:N/A] [N/A:N/A] Debridement A (sq cm): [1:rea Curette] [2:N/A] [N/A:N/A] Instrument:  [1:Minimum] [2:N/A] [N/A:N/A] Bleeding: [1:Pressure] [2:N/A] [N/A:N/A] Hemostasis A chieved: [1:0] [2:N/A] [N/A:N/A] Procedural Pain: [1:0] [2:N/A] [N/A:N/A] Post Procedural Pain: [1:Procedure was tolerated well] [2:N/A] [N/A:N/A] Debridement Treatment Response: [1:3x1x0.4] [2:N/A] [N/A:N/A] Post Debridement Measurements L x W x D (cm) [1:0.942] [2:N/A] [N/A:N/A] Post Debridement Volume: (cm) [1:Category/Stage III] [2:N/A] [N/A:N/A] Post Debridement Stage: [1:callous] [2:N/A] [N/A:N/A] Assessment Notes: [1:Debridement] [2:N/A] [N/A:N/A] Treatment Notes Wound #1 (Calcaneus) Wound Laterality: Right Cleanser Normal Saline Discharge Instruction: Cleanse the wound with Normal Saline prior to applying a clean dressing using gauze sponges, not tissue or cotton balls. Wound Cleanser Discharge Instruction: Cleanse the wound with wound cleanser prior to applying a clean dressing using gauze sponges, not tissue or cotton balls. Peri-Wound Care Topical Primary Dressing Promogran Prisma Matrix, 4.34 (sq in) (silver collagen) Discharge Instruction: Moisten collagen with saline or hydrogel Secondary Dressing Woven Gauze Sponge, Non-Sterile 4x4 in Discharge Instruction: Apply over primary dressing as directed. Zetuvit Plus 4x8 in Discharge Instruction: Apply over primary dressing as directed. Secured With Elastic Bandage 4 inch (ACE bandage) Discharge Instruction: Secure with ACE bandage as directed. Kerlix Roll Sterile, 4.5x3.1 (in/yd) Discharge Instruction: Secure with Kerlix as directed. Compression Wrap Compression Stockings Add-Ons Wound #2 (Calcaneus) Wound Laterality: Left Cleanser Normal Saline Discharge Instruction: Cleanse the wound with Normal Saline prior to applying a clean dressing using gauze sponges, not tissue or cotton balls. Wound Cleanser Discharge Instruction: Cleanse the wound with wound cleanser prior to applying a clean dressing using gauze sponges, not tissue or  cotton balls. Peri-Wound Care Topical Primary Dressing Promogran Prisma Matrix, 4.34 (sq in) (silver collagen) Discharge Instruction: Moisten collagen with saline or hydrogel Secondary Dressing Woven Gauze Sponge, Non-Sterile 4x4 in Discharge Instruction: Apply over primary dressing as directed. Zetuvit Plus 4x8 in Discharge Instruction: Apply over primary dressing as directed. Secured With Elastic Bandage 4 inch (ACE bandage) Discharge Instruction: Secure with ACE bandage as directed. Kerlix Roll Sterile, 4.5x3.1 (in/yd) Discharge Instruction: Secure with Kerlix as directed. Compression Wrap Compression Stockings Add-Ons Electronic Signature(s) Signed: 08/26/2021 4:44:32 PM By: Linton Ham MD Signed: 08/26/2021 5:47:38 PM By: [name] RN, BSN Entered By: Linton Ham on 08/26/2021 11:30:20 -------------------------------------------------------------------------------- Multi-Disciplinary Care Plan Details Patient Name: Date of Service: [name], [place] 08/26/2021 9:45 A M Medical Record Number: [id number] Patient Account Number: [id number] Date of Birth/Sex: Treating RN: [birth year] (46 y.o. [name] Primary Care Skilynn Durney: [name] Other Clinician: Referring Lorne Winkels: Treating Latorie Montesano/Extender: [name] in Treatment: 32 Multidisciplinary Care Plan reviewed with physician Active Inactive Abuse / Safety / Falls / Self Care Management Nursing Diagnoses: Potential for falls Goals: Patient/caregiver will verbalize/demonstrate measures taken to prevent injury and/or falls Date Initiated: 07/15/2020 Target Resolution Date: 09/04/2021 Goal  Status: Active Interventions: Assess fall risk on admission and as needed Assess impairment of mobility on admission and as needed per policy Notes: Wound/Skin Impairment Nursing Diagnoses: Impaired tissue integrity Knowledge deficit related to ulceration/compromised skin  integrity Goals: Patient/caregiver will verbalize understanding of skin care regimen Date Initiated: 07/15/2020 Target Resolution Date: 09/04/2021 Goal Status: Active Ulcer/skin breakdown will have a volume reduction of 30% by week 4 Date Initiated: 07/15/2020 Date Inactivated: 08/20/2020 Target Resolution Date: 09/03/2020 Goal Status: Unmet Unmet Reason: no major changes. Ulcer/skin breakdown will heal within 14 weeks Date Initiated: 12/04/2020 Date Inactivated: 12/10/2020 Target Resolution Date: 12/10/2020 Unmet Reason: wounds still open at 14 Goal Status: Unmet weeks and today 21 weeks. Interventions: Assess patient/caregiver ability to obtain necessary supplies Assess patient/caregiver ability to perform ulcer/skin care regimen upon admission and as needed Assess ulceration(s) every visit Provide education on ulcer and skin care Treatment Activities: Skin care regimen initiated : 07/15/2020 Topical wound management initiated : 07/15/2020 Notes: Electronic Signature(s) Signed: 08/26/2021 5:47:38 PM By: [name] RN, BSN Entered By: [name] on 08/26/2021 10:35:22 -------------------------------------------------------------------------------- Pain Assessment Details Patient Name: Date of Service: [name], [place] 08/26/2021 9:45 A M Medical Record Number: [id number] Patient Account Number: [id number] Date of Birth/Sex: Treating RN: [birth year] (46 y.o. [name] Primary Care Carleen Rhue: [name] Other Clinician: Referring Leanette Eutsler: Treating Marce Schartz/Extender: [name] in Treatment: 2 Active Problems Location of Pain Severity and Description of Pain Patient Has Paino Yes Site Locations Rate the pain. Rate the pain. Current Pain Level: 4 Pain Management and Medication Current Pain Management: Electronic Signature(s) Signed: 08/26/2021 11:34:21 AM By: Sandre Kitty Signed: 08/26/2021 5:47:38 PM By: [name] RN, BSN Entered  By: Sandre Kitty on 08/26/2021 10:17:08 -------------------------------------------------------------------------------- Patient/Caregiver Education Details Patient Name: Date of Service: [name], [place] 1/19/2023andnbsp9:45 [place] Record Number: [id number] Patient Account Number: [id number] Date of Birth/Gender: Treating RN: [birth year] (46 y.o. [name] Primary Care Physician: [name] Other Clinician: Referring Physician: Treating Physician/Extender: [name] in Treatment: 76 Education Assessment Education Provided To: Patient Education Topics Provided Wound/Skin Impairment: Handouts: Skin Care Do's and Dont's Methods: Explain/Verbal Responses: Reinforcements needed Electronic Signature(s) Signed: 08/26/2021 5:47:38 PM By: [name] RN, BSN Entered By: [name] on 08/26/2021 10:35:40 -------------------------------------------------------------------------------- Wound Assessment Details Patient Name: Date of Service: [name], [place] 08/26/2021 9:45 A M Medical Record Number: [id number] Patient Account Number: [id number] Date of Birth/Sex: Treating RN: 1976-03-08 (46 y.o. [name] Primary Care Robi Dewolfe: [name] Other Clinician: Referring Makeisha Jentsch: Treating Lanesha Azzaro/Extender: [name] in Treatment: 58 Wound Status Wound Number: 1 Primary Etiology: Pressure Ulcer Wound Location: Right Calcaneus Wound Status: Open Wounding Event: Pressure Injury Comorbid History: Asthma, Angina, Hypertension Date Acquired: 10/07/2019 Weeks Of Treatment: 58 Clustered Wound: No Photos Wound Measurements Length: (cm) 3 Width: (cm) 1 Depth: (cm) 0.4 Area: (cm) 2.356 Volume: (cm) 0.942 % Reduction in Area: 92.5% % Reduction in Volume: 97% Epithelialization: Small (1-33%) Tunneling: No Undermining: No Wound Description Classification: Category/Stage III Wound Margin: Thickened Exudate  Amount: Medium Exudate Type: Serosanguineous Exudate Color: red, brown Foul Odor After Cleansing: No Slough/Fibrino No Wound Bed Granulation Amount: Large (67-100%) Exposed Structure Granulation Quality: Red, Pink Fascia Exposed: No Necrotic Amount: Small (1-33%) Fat Layer (Subcutaneous Tissue) Exposed: Yes Necrotic Quality: Adherent Slough Tendon Exposed: No Muscle Exposed: No Joint Exposed: No Bone Exposed: No Assessment Notes callous Treatment Notes Wound #1 (Calcaneus) Wound Laterality: Right Cleanser Normal  Saline Discharge Instruction: Cleanse the wound with Normal Saline prior to applying a clean dressing using gauze sponges, not tissue or cotton balls. Wound Cleanser Discharge Instruction: Cleanse the wound with wound cleanser prior to applying a clean dressing using gauze sponges, not tissue or cotton balls. Peri-Wound Care Topical Primary Dressing Promogran Prisma Matrix, 4.34 (sq in) (silver collagen) Discharge Instruction: Moisten collagen with saline or hydrogel Secondary Dressing Woven Gauze Sponge, Non-Sterile 4x4 in Discharge Instruction: Apply over primary dressing as directed. Zetuvit Plus 4x8 in Discharge Instruction: Apply over primary dressing as directed. Secured With Elastic Bandage 4 inch (ACE bandage) Discharge Instruction: Secure with ACE bandage as directed. Kerlix Roll Sterile, 4.5x3.1 (in/yd) Discharge Instruction: Secure with Kerlix as directed. Compression Wrap Compression Stockings Add-Ons Electronic Signature(s) Signed: 08/26/2021 5:47:38 PM By: [name] RN, BSN Entered By: [name] on 08/26/2021 10:33:34 -------------------------------------------------------------------------------- Wound Assessment Details Patient Name: Date of Service: [name], [place] 08/26/2021 9:45 A M Medical Record Number: [id number] Patient Account Number: [id number] Date of Birth/Sex: Treating RN: Apr 24, 1976 (46 y.o. [name], [name] Primary  Care Denyla Cortese: [name] Other Clinician: Referring Clariece Roesler: Treating Ipek Westra/Extender: [name] in Treatment: 58 Wound Status Wound Number: 2 Primary Etiology: Pressure Ulcer Wound Location: Left Calcaneus Wound Status: Open Wounding Event: Pressure Injury Comorbid History: Asthma, Angina, Hypertension Date Acquired: 10/07/2019 Weeks Of Treatment: 58 Clustered Wound: No Photos Wound Measurements Length: (cm) 2 Width: (cm) 0.7 Depth: (cm) 0.6 Area: (cm) 1.1 Volume: (cm) 0.66 % Reduction in Area: 95.9% % Reduction in Volume: 97.6% Epithelialization: Small (1-33%) Tunneling: No Undermining: No Wound Description Classification: Category/Stage III Wound Margin: Thickened Exudate Amount: Medium Exudate Type: Serosanguineous Exudate Color: red, brown Foul Odor After Cleansing: No Slough/Fibrino No Wound Bed Granulation Amount: Large (67-100%) Exposed Structure Granulation Quality: Red Fascia Exposed: No Necrotic Amount: Small (1-33%) Fat Layer (Subcutaneous Tissue) Exposed: Yes Necrotic Quality: Adherent Slough Tendon Exposed: No Muscle Exposed: No Joint Exposed: No Bone Exposed: No Treatment Notes Wound #2 (Calcaneus) Wound Laterality: Left Cleanser Normal Saline Discharge Instruction: Cleanse the wound with Normal Saline prior to applying a clean dressing using gauze sponges, not tissue or cotton balls. Wound Cleanser Discharge Instruction: Cleanse the wound with wound cleanser prior to applying a clean dressing using gauze sponges, not tissue or cotton balls. Peri-Wound Care Topical Primary Dressing Promogran Prisma Matrix, 4.34 (sq in) (silver collagen) Discharge Instruction: Moisten collagen with saline or hydrogel Secondary Dressing Woven Gauze Sponge, Non-Sterile 4x4 in Discharge Instruction: Apply over primary dressing as directed. Zetuvit Plus 4x8 in Discharge Instruction: Apply over primary dressing as directed. Secured  With Elastic Bandage 4 inch (ACE bandage) Discharge Instruction: Secure with ACE bandage as directed. Kerlix Roll Sterile, 4.5x3.1 (in/yd) Discharge Instruction: Secure with Kerlix as directed. Compression Wrap Compression Stockings Add-Ons Electronic Signature(s) Signed: 08/26/2021 5:47:38 PM By: [name] RN, BSN Entered By: [name] on 08/26/2021 10:34:16 -------------------------------------------------------------------------------- Vitals Details Patient Name: Date of Service: [name], [place] 08/26/2021 9:45 A M Medical Record Number: [id number] Patient Account Number: [id number] Date of Birth/Sex: Treating RN: 1976-07-22 (46 y.o. [name] Primary Care Jourdin Connors: [name] Other Clinician: Referring Dazani Norby: Treating Edon Hoadley/Extender: [name] in Treatment: 58 Vital Signs Time Taken: 10:16 Temperature (F): 98.5 Height (in): 69 Pulse (bpm): 80 Weight (lbs): 280 Respiratory Rate (breaths/min): 17 Body Mass Index (BMI): 41.3 Blood Pressure (mmHg): 148/82 Reference Range: 80 - 120 mg / dl Electronic Signature(s) Signed: 08/26/2021 11:34:21 AM By: Sandre Kitty Signed:  08/26/2021 11:34:21 AM By: Sandre Kitty Entered By: Sandre Kitty on 08/26/2021 10:16:59

## 2021-08-26 NOTE — Progress Notes (Signed)
DORRANCE, SELLICK ([id number]) Visit Report for 08/26/2021 Debridement Details Patient Name: Date of Service: [name], [name] 08/26/2021 9:45 A M Medical Record Number: [id number] Patient Account Number: [id number] Date of Birth/Sex: Treating RN: 1976/01/31 (46 y.o. [name], [name] Primary Care Provider: [name] Other Clinician: Referring Provider: Treating Provider/Extender: [name] in Treatment: 58 Debridement Performed for Assessment: Wound #1 Right Calcaneus Performed By: Physician Ricard Dillon., MD Debridement Type: Debridement Level of Consciousness (Pre-procedure): Awake and Alert Pre-procedure Verification/Time Out Yes - 10:45 Taken: Start Time: 10:46 Pain Control: Lidocaine 5% topical ointment T Area Debrided (L x W): otal 3 (cm) x 2 (cm) = 6 (cm) Tissue and other material debrided: Viable, Non-Viable, Callus, Subcutaneous, Skin: Dermis , Skin: Epidermis Level: Skin/Subcutaneous Tissue Debridement Description: Excisional Instrument: Curette Bleeding: Minimum Hemostasis Achieved: Pressure End Time: 10:50 Procedural Pain: 0 Post Procedural Pain: 0 Response to Treatment: Procedure was tolerated well Level of Consciousness (Post- Awake and Alert procedure): Post Debridement Measurements of Total Wound Length: (cm) 3 Stage: Category/Stage III Width: (cm) 1 Depth: (cm) 0.4 Volume: (cm) 0.942 Character of Wound/Ulcer Post Debridement: Improved Post Procedure Diagnosis Same as Pre-procedure Electronic Signature(s) Signed: 08/26/2021 4:44:32 PM By: Linton Ham MD Signed: 08/26/2021 5:47:38 PM By: [name] RN, BSN Entered By: Linton Ham on 08/26/2021 11:30:39 -------------------------------------------------------------------------------- HPI Details Patient Name: Date of Service: [name], [name] 08/26/2021 9:45 A M Medical Record Number: [id number] Patient Account Number: [id number] Date of Birth/Sex: Treating  RN: 1976/02/12 (46 y.o. [name] Primary Care Provider: [name] Other Clinician: Referring Provider: Treating Provider/Extender: [name] in Treatment: 76 History of Present Illness HPI Description: Wounds are12/03/2020 upon evaluation today patient presents for initial inspection here in our clinic concerning issues he has been having with the bottoms of his feet bilaterally. He states these actually occurred as wounds when he was hospitalized for 5 months secondary to Covid. He was apparently with tilting bed where he was in an upright position quite frequently and apparently this occurred in some way shape or form during that time. Fortunately there is no sign of active infection at this time. No fevers, chills, nausea, vomiting, or diarrhea. With that being said he still has substantial wounds on the plantar aspects of his feet Theragen require quite a bit of work to get these to heal. He has been using Santyl currently though that is been problematic both in receiving the medication as well as actually paid for it as it is become quite expensive. Prior to the experience with Covid the patient really did not have any major medical problems other than hypertension he does have some mild generalized weakness following the Covid experience. 07/22/2020 on evaluation today patient appears to be doing okay in regard to his foot ulcers I feel like the wound beds are showing signs of better improvement that I do believe the Iodoflex is helping in this regard. With that being said he does have a lot of drainage currently and this is somewhat blue/green in nature which is consistent with Pseudomonas. I do think a culture today would be appropriate for [place] to evaluate and see if that is indeed the case I would likely start him on antibiotic orally as well he is not allergic to Cipro knows of no issues he has had in the past 12/21; patient was admitted to the clinic  earlier this month with bilateral presumed pressure ulcers on the bottom of his feet  apparently related to excessive pressure from a tilt table arrangement in the intensive care unit. Patient relates this to being on ECMO but I am not really sure that is exactly related to that. I must say I have never seen anything like this. He has fairly extensive full-thickness wounds extending from his heel towards his midfoot mostly centered laterally. There is already been some healing distally. He does not appear to have an arterial issue. He has been using gentamicin to the wound surfaces with Iodoflex to help with ongoing debridement 1/6; this is a patient with pressure ulcers on the bottom of his feet related to excessive pressure from a standing position in the intensive care unit. He is complaining of a lot of pain in the right heel. He is not a diabetic. 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 We have been using Iodoflex to help prepare the surfaces of both wounds for an advanced treatment product. He is nonambulatory spending most of his time in a wheelchair I have asked him not to propel the wheelchair with his heels 1/13; in general his wounds look better not much surface area change we have been using Iodoflex as of last week. I did an x-ray of the right heel as the patient was complaining of pain. I had some thoughts about a stress fracture perhaps Achilles tendon problems however what it showed was erosive changes along the inferior aspect of the calcaneus he now has a MRI booked for 1/20. 1/20; in general his wounds continue to be better. Some improvement in the large narrow areas proximally in his foot. He is still complaining of pain in the right heel and tenderness in certain areas of this wound. His MRI is tonight. I am not just looking for osteomyelitis that was brought up on the x-ray I am wondering about stress fractures, tendon ruptures etc. He has no such findings  on the left. Also noteworthy is that the patient had critical illness neuropathy and some of the discomfort may be actual improvement in nerve function I am just not sure. These wounds were initially in the setting of severe critical illness related to COVID-19. He was put in a standing position. He may have also been on pressors at the point contributing to tissue ischemia. By his description at some point these wounds were grossly necrotic extending proximally up into the Achilles part of his heel. I do not know that I have ever really seen pictures of them like this although they may exist in epic We have ordered Tri layer Oasis. I am trying to stimulate some granulation in these areas. This is of course assuming the MRI is negative for infection 1/27; since the patient was last here he saw Dr. Juleen China of infectious disease. He is planned for vancomycin and ceftriaxone. Prior operative culture grew MSSA. Also ordered baseline lab work. He also ordered arterial studies although the ABIs in our clinic were normal as well as his clinical exam these were normal I do not think he needs to see vascular surgery. His ABIs at the PTA were 1.22 in the right triphasic waveforms with a normal TBI of 1.15 on the left ABI of 1.22 with triphasic waveforms and a normal TBI of 1.08. Finally he saw Dr. Amalia Hailey who will follow him in for 2 months. At this point I do not think he felt that he needed a procedure on the right calcaneal bone. Dr. Juleen China is elected for broad-spectrum antibiotic The patient is  still having pain in the right heel. He walks with a walker 2/3; wounds are generally smaller. He is tolerating his IV antibiotics. I believe this is vancomycin and ceftriaxone. We are still waiting for Oasis burn in terms of his out-of-pocket max which he should be meeting soon given the IV antibiotics, MRIs etc. I have asked him to check in on this. We are using silver collagen in the meantime the wounds look  better 2/10; tolerating IV vancomycin and Rocephin. We are waiting to apply for Oasis. Although I am not really sure where he is in his out-of-pocket max. 2/17 started the first application of Oasis trilayer. Still on antibiotics. The wounds have generally look better. The area on the left has a little more surface slough requiring debridement 0/16; second application of Oasis trilayer. The wound surface granulation is generally look better. The area on the left with undermining laterally I think is come in a bit. 10/08/2020 upon evaluation today patient is here today for [employer] application #3. Fortunately he seems to be doing extremely well with regard to this and we are seeing a lot of new epithelial growth which is great news. Fortunately there is no signs of active infection at this time. 10/16/2020 upon evaluation today patient appears to be doing well with regard to his foot ulcers. Do believe the Oasis has been of benefit for him. I do not see any signs of infection right now which is great news and I think that he has a lot of new epithelial growth which is great to see as well. The patient is very pleased to hear all of this. I do think we can proceed with the Oasis trilayer #4 today. 3/18; not as much improvement in these areas on his heels that I was hoping. I did reapply trilateral Oasis today the tissue looks healthier but not as much fill in as I was hoping. 3/25; better looking today I think this is come in a bit the tissue looks healthier. Triple layer Oasis reapplied #6 4/1; somewhat better looking definitely better looking surface not as much change in surface area as I was hoping. He may be spending more time [place] on days then he needs to although he does have heel offloading boots. Triple layer Oasis reapplied #7 4/7; unfortunately apparently [facility] will not approve any further Oasis which is unfortunate since the patient did respond nicely both in terms of  the condition of the wound bed as well as surface area. There is still some drainage coming from the wound but not a lot there does not appear to be any infection 4/15; we have been using Hydrofera Blue. He continues to have nice rims of epithelialization on the right greater than the left. The left the epithelialization is coming from the tip of his heel. There is moderate drainage. In this that concerns me about a total contact cast. There is no evidence of infection 4/29; patient has been using Hydrofera Blue with dressing changes. He has no complaints or issues today. 5/5; using Hydrofera Blue. I actually think that he looks marginally better than the last time I saw this 3 weeks ago. There are rims of epithelialization on the left thumb coming from the medial side on the right. Using Hydrofera Blue 5/12; using Hydrofera Blue. These continue to make improvements in surface area. His drainage was not listed as severe I therefore went ahead and put a cast on the left foot. Right foot we will  continue to dress his previous 5/16; back for first total contact cast change. He did not tolerate this particularly well cast injury on the anterior tibia among other issues. Difficulty sleeping. I talked him about this in some detail and afterwards is elected to continue. I told him I would like to have a cast on for 3 weeks to see if this is going to help at all. I think he agreed 5/19; I think the wound is better. There is no tunneling towards his midfoot. The undermining medially also looks better. He has a rim of new skin distally. I think we are making progress here. The area on the left also continues to look somewhat better to me using Hydrofera Blue. He has a list of complaints about the cast but none of them seem serious 5/26; patient presents for 1 week follow-up. He has been using a total contact cast and tolerating this well. Hydrofera Blue is the main dressing used. He denies signs of  infection. 6/2 Hydrofera Blue total contact cast on the left. These were large ulcers that formed in intensive care unit where the patient was recovering from [place]. May have had something to do with being ventilated in an upright positiono Pressors etc. We have been able to get the areas down considerably and a viable surface. There is some epithelialization in both sides. Note made of drainage 6/9; changed to [facility] last time because of drainage. He arrives with better looking surfaces and dimensions on the left than the right. Paradoxically the right actually probes more towards his midfoot the left is largely close down but both of these look improved. Using a total contact cast on the left 6/16; complex wounds on his bilateral plantar heels which were initially pressure injury from a stay in the ICU with COVID. We have been using silver alginate most recently. His dimensions of come in quite dramatically however not recently. We have been putting the left foot in a total contact cast 6/23; complex wounds on the bilateral plantar heels. I been putting the left in the cast paradoxically the area on the right is the one that is going towards closure at a faster rate. Quite a bit of drainage on the left. The patient went to see Dr. Amalia Hailey who said he was going to standby for skin grafts. I had actually considered sending him for skin grafts however he would be mandatorily off his feet for a period of weeks to months. I am thinking that the area on the right is going to close on its own the area on the left has been more stubborn even though we have him in a total contact cast 6/30; took him out of a total contact cast last week is the right heel seem to be making better progress than the left where I was placing the cast. We are using silver alginate. Both wounds are smaller right greater than left 7/12; both wounds look as though they are making some progress. We are using silver alginate.  Heel offloading boots 7/26; very gradual progress especially on the right. Using silver alginate. He is wearing heel offloading boots 8/18; he continues to close these wounds down very gradually. Using silver alginate. The problem polymen being definitive about this is areas of what appears to be callus around the margins. This is not a 100% of the area but certainly sizable especially on the right 9/1; bilateral plantar feet wounds secondary to prolonged pressure while being ventilated for COVID-19 in  an upright position. Essentially pressure ulcers on the bottom of his feet. He is made substantial progress using silver alginate. 9/14; bilateral plantar feet wounds secondary to prolonged pressure. Making progress using silver alginate. 9/29 bilateral plantar feet wounds secondary to prolonged pressure. I changed him to Iodoflex last week. MolecuLight showing reddened blush fluorescence 10/11; patient presents for follow-up. He has no issues or complaints today. He denies signs of infection. He continues to use Iodoflex and antibiotic ointment to the wound beds. 10/27; 2-week follow-up. No evidence of infection. He has callus and thick dry skin around the wound margins we have been using Iodoflex and Bactroban which was in response to a moderate left MolecuLight reddish blush fluorescence. 11/10; 2-week follow-up. Wound margins again have thick callus however the measurements of the actual wound sites are a lot smaller. Everything looks reasonably healthy here. We have been using Iodoflex He was approved for prime matrix but I have elected to delay this given the improvement in the surface area. Hopefully I will not regret that decision as were getting close to the end of the year in terms of insurance payment 12/8; 2-week follow-up. Wounds are generally smaller in size. These were initially substantial wounds extending into the forefoot all the way into the heel on the bilateral plantar feet. They  are now both located on the plantar heel distal aspect both of these have a lot of callus around the wounds I used a #5 curette to remove this on the right and the left also some subcutaneous debris to try and get the wound edges were using Iodoflex. He has heel offloading shoe 12/22; 2-week follow-up. Not really much improvement. He has thick callus around the outer edges of both wounds. I remove this there is some nonviable subcutaneous tissue as well. We have been using Iodoflex. Her intake nurse and myself spontaneously thought of a total contact cast I went back in May. At that time we really were not seeing much of an improvement with a cast although the wound was in a much different situation I would like to retry this in 2 weeks and I discussed this with the patient 08/12/2021; the patient has had some improvement with the Iodoflex. The the area on the left heel plantar more improved than the right. I had to put him in a total contact cast on the left although I decided to put that off for 2 weeks. I am going to change his primary dressing to silver collagen. I think in both areas he has had some improvement most of the healing seems to be more proximal in the heel. The wounds are in the mid aspect. A lot of thick callus on the right heel however. 1/19; we are using silver collagen on both plantar heel areas. He has had some improvement today. The left did not require any debridement. He still had some eschar on the right that was debrided but both seem to have contracted. I did not put it total contact cast on him today Electronic Signature(s) Signed: 08/26/2021 4:44:32 PM By: Linton Ham MD Entered By: Linton Ham on 08/26/2021 11:31:58 -------------------------------------------------------------------------------- Physical Exam Details Patient Name: Date of Service: [name], [place] 08/26/2021 9:45 A M Medical Record Number: [id number] Patient Account Number: [id number] Date of  Birth/Sex: Treating RN: [birth year] (46 y.o. [name] Primary Care Provider: [name] Other Clinician: Referring Provider: Treating Provider/Extender: [name] in Treatment: 77 Constitutional Patient is hypertensive.. Pulse regular and within  target range for patient.Marland Kitchen Respirations regular, non-labored and within target range.. Temperature is normal and within the target range for the patient.Marland Kitchen Appears in no distress. Notes Wound exam; bilateral plantar heel wounds. The area on the left appears to have a healthy surface there are rolled edges but the granulation has stated looks fairly good. I did not debride anything here. On the right heel he had significant eschar which I removed and got into some subcutaneous tissue. Nevertheless the remaining wounds here actually look fairly good. There is no evidence of surrounding infection He actually has no remaining plantar fat pad. This will mean this will heal with skin over bone which is not a good scenario for maintaining skin integrity Electronic Signature(s) Signed: 08/26/2021 4:44:32 PM By: Linton Ham MD Entered By: Linton Ham on 08/26/2021 11:33:18 -------------------------------------------------------------------------------- Physician Orders Details Patient Name: Date of Service: [name], [place] 08/26/2021 9:45 A M Medical Record Number: [id number] Patient Account Number: [id number] Date of Birth/Sex: Treating RN: March 28, 1976 (46 y.o. [name] Primary Care Provider: [name] Other Clinician: Referring Provider: Treating Provider/Extender: [name] in Treatment: 63 Verbal / Phone Orders: No Diagnosis Coding ICD-10 Coding Code Description [phone] Non-pressure chronic ulcer of other part of right foot with fat layer exposed L97.522 Non-pressure chronic ulcer of other 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 Follow-up Appointments ppointment in 2  weeks. - Dr. Dellia Nims Return A Loose pants next appt in case of a total contact cast. Bathing/ Shower/ Hygiene May shower with protection but do not get wound dressing(s) wet. Edema Control - Lymphedema / SCD / Other Bilateral Lower Extremities Elevate legs to the level of the heart or above for 30 minutes daily and/or when sitting, a frequency of: - throughout the day Avoid standing for long periods of time. Moisturize legs daily. - right leg and foot every night. Off-Loading Wedge shoe to: - Glophed Shoe to both feet. Other: - keep pressure off of the bottom of your feet Additional Orders / Instructions Follow Nutritious Diet Wound Treatment Wound #1 - Calcaneus Wound Laterality: Right Cleanser: Normal Saline (Generic) Every Other Day/30 Days Discharge Instructions: Cleanse the wound with Normal Saline prior to applying a clean dressing using gauze sponges, not tissue or cotton balls. Cleanser: Wound Cleanser Every Other Day/30 Days Discharge Instructions: Cleanse the wound with wound cleanser prior to applying a clean dressing using gauze sponges, not tissue or cotton balls. Prim Dressing: Promogran Prisma Matrix, 4.34 (sq in) (silver collagen) (Generic) Every Other Day/30 Days ary Discharge Instructions: Moisten collagen with saline or hydrogel Secondary Dressing: Woven Gauze Sponge, Non-Sterile 4x4 in (Generic) Every Other Day/30 Days Discharge Instructions: Apply over primary dressing as directed. Secondary Dressing: Zetuvit Plus 4x8 in (Generic) Every Other Day/30 Days Discharge Instructions: Apply over primary dressing as directed. Secured With: Elastic Bandage 4 inch (ACE bandage) (Generic) Every Other Day/30 Days Discharge Instructions: Secure with ACE bandage as directed. Secured With: [employer], 4.5x3.1 (in/yd) (Generic) Every Other Day/30 Days Discharge Instructions: Secure with Kerlix as directed. Wound #2 - Calcaneus Wound Laterality: Left Cleanser: Normal  Saline (Generic) Every Other Day/30 Days Discharge Instructions: Cleanse the wound with Normal Saline prior to applying a clean dressing using gauze sponges, not tissue or cotton balls. Cleanser: Wound Cleanser Every Other Day/30 Days Discharge Instructions: Cleanse the wound with wound cleanser prior to applying a clean dressing using gauze sponges, not tissue or cotton balls. Prim Dressing: Promogran Prisma Matrix, 4.34 (sq in) (silver  collagen) (Generic) Every Other Day/30 Days ary Discharge Instructions: Moisten collagen with saline or hydrogel Secondary Dressing: Woven Gauze Sponge, Non-Sterile 4x4 in (Generic) Every Other Day/30 Days Discharge Instructions: Apply over primary dressing as directed. Secondary Dressing: Zetuvit Plus 4x8 in (Generic) Every Other Day/30 Days Discharge Instructions: Apply over primary dressing as directed. Secured With: Elastic Bandage 4 inch (ACE bandage) (Generic) Every Other Day/30 Days Discharge Instructions: Secure with ACE bandage as directed. Secured With: [employer], 4.5x3.1 (in/yd) (Generic) Every Other Day/30 Days Discharge Instructions: Secure with Kerlix as directed. Electronic Signature(s) Signed: 08/26/2021 4:44:32 PM By: Linton Ham MD Signed: 08/26/2021 5:47:38 PM By: [name] RN, BSN Entered By: [name] on 08/26/2021 10:54:30 -------------------------------------------------------------------------------- Problem List Details Patient Name: Date of Service: [name], [place] 08/26/2021 9:45 A M Medical Record Number: [id number] Patient Account Number: [id number] Date of Birth/Sex: Treating RN: Oct 13, 1975 (46 y.o. [name] Primary Care Provider: [name] Other Clinician: Referring Provider: Treating Provider/Extender: [name] in Treatment: 69 Active Problems ICD-10 Encounter Code Description Active Date MDM Diagnosis [phone] Non-pressure chronic ulcer of other part of right  foot with fat layer exposed 09/03/2020 No Yes L97.522 Non-pressure chronic ulcer of other 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 09/03/2020 No Yes Inactive Problems ICD-10 Code Description Active Date Inactive Date L89.893 Pressure ulcer of other site, stage 3 07/15/2020 07/15/2020 M62.81 Muscle weakness (generalized) 07/15/2020 07/15/2020 I10 Essential (primary) hypertension 07/15/2020 07/15/2020 M86.171 Other acute osteomyelitis, right ankle and foot 09/03/2020 09/03/2020 Resolved Problems Electronic Signature(s) Signed: 08/26/2021 4:44:32 PM By: Linton Ham MD Entered By: Linton Ham on 08/26/2021 11:30:11 -------------------------------------------------------------------------------- Progress Note Details Patient Name: Date of Service: [name], [place] 08/26/2021 9:45 A M Medical Record Number: [id number] Patient Account Number: [id number] Date of Birth/Sex: Treating RN: March 30, 1976 (46 y.o. [name] Primary Care Provider: [name] Other Clinician: Referring Provider: Treating Provider/Extender: [name] in Treatment: 58 Subjective History of Present Illness (HPI) Wounds are12/03/2020 upon evaluation today patient presents for initial inspection here in our clinic concerning issues he has been having with the bottoms of his feet bilaterally. He states these actually occurred as wounds when he was hospitalized for 5 months secondary to Covid. He was apparently with tilting bed where he was in an upright position quite frequently and apparently this occurred in some way shape or form during that time. Fortunately there is no sign of active infection at this time. No fevers, chills, nausea, vomiting, or diarrhea. With that being said he still has substantial wounds on the plantar aspects of his feet Theragen require quite a bit of work to get these to heal. He has been using Santyl currently though that is been problematic both in receiving  the medication as well as actually paid for it as it is become quite expensive. Prior to the experience with Covid the patient really did not have any major medical problems other than hypertension he does have some mild generalized weakness following the Covid experience. 07/22/2020 on evaluation today patient appears to be doing okay in regard to his foot ulcers I feel like the wound beds are showing signs of better improvement that I do believe the Iodoflex is helping in this regard. With that being said he does have a lot of drainage currently and this is somewhat blue/green in nature which is consistent with Pseudomonas. I do think a culture today would be appropriate for [place] to evaluate and see  if that is indeed the case I would likely start him on antibiotic orally as well he is not allergic to Cipro knows of no issues he has had in the past 12/21; patient was admitted to the clinic earlier this month with bilateral presumed pressure ulcers on the bottom of his feet apparently related to excessive pressure from a tilt table arrangement in the intensive care unit. Patient relates this to being on ECMO but I am not really sure that is exactly related to that. I must say I have never seen anything like this. He has fairly extensive full-thickness wounds extending from his heel towards his midfoot mostly centered laterally. There is already been some healing distally. He does not appear to have an arterial issue. He has been using gentamicin to the wound surfaces with Iodoflex to help with ongoing debridement 1/6; this is a patient with pressure ulcers on the bottom of his feet related to excessive pressure from a standing position in the intensive care unit. He is complaining of a lot of pain in the right heel. He is not a diabetic. 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 We have been using Iodoflex to help prepare the surfaces of both wounds for an advanced treatment  product. He is nonambulatory spending most of his time in a wheelchair I have asked him not to propel the wheelchair with his heels 1/13; in general his wounds look better not much surface area change we have been using Iodoflex as of last week. I did an x-ray of the right heel as the patient was complaining of pain. I had some thoughts about a stress fracture perhaps Achilles tendon problems however what it showed was erosive changes along the inferior aspect of the calcaneus he now has a MRI booked for 1/20. 1/20; in general his wounds continue to be better. Some improvement in the large narrow areas proximally in his foot. He is still complaining of pain in the right heel and tenderness in certain areas of this wound. His MRI is tonight. I am not just looking for osteomyelitis that was brought up on the x-ray I am wondering about stress fractures, tendon ruptures etc. He has no such findings on the left. Also noteworthy is that the patient had critical illness neuropathy and some of the discomfort may be actual improvement in nerve function I am just not sure. These wounds were initially in the setting of severe critical illness related to COVID-19. He was put in a standing position. He may have also been on pressors at the point contributing to tissue ischemia. By his description at some point these wounds were grossly necrotic extending proximally up into the Achilles part of his heel. I do not know that I have ever really seen pictures of them like this although they may exist in epic We have ordered Tri layer Oasis. I am trying to stimulate some granulation in these areas. This is of course assuming the MRI is negative for infection 1/27; since the patient was last here he saw Dr. Juleen China of infectious disease. He is planned for vancomycin and ceftriaxone. Prior operative culture grew MSSA. Also ordered baseline lab work. He also ordered arterial studies although the ABIs in our clinic were  normal as well as his clinical exam these were normal I do not think he needs to see vascular surgery. His ABIs at the [facility] were 1.22 in the right triphasic waveforms with a normal TBI of 1.15 on the left  ABI of 1.22 with triphasic waveforms and a normal TBI of 1.08. Finally he saw Dr. Amalia Hailey who will follow him in for 2 months. At this point I do not think he felt that he needed a procedure on the right calcaneal bone. Dr. Juleen China is elected for broad-spectrum antibiotic The patient is still having pain in the right heel. He walks with a walker 2/3; wounds are generally smaller. He is tolerating his IV antibiotics. I believe this is vancomycin and ceftriaxone. We are still waiting for Oasis burn in terms of his out-of-pocket max which he should be meeting soon given the IV antibiotics, MRIs etc. I have asked him to check in on this. We are using silver collagen in the meantime the wounds look better 2/10; tolerating IV vancomycin and Rocephin. We are waiting to apply for Oasis. Although I am not really sure where he is in his out-of-pocket max. 2/17 started the first application of Oasis trilayer. Still on antibiotics. The wounds have generally look better. The area on the left has a little more surface slough requiring debridement 3/53; second application of Oasis trilayer. The wound surface granulation is generally look better. The area on the left with undermining laterally I think is come in a bit. 10/08/2020 upon evaluation today patient is here today for [employer] application #3. Fortunately he seems to be doing extremely well with regard to this and we are seeing a lot of new epithelial growth which is great news. Fortunately there is no signs of active infection at this time. 10/16/2020 upon evaluation today patient appears to be doing well with regard to his foot ulcers. Do believe the Oasis has been of benefit for him. I do not see any signs of infection right now which is great news and  I think that he has a lot of new epithelial growth which is great to see as well. The patient is very pleased to hear all of this. I do think we can proceed with the Oasis trilayer #4 today. 3/18; not as much improvement in these areas on his heels that I was hoping. I did reapply trilateral Oasis today the tissue looks healthier but not as much fill in as I was hoping. 3/25; better looking today I think this is come in a bit the tissue looks healthier. Triple layer Oasis reapplied #6 4/1; somewhat better looking definitely better looking surface not as much change in surface area as I was hoping. He may be spending more time [place] on days then he needs to although he does have heel offloading boots. Triple layer Oasis reapplied #7 4/7; unfortunately apparently [facility] will not approve any further Oasis which is unfortunate since the patient did respond nicely both in terms of the condition of the wound bed as well as surface area. There is still some drainage coming from the wound but not a lot there does not appear to be any infection 4/15; we have been using Hydrofera Blue. He continues to have nice rims of epithelialization on the right greater than the left. The left the epithelialization is coming from the tip of his heel. There is moderate drainage. In this that concerns me about a total contact cast. There is no evidence of infection 4/29; patient has been using Hydrofera Blue with dressing changes. He has no complaints or issues today. 5/5; using Hydrofera Blue. I actually think that he looks marginally better than the last time I saw this 3 weeks ago. There are  rims of epithelialization on the left thumb coming from the medial side on the right. Using Hydrofera Blue 5/12; using Hydrofera Blue. These continue to make improvements in surface area. His drainage was not listed as severe I therefore went ahead and put a cast on the left foot. Right foot we will continue to dress  his previous 5/16; back for first total contact cast change. He did not tolerate this particularly well cast injury on the anterior tibia among other issues. Difficulty sleeping. I talked him about this in some detail and afterwards is elected to continue. I told him I would like to have a cast on for 3 weeks to see if this is going to help at all. I think he agreed 5/19; I think the wound is better. There is no tunneling towards his midfoot. The undermining medially also looks better. He has a rim of new skin distally. I think we are making progress here. The area on the left also continues to look somewhat better to me using Hydrofera Blue. He has a list of complaints about the cast but none of them seem serious 5/26; patient presents for 1 week follow-up. He has been using a total contact cast and tolerating this well. Hydrofera Blue is the main dressing used. He denies signs of infection. 6/2 Hydrofera Blue total contact cast on the left. These were large ulcers that formed in intensive care unit where the patient was recovering from [place]. May have had something to do with being ventilated in an upright positiono Pressors etc. We have been able to get the areas down considerably and a viable surface. There is some epithelialization in both sides. Note made of drainage 6/9; changed to [facility] last time because of drainage. He arrives with better looking surfaces and dimensions on the left than the right. Paradoxically the right actually probes more towards his midfoot the left is largely close down but both of these look improved. Using a total contact cast on the left 6/16; complex wounds on his bilateral plantar heels which were initially pressure injury from a stay in the ICU with COVID. We have been using silver alginate most recently. His dimensions of come in quite dramatically however not recently. We have been putting the left foot in a total contact cast 6/23; complex wounds on  the bilateral plantar heels. I been putting the left in the cast paradoxically the area on the right is the one that is going towards closure at a faster rate. Quite a bit of drainage on the left. The patient went to see Dr. Amalia Hailey who said he was going to standby for skin grafts. I had actually considered sending him for skin grafts however he would be mandatorily off his feet for a period of weeks to months. I am thinking that the area on the right is going to close on its own the area on the left has been more stubborn even though we have him in a total contact cast 6/30; took him out of a total contact cast last week is the right heel seem to be making better progress than the left where I was placing the cast. We are using silver alginate. Both wounds are smaller right greater than left 7/12; both wounds look as though they are making some progress. We are using silver alginate. Heel offloading boots 7/26; very gradual progress especially on the right. Using silver alginate. He is wearing heel offloading boots 8/18; he continues to close these wounds down  very gradually. Using silver alginate. The problem polymen being definitive about this is areas of what appears to be callus around the margins. This is not a 100% of the area but certainly sizable especially on the right 9/1; bilateral plantar feet wounds secondary to prolonged pressure while being ventilated for COVID-19 in an upright position. Essentially pressure ulcers on the bottom of his feet. He is made substantial progress using silver alginate. 9/14; bilateral plantar feet wounds secondary to prolonged pressure. Making progress using silver alginate. 9/29 bilateral plantar feet wounds secondary to prolonged pressure. I changed him to Iodoflex last week. MolecuLight showing reddened blush fluorescence 10/11; patient presents for follow-up. He has no issues or complaints today. He denies signs of infection. He continues to use Iodoflex  and antibiotic ointment to the wound beds. 10/27; 2-week follow-up. No evidence of infection. He has callus and thick dry skin around the wound margins we have been using Iodoflex and Bactroban which was in response to a moderate left MolecuLight reddish blush fluorescence. 11/10; 2-week follow-up. Wound margins again have thick callus however the measurements of the actual wound sites are a lot smaller. Everything looks reasonably healthy here. We have been using Iodoflex He was approved for prime matrix but I have elected to delay this given the improvement in the surface area. Hopefully I will not regret that decision as were getting close to the end of the year in terms of insurance payment 12/8; 2-week follow-up. Wounds are generally smaller in size. These were initially substantial wounds extending into the forefoot all the way into the heel on the bilateral plantar feet. They are now both located on the plantar heel distal aspect both of these have a lot of callus around the wounds I used a #5 curette to remove this on the right and the left also some subcutaneous debris to try and get the wound edges were using Iodoflex. He has heel offloading shoe 12/22; 2-week follow-up. Not really much improvement. He has thick callus around the outer edges of both wounds. I remove this there is some nonviable subcutaneous tissue as well. We have been using Iodoflex. Her intake nurse and myself spontaneously thought of a total contact cast I went back in May. At that time we really were not seeing much of an improvement with a cast although the wound was in a much different situation I would like to retry this in 2 weeks and I discussed this with the patient 08/12/2021; the patient has had some improvement with the Iodoflex. The the area on the left heel plantar more improved than the right. I had to put him in a total contact cast on the left although I decided to put that off for 2 weeks. I am going to  change his primary dressing to silver collagen. I think in both areas he has had some improvement most of the healing seems to be more proximal in the heel. The wounds are in the mid aspect. A lot of thick callus on the right heel however. 1/19; we are using silver collagen on both plantar heel areas. He has had some improvement today. The left did not require any debridement. He still had some eschar on the right that was debrided but both seem to have contracted. I did not put it total contact cast on him today Objective Constitutional Patient is hypertensive.. Pulse regular and within target range for patient.Marland Kitchen Respirations regular, non-labored and within target range.. Temperature is normal and within the target range  for the patient.Marland Kitchen Appears in no distress. Vitals Time Taken: 10:16 AM, Height: 69 in, Weight: 280 lbs, BMI: 41.3, Temperature: 98.5 F, Pulse: 80 bpm, Respiratory Rate: 17 breaths/min, Blood Pressure: 148/82 mmHg. General Notes: Wound exam; bilateral plantar heel wounds. The area on the left appears to have a healthy surface there are rolled edges but the granulation has stated looks fairly good. I did not debride anything here. oo On the right heel he had significant eschar which I removed and got into some subcutaneous tissue. Nevertheless the remaining wounds here actually look fairly good. There is no evidence of surrounding infection oo He actually has no remaining plantar fat pad. This will mean this will heal with skin over bone which is not a good scenario for maintaining skin integrity Integumentary (Hair, Skin) Wound #1 status is Open. Original cause of wound was Pressure Injury. The date acquired was: 10/07/2019. The wound has been in treatment 58 weeks. The wound is located on the Right Calcaneus. The wound measures 3cm length x 1cm width x 0.4cm depth; 2.356cm^2 area and 0.942cm^3 volume. There is Fat Layer (Subcutaneous Tissue) exposed. There is no tunneling or  undermining noted. There is a medium amount of serosanguineous drainage noted. The wound margin is thickened. There is large (67-100%) red, pink granulation within the wound bed. There is a small (1-33%) amount of necrotic tissue within the wound bed including Adherent Slough. General Notes: callous Wound #2 status is Open. Original cause of wound was Pressure Injury. The date acquired was: 10/07/2019. The wound has been in treatment 58 weeks. The wound is located on the Left Calcaneus. The wound measures 2cm length x 0.7cm width x 0.6cm depth; 1.1cm^2 area and 0.66cm^3 volume. There is Fat Layer (Subcutaneous Tissue) exposed. There is no tunneling or undermining noted. There is a medium amount of serosanguineous drainage noted. The wound margin is thickened. There is large (67-100%) red granulation within the wound bed. There is a small (1-33%) amount of necrotic tissue within the wound bed including Adherent Slough. Assessment Active Problems ICD-10 Non-pressure chronic ulcer of other part of right foot with fat layer exposed Non-pressure chronic ulcer of other 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 Procedures Wound #1 Pre-procedure diagnosis of Wound #1 is a Pressure Ulcer located on the Right Calcaneus . There was a Excisional Skin/Subcutaneous Tissue Debridement with a total area of 6 sq cm performed by Ricard Dillon., MD. With the following instrument(s): Curette to remove Viable and Non-Viable tissue/material. Material removed includes Callus, Subcutaneous Tissue, Skin: Dermis, and Skin: Epidermis after achieving pain control using Lidocaine 5% topical ointment. A time out was conducted at 10:45, prior to the start of the procedure. A Minimum amount of bleeding was controlled with Pressure. The procedure was tolerated well with a pain level of 0 throughout and a pain level of 0 following the procedure. Post Debridement Measurements: 3cm length x 1cm width x 0.4cm depth; 0.942cm^3  volume. Post debridement Stage noted as Category/Stage III. Character of Wound/Ulcer Post Debridement is improved. Post procedure Diagnosis Wound #1: Same as Pre-Procedure Plan Follow-up Appointments: Return Appointment in 2 weeks. - Dr. Dellia Nims Loose pants next appt in case of a total contact cast. Bathing/ Shower/ Hygiene: May shower with protection but do not get wound dressing(s) wet. Edema Control - Lymphedema / SCD / Other: Elevate legs to the level of the heart or above for 30 minutes daily and/or when sitting, a frequency of: - throughout the day Avoid standing for long  periods of time. Moisturize legs daily. - right leg and foot every night. Off-Loading: Wedge shoe to: - Glophed Shoe to both feet. Other: - keep pressure off of the bottom of your feet Additional Orders / Instructions: Follow Nutritious Diet WOUND #1: - Calcaneus Wound Laterality: Right Cleanser: Normal Saline (Generic) Every Other Day/30 Days Discharge Instructions: Cleanse the wound with Normal Saline prior to applying a clean dressing using gauze sponges, not tissue or cotton balls. Cleanser: Wound Cleanser Every Other Day/30 Days Discharge Instructions: Cleanse the wound with wound cleanser prior to applying a clean dressing using gauze sponges, not tissue or cotton balls. Prim Dressing: Promogran Prisma Matrix, 4.34 (sq in) (silver collagen) (Generic) Every Other Day/30 Days ary Discharge Instructions: Moisten collagen with saline or hydrogel Secondary Dressing: Woven Gauze Sponge, Non-Sterile 4x4 in (Generic) Every Other Day/30 Days Discharge Instructions: Apply over primary dressing as directed. Secondary Dressing: Zetuvit Plus 4x8 in (Generic) Every Other Day/30 Days Discharge Instructions: Apply over primary dressing as directed. Secured With: Elastic Bandage 4 inch (ACE bandage) (Generic) Every Other Day/30 Days Discharge Instructions: Secure with ACE bandage as directed. Secured With: [employer], 4.5x3.1 (in/yd) (Generic) Every Other Day/30 Days Discharge Instructions: Secure with Kerlix as directed. WOUND #2: - Calcaneus Wound Laterality: Left Cleanser: Normal Saline (Generic) Every Other Day/30 Days Discharge Instructions: Cleanse the wound with Normal Saline prior to applying a clean dressing using gauze sponges, not tissue or cotton balls. Cleanser: Wound Cleanser Every Other Day/30 Days Discharge Instructions: Cleanse the wound with wound cleanser prior to applying a clean dressing using gauze sponges, not tissue or cotton balls. Prim Dressing: Promogran Prisma Matrix, 4.34 (sq in) (silver collagen) (Generic) Every Other Day/30 Days ary Discharge Instructions: Moisten collagen with saline or hydrogel Secondary Dressing: Woven Gauze Sponge, Non-Sterile 4x4 in (Generic) Every Other Day/30 Days Discharge Instructions: Apply over primary dressing as directed. Secondary Dressing: Zetuvit Plus 4x8 in (Generic) Every Other Day/30 Days Discharge Instructions: Apply over primary dressing as directed. Secured With: Elastic Bandage 4 inch (ACE bandage) (Generic) Every Other Day/30 Days Discharge Instructions: Secure with ACE bandage as directed. Secured With: [employer], 4.5x3.1 (in/yd) (Generic) Every Other Day/30 Days Discharge Instructions: Secure with Kerlix as directed. am continuing with the silver collagen 2. The wounds look as good as I have seen him and I would like to continue collagen as long as they are epithelializing and improving in surface area. 3. We did use Oasis on him at 1 time but I am hopeful that that will have to be considered again. 4. I did talk to him about heel offloading shoes in the eventuality that this actually closes. I might consider referring him to [facility] to see their shoe prosthetists. Other than that we would have to pad these areas rigorously Electronic Signature(s) Signed: 08/26/2021 4:44:32 PM By: Linton Ham  MD Entered By: Linton Ham on 08/26/2021 11:37:12 -------------------------------------------------------------------------------- SuperBill Details Patient Name: Date of Service: [name], [place]. 08/26/2021 Medical Record Number: [id number] Patient Account Number: [id number] Date of Birth/Sex: Treating RN: [birth year] (46 y.o. [name] Primary Care Provider: [name] Other Clinician: Referring Provider: Treating Provider/Extender: [name] in Treatment: 58 Diagnosis Coding ICD-10 Codes Code Description [phone] Non-pressure chronic ulcer of other part of right foot with fat layer exposed L97.522 Non-pressure chronic ulcer of other 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 Facility Procedures CPT4 Code: [id number] Description: 11042 - DEB SUBQ TISSUE 20 SQ CM/<  ICD-10 Diagnosis Description L97.512 Non-pressure chronic ulcer of other part of right foot with fat layer exposed Modifier: Quantity: 1 Physician Procedures : CPT4 Code Description Modifier [id number] 11042 - WC PHYS SUBQ TISS 20 SQ CM ICD-10 Diagnosis Description L97.512 Non-pressure chronic ulcer of other part of right foot with fat layer exposed Quantity: 1 Electronic Signature(s) Signed: 08/26/2021 4:44:32 PM By: Linton Ham MD Entered By: Linton Ham on 08/26/2021 11:39:01

## 2021-09-06 ENCOUNTER — Other Ambulatory Visit: Payer: Self-pay

## 2021-09-06 ENCOUNTER — Encounter: Payer: Self-pay | Admitting: Physical Medicine and Rehabilitation

## 2021-09-06 ENCOUNTER — Encounter
Payer: BC Managed Care – PPO | Attending: Physical Medicine and Rehabilitation | Admitting: Physical Medicine and Rehabilitation

## 2021-09-06 VITALS — BP 138/82 | HR 77 | Ht 68.0 in | Wt 300.0 lb

## 2021-09-06 DIAGNOSIS — U099 Post covid-19 condition, unspecified: Secondary | ICD-10-CM | POA: Insufficient documentation

## 2021-09-06 DIAGNOSIS — F329 Major depressive disorder, single episode, unspecified: Secondary | ICD-10-CM | POA: Diagnosis not present

## 2021-09-06 DIAGNOSIS — L89890 Pressure ulcer of other site, unstageable: Secondary | ICD-10-CM | POA: Insufficient documentation

## 2021-09-06 DIAGNOSIS — G894 Chronic pain syndrome: Secondary | ICD-10-CM | POA: Diagnosis not present

## 2021-09-06 DIAGNOSIS — Z993 Dependence on wheelchair: Secondary | ICD-10-CM | POA: Diagnosis not present

## 2021-09-06 DIAGNOSIS — M21372 Foot drop, left foot: Secondary | ICD-10-CM | POA: Insufficient documentation

## 2021-09-06 DIAGNOSIS — Z6841 Body Mass Index (BMI) 40.0 and over, adult: Secondary | ICD-10-CM | POA: Diagnosis not present

## 2021-09-06 DIAGNOSIS — G6281 Critical illness polyneuropathy: Secondary | ICD-10-CM | POA: Insufficient documentation

## 2021-09-06 HISTORY — DX: Foot drop, left foot: M21.372

## 2021-09-06 MED ORDER — HYDROCODONE-ACETAMINOPHEN 5-325 MG PO TABS: 1.0000 | ORAL_TABLET | Freq: Four times a day (QID) | ORAL | 0 refills | Status: DC | PRN

## 2021-09-06 NOTE — Patient Instructions (Addendum)
Pt is a 46 yr old male with COVID ICU myopathy, Long COVID,  s/p surgery on feet due to necrosis from long ICU stay/pressors to save life-s/p  skin grafts and R foot osteomyelitis as well. Also has L foot drop. Still w/c dependent- still cannot put weight on feet per Plastics.  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   2. Suggest acting like has diabetes-- cut down/off sodas. Needs to drink water and try flavor packets if needs something different.  - 1 change every 1-2 weeks- so avoid binge eating/drinking.   3.  When went into hospital with COVID, had dx of DM- A1c of 6.7- so will needs to get PCP to check HbA1c. Should be checked q3 months when has glucose intolerance. Has PCP appt/physical 10/13/21.    4.   At [facility]- suggest a therapist at [facility]-  I suggest this to help with emotional issues related to B?l foot drop/ICU myopathy  5. Will do therapy when able to bear weight  on feet/legs.  Wait for now.    6. When time to order healing shoes- will need to coordinate with L AFO possibly- might benefit from L foot up.  Will try that first before L AFO.    7. Send New [facility] to [facility] on [address] [facility]- 5/325 #60- no refills. Last rx was 05/06/21- so reasonable.    8. F/U in 3 months. Double visit.

## 2021-09-06 NOTE — Progress Notes (Addendum)
Subjective:    Patient ID: [name], male    DOB: 1976/05/09, 46 y.o.   MRN: [id number]  HPI Pt is a 46 yr old male with COVID ICU myopathy, Long COVID,  s/p surgery on feet due to necrosis from long ICU stay/pressors to save life-s/p  skin grafts and R foot osteomyelitis as well. Also has L foot drop. Still w/c dependent- still cannot put weight on feet per Plastics.  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   Last time took Norco was 3 weeks ago.   Stopped Abilify- is now on Lamictal 50 mg qAM-  Doesn't make sleepy.  Mood a little better- ~ 10% better than it was.  Still has bad days.   Doesn't help sitting around house all day.  Still taking Cymbalta 60 mg daily.    Disability got approved!- Starts next month- made it retroactive-  Not on Medicare insurance yet-   Needs FMLA paperwork done. Dropped by last week.   Is a surgery that possibly could be done for feet- another skin graft- had to be out of commission for 8 weeks straight- and feet are 90% healed.   Bought new digital scale-  299-300 lbs- showed 309 today- was 306 at last appt.   Eating habits and drinking juices hasn't been great.  A1c was 5.7- 1 year ago.   Hasn't seen therapist- forgot  Has new Neurologist-t Dr Jannifer Franklin retired [facility]   Stopped [employer]- insurance stooped it.  Upset about that.   Pain Inventory Average Pain 5 Pain Right Now 3 My pain is intermittent, sharp, and burning  In the last 24 hours, has pain interfered with the following? General activity 0 Relation with others 0 Enjoyment of life 0 What TIME of day is your pain at its worst? night Sleep (in general) Fair  Pain is worse with: some activites Pain improves with: medication Relief from Meds: 8  Family History  Problem Relation Age of Onset   Asthma Brother    Diabetes Father     Hypertension Father    Diabetes Paternal Grandmother    Hypertension Paternal Grandmother    Migraines Mother    GER disease Mother    Pancreatic cancer Maternal Grandmother    Heart disease Maternal Grandfather    Social History   Socioeconomic History   Marital status: Married    Spouse name: Not on file   Number of children: 1   Years of education: Not on file   Highest education level: Not on file  Occupational History   Occupation: delivery driver  Tobacco Use   Smoking status: Never   Smokeless tobacco: Never  Vaping Use   Vaping Use: Never used  Substance and Sexual Activity   Alcohol use: Yes    Comment: rarely 2-3 times a year   Drug use: No   Sexual activity: Yes    Partners: Female  Other Topics Concern   Not on file  Social History Narrative   Not on file   Social Determinants of Health   Financial Resource Strain: Not on file  Food Insecurity: Not on file  Transportation Needs: Not on file  Physical Activity: Not on file  Stress: Not on file  Social Connections: Not on file   Past Surgical History:  Procedure Laterality Date   CANNULATION FOR ECMO (EXTRACORPOREAL MEMBRANE OXYGENATION)  N/A 08/28/2019   Procedure: CANNULATION FOR VV ECMO (EXTRACORPOREAL MEMBRANE OXYGENATION);  Surgeon: Prescott Gum, Collier Salina, MD;  Location: [place];  Service: Open Heart Surgery;  Laterality: N/A;  CRESCENT CANNULA   CANNULATION FOR ECMO (EXTRACORPOREAL MEMBRANE OXYGENATION) N/A 09/10/2019   Procedure: CANNULATION FOR ECMO (EXTRACORPOREAL MEMBRANE OXYGENATION) PUTTING IN CRESCENT 32FR CANNULA  AND REMOVING GROING CANNULATION;  Surgeon: Wonda Olds, MD;  Location: [place];  Service: Open Heart Surgery;  Laterality: N/A;  PUTTING IN CRESCENT/REMOVING GROIN CANNULATION   CYSTOSCOPY/URETEROSCOPY/HOLMIUM LASER/STENT PLACEMENT Left 04/18/2019   Procedure: LEFT URETEROSCOPY/HOLMIUM LASER/STENT PLACEMENT;  Surgeon: Ardis Hughs, MD;  Location[facility];  Service:  Urology;  Laterality: Left;   CYSTOSCOPY/URETEROSCOPY/HOLMIUM LASER/STENT PLACEMENT Left 05/02/2019   Procedure: CYSTOSCOPY/URETEROSCOPY/HOLMIUM LASER/STENT EXCHANGE;  Surgeon: Ardis Hughs, MD;  [facility];  Service: Urology;  Laterality: Left;   ECMO CANNULATION N/A 08/03/2019   Procedure: ECMO CANNULATION;  Surgeon: Wonda Olds, MD;  Location: [place] [facility];  Service: Cardiovascular;  Laterality: N/A;   ESOPHAGOGASTRODUODENOSCOPY N/A 09/11/2019   Procedure: ESOPHAGOGASTRODUODENOSCOPY (EGD);  Surgeon: Wonda Olds, MD;  Location: [facility];  Service: Thoracic;  Laterality: N/A;   GRAFT APPLICATION Bilateral 08/10/7251   Procedure: APPLICATION OF SKIN GRAFT BILATERAL FEET;  Surgeon: Edrick Kins, DPM;  [facility];  Service: Podiatry;  Laterality: Bilateral;   IR REPLC GASTRO/COLONIC TUBE PERCUT W/FLUORO  10/14/2019   IRRIGATION AND DEBRIDEMENT SHOULDER Right 09/29/2017   Procedure: IRRIGATION AND DEBRIDEMENT SHOULDER;  Surgeon: Leandrew Koyanagi, MD;  Location: [place];  Service: Orthopedics;  Laterality: Right;   LUMBAR DISC SURGERY  2002   NASAL ENDOSCOPY WITH EPISTAXIS CONTROL N/A 08/31/2019   Procedure: NASAL ENDOSCOPY WITH EPISTAXIS CONTROL WITH CAUTERIZATION;  Surgeon: Melida Quitter, MD;  Location: [place];  Service: ENT;  Laterality: N/A;   PORTACATH PLACEMENT N/A 09/11/2019   Procedure: PEG TUBE INSERTION - BEDSIDE;  Surgeon: Wonda Olds, MD;  Location: [employer];  Service: Thoracic;  Laterality: N/A;   TEE WITHOUT CARDIOVERSION N/A 08/28/2019   Procedure: TRANSESOPHAGEAL ECHOCARDIOGRAM (TEE);  Surgeon: Prescott Gum, Collier Salina, MD;  Location: [place];  Service: Open Heart Surgery;  Laterality: N/A;   TEE WITHOUT CARDIOVERSION N/A 09/10/2019   Procedure: TRANSESOPHAGEAL ECHOCARDIOGRAM (TEE);  Surgeon: Wonda Olds, MD;  Location: [place];  Service: Open Heart Surgery;  Laterality: N/A;   WOUND DEBRIDEMENT Bilateral 03/27/2020   Procedure: EXCISIONAL DEBRIDEMENT OF ULCERS  BILATERAL FEET;  Surgeon: Edrick Kins, DPM;  [facility];  Service: Podiatry;  Laterality: Bilateral;   Past Surgical History:  Procedure Laterality Date   CANNULATION FOR ECMO (EXTRACORPOREAL MEMBRANE OXYGENATION) N/A 08/28/2019   Procedure: CANNULATION FOR VV ECMO (EXTRACORPOREAL MEMBRANE OXYGENATION);  Surgeon: Prescott Gum, Collier Salina, MD;  Location: [place];  Service: Open Heart Surgery;  Laterality: N/A;  CRESCENT CANNULA   CANNULATION FOR ECMO (EXTRACORPOREAL MEMBRANE OXYGENATION) N/A 09/10/2019   Procedure: CANNULATION FOR ECMO (EXTRACORPOREAL MEMBRANE OXYGENATION) PUTTING IN CRESCENT 32FR CANNULA  AND REMOVING GROING CANNULATION;  Surgeon: Wonda Olds, MD;  Location: [place];  Service: Open Heart Surgery;  Laterality: N/A;  PUTTING IN CRESCENT/REMOVING GROIN CANNULATION   CYSTOSCOPY/URETEROSCOPY/HOLMIUM LASER/STENT PLACEMENT Left 04/18/2019   Procedure: LEFT URETEROSCOPY/HOLMIUM LASER/STENT PLACEMENT;  Surgeon: Ardis Hughs, MD;  Location: [facility];  Service: Urology;  Laterality: Left;   CYSTOSCOPY/URETEROSCOPY/HOLMIUM LASER/STENT PLACEMENT Left 05/02/2019   Procedure: CYSTOSCOPY/URETEROSCOPY/HOLMIUM LASER/STENT EXCHANGE;  Surgeon: Ardis Hughs, MD;  [facility];  Service:  Urology;  Laterality: Left;   ECMO CANNULATION N/A 08/03/2019   Procedure: ECMO CANNULATION;  Surgeon: Wonda Olds, MD;  Location: [place] [facility];  Service: Cardiovascular;  Laterality: N/A;   ESOPHAGOGASTRODUODENOSCOPY N/A 09/11/2019   Procedure: ESOPHAGOGASTRODUODENOSCOPY (EGD);  Surgeon: Wonda Olds, MD;  Location: [facility];  Service: Thoracic;  Laterality: N/A;   GRAFT APPLICATION Bilateral 6/60/6301   Procedure: APPLICATION OF SKIN GRAFT BILATERAL FEET;  Surgeon: Edrick Kins, DPM;  [facility];  Service: Podiatry;  Laterality: Bilateral;   IR REPLC GASTRO/COLONIC TUBE PERCUT W/FLUORO  10/14/2019   IRRIGATION AND DEBRIDEMENT SHOULDER Right 09/29/2017   Procedure:  IRRIGATION AND DEBRIDEMENT SHOULDER;  Surgeon: Leandrew Koyanagi, MD;  Location: [place];  Service: Orthopedics;  Laterality: Right;   LUMBAR DISC SURGERY  2002   NASAL ENDOSCOPY WITH EPISTAXIS CONTROL N/A 08/31/2019   Procedure: NASAL ENDOSCOPY WITH EPISTAXIS CONTROL WITH CAUTERIZATION;  Surgeon: Melida Quitter, MD;  Location: [place];  Service: ENT;  Laterality: N/A;   PORTACATH PLACEMENT N/A 09/11/2019   Procedure: PEG TUBE INSERTION - BEDSIDE;  Surgeon: Wonda Olds, MD;  Location: [place];  Service: Thoracic;  Laterality: N/A;   TEE WITHOUT CARDIOVERSION N/A 08/28/2019   Procedure: TRANSESOPHAGEAL ECHOCARDIOGRAM (TEE);  Surgeon: Prescott Gum, Collier Salina, MD;  Location: [place];  Service: Open Heart Surgery;  Laterality: N/A;   TEE WITHOUT CARDIOVERSION N/A 09/10/2019   Procedure: TRANSESOPHAGEAL ECHOCARDIOGRAM (TEE);  Surgeon: Wonda Olds, MD;  Location: [place];  Service: Open Heart Surgery;  Laterality: N/A;   WOUND DEBRIDEMENT Bilateral 03/27/2020   Procedure: EXCISIONAL DEBRIDEMENT OF ULCERS BILATERAL FEET;  Surgeon: Edrick Kins, DPM;  [facility];  Service: Podiatry;  Laterality: Bilateral;   Past Medical History:  Diagnosis Date   Anginal pain ([place])    with covid   Anxiety    Asthma    Dyspnea    GERD (gastroesophageal reflux disease)    Headache    History of kidney stones    LEFT URETERAL STONE   HTN (hypertension)    Pancreatitis 2018   GALLBALDDER SLUDGE CAUSED ISSUED RESOLVED   Pneumonia 07/2019   covid   BP 138/82    Pulse 77    Ht 5' 8"  (1.727 m)    Wt 300 lb (136.1 kg)    SpO2 91%    BMI 45.61 kg/m   Opioid Risk Score:   Fall Risk Score:  `1  Depression screen PHQ 2/9  Depression screen [facility] 2/9 06/04/2021 03/03/2021 01/01/2021 11/04/2020 10/15/2020 09/09/2020 07/01/2020  Decreased Interest 1 1 0 1 0 1 0  Down, Depressed, Hopeless 1 1 0 1 1 1  0  PHQ - 2 Score 2 2 0 2 1 2  0  Altered sleeping - - - - - - -  Tired, decreased energy - - - - - - -  Change in appetite - - - - -  - -  Feeling bad or failure about yourself  - - - - - - -  Trouble concentrating - - - - - - -  Moving slowly or fidgety/restless - - - - - - -  Suicidal thoughts - - - - - - -  PHQ-9 Score - - - - - - -  Difficult doing work/chores - - - - - - -  Some encounter information is confidential and restricted. Go to Review Flowsheets activity to see all data.  Some recent data might be hidden     Review  of Systems  Musculoskeletal:  Positive for back pain.       Bilateral shoulder,knee, feet pain   All other systems reviewed and are negative.     Objective:   Physical Exam  Awake, alert, appropriate, in manual w/c, accompanied by wife, NAD Wearing healing shoes B/L feet  MS: LE- RLE- HF 4+/5, KE/KF 5-/5; DF 4/5 and PF 5/5 LLE- HF 4+/5; KE/KF 5-/5; DF 3-/5, and PF 4+/5  Skin- wounds on feet- dressed- C/D/I.      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 Also has L foot drop. Still w/c dependent- still cannot put weight on feet per Plastics.  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   2. Suggest acting like has diabetes-- cut down/off sodas. Needs to drink water and try flavor packets if needs something different.  - 1 change every 1-2 weeks- so avoid binge eating/drinking.   3.  When went into hospital with COVID, had dx of DM- A1c of 6.7- so will needs to get PCP to check HbA1c. Should be checked q3 months when has glucose intolerance. Has PCP appt/physical 10/13/21.    4.   At [facility]- suggest a therapist at [facility]-  I suggest this to help with emotional issues related to B?l foot drop/ICU myopathy  5. Will do therapy when able to bear weight  on  feet/legs.  Wait for now.    6. When time to order healing shoes- will need to coordinate with L AFO possibly- might benefit from L foot up.  Will try that first before L AFO.    7. [facility] [address] [facility]- 5/[facility] #60- no refills. Last rx was 05/06/21- so reasonable.    8. F/U in 3 months.  Got rx for Trazodone on 08/20/21 from NP- and has a lot of Duloxetine- con't both meds.    9. Full spectrum light- for seasonal affective disorder- use at least 1 hour/day.   10. Still need manual w/c since pt is Nonweight bearing on his legs/feet due to pressure ulcers on feet- literally NOT able to walk at this time.  Also needs hospital bed to make it easier to get in/out of bed, since NWB on legs-   I spent a total of 41 minutes on visit- going over risk of DM- and seasonal affective disorder.

## 2021-09-07 ENCOUNTER — Telehealth: Payer: Self-pay | Admitting: Pulmonary Disease

## 2021-09-07 NOTE — Telephone Encounter (Signed)
Received a medical record request from [facility] from the dates 12/06/2020 to present. Spoke with [facility], [place], representative to inform them that patient has not been seen in our office during those dates. Glenard Haring states that there is no longer a LTD claim in her system. Shredding request.

## 2021-09-09 ENCOUNTER — Encounter (HOSPITAL_BASED_OUTPATIENT_CLINIC_OR_DEPARTMENT_OTHER): Payer: BC Managed Care – PPO | Attending: Internal Medicine | Admitting: Internal Medicine

## 2021-09-09 ENCOUNTER — Other Ambulatory Visit: Payer: Self-pay

## 2021-09-09 DIAGNOSIS — L97522 Non-pressure chronic ulcer of other part of left foot with fat layer exposed: Secondary | ICD-10-CM | POA: Diagnosis not present

## 2021-09-09 DIAGNOSIS — L97512 Non-pressure chronic ulcer of other part of right foot with fat layer exposed: Secondary | ICD-10-CM | POA: Diagnosis not present

## 2021-09-09 DIAGNOSIS — L89613 Pressure ulcer of right heel, stage 3: Secondary | ICD-10-CM | POA: Diagnosis not present

## 2021-09-09 DIAGNOSIS — Z8616 Personal history of COVID-19: Secondary | ICD-10-CM | POA: Diagnosis not present

## 2021-09-09 DIAGNOSIS — U071 COVID-19: Secondary | ICD-10-CM | POA: Diagnosis not present

## 2021-09-09 DIAGNOSIS — G4734 Idiopathic sleep related nonobstructive alveolar hypoventilation: Secondary | ICD-10-CM | POA: Diagnosis not present

## 2021-09-09 DIAGNOSIS — I10 Essential (primary) hypertension: Secondary | ICD-10-CM | POA: Diagnosis not present

## 2021-09-09 DIAGNOSIS — L89623 Pressure ulcer of left heel, stage 3: Secondary | ICD-10-CM | POA: Diagnosis not present

## 2021-09-09 NOTE — Progress Notes (Signed)
DEACON, GADBOIS ([id number]) Visit Report for 09/09/2021 Debridement Details Patient Name: Date of Service: [name], [place] 09/09/2021 9:15 A M Medical Record Number: [id number] Patient Account Number: [id number] Date of Birth/Sex: Treating RN: Apr 13, 1976 (46 y.o. [name] Primary Care Provider: [name] Other Clinician: Referring Provider: Treating Provider/Extender: [name] in Treatment: 60 Debridement Performed for Assessment: Wound #1 Right Calcaneus Performed By: Physician Ricard Dillon., MD Debridement Type: Debridement Level of Consciousness (Pre-procedure): Awake and Alert Pre-procedure Verification/Time Out Yes - 09:54 Taken: Start Time: 09:54 Pain Control: Lidocaine 5% topical ointment T Area Debrided (L x W): otal 3 (cm) x 1 (cm) = 3 (cm) Tissue and other material debrided: Non-Viable, Slough, Subcutaneous, Slough Level: Skin/Subcutaneous Tissue Debridement Description: Excisional Instrument: Curette Bleeding: Moderate Hemostasis Achieved: Pressure End Time: 09:56 Procedural Pain: 2 Post Procedural Pain: 3 Response to Treatment: Procedure was tolerated well Level of Consciousness (Post- Awake and Alert procedure): Post Debridement Measurements of Total Wound Length: (cm) 3 Stage: Category/Stage III Width: (cm) 1 Depth: (cm) 0.5 Volume: (cm) 1.178 Character of Wound/Ulcer Post Debridement: Improved Post Procedure Diagnosis Same as Pre-procedure Electronic Signature(s) Signed: 09/09/2021 1:01:55 PM By: [name] Signed: 09/09/2021 4:50:25 PM By: Linton Ham MD Entered By: Linton Ham on 09/09/2021 10:29:01 -------------------------------------------------------------------------------- Debridement Details Patient Name: Date of Service: [name], [place] 09/09/2021 9:15 A M Medical Record Number: [id number] Patient Account Number: [id number] Date of Birth/Sex: Treating RN: [birth year] (46 y.o. [name] Primary Care Provider: [name] Other Clinician: Referring Provider: Treating Provider/Extender: [name] in Treatment: 60 Debridement Performed for Assessment: Wound #2 Left Calcaneus Performed By: Physician Ricard Dillon., MD Debridement Type: Debridement Level of Consciousness (Pre-procedure): Awake and Alert Pre-procedure Verification/Time Out Yes - 09:54 Taken: Start Time: 09:54 Pain Control: Lidocaine 5% topical ointment T Area Debrided (L x W): otal 3 (cm) x 0.7 (cm) = 2.1 (cm) Tissue and other material debrided: Non-Viable, Slough, Subcutaneous, Slough Level: Skin/Subcutaneous Tissue Debridement Description: Excisional Instrument: Curette Bleeding: Moderate Hemostasis Achieved: Pressure End Time: 09:56 Procedural Pain: 2 Post Procedural Pain: 3 Response to Treatment: Procedure was tolerated well Level of Consciousness (Post- Awake and Alert procedure): Post Debridement Measurements of Total Wound Length: (cm) 3 Stage: Category/Stage III Width: (cm) 0.7 Depth: (cm) 0.5 Volume: (cm) 0.825 Character of Wound/Ulcer Post Debridement: Improved Post Procedure Diagnosis Same as Pre-procedure Electronic Signature(s) Signed: 09/09/2021 1:01:55 PM By: [name] Signed: 09/09/2021 4:50:25 PM By: Linton Ham MD Entered By: Linton Ham on 09/09/2021 10:29:18 -------------------------------------------------------------------------------- HPI Details Patient Name: Date of Service: [name], [place] 09/09/2021 9:15 A M Medical Record Number: [id number] Patient Account Number: [id number] Date of Birth/Sex: Treating RN: 04-Dec-1975 (46 y.o. [name] Primary Care Provider: [name] Other Clinician: Referring Provider: Treating Provider/Extender: [name] in Treatment: 14 History of Present Illness HPI Description: Wounds are12/03/2020 upon evaluation today patient presents for initial  inspection here in our clinic concerning issues he has been having with the bottoms of his feet bilaterally. He states these actually occurred as wounds when he was hospitalized for 5 months secondary to Covid. He was apparently with tilting bed where he was in an upright position quite frequently and apparently this occurred in some way shape or form during that time. Fortunately there is no sign of active infection at this time. No fevers, chills, nausea, vomiting, or diarrhea. With that being said he still has substantial  wounds on the plantar aspects of his feet Theragen require quite a bit of work to get these to heal. He has been using Santyl currently though that is been problematic both in receiving the medication as well as actually paid for it as it is become quite expensive. Prior to the experience with Covid the patient really did not have any major medical problems other than hypertension he does have some mild generalized weakness following the Covid experience. 07/22/2020 on evaluation today patient appears to be doing okay in regard to his foot ulcers I feel like the wound beds are showing signs of better improvement that I do believe the Iodoflex is helping in this regard. With that being said he does have a lot of drainage currently and this is somewhat blue/green in nature which is consistent with Pseudomonas. I do think a culture today would be appropriate for [place] to evaluate and see if that is indeed the case I would likely start him on antibiotic orally as well he is not allergic to Cipro knows of no issues he has had in the past 12/21; patient was admitted to the clinic earlier this month with bilateral presumed pressure ulcers on the bottom of his feet apparently related to excessive pressure from a tilt table arrangement in the intensive care unit. Patient relates this to being on ECMO but I am not really sure that is exactly related to that. I must say I have never seen anything  like this. He has fairly extensive full-thickness wounds extending from his heel towards his midfoot mostly centered laterally. There is already been some healing distally. He does not appear to have an arterial issue. He has been using gentamicin to the wound surfaces with Iodoflex to help with ongoing debridement 1/6; this is a patient with pressure ulcers on the bottom of his feet related to excessive pressure from a standing position in the intensive care unit. He is complaining of a lot of pain in the right heel. He is not a diabetic. 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 We have been using Iodoflex to help prepare the surfaces of both wounds for an advanced treatment product. He is nonambulatory spending most of his time in a wheelchair I have asked him not to propel the wheelchair with his heels 1/13; in general his wounds look better not much surface area change we have been using Iodoflex as of last week. I did an x-ray of the right heel as the patient was complaining of pain. I had some thoughts about a stress fracture perhaps Achilles tendon problems however what it showed was erosive changes along the inferior aspect of the calcaneus he now has a MRI booked for 1/20. 1/20; in general his wounds continue to be better. Some improvement in the large narrow areas proximally in his foot. He is still complaining of pain in the right heel and tenderness in certain areas of this wound. His MRI is tonight. I am not just looking for osteomyelitis that was brought up on the x-ray I am wondering about stress fractures, tendon ruptures etc. He has no such findings on the left. Also noteworthy is that the patient had critical illness neuropathy and some of the discomfort may be actual improvement in nerve function I am just not sure. These wounds were initially in the setting of severe critical illness related to COVID-19. He was put in a standing position. He may have also  been on pressors at the point  contributing to tissue ischemia. By his description at some point these wounds were grossly necrotic extending proximally up into the Achilles part of his heel. I do not know that I have ever really seen pictures of them like this although they may exist in epic We have ordered Tri layer Oasis. I am trying to stimulate some granulation in these areas. This is of course assuming the MRI is negative for infection 1/27; since the patient was last here he saw Dr. Juleen China of infectious disease. He is planned for vancomycin and ceftriaxone. Prior operative culture grew MSSA. Also ordered baseline lab work. He also ordered arterial studies although the ABIs in our clinic were normal as well as his clinical exam these were normal I do not think he needs to see vascular surgery. His ABIs at the [facility] were 1.22 in the right triphasic waveforms with a normal TBI of 1.15 on the left ABI of 1.22 with triphasic waveforms and a normal TBI of 1.08. Finally he saw Dr. Amalia Hailey who will follow him in for 2 months. At this point I do not think he felt that he needed a procedure on the right calcaneal bone. Dr. Juleen China is elected for broad-spectrum antibiotic The patient is still having pain in the right heel. He walks with a walker 2/3; wounds are generally smaller. He is tolerating his IV antibiotics. I believe this is vancomycin and ceftriaxone. We are still waiting for Oasis burn in terms of his out-of-pocket max which he should be meeting soon given the IV antibiotics, MRIs etc. I have asked him to check in on this. We are using silver collagen in the meantime the wounds look better 2/10; tolerating IV vancomycin and Rocephin. We are waiting to apply for Oasis. Although I am not really sure where he is in his out-of-pocket max. 2/17 started the first application of Oasis trilayer. Still on antibiotics. The wounds have generally look better. The area on the left has a little more  surface slough requiring debridement 0/45; second application of Oasis trilayer. The wound surface granulation is generally look better. The area on the left with undermining laterally I think is come in a bit. 10/08/2020 upon evaluation today patient is here today for [employer] application #3. Fortunately he seems to be doing extremely well with regard to this and we are seeing a lot of new epithelial growth which is great news. Fortunately there is no signs of active infection at this time. 10/16/2020 upon evaluation today patient appears to be doing well with regard to his foot ulcers. Do believe the Oasis has been of benefit for him. I do not see any signs of infection right now which is great news and I think that he has a lot of new epithelial growth which is great to see as well. The patient is very pleased to hear all of this. I do think we can proceed with the Oasis trilayer #4 today. 3/18; not as much improvement in these areas on his heels that I was hoping. I did reapply trilateral Oasis today the tissue looks healthier but not as much fill in as I was hoping. 3/25; better looking today I think this is come in a bit the tissue looks healthier. Triple layer Oasis reapplied #6 4/1; somewhat better looking definitely better looking surface not as much change in surface area as I was hoping. He may be spending more time [place] on days then he needs to although he does have heel offloading boots. Triple  layer Oasis reapplied #7 4/7; unfortunately apparently [employer] will not approve any further Oasis which is unfortunate since the patient did respond nicely both in terms of the condition of the wound bed as well as surface area. There is still some drainage coming from the wound but not a lot there does not appear to be any infection 4/15; we have been using Hydrofera Blue. He continues to have nice rims of epithelialization on the right greater than the left. The left the  epithelialization is coming from the tip of his heel. There is moderate drainage. In this that concerns me about a total contact cast. There is no evidence of infection 4/29; patient has been using Hydrofera Blue with dressing changes. He has no complaints or issues today. 5/5; using Hydrofera Blue. I actually think that he looks marginally better than the last time I saw this 3 weeks ago. There are rims of epithelialization on the left thumb coming from the medial side on the right. Using Hydrofera Blue 5/12; using Hydrofera Blue. These continue to make improvements in surface area. His drainage was not listed as severe I therefore went ahead and put a cast on the left foot. Right foot we will continue to dress his previous 5/16; back for first total contact cast change. He did not tolerate this particularly well cast injury on the anterior tibia among other issues. Difficulty sleeping. I talked him about this in some detail and afterwards is elected to continue. I told him I would like to have a cast on for 3 weeks to see if this is going to help at all. I think he agreed 5/19; I think the wound is better. There is no tunneling towards his midfoot. The undermining medially also looks better. He has a rim of new skin distally. I think we are making progress here. The area on the left also continues to look somewhat better to me using Hydrofera Blue. He has a list of complaints about the cast but none of them seem serious 5/26; patient presents for 1 week follow-up. He has been using a total contact cast and tolerating this well. Hydrofera Blue is the main dressing used. He denies signs of infection. 6/2 Hydrofera Blue total contact cast on the left. These were large ulcers that formed in intensive care unit where the patient was recovering from [place]. May have had something to do with being ventilated in an upright positiono Pressors etc. We have been able to get the areas down considerably and a  viable surface. There is some epithelialization in both sides. Note made of drainage 6/9; changed to [facility] last time because of drainage. He arrives with better looking surfaces and dimensions on the left than the right. Paradoxically the right actually probes more towards his midfoot the left is largely close down but both of these look improved. Using a total contact cast on the left 6/16; complex wounds on his bilateral plantar heels which were initially pressure injury from a stay in the ICU with COVID. We have been using silver alginate most recently. His dimensions of come in quite dramatically however not recently. We have been putting the left foot in a total contact cast 6/23; complex wounds on the bilateral plantar heels. I been putting the left in the cast paradoxically the area on the right is the one that is going towards closure at a faster rate. Quite a bit of drainage on the left. The patient went to see Dr.  Evans who said he was going to standby for skin grafts. I had actually considered sending him for skin grafts however he would be mandatorily off his feet for a period of weeks to months. I am thinking that the area on the right is going to close on its own the area on the left has been more stubborn even though we have him in a total contact cast 6/30; took him out of a total contact cast last week is the right heel seem to be making better progress than the left where I was placing the cast. We are using silver alginate. Both wounds are smaller right greater than left 7/12; both wounds look as though they are making some progress. We are using silver alginate. Heel offloading boots 7/26; very gradual progress especially on the right. Using silver alginate. He is wearing heel offloading boots 8/18; he continues to close these wounds down very gradually. Using silver alginate. The problem polymen being definitive about this is areas of what appears to be callus around the  margins. This is not a 100% of the area but certainly sizable especially on the right 9/1; bilateral plantar feet wounds secondary to prolonged pressure while being ventilated for COVID-19 in an upright position. Essentially pressure ulcers on the bottom of his feet. He is made substantial progress using silver alginate. 9/14; bilateral plantar feet wounds secondary to prolonged pressure. Making progress using silver alginate. 9/29 bilateral plantar feet wounds secondary to prolonged pressure. I changed him to Iodoflex last week. MolecuLight showing reddened blush fluorescence 10/11; patient presents for follow-up. He has no issues or complaints today. He denies signs of infection. He continues to use Iodoflex and antibiotic ointment to the wound beds. 10/27; 2-week follow-up. No evidence of infection. He has callus and thick dry skin around the wound margins we have been using Iodoflex and Bactroban which was in response to a moderate left MolecuLight reddish blush fluorescence. 11/10; 2-week follow-up. Wound margins again have thick callus however the measurements of the actual wound sites are a lot smaller. Everything looks reasonably healthy here. We have been using Iodoflex He was approved for prime matrix but I have elected to delay this given the improvement in the surface area. Hopefully I will not regret that decision as were getting close to the end of the year in terms of insurance payment 12/8; 2-week follow-up. Wounds are generally smaller in size. These were initially substantial wounds extending into the forefoot all the way into the heel on the bilateral plantar feet. They are now both located on the plantar heel distal aspect both of these have a lot of callus around the wounds I used a #5 curette to remove this on the right and the left also some subcutaneous debris to try and get the wound edges were using Iodoflex. He has heel offloading shoe 12/22; 2-week follow-up. Not really  much improvement. He has thick callus around the outer edges of both wounds. I remove this there is some nonviable subcutaneous tissue as well. We have been using Iodoflex. Her intake nurse and myself spontaneously thought of a total contact cast I went back in May. At that time we really were not seeing much of an improvement with a cast although the wound was in a much different situation I would like to retry this in 2 weeks and I discussed this with the patient 08/12/2021; the patient has had some improvement with the Iodoflex. The the area on the left heel plantar more  improved than the right. I had to put him in a total contact cast on the left although I decided to put that off for 2 weeks. I am going to change his primary dressing to silver collagen. I think in both areas he has had some improvement most of the healing seems to be more proximal in the heel. The wounds are in the mid aspect. A lot of thick callus on the right heel however. 1/19; we are using silver collagen on both plantar heel areas. He has had some improvement today. The left did not require any debridement. He still had some eschar on the right that was debrided but both seem to have contracted. I did not put it total contact cast on him today 2/2 we have been using silver collagen. The area on the right plantar heel has areas that appear to be epithelialized interspersed with dry flaking callus and dry skin. I removed this. This really looks better than on the other side. On the left still a large area with raised edges and debris on the surface. The patient states he is in the heel offloading boots for a prolonged period of time and really does not use any other footwear Electronic Signature(s) Signed: 09/09/2021 4:50:25 PM By: Linton Ham MD Entered By: Linton Ham on 09/09/2021 10:20:23 -------------------------------------------------------------------------------- Physical Exam Details Patient Name: Date of  Service: [name], [place] 09/09/2021 9:15 A M Medical Record Number: [id number] Patient Account Number: [id number] Date of Birth/Sex: Treating RN: [birth year] (46 y.o. [name] Primary Care Provider: [name] Other Clinician: Referring Provider: Treating Provider/Extender: [name] in Treatment: 55 Constitutional Patient is hypertensive.. Pulse regular and within target range for patient.Marland Kitchen Respirations regular, non-labored and within target range.. Temperature is normal and within the target range for the patient.Marland Kitchen Appears in no distress. Notes Wound exam; bilateral plantar heels. The area on the right once again looks somewhat better. Most of this appears to be superficial debris. He has a divot extending towards the center of the foot on the right but I remove debris I did not see an open area. The area on the left is more classic looking wound with some depth and edges. I debrided this of adherent fibrinous debris on the surface also callus around the edges. Electronic Signature(s) Signed: 09/09/2021 4:50:25 PM By: Linton Ham MD Entered By: Linton Ham on 09/09/2021 10:21:42 -------------------------------------------------------------------------------- Physician Orders Details Patient Name: Date of Service: [name], [place] 09/09/2021 9:15 A M Medical Record Number: [id number] Patient Account Number: [id number] Date of Birth/Sex: Treating RN: [birth year] (45 y.o. [name] Primary Care Provider: [name] Other Clinician: Referring Provider: Treating Provider/Extender: [name] in Treatment: 49 Verbal / Phone Orders: No Diagnosis Coding Follow-up Appointments ppointment in 1 week. - Dr Dellia Nims Return A Other: - Please visit Monday or Tuesday for Cast Change. Bathing/ Shower/ Hygiene May shower with protection but do not get wound dressing(s) wet. - Cover with cast protector (can purchase cast  protector at [facility] or [facility] ) Edema Control - Lymphedema / SCD / Other Bilateral Lower Extremities Elevate legs to the level of the heart or above for 30 minutes daily and/or when sitting, a frequency of: - throughout the day Avoid standing for long periods of time. Moisturize legs daily. - right leg and foot every night. Off-Loading Total Contact Cast to Left Lower Extremity - TCC-EZ cast to Left Lower Leg Wedge shoe to: - Glophed Shoe to  both feet. Other: - keep pressure off of the bottom of your feet Additional Orders / Instructions Follow Nutritious Diet Wound Treatment Wound #1 - Calcaneus Wound Laterality: Right Cleanser: Normal Saline (Generic) Every Other Day/30 Days Discharge Instructions: Cleanse the wound with Normal Saline prior to applying a clean dressing using gauze sponges, not tissue or cotton balls. Cleanser: Wound Cleanser Every Other Day/30 Days Discharge Instructions: Cleanse the wound with wound cleanser prior to applying a clean dressing using gauze sponges, not tissue or cotton balls. Prim Dressing: Promogran Prisma Matrix, 4.34 (sq in) (silver collagen) (Generic) Every Other Day/30 Days ary Discharge Instructions: Moisten collagen with saline or hydrogel Secondary Dressing: Woven Gauze Sponge, Non-Sterile 4x4 in (Generic) Every Other Day/30 Days Discharge Instructions: Apply over primary dressing as directed. Secondary Dressing: Zetuvit Plus 4x8 in (Generic) Every Other Day/30 Days Discharge Instructions: Apply over primary dressing as directed. Secured With: Elastic Bandage 4 inch (ACE bandage) (Generic) Every Other Day/30 Days Discharge Instructions: Secure with ACE bandage as directed. Secured With: [employer], 4.5x3.1 (in/yd) (Generic) Every Other Day/30 Days Discharge Instructions: Secure with Kerlix as directed. Wound #2 - Calcaneus Wound Laterality: Left Cleanser: Normal Saline (Generic) Every Other Day/30 Days Discharge Instructions: Cleanse  the wound with Normal Saline prior to applying a clean dressing using gauze sponges, not tissue or cotton balls. Cleanser: Wound Cleanser Every Other Day/30 Days Discharge Instructions: Cleanse the wound with wound cleanser prior to applying a clean dressing using gauze sponges, not tissue or cotton balls. Prim Dressing: Promogran Prisma Matrix, 4.34 (sq in) (silver collagen) (Generic) Every Other Day/30 Days ary Discharge Instructions: Moisten collagen with saline or hydrogel Secondary Dressing: Woven Gauze Sponge, Non-Sterile 4x4 in (Generic) Every Other Day/30 Days Discharge Instructions: Apply over primary dressing as directed. Secondary Dressing: Zetuvit Plus 4x8 in (Generic) Every Other Day/30 Days Discharge Instructions: Apply over primary dressing as directed. Secured With: Elastic Bandage 4 inch (ACE bandage) (Generic) Every Other Day/30 Days Discharge Instructions: Secure with ACE bandage as directed. Secured With: [employer], 4.5x3.1 (in/yd) (Generic) Every Other Day/30 Days Discharge Instructions: Secure with Kerlix as directed. Electronic Signature(s) Signed: 09/09/2021 1:01:55 PM By: [name] Signed: 09/09/2021 4:50:25 PM By: Linton Ham MD Entered By: [name] on 09/09/2021 10:12:09 -------------------------------------------------------------------------------- Problem List Details Patient Name: Date of Service: [name], [place] 09/09/2021 9:15 A M Medical Record Number: [id number] Patient Account Number: [id number] Date of Birth/Sex: Treating RN: 05-17-1976 (46 y.o. [name] Primary Care Provider: [name] Other Clinician: Referring Provider: Treating Provider/Extender: [name] in Treatment: 60 Active Problems ICD-10 Encounter Code Description Active Date MDM Diagnosis [phone] Non-pressure chronic ulcer of other part of right foot with fat layer exposed 09/03/2020 No Yes L97.522 Non-pressure chronic  ulcer of other part of left foot with fat layer exposed 09/03/2020 No Yes Inactive Problems ICD-10 Code Description Active Date Inactive Date L89.893 Pressure ulcer of other site, stage 3 07/15/2020 07/15/2020 M62.81 Muscle weakness (generalized) 07/15/2020 07/15/2020 I10 Essential (primary) hypertension 07/15/2020 07/15/2020 M86.171 Other acute osteomyelitis, right ankle and foot 09/03/2020 09/03/2020 Resolved Problems Electronic Signature(s) Signed: 09/09/2021 4:50:25 PM By: Linton Ham MD Entered By: Linton Ham on 09/09/2021 10:18:46 -------------------------------------------------------------------------------- Progress Note Details Patient Name: Date of Service: [name], [place] 09/09/2021 9:15 A M Medical Record Number: [id number] Patient Account Number: [id number] Date of Birth/Sex: Treating RN: [birth year] (46 y.o. [name] Primary Care Provider: [name] Other Clinician: Referring Provider: Treating Provider/Extender: Edythe Lynn,  Lucrezia Europe in Treatment: 60 Subjective History of Present Illness (HPI) Wounds are12/03/2020 upon evaluation today patient presents for initial inspection here in our clinic concerning issues he has been having with the bottoms of his feet bilaterally. He states these actually occurred as wounds when he was hospitalized for 5 months secondary to Covid. He was apparently with tilting bed where he was in an upright position quite frequently and apparently this occurred in some way shape or form during that time. Fortunately there is no sign of active infection at this time. No fevers, chills, nausea, vomiting, or diarrhea. With that being said he still has substantial wounds on the plantar aspects of his feet Theragen require quite a bit of work to get these to heal. He has been using Santyl currently though that is been problematic both in receiving the medication as well as actually paid for it as it is become quite expensive.  Prior to the experience with Covid the patient really did not have any major medical problems other than hypertension he does have some mild generalized weakness following the Covid experience. 07/22/2020 on evaluation today patient appears to be doing okay in regard to his foot ulcers I feel like the wound beds are showing signs of better improvement that I do believe the Iodoflex is helping in this regard. With that being said he does have a lot of drainage currently and this is somewhat blue/green in nature which is consistent with Pseudomonas. I do think a culture today would be appropriate for [place] to evaluate and see if that is indeed the case I would likely start him on antibiotic orally as well he is not allergic to Cipro knows of no issues he has had in the past 12/21; patient was admitted to the clinic earlier this month with bilateral presumed pressure ulcers on the bottom of his feet apparently related to excessive pressure from a tilt table arrangement in the intensive care unit. Patient relates this to being on ECMO but I am not really sure that is exactly related to that. I must say I have never seen anything like this. He has fairly extensive full-thickness wounds extending from his heel towards his midfoot mostly centered laterally. There is already been some healing distally. He does not appear to have an arterial issue. He has been using gentamicin to the wound surfaces with Iodoflex to help with ongoing debridement 1/6; this is a patient with pressure ulcers on the bottom of his feet related to excessive pressure from a standing position in the intensive care unit. He is complaining of a lot of pain in the right heel. He is not a diabetic. 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 We have been using Iodoflex to help prepare the surfaces of both wounds for an advanced treatment product. He is nonambulatory spending most of his time in a wheelchair I have asked  him not to propel the wheelchair with his heels 1/13; in general his wounds look better not much surface area change we have been using Iodoflex as of last week. I did an x-ray of the right heel as the patient was complaining of pain. I had some thoughts about a stress fracture perhaps Achilles tendon problems however what it showed was erosive changes along the inferior aspect of the calcaneus he now has a MRI booked for 1/20. 1/20; in general his wounds continue to be better. Some improvement in the large narrow areas proximally in his foot. He is  still complaining of pain in the right heel and tenderness in certain areas of this wound. His MRI is tonight. I am not just looking for osteomyelitis that was brought up on the x-ray I am wondering about stress fractures, tendon ruptures etc. He has no such findings on the left. Also noteworthy is that the patient had critical illness neuropathy and some of the discomfort may be actual improvement in nerve function I am just not sure. These wounds were initially in the setting of severe critical illness related to COVID-19. He was put in a standing position. He may have also been on pressors at the point contributing to tissue ischemia. By his description at some point these wounds were grossly necrotic extending proximally up into the Achilles part of his heel. I do not know that I have ever really seen pictures of them like this although they may exist in epic We have ordered Tri layer Oasis. I am trying to stimulate some granulation in these areas. This is of course assuming the MRI is negative for infection 1/27; since the patient was last here he saw Dr. Juleen China of infectious disease. He is planned for vancomycin and ceftriaxone. Prior operative culture grew MSSA. Also ordered baseline lab work. He also ordered arterial studies although the ABIs in our clinic were normal as well as his clinical exam these were normal I do not think he needs to  see vascular surgery. His ABIs at the [facility] were 1.22 in the right triphasic waveforms with a normal TBI of 1.15 on the left ABI of 1.22 with triphasic waveforms and a normal TBI of 1.08. Finally he saw Dr. Amalia Hailey who will follow him in for 2 months. At this point I do not think he felt that he needed a procedure on the right calcaneal bone. Dr. Juleen China is elected for broad-spectrum antibiotic The patient is still having pain in the right heel. He walks with a walker 2/3; wounds are generally smaller. He is tolerating his IV antibiotics. I believe this is vancomycin and ceftriaxone. We are still waiting for Oasis burn in terms of his out-of-pocket max which he should be meeting soon given the IV antibiotics, MRIs etc. I have asked him to check in on this. We are using silver collagen in the meantime the wounds look better 2/10; tolerating IV vancomycin and Rocephin. We are waiting to apply for Oasis. Although I am not really sure where he is in his out-of-pocket max. 2/17 started the first application of Oasis trilayer. Still on antibiotics. The wounds have generally look better. The area on the left has a little more surface slough requiring debridement 5/36; second application of Oasis trilayer. The wound surface granulation is generally look better. The area on the left with undermining laterally I think is come in a bit. 10/08/2020 upon evaluation today patient is here today for [employer] application #3. Fortunately he seems to be doing extremely well with regard to this and we are seeing a lot of new epithelial growth which is great news. Fortunately there is no signs of active infection at this time. 10/16/2020 upon evaluation today patient appears to be doing well with regard to his foot ulcers. Do believe the Oasis has been of benefit for him. I do not see any signs of infection right now which is great news and I think that he has a lot of new epithelial growth which is great to see as well.  The patient is very pleased to hear all  of this. I do think we can proceed with the Oasis trilayer #4 today. 3/18; not as much improvement in these areas on his heels that I was hoping. I did reapply trilateral Oasis today the tissue looks healthier but not as much fill in as I was hoping. 3/25; better looking today I think this is come in a bit the tissue looks healthier. Triple layer Oasis reapplied #6 4/1; somewhat better looking definitely better looking surface not as much change in surface area as I was hoping. He may be spending more time [place] on days then he needs to although he does have heel offloading boots. Triple layer Oasis reapplied #7 4/7; unfortunately apparently [facility] will not approve any further Oasis which is unfortunate since the patient did respond nicely both in terms of the condition of the wound bed as well as surface area. There is still some drainage coming from the wound but not a lot there does not appear to be any infection 4/15; we have been using Hydrofera Blue. He continues to have nice rims of epithelialization on the right greater than the left. The left the epithelialization is coming from the tip of his heel. There is moderate drainage. In this that concerns me about a total contact cast. There is no evidence of infection 4/29; patient has been using Hydrofera Blue with dressing changes. He has no complaints or issues today. 5/5; using Hydrofera Blue. I actually think that he looks marginally better than the last time I saw this 3 weeks ago. There are rims of epithelialization on the left thumb coming from the medial side on the right. Using Hydrofera Blue 5/12; using Hydrofera Blue. These continue to make improvements in surface area. His drainage was not listed as severe I therefore went ahead and put a cast on the left foot. Right foot we will continue to dress his previous 5/16; back for first total contact cast change. He did not tolerate  this particularly well cast injury on the anterior tibia among other issues. Difficulty sleeping. I talked him about this in some detail and afterwards is elected to continue. I told him I would like to have a cast on for 3 weeks to see if this is going to help at all. I think he agreed 5/19; I think the wound is better. There is no tunneling towards his midfoot. The undermining medially also looks better. He has a rim of new skin distally. I think we are making progress here. The area on the left also continues to look somewhat better to me using Hydrofera Blue. He has a list of complaints about the cast but none of them seem serious 5/26; patient presents for 1 week follow-up. He has been using a total contact cast and tolerating this well. Hydrofera Blue is the main dressing used. He denies signs of infection. 6/2 Hydrofera Blue total contact cast on the left. These were large ulcers that formed in intensive care unit where the patient was recovering from [place]. May have had something to do with being ventilated in an upright positiono Pressors etc. We have been able to get the areas down considerably and a viable surface. There is some epithelialization in both sides. Note made of drainage 6/9; changed to [facility] last time because of drainage. He arrives with better looking surfaces and dimensions on the left than the right. Paradoxically the right actually probes more towards his midfoot the left is largely close down but both of these  look improved. Using a total contact cast on the left 6/16; complex wounds on his bilateral plantar heels which were initially pressure injury from a stay in the ICU with COVID. We have been using silver alginate most recently. His dimensions of come in quite dramatically however not recently. We have been putting the left foot in a total contact cast 6/23; complex wounds on the bilateral plantar heels. I been putting the left in the cast paradoxically the  area on the right is the one that is going towards closure at a faster rate. Quite a bit of drainage on the left. The patient went to see Dr. Amalia Hailey who said he was going to standby for skin grafts. I had actually considered sending him for skin grafts however he would be mandatorily off his feet for a period of weeks to months. I am thinking that the area on the right is going to close on its own the area on the left has been more stubborn even though we have him in a total contact cast 6/30; took him out of a total contact cast last week is the right heel seem to be making better progress than the left where I was placing the cast. We are using silver alginate. Both wounds are smaller right greater than left 7/12; both wounds look as though they are making some progress. We are using silver alginate. Heel offloading boots 7/26; very gradual progress especially on the right. Using silver alginate. He is wearing heel offloading boots 8/18; he continues to close these wounds down very gradually. Using silver alginate. The problem polymen being definitive about this is areas of what appears to be callus around the margins. This is not a 100% of the area but certainly sizable especially on the right 9/1; bilateral plantar feet wounds secondary to prolonged pressure while being ventilated for COVID-19 in an upright position. Essentially pressure ulcers on the bottom of his feet. He is made substantial progress using silver alginate. 9/14; bilateral plantar feet wounds secondary to prolonged pressure. Making progress using silver alginate. 9/29 bilateral plantar feet wounds secondary to prolonged pressure. I changed him to Iodoflex last week. MolecuLight showing reddened blush fluorescence 10/11; patient presents for follow-up. He has no issues or complaints today. He denies signs of infection. He continues to use Iodoflex and antibiotic ointment to the wound beds. 10/27; 2-week follow-up. No evidence of  infection. He has callus and thick dry skin around the wound margins we have been using Iodoflex and Bactroban which was in response to a moderate left MolecuLight reddish blush fluorescence. 11/10; 2-week follow-up. Wound margins again have thick callus however the measurements of the actual wound sites are a lot smaller. Everything looks reasonably healthy here. We have been using Iodoflex He was approved for prime matrix but I have elected to delay this given the improvement in the surface area. Hopefully I will not regret that decision as were getting close to the end of the year in terms of insurance payment 12/8; 2-week follow-up. Wounds are generally smaller in size. These were initially substantial wounds extending into the forefoot all the way into the heel on the bilateral plantar feet. They are now both located on the plantar heel distal aspect both of these have a lot of callus around the wounds I used a #5 curette to remove this on the right and the left also some subcutaneous debris to try and get the wound edges were using Iodoflex. He has heel offloading shoe 12/22;  2-week follow-up. Not really much improvement. He has thick callus around the outer edges of both wounds. I remove this there is some nonviable subcutaneous tissue as well. We have been using Iodoflex. Her intake nurse and myself spontaneously thought of a total contact cast I went back in May. At that time we really were not seeing much of an improvement with a cast although the wound was in a much different situation I would like to retry this in 2 weeks and I discussed this with the patient 08/12/2021; the patient has had some improvement with the Iodoflex. The the area on the left heel plantar more improved than the right. I had to put him in a total contact cast on the left although I decided to put that off for 2 weeks. I am going to change his primary dressing to silver collagen. I think in both areas he has had some  improvement most of the healing seems to be more proximal in the heel. The wounds are in the mid aspect. A lot of thick callus on the right heel however. 1/19; we are using silver collagen on both plantar heel areas. He has had some improvement today. The left did not require any debridement. He still had some eschar on the right that was debrided but both seem to have contracted. I did not put it total contact cast on him today 2/2 we have been using silver collagen. The area on the right plantar heel has areas that appear to be epithelialized interspersed with dry flaking callus and dry skin. I removed this. This really looks better than on the other side. On the left still a large area with raised edges and debris on the surface. The patient states he is in the heel offloading boots for a prolonged period of time and really does not use any other footwear Objective Constitutional Patient is hypertensive.. Pulse regular and within target range for patient.Marland Kitchen Respirations regular, non-labored and within target range.. Temperature is normal and within the target range for the patient.Marland Kitchen Appears in no distress. Vitals Time Taken: 9:31 AM, Height: 69 in, Weight: 280 lbs, BMI: 41.3, Temperature: 98.2 F, Pulse: 82 bpm, Respiratory Rate: 18 breaths/min, Blood Pressure: 169/99 mmHg. General Notes: Wound exam; bilateral plantar heels. The area on the right once again looks somewhat better. Most of this appears to be superficial debris. He has a divot extending towards the center of the foot on the right but I remove debris I did not see an open area. The area on the left is more classic looking wound with some depth and edges. I debrided this of adherent fibrinous debris on the surface also callus around the edges. Integumentary (Hair, Skin) Wound #1 status is Open. Original cause of wound was Pressure Injury. The date acquired was: 10/07/2019. The wound has been in treatment 60 weeks. The wound is located  on the Right Calcaneus. The wound measures 3cm length x 1cm width x 0.5cm depth; 2.356cm^2 area and 1.178cm^3 volume. There is Fat Layer (Subcutaneous Tissue) exposed. There is no tunneling or undermining noted. There is a medium amount of serosanguineous drainage noted. The wound margin is thickened. There is small (1-33%) red, pink granulation within the wound bed. There is a large (67-100%) amount of necrotic tissue within the wound bed including Eschar and Adherent Slough. Wound #2 status is Open. Original cause of wound was Pressure Injury. The date acquired was: 10/07/2019. The wound has been in treatment 60 weeks. The wound is located  on the Left Calcaneus. The wound measures 3cm length x 0.7cm width x 0.5cm depth; 1.649cm^2 area and 0.825cm^3 volume. There is Fat Layer (Subcutaneous Tissue) exposed. There is no tunneling or undermining noted. There is a medium amount of serosanguineous drainage noted. The wound margin is thickened. There is medium (34-66%) red granulation within the wound bed. There is a medium (34-66%) amount of necrotic tissue within the wound bed including Adherent Slough. Assessment Active Problems ICD-10 Non-pressure chronic ulcer of other part of right foot with fat layer exposed Non-pressure chronic ulcer of other part of left foot with fat layer exposed Procedures Wound #1 Pre-procedure diagnosis of Wound #1 is a Pressure Ulcer located on the Right Calcaneus . There was a Excisional Skin/Subcutaneous Tissue Debridement with a total area of 3 sq cm performed by Ricard Dillon., MD. With the following instrument(s): Curette to remove Non-Viable tissue/material. Material removed includes Subcutaneous Tissue and Slough and after achieving pain control using Lidocaine 5% topical ointment. No specimens were taken. A time out was conducted at 09:54, prior to the start of the procedure. A Moderate amount of bleeding was controlled with Pressure. The procedure was  tolerated well with a pain level of 2 throughout and a pain level of 3 following the procedure. Post Debridement Measurements: 3cm length x 1cm width x 0.5cm depth; 1.178cm^3 volume. Post debridement Stage noted as Category/Stage III. Character of Wound/Ulcer Post Debridement is improved. Post procedure Diagnosis Wound #1: Same as Pre-Procedure Wound #2 Pre-procedure diagnosis of Wound #2 is a Pressure Ulcer located on the Left Calcaneus . There was a Excisional Skin/Subcutaneous Tissue Debridement with a total area of 2.1 sq cm performed by Ricard Dillon., MD. With the following instrument(s): Curette to remove Non-Viable tissue/material. Material removed includes Subcutaneous Tissue and Slough and after achieving pain control using Lidocaine 5% topical ointment. No specimens were taken. A time out was conducted at 09:54, prior to the start of the procedure. A Moderate amount of bleeding was controlled with Pressure. The procedure was tolerated well with a pain level of 2 throughout and a pain level of 3 following the procedure. Post Debridement Measurements: 3cm length x 0.7cm width x 0.5cm depth; 0.825cm^3 volume. Post debridement Stage noted as Category/Stage III. Character of Wound/Ulcer Post Debridement is improved. Post procedure Diagnosis Wound #2: Same as Pre-Procedure Plan Follow-up Appointments: Return Appointment in 1 week. - Dr Dellia Nims Other: - Please visit Monday or Tuesday for Cast Change. Bathing/ Shower/ Hygiene: May shower with protection but do not get wound dressing(s) wet. - Cover with cast protector (can purchase cast protector at [facility] or [facility] ) Edema Control - Lymphedema / SCD / Other: Elevate legs to the level of the heart or above for 30 minutes daily and/or when sitting, a frequency of: - throughout the day Avoid standing for long periods of time. Moisturize legs daily. - right leg and foot every night. Off-Loading: T Contact Cast to Left Lower Extremity -  TCC-EZ cast to Left Lower Leg otal Wedge shoe to: - Glophed Shoe to both feet. Other: - keep pressure off of the bottom of your feet Additional Orders / Instructions: Follow Nutritious Diet WOUND #1: - Calcaneus Wound Laterality: Right Cleanser: Normal Saline (Generic) Every Other Day/30 Days Discharge Instructions: Cleanse the wound with Normal Saline prior to applying a clean dressing using gauze sponges, not tissue or cotton balls. Cleanser: Wound Cleanser Every Other Day/30 Days Discharge Instructions: Cleanse the wound with wound cleanser prior to applying a clean dressing  using gauze sponges, not tissue or cotton balls. Prim Dressing: Promogran Prisma Matrix, 4.34 (sq in) (silver collagen) (Generic) Every Other Day/30 Days ary Discharge Instructions: Moisten collagen with saline or hydrogel Secondary Dressing: Woven Gauze Sponge, Non-Sterile 4x4 in (Generic) Every Other Day/30 Days Discharge Instructions: Apply over primary dressing as directed. Secondary Dressing: Zetuvit Plus 4x8 in (Generic) Every Other Day/30 Days Discharge Instructions: Apply over primary dressing as directed. Secured With: Elastic Bandage 4 inch (ACE bandage) (Generic) Every Other Day/30 Days Discharge Instructions: Secure with ACE bandage as directed. Secured With: [employer], 4.5x3.1 (in/yd) (Generic) Every Other Day/30 Days Discharge Instructions: Secure with Kerlix as directed. WOUND #2: - Calcaneus Wound Laterality: Left Cleanser: Normal Saline (Generic) Every Other Day/30 Days Discharge Instructions: Cleanse the wound with Normal Saline prior to applying a clean dressing using gauze sponges, not tissue or cotton balls. Cleanser: Wound Cleanser Every Other Day/30 Days Discharge Instructions: Cleanse the wound with wound cleanser prior to applying a clean dressing using gauze sponges, not tissue or cotton balls. Prim Dressing: Promogran Prisma Matrix, 4.34 (sq in) (silver collagen) (Generic) Every  Other Day/30 Days ary Discharge Instructions: Moisten collagen with saline or hydrogel Secondary Dressing: Woven Gauze Sponge, Non-Sterile 4x4 in (Generic) Every Other Day/30 Days Discharge Instructions: Apply over primary dressing as directed. Secondary Dressing: Zetuvit Plus 4x8 in (Generic) Every Other Day/30 Days Discharge Instructions: Apply over primary dressing as directed. Secured With: Elastic Bandage 4 inch (ACE bandage) (Generic) Every Other Day/30 Days Discharge Instructions: Secure with ACE bandage as directed. Secured With: [employer], 4.5x3.1 (in/yd) (Generic) Every Other Day/30 Days Discharge Instructions: Secure with Kerlix as directed. 1. Silver collagen to both areas to continue. 2. I am going to put a total contact cast on the left after we talked with the patient. His wife seems to be more convinced that he spends too much time in the heel on the left. This is not his driving foot and we will see what taking the pressure off this does with the deeper wound. 3. On the right everything is more superficial Electronic Signature(s) Signed: 09/09/2021 4:50:25 PM By: Linton Ham MD Entered By: Linton Ham on 09/09/2021 10:22:39 -------------------------------------------------------------------------------- Total Contact Cast Details Patient Name: Date of Service: [name], [place] 09/09/2021 9:15 A M Medical Record Number: [id number] Patient Account Number: [id number] Date of Birth/Sex: Treating RN: 1976/03/27 (46 y.o. [name] Primary Care Provider: [name] Other Clinician: Referring Provider: Treating Provider/Extender: [name] in Treatment: 85 T Contact Cast Applied for Wound Assessment: otal Wound #2 Left Calcaneus Performed By: Physician Ricard Dillon., MD Post Procedure Diagnosis Same as Pre-procedure Electronic Signature(s) Signed: 09/09/2021 4:50:25 PM By: Linton Ham MD Entered By: Linton Ham on 09/09/2021 10:29:32 -------------------------------------------------------------------------------- SuperBill Details Patient Name: Date of Service: [name], [place]. 09/09/2021 Medical Record Number: [id number] Patient Account Number: [id number] Date of Birth/Sex: Treating RN: [birth year] (46 y.o. [name] Primary Care Provider: [name] Other Clinician: Referring Provider: Treating Provider/Extender: [name] in Treatment: 60 Diagnosis Coding ICD-10 Codes Code Description [phone] Non-pressure chronic ulcer of other part of right foot with fat layer exposed L97.522 Non-pressure chronic ulcer of other part of left foot with fat layer exposed Facility Procedures CPT4 Code: [id number] Description: 11042 - DEB SUBQ TISSUE 20 SQ CM/< ICD-10 Diagnosis Description L97.512 Non-pressure chronic ulcer of other part of right foot with fat layer exposed L97.522 Non-pressure chronic ulcer of other part of  left foot with fat layer exposed Modifier: Quantity: 1 Physician Procedures : CPT4 Code Description Modifier [id number] - WC PHYS SUBQ TISS 20 SQ CM ICD-10 Diagnosis Description L97.512 Non-pressure chronic ulcer of other part of right foot with fat layer exposed L97.522 Non-pressure chronic ulcer of other part of left foot  with fat layer exposed Quantity: 1 Electronic Signature(s) Signed: 09/09/2021 4:50:25 PM By: Linton Ham MD Entered By: Linton Ham on 09/09/2021 10:30:22

## 2021-09-09 NOTE — Progress Notes (Signed)
[name] ([id number]) Visit Report for 09/09/2021 Arrival Information Details Patient Name: Date of Service: [name], [name] 09/09/2021 9:15 A M Medical Record Number: [id number] Patient Account Number: [id number] Date of Birth/Sex: Treating RN: [birth year] (46 y.o. [name] Primary Care Yulia Ulrich: [name] Other Clinician: Referring Eamonn Sermeno: Treating Santina Trillo/Extender: [name] in Treatment: 26 Visit Information History Since Last Visit Added or deleted any medications: No Patient Arrived: Wheel Chair Any new allergies or adverse reactions: No Arrival Time: 09:31 Had a fall or experienced change in No Accompanied By: spouse activities of daily living that may affect Transfer Assistance: None risk of falls: Patient Identification Verified: Yes Signs or symptoms of abuse/neglect since last visito No Patient Requires Transmission-Based Precautions: No Hospitalized since last visit: No Patient Has Alerts: No Implantable device outside of the clinic excluding No cellular tissue based products placed in the center since last visit: Has Dressing in Place as Prescribed: Yes Pain Present Now: No Electronic Signature(s) Signed: 09/09/2021 1:01:55 PM By: [name] Entered By: [name] on 09/09/2021 10:34:12 -------------------------------------------------------------------------------- Encounter Discharge Information Details Patient Name: Date of Service: [name], [name] 09/09/2021 9:15 A M Medical Record Number: [id number] Patient Account Number: [id number] Date of Birth/Sex: Treating RN: [birth year] (46 y.o. [name] Primary Care Shereena Berquist: [name] Other Clinician: Referring Ola Fawver: Treating Darek Eifler/Extender: [name] in Treatment: 40 Encounter Discharge Information Items Post Procedure Vitals Discharge Condition: Stable Temperature (F): 98.2 Ambulatory Status: Wheelchair Pulse  (bpm): 82 Discharge Destination: Home Respiratory Rate (breaths/min): 18 Transportation: Private Auto Blood Pressure (mmHg): 169/99 Accompanied By: spouse Schedule Follow-up Appointment: Yes Clinical Summary of Care: Patient Declined Electronic Signature(s) Signed: 09/09/2021 1:01:55 PM By: [name] Entered By: [name] on 09/09/2021 10:33:48 -------------------------------------------------------------------------------- Lower Extremity Assessment Details Patient Name: Date of Service: [name], [name] 09/09/2021 9:15 A M Medical Record Number: [id number] Patient Account Number: [id number] Date of Birth/Sex: Treating RN: Mar 16, 1976 (46 y.o. [name] Primary Care Dalma Panchal: [name] Other Clinician: Referring Izamar Linden: Treating Brailynn Breth/Extender: [name] in Treatment: 60 Edema Assessment Assessed: [name]: No] [name]: No] Edema: [Left: No] [Right: No] Calf Left: Right: Point of Measurement: 29 cm From Medial Instep 43.3 cm 42.9 cm Ankle Left: Right: Point of Measurement: 9 cm From Medial Instep 25.1 cm 24 cm Electronic Signature(s) Signed: 09/09/2021 1:01:55 PM By: [name] Entered By: [name] on 09/09/2021 09:45:35 -------------------------------------------------------------------------------- Multi Wound Chart Details Patient Name: Date of Service: [name], [name] 09/09/2021 9:15 A M Medical Record Number: [id number] Patient Account Number: [id number] Date of Birth/Sex: Treating RN: 09-03-1975 (46 y.o. [name] Primary Care Norberto Wishon: [name] Other Clinician: Referring Vencil Basnett: Treating Sparsh Callens/Extender: [name] in Treatment: 60 Vital Signs Height(in): 25 Pulse(bpm): 51 Weight(lbs): 280 Blood Pressure(mmHg): 169/99 Body Mass Index(BMI): 41.3 Temperature(F): 98.2 Respiratory Rate(breaths/min): 18 Photos: [N/A:N/A] Right Calcaneus Left Calcaneus  N/A Wound Location: Pressure Injury Pressure Injury N/A Wounding Event: Pressure Ulcer Pressure Ulcer N/A Primary Etiology: Asthma, Angina, Hypertension Asthma, Angina, Hypertension N/A Comorbid History: 10/07/2019 10/07/2019 N/A Date Acquired: 60 60 N/A Weeks of Treatment: Open Open N/A Wound Status: No No N/A Wound Recurrence: 3x1x0.5 3x0.7x0.5 N/A Measurements L x W x D (cm) 2.356 1.649 N/A A (cm) : rea 1.178 0.825 N/A Volume (cm) : 92.50% 93.90% N/A % Reduction in A rea: 96.30% 97.00% N/A % Reduction in Volume: Category/Stage III Category/Stage III N/A Classification: Medium  Medium N/A Exudate A mount: Serosanguineous Serosanguineous N/A Exudate Type: red, brown red, brown N/A Exudate Color: Thickened Thickened N/A Wound Margin: Small (1-33%) Medium (34-66%) N/A Granulation A mount: Red, Pink Red N/A Granulation Quality: Large (67-100%) Medium (34-66%) N/A Necrotic A mount: Eschar, Adherent Slough Adherent Slough N/A Necrotic Tissue: Fat Layer (Subcutaneous Tissue): Yes Fat Layer (Subcutaneous Tissue): Yes N/A Exposed Structures: Fascia: No Fascia: No Tendon: No Tendon: No Muscle: No Muscle: No Joint: No Joint: No Bone: No Bone: No Small (1-33%) Small (1-33%) N/A Epithelialization: Debridement - Excisional Debridement - Excisional N/A Debridement: Pre-procedure Verification/Time Out 09:54 09:54 N/A Taken: Lidocaine 5% topical ointment Lidocaine 5% topical ointment N/A Pain Control: Subcutaneous, Slough Subcutaneous, Slough N/A Tissue Debrided: Skin/Subcutaneous Tissue Skin/Subcutaneous Tissue N/A Level: 3 2.1 N/A Debridement A (sq cm): rea Curette Curette N/A Instrument: Moderate Moderate N/A Bleeding: Pressure Pressure N/A Hemostasis A chieved: 2 2 N/A Procedural Pain: 3 3 N/A Post Procedural Pain: Procedure was tolerated well Procedure was tolerated well N/A Debridement Treatment Response: 3x1x0.5 3x0.7x0.5 N/A Post Debridement  Measurements L x W x D (cm) 1.178 0.825 N/A Post Debridement Volume: (cm) Category/Stage III Category/Stage III N/A Post Debridement Stage: Debridement Debridement N/A Procedures Performed: Treatment Notes Electronic Signature(s) Signed: 09/09/2021 1:01:55 PM By: [name] Signed: 09/09/2021 4:50:25 PM By: Linton Ham MD Entered By: Linton Ham on 09/09/2021 10:18:55 -------------------------------------------------------------------------------- [place] Details Patient Name: Date of Service: [name], [place] 09/09/2021 9:15 A M Medical Record Number: [id number] Patient Account Number: [id number] Date of Birth/Sex: Treating RN: February 05, 1976 (46 y.o. [name] Primary Care Duff Pozzi: [name] Other Clinician: Referring Jaeanna Mccomber: Treating Idamay Hosein/Extender: [name] in Treatment: 29 Multidisciplinary Care Plan reviewed with physician Active Inactive Abuse / Safety / Falls / Self Care Management Nursing Diagnoses: Potential for falls Goals: Patient/caregiver will verbalize/demonstrate measures taken to prevent injury and/or falls Date Initiated: 07/15/2020 Target Resolution Date: 09/04/2021 Goal Status: Active Interventions: Assess fall risk on admission and as needed Assess impairment of mobility on admission and as needed per policy Notes: Wound/Skin Impairment Nursing Diagnoses: Impaired tissue integrity Knowledge deficit related to ulceration/compromised skin integrity Goals: Patient/caregiver will verbalize understanding of skin care regimen Date Initiated: 07/15/2020 Target Resolution Date: 09/04/2021 Goal Status: Active Ulcer/skin breakdown will have a volume reduction of 30% by week 4 Date Initiated: 07/15/2020 Date Inactivated: 08/20/2020 Target Resolution Date: 09/03/2020 Goal Status: Unmet Unmet Reason: no major changes. Ulcer/skin breakdown will heal within 14 weeks Date Initiated:  12/04/2020 Date Inactivated: 12/10/2020 Target Resolution Date: 12/10/2020 Unmet Reason: wounds still open at 14 Goal Status: Unmet weeks and today 21 weeks. Interventions: Assess patient/caregiver ability to obtain necessary supplies Assess patient/caregiver ability to perform ulcer/skin care regimen upon admission and as needed Assess ulceration(s) every visit Provide education on ulcer and skin care Treatment Activities: Skin care regimen initiated : 07/15/2020 Topical wound management initiated : 07/15/2020 Notes: Electronic Signature(s) Signed: 09/09/2021 1:01:55 PM By: [name] Entered By: [name] on 09/09/2021 09:34:34 -------------------------------------------------------------------------------- Pain Assessment Details Patient Name: Date of Service: [name], [place] 09/09/2021 9:15 A M Medical Record Number: [id number] Patient Account Number: [id number] Date of Birth/Sex: Treating RN: 25-Jul-1976 (46 y.o. [name] Primary Care Jarica Plass: [name] Other Clinician: Referring Lenorris Karger: Treating Jru Pense/Extender: [name] in Treatment: 60 Active Problems Location of Pain Severity and Description of Pain Patient Has Paino No Site Locations Pain Management and Medication Current Pain Management: Electronic Signature(s) Signed: 09/09/2021 1:01:55  PM By: [name] Entered By: [name] on 09/09/2021 09:33:17 -------------------------------------------------------------------------------- Patient/Caregiver Education Details Patient Name: Date of Service: [name], [place] 2/2/2023andnbsp9:15 [place] Record Number: [id number] Patient Account Number: [id number] Date of Birth/Gender: Treating RN: 05/31/1976 (46 y.o. [name] Primary Care Physician: [name] Other Clinician: Referring Physician: Treating Physician/Extender: [name] in Treatment: 15 Education  Assessment Education Provided To: Patient Education Topics Provided Wound/Skin Impairment: Methods: Explain/Verbal Responses: Return demonstration correctly Electronic Signature(s) Signed: 09/09/2021 1:01:55 PM By: [name] Entered By: [name] on 09/09/2021 09:35:39 -------------------------------------------------------------------------------- Wound Assessment Details Patient Name: Date of Service: [name], [place] 09/09/2021 9:15 A M Medical Record Number: [id number] Patient Account Number: [id number] Date of Birth/Sex: Treating RN: 02/16/1976 (46 y.o. [name] Primary Care Gevorg Brum: [name] Other Clinician: Referring Kasidy Gianino: Treating Valeda Corzine/Extender: [name] in Treatment: 60 Wound Status Wound Number: 1 Primary Etiology: Pressure Ulcer Wound Location: Right Calcaneus Wound Status: Open Wounding Event: Pressure Injury Comorbid History: Asthma, Angina, Hypertension Date Acquired: 10/07/2019 Weeks Of Treatment: 60 Clustered Wound: No Photos Wound Measurements Length: (cm) 3 Width: (cm) 1 Depth: (cm) 0.5 Area: (cm) 2.356 Volume: (cm) 1.178 % Reduction in Area: 92.5% % Reduction in Volume: 96.3% Epithelialization: Small (1-33%) Tunneling: No Undermining: No Wound Description Classification: Category/Stage III Wound Margin: Thickened Exudate Amount: Medium Exudate Type: Serosanguineous Exudate Color: red, brown Foul Odor After Cleansing: No Slough/Fibrino No Wound Bed Granulation Amount: Small (1-33%) Exposed Structure Granulation Quality: Red, Pink Fascia Exposed: No Necrotic Amount: Large (67-100%) Fat Layer (Subcutaneous Tissue) Exposed: Yes Necrotic Quality: Eschar, Adherent Slough Tendon Exposed: No Muscle Exposed: No Joint Exposed: No Bone Exposed: No Treatment Notes Wound #1 (Calcaneus) Wound Laterality: Right Cleanser Normal Saline Discharge Instruction: Cleanse the wound with Normal  Saline prior to applying a clean dressing using gauze sponges, not tissue or cotton balls. Wound Cleanser Discharge Instruction: Cleanse the wound with wound cleanser prior to applying a clean dressing using gauze sponges, not tissue or cotton balls. Peri-Wound Care Topical Primary Dressing Promogran Prisma Matrix, 4.34 (sq in) (silver collagen) Discharge Instruction: Moisten collagen with saline or hydrogel Secondary Dressing Woven Gauze Sponge, Non-Sterile 4x4 in Discharge Instruction: Apply over primary dressing as directed. Zetuvit Plus 4x8 in Discharge Instruction: Apply over primary dressing as directed. Secured With Elastic Bandage 4 inch (ACE bandage) Discharge Instruction: Secure with ACE bandage as directed. Kerlix Roll Sterile, 4.5x3.1 (in/yd) Discharge Instruction: Secure with Kerlix as directed. Compression Wrap Compression Stockings Add-Ons Electronic Signature(s) Signed: 09/09/2021 1:01:55 PM By: [name] Entered By: [name] on 09/09/2021 09:39:47 -------------------------------------------------------------------------------- Wound Assessment Details Patient Name: Date of Service: [name], [place] 09/09/2021 9:15 A M Medical Record Number: [id number] Patient Account Number: [id number] Date of Birth/Sex: Treating RN: 23-Nov-1975 (46 y.o. [name] Primary Care Tracyann Duffell: [name] Other Clinician: Referring Jaydynn Wolford: Treating Ryett Hamman/Extender: [name] in Treatment: 60 Wound Status Wound Number: 2 Primary Etiology: Pressure Ulcer Wound Location: Left Calcaneus Wound Status: Open Wounding Event: Pressure Injury Comorbid History: Asthma, Angina, Hypertension Date Acquired: 10/07/2019 Weeks Of Treatment: 60 Clustered Wound: No Photos Wound Measurements Length: (cm) 3 Width: (cm) 0.7 Depth: (cm) 0.5 Area: (cm) 1.649 Volume: (cm) 0.825 % Reduction in Area: 93.9% % Reduction in Volume:  97% Epithelialization: Small (1-33%) Tunneling: No Undermining: No Wound Description Classification: Category/Stage III Wound Margin: Thickened Exudate Amount: Medium Exudate Type: Serosanguineous Exudate Color: red, brown Foul Odor After  Cleansing: No Slough/Fibrino No Wound Bed Granulation Amount: Medium (34-66%) Exposed Structure Granulation Quality: Red Fascia Exposed: No Necrotic Amount: Medium (34-66%) Fat Layer (Subcutaneous Tissue) Exposed: Yes Necrotic Quality: Adherent Slough Tendon Exposed: No Muscle Exposed: No Joint Exposed: No Bone Exposed: No Treatment Notes Wound #2 (Calcaneus) Wound Laterality: Left Cleanser Normal Saline Discharge Instruction: Cleanse the wound with Normal Saline prior to applying a clean dressing using gauze sponges, not tissue or cotton balls. Wound Cleanser Discharge Instruction: Cleanse the wound with wound cleanser prior to applying a clean dressing using gauze sponges, not tissue or cotton balls. Peri-Wound Care Topical Primary Dressing Promogran Prisma Matrix, 4.34 (sq in) (silver collagen) Discharge Instruction: Moisten collagen with saline or hydrogel Secondary Dressing Woven Gauze Sponge, Non-Sterile 4x4 in Discharge Instruction: Apply over primary dressing as directed. Zetuvit Plus 4x8 in Discharge Instruction: Apply over primary dressing as directed. Secured With Elastic Bandage 4 inch (ACE bandage) Discharge Instruction: Secure with ACE bandage as directed. Kerlix Roll Sterile, 4.5x3.1 (in/yd) Discharge Instruction: Secure with Kerlix as directed. Compression Wrap Compression Stockings Add-Ons Electronic Signature(s) Signed: 09/09/2021 1:01:55 PM By: [name] Entered By: [name] on 09/09/2021 09:40:52 -------------------------------------------------------------------------------- Vitals Details Patient Name: Date of Service: [name], [place] 09/09/2021 9:15 A M Medical Record Number:  [id number] Patient Account Number: [id number] Date of Birth/Sex: Treating RN: 1976-01-24 (46 y.o. [name] Primary Care Gianni Fuchs: [name] Other Clinician: Referring Burech Mcfarland: Treating Lindzee Gouge/Extender: [name] in Treatment: 60 Vital Signs Time Taken: 09:31 Temperature (F): 98.2 Height (in): 69 Pulse (bpm): 82 Weight (lbs): 280 Respiratory Rate (breaths/min): 18 Body Mass Index (BMI): 41.3 Blood Pressure (mmHg): 169/99 Reference Range: 80 - 120 mg / dl Electronic Signature(s) Signed: 09/09/2021 1:01:55 PM By: [name] Entered By: [name] on 09/09/2021 09:33:05

## 2021-09-13 ENCOUNTER — Encounter (HOSPITAL_BASED_OUTPATIENT_CLINIC_OR_DEPARTMENT_OTHER): Payer: BC Managed Care – PPO | Admitting: Internal Medicine

## 2021-09-13 ENCOUNTER — Telehealth: Payer: Self-pay | Admitting: *Deleted

## 2021-09-13 ENCOUNTER — Other Ambulatory Visit: Payer: Self-pay

## 2021-09-13 DIAGNOSIS — L97512 Non-pressure chronic ulcer of other part of right foot with fat layer exposed: Secondary | ICD-10-CM | POA: Diagnosis not present

## 2021-09-13 DIAGNOSIS — I10 Essential (primary) hypertension: Secondary | ICD-10-CM | POA: Diagnosis not present

## 2021-09-13 DIAGNOSIS — L97522 Non-pressure chronic ulcer of other part of left foot with fat layer exposed: Secondary | ICD-10-CM | POA: Diagnosis not present

## 2021-09-13 DIAGNOSIS — Z8616 Personal history of COVID-19: Secondary | ICD-10-CM | POA: Diagnosis not present

## 2021-09-13 NOTE — Progress Notes (Signed)
NIAL, HAWE ([id number]) Visit Report for 09/13/2021 Chief Complaint Document Details Patient Name: Date of Service: [name] [place] 09/13/2021 9:45 A M Medical Record Number: [id number] Patient Account Number: [id number] Date of Birth/Sex: Treating RN: 03/01/1976 (46 y.o. [name] Primary Care Provider: [name] Other Clinician: Referring Provider: Treating Provider/Extender: [name] in Treatment: 70 Information Obtained from: Patient Chief Complaint Bilateral Plantar Foot Ulcers Electronic Signature(s) Signed: 09/13/2021 11:51:43 AM By: Kalman Shan DO Entered By: Kalman Shan on 09/13/2021 11:46:23 -------------------------------------------------------------------------------- HPI Details Patient Name: Date of Service: [name], [place] 09/13/2021 9:45 A M Medical Record Number: [id number] Patient Account Number: [id number] Date of Birth/Sex: Treating RN: [birth year] (46 y.o. [name] Primary Care Provider: [name] Other Clinician: Referring Provider: Treating Provider/Extender: [name] in Treatment: 74 History of Present Illness HPI Description: Wounds are12/03/2020 upon evaluation today patient presents for initial inspection here in our clinic concerning issues he has been having with the bottoms of his feet bilaterally. He states these actually occurred as wounds when he was hospitalized for 5 months secondary to Covid. He was apparently with tilting bed where he was in an upright position quite frequently and apparently this occurred in some way shape or form during that time. Fortunately there is no sign of active infection at this time. No fevers, chills, nausea, vomiting, or diarrhea. With that being said he still has substantial wounds on the plantar aspects of his feet Theragen require quite a bit of work to get these to heal. He has been using Santyl currently though that is been  problematic both in receiving the medication as well as actually paid for it as it is become quite expensive. Prior to the experience with Covid the patient really did not have any major medical problems other than hypertension he does have some mild generalized weakness following the Covid experience. 07/22/2020 on evaluation today patient appears to be doing okay in regard to his foot ulcers I feel like the wound beds are showing signs of better improvement that I do believe the Iodoflex is helping in this regard. With that being said he does have a lot of drainage currently and this is somewhat blue/green in nature which is consistent with Pseudomonas. I do think a culture today would be appropriate for [place] to evaluate and see if that is indeed the case I would likely start him on antibiotic orally as well he is not allergic to Cipro knows of no issues he has had in the past 12/21; patient was admitted to the clinic earlier this month with bilateral presumed pressure ulcers on the bottom of his feet apparently related to excessive pressure from a tilt table arrangement in the intensive care unit. Patient relates this to being on ECMO but I am not really sure that is exactly related to that. I must say I have never seen anything like this. He has fairly extensive full-thickness wounds extending from his heel towards his midfoot mostly centered laterally. There is already been some healing distally. He does not appear to have an arterial issue. He has been using gentamicin to the wound surfaces with Iodoflex to help with ongoing debridement 1/6; this is a patient with pressure ulcers on the bottom of his feet related to excessive pressure from a standing position in the intensive care unit. He is complaining of a lot of pain in the right heel. He is not a diabetic. 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 We have been using Iodoflex to help prepare the surfaces of both wounds  for an advanced treatment product. He is nonambulatory spending most of his time in a wheelchair I have asked him not to propel the wheelchair with his heels 1/13; in general his wounds look better not much surface area change we have been using Iodoflex as of last week. I did an x-ray of the right heel as the patient was complaining of pain. I had some thoughts about a stress fracture perhaps Achilles tendon problems however what it showed was erosive changes along the inferior aspect of the calcaneus he now has a MRI booked for 1/20. 1/20; in general his wounds continue to be better. Some improvement in the large narrow areas proximally in his foot. He is still complaining of pain in the right heel and tenderness in certain areas of this wound. His MRI is tonight. I am not just looking for osteomyelitis that was brought up on the x-ray I am wondering about stress fractures, tendon ruptures etc. He has no such findings on the left. Also noteworthy is that the patient had critical illness neuropathy and some of the discomfort may be actual improvement in nerve function I am just not sure. These wounds were initially in the setting of severe critical illness related to COVID-19. He was put in a standing position. He may have also been on pressors at the point contributing to tissue ischemia. By his description at some point these wounds were grossly necrotic extending proximally up into the Achilles part of his heel. I do not know that I have ever really seen pictures of them like this although they may exist in epic We have ordered Tri layer Oasis. I am trying to stimulate some granulation in these areas. This is of course assuming the MRI is negative for infection 1/27; since the patient was last here he saw Dr. Juleen China of infectious disease. He is planned for vancomycin and ceftriaxone. Prior operative culture grew MSSA. Also ordered baseline lab work. He also ordered arterial studies although the  ABIs in our clinic were normal as well as his clinical exam these were normal I do not think he needs to see vascular surgery. His ABIs at the PTA were 1.22 in the right triphasic waveforms with a normal TBI of 1.15 on the left ABI of 1.22 with triphasic waveforms and a normal TBI of 1.08. Finally he saw Dr. Amalia Hailey who will follow him in for 2 months. At this point I do not think he felt that he needed a procedure on the right calcaneal bone. Dr. Juleen China is elected for broad-spectrum antibiotic The patient is still having pain in the right heel. He walks with a walker 2/3; wounds are generally smaller. He is tolerating his IV antibiotics. I believe this is vancomycin and ceftriaxone. We are still waiting for Oasis burn in terms of his out-of-pocket max which he should be meeting soon given the IV antibiotics, MRIs etc. I have asked him to check in on this. We are using silver collagen in the meantime the wounds look better 2/10; tolerating IV vancomycin and Rocephin. We are waiting to apply for Oasis. Although I am not really sure where he is in his out-of-pocket max. 2/17 started the first application of Oasis trilayer. Still on antibiotics. The wounds have generally look better. The area on the left has a little more surface slough requiring debridement 8/46; second application of Oasis trilayer.  The wound surface granulation is generally look better. The area on the left with undermining laterally I think is come in a bit. 10/08/2020 upon evaluation today patient is here today for [employer] application #3. Fortunately he seems to be doing extremely well with regard to this and we are seeing a lot of new epithelial growth which is great news. Fortunately there is no signs of active infection at this time. 10/16/2020 upon evaluation today patient appears to be doing well with regard to his foot ulcers. Do believe the Oasis has been of benefit for him. I do not see any signs of infection right now  which is great news and I think that he has a lot of new epithelial growth which is great to see as well. The patient is very pleased to hear all of this. I do think we can proceed with the Oasis trilayer #4 today. 3/18; not as much improvement in these areas on his heels that I was hoping. I did reapply trilateral Oasis today the tissue looks healthier but not as much fill in as I was hoping. 3/25; better looking today I think this is come in a bit the tissue looks healthier. Triple layer Oasis reapplied #6 4/1; somewhat better looking definitely better looking surface not as much change in surface area as I was hoping. He may be spending more time [place] on days then he needs to although he does have heel offloading boots. Triple layer Oasis reapplied #7 4/7; unfortunately apparently [facility] will not approve any further Oasis which is unfortunate since the patient did respond nicely both in terms of the condition of the wound bed as well as surface area. There is still some drainage coming from the wound but not a lot there does not appear to be any infection 4/15; we have been using Hydrofera Blue. He continues to have nice rims of epithelialization on the right greater than the left. The left the epithelialization is coming from the tip of his heel. There is moderate drainage. In this that concerns me about a total contact cast. There is no evidence of infection 4/29; patient has been using Hydrofera Blue with dressing changes. He has no complaints or issues today. 5/5; using Hydrofera Blue. I actually think that he looks marginally better than the last time I saw this 3 weeks ago. There are rims of epithelialization on the left thumb coming from the medial side on the right. Using Hydrofera Blue 5/12; using Hydrofera Blue. These continue to make improvements in surface area. His drainage was not listed as severe I therefore went ahead and put a cast on the left foot. Right foot we  will continue to dress his previous 5/16; back for first total contact cast change. He did not tolerate this particularly well cast injury on the anterior tibia among other issues. Difficulty sleeping. I talked him about this in some detail and afterwards is elected to continue. I told him I would like to have a cast on for 3 weeks to see if this is going to help at all. I think he agreed 5/19; I think the wound is better. There is no tunneling towards his midfoot. The undermining medially also looks better. He has a rim of new skin distally. I think we are making progress here. The area on the left also continues to look somewhat better to me using Hydrofera Blue. He has a list of complaints about the cast but none of them seem  serious 5/26; patient presents for 1 week follow-up. He has been using a total contact cast and tolerating this well. Hydrofera Blue is the main dressing used. He denies signs of infection. 6/2 Hydrofera Blue total contact cast on the left. These were large ulcers that formed in intensive care unit where the patient was recovering from [place]. May have had something to do with being ventilated in an upright positiono Pressors etc. We have been able to get the areas down considerably and a viable surface. There is some epithelialization in both sides. Note made of drainage 6/9; changed to [facility] last time because of drainage. He arrives with better looking surfaces and dimensions on the left than the right. Paradoxically the right actually probes more towards his midfoot the left is largely close down but both of these look improved. Using a total contact cast on the left 6/16; complex wounds on his bilateral plantar heels which were initially pressure injury from a stay in the ICU with COVID. We have been using silver alginate most recently. His dimensions of come in quite dramatically however not recently. We have been putting the left foot in a total contact  cast 6/23; complex wounds on the bilateral plantar heels. I been putting the left in the cast paradoxically the area on the right is the one that is going towards closure at a faster rate. Quite a bit of drainage on the left. The patient went to see Dr. Amalia Hailey who said he was going to standby for skin grafts. I had actually considered sending him for skin grafts however he would be mandatorily off his feet for a period of weeks to months. I am thinking that the area on the right is going to close on its own the area on the left has been more stubborn even though we have him in a total contact cast 6/30; took him out of a total contact cast last week is the right heel seem to be making better progress than the left where I was placing the cast. We are using silver alginate. Both wounds are smaller right greater than left 7/12; both wounds look as though they are making some progress. We are using silver alginate. Heel offloading boots 7/26; very gradual progress especially on the right. Using silver alginate. He is wearing heel offloading boots 8/18; he continues to close these wounds down very gradually. Using silver alginate. The problem polymen being definitive about this is areas of what appears to be callus around the margins. This is not a 100% of the area but certainly sizable especially on the right 9/1; bilateral plantar feet wounds secondary to prolonged pressure while being ventilated for COVID-19 in an upright position. Essentially pressure ulcers on the bottom of his feet. He is made substantial progress using silver alginate. 9/14; bilateral plantar feet wounds secondary to prolonged pressure. Making progress using silver alginate. 9/29 bilateral plantar feet wounds secondary to prolonged pressure. I changed him to Iodoflex last week. MolecuLight showing reddened blush fluorescence 10/11; patient presents for follow-up. He has no issues or complaints today. He denies signs of infection.  He continues to use Iodoflex and antibiotic ointment to the wound beds. 10/27; 2-week follow-up. No evidence of infection. He has callus and thick dry skin around the wound margins we have been using Iodoflex and Bactroban which was in response to a moderate left MolecuLight reddish blush fluorescence. 11/10; 2-week follow-up. Wound margins again have thick callus however the measurements of the actual wound sites  are a lot smaller. Everything looks reasonably healthy here. We have been using Iodoflex He was approved for prime matrix but I have elected to delay this given the improvement in the surface area. Hopefully I will not regret that decision as were getting close to the end of the year in terms of insurance payment 12/8; 2-week follow-up. Wounds are generally smaller in size. These were initially substantial wounds extending into the forefoot all the way into the heel on the bilateral plantar feet. They are now both located on the plantar heel distal aspect both of these have a lot of callus around the wounds I used a #5 curette to remove this on the right and the left also some subcutaneous debris to try and get the wound edges were using Iodoflex. He has heel offloading shoe 12/22; 2-week follow-up. Not really much improvement. He has thick callus around the outer edges of both wounds. I remove this there is some nonviable subcutaneous tissue as well. We have been using Iodoflex. Her intake nurse and myself spontaneously thought of a total contact cast I went back in May. At that time we really were not seeing much of an improvement with a cast although the wound was in a much different situation I would like to retry this in 2 weeks and I discussed this with the patient 08/12/2021; the patient has had some improvement with the Iodoflex. The the area on the left heel plantar more improved than the right. I had to put him in a total contact cast on the left although I decided to put that off  for 2 weeks. I am going to change his primary dressing to silver collagen. I think in both areas he has had some improvement most of the healing seems to be more proximal in the heel. The wounds are in the mid aspect. A lot of thick callus on the right heel however. 1/19; we are using silver collagen on both plantar heel areas. He has had some improvement today. The left did not require any debridement. He still had some eschar on the right that was debrided but both seem to have contracted. I did not put it total contact cast on him today 2/2 we have been using silver collagen. The area on the right plantar heel has areas that appear to be epithelialized interspersed with dry flaking callus and dry skin. I removed this. This really looks better than on the other side. On the left still a large area with raised edges and debris on the surface. 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 He has no issues or complaints today. Electronic Signature(s) Signed: 09/13/2021 11:51:43 AM By: Kalman Shan DO Entered By: Kalman Shan on 09/13/2021 11:47:05 -------------------------------------------------------------------------------- Physical Exam Details Patient Name: Date of Service: [name], [place] 09/13/2021 9:45 A M Medical Record Number: [id number] Patient Account Number: [id number] Date of Birth/Sex: Treating RN: 1975-10-02 (46 y.o. [name] Primary Care Provider: [name] Other Clinician: Referring Provider: Treating Provider/Extender: [name] in Treatment: 60 Constitutional respirations regular, non-labored and within target range for patient.. Cardiovascular 2+ dorsalis pedis/posterior tibialis pulses. Psychiatric pleasant and cooperative. Notes Left heel: Unchanged from previous clinic visit. Adherent fibrinous  debris on surface with callus around the edges. No signs of infection. Electronic Signature(s) Signed: 09/13/2021 11:51:43 AM By: Kalman Shan DO  Entered By: Kalman Shan on 09/13/2021 11:47:59 -------------------------------------------------------------------------------- Physician Orders Details Patient Name: Date of Service: [name] [place] 09/13/2021 9:45 A M Medical Record Number: [id number] Patient Account Number: [id number] Date of Birth/Sex: Treating RN: 1976/02/22 (46 y.o. [name] Primary Care Provider: [name] Other Clinician: Referring Provider: Treating Provider/Extender: [name] in Treatment: 42 Verbal / Phone Orders: No Diagnosis Coding ICD-10 Coding Code Description [phone] Non-pressure chronic ulcer of other part of right foot with fat layer exposed L97.522 Non-pressure chronic ulcer of other part of left foot with fat layer exposed Follow-up Appointments ppointment in 1 week. - Dr Dellia Nims Thursday Return A Bathing/ Shower/ Hygiene May shower with protection but do not get wound dressing(s) wet. - Cover with cast protector (can purchase cast protector at [facility] or [facility] ) Edema Control - Lymphedema / SCD / Other Bilateral Lower Extremities Elevate legs to the level of the heart or above for 30 minutes daily and/or when sitting, a frequency of: - throughout the day Avoid standing for long periods of time. Moisturize legs daily. - right leg and foot every night. Off-Loading Total Contact Cast to Left Lower Extremity - TCC-EZ cast to Left Lower Leg Wedge shoe to: - Glophed Shoe to right foot Other: - keep pressure off of the bottom of your feet Additional Orders / Instructions Follow Nutritious Diet Wound Treatment Wound #1 - Calcaneus Wound Laterality: Right Cleanser: Normal Saline (Generic) Every Other Day/30 Days Discharge Instructions: Cleanse the wound with Normal Saline prior to applying a clean dressing  using gauze sponges, not tissue or cotton balls. Cleanser: Wound Cleanser Every Other Day/30 Days Discharge Instructions: Cleanse the wound with wound cleanser prior to applying a clean dressing using gauze sponges, not tissue or cotton balls. Prim Dressing: Promogran Prisma Matrix, 4.34 (sq in) (silver collagen) (Generic) Every Other Day/30 Days ary Discharge Instructions: Moisten collagen with saline or hydrogel Secured With: Elastic Bandage 4 inch (ACE bandage) (Generic) Every Other Day/30 Days Discharge Instructions: Secure with ACE bandage as directed. Secured With: [employer], 4.5x3.1 (in/yd) (Generic) Every Other Day/30 Days Discharge Instructions: Secure with Kerlix as directed. Wound #2 - Calcaneus Wound Laterality: Left Cleanser: Normal Saline (Generic) Every Other Day/30 Days Discharge Instructions: Cleanse the wound with Normal Saline prior to applying a clean dressing using gauze sponges, not tissue or cotton balls. Cleanser: Wound Cleanser Every Other Day/30 Days Discharge Instructions: Cleanse the wound with wound cleanser prior to applying a clean dressing using gauze sponges, not tissue or cotton balls. Secured With: [employer], 4.5x3.1 (in/yd) (Generic) Every Other Day/30 Days Discharge Instructions: Secure with Kerlix as directed. Electronic Signature(s) Signed: 09/13/2021 11:51:43 AM By: Kalman Shan DO Entered By: Kalman Shan on 09/13/2021 11:49:46 -------------------------------------------------------------------------------- Problem List Details Patient Name: Date of Service: [name], [place] 09/13/2021 9:45 A M Medical Record Number: [id number] Patient Account Number: [id number] Date of Birth/Sex: Treating RN: 11/11/1975 (46 y.o. [name] Primary Care Provider: [name] Other Clinician: Referring Provider: Treating Provider/Extender: [name] in Treatment: 70 Active  Problems ICD-10 Encounter Code Description Active Date MDM Diagnosis L97.512 Non-pressure chronic ulcer of other part of right foot with fat layer exposed 09/03/2020 No Yes L97.522 Non-pressure chronic ulcer of other part of left foot with fat layer exposed 09/03/2020 No Yes Inactive Problems ICD-10 Code Description Active Date Inactive Date L89.893 Pressure ulcer of other site, stage 3 07/15/2020 07/15/2020 M62.81 Muscle weakness (generalized) 07/15/2020 07/15/2020 I10 Essential (primary)  hypertension 07/15/2020 07/15/2020 M86.171 Other acute osteomyelitis, right ankle and foot 09/03/2020 09/03/2020 Resolved Problems Electronic Signature(s) Signed: 09/13/2021 11:51:43 AM By: Kalman Shan DO Entered By: Kalman Shan on 09/13/2021 11:44:59 -------------------------------------------------------------------------------- Progress Note Details Patient Name: Date of Service: [name], [place] 09/13/2021 9:45 A M Medical Record Number: [id number] Patient Account Number: [id number] Date of Birth/Sex: Treating RN: 10-09-1975 (46 y.o. [name] Primary Care Provider: [name] Other Clinician: Referring Provider: Treating Provider/Extender: [name] in Treatment: 56 Subjective Chief Complaint Information obtained from Patient Bilateral Plantar Foot Ulcers History of Present Illness (HPI) Wounds are12/03/2020 upon evaluation today patient presents for initial inspection here in our clinic concerning issues he has been having with the bottoms of his feet bilaterally. He states these actually occurred as wounds when he was hospitalized for 5 months secondary to Covid. He was apparently with tilting bed where he was in an upright position quite frequently and apparently this occurred in some way shape or form during that time. Fortunately there is no sign of active infection at this time. No fevers, chills, nausea, vomiting, or diarrhea. With that being said he  still has substantial wounds on the plantar aspects of his feet Theragen require quite a bit of work to get these to heal. He has been using Santyl currently though that is been problematic both in receiving the medication as well as actually paid for it as it is become quite expensive. Prior to the experience with Covid the patient really did not have any major medical problems other than hypertension he does have some mild generalized weakness following the Covid experience. 07/22/2020 on evaluation today patient appears to be doing okay in regard to his foot ulcers I feel like the wound beds are showing signs of better improvement that I do believe the Iodoflex is helping in this regard. With that being said he does have a lot of drainage currently and this is somewhat blue/green in nature which is consistent with Pseudomonas. I do think a culture today would be appropriate for [place] to evaluate and see if that is indeed the case I would likely start him on antibiotic orally as well he is not allergic to Cipro knows of no issues he has had in the past 12/21; patient was admitted to the clinic earlier this month with bilateral presumed pressure ulcers on the bottom of his feet apparently related to excessive pressure from a tilt table arrangement in the intensive care unit. Patient relates this to being on ECMO but I am not really sure that is exactly related to that. I must say I have never seen anything like this. He has fairly extensive full-thickness wounds extending from his heel towards his midfoot mostly centered laterally. There is already been some healing distally. He does not appear to have an arterial issue. He has been using gentamicin to the wound surfaces with Iodoflex to help with ongoing debridement 1/6; this is a patient with pressure ulcers on the bottom of his feet related to excessive pressure from a standing position in the intensive care unit. He is complaining of a lot of pain in  the right heel. He is not a diabetic. 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 We have been using Iodoflex to help prepare the surfaces of both wounds for an advanced treatment product. He is nonambulatory spending most of his time in a wheelchair I have asked him not to propel the wheelchair with his  heels 1/13; in general his wounds look better not much surface area change we have been using Iodoflex as of last week. I did an x-ray of the right heel as the patient was complaining of pain. I had some thoughts about a stress fracture perhaps Achilles tendon problems however what it showed was erosive changes along the inferior aspect of the calcaneus he now has a MRI booked for 1/20. 1/20; in general his wounds continue to be better. Some improvement in the large narrow areas proximally in his foot. He is still complaining of pain in the right heel and tenderness in certain areas of this wound. His MRI is tonight. I am not just looking for osteomyelitis that was brought up on the x-ray I am wondering about stress fractures, tendon ruptures etc. He has no such findings on the left. Also noteworthy is that the patient had critical illness neuropathy and some of the discomfort may be actual improvement in nerve function I am just not sure. These wounds were initially in the setting of severe critical illness related to COVID-19. He was put in a standing position. He may have also been on pressors at the point contributing to tissue ischemia. By his description at some point these wounds were grossly necrotic extending proximally up into the Achilles part of his heel. I do not know that I have ever really seen pictures of them like this although they may exist in epic We have ordered Tri layer Oasis. I am trying to stimulate some granulation in these areas. This is of course assuming the MRI is negative for infection 1/27; since the patient was last here he saw Dr. Juleen China of  infectious disease. He is planned for vancomycin and ceftriaxone. Prior operative culture grew MSSA. Also ordered baseline lab work. He also ordered arterial studies although the ABIs in our clinic were normal as well as his clinical exam these were normal I do not think he needs to see vascular surgery. His ABIs at the PTA were 1.22 in the right triphasic waveforms with a normal TBI of 1.15 on the left ABI of 1.22 with triphasic waveforms and a normal TBI of 1.08. Finally he saw Dr. Amalia Hailey who will follow him in for 2 months. At this point I do not think he felt that he needed a procedure on the right calcaneal bone. Dr. Juleen China is elected for broad-spectrum antibiotic The patient is still having pain in the right heel. He walks with a walker 2/3; wounds are generally smaller. He is tolerating his IV antibiotics. I believe this is vancomycin and ceftriaxone. We are still waiting for Oasis burn in terms of his out-of-pocket max which he should be meeting soon given the IV antibiotics, MRIs etc. I have asked him to check in on this. We are using silver collagen in the meantime the wounds look better 2/10; tolerating IV vancomycin and Rocephin. We are waiting to apply for Oasis. Although I am not really sure where he is in his out-of-pocket max. 2/17 started the first application of Oasis trilayer. Still on antibiotics. The wounds have generally look better. The area on the left has a little more surface slough requiring debridement 2/67; second application of Oasis trilayer. The wound surface granulation is generally look better. The area on the left with undermining laterally I think is come in a bit. 10/08/2020 upon evaluation today patient is here today for [employer] application #3. Fortunately he seems to be doing extremely well with regard to this  and we are seeing a lot of new epithelial growth which is great news. Fortunately there is no signs of active infection at this time. 10/16/2020  upon evaluation today patient appears to be doing well with regard to his foot ulcers. Do believe the Oasis has been of benefit for him. I do not see any signs of infection right now which is great news and I think that he has a lot of new epithelial growth which is great to see as well. The patient is very pleased to hear all of this. I do think we can proceed with the Oasis trilayer #4 today. 3/18; not as much improvement in these areas on his heels that I was hoping. I did reapply trilateral Oasis today the tissue looks healthier but not as much fill in as I was hoping. 3/25; better looking today I think this is come in a bit the tissue looks healthier. Triple layer Oasis reapplied #6 4/1; somewhat better looking definitely better looking surface not as much change in surface area as I was hoping. He may be spending more time [place] on days then he needs to although he does have heel offloading boots. Triple layer Oasis reapplied #7 4/7; unfortunately apparently [facility] will not approve any further Oasis which is unfortunate since the patient did respond nicely both in terms of the condition of the wound bed as well as surface area. There is still some drainage coming from the wound but not a lot there does not appear to be any infection 4/15; we have been using Hydrofera Blue. He continues to have nice rims of epithelialization on the right greater than the left. The left the epithelialization is coming from the tip of his heel. There is moderate drainage. In this that concerns me about a total contact cast. There is no evidence of infection 4/29; patient has been using Hydrofera Blue with dressing changes. He has no complaints or issues today. 5/5; using Hydrofera Blue. I actually think that he looks marginally better than the last time I saw this 3 weeks ago. There are rims of epithelialization on the left thumb coming from the medial side on the right. Using Hydrofera Blue 5/12;  using Hydrofera Blue. These continue to make improvements in surface area. His drainage was not listed as severe I therefore went ahead and put a cast on the left foot. Right foot we will continue to dress his previous 5/16; back for first total contact cast change. He did not tolerate this particularly well cast injury on the anterior tibia among other issues. Difficulty sleeping. I talked him about this in some detail and afterwards is elected to continue. I told him I would like to have a cast on for 3 weeks to see if this is going to help at all. I think he agreed 5/19; I think the wound is better. There is no tunneling towards his midfoot. The undermining medially also looks better. He has a rim of new skin distally. I think we are making progress here. The area on the left also continues to look somewhat better to me using Hydrofera Blue. He has a list of complaints about the cast but none of them seem serious 5/26; patient presents for 1 week follow-up. He has been using a total contact cast and tolerating this well. Hydrofera Blue is the main dressing used. He denies signs of infection. 6/2 Hydrofera Blue total contact cast on the left. These were large ulcers that formed in  intensive care unit where the patient was recovering from [place]. May have had something to do with being ventilated in an upright positiono Pressors etc. We have been able to get the areas down considerably and a viable surface. There is some epithelialization in both sides. Note made of drainage 6/9; changed to [facility] last time because of drainage. He arrives with better looking surfaces and dimensions on the left than the right. Paradoxically the right actually probes more towards his midfoot the left is largely close down but both of these look improved. Using a total contact cast on the left 6/16; complex wounds on his bilateral plantar heels which were initially pressure injury from a stay in the ICU with  COVID. We have been using silver alginate most recently. His dimensions of come in quite dramatically however not recently. We have been putting the left foot in a total contact cast 6/23; complex wounds on the bilateral plantar heels. I been putting the left in the cast paradoxically the area on the right is the one that is going towards closure at a faster rate. Quite a bit of drainage on the left. The patient went to see Dr. Amalia Hailey who said he was going to standby for skin grafts. I had actually considered sending him for skin grafts however he would be mandatorily off his feet for a period of weeks to months. I am thinking that the area on the right is going to close on its own the area on the left has been more stubborn even though we have him in a total contact cast 6/30; took him out of a total contact cast last week is the right heel seem to be making better progress than the left where I was placing the cast. We are using silver alginate. Both wounds are smaller right greater than left 7/12; both wounds look as though they are making some progress. We are using silver alginate. Heel offloading boots 7/26; very gradual progress especially on the right. Using silver alginate. He is wearing heel offloading boots 8/18; he continues to close these wounds down very gradually. Using silver alginate. The problem polymen being definitive about this is areas of what appears to be callus around the margins. This is not a 100% of the area but certainly sizable especially on the right 9/1; bilateral plantar feet wounds secondary to prolonged pressure while being ventilated for COVID-19 in an upright position. Essentially pressure ulcers on the bottom of his feet. He is made substantial progress using silver alginate. 9/14; bilateral plantar feet wounds secondary to prolonged pressure. Making progress using silver alginate. 9/29 bilateral plantar feet wounds secondary to prolonged pressure. I changed him  to Iodoflex last week. MolecuLight showing reddened blush fluorescence 10/11; patient presents for follow-up. He has no issues or complaints today. He denies signs of infection. He continues to use Iodoflex and antibiotic ointment to the wound beds. 10/27; 2-week follow-up. No evidence of infection. He has callus and thick dry skin around the wound margins we have been using Iodoflex and Bactroban which was in response to a moderate left MolecuLight reddish blush fluorescence. 11/10; 2-week follow-up. Wound margins again have thick callus however the measurements of the actual wound sites are a lot smaller. Everything looks reasonably healthy here. We have been using Iodoflex He was approved for prime matrix but I have elected to delay this given the improvement in the surface area. Hopefully I will not regret that decision as were getting close to the end  of the year in terms of insurance payment 12/8; 2-week follow-up. Wounds are generally smaller in size. These were initially substantial wounds extending into the forefoot all the way into the heel on the bilateral plantar feet. They are now both located on the plantar heel distal aspect both of these have a lot of callus around the wounds I used a #5 curette to remove this on the right and the left also some subcutaneous debris to try and get the wound edges were using Iodoflex. He has heel offloading shoe 12/22; 2-week follow-up. Not really much improvement. He has thick callus around the outer edges of both wounds. I remove this there is some nonviable subcutaneous tissue as well. We have been using Iodoflex. Her intake nurse and myself spontaneously thought of a total contact cast I went back in May. At that time we really were not seeing much of an improvement with a cast although the wound was in a much different situation I would like to retry this in 2 weeks and I discussed this with the patient 08/12/2021; the patient has had some  improvement with the Iodoflex. The the area on the left heel plantar more improved than the right. I had to put him in a total contact cast on the left although I decided to put that off for 2 weeks. I am going to change his primary dressing to silver collagen. I think in both areas he has had some improvement most of the healing seems to be more proximal in the heel. The wounds are in the mid aspect. A lot of thick callus on the right heel however. 1/19; we are using silver collagen on both plantar heel areas. He has had some improvement today. The left did not require any debridement. He still had some eschar on the right that was debrided but both seem to have contracted. I did not put it total contact cast on him today 2/2 we have been using silver collagen. The area on the right plantar heel has areas that appear to be epithelialized interspersed with dry flaking callus and dry skin. I removed this. This really looks better than on the other side. On the left still a large area with raised edges and debris on the surface. 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 He has no issues or complaints today. Patient History Information obtained from Patient. Family History Cancer - Maternal Grandparents, Diabetes - Father,Paternal Grandparents, Heart Disease - Maternal Grandparents, Hypertension - Father,Paternal Grandparents, Lung Disease - Siblings, No family history of Hereditary Spherocytosis, Kidney Disease, Seizures, Stroke, Thyroid Problems, Tuberculosis. Social History Never smoker, Marital Status - Married, Alcohol Use - Never, Drug Use - No History, Caffeine Use - Daily - tea, soda. Medical History Eyes Denies history of Cataracts, Glaucoma, Optic Neuritis Ear/Nose/Mouth/Throat Denies history of Chronic sinus problems/congestion, Middle ear  problems Hematologic/Lymphatic Denies history of Anemia, Hemophilia, Human Immunodeficiency Virus, Lymphedema, Sickle Cell Disease Respiratory Patient has history of Asthma Denies history of Aspiration, Chronic Obstructive Pulmonary Disease (COPD), Pneumothorax, Sleep Apnea, Tuberculosis Cardiovascular Patient has history of Angina - with COVID, Hypertension Denies history of Arrhythmia, Congestive Heart Failure, Coronary Artery Disease, Deep Vein Thrombosis, Hypotension, Myocardial Infarction, Peripheral Arterial Disease, Peripheral Venous Disease, Phlebitis, Vasculitis Gastrointestinal Denies history of Cirrhosis , Colitis, Crohnoos, Hepatitis A, Hepatitis B, Hepatitis C Endocrine Denies history of Type I Diabetes,  Type II Diabetes Genitourinary Denies history of End Stage Renal Disease Immunological Denies history of Lupus Erythematosus, Raynaudoos, Scleroderma Integumentary (Skin) Denies history of History of Burn Musculoskeletal Denies history of Gout, Rheumatoid Arthritis, Osteoarthritis, Osteomyelitis Neurologic Denies history of Dementia, Neuropathy, Quadriplegia, Paraplegia, Seizure Disorder Oncologic Denies history of Received Chemotherapy, Received Radiation Psychiatric Denies history of Anorexia/bulimia, Confinement Anxiety Hospitalization/Surgery History - COVID PNA 07/22/2019- 11/14/2019. - 03/27/2020 wound debridement/ skin graft. Medical A Surgical History Notes nd Constitutional Symptoms (General Health) COVID PNA 07/22/2019-11/14/2019 VENT ECMO, foot drop left foot , Genitourinary kidney stone Psychiatric anxiety Objective Constitutional respirations regular, non-labored and within target range for patient.. Vitals Time Taken: 9:38 AM, Height: 69 in, Source: Stated, Weight: 280 lbs, Source: Stated, BMI: 41.3, Temperature: 98.3 F, Pulse: 81 bpm, Respiratory Rate: 18 breaths/min, Blood Pressure: 149/87 mmHg. Cardiovascular 2+ dorsalis pedis/posterior  tibialis pulses. Psychiatric pleasant and cooperative. General Notes: Left heel: Unchanged from previous clinic visit. Adherent fibrinous debris on surface with callus around the edges. No signs of infection. Integumentary (Hair, Skin) Wound #1 status is Open. Original cause of wound was Pressure Injury. The date acquired was: 10/07/2019. The wound has been in treatment 60 weeks. The wound is located on the Right Calcaneus. The wound measures 3cm length x 1cm width x 0.5cm depth; 2.356cm^2 area and 1.178cm^3 volume. There is a medium amount of serosanguineous drainage noted. Wound #2 status is Open. Original cause of wound was Pressure Injury. The date acquired was: 10/07/2019. The wound has been in treatment 60 weeks. The wound is located on the Left Calcaneus. The wound measures 3cm length x 0.7cm width x 0.5cm depth; 1.649cm^2 area and 0.825cm^3 volume. There is a medium amount of serosanguineous drainage noted. Assessment Active Problems ICD-10 Non-pressure chronic ulcer of other part of right foot with fat layer exposed Non-pressure chronic ulcer of other part of left foot with fat layer exposed This patient follows closely with Dr. Dellia Nims. Patient presents for his first cast exchange. He reports no issues with the first cast placement. No signs of infection. We will continue with collagen under the cast. Procedures Wound #2 Pre-procedure diagnosis of Wound #2 is a Pressure Ulcer located on the Left Calcaneus . There was a T Programmer, multimedia Procedure by Kalman Shan, otal DO. Post procedure Diagnosis Wound #2: Same as Pre-Procedure Plan Follow-up Appointments: Return Appointment in 1 week. - Dr Dellia Nims Thursday Bathing/ Shower/ Hygiene: May shower with protection but do not get wound dressing(s) wet. - Cover with cast protector (can purchase cast protector at [facility] or [facility] ) Edema Control - Lymphedema / SCD / Other: Elevate legs to the level of the heart or above for 30 minutes  daily and/or when sitting, a frequency of: - throughout the day Avoid standing for long periods of time. Moisturize legs daily. - right leg and foot every night. Off-Loading: T Contact Cast to Left Lower Extremity - TCC-EZ cast to Left Lower Leg otal Wedge shoe to: - Glophed Shoe to right foot Other: - keep pressure off of the bottom of your feet Additional Orders / Instructions: Follow Nutritious Diet WOUND #1: - Calcaneus Wound Laterality: Right Cleanser: Normal Saline (Generic) Every Other Day/30 Days Discharge Instructions: Cleanse the wound with Normal Saline prior to applying a clean dressing using gauze sponges, not tissue or cotton balls. Cleanser: Wound Cleanser Every Other Day/30 Days Discharge Instructions: Cleanse the wound with wound cleanser prior to applying a clean dressing using gauze sponges, not tissue or cotton balls. Prim Dressing: Promogran Prisma  Matrix, 4.34 (sq in) (silver collagen) (Generic) Every Other Day/30 Days ary Discharge Instructions: Moisten collagen with saline or hydrogel Secured With: Elastic Bandage 4 inch (ACE bandage) (Generic) Every Other Day/30 Days Discharge Instructions: Secure with ACE bandage as directed. Secured With: [employer], 4.5x3.1 (in/yd) (Generic) Every Other Day/30 Days Discharge Instructions: Secure with Kerlix as directed. WOUND #2: - Calcaneus Wound Laterality: Left Cleanser: Normal Saline (Generic) Every Other Day/30 Days Discharge Instructions: Cleanse the wound with Normal Saline prior to applying a clean dressing using gauze sponges, not tissue or cotton balls. Cleanser: Wound Cleanser Every Other Day/30 Days Discharge Instructions: Cleanse the wound with wound cleanser prior to applying a clean dressing using gauze sponges, not tissue or cotton balls. Secured With: [employer], 4.5x3.1 (in/yd) (Generic) Every Other Day/30 Days Discharge Instructions: Secure with Kerlix as directed. 1. Collagen 2. TCC  placed in standard fashion 3. Follow-up this week with Dr. Dellia Nims Electronic Signature(s) Signed: 09/13/2021 11:51:43 AM By: Kalman Shan DO Entered By: Kalman Shan on 09/13/2021 11:51:12 -------------------------------------------------------------------------------- HxROS Details Patient Name: Date of Service: [name], [place] 09/13/2021 9:45 A M Medical Record Number: [id number] Patient Account Number: [id number] Date of Birth/Sex: Treating RN: 1976-02-03 (46 y.o. [name] Primary Care Provider: [name] Other Clinician: Referring Provider: Treating Provider/Extender: [name] in Treatment: 17 Information Obtained From Patient Constitutional Symptoms (General Health) Medical History: Past Medical History Notes: COVID PNA 07/22/2019-11/14/2019 VENT ECMO, foot drop left foot , Eyes Medical History: Negative for: Cataracts; Glaucoma; Optic Neuritis Ear/Nose/Mouth/Throat Medical History: Negative for: Chronic sinus problems/congestion; Middle ear problems Hematologic/Lymphatic Medical History: Negative for: Anemia; Hemophilia; Human Immunodeficiency Virus; Lymphedema; Sickle Cell Disease Respiratory Medical History: Positive for: Asthma Negative for: Aspiration; Chronic Obstructive Pulmonary Disease (COPD); Pneumothorax; Sleep Apnea; Tuberculosis Cardiovascular Medical History: Positive for: Angina - with COVID; Hypertension Negative for: Arrhythmia; Congestive Heart Failure; Coronary Artery Disease; Deep Vein Thrombosis; Hypotension; Myocardial Infarction; Peripheral Arterial Disease; Peripheral Venous Disease; Phlebitis; Vasculitis Gastrointestinal Medical History: Negative for: Cirrhosis ; Colitis; Crohns; Hepatitis A; Hepatitis B; Hepatitis C Endocrine Medical History: Negative for: Type I Diabetes; Type II Diabetes Genitourinary Medical History: Negative for: End Stage Renal Disease Past Medical History Notes: kidney  stone Immunological Medical History: Negative for: Lupus Erythematosus; Raynauds; Scleroderma Integumentary (Skin) Medical History: Negative for: History of Burn Musculoskeletal Medical History: Negative for: Gout; Rheumatoid Arthritis; Osteoarthritis; Osteomyelitis Neurologic Medical History: Negative for: Dementia; Neuropathy; Quadriplegia; Paraplegia; Seizure Disorder Oncologic Medical History: Negative for: Received Chemotherapy; Received Radiation Psychiatric Medical History: Negative for: Anorexia/bulimia; Confinement Anxiety Past Medical History Notes: anxiety Immunizations Pneumococcal Vaccine: Received Pneumococcal Vaccination: No Implantable Devices None Hospitalization / Surgery History Type of Hospitalization/Surgery COVID PNA 07/22/2019- 11/14/2019 03/27/2020 wound debridement/ skin graft Family and Social History Cancer: Yes - Maternal Grandparents; Diabetes: Yes - Father,Paternal Grandparents; Heart Disease: Yes - Maternal Grandparents; Hereditary Spherocytosis: No; Hypertension: Yes - Father,Paternal Grandparents; Kidney Disease: No; Lung Disease: Yes - Siblings; Seizures: No; Stroke: No; Thyroid Problems: No; Tuberculosis: No; Never smoker; Marital Status - Married; Alcohol Use: Never; Drug Use: No History; Caffeine Use: Daily - tea, soda; Financial Concerns: No; Food, Clothing or Shelter Needs: No; Support System Lacking: No; Transportation Concerns: No Electronic Signature(s) Signed: 09/13/2021 11:51:43 AM By: Kalman Shan DO Signed: 09/13/2021 4:43:35 PM By: Lorrin Jackson Entered By: Kalman Shan on 09/13/2021 11:47:15 -------------------------------------------------------------------------------- Total Contact Cast Details Patient Name: Date of Service: [name], [place] 09/13/2021 9:45 A M Medical Record Number: [id number] Patient Account  Number: [id number] Date of Birth/Sex: Treating RN: [birth year] (46 y.o. [name] Primary Care  Provider: [name] Other Clinician: Referring Provider: Treating Provider/Extender: [name] in Treatment: 55 T Contact Cast Applied for Wound Assessment: otal Wound #2 Left Calcaneus Performed By: Physician Kalman Shan, DO Post Procedure Diagnosis Same as Pre-procedure Electronic Signature(s) Signed: 09/13/2021 11:51:43 AM By: Kalman Shan DO Signed: 09/13/2021 4:37:55 PM By: Baruch Gouty RN, BSN Entered By: Baruch Gouty on 09/13/2021 10:31:24 -------------------------------------------------------------------------------- SuperBill Details Patient Name: Date of Service: [name], [place]. 09/13/2021 Medical Record Number: [id number] Patient Account Number: [id number] Date of Birth/Sex: Treating RN: July 15, 1976 (46 y.o. [name] Primary Care Provider: [name] Other Clinician: Referring Provider: Treating Provider/Extender: [name] in Treatment: 60 Diagnosis Coding ICD-10 Codes Code Description [phone] Non-pressure chronic ulcer of other part of right foot with fat layer exposed L97.522 Non-pressure chronic ulcer of other part of left foot with fat layer exposed Facility Procedures CPT4 Code: [id number] Description: [phone] - APPLY TOTAL CONTACT LEG CAST ICD-10 Diagnosis Description L97.522 Non-pressure chronic ulcer of other part of left foot with fat layer exposed Modifier: Quantity: 1 Physician Procedures : CPT4 Code Description Modifier [id number] - WC PHYS APPLY TOTAL CONTACT CAST ICD-10 Diagnosis Description L97.522 Non-pressure chronic ulcer of other part of left foot with fat layer exposed Quantity: 1 Electronic Signature(s) Signed: 09/13/2021 11:51:43 AM By: Kalman Shan DO Entered By: Kalman Shan on 09/13/2021 11:51:22

## 2021-09-13 NOTE — Progress Notes (Signed)
EDEM, TIEGS ([id number]) Visit Report for 09/13/2021 Arrival Information Details Patient Name: Date of Service: [name], [place] 09/13/2021 9:45 A M Medical Record Number: [id number] Patient Account Number: [id number] Date of Birth/Sex: Treating RN: 07-23-1976 (46 y.o. [name], Vaughan Basta Primary Care Norah Devin: [name] Other Clinician: Referring Brenly Trawick: Treating Radley Teston/Extender: [name] in Treatment: 28 Visit Information History Since Last Visit Added or deleted any medications: No Patient Arrived: Wheel Chair Any new allergies or adverse reactions: No Arrival Time: 09:33 Had a fall or experienced change in No Accompanied By: spouse activities of daily living that may affect Transfer Assistance: Manual risk of falls: Patient Requires Transmission-Based Precautions: No Signs or symptoms of abuse/neglect since last visito No Patient Has Alerts: No Hospitalized since last visit: No Implantable device outside of the clinic excluding No cellular tissue based products placed in the center since last visit: Has Dressing in Place as Prescribed: Yes Has Footwear/Offloading in Place as Prescribed: Yes Left: T Contact Cast otal Pain Present Now: Yes Electronic Signature(s) Signed: 09/13/2021 4:37:55 PM By: Baruch Gouty RN, BSN Entered By: Baruch Gouty on 09/13/2021 09:39:08 -------------------------------------------------------------------------------- Encounter Discharge Information Details Patient Name: Date of Service: [name], [place]. 09/13/2021 9:45 A M Medical Record Number: [id number] Patient Account Number: [id number] Date of Birth/Sex: Treating RN: [birth year] (46 y.o. [name] Primary Care Delontae Lamm: [name] Other Clinician: Referring Renea Schoonmaker: Treating Nimsi Males/Extender: [name] in Treatment: 21 Encounter Discharge Information Items Discharge Condition: Stable Ambulatory Status:  Wheelchair Discharge Destination: Home Transportation: Private Auto Accompanied By: spouse Schedule Follow-up Appointment: Yes Clinical Summary of Care: Patient Declined Electronic Signature(s) Signed: 09/13/2021 4:37:55 PM By: Baruch Gouty RN, BSN Entered By: Baruch Gouty on 09/13/2021 11:39:52 -------------------------------------------------------------------------------- Lower Extremity Assessment Details Patient Name: Date of Service: [name], [place] 09/13/2021 9:45 A M Medical Record Number: [id number] Patient Account Number: [id number] Date of Birth/Sex: Treating RN: 1976/07/29 (46 y.o. [name] Primary Care Missy Baksh: [name] Other Clinician: Referring Rosamaria Donn: Treating Jahson Emanuele/Extender: [name] in Treatment: 60 Edema Assessment Assessed: [name]: No] [name]: No] Edema: [Left: No] [Right: No] Calf Left: Right: Point of Measurement: 29 cm From Medial Instep 42 cm 42.9 cm Ankle Left: Right: Point of Measurement: 9 cm From Medial Instep 25 cm 24 cm Vascular Assessment Pulses: Dorsalis Pedis Palpable: [Left:Yes] Electronic Signature(s) Signed: 09/13/2021 4:37:55 PM By: Baruch Gouty RN, BSN Entered By: Baruch Gouty on 09/13/2021 09:52:53 -------------------------------------------------------------------------------- Multi Wound Chart Details Patient Name: Date of Service: [name], [place] 09/13/2021 9:45 A M Medical Record Number: [id number] Patient Account Number: [id number] Date of Birth/Sex: Treating RN: 03-12-1976 (46 y.o. [name] Primary Care Ferrell Flam: [name] Other Clinician: Referring Barnie Sopko: Treating Camera Krienke/Extender: [name] in Treatment: 60 Vital Signs Height(in): 69 Pulse(bpm): 81 Weight(lbs): 280 Blood Pressure(mmHg): 149/87 Body Mass Index(BMI): 41.3 Temperature(F): 98.3 Respiratory Rate(breaths/min): 18 Photos: [1:No Photos Right Calcaneus] [2:No  Photos Left Calcaneus] [N/A:N/A N/A] Wound Location: [1:Pressure Injury] [2:Pressure Injury] [N/A:N/A] Wounding Event: [1:Pressure Ulcer] [2:Pressure Ulcer] [N/A:N/A] Primary Etiology: [1:10/07/2019] [2:10/07/2019] [N/A:N/A] Date Acquired: [1:60] [2:60] [N/A:N/A] Weeks of Treatment: [1:Open] [2:Open] [N/A:N/A] Wound Status: [1:No] [2:No] [N/A:N/A] Wound Recurrence: [1:3x1x0.5] [2:3x0.7x0.5] [N/A:N/A] Measurements L x W x D (cm) [1:2.356] [2:1.649] [N/A:N/A] A (cm) : rea [1:1.178] [2:0.825] [N/A:N/A] Volume (cm) : [1:92.50%] [2:93.90%] [N/A:N/A] % Reduction in A rea: [1:96.30%] [2:97.00%] [N/A:N/A] % Reduction in Volume: [1:Category/Stage III] [2:Category/Stage III] [N/A:N/A] Classification: [1:Medium] [2:Medium] [N/A:N/A] Exudate A  mount: [1:Serosanguineous] [2:Serosanguineous] [N/A:N/A] Exudate Type: [1:red, brown] [2:red, brown] [N/A:N/A] Exudate Color: [1:N/A] [2:T Contact Cast otal] [N/A:N/A] Treatment Notes Wound #1 (Calcaneus) Wound Laterality: Right Cleanser Normal Saline Discharge Instruction: Cleanse the wound with Normal Saline prior to applying a clean dressing using gauze sponges, not tissue or cotton balls. Wound Cleanser Discharge Instruction: Cleanse the wound with wound cleanser prior to applying a clean dressing using gauze sponges, not tissue or cotton balls. Peri-Wound Care Topical Primary Dressing Promogran Prisma Matrix, 4.34 (sq in) (silver collagen) Discharge Instruction: Moisten collagen with saline or hydrogel Secondary Dressing Woven Gauze Sponge, Non-Sterile 4x4 in Discharge Instruction: Apply over primary dressing as directed. Zetuvit Plus 4x8 in Discharge Instruction: Apply over primary dressing as directed. Secured With Elastic Bandage 4 inch (ACE bandage) Discharge Instruction: Secure with ACE bandage as directed. Kerlix Roll Sterile, 4.5x3.1 (in/yd) Discharge Instruction: Secure with Kerlix as directed. Compression Wrap Compression  Stockings Add-Ons Wound #2 (Calcaneus) Wound Laterality: Left Cleanser Normal Saline Discharge Instruction: Cleanse the wound with Normal Saline prior to applying a clean dressing using gauze sponges, not tissue or cotton balls. Wound Cleanser Discharge Instruction: Cleanse the wound with wound cleanser prior to applying a clean dressing using gauze sponges, not tissue or cotton balls. Peri-Wound Care Topical Primary Dressing Promogran Prisma Matrix, 4.34 (sq in) (silver collagen) Discharge Instruction: Moisten collagen with saline or hydrogel Secondary Dressing Woven Gauze Sponge, Non-Sterile 4x4 in Discharge Instruction: Apply over primary dressing as directed. Zetuvit Plus 4x8 in Discharge Instruction: Apply over primary dressing as directed. Secured With [employer], 4.5x3.1 (in/yd) Discharge Instruction: Secure with Kerlix as directed. Compression Wrap Compression Stockings Add-Ons total contact cast Electronic Signature(s) Signed: 09/13/2021 11:51:43 AM By: Kalman Shan DO Signed: 09/13/2021 4:43:35 PM By: Lorrin Jackson Entered By: Kalman Shan on 09/13/2021 11:46:08 -------------------------------------------------------------------------------- Multi-Disciplinary Care Plan Details Patient Name: Date of Service: [name], [place] 09/13/2021 9:45 A M Medical Record Number: [id number] Patient Account Number: [id number] Date of Birth/Sex: Treating RN: 05/12/1976 (46 y.o. [name] Primary Care Jawann Urbani: [name] Other Clinician: Referring Velita Quirk: Treating Kimiah Hibner/Extender: [name] in Treatment: 48 Multidisciplinary Care Plan reviewed with physician Active Inactive Abuse / Safety / Falls / Self Care Management Nursing Diagnoses: Potential for falls Goals: Patient/caregiver will verbalize/demonstrate measures taken to prevent injury and/or falls Date Initiated: 07/15/2020 Target Resolution Date: 10/11/2021 Goal  Status: Active Interventions: Assess fall risk on admission and as needed Assess impairment of mobility on admission and as needed per policy Notes: Wound/Skin Impairment Nursing Diagnoses: Impaired tissue integrity Knowledge deficit related to ulceration/compromised skin integrity Goals: Patient/caregiver will verbalize understanding of skin care regimen Date Initiated: 07/15/2020 Target Resolution Date: 10/11/2021 Goal Status: Active Ulcer/skin breakdown will have a volume reduction of 30% by week 4 Date Initiated: 07/15/2020 Date Inactivated: 08/20/2020 Target Resolution Date: 09/03/2020 Goal Status: Unmet Unmet Reason: no major changes. Ulcer/skin breakdown will heal within 14 weeks Date Initiated: 12/04/2020 Date Inactivated: 12/10/2020 Target Resolution Date: 12/10/2020 Unmet Reason: wounds still open at 14 Goal Status: Unmet weeks and today 21 weeks. Interventions: Assess patient/caregiver ability to obtain necessary supplies Assess patient/caregiver ability to perform ulcer/skin care regimen upon admission and as needed Assess ulceration(s) every visit Provide education on ulcer and skin care Treatment Activities: Skin care regimen initiated : 07/15/2020 Topical wound management initiated : 07/15/2020 Notes: Electronic Signature(s) Signed: 09/13/2021 4:37:55 PM By: Baruch Gouty RN, BSN Entered By: Baruch Gouty on 09/13/2021 11:35:30 -------------------------------------------------------------------------------- Pain Assessment Details Patient Name: Date of Service:  [place], [place] 09/13/2021 9:45 A M Medical Record Number: [id number] Patient Account Number: [id number] Date of Birth/Sex: Treating RN: 1976/06/11 (46 y.o. [name] Primary Care Dylen Mcelhannon: [name] Other Clinician: Referring Margrit Minner: Treating Arnold Depinto/Extender: [name] in Treatment: 72 Active Problems Location of Pain Severity and Description of Pain Patient  Has Paino Yes Site Locations Pain Location: Generalized Pain, Pain in Ulcers With Dressing Change: No Duration of the Pain. Constant / Intermittento Intermittent Rate the pain. Current Pain Level: 4 Worst Pain Level: 8 Least Pain Level: 0 Character of Pain Describe the Pain: Aching Pain Management and Medication Current Pain Management: Medication: Yes Is the Current Pain Management Adequate: Adequate Rest: Yes How does your wound impact your activities of daily livingo Sleep: No Bathing: No Appetite: No Relationship With Others: No Bladder Continence: No Emotions: No Bowel Continence: No Hobbies: Yes Toileting: No Dressing: No Electronic Signature(s) Signed: 09/13/2021 4:37:55 PM By: Baruch Gouty RN, BSN Entered By: Baruch Gouty on 09/13/2021 09:41:17 -------------------------------------------------------------------------------- Patient/Caregiver Education Details Patient Name: Date of Service: Ciro Backer 2/6/2023andnbsp9:45 [place] Record Number: [id number] Patient Account Number: [id number] Date of Birth/Gender: Treating RN: [birth year] (46 y.o. [name] Primary Care Physician: [name] Other Clinician: Referring Physician: Treating Physician/Extender: [name] in Treatment: 10 Education Assessment Education Provided To: Patient Education Topics Provided Offloading: Methods: Explain/Verbal Responses: Reinforcements needed, State content correctly Wound/Skin Impairment: Methods: Explain/Verbal Responses: Reinforcements needed, State content correctly Electronic Signature(s) Signed: 09/13/2021 4:37:55 PM By: Baruch Gouty RN, BSN Entered By: Baruch Gouty on 09/13/2021 11:36:00 -------------------------------------------------------------------------------- Wound Assessment Details Patient Name: Date of Service: [name], [place] 09/13/2021 9:45 A M Medical Record Number: [id number] Patient  Account Number: [id number] Date of Birth/Sex: Treating RN: May 14, 1976 (46 y.o. [name] Primary Care Artia Singley: [name] Other Clinician: Referring Aydan Phoenix: Treating Nieshia Larmon/Extender: [name] in Treatment: 60 Wound Status Wound Number: 1 Primary Etiology: Pressure Ulcer Wound Location: Right Calcaneus Wound Status: Open Wounding Event: Pressure Injury Date Acquired: 10/07/2019 Weeks Of Treatment: 60 Clustered Wound: No Wound Measurements Length: (cm) 3 Width: (cm) 1 Depth: (cm) 0.5 Area: (cm) 2.356 Volume: (cm) 1.178 % Reduction in Area: 92.5% % Reduction in Volume: 96.3% Wound Description Classification: Category/Stage III Exudate Amount: Medium Exudate Type: Serosanguineous Exudate Color: red, brown Treatment Notes Wound #1 (Calcaneus) Wound Laterality: Right Cleanser Normal Saline Discharge Instruction: Cleanse the wound with Normal Saline prior to applying a clean dressing using gauze sponges, not tissue or cotton balls. Wound Cleanser Discharge Instruction: Cleanse the wound with wound cleanser prior to applying a clean dressing using gauze sponges, not tissue or cotton balls. Peri-Wound Care Topical Primary Dressing Promogran Prisma Matrix, 4.34 (sq in) (silver collagen) Discharge Instruction: Moisten collagen with saline or hydrogel Secondary Dressing Woven Gauze Sponge, Non-Sterile 4x4 in Discharge Instruction: Apply over primary dressing as directed. Zetuvit Plus 4x8 in Discharge Instruction: Apply over primary dressing as directed. Secured With Elastic Bandage 4 inch (ACE bandage) Discharge Instruction: Secure with ACE bandage as directed. Kerlix Roll Sterile, 4.5x3.1 (in/yd) Discharge Instruction: Secure with Kerlix as directed. Compression Wrap Compression Stockings Add-Ons Electronic Signature(s) Signed: 09/13/2021 4:37:55 PM By: Baruch Gouty RN, BSN Entered By: Baruch Gouty on 09/13/2021  10:30:46 -------------------------------------------------------------------------------- Wound Assessment Details Patient Name: Date of Service: [name], [place] 09/13/2021 9:45 A M Medical Record Number: [id number] Patient Account Number: [id number] Date of Birth/Sex: Treating RN: 1976-03-04 (46 y.o. [name]  Primary Care Sasha Rueth: [name] Other Clinician: Referring Lyan Holck: Treating Winifred Bodiford/Extender: [name] in Treatment: 60 Wound Status Wound Number: 2 Primary Etiology: Pressure Ulcer Wound Location: Left Calcaneus Wound Status: Open Wounding Event: Pressure Injury Date Acquired: 10/07/2019 Weeks Of Treatment: 60 Clustered Wound: No Wound Measurements Length: (cm) 3 Width: (cm) 0.7 Depth: (cm) 0.5 Area: (cm) 1.649 Volume: (cm) 0.825 % Reduction in Area: 93.9% % Reduction in Volume: 97% Wound Description Classification: Category/Stage III Exudate Amount: Medium Exudate Type: Serosanguineous Exudate Color: red, brown Treatment Notes Wound #2 (Calcaneus) Wound Laterality: Left Cleanser Normal Saline Discharge Instruction: Cleanse the wound with Normal Wound Cleanser Discharge Instruction: Cleanse the wound with wound Saline prior to applying a clean dressing using gauze sponges, not tissue or cotton balls. cleanser prior to applying a clean dressing using gauze sponges, not tissue or cotton balls. Peri-Wound Care Topical Primary Dressing Promogran Prisma Matrix, 4.34 (sq in) (silver collagen) Discharge Instruction: Moisten collagen with saline or hydrogel Secondary Dressing Woven Gauze Sponge, Non-Sterile 4x4 in Discharge Instruction: Apply over primary dressing as directed. Zetuvit Plus 4x8 in Discharge Instruction: Apply over primary dressing as directed. Secured With [employer], 4.5x3.1 (in/yd) Discharge Instruction: Secure with Kerlix as directed. Compression Wrap Compression Stockings Add-Ons total  contact cast Electronic Signature(s) Signed: 09/13/2021 4:37:55 PM By: Baruch Gouty RN, BSN Entered By: Baruch Gouty on 09/13/2021 10:30:46 -------------------------------------------------------------------------------- Vitals Details Patient Name: Date of Service: [name], [place] 09/13/2021 9:45 A M Medical Record Number: [id number] Patient Account Number: [id number] Date of Birth/Sex: Treating RN: 02/15/1976 (46 y.o. [name] Primary Care Pegge Cumberledge: [name] Other Clinician: Referring Vipul Cafarelli: Treating Milos Milligan/Extender: [name] in Treatment: 60 Vital Signs Time Taken: 09:38 Temperature (F): 98.3 Height (in): 69 Pulse (bpm): 81 Source: Stated Respiratory Rate (breaths/min): 18 Weight (lbs): 280 Blood Pressure (mmHg): 149/87 Source: Stated Reference Range: 80 - 120 mg / dl Body Mass Index (BMI): 41.3 Electronic Signature(s) Signed: 09/13/2021 4:37:55 PM By: Baruch Gouty RN, BSN Entered By: Baruch Gouty on 09/13/2021 09:39:34

## 2021-09-13 NOTE — Telephone Encounter (Signed)
Mr [name] called for a refill on his hydrocodone. Per PMP he has not filled anything since 05/06/21. Dr Dagoberto Ligas sent over and Rx for his hydrocodone on 09/06/21.  I have left a message for [name] to check with the [facility] on [address].

## 2021-09-16 ENCOUNTER — Encounter (HOSPITAL_BASED_OUTPATIENT_CLINIC_OR_DEPARTMENT_OTHER): Payer: BC Managed Care – PPO | Admitting: Internal Medicine

## 2021-09-16 ENCOUNTER — Other Ambulatory Visit: Payer: Self-pay

## 2021-09-16 DIAGNOSIS — I10 Essential (primary) hypertension: Secondary | ICD-10-CM | POA: Diagnosis not present

## 2021-09-16 DIAGNOSIS — L97522 Non-pressure chronic ulcer of other part of left foot with fat layer exposed: Secondary | ICD-10-CM | POA: Diagnosis not present

## 2021-09-16 DIAGNOSIS — L89620 Pressure ulcer of left heel, unstageable: Secondary | ICD-10-CM | POA: Diagnosis not present

## 2021-09-16 DIAGNOSIS — L97512 Non-pressure chronic ulcer of other part of right foot with fat layer exposed: Secondary | ICD-10-CM | POA: Diagnosis not present

## 2021-09-16 DIAGNOSIS — Z8616 Personal history of COVID-19: Secondary | ICD-10-CM | POA: Diagnosis not present

## 2021-09-16 NOTE — Progress Notes (Signed)
ZACARI, STIFF ([id number]) Visit Report for 09/16/2021 HPI Details Patient Name: Date of Service: [name], [name] 09/16/2021 9:30 A M Medical Record Number: [id number] Patient Account Number: [id number] Date of Birth/Sex: Treating RN: 1976/04/04 (46 y.o. [name] Primary Care Provider: [name] Other Clinician: Referring Provider: Treating Provider/Extender: [name] in Treatment: 57 History of Present Illness HPI Description: Wounds are12/03/2020 upon evaluation today patient presents for initial inspection here in our clinic concerning issues he has been having with the bottoms of his feet bilaterally. He states these actually occurred as wounds when he was hospitalized for 5 months secondary to Covid. He was apparently with tilting bed where he was in an upright position quite frequently and apparently this occurred in some way shape or form during that time. Fortunately there is no sign of active infection at this time. No fevers, chills, nausea, vomiting, or diarrhea. With that being said he still has substantial wounds on the plantar aspects of his feet Theragen require quite a bit of work to get these to heal. He has been using Santyl currently though that is been problematic both in receiving the medication as well as actually paid for it as it is become quite expensive. Prior to the experience with Covid the patient really did not have any major medical problems other than hypertension he does have some mild generalized weakness following the Covid experience. 07/22/2020 on evaluation today patient appears to be doing okay in regard to his foot ulcers I feel like the wound beds are showing signs of better improvement that I do believe the Iodoflex is helping in this regard. With that being said he does have a lot of drainage currently and this is somewhat blue/green in nature which is consistent with Pseudomonas. I do think a culture today would be  appropriate for [place] to evaluate and see if that is indeed the case I would likely start him on antibiotic orally as well he is not allergic to Cipro knows of no issues he has had in the past 12/21; patient was admitted to the clinic earlier this month with bilateral presumed pressure ulcers on the bottom of his feet apparently related to excessive pressure from a tilt table arrangement in the intensive care unit. Patient relates this to being on ECMO but I am not really sure that is exactly related to that. I must say I have never seen anything like this. He has fairly extensive full-thickness wounds extending from his heel towards his midfoot mostly centered laterally. There is already been some healing distally. He does not appear to have an arterial issue. He has been using gentamicin to the wound surfaces with Iodoflex to help with ongoing debridement 1/6; this is a patient with pressure ulcers on the bottom of his feet related to excessive pressure from a standing position in the intensive care unit. He is complaining of a lot of pain in the right heel. He is not a diabetic. 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 We have been using Iodoflex to help prepare the surfaces of both wounds for an advanced treatment product. He is nonambulatory spending most of his time in a wheelchair I have asked him not to propel the wheelchair with his heels 1/13; in general his wounds look better not much surface area change we have been using Iodoflex as of last week. I did an x-ray of the right heel as the patient was complaining of pain.  I had some thoughts about a stress fracture perhaps Achilles tendon problems however what it showed was erosive changes along the inferior aspect of the calcaneus he now has a MRI booked for 1/20. 1/20; in general his wounds continue to be better. Some improvement in the large narrow areas proximally in his foot. He is still complaining of pain in the  right heel and tenderness in certain areas of this wound. His MRI is tonight. I am not just looking for osteomyelitis that was brought up on the x-ray I am wondering about stress fractures, tendon ruptures etc. He has no such findings on the left. Also noteworthy is that the patient had critical illness neuropathy and some of the discomfort may be actual improvement in nerve function I am just not sure. These wounds were initially in the setting of severe critical illness related to COVID-19. He was put in a standing position. He may have also been on pressors at the point contributing to tissue ischemia. By his description at some point these wounds were grossly necrotic extending proximally up into the Achilles part of his heel. I do not know that I have ever really seen pictures of them like this although they may exist in epic We have ordered Tri layer Oasis. I am trying to stimulate some granulation in these areas. This is of course assuming the MRI is negative for infection 1/27; since the patient was last here he saw Dr. Juleen China of infectious disease. He is planned for vancomycin and ceftriaxone. Prior operative culture grew MSSA. Also ordered baseline lab work. He also ordered arterial studies although the ABIs in our clinic were normal as well as his clinical exam these were normal I do not think he needs to see vascular surgery. His ABIs at the [facility] were 1.22 in the right triphasic waveforms with a normal TBI of 1.15 on the left ABI of 1.22 with triphasic waveforms and a normal TBI of 1.08. Finally he saw Dr. Amalia Hailey who will follow him in for 2 months. At this point I do not think he felt that he needed a procedure on the right calcaneal bone. Dr. Juleen China is elected for broad-spectrum antibiotic The patient is still having pain in the right heel. He walks with a walker 2/3; wounds are generally smaller. He is tolerating his IV antibiotics. I believe this is vancomycin and ceftriaxone. We are  still waiting for Oasis burn in terms of his out-of-pocket max which he should be meeting soon given the IV antibiotics, MRIs etc. I have asked him to check in on this. We are using silver collagen in the meantime the wounds look better 2/10; tolerating IV vancomycin and Rocephin. We are waiting to apply for Oasis. Although I am not really sure where he is in his out-of-pocket max. 2/17 started the first application of Oasis trilayer. Still on antibiotics. The wounds have generally look better. The area on the left has a little more surface slough requiring debridement 7/90; second application of Oasis trilayer. The wound surface granulation is generally look better. The area on the left with undermining laterally I think is come in a bit. 10/08/2020 upon evaluation today patient is here today for [employer] application #3. Fortunately he seems to be doing extremely well with regard to this and we are seeing a lot of new epithelial growth which is great news. Fortunately there is no signs of active infection at this time. 10/16/2020 upon evaluation today patient appears to be doing well with  regard to his foot ulcers. Do believe the Oasis has been of benefit for him. I do not see any signs of infection right now which is great news and I think that he has a lot of new epithelial growth which is great to see as well. The patient is very pleased to hear all of this. I do think we can proceed with the Oasis trilayer #4 today. 3/18; not as much improvement in these areas on his heels that I was hoping. I did reapply trilateral Oasis today the tissue looks healthier but not as much fill in as I was hoping. 3/25; better looking today I think this is come in a bit the tissue looks healthier. Triple layer Oasis reapplied #6 4/1; somewhat better looking definitely better looking surface not as much change in surface area as I was hoping. He may be spending more time [place] on days then he needs to although  he does have heel offloading boots. Triple layer Oasis reapplied #7 4/7; unfortunately apparently [facility] will not approve any further Oasis which is unfortunate since the patient did respond nicely both in terms of the condition of the wound bed as well as surface area. There is still some drainage coming from the wound but not a lot there does not appear to be any infection 4/15; we have been using Hydrofera Blue. He continues to have nice rims of epithelialization on the right greater than the left. The left the epithelialization is coming from the tip of his heel. There is moderate drainage. In this that concerns me about a total contact cast. There is no evidence of infection 4/29; patient has been using Hydrofera Blue with dressing changes. He has no complaints or issues today. 5/5; using Hydrofera Blue. I actually think that he looks marginally better than the last time I saw this 3 weeks ago. There are rims of epithelialization on the left thumb coming from the medial side on the right. Using Hydrofera Blue 5/12; using Hydrofera Blue. These continue to make improvements in surface area. His drainage was not listed as severe I therefore went ahead and put a cast on the left foot. Right foot we will continue to dress his previous 5/16; back for first total contact cast change. He did not tolerate this particularly well cast injury on the anterior tibia among other issues. Difficulty sleeping. I talked him about this in some detail and afterwards is elected to continue. I told him I would like to have a cast on for 3 weeks to see if this is going to help at all. I think he agreed 5/19; I think the wound is better. There is no tunneling towards his midfoot. The undermining medially also looks better. He has a rim of new skin distally. I think we are making progress here. The area on the left also continues to look somewhat better to me using Hydrofera Blue. He has a list of  complaints about the cast but none of them seem serious 5/26; patient presents for 1 week follow-up. He has been using a total contact cast and tolerating this well. Hydrofera Blue is the main dressing used. He denies signs of infection. 6/2 Hydrofera Blue total contact cast on the left. These were large ulcers that formed in intensive care unit where the patient was recovering from [place]. May have had something to do with being ventilated in an upright positiono Pressors etc. We have been able to get the areas down considerably and  a viable surface. There is some epithelialization in both sides. Note made of drainage 6/9; changed to [facility] last time because of drainage. He arrives with better looking surfaces and dimensions on the left than the right. Paradoxically the right actually probes more towards his midfoot the left is largely close down but both of these look improved. Using a total contact cast on the left 6/16; complex wounds on his bilateral plantar heels which were initially pressure injury from a stay in the ICU with COVID. We have been using silver alginate most recently. His dimensions of come in quite dramatically however not recently. We have been putting the left foot in a total contact cast 6/23; complex wounds on the bilateral plantar heels. I been putting the left in the cast paradoxically the area on the right is the one that is going towards closure at a faster rate. Quite a bit of drainage on the left. The patient went to see Dr. Amalia Hailey who said he was going to standby for skin grafts. I had actually considered sending him for skin grafts however he would be mandatorily off his feet for a period of weeks to months. I am thinking that the area on the right is going to close on its own the area on the left has been more stubborn even though we have him in a total contact cast 6/30; took him out of a total contact cast last week is the right heel seem to be making better  progress than the left where I was placing the cast. We are using silver alginate. Both wounds are smaller right greater than left 7/12; both wounds look as though they are making some progress. We are using silver alginate. Heel offloading boots 7/26; very gradual progress especially on the right. Using silver alginate. He is wearing heel offloading boots 8/18; he continues to close these wounds down very gradually. Using silver alginate. The problem polymen being definitive about this is areas of what appears to be callus around the margins. This is not a 100% of the area but certainly sizable especially on the right 9/1; bilateral plantar feet wounds secondary to prolonged pressure while being ventilated for COVID-19 in an upright position. Essentially pressure ulcers on the bottom of his feet. He is made substantial progress using silver alginate. 9/14; bilateral plantar feet wounds secondary to prolonged pressure. Making progress using silver alginate. 9/29 bilateral plantar feet wounds secondary to prolonged pressure. I changed him to Iodoflex last week. MolecuLight showing reddened blush fluorescence 10/11; patient presents for follow-up. He has no issues or complaints today. He denies signs of infection. He continues to use Iodoflex and antibiotic ointment to the wound beds. 10/27; 2-week follow-up. No evidence of infection. He has callus and thick dry skin around the wound margins we have been using Iodoflex and Bactroban which was in response to a moderate left MolecuLight reddish blush fluorescence. 11/10; 2-week follow-up. Wound margins again have thick callus however the measurements of the actual wound sites are a lot smaller. Everything looks reasonably healthy here. We have been using Iodoflex He was approved for prime matrix but I have elected to delay this given the improvement in the surface area. Hopefully I will not regret that decision as were getting close to the end of the  year in terms of insurance payment 12/8; 2-week follow-up. Wounds are generally smaller in size. These were initially substantial wounds extending into the forefoot all the way into the heel on the bilateral plantar  feet. They are now both located on the plantar heel distal aspect both of these have a lot of callus around the wounds I used a #5 curette to remove this on the right and the left also some subcutaneous debris to try and get the wound edges were using Iodoflex. He has heel offloading shoe 12/22; 2-week follow-up. Not really much improvement. He has thick callus around the outer edges of both wounds. I remove this there is some nonviable subcutaneous tissue as well. We have been using Iodoflex. Her intake nurse and myself spontaneously thought of a total contact cast I went back in May. At that time we really were not seeing much of an improvement with a cast although the wound was in a much different situation I would like to retry this in 2 weeks and I discussed this with the patient 08/12/2021; the patient has had some improvement with the Iodoflex. The the area on the left heel plantar more improved than the right. I had to put him in a total contact cast on the left although I decided to put that off for 2 weeks. I am going to change his primary dressing to silver collagen. I think in both areas he has had some improvement most of the healing seems to be more proximal in the heel. The wounds are in the mid aspect. A lot of thick callus on the right heel however. 1/19; we are using silver collagen on both plantar heel areas. He has had some improvement today. The left did not require any debridement. He still had some eschar on the right that was debrided but both seem to have contracted. I did not put it total contact cast on him today 2/2 we have been using silver collagen. The area on the right plantar heel has areas that appear to be epithelialized interspersed with dry flaking callus  and dry skin. I removed this. This really looks better than on the other side. On the left still a large area with raised edges and debris on the surface. 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 He has no issues or complaints today. 2/9; not much change in the left foot wound with 1 week of a cast we are using silver collagen. Silver collagen on the right side. The right side has been the better wound surface. We will reapply the total contact cast on the left Electronic Signature(s) Signed: 09/16/2021 5:29:10 PM By: Linton Ham MD Entered By: Linton Ham on 09/16/2021 10:31:26 -------------------------------------------------------------------------------- Physical Exam Details Patient Name: Date of Service: [name], [name] 09/16/2021 9:30 A M Medical Record Number: [id number] Patient Account Number: [id number] Date of Birth/Sex: Treating RN: Jan 01, 1976 (46 y.o. [name] Primary Care Provider: [name] Other Clinician: Referring Provider: Treating Provider/Extender: [name] in Treatment: 61 Notes Wound exam Left heel still with some depth here. Tissue does not look to too bad. There are raised edges and some callus I did not debride this today. No major changes this week On the right things are actually better everything is more superficial here. Still using silver collagen as well Electronic Signature(s) Signed: 09/16/2021 5:29:10 PM By: Linton Ham MD Entered By: Linton Ham on 09/16/2021 10:32:06 -------------------------------------------------------------------------------- Physician Orders Details Patient Name: Date of Service: [name], [name] E. 09/16/2021 9:30 A M Medical Record Number: [id number]  Patient Account Number: [id number] Date of Birth/Sex: Treating RN: September 19, 1975 (46 y.o. [name] Primary Care Provider: [name] Other Clinician: Referring Provider: Treating Provider/Extender: [name] in Treatment: 31 Verbal / Phone Orders: No Diagnosis Coding ICD-10 Coding Code Description [phone] Non-pressure chronic ulcer of other part of right foot with fat layer exposed L97.522 Non-pressure chronic ulcer of other part of left foot with fat layer exposed Follow-up Appointments ppointment in 1 week. - Dr [name] Thursday Return A Bathing/ Shower/ Hygiene May shower with protection but do not get wound dressing(s) wet. - Cover with cast protector (can purchase cast protector at [facility] or [facility] ) Edema Control - Lymphedema / SCD / Other Bilateral Lower Extremities Elevate legs to the level of the heart or above for 30 minutes daily and/or when sitting, a frequency of: - throughout the day Avoid standing for long periods of time. Moisturize legs daily. - right leg and foot every night. Off-Loading Total Contact Cast to Left Lower Extremity - TCC-EZ cast to Left Lower Leg Wedge shoe to: - Glophed Shoe to right foot Other: - keep pressure off of the bottom of your feet Additional Orders / Instructions Follow Nutritious Diet Wound Treatment Wound #1 - Calcaneus Wound Laterality: Right Cleanser: Normal Saline (Generic) Every Other Day/30 Days Discharge Instructions: Cleanse the wound with Normal Saline prior to applying a clean dressing using gauze sponges, not tissue or cotton balls. Cleanser: Wound Cleanser Every Other Day/30 Days Discharge Instructions: Cleanse the wound with wound cleanser prior to applying a clean dressing using gauze sponges, not tissue or cotton balls. Prim Dressing: Promogran Prisma Matrix, 4.34 (sq in) (silver collagen) (Generic) Every Other Day/30 Days ary Discharge Instructions: Moisten collagen with saline or hydrogel Secured With: Elastic Bandage 4 inch (ACE bandage) (Generic) Every Other Day/30  Days Discharge Instructions: Secure with ACE bandage as directed. Secured With: [employer], 4.5x3.1 (in/yd) (Generic) Every Other Day/30 Days Discharge Instructions: Secure with Kerlix as directed. Wound #2 - Calcaneus Wound Laterality: Left Cleanser: Normal Saline (Generic) Every Other Day/30 Days Discharge Instructions: Cleanse the wound with Normal Saline prior to applying a clean dressing using gauze sponges, not tissue or cotton balls. Cleanser: Wound Cleanser Every Other Day/30 Days Discharge Instructions: Cleanse the wound with wound cleanser prior to applying a clean dressing using gauze sponges, not tissue or cotton balls. Prim Dressing: Promogran Prisma Matrix, 4.34 (sq in) (silver collagen) (Generic) Every Other Day/30 Days ary Discharge Instructions: Moisten collagen with saline or hydrogel Secured With: Elastic Bandage 4 inch (ACE bandage) (Generic) Every Other Day/30 Days Discharge Instructions: Secure with ACE bandage as directed. Secured With: [employer], 4.5x3.1 (in/yd) (Generic) Every Other Day/30 Days Discharge Instructions: Secure with Kerlix as directed. Electronic Signature(s) Signed: 09/16/2021 12:08:59 PM By: [name] Signed: 09/16/2021 5:29:10 PM By: Linton Ham MD Entered By: [name] on 09/16/2021 10:17:21 -------------------------------------------------------------------------------- Problem List Details Patient Name: Date of Service: [name], [name] 09/16/2021 9:30 A M Medical Record Number: [id number] Patient Account Number: [id number] Date of Birth/Sex: Treating RN: [birth year] (46 y.o. [name] Primary Care Provider: [name] Other Clinician: Referring Provider: Treating Provider/Extender: [name] in Treatment: 61 Active Problems ICD-10 Encounter Code Description Active Date MDM Diagnosis [phone] Non-pressure chronic ulcer of other part of right foot with fat layer exposed  09/03/2020 No Yes L97.522 Non-pressure chronic ulcer of other part of left foot with fat layer exposed 09/03/2020 No Yes Inactive Problems ICD-10  Code Description Active Date Inactive Date L89.893 Pressure ulcer of other site, stage 3 07/15/2020 07/15/2020 M62.81 Muscle weakness (generalized) 07/15/2020 07/15/2020 I10 Essential (primary) hypertension 07/15/2020 07/15/2020 M86.171 Other acute osteomyelitis, right ankle and foot 09/03/2020 09/03/2020 Resolved Problems Electronic Signature(s) Signed: 09/16/2021 5:29:10 PM By: Linton Ham MD Entered By: Linton Ham on 09/16/2021 10:30:12 -------------------------------------------------------------------------------- Progress Note Details Patient Name: Date of Service: [name], [name] 09/16/2021 9:30 A M Medical Record Number: [id number] Patient Account Number: [id number] Date of Birth/Sex: Treating RN: [birth year] (46 y.o. [name] Primary Care Provider: [name] Other Clinician: Referring Provider: Treating Provider/Extender: [name] in Treatment: 61 Subjective History of Present Illness (HPI) Wounds are12/03/2020 upon evaluation today patient presents for initial inspection here in our clinic concerning issues he has been having with the bottoms of his feet bilaterally. He states these actually occurred as wounds when he was hospitalized for 5 months secondary to Covid. He was apparently with tilting bed where he was in an upright position quite frequently and apparently this occurred in some way shape or form during that time. Fortunately there is no sign of active infection at this time. No fevers, chills, nausea, vomiting, or diarrhea. With that being said he still has substantial wounds on the plantar aspects of his feet Theragen require quite a bit of work to get these to heal. He has been using Santyl currently though that is been problematic both in receiving the medication as well as actually  paid for it as it is become quite expensive. Prior to the experience with Covid the patient really did not have any major medical problems other than hypertension he does have some mild generalized weakness following the Covid experience. 07/22/2020 on evaluation today patient appears to be doing okay in regard to his foot ulcers I feel like the wound beds are showing signs of better improvement that I do believe the Iodoflex is helping in this regard. With that being said he does have a lot of drainage currently and this is somewhat blue/green in nature which is consistent with Pseudomonas. I do think a culture today would be appropriate for [place] to evaluate and see if that is indeed the case I would likely start him on antibiotic orally as well he is not allergic to Cipro knows of no issues he has had in the past 12/21; patient was admitted to the clinic earlier this month with bilateral presumed pressure ulcers on the bottom of his feet apparently related to excessive pressure from a tilt table arrangement in the intensive care unit. Patient relates this to being on ECMO but I am not really sure that is exactly related to that. I must say I have never seen anything like this. He has fairly extensive full-thickness wounds extending from his heel towards his midfoot mostly centered laterally. There is already been some healing distally. He does not appear to have an arterial issue. He has been using gentamicin to the wound surfaces with Iodoflex to help with ongoing debridement 1/6; this is a patient with pressure ulcers on the bottom of his feet related to excessive pressure from a standing position in the intensive care unit. He is complaining of a lot of pain in the right heel. He is not a diabetic. 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 We have been using Iodoflex to help prepare the surfaces of both wounds for an advanced treatment product. He is nonambulatory spending  most of his  time in a wheelchair I have asked him not to propel the wheelchair with his heels 1/13; in general his wounds look better not much surface area change we have been using Iodoflex as of last week. I did an x-ray of the right heel as the patient was complaining of pain. I had some thoughts about a stress fracture perhaps Achilles tendon problems however what it showed was erosive changes along the inferior aspect of the calcaneus he now has a MRI booked for 1/20. 1/20; in general his wounds continue to be better. Some improvement in the large narrow areas proximally in his foot. He is still complaining of pain in the right heel and tenderness in certain areas of this wound. His MRI is tonight. I am not just looking for osteomyelitis that was brought up on the x-ray I am wondering about stress fractures, tendon ruptures etc. He has no such findings on the left. Also noteworthy is that the patient had critical illness neuropathy and some of the discomfort may be actual improvement in nerve function I am just not sure. These wounds were initially in the setting of severe critical illness related to COVID-19. He was put in a standing position. He may have also been on pressors at the point contributing to tissue ischemia. By his description at some point these wounds were grossly necrotic extending proximally up into the Achilles part of his heel. I do not know that I have ever really seen pictures of them like this although they may exist in epic We have ordered Tri layer Oasis. I am trying to stimulate some granulation in these areas. This is of course assuming the MRI is negative for infection 1/27; since the patient was last here he saw Dr. Juleen China of infectious disease. He is planned for vancomycin and ceftriaxone. Prior operative culture grew MSSA. Also ordered baseline lab work. He also ordered arterial studies although the ABIs in our clinic were normal as well as his clinical exam these  were normal I do not think he needs to see vascular surgery. His ABIs at the [facility] were 1.22 in the right triphasic waveforms with a normal TBI of 1.15 on the left ABI of 1.22 with triphasic waveforms and a normal TBI of 1.08. Finally he saw Dr. Amalia Hailey who will follow him in for 2 months. At this point I do not think he felt that he needed a procedure on the right calcaneal bone. Dr. Juleen China is elected for broad-spectrum antibiotic The patient is still having pain in the right heel. He walks with a walker 2/3; wounds are generally smaller. He is tolerating his IV antibiotics. I believe this is vancomycin and ceftriaxone. We are still waiting for Oasis burn in terms of his out-of-pocket max which he should be meeting soon given the IV antibiotics, MRIs etc. I have asked him to check in on this. We are using silver collagen in the meantime the wounds look better 2/10; tolerating IV vancomycin and Rocephin. We are waiting to apply for Oasis. Although I am not really sure where he is in his out-of-pocket max. 2/17 started the first application of Oasis trilayer. Still on antibiotics. The wounds have generally look better. The area on the left has a little more surface slough requiring debridement 1/61; second application of Oasis trilayer. The wound surface granulation is generally look better. The area on the left with undermining laterally I think is come in a bit. 10/08/2020 upon evaluation today patient is here today for [facility]  trilayer application #3. Fortunately he seems to be doing extremely well with regard to this and we are seeing a lot of new epithelial growth which is great news. Fortunately there is no signs of active infection at this time. 10/16/2020 upon evaluation today patient appears to be doing well with regard to his foot ulcers. Do believe the Oasis has been of benefit for him. I do not see any signs of infection right now which is great news and I think that he has a lot of new  epithelial growth which is great to see as well. The patient is very pleased to hear all of this. I do think we can proceed with the Oasis trilayer #4 today. 3/18; not as much improvement in these areas on his heels that I was hoping. I did reapply trilateral Oasis today the tissue looks healthier but not as much fill in as I was hoping. 3/25; better looking today I think this is come in a bit the tissue looks healthier. Triple layer Oasis reapplied #6 4/1; somewhat better looking definitely better looking surface not as much change in surface area as I was hoping. He may be spending more time [place] on days then he needs to although he does have heel offloading boots. Triple layer Oasis reapplied #7 4/7; unfortunately apparently [facility] will not approve any further Oasis which is unfortunate since the patient did respond nicely both in terms of the condition of the wound bed as well as surface area. There is still some drainage coming from the wound but not a lot there does not appear to be any infection 4/15; we have been using Hydrofera Blue. He continues to have nice rims of epithelialization on the right greater than the left. The left the epithelialization is coming from the tip of his heel. There is moderate drainage. In this that concerns me about a total contact cast. There is no evidence of infection 4/29; patient has been using Hydrofera Blue with dressing changes. He has no complaints or issues today. 5/5; using Hydrofera Blue. I actually think that he looks marginally better than the last time I saw this 3 weeks ago. There are rims of epithelialization on the left thumb coming from the medial side on the right. Using Hydrofera Blue 5/12; using Hydrofera Blue. These continue to make improvements in surface area. His drainage was not listed as severe I therefore went ahead and put a cast on the left foot. Right foot we will continue to dress his previous 5/16; back for first  total contact cast change. He did not tolerate this particularly well cast injury on the anterior tibia among other issues. Difficulty sleeping. I talked him about this in some detail and afterwards is elected to continue. I told him I would like to have a cast on for 3 weeks to see if this is going to help at all. I think he agreed 5/19; I think the wound is better. There is no tunneling towards his midfoot. The undermining medially also looks better. He has a rim of new skin distally. I think we are making progress here. The area on the left also continues to look somewhat better to me using Hydrofera Blue. He has a list of complaints about the cast but none of them seem serious 5/26; patient presents for 1 week follow-up. He has been using a total contact cast and tolerating this well. Hydrofera Blue is the main dressing used. He denies signs of infection. 6/2  Hydrofera Blue total contact cast on the left. These were large ulcers that formed in intensive care unit where the patient was recovering from [place]. May have had something to do with being ventilated in an upright positiono Pressors etc. We have been able to get the areas down considerably and a viable surface. There is some epithelialization in both sides. Note made of drainage 6/9; changed to [facility] last time because of drainage. He arrives with better looking surfaces and dimensions on the left than the right. Paradoxically the right actually probes more towards his midfoot the left is largely close down but both of these look improved. Using a total contact cast on the left 6/16; complex wounds on his bilateral plantar heels which were initially pressure injury from a stay in the ICU with COVID. We have been using silver alginate most recently. His dimensions of come in quite dramatically however not recently. We have been putting the left foot in a total contact cast 6/23; complex wounds on the bilateral plantar heels. I been  putting the left in the cast paradoxically the area on the right is the one that is going towards closure at a faster rate. Quite a bit of drainage on the left. The patient went to see Dr. Amalia Hailey who said he was going to standby for skin grafts. I had actually considered sending him for skin grafts however he would be mandatorily off his feet for a period of weeks to months. I am thinking that the area on the right is going to close on its own the area on the left has been more stubborn even though we have him in a total contact cast 6/30; took him out of a total contact cast last week is the right heel seem to be making better progress than the left where I was placing the cast. We are using silver alginate. Both wounds are smaller right greater than left 7/12; both wounds look as though they are making some progress. We are using silver alginate. Heel offloading boots 7/26; very gradual progress especially on the right. Using silver alginate. He is wearing heel offloading boots 8/18; he continues to close these wounds down very gradually. Using silver alginate. The problem polymen being definitive about this is areas of what appears to be callus around the margins. This is not a 100% of the area but certainly sizable especially on the right 9/1; bilateral plantar feet wounds secondary to prolonged pressure while being ventilated for COVID-19 in an upright position. Essentially pressure ulcers on the bottom of his feet. He is made substantial progress using silver alginate. 9/14; bilateral plantar feet wounds secondary to prolonged pressure. Making progress using silver alginate. 9/29 bilateral plantar feet wounds secondary to prolonged pressure. I changed him to Iodoflex last week. MolecuLight showing reddened blush fluorescence 10/11; patient presents for follow-up. He has no issues or complaints today. He denies signs of infection. He continues to use Iodoflex and antibiotic ointment to the wound  beds. 10/27; 2-week follow-up. No evidence of infection. He has callus and thick dry skin around the wound margins we have been using Iodoflex and Bactroban which was in response to a moderate left MolecuLight reddish blush fluorescence. 11/10; 2-week follow-up. Wound margins again have thick callus however the measurements of the actual wound sites are a lot smaller. Everything looks reasonably healthy here. We have been using Iodoflex He was approved for prime matrix but I have elected to delay this given the improvement in the surface  area. Hopefully I will not regret that decision as were getting close to the end of the year in terms of insurance payment 12/8; 2-week follow-up. Wounds are generally smaller in size. These were initially substantial wounds extending into the forefoot all the way into the heel on the bilateral plantar feet. They are now both located on the plantar heel distal aspect both of these have a lot of callus around the wounds I used a #5 curette to remove this on the right and the left also some subcutaneous debris to try and get the wound edges were using Iodoflex. He has heel offloading shoe 12/22; 2-week follow-up. Not really much improvement. He has thick callus around the outer edges of both wounds. I remove this there is some nonviable subcutaneous tissue as well. We have been using Iodoflex. Her intake nurse and myself spontaneously thought of a total contact cast I went back in May. At that time we really were not seeing much of an improvement with a cast although the wound was in a much different situation I would like to retry this in 2 weeks and I discussed this with the patient 08/12/2021; the patient has had some improvement with the Iodoflex. The the area on the left heel plantar more improved than the right. I had to put him in a total contact cast on the left although I decided to put that off for 2 weeks. I am going to change his primary dressing to silver  collagen. I think in both areas he has had some improvement most of the healing seems to be more proximal in the heel. The wounds are in the mid aspect. A lot of thick callus on the right heel however. 1/19; we are using silver collagen on both plantar heel areas. He has had some improvement today. The left did not require any debridement. He still had some eschar on the right that was debrided but both seem to have contracted. I did not put it total contact cast on him today 2/2 we have been using silver collagen. The area on the right plantar heel has areas that appear to be epithelialized interspersed with dry flaking callus and dry skin. I removed this. This really looks better than on the other side. On the left still a large area with raised edges and debris on the surface. 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 He has no issues or complaints today. 2/9; not much change in the left foot wound with 1 week of a cast we are using silver collagen. Silver collagen on the right side. The right side has been the better wound surface. We will reapply the total contact cast on the left Objective Constitutional Vitals Time Taken: 9:32 AM, Height: 69 in, Weight: 280 lbs, BMI: 41.3, Respiratory Rate: 18 breaths/min. Integumentary (Hair, Skin) Wound #1 status is Open. Original cause of wound was Pressure Injury. The date acquired was: 10/07/2019. The wound has been in treatment 61 weeks. The wound is located on the Right Calcaneus. The wound measures 2.3cm length x 0.5cm width x 0.5cm depth; 0.903cm^2 area and 0.452cm^3 volume. There is Fat Layer (Subcutaneous Tissue) exposed. There is no tunneling or undermining noted. There is a medium amount of serosanguineous drainage noted. There is small (1-33%) pink granulation within the wound bed. There is a large (67-100%) amount  of necrotic tissue  within the wound bed including Adherent Slough. Wound #2 status is Open. Original cause of wound was Pressure Injury. The date acquired was: 10/07/2019. The wound has been in treatment 61 weeks. The wound is located on the Left Calcaneus. The wound measures 2.5cm length x 0.6cm width x 0.4cm depth; 1.178cm^2 area and 0.471cm^3 volume. There is Fat Layer (Subcutaneous Tissue) exposed. There is no tunneling or undermining noted. There is a medium amount of serosanguineous drainage noted. There is small (1-33%) pink granulation within the wound bed. There is a large (67-100%) amount of necrotic tissue within the wound bed including Adherent Slough. 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 There was a T Contact Cast Procedure by Deno Etienne MD. Post procedure Diagnosis Wound #2: Same as Pre-Procedure Plan Follow-up Appointments: Return Appointment in 1 week. - Dr [name] Thursday Bathing/ Shower/ Hygiene: May shower with protection but do not get wound dressing(s) wet. - Cover with cast protector (can purchase cast protector at [facility] or [facility] ) Edema Control - Lymphedema / SCD / Other: Elevate legs to the level of the heart or above for 30 minutes daily and/or when sitting, a frequency of: - throughout the day Avoid standing for long periods of time. Moisturize legs daily. - right leg and foot every night. Off-Loading: T Contact Cast to Left Lower Extremity - TCC-EZ cast to Left Lower Leg otal Wedge shoe to: - Glophed Shoe to right foot Other: - keep pressure off of the bottom of your feet Additional Orders / Instructions: Follow Nutritious Diet WOUND #1: - Calcaneus Wound Laterality: Right Cleanser:  Normal Saline (Generic) Every Other Day/30 Days Discharge Instructions: Cleanse the wound with Normal Saline prior to applying a clean dressing using gauze sponges, not tissue or cotton balls. Cleanser: Wound Cleanser Every Other Day/30 Days Discharge Instructions: Cleanse the wound with wound cleanser prior to applying a clean dressing using gauze sponges, not tissue or cotton balls. Prim Dressing: Promogran Prisma Matrix, 4.34 (sq in) (silver collagen) (Generic) Every Other Day/30 Days ary Discharge Instructions: Moisten collagen with saline or hydrogel Secured With: Elastic Bandage 4 inch (ACE bandage) (Generic) Every Other Day/30 Days Discharge Instructions: Secure with ACE bandage as directed. Secured With: [employer], 4.5x3.1 (in/yd) (Generic) Every Other Day/30 Days Discharge Instructions: Secure with Kerlix as directed. WOUND #2: - Calcaneus Wound Laterality: Left Cleanser: Normal Saline (Generic) Every Other Day/30 Days Discharge Instructions: Cleanse the wound with Normal Saline prior to applying a clean dressing using gauze sponges, not tissue or cotton balls. Cleanser: Wound Cleanser Every Other Day/30 Days Discharge Instructions: Cleanse the wound with wound cleanser prior to applying a clean dressing using gauze sponges, not tissue or cotton balls. Prim Dressing: Promogran Prisma Matrix, 4.34 (sq in) (silver collagen) (Generic) Every Other Day/30 Days ary Discharge Instructions: Moisten collagen with saline or hydrogel Secured With: Elastic Bandage 4 inch (ACE bandage) (Generic) Every Other Day/30 Days Discharge Instructions: Secure with ACE bandage as directed. Secured With: [employer], 4.5x3.1 (in/yd) (Generic) Every Other Day/30 Days Discharge Instructions: Secure with Kerlix as directed. 1. Still using silver collagen on the left under a total contact cast. 2. We are much more superficial on the right 3. Consider Hydrofera Blue if we are not making  progress  in terms of the surface dressing. 4. This is only a week #2 the total contact cast on the left. He appears to be tolerating it satisfactorily Electronic Signature(s) Signed: 09/16/2021 5:29:10 PM By: Linton Ham MD Entered By: Linton Ham on 09/16/2021 10:33:10 -------------------------------------------------------------------------------- Total Contact Cast Details Patient Name: Date of Service: [name], [place] 09/16/2021 9:30 A M Medical Record Number: [id number] Patient Account Number: [id number] Date of Birth/Sex: Treating RN: [birth year] (46 y.o. [name] Primary Care Provider: [name] Other Clinician: Referring Provider: Treating Provider/Extender: [name] in Treatment: 66 T Contact Cast Applied for Wound Assessment: otal Wound #2 Left Calcaneus Performed By: Physician Ricard Dillon., MD Post Procedure Diagnosis Same as Pre-procedure Electronic Signature(s) Signed: 09/16/2021 5:29:10 PM By: Linton Ham MD Entered By: Linton Ham on 09/16/2021 10:33:43 -------------------------------------------------------------------------------- SuperBill Details Patient Name: Date of Service: [name], [place]. 09/16/2021 Medical Record Number: [id number] Patient Account Number: [id number] Date of Birth/Sex: Treating RN: 1976/02/22 (45 y.o. [name] Primary Care Provider: [name] Other Clinician: Referring Provider: Treating Provider/Extender: [name] in Treatment: 61 Diagnosis Coding ICD-10 Codes Code Description [phone] Non-pressure chronic ulcer of other part of right foot with fat layer exposed L97.522 Non-pressure chronic ulcer of other part of left foot with fat layer exposed Facility Procedures CPT4 Code: [id number] 2 Description: 9445 - APPLY TOTAL CONTACT LEG CAST ICD-10 Diagnosis Description L97.522 Non-pressure chronic ulcer of other part of left foot with fat layer  exposed Modifier: Quantity: 1 Physician Procedures : CPT4 Code Description Modifier [id number] - WC PHYS APPLY TOTAL CONTACT CAST ICD-10 Diagnosis Description L97.522 Non-pressure chronic ulcer of other part of left foot with fat layer exposed Quantity: 1 Electronic Signature(s) Signed: 09/16/2021 5:29:10 PM By: Linton Ham MD Entered By: Linton Ham on 09/16/2021 10:34:10

## 2021-09-16 NOTE — Progress Notes (Signed)
STEVEN, [name] ([id number]) Visit Report for 09/16/2021 Arrival Information Details Patient Name: Date of Service: [name], [name] 09/16/2021 9:30 A M Medical Record Number: [id number] Patient Account Number: [id number] Date of Birth/Sex: Treating RN: May 27, 1976 (46 y.o. [name] Primary Care [name]: [name] Other Clinician: Referring [name]: Treating [name]/Extender: [name] in Treatment: 41 Visit Information History Since Last Visit Added or deleted any medications: No Patient Arrived: Wheel Chair Any new allergies or adverse reactions: No Arrival Time: 09:27 Had a fall or experienced change in No Accompanied By: spouse activities of daily living that may affect Transfer Assistance: None risk of falls: Patient Requires Transmission-Based Precautions: No Signs or symptoms of abuse/neglect since last visito No Patient Has Alerts: No Hospitalized since last visit: No Implantable device outside of the clinic excluding No cellular tissue based products placed in the center since last visit: Has Dressing in Place as Prescribed: Yes Has Compression in Place as Prescribed: Yes Pain Present Now: Yes Electronic Signature(s) Signed: 09/16/2021 12:08:59 PM By: [name] Catholic RN Entered By: [name] on 09/16/2021 09:32:36 -------------------------------------------------------------------------------- Encounter Discharge Information Details Patient Name: Date of Service: [name], [name]. 09/16/2021 9:30 A M Medical Record Number: [id number] Patient Account Number: [id number] Date of Birth/Sex: Treating RN: [birth year] (46 y.o. [name] Primary Care [name]: [name] Other Clinician: Referring Yekaterina Escutia: Treating [name]/Extender: [name] in Treatment: 70 Encounter Discharge Information Items Discharge Condition: Stable Ambulatory Status: Wheelchair Discharge Destination:  Home Transportation: Private Auto Accompanied By: spouse Schedule Follow-up Appointment: Yes Clinical Summary of Care: Patient Declined Electronic Signature(s) Signed: 09/16/2021 12:08:59 PM By: [name] Catholic RN Entered By: [name] on 09/16/2021 11:51:13 -------------------------------------------------------------------------------- Lower Extremity Assessment Details Patient Name: Date of Service: [name], [name] 09/16/2021 9:30 A M Medical Record Number: [id number] Patient Account Number: [id number] Date of Birth/Sex: Treating RN: 1976/08/08 (46 y.o. [name] Primary Care [name]: [name] Other Clinician: Referring [name]: Treating Karlen Barbar/Extender: [name] in Treatment: 61 Edema Assessment Assessed: [name]: No] [name]: No] Edema: [Left: No] [Right: No] Calf Left: Right: Point of Measurement: 29 cm From Medial Instep 41.8 cm 42.9 cm Ankle Left: Right: Point of Measurement: 9 cm From Medial Instep 24.8 cm 24.1 cm Electronic Signature(s) Signed: 09/16/2021 12:08:59 PM By: [name] Catholic RN Entered By: [name] on 09/16/2021 09:59:59 -------------------------------------------------------------------------------- Multi Wound Chart Details Patient Name: Date of Service: [name], [name] 09/16/2021 9:30 A M Medical Record Number: [id number] Patient Account Number: [id number] Date of Birth/Sex: Treating RN: 02-May-1976 (46 y.o. [name] Primary Care Temiloluwa Recchia: [name] Other Clinician: Referring [name]: Treating [name]/Extender: [name] in Treatment: 61 Vital Signs Height(in): 69 Pulse(bpm): Weight(lbs): 280 Blood Pressure(mmHg): Body Mass Index(BMI): 41.3 Temperature(F): Respiratory Rate(breaths/min): 18 Photos: [N/A:N/A] Right Calcaneus Left Calcaneus N/A Wound Location: Pressure Injury Pressure Injury N/A Wounding Event: Pressure Ulcer Pressure Ulcer  N/A Primary Etiology: Asthma, Angina, Hypertension Asthma, Angina, Hypertension N/A Comorbid History: 10/07/2019 10/07/2019 N/A Date Acquired: 61 61 N/A Weeks of Treatment: Open Open N/A Wound Status: No No N/A Wound Recurrence: 2.3x0.5x0.5 2.5x0.6x0.4 N/A Measurements L x W x D (cm) 0.903 1.178 N/A A (cm) : rea 0.452 0.471 N/A Volume (cm) : 97.10% 95.70% N/A % Reduction in A rea: 98.60% 98.30% N/A % Reduction in Volume: Category/Stage III Category/Stage III N/A Classification: Medium Medium N/A Exudate A mount: Serosanguineous Serosanguineous N/A Exudate Type: red, brown red, brown N/A Exudate Color:  Small (1-33%) Small (1-33%) N/A Granulation A mount: Pink Pink N/A Granulation Quality: Large (67-100%) Large (67-100%) N/A Necrotic A mount: Fat Layer (Subcutaneous Tissue): Yes Fat Layer (Subcutaneous Tissue): Yes N/A Exposed Structures: Fascia: No Fascia: No Tendon: No Tendon: No Muscle: No Muscle: No Joint: No Joint: No Bone: No Bone: No Small (1-33%) Small (1-33%) N/A Epithelialization: N/A T Contact Cast otal N/A Procedures Performed: Treatment Notes Electronic Signature(s) Signed: 09/16/2021 12:08:59 PM By: [name] Catholic RN Signed: 09/16/2021 5:29:10 PM By: [name] Ham MD Entered By: [name] on 09/16/2021 10:30:23 -------------------------------------------------------------------------------- Multi-Disciplinary Care Plan Details Patient Name: Date of Service: [name], [name] 09/16/2021 9:30 A M Medical Record Number: [id number] Patient Account Number: [id number] Date of Birth/Sex: Treating RN: 1976-07-16 (46 y.o. [name] Primary Care [name]: [name] Other Clinician: Referring [name]: Treating [name]/Extender: [name] in Treatment: 28 Multidisciplinary Care Plan reviewed with physician Active Inactive Abuse / Safety / Falls / Self Care Management Nursing Diagnoses: Potential for  falls Goals: Patient/caregiver will verbalize/demonstrate measures taken to prevent injury and/or falls Date Initiated: 07/15/2020 Target Resolution Date: 10/11/2021 Goal Status: Active Interventions: Assess fall risk on admission and as needed Assess impairment of mobility on admission and as needed per policy Notes: Wound/Skin Impairment Nursing Diagnoses: Impaired tissue integrity Knowledge deficit related to ulceration/compromised skin integrity Goals: Patient/caregiver will verbalize understanding of skin care regimen Date Initiated: 07/15/2020 Target Resolution Date: 10/11/2021 Goal Status: Active Ulcer/skin breakdown will have a volume reduction of 30% by week 4 Date Initiated: 07/15/2020 Date Inactivated: 08/20/2020 Target Resolution Date: 09/03/2020 Goal Status: Unmet Unmet Reason: no major changes. Ulcer/skin breakdown will heal within 14 weeks Date Initiated: 12/04/2020 Date Inactivated: 12/10/2020 Target Resolution Date: 12/10/2020 Unmet Reason: wounds still open at 14 Goal Status: Unmet weeks and today 21 weeks. Interventions: Assess patient/caregiver ability to obtain necessary supplies Assess patient/caregiver ability to perform ulcer/skin care regimen upon admission and as needed Assess ulceration(s) every visit Provide education on ulcer and skin care Treatment Activities: Skin care regimen initiated : 07/15/2020 Topical wound management initiated : 07/15/2020 Notes: Electronic Signature(s) Signed: 09/16/2021 6:05:40 PM By: Levan Hurst RN, BSN Entered By: Levan Hurst on 09/16/2021 09:46:24 -------------------------------------------------------------------------------- Pain Assessment Details Patient Name: Date of Service: [name], [place] 09/16/2021 9:30 A M Medical Record Number: [id number] Patient Account Number: [id number] Date of Birth/Sex: Treating RN: February 21, 1976 (46 y.o. [name] Primary Care Eli Adami: [name] Other Clinician: Referring  Corrina Steffensen: Treating Max Nuno/Extender: [name] in Treatment: 35 Active Problems Location of Pain Severity and Description of Pain Patient Has Paino Yes Site Locations Pain Location: Generalized Pain With Dressing Change: No Duration of the Pain. Constant / Intermittento Constant Rate the pain. Current Pain Level: 4 Worst Pain Level: 10 Least Pain Level: 4 Tolerable Pain Level: 10 Character of Pain Describe the Pain: Difficult to Pinpoint Pain Management and Medication Current Pain Management: Medication: No Cold Application: No Rest: No Massage: No Activity: No T.E.N.S.: Yes Heat Application: No Leg drop or elevation: No Is the Current Pain Management Adequate: Inadequate Electronic Signature(s) Signed: 09/16/2021 12:08:59 PM By: [name] Catholic RN Entered By: [name] on 09/16/2021 09:56:04 -------------------------------------------------------------------------------- Patient/Caregiver Education Details Patient Name: Date of Service: Ciro Backer 2/9/2023andnbsp9:30 A M Medical Record Number: [id number] Patient Account Number: [id number] Date of Birth/Gender: Treating RN: 01-May-1976 (46 y.o. [name] Primary Care Physician: [name] Other Clinician: Referring Physician: Treating Physician/Extender: [name] in Treatment:  9 Education Assessment Education Provided To: Patient Education Topics Provided Wound/Skin Impairment: Methods: Explain/Verbal Responses: State content correctly Electronic Signature(s) Signed: 09/16/2021 6:05:40 PM By: Levan Hurst RN, BSN Entered By: Levan Hurst on 09/16/2021 09:46:41 -------------------------------------------------------------------------------- Wound Assessment Details Patient Name: Date of Service: [name], [place] 09/16/2021 9:30 A M Medical Record Number: [id number] Patient Account Number: [id number] Date of Birth/Sex: Treating  RN: 05-31-1976 (46 y.o. [name] Primary Care Glennda Weatherholtz: [name] Other Clinician: Referring Brock Mokry: Treating Myers Tutterow/Extender: [name] in Treatment: 61 Wound Status Wound Number: 1 Primary Etiology: Pressure Ulcer Wound Location: Right Calcaneus Wound Status: Open Wounding Event: Pressure Injury Comorbid History: Asthma, Angina, Hypertension Date Acquired: 10/07/2019 Weeks Of Treatment: 61 Clustered Wound: No Photos Wound Measurements Length: (cm) 2.3 Width: (cm) 0.5 Depth: (cm) 0.5 Area: (cm) 0.903 Volume: (cm) 0.452 % Reduction in Area: 97.1% % Reduction in Volume: 98.6% Epithelialization: Small (1-33%) Tunneling: No Undermining: No Wound Description Classification: Category/Stage III Exudate Amount: Medium Exudate Type: Serosanguineous Exudate Color: red, brown Foul Odor After Cleansing: No Slough/Fibrino Yes Wound Bed Granulation Amount: Small (1-33%) Exposed Structure Granulation Quality: Pink Fascia Exposed: No Necrotic Amount: Large (67-100%) Fat Layer (Subcutaneous Tissue) Exposed: Yes Necrotic Quality: Adherent Slough Tendon Exposed: No Muscle Exposed: No Joint Exposed: No Bone Exposed: No Treatment Notes Wound #1 (Calcaneus) Wound Laterality: Right Cleanser Normal Saline Discharge Instruction: Cleanse the wound with Normal Saline prior to applying a clean dressing using gauze sponges, not tissue or cotton balls. Wound Cleanser Discharge Instruction: Cleanse the wound with wound cleanser prior to applying a clean dressing using gauze sponges, not tissue or cotton balls. Peri-Wound Care Topical Primary Dressing Promogran Prisma Matrix, 4.34 (sq in) (silver collagen) Discharge Instruction: Moisten collagen with saline or hydrogel Secondary Dressing Secured With Elastic Bandage 4 inch (ACE bandage) Discharge Instruction: Secure with ACE bandage as directed. Kerlix Roll Sterile, 4.5x3.1 (in/yd) Discharge  Instruction: Secure with Kerlix as directed. Compression Wrap Compression Stockings Add-Ons Electronic Signature(s) Signed: 09/16/2021 12:08:59 PM By: [name] Catholic RN Entered By: [name] on 09/16/2021 10:01:27 -------------------------------------------------------------------------------- Wound Assessment Details Patient Name: Date of Service: [name], [place] 09/16/2021 9:30 A M Medical Record Number: [id number] Patient Account Number: [id number] Date of Birth/Sex: Treating RN: 1975/10/19 (46 y.o. [name] Primary Care Daryn Pisani: [name] Other Clinician: Referring Jozette Castrellon: Treating Lathaniel Legate/Extender: [name] in Treatment: 61 Wound Status Wound Number: 2 Primary Etiology: Pressure Ulcer Wound Location: Left Calcaneus Wound Status: Open Wounding Event: Pressure Injury Comorbid History: Asthma, Angina, Hypertension Date Acquired: 10/07/2019 Weeks Of Treatment: 61 Clustered Wound: No Photos Wound Measurements Length: (cm) 2.5 Width: (cm) 0.6 Depth: (cm) 0.4 Area: (cm) 1.178 Volume: (cm) 0.471 % Reduction in Area: 95.7% % Reduction in Volume: 98.3% Epithelialization: Small (1-33%) Tunneling: No Undermining: No Wound Description Classification: Category/Stage III Exudate Amount: Medium Exudate Type: Serosanguineous Exudate Color: red, brown Foul Odor After Cleansing: No Slough/Fibrino Yes Wound Bed Granulation Amount: Small (1-33%) Exposed Structure Granulation Quality: Pink Fascia Exposed: No Necrotic Amount: Large (67-100%) Fat Layer (Subcutaneous Tissue) Exposed: Yes Necrotic Quality: Adherent Slough Tendon Exposed: No Muscle Exposed: No Joint Exposed: No Bone Exposed: No Treatment Notes Wound #2 (Calcaneus) Wound Laterality: Left Cleanser Normal Saline Discharge Instruction: Cleanse the wound with Normal Saline prior to applying a clean dressing using gauze sponges, not tissue or cotton balls. Wound  Cleanser Discharge Instruction: Cleanse the wound with wound cleanser prior to applying a clean dressing using gauze sponges, not tissue or  cotton balls. Peri-Wound Care Topical Primary Dressing Promogran Prisma Matrix, 4.34 (sq in) (silver collagen) Discharge Instruction: Moisten collagen with saline or hydrogel Secondary Dressing Secured With Kerlix Roll Sterile, 4.5x3.1 (in/yd) Discharge Instruction: Secure with Kerlix as directed. Compression Wrap Compression Stockings Add-Ons Electronic Signature(s) Signed: 09/16/2021 12:08:59 PM By: [name] Catholic RN Entered By: [name] on 09/16/2021 10:02:22 -------------------------------------------------------------------------------- Vitals Details Patient Name: Date of Service: [name], [place] 09/16/2021 9:30 A M Medical Record Number: [id number] Patient Account Number: [id number] Date of Birth/Sex: Treating RN: [birth year] (46 y.o. [name] Primary Care Elvenia Godden: [name] Other Clinician: Referring Krayton Wortley: Treating Demisha Nokes/Extender: [name] in Treatment: 61 Vital Signs Time Taken: 09:32 Temperature (F): 98.6 Height (in): 69 Pulse (bpm): 87 Weight (lbs): 280 Respiratory Rate (breaths/min): 18 Body Mass Index (BMI): 41.3 Blood Pressure (mmHg): 141/84 Reference Range: 80 - 120 mg / dl Electronic Signature(s) Signed: 09/16/2021 6:02:14 PM By: [name] Catholic RN Previous Signature: 09/16/2021 12:08:59 PM Version By: [name] Catholic RN Entered By: [name] on 09/16/2021 14:31:32

## 2021-09-23 ENCOUNTER — Encounter (HOSPITAL_BASED_OUTPATIENT_CLINIC_OR_DEPARTMENT_OTHER): Payer: BC Managed Care – PPO | Admitting: Internal Medicine

## 2021-09-23 ENCOUNTER — Other Ambulatory Visit: Payer: Self-pay

## 2021-09-23 DIAGNOSIS — L97522 Non-pressure chronic ulcer of other part of left foot with fat layer exposed: Secondary | ICD-10-CM | POA: Diagnosis not present

## 2021-09-23 DIAGNOSIS — L97512 Non-pressure chronic ulcer of other part of right foot with fat layer exposed: Secondary | ICD-10-CM | POA: Diagnosis not present

## 2021-09-23 DIAGNOSIS — Z8616 Personal history of COVID-19: Secondary | ICD-10-CM | POA: Diagnosis not present

## 2021-09-23 DIAGNOSIS — I10 Essential (primary) hypertension: Secondary | ICD-10-CM | POA: Diagnosis not present

## 2021-09-23 DIAGNOSIS — L89620 Pressure ulcer of left heel, unstageable: Secondary | ICD-10-CM | POA: Diagnosis not present

## 2021-09-23 NOTE — Progress Notes (Signed)
AADEN, BUCKMAN ([id number]) Visit Report for 09/23/2021 HPI Details Patient Name: Date of Service: [name], [name] E. 09/23/2021 3:15 PM Medical Record Number: [id number] Patient Account Number: [id number] Date of Birth/Sex: Treating RN: Jan 06, 1976 (46 y.o. [name] Primary Care Provider: [name] Other Clinician: Referring Provider: Treating Provider/Extender: [name] in Treatment: 30 History of Present Illness HPI Description: Wounds are12/03/2020 upon evaluation today patient presents for initial inspection here in our clinic concerning issues he has been having with the bottoms of his feet bilaterally. He states these actually occurred as wounds when he was hospitalized for 5 months secondary to Covid. He was apparently with tilting bed where he was in an upright position quite frequently and apparently this occurred in some way shape or form during that time. Fortunately there is no sign of active infection at this time. No fevers, chills, nausea, vomiting, or diarrhea. With that being said he still has substantial wounds on the plantar aspects of his feet Theragen require quite a bit of work to get these to heal. He has been using Santyl currently though that is been problematic both in receiving the medication as well as actually paid for it as it is become quite expensive. Prior to the experience with Covid the patient really did not have any major medical problems other than hypertension he does have some mild generalized weakness following the Covid experience. 07/22/2020 on evaluation today patient appears to be doing okay in regard to his foot ulcers I feel like the wound beds are showing signs of better improvement that I do believe the Iodoflex is helping in this regard. With that being said he does have a lot of drainage currently and this is somewhat blue/green in nature which is consistent with Pseudomonas. I do think a culture today would be  appropriate for [place] to evaluate and see if that is indeed the case I would likely start him on antibiotic orally as well he is not allergic to Cipro knows of no issues he has had in the past 12/21; patient was admitted to the clinic earlier this month with bilateral presumed pressure ulcers on the bottom of his feet apparently related to excessive pressure from a tilt table arrangement in the intensive care unit. Patient relates this to being on ECMO but I am not really sure that is exactly related to that. I must say I have never seen anything like this. He has fairly extensive full-thickness wounds extending from his heel towards his midfoot mostly centered laterally. There is already been some healing distally. He does not appear to have an arterial issue. He has been using gentamicin to the wound surfaces with Iodoflex to help with ongoing debridement 1/6; this is a patient with pressure ulcers on the bottom of his feet related to excessive pressure from a standing position in the intensive care unit. He is complaining of a lot of pain in the right heel. He is not a diabetic. 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 We have been using Iodoflex to help prepare the surfaces of both wounds for an advanced treatment product. He is nonambulatory spending most of his time in a wheelchair I have asked him not to propel the wheelchair with his heels 1/13; in general his wounds look better not much surface area change we have been using Iodoflex as of last week. I did an x-ray of the right heel as the patient was complaining of pain. I  had some thoughts about a stress fracture perhaps Achilles tendon problems however what it showed was erosive changes along the inferior aspect of the calcaneus he now has a MRI booked for 1/20. 1/20; in general his wounds continue to be better. Some improvement in the large narrow areas proximally in his foot. He is still complaining of pain in the  right heel and tenderness in certain areas of this wound. His MRI is tonight. I am not just looking for osteomyelitis that was brought up on the x-ray I am wondering about stress fractures, tendon ruptures etc. He has no such findings on the left. Also noteworthy is that the patient had critical illness neuropathy and some of the discomfort may be actual improvement in nerve function I am just not sure. These wounds were initially in the setting of severe critical illness related to COVID-19. He was put in a standing position. He may have also been on pressors at the point contributing to tissue ischemia. By his description at some point these wounds were grossly necrotic extending proximally up into the Achilles part of his heel. I do not know that I have ever really seen pictures of them like this although they may exist in epic We have ordered Tri layer Oasis. I am trying to stimulate some granulation in these areas. This is of course assuming the MRI is negative for infection 1/27; since the patient was last here he saw Dr. Juleen China of infectious disease. He is planned for vancomycin and ceftriaxone. Prior operative culture grew MSSA. Also ordered baseline lab work. He also ordered arterial studies although the ABIs in our clinic were normal as well as his clinical exam these were normal I do not think he needs to see vascular surgery. His ABIs at the [facility] were 1.22 in the right triphasic waveforms with a normal TBI of 1.15 on the left ABI of 1.22 with triphasic waveforms and a normal TBI of 1.08. Finally he saw Dr. Amalia Hailey who will follow him in for 2 months. At this point I do not think he felt that he needed a procedure on the right calcaneal bone. Dr. Juleen China is elected for broad-spectrum antibiotic The patient is still having pain in the right heel. He walks with a walker 2/3; wounds are generally smaller. He is tolerating his IV antibiotics. I believe this is vancomycin and ceftriaxone. We are  still waiting for Oasis burn in terms of his out-of-pocket max which he should be meeting soon given the IV antibiotics, MRIs etc. I have asked him to check in on this. We are using silver collagen in the meantime the wounds look better 2/10; tolerating IV vancomycin and Rocephin. We are waiting to apply for Oasis. Although I am not really sure where he is in his out-of-pocket max. 2/17 started the first application of Oasis trilayer. Still on antibiotics. The wounds have generally look better. The area on the left has a little more surface slough requiring debridement 1/61; second application of Oasis trilayer. The wound surface granulation is generally look better. The area on the left with undermining laterally I think is come in a bit. 10/08/2020 upon evaluation today patient is here today for [employer] application #3. Fortunately he seems to be doing extremely well with regard to this and we are seeing a lot of new epithelial growth which is great news. Fortunately there is no signs of active infection at this time. 10/16/2020 upon evaluation today patient appears to be doing well with regard  to his foot ulcers. Do believe the Oasis has been of benefit for him. I do not see any signs of infection right now which is great news and I think that he has a lot of new epithelial growth which is great to see as well. The patient is very pleased to hear all of this. I do think we can proceed with the Oasis trilayer #4 today. 3/18; not as much improvement in these areas on his heels that I was hoping. I did reapply trilateral Oasis today the tissue looks healthier but not as much fill in as I was hoping. 3/25; better looking today I think this is come in a bit the tissue looks healthier. Triple layer Oasis reapplied #6 4/1; somewhat better looking definitely better looking surface not as much change in surface area as I was hoping. He may be spending more time [place] on days then he needs to although  he does have heel offloading boots. Triple layer Oasis reapplied #7 4/7; unfortunately apparently [facility] will not approve any further Oasis which is unfortunate since the patient did respond nicely both in terms of the condition of the wound bed as well as surface area. There is still some drainage coming from the wound but not a lot there does not appear to be any infection 4/15; we have been using Hydrofera Blue. He continues to have nice rims of epithelialization on the right greater than the left. The left the epithelialization is coming from the tip of his heel. There is moderate drainage. In this that concerns me about a total contact cast. There is no evidence of infection 4/29; patient has been using Hydrofera Blue with dressing changes. He has no complaints or issues today. 5/5; using Hydrofera Blue. I actually think that he looks marginally better than the last time I saw this 3 weeks ago. There are rims of epithelialization on the left thumb coming from the medial side on the right. Using Hydrofera Blue 5/12; using Hydrofera Blue. These continue to make improvements in surface area. His drainage was not listed as severe I therefore went ahead and put a cast on the left foot. Right foot we will continue to dress his previous 5/16; back for first total contact cast change. He did not tolerate this particularly well cast injury on the anterior tibia among other issues. Difficulty sleeping. I talked him about this in some detail and afterwards is elected to continue. I told him I would like to have a cast on for 3 weeks to see if this is going to help at all. I think he agreed 5/19; I think the wound is better. There is no tunneling towards his midfoot. The undermining medially also looks better. He has a rim of new skin distally. I think we are making progress here. The area on the left also continues to look somewhat better to me using Hydrofera Blue. He has a list of  complaints about the cast but none of them seem serious 5/26; patient presents for 1 week follow-up. He has been using a total contact cast and tolerating this well. Hydrofera Blue is the main dressing used. He denies signs of infection. 6/2 Hydrofera Blue total contact cast on the left. These were large ulcers that formed in intensive care unit where the patient was recovering from [place]. May have had something to do with being ventilated in an upright positiono Pressors etc. We have been able to get the areas down considerably and a  viable surface. There is some epithelialization in both sides. Note made of drainage 6/9; changed to [facility] last time because of drainage. He arrives with better looking surfaces and dimensions on the left than the right. Paradoxically the right actually probes more towards his midfoot the left is largely close down but both of these look improved. Using a total contact cast on the left 6/16; complex wounds on his bilateral plantar heels which were initially pressure injury from a stay in the ICU with COVID. We have been using silver alginate most recently. His dimensions of come in quite dramatically however not recently. We have been putting the left foot in a total contact cast 6/23; complex wounds on the bilateral plantar heels. I been putting the left in the cast paradoxically the area on the right is the one that is going towards closure at a faster rate. Quite a bit of drainage on the left. The patient went to see Dr. Amalia Hailey who said he was going to standby for skin grafts. I had actually considered sending him for skin grafts however he would be mandatorily off his feet for a period of weeks to months. I am thinking that the area on the right is going to close on its own the area on the left has been more stubborn even though we have him in a total contact cast 6/30; took him out of a total contact cast last week is the right heel seem to be making better  progress than the left where I was placing the cast. We are using silver alginate. Both wounds are smaller right greater than left 7/12; both wounds look as though they are making some progress. We are using silver alginate. Heel offloading boots 7/26; very gradual progress especially on the right. Using silver alginate. He is wearing heel offloading boots 8/18; he continues to close these wounds down very gradually. Using silver alginate. The problem polymen being definitive about this is areas of what appears to be callus around the margins. This is not a 100% of the area but certainly sizable especially on the right 9/1; bilateral plantar feet wounds secondary to prolonged pressure while being ventilated for COVID-19 in an upright position. Essentially pressure ulcers on the bottom of his feet. He is made substantial progress using silver alginate. 9/14; bilateral plantar feet wounds secondary to prolonged pressure. Making progress using silver alginate. 9/29 bilateral plantar feet wounds secondary to prolonged pressure. I changed him to Iodoflex last week. MolecuLight showing reddened blush fluorescence 10/11; patient presents for follow-up. He has no issues or complaints today. He denies signs of infection. He continues to use Iodoflex and antibiotic ointment to the wound beds. 10/27; 2-week follow-up. No evidence of infection. He has callus and thick dry skin around the wound margins we have been using Iodoflex and Bactroban which was in response to a moderate left MolecuLight reddish blush fluorescence. 11/10; 2-week follow-up. Wound margins again have thick callus however the measurements of the actual wound sites are a lot smaller. Everything looks reasonably healthy here. We have been using Iodoflex He was approved for prime matrix but I have elected to delay this given the improvement in the surface area. Hopefully I will not regret that decision as were getting close to the end of the  year in terms of insurance payment 12/8; 2-week follow-up. Wounds are generally smaller in size. These were initially substantial wounds extending into the forefoot all the way into the heel on the bilateral plantar feet.  They are now both located on the plantar heel distal aspect both of these have a lot of callus around the wounds I used a #5 curette to remove this on the right and the left also some subcutaneous debris to try and get the wound edges were using Iodoflex. He has heel offloading shoe 12/22; 2-week follow-up. Not really much improvement. He has thick callus around the outer edges of both wounds. I remove this there is some nonviable subcutaneous tissue as well. We have been using Iodoflex. Her intake nurse and myself spontaneously thought of a total contact cast I went back in May. At that time we really were not seeing much of an improvement with a cast although the wound was in a much different situation I would like to retry this in 2 weeks and I discussed this with the patient 08/12/2021; the patient has had some improvement with the Iodoflex. The the area on the left heel plantar more improved than the right. I had to put him in a total contact cast on the left although I decided to put that off for 2 weeks. I am going to change his primary dressing to silver collagen. I think in both areas he has had some improvement most of the healing seems to be more proximal in the heel. The wounds are in the mid aspect. A lot of thick callus on the right heel however. 1/19; we are using silver collagen on both plantar heel areas. He has had some improvement today. The left did not require any debridement. He still had some eschar on the right that was debrided but both seem to have contracted. I did not put it total contact cast on him today 2/2 we have been using silver collagen. The area on the right plantar heel has areas that appear to be epithelialized interspersed with dry flaking callus  and dry skin. I removed this. This really looks better than on the other side. On the left still a large area with raised edges and debris on the surface. 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 He has no issues or complaints today. 2/9; not much change in the left foot wound with 1 week of a cast we are using silver collagen. Silver collagen on the right side. The right side has been the better wound surface. We will reapply the total contact cast on the left 2/16; not much improvement on either side I been using silver collagen with a total contact cast on the left. I'm changing the Hydrofera Blue still with a total contact cast on the left Electronic Signature(s) Signed: 09/23/2021 5:55:17 PM By: Linton Ham MD Entered By: Linton Ham on 09/23/2021 16:42:04 -------------------------------------------------------------------------------- Physical Exam Details Patient Name: Date of Service: [name], Peggs. 09/23/2021 3:15 PM Medical Record Number: [id number] Patient Account Number: [id number] Date of Birth/Sex: Treating RN: 05-Mar-1976 (46 y.o. [name] Primary Care Provider: [name] Other Clinician: Referring Provider: Treating Provider/Extender: [name] in Treatment: 92 Constitutional Patient is hypertensive.. Pulse regular and within target range for patient.Marland Kitchen Respirations regular, non-labored and within target range.. Temperature is normal and within the target range for the patient.Marland Kitchen Appears in no distress. Notes we exam; left heel about the same as last time. Still raised edges callus even under cast. Surface of the wound does not look too  bad. Right heel also about the same still some depth granulation looks reasonably healthy There is absolutely real fat pad and I wonder whether this will ever  function is proper weight-bearing surface No evidence of infection Electronic Signature(s) Signed: 09/23/2021 5:55:17 PM By: Linton Ham MD Entered By: Linton Ham on 09/23/2021 16:44:10 -------------------------------------------------------------------------------- Physician Orders Details Patient Name: Date of Service: [name], [place]. 09/23/2021 3:15 PM Medical Record Number: [id number] Patient Account Number: [id number] Date of Birth/Sex: Treating RN: [birth year] (46 y.o. [name] Primary Care Provider: [name] Other Clinician: Referring Provider: Treating Provider/Extender: [name] in Treatment: 84 Verbal / Phone Orders: No Diagnosis Coding ICD-10 Coding Code Description [phone] Non-pressure chronic ulcer of other part of right foot with fat layer exposed L97.522 Non-pressure chronic ulcer of other part of left foot with fat layer exposed Follow-up Appointments ppointment in 1 week. - Dr Dellia Nims Thursday room 1 Return A Bathing/ Shower/ Hygiene May shower with protection but do not get wound dressing(s) wet. - Cover with cast protector (can purchase cast protector at [facility] or [facility] ) Edema Control - Lymphedema / SCD / Other Bilateral Lower Extremities Elevate legs to the level of the heart or above for 30 minutes daily and/or when sitting, a frequency of: - throughout the day Avoid standing for long periods of time. Moisturize legs daily. - right leg and foot every night. Off-Loading Total Contact Cast to Left Lower Extremity Wedge shoe to: - Glophed Shoe to right foot Other: - keep pressure off of the bottom of your feet Additional Orders / Instructions Follow Nutritious Diet Wound Treatment Wound #1 - Calcaneus Wound Laterality: Right Cleanser: Normal Saline (Generic) Every Other Day/30 Days Discharge Instructions: Cleanse the wound with Normal Saline prior to applying a clean dressing using gauze sponges, not tissue or  cotton balls. Cleanser: Wound Cleanser Every Other Day/30 Days Discharge Instructions: Cleanse the wound with wound cleanser prior to applying a clean dressing using gauze sponges, not tissue or cotton balls. Prim Dressing: Hydrofera Blue Classic Foam, 2x2 in Every Other Day/30 Days ary Discharge Instructions: Moisten with saline prior to applying to wound bed Secondary Dressing: Zetuvit Plus 4x8 in Every Other Day/30 Days Discharge Instructions: Apply over primary dressing as directed. Secured With: Elastic Bandage 4 inch (ACE bandage) (Generic) Every Other Day/30 Days Discharge Instructions: Secure with ACE bandage as directed. Secured With: [employer], 4.5x3.1 (in/yd) (Generic) Every Other Day/30 Days Discharge Instructions: Secure with Kerlix as directed. Wound #2 - Calcaneus Wound Laterality: Left Cleanser: Normal Saline (Generic) 1 x Per Week/30 Days Discharge Instructions: Cleanse the wound with Normal Saline prior to applying a clean dressing using gauze sponges, not tissue or cotton balls. Cleanser: Wound Cleanser 1 x Per Week/30 Days Discharge Instructions: Cleanse the wound with wound cleanser prior to applying a clean dressing using gauze sponges, not tissue or cotton balls. Prim Dressing: Hydrofera Blue Classic Foam, 2x2 in 1 x Per Week/30 Days ary Discharge Instructions: Moisten with saline prior to applying to wound bed Secondary Dressing: Zetuvit Plus 4x8 in 1 x Per Week/30 Days Discharge Instructions: Apply over primary dressing as directed. Secured With: [employer], 4.5x3.1 (in/yd) (Generic) 1 x Per Week/30 Days Discharge Instructions: Secure with Kerlix as directed. Secured With: 44M Medipore H Soft Cloth Surgical T ape, 4 x 10 (in/yd) 1 x Per Week/30 Days Discharge Instructions: Secure with tape as directed. Electronic Signature(s) Signed: 09/23/2021 5:28:56 PM By: Baruch Gouty RN, BSN Signed: 09/23/2021 5:55:17  PM By: Linton Ham MD Entered By:  Baruch Gouty on 09/23/2021 16:10:29 -------------------------------------------------------------------------------- Problem List Details Patient Name: Date of Service: [name], Jackson. 09/23/2021 3:15 PM Medical Record Number: [id number] Patient Account Number: [id number] Date of Birth/Sex: Treating RN: July 29, 1976 (46 y.o. [name] Primary Care Provider: [name] Other Clinician: Referring Provider: Treating Provider/Extender: [name] in Treatment: 49 Active Problems ICD-10 Encounter Code Description Active Date MDM Diagnosis L97.512 Non-pressure chronic ulcer of other part of right foot with fat layer exposed 09/03/2020 No Yes L97.522 Non-pressure chronic ulcer of other part of left foot with fat layer exposed 09/03/2020 No Yes Inactive Problems ICD-10 Code Description Active Date Inactive Date L89.893 Pressure ulcer of other site, stage 3 07/15/2020 07/15/2020 M62.81 Muscle weakness (generalized) 07/15/2020 07/15/2020 I10 Essential (primary) hypertension 07/15/2020 07/15/2020 M86.171 Other acute osteomyelitis, right ankle and foot 09/03/2020 09/03/2020 Resolved Problems Electronic Signature(s) Signed: 09/23/2021 5:55:17 PM By: Linton Ham MD Entered By: Linton Ham on 09/23/2021 16:40:26 -------------------------------------------------------------------------------- Progress Note Details Patient Name: Date of Service: [name], Dale. 09/23/2021 3:15 PM Medical Record Number: [id number] Patient Account Number: [id number] Date of Birth/Sex: Treating RN: [birth year] (46 y.o. [name] Primary Care Provider: [name] Other Clinician: Referring Provider: Treating Provider/Extender: [name] in Treatment: 62 Subjective History of Present Illness (HPI) Wounds are12/03/2020 upon evaluation today patient presents for initial inspection here in our clinic concerning issues he has been having with the  bottoms of his feet bilaterally. He states these actually occurred as wounds when he was hospitalized for 5 months secondary to Covid. He was apparently with tilting bed where he was in an upright position quite frequently and apparently this occurred in some way shape or form during that time. Fortunately there is no sign of active infection at this time. No fevers, chills, nausea, vomiting, or diarrhea. With that being said he still has substantial wounds on the plantar aspects of his feet Theragen require quite a bit of work to get these to heal. He has been using Santyl currently though that is been problematic both in receiving the medication as well as actually paid for it as it is become quite expensive. Prior to the experience with Covid the patient really did not have any major medical problems other than hypertension he does have some mild generalized weakness following the Covid experience. 07/22/2020 on evaluation today patient appears to be doing okay in regard to his foot ulcers I feel like the wound beds are showing signs of better improvement that I do believe the Iodoflex is helping in this regard. With that being said he does have a lot of drainage currently and this is somewhat blue/green in nature which is consistent with Pseudomonas. I do think a culture today would be appropriate for [place] to evaluate and see if that is indeed the case I would likely start him on antibiotic orally as well he is not allergic to Cipro knows of no issues he has had in the past 12/21; patient was admitted to the clinic earlier this month with bilateral presumed pressure ulcers on the bottom of his feet apparently related to excessive pressure from a tilt table arrangement in the intensive care unit. Patient relates this to being on ECMO but I am not really sure that is exactly related to that. I must say I have never seen anything like this. He has fairly extensive full-thickness wounds extending from his  heel towards his midfoot  mostly centered laterally. There is already been some healing distally. He does not appear to have an arterial issue. He has been using gentamicin to the wound surfaces with Iodoflex to help with ongoing debridement 1/6; this is a patient with pressure ulcers on the bottom of his feet related to excessive pressure from a standing position in the intensive care unit. He is complaining of a lot of pain in the right heel. He is not a diabetic. 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 We have been using Iodoflex to help prepare the surfaces of both wounds for an advanced treatment product. He is nonambulatory spending most of his time in a wheelchair I have asked him not to propel the wheelchair with his heels 1/13; in general his wounds look better not much surface area change we have been using Iodoflex as of last week. I did an x-ray of the right heel as the patient was complaining of pain. I had some thoughts about a stress fracture perhaps Achilles tendon problems however what it showed was erosive changes along the inferior aspect of the calcaneus he now has a MRI booked for 1/20. 1/20; in general his wounds continue to be better. Some improvement in the large narrow areas proximally in his foot. He is still complaining of pain in the right heel and tenderness in certain areas of this wound. His MRI is tonight. I am not just looking for osteomyelitis that was brought up on the x-ray I am wondering about stress fractures, tendon ruptures etc. He has no such findings on the left. Also noteworthy is that the patient had critical illness neuropathy and some of the discomfort may be actual improvement in nerve function I am just not sure. These wounds were initially in the setting of severe critical illness related to COVID-19. He was put in a standing position. He may have also been on pressors at the point contributing to tissue ischemia. By his  description at some point these wounds were grossly necrotic extending proximally up into the Achilles part of his heel. I do not know that I have ever really seen pictures of them like this although they may exist in epic We have ordered Tri layer Oasis. I am trying to stimulate some granulation in these areas. This is of course assuming the MRI is negative for infection 1/27; since the patient was last here he saw Dr. Juleen China of infectious disease. He is planned for vancomycin and ceftriaxone. Prior operative culture grew MSSA. Also ordered baseline lab work. He also ordered arterial studies although the ABIs in our clinic were normal as well as his clinical exam these were normal I do not think he needs to see vascular surgery. His ABIs at the [facility] were 1.22 in the right triphasic waveforms with a normal TBI of 1.15 on the left ABI of 1.22 with triphasic waveforms and a normal TBI of 1.08. Finally he saw Dr. Amalia Hailey who will follow him in for 2 months. At this point I do not think he felt that he needed a procedure on the right calcaneal bone. Dr. Juleen China is elected for broad-spectrum antibiotic The patient is still having pain in the right heel. He walks with a walker 2/3; wounds are generally smaller. He is tolerating his IV antibiotics. I believe this is vancomycin and ceftriaxone. We are still waiting for Oasis burn in terms of his out-of-pocket max which he should be meeting soon given the IV antibiotics, MRIs etc. I have  asked him to check in on this. We are using silver collagen in the meantime the wounds look better 2/10; tolerating IV vancomycin and Rocephin. We are waiting to apply for Oasis. Although I am not really sure where he is in his out-of-pocket max. 2/17 started the first application of Oasis trilayer. Still on antibiotics. The wounds have generally look better. The area on the left has a little more surface slough requiring debridement 9/45; second application of Oasis trilayer.  The wound surface granulation is generally look better. The area on the left with undermining laterally I think is come in a bit. 10/08/2020 upon evaluation today patient is here today for [employer] application #3. Fortunately he seems to be doing extremely well with regard to this and we are seeing a lot of new epithelial growth which is great news. Fortunately there is no signs of active infection at this time. 10/16/2020 upon evaluation today patient appears to be doing well with regard to his foot ulcers. Do believe the Oasis has been of benefit for him. I do not see any signs of infection right now which is great news and I think that he has a lot of new epithelial growth which is great to see as well. The patient is very pleased to hear all of this. I do think we can proceed with the Oasis trilayer #4 today. 3/18; not as much improvement in these areas on his heels that I was hoping. I did reapply trilateral Oasis today the tissue looks healthier but not as much fill in as I was hoping. 3/25; better looking today I think this is come in a bit the tissue looks healthier. Triple layer Oasis reapplied #6 4/1; somewhat better looking definitely better looking surface not as much change in surface area as I was hoping. He may be spending more time [place] on days then he needs to although he does have heel offloading boots. Triple layer Oasis reapplied #7 4/7; unfortunately apparently [facility] will not approve any further Oasis which is unfortunate since the patient did respond nicely both in terms of the condition of the wound bed as well as surface area. There is still some drainage coming from the wound but not a lot there does not appear to be any infection 4/15; we have been using Hydrofera Blue. He continues to have nice rims of epithelialization on the right greater than the left. The left the epithelialization is coming from the tip of his heel. There is moderate drainage. In  this that concerns me about a total contact cast. There is no evidence of infection 4/29; patient has been using Hydrofera Blue with dressing changes. He has no complaints or issues today. 5/5; using Hydrofera Blue. I actually think that he looks marginally better than the last time I saw this 3 weeks ago. There are rims of epithelialization on the left thumb coming from the medial side on the right. Using Hydrofera Blue 5/12; using Hydrofera Blue. These continue to make improvements in surface area. His drainage was not listed as severe I therefore went ahead and put a cast on the left foot. Right foot we will continue to dress his previous 5/16; back for first total contact cast change. He did not tolerate this particularly well cast injury on the anterior tibia among other issues. Difficulty sleeping. I talked him about this in some detail and afterwards is elected to continue. I told him I would like to have a cast on for  3 weeks to see if this is going to help at all. I think he agreed 5/19; I think the wound is better. There is no tunneling towards his midfoot. The undermining medially also looks better. He has a rim of new skin distally. I think we are making progress here. The area on the left also continues to look somewhat better to me using Hydrofera Blue. He has a list of complaints about the cast but none of them seem serious 5/26; patient presents for 1 week follow-up. He has been using a total contact cast and tolerating this well. Hydrofera Blue is the main dressing used. He denies signs of infection. 6/2 Hydrofera Blue total contact cast on the left. These were large ulcers that formed in intensive care unit where the patient was recovering from [place]. May have had something to do with being ventilated in an upright positiono Pressors etc. We have been able to get the areas down considerably and a viable surface. There is some epithelialization in both sides. Note made of  drainage 6/9; changed to [facility] last time because of drainage. He arrives with better looking surfaces and dimensions on the left than the right. Paradoxically the right actually probes more towards his midfoot the left is largely close down but both of these look improved. Using a total contact cast on the left 6/16; complex wounds on his bilateral plantar heels which were initially pressure injury from a stay in the ICU with COVID. We have been using silver alginate most recently. His dimensions of come in quite dramatically however not recently. We have been putting the left foot in a total contact cast 6/23; complex wounds on the bilateral plantar heels. I been putting the left in the cast paradoxically the area on the right is the one that is going towards closure at a faster rate. Quite a bit of drainage on the left. The patient went to see Dr. Amalia Hailey who said he was going to standby for skin grafts. I had actually considered sending him for skin grafts however he would be mandatorily off his feet for a period of weeks to months. I am thinking that the area on the right is going to close on its own the area on the left has been more stubborn even though we have him in a total contact cast 6/30; took him out of a total contact cast last week is the right heel seem to be making better progress than the left where I was placing the cast. We are using silver alginate. Both wounds are smaller right greater than left 7/12; both wounds look as though they are making some progress. We are using silver alginate. Heel offloading boots 7/26; very gradual progress especially on the right. Using silver alginate. He is wearing heel offloading boots 8/18; he continues to close these wounds down very gradually. Using silver alginate. The problem polymen being definitive about this is areas of what appears to be callus around the margins. This is not a 100% of the area but certainly sizable especially on  the right 9/1; bilateral plantar feet wounds secondary to prolonged pressure while being ventilated for COVID-19 in an upright position. Essentially pressure ulcers on the bottom of his feet. He is made substantial progress using silver alginate. 9/14; bilateral plantar feet wounds secondary to prolonged pressure. Making progress using silver alginate. 9/29 bilateral plantar feet wounds secondary to prolonged pressure. I changed him to Iodoflex last week. MolecuLight showing reddened blush fluorescence 10/11; patient  presents for follow-up. He has no issues or complaints today. He denies signs of infection. He continues to use Iodoflex and antibiotic ointment to the wound beds. 10/27; 2-week follow-up. No evidence of infection. He has callus and thick dry skin around the wound margins we have been using Iodoflex and Bactroban which was in response to a moderate left MolecuLight reddish blush fluorescence. 11/10; 2-week follow-up. Wound margins again have thick callus however the measurements of the actual wound sites are a lot smaller. Everything looks reasonably healthy here. We have been using Iodoflex He was approved for prime matrix but I have elected to delay this given the improvement in the surface area. Hopefully I will not regret that decision as were getting close to the end of the year in terms of insurance payment 12/8; 2-week follow-up. Wounds are generally smaller in size. These were initially substantial wounds extending into the forefoot all the way into the heel on the bilateral plantar feet. They are now both located on the plantar heel distal aspect both of these have a lot of callus around the wounds I used a #5 curette to remove this on the right and the left also some subcutaneous debris to try and get the wound edges were using Iodoflex. He has heel offloading shoe 12/22; 2-week follow-up. Not really much improvement. He has thick callus around the outer edges of both wounds. I  remove this there is some nonviable subcutaneous tissue as well. We have been using Iodoflex. Her intake nurse and myself spontaneously thought of a total contact cast I went back in May. At that time we really were not seeing much of an improvement with a cast although the wound was in a much different situation I would like to retry this in 2 weeks and I discussed this with the patient 08/12/2021; the patient has had some improvement with the Iodoflex. The the area on the left heel plantar more improved than the right. I had to put him in a total contact cast on the left although I decided to put that off for 2 weeks. I am going to change his primary dressing to silver collagen. I think in both areas he has had some improvement most of the healing seems to be more proximal in the heel. The wounds are in the mid aspect. A lot of thick callus on the right heel however. 1/19; we are using silver collagen on both plantar heel areas. He has had some improvement today. The left did not require any debridement. He still had some eschar on the right that was debrided but both seem to have contracted. I did not put it total contact cast on him today 2/2 we have been using silver collagen. The area on the right plantar heel has areas that appear to be epithelialized interspersed with dry flaking callus and dry skin. I removed this. This really looks better than on the other side. On the left still a large area with raised edges and debris on the surface. 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 He has no issues or complaints today. 2/9; not much change in the left foot wound with 1 week of a cast we are using silver collagen. Silver collagen on the right side. The right side has been the better wound surface. We will reapply the total contact cast on the left  2/16; not much  improvement on either side I been using silver collagen with a total contact cast on the left. I'm changing the Hydrofera Blue still with a total contact cast on the left Objective Constitutional Patient is hypertensive.. Pulse regular and within target range for patient.Marland Kitchen Respirations regular, non-labored and within target range.. Temperature is normal and within the target range for the patient.Marland Kitchen Appears in no distress. Vitals Time Taken: 3:35 PM, Height: 69 in, Weight: 280 lbs, BMI: 41.3, Temperature: 99.1 F, Pulse: 75 bpm, Respiratory Rate: 18 breaths/min, Blood Pressure: 150/83 mmHg. General Notes: we exam; left heel about the same as last time. Still raised edges callus even under cast. Surface of the wound does not look too bad. ooRight heel also about the same still some depth granulation looks reasonably healthy ooThere is absolutely real fat pad and I wonder whether this will ever function is proper weight-bearing surface ooNo evidence of infection Integumentary (Hair, Skin) Wound #1 status is Open. Original cause of wound was Pressure Injury. The date acquired was: 10/07/2019. The wound has been in treatment 62 weeks. The wound is located on the Right Calcaneus. The wound measures 2.4cm length x 1.2cm width x 0.2cm depth; 2.262cm^2 area and 0.452cm^3 volume. There is Fat Layer (Subcutaneous Tissue) exposed. There is no tunneling or undermining noted. There is a medium amount of serosanguineous drainage noted. There is large (67-100%) pink granulation within the wound bed. There is a small (1-33%) amount of necrotic tissue within the wound bed including Adherent Slough. Wound #2 status is Open. Original cause of wound was Pressure Injury. The date acquired was: 10/07/2019. The wound has been in treatment 62 weeks. The wound is located on the Left Calcaneus. The wound measures 2.3cm length x 0.5cm width x 0.2cm depth; 0.903cm^2 area and 0.181cm^3 volume. There is Fat Layer (Subcutaneous  Tissue) exposed. There is no tunneling or undermining noted. There is a medium amount of serosanguineous drainage noted. There is large (67-100%) pink granulation within the wound bed. There is a small (1-33%) amount of necrotic tissue within the wound bed including Adherent Slough. Wound #2 status is Open. Original cause of wound was Pressure Injury. The date acquired was: 10/07/2019. The wound has been in treatment 62 weeks. The wound is located on the Left Calcaneus. The wound measures 2.3cm length x 0.5cm width x 0.2cm depth; 0.903cm^2 area and 0.181cm^3 volume. There is a medium amount of serosanguineous drainage noted. 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 There was a T Contact Cast Procedure by Deno Etienne MD. Post procedure Diagnosis Wound #2: Same as Pre-Procedure Plan Follow-up Appointments: Return Appointment in 1 week. - Dr Dellia Nims Thursday room 1 Bathing/ Shower/ Hygiene: May shower with protection but do not get wound dressing(s) wet. - Cover with cast protector (can purchase cast protector at [facility] or [facility] ) Edema Control - Lymphedema / SCD / Other: Elevate legs to the level of the heart or above for 30 minutes daily and/or when sitting, a frequency of: - throughout the day Avoid standing for long periods of time. Moisturize legs daily. - right leg and foot every night. Off-Loading: T Contact Cast to Left Lower Extremity otal Wedge shoe to: - Glophed Shoe to right foot Other: - keep pressure  off of the bottom of your feet Additional Orders / Instructions: Follow Nutritious Diet WOUND #1: - Calcaneus Wound Laterality: Right Cleanser: Normal Saline (Generic) Every Other Day/30 Days Discharge  Instructions: Cleanse the wound with Normal Saline prior to applying a clean dressing using gauze sponges, not tissue or cotton balls. Cleanser: Wound Cleanser Every Other Day/30 Days Discharge Instructions: Cleanse the wound with wound cleanser prior to applying a clean dressing using gauze sponges, not tissue or cotton balls. Prim Dressing: Hydrofera Blue Classic Foam, 2x2 in Every Other Day/30 Days ary Discharge Instructions: Moisten with saline prior to applying to wound bed Secondary Dressing: Zetuvit Plus 4x8 in Every Other Day/30 Days Discharge Instructions: Apply over primary dressing as directed. Secured With: Elastic Bandage 4 inch (ACE bandage) (Generic) Every Other Day/30 Days Discharge Instructions: Secure with ACE bandage as directed. Secured With: [employer], 4.5x3.1 (in/yd) (Generic) Every Other Day/30 Days Discharge Instructions: Secure with Kerlix as directed. WOUND #2: - Calcaneus Wound Laterality: Left Cleanser: Normal Saline (Generic) 1 x Per Week/30 Days Discharge Instructions: Cleanse the wound with Normal Saline prior to applying a clean dressing using gauze sponges, not tissue or cotton balls. Cleanser: Wound Cleanser 1 x Per Week/30 Days Discharge Instructions: Cleanse the wound with wound cleanser prior to applying a clean dressing using gauze sponges, not tissue or cotton balls. Prim Dressing: Hydrofera Blue Classic Foam, 2x2 in 1 x Per Week/30 Days ary Discharge Instructions: Moisten with saline prior to applying to wound bed Secondary Dressing: Zetuvit Plus 4x8 in 1 x Per Week/30 Days Discharge Instructions: Apply over primary dressing as directed. Secured With: [employer], 4.5x3.1 (in/yd) (Generic) 1 x Per Week/30 Days Discharge Instructions: Secure with Kerlix as directed. Secured With: 60M Medipore H Soft Cloth Surgical T ape, 4 x 10 (in/yd) 1 x Per Week/30 Days Discharge Instructions: Secure with tape as directed. 1 I changed primary  dressing to Hydrofera Blue. Zetuvid #2 continue total contact cast on the left Electronic Signature(s) Signed: 09/23/2021 5:55:17 PM By: Linton Ham MD Entered By: Linton Ham on 09/23/2021 16:46:20 -------------------------------------------------------------------------------- Total Contact Cast Details Patient Name: Date of Service: [name], [name] 09/23/2021 3:15 PM Medical Record Number: [id number] Patient Account Number: [id number] Date of Birth/Sex: Treating RN: 05-28-1976 (46 y.o. [name] Primary Care Provider: [name] Other Clinician: Referring Provider: Treating Provider/Extender: [name] in Treatment: 47 T Contact Cast Applied for Wound Assessment: otal Wound #2 Left Calcaneus Performed By: Physician Ricard Dillon., MD Post Procedure Diagnosis Same as Pre-procedure Electronic Signature(s) Signed: 09/23/2021 5:55:17 PM By: Linton Ham MD Entered By: Linton Ham on 09/23/2021 16:41:12 -------------------------------------------------------------------------------- SuperBill Details Patient Name: Date of Service: [name], [place]. 09/23/2021 Medical Record Number: [id number] Patient Account Number: [id number] Date of Birth/Sex: Treating RN: 1976-01-13 (46 y.o. [name] Primary Care Provider: [name] Other Clinician: Referring Provider: Treating Provider/Extender: [name] in Treatment: 62 Diagnosis Coding ICD-10 Codes Code Description [phone] Non-pressure chronic ulcer of other part of right foot with fat layer exposed L97.522 Non-pressure chronic ulcer of other part of left foot with fat layer exposed Facility Procedures CPT4 Code: [id number] Description: [phone] - APPLY TOTAL CONTACT LEG CAST ICD-10 Diagnosis Description L97.522 Non-pressure chronic ulcer of other part of left foot with fat layer exposed Modifier: Quantity: 1 Physician Procedures : CPT4 Code  Description Modifier [id number] - WC PHYS APPLY TOTAL CONTACT CAST ICD-10 Diagnosis Description L97.522 Non-pressure chronic ulcer of other part  of left foot with fat layer exposed Quantity: 1 Electronic Signature(s) Signed: 09/23/2021 5:55:17 PM By: Linton Ham MD Entered By: Linton Ham on 09/23/2021 16:46:42

## 2021-09-23 NOTE — Progress Notes (Signed)
BRALON, ANTKOWIAK ([id number]) Visit Report for 09/23/2021 Arrival Information Details Patient Name: Date of Service: [name], [place] E. 09/23/2021 3:15 PM Medical Record Number: [id number] Patient Account Number: [id number] Date of Birth/Sex: Treating RN: 14-Sep-1975 (46 y.o. [name] Primary Care Troyce Gieske: [name] Other Clinician: Referring [place]: Treating Talal Fritchman/Extender: [name] in Treatment: 57 Visit Information History Since Last Visit Added or deleted any medications: No Patient Arrived: Wheel Chair Any new allergies or adverse reactions: No Arrival Time: 15:35 Had a fall or experienced change in No Accompanied By: mother activities of daily living that may affect Transfer Assistance: None risk of falls: Patient Identification Verified: Yes Signs or symptoms of abuse/neglect since last visito No Secondary Verification Process Completed: Yes Hospitalized since last visit: No Patient Requires Transmission-Based Precautions: No Has Dressing in Place as Prescribed: Yes Patient Has Alerts: No Has Compression in Place as Prescribed: Yes Pain Present Now: Yes Electronic Signature(s) Signed: 09/23/2021 5:23:57 PM By: Sharyn Creamer RN, BSN Entered By: Sharyn Creamer on 09/23/2021 15:36:25 -------------------------------------------------------------------------------- Encounter Discharge Information Details Patient Name: Date of Service: [name], [place]. 09/23/2021 3:15 PM Medical Record Number: [id number] Patient Account Number: [id number] Date of Birth/Sex: Treating RN: 1976/06/23 (46 y.o. [name] Primary Care Athziry Millican: [name] Other Clinician: Referring Olof Marcil: Treating Trinda Harlacher/Extender: [name] in Treatment: 53 Encounter Discharge Information Items Discharge Condition: Stable Ambulatory Status: Wheelchair Discharge Destination: Home Transportation: Private Auto Accompanied By:  mother Schedule Follow-up Appointment: Yes Clinical Summary of Care: Patient Declined Electronic Signature(s) Signed: 09/23/2021 5:28:56 PM By: Baruch Gouty RN, BSN Entered By: Baruch Gouty on 09/23/2021 16:32:02 -------------------------------------------------------------------------------- Lower Extremity Assessment Details Patient Name: Date of Service: [name], [place]. 09/23/2021 3:15 PM Medical Record Number: [id number] Patient Account Number: [id number] Date of Birth/Sex: Treating RN: Aug 12, 1975 (46 y.o. [name] Primary Care Eliyah Bazzi: [name] Other Clinician: Referring Hillary Schwegler: Treating Darelle Kings/Extender: [name] in Treatment: 62 Edema Assessment Assessed: [name]: No] [name]: No] Edema: [Left: No] [Right: No] Calf Left: Right: Point of Measurement: 29 cm From Medial Instep 41.4 cm 42.2 cm Ankle Left: Right: Point of Measurement: 9 cm From Medial Instep 25.1 cm 23.9 cm Vascular Assessment Pulses: Dorsalis Pedis Palpable: [Left:Yes] [Right:Yes] Electronic Signature(s) Signed: 09/23/2021 5:23:57 PM By: Sharyn Creamer RN, BSN Entered By: Sharyn Creamer on 09/23/2021 15:55:40 -------------------------------------------------------------------------------- Multi Wound Chart Details Patient Name: Date of Service: [name], [name] 09/23/2021 3:15 PM Medical Record Number: [id number] Patient Account Number: [id number] Date of Birth/Sex: Treating RN: 03-May-1976 (46 y.o. [name] Primary Care Jenica Costilow: [name] Other Clinician: Referring Akshara Blumenthal: Treating Judea Fennimore/Extender: [name] in Treatment: 75 Vital Signs Height(in): 48 Pulse(bpm): 54 Weight(lbs): 63 Blood Pressure(mmHg): 150/83 Body Mass Index(BMI): 41.3 Temperature(F): 99.1 Respiratory Rate(breaths/min): 18 Photos: [1:Right Calcaneus] [2:No Photos Left Calcaneus] Wound Location: [1:Pressure Injury] [2:Pressure  Injury] Wounding Event: [1:Pressure Ulcer] [2:Pressure Ulcer] Primary Etiology: [1:Asthma, Angina, Hypertension] [2:Asthma, Angina, Hypertension] Comorbid History: [1:10/07/2019] [2:10/07/2019] Date Acquired: [1:62] [2:62] Weeks of Treatment: [1:Open] [2:Open] Wound Status: [1:No] [2:No] Wound Recurrence: [1:2.4x1.2x0.2] [2:2.3x0.5x0.2] Measurements L x W x D (cm) [1:2.262] [2:0.903] A (cm) : rea [1:0.452] [2:0.181] Volume (cm) : [1:92.80%] [2:96.70%] % Reduction in A rea: [1:98.60%] [2:99.30%] % Reduction in Volume: [1:Category/Stage III] [2:Category/Stage III] Classification: [1:Medium] [2:Medium] Exudate A mount: [1:Serosanguineous] [2:Serosanguineous] Exudate Type: [1:red, brown] [2:red, brown] Exudate Color: [1:Large (67-100%)] [2:Large (67-100%)] Granulation A mount: [1:Pink] [2:Pink] Granulation Quality: [1:Small (1-33%)] [2:Small (1-33%)] Necrotic  A mount: [1:Fat Layer (Subcutaneous Tissue): Yes Fat Layer (Subcutaneous Tissue): Yes N/A] Exposed Structures: [1:Fascia: No Tendon: No Muscle: No Joint: No Bone: No Small (1-33%)] [2:Fascia: No Tendon: No Muscle: No Joint: No Bone: No Small (1-33%)] Epithelialization: [1:N/A] [2:T Contact Cast otal] Treatment Notes Wound #1 (Calcaneus) Wound Laterality: Right Cleanser Normal Saline Discharge Instruction: Cleanse the wound with Normal Saline prior to applying a clean dressing using gauze sponges, not tissue or cotton balls. Wound Cleanser Discharge Instruction: Cleanse the wound with wound cleanser prior to applying a clean dressing using gauze sponges, not tissue or cotton balls. Peri-Wound Care Topical Primary Dressing Hydrofera Blue Classic Foam, 2x2 in Discharge Instruction: Moisten with saline prior to applying to wound bed Secondary Dressing Zetuvit Plus 4x8 in Discharge Instruction: Apply over primary dressing as directed. Secured With Elastic Bandage 4 inch (ACE bandage) Discharge Instruction: Secure with ACE bandage  as directed. Kerlix Roll Sterile, 4.5x3.1 (in/yd) Discharge Instruction: Secure with Kerlix as directed. Compression Wrap Compression Stockings Add-Ons Wound #2 (Calcaneus) Wound Laterality: Left Cleanser Normal Saline Discharge Instruction: Cleanse the wound with Normal Saline prior to applying a clean dressing using gauze sponges, not tissue or cotton balls. Wound Cleanser Discharge Instruction: Cleanse the wound with wound cleanser prior to applying a clean dressing using gauze sponges, not tissue or cotton balls. Peri-Wound Care Topical Primary Dressing Hydrofera Blue Classic Foam, 2x2 in Discharge Instruction: Moisten with saline prior to applying to wound bed Secondary Dressing Zetuvit Plus 4x8 in Discharge Instruction: Apply over primary dressing as directed. Secured With [employer], 4.5x3.1 (in/yd) Discharge Instruction: Secure with Kerlix as directed. 73M Medipore H Soft Cloth Surgical T ape, 4 x 10 (in/yd) Discharge Instruction: Secure with tape as directed. Compression Wrap Compression Stockings Add-Ons Electronic Signature(s) Signed: 09/23/2021 5:28:56 PM By: Baruch Gouty RN, BSN Signed: 09/23/2021 5:55:17 PM By: Linton Ham MD Entered By: Linton Ham on 09/23/2021 16:40:52 -------------------------------------------------------------------------------- Multi-Disciplinary Care Plan Details Patient Name: Date of Service: [name], [place]. 09/23/2021 3:15 PM Medical Record Number: [id number] Patient Account Number: [id number] Date of Birth/Sex: Treating RN: 03/21/1976 (46 y.o. [name] Primary Care Anushree Dorsi: [name] Other Clinician: Referring Floella Ensz: Treating Burgess Sheriff/Extender: [name] in Treatment: 29 Multidisciplinary Care Plan reviewed with physician Active Inactive Abuse / Safety / Falls / Self Care Management Nursing Diagnoses: Potential for falls Goals: Patient/caregiver will  verbalize/demonstrate measures taken to prevent injury and/or falls Date Initiated: 07/15/2020 Target Resolution Date: 10/11/2021 Goal Status: Active Interventions: Assess fall risk on admission and as needed Assess impairment of mobility on admission and as needed per policy Notes: Wound/Skin Impairment Nursing Diagnoses: Impaired tissue integrity Knowledge deficit related to ulceration/compromised skin integrity Goals: Patient/caregiver will verbalize understanding of skin care regimen Date Initiated: 07/15/2020 Target Resolution Date: 10/11/2021 Goal Status: Active Ulcer/skin breakdown will have a volume reduction of 30% by week 4 Date Initiated: 07/15/2020 Date Inactivated: 08/20/2020 Target Resolution Date: 09/03/2020 Goal Status: Unmet Unmet Reason: no major changes. Ulcer/skin breakdown will heal within 14 weeks Date Initiated: 12/04/2020 Date Inactivated: 12/10/2020 Target Resolution Date: 12/10/2020 Unmet Reason: wounds still open at 14 Goal Status: Unmet weeks and today 21 weeks. Interventions: Assess patient/caregiver ability to obtain necessary supplies Assess patient/caregiver ability to perform ulcer/skin care regimen upon admission and as needed Assess ulceration(s) every visit Provide education on ulcer and skin care Treatment Activities: Skin care regimen initiated : 07/15/2020 Topical wound management initiated : 07/15/2020 Notes: Electronic Signature(s) Signed: 09/23/2021 5:28:56 PM By: Baruch Gouty RN,  BSN Entered By: Baruch Gouty on 09/23/2021 16:01:57 -------------------------------------------------------------------------------- Pain Assessment Details Patient Name: Date of Service: [name] [place] E. 09/23/2021 3:15 PM Medical Record Number: [id number] Patient Account Number: [id number] Date of Birth/Sex: Treating RN: 11-Oct-1975 (46 y.o. [name] Primary Care Saiquan Hands: [name] Other Clinician: Referring Jesaiah Fabiano: Treating Mashanda Ishibashi/Extender:  [name] in Treatment: 4 Active Problems Location of Pain Severity and Description of Pain Patient Has Paino Yes Site Locations Duration of the Pain. Constant / Intermittento Intermittent Rate the pain. Current Pain Level: 7 Worst Pain Level: 7 Least Pain Level: 3 Tolerable Pain Level: 3 Character of Pain Describe the Pain: Burning Pain Management and Medication Current Pain Management: Medication: Yes Rest: Yes How does your wound impact your activities of daily livingo Sleep: Yes Notes left foot in cast states pain along tips of toes Electronic Signature(s) Signed: 09/23/2021 5:23:57 PM By: Sharyn Creamer RN, BSN Entered By: Sharyn Creamer on 09/23/2021 15:38:31 -------------------------------------------------------------------------------- Patient/Caregiver Education Details Patient Name: Date of Service: Ciro Backer 2/16/2023andnbsp3:15 PM Medical Record Number: [id number] Patient Account Number: [id number] Date of Birth/Gender: Treating RN: 23-Sep-1975 (46 y.o. [name] Primary Care Physician: [name] Other Clinician: Referring Physician: Treating Physician/Extender: [name] in Treatment: 11 Education Assessment Education Provided To: Patient Education Topics Provided Offloading: Methods: Explain/Verbal Responses: Reinforcements needed, State content correctly Wound/Skin Impairment: Methods: Explain/Verbal Responses: Reinforcements needed, State content correctly Electronic Signature(s) Signed: 09/23/2021 5:28:56 PM By: Baruch Gouty RN, BSN Entered By: Baruch Gouty on 09/23/2021 16:02:51 -------------------------------------------------------------------------------- Wound Assessment Details Patient Name: Date of Service: [name], [place]. 09/23/2021 3:15 PM Medical Record Number: [id number] Patient Account Number: [id number] Date of Birth/Sex: Treating RN: 12/21/1975 (46 y.o.  [name] Primary Care Toshiko Kemler: [name] Other Clinician: Referring Blanca Carreon: Treating Diamante Truszkowski/Extender: [name] in Treatment: 62 Wound Status Wound Number: 1 Primary Etiology: Pressure Ulcer Wound Location: Right Calcaneus Wound Status: Open Wounding Event: Pressure Injury Comorbid History: Asthma, Angina, Hypertension Date Acquired: 10/07/2019 Weeks Of Treatment: 62 Clustered Wound: No Photos Wound Measurements Length: (cm) 2.4 Width: (cm) 1.2 Depth: (cm) 0.2 Area: (cm) 2.262 Volume: (cm) 0.452 % Reduction in Area: 92.8% % Reduction in Volume: 98.6% Epithelialization: Small (1-33%) Tunneling: No Undermining: No Wound Description Classification: Category/Stage III Exudate Amount: Medium Exudate Type: Serosanguineous Exudate Color: red, brown Foul Odor After Cleansing: No Slough/Fibrino Yes Wound Bed Granulation Amount: Large (67-100%) Exposed Structure Granulation Quality: Pink Fascia Exposed: No Necrotic Amount: Small (1-33%) Fat Layer (Subcutaneous Tissue) Exposed: Yes Necrotic Quality: Adherent Slough Tendon Exposed: No Muscle Exposed: No Joint Exposed: No Bone Exposed: No Treatment Notes Wound #1 (Calcaneus) Wound Laterality: Right Cleanser Normal Saline Discharge Instruction: Cleanse the wound with Normal Saline prior to applying a clean dressing using gauze sponges, not tissue or cotton balls. Wound Cleanser Discharge Instruction: Cleanse the wound with wound cleanser prior to applying a clean dressing using gauze sponges, not tissue or cotton balls. Peri-Wound Care Topical Primary Dressing Hydrofera Blue Classic Foam, 2x2 in Discharge Instruction: Moisten with saline prior to applying to wound bed Secondary Dressing Zetuvit Plus 4x8 in Discharge Instruction: Apply over primary dressing as directed. Secured With Elastic Bandage 4 inch (ACE bandage) Discharge Instruction: Secure with ACE bandage as  directed. Kerlix Roll Sterile, 4.5x3.1 (in/yd) Discharge Instruction: Secure with Kerlix as directed. Compression Wrap Compression Stockings Add-Ons Electronic Signature(s) Signed: 09/23/2021 5:23:57 PM By: Sharyn Creamer RN, BSN Entered By: Sharyn Creamer on 09/23/2021 15:53:41 --------------------------------------------------------------------------------  Wound Assessment Details Patient Name: Date of Service: [name] [place]. 09/23/2021 3:15 PM Medical Record Number: [id number] Patient Account Number: [id number] Date of Birth/Sex: Treating RN: 1976/08/04 (46 y.o. [name] Primary Care Asael Pann: [name] Other Clinician: Referring Jatavia Keltner: Treating Kwesi Sangha/Extender: [name] in Treatment: 62 Wound Status Wound Number: 2 Primary Etiology: Pressure Ulcer Wound Location: Left Calcaneus Wound Status: Open Wounding Event: Pressure Injury Comorbid History: Asthma, Angina, Hypertension Date Acquired: 10/07/2019 Weeks Of Treatment: 62 Clustered Wound: No Wound Measurements Length: (cm) 2.3 Width: (cm) 0.5 Depth: (cm) 0.2 Area: (cm) 0.903 Volume: (cm) 0.181 % Reduction in Area: 96.7% % Reduction in Volume: 99.3% Epithelialization: Small (1-33%) Tunneling: No Undermining: No Wound Description Classification: Category/Stage III Exudate Amount: Medium Exudate Type: Serosanguineous Exudate Color: red, brown Foul Odor After Cleansing: No Slough/Fibrino Yes Wound Bed Granulation Amount: Large (67-100%) Exposed Structure Granulation Quality: Pink Fascia Exposed: No Necrotic Amount: Small (1-33%) Fat Layer (Subcutaneous Tissue) Exposed: Yes Necrotic Quality: Adherent Slough Tendon Exposed: No Muscle Exposed: No Joint Exposed: No Bone Exposed: No Electronic Signature(s) Signed: 09/23/2021 5:23:57 PM By: Sharyn Creamer RN, BSN Entered By: Sharyn Creamer on 09/23/2021  15:53:50 -------------------------------------------------------------------------------- Wound Assessment Details Patient Name: Date of Service: [name], [name] 09/23/2021 3:15 PM Medical Record Number: [id number] Patient Account Number: [id number] Date of Birth/Sex: Treating RN: Dec 13, 1975 (46 y.o. [name] Primary Care Mahli Glahn: [name] Other Clinician: Referring Kohei Antonellis: Treating Dashley Monts/Extender: [name] in Treatment: 62 Wound Status Wound Number: 2 Primary Etiology: Pressure Ulcer Wound Location: Left Calcaneus Wound Status: Open Wounding Event: Pressure Injury Comorbid History: Asthma, Angina, Hypertension Date Acquired: 10/07/2019 Weeks Of Treatment: 62 Clustered Wound: No Photos Wound Measurements Length: (cm) 2.3 Width: (cm) 0.5 Depth: (cm) 0.2 Area: (cm) 0.903 Volume: (cm) 0.181 % Reduction in Area: 96.7% % Reduction in Volume: 99.3% Wound Description Classification: Category/Stage III Exudate Amount: Medium Exudate Type: Serosanguineous Exudate Color: red, brown Treatment Notes Wound #2 (Calcaneus) Wound Laterality: Left Cleanser Normal Saline Discharge Instruction: Cleanse the wound with Normal Saline prior to applying a clean dressing using gauze sponges, not tissue or cotton balls. Wound Cleanser Discharge Instruction: Cleanse the wound with wound cleanser prior to applying a clean dressing using gauze sponges, not tissue or cotton balls. Peri-Wound Care Topical Primary Dressing Hydrofera Blue Classic Foam, 2x2 in Discharge Instruction: Moisten with saline prior to applying to wound bed Secondary Dressing Zetuvit Plus 4x8 in Discharge Instruction: Apply over primary dressing as directed. Secured With [employer], 4.5x3.1 (in/yd) Discharge Instruction: Secure with Kerlix as directed. 43M Medipore H Soft Cloth Surgical T ape, 4 x 10 (in/yd) Discharge Instruction: Secure with tape as  directed. Compression Wrap Compression Stockings Add-Ons Electronic Signature(s) Signed: 09/23/2021 5:23:57 PM By: Sharyn Creamer RN, BSN Entered By: Sharyn Creamer on 09/23/2021 15:54:51 -------------------------------------------------------------------------------- [place] Details Patient Name: Date of Service: [name], [place] E. 09/23/2021 3:15 PM Medical Record Number: [id number] Patient Account Number: [id number] Date of Birth/Sex: Treating RN: Jan 18, 1976 (47 y.o. [name] Primary Care Gowri Suchan: [name] Other Clinician: Referring Merlyn Conley: Treating Therasa Lorenzi/Extender: [name] in Treatment: 62 Vital Signs Time Taken: 15:35 Temperature (F): 99.1 Height (in): 69 Pulse (bpm): 75 Weight (lbs): 280 Respiratory Rate (breaths/min): 18 Body Mass Index (BMI): 41.3 Blood Pressure (mmHg): 150/83 Reference Range: 80 - 120 mg / dl Electronic Signature(s) Signed: 09/23/2021 5:23:57 PM By: Sharyn Creamer RN, BSN Entered By: Sharyn Creamer on 09/23/2021 15:39:24

## 2021-09-28 ENCOUNTER — Other Ambulatory Visit: Payer: Self-pay

## 2021-09-28 ENCOUNTER — Encounter: Payer: Self-pay | Admitting: Allergy & Immunology

## 2021-09-28 ENCOUNTER — Ambulatory Visit (INDEPENDENT_AMBULATORY_CARE_PROVIDER_SITE_OTHER): Payer: BC Managed Care – PPO | Admitting: Allergy & Immunology

## 2021-09-28 VITALS — BP 142/92 | HR 75 | Temp 97.8°F | Resp 16 | Ht 68.0 in | Wt 300.0 lb

## 2021-09-28 DIAGNOSIS — J3089 Other allergic rhinitis: Secondary | ICD-10-CM

## 2021-09-28 DIAGNOSIS — J4540 Moderate persistent asthma, uncomplicated: Secondary | ICD-10-CM | POA: Diagnosis not present

## 2021-09-28 MED ORDER — ALBUTEROL SULFATE HFA 108 (90 BASE) MCG/ACT IN AERS: 2.0000 | INHALATION_SPRAY | RESPIRATORY_TRACT | 1 refills | Status: DC | PRN

## 2021-09-28 MED ORDER — BUDESONIDE-FORMOTEROL FUMARATE 160-4.5 MCG/ACT IN AERO: 2.0000 | INHALATION_SPRAY | Freq: Two times a day (BID) | RESPIRATORY_TRACT | 5 refills | Status: DC

## 2021-09-28 MED ORDER — MONTELUKAST SODIUM 10 MG PO TABS: 10.0000 mg | ORAL_TABLET | Freq: Every day | ORAL | 5 refills | Status: DC

## 2021-09-28 NOTE — Patient Instructions (Addendum)
1. Moderate persistent asthma, uncomplicated - Lung testing looked stable today.  - We are not going to make any changes today.  - Daily controller medication(s): Symbicort 160/4.71mg two puffs twice daily with spacer - Prior to physical activity: albuterol 2 puffs 10-15 minutes before physical activity. - Rescue medications: albuterol 4 puffs every 4-6 hours as needed - Asthma control goals:  * Full participation in all desired activities (may need albuterol before activity) * Albuterol use two time or less a week on average (not counting use with activity) * Cough interfering with sleep two time or less a month * Oral steroids no more than once a year * No hospitalizations  2. Allergic rhinitis - well controlled - Continue with Nasacort one spray per nostril daily. - Continue with Atrovent nasal spray one spray per nostril every 8 hours as needed for runny nose.  - Continue with cetirizine 175mdaily.  3. Return in about 6 months (around 03/28/2022).    Please inform usKoreaf any Emergency Department visits, hospitalizations, or changes in symptoms. Call usKoreaefore going to the ED for breathing or allergy symptoms since we might be able to fit you in for a sick visit. Feel free to contact usKoreanytime with any questions, problems, or concerns.  It was a pleasure to see you and your family again today!  Websites that have reliable patient information: 1. American Academy of Asthma, Allergy, and Immunology: www.aaaai.org 2. Food Allergy Research and Education (FARE): foodallergy.org 3. Mothers of Asthmatics: http://www.asthmacommunitynetwork.org 4. American College of Allergy, Asthma, and Immunology: www.acaai.org   COVID-19 Vaccine Information can be found at: [email or web address] questions related to vaccine distribution or appointments, please email vaccine@ [email or web address] or call 332183884349  We realize that you might be  concerned about having an allergic reaction to the COVID19 vaccines. To help with that concern, WE ARE OFFERING THE COVID19 VACCINES IN OUR OFFICE! Ask the front desk for dates!     Like usKorean FaNational Citynd Instagram for our latest updates!      A healthy democracy works best when ALNew York Life Insurancearticipate! Make sure you are registered to vote! If you have moved or changed any of your contact information, you will need to get this updated before voting!  In some cases, you MAY be able to register to vote online: [email or web address]

## 2021-09-28 NOTE — Progress Notes (Signed)
FOLLOW UP  Date of Service/Encounter:  09/28/21   Assessment:   Moderate persistent asthma, uncomplicated   Allergic rhinitis   Prolonged hospitalization for COVID19 requiring ECMO   Fully vaccinated against COVID19  Plan/Recommendations:   1. Moderate persistent asthma, uncomplicated - Lung testing looked stable today.  - We are not going to make any changes today.  - Daily controller medication(s): Symbicort 160/4.89mg two puffs twice daily with spacer - Prior to physical activity: albuterol 2 puffs 10-15 minutes before physical activity. - Rescue medications: albuterol 4 puffs every 4-6 hours as needed - Asthma control goals:  * Full participation in all desired activities (may need albuterol before activity) * Albuterol use two time or less a week on average (not counting use with activity) * Cough interfering with sleep two time or less a month * Oral steroids no more than once a year * No hospitalizations  2. Allergic rhinitis - well controlled - Continue with Nasacort one spray per nostril daily. - Continue with Atrovent nasal spray one spray per nostril every 8 hours as needed for runny nose.  - Continue with cetirizine 132mdaily.  3. Return in about 6 months (around 03/28/2022).   Subjective:   [name] MERICAs a [age].o. male presenting today for follow up of  Chief Complaint  Patient presents with   Asthma    Uses albuterol inhaler before bedtime every night     [name] RODRIQUESas a history of the following: Patient Active Problem List   Diagnosis Date Noted   Left foot drop 09/06/2021   Wheelchair dependence 06/04/2021   Encounter for therapeutic drug monitoring 09/29/2020   COVID-19 long hauler manifesting chronic dyspnea 09/15/2020   Morbid obesity with body mass index of 40.0-44.9 in adult ([facility]   Encounter for attention to tracheostomy ([facility]   Critical illness polyneuropathy ([facility]/03[facility]2021   Moderate protein-calorie  malnutrition ([facility]02/03/2021   Pyogenic inflammation of bone ([facility]02/09/2020   Long COVID 09/09/2020   Acute osteomyelitis ([facility]01/24/2022   MSSA (methicillin susceptible Staphylococcus aureus) infection 08/31/2020   Reactive depression 07/01/2020   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   Impaired sensation to light touch 04/15/2020   Diffuse alveolar damage ([facility]08/09/2019   Eschar of foot    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   History of COVID-19    Pressure injury of skin 08/22/2019   Need for emotional support    Chest tube in place    Personal history of ECMO    Advanced care planning/counseling discussion    Palliative care by specialist    Goals of care, counseling/discussion    Advanced directives, counseling/discussion    Subcutaneous crepitus    Thrombocytopenia (HCC)    Leukopenia    Fever    Gram positive bacterial infection    Septic arthritis of right acromioclavicular joint (HCO[facility]02/22/2019   Chronic left-sided low back pain with left-sided sciatica 09/19/2017   Epigastric pain    Essential hypertension 04/17/2017   Moderate persistent asthma 07/21/2015   Allergic rhinoconjunctivitis 07/21/2015   GERD (gastroesophageal reflux disease) 07/21/2015   Abnormal gait 11/12/2009   TARSAL TUNNEL SYNDROME, LEFT 10/08/2009   PES PLANUS 10/08/2009    History obtained from: chart review and patient.  [name] a [age].o. male presenting for a follow up visit.  He was last seen in May 2022.  At that time, we continue with Symbicort 160 mcg 2 puffs twice daily  as well as albuterol as needed.  For his allergic rhinitis, we continue with Nasacort and added Atrovent due to increased rhinorrhea.  We continued with cetirizine 10 mg daily.  Since the last, he has done well. He is on full permanent disability as of a couple of weeks ago. He did [employer] before this.  He does want to go back to work.  Asthma/Respiratory Symptom  History: He remains on the Symbicort 2 puffs twice daily. [name] He is on oxygen which he uses when he sleeps. He is at 2 LPM. He remains on Symbicort two puffs twice daily. He has not been on prednisone at all. He does not use his albuterol much at all. He uses two puffs at night as needed.  This combination seems to be working well.  He has not required any prednisone since we last saw him.  Allergic Rhinitis Symptom History: Allergy symptoms have been under better control. He remains on the cetirizine daily. He uses the nasal sprays as needed.  He has not been on antibiotics at all for his symptoms.   He has been having some pressure sores on his feet. He sees Wound Care.  He feels that these are getting better.  These are all, left over from his prolonged hospitalization in late 2020 and into early 2021.    Otherwise, there have been no changes to his past medical history, surgical history, family history, or social history.    Review of Systems  Constitutional: Negative.  Negative for chills, fever, malaise/fatigue and weight loss.  HENT: Negative.  Negative for congestion, ear discharge and ear pain.   Eyes:  Negative for pain, discharge and redness.  Respiratory:  Positive for shortness of breath. Negative for cough, sputum production and wheezing.   Cardiovascular: Negative.  Negative for chest pain and palpitations.  Gastrointestinal:  Negative for abdominal pain, constipation, diarrhea, heartburn, nausea and vomiting.  Skin: Negative.  Negative for itching and rash.  Neurological:  Negative for dizziness and headaches.  Endo/Heme/Allergies:  Positive for environmental allergies. Does not bruise/bleed easily.      Objective:   Blood pressure (!) 142/92, pulse 75, temperature 97.8 F (36.6 C), resp. rate 16, height 5' 8"  (1.727 m), weight 300 lb (136.1 kg), SpO2 95 %. Body mass index is 45.61 kg/m.    Physical Exam Vitals reviewed.  Constitutional:      Appearance: He is  well-developed.     Comments: Sitting in a chair, although he came in using his wheelchair.   HENT:     Head: Normocephalic and atraumatic.     Right Ear: Tympanic membrane, ear canal and external ear normal.     Left Ear: Tympanic membrane, ear canal and external ear normal.     Nose: No nasal deformity, septal deviation, mucosal edema or rhinorrhea.     Right Turbinates: Enlarged, swollen and pale.     Left Turbinates: Enlarged, swollen and pale.     Right Sinus: No maxillary sinus tenderness or frontal sinus tenderness.     Left Sinus: No maxillary sinus tenderness or frontal sinus tenderness.     Mouth/Throat:     Mouth: Mucous membranes are not pale and not dry.     Pharynx: Uvula midline.  Eyes:     General: Lids are normal. Allergic shiner present.        Right eye: No discharge.        Left eye: No discharge.     Conjunctiva/sclera: Conjunctivae normal.  Right eye: Right conjunctiva is not injected. No chemosis.    Left eye: Left conjunctiva is not injected. No chemosis.    Pupils: Pupils are equal, round, and reactive to light.  Cardiovascular:     Rate and Rhythm: Normal rate and regular rhythm.     Heart sounds: Normal heart sounds.  Pulmonary:     Effort: Pulmonary effort is normal. No tachypnea, accessory muscle usage or respiratory distress.     Breath sounds: Normal breath sounds. No wheezing, rhonchi or rales.     Comments: Coarse airway sounds throughout. Talkative and interactive.  Chest:     Chest wall: No tenderness.  Lymphadenopathy:     Cervical: No cervical adenopathy.  Skin:    General: Skin is warm.     Capillary Refill: Capillary refill takes less than 2 seconds.     Coloration: Skin is not pale.     Findings: Wound present. No abrasion, erythema, petechiae or rash. Rash is not papular, urticarial or vesicular.     Comments: No eczematous or urticarial lesions noted. He does have dressing in place on his feet secondary to pressure ulcers which are  healing albeit slowly.   Neurological:     Mental Status: He is alert.  Psychiatric:        Behavior: Behavior is cooperative.     Diagnostic studies:    Spirometry: results abnormal (FEV1: 2.19/58%, FVC: 2.66/55%, FEV1/FVC: 82%).    Spirometry consistent with possible restrictive disease.   Allergy Studies: none      Salvatore Marvel, MD  [facility]

## 2021-09-30 ENCOUNTER — Other Ambulatory Visit: Payer: Self-pay

## 2021-09-30 ENCOUNTER — Encounter (HOSPITAL_BASED_OUTPATIENT_CLINIC_OR_DEPARTMENT_OTHER): Payer: BC Managed Care – PPO | Admitting: Internal Medicine

## 2021-09-30 DIAGNOSIS — L97522 Non-pressure chronic ulcer of other part of left foot with fat layer exposed: Secondary | ICD-10-CM | POA: Diagnosis not present

## 2021-09-30 DIAGNOSIS — I10 Essential (primary) hypertension: Secondary | ICD-10-CM | POA: Diagnosis not present

## 2021-09-30 DIAGNOSIS — L89623 Pressure ulcer of left heel, stage 3: Secondary | ICD-10-CM | POA: Diagnosis not present

## 2021-09-30 DIAGNOSIS — L89613 Pressure ulcer of right heel, stage 3: Secondary | ICD-10-CM | POA: Diagnosis not present

## 2021-09-30 DIAGNOSIS — Z8616 Personal history of COVID-19: Secondary | ICD-10-CM | POA: Diagnosis not present

## 2021-09-30 DIAGNOSIS — L97512 Non-pressure chronic ulcer of other part of right foot with fat layer exposed: Secondary | ICD-10-CM | POA: Diagnosis not present

## 2021-09-30 NOTE — Progress Notes (Addendum)
[name] ([id number]) Visit Report for 09/30/2021 Debridement Details Patient Name: Date of Service: [name], [place] 09/30/2021 9:45 A M Medical Record Number: [id number] Patient Account Number: [id number] Date of Birth/Sex: Treating RN: 01-15-1976 (46 y.o. [name] Primary Care Provider: [name] Other Clinician: Referring Provider: Treating Provider/Extender: [name] in Treatment: 63 Debridement Performed for Assessment: Wound #1 Right Calcaneus Performed By: Physician Ricard Dillon., MD Debridement Type: Debridement Level of Consciousness (Pre-procedure): Awake and Alert Pre-procedure Verification/Time Out Yes - 10:17 Taken: Start Time: 10:17 Pain Control: Lidocaine 4% T opical Solution T Area Debrided (L x W): otal 2 (cm) x 1.1 (cm) = 2.2 (cm) Tissue and other material debrided: Non-Viable, Callus, Slough, Subcutaneous, Slough Level: Skin/Subcutaneous Tissue Debridement Description: Excisional Instrument: Curette Bleeding: Minimum Hemostasis Achieved: Pressure End Time: 10:19 Procedural Pain: 1 Post Procedural Pain: 1 Response to Treatment: Procedure was tolerated well Level of Consciousness (Post- Awake and Alert procedure): Post Debridement Measurements of Total Wound Length: (cm) 2 Stage: Category/Stage III Width: (cm) 1.1 Depth: (cm) 0.2 Volume: (cm) 0.346 Character of Wound/Ulcer Post Debridement: Improved Post Procedure Diagnosis Same as Pre-procedure Electronic Signature(s) Signed: 09/30/2021 11:30:18 AM By: [name] Signed: 09/30/2021 5:03:55 PM By: Linton Ham MD Entered By: [name] on 09/30/2021 10:29:59 -------------------------------------------------------------------------------- Debridement Details Patient Name: Date of Service: [name], [name] 09/30/2021 9:45 A M Medical Record Number: [id number] Patient Account Number: [id number] Date of Birth/Sex: Treating RN: [birth year]  (46 y.o. [name] Primary Care Provider: [name] Other Clinician: Referring Provider: Treating Provider/Extender: [name] in Treatment: 63 Debridement Performed for Assessment: Wound #2 Left Calcaneus Performed By: Physician Ricard Dillon., MD Debridement Type: Debridement Level of Consciousness (Pre-procedure): Awake and Alert Pre-procedure Verification/Time Out Yes - 10:17 Taken: Start Time: 10:17 Pain Control: Lidocaine 4% T opical Solution T Area Debrided (L x W): otal 3 (cm) x 0.5 (cm) = 1.5 (cm) Tissue and other material debrided: Non-Viable, Callus, Slough, Subcutaneous, Slough Level: Skin/Subcutaneous Tissue Debridement Description: Excisional Instrument: Curette Bleeding: Minimum Hemostasis Achieved: Pressure End Time: 10:19 Procedural Pain: 1 Post Procedural Pain: 1 Response to Treatment: Procedure was tolerated well Level of Consciousness (Post- Awake and Alert procedure): Post Debridement Measurements of Total Wound Length: (cm) 3 Stage: Category/Stage III Width: (cm) 0.5 Depth: (cm) 0.2 Volume: (cm) 0.236 Character of Wound/Ulcer Post Debridement: Improved Post Procedure Diagnosis Same as Pre-procedure Electronic Signature(s) Signed: 09/30/2021 11:30:18 AM By: [name] Signed: 09/30/2021 5:03:55 PM By: Linton Ham MD Entered By: [name] on 09/30/2021 10:31:28 -------------------------------------------------------------------------------- HPI Details Patient Name: Date of Service: [name], [name] 09/30/2021 9:45 A M Medical Record Number: [id number] Patient Account Number: [id number] Date of Birth/Sex: Treating RN: September 22, 1975 (46 y.o. M) Primary Care Provider: [name] Other Clinician: Referring Provider: Treating Provider/Extender: [name] in Treatment: 71 History of Present Illness HPI Description: Wounds are12/03/2020 upon evaluation today patient  presents for initial inspection here in our clinic concerning issues he has been having with the bottoms of his feet bilaterally. He states these actually occurred as wounds when he was hospitalized for 5 months secondary to Covid. He was apparently with tilting bed where he was in an upright position quite frequently and apparently this occurred in some way shape or form during that time. Fortunately there is no sign of active infection at this time. No fevers, chills, nausea, vomiting, or diarrhea. With that being said he still  has substantial wounds on the plantar aspects of his feet Theragen require quite a bit of work to get these to heal. He has been using Santyl currently though that is been problematic both in receiving the medication as well as actually paid for it as it is become quite expensive. Prior to the experience with Covid the patient really did not have any major medical problems other than hypertension he does have some mild generalized weakness following the Covid experience. 07/22/2020 on evaluation today patient appears to be doing okay in regard to his foot ulcers I feel like the wound beds are showing signs of better improvement that I do believe the Iodoflex is helping in this regard. With that being said he does have a lot of drainage currently and this is somewhat blue/green in nature which is consistent with Pseudomonas. I do think a culture today would be appropriate for [place] to evaluate and see if that is indeed the case I would likely start him on antibiotic orally as well he is not allergic to Cipro knows of no issues he has had in the past 12/21; patient was admitted to the clinic earlier this month with bilateral presumed pressure ulcers on the bottom of his feet apparently related to excessive pressure from a tilt table arrangement in the intensive care unit. Patient relates this to being on ECMO but I am not really sure that is exactly related to that. I must say I  have never seen anything like this. He has fairly extensive full-thickness wounds extending from his heel towards his midfoot mostly centered laterally. There is already been some healing distally. He does not appear to have an arterial issue. He has been using gentamicin to the wound surfaces with Iodoflex to help with ongoing debridement 1/6; this is a patient with pressure ulcers on the bottom of his feet related to excessive pressure from a standing position in the intensive care unit. He is complaining of a lot of pain in the right heel. He is not a diabetic. 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 We have been using Iodoflex to help prepare the surfaces of both wounds for an advanced treatment product. He is nonambulatory spending most of his time in a wheelchair I have asked him not to propel the wheelchair with his heels 1/13; in general his wounds look better not much surface area change we have been using Iodoflex as of last week. I did an x-ray of the right heel as the patient was complaining of pain. I had some thoughts about a stress fracture perhaps Achilles tendon problems however what it showed was erosive changes along the inferior aspect of the calcaneus he now has a MRI booked for 1/20. 1/20; in general his wounds continue to be better. Some improvement in the large narrow areas proximally in his foot. He is still complaining of pain in the right heel and tenderness in certain areas of this wound. His MRI is tonight. I am not just looking for osteomyelitis that was brought up on the x-ray I am wondering about stress fractures, tendon ruptures etc. He has no such findings on the left. Also noteworthy is that the patient had critical illness neuropathy and some of the discomfort may be actual improvement in nerve function I am just not sure. These wounds were initially in the setting of severe critical illness related to COVID-19. He was put in a standing  position. He may have also been on pressors at  the point contributing to tissue ischemia. By his description at some point these wounds were grossly necrotic extending proximally up into the Achilles part of his heel. I do not know that I have ever really seen pictures of them like this although they may exist in epic We have ordered Tri layer Oasis. I am trying to stimulate some granulation in these areas. This is of course assuming the MRI is negative for infection 1/27; since the patient was last here he saw Dr. Juleen China of infectious disease. He is planned for vancomycin and ceftriaxone. Prior operative culture grew MSSA. Also ordered baseline lab work. He also ordered arterial studies although the ABIs in our clinic were normal as well as his clinical exam these were normal I do not think he needs to see vascular surgery. His ABIs at the [facility] were 1.22 in the right triphasic waveforms with a normal TBI of 1.15 on the left ABI of 1.22 with triphasic waveforms and a normal TBI of 1.08. Finally he saw Dr. Amalia Hailey who will follow him in for 2 months. At this point I do not think he felt that he needed a procedure on the right calcaneal bone. Dr. Juleen China is elected for broad-spectrum antibiotic The patient is still having pain in the right heel. He walks with a walker 2/3; wounds are generally smaller. He is tolerating his IV antibiotics. I believe this is vancomycin and ceftriaxone. We are still waiting for Oasis burn in terms of his out-of-pocket max which he should be meeting soon given the IV antibiotics, MRIs etc. I have asked him to check in on this. We are using silver collagen in the meantime the wounds look better 2/10; tolerating IV vancomycin and Rocephin. We are waiting to apply for Oasis. Although I am not really sure where he is in his out-of-pocket max. 2/17 started the first application of Oasis trilayer. Still on antibiotics. The wounds have generally look better. The area on the left  has a little more surface slough requiring debridement 5/68; second application of Oasis trilayer. The wound surface granulation is generally look better. The area on the left with undermining laterally I think is come in a bit. 10/08/2020 upon evaluation today patient is here today for [employer] application #3. Fortunately he seems to be doing extremely well with regard to this and we are seeing a lot of new epithelial growth which is great news. Fortunately there is no signs of active infection at this time. 10/16/2020 upon evaluation today patient appears to be doing well with regard to his foot ulcers. Do believe the Oasis has been of benefit for him. I do not see any signs of infection right now which is great news and I think that he has a lot of new epithelial growth which is great to see as well. The patient is very pleased to hear all of this. I do think we can proceed with the Oasis trilayer #4 today. 3/18; not as much improvement in these areas on his heels that I was hoping. I did reapply trilateral Oasis today the tissue looks healthier but not as much fill in as I was hoping. 3/25; better looking today I think this is come in a bit the tissue looks healthier. Triple layer Oasis reapplied #6 4/1; somewhat better looking definitely better looking surface not as much change in surface area as I was hoping. He may be spending more time [place] on days then he needs to although he does have heel offloading  boots. Triple layer Oasis reapplied #7 4/7; unfortunately apparently [facility] will not approve any further Oasis which is unfortunate since the patient did respond nicely both in terms of the condition of the wound bed as well as surface area. There is still some drainage coming from the wound but not a lot there does not appear to be any infection 4/15; we have been using Hydrofera Blue. He continues to have nice rims of epithelialization on the right greater than the left.  The left the epithelialization is coming from the tip of his heel. There is moderate drainage. In this that concerns me about a total contact cast. There is no evidence of infection 4/29; patient has been using Hydrofera Blue with dressing changes. He has no complaints or issues today. 5/5; using Hydrofera Blue. I actually think that he looks marginally better than the last time I saw this 3 weeks ago. There are rims of epithelialization on the left thumb coming from the medial side on the right. Using Hydrofera Blue 5/12; using Hydrofera Blue. These continue to make improvements in surface area. His drainage was not listed as severe I therefore went ahead and put a cast on the left foot. Right foot we will continue to dress his previous 5/16; back for first total contact cast change. He did not tolerate this particularly well cast injury on the anterior tibia among other issues. Difficulty sleeping. I talked him about this in some detail and afterwards is elected to continue. I told him I would like to have a cast on for 3 weeks to see if this is going to help at all. I think he agreed 5/19; I think the wound is better. There is no tunneling towards his midfoot. The undermining medially also looks better. He has a rim of new skin distally. I think we are making progress here. The area on the left also continues to look somewhat better to me using Hydrofera Blue. He has a list of complaints about the cast but none of them seem serious 5/26; patient presents for 1 week follow-up. He has been using a total contact cast and tolerating this well. Hydrofera Blue is the main dressing used. He denies signs of infection. 6/2 Hydrofera Blue total contact cast on the left. These were large ulcers that formed in intensive care unit where the patient was recovering from [place]. May have had something to do with being ventilated in an upright positiono Pressors etc. We have been able to get the areas down  considerably and a viable surface. There is some epithelialization in both sides. Note made of drainage 6/9; changed to [facility] last time because of drainage. He arrives with better looking surfaces and dimensions on the left than the right. Paradoxically the right actually probes more towards his midfoot the left is largely close down but both of these look improved. Using a total contact cast on the left 6/16; complex wounds on his bilateral plantar heels which were initially pressure injury from a stay in the ICU with COVID. We have been using silver alginate most recently. His dimensions of come in quite dramatically however not recently. We have been putting the left foot in a total contact cast 6/23; complex wounds on the bilateral plantar heels. I been putting the left in the cast paradoxically the area on the right is the one that is going towards closure at a faster rate. Quite a bit of drainage on the left. The patient went to  see Dr. Amalia Hailey who said he was going to standby for skin grafts. I had actually considered sending him for skin grafts however he would be mandatorily off his feet for a period of weeks to months. I am thinking that the area on the right is going to close on its own the area on the left has been more stubborn even though we have him in a total contact cast 6/30; took him out of a total contact cast last week is the right heel seem to be making better progress than the left where I was placing the cast. We are using silver alginate. Both wounds are smaller right greater than left 7/12; both wounds look as though they are making some progress. We are using silver alginate. Heel offloading boots 7/26; very gradual progress especially on the right. Using silver alginate. He is wearing heel offloading boots 8/18; he continues to close these wounds down very gradually. Using silver alginate. The problem polymen being definitive about this is areas of what appears to  be callus around the margins. This is not a 100% of the area but certainly sizable especially on the right 9/1; bilateral plantar feet wounds secondary to prolonged pressure while being ventilated for COVID-19 in an upright position. Essentially pressure ulcers on the bottom of his feet. He is made substantial progress using silver alginate. 9/14; bilateral plantar feet wounds secondary to prolonged pressure. Making progress using silver alginate. 9/29 bilateral plantar feet wounds secondary to prolonged pressure. I changed him to Iodoflex last week. MolecuLight showing reddened blush fluorescence 10/11; patient presents for follow-up. He has no issues or complaints today. He denies signs of infection. He continues to use Iodoflex and antibiotic ointment to the wound beds. 10/27; 2-week follow-up. No evidence of infection. He has callus and thick dry skin around the wound margins we have been using Iodoflex and Bactroban which was in response to a moderate left MolecuLight reddish blush fluorescence. 11/10; 2-week follow-up. Wound margins again have thick callus however the measurements of the actual wound sites are a lot smaller. Everything looks reasonably healthy here. We have been using Iodoflex He was approved for prime matrix but I have elected to delay this given the improvement in the surface area. Hopefully I will not regret that decision as were getting close to the end of the year in terms of insurance payment 12/8; 2-week follow-up. Wounds are generally smaller in size. These were initially substantial wounds extending into the forefoot all the way into the heel on the bilateral plantar feet. They are now both located on the plantar heel distal aspect both of these have a lot of callus around the wounds I used a #5 curette to remove this on the right and the left also some subcutaneous debris to try and get the wound edges were using Iodoflex. He has heel offloading shoe 12/22; 2-week  follow-up. Not really much improvement. He has thick callus around the outer edges of both wounds. I remove this there is some nonviable subcutaneous tissue as well. We have been using Iodoflex. Her intake nurse and myself spontaneously thought of a total contact cast I went back in May. At that time we really were not seeing much of an improvement with a cast although the wound was in a much different situation I would like to retry this in 2 weeks and I discussed this with the patient 08/12/2021; the patient has had some improvement with the Iodoflex. The the area on the left heel  plantar more improved than the right. I had to put him in a total contact cast on the left although I decided to put that off for 2 weeks. I am going to change his primary dressing to silver collagen. I think in both areas he has had some improvement most of the healing seems to be more proximal in the heel. The wounds are in the mid aspect. A lot of thick callus on the right heel however. 1/19; we are using silver collagen on both plantar heel areas. He has had some improvement today. The left did not require any debridement. He still had some eschar on the right that was debrided but both seem to have contracted. I did not put it total contact cast on him today 2/2 we have been using silver collagen. The area on the right plantar heel has areas that appear to be epithelialized interspersed with dry flaking callus and dry skin. I removed this. This really looks better than on the other side. On the left still a large area with raised edges and debris on the surface. 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 He has no issues or complaints today. 2/9; not much change in the left foot wound with 1 week of a cast we are using silver collagen. Silver collagen on the right side. The right side has  been the better wound surface. We will reapply the total contact cast on the left 2/16; not much improvement on either side I been using silver collagen with a total contact cast on the left. I'm changing the Hydrofera Blue still with a total contact cast on the left 2/23; some improvement on both sides. Disappointing that he has thick callus around the area that we are putting in a total contact cast on the left. We've been using Hydrofera Blue on both wound areas. This is a man who at essentially pressure ulcers in addition to ischemia caused by medications to support his blood pressure (pressors) in the ICU. He was being ventilated in the standing position for severe Covid. A [place] the wounds extended across his entire foot but are now localized to his plantar heels bilaterally. We have made progress however neither areas healed. I continue to think the total contact cast is helped albeit painstakingly slowly. He has never wanted a plastic surgery consult although I don't know that they would be interested in grafting in area in this location. Electronic Signature(s) Signed: 09/30/2021 5:03:55 PM By: Linton Ham MD Entered By: Linton Ham on 09/30/2021 10:32:06 -------------------------------------------------------------------------------- Physical Exam Details Patient Name: Date of Service: [name], [place] 09/30/2021 9:45 A M Medical Record Number: [id number] Patient Account Number: [id number] Date of Birth/Sex: Treating RN: [birth year] (46 y.o. M) Primary Care Provider: [name] Other Clinician: Referring Provider: Treating Provider/Extender: [name] in Treatment: 22 Constitutional Sitting or standing Blood Pressure is within target range for patient.. Pulse regular and within target range for patient.Marland Kitchen Respirations regular, non-labored and within target range.. Temperature is normal and within the target range for the patient.Marland Kitchen Appears in no  distress. Notes would examleft foot is in a total contact cast. Still a linear area that is narrow. Surrounding skin and callus removed with a #5 curet. On the right side is similarappearance we've been using Mudlogger) Signed: 09/30/2021 5:03:55 PM By: Linton Ham  MD Entered By: Linton Ham on 09/30/2021 10:33:43 -------------------------------------------------------------------------------- Physician Orders Details Patient Name: Date of Service: [name], [place]. 09/30/2021 9:45 A M Medical Record Number: [id number] Patient Account Number: [id number] Date of Birth/Sex: Treating RN: 04-May-1976 (46 y.o. [name] Primary Care Provider: [name] Other Clinician: Referring Provider: Treating Provider/Extender: [name] in Treatment: 28 Verbal / Phone Orders: No Diagnosis Coding ICD-10 Coding Code Description [phone] Non-pressure chronic ulcer of other part of right foot with fat layer exposed L97.522 Non-pressure chronic ulcer of other part of left foot with fat layer exposed Follow-up Appointments ppointment in 1 week. - Dr Lavere Stork/.Dr [name] Thursday room 1 Return A Bathing/ Shower/ Hygiene May shower with protection but do not get wound dressing(s) wet. - Cover with cast protector (can purchase cast protector at [facility] or [facility] ) Edema Control - Lymphedema / SCD / Other Bilateral Lower Extremities Elevate legs to the level of the heart or above for 30 minutes daily and/or when sitting, a frequency of: - throughout the day Avoid standing for long periods of time. Moisturize legs daily. - right leg and foot every night. Off-Loading Total Contact Cast to Left Lower Extremity Wedge shoe to: - Glophed Shoe to right foot Other: - keep pressure off of the bottom of your feet Additional Orders / Instructions Follow Nutritious Diet Wound Treatment Wound #1 - Calcaneus Wound Laterality: Right Cleanser: Normal  Saline (Generic) Every Other Day/30 Days Discharge Instructions: Cleanse the wound with Normal Saline prior to applying a clean dressing using gauze sponges, not tissue or cotton balls. Cleanser: Wound Cleanser Every Other Day/30 Days Discharge Instructions: Cleanse the wound with wound cleanser prior to applying a clean dressing using gauze sponges, not tissue or cotton balls. Prim Dressing: Hydrofera Blue Classic Foam, 2x2 in Every Other Day/30 Days ary Discharge Instructions: Moisten with saline prior to applying to wound bed Secondary Dressing: Zetuvit Plus 4x8 in (DME) (Generic) Every Other Day/30 Days Discharge Instructions: Apply over primary dressing as directed. Secured With: Elastic Bandage 4 inch (ACE bandage) (Generic) Every Other Day/30 Days Discharge Instructions: Secure with ACE bandage as directed. Secured With: [employer] [facility] [employer], 4.5x3.1 (in/yd) (Generic) Every Other Day/30 Days Discharge Instructions: Secure with Kerlix as directed. Wound #2 - Calcaneus Wound Laterality: Left Cleanser: Normal Saline (Generic) 1 x Per Week/30 Days Discharge Instructions: Cleanse the wound with Normal Saline prior to applying a clean dressing using gauze sponges, not tissue or cotton balls. Cleanser: Wound Cleanser 1 x Per Week/30 Days Discharge Instructions: Cleanse the wound with wound cleanser prior to applying a clean dressing using gauze sponges, not tissue or cotton balls. Prim Dressing: Hydrofera Blue Classic Foam, 2x2 in 1 x Per Week/30 Days ary Discharge Instructions: Moisten with saline prior to applying to wound bed Secondary Dressing: Zetuvit Plus 4x8 in (DME) (Generic) 1 x Per Week/30 Days Discharge Instructions: Apply over primary dressing as directed. Secured With: [employer], 4.5x3.1 (in/yd) (Generic) 1 x Per Week/30 Days Discharge Instructions: Secure with Kerlix as directed. Secured With: 57M Medipore H Soft Cloth Surgical T ape, 4 x 10 (in/yd) 1 x Per Week/30  Days Discharge Instructions: Secure with tape as directed. Electronic Signature(s) Signed: 10/06/2021 6:00:36 PM By: [name] Signed: 10/15/2021 12:53:18 PM By: Linton Ham MD Previous Signature: 09/30/2021 11:30:18 AM Version By: [name] Previous Signature: 09/30/2021 5:03:55 PM Version By: Linton Ham MD Entered By: [name] on 10/06/2021 17:48:51 -------------------------------------------------------------------------------- Problem List Details Patient Name: Date of  Service: [name], [name] 09/30/2021 9:45 A M Medical Record Number: [id number] Patient Account Number: [id number] Date of Birth/Sex: Treating RN: 10-Nov-1975 (46 y.o. M) Primary Care Provider: [name] Other Clinician: Referring Provider: Treating Provider/Extender: [name] in Treatment: 18 Active Problems ICD-10 Encounter Code Description Active Date MDM Diagnosis L97.512 Non-pressure chronic ulcer of other part of right foot with fat layer exposed 09/03/2020 No Yes L97.522 Non-pressure chronic ulcer of other part of left foot with fat layer exposed 09/03/2020 No Yes Inactive Problems ICD-10 Code Description Active Date Inactive Date L89.893 Pressure ulcer of other site, stage 3 07/15/2020 07/15/2020 M62.81 Muscle weakness (generalized) 07/15/2020 07/15/2020 I10 Essential (primary) hypertension 07/15/2020 07/15/2020 M86.171 Other acute osteomyelitis, right ankle and foot 09/03/2020 09/03/2020 Resolved Problems Electronic Signature(s) Signed: 09/30/2021 5:03:55 PM By: Linton Ham MD Entered By: Linton Ham on 09/30/2021 10:27:59 -------------------------------------------------------------------------------- Progress Note Details Patient Name: Date of Service: [name], [place]. 09/30/2021 9:45 A M Medical Record Number: [id number] Patient Account Number: [id number] Date of Birth/Sex: Treating RN: 1976/05/12 (46 y.o. M) Primary Care Provider: [name] Other Clinician: Referring Provider: Treating Provider/Extender: [name] in Treatment: 3 Subjective History of Present Illness (HPI) Wounds are12/03/2020 upon evaluation today patient presents for initial inspection here in our clinic concerning issues he has been having with the bottoms of his feet bilaterally. He states these actually occurred as wounds when he was hospitalized for 5 months secondary to Covid. He was apparently with tilting bed where he was in an upright position quite frequently and apparently this occurred in some way shape or form during that time. Fortunately there is no sign of active infection at this time. No fevers, chills, nausea, vomiting, or diarrhea. With that being said he still has substantial wounds on the plantar aspects of his feet Theragen require quite a bit of work to get these to heal. He has been using Santyl currently though that is been problematic both in receiving the medication as well as actually paid for it as it is become quite expensive. Prior to the experience with Covid the patient really did not have any major medical problems other than hypertension he does have some mild generalized weakness following the Covid experience. 07/22/2020 on evaluation today patient appears to be doing okay in regard to his foot ulcers I feel like the wound beds are showing signs of better improvement that I do believe the Iodoflex is helping in this regard. With that being said he does have a lot of drainage currently and this is somewhat blue/green in nature which is consistent with Pseudomonas. I do think a culture today would be appropriate for [place] to evaluate and see if that is indeed the case I would likely start him on antibiotic orally as well he is not allergic to Cipro knows of no issues he has had in the past 12/21; patient was admitted to the clinic earlier this month with bilateral presumed pressure ulcers on the bottom of  his feet apparently related to excessive pressure from a tilt table arrangement in the intensive care unit. Patient relates this to being on ECMO but I am not really sure that is exactly related to that. I must say I have never seen anything like this. He has fairly extensive full-thickness wounds extending from his heel towards his midfoot mostly centered laterally. There is already been some healing distally. He does not appear to have an arterial issue. He has been using  gentamicin to the wound surfaces with Iodoflex to help with ongoing debridement 1/6; this is a patient with pressure ulcers on the bottom of his feet related to excessive pressure from a standing position in the intensive care unit. He is complaining of a lot of pain in the right heel. He is not a diabetic. 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 We have been using Iodoflex to help prepare the surfaces of both wounds for an advanced treatment product. He is nonambulatory spending most of his time in a wheelchair I have asked him not to propel the wheelchair with his heels 1/13; in general his wounds look better not much surface area change we have been using Iodoflex as of last week. I did an x-ray of the right heel as the patient was complaining of pain. I had some thoughts about a stress fracture perhaps Achilles tendon problems however what it showed was erosive changes along the inferior aspect of the calcaneus he now has a MRI booked for 1/20. 1/20; in general his wounds continue to be better. Some improvement in the large narrow areas proximally in his foot. He is still complaining of pain in the right heel and tenderness in certain areas of this wound. His MRI is tonight. I am not just looking for osteomyelitis that was brought up on the x-ray I am wondering about stress fractures, tendon ruptures etc. He has no such findings on the left. Also noteworthy is that the patient had critical illness  neuropathy and some of the discomfort may be actual improvement in nerve function I am just not sure. These wounds were initially in the setting of severe critical illness related to COVID-19. He was put in a standing position. He may have also been on pressors at the point contributing to tissue ischemia. By his description at some point these wounds were grossly necrotic extending proximally up into the Achilles part of his heel. I do not know that I have ever really seen pictures of them like this although they may exist in epic We have ordered Tri layer Oasis. I am trying to stimulate some granulation in these areas. This is of course assuming the MRI is negative for infection 1/27; since the patient was last here he saw Dr. Juleen China of infectious disease. He is planned for vancomycin and ceftriaxone. Prior operative culture grew MSSA. Also ordered baseline lab work. He also ordered arterial studies although the ABIs in our clinic were normal as well as his clinical exam these were normal I do not think he needs to see vascular surgery. His ABIs at the [facility] were 1.22 in the right triphasic waveforms with a normal TBI of 1.15 on the left ABI of 1.22 with triphasic waveforms and a normal TBI of 1.08. Finally he saw Dr. Amalia Hailey who will follow him in for 2 months. At this point I do not think he felt that he needed a procedure on the right calcaneal bone. Dr. Juleen China is elected for broad-spectrum antibiotic The patient is still having pain in the right heel. He walks with a walker 2/3; wounds are generally smaller. He is tolerating his IV antibiotics. I believe this is vancomycin and ceftriaxone. We are still waiting for Oasis burn in terms of his out-of-pocket max which he should be meeting soon given the IV antibiotics, MRIs etc. I have asked him to check in on this. We are using silver collagen in the meantime the wounds look better 2/10; tolerating IV vancomycin  and Rocephin. We are waiting to  apply for Oasis. Although I am not really sure where he is in his out-of-pocket max. 2/17 started the first application of Oasis trilayer. Still on antibiotics. The wounds have generally look better. The area on the left has a little more surface slough requiring debridement 1/57; second application of Oasis trilayer. The wound surface granulation is generally look better. The area on the left with undermining laterally I think is come in a bit. 10/08/2020 upon evaluation today patient is here today for [employer] application #3. Fortunately he seems to be doing extremely well with regard to this and we are seeing a lot of new epithelial growth which is great news. Fortunately there is no signs of active infection at this time. 10/16/2020 upon evaluation today patient appears to be doing well with regard to his foot ulcers. Do believe the Oasis has been of benefit for him. I do not see any signs of infection right now which is great news and I think that he has a lot of new epithelial growth which is great to see as well. The patient is very pleased to hear all of this. I do think we can proceed with the Oasis trilayer #4 today. 3/18; not as much improvement in these areas on his heels that I was hoping. I did reapply trilateral Oasis today the tissue looks healthier but not as much fill in as I was hoping. 3/25; better looking today I think this is come in a bit the tissue looks healthier. Triple layer Oasis reapplied #6 4/1; somewhat better looking definitely better looking surface not as much change in surface area as I was hoping. He may be spending more time [place] on days then he needs to although he does have heel offloading boots. Triple layer Oasis reapplied #7 4/7; unfortunately apparently [facility] will not approve any further Oasis which is unfortunate since the patient did respond nicely both in terms of the condition of the wound bed as well as surface area. There is still  some drainage coming from the wound but not a lot there does not appear to be any infection 4/15; we have been using Hydrofera Blue. He continues to have nice rims of epithelialization on the right greater than the left. The left the epithelialization is coming from the tip of his heel. There is moderate drainage. In this that concerns me about a total contact cast. There is no evidence of infection 4/29; patient has been using Hydrofera Blue with dressing changes. He has no complaints or issues today. 5/5; using Hydrofera Blue. I actually think that he looks marginally better than the last time I saw this 3 weeks ago. There are rims of epithelialization on the left thumb coming from the medial side on the right. Using Hydrofera Blue 5/12; using Hydrofera Blue. These continue to make improvements in surface area. His drainage was not listed as severe I therefore went ahead and put a cast on the left foot. Right foot we will continue to dress his previous 5/16; back for first total contact cast change. He did not tolerate this particularly well cast injury on the anterior tibia among other issues. Difficulty sleeping. I talked him about this in some detail and afterwards is elected to continue. I told him I would like to have a cast on for 3 weeks to see if this is going to help at all. I think he agreed 5/19; I think the wound is better.  There is no tunneling towards his midfoot. The undermining medially also looks better. He has a rim of new skin distally. I think we are making progress here. The area on the left also continues to look somewhat better to me using Hydrofera Blue. He has a list of complaints about the cast but none of them seem serious 5/26; patient presents for 1 week follow-up. He has been using a total contact cast and tolerating this well. Hydrofera Blue is the main dressing used. He denies signs of infection. 6/2 Hydrofera Blue total contact cast on the left. These were large  ulcers that formed in intensive care unit where the patient was recovering from [place]. May have had something to do with being ventilated in an upright positiono Pressors etc. We have been able to get the areas down considerably and a viable surface. There is some epithelialization in both sides. Note made of drainage 6/9; changed to [facility] last time because of drainage. He arrives with better looking surfaces and dimensions on the left than the right. Paradoxically the right actually probes more towards his midfoot the left is largely close down but both of these look improved. Using a total contact cast on the left 6/16; complex wounds on his bilateral plantar heels which were initially pressure injury from a stay in the ICU with COVID. We have been using silver alginate most recently. His dimensions of come in quite dramatically however not recently. We have been putting the left foot in a total contact cast 6/23; complex wounds on the bilateral plantar heels. I been putting the left in the cast paradoxically the area on the right is the one that is going towards closure at a faster rate. Quite a bit of drainage on the left. The patient went to see Dr. Amalia Hailey who said he was going to standby for skin grafts. I had actually considered sending him for skin grafts however he would be mandatorily off his feet for a period of weeks to months. I am thinking that the area on the right is going to close on its own the area on the left has been more stubborn even though we have him in a total contact cast 6/30; took him out of a total contact cast last week is the right heel seem to be making better progress than the left where I was placing the cast. We are using silver alginate. Both wounds are smaller right greater than left 7/12; both wounds look as though they are making some progress. We are using silver alginate. Heel offloading boots 7/26; very gradual progress especially on the right. Using  silver alginate. He is wearing heel offloading boots 8/18; he continues to close these wounds down very gradually. Using silver alginate. The problem polymen being definitive about this is areas of what appears to be callus around the margins. This is not a 100% of the area but certainly sizable especially on the right 9/1; bilateral plantar feet wounds secondary to prolonged pressure while being ventilated for COVID-19 in an upright position. Essentially pressure ulcers on the bottom of his feet. He is made substantial progress using silver alginate. 9/14; bilateral plantar feet wounds secondary to prolonged pressure. Making progress using silver alginate. 9/29 bilateral plantar feet wounds secondary to prolonged pressure. I changed him to Iodoflex last week. MolecuLight showing reddened blush fluorescence 10/11; patient presents for follow-up. He has no issues or complaints today. He denies signs of infection. He continues to use Iodoflex and antibiotic ointment  to the wound beds. 10/27; 2-week follow-up. No evidence of infection. He has callus and thick dry skin around the wound margins we have been using Iodoflex and Bactroban which was in response to a moderate left MolecuLight reddish blush fluorescence. 11/10; 2-week follow-up. Wound margins again have thick callus however the measurements of the actual wound sites are a lot smaller. Everything looks reasonably healthy here. We have been using Iodoflex He was approved for prime matrix but I have elected to delay this given the improvement in the surface area. Hopefully I will not regret that decision as were getting close to the end of the year in terms of insurance payment 12/8; 2-week follow-up. Wounds are generally smaller in size. These were initially substantial wounds extending into the forefoot all the way into the heel on the bilateral plantar feet. They are now both located on the plantar heel distal aspect both of these have a lot of  callus around the wounds I used a #5 curette to remove this on the right and the left also some subcutaneous debris to try and get the wound edges were using Iodoflex. He has heel offloading shoe 12/22; 2-week follow-up. Not really much improvement. He has thick callus around the outer edges of both wounds. I remove this there is some nonviable subcutaneous tissue as well. We have been using Iodoflex. Her intake nurse and myself spontaneously thought of a total contact cast I went back in May. At that time we really were not seeing much of an improvement with a cast although the wound was in a much different situation I would like to retry this in 2 weeks and I discussed this with the patient 08/12/2021; the patient has had some improvement with the Iodoflex. The the area on the left heel plantar more improved than the right. I had to put him in a total contact cast on the left although I decided to put that off for 2 weeks. I am going to change his primary dressing to silver collagen. I think in both areas he has had some improvement most of the healing seems to be more proximal in the heel. The wounds are in the mid aspect. A lot of thick callus on the right heel however. 1/19; we are using silver collagen on both plantar heel areas. He has had some improvement today. The left did not require any debridement. He still had some eschar on the right that was debrided but both seem to have contracted. I did not put it total contact cast on him today 2/2 we have been using silver collagen. The area on the right plantar heel has areas that appear to be epithelialized interspersed with dry flaking callus and dry skin. I removed this. This really looks better than on the other side. On the left still a large area with raised edges and debris on the surface. The patient states he is in the heel offloading boots for a prolonged period of time and really does not use any other footwear 2/6; patient presents for  first cast exchange. He has no issues or complaints today. 2/9; not much change in the left foot wound with 1 week of a cast we are using silver collagen. Silver collagen on the right side. The right side has been the better wound surface. We will reapply the total contact cast on the left 2/16; not much improvement on either side I been using silver collagen with a total contact cast on the left. I'm changing  the [facility] Blue still with a total contact cast on the left 2/23; some improvement on both sides. Disappointing that he has thick callus around the area that we are putting in a total contact cast on the left. We've been using Hydrofera Blue on both wound areas. This is a man who at essentially pressure ulcers in addition to ischemia caused by medications to support his blood pressure (pressors) in the ICU. He was being ventilated in the standing position for severe Covid. A Shiley the wounds extended across his entire foot but are now localized to his plantar heels bilaterally. We have made progress however neither areas healed. I continue to think the total contact cast is helped albeit painstakingly slowly. He has never wanted a plastic surgery consult although I don't know that they would be interested in grafting in area in this location. Objective Constitutional Sitting or standing Blood Pressure is within target range for patient.. Pulse regular and within target range for patient.Marland Kitchen Respirations regular, non-labored and within target range.. Temperature is normal and within the target range for the patient.Marland Kitchen Appears in no distress. Vitals Time Taken: 9:42 AM, Height: 69 in, Weight: 280 lbs, BMI: 41.3, Temperature: 98.3 F, Pulse: 80 bpm, Respiratory Rate: 18 breaths/min, Blood Pressure: 120/74 mmHg. General Notes: would examooleft foot is in a total contact cast. Still a linear area that is narrow. Surrounding skin and callus removed with a #5 curet. On the right side is  similarappearance we've been using Hydrofera Blue Integumentary (Hair, Skin) Wound #1 status is Open. Original cause of wound was Pressure Injury. The date acquired was: 10/07/2019. The wound has been in treatment 63 weeks. The wound is located on the Right Calcaneus. The wound measures 2cm length x 1.1cm width x 0.2cm depth; 1.728cm^2 area and 0.346cm^3 volume. There is Fat Layer (Subcutaneous Tissue) exposed. There is no tunneling or undermining noted. There is a medium amount of serosanguineous drainage noted. There is medium (34-66%) pink granulation within the wound bed. There is a medium (34-66%) amount of necrotic tissue within the wound bed including Adherent Slough. Wound #2 status is Open. Original cause of wound was Pressure Injury. The date acquired was: 10/07/2019. The wound has been in treatment 63 weeks. The wound is located on the Left Calcaneus. The wound measures 3cm length x 0.5cm width x 0.2cm depth; 1.178cm^2 area and 0.236cm^3 volume. There is Fat Layer (Subcutaneous Tissue) exposed. There is no tunneling or undermining noted. There is a medium amount of serosanguineous drainage noted. There is large (67-100%) red granulation within the wound bed. There is a small (1-33%) amount of necrotic tissue within the wound bed including Adherent Slough. Assessment Active Problems ICD-10 Non-pressure chronic ulcer of other part of right foot with fat layer exposed Non-pressure chronic ulcer of other part of left foot with fat layer exposed Procedures Wound #1 Pre-procedure diagnosis of Wound #1 is a Pressure Ulcer located on the Right Calcaneus . There was a Excisional Skin/Subcutaneous Tissue Debridement with a total area of 2.2 sq cm performed by Ricard Dillon., MD. With the following instrument(s): Curette to remove Non-Viable tissue/material. Material removed includes Callus, Subcutaneous Tissue, and Slough after achieving pain control using Lidocaine 4% T opical Solution. No  specimens were taken. A time out was conducted at 10:17, prior to the start of the procedure. A Minimum amount of bleeding was controlled with Pressure. The procedure was tolerated well with a pain level of 1 throughout and a pain level of 1 following the procedure.  Post Debridement Measurements: 2cm length x 1.1cm width x 0.2cm depth; 0.346cm^3 volume. Post debridement Stage noted as Category/Stage III. Character of Wound/Ulcer Post Debridement is improved. Post procedure Diagnosis Wound #1: Same as Pre-Procedure Wound #2 Pre-procedure diagnosis of Wound #2 is a Pressure Ulcer located on the Left Calcaneus . There was a Excisional Skin/Subcutaneous Tissue Debridement with a total area of 1.5 sq cm performed by Ricard Dillon., MD. With the following instrument(s): Curette to remove Non-Viable tissue/material. Material removed includes Callus, Subcutaneous Tissue, and Slough after achieving pain control using Lidocaine 4% T opical Solution. No specimens were taken. A time out was conducted at 10:17, prior to the start of the procedure. A Minimum amount of bleeding was controlled with Pressure. The procedure was tolerated well with a pain level of 1 throughout and a pain level of 1 following the procedure. Post Debridement Measurements: 3cm length x 0.5cm width x 0.2cm depth; 0.236cm^3 volume. Post debridement Stage noted as Category/Stage III. Character of Wound/Ulcer Post Debridement is improved. Post procedure Diagnosis Wound #2: Same as Pre-Procedure Plan #1 difficult areas on his plantar heels bilaterally. The wounds have gradually gotten smaller although he still has callus and thick skin around the wound margins even with a total contact cast. #2 his offloading may be marginal according to his wife but I don't think this is the only issue #3 I'm continuing with the [facility]. I think we are improving here although I don't think they've been measuring the right area in  determining improvement in surface area line #4 continuing with a total contact cast Electronic Signature(s) Signed: 09/30/2021 5:03:55 PM By: Linton Ham MD Entered By: Linton Ham on 09/30/2021 10:38:12 -------------------------------------------------------------------------------- Total Contact Cast Details Patient Name: Date of Service: [name], [place] 09/30/2021 9:45 A M Medical Record Number: [id number] Patient Account Number: [id number] Date of Birth/Sex: Treating RN: [birth year] (46 y.o. [name] Primary Care Provider: [name] Other Clinician: Referring Provider: Treating Provider/Extender: [name] in Treatment: 106 T Contact Cast Applied for Wound Assessment: otal Wound #2 Left Calcaneus Performed By: Physician Ricard Dillon., MD Post Procedure Diagnosis Same as Pre-procedure Electronic Signature(s) Signed: 09/30/2021 11:30:18 AM By: [name] Signed: 09/30/2021 5:03:55 PM By: Linton Ham MD Entered By: [name] on 09/30/2021 10:57:31 -------------------------------------------------------------------------------- SuperBill Details Patient Name: Date of Service: [name], [place]. 09/30/2021 Medical Record Number: [id number] Patient Account Number: [id number] Date of Birth/Sex: Treating RN: Feb 02, 1976 (46 y.o. M) Primary Care Provider: [name] Other Clinician: Referring Provider: Treating Provider/Extender: [name] in Treatment: 63 Diagnosis Coding ICD-10 Codes Code Description [phone] Non-pressure chronic ulcer of other part of right foot with fat layer exposed L97.522 Non-pressure chronic ulcer of other part of left foot with fat layer exposed Facility Procedures CPT4 Code: [id number] Description: 11042 - DEB SUBQ TISSUE 20 SQ CM/< ICD-10 Diagnosis Description L97.522 Non-pressure chronic ulcer of other part of left foot with fat layer exposed L97.512  Non-pressure chronic ulcer of other part of right foot with fat layer exposed Modifier: Quantity: 1 CPT4 Code: [id number] Description: 70017 - APPLY TOTAL CONTACT LEG CAST ICD-10 Diagnosis Description L97.522 Non-pressure chronic ulcer of other part of left foot with fat layer exposed Modifier: Quantity: 1 Physician Procedures : CPT4 Code Description Modifier [id number] - WC PHYS SUBQ TISS 20 SQ CM ICD-10 Diagnosis Description L97.522 Non-pressure chronic ulcer of other part of left foot with fat layer exposed L97.512 Non-pressure chronic ulcer of  other part of right foot  with fat layer exposed Quantity: 1 Electronic Signature(s) Signed: 09/30/2021 5:03:55 PM By: Linton Ham MD Entered By: Linton Ham on 09/30/2021 11:27:18

## 2021-09-30 NOTE — Progress Notes (Signed)
MARCOANTONIO, LEGAULT ([id number]) Visit Report for 09/30/2021 Arrival Information Details Patient Name: Date of Service: [name], [place] 09/30/2021 9:45 A M Medical Record Number: [id number] Patient Account Number: [id number] Date of Birth/Sex: Treating RN: 05/03/1976 (46 y.o. M) Primary Care Rexford Prevo: [name] Other Clinician: Referring Levoy Geisen: Treating Tamatha Gadbois/Extender: [name] in Treatment: 62 Visit Information History Since Last Visit Added or deleted any medications: No Patient Arrived: Wheel Chair Any new allergies or adverse reactions: No Arrival Time: 09:42 Had a fall or experienced change in No Accompanied By: wife activities of daily living that may affect Transfer Assistance: None risk of falls: Patient Identification Verified: Yes Signs or symptoms of abuse/neglect since last visito No Secondary Verification Process Completed: Yes Hospitalized since last visit: No Patient Requires Transmission-Based Precautions: No Implantable device outside of the clinic excluding No Patient Has Alerts: No cellular tissue based products placed in the center since last visit: Has Dressing in Place as Prescribed: Yes Pain Present Now: Yes Electronic Signature(s) Signed: 09/30/2021 2:56:00 PM By: Sandre Kitty Entered By: Sandre Kitty on 09/30/2021 09:42:37 -------------------------------------------------------------------------------- Encounter Discharge Information Details Patient Name: Date of Service: [name], [place]. 09/30/2021 9:45 A M Medical Record Number: [id number] Patient Account Number: [id number] Date of Birth/Sex: Treating RN: Dec 21, 1975 (46 y.o. [name] Primary Care Gerrianne Aydelott: [name] Other Clinician: Referring Doc Mandala: Treating Roselynne Lortz/Extender: [name] in Treatment: 56 Encounter Discharge Information Items Post Procedure Vitals Discharge Condition: Stable Temperature (F):  98.3 Ambulatory Status: Wheelchair Pulse (bpm): 80 Discharge Destination: Home Respiratory Rate (breaths/min): 18 Transportation: Private Auto Blood Pressure (mmHg): 120/74 Accompanied By: spouse Schedule Follow-up Appointment: Yes Clinical Summary of Care: Patient Declined Electronic Signature(s) Signed: 09/30/2021 11:30:18 AM By: [name] Entered By: [name] on 09/30/2021 11:29:40 -------------------------------------------------------------------------------- Lower Extremity Assessment Details Patient Name: Date of Service: [name], [place] 09/30/2021 9:45 A M Medical Record Number: [id number] Patient Account Number: [id number] Date of Birth/Sex: Treating RN: 04-Mar-1976 (46 y.o. [name] Primary Care Delmus Warwick: [name] Other Clinician: Referring Josue Falconi: Treating Hannibal Skalla/Extender: [name] in Treatment: 63 Edema Assessment Assessed: [name]: No] [name]: No] Edema: [Left: No] [Right: No] Calf Left: Right: Point of Measurement: 29 cm From Medial Instep 41.6 cm 42.1 cm Ankle Left: Right: Point of Measurement: 9 cm From Medial Instep 24.9 cm 24 cm Electronic Signature(s) Signed: 09/30/2021 11:30:18 AM By: [name] Entered By: [name] on 09/30/2021 10:08:34 -------------------------------------------------------------------------------- Multi Wound Chart Details Patient Name: Date of Service: [name], [name] 09/30/2021 9:45 A M Medical Record Number: [id number] Patient Account Number: [id number] Date of Birth/Sex: Treating RN: 1976-04-11 (46 y.o. M) Primary Care Israel Werts: [name] Other Clinician: Referring Phoebie Shad: Treating Shona Pardo/Extender: [name] in Treatment: 46 Vital Signs Height(in): 69 Pulse(bpm): 80 Weight(lbs): 280 Blood Pressure(mmHg): 120/74 Body Mass Index(BMI): 41.3 Temperature(F): 98.3 Respiratory Rate(breaths/min): 18 Photos:  [N/A:N/A] Right Calcaneus Left Calcaneus N/A Wound Location: Pressure Injury Pressure Injury N/A Wounding Event: Pressure Ulcer Pressure Ulcer N/A Primary Etiology: Asthma, Angina, Hypertension Asthma, Angina, Hypertension N/A Comorbid History: 10/07/2019 10/07/2019 N/A Date Acquired: 63 63 N/A Weeks of Treatment: Open Open N/A Wound Status: No No N/A Wound Recurrence: 2x1.1x0.2 3x0.5x0.2 N/A Measurements L x W x D (cm) 1.728 1.178 N/A A (cm) : rea 0.346 0.236 N/A Volume (cm) : 94.50% 95.70% N/A % Reduction in A rea: 98.90% 99.10% N/A % Reduction in Volume: Category/Stage III Category/Stage III N/A Classification: Medium  Medium N/A Exudate A mount: Serosanguineous Serosanguineous N/A Exudate Type: red, brown red, brown N/A Exudate Color: Medium (34-66%) Large (67-100%) N/A Granulation A mount: Pink Red N/A Granulation Quality: Medium (34-66%) Small (1-33%) N/A Necrotic A mount: Fat Layer (Subcutaneous Tissue): Yes Fat Layer (Subcutaneous Tissue): Yes N/A Exposed Structures: Fascia: No Fascia: No Tendon: No Tendon: No Muscle: No Muscle: No Joint: No Joint: No Bone: No Bone: No Small (1-33%) None N/A Epithelialization: Treatment Notes Electronic Signature(s) Signed: 09/30/2021 5:03:55 PM By: Linton Ham MD Entered By: Linton Ham on 09/30/2021 10:28:14 -------------------------------------------------------------------------------- Pain Assessment Details Patient Name: Date of Service: [name], [name] 09/30/2021 9:45 A M Medical Record Number: [id number] Patient Account Number: [id number] Date of Birth/Sex: Treating RN: [birth year] (46 y.o. M) Primary Care [place] Talcott: [name] Other Clinician: Referring Lehman Whiteley: Treating Grayson Pfefferle/Extender: [name] in Treatment: 79 Active Problems Location of Pain Severity and Description of Pain Patient Has Paino Yes Site Locations Rate the pain. Current Pain Level: 3 Pain  Management and Medication Current Pain Management: Electronic Signature(s) Signed: 09/30/2021 2:56:00 PM By: Sandre Kitty Entered By: Sandre Kitty on 09/30/2021 09:43:12 -------------------------------------------------------------------------------- Patient/Caregiver Education Details Patient Name: Date of Service: [name], [place]. 2/23/2023andnbsp9:45 [place] Record Number: [id number] Patient Account Number: [id number] Date of Birth/Gender: Treating RN: 1976/03/13 (46 y.o. [name] Primary Care Physician: [name] Other Clinician: Referring Physician: Treating Physician/Extender: [name] in Treatment: 34 Education Assessment Education Provided To: Patient Education Topics Provided Wound/Skin Impairment: Methods: Explain/Verbal Responses: Return demonstration correctly Electronic Signature(s) Signed: 09/30/2021 11:30:18 AM By: [name] Entered By: [name] on 09/30/2021 11:22:39 -------------------------------------------------------------------------------- Wound Assessment Details Patient Name: Date of Service: [name], [place] 09/30/2021 9:45 A M Medical Record Number: [id number] Patient Account Number: [id number] Date of Birth/Sex: Treating RN: Oct 22, 1975 (46 y.o. M) Primary Care Marionna Gonia: [name] Other Clinician: Referring Kalen Neidert: Treating Inaara Tye/Extender: [name] in Treatment: 63 Wound Status Wound Number: 1 Primary Etiology: Pressure Ulcer Wound Location: Right Calcaneus Wound Status: Open Wounding Event: Pressure Injury Comorbid History: Asthma, Angina, Hypertension Date Acquired: 10/07/2019 Weeks Of Treatment: 63 Clustered Wound: No Photos Wound Measurements Length: (cm) 2 Width: (cm) 1.1 Depth: (cm) 0.2 Area: (cm) 1.728 Volume: (cm) 0.346 % Reduction in Area: 94.5% % Reduction in Volume: 98.9% Epithelialization: Small (1-33%) Tunneling:  No Undermining: No Wound Description Classification: Category/Stage III Exudate Amount: Medium Exudate Type: Serosanguineous Exudate Color: red, brown Foul Odor After Cleansing: No Slough/Fibrino Yes Wound Bed Granulation Amount: Medium (34-66%) Exposed Structure Granulation Quality: Pink Fascia Exposed: No Necrotic Amount: Medium (34-66%) Fat Layer (Subcutaneous Tissue) Exposed: Yes Necrotic Quality: Adherent Slough Tendon Exposed: No Muscle Exposed: No Joint Exposed: No Bone Exposed: No Treatment Notes Wound #1 (Calcaneus) Wound Laterality: Right Cleanser Normal Saline Discharge Instruction: Cleanse the wound with Normal Saline prior to applying a clean dressing using gauze sponges, not tissue or cotton balls. Wound Cleanser Discharge Instruction: Cleanse the wound with wound cleanser prior to applying a clean dressing using gauze sponges, not tissue or cotton balls. Peri-Wound Care Topical Primary Dressing Hydrofera Blue Classic Foam, 2x2 in Discharge Instruction: Moisten with saline prior to applying to wound bed Secondary Dressing Zetuvit Plus 4x8 in Discharge Instruction: Apply over primary dressing as directed. Secured With Elastic Bandage 4 inch (ACE bandage) Discharge Instruction: Secure with ACE bandage as directed. Kerlix Roll Sterile, 4.5x3.1 (in/yd) Discharge Instruction: Secure with Kerlix as directed. Compression Wrap Compression Stockings Add-Ons Electronic Signature(s)  Signed: 09/30/2021 11:30:18 AM By: [name] Entered By: [name] on 09/30/2021 10:04:04 -------------------------------------------------------------------------------- Wound Assessment Details Patient Name: Date of Service: [name], [place] 09/30/2021 9:45 A M Medical Record Number: [id number] Patient Account Number: [id number] Date of Birth/Sex: Treating RN: 06-10-1976 (46 y.o. M) Primary Care [place] Near: [name] Other Clinician: Referring Rayan Ines: Treating  Tyneshia Stivers/Extender: [name] in Treatment: 63 Wound Status Wound Number: 2 Primary Etiology: Pressure Ulcer Wound Location: Left Calcaneus Wound Status: Open Wounding Event: Pressure Injury Comorbid History: Asthma, Angina, Hypertension Date Acquired: 10/07/2019 Weeks Of Treatment: 63 Clustered Wound: No Photos Wound Measurements Length: (cm) 3 Width: (cm) 0.5 Depth: (cm) 0.2 Area: (cm) 1.178 Volume: (cm) 0.236 % Reduction in Area: 95.7% % Reduction in Volume: 99.1% Epithelialization: None Tunneling: No Undermining: No Wound Description Classification: Category/Stage III Exudate Amount: Medium Exudate Type: Serosanguineous Exudate Color: red, brown Wound Bed Granulation Amount: Large (67-100%) Exposed Structure Granulation Quality: Red Fascia Exposed: No Necrotic Amount: Small (1-33%) Fat Layer (Subcutaneous Tissue) Exposed: Yes Necrotic Quality: Adherent Slough Tendon Exposed: No Muscle Exposed: No Joint Exposed: No Bone Exposed: No Treatment Notes Wound #2 (Calcaneus) Wound Laterality: Left Cleanser Normal Saline Discharge Instruction: Cleanse the wound with Normal Saline prior to applying a clean dressing using gauze sponges, not tissue or cotton balls. Wound Cleanser Discharge Instruction: Cleanse the wound with wound cleanser prior to applying a clean dressing using gauze sponges, not tissue or cotton balls. Peri-Wound Care Topical Primary Dressing Hydrofera Blue Classic Foam, 2x2 in Discharge Instruction: Moisten with saline prior to applying to wound bed Secondary Dressing Zetuvit Plus 4x8 in Discharge Instruction: Apply over primary dressing as directed. Secured With [employer], 4.5x3.1 (in/yd) Discharge Instruction: Secure with Kerlix as directed. 1M Medipore H Soft Cloth Surgical T ape, 4 x 10 (in/yd) Discharge Instruction: Secure with tape as directed. Compression Wrap Compression Stockings Add-Ons Electronic  Signature(s) Signed: 09/30/2021 11:30:18 AM By: [name] Entered By: [name] on 09/30/2021 10:05:32 -------------------------------------------------------------------------------- Vitals Details Patient Name: Date of Service: [name], [place] 09/30/2021 9:45 A M Medical Record Number: [id number] Patient Account Number: [id number] Date of Birth/Sex: Treating RN: 06-23-1976 (46 y.o. M) Primary Care Marylene Masek: [name] Other Clinician: Referring Madason Rauls: Treating Fredderick Swanger/Extender: [name] in Treatment: 63 Vital Signs Time Taken: 09:42 Temperature (F): 98.3 Height (in): 69 Pulse (bpm): 80 Weight (lbs): 280 Respiratory Rate (breaths/min): 18 Body Mass Index (BMI): 41.3 Blood Pressure (mmHg): 120/74 Reference Range: 80 - 120 mg / dl Electronic Signature(s) Signed: 09/30/2021 2:56:00 PM By: Sandre Kitty Entered By: Sandre Kitty on 09/30/2021 09:42:56

## 2021-10-01 IMAGING — DX DG CHEST 1V PORT
1 series · 1 of 1 positions shown · non-contrast
Comparison: None

CLINICAL DATA: Hypoxia. [id number]. Cough. Diarrhea and vomiting.

EXAM:
PORTABLE CHEST 1 VIEW

[chest ap]
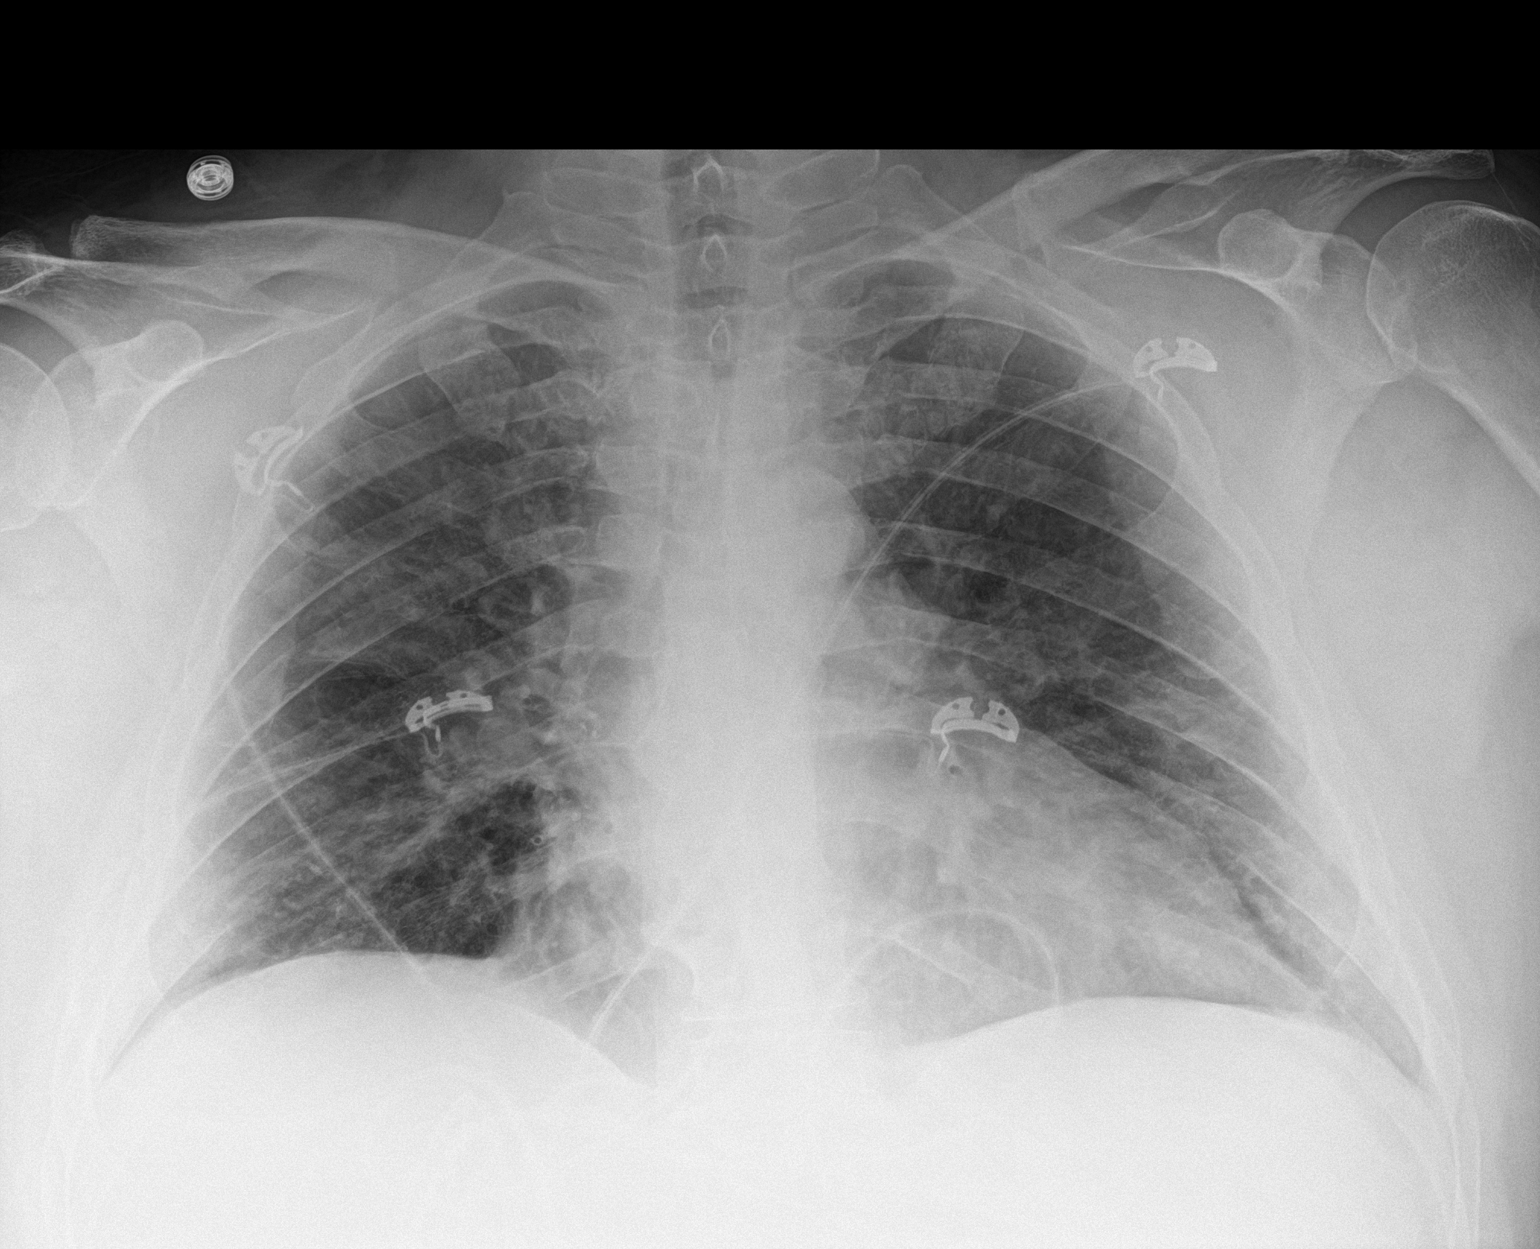

[1 of 1 positions shown; findings below may reference images not displayed]

FINDINGS: There are faint infiltrates in the right midzone and at the right
base laterally. Left lung is clear.

Heart size and vascularity are normal. No effusions. No significant
bone abnormality.
IMPRESSION: Faint infiltrates in the right midzone and at the right lung base.
The appearance is suspicious for pneumonia.

## 2021-10-07 ENCOUNTER — Encounter (HOSPITAL_BASED_OUTPATIENT_CLINIC_OR_DEPARTMENT_OTHER): Payer: BC Managed Care – PPO | Attending: Internal Medicine | Admitting: General Surgery

## 2021-10-07 ENCOUNTER — Other Ambulatory Visit: Payer: Self-pay

## 2021-10-07 DIAGNOSIS — I10 Essential (primary) hypertension: Secondary | ICD-10-CM | POA: Insufficient documentation

## 2021-10-07 DIAGNOSIS — L97512 Non-pressure chronic ulcer of other part of right foot with fat layer exposed: Secondary | ICD-10-CM | POA: Diagnosis not present

## 2021-10-07 DIAGNOSIS — L97522 Non-pressure chronic ulcer of other part of left foot with fat layer exposed: Secondary | ICD-10-CM | POA: Insufficient documentation

## 2021-10-07 DIAGNOSIS — Z8616 Personal history of COVID-19: Secondary | ICD-10-CM | POA: Diagnosis not present

## 2021-10-07 DIAGNOSIS — G4734 Idiopathic sleep related nonobstructive alveolar hypoventilation: Secondary | ICD-10-CM | POA: Diagnosis not present

## 2021-10-07 DIAGNOSIS — U071 COVID-19: Secondary | ICD-10-CM | POA: Diagnosis not present

## 2021-10-07 IMAGING — DX DG CHEST 1V PORT
1 series · 1 of 1 positions shown · non-contrast
Comparison: Chest x-ray dated July 22, 2019.

CLINICAL DATA: Dyspnea.

EXAM:
PORTABLE CHEST 1 VIEW

[chest]
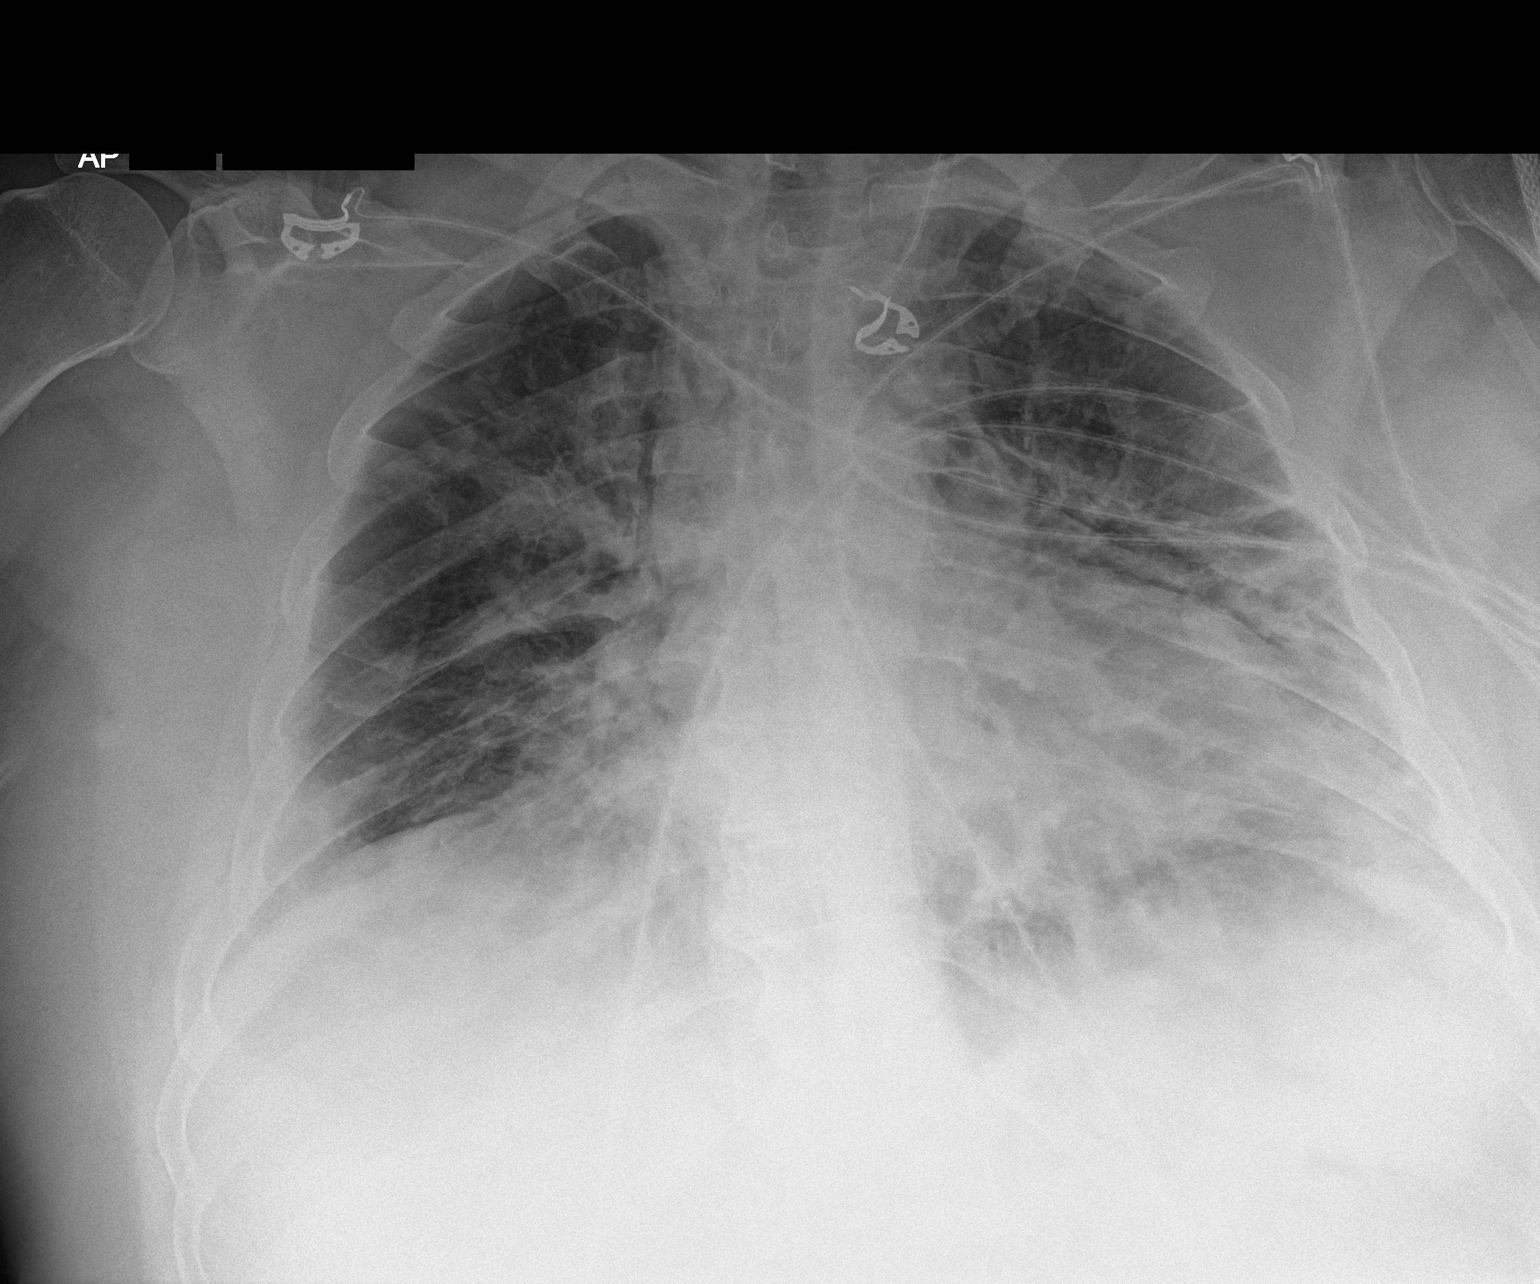

[1 of 1 positions shown; findings below may reference images not displayed]

FINDINGS: The heart size and mediastinal contours are within normal limits.
Progressive bilateral interstitial and patchy airspace opacities. No
pleural effusion or pneumothorax. No acute osseous abnormality.
IMPRESSION: 1. Progressive multifocal pneumonia.

## 2021-10-07 NOTE — Progress Notes (Signed)
[name] ([id number]) Visit Report for 10/07/2021 Chief Complaint Document Details Patient Name: Date of Service: [name] 10/07/2021 9:45 A M Medical Record Number: [id number] Patient Account Number: [id number] Date of Birth/Sex: Treating RN: [birth year] (46 y.o. M) Primary Care Provider: [name] Other Clinician: Referring Provider: Treating Provider/Extender: [name] in Treatment: 70 Information Obtained from: Patient Chief Complaint Bilateral Plantar Foot Ulcers Electronic Signature(s) Signed: 10/07/2021 10:31:53 AM By: Fredirick Maudlin MD FACS Entered By: Fredirick Maudlin on 10/07/2021 10:31:53 -------------------------------------------------------------------------------- Debridement Details Patient Name: Date of Service: [name], [name] 10/07/2021 9:45 A M Medical Record Number: [id number] Patient Account Number: [id number] Date of Birth/Sex: Treating RN: 1976/02/21 (46 y.o. [name] Primary Care Provider: [name] Other Clinician: Referring Provider: Treating Provider/Extender: [name] in Treatment: 64 Debridement Performed for Assessment: Wound #2 Left Calcaneus Performed By: Physician Fredirick Maudlin, MD Debridement Type: Debridement Level of Consciousness (Pre-procedure): Awake and Alert Pre-procedure Verification/Time Out Yes - 10:17 Taken: Start Time: 10:17 Pain Control: Lidocaine 5% topical ointment T Area Debrided (L x W): otal 2.5 (cm) x 0.5 (cm) = 1.25 (cm) Tissue and other material debrided: Non-Viable, Callus, Slough, Subcutaneous, Slough Level: Skin/Subcutaneous Tissue Debridement Description: Excisional Instrument: Curette Bleeding: Minimum Hemostasis Achieved: Pressure End Time: 10:19 Procedural Pain: 1 Post Procedural Pain: 1 Response [name] Treatment: Procedure was tolerated well Level of Consciousness (Post- Awake and Alert procedure): Post Debridement Measurements  of Total Wound Length: (cm) 2.5 Stage: Category/Stage III Width: (cm) 0.5 Depth: (cm) 0.2 Volume: (cm) 0.196 Character of Wound/Ulcer Post Debridement: Improved Post Procedure Diagnosis Same as Pre-procedure Electronic Signature(s) Signed: 10/07/2021 10:40:57 AM By: Fredirick Maudlin MD FACS Signed: 10/07/2021 42:59:56 PM By: [name] Entered By: [name] on 10/07/2021 10:25:19 -------------------------------------------------------------------------------- Debridement Details Patient Name: Date of Service: [name], [name] 10/07/2021 9:45 A M Medical Record Number: [id number] Patient Account Number: [id number] Date of Birth/Sex: Treating RN: 1975/12/13 (46 y.o. [name] Primary Care Provider: [name] Other Clinician: Referring Provider: Treating Provider/Extender: [name] in Treatment: 64 Debridement Performed for Assessment: Wound #1 Right Calcaneus Performed By: Physician Fredirick Maudlin, MD Debridement Type: Debridement Level of Consciousness (Pre-procedure): Awake and Alert Pre-procedure Verification/Time Out Yes - 10:17 Taken: Start Time: 10:17 Pain Control: Lidocaine 5% topical ointment T Area Debrided (L x W): otal 2.1 (cm) x 0.6 (cm) = 1.26 (cm) Tissue and other material debrided: Non-Viable, Callus, Slough, Subcutaneous, Slough Level: Skin/Subcutaneous Tissue Debridement Description: Excisional Instrument: Curette Bleeding: Minimum Hemostasis Achieved: Pressure End Time: 10:19 Procedural Pain: 1 Post Procedural Pain: 1 Response [name] Treatment: Procedure was tolerated well Level of Consciousness (Post- Awake and Alert procedure): Post Debridement Measurements of Total Wound Length: (cm) 2.1 Stage: Category/Stage III Width: (cm) 0.6 Depth: (cm) 0.2 Volume: (cm) 0.198 Character of Wound/Ulcer Post Debridement: Improved Post Procedure Diagnosis Same as Pre-procedure Electronic Signature(s) Signed:  10/07/2021 10:40:57 AM By: Fredirick Maudlin MD FACS Signed: 10/07/2021 95:18:84 PM By: [name] Entered By: [name] on 10/07/2021 10:29:28 -------------------------------------------------------------------------------- HPI Details Patient Name: Date of Service: [name], [name] [place] E. 10/07/2021 9:45 A M Medical Record Number: [id number] Patient Account Number: [id number] Date of Birth/Sex: Treating RN: 08/10/1975 (46 y.o. M) Primary Care Provider: [name] Other Clinician: Referring Provider: Treating Provider/Extender: [name] in Treatment: 34 History of Present Illness HPI Description: Wounds are12/03/2020 upon evaluation today patient presents for initial inspection here in our clinic concerning  issues he has been having with the bottoms of his feet bilaterally. He states these actually occurred as wounds when he was hospitalized for 5 months secondary [name] Covid. He was apparently with tilting bed where he was in an upright position quite frequently and apparently this occurred in some way shape or form during that time. Fortunately there is no sign of active infection at this time. No fevers, chills, nausea, vomiting, or diarrhea. With that being said he still has substantial wounds on the plantar aspects of his feet Theragen require quite a bit of work [name] get these [name] heal. He has been using Santyl currently though that is been problematic both in receiving the medication as well as actually paid for it as it is become quite expensive. Prior [name] the experience with Covid the patient really did not have any major medical problems other than hypertension he does have some mild generalized weakness following the Covid experience. 07/22/2020 on evaluation today patient appears [name] be doing okay in regard [name] his foot ulcers I feel like the wound beds are showing signs of better improvement that I do believe the Iodoflex is helping in this regard. With that  being said he does have a lot of drainage currently and this is somewhat blue/green in nature which is consistent with Pseudomonas. I do think a culture today would be appropriate for [place] [name] evaluate and see if that is indeed the case I would likely start him on antibiotic orally as well he is not allergic [name] Cipro knows of no issues he has had in the past 12/21; patient was admitted [name] the clinic earlier this month with bilateral presumed pressure ulcers on the bottom of his feet apparently related [name] excessive pressure from a tilt table arrangement in the intensive care unit. Patient relates this [name] being on ECMO but I am not really sure that is exactly related [name] that. I must say I have never seen anything like this. He has fairly extensive full-thickness wounds extending from his heel towards his midfoot mostly centered laterally. There is already been some healing distally. He does not appear [name] have an arterial issue. He has been using gentamicin [name] the wound surfaces with Iodoflex [name] help with ongoing debridement 1/6; this is a patient with pressure ulcers on the bottom of his feet related [name] excessive pressure from a standing position in the intensive care unit. He is complaining of a lot of pain in the right heel. He is not a diabetic. 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 We have been using Iodoflex [name] help prepare the surfaces of both wounds for an advanced treatment product. He is nonambulatory spending most of his time in a wheelchair I have asked him not [name] propel the wheelchair with his heels 1/13; in general his wounds look better not much surface area change we have been using Iodoflex as of last week. I did an x-ray of the right heel as the patient was complaining of pain. I had some thoughts about a stress fracture perhaps Achilles tendon problems however what it showed was erosive changes along the inferior aspect of the calcaneus he now has a MRI booked  for 1/20. 1/20; in general his wounds continue [name] be better. Some improvement in the large narrow areas proximally in his foot. He is still complaining of pain in the right heel and tenderness in certain areas of this wound. His MRI is tonight. I am not just looking for  osteomyelitis that was brought up on the x-ray I am wondering about stress fractures, tendon ruptures etc. He has no such findings on the left. Also noteworthy is that the patient had critical illness neuropathy and some of the discomfort may be actual improvement in nerve function I am just not sure. These wounds were initially in the setting of severe critical illness related [name] COVID-19. He was put in a standing position. He may have also been on pressors at the point contributing [name] tissue ischemia. By his description at some point these wounds were grossly necrotic extending proximally up into the Achilles part of his heel. I do not know that I have ever really seen pictures of them like this although they may exist in epic We have ordered Tri layer Oasis. I am trying [name] stimulate some granulation in these areas. This is of course assuming the MRI is negative for infection 1/27; since the patient was last here he saw Dr. Juleen China of infectious disease. He is planned for vancomycin and ceftriaxone. Prior operative culture grew MSSA. Also ordered baseline lab work. He also ordered arterial studies although the ABIs in our clinic were normal as well as his clinical exam these were normal I do not think he needs [name] see vascular surgery. His ABIs at the [facility] were 1.22 in the right triphasic waveforms with a normal TBI of 1.15 on the left ABI of 1.22 with triphasic waveforms and a normal TBI of 1.08. Finally he saw Dr. Amalia Hailey who will follow him in for 2 months. At this point I do not think he felt that he needed a procedure on the right calcaneal bone. Dr. Juleen China is elected for broad-spectrum antibiotic The patient is still having pain  in the right heel. He walks with a walker 2/3; wounds are generally smaller. He is tolerating his IV antibiotics. I believe this is vancomycin and ceftriaxone. We are still waiting for Oasis burn in terms of his out-of-pocket max which he should be meeting soon given the IV antibiotics, MRIs etc. I have asked him [name] check in on this. We are using silver collagen in the meantime the wounds look better 2/10; tolerating IV vancomycin and Rocephin. We are waiting [name] apply for Oasis. Although I am not really sure where he is in his out-of-pocket max. 2/17 started the first application of Oasis trilayer. Still on antibiotics. The wounds have generally look better. The area on the left has a little more surface slough requiring debridement 8/67; second application of Oasis trilayer. The wound surface granulation is generally look better. The area on the left with undermining laterally I think is come in a bit. 10/08/2020 upon evaluation today patient is here today for [employer] application #3. Fortunately he seems [name] be doing extremely well with regard [name] this and we are seeing a lot of new epithelial growth which is great news. Fortunately there is no signs of active infection at this time. 10/16/2020 upon evaluation today patient appears [name] be doing well with regard [name] his foot ulcers. Do believe the Oasis has been of benefit for him. I do not see any signs of infection right now which is great news and I think that he has a lot of new epithelial growth which is great [name] see as well. The patient is very pleased [name] hear all of this. I do think we can proceed with the Oasis trilayer #4 today. 3/18; not as much improvement in these areas on his heels that I  was hoping. I did reapply trilateral Oasis today the tissue looks healthier but not as much fill in as I was hoping. 3/25; better looking today I think this is come in a bit the tissue looks healthier. Triple layer Oasis reapplied #6 4/1; somewhat  better looking definitely better looking surface not as much change in surface area as I was hoping. He may be spending more time [place] on days then he needs [name] although he does have heel offloading boots. Triple layer Oasis reapplied #7 4/7; unfortunately apparently [facility] will not approve any further Oasis which is unfortunate since the patient did respond nicely both in terms of the condition of the wound bed as well as surface area. There is still some drainage coming from the wound but not a lot there does not appear [name] be any infection 4/15; we have been using Hydrofera Blue. He continues [name] have nice rims of epithelialization on the right greater than the left. The left the epithelialization is coming from the tip of his heel. There is moderate drainage. In this that concerns me about a total contact cast. There is no evidence of infection 4/29; patient has been using Hydrofera Blue with dressing changes. He has no complaints or issues today. 5/5; using Hydrofera Blue. I actually think that he looks marginally better than the last time I saw this 3 weeks ago. There are rims of epithelialization on the left thumb coming from the medial side on the right. Using Hydrofera Blue 5/12; using Hydrofera Blue. These continue [name] make improvements in surface area. His drainage was not listed as severe I therefore went ahead and put a cast on the left foot. Right foot we will continue [name] dress his previous 5/16; back for first total contact cast change. He did not tolerate this particularly well cast injury on the anterior tibia among other issues. Difficulty sleeping. I talked him about this in some detail and afterwards is elected [name] continue. I told him I would like [name] have a cast on for 3 weeks [name] see if this is going [name] help at all. I think he agreed 5/19; I think the wound is better. There is no tunneling towards his midfoot. The undermining medially also looks better. He has a rim of  new skin distally. I think we are making progress here. The area on the left also continues [name] look somewhat better [name] me using Hydrofera Blue. He has a list of complaints about the cast but none of them seem serious 5/26; patient presents for 1 week follow-up. He has been using a total contact cast and tolerating this well. Hydrofera Blue is the main dressing used. He denies signs of infection. 6/2 Hydrofera Blue total contact cast on the left. These were large ulcers that formed in intensive care unit where the patient was recovering from [place]. May have had something to do with being ventilated in an upright positiono Pressors etc. We have been able [name] get the areas down considerably and a viable surface. There is some epithelialization in both sides. Note made of drainage 6/9; changed [name] [facility] last time because of drainage. He arrives with better looking surfaces and dimensions on the left than the right. Paradoxically the right actually probes more towards his midfoot the left is largely close down but both of these look improved. Using a total contact cast on the left 6/16; complex wounds on his bilateral plantar heels which were initially pressure injury from a stay  in the ICU with COVID. We have been using silver alginate most recently. His dimensions of come in quite dramatically however not recently. We have been putting the left foot in a total contact cast 6/23; complex wounds on the bilateral plantar heels. I been putting the left in the cast paradoxically the area on the right is the one that is going towards closure at a faster rate. Quite a bit of drainage on the left. The patient went [name] see Dr. Amalia Hailey who said he was going [name] standby for skin grafts. I had actually considered sending him for skin grafts however he would be mandatorily off his feet for a period of weeks [name] months. I am thinking that the area on the right is going [name] close on its own the area on the left has  been more stubborn even though we have him in a total contact cast 6/30; took him out of a total contact cast last week is the right heel seem [name] be making better progress than the left where I was placing the cast. We are using silver alginate. Both wounds are smaller right greater than left 7/12; both wounds look as though they are making some progress. We are using silver alginate. Heel offloading boots 7/26; very gradual progress especially on the right. Using silver alginate. He is wearing heel offloading boots 8/18; he continues [name] close these wounds down very gradually. Using silver alginate. The problem polymen being definitive about this is areas of what appears [name] be callus around the margins. This is not a 100% of the area but certainly sizable especially on the right 9/1; bilateral plantar feet wounds secondary [name] prolonged pressure while being ventilated for COVID-19 in an upright position. Essentially pressure ulcers on the bottom of his feet. He is made substantial progress using silver alginate. 9/14; bilateral plantar feet wounds secondary [name] prolonged pressure. Making progress using silver alginate. 9/29 bilateral plantar feet wounds secondary [name] prolonged pressure. I changed him [name] Iodoflex last week. MolecuLight showing reddened blush fluorescence 10/11; patient presents for follow-up. He has no issues or complaints today. He denies signs of infection. He continues [name] use Iodoflex and antibiotic ointment [name] the wound beds. 10/27; 2-week follow-up. No evidence of infection. He has callus and thick dry skin around the wound margins we have been using Iodoflex and Bactroban which was in response [name] a moderate left MolecuLight reddish blush fluorescence. 11/10; 2-week follow-up. Wound margins again have thick callus however the measurements of the actual wound sites are a lot smaller. Everything looks reasonably healthy here. We have been using Iodoflex He was approved for prime  matrix but I have elected [name] delay this given the improvement in the surface area. Hopefully I will not regret that decision as were getting close [name] the end of the year in terms of insurance payment 12/8; 2-week follow-up. Wounds are generally smaller in size. These were initially substantial wounds extending into the forefoot all the way into the heel on the bilateral plantar feet. They are now both located on the plantar heel distal aspect both of these have a lot of callus around the wounds I used a #5 curette [name] remove this on the right and the left also some subcutaneous debris [name] try and get the wound edges were using Iodoflex. He has heel offloading shoe 12/22; 2-week follow-up. Not really much improvement. He has thick callus around the outer edges of both wounds. I remove this there is some nonviable subcutaneous tissue  as well. We have been using Iodoflex. Her intake nurse and myself spontaneously thought of a total contact cast I went back in May. At that time we really were not seeing much of an improvement with a cast although the wound was in a much different situation I would like [name] retry this in 2 weeks and I discussed this with the patient 08/12/2021; the patient has had some improvement with the Iodoflex. The the area on the left heel plantar more improved than the right. I had [name] put him in a total contact cast on the left although I decided [name] put that off for 2 weeks. I am going [name] change his primary dressing [name] silver collagen. I think in both areas he has had some improvement most of the healing seems [name] be more proximal in the heel. The wounds are in the mid aspect. A lot of thick callus on the right heel however. 1/19; we are using silver collagen on both plantar heel areas. He has had some improvement today. The left did not require any debridement. He still had some eschar on the right that was debrided but both seem [name] have contracted. I did not put it total contact cast on  him today 2/2 we have been using silver collagen. The area on the right plantar heel has areas that appear [name] be epithelialized interspersed with dry flaking callus and dry skin. I removed this. This really looks better than on the other side. On the left still a large area with raised edges and debris on the surface. 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 He has no issues or complaints today. 2/9; not much change in the left foot wound with 1 week of a cast we are using silver collagen. Silver collagen on the right side. The right side has been the better wound surface. We will reapply the total contact cast on the left 2/16; not much improvement on either side I been using silver collagen with a total contact cast on the left. I'm changing the Hydrofera Blue still with a total contact cast on the left 2/23; some improvement on both sides. Disappointing that he has thick callus around the area that we are putting in a total contact cast on the left. We've been using Hydrofera Blue on both wound areas. This is a man who at essentially pressure ulcers in addition [name] ischemia caused by medications [name] support his blood pressure (pressors) in the ICU. He was being ventilated in the standing position for severe Covid. A Shiley the wounds extended across his entire foot but are now localized [name] his plantar heels bilaterally. We have made progress however neither areas healed. I continue [name] think the total contact cast is helped albeit painstakingly slowly. He has never wanted a plastic surgery consult although I don't know that they would be interested in grafting in area in this location. 10/07/2021: Continued improvement bilaterally. There is still some callus around the left wound, despite the total contact cast. He has some increased pain in his right midfoot around 1  particular area. This has been painful in the past but seems [name] be a little bit worse. When his cast was removed today, there was an area on the heel of the left foot that looks a bit macerated. He is also complaining of pain in his left thigh  and hip which he thinks is secondary [name] the limb length discrepancy caused by the cast. Electronic Signature(s) Signed: 10/07/2021 10:33:51 AM By: Fredirick Maudlin MD FACS Entered By: Fredirick Maudlin on 10/07/2021 10:33:50 -------------------------------------------------------------------------------- Physical Exam Details Patient Name: Date of Service: [name], Alice. 10/07/2021 9:45 A M Medical Record Number: [id number] Patient Account Number: [id number] Date of Birth/Sex: Treating RN: March 07, 1976 (46 y.o. M) Primary Care Provider: [name] Other Clinician: Referring Provider: Treating Provider/Extender: [name] in Treatment: 44 Constitutional He is hypertensive. . . He is obese. No acute distress. Notes 10/07/2021: Wound examof the left foot, which is in a total contact cast shows continued improvement. There is some maceration of the skin on the posterior heel. There is still some callus forming, despite the cast. On the right, and the area that he is complaining of more pain, I do not see any evidence of infection. I removed the callus and debris from this site and cannot identify an appreciable source for his discomfort. Electronic Signature(s) Signed: 10/07/2021 10:36:22 AM By: Fredirick Maudlin MD FACS Entered By: Fredirick Maudlin on 10/07/2021 10:36:22 -------------------------------------------------------------------------------- Physician Orders Details Patient Name: Date of Service: [name], [name] 10/07/2021 9:45 A M Medical Record Number: [id number] Patient Account Number: [id number] Date of Birth/Sex: Treating RN: Oct 10, 1975 (46 y.o. [name] Primary Care Provider: [name] Other  Clinician: Referring Provider: Treating Provider/Extender: [name] in Treatment: 45 Verbal / Phone Orders: No Diagnosis Coding ICD-10 Coding Code Description L97.512 Non-pressure chronic ulcer of other part of right foot with fat layer exposed L97.522 Non-pressure chronic ulcer of other part of left foot with fat layer exposed Follow-up Appointments ppointment in 1 week. - Dr [name] Thursday Return A Bathing/ Shower/ Hygiene May shower with protection but do not get wound dressing(s) wet. - Cover with cast protector (can purchase cast protector at [facility] or [facility] ) Edema Control - Lymphedema / SCD / Other Bilateral Lower Extremities Elevate legs [name] the level of the heart or above for 30 minutes daily and/or when sitting, a frequency of: - throughout the day Avoid standing for long periods of time. Moisturize legs daily. - right leg and foot every night. Off-Loading Total Contact Cast [name] Left Lower Extremity Wedge shoe [name]: - Glophed Shoe [name] right foot Other: - keep pressure off of the bottom of your feet Additional Orders / Instructions Follow Nutritious Diet Wound Treatment Wound #1 - Calcaneus Wound Laterality: Right Cleanser: Normal Saline (Generic) Every Other Day/30 Days Discharge Instructions: Cleanse the wound with Normal Saline prior [name] applying a clean dressing using gauze sponges, not tissue or cotton balls. Cleanser: Wound Cleanser Every Other Day/30 Days Discharge Instructions: Cleanse the wound with wound cleanser prior [name] applying a clean dressing using gauze sponges, not tissue or cotton balls. Prim Dressing: Hydrofera Blue Classic Foam, 2x2 in Every Other Day/30 Days ary Discharge Instructions: Moisten with saline prior [name] applying [name] wound bed Secondary Dressing: Zetuvit Plus 4x8 in (Generic) Every Other Day/30 Days Discharge Instructions: Apply over primary dressing as directed. Secured With: Elastic Bandage 4 inch (ACE bandage)  (Generic) Every Other Day/30 Days Discharge Instructions: Secure with ACE bandage as directed. Secured With: [employer], 4.5x3.1 (in/yd) (Generic) Every Other Day/30 Days Discharge Instructions: Secure with Kerlix as directed. Wound #2 - Calcaneus Wound Laterality: Left Cleanser: Normal Saline (Generic) 1 x Per Week/30 Days Discharge Instructions: Cleanse the wound with Normal Saline prior [name] applying a clean dressing using  gauze sponges, not tissue or cotton balls. Cleanser: Wound Cleanser 1 x Per Week/30 Days Discharge Instructions: Cleanse the wound with wound cleanser prior [name] applying a clean dressing using gauze sponges, not tissue or cotton balls. Peri-Wound Care: Zinc Oxide Ointment 30g tube 1 x Per Week/30 Days Discharge Instructions: Apply Zinc Oxide [name] periwound with each dressing change Prim Dressing: Hydrofera Blue Classic Foam, 2x2 in 1 x Per Week/30 Days ary Discharge Instructions: Moisten with saline prior [name] applying [name] wound bed Secondary Dressing: Zetuvit Plus 4x8 in (Generic) 1 x Per Week/30 Days Discharge Instructions: Apply over primary dressing as directed. Secured With: [employer], 4.5x3.1 (in/yd) (Generic) 1 x Per Week/30 Days Discharge Instructions: Secure with Kerlix as directed. Secured With: 74M Medipore H Soft Cloth Surgical T ape, 4 x 10 (in/yd) 1 x Per Week/30 Days Discharge Instructions: Secure with tape as directed. Electronic Signature(s) Signed: 10/07/2021 10:40:57 AM By: Fredirick Maudlin MD FACS Entered By: Fredirick Maudlin on 10/07/2021 10:37:07 -------------------------------------------------------------------------------- Problem List Details Patient Name: Date of Service: [name], [name] 10/07/2021 9:45 A M Medical Record Number: [id number] Patient Account Number: [id number] Date of Birth/Sex: Treating RN: 1976-02-26 (46 y.o. M) Primary Care Provider: [name] Other Clinician: Referring Provider: Treating Provider/Extender:  [name] in Treatment: 13 Active Problems ICD-10 Encounter Code Description Active Date MDM Diagnosis L97.512 Non-pressure chronic ulcer of other part of right foot with fat layer exposed 09/03/2020 No Yes L97.522 Non-pressure chronic ulcer of other part of left foot with fat layer exposed 09/03/2020 No Yes Inactive Problems ICD-10 Code Description Active Date Inactive Date L89.893 Pressure ulcer of other site, stage 3 07/15/2020 07/15/2020 M62.81 Muscle weakness (generalized) 07/15/2020 07/15/2020 I10 Essential (primary) hypertension 07/15/2020 07/15/2020 M86.171 Other acute osteomyelitis, right ankle and foot 09/03/2020 09/03/2020 Resolved Problems Electronic Signature(s) Signed: 10/07/2021 10:29:34 AM By: Fredirick Maudlin MD FACS Entered By: Fredirick Maudlin on 10/07/2021 10:29:34 -------------------------------------------------------------------------------- Progress Note Details Patient Name: Date of Service: [name], Bayard. 10/07/2021 9:45 A M Medical Record Number: [id number] Patient Account Number: [id number] Date of Birth/Sex: Treating RN: 1975/10/19 (46 y.o. M) Primary Care Provider: [name] Other Clinician: Referring Provider: Treating Provider/Extender: [name] in Treatment: 61 Subjective Chief Complaint Information obtained from Patient Bilateral Plantar Foot Ulcers History of Present Illness (HPI) Wounds are12/03/2020 upon evaluation today patient presents for initial inspection here in our clinic concerning issues he has been having with the bottoms of his feet bilaterally. He states these actually occurred as wounds when he was hospitalized for 5 months secondary [name] Covid. He was apparently with tilting bed where he was in an upright position quite frequently and apparently this occurred in some way shape or form during that time. Fortunately there is no sign of active infection at this time. No fevers, chills,  nausea, vomiting, or diarrhea. With that being said he still has substantial wounds on the plantar aspects of his feet Theragen require quite a bit of work [name] get these [name] heal. He has been using Santyl currently though that is been problematic both in receiving the medication as well as actually paid for it as it is become quite expensive. Prior [name] the experience with Covid the patient really did not have any major medical problems other than hypertension he does have some mild generalized weakness following the Covid experience. 07/22/2020 on evaluation today patient appears [name] be doing okay in regard [name] his foot ulcers I feel like the wound beds are  showing signs of better improvement that I do believe the Iodoflex is helping in this regard. With that being said he does have a lot of drainage currently and this is somewhat blue/green in nature which is consistent with Pseudomonas. I do think a culture today would be appropriate for [place] [name] evaluate and see if that is indeed the case I would likely start him on antibiotic orally as well he is not allergic [name] Cipro knows of no issues he has had in the past 12/21; patient was admitted [name] the clinic earlier this month with bilateral presumed pressure ulcers on the bottom of his feet apparently related [name] excessive pressure from a tilt table arrangement in the intensive care unit. Patient relates this [name] being on ECMO but I am not really sure that is exactly related [name] that. I must say I have never seen anything like this. He has fairly extensive full-thickness wounds extending from his heel towards his midfoot mostly centered laterally. There is already been some healing distally. He does not appear [name] have an arterial issue. He has been using gentamicin [name] the wound surfaces with Iodoflex [name] help with ongoing debridement 1/6; this is a patient with pressure ulcers on the bottom of his feet related [name] excessive pressure from a standing position in the  intensive care unit. He is complaining of a lot of pain in the right heel. He is not a diabetic. 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 We have been using Iodoflex [name] help prepare the surfaces of both wounds for an advanced treatment product. He is nonambulatory spending most of his time in a wheelchair I have asked him not [name] propel the wheelchair with his heels 1/13; in general his wounds look better not much surface area change we have been using Iodoflex as of last week. I did an x-ray of the right heel as the patient was complaining of pain. I had some thoughts about a stress fracture perhaps Achilles tendon problems however what it showed was erosive changes along the inferior aspect of the calcaneus he now has a MRI booked for 1/20. 1/20; in general his wounds continue [name] be better. Some improvement in the large narrow areas proximally in his foot. He is still complaining of pain in the right heel and tenderness in certain areas of this wound. His MRI is tonight. I am not just looking for osteomyelitis that was brought up on the x-ray I am wondering about stress fractures, tendon ruptures etc. He has no such findings on the left. Also noteworthy is that the patient had critical illness neuropathy and some of the discomfort may be actual improvement in nerve function I am just not sure. These wounds were initially in the setting of severe critical illness related [name] COVID-19. He was put in a standing position. He may have also been on pressors at the point contributing [name] tissue ischemia. By his description at some point these wounds were grossly necrotic extending proximally up into the Achilles part of his heel. I do not know that I have ever really seen pictures of them like this although they may exist in epic We have ordered Tri layer Oasis. I am trying [name] stimulate some granulation in these areas. This is of course assuming the MRI is negative for infection 1/27;  since the patient was last here he saw Dr. Juleen China of infectious disease. He is planned for vancomycin and ceftriaxone. Prior operative culture grew MSSA. Also ordered baseline  lab work. He also ordered arterial studies although the ABIs in our clinic were normal as well as his clinical exam these were normal I do not think he needs [name] see vascular surgery. His ABIs at the [facility] were 1.22 in the right triphasic waveforms with a normal TBI of 1.15 on the left ABI of 1.22 with triphasic waveforms and a normal TBI of 1.08. Finally he saw Dr. Amalia Hailey who will follow him in for 2 months. At this point I do not think he felt that he needed a procedure on the right calcaneal bone. Dr. Juleen China is elected for broad-spectrum antibiotic The patient is still having pain in the right heel. He walks with a walker 2/3; wounds are generally smaller. He is tolerating his IV antibiotics. I believe this is vancomycin and ceftriaxone. We are still waiting for Oasis burn in terms of his out-of-pocket max which he should be meeting soon given the IV antibiotics, MRIs etc. I have asked him [name] check in on this. We are using silver collagen in the meantime the wounds look better 2/10; tolerating IV vancomycin and Rocephin. We are waiting [name] apply for Oasis. Although I am not really sure where he is in his out-of-pocket max. 2/17 started the first application of Oasis trilayer. Still on antibiotics. The wounds have generally look better. The area on the left has a little more surface slough requiring debridement 8/92; second application of Oasis trilayer. The wound surface granulation is generally look better. The area on the left with undermining laterally I think is come in a bit. 10/08/2020 upon evaluation today patient is here today for [employer] application #3. Fortunately he seems [name] be doing extremely well with regard [name] this and we are seeing a lot of new epithelial growth which is great news. Fortunately there is no  signs of active infection at this time. 10/16/2020 upon evaluation today patient appears [name] be doing well with regard [name] his foot ulcers. Do believe the Oasis has been of benefit for him. I do not see any signs of infection right now which is great news and I think that he has a lot of new epithelial growth which is great [name] see as well. The patient is very pleased [name] hear all of this. I do think we can proceed with the Oasis trilayer #4 today. 3/18; not as much improvement in these areas on his heels that I was hoping. I did reapply trilateral Oasis today the tissue looks healthier but not as much fill in as I was hoping. 3/25; better looking today I think this is come in a bit the tissue looks healthier. Triple layer Oasis reapplied #6 4/1; somewhat better looking definitely better looking surface not as much change in surface area as I was hoping. He may be spending more time [place] on days then he needs [name] although he does have heel offloading boots. Triple layer Oasis reapplied #7 4/7; unfortunately apparently [facility] will not approve any further Oasis which is unfortunate since the patient did respond nicely both in terms of the condition of the wound bed as well as surface area. There is still some drainage coming from the wound but not a lot there does not appear [name] be any infection 4/15; we have been using Hydrofera Blue. He continues [name] have nice rims of epithelialization on the right greater than the left. The left the epithelialization is coming from the tip of his heel. There is moderate drainage. In this  that concerns me about a total contact cast. There is no evidence of infection 4/29; patient has been using Hydrofera Blue with dressing changes. He has no complaints or issues today. 5/5; using Hydrofera Blue. I actually think that he looks marginally better than the last time I saw this 3 weeks ago. There are rims of epithelialization on the left thumb coming from the  medial side on the right. Using Hydrofera Blue 5/12; using Hydrofera Blue. These continue [name] make improvements in surface area. His drainage was not listed as severe I therefore went ahead and put a cast on the left foot. Right foot we will continue [name] dress his previous 5/16; back for first total contact cast change. He did not tolerate this particularly well cast injury on the anterior tibia among other issues. Difficulty sleeping. I talked him about this in some detail and afterwards is elected [name] continue. I told him I would like [name] have a cast on for 3 weeks [name] see if this is going [name] help at all. I think he agreed 5/19; I think the wound is better. There is no tunneling towards his midfoot. The undermining medially also looks better. He has a rim of new skin distally. I think we are making progress here. The area on the left also continues [name] look somewhat better [name] me using Hydrofera Blue. He has a list of complaints about the cast but none of them seem serious 5/26; patient presents for 1 week follow-up. He has been using a total contact cast and tolerating this well. Hydrofera Blue is the main dressing used. He denies signs of infection. 6/2 Hydrofera Blue total contact cast on the left. These were large ulcers that formed in intensive care unit where the patient was recovering from [place]. May have had something to do with being ventilated in an upright positiono Pressors etc. We have been able [name] get the areas down considerably and a viable surface. There is some epithelialization in both sides. Note made of drainage 6/9; changed [name] [facility] last time because of drainage. He arrives with better looking surfaces and dimensions on the left than the right. Paradoxically the right actually probes more towards his midfoot the left is largely close down but both of these look improved. Using a total contact cast on the left 6/16; complex wounds on his bilateral plantar heels which were  initially pressure injury from a stay in the ICU with COVID. We have been using silver alginate most recently. His dimensions of come in quite dramatically however not recently. We have been putting the left foot in a total contact cast 6/23; complex wounds on the bilateral plantar heels. I been putting the left in the cast paradoxically the area on the right is the one that is going towards closure at a faster rate. Quite a bit of drainage on the left. The patient went [name] see Dr. Amalia Hailey who said he was going [name] standby for skin grafts. I had actually considered sending him for skin grafts however he would be mandatorily off his feet for a period of weeks [name] months. I am thinking that the area on the right is going [name] close on its own the area on the left has been more stubborn even though we have him in a total contact cast 6/30; took him out of a total contact cast last week is the right heel seem [name] be making better progress than the left where I was placing the cast. We are  using silver alginate. Both wounds are smaller right greater than left 7/12; both wounds look as though they are making some progress. We are using silver alginate. Heel offloading boots 7/26; very gradual progress especially on the right. Using silver alginate. He is wearing heel offloading boots 8/18; he continues [name] close these wounds down very gradually. Using silver alginate. The problem polymen being definitive about this is areas of what appears [name] be callus around the margins. This is not a 100% of the area but certainly sizable especially on the right 9/1; bilateral plantar feet wounds secondary [name] prolonged pressure while being ventilated for COVID-19 in an upright position. Essentially pressure ulcers on the bottom of his feet. He is made substantial progress using silver alginate. 9/14; bilateral plantar feet wounds secondary [name] prolonged pressure. Making progress using silver alginate. 9/29 bilateral plantar feet  wounds secondary [name] prolonged pressure. I changed him [name] Iodoflex last week. MolecuLight showing reddened blush fluorescence 10/11; patient presents for follow-up. He has no issues or complaints today. He denies signs of infection. He continues [name] use Iodoflex and antibiotic ointment [name] the wound beds. 10/27; 2-week follow-up. No evidence of infection. He has callus and thick dry skin around the wound margins we have been using Iodoflex and Bactroban which was in response [name] a moderate left MolecuLight reddish blush fluorescence. 11/10; 2-week follow-up. Wound margins again have thick callus however the measurements of the actual wound sites are a lot smaller. Everything looks reasonably healthy here. We have been using Iodoflex He was approved for prime matrix but I have elected [name] delay this given the improvement in the surface area. Hopefully I will not regret that decision as were getting close [name] the end of the year in terms of insurance payment 12/8; 2-week follow-up. Wounds are generally smaller in size. These were initially substantial wounds extending into the forefoot all the way into the heel on the bilateral plantar feet. They are now both located on the plantar heel distal aspect both of these have a lot of callus around the wounds I used a #5 curette [name] remove this on the right and the left also some subcutaneous debris [name] try and get the wound edges were using Iodoflex. He has heel offloading shoe 12/22; 2-week follow-up. Not really much improvement. He has thick callus around the outer edges of both wounds. I remove this there is some nonviable subcutaneous tissue as well. We have been using Iodoflex. Her intake nurse and myself spontaneously thought of a total contact cast I went back in May. At that time we really were not seeing much of an improvement with a cast although the wound was in a much different situation I would like [name] retry this in 2 weeks and I discussed this with the  patient 08/12/2021; the patient has had some improvement with the Iodoflex. The the area on the left heel plantar more improved than the right. I had [name] put him in a total contact cast on the left although I decided [name] put that off for 2 weeks. I am going [name] change his primary dressing [name] silver collagen. I think in both areas he has had some improvement most of the healing seems [name] be more proximal in the heel. The wounds are in the mid aspect. A lot of thick callus on the right heel however. 1/19; we are using silver collagen on both plantar heel areas. He has had some improvement today. The left did not require any debridement. He  still had some eschar on the right that was debrided but both seem [name] have contracted. I did not put it total contact cast on him today 2/2 we have been using silver collagen. The area on the right plantar heel has areas that appear [name] be epithelialized interspersed with dry flaking callus and dry skin. I removed this. This really looks better than on the other side. On the left still a large area with raised edges and debris on the surface. 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 He has no issues or complaints today. 2/9; not much change in the left foot wound with 1 week of a cast we are using silver collagen. Silver collagen on the right side. The right side has been the better wound surface. We will reapply the total contact cast on the left 2/16; not much improvement on either side I been using silver collagen with a total contact cast on the left. I'm changing the Hydrofera Blue still with a total contact cast on the left 2/23; some improvement on both sides. Disappointing that he has thick callus around the area that we are putting in a total contact cast on the left. We've been using Hydrofera Blue on both wound areas. This is a  man who at essentially pressure ulcers in addition [name] ischemia caused by medications [name] support his blood pressure (pressors) in the ICU. He was being ventilated in the standing position for severe Covid. A Shiley the wounds extended across his entire foot but are now localized [name] his plantar heels bilaterally. We have made progress however neither areas healed. I continue [name] think the total contact cast is helped albeit painstakingly slowly. He has never wanted a plastic surgery consult although I don't know that they would be interested in grafting in area in this location. 10/07/2021: Continued improvement bilaterally. There is still some callus around the left wound, despite the total contact cast. He has some increased pain in his right midfoot around 1 particular area. This has been painful in the past but seems [name] be a little bit worse. When his cast was removed today, there was an area on the heel of the left foot that looks a bit macerated. He is also complaining of pain in his left thigh and hip which he thinks is secondary [name] the limb length discrepancy caused by the cast. Patient History Information obtained from Patient. Family History Cancer - Maternal Grandparents, Diabetes - Father,Paternal Grandparents, Heart Disease - Maternal Grandparents, Hypertension - Father,Paternal Grandparents, Lung Disease - Siblings, No family history of Hereditary Spherocytosis, Kidney Disease, Seizures, Stroke, Thyroid Problems, Tuberculosis. Social History Never smoker, Marital Status - Married, Alcohol Use - Never, Drug Use - No History, Caffeine Use - Daily - tea, soda. Medical History Eyes Denies history of Cataracts, Glaucoma, Optic Neuritis Ear/Nose/Mouth/Throat Denies history of Chronic sinus problems/congestion, Middle ear problems Hematologic/Lymphatic Denies history of Anemia, Hemophilia, Human Immunodeficiency Virus, Lymphedema, Sickle Cell Disease Respiratory Patient has history of  Asthma Denies history of Aspiration, Chronic Obstructive Pulmonary Disease (COPD), Pneumothorax, Sleep Apnea, Tuberculosis Cardiovascular Patient has history of Angina - with COVID, Hypertension Denies history of Arrhythmia, Congestive Heart Failure, Coronary Artery Disease, Deep Vein Thrombosis, Hypotension, Myocardial Infarction, Peripheral Arterial Disease, Peripheral Venous Disease, Phlebitis, Vasculitis Gastrointestinal Denies history of Cirrhosis , Colitis, Crohnoos, Hepatitis A, Hepatitis B, Hepatitis C Endocrine Denies history  of Type I Diabetes, Type II Diabetes Genitourinary Denies history of End Stage Renal Disease Immunological Denies history of Lupus Erythematosus, Raynaudoos, Scleroderma Integumentary (Skin) Denies history of History of Burn Musculoskeletal Denies history of Gout, Rheumatoid Arthritis, Osteoarthritis, Osteomyelitis Neurologic Denies history of Dementia, Neuropathy, Quadriplegia, Paraplegia, Seizure Disorder Oncologic Denies history of Received Chemotherapy, Received Radiation Psychiatric Denies history of Anorexia/bulimia, Confinement Anxiety Hospitalization/Surgery History - COVID PNA 07/22/2019- 11/14/2019. - 03/27/2020 wound debridement/ skin graft. Medical A Surgical History Notes nd Constitutional Symptoms (General Health) COVID PNA 07/22/2019-11/14/2019 [facility], foot drop left foot , Genitourinary kidney stone Psychiatric anxiety Objective Constitutional He is hypertensive. He is obese. No acute distress. Vitals Time Taken: 9:44 AM, Height: 69 in, Weight: 280 lbs, BMI: 41.3, Temperature: 98.7 F, Pulse: 73 bpm, Respiratory Rate: 18 breaths/min, Blood Pressure: 154/90 mmHg. General Notes: 10/07/2021: Wound examooof the left foot, which is in a total contact cast shows continued improvement. There is some maceration of the skin on the posterior heel. There is still some callus forming, despite the cast. On the right, and the area that he is  complaining of more pain, I do not see any evidence of infection. I removed the callus and debris from this site and cannot identify an appreciable source for his discomfort. Integumentary (Hair, Skin) Wound #1 status is Open. Original cause of wound was Pressure Injury. The date acquired was: 10/07/2019. The wound has been in treatment 64 weeks. The wound is located on the Right Calcaneus. The wound measures 2.1cm length x 0.6cm width x 0.2cm depth; 0.99cm^2 area and 0.198cm^3 volume. There is Fat Layer (Subcutaneous Tissue) exposed. There is a medium amount of serosanguineous drainage noted. There is medium (34-66%) pink granulation within the wound bed. There is a medium (34-66%) amount of necrotic tissue within the wound bed including Adherent Slough. Wound #2 status is Open. Original cause of wound was Pressure Injury. The date acquired was: 10/07/2019. The wound has been in treatment 64 weeks. The wound is located on the Left Calcaneus. The wound measures 2.5cm length x 0.5cm width x 0.2cm depth; 0.982cm^2 area and 0.196cm^3 volume. There is Fat Layer (Subcutaneous Tissue) exposed. There is a medium amount of serosanguineous drainage noted. There is large (67-100%) red granulation within the wound bed. There is a small (1-33%) amount of necrotic tissue within the wound bed including Adherent Slough. Assessment Active Problems ICD-10 Non-pressure chronic ulcer of other part of right foot with fat layer exposed Non-pressure chronic ulcer of other part of left foot with fat layer exposed Procedures Wound #1 Pre-procedure diagnosis of Wound #1 is a Pressure Ulcer located on the Right Calcaneus . There was a Excisional Skin/Subcutaneous Tissue Debridement with a total area of 1.26 sq cm performed by Fredirick Maudlin, MD. With the following instrument(s): Curette [name] remove Non-Viable tissue/material. Material removed includes Callus, Subcutaneous Tissue, and Slough after achieving pain control using  Lidocaine 5% topical ointment. No specimens were taken. A time out was conducted at 10:17, prior [name] the start of the procedure. A Minimum amount of bleeding was controlled with Pressure. The procedure was tolerated well with a pain level of 1 throughout and a pain level of 1 following the procedure. Post Debridement Measurements: 2.1cm length x 0.6cm width x 0.2cm depth; 0.198cm^3 volume. Post debridement Stage noted as Category/Stage III. Character of Wound/Ulcer Post Debridement is improved. Post procedure Diagnosis Wound #1: Same as Pre-Procedure Wound #2 Pre-procedure diagnosis of Wound #2 is a Pressure Ulcer located on the Left Calcaneus . There  was a Excisional Skin/Subcutaneous Tissue Debridement with a total area of 1.25 sq cm performed by Fredirick Maudlin, MD. With the following instrument(s): Curette [name] remove Non-Viable tissue/material. Material removed includes Callus, Subcutaneous Tissue, and Slough after achieving pain control using Lidocaine 5% topical ointment. No specimens were taken. A time out was conducted at 10:17, prior [name] the start of the procedure. A Minimum amount of bleeding was controlled with Pressure. The procedure was tolerated well with a pain level of 1 throughout and a pain level of 1 following the procedure. Post Debridement Measurements: 2.5cm length x 0.5cm width x 0.2cm depth; 0.196cm^3 volume. Post debridement Stage noted as Category/Stage III. Character of Wound/Ulcer Post Debridement is improved. Post procedure Diagnosis Wound #2: Same as Pre-Procedure Plan Follow-up Appointments: Return Appointment in 1 week. - Dr [name] Thursday Bathing/ Shower/ Hygiene: May shower with protection but do not get wound dressing(s) wet. - Cover with cast protector (can purchase cast protector at [facility] or [facility] ) Edema Control - Lymphedema / SCD / Other: Elevate legs [name] the level of the heart or above for 30 minutes daily and/or when sitting, a frequency of: - throughout  the day Avoid standing for long periods of time. Moisturize legs daily. - right leg and foot every night. Off-Loading: T Contact Cast [name] Left Lower Extremity otal Wedge shoe [name]: - Glophed Shoe [name] right foot Other: - keep pressure off of the bottom of your feet Additional Orders / Instructions: Follow Nutritious Diet WOUND #1: - Calcaneus Wound Laterality: Right Cleanser: Normal Saline (Generic) Every Other Day/30 Days Discharge Instructions: Cleanse the wound with Normal Saline prior [name] applying a clean dressing using gauze sponges, not tissue or cotton balls. Cleanser: Wound Cleanser Every Other Day/30 Days Discharge Instructions: Cleanse the wound with wound cleanser prior [name] applying a clean dressing using gauze sponges, not tissue or cotton balls. Prim Dressing: Hydrofera Blue Classic Foam, 2x2 in Every Other Day/30 Days ary Discharge Instructions: Moisten with saline prior [name] applying [name] wound bed Secondary Dressing: Zetuvit Plus 4x8 in (Generic) Every Other Day/30 Days Discharge Instructions: Apply over primary dressing as directed. Secured With: Elastic Bandage 4 inch (ACE bandage) (Generic) Every Other Day/30 Days Discharge Instructions: Secure with ACE bandage as directed. Secured With: [employer], 4.5x3.1 (in/yd) (Generic) Every Other Day/30 Days Discharge Instructions: Secure with Kerlix as directed. WOUND #2: - Calcaneus Wound Laterality: Left Cleanser: Normal Saline (Generic) 1 x Per Week/30 Days Discharge Instructions: Cleanse the wound with Normal Saline prior [name] applying a clean dressing using gauze sponges, not tissue or cotton balls. Cleanser: Wound Cleanser 1 x Per Week/30 Days Discharge Instructions: Cleanse the wound with wound cleanser prior [name] applying a clean dressing using gauze sponges, not tissue or cotton balls. Peri-Wound Care: Zinc Oxide Ointment 30g tube 1 x Per Week/30 Days Discharge Instructions: Apply Zinc Oxide [name] periwound with each dressing  change Prim Dressing: Hydrofera Blue Classic Foam, 2x2 in 1 x Per Week/30 Days ary Discharge Instructions: Moisten with saline prior [name] applying [name] wound bed Secondary Dressing: Zetuvit Plus 4x8 in (Generic) 1 x Per Week/30 Days Discharge Instructions: Apply over primary dressing as directed. Secured With: [employer], 4.5x3.1 (in/yd) (Generic) 1 x Per Week/30 Days Discharge Instructions: Secure with Kerlix as directed. Secured With: 79M Medipore H Soft Cloth Surgical T ape, 4 x 10 (in/yd) 1 x Per Week/30 Days Discharge Instructions: Secure with tape as directed. 10/07/2021: Overall, the wounds continue [name] heal, despite the difficult location. I think the  total contact casting has really been beneficial, despite his complaints. I wonder if there is a way [name] build up the sole of his offloading shoe on the right so that there is less of a limb length discrepancy. No evidence of infection in the right foot, despite his concern for pain. Continue Hydrofera Blue on both sides, with total contact cast on the left and offloading shoe on the right. We will apply zinc oxide ointment [name] the heel on the left [name] try and eliminate the maceration factor. I will see him in a week. Electronic Signature(s) Signed: 10/07/2021 10:39:47 AM By: Fredirick Maudlin MD FACS Entered By: Fredirick Maudlin on 10/07/2021 10:39:47 -------------------------------------------------------------------------------- HxROS Details Patient Name: Date of Service: [name], [name] 10/07/2021 9:45 A M Medical Record Number: [id number] Patient Account Number: [id number] Date of Birth/Sex: Treating RN: Jul 16, 1976 (46 y.o. M) Primary Care Provider: [name] Other Clinician: Referring Provider: Treating Provider/Extender: [name] in Treatment: 43 Information Obtained From Patient Constitutional Symptoms (General Health) Medical History: Past Medical History Notes: COVID PNA 07/22/2019-11/14/2019 [facility], foot drop left foot , Eyes Medical History: Negative for: Cataracts; Glaucoma; Optic Neuritis Ear/Nose/Mouth/Throat Medical History: Negative for: Chronic sinus problems/congestion; Middle ear problems Hematologic/Lymphatic Medical History: Negative for: Anemia; Hemophilia; Human Immunodeficiency Virus; Lymphedema; Sickle Cell Disease Respiratory Medical History: Positive for: Asthma Negative for: Aspiration; Chronic Obstructive Pulmonary Disease (COPD); Pneumothorax; Sleep Apnea; Tuberculosis Cardiovascular Medical History: Positive for: Angina - with COVID; Hypertension Negative for: Arrhythmia; Congestive Heart Failure; Coronary Artery Disease; Deep Vein Thrombosis; Hypotension; Myocardial Infarction; Peripheral Arterial Disease; Peripheral Venous Disease; Phlebitis; Vasculitis Gastrointestinal Medical History: Negative for: Cirrhosis ; Colitis; Crohns; Hepatitis A; Hepatitis B; Hepatitis C Endocrine Medical History: Negative for: Type I Diabetes; Type II Diabetes Genitourinary Medical History: Negative for: End Stage Renal Disease Past Medical History Notes: kidney stone Immunological Medical History: Negative for: Lupus Erythematosus; Raynauds; Scleroderma Integumentary (Skin) Medical History: Negative for: History of Burn Musculoskeletal Medical History: Negative for: Gout; Rheumatoid Arthritis; Osteoarthritis; Osteomyelitis Neurologic Medical History: Negative for: Dementia; Neuropathy; Quadriplegia; Paraplegia; Seizure Disorder Oncologic Medical History: Negative for: Received Chemotherapy; Received Radiation Psychiatric Medical History: Negative for: Anorexia/bulimia; Confinement Anxiety Past Medical History Notes: anxiety Immunizations Pneumococcal Vaccine: Received Pneumococcal Vaccination: No Implantable Devices None Hospitalization / Surgery History Type of Hospitalization/Surgery COVID PNA 07/22/2019- 11/14/2019 03/27/2020 wound  debridement/ skin graft Family and Social History Cancer: Yes - Maternal Grandparents; Diabetes: Yes - Father,Paternal Grandparents; Heart Disease: Yes - Maternal Grandparents; Hereditary Spherocytosis: No; Hypertension: Yes - Father,Paternal Grandparents; Kidney Disease: No; Lung Disease: Yes - Siblings; Seizures: No; Stroke: No; Thyroid Problems: No; Tuberculosis: No; Never smoker; Marital Status - Married; Alcohol Use: Never; Drug Use: No History; Caffeine Use: Daily - tea, soda; Financial Concerns: No; Food, Clothing or Shelter Needs: No; Support System Lacking: No; Transportation Concerns: No Physician Affirmation I have reviewed and agree with the above information. Electronic Signature(s) Signed: 10/07/2021 10:40:57 AM By: Fredirick Maudlin MD FACS Entered By: Fredirick Maudlin on 10/07/2021 10:34:01 -------------------------------------------------------------------------------- Total Contact Cast Details Patient Name: Date of Service: [name], [name] 10/07/2021 9:45 A M Medical Record Number: [id number] Patient Account Number: [id number] Date of Birth/Sex: Treating RN: Jan 28, 1976 (46 y.o. [name] Primary Care Provider: [name] Other Clinician: Referring Provider: Treating Provider/Extender: [name] in Treatment: 4 T Contact Cast Applied for Wound Assessment: otal Wound #2 Left Calcaneus Performed By: Physician Fredirick Maudlin, MD Post Procedure Diagnosis Same as Pre-procedure Electronic Signature(s) Signed:  10/07/2021 11:11:00 AM By: Fredirick Maudlin MD FACS Signed: 10/07/2021 12:06:17 PM By: [name] Entered By: [name] on 10/07/2021 10:45:06 -------------------------------------------------------------------------------- SuperBill Details Patient Name: Date of Service: [name], [place]. 10/07/2021 Medical Record Number: [id number] Patient Account Number: [id number] Date of Birth/Sex: Treating RN: August 12, 1975 (46  y.o. M) Primary Care Provider: [name] Other Clinician: Referring Provider: Treating Provider/Extender: [name] in Treatment: 64 Diagnosis Coding ICD-10 Codes Code Description [phone] Non-pressure chronic ulcer of other part of right foot with fat layer exposed L97.522 Non-pressure chronic ulcer of other part of left foot with fat layer exposed Facility Procedures CPT4 Code: [id number] Description: [place] - DEB SUBQ TISSUE 20 SQ CM/< ICD-10 Diagnosis Description L97.512 Non-pressure chronic ulcer of other part of right foot with fat layer exposed L97.522 Non-pressure chronic ulcer of other part of left foot with fat layer exposed Modifier: Quantity: 1 Physician Procedures : CPT4 Code Description Modifier [id number] - WC PHYS SUBQ TISS 20 SQ CM 1 ICD-10 Diagnosis Description L97.512 Non-pressure chronic ulcer of other part of right foot with fat layer exposed L97.522 Non-pressure chronic ulcer of other part of left foot  with fat layer exposed Quantity: Electronic Signature(s) Signed: 10/07/2021 10:40:16 AM By: Fredirick Maudlin MD FACS Entered By: Fredirick Maudlin on 10/07/2021 10:40:15

## 2021-10-08 NOTE — Progress Notes (Signed)
[name] ([id number]) Visit Report for 10/07/2021 Arrival Information Details Patient Name: Date of Service: [name], [place] 10/07/2021 9:45 A M Medical Record Number: [id number] Patient Account Number: [id number] Date of Birth/Sex: Treating RN: Mar 19, 1976 (46 y.o. M) Primary Care [name]: [name] Other Clinician: Referring [name]: Treating [name]/Extender: [name] in [name]: 25 Visit Information History Since Last Visit Added or deleted any medications: No Patient Arrived: Wheel Chair Any new allergies or adverse reactions: No Arrival Time: 09:43 Had a fall or experienced change in No Accompanied By: wife activities of daily living that may affect Transfer Assistance: None risk of falls: Patient Identification Verified: Yes Signs or symptoms of abuse/neglect since last visito No Secondary Verification Process Completed: Yes Hospitalized since last visit: No Patient Requires Transmission-Based Precautions: No Implantable device outside of the clinic excluding No Patient Has Alerts: No cellular tissue based products placed in the center since last visit: Has Dressing in Place as Prescribed: Yes Pain Present Now: Yes Electronic Signature(s) Signed: 10/08/2021 1:10:25 PM By: Sandre Kitty Entered By: Sandre Kitty on 10/07/2021 09:44:49 -------------------------------------------------------------------------------- Encounter Discharge Information Details Patient Name: Date of Service: [name], [place]. 10/07/2021 9:45 A M Medical Record Number: [id number] Patient Account Number: [id number] Date of Birth/Sex: Treating RN: Dec 27, 1975 (46 y.o. [name] Primary Care [name]: [name] Other Clinician: Referring Dennice Tindol: Treating [name]: 53 Encounter Discharge Information Items Post Procedure Vitals Discharge Condition: Stable Temperature (F):  98.7 Ambulatory Status: Wheelchair Pulse (bpm): 73 Discharge Destination: Home Respiratory Rate (breaths/min): 18 Transportation: Private Auto Blood Pressure (mmHg): 154/90 Accompanied By: spouse Schedule Follow-up Appointment: Yes Clinical Summary of Care: Patient Declined Electronic Signature(s) Signed: 10/07/2021 12:06:17 PM By: [name] Entered By: [name] on 10/07/2021 11:58:21 -------------------------------------------------------------------------------- Lower Extremity Assessment Details Patient Name: Date of Service: [name], [place] 10/07/2021 9:45 A M Medical Record Number: [id number] Patient Account Number: [id number] Date of Birth/Sex: Treating RN: 1976/01/10 (46 y.o. [name] Primary Care Davey Limas: [name] Other Clinician: Referring Venesa Semidey: Treating Devany Aja/Extender: [name] in [name]: 64 Edema Assessment Assessed: [name]: No] [name]: No] Edema: [Left: No] [Right: No] Calf Left: Right: Point of Measurement: 29 cm From Medial Instep 44 cm 43.1 cm Ankle Left: Right: Point of Measurement: 9 cm From Medial Instep 25.5 cm 25.2 cm Electronic Signature(s) Signed: 10/07/2021 12:06:17 PM By: [name] Entered By: [name] on 10/07/2021 10:08:34 -------------------------------------------------------------------------------- Multi Wound Chart Details Patient Name: Date of Service: [name], [name] 10/07/2021 9:45 A M Medical Record Number: [id number] Patient Account Number: [id number] Date of Birth/Sex: Treating RN: [birth year] (46 y.o. M) Primary Care Miqueas Whilden: [name] Other Clinician: Referring Andromeda Poppen: Treating Christon Gallaway/Extender: [name] in [name]: 38 Vital Signs Height(in): 69 Pulse(bpm): 39 Weight(lbs): 280 Blood Pressure(mmHg): 154/90 Body Mass Index(BMI): 41.3 Temperature(F): 98.7 Respiratory Rate(breaths/min): 18 Photos:  [N/A:N/A] Right Calcaneus Left Calcaneus N/A Wound Location: Pressure Injury Pressure Injury N/A Wounding Event: Pressure Ulcer Pressure Ulcer N/A Primary Etiology: Asthma, Angina, Hypertension Asthma, Angina, Hypertension N/A Comorbid History: 10/07/2019 10/07/2019 N/A Date Acquired: 64 64 N/A Weeks of [name]: Open Open N/A Wound Status: No No N/A Wound Recurrence: 2.1x0.6x0.2 2.5x0.5x0.2 N/A Measurements L x W x D (cm) 0.99 0.982 N/A A (cm) : rea 0.198 0.196 N/A Volume (cm) : 96.90% 96.40% N/A % Reduction in A rea: 99.40% 99.30% N/A % Reduction in Volume: Category/Stage III Category/Stage III N/A Classification: Medium  Medium N/A Exudate A mount: Serosanguineous Serosanguineous N/A Exudate Type: red, brown red, brown N/A Exudate Color: Medium (34-66%) Large (67-100%) N/A Granulation A mount: Pink Red N/A Granulation Quality: Medium (34-66%) Small (1-33%) N/A Necrotic A mount: Fat Layer (Subcutaneous Tissue): Yes Fat Layer (Subcutaneous Tissue): Yes N/A Exposed Structures: Fascia: No Fascia: No Tendon: No Tendon: No Muscle: No Muscle: No Joint: No Joint: No Bone: No Bone: No Small (1-33%) None N/A Epithelialization: Debridement - Excisional Debridement - Excisional N/A Debridement: Pre-procedure Verification/Time Out 10:17 10:17 N/A Taken: Lidocaine 5% topical ointment Lidocaine 5% topical ointment N/A Pain Control: Callus, Subcutaneous, Slough Callus, Subcutaneous, Slough N/A Tissue Debrided: Skin/Subcutaneous Tissue Skin/Subcutaneous Tissue N/A Level: 1.26 1.25 N/A Debridement A (sq cm): rea Curette Curette N/A Instrument: Minimum Minimum N/A Bleeding: Pressure Pressure N/A Hemostasis A chieved: 1 1 N/A Procedural Pain: 1 1 N/A Post Procedural Pain: Procedure was tolerated well Procedure was tolerated well N/A Debridement [name] Response: 2.1x0.6x0.2 2.5x0.5x0.2 N/A Post Debridement Measurements L x W x D (cm) 0.198 0.196  N/A Post Debridement Volume: (cm) Category/Stage III Category/Stage III N/A Post Debridement Stage: Debridement Debridement N/A Procedures Performed: [name] Notes Electronic Signature(s) Signed: 10/07/2021 10:30:22 AM By: Fredirick Maudlin MD FACS Entered By: Fredirick Maudlin on 10/07/2021 10:30:22 -------------------------------------------------------------------------------- Multi-Disciplinary Care Plan Details Patient Name: Date of Service: [name], [name] 10/07/2021 9:45 A M Medical Record Number: [id number] Patient Account Number: [id number] Date of Birth/Sex: Treating RN: 11-Oct-1975 (46 y.o. [name] Primary Care Dailynn Nancarrow: [name] Other Clinician: Referring Reise Hietala: Treating Emaya Preston/Extender: [name] in [name]: 39 Multidisciplinary Care Plan reviewed with physician Active Inactive Abuse / Safety / Falls / Self Care Management Nursing Diagnoses: Potential for falls Goals: Patient/caregiver will verbalize/demonstrate measures taken to prevent injury and/or falls Date Initiated: 07/15/2020 Target Resolution Date: 10/15/2021 Goal Status: Active Interventions: Assess fall risk on admission and as needed Assess impairment of mobility on admission and as needed per policy Notes: Wound/Skin Impairment Nursing Diagnoses: Impaired tissue integrity Knowledge deficit related to ulceration/compromised skin integrity Goals: Patient/caregiver will verbalize understanding of skin care regimen Date Initiated: 07/15/2020 Target Resolution Date: 10/15/2021 Goal Status: Active Ulcer/skin breakdown will have a volume reduction of 30% by week 4 Date Initiated: 07/15/2020 Date Inactivated: 08/20/2020 Target Resolution Date: 09/03/2020 Goal Status: Unmet Unmet Reason: no major changes. Ulcer/skin breakdown will heal within 14 weeks Date Initiated: 12/04/2020 Date Inactivated: 12/10/2020 Target Resolution Date: 12/10/2020 Unmet Reason: wounds  still open at 14 Goal Status: Unmet weeks and today 21 weeks. Interventions: Assess patient/caregiver ability to obtain necessary supplies Assess patient/caregiver ability to perform ulcer/skin care regimen upon admission and as needed Assess ulceration(s) every visit Provide education on ulcer and skin care [name] Activities: Skin care regimen initiated : 07/15/2020 Topical wound management initiated : 07/15/2020 Notes: Electronic Signature(s) Signed: 10/07/2021 5:54:42 PM By: Levan Hurst RN, BSN Entered By: Levan Hurst on 10/07/2021 09:56:59 -------------------------------------------------------------------------------- Pain Assessment Details Patient Name: Date of Service: [name], [place] 10/07/2021 9:45 A M Medical Record Number: [id number] Patient Account Number: [id number] Date of Birth/Sex: Treating RN: 1975/10/13 (46 y.o. M) Primary Care Solace Wendorff: [name] Other Clinician: Referring Haileyann Staiger: Treating Kaylenn Civil/Extender: [name] in [name]: 26 Active Problems Location of Pain Severity and Description of Pain Patient Has Paino Yes Site Locations Rate the pain. Rate the pain. Current Pain Level: 7 Pain Management and Medication Current Pain Management: Electronic Signature(s) Signed: 10/08/2021 1:10:25 PM By: Sandre Kitty Entered By: Sandre Kitty on 10/07/2021 09:45:25 --------------------------------------------------------------------------------  Patient/Caregiver Education Details Patient Name: Date of Service: [name] 3/2/2023andnbsp9:45 [place] Record Number: [id number] Patient Account Number: [id number] Date of Birth/Gender: Treating RN: 1976/08/06 (46 y.o. [name] Primary Care Physician: [name] Other Clinician: Referring Physician: Treating Physician/Extender: [name] in [name]: 40 Education Assessment Education Provided To: Patient Education  Topics Provided Wound/Skin Impairment: Methods: Explain/Verbal Responses: State content correctly [employer]) Signed: 10/07/2021 5:54:42 PM By: Levan Hurst RN, BSN Entered By: Levan Hurst on 10/07/2021 09:57:12 -------------------------------------------------------------------------------- Wound Assessment Details Patient Name: Date of Service: [name], [place] 10/07/2021 9:45 A M Medical Record Number: [id number] Patient Account Number: [id number] Date of Birth/Sex: Treating RN: 25-Sep-1975 (46 y.o. M) Primary Care Arseniy Toomey: [name] Other Clinician: Referring Jethro Radke: Treating Mayli Covington/Extender: [name] in [name]: 64 Wound Status Wound Number: 1 Primary Etiology: Pressure Ulcer Wound Location: Right Calcaneus Wound Status: Open Wounding Event: Pressure Injury Comorbid History: Asthma, Angina, Hypertension Date Acquired: 10/07/2019 Weeks Of [name]: 64 Clustered Wound: No Photos Wound Measurements Length: (cm) 2.1 Width: (cm) 0.6 Depth: (cm) 0.2 Area: (cm) 0.99 Volume: (cm) 0.198 % Reduction in Area: 96.9% % Reduction in Volume: 99.4% Epithelialization: Small (1-33%) Wound Description Classification: Category/Stage III Exudate Amount: Medium Exudate Type: Serosanguineous Exudate Color: red, brown Foul Odor After Cleansing: No Slough/Fibrino Yes Wound Bed Granulation Amount: Medium (34-66%) Exposed Structure Granulation Quality: Pink Fascia Exposed: No Necrotic Amount: Medium (34-66%) Fat Layer (Subcutaneous Tissue) Exposed: Yes Necrotic Quality: Adherent Slough Tendon Exposed: No Muscle Exposed: No Joint Exposed: No Bone Exposed: No [name] Notes Wound #1 (Calcaneus) Wound Laterality: Right Cleanser Normal Saline Discharge Instruction: Cleanse the wound with Normal Saline prior to applying a clean dressing using gauze sponges, not tissue or cotton balls. Wound Cleanser Discharge Instruction: Cleanse  the wound with wound cleanser prior to applying a clean dressing using gauze sponges, not tissue or cotton balls. Peri-Wound Care Topical Primary Dressing Hydrofera Blue Classic Foam, 2x2 in Discharge Instruction: Moisten with saline prior to applying to wound bed Secondary Dressing Zetuvit Plus 4x8 in Discharge Instruction: Apply over primary dressing as directed. Secured With Elastic Bandage 4 inch (ACE bandage) Discharge Instruction: Secure with ACE bandage as directed. Kerlix Roll Sterile, 4.5x3.1 (in/yd) Discharge Instruction: Secure with Kerlix as directed. Compression Wrap Compression Stockings Add-Ons Electronic Signature(s) Signed: 10/08/2021 1:10:25 PM By: Sandre Kitty Entered By: Sandre Kitty on 10/07/2021 09:55:32 -------------------------------------------------------------------------------- Wound Assessment Details Patient Name: Date of Service: [name], [place] 10/07/2021 9:45 A M Medical Record Number: [id number] Patient Account Number: [id number] Date of Birth/Sex: Treating RN: November 30, 1975 (46 y.o. M) Primary Care Jehieli Brassell: [name] Other Clinician: Referring Jahmeer Porche: Treating Martita Brumm/Extender: [name] in [name]: 64 Wound Status Wound Number: 2 Primary Etiology: Pressure Ulcer Wound Location: Left Calcaneus Wound Status: Open Wounding Event: Pressure Injury Comorbid History: Asthma, Angina, Hypertension Date Acquired: 10/07/2019 Weeks Of [name]: 64 Clustered Wound: No Photos Wound Measurements Length: (cm) 2.5 Width: (cm) 0.5 Depth: (cm) 0.2 Area: (cm) 0.982 Volume: (cm) 0.196 % Reduction in Area: 96.4% % Reduction in Volume: 99.3% Epithelialization: None Wound Description Classification: Category/Stage III Exudate Amount: Medium Exudate Type: Serosanguineous Exudate Color: red, brown Wound Bed Granulation Amount: Large (67-100%) Exposed Structure Granulation Quality: Red Fascia Exposed:  No Necrotic Amount: Small (1-33%) Fat Layer (Subcutaneous Tissue) Exposed: Yes Necrotic Quality: Adherent Slough Tendon Exposed: No Muscle Exposed: No Joint Exposed: No Bone Exposed: No [name] Notes Wound #2 (Calcaneus) Wound Laterality: Left Cleanser Normal  Saline Discharge Instruction: Cleanse the wound with Normal Saline prior to applying a clean dressing using gauze sponges, not tissue or cotton balls. Wound Cleanser Discharge Instruction: Cleanse the wound with wound cleanser prior to applying a clean dressing using gauze sponges, not tissue or cotton balls. Peri-Wound Care Zinc Oxide Ointment 30g tube Discharge Instruction: Apply Zinc Oxide to periwound with each dressing change Topical Primary Dressing Hydrofera Blue Classic Foam, 2x2 in Discharge Instruction: Moisten with saline prior to applying to wound bed Secondary Dressing Zetuvit Plus 4x8 in Discharge Instruction: Apply over primary dressing as directed. Secured With [employer], 4.5x3.1 (in/yd) Discharge Instruction: Secure with Kerlix as directed. 6M Medipore H Soft Cloth Surgical T ape, 4 x 10 (in/yd) Discharge Instruction: Secure with tape as directed. Compression Wrap Compression Stockings Add-Ons Electronic Signature(s) Signed: 10/08/2021 1:10:25 PM By: Sandre Kitty Entered By: Sandre Kitty on 10/07/2021 09:56:32 -------------------------------------------------------------------------------- Vitals Details Patient Name: Date of Service: [name], [place] 10/07/2021 9:45 A M Medical Record Number: [id number] Patient Account Number: [id number] Date of Birth/Sex: Treating RN: 1975/11/22 (46 y.o. M) Primary Care Shyanna Klingel: [name] Other Clinician: Referring Xee Hollman: Treating Harriet Sutphen/Extender: [name] in [name]: 21 Vital Signs Time Taken: 09:44 Temperature (F): 98.7 Height (in): 69 Pulse (bpm): 73 Weight (lbs): 280 Respiratory Rate  (breaths/min): 18 Body Mass Index (BMI): 41.3 Blood Pressure (mmHg): 154/90 Reference Range: 80 - 120 mg / dl Electronic Signature(s) Signed: 10/08/2021 1:10:25 PM By: Sandre Kitty Entered By: Sandre Kitty on 10/07/2021 09:45:12

## 2021-10-09 IMAGING — CT CT CHEST W/O CM
1 of 3 series · 15 of 32 positions shown, 19 images · non-contrast
Comparison: Chest radiograph dated 07/28/2019 and 07/22/2019.

CLINICAL DATA: 43-year-old male with dyspnea on exertion.

EXAM:
CT CHEST WITHOUT CONTRAST
TECHNIQUE: Multidetector CT imaging of the chest was performed following the
standard protocol without IV contrast.

[Series 3: (person_name) thins · axial · 0.93mm/px · z∈[+862,+1170]mm · 15 of 482 slices shown, 19 images]
[im 21/482  mediastinal]
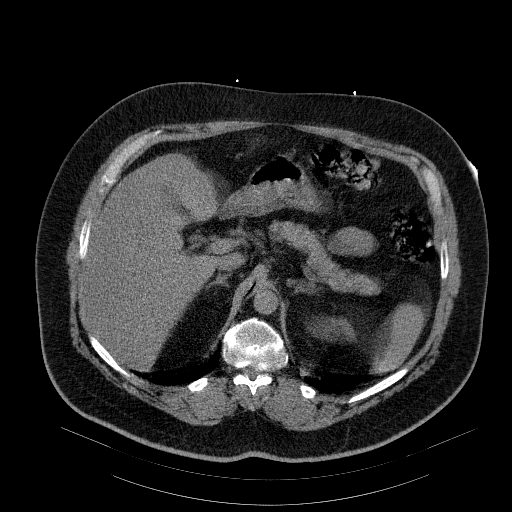
[im 21/482  lung]
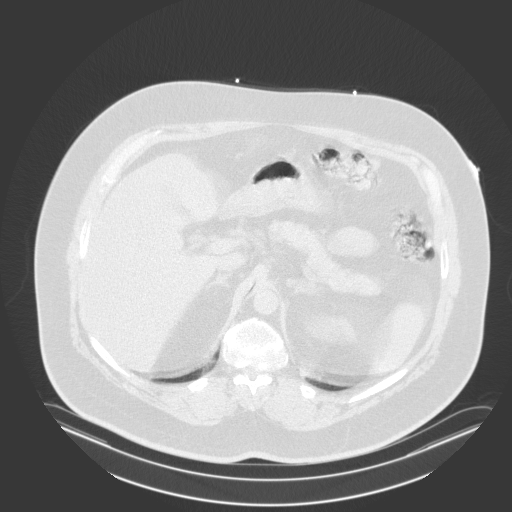
[im 63/482  lung]
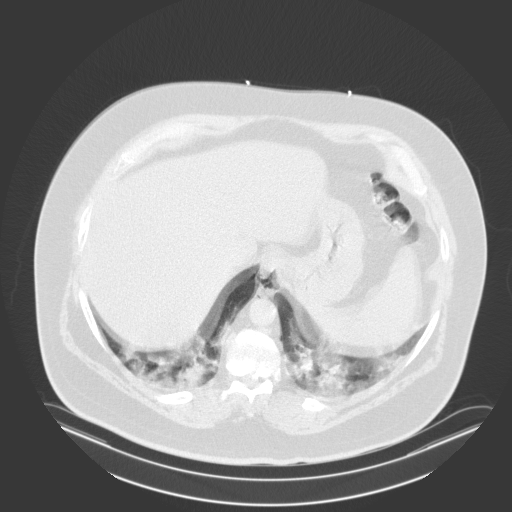
[im 105/482  lung]
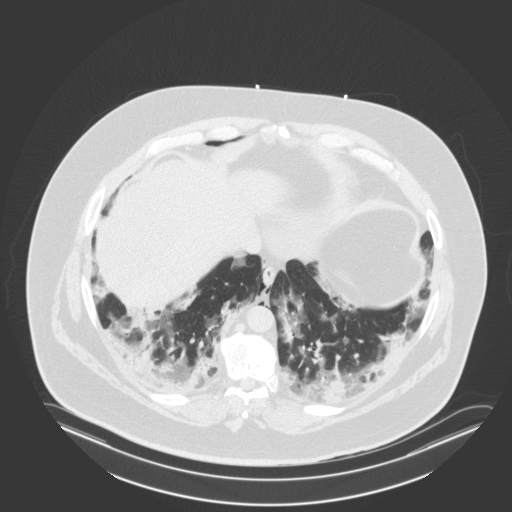
[im 126/482  lung]
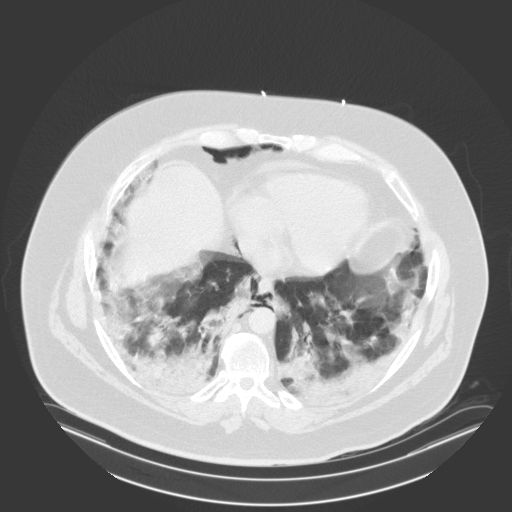
[im 161/482  mediastinal]
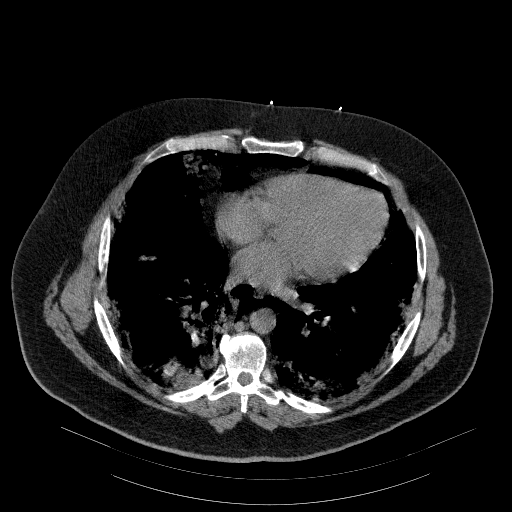
[im 161/482  lung]
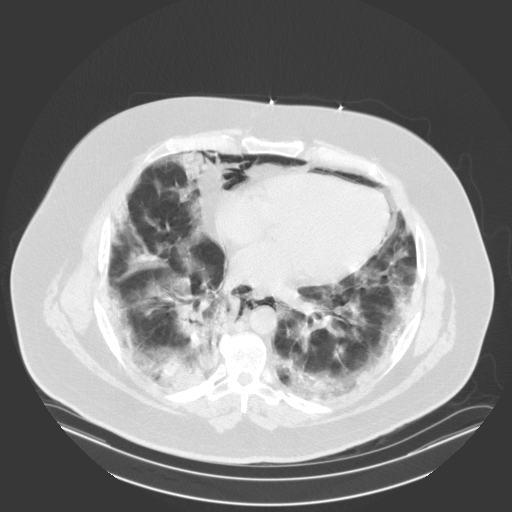
[im 189/482  lung]
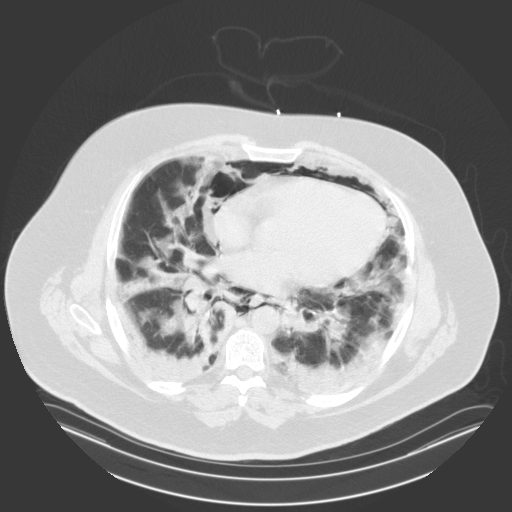
[im 205/482  lung]
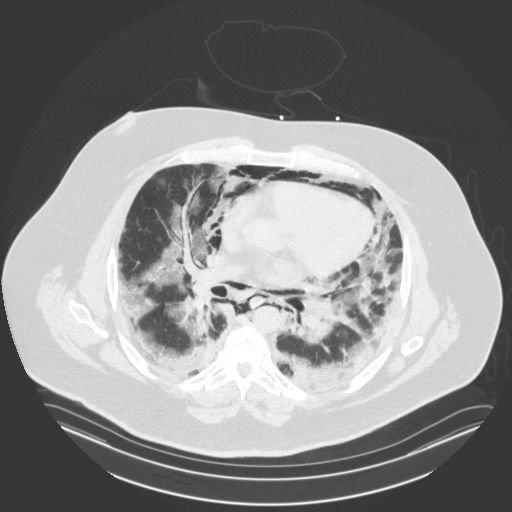
[im 231/482  lung]
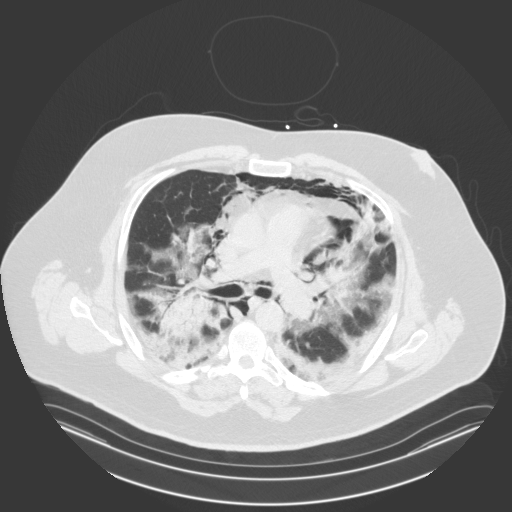
[im 272/482  mediastinal]
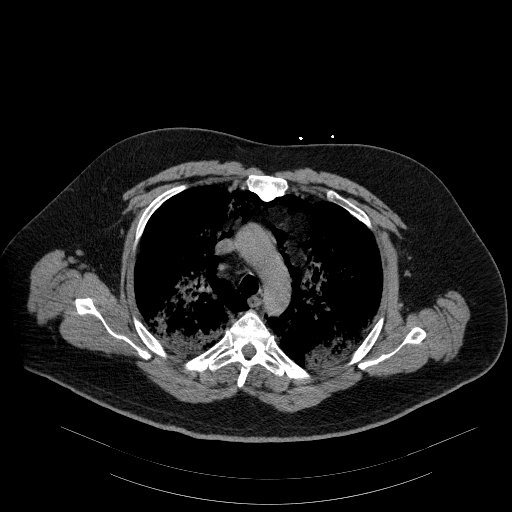
[im 272/482  lung]
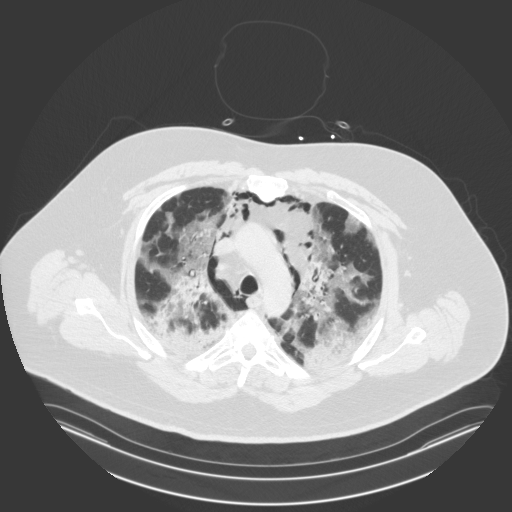
[im 293/482  lung]
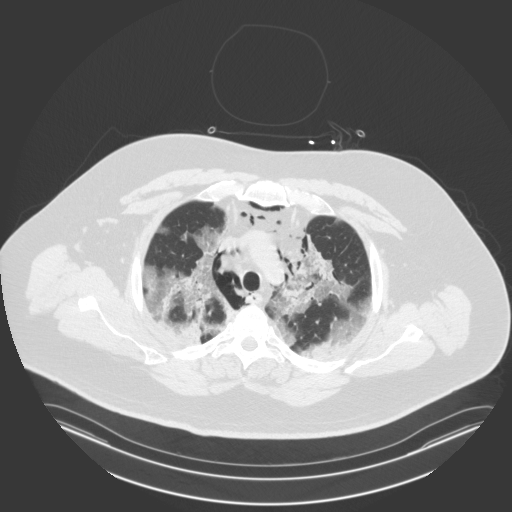
[im 321/482  lung]
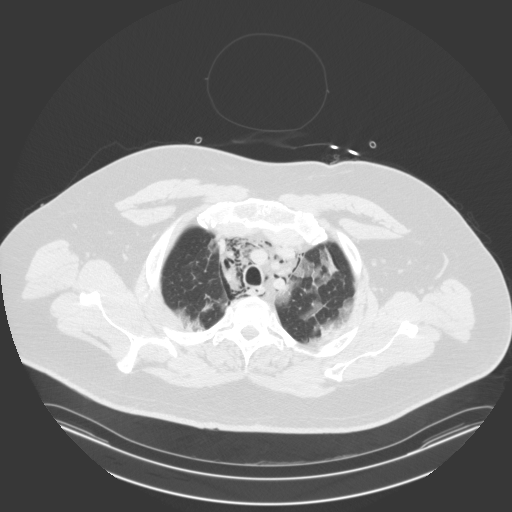
[im 356/482  lung]
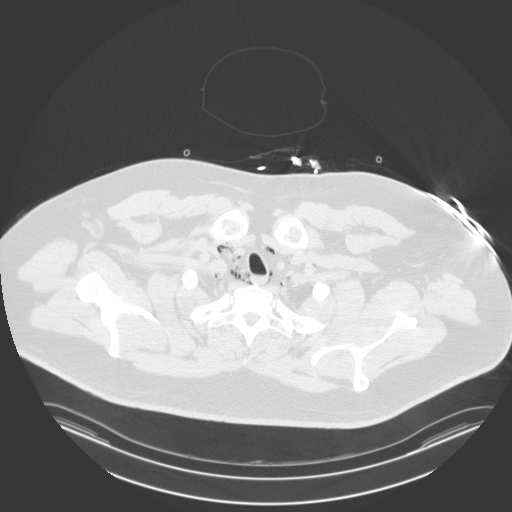
[im 377/482  mediastinal]
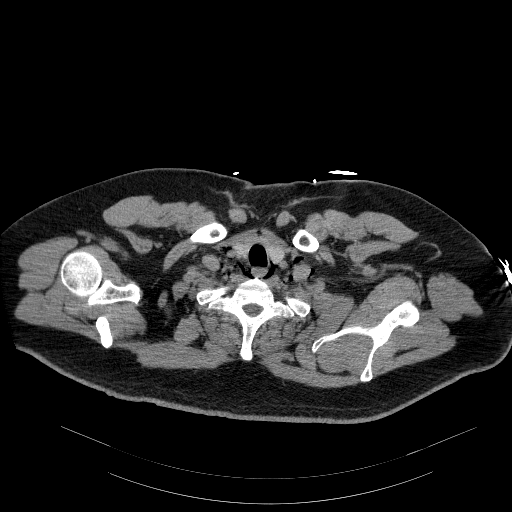
[im 377/482  lung]
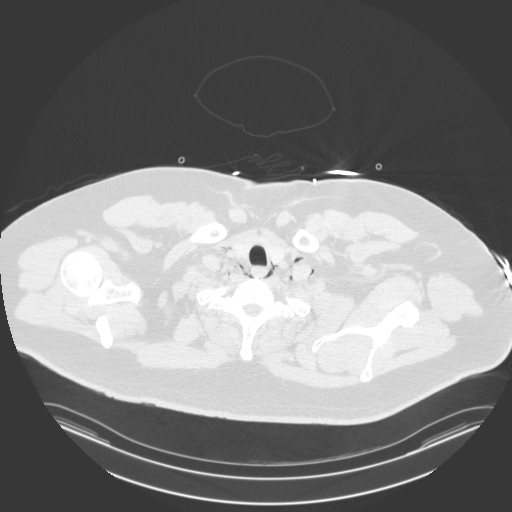
[im 419/482  lung]
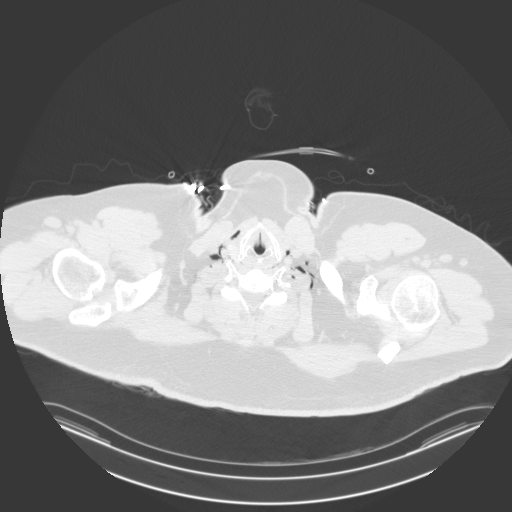
[im 461/482  lung]
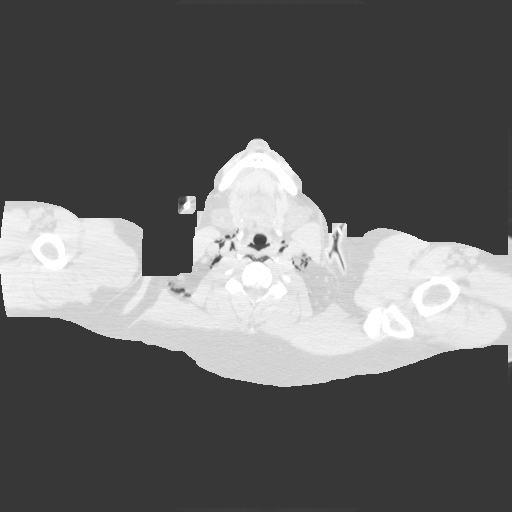

[15 of 32 positions shown; findings below may reference images not displayed]

FINDINGS: Evaluation of this exam is limited in the absence of intravenous
contrast. Evaluation is also limited due to respiratory motion
artifact.

Cardiovascular: There is no cardiomegaly or pericardial effusion.
The thoracic aorta and central pulmonary arteries are grossly
unremarkable on this noncontrast CT.

Mediastinum/Nodes: No hilar or mediastinal adenopathy. The esophagus
and the thyroid gland are grossly unremarkable as visualized. There
is extensive pneumomediastinum extending superiorly and dissecting
through the fat planes of the neck. No mediastinal fluid collection.

Lungs/Pleura: Extensive bilateral patchy ground-glass opacities with
predominant subpleural distribution most consistent with multifocal
pneumonia, possibly atypical or viral in etiology including
[id number]. Clinical correlation is recommended. No pleural effusion
or pneumothorax. The central airways are patent.

Upper Abdomen: Fatty infiltration of the liver. The visualized upper
abdomen is otherwise unremarkable.

Musculoskeletal: No chest wall mass or suspicious bone lesions
identified.
IMPRESSION: 1. Extensive pneumomediastinum.
2. Multifocal pneumonia, possibly atypical or viral in etiology.
Clinical correlation is recommended.

These results were called by telephone at the time of interpretation
on 07/30/2019 at [DATE] to provider NATTY FEY , who
verbally acknowledged these results.

## 2021-10-10 IMAGING — DX DG CHEST 1V PORT
1 series · 1 of 1 positions shown · non-contrast
Comparison: 07/28/2019

CLINICAL DATA: Shortness of breath

EXAM:
PORTABLE CHEST 1 VIEW

[chest]
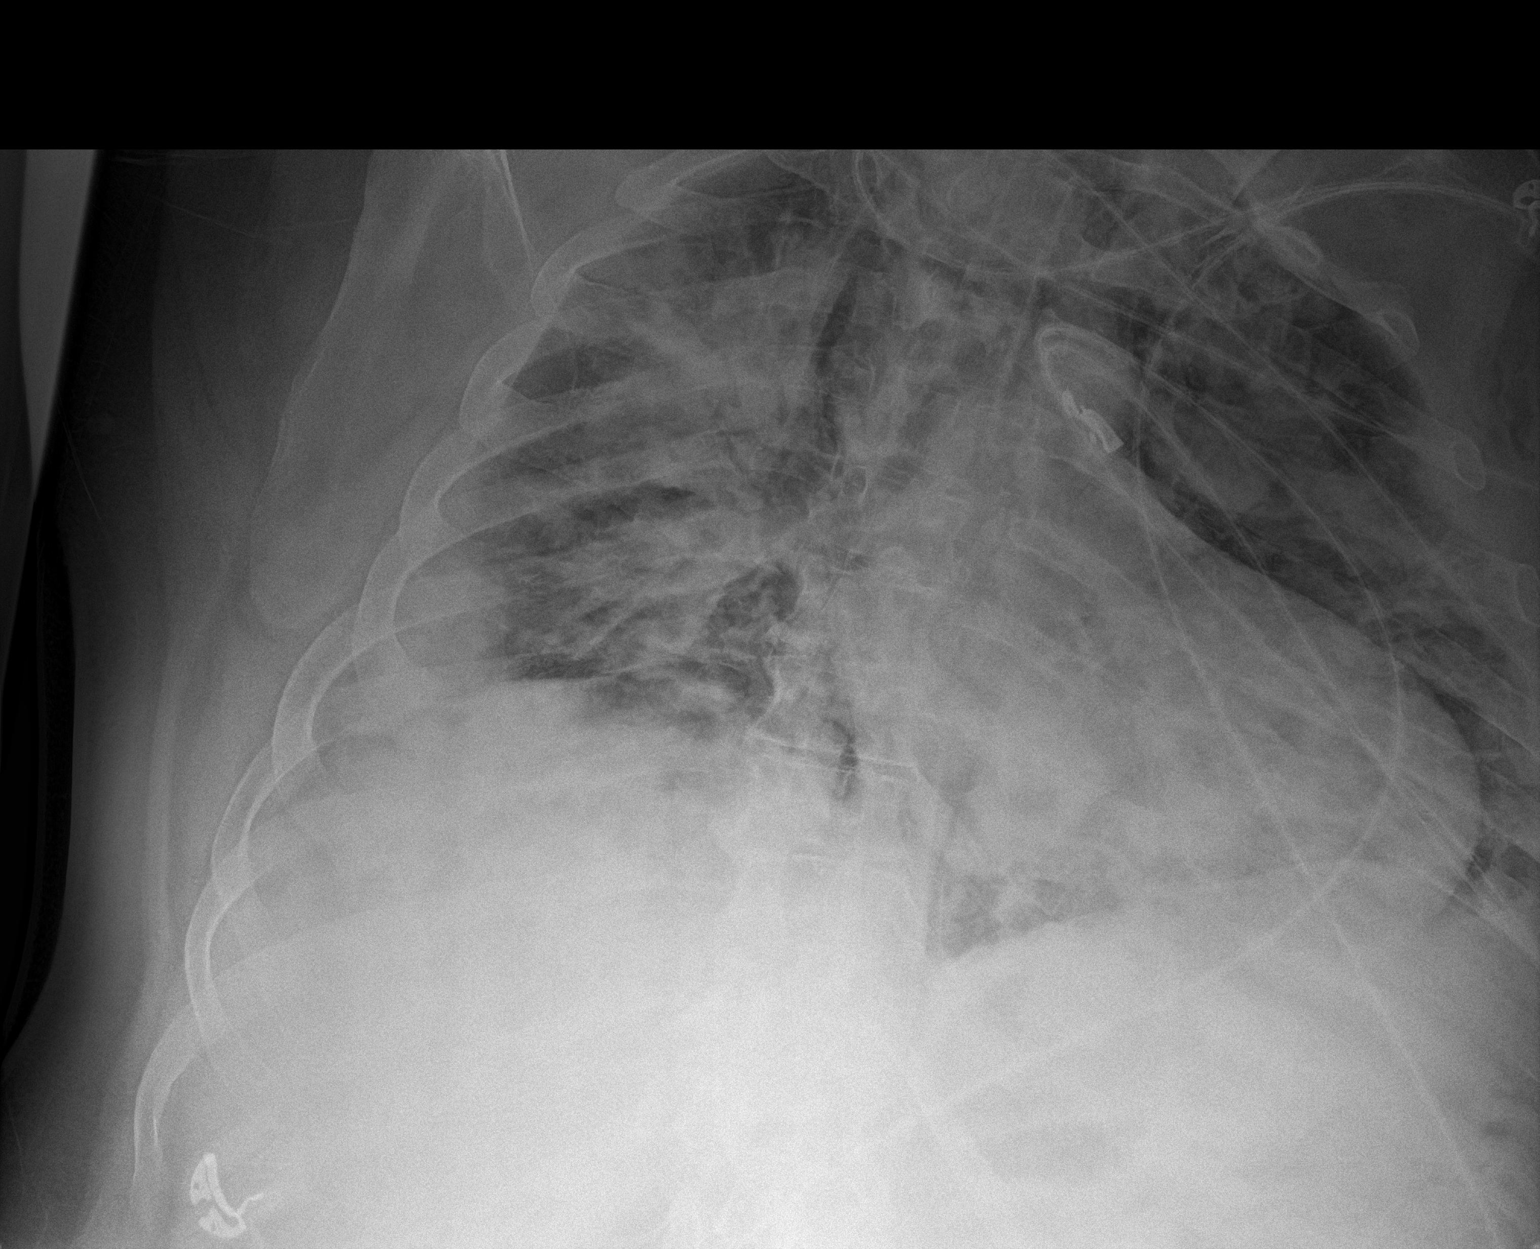

[1 of 1 positions shown; findings below may reference images not displayed]

FINDINGS: Cardiomegaly. Diffuse bilateral airspace disease slightly worsening
since prior study. No significant effusions. No acute bony
abnormality.
IMPRESSION: Diffuse severe bilateral airspace disease, slightly worsening since
prior study.

## 2021-10-11 IMAGING — DX DG CHEST 1V PORT
1 series · 1 of 1 positions shown · non-contrast
Comparison: 07/31/2019 portable chest and earlier.
COMPARISON: 07/31/2019 portable chest and earlier.

Addendum:
CLINICAL DATA: 43-year-old male [id number].  Crepitus.

EXAM:
PORTABLE CHEST 1 VIEW

[chest]
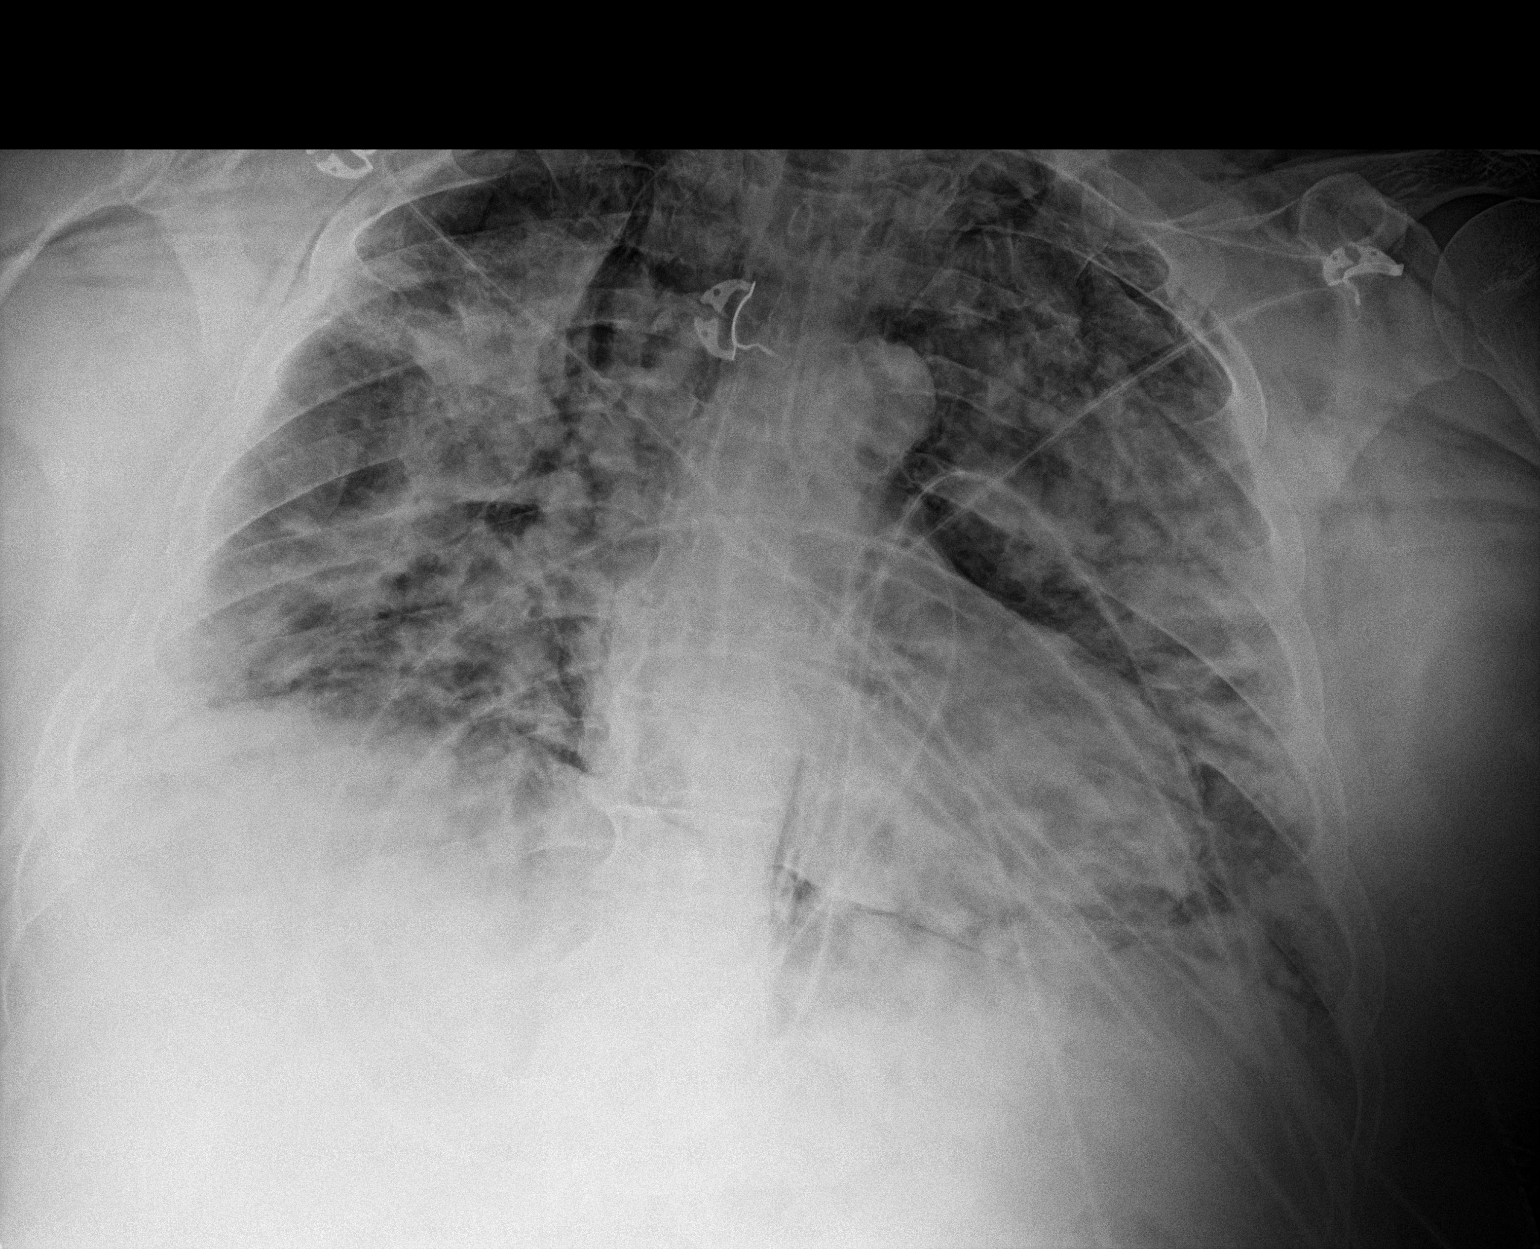

[1 of 1 positions shown; findings below may reference images not displayed]

FINDINGS: Portable AP semi upright view at 8799 hours.

New since the CT on 07/30/2019 widespread chest wall and lower neck
subcutaneous gas, increased from the portable chest yesterday, and
could be extension from the pneumomediastinum seen on CT.

However, there is evidence of new bilateral pneumothoraces since
yesterday. Pleural edges visible over post posterior 3rd ribs.

Stable cardiac size and mediastinal contours. Patchy and confluent
widespread bilateral pulmonary opacity is stable. No pleural
effusion evident. Paucity of bowel gas. Visualized tracheal air
column is within normal limits. Stable visualized osseous
structures.
IMPRESSION: 1. New bilateral pneumothoraces, small or at most moderate given
semi-upright portable technique.
2. Widespread chest wall and lower neck subcutaneous gas has
increased since the portable film yesterday.
3. Otherwise stable bilateral [id number].  Pneumonia

ADDENDUM:
Study discussed by telephone with Dr. Garnett on 08/01/2019 at 4142
hours. He had seen the report of this case in a timely fashion, but
was working in isolation without access to a telephone.

*** End of Addendum ***
FINDINGS: Portable AP semi upright view at 8799 hours.

New since the CT on 07/30/2019 widespread chest wall and lower neck
subcutaneous gas, increased from the portable chest yesterday, and
could be extension from the pneumomediastinum seen on CT.

However, there is evidence of new bilateral pneumothoraces since
yesterday. Pleural edges visible over post posterior 3rd ribs.

Stable cardiac size and mediastinal contours. Patchy and confluent
widespread bilateral pulmonary opacity is stable. No pleural
effusion evident. Paucity of bowel gas. Visualized tracheal air
column is within normal limits. Stable visualized osseous
structures.
IMPRESSION: 1. New bilateral pneumothoraces, small or at most moderate given
semi-upright portable technique.
2. Widespread chest wall and lower neck subcutaneous gas has
increased since the portable film yesterday.
3. Otherwise stable bilateral [id number].  Pneumonia

## 2021-10-12 IMAGING — DX DG CHEST 1V PORT
1 series · 1 of 1 positions shown · non-contrast
Comparison: 08/01/2019

CLINICAL DATA: Bilateral pneumothoraces.

EXAM:
PORTABLE CHEST 1 VIEW

[chest ap]
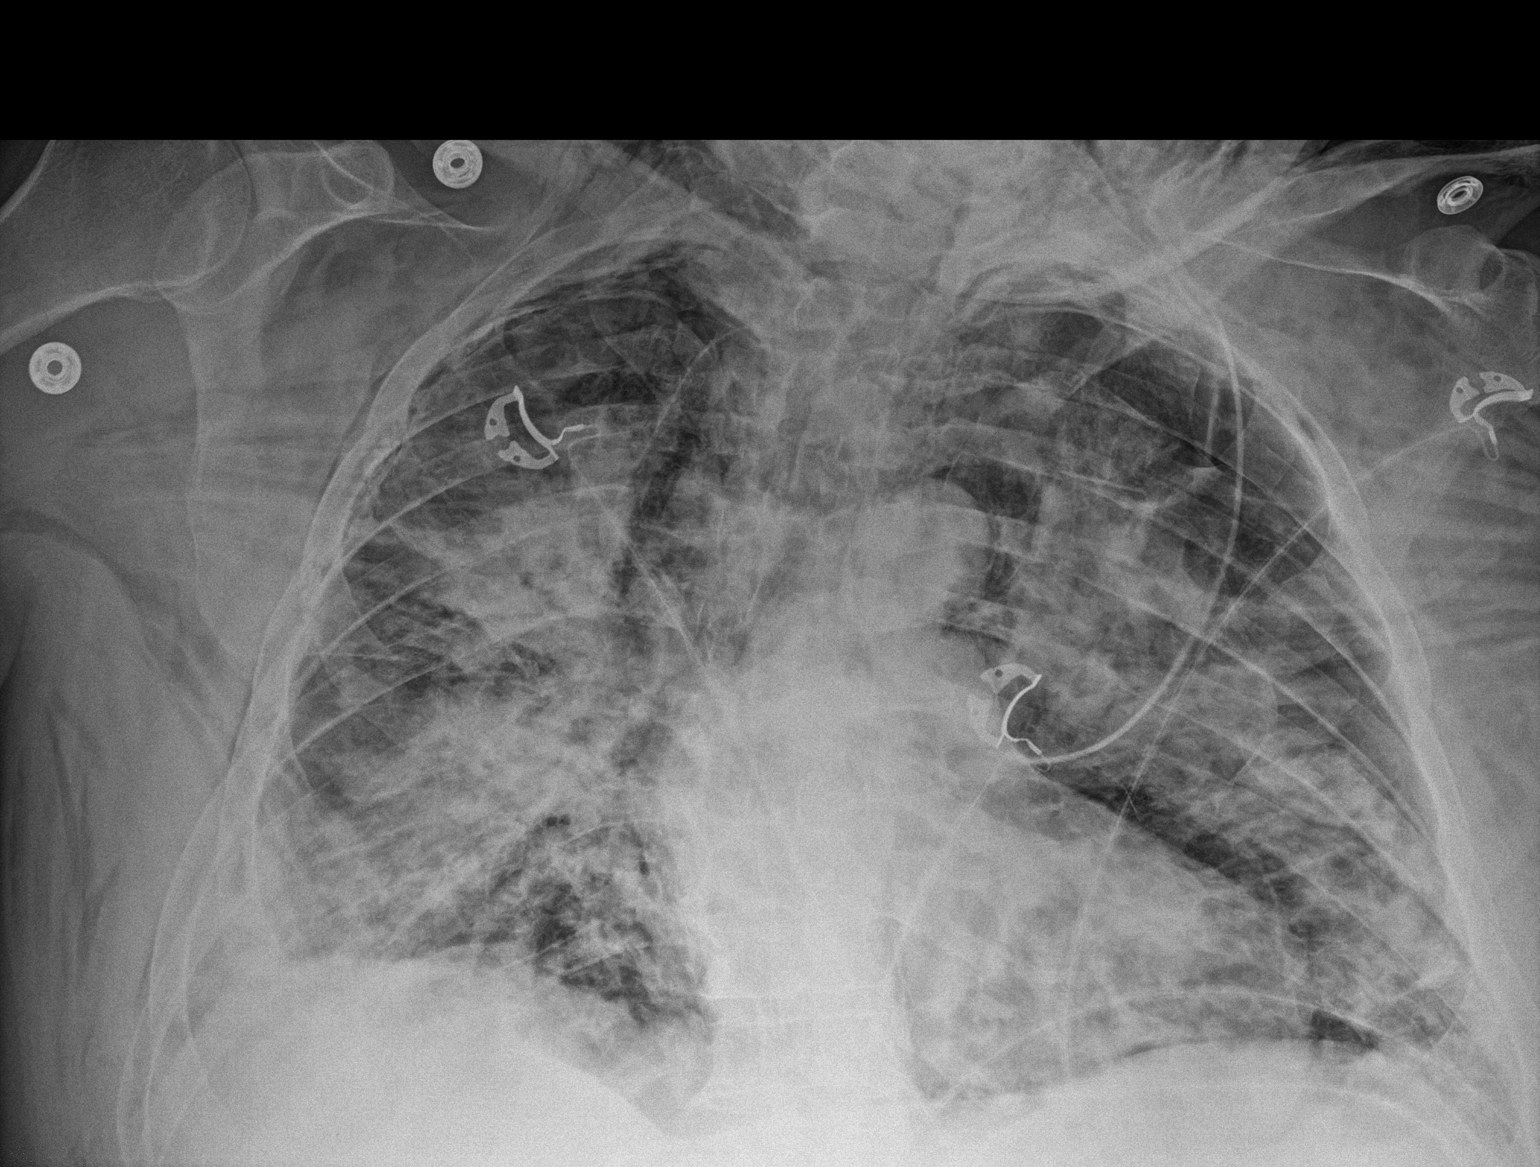

[1 of 1 positions shown; findings below may reference images not displayed]

FINDINGS: Examination demonstrates small bilateral pneumothoraces without
significant change. Moderate bilateral airspace opacification
without significant change. Cardiomediastinal silhouette is within
normal. Moderate subcutaneous emphysema over the neck and chest
unchanged.
IMPRESSION: 1. Persistent moderate bilateral multifocal airspace process likely
multifocal pneumonia.

2. Stable small bilateral pneumothoraces. Subcutaneous emphysema
over the neck and chest unchanged.

## 2021-10-13 DIAGNOSIS — R7303 Prediabetes: Secondary | ICD-10-CM | POA: Diagnosis not present

## 2021-10-13 DIAGNOSIS — I10 Essential (primary) hypertension: Secondary | ICD-10-CM | POA: Diagnosis not present

## 2021-10-13 IMAGING — CR DG CHEST 1V PORT
1 series · 1 of 1 positions shown · non-contrast
Comparison: Earlier radiograph dated 08/03/2019.

CLINICAL DATA: 43-year-old male with acute respiratory failure and
hypoxia.

EXAM:
PORTABLE CHEST 1 VIEW

[AP]
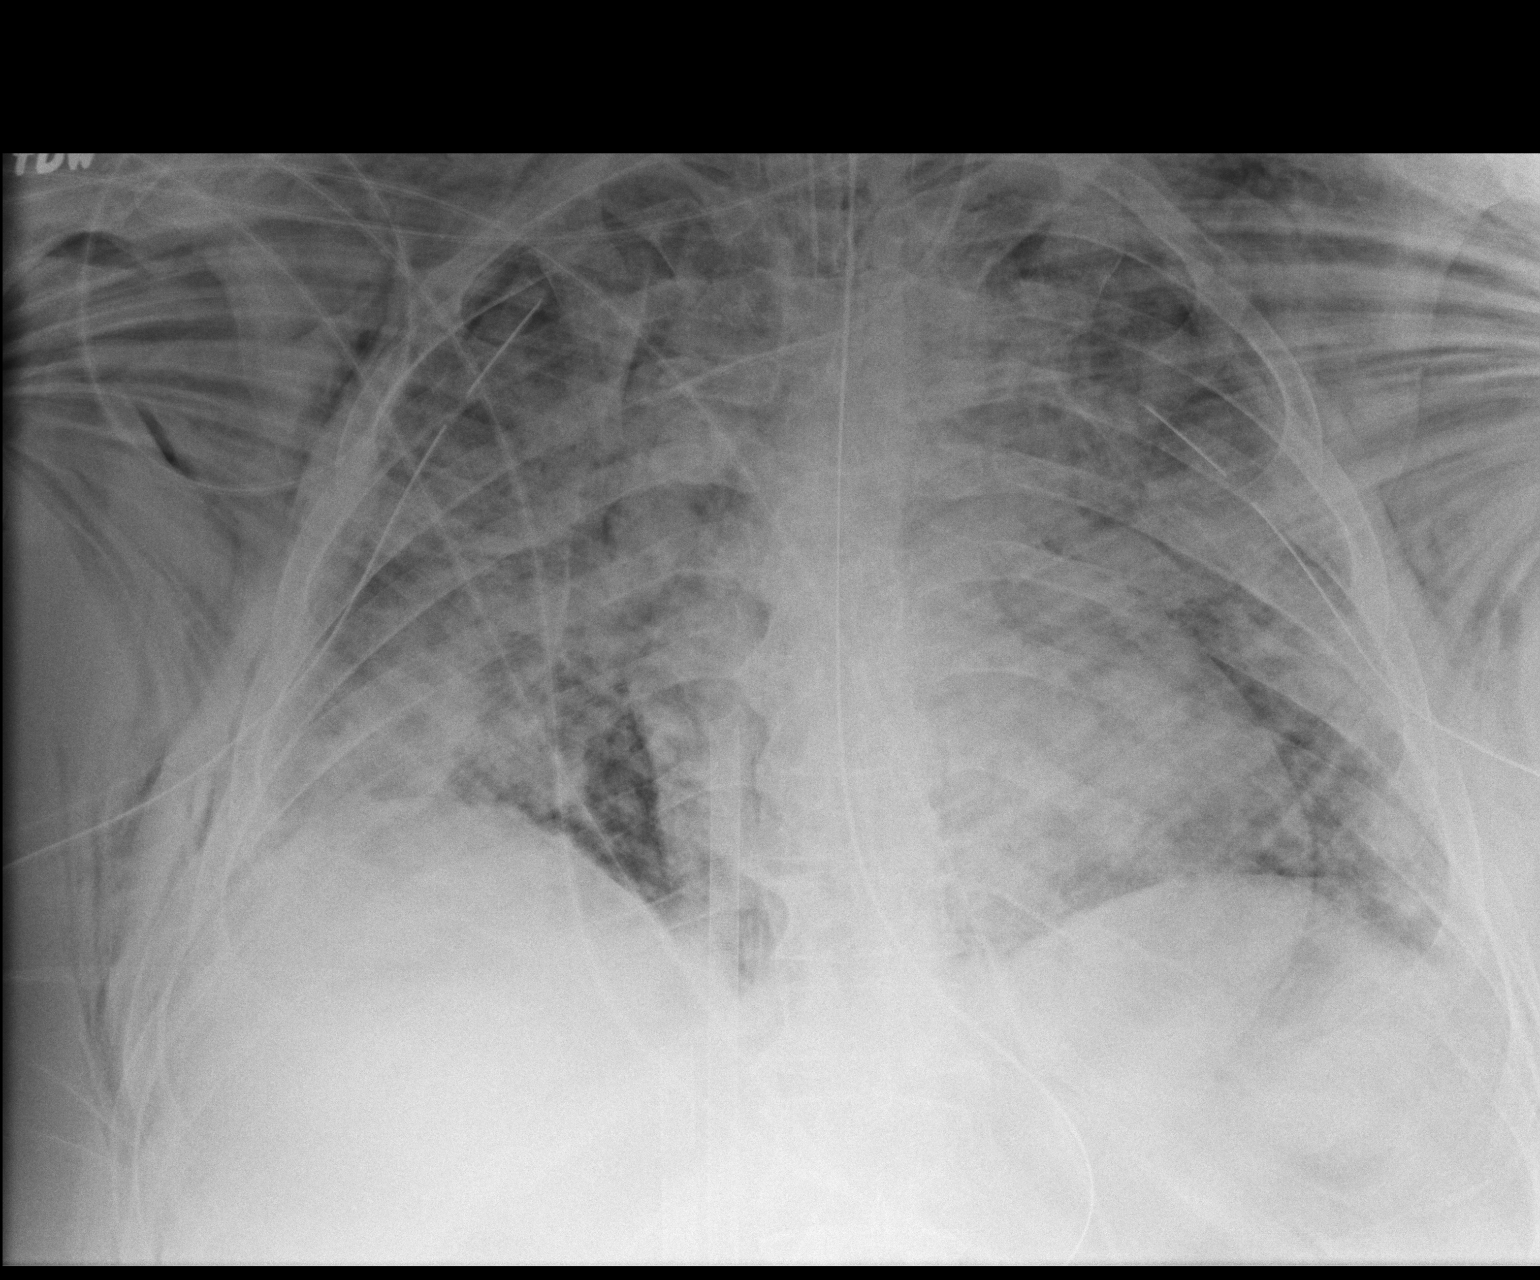

[1 of 1 positions shown; findings below may reference images not displayed]

FINDINGS: Evaluation is very limited due to extensive subcutaneous emphysema.

Endotracheal tube with tip approximately 3 cm above the carina,
enteric tube extending inferior to the diaphragm with tip beyond the
inferior margin of the image, bilateral chest tubes and left IJ
central venous line in similar position.

Partially visualized inferiorly accessed tubes with tip of the more
proximal tube close to the right atrium/SVC junction and the tip of
the more inferior tube at the junction of the right atrium/IVC.
Clinical correlation is recommended.

Bilateral airspace opacities similar to prior radiograph. Stable
cardiomediastinal silhouette. No acute osseous pathology.
IMPRESSION: 1. No significant interval change in the appearance of the lungs
compared to the earlier radiograph.
2. Support lines and tubes in similar position. Interval placement
of two inferiorly accessed central venous lines. Clinical
correlation is recommended.
3. Extensive subcutaneous emphysema similar to prior radiograph.

## 2021-10-13 IMAGING — DX DG CHEST 1V PORT
1 series · 1 of 1 positions shown · non-contrast
Comparison: August 03, 2019

CLINICAL DATA: [id number] positive. ARDS.

EXAM:
PORTABLE CHEST 1 VIEW

[chest ap]
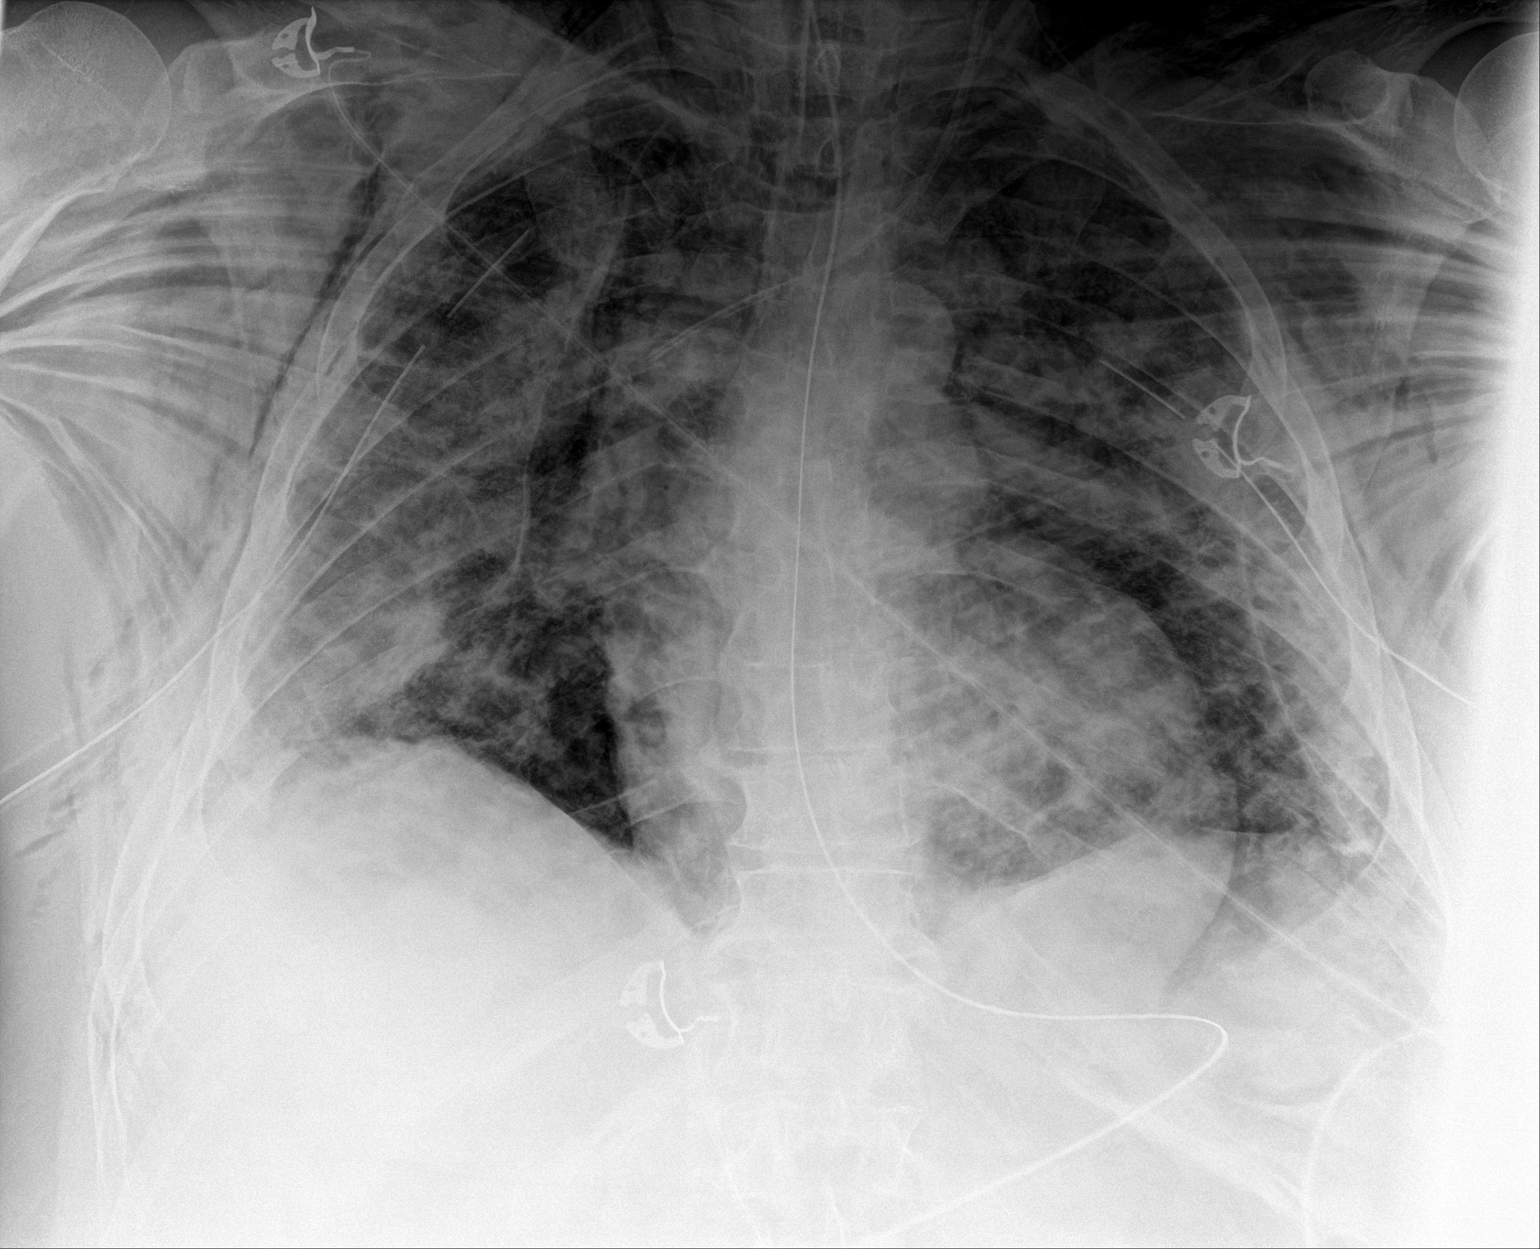

[1 of 1 positions shown; findings below may reference images not displayed]

FINDINGS: Extensive subcutaneous emphysema is identified over bilateral chest.
Bilateral chest tubes, endotracheal tube are identified. Feeding
tube is identified with distal tip not included on film but is at
least in the stomach. Diffuse patchy consolidation of bilateral
lungs are identified unchanged.
IMPRESSION: 1. Extensive subcutaneous emphysema is identified over bilateral
chest.
2. Bilateral chest tubes are identified.
3. Diffuse patchy consolidation of bilateral lungs are identified
unchanged.

## 2021-10-14 ENCOUNTER — Encounter (HOSPITAL_BASED_OUTPATIENT_CLINIC_OR_DEPARTMENT_OTHER): Payer: BC Managed Care – PPO | Admitting: General Surgery

## 2021-10-14 ENCOUNTER — Other Ambulatory Visit: Payer: Self-pay

## 2021-10-14 DIAGNOSIS — G7281 Critical illness myopathy: Secondary | ICD-10-CM | POA: Diagnosis not present

## 2021-10-14 DIAGNOSIS — L97522 Non-pressure chronic ulcer of other part of left foot with fat layer exposed: Secondary | ICD-10-CM | POA: Diagnosis not present

## 2021-10-14 DIAGNOSIS — I10 Essential (primary) hypertension: Secondary | ICD-10-CM | POA: Diagnosis not present

## 2021-10-14 DIAGNOSIS — L97512 Non-pressure chronic ulcer of other part of right foot with fat layer exposed: Secondary | ICD-10-CM | POA: Diagnosis not present

## 2021-10-14 DIAGNOSIS — M6259 Muscle wasting and atrophy, not elsewhere classified, multiple sites: Secondary | ICD-10-CM | POA: Diagnosis not present

## 2021-10-14 DIAGNOSIS — Z741 Need for assistance with personal care: Secondary | ICD-10-CM | POA: Diagnosis not present

## 2021-10-14 DIAGNOSIS — M6281 Muscle weakness (generalized): Secondary | ICD-10-CM | POA: Diagnosis not present

## 2021-10-14 DIAGNOSIS — Z8616 Personal history of COVID-19: Secondary | ICD-10-CM | POA: Diagnosis not present

## 2021-10-15 IMAGING — DX DG CHEST 1V PORT
2 series · 2 of 2 positions shown · non-contrast
Comparison: August 04, 2019.

CLINICAL DATA: Endotracheal tube.

EXAM:
PORTABLE CHEST 1 VIEW

[chest ap (1 of 2)]
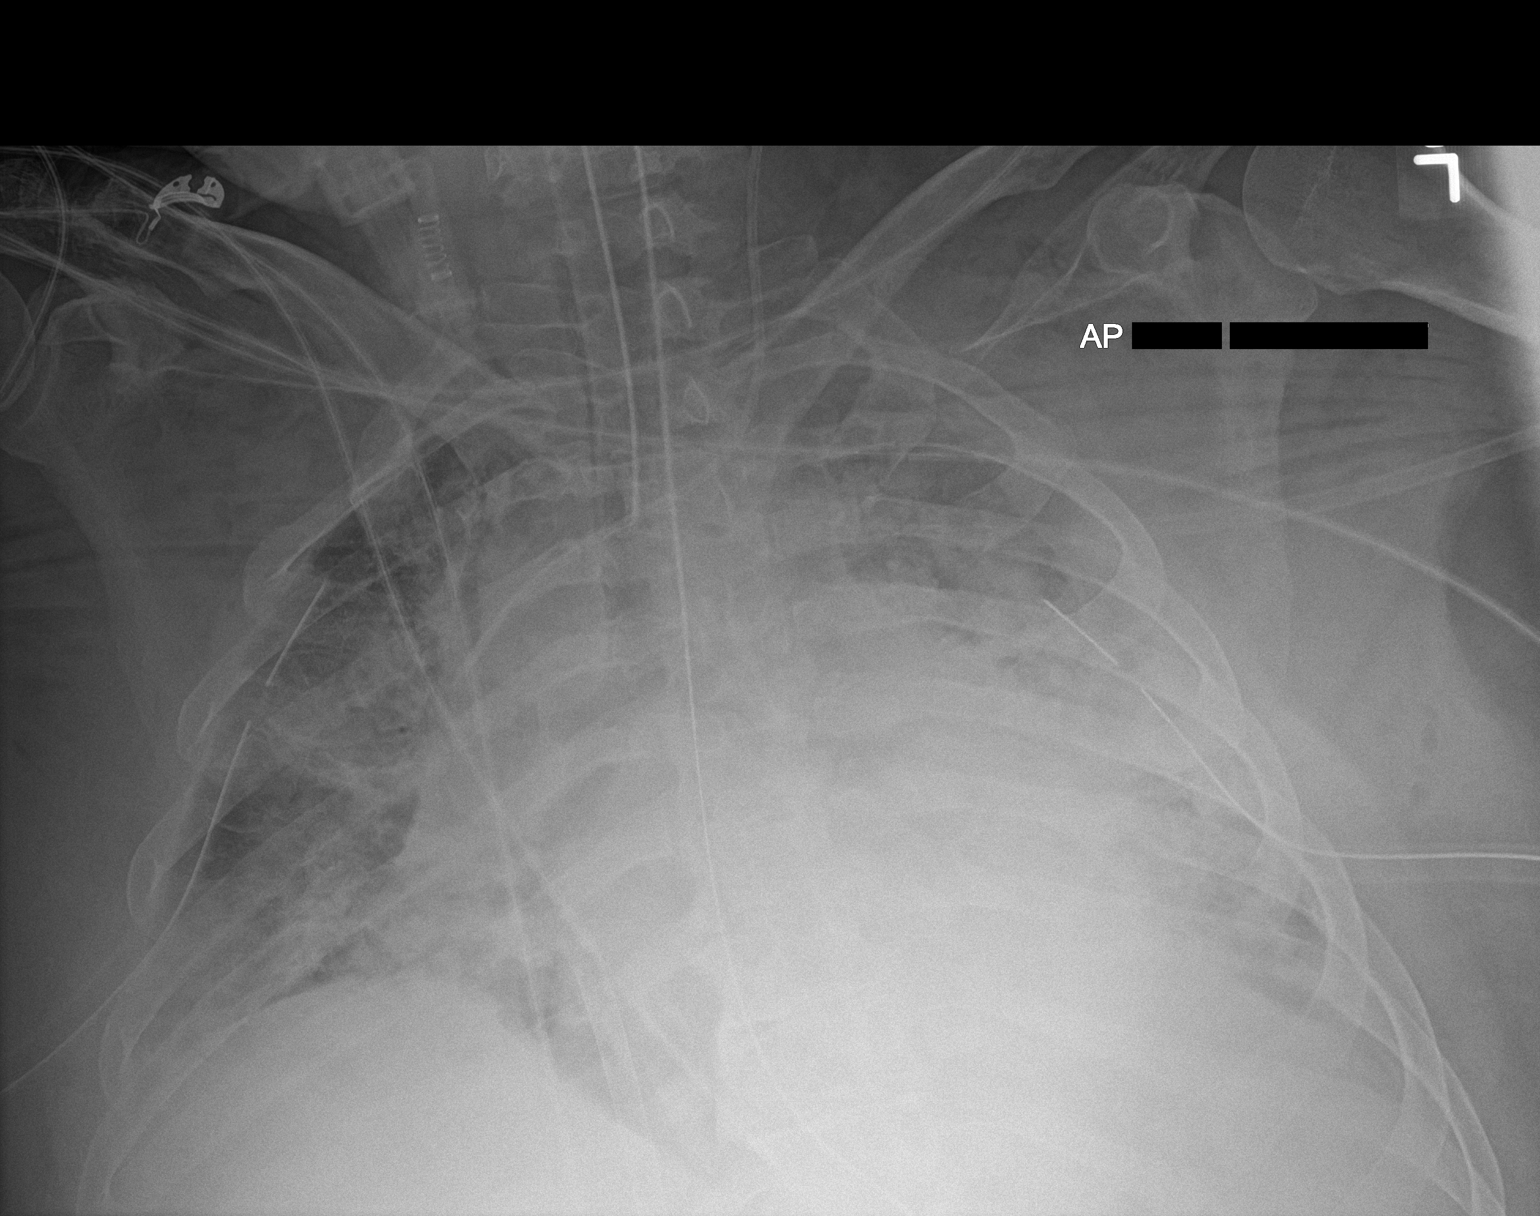

[chest ap (2 of 2)]
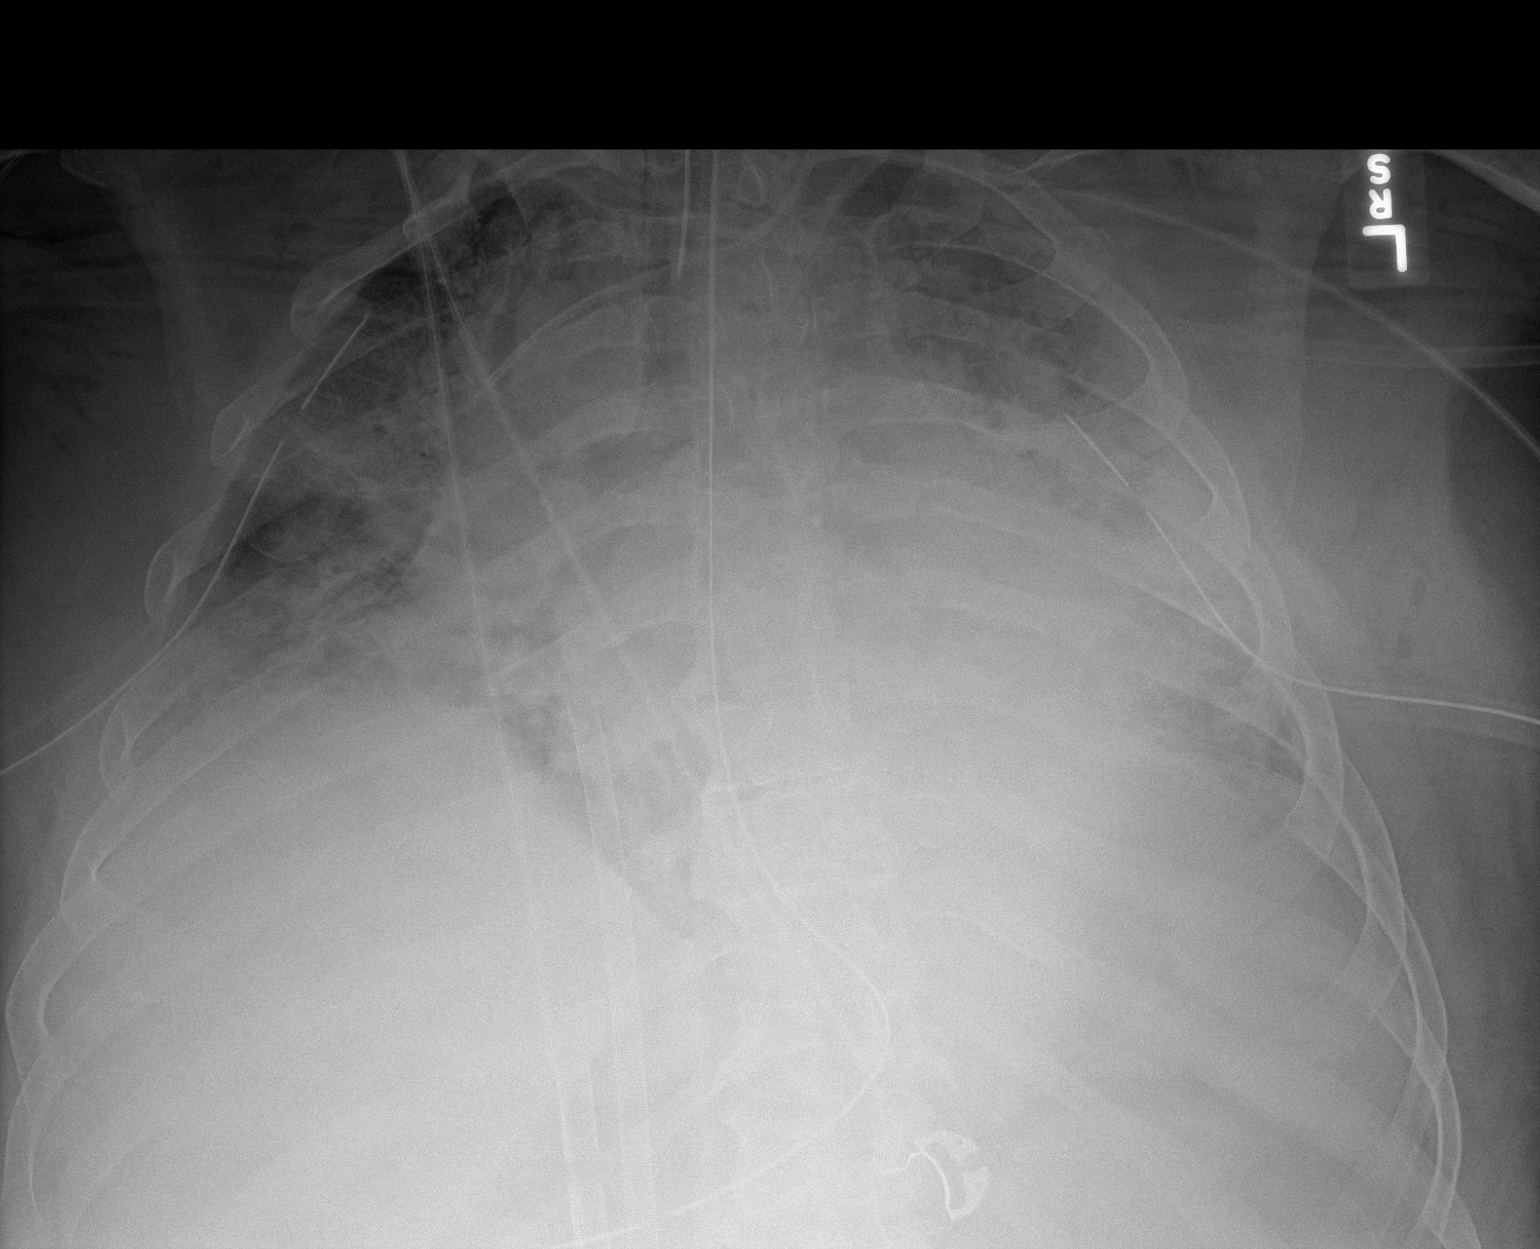

[2 of 2 positions shown; findings below may reference images not displayed]

FINDINGS: Stable cardiomediastinal silhouette. Endotracheal and nasogastric
tubes are unchanged in position. Bilateral chest tubes are noted
without pneumothorax. Stable bilateral lung opacities are noted
concerning for pneumonia. Bony thorax is unremarkable.
IMPRESSION: Stable support apparatus. Stable bilateral lung opacities are noted
concerning for pneumonia. No pneumothorax is noted. Bilateral chest
tubes are unchanged without pneumothorax.

## 2021-10-15 IMAGING — DX DG CHEST 1V PORT
1 series · 1 of 1 positions shown · non-contrast
Comparison: 08/05/2019 at 8119 hours

CLINICAL DATA: Endotracheal tube exchange

EXAM:
PORTABLE CHEST 1 VIEW

[chest]
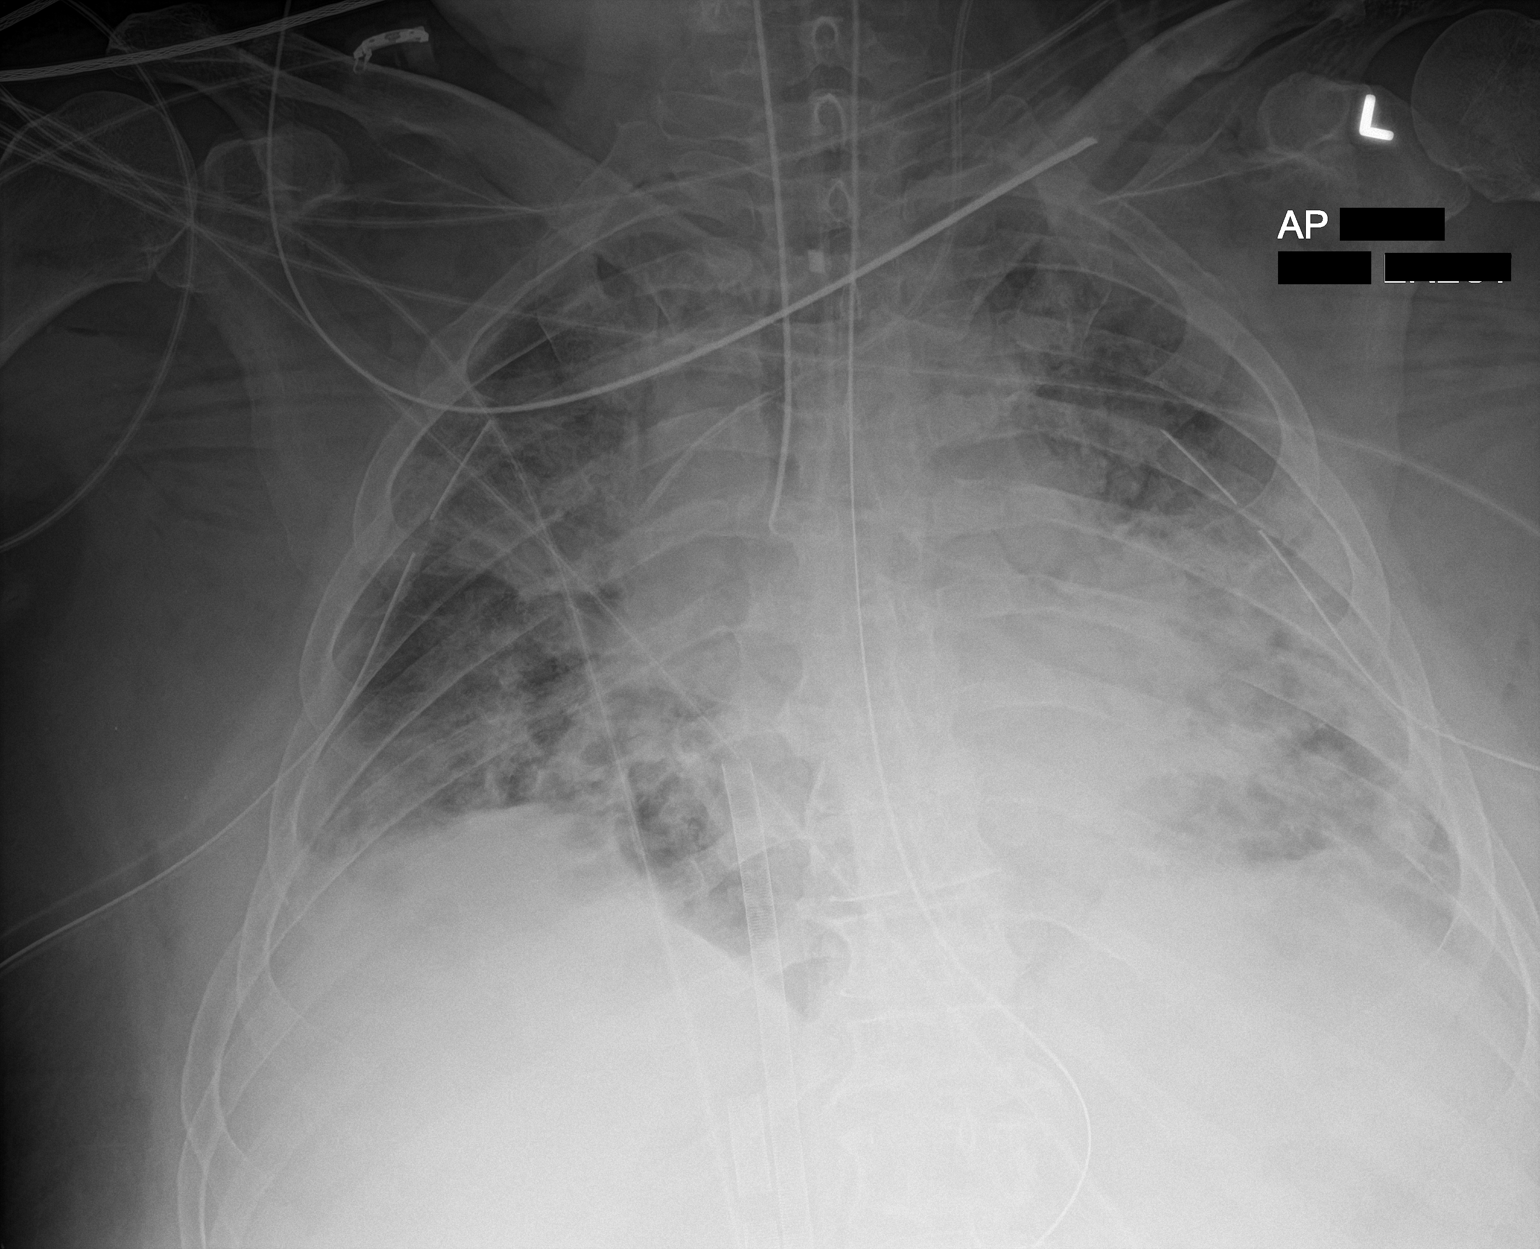

[1 of 1 positions shown; findings below may reference images not displayed]

FINDINGS: Endotracheal tube now projects within the right mainstem bronchus.
Enteric tube courses below the diaphragm with distal tip and side
hole beyond the inferior margin of the film. Left IJ central venous
catheter is unchanged in positioning. Bilateral chest tubes remain
in place. Stable cardiomediastinal contours. Diffuse patchy
bilateral airspace opacities. No pneumothorax is evident.
IMPRESSION: 1. Endotracheal tube tip now projects within the right mainstem
bronchus. Recommend retraction 4-5 cm.
2. Stable appearance of the chest with diffuse patchy bilateral
airspace opacities.
3. No pneumothorax.

These results will be called to the ordering clinician or
representative by the Radiologist Assistant, and communication
documented in the PACS or zVision Dashboard.

## 2021-10-15 NOTE — Progress Notes (Signed)
[name], [name] ([id number]) Visit Report for 10/14/2021 Arrival Information Details Patient Name: Date of Service: [name], [name] 10/14/2021 10:30 A M Medical Record Number: [id number] Patient Account Number: [id number] Date of Birth/Sex: Treating RN: 22-Oct-1975 (46 y.o. [name], [name] Primary Care Renley Gutman: [name] Other Clinician: Referring Kaloni Bisaillon: Treating Erich Kochan/Extender: [name] in Treatment: 59 Visit Information History Since Last Visit Added or deleted any medications: No Patient Arrived: Wheel Chair Any new allergies or adverse reactions: No Arrival Time: 10:54 Had a fall or experienced change in No Accompanied By: self activities of daily living that may affect Transfer Assistance: Manual risk of falls: Patient Identification Verified: Yes Signs or symptoms of abuse/neglect since last visito No Secondary Verification Process Completed: Yes Hospitalized since last visit: No Patient Requires Transmission-Based Precautions: No Implantable device outside of the clinic excluding No Patient Has Alerts: No cellular tissue based products placed in the center since last visit: Has Dressing in Place as Prescribed: Yes Pain Present Now: Yes Electronic Signature(s) Signed: 10/14/2021 5:18:04 PM By: [name] RN Entered By: [name] on 10/14/2021 10:59:05 -------------------------------------------------------------------------------- Encounter Discharge Information Details Patient Name: Date of Service: [name], [place]. 10/14/2021 10:30 A M Medical Record Number: [id number] Patient Account Number: [id number] Date of Birth/Sex: Treating RN: 1976-07-16 (46 y.o. [name] Primary Care [name]: [name] Other Clinician: Referring [name]: Treating [name]/Extender: [name] in Treatment: 39 Encounter Discharge Information Items Post Procedure Vitals Discharge Condition:  Stable Temperature (F): 98.4 Ambulatory Status: Ambulatory Pulse (bpm): 71 Discharge Destination: Home Respiratory Rate (breaths/min): 17 Transportation: Private Auto Blood Pressure (mmHg): 128/73 Accompanied By: alone Schedule Follow-up Appointment: Yes Clinical Summary of Care: Patient Declined Electronic Signature(s) Signed: 10/15/2021 1:38:56 PM By: Levan Hurst RN, BSN Entered By: Levan Hurst on 10/14/2021 12:24:28 -------------------------------------------------------------------------------- Lower Extremity Assessment Details Patient Name: Date of Service: [name], [name] 10/14/2021 10:30 A M Medical Record Number: [id number] Patient Account Number: [id number] Date of Birth/Sex: Treating RN: November 05, 1975 (46 y.o. [name], [name] Primary Care Elasia Furnish: [name] Other Clinician: Referring Shatasia Cutshaw: Treating Rosalyn Archambault/Extender: [name] in Treatment: 65 Edema Assessment Assessed: [name]: Yes] [name]: Yes] Edema: [Left: No] [Right: No] Calf Left: Right: Point of Measurement: 29 cm From Medial Instep 44 cm 43.1 cm Ankle Left: Right: Point of Measurement: 9 cm From Medial Instep 25.5 cm 25.2 cm Vascular Assessment Pulses: Dorsalis Pedis Palpable: [Left:Yes] [Right:Yes] Posterior Tibial Palpable: [Left:Yes] [Right:Yes] Electronic Signature(s) Signed: 10/14/2021 5:18:04 PM By: [name] RN Entered By: [name] on 10/14/2021 11:08:05 -------------------------------------------------------------------------------- Multi Wound Chart Details Patient Name: Date of Service: [name], [name] 10/14/2021 10:30 A M Medical Record Number: [id number] Patient Account Number: [id number] Date of Birth/Sex: Treating RN: 17-Mar-1976 (46 y.o. M) Primary Care [name]: [name] Other Clinician: Referring [name]: Treating [name]/Extender: [name] in Treatment: 50 Vital Signs Height(in):  69 Pulse(bpm): 56 Weight(lbs): 280 Blood Pressure(mmHg): 128/73 Body Mass Index(BMI): 41.3 Temperature(F): 98.4 Respiratory Rate(breaths/min): 17 Photos: [1:Right Calcaneus] [2:Left Calcaneus] [N/A:N/A N/A] Wound Location: [1:Pressure Injury] [2:Pressure Injury] [N/A:N/A] Wounding Event: [1:Pressure Ulcer] [2:Pressure Ulcer] [N/A:N/A] Primary Etiology: [1:Asthma, Angina, Hypertension] [2:Asthma, Angina, Hypertension] [N/A:N/A] Comorbid History: [1:10/07/2019] [2:10/07/2019] [N/A:N/A] Date Acquired: [1:65] [2:65] [N/A:N/A] Weeks of Treatment: [1:Open] [2:Open] [N/A:N/A] Wound Status: [1:No] [2:No] [N/A:N/A] Wound Recurrence: [1:4x1.7x0.5] [2:3x1x0.2] [N/A:N/A] Measurements L x W x D (cm) [1:5.341] [2:2.356] [N/A:N/A] A (cm) : rea [1:2.67] [2:0.471] [N/A:N/A] Volume (cm) : [1:83.10%] [2:91.30%] [N/A:N/A] % Reduction  in A rea: [1:91.60%] [2:98.30%] [N/A:N/A] % Reduction in Volume: [1:Category/Stage III] [2:Category/Stage III] [N/A:N/A] Classification: [1:Medium] [2:Medium] [N/A:N/A] Exudate A mount: [1:Serosanguineous] [2:Serosanguineous] [N/A:N/A] Exudate Type: [1:red, brown] [2:red, brown] [N/A:N/A] Exudate Color: [1:Distinct, outline attached] [2:Distinct, outline attached] [N/A:N/A] Wound Margin: [1:Medium (34-66%)] [2:Large (67-100%)] [N/A:N/A] Granulation A mount: [1:Pink] [2:Red] [N/A:N/A] Granulation Quality: [1:Medium (34-66%)] [2:Small (1-33%)] [N/A:N/A] Necrotic A mount: [1:Fat Layer (Subcutaneous Tissue): Yes Fat Layer (Subcutaneous Tissue): Yes N/A] Exposed Structures: [1:Fascia: No Tendon: No Muscle: No Joint: No Bone: No Small (1-33%)] [2:Fascia: No Tendon: No Muscle: No Joint: No Bone: No None] [N/A:N/A] Epithelialization: [1:Debridement - Selective/Open Wound Debridement - Selective/Open Wound N/A] Debridement: Pre-procedure Verification/Time Out 11:35 [2:11:35] [N/A:N/A] Taken: [1:Callus, Slough] [2:Callus, Slough] [N/A:N/A] Tissue Debrided: [1:Non-Viable Tissue]  [2:Non-Viable Tissue] [N/A:N/A] Level: [1:6.8] [2:3] [N/A:N/A] Debridement A (sq cm): [1:rea Curette] [2:Curette] [N/A:N/A] Instrument: [1:Minimum] [2:Minimum] [N/A:N/A] Bleeding: [1:Pressure] [2:Pressure] [N/A:N/A] Hemostasis A chieved: [1:0] [2:0] [N/A:N/A] Procedural Pain: [1:0] [2:0] [N/A:N/A] Post Procedural Pain: [1:Procedure was tolerated well] [2:Procedure was tolerated well] [N/A:N/A] Debridement Treatment Response: [1:4x1.7x0.5] [2:3x1x0.2] [N/A:N/A] Post Debridement Measurements L x W x D (cm) [1:2.67] [2:0.471] [N/A:N/A] Post Debridement Volume: (cm) [1:Category/Stage III] [2:Category/Stage III] [N/A:N/A] Post Debridement Stage: [1:Debridement] [2:Debridement] [N/A:N/A] Procedures Performed: [2:T Contact Cast otal] Treatment Notes Wound #1 (Calcaneus) Wound Laterality: Right Cleanser Normal Saline Discharge Instruction: Cleanse the wound with Normal Saline prior to applying a clean dressing using gauze sponges, not tissue or cotton balls. Wound Cleanser Discharge Instruction: Cleanse the wound with wound cleanser prior to applying a clean dressing using gauze sponges, not tissue or cotton balls. Peri-Wound Care Topical Primary Dressing Hydrofera Blue Classic Foam, 2x2 in Discharge Instruction: Moisten with saline prior to applying to wound bed Secondary Dressing Zetuvit Plus 4x8 in Discharge Instruction: Apply over primary dressing as directed. Secured With Elastic Bandage 4 inch (ACE bandage) Discharge Instruction: Secure with ACE bandage as directed. Kerlix Roll Sterile, 4.5x3.1 (in/yd) Discharge Instruction: Secure with Kerlix as directed. Compression Wrap Compression Stockings Add-Ons Wound #2 (Calcaneus) Wound Laterality: Left Cleanser Normal Saline Discharge Instruction: Cleanse the wound with Normal Saline prior to applying a clean dressing using gauze sponges, not tissue or cotton balls. Wound Cleanser Discharge Instruction: Cleanse the wound with wound  cleanser prior to applying a clean dressing using gauze sponges, not tissue or cotton balls. Peri-Wound Care Zinc Oxide Ointment 30g tube Discharge Instruction: Apply Zinc Oxide to periwound with each dressing change Topical Primary Dressing Hydrofera Blue Classic Foam, 2x2 in Discharge Instruction: Moisten with saline prior to applying to wound bed Secondary Dressing Zetuvit Plus 4x8 in Discharge Instruction: Apply over primary dressing as directed. Secured With [employer], 4.5x3.1 (in/yd) Discharge Instruction: Secure with Kerlix as directed. 16M Medipore H Soft Cloth Surgical T ape, 4 x 10 (in/yd) Discharge Instruction: Secure with tape as directed. Compression Wrap Compression Stockings Add-Ons Electronic Signature(s) Signed: 10/14/2021 12:45:12 PM By: Fredirick Maudlin MD FACS Entered By: Fredirick Maudlin on 10/14/2021 12:45:11 -------------------------------------------------------------------------------- Multi-Disciplinary Care Plan Details Patient Name: Date of Service: [name], [place] 10/14/2021 10:30 A M Medical Record Number: [id number] Patient Account Number: [id number] Date of Birth/Sex: Treating RN: 1976/01/01 (46 y.o. [name] Primary Care Gauri Galvao: [name] Other Clinician: Referring Shawntia Mangal: Treating Hagop Mccollam/Extender: [name] in Treatment: 33 Multidisciplinary Care Plan reviewed with physician Active Inactive Wound/Skin Impairment Nursing Diagnoses: Impaired tissue integrity Knowledge deficit related to ulceration/compromised skin integrity Goals: Patient/caregiver will verbalize understanding of skin care regimen Date Initiated: 07/15/2020 Target Resolution Date: 11/12/2021 Goal  Status: Active Ulcer/skin breakdown will have a volume reduction of 30% by week 4 Date Initiated: 07/15/2020 Date Inactivated: 08/20/2020 Target Resolution Date: 09/03/2020 Goal Status: Unmet Unmet Reason: no major  changes. Ulcer/skin breakdown will heal within 14 weeks Date Initiated: 12/04/2020 Date Inactivated: 12/10/2020 Target Resolution Date: 12/10/2020 Unmet Reason: wounds still open at 14 Goal Status: Unmet weeks and today 21 weeks. Interventions: Assess patient/caregiver ability to obtain necessary supplies Assess patient/caregiver ability to perform ulcer/skin care regimen upon admission and as needed Assess ulceration(s) every visit Provide education on ulcer and skin care Treatment Activities: Skin care regimen initiated : 07/15/2020 Topical wound management initiated : 07/15/2020 Notes: Electronic Signature(s) Signed: 10/15/2021 1:38:56 PM By: Levan Hurst RN, BSN Entered By: Levan Hurst on 10/14/2021 11:39:15 -------------------------------------------------------------------------------- Pain Assessment Details Patient Name: Date of Service: [name], [place] 10/14/2021 10:30 A M Medical Record Number: [id number] Patient Account Number: [id number] Date of Birth/Sex: Treating RN: [birth year] (46 y.o. [name] Primary Care Akeila Lana: [name] Other Clinician: Referring Jakeel Starliper: Treating Ulyssa Walthour/Extender: [name] in Treatment: 46 Active Problems Location of Pain Severity and Description of Pain Patient Has Paino Yes Site Locations Pain Location: Generalized Pain, Pain in Ulcers With Dressing Change: Yes Duration of the Pain. Constant / Intermittento Intermittent Rate the pain. Current Pain Level: 4 Worst Pain Level: 10 Least Pain Level: 0 Tolerable Pain Level: 4 Character of Pain Describe the Pain: Aching Pain Management and Medication Current Pain Management: Medication: No Cold Application: No Rest: No Massage: No Activity: No T.E.N.S.: No Heat Application: No Leg drop or elevation: No Is the Current Pain Management Adequate: Adequate How does your wound impact your activities of daily livingo Sleep: No Bathing:  No Appetite: No Relationship With Others: No Bladder Continence: No Emotions: No Bowel Continence: No Work: No Toileting: No Drive: No Dressing: No Hobbies: No Electronic Signature(s) Signed: 10/14/2021 5:18:04 PM By: [name] RN Entered By: [name] on 10/14/2021 11:02:32 -------------------------------------------------------------------------------- Patient/Caregiver Education Details Patient Name: Date of Service: Ciro Backer 3/9/2023andnbsp10:30 [place] Record Number: [id number] Patient Account Number: [id number] Date of Birth/Gender: Treating RN: 1975-11-29 (46 y.o. [name] Primary Care Physician: [name] Other Clinician: Referring Physician: Treating Physician/Extender: [name] in Treatment: 52 Education Assessment Education Provided To: Patient Education Topics Provided Wound/Skin Impairment: Methods: Explain/Verbal Responses: State content correctly [employer]) Signed: 10/15/2021 1:38:56 PM By: Levan Hurst RN, BSN Entered By: Levan Hurst on 10/14/2021 11:39:29 -------------------------------------------------------------------------------- Wound Assessment Details Patient Name: Date of Service: [name], [place] 10/14/2021 10:30 A M Medical Record Number: [id number] Patient Account Number: [id number] Date of Birth/Sex: Treating RN: 08-10-1975 (46 y.o. [name], [name] Primary Care Hulen Mandler: [name] Other Clinician: Referring Sharice Harriss: Treating Ximena Todaro/Extender: [name] in Treatment: 65 Wound Status Wound Number: 1 Primary Etiology: Pressure Ulcer Wound Location: Right Calcaneus Wound Status: Open Wounding Event: Pressure Injury Comorbid History: Asthma, Angina, Hypertension Date Acquired: 10/07/2019 Weeks Of Treatment: 65 Clustered Wound: No Photos Wound Measurements Length: (cm) 4 Width: (cm) 1.7 Depth: (cm) 0.5 Area: (cm)  5.341 Volume: (cm) 2.67 % Reduction in Area: 83.1% % Reduction in Volume: 91.6% Epithelialization: Small (1-33%) Tunneling: No Undermining: No Wound Description Classification: Category/Stage III Wound Margin: Distinct, outline attached Exudate Amount: Medium Exudate Type: Serosanguineous Exudate Color: red, brown Foul Odor After Cleansing: No Slough/Fibrino Yes Wound Bed Granulation Amount: Medium (34-66%) Exposed Structure Granulation Quality: Pink Fascia Exposed: No Necrotic Amount: Medium (34-66%)  Fat Layer (Subcutaneous Tissue) Exposed: Yes Necrotic Quality: Adherent Slough Tendon Exposed: No Muscle Exposed: No Joint Exposed: No Bone Exposed: No Treatment Notes Wound #1 (Calcaneus) Wound Laterality: Right Cleanser Normal Saline Discharge Instruction: Cleanse the wound with Normal Saline prior to applying a clean dressing using gauze sponges, not tissue or cotton balls. Wound Cleanser Discharge Instruction: Cleanse the wound with wound cleanser prior to applying a clean dressing using gauze sponges, not tissue or cotton balls. Peri-Wound Care Topical Primary Dressing Hydrofera Blue Classic Foam, 2x2 in Discharge Instruction: Moisten with saline prior to applying to wound bed Secondary Dressing Zetuvit Plus 4x8 in Discharge Instruction: Apply over primary dressing as directed. Secured With Elastic Bandage 4 inch (ACE bandage) Discharge Instruction: Secure with ACE bandage as directed. Kerlix Roll Sterile, 4.5x3.1 (in/yd) Discharge Instruction: Secure with Kerlix as directed. Compression Wrap Compression Stockings Add-Ons Electronic Signature(s) Signed: 10/14/2021 5:18:04 PM By: [name] RN Entered By: [name] on 10/14/2021 11:19:33 -------------------------------------------------------------------------------- Wound Assessment Details Patient Name: Date of Service: [name], [place] 10/14/2021 10:30 A M Medical Record Number:  [id number] Patient Account Number: [id number] Date of Birth/Sex: Treating RN: March 31, 1976 (46 y.o. [name], [name] Primary Care Jeshua Ransford: [name] Other Clinician: Referring Genia Perin: Treating Ralynn San/Extender: [name] in Treatment: 65 Wound Status Wound Number: 2 Primary Etiology: Pressure Ulcer Wound Location: Left Calcaneus Wound Status: Open Wounding Event: Pressure Injury Comorbid History: Asthma, Angina, Hypertension Date Acquired: 10/07/2019 Weeks Of Treatment: 65 Clustered Wound: No Photos Wound Measurements Length: (cm) 3 Width: (cm) 1 Depth: (cm) 0.2 Area: (cm) 2.356 Volume: (cm) 0.471 % Reduction in Area: 91.3% % Reduction in Volume: 98.3% Epithelialization: None Tunneling: No Undermining: No Wound Description Classification: Category/Stage III Wound Margin: Distinct, outline attached Exudate Amount: Medium Exudate Type: Serosanguineous Exudate Color: red, brown Foul Odor After Cleansing: No Slough/Fibrino Yes Wound Bed Granulation Amount: Large (67-100%) Exposed Structure Granulation Quality: Red Fascia Exposed: No Necrotic Amount: Small (1-33%) Fat Layer (Subcutaneous Tissue) Exposed: Yes Necrotic Quality: Adherent Slough Tendon Exposed: No Muscle Exposed: No Joint Exposed: No Bone Exposed: No Treatment Notes Wound #2 (Calcaneus) Wound Laterality: Left Cleanser Normal Saline Discharge Instruction: Cleanse the wound with Normal Saline prior to applying a clean dressing using gauze sponges, not tissue or cotton balls. Wound Cleanser Discharge Instruction: Cleanse the wound with wound cleanser prior to applying a clean dressing using gauze sponges, not tissue or cotton balls. Peri-Wound Care Zinc Oxide Ointment 30g tube Discharge Instruction: Apply Zinc Oxide to periwound with each dressing change Topical Primary Dressing Hydrofera Blue Classic Foam, 2x2 in Discharge Instruction: Moisten with saline prior to  applying to wound bed Secondary Dressing Zetuvit Plus 4x8 in Discharge Instruction: Apply over primary dressing as directed. Secured With [employer], 4.5x3.1 (in/yd) Discharge Instruction: Secure with Kerlix as directed. 33M Medipore H Soft Cloth Surgical T ape, 4 x 10 (in/yd) Discharge Instruction: Secure with tape as directed. Compression Wrap Compression Stockings Add-Ons Electronic Signature(s) Signed: 10/14/2021 5:18:04 PM By: [name] RN Entered By: [name] on 10/14/2021 11:20:00 -------------------------------------------------------------------------------- Vitals Details Patient Name: Date of Service: [name], [place] 10/14/2021 10:30 A M Medical Record Number: [id number] Patient Account Number: [id number] Date of Birth/Sex: Treating RN: 1975/11/15 (46 y.o. [name] Primary Care Nesta Scaturro: [name] Other Clinician: Referring Devika Dragovich: Treating Queenie Aufiero/Extender: [name] in Treatment: 20 Vital Signs Time Taken: 10:59 Temperature (F): 98.4 Height (in): 69 Pulse (bpm): 71 Weight (lbs): 280 Respiratory Rate (breaths/min): 17 Body  Mass Index (BMI): 41.3 Blood Pressure (mmHg): 128/73 Reference Range: 80 - 120 mg / dl Electronic Signature(s) Signed: 10/14/2021 5:18:04 PM By: [name] RN Entered By: [name] on 10/14/2021 11:01:16

## 2021-10-15 NOTE — Progress Notes (Signed)
LATRON, RIBAS ([id number]) Visit Report for 10/14/2021 Chief Complaint Document Details Patient Name: Date of Service: [name], [place] 10/14/2021 10:30 A M Medical Record Number: [id number] Patient Account Number: [id number] Date of Birth/Sex: Treating RN: [birth year] (46 y.o. M) Primary Care Provider: [name] Other Clinician: Referring Provider: Treating Provider/Extender: [name] in Treatment: 78 Information Obtained from: Patient Chief Complaint Bilateral Plantar Foot Ulcers Electronic Signature(s) Signed: 10/14/2021 12:46:10 PM By: Fredirick Maudlin MD FACS Entered By: Fredirick Maudlin on 10/14/2021 12:46:10 -------------------------------------------------------------------------------- Debridement Details Patient Name: Date of Service: [name], [place] 10/14/2021 10:30 A M Medical Record Number: [id number] Patient Account Number: [id number] Date of Birth/Sex: Treating RN: 07-31-1976 (46 y.o. [name] Primary Care Provider: [name] Other Clinician: Referring Provider: Treating Provider/Extender: [name] in Treatment: 65 Debridement Performed for Assessment: Wound #1 Right Calcaneus Performed By: Physician Fredirick Maudlin, MD Debridement Type: Debridement Level of Consciousness (Pre-procedure): Awake and Alert Pre-procedure Verification/Time Out Yes - 11:35 Taken: Start Time: 11:35 T Area Debrided (L x W): otal 4 (cm) x 1.7 (cm) = 6.8 (cm) Tissue and other material debrided: Non-Viable, Callus, Slough, Slough Level: Non-Viable Tissue Debridement Description: Selective/Open Wound Instrument: Curette Bleeding: Minimum Hemostasis Achieved: Pressure End Time: 11:38 Procedural Pain: 0 Post Procedural Pain: 0 Response to Treatment: Procedure was tolerated well Level of Consciousness (Post- Awake and Alert procedure): Post Debridement Measurements of Total Wound Length: (cm) 4 Stage: Category/Stage  III Width: (cm) 1.7 Depth: (cm) 0.5 Volume: (cm) 2.67 Character of Wound/Ulcer Post Debridement: Improved Post Procedure Diagnosis Same as Pre-procedure Electronic Signature(s) Signed: 10/15/2021 8:31:39 AM By: Fredirick Maudlin MD FACS Signed: 10/15/2021 1:38:56 PM By: Levan Hurst RN, BSN Entered By: Levan Hurst on 10/14/2021 11:37:59 -------------------------------------------------------------------------------- Debridement Details Patient Name: Date of Service: [name], [place] 10/14/2021 10:30 A M Medical Record Number: [id number] Patient Account Number: [id number] Date of Birth/Sex: Treating RN: 01-10-1976 (46 y.o. [name] Primary Care Provider: [name] Other Clinician: Referring Provider: Treating Provider/Extender: [name] in Treatment: 65 Debridement Performed for Assessment: Wound #2 Left Calcaneus Performed By: Physician Fredirick Maudlin, MD Debridement Type: Debridement Level of Consciousness (Pre-procedure): Awake and Alert Pre-procedure Verification/Time Out Yes - 11:35 Taken: Start Time: 11:35 T Area Debrided (L x W): otal 3 (cm) x 1 (cm) = 3 (cm) Tissue and other material debrided: Non-Viable, Callus, Slough, Slough Level: Non-Viable Tissue Debridement Description: Selective/Open Wound Instrument: Curette Bleeding: Minimum Hemostasis Achieved: Pressure End Time: 11:38 Procedural Pain: 0 Post Procedural Pain: 0 Response to Treatment: Procedure was tolerated well Level of Consciousness (Post- Awake and Alert procedure): Post Debridement Measurements of Total Wound Length: (cm) 3 Stage: Category/Stage III Width: (cm) 1 Depth: (cm) 0.2 Volume: (cm) 0.471 Character of Wound/Ulcer Post Debridement: Improved Post Procedure Diagnosis Same as Pre-procedure Electronic Signature(s) Signed: 10/15/2021 8:31:39 AM By: Fredirick Maudlin MD FACS Signed: 10/15/2021 1:38:56 PM By: Levan Hurst RN, BSN Entered By:  Levan Hurst on 10/14/2021 11:38:25 -------------------------------------------------------------------------------- HPI Details Patient Name: Date of Service: [name], [place]. 10/14/2021 10:30 A M Medical Record Number: [id number] Patient Account Number: [id number] Date of Birth/Sex: Treating RN: 1976-08-04 (46 y.o. M) Primary Care Provider: [name] Other Clinician: Referring Provider: Treating Provider/Extender: [name] in Treatment: 21 History of Present Illness HPI Description: Wounds are12/03/2020 upon evaluation today patient presents for initial inspection here in our clinic concerning issues he has been having with the bottoms of his  feet bilaterally. He states these actually occurred as wounds when he was hospitalized for 5 months secondary to Covid. He was apparently with tilting bed where he was in an upright position quite frequently and apparently this occurred in some way shape or form during that time. Fortunately there is no sign of active infection at this time. No fevers, chills, nausea, vomiting, or diarrhea. With that being said he still has substantial wounds on the plantar aspects of his feet Theragen require quite a bit of work to get these to heal. He has been using Santyl currently though that is been problematic both in receiving the medication as well as actually paid for it as it is become quite expensive. Prior to the experience with Covid the patient really did not have any major medical problems other than hypertension he does have some mild generalized weakness following the Covid experience. 07/22/2020 on evaluation today patient appears to be doing okay in regard to his foot ulcers I feel like the wound beds are showing signs of better improvement that I do believe the Iodoflex is helping in this regard. With that being said he does have a lot of drainage currently and this is somewhat blue/green in nature which is consistent  with Pseudomonas. I do think a culture today would be appropriate for [place] to evaluate and see if that is indeed the case I would likely start him on antibiotic orally as well he is not allergic to Cipro knows of no issues he has had in the past 12/21; patient was admitted to the clinic earlier this month with bilateral presumed pressure ulcers on the bottom of his feet apparently related to excessive pressure from a tilt table arrangement in the intensive care unit. Patient relates this to being on ECMO but I am not really sure that is exactly related to that. I must say I have never seen anything like this. He has fairly extensive full-thickness wounds extending from his heel towards his midfoot mostly centered laterally. There is already been some healing distally. He does not appear to have an arterial issue. He has been using gentamicin to the wound surfaces with Iodoflex to help with ongoing debridement 1/6; this is a patient with pressure ulcers on the bottom of his feet related to excessive pressure from a standing position in the intensive care unit. He is complaining of a lot of pain in the right heel. He is not a diabetic. 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 We have been using Iodoflex to help prepare the surfaces of both wounds for an advanced treatment product. He is nonambulatory spending most of his time in a wheelchair I have asked him not to propel the wheelchair with his heels 1/13; in general his wounds look better not much surface area change we have been using Iodoflex as of last week. I did an x-ray of the right heel as the patient was complaining of pain. I had some thoughts about a stress fracture perhaps Achilles tendon problems however what it showed was erosive changes along the inferior aspect of the calcaneus he now has a MRI booked for 1/20. 1/20; in general his wounds continue to be better. Some improvement in the large narrow areas proximally  in his foot. He is still complaining of pain in the right heel and tenderness in certain areas of this wound. His MRI is tonight. I am not just looking for osteomyelitis that was brought up on the x-ray I am  wondering about stress fractures, tendon ruptures etc. He has no such findings on the left. Also noteworthy is that the patient had critical illness neuropathy and some of the discomfort may be actual improvement in nerve function I am just not sure. These wounds were initially in the setting of severe critical illness related to COVID-19. He was put in a standing position. He may have also been on pressors at the point contributing to tissue ischemia. By his description at some point these wounds were grossly necrotic extending proximally up into the Achilles part of his heel. I do not know that I have ever really seen pictures of them like this although they may exist in epic We have ordered Tri layer Oasis. I am trying to stimulate some granulation in these areas. This is of course assuming the MRI is negative for infection 1/27; since the patient was last here he saw Dr. Juleen China of infectious disease. He is planned for vancomycin and ceftriaxone. Prior operative culture grew MSSA. Also ordered baseline lab work. He also ordered arterial studies although the ABIs in our clinic were normal as well as his clinical exam these were normal I do not think he needs to see vascular surgery. His ABIs at the P[facility] were 1.22 in the right triphasic waveforms with a normal TBI of 1.15 on the left ABI of 1.22 with triphasic waveforms and a normal TBI of 1.08. Finally he saw Dr. Amalia Hailey who will follow him in for 2 months. At this point I do not think he felt that he needed a procedure on the right calcaneal bone. Dr. Juleen China is elected for broad-spectrum antibiotic The patient is still having pain in the right heel. He walks with a walker 2/3; wounds are generally smaller. He is tolerating his IV antibiotics. I  believe this is vancomycin and ceftriaxone. We are still waiting for Oasis burn in terms of his out-of-pocket max which he should be meeting soon given the IV antibiotics, MRIs etc. I have asked him to check in on this. We are using silver collagen in the meantime the wounds look better 2/10; tolerating IV vancomycin and Rocephin. We are waiting to apply for Oasis. Although I am not really sure where he is in his out-of-pocket max. 2/17 started the first application of Oasis trilayer. Still on antibiotics. The wounds have generally look better. The area on the left has a little more surface slough requiring debridement 2/42; second application of Oasis trilayer. The wound surface granulation is generally look better. The area on the left with undermining laterally I think is come in a bit. 10/08/2020 upon evaluation today patient is here today for [employer] application #3. Fortunately he seems to be doing extremely well with regard to this and we are seeing a lot of new epithelial growth which is great news. Fortunately there is no signs of active infection at this time. 10/16/2020 upon evaluation today patient appears to be doing well with regard to his foot ulcers. Do believe the Oasis has been of benefit for him. I do not see any signs of infection right now which is great news and I think that he has a lot of new epithelial growth which is great to see as well. The patient is very pleased to hear all of this. I do think we can proceed with the Oasis trilayer #4 today. 3/18; not as much improvement in these areas on his heels that I was hoping. I did reapply trilateral Oasis today the tissue  looks healthier but not as much fill in as I was hoping. 3/25; better looking today I think this is come in a bit the tissue looks healthier. Triple layer Oasis reapplied #6 4/1; somewhat better looking definitely better looking surface not as much change in surface area as I was hoping. He may be spending  more time [place] on days then he needs to although he does have heel offloading boots. Triple layer Oasis reapplied #7 4/7; unfortunately apparently [facility] will not approve any further Oasis which is unfortunate since the patient did respond nicely both in terms of the condition of the wound bed as well as surface area. There is still some drainage coming from the wound but not a lot there does not appear to be any infection 4/15; we have been using Hydrofera Blue. He continues to have nice rims of epithelialization on the right greater than the left. The left the epithelialization is coming from the tip of his heel. There is moderate drainage. In this that concerns me about a total contact cast. There is no evidence of infection 4/29; patient has been using Hydrofera Blue with dressing changes. He has no complaints or issues today. 5/5; using Hydrofera Blue. I actually think that he looks marginally better than the last time I saw this 3 weeks ago. There are rims of epithelialization on the left thumb coming from the medial side on the right. Using Hydrofera Blue 5/12; using Hydrofera Blue. These continue to make improvements in surface area. His drainage was not listed as severe I therefore went ahead and put a cast on the left foot. Right foot we will continue to dress his previous 5/16; back for first total contact cast change. He did not tolerate this particularly well cast injury on the anterior tibia among other issues. Difficulty sleeping. I talked him about this in some detail and afterwards is elected to continue. I told him I would like to have a cast on for 3 weeks to see if this is going to help at all. I think he agreed 5/19; I think the wound is better. There is no tunneling towards his midfoot. The undermining medially also looks better. He has a rim of new skin distally. I think we are making progress here. The area on the left also continues to look somewhat better to  me using Hydrofera Blue. He has a list of complaints about the cast but none of them seem serious 5/26; patient presents for 1 week follow-up. He has been using a total contact cast and tolerating this well. Hydrofera Blue is the main dressing used. He denies signs of infection. 6/2 Hydrofera Blue total contact cast on the left. These were large ulcers that formed in intensive care unit where the patient was recovering from [place]. May have had something to do with being ventilated in an upright positiono Pressors etc. We have been able to get the areas down considerably and a viable surface. There is some epithelialization in both sides. Note made of drainage 6/9; changed to [facility] last time because of drainage. He arrives with better looking surfaces and dimensions on the left than the right. Paradoxically the right actually probes more towards his midfoot the left is largely close down but both of these look improved. Using a total contact cast on the left 6/16; complex wounds on his bilateral plantar heels which were initially pressure injury from a stay in the ICU with COVID. We have been using silver  alginate most recently. His dimensions of come in quite dramatically however not recently. We have been putting the left foot in a total contact cast 6/23; complex wounds on the bilateral plantar heels. I been putting the left in the cast paradoxically the area on the right is the one that is going towards closure at a faster rate. Quite a bit of drainage on the left. The patient went to see Dr. Amalia Hailey who said he was going to standby for skin grafts. I had actually considered sending him for skin grafts however he would be mandatorily off his feet for a period of weeks to months. I am thinking that the area on the right is going to close on its own the area on the left has been more stubborn even though we have him in a total contact cast 6/30; took him out of a total contact cast last week  is the right heel seem to be making better progress than the left where I was placing the cast. We are using silver alginate. Both wounds are smaller right greater than left 7/12; both wounds look as though they are making some progress. We are using silver alginate. Heel offloading boots 7/26; very gradual progress especially on the right. Using silver alginate. He is wearing heel offloading boots 8/18; he continues to close these wounds down very gradually. Using silver alginate. The problem polymen being definitive about this is areas of what appears to be callus around the margins. This is not a 100% of the area but certainly sizable especially on the right 9/1; bilateral plantar feet wounds secondary to prolonged pressure while being ventilated for COVID-19 in an upright position. Essentially pressure ulcers on the bottom of his feet. He is made substantial progress using silver alginate. 9/14; bilateral plantar feet wounds secondary to prolonged pressure. Making progress using silver alginate. 9/29 bilateral plantar feet wounds secondary to prolonged pressure. I changed him to Iodoflex last week. MolecuLight showing reddened blush fluorescence 10/11; patient presents for follow-up. He has no issues or complaints today. He denies signs of infection. He continues to use Iodoflex and antibiotic ointment to the wound beds. 10/27; 2-week follow-up. No evidence of infection. He has callus and thick dry skin around the wound margins we have been using Iodoflex and Bactroban which was in response to a moderate left MolecuLight reddish blush fluorescence. 11/10; 2-week follow-up. Wound margins again have thick callus however the measurements of the actual wound sites are a lot smaller. Everything looks reasonably healthy here. We have been using Iodoflex He was approved for prime matrix but I have elected to delay this given the improvement in the surface area. Hopefully I will not regret that decision  as were getting close to the end of the year in terms of insurance payment 12/8; 2-week follow-up. Wounds are generally smaller in size. These were initially substantial wounds extending into the forefoot all the way into the heel on the bilateral plantar feet. They are now both located on the plantar heel distal aspect both of these have a lot of callus around the wounds I used a #5 curette to remove this on the right and the left also some subcutaneous debris to try and get the wound edges were using Iodoflex. He has heel offloading shoe 12/22; 2-week follow-up. Not really much improvement. He has thick callus around the outer edges of both wounds. I remove this there is some nonviable subcutaneous tissue as well. We have been using Iodoflex. Her intake nurse  and myself spontaneously thought of a total contact cast I went back in May. At that time we really were not seeing much of an improvement with a cast although the wound was in a much different situation I would like to retry this in 2 weeks and I discussed this with the patient 08/12/2021; the patient has had some improvement with the Iodoflex. The the area on the left heel plantar more improved than the right. I had to put him in a total contact cast on the left although I decided to put that off for 2 weeks. I am going to change his primary dressing to silver collagen. I think in both areas he has had some improvement most of the healing seems to be more proximal in the heel. The wounds are in the mid aspect. A lot of thick callus on the right heel however. 1/19; we are using silver collagen on both plantar heel areas. He has had some improvement today. The left did not require any debridement. He still had some eschar on the right that was debrided but both seem to have contracted. I did not put it total contact cast on him today 2/2 we have been using silver collagen. The area on the right plantar heel has areas that appear to be  epithelialized interspersed with dry flaking callus and dry skin. I removed this. This really looks better than on the other side. On the left still a large area with raised edges and debris on the surface. 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 He has no issues or complaints today. 2/9; not much change in the left foot wound with 1 week of a cast we are using silver collagen. Silver collagen on the right side. The right side has been the better wound surface. We will reapply the total contact cast on the left 2/16; not much improvement on either side I been using silver collagen with a total contact cast on the left. I'm changing the Hydrofera Blue still with a total contact cast on the left 2/23; some improvement on both sides. Disappointing that he has thick callus around the area that we are putting in a total contact cast on the left. We've been using Hydrofera Blue on both wound areas. This is a man who at essentially pressure ulcers in addition to ischemia caused by medications to support his blood pressure (pressors) in the ICU. He was being ventilated in the standing position for severe Covid. A [place] the wounds extended across his entire foot but are now localized to his plantar heels bilaterally. We have made progress however neither areas healed. I continue to think the total contact cast is helped albeit painstakingly slowly. He has never wanted a plastic surgery consult although I don't know that they would be interested in grafting in area in this location. 10/07/2021: Continued improvement bilaterally. There is still some callus around the left wound, despite the total contact cast. He has some increased pain in his right midfoot around 1 particular area. This has been painful in the past but seems to be a little bit worse. When his cast was removed  today, there was an area on the heel of the left foot that looks a bit macerated. He is also complaining of pain in his left thigh and hip which he thinks is secondary to the limb  length discrepancy caused by the cast. 10/14/2021: He continues to improve. A little bit less callus around the left wound. He continues to endorse pain in his right midfoot, but this is not as significant as it was last week. The maceration on his left heel is improved. Electronic Signature(s) Signed: 10/14/2021 12:47:41 PM By: Fredirick Maudlin MD FACS Entered By: Fredirick Maudlin on 10/14/2021 12:47:41 -------------------------------------------------------------------------------- Physical Exam Details Patient Name: Date of Service: [name], [place] 10/14/2021 10:30 A M Medical Record Number: [id number] Patient Account Number: [id number] Date of Birth/Sex: Treating RN: 1976/05/03 (47 y.o. M) Primary Care Provider: [name] Other Clinician: Referring Provider: Treating Provider/Extender: [name] in Treatment: 54 Constitutional . . . . No acute distress. Respiratory Normal work of breathing on room air. Notes 10/14/2021: Wound examcontinued improvement bilaterally. Less maceration on the left posterior heel. Less callus forming, compared to last week. The right side also continues to close. A little bit more callus on the side, but it is not in a cast. Electronic Signature(s) Signed: 10/14/2021 12:49:20 PM By: Fredirick Maudlin MD FACS Entered By: Fredirick Maudlin on 10/14/2021 12:49:20 -------------------------------------------------------------------------------- Physician Orders Details Patient Name: Date of Service: [name], [place] 10/14/2021 10:30 A M Medical Record Number: [id number] Patient Account Number: [id number] Date of Birth/Sex: Treating RN: 03-30-1976 (46 y.o. [name] Primary Care Provider: [name] Other Clinician: Referring Provider: Treating  Provider/Extender: [name] in Treatment: 52 Verbal / Phone Orders: No Diagnosis Coding ICD-10 Coding Code Description [phone] Non-pressure chronic ulcer of other part of right foot with fat layer exposed L97.522 Non-pressure chronic ulcer of other part of left foot with fat layer exposed Follow-up Appointments ppointment in 1 week. - Dr [name] Thursday Return A Bathing/ Shower/ Hygiene May shower with protection but do not get wound dressing(s) wet. - Cover with cast protector (can purchase cast protector at [facility] or [facility] ) Edema Control - Lymphedema / SCD / Other Bilateral Lower Extremities Elevate legs to the level of the heart or above for 30 minutes daily and/or when sitting, a frequency of: - throughout the day Avoid standing for long periods of time. Moisturize legs daily. - right leg and foot every night. Off-Loading Total Contact Cast to Left Lower Extremity Wedge shoe to: - Glophed Shoe to right foot Other: - keep pressure off of the bottom of your feet Additional Orders / Instructions Follow Nutritious Diet Wound Treatment Wound #1 - Calcaneus Wound Laterality: Right Cleanser: Normal Saline (Generic) Every Other Day/30 Days Discharge Instructions: Cleanse the wound with Normal Saline prior to applying a clean dressing using gauze sponges, not tissue or cotton balls. Cleanser: Wound Cleanser Every Other Day/30 Days Discharge Instructions: Cleanse the wound with wound cleanser prior to applying a clean dressing using gauze sponges, not tissue or cotton balls. Prim Dressing: Hydrofera Blue Classic Foam, 2x2 in Every Other Day/30 Days ary Discharge Instructions: Moisten with saline prior to applying to wound bed Secondary Dressing: Zetuvit Plus 4x8 in (DME) (Generic) Every Other Day/30 Days Discharge Instructions: Apply over primary dressing as directed. Secured With: Elastic Bandage 4 inch (ACE bandage) (Generic) Every Other Day/30  Days Discharge Instructions: Secure with ACE bandage as directed. Secured With: [employer], 4.5x3.1 (in/yd) (Generic) Every Other Day/30 Days Discharge Instructions: Secure with Kerlix as directed. Wound #2 - Calcaneus Wound Laterality: Left Cleanser: Normal Saline (Generic) 1 x Per Week/30 Days Discharge Instructions: Cleanse the wound with Normal Saline prior to applying a  clean dressing using gauze sponges, not tissue or cotton balls. Cleanser: Wound Cleanser 1 x Per Week/30 Days Discharge Instructions: Cleanse the wound with wound cleanser prior to applying a clean dressing using gauze sponges, not tissue or cotton balls. Peri-Wound Care: Zinc Oxide Ointment 30g tube 1 x Per Week/30 Days Discharge Instructions: Apply Zinc Oxide to periwound with each dressing change Prim Dressing: Hydrofera Blue Classic Foam, 2x2 in 1 x Per Week/30 Days ary Discharge Instructions: Moisten with saline prior to applying to wound bed Secondary Dressing: Zetuvit Plus 4x8 in (Generic) 1 x Per Week/30 Days Discharge Instructions: Apply over primary dressing as directed. Secured With: [employer], 4.5x3.1 (in/yd) (Generic) 1 x Per Week/30 Days Discharge Instructions: Secure with Kerlix as directed. Secured With: 19M Medipore H Soft Cloth Surgical T ape, 4 x 10 (in/yd) 1 x Per Week/30 Days Discharge Instructions: Secure with tape as directed. Electronic Signature(s) Signed: 10/15/2021 8:31:39 AM By: Fredirick Maudlin MD FACS Entered By: Fredirick Maudlin on 10/14/2021 12:49:36 -------------------------------------------------------------------------------- Problem List Details Patient Name: Date of Service: [name], [place] 10/14/2021 10:30 A M Medical Record Number: [id number] Patient Account Number: [id number] Date of Birth/Sex: Treating RN: [birth year] (46 y.o. [name] Primary Care Provider: [name] Other Clinician: Referring Provider: Treating Provider/Extender: [name] in Treatment: 76 Active Problems ICD-10 Encounter Code Description Active Date MDM Diagnosis L97.512 Non-pressure chronic ulcer of other part of right foot with fat layer exposed 09/03/2020 No Yes L97.522 Non-pressure chronic ulcer of other part of left foot with fat layer exposed 09/03/2020 No Yes Inactive Problems ICD-10 Code Description Active Date Inactive Date L89.893 Pressure ulcer of other site, stage 3 07/15/2020 07/15/2020 M62.81 Muscle weakness (generalized) 07/15/2020 07/15/2020 I10 Essential (primary) hypertension 07/15/2020 07/15/2020 M86.171 Other acute osteomyelitis, right ankle and foot 09/03/2020 09/03/2020 Resolved Problems Electronic Signature(s) Signed: 10/14/2021 12:44:29 PM By: Fredirick Maudlin MD FACS Entered By: Fredirick Maudlin on 10/14/2021 12:44:29 -------------------------------------------------------------------------------- Progress Note Details Patient Name: Date of Service: [name], [place]. 10/14/2021 10:30 A M Medical Record Number: [id number] Patient Account Number: [id number] Date of Birth/Sex: Treating RN: Aug 17, 1975 (46 y.o. M) Primary Care Provider: [name] Other Clinician: Referring Provider: Treating Provider/Extender: [name] in Treatment: 4 Subjective Chief Complaint Information obtained from Patient Bilateral Plantar Foot Ulcers History of Present Illness (HPI) Wounds are12/03/2020 upon evaluation today patient presents for initial inspection here in our clinic concerning issues he has been having with the bottoms of his feet bilaterally. He states these actually occurred as wounds when he was hospitalized for 5 months secondary to Covid. He was apparently with tilting bed where he was in an upright position quite frequently and apparently this occurred in some way shape or form during that time. Fortunately there is no sign of active infection at this time. No fevers, chills, nausea,  vomiting, or diarrhea. With that being said he still has substantial wounds on the plantar aspects of his feet Theragen require quite a bit of work to get these to heal. He has been using Santyl currently though that is been problematic both in receiving the medication as well as actually paid for it as it is become quite expensive. Prior to the experience with Covid the patient really did not have any major medical problems other than hypertension he does have some mild generalized weakness following the Covid experience. 07/22/2020 on evaluation today patient appears to be doing okay in regard to his foot ulcers I feel  like the wound beds are showing signs of better improvement that I do believe the Iodoflex is helping in this regard. With that being said he does have a lot of drainage currently and this is somewhat blue/green in nature which is consistent with Pseudomonas. I do think a culture today would be appropriate for [place] to evaluate and see if that is indeed the case I would likely start him on antibiotic orally as well he is not allergic to Cipro knows of no issues he has had in the past 12/21; patient was admitted to the clinic earlier this month with bilateral presumed pressure ulcers on the bottom of his feet apparently related to excessive pressure from a tilt table arrangement in the intensive care unit. Patient relates this to being on ECMO but I am not really sure that is exactly related to that. I must say I have never seen anything like this. He has fairly extensive full-thickness wounds extending from his heel towards his midfoot mostly centered laterally. There is already been some healing distally. He does not appear to have an arterial issue. He has been using gentamicin to the wound surfaces with Iodoflex to help with ongoing debridement 1/6; this is a patient with pressure ulcers on the bottom of his feet related to excessive pressure from a standing position in the intensive  care unit. He is complaining of a lot of pain in the right heel. He is not a diabetic. 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 We have been using Iodoflex to help prepare the surfaces of both wounds for an advanced treatment product. He is nonambulatory spending most of his time in a wheelchair I have asked him not to propel the wheelchair with his heels 1/13; in general his wounds look better not much surface area change we have been using Iodoflex as of last week. I did an x-ray of the right heel as the patient was complaining of pain. I had some thoughts about a stress fracture perhaps Achilles tendon problems however what it showed was erosive changes along the inferior aspect of the calcaneus he now has a MRI booked for 1/20. 1/20; in general his wounds continue to be better. Some improvement in the large narrow areas proximally in his foot. He is still complaining of pain in the right heel and tenderness in certain areas of this wound. His MRI is tonight. I am not just looking for osteomyelitis that was brought up on the x-ray I am wondering about stress fractures, tendon ruptures etc. He has no such findings on the left. Also noteworthy is that the patient had critical illness neuropathy and some of the discomfort may be actual improvement in nerve function I am just not sure. These wounds were initially in the setting of severe critical illness related to COVID-19. He was put in a standing position. He may have also been on pressors at the point contributing to tissue ischemia. By his description at some point these wounds were grossly necrotic extending proximally up into the Achilles part of his heel. I do not know that I have ever really seen pictures of them like this although they may exist in epic We have ordered Tri layer Oasis. I am trying to stimulate some granulation in these areas. This is of course assuming the MRI is negative for infection 1/27; since the  patient was last here he saw Dr. Juleen China of infectious disease. He is planned for vancomycin and ceftriaxone. Prior operative culture  grew MSSA. Also ordered baseline lab work. He also ordered arterial studies although the ABIs in our clinic were normal as well as his clinical exam these were normal I do not think he needs to see vascular surgery. His ABIs at the P[facility] were 1.22 in the right triphasic waveforms with a normal TBI of 1.15 on the left ABI of 1.22 with triphasic waveforms and a normal TBI of 1.08. Finally he saw Dr. Amalia Hailey who will follow him in for 2 months. At this point I do not think he felt that he needed a procedure on the right calcaneal bone. Dr. Juleen China is elected for broad-spectrum antibiotic The patient is still having pain in the right heel. He walks with a walker 2/3; wounds are generally smaller. He is tolerating his IV antibiotics. I believe this is vancomycin and ceftriaxone. We are still waiting for Oasis burn in terms of his out-of-pocket max which he should be meeting soon given the IV antibiotics, MRIs etc. I have asked him to check in on this. We are using silver collagen in the meantime the wounds look better 2/10; tolerating IV vancomycin and Rocephin. We are waiting to apply for Oasis. Although I am not really sure where he is in his out-of-pocket max. 2/17 started the first application of Oasis trilayer. Still on antibiotics. The wounds have generally look better. The area on the left has a little more surface slough requiring debridement 1/02; second application of Oasis trilayer. The wound surface granulation is generally look better. The area on the left with undermining laterally I think is come in a bit. 10/08/2020 upon evaluation today patient is here today for [employer] application #3. Fortunately he seems to be doing extremely well with regard to this and we are seeing a lot of new epithelial growth which is great news. Fortunately there is no signs of  active infection at this time. 10/16/2020 upon evaluation today patient appears to be doing well with regard to his foot ulcers. Do believe the Oasis has been of benefit for him. I do not see any signs of infection right now which is great news and I think that he has a lot of new epithelial growth which is great to see as well. The patient is very pleased to hear all of this. I do think we can proceed with the Oasis trilayer #4 today. 3/18; not as much improvement in these areas on his heels that I was hoping. I did reapply trilateral Oasis today the tissue looks healthier but not as much fill in as I was hoping. 3/25; better looking today I think this is come in a bit the tissue looks healthier. Triple layer Oasis reapplied #6 4/1; somewhat better looking definitely better looking surface not as much change in surface area as I was hoping. He may be spending more time [place] on days then he needs to although he does have heel offloading boots. Triple layer Oasis reapplied #7 4/7; unfortunately apparently [facility] will not approve any further Oasis which is unfortunate since the patient did respond nicely both in terms of the condition of the wound bed as well as surface area. There is still some drainage coming from the wound but not a lot there does not appear to be any infection 4/15; we have been using Hydrofera Blue. He continues to have nice rims of epithelialization on the right greater than the left. The left the epithelialization is coming from the tip of his heel. There  is moderate drainage. In this that concerns me about a total contact cast. There is no evidence of infection 4/29; patient has been using Hydrofera Blue with dressing changes. He has no complaints or issues today. 5/5; using Hydrofera Blue. I actually think that he looks marginally better than the last time I saw this 3 weeks ago. There are rims of epithelialization on the left thumb coming from the medial side  on the right. Using Hydrofera Blue 5/12; using Hydrofera Blue. These continue to make improvements in surface area. His drainage was not listed as severe I therefore went ahead and put a cast on the left foot. Right foot we will continue to dress his previous 5/16; back for first total contact cast change. He did not tolerate this particularly well cast injury on the anterior tibia among other issues. Difficulty sleeping. I talked him about this in some detail and afterwards is elected to continue. I told him I would like to have a cast on for 3 weeks to see if this is going to help at all. I think he agreed 5/19; I think the wound is better. There is no tunneling towards his midfoot. The undermining medially also looks better. He has a rim of new skin distally. I think we are making progress here. The area on the left also continues to look somewhat better to me using Hydrofera Blue. He has a list of complaints about the cast but none of them seem serious 5/26; patient presents for 1 week follow-up. He has been using a total contact cast and tolerating this well. Hydrofera Blue is the main dressing used. He denies signs of infection. 6/2 Hydrofera Blue total contact cast on the left. These were large ulcers that formed in intensive care unit where the patient was recovering from [place]. May have had something to do with being ventilated in an upright positiono Pressors etc. We have been able to get the areas down considerably and a viable surface. There is some epithelialization in both sides. Note made of drainage 6/9; changed to [facility] last time because of drainage. He arrives with better looking surfaces and dimensions on the left than the right. Paradoxically the right actually probes more towards his midfoot the left is largely close down but both of these look improved. Using a total contact cast on the left 6/16; complex wounds on his bilateral plantar heels which were initially  pressure injury from a stay in the ICU with COVID. We have been using silver alginate most recently. His dimensions of come in quite dramatically however not recently. We have been putting the left foot in a total contact cast 6/23; complex wounds on the bilateral plantar heels. I been putting the left in the cast paradoxically the area on the right is the one that is going towards closure at a faster rate. Quite a bit of drainage on the left. The patient went to see Dr. Amalia Hailey who said he was going to standby for skin grafts. I had actually considered sending him for skin grafts however he would be mandatorily off his feet for a period of weeks to months. I am thinking that the area on the right is going to close on its own the area on the left has been more stubborn even though we have him in a total contact cast 6/30; took him out of a total contact cast last week is the right heel seem to be making better progress than the left where I was  placing the cast. We are using silver alginate. Both wounds are smaller right greater than left 7/12; both wounds look as though they are making some progress. We are using silver alginate. Heel offloading boots 7/26; very gradual progress especially on the right. Using silver alginate. He is wearing heel offloading boots 8/18; he continues to close these wounds down very gradually. Using silver alginate. The problem polymen being definitive about this is areas of what appears to be callus around the margins. This is not a 100% of the area but certainly sizable especially on the right 9/1; bilateral plantar feet wounds secondary to prolonged pressure while being ventilated for COVID-19 in an upright position. Essentially pressure ulcers on the bottom of his feet. He is made substantial progress using silver alginate. 9/14; bilateral plantar feet wounds secondary to prolonged pressure. Making progress using silver alginate. 9/29 bilateral plantar feet wounds  secondary to prolonged pressure. I changed him to Iodoflex last week. MolecuLight showing reddened blush fluorescence 10/11; patient presents for follow-up. He has no issues or complaints today. He denies signs of infection. He continues to use Iodoflex and antibiotic ointment to the wound beds. 10/27; 2-week follow-up. No evidence of infection. He has callus and thick dry skin around the wound margins we have been using Iodoflex and Bactroban which was in response to a moderate left MolecuLight reddish blush fluorescence. 11/10; 2-week follow-up. Wound margins again have thick callus however the measurements of the actual wound sites are a lot smaller. Everything looks reasonably healthy here. We have been using Iodoflex He was approved for prime matrix but I have elected to delay this given the improvement in the surface area. Hopefully I will not regret that decision as were getting close to the end of the year in terms of insurance payment 12/8; 2-week follow-up. Wounds are generally smaller in size. These were initially substantial wounds extending into the forefoot all the way into the heel on the bilateral plantar feet. They are now both located on the plantar heel distal aspect both of these have a lot of callus around the wounds I used a #5 curette to remove this on the right and the left also some subcutaneous debris to try and get the wound edges were using Iodoflex. He has heel offloading shoe 12/22; 2-week follow-up. Not really much improvement. He has thick callus around the outer edges of both wounds. I remove this there is some nonviable subcutaneous tissue as well. We have been using Iodoflex. Her intake nurse and myself spontaneously thought of a total contact cast I went back in May. At that time we really were not seeing much of an improvement with a cast although the wound was in a much different situation I would like to retry this in 2 weeks and I discussed this with the  patient 08/12/2021; the patient has had some improvement with the Iodoflex. The the area on the left heel plantar more improved than the right. I had to put him in a total contact cast on the left although I decided to put that off for 2 weeks. I am going to change his primary dressing to silver collagen. I think in both areas he has had some improvement most of the healing seems to be more proximal in the heel. The wounds are in the mid aspect. A lot of thick callus on the right heel however. 1/19; we are using silver collagen on both plantar heel areas. He has had some improvement today. The left did  not require any debridement. He still had some eschar on the right that was debrided but both seem to have contracted. I did not put it total contact cast on him today 2/2 we have been using silver collagen. The area on the right plantar heel has areas that appear to be epithelialized interspersed with dry flaking callus and dry skin. I removed this. This really looks better than on the other side. On the left still a large area with raised edges and debris on the surface. 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 He has no issues or complaints today. 2/9; not much change in the left foot wound with 1 week of a cast we are using silver collagen. Silver collagen on the right side. The right side has been the better wound surface. We will reapply the total contact cast on the left 2/16; not much improvement on either side I been using silver collagen with a total contact cast on the left. I'm changing the Hydrofera Blue still with a total contact cast on the left 2/23; some improvement on both sides. Disappointing that he has thick callus around the area that we are putting in a total contact cast on the left. We've been using Hydrofera Blue on both wound areas. This is a  man who at essentially pressure ulcers in addition to ischemia caused by medications to support his blood pressure (pressors) in the ICU. He was being ventilated in the standing position for severe Covid. A [place]iley the wounds extended across his entire foot but are now localized to his plantar heels bilaterally. We have made progress however neither areas healed. I continue to think the total contact cast is helped albeit painstakingly slowly. He has never wanted a plastic surgery consult although I don't know that they would be interested in grafting in area in this location. 10/07/2021: Continued improvement bilaterally. There is still some callus around the left wound, despite the total contact cast. He has some increased pain in his right midfoot around 1 particular area. This has been painful in the past but seems to be a little bit worse. When his cast was removed today, there was an area on the heel of the left foot that looks a bit macerated. He is also complaining of pain in his left thigh and hip which he thinks is secondary to the limb length discrepancy caused by the cast. 10/14/2021: He continues to improve. A little bit less callus around the left wound. He continues to endorse pain in his right midfoot, but this is not as significant as it was last week. The maceration on his left heel is improved. Patient History Information obtained from Patient. Family History Cancer - Maternal Grandparents, Diabetes - Father,Paternal Grandparents, Heart Disease - Maternal Grandparents, Hypertension - Father,Paternal Grandparents, Lung Disease - Siblings, No family history of Hereditary Spherocytosis, Kidney Disease, Seizures, Stroke, Thyroid Problems, Tuberculosis. Social History Never smoker, Marital Status - Married, Alcohol Use - Never, Drug Use - No History, Caffeine Use - Daily - tea, soda. Medical History Eyes Denies history of Cataracts, Glaucoma, Optic Neuritis Ear/Nose/Mouth/Throat Denies  history of Chronic sinus problems/congestion, Middle ear problems Hematologic/Lymphatic Denies history of Anemia, Hemophilia, Human Immunodeficiency Virus, Lymphedema, Sickle Cell Disease Respiratory Patient has history of Asthma Denies history of Aspiration, Chronic Obstructive Pulmonary Disease (COPD), Pneumothorax, Sleep Apnea, Tuberculosis Cardiovascular Patient has history  of Angina - with COVID, Hypertension Denies history of Arrhythmia, Congestive Heart Failure, Coronary Artery Disease, Deep Vein Thrombosis, Hypotension, Myocardial Infarction, Peripheral Arterial Disease, Peripheral Venous Disease, Phlebitis, Vasculitis Gastrointestinal Denies history of Cirrhosis , Colitis, Crohnoos, Hepatitis A, Hepatitis B, Hepatitis C Endocrine Denies history of Type I Diabetes, Type II Diabetes Genitourinary Denies history of End Stage Renal Disease Immunological Denies history of Lupus Erythematosus, Raynaudoos, Scleroderma Integumentary (Skin) Denies history of History of Burn Musculoskeletal Denies history of Gout, Rheumatoid Arthritis, Osteoarthritis, Osteomyelitis Neurologic Denies history of Dementia, Neuropathy, Quadriplegia, Paraplegia, Seizure Disorder Oncologic Denies history of Received Chemotherapy, Received Radiation Psychiatric Denies history of Anorexia/bulimia, Confinement Anxiety Hospitalization/Surgery History - COVID PNA 07/22/2019- 11/14/2019. - 03/27/2020 wound debridement/ skin graft. Medical A Surgical History Notes nd Constitutional Symptoms (General Health) COVID PNA 07/22/2019-11/14/2019 [facility] ECMO, foot drop left foot , Genitourinary kidney stone Psychiatric anxiety Objective Constitutional No acute distress. Vitals Time Taken: 10:59 AM, Height: 69 in, Weight: 280 lbs, BMI: 41.3, Temperature: 98.4 F, Pulse: 71 bpm, Respiratory Rate: 17 breaths/min, Blood Pressure: 128/73 mmHg. Respiratory Normal work of breathing on room air. General Notes:  10/14/2021: Wound examoocontinued improvement bilaterally. Less maceration on the left posterior heel. Less callus forming, compared to last week. The right side also continues to close. A little bit more callus on the side, but it is not in a cast. Integumentary (Hair, Skin) Wound #1 status is Open. Original cause of wound was Pressure Injury. The date acquired was: 10/07/2019. The wound has been in treatment 65 weeks. The wound is located on the Right Calcaneus. The wound measures 4cm length x 1.7cm width x 0.5cm depth; 5.341cm^2 area and 2.67cm^3 volume. There is Fat Layer (Subcutaneous Tissue) exposed. There is no tunneling or undermining noted. There is a medium amount of serosanguineous drainage noted. The wound margin is distinct with the outline attached to the wound base. There is medium (34-66%) pink granulation within the wound bed. There is a medium (34-66%) amount of necrotic tissue within the wound bed including Adherent Slough. Wound #2 status is Open. Original cause of wound was Pressure Injury. The date acquired was: 10/07/2019. The wound has been in treatment 65 weeks. The wound is located on the Left Calcaneus. The wound measures 3cm length x 1cm width x 0.2cm depth; 2.356cm^2 area and 0.471cm^3 volume. There is Fat Layer (Subcutaneous Tissue) exposed. There is no tunneling or undermining noted. There is a medium amount of serosanguineous drainage noted. The wound margin is distinct with the outline attached to the wound base. There is large (67-100%) red granulation within the wound bed. There is a small (1-33%) amount of necrotic tissue within the wound bed including Adherent Slough. Assessment Active Problems ICD-10 Non-pressure chronic ulcer of other part of right foot with fat layer exposed Non-pressure chronic ulcer of other part of left foot with fat layer exposed Procedures Wound #1 Pre-procedure diagnosis of Wound #1 is a Pressure Ulcer located on the Right Calcaneus .  There was a Selective/Open Wound Non-Viable Tissue Debridement with a total area of 6.8 sq cm performed by Fredirick Maudlin, MD. With the following instrument(s): Curette to remove Non-Viable tissue/material. Material removed includes Callus and Slough and. No specimens were taken. A time out was conducted at 11:35, prior to the start of the procedure. A Minimum amount of bleeding was controlled with Pressure. The procedure was tolerated well with a pain level of 0 throughout and a pain level of 0 following the procedure. Post Debridement Measurements: 4cm length x 1.7cm  width x 0.5cm depth; 2.67cm^3 volume. Post debridement Stage noted as Category/Stage III. Character of Wound/Ulcer Post Debridement is improved. Post procedure Diagnosis Wound #1: Same as Pre-Procedure Wound #2 Pre-procedure diagnosis of Wound #2 is a Pressure Ulcer located on the Left Calcaneus . There was a Selective/Open Wound Non-Viable Tissue Debridement with a total area of 3 sq cm performed by Fredirick Maudlin, MD. With the following instrument(s): Curette to remove Non-Viable tissue/material. Material removed includes Callus and Slough and. No specimens were taken. A time out was conducted at 11:35, prior to the start of the procedure. A Minimum amount of bleeding was controlled with Pressure. The procedure was tolerated well with a pain level of 0 throughout and a pain level of 0 following the procedure. Post Debridement Measurements: 3cm length x 1cm width x 0.2cm depth; 0.471cm^3 volume. Post debridement Stage noted as Category/Stage III. Character of Wound/Ulcer Post Debridement is improved. Post procedure Diagnosis Wound #2: Same as Pre-Procedure Pre-procedure diagnosis of Wound #2 is a Pressure Ulcer located on the Left Calcaneus . There was a T Contact Cast Procedure by Fredirick Maudlin, MD. otal Post procedure Diagnosis Wound #2: Same as Pre-Procedure Plan Follow-up Appointments: Return Appointment in 1 week. - Dr  [name] Thursday Bathing/ Shower/ Hygiene: May shower with protection but do not get wound dressing(s) wet. - Cover with cast protector (can purchase cast protector at [facility] or [facility] ) Edema Control - Lymphedema / SCD / Other: Elevate legs to the level of the heart or above for 30 minutes daily and/or when sitting, a frequency of: - throughout the day Avoid standing for long periods of time. Moisturize legs daily. - right leg and foot every night. Off-Loading: T Contact Cast to Left Lower Extremity otal Wedge shoe to: - Glophed Shoe to right foot Other: - keep pressure off of the bottom of your feet Additional Orders / Instructions: Follow Nutritious Diet WOUND #1: - Calcaneus Wound Laterality: Right Cleanser: Normal Saline (Generic) Every Other Day/30 Days Discharge Instructions: Cleanse the wound with Normal Saline prior to applying a clean dressing using gauze sponges, not tissue or cotton balls. Cleanser: Wound Cleanser Every Other Day/30 Days Discharge Instructions: Cleanse the wound with wound cleanser prior to applying a clean dressing using gauze sponges, not tissue or cotton balls. Prim Dressing: Hydrofera Blue Classic Foam, 2x2 in Every Other Day/30 Days ary Discharge Instructions: Moisten with saline prior to applying to wound bed Secondary Dressing: Zetuvit Plus 4x8 in (DME) (Generic) Every Other Day/30 Days Discharge Instructions: Apply over primary dressing as directed. Secured With: Elastic Bandage 4 inch (ACE bandage) (Generic) Every Other Day/30 Days Discharge Instructions: Secure with ACE bandage as directed. Secured With: [employer], 4.5x3.1 (in/yd) (Generic) Every Other Day/30 Days Discharge Instructions: Secure with Kerlix as directed. WOUND #2: - Calcaneus Wound Laterality: Left Cleanser: Normal Saline (Generic) 1 x Per Week/30 Days Discharge Instructions: Cleanse the wound with Normal Saline prior to applying a clean dressing using gauze sponges, not  tissue or cotton balls. Cleanser: Wound Cleanser 1 x Per Week/30 Days Discharge Instructions: Cleanse the wound with wound cleanser prior to applying a clean dressing using gauze sponges, not tissue or cotton balls. Peri-Wound Care: Zinc Oxide Ointment 30g tube 1 x Per Week/30 Days Discharge Instructions: Apply Zinc Oxide to periwound with each dressing change Prim Dressing: Hydrofera Blue Classic Foam, 2x2 in 1 x Per Week/30 Days ary Discharge Instructions: Moisten with saline prior to applying to wound bed Secondary Dressing: Zetuvit Plus 4x8 in (Generic)  1 x Per Week/30 Days Discharge Instructions: Apply over primary dressing as directed. Secured With: [employer], 4.5x3.1 (in/yd) (Generic) 1 x Per Week/30 Days Discharge Instructions: Secure with Kerlix as directed. Secured With: 35M Medipore H Soft Cloth Surgical T ape, 4 x 10 (in/yd) 1 x Per Week/30 Days Discharge Instructions: Secure with tape as directed. 10/14/2021: Wound examoocontinued improvement bilaterally. Less maceration on the left posterior heel. Less callus forming, compared to last week. The right side also continues to close. A little bit more callus on the side, but it is not in a cast. I debrided callus from both sides. We will continue with Hydrofera Blue to both wounds, with total contact cast on the left. Cast applied without any difficulty today. Follow-up in 1 week. Electronic Signature(s) Signed: 10/14/2021 12:50:11 PM By: Fredirick Maudlin MD FACS Entered By: Fredirick Maudlin on 10/14/2021 12:50:11 -------------------------------------------------------------------------------- HxROS Details Patient Name: Date of Service: [name], [place] 10/14/2021 10:30 A M Medical Record Number: [id number] Patient Account Number: [id number] Date of Birth/Sex: Treating RN: 01-29-1976 (46 y.o. M) Primary Care Provider: [name] Other Clinician: Referring Provider: Treating Provider/Extender: [name] in Treatment: 60 Information Obtained From Patient Constitutional Symptoms (General Health) Medical History: Past Medical History Notes: COVID PNA 07/22/2019-11/14/2019 VENT ECMO, foot drop left foot , Eyes Medical History: Negative for: Cataracts; Glaucoma; Optic Neuritis Ear/Nose/Mouth/Throat Medical History: Negative for: Chronic sinus problems/congestion; Middle ear problems Hematologic/Lymphatic Medical History: Negative for: Anemia; Hemophilia; Human Immunodeficiency Virus; Lymphedema; Sickle Cell Disease Respiratory Medical History: Positive for: Asthma Negative for: Aspiration; Chronic Obstructive Pulmonary Disease (COPD); Pneumothorax; Sleep Apnea; Tuberculosis Cardiovascular Medical History: Positive for: Angina - with COVID; Hypertension Negative for: Arrhythmia; Congestive Heart Failure; Coronary Artery Disease; Deep Vein Thrombosis; Hypotension; Myocardial Infarction; Peripheral Arterial Disease; Peripheral Venous Disease; Phlebitis; Vasculitis Gastrointestinal Medical History: Negative for: Cirrhosis ; Colitis; Crohns; Hepatitis A; Hepatitis B; Hepatitis C Endocrine Medical History: Negative for: Type I Diabetes; Type II Diabetes Genitourinary Medical History: Negative for: End Stage Renal Disease Past Medical History Notes: kidney stone Immunological Medical History: Negative for: Lupus Erythematosus; Raynauds; Scleroderma Integumentary (Skin) Medical History: Negative for: History of Burn Musculoskeletal Medical History: Negative for: Gout; Rheumatoid Arthritis; Osteoarthritis; Osteomyelitis Neurologic Medical History: Negative for: Dementia; Neuropathy; Quadriplegia; Paraplegia; Seizure Disorder Oncologic Medical History: Negative for: Received Chemotherapy; Received Radiation Psychiatric Medical History: Negative for: Anorexia/bulimia; Confinement Anxiety Past Medical History Notes: anxiety Immunizations Pneumococcal  Vaccine: Received Pneumococcal Vaccination: No Implantable Devices None Hospitalization / Surgery History Type of Hospitalization/Surgery COVID PNA 07/22/2019- 11/14/2019 03/27/2020 wound debridement/ skin graft Family and Social History Cancer: Yes - Maternal Grandparents; Diabetes: Yes - Father,Paternal Grandparents; Heart Disease: Yes - Maternal Grandparents; Hereditary Spherocytosis: No; Hypertension: Yes - Father,Paternal Grandparents; Kidney Disease: No; Lung Disease: Yes - Siblings; Seizures: No; Stroke: No; Thyroid Problems: No; Tuberculosis: No; Never smoker; Marital Status - Married; Alcohol Use: Never; Drug Use: No History; Caffeine Use: Daily - tea, soda; Financial Concerns: No; Food, Clothing or Shelter Needs: No; Support System Lacking: No; Transportation Concerns: No Electronic Signature(s) Signed: 10/15/2021 8:31:39 AM By: Fredirick Maudlin MD FACS Entered By: Fredirick Maudlin on 10/14/2021 12:47:49 -------------------------------------------------------------------------------- Total Contact Cast Details Patient Name: Date of Service: [name], [place] 10/14/2021 10:30 A M Medical Record Number: [id number] Patient Account Number: [id number] Date of Birth/Sex: Treating RN: [birth year] (46 y.o. [name] Primary Care Provider: [name] Other Clinician: Referring Provider: Treating Provider/Extender: [name] in Treatment: 57 T Contact Cast  Applied for Wound Assessment: otal Wound #2 Left Calcaneus Performed By: Physician Fredirick Maudlin, MD Post Procedure Diagnosis Same as Pre-procedure Electronic Signature(s) Signed: 10/15/2021 8:31:39 AM By: Fredirick Maudlin MD FACS Signed: 10/15/2021 1:38:56 PM By: Levan Hurst RN, BSN Entered By: Levan Hurst on 10/14/2021 11:42:20 -------------------------------------------------------------------------------- SuperBill Details Patient Name: Date of Service: [name], [place].  10/14/2021 Medical Record Number: [id number] Patient Account Number: [id number] Date of Birth/Sex: Treating RN: [birth year] (46 y.o. M) Primary Care Provider: [name] Other Clinician: Referring Provider: Treating Provider/Extender: [name] in Treatment: 82 Diagnosis Coding ICD-10 Codes Code Description [phone] Non-pressure chronic ulcer of other part of right foot with fat layer exposed L97.522 Non-pressure chronic ulcer of other part of left foot with fat layer exposed Facility Procedures CPT4 Code: 63016010 9 I Description: 9323 - DEBRIDE WOUND 1ST 20 SQ CM OR < CD-10 Diagnosis Description L97.512 Non-pressure chronic ulcer of other part of right foot with fat layer exposed L97.522 Non-pressure chronic ulcer of other part of left foot with fat layer exposed Modifier: 1 Quantity: Physician Procedures : CPT4 Code Description Modifier [id number] - WC PHYS DEBR WO ANESTH 20 SQ CM ICD-10 Diagnosis Description L97.512 Non-pressure chronic ulcer of other part of right foot with fat layer exposed L97.522 Non-pressure chronic ulcer of other part of left  foot with fat layer exposed Quantity: 1 Electronic Signature(s) Signed: 10/14/2021 12:50:54 PM By: Fredirick Maudlin MD FACS Entered By: Fredirick Maudlin on 10/14/2021 12:50:53

## 2021-10-16 IMAGING — DX DG CHEST 1V PORT
1 series · 1 of 1 positions shown · non-contrast
Comparison: Radiograph yesterday. CT 07/30/2019

CLINICAL DATA: Endotracheal tube. Chest tube. Cardiorespiratory
failure. On ECMO.

EXAM:
PORTABLE CHEST 1 VIEW

[chest ap]
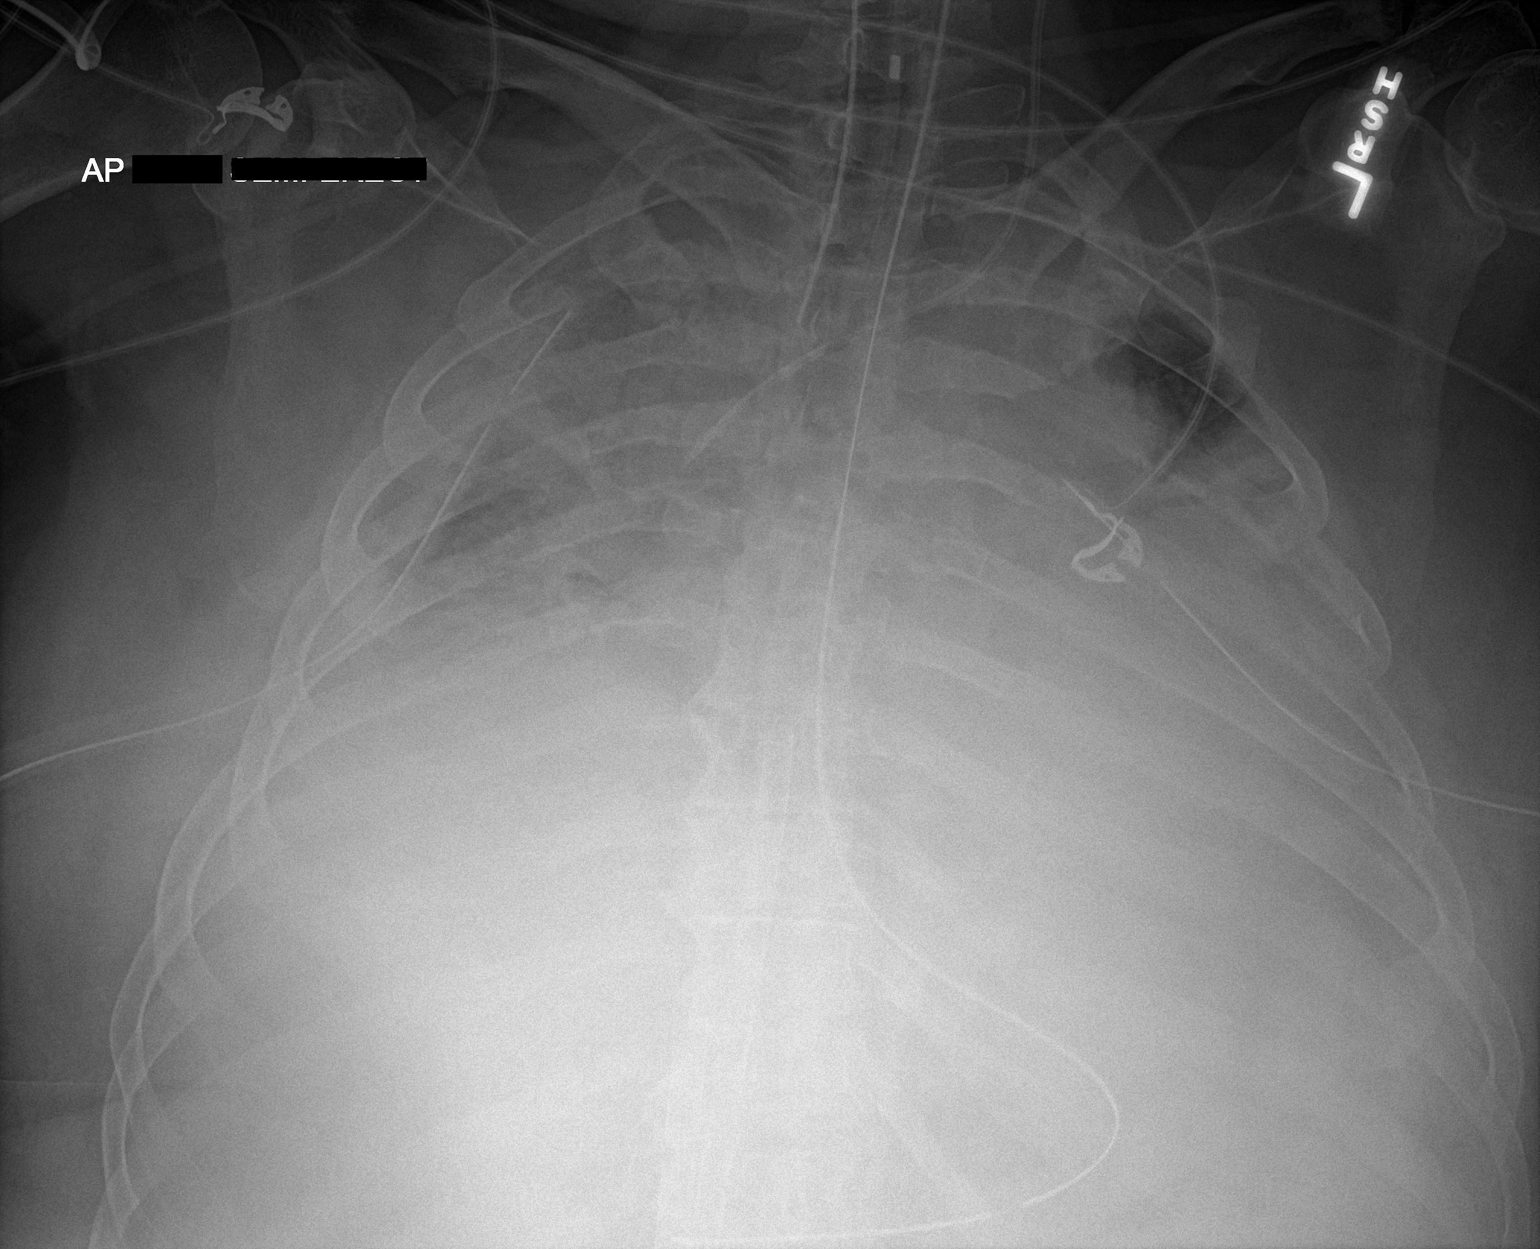

[1 of 1 positions shown; findings below may reference images not displayed]

FINDINGS: Endotracheal tube, enteric tube, left central line and bilateral
chest tubes remain in place. ECMO catheter faintly visualized
projecting over the atrial IVC junction. Additional ECMO catheter in
the upper IVC. Multiple additional overlying monitoring devices in
place. Progressive diffuse airspace opacities since yesterday.
Cardiomediastinal contours are obscured. No visualized pneumothorax.
IMPRESSION: 1. Progressive bilateral lung opacities since yesterday which may
represent pneumonia, ARDS, or combination thereof.
2. Support apparatus unchanged.

## 2021-10-17 IMAGING — DX DG ABD PORTABLE 1V
1 series · 1 of 1 positions shown · non-contrast
Comparison: 08/03/2019

CLINICAL DATA: Feeding tube placement

EXAM:
PORTABLE ABDOMEN - 1 VIEW

[abdomen]
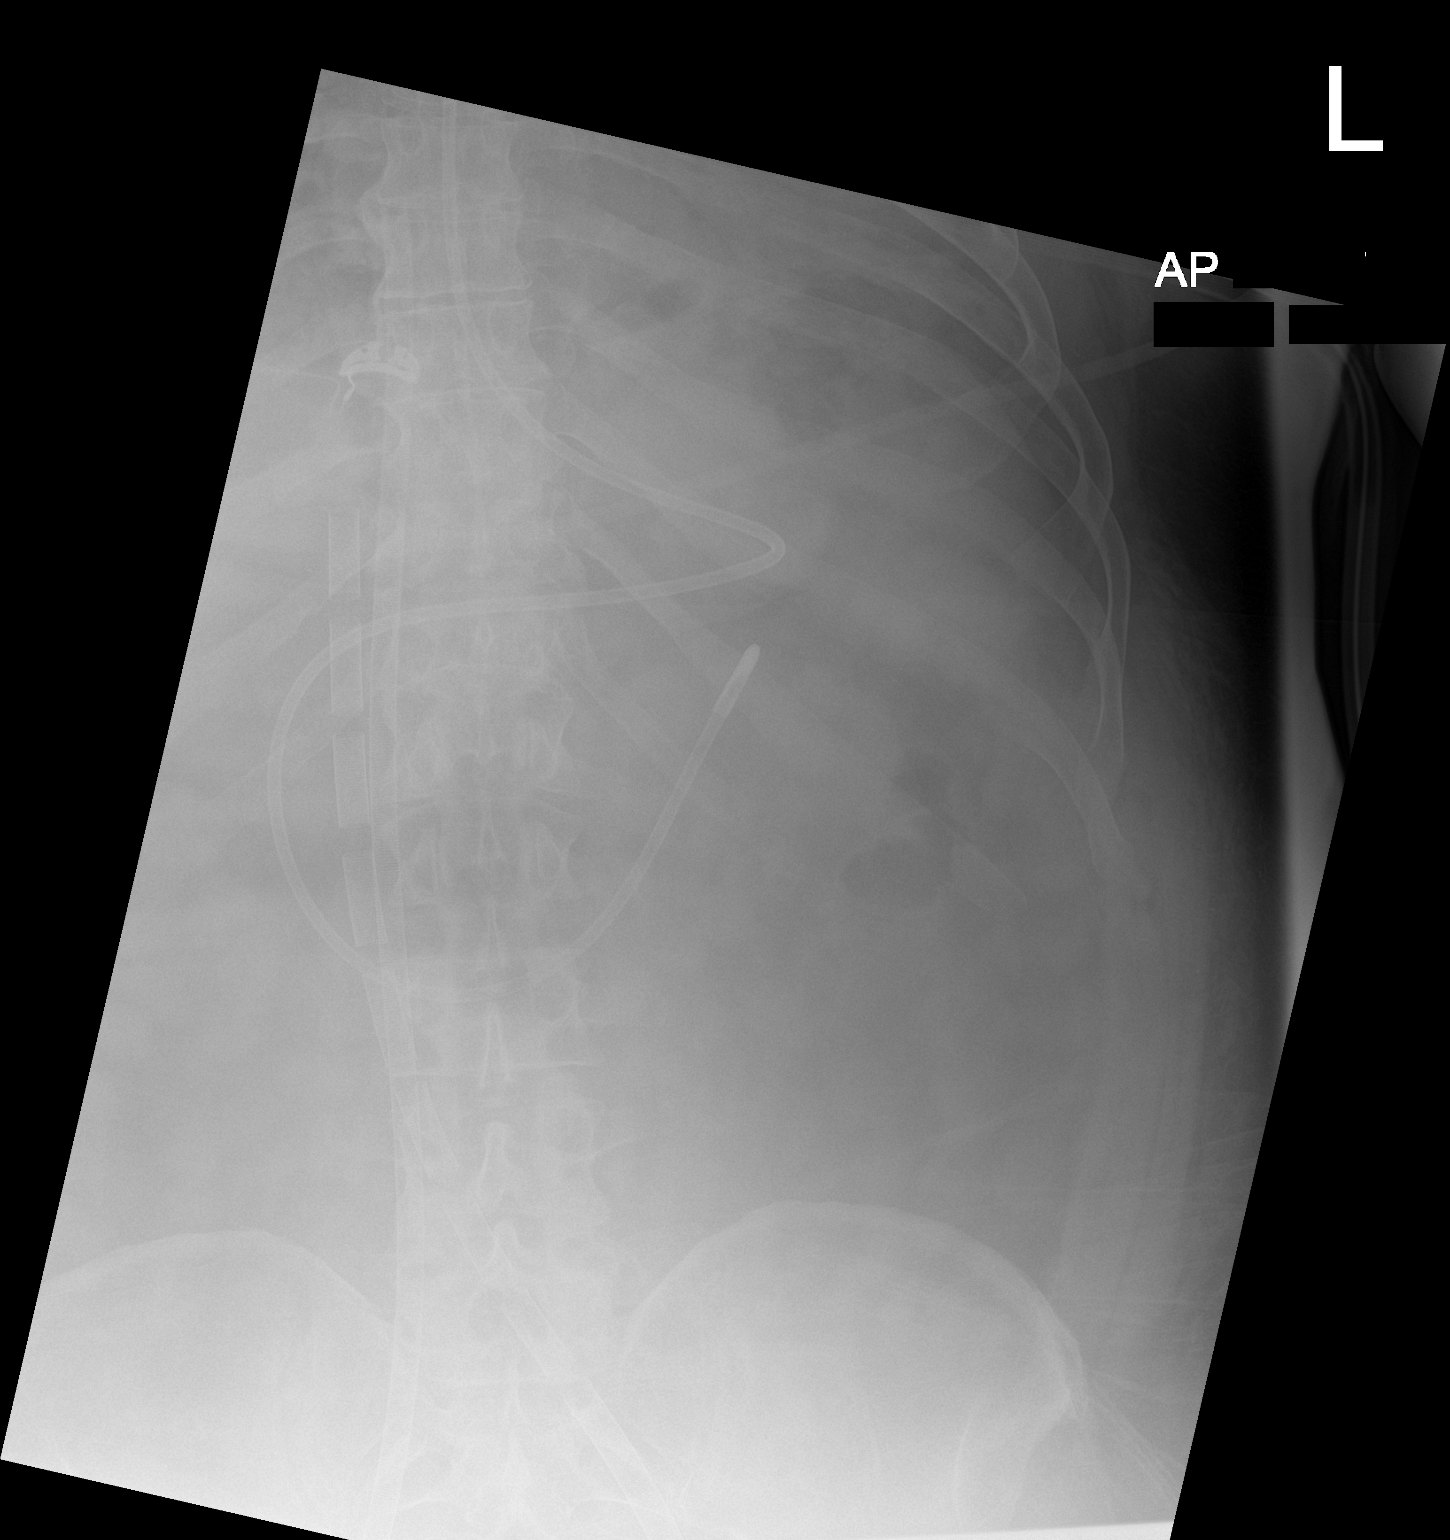

[1 of 1 positions shown; findings below may reference images not displayed]

FINDINGS: Feeding tube is in place with the tip in the distal duodenum near
the ligament of Treitz. Nonobstructive bowel gas pattern.
IMPRESSION: Feeding tube tip in the distal duodenum.

## 2021-10-17 IMAGING — DX DG CHEST 1V
1 series · 1 of 1 positions shown · non-contrast
Comparison: Radiograph yesterday.

CLINICAL DATA: Chest tube in place. Tracheostomy.

EXAM:
CHEST  1 VIEW

[chest ap]
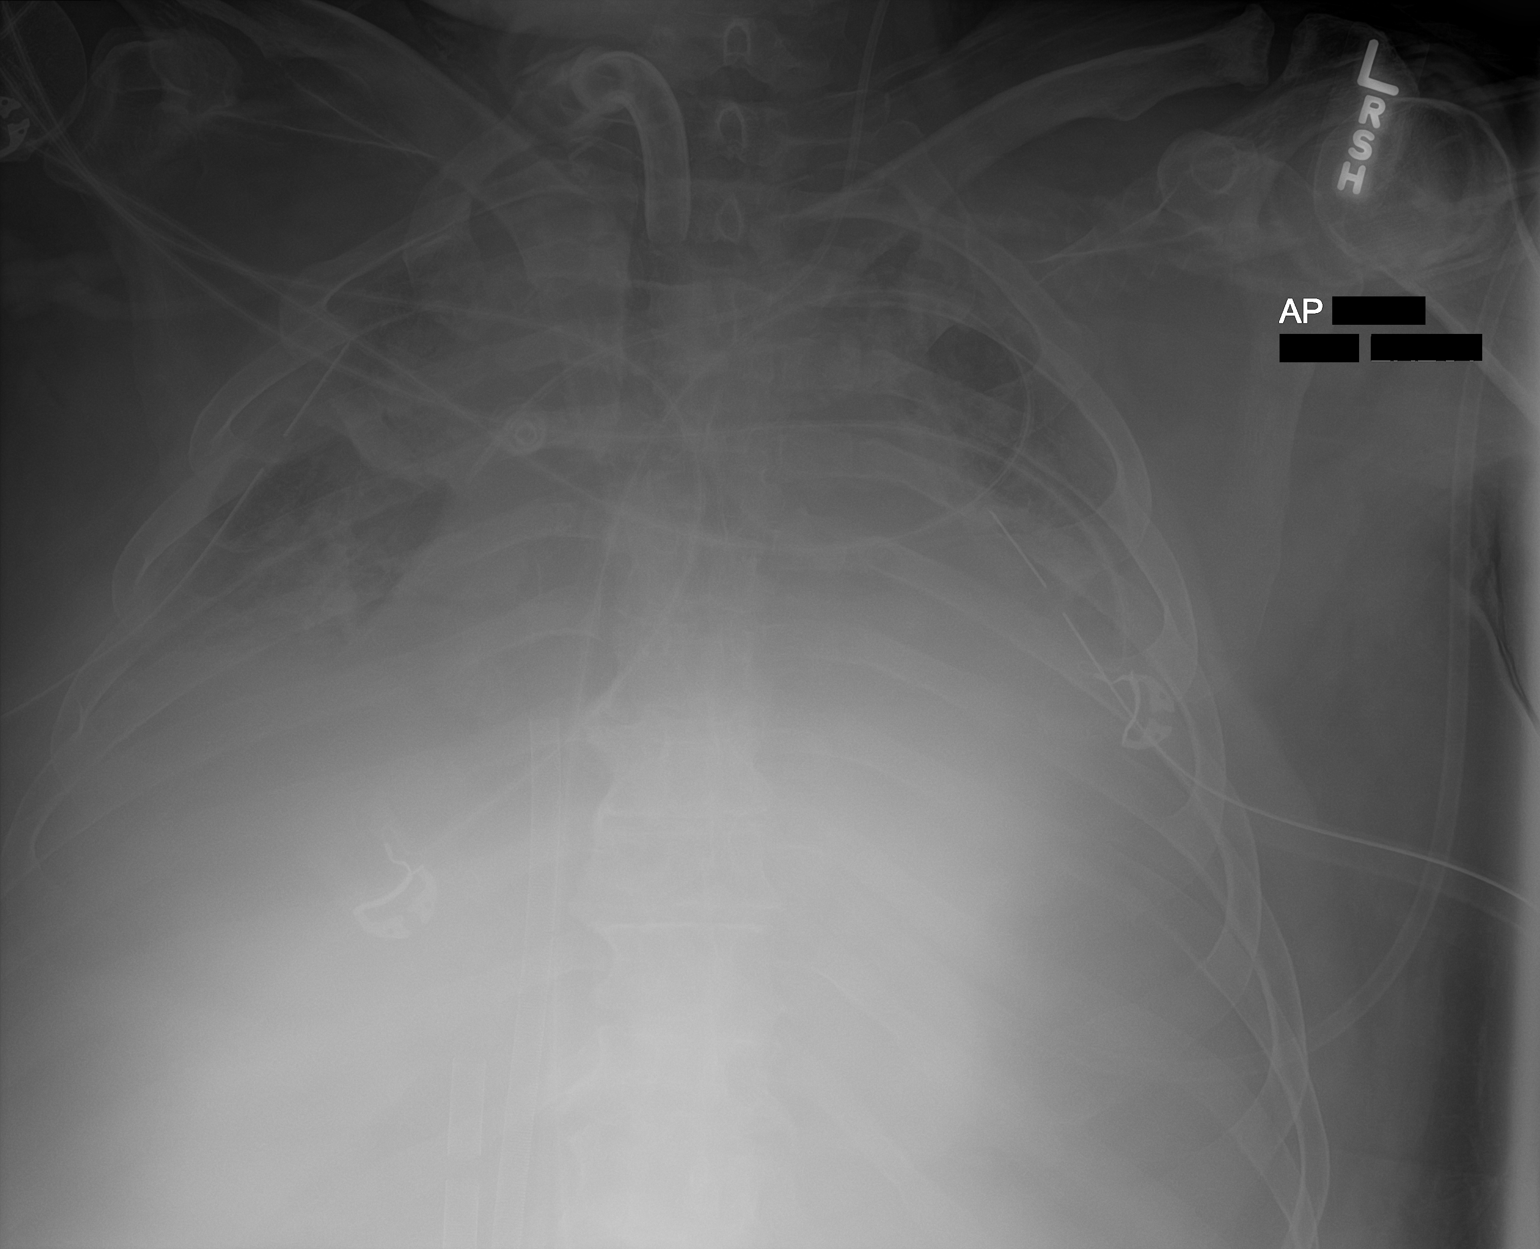

[1 of 1 positions shown; findings below may reference images not displayed]

FINDINGS: Tracheostomy tube tip at the thoracic inlet. Bilateral chest tubes
in place. Left internal jugular central line tip in the upper SVC.
ECMO catheter is noted in the IVC and atrial caval junction. No
evidence of pneumothorax. Cardiomegaly. Low lung volumes. Diffuse
bilateral airspace disease with slight worsening in the interim.
IMPRESSION: 1. Tracheostomy tube tip at the thoracic inlet.
2. Bilateral chest tubes in place. No pneumothorax.
3. Extensive bilateral lung opacification, slight progression from
yesterday.

## 2021-10-18 IMAGING — DX DG CHEST 1V PORT
1 series · 1 of 1 positions shown · non-contrast
Comparison: 08/07/2019

CLINICAL DATA: ET tubes present, patient on ECMO with history of
COVID

EXAM:
PORTABLE CHEST 1 VIEW

[chest]
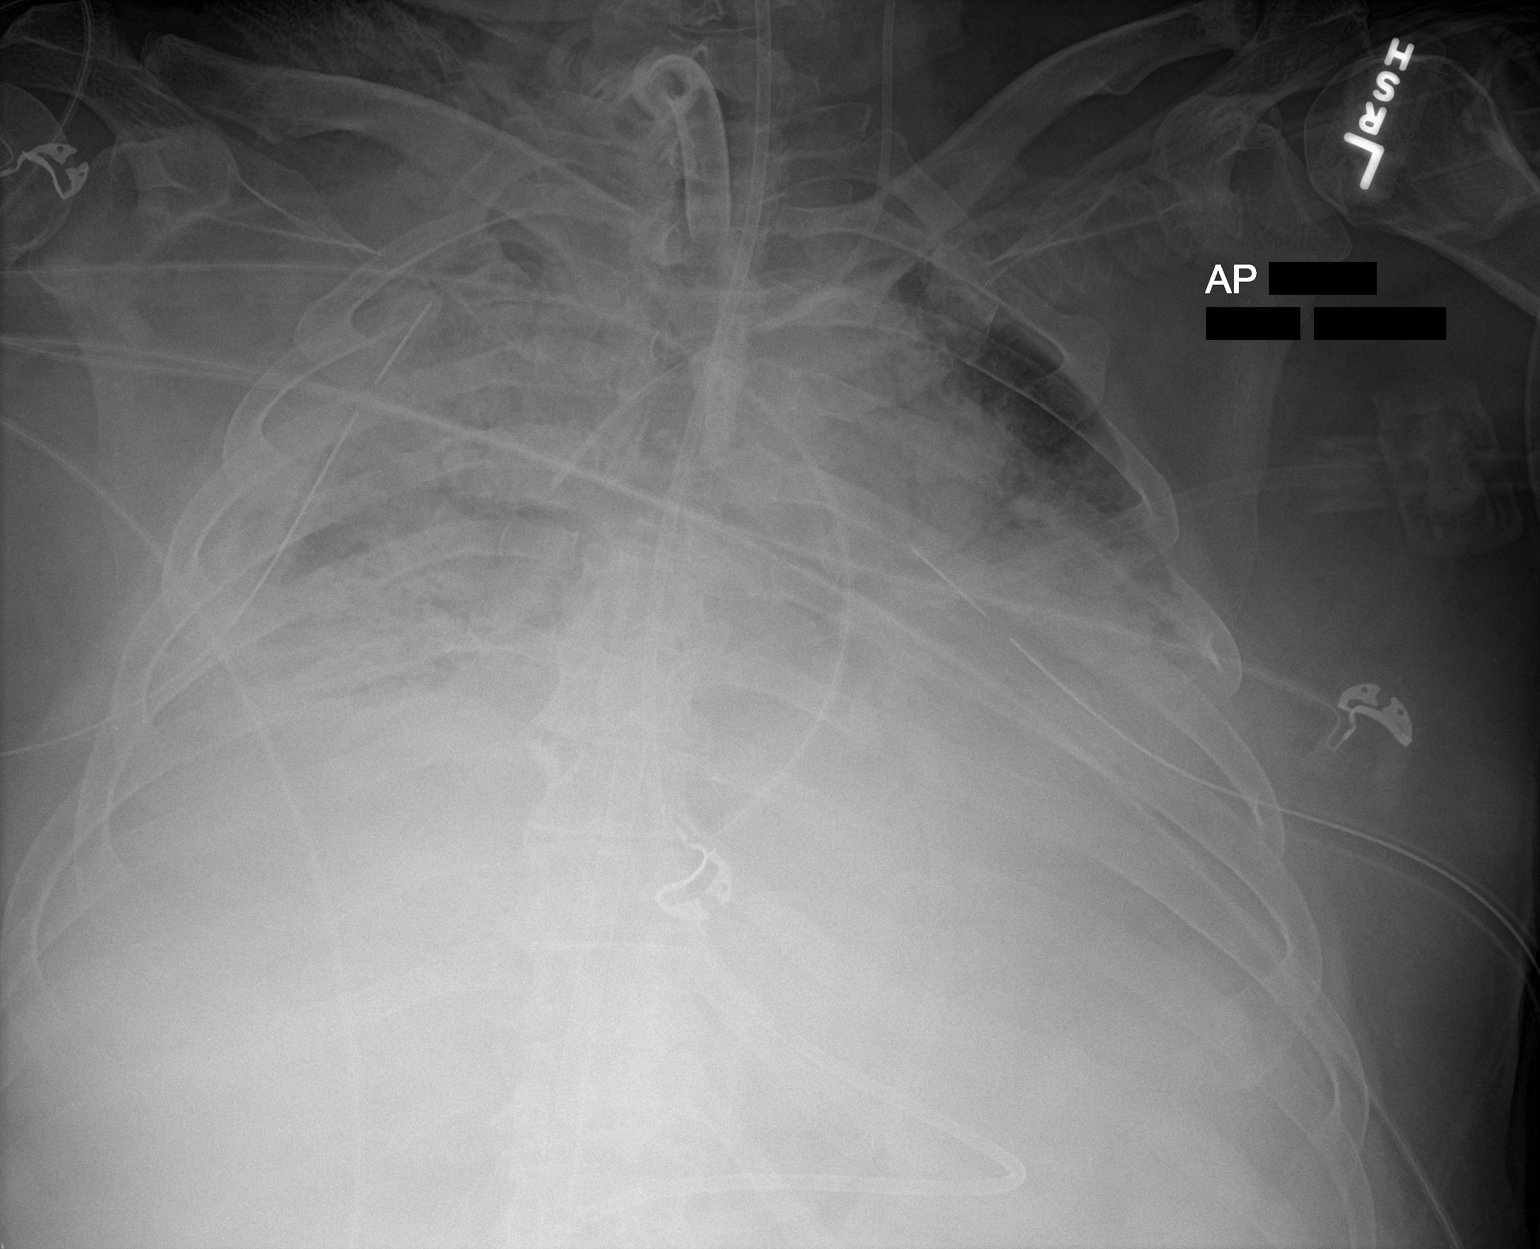

[1 of 1 positions shown; findings below may reference images not displayed]

FINDINGS: Tracheostomy tube, left-sided central venous access device and
bilateral chest tubes remain in place without change. Feeding tube
courses through and off of the field of the radiograph.

Cardiomediastinal contours largely obscured due to diffuse airspace
disease in the chest.

Dense opacification at the lung bases such that the left
hemidiaphragm is completely obscured with partial obscure a shin of
right hemidiaphragm.

No visible pneumothorax.

Visualized skeletal structures are unremarkable.
IMPRESSION: 1. No significant change in the appearance of the chest since
yesterday's study.
2. Stable support apparatus.
3. No visible pneumothorax.

## 2021-10-19 ENCOUNTER — Ambulatory Visit: Payer: BC Managed Care – PPO | Admitting: Behavioral Health

## 2021-10-19 DIAGNOSIS — S91309A Unspecified open wound, unspecified foot, initial encounter: Secondary | ICD-10-CM | POA: Diagnosis not present

## 2021-10-19 IMAGING — DX DG CHEST 1V PORT
1 series · 1 of 1 positions shown · non-contrast
Comparison: Radiograph yesterday.

CLINICAL DATA: Complications of chest tube.

EXAM:
PORTABLE CHEST 1 VIEW

[chest ap]
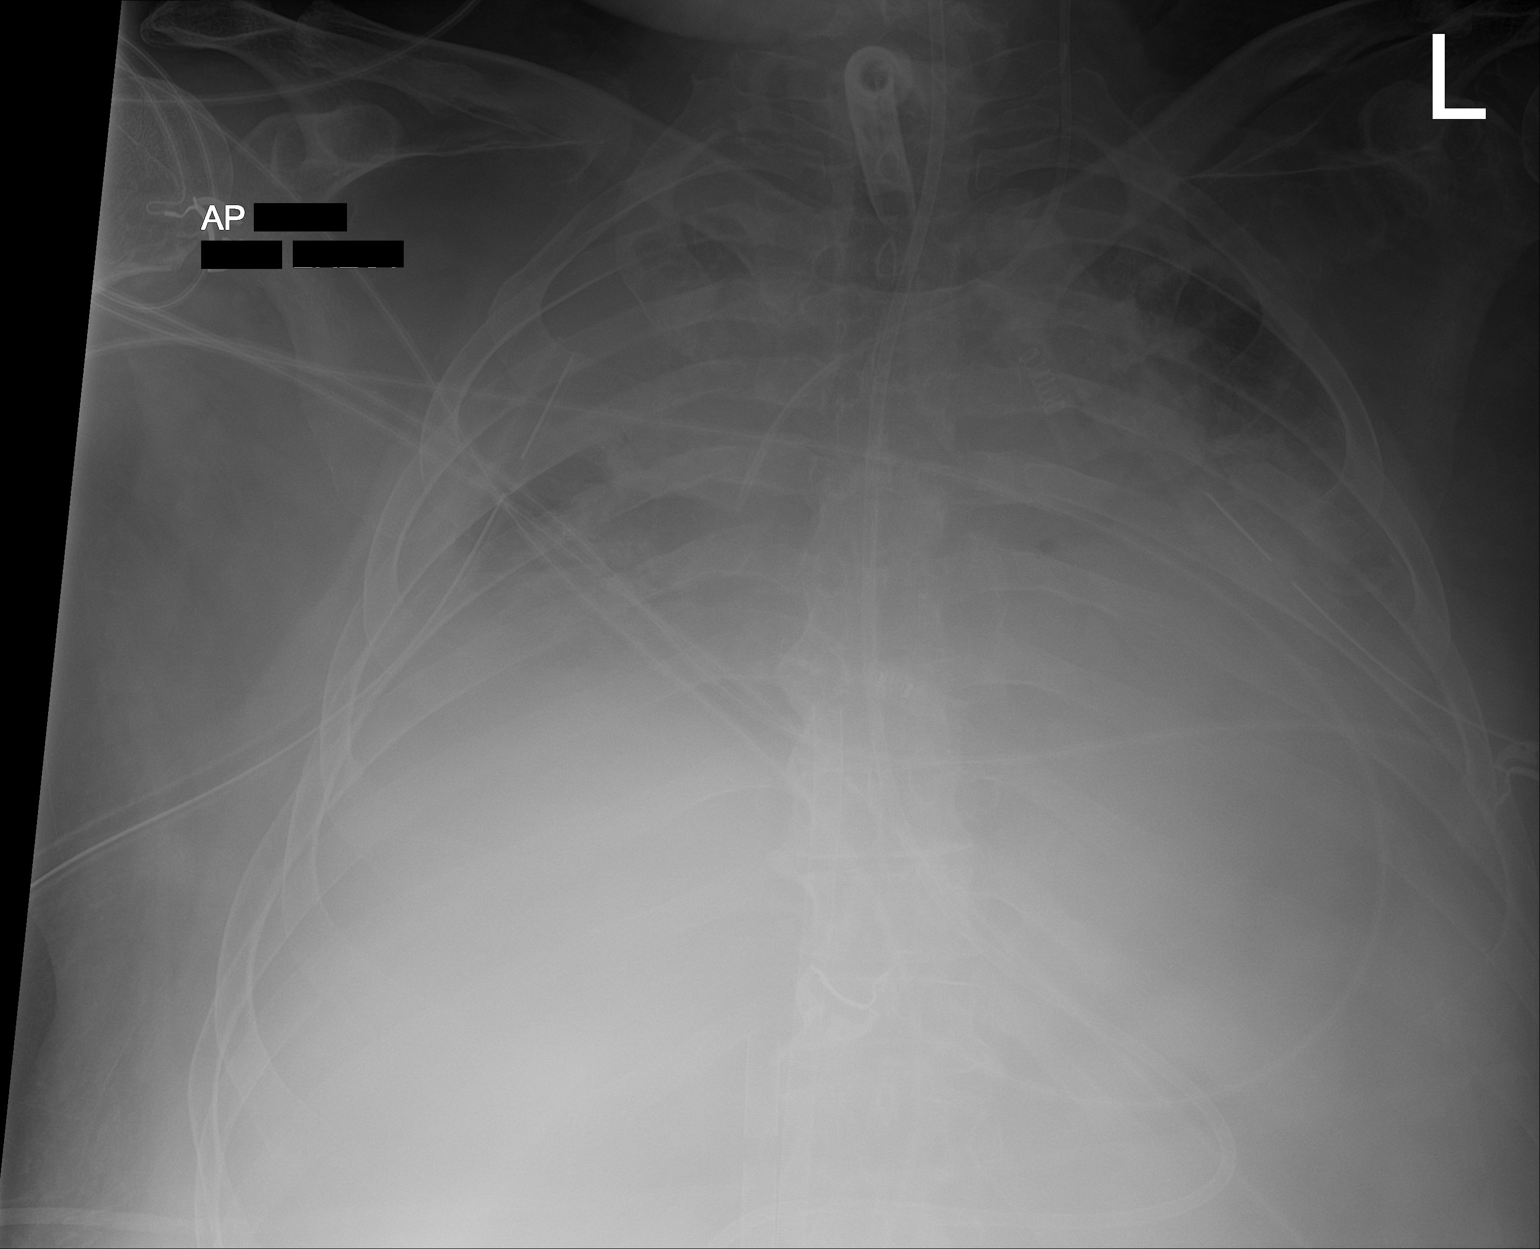

[1 of 1 positions shown; findings below may reference images not displayed]

FINDINGS: Tracheostomy tube tip at the thoracic inlet. Enteric tube tip below
the diaphragm not included in the field of view. Left central line
remains in place. Bilateral ECMO catheters from an inferior
approach. Bilateral chest tubes. Progressive opacification of the
right hemithorax may represent pleural effusions, with confluent
apical on right midlung density in addition to basilar opacity.
Underlying diffuse heterogeneous airspace disease. No obvious
pneumothorax.
IMPRESSION: 1. Progressive opacification of the right hemithorax may represent
partially loculated pleural fluid versus progressive airspace
disease.
2. Otherwise diffuse bilateral opacification of both hemi thoraces,
not significantly changed.
3. Bilateral chest tubes in place. No obvious pneumothorax. Support
apparatus unchanged.

## 2021-10-20 IMAGING — DX DG CHEST 1V PORT
2 series · 2 of 2 positions shown · non-contrast
Comparison: 08/10/2019, [DATE] a.m.

CLINICAL DATA: Central line placement

EXAM:
PORTABLE CHEST 1 VIEW

[chest ap (1 of 2)]
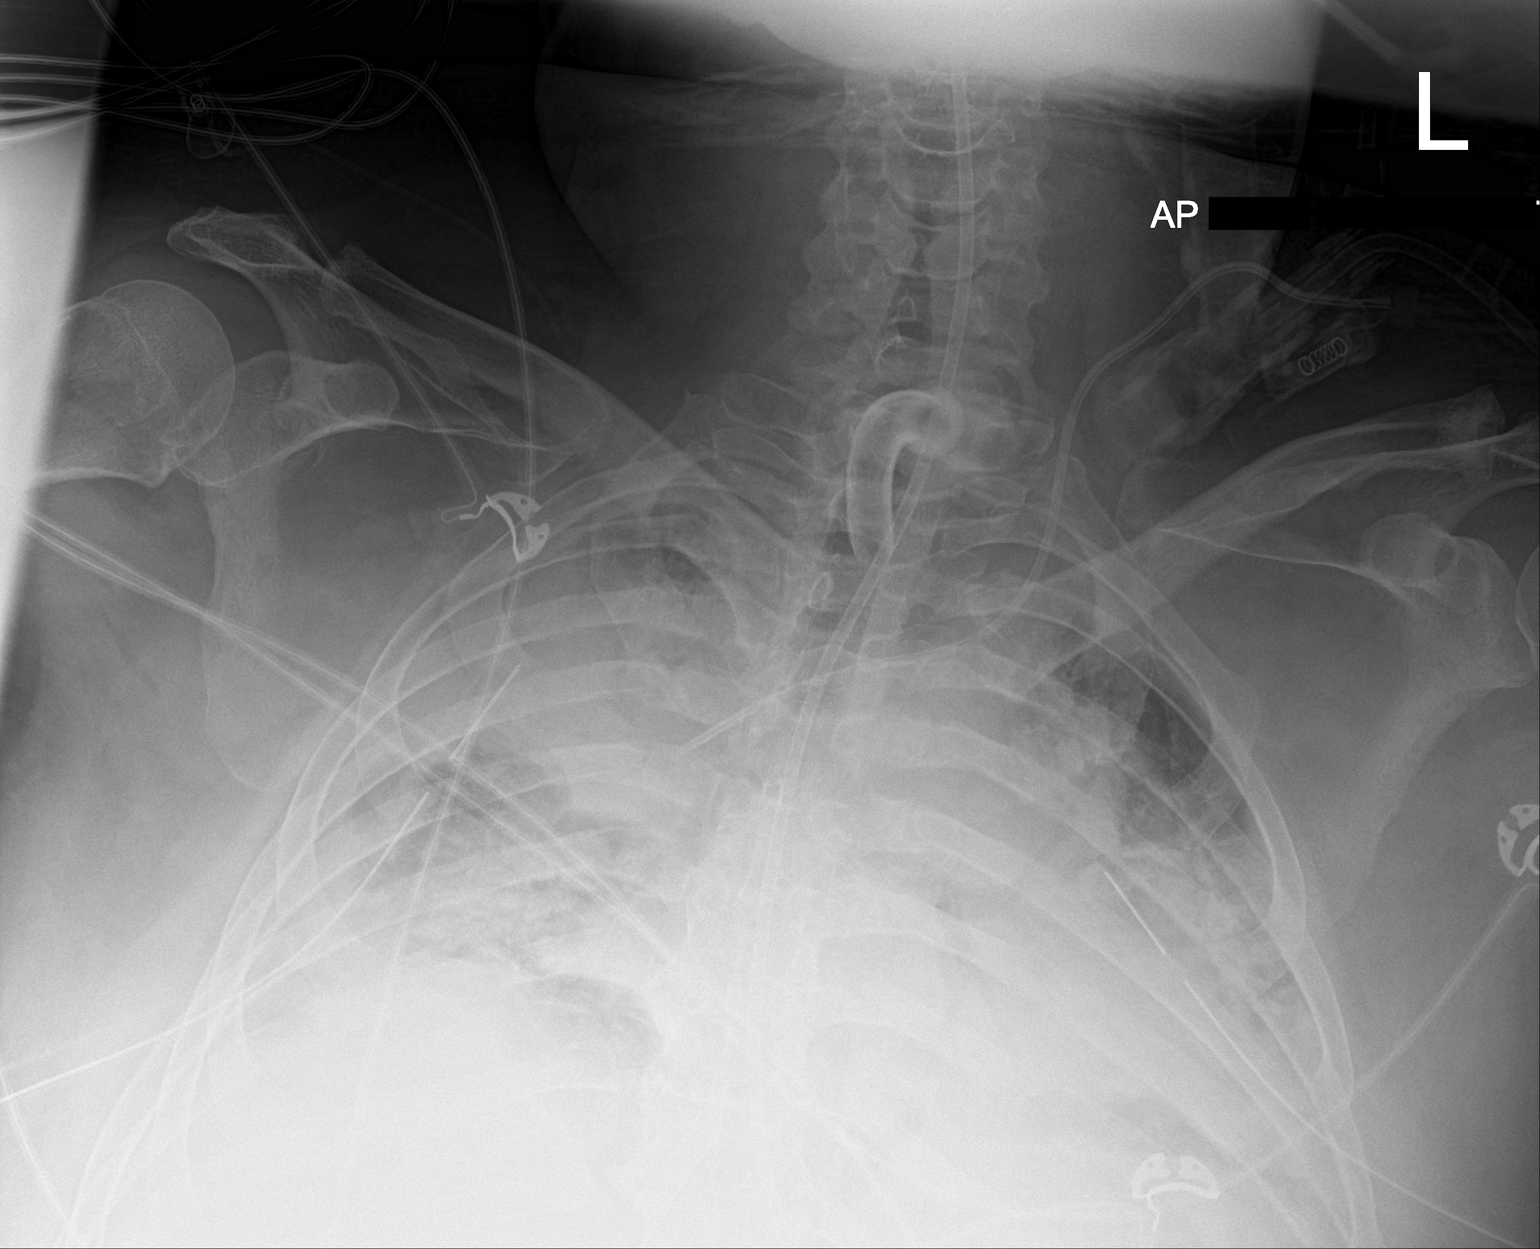

[chest ap (2 of 2)]
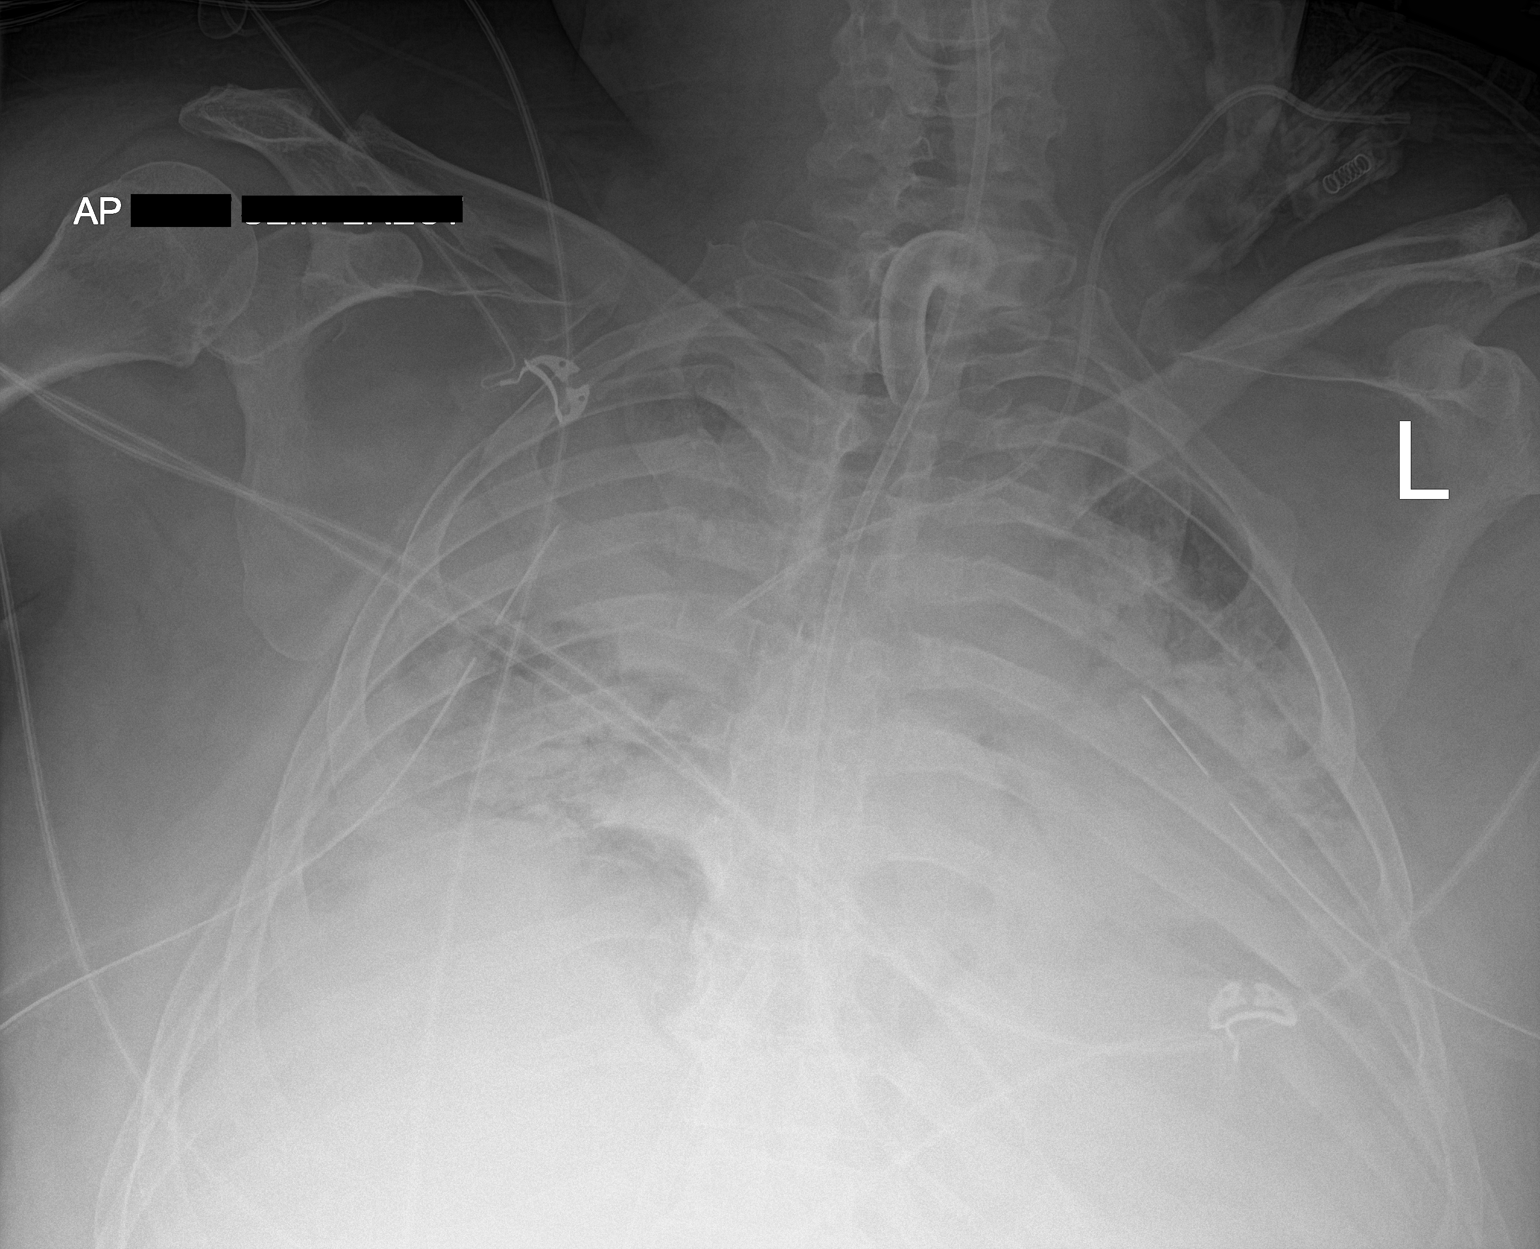

[2 of 2 positions shown; findings below may reference images not displayed]

FINDINGS: No change in appearance or position of a left neck vascular
catheter, tip positioned near the confluence of the superior vena
cava. Redemonstrated inferior approach ECMO cannula, tip projecting
in the expected vicinity of the right atrium. Support apparatus
including tracheostomy and bilateral chest tubes is otherwise
unchanged. There may be slight interval improvement in aeration of
the bilateral lungs, with generally very dense, extensive bilateral
consolidation. The cardiac borders are obscured.
IMPRESSION: 1. No change in appearance or position of a left neck vascular
catheter, tip positioned near the confluence of the superior vena
cava.
2. Redemonstrated inferior approach ECMO cannula, tip projecting in
the expected vicinity of the right atrium.
3. There may be slight interval improvement in aeration of the
bilateral lungs, with generally very dense, extensive bilateral
consolidation.

## 2021-10-20 IMAGING — DX DG CHEST 1V PORT
1 series · 1 of 1 positions shown · non-contrast
Comparison: August 09, 2019.

CLINICAL DATA: Acute respiratory failure with hypoxia.

EXAM:
PORTABLE CHEST 1 VIEW

[chest ap]
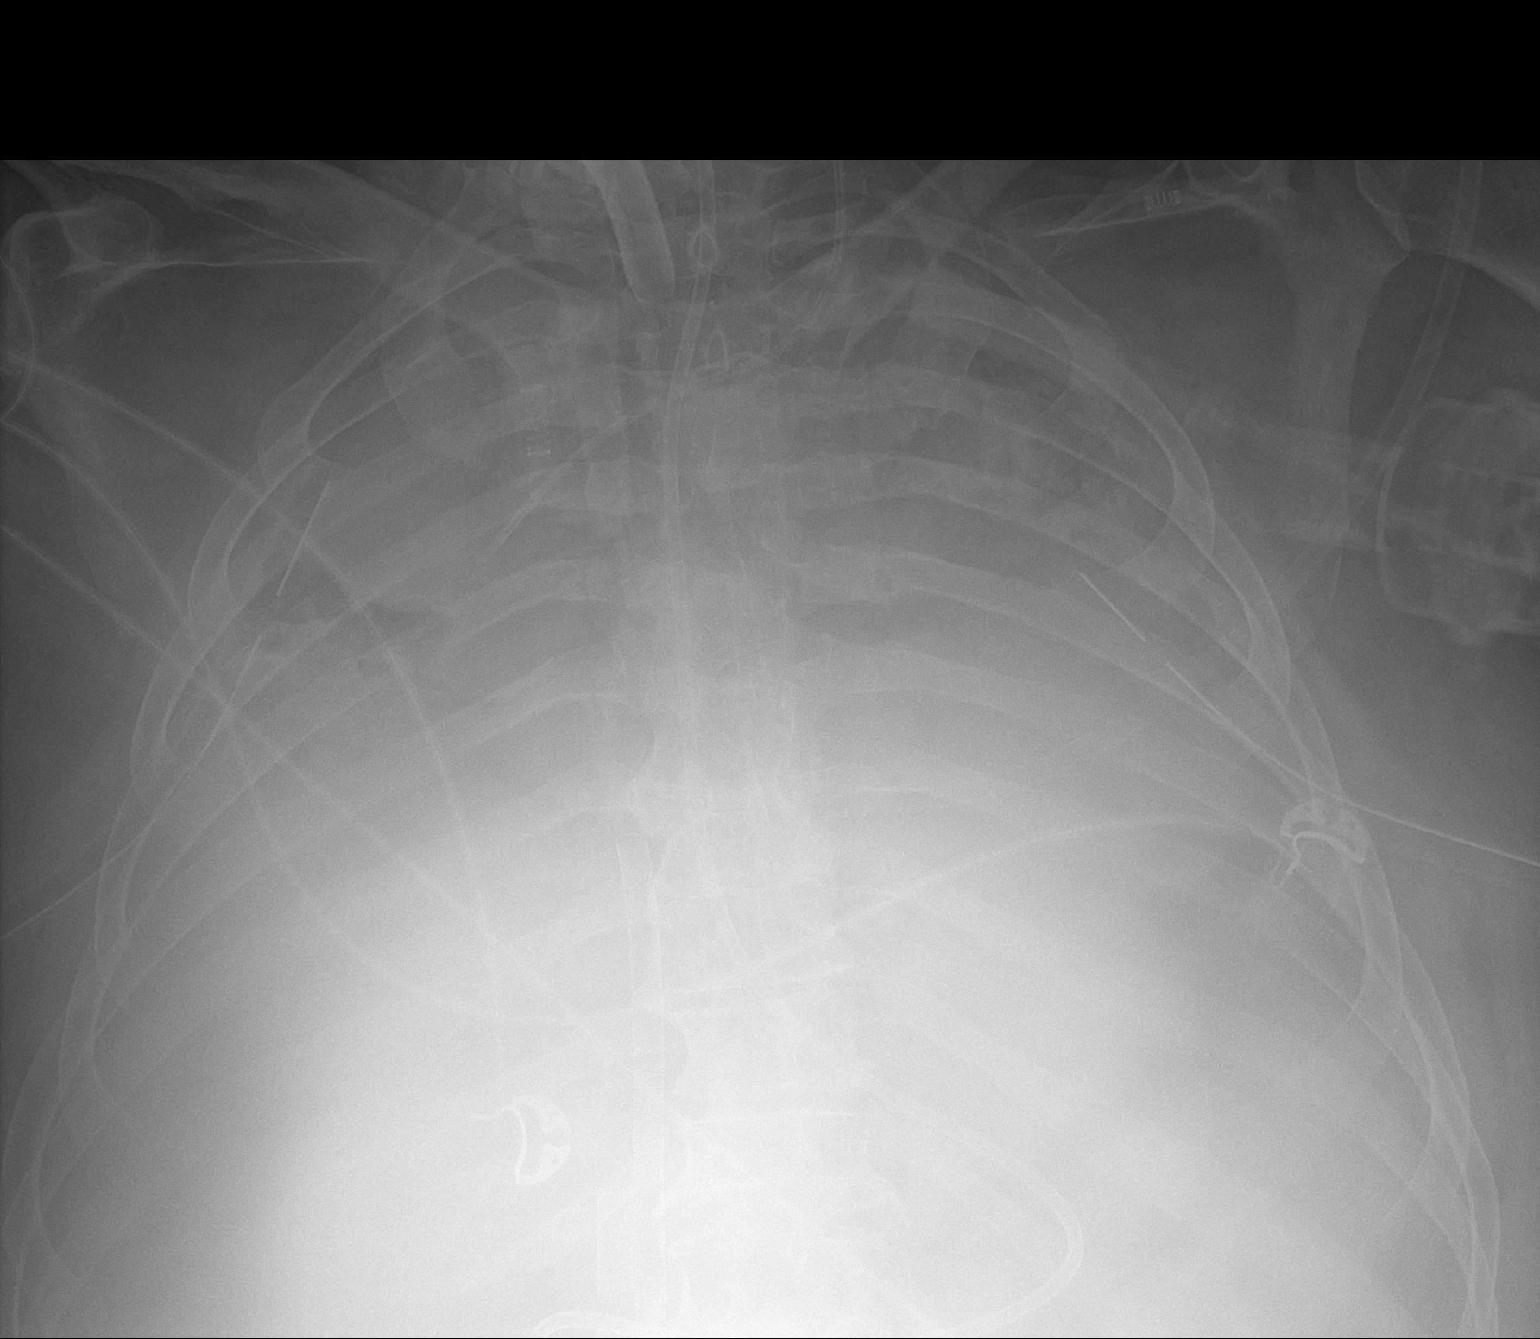

[1 of 1 positions shown; findings below may reference images not displayed]

FINDINGS: Endotracheal and feeding tubes are unchanged in position. Left
internal jugular catheter is unchanged. Bilateral chest tubes are
noted without pneumothorax. There remains diffuse opacification of
both hemi thoraces consistent with pneumonia or atelectasis and
associated effusions. Bony thorax is unremarkable.
IMPRESSION: Stable support apparatus. Stable bilateral chest tubes without
pneumothorax. Stable bilateral lung opacities are noted concerning
for pneumonia or atelectasis with associated pleural effusions.

## 2021-10-21 ENCOUNTER — Other Ambulatory Visit: Payer: Self-pay

## 2021-10-21 ENCOUNTER — Encounter (HOSPITAL_BASED_OUTPATIENT_CLINIC_OR_DEPARTMENT_OTHER): Payer: BC Managed Care – PPO | Admitting: General Surgery

## 2021-10-21 DIAGNOSIS — Z8616 Personal history of COVID-19: Secondary | ICD-10-CM | POA: Diagnosis not present

## 2021-10-21 DIAGNOSIS — I10 Essential (primary) hypertension: Secondary | ICD-10-CM | POA: Diagnosis not present

## 2021-10-21 DIAGNOSIS — L97512 Non-pressure chronic ulcer of other part of right foot with fat layer exposed: Secondary | ICD-10-CM | POA: Diagnosis not present

## 2021-10-21 DIAGNOSIS — L97522 Non-pressure chronic ulcer of other part of left foot with fat layer exposed: Secondary | ICD-10-CM | POA: Diagnosis not present

## 2021-10-21 NOTE — Progress Notes (Signed)
[name] ([id number]) ?Visit Report for 10/21/2021 ?Arrival Information Details ?Patient Name: Date of Service: ?[name], [name] 10/21/2021 9:30 A M ?Medical Record Number: [id number] ?Patient Account Number: [id number] ?Date of Birth/Sex: Treating RN: ?April 05, 1976 (46 y.o. [name]) [name] ?Primary Care Nobel Brar: [name] Other Clinician: ?Referring Kalenna Millett: ?Treating Laylanie Kruczek/Extender: Fredirick Maudlin ?[name] ?Weeks in Treatment: 45 ?Visit Information History Since Last Visit ?Added or deleted any medications: No ?Patient Arrived: Wheel Chair ?Any new allergies or adverse reactions: No ?Arrival Time: 09:37 ?Had a fall or experienced change in No ?Accompanied By: spouse ?activities of daily living that may affect ?Transfer Assistance: None ?risk of falls: ?Patient Requires Transmission-Based Precautions: No ?Signs or symptoms of abuse/neglect since last visito No ?Patient Has Alerts: No ?Hospitalized since last visit: No ?Implantable device outside of the clinic excluding No ?cellular tissue based products placed in the center ?since last visit: ?Has Dressing in Place as Prescribed: Yes ?Has Compression in Place as Prescribed: Yes ?Pain Present Now: Yes ?Electronic Signature(s) ?Signed: 10/21/2021 5:46:16 PM By: [name] ?Entered By: [name] on 10/21/2021 09:59:18 ?-------------------------------------------------------------------------------- ?Encounter Discharge Information Details ?Patient Name: Date of Service: ?[name], [name] 10/21/2021 9:30 A M ?Medical Record Number: [id number] ?Patient Account Number: [id number] ?Date of Birth/Sex: Treating RN: ?15-Jul-1976 (46 y.o. [name]) [name] ?Primary Care Avaiyah Strubel: [name] Other Clinician: ?Referring Lasaro Primm: ?Treating Stephany Poorman/Extender: Fredirick Maudlin ?[name] ?Weeks in Treatment: 37 ?Encounter Discharge Information Items Post Procedure Vitals ?Discharge Condition: Stable ?Temperature (F): 98.5 ?Ambulatory Status:  Wheelchair ?Pulse (bpm): 81 ?Discharge Destination: Home ?Respiratory Rate (breaths/min): 16 ?Transportation: Private Auto ?Blood Pressure (mmHg): 131/82 ?Accompanied By: spouse ?Schedule Follow-up Appointment: Yes ?Clinical Summary of Care: Patient Declined ?Electronic Signature(s) ?Signed: 10/21/2021 5:46:16 PM By: [name] ?Entered By: [name] on 10/21/2021 17:45:51 ?-------------------------------------------------------------------------------- ?Lower Extremity Assessment Details ?Patient Name: ?Date of Service: ?[name], [name] 10/21/2021 9:30 A M ?Medical Record Number: [id number] ?Patient Account Number: [id number] ?Date of Birth/Sex: ?Treating RN: ?Jun 12, 1976 (46 y.o. [name]) [name] ?Primary Care Deshawn Skelley: [name] ?Other Clinician: ?Referring Jevaun Strick: ?Treating Hadlei Stitt/Extender: Fredirick Maudlin ?[name] ?Weeks in Treatment: 7 ?Edema Assessment ?Assessed: [Left: No] [Right: No] ?Edema: [Left: No] [Right: No] ?Calf ?Left: Right: ?Point of Measurement: 29 cm From Medial Instep 42.1 cm 43 cm ?Ankle ?Left: Right: ?Point of Measurement: 9 cm From Medial Instep 24.2 cm 25 cm ?Electronic Signature(s) ?Signed: 10/21/2021 5:46:16 PM By: [name] ?Entered By: [name] on 10/21/2021 10:02:26 ?-------------------------------------------------------------------------------- ?Multi Wound Chart Details ?Patient Name: ?Date of Service: ?[name], [name] 10/21/2021 9:30 A M ?Medical Record Number: [id number] ?Patient Account Number: [id number] ?Date of Birth/Sex: ?Treating RN: ?September 25, 1975 (46 y.o. M) ?Primary Care Reia Viernes: [name] ?Other Clinician: ?Referring Nasier Thumm: ?Treating Rossetta Kama/Extender: Fredirick Maudlin ?[name] ?Weeks in Treatment: 86 ?Vital Signs ?Height(in): 69 ?Pulse(bpm): 81 ?Weight(lbs): 280 ?Blood Pressure(mmHg): 131/82 ?Body Mass Index(BMI): 41.3 ?Temperature(??F): 98.5 ?Respiratory Rate(breaths/min): 16 ?Photos: [N/A:N/A] ?Right Calcaneus Left  Calcaneus N/A ?Wound Location: ?Pressure Injury Pressure Injury N/A ?Wounding Event: ?Pressure Ulcer Pressure Ulcer N/A ?Primary Etiology: ?Asthma, Angina, Hypertension Asthma, Angina, Hypertension N/A ?Comorbid History: ?10/07/2019 10/07/2019 N/A ?Date Acquired: ?32 66 N/A ?Weeks of Treatment: ?Open Open N/A ?Wound Status: ?No No N/A ?Wound Recurrence: ?2.7x1.2x0.3 3x0.6x0.2 N/A ?Measurements L x W x D (cm) ?2.545 1.414 N/A ?A (cm?) : ?rea ?0.763 0.283 N/A ?Volume (cm?) : ?92.00% 94.80% N/A ?% Reduction in A rea: ?97.60% 99.00% N/A ?% Reduction in Volume: ?Category/Stage III Category/Stage III N/A ?Classification: ?  Medium Medium N/A ?Exudate A mount: ?Serosanguineous Serosanguineous N/A ?Exudate Type: ?red, brown red, brown N/A ?Exudate Color: ?Distinct, outline attached Distinct, outline attached N/A ?Wound Margin: ?Small (1-33%) Large (67-100%) N/A ?Granulation A mount: ?Pink Red N/A ?Granulation Quality: ?Large (67-100%) Small (1-33%) N/A ?Necrotic A mount: ?Fat Layer (Subcutaneous Tissue): Yes Fat Layer (Subcutaneous Tissue): Yes N/A ?Exposed Structures: ?Fascia: No ?Fascia: No ?Tendon: No ?Tendon: No ?Muscle: No ?Muscle: No ?Joint: No ?Joint: No ?Bone: No ?Bone: No ?Small (1-33%) None N/A ?Epithelialization: ?Debridement - Selective/Open Wound Debridement - Selective/Open Wound N/A ?Debridement: ?Pre-procedure Verification/Time Out 10:35 10:35 N/A ?Taken: ?Lidocaine 5% topical ointment Lidocaine 5% topical ointment N/A ?Pain Control: ?Callus, Slough Callus, Slough N/A ?Tissue Debrided: ?Non-Viable Tissue Non-Viable Tissue N/A ?Level: ?3.24 1.8 N/A ?Debridement A (sq cm): ?rea ?Curette Curette N/A ?Instrument: ?Minimum Minimum N/A ?Bleeding: ?Pressure Pressure N/A ?Hemostasis A chieved: ?0 0 N/A ?Procedural Pain: ?0 0 N/A ?Post Procedural Pain: ?Procedure was tolerated well Procedure was tolerated well N/A ?Debridement Treatment Response: ?2.7x1.2x0.3 3x0.6x0.2 N/A ?Post Debridement Measurements L x ?W x D  (cm) ?0.763 0.283 N/A ?Post Debridement Volume: (cm?) ?Category/Stage III Category/Stage III N/A ?Post Debridement Stage: ?Debridement Debridement N/A ?Procedures Performed: ?Treatment Notes ?Electronic Signature(s) ?Signed: 10/21/2021 10:40:47 AM By: Fredirick Maudlin MD FACS ?Entered By: Fredirick Maudlin on 10/21/2021 10:40:47 ?-------------------------------------------------------------------------------- ?Multi-Disciplinary Care Plan Details ?Patient Name: ?Date of Service: ?[name], [name] 10/21/2021 9:30 A M ?Medical Record Number: [id number] ?Patient Account Number: [id number] ?Date of Birth/Sex: ?Treating RN: ?10/10/1975 (46 y.o. [name]) [name] ?Primary Care Jerri Hargadon: [name] ?Other Clinician: ?Referring Dariusz Brase: ?Treating Ruthetta Koopmann/Extender: Fredirick Maudlin ?[name] ?Weeks in Treatment: 10 ?Multidisciplinary Care Plan reviewed with physician ?Active Inactive ?Wound/Skin Impairment ?Nursing Diagnoses: ?Impaired tissue integrity ?Knowledge deficit related to ulceration/compromised skin integrity ?Goals: ?Patient/caregiver will verbalize understanding of skin care regimen ?Date Initiated: 07/15/2020 ?Target Resolution Date: 11/12/2021 ?Goal Status: Active ?Ulcer/skin breakdown will have a volume reduction of 30% by week 4 ?Date Initiated: 07/15/2020 ?Date Inactivated: 08/20/2020 ?Target Resolution Date: 09/03/2020 ?Goal Status: Unmet ?Unmet Reason: no major changes. ?Ulcer/skin breakdown will heal within 14 weeks ?Date Initiated: 12/04/2020 ?Date Inactivated: 12/10/2020 ?Target Resolution Date: 12/10/2020 ?Unmet Reason: wounds still open at 14 ?Goal Status: Unmet ?weeks and today 21 weeks. ?Interventions: ?Assess patient/caregiver ability to obtain necessary supplies ?Assess patient/caregiver ability to perform ulcer/skin care regimen upon admission and as needed ?Assess ulceration(s) every visit ?Provide education on ulcer and skin care ?Treatment Activities: ?Skin care regimen initiated : 07/15/2020 ?Topical  wound management initiated : 07/15/2020 ?Notes: ?Electronic Signature(s) ?Signed: 10/21/2021 5:46:16 PM By: [name] ?Entered By: [name] on 10/21/2021 17:44:21 ?------------------------------------------

## 2021-10-21 NOTE — Progress Notes (Signed)
[name] ([id number]) ?Visit Report for 10/21/2021 ?Chief Complaint Document Details ?Patient Name: Date of Service: ?[name], [name] 10/21/2021 9:30 A M ?Medical Record Number: [id number] ?Patient Account Number: [id number] ?Date of Birth/Sex: Treating RN: ?1976/03/03 (46 y.o. M) ?Primary Care Provider: [name] Other Clinician: ?Referring Provider: ?Treating Provider/Extender: Fredirick Maudlin ?[name] ?Weeks in Treatment: 76 ?Information Obtained from: Patient ?Chief Complaint ?Bilateral Plantar Foot Ulcers ?Electronic Signature(s) ?Signed: 10/21/2021 10:41:00 AM By: Fredirick Maudlin MD FACS ?Entered By: Fredirick Maudlin on 10/21/2021 10:40:59 ?-------------------------------------------------------------------------------- ?Debridement Details ?Patient Name: Date of Service: ?[name], [name] 10/21/2021 9:30 A M ?Medical Record Number: [id number] ?Patient Account Number: [id number] ?Date of Birth/Sex: Treating RN: ?1975/08/25 (46 y.o. [name]) [name] ?Primary Care Provider: [name] Other Clinician: ?Referring Provider: ?Treating Provider/Extender: Fredirick Maudlin ?[name] ?Weeks in Treatment: 88 ?Debridement Performed for Assessment: Wound #1 Right Calcaneus ?Performed By: Physician Fredirick Maudlin, MD ?Debridement Type: Debridement ?Level of Consciousness (Pre-procedure): Awake and Alert ?Pre-procedure Verification/Time Out Yes - 10:35 ?Taken: ?Start Time: 10:35 ?Pain Control: Lidocaine 5% topical ointment ?T Area Debrided (L x W): ?otal 2.7 (cm) x 1.2 (cm) = 3.24 (cm?) ?Tissue and other material debrided: Non-Viable, Callus, Slough, [place] ?Level: Non-Viable Tissue ?Debridement Description: Selective/Open Wound ?Instrument: Curette ?Bleeding: Minimum ?Hemostasis Achieved: Pressure ?End Time: 10:37 ?Procedural Pain: 0 ?Post Procedural Pain: 0 ?Response to Treatment: Procedure was tolerated well ?Level of Consciousness (Post- Awake and Alert ?procedure): ?Post Debridement Measurements of  Total Wound ?Length: (cm) 2.7 ?Stage: Category/Stage III ?Width: (cm) 1.2 ?Depth: (cm) 0.3 ?Volume: (cm?) 0.763 ?Character of Wound/Ulcer Post Debridement: Improved ?Post Procedure Diagnosis ?Same as Pre-procedure ?Electronic Signature(s) ?Signed: 10/21/2021 4:59:32 PM By: Fredirick Maudlin MD FACS ?Signed: 10/21/2021 5:46:16 PM By: [name] ?Entered By: [name] on 10/21/2021 10:39:40 ?-------------------------------------------------------------------------------- ?Debridement Details ?Patient Name: ?Date of Service: ?[name], [name] 10/21/2021 9:30 A M ?Medical Record Number: [id number] ?Patient Account Number: [id number] ?Date of Birth/Sex: ?Treating RN: ?1976-04-06 (46 y.o. [name]) [name] ?Primary Care Provider: [name] ?Other Clinician: ?Referring Provider: ?Treating Provider/Extender: Fredirick Maudlin ?[name] ?Weeks in Treatment: 30 ?Debridement Performed for Assessment: Wound #2 Left Calcaneus ?Performed By: Physician Fredirick Maudlin, MD ?Debridement Type: Debridement ?Level of Consciousness (Pre-procedure): Awake and Alert ?Pre-procedure Verification/Time Out Yes - 10:35 ?Taken: ?Start Time: 10:35 ?Pain Control: Lidocaine 5% topical ointment ?T Area Debrided (L x W): ?otal 3 (cm) x 0.6 (cm) = 1.8 (cm?) ?Tissue and other material debrided: Non-Viable, Callus, Slough, [place] ?Level: Non-Viable Tissue ?Debridement Description: Selective/Open Wound ?Instrument: Curette ?Bleeding: Minimum ?Hemostasis Achieved: Pressure ?End Time: 10:37 ?Procedural Pain: 0 ?Post Procedural Pain: 0 ?Response to Treatment: Procedure was tolerated well ?Level of Consciousness (Post- Awake and Alert ?procedure): ?Post Debridement Measurements of Total Wound ?Length: (cm) 3 ?Stage: Category/Stage III ?Width: (cm) 0.6 ?Depth: (cm) 0.2 ?Volume: (cm?) 0.283 ?Character of Wound/Ulcer Post Debridement: Improved ?Post Procedure Diagnosis ?Same as Pre-procedure ?Electronic Signature(s) ?Signed: 10/21/2021 4:59:32 PM  By: Fredirick Maudlin MD FACS ?Signed: 10/21/2021 5:46:16 PM By: [name] ?Entered By: [name] on 10/21/2021 10:40:29 ?-------------------------------------------------------------------------------- ?HPI Details ?Patient Name: ?Date of Service: ?[name], [name] 10/21/2021 9:30 A M ?Medical Record Number: [id number] ?Patient Account Number: [id number] ?Date of Birth/Sex: ?Treating RN: ?10-22-1975 (46 y.o. M) ?Primary Care Provider: [name] Other Clinician: ?Referring Provider: ?Treating Provider/Extender: Fredirick Maudlin ?[name] ?Weeks in Treatment: 76 ?History of Present Illness ?HPI Description: Wounds are12/03/2020 upon evaluation today patient presents for initial inspection here in our clinic concerning  issues he has been having ?with the bottoms of his feet bilaterally. He states these actually occurred as wounds when he was hospitalized for 5 months secondary to Covid. He was ?apparently with tilting bed where he was in an upright position quite frequently and apparently this occurred in some way shape or form during that time. ?Fortunately there is no sign of active infection at this time. No fevers, chills, nausea, vomiting, or diarrhea. With that being said he still has substantial wounds ?on the plantar aspects of his feet Theragen require quite a bit of work to get these to heal. He has been using Santyl currently though that is been problematic ?both in receiving the medication as well as actually paid for it as it is become quite expensive. Prior to the experience with Covid the patient really did not have ?any major medical problems other than hypertension he does have some mild generalized weakness following the Covid experience. ?07/22/2020 on evaluation today patient appears to be doing okay in regard to his foot ulcers I feel like the wound beds are showing signs of better ?improvement that I do believe the Iodoflex is helping in this regard. With that being said he does  have a lot of drainage currently and this is somewhat ?blue/green in nature which is consistent with Pseudomonas. I do think a culture today would be appropriate for [place] to evaluate and see if that is indeed the case ?I would likely start him on antibiotic orally as well he is not allergic to Cipro knows of no issues he has had in the past ?12/21; patient was admitted to the clinic earlier this month with bilateral presumed pressure ulcers on the bottom of his feet apparently related to excessive ?pressure from a tilt table arrangement in the intensive care unit. Patient relates this to being on ECMO but I am not really sure that is exactly related to that. I ?must say I have never seen anything like this. He has fairly extensive full-thickness wounds extending from his heel towards his midfoot mostly centered ?laterally. There is already been some healing distally. He does not appear to have an arterial issue. He has been using gentamicin to the wound surfaces with ?Iodoflex to help with ongoing debridement ?1/6; this is a patient with pressure ulcers on the bottom of his feet related to excessive pressure from a standing position in the intensive care unit. He is ?complaining of a lot of pain in the right heel. He is not a diabetic. 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 We have been using ?Iodoflex to help prepare the surfaces of both wounds for an advanced treatment product. He is nonambulatory spending most of his time in a wheelchair I ?have asked him not to propel the wheelchair with his heels ?1/13; in general his wounds look better not much surface area change we have been using Iodoflex as of last week. ?I did an x-ray of the right heel as the patient was complaining of pain. I had some thoughts about a stress fracture perhaps Achilles tendon problems however ?what it showed was erosive changes along the inferior aspect of the calcaneus he now has a MRI booked for 1/20. ?1/20; in  general his wounds continue to be better. Some improvement in the large narrow areas proximally in his foot. He is still complaining of pain in the right ?heel and tenderness in certain areas of this wou

## 2021-10-22 IMAGING — DX DG CHEST 1V PORT
1 series · 1 of 1 positions shown · non-contrast
Comparison: 08/10/2019 and CT chest 07/30/2019.

CLINICAL DATA: COVID positive, ECMO.

EXAM:
PORTABLE CHEST 1 VIEW

[chest]
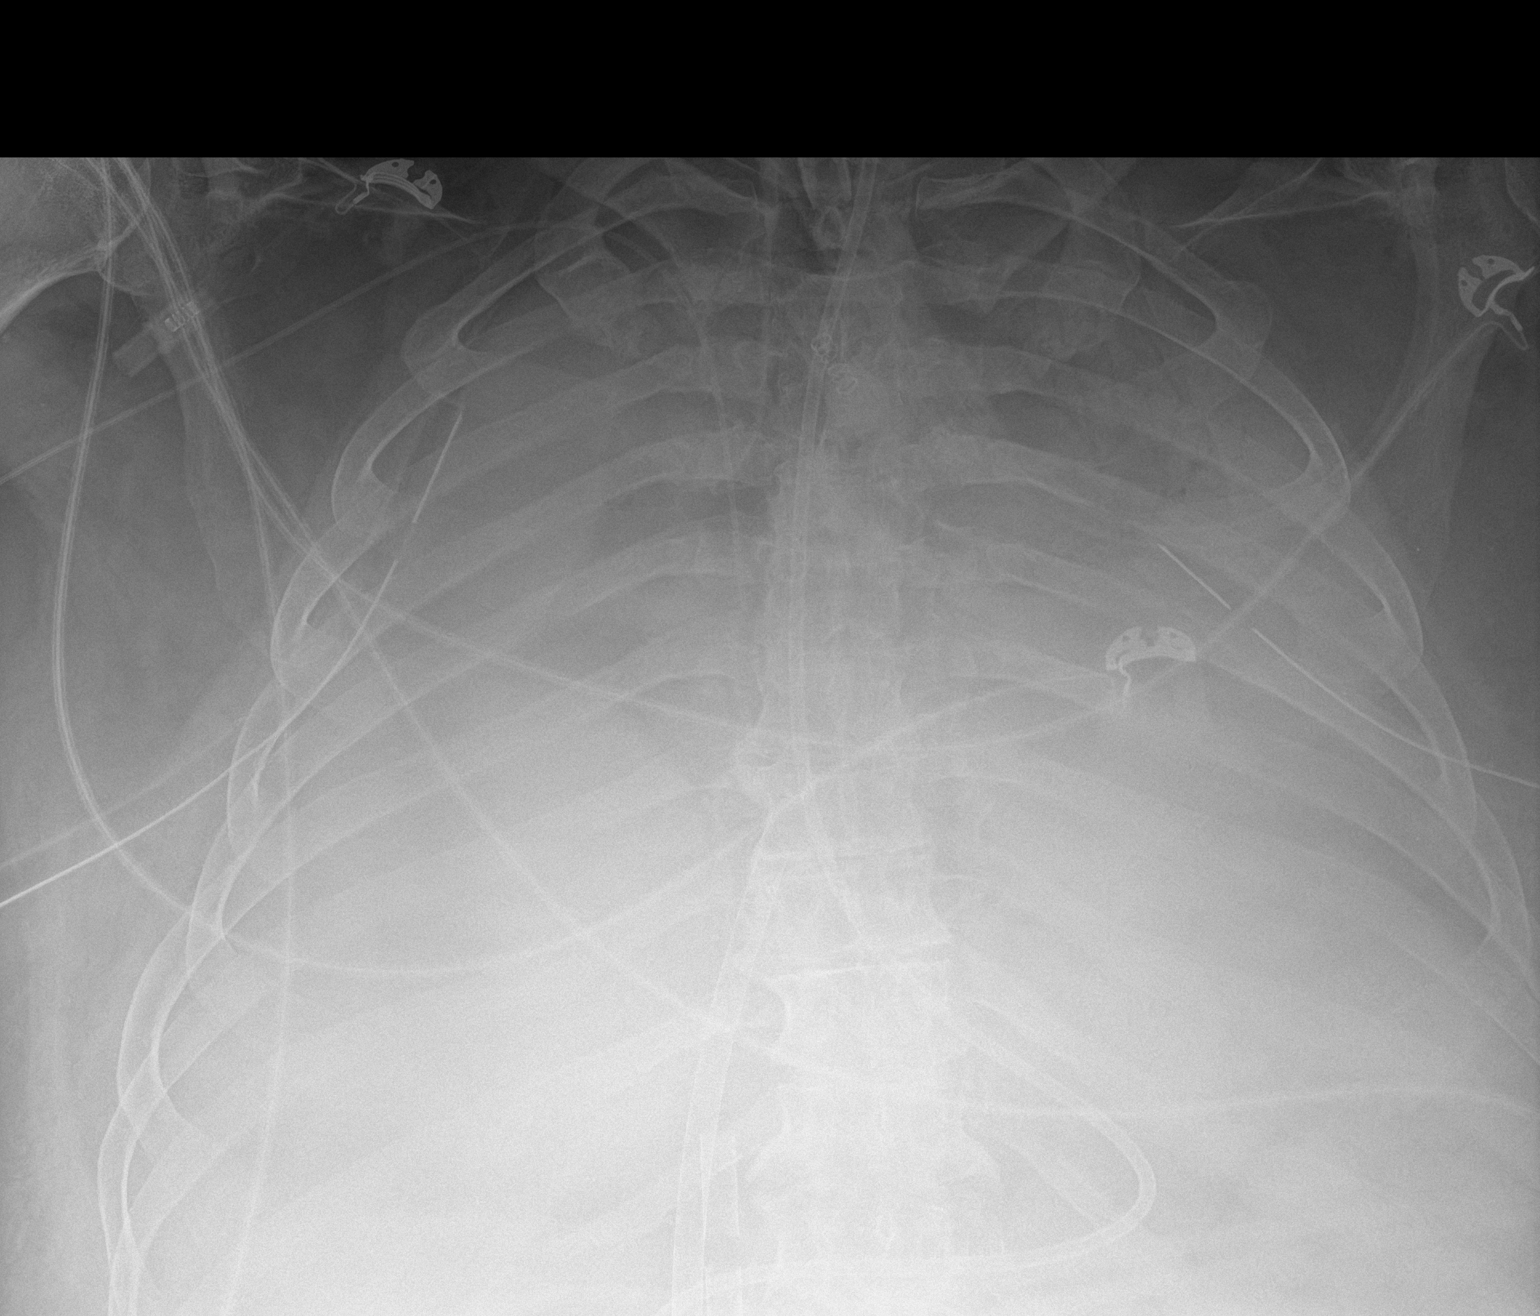

[1 of 1 positions shown; findings below may reference images not displayed]

FINDINGS: Tracheostomy is midline. Feeding tube is followed into the stomach
with the tip projecting beyond the inferior margin of the image.
Left IJ central line has been removed with placement of a right
PICC, tip in the region of the low SVC or SVC RA junction. Bilateral
chest tubes and 2 ECMO cannulas are in place. No pneumothorax.

Worsening severe bilateral airspace consolidation bilaterally, with
near total opacification of both hemi thoraces.
IMPRESSION: Worsening severe 7LCY2-N4 pneumonia.

## 2021-10-25 ENCOUNTER — Encounter: Payer: Self-pay | Admitting: Behavioral Health

## 2021-10-25 ENCOUNTER — Ambulatory Visit: Payer: BC Managed Care – PPO | Admitting: Behavioral Health

## 2021-10-25 ENCOUNTER — Other Ambulatory Visit: Payer: Self-pay

## 2021-10-25 DIAGNOSIS — F4321 Adjustment disorder with depressed mood: Secondary | ICD-10-CM | POA: Diagnosis not present

## 2021-10-25 DIAGNOSIS — F411 Generalized anxiety disorder: Secondary | ICD-10-CM

## 2021-10-25 DIAGNOSIS — F331 Major depressive disorder, recurrent, moderate: Secondary | ICD-10-CM | POA: Diagnosis not present

## 2021-10-25 DIAGNOSIS — F5105 Insomnia due to other mental disorder: Secondary | ICD-10-CM | POA: Diagnosis not present

## 2021-10-25 DIAGNOSIS — F329 Major depressive disorder, single episode, unspecified: Secondary | ICD-10-CM

## 2021-10-25 DIAGNOSIS — F39 Unspecified mood [affective] disorder: Secondary | ICD-10-CM

## 2021-10-25 DIAGNOSIS — F99 Mental disorder, not otherwise specified: Secondary | ICD-10-CM

## 2021-10-25 IMAGING — DX DG CHEST 1V PORT
1 series · 1 of 1 positions shown · non-contrast
Comparison: Radiograph 08/14/2019

CLINICAL DATA: ARDS, COVID positive 30 days prior, history of ECMO

EXAM:
PORTABLE CHEST 1 VIEW

[chest]
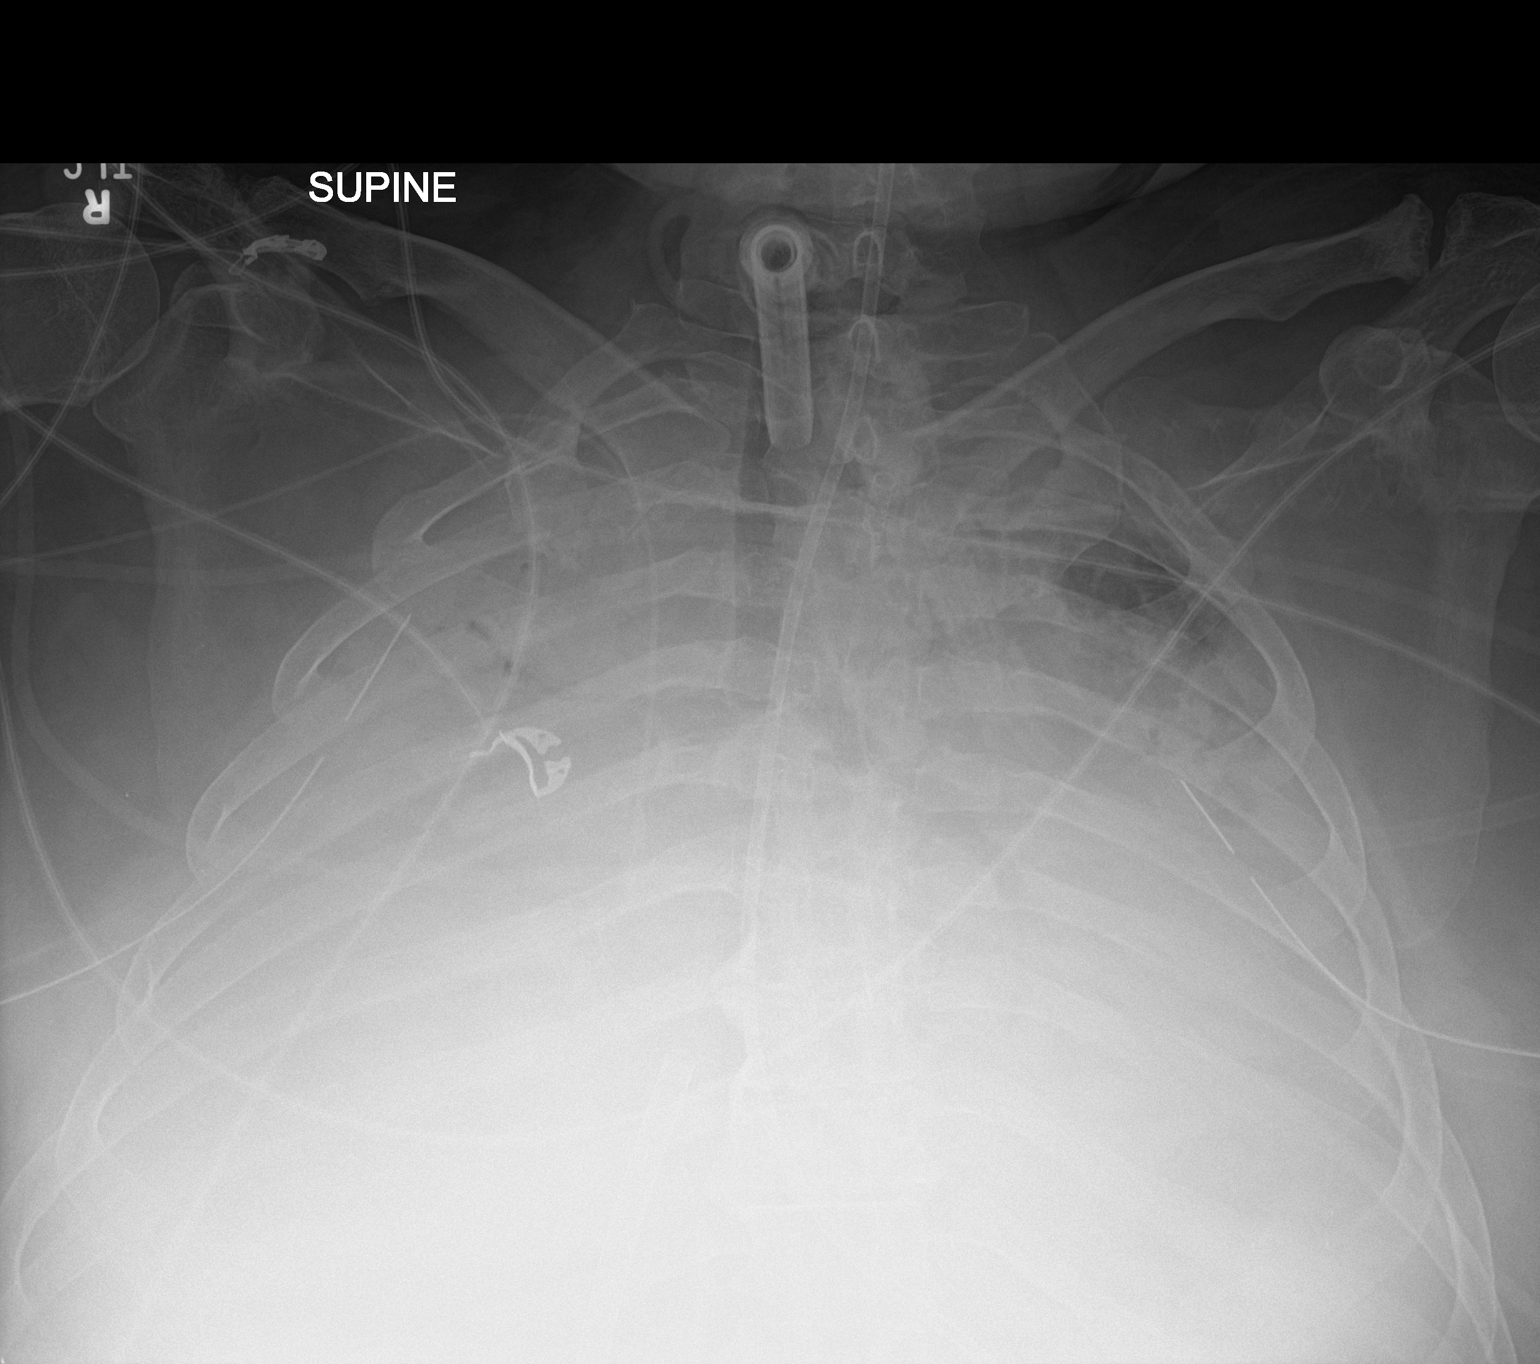

[1 of 1 positions shown; findings below may reference images not displayed]

FINDINGS: *Tracheostomy tube in the mid trachea.
*Transesophageal tube tip below the GE junction, beyond the level of
imaging.
*Right upper extremity PICC terminates at the superior cavoatrial
junction.
*ECMO cannula in stable position.
*Bilateral chest tubes in stable position.

Persistent dense airspace opacity throughout the right lung with
developing air bronchograms. Much of the left hemithorax is
opacified as well with some residual aerated lung in the apex. No
clear pneumothorax. Cardiomediastinal contours are obscured. No
acute osseous or soft tissue abnormality.
IMPRESSION: Persistent opacification of the entire right hemithorax with
developing air bronchograms.

Opacification the left hemithorax is similar to prior.

Stable appearance of support devices.

## 2021-10-25 MED ORDER — DULOXETINE HCL 60 MG PO CPEP: 60.0000 mg | ORAL_CAPSULE | Freq: Every day | ORAL | 3 refills | Status: DC

## 2021-10-25 NOTE — Progress Notes (Signed)
[facility] ? ?Patient ID: [name],  ?MRN: [id number] ? ?PCP: Michael Boston, MD ? ?Date of Evaluation: 10/25/2021 ?Time spent:30 minutes ? ?Chief Complaint:  ?Chief Complaint   ?Anxiety; Depression; Follow-up; Medication Refill ?  ? ? ?HISTORY/CURRENT STATUS: ?HPI ?46 year old male presents to this office for follow up and medicaiton management. Says that Lamictal has been working very well, but he would like to consider reducing. Says that his situation has improved somewhat. He is still going to wound care for feet but hopes he is healed enough to return to work by end of year. Says he gets a little depressed thinking about what he will be able to do when he starts again.  Says his relationship with his wife has improved. He says his anxiety level today is 3/10 and depression is 3/10. He is sleeping 7-8 hours per night. He says he does not want to make any medication changes at this time.. Denies mania, no psychosis, no current SI. No HI.  ?  ?No prior psychiatric medication trials ? ? ?Individual Medical History/ Review of Systems: Changes? :No  ? ?Allergies: Patient has no known allergies. ? ?Current Medications:  ?Current Outpatient Medications:  ?  albuterol (PROAIR HFA) 108 (90 Base) MCG/ACT inhaler, Inhale 2 puffs into the lungs every 4 (four) hours as needed for wheezing or shortness of breath., Disp: 18 g, Rfl: 1 ?  amLODipine (NORVASC) 2.5 MG tablet, Take 1 tablet (2.5 mg total) by mouth daily., Disp: 30 tablet, Rfl: 5 ?  budesonide-formoterol (SYMBICORT) 160-4.5 MCG/ACT inhaler, Inhale 2 puffs into the lungs 2 (two) times daily., Disp: 10.2 g, Rfl: 5 ?  DULoxetine (CYMBALTA) 60 MG capsule, Take 1 capsule (60 mg total) by mouth daily., Disp: 90 capsule, Rfl: 3 ?  HYDROcodone-acetaminophen (NORCO) 5-325 MG tablet, Take 1 tablet by mouth every 6 (six) hours as needed for moderate pain., Disp: 60 tablet, Rfl: 0 ?  lamoTRIgine (LAMICTAL) 25 MG tablet, Take two tablets at once daily., Disp:  60 tablet, Rfl: 3 ?  metoprolol tartrate (LOPRESSOR) 25 MG tablet, TAKE 1 TABLET BY MOUTH TWICE A DAY, Disp: 180 tablet, Rfl: 3 ?  montelukast (SINGULAIR) 10 MG tablet, Take 1 tablet (10 mg total) by mouth daily., Disp: 30 tablet, Rfl: 5 ?  pantoprazole (PROTONIX) 40 MG tablet, Take 1 tablet by mouth daily., Disp: , Rfl:  ?  traZODone (DESYREL) 100 MG tablet, Take 2 tablets (200 mg total) by mouth at bedtime., Disp: 90 tablet, Rfl: 3 ?  triamcinolone (NASACORT) 55 MCG/ACT AERO nasal inhaler, Place 2 sprays into the nose daily., Disp: , Rfl:  ?Medication Side Effects: none ? ?Family Medical/ Social History: Changes? No ? ?MENTAL HEALTH EXAM: ? ?There were no vitals taken for this visit.There is no height or weight on file to calculate BMI.  ?General Appearance: Casual and Neat  ?Eye Contact:  Good  ?Speech:  Clear and Coherent  ?Volume:  Normal  ?Mood:  Depressed  ?Affect:  Depressed  ?Thought Process:  Coherent  ?Orientation:  Full (Time, Place, and Person)  ?Thought Content: Logical   ?Suicidal Thoughts:  No  ?Homicidal Thoughts:  No  ?Memory:  WNL  ?Judgement:  Good  ?Insight:  Good  ?Psychomotor Activity:  Normal  ?Concentration:  Concentration: Good  ?Recall:  Good  ?Fund of Knowledge: Good  ?Language: Good  ?Assets:  Desire for Improvement  ?ADL's:  Intact  ?Cognition: WNL  ?Prognosis:  Good  ? ? ?DIAGNOSES:  ?  ICD-10-CM   ?1. Generalized anxiety disorder  F41.1 DULoxetine (CYMBALTA) 60 MG capsule  ?  ?2. Major depressive disorder, recurrent episode, moderate (HCC)  F33.1 DULoxetine (CYMBALTA) 60 MG capsule  ?  ?3. Situational depression  F43.21 DULoxetine (CYMBALTA) 60 MG capsule  ?  ?4. Insomnia due to other mental disorder  F51.05   ? F99   ?  ?5. Unspecified mood (affective) disorder (HCC)  F39   ?  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  ? ? ?Receiving Psychotherapy: No  ? ? ?RECOMMENDATIONS:  ? ?Greater than 50% of 30 min face to face time with patient was spent on counseling and coordination  of care. We discussed patients chronic health concerns and his continue wound care for feet. He hope to be able to work again by end of year. Discussed medication options, side effects and his goals for tx. Recommended Psychotherapy to Pt. ?We agreed to: ?Will continue Cymbalta 60 mg daily ?Continue Trazodone 200 mg at bedtime ?Continue Abilify 2 mg daily at bedtime ?To reduce Lamictal to 25 mg daily, patients request.  ?Will report any side effects or worsening symptoms ?To follow up in 4 weeks to reassess ?Provided emergency contact information ?Reviewed PDMP ?  ? ? ? ? ?Elwanda Brooklyn, NP  ?

## 2021-10-26 IMAGING — DX DG CHEST 1V PORT
1 series · 1 of 1 positions shown · non-contrast
Comparison: Yesterday

CLINICAL DATA: COVID and history of ECMO

EXAM:
PORTABLE CHEST 1 VIEW

[chest]
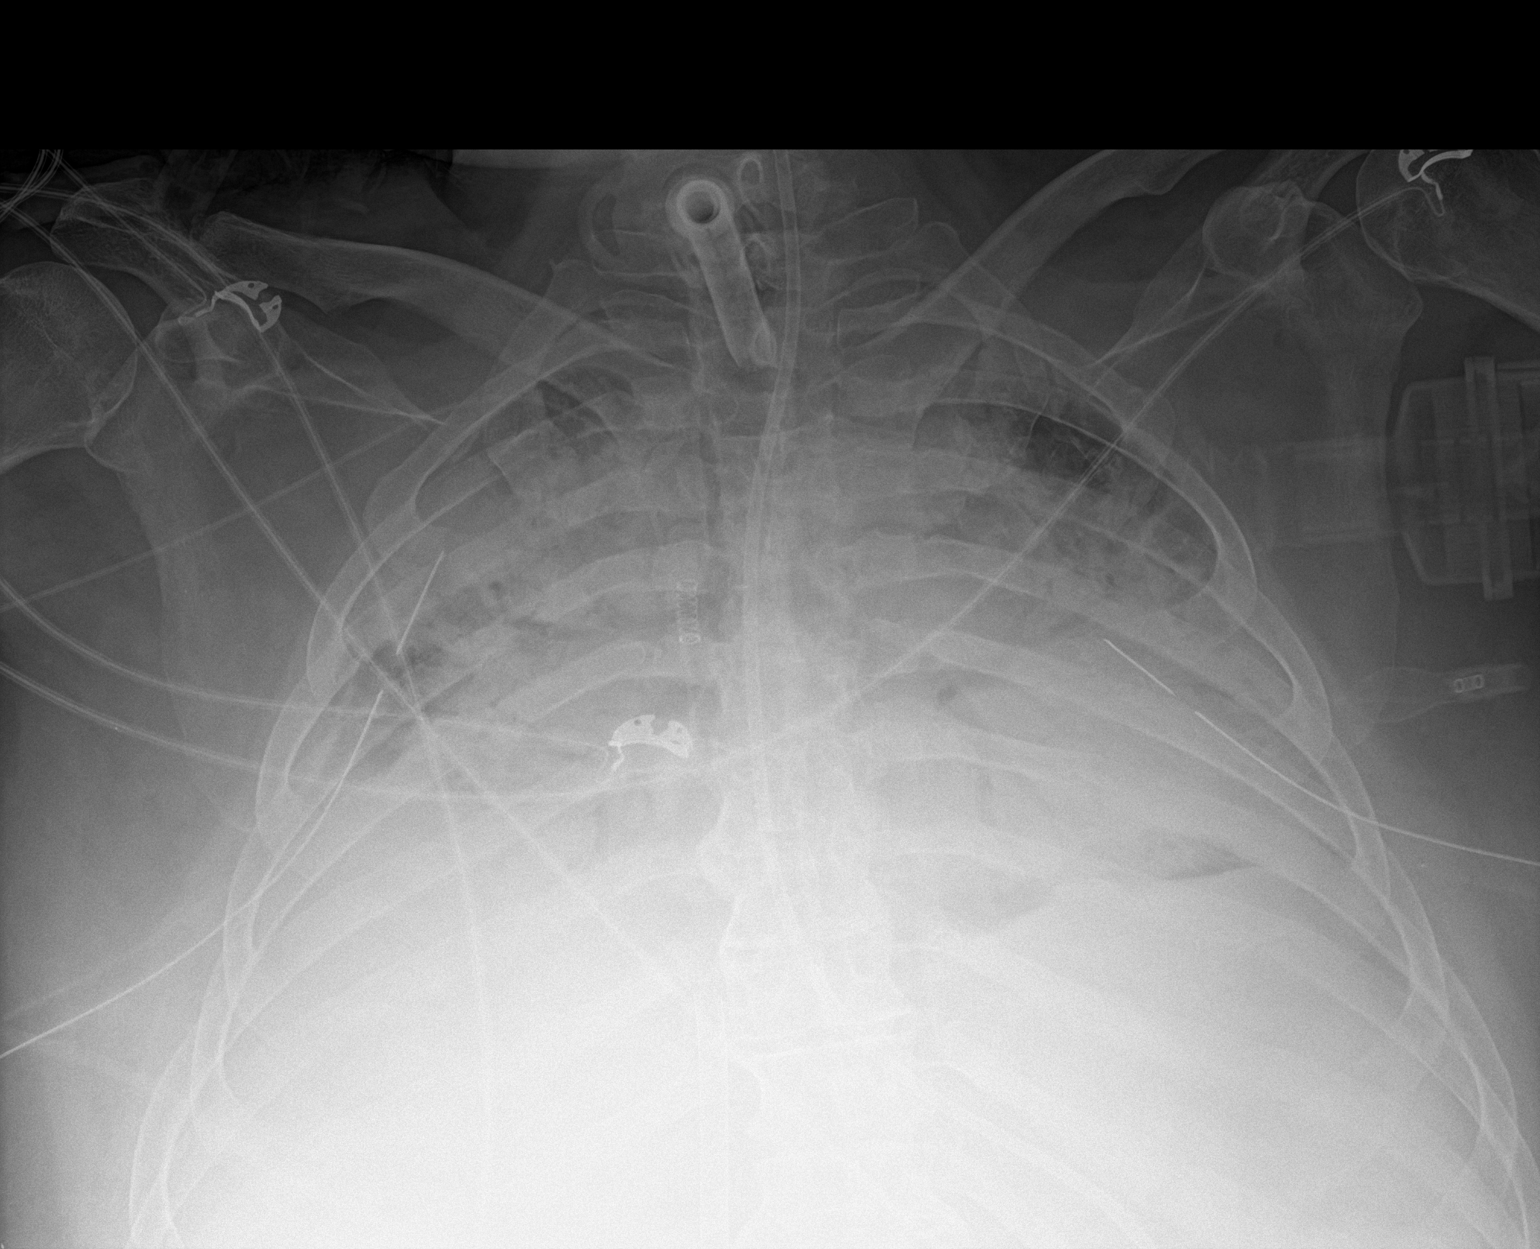

[1 of 1 positions shown; findings below may reference images not displayed]

FINDINGS: Tracheostomy tube and feeding tube. Bilateral chest tubes.
Large-bore cannula over the hepatic cava. Right upper extremity PICC
with tip at the upper cavoatrial junction. Mild increase in aeration
but still severely low volumes with dense consolidation. No visible
pneumothorax. Stable enlarged heart
IMPRESSION: 1. Stable hardware positioning.
2. Mild improvement in aeration.
3. No visible pneumothorax.

## 2021-10-27 IMAGING — DX DG CHEST 1V PORT
1 series · 1 of 1 positions shown · non-contrast
Comparison: 08/16/2019

CLINICAL DATA: Personal history of ECMO. Respiratory failure.

EXAM:
PORTABLE CHEST 1 VIEW

[view not recorded]
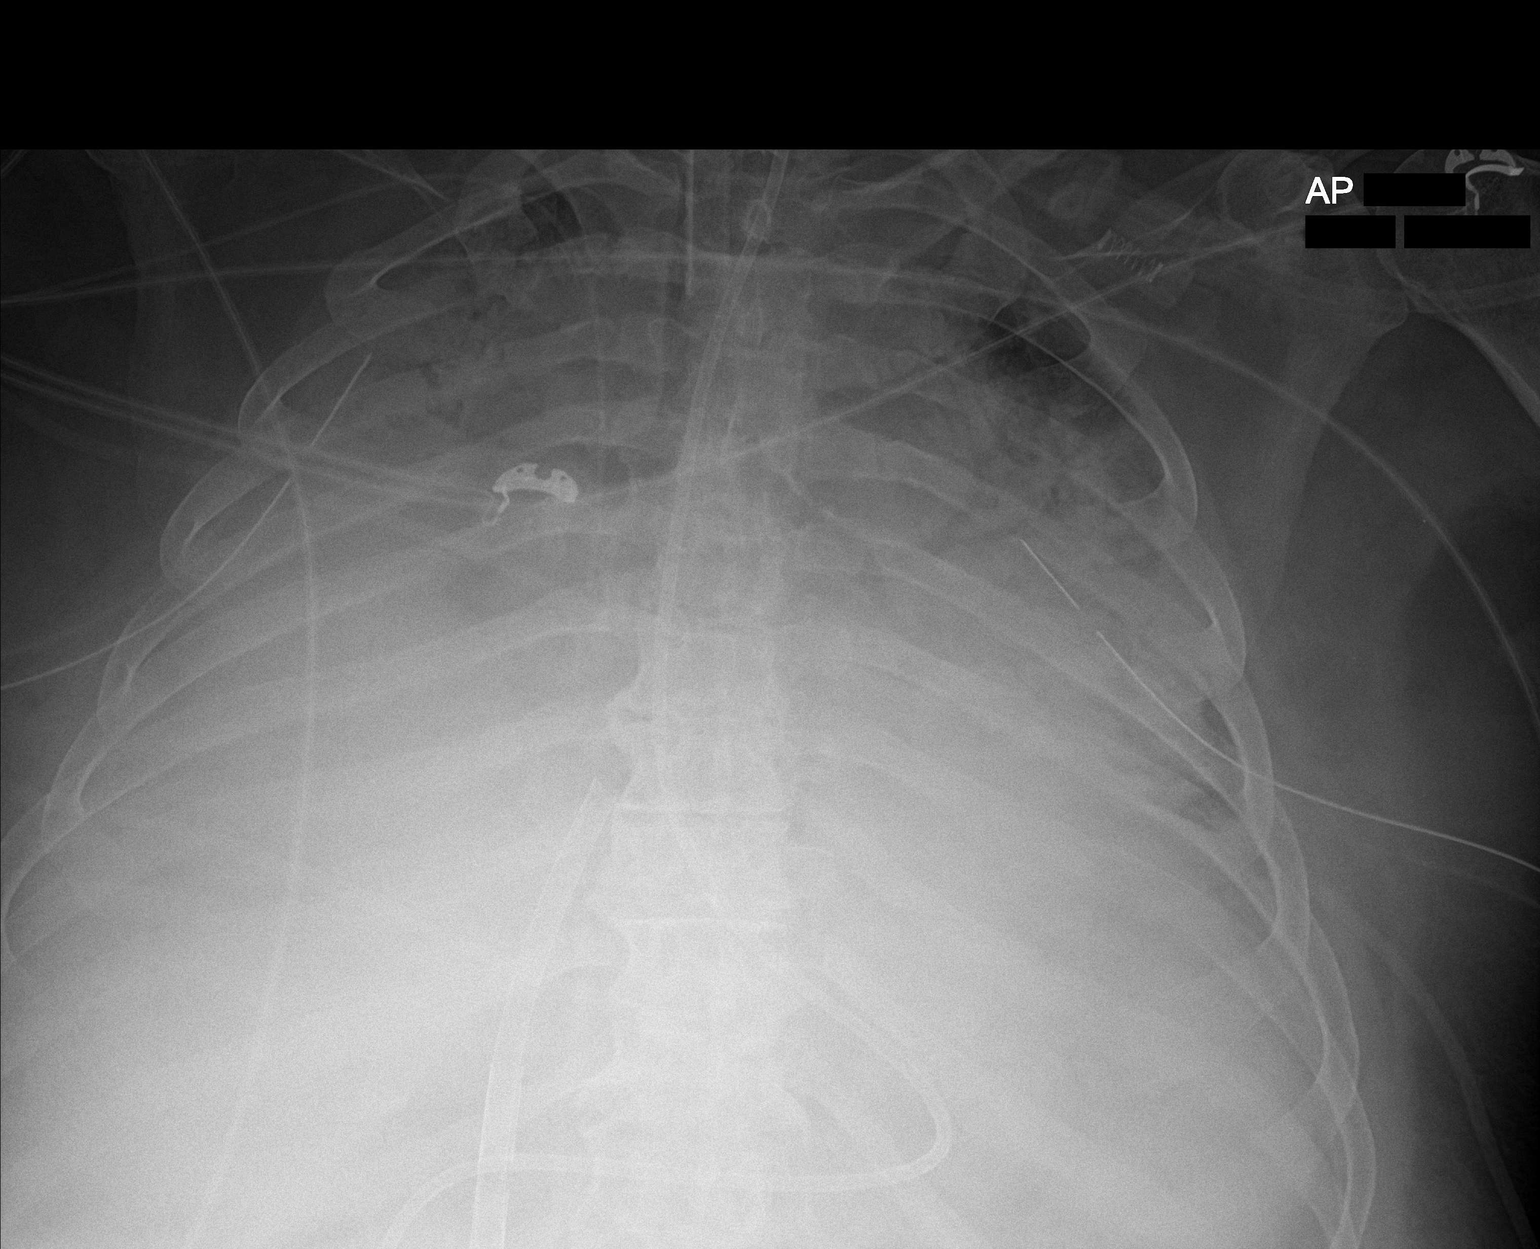

[1 of 1 positions shown; findings below may reference images not displayed]

FINDINGS: Tracheostomy tube is unchanged. A feeding tube courses into the
abdomen with tip not imaged. Bilateral chest tubes remain in place.
A right PICC is unchanged, and there is a persistent large bore
cannula in the expected vicinity of the inferior cavoatrial
junction. Lung volumes remain low with severe airspace consolidation
throughout both lungs, not significantly changed. No pneumothorax is
identified.
IMPRESSION: Unchanged appearance of the chest with severe bilateral airspace
consolidation.

## 2021-10-27 IMAGING — DX DG CHEST 1V PORT
1 series · 2 of 2 positions shown · non-contrast
Comparison: Earlier chest radiograph dated 08/17/2019.

CLINICAL DATA: 43-year-old male with hypoxia.

EXAM:
PORTABLE CHEST 1 VIEW

[Series 1: chest ap · 0.14mm/px · 2 of 2 slices shown]
[im 1/2]
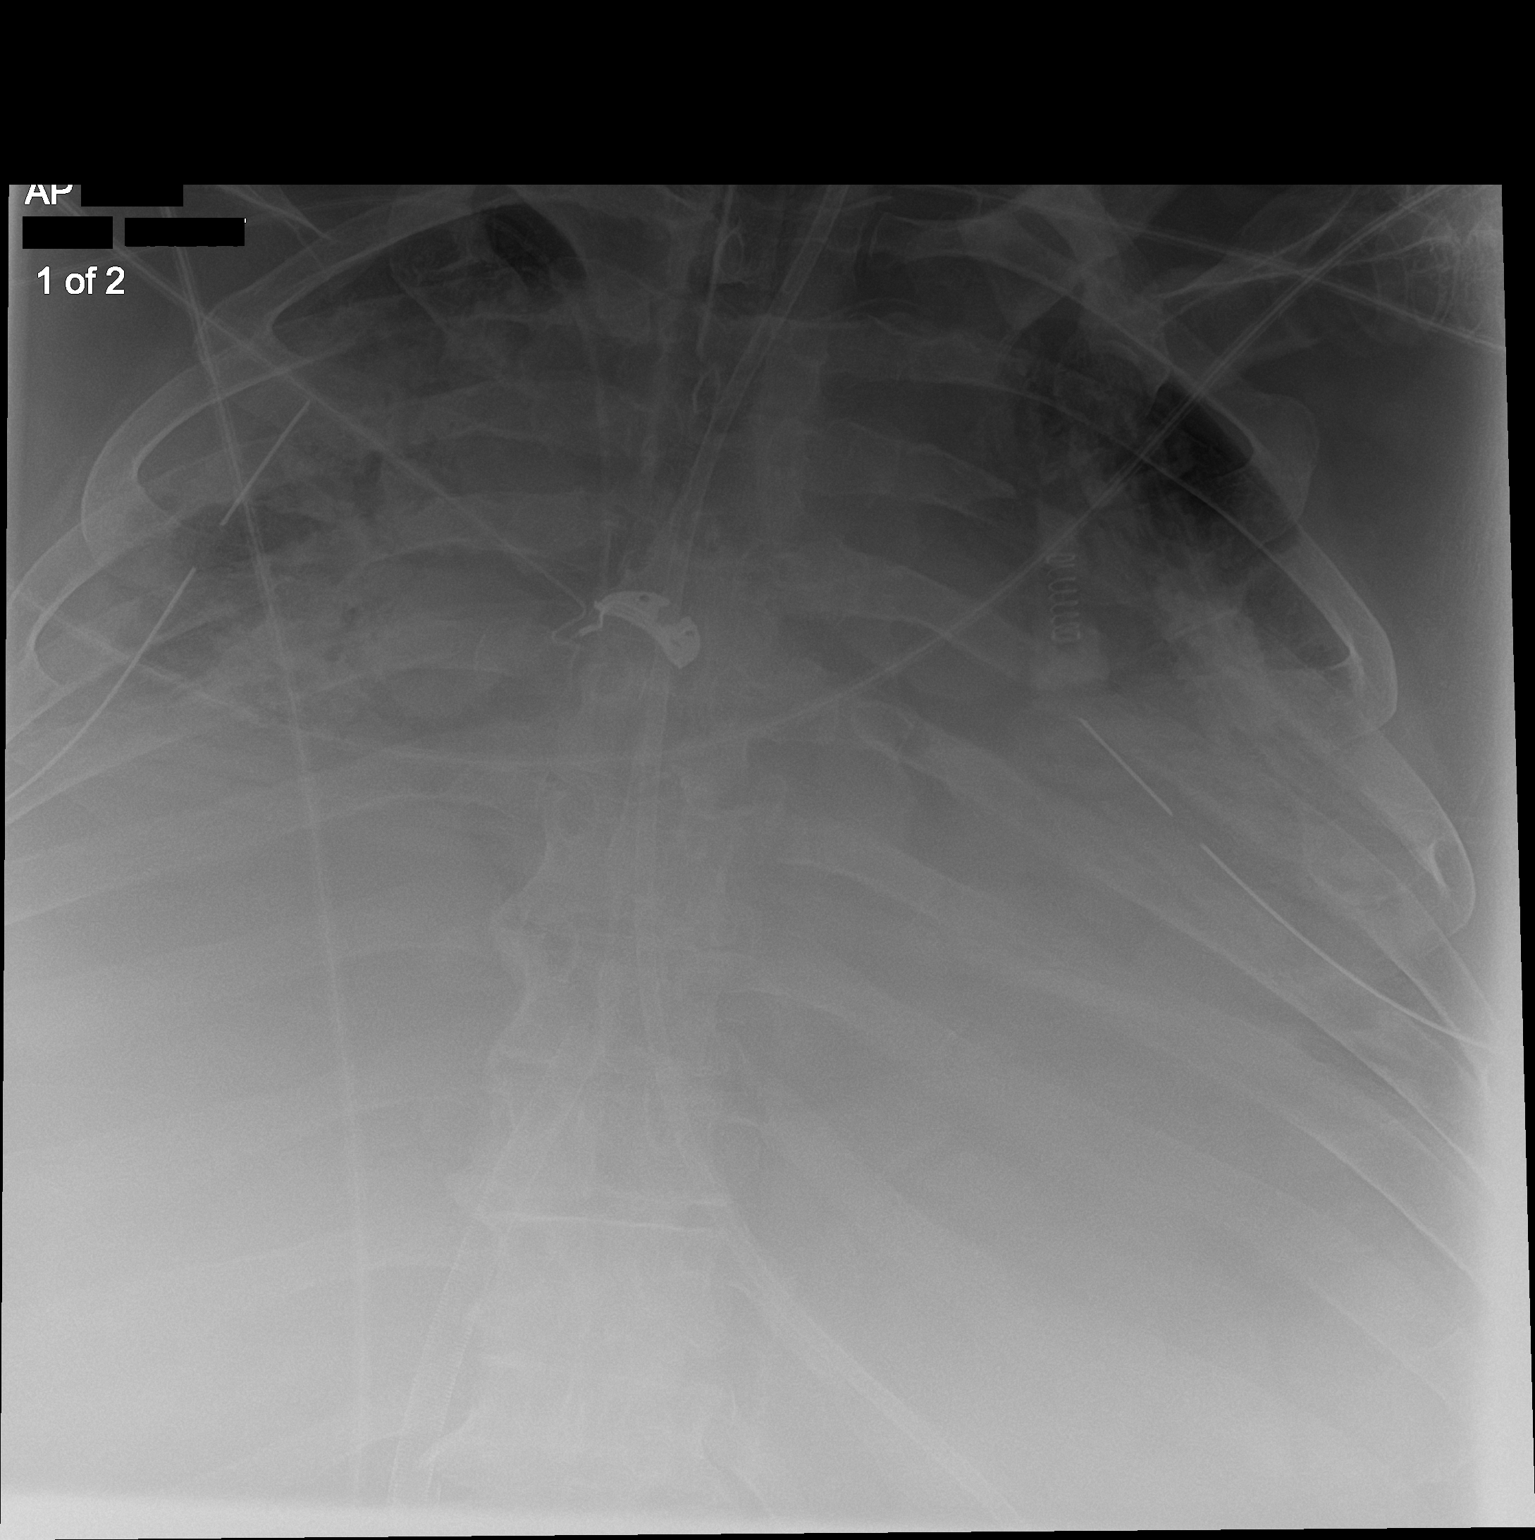
[im 2/2]
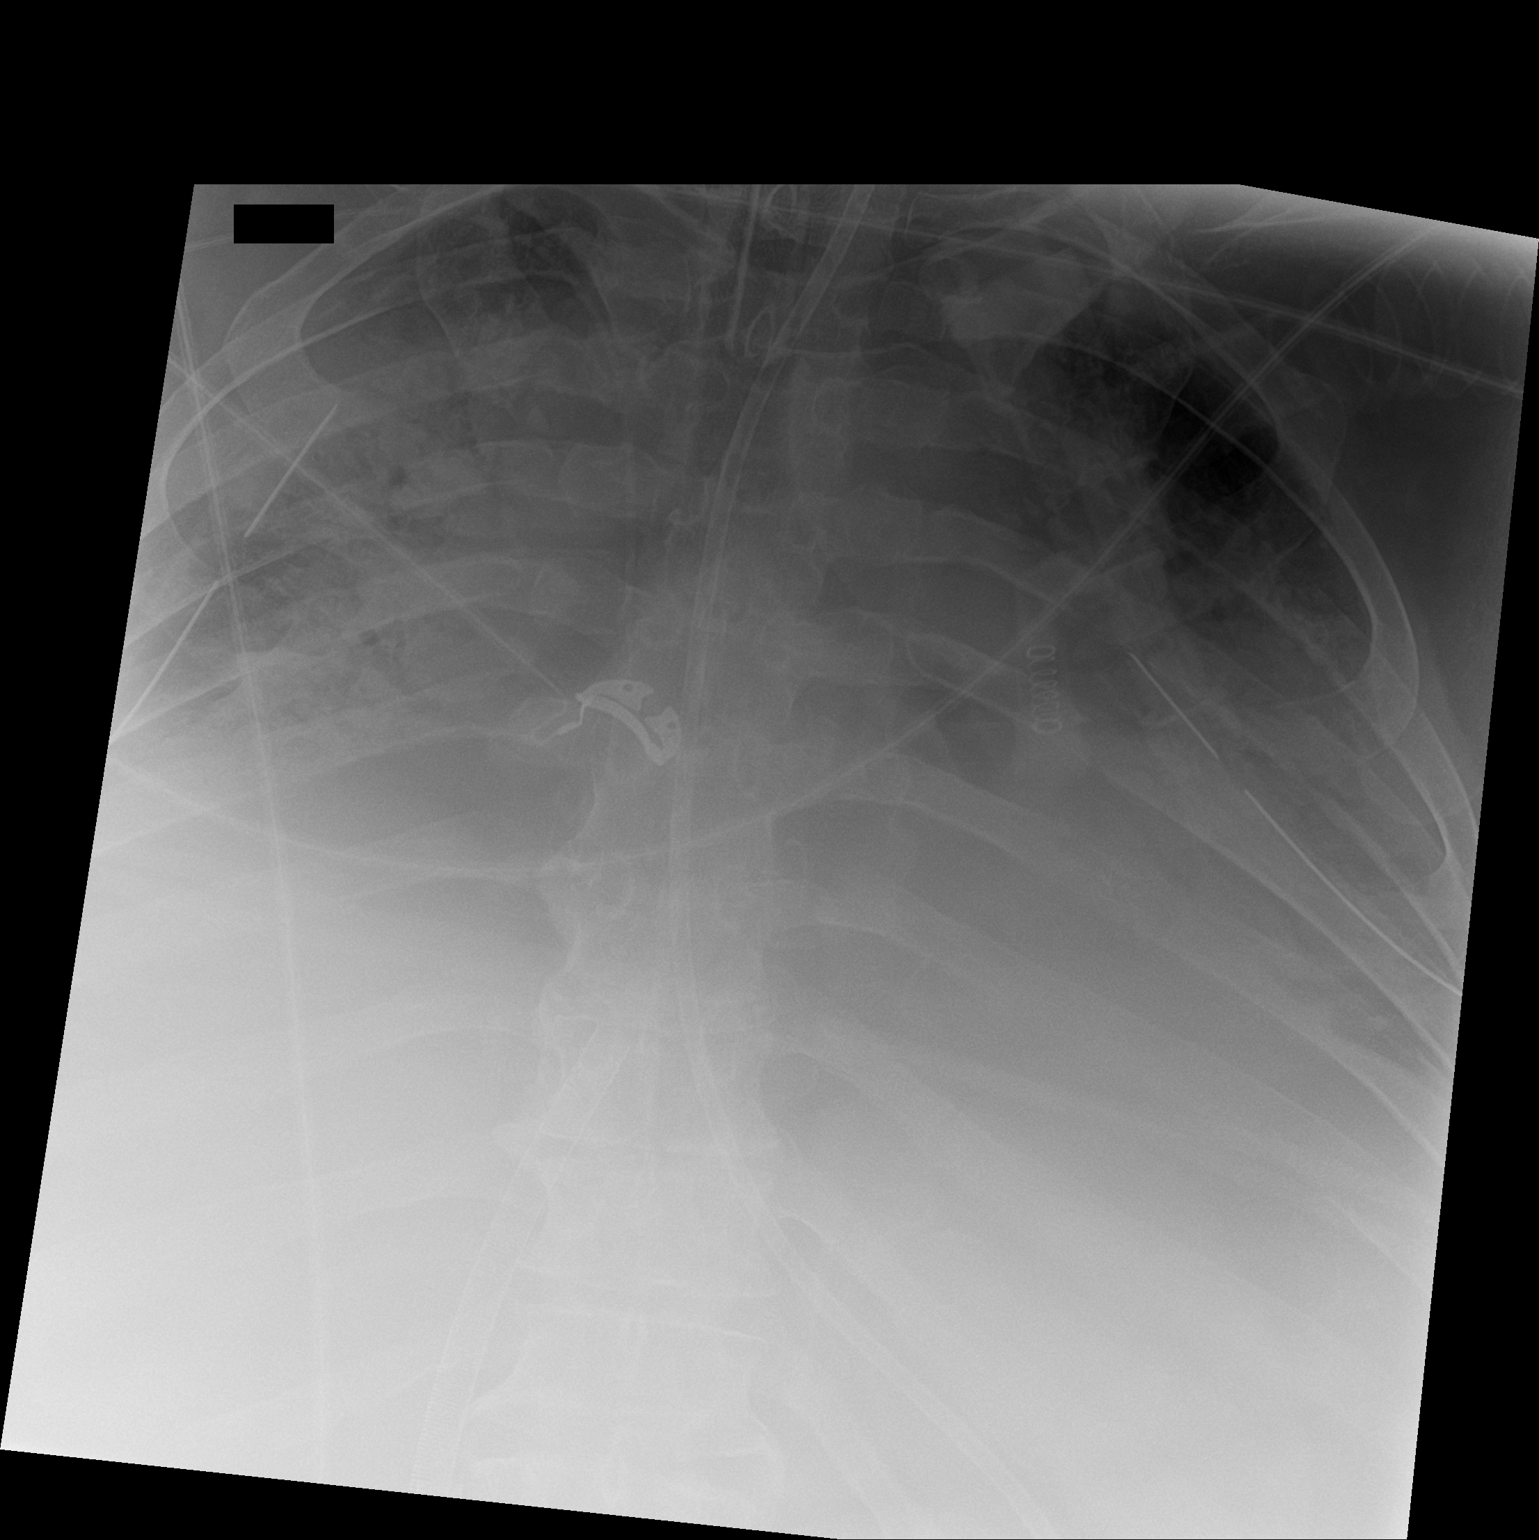

[2 of 2 positions shown; findings below may reference images not displayed]

FINDINGS: Partially visualized endotracheal tube with tip approximately 3.3 cm
above the carina. Feeding tube extends below the diaphragm with tip
beyond the inferior margin of the image. Bilateral chest tubes in
similar position. Right-sided PICC and a cannula over the expected
location of the IVC similar to prior radiograph.

Shallow inspiration with extensive bilateral airspace opacities
without significant interval change. The cardiac borders are
silhouetted. No pneumothorax. No acute osseous pathology.
IMPRESSION: 1. Shallow inspiration with extensive bilateral airspace opacity
without significant interval change.
2. Support apparatus in stable position.

## 2021-10-28 ENCOUNTER — Encounter (HOSPITAL_BASED_OUTPATIENT_CLINIC_OR_DEPARTMENT_OTHER): Payer: BC Managed Care – PPO | Admitting: General Surgery

## 2021-10-28 ENCOUNTER — Other Ambulatory Visit: Payer: Self-pay

## 2021-10-28 DIAGNOSIS — L97522 Non-pressure chronic ulcer of other part of left foot with fat layer exposed: Secondary | ICD-10-CM | POA: Diagnosis not present

## 2021-10-28 DIAGNOSIS — L97512 Non-pressure chronic ulcer of other part of right foot with fat layer exposed: Secondary | ICD-10-CM | POA: Diagnosis not present

## 2021-10-28 DIAGNOSIS — Z8616 Personal history of COVID-19: Secondary | ICD-10-CM | POA: Diagnosis not present

## 2021-10-28 DIAGNOSIS — I10 Essential (primary) hypertension: Secondary | ICD-10-CM | POA: Diagnosis not present

## 2021-10-28 NOTE — Progress Notes (Signed)
[name], [name] ([id number]) ?Visit Report for 10/28/2021 ?Arrival Information Details ?Patient Name: Date of Service: ?[name], [name] 10/28/2021 11:00 A M ?Medical Record Number: [id number] ?Patient Account Number: [id number] ?Date of Birth/Sex: Treating RN: ?November 23, 1975 (46 y.o. [name]) [name] ?Primary Care Lianna Sitzmann: [name] Other Clinician: ?Referring Linnaea Ahn: ?Treating Gurneet Matarese/Extender: Fredirick Maudlin ?[name] ?Weeks in Treatment: 50 ?Visit Information History Since Last Visit ?Added or deleted any medications: No ?Patient Arrived: Wheel Chair ?Any new allergies or adverse reactions: No ?Arrival Time: 11:31 ?Had a fall or experienced change in No ?Accompanied By: self ?activities of daily living that may affect ?Transfer Assistance: None ?risk of falls: ?Patient Identification Verified: Yes ?Signs or symptoms of abuse/neglect since last visito No ?Patient Requires Transmission-Based Precautions: No ?Hospitalized since last visit: No ?Patient Has Alerts: No ?Implantable device outside of the clinic excluding No ?cellular tissue based products placed in the center ?since last visit: ?Has Dressing in Place as Prescribed: Yes ?Pain Present Now: Yes ?Electronic Signature(s) ?Signed: 10/28/2021 1:23:58 PM By: [name] ?Entered By: [name] on 10/28/2021 11:31:52 ?-------------------------------------------------------------------------------- ?Encounter Discharge Information Details ?Patient Name: Date of Service: ?[name], [name] 10/28/2021 11:00 A M ?Medical Record Number: [id number] ?Patient Account Number: [id number] ?Date of Birth/Sex: Treating RN: ?Mar 25, 1976 (46 y.o. [name]) [name] ?Primary Care Aurore Redinger: [name] Other Clinician: ?Referring [name]: ?Treating [name]/Extender: Fredirick Maudlin ?[name] ?Weeks in Treatment: 73 ?Encounter Discharge Information Items Post Procedure Vitals ?Discharge Condition: Stable ?Temperature (F): 98.8 ?Ambulatory Status:  Wheelchair ?Pulse (bpm): 69 ?Discharge Destination: Home ?Respiratory Rate (breaths/min): 16 ?Transportation: Private Auto ?Blood Pressure (mmHg): 138/81 ?Accompanied By: Family member ?Schedule Follow-up Appointment: Yes ?Clinical Summary of Care: Patient Declined ?Electronic Signature(s) ?Signed: 10/28/2021 1:23:58 PM By: [name] ?Entered By: [name] on 10/28/2021 12:26:51 ?-------------------------------------------------------------------------------- ?Lower Extremity Assessment Details ?Patient Name: ?Date of Service: ?[name], [place] E. 10/28/2021 11:00 A M ?Medical Record Number: [id number] ?Patient Account Number: [id number] ?Date of Birth/Sex: ?Treating RN: ?01/20/1976 (46 y.o. [name]) [name] ?Primary Care [name]: [name] ?Other Clinician: ?Referring [name]: ?Treating Bryanda Mikel/Extender: Fredirick Maudlin ?[name] ?Weeks in Treatment: 33 ?Edema Assessment ?Assessed: [Left: No] [Right: No] ?Edema: [Left: No] [Right: No] ?Calf ?Left: Right: ?Point of Measurement: 29 cm From Medial Instep 43 cm 43 cm ?Ankle ?Left: Right: ?Point of Measurement: 9 cm From Medial Instep 25.2 cm 26 cm ?Electronic Signature(s) ?Signed: 10/28/2021 1:23:58 PM By: [name] ?Entered By: [name] on 10/28/2021 11:35:01 ?-------------------------------------------------------------------------------- ?Multi Wound Chart Details ?Patient Name: ?Date of Service: ?[name], [place] E. 10/28/2021 11:00 A M ?Medical Record Number: [id number] ?Patient Account Number: [id number] ?Date of Birth/Sex: ?Treating RN: ?1976/08/05 (46 y.o. [name]) [name] ?Primary Care Anarie Kalish: [name] ?Other Clinician: ?Referring [name]: ?Treating [name]/Extender: Fredirick Maudlin ?[name] ?Weeks in Treatment: 78 ?Vital Signs ?Height(in): 69 ?Pulse(bpm): 69 ?Weight(lbs): 280 ?Blood Pressure(mmHg): 138/81 ?Body Mass Index(BMI): 41.3 ?Temperature(??F): 98.8 ?Respiratory Rate(breaths/min): 16 ?Photos: [N/A:N/A] ?Right  Calcaneus Left Calcaneus N/A ?Wound Location: ?Pressure Injury Pressure Injury N/A ?Wounding Event: ?Pressure Ulcer Pressure Ulcer N/A ?Primary Etiology: ?Asthma, Angina, Hypertension Asthma, Angina, Hypertension N/A ?Comorbid History: ?10/07/2019 10/07/2019 N/A ?Date Acquired: ?61 67 N/A ?Weeks of Treatment: ?Open Open N/A ?Wound Status: ?No No N/A ?Wound Recurrence: ?2x0.3x0.3 2.6x0.7x0.3 N/A ?Measurements L x W x D (cm) ?0.471 1.429 N/A ?A (cm?) : ?rea ?0.141 0.429 N/A ?Volume (cm?) : ?98.50% 94.70% N/A ?% Reduction in A rea: ?99.60% 98.40% N/A ?% Reduction in Volume: ?Category/Stage III Category/Stage III N/A ?Classification: ?  Medium Medium N/A ?Exudate A mount: ?Serosanguineous Serosanguineous N/A ?Exudate Type: ?red, brown red, brown N/A ?Exudate Color: ?Distinct, outline attached Distinct, outline attached N/A ?Wound Margin: ?Small (1-33%) Small (1-33%) N/A ?Granulation A mount: ?Pink Red N/A ?Granulation Quality: ?Large (67-100%) Large (67-100%) N/A ?Necrotic A mount: ?Fat Layer (Subcutaneous Tissue): Yes Fat Layer (Subcutaneous Tissue): Yes N/A ?Exposed Structures: ?Fascia: No ?Fascia: No ?Tendon: No ?Tendon: No ?Muscle: No ?Muscle: No ?Joint: No ?Joint: No ?Bone: No ?Bone: No ?Small (1-33%) None N/A ?Epithelialization: ?Debridement - Selective/Open Wound Debridement - Selective/Open Wound N/A ?Debridement: ?Pre-procedure Verification/Time Out 11:42 11:42 N/A ?Taken: ?Lidocaine 5% topical ointment Lidocaine 5% topical ointment N/A ?Pain Control: ?Necrotic/Eschar, Psychologist, prison and probation services, [place]issue Debrided: ?Non-Viable Tissue Non-Viable Tissue N/A ?Level: ?0.6 1.82 N/A ?Debridement A (sq cm): ?rea ?Curette Curette N/A ?Instrument: ?Minimum Minimum N/A ?Bleeding: ?Pressure Pressure N/A ?Hemostasis A chieved: ?0 0 N/A ?Procedural Pain: ?0 0 N/A ?Post Procedural Pain: ?Procedure was tolerated well Procedure was tolerated well N/A ?Debridement Treatment Response: ?2x0.3x0.3 2.6x0.7x0.3 N/A ?Post Debridement  Measurements L x ?W x D (cm) ?0.141 0.429 N/A ?Post Debridement Volume: (cm?) ?Category/Stage III Category/Stage III N/A ?Post Debridement Stage: ?Debridement Debridement N/A ?Procedures Performed: ?Treatment Notes ?Electronic Signature(s) ?Signed: 10/28/2021 12:12:51 PM By: Fredirick Maudlin MD FACS ?Signed: 10/28/2021 1:23:58 PM By: [name] ?Entered By: Fredirick Maudlin on 10/28/2021 12:12:51 ?-------------------------------------------------------------------------------- ?Multi-Disciplinary Care Plan Details ?Patient Name: ?Date of Service: ?[name], [place] E. 10/28/2021 11:00 A M ?Medical Record Number: [id number] ?Patient Account Number: [id number] ?Date of Birth/Sex: ?Treating RN: ?1976/06/06 (46 y.o. [name]) [name] ?Primary Care Jamarr Treinen: [name] ?Other Clinician: ?Referring Rosamond Andress: ?Treating Brock Mokry/Extender: Fredirick Maudlin ?[name] ?Weeks in Treatment: 81 ?Multidisciplinary Care Plan reviewed with physician ?Active Inactive ?Wound/Skin Impairment ?Nursing Diagnoses: ?Impaired tissue integrity ?Knowledge deficit related to ulceration/compromised skin integrity ?Goals: ?Patient/caregiver will verbalize understanding of skin care regimen ?Date Initiated: 07/15/2020 ?Target Resolution Date: 11/12/2021 ?Goal Status: Active ?Ulcer/skin breakdown will have a volume reduction of 30% by week 4 ?Date Initiated: 07/15/2020 ?Date Inactivated: 08/20/2020 ?Target Resolution Date: 09/03/2020 ?Goal Status: Unmet ?Unmet Reason: no major changes. ?Ulcer/skin breakdown will heal within 14 weeks ?Date Initiated: 12/04/2020 ?Date Inactivated: 12/10/2020 ?Target Resolution Date: 12/10/2020 ?Unmet Reason: wounds still open at 14 ?Goal Status: Unmet ?weeks and today 21 weeks. ?Interventions: ?Assess patient/caregiver ability to obtain necessary supplies ?Assess patient/caregiver ability to perform ulcer/skin care regimen upon admission and as needed ?Assess ulceration(s) every visit ?Provide education on ulcer and skin  care ?Treatment Activities: ?Skin care regimen initiated : 07/15/2020 ?Topical wound management initiated : 07/15/2020 ?Notes: ?Electronic Signature(s) ?Signed: 10/28/2021 1:23:58 PM By: [name] ?Inetta Fermo

## 2021-10-28 NOTE — Progress Notes (Signed)
BRINTON, BRANDEL ([id number]) ?Visit Report for 10/28/2021 ?Chief Complaint Document Details ?Patient Name: Date of Service: ?[name], [name] 10/28/2021 11:00 A M ?Medical Record Number: [id number] ?Patient Account Number: [id number] ?Date of Birth/Sex: Treating RN: ?09/10/1975 (46 y.o. [name]) [name] ?Primary Care Provider: Cristie [name] Other Clinician: ?Referring Provider: ?Treating Provider/Extender: [name] ?[name] ?Weeks in Treatment: 74 ?Information Obtained from: Patient ?Chief Complaint ?Bilateral Plantar Foot Ulcers ?Electronic Signature(s) ?Signed: 10/28/2021 12:13:00 PM By: [name] Maudlin MD FACS ?Entered By: [name] on 10/28/2021 12:13:00 ?-------------------------------------------------------------------------------- ?Debridement Details ?Patient Name: Date of Service: ?[name], [name] 10/28/2021 11:00 A M ?Medical Record Number: [id number] ?Patient Account Number: [id number] ?Date of Birth/Sex: Treating RN: ?15-Mar-1976 (46 y.o. [name]) [name] ?Primary Care Provider: Cristie [name] Other Clinician: ?Referring Provider: ?Treating Provider/Extender: [name] ?[name] ?Weeks in Treatment: 31 ?Debridement Performed for Assessment: Wound #1 Right Calcaneus ?Performed By: Physician [name] Maudlin, MD ?Debridement Type: Debridement ?Level of Consciousness (Pre-procedure): Awake and Alert ?Pre-procedure Verification/Time Out Yes - 11:42 ?Taken: ?Start Time: 11:42 ?Pain Control: Lidocaine 5% topical ointment ?T Area Debrided (L x W): ?otal 2 (cm) x 0.3 (cm) = 0.6 (cm?) ?Tissue and other material debrided: Non-Viable, Eschar, [name], [name] ?Level: Non-Viable Tissue ?Debridement Description: Selective/Open Wound ?Instrument: Curette ?Bleeding: Minimum ?Hemostasis Achieved: Pressure ?End Time: 11:43 ?Procedural Pain: 0 ?Post Procedural Pain: 0 ?Response to Treatment: Procedure was tolerated well ?Level of Consciousness (Post- Awake and Alert ?procedure): ?Post Debridement  Measurements of Total Wound ?Length: (cm) 2 ?Stage: Category/Stage III ?Width: (cm) 0.3 ?Depth: (cm) 0.3 ?Volume: (cm?) 0.141 ?Character of Wound/Ulcer Post Debridement: Improved ?Post Procedure Diagnosis ?Same as Pre-procedure ?Electronic Signature(s) ?Signed: 10/28/2021 12:32:07 PM By: [name] Maudlin MD FACS ?Signed: 10/28/2021 1:23:58 PM By: [name] Catholic RN ?Entered By: [name] on 10/28/2021 11:46:39 ?-------------------------------------------------------------------------------- ?Debridement Details ?Patient Name: ?Date of Service: ?[name], [name] 10/28/2021 11:00 A M ?Medical Record Number: [id number] ?Patient Account Number: [id number] ?Date of Birth/Sex: ?Treating RN: ?03-29-1976 (46 y.o. [name]) [name] ?Primary Care Provider: Cristie [name] ?Other Clinician: ?Referring Provider: ?Treating Provider/Extender: [name] ?[name] ?Weeks in Treatment: 26 ?Debridement Performed for Assessment: Wound #2 Left Calcaneus ?Performed By: Physician [name] Maudlin, MD ?Debridement Type: Debridement ?Level of Consciousness (Pre-procedure): Awake and Alert ?Pre-procedure Verification/Time Out Yes - 11:42 ?Taken: ?Start Time: 11:42 ?Pain Control: Lidocaine 5% topical ointment ?T Area Debrided (L x W): ?otal 2.6 (cm) x 0.7 (cm) = 1.82 (cm?) ?Tissue and other material debrided: Non-Viable, Eschar, [place], [place] ?Level: Non-Viable Tissue ?Debridement Description: Selective/Open Wound ?Instrument: Curette ?Bleeding: Minimum ?Hemostasis Achieved: Pressure ?End Time: 11:43 ?Procedural Pain: 0 ?Post Procedural Pain: 0 ?Response to Treatment: Procedure was tolerated well ?Level of Consciousness (Post- Awake and Alert ?procedure): ?Post Debridement Measurements of Total Wound ?Length: (cm) 2.6 ?Stage: Category/Stage III ?Width: (cm) 0.7 ?Depth: (cm) 0.3 ?Volume: (cm?) 0.429 ?Character of Wound/Ulcer Post Debridement: Improved ?Post Procedure Diagnosis ?Same as Pre-procedure ?Electronic Signature(s) ?Signed:  10/28/2021 12:32:07 PM By: [name] Maudlin MD FACS ?Signed: 10/28/2021 1:23:58 PM By: [name] Catholic RN ?Entered By: [name] on 10/28/2021 11:47:53 ?-------------------------------------------------------------------------------- ?HPI Details ?Patient Name: ?Date of Service: ?[name], [name] 10/28/2021 11:00 A M ?Medical Record Number: [id number] ?Patient Account Number: [id number] ?Date of Birth/Sex: ?Treating RN: ?02-Feb-1976 (46 y.o. [name]) [name] ?Primary Care Provider: Cristie [name] Other Clinician: ?Referring Provider: ?Treating Provider/Extender: [name] ?[name] ?Weeks in Treatment: 45 ?History of Present Illness ?HPI Description: Wounds are12/03/2020 upon evaluation today patient presents for initial inspection here  in our clinic concerning issues he has been having ?with the bottoms of his feet bilaterally. He states these actually occurred as wounds when he was hospitalized for 5 months secondary to Covid. He was ?apparently with tilting bed where he was in an upright position quite frequently and apparently this occurred in some way shape or form during that time. ?Fortunately there is no sign of active infection at this time. No fevers, chills, nausea, vomiting, or diarrhea. With that being said he still has substantial wounds ?on the plantar aspects of his feet Theragen require quite a bit of work to get these to heal. He has been using Santyl currently though that is been problematic ?both in receiving the medication as well as actually paid for it as it is become quite expensive. Prior to the experience with Covid the patient really did not have ?any major medical problems other than hypertension he does have some mild generalized weakness following the Covid experience. ?07/22/2020 on evaluation today patient appears to be doing okay in regard to his foot ulcers I feel like the wound beds are showing signs of better ?improvement that I do believe the Iodoflex is helping in this  regard. With that being said he does have a lot of drainage currently and this is somewhat ?blue/green in nature which is consistent with Pseudomonas. I do think a culture today would be appropriate for [place] to evaluate and see if that is indeed the case ?I would likely start him on antibiotic orally as well he is not allergic to Cipro knows of no issues he has had in the past ?12/21; patient was admitted to the clinic earlier this month with bilateral presumed pressure ulcers on the bottom of his feet apparently related to excessive ?pressure from a tilt table arrangement in the intensive care unit. Patient relates this to being on ECMO but I am not really sure that is exactly related to that. I ?must say I have never seen anything like this. He has fairly extensive full-thickness wounds extending from his heel towards his midfoot mostly centered ?laterally. There is already been some healing distally. He does not appear to have an arterial issue. He has been using gentamicin to the wound surfaces with ?Iodoflex to help with ongoing debridement ?1/6; this is a patient with pressure ulcers on the bottom of his feet related to excessive pressure from a standing position in the intensive care unit. He is ?complaining of a lot of pain in the right heel. He is not a diabetic. 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 We have been using ?Iodoflex to help prepare the surfaces of both wounds for an advanced treatment product. He is nonambulatory spending most of his time in a wheelchair I ?have asked him not to propel the wheelchair with his heels ?1/13; in general his wounds look better not much surface area change we have been using Iodoflex as of last week. ?I did an x-ray of the right heel as the patient was complaining of pain. I had some thoughts about a stress fracture perhaps Achilles tendon problems however ?what it showed was erosive changes along the inferior aspect of the calcaneus he now  has a MRI booked for 1/20. ?1/20; in general his wounds continue to be better. Some improvement in the large narrow areas proximally in his foot. He is still complaining of pain in the right ?heel and t

## 2021-10-29 IMAGING — DX DG CHEST 1V PORT
1 series · 1 of 1 positions shown · non-contrast
Comparison: Two days ago

CLINICAL DATA: Pneumothorax and chest tube

EXAM:
PORTABLE CHEST 1 VIEW

[chest ap]
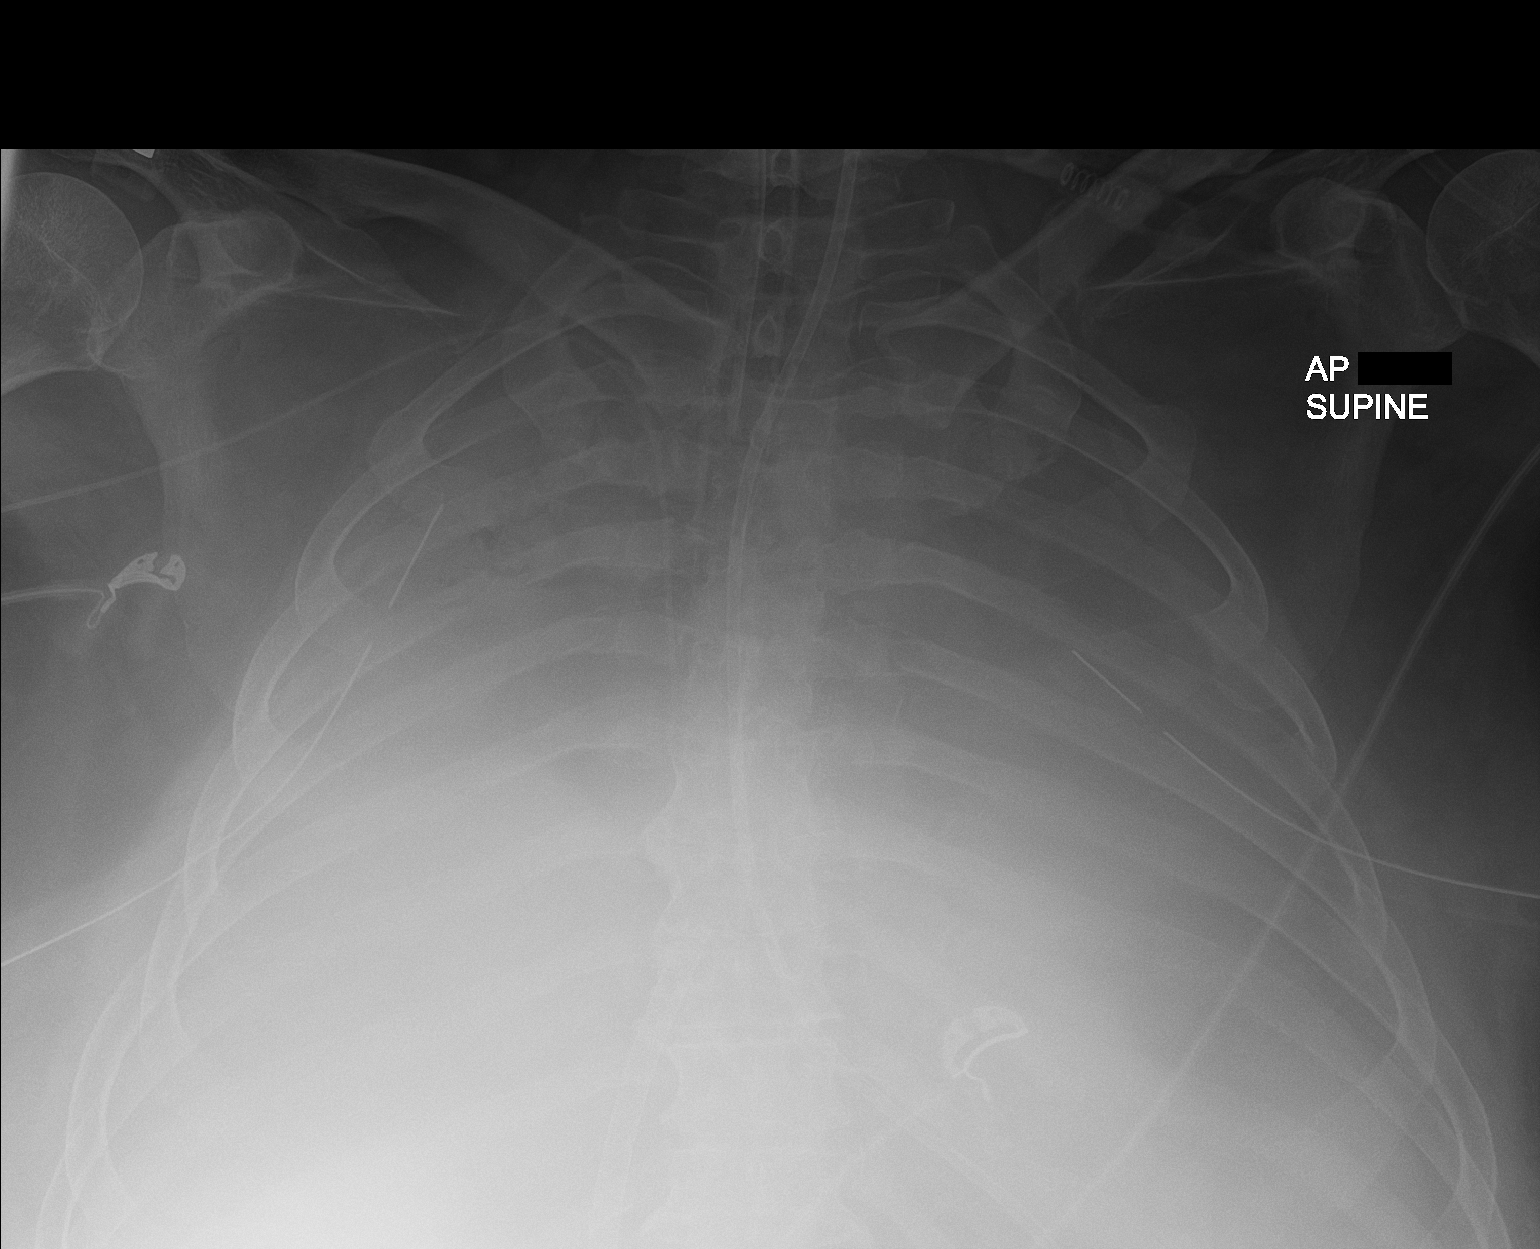

[1 of 1 positions shown; findings below may reference images not displayed]

FINDINGS: Bilateral chest tube in place with no visible pneumothorax. Right
PICC with tip at the SVC. Tracheostomy tube in place. Feeding tube
which at least reaches the stomach. Large bore cannula from below
with tip near the diaphragm. Lungs have become airless again.
Obscured heart.
IMPRESSION: 1. Unremarkable hardware positioning.
2. No visible pneumothorax.  The lungs have become airless.

## 2021-10-30 IMAGING — DX DG CHEST 1V PORT
1 series · 1 of 1 positions shown · non-contrast
Comparison: August 19, 2019

CLINICAL DATA: Hypoxia

EXAM:
PORTABLE CHEST 1 VIEW

[chest ap]
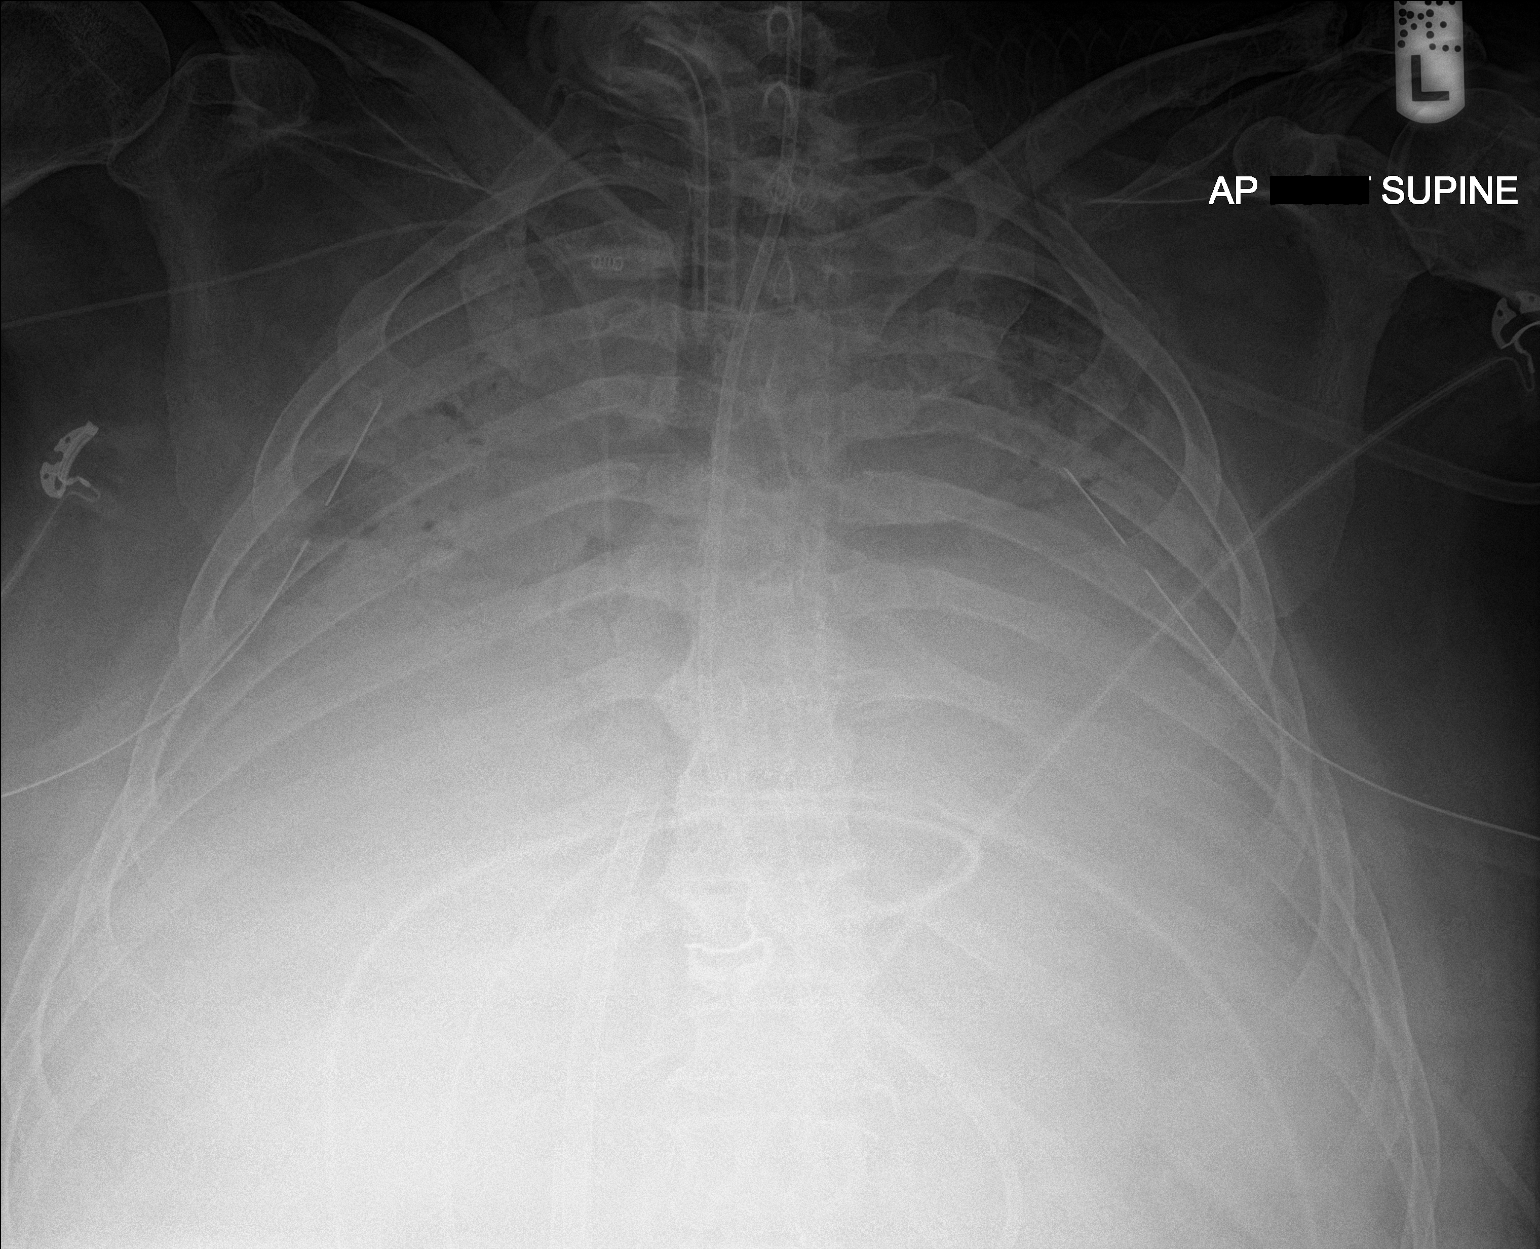

[1 of 1 positions shown; findings below may reference images not displayed]

FINDINGS: Tracheostomy catheter tip is 3.1 cm above the carina. Feeding tube
tip is below the diaphragm. There is a chest tube on each side,
unchanged in position. Central catheter tip is in the superior vena
cava. Large bore cannula seen to the right of midline in the upper
abdomen with tip near inferior vena cava-right atrium junction,
stable. No pneumothorax evident. There is widespread airspace
consolidation throughout both lungs with bilateral pleural
effusions. Heart appears enlarged. The pulmonary vascularity is
obscured by consolidation. No bone lesions are appreciable. The
airspace opacity obscures the hila and mediastinum sufficiently to
exclude assessment for potential adenopathy by radiography.
IMPRESSION: Tube and catheter positions as described without evident
pneumothorax. Widespread airspace consolidation with pleural
effusions bilaterally. There is diffuse opacification of most lung
regions bilaterally. Heart appears prominent with cardiac borders
obscured by effusion and consolidation. Appearance similar to 1 day
prior.

## 2021-10-30 IMAGING — DX DG CHEST 1V PORT
1 series · 1 of 1 positions shown · non-contrast
Comparison: 9777 hours today and earlier.

CLINICAL DATA: 43-year-old male positive [id number].  ARDS with ECMO.

EXAM:
PORTABLE CHEST 1 VIEW

[chest]
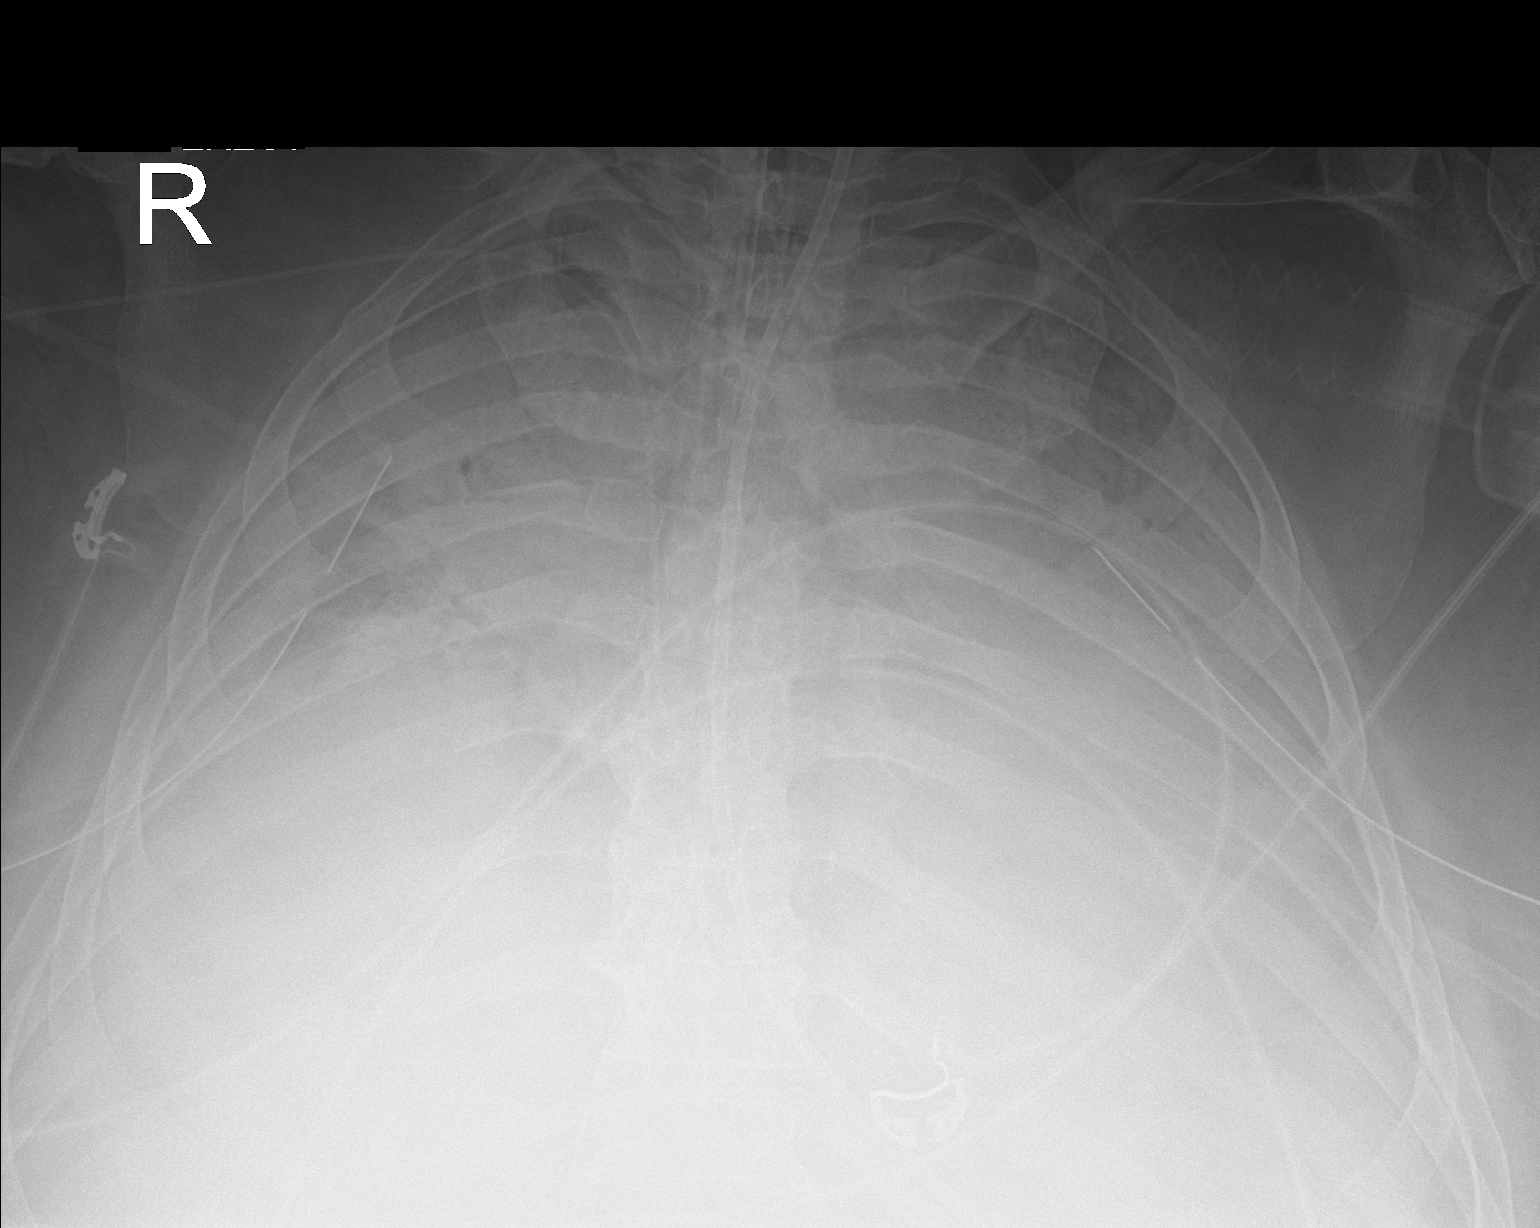

[1 of 1 positions shown; findings below may reference images not displayed]

FINDINGS: Portable AP semi upright view at 4055 hours. Stable tracheostomy
tube. Feeding tube courses to the abdomen, tip not included. Stable
bilateral chest tubes. Stable right PICC line.

Diffuse bilateral lung opacification with only minimal aeration.
Obscured mediastinal contours, diaphragm. No pneumothorax
identified. No acute osseous abnormality identified.
IMPRESSION: 1.  Stable lines and tubes.
2. Unchanged subtotal lung opacification, minimally increased
ventilation since 08/19/2019.

## 2021-10-31 IMAGING — DX DG CHEST 1V PORT
1 series · 1 of 1 positions shown · non-contrast
Comparison: Same day.

CLINICAL DATA: Hypoxia.

EXAM:
PORTABLE CHEST 1 VIEW

[chest ap]
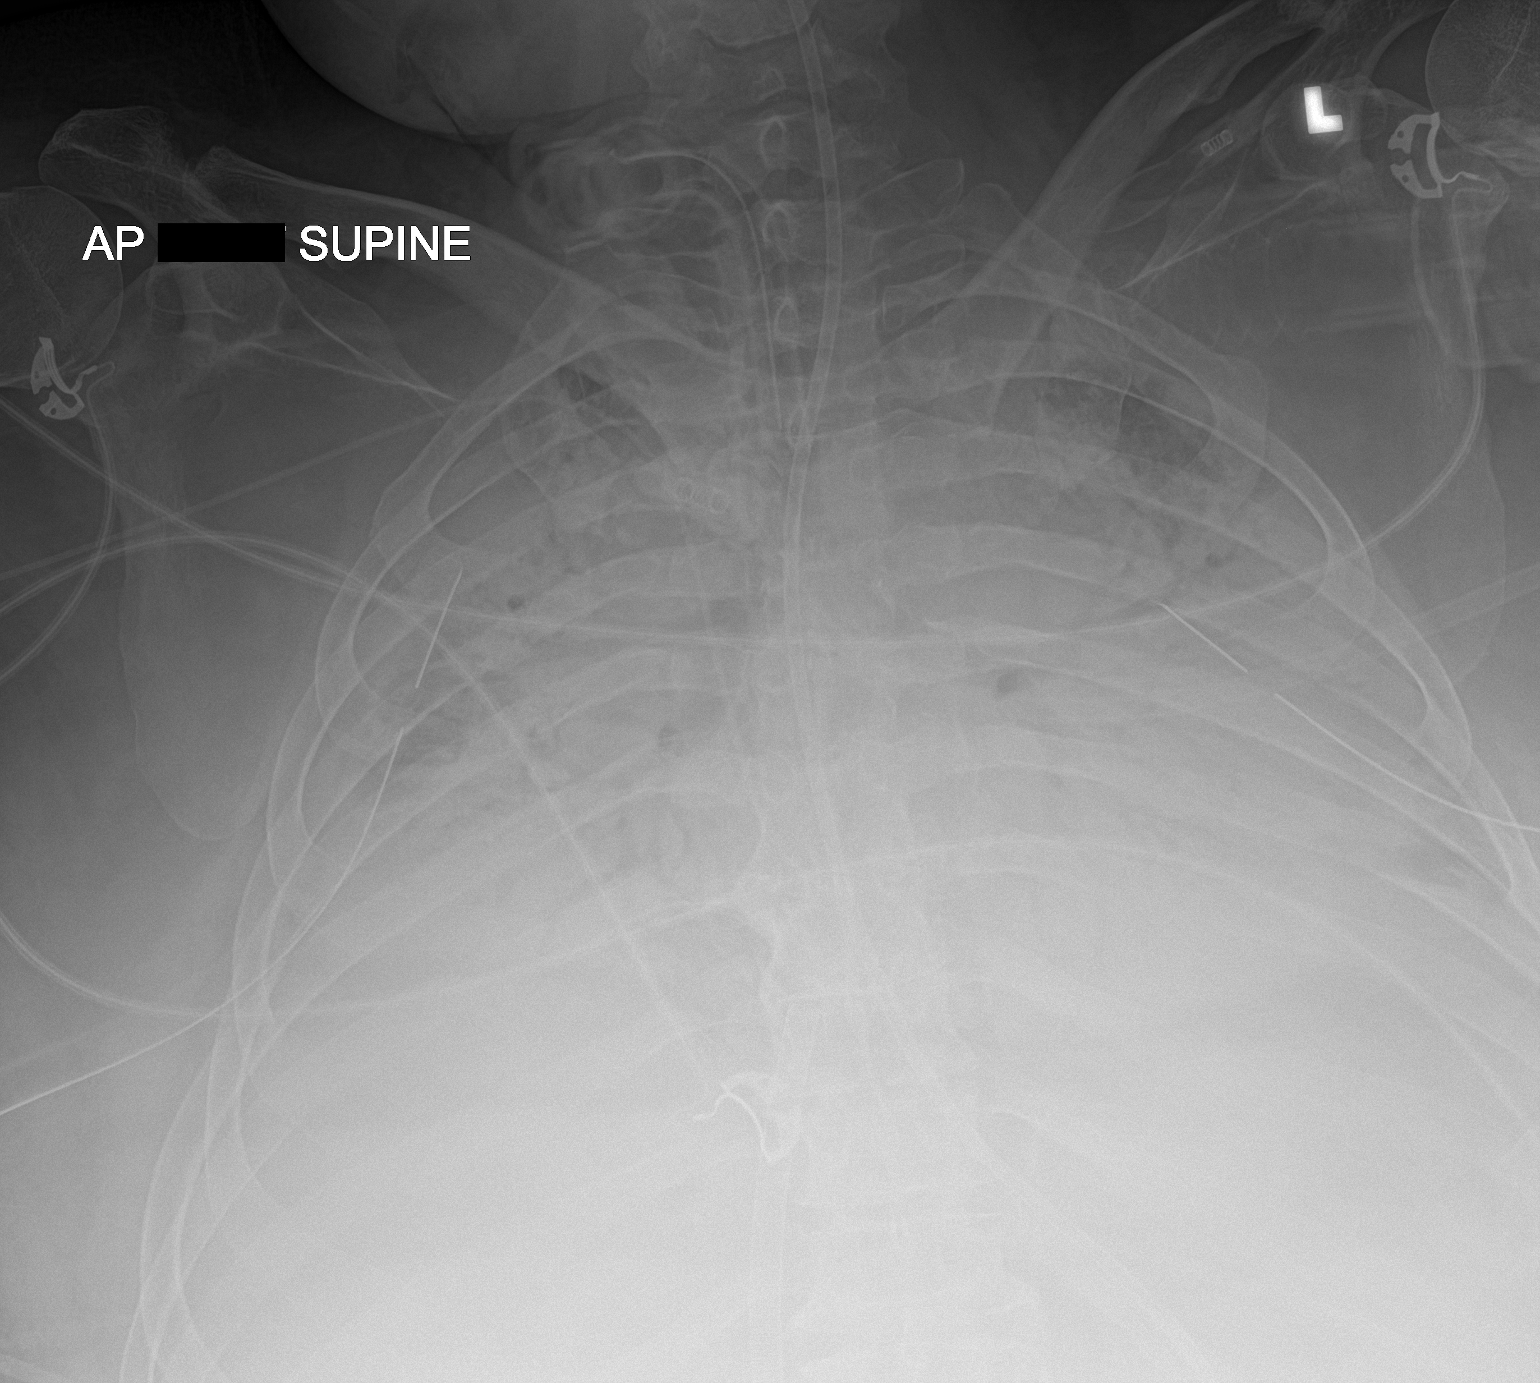

[1 of 1 positions shown; findings below may reference images not displayed]

FINDINGS: Tracheostomy and feeding tubes are unchanged in position. Bilateral
chest tubes are noted without pneumothorax. Right-sided PICC line is
unchanged. Stable diffuse opacification is seen involving both lungs
most consistent with pneumonia. Some degree of pleural effusion
cannot be excluded. Bony thorax is unremarkable.
IMPRESSION: Stable support apparatus. Stable bilateral chest tubes without
pneumothorax. Stable diffuse opacification is seen involving both
lungs most consistent with pneumonia. Some degree of pleural
effusion cannot be excluded.

## 2021-10-31 IMAGING — DX DG CHEST 1V
1 series · 1 of 1 positions shown · non-contrast
Comparison: Yesterday

CLINICAL DATA: COVID with respiratory distress

EXAM:
CHEST  1 VIEW

[chest]
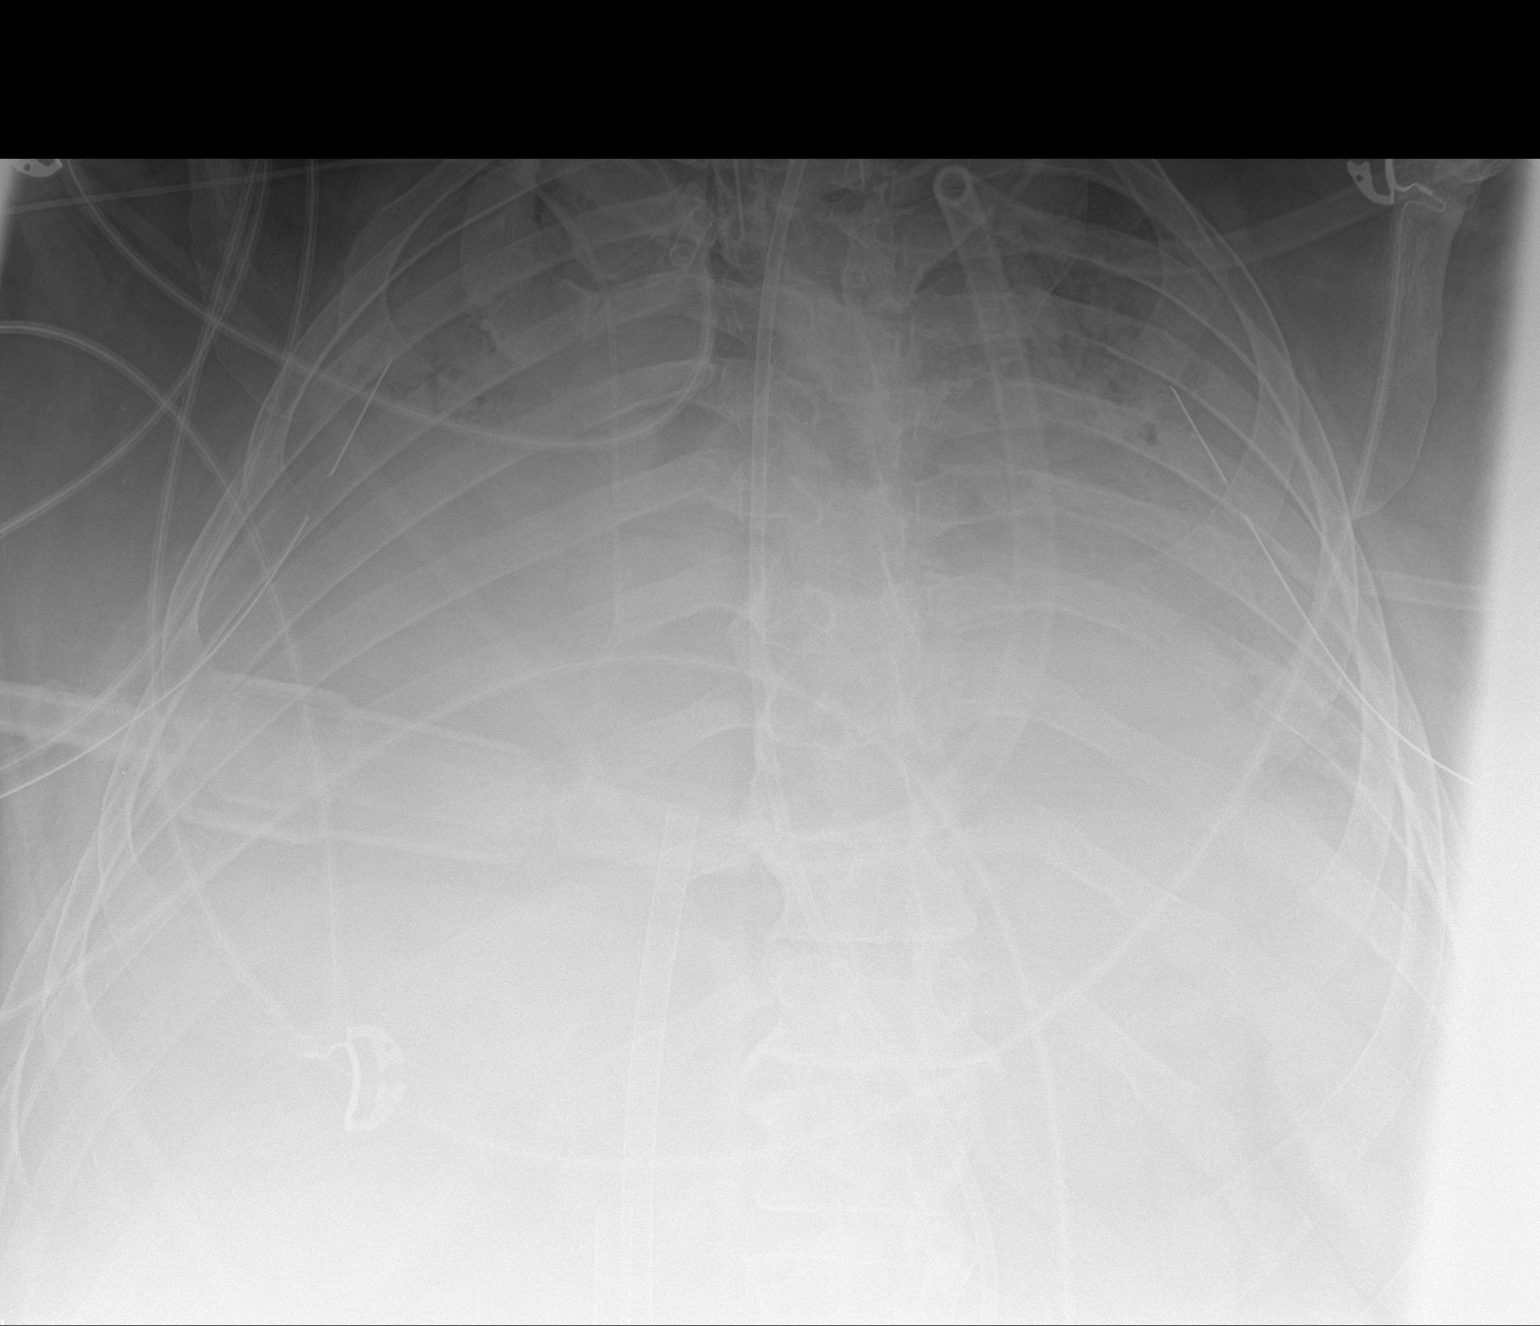

[1 of 1 positions shown; findings below may reference images not displayed]

FINDINGS: Tracheostomy tube in place. Feeding tube which at least reaches the
stomach. Bilateral chest tube and large bore IVC cannula. Right PICC
with tip at the expected upper cavoatrial junction. Nearly airless
bilateral lungs, unchanged. No visible pneumothorax. The heart is
obscured.
IMPRESSION: 1. Stable hardware positioning
2. Unchanged nearly airless lungs.

## 2021-11-01 IMAGING — DX DG CHEST 1V PORT
2 series · 2 of 2 positions shown · non-contrast
Comparison: Multiple previous chest x-rays.

CLINICAL DATA: Respiratory failure.

EXAM:
PORTABLE CHEST 1 VIEW

[chest ap (1 of 2)]
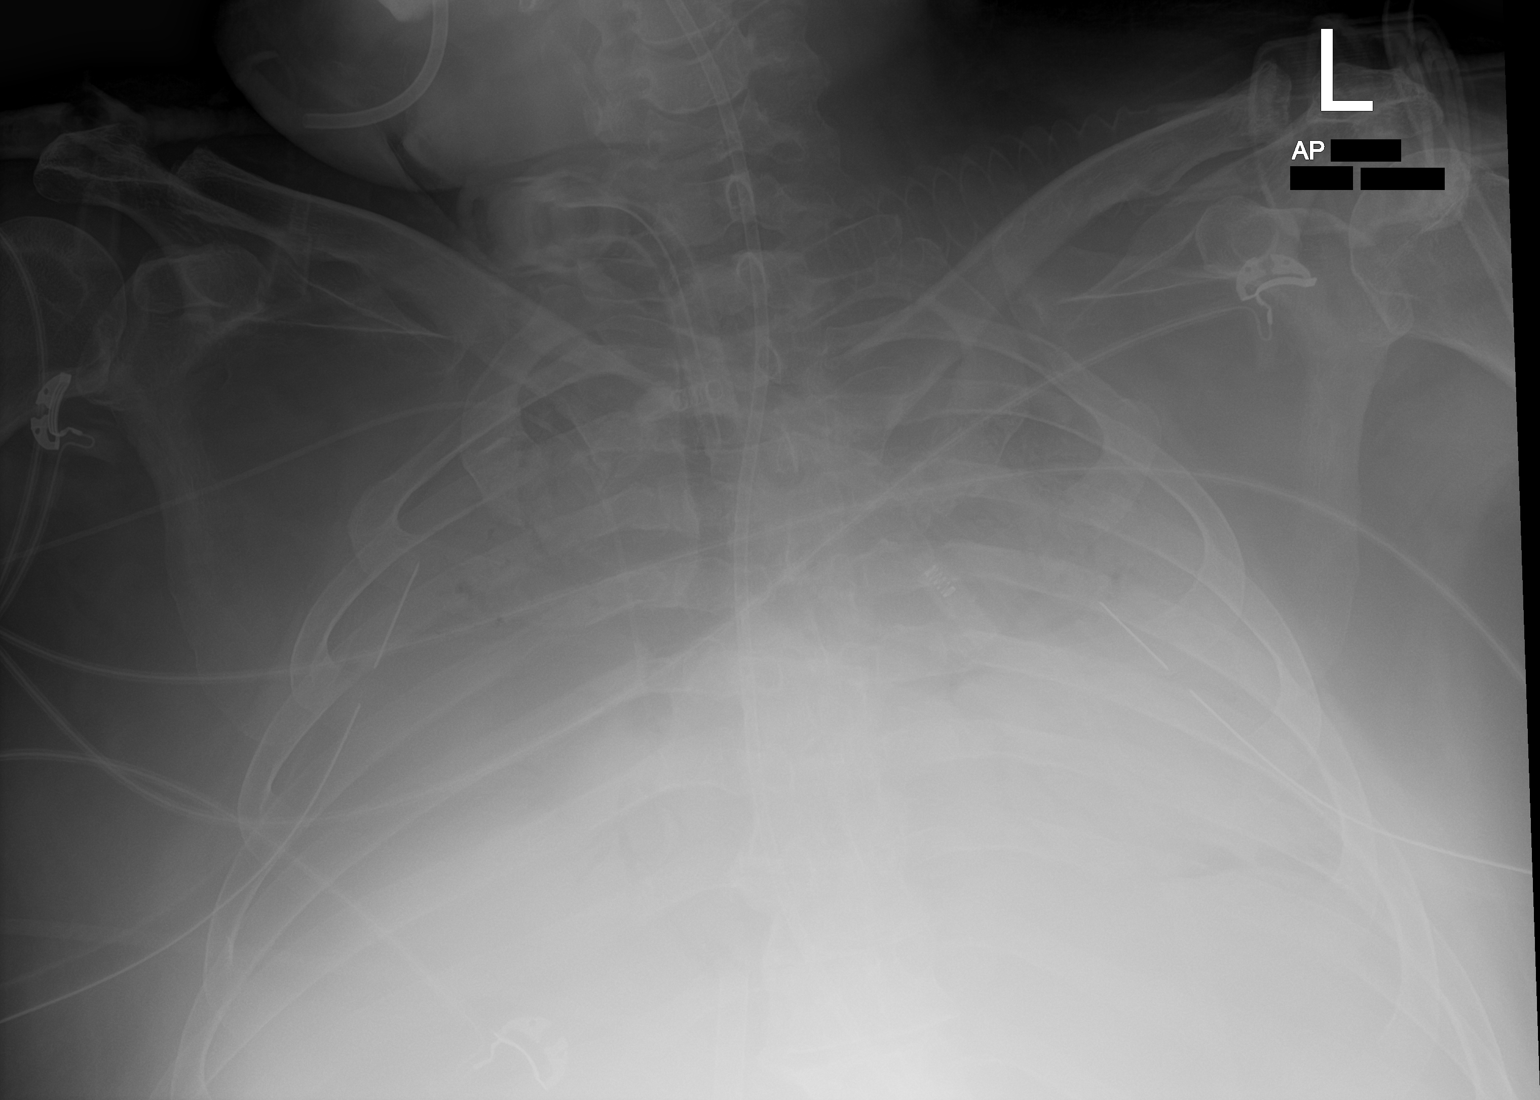

[chest ap (2 of 2)]
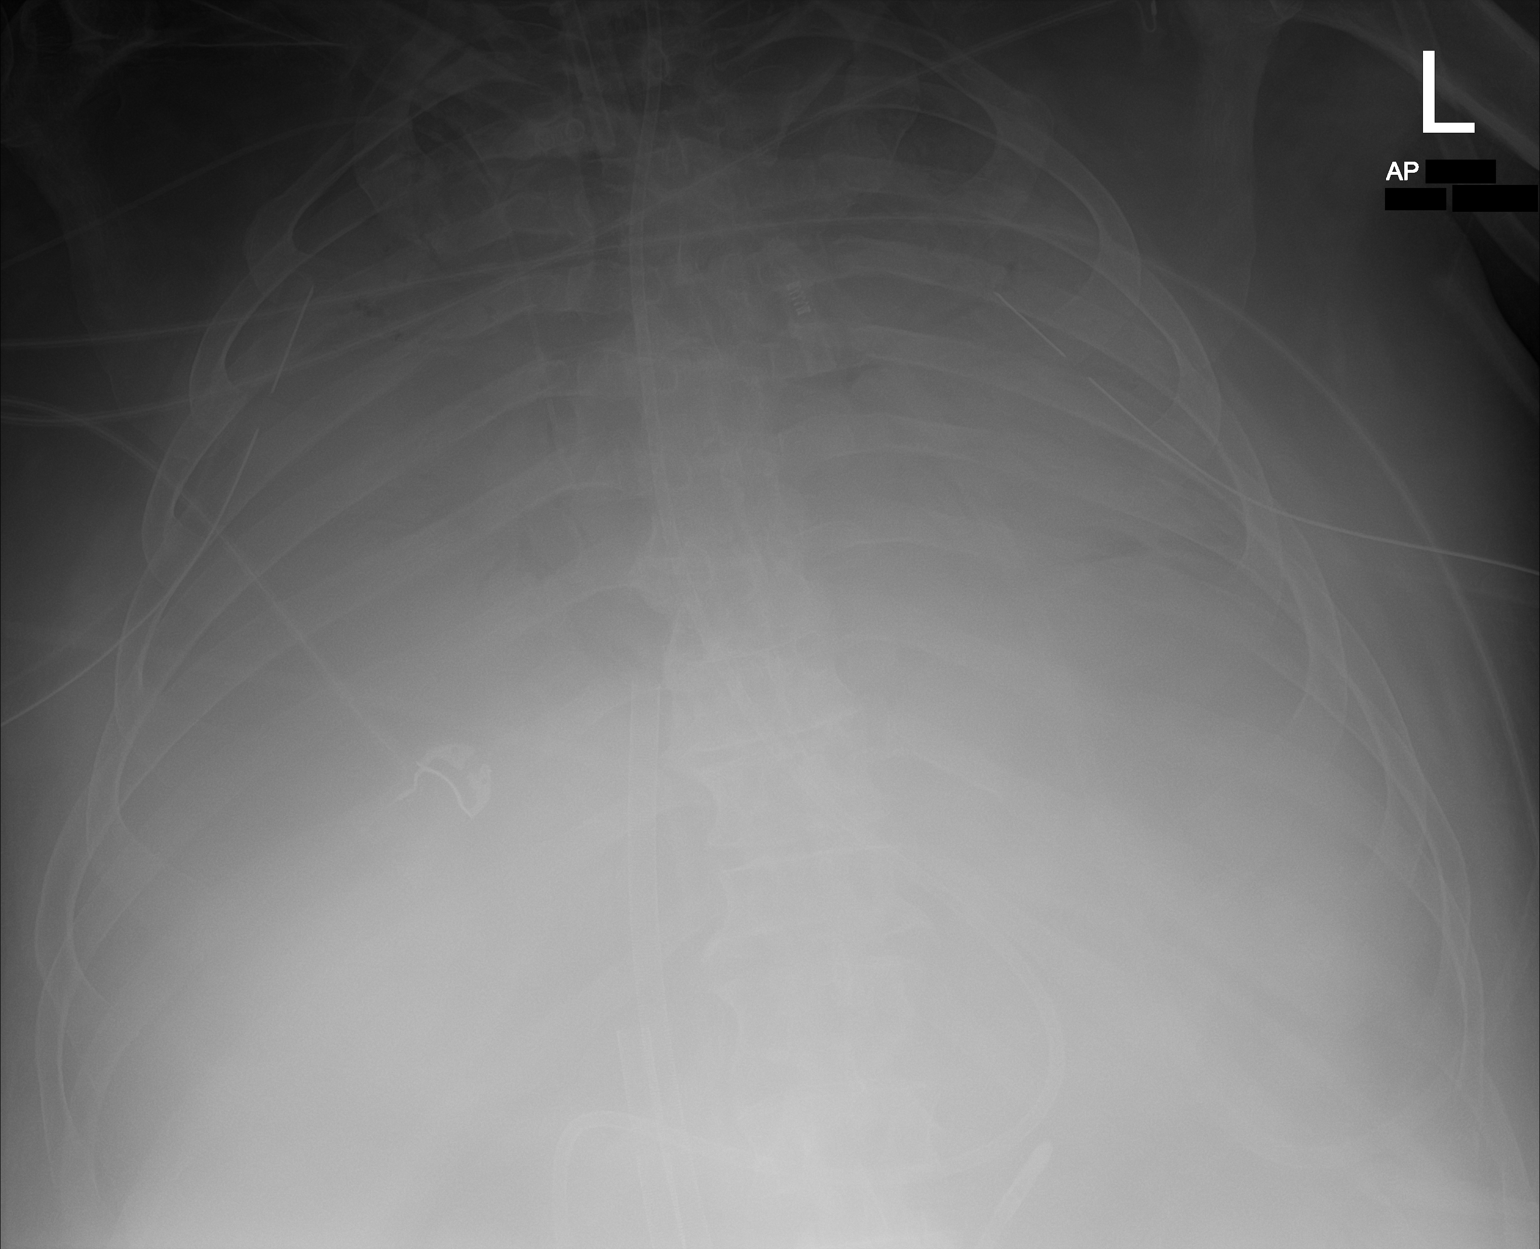

[2 of 2 positions shown; findings below may reference images not displayed]

FINDINGS: Tracheostomy tube is in good position at the mid tracheal level.

The feeding tube is coursing down the esophagus and into the
stomach.

Right PICC line tip is stable in the distal SVC.

Bilateral chest tubes are stable.  No pneumothorax.

Complete airspace consolidation of both lungs persists with air
bronchograms. No obvious pleural effusions.
IMPRESSION: 1. Stable support apparatus.
2. Persistent diffuse airspace consolidation in both lungs.

## 2021-11-01 IMAGING — DX DG CHEST 1V PORT
1 series · 1 of 1 positions shown · non-contrast
Comparison: August 21, 2019.

CLINICAL DATA: Acute respiratory distress syndrome.

EXAM:
PORTABLE CHEST 1 VIEW

[chest ap]
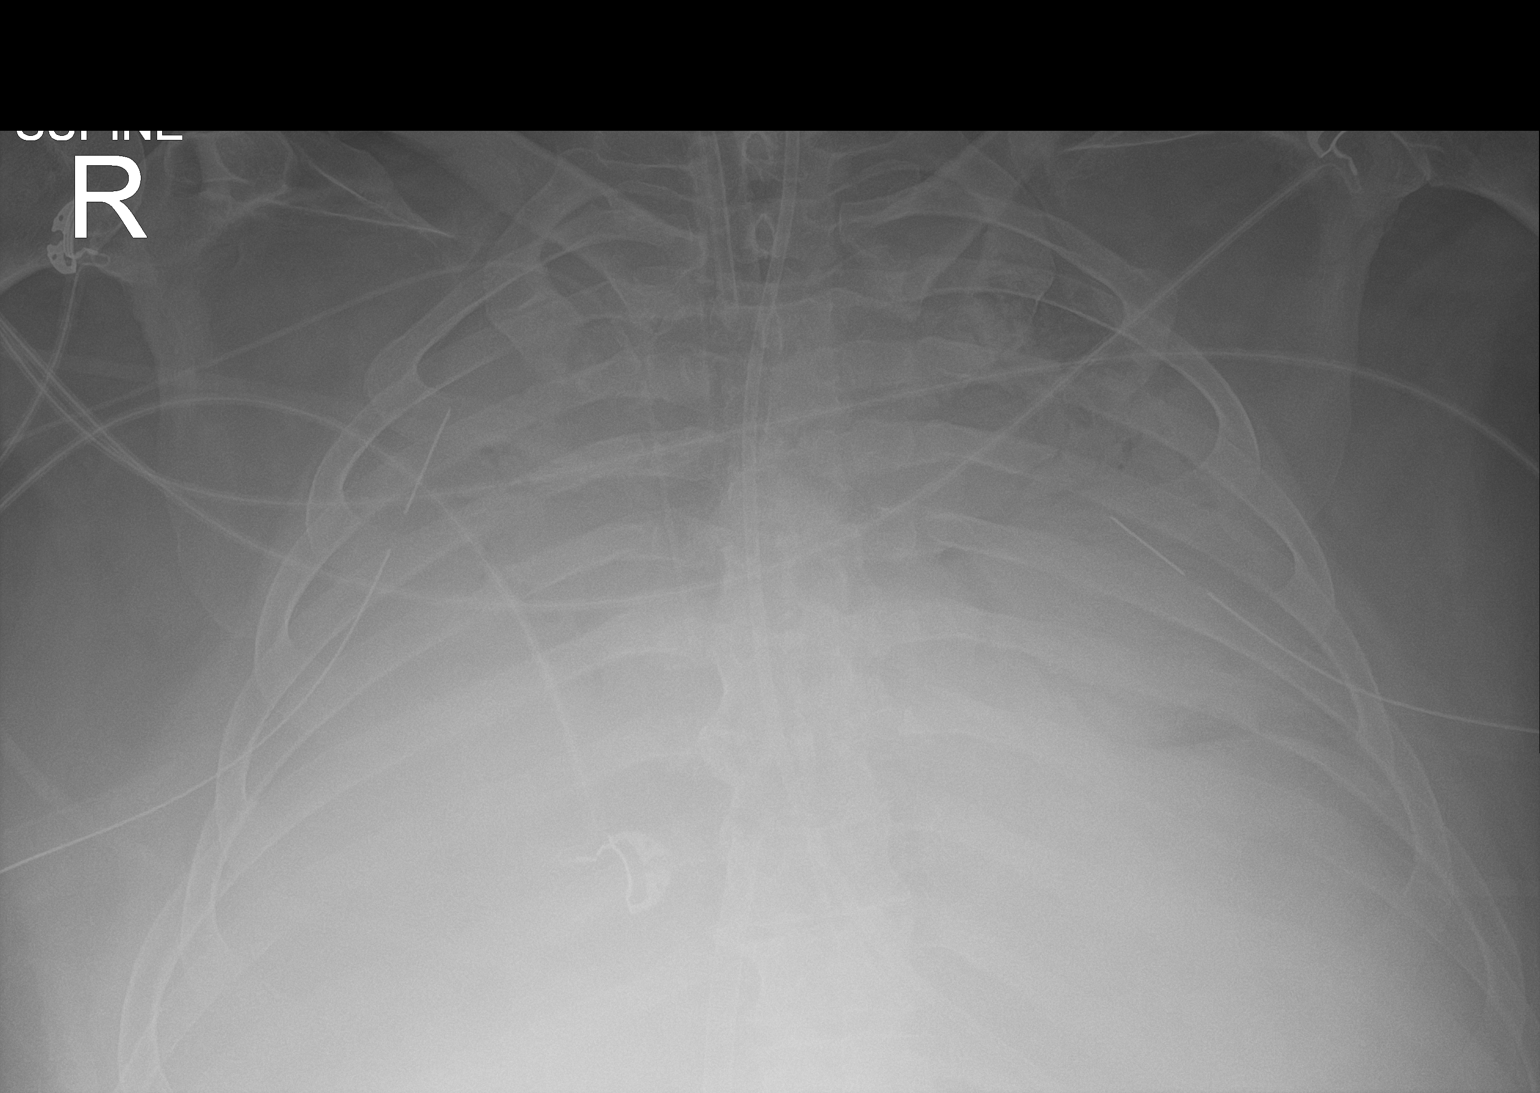

[1 of 1 positions shown; findings below may reference images not displayed]

FINDINGS: Tracheostomy and feeding tubes are unchanged in position. Bilateral
chest tubes are noted without pneumothorax. Stable diffuse
opacification of both lungs is noted most consistent with pneumonia.
No pneumothorax is noted. Bony thorax is unremarkable.
IMPRESSION: Stable bilateral chest tubes without evidence of pneumothorax.
Stable diffuse opacification of both lungs is noted most consistent
with pneumonia.

## 2021-11-03 IMAGING — DX DG CHEST 1V PORT
1 series · 1 of 1 positions shown · non-contrast
Comparison: One-view chest x-ray 08/23/2019

CLINICAL DATA: Cardiovascular failure.

EXAM:
PORTABLE CHEST 1 VIEW

[chest ap]
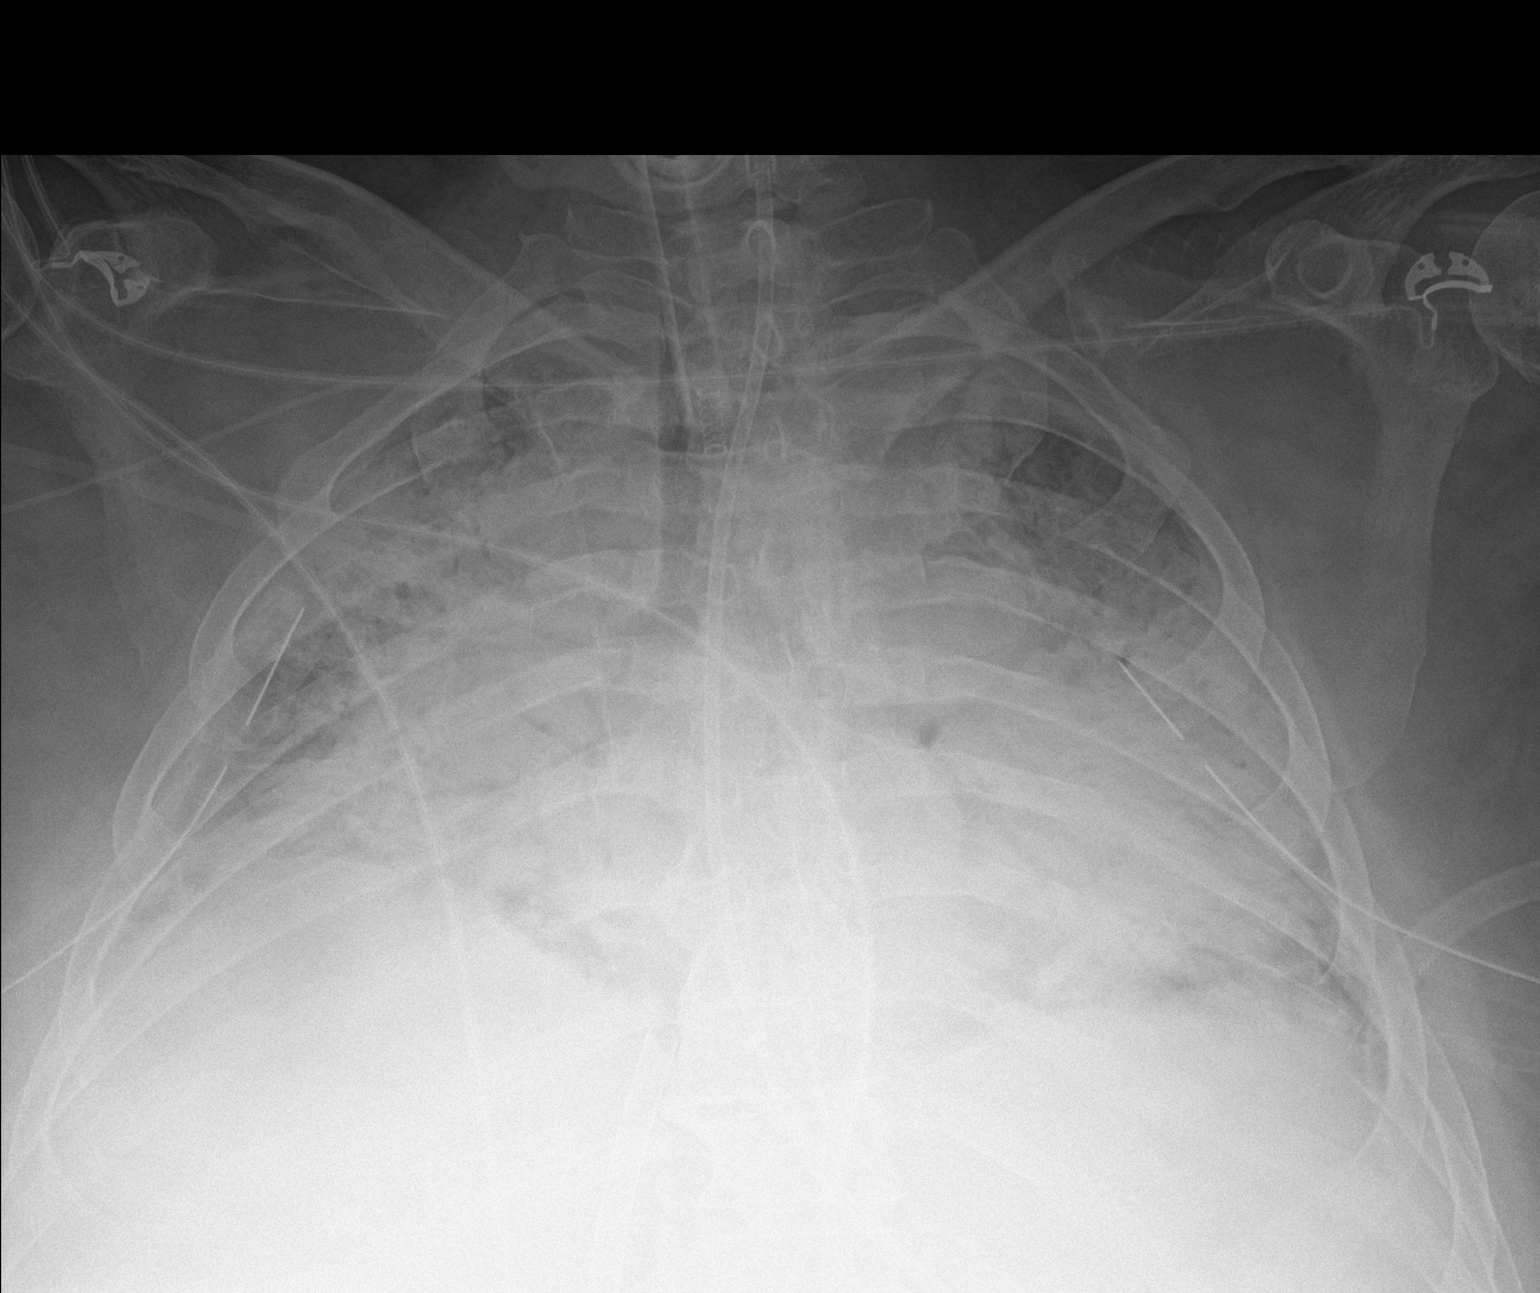

[1 of 1 positions shown; findings below may reference images not displayed]

FINDINGS: Heart size is obscured by diffuse airspace disease. Bilateral chest
tubes are in place. There is no pneumothorax. Tracheostomy tube is
stable. Feeding tube courses off the inferior border of film.
Right-sided PICC line is stable. Aeration at the periphery of the
lung fields is slightly improved.
IMPRESSION: 1. Slightly improved aeration at the periphery of the lung fields
bilaterally.
2. Diffuse interstitial and airspace opacities are again seen.
3. Support apparatus is stable.

## 2021-11-04 ENCOUNTER — Encounter (HOSPITAL_BASED_OUTPATIENT_CLINIC_OR_DEPARTMENT_OTHER): Payer: BC Managed Care – PPO | Admitting: General Surgery

## 2021-11-04 DIAGNOSIS — I10 Essential (primary) hypertension: Secondary | ICD-10-CM | POA: Diagnosis not present

## 2021-11-04 DIAGNOSIS — Z8616 Personal history of COVID-19: Secondary | ICD-10-CM | POA: Diagnosis not present

## 2021-11-04 DIAGNOSIS — L97522 Non-pressure chronic ulcer of other part of left foot with fat layer exposed: Secondary | ICD-10-CM | POA: Diagnosis not present

## 2021-11-04 DIAGNOSIS — L97512 Non-pressure chronic ulcer of other part of right foot with fat layer exposed: Secondary | ICD-10-CM | POA: Diagnosis not present

## 2021-11-04 IMAGING — DX DG CHEST 1V PORT
1 series · 1 of 1 positions shown · non-contrast
Comparison: One-view chest x-ray 08/24/2019

CLINICAL DATA: Acute respiratory failure with hypoxia. ECMO.

EXAM:
PORTABLE CHEST 1 VIEW

[chest]
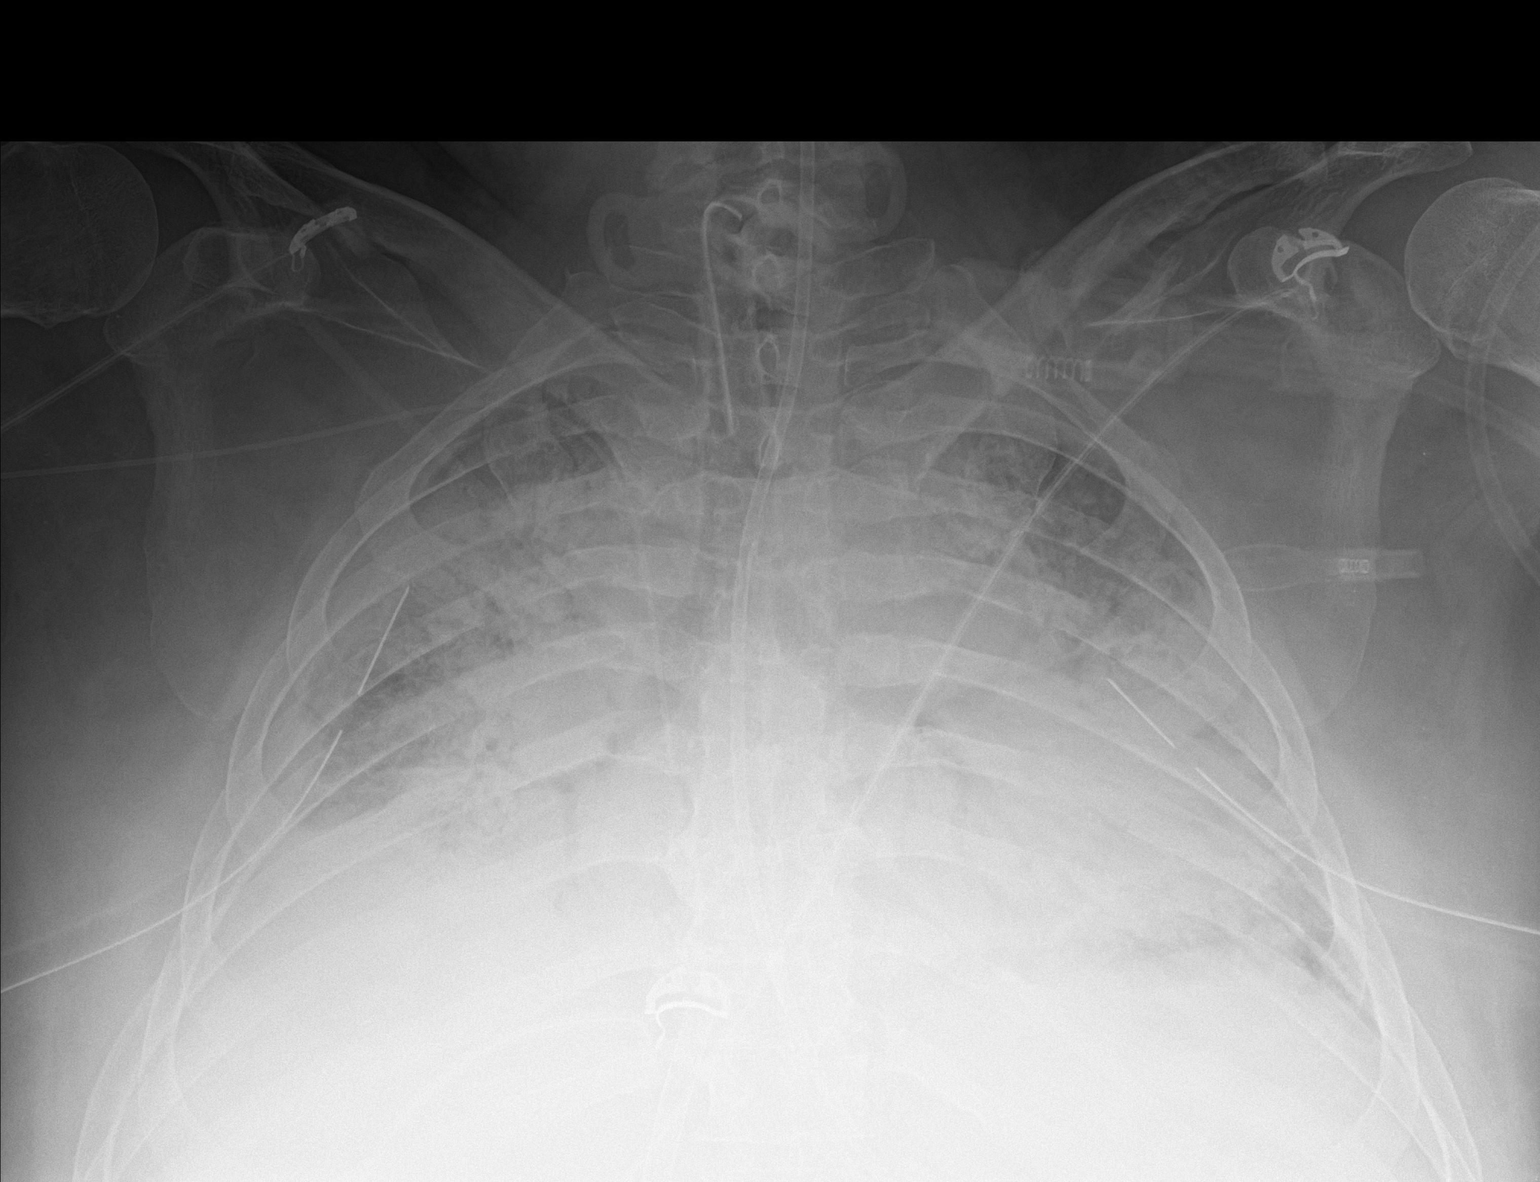

[1 of 1 positions shown; findings below may reference images not displayed]

FINDINGS: Tracheostomy tube and bilateral chest tubes are stable. Right-sided
PICC line is in place. Feeding tube courses off the inferior border
of the film.

The heart is enlarged. Diffuse interstitial and airspace disease is
similar the prior study.
IMPRESSION: 1. Stable appearance of diffuse interstitial and airspace disease
compatible with ARDS.
2. Stable support apparatus.

## 2021-11-04 IMAGING — DX DG CHEST 1V PORT
1 series · 1 of 1 positions shown · non-contrast
Comparison: Radiograph earlier this day

CLINICAL DATA: Hypoxia.

EXAM:
PORTABLE CHEST 1 VIEW

[chest ap]
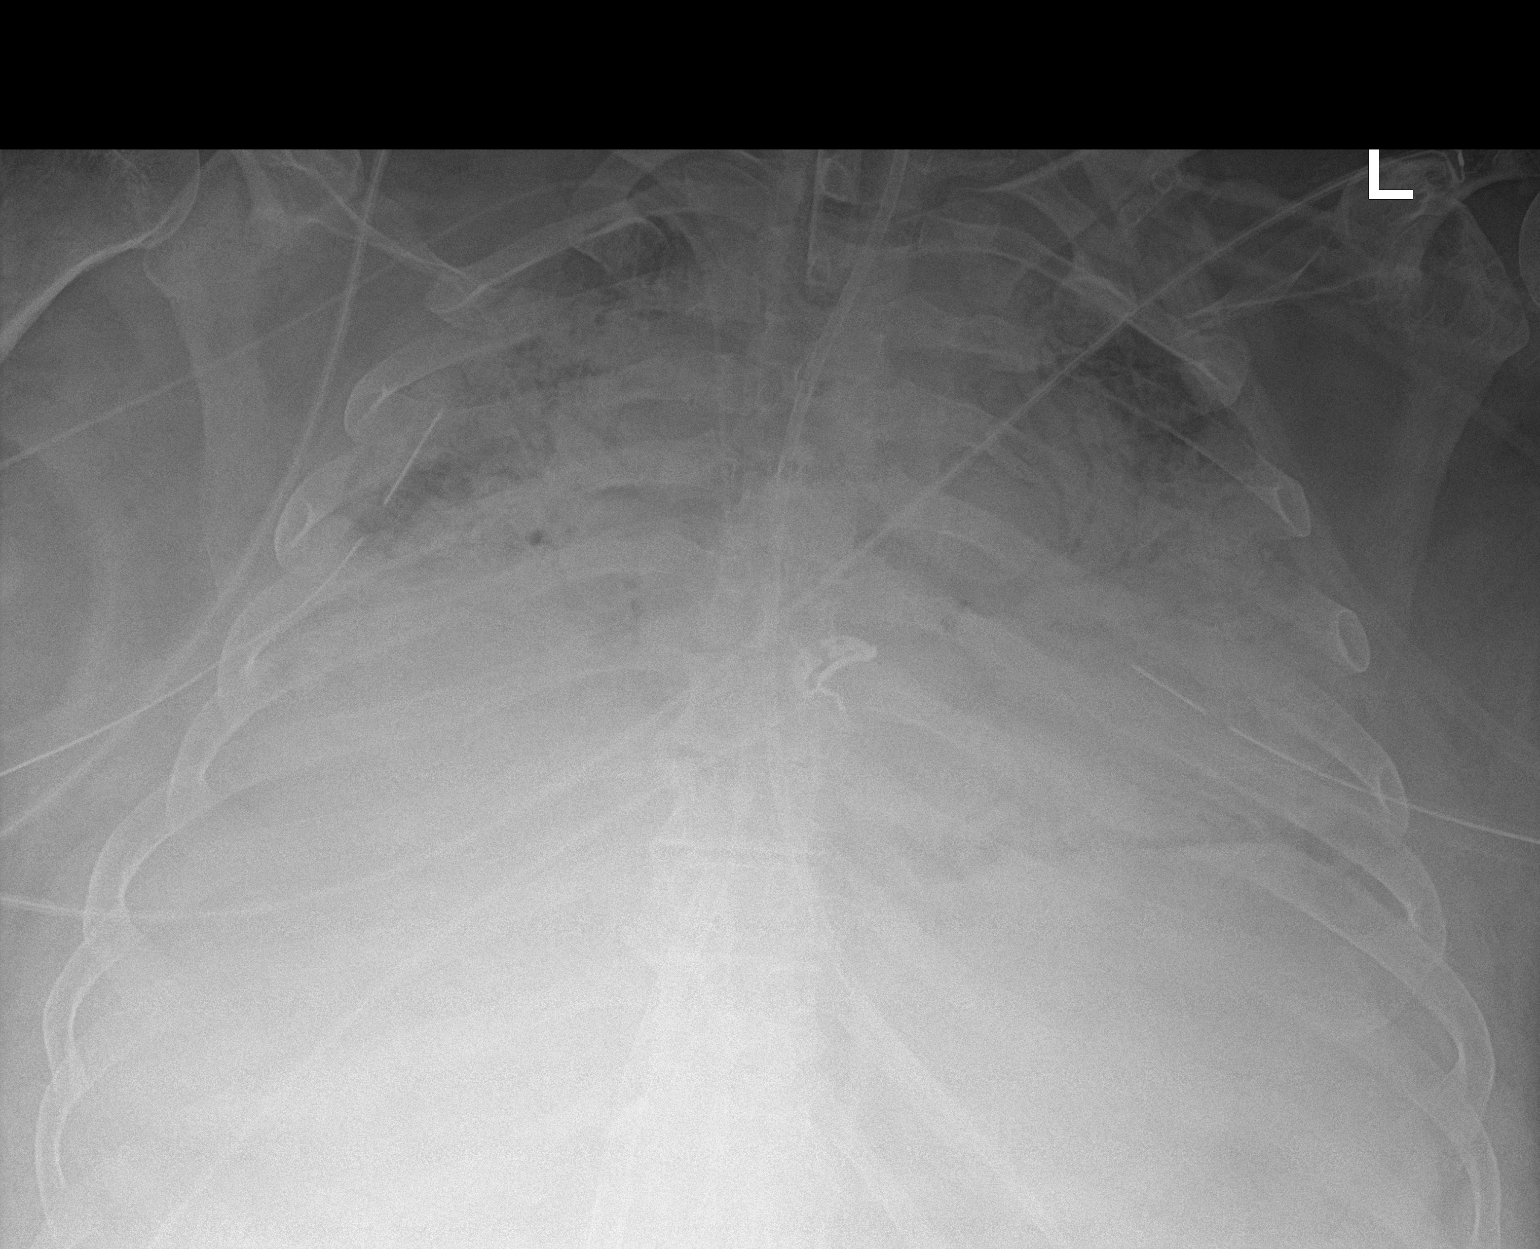

[1 of 1 positions shown; findings below may reference images not displayed]

FINDINGS: Endotracheal/tracheostomy tube tip at the thoracic inlet. Weighted
enteric tube in place with tip below the diaphragm. Right upper
extremity PICC in place. Bilateral chest tubes in place. ECMO
catheter from an inferior approach unchanged. Diffuse dense
bilateral airspace disease, equivocal worsening in the interim.
Central air bronchograms. No visualized pneumothorax.
IMPRESSION: 1. Diffuse dense bilateral airspace disease, equivocal worsening
since earlier this day.
2. Support apparatus unchanged.

## 2021-11-04 NOTE — Progress Notes (Signed)
[name], [name] ([id number]) ?Visit Report for 11/04/2021 ?Chief Complaint Document Details ?Patient Name: Date of Service: ?[name], [name] 11/04/2021 9:30 A M ?Medical Record Number: [id number] ?Patient Account Number: [id number] ?Date of Birth/Sex: Treating RN: ?March 04, 1976 (46 y.o. [name]) [name] ?Primary Care Provider: [name] Other Clinician: ?Referring Provider: ?Treating Provider/Extender: Fredirick Maudlin ?[name] ?Weeks in Treatment: 32 ?Information Obtained from: Patient ?Chief Complaint ?Bilateral Plantar Foot Ulcers ?Electronic Signature(s) ?Signed: 11/04/2021 11:27:30 AM By: Fredirick Maudlin MD FACS ?Entered By: Fredirick Maudlin on 11/04/2021 11:27:30 ?-------------------------------------------------------------------------------- ?Debridement Details ?Patient Name: Date of Service: ?[name], [name] 11/04/2021 9:30 A M ?Medical Record Number: [id number] ?Patient Account Number: [id number] ?Date of Birth/Sex: Treating RN: ?[birth year] (46 y.o. [name]) [name] ?Primary Care Provider: [name] Other Clinician: ?Referring Provider: ?Treating Provider/Extender: Fredirick Maudlin ?[name] ?Weeks in Treatment: 7 ?Debridement Performed for Assessment: Wound #1 Right Calcaneus ?Performed By: Physician Fredirick Maudlin, MD ?Debridement Type: Debridement ?Level of Consciousness (Pre-procedure): Awake and Alert ?Pre-procedure Verification/Time Out Yes - 09:54 ?Taken: ?Start Time: 09:54 ?Pain Control: Lidocaine 5% topical ointment ?T Area Debrided (L x W): ?otal 2 (cm) x 1.7 (cm) = 3.4 (cm?) ?Tissue and other material debrided: Non-Viable, Callus, Slough, [place] ?Level: Non-Viable Tissue ?Debridement Description: Selective/Open Wound ?Instrument: Curette ?Bleeding: Minimum ?Hemostasis Achieved: Pressure ?End Time: 09:55 ?Procedural Pain: 0 ?Post Procedural Pain: 0 ?Response to Treatment: Procedure was tolerated well ?Level of Consciousness (Post- Awake and Alert ?procedure): ?Post Debridement  Measurements of Total Wound ?Length: (cm) 2 ?Stage: Category/Stage III ?Width: (cm) 1.7 ?Depth: (cm) 0.2 ?Volume: (cm?) 0.534 ?Character of Wound/Ulcer Post Debridement: Improved ?Post Procedure Diagnosis ?Same as Pre-procedure ?Electronic Signature(s) ?Signed: 11/04/2021 12:11:01 PM By: [name] ?Signed: 11/04/2021 5:31:11 PM By: Fredirick Maudlin MD FACS ?Entered By: [name] on 11/04/2021 09:58:23 ?-------------------------------------------------------------------------------- ?Debridement Details ?Patient Name: ?Date of Service: ?[name], [name] 11/04/2021 9:30 A M ?Medical Record Number: [id number] ?Patient Account Number: [id number] ?Date of Birth/Sex: ?Treating RN: ?09/13/1975 (46 y.o. [name]) [name] ?Primary Care Provider: [name] ?Other Clinician: ?Referring Provider: ?Treating Provider/Extender: Fredirick Maudlin ?[name] ?Weeks in Treatment: 4 ?Debridement Performed for Assessment: Wound #2 Left Calcaneus ?Performed By: Physician Fredirick Maudlin, MD ?Debridement Type: Debridement ?Level of Consciousness (Pre-procedure): Awake and Alert ?Pre-procedure Verification/Time Out Yes - 09:54 ?Taken: ?Start Time: 09:54 ?Pain Control: Lidocaine 5% topical ointment ?T Area Debrided (L x W): ?otal 3.2 (cm) x 1.2 (cm) = 3.84 (cm?) ?Tissue and other material debrided: Non-Viable, Callus, Slough, Subcutaneous, Slough ?Level: Skin/Subcutaneous Tissue ?Debridement Description: Excisional ?Instrument: Curette ?Bleeding: Minimum ?Hemostasis Achieved: Pressure ?End Time: 09:55 ?Procedural Pain: 0 ?Post Procedural Pain: 0 ?Response to Treatment: Procedure was tolerated well ?Level of Consciousness (Post- Awake and Alert ?procedure): ?Post Debridement Measurements of Total Wound ?Length: (cm) 3.2 ?Stage: Category/Stage III ?Width: (cm) 1.2 ?Depth: (cm) 0.4 ?Volume: (cm?) 1.206 ?Character of Wound/Ulcer Post Debridement: Improved ?Post Procedure Diagnosis ?Same as Pre-procedure ?Electronic  Signature(s) ?Signed: 11/04/2021 12:11:01 PM By: [name] ?Signed: 11/04/2021 5:31:11 PM By: Fredirick Maudlin MD FACS ?Entered By: [name] on 11/04/2021 10:06:48 ?-------------------------------------------------------------------------------- ?HPI Details ?Patient Name: ?Date of Service: ?[name], [name] 11/04/2021 9:30 A M ?Medical Record Number: [id number] ?Patient Account Number: [id number] ?Date of Birth/Sex: ?Treating RN: ?1976-05-28 (46 y.o. [name]) [name] ?Primary Care Provider: [name] Other Clinician: ?Referring Provider: ?Treating Provider/Extender: Fredirick Maudlin ?[name] ?Weeks in Treatment: 62 ?History of Present Illness ?HPI Description: Wounds are12/03/2020 upon evaluation today patient presents for initial inspection here  in our clinic concerning issues he has been having ?with the bottoms of his feet bilaterally. He states these actually occurred as wounds when he was hospitalized for 5 months secondary to Covid. He was ?apparently with tilting bed where he was in an upright position quite frequently and apparently this occurred in some way shape or form during that time. ?Fortunately there is no sign of active infection at this time. No fevers, chills, nausea, vomiting, or diarrhea. With that being said he still has substantial wounds ?on the plantar aspects of his feet Theragen require quite a bit of work to get these to heal. He has been using Santyl currently though that is been problematic ?both in receiving the medication as well as actually paid for it as it is become quite expensive. Prior to the experience with Covid the patient really did not have ?any major medical problems other than hypertension he does have some mild generalized weakness following the Covid experience. ?07/22/2020 on evaluation today patient appears to be doing okay in regard to his foot ulcers I feel like the wound beds are showing signs of better ?improvement that I do believe the  Iodoflex is helping in this regard. With that being said he does have a lot of drainage currently and this is somewhat ?blue/green in nature which is consistent with Pseudomonas. I do think a culture today would be appropriate for [place] to evaluate and see if that is indeed the case ?I would likely start him on antibiotic orally as well he is not allergic to Cipro knows of no issues he has had in the past ?12/21; patient was admitted to the clinic earlier this month with bilateral presumed pressure ulcers on the bottom of his feet apparently related to excessive ?pressure from a tilt table arrangement in the intensive care unit. Patient relates this to being on ECMO but I am not really sure that is exactly related to that. I ?must say I have never seen anything like this. He has fairly extensive full-thickness wounds extending from his heel towards his midfoot mostly centered ?laterally. There is already been some healing distally. He does not appear to have an arterial issue. He has been using gentamicin to the wound surfaces with ?Iodoflex to help with ongoing debridement ?1/6; this is a patient with pressure ulcers on the bottom of his feet related to excessive pressure from a standing position in the intensive care unit. He is ?complaining of a lot of pain in the right heel. He is not a diabetic. 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 We have been using ?Iodoflex to help prepare the surfaces of both wounds for an advanced treatment product. He is nonambulatory spending most of his time in a wheelchair I ?have asked him not to propel the wheelchair with his heels ?1/13; in general his wounds look better not much surface area change we have been using Iodoflex as of last week. ?I did an x-ray of the right heel as the patient was complaining of pain. I had some thoughts about a stress fracture perhaps Achilles tendon problems however ?what it showed was erosive changes along the inferior  aspect of the calcaneus he now has a MRI booked for 1/20. ?1/20; in general his wounds continue to be better. Some improvement in the large narrow areas proximally in his foot. He is still complaining of pain in the right ?hee

## 2021-11-04 NOTE — Progress Notes (Signed)
LEOPOLD, SMYERS ([id number]) ?Visit Report for 11/04/2021 ?Arrival Information Details ?Patient Name: Date of Service: ?[name], [name] 11/04/2021 9:30 A M ?Medical Record Number: [id number] ?Patient Account Number: [id number] ?Date of Birth/Sex: Treating RN: ?1976/06/17 (46 y.o. [name]) [name] ?Primary Care Jacey Pelc: [name] Other Clinician: ?Referring Milano Rosevear: ?Treating Valree Feild/Extender: Fredirick Maudlin ?[name] ?Weeks in Treatment: 53 ?Visit Information History Since Last Visit ?Added or deleted any medications: No ?Patient Arrived: Wheel Chair ?Any new allergies or adverse reactions: No ?Arrival Time: 09:26 ?Had a fall or experienced change in No ?Accompanied By: self ?activities of daily living that may affect ?Transfer Assistance: None ?risk of falls: ?Patient Identification Verified: Yes ?Signs or symptoms of abuse/neglect since last visito No ?Secondary Verification Process Completed: Yes ?Hospitalized since last visit: No ?Patient Requires Transmission-Based Precautions: No ?Implantable device outside of the clinic excluding No ?Patient Has Alerts: No ?cellular tissue based products placed in the center ?since last visit: ?Has Dressing in Place as Prescribed: Yes ?Pain Present Now: No ?Electronic Signature(s) ?Signed: 11/04/2021 12:11:01 PM By: [name] ?Entered By: [name] on 11/04/2021 09:38:45 ?-------------------------------------------------------------------------------- ?Encounter Discharge Information Details ?Patient Name: Date of Service: ?[name], [name] 11/04/2021 9:30 A M ?Medical Record Number: [id number] ?Patient Account Number: [id number] ?Date of Birth/Sex: Treating RN: ?Jan 31, 1976 (46 y.o. [name]) [name] ?Primary Care Angeliah Wisdom: [name] Other Clinician: ?Referring Alese Furniss: ?Treating Delle Andrzejewski/Extender: Fredirick Maudlin ?[name] ?Weeks in Treatment: 54 ?Encounter Discharge Information Items Post Procedure Vitals ?Discharge Condition:  Stable ?Temperature (F): 98.4 ?Ambulatory Status: Wheelchair ?Pulse (bpm): 76 ?Discharge Destination: Home ?Respiratory Rate (breaths/min): 18 ?Transportation: Private Auto ?Blood Pressure (mmHg): 143/84 ?Accompanied By: spouse ?Schedule Follow-up Appointment: Yes ?Clinical Summary of Care: ?Electronic Signature(s) ?Signed: 11/04/2021 12:11:01 PM By: [name] ?Entered By: [name] on 11/04/2021 12:10:38 ?-------------------------------------------------------------------------------- ?Lower Extremity Assessment Details ?Patient Name: ?Date of Service: ?[name], [name] 11/04/2021 9:30 A M ?Medical Record Number: [id number] ?Patient Account Number: [id number] ?Date of Birth/Sex: ?Treating RN: ?August 28, 1975 (46 y.o. [name]) [name] ?Primary Care Jayde Mcallister: [name] ?Other Clinician: ?Referring Janiyah Beery: ?Treating Tramond Slinker/Extender: Fredirick Maudlin ?[name] ?Weeks in Treatment: 79 ?Edema Assessment ?Assessed: [Left: No] [Right: No] ?Edema: [Left: No] [Right: No] ?Calf ?Left: Right: ?Point of Measurement: 29 cm From Medial Instep 40 cm 43.3 cm ?Ankle ?Left: Right: ?Point of Measurement: 9 cm From Medial Instep 25 cm 23.8 cm ?Electronic Signature(s) ?Signed: 11/04/2021 12:11:01 PM By: [name] ?Entered By: [name] on 11/04/2021 09:43:49 ?-------------------------------------------------------------------------------- ?Multi Wound Chart Details ?Patient Name: ?Date of Service: ?[name], [name] 11/04/2021 9:30 A M ?Medical Record Number: [id number] ?Patient Account Number: [id number] ?Date of Birth/Sex: ?Treating RN: ?[birth year] (46 y.o. [name]) [name] ?Primary Care Taela Charbonneau: [name] ?Other Clinician: ?Referring Jyasia Markoff: ?Treating Ettore Trebilcock/Extender: Fredirick Maudlin ?[name] ?Weeks in Treatment: 56 ?Vital Signs ?Height(in): 69 ?Pulse(bpm): 76 ?Weight(lbs): 280 ?Blood Pressure(mmHg): 143/84 ?Body Mass Index(BMI): 41.3 ?Temperature(??F): 98.4 ?Respiratory Rate(breaths/min):  18 ?Photos: [N/A:N/A] ?Right Calcaneus Left Calcaneus N/A ?Wound Location: ?Pressure Injury Pressure Injury N/A ?Wounding Event: ?Pressure Ulcer Pressure Ulcer N/A ?Primary Etiology: ?Asthma, Angina, Hypertension Asthma, Angina, Hypertension N/A ?Comorbid History: ?10/07/2019 10/07/2019 N/A ?Date Acquired: ?69 68 N/A ?Weeks of Treatment: ?Open Open N/A ?Wound Status: ?No No N/A ?Wound Recurrence: ?2x1.7x0.2 3.2x1.2x0.4 N/A ?Measurements L x W x D (cm) ?2.67 3.016 N/A ?A (cm?) : ?rea ?0.534 1.206 N/A ?Volume (cm?) : ?91.60% 88.90% N/A ?% Reduction in A rea: ?98.30% 95.50% N/A ?% Reduction in Volume: ?Category/Stage III Category/Stage III  N/A ?Classification: ?Medium Medium N/A ?Exudate A mount: ?Serosanguineous Serosanguineous N/A ?Exudate Type: ?red, brown red, brown N/A ?Exudate Color: ?Distinct, outline attached Distinct, outline attached N/A ?Wound Margin: ?Small (1-33%) Small (1-33%) N/A ?Granulation A mount: ?Pink Red N/A ?Granulation Quality: ?Large (67-100%) Large (67-100%) N/A ?Necrotic A mount: ?Fat Layer (Subcutaneous Tissue): Yes Fat Layer (Subcutaneous Tissue): Yes N/A ?Exposed Structures: ?Fascia: No ?Fascia: No ?Tendon: No ?Tendon: No ?Muscle: No ?Muscle: No ?Joint: No ?Joint: No ?Bone: No ?Bone: No ?Small (1-33%) Small (1-33%) N/A ?Epithelialization: ?Debridement - Selective/Open Wound Debridement - Excisional N/A ?Debridement: ?Pre-procedure Verification/Time Out 09:54 09:54 N/A ?Taken: ?Lidocaine 5% topical ointment Lidocaine 5% topical ointment N/A ?Pain Control: ?Callus, Slough Callus, Subcutaneous, Slough N/A ?Tissue Debrided: ?Non-Viable Tissue Skin/Subcutaneous Tissue N/A ?Level: ?3.4 3.84 N/A ?Debridement A (sq cm): ?rea ?Curette Curette N/A ?Instrument: ?Minimum Minimum N/A ?Bleeding: ?Pressure Pressure N/A ?Hemostasis A chieved: ?0 0 N/A ?Procedural Pain: ?0 0 N/A ?Post Procedural Pain: ?Procedure was tolerated well Procedure was tolerated well N/A ?Debridement Treatment Response: ?2x1.7x0.2  3.2x1.2x0.4 N/A ?Post Debridement Measurements L x ?W x D (cm) ?0.534 1.206 N/A ?Post Debridement Volume: (cm?) ?Category/Stage III Category/Stage III N/A ?Post Debridement Stage: ?Debridement Debridement N/A ?Procedures Performed: ?T Contact Cast ?otal ?Treatment Notes ?Electronic Signature(s) ?Signed: 11/04/2021 11:27:19 AM By: Fredirick Maudlin MD FACS ?Signed: 11/04/2021 12:11:01 PM By: [name] ?Entered By: Fredirick Maudlin on 11/04/2021 11:27:18 ?-------------------------------------------------------------------------------- ?Multi-Disciplinary Care Plan Details ?Patient Name: ?Date of Service: ?[name], [name] 11/04/2021 9:30 A M ?Medical Record Number: [id number] ?Patient Account Number: [id number] ?Date of Birth/Sex: ?Treating RN: ?[birth year] (46 y.o. [name]) [name] ?Primary Care Maday Guarino: [name] ?Other Clinician: ?Referring Thanvi Blincoe: ?Treating Aizik Reh/Extender: Fredirick Maudlin ?[name] ?Weeks in Treatment: 27 ?Multidisciplinary Care Plan reviewed with physician ?Active Inactive ?Wound/Skin Impairment ?Nursing Diagnoses: ?Impaired tissue integrity ?Knowledge deficit related to ulceration/compromised skin integrity ?Goals: ?Patient/caregiver will verbalize understanding of skin care regimen ?Date Initiated: 07/15/2020 ?Target Resolution Date: 12/03/2021 ?Goal Status: Active ?Ulcer/skin breakdown will have a volume reduction of 30% by week 4 ?Date Initiated: 07/15/2020 ?Date Inactivated: 08/20/2020 ?Target Resolution Date: 09/03/2020 ?Goal Status: Unmet ?Unmet Reason: no major changes. ?Ulcer/skin breakdown will heal within 14 weeks ?Date Initiated: 12/04/2020 ?Date Inactivated: 12/10/2020 ?Target Resolution Date: 12/10/2020 ?Unmet Reason: wounds still open at 14 ?Goal Status: Unmet weeks and today 21 weeks. ?Interventions: ?Assess patient/caregiver ability to obtain necessary supplies ?Assess patient/caregiver ability to perform ulcer/skin care regimen upon admission and as needed ?Assess  ulceration(s) every visit ?Provide education on ulcer and skin care ?Treatment Activities: ?Skin care regimen initiated : 07/15/2020 ?Topical wound management initiated : 07/15/2020 ?Notes: ?Electronic Signature(s) ?Signed: 3/30/

## 2021-11-05 IMAGING — DX DG CHEST 1V PORT
1 series · 1 of 1 positions shown · non-contrast
Comparison: August 25, 2019.

CLINICAL DATA: Patient on ventilator.

EXAM:
PORTABLE CHEST 1 VIEW

[chest ap]
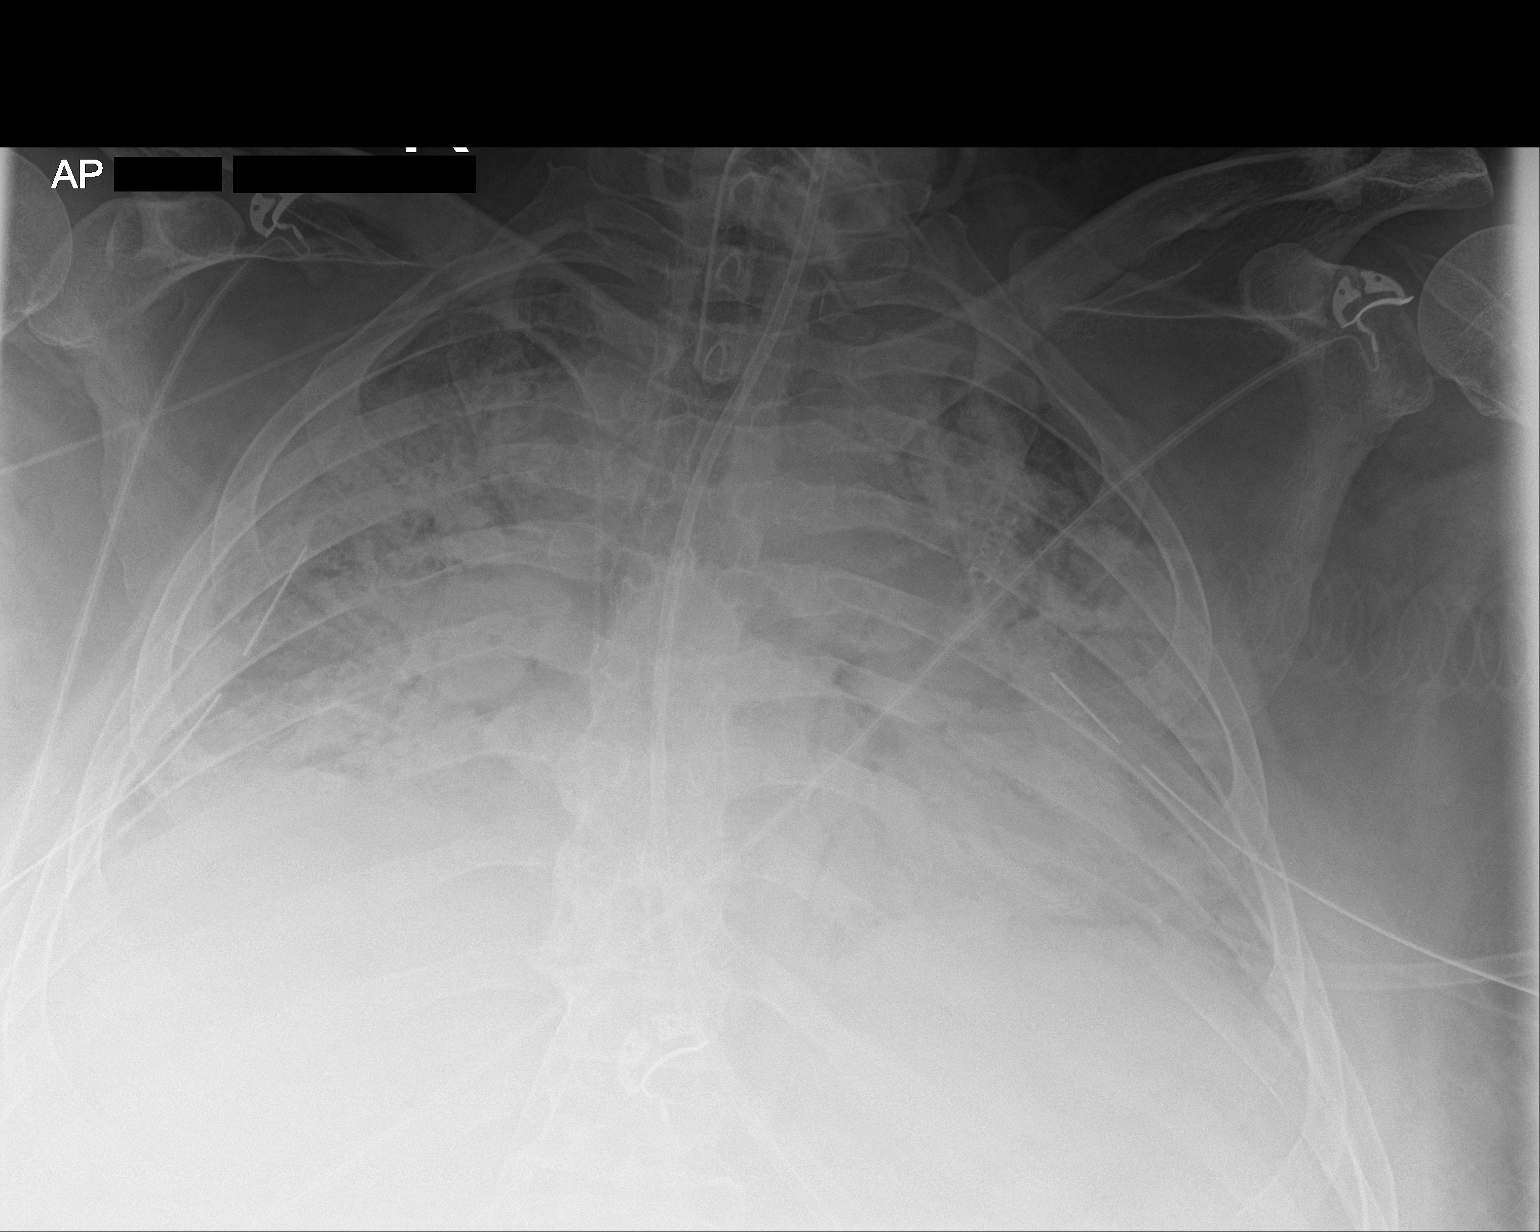

[1 of 1 positions shown; findings below may reference images not displayed]

FINDINGS: Tracheostomy and feeding tubes are unchanged in position. No
pneumothorax or pleural effusion is noted. Stable bilateral chest
tubes are noted. Stable diffuse lung opacities are noted consistent
multifocal pneumonia. Bony thorax is unremarkable.
IMPRESSION: Stable support apparatus. Stable bilateral lung opacities consistent
with multifocal pneumonia.

## 2021-11-06 IMAGING — DX DG CHEST 1V PORT
1 series · 1 of 1 positions shown · non-contrast
Comparison: 08/26/2019

CLINICAL DATA: Patient on ECMO.

EXAM:
PORTABLE CHEST 1 VIEW

[chest ap]
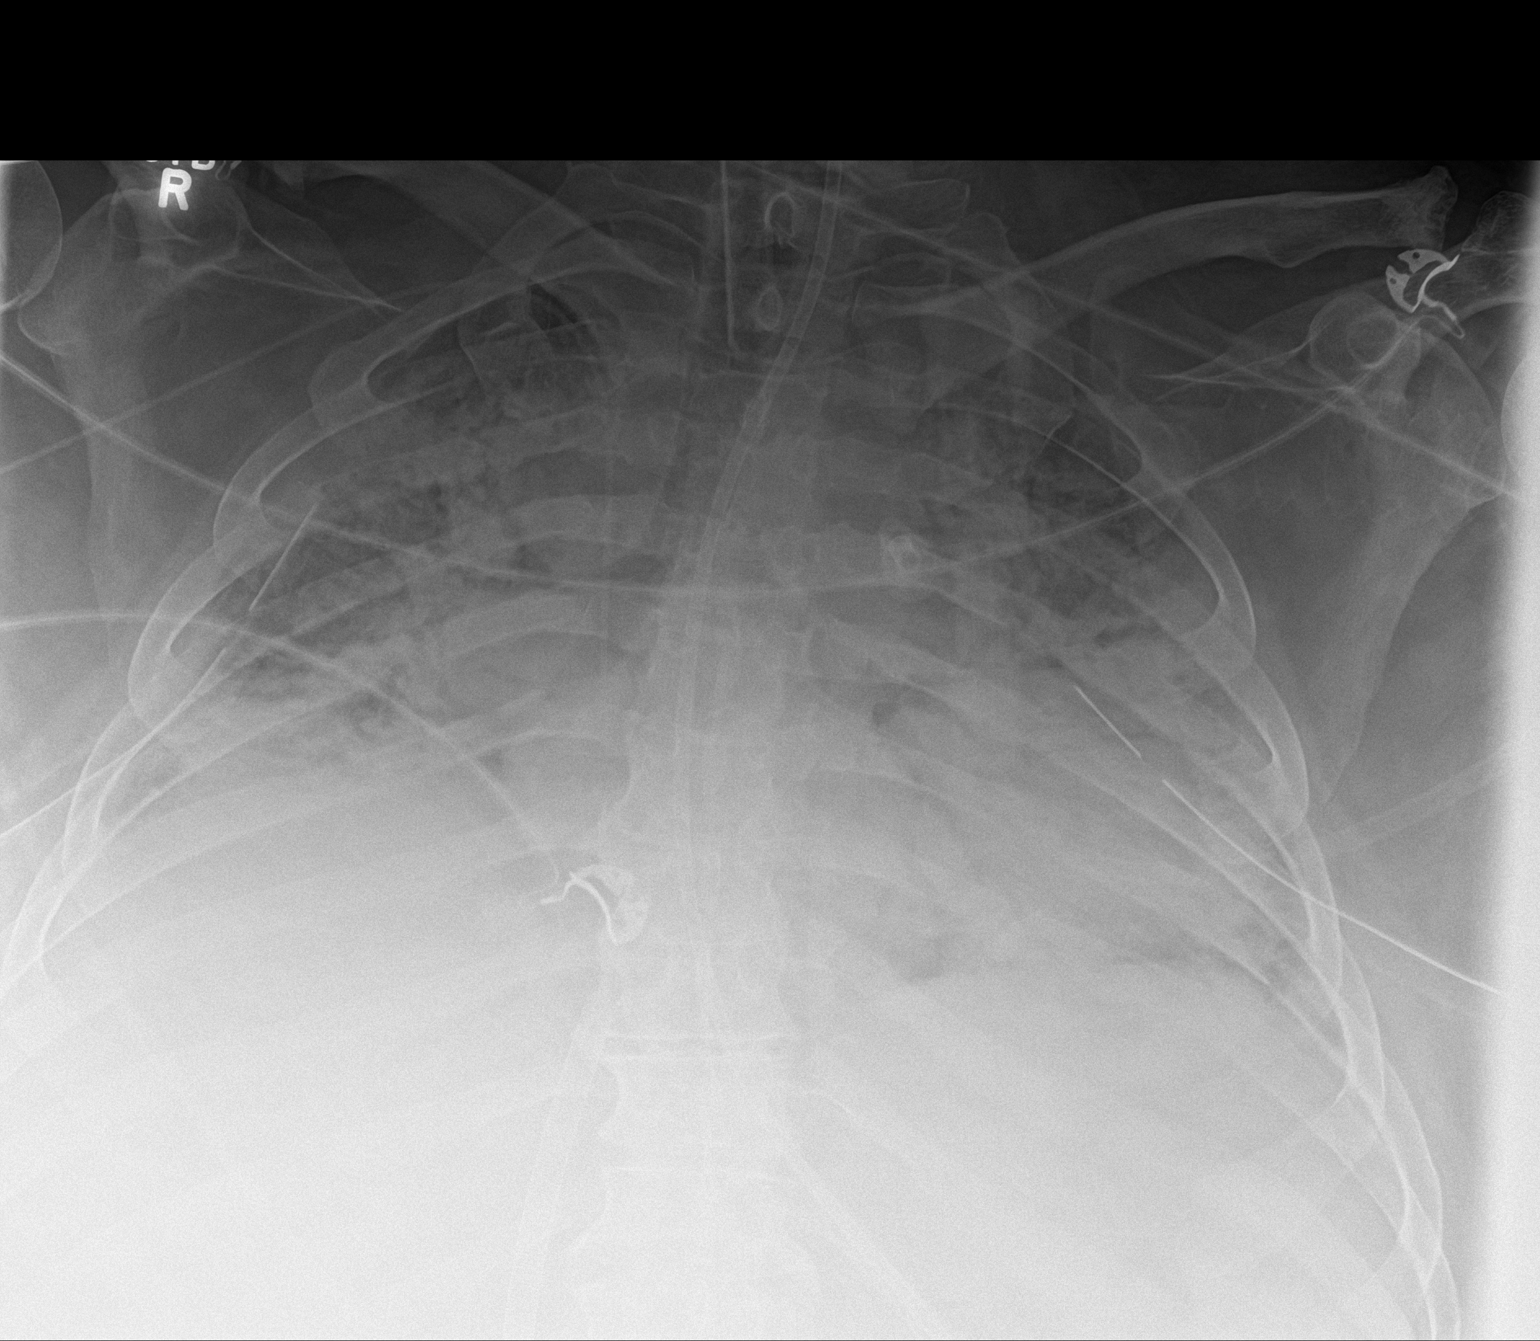

[1 of 1 positions shown; findings below may reference images not displayed]

FINDINGS: Tracheostomy tube is in a stable position. Feeding tube extends into
the abdomen but the tip is beyond the image. Bilateral chest tubes
are stable. Negative for a large pneumothorax. There continues to be
low lung volumes with diffuse bilateral airspace densities. Cardiac
silhouette is obscured by the lung densities. Right arm PICC line
tip in the lower SVC region. Large vascular cannula in the IVC with
the tip near the inferior cavoatrial junction.
IMPRESSION: Stable chest radiograph findings. Low lung volumes with diffuse
bilateral airspace disease.

No significant change in the support apparatuses.

## 2021-11-07 DIAGNOSIS — U071 COVID-19: Secondary | ICD-10-CM | POA: Diagnosis not present

## 2021-11-07 DIAGNOSIS — G4734 Idiopathic sleep related nonobstructive alveolar hypoventilation: Secondary | ICD-10-CM | POA: Diagnosis not present

## 2021-11-07 IMAGING — DX DG CHEST 1V PORT
1 series · 1 of 1 positions shown · non-contrast
Comparison: Portable exam 9758 hours compared to 8026 hours

CLINICAL DATA: Feeding tube dysfunction

EXAM:
PORTABLE CHEST 1 VIEW

[chest]
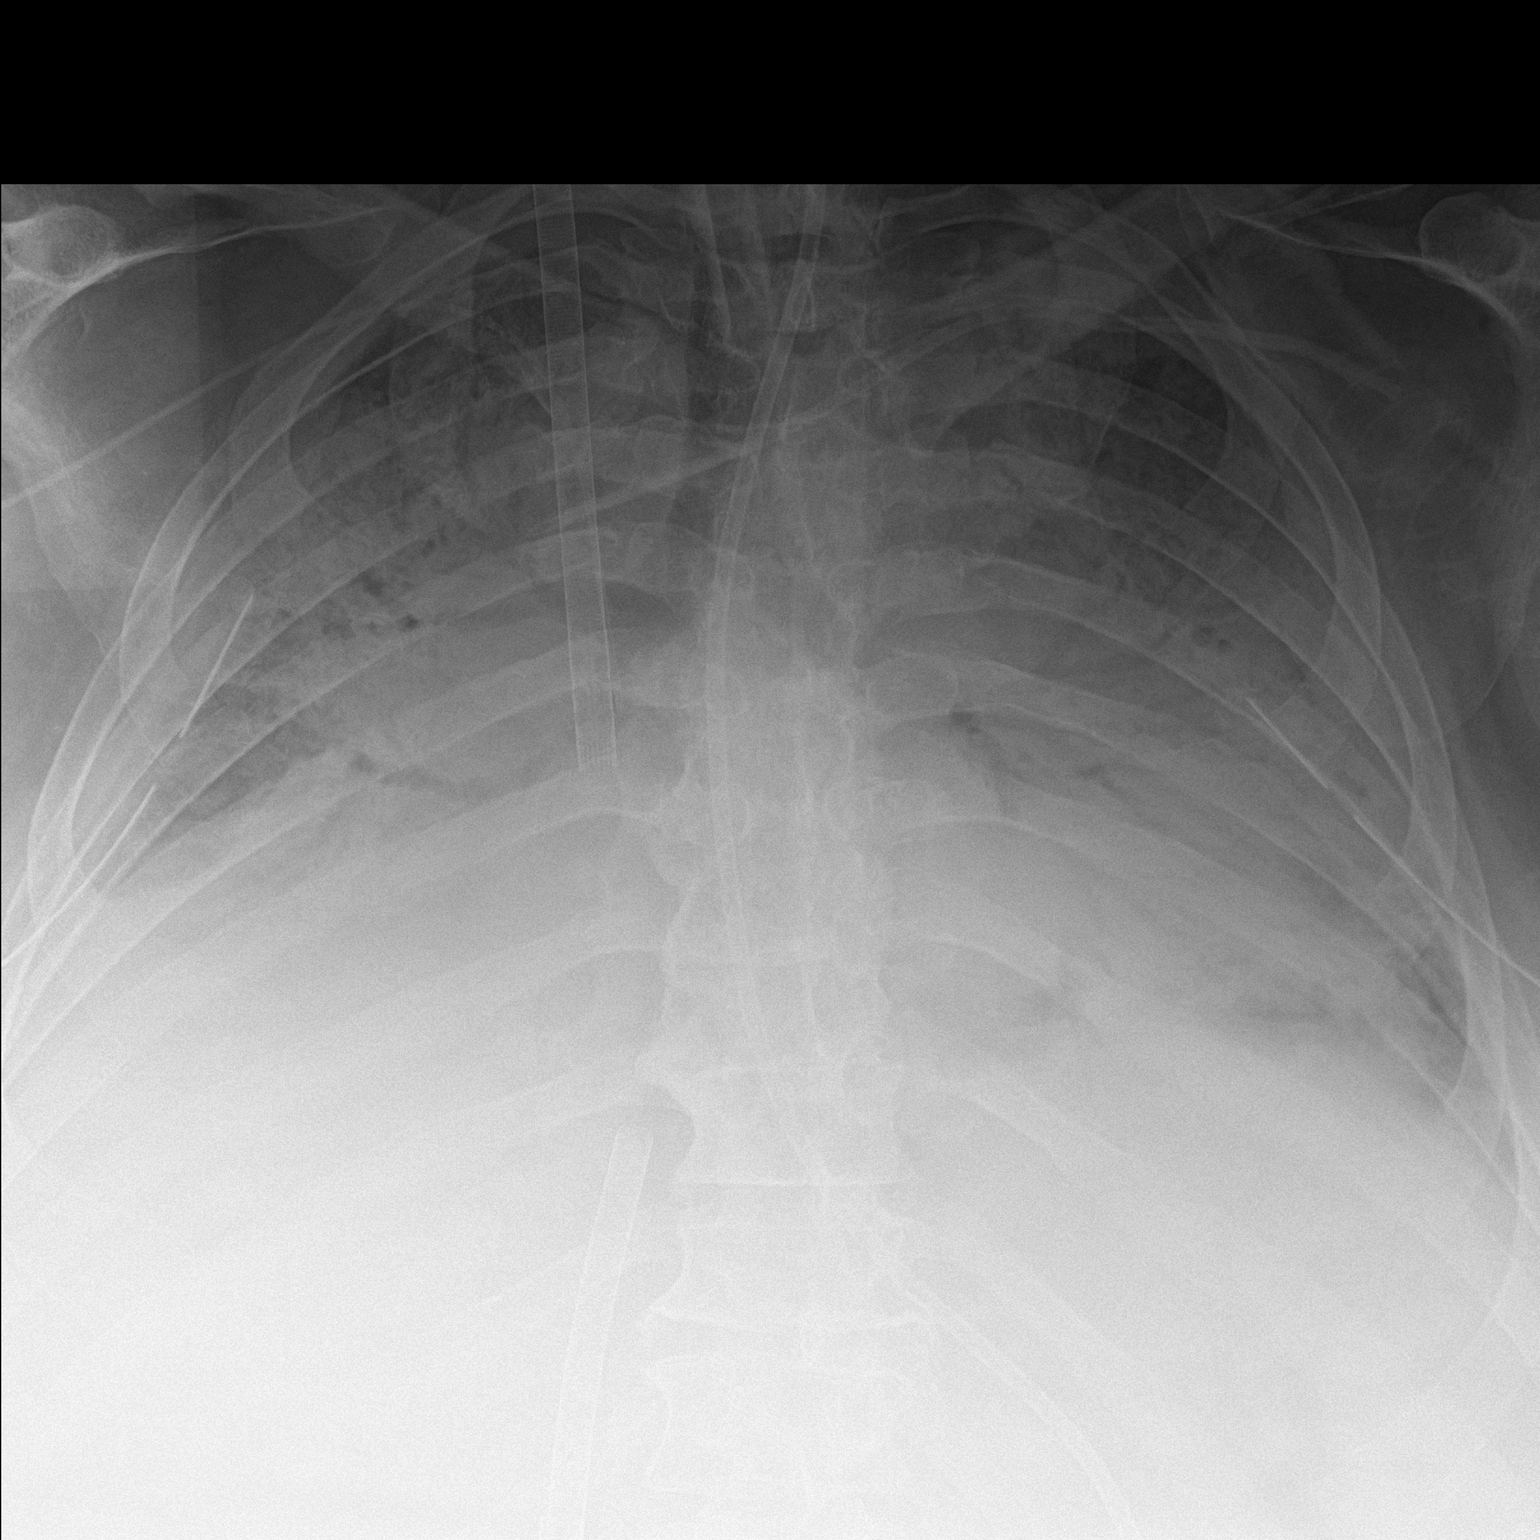

[1 of 1 positions shown; findings below may reference images not displayed]

FINDINGS: Tip of tracheostomy tube projects 4.5 cm above carina.

Feeding tube extends into abdomen.

Large-bore central catheters from subdiaphragmatic and RIGHT jugular
approaches appear unchanged.

BILATERAL thoracostomy tubes unchanged.

LEFT subclavian central venous catheter with tip projecting over
SVC.

Extensive BILATERAL airspace consolidation with air bronchograms,
minimally improved.

No pneumothorax.
IMPRESSION: Persistent severe diffuse BILATERAL airspace infiltrates, minimally
improved.

## 2021-11-07 IMAGING — DX DG ABD PORTABLE 1V
1 series · 1 of 1 positions shown · non-contrast
Comparison: Portable exam 7593 hours

CLINICAL DATA: Feeding tube dysfunction

EXAM:
PORTABLE ABDOMEN - 1 VIEW

[abdomen]
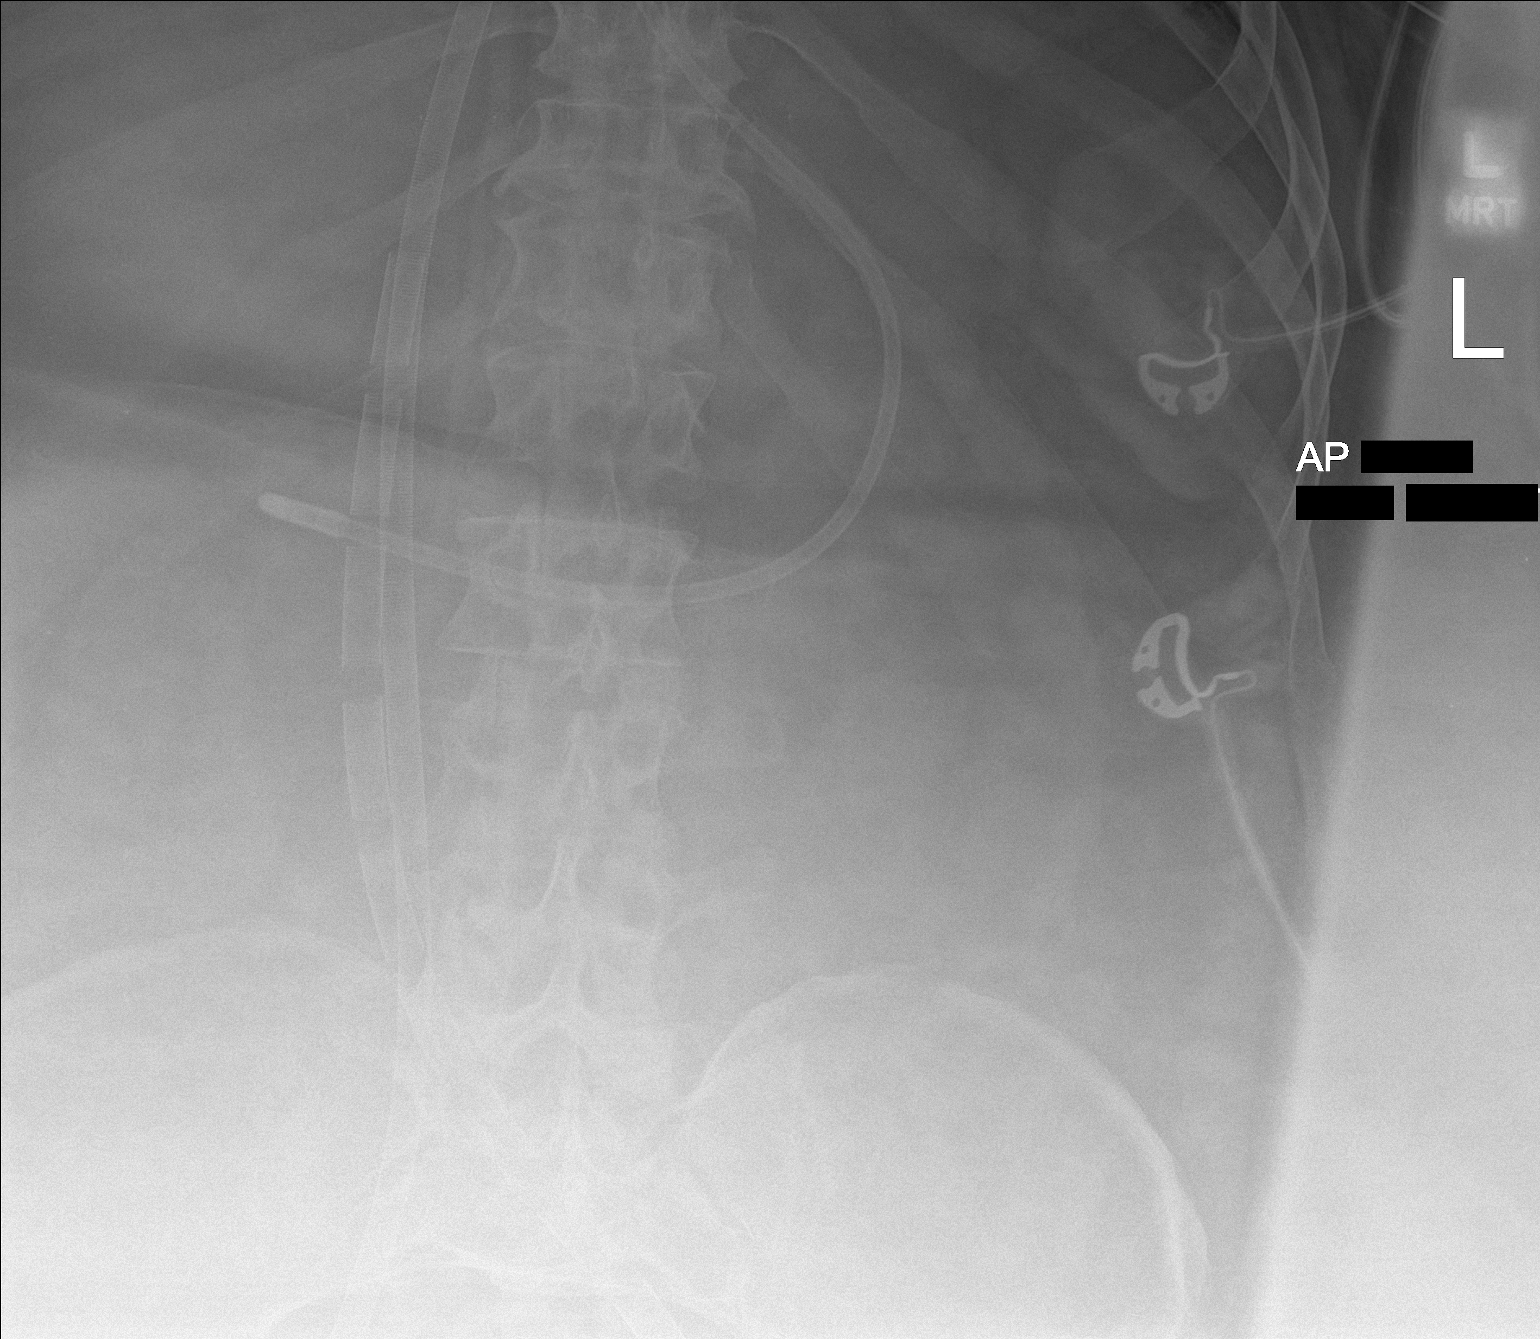

[1 of 1 positions shown; findings below may reference images not displayed]

FINDINGS: Tip of feeding tube projects over distal gastric antrum.

Large-bore central venous catheters via femoral approach is, tip of
1 at T12, second above exam.

Paucity of bowel gas.

No osseous findings.
IMPRESSION: Tip of feeding tube projects over distal gastric antrum.

## 2021-11-07 IMAGING — DX DG CHEST 1V PORT
1 series · 1 of 1 positions shown · non-contrast
Comparison: Radiograph 08/27/2019

CLINICAL DATA: Endotracheal tube

EXAM:
PORTABLE CHEST 1 VIEW

[chest ap]
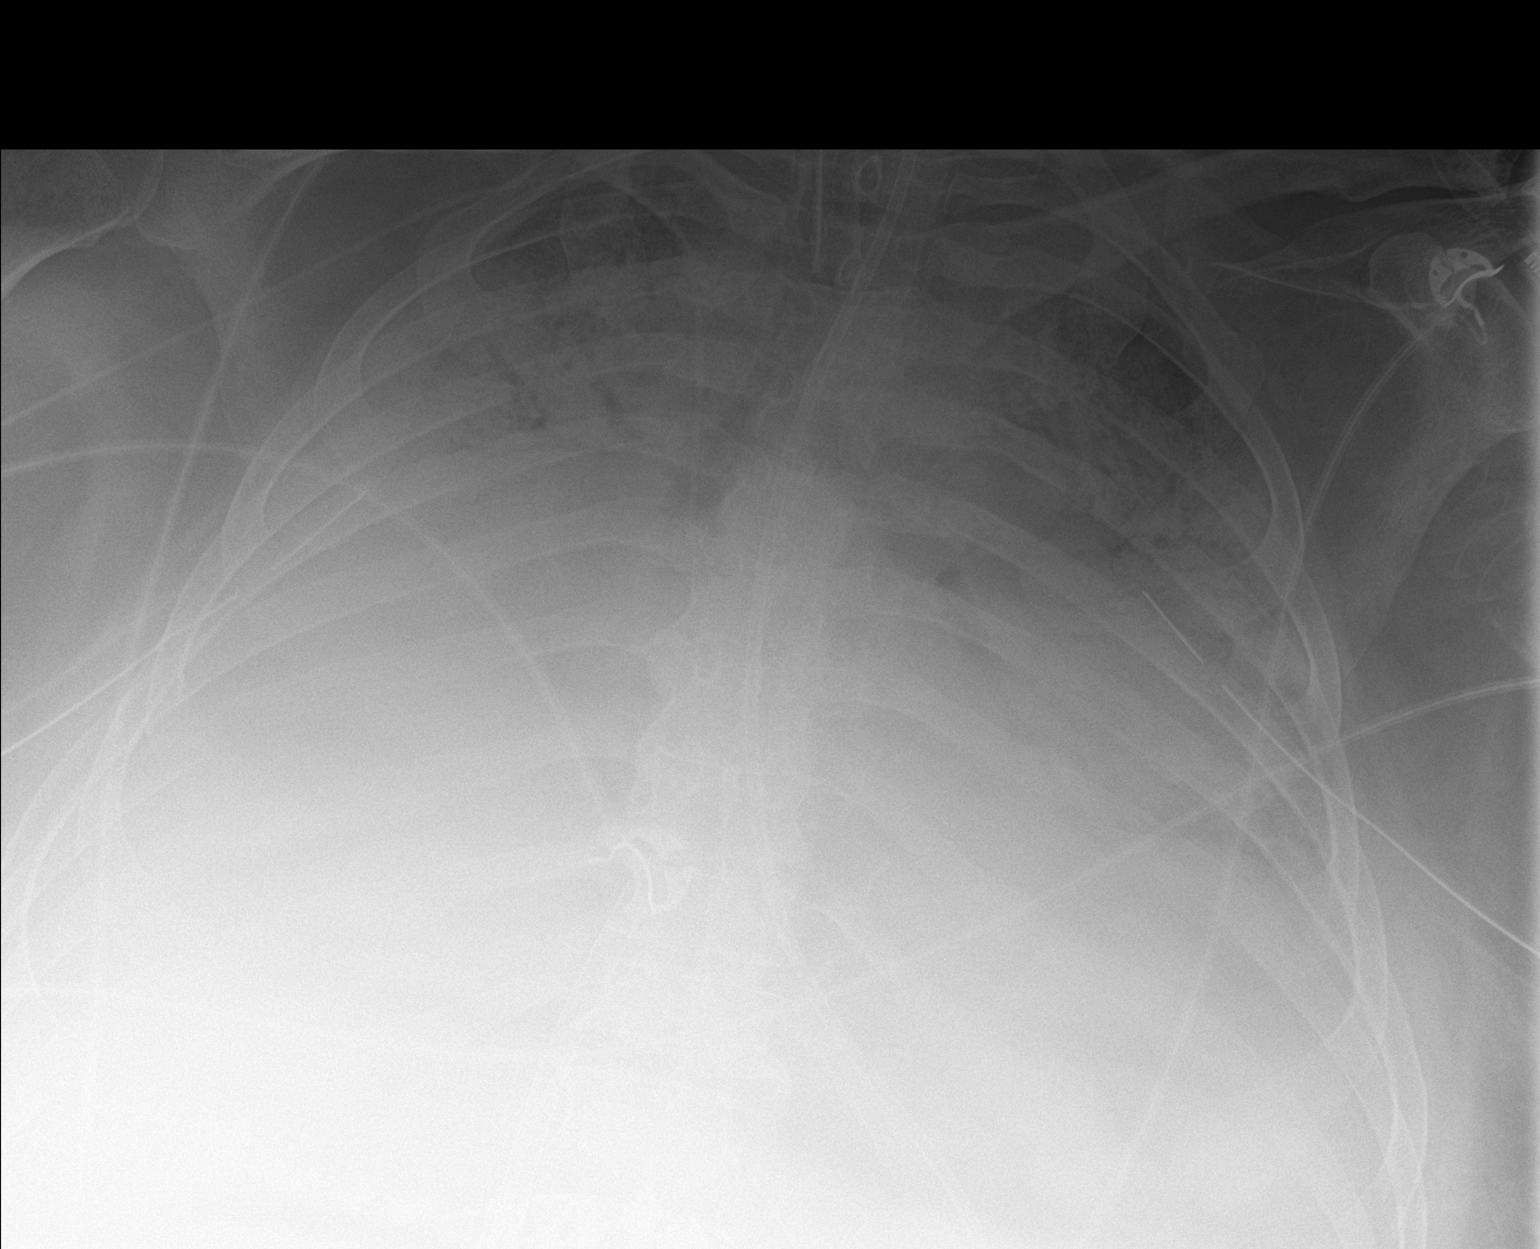

[1 of 1 positions shown; findings below may reference images not displayed]

FINDINGS: Tracheostomy tube, feeding tube, and PICC line unchanged. Bilateral
chest tubes in place. No pneumothorax.

There is dense diffuse bilateral airspace disease not improved. Low
lung volumes.
IMPRESSION: 1. No significant change.
2. Stable support apparatus.
3. Dense airspace disease and low lung volumes.

## 2021-11-07 IMAGING — CR DG CHEST 1V
1 series · 1 of 1 positions shown · non-contrast
Comparison: 08/28/2019

CLINICAL DATA: Portable chest in OR.  Cannulation for ECMO.

EXAM:
CHEST  1 VIEW

[AP]
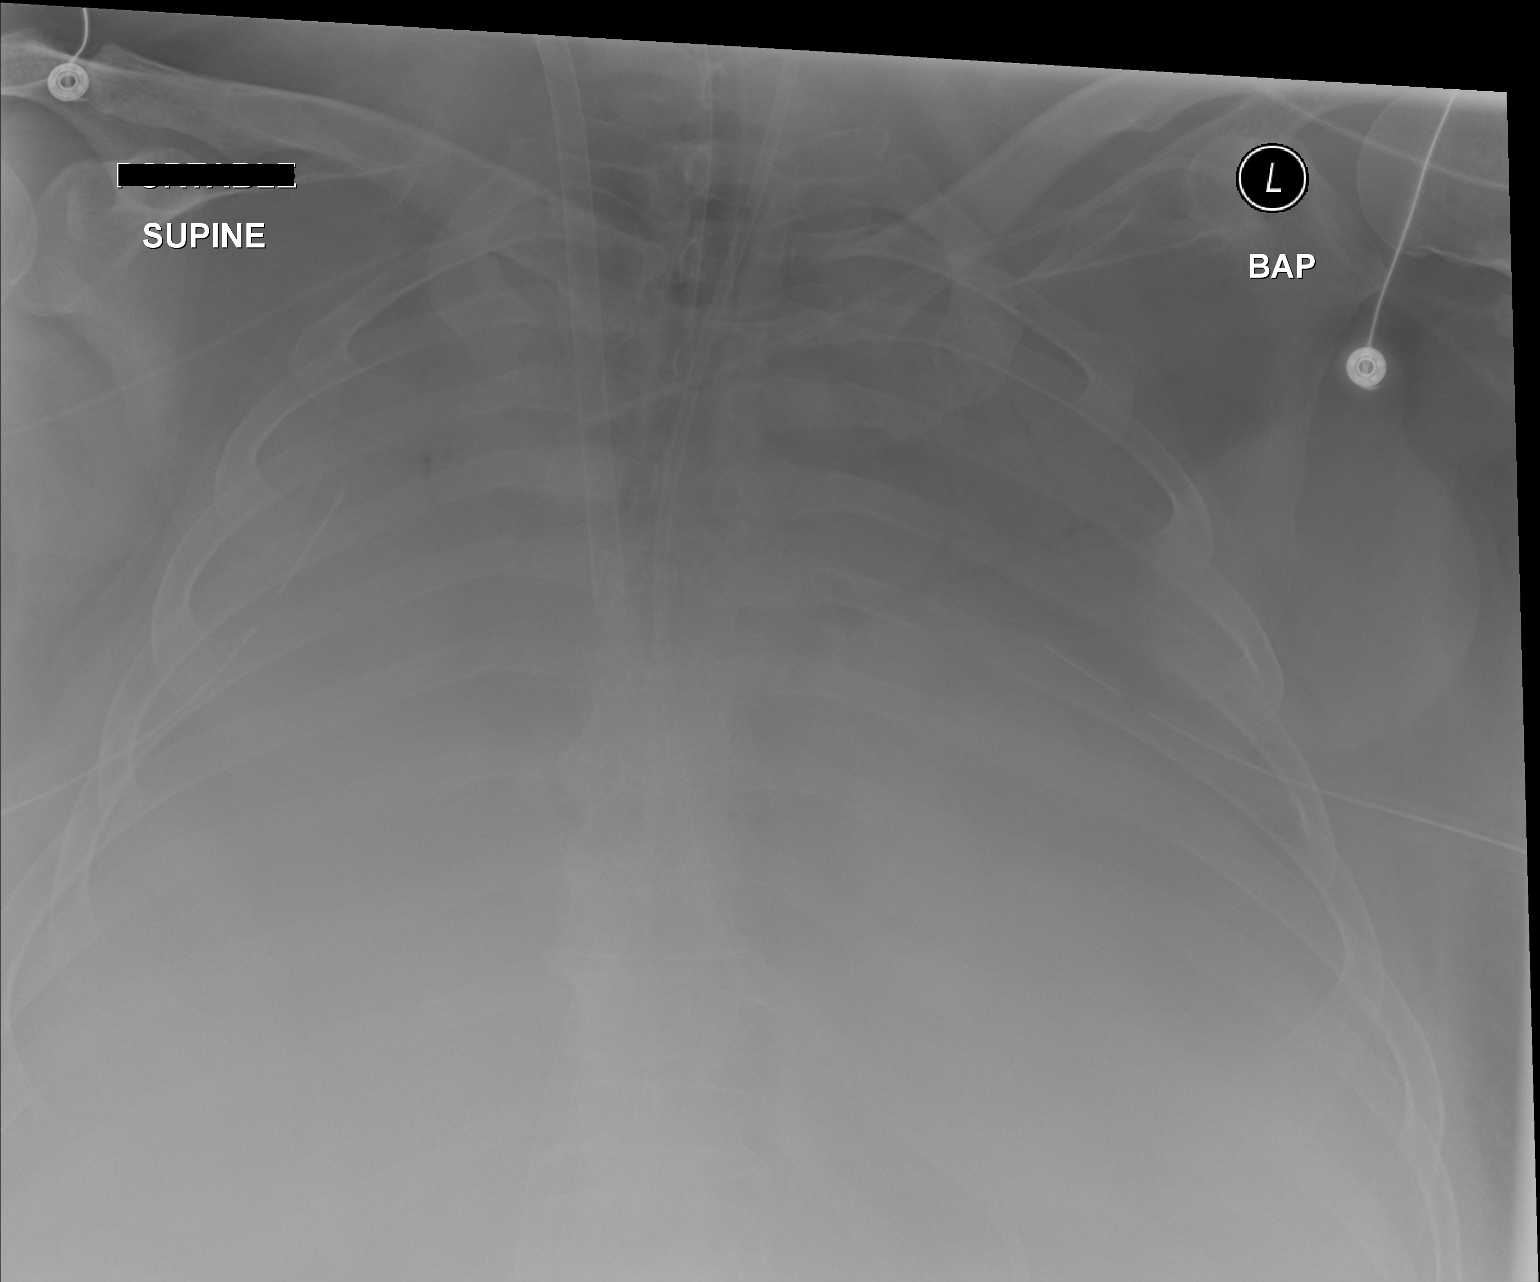

[1 of 1 positions shown; findings below may reference images not displayed]

FINDINGS: Endotracheal tube has tip approximately 4.2 cm above the carina.
Right IJ large-bore central venous catheter with tip over the SVC.
Left subclavian central venous catheter is obliquely oriented at the
junction of the left breast valve vein to SVC. Enteric tube courses
into the region of the stomach and off the film as tip is not
visualized. Large bore catheter is seen over the upper abdomen with
tip in the expected region of the inferior cavoatrial junction.
Bilateral chest tubes in place and unchanged. Right-sided PICC line
is present as tip is not accurately visualized.

Lungs demonstrate persistent severe bilateral opacification with air
bronchograms without significant change. Remainder the exam is
unchanged.
IMPRESSION: 1. Severe bilateral airspace opacification with air bronchograms
without significant change.

2.  Multiple tubes and lines as described.

## 2021-11-08 IMAGING — DX DG ABDOMEN 1V
1 series · 1 of 1 positions shown · non-contrast
Comparison: 08/28/2019

CLINICAL DATA: Feeding tube placement

EXAM:
ABDOMEN - 1 VIEW

[abdomen kub]
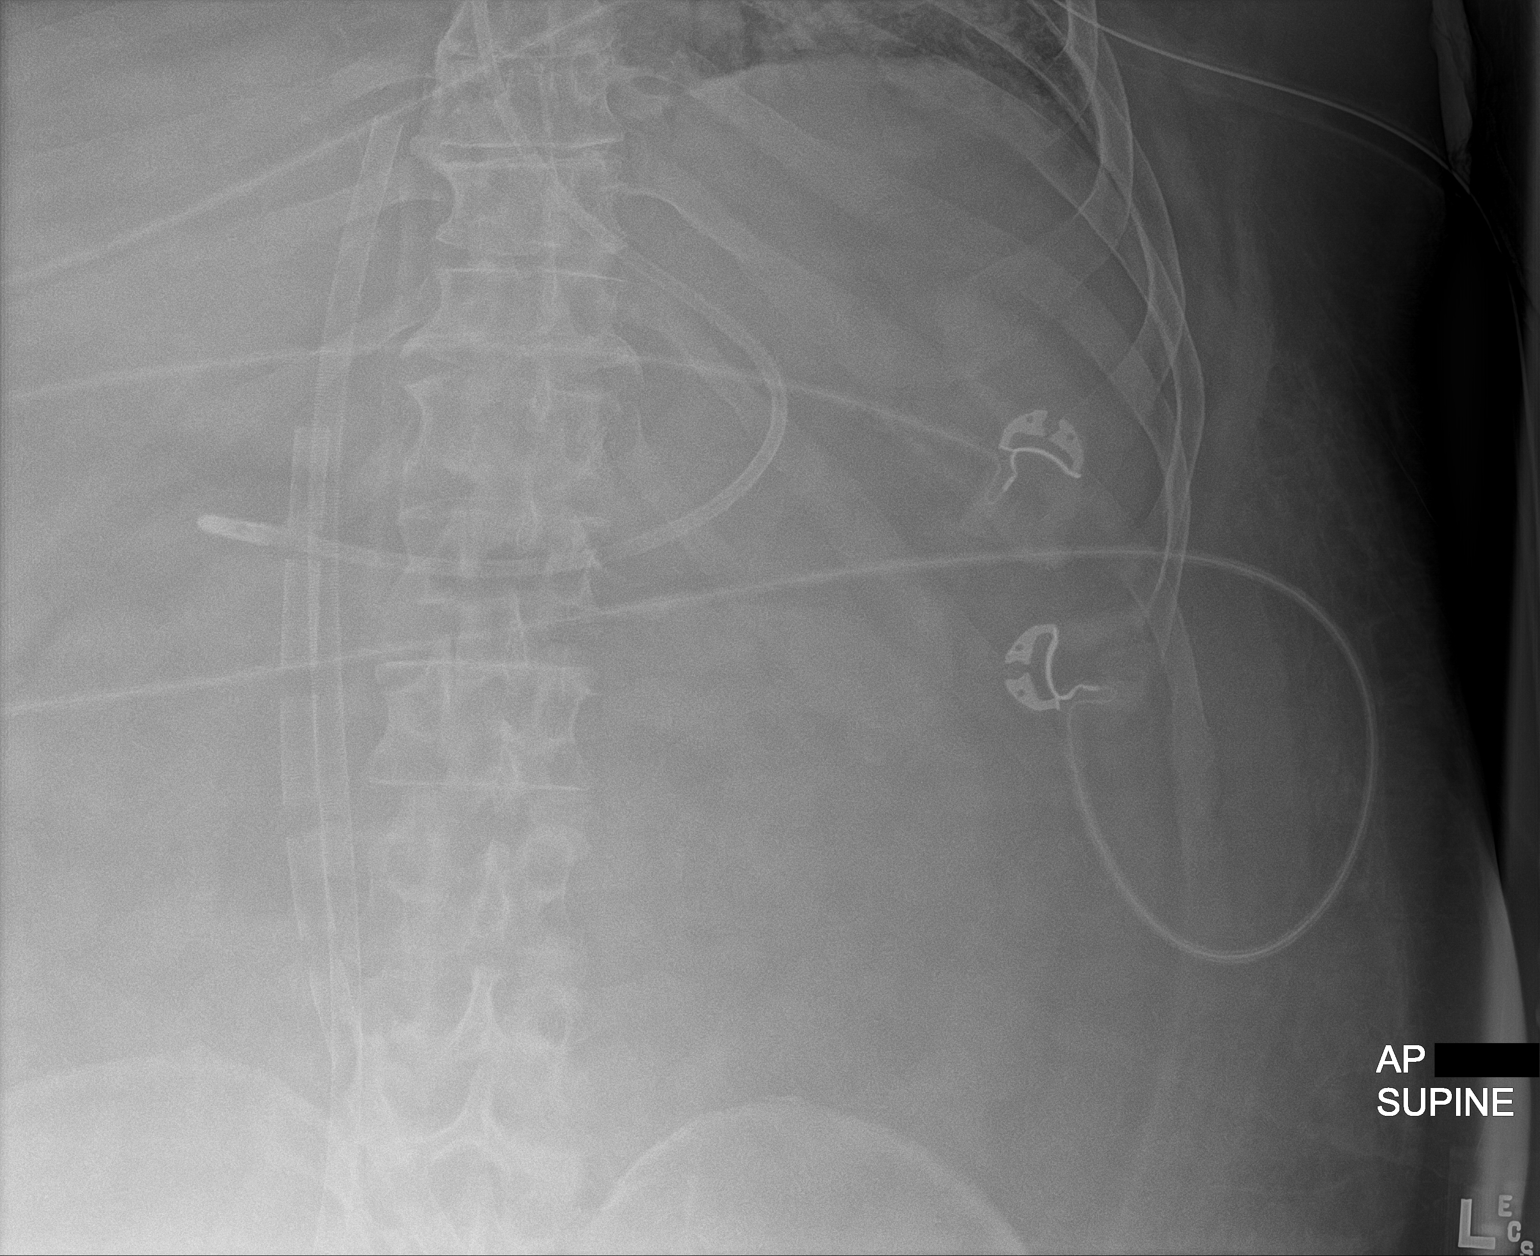

[1 of 1 positions shown; findings below may reference images not displayed]

FINDINGS: Enteric tube is unchanged in position with tip overlying the distal
stomach. Femoral approach central venous catheter is again
identified. Paucity of bowel gas again noted.
IMPRESSION: Enteric tube tip overlies distal stomach.

## 2021-11-08 IMAGING — DX DG ABDOMEN 1V
1 series · 1 of 1 positions shown · non-contrast
Comparison: Portable abdomen x-ray earlier same day at [DATE] a.m.
and previously.

CLINICAL DATA: Bedside gastric tube placement.

EXAM:
Portable [id number] VIEW [DATE] p.m.:

[abdomen kub]
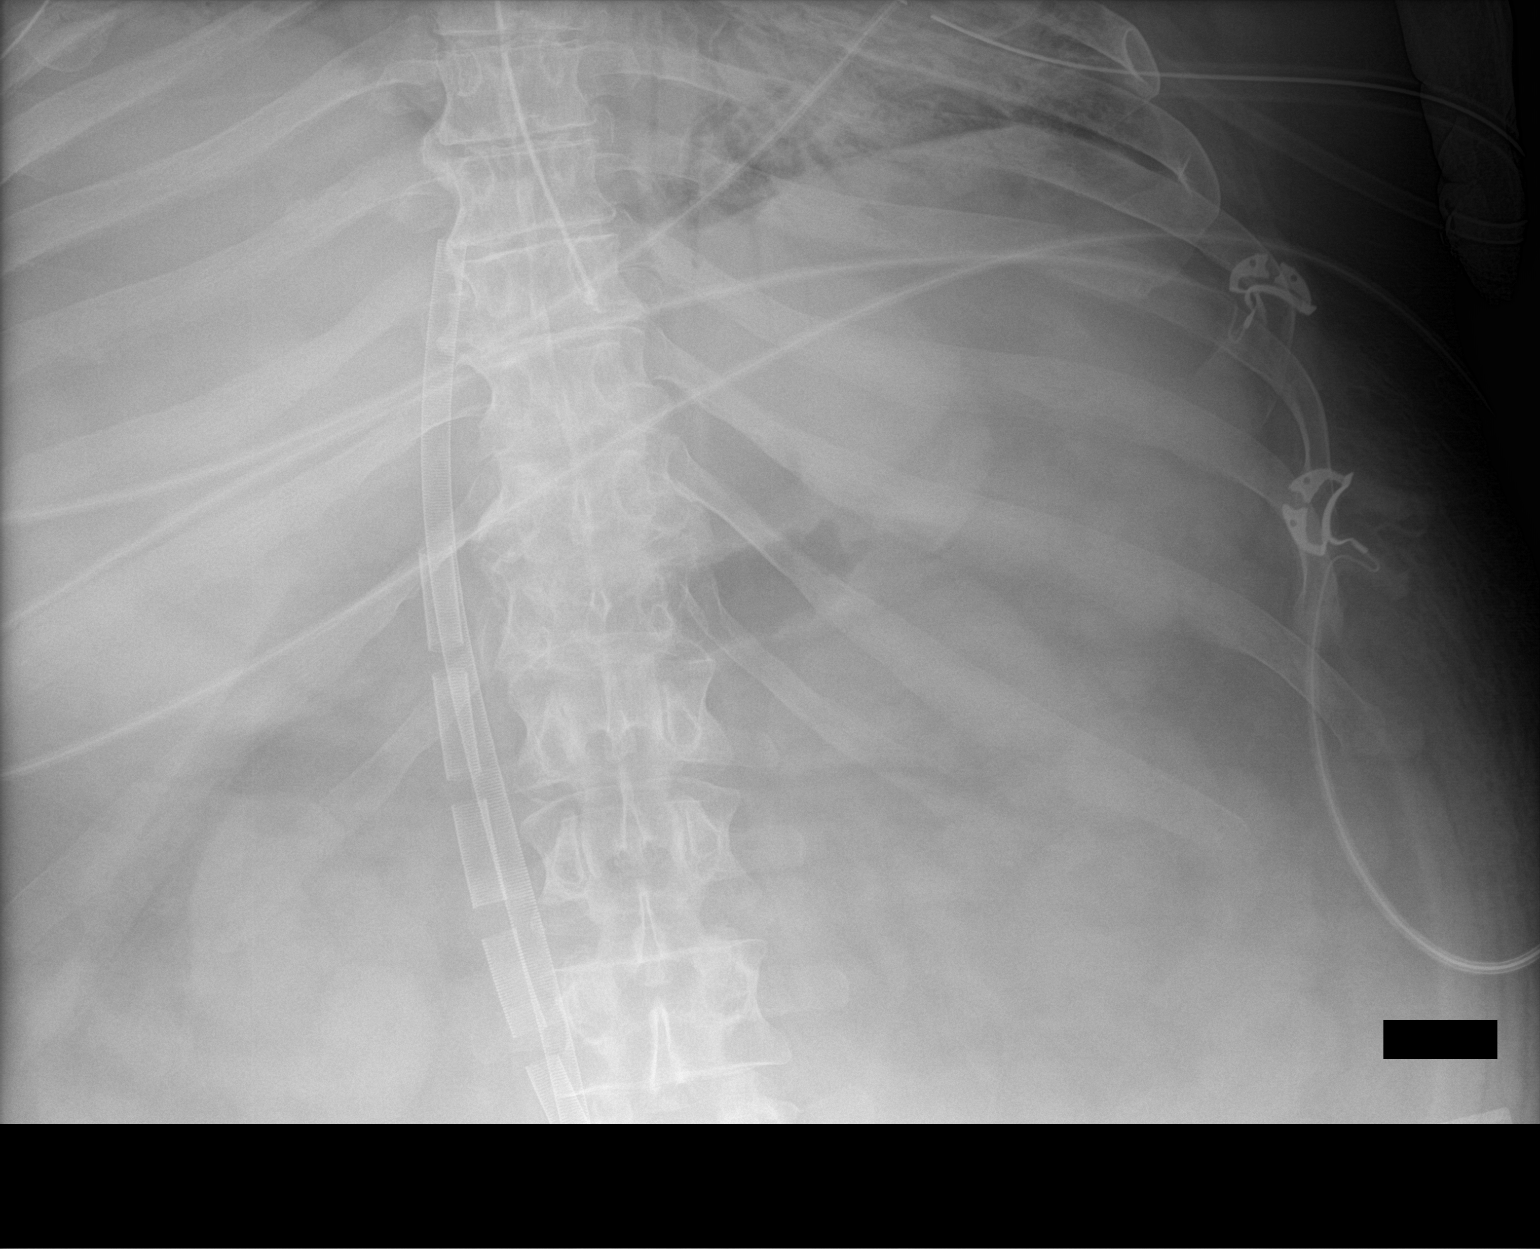

[1 of 1 positions shown; findings below may reference images not displayed]

FINDINGS: Since the examination earlier same day, the feeding tube has been
removed. Nasogastric tube tip in the distal esophagus at the EG
junction and should be advanced several centimeters. Hemodialysis
device in the IVC as noted previously. Visualized upper abdominal
bowel gas pattern unremarkable. Airspace consolidation with air
bronchograms is identified in the visualized LEFT LOWER LOBE.
IMPRESSION: 1. Nasogastric tube tip in the distal esophagus at the EG junction
and the tube should be advanced several centimeters.
2. LEFT LOWER LOBE pneumonia.
3. No acute abdominal abnormality.

These results will be called to the ordering clinician or
representative by the Radiologist Assistant, and communication
documented in the PACS or zVision Dashboard.

## 2021-11-08 IMAGING — DX DG CHEST 1V PORT
1 series · 1 of 1 positions shown · non-contrast
Comparison: 08/28/2019.

CLINICAL DATA: ECMO.

EXAM:
PORTABLE CHEST 1 VIEW

[chest ap]
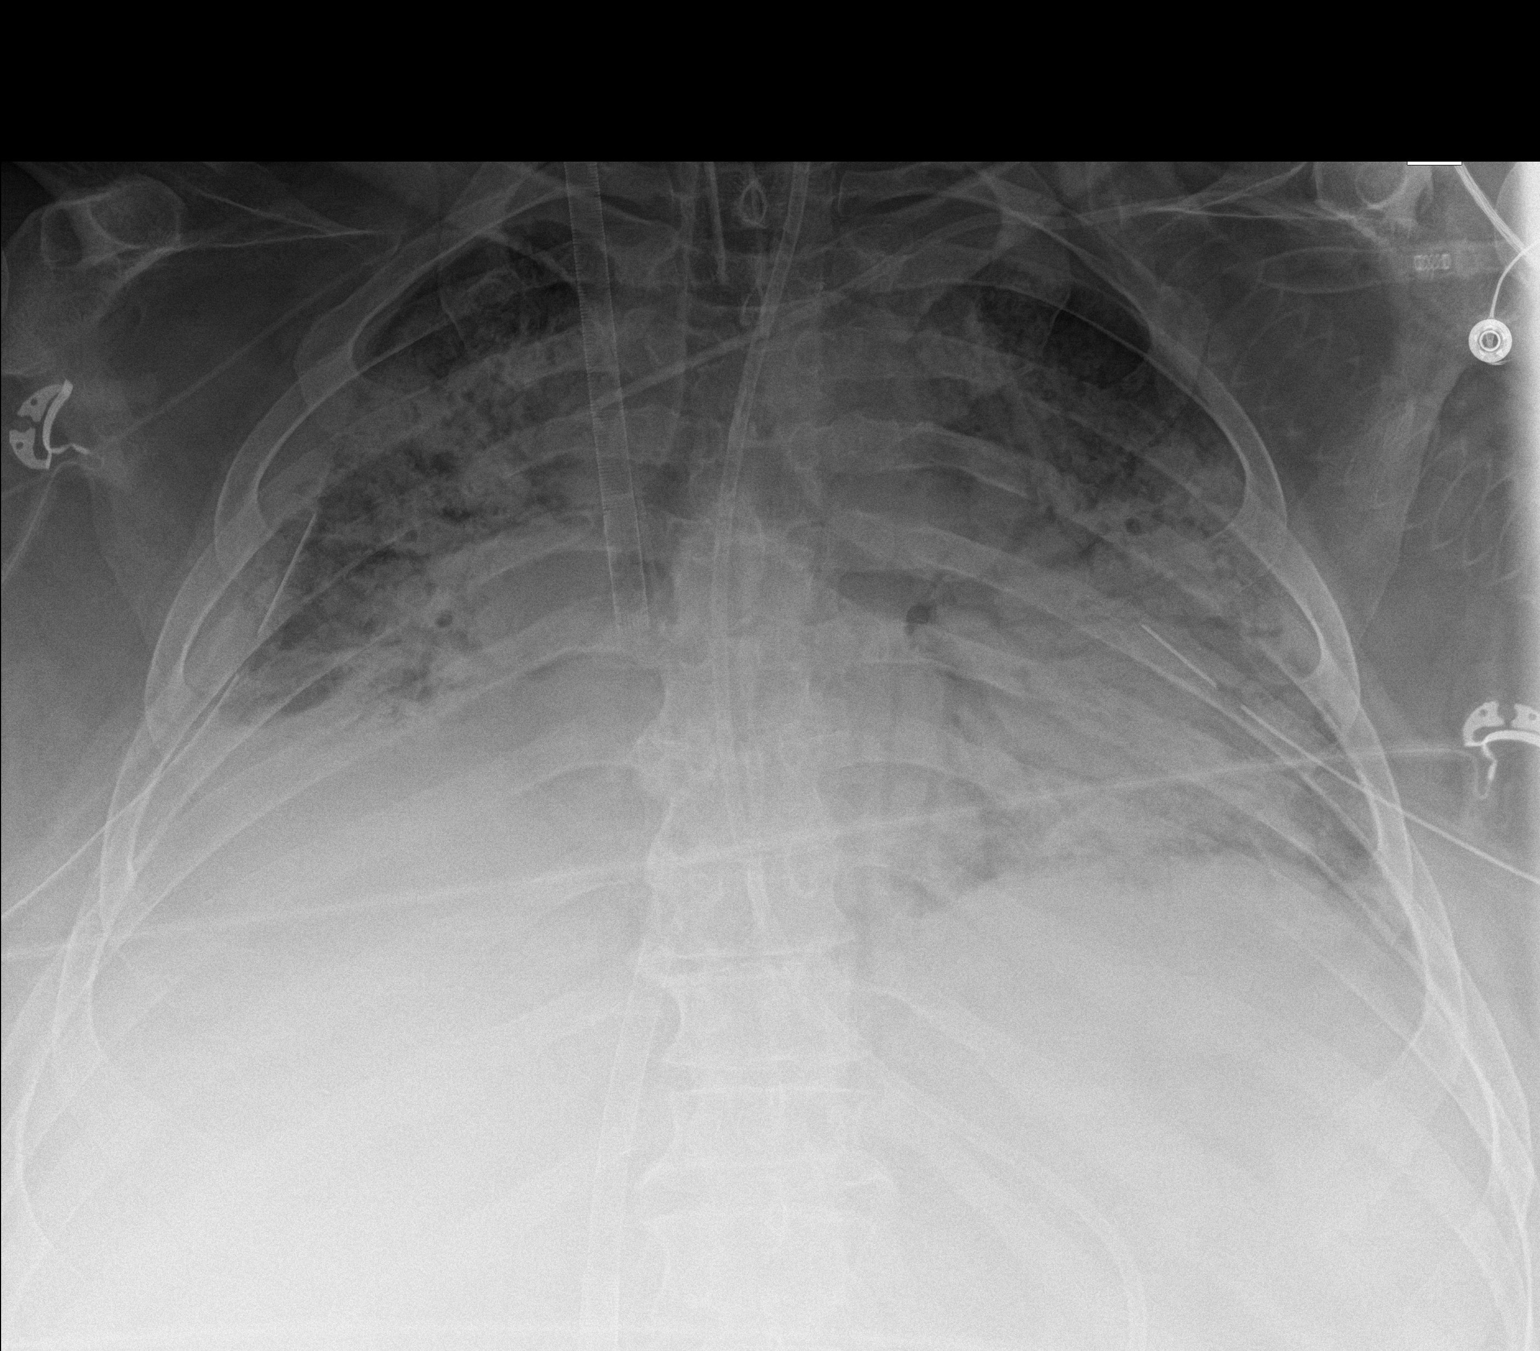

[1 of 1 positions shown; findings below may reference images not displayed]

FINDINGS: Tracheostomy tube, feeding tube, large bore right IJ and IVC
catheters, left subclavian central line, right PICC line, and
bilateral chest tubes in stable position. No pneumothorax. Stable
cardiomegaly. Low lung volumes. Diffuse severe bilateral pulmonary
infiltrates again noted without interim change. No pleural effusion.
IMPRESSION: 1. Lines and tubes including bilateral chest tubes in stable
position. No pneumothorax.

2. Lung volumes. Diffuse severe bilateral pulmonary infiltrates
again noted without interim change.

3.  Stable cardiomegaly.

## 2021-11-09 IMAGING — DX DG CHEST 1V PORT
1 series · 1 of 1 positions shown · non-contrast
Comparison: 08/30/2019, 08/28/2019, 08/27/2019, 08/26/2019

CLINICAL DATA: Chest tube

EXAM:
PORTABLE CHEST 1 VIEW

[chest ap]
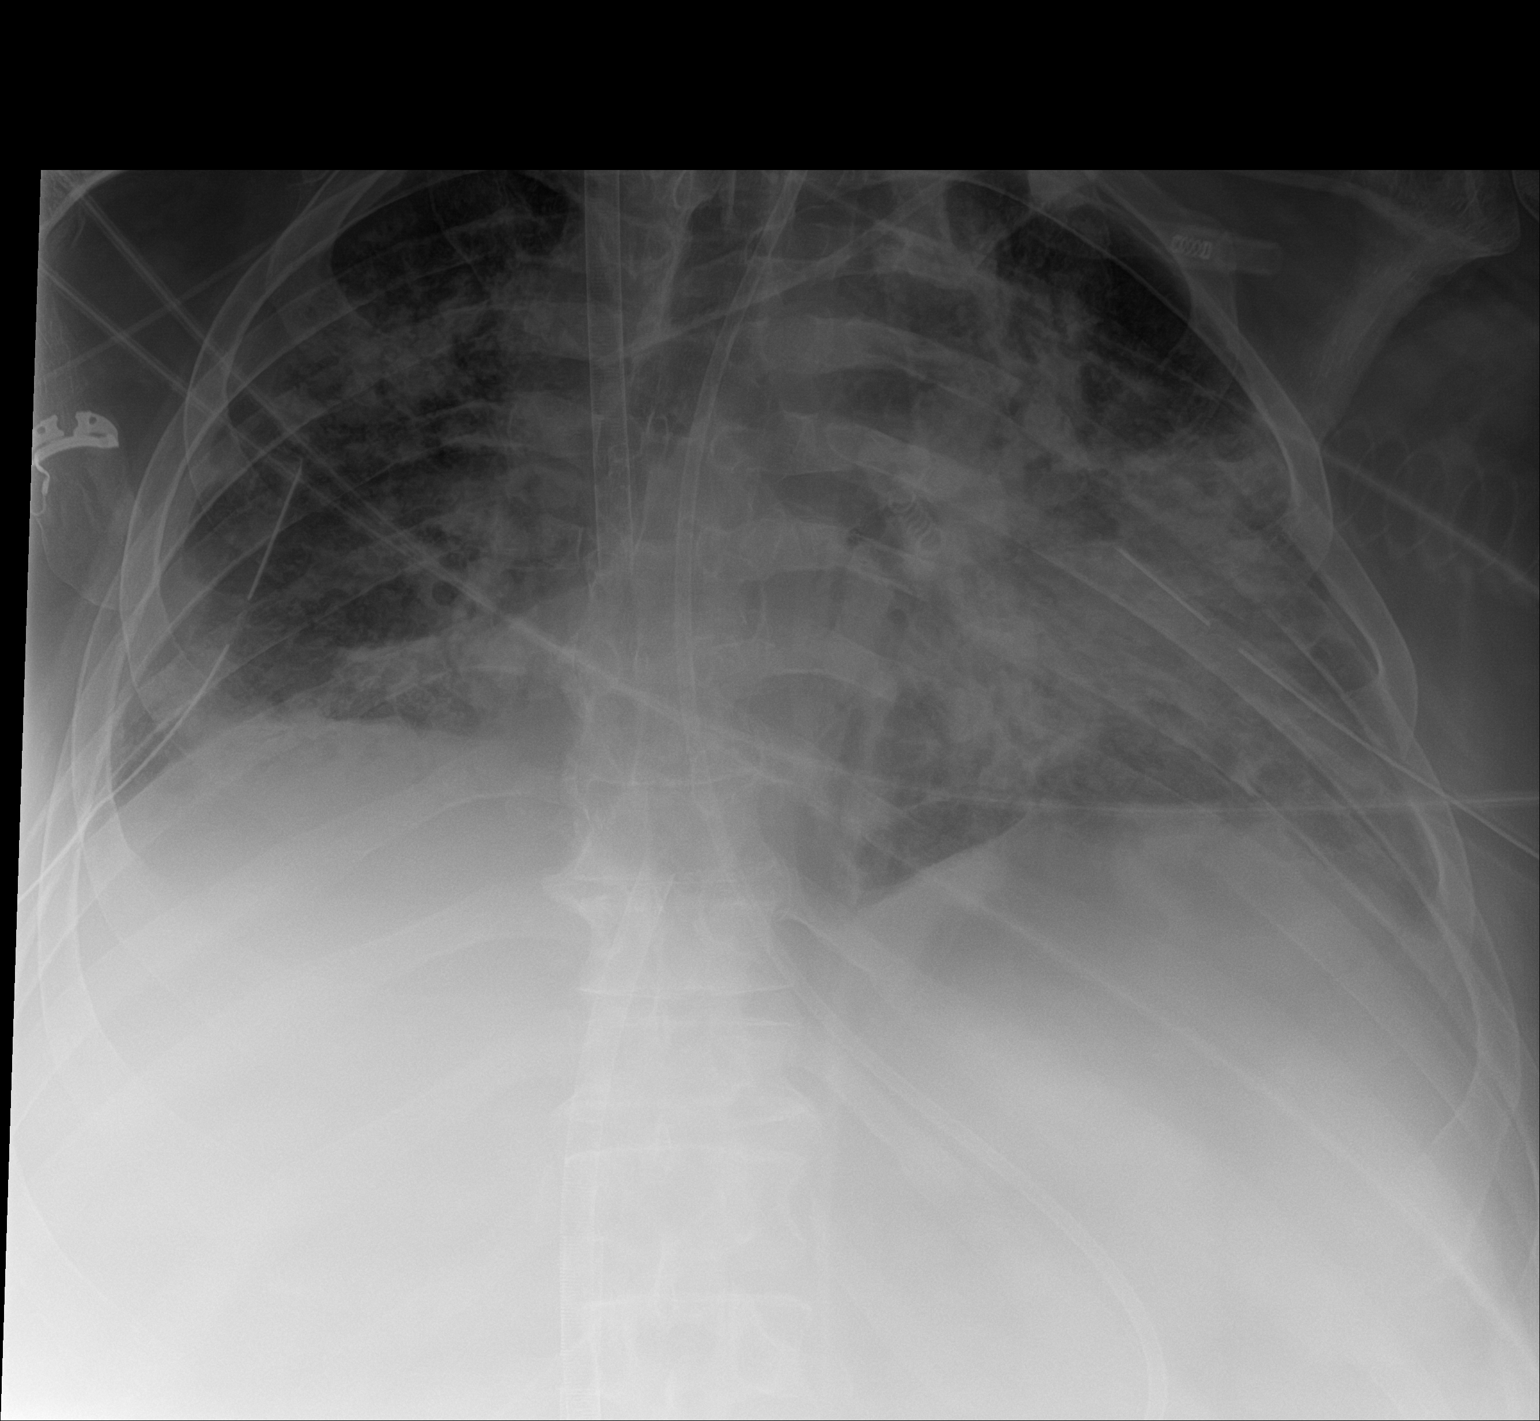

[1 of 1 positions shown; findings below may reference images not displayed]

FINDINGS: Tip of the tracheal tube is about 3.4 cm superior to the carina.
Esophageal tube tip below the diaphragm but incompletely included.
Bilateral chest tubes remain in place. Large bore catheter on the
right, from superior approach, the tip overlies the proximal right
atrium. From inferior approach, the tip overlies the expected
location of IVC right atrial junction. Left-sided central venous
catheter tip over the SVC. Low lung volumes. There is slightly
improved aeration right greater than left. Fairly extensive
bilateral lung consolidations remain. Probable small effusions. The
cardiomediastinal silhouette is obscured.
IMPRESSION: 1. Support lines and tubes as detailed above.
2. Low lung volumes with slightly improved aeration bilaterally.
Fairly extensive residual left greater than right pulmonary airspace
disease.

## 2021-11-09 IMAGING — DX DG CHEST 1V PORT
1 series · 1 of 1 positions shown · non-contrast
Comparison: Radiograph yesterday.

CLINICAL DATA: Acute respiratory failure. Hypoxia. ECMO.

EXAM:
PORTABLE CHEST 1 VIEW

[chest ap]
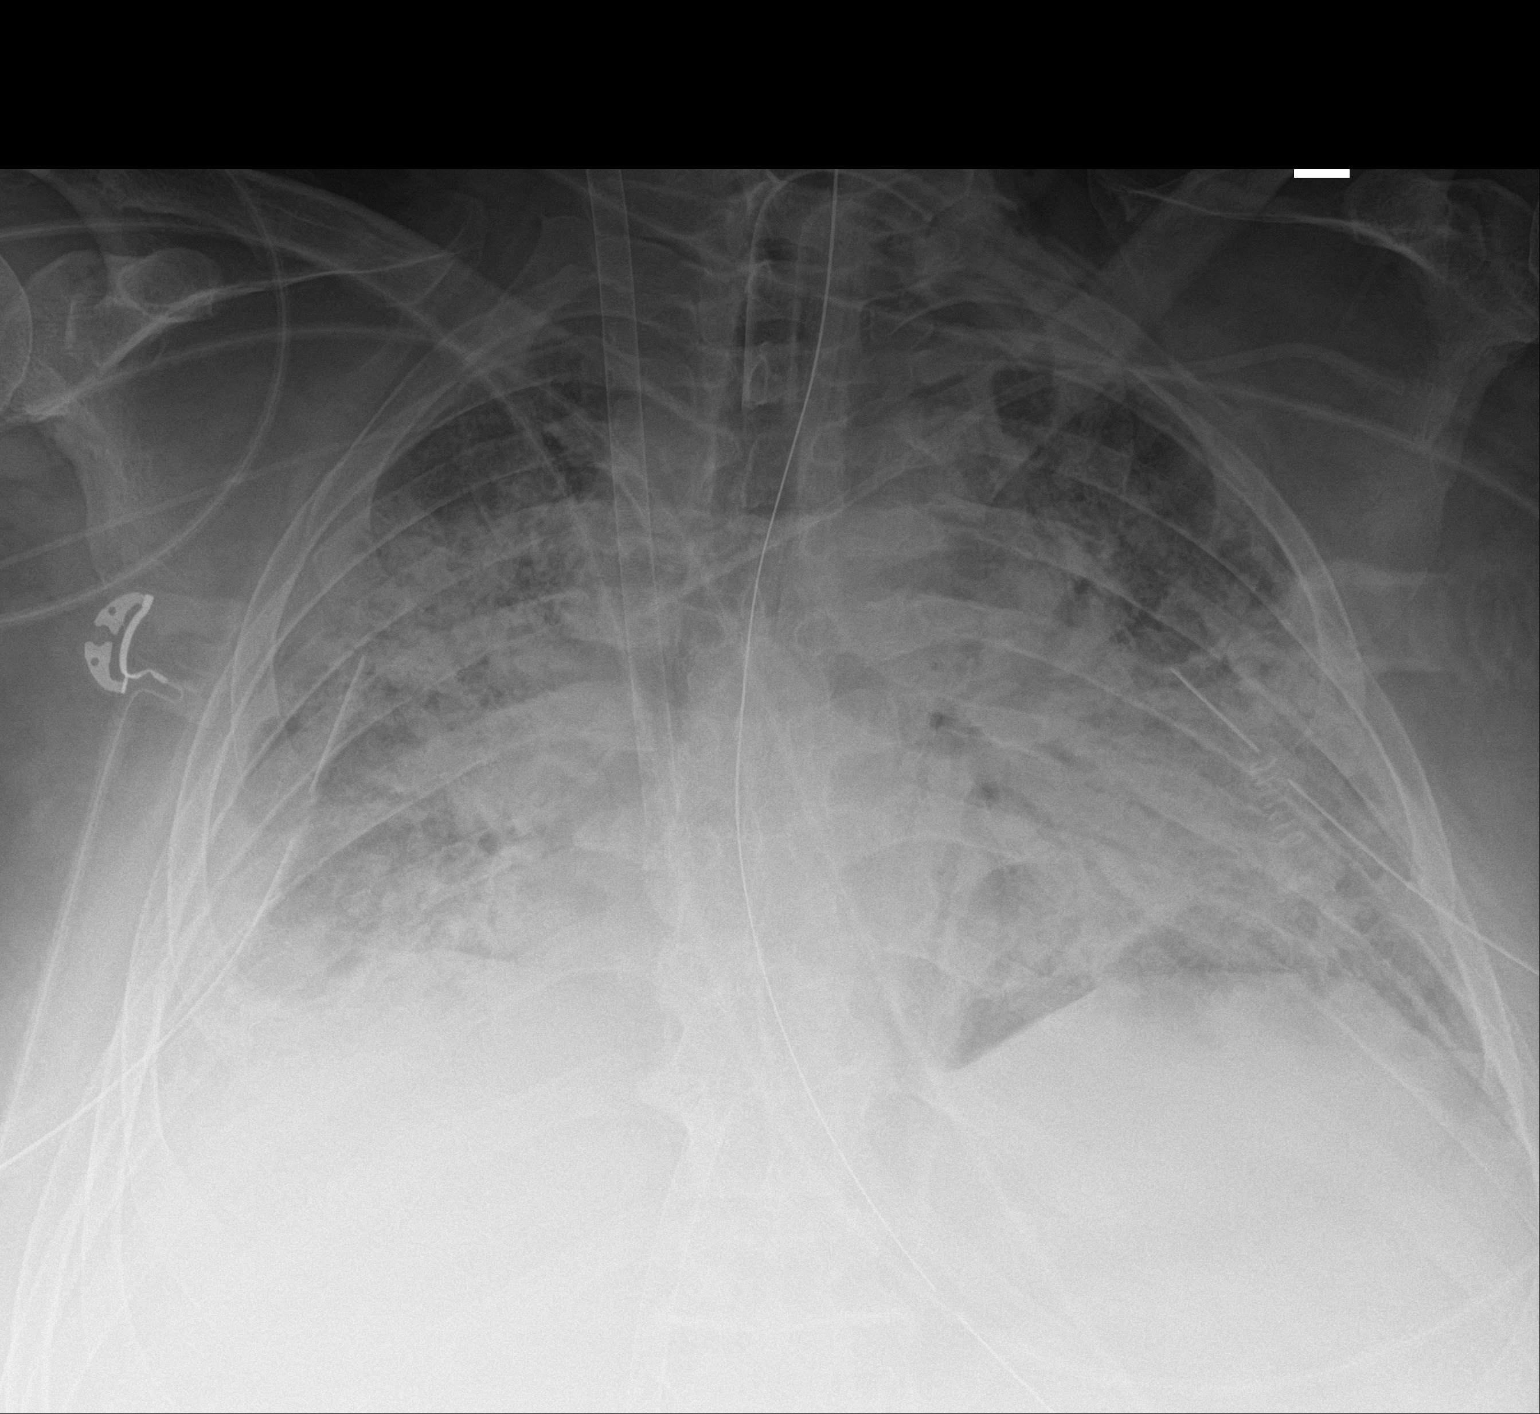

[1 of 1 positions shown; findings below may reference images not displayed]

FINDINGS: Tracheostomy tube tip remains at the thoracic inlet. Enteric tube
tip and side-port below the diaphragm in the stomach. Bilateral
chest tubes in place. Right internal jugular ECMO catheter tip the
right atrium. Inferior approach ECMO catheter at the IVC atrial
caval junction. Left subclavian central line remains in place. Right
upper extremity PICC remains in place. Improved right lung base
aeration from prior exam. Heterogeneous bilateral lung opacities are
otherwise unchanged with central air bronchograms on the left. No
visualized pneumothorax.
IMPRESSION: Improved right lung base aeration. Otherwise unchanged appearance of
the chest with diffuse bilateral lung opacities.

Stable support apparatus.

## 2021-11-10 IMAGING — DX DG CHEST 1V PORT
1 series · 1 of 1 positions shown · non-contrast
Comparison: 08/30/2019

CLINICAL DATA: ECMO.

EXAM:
PORTABLE CHEST 1 VIEW

[chest ap]
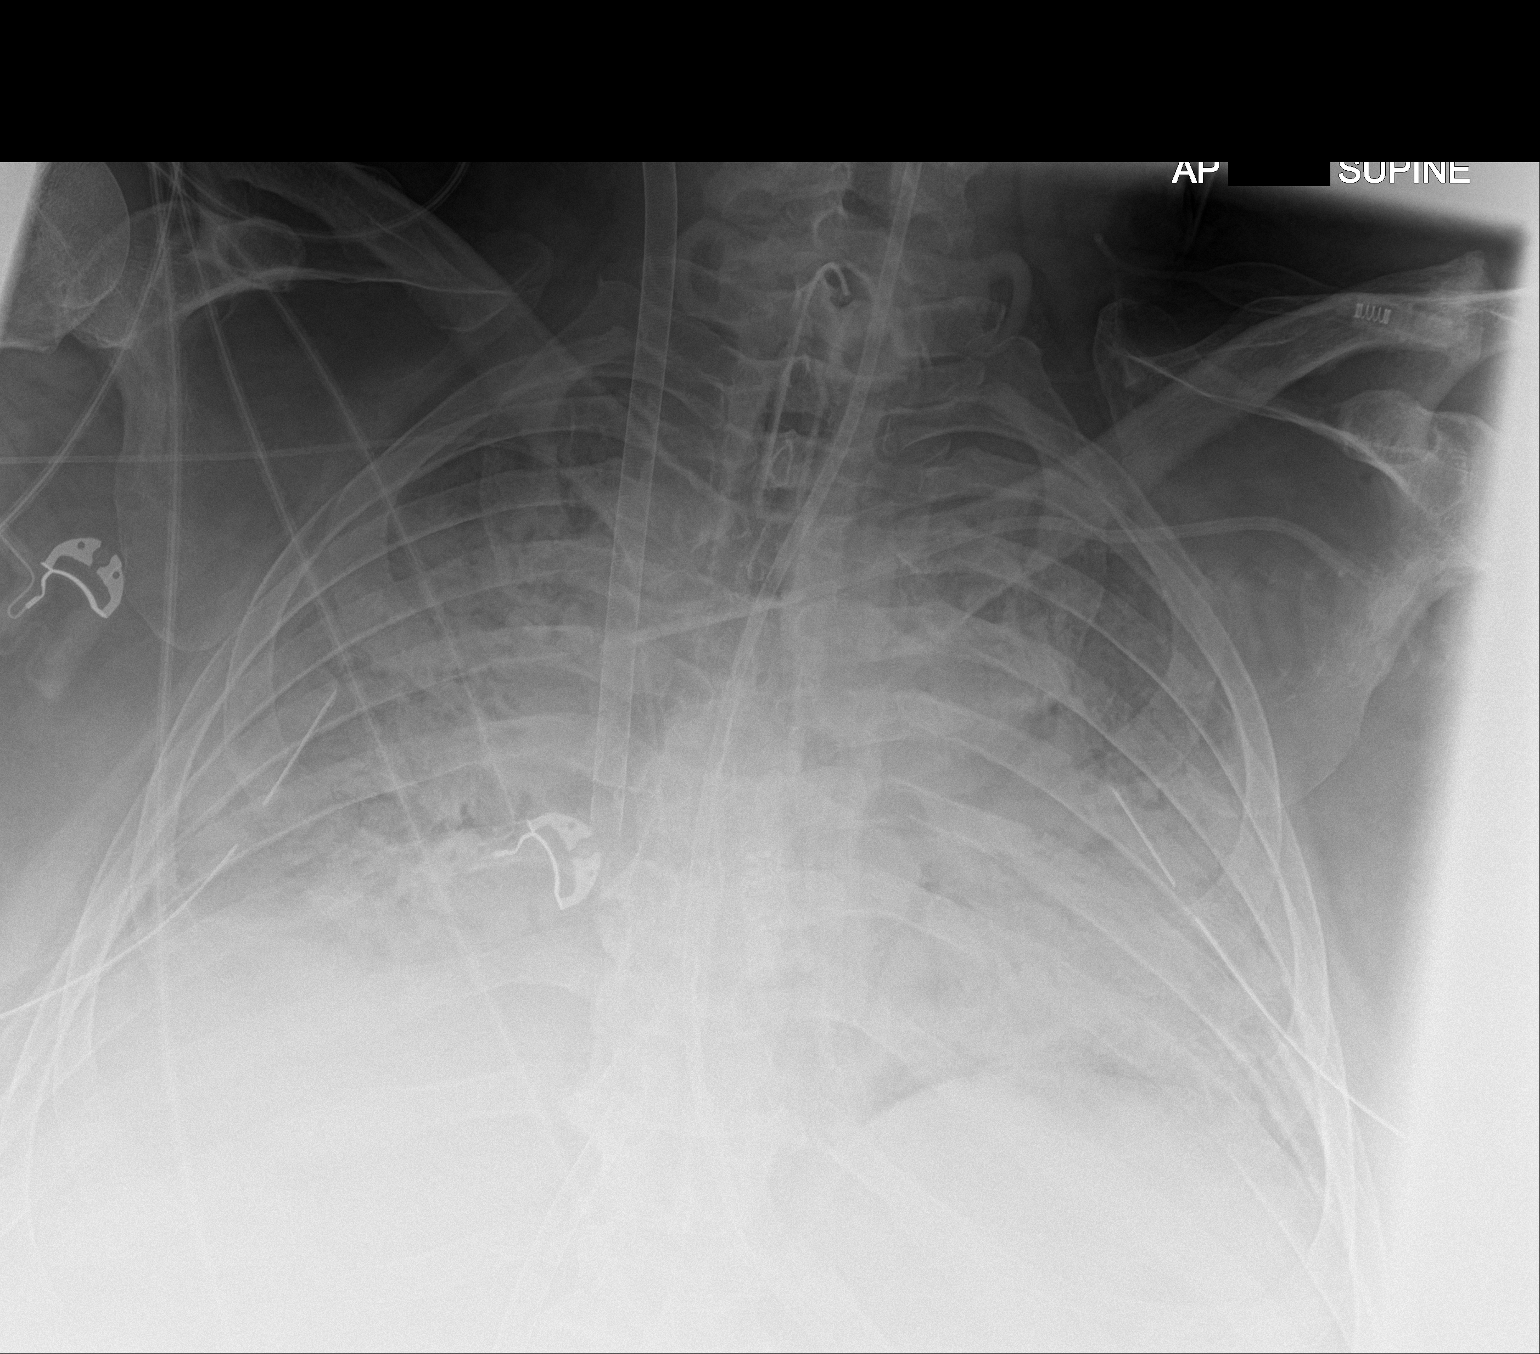

[1 of 1 positions shown; findings below may reference images not displayed]

FINDINGS: 9889 hours. Low lung volumes. The cardio pericardial silhouette is
enlarged. Diffuse bilateral airspace disease again noted,
progressive in the right lower lung. Tracheostomy tube remains in
place. Right IJ sheath again noted. Right PICC line tip overlies the
expected location of the distal SVC. A feeding tube passes into the
stomach although the distal tip position is not included on the
film. Left subclavian central line tip is overlying the innominate
vein confluence. Inferiorly placed cannula tip overlies the region
of the IVC/RA junction. Bilateral chest tubes remain in place.
IMPRESSION: 1. Progression of airspace disease in the right base on a background
of diffuse bilateral airspace opacity.
2. Support apparatus stable, as above.

## 2021-11-11 ENCOUNTER — Encounter (HOSPITAL_BASED_OUTPATIENT_CLINIC_OR_DEPARTMENT_OTHER): Payer: BC Managed Care – PPO | Attending: General Surgery | Admitting: General Surgery

## 2021-11-11 DIAGNOSIS — Z8616 Personal history of COVID-19: Secondary | ICD-10-CM | POA: Insufficient documentation

## 2021-11-11 DIAGNOSIS — L97512 Non-pressure chronic ulcer of other part of right foot with fat layer exposed: Secondary | ICD-10-CM | POA: Diagnosis not present

## 2021-11-11 DIAGNOSIS — I10 Essential (primary) hypertension: Secondary | ICD-10-CM | POA: Diagnosis not present

## 2021-11-11 DIAGNOSIS — L97522 Non-pressure chronic ulcer of other part of left foot with fat layer exposed: Secondary | ICD-10-CM | POA: Diagnosis not present

## 2021-11-11 IMAGING — DX DG CHEST 1V PORT
1 series · 1 of 1 positions shown · non-contrast
Comparison: 08/31/2019

CLINICAL DATA: [id number].  ECMO.  Chest tube

EXAM:
PORTABLE CHEST 1 VIEW

[chest ap]
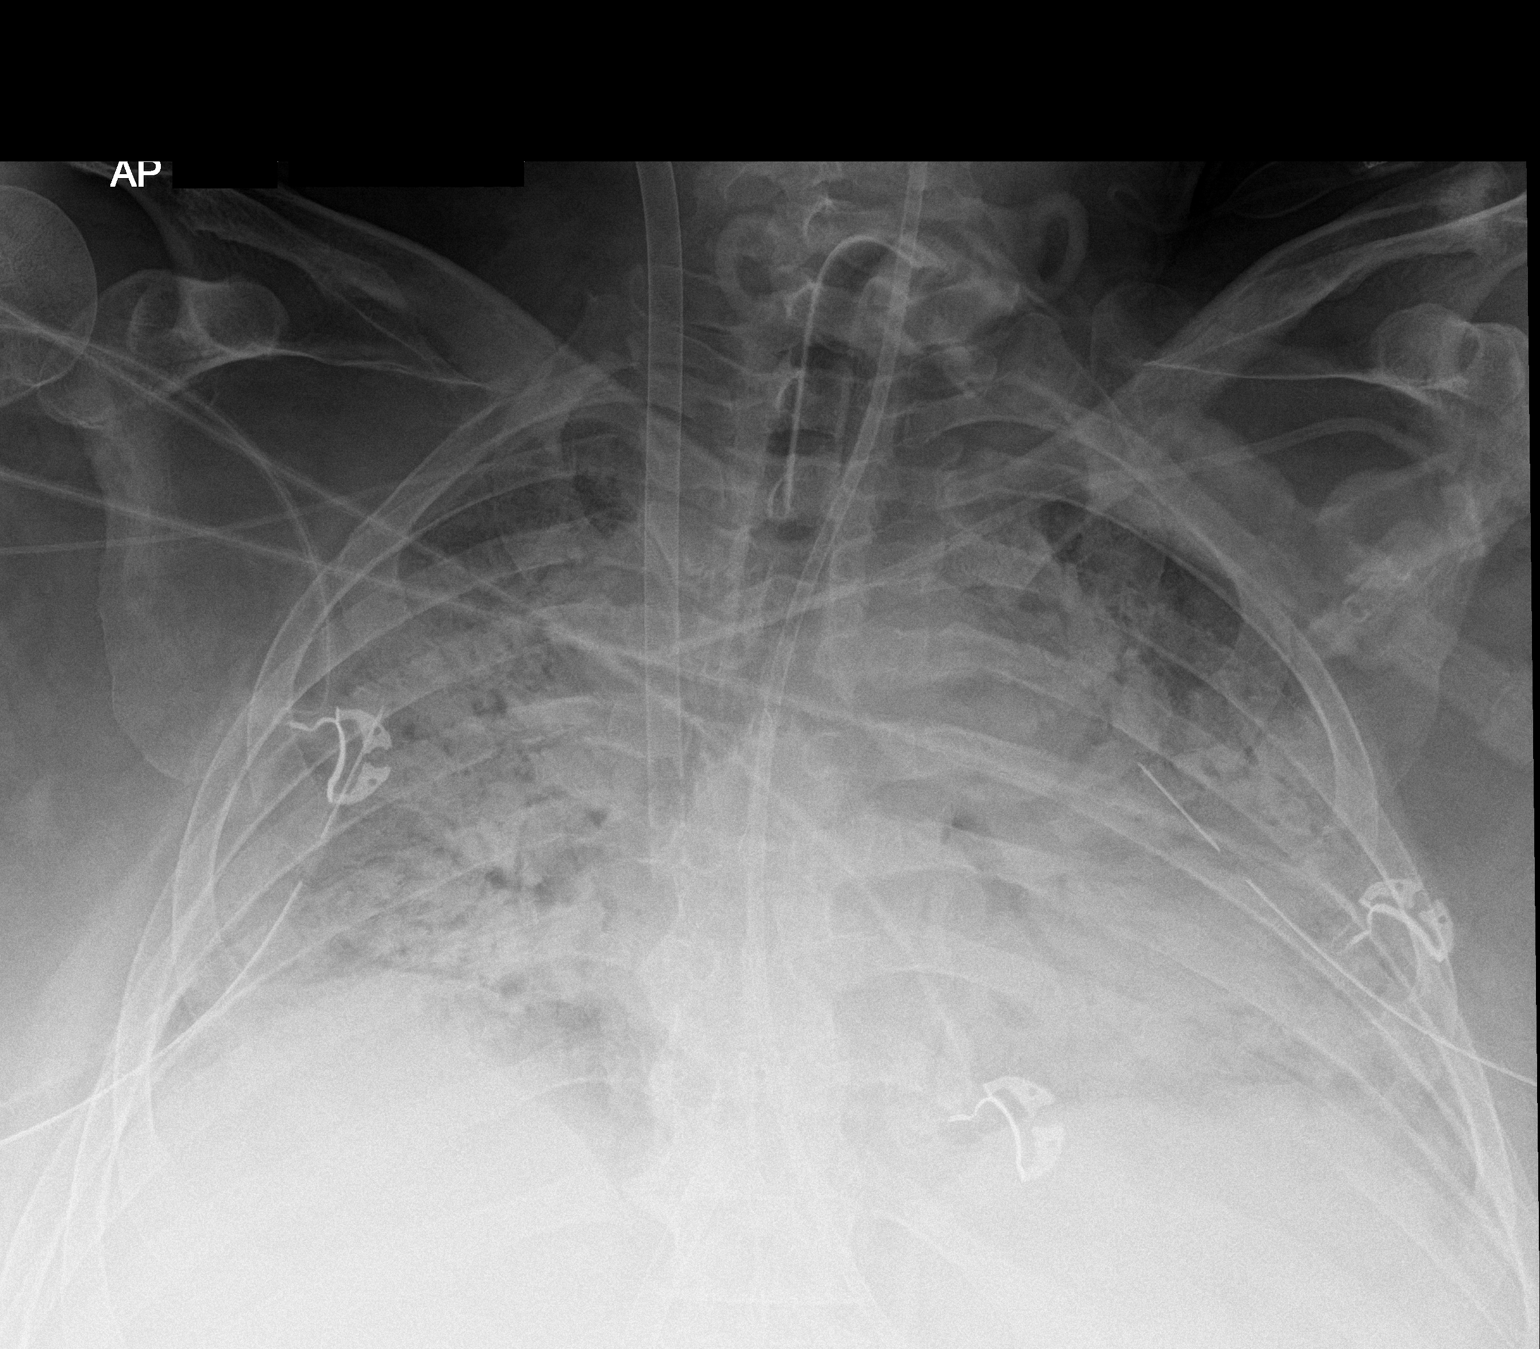

[1 of 1 positions shown; findings below may reference images not displayed]

FINDINGS: Tracheostomy in good position. Feeding tube enters the stomach with
the tip not visualized. Right jugular large-bore catheter extending
into the lower SVC. Inferior vena cava large bore catheter extending
into the cavoatrial junction.

Bilateral chest tubes. No pneumothorax. Severe bilateral airspace
disease. Slight improved lung volumes.
IMPRESSION: Severe bilateral airspace disease with slight improvement in lung
volumes.

Support lines unchanged.  No pneumothorax.

## 2021-11-12 IMAGING — DX DG CHEST 1V PORT
1 series · 1 of 1 positions shown · non-contrast
Comparison: Radiograph 09/01/2019

CLINICAL DATA: History of ECMO

EXAM:
PORTABLE CHEST 1 VIEW

[chest]
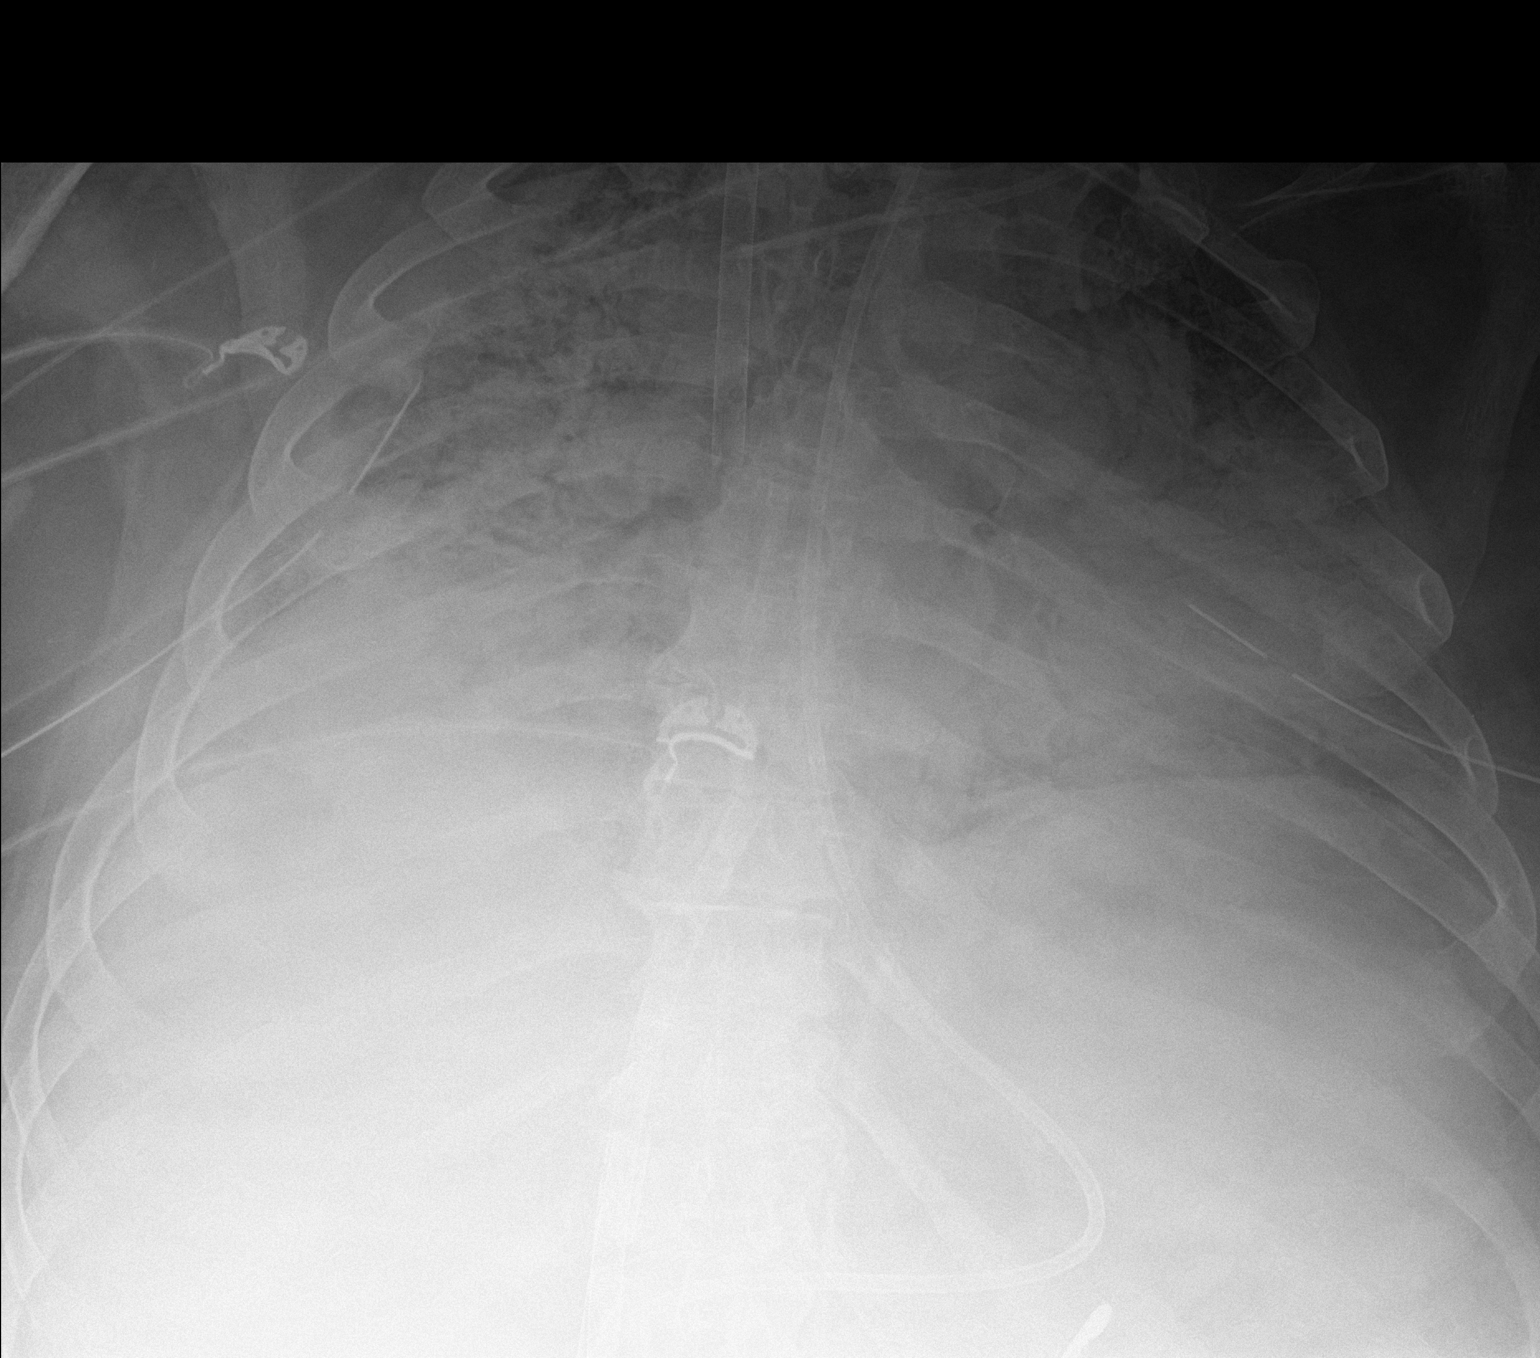

[1 of 1 positions shown; findings below may reference images not displayed]

FINDINGS: Tracheostomy tube tip terminates in the mid trachea. A
transesophageal feeding tube tip terminates in the upper abdomen
likely terminating near the 3rd to 4th portion of the duodenum.
Large bore central venous catheter sheath, likely ECMO cannulae
terminate over the superior cavoatrial junction and inferior
cavoatrial junction. Bilateral chest tubes in place. Left subclavian
central venous catheter tip terminates at the brachiocephalic-caval
confluence. Right upper extremity PICC terminates at the level of
the right atrium.

Lung volumes remain low, somewhat diminished in right with dense
bilateral airspace opacities and extensive air bronchograms again
seen throughout both lungs. Suspect at least small right pleural
effusion. No clear left effusion. No visible pneumothorax. No acute
osseous or soft tissue abnormality.
IMPRESSION: 1. Lines and tubes as above.
2. Persistent low lung volumes, diminishing in the right, with dense
bilateral airspace opacities and extensive air bronchograms
throughout both lungs.
3. Suspect at least small right pleural effusion.

## 2021-11-12 NOTE — Progress Notes (Signed)
SYDNEY, AZURE ([id number]) ?Visit Report for 11/11/2021 ?Arrival Information Details ?Patient Name: Date of Service: ?[name], [name] 11/11/2021 9:30 A M ?Medical Record Number: [id number] ?Patient Account Number: [id number] ?Date of Birth/Sex: Treating RN: ?[birth year] (46 y.o. [name]) [name] ?Primary Care Dailin Sosnowski: [name] Other Clinician: ?Referring Samira Acero: ?Treating Heylee Tant/Extender: Fredirick Maudlin ?[name] ?Weeks in Treatment: 57 ?Visit Information History Since Last Visit ?Added or deleted any medications: No ?Patient Arrived: Wheel Chair ?Any new allergies or adverse reactions: No ?Arrival Time: 09:35 ?Had a fall or experienced change in No ?Accompanied By: son ?activities of daily living that may affect ?Transfer Assistance: None ?risk of falls: ?Patient Identification Verified: Yes ?Signs or symptoms of abuse/neglect since last visito No ?Patient Requires Transmission-Based Precautions: No ?Hospitalized since last visit: No ?Patient Has Alerts: No ?Implantable device outside of the clinic excluding No ?cellular tissue based products placed in the center ?since last visit: ?Has Dressing in Place as Prescribed: Yes ?Has Compression in Place as Prescribed: Yes ?Pain Present Now: No ?Electronic Signature(s) ?Signed: 11/12/2021 7:27:26 AM By: [name] ?Entered By: [name] on 11/11/2021 09:35:51 ?-------------------------------------------------------------------------------- ?Encounter Discharge Information Details ?Patient Name: Date of Service: ?[name], [name] 11/11/2021 9:30 A M ?Medical Record Number: [id number] ?Patient Account Number: [id number] ?Date of Birth/Sex: Treating RN: ?07/17/1976 (46 y.o. [name]) [name] ?Primary Care Retia Cordle: [name] Other Clinician: ?Referring Latessa Tillis: ?Treating Derren Suydam/Extender: Fredirick Maudlin ?[name] ?Weeks in Treatment: 95 ?Encounter Discharge Information Items Post Procedure Vitals ?Discharge Condition: Stable ?Temperature (F):  98 ?Ambulatory Status: Wheelchair ?Pulse (bpm): 77 ?Discharge Destination: Home ?Respiratory Rate (breaths/min): 18 ?Transportation: Private Auto ?Blood Pressure (mmHg): 150/86 ?Accompanied By: spouse and child ?Schedule Follow-up Appointment: Yes ?Clinical Summary of Care: Patient Declined ?Electronic Signature(s) ?Signed: 11/12/2021 7:27:26 AM By: [name] ?Entered By: [name] on 11/12/2021 07:27:04 ?-------------------------------------------------------------------------------- ?Lower Extremity Assessment Details ?Patient Name: ?Date of Service: ?[name], [name] E. 11/11/2021 9:30 A M ?Medical Record Number: [id number] ?Patient Account Number: [id number] ?Date of Birth/Sex: ?Treating RN: ?Jul 01, 1976 (46 y.o. [name]) [name] ?Primary Care Jora Galluzzo: [name] ?Other Clinician: ?Referring Diahann Guajardo: ?Treating Mylez Venable/Extender: Fredirick Maudlin ?[name] ?Weeks in Treatment: 28 ?Edema Assessment ?Assessed: [Left: No] [Right: No] ?Edema: [Left: No] [Right: No] ?Calf ?Left: Right: ?Point of Measurement: 29 cm From Medial Instep 40 cm 44 cm ?Ankle ?Left: Right: ?Point of Measurement: 9 cm From Medial Instep 25 cm 24 cm ?Electronic Signature(s) ?Signed: 11/12/2021 7:27:26 AM By: [name] ?Entered By: [name] on 11/11/2021 09:43:52 ?-------------------------------------------------------------------------------- ?Multi Wound Chart Details ?Patient Name: ?Date of Service: ?[name], [name] 11/11/2021 9:30 A M ?Medical Record Number: [id number] ?Patient Account Number: [id number] ?Date of Birth/Sex: ?Treating RN: ?Jul 24, 1976 (46 y.o. [name]) [name] ?Primary Care Halli Equihua: [name] ?Other Clinician: ?Referring Makenley Shimp: ?Treating Doron Shake/Extender: Fredirick Maudlin ?[name] ?Weeks in Treatment: 17 ?Vital Signs ?Height(in): 69 ?Pulse(bpm): 77 ?Weight(lbs): 280 ?Blood Pressure(mmHg): 150/86 ?Body Mass Index(BMI): 41.3 ?Temperature(??F): 98 ?Respiratory Rate(breaths/min): 18 ?Photos:  [N/A:N/A] ?Right Calcaneus Left Calcaneus N/A ?Wound Location: ?Pressure Injury Pressure Injury N/A ?Wounding Event: ?Pressure Ulcer Pressure Ulcer N/A ?Primary Etiology: ?Asthma, Angina, Hypertension Asthma, Angina, Hypertension N/A ?Comorbid History: ?10/07/2019 10/07/2019 N/A ?Date Acquired: ?36 69 N/A ?Weeks of Treatment: ?Open Open N/A ?Wound Status: ?No No N/A ?Wound Recurrence: ?1.3x0.3x0.3 3.2x0.8x0.3 N/A ?Measurements L x W x D (cm) ?0.306 2.011 N/A ?A (cm?) : ?rea ?0.092 0.603 N/A ?Volume (cm?) : ?99.00% 92.60% N/A ?% Reduction in A rea: ?99.70% 97.80% N/A ?% Reduction  in Volume: ?Category/Stage III Category/Stage III N/A ?Classification: ?Medium Medium N/A ?Exudate A mount: ?Serosanguineous Serosanguineous N/A ?Exudate Type: ?red, brown red, brown N/A ?Exudate Color: ?Distinct, outline attached Distinct, outline attached N/A ?Wound Margin: ?Medium (34-66%) Medium (34-66%) N/A ?Granulation A mount: ?Pink Red N/A ?Granulation Quality: ?Medium (34-66%) Medium (34-66%) N/A ?Necrotic A mount: ?Fat Layer (Subcutaneous Tissue): Yes Fat Layer (Subcutaneous Tissue): Yes N/A ?Exposed Structures: ?Fascia: No ?Fascia: No ?Tendon: No ?Tendon: No ?Muscle: No ?Muscle: No ?Joint: No ?Joint: No ?Bone: No ?Bone: No ?Small (1-33%) Small (1-33%) N/A ?Epithelialization: ?Debridement - Excisional Debridement - Excisional N/A ?Debridement: ?Pre-procedure Verification/Time Out 10:16 10:16 N/A ?Taken: ?Lidocaine 5% topical ointment Lidocaine 5% topical ointment N/A ?Pain Control: ?Callus, Subcutaneous, Slough Callus, Subcutaneous, Slough N/A ?Tissue Debrided: ?Skin/Subcutaneous Tissue Skin/Subcutaneous Tissue N/A ?Level: ?0.39 2.56 N/A ?Debridement A (sq cm): ?rea ?Curette Curette N/A ?Instrument: ?Minimum Minimum N/A ?Bleeding: ?Pressure Pressure N/A ?Hemostasis A chieved: ?0 0 N/A ?Procedural Pain: ?0 0 N/A ?Post Procedural Pain: ?Procedure was tolerated well Procedure was tolerated well N/A ?Debridement Treatment  Response: ?1.3x0.3x0.3 3.2x0.8x0.3 N/A ?Post Debridement Measurements L x ?W x D (cm) ?0.092 0.603 N/A ?Post Debridement Volume: (cm?) ?Category/Stage III Category/Stage III N/A ?Post Debridement Stage: ?Debridement Debridement N/A ?Procedures Performed: ?Treatment Notes ?Electronic Signature(s) ?Signed: 11/11/2021 11:19:49 AM By: Fredirick Maudlin MD FACS ?Signed: 11/12/2021 7:27:26 AM By: [name] ?Entered By: Fredirick Maudlin on 11/11/2021 11:19:49 ?-------------------------------------------------------------------------------- ?Multi-Disciplinary Care Plan Details ?Patient Name: ?Date of Service: ?[name], [place] E. 11/11/2021 9:30 A M ?Medical Record Number: [id number] ?Patient Account Number: [id number] ?Date of Birth/Sex: ?Treating RN: ?Jan 29, 1976 (46 y.o. [name]) [name] ?Primary Care Raja Caputi: [name] ?Other Clinician: ?Referring Makeyla Govan: ?Treating Hae Ahlers/Extender: Fredirick Maudlin ?[name] ?Weeks in Treatment: 33 ?Multidisciplinary Care Plan reviewed with physician ?Active Inactive ?Wound/Skin Impairment ?Nursing Diagnoses: ?Impaired tissue integrity ?Knowledge deficit related to ulceration/compromised skin integrity ?Goals: ?Patient/caregiver will verbalize understanding of skin care regimen ?Date Initiated: 07/15/2020 ?Target Resolution Date: 12/03/2021 ?Goal Status: Active ?Ulcer/skin breakdown will have a volume reduction of 30% by week 4 ?Date Initiated: 07/15/2020 ?Date Inactivated: 08/20/2020 ?Target Resolution Date: 09/03/2020 ?Goal Status: Unmet ?Unmet Reason: no major changes. ?Ulcer/skin breakdown will heal within 14 weeks ?Date Initiated: 12/04/2020 ?Date Inactivated: 12/10/2020 ?Target Resolution Date: 12/10/2020 ?Unmet Reason: wounds still open at 14 ?Goal Status: Unmet ?weeks and today 21 weeks. ?Interventions: ?Assess patient/caregiver ability to obtain necessary supplies ?Assess patient/caregiver ability to perform ulcer/skin care regimen upon admission and as needed ?Assess  ulceration(s) every visit ?Provide education on ulcer and skin care ?Treatment Activities: ?Skin care regimen initiated : 07/15/2020 ?Topical wound management initiated : 07/15/2020 ?Notes: ?Electronic Signature(s) ?Signed: 4/7/

## 2021-11-12 NOTE — Progress Notes (Signed)
MUNG, RINKER ([id number]) ?Visit Report for 11/11/2021 ?Chief Complaint Document Details ?Patient Name: Date of Service: ?[name], [place] 11/11/2021 9:30 A M ?Medical Record Number: [id number] ?Patient Account Number: [id number] ?Date of Birth/Sex: Treating RN: ?[birth year] (46 y.o. [name]) [name] ?Primary Care Provider: [name] Other Clinician: ?Referring Provider: ?Treating Provider/Extender: Fredirick Maudlin ?[name] ?Weeks in Treatment: 2 ?Information Obtained from: Patient ?Chief Complaint ?Bilateral Plantar Foot Ulcers ?Electronic Signature(s) ?Signed: 11/11/2021 11:20:36 AM By: Fredirick Maudlin MD FACS ?Entered By: Fredirick Maudlin on 11/11/2021 11:20:35 ?-------------------------------------------------------------------------------- ?Debridement Details ?Patient Name: Date of Service: ?[name], [place] 11/11/2021 9:30 A M ?Medical Record Number: [id number] ?Patient Account Number: [id number] ?Date of Birth/Sex: Treating RN: ?1975/09/11 (46 y.o. [name]) [name] ?Primary Care Provider: [name] Other Clinician: ?Referring Provider: ?Treating Provider/Extender: Fredirick Maudlin ?[name] ?Weeks in Treatment: 7 ?Debridement Performed for Assessment: Wound #1 Right Calcaneus ?Performed By: Physician Fredirick Maudlin, MD ?Debridement Type: Debridement ?Level of Consciousness (Pre-procedure): Awake and Alert ?Pre-procedure Verification/Time Out Yes - 10:16 ?Taken: ?Start Time: 10:16 ?Pain Control: Lidocaine 5% topical ointment ?T Area Debrided (L x W): ?otal 1.3 (cm) x 0.3 (cm) = 0.39 (cm?) ?Tissue and other material debrided: Non-Viable, Callus, Slough, Subcutaneous, Slough ?Level: Skin/Subcutaneous Tissue ?Debridement Description: Excisional ?Instrument: Curette ?Bleeding: Minimum ?Hemostasis Achieved: Pressure ?End Time: 10:18 ?Procedural Pain: 0 ?Post Procedural Pain: 0 ?Response to Treatment: Procedure was tolerated well ?Level of Consciousness (Post- Awake and Alert ?procedure): ?Post  Debridement Measurements of Total Wound ?Length: (cm) 1.3 ?Stage: Category/Stage III ?Width: (cm) 0.3 ?Depth: (cm) 0.3 ?Volume: (cm?) 0.092 ?Character of Wound/Ulcer Post Debridement: Improved ?Post Procedure Diagnosis ?Same as Pre-procedure ?Electronic Signature(s) ?Signed: 11/11/2021 5:21:29 PM By: Fredirick Maudlin MD FACS ?Signed: 11/12/2021 7:27:26 AM By: [name] ?Entered By: [name] on 11/11/2021 10:19:40 ?-------------------------------------------------------------------------------- ?Debridement Details ?Patient Name: ?Date of Service: ?[name], [place] 11/11/2021 9:30 A M ?Medical Record Number: [id number] ?Patient Account Number: [id number] ?Date of Birth/Sex: ?Treating RN: ?05-10-1976 (46 y.o. [name]) [name] ?Primary Care Provider: [name] ?Other Clinician: ?Referring Provider: ?Treating Provider/Extender: Fredirick Maudlin ?[name] ?Weeks in Treatment: 3 ?Debridement Performed for Assessment: Wound #2 Left Calcaneus ?Performed By: Physician Fredirick Maudlin, MD ?Debridement Type: Debridement ?Level of Consciousness (Pre-procedure): Awake and Alert ?Pre-procedure Verification/Time Out Yes - 10:16 ?Taken: ?Start Time: 10:16 ?Pain Control: Lidocaine 5% topical ointment ?T Area Debrided (L x W): ?otal 3.2 (cm) x 0.8 (cm) = 2.56 (cm?) ?Tissue and other material debrided: Non-Viable, Callus, Slough, Subcutaneous, Slough ?Level: Skin/Subcutaneous Tissue ?Debridement Description: Excisional ?Instrument: Curette ?Bleeding: Minimum ?Hemostasis Achieved: Pressure ?End Time: 10:18 ?Procedural Pain: 0 ?Post Procedural Pain: 0 ?Response to Treatment: Procedure was tolerated well ?Level of Consciousness (Post- Awake and Alert ?procedure): ?Post Debridement Measurements of Total Wound ?Length: (cm) 3.2 ?Stage: Category/Stage III ?Width: (cm) 0.8 ?Depth: (cm) 0.3 ?Volume: (cm?) 0.603 ?Character of Wound/Ulcer Post Debridement: Improved ?Post Procedure Diagnosis ?Same as Pre-procedure ?Electronic  Signature(s) ?Signed: 11/11/2021 5:21:29 PM By: Fredirick Maudlin MD FACS ?Signed: 11/12/2021 7:27:26 AM By: [name] ?Entered By: [name] on 11/11/2021 10:20:33 ?-------------------------------------------------------------------------------- ?HPI Details ?Patient Name: ?Date of Service: ?[name], [place]. 11/11/2021 9:30 A M ?Medical Record Number: [id number] ?Patient Account Number: [id number] ?Date of Birth/Sex: ?Treating RN: ?[birth year] (46 y.o. [name]) [name] ?Primary Care Provider: [name] Other Clinician: ?Referring Provider: ?Treating Provider/Extender: Fredirick Maudlin ?[name] ?Weeks in Treatment: 60 ?History of Present Illness ?HPI Description: Wounds are12/03/2020 upon evaluation today patient presents for initial inspection here  in our clinic concerning issues he has been having ?with the bottoms of his feet bilaterally. He states these actually occurred as wounds when he was hospitalized for 5 months secondary to Covid. He was ?apparently with tilting bed where he was in an upright position quite frequently and apparently this occurred in some way shape or form during that time. ?Fortunately there is no sign of active infection at this time. No fevers, chills, nausea, vomiting, or diarrhea. With that being said he still has substantial wounds ?on the plantar aspects of his feet Theragen require quite a bit of work to get these to heal. He has been using Santyl currently though that is been problematic ?both in receiving the medication as well as actually paid for it as it is become quite expensive. Prior to the experience with Covid the patient really did not have ?any major medical problems other than hypertension he does have some mild generalized weakness following the Covid experience. ?07/22/2020 on evaluation today patient appears to be doing okay in regard to his foot ulcers I feel like the wound beds are showing signs of better ?improvement that I do believe the Iodoflex is  helping in this regard. With that being said he does have a lot of drainage currently and this is somewhat ?blue/green in nature which is consistent with Pseudomonas. I do think a culture today would be appropriate for [place] to evaluate and see if that is indeed the case ?I would likely start him on antibiotic orally as well he is not allergic to Cipro knows of no issues he has had in the past ?12/21; patient was admitted to the clinic earlier this month with bilateral presumed pressure ulcers on the bottom of his feet apparently related to excessive ?pressure from a tilt table arrangement in the intensive care unit. Patient relates this to being on ECMO but I am not really sure that is exactly related to that. I ?must say I have never seen anything like this. He has fairly extensive full-thickness wounds extending from his heel towards his midfoot mostly centered ?laterally. There is already been some healing distally. He does not appear to have an arterial issue. He has been using gentamicin to the wound surfaces with ?Iodoflex to help with ongoing debridement ?1/6; this is a patient with pressure ulcers on the bottom of his feet related to excessive pressure from a standing position in the intensive care unit. He is ?complaining of a lot of pain in the right heel. He is not a diabetic. 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 We have been using ?Iodoflex to help prepare the surfaces of both wounds for an advanced treatment product. He is nonambulatory spending most of his time in a wheelchair I ?have asked him not to propel the wheelchair with his heels ?1/13; in general his wounds look better not much surface area change we have been using Iodoflex as of last week. ?I did an x-ray of the right heel as the patient was complaining of pain. I had some thoughts about a stress fracture perhaps Achilles tendon problems however ?what it showed was erosive changes along the inferior aspect of the  calcaneus he now has a MRI booked for 1/20. ?1/20; in general his wounds continue to be better. Some improvement in the large narrow areas proximally in his foot. He is still complaining of pain in the right

## 2021-11-13 IMAGING — DX DG CHEST 1V PORT
1 series · 1 of 1 positions shown · non-contrast
Comparison: Earlier same day

CLINICAL DATA: SVC cannula repositioned

EXAM:
PORTABLE CHEST 1 VIEW

[chest ap]
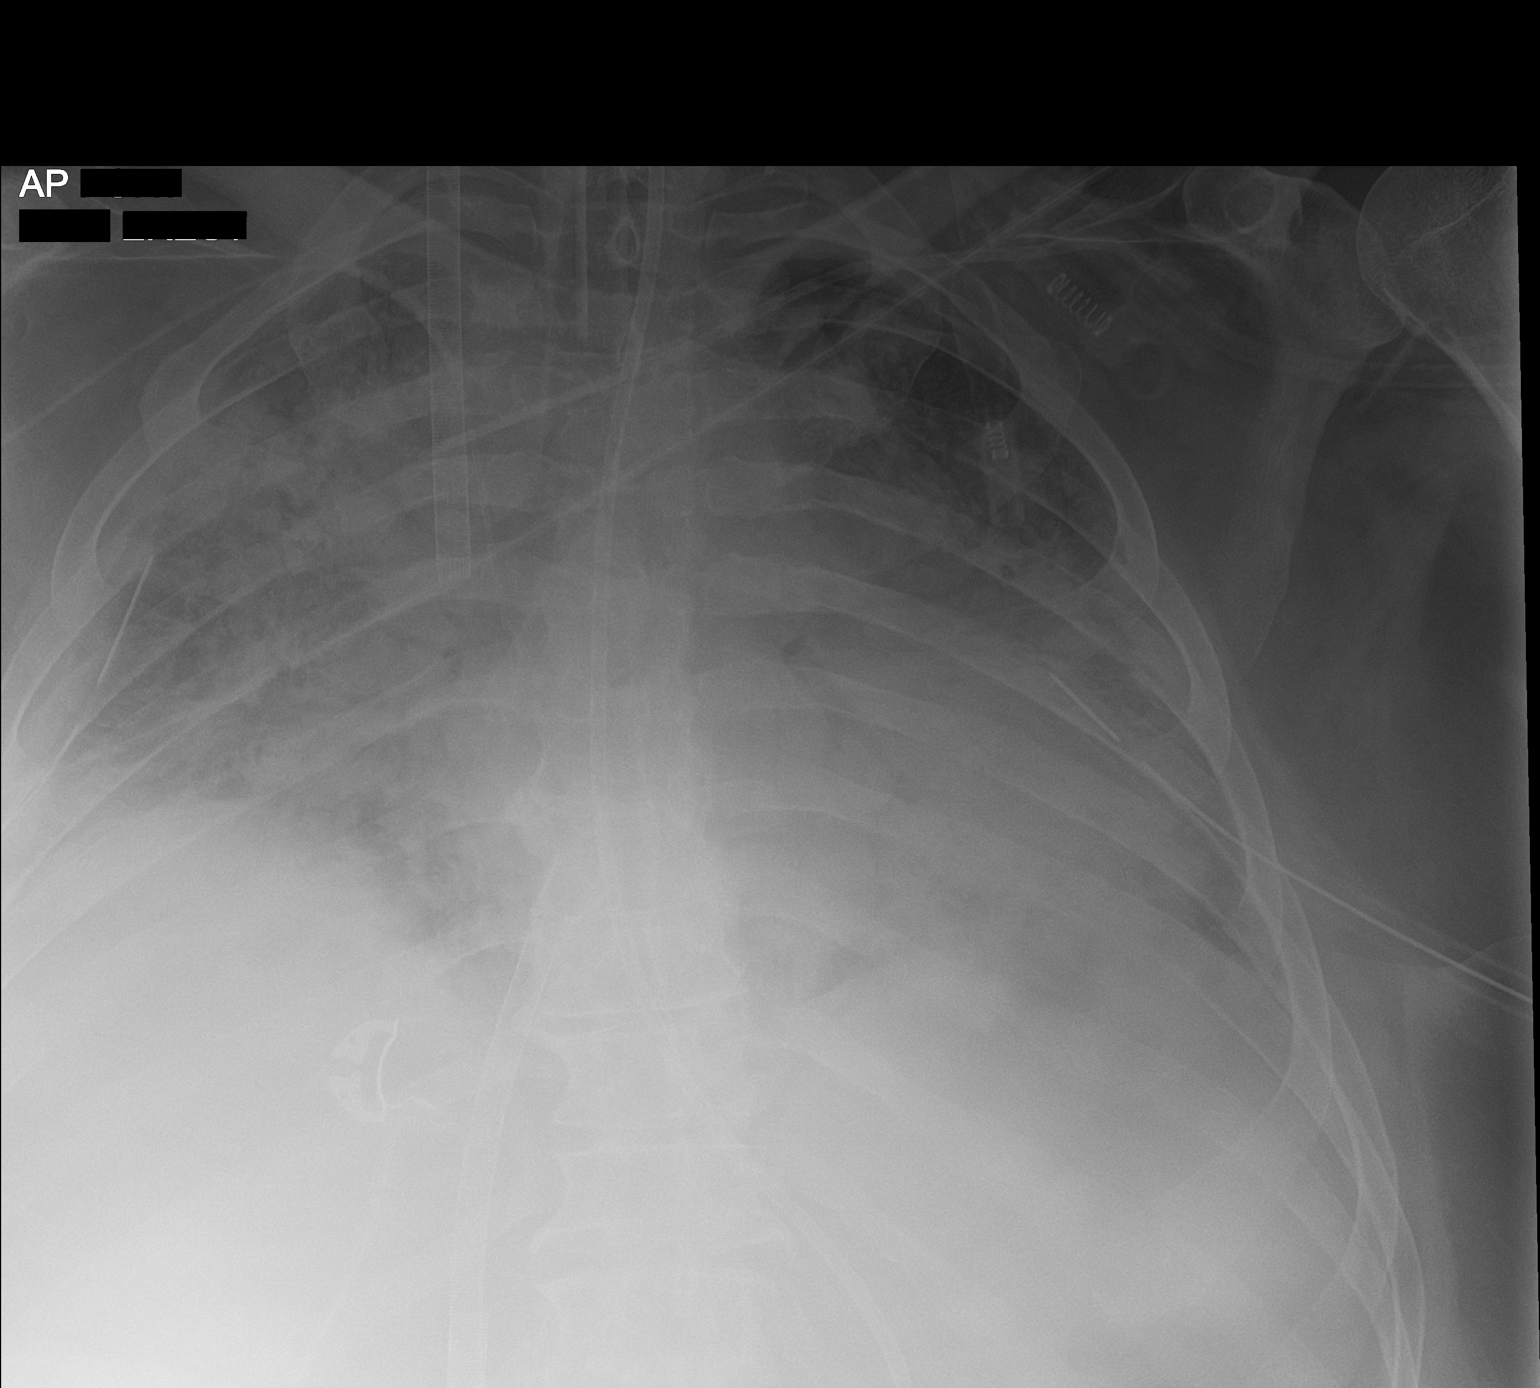

[1 of 1 positions shown; findings below may reference images not displayed]

FINDINGS: Right SVC ECMO cannula appears to have been slightly retracted. IVC
cannula is similar. Enteric tube passes into the stomach with tip
out of field of view. Endotracheal tube is in similar position. Left
subclavian line is unchanged. Bilateral chest tubes are again seen.

Persistent diffuse pulmonary opacities. No pneumothorax.
Cardiomediastinal silhouette is obscured. No definite pleural
effusion.
IMPRESSION: SVC ECMO cannula may have been slightly retracted. Otherwise stable
lines and tubes.

Persistent diffuse pulmonary opacities.

## 2021-11-13 IMAGING — DX DG CHEST 1V PORT
1 series · 1 of 1 positions shown · non-contrast
Comparison: Radiographs 09/02/2019, 09/01/2019

CLINICAL DATA: ETT, ECMO, low saturations

EXAM:
PORTABLE CHEST 1 VIEW

[chest ap]
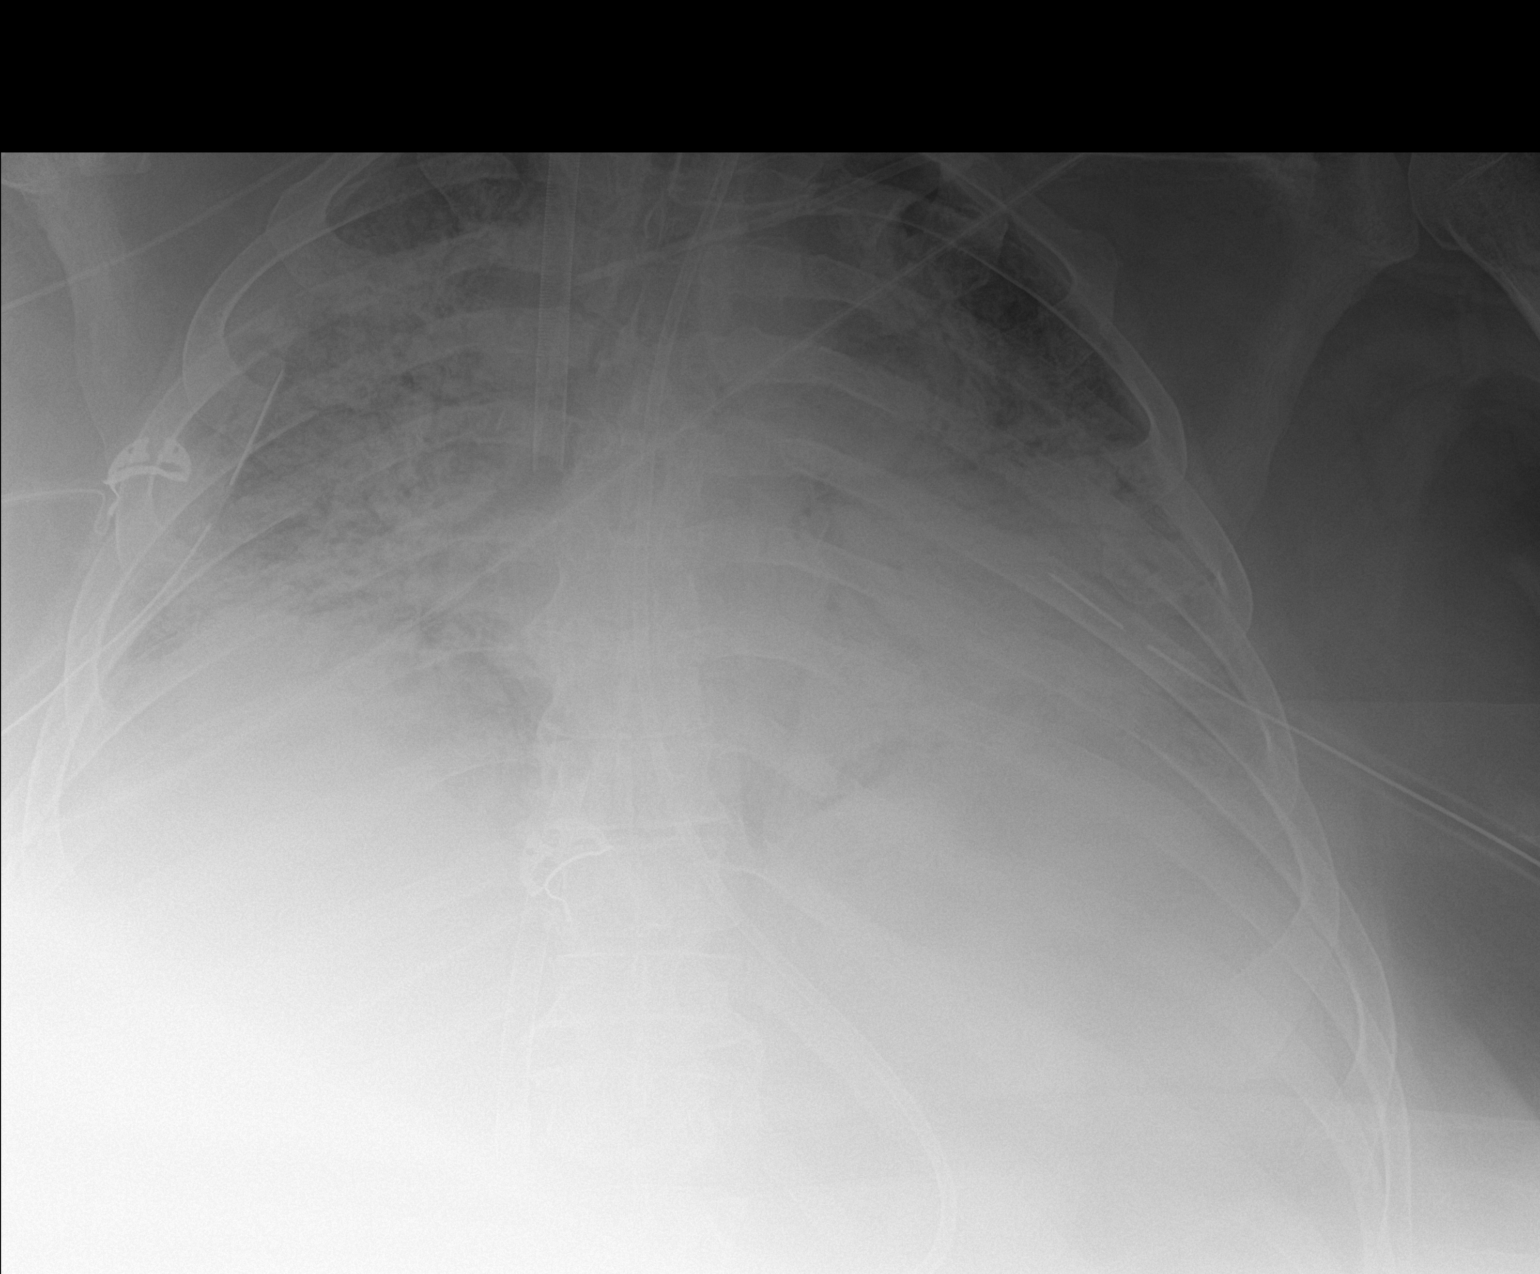

[1 of 1 positions shown; findings below may reference images not displayed]

FINDINGS: Large bore central venous catheters are present in the SVC and IVC
in stable position to comparison. A transesophageal tube terminates
below the level of imaging. An endotracheal tube terminates in the
mid trachea, 4.2 cm from the carina. A left subclavian venous
catheter tip terminates at the brachiocephalic-caval confluence.
Bilateral chest tubes remain in place.

Diffuse bilateral airspace opacities with persistently low lung
volumes are grossly similar to comparison. Much of the cardiac
silhouette is obscured by overlying opacity. Small effusion may be
present on the right. No convincing left effusion. No pneumothorax.
IMPRESSION: 1. Lines and tubes in stable position
2. Diffuse bilateral airspace opacities with low lung volumes are
grossly similar to comparison.
3. Possible trace right effusion

## 2021-11-14 DIAGNOSIS — Z741 Need for assistance with personal care: Secondary | ICD-10-CM | POA: Diagnosis not present

## 2021-11-14 DIAGNOSIS — M6281 Muscle weakness (generalized): Secondary | ICD-10-CM | POA: Diagnosis not present

## 2021-11-14 DIAGNOSIS — M6259 Muscle wasting and atrophy, not elsewhere classified, multiple sites: Secondary | ICD-10-CM | POA: Diagnosis not present

## 2021-11-14 DIAGNOSIS — G7281 Critical illness myopathy: Secondary | ICD-10-CM | POA: Diagnosis not present

## 2021-11-14 IMAGING — CR DG CHEST 1V PORT
1 series · 1 of 1 positions shown · non-contrast
Comparison: none

CLINICAL DATA: Check support apparatus position

EXAM:
PORTABLE CHEST 1 VIEW

[AP]
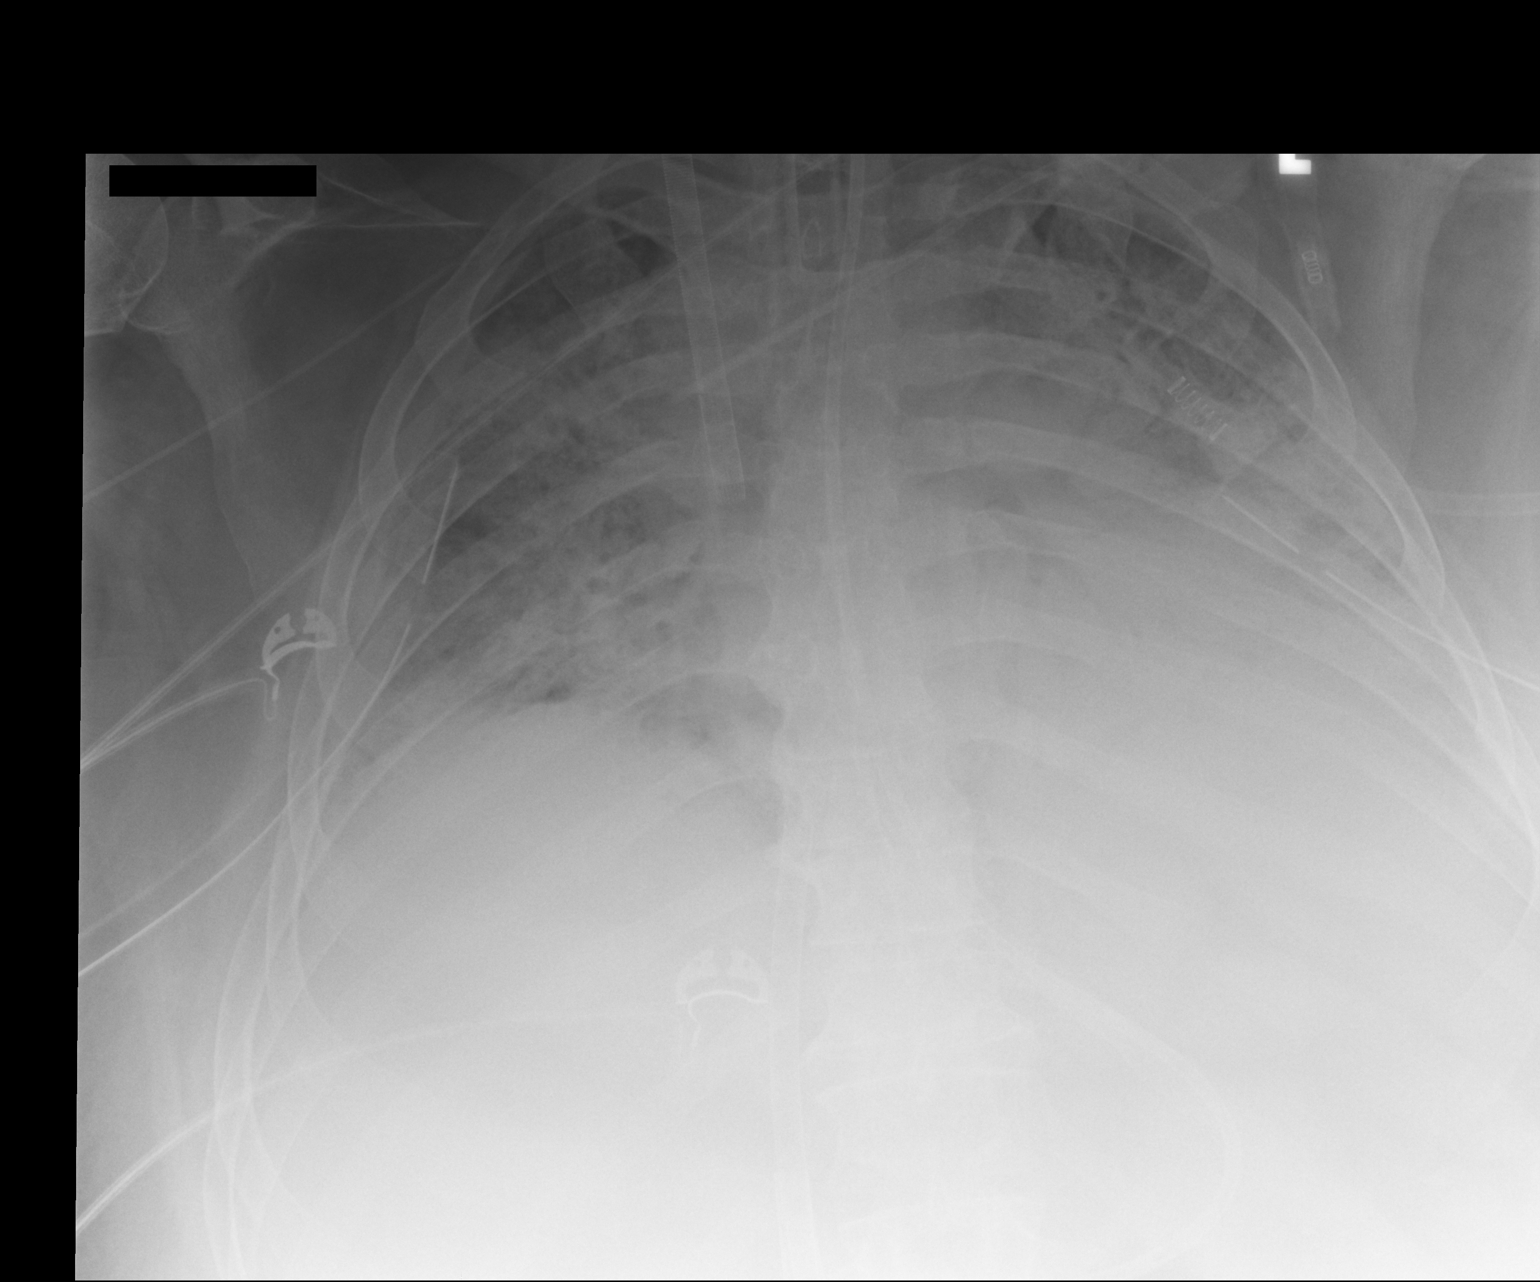

[1 of 1 positions shown; findings below may reference images not displayed]

FINDINGS: Support devices including bilateral chest tubes, endotracheal tube,
feeding tube, right PICC line and left central line remain in place,
unchanged. Right jugular catheter unchanged. Severe diffuse
bilateral airspace disease is unchanged. No effusions or
pneumothorax. Cardiomegaly.
IMPRESSION: Stable support devices. No pneumothorax.

Severe diffuse bilateral airspace disease and cardiomegaly,
unchanged.

## 2021-11-14 IMAGING — DX DG CHEST 1V PORT
1 series · 1 of 1 positions shown · non-contrast
Comparison: Portable exam 0665 hours compared to 09/03/2019

CLINICAL DATA: Encounter for intubation

EXAM:
PORTABLE CHEST 1 VIEW

[chest]
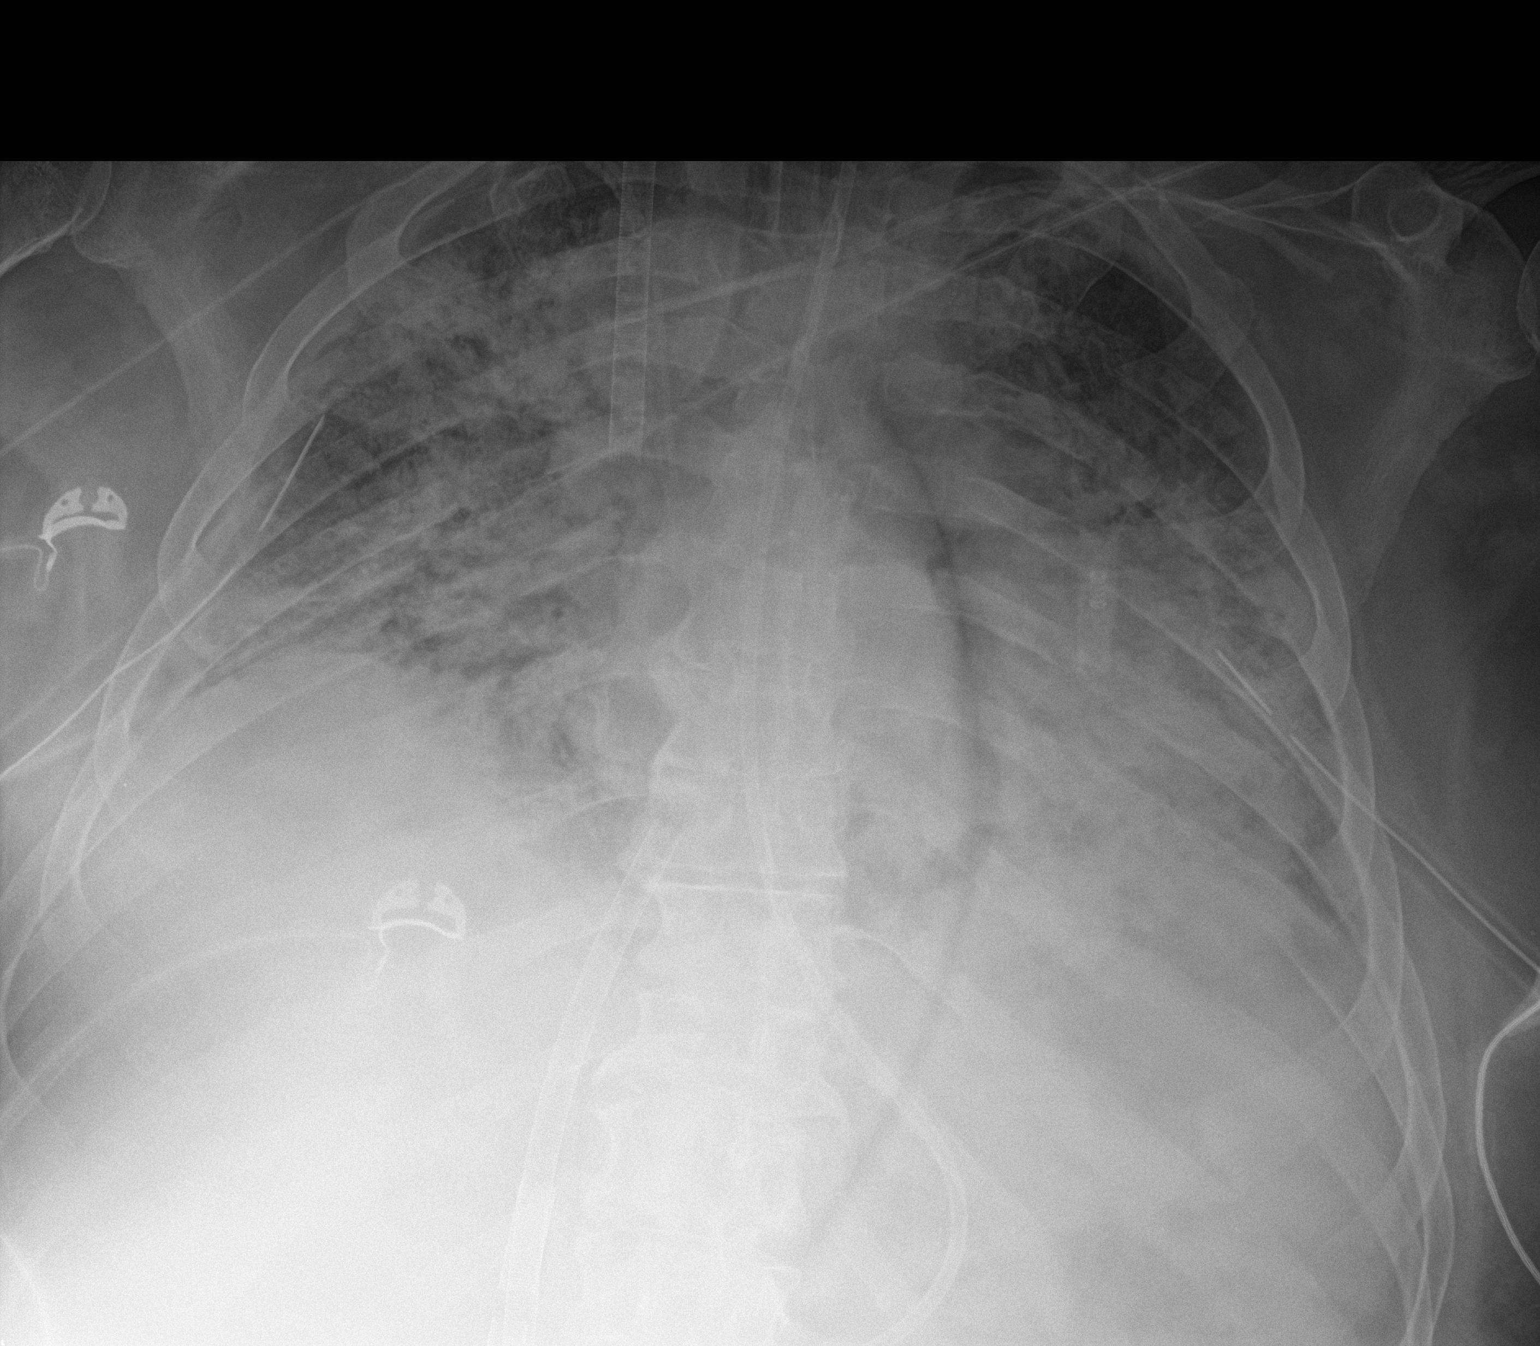

[1 of 1 positions shown; findings below may reference images not displayed]

FINDINGS: Tip of endotracheal tube projects 3.8 cm above carina.

RIGHT jugular Cordis catheter tip projects over SVC.

Feeding tube extends into stomach.

Cardiac line tip projects over RIGHT atrium from infra diaphragmatic
approach.

BILATERAL thoracostomy tubes.

RIGHT arm PICC line tip projects over SVC.

Diffuse BILATERAL airspace infiltrates which could represent
multifocal pneumonia or ARDS.

No pleural effusion or pneumothorax.
IMPRESSION: Lines and tubes as above.

Persistent BILATERAL pulmonary infiltrates question multifocal
pneumonia versus ARDS.

## 2021-11-15 IMAGING — DX DG CHEST 1V PORT
1 series · 1 of 1 positions shown · non-contrast
Comparison: Radiograph 09/04/2019

CLINICAL DATA: ECMO

EXAM:
PORTABLE CHEST 1 VIEW

[chest ap]
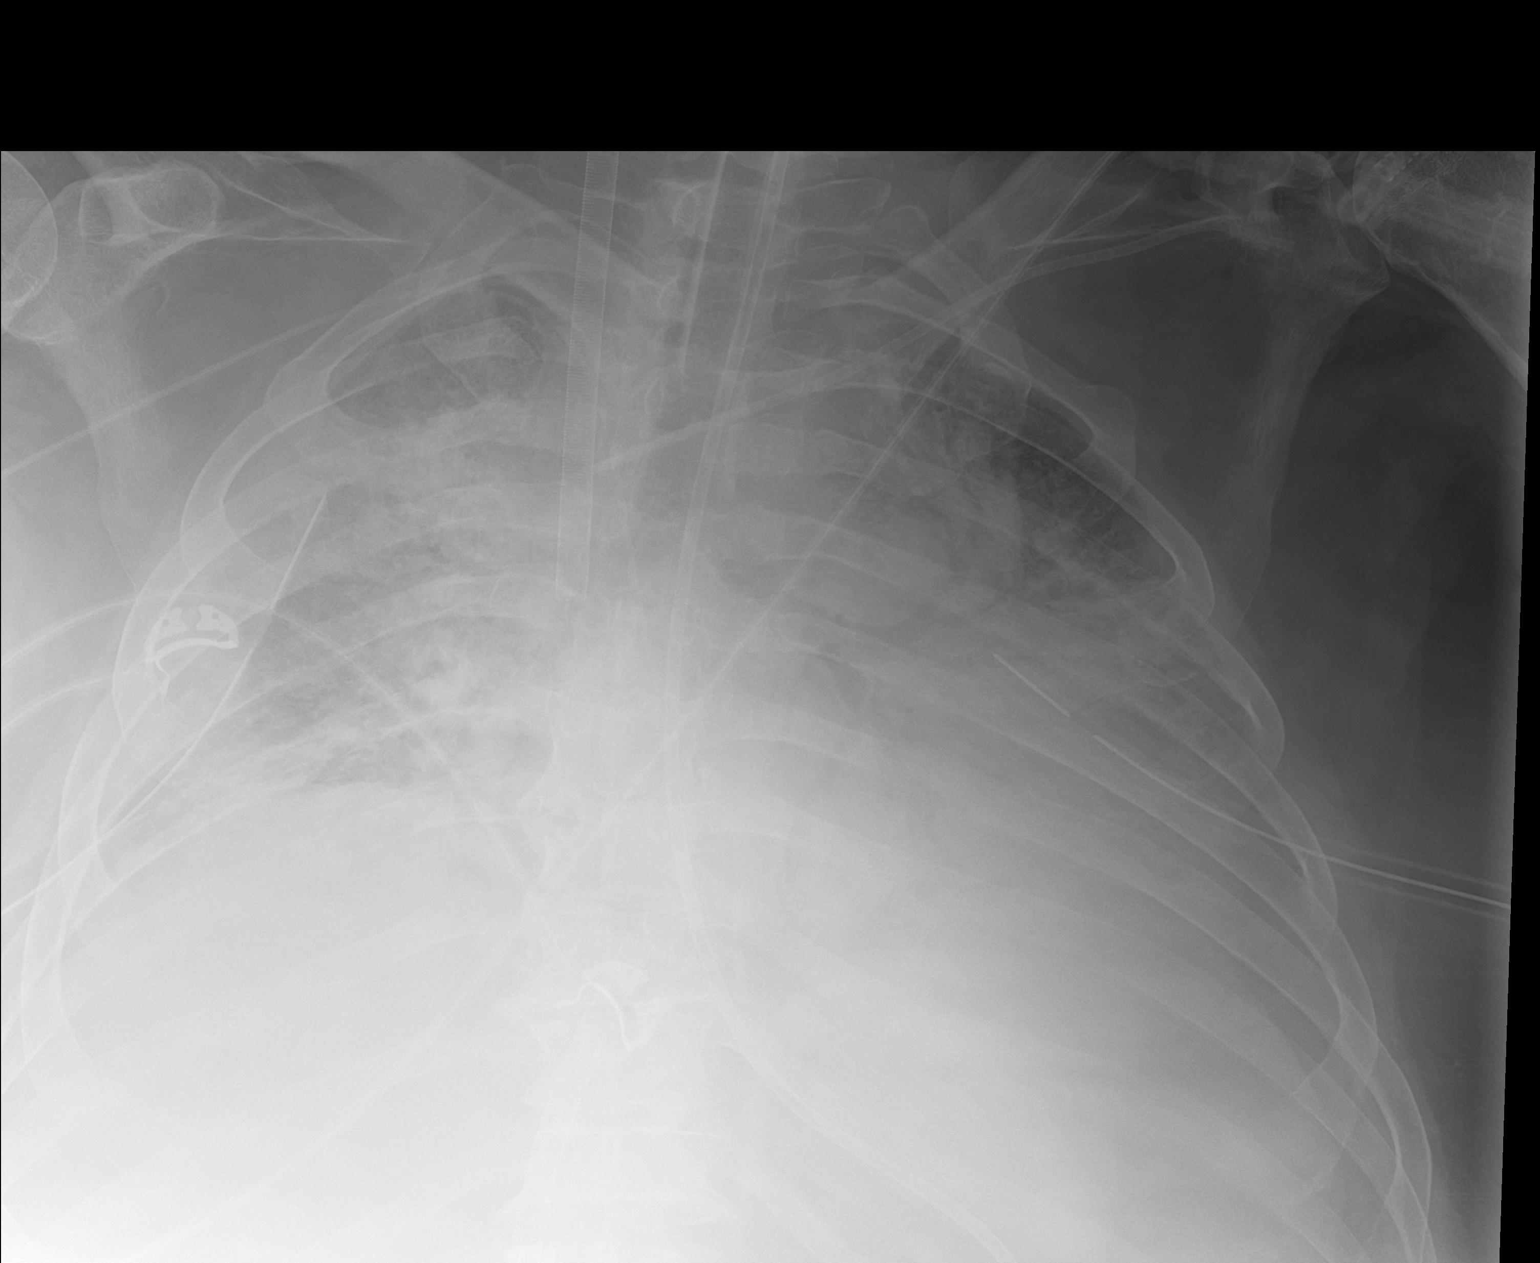

[1 of 1 positions shown; findings below may reference images not displayed]

FINDINGS: Bilateral chest tubes are in place. Large bore right IJ approach
ECMO cannula and IVC cannula remain in similar position to
comparison study. A transesophageal feeding tube tip terminates
below the level of the GE junction.

Widespread opacities throughout both lungs are similar to comparison
accounting for differences in technique. The cardiomediastinal
contours are obscured by overlying opacity. No visible pneumothorax.
Suspect at least some pleural fluid on the left. No acute osseous or
soft tissue abnormality.
IMPRESSION: 1. No significant change in widespread opacities throughout both
lungs. Suspect some left effusion.
2. Stable lines and tubes.
3. No visible pneumothorax.

## 2021-11-15 IMAGING — CT CT ABD-PELV W/O CM
4 of 6 series · 10 of 46 positions shown, 15 images · non-contrast
Comparison: CT scan of the abdomen dated 04/13/2019

CLINICAL DATA: Abdominal pain and distention. LDO5E-7V pneumonia.

EXAM:
CT CHEST, ABDOMEN AND PELVIS WITHOUT CONTRAST
TECHNIQUE: Multidetector CT imaging of the chest, abdomen and pelvis was
performed following the standard protocol without IV contrast.

[Series 4: cap w/o 5.0 mm st · axial · non-contrast · 0.96mm/px · z∈[-693,-458]mm · 2 of 143 slices shown, 5 images]
[im 48/143  soft-tissue]
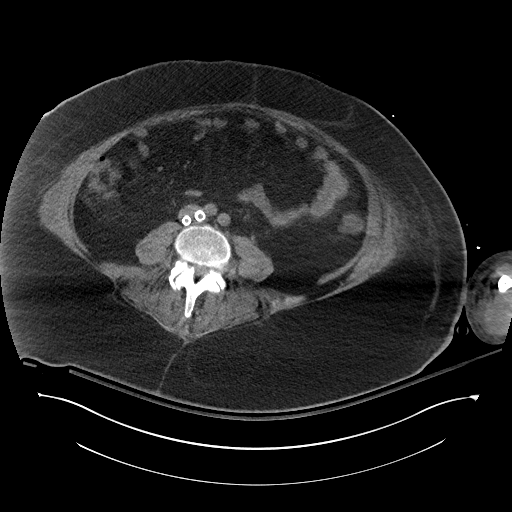
[im 48/143  lung]
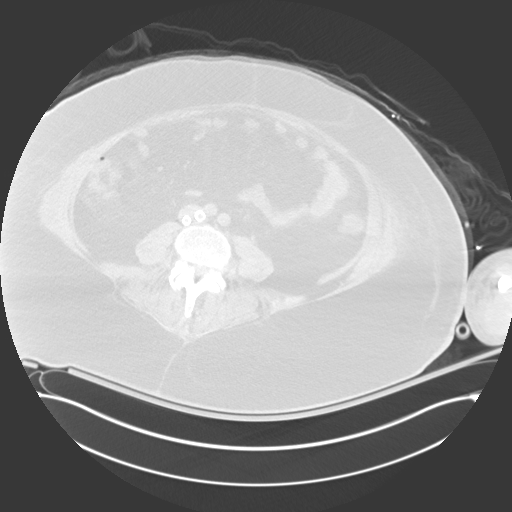
[im 48/143  bone]
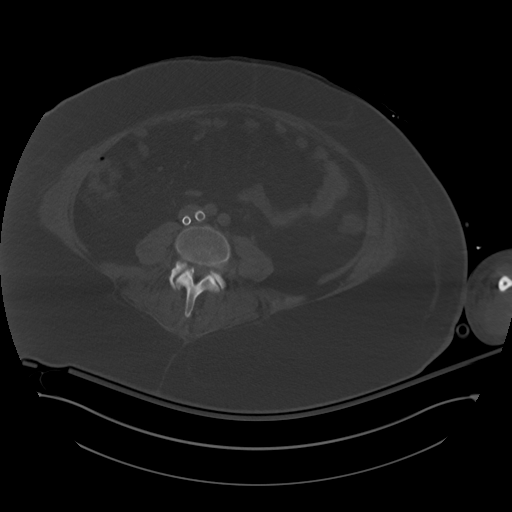
[im 95/143  soft-tissue]
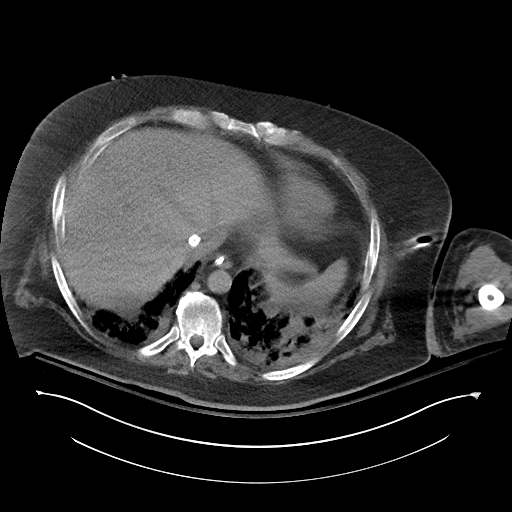
[im 95/143  lung]
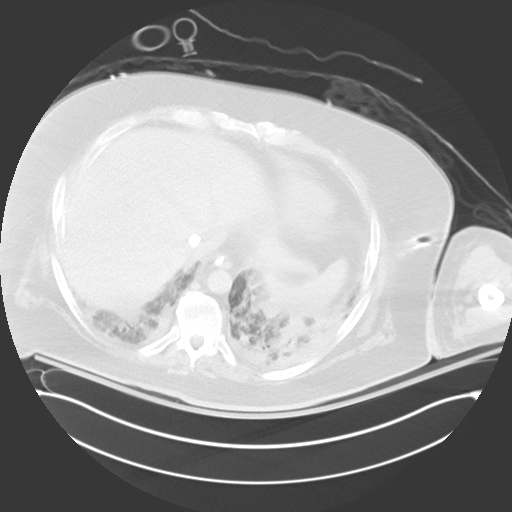

[Series 6: cap w/o 2.0 mm st · axial · non-contrast · 0.95mm/px · z∈[-816,-476]mm · 4 of 341 slices shown]
[im 57/341  soft-tissue]
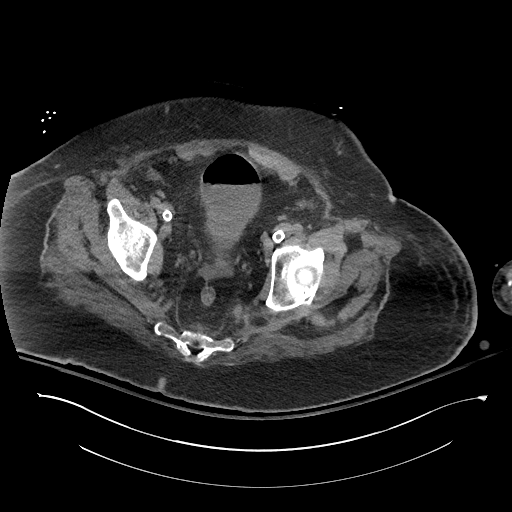
[im 114/341  soft-tissue]
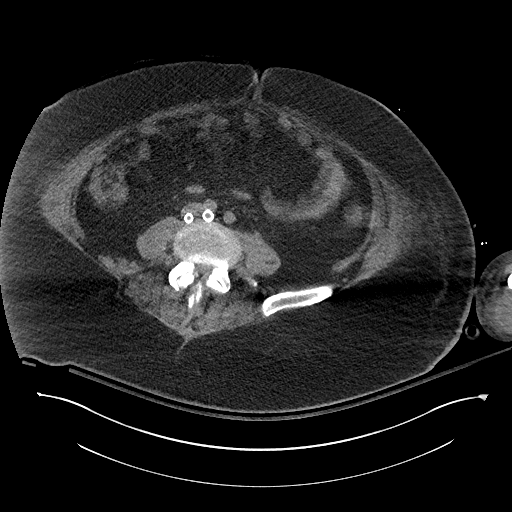
[im 171/341  soft-tissue]
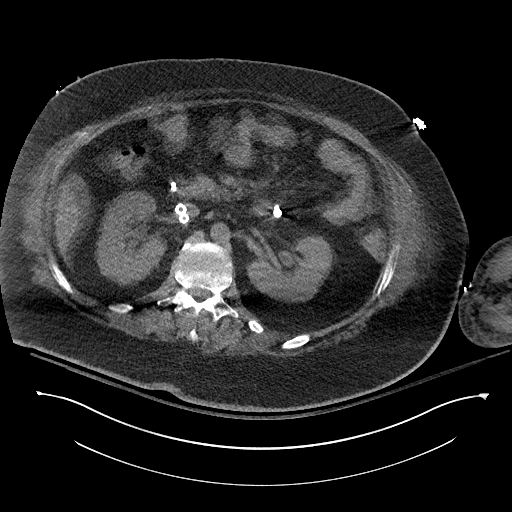
[im 227/341  soft-tissue]
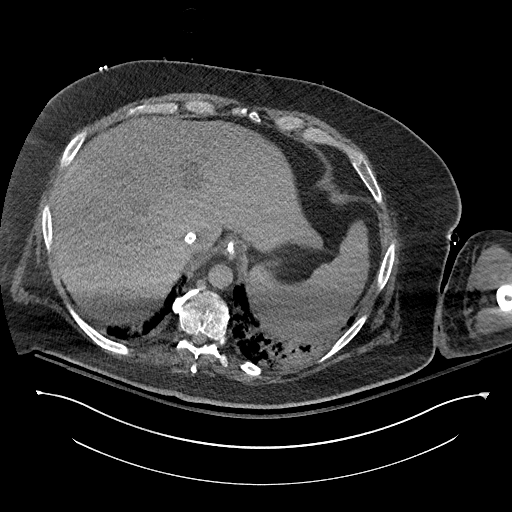

[Series 8: cap w/o 3.0 mm st cor · coronal · non-contrast · 1.00mm/px · 3 of 127 slices shown, 4 images]
[im 43/127  soft-tissue]
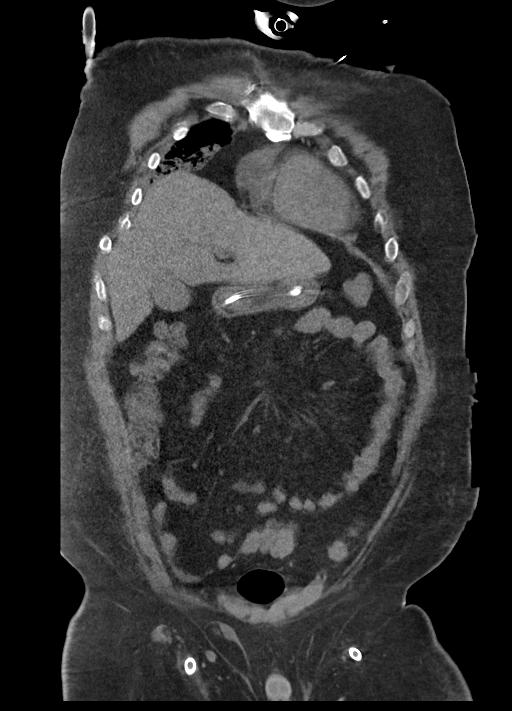
[im 57/127  soft-tissue]
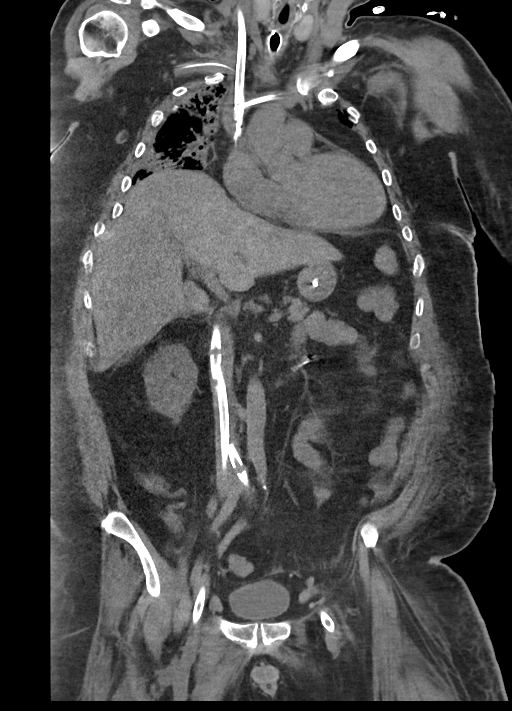
[im 57/127  bone]
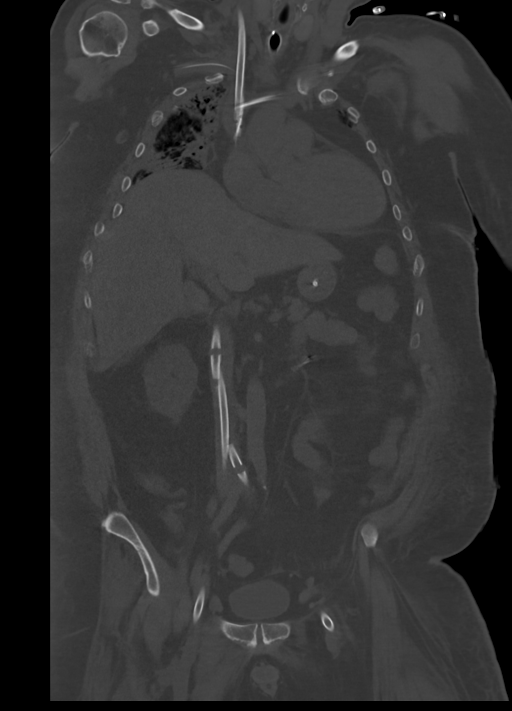
[im 71/127  soft-tissue]
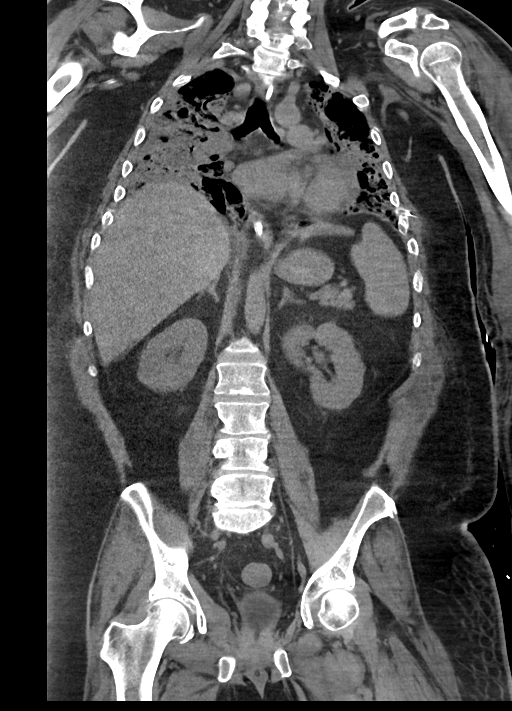

[Series 9: cap w/o 3.0 mm st sag · sagittal · non-contrast · 0.76mm/px · 1 of 146 slices shown, 2 images]
[im 49/146  soft-tissue]
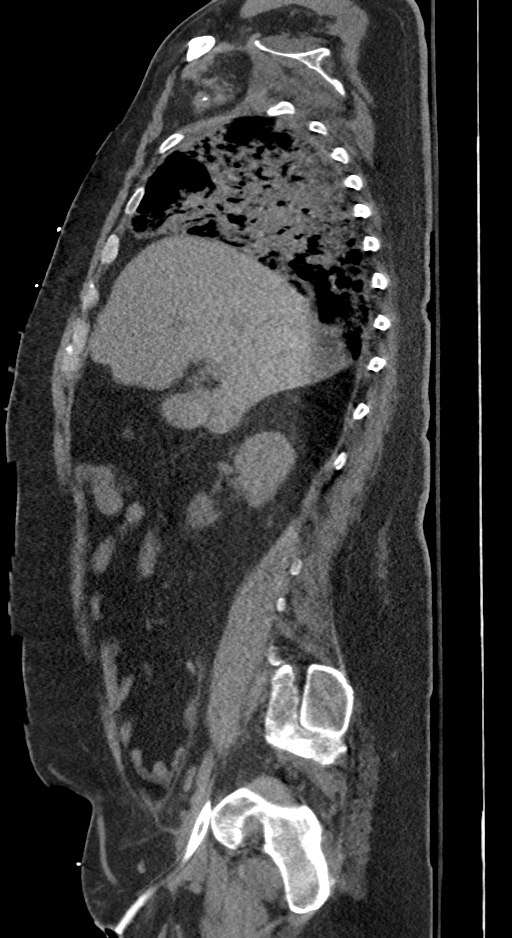
[im 49/146  bone]
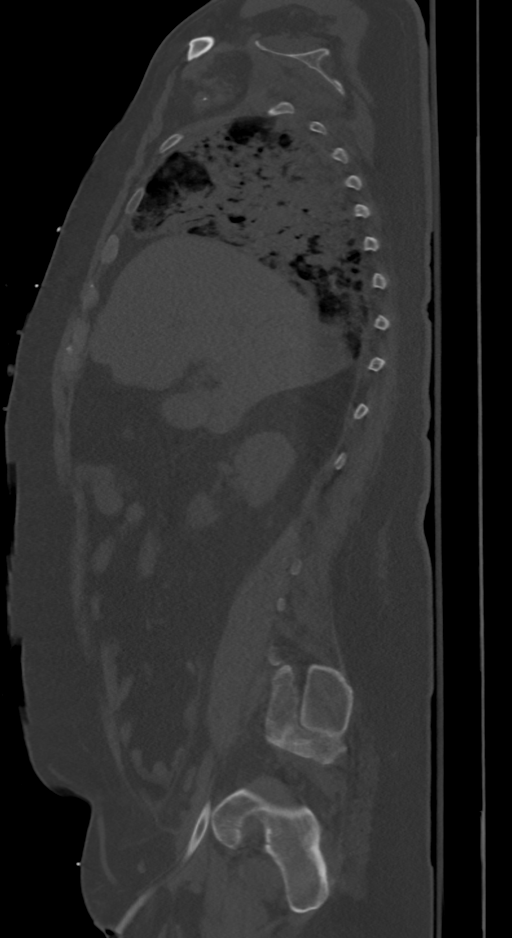

[10 of 46 positions shown; findings below may reference images not displayed]

FINDINGS: CT CHEST FINDINGS

Cardiovascular: Tracheostomy tube, feeding tube and 3 central venous
catheters in place in the chest. Bilateral chest tubes. Heart size
is normal. No pericardial effusion.

Mediastinum/Nodes: No significant axillary, hilar or mediastinal
adenopathy. Thyroid gland, trachea, and esophagus are normal.

Lungs/Pleura: There is dense consolidative pneumonia throughout both
lungs. There is very little remaining aerated lung. No effusions. No
pneumothorax.

Musculoskeletal: No chest wall mass or suspicious bone lesions
identified.

CT ABDOMEN PELVIS FINDINGS

Hepatobiliary: No focal liver abnormality is seen. No gallstones,
gallbladder wall thickening, or biliary dilatation.

Pancreas: Unremarkable. No pancreatic ductal dilatation or
surrounding inflammatory changes.

Spleen: Normal in size without focal abnormality.

Adrenals/Urinary Tract: Adrenal glands and kidneys are normal. No
hydronephrosis. Foley catheter in the bladder. Small amount of air
in the bladder.

Stomach/Bowel: Stomach is within normal limits. Appendix appears
normal. No evidence of bowel wall thickening, distention, or
inflammatory changes. Drain in the rectum. Feeding tube in the
fourth portion of the duodenum.

Vascular/Lymphatic: Bilateral femoral vein catheters are in place
with the tips in the upper inferior vena cava. No adenopathy.

Reproductive: Prostate is unremarkable.

Other: No ascites or free air.

Musculoskeletal: Slight subcutaneous edema in the proximal left
thigh and in the subcutaneous fat at the left lower quadrant of the
abdomen of unknown etiology. No significant bone abnormality.
IMPRESSION: 1. Dense consolidative pneumonia throughout both lungs.
2. No acute abnormality of the abdomen or pelvis.
3. Slight subcutaneous edema in the proximal left thigh and in the
subcutaneous fat at the left lower quadrant of the abdomen of
unknown etiology.

## 2021-11-15 IMAGING — CT CT CHEST W/O CM
4 of 5 series · 11 of 46 positions shown, 16 images · non-contrast
Comparison: CT scan of the abdomen dated 04/13/2019

CLINICAL DATA: Abdominal pain and distention. LDO5E-7V pneumonia.

EXAM:
CT CHEST, ABDOMEN AND PELVIS WITHOUT CONTRAST
TECHNIQUE: Multidetector CT imaging of the chest, abdomen and pelvis was
performed following the standard protocol without IV contrast.

[Series 4: cap w/o 5.0 mm st · axial · non-contrast · 0.96mm/px · z∈[-828,-323]mm · 6 of 143 slices shown, 11 images]
[im 21/143  soft-tissue]
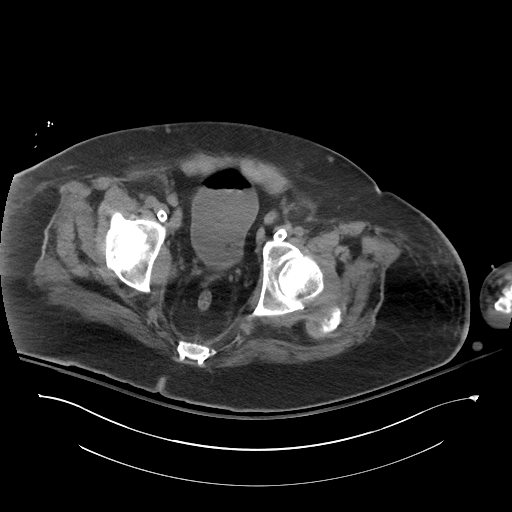
[im 21/143  bone]
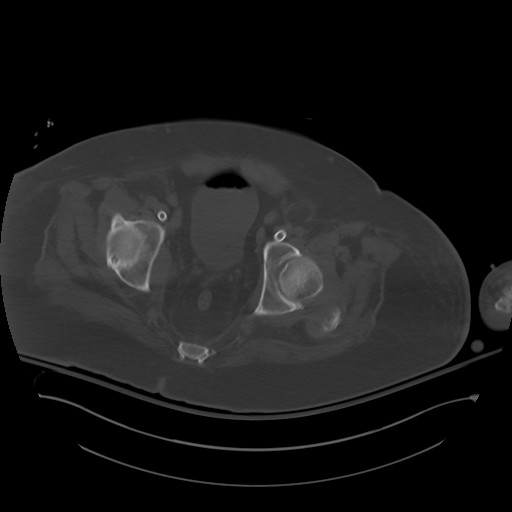
[im 41/143  soft-tissue]
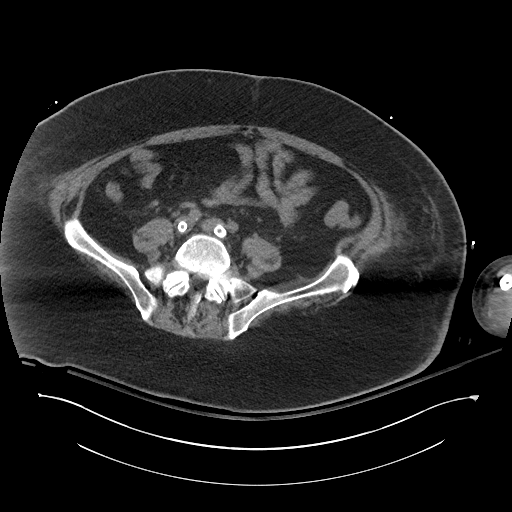
[im 61/143  soft-tissue]
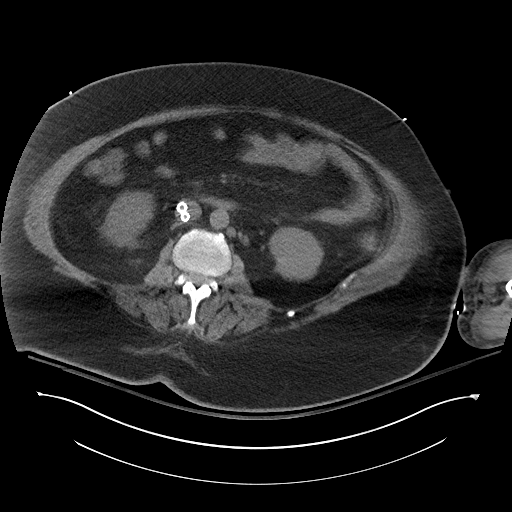
[im 61/143  lung]
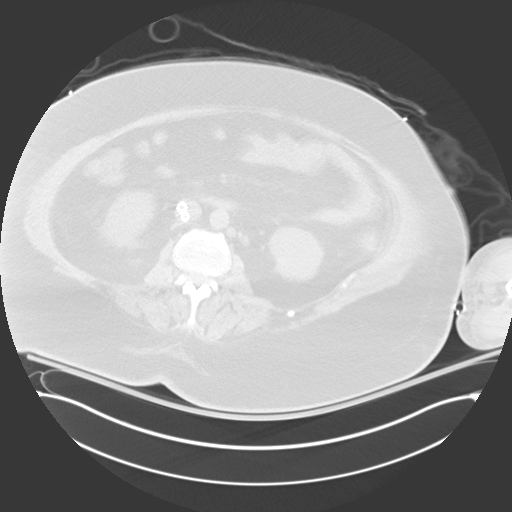
[im 82/143  soft-tissue]
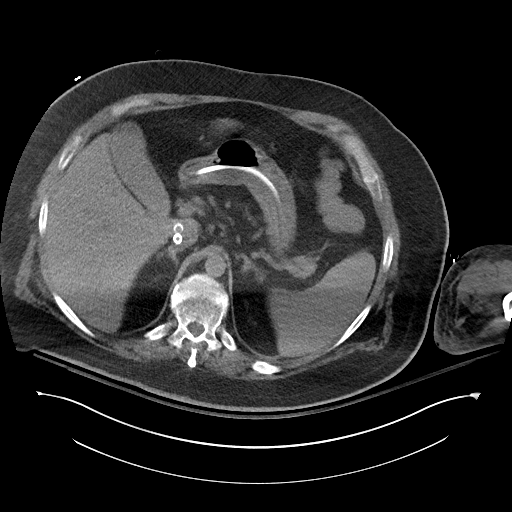
[im 82/143  lung]
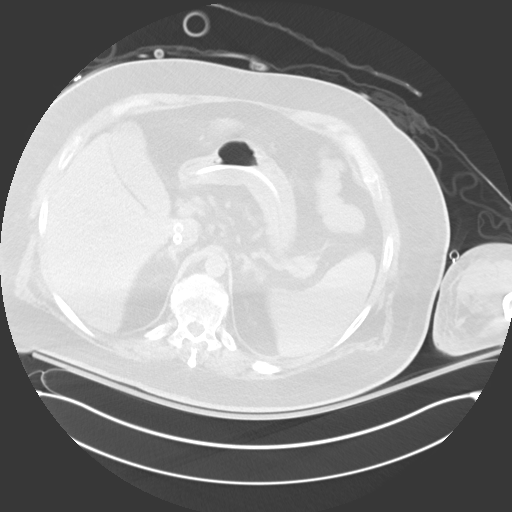
[im 102/143  soft-tissue]
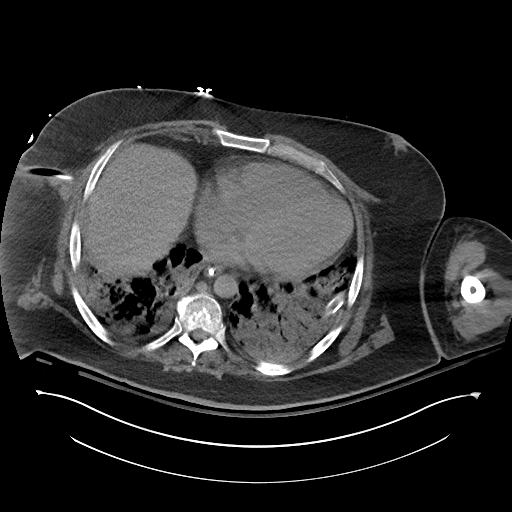
[im 102/143  lung]
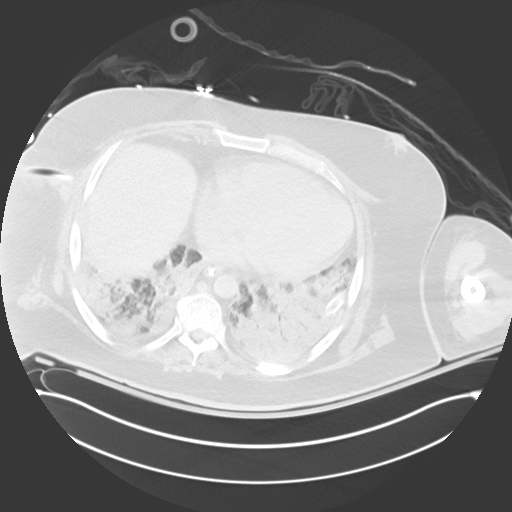
[im 122/143  soft-tissue]
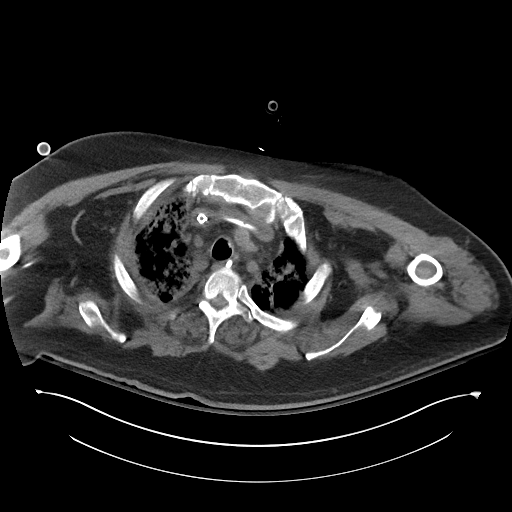
[im 122/143  lung]
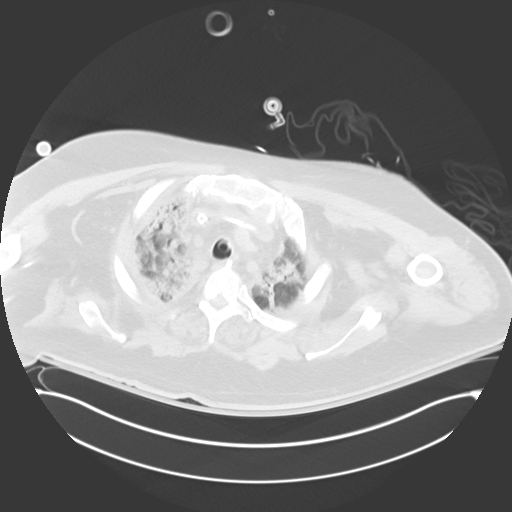

[Series 6: cap w/o 2.0 mm st · axial · non-contrast · 0.95mm/px · 1 of 341 slices shown]
[im 22/341  soft-tissue]
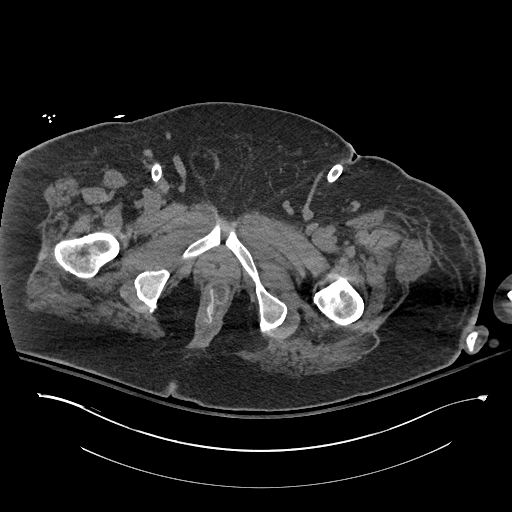

[Series 8: cap w/o 3.0 mm st cor · coronal · non-contrast · 1.00mm/px · 3 of 127 slices shown]
[im 43/127  soft-tissue]
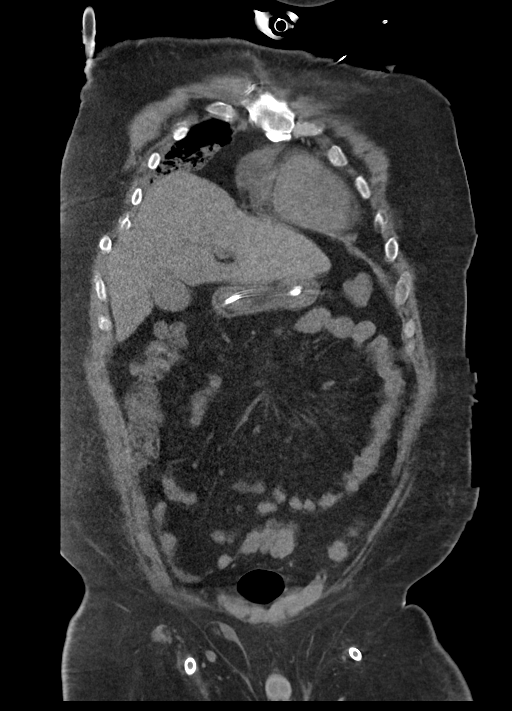
[im 57/127  soft-tissue]
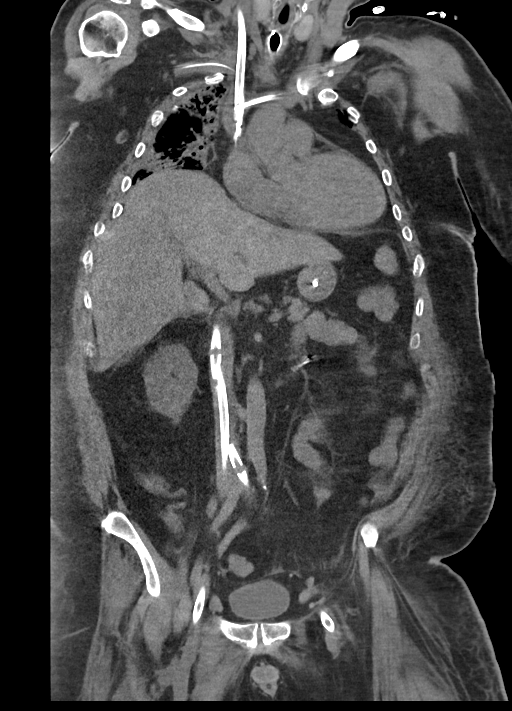
[im 71/127  soft-tissue]
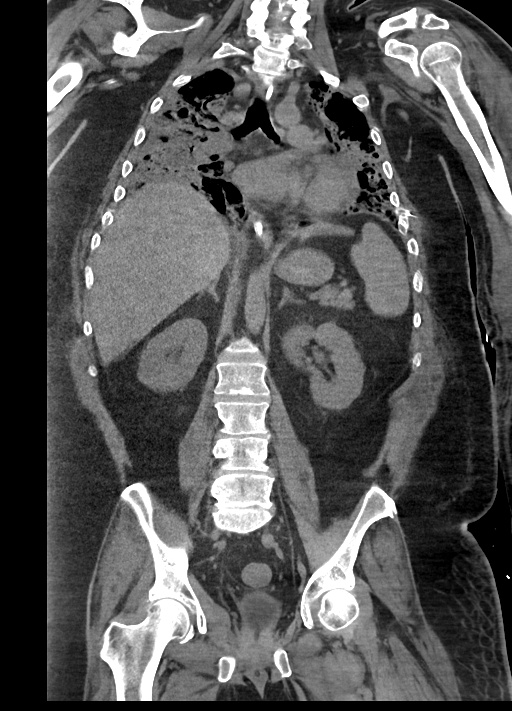

[Series 9: cap w/o 3.0 mm st sag · sagittal · non-contrast · 0.76mm/px · 1 of 146 slices shown]
[im 49/146  soft-tissue]
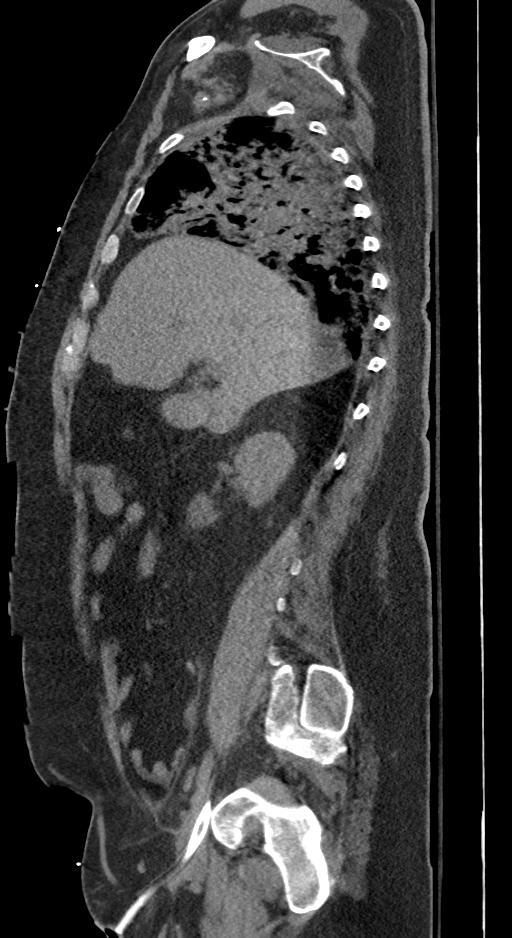

[11 of 46 positions shown; findings below may reference images not displayed]

FINDINGS: CT CHEST FINDINGS

Cardiovascular: Tracheostomy tube, feeding tube and 3 central venous
catheters in place in the chest. Bilateral chest tubes. Heart size
is normal. No pericardial effusion.

Mediastinum/Nodes: No significant axillary, hilar or mediastinal
adenopathy. Thyroid gland, trachea, and esophagus are normal.

Lungs/Pleura: There is dense consolidative pneumonia throughout both
lungs. There is very little remaining aerated lung. No effusions. No
pneumothorax.

Musculoskeletal: No chest wall mass or suspicious bone lesions
identified.

CT ABDOMEN PELVIS FINDINGS

Hepatobiliary: No focal liver abnormality is seen. No gallstones,
gallbladder wall thickening, or biliary dilatation.

Pancreas: Unremarkable. No pancreatic ductal dilatation or
surrounding inflammatory changes.

Spleen: Normal in size without focal abnormality.

Adrenals/Urinary Tract: Adrenal glands and kidneys are normal. No
hydronephrosis. Foley catheter in the bladder. Small amount of air
in the bladder.

Stomach/Bowel: Stomach is within normal limits. Appendix appears
normal. No evidence of bowel wall thickening, distention, or
inflammatory changes. Drain in the rectum. Feeding tube in the
fourth portion of the duodenum.

Vascular/Lymphatic: Bilateral femoral vein catheters are in place
with the tips in the upper inferior vena cava. No adenopathy.

Reproductive: Prostate is unremarkable.

Other: No ascites or free air.

Musculoskeletal: Slight subcutaneous edema in the proximal left
thigh and in the subcutaneous fat at the left lower quadrant of the
abdomen of unknown etiology. No significant bone abnormality.
IMPRESSION: 1. Dense consolidative pneumonia throughout both lungs.
2. No acute abnormality of the abdomen or pelvis.
3. Slight subcutaneous edema in the proximal left thigh and in the
subcutaneous fat at the left lower quadrant of the abdomen of
unknown etiology.

## 2021-11-15 IMAGING — CT CT HEAD W/O CM
4 series · 16 of 47 positions shown, 18 images · non-contrast
Comparison: None.

CLINICAL DATA: Cerebral hemorrhage suspected. History of [id number]
pneumonia with cardiac and respiratory failure on ECMO. Nose bleeds.

EXAM:
CT HEAD WITHOUT CONTRAST
TECHNIQUE: Contiguous axial images were obtained from the base of the skull
through the vertex without intravenous contrast.

[Series 3: head without · axial · non-contrast · 0.47mm/px · z∈[-121,+4]mm · 7 of 35 slices shown, 9 images]
[im 5/35  brain]
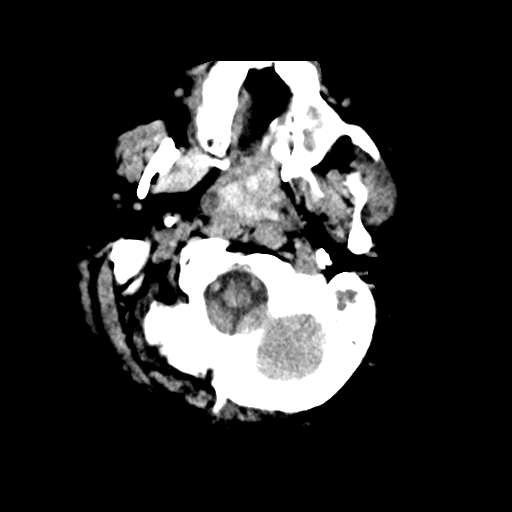
[im 5/35  bone]
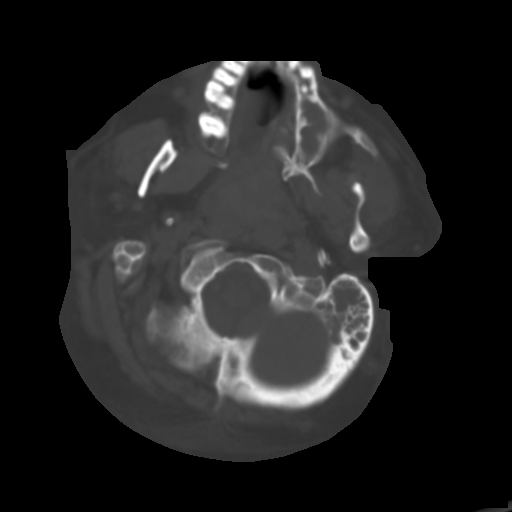
[im 9/35  brain]
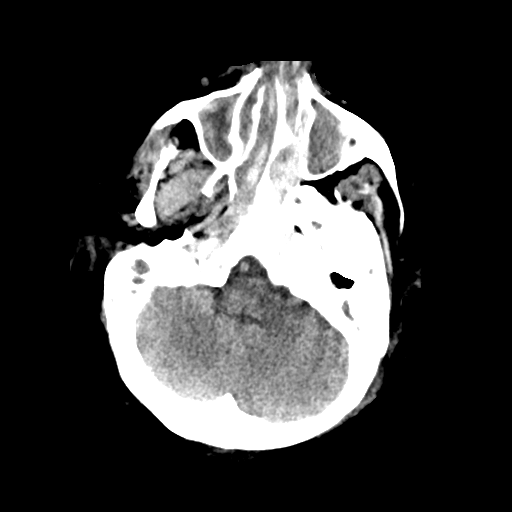
[im 13/35  brain]
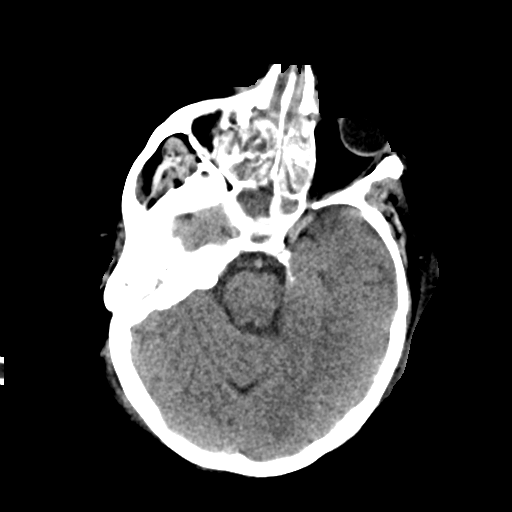
[im 18/35  brain]
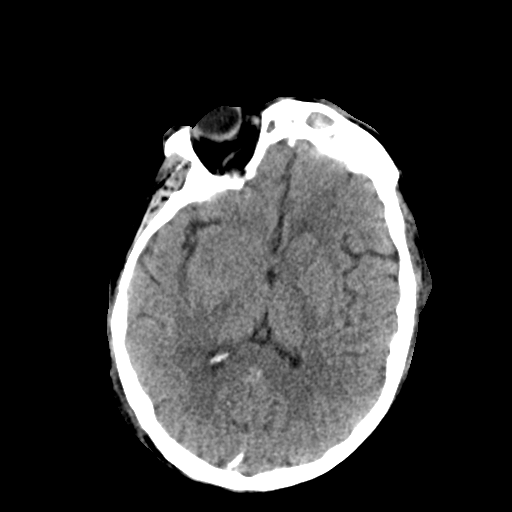
[im 22/35  brain]
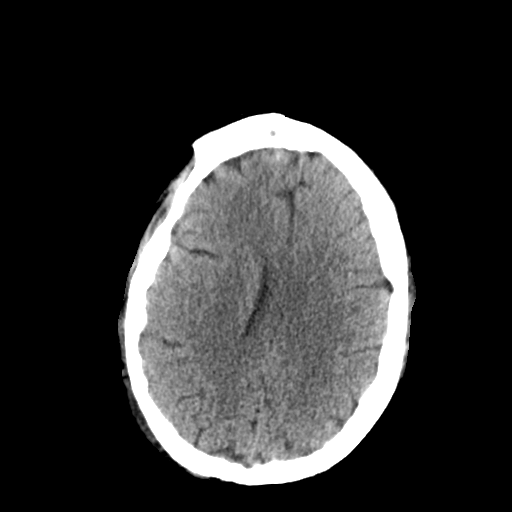
[im 22/35  bone]
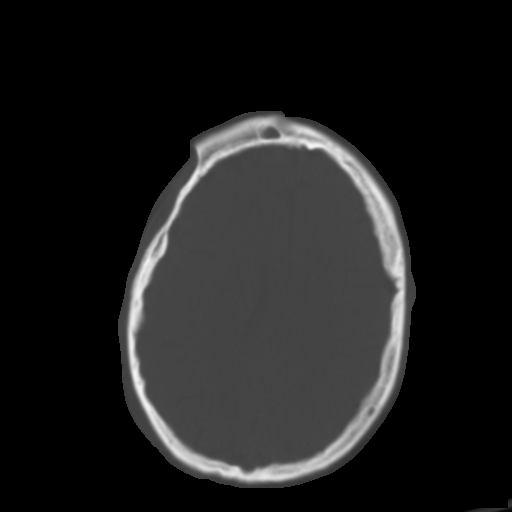
[im 26/35  brain]
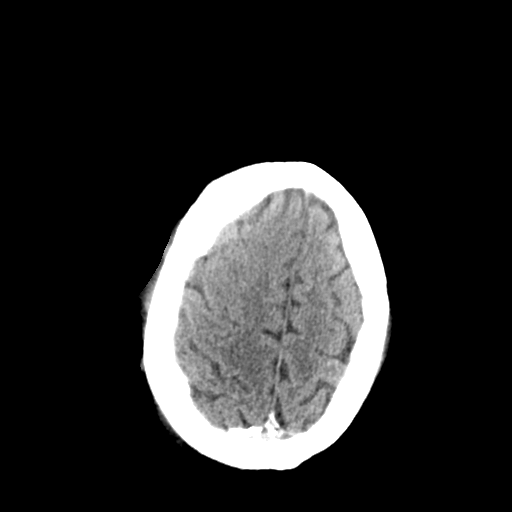
[im 30/35  brain]
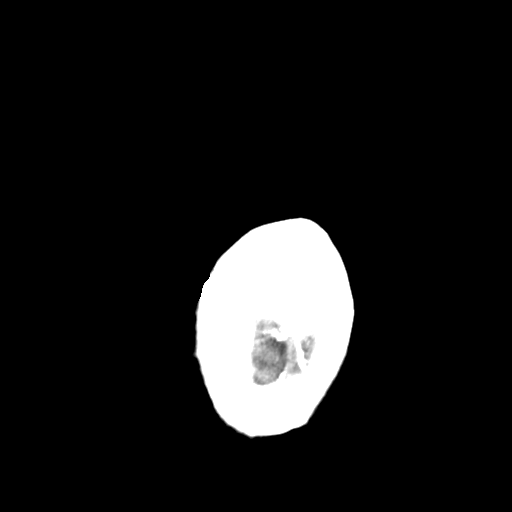

[Series 4: head bone · axial · 0.47mm/px · z∈[-125,-91]mm · 3 of 86 slices shown]
[im 9/86  bone]
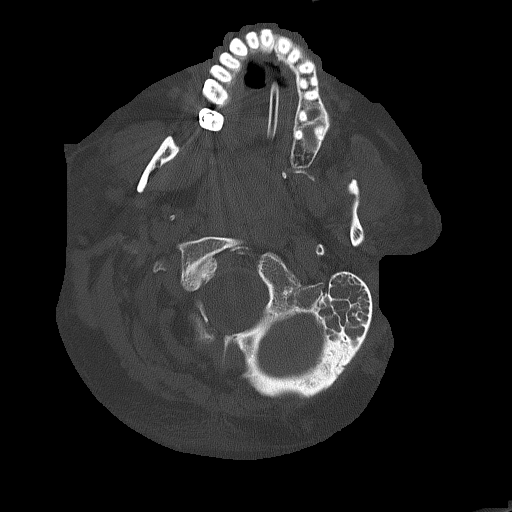
[im 18/86  bone]
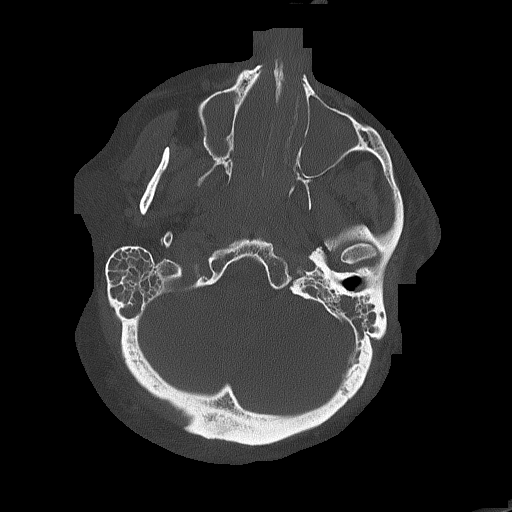
[im 26/86  bone]
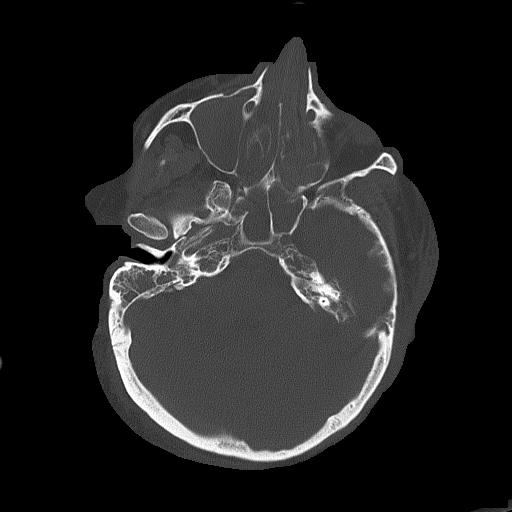

[Series 5: head without cor · coronal · non-contrast · 0.31mm/px · 3 of 81 slices shown]
[im 27/81  brain]
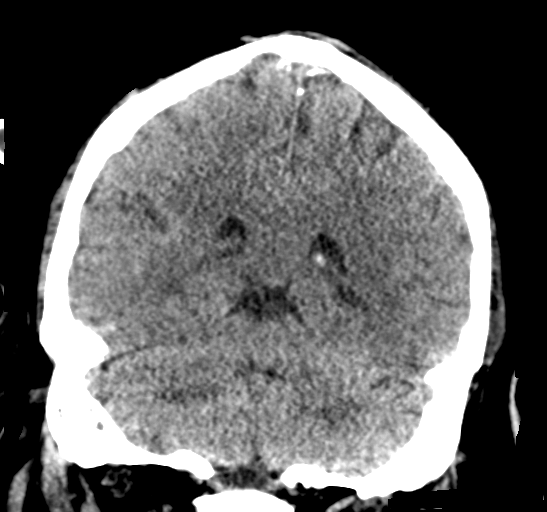
[im 36/81  brain]
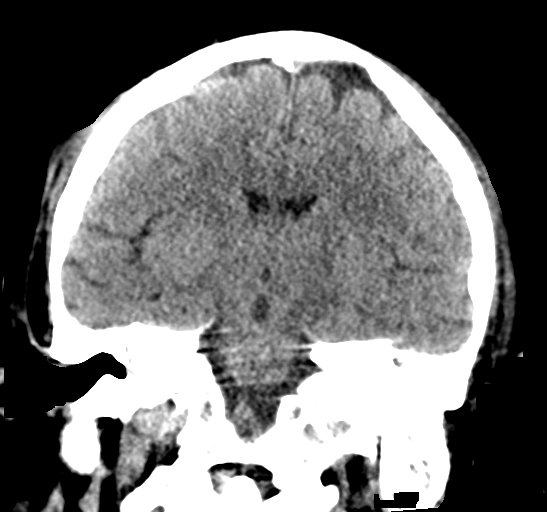
[im 45/81  brain]
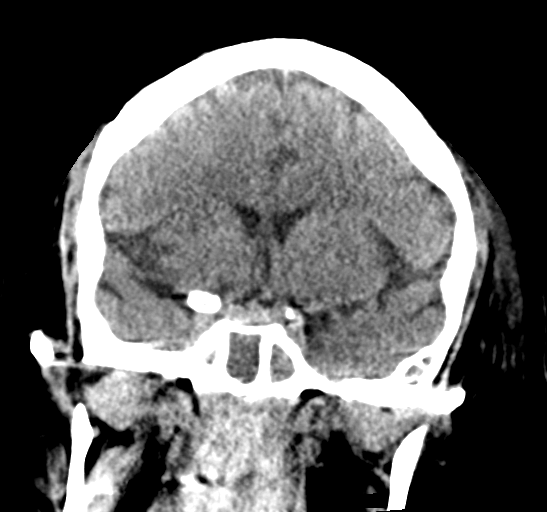

[Series 6: head without sag · sagittal · non-contrast · 0.33mm/px · 3 of 67 slices shown]
[im 29/67  brain]
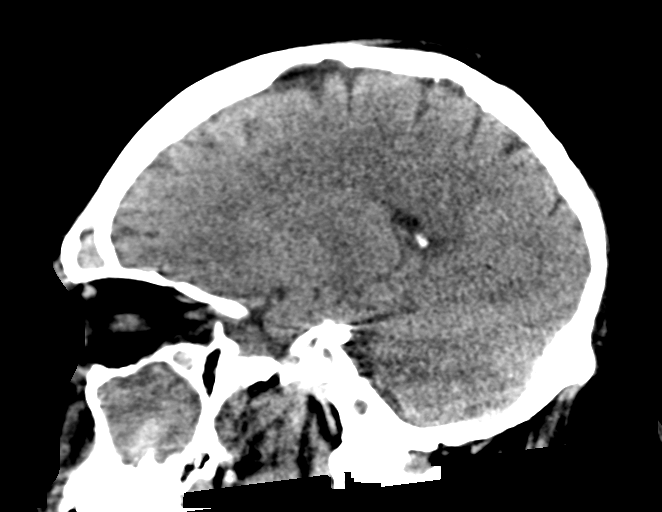
[im 34/67  brain]
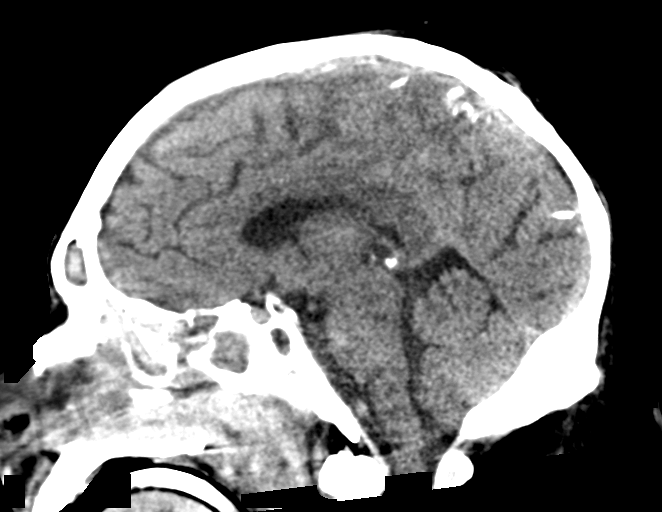
[im 38/67  brain]
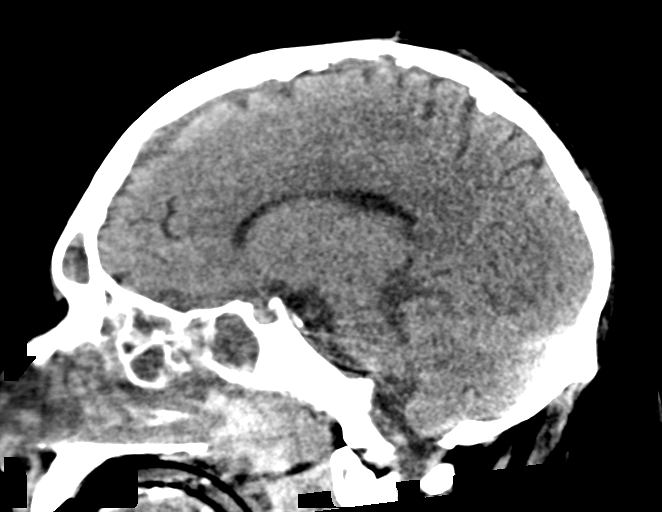

[16 of 47 positions shown; findings below may reference images not displayed]

FINDINGS: Brain: There is no evidence of acute infarct, intracranial
hemorrhage, mass, midline shift, or extra-axial fluid collection.
The ventricles and sulci are normal.

Vascular: No hyperdense vessel.

Skull: No fracture or suspicious osseous lesion.

Sinuses/Orbits: Complete opacification of the paranasal sinuses,
mastoid air cells, and tympanic cavities bilaterally. Unremarkable
orbits.

Other: Partially visualized feeding tube.
IMPRESSION: 1. Unremarkable CT appearance of the brain.
2. Pansinusitis.  Large bilateral mastoid and middle ear effusions.

## 2021-11-15 IMAGING — DX DG CHEST 1V PORT
1 series · 2 of 2 positions shown · non-contrast
Comparison: CT chest 09/05/2019.  Chest x-ray 09/05/2019.

CLINICAL DATA: ECMO cannula advancement.

EXAM:
PORTABLE CHEST 1 VIEW

[Series 1: chest · 0.14mm/px · 2 of 2 slices shown]
[im 1/2]
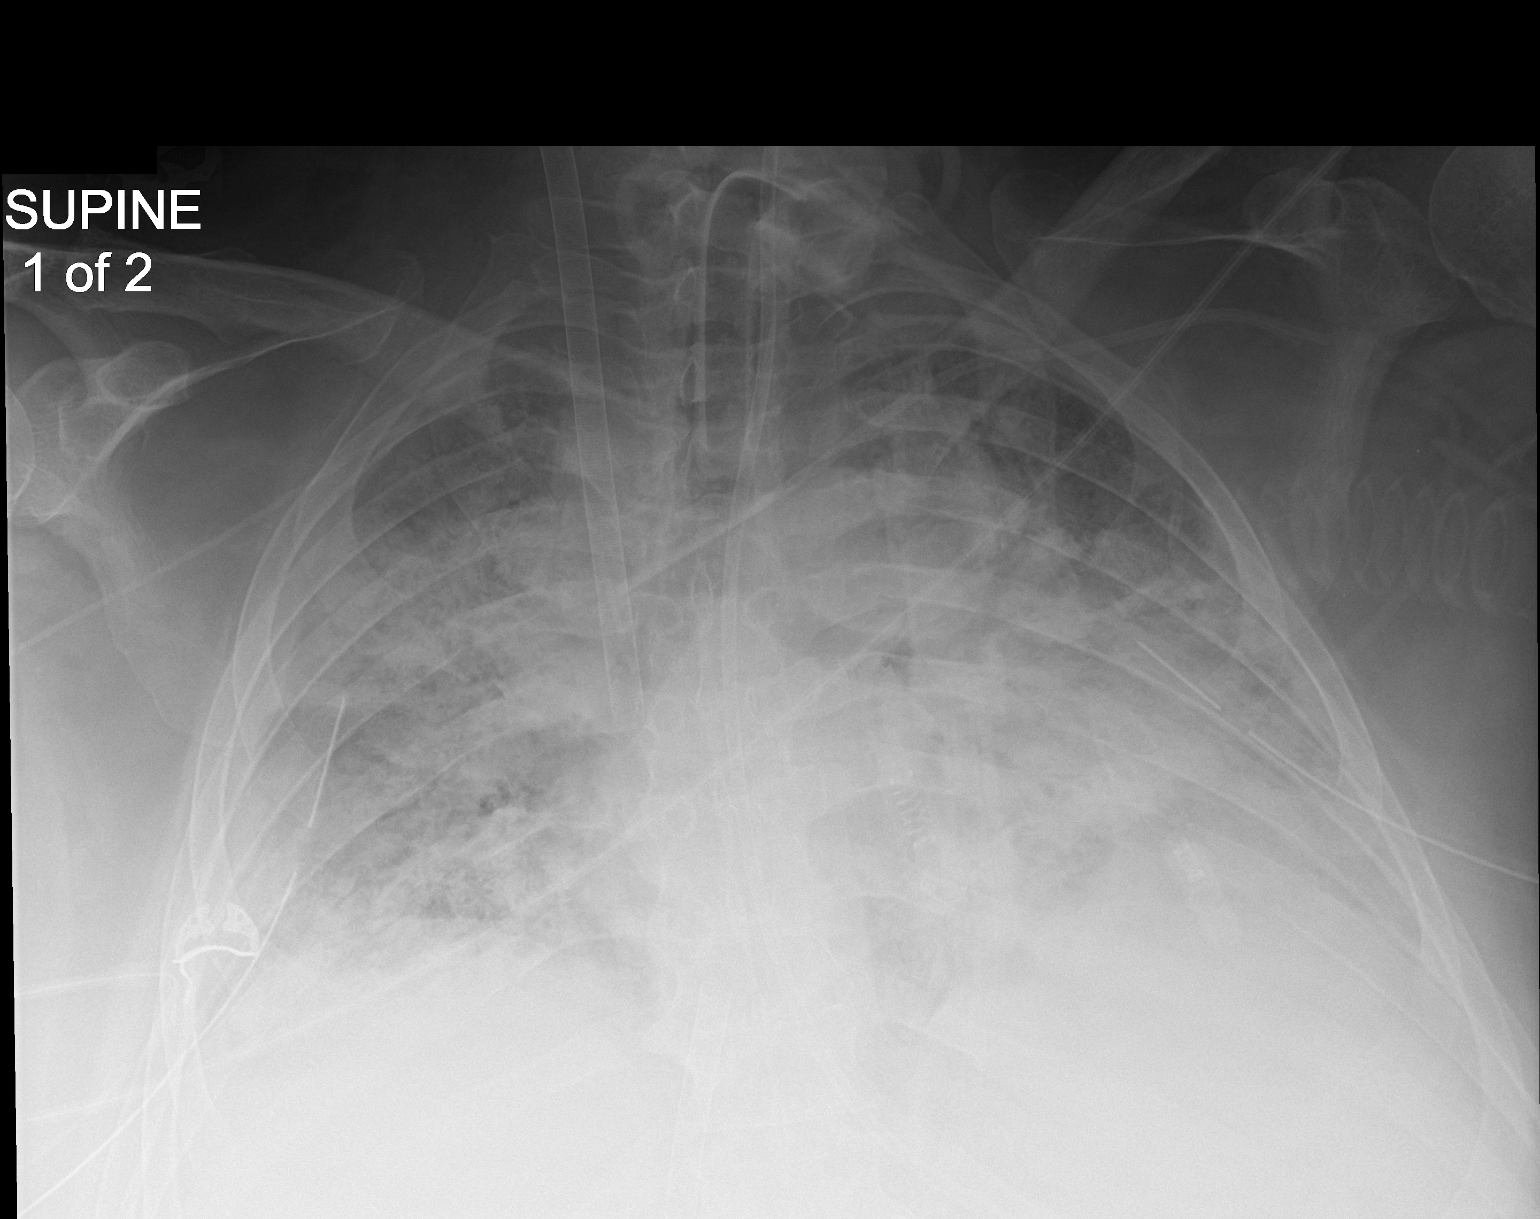
[im 2/2]
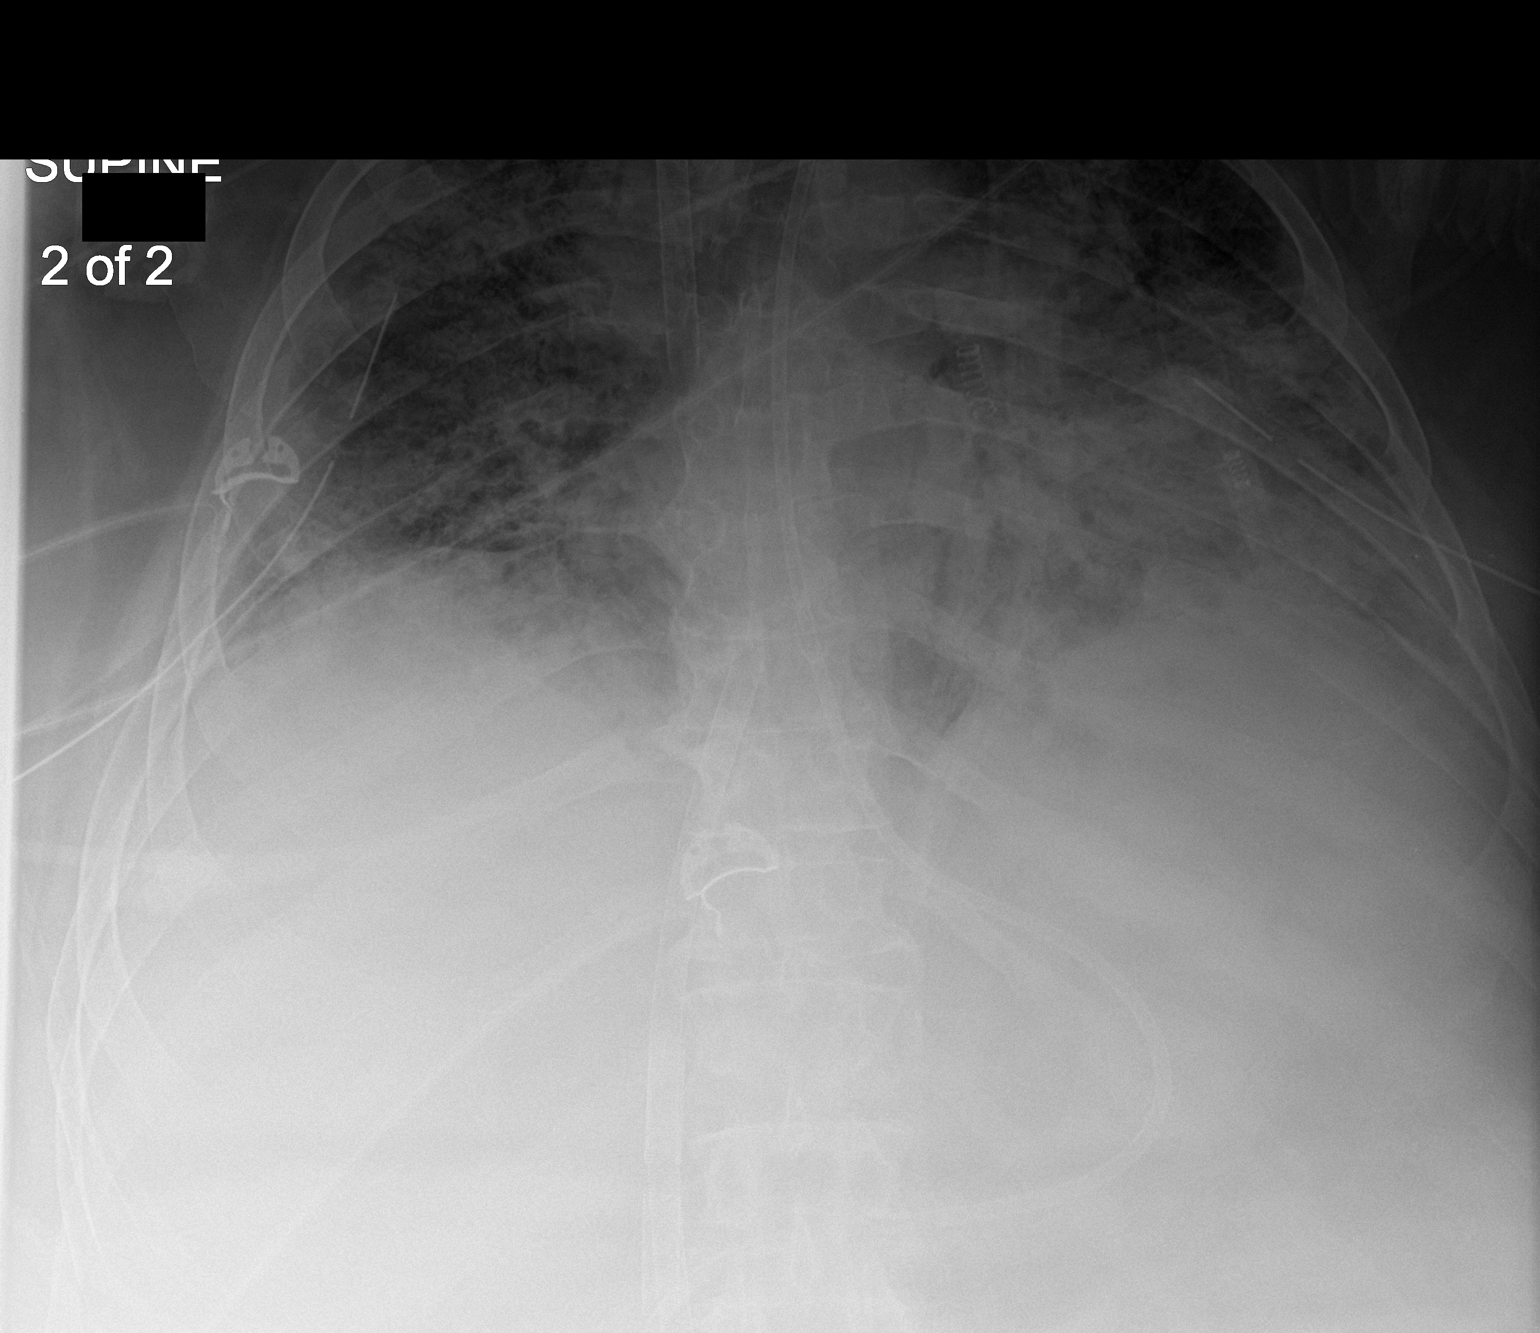

[2 of 2 positions shown; findings below may reference images not displayed]

FINDINGS: Right IJ ECMO cannula tip noted over the cavoatrial junction. IVC
cannula noted over the IVC right atrial junction. Tracheostomy tube,
feeding tube, right PICC line, bilateral chest tubes in stable
position. No pneumothorax. Diffuse dense bilateral pulmonary
infiltrates/edema again noted. Cardiomegaly again noted.
IMPRESSION: 1. Right IJ ECMO cannula tip noted over the cavoatrial junction. IVC
can noted over the IVC right atrial junction. Remaining lines and
tubes including bilateral chest tubes in stable position. No
pneumothorax.

2. Diffuse dense bilateral pulmonary infiltrates/edema again noted
without interim change.

## 2021-11-16 IMAGING — DX DG CHEST 1V PORT
1 series · 1 of 1 positions shown · non-contrast
Comparison: Radiograph 09/05/2019

CLINICAL DATA: ECMO

EXAM:
PORTABLE CHEST 1 VIEW

[chest ap]
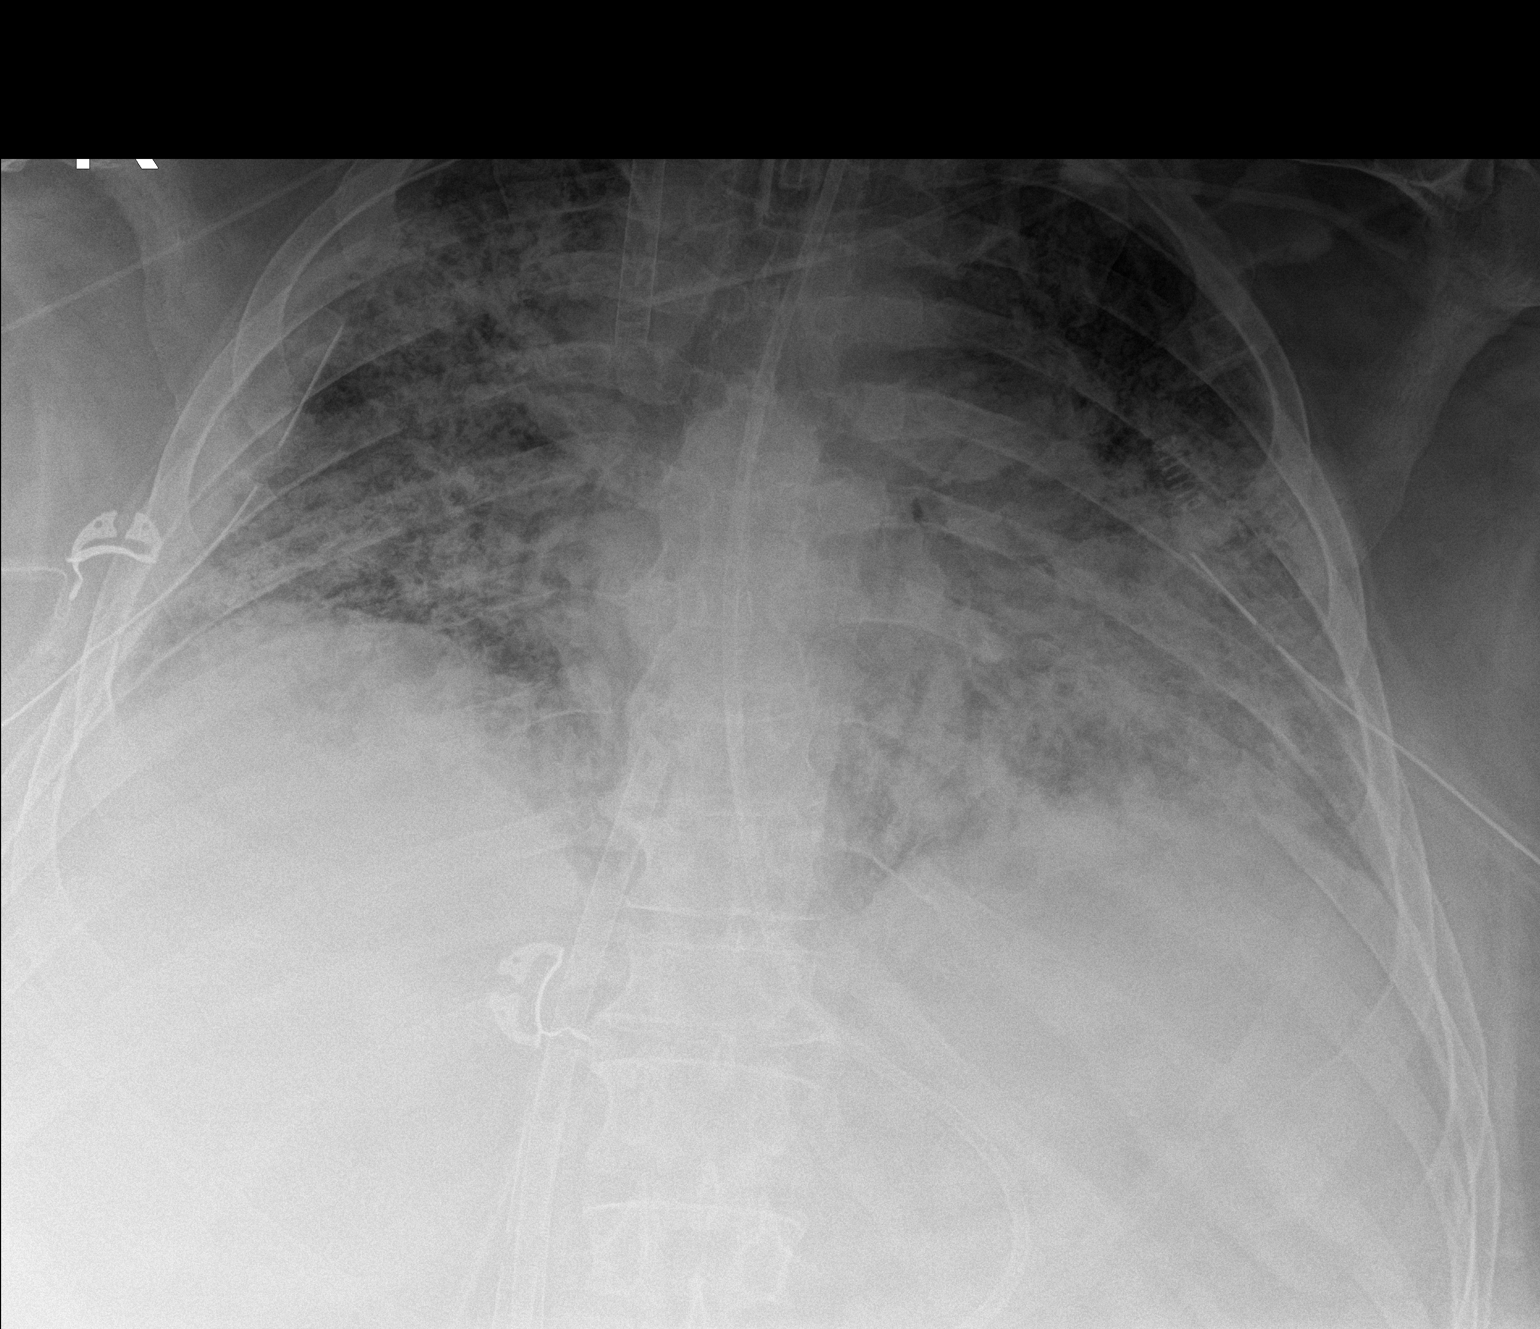

[1 of 1 positions shown; findings below may reference images not displayed]

FINDINGS: Right IJ ECMO cannula tip appears slightly retracted from 1 day
prior, now positioned in the mid SVC. IVC cannula tip terminates at
the level of the right atrium. Endotracheal tube remains in the mid
trachea, 3.7 cm from the carina. Transesophageal feeding tube tip is
below the level of imaging. Bilateral chest tubes are again seen.

Minimal interval clearing of the dense bilateral airspace opacities
in both lungs. No new areas of opacity are seen. No visible
pneumothorax or effusion. Stable cardiomegaly though much of the
heart is obscured by overlying opacity.
IMPRESSION: 1. Right IJ ECMO cannula tip appears slightly retracted from 1 day
prior, now positioned in the mid SVC.
2. Remaining lines and tubes appear unchanged in position.
3. Minimal interval clearing of the dense bilateral airspace
opacities.

## 2021-11-17 ENCOUNTER — Other Ambulatory Visit: Payer: Self-pay | Admitting: Behavioral Health

## 2021-11-17 DIAGNOSIS — F411 Generalized anxiety disorder: Secondary | ICD-10-CM

## 2021-11-17 DIAGNOSIS — F331 Major depressive disorder, recurrent, moderate: Secondary | ICD-10-CM

## 2021-11-17 DIAGNOSIS — F4321 Adjustment disorder with depressed mood: Secondary | ICD-10-CM

## 2021-11-17 IMAGING — DX DG CHEST 1V PORT
1 series · 1 of 1 positions shown · non-contrast
Comparison: Prior radiograph from 09/06/2019.

CLINICAL DATA: Initial evaluation for cardio respiratory problem.

EXAM:
PORTABLE CHEST 1 VIEW

[chest ap]
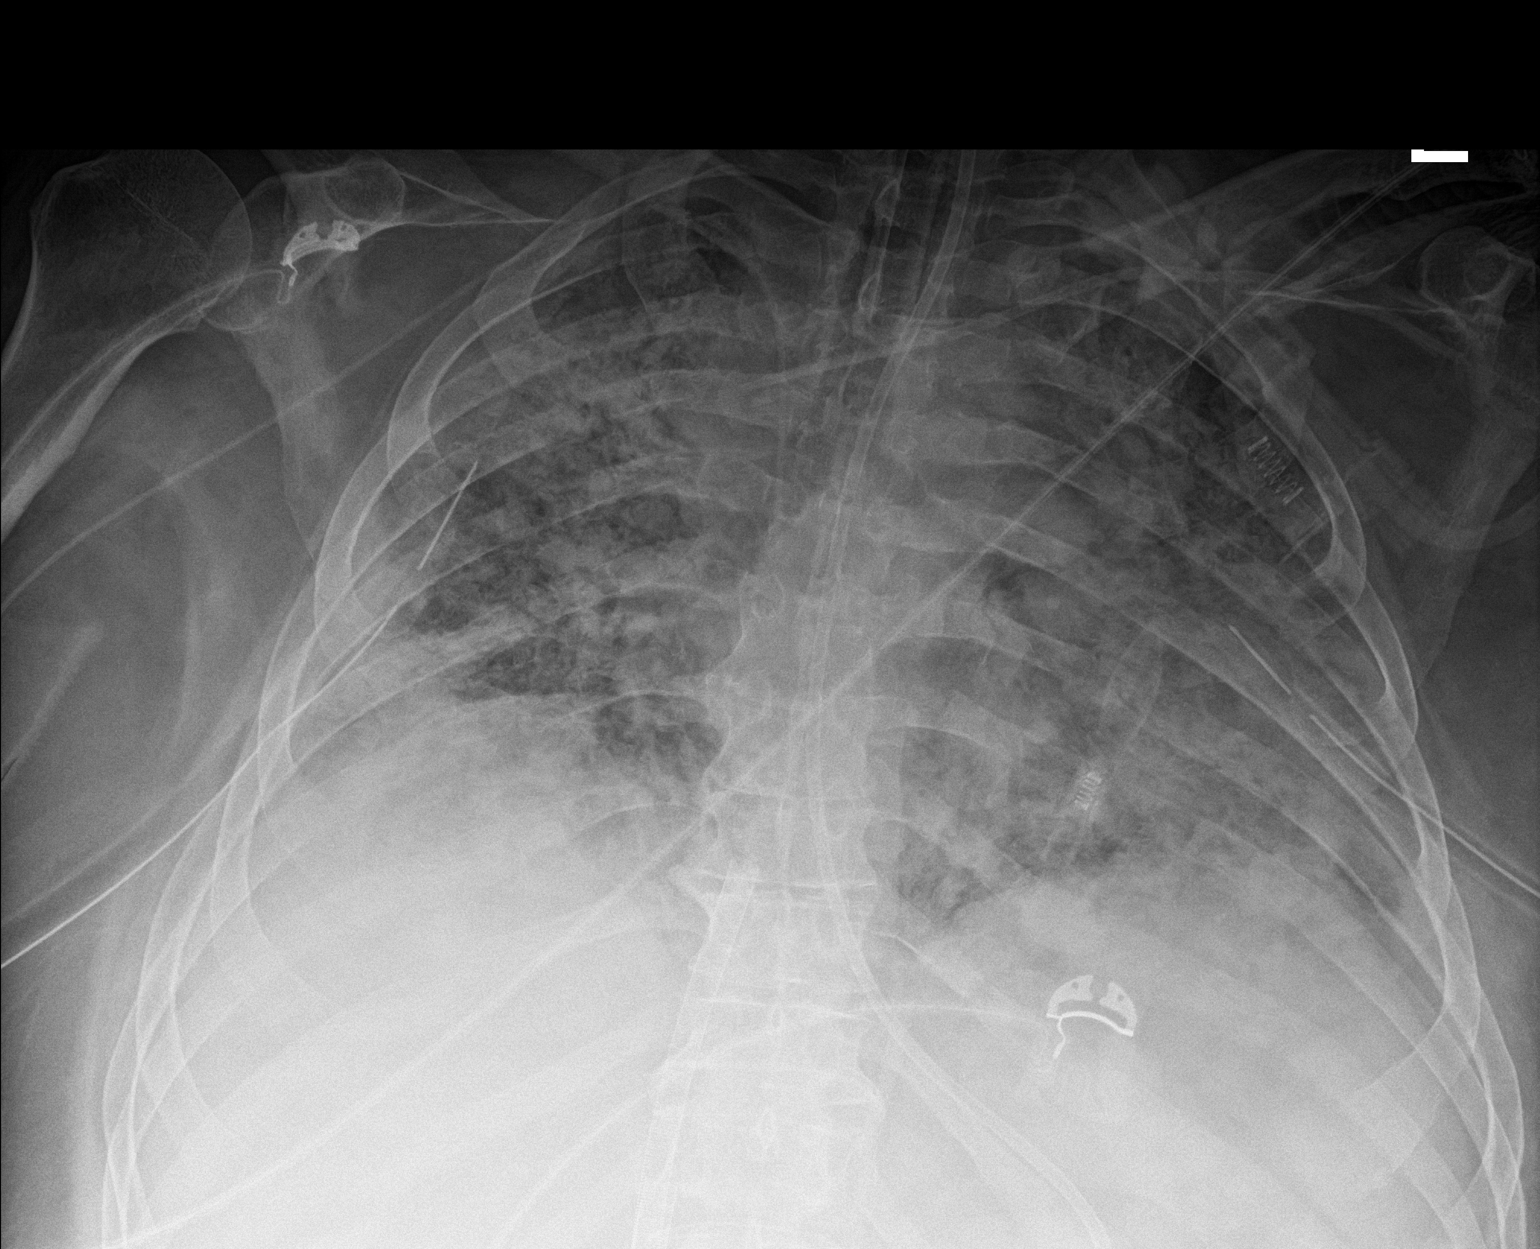

[1 of 1 positions shown; findings below may reference images not displayed]

FINDINGS: Right IJ ECMO has been removed. IVC cannula tip terminates at the
level of the lower right atrium. Endotracheal tube positioned 3.9 cm
above the carina. Feeding tube courses into the abdomen,
nonvisualization of the tip. Left subclavian central venous catheter
overlies the proximal-mid SVC, stable. Right PICC catheter overlies
the distal SVC, unchanged. Cardiomediastinal silhouette obscured,
but grossly stable.

Lungs hypoinflated. Bilateral chest tubes remain in place, stable.
No visible pneumothorax. No obvious large pleural effusion.
Extensive bilateral airspace opacity, little interval changed.
IMPRESSION: 1. Interval removal of right IJ ECMO cannula.
2. Remaining support apparatus in place as above. Bilateral chest
tubes in stable position without appreciable pneumothorax.
3. Extensive bilateral airspace opacities, little interval changed
from previous.

## 2021-11-18 ENCOUNTER — Encounter (HOSPITAL_BASED_OUTPATIENT_CLINIC_OR_DEPARTMENT_OTHER): Payer: BC Managed Care – PPO | Admitting: General Surgery

## 2021-11-18 DIAGNOSIS — Z8616 Personal history of COVID-19: Secondary | ICD-10-CM | POA: Diagnosis not present

## 2021-11-18 DIAGNOSIS — L97522 Non-pressure chronic ulcer of other part of left foot with fat layer exposed: Secondary | ICD-10-CM | POA: Diagnosis not present

## 2021-11-18 DIAGNOSIS — I10 Essential (primary) hypertension: Secondary | ICD-10-CM | POA: Diagnosis not present

## 2021-11-18 DIAGNOSIS — L97512 Non-pressure chronic ulcer of other part of right foot with fat layer exposed: Secondary | ICD-10-CM | POA: Diagnosis not present

## 2021-11-18 NOTE — Progress Notes (Addendum)
[name] ([id number]) ?Visit Report for 11/18/2021 ?Chief Complaint Document Details ?Patient Name: Date of Service: ?[name], [name] 11/18/2021 9:30 A M ?Medical Record Number: [id number] ?Patient Account Number: [id number] ?Date of Birth/Sex: Treating RN: ?[birth year] (46 y.o. [name]) [name] ?Primary Care Provider: [name] Other Clinician: ?Referring Provider: ?Treating Provider/Extender: Fredirick Maudlin ?[name] ?Weeks in Treatment: 70 ?Information Obtained from: Patient ?Chief Complaint ?Bilateral Plantar Foot Ulcers ?Electronic Signature(s) ?Signed: 11/18/2021 12:29:12 PM By: Fredirick Maudlin MD FACS ?Entered By: Fredirick Maudlin on 11/18/2021 12:29:12 ?-------------------------------------------------------------------------------- ?Debridement Details ?Patient Name: Date of Service: ?[name], [name] 11/18/2021 9:30 A M ?Medical Record Number: [id number] ?Patient Account Number: [id number] ?Date of Birth/Sex: Treating RN: ?[birth year] (46 y.o. [name]) [name] ?Primary Care Provider: [name] Other Clinician: ?Referring Provider: ?Treating Provider/Extender: Fredirick Maudlin ?[name] ?Weeks in Treatment: 70 ?Debridement Performed for Assessment: Wound #1 Right Calcaneus ?Performed By: Physician Fredirick Maudlin, MD ?Debridement Type: Debridement ?Level of Consciousness (Pre-procedure): Awake and Alert ?Pre-procedure Verification/Time Out Yes - 10:20 ?Taken: ?Start Time: 10:20 ?Pain Control: ?Other : benzocaine 20% spray ?T Area Debrided (L x W): ?otal 0.6 (cm) x 0.3 (cm) = 0.18 (cm?) ?Tissue and other material debrided: Non-Viable, Callus, Subcutaneous ?Level: Skin/Subcutaneous Tissue ?Debridement Description: Excisional ?Instrument: Curette ?Bleeding: Minimum ?Hemostasis Achieved: Pressure ?End Time: 10:23 ?Procedural Pain: 0 ?Post Procedural Pain: 0 ?Response to Treatment: Procedure was tolerated well ?Level of Consciousness (Post- Awake and Alert ?procedure): ?Post Debridement  Measurements of Total Wound ?Length: (cm) 0.6 ?Stage: Category/Stage III ?Width: (cm) 0.3 ?Depth: (cm) 0.2 ?Volume: (cm?) 0.028 ?Character of Wound/Ulcer Post Debridement: Improved ?Post Procedure Diagnosis ?Same as Pre-procedure ?Electronic Signature(s) ?Signed: 11/18/2021 1:06:36 PM By: Fredirick Maudlin MD FACS ?Signed: 11/18/2021 4:41:00 PM By: [name] ?Entered By: [name] on 11/18/2021 10:34:32 ?-------------------------------------------------------------------------------- ?Debridement Details ?Patient Name: ?Date of Service: ?[name], [place] E. 11/18/2021 9:30 A M ?Medical Record Number: [id number] ?Patient Account Number: [id number] ?Date of Birth/Sex: ?Treating RN: ?[birth year] (46 y.o. [name]) [name] ?Primary Care Provider: [name] ?Other Clinician: ?Referring Provider: ?Treating Provider/Extender: Fredirick Maudlin ?[name] ?Weeks in Treatment: 70 ?Debridement Performed for Assessment: Wound #2 Left Calcaneus ?Performed By: Physician Fredirick Maudlin, MD ?Debridement Type: Debridement ?Level of Consciousness (Pre-procedure): Awake and Alert ?Pre-procedure Verification/Time Out Yes - 10:20 ?Taken: ?Start Time: 10:20 ?Pain Control: ?Other : benzocaine 20% spray ?T Area Debrided (L x W): ?otal 3.3 (cm) x 1 (cm) = 3.3 (cm?) ?Tissue and other material debrided: Non-Viable, Callus, Slough, Subcutaneous, Slough ?Level: Skin/Subcutaneous Tissue ?Debridement Description: Excisional ?Instrument: Curette ?Bleeding: Minimum ?Hemostasis Achieved: Pressure ?End Time: 10:23 ?Procedural Pain: 0 ?Post Procedural Pain: 0 ?Response to Treatment: Procedure was tolerated well ?Level of Consciousness (Post- Awake and Alert ?procedure): ?Post Debridement Measurements of Total Wound ?Length: (cm) 3.3 ?Stage: Category/Stage III ?Width: (cm) 1 ?Depth: (cm) 0.3 ?Volume: (cm?) 0.778 ?Character of Wound/Ulcer Post Debridement: Improved ?Post Procedure Diagnosis ?Same as Pre-procedure ?Electronic  Signature(s) ?Signed: 11/18/2021 1:06:36 PM By: Fredirick Maudlin MD FACS ?Signed: 11/18/2021 4:41:00 PM By: [name] ?Entered By: [name] on 11/18/2021 10:35:30 ?-------------------------------------------------------------------------------- ?HPI Details ?Patient Name: ?Date of Service: ?[name], [place] E. 11/18/2021 9:30 A M ?Medical Record Number: [id number] ?Patient Account Number: [id number] ?Date of Birth/Sex: ?Treating RN: ?11-Dec-1975 (46 y.o. [name]) [name] ?Primary Care Provider: [name] Other Clinician: ?Referring Provider: ?Treating Provider/Extender: Fredirick Maudlin ?[name] ?Weeks in Treatment: 70 ?History of Present Illness ?HPI Description: Wounds are12/03/2020 upon evaluation today patient presents for initial inspection here  in our clinic concerning issues he has been having ?with the bottoms of his feet bilaterally. He states these actually occurred as wounds when he was hospitalized for 5 months secondary to Covid. He was ?apparently with tilting bed where he was in an upright position quite frequently and apparently this occurred in some way shape or form during that time. ?Fortunately there is no sign of active infection at this time. No fevers, chills, nausea, vomiting, or diarrhea. With that being said he still has substantial wounds ?on the plantar aspects of his feet Theragen require quite a bit of work to get these to heal. He has been using Santyl currently though that is been problematic ?both in receiving the medication as well as actually paid for it as it is become quite expensive. Prior to the experience with Covid the patient really did not have ?any major medical problems other than hypertension he does have some mild generalized weakness following the Covid experience. ?07/22/2020 on evaluation today patient appears to be doing okay in regard to his foot ulcers I feel like the wound beds are showing signs of better ?improvement that I do believe the Iodoflex  is helping in this regard. With that being said he does have a lot of drainage currently and this is somewhat ?blue/green in nature which is consistent with Pseudomonas. I do think a culture today would be appropriate for [place] to evaluate and see if that is indeed the case ?I would likely start him on antibiotic orally as well he is not allergic to Cipro knows of no issues he has had in the past ?12/21; patient was admitted to the clinic earlier this month with bilateral presumed pressure ulcers on the bottom of his feet apparently related to excessive ?pressure from a tilt table arrangement in the intensive care unit. Patient relates this to being on ECMO but I am not really sure that is exactly related to that. I ?must say I have never seen anything like this. He has fairly extensive full-thickness wounds extending from his heel towards his midfoot mostly centered ?laterally. There is already been some healing distally. He does not appear to have an arterial issue. He has been using gentamicin to the wound surfaces with ?Iodoflex to help with ongoing debridement ?1/6; this is a patient with pressure ulcers on the bottom of his feet related to excessive pressure from a standing position in the intensive care unit. He is ?complaining of a lot of pain in the right heel. He is not a diabetic. 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 We have been using ?Iodoflex to help prepare the surfaces of both wounds for an advanced treatment product. He is nonambulatory spending most of his time in a wheelchair I ?have asked him not to propel the wheelchair with his heels ?1/13; in general his wounds look better not much surface area change we have been using Iodoflex as of last week. ?I did an x-ray of the right heel as the patient was complaining of pain. I had some thoughts about a stress fracture perhaps Achilles tendon problems however ?what it showed was erosive changes along the inferior aspect of  the calcaneus he now has a MRI booked for 1/20. ?1/20; in general his wounds continue to be better. Some improvement in the large narrow areas proximally in his foot. He is still complaining of pain in the right ?heel and t

## 2021-11-18 NOTE — Progress Notes (Signed)
DEQUAVIOUS, HARSHBERGER ([id number]) ?Visit Report for 11/18/2021 ?Arrival Information Details ?Patient Name: Date of Service: ?[name], [name] 11/18/2021 9:30 A M ?Medical Record Number: [id number] ?Patient Account Number: [id number] ?Date of Birth/Sex: Treating RN: ?08-Sep-1975 (46 y.o. [name]) [name] ?Primary Care Khala Tarte: [name] Other Clinician: ?Referring Mylez Venable: ?Treating Tranell Wojtkiewicz/Extender: Fredirick Maudlin ?[name] ?Weeks in Treatment: 70 ?Visit Information History Since Last Visit ?Added or deleted any medications: No ?Patient Arrived: Wheel Chair ?Any new allergies or adverse reactions: No ?Arrival Time: 09:34 ?Had a fall or experienced change in No ?Accompanied By: [id number] ?activities of daily living that may affect ?Transfer Assistance: Stretcher ?risk of falls: ?Patient Identification Verified: Yes ?Signs or symptoms of abuse/neglect since last visito No ?Patient Requires Transmission-Based Precautions: No ?Hospitalized since last visit: No ?Patient Has Alerts: No ?Implantable device outside of the clinic excluding No ?cellular tissue based products placed in the center ?since last visit: ?Has Dressing in Place as Prescribed: Yes ?Pain Present Now: Yes ?Electronic Signature(s) ?Signed: 11/18/2021 4:41:00 PM By: [name] ?Entered By: [name] on 11/18/2021 09:39:16 ?-------------------------------------------------------------------------------- ?Encounter Discharge Information Details ?Patient Name: Date of Service: ?[name], [name] 11/18/2021 9:30 A M ?Medical Record Number: [id number] ?Patient Account Number: [id number] ?Date of Birth/Sex: Treating RN: ?[birth year] (46 y.o. [name]) [name] ?Primary Care Annita Ratliff: [name] Other Clinician: ?Referring Viveca Beckstrom: ?Treating Tatsuya Okray/Extender: Fredirick Maudlin ?[name] ?Weeks in Treatment: 70 ?Encounter Discharge Information Items Post Procedure Vitals ?Discharge Condition: Stable ?Temperature (F): 98.2 ?Ambulatory Status:  Wheelchair ?Pulse (bpm): 68 ?Discharge Destination: Home ?Respiratory Rate (breaths/min): 16 ?Transportation: Private Auto ?Blood Pressure (mmHg): 144/87 ?Accompanied By: spouse ?Schedule Follow-up Appointment: Yes ?Clinical Summary of Care: Patient Declined ?Electronic Signature(s) ?Signed: 11/18/2021 4:41:00 PM By: [name] ?Entered By: [name] on 11/18/2021 16:33:58 ?-------------------------------------------------------------------------------- ?Lower Extremity Assessment Details ?Patient Name: ?Date of Service: ?[name], [place] E. 11/18/2021 9:30 A M ?Medical Record Number: [id number] ?Patient Account Number: [id number] ?Date of Birth/Sex: ?Treating RN: ?[birth year] (46 y.o. [name]) [name] ?Primary Care Taiylor Virden: [name] ?Other Clinician: ?Referring Alletta Mattos: ?Treating Jelan Batterton/Extender: Fredirick Maudlin ?[name] ?Weeks in Treatment: 70 ?Edema Assessment ?Assessed: [Left: No] [Right: No] ?Edema: [Left: No] [Right: No] ?Calf ?Left: Right: ?Point of Measurement: 29 cm From Medial Instep 40.1 cm 44 cm ?Ankle ?Left: Right: ?Point of Measurement: 9 cm From Medial Instep 25.1 cm 24 cm ?Electronic Signature(s) ?Signed: 11/18/2021 4:41:00 PM By: [name] ?Entered By: [name] on 11/18/2021 09:44:54 ?-------------------------------------------------------------------------------- ?Multi Wound Chart Details ?Patient Name: ?Date of Service: ?[name], [place] E. 11/18/2021 9:30 A M ?Medical Record Number: [id number] ?Patient Account Number: [id number] ?Date of Birth/Sex: ?Treating RN: ?Dec 09, 1975 (46 y.o. [name]) [name] ?Primary Care Yusef Lamp: [name] ?Other Clinician: ?Referring Palmira Stickle: ?Treating Kaidyn Hernandes/Extender: Fredirick Maudlin ?[name] ?Weeks in Treatment: 70 ?Vital Signs ?Height(in): 69 ?Pulse(bpm): 68 ?Weight(lbs): 280 ?Blood Pressure(mmHg): 144/87 ?Body Mass Index(BMI): 41.3 ?Temperature(??F): 98.2 ?Respiratory Rate(breaths/min): 16 ?Photos: [N/A:N/A] ?Right  Calcaneus Left Calcaneus N/A ?Wound Location: ?Pressure Injury Pressure Injury N/A ?Wounding Event: ?Pressure Ulcer Pressure Ulcer N/A ?Primary Etiology: ?Asthma, Angina, Hypertension Asthma, Angina, Hypertension N/A ?Comorbid History: ?10/07/2019 10/07/2019 N/A ?Date Acquired: ?7 70 N/A ?Weeks of Treatment: ?Open Open N/A ?Wound Status: ?No No N/A ?Wound Recurrence: ?0.6x0.3x0.2 3.3x1x0.3 N/A ?Measurements L x W x D (cm) ?0.141 2.592 N/A ?A (cm?) : ?rea ?0.028 0.778 N/A ?Volume (cm?) : ?99.60% 90.40% N/A ?% Reduction in A rea: ?99.90% 97.10% N/A ?% Reduction in Volume: ?Category/Stage III Category/Stage III N/A ?Classification: ?Medium  Medium N/A ?Exudate A mount: ?Serosanguineous Serosanguineous N/A ?Exudate Type: ?red, brown red, brown N/A ?Exudate Color: ?Distinct, outline attached Distinct, outline attached N/A ?Wound Margin: ?Small (1-33%) Small (1-33%) N/A ?Granulation A mount: ?Pink Red N/A ?Granulation Quality: ?Large (67-100%) Large (67-100%) N/A ?Necrotic A mount: ?Eschar, Adherent [place] N/A ?Necrotic Tissue: ?Fat Layer (Subcutaneous Tissue): Yes Fat Layer (Subcutaneous Tissue): Yes N/A ?Exposed Structures: ?Fascia: No ?Fascia: No ?Tendon: No ?Tendon: No ?Muscle: No ?Muscle: No ?Joint: No ?Joint: No ?Bone: No ?Bone: No ?None Small (1-33%) N/A ?Epithelialization: ?Debridement - Excisional Debridement - Excisional N/A ?Debridement: ?Pre-procedure Verification/Time Out 10:20 10:20 N/A ?Taken: ?Other Other N/A ?Pain Control: ?Callus, Subcutaneous Callus, Subcutaneous, Slough N/A ?Tissue Debrided: ?Skin/Subcutaneous Tissue Skin/Subcutaneous Tissue N/A ?Level: ?0.18 3.3 N/A ?Debridement A (sq cm): ?rea ?Curette Curette N/A ?Instrument: ?Minimum Minimum N/A ?Bleeding: ?Pressure Pressure N/A ?Hemostasis A chieved: ?0 0 N/A ?Procedural Pain: ?0 0 N/A ?Post Procedural Pain: ?Procedure was tolerated well Procedure was tolerated well N/A ?Debridement Treatment Response: ?0.6x0.3x0.2 3.3x1x0.3  N/A ?Post Debridement Measurements L x ?W x D (cm) ?0.028 0.778 N/A ?Post Debridement Volume: (cm?) ?Category/Stage III Category/Stage III N/A ?Post Debridement Stage: ?Debridement Debridement N/A ?Procedures Performed: ?T Contact Cast ?otal ?Treatment Notes ?Electronic Signature(s) ?Signed: 11/18/2021 12:28:54 PM By: Fredirick Maudlin MD FACS ?Signed: 11/18/2021 4:41:00 PM By: [name] ?Entered By: Fredirick Maudlin on 11/18/2021 12:28:54 ?-------------------------------------------------------------------------------- ?Multi-Disciplinary Care Plan Details ?Patient Name: ?Date of Service: ?[name], [place] E. 11/18/2021 9:30 A M ?Medical Record Number: [id number] ?Patient Account Number: [id number] ?Date of Birth/Sex: ?Treating RN: ?February 27, 1976 (46 y.o. [name]) [name] ?Primary Care Russel Morain: [name] ?Other Clinician: ?Referring Tranise Forrest: ?Treating Vincente Asbridge/Extender: Fredirick Maudlin ?[name] ?Weeks in Treatment: 70 ?Multidisciplinary Care Plan reviewed with physician ?Active Inactive ?Wound/Skin Impairment ?Nursing Diagnoses: ?Impaired tissue integrity ?Knowledge deficit related to ulceration/compromised skin integrity ?Goals: ?Patient/caregiver will verbalize understanding of skin care regimen ?Date Initiated: 07/15/2020 ?Target Resolution Date: 12/03/2021 ?Goal Status: Active ?Ulcer/skin breakdown will have a volume reduction of 30% by week 4 ?Date Initiated: 07/15/2020 ?Date Inactivated: 08/20/2020 ?Target Resolution Date: 09/03/2020 ?Goal Status: Unmet ?Unmet Reason: no major changes. ?Ulcer/skin breakdown will heal within 14 weeks ?Date Initiated: 12/04/2020 ?Date Inactivated: 12/10/2020 ?Target Resolution Date: 12/10/2020 ?Unmet Reason: wounds still open at 14 ?Goal Status: Unmet ?weeks and today 21 weeks. ?Interventions: ?Assess patient/caregiver ability to obtain necessary supplies ?Assess patient/caregiver ability to perform ulcer/skin care regimen upon admission and as needed ?Assess ulceration(s) every  visit ?Provide education on ulcer and skin care ?Treatment Activities: ?Skin care regimen initiated : 07/15/2020 ?Topical wound management initiated : 07/15/2020 ?Notes: ?Electronic Signature(s) ?Signed: 11/18/2021 4:41

## 2021-11-19 IMAGING — DX DG CHEST 1V PORT
1 series · 1 of 1 positions shown · non-contrast
Comparison: Yesterday

CLINICAL DATA: Cardia respiratory failure

EXAM:
PORTABLE CHEST 1 VIEW

[chest ap]
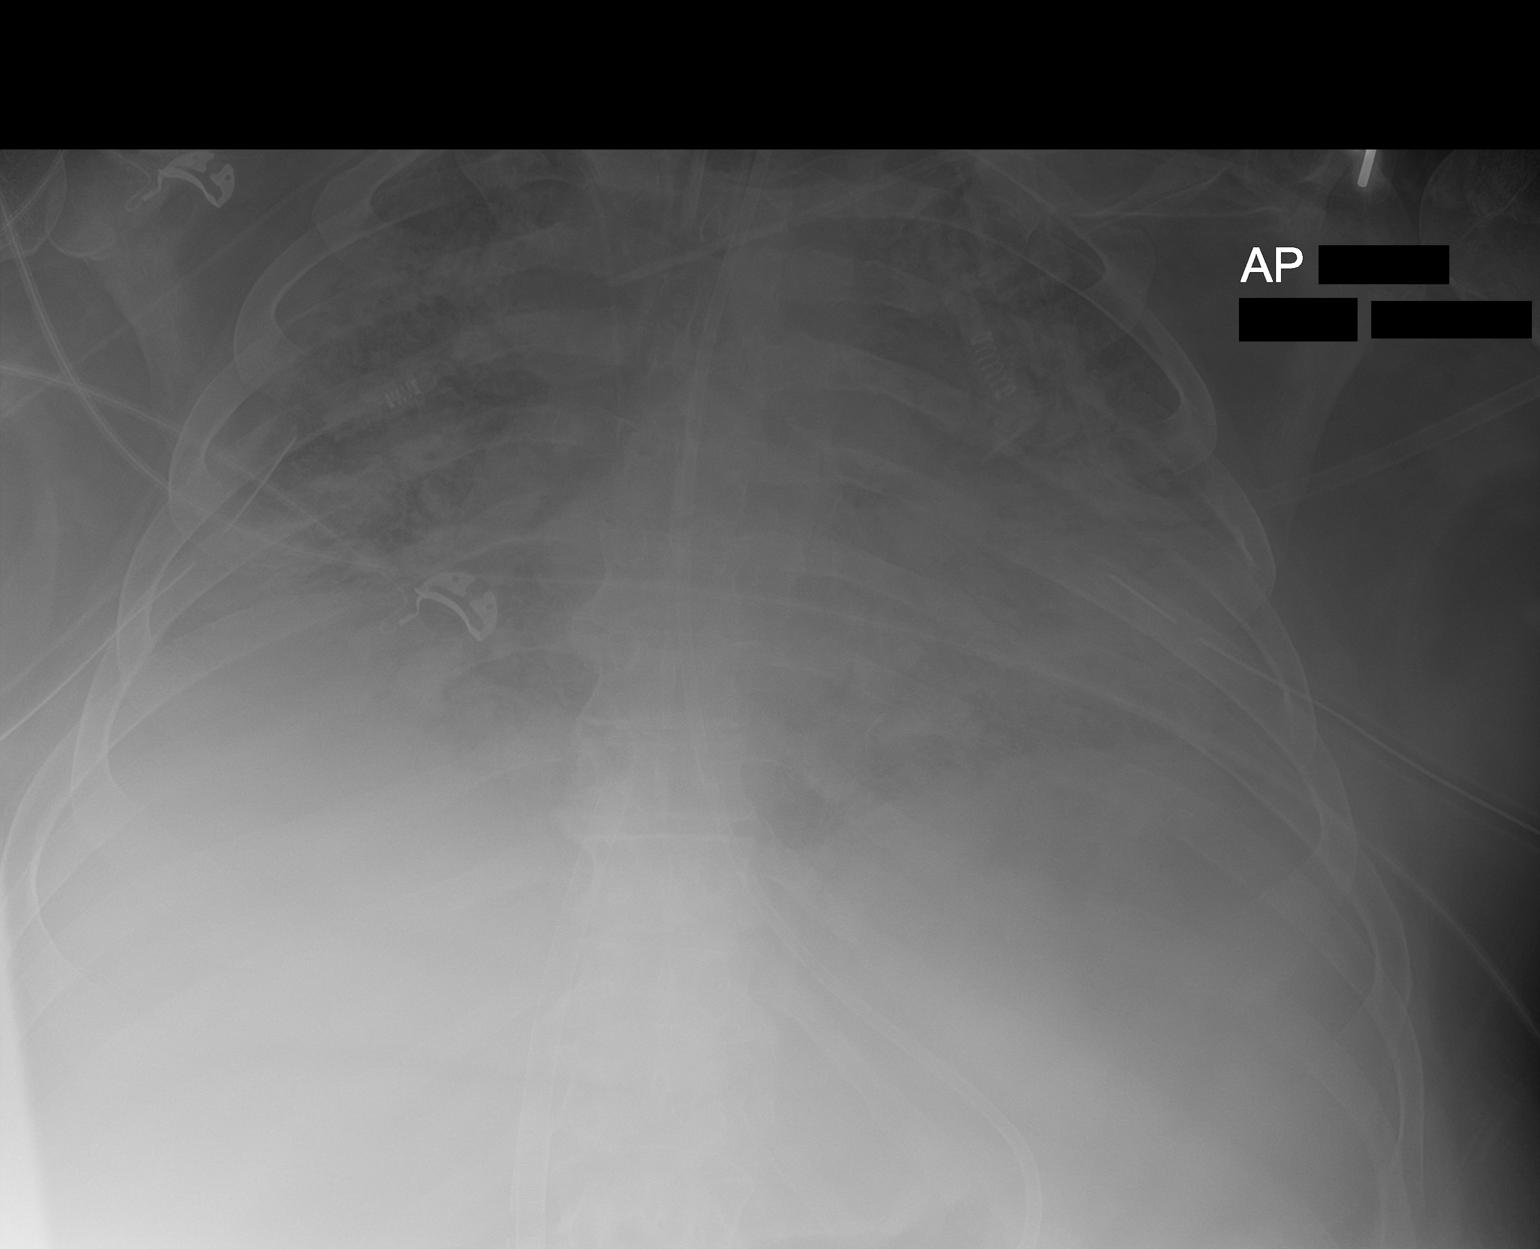

[1 of 1 positions shown; findings below may reference images not displayed]

FINDINGS: Widespread dense pulmonary opacification in this patient on ECMO.
The IVC cannula is in stable position. Bilateral central lines in
stable place. Tracheostomy tube and feeding tube in place. Bilateral
chest tube in stable position with no visible pneumothorax.
IMPRESSION: Stable hardware positioning and pulmonary opacity. No visible
pneumothorax.

## 2021-11-19 IMAGING — DX DG CHEST 1V PORT
2 series · 2 of 2 positions shown · non-contrast
Comparison: 09/09/2019 at [DATE] a.m.

CLINICAL DATA: Acute respiratory failure, ECMO

EXAM:
PORTABLE CHEST 1 VIEW

[chest ap (1 of 2)]
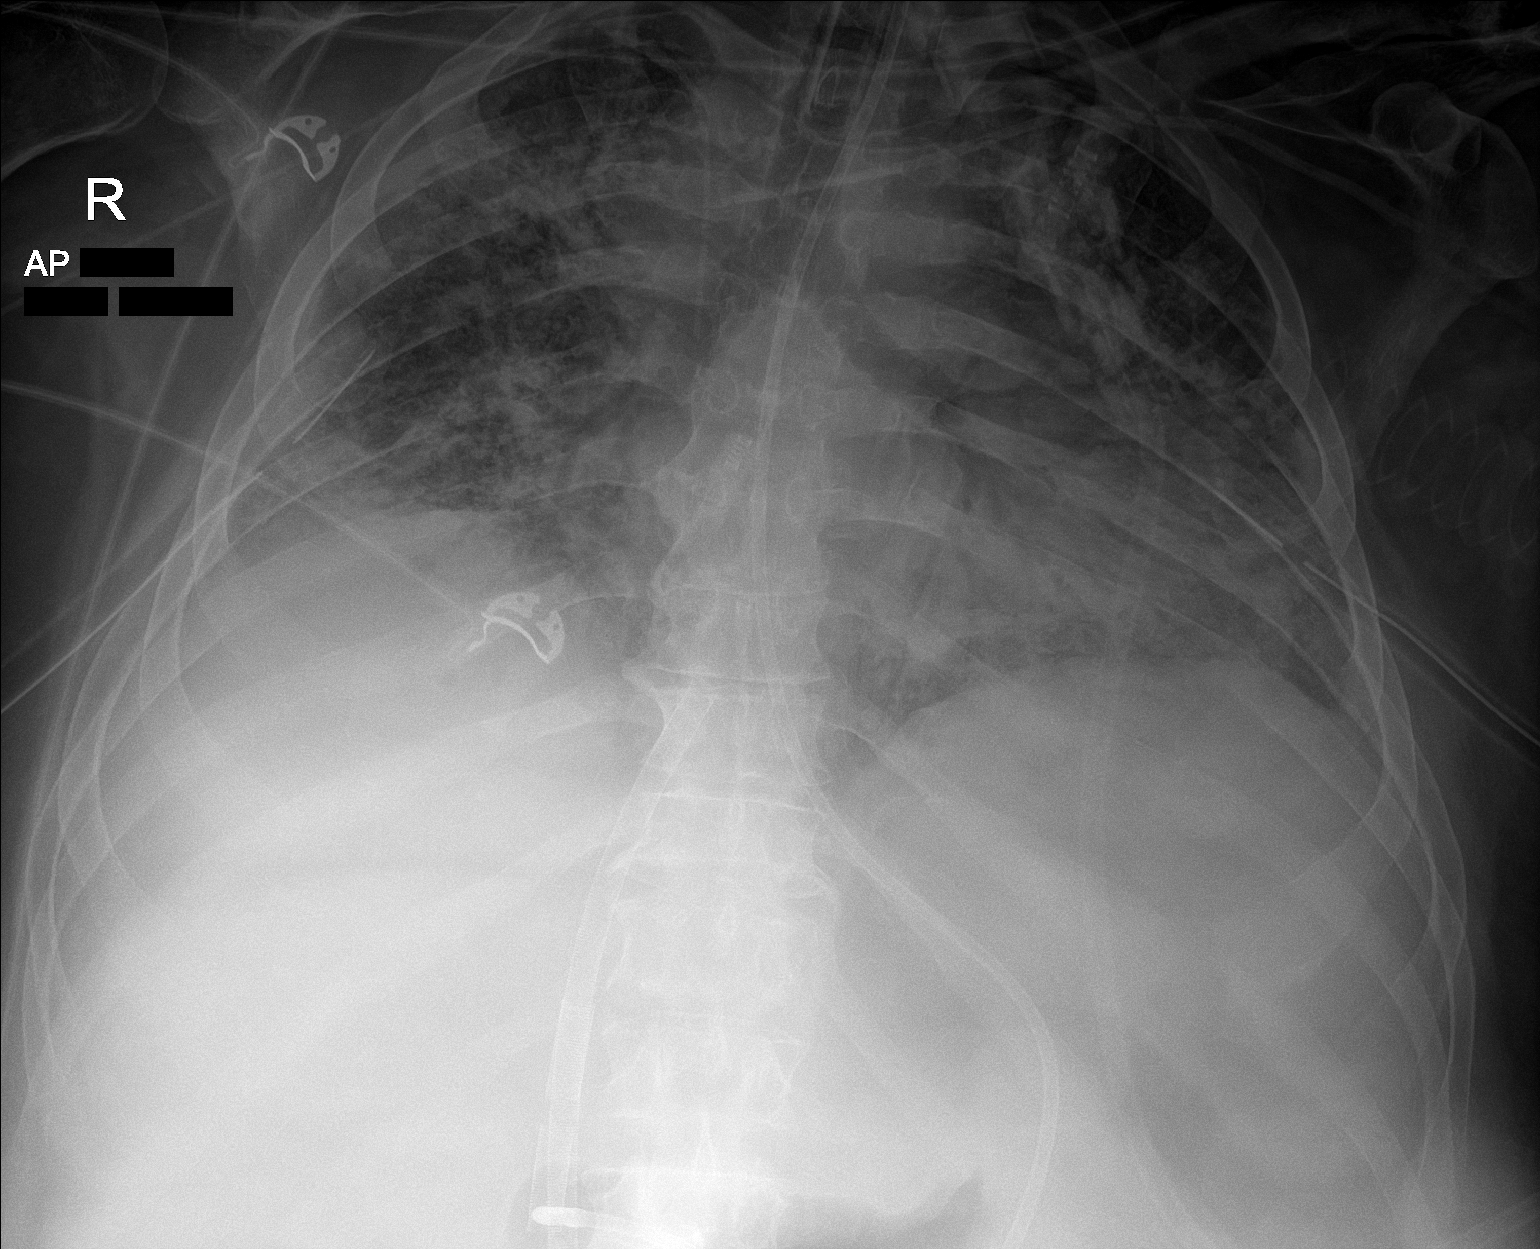

[chest ap (2 of 2)]
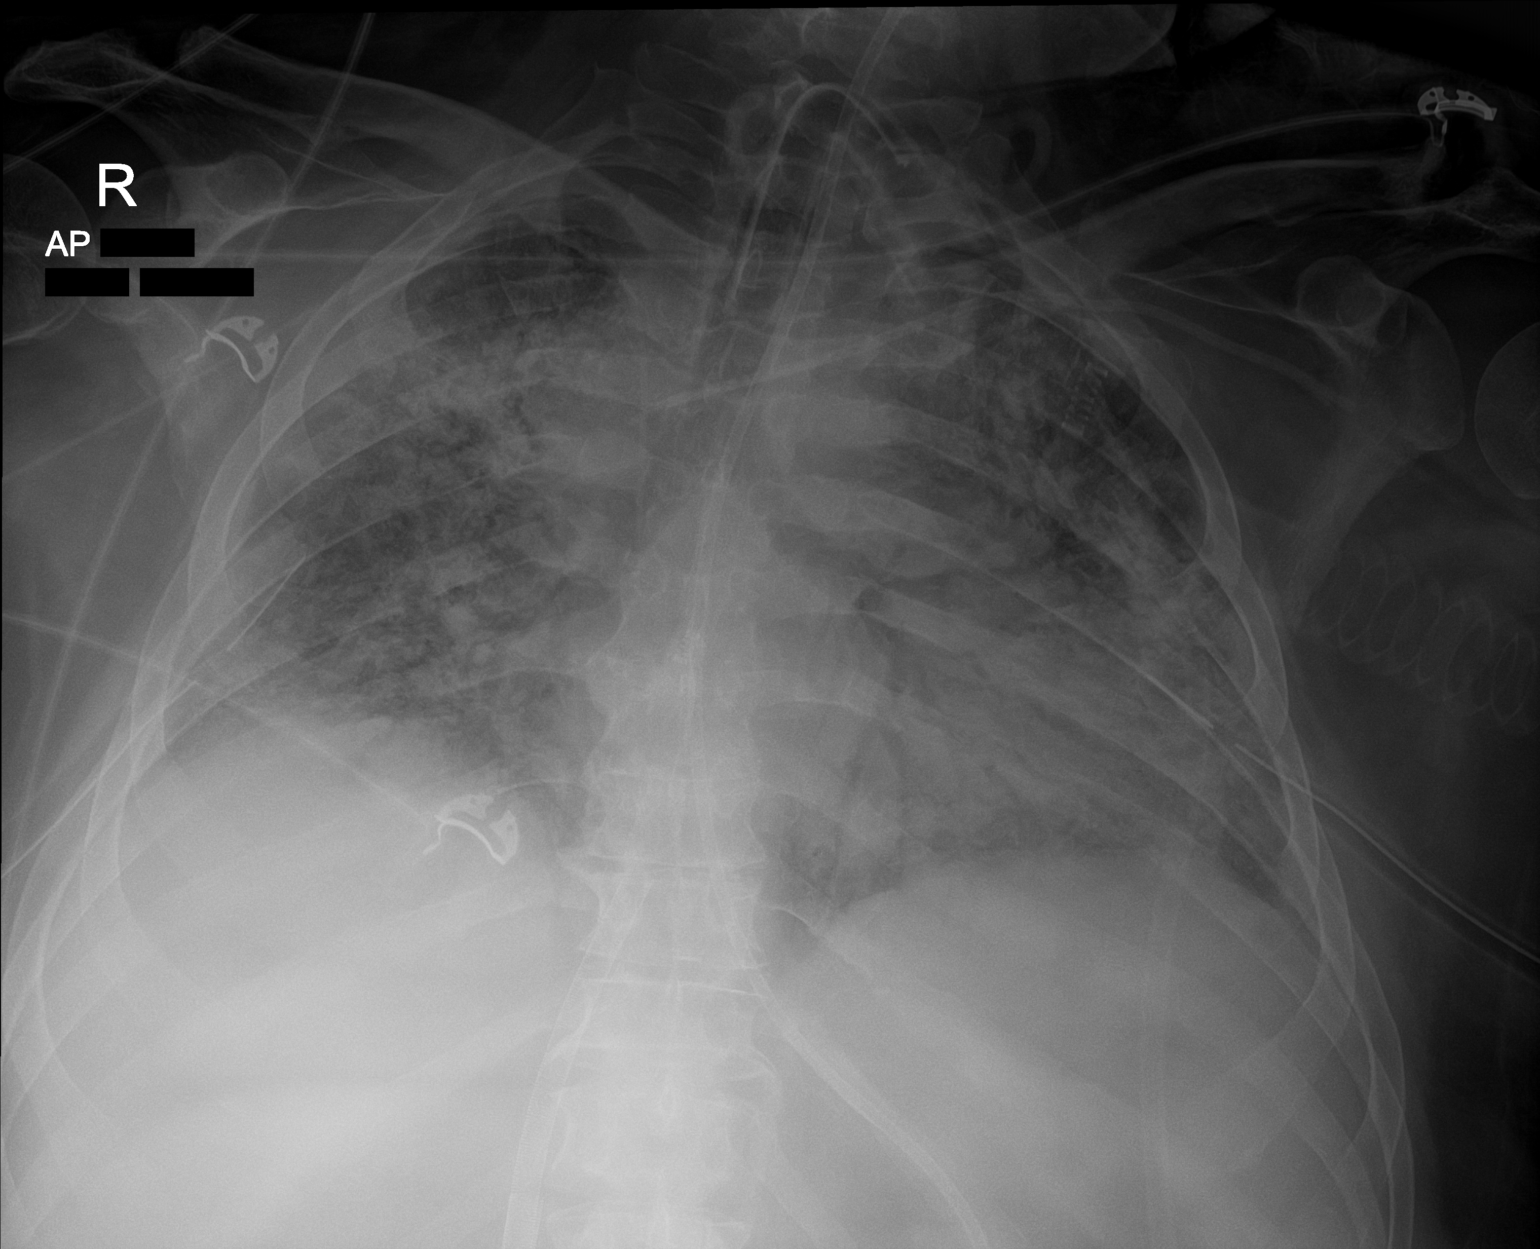

[2 of 2 positions shown; findings below may reference images not displayed]

FINDINGS: Two frontal views of the chest are obtained. Tracheostomy tube,
enteric catheter, left subclavian catheter, bilateral chest tubes,
and IVC catheter unchanged. Persistent widespread airspace disease
throughout the lungs in this patient on ECMO. Interval development
of trace left apical pneumothorax, volume estimate of far less than
5%. No evidence for effusion.
IMPRESSION: 1. Interval development of trace left apical pneumothorax volume
estimated far less than 5%.
2. Otherwise stable exam.

These results will be called to the ordering clinician or
representative by the Radiologist Assistant, and communication
documented in the PACS or zVision Dashboard.

## 2021-11-20 IMAGING — DX DG CHEST 1V
1 series · 1 of 1 positions shown · non-contrast
Comparison: Prior study same day.  Prior study 09/09/2019.

CLINICAL DATA: History of heart surgery.  ECMO.

EXAM:
CHEST  1 VIEW

[chest ap]
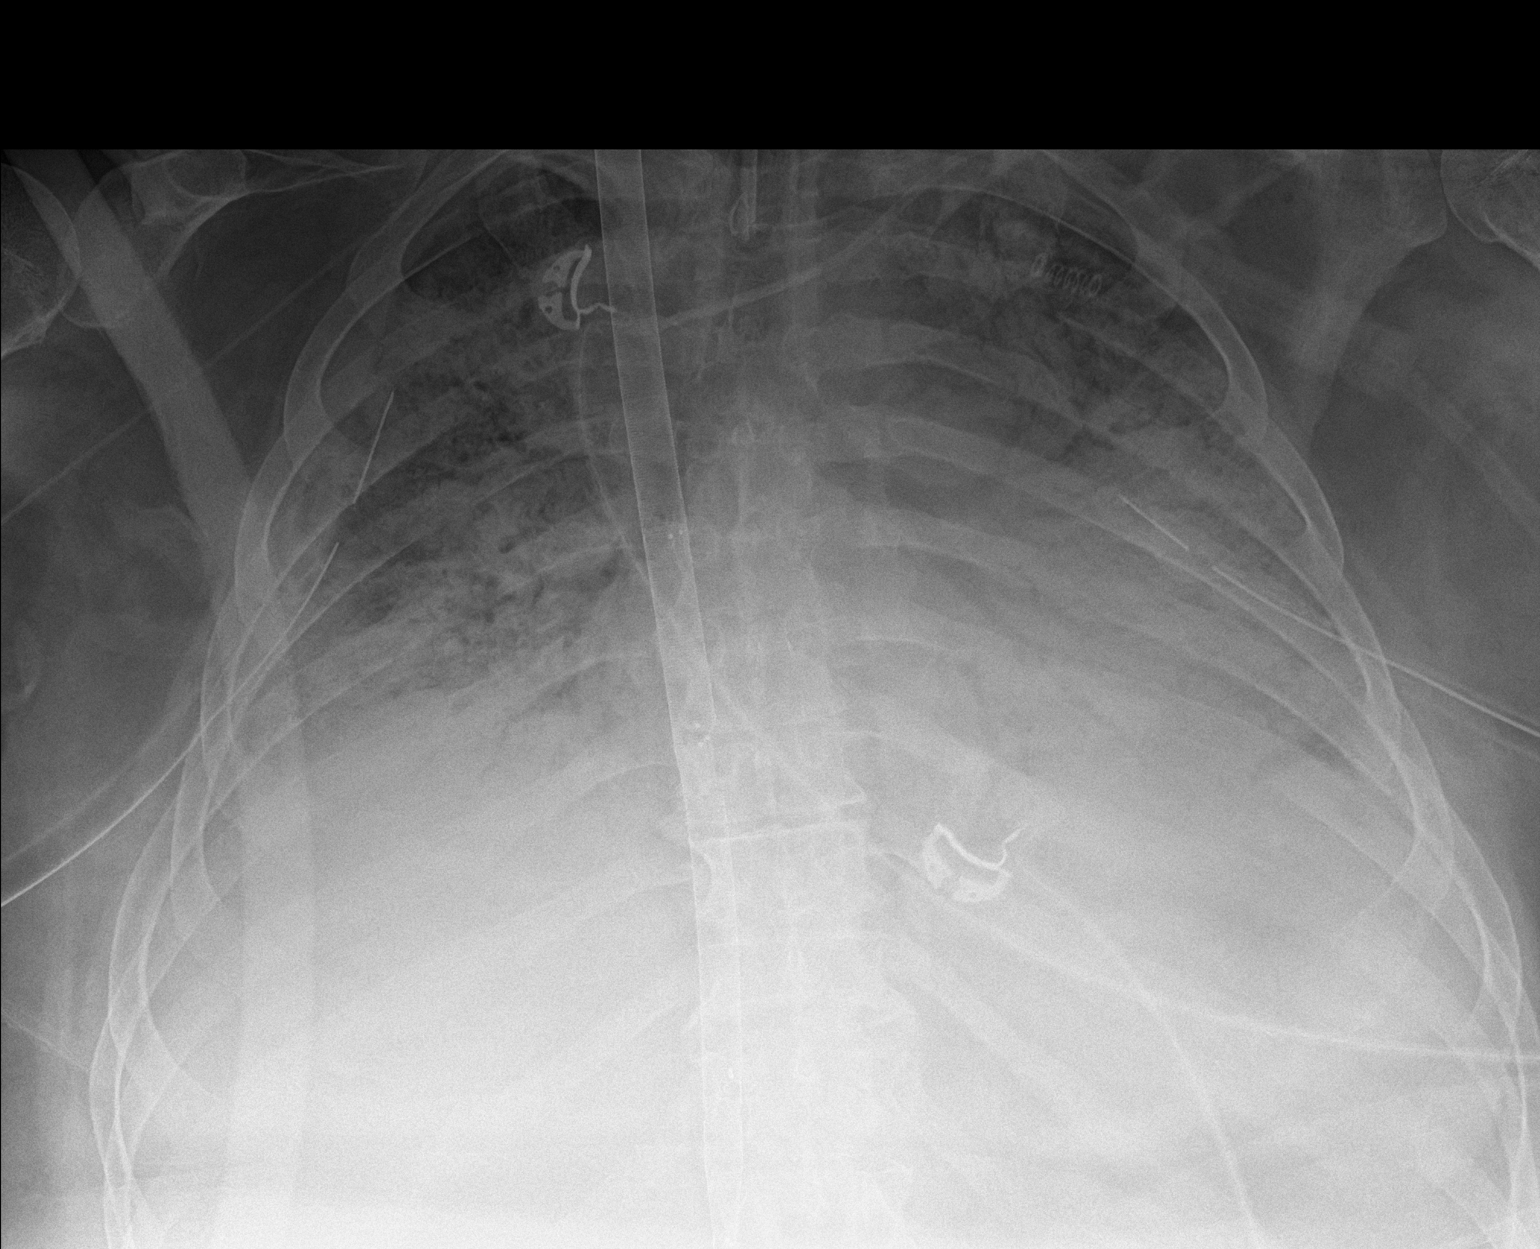

[1 of 1 positions shown; findings below may reference images not displayed]

FINDINGS: Endotracheal tube, left subclavian left, right PICC line, bilateral
chest tubes in stable position. ECMO device noted projected over the
right chest. Cardiomegaly. Diffuse severe bilateral pulmonary
infiltrates are again noted. No prominent pleural effusion noted. No
pneumothorax.
IMPRESSION: 1. Lines and tubes as above. Bilateral chest tubes in stable
position. No pneumothorax.

2.  Stable cardiomegaly.

3.  Diffuse unchanged dense bilateral pulmonary infiltrates/edema.

## 2021-11-20 IMAGING — DX DG CHEST 1V PORT
1 series · 1 of 1 positions shown · non-contrast
Comparison: Same day.

CLINICAL DATA: Central line placement.

EXAM:
PORTABLE CHEST 1 VIEW

[chest]
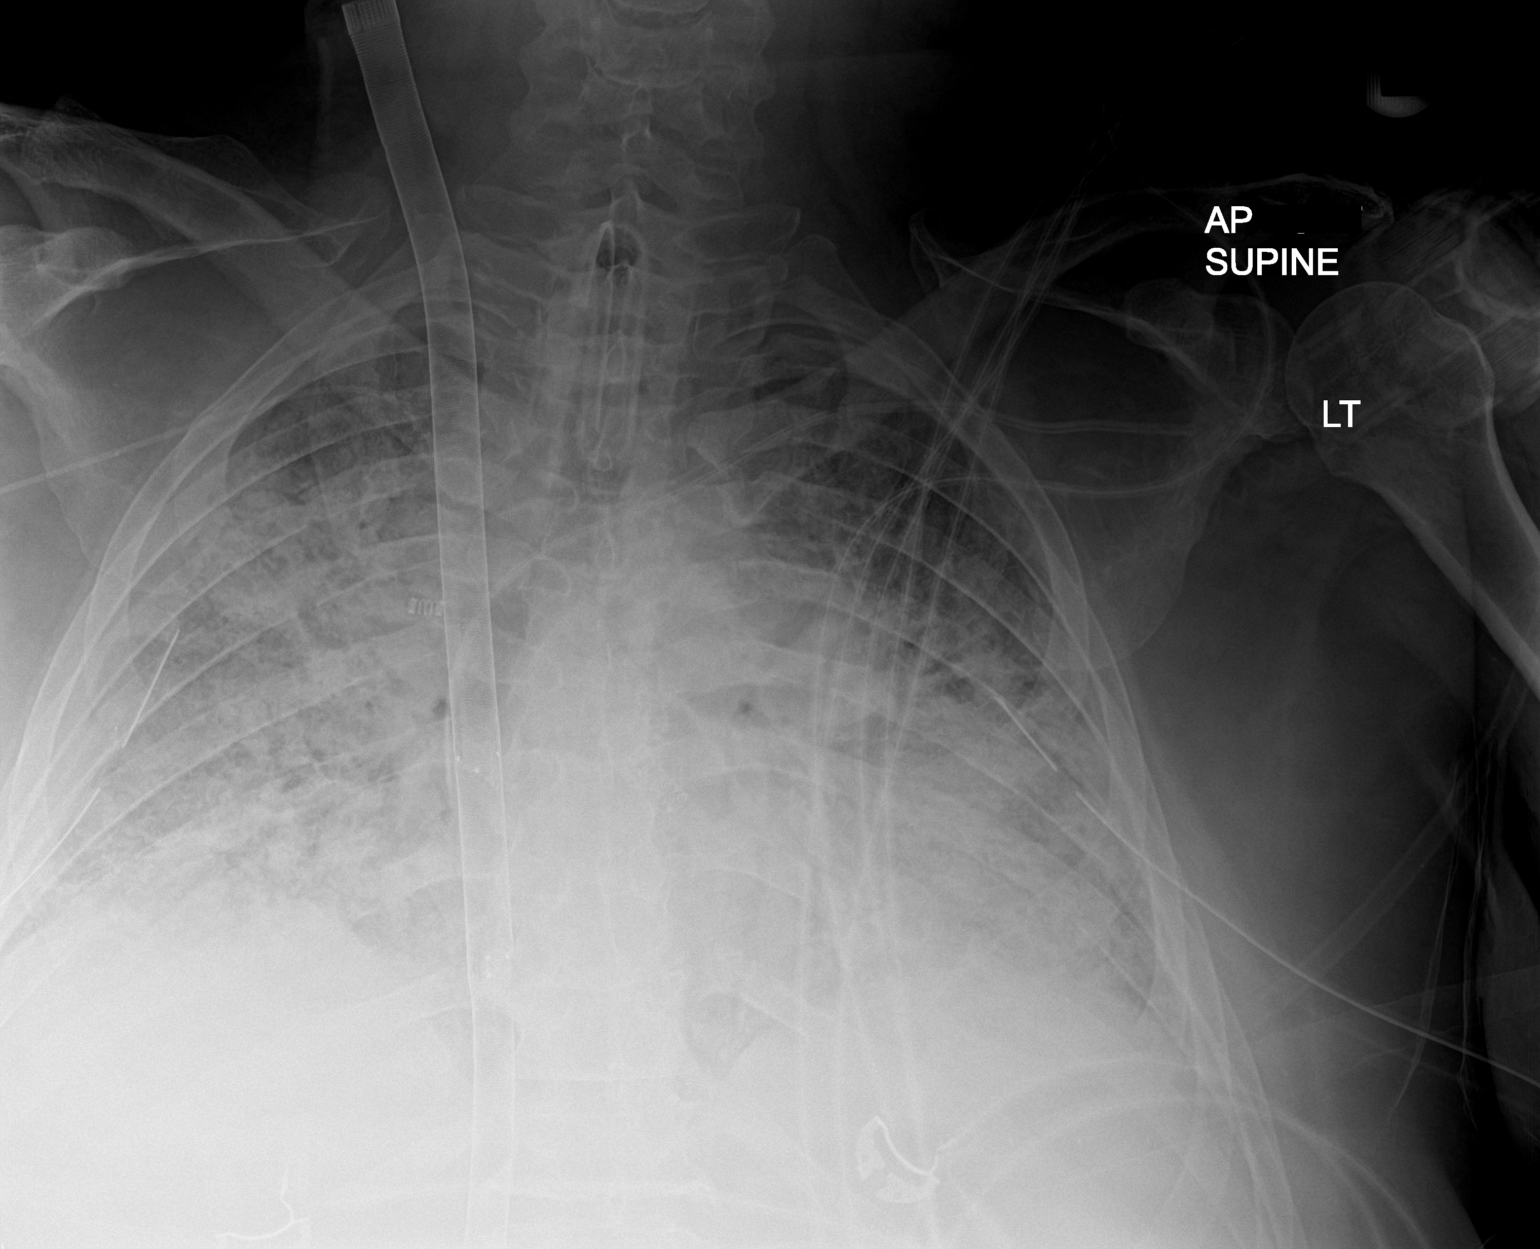

[1 of 1 positions shown; findings below may reference images not displayed]

FINDINGS: Stable cardiomediastinal silhouette. Tracheostomy tube is unchanged
in position. Left subclavian catheter is unchanged. Bilateral chest
tubes are noted without pneumothorax. Stable bilateral diffuse lung
opacities are noted most consistent with multifocal pneumonia or
edema. Bony thorax is unremarkable.
IMPRESSION: 1. Stable support apparatus. Stable bilateral lung opacities as
described above.
2. No evidence of pneumothorax.

## 2021-11-21 IMAGING — DX DG CHEST 1V PORT
1 series · 1 of 1 positions shown · non-contrast
Comparison: 09/10/2019

CLINICAL DATA: 43-year-old male with a history of COVID pneumonia
and ECMO

EXAM:
PORTABLE CHEST 1 VIEW

[chest ap]
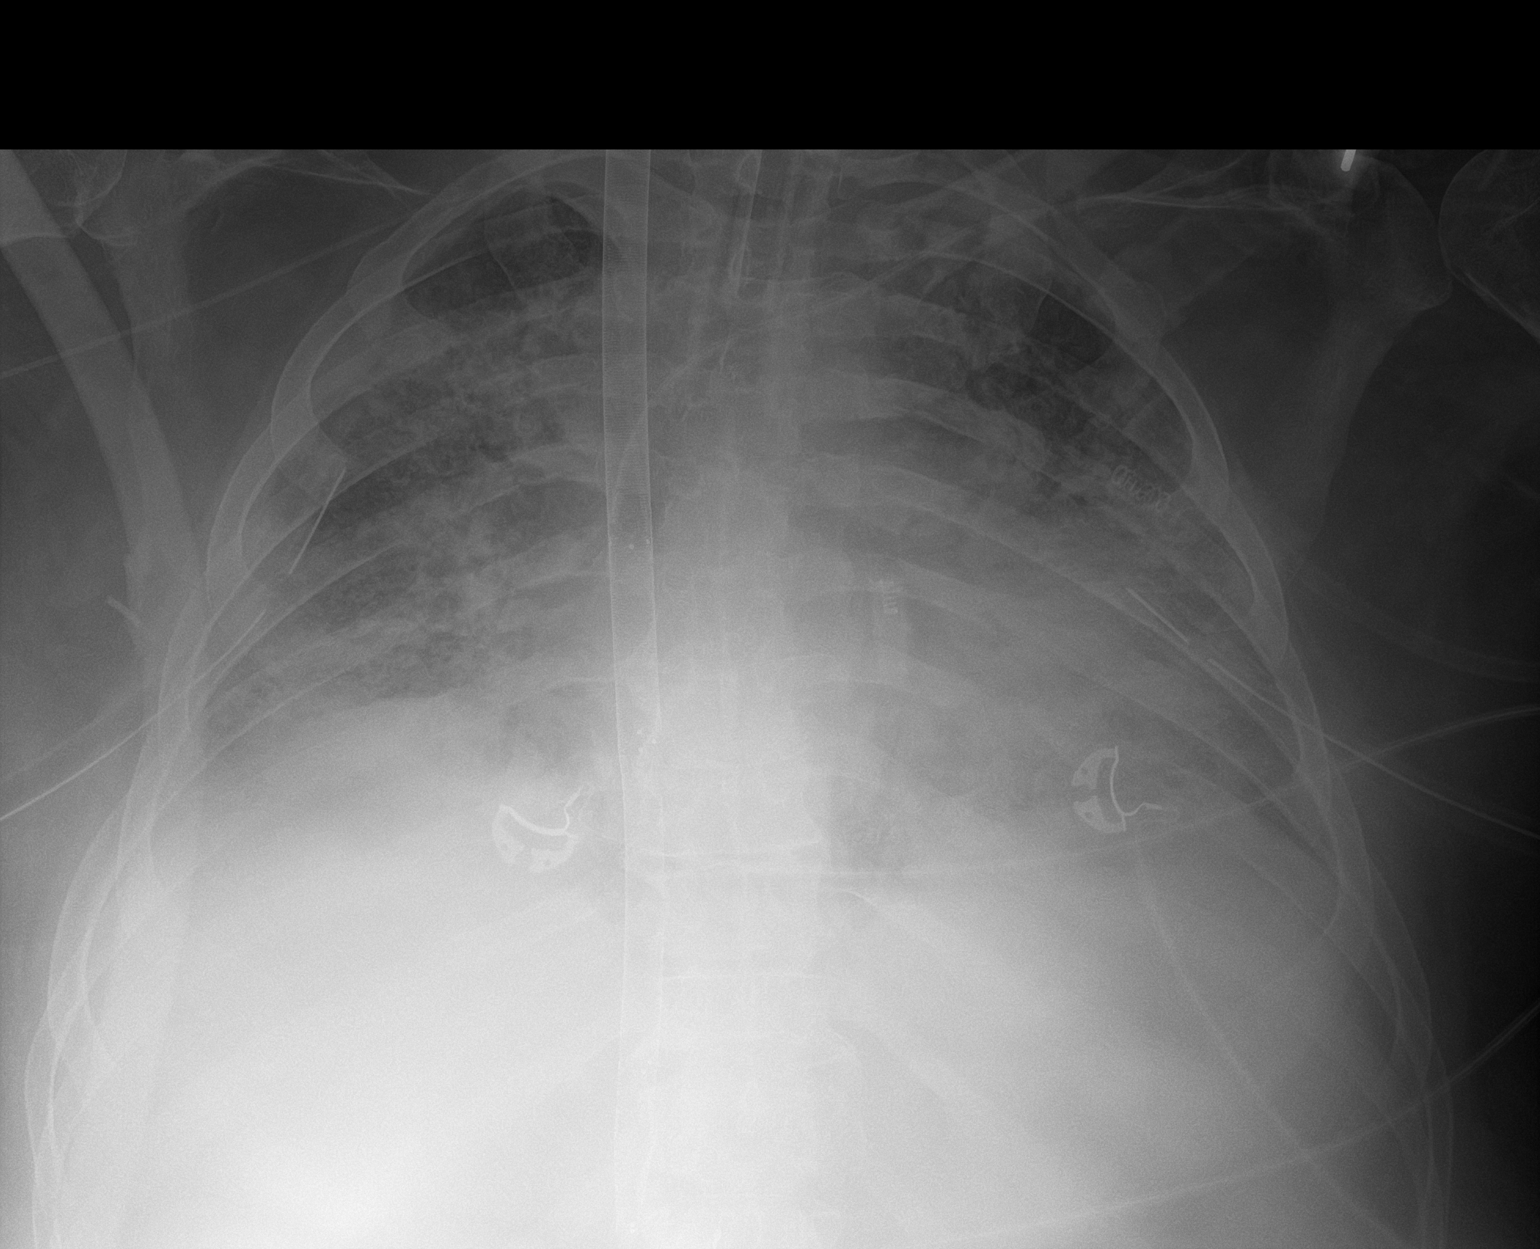

[1 of 1 positions shown; findings below may reference images not displayed]

FINDINGS: Cardiomediastinal silhouette likely unchanged with cardiomegaly.

Low lung volumes persist with bilateral reticulonodular opacities,
similar in severity to the most recent plain film. There has been
slight improved aeration when compared over time to the plain film
sequence of 08/31/2019, 09/01/2019, 09/02/2019.

Bilateral thoracostomy tubes are unchanged.

Right IJ ECMO cannula, terminating out of the field of view in the
abdomen.

Left subclavian central venous catheter, with the tip appearing to
terminate superior vena cava.

Tracheostomy tube unchanged.
IMPRESSION: Unchanged right IJ venous ECMO cannula, left subclavian central
venous catheter, bilateral thoracostomy tubes, and tracheostomy.

Low lung volumes persist, with persisting bilateral reticulonodular
opacities, and slight improved aeration when compared to plain film
sequence [DATE].

## 2021-11-22 IMAGING — DX DG CHEST 1V PORT
1 series · 1 of 1 positions shown · non-contrast
Comparison: 09/11/2019, 09/10/2019

CLINICAL DATA: 43-year-old male with pulmonary failure on ECMO.

EXAM:
PORTABLE CHEST 1 VIEW

[chest ap]
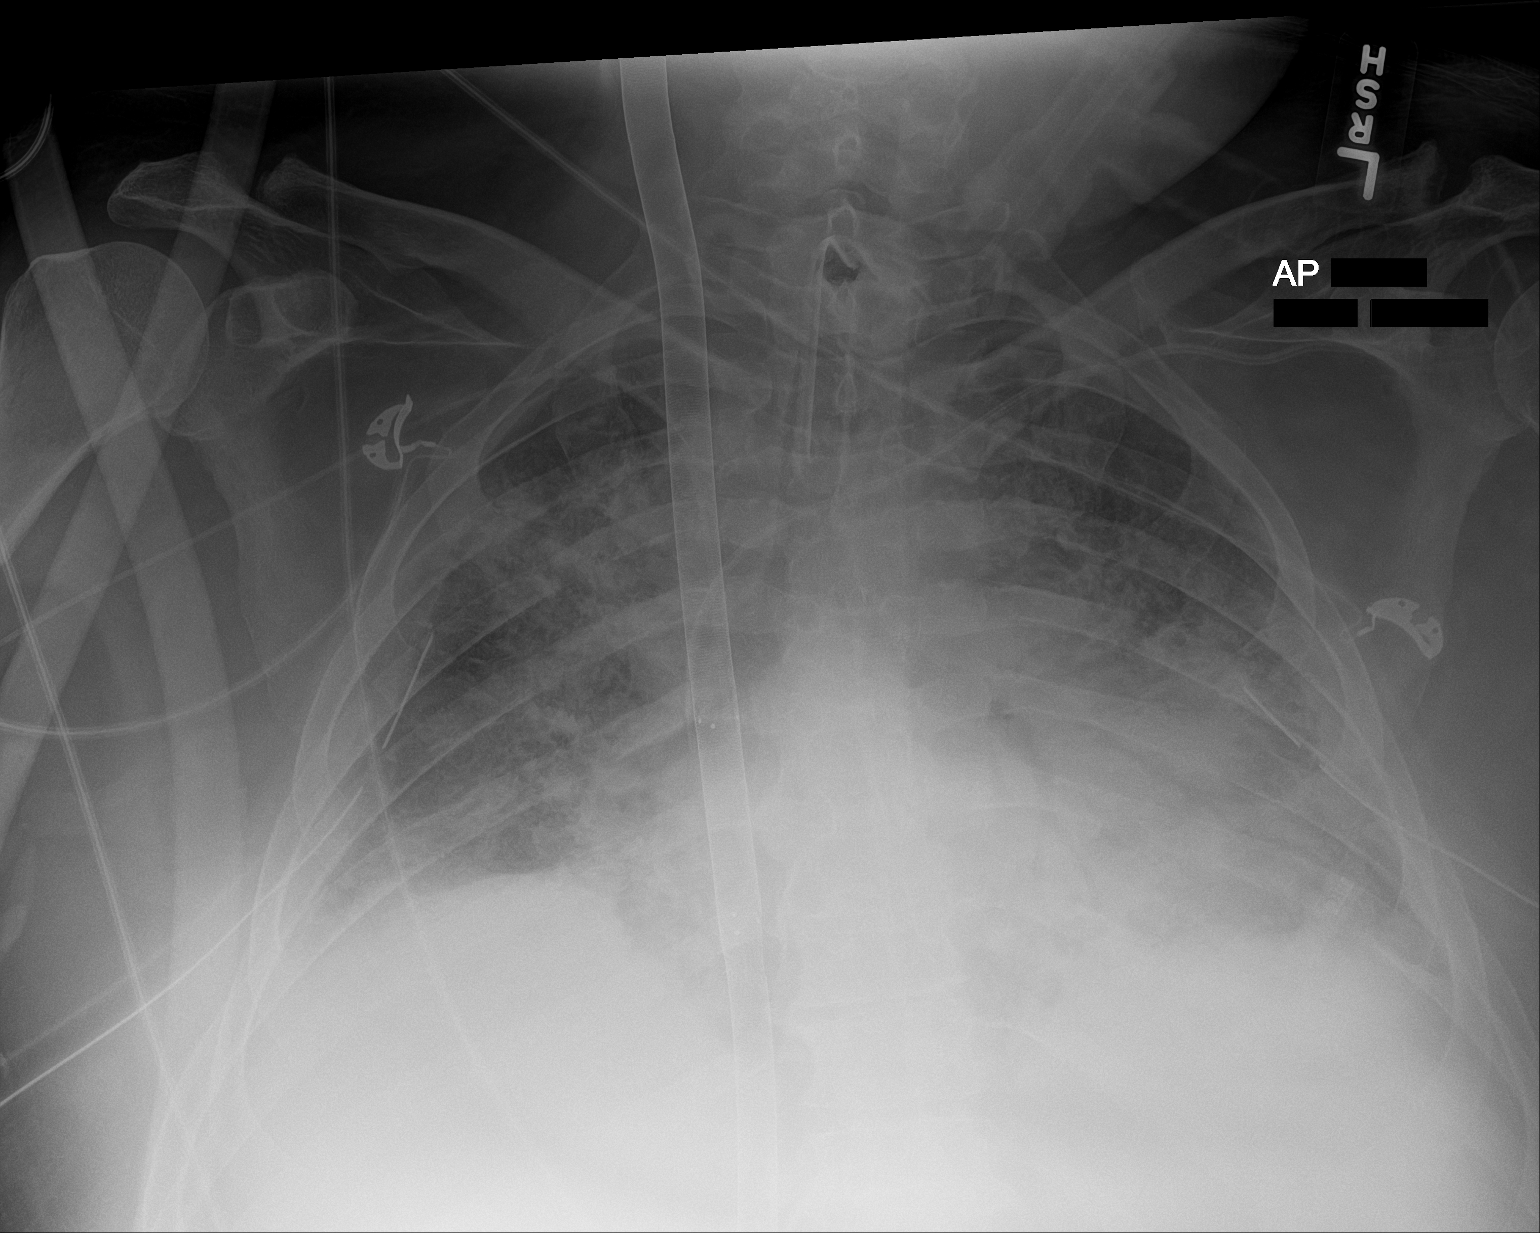

[1 of 1 positions shown; findings below may reference images not displayed]

FINDINGS: Cardiomediastinal silhouette unchanged with cardiomegaly.

Low lung volumes persist with persisting mixed interstitial and
airspace disease bilaterally. Overall aeration is similar to the
comparison chest x-ray of yesterday. Air bronchograms of the left
lung.

Bilateral thoracostomy tubes unchanged.

Unchanged right IJ ECMO cannula.

Unchanged tracheostomy. Unchanged left subclavian central venous
catheter.

No visualized pneumothorax.
IMPRESSION: Unchanged appearance of the chest x-ray, with low lung volumes and
mixed interstitial and airspace disease bilaterally.

Unchanged tracheostomy, left subclavian central venous catheter,
bilateral thoracostomy tubes, and right IJ ECMO cannula.

## 2021-11-23 DIAGNOSIS — S91309A Unspecified open wound, unspecified foot, initial encounter: Secondary | ICD-10-CM | POA: Diagnosis not present

## 2021-11-23 IMAGING — DX DG CHEST 1V PORT
1 series · 1 of 1 positions shown · non-contrast
Comparison: September 12, 2019.

CLINICAL DATA: Tracheostomy tube.

EXAM:
PORTABLE CHEST 1 VIEW

[chest ap]
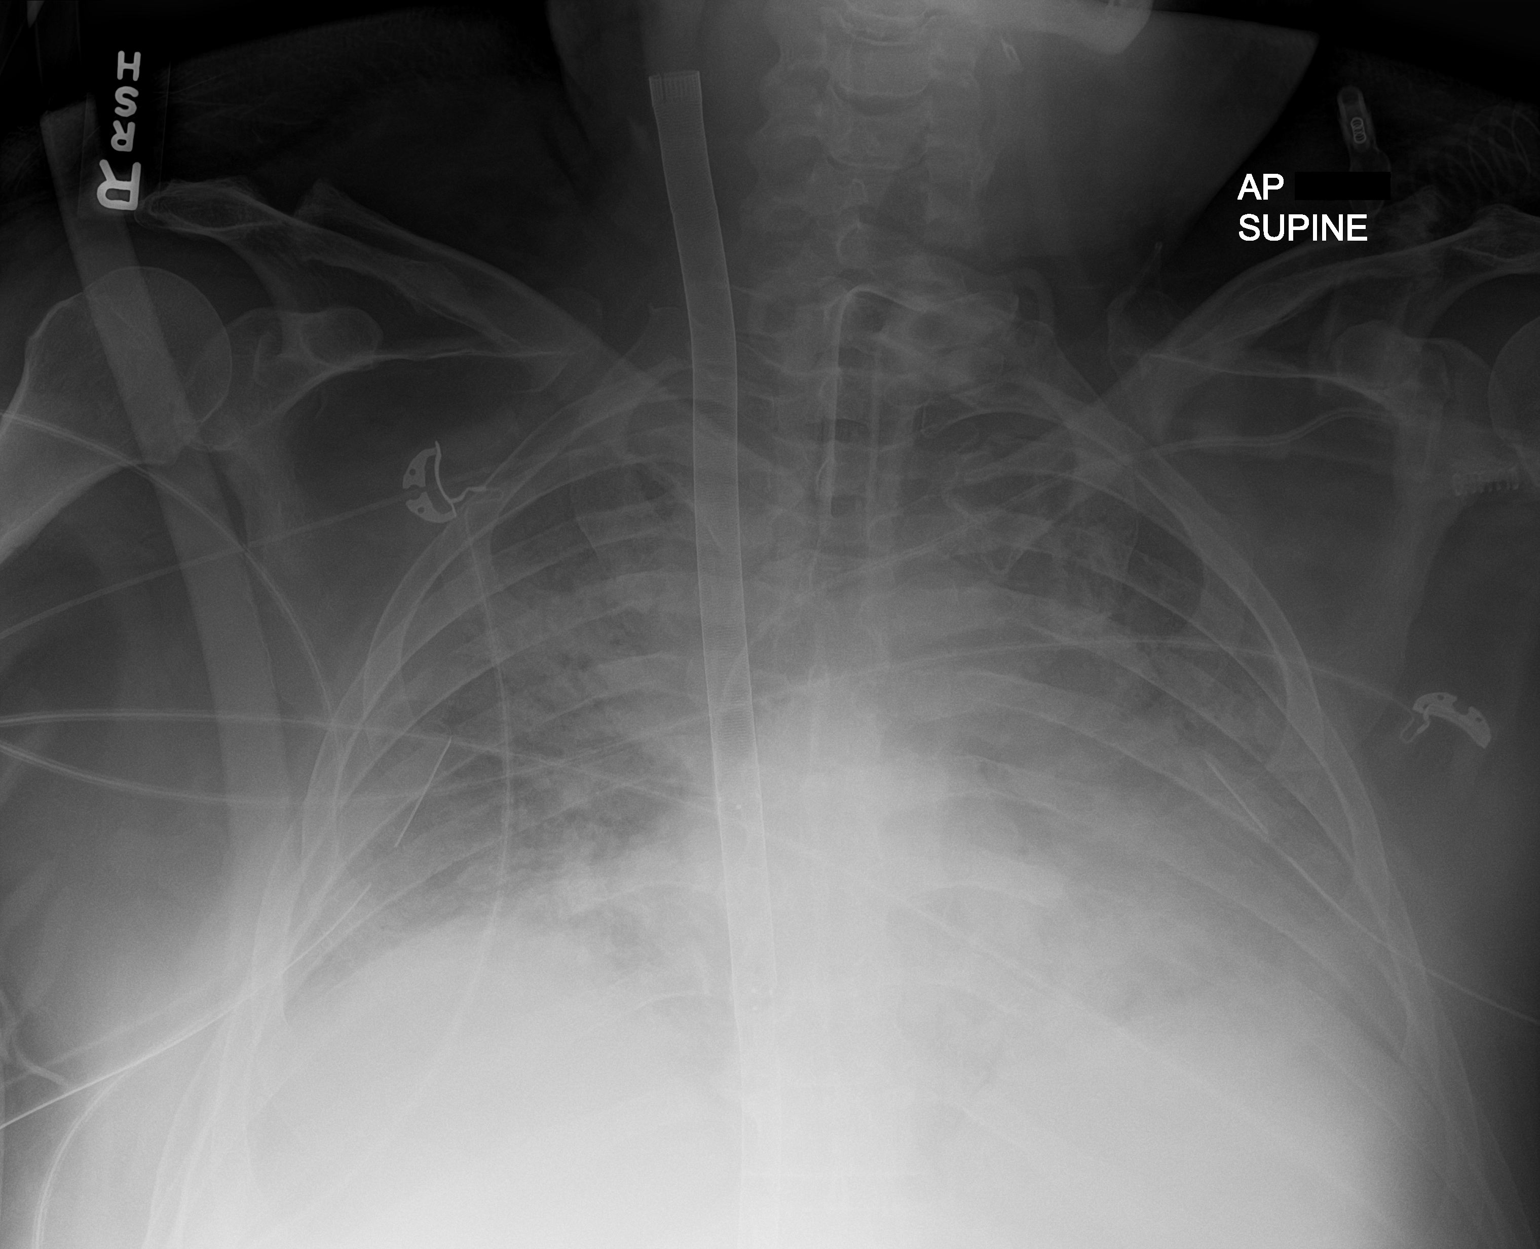

[1 of 1 positions shown; findings below may reference images not displayed]

FINDINGS: Stable cardiomediastinal silhouette. Tracheostomy tube is unchanged
in position. Stable bilateral chest tubes are noted without
pneumothorax. Stable bilateral lung opacities are noted concerning
for multifocal pneumonia or edema. No significant pleural effusion
is noted. Left subclavian catheter is unchanged in position. Bony
thorax is unremarkable.
IMPRESSION: Stable support apparatus. Stable bilateral lung opacities are noted
concerning for multifocal pneumonia or edema.

## 2021-11-24 IMAGING — DX DG CHEST 1V PORT
1 series · 1 of 1 positions shown · non-contrast
Comparison: 09/13/2019 and prior studies

CLINICAL DATA: Pulmonary failure. COVID pneumonia.

EXAM:
PORTABLE CHEST 1 VIEW

[chest ap]
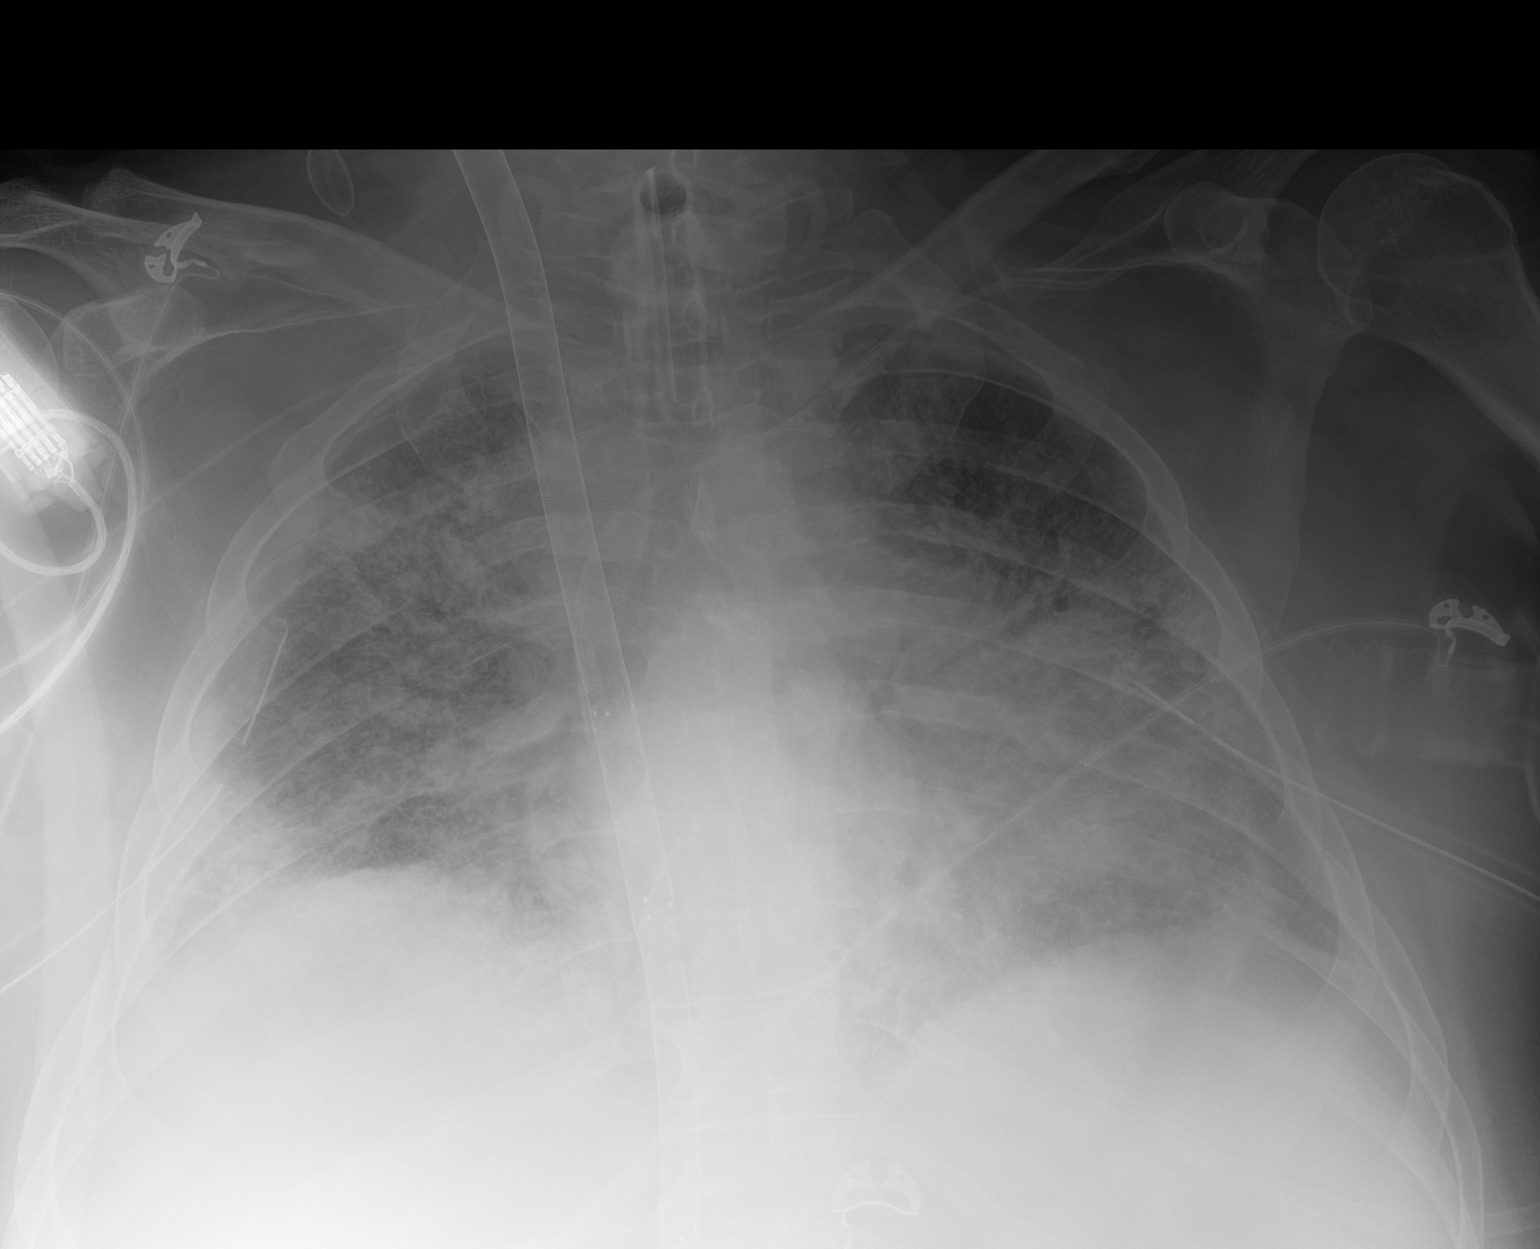

[1 of 1 positions shown; findings below may reference images not displayed]

FINDINGS: Cardiomegaly and diffuse bilateral airspace opacities again noted.

Bilateral thoracostomy tubes, tracheostomy tube and RIGHT IJ ECMO
cannula again noted.

There is no evidence of pneumothorax large pleural effusion.

There has been little interval change since the prior study.
IMPRESSION: Unchanged appearance of the chest with diffuse bilateral airspace
opacities.

## 2021-11-25 ENCOUNTER — Other Ambulatory Visit: Payer: Self-pay | Admitting: Allergy & Immunology

## 2021-11-25 IMAGING — DX DG CHEST 1V PORT
1 series · 1 of 1 positions shown · non-contrast
Comparison: Portable chest 09/14/2019 and earlier.

CLINICAL DATA: 43-year-old male with respiratory failure due to
[id number] pneumonia. ECMO.

EXAM:
PORTABLE CHEST 1 VIEW

[chest ap]
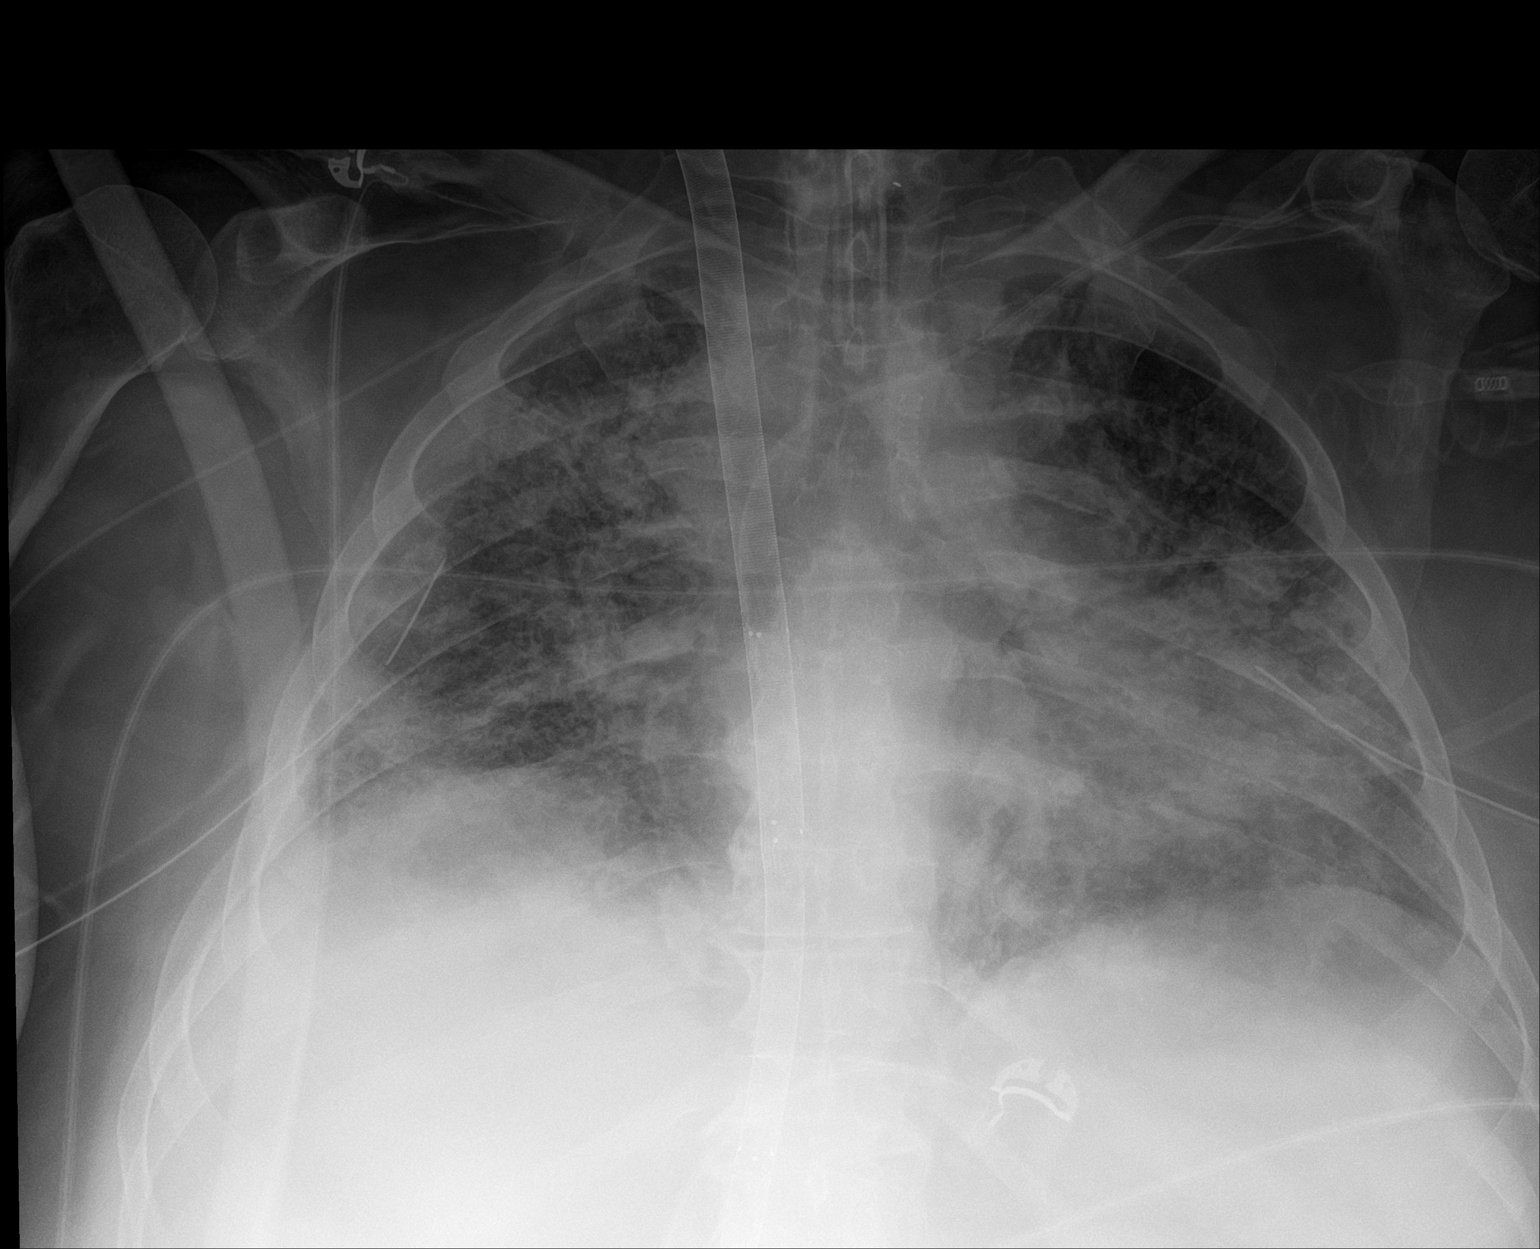

[1 of 1 positions shown; findings below may reference images not displayed]

FINDINGS: Portable AP semi upright view at 9142 hours. Stable tracheostomy.
Stable bilateral chest tubes. Large caliber vascular catheter again
projects vertically along the right chest near midline.

Stable lung volumes and ventilation with residual widespread coarse
pulmonary interstitial opacity. No pneumothorax identified. Small if
any pleural effusions. Stable cardiac size and mediastinal contours.
Paucity of bowel gas in the upper abdomen.
IMPRESSION: 1. Stable lines and tubes.
2. Stable lung volumes and ventilation with widespread residual
coarse interstitial opacity.
3. No pneumothorax identified.

## 2021-11-26 ENCOUNTER — Encounter (HOSPITAL_BASED_OUTPATIENT_CLINIC_OR_DEPARTMENT_OTHER): Payer: BC Managed Care – PPO | Admitting: General Surgery

## 2021-11-26 DIAGNOSIS — Z8616 Personal history of COVID-19: Secondary | ICD-10-CM | POA: Diagnosis not present

## 2021-11-26 DIAGNOSIS — L97512 Non-pressure chronic ulcer of other part of right foot with fat layer exposed: Secondary | ICD-10-CM | POA: Diagnosis not present

## 2021-11-26 DIAGNOSIS — I10 Essential (primary) hypertension: Secondary | ICD-10-CM | POA: Diagnosis not present

## 2021-11-26 DIAGNOSIS — L97522 Non-pressure chronic ulcer of other part of left foot with fat layer exposed: Secondary | ICD-10-CM | POA: Diagnosis not present

## 2021-11-26 IMAGING — DX DG CHEST 1V PORT
1 series · 1 of 1 positions shown · non-contrast
Comparison: Prior chest x-ray 09/15/2019

CLINICAL DATA: 43-year-old male on ECMO

EXAM:
PORTABLE CHEST 1 VIEW

[chest ap]
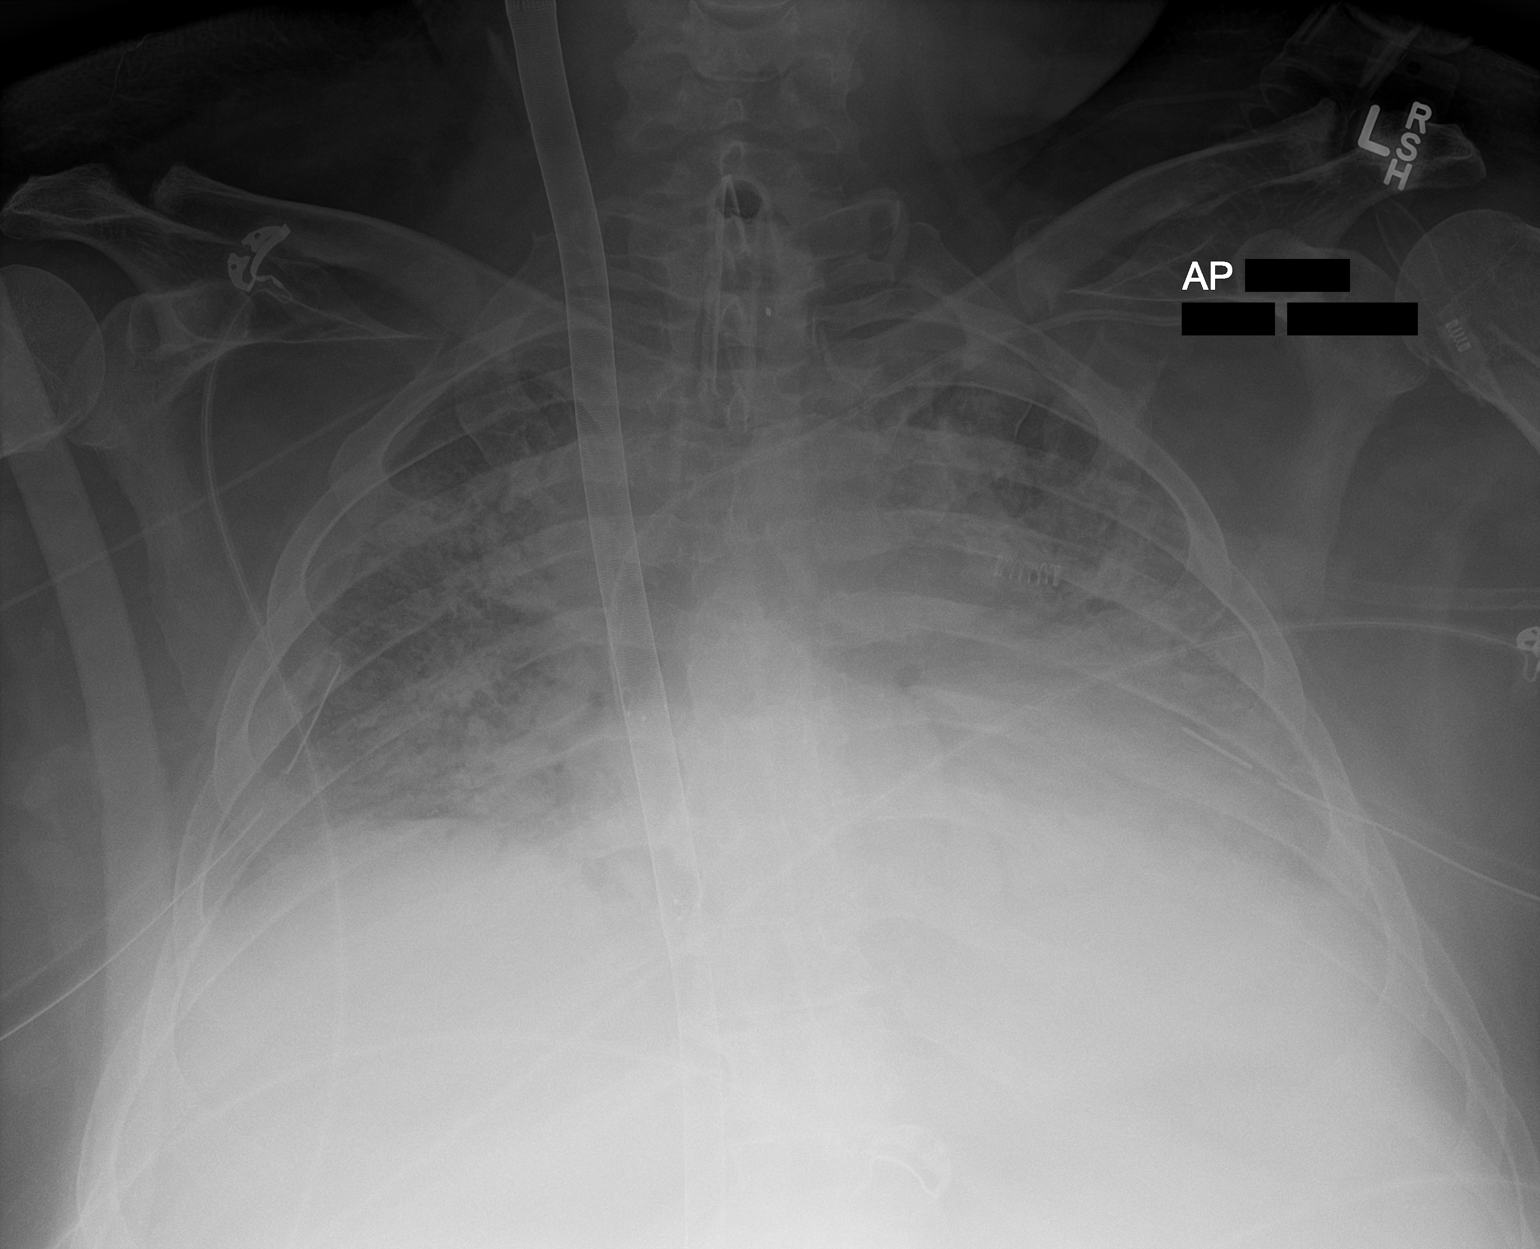

[1 of 1 positions shown; findings below may reference images not displayed]

FINDINGS: ECMO catheter enters via the right internal jugular vein and passes
through the right heart and into the inferior vena cava.
Tracheostomy tube is present. The tip remains midline and at the
level of the clavicles. Bilateral chest tubes are present and in
stable position. No evidence of pneumothorax. Overall aeration has
worsened. Inspiratory volumes are lower and there is significantly
increased airspace opacity throughout the left lung base and
increasing airspace opacity in the right perihilar and right basilar
region. Left subclavian approach central venous catheter remains in
stable position. The tip overlies the cavoatrial junction. No acute
osseous abnormality.
IMPRESSION: 1. Worsening aeration with decreased inspiratory volumes and
significantly increased airspace opacity in the left lung base.
Additionally, slightly increased airspace opacities in the right
perihilar region and right medial base.
2. Stable and satisfactory support apparatus without significant
interval change.
3. No pneumothorax.

## 2021-11-26 NOTE — Progress Notes (Signed)
TREVONN, HALLUM ([id number]) ?Visit Report for 11/26/2021 ?Chief Complaint Document Details ?Patient Name: Date of Service: ?[name], [name] 11/26/2021 9:30 A M ?Medical Record Number: [id number] ?Patient Account Number: [id number] ?Date of Birth/Sex: Treating RN: ?[birth year] (46 y.o. [name]) [name] ?Primary Care Provider: [name] Other Clinician: ?Referring Provider: ?Treating Provider/Extender: Fredirick Maudlin ?[name] ?Weeks in Treatment: 62 ?Information Obtained from: Patient ?Chief Complaint ?Bilateral Plantar Foot Ulcers ?Electronic Signature(s) ?Signed: 11/26/2021 10:28:13 AM By: Fredirick Maudlin MD FACS ?Entered By: Fredirick Maudlin on 11/26/2021 10:28:13 ?-------------------------------------------------------------------------------- ?Debridement Details ?Patient Name: Date of Service: ?[name], [name] 11/26/2021 9:30 A M ?Medical Record Number: [id number] ?Patient Account Number: [id number] ?Date of Birth/Sex: Treating RN: ?1976-06-03 (46 y.o. [name]) [name] ?Primary Care Provider: [name] Other Clinician: ?Referring Provider: ?Treating Provider/Extender: Fredirick Maudlin ?[name] ?Weeks in Treatment: 24 ?Debridement Performed for Assessment: Wound #1 Right Calcaneus ?Performed By: Physician Fredirick Maudlin, MD ?Debridement Type: Debridement ?Level of Consciousness (Pre-procedure): Awake and Alert ?Pre-procedure Verification/Time Out Yes - 10:07 ?Taken: ?Start Time: 10:07 ?Pain Control: Lidocaine 5% topical ointment ?T Area Debrided (L x W): ?otal 2.3 (cm) x 0.3 (cm) = 0.69 (cm?) ?Tissue and other material debrided: Viable, Non-Viable, Callus, Slough, Subcutaneous, Slough ?Level: Skin/Subcutaneous Tissue ?Debridement Description: Excisional ?Instrument: Curette ?Bleeding: Minimum ?Hemostasis Achieved: Pressure ?End Time: 10:08 ?Procedural Pain: 0 ?Post Procedural Pain: 0 ?Response to Treatment: Procedure was tolerated well ?Level of Consciousness (Post- Awake and  Alert ?procedure): ?Post Debridement Measurements of Total Wound ?Length: (cm) 2.3 ?Stage: Category/Stage III ?Width: (cm) 0.3 ?Depth: (cm) 0.3 ?Volume: (cm?) 0.163 ?Character of Wound/Ulcer Post Debridement: Improved ?Post Procedure Diagnosis ?Same as Pre-procedure ?Electronic Signature(s) ?Signed: 11/26/2021 10:58:40 AM By: Fredirick Maudlin MD FACS ?Signed: 11/26/2021 5:03:48 PM By: [name] ?Entered By: [name] on 11/26/2021 10:09:57 ?-------------------------------------------------------------------------------- ?HPI Details ?Patient Name: Date of Service: ?[name], [name] 11/26/2021 9:30 A M ?Medical Record Number: [id number] ?Patient Account Number: [id number] ?Date of Birth/Sex: Treating RN: ?February 19, 1976 (46 y.o. [name]) [name] ?Primary Care Provider: [name] Other Clinician: ?Referring Provider: ?Treating Provider/Extender: Fredirick Maudlin ?[name] ?Weeks in Treatment: 26 ?History of Present Illness ?HPI Description: Wounds are12/03/2020 upon evaluation today patient presents for initial inspection here in our clinic concerning issues he has been having ?with the bottoms of his feet bilaterally. He states these actually occurred as wounds when he was hospitalized for 5 months secondary to Covid. He was ?apparently with tilting bed where he was in an upright position quite frequently and apparently this occurred in some way shape or form during that time. ?Fortunately there is no sign of active infection at this time. No fevers, chills, nausea, vomiting, or diarrhea. With that being said he still has substantial wounds ?on the plantar aspects of his feet Theragen require quite a bit of work to get these to heal. He has been using Santyl currently though that is been problematic ?both in receiving the medication as well as actually paid for it as it is become quite expensive. Prior to the experience with Covid the patient really did not have ?any major medical problems other than  hypertension he does have some mild generalized weakness following the Covid experience. ?07/22/2020 on evaluation today patient appears to be doing okay in regard to his foot ulcers I feel like the wound beds are showing signs of better ?improvement that I do believe the Iodoflex is helping in this regard. With that being said he does have a lot of drainage  currently and this is somewhat ?blue/green in nature which is consistent with Pseudomonas. I do think a culture today would be appropriate for [place] to evaluate and see if that is indeed the case ?I would likely start him on antibiotic orally as well he is not allergic to Cipro knows of no issues he has had in the past ?12/21; patient was admitted to the clinic earlier this month with bilateral presumed pressure ulcers on the bottom of his feet apparently related to excessive ?pressure from a tilt table arrangement in the intensive care unit. Patient relates this to being on ECMO but I am not really sure that is exactly related to that. I ?must say I have never seen anything like this. He has fairly extensive full-thickness wounds extending from his heel towards his midfoot mostly centered ?laterally. There is already been some healing distally. He does not appear to have an arterial issue. He has been using gentamicin to the wound surfaces with ?Iodoflex to help with ongoing debridement ?1/6; this is a patient with pressure ulcers on the bottom of his feet related to excessive pressure from a standing position in the intensive care unit. He is ?complaining of a lot of pain in the right heel. He is not a diabetic. 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 We have been using ?Iodoflex to help prepare the surfaces of both wounds for an advanced treatment product. He is nonambulatory spending most of his time in a wheelchair I ?have asked him not to propel the wheelchair with his heels ?1/13; in general his wounds look better not much surface  area change we have been using Iodoflex as of last week. ?I did an x-ray of the right heel as the patient was complaining of pain. I had some thoughts about a stress fracture perhaps Achilles tendon problems however ?what it showed was erosive changes along the inferior aspect of the calcaneus he now has a MRI booked for 1/20. ?1/20; in general his wounds continue to be better. Some improvement in the large narrow areas proximally in his foot. He is still complaining of pain in the right ?heel and tenderness in certain areas of this wound. His MRI is tonight. I am not just looking for osteomyelitis that was brought up on the x-ray I am wondering ?about stress fractures, tendon ruptures etc. He has no such findings on the left. Also noteworthy is that the patient had critical illness neuropathy and some of ?the discomfort may be actual improvement in nerve function I am just not sure. ?These wounds were initially in the setting of severe critical illness related to COVID-19. He was put in a standing position. He may have also been on pressors ?at the point contributing to tissue ischemia. By his description at some point these wounds were grossly necrotic extending proximally up into the Achilles part ?of his heel. I do not know that I have ever really seen pictures of them like this although they may exist in epic ?We have ordered Tri layer Oasis. I am trying to stimulate some granulation in these areas. This is of course assuming the MRI is negative for infection ?1/27; since the patient was last here he saw Dr. Juleen China of infectious disease. He is planned for vancomycin and ceftriaxone. Prior operative culture grew ?MSSA. Also ordered baseline lab work. ?He also ordered arterial studies although the ABIs in our clinic were normal as well as his clinical exam these were normal I do not think  he needs to see ?vascular surgery. His ABIs at the [facility] were 1.22 in the right triphasic waveforms with a normal TBI of 1.15  on the left ABI of 1.22 with triphasic waveforms ?and a normal TBI of 1.08. ?Finally he saw Dr. Amalia Hailey who will follow him in for 2 months. At this point I do not think he felt that he needed a procedure on the right calcaneal

## 2021-11-27 IMAGING — DX DG CHEST 1V PORT
1 series · 1 of 1 positions shown · non-contrast
Comparison: September 16, 2019

CLINICAL DATA: Respiratory failure

EXAM:
PORTABLE CHEST 1 VIEW

[chest]
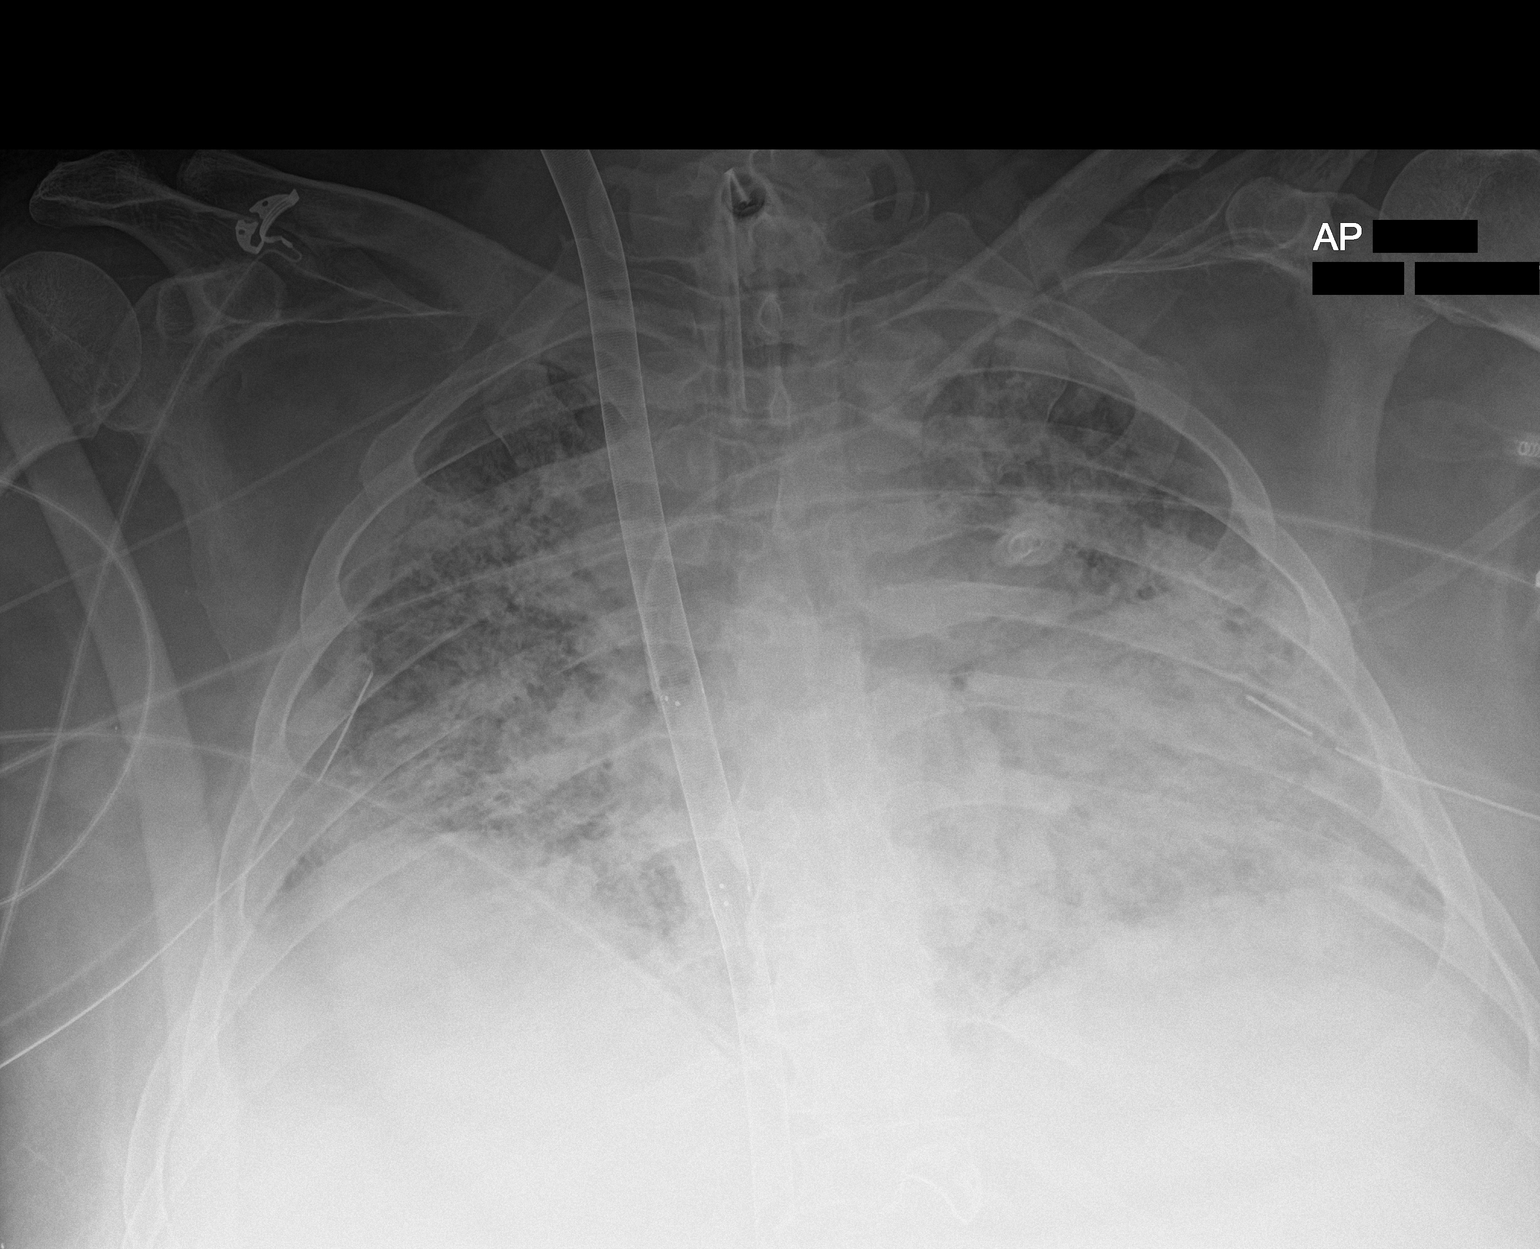

[1 of 1 positions shown; findings below may reference images not displayed]

FINDINGS: Tracheostomy catheter tip is 4.8 cm above the carina. There are
chest tubes on each side, unchanged in position. There is and
extracorporeal membrane oxygenation catheter extending from the
right jugular vein to the level of the inferior vena cava. There is
a central catheter with tip in superior vena cava. No evident
pneumothorax. There is widespread airspace opacity bilaterally,
likely primarily due to edema. There is cardiomegaly with pulmonary
venous hypertension. No adenopathy. No bone lesions.
IMPRESSION: Tube and catheter positions as described without evident
pneumothorax. Cardiomegaly with pulmonary vascular congestion noted.
Widespread airspace opacity, likely edema. Superimposed pneumonia
cannot be excluded. Appearance similar to 1 day prior.

## 2021-11-28 IMAGING — DX DG CHEST 1V PORT
1 series · 1 of 1 positions shown · non-contrast
Comparison: 09/17/2019

CLINICAL DATA: Patient on ECMO.

EXAM:
PORTABLE CHEST 1 VIEW

[chest ap]
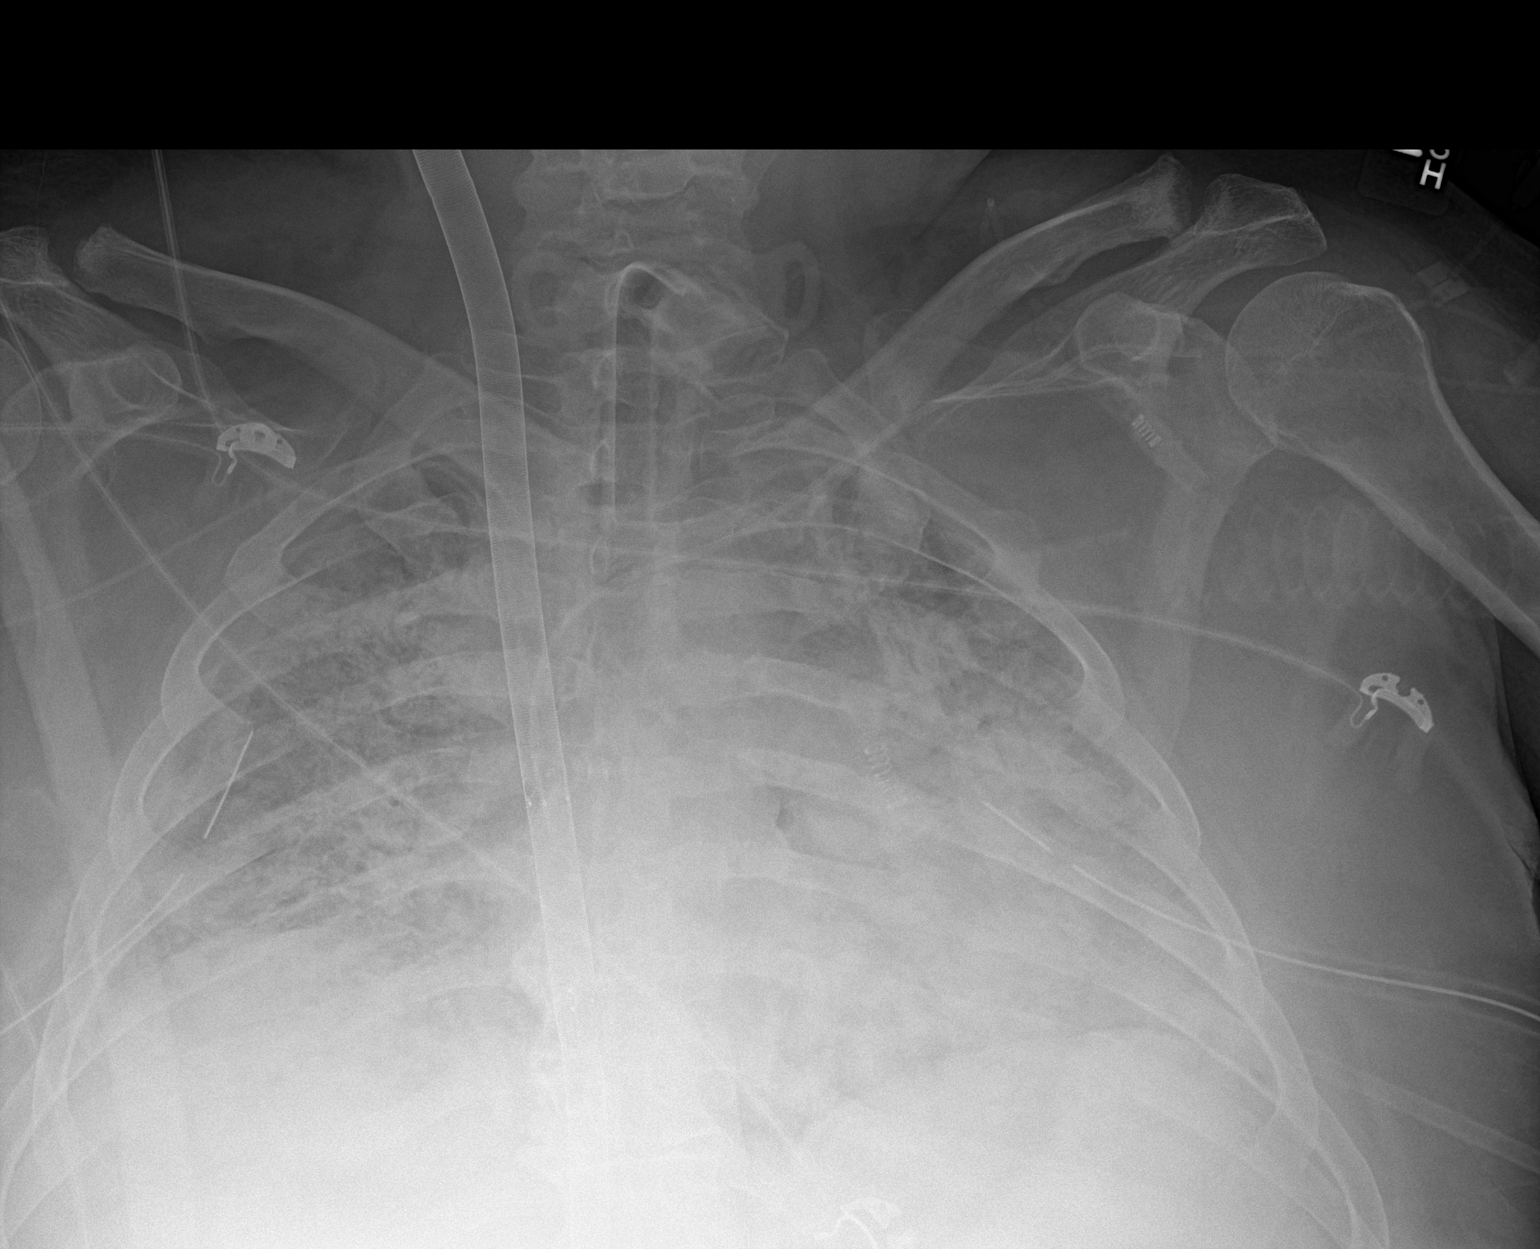

[1 of 1 positions shown; findings below may reference images not displayed]

FINDINGS: ECMO catheter unchanged. Tracheostomy tube unchanged. Left
subclavian central venous catheter and bilateral chest tubes
unchanged. Right-sided PICC line unchanged.

Lungs are hypoinflated with stable bilateral airspace opacification.
No definite effusion. Cardiomediastinal silhouette and remainder of
the exam is unchanged.
IMPRESSION: 1.  Hypoinflation with stable bilateral airspace consolidation.

2.  Multiple tubes and lines as described unchanged.

## 2021-11-29 IMAGING — DX DG CHEST 1V PORT
1 series · 1 of 1 positions shown · non-contrast
Comparison: 09/18/2019

CLINICAL DATA: Follow-up exam.

EXAM:
PORTABLE CHEST 1 VIEW

[chest ap]
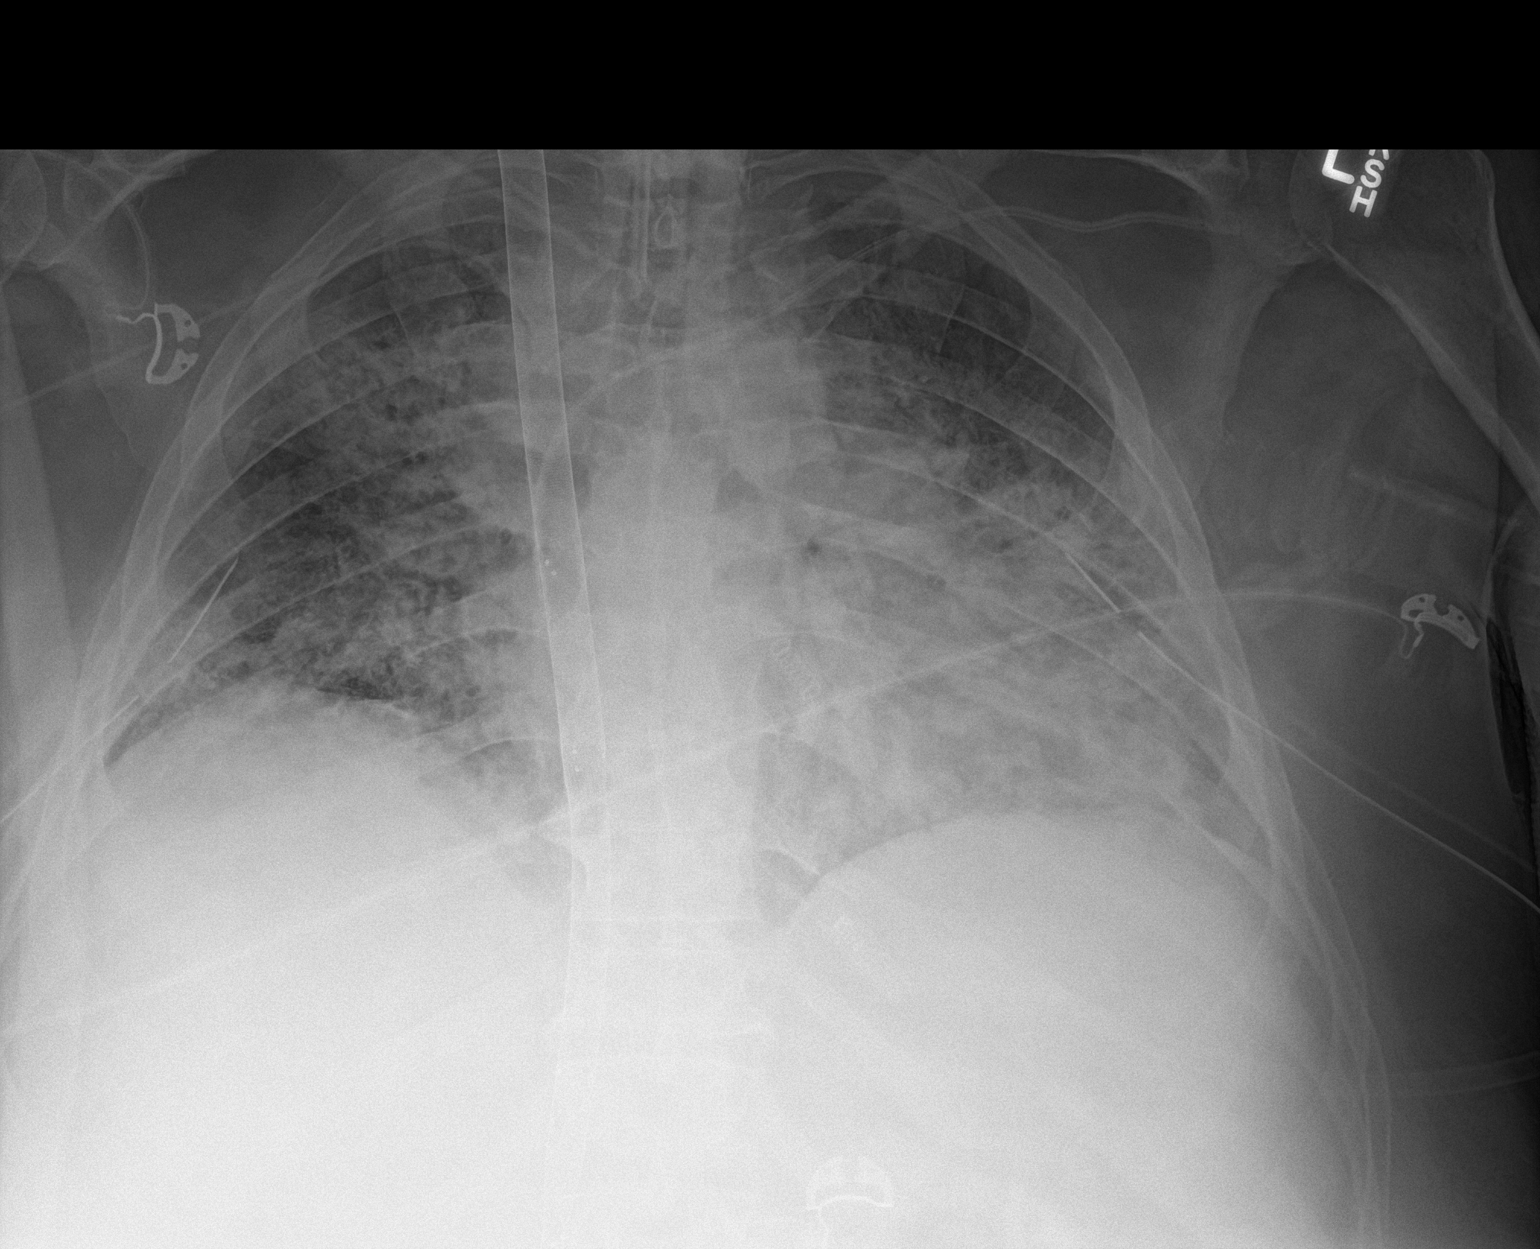

[1 of 1 positions shown; findings below may reference images not displayed]

FINDINGS: Bilateral interstitial and airspace lung opacities are similar to
the previous day's study allowing for differences in patient
positioning and technique.

No new lung abnormalities.  No pneumothorax.

Bilateral chest tubes, tracheostomy tube, right PICC and left
subclavian central venous line are stable.
IMPRESSION: 1. No significant change from the previous day's exam.
2. Persistent bilateral interstitial and airspace lung opacities.
3. No pneumothorax.
4. Stable chest tubes and other support apparatus.

## 2021-11-29 NOTE — Progress Notes (Signed)
JABREE, REBERT ([id number]) ?Visit Report for 11/26/2021 ?Arrival Information Details ?Patient Name: Date of Service: ?[name], [name] [place] E. 11/26/2021 9:30 A M ?Medical Record Number: [id number] ?Patient Account Number: [id number] ?Date of Birth/Sex: Treating RN: ?1975/10/03 (46 y.o. [name]) [name] ?Primary Care Eurika Sandy: [name] Other Clinician: ?Referring Bryanda Mikel: ?Treating Paizley Ramella/Extender: Fredirick Maudlin ?[name] ?Weeks in Treatment: 86 ?Visit Information History Since Last Visit ?Added or deleted any medications: No ?Patient Arrived: Wheel Chair ?Any new allergies or adverse reactions: No ?Arrival Time: 09:16 ?Had a fall or experienced change in No ?Accompanied By: spouse ?activities of daily living that may affect ?Transfer Assistance: Manual ?risk of falls: ?Patient Identification Verified: Yes ?Signs or symptoms of abuse/neglect since last visito No ?Patient Requires Transmission-Based Precautions: No ?Hospitalized since last visit: No ?Patient Has Alerts: No ?Implantable device outside of the clinic excluding No ?cellular tissue based products placed in the center ?since last visit: ?Has Dressing in Place as Prescribed: Yes ?Has Compression in Place as Prescribed: Yes ?Pain Present Now: Yes ?Electronic Signature(s) ?Signed: 11/26/2021 5:03:48 PM By: [name] ?Entered By: [name] on 11/26/2021 09:16:49 ?-------------------------------------------------------------------------------- ?Encounter Discharge Information Details ?Patient Name: Date of Service: ?[name], [name] [place] E. 11/26/2021 9:30 A M ?Medical Record Number: [id number] ?Patient Account Number: [id number] ?Date of Birth/Sex: Treating RN: ?Apr 24, 1976 (46 y.o. [name]) [name] ?Primary Care Perris Conwell: [name] Other Clinician: ?Referring Zyair Rhein: ?Treating Amylia Collazos/Extender: Fredirick Maudlin ?[name] ?Weeks in Treatment: 41 ?Encounter Discharge Information Items Post Procedure Vitals ?Discharge Condition:  Stable ?Temperature (F): 98.6 ?Ambulatory Status: Wheelchair ?Pulse (bpm): 89 ?Discharge Destination: Home ?Respiratory Rate (breaths/min): 16 ?Transportation: Private Auto ?Blood Pressure (mmHg): 150/91 ?Accompanied By: mother ?Schedule Follow-up Appointment: Yes ?Clinical Summary of Care: Patient Declined ?Electronic Signature(s) ?Signed: 11/26/2021 5:03:48 PM By: [name] ?Entered By: [name] on 11/26/2021 13:13:45 ?-------------------------------------------------------------------------------- ?Lower Extremity Assessment Details ?Patient Name: ?Date of Service: ?[name], [name] [place] E. 11/26/2021 9:30 A M ?Medical Record Number: [id number] ?Patient Account Number: [id number] ?Date of Birth/Sex: ?Treating RN: ?[birth year] (46 y.o. [name]) [name] ?Primary Care Jonnie Kubly: [name] ?Other Clinician: ?Referring Holton Sidman: ?Treating Jasmyne Lodato/Extender: Fredirick Maudlin ?[name] ?Weeks in Treatment: 52 ?Edema Assessment ?Assessed: [Left: No] [Right: No] ?Edema: [Left: No] [Right: No] ?Calf ?Left: Right: ?Point of Measurement: 29 cm From Medial Instep 41 cm 44.1 cm ?Ankle ?Left: Right: ?Point of Measurement: 9 cm From Medial Instep 26 cm 31.3 cm ?Electronic Signature(s) ?Signed: 11/26/2021 5:03:48 PM By: [name] ?Entered By: [name] on 11/26/2021 10:00:54 ?-------------------------------------------------------------------------------- ?Multi Wound Chart Details ?Patient Name: ?Date of Service: ?[name], [name] [place] E. 11/26/2021 9:30 A M ?Medical Record Number: [id number] ?Patient Account Number: [id number] ?Date of Birth/Sex: ?Treating RN: ?10-25-1975 (46 y.o. [name]) [name] ?Primary Care Amijah Timothy: [name] ?Other Clinician: ?Referring Malaisha Silliman: ?Treating Maythe Deramo/Extender: Fredirick Maudlin ?[name] ?Weeks in Treatment: 39 ?Vital Signs ?Height(in): 69 ?Pulse(bpm): 89 ?Weight(lbs): 280 ?Blood Pressure(mmHg): 150/91 ?Body Mass Index(BMI): 41.3 ?Temperature(??F): 98.6 ?Respiratory  Rate(breaths/min): 18 ?Photos: [N/A:N/A] ?Right Calcaneus Left Calcaneus N/A ?Wound Location: ?Pressure Injury Pressure Injury N/A ?Wounding Event: ?Pressure Ulcer Pressure Ulcer N/A ?Primary Etiology: ?Asthma, Angina, Hypertension Asthma, Angina, Hypertension N/A ?Comorbid History: ?10/07/2019 10/07/2019 N/A ?Date Acquired: ?[age] N/A ?Weeks of Treatment: ?Open Open N/A ?Wound Status: ?No No N/A ?Wound Recurrence: ?2.3x0.3x0.3 2.3x1x0.1 N/A ?Measurements L x W x D (cm) ?0.542 1.806 N/A ?A (cm?) : ?rea ?0.163 0.181 N/A ?Volume (cm?) : ?98.30% 93.30% N/A ?% Reduction in A rea: ?99.50% 99.30% N/A ?% Reduction in Volume: ?  Category/Stage III Category/Stage III N/A ?Classification: ?Medium Medium N/A ?Exudate A mount: ?Serosanguineous Serosanguineous N/A ?Exudate Type: ?red, brown red, brown N/A ?Exudate Color: ?Distinct, outline attached Distinct, outline attached N/A ?Wound Margin: ?Small (1-33%) Medium (34-66%) N/A ?Granulation A mount: ?Pink Red N/A ?Granulation Quality: ?Large (67-100%) Medium (34-66%) N/A ?Necrotic A mount: ?Eschar, Adherent [place] N/A ?Necrotic Tissue: ?Fat Layer (Subcutaneous Tissue): Yes Fat Layer (Subcutaneous Tissue): Yes N/A ?Exposed Structures: ?Fascia: No ?Fascia: No ?Tendon: No ?Tendon: No ?Muscle: No ?Muscle: No ?Joint: No ?Joint: No ?Bone: No ?Bone: No ?None Small (1-33%) N/A ?Epithelialization: ?Debridement - Excisional N/A N/A ?Debridement: ?Pre-procedure Verification/Time Out 10:07 N/A N/A ?Taken: ?Lidocaine 5% topical ointment N/A N/A ?Pain Control: ?Callus, Subcutaneous, Slough N/A N/A ?Tissue Debrided: ?Skin/Subcutaneous Tissue N/A N/A ?Level: ?0.69 N/A N/A ?Debridement A (sq cm): ?rea ?Curette N/A N/A ?Instrument: ?Minimum N/A N/A ?Bleeding: ?Pressure N/A N/A ?Hemostasis A chieved: ?0 N/A N/A ?Procedural Pain: ?0 N/A N/A ?Post Procedural Pain: ?Procedure was tolerated well N/A N/A ?Debridement Treatment Response: ?2.3x0.3x0.3 N/A N/A ?Post Debridement  Measurements L x ?W x D (cm) ?0.163 N/A N/A ?Post Debridement Volume: (cm?) ?Category/Stage III N/A N/A ?Post Debridement Stage: ?Debridement T Contact Cast ?otal N/A ?Procedures Performed: ?Treatment Notes ?Electronic Signature(s) ?Signed: 11/26/2021 10:28:02 AM By: Fredirick Maudlin MD FACS ?Signed: 11/26/2021 5:03:48 PM By: [name] ?Entered By: Fredirick Maudlin on 11/26/2021 10:28:02 ?-------------------------------------------------------------------------------- ?Multi-Disciplinary Care Plan Details ?Patient Name: ?Date of Service: ?[name], [name] [place] E. 11/26/2021 9:30 A M ?Medical Record Number: [id number] ?Patient Account Number: [id number] ?Date of Birth/Sex: ?Treating RN: ?1976/08/01 (46 y.o. [name]) [name] ?Primary Care Ranvir Renovato: [name] ?Other Clinician: ?Referring Jahziel Sinn: ?Treating Dionne Knoop/Extender: Fredirick Maudlin ?[name] ?Weeks in Treatment: 55 ?Multidisciplinary Care Plan reviewed with physician ?Active Inactive ?Wound/Skin Impairment ?Nursing Diagnoses: ?Impaired tissue integrity ?Knowledge deficit related [name] ulceration/compromised skin integrity ?Goals: ?Patient/caregiver will verbalize understanding of skin care regimen ?Date Initiated: 07/15/2020 ?Target Resolution Date: 12/03/2021 ?Goal Status: Active ?Ulcer/skin breakdown will have a volume reduction of 30% by week 4 ?Date Initiated: 07/15/2020 ?Date Inactivated: 08/20/2020 ?Target Resolution Date: 09/03/2020 ?Goal Status: Unmet ?Unmet Reason: no major changes. ?Ulcer/skin breakdown will heal within 14 weeks ?Date Initiated: 12/04/2020 ?Date Inactivated: 12/10/2020 ?Target Resolution Date: 12/10/2020 ?Unmet Reason: wounds still open at 14 ?Goal Status: Unmet ?weeks and today 21 weeks. ?Interventions: ?Assess patient/caregiver ability [name] obtain necessary supplies ?Assess patient/caregiver ability [name] perform ulcer/skin care regimen upon admission and as needed ?Assess ulceration(s) every visit ?Provide education on ulcer and skin  care ?Treatment Activities: ?Skin care regimen initiated : 07/15/2020 ?Topical wound management initiated : 07/15/2020 ?Notes: ?Electronic Signature(s) ?Signed: 11/26/2021 5:03:48 PM By: [name] ?Entered By: [name] on 0

## 2021-11-30 IMAGING — DX DG CHEST 1V PORT
1 series · 1 of 1 positions shown · non-contrast
Comparison: 09/19/2019

CLINICAL DATA: Check tracheostomy tube placement

EXAM:
PORTABLE CHEST 1 VIEW

[chest ap]
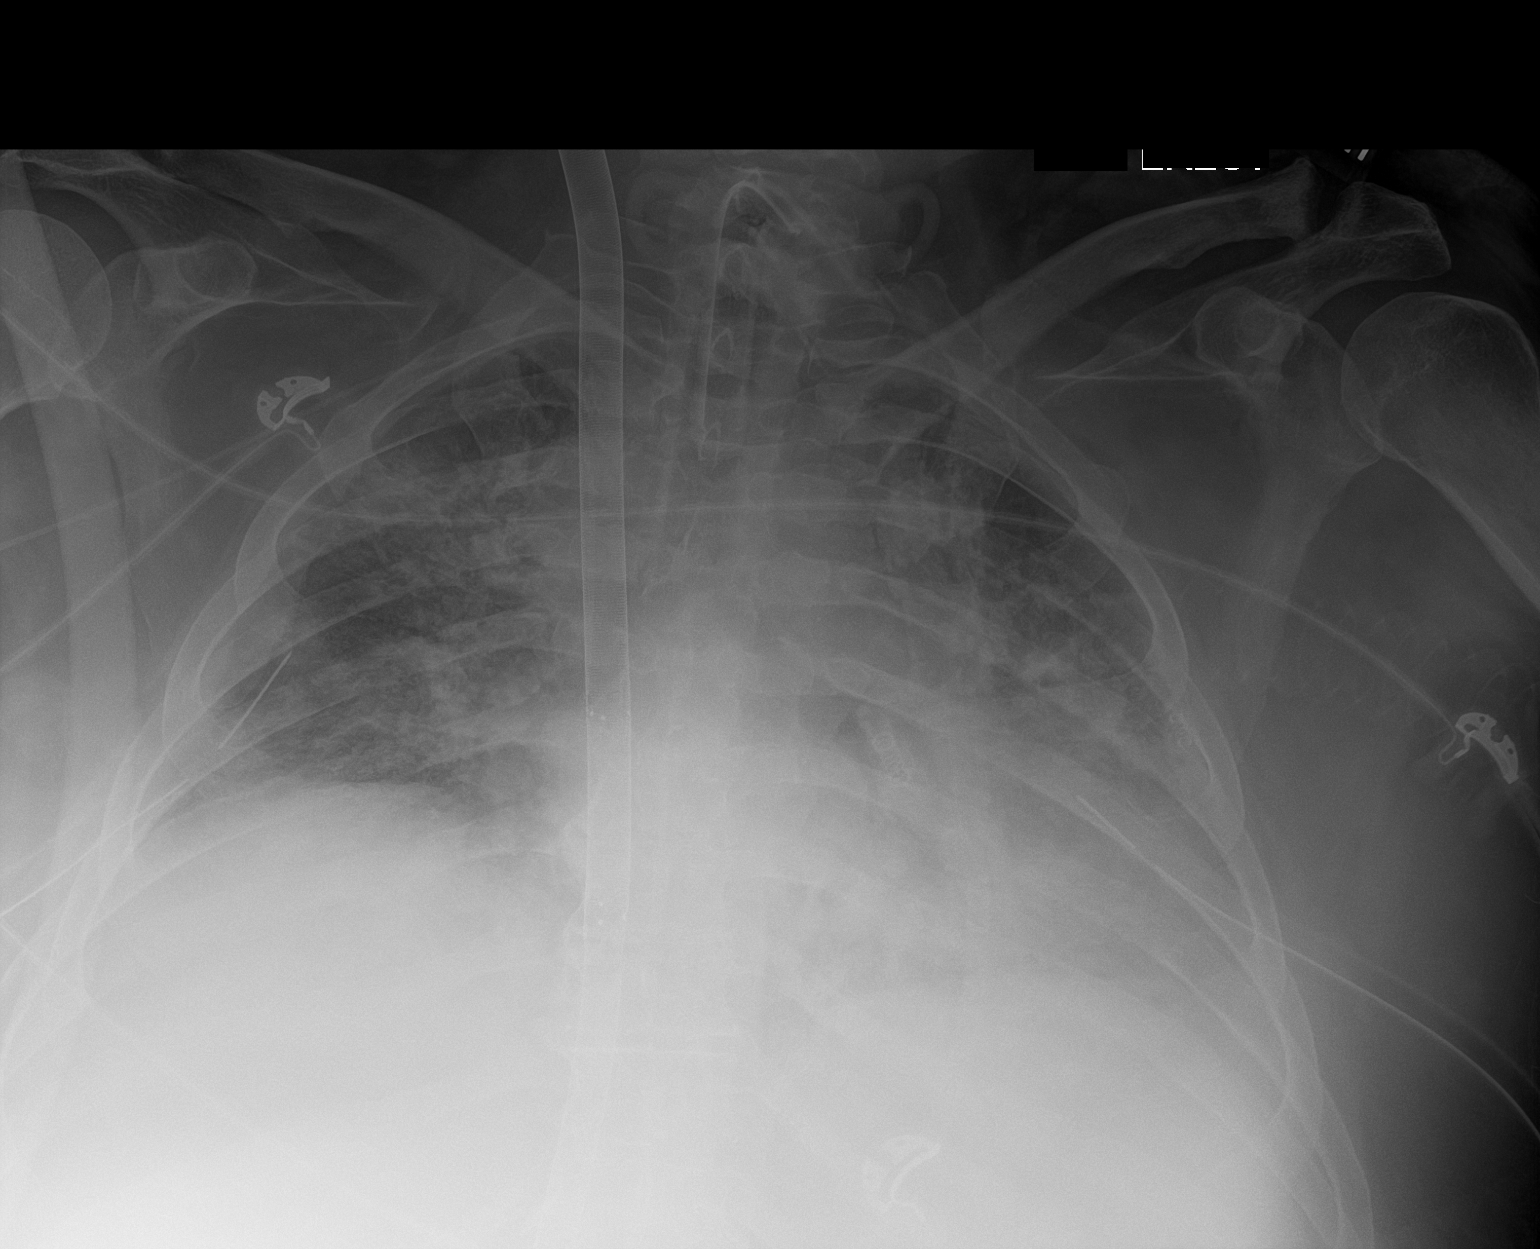

[1 of 1 positions shown; findings below may reference images not displayed]

FINDINGS: Cardiac shadow remains enlarged. ECMO cannula is again identified
and stable. Tracheostomy tube and bilateral thoracostomy catheters
are again noted and stable. The overall inspiratory effort is poor.
No pneumothorax is seen. Diffuse patchy infiltrates are noted
bilaterally relatively stable from the prior exam. No new focal
abnormality is noted.
IMPRESSION: Tubes and lines as described.

Bilateral opacities relatively stable from the prior study.

## 2021-12-01 IMAGING — DX DG CHEST 1V PORT
1 series · 1 of 1 positions shown · non-contrast
Comparison: Chest x-rays dated 09/20/2019 in 09/19/2019.

CLINICAL DATA: COVID positive. Encounter for ECMO

EXAM:
PORTABLE CHEST 1 VIEW

[chest]
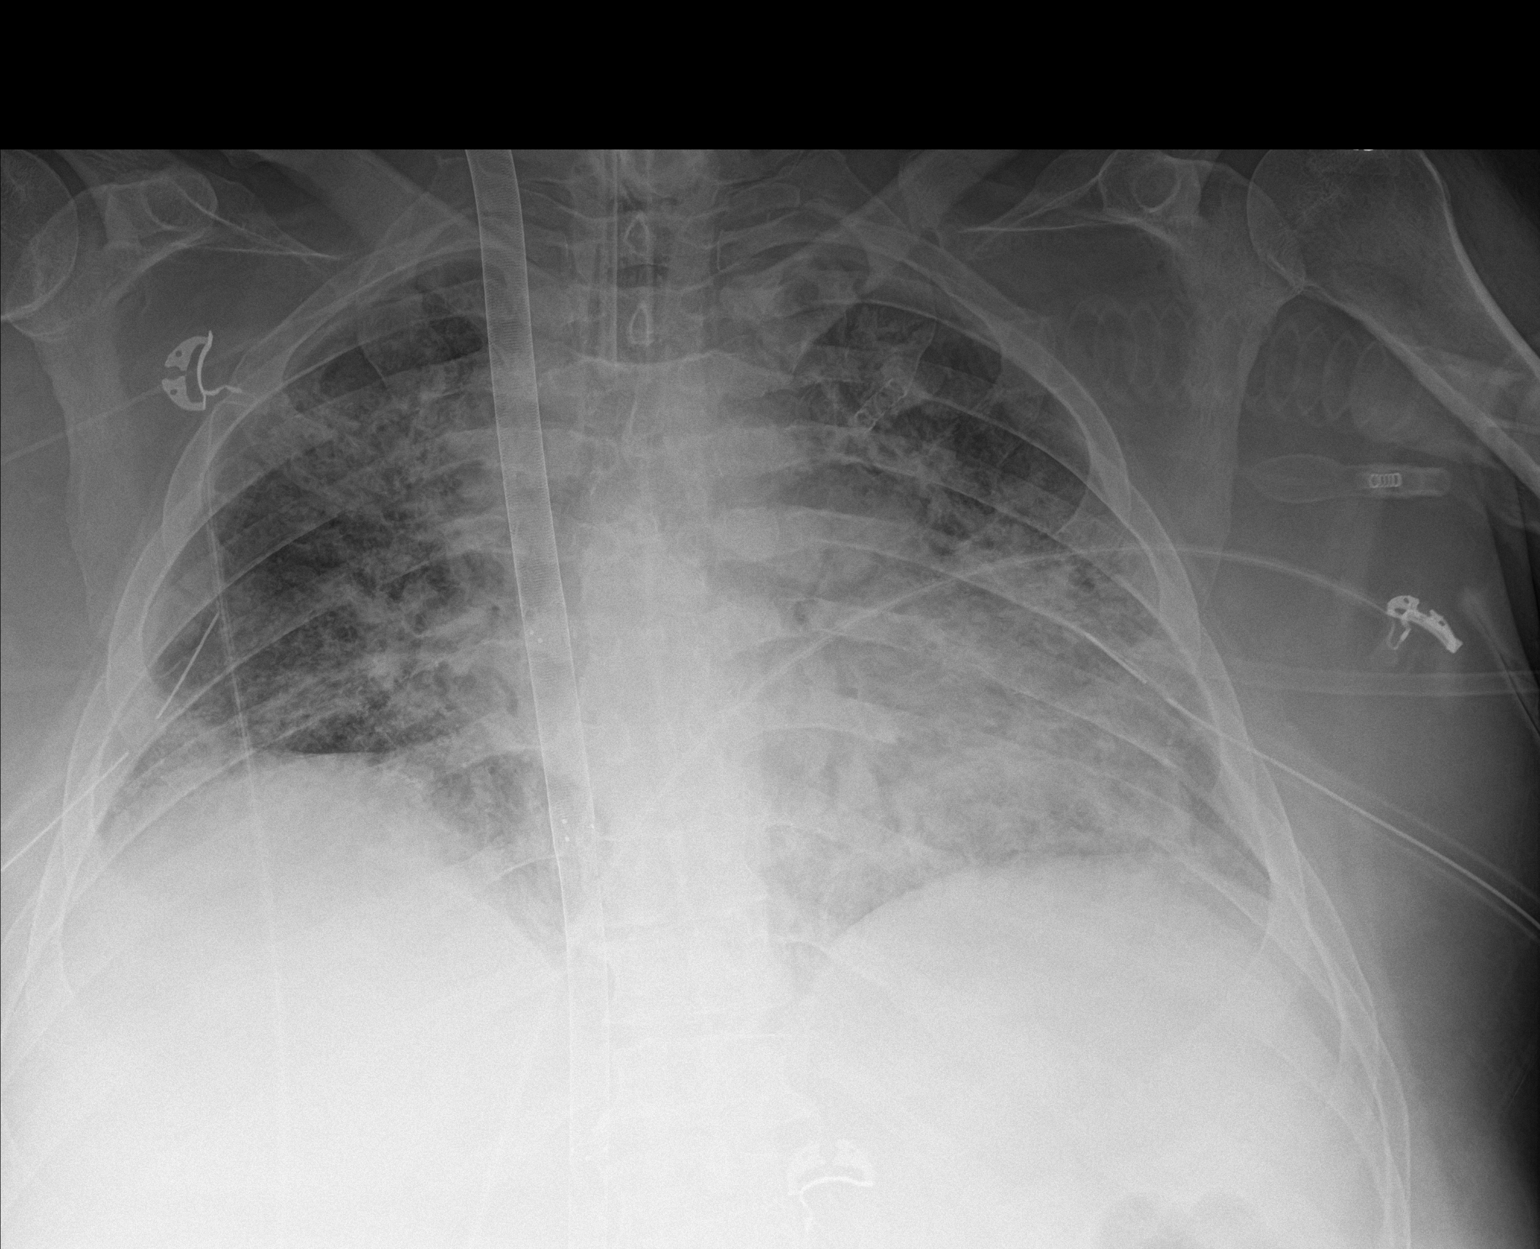

[1 of 1 positions shown; findings below may reference images not displayed]

FINDINGS: Stable cardiomegaly. ECMO cannula again seen over the RIGHT
hemithorax. Tracheostomy tube remains appropriately positioned in
the midline. Bilateral chest tubes are stable in position.

Bilateral patchy interstitial and alveolar airspace opacities appear
stable. No new lung findings. No pleural effusion or pneumothorax is
seen.
IMPRESSION: 1. Stable chest x-ray. Bilateral interstitial and alveolar airspace
opacities are unchanged, compatible with multifocal pneumonia versus
pulmonary edema.
2. Stable cardiomegaly.

## 2021-12-02 ENCOUNTER — Encounter (HOSPITAL_BASED_OUTPATIENT_CLINIC_OR_DEPARTMENT_OTHER): Payer: BC Managed Care – PPO | Admitting: General Surgery

## 2021-12-02 DIAGNOSIS — L97512 Non-pressure chronic ulcer of other part of right foot with fat layer exposed: Secondary | ICD-10-CM | POA: Diagnosis not present

## 2021-12-02 DIAGNOSIS — I10 Essential (primary) hypertension: Secondary | ICD-10-CM | POA: Diagnosis not present

## 2021-12-02 DIAGNOSIS — L97522 Non-pressure chronic ulcer of other part of left foot with fat layer exposed: Secondary | ICD-10-CM | POA: Diagnosis not present

## 2021-12-02 DIAGNOSIS — Z8616 Personal history of COVID-19: Secondary | ICD-10-CM | POA: Diagnosis not present

## 2021-12-02 IMAGING — DX DG CHEST 1V PORT
1 series · 1 of 1 positions shown · non-contrast
Comparison: 09/21/2019

CLINICAL DATA: Currently on ECMO

EXAM:
PORTABLE CHEST 1 VIEW

[chest ap]
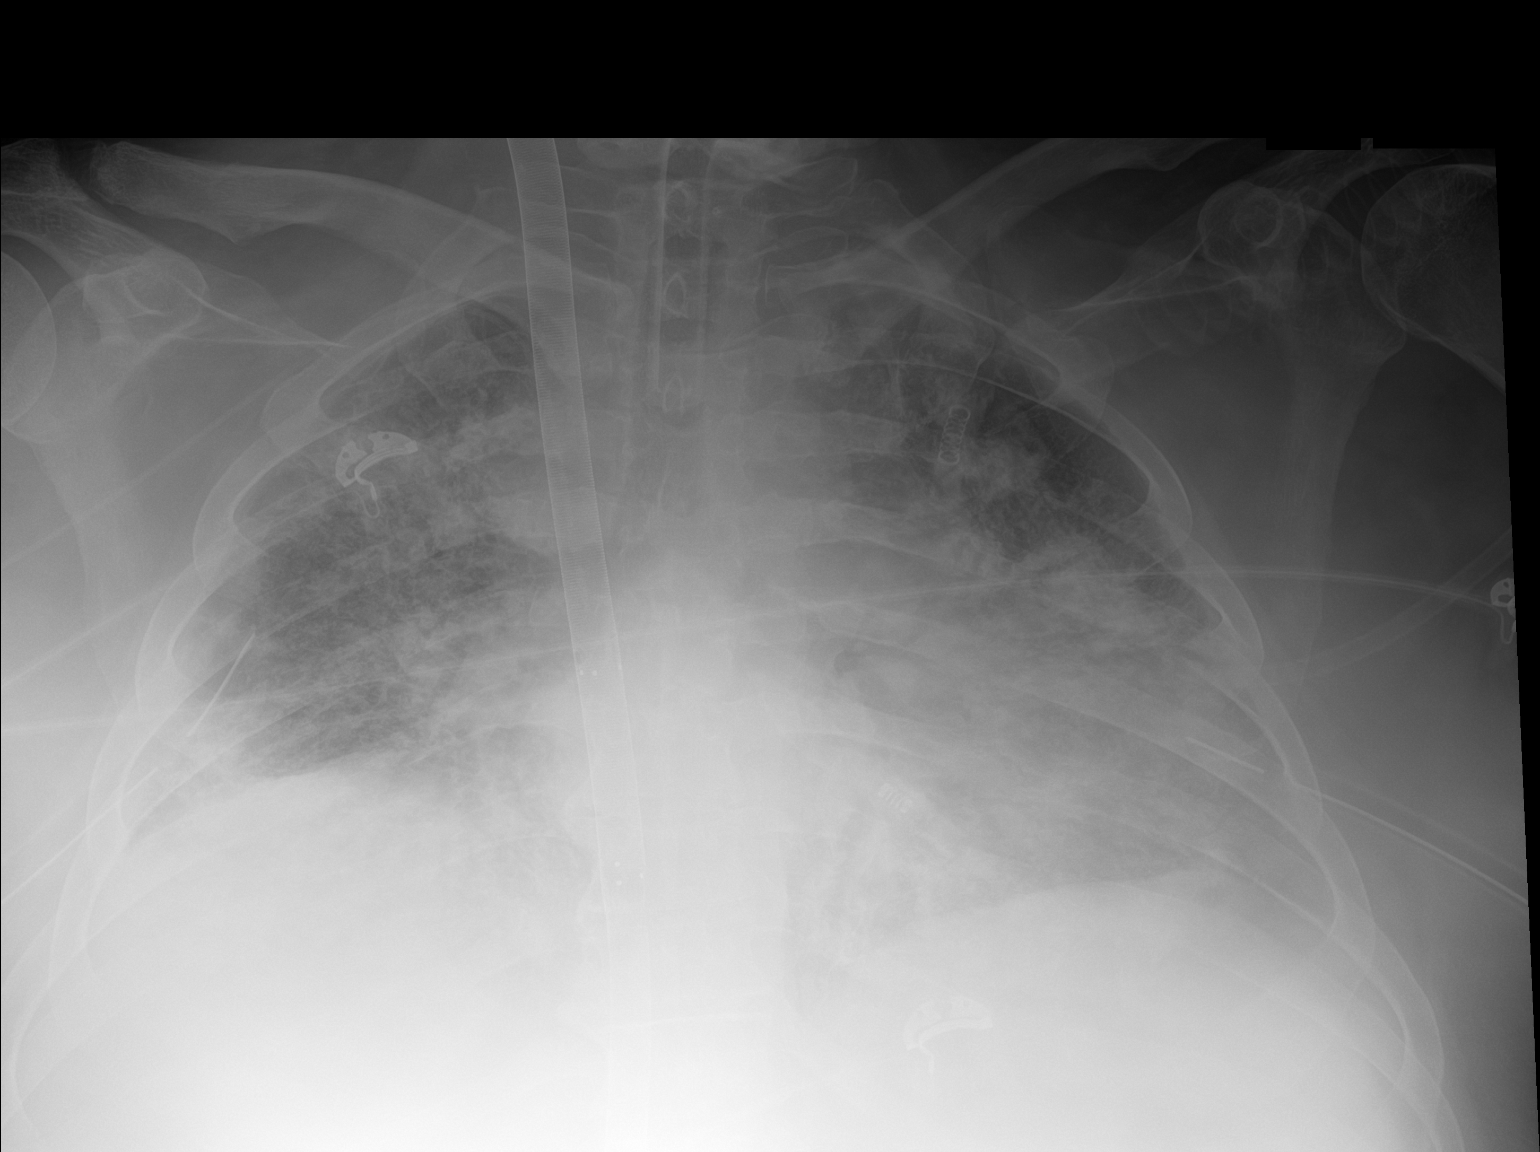

[1 of 1 positions shown; findings below may reference images not displayed]

FINDINGS: Cardiac shadow remains enlarged. Tracheostomy tube and ECMO cannula
are again noted and stable. Bilateral chest tubes are again seen and
stable. The overall inspiratory effort is poor. Patchy opacities are
again seen in both lungs stable from the prior study. No new focal
abnormality is noted.
IMPRESSION: Stable appearance of the chest when compared with the previous day.

## 2021-12-03 NOTE — Progress Notes (Signed)
KALLUM, JORGENSEN ([id number]) ?Visit Report for 12/02/2021 ?Chief Complaint Document Details ?Patient Name: Date of Service: ?[name], [name] 12/02/2021 9:30 A M ?Medical Record Number: [id number] ?Patient Account Number: [id number] ?Date of Birth/Sex: Treating RN: ?[birth year] (46 y.o. [name]) [name] ?Primary Care Provider: [name] Other Clinician: ?Referring Provider: ?Treating Provider/Extender: Fredirick Maudlin ?[name] ?Weeks in Treatment: 72 ?Information Obtained from: Patient ?Chief Complaint ?Bilateral Plantar Foot Ulcers ?Electronic Signature(s) ?Signed: 12/02/2021 10:45:11 AM By: Fredirick Maudlin MD FACS ?Entered By: Fredirick Maudlin on 12/02/2021 10:45:10 ?-------------------------------------------------------------------------------- ?Debridement Details ?Patient Name: Date of Service: ?[name], [name] 12/02/2021 9:30 A M ?Medical Record Number: [id number] ?Patient Account Number: [id number] ?Date of Birth/Sex: Treating RN: ?1976/07/22 (46 y.o. [name]) [name] ?Primary Care Provider: [name] Other Clinician: ?Referring Provider: ?Treating Provider/Extender: Fredirick Maudlin ?[name] ?Weeks in Treatment: 72 ?Debridement Performed for Assessment: Wound #1 Right Calcaneus ?Performed By: Physician Fredirick Maudlin, MD ?Debridement Type: Debridement ?Level of Consciousness (Pre-procedure): Awake and Alert ?Pre-procedure Verification/Time Out Yes - 10:20 ?Taken: ?Start Time: 10:20 ?Pain Control: Lidocaine 4% T opical Solution ?T Area Debrided (L x W): ?otal 2 (cm) x 1.7 (cm) = 3.4 (cm?) ?Tissue and other material debrided: Non-Viable, Callus, Slough, [place] ?Level: Non-Viable Tissue ?Debridement Description: Selective/Open Wound ?Instrument: Curette ?Bleeding: Minimum ?Hemostasis Achieved: Pressure ?End Time: 10:22 ?Procedural Pain: 0 ?Post Procedural Pain: 0 ?Response to Treatment: Procedure was tolerated well ?Level of Consciousness (Post- Awake and Alert ?procedure): ?Post Debridement  Measurements of Total Wound ?Length: (cm) 2 ?Stage: Category/Stage III ?Width: (cm) 1.7 ?Depth: (cm) 0.3 ?Volume: (cm?) 0.801 ?Character of Wound/Ulcer Post Debridement: Improved ?Post Procedure Diagnosis ?Same as Pre-procedure ?Electronic Signature(s) ?Signed: 12/02/2021 12:56:37 PM By: Fredirick Maudlin MD FACS ?Signed: 12/02/2021 5:38:36 PM By: [name] ?Entered By: [name] on 12/02/2021 10:23:04 ?-------------------------------------------------------------------------------- ?Debridement Details ?Patient Name: ?Date of Service: ?[name], [place] E. 12/02/2021 9:30 A M ?Medical Record Number: [id number] ?Patient Account Number: [id number] ?Date of Birth/Sex: ?Treating RN: ?1976-03-31 (46 y.o. [name]) [name] ?Primary Care Provider: [name] ?Other Clinician: ?Referring Provider: ?Treating Provider/Extender: Fredirick Maudlin ?[name] ?Weeks in Treatment: 72 ?Debridement Performed for Assessment: Wound #2 Left Calcaneus ?Performed By: Physician Fredirick Maudlin, MD ?Debridement Type: Debridement ?Level of Consciousness (Pre-procedure): Awake and Alert ?Pre-procedure Verification/Time Out Yes - 10:20 ?Taken: ?Start Time: 10:20 ?Pain Control: Lidocaine 4% T opical Solution ?T Area Debrided (L x W): ?otal 2.3 (cm) x 1 (cm) = 2.3 (cm?) ?Tissue and other material debrided: Non-Viable, Callus, Slough, [place] ?Level: Non-Viable Tissue ?Debridement Description: Selective/Open Wound ?Instrument: Curette ?Bleeding: Minimum ?Hemostasis Achieved: Pressure ?End Time: 10:22 ?Procedural Pain: 0 ?Post Procedural Pain: 0 ?Response to Treatment: Procedure was tolerated well ?Level of Consciousness (Post- Awake and Alert ?procedure): ?Post Debridement Measurements of Total Wound ?Length: (cm) 2.3 ?Stage: Category/Stage III ?Width: (cm) 1 ?Depth: (cm) 0.1 ?Volume: (cm?) 0.181 ?Character of Wound/Ulcer Post Debridement: Improved ?Post Procedure Diagnosis ?Same as Pre-procedure ?Electronic Signature(s) ?Signed:  12/02/2021 12:56:37 PM By: Fredirick Maudlin MD FACS ?Signed: 12/02/2021 5:38:36 PM By: [name] ?Entered By: [name] on 12/02/2021 10:23:50 ?-------------------------------------------------------------------------------- ?HPI Details ?Patient Name: ?Date of Service: ?[name], [place] E. 12/02/2021 9:30 A M ?Medical Record Number: [id number] ?Patient Account Number: [id number] ?Date of Birth/Sex: ?Treating RN: ?08/05/1976 (46 y.o. [name]) [name] ?Primary Care Provider: [name] Other Clinician: ?Referring Provider: ?Treating Provider/Extender: Fredirick Maudlin ?[name] ?Weeks in Treatment: 72 ?History of Present Illness ?HPI Description: Wounds are12/03/2020 upon evaluation today patient presents for initial  inspection here in our clinic concerning issues he has been having ?with the bottoms of his feet bilaterally. He states these actually occurred as wounds when he was hospitalized for 5 months secondary to Covid. He was ?apparently with tilting bed where he was in an upright position quite frequently and apparently this occurred in some way shape or form during that time. ?Fortunately there is no sign of active infection at this time. No fevers, chills, nausea, vomiting, or diarrhea. With that being said he still has substantial wounds ?on the plantar aspects of his feet Theragen require quite a bit of work to get these to heal. He has been using Santyl currently though that is been problematic ?both in receiving the medication as well as actually paid for it as it is become quite expensive. Prior to the experience with Covid the patient really did not have ?any major medical problems other than hypertension he does have some mild generalized weakness following the Covid experience. ?07/22/2020 on evaluation today patient appears to be doing okay in regard to his foot ulcers I feel like the wound beds are showing signs of better ?improvement that I do believe the Iodoflex is helping in this  regard. With that being said he does have a lot of drainage currently and this is somewhat ?blue/green in nature which is consistent with Pseudomonas. I do think a culture today would be appropriate for [place] to evaluate and see if that is indeed the case ?I would likely start him on antibiotic orally as well he is not allergic to Cipro knows of no issues he has had in the past ?12/21; patient was admitted to the clinic earlier this month with bilateral presumed pressure ulcers on the bottom of his feet apparently related to excessive ?pressure from a tilt table arrangement in the intensive care unit. Patient relates this to being on ECMO but I am not really sure that is exactly related to that. I ?must say I have never seen anything like this. He has fairly extensive full-thickness wounds extending from his heel towards his midfoot mostly centered ?laterally. There is already been some healing distally. He does not appear to have an arterial issue. He has been using gentamicin to the wound surfaces with ?Iodoflex to help with ongoing debridement ?1/6; this is a patient with pressure ulcers on the bottom of his feet related to excessive pressure from a standing position in the intensive care unit. He is ?complaining of a lot of pain in the right heel. He is not a diabetic. 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 We have been using ?Iodoflex to help prepare the surfaces of both wounds for an advanced treatment product. He is nonambulatory spending most of his time in a wheelchair I ?have asked him not to propel the wheelchair with his heels ?1/13; in general his wounds look better not much surface area change we have been using Iodoflex as of last week. ?I did an x-ray of the right heel as the patient was complaining of pain. I had some thoughts about a stress fracture perhaps Achilles tendon problems however ?what it showed was erosive changes along the inferior aspect of the calcaneus he now  has a MRI booked for 1/20. ?1/20; in general his wounds continue to be better. Some improvement in the large narrow areas proximally in his foot. He is still complaining of pain in the right ?heel and tenderne

## 2021-12-03 NOTE — Progress Notes (Signed)
MASEN, LUALLEN ([id number]) ?Visit Report for 12/02/2021 ?Arrival Information Details ?Patient Name: Date of Service: ?[name], [name] 12/02/2021 9:30 A M ?Medical Record Number: [id number] ?Patient Account Number: [id number] ?Date of Birth/Sex: Treating RN: ?06/04/1976 (46 y.o. [name]) [name] ?Primary Care [name]: [name] Other Clinician: ?Referring Ausar Georgiou: ?Treating Gumecindo Hopkin/Extender: Fredirick Maudlin ?[name] ?Weeks in Treatment: 72 ?Visit Information History Since Last Visit ?Added or deleted any medications: No ?Patient Arrived: Wheel Chair ?Any new allergies or adverse reactions: No ?Arrival Time: 09:41 ?Had a fall or experienced change in No ?Accompanied By: spouse ?activities of daily living that may affect ?Transfer Assistance: None ?risk of falls: ?Patient Identification Verified: Yes ?Signs or symptoms of abuse/neglect since last visito No ?Patient Requires Transmission-Based Precautions: No ?Hospitalized since last visit: No ?Patient Has Alerts: No ?Implantable device outside of the clinic excluding No ?cellular tissue based products placed in the center ?since last visit: ?Has Dressing in Place as Prescribed: Yes ?Has Compression in Place as Prescribed: Yes ?Pain Present Now: Yes ?Electronic Signature(s) ?Signed: 12/02/2021 5:38:36 PM By: [name] ?Entered By: [name] on 12/02/2021 09:42:02 ?-------------------------------------------------------------------------------- ?Encounter Discharge Information Details ?Patient Name: Date of Service: ?[name], [name] 12/02/2021 9:30 A M ?Medical Record Number: [id number] ?Patient Account Number: [id number] ?Date of Birth/Sex: Treating RN: ?1975-09-13 (46 y.o. [name]) [name] ?Primary Care Kyrstyn Greear: [name] Other Clinician: ?Referring Edword Cu: ?Treating Jendaya Gossett/Extender: Fredirick Maudlin ?[name] ?Weeks in Treatment: 72 ?Encounter Discharge Information Items Post Procedure Vitals ?Discharge Condition:  Stable ?Temperature (F): 98.1 ?Ambulatory Status: Wheelchair ?Pulse (bpm): 81 ?Discharge Destination: Home ?Respiratory Rate (breaths/min): 29 ?Transportation: Private Auto ?Blood Pressure (mmHg): 149/95 ?Accompanied By: spouse ?Schedule Follow-up Appointment: Yes ?Clinical Summary of Care: Patient Declined ?Electronic Signature(s) ?Signed: 12/02/2021 5:38:36 PM By: [name] ?Entered By: [name] on 12/02/2021 17:27:13 ?-------------------------------------------------------------------------------- ?Lower Extremity Assessment Details ?Patient Name: ?Date of Service: ?[name], [place] E. 12/02/2021 9:30 A M ?Medical Record Number: [id number] ?Patient Account Number: [id number] ?Date of Birth/Sex: ?Treating RN: ?[birth year] (46 y.o. [name]) [name] ?Primary Care Aluel Schwarz: [name] ?Other Clinician: ?Referring Donice Alperin: ?Treating Philip Eckersley/Extender: Fredirick Maudlin ?[name] ?Weeks in Treatment: 72 ?Edema Assessment ?Assessed: [Left: No] [Right: No] ?Edema: [Left: No] [Right: No] ?Calf ?Left: Right: ?Point of Measurement: 29 cm From Medial Instep 40.9 cm 44.1 cm ?Ankle ?Left: Right: ?Point of Measurement: 9 cm From Medial Instep 27 cm 31.3 cm ?Electronic Signature(s) ?Signed: 12/02/2021 5:38:36 PM By: [name] ?Entered By: [name] on 12/02/2021 10:08:32 ?-------------------------------------------------------------------------------- ?Multi Wound Chart Details ?Patient Name: ?Date of Service: ?[name], [place] E. 12/02/2021 9:30 A M ?Medical Record Number: [id number] ?Patient Account Number: [id number] ?Date of Birth/Sex: ?Treating RN: ?[birth year] (46 y.o. [name]) [name] ?Primary Care Pam Vanalstine: [name] ?Other Clinician: ?Referring Masiel Gentzler: ?Treating Antuane Eastridge/Extender: Fredirick Maudlin ?[name] ?Weeks in Treatment: 72 ?Vital Signs ?Height(in): 69 ?Pulse(bpm): 81 ?Weight(lbs): 280 ?Blood Pressure(mmHg): 149/95 ?Body Mass Index(BMI): 41.3 ?Temperature(??F): 98.1 ?Respiratory  Rate(breaths/min): 20 ?Photos: [N/A:N/A] ?Right Calcaneus Left Calcaneus N/A ?Wound Location: ?Pressure Injury Pressure Injury N/A ?Wounding Event: ?Pressure Ulcer Pressure Ulcer N/A ?Primary Etiology: ?Asthma, Angina, Hypertension Asthma, Angina, Hypertension N/A ?Comorbid History: ?10/07/2019 10/07/2019 N/A ?Date Acquired: ?[age] N/A ?Weeks of Treatment: ?Open Open N/A ?Wound Status: ?No No N/A ?Wound Recurrence: ?2x1.7x0.3 2.3x1x0.1 N/A ?Measurements L x W x D (cm) ?2.67 1.806 N/A ?A (cm?) : ?rea ?0.801 0.181 N/A ?Volume (cm?) : ?91.60% 93.30% N/A ?% Reduction in A rea: ?97.50% 99.30% N/A ?% Reduction in Volume: ?  Category/Stage III Category/Stage III N/A ?Classification: ?Medium Medium N/A ?Exudate A mount: ?Serosanguineous Serosanguineous N/A ?Exudate Type: ?red, brown red, brown N/A ?Exudate Color: ?Distinct, outline attached Distinct, outline attached N/A ?Wound Margin: ?Medium (34-66%) Medium (34-66%) N/A ?Granulation A mount: ?Pink Red N/A ?Granulation Quality: ?Medium (34-66%) Medium (34-66%) N/A ?Necrotic A mount: ?Adherent White HallNecrotic Tissue: ?Fat Layer (Subcutaneous Tissue): Yes Fat Layer (Subcutaneous Tissue): Yes N/A ?Exposed Structures: ?Fascia: No ?Fascia: No ?Tendon: No ?Tendon: No ?Muscle: No ?Muscle: No ?Joint: No ?Joint: No ?Bone: No ?Bone: No ?Large (67-100%) Small (1-33%) N/A ?Epithelialization: ?Debridement - Selective/Open Wound Debridement - Selective/Open Wound N/A ?Debridement: ?Pre-procedure Verification/Time Out 10:20 10:20 N/A ?Taken: ?Lidocaine 4% T opical Solution Lidocaine 4% T opical Solution N/A ?Pain Control: ?Callus, Slough Callus, Slough N/A ?Tissue Debrided: ?Non-Viable Tissue Non-Viable Tissue N/A ?Level: ?3.4 2.3 N/A ?Debridement A (sq cm): ?rea ?Curette Curette N/A ?Instrument: ?Minimum Minimum N/A ?Bleeding: ?Pressure Pressure N/A ?Hemostasis A chieved: ?0 0 N/A ?Procedural Pain: ?0 0 N/A ?Post Procedural Pain: ?Procedure was tolerated well  Procedure was tolerated well N/A ?Debridement Treatment Response: ?2x1.7x0.3 2.3x1x0.1 N/A ?Post Debridement Measurements L x ?W x D (cm) ?0.801 0.181 N/A ?Post Debridement Volume: (cm?) ?Category/Stage III Category/Stage III N/A ?Post Debridement Stage: ?Debridement Debridement N/A ?Procedures Performed: ?Treatment Notes ?Electronic Signature(s) ?Signed: 12/02/2021 10:26:28 AM By: Fredirick Maudlin MD FACS ?Signed: 12/02/2021 5:38:36 PM By: [name] ?Entered By: Fredirick Maudlin on 12/02/2021 10:26:28 ?-------------------------------------------------------------------------------- ?Multi-Disciplinary Care Plan Details ?Patient Name: ?Date of Service: ?[name], [place] E. 12/02/2021 9:30 A M ?Medical Record Number: [id number] ?Patient Account Number: [id number] ?Date of Birth/Sex: ?Treating RN: ?17-Jun-1976 (46 y.o. [name]) [name] ?Primary Care Pacey Willadsen: [name] ?Other Clinician: ?Referring Karlena Luebke: ?Treating Sherri Levenhagen/Extender: Fredirick Maudlin ?[name] ?Weeks in Treatment: 72 ?Multidisciplinary Care Plan reviewed with physician ?Active Inactive ?Wound/Skin Impairment ?Nursing Diagnoses: ?Impaired tissue integrity ?Knowledge deficit related to ulceration/compromised skin integrity ?Goals: ?Patient/caregiver will verbalize understanding of skin care regimen ?Date Initiated: 07/15/2020 ?Target Resolution Date: 12/31/2021 ?Goal Status: Active ?Ulcer/skin breakdown will have a volume reduction of 30% by week 4 ?Date Initiated: 07/15/2020 ?Date Inactivated: 08/20/2020 ?Target Resolution Date: 09/03/2020 ?Goal Status: Unmet ?Unmet Reason: no major changes. ?Ulcer/skin breakdown will heal within 14 weeks ?Date Initiated: 12/04/2020 ?Date Inactivated: 12/10/2020 ?Target Resolution Date: 12/10/2020 ?Unmet Reason: wounds still open at 14 ?Goal Status: Unmet ?weeks and today 21 weeks. ?Interventions: ?Assess patient/caregiver ability to obtain necessary supplies ?Assess patient/caregiver ability to perform ulcer/skin care  regimen upon admission and as needed ?Assess ulceration(s) every visit ?Provide education on ulcer and skin care ?Treatment Activities: ?Skin care regimen initiated : 07/15/2020 ?Topical wound management initiated : 07/16/19

## 2021-12-04 IMAGING — DX DG CHEST 1V PORT
1 series · 1 of 1 positions shown · non-contrast
Comparison: 09/23/2019

CLINICAL DATA: Ventilator, ECMO

EXAM:
PORTABLE CHEST 1 VIEW

[chest ap]
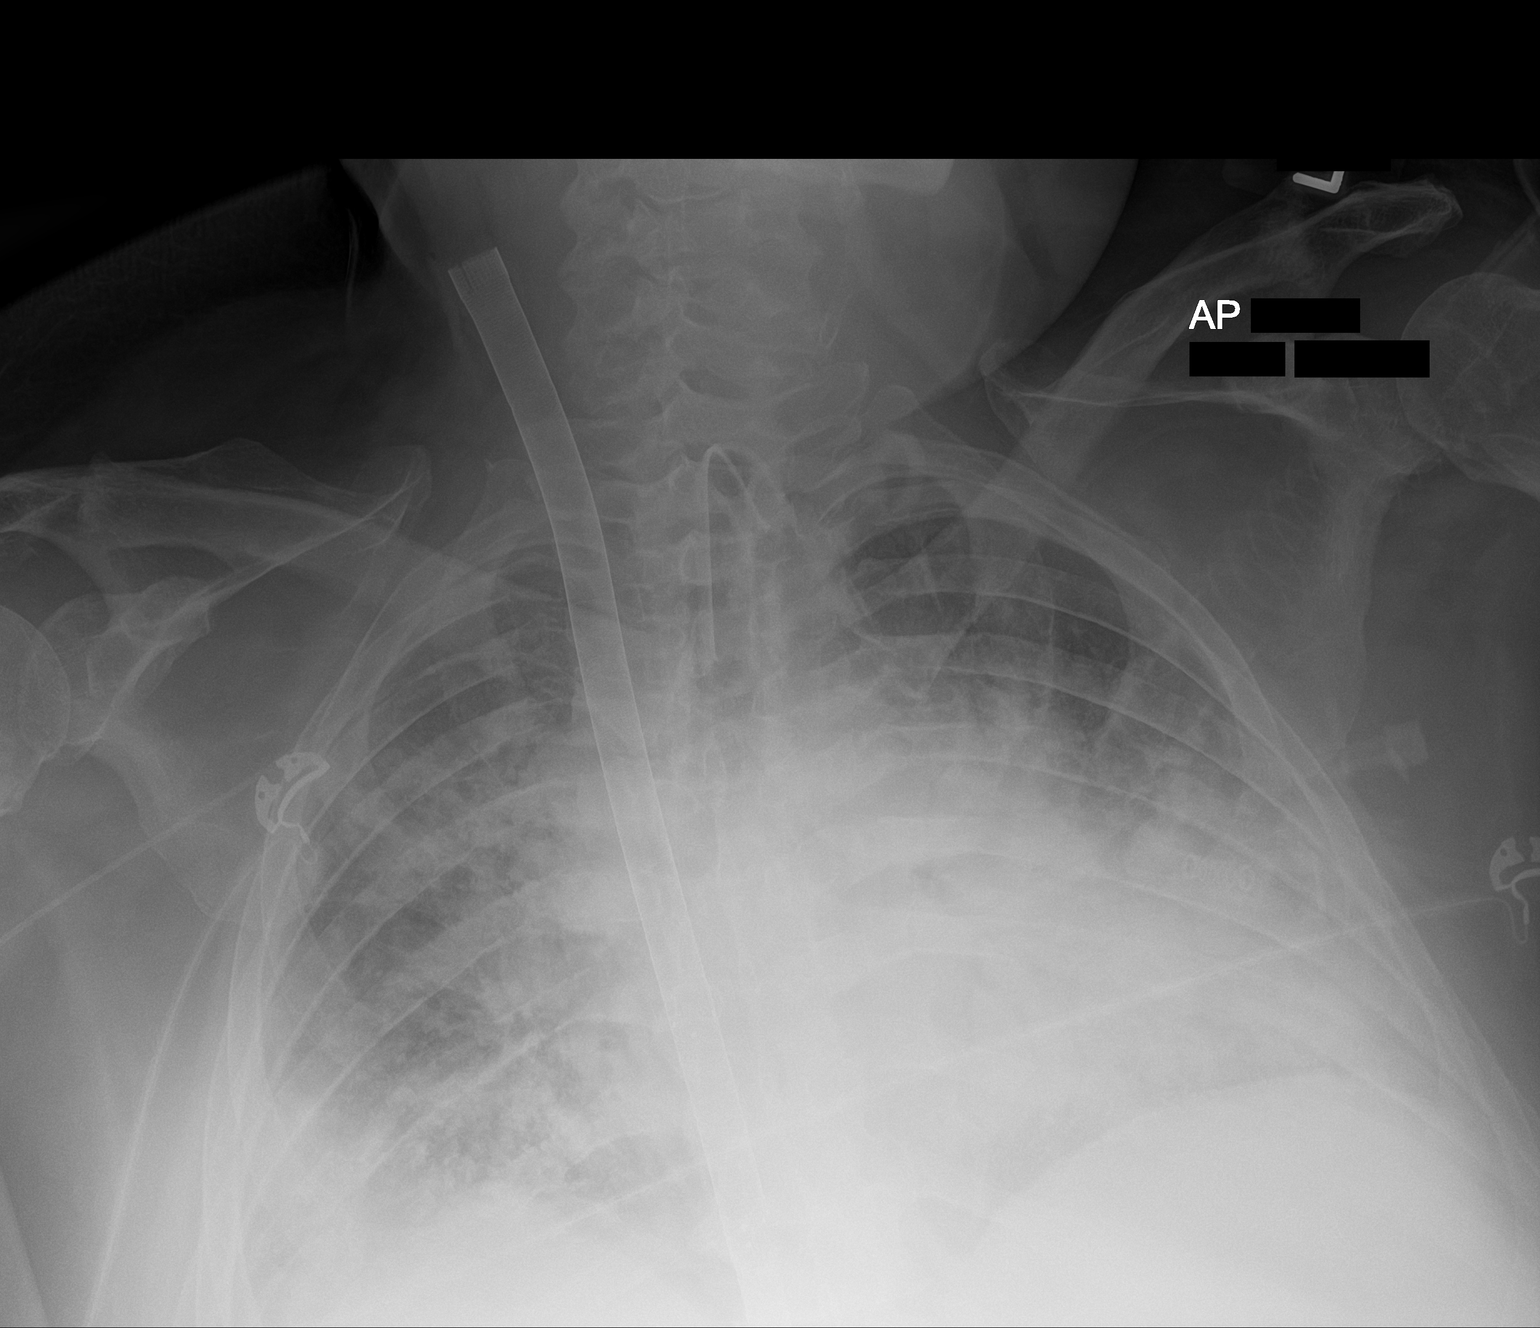

[1 of 1 positions shown; findings below may reference images not displayed]

FINDINGS: Tracheostomy, right PICC line, ECMO catheter remain in place,
unchanged. Cardiomegaly. Severe diffuse bilateral airspace disease
is unchanged. Low lung volumes.
IMPRESSION: Severe bilateral airspace disease, unchanged.

Cardiomegaly, stable.

Support devices stable.

## 2021-12-05 IMAGING — DX DG CHEST 1V PORT
1 series · 1 of 1 positions shown · non-contrast
Comparison: Radiograph yesterday.

CLINICAL DATA: Echo mass. Tracheostomy tube.

EXAM:
PORTABLE CHEST 1 VIEW

[chest ap]
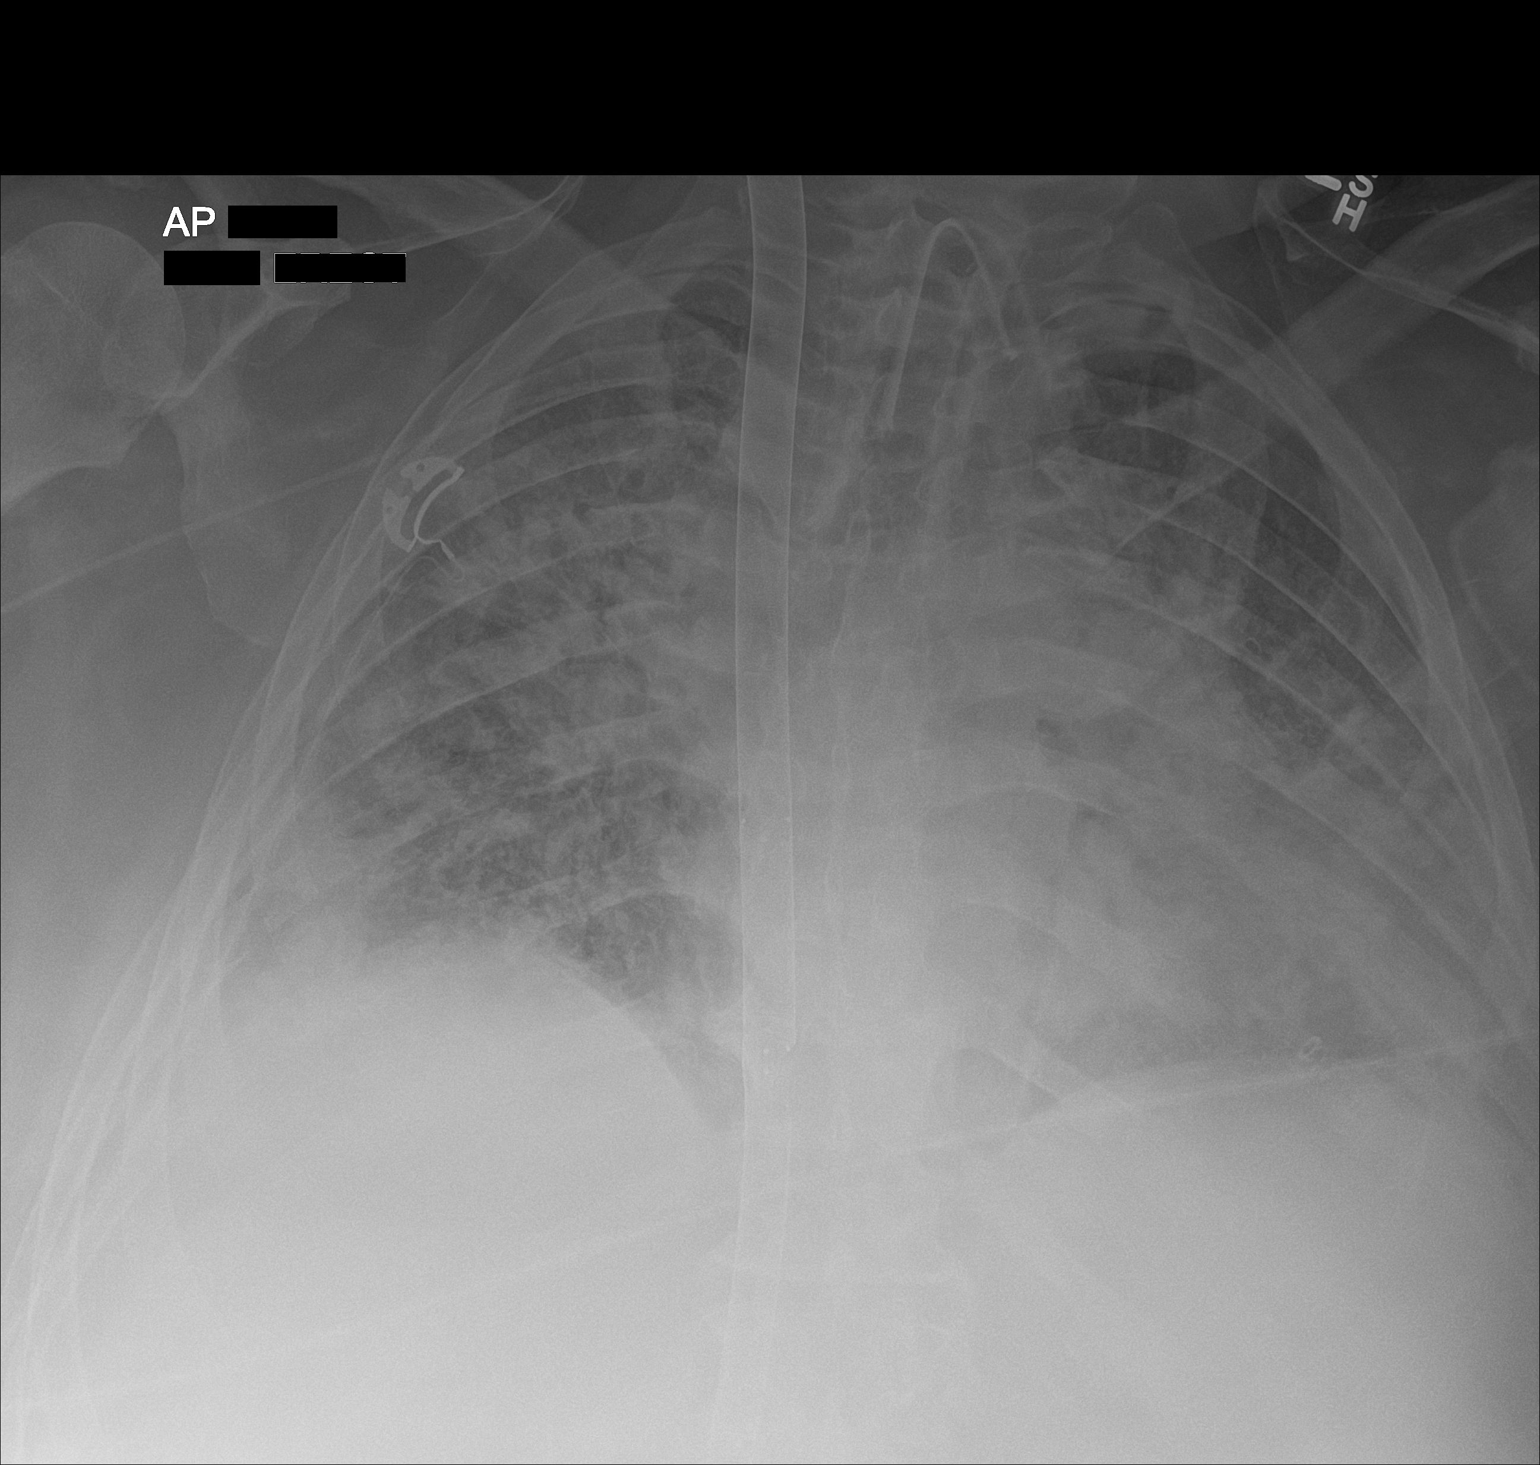

[1 of 1 positions shown; findings below may reference images not displayed]

FINDINGS: Tracheostomy tube tip at the thoracic inlet. Right internal jugular
ECMO catheter extends in the upper abdomen, tip not included in the
field of view. Right upper extremity PICC tip partially obscured by
ECMO catheter, likely in the SVC. Patchy bilateral lung opacities
with slight improvement at the right lung base from prior exam.
Unchanged heart size and mediastinal contours. No visualized
pneumothorax.
IMPRESSION: 1. Patchy bilateral lung opacities with slight improvement at the
right lung base from prior exam.
2. Stable support apparatus.

## 2021-12-06 ENCOUNTER — Encounter: Payer: Self-pay | Admitting: Physical Medicine and Rehabilitation

## 2021-12-06 ENCOUNTER — Encounter
Payer: BC Managed Care – PPO | Attending: Physical Medicine and Rehabilitation | Admitting: Physical Medicine and Rehabilitation

## 2021-12-06 VITALS — BP 126/65 | HR 81

## 2021-12-06 DIAGNOSIS — U099 Post covid-19 condition, unspecified: Secondary | ICD-10-CM | POA: Diagnosis not present

## 2021-12-06 DIAGNOSIS — G6281 Critical illness polyneuropathy: Secondary | ICD-10-CM | POA: Insufficient documentation

## 2021-12-06 DIAGNOSIS — F5105 Insomnia due to other mental disorder: Secondary | ICD-10-CM

## 2021-12-06 DIAGNOSIS — F99 Mental disorder, not otherwise specified: Secondary | ICD-10-CM | POA: Diagnosis not present

## 2021-12-06 DIAGNOSIS — L89890 Pressure ulcer of other site, unstageable: Secondary | ICD-10-CM | POA: Diagnosis not present

## 2021-12-06 DIAGNOSIS — Z993 Dependence on wheelchair: Secondary | ICD-10-CM | POA: Insufficient documentation

## 2021-12-06 HISTORY — DX: Insomnia due to other mental disorder: F51.05

## 2021-12-06 IMAGING — DX DG CHEST 1V PORT
1 series · 1 of 1 positions shown · non-contrast
Comparison: Radiograph yesterday.

CLINICAL DATA: ECMO.

EXAM:
PORTABLE CHEST 1 VIEW

[chest ap]
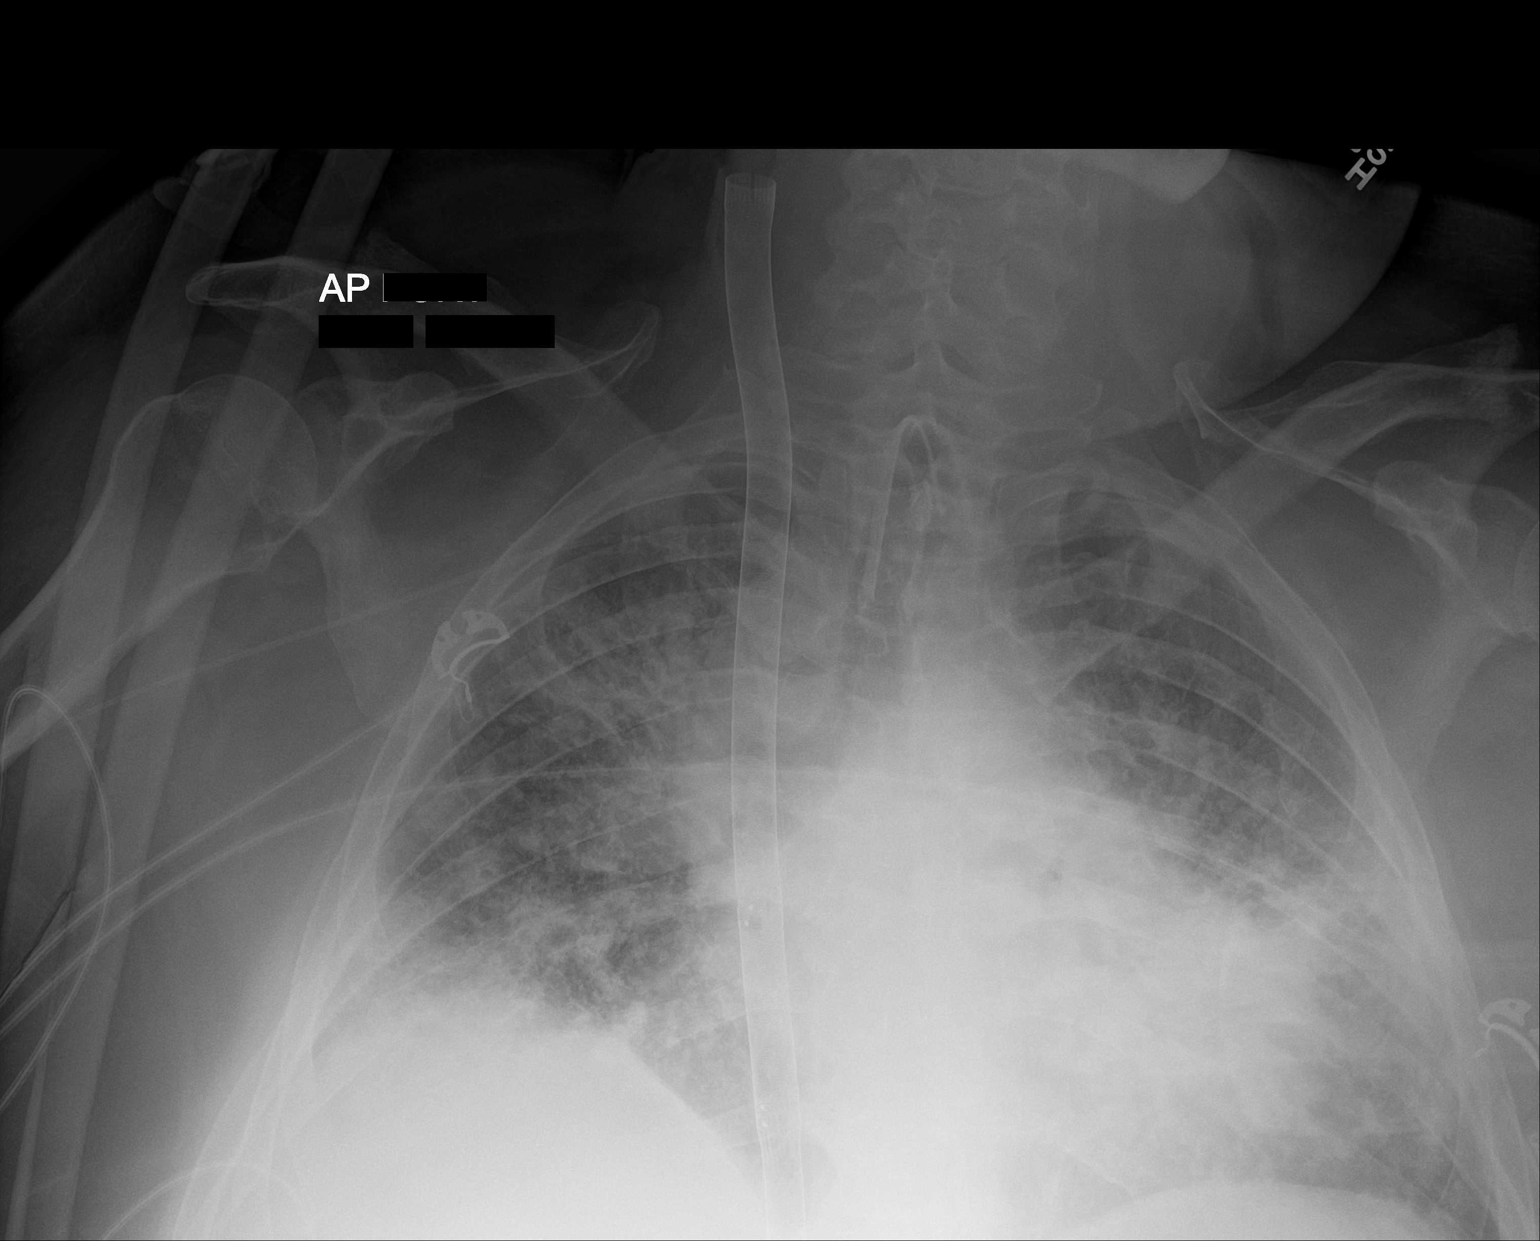

[1 of 1 positions shown; findings below may reference images not displayed]

FINDINGS: Tracheostomy tube tip at the thoracic inlet. Right internal jugular
ECMO catheter tip below the diaphragm not included in the field of
view. Right upper extremity PICC tip in the SVC, partially obscured
by ECMO catheter. Slight improvement in lung volumes from prior.
Heterogeneous bilateral lung opacities, not significantly changed
from prior. Stable heart size and mediastinal contours. No
pneumothorax.
IMPRESSION: 1. Slight improvement in lung volumes from prior. Heterogeneous
bilateral lung opacities are not significantly changed.
2. Support apparatus unchanged.

## 2021-12-06 MED ORDER — TRAZODONE HCL 100 MG PO TABS: 200.0000 mg | ORAL_TABLET | Freq: Every day | ORAL | 3 refills | Status: DC

## 2021-12-06 NOTE — Progress Notes (Signed)
? ?Subjective:  ? ? Patient ID: [name], male    DOB: November 01, 1975, 46 y.o.   MRN: [id number] ? ?HPI ? ? ?Pt is a 46 yr old male with COVID ICU myopathy, Long COVID,  s/p surgery on feet due to necrosis from long ICU stay/pressors to save life-s/p  skin grafts and R foot osteomyelitis as well. ?Also has L foot drop. ?Still w/c dependent- still cannot put weight on feet per Plastics.  ?Here in f/u for critical illness polyneuropathy. ? ?Did some debridement on it last week- and a few weeks prior to to that.  ? ?Still healing slowly.  ?Hasn't taken a picture lately.  ? ?Had Dr Jacalyn Lefevre appt ~ 1 month ago-  ?HbA1c was pretty good per pt- from what he remembers.  ? ?Weight jumping up and down- [id number]- at home- ?Going to see a psychiatrist NP right now-  ?Going to see again next month.  ?Lesle Chris- at [facility]- and getting name of psychologist.  ?He started pt on Lamictal-  ?Last visit 4/13- taking 50 mg daily- and then knocked it back down to 1 pill and then had a couple of episodes that weren't good- ? ?Real depressed and things started weighing on him- so bumped back to 50 mg daily the last 5 days.  ?Needs a refill ? ?Also out of Singulair- gets from [place].  ? ?I had been writing for Duloxetine and Trazodone, but Lesle Chris had written last few times.  ? ?Not keeping self busy enough ?Depressed really bad last week.  ? ?Hasn't come up with hobby yet.  ? ?Knows it's going ot take a long time to emotionally recover from this process.  ?"Not doing things normally"- bothers him A LOT! ?Sets off deep dark depression.  ?Had to have best friend called to help him come out of it- not suicidal.  ? ?Upset things have dragged on so long-- due to feet.  ?Doesn't want to go through NWB on feet for 8 weeks again.  ?Sold hospital bed.  ?In regular bed now. Still not sleeping with wife. Wife snores really bad.  ? ?Stopped seeing marriage counselor months' ago.  ?Things have been better recently.  ? ? ?Pain  Inventory ?Average Pain 2 ?Pain Right Now 2 ?My pain is intermittent and sharp ? ?LOCATION OF PAIN  knee, leg, back, hip ? ?BOWEL ?Number of stools per week: 8 ?Oral laxative use No  ?Type of laxative . ?Enema or suppository use No  ?History of colostomy No  ?Incontinent No  ? ?BLADDER ?Normal ?In and out cath, frequency 2 ?Able to self cath  na ?Bladder incontinence No  ?Frequent urination No  ?Leakage with coughing No  ?Difficulty starting stream No  ?Incomplete bladder emptying No  ? ? ?Mobility ?walk with assistance ?use a walker ?how many minutes can you walk? 2 ?ability to climb steps?  yes ?do you drive?  yes ?use a wheelchair ? ?Function ?disabled: date disabled . ?I need assistance with the following:  bathing, household duties, and shopping ? ?Neuro/Psych ?weakness ?numbness ?spasms ?dizziness ?depression ?anxiety ? ?Prior Studies ?Any changes since last visit?  no ? ?Physicians involved in your care ?Any changes since last visit?  no ? ? ?Family History  ?Problem Relation Age of Onset  ? Asthma Brother   ? Diabetes Father   ? Hypertension Father   ? Diabetes Paternal Grandmother   ? Hypertension Paternal Grandmother   ? Migraines Mother   ? GER disease Mother   ?  Pancreatic cancer Maternal Grandmother   ? Heart disease Maternal Grandfather   ? ?Social History  ? ?Socioeconomic History  ? Marital status: Married  ?  Spouse name: Not on file  ? Number of children: 1  ? Years of education: Not on file  ? Highest education level: Not on file  ?Occupational History  ? Occupation: delivery driver  ?Tobacco Use  ? Smoking status: Never  ? Smokeless tobacco: Never  ?Vaping Use  ? Vaping Use: Never used  ?Substance and Sexual Activity  ? Alcohol use: Yes  ?  Comment: rarely 2-3 times a year  ? Drug use: No  ? Sexual activity: Yes  ?  Partners: Female  ?Other Topics Concern  ? Not on file  ?Social History Narrative  ? Not on file  ? ?Social Determinants of Health  ? ?Financial Resource Strain: Not on file  ?Food  Insecurity: Not on file  ?Transportation Needs: Not on file  ?Physical Activity: Not on file  ?Stress: Not on file  ?Social Connections: Not on file  ? ?Past Surgical History:  ?Procedure Laterality Date  ? CANNULATION FOR ECMO (EXTRACORPOREAL MEMBRANE OXYGENATION) N/A 08/28/2019  ? Procedure: CANNULATION FOR VV ECMO (EXTRACORPOREAL MEMBRANE OXYGENATION);  Surgeon: Prescott Gum, Collier Salina, MD;  Location: [place];  Service: Open Heart Surgery;  Laterality: N/A;  CRESCENT CANNULA  ? CANNULATION FOR ECMO (EXTRACORPOREAL MEMBRANE OXYGENATION) N/A 09/10/2019  ? Procedure: CANNULATION FOR ECMO (EXTRACORPOREAL MEMBRANE OXYGENATION) PUTTING IN CRESCENT 32FR CANNULA  AND REMOVING GROING CANNULATION;  Surgeon: Wonda Olds, MD;  Location: [place];  Service: Open Heart Surgery;  Laterality: N/A;  PUTTING IN CRESCENT/REMOVING GROIN CANNULATION  ? CYSTOSCOPY/URETEROSCOPY/HOLMIUM LASER/STENT PLACEMENT Left 04/18/2019  ? Procedure: LEFT URETEROSCOPY/HOLMIUM LASER/STENT PLACEMENT;  Surgeon: Ardis Hughs, MD;  Location: [facility];  Service: Urology;  Laterality: Left;  ? CYSTOSCOPY/URETEROSCOPY/HOLMIUM LASER/STENT PLACEMENT Left 05/02/2019  ? Procedure: CYSTOSCOPY/URETEROSCOPY/HOLMIUM LASER/STENT EXCHANGE;  Surgeon: Ardis Hughs, MD;  Location: [facility];  Service: Urology;  Laterality: Left;  ? ECMO CANNULATION N/A 08/03/2019  ? Procedure: ECMO CANNULATION;  Surgeon: Wonda Olds, MD;  Location: [place] [facility];  Service: Cardiovascular;  Laterality: N/A;  ? ESOPHAGOGASTRODUODENOSCOPY N/A 09/11/2019  ? Procedure: ESOPHAGOGASTRODUODENOSCOPY (EGD);  Surgeon: Wonda Olds, MD;  Location: [facility];  Service: Thoracic;  Laterality: N/A;  ? GRAFT APPLICATION Bilateral 02/01/349  ? Procedure: APPLICATION OF SKIN GRAFT BILATERAL FEET;  Surgeon: Edrick Kins, DPM;  Location: [facility];  Service: Podiatry;  Laterality: Bilateral;  ? IR REPLC GASTRO/COLONIC TUBE PERCUT W/FLUORO  10/14/2019  ? IRRIGATION AND DEBRIDEMENT  SHOULDER Right 09/29/2017  ? Procedure: IRRIGATION AND DEBRIDEMENT SHOULDER;  Surgeon: Leandrew Koyanagi, MD;  Location: [place];  Service: Orthopedics;  Laterality: Right;  ? Jessie SURGERY  2002  ? NASAL ENDOSCOPY WITH EPISTAXIS CONTROL N/A 08/31/2019  ? Procedure: NASAL ENDOSCOPY WITH EPISTAXIS CONTROL WITH CAUTERIZATION;  Surgeon: Melida Quitter, MD;  Location: [place];  Service: ENT;  Laterality: N/A;  ? PORTACATH PLACEMENT N/A 09/11/2019  ? Procedure: PEG TUBE INSERTION - BEDSIDE;  Surgeon: Wonda Olds, MD;  Location: [place];  Service: Thoracic;  Laterality: N/A;  ? TEE WITHOUT CARDIOVERSION N/A 08/28/2019  ? Procedure: TRANSESOPHAGEAL ECHOCARDIOGRAM (TEE);  Surgeon: Prescott Gum, Collier Salina, MD;  Location: [place];  Service: Open Heart Surgery;  Laterality: N/A;  ? TEE WITHOUT CARDIOVERSION N/A 09/10/2019  ? Procedure: TRANSESOPHAGEAL ECHOCARDIOGRAM (TEE);  Surgeon: Wonda Olds, MD;  Location: [place];  Service:  Open Heart Surgery;  Laterality: N/A;  ? WOUND DEBRIDEMENT Bilateral 03/27/2020  ? Procedure: EXCISIONAL DEBRIDEMENT OF ULCERS BILATERAL FEET;  Surgeon: Edrick Kins, DPM;  Location: [facility];  Service: Podiatry;  Laterality: Bilateral;  ? ?Past Medical History:  ?Diagnosis Date  ? Anginal pain ([place])   ? with covid  ? Anxiety   ? Asthma   ? Dyspnea   ? GERD (gastroesophageal reflux disease)   ? Headache   ? History of kidney stones   ? LEFT URETERAL STONE  ? HTN (hypertension)   ? Pancreatitis 2018  ? GALLBALDDER SLUDGE CAUSED ISSUED RESOLVED  ? Pneumonia 07/2019  ? covid  ? ?BP 126/65   Pulse 81   SpO2 91%  ? ?Opioid Risk Score:   ?Fall Risk Score:  `1 ? ?Depression screen PHQ 2/9 ? ? ?  10/25/2021  ?  1:45 PM 07/21/2021  ?  3:12 PM 06/04/2021  ?  1:53 PM 03/03/2021  ? 10:08 AM 01/01/2021  ?  9:26 AM 11/04/2020  ?  9:38 AM 10/15/2020  ?  1:54 PM  ?Depression screen PHQ 2/9  ?Decreased Interest   1 1 0 1 0  ?Down, Depressed, Hopeless   1 1 0 1 1  ?PHQ - 2 Score   2 2 0 2 1  ?Altered sleeping         ?Tired, decreased  energy         ?Change in appetite         ?Feeling bad or failure about yourself          ?Trouble concentrating         ?Moving slowly or fidgety/restless         ?Suicidal thoughts         ?PHQ-9 Score         ?  ? Inf

## 2021-12-06 NOTE — Patient Instructions (Signed)
Pt is a 46 yr old male with COVID ICU myopathy, Long COVID,  s/p surgery on feet due to necrosis from long ICU stay/pressors to save life-s/p  skin grafts and R foot osteomyelitis as well. ?Also has L foot drop. ?Still w/c dependent- still cannot put weight on feet per Plastics.  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 ? ?Will refill Trazodone 200 mg QHS 3 months supply 3 refills.  ?2. Needs to get refill of Lamictal ASAP since you can tank mood wise for symptoms if runs out.  ? ? ?3. Off Abilify, on Lamictal per psychiatry NP. ? ? ?4. Suggest searching pinterest for ideas of hobby.  ? ?5. No other meds from me. Have ot get from other doctor's to complete meds needed.  ? ?6.  Marriage is not 50/50-  ? ?7. Because walking on feet- slows B/L foot healing. Suggest trying to stay off feet except for transfers x 1 week- and see if wound healing is better. See if that makes a difference to wound healing.  ? ?8. F/U in 3 months- double visit.  ?  ?

## 2021-12-07 DIAGNOSIS — G4734 Idiopathic sleep related nonobstructive alveolar hypoventilation: Secondary | ICD-10-CM | POA: Diagnosis not present

## 2021-12-07 DIAGNOSIS — U071 COVID-19: Secondary | ICD-10-CM | POA: Diagnosis not present

## 2021-12-07 IMAGING — DX DG CHEST 1V PORT
1 series · 1 of 1 positions shown · non-contrast
Comparison: 09/26/2019.  09/25/2019.

CLINICAL DATA: Tracheostomy.  ECMO.

EXAM:
PORTABLE CHEST 1 VIEW

[chest ap]
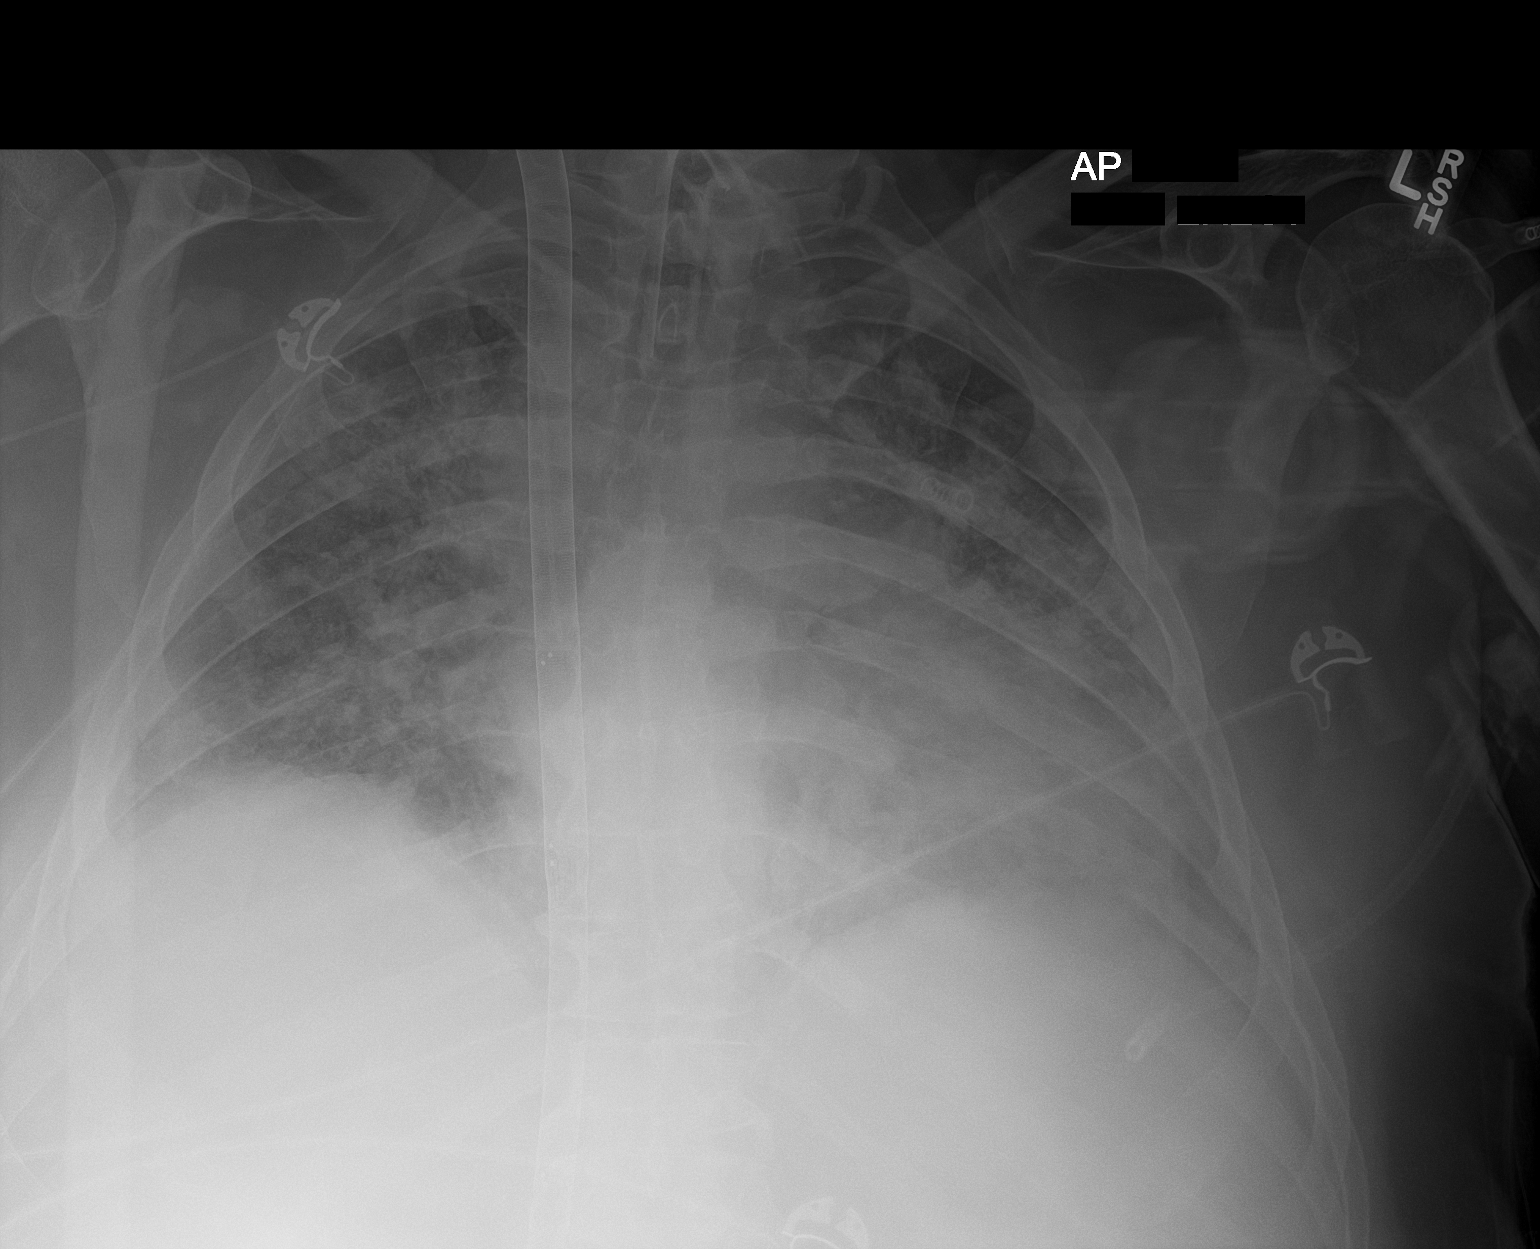

[1 of 1 positions shown; findings below may reference images not displayed]

FINDINGS: Tracheostomy tube and ECMO cannula in stable position. Right PICC
line in stable position. Stable cardiomegaly. Diffuse unchanged
bilateral pulmonary infiltrates/edema. No prominent pleural effusion
noted. No pneumothorax.
IMPRESSION: 1. Tracheostomy tube, ECMO cannula, right PICC line stable position.

2.  Stable cardiomegaly.

3.  Unchanged diffuse bilateral pulmonary infiltrates/edema.

## 2021-12-08 IMAGING — DX DG CHEST 1V PORT
1 series · 1 of 1 positions shown · non-contrast
Comparison: Yesterday

CLINICAL DATA: Cardio respiratory failure

EXAM:
PORTABLE CHEST 1 VIEW

[chest ap]
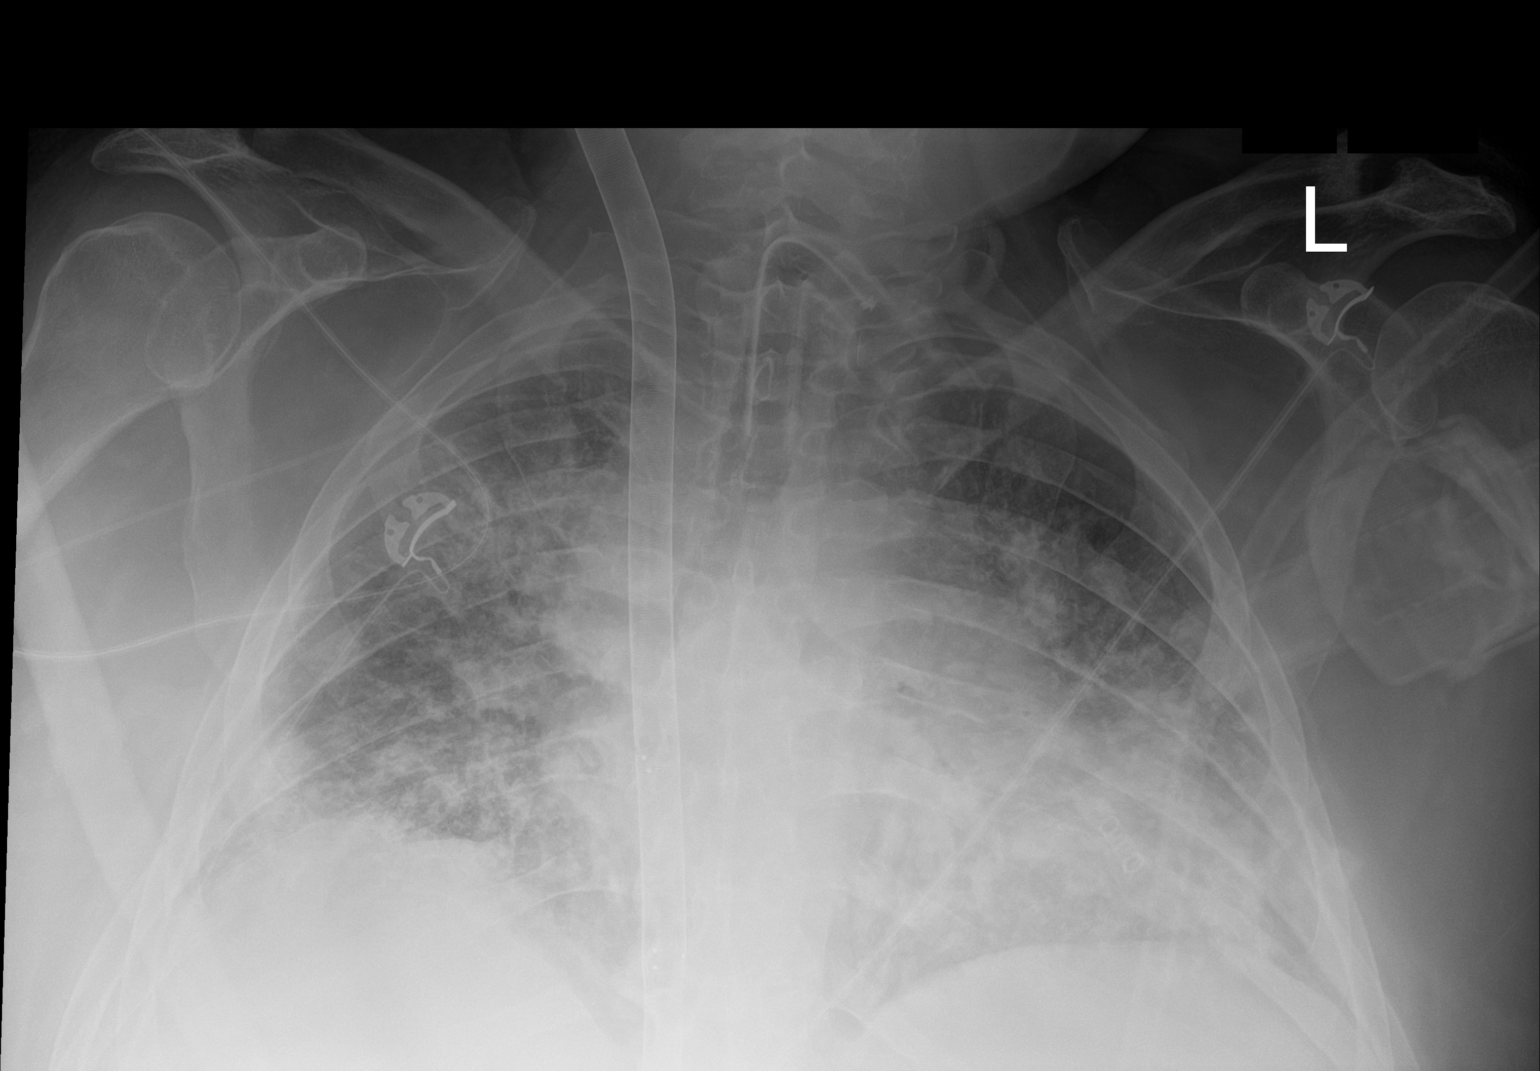

[1 of 1 positions shown; findings below may reference images not displayed]

FINDINGS: Cardiomegaly and vascular pedicle widening. Low volume chest with
diffuse patchy infiltrate. Stable cannula related to ECMO.
Tracheostomy tube in place. No visible effusion or pneumothorax.
IMPRESSION: Stable hardware, inflation, and airspace disease.

## 2021-12-09 ENCOUNTER — Encounter (HOSPITAL_BASED_OUTPATIENT_CLINIC_OR_DEPARTMENT_OTHER): Payer: BC Managed Care – PPO | Attending: General Surgery | Admitting: General Surgery

## 2021-12-09 DIAGNOSIS — L97522 Non-pressure chronic ulcer of other part of left foot with fat layer exposed: Secondary | ICD-10-CM | POA: Insufficient documentation

## 2021-12-09 DIAGNOSIS — Z8616 Personal history of COVID-19: Secondary | ICD-10-CM | POA: Insufficient documentation

## 2021-12-09 DIAGNOSIS — L97512 Non-pressure chronic ulcer of other part of right foot with fat layer exposed: Secondary | ICD-10-CM | POA: Insufficient documentation

## 2021-12-09 IMAGING — DX DG CHEST 1V PORT
1 series · 1 of 1 positions shown · non-contrast
Comparison: Chest radiograph from one day prior.

CLINICAL DATA: [id number]-PB pneumonia, ECMO, acute respiratory failure
with hypoxia

EXAM:
PORTABLE CHEST 1 VIEW

[chest]
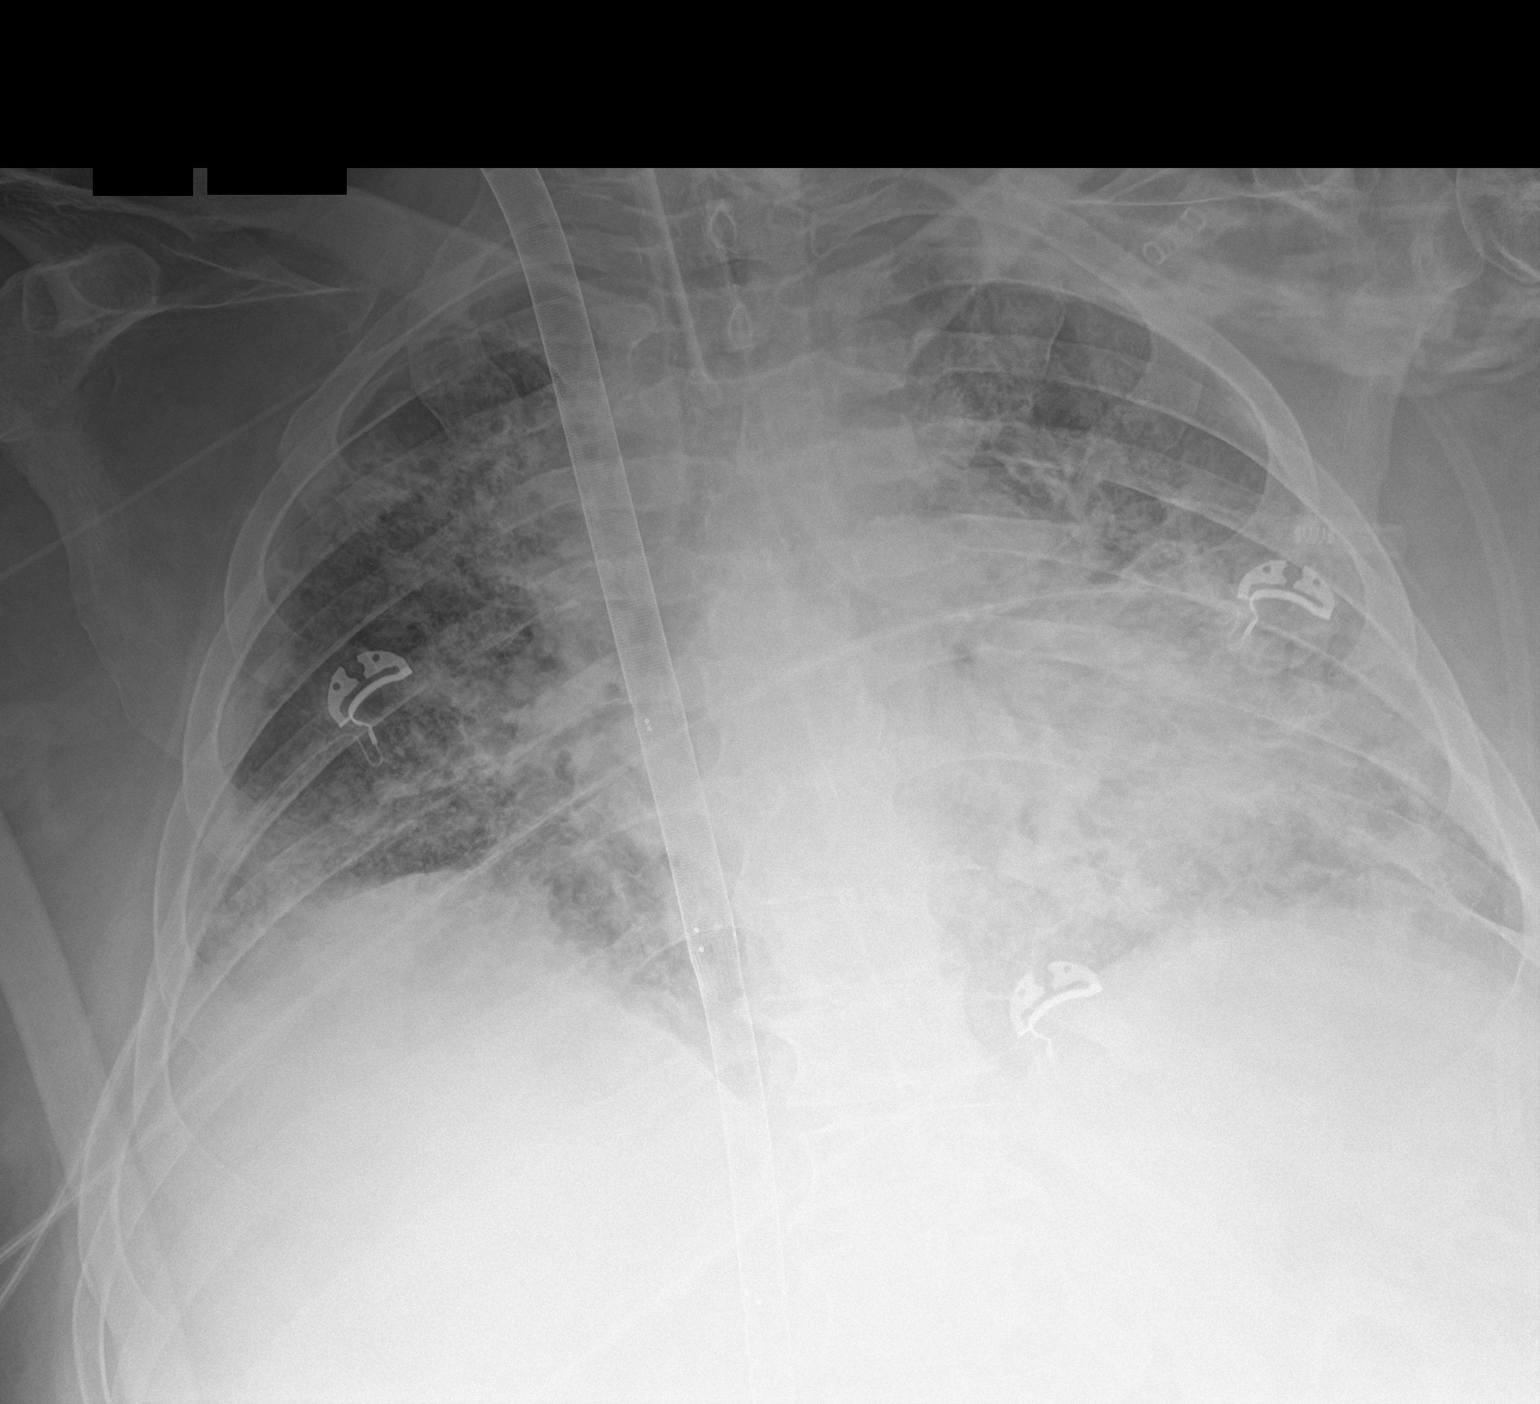

[1 of 1 positions shown; findings below may reference images not displayed]

FINDINGS: Tracheostomy tube tip overlies the tracheal air column at the
thoracic inlet. Right internal jugular ECMO catheter enters the IVC,
with the tip not seen on this image. Right PICC enters the right
mediastinum with tip obscured by ECHO catheter. Stable
cardiomediastinal silhouette with top-normal heart size. No
pneumothorax. No pleural effusion. Extensive patchy opacities in
both lungs have not substantially changed.
IMPRESSION: 1. Support structures as detailed.  No pneumothorax.
2. Extensive patchy opacities in both lungs have not substantially
changed.

## 2021-12-09 NOTE — Progress Notes (Signed)
[name], [name] ([id number]) ?Visit Report for 12/09/2021 ?Arrival Information Details ?Patient Name: Date of Service: ?[name], [name] 12/09/2021 9:15 A M ?Medical Record Number: [id number] ?Patient Account Number: [id number] ?Date of Birth/Sex: Treating RN: ?04/06/1976 (46 y.o. [name] ?Primary Care Jaymen Fetch: [name] Other Clinician: ?Referring [name]: ?Treating [name]/Extender: Fredirick Maudlin ?[name] ?Weeks in Treatment: 40 ?Visit Information History Since Last Visit ?Added or deleted any medications: No ?Patient Arrived: Wheel Chair ?Any new allergies or adverse reactions: No ?Arrival Time: 09:33 ?Had a fall or experienced change in No ?Accompanied By: wife ?activities of daily living that may affect ?Transfer Assistance: None ?risk of falls: ?Patient Identification Verified: Yes ?Signs or symptoms of abuse/neglect since last visito No ?Secondary Verification Process Completed: Yes ?Hospitalized since last visit: No ?Patient Requires Transmission-Based Precautions: No ?Implantable device outside of the clinic excluding No ?Patient Has Alerts: No ?cellular tissue based products placed in the center ?since last visit: ?Has Dressing in Place as Prescribed: Yes ?Pain Present Now: No ?Electronic Signature(s) ?Signed: 12/09/2021 11:10:54 AM By: [name] ?Entered By: [name] on 12/09/2021 09:33:41 ?-------------------------------------------------------------------------------- ?Encounter Discharge Information Details ?Patient Name: Date of Service: ?[name], [name] 12/09/2021 9:15 A M ?Medical Record Number: [id number] ?Patient Account Number: [id number] ?Date of Birth/Sex: Treating RN: ?Dec 19, 1975 (46 y.o. [name] ?Primary Care Devanny Palecek: [name] Other Clinician: ?Referring Jacqlyn Marolf: ?Treating Terrian Ridlon/Extender: Fredirick Maudlin ?[name] ?Weeks in Treatment: 26 ?Encounter Discharge Information Items Post Procedure Vitals ?Discharge Condition: Stable ?Temperature (F):  98.1 ?Ambulatory Status: Wheelchair ?Pulse (bpm): 83 ?Discharge Destination: Home ?Respiratory Rate (breaths/min): 20 ?Transportation: Private Auto ?Blood Pressure (mmHg): 157/83 ?Accompanied By: alone ?Schedule Follow-up Appointment: Yes ?Clinical Summary of Care: Patient Declined ?Electronic Signature(s) ?Signed: 12/09/2021 6:01:57 PM By: [name] Hurst RN, BSN ?Entered By: [name] on 12/09/2021 16:38:05 ?-------------------------------------------------------------------------------- ?Lower Extremity Assessment Details ?Patient Name: ?Date of Service: ?[name], [name] 12/09/2021 9:15 A M ?Medical Record Number: [id number] ?Patient Account Number: [id number] ?Date of Birth/Sex: ?Treating RN: ?[birth year] (46 y.o. [name] ?Primary Care [name]: [name] ?Other Clinician: ?Referring Braylie Badami: ?Treating Wessie Shanks/Extender: Fredirick Maudlin ?[name] ?Weeks in Treatment: 26 ?Edema Assessment ?Assessed: [Left: No] [Right: No] ?Edema: [Left: No] [Right: No] ?Calf ?Left: Right: ?Point of Measurement: 29 cm From Medial Instep 40.5 cm 44 cm ?Ankle ?Left: Right: ?Point of Measurement: 9 cm From Medial Instep 27 cm 31 cm ?Vascular Assessment ?Pulses: ?Dorsalis Pedis ?Palpable: [Left:Yes] [Right:Yes] ?Electronic Signature(s) ?Signed: 12/09/2021 6:01:57 PM By: [name] Hurst RN, BSN ?Entered By: [name] on 12/09/2021 09:53:42 ?-------------------------------------------------------------------------------- ?Multi Wound Chart Details ?Patient Name: ?Date of Service: ?[name], [name] 12/09/2021 9:15 A M ?Medical Record Number: [id number] ?Patient Account Number: [id number] ?Date of Birth/Sex: ?Treating RN: ?1976/03/05 (46 y.o. [name] ?Primary Care Andrews Tener: [name] ?Other Clinician: ?Referring [name]: ?Treating Deforrest Bogle/Extender: Fredirick Maudlin ?[name] ?Weeks in Treatment: 13 ?Vital Signs ?Height(in): 69 ?Pulse(bpm): 83 ?Weight(lbs): 280 ?Blood Pressure(mmHg): 157/83 ?Body Mass  Index(BMI): 41.3 ?Temperature(??F): 98.1 ?Respiratory Rate(breaths/min): 20 ?Photos: [1:No Photos Right Calcaneus] [2:No Photos Left Calcaneus] [N/A:N/A N/A] ?Wound Location: [1:Pressure Injury] [2:Pressure Injury] [N/A:N/A] ?Wounding Event: [1:Pressure Ulcer] [2:Pressure Ulcer] [N/A:N/A] ?Primary Etiology: [1:Asthma, Angina, Hypertension] [2:Asthma, Angina, Hypertension] [N/A:N/A] ?Comorbid History: [1:10/07/2019] [2:10/07/2019] [N/A:N/A] ?Date Acquired: [1:73] [2:73] [N/A:N/A] ?Weeks of Treatment: [1:Open] [2:Open] [N/A:N/A] ?Wound Status: [1:No] [2:No] [N/A:N/A] ?Wound Recurrence: [1:1.2x0.4x0.3] [2:2x0.6x0.2] [N/A:N/A] ?Measurements L x W x D (cm) [1:0.377] [2:0.942] [N/A:N/A] ?A (cm?) : ?rea [1:0.113] [2:0.188] [N/A:N/A] ?Volume (cm?) : [1:98.80%] [2:96.50%] [N/A:N/A] ?%  Reduction in A rea: [1:99.60%] [2:99.30%] [N/A:N/A] ?% Reduction in Volume: [1:Category/Stage III] [2:Category/Stage III] [N/A:N/A] ?Classification: [1:Medium] [2:Medium] [N/A:N/A] ?Exudate A mount: [1:Serosanguineous] [2:Serosanguineous] [N/A:N/A] ?Exudate Type: [1:red, brown] [2:red, brown] [N/A:N/A] ?Exudate Color: [1:Distinct, outline attached] [2:Distinct, outline attached] [N/A:N/A] ?Wound Margin: [1:Large (67-100%)] [2:Medium (34-66%)] [N/A:N/A] ?Granulation A mount: [1:Pink] [2:Red, Pink] [N/A:N/A] ?Granulation Quality: [1:Small (1-33%)] [2:Medium (34-66%)] [N/A:N/A] ?Necrotic A mount: ?[1:Fat Layer (Subcutaneous Tissue): Yes Fat Layer (Subcutaneous Tissue): Yes N/A] ?Exposed Structures: ?[1:Fascia: No Tendon: No Muscle: No Joint: No Bone: No Medium (34-66%)] [2:Fascia: No Tendon: No Muscle: No Joint: No Bone: No Medium (34-66%)] [N/A:N/A] ?Epithelialization: [1:N/A] [2:Debridement - Selective/Open Wound N/A] ?Debridement: ?Pre-procedure Verification/Time Out N/A [2:09:59] [N/A:N/A] ?Taken: [1:N/A] [2:Callus] [N/A:N/A] ?Tissue Debrided: [1:N/A] [2:Non-Viable Tissue] [N/A:N/A] ?Level: [1:N/A] [2:1.2] [N/A:N/A] ?Debridement A (sq cm): [1:rea  N/A] [2:Curette] [N/A:N/A] ?Instrument: [1:N/A] [2:Minimum] [N/A:N/A] ?Bleeding: [1:N/A] [2:Pressure] [N/A:N/A] ?Hemostasis A chieved: [1:N/A] [2:0] [N/A:N/A] ?Procedural Pain: [1:N/A] [2:0] [N/A:N/A] ?Post Procedural Pain: [1:N/A] [2:Procedure was tolerated well] [N/A:N/A] ?Debridement Treatment Response: [1:N/A] [2:2x0.6x0.2] [N/A:N/A] ?Post Debridement Measurements L x ?W x D (cm) [1:N/A] [2:0.188] [N/A:N/A] ?Post Debridement Volume: (cm?) [1:N/A] [2:Category/Stage III] [N/A:N/A] ?Post Debridement Stage: [1:N/A] [2:Debridement] [N/A:N/A] ?Procedures Performed: [2:T Contact Cast otal] ?Treatment Notes ?Electronic Signature(s) ?Signed: 12/09/2021 10:04:19 AM By: Fredirick Maudlin MD FACS ?Signed: 12/09/2021 6:01:57 PM By: [name] Hurst RN, BSN ?Entered By: Fredirick Maudlin on 12/09/2021 10:04:19 ?-------------------------------------------------------------------------------- ?Multi-Disciplinary Care Plan Details ?Patient Name: ?Date of Service: ?[name], [name] 12/09/2021 9:15 A M ?Medical Record Number: [id number] ?Patient Account Number: [id number] ?Date of Birth/Sex: ?Treating RN: ?Jul 31, 1976 (46 y.o. [name] ?Primary Care Demani Mcbrien: [name] ?Other Clinician: ?Referring Bralyn Folkert: ?Treating July Linam/Extender: Fredirick Maudlin ?[name] ?Weeks in Treatment: 71 ?Multidisciplinary Care Plan reviewed with physician ?Active Inactive ?Wound/Skin Impairment ?Nursing Diagnoses: ?Impaired tissue integrity ?Knowledge deficit related to ulceration/compromised skin integrity ?Goals: ?Patient/caregiver will verbalize understanding of skin care regimen ?Date Initiated: 07/15/2020 ?Target Resolution Date: 12/31/2021 ?Goal Status: Active ?Ulcer/skin breakdown will have a volume reduction of 30% by week 4 ?Date Initiated: 07/15/2020 ?Date Inactivated: 08/20/2020 ?Target Resolution Date: 09/03/2020 ?Goal Status: Unmet ?Unmet Reason: no major changes. ?Ulcer/skin breakdown will heal within 14 weeks ?Date Initiated:  12/04/2020 ?Date Inactivated: 12/10/2020 ?Target Resolution Date: 12/10/2020 ?Unmet Reason: wounds still open at 14 ?Goal Status: Unmet ?weeks and today 21 weeks. ?Interventions: ?Assess patient/caregiver ability to [facility]

## 2021-12-09 NOTE — Progress Notes (Signed)
[name] ([id number]) ?Visit Report for 12/09/2021 ?Chief Complaint Document Details ?Patient Name: Date of Service: ?[name], [name] 12/09/2021 9:15 A M ?Medical Record Number: [id number] ?Patient Account Number: [id number] ?Date of Birth/Sex: Treating RN: ?1975/09/17 (46 y.o. [name] ?Primary Care Provider: [name] Other Clinician: ?Referring Provider: ?Treating Provider/Extender: Fredirick Maudlin ?[name] ?Weeks in Treatment: 78 ?Information Obtained from: Patient ?Chief Complaint ?Bilateral Plantar Foot Ulcers ?Electronic Signature(s) ?Signed: 12/09/2021 10:04:32 AM By: Fredirick Maudlin MD FACS ?Entered By: Fredirick Maudlin on 12/09/2021 10:04:32 ?-------------------------------------------------------------------------------- ?Debridement Details ?Patient Name: Date of Service: ?[name], [name] 12/09/2021 9:15 A M ?Medical Record Number: [id number] ?Patient Account Number: [id number] ?Date of Birth/Sex: Treating RN: ?02-Sep-1975 (46 y.o. [name] ?Primary Care Provider: [name] Other Clinician: ?Referring Provider: ?Treating Provider/Extender: Fredirick Maudlin ?[name] ?Weeks in Treatment: 77 ?Debridement Performed for Assessment: Wound #2 Left Calcaneus ?Performed By: Physician Fredirick Maudlin, MD ?Debridement Type: Debridement ?Level of Consciousness (Pre-procedure): Awake and Alert ?Pre-procedure Verification/Time Out Yes - 09:59 ?Taken: ?Start Time: 09:59 ?T Area Debrided (L x W): ?otal 2 (cm) x 0.6 (cm) = 1.2 (cm?) ?Tissue and other material debrided: Non-Viable, Callus ?Level: Non-Viable Tissue ?Debridement Description: Selective/Open Wound ?Instrument: Curette ?Bleeding: Minimum ?Hemostasis Achieved: Pressure ?End Time: 10:01 ?Procedural Pain: 0 ?Post Procedural Pain: 0 ?Response to Treatment: Procedure was tolerated well ?Level of Consciousness (Post- Awake and Alert ?procedure): ?Post Debridement Measurements of Total Wound ?Length: (cm) 2 ?Stage: Category/Stage  III ?Width: (cm) 0.6 ?Depth: (cm) 0.2 ?Volume: (cm?) 0.188 ?Character of Wound/Ulcer Post Debridement: Improved ?Post Procedure Diagnosis ?Same as Pre-procedure ?Electronic Signature(s) ?Signed: 12/09/2021 11:46:31 AM By: Fredirick Maudlin MD FACS ?Signed: 12/09/2021 6:01:57 PM By: Levan Hurst RN, BSN ?Entered By: Levan Hurst on 12/09/2021 10:00:50 ?-------------------------------------------------------------------------------- ?HPI Details ?Patient Name: Date of Service: ?[name], [name] 12/09/2021 9:15 A M ?Medical Record Number: [id number] ?Patient Account Number: [id number] ?Date of Birth/Sex: Treating RN: ?June 23, 1976 (46 y.o. [name] ?Primary Care Provider: [name] Other Clinician: ?Referring Provider: ?Treating Provider/Extender: Fredirick Maudlin ?[name] ?Weeks in Treatment: 28 ?History of Present Illness ?HPI Description: Wounds are12/03/2020 upon evaluation today patient presents for initial inspection here in our clinic concerning issues he has been having ?with the bottoms of his feet bilaterally. He states these actually occurred as wounds when he was hospitalized for 5 months secondary to Covid. He was ?apparently with tilting bed where he was in an upright position quite frequently and apparently this occurred in some way shape or form during that time. ?Fortunately there is no sign of active infection at this time. No fevers, chills, nausea, vomiting, or diarrhea. With that being said he still has substantial wounds ?on the plantar aspects of his feet Theragen require quite a bit of work to get these to heal. He has been using Santyl currently though that is been problematic ?both in receiving the medication as well as actually paid for it as it is become quite expensive. Prior to the experience with Covid the patient really did not have ?any major medical problems other than hypertension he does have some mild generalized weakness following the Covid experience. ?07/22/2020 on  evaluation today patient appears to be doing okay in regard to his foot ulcers I feel like the wound beds are showing signs of better ?improvement that I do believe the Iodoflex is helping in this regard. With that being said he does have a lot of drainage currently and this is somewhat ?blue/green in nature  which is consistent with Pseudomonas. I do think a culture today would be appropriate for [place] to evaluate and see if that is indeed the case ?I would likely start him on antibiotic orally as well he is not allergic to Cipro knows of no issues he has had in the past ?12/21; patient was admitted to the clinic earlier this month with bilateral presumed pressure ulcers on the bottom of his feet apparently related to excessive ?pressure from a tilt table arrangement in the intensive care unit. Patient relates this to being on ECMO but I am not really sure that is exactly related to that. I ?must say I have never seen anything like this. He has fairly extensive full-thickness wounds extending from his heel towards his midfoot mostly centered ?laterally. There is already been some healing distally. He does not appear to have an arterial issue. He has been using gentamicin to the wound surfaces with ?Iodoflex to help with ongoing debridement ?1/6; this is a patient with pressure ulcers on the bottom of his feet related to excessive pressure from a standing position in the intensive care unit. He is ?complaining of a lot of pain in the right heel. He is not a diabetic. 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 We have been using ?Iodoflex to help prepare the surfaces of both wounds for an advanced treatment product. He is nonambulatory spending most of his time in a wheelchair I ?have asked him not to propel the wheelchair with his heels ?1/13; in general his wounds look better not much surface area change we have been using Iodoflex as of last week. ?I did an x-ray of the right heel as the patient  was complaining of pain. I had some thoughts about a stress fracture perhaps Achilles tendon problems however ?what it showed was erosive changes along the inferior aspect of the calcaneus he now has a MRI booked for 1/20. ?1/20; in general his wounds continue to be better. Some improvement in the large narrow areas proximally in his foot. He is still complaining of pain in the right ?heel and tenderness in certain areas of this wound. His MRI is tonight. I am not just looking for osteomyelitis that was brought up on the x-ray I am wondering ?about stress fractures, tendon ruptures etc. He has no such findings on the left. Also noteworthy is that the patient had critical illness neuropathy and some of ?the discomfort may be actual improvement in nerve function I am just not sure. ?These wounds were initially in the setting of severe critical illness related to COVID-19. He was put in a standing position. He may have also been on pressors ?at the point contributing to tissue ischemia. By his description at some point these wounds were grossly necrotic extending proximally up into the Achilles part ?of his heel. I do not know that I have ever really seen pictures of them like this although they may exist in epic ?We have ordered Tri layer Oasis. I am trying to stimulate some granulation in these areas. This is of course assuming the MRI is negative for infection ?1/27; since the patient was last here he saw Dr. Juleen China of infectious disease. He is planned for vancomycin and ceftriaxone. Prior operative culture grew ?MSSA. Also ordered baseline lab work. ?He also ordered arterial studies although the ABIs in our clinic were normal as well as his clinical exam these were normal I do not think he needs to see ?vascular surgery. His ABIs  at the [facility] were 1.22 in the right triphasic waveforms with a normal TBI of 1.15 on the left ABI of 1.22 with triphasic waveforms ?and a normal TBI of 1.08. ?Finally he saw Dr. Amalia Hailey who  will follow him in for 2 months. At this point I do not think he felt that he needed a procedure on the right calcaneal bone. Dr. Juleen China is elected for broad-spectrum antibiotic ?The patient is still having pai

## 2021-12-10 IMAGING — DX DG CHEST 1V PORT
1 series · 1 of 1 positions shown · non-contrast
Comparison: Multiple previous chest films.

CLINICAL DATA: COVID pneumonia, ECMO.

EXAM:
PORTABLE CHEST 1 VIEW

[chest ap]
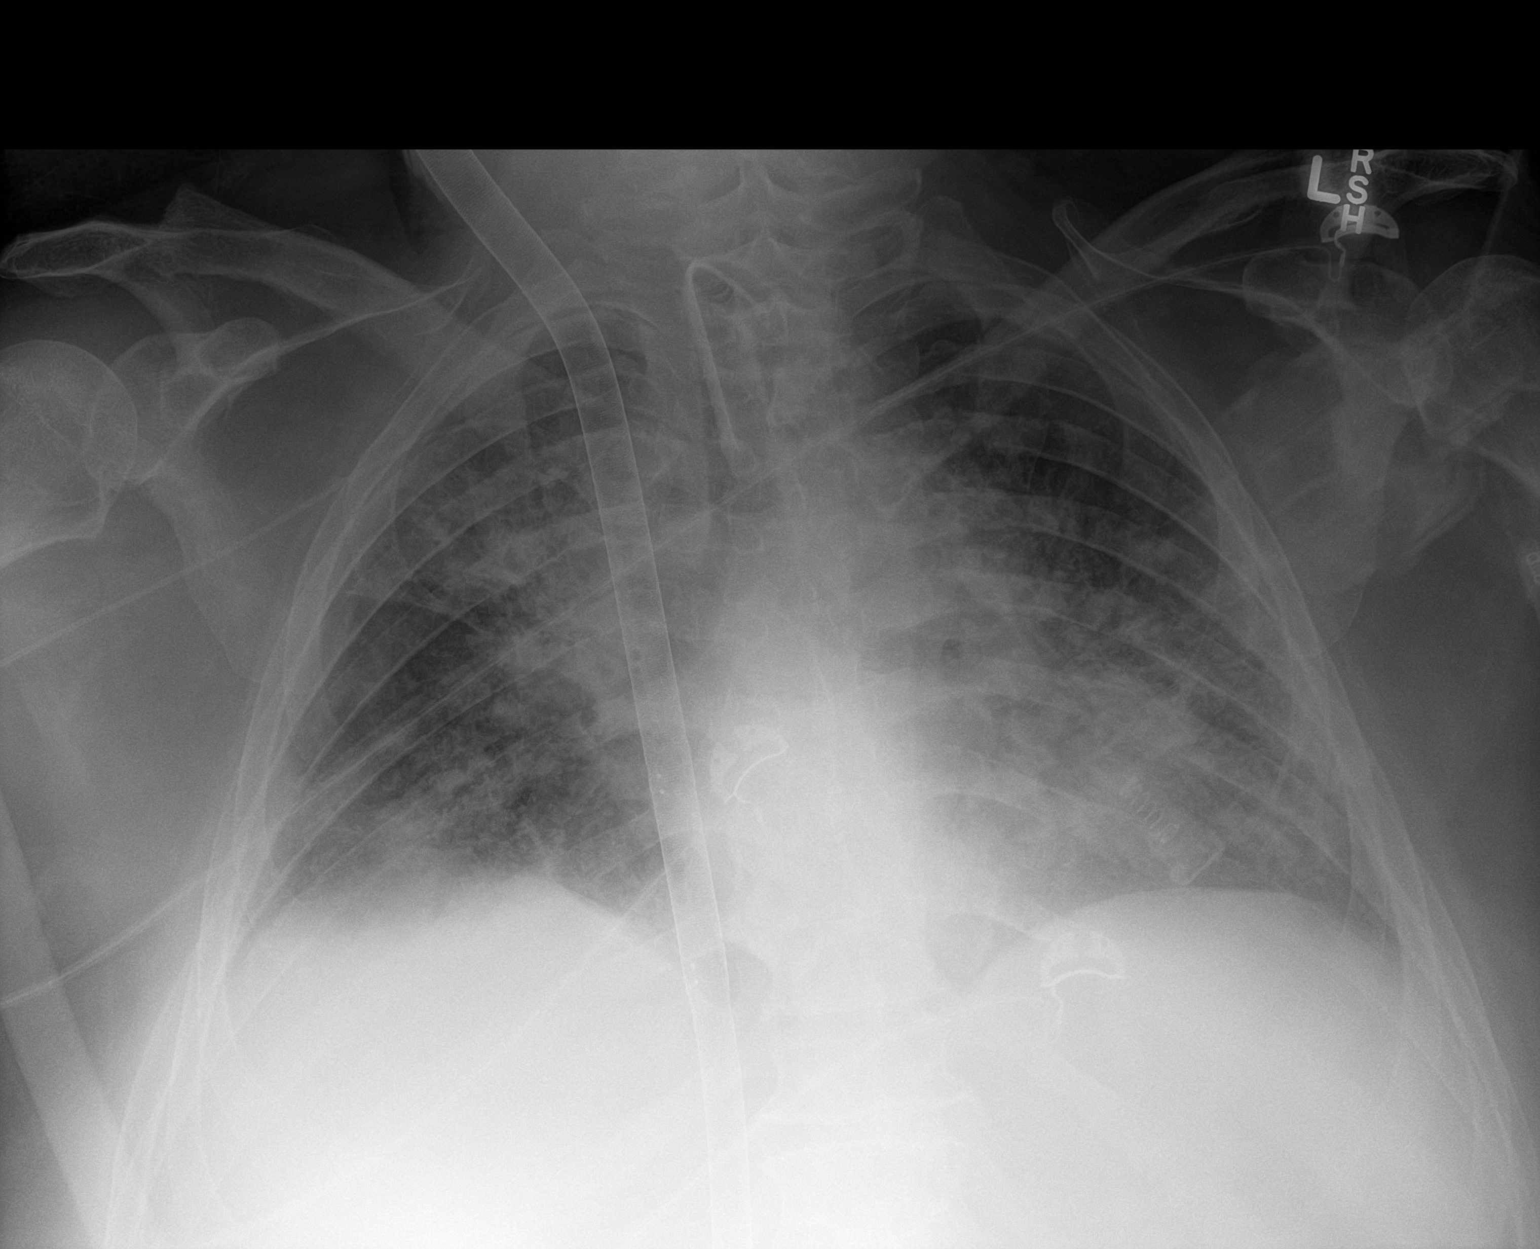

[1 of 1 positions shown; findings below may reference images not displayed]

FINDINGS: The tracheostomy tube is stable. Persistent patchy coarse bilateral
infiltrates but slight improved aeration when compared to the 2 most
previous chest films. No pleural effusions. No pneumothorax.
IMPRESSION: Persistent bilateral coarse infiltrates but slight improved lung
aeration.

## 2021-12-12 IMAGING — DX DG CHEST 1V PORT
1 series · 1 of 1 positions shown · non-contrast
Comparison: October 01, 2019.

CLINICAL DATA: Tracheostomy.

EXAM:
PORTABLE CHEST 1 VIEW

[chest ap]
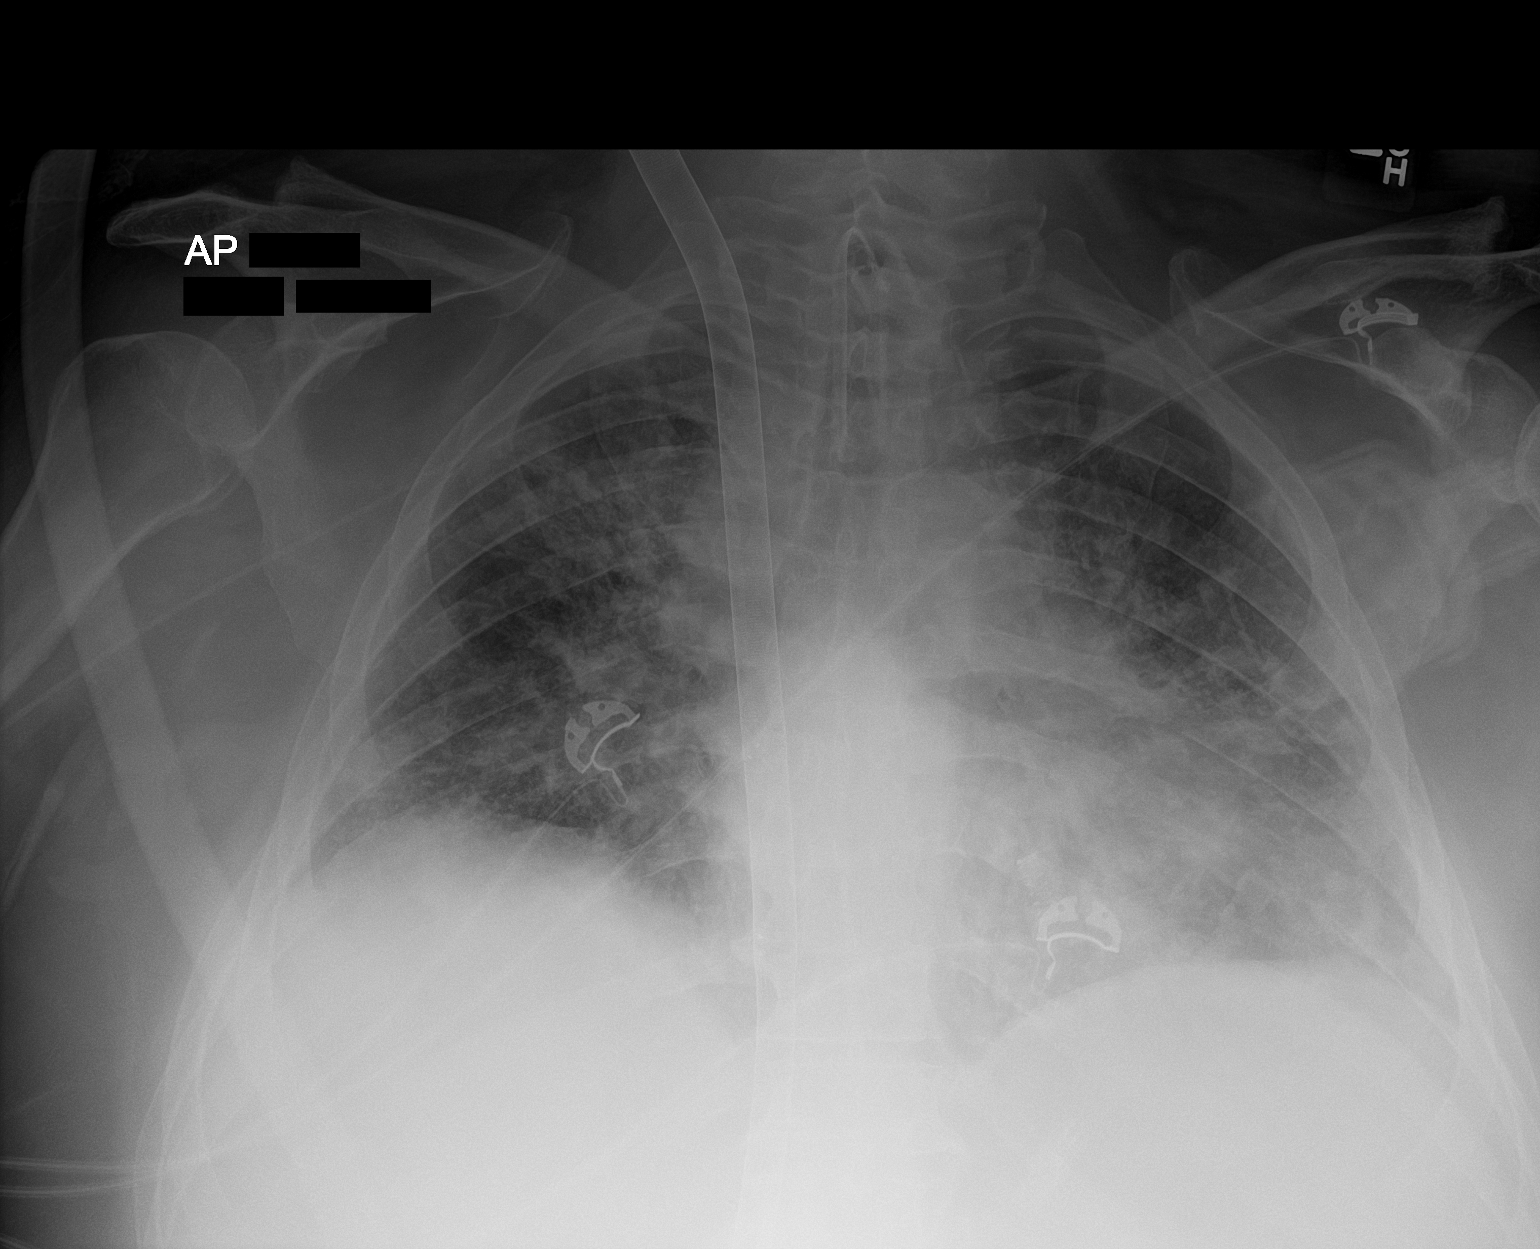

[1 of 1 positions shown; findings below may reference images not displayed]

FINDINGS: Stable cardiomegaly. Tracheostomy tube is in good position. Stable
bilateral lung opacities are noted consistent with multifocal
pneumonia. Bony thorax is unremarkable.
IMPRESSION: Stable bilateral lung opacities consistent with multifocal
pneumonia.

## 2021-12-13 IMAGING — DX DG CHEST 1V PORT
1 series · 1 of 1 positions shown · non-contrast
Comparison: Portable exam 4149 hours compared to 10/02/2019

CLINICAL DATA: Personal history of ECMO, history hypertension,
kidney stones, GERD, asthma, pancreatitis

EXAM:
PORTABLE CHEST 1 VIEW

[chest ap]
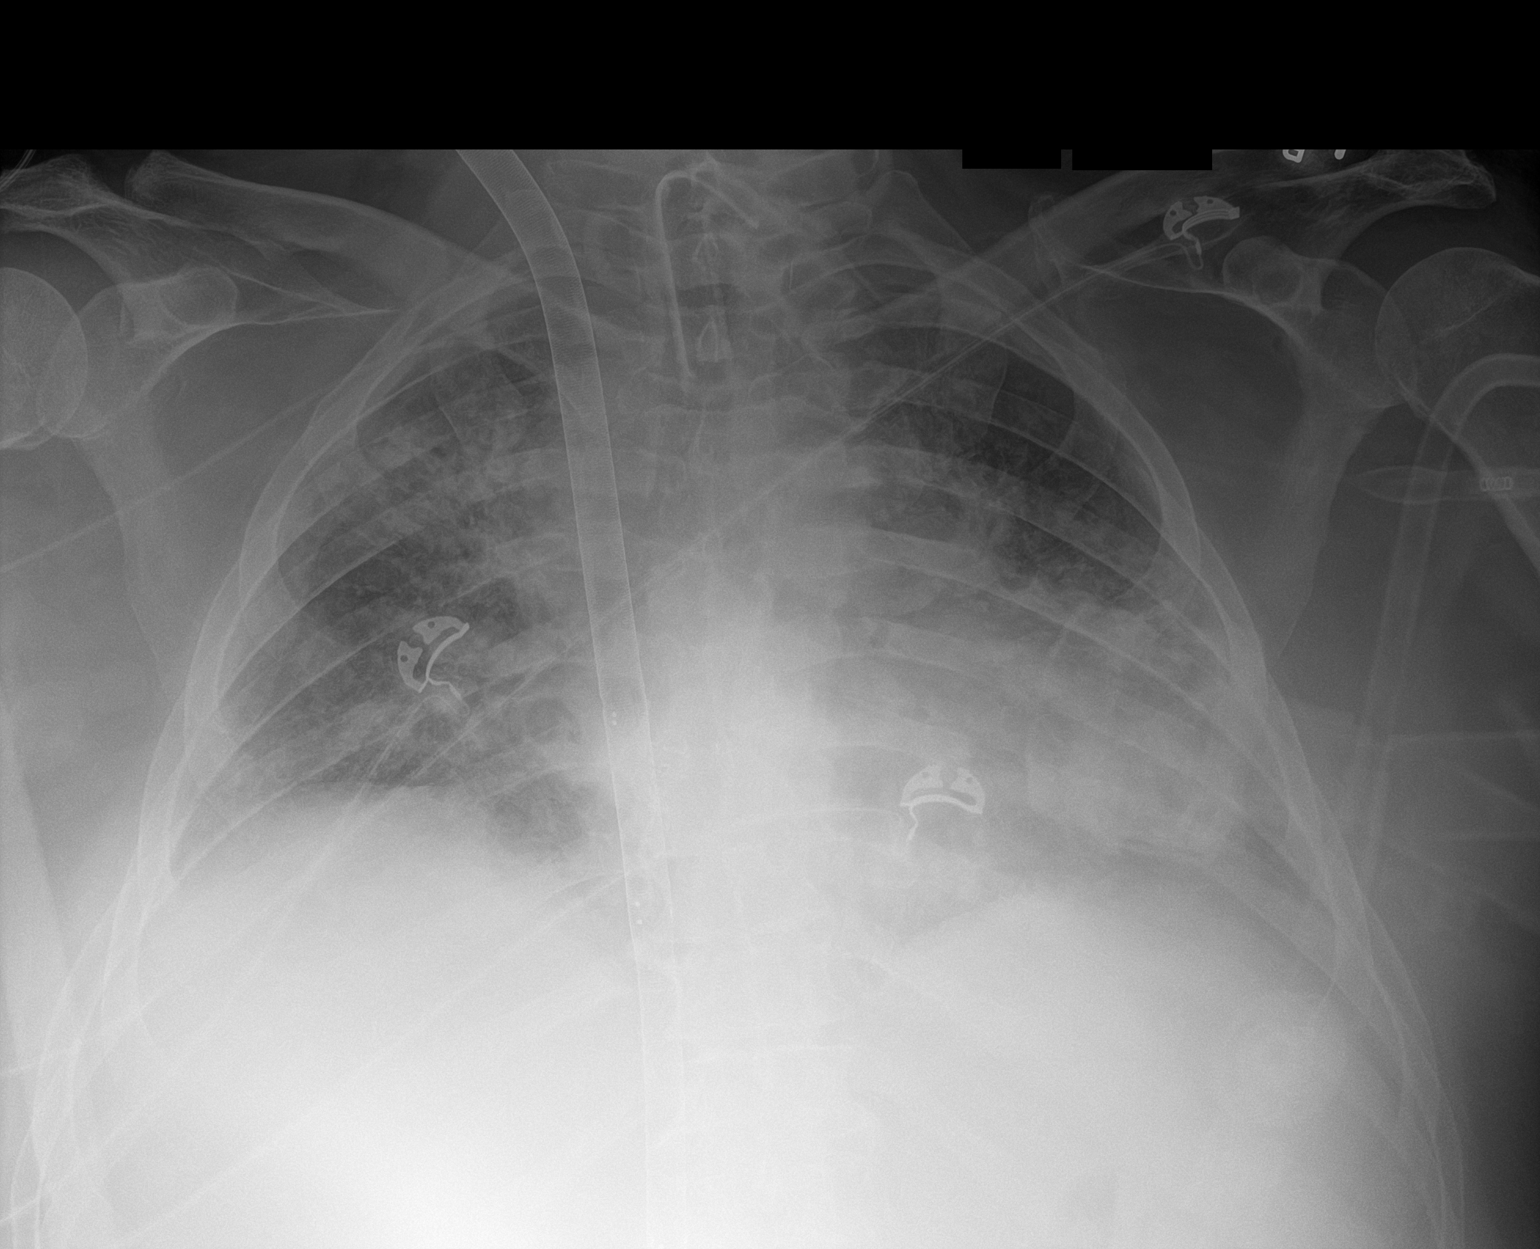

[1 of 1 positions shown; findings below may reference images not displayed]

FINDINGS: Tracheostomy tube and ECMO catheter unchanged.

Stable heart size.

Patchy airspace infiltrates bilaterally consistent with multifocal
pneumonia.

No pleural effusion or pneumothorax.
IMPRESSION: Persistent pulmonary infiltrates consistent with multifocal
pneumonia.

## 2021-12-14 IMAGING — DX DG CHEST 1V PORT
1 series · 1 of 1 positions shown · non-contrast
Comparison: 10/03/2019.

CLINICAL DATA: Tracheostomy.  ECMO.

EXAM:
PORTABLE CHEST 1 VIEW

[chest]
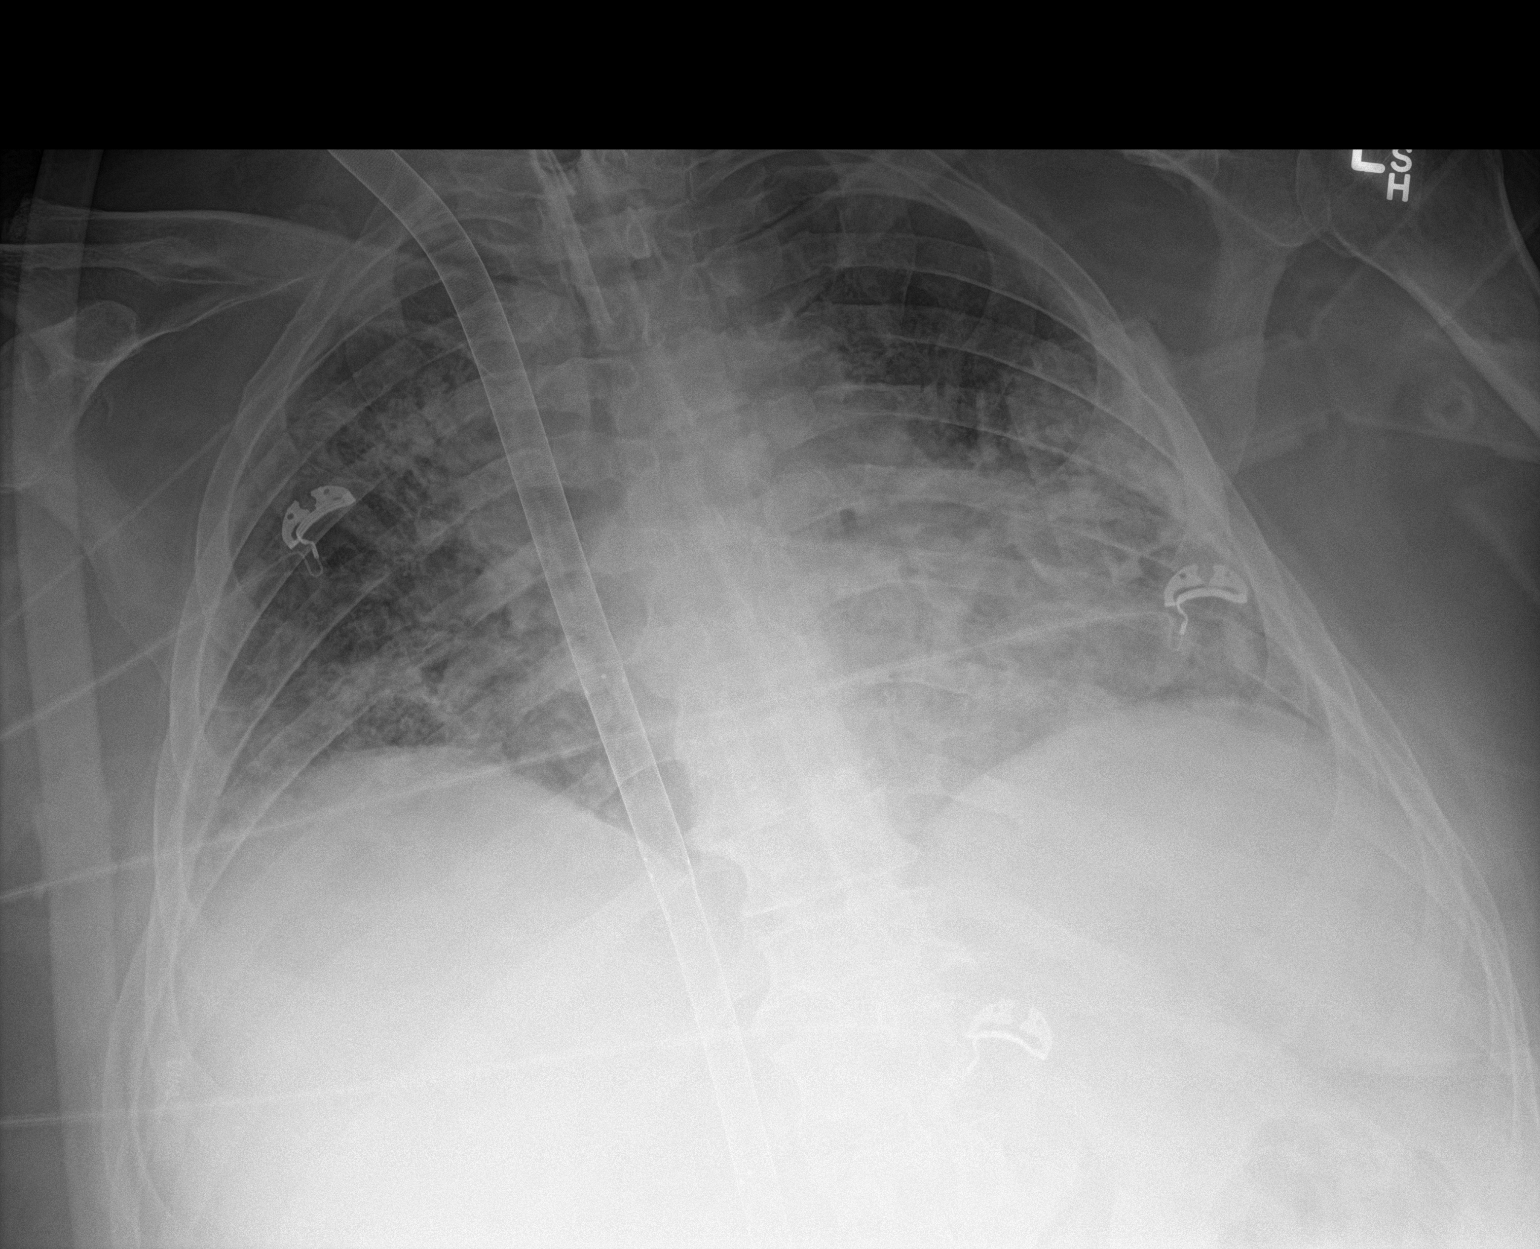

[1 of 1 positions shown; findings below may reference images not displayed]

FINDINGS: Tracheostomy tube and ECMO catheter in stable position. Heart size
stable. Diffuse bilateral pulmonary infiltrates again noted without
interim change. No pleural effusion or pneumothorax.
IMPRESSION: 1.  Tracheostomy tube and ECMO catheter stable position.

2. Diffuse bilateral pulmonary infiltrates again noted without
interim change.

## 2021-12-15 IMAGING — DX DG CHEST 1V PORT
1 series · 1 of 1 positions shown · non-contrast
Comparison: 10/04/2019

CLINICAL DATA: ECMO.

EXAM:
PORTABLE CHEST 1 VIEW

[chest]
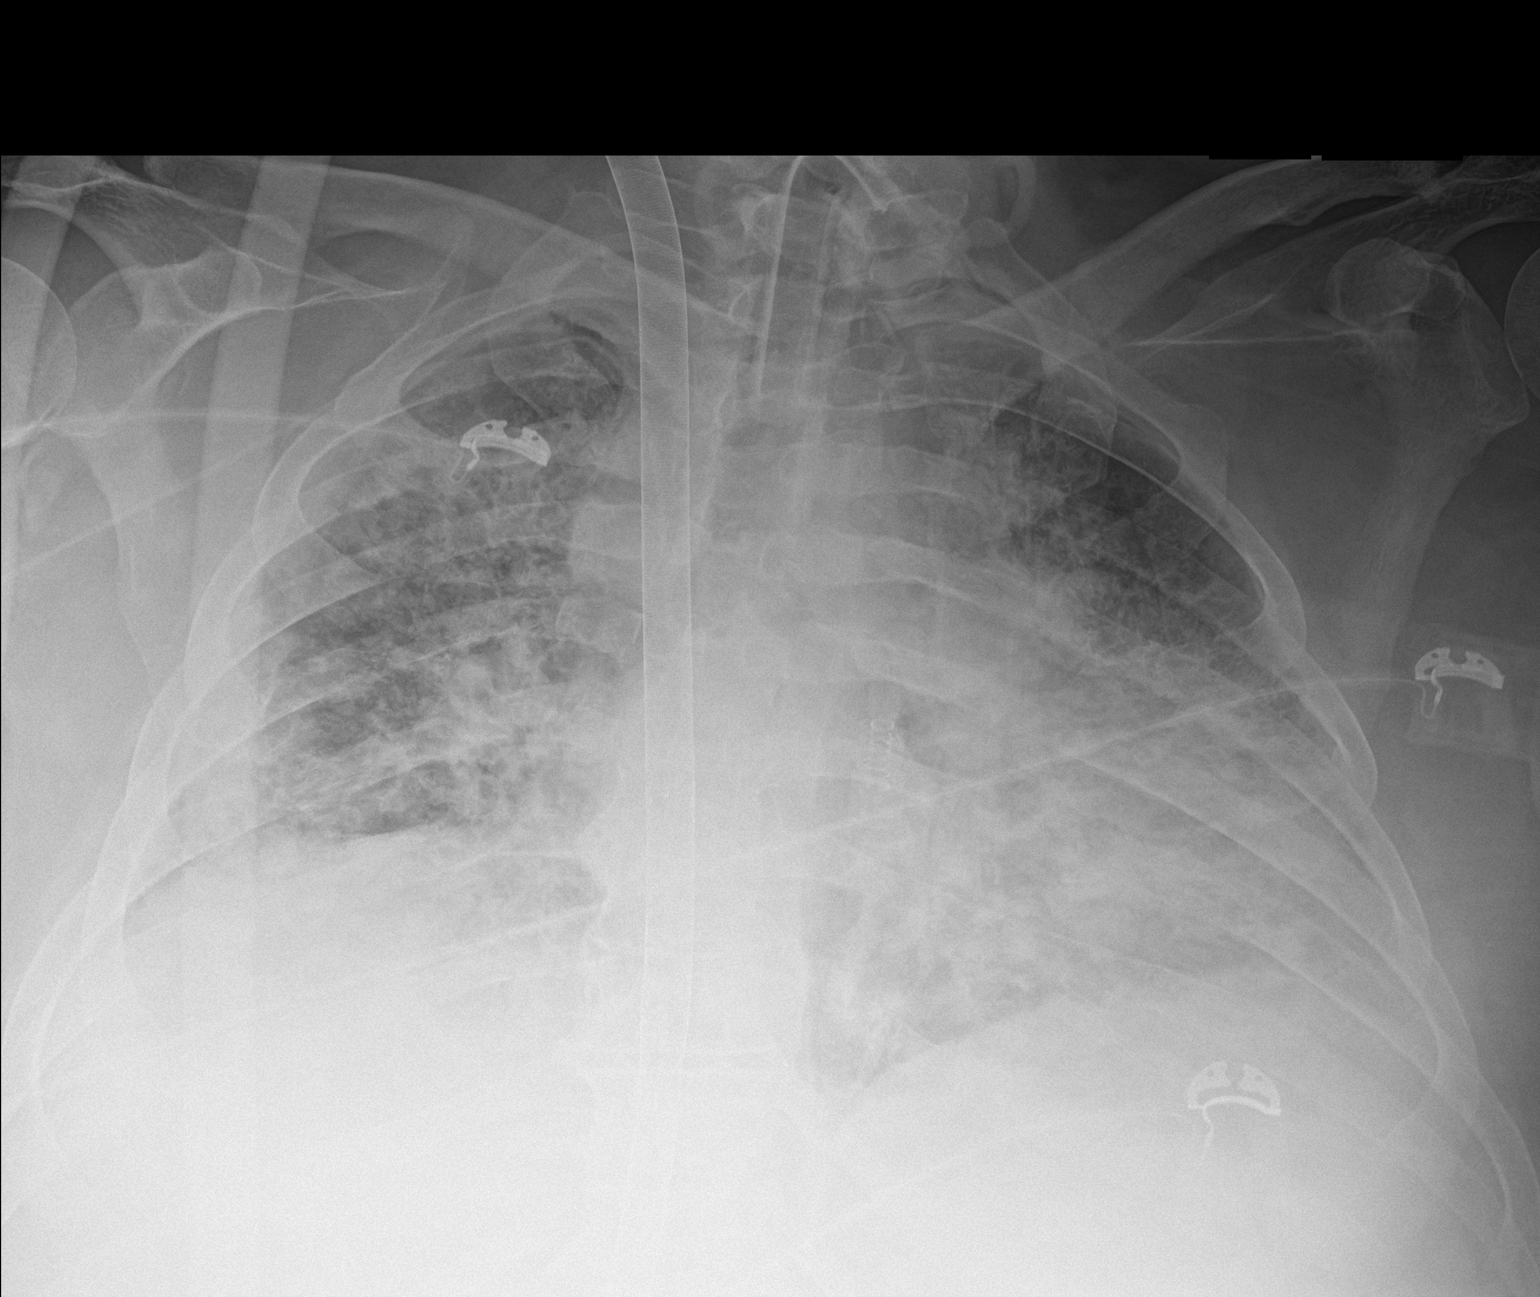

[1 of 1 positions shown; findings below may reference images not displayed]

FINDINGS: 0082 hours. Tracheostomy tube again noted. ECMO cannula remains in
place. Cardiopericardial silhouette is at upper limits of normal for
size. Diffuse bilateral airspace disease is not substantially
changed in the interval. The visualized bony structures of the
thorax are intact. Telemetry leads overlie the chest.
IMPRESSION: Stable exam.  No new or progressive interval findings.

## 2021-12-16 ENCOUNTER — Encounter (HOSPITAL_BASED_OUTPATIENT_CLINIC_OR_DEPARTMENT_OTHER): Payer: BC Managed Care – PPO | Admitting: General Surgery

## 2021-12-16 DIAGNOSIS — L97512 Non-pressure chronic ulcer of other part of right foot with fat layer exposed: Secondary | ICD-10-CM | POA: Diagnosis not present

## 2021-12-16 DIAGNOSIS — Z8616 Personal history of COVID-19: Secondary | ICD-10-CM | POA: Diagnosis not present

## 2021-12-16 DIAGNOSIS — L97522 Non-pressure chronic ulcer of other part of left foot with fat layer exposed: Secondary | ICD-10-CM | POA: Diagnosis not present

## 2021-12-16 IMAGING — DX DG CHEST 1V PORT
1 series · 1 of 1 positions shown · non-contrast
Comparison: 10/05/2019

CLINICAL DATA: ECMO

EXAM:
PORTABLE CHEST 1 VIEW

[chest ap]
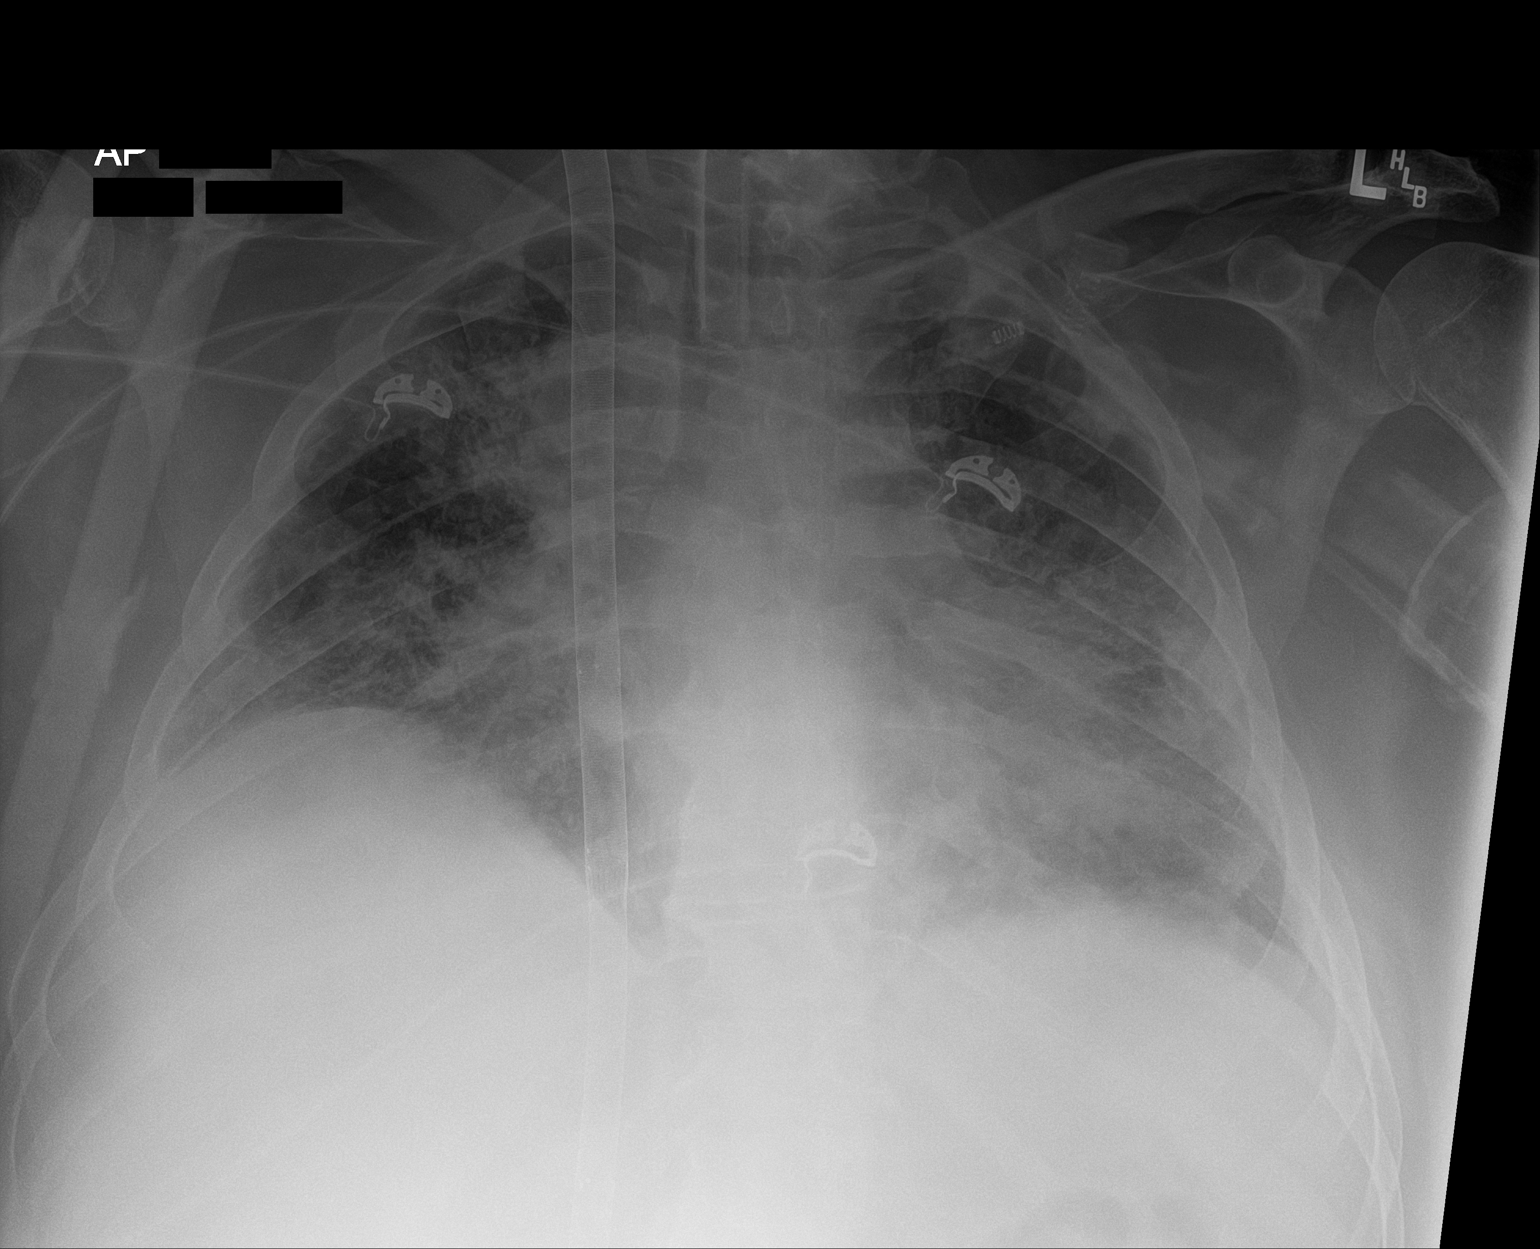

[1 of 1 positions shown; findings below may reference images not displayed]

FINDINGS: Tracheostomy tube again noted. ECMO cannula is similar.
Cardiopericardial silhouette is enlarged. Diffuse patchy bilateral
airspace disease shows no substantial interval change. No pleural
effusion. The visualized bony structures of the thorax are intact.
Telemetry leads overlie the chest.
IMPRESSION: Stable exam.

## 2021-12-16 NOTE — Progress Notes (Signed)
VILAS, EDGERLY ([id number]) ?Visit Report for 12/16/2021 ?Chief Complaint Document Details ?Patient Name: Date of Service: ?[name], [name] 12/16/2021 9:30 A M ?Medical Record Number: [id number] ?Patient Account Number: [id number] ?Date of Birth/Sex: Treating RN: ?1976-06-05 (46 y.o. Male) Minus Liberty, Mechele Claude ?Primary Care Provider: [name] Other Clinician: ?Referring Provider: ?Treating Provider/Extender: Fredirick Maudlin ?[name] ?Weeks in Treatment: 20 ?Information Obtained from: Patient ?Chief Complaint ?Bilateral Plantar Foot Ulcers ?Electronic Signature(s) ?Signed: 12/16/2021 10:18:00 AM By: Fredirick Maudlin MD FACS ?Entered By: Fredirick Maudlin on 12/16/2021 10:18:00 ?-------------------------------------------------------------------------------- ?Cellular or Tissue Based Product Details ?Patient Name: Date of Service: ?[name], [name] 12/16/2021 9:30 A M ?Medical Record Number: [id number] ?Patient Account Number: [id number] ?Date of Birth/Sex: Treating RN: ?[birth year] (46 y.o. Male) Minus Liberty, Mechele Claude ?Primary Care Provider: [name] Other Clinician: ?Referring Provider: ?Treating Provider/Extender: Fredirick Maudlin ?[name] ?Weeks in Treatment: 26 ?Cellular or Tissue Based Product Type Wound #2 Left Calcaneus ?Applied to: ?Performed By: Physician Fredirick Maudlin, MD ?Cellular or Tissue Based Product Type: Other ?Level of Consciousness (Pre-procedure): Awake and Alert ?Pre-procedure Verification/Time Out Yes - 10:05 ?Taken: ?Location: genitalia / hands / feet / multiple digits ?Wound Size (sq cm): 1.75 ?Product Size (sq cm): 4 ?Waste Size (sq cm): 0 ?Amount of Product Applied ?(sq cm): 4 ?Instrument Used: Forceps ?Lot #: [id number] ?Order #: 1 ?Expiration Date: 07/23/2024 ?Fenestrated: No ?Reconstituted: No ?Secured: Yes ?Secured With: Steri-Strips ?Dressing Applied: Yes ?Primary Dressing: Adaptic ?Procedural Pain: 0 ?Post Procedural Pain: 0 ?Response to Treatment: Procedure was tolerated  well ?Level of Consciousness (Post- Awake and Alert ?procedure): ?Post Procedure Diagnosis ?Same as Pre-procedure ?Notes ?DONATED Skin Product Vendaje #1 was used for trial. *****DO NOT CHARGE PATIENT**** ?Electronic Signature(s) ?Signed: 12/16/2021 4:19:58 PM By: Fredirick Maudlin MD FACS ?Signed: 12/16/2021 5:32:45 PM By: [name] ?Previous Signature: 12/16/2021 11:38:07 AM Version By: Fredirick Maudlin MD FACS ?Entered By: [name] on 12/16/2021 16:19:32 ?-------------------------------------------------------------------------------- ?Debridement Details ?Patient Name: ?Date of Service: ?[name], [name] 12/16/2021 9:30 A M ?Medical Record Number: [id number] ?Patient Account Number: [id number] ?Date of Birth/Sex: ?Treating RN: ?July 26, 1976 (45 y.o. Male) Minus Liberty, Mechele Claude ?Primary Care Provider: [name] ?Other Clinician: ?Referring Provider: ?Treating Provider/Extender: Fredirick Maudlin ?[name] ?Weeks in Treatment: 69 ?Debridement Performed for Assessment: Wound #1 Right Calcaneus ?Performed By: Physician Fredirick Maudlin, MD ?Debridement Type: Debridement ?Level of Consciousness (Pre-procedure): Awake and Alert ?Pre-procedure Verification/Time Out Yes - 10:05 ?Taken: ?Start Time: 10:05 ?Pain Control: Lidocaine 5% topical ointment ?T Area Debrided (L x W): ?otal 1.2 (cm) x 0.4 (cm) = 0.48 (cm?) ?Tissue and other material debrided: Non-Viable, Callus, Slough, Subcutaneous, Slough ?Level: Skin/Subcutaneous Tissue ?Debridement Description: Excisional ?Instrument: Curette, Forceps ?Bleeding: Minimum ?Hemostasis Achieved: Pressure ?End Time: 10:07 ?Procedural Pain: 0 ?Post Procedural Pain: 0 ?Response to Treatment: Procedure was tolerated well ?Level of Consciousness (Post- Awake and Alert ?procedure): ?Post Debridement Measurements of Total Wound ?Length: (cm) 1.2 ?Stage: Category/Stage III ?Width: (cm) 0.4 ?Depth: (cm) 0.3 ?Volume: (cm?) 0.113 ?Character of Wound/Ulcer Post Debridement:  Improved ?Post Procedure Diagnosis ?Same as Pre-procedure ?Electronic Signature(s) ?Signed: 12/16/2021 11:16:17 AM By: Fredirick Maudlin MD FACS ?Signed: 12/16/2021 5:32:45 PM By: [name] ?Entered By: [name] on 12/16/2021 10:13:22 ?-------------------------------------------------------------------------------- ?Debridement Details ?Patient Name: Date of Service: ?[name], [name] 12/16/2021 9:30 A M ?Medical Record Number: [id number] ?Patient Account Number: [id number] ?Date of Birth/Sex: Treating RN: ?11-26-1975 (46 y.o. Male) Minus Liberty, Mechele Claude ?Primary Care Provider: [name] Other Clinician: ?Referring Provider: ?Treating Provider/Extender: Fredirick Maudlin ?[name] ?Weeks in  Treatment: 74 ?Debridement Performed for Assessment: Wound #2 Left Calcaneus ?Performed By: Physician Fredirick Maudlin, MD ?Debridement Type: Debridement ?Level of Consciousness (Pre-procedure): Awake and Alert ?Pre-procedure Verification/Time Out Yes - 10:05 ?Taken: ?Start Time: 10:05 ?Pain Control: Lidocaine 5% topical ointment ?T Area Debrided (L x W): ?otal 2.5 (cm) x 0.7 (cm) = 1.75 (cm?) ?Tissue and other material debrided: Non-Viable, Callus, Slough, Subcutaneous, Slough ?Level: Skin/Subcutaneous Tissue ?Debridement Description: Excisional ?Instrument: Curette, Forceps ?Bleeding: Minimum ?Hemostasis Achieved: Pressure ?End Time: 10:07 ?Procedural Pain: 0 ?Post Procedural Pain: 0 ?Response to Treatment: Procedure was tolerated well ?Level of Consciousness (Post- Awake and Alert ?procedure): ?Post Debridement Measurements of Total Wound ?Length: (cm) 2.5 ?Stage: Category/Stage III ?Width: (cm) 0.7 ?Depth: (cm) 0.2 ?Volume: (cm?) 0.275 ?Character of Wound/Ulcer Post Debridement: Improved ?Post Procedure Diagnosis ?Same as Pre-procedure ?Electronic Signature(s) ?Signed: 12/16/2021 11:16:17 AM By: Fredirick Maudlin MD FACS ?Signed: 12/16/2021 5:32:45 PM By: [name] ?Entered By: [name] on 12/16/2021  10:14:23 ?-------------------------------------------------------------------------------- ?HPI Details ?Patient Name: Date of Service: ?[name], [name] 12/16/2021 9:30 A M ?Medical Record Number: [id number] ?Patient Account Number: [id number] ?Date of Birth/Sex: Treating RN: ?October 29, 1975 (46 y.o. Male) Minus Liberty, Mechele Claude ?Primary Care Provider: [name] Other Clinician: ?Referring Provider: ?Treating Provider/Extender: Fredirick Maudlin ?[name] ?Weeks in Treatment: 84 ?History of Present Illness ?HPI Description: Wounds are12/03/2020 upon evaluation today patient presents for initial inspection here in our clinic concerning issues he has been having ?with the bottoms of his feet bilaterally. He states these actually occurred as wounds when he was hospitalized for 5 months secondary to Covid. He was ?apparently with tilting bed where he was in an upright position quite frequently and apparently this occurred in some way shape or form during that time. ?Fortunately there is no sign of active infection at this time. No fevers, chills, nausea, vomiting, or diarrhea. With that being said he still has substantial wounds ?on the plantar aspects of his feet Theragen require quite a bit of work to get these to heal. He has been using Santyl currently though that is been problematic ?both in receiving the medication as well as actually paid for it as it is become quite expensive. Prior to the experience with Covid the patient really did not have ?any major medical problems other than hypertension he does have some mild generalized weakness following the Covid experience. ?07/22/2020 on evaluation today patient appears to be doing okay in regard to his foot ulcers I feel like the wound beds are showing signs of better ?improvement that I do believe the Iodoflex is helping in this regard. With that being said he does have a lot of drainage currently and this is somewhat ?blue/green in nature which is consistent with  Pseudomonas. I do think a culture today would be appropriate for [place] to evaluate and see if that is indeed the case ?I would likely start him on antibiotic orally as well he is not allergic to Cipro knows of no issues he has had in t

## 2021-12-17 IMAGING — DX DG CHEST 1V PORT
1 series · 1 of 1 positions shown · non-contrast
Comparison: October 06, 2019

CLINICAL DATA: Tracheostomy, COVID

EXAM:
PORTABLE CHEST 1 VIEW

[chest ap]
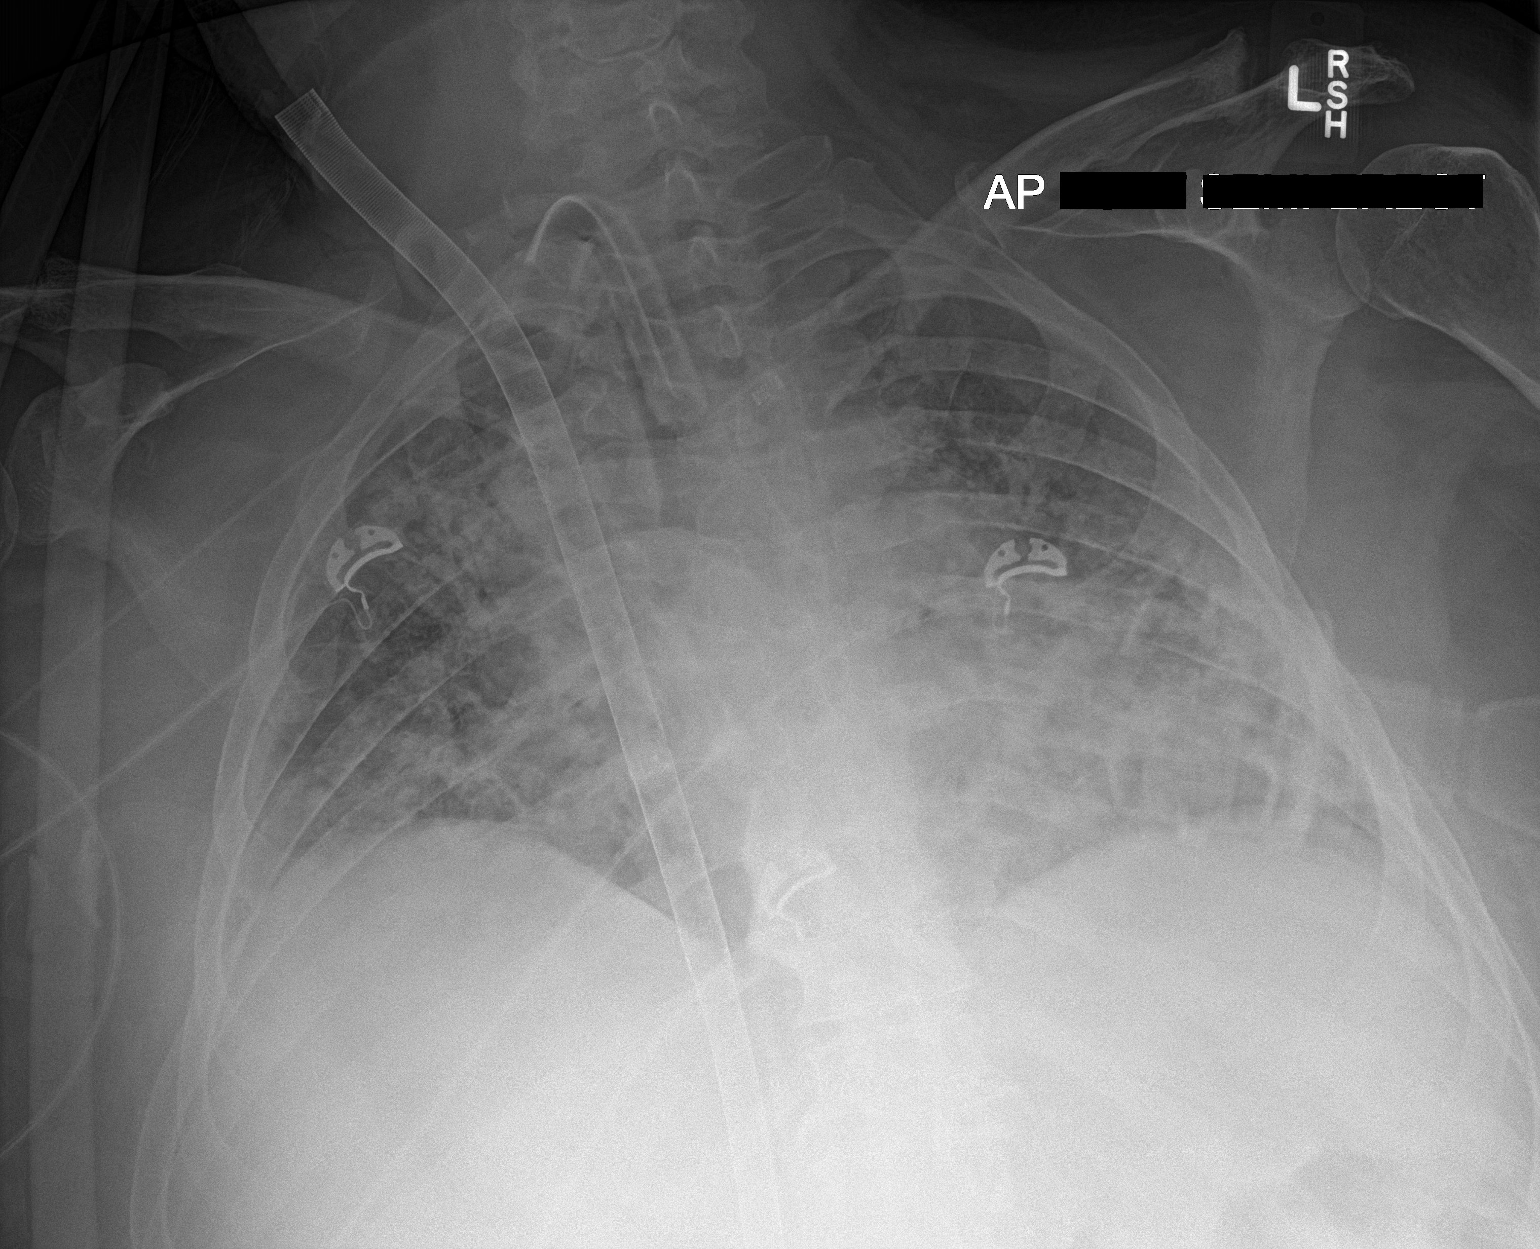

[1 of 1 positions shown; findings below may reference images not displayed]

FINDINGS: The heart size and mediastinal contours are unchanged with mild
cardiomegaly. Tracheostomy at the level of the clavicular heads.
Again noted are multifocal patchy airspace opacities throughout both
lungs, however there is slight interval improvement in degree of
aeration. No pleural effusion. No acute osseous abnormality.
IMPRESSION: Slight interval improvement in multifocal airspace opacities.

## 2021-12-18 IMAGING — DX DG CHEST 1V
1 series · 1 of 1 positions shown · non-contrast
Comparison: October 07, 2019

CLINICAL DATA: Tracheostomy, COVID pneumonia

EXAM:
CHEST  1 VIEW

[chest ap]
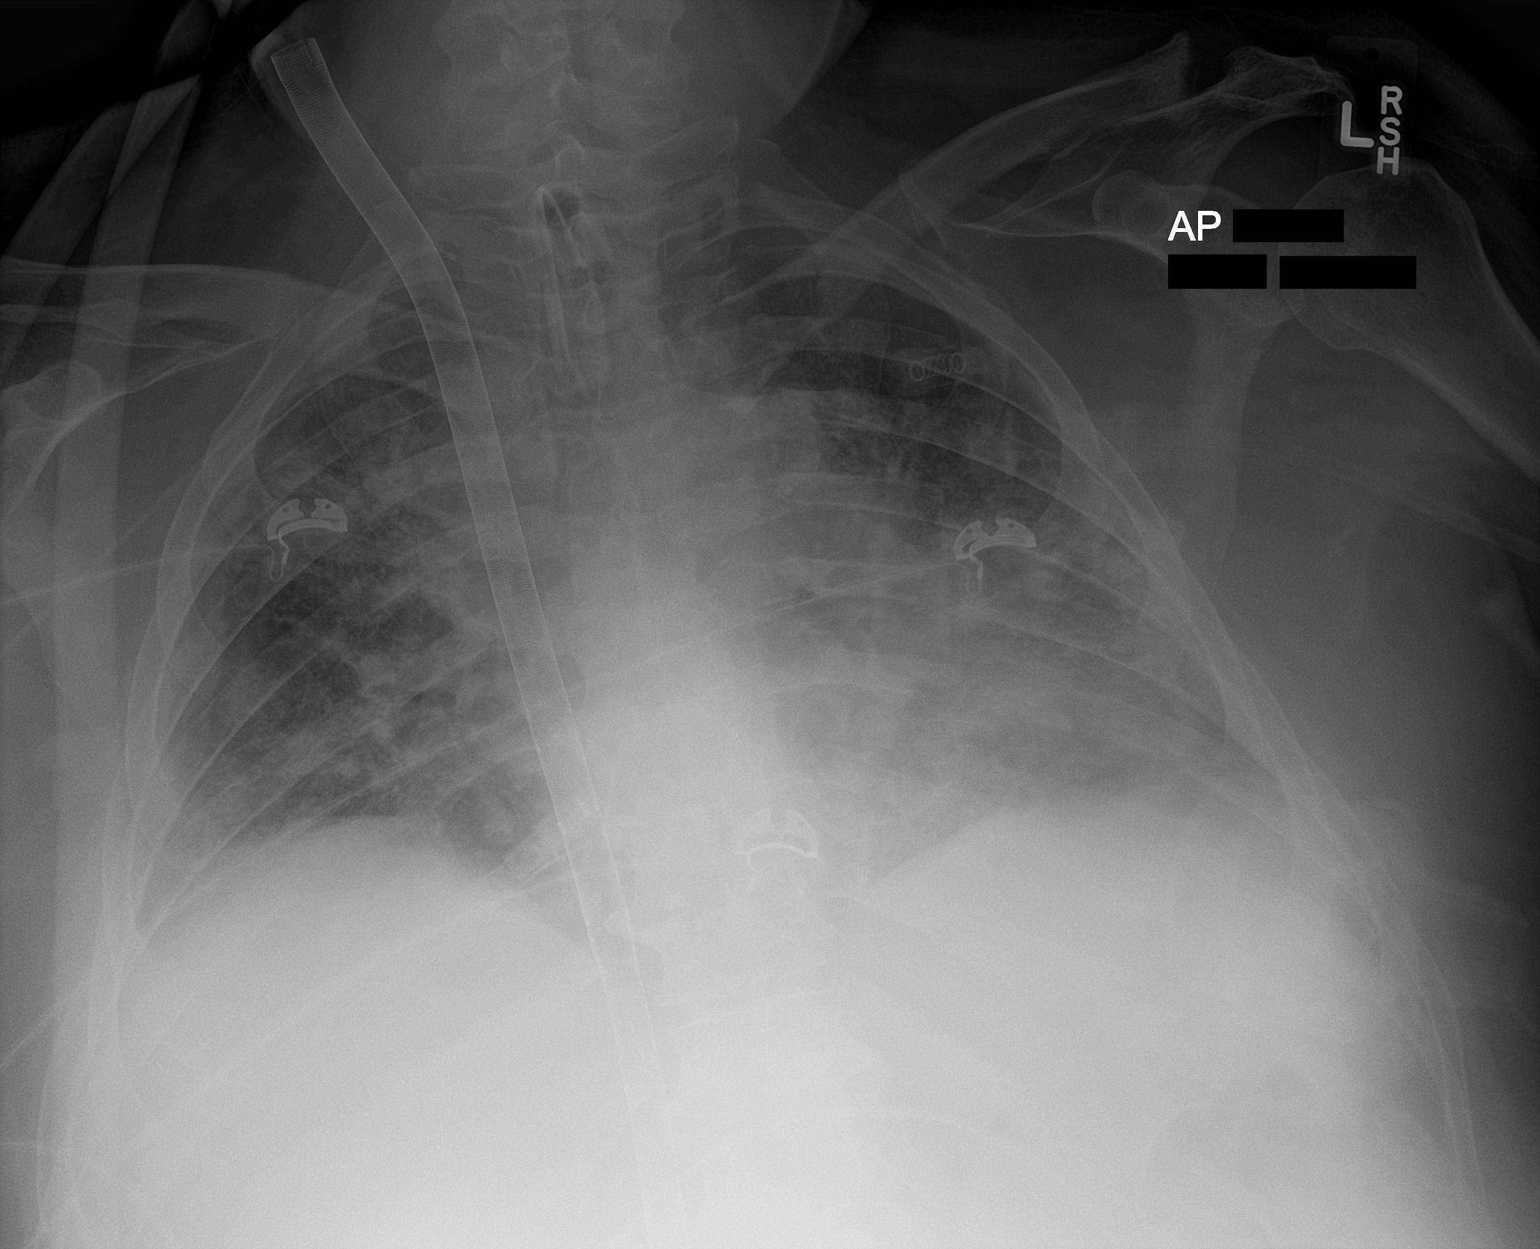

[1 of 1 positions shown; findings below may reference images not displayed]

FINDINGS: The heart size and mediastinal contours are unchanged. Tracheostomy
tube seen with the tip at the clavicular heads. Slight interval
improvement in the multifocal patchy airspace opacities throughout
both lungs. No pleural effusion. No acute osseous abnormality.
IMPRESSION: Slight interval improvement in the multifocal airspace opacities.

## 2021-12-19 IMAGING — DX DG CHEST 1V
1 series · 1 of 1 positions shown · non-contrast
Comparison: 10/08/2019

CLINICAL DATA: Trach, on ECMO

EXAM:
CHEST  1 VIEW

[chest pa]
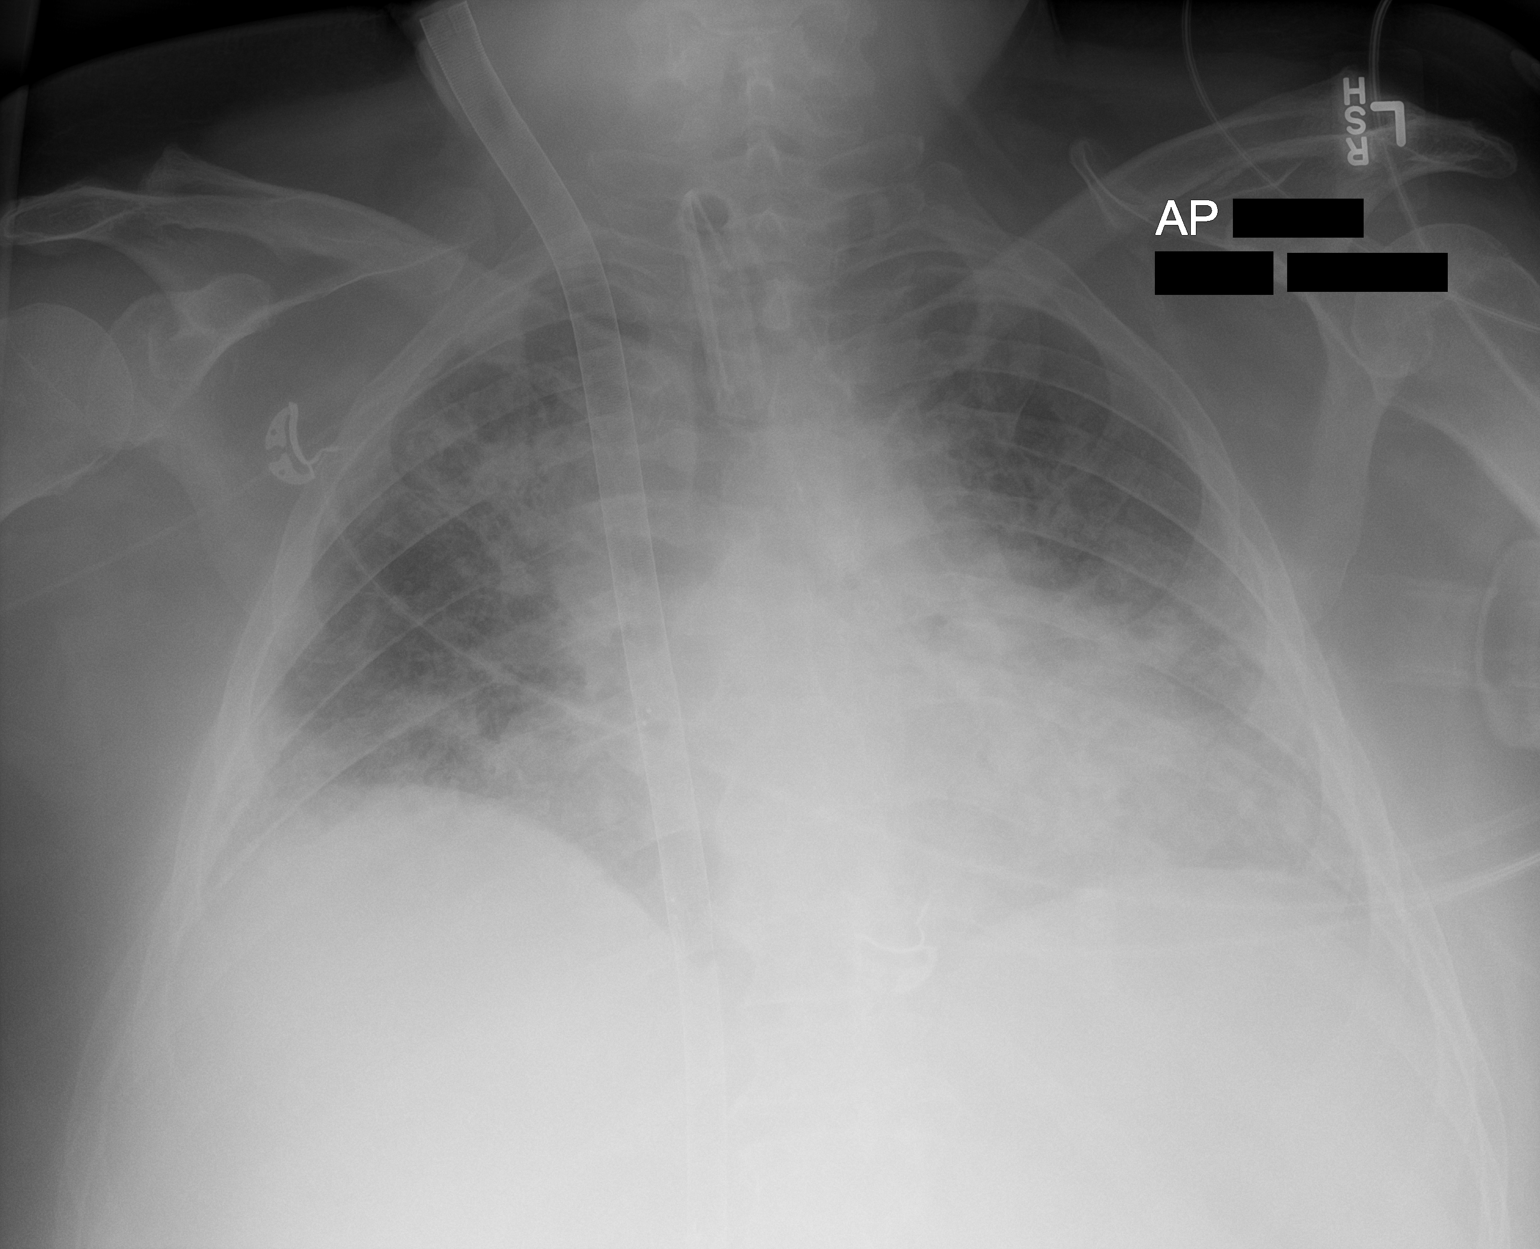

[1 of 1 positions shown; findings below may reference images not displayed]

FINDINGS: Support devices are unchanged. Low lung volumes. Patchy bilateral
airspace disease again noted, unchanged. Cardiomegaly. No effusions
or no acute bony abnormality.
IMPRESSION: Low lung volumes with patchy bilateral airspace disease, stable.

Cardiomegaly.

## 2021-12-20 IMAGING — DX DG CHEST 1V PORT
1 series · 1 of 1 positions shown · non-contrast
Comparison: 10/09/2019

CLINICAL DATA: Tracheostomy, ECMO, respiratory failure

EXAM:
PORTABLE CHEST 1 VIEW

[chest ap]
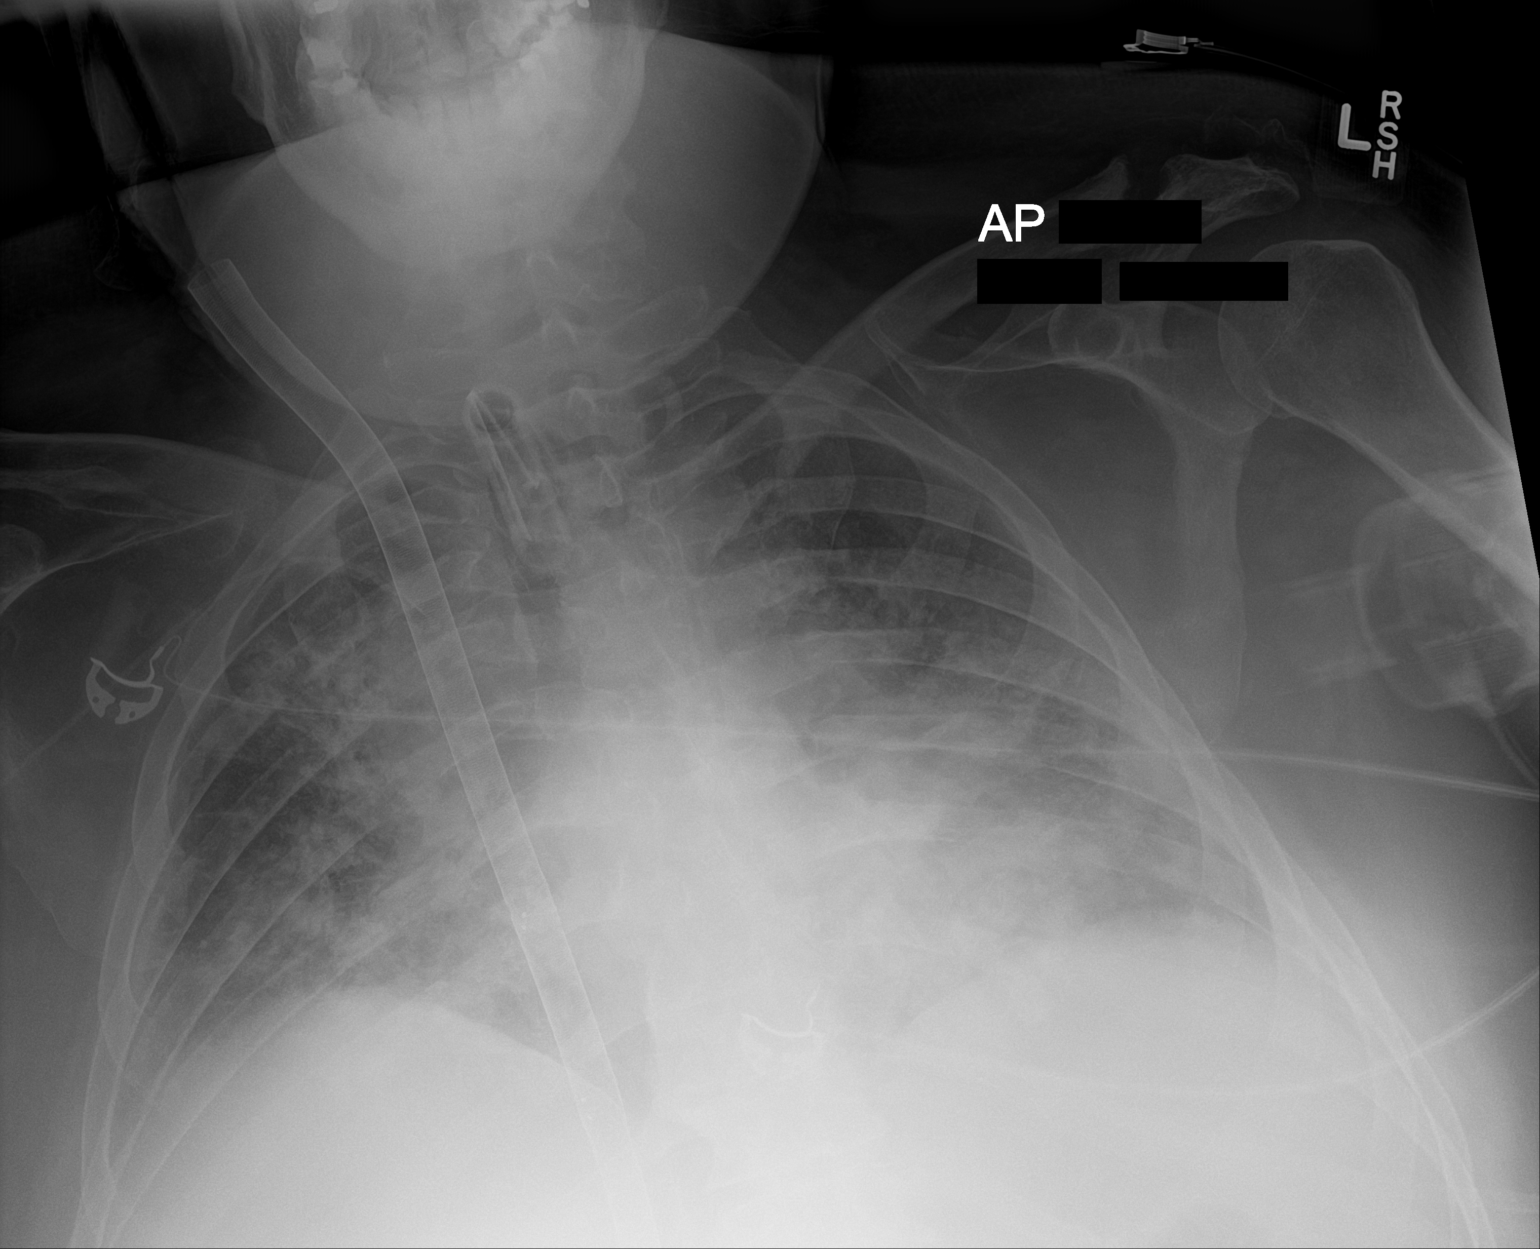

[1 of 1 positions shown; findings below may reference images not displayed]

FINDINGS: No significant change in AP portable examination with bilateral
heterogeneous and interstitial opacity, tracheostomy, cardiomegaly,
and ECMO cannula.
IMPRESSION: No significant change in AP portable examination with bilateral
heterogeneous and interstitial opacity in keeping with multifocal
infection, edema, and/or ARDS, with support apparatus including ECMO
cannula.

## 2021-12-20 IMAGING — DX DG ABDOMEN 1V
1 series · 1 of 1 positions shown · non-contrast
Comparison: Abdominal radiograph dated 08/29/2019.

CLINICAL DATA: [age] year male with PEG tube placement.

EXAM:
ABDOMEN - 1 VIEW

[abdomen kub]
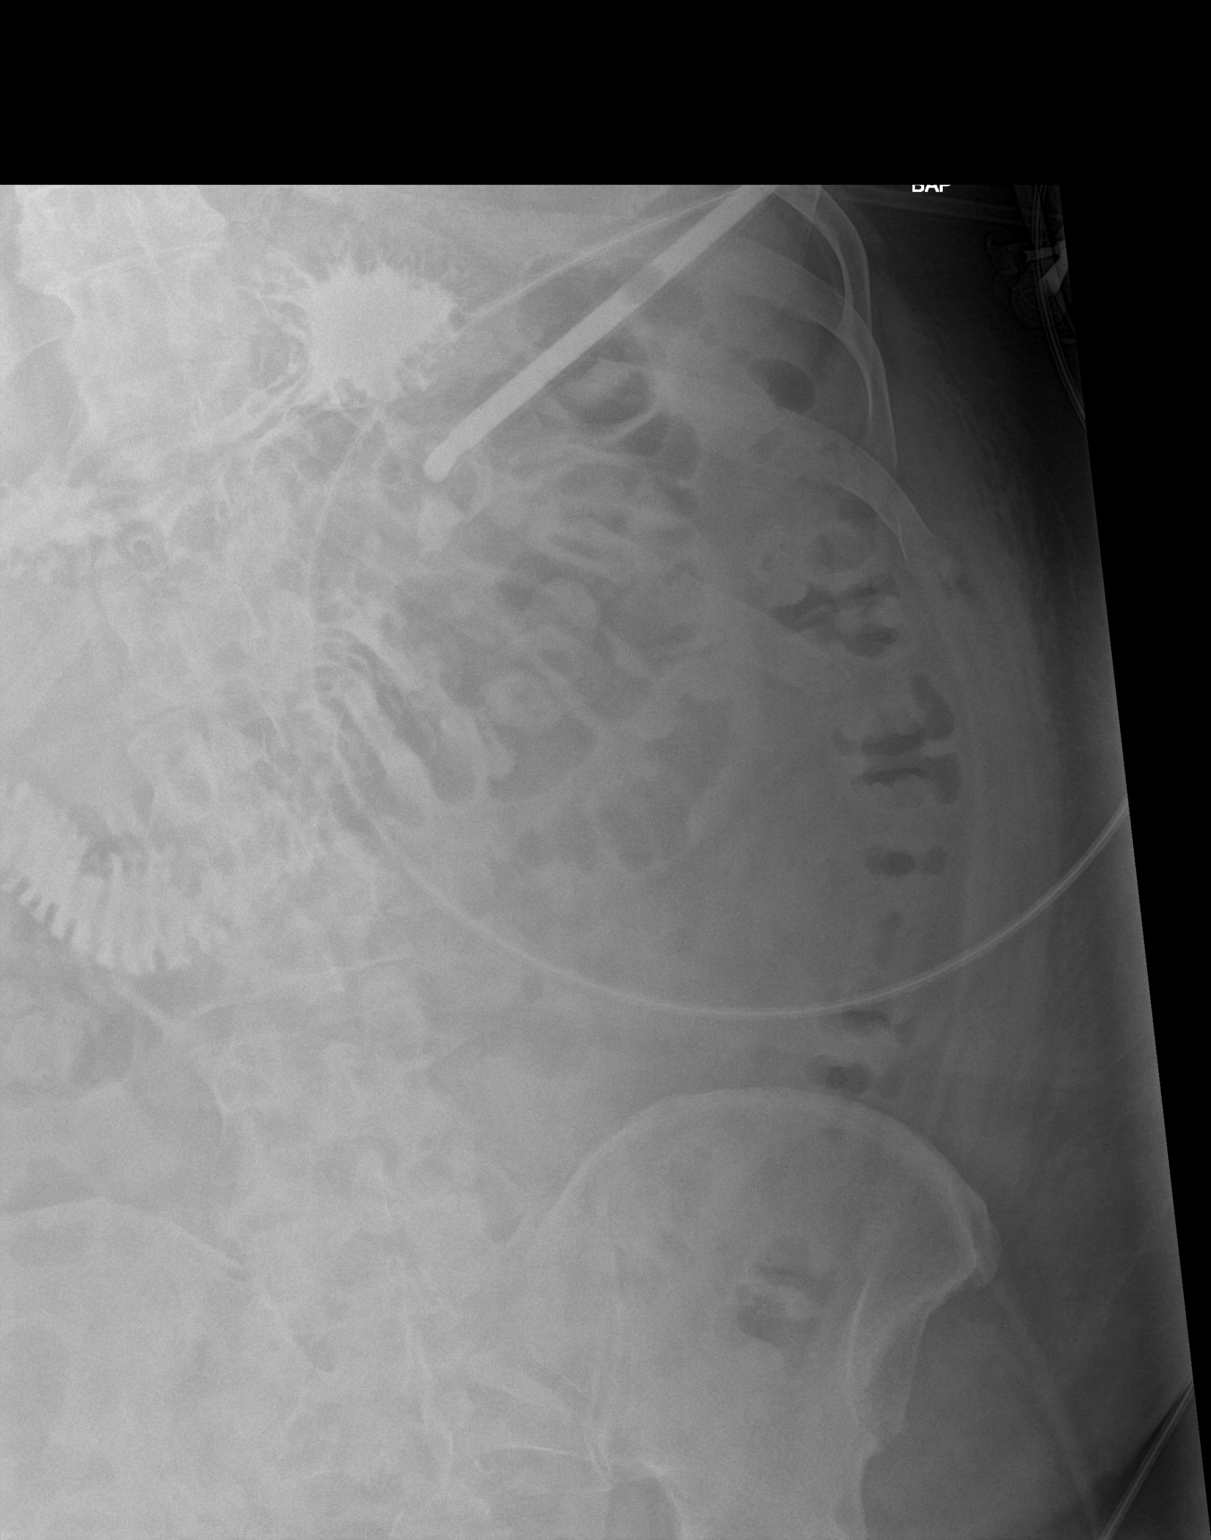

[1 of 1 positions shown; findings below may reference images not displayed]

FINDINGS: Percutaneous gastrostomy with tip in the left upper abdomen.
Contrast injected via tube opacifies the stomach and duodenal
C-loop. No extraluminal spillage of contrast identified.
IMPRESSION: Gastrostomy tube over the left upper abdomen.

## 2021-12-21 IMAGING — DX DG CHEST 1V PORT
1 series · 1 of 1 positions shown · non-contrast
Comparison: 1 day prior

CLINICAL DATA: Respiratory failure.  Off ECMO since yesterday.

EXAM:
PORTABLE CHEST 1 VIEW

[chest ap]
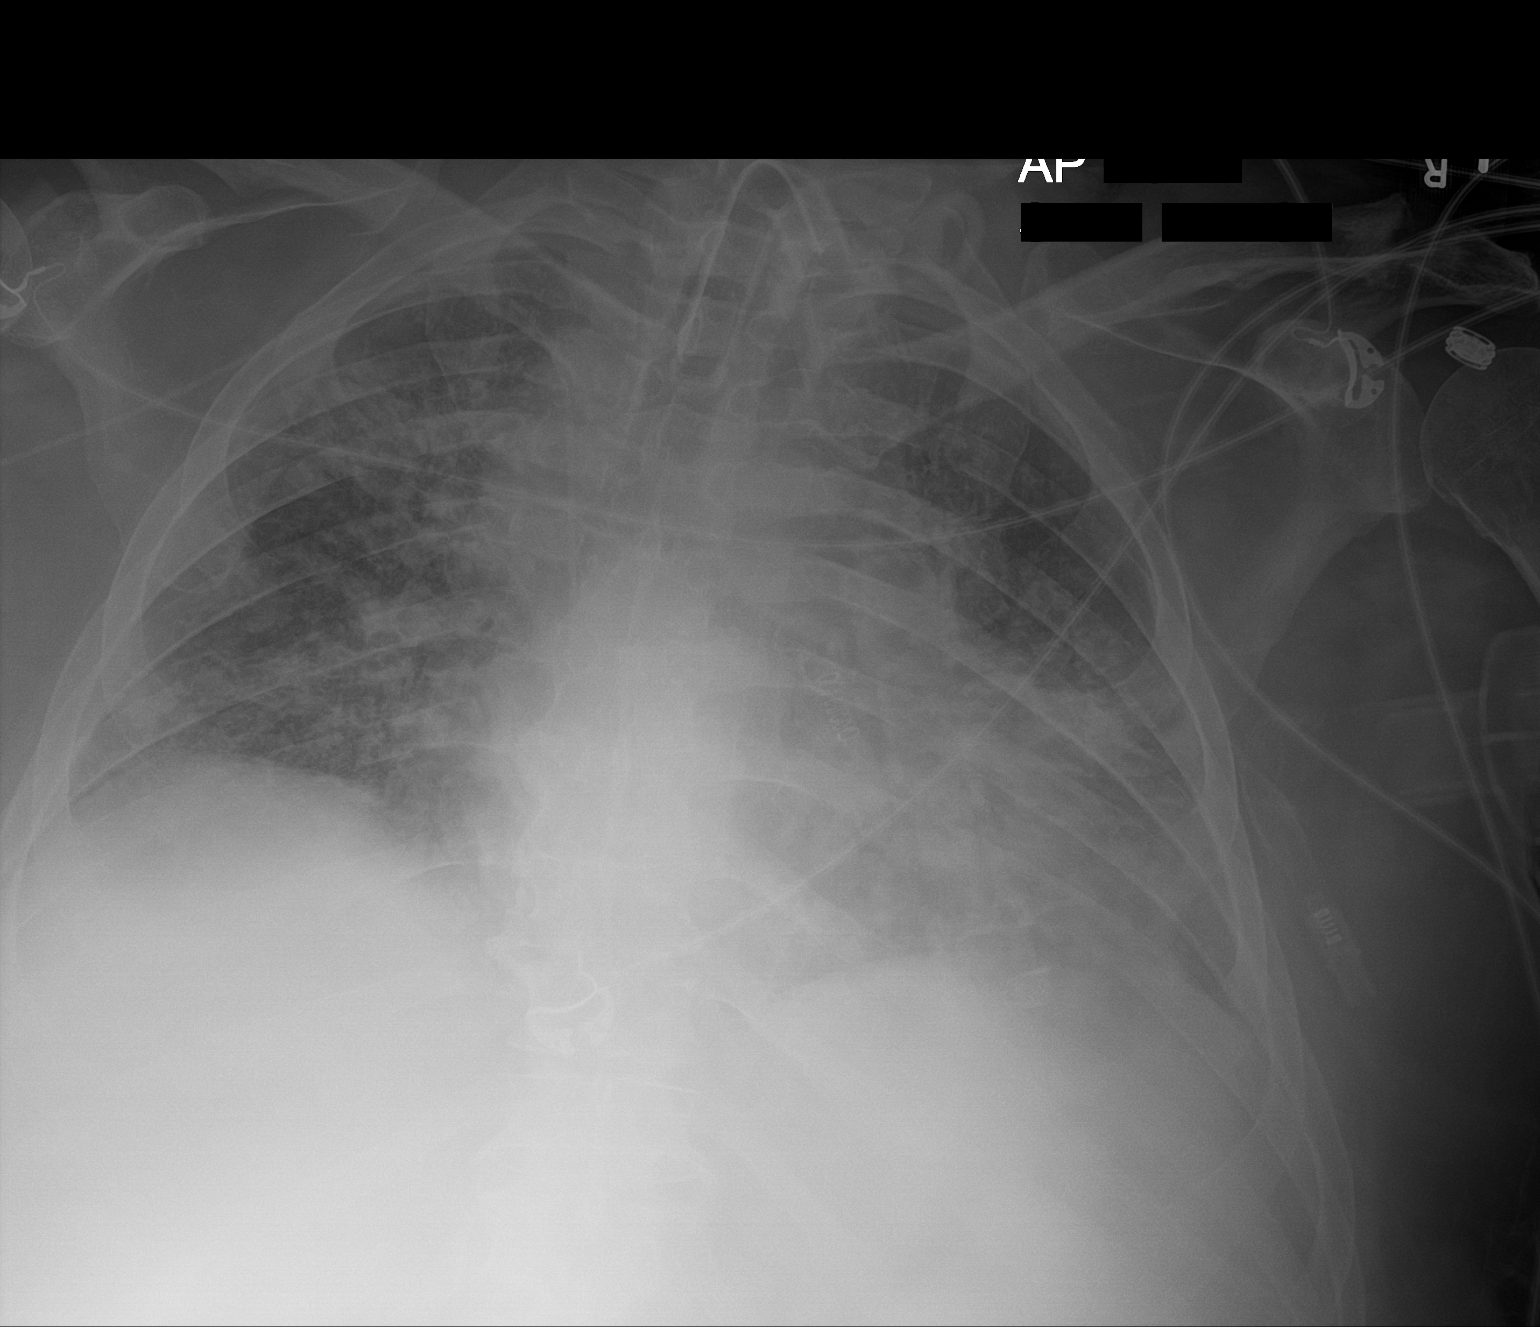

[1 of 1 positions shown; findings below may reference images not displayed]

FINDINGS: Right-sided PICC line tip at low SVC. Tracheostomy appropriately
positioned. Removal of ECMO cannula. Normal heart size for level of
inspiration. No pleural effusion or pneumothorax. Minimal
improvement in right-sided interstitial and airspace disease.
Relatively similar left-sided interstitial and airspace opacities.
IMPRESSION: Minimally improved right-sided aeration. Otherwise similar
interstitial and airspace disease which could represent infection,
edema, and/or ARDS.

## 2021-12-23 ENCOUNTER — Encounter (HOSPITAL_BASED_OUTPATIENT_CLINIC_OR_DEPARTMENT_OTHER): Payer: BC Managed Care – PPO | Admitting: General Surgery

## 2021-12-23 DIAGNOSIS — L97522 Non-pressure chronic ulcer of other part of left foot with fat layer exposed: Secondary | ICD-10-CM | POA: Diagnosis not present

## 2021-12-23 DIAGNOSIS — L97512 Non-pressure chronic ulcer of other part of right foot with fat layer exposed: Secondary | ICD-10-CM | POA: Diagnosis not present

## 2021-12-23 DIAGNOSIS — Z8616 Personal history of COVID-19: Secondary | ICD-10-CM | POA: Diagnosis not present

## 2021-12-23 IMAGING — CT CT ABD-PELV W/ CM
2 of 4 series · 14 of 46 positions shown, 16 images · IV contrast (omnipaque)
Comparison: CT abdomen pelvis-09/05/2019; chest
radiograph-10/11/2019

CLINICAL DATA: Concern for infection surrounding gastrostomy tube.
History of DYJPO-WW infection.

EXAM:
CT ABDOMEN AND PELVIS WITH CONTRAST
TECHNIQUE: Multidetector CT imaging of the abdomen and pelvis was performed
using the standard protocol following bolus administration of
intravenous contrast.
CONTRAST:  60mL OMNIPAQUE IOHEXOL 300 MG/ML  SOLN

[Series 3: abdomen 5.0 · axial · 0.97mm/px · z∈[-448,+42]mm · 11 of 112 slices shown, 13 images]
[im 7/112  soft-tissue]
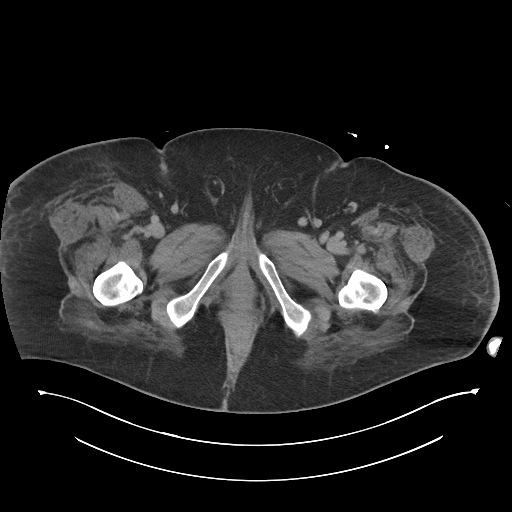
[im 7/112  bone]
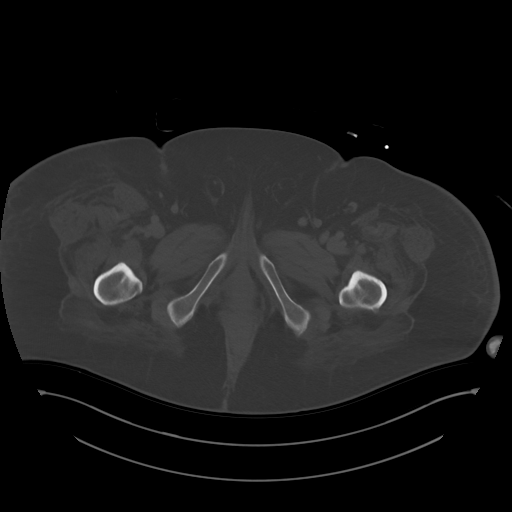
[im 20/112  soft-tissue]
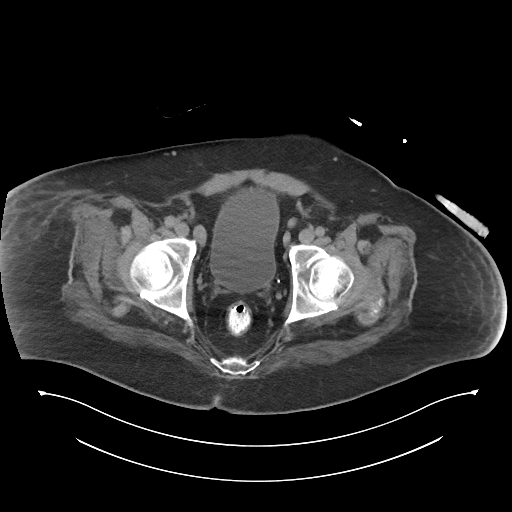
[im 27/112  soft-tissue]
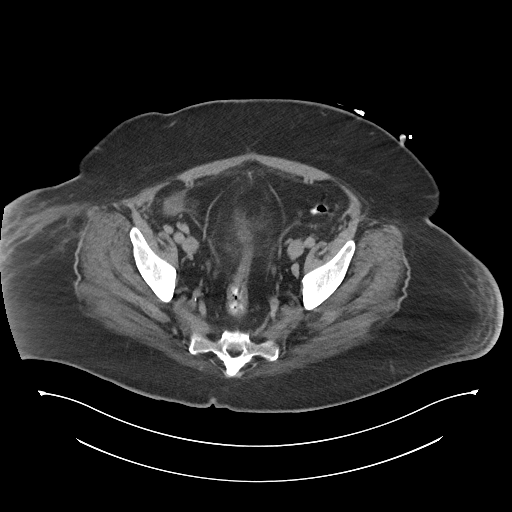
[im 40/112  soft-tissue]
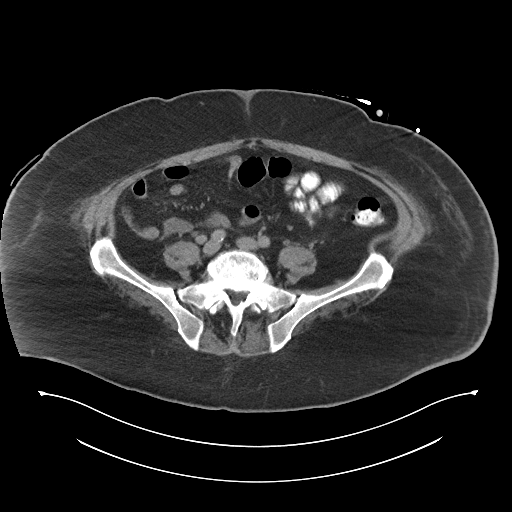
[im 46/112  soft-tissue]
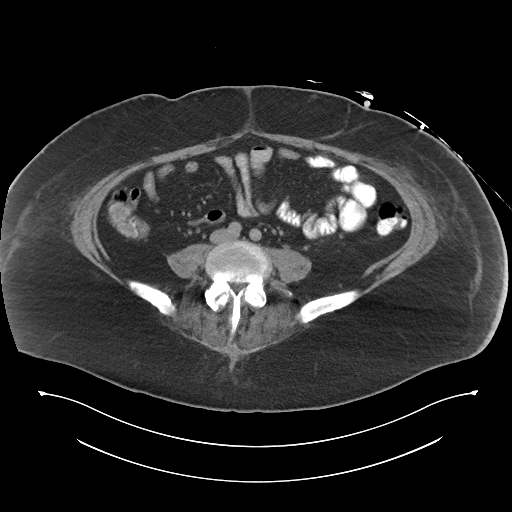
[im 59/112  soft-tissue]
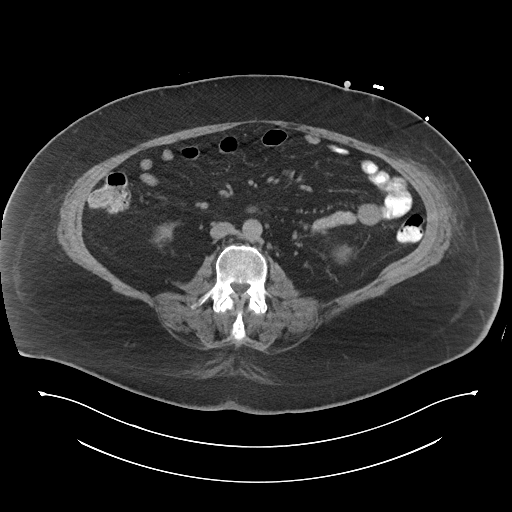
[im 66/112  soft-tissue]
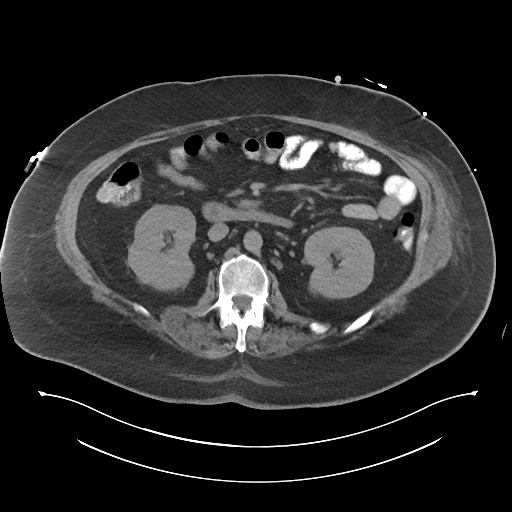
[im 72/112  soft-tissue]
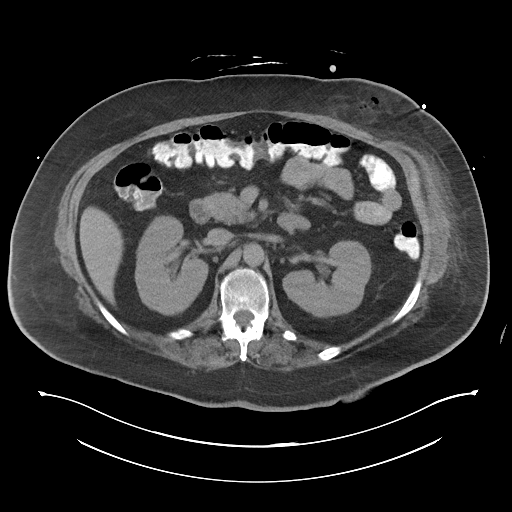
[im 85/112  soft-tissue]
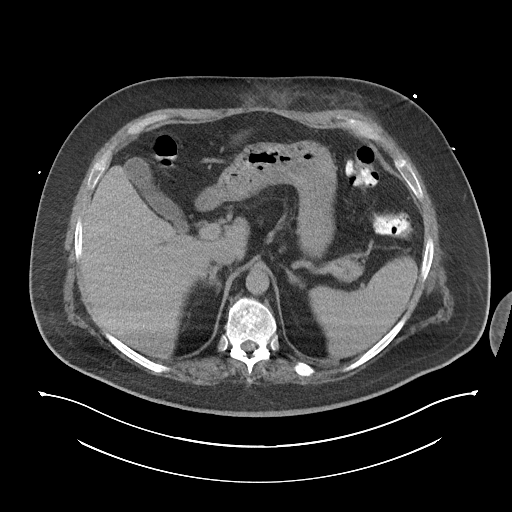
[im 85/112  bone]
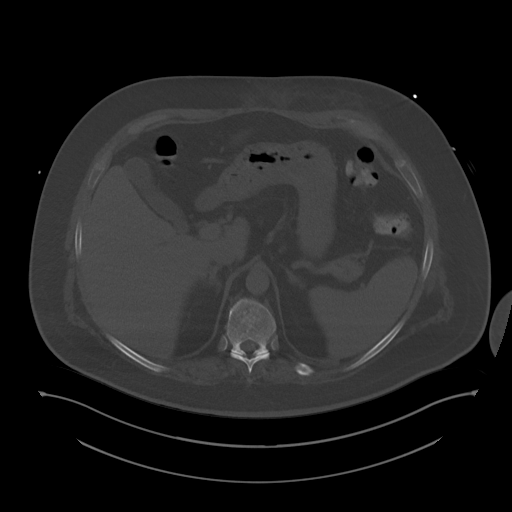
[im 92/112  soft-tissue]
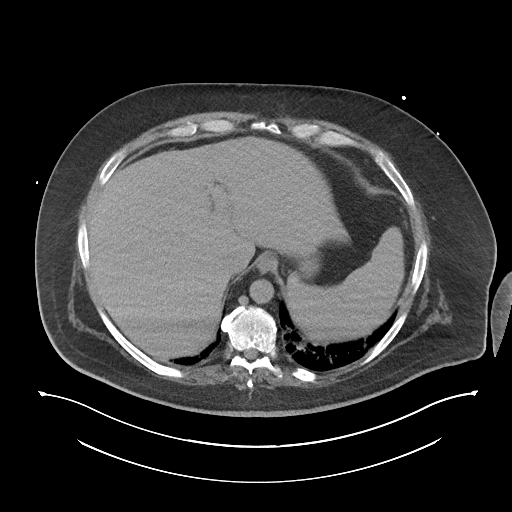
[im 105/112  soft-tissue]
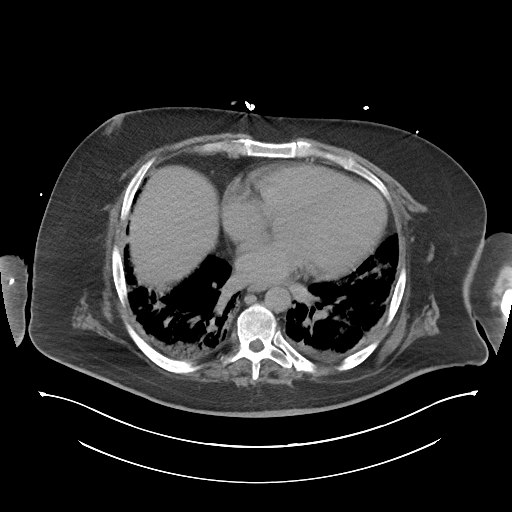

[Series 6: abdomen 3.0 mpr cor · coronal · 0.86mm/px · 3 of 116 slices shown]
[im 39/116  soft-tissue]
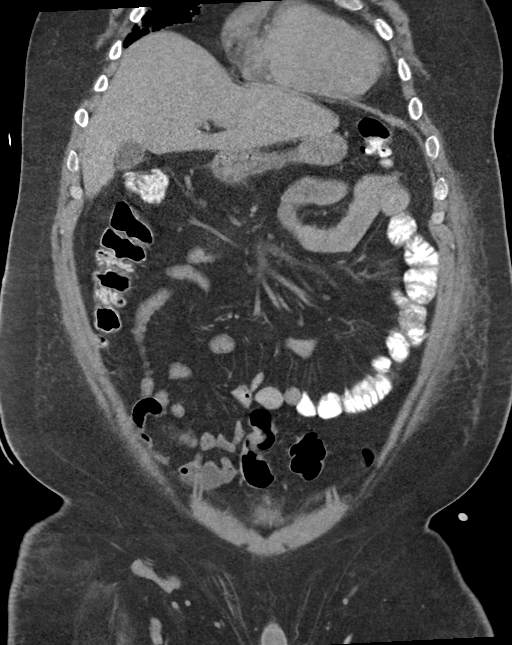
[im 52/116  soft-tissue]
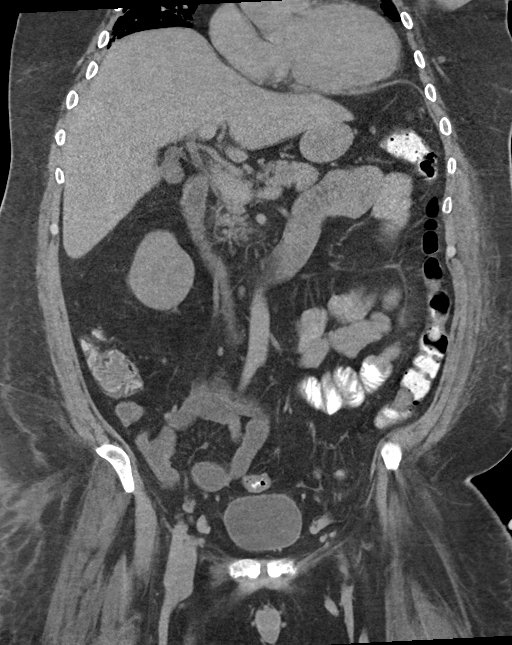
[im 64/116  soft-tissue]
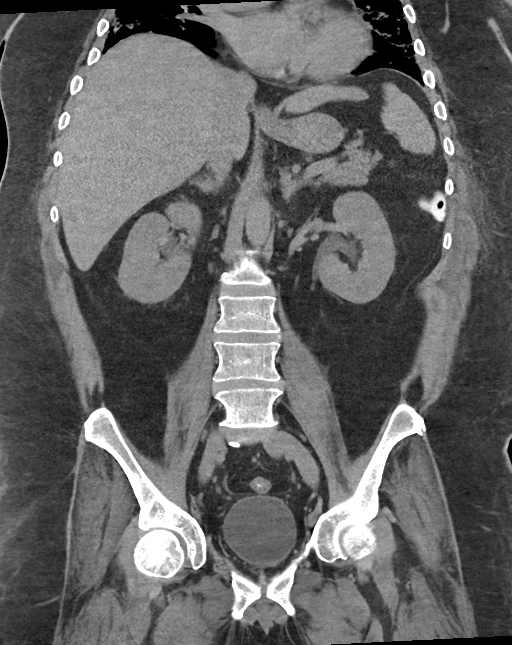

[14 of 46 positions shown; findings below may reference images not displayed]

FINDINGS: Lower chest: Limited visualization of the lower thorax extensive
bilateral interstitial and airspace opacities with scattered air
bronchograms and subpleural consolidative opacities, slightly
improved compared to the 09/05/2019 abdominal CT. Interval apparent
removal of right-sided chest tube. No pleural effusion.

Cardiomegaly.  No significant pericardial effusion.

Hepatobiliary: Normal hepatic contour. Normal appearance of the
gallbladder given degree distention. No radiopaque gallstones. No
intra or extrahepatic biliary ductal dilatation. No ascites.

Pancreas: Normal appearance of the pancreas.

Spleen: Normal appearance of the spleen. Note is made of a small
splenule.

Adrenals/Urinary Tract: There is symmetric enhancement of the
bilateral kidneys. No definite renal stones on this postcontrast
examination. Punctate (approximately 0.9 cm) hypoattenuating lesion
within the superior pole the left kidney (image 35, series 3;
coronal image 73, series 6), is too small to accurately characterize
though favored to represent a renal cyst. No discrete right-sided
renal lesions. No urinary obstruction or perinephric stranding.

Normal appearance the bilateral adrenal glands.

Normal appearance of the urinary bladder given degree of distention.

Stomach/Bowel: Disc retention gastrostomy tube is malpositioned and
currently imbedded within the subcutaneous tissues of the left
anterior upper abdominal wall (image 35, series 3). Scattered
minimal amount of adjacent subcutaneous emphysema. No discrete
definable/drainable fluid collection either along the subcutaneous
track, abdominal wall or within the adjacent abdominal cavity.

Enteric contrast, presumably administered via the gastrostomy tube,
is seen extending to the level of the rectum without evidence of
enteric contrast extravasation, suggested patency of the gastrostomy
track. No evidence of enteric obstruction.

No discrete areas of bowel wall thickening. Normal appearance of the
terminal ileum and appendix. No pneumoperitoneum, pneumatosis or
portal venous gas.

Vascular/Lymphatic: Normal caliber of the abdominal aorta. The major
branch vessels of the abdominal aorta appear patent this non CTA
examination. There is a minimal amount of atherosclerotic plaque
involving the bilateral common iliac arteries, not resulting in
hemodynamically significant stenosis.

Scattered retroperitoneal lymph nodes are numerous though
individually not enlarged by size criteria. No bulky
retroperitoneal, mesenteric, pelvic or inguinal lymphadenopathy.

Reproductive: Dystrophic calcifications within normal sized prostate
gland. No free fluid within the pelvic cul-de-sac.

Other: Scattered body wall subcutaneous edema, most conspicuous
about the bilateral flanks, thighs and midline of the back. Punctate
(approximately 1 cm) subcutaneous nodule within the right abdominal
pannus may represent a sebaceous cyst (image 60, series 3).

Musculoskeletal: No acute or aggressive osseous abnormalities.
Stigmata of dish within the thoracic spine. Mild (about 25%)
compression deformities involving T8 through T12 vertebral bodies
are unchanged and without associated fracture line. Mild-to-moderate
multilevel lumbar spine DDD, worse at L5-S1 with disc space height
loss, endplate irregularity and sclerosis.
IMPRESSION: 1. Malpositioned disc retention gastrostomy tube, currently imbedded
within the subcutaneous tissues of the ventral abdominal wall. Note,
presumably enteric contrast was administered for this examination
via the gastrostomy tube which suggests preserved patency of the
gastrostomy track.
2. Scattered adjacent subcutaneous emphysema without evidence of
definable/drainable fluid collection either within the abdominal
wall or abdominal cavity.
3. No evidence of enteric obstruction.
4. Minimally improved aeration of lung bases with persistent
extensive bilateral interstitial and airspace opacities, nonspecific
though compatible with recent DYJPO-WW infection.
5. Cardiomegaly.

## 2021-12-24 IMAGING — XA IR REPLACE G-TUBE/COLONIC TUBE
9 series · 18 of 24 positions shown · non-contrast
Comparison: none

INDICATION: 43-year-old male with a history of prior surgically placed gastric
tube at the bedside, has become withdrawn

[Series 1: fl (-) angio · 2 of 60 frames shown (1 of 9)]
[frame 10/60]
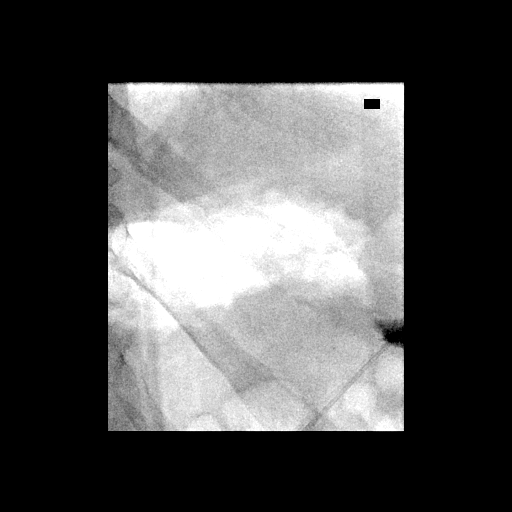
[frame 52/60]
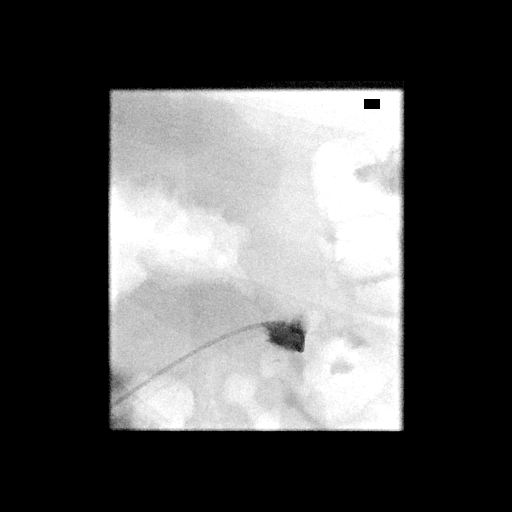

[Series 2: fl (-) angio · 2 of 11 frames shown (2 of 9)]
[frame 1/11]
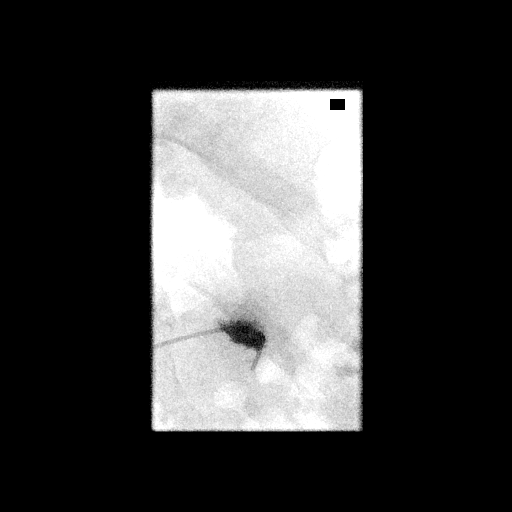
[frame 6/11]
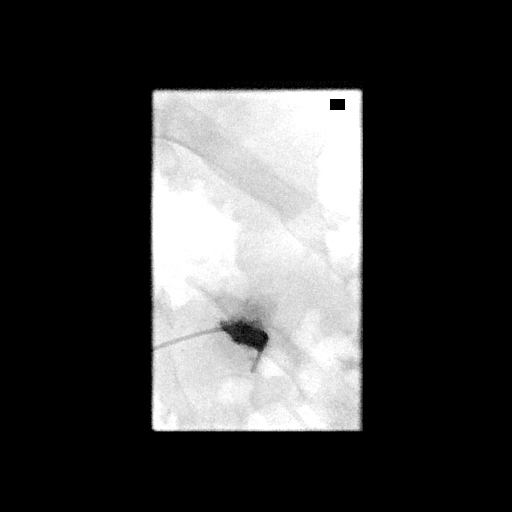

[Series 3: fl (-) angio · 2 of 64 frames shown (3 of 9)]
[frame 12/64]
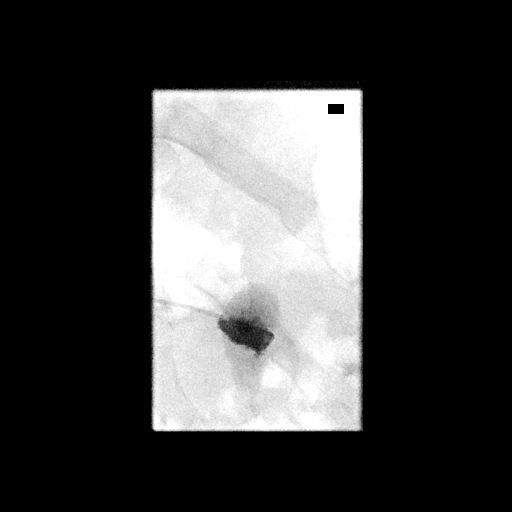
[frame 33/64]
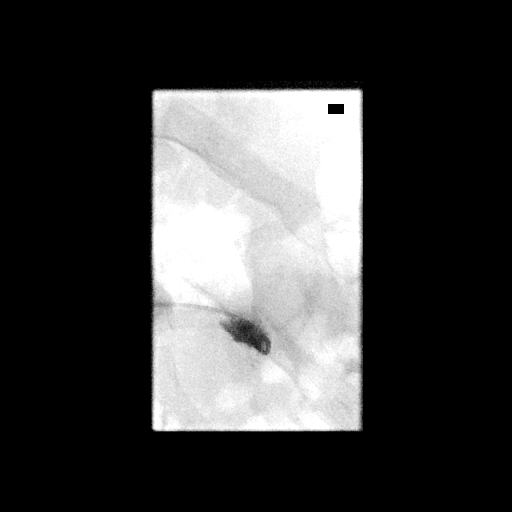

[Series 4: fl (-) angio · 1 of 23 frames shown (4 of 9)]
[frame 4/23]
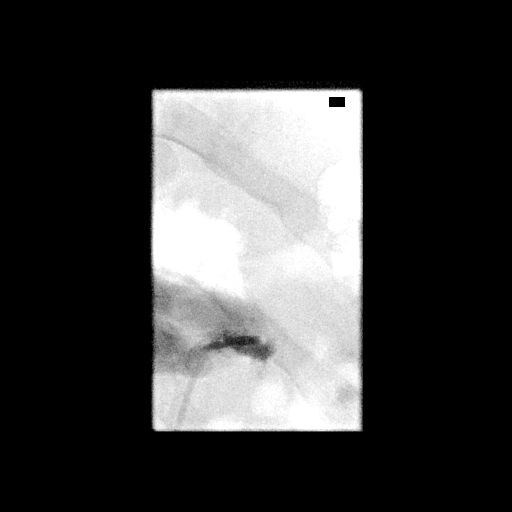

[Series 5: fl (-) angio · 3 of 64 frames shown (5 of 9)]
[frame 1/64]
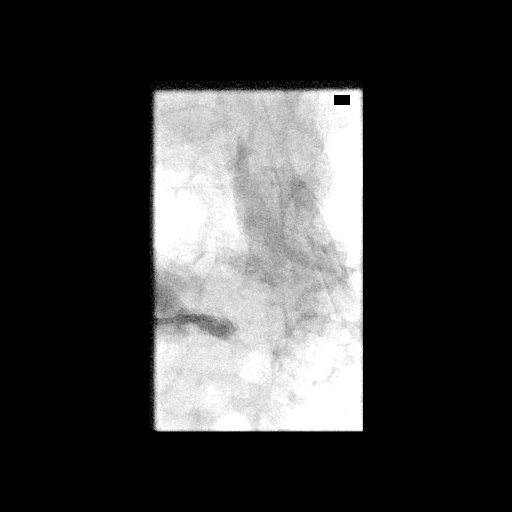
[frame 10/64]
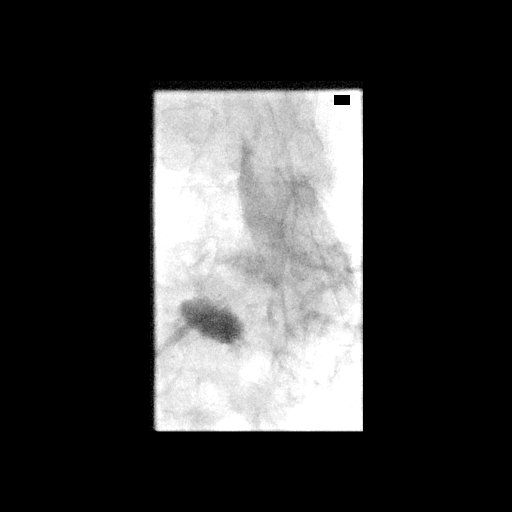
[frame 55/64]
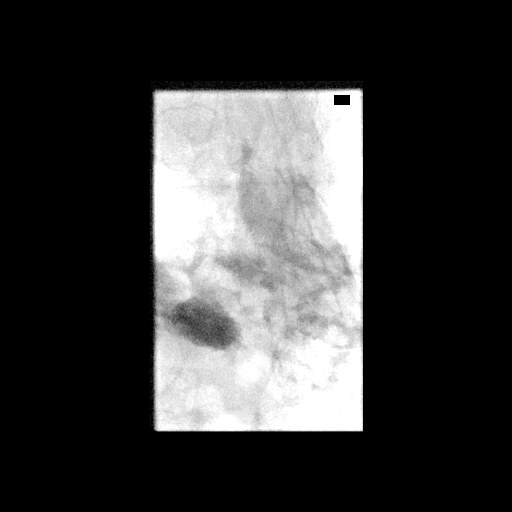

[Series 6: fl (-) angio · 2 of 110 frames shown (6 of 9)]
[frame 56/110]
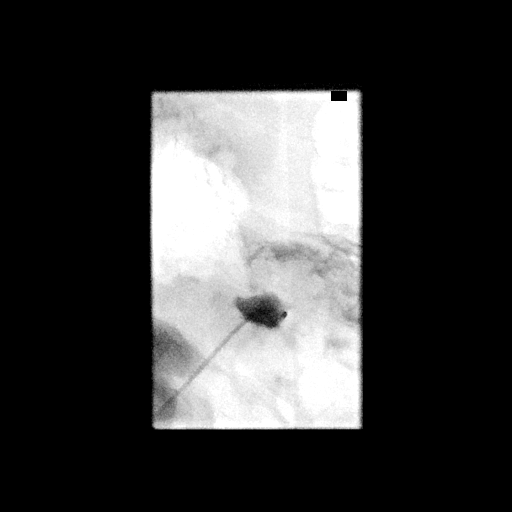
[frame 94/110]
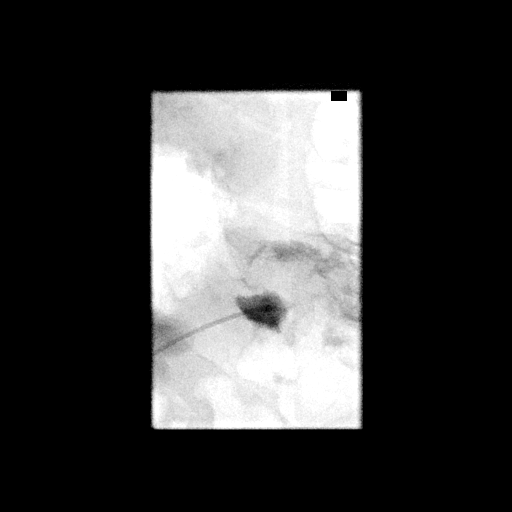

[Series 7: fl (-) angio · 1 of 27 frames shown (7 of 9)]
[frame 13/27]
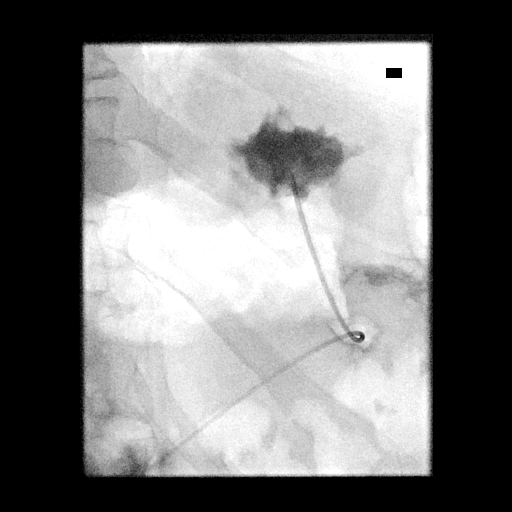

[Series 8: fl (-) angio · 3 of 23 frames shown (8 of 9)]
[frame 4/23]
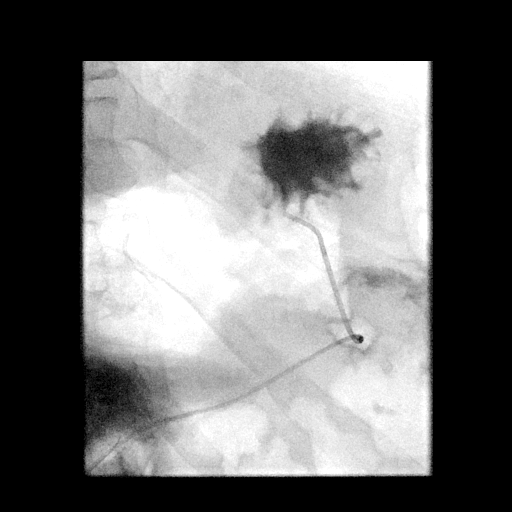
[frame 9/23]
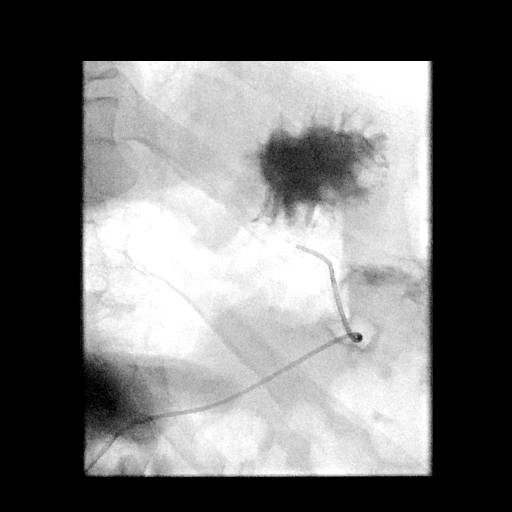
[frame 20/23]
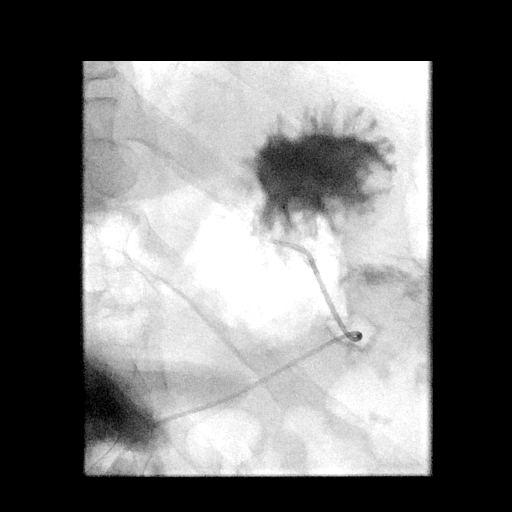

[Series 9: fl (-) angio · 2 of 11 frames shown (9 of 9)]
[frame 8/11]
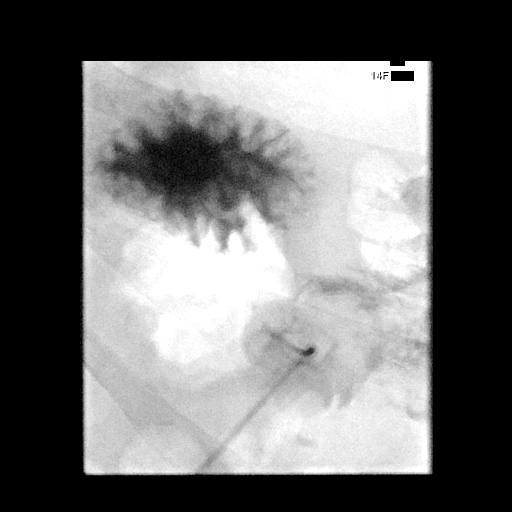
[frame 10/11]
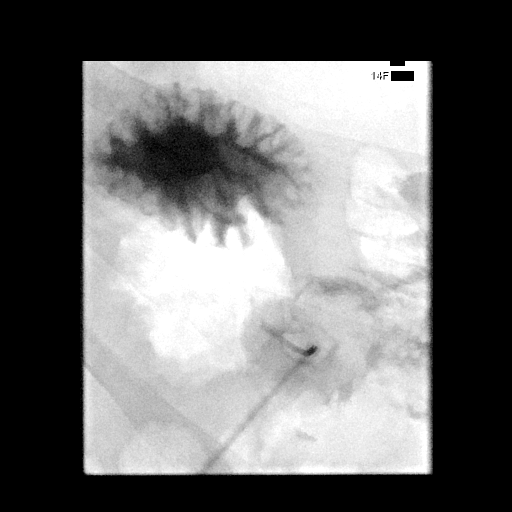

[18 of 24 positions shown; findings below may reference images not displayed]

EXAM:
GASTROSTOMY CATHETER REPLACEMENT

MEDICATIONS:
None

ANESTHESIA/SEDATION:
Versed 0 mg IV; Fentanyl 50 mcg IV

Moderate Sedation Time:  None

The patient was continuously monitored during the procedure by the
interventional radiology nurse under my direct supervision.

CONTRAST:  20mL OMNIPAQUE IOHEXOL 300 MG/ML SOLN - administered into
the gastric lumen.

FLUOROSCOPY TIME:  Fluoroscopy Time: 4 minutes 30 seconds (175 mGy).

COMPLICATIONS:
None

PROCEDURE:
Informed written consent was obtained from the patient and the
patient's family after a thorough discussion of the procedural
risks, benefits and alternatives. All questions were addressed.
Maximal Sterile Barrier Technique was utilized including caps, mask,
sterile gowns, sterile gloves, sterile drape, hand hygiene and skin
antiseptic. A timeout was performed prior to the initiation of the
procedure.

The epigastrium was prepped with Betadine in a sterile fashion, and
a sterile drape was applied covering the operative field. A sterile
gown and sterile gloves were used for the procedure.

Initial contrast injection within adapter was unsuccessful in
opacifying any of the gastric elements.

The ostomy site was probed with a 5 French Kumpe the catheter,
Glidewire, without success.

Ultimately, adapter was pressurized at the ostomy site with hand
injection of contrast, opacifying a thin channel into the stomach.

Ultimately the Kumpe the catheter and the Glidewire were navigated
through this small channel into the stomach lumen.

Once the catheter was within the lumen of the stomach, contrast
confirmed location.

A stiff Amplatz wire was placed.

Serial dilation of the tract a 16 French was performed.

Modified Seldinger technique was then used to place a low-profile 14
French balloon retention into it gastric tube.

7 cc of saline was used to inflate the balloon.

Patient tolerated the procedure well and remained hemodynamically
stable throughout.

No complications were encountered and no significant blood loss
encountered.
IMPRESSION: Status post rescue of a displaced percutaneous gastrostomy, with
placement of a 14 French balloon retention gastric tube.

## 2021-12-24 NOTE — Progress Notes (Signed)
STANISLAUS, KALTENBACH ([id number]) Visit Report for 12/23/2021 Arrival Information Details Patient Name: Date of Service: [name], [place] 12/23/2021 9:30 A M Medical Record Number: [id number] Patient Account Number: [id number] Date of Birth/Sex: Treating RN: 1976-02-22 (46 y.o. [name] Primary Care Amica Harron: [name] Other Clinician: Referring Tymber Stallings: Treating Vannak Montenegro/Extender: [name] in Treatment: 53 Visit Information History Since Last Visit Added or deleted any medications: No Patient Arrived: Wheel Chair Any new allergies or adverse reactions: No Arrival Time: 09:53 Had a fall or experienced change in No Accompanied By: wife activities of daily living that may affect Transfer Assistance: None risk of falls: Patient Identification Verified: Yes Signs or symptoms of abuse/neglect since last visito No Secondary Verification Process Completed: Yes Hospitalized since last visit: No Patient Requires Transmission-Based Precautions: No Implantable device outside of the clinic excluding No Patient Has Alerts: No cellular tissue based products placed in the center since last visit: Has Dressing in Place as Prescribed: Yes Pain Present Now: Yes Electronic Signature(s) Signed: 12/24/2021 8:22:50 AM By: Sandre Kitty Entered By: Sandre Kitty on 12/23/2021 09:54:06 -------------------------------------------------------------------------------- Encounter Discharge Information Details Patient Name: Date of Service: [name], [name] 12/23/2021 9:30 A M Medical Record Number: [id number] Patient Account Number: [id number] Date of Birth/Sex: Treating RN: Oct 04, 1975 (46 y.o. [name] Primary Care Bostyn Kunkler: [name] Other Clinician: Referring Zannah Melucci: Treating Keli Buehner/Extender: [name] in Treatment: 23 Encounter Discharge Information Items Post Procedure Vitals Discharge Condition: Stable Temperature  (F): 98.2 Ambulatory Status: Ambulatory Pulse (bpm): 75 Discharge Destination: Home Respiratory Rate (breaths/min): 20 Transportation: Private Auto Blood Pressure (mmHg): 152/86 Accompanied By: alone Schedule Follow-up Appointment: Yes Clinical Summary of Care: Patient Declined Electronic Signature(s) Signed: 12/23/2021 5:25:10 PM By: Levan Hurst RN, BSN Entered By: Levan Hurst on 12/23/2021 16:34:06 -------------------------------------------------------------------------------- Lower Extremity Assessment Details Patient Name: Date of Service: [name], [place] 12/23/2021 9:30 A M Medical Record Number: [id number] Patient Account Number: [id number] Date of Birth/Sex: Treating RN: 07-10-1976 (46 y.o. [name] Primary Care Demetris Capell: [name] Other Clinician: Referring Anairis Knick: Treating Jaquetta Currier/Extender: [name] in Treatment: 75 Edema Assessment Assessed: [name]: No] [name]: No] Edema: [Left: No] [Right: No] Calf Left: Right: Point of Measurement: 29 cm From Medial Instep 42 cm 44 cm Ankle Left: Right: Point of Measurement: 9 cm From Medial Instep 25.5 cm 24.5 cm Vascular Assessment Pulses: Dorsalis Pedis Palpable: [Left:Yes] [Right:Yes] Electronic Signature(s) Signed: 12/23/2021 5:25:10 PM By: Levan Hurst RN, BSN Entered By: Levan Hurst on 12/23/2021 10:20:50 -------------------------------------------------------------------------------- Multi Wound Chart Details Patient Name: Date of Service: [name], [name] 12/23/2021 9:30 A M Medical Record Number: [id number] Patient Account Number: [id number] Date of Birth/Sex: Treating RN: 12/26/1975 (46 y.o. [name] Primary Care Elbia Paro: [name] Other Clinician: Referring Shaily Librizzi: Treating Jantzen Pilger/Extender: [name] in Treatment: 19 Vital Signs Height(in): 69 Pulse(bpm): 75 Weight(lbs): 280 Blood Pressure(mmHg): 152/86 Body  Mass Index(BMI): 41.3 Temperature(F): 98.2 Respiratory Rate(breaths/min): 20 Photos: [1:Right Calcaneus] [2:Left Calcaneus] [N/A:N/A N/A] Wound Location: [1:Pressure Injury] [2:Pressure Injury] [N/A:N/A] Wounding Event: [1:Pressure Ulcer] [2:Pressure Ulcer] [N/A:N/A] Primary Etiology: [1:Asthma, Angina, Hypertension] [2:Asthma, Angina, Hypertension] [N/A:N/A] Comorbid History: [1:10/07/2019] [2:10/07/2019] [N/A:N/A] Date Acquired: [1:75] [2:75] [N/A:N/A] Weeks of Treatment: [1:Open] [2:Open] [N/A:N/A] Wound Status: [1:No] [2:No] [N/A:N/A] Wound Recurrence: [1:1x0.4x0.3] [2:2.5x1x0.2] [N/A:N/A] Measurements L x W x D (cm) [1:0.314] [2:1.963] [N/A:N/A] A (cm) : rea [1:0.094] [2:0.393] [N/A:N/A] Volume (cm) : [1:99.00%] [2:92.80%] [N/A:N/A] % Reduction in A rea: [  1:99.70%] [2:98.50%] [N/A:N/A] % Reduction in Volume: [1:Category/Stage III] [2:Category/Stage III] [N/A:N/A] Classification: [1:Medium] [2:Medium] [N/A:N/A] Exudate A mount: [1:Serosanguineous] [2:Serosanguineous] [N/A:N/A] Exudate Type: [1:red, brown] [2:red, brown] [N/A:N/A] Exudate Color: [1:Distinct, outline attached] [2:Distinct, outline attached] [N/A:N/A] Wound Margin: [1:Large (67-100%)] [2:Medium (34-66%)] [N/A:N/A] Granulation A mount: [1:Red, Pink] [2:Red, Pink] [N/A:N/A] Granulation Quality: [1:Small (1-33%)] [2:Medium (34-66%)] [N/A:N/A] Necrotic A mount: [1:Fat Layer (Subcutaneous Tissue): Yes Fat Layer (Subcutaneous Tissue): Yes N/A] Exposed Structures: [1:Fascia: No Tendon: No Muscle: No Joint: No Bone: No Medium (34-66%)] [2:Fascia: No Tendon: No Muscle: No Joint: No Bone: No Medium (34-66%)] [N/A:N/A] Epithelialization: [1:Debridement - Selective/Open Wound Debridement - Selective/Open Wound N/A] Debridement: Pre-procedure Verification/Time Out 10:28 [2:10:28] [N/A:N/A] Taken: [1:Callus] [2:Callus] [N/A:N/A] Tissue Debrided: [1:Non-Viable Tissue] [2:Non-Viable Tissue] [N/A:N/A] Level: [1:0.4] [2:2.5]  [N/A:N/A] Debridement A (sq cm): [1:rea Curette] [2:Curette] [N/A:N/A] Instrument: [1:Minimum] [2:Minimum] [N/A:N/A] Bleeding: [1:Pressure] [2:Pressure] [N/A:N/A] Hemostasis A chieved: [1:0] [2:0] [N/A:N/A] Procedural Pain: [1:0] [2:0] [N/A:N/A] Post Procedural Pain: [1:Procedure was tolerated well] [2:Procedure was tolerated well] [N/A:N/A] Debridement Treatment Response: [1:1x0.4x0.3] [2:2.5x1x0.2] [N/A:N/A] Post Debridement Measurements L x W x D (cm) [1:0.094] [2:0.393] [N/A:N/A] Post Debridement Volume: (cm) [1:Category/Stage III] [2:Category/Stage III] [N/A:N/A] Post Debridement Stage: [1:Debridement] [2:Cellular or Tissue Based Product] [N/A:N/A] Procedures Performed: [2:Debridement T Contact Cast otal] Treatment Notes Electronic Signature(s) Signed: 12/23/2021 11:04:54 AM By: Fredirick Maudlin MD FACS Signed: 12/24/2021 10:43:18 AM By: [name] Entered By: Fredirick Maudlin on 12/23/2021 11:04:54 -------------------------------------------------------------------------------- Multi-Disciplinary Care Plan Details Patient Name: Date of Service: [name], [name] 12/23/2021 9:30 A M Medical Record Number: [id number] Patient Account Number: [id number] Date of Birth/Sex: Treating RN: [birth year] (46 y.o. [name] Primary Care Treasa [name]: [name] Other Clinician: Referring Quaneisha Hanisch: Treating Emillie Chasen/Extender: [name] in Treatment: 19 Multidisciplinary Care Plan reviewed with physician Active Inactive Wound/Skin Impairment Nursing Diagnoses: Impaired tissue integrity Knowledge deficit related to ulceration/compromised skin integrity Goals: Patient/caregiver will verbalize understanding of skin care regimen Date Initiated: 07/15/2020 Target Resolution Date: 12/31/2021 Goal Status: Active Ulcer/skin breakdown will have a volume reduction of 30% by week 4 Date Initiated: 07/15/2020 Date Inactivated: 08/20/2020 Target Resolution Date:  09/03/2020 Goal Status: Unmet Unmet Reason: no major changes. Ulcer/skin breakdown will heal within 14 weeks Date Initiated: 12/04/2020 Date Inactivated: 12/10/2020 Target Resolution Date: 12/10/2020 Unmet Reason: wounds still open at 14 Goal Status: Unmet weeks and today 21 weeks. Interventions: Assess patient/caregiver ability to obtain necessary supplies Assess patient/caregiver ability to perform ulcer/skin care regimen upon admission and as needed Assess ulceration(s) every visit Provide education on ulcer and skin care Treatment Activities: Skin care regimen initiated : 07/15/2020 Topical wound management initiated : 07/15/2020 Notes: Electronic Signature(s) Signed: 12/23/2021 5:25:10 PM By: Levan Hurst RN, BSN Entered By: Levan Hurst on 12/23/2021 10:28:00 -------------------------------------------------------------------------------- Pain Assessment Details Patient Name: Date of Service: [name], [place] 12/23/2021 9:30 A M Medical Record Number: [id number] Patient Account Number: [id number] Date of Birth/Sex: Treating RN: March 28, 1976 (46 y.o. [name] Primary Care Kealy Lewter: [name] Other Clinician: Referring Omauri Boeve: Treating Georgana Romain/Extender: [name] in Treatment: 31 Active Problems Location of Pain Severity and Description of Pain Patient Has Paino No Site Locations Pain Management and Medication Current Pain Management: Electronic Signature(s) Signed: 12/24/2021 8:22:50 AM By: Sandre Kitty Signed: 12/24/2021 10:43:18 AM By: [name] Entered By: Sandre Kitty on 12/23/2021 09:54:43 -------------------------------------------------------------------------------- Patient/Caregiver Education Details Patient Name: Date of Service: Ciro Backer 5/18/2023andnbsp9:30 [place] Record Number: [id number] Patient Account Number: [id number] Date  of Birth/Gender: Treating RN: 10/08/1975 (46 y.o. [name] Primary Care Physician: [name] Other Clinician: Referring Physician: Treating Physician/Extender: [name] in Treatment: 6 Education Assessment Education Provided To: Patient Education Topics Provided Wound/Skin Impairment: Methods: Explain/Verbal Responses: State content correctly [employer]) Signed: 12/23/2021 5:25:10 PM By: Levan Hurst RN, BSN Entered By: Levan Hurst on 12/23/2021 16:14:40 -------------------------------------------------------------------------------- Wound Assessment Details Patient Name: Date of Service: [name], [place] 12/23/2021 9:30 A M Medical Record Number: [id number] Patient Account Number: [id number] Date of Birth/Sex: Treating RN: 10-Dec-1975 (46 y.o. [name] Primary Care Audri Kozub: [name] Other Clinician: Referring Sheridan Hew: Treating Jordana Dugue/Extender: [name] in Treatment: 75 Wound Status Wound Number: 1 Primary Etiology: Pressure Ulcer Wound Location: Right Calcaneus Wound Status: Open Wounding Event: Pressure Injury Comorbid History: Asthma, Angina, Hypertension Date Acquired: 10/07/2019 Weeks Of Treatment: 75 Clustered Wound: No Photos Wound Measurements Length: (cm) 1 Width: (cm) 0.4 Depth: (cm) 0.3 Area: (cm) 0.314 Volume: (cm) 0.094 % Reduction in Area: 99% % Reduction in Volume: 99.7% Epithelialization: Medium (34-66%) Tunneling: No Undermining: No Wound Description Classification: Category/Stage III Wound Margin: Distinct, outline attached Exudate Amount: Medium Exudate Type: Serosanguineous Exudate Color: red, brown Foul Odor After Cleansing: No Slough/Fibrino Yes Wound Bed Granulation Amount: Large (67-100%) Exposed Structure Granulation Quality: Red, Pink Fascia Exposed: No Necrotic Amount: Small (1-33%) Fat Layer (Subcutaneous Tissue) Exposed: Yes Necrotic Quality: Adherent Slough Tendon Exposed: No Muscle  Exposed: No Joint Exposed: No Bone Exposed: No Treatment Notes Wound #1 (Calcaneus) Wound Laterality: Right Cleanser Normal Saline Discharge Instruction: Cleanse the wound with Normal Saline prior to applying a clean dressing using gauze sponges, not tissue or cotton balls. Wound Cleanser Discharge Instruction: Cleanse the wound with wound cleanser prior to applying a clean dressing using gauze sponges, not tissue or cotton balls. Peri-Wound Care Topical Primary Dressing PolyMem Silver Non-Adhesive Dressing, 4.25x4.25 in Discharge Instruction: Apply to wound bed as instructed Secondary Dressing Zetuvit Plus 4x8 in Discharge Instruction: Apply over primary dressing as directed. Secured With Elastic Bandage 4 inch (ACE bandage) Discharge Instruction: Secure with ACE bandage as directed. Kerlix Roll Sterile, 4.5x3.1 (in/yd) Discharge Instruction: Secure with Kerlix as directed. Compression Wrap Compression Stockings Add-Ons Electronic Signature(s) Signed: 12/23/2021 5:25:10 PM By: Levan Hurst RN, BSN Signed: 12/24/2021 10:43:18 AM By: [name] Entered By: Levan Hurst on 12/23/2021 10:20:22 -------------------------------------------------------------------------------- Wound Assessment Details Patient Name: Date of Service: [name], [place] 12/23/2021 9:30 A M Medical Record Number: [id number] Patient Account Number: [id number] Date of Birth/Sex: Treating RN: [birth year] (46 y.o. [name] Primary Care Porchia Sinkler: [name] Other Clinician: Referring Charlane Westry: Treating Dewight Catino/Extender: [name] in Treatment: 75 Wound Status Wound Number: 2 Primary Etiology: Pressure Ulcer Wound Location: Left Calcaneus Wound Status: Open Wounding Event: Pressure Injury Comorbid History: Asthma, Angina, Hypertension Date Acquired: 10/07/2019 Weeks Of Treatment: 75 Clustered Wound: No Photos Wound Measurements Length: (cm) 2.5 Width:  (cm) 1 Depth: (cm) 0.2 Area: (cm) 1.963 Volume: (cm) 0.393 % Reduction in Area: 92.8% % Reduction in Volume: 98.5% Epithelialization: Medium (34-66%) Tunneling: No Undermining: No Wound Description Classification: Category/Stage III Wound Margin: Distinct, outline attached Exudate Amount: Medium Exudate Type: Serosanguineous Exudate Color: red, brown Foul Odor After Cleansing: No Slough/Fibrino Yes Wound Bed Granulation Amount: Medium (34-66%) Exposed Structure Granulation Quality: Red, Pink Fascia Exposed: No Necrotic Amount: Medium (34-66%) Fat Layer (Subcutaneous Tissue) Exposed: Yes Necrotic Quality: Adherent Slough Tendon Exposed: No Muscle Exposed: No Joint Exposed:  No Bone Exposed: No Treatment Notes Wound #2 (Calcaneus) Wound Laterality: Left Cleanser Normal Saline Discharge Instruction: Cleanse the wound with Normal Saline prior to applying a clean dressing using gauze sponges, not tissue or cotton balls. Wound Cleanser Discharge Instruction: Cleanse the wound with wound cleanser prior to applying a clean dressing using gauze sponges, not tissue or cotton balls. Peri-Wound Care Zinc Oxide Ointment 30g tube Discharge Instruction: Apply Zinc Oxide to periwound with each dressing change Topical Primary Dressing Vendaje Secondary Dressing Zetuvit Plus 4x8 in Discharge Instruction: Apply over primary dressing as directed. Secured With [employer], 4.5x3.1 (in/yd) Discharge Instruction: Secure with Kerlix as directed. 81M Medipore H Soft Cloth Surgical T ape, 4 x 10 (in/yd) Discharge Instruction: Secure with tape as directed. Compression Wrap Compression Stockings Add-Ons Electronic Signature(s) Signed: 12/23/2021 5:25:10 PM By: Levan Hurst RN, BSN Signed: 12/24/2021 10:43:18 AM By: [name] Entered By: Levan Hurst on 12/23/2021 10:20:38 -------------------------------------------------------------------------------- Vitals  Details Patient Name: Date of Service: [name], [place] 12/23/2021 9:30 A M Medical Record Number: [id number] Patient Account Number: [id number] Date of Birth/Sex: Treating RN: 1975-09-17 (46 y.o. [name] Primary Care Keene Gilkey: [name] Other Clinician: Referring Gaylynn Seiple: Treating Mata Rowen/Extender: [name] in Treatment: 61 Vital Signs Time Taken: 09:54 Temperature (F): 98.2 Height (in): 69 Pulse (bpm): 75 Weight (lbs): 280 Respiratory Rate (breaths/min): 20 Body Mass Index (BMI): 41.3 Blood Pressure (mmHg): 152/86 Reference Range: 80 - 120 mg / dl Electronic Signature(s) Signed: 12/24/2021 8:22:50 AM By: Sandre Kitty Entered By: Sandre Kitty on 12/23/2021 09:54:31

## 2021-12-24 NOTE — Progress Notes (Signed)
GRIFFYN, KUCINSKI ([id number]) Visit Report for 12/23/2021 Chief Complaint Document Details Patient Name: Date of Service: [name], [name] 12/23/2021 9:30 A M Medical Record Number: [id number] Patient Account Number: [id number] Date of Birth/Sex: Treating RN: 1975/09/02 (45 y.o. [name] Primary Care Provider: [name] Other Clinician: Referring Provider: Treating Provider/Extender: [name] in Treatment: 10 Information Obtained from: Patient Chief Complaint Bilateral Plantar Foot Ulcers Electronic Signature(s) Signed: 12/23/2021 11:05:01 AM By: Fredirick Maudlin MD FACS Entered By: Fredirick Maudlin on 12/23/2021 11:05:01 -------------------------------------------------------------------------------- Cellular or Tissue Based Product Details Patient Name: Date of Service: [name], [name] 12/23/2021 9:30 A M Medical Record Number: [id number] Patient Account Number: [id number] Date of Birth/Sex: Treating RN: 1975-11-19 (46 y.o. [name] Primary Care Provider: [name] Other Clinician: Referring Provider: Treating Provider/Extender: [name] in Treatment: 5 Cellular or Tissue Based Product Type Wound #2 Left Calcaneus Applied to: Performed By: Physician Fredirick Maudlin, MD Cellular or Tissue Based Product Type: Other Level of Consciousness (Pre-procedure): Awake and Alert Pre-procedure Verification/Time Out Yes - 10:35 Taken: Location: genitalia / hands / feet / multiple digits Wound Size (sq cm): 2.5 Product Size (sq cm): 2 Waste Size (sq cm): 0 Amount of Product Applied (sq cm): 2 Instrument Used: Forceps Lot #: [id number] Order #: 2 Expiration Date: 07/23/2024 Fenestrated: No Reconstituted: No Secured: Yes Secured With: Steri-Strips Dressing Applied: Yes Primary Dressing: gauze, kerlix Procedural Pain: 0 Post Procedural Pain: 0 Response to Treatment: Procedure was tolerated  well Level of Consciousness (Post- Awake and Alert procedure): Post Procedure Diagnosis Same as Pre-procedure Notes Trial Vendaja #2 applied in standard fashion Electronic Signature(s) Signed: 12/23/2021 4:19:50 PM By: Fredirick Maudlin MD FACS Signed: 12/23/2021 5:25:10 PM By: Levan Hurst RN, BSN Previous Signature: 12/23/2021 12:01:39 PM Version By: Fredirick Maudlin MD FACS Entered By: Levan Hurst on 12/23/2021 16:14:24 -------------------------------------------------------------------------------- Debridement Details Patient Name: Date of Service: [name], [name] 12/23/2021 9:30 A M Medical Record Number: [id number] Patient Account Number: [id number] Date of Birth/Sex: Treating RN: Aug 17, 1975 (46 y.o. [name] Primary Care Provider: [name] Other Clinician: Referring Provider: Treating Provider/Extender: [name] in Treatment: 29 Debridement Performed for Assessment: Wound #1 Right Calcaneus Performed By: Physician Fredirick Maudlin, MD Debridement Type: Debridement Level of Consciousness (Pre-procedure): Awake and Alert Pre-procedure Verification/Time Out Yes - 10:28 Taken: Start Time: 10:28 T Area Debrided (L x W): otal 1 (cm) x 0.4 (cm) = 0.4 (cm) Tissue and other material debrided: Non-Viable, Callus Level: Non-Viable Tissue Debridement Description: Selective/Open Wound Instrument: Curette Bleeding: Minimum Hemostasis Achieved: Pressure End Time: 10:29 Procedural Pain: 0 Post Procedural Pain: 0 Response to Treatment: Procedure was tolerated well Level of Consciousness (Post- Awake and Alert procedure): Post Debridement Measurements of Total Wound Length: (cm) 1 Stage: Category/Stage III Width: (cm) 0.4 Depth: (cm) 0.3 Volume: (cm) 0.094 Character of Wound/Ulcer Post Debridement: Improved Post Procedure Diagnosis Same as Pre-procedure Electronic Signature(s) Signed: 12/23/2021 12:01:39 PM By: Fredirick Maudlin MD  FACS Signed: 12/23/2021 5:25:10 PM By: Levan Hurst RN, BSN Entered By: Levan Hurst on 12/23/2021 10:29:58 -------------------------------------------------------------------------------- Debridement Details Patient Name: Date of Service: [name], [name] E. 12/23/2021 9:30 A M Medical Record Number: [id number] Patient Account Number: [id number] Date of Birth/Sex: Treating RN: Sep 26, 1975 (45 y.o. [name] Primary Care Provider: [name] Other Clinician: Referring Provider: Treating Provider/Extender: [name] in Treatment: 74 Debridement Performed for Assessment: Wound #2 Left Calcaneus Performed By:  Physician Fredirick Maudlin, MD Debridement Type: Debridement Level of Consciousness (Pre-procedure): Awake and Alert Pre-procedure Verification/Time Out Yes - 10:28 Taken: Start Time: 10:29 T Area Debrided (L x W): otal 2.5 (cm) x 1 (cm) = 2.5 (cm) Tissue and other material debrided: Non-Viable, Callus Level: Non-Viable Tissue Debridement Description: Selective/Open Wound Instrument: Curette Bleeding: Minimum Hemostasis Achieved: Pressure End Time: 10:31 Procedural Pain: 0 Post Procedural Pain: 0 Response to Treatment: Procedure was tolerated well Level of Consciousness (Post- Awake and Alert procedure): Post Debridement Measurements of Total Wound Length: (cm) 2.5 Stage: Category/Stage III Width: (cm) 1 Depth: (cm) 0.2 Volume: (cm) 0.393 Character of Wound/Ulcer Post Debridement: Improved Post Procedure Diagnosis Same as Pre-procedure Electronic Signature(s) Signed: 12/23/2021 12:01:39 PM By: Fredirick Maudlin MD FACS Signed: 12/23/2021 5:25:10 PM By: Levan Hurst RN, BSN Entered By: Levan Hurst on 12/23/2021 10:36:54 -------------------------------------------------------------------------------- HPI Details Patient Name: Date of Service: [name], [name] E. 12/23/2021 9:30 A M Medical Record Number: [id number] Patient  Account Number: [id number] Date of Birth/Sex: Treating RN: Jun 17, 1976 (46 y.o. [name] Primary Care Provider: [name] Other Clinician: Referring Provider: Treating Provider/Extender: [name] in Treatment: 54 History of Present Illness HPI Description: Wounds are12/03/2020 upon evaluation today patient presents for initial inspection here in our clinic concerning issues he has been having with the bottoms of his feet bilaterally. He states these actually occurred as wounds when he was hospitalized for 5 months secondary to Covid. He was apparently with tilting bed where he was in an upright position quite frequently and apparently this occurred in some way shape or form during that time. Fortunately there is no sign of active infection at this time. No fevers, chills, nausea, vomiting, or diarrhea. With that being said he still has substantial wounds on the plantar aspects of his feet Theragen require quite a bit of work to get these to heal. He has been using Santyl currently though that is been problematic both in receiving the medication as well as actually paid for it as it is become quite expensive. Prior to the experience with Covid the patient really did not have any major medical problems other than hypertension he does have some mild generalized weakness following the Covid experience. 07/22/2020 on evaluation today patient appears to be doing okay in regard to his foot ulcers I feel like the wound beds are showing signs of better improvement that I do believe the Iodoflex is helping in this regard. With that being said he does have a lot of drainage currently and this is somewhat blue/green in nature which is consistent with Pseudomonas. I do think a culture today would be appropriate for [place] to evaluate and see if that is indeed the case I would likely start him on antibiotic orally as well he is not allergic to Cipro knows of no issues he has had in  the past 12/21; patient was admitted to the clinic earlier this month with bilateral presumed pressure ulcers on the bottom of his feet apparently related to excessive pressure from a tilt table arrangement in the intensive care unit. Patient relates this to being on ECMO but I am not really sure that is exactly related to that. I must say I have never seen anything like this. He has fairly extensive full-thickness wounds extending from his heel towards his midfoot mostly centered laterally. There is already been some healing distally. He does not appear to have an arterial issue. He has been using gentamicin to the wound  surfaces with Iodoflex to help with ongoing debridement 1/6; this is a patient with pressure ulcers on the bottom of his feet related to excessive pressure from a standing position in the intensive care unit. He is complaining of a lot of pain in the right heel. He is not a diabetic. 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 We have been using Iodoflex to help prepare the surfaces of both wounds for an advanced treatment product. He is nonambulatory spending most of his time in a wheelchair I have asked him not to propel the wheelchair with his heels 1/13; in general his wounds look better not much surface area change we have been using Iodoflex as of last week. I did an x-ray of the right heel as the patient was complaining of pain. I had some thoughts about a stress fracture perhaps Achilles tendon problems however what it showed was erosive changes along the inferior aspect of the calcaneus he now has a MRI booked for 1/20. 1/20; in general his wounds continue to be better. Some improvement in the large narrow areas proximally in his foot. He is still complaining of pain in the right heel and tenderness in certain areas of this wound. His MRI is tonight. I am not just looking for osteomyelitis that was brought up on the x-ray I am wondering about stress  fractures, tendon ruptures etc. He has no such findings on the left. Also noteworthy is that the patient had critical illness neuropathy and some of the discomfort may be actual improvement in nerve function I am just not sure. These wounds were initially in the setting of severe critical illness related to COVID-19. He was put in a standing position. He may have also been on pressors at the point contributing to tissue ischemia. By his description at some point these wounds were grossly necrotic extending proximally up into the Achilles part of his heel. I do not know that I have ever really seen pictures of them like this although they may exist in epic We have ordered Tri layer Oasis. I am trying to stimulate some granulation in these areas. This is of course assuming the MRI is negative for infection 1/27; since the patient was last here he saw Dr. Juleen China of infectious disease. He is planned for vancomycin and ceftriaxone. Prior operative culture grew MSSA. Also ordered baseline lab work. He also ordered arterial studies although the ABIs in our clinic were normal as well as his clinical exam these were normal I do not think he needs to see vascular surgery. His ABIs at the [facility] were 1.22 in the right triphasic waveforms with a normal TBI of 1.15 on the left ABI of 1.22 with triphasic waveforms and a normal TBI of 1.08. Finally he saw Dr. Amalia Hailey who will follow him in for 2 months. At this point I do not think he felt that he needed a procedure on the right calcaneal bone. Dr. Juleen China is elected for broad-spectrum antibiotic The patient is still having pain in the right heel. He walks with a walker 2/3; wounds are generally smaller. He is tolerating his IV antibiotics. I believe this is vancomycin and ceftriaxone. We are still waiting for Oasis burn in terms of his out-of-pocket max which he should be meeting soon given the IV antibiotics, MRIs etc. I have asked him to check in on this. We are  using silver collagen in the meantime the wounds look better 2/10; tolerating IV vancomycin and Rocephin. We  are waiting to apply for Oasis. Although I am not really sure where he is in his out-of-pocket max. 2/17 started the first application of Oasis trilayer. Still on antibiotics. The wounds have generally look better. The area on the left has a little more surface slough requiring debridement 7/01; second application of Oasis trilayer. The wound surface granulation is generally look better. The area on the left with undermining laterally I think is come in a bit. 10/08/2020 upon evaluation today patient is here today for [employer] application #3. Fortunately he seems to be doing extremely well with regard to this and we are seeing a lot of new epithelial growth which is great news. Fortunately there is no signs of active infection at this time. 10/16/2020 upon evaluation today patient appears to be doing well with regard to his foot ulcers. Do believe the Oasis has been of benefit for him. I do not see any signs of infection right now which is great news and I think that he has a lot of new epithelial growth which is great to see as well. The patient is very pleased to hear all of this. I do think we can proceed with the Oasis trilayer #4 today. 3/18; not as much improvement in these areas on his heels that I was hoping. I did reapply trilateral Oasis today the tissue looks healthier but not as much fill in as I was hoping. 3/25; better looking today I think this is come in a bit the tissue looks healthier. Triple layer Oasis reapplied #6 4/1; somewhat better looking definitely better looking surface not as much change in surface area as I was hoping. He may be spending more time [place] on days then he needs to although he does have heel offloading boots. Triple layer Oasis reapplied #7 4/7; unfortunately apparently [facility] will not approve any further Oasis which is unfortunate  since the patient did respond nicely both in terms of the condition of the wound bed as well as surface area. There is still some drainage coming from the wound but not a lot there does not appear to be any infection 4/15; we have been using Hydrofera Blue. He continues to have nice rims of epithelialization on the right greater than the left. The left the epithelialization is coming from the tip of his heel. There is moderate drainage. In this that concerns me about a total contact cast. There is no evidence of infection 4/29; patient has been using Hydrofera Blue with dressing changes. He has no complaints or issues today. 5/5; using Hydrofera Blue. I actually think that he looks marginally better than the last time I saw this 3 weeks ago. There are rims of epithelialization on the left thumb coming from the medial side on the right. Using Hydrofera Blue 5/12; using Hydrofera Blue. These continue to make improvements in surface area. His drainage was not listed as severe I therefore went ahead and put a cast on the left foot. Right foot we will continue to dress his previous 5/16; back for first total contact cast change. He did not tolerate this particularly well cast injury on the anterior tibia among other issues. Difficulty sleeping. I talked him about this in some detail and afterwards is elected to continue. I told him I would like to have a cast on for 3 weeks to see if this is going to help at all. I think he agreed 5/19; I think the wound is better. There is no tunneling  towards his midfoot. The undermining medially also looks better. He has a rim of new skin distally. I think we are making progress here. The area on the left also continues to look somewhat better to me using Hydrofera Blue. He has a list of complaints about the cast but none of them seem serious 5/26; patient presents for 1 week follow-up. He has been using a total contact cast and tolerating this well. Hydrofera Blue is  the main dressing used. He denies signs of infection. 6/2 Hydrofera Blue total contact cast on the left. These were large ulcers that formed in intensive care unit where the patient was recovering from [place]. May have had something to do with being ventilated in an upright positiono Pressors etc. We have been able to get the areas down considerably and a viable surface. There is some epithelialization in both sides. Note made of drainage 6/9; changed to [facility] last time because of drainage. He arrives with better looking surfaces and dimensions on the left than the right. Paradoxically the right actually probes more towards his midfoot the left is largely close down but both of these look improved. Using a total contact cast on the left 6/16; complex wounds on his bilateral plantar heels which were initially pressure injury from a stay in the ICU with COVID. We have been using silver alginate most recently. His dimensions of come in quite dramatically however not recently. We have been putting the left foot in a total contact cast 6/23; complex wounds on the bilateral plantar heels. I been putting the left in the cast paradoxically the area on the right is the one that is going towards closure at a faster rate. Quite a bit of drainage on the left. The patient went to see Dr. Amalia Hailey who said he was going to standby for skin grafts. I had actually considered sending him for skin grafts however he would be mandatorily off his feet for a period of weeks to months. I am thinking that the area on the right is going to close on its own the area on the left has been more stubborn even though we have him in a total contact cast 6/30; took him out of a total contact cast last week is the right heel seem to be making better progress than the left where I was placing the cast. We are using silver alginate. Both wounds are smaller right greater than left 7/12; both wounds look as though they are making  some progress. We are using silver alginate. Heel offloading boots 7/26; very gradual progress especially on the right. Using silver alginate. He is wearing heel offloading boots 8/18; he continues to close these wounds down very gradually. Using silver alginate. The problem polymen being definitive about this is areas of what appears to be callus around the margins. This is not a 100% of the area but certainly sizable especially on the right 9/1; bilateral plantar feet wounds secondary to prolonged pressure while being ventilated for COVID-19 in an upright position. Essentially pressure ulcers on the bottom of his feet. He is made substantial progress using silver alginate. 9/14; bilateral plantar feet wounds secondary to prolonged pressure. Making progress using silver alginate. 9/29 bilateral plantar feet wounds secondary to prolonged pressure. I changed him to Iodoflex last week. MolecuLight showing reddened blush fluorescence 10/11; patient presents for follow-up. He has no issues or complaints today. He denies signs of infection. He continues to use Iodoflex and antibiotic ointment to the wound beds.  10/27; 2-week follow-up. No evidence of infection. He has callus and thick dry skin around the wound margins we have been using Iodoflex and Bactroban which was in response to a moderate left MolecuLight reddish blush fluorescence. 11/10; 2-week follow-up. Wound margins again have thick callus however the measurements of the actual wound sites are a lot smaller. Everything looks reasonably healthy here. We have been using Iodoflex He was approved for prime matrix but I have elected to delay this given the improvement in the surface area. Hopefully I will not regret that decision as were getting close to the end of the year in terms of insurance payment 12/8; 2-week follow-up. Wounds are generally smaller in size. These were initially substantial wounds extending into the forefoot all the way into  the heel on the bilateral plantar feet. They are now both located on the plantar heel distal aspect both of these have a lot of callus around the wounds I used a #5 curette to remove this on the right and the left also some subcutaneous debris to try and get the wound edges were using Iodoflex. He has heel offloading shoe 12/22; 2-week follow-up. Not really much improvement. He has thick callus around the outer edges of both wounds. I remove this there is some nonviable subcutaneous tissue as well. We have been using Iodoflex. Her intake nurse and myself spontaneously thought of a total contact cast I went back in May. At that time we really were not seeing much of an improvement with a cast although the wound was in a much different situation I would like to retry this in 2 weeks and I discussed this with the patient 08/12/2021; the patient has had some improvement with the Iodoflex. The the area on the left heel plantar more improved than the right. I had to put him in a total contact cast on the left although I decided to put that off for 2 weeks. I am going to change his primary dressing to silver collagen. I think in both areas he has had some improvement most of the healing seems to be more proximal in the heel. The wounds are in the mid aspect. A lot of thick callus on the right heel however. 1/19; we are using silver collagen on both plantar heel areas. He has had some improvement today. The left did not require any debridement. He still had some eschar on the right that was debrided but both seem to have contracted. I did not put it total contact cast on him today 2/2 we have been using silver collagen. The area on the right plantar heel has areas that appear to be epithelialized interspersed with dry flaking callus and dry skin. I removed this. This really looks better than on the other side. On the left still a large area with raised edges and debris on the surface. The patient states he is  in the heel offloading boots for a prolonged period of time and really does not use any other footwear 2/6; patient presents for first cast exchange. He has no issues or complaints today. 2/9; not much change in the left foot wound with 1 week of a cast we are using silver collagen. Silver collagen on the right side. The right side has been the better wound surface. We will reapply the total contact cast on the left 2/16; not much improvement on either side I been using silver collagen with a total contact cast on the left. I'm changing the Hydrofera Blue still  with a total contact cast on the left 2/23; some improvement on both sides. Disappointing that he has thick callus around the area that we are putting in a total contact cast on the left. We've been using Hydrofera Blue on both wound areas. This is a man who at essentially pressure ulcers in addition to ischemia caused by medications to support his blood pressure (pressors) in the ICU. He was being ventilated in the standing position for severe Covid. A Shiley the wounds extended across his entire foot but are now localized to his plantar heels bilaterally. We have made progress however neither areas healed. I continue to think the total contact cast is helped albeit painstakingly slowly. He has never wanted a plastic surgery consult although I don't know that they would be interested in grafting in area in this location. 10/07/2021: Continued improvement bilaterally. There is still some callus around the left wound, despite the total contact cast. He has some increased pain in his right midfoot around 1 particular area. This has been painful in the past but seems to be a little bit worse. When his cast was removed today, there was an area on the heel of the left foot that looks a bit macerated. He is also complaining of pain in his left thigh and hip which he thinks is secondary to the limb length discrepancy caused by the cast. 10/14/2021: He  continues to improve. A little bit less callus around the left wound. He continues to endorse pain in his right midfoot, but this is not as significant as it was last week. The maceration on his left heel is improved. 10/21/2021: Continued improvement to both wounds. The maceration on his left heel is no longer evident. Less callus bilaterally. Epithelialization progressing. 10/28/2021: Significant improvement this week. The right sided wound is nearly closed with just a small open area at the middle. No maceration seen on the left heel. Continued epithelialization on both sides. No concern for infection. 11/04/2021: T oday, the wounds were measured a little bit differently and come out as larger, but I actually think they are about the same to potentially even smaller, particularly on the left. He continues to accumulate some callus on the right. 11/11/2021: T oday, the patient is expressing some concern that the left wound, despite being in the total contact cast, is not progressing at the same rate as the 1 on the right. He is interested in trying a week without the cast to see how the wound does. The wounds are roughly the same size as last week, with the right perhaps being a little bit smaller. He continues to build up callus on both sites. 11/18/2021: Last week, I permitted the patient to go without his total contact cast, just to see if the cast was really making any difference. Today, both wounds have deteriorated to some extent, suggesting that the cast is providing benefit, at least on the left. Both are larger and have accumulated callus, slough, and other debris. 11/26/2021: I debrided both wounds quite aggressively last week in an effort to stimulate the healing cascade. This appears to have been effective as the left sided wound is a full centimeter shorter in length. Although the right was measured slightly larger, on inspection, it looks as though an area of epithelialized tissue was  included in the measurements. We have been using PolyMem Ag on the wound surfaces with a total contact cast to the left. 12/02/2021: It appears that the intake personnel are including epithelialized  tissue in his wound measurements; the right wound is almost completely epithelialized; there is just a crater at the proximal midfoot with some open areas. On the left, he has built up some callus, but the overall wound surface looks good. There is some senescent skin around the wound margin. He has been in PolyMem Ag bilaterally with a total contact cast on the left. 12/09/2021: The right wound is nearly closed; there is just a small open area at the mid calcaneus. On the left, the wound is smaller with minimal callus buildup. No significant drainage. 12/16/2021: The right calcaneal wound remains minimally open at the mid calcaneus; the rest has epithelialized. On the left, the wound is also a little bit smaller. There is some senescent tissue on the periphery. He is getting his first application of a trial skin substitute called Vendaje today. 12/23/2021: The wound on his right calcaneus is nearly closed; there is just a small area at the most distal aspect of the calcaneus that is open. On the left, the area where we applied to the skin substitute has a healthier-looking bed of granulation tissue. The wound dimensions are not significantly different on this side but the wound surface is improved. Electronic Signature(s) Signed: 12/23/2021 11:06:12 AM By: Fredirick Maudlin MD FACS Entered By: Fredirick Maudlin on 12/23/2021 11:06:12 -------------------------------------------------------------------------------- Physical Exam Details Patient Name: Date of Service: [name], [place] 12/23/2021 9:30 A M Medical Record Number: [id number] Patient Account Number: [id number] Date of Birth/Sex: Treating RN: [birth year] (46 y.o. [name] Primary Care Provider: [name] Other Clinician: Referring  Provider: Treating Provider/Extender: [name] in Treatment: 62 Constitutional Slightly hypertensive. . . . No acute distress. Respiratory Normal work of breathing on room air. Notes 12/23/2021: The wound on his right calcaneus is nearly closed; there is just a small area at the most distal aspect of the calcaneus that is open. On the left, the area where we applied to the skin substitute has a healthier-looking bed of granulation tissue. The wound dimensions are not significantly different on this side but the wound surface is improved. Electronic Signature(s) Signed: 12/23/2021 11:07:07 AM By: Fredirick Maudlin MD FACS Entered By: Fredirick Maudlin on 12/23/2021 11:07:07 -------------------------------------------------------------------------------- Physician Orders Details Patient Name: Date of Service: [name], [place] 12/23/2021 9:30 A M Medical Record Number: [id number] Patient Account Number: [id number] Date of Birth/Sex: Treating RN: July 12, 1976 (46 y.o. [name] Primary Care Provider: [name] Other Clinician: Referring Provider: Treating Provider/Extender: [name] in Treatment: 72 Verbal / Phone Orders: No Diagnosis Coding ICD-10 Coding Code Description [phone] Non-pressure chronic ulcer of other part of right foot with fat layer exposed L97.522 Non-pressure chronic ulcer of other part of left foot with fat layer exposed Follow-up Appointments ppointment in 1 week. - Dr [name] - Room 3 - Thursday 5/25 at 9:30 Return A Cellular or Tissue Based Products Cellular or Tissue Based Product Type: - Vendaje #2 (trial) Cellular or Tissue Based Product applied to wound bed, secured with steri-strips, cover with Adaptic or Mepitel. (DO NOT REMOVE). Bathing/ Shower/ Hygiene May shower with protection but do not get wound dressing(s) wet. - Cover with cast protector (can purchase cast protector at [facility] or  [facility] ) Edema Control - Lymphedema / SCD / Other Bilateral Lower Extremities Elevate legs to the level of the heart or above for 30 minutes daily and/or when sitting, a frequency of: - throughout the day Avoid standing for long periods of  time. Moisturize legs daily. - right leg and foot every night. Off-Loading Total Contact Cast to Left Lower Extremity Wedge shoe to: - Globoped Shoe to right foot and left foot when not in TCC Other: - keep pressure off of the bottom of your feet Additional Orders / Instructions Follow Nutritious Diet Wound Treatment Wound #1 - Calcaneus Wound Laterality: Right Cleanser: Normal Saline (Generic) Every Other Day/30 Days Discharge Instructions: Cleanse the wound with Normal Saline prior to applying a clean dressing using gauze sponges, not tissue or cotton balls. Cleanser: Wound Cleanser (Generic) Every Other Day/30 Days Discharge Instructions: Cleanse the wound with wound cleanser prior to applying a clean dressing using gauze sponges, not tissue or cotton balls. Prim Dressing: PolyMem Silver Non-Adhesive Dressing, 4.25x4.25 in (Generic) Every Other Day/30 Days ary Discharge Instructions: Apply to wound bed as instructed Secondary Dressing: Zetuvit Plus 4x8 in (Generic) Every Other Day/30 Days Discharge Instructions: Apply over primary dressing as directed. Secured With: Elastic Bandage 4 inch (ACE bandage) (Generic) Every Other Day/30 Days Discharge Instructions: Secure with ACE bandage as directed. Secured With: [employer], 4.5x3.1 (in/yd) (Generic) Every Other Day/30 Days Discharge Instructions: Secure with Kerlix as directed. Wound #2 - Calcaneus Wound Laterality: Left Cleanser: Normal Saline (Generic) 1 x Per Week/30 Days Discharge Instructions: Cleanse the wound with Normal Saline prior to applying a clean dressing using gauze sponges, not tissue or cotton balls. Cleanser: Wound Cleanser 1 x Per Week/30 Days Discharge Instructions:  Cleanse the wound with wound cleanser prior to applying a clean dressing using gauze sponges, not tissue or cotton balls. Peri-Wound Care: Zinc Oxide Ointment 30g tube 1 x Per Week/30 Days Discharge Instructions: Apply Zinc Oxide to periwound with each dressing change Prim Dressing: Vendaje ary 1 x Per Week/30 Days Secondary Dressing: Zetuvit Plus 4x8 in (Generic) 1 x Per Week/30 Days Discharge Instructions: Apply over primary dressing as directed. Secured With: [employer], 4.5x3.1 (in/yd) (Generic) 1 x Per Week/30 Days Discharge Instructions: Secure with Kerlix as directed. Secured With: 23M Medipore H Soft Cloth Surgical T ape, 4 x 10 (in/yd) (Generic) 1 x Per Week/30 Days Discharge Instructions: Secure with tape as directed. Electronic Signature(s) Signed: 12/23/2021 12:01:39 PM By: Fredirick Maudlin MD FACS Entered By: Fredirick Maudlin on 12/23/2021 11:07:26 -------------------------------------------------------------------------------- Problem List Details Patient Name: Date of Service: [name], [place] 12/23/2021 9:30 A M Medical Record Number: [id number] Patient Account Number: [id number] Date of Birth/Sex: Treating RN: 02-12-1976 (46 y.o. [name] Primary Care Provider: [name] Other Clinician: Referring Provider: Treating Provider/Extender: [name] in Treatment: 44 Active Problems ICD-10 Encounter Code Description Active Date MDM Diagnosis [id number] Non-pressure chronic ulcer of other part of right foot with fat layer exposed 09/03/2020 No Yes L97.522 Non-pressure chronic ulcer of other part of left foot with fat layer exposed 09/03/2020 No Yes Inactive Problems ICD-10 Code Description Active Date Inactive Date L89.893 Pressure ulcer of other site, stage 3 07/15/2020 07/15/2020 M62.81 Muscle weakness (generalized) 07/15/2020 07/15/2020 I10 Essential (primary) hypertension 07/15/2020 07/15/2020 M86.171 Other acute osteomyelitis,  right ankle and foot 09/03/2020 09/03/2020 Resolved Problems Electronic Signature(s) Signed: 12/23/2021 11:04:14 AM By: Fredirick Maudlin MD FACS Entered By: Fredirick Maudlin on 12/23/2021 11:04:14 -------------------------------------------------------------------------------- Progress Note Details Patient Name: Date of Service: [name], [place] 12/23/2021 9:30 A M Medical Record Number: [id number] Patient Account Number: [id number] Date of Birth/Sex: Treating RN: [birth year] (46 y.o. [name] Primary Care Provider: [name] Other Clinician: Referring Provider: Treating Provider/Extender: [name]  Philis Nettle, Mickel Baas Weeks in Treatment: 75 Subjective Chief Complaint Information obtained from Patient Bilateral Plantar Foot Ulcers History of Present Illness (HPI) Wounds are12/03/2020 upon evaluation today patient presents for initial inspection here in our clinic concerning issues he has been having with the bottoms of his feet bilaterally. He states these actually occurred as wounds when he was hospitalized for 5 months secondary to Covid. He was apparently with tilting bed where he was in an upright position quite frequently and apparently this occurred in some way shape or form during that time. Fortunately there is no sign of active infection at this time. No fevers, chills, nausea, vomiting, or diarrhea. With that being said he still has substantial wounds on the plantar aspects of his feet Theragen require quite a bit of work to get these to heal. He has been using Santyl currently though that is been problematic both in receiving the medication as well as actually paid for it as it is become quite expensive. Prior to the experience with Covid the patient really did not have any major medical problems other than hypertension he does have some mild generalized weakness following the Covid experience. 07/22/2020 on evaluation today patient appears to be doing okay in regard to  his foot ulcers I feel like the wound beds are showing signs of better improvement that I do believe the Iodoflex is helping in this regard. With that being said he does have a lot of drainage currently and this is somewhat blue/green in nature which is consistent with Pseudomonas. I do think a culture today would be appropriate for [place] to evaluate and see if that is indeed the case I would likely start him on antibiotic orally as well he is not allergic to Cipro knows of no issues he has had in the past 12/21; patient was admitted to the clinic earlier this month with bilateral presumed pressure ulcers on the bottom of his feet apparently related to excessive pressure from a tilt table arrangement in the intensive care unit. Patient relates this to being on ECMO but I am not really sure that is exactly related to that. I must say I have never seen anything like this. He has fairly extensive full-thickness wounds extending from his heel towards his midfoot mostly centered laterally. There is already been some healing distally. He does not appear to have an arterial issue. He has been using gentamicin to the wound surfaces with Iodoflex to help with ongoing debridement 1/6; this is a patient with pressure ulcers on the bottom of his feet related to excessive pressure from a standing position in the intensive care unit. He is complaining of a lot of pain in the right heel. He is not a diabetic. 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 We have been using Iodoflex to help prepare the surfaces of both wounds for an advanced treatment product. He is nonambulatory spending most of his time in a wheelchair I have asked him not to propel the wheelchair with his heels 1/13; in general his wounds look better not much surface area change we have been using Iodoflex as of last week. I did an x-ray of the right heel as the patient was complaining of pain. I had some thoughts about a stress  fracture perhaps Achilles tendon problems however what it showed was erosive changes along the inferior aspect of the calcaneus he now has a MRI booked for 1/20. 1/20; in general his wounds continue to be better. Some  improvement in the large narrow areas proximally in his foot. He is still complaining of pain in the right heel and tenderness in certain areas of this wound. His MRI is tonight. I am not just looking for osteomyelitis that was brought up on the x-ray I am wondering about stress fractures, tendon ruptures etc. He has no such findings on the left. Also noteworthy is that the patient had critical illness neuropathy and some of the discomfort may be actual improvement in nerve function I am just not sure. These wounds were initially in the setting of severe critical illness related to COVID-19. He was put in a standing position. He may have also been on pressors at the point contributing to tissue ischemia. By his description at some point these wounds were grossly necrotic extending proximally up into the Achilles part of his heel. I do not know that I have ever really seen pictures of them like this although they may exist in epic We have ordered Tri layer Oasis. I am trying to stimulate some granulation in these areas. This is of course assuming the MRI is negative for infection 1/27; since the patient was last here he saw Dr. Juleen China of infectious disease. He is planned for vancomycin and ceftriaxone. Prior operative culture grew MSSA. Also ordered baseline lab work. He also ordered arterial studies although the ABIs in our clinic were normal as well as his clinical exam these were normal I do not think he needs to see vascular surgery. His ABIs at the [facility] were 1.22 in the right triphasic waveforms with a normal TBI of 1.15 on the left ABI of 1.22 with triphasic waveforms and a normal TBI of 1.08. Finally he saw Dr. Amalia Hailey who will follow him in for 2 months. At this point I do not think  he felt that he needed a procedure on the right calcaneal bone. Dr. Juleen China is elected for broad-spectrum antibiotic The patient is still having pain in the right heel. He walks with a walker 2/3; wounds are generally smaller. He is tolerating his IV antibiotics. I believe this is vancomycin and ceftriaxone. We are still waiting for Oasis burn in terms of his out-of-pocket max which he should be meeting soon given the IV antibiotics, MRIs etc. I have asked him to check in on this. We are using silver collagen in the meantime the wounds look better 2/10; tolerating IV vancomycin and Rocephin. We are waiting to apply for Oasis. Although I am not really sure where he is in his out-of-pocket max. 2/17 started the first application of Oasis trilayer. Still on antibiotics. The wounds have generally look better. The area on the left has a little more surface slough requiring debridement 7/25; second application of Oasis trilayer. The wound surface granulation is generally look better. The area on the left with undermining laterally I think is come in a bit. 10/08/2020 upon evaluation today patient is here today for [employer] application #3. Fortunately he seems to be doing extremely well with regard to this and we are seeing a lot of new epithelial growth which is great news. Fortunately there is no signs of active infection at this time. 10/16/2020 upon evaluation today patient appears to be doing well with regard to his foot ulcers. Do believe the Oasis has been of benefit for him. I do not see any signs of infection right now which is great news and I think that he has a lot of new epithelial growth which is great to  see as well. The patient is very pleased to hear all of this. I do think we can proceed with the Oasis trilayer #4 today. 3/18; not as much improvement in these areas on his heels that I was hoping. I did reapply trilateral Oasis today the tissue looks healthier but not as much fill in as  I was hoping. 3/25; better looking today I think this is come in a bit the tissue looks healthier. Triple layer Oasis reapplied #6 4/1; somewhat better looking definitely better looking surface not as much change in surface area as I was hoping. He may be spending more time [place] on days then he needs to although he does have heel offloading boots. Triple layer Oasis reapplied #7 4/7; unfortunately apparently [facility] will not approve any further Oasis which is unfortunate since the patient did respond nicely both in terms of the condition of the wound bed as well as surface area. There is still some drainage coming from the wound but not a lot there does not appear to be any infection 4/15; we have been using Hydrofera Blue. He continues to have nice rims of epithelialization on the right greater than the left. The left the epithelialization is coming from the tip of his heel. There is moderate drainage. In this that concerns me about a total contact cast. There is no evidence of infection 4/29; patient has been using Hydrofera Blue with dressing changes. He has no complaints or issues today. 5/5; using Hydrofera Blue. I actually think that he looks marginally better than the last time I saw this 3 weeks ago. There are rims of epithelialization on the left thumb coming from the medial side on the right. Using Hydrofera Blue 5/12; using Hydrofera Blue. These continue to make improvements in surface area. His drainage was not listed as severe I therefore went ahead and put a cast on the left foot. Right foot we will continue to dress his previous 5/16; back for first total contact cast change. He did not tolerate this particularly well cast injury on the anterior tibia among other issues. Difficulty sleeping. I talked him about this in some detail and afterwards is elected to continue. I told him I would like to have a cast on for 3 weeks to see if this is going to help at all. I think  he agreed 5/19; I think the wound is better. There is no tunneling towards his midfoot. The undermining medially also looks better. He has a rim of new skin distally. I think we are making progress here. The area on the left also continues to look somewhat better to me using Hydrofera Blue. He has a list of complaints about the cast but none of them seem serious 5/26; patient presents for 1 week follow-up. He has been using a total contact cast and tolerating this well. Hydrofera Blue is the main dressing used. He denies signs of infection. 6/2 Hydrofera Blue total contact cast on the left. These were large ulcers that formed in intensive care unit where the patient was recovering from [place]. May have had something to do with being ventilated in an upright positiono Pressors etc. We have been able to get the areas down considerably and a viable surface. There is some epithelialization in both sides. Note made of drainage 6/9; changed to [facility] last time because of drainage. He arrives with better looking surfaces and dimensions on the left than the right. Paradoxically the right actually probes more towards  his midfoot the left is largely close down but both of these look improved. Using a total contact cast on the left 6/16; complex wounds on his bilateral plantar heels which were initially pressure injury from a stay in the ICU with COVID. We have been using silver alginate most recently. His dimensions of come in quite dramatically however not recently. We have been putting the left foot in a total contact cast 6/23; complex wounds on the bilateral plantar heels. I been putting the left in the cast paradoxically the area on the right is the one that is going towards closure at a faster rate. Quite a bit of drainage on the left. The patient went to see Dr. Amalia Hailey who said he was going to standby for skin grafts. I had actually considered sending him for skin grafts however he would  be mandatorily off his feet for a period of weeks to months. I am thinking that the area on the right is going to close on its own the area on the left has been more stubborn even though we have him in a total contact cast 6/30; took him out of a total contact cast last week is the right heel seem to be making better progress than the left where I was placing the cast. We are using silver alginate. Both wounds are smaller right greater than left 7/12; both wounds look as though they are making some progress. We are using silver alginate. Heel offloading boots 7/26; very gradual progress especially on the right. Using silver alginate. He is wearing heel offloading boots 8/18; he continues to close these wounds down very gradually. Using silver alginate. The problem polymen being definitive about this is areas of what appears to be callus around the margins. This is not a 100% of the area but certainly sizable especially on the right 9/1; bilateral plantar feet wounds secondary to prolonged pressure while being ventilated for COVID-19 in an upright position. Essentially pressure ulcers on the bottom of his feet. He is made substantial progress using silver alginate. 9/14; bilateral plantar feet wounds secondary to prolonged pressure. Making progress using silver alginate. 9/29 bilateral plantar feet wounds secondary to prolonged pressure. I changed him to Iodoflex last week. MolecuLight showing reddened blush fluorescence 10/11; patient presents for follow-up. He has no issues or complaints today. He denies signs of infection. He continues to use Iodoflex and antibiotic ointment to the wound beds. 10/27; 2-week follow-up. No evidence of infection. He has callus and thick dry skin around the wound margins we have been using Iodoflex and Bactroban which was in response to a moderate left MolecuLight reddish blush fluorescence. 11/10; 2-week follow-up. Wound margins again have thick callus however the  measurements of the actual wound sites are a lot smaller. Everything looks reasonably healthy here. We have been using Iodoflex He was approved for prime matrix but I have elected to delay this given the improvement in the surface area. Hopefully I will not regret that decision as were getting close to the end of the year in terms of insurance payment 12/8; 2-week follow-up. Wounds are generally smaller in size. These were initially substantial wounds extending into the forefoot all the way into the heel on the bilateral plantar feet. They are now both located on the plantar heel distal aspect both of these have a lot of callus around the wounds I used a #5 curette to remove this on the right and the left also some subcutaneous debris to try and get  the wound edges were using Iodoflex. He has heel offloading shoe 12/22; 2-week follow-up. Not really much improvement. He has thick callus around the outer edges of both wounds. I remove this there is some nonviable subcutaneous tissue as well. We have been using Iodoflex. Her intake nurse and myself spontaneously thought of a total contact cast I went back in May. At that time we really were not seeing much of an improvement with a cast although the wound was in a much different situation I would like to retry this in 2 weeks and I discussed this with the patient 08/12/2021; the patient has had some improvement with the Iodoflex. The the area on the left heel plantar more improved than the right. I had to put him in a total contact cast on the left although I decided to put that off for 2 weeks. I am going to change his primary dressing to silver collagen. I think in both areas he has had some improvement most of the healing seems to be more proximal in the heel. The wounds are in the mid aspect. A lot of thick callus on the right heel however. 1/19; we are using silver collagen on both plantar heel areas. He has had some improvement today. The left did not  require any debridement. He still had some eschar on the right that was debrided but both seem to have contracted. I did not put it total contact cast on him today 2/2 we have been using silver collagen. The area on the right plantar heel has areas that appear to be epithelialized interspersed with dry flaking callus and dry skin. I removed this. This really looks better than on the other side. On the left still a large area with raised edges and debris on the surface. 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 He has no issues or complaints today. 2/9; not much change in the left foot wound with 1 week of a cast we are using silver collagen. Silver collagen on the right side. The right side has been the better wound surface. We will reapply the total contact cast on the left 2/16; not much improvement on either side I been using silver collagen with a total contact cast on the left. I'm changing the Hydrofera Blue still with a total contact cast on the left 2/23; some improvement on both sides. Disappointing that he has thick callus around the area that we are putting in a total contact cast on the left. We've been using Hydrofera Blue on both wound areas. This is a man who at essentially pressure ulcers in addition to ischemia caused by medications to support his blood pressure (pressors) in the ICU. He was being ventilated in the standing position for severe Covid. A Shiley the wounds extended across his entire foot but are now localized to his plantar heels bilaterally. We have made progress however neither areas healed. I continue to think the total contact cast is helped albeit painstakingly slowly. He has never wanted a plastic surgery consult although I don't know that they would be interested in grafting in area in this location. 10/07/2021: Continued improvement  bilaterally. There is still some callus around the left wound, despite the total contact cast. He has some increased pain in his right midfoot around 1 particular area. This has been painful in the past but seems  to be a little bit worse. When his cast was removed today, there was an area on the heel of the left foot that looks a bit macerated. He is also complaining of pain in his left thigh and hip which he thinks is secondary to the limb length discrepancy caused by the cast. 10/14/2021: He continues to improve. A little bit less callus around the left wound. He continues to endorse pain in his right midfoot, but this is not as significant as it was last week. The maceration on his left heel is improved. 10/21/2021: Continued improvement to both wounds. The maceration on his left heel is no longer evident. Less callus bilaterally. Epithelialization progressing. 10/28/2021: Significant improvement this week. The right sided wound is nearly closed with just a small open area at the middle. No maceration seen on the left heel. Continued epithelialization on both sides. No concern for infection. 11/04/2021: T oday, the wounds were measured a little bit differently and come out as larger, but I actually think they are about the same to potentially even smaller, particularly on the left. He continues to accumulate some callus on the right. 11/11/2021: T oday, the patient is expressing some concern that the left wound, despite being in the total contact cast, is not progressing at the same rate as the 1 on the right. He is interested in trying a week without the cast to see how the wound does. The wounds are roughly the same size as last week, with the right perhaps being a little bit smaller. He continues to build up callus on both sites. 11/18/2021: Last week, I permitted the patient to go without his total contact cast, just to see if the cast was really making any difference. Today, both wounds have  deteriorated to some extent, suggesting that the cast is providing benefit, at least on the left. Both are larger and have accumulated callus, slough, and other debris. 11/26/2021: I debrided both wounds quite aggressively last week in an effort to stimulate the healing cascade. This appears to have been effective as the left sided wound is a full centimeter shorter in length. Although the right was measured slightly larger, on inspection, it looks as though an area of epithelialized tissue was included in the measurements. We have been using PolyMem Ag on the wound surfaces with a total contact cast to the left. 12/02/2021: It appears that the intake personnel are including epithelialized tissue in his wound measurements; the right wound is almost completely epithelialized; there is just a crater at the proximal midfoot with some open areas. On the left, he has built up some callus, but the overall wound surface looks good. There is some senescent skin around the wound margin. He has been in PolyMem Ag bilaterally with a total contact cast on the left. 12/09/2021: The right wound is nearly closed; there is just a small open area at the mid calcaneus. On the left, the wound is smaller with minimal callus buildup. No significant drainage. 12/16/2021: The right calcaneal wound remains minimally open at the mid calcaneus; the rest has epithelialized. On the left, the wound is also a little bit smaller. There is some senescent tissue on the periphery. He is getting his first application of a trial skin substitute called Vendaje today. 12/23/2021: The wound on his right calcaneus is nearly closed; there is just a small area at the most distal aspect of the calcaneus that is open. On the left, the area where we applied to the skin  substitute has a healthier-looking bed of granulation tissue. The wound dimensions are not significantly different on this side but the wound surface is improved. Patient  History Information obtained from Patient. Family History Cancer - Maternal Grandparents, Diabetes - Father,Paternal Grandparents, Heart Disease - Maternal Grandparents, Hypertension - Father,Paternal Grandparents, Lung Disease - Siblings, No family history of Hereditary Spherocytosis, Kidney Disease, Seizures, Stroke, Thyroid Problems, Tuberculosis. Social History Never smoker, Marital Status - Married, Alcohol Use - Never, Drug Use - No History, Caffeine Use - Daily - tea, soda. Medical History Eyes Denies history of Cataracts, Glaucoma, Optic Neuritis Ear/Nose/Mouth/Throat Denies history of Chronic sinus problems/congestion, Middle ear problems Hematologic/Lymphatic Denies history of Anemia, Hemophilia, Human Immunodeficiency Virus, Lymphedema, Sickle Cell Disease Respiratory Patient has history of Asthma Denies history of Aspiration, Chronic Obstructive Pulmonary Disease (COPD), Pneumothorax, Sleep Apnea, Tuberculosis Cardiovascular Patient has history of Angina - with COVID, Hypertension Denies history of Arrhythmia, Congestive Heart Failure, Coronary Artery Disease, Deep Vein Thrombosis, Hypotension, Myocardial Infarction, Peripheral Arterial Disease, Peripheral Venous Disease, Phlebitis, Vasculitis Gastrointestinal Denies history of Cirrhosis , Colitis, Crohnoos, Hepatitis A, Hepatitis B, Hepatitis C Endocrine Denies history of Type I Diabetes, Type II Diabetes Genitourinary Denies history of End Stage Renal Disease Immunological Denies history of Lupus Erythematosus, Raynaudoos, Scleroderma Integumentary (Skin) Denies history of History of Burn Musculoskeletal Denies history of Gout, Rheumatoid Arthritis, Osteoarthritis, Osteomyelitis Neurologic Denies history of Dementia, Neuropathy, Quadriplegia, Paraplegia, Seizure Disorder Oncologic Denies history of Received Chemotherapy, Received Radiation Psychiatric Denies history of Anorexia/bulimia, Confinement  Anxiety Hospitalization/Surgery History - COVID PNA 07/22/2019- 11/14/2019. - 03/27/2020 wound debridement/ skin graft. Medical A Surgical History Notes nd Constitutional Symptoms (General Health) COVID PNA 07/22/2019-11/14/2019 [facility] ECMO, foot drop left foot , Genitourinary kidney stone Psychiatric anxiety Objective Constitutional Slightly hypertensive. No acute distress. Vitals Time Taken: 9:54 AM, Height: 69 in, Weight: 280 lbs, BMI: 41.3, Temperature: 98.2 F, Pulse: 75 bpm, Respiratory Rate: 20 breaths/min, Blood Pressure: 152/86 mmHg. Respiratory Normal work of breathing on room air. General Notes: 12/23/2021: The wound on his right calcaneus is nearly closed; there is just a small area at the most distal aspect of the calcaneus that is open. On the left, the area where we applied to the skin substitute has a healthier-looking bed of granulation tissue. The wound dimensions are not significantly different on this side but the wound surface is improved. Integumentary (Hair, Skin) Wound #1 status is Open. Original cause of wound was Pressure Injury. The date acquired was: 10/07/2019. The wound has been in treatment 75 weeks. The wound is located on the Right Calcaneus. The wound measures 1cm length x 0.4cm width x 0.3cm depth; 0.314cm^2 area and 0.094cm^3 volume. There is Fat Layer (Subcutaneous Tissue) exposed. There is no tunneling or undermining noted. There is a medium amount of serosanguineous drainage noted. The wound margin is distinct with the outline attached to the wound base. There is large (67-100%) red, pink granulation within the wound bed. There is a small (1-33%) amount of necrotic tissue within the wound bed including Adherent Slough. Wound #2 status is Open. Original cause of wound was Pressure Injury. The date acquired was: 10/07/2019. The wound has been in treatment 75 weeks. The wound is located on the Left Calcaneus. The wound measures 2.5cm length x 1cm width x 0.2cm  depth; 1.963cm^2 area and 0.393cm^3 volume. There is Fat Layer (Subcutaneous Tissue) exposed. There is no tunneling or undermining noted. There is a medium amount of serosanguineous drainage noted. The wound margin is distinct with the outline  attached to the wound base. There is medium (34-66%) red, pink granulation within the wound bed. There is a medium (34-66%) amount of necrotic tissue within the wound bed including Adherent Slough. Assessment Active Problems ICD-10 Non-pressure chronic ulcer of other part of right foot with fat layer exposed Non-pressure chronic ulcer of other part of left foot with fat layer exposed Procedures Wound #1 Pre-procedure diagnosis of Wound #1 is a Pressure Ulcer located on the Right Calcaneus . There was a Selective/Open Wound Non-Viable Tissue Debridement with a total area of 0.4 sq cm performed by Fredirick Maudlin, MD. With the following instrument(s): Curette to remove Non-Viable tissue/material. Material removed includes Callus. No specimens were taken. A time out was conducted at 10:28, prior to the start of the procedure. A Minimum amount of bleeding was controlled with Pressure. The procedure was tolerated well with a pain level of 0 throughout and a pain level of 0 following the procedure. Post Debridement Measurements: 1cm length x 0.4cm width x 0.3cm depth; 0.094cm^3 volume. Post debridement Stage noted as Category/Stage III. Character of Wound/Ulcer Post Debridement is improved. Post procedure Diagnosis Wound #1: Same as Pre-Procedure Wound #2 Pre-procedure diagnosis of Wound #2 is a Pressure Ulcer located on the Left Calcaneus . There was a Selective/Open Wound Non-Viable Tissue Debridement with a total area of 2.5 sq cm performed by Fredirick Maudlin, MD. With the following instrument(s): Curette to remove Non-Viable tissue/material. Material removed includes Callus. No specimens were taken. A time out was conducted at 10:28, prior to the start of  the procedure. A Minimum amount of bleeding was controlled with Pressure. The procedure was tolerated well with a pain level of 0 throughout and a pain level of 0 following the procedure. Post Debridement Measurements: 2.5cm length x 1cm width x 0.2cm depth; 0.393cm^3 volume. Post debridement Stage noted as Category/Stage III. Character of Wound/Ulcer Post Debridement is improved. Post procedure Diagnosis Wound #2: Same as Pre-Procedure Pre-procedure diagnosis of Wound #2 is a Pressure Ulcer located on the Left Calcaneus. A skin graft procedure using a bioengineered skin substitute/cellular or tissue based product was performed by Fredirick Maudlin, MD with the following instrument(s): Forceps. Other was applied and secured with Steri-Strips. 2 sq cm of product was utilized and 0 sq cm was wasted. Post Application, gauze, kerlix was applied. A Time Out was conducted at 10:35, prior to the start of the procedure. The procedure was tolerated well with a pain level of 0 throughout and a pain level of 0 following the procedure. Post procedure Diagnosis Wound #2: Same as Pre-Procedure . Pre-procedure diagnosis of Wound #2 is a Pressure Ulcer located on the Left Calcaneus . There was a T Contact Cast Procedure by Fredirick Maudlin, MD. otal Post procedure Diagnosis Wound #2: Same as Pre-Procedure Plan Follow-up Appointments: Return Appointment in 1 week. - Dr [name] - Room 3 - Thursday 5/25 at 9:30 Cellular or Tissue Based Products: Cellular or Tissue Based Product Type: - Vendaje #2 (trial) Cellular or Tissue Based Product applied to wound bed, secured with steri-strips, cover with Adaptic or Mepitel. (DO NOT REMOVE). Bathing/ Shower/ Hygiene: May shower with protection but do not get wound dressing(s) wet. - Cover with cast protector (can purchase cast protector at [facility] or [facility] ) Edema Control - Lymphedema / SCD / Other: Elevate legs to the level of the heart or above for 30 minutes daily and/or  when sitting, a frequency of: - throughout the day Avoid standing for long periods of time. Moisturize legs daily. -  right leg and foot every night. Off-Loading: T Contact Cast to Left Lower Extremity otal Wedge shoe to: - Globoped Shoe to right foot and left foot when not in TCC Other: - keep pressure off of the bottom of your feet Additional Orders / Instructions: Follow Nutritious Diet WOUND #1: - Calcaneus Wound Laterality: Right Cleanser: Normal Saline (Generic) Every Other Day/30 Days Discharge Instructions: Cleanse the wound with Normal Saline prior to applying a clean dressing using gauze sponges, not tissue or cotton balls. Cleanser: Wound Cleanser (Generic) Every Other Day/30 Days Discharge Instructions: Cleanse the wound with wound cleanser prior to applying a clean dressing using gauze sponges, not tissue or cotton balls. Prim Dressing: PolyMem Silver Non-Adhesive Dressing, 4.25x4.25 in (Generic) Every Other Day/30 Days ary Discharge Instructions: Apply to wound bed as instructed Secondary Dressing: Zetuvit Plus 4x8 in (Generic) Every Other Day/30 Days Discharge Instructions: Apply over primary dressing as directed. Secured With: Elastic Bandage 4 inch (ACE bandage) (Generic) Every Other Day/30 Days Discharge Instructions: Secure with ACE bandage as directed. Secured With: [employer], 4.5x3.1 (in/yd) (Generic) Every Other Day/30 Days Discharge Instructions: Secure with Kerlix as directed. WOUND #2: - Calcaneus Wound Laterality: Left Cleanser: Normal Saline (Generic) 1 x Per Week/30 Days Discharge Instructions: Cleanse the wound with Normal Saline prior to applying a clean dressing using gauze sponges, not tissue or cotton balls. Cleanser: Wound Cleanser 1 x Per Week/30 Days Discharge Instructions: Cleanse the wound with wound cleanser prior to applying a clean dressing using gauze sponges, not tissue or cotton balls. Peri-Wound Care: Zinc Oxide Ointment 30g tube 1 x  Per Week/30 Days Discharge Instructions: Apply Zinc Oxide to periwound with each dressing change Prim Dressing: Vendaje 1 x Per Week/30 Days ary Secondary Dressing: Zetuvit Plus 4x8 in (Generic) 1 x Per Week/30 Days Discharge Instructions: Apply over primary dressing as directed. Secured With: [employer], 4.5x3.1 (in/yd) (Generic) 1 x Per Week/30 Days Discharge Instructions: Secure with Kerlix as directed. Secured With: 23M Medipore H Soft Cloth Surgical T ape, 4 x 10 (in/yd) (Generic) 1 x Per Week/30 Days Discharge Instructions: Secure with tape as directed. 12/23/2021: The wound on his right calcaneus is nearly closed; there is just a small area at the most distal aspect of the calcaneus that is open. On the left, the area where we applied to the skin substitute has a healthier-looking bed of granulation tissue. The wound dimensions are not significantly different on this side but the wound surface is improved. I used a curette to debride some callus from around the right calcaneus wound. I also debrided senescent tissue and callus from the right calcaneal wound. Using the prescribed technique, I then applied Vendaje #2 to the left calcaneal wound. We will continue to use PolyMem Ag on the right and a total contact cast over the trial (no bill) skin substitute on the left. Follow-up in 1 week. Electronic Signature(s) Signed: 12/23/2021 11:08:45 AM By: Fredirick Maudlin MD FACS Entered By: Fredirick Maudlin on 12/23/2021 11:08:44 -------------------------------------------------------------------------------- HxROS Details Patient Name: Date of Service: [name], [place] 12/23/2021 9:30 A M Medical Record Number: [id number] Patient Account Number: [id number] Date of Birth/Sex: Treating RN: 11/14/1975 (46 y.o. [name] Primary Care Provider: [name] Other Clinician: Referring Provider: Treating Provider/Extender: [name] in Treatment:  21 Information Obtained From Patient Constitutional Symptoms (General Health) Medical History: Past Medical History Notes: COVID PNA 07/22/2019-11/14/2019 [facility] ECMO, foot drop left foot , Eyes Medical History: Negative for: Cataracts;  Glaucoma; Optic Neuritis Ear/Nose/Mouth/Throat Medical History: Negative for: Chronic sinus problems/congestion; Middle ear problems Hematologic/Lymphatic Medical History: Negative for: Anemia; Hemophilia; Human Immunodeficiency Virus; Lymphedema; Sickle Cell Disease Respiratory Medical History: Positive for: Asthma Negative for: Aspiration; Chronic Obstructive Pulmonary Disease (COPD); Pneumothorax; Sleep Apnea; Tuberculosis Cardiovascular Medical History: Positive for: Angina - with COVID; Hypertension Negative for: Arrhythmia; Congestive Heart Failure; Coronary Artery Disease; Deep Vein Thrombosis; Hypotension; Myocardial Infarction; Peripheral Arterial Disease; Peripheral Venous Disease; Phlebitis; Vasculitis Gastrointestinal Medical History: Negative for: Cirrhosis ; Colitis; Crohns; Hepatitis A; Hepatitis B; Hepatitis C Endocrine Medical History: Negative for: Type I Diabetes; Type II Diabetes Genitourinary Medical History: Negative for: End Stage Renal Disease Past Medical History Notes: kidney stone Immunological Medical History: Negative for: Lupus Erythematosus; Raynauds; Scleroderma Integumentary (Skin) Medical History: Negative for: History of Burn Musculoskeletal Medical History: Negative for: Gout; Rheumatoid Arthritis; Osteoarthritis; Osteomyelitis Neurologic Medical History: Negative for: Dementia; Neuropathy; Quadriplegia; Paraplegia; Seizure Disorder Oncologic Medical History: Negative for: Received Chemotherapy; Received Radiation Psychiatric Medical History: Negative for: Anorexia/bulimia; Confinement Anxiety Past Medical History Notes: anxiety Immunizations Pneumococcal Vaccine: Received Pneumococcal  Vaccination: No Implantable Devices None Hospitalization / Surgery History Type of Hospitalization/Surgery COVID PNA 07/22/2019- 11/14/2019 03/27/2020 wound debridement/ skin graft Family and Social History Cancer: Yes - Maternal Grandparents; Diabetes: Yes - Father,Paternal Grandparents; Heart Disease: Yes - Maternal Grandparents; Hereditary Spherocytosis: No; Hypertension: Yes - Father,Paternal Grandparents; Kidney Disease: No; Lung Disease: Yes - Siblings; Seizures: No; Stroke: No; Thyroid Problems: No; Tuberculosis: No; Never smoker; Marital Status - Married; Alcohol Use: Never; Drug Use: No History; Caffeine Use: Daily - tea, soda; Financial Concerns: No; Food, Clothing or Shelter Needs: No; Support System Lacking: No; Transportation Concerns: No Engineer, maintenance) Signed: 12/23/2021 12:01:39 PM By: Fredirick Maudlin MD FACS Signed: 12/24/2021 10:43:18 AM By: [name] Entered By: Fredirick Maudlin on 12/23/2021 11:06:17 -------------------------------------------------------------------------------- Total Contact Cast Details Patient Name: Date of Service: [name], [place] 12/23/2021 9:30 A M Medical Record Number: [id number] Patient Account Number: [id number] Date of Birth/Sex: Treating RN: [birth year] (46 y.o. [name] Primary Care Provider: [name] Other Clinician: Referring Provider: Treating Provider/Extender: [name] in Treatment: 47 T Contact Cast Applied for Wound Assessment: otal Wound #2 Left Calcaneus Performed By: Physician Fredirick Maudlin, MD Post Procedure Diagnosis Same as Pre-procedure Electronic Signature(s) Signed: 12/23/2021 12:01:39 PM By: Fredirick Maudlin MD FACS Signed: 12/23/2021 5:25:10 PM By: Levan Hurst RN, BSN Entered By: Levan Hurst on 12/23/2021 10:38:51 -------------------------------------------------------------------------------- [place] Details Patient Name: Date of Service: [name], [place]. 12/23/2021 Medical Record Number: [id number] Patient Account Number: [id number] Date of Birth/Sex: Treating RN: Dec 21, 1975 (46 y.o. [name] Primary Care Provider: [name] Other Clinician: Referring Provider: Treating Provider/Extender: [name] in Treatment: 36 Diagnosis Coding ICD-10 Codes Code Description [id number] Non-pressure chronic ulcer of other part of right foot with fat layer exposed L97.522 Non-pressure chronic ulcer of other part of left foot with fat layer exposed Facility Procedures CPT4 Code: [id number] Description: [phone] - DEBRIDE WOUND 1ST 20 SQ CM OR < ICD-10 Diagnosis Description L97.512 Non-pressure chronic ulcer of other part of right foot with fat layer exposed L97.522 Non-pressure chronic ulcer of other part of left foot with fat layer exposed Modifier: Quantity: 1 Physician Procedures : CPT4 Code Description Modifier [id number] - WC PHYS LEVEL 3 - EST PT 25 ICD-10 Diagnosis Description L97.512 Non-pressure chronic ulcer of other part of right foot with fat layer exposed L97.522 Non-pressure chronic ulcer of other  part of left foot  with fat layer exposed Quantity: 1 Electronic Signature(s) Signed: 12/23/2021 4:19:50 PM By: Fredirick Maudlin MD FACS Signed: 12/23/2021 5:25:10 PM By: Levan Hurst RN, BSN Previous Signature: 12/23/2021 11:09:09 AM Version By: Fredirick Maudlin MD FACS Entered By: Levan Hurst on 12/23/2021 16:16:52

## 2021-12-27 ENCOUNTER — Ambulatory Visit: Payer: BC Managed Care – PPO | Admitting: Neurology

## 2021-12-30 ENCOUNTER — Encounter (HOSPITAL_BASED_OUTPATIENT_CLINIC_OR_DEPARTMENT_OTHER): Payer: BC Managed Care – PPO | Admitting: General Surgery

## 2021-12-30 DIAGNOSIS — L97512 Non-pressure chronic ulcer of other part of right foot with fat layer exposed: Secondary | ICD-10-CM | POA: Diagnosis not present

## 2021-12-30 DIAGNOSIS — Z8616 Personal history of COVID-19: Secondary | ICD-10-CM | POA: Diagnosis not present

## 2021-12-30 DIAGNOSIS — L97522 Non-pressure chronic ulcer of other part of left foot with fat layer exposed: Secondary | ICD-10-CM | POA: Diagnosis not present

## 2021-12-30 NOTE — Progress Notes (Signed)
SHAQUILL, ISEMAN ([id number]) Visit Report for 12/30/2021 Arrival Information Details Patient Name: Date of Service: [name], [place] 12/30/2021 9:30 A M Medical Record Number: [id number] Patient Account Number: [id number] Date of Birth/Sex: Treating RN: [birth year] (46 y.o. [name] Primary Care Jovonte Commins: [name] Other Clinician: Referring Arch Methot: Treating Noella Kipnis/Extender: [name] in Treatment: 58 Visit Information History Since Last Visit Added or deleted any medications: No Patient Arrived: Wheel Chair Any new allergies or adverse reactions: No Arrival Time: 09:51 Had a fall or experienced change in No Accompanied By: spouse activities of daily living that may affect Transfer Assistance: None risk of falls: Patient Requires Transmission-Based Precautions: No Signs or symptoms of abuse/neglect since last visito No Patient Has Alerts: No Hospitalized since last visit: No Implantable device outside of the clinic excluding No cellular tissue based products placed in the center since last visit: Has Dressing in Place as Prescribed: Yes Pain Present Now: Yes Electronic Signature(s) Signed: 12/30/2021 5:13:16 PM By: [name] Entered By: [name] on 12/30/2021 10:23:29 -------------------------------------------------------------------------------- Encounter Discharge Information Details Patient Name: Date of Service: [name], [place]. 12/30/2021 9:30 A M Medical Record Number: [id number] Patient Account Number: [id number] Date of Birth/Sex: Treating RN: 11/29/1975 (46 y.o. [name] Primary Care Darivs Lunden: [name] Other Clinician: Referring Roseland Braun: Treating Catha Ontko/Extender: [name] in Treatment: 53 Encounter Discharge Information Items Post Procedure Vitals Discharge Condition: Stable Temperature (F): 98.7 Ambulatory Status: Ambulatory Pulse (bpm): 76 Discharge  Destination: Home Respiratory Rate (breaths/min): 18 Transportation: Private Auto Blood Pressure (mmHg): 142/83 Accompanied By: spouse Schedule Follow-up Appointment: Yes Clinical Summary of Care: Patient Declined Electronic Signature(s) Signed: 12/30/2021 5:13:16 PM By: [name] Entered By: [name] on 12/30/2021 17:07:27 -------------------------------------------------------------------------------- Lower Extremity Assessment Details Patient Name: Date of Service: [name], [place] 12/30/2021 9:30 A M Medical Record Number: [id number] Patient Account Number: [id number] Date of Birth/Sex: Treating RN: 21-Dec-1975 (46 y.o. [name] Primary Care Ngozi Alvidrez: [name] Other Clinician: Referring Jayla Mackie: Treating Lenea Bywater/Extender: [name] in Treatment: 76 Edema Assessment Assessed: [name]: No] [name]: No] Edema: [Left: No] [Right: No] Calf Left: Right: Point of Measurement: 29 cm From Medial Instep 41 cm 43 cm Ankle Left: Right: Point of Measurement: 9 cm From Medial Instep 23.5 cm 23.2 cm Electronic Signature(s) Signed: 12/30/2021 5:13:16 PM By: [name] Entered By: [name] on 12/30/2021 10:25:10 -------------------------------------------------------------------------------- Multi Wound Chart Details Patient Name: Date of Service: [name], [place] 12/30/2021 9:30 A M Medical Record Number: [id number] Patient Account Number: [id number] Date of Birth/Sex: Treating RN: 1976/06/14 (46 y.o. [name] Primary Care Aariana Shankland: [name] Other Clinician: Referring Mahlon Gabrielle: Treating Eleana Tocco/Extender: [name] in Treatment: 59 Vital Signs Height(in): 22 Pulse(bpm): 60 Weight(lbs): 280 Blood Pressure(mmHg): 142/83 Body Mass Index(BMI): 41.3 Temperature(F): 98.7 Respiratory Rate(breaths/min): 18 Photos: [N/A:N/A] Right Calcaneus Left Calcaneus N/A Wound  Location: Pressure Injury Pressure Injury N/A Wounding Event: Pressure Ulcer Pressure Ulcer N/A Primary Etiology: Asthma, Angina, Hypertension Asthma, Angina, Hypertension N/A Comorbid History: 10/07/2019 10/07/2019 N/A Date Acquired: 76 76 N/A Weeks of Treatment: Open Open N/A Wound Status: No No N/A Wound Recurrence: 0.7x0.2x0.2 1.2x1x0.2 N/A Measurements L x W x D (cm) 0.11 0.942 N/A A (cm) : rea 0.022 0.188 N/A Volume (cm) : 99.70% 96.50% N/A % Reduction in A rea: 99.90% 99.30% N/A % Reduction in Volume: Category/Stage III Category/Stage III N/A Classification: Medium Medium N/A Exudate A  mount: Serosanguineous Serosanguineous N/A Exudate Type: red, brown red, brown N/A Exudate Color: Distinct, outline attached Distinct, outline attached N/A Wound Margin: Medium (34-66%) Small (1-33%) N/A Granulation A mount: Red, Pink Red, Pink N/A Granulation Quality: Medium (34-66%) Large (67-100%) N/A Necrotic A mount: Eschar, Adherent Slough Eschar, Adherent Slough N/A Necrotic Tissue: Fat Layer (Subcutaneous Tissue): Yes Fat Layer (Subcutaneous Tissue): Yes N/A Exposed Structures: Fascia: No Fascia: No Tendon: No Tendon: No Muscle: No Muscle: No Joint: No Joint: No Bone: No Bone: No Medium (34-66%) Medium (34-66%) N/A Epithelialization: Debridement - Selective/Open Wound Debridement - Selective/Open Wound N/A Debridement: Pre-procedure Verification/Time Out 10:36 10:36 N/A Taken: Lidocaine 4% T opical Solution Lidocaine 4% T opical Solution N/A Pain Control: Callus, Slough Callus, Slough N/A Tissue Debrided: Non-Viable Tissue Non-Viable Tissue N/A Level: 0.14 1.2 N/A Debridement A (sq cm): rea Curette, Rongeur Curette, Rongeur N/A Instrument: Minimum Minimum N/A Bleeding: Pressure Pressure N/A Hemostasis A chieved: 0 0 N/A Procedural Pain: 0 0 N/A Post Procedural Pain: Procedure was tolerated well Procedure was tolerated well N/A Debridement  Treatment Response: 0.7x0.2x0.2 1.2x1x0.2 N/A Post Debridement Measurements L x W x D (cm) 0.022 0.188 N/A Post Debridement Volume: (cm) Category/Stage III Category/Stage III N/A Post Debridement Stage: Debridement Debridement N/A Procedures Performed: Treatment Notes Electronic Signature(s) Signed: 12/30/2021 11:02:43 AM By: Fredirick Maudlin MD FACS Signed: 12/30/2021 5:13:16 PM By: [name] Entered By: Fredirick Maudlin on 12/30/2021 11:02:43 -------------------------------------------------------------------------------- Multi-Disciplinary Care Plan Details Patient Name: Date of Service: [name], [place] 12/30/2021 9:30 A M Medical Record Number: [id number] Patient Account Number: [id number] Date of Birth/Sex: Treating RN: 01/02/1976 (46 y.o. [name] Primary Care Sameer Teeple: [name] Other Clinician: Referring Tammara Massing: Treating Ronnae Kaser/Extender: [name] in Treatment: 2 Multidisciplinary Care Plan reviewed with physician Active Inactive Wound/Skin Impairment Nursing Diagnoses: Impaired tissue integrity Knowledge deficit related to ulceration/compromised skin integrity Goals: Patient/caregiver will verbalize understanding of skin care regimen Date Initiated: 07/15/2020 Target Resolution Date: 02/04/2022 Goal Status: Active Ulcer/skin breakdown will have a volume reduction of 30% by week 4 Date Initiated: 07/15/2020 Date Inactivated: 08/20/2020 Target Resolution Date: 09/03/2020 Goal Status: Unmet Unmet Reason: no major changes. Ulcer/skin breakdown will heal within 14 weeks Date Initiated: 12/04/2020 Date Inactivated: 12/10/2020 Target Resolution Date: 12/10/2020 Unmet Reason: wounds still open at 14 Goal Status: Unmet weeks and today 21 weeks. Interventions: Assess patient/caregiver ability to obtain necessary supplies Assess patient/caregiver ability to perform ulcer/skin care regimen upon admission and as  needed Assess ulceration(s) every visit Provide education on ulcer and skin care Treatment Activities: Skin care regimen initiated : 07/15/2020 Topical wound management initiated : 07/15/2020 Notes: Electronic Signature(s) Signed: 12/30/2021 5:13:16 PM By: [name] Entered By: [name] on 12/30/2021 17:03:32 -------------------------------------------------------------------------------- Pain Assessment Details Patient Name: Date of Service: [name], [place] 12/30/2021 9:30 A M Medical Record Number: [id number] Patient Account Number: [id number] Date of Birth/Sex: Treating RN: [birth year] (46 y.o. [name] Primary Care Emmilia Sowder: [name] Other Clinician: Referring Susane Bey: Treating Jernard Reiber/Extender: [name] in Treatment: 109 Active Problems Location of Pain Severity and Description of Pain Patient Has Paino Yes Site Locations Pain Location: Generalized Pain With Dressing Change: No Duration of the Pain. Constant / Intermittento Constant Rate the pain. Current Pain Level: 2 Worst Pain Level: 10 Least Pain Level: 2 Tolerable Pain Level: 10 Character of Pain Describe the Pain: Difficult to Pinpoint Pain Management and Medication Current Pain Management: Medication: Yes Cold Application: No Rest: Yes Massage:  No Activity: No T.E.N.S.: No Heat Application: No Leg drop or elevation: No Is the Current Pain Management Adequate: Adequate How does your wound impact your activities of daily livingo Sleep: No Bathing: No Appetite: No Relationship With Others: No Bladder Continence: No Emotions: No Bowel Continence: No Work: No Toileting: No Drive: No Dressing: No Hobbies: No Electronic Signature(s) Signed: 12/30/2021 5:13:16 PM By: [name] Entered By: [name] on 12/30/2021 10:24:38 -------------------------------------------------------------------------------- Patient/Caregiver Education  Details Patient Name: Date of Service: Ciro Backer 5/25/2023andnbsp9:30 [place] Record Number: [id number] Patient Account Number: [id number] Date of Birth/Gender: Treating RN: [birth year] (46 y.o. [name] Primary Care Physician: [name] Other Clinician: Referring Physician: Treating Physician/Extender: [name] in Treatment: 53 Education Assessment Education Provided To: Patient Education Topics Provided Wound/Skin Impairment: Methods: Explain/Verbal Responses: Return demonstration correctly Electronic Signature(s) Signed: 12/30/2021 5:13:16 PM By: [name] Entered By: [name] on 12/30/2021 17:03:57 -------------------------------------------------------------------------------- Wound Assessment Details Patient Name: Date of Service: [name], [place] 12/30/2021 9:30 A M Medical Record Number: [id number] Patient Account Number: [id number] Date of Birth/Sex: Treating RN: 1975-09-14 (46 y.o. [name] Primary Care Jaelle Campanile: [name] Other Clinician: Referring Shahzad Thomann: Treating Venetta Knee/Extender: [name] in Treatment: 76 Wound Status Wound Number: 1 Primary Etiology: Pressure Ulcer Wound Location: Right Calcaneus Wound Status: Open Wounding Event: Pressure Injury Comorbid History: Asthma, Angina, Hypertension Date Acquired: 10/07/2019 Weeks Of Treatment: 76 Clustered Wound: No Photos Wound Measurements Length: (cm) 0.7 Width: (cm) 0.2 Depth: (cm) 0.2 Area: (cm) 0.11 Volume: (cm) 0.022 % Reduction in Area: 99.7% % Reduction in Volume: 99.9% Epithelialization: Medium (34-66%) Tunneling: No Undermining: No Wound Description Classification: Category/Stage III Wound Margin: Distinct, outline attached Exudate Amount: Medium Exudate Type: Serosanguineous Exudate Color: red, brown Foul Odor After Cleansing: No Slough/Fibrino Yes Wound Bed Granulation  Amount: Medium (34-66%) Exposed Structure Granulation Quality: Red, Pink Fascia Exposed: No Necrotic Amount: Medium (34-66%) Fat Layer (Subcutaneous Tissue) Exposed: Yes Necrotic Quality: Eschar, Adherent Slough Tendon Exposed: No Muscle Exposed: No Joint Exposed: No Bone Exposed: No Treatment Notes Wound #1 (Calcaneus) Wound Laterality: Right Cleanser Normal Saline Discharge Instruction: Cleanse the wound with Normal Saline prior to applying a clean dressing using gauze sponges, not tissue or cotton balls. Wound Cleanser Discharge Instruction: Cleanse the wound with wound cleanser prior to applying a clean dressing using gauze sponges, not tissue or cotton balls. Peri-Wound Care Topical Primary Dressing PolyMem Silver Non-Adhesive Dressing, 4.25x4.25 in Discharge Instruction: Apply to wound bed as instructed Secondary Dressing Zetuvit Plus 4x8 in Discharge Instruction: Apply over primary dressing as directed. Secured With Elastic Bandage 4 inch (ACE bandage) Discharge Instruction: Secure with ACE bandage as directed. Kerlix Roll Sterile, 4.5x3.1 (in/yd) Discharge Instruction: Secure with Kerlix as directed. Compression Wrap Compression Stockings Add-Ons Electronic Signature(s) Signed: 12/30/2021 5:13:16 PM By: [name] Entered By: [name] on 12/30/2021 10:28:56 -------------------------------------------------------------------------------- Wound Assessment Details Patient Name: Date of Service: [name], [place] 12/30/2021 9:30 A M Medical Record Number: [id number] Patient Account Number: [id number] Date of Birth/Sex: Treating RN: [birth year] (46 y.o. [name] Primary Care Nicodemus Denk: [name] Other Clinician: Referring Anitha Kreiser: Treating Wiatt Mahabir/Extender: [name] in Treatment: 62 Wound Status Wound Number: 2 Primary Etiology: Pressure Ulcer Wound Location: Left Calcaneus Wound Status: Open Wounding Event:  Pressure Injury Comorbid History: Asthma, Angina, Hypertension Date Acquired: 10/07/2019 Weeks Of Treatment: 76 Clustered Wound: No Photos Wound Measurements Length: (cm) 1.2 Width: (  cm) 1 Depth: (cm) 0.2 Area: (cm) 0.942 Volume: (cm) 0.188 % Reduction in Area: 96.5% % Reduction in Volume: 99.3% Epithelialization: Medium (34-66%) Tunneling: No Undermining: No Wound Description Classification: Category/Stage III Wound Margin: Distinct, outline attached Exudate Amount: Medium Exudate Type: Serosanguineous Exudate Color: red, brown Foul Odor After Cleansing: No Slough/Fibrino Yes Wound Bed Granulation Amount: Small (1-33%) Exposed Structure Granulation Quality: Red, Pink Fascia Exposed: No Necrotic Amount: Large (67-100%) Fat Layer (Subcutaneous Tissue) Exposed: Yes Necrotic Quality: Eschar, Adherent Slough Tendon Exposed: No Muscle Exposed: No Joint Exposed: No Bone Exposed: No Treatment Notes Wound #2 (Calcaneus) Wound Laterality: Left Cleanser Normal Saline Discharge Instruction: Cleanse the wound with Normal Saline prior to applying a clean dressing using gauze sponges, not tissue or cotton balls. Wound Cleanser Discharge Instruction: Cleanse the wound with wound cleanser prior to applying a clean dressing using gauze sponges, not tissue or cotton balls. Peri-Wound Care Zinc Oxide Ointment 30g tube Discharge Instruction: Apply Zinc Oxide to periwound with each dressing change Topical Primary Dressing Vendaje Secondary Dressing Zetuvit Plus 4x8 in Discharge Instruction: Apply over primary dressing as directed. Secured With [employer], 4.5x3.1 (in/yd) Discharge Instruction: Secure with Kerlix as directed. 32M Medipore H Soft Cloth Surgical T ape, 4 x 10 (in/yd) Discharge Instruction: Secure with tape as directed. Compression Wrap Compression Stockings Add-Ons Electronic Signature(s) Signed: 12/30/2021 5:13:16 PM By: [name] Entered By:  [name] on 12/30/2021 10:28:15 -------------------------------------------------------------------------------- Vitals Details Patient Name: Date of Service: [name], [place] 12/30/2021 9:30 A M Medical Record Number: [id number] Patient Account Number: [id number] Date of Birth/Sex: Treating RN: 09-07-1975 (46 y.o. [name] Primary Care Lexandra Rettke: [name] Other Clinician: Referring Jerrod Damiano: Treating Miles Borkowski/Extender: [name] in Treatment: 98 Vital Signs Time Taken: 09:52 Temperature (F): 98.7 Height (in): 69 Pulse (bpm): 76 Weight (lbs): 280 Respiratory Rate (breaths/min): 18 Body Mass Index (BMI): 41.3 Blood Pressure (mmHg): 142/83 Reference Range: 80 - 120 mg / dl Electronic Signature(s) Signed: 12/30/2021 5:13:16 PM By: [name] Entered By: [name] on 12/30/2021 10:23:56

## 2021-12-31 IMAGING — DX DG CHEST 1V PORT
1 series · 1 of 1 positions shown · non-contrast
Comparison: Portable exam 1411 hours compared to 10/16/2019

CLINICAL DATA: Tracheostomy, pneumonia, respiratory distress,
asthma, hypertension

EXAM:
PORTABLE CHEST 1 VIEW

[chest ap]
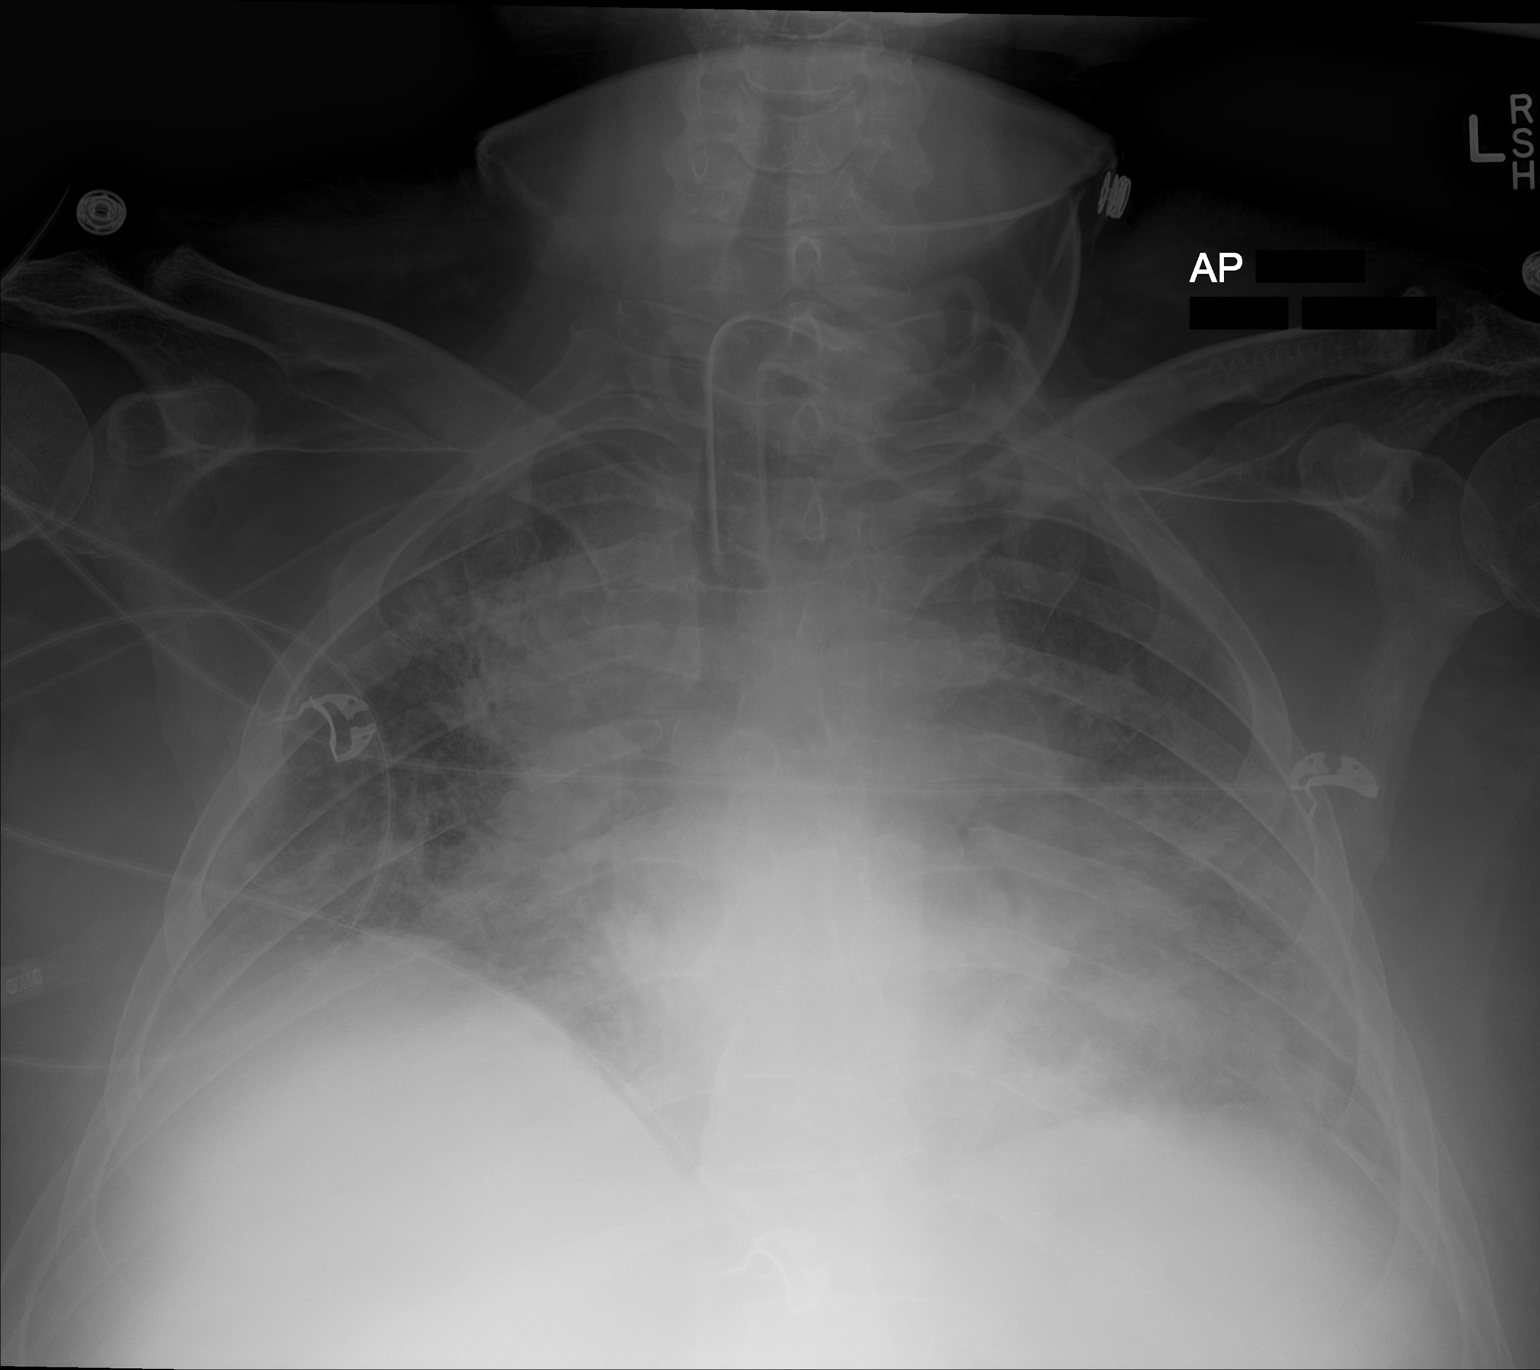

[1 of 1 positions shown; findings below may reference images not displayed]

FINDINGS: Tip of tracheostomy tube projects 3.7 cm above carina.

RIGHT arm PICC line tip projects over SVC.

Enlargement of cardiac silhouette.

Patchy BILATERAL pulmonary infiltrates greater in RIGHT upper lobe
and LEFT base.

No definite pleural effusion or pneumothorax.
IMPRESSION: Persistent BILATERAL pulmonary infiltrates consistent with
multifocal pneumonia, slightly increased in RIGHT upper lobe since
previous study.

## 2021-12-31 NOTE — Progress Notes (Signed)
[name], [name] ([id number]) Visit Report for 12/30/2021 Chief Complaint Document Details Patient Name: Date of Service: [name] [place] 12/30/2021 9:30 A M Medical Record Number: [id number] Patient Account Number: [id number] Date of Birth/Sex: Treating RN: 08/06/1976 (46 y.o. [name] Primary Care Provider: [name] Other Clinician: Referring Provider: Treating Provider/Extender: [name] in Treatment: 40 Information Obtained from: Patient Chief Complaint Bilateral Plantar Foot Ulcers Electronic Signature(s) Signed: 12/30/2021 11:02:58 AM By: Fredirick Maudlin MD FACS Entered By: Fredirick Maudlin on 12/30/2021 11:02:58 -------------------------------------------------------------------------------- Cellular or Tissue Based Product Details Patient Name: Date of Service: [name], [place] 12/30/2021 9:30 A M Medical Record Number: [id number] Patient Account Number: [id number] Date of Birth/Sex: Treating RN: 09/07/1975 (46 y.o. [name] Primary Care Provider: [name] Other Clinician: Referring Provider: Treating Provider/Extender: [name] in Treatment: 76 Cellular or Tissue Based Product Type Wound #2 Left Calcaneus Applied to: Performed By: Physician Fredirick Maudlin, MD Cellular or Tissue Based Product Type: Other Level of Consciousness (Pre-procedure): Awake and Alert Pre-procedure Verification/Time Out Yes - 10:36 Taken: Location: genitalia / hands / feet / multiple digits Wound Size (sq cm): 1.2 Product Size (sq cm): 8 Waste Size (sq cm): 0 Amount of Product Applied (sq cm): 8 Instrument Used: Forceps, Scissors Lot #: [id number] Order #: 3 Expiration Date: 10/12/2024 Fenestrated: No Reconstituted: No Secured: Yes Secured With: Steri-Strips Dressing Applied: Yes Primary Dressing: Adaptic,gauze Procedural Pain: 0 Post Procedural Pain: 0 Response to Treatment: Procedure was tolerated  well Level of Consciousness (Post- Awake and Alert procedure): Post Procedure Diagnosis Same as Pre-procedure Notes *********Vendaje #3 was a trial skin sub. Please DO NOT CHARGE THE PATIENT.******* Electronic Signature(s) Signed: 12/30/2021 5:13:16 PM By: [name] Signed: 12/31/2021 9:16:19 AM By: Fredirick Maudlin MD FACS Entered By: [name] on 12/30/2021 17:06:13 -------------------------------------------------------------------------------- Debridement Details Patient Name: Date of Service: [name], [place] 12/30/2021 9:30 A M Medical Record Number: [id number] Patient Account Number: [id number] Date of Birth/Sex: Treating RN: [birth year] (46 y.o. [name] Primary Care Provider: [name] Other Clinician: Referring Provider: Treating Provider/Extender: [name] in Treatment: 76 Debridement Performed for Assessment: Wound #1 Right Calcaneus Performed By: Physician Fredirick Maudlin, MD Debridement Type: Debridement Level of Consciousness (Pre-procedure): Awake and Alert Pre-procedure Verification/Time Out Yes - 10:36 Taken: Start Time: 10:36 Pain Control: Lidocaine 4% T opical Solution T Area Debrided (L x W): otal 0.7 (cm) x 0.2 (cm) = 0.14 (cm) Tissue and other material debrided: Non-Viable, Callus, Slough, Slough Level: Non-Viable Tissue Debridement Description: Selective/Open Wound Instrument: Curette, Rongeur Bleeding: Minimum Hemostasis Achieved: Pressure End Time: 10:38 Procedural Pain: 0 Post Procedural Pain: 0 Response to Treatment: Procedure was tolerated well Level of Consciousness (Post- Awake and Alert procedure): Post Debridement Measurements of Total Wound Length: (cm) 0.7 Stage: Category/Stage III Width: (cm) 0.2 Depth: (cm) 0.2 Volume: (cm) 0.022 Character of Wound/Ulcer Post Debridement: Improved Post Procedure Diagnosis Same as Pre-procedure Electronic Signature(s) Signed: 12/30/2021  11:07:57 AM By: Fredirick Maudlin MD FACS Signed: 12/30/2021 5:13:16 PM By: [name] Entered By: [name] on 12/30/2021 10:38:55 -------------------------------------------------------------------------------- Debridement Details Patient Name: Date of Service: [name], [place]. 12/30/2021 9:30 A M Medical Record Number: [id number] Patient Account Number: [id number] Date of Birth/Sex: Treating RN: March 05, 1976 (46 y.o. [name] Primary Care Provider: [name] Other Clinician: Referring Provider: Treating Provider/Extender: [name] in Treatment: 76 Debridement Performed for Assessment: Wound #2 Left  Calcaneus Performed By: Physician Fredirick Maudlin, MD Debridement Type: Debridement Level of Consciousness (Pre-procedure): Awake and Alert Pre-procedure Verification/Time Out Yes - 10:36 Taken: Start Time: 10:36 Pain Control: Lidocaine 4% T opical Solution T Area Debrided (L x W): otal 1.2 (cm) x 1 (cm) = 1.2 (cm) Tissue and other material debrided: Non-Viable, Callus, Slough, Slough Level: Non-Viable Tissue Debridement Description: Selective/Open Wound Instrument: Curette, Rongeur Bleeding: Minimum Hemostasis Achieved: Pressure End Time: 10:38 Procedural Pain: 0 Post Procedural Pain: 0 Response to Treatment: Procedure was tolerated well Level of Consciousness (Post- Awake and Alert procedure): Post Debridement Measurements of Total Wound Length: (cm) 1.2 Stage: Category/Stage III Width: (cm) 1 Depth: (cm) 0.2 Volume: (cm) 0.188 Character of Wound/Ulcer Post Debridement: Improved Post Procedure Diagnosis Same as Pre-procedure Electronic Signature(s) Signed: 12/30/2021 11:07:57 AM By: Fredirick Maudlin MD FACS Signed: 12/30/2021 5:13:16 PM By: [name] Entered By: [name] on 12/30/2021 10:40:25 -------------------------------------------------------------------------------- HPI Details Patient Name:  Date of Service: [name], [place] 12/30/2021 9:30 A M Medical Record Number: [id number] Patient Account Number: [id number] Date of Birth/Sex: Treating RN: 06/27/1976 (46 y.o. [name] Primary Care Provider: [name] Other Clinician: Referring Provider: Treating Provider/Extender: [name] in Treatment: 4 History of Present Illness HPI Description: Wounds are12/03/2020 upon evaluation today patient presents for initial inspection here in our clinic concerning issues he has been having with the bottoms of his feet bilaterally. He states these actually occurred as wounds when he was hospitalized for 5 months secondary to Covid. He was apparently with tilting bed where he was in an upright position quite frequently and apparently this occurred in some way shape or form during that time. Fortunately there is no sign of active infection at this time. No fevers, chills, nausea, vomiting, or diarrhea. With that being said he still has substantial wounds on the plantar aspects of his feet Theragen require quite a bit of work to get these to heal. He has been using Santyl currently though that is been problematic both in receiving the medication as well as actually paid for it as it is become quite expensive. Prior to the experience with Covid the patient really did not have any major medical problems other than hypertension he does have some mild generalized weakness following the Covid experience. 07/22/2020 on evaluation today patient appears to be doing okay in regard to his foot ulcers I feel like the wound beds are showing signs of better improvement that I do believe the Iodoflex is helping in this regard. With that being said he does have a lot of drainage currently and this is somewhat blue/green in nature which is consistent with Pseudomonas. I do think a culture today would be appropriate for [place] to evaluate and see if that is indeed the case I would likely  start him on antibiotic orally as well he is not allergic to Cipro knows of no issues he has had in the past 12/21; patient was admitted to the clinic earlier this month with bilateral presumed pressure ulcers on the bottom of his feet apparently related to excessive pressure from a tilt table arrangement in the intensive care unit. Patient relates this to being on ECMO but I am not really sure that is exactly related to that. I must say I have never seen anything like this. He has fairly extensive full-thickness wounds extending from his heel towards his midfoot mostly centered laterally. There is already been some healing distally. He does not appear to  have an arterial issue. He has been using gentamicin to the wound surfaces with Iodoflex to help with ongoing debridement 1/6; this is a patient with pressure ulcers on the bottom of his feet related to excessive pressure from a standing position in the intensive care unit. He is complaining of a lot of pain in the right heel. He is not a diabetic. 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 We have been using Iodoflex to help prepare the surfaces of both wounds for an advanced treatment product. He is nonambulatory spending most of his time in a wheelchair I have asked him not to propel the wheelchair with his heels 1/13; in general his wounds look better not much surface area change we have been using Iodoflex as of last week. I did an x-ray of the right heel as the patient was complaining of pain. I had some thoughts about a stress fracture perhaps Achilles tendon problems however what it showed was erosive changes along the inferior aspect of the calcaneus he now has a MRI booked for 1/20. 1/20; in general his wounds continue to be better. Some improvement in the large narrow areas proximally in his foot. He is still complaining of pain in the right heel and tenderness in certain areas of this wound. His MRI is tonight. I am  not just looking for osteomyelitis that was brought up on the x-ray I am wondering about stress fractures, tendon ruptures etc. He has no such findings on the left. Also noteworthy is that the patient had critical illness neuropathy and some of the discomfort may be actual improvement in nerve function I am just not sure. These wounds were initially in the setting of severe critical illness related to COVID-19. He was put in a standing position. He may have also been on pressors at the point contributing to tissue ischemia. By his description at some point these wounds were grossly necrotic extending proximally up into the Achilles part of his heel. I do not know that I have ever really seen pictures of them like this although they may exist in epic We have ordered Tri layer Oasis. I am trying to stimulate some granulation in these areas. This is of course assuming the MRI is negative for infection 1/27; since the patient was last here he saw Dr. Juleen China of infectious disease. He is planned for vancomycin and ceftriaxone. Prior operative culture grew MSSA. Also ordered baseline lab work. He also ordered arterial studies although the ABIs in our clinic were normal as well as his clinical exam these were normal I do not think he needs to see vascular surgery. His ABIs at the [facility] were 1.22 in the right triphasic waveforms with a normal TBI of 1.15 on the left ABI of 1.22 with triphasic waveforms and a normal TBI of 1.08. Finally he saw Dr. Amalia Hailey who will follow him in for 2 months. At this point I do not think he felt that he needed a procedure on the right calcaneal bone. Dr. Juleen China is elected for broad-spectrum antibiotic The patient is still having pain in the right heel. He walks with a walker 2/3; wounds are generally smaller. He is tolerating his IV antibiotics. I believe this is vancomycin and ceftriaxone. We are still waiting for Oasis burn in terms of his out-of-pocket max which he should be  meeting soon given the IV antibiotics, MRIs etc. I have asked him to check in on this. We are using silver collagen in the  meantime the wounds look better 2/10; tolerating IV vancomycin and Rocephin. We are waiting to apply for Oasis. Although I am not really sure where he is in his out-of-pocket max. 2/17 started the first application of Oasis trilayer. Still on antibiotics. The wounds have generally look better. The area on the left has a little more surface slough requiring debridement 9/93; second application of Oasis trilayer. The wound surface granulation is generally look better. The area on the left with undermining laterally I think is come in a bit. 10/08/2020 upon evaluation today patient is here today for [employer] application #3. Fortunately he seems to be doing extremely well with regard to this and we are seeing a lot of new epithelial growth which is great news. Fortunately there is no signs of active infection at this time. 10/16/2020 upon evaluation today patient appears to be doing well with regard to his foot ulcers. Do believe the Oasis has been of benefit for him. I do not see any signs of infection right now which is great news and I think that he has a lot of new epithelial growth which is great to see as well. The patient is very pleased to hear all of this. I do think we can proceed with the Oasis trilayer #4 today. 3/18; not as much improvement in these areas on his heels that I was hoping. I did reapply trilateral Oasis today the tissue looks healthier but not as much fill in as I was hoping. 3/25; better looking today I think this is come in a bit the tissue looks healthier. Triple layer Oasis reapplied #6 4/1; somewhat better looking definitely better looking surface not as much change in surface area as I was hoping. He may be spending more time [place] on days then he needs to although he does have heel offloading boots. Triple layer Oasis reapplied #7 4/7;  unfortunately apparently [facility] will not approve any further Oasis which is unfortunate since the patient did respond nicely both in terms of the condition of the wound bed as well as surface area. There is still some drainage coming from the wound but not a lot there does not appear to be any infection 4/15; we have been using Hydrofera Blue. He continues to have nice rims of epithelialization on the right greater than the left. The left the epithelialization is coming from the tip of his heel. There is moderate drainage. In this that concerns me about a total contact cast. There is no evidence of infection 4/29; patient has been using Hydrofera Blue with dressing changes. He has no complaints or issues today. 5/5; using Hydrofera Blue. I actually think that he looks marginally better than the last time I saw this 3 weeks ago. There are rims of epithelialization on the left thumb coming from the medial side on the right. Using Hydrofera Blue 5/12; using Hydrofera Blue. These continue to make improvements in surface area. His drainage was not listed as severe I therefore went ahead and put a cast on the left foot. Right foot we will continue to dress his previous 5/16; back for first total contact cast change. He did not tolerate this particularly well cast injury on the anterior tibia among other issues. Difficulty sleeping. I talked him about this in some detail and afterwards is elected to continue. I told him I would like to have a cast on for 3 weeks to see if this is going to help at all. I think he  agreed 5/19; I think the wound is better. There is no tunneling towards his midfoot. The undermining medially also looks better. He has a rim of new skin distally. I think we are making progress here. The area on the left also continues to look somewhat better to me using Hydrofera Blue. He has a list of complaints about the cast but none of them seem serious 5/26; patient presents  for 1 week follow-up. He has been using a total contact cast and tolerating this well. Hydrofera Blue is the main dressing used. He denies signs of infection. 6/2 Hydrofera Blue total contact cast on the left. These were large ulcers that formed in intensive care unit where the patient was recovering from [place]. May have had something to do with being ventilated in an upright positiono Pressors etc. We have been able to get the areas down considerably and a viable surface. There is some epithelialization in both sides. Note made of drainage 6/9; changed to [facility] last time because of drainage. He arrives with better looking surfaces and dimensions on the left than the right. Paradoxically the right actually probes more towards his midfoot the left is largely close down but both of these look improved. Using a total contact cast on the left 6/16; complex wounds on his bilateral plantar heels which were initially pressure injury from a stay in the ICU with COVID. We have been using silver alginate most recently. His dimensions of come in quite dramatically however not recently. We have been putting the left foot in a total contact cast 6/23; complex wounds on the bilateral plantar heels. I been putting the left in the cast paradoxically the area on the right is the one that is going towards closure at a faster rate. Quite a bit of drainage on the left. The patient went to see Dr. Amalia Hailey who said he was going to standby for skin grafts. I had actually considered sending him for skin grafts however he would be mandatorily off his feet for a period of weeks to months. I am thinking that the area on the right is going to close on its own the area on the left has been more stubborn even though we have him in a total contact cast 6/30; took him out of a total contact cast last week is the right heel seem to be making better progress than the left where I was placing the cast. We are using silver  alginate. Both wounds are smaller right greater than left 7/12; both wounds look as though they are making some progress. We are using silver alginate. Heel offloading boots 7/26; very gradual progress especially on the right. Using silver alginate. He is wearing heel offloading boots 8/18; he continues to close these wounds down very gradually. Using silver alginate. The problem polymen being definitive about this is areas of what appears to be callus around the margins. This is not a 100% of the area but certainly sizable especially on the right 9/1; bilateral plantar feet wounds secondary to prolonged pressure while being ventilated for COVID-19 in an upright position. Essentially pressure ulcers on the bottom of his feet. He is made substantial progress using silver alginate. 9/14; bilateral plantar feet wounds secondary to prolonged pressure. Making progress using silver alginate. 9/29 bilateral plantar feet wounds secondary to prolonged pressure. I changed him to Iodoflex last week. MolecuLight showing reddened blush fluorescence 10/11; patient presents for follow-up. He has no issues or complaints today. He denies signs of infection.  He continues to use Iodoflex and antibiotic ointment to the wound beds. 10/27; 2-week follow-up. No evidence of infection. He has callus and thick dry skin around the wound margins we have been using Iodoflex and Bactroban which was in response to a moderate left MolecuLight reddish blush fluorescence. 11/10; 2-week follow-up. Wound margins again have thick callus however the measurements of the actual wound sites are a lot smaller. Everything looks reasonably healthy here. We have been using Iodoflex He was approved for prime matrix but I have elected to delay this given the improvement in the surface area. Hopefully I will not regret that decision as were getting close to the end of the year in terms of insurance payment 12/8; 2-week follow-up. Wounds are  generally smaller in size. These were initially substantial wounds extending into the forefoot all the way into the heel on the bilateral plantar feet. They are now both located on the plantar heel distal aspect both of these have a lot of callus around the wounds I used a #5 curette to remove this on the right and the left also some subcutaneous debris to try and get the wound edges were using Iodoflex. He has heel offloading shoe 12/22; 2-week follow-up. Not really much improvement. He has thick callus around the outer edges of both wounds. I remove this there is some nonviable subcutaneous tissue as well. We have been using Iodoflex. Her intake nurse and myself spontaneously thought of a total contact cast I went back in May. At that time we really were not seeing much of an improvement with a cast although the wound was in a much different situation I would like to retry this in 2 weeks and I discussed this with the patient 08/12/2021; the patient has had some improvement with the Iodoflex. The the area on the left heel plantar more improved than the right. I had to put him in a total contact cast on the left although I decided to put that off for 2 weeks. I am going to change his primary dressing to silver collagen. I think in both areas he has had some improvement most of the healing seems to be more proximal in the heel. The wounds are in the mid aspect. A lot of thick callus on the right heel however. 1/19; we are using silver collagen on both plantar heel areas. He has had some improvement today. The left did not require any debridement. He still had some eschar on the right that was debrided but both seem to have contracted. I did not put it total contact cast on him today 2/2 we have been using silver collagen. The area on the right plantar heel has areas that appear to be epithelialized interspersed with dry flaking callus and dry skin. I removed this. This really looks better than on the  other side. On the left still a large area with raised edges and debris on the surface. 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 He has no issues or complaints today. 2/9; not much change in the left foot wound with 1 week of a cast we are using silver collagen. Silver collagen on the right side. The right side has been the better wound surface. We will reapply the total contact cast on the left 2/16; not much improvement on either side I been using silver collagen with a  total contact cast on the left. I'm changing the Hydrofera Blue still with a total contact cast on the left 2/23; some improvement on both sides. Disappointing that he has thick callus around the area that we are putting in a total contact cast on the left. We've been using Hydrofera Blue on both wound areas. This is a man who at essentially pressure ulcers in addition to ischemia caused by medications to support his blood pressure (pressors) in the ICU. He was being ventilated in the standing position for severe Covid. A [place] the wounds extended across his entire foot but are now localized to his plantar heels bilaterally. We have made progress however neither areas healed. I continue to think the total contact cast is helped albeit painstakingly slowly. He has never wanted a plastic surgery consult although I don't know that they would be interested in grafting in area in this location. 10/07/2021: Continued improvement bilaterally. There is still some callus around the left wound, despite the total contact cast. He has some increased pain in his right midfoot around 1 particular area. This has been painful in the past but seems to be a little bit worse. When his cast was removed today, there was an area on the heel of the left foot that looks a bit macerated. He is also complaining of pain in his  left thigh and hip which he thinks is secondary to the limb length discrepancy caused by the cast. 10/14/2021: He continues to improve. A little bit less callus around the left wound. He continues to endorse pain in his right midfoot, but this is not as significant as it was last week. The maceration on his left heel is improved. 10/21/2021: Continued improvement to both wounds. The maceration on his left heel is no longer evident. Less callus bilaterally. Epithelialization progressing. 10/28/2021: Significant improvement this week. The right sided wound is nearly closed with just a small open area at the middle. No maceration seen on the left heel. Continued epithelialization on both sides. No concern for infection. 11/04/2021: T oday, the wounds were measured a little bit differently and come out as larger, but I actually think they are about the same to potentially even smaller, particularly on the left. He continues to accumulate some callus on the right. 11/11/2021: T oday, the patient is expressing some concern that the left wound, despite being in the total contact cast, is not progressing at the same rate as the 1 on the right. He is interested in trying a week without the cast to see how the wound does. The wounds are roughly the same size as last week, with the right perhaps being a little bit smaller. He continues to build up callus on both sites. 11/18/2021: Last week, I permitted the patient to go without his total contact cast, just to see if the cast was really making any difference. Today, both wounds have deteriorated to some extent, suggesting that the cast is providing benefit, at least on the left. Both are larger and have accumulated callus, slough, and other debris. 11/26/2021: I debrided both wounds quite aggressively last week in an effort to stimulate the healing cascade. This appears to have been effective as the left sided wound is a full centimeter shorter in length. Although the  right was measured slightly larger, on inspection, it looks as though an area of epithelialized tissue was included in the measurements. We have been using PolyMem Ag on the wound surfaces with a total contact cast to  the left. 12/02/2021: It appears that the intake personnel are including epithelialized tissue in his wound measurements; the right wound is almost completely epithelialized; there is just a crater at the proximal midfoot with some open areas. On the left, he has built up some callus, but the overall wound surface looks good. There is some senescent skin around the wound margin. He has been in PolyMem Ag bilaterally with a total contact cast on the left. 12/09/2021: The right wound is nearly closed; there is just a small open area at the mid calcaneus. On the left, the wound is smaller with minimal callus buildup. No significant drainage. 12/16/2021: The right calcaneal wound remains minimally open at the mid calcaneus; the rest has epithelialized. On the left, the wound is also a little bit smaller. There is some senescent tissue on the periphery. He is getting his first application of a trial skin substitute called Vendaje today. 12/23/2021: The wound on his right calcaneus is nearly closed; there is just a small area at the most distal aspect of the calcaneus that is open. On the left, the area where we applied to the skin substitute has a healthier-looking bed of granulation tissue. The wound dimensions are not significantly different on this side but the wound surface is improved. 12/30/2021: The wound on the right calcaneus has not changed significantly aside from some accumulation of callus. On the left, the open area is smaller and continues to have an improved surface. He continues to accumulate callus around the wound. He is here for his third application of Vendaje. Electronic Signature(s) Signed: 12/30/2021 11:04:33 AM By: Fredirick Maudlin MD FACS Entered By: Fredirick Maudlin on  12/30/2021 11:04:32 -------------------------------------------------------------------------------- Physical Exam Details Patient Name: Date of Service: [name], [place] 12/30/2021 9:30 A M Medical Record Number: [id number] Patient Account Number: [id number] Date of Birth/Sex: Treating RN: November 03, 1975 (45 y.o. [name] Primary Care Provider: [name] Other Clinician: Referring Provider: Treating Provider/Extender: [name] in Treatment: 29 Constitutional Slightly hypertensive. . . . No acute distress. Respiratory Normal work of breathing on room air. Notes 12/30/2021: The wound on the right calcaneus has not changed significantly aside from some accumulation of callus. On the left, the open area is smaller and continues to have an improved surface. He continues to accumulate callus around the wound. Electronic Signature(s) Signed: 12/30/2021 11:05:10 AM By: Fredirick Maudlin MD FACS Entered By: Fredirick Maudlin on 12/30/2021 11:05:10 -------------------------------------------------------------------------------- Physician Orders Details Patient Name: Date of Service: [name], [place] 12/30/2021 9:30 A M Medical Record Number: [id number] Patient Account Number: [id number] Date of Birth/Sex: Treating RN: [birth year] (46 y.o. [name] Primary Care Provider: [name] Other Clinician: Referring Provider: Treating Provider/Extender: [name] in Treatment: 66 Verbal / Phone Orders: No Diagnosis Coding Follow-up Appointments ppointment in 1 week. - Dr [name] - Room 3 Return A Cellular or Tissue Based Products Cellular or Tissue Based Product Type: - Vendaje #3 (trial) Cellular or Tissue Based Product applied to wound bed, secured with steri-strips, cover with Adaptic or Mepitel. (DO NOT REMOVE). Bathing/ Shower/ Hygiene May shower with protection but do not get wound dressing(s) wet. - Cover with cast  protector (can purchase cast protector at [facility] or [facility] ) Edema Control - Lymphedema / SCD / Other Bilateral Lower Extremities Elevate legs to the level of the heart or above for 30 minutes daily and/or when sitting, a frequency of: - throughout the day Avoid standing for long  periods of time. Moisturize legs daily. - right leg and foot every night. Off-Loading Total Contact Cast to Left Lower Extremity Wedge shoe to: - Globoped Shoe to right foot and left foot when not in TCC Other: - keep pressure off of the bottom of your feet Additional Orders / Instructions Follow Nutritious Diet Wound Treatment Wound #1 - Calcaneus Wound Laterality: Right Cleanser: Normal Saline (Generic) Every Other Day/30 Days Discharge Instructions: Cleanse the wound with Normal Saline prior to applying a clean dressing using gauze sponges, not tissue or cotton balls. Cleanser: Wound Cleanser (Generic) Every Other Day/30 Days Discharge Instructions: Cleanse the wound with wound cleanser prior to applying a clean dressing using gauze sponges, not tissue or cotton balls. Prim Dressing: PolyMem Silver Non-Adhesive Dressing, 4.25x4.25 in (Generic) Every Other Day/30 Days ary Discharge Instructions: Apply to wound bed as instructed Secondary Dressing: Zetuvit Plus 4x8 in (Generic) Every Other Day/30 Days Discharge Instructions: Apply over primary dressing as directed. Secured With: Elastic Bandage 4 inch (ACE bandage) (Generic) Every Other Day/30 Days Discharge Instructions: Secure with ACE bandage as directed. Secured With: [employer], 4.5x3.1 (in/yd) (Generic) Every Other Day/30 Days Discharge Instructions: Secure with Kerlix as directed. Wound #2 - Calcaneus Wound Laterality: Left Cleanser: Normal Saline (Generic) 1 x Per Week/30 Days Discharge Instructions: Cleanse the wound with Normal Saline prior to applying a clean dressing using gauze sponges, not tissue or cotton balls. Cleanser: Wound  Cleanser 1 x Per Week/30 Days Discharge Instructions: Cleanse the wound with wound cleanser prior to applying a clean dressing using gauze sponges, not tissue or cotton balls. Peri-Wound Care: Zinc Oxide Ointment 30g tube 1 x Per Week/30 Days Discharge Instructions: Apply Zinc Oxide to periwound with each dressing change Prim Dressing: Vendaje ary 1 x Per Week/30 Days Secondary Dressing: Zetuvit Plus 4x8 in (Generic) 1 x Per Week/30 Days Discharge Instructions: Apply over primary dressing as directed. Secured With: [employer], 4.5x3.1 (in/yd) (Generic) 1 x Per Week/30 Days Discharge Instructions: Secure with Kerlix as directed. Secured With: 63M Medipore H Soft Cloth Surgical T ape, 4 x 10 (in/yd) (Generic) 1 x Per Week/30 Days Discharge Instructions: Secure with tape as directed. Electronic Signature(s) Signed: 12/30/2021 12:10:13 PM By: Fredirick Maudlin MD FACS Signed: 12/30/2021 5:13:16 PM By: [name] Previous Signature: 12/30/2021 11:05:20 AM Version By: Fredirick Maudlin MD FACS Entered By: [name] on 12/30/2021 11:16:25 -------------------------------------------------------------------------------- Problem List Details Patient Name: Date of Service: [name], [place] 12/30/2021 9:30 A M Medical Record Number: [id number] Patient Account Number: [id number] Date of Birth/Sex: Treating RN: 1975-11-21 (46 y.o. [name] Primary Care Provider: [name] Other Clinician: Referring Provider: Treating Provider/Extender: [name] in Treatment: 33 Active Problems ICD-10 Encounter Code Description Active Date MDM Diagnosis L97.512 Non-pressure chronic ulcer of other part of right foot with fat layer exposed 09/03/2020 No Yes L97.522 Non-pressure chronic ulcer of other part of left foot with fat layer exposed 09/03/2020 No Yes Inactive Problems ICD-10 Code Description Active Date Inactive Date L89.893 Pressure ulcer of other  site, stage 3 07/15/2020 07/15/2020 M62.81 Muscle weakness (generalized) 07/15/2020 07/15/2020 I10 Essential (primary) hypertension 07/15/2020 07/15/2020 M86.171 Other acute osteomyelitis, right ankle and foot 09/03/2020 09/03/2020 Resolved Problems Electronic Signature(s) Signed: 12/30/2021 11:02:24 AM By: Fredirick Maudlin MD FACS Entered By: Fredirick Maudlin on 12/30/2021 11:02:24 -------------------------------------------------------------------------------- Progress Note Details Patient Name: Date of Service: [name], [place] 12/30/2021 9:30 A M Medical Record Number: [id number] Patient Account Number: [id number] Date of  Birth/Sex: Treating RN: [birth year] (46 y.o. [name] Primary Care Provider: [name] Other Clinician: Referring Provider: Treating Provider/Extender: [name] in Treatment: 25 Subjective Chief Complaint Information obtained from Patient Bilateral Plantar Foot Ulcers History of Present Illness (HPI) Wounds are12/03/2020 upon evaluation today patient presents for initial inspection here in our clinic concerning issues he has been having with the bottoms of his feet bilaterally. He states these actually occurred as wounds when he was hospitalized for 5 months secondary to Covid. He was apparently with tilting bed where he was in an upright position quite frequently and apparently this occurred in some way shape or form during that time. Fortunately there is no sign of active infection at this time. No fevers, chills, nausea, vomiting, or diarrhea. With that being said he still has substantial wounds on the plantar aspects of his feet Theragen require quite a bit of work to get these to heal. He has been using Santyl currently though that is been problematic both in receiving the medication as well as actually paid for it as it is become quite expensive. Prior to the experience with Covid the patient really did not have any major  medical problems other than hypertension he does have some mild generalized weakness following the Covid experience. 07/22/2020 on evaluation today patient appears to be doing okay in regard to his foot ulcers I feel like the wound beds are showing signs of better improvement that I do believe the Iodoflex is helping in this regard. With that being said he does have a lot of drainage currently and this is somewhat blue/green in nature which is consistent with Pseudomonas. I do think a culture today would be appropriate for [place] to evaluate and see if that is indeed the case I would likely start him on antibiotic orally as well he is not allergic to Cipro knows of no issues he has had in the past 12/21; patient was admitted to the clinic earlier this month with bilateral presumed pressure ulcers on the bottom of his feet apparently related to excessive pressure from a tilt table arrangement in the intensive care unit. Patient relates this to being on ECMO but I am not really sure that is exactly related to that. I must say I have never seen anything like this. He has fairly extensive full-thickness wounds extending from his heel towards his midfoot mostly centered laterally. There is already been some healing distally. He does not appear to have an arterial issue. He has been using gentamicin to the wound surfaces with Iodoflex to help with ongoing debridement 1/6; this is a patient with pressure ulcers on the bottom of his feet related to excessive pressure from a standing position in the intensive care unit. He is complaining of a lot of pain in the right heel. He is not a diabetic. 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 We have been using Iodoflex to help prepare the surfaces of both wounds for an advanced treatment product. He is nonambulatory spending most of his time in a wheelchair I have asked him not to propel the wheelchair with his heels 1/13; in general his wounds  look better not much surface area change we have been using Iodoflex as of last week. I did an x-ray of the right heel as the patient was complaining of pain. I had some thoughts about a stress fracture perhaps Achilles tendon problems however what it showed was erosive changes along the inferior aspect  of the calcaneus he now has a MRI booked for 1/20. 1/20; in general his wounds continue to be better. Some improvement in the large narrow areas proximally in his foot. He is still complaining of pain in the right heel and tenderness in certain areas of this wound. His MRI is tonight. I am not just looking for osteomyelitis that was brought up on the x-ray I am wondering about stress fractures, tendon ruptures etc. He has no such findings on the left. Also noteworthy is that the patient had critical illness neuropathy and some of the discomfort may be actual improvement in nerve function I am just not sure. These wounds were initially in the setting of severe critical illness related to COVID-19. He was put in a standing position. He may have also been on pressors at the point contributing to tissue ischemia. By his description at some point these wounds were grossly necrotic extending proximally up into the Achilles part of his heel. I do not know that I have ever really seen pictures of them like this although they may exist in epic We have ordered Tri layer Oasis. I am trying to stimulate some granulation in these areas. This is of course assuming the MRI is negative for infection 1/27; since the patient was last here he saw Dr. Juleen China of infectious disease. He is planned for vancomycin and ceftriaxone. Prior operative culture grew MSSA. Also ordered baseline lab work. He also ordered arterial studies although the ABIs in our clinic were normal as well as his clinical exam these were normal I do not think he needs to see vascular surgery. His ABIs at the [facility] were 1.22 in the right triphasic  waveforms with a normal TBI of 1.15 on the left ABI of 1.22 with triphasic waveforms and a normal TBI of 1.08. Finally he saw Dr. Amalia Hailey who will follow him in for 2 months. At this point I do not think he felt that he needed a procedure on the right calcaneal bone. Dr. Juleen China is elected for broad-spectrum antibiotic The patient is still having pain in the right heel. He walks with a walker 2/3; wounds are generally smaller. He is tolerating his IV antibiotics. I believe this is vancomycin and ceftriaxone. We are still waiting for Oasis burn in terms of his out-of-pocket max which he should be meeting soon given the IV antibiotics, MRIs etc. I have asked him to check in on this. We are using silver collagen in the meantime the wounds look better 2/10; tolerating IV vancomycin and Rocephin. We are waiting to apply for Oasis. Although I am not really sure where he is in his out-of-pocket max. 2/17 started the first application of Oasis trilayer. Still on antibiotics. The wounds have generally look better. The area on the left has a little more surface slough requiring debridement 2/58; second application of Oasis trilayer. The wound surface granulation is generally look better. The area on the left with undermining laterally I think is come in a bit. 10/08/2020 upon evaluation today patient is here today for [employer] application #3. Fortunately he seems to be doing extremely well with regard to this and we are seeing a lot of new epithelial growth which is great news. Fortunately there is no signs of active infection at this time. 10/16/2020 upon evaluation today patient appears to be doing well with regard to his foot ulcers. Do believe the Oasis has been of benefit for him. I do not see any signs of infection right  now which is great news and I think that he has a lot of new epithelial growth which is great to see as well. The patient is very pleased to hear all of this. I do think we can proceed  with the Oasis trilayer #4 today. 3/18; not as much improvement in these areas on his heels that I was hoping. I did reapply trilateral Oasis today the tissue looks healthier but not as much fill in as I was hoping. 3/25; better looking today I think this is come in a bit the tissue looks healthier. Triple layer Oasis reapplied #6 4/1; somewhat better looking definitely better looking surface not as much change in surface area as I was hoping. He may be spending more time [place] on days then he needs to although he does have heel offloading boots. Triple layer Oasis reapplied #7 4/7; unfortunately apparently [facility] will not approve any further Oasis which is unfortunate since the patient did respond nicely both in terms of the condition of the wound bed as well as surface area. There is still some drainage coming from the wound but not a lot there does not appear to be any infection 4/15; we have been using Hydrofera Blue. He continues to have nice rims of epithelialization on the right greater than the left. The left the epithelialization is coming from the tip of his heel. There is moderate drainage. In this that concerns me about a total contact cast. There is no evidence of infection 4/29; patient has been using Hydrofera Blue with dressing changes. He has no complaints or issues today. 5/5; using Hydrofera Blue. I actually think that he looks marginally better than the last time I saw this 3 weeks ago. There are rims of epithelialization on the left thumb coming from the medial side on the right. Using Hydrofera Blue 5/12; using Hydrofera Blue. These continue to make improvements in surface area. His drainage was not listed as severe I therefore went ahead and put a cast on the left foot. Right foot we will continue to dress his previous 5/16; back for first total contact cast change. He did not tolerate this particularly well cast injury on the anterior tibia among other issues.  Difficulty sleeping. I talked him about this in some detail and afterwards is elected to continue. I told him I would like to have a cast on for 3 weeks to see if this is going to help at all. I think he agreed 5/19; I think the wound is better. There is no tunneling towards his midfoot. The undermining medially also looks better. He has a rim of new skin distally. I think we are making progress here. The area on the left also continues to look somewhat better to me using Hydrofera Blue. He has a list of complaints about the cast but none of them seem serious 5/26; patient presents for 1 week follow-up. He has been using a total contact cast and tolerating this well. Hydrofera Blue is the main dressing used. He denies signs of infection. 6/2 Hydrofera Blue total contact cast on the left. These were large ulcers that formed in intensive care unit where the patient was recovering from [place]. May have had something to do with being ventilated in an upright positiono Pressors etc. We have been able to get the areas down considerably and a viable surface. There is some epithelialization in both sides. Note made of drainage 6/9; changed to [facility] last time because of drainage.  He arrives with better looking surfaces and dimensions on the left than the right. Paradoxically the right actually probes more towards his midfoot the left is largely close down but both of these look improved. Using a total contact cast on the left 6/16; complex wounds on his bilateral plantar heels which were initially pressure injury from a stay in the ICU with COVID. We have been using silver alginate most recently. His dimensions of come in quite dramatically however not recently. We have been putting the left foot in a total contact cast 6/23; complex wounds on the bilateral plantar heels. I been putting the left in the cast paradoxically the area on the right is the one that is going towards closure at a faster rate.  Quite a bit of drainage on the left. The patient went to see Dr. Amalia Hailey who said he was going to standby for skin grafts. I had actually considered sending him for skin grafts however he would be mandatorily off his feet for a period of weeks to months. I am thinking that the area on the right is going to close on its own the area on the left has been more stubborn even though we have him in a total contact cast 6/30; took him out of a total contact cast last week is the right heel seem to be making better progress than the left where I was placing the cast. We are using silver alginate. Both wounds are smaller right greater than left 7/12; both wounds look as though they are making some progress. We are using silver alginate. Heel offloading boots 7/26; very gradual progress especially on the right. Using silver alginate. He is wearing heel offloading boots 8/18; he continues to close these wounds down very gradually. Using silver alginate. The problem polymen being definitive about this is areas of what appears to be callus around the margins. This is not a 100% of the area but certainly sizable especially on the right 9/1; bilateral plantar feet wounds secondary to prolonged pressure while being ventilated for COVID-19 in an upright position. Essentially pressure ulcers on the bottom of his feet. He is made substantial progress using silver alginate. 9/14; bilateral plantar feet wounds secondary to prolonged pressure. Making progress using silver alginate. 9/29 bilateral plantar feet wounds secondary to prolonged pressure. I changed him to Iodoflex last week. MolecuLight showing reddened blush fluorescence 10/11; patient presents for follow-up. He has no issues or complaints today. He denies signs of infection. He continues to use Iodoflex and antibiotic ointment to the wound beds. 10/27; 2-week follow-up. No evidence of infection. He has callus and thick dry skin around the wound margins we have  been using Iodoflex and Bactroban which was in response to a moderate left MolecuLight reddish blush fluorescence. 11/10; 2-week follow-up. Wound margins again have thick callus however the measurements of the actual wound sites are a lot smaller. Everything looks reasonably healthy here. We have been using Iodoflex He was approved for prime matrix but I have elected to delay this given the improvement in the surface area. Hopefully I will not regret that decision as were getting close to the end of the year in terms of insurance payment 12/8; 2-week follow-up. Wounds are generally smaller in size. These were initially substantial wounds extending into the forefoot all the way into the heel on the bilateral plantar feet. They are now both located on the plantar heel distal aspect both of these have a lot of callus around the wounds I  used a #5 curette to remove this on the right and the left also some subcutaneous debris to try and get the wound edges were using Iodoflex. He has heel offloading shoe 12/22; 2-week follow-up. Not really much improvement. He has thick callus around the outer edges of both wounds. I remove this there is some nonviable subcutaneous tissue as well. We have been using Iodoflex. Her intake nurse and myself spontaneously thought of a total contact cast I went back in May. At that time we really were not seeing much of an improvement with a cast although the wound was in a much different situation I would like to retry this in 2 weeks and I discussed this with the patient 08/12/2021; the patient has had some improvement with the Iodoflex. The the area on the left heel plantar more improved than the right. I had to put him in a total contact cast on the left although I decided to put that off for 2 weeks. I am going to change his primary dressing to silver collagen. I think in both areas he has had some improvement most of the healing seems to be more proximal in the heel. The  wounds are in the mid aspect. A lot of thick callus on the right heel however. 1/19; we are using silver collagen on both plantar heel areas. He has had some improvement today. The left did not require any debridement. He still had some eschar on the right that was debrided but both seem to have contracted. I did not put it total contact cast on him today 2/2 we have been using silver collagen. The area on the right plantar heel has areas that appear to be epithelialized interspersed with dry flaking callus and dry skin. I removed this. This really looks better than on the other side. On the left still a large area with raised edges and debris on the surface. 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 He has no issues or complaints today. 2/9; not much change in the left foot wound with 1 week of a cast we are using silver collagen. Silver collagen on the right side. The right side has been the better wound surface. We will reapply the total contact cast on the left 2/16; not much improvement on either side I been using silver collagen with a total contact cast on the left. I'm changing the Hydrofera Blue still with a total contact cast on the left 2/23; some improvement on both sides. Disappointing that he has thick callus around the area that we are putting in a total contact cast on the left. We've been using Hydrofera Blue on both wound areas. This is a man who at essentially pressure ulcers in addition to ischemia caused by medications to support his blood pressure (pressors) in the ICU. He was being ventilated in the standing position for severe Covid. A Shiley the wounds extended across his entire foot but are now localized to his plantar heels bilaterally. We have made progress however neither areas healed. I continue to think the total contact cast is helped  albeit painstakingly slowly. He has never wanted a plastic surgery consult although I don't know that they would be interested in grafting in area in this location. 10/07/2021: Continued improvement bilaterally. There is still some callus around the left wound, despite the total contact cast. He  has some increased pain in his right midfoot around 1 particular area. This has been painful in the past but seems to be a little bit worse. When his cast was removed today, there was an area on the heel of the left foot that looks a bit macerated. He is also complaining of pain in his left thigh and hip which he thinks is secondary to the limb length discrepancy caused by the cast. 10/14/2021: He continues to improve. A little bit less callus around the left wound. He continues to endorse pain in his right midfoot, but this is not as significant as it was last week. The maceration on his left heel is improved. 10/21/2021: Continued improvement to both wounds. The maceration on his left heel is no longer evident. Less callus bilaterally. Epithelialization progressing. 10/28/2021: Significant improvement this week. The right sided wound is nearly closed with just a small open area at the middle. No maceration seen on the left heel. Continued epithelialization on both sides. No concern for infection. 11/04/2021: T oday, the wounds were measured a little bit differently and come out as larger, but I actually think they are about the same to potentially even smaller, particularly on the left. He continues to accumulate some callus on the right. 11/11/2021: T oday, the patient is expressing some concern that the left wound, despite being in the total contact cast, is not progressing at the same rate as the 1 on the right. He is interested in trying a week without the cast to see how the wound does. The wounds are roughly the same size as last week, with the right perhaps being a little bit smaller. He continues to build up  callus on both sites. 11/18/2021: Last week, I permitted the patient to go without his total contact cast, just to see if the cast was really making any difference. Today, both wounds have deteriorated to some extent, suggesting that the cast is providing benefit, at least on the left. Both are larger and have accumulated callus, slough, and other debris. 11/26/2021: I debrided both wounds quite aggressively last week in an effort to stimulate the healing cascade. This appears to have been effective as the left sided wound is a full centimeter shorter in length. Although the right was measured slightly larger, on inspection, it looks as though an area of epithelialized tissue was included in the measurements. We have been using PolyMem Ag on the wound surfaces with a total contact cast to the left. 12/02/2021: It appears that the intake personnel are including epithelialized tissue in his wound measurements; the right wound is almost completely epithelialized; there is just a crater at the proximal midfoot with some open areas. On the left, he has built up some callus, but the overall wound surface looks good. There is some senescent skin around the wound margin. He has been in PolyMem Ag bilaterally with a total contact cast on the left. 12/09/2021: The right wound is nearly closed; there is just a small open area at the mid calcaneus. On the left, the wound is smaller with minimal callus buildup. No significant drainage. 12/16/2021: The right calcaneal wound remains minimally open at the mid calcaneus; the rest has epithelialized. On the left, the wound is also a little bit smaller. There is some senescent tissue on the periphery. He is getting his first application of a trial skin substitute called Vendaje today. 12/23/2021: The wound on his right calcaneus is nearly closed; there is just a small area at  the most distal aspect of the calcaneus that is open. On the left, the area where we applied to the  skin substitute has a healthier-looking bed of granulation tissue. The wound dimensions are not significantly different on this side but the wound surface is improved. 12/30/2021: The wound on the right calcaneus has not changed significantly aside from some accumulation of callus. On the left, the open area is smaller and continues to have an improved surface. He continues to accumulate callus around the wound. He is here for his third application of Vendaje. Patient History Information obtained from Patient. Family History Cancer - Maternal Grandparents, Diabetes - Father,Paternal Grandparents, Heart Disease - Maternal Grandparents, Hypertension - Father,Paternal Grandparents, Lung Disease - Siblings, No family history of Hereditary Spherocytosis, Kidney Disease, Seizures, Stroke, Thyroid Problems, Tuberculosis. Social History Never smoker, Marital Status - Married, Alcohol Use - Never, Drug Use - No History, Caffeine Use - Daily - tea, soda. Medical History Eyes Denies history of Cataracts, Glaucoma, Optic Neuritis Ear/Nose/Mouth/Throat Denies history of Chronic sinus problems/congestion, Middle ear problems Hematologic/Lymphatic Denies history of Anemia, Hemophilia, Human Immunodeficiency Virus, Lymphedema, Sickle Cell Disease Respiratory Patient has history of Asthma Denies history of Aspiration, Chronic Obstructive Pulmonary Disease (COPD), Pneumothorax, Sleep Apnea, Tuberculosis Cardiovascular Patient has history of Angina - with COVID, Hypertension Denies history of Arrhythmia, Congestive Heart Failure, Coronary Artery Disease, Deep Vein Thrombosis, Hypotension, Myocardial Infarction, Peripheral Arterial Disease, Peripheral Venous Disease, Phlebitis, Vasculitis Gastrointestinal Denies history of Cirrhosis , Colitis, Crohnoos, Hepatitis A, Hepatitis B, Hepatitis C Endocrine Denies history of Type I Diabetes, Type II Diabetes Genitourinary Denies history of End Stage Renal  Disease Immunological Denies history of Lupus Erythematosus, Raynaudoos, Scleroderma Integumentary (Skin) Denies history of History of Burn Musculoskeletal Denies history of Gout, Rheumatoid Arthritis, Osteoarthritis, Osteomyelitis Neurologic Denies history of Dementia, Neuropathy, Quadriplegia, Paraplegia, Seizure Disorder Oncologic Denies history of Received Chemotherapy, Received Radiation Psychiatric Denies history of Anorexia/bulimia, Confinement Anxiety Hospitalization/Surgery History - COVID PNA 07/22/2019- 11/14/2019. - 03/27/2020 wound debridement/ skin graft. Medical A Surgical History Notes nd Constitutional Symptoms (General Health) COVID PNA 07/22/2019-11/14/2019 [facility] ECMO, foot drop left foot , Genitourinary kidney stone Psychiatric anxiety Objective Constitutional Slightly hypertensive. No acute distress. Vitals Time Taken: 9:52 AM, Height: 69 in, Weight: 280 lbs, BMI: 41.3, Temperature: 98.7 F, Pulse: 76 bpm, Respiratory Rate: 18 breaths/min, Blood Pressure: 142/83 mmHg. Respiratory Normal work of breathing on room air. General Notes: 12/30/2021: The wound on the right calcaneus has not changed significantly aside from some accumulation of callus. On the left, the open area is smaller and continues to have an improved surface. He continues to accumulate callus around the wound. Integumentary (Hair, Skin) Wound #1 status is Open. Original cause of wound was Pressure Injury. The date acquired was: 10/07/2019. The wound has been in treatment 76 weeks. The wound is located on the Right Calcaneus. The wound measures 0.7cm length x 0.2cm width x 0.2cm depth; 0.11cm^2 area and 0.022cm^3 volume. There is Fat Layer (Subcutaneous Tissue) exposed. There is no tunneling or undermining noted. There is a medium amount of serosanguineous drainage noted. The wound margin is distinct with the outline attached to the wound base. There is medium (34-66%) red, pink granulation within  the wound bed. There is a medium (34-66%) amount of necrotic tissue within the wound bed including Eschar and Adherent Slough. Wound #2 status is Open. Original cause of wound was Pressure Injury. The date acquired was: 10/07/2019. The wound has been in treatment 76 weeks. The wound is located on  the Left Calcaneus. The wound measures 1.2cm length x 1cm width x 0.2cm depth; 0.942cm^2 area and 0.188cm^3 volume. There is Fat Layer (Subcutaneous Tissue) exposed. There is no tunneling or undermining noted. There is a medium amount of serosanguineous drainage noted. The wound margin is distinct with the outline attached to the wound base. There is small (1-33%) red, pink granulation within the wound bed. There is a large (67-100%) amount of necrotic tissue within the wound bed including Eschar and Adherent Slough. Assessment Active Problems ICD-10 Non-pressure chronic ulcer of other part of right foot with fat layer exposed Non-pressure chronic ulcer of other part of left foot with fat layer exposed Procedures Wound #1 Pre-procedure diagnosis of Wound #1 is a Pressure Ulcer located on the Right Calcaneus . There was a Selective/Open Wound Non-Viable Tissue Debridement with a total area of 0.14 sq cm performed by Fredirick Maudlin, MD. With the following instrument(s): Curette, and Rongeur to remove Non-Viable tissue/material. Material removed includes Callus and Slough and after achieving pain control using Lidocaine 4% Topical Solution. No specimens were taken. A time out was conducted at 10:36, prior to the start of the procedure. A Minimum amount of bleeding was controlled with Pressure. The procedure was tolerated well with a pain level of 0 throughout and a pain level of 0 following the procedure. Post Debridement Measurements: 0.7cm length x 0.2cm width x 0.2cm depth; 0.022cm^3 volume. Post debridement Stage noted as Category/Stage III. Character of Wound/Ulcer Post Debridement is improved. Post  procedure Diagnosis Wound #1: Same as Pre-Procedure Wound #2 Pre-procedure diagnosis of Wound #2 is a Pressure Ulcer located on the Left Calcaneus . There was a Selective/Open Wound Non-Viable Tissue Debridement with a total area of 1.2 sq cm performed by Fredirick Maudlin, MD. With the following instrument(s): Curette, and Rongeur to remove Non-Viable tissue/material. Material removed includes Callus and Slough and after achieving pain control using Lidocaine 4% Topical Solution. No specimens were taken. A time out was conducted at 10:36, prior to the start of the procedure. A Minimum amount of bleeding was controlled with Pressure. The procedure was tolerated well with a pain level of 0 throughout and a pain level of 0 following the procedure. Post Debridement Measurements: 1.2cm length x 1cm width x 0.2cm depth; 0.188cm^3 volume. Post debridement Stage noted as Category/Stage III. Character of Wound/Ulcer Post Debridement is improved. Post procedure Diagnosis Wound #2: Same as Pre-Procedure Plan 12/30/2021: The wound on the right calcaneus has not changed significantly aside from some accumulation of callus. On the left, the open area is smaller and continues to have an improved surface. He continues to accumulate callus around the wound. I used a curette to debride the periwound callus and slough from both heels. We will continue to use PolyMem Ag on the right. I placed the third application of Vendaje (trial product, no charge) on the left ulcer. We were able to get a larger piece today and I was able to cover the entire surface with the product. It was secured in standard fashion with Adaptic and Steri-Strips. We will reapply his total contact cast. Follow-up in 1 week. Electronic Signature(s) Signed: 12/30/2021 11:06:37 AM By: Fredirick Maudlin MD FACS Entered By: Fredirick Maudlin on 12/30/2021 11:06:37 -------------------------------------------------------------------------------- HxROS  Details Patient Name: Date of Service: [name], [place] 12/30/2021 9:30 A M Medical Record Number: [id number] Patient Account Number: [id number] Date of Birth/Sex: Treating RN: 01-Jun-1976 (46 y.o. [name] Primary Care Provider: [name] Other Clinician: Referring Provider: Treating Provider/Extender:  Fredirick Maudlin [name] Weeks in Treatment: 76 Information Obtained From Patient Constitutional Symptoms (General Health) Medical History: Past Medical History Notes: COVID PNA 07/22/2019-11/14/2019 [facility] ECMO, foot drop left foot , Eyes Medical History: Negative for: Cataracts; Glaucoma; Optic Neuritis Ear/Nose/Mouth/Throat Medical History: Negative for: Chronic sinus problems/congestion; Middle ear problems Hematologic/Lymphatic Medical History: Negative for: Anemia; Hemophilia; Human Immunodeficiency Virus; Lymphedema; Sickle Cell Disease Respiratory Medical History: Positive for: Asthma Negative for: Aspiration; Chronic Obstructive Pulmonary Disease (COPD); Pneumothorax; Sleep Apnea; Tuberculosis Cardiovascular Medical History: Positive for: Angina - with COVID; Hypertension Negative for: Arrhythmia; Congestive Heart Failure; Coronary Artery Disease; Deep Vein Thrombosis; Hypotension; Myocardial Infarction; Peripheral Arterial Disease; Peripheral Venous Disease; Phlebitis; Vasculitis Gastrointestinal Medical History: Negative for: Cirrhosis ; Colitis; Crohns; Hepatitis A; Hepatitis B; Hepatitis C Endocrine Medical History: Negative for: Type I Diabetes; Type II Diabetes Genitourinary Medical History: Negative for: End Stage Renal Disease Past Medical History Notes: kidney stone Immunological Medical History: Negative for: Lupus Erythematosus; Raynauds; Scleroderma Integumentary (Skin) Medical History: Negative for: History of Burn Musculoskeletal Medical History: Negative for: Gout; Rheumatoid Arthritis; Osteoarthritis;  Osteomyelitis Neurologic Medical History: Negative for: Dementia; Neuropathy; Quadriplegia; Paraplegia; Seizure Disorder Oncologic Medical History: Negative for: Received Chemotherapy; Received Radiation Psychiatric Medical History: Negative for: Anorexia/bulimia; Confinement Anxiety Past Medical History Notes: anxiety Immunizations Pneumococcal Vaccine: Received Pneumococcal Vaccination: No Implantable Devices None Hospitalization / Surgery History Type of Hospitalization/Surgery COVID PNA 07/22/2019- 11/14/2019 03/27/2020 wound debridement/ skin graft Family and Social History Cancer: Yes - Maternal Grandparents; Diabetes: Yes - Father,Paternal Grandparents; Heart Disease: Yes - Maternal Grandparents; Hereditary Spherocytosis: No; Hypertension: Yes - Father,Paternal Grandparents; Kidney Disease: No; Lung Disease: Yes - Siblings; Seizures: No; Stroke: No; Thyroid Problems: No; Tuberculosis: No; Never smoker; Marital Status - Married; Alcohol Use: Never; Drug Use: No History; Caffeine Use: Daily - tea, soda; Financial Concerns: No; Food, Clothing or Shelter Needs: No; Support System Lacking: No; Transportation Concerns: No Electronic Signature(s) Signed: 12/30/2021 11:07:57 AM By: Fredirick Maudlin MD FACS Signed: 12/30/2021 5:13:16 PM By: [name] Entered By: Fredirick Maudlin on 12/30/2021 11:04:40 -------------------------------------------------------------------------------- SuperBill Details Patient Name: Date of Service: [name], [place] 12/30/2021 Medical Record Number: [id number] Patient Account Number: [id number] Date of Birth/Sex: Treating RN: [birth year] (46 y.o. [name] Primary Care Provider: [name] Other Clinician: Referring Provider: Treating Provider/Extender: [name] in Treatment: 76 Diagnosis Coding ICD-10 Codes Code Description [phone] Non-pressure chronic ulcer of other part of right foot with fat layer  exposed L97.522 Non-pressure chronic ulcer of other part of left foot with fat layer exposed Facility Procedures CPT4 Code: [id number] Description: [phone] - DEBRIDE WOUND 1ST 20 SQ CM OR < ICD-10 Diagnosis Description L97.512 Non-pressure chronic ulcer of other part of right foot with fat layer exposed L97.522 Non-pressure chronic ulcer of other part of left foot with fat layer exposed Modifier: Quantity: 1 Physician Procedures : CPT4 Code Description Modifier [id number] - WC PHYS LEVEL 3 - EST PT 25 ICD-10 Diagnosis Description L97.512 Non-pressure chronic ulcer of other part of right foot with fat layer exposed L97.522 Non-pressure chronic ulcer of other part of left foot  with fat layer exposed Quantity: 1 : [id number] - WC PHYS DEBR WO ANESTH 20 SQ CM ICD-10 Diagnosis Description L97.512 Non-pressure chronic ulcer of other part of right foot with fat layer exposed L97.522 Non-pressure chronic ulcer of other part of left foot with fat layer exposed Quantity: 1 Electronic Signature(s) Signed: 12/30/2021 11:06:57 AM By: Fredirick Maudlin MD FACS Signed: 12/30/2021 11:06:57  AM By: Fredirick Maudlin MD FACS Entered By: Fredirick Maudlin on 12/30/2021 11:06:57

## 2022-01-06 ENCOUNTER — Encounter (HOSPITAL_BASED_OUTPATIENT_CLINIC_OR_DEPARTMENT_OTHER): Payer: PPO | Attending: General Surgery | Admitting: General Surgery

## 2022-01-06 ENCOUNTER — Ambulatory Visit (HOSPITAL_BASED_OUTPATIENT_CLINIC_OR_DEPARTMENT_OTHER): Payer: BC Managed Care – PPO | Admitting: General Surgery

## 2022-01-06 DIAGNOSIS — I10 Essential (primary) hypertension: Secondary | ICD-10-CM | POA: Insufficient documentation

## 2022-01-06 DIAGNOSIS — L97512 Non-pressure chronic ulcer of other part of right foot with fat layer exposed: Secondary | ICD-10-CM | POA: Insufficient documentation

## 2022-01-06 DIAGNOSIS — L97522 Non-pressure chronic ulcer of other part of left foot with fat layer exposed: Secondary | ICD-10-CM | POA: Insufficient documentation

## 2022-01-06 IMAGING — DX DG CHEST 1V PORT
1 series · 1 of 1 positions shown · non-contrast
Comparison: October 22, 2019.

CLINICAL DATA: Respiratory difficulty.

EXAM:
PORTABLE CHEST 1 VIEW

[chest]
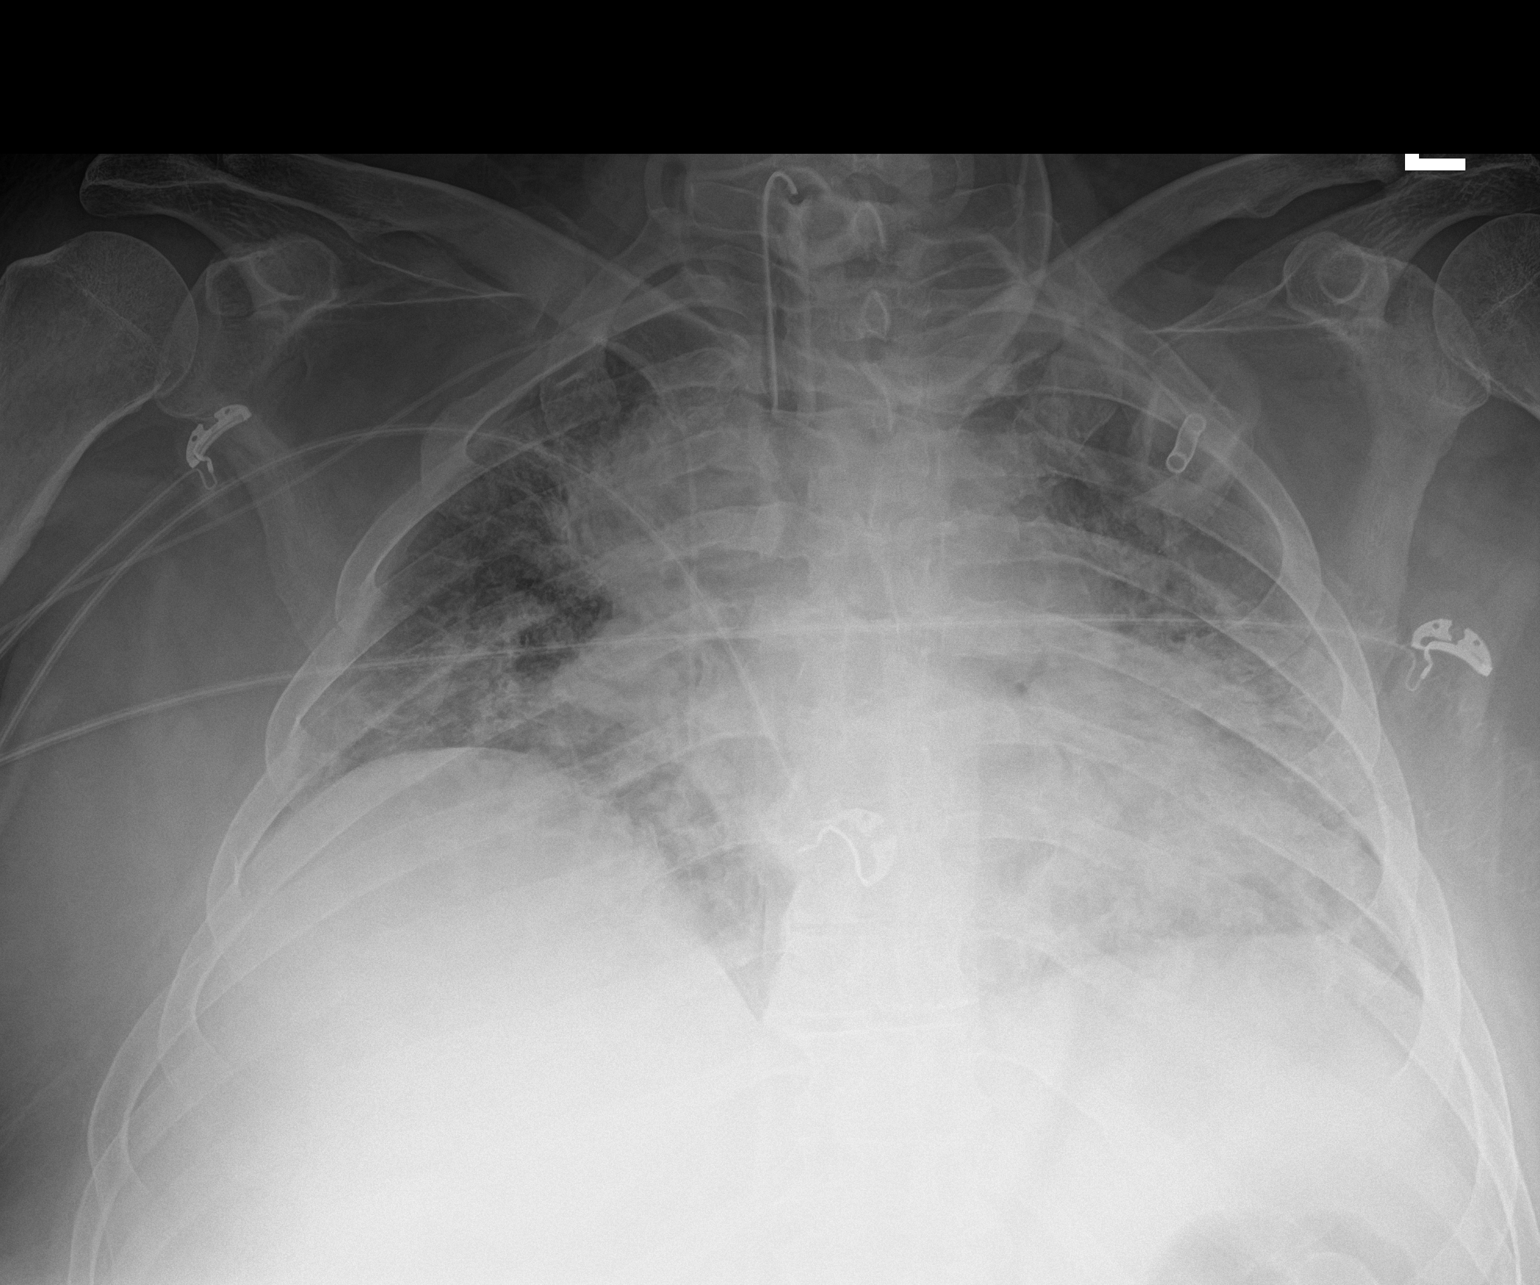

[1 of 1 positions shown; findings below may reference images not displayed]

FINDINGS: Stable cardiomegaly. Tracheostomy tube is unchanged in position.
Right-sided PICC line is unchanged. Hypoinflation of the lungs is
noted. No pneumothorax or pleural effusion is noted. Stable
bilateral lung opacities are noted concerning for multifocal
pneumonia. Bony thorax is unremarkable.
IMPRESSION: Stable bilateral lung opacities are noted concerning for multifocal
pneumonia.

## 2022-01-06 NOTE — Progress Notes (Signed)
[name], [name] ([id number]) Visit Report for 01/06/2022 Arrival Information Details Patient Name: Date of Service: [name], [employer] 01/06/2022 11:00 A M Medical Record Number: [id number] Patient Account Number: [id number] Date of Birth/Sex: Treating RN: [birth year] (46 y.o. [name] Primary Care [name]: [name] Other Clinician: Referring [name]: Treating [name]/Extender: [name] in Treatment: 98 Visit Information History Since Last Visit Added or deleted any medications: No Patient Arrived: Wheel Chair Any new allergies or adverse reactions: No Arrival Time: 11:02 Had a fall or experienced change in No Accompanied By: family member activities of daily living that may affect Transfer Assistance: None risk of falls: Patient Requires Transmission-Based Precautions: No Signs or symptoms of abuse/neglect since last visito No Patient Has Alerts: No Hospitalized since last visit: No Implantable device outside of the clinic excluding No cellular tissue based products placed in the center since last visit: Has Dressing in Place as Prescribed: Yes Pain Present Now: Yes Electronic Signature(s) Signed: 01/06/2022 6:53:41 PM By: [name] Entered By: [name] on 01/06/2022 11:03:22 -------------------------------------------------------------------------------- Encounter Discharge Information Details Patient Name: Date of Service: [name], [place]. 01/06/2022 11:00 A M Medical Record Number: [id number] Patient Account Number: [id number] Date of Birth/Sex: Treating RN: [birth year] (45 y.o. [name] Primary Care Iyanla Eilers: [name] Other Clinician: Referring Rahsaan Weakland: Treating Khyler Eschmann/Extender: [name] in Treatment: 4 Encounter Discharge Information Items Post Procedure Vitals Discharge Condition: Stable Temperature (F): 98.2 Ambulatory Status: Wheelchair Pulse (bpm): 87 Discharge  Destination: Home Respiratory Rate (breaths/min): 18 Transportation: Private Auto Blood Pressure (mmHg): 162/99 Accompanied By: AUNT Schedule Follow-up Appointment: Yes Clinical Summary of Care: Patient Declined Electronic Signature(s) Signed: 01/06/2022 6:53:41 PM By: [name] Entered By: [name] on 01/06/2022 18:52:58 -------------------------------------------------------------------------------- Lower Extremity Assessment Details Patient Name: Date of Service: [name], [place] 01/06/2022 11:00 A M Medical Record Number: [id number] Patient Account Number: [id number] Date of Birth/Sex: Treating RN: 02-09-1976 (46 y.o. [name] Primary Care Valin Massie: [name] Other Clinician: Referring [name]: Treating [name]/Extender: [name] in Treatment: 77 Edema Assessment Assessed: [name]: No] [name]: No] Edema: [Left: No] [Right: No] Calf Left: Right: Point of Measurement: 29 cm From Medial Instep 41 cm 43 cm Ankle Left: Right: Point of Measurement: 9 cm From Medial Instep 23.5 cm 23.1 cm Electronic Signature(s) Signed: 01/06/2022 6:53:41 PM By: [name] Entered By: [name] on 01/06/2022 11:22:54 -------------------------------------------------------------------------------- Multi Wound Chart Details Patient Name: Date of Service: [name], TO [place] 01/06/2022 11:00 A M Medical Record Number: [id number] Patient Account Number: [id number] Date of Birth/Sex: Treating RN: Mar 11, 1976 (46 y.o. [name] Primary Care [name]: [name] Other Clinician: Referring Alexzandra Bilton: Treating Keyonta Barradas/Extender: [name] in Treatment: 36 Vital Signs Height(in): 69 Pulse(bpm): 72 Weight(lbs): 280 Blood Pressure(mmHg): 162/99 Body Mass Index(BMI): 41.3 Temperature(F): 98.2 Respiratory Rate(breaths/min): 18 Photos: Right Calcaneus Left Calcaneus Right, Plantar Foot Wound  Location: Pressure Injury Pressure Injury Other Lesion Wounding Event: Pressure Ulcer Pressure Ulcer Lesion Primary Etiology: Asthma, Angina, Hypertension Asthma, Angina, Hypertension Asthma, Angina, Hypertension Comorbid History: 10/07/2019 10/07/2019 12/10/2021 Date Acquired: 77 77 0 Weeks of Treatment: Open Open Open Wound Status: No No No Wound Recurrence: 0.3x0.2x0.2 0.9x0.7x0.2 0.3x0.3x0.1 Measurements L x W x D (cm) 0.047 0.495 0.071 A (cm) : rea 0.009 0.099 0.007 Volume (cm) : 99.90% 98.20% 0.00% % Reduction in Area: 100.00% 99.60% 0.00% % Reduction in Volume: Category/Stage III Category/Stage III Full Thickness Without  Exposed Classification: Support Structures Medium Medium Medium Exudate A mount: Serosanguineous Serosanguineous Serosanguineous Exudate Type: red, brown red, brown red, brown Exudate Color: Distinct, outline attached Distinct, outline attached N/A Wound Margin: Large (67-100%) Large (67-100%) Large (67-100%) Granulation A mount: Red, Pink Red, Pink Pink Granulation Quality: Small (1-33%) Small (1-33%) None Present (0%) Necrotic A mount: Eschar, Adherent Slough Adherent Slough N/A Necrotic Tissue: Fat Layer (Subcutaneous Tissue): Yes Fat Layer (Subcutaneous Tissue): Yes Fat Layer (Subcutaneous Tissue): Yes Exposed Structures: Fascia: No Fascia: No Fascia: No Tendon: No Tendon: No Tendon: No Muscle: No Muscle: No Muscle: No Joint: No Joint: No Joint: No Bone: No Bone: No Bone: No Medium (34-66%) Medium (34-66%) Medium (34-66%) Epithelialization: Debridement - Selective/Open Wound Debridement - Selective/Open Wound N/A Debridement: Pre-procedure Verification/Time Out 11:40 11:40 N/A Taken: Lidocaine 5% topical ointment Lidocaine 5% topical ointment N/A Pain Control: Callus Callus N/A Tissue Debrided: Non-Viable Tissue Non-Viable Tissue N/A Level: 0.06 0.63 N/A Debridement A (sq cm): rea Curette Curette  N/A Instrument: Minimum Minimum N/A Bleeding: Pressure Pressure N/A Hemostasis A chieved: 0 0 N/A Procedural Pain: 0 0 N/A Post Procedural Pain: Procedure was tolerated well Procedure was tolerated well N/A Debridement Treatment Response: 0.3x0.2x0.2 0.9x0.7x0.2 N/A Post Debridement Measurements L x W x D (cm) 0.009 0.099 N/A Post Debridement Volume: (cm) Category/Stage III Category/Stage III N/A Post Debridement Stage: N/A periwound has macerated skin N/A Assessment Notes: Debridement N/A N/A Procedures Performed: Treatment Notes Electronic Signature(s) Signed: 01/06/2022 12:15:32 PM By: Fredirick Maudlin MD FACS Signed: 01/06/2022 6:53:41 PM By: [name] Entered By: Fredirick Maudlin on 01/06/2022 12:15:32 -------------------------------------------------------------------------------- Multi-Disciplinary Care Plan Details Patient Name: Date of Service: [name], [name] 01/06/2022 11:00 A M Medical Record Number: [id number] Patient Account Number: [id number] Date of Birth/Sex: Treating RN: 12-Nov-1975 (46 y.o. [name] Primary Care Strother Everitt: [name] Other Clinician: Referring Anastasya Jewell: Treating Joniel Graumann/Extender: [name] in Treatment: 31 [place] reviewed with physician Active Inactive Wound/Skin Impairment Nursing Diagnoses: Impaired tissue integrity Knowledge deficit related to ulceration/compromised skin integrity Goals: Patient/caregiver will verbalize understanding of skin care regimen Date Initiated: 07/15/2020 Target Resolution Date: 02/04/2022 Goal Status: Active Ulcer/skin breakdown will have a volume reduction of 30% by week 4 Date Initiated: 07/15/2020 Date Inactivated: 08/20/2020 Target Resolution Date: 09/03/2020 Goal Status: Unmet Unmet Reason: no major changes. Ulcer/skin breakdown will heal within 14 weeks Date Initiated: 12/04/2020 Date Inactivated: 12/10/2020 Target Resolution Date:  12/10/2020 Unmet Reason: wounds still open at 14 Goal Status: Unmet weeks and today 21 weeks. Interventions: Assess patient/caregiver ability to obtain necessary supplies Assess patient/caregiver ability to perform ulcer/skin care regimen upon admission and as needed Assess ulceration(s) every visit Provide education on ulcer and skin care Treatment Activities: Skin care regimen initiated : 07/15/2020 Topical wound management initiated : 07/15/2020 Notes: Electronic Signature(s) Signed: 01/06/2022 6:53:41 PM By: [name] Entered By: [name] on 01/06/2022 18:51:01 -------------------------------------------------------------------------------- Pain Assessment Details Patient Name: Date of Service: [name], [place] 01/06/2022 11:00 A M Medical Record Number: [id number] Patient Account Number: [id number] Date of Birth/Sex: Treating RN: [birth year] (46 y.o. [name] Primary Care Aquanetta Schwarz: [name] Other Clinician: Referring Azarie Coriz: Treating Taeko Schaffer/Extender: [name] in Treatment: 43 Active Problems Location of Pain Severity and Description of Pain Patient Has Paino Yes Site Locations Pain Location: Generalized Pain With Dressing Change: No Duration of the Pain. Constant / Intermittento Constant Rate the pain. Current Pain Level: 2 Worst Pain Level: 10 Least Pain Level:  2 Tolerable Pain Level: 2 Character of Pain Describe the Pain: Difficult to Pinpoint Pain Management and Medication Current Pain Management: Medication: No Cold Application: No Rest: Yes Massage: No Activity: No T.E.N.S.: No Heat Application: No Leg drop or elevation: No Is the Current Pain Management Adequate: Adequate How does your wound impact your activities of daily livingo Sleep: No Bathing: No Appetite: No Relationship With Others: No Bladder Continence: No Emotions: No Bowel Continence: No Work: No Toileting: No Drive:  No Dressing: No Hobbies: No Electronic Signature(s) Signed: 01/06/2022 6:53:41 PM By: [name] Entered By: [name] on 01/06/2022 11:04:22 -------------------------------------------------------------------------------- Patient/Caregiver Education Details Patient Name: Date of Service: [name], [name] 6/1/2023andnbsp11:00 A M Medical Record Number: [id number] Patient Account Number: [id number] Date of Birth/Gender: Treating RN: 03/24/1976 (46 y.o. [name] Primary Care Physician: [name] Other Clinician: Referring Physician: Treating Physician/Extender: [name] in Treatment: 79 Education Assessment Education Provided To: Patient Education Topics Provided Wound/Skin Impairment: Methods: Explain/Verbal Responses: Return demonstration correctly Electronic Signature(s) Signed: 01/06/2022 6:53:41 PM By: [name] Entered By: [name] on 01/06/2022 18:51:20 -------------------------------------------------------------------------------- Wound Assessment Details Patient Name: Date of Service: [name], [place] 01/06/2022 11:00 A M Medical Record Number: [id number] Patient Account Number: [id number] Date of Birth/Sex: Treating RN: [birth year] (46 y.o. [name] Primary Care Delmar Arriaga: [name] Other Clinician: Referring Jeremias Broyhill: Treating Ajamu Maxon/Extender: [name] in Treatment: 77 Wound Status Wound Number: 1 Primary Etiology: Pressure Ulcer Wound Location: Right Calcaneus Wound Status: Open Wounding Event: Pressure Injury Comorbid History: Asthma, Angina, Hypertension Date Acquired: 10/07/2019 Weeks Of Treatment: 77 Clustered Wound: No Photos Wound Measurements Length: (cm) 0.3 Width: (cm) 0.2 Depth: (cm) 0.2 Area: (cm) 0.047 Volume: (cm) 0.009 % Reduction in Area: 99.9% % Reduction in Volume: 100% Epithelialization: Medium (34-66%) Tunneling:  No Undermining: No Wound Description Classification: Category/Stage III Wound Margin: Distinct, outline attached Exudate Amount: Medium Exudate Type: Serosanguineous Exudate Color: red, brown Foul Odor After Cleansing: No Slough/Fibrino Yes Wound Bed Granulation Amount: Large (67-100%) Exposed Structure Granulation Quality: Red, Pink Fascia Exposed: No Necrotic Amount: Small (1-33%) Fat Layer (Subcutaneous Tissue) Exposed: Yes Necrotic Quality: Eschar, Adherent Slough Tendon Exposed: No Muscle Exposed: No Joint Exposed: No Bone Exposed: No Treatment Notes Wound #1 (Calcaneus) Wound Laterality: Right Cleanser Normal Saline Discharge Instruction: Cleanse the wound with Normal Saline prior to applying a clean dressing using gauze sponges, not tissue or cotton balls. Wound Cleanser Discharge Instruction: Cleanse the wound with wound cleanser prior to applying a clean dressing using gauze sponges, not tissue or cotton balls. Peri-Wound Care Topical Primary Dressing PolyMem Silver Non-Adhesive Dressing, 4.25x4.25 in Discharge Instruction: Apply to wound bed as instructed Secondary Dressing Zetuvit Plus 4x8 in Discharge Instruction: Apply over primary dressing as directed. Secured With Elastic Bandage 4 inch (ACE bandage) Discharge Instruction: Secure with ACE bandage as directed. Kerlix Roll Sterile, 4.5x3.1 (in/yd) Discharge Instruction: Secure with Kerlix as directed. Compression Wrap Compression Stockings Add-Ons Electronic Signature(s) Signed: 01/06/2022 6:53:41 PM By: [name] Signed: 01/06/2022 6:53:41 PM By: [name] Entered By: [name] on 01/06/2022 11:32:18 -------------------------------------------------------------------------------- Wound Assessment Details Patient Name: Date of Service: [name], [place] 01/06/2022 11:00 A M Medical Record Number: [id number] Patient Account Number: [id number] Date of Birth/Sex: Treating  RN: 1976/07/08 (46 y.o. [name] Primary Care Limuel Nieblas: [name] Other Clinician: Referring Taytum Wheller: Treating Shammara Jarrett/Extender: [name] in Treatment: 66 Wound Status Wound Number: 2  Primary Etiology: Pressure Ulcer Wound Location: Left Calcaneus Wound Status: Open Wounding Event: Pressure Injury Comorbid History: Asthma, Angina, Hypertension Date Acquired: 10/07/2019 Weeks Of Treatment: 77 Clustered Wound: No Photos Wound Measurements Length: (cm) 0.9 Width: (cm) 0.7 Depth: (cm) 0.2 Area: (cm) 0.495 Volume: (cm) 0.099 % Reduction in Area: 98.2% % Reduction in Volume: 99.6% Epithelialization: Medium (34-66%) Tunneling: No Undermining: No Wound Description Classification: Category/Stage III Wound Margin: Distinct, outline attached Exudate Amount: Medium Exudate Type: Serosanguineous Exudate Color: red, brown Foul Odor After Cleansing: No Slough/Fibrino Yes Wound Bed Granulation Amount: Large (67-100%) Exposed Structure Granulation Quality: Red, Pink Fascia Exposed: No Necrotic Amount: Small (1-33%) Fat Layer (Subcutaneous Tissue) Exposed: Yes Necrotic Quality: Adherent Slough Tendon Exposed: No Muscle Exposed: No Joint Exposed: No Bone Exposed: No Assessment Notes periwound has macerated skin Treatment Notes Wound #2 (Calcaneus) Wound Laterality: Left Cleanser Normal Saline Discharge Instruction: Cleanse the wound with Normal Saline prior to applying a clean dressing using gauze sponges, not tissue or cotton balls. Wound Cleanser Discharge Instruction: Cleanse the wound with wound cleanser prior to applying a clean dressing using gauze sponges, not tissue or cotton balls. Peri-Wound Care Zinc Oxide Ointment 30g tube Discharge Instruction: Apply Zinc Oxide to periwound with each dressing change Topical Primary Dressing Vendaje Secondary Dressing Zetuvit Plus 4x8 in Discharge Instruction: Apply over primary  dressing as directed. Secured With [employer], 4.5x3.1 (in/yd) Discharge Instruction: Secure with Kerlix as directed. 45M Medipore H Soft Cloth Surgical T ape, 4 x 10 (in/yd) Discharge Instruction: Secure with tape as directed. Compression Wrap Compression Stockings Add-Ons Electronic Signature(s) Signed: 01/06/2022 6:53:41 PM By: [name] Entered By: [name] on 01/06/2022 11:33:01 -------------------------------------------------------------------------------- Wound Assessment Details Patient Name: Date of Service: [name], [place] 01/06/2022 11:00 A M Medical Record Number: [id number] Patient Account Number: [id number] Date of Birth/Sex: Treating RN: 06-07-1976 (46 y.o. [name] Primary Care Karly Pitter: [name] Other Clinician: Referring Micole Delehanty: Treating Takela Varden/Extender: [name] in Treatment: 77 Wound Status Wound Number: 4 Primary Etiology: Lesion Wound Location: Left, Plantar Foot Wound Status: Open Wounding Event: Other Lesion Comorbid History: Asthma, Angina, Hypertension Date Acquired: 12/10/2021 Weeks Of Treatment: 0 Clustered Wound: No Photos Wound Measurements Length: (cm) 0.3 Width: (cm) 0.3 Depth: (cm) 0.1 Area: (cm) 0.071 Volume: (cm) 0.007 % Reduction in Area: 0% % Reduction in Volume: 0% Epithelialization: Medium (34-66%) Tunneling: No Undermining: No Wound Description Classification: Full Thickness Without Exposed Support Structures Exudate Amount: Medium Exudate Type: Serosanguineous Exudate Color: red, brown Foul Odor After Cleansing: No Slough/Fibrino No Wound Bed Granulation Amount: Large (67-100%) Exposed Structure Granulation Quality: Pink Fascia Exposed: No Necrotic Amount: None Present (0%) Fat Layer (Subcutaneous Tissue) Exposed: Yes Tendon Exposed: No Muscle Exposed: No Joint Exposed: No Bone Exposed: No Treatment Notes Wound #4 (Foot) Wound Laterality:  Plantar, Left Cleanser Soap and Water Discharge Instruction: May shower and wash wound with dial antibacterial soap and water prior to dressing change. Wound Cleanser Discharge Instruction: Cleanse the wound with wound cleanser prior to applying a clean dressing using gauze sponges, not tissue or cotton balls. Peri-Wound Care Topical Primary Dressing KerraCel Ag Gelling Fiber Dressing, 2x2 in (silver alginate) Discharge Instruction: Apply silver alginate to wound bed as instructed Secondary Dressing Secured With Kerlix Roll Sterile, 4.5x3.1 (in/yd) Discharge Instruction: Secure with Kerlix as directed. Paper Tape, 2x10 (in/yd) Discharge Instruction: Secure dressing with tape as directed. Compression Wrap Compression Stockings Add-Ons Electronic Signature(s) Signed: 01/06/2022 6:53:41 PM By: [name] Entered  By: [name] on 01/06/2022 12:38:09 -------------------------------------------------------------------------------- Vitals Details Patient Name: Date of Service: [name], [place] 01/06/2022 11:00 A M Medical Record Number: [id number] Patient Account Number: [id number] Date of Birth/Sex: Treating RN: 09/21/1975 (46 y.o. [name] Primary Care Dalaina Tates: [name] Other Clinician: Referring Karlyn Glasco: Treating Demaya Hardge/Extender: [name] in Treatment: 1 Vital Signs Time Taken: 11:04 Temperature (F): 98.2 Height (in): 69 Pulse (bpm): 87 Weight (lbs): 280 Respiratory Rate (breaths/min): 18 Body Mass Index (BMI): 41.3 Blood Pressure (mmHg): 162/99 Reference Range: 80 - 120 mg / dl Electronic Signature(s) Signed: 01/06/2022 6:53:41 PM By: [name] Entered By: [name] on 01/06/2022 11:03:45

## 2022-01-07 DIAGNOSIS — U071 COVID-19: Secondary | ICD-10-CM | POA: Diagnosis not present

## 2022-01-07 DIAGNOSIS — G4734 Idiopathic sleep related nonobstructive alveolar hypoventilation: Secondary | ICD-10-CM | POA: Diagnosis not present

## 2022-01-07 NOTE — Progress Notes (Signed)
TADHG, ESKEW ([id number]) Visit Report for 01/06/2022 Chief Complaint Document Details Patient Name: Date of Service: [name], [place] 01/06/2022 11:00 A M Medical Record Number: [id number] Patient Account Number: [id number] Date of Birth/Sex: Treating RN: October 14, 1975 (46 y.o. [name] Primary Care Provider: [name] Other Clinician: Referring Provider: Treating Provider/Extender: [name] in Treatment: 22 Information Obtained from: Patient Chief Complaint Bilateral Plantar Foot Ulcers Electronic Signature(s) Signed: 01/06/2022 12:16:04 PM By: Fredirick Maudlin MD FACS Entered By: Fredirick Maudlin on 01/06/2022 12:16:04 -------------------------------------------------------------------------------- Cellular or Tissue Based Product Details Patient Name: Date of Service: [name], [place] 01/06/2022 11:00 A M Medical Record Number: [id number] Patient Account Number: [id number] Date of Birth/Sex: Treating RN: [birth year] (46 y.o. [name] Primary Care Provider: [name] Other Clinician: Referring Provider: Treating Provider/Extender: [name] in Treatment: 77 Cellular or Tissue Based Product Type Wound #2 Left Calcaneus Applied to: Performed By: Physician Fredirick Maudlin, MD Cellular or Tissue Based Product Type: Other Level of Consciousness (Pre-procedure): Awake and Alert Pre-procedure Verification/Time Out Yes - 11:40 Taken: Location: genitalia / hands / feet / multiple digits Wound Size (sq cm): 0.63 Product Size (sq cm): 8 Waste Size (sq cm): 0 Amount of Product Applied (sq cm): 8 Instrument Used: Forceps Lot #: [id number] Order #: 4 Expiration Date: 10/12/2024 Fenestrated: No Reconstituted: No Secured: Yes Secured With: Steri-Strips Dressing Applied: Yes Primary Dressing: ADAPTIC;GAUZE Procedural Pain: 0 Post Procedural Pain: 0 Response to Treatment: Procedure was tolerated well Level  of Consciousness (Post- Awake and Alert procedure): Post Procedure Diagnosis Same as Pre-procedure Electronic Signature(s) Signed: 01/06/2022 6:53:41 PM By: [name] Signed: 01/07/2022 7:36:54 AM By: Fredirick Maudlin MD FACS Entered By: [name] on 01/06/2022 18:41:49 -------------------------------------------------------------------------------- Debridement Details Patient Name: Date of Service: [name], [place] 01/06/2022 11:00 A M Medical Record Number: [id number] Patient Account Number: [id number] Date of Birth/Sex: Treating RN: [birth year] (46 y.o. [name] Primary Care Provider: [name] Other Clinician: Referring Provider: Treating Provider/Extender: [name] in Treatment: 77 Debridement Performed for Assessment: Wound #1 Right Calcaneus Performed By: Physician Fredirick Maudlin, MD Debridement Type: Debridement Level of Consciousness (Pre-procedure): Awake and Alert Pre-procedure Verification/Time Out Yes - 11:40 Taken: Start Time: 11:40 Pain Control: Lidocaine 5% topical ointment T Area Debrided (L x W): otal 0.3 (cm) x 0.2 (cm) = 0.06 (cm) Tissue and other material debrided: Non-Viable, Callus Level: Non-Viable Tissue Debridement Description: Selective/Open Wound Instrument: Curette Bleeding: Minimum Hemostasis Achieved: Pressure End Time: 11:42 Procedural Pain: 0 Post Procedural Pain: 0 Response to Treatment: Procedure was tolerated well Level of Consciousness (Post- Awake and Alert procedure): Post Debridement Measurements of Total Wound Length: (cm) 0.3 Stage: Category/Stage III Width: (cm) 0.2 Depth: (cm) 0.2 Volume: (cm) 0.009 Character of Wound/Ulcer Post Debridement: Improved Post Procedure Diagnosis Same as Pre-procedure Electronic Signature(s) Signed: 01/06/2022 12:38:17 PM By: Fredirick Maudlin MD FACS Signed: 01/06/2022 6:53:41 PM By: [name] Entered By: [name] on 01/06/2022  11:56:17 -------------------------------------------------------------------------------- Debridement Details Patient Name: Date of Service: [name], [place] 01/06/2022 11:00 A M Medical Record Number: [id number] Patient Account Number: [id number] Date of Birth/Sex: Treating RN: 08/17/1975 (46 y.o. [name] Primary Care Provider: [name] Other Clinician: Referring Provider: Treating Provider/Extender: [name] in Treatment: 77 Debridement Performed for Assessment: Wound #2 Left Calcaneus Performed By: Physician Fredirick Maudlin, MD Debridement Type: Debridement Level of Consciousness (Pre-procedure): Awake and Alert Pre-procedure Verification/Time  Out Yes - 11:40 Taken: Start Time: 11:40 Pain Control: Lidocaine 5% topical ointment T Area Debrided (L x W): otal 0.9 (cm) x 0.7 (cm) = 0.63 (cm) Tissue and other material debrided: Non-Viable, Callus Level: Non-Viable Tissue Debridement Description: Selective/Open Wound Instrument: Curette Bleeding: Minimum Hemostasis Achieved: Pressure End Time: 11:42 Procedural Pain: 0 Post Procedural Pain: 0 Response to Treatment: Procedure was tolerated well Level of Consciousness (Post- Awake and Alert procedure): Post Debridement Measurements of Total Wound Length: (cm) 0.9 Stage: Category/Stage III Width: (cm) 0.7 Depth: (cm) 0.2 Volume: (cm) 0.099 Character of Wound/Ulcer Post Debridement: Improved Post Procedure Diagnosis Same as Pre-procedure Electronic Signature(s) Signed: 01/06/2022 12:38:17 PM By: Fredirick Maudlin MD FACS Signed: 01/06/2022 6:53:41 PM By: [name] Entered By: [name] on 01/06/2022 12:37:07 -------------------------------------------------------------------------------- HPI Details Patient Name: Date of Service: [name], [place] 01/06/2022 11:00 A M Medical Record Number: [id number] Patient Account Number: [id number] Date of Birth/Sex: Treating  RN: [birth year] (46 y.o. [name] Primary Care Provider: [name] Other Clinician: Referring Provider: Treating Provider/Extender: [name] in Treatment: 2 History of Present Illness HPI Description: Wounds are12/03/2020 upon evaluation today patient presents for initial inspection here in our clinic concerning issues he has been having with the bottoms of his feet bilaterally. He states these actually occurred as wounds when he was hospitalized for 5 months secondary to Covid. He was apparently with tilting bed where he was in an upright position quite frequently and apparently this occurred in some way shape or form during that time. Fortunately there is no sign of active infection at this time. No fevers, chills, nausea, vomiting, or diarrhea. With that being said he still has substantial wounds on the plantar aspects of his feet Theragen require quite a bit of work to get these to heal. He has been using Santyl currently though that is been problematic both in receiving the medication as well as actually paid for it as it is become quite expensive. Prior to the experience with Covid the patient really did not have any major medical problems other than hypertension he does have some mild generalized weakness following the Covid experience. 07/22/2020 on evaluation today patient appears to be doing okay in regard to his foot ulcers I feel like the wound beds are showing signs of better improvement that I do believe the Iodoflex is helping in this regard. With that being said he does have a lot of drainage currently and this is somewhat blue/green in nature which is consistent with Pseudomonas. I do think a culture today would be appropriate for [place] to evaluate and see if that is indeed the case I would likely start him on antibiotic orally as well he is not allergic to Cipro knows of no issues he has had in the past 12/21; patient was admitted to the clinic  earlier this month with bilateral presumed pressure ulcers on the bottom of his feet apparently related to excessive pressure from a tilt table arrangement in the intensive care unit. Patient relates this to being on ECMO but I am not really sure that is exactly related to that. I must say I have never seen anything like this. He has fairly extensive full-thickness wounds extending from his heel towards his midfoot mostly centered laterally. There is already been some healing distally. He does not appear to have an arterial issue. He has been using gentamicin to the wound surfaces with Iodoflex to help with ongoing debridement 1/6; this is  a patient with pressure ulcers on the bottom of his feet related to excessive pressure from a standing position in the intensive care unit. He is complaining of a lot of pain in the right heel. He is not a diabetic. 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 We have been using Iodoflex to help prepare the surfaces of both wounds for an advanced treatment product. He is nonambulatory spending most of his time in a wheelchair I have asked him not to propel the wheelchair with his heels 1/13; in general his wounds look better not much surface area change we have been using Iodoflex as of last week. I did an x-ray of the right heel as the patient was complaining of pain. I had some thoughts about a stress fracture perhaps Achilles tendon problems however what it showed was erosive changes along the inferior aspect of the calcaneus he now has a MRI booked for 1/20. 1/20; in general his wounds continue to be better. Some improvement in the large narrow areas proximally in his foot. He is still complaining of pain in the right heel and tenderness in certain areas of this wound. His MRI is tonight. I am not just looking for osteomyelitis that was brought up on the x-ray I am wondering about stress fractures, tendon ruptures etc. He has no such findings  on the left. Also noteworthy is that the patient had critical illness neuropathy and some of the discomfort may be actual improvement in nerve function I am just not sure. These wounds were initially in the setting of severe critical illness related to COVID-19. He was put in a standing position. He may have also been on pressors at the point contributing to tissue ischemia. By his description at some point these wounds were grossly necrotic extending proximally up into the Achilles part of his heel. I do not know that I have ever really seen pictures of them like this although they may exist in epic We have ordered Tri layer Oasis. I am trying to stimulate some granulation in these areas. This is of course assuming the MRI is negative for infection 1/27; since the patient was last here he saw Dr. Juleen China of infectious disease. He is planned for vancomycin and ceftriaxone. Prior operative culture grew MSSA. Also ordered baseline lab work. He also ordered arterial studies although the ABIs in our clinic were normal as well as his clinical exam these were normal I do not think he needs to see vascular surgery. His ABIs at the P[facility] were 1.22 in the right triphasic waveforms with a normal TBI of 1.15 on the left ABI of 1.22 with triphasic waveforms and a normal TBI of 1.08. Finally he saw Dr. Amalia Hailey who will follow him in for 2 months. At this point I do not think he felt that he needed a procedure on the right calcaneal bone. Dr. Juleen China is elected for broad-spectrum antibiotic The patient is still having pain in the right heel. He walks with a walker 2/3; wounds are generally smaller. He is tolerating his IV antibiotics. I believe this is vancomycin and ceftriaxone. We are still waiting for Oasis burn in terms of his out-of-pocket max which he should be meeting soon given the IV antibiotics, MRIs etc. I have asked him to check in on this. We are using silver collagen in the meantime the wounds look  better 2/10; tolerating IV vancomycin and Rocephin. We are waiting to apply for Oasis. Although I am not really  sure where he is in his out-of-pocket max. 2/17 started the first application of Oasis trilayer. Still on antibiotics. The wounds have generally look better. The area on the left has a little more surface slough requiring debridement 6/96; second application of Oasis trilayer. The wound surface granulation is generally look better. The area on the left with undermining laterally I think is come in a bit. 10/08/2020 upon evaluation today patient is here today for [employer] application #3. Fortunately he seems to be doing extremely well with regard to this and we are seeing a lot of new epithelial growth which is great news. Fortunately there is no signs of active infection at this time. 10/16/2020 upon evaluation today patient appears to be doing well with regard to his foot ulcers. Do believe the Oasis has been of benefit for him. I do not see any signs of infection right now which is great news and I think that he has a lot of new epithelial growth which is great to see as well. The patient is very pleased to hear all of this. I do think we can proceed with the Oasis trilayer #4 today. 3/18; not as much improvement in these areas on his heels that I was hoping. I did reapply trilateral Oasis today the tissue looks healthier but not as much fill in as I was hoping. 3/25; better looking today I think this is come in a bit the tissue looks healthier. Triple layer Oasis reapplied #6 4/1; somewhat better looking definitely better looking surface not as much change in surface area as I was hoping. He may be spending more time [place] on days then he needs to although he does have heel offloading boots. Triple layer Oasis reapplied #7 4/7; unfortunately apparently [facility] will not approve any further Oasis which is unfortunate since the patient did respond nicely both in terms of  the condition of the wound bed as well as surface area. There is still some drainage coming from the wound but not a lot there does not appear to be any infection 4/15; we have been using Hydrofera Blue. He continues to have nice rims of epithelialization on the right greater than the left. The left the epithelialization is coming from the tip of his heel. There is moderate drainage. In this that concerns me about a total contact cast. There is no evidence of infection 4/29; patient has been using Hydrofera Blue with dressing changes. He has no complaints or issues today. 5/5; using Hydrofera Blue. I actually think that he looks marginally better than the last time I saw this 3 weeks ago. There are rims of epithelialization on the left thumb coming from the medial side on the right. Using Hydrofera Blue 5/12; using Hydrofera Blue. These continue to make improvements in surface area. His drainage was not listed as severe I therefore went ahead and put a cast on the left foot. Right foot we will continue to dress his previous 5/16; back for first total contact cast change. He did not tolerate this particularly well cast injury on the anterior tibia among other issues. Difficulty sleeping. I talked him about this in some detail and afterwards is elected to continue. I told him I would like to have a cast on for 3 weeks to see if this is going to help at all. I think he agreed 5/19; I think the wound is better. There is no tunneling towards his midfoot. The undermining medially also looks better. He has  a rim of new skin distally. I think we are making progress here. The area on the left also continues to look somewhat better to me using Hydrofera Blue. He has a list of complaints about the cast but none of them seem serious 5/26; patient presents for 1 week follow-up. He has been using a total contact cast and tolerating this well. Hydrofera Blue is the main dressing used. He denies signs of  infection. 6/2 Hydrofera Blue total contact cast on the left. These were large ulcers that formed in intensive care unit where the patient was recovering from [place]. May have had something to do with being ventilated in an upright positiono Pressors etc. We have been able to get the areas down considerably and a viable surface. There is some epithelialization in both sides. Note made of drainage 6/9; changed to [facility] last time because of drainage. He arrives with better looking surfaces and dimensions on the left than the right. Paradoxically the right actually probes more towards his midfoot the left is largely close down but both of these look improved. Using a total contact cast on the left 6/16; complex wounds on his bilateral plantar heels which were initially pressure injury from a stay in the ICU with COVID. We have been using silver alginate most recently. His dimensions of come in quite dramatically however not recently. We have been putting the left foot in a total contact cast 6/23; complex wounds on the bilateral plantar heels. I been putting the left in the cast paradoxically the area on the right is the one that is going towards closure at a faster rate. Quite a bit of drainage on the left. The patient went to see Dr. Amalia Hailey who said he was going to standby for skin grafts. I had actually considered sending him for skin grafts however he would be mandatorily off his feet for a period of weeks to months. I am thinking that the area on the right is going to close on its own the area on the left has been more stubborn even though we have him in a total contact cast 6/30; took him out of a total contact cast last week is the right heel seem to be making better progress than the left where I was placing the cast. We are using silver alginate. Both wounds are smaller right greater than left 7/12; both wounds look as though they are making some progress. We are using silver alginate.  Heel offloading boots 7/26; very gradual progress especially on the right. Using silver alginate. He is wearing heel offloading boots 8/18; he continues to close these wounds down very gradually. Using silver alginate. The problem polymen being definitive about this is areas of what appears to be callus around the margins. This is not a 100% of the area but certainly sizable especially on the right 9/1; bilateral plantar feet wounds secondary to prolonged pressure while being ventilated for COVID-19 in an upright position. Essentially pressure ulcers on the bottom of his feet. He is made substantial progress using silver alginate. 9/14; bilateral plantar feet wounds secondary to prolonged pressure. Making progress using silver alginate. 9/29 bilateral plantar feet wounds secondary to prolonged pressure. I changed him to Iodoflex last week. MolecuLight showing reddened blush fluorescence 10/11; patient presents for follow-up. He has no issues or complaints today. He denies signs of infection. He continues to use Iodoflex and antibiotic ointment to the wound beds. 10/27; 2-week follow-up. No evidence of infection. He has callus and  thick dry skin around the wound margins we have been using Iodoflex and Bactroban which was in response to a moderate left MolecuLight reddish blush fluorescence. 11/10; 2-week follow-up. Wound margins again have thick callus however the measurements of the actual wound sites are a lot smaller. Everything looks reasonably healthy here. We have been using Iodoflex He was approved for prime matrix but I have elected to delay this given the improvement in the surface area. Hopefully I will not regret that decision as were getting close to the end of the year in terms of insurance payment 12/8; 2-week follow-up. Wounds are generally smaller in size. These were initially substantial wounds extending into the forefoot all the way into the heel on the bilateral plantar feet. They  are now both located on the plantar heel distal aspect both of these have a lot of callus around the wounds I used a #5 curette to remove this on the right and the left also some subcutaneous debris to try and get the wound edges were using Iodoflex. He has heel offloading shoe 12/22; 2-week follow-up. Not really much improvement. He has thick callus around the outer edges of both wounds. I remove this there is some nonviable subcutaneous tissue as well. We have been using Iodoflex. Her intake nurse and myself spontaneously thought of a total contact cast I went back in May. At that time we really were not seeing much of an improvement with a cast although the wound was in a much different situation I would like to retry this in 2 weeks and I discussed this with the patient 08/12/2021; the patient has had some improvement with the Iodoflex. The the area on the left heel plantar more improved than the right. I had to put him in a total contact cast on the left although I decided to put that off for 2 weeks. I am going to change his primary dressing to silver collagen. I think in both areas he has had some improvement most of the healing seems to be more proximal in the heel. The wounds are in the mid aspect. A lot of thick callus on the right heel however. 1/19; we are using silver collagen on both plantar heel areas. He has had some improvement today. The left did not require any debridement. He still had some eschar on the right that was debrided but both seem to have contracted. I did not put it total contact cast on him today 2/2 we have been using silver collagen. The area on the right plantar heel has areas that appear to be epithelialized interspersed with dry flaking callus and dry skin. I removed this. This really looks better than on the other side. On the left still a large area with raised edges and debris on the surface. The patient states he is in the heel offloading boots for a prolonged  period of time and really does not use any other footwear 2/6; patient presents for first cast exchange. He has no issues or complaints today. 2/9; not much change in the left foot wound with 1 week of a cast we are using silver collagen. Silver collagen on the right side. The right side has been the better wound surface. We will reapply the total contact cast on the left 2/16; not much improvement on either side I been using silver collagen with a total contact cast on the left. I'm changing the Hydrofera Blue still with a total contact cast on the left 2/23; some improvement  on both sides. Disappointing that he has thick callus around the area that we are putting in a total contact cast on the left. We've been using Hydrofera Blue on both wound areas. This is a man who at essentially pressure ulcers in addition to ischemia caused by medications to support his blood pressure (pressors) in the ICU. He was being ventilated in the standing position for severe Covid. A [place] the wounds extended across his entire foot but are now localized to his plantar heels bilaterally. We have made progress however neither areas healed. I continue to think the total contact cast is helped albeit painstakingly slowly. He has never wanted a plastic surgery consult although I don't know that they would be interested in grafting in area in this location. 10/07/2021: Continued improvement bilaterally. There is still some callus around the left wound, despite the total contact cast. He has some increased pain in his right midfoot around 1 particular area. This has been painful in the past but seems to be a little bit worse. When his cast was removed today, there was an area on the heel of the left foot that looks a bit macerated. He is also complaining of pain in his left thigh and hip which he thinks is secondary to the limb length discrepancy caused by the cast. 10/14/2021: He continues to improve. A little bit less callus  around the left wound. He continues to endorse pain in his right midfoot, but this is not as significant as it was last week. The maceration on his left heel is improved. 10/21/2021: Continued improvement to both wounds. The maceration on his left heel is no longer evident. Less callus bilaterally. Epithelialization progressing. 10/28/2021: Significant improvement this week. The right sided wound is nearly closed with just a small open area at the middle. No maceration seen on the left heel. Continued epithelialization on both sides. No concern for infection. 11/04/2021: T oday, the wounds were measured a little bit differently and come out as larger, but I actually think they are about the same to potentially even smaller, particularly on the left. He continues to accumulate some callus on the right. 11/11/2021: T oday, the patient is expressing some concern that the left wound, despite being in the total contact cast, is not progressing at the same rate as the 1 on the right. He is interested in trying a week without the cast to see how the wound does. The wounds are roughly the same size as last week, with the right perhaps being a little bit smaller. He continues to build up callus on both sites. 11/18/2021: Last week, I permitted the patient to go without his total contact cast, just to see if the cast was really making any difference. Today, both wounds have deteriorated to some extent, suggesting that the cast is providing benefit, at least on the left. Both are larger and have accumulated callus, slough, and other debris. 11/26/2021: I debrided both wounds quite aggressively last week in an effort to stimulate the healing cascade. This appears to have been effective as the left sided wound is a full centimeter shorter in length. Although the right was measured slightly larger, on inspection, it looks as though an area of epithelialized tissue was included in the measurements. We have been using  PolyMem Ag on the wound surfaces with a total contact cast to the left. 12/02/2021: It appears that the intake personnel are including epithelialized tissue in his wound measurements; the right wound is almost completely  epithelialized; there is just a crater at the proximal midfoot with some open areas. On the left, he has built up some callus, but the overall wound surface looks good. There is some senescent skin around the wound margin. He has been in PolyMem Ag bilaterally with a total contact cast on the left. 12/09/2021: The right wound is nearly closed; there is just a small open area at the mid calcaneus. On the left, the wound is smaller with minimal callus buildup. No significant drainage. 12/16/2021: The right calcaneal wound remains minimally open at the mid calcaneus; the rest has epithelialized. On the left, the wound is also a little bit smaller. There is some senescent tissue on the periphery. He is getting his first application of a trial skin substitute called Vendaje today. 12/23/2021: The wound on his right calcaneus is nearly closed; there is just a small area at the most distal aspect of the calcaneus that is open. On the left, the area where we applied to the skin substitute has a healthier-looking bed of granulation tissue. The wound dimensions are not significantly different on this side but the wound surface is improved. 12/30/2021: The wound on the right calcaneus has not changed significantly aside from some accumulation of callus. On the left, the open area is smaller and continues to have an improved surface. He continues to accumulate callus around the wound. He is here for his third application of Vendaje. 01/06/2022: The right calcaneal wound is down to just a couple of millimeters. He continues to accumulate periwound callus. He unfortunately got his cast wet earlier in the week and his left foot is macerated, resulting in some superficial skin loss just distal to the open  wound. The open wound itself, however, is much smaller and has a healthier appearing surface. He is here for his fourth application of Vendaje. Electronic Signature(s) Signed: 01/06/2022 12:21:25 PM By: Fredirick Maudlin MD FACS Entered By: Fredirick Maudlin on 01/06/2022 12:21:25 -------------------------------------------------------------------------------- Physical Exam Details Patient Name: Date of Service: [name], [place] 01/06/2022 11:00 A M Medical Record Number: [id number] Patient Account Number: [id number] Date of Birth/Sex: Treating RN: Nov 11, 1975 (46 y.o. [name] Primary Care Provider: [name] Other Clinician: Referring Provider: Treating Provider/Extender: [name] in Treatment: 29 Constitutional He is hypertensive, but asymptomatic.. . . . No acute distress. Respiratory Normal work of breathing on room air. Notes 01/06/2022: The right calcaneal wound is down to just a couple of millimeters. There is some periwound callus accumulation. On the left, the open area continues to contract and has a healthy, viable surface. There is continued accumulation of callus around the wound. Unfortunately, his cast got wet and the resultant maceration of his foot has resulted in a superficial opening just distal to the larger primary wound. Electronic Signature(s) Signed: 01/06/2022 12:33:18 PM By: Fredirick Maudlin MD FACS Entered By: Fredirick Maudlin on 01/06/2022 12:33:18 -------------------------------------------------------------------------------- Physician Orders Details Patient Name: Date of Service: [name], [place] 01/06/2022 11:00 A M Medical Record Number: [id number] Patient Account Number: [id number] Date of Birth/Sex: Treating RN: 09-27-1975 (46 y.o. [name] Primary Care Provider: [name] Other Clinician: Referring Provider: Treating Provider/Extender: [name] in Treatment: 20 Verbal /  Phone Orders: No Diagnosis Coding ICD-10 Coding Code Description L97.512 Non-pressure chronic ulcer of other part of right foot with fat layer exposed L97.522 Non-pressure chronic ulcer of other part of left foot with fat layer exposed Follow-up Appointments ppointment in 1 week. -  Dr [name] - Room 3 Return A Cellular or Tissue Based Products Cellular or Tissue Based Product Type: - Vendaje #4 (trial) Cellular or Tissue Based Product applied to wound bed, secured with steri-strips, cover with Adaptic or Mepitel. (DO NOT REMOVE). Bathing/ Shower/ Hygiene May shower with protection but do not get wound dressing(s) wet. - Cover with cast protector (can purchase cast protector at [facility] or [facility] ) Edema Control - Lymphedema / SCD / Other Bilateral Lower Extremities Elevate legs to the level of the heart or above for 30 minutes daily and/or when sitting, a frequency of: - throughout the day Avoid standing for long periods of time. Moisturize legs daily. - right leg and foot every night. Off-Loading Total Contact Cast to Left Lower Extremity Wedge shoe to: - Globoped Shoe to right foot and left foot when not in TCC Other: - keep pressure off of the bottom of your feet Additional Orders / Instructions Follow Nutritious Diet Wound Treatment Wound #1 - Calcaneus Wound Laterality: Right Cleanser: Normal Saline (Generic) Every Other Day/30 Days Discharge Instructions: Cleanse the wound with Normal Saline prior to applying a clean dressing using gauze sponges, not tissue or cotton balls. Cleanser: Wound Cleanser (Generic) Every Other Day/30 Days Discharge Instructions: Cleanse the wound with wound cleanser prior to applying a clean dressing using gauze sponges, not tissue or cotton balls. Prim Dressing: PolyMem Silver Non-Adhesive Dressing, 4.25x4.25 in (Generic) Every Other Day/30 Days ary Discharge Instructions: Apply to wound bed as instructed Secondary Dressing: Zetuvit Plus 4x8 in  (Generic) Every Other Day/30 Days Discharge Instructions: Apply over primary dressing as directed. Secured With: Elastic Bandage 4 inch (ACE bandage) (Generic) Every Other Day/30 Days Discharge Instructions: Secure with ACE bandage as directed. Secured With: [employer], 4.5x3.1 (in/yd) (Generic) Every Other Day/30 Days Discharge Instructions: Secure with Kerlix as directed. Wound #2 - Calcaneus Wound Laterality: Left Cleanser: Normal Saline (Generic) 1 x Per Week/30 Days Discharge Instructions: Cleanse the wound with Normal Saline prior to applying a clean dressing using gauze sponges, not tissue or cotton balls. Cleanser: Wound Cleanser 1 x Per Week/30 Days Discharge Instructions: Cleanse the wound with wound cleanser prior to applying a clean dressing using gauze sponges, not tissue or cotton balls. Peri-Wound Care: Zinc Oxide Ointment 30g tube 1 x Per Week/30 Days Discharge Instructions: Apply Zinc Oxide to periwound with each dressing change Prim Dressing: Vendaje ary 1 x Per Week/30 Days Secondary Dressing: Zetuvit Plus 4x8 in (Generic) 1 x Per Week/30 Days Discharge Instructions: Apply over primary dressing as directed. Secured With: [employer], 4.5x3.1 (in/yd) (Generic) 1 x Per Week/30 Days Discharge Instructions: Secure with Kerlix as directed. Secured With: 65M Medipore H Soft Cloth Surgical T ape, 4 x 10 (in/yd) (Generic) 1 x Per Week/30 Days Discharge Instructions: Secure with tape as directed. Wound #4 - Foot Wound Laterality: Plantar, Left Cleanser: Soap and Water 1 x Per Week/30 Days Discharge Instructions: May shower and wash wound with dial antibacterial soap and water prior to dressing change. Cleanser: Wound Cleanser 1 x Per Week/30 Days Discharge Instructions: Cleanse the wound with wound cleanser prior to applying a clean dressing using gauze sponges, not tissue or cotton balls. Prim Dressing: KerraCel Ag Gelling Fiber Dressing, 2x2 in (silver alginate)  1 x Per Week/30 Days ary Discharge Instructions: Apply silver alginate to wound bed as instructed Secured With: Kerlix Roll Sterile, 4.5x3.1 (in/yd) 1 x Per Week/30 Days Discharge Instructions: Secure with Kerlix as directed. Secured With: Paper Tape, 2x10 (in/yd) 1  x Per Week/30 Days Discharge Instructions: Secure dressing with tape as directed. Electronic Signature(s) Signed: 01/06/2022 4:49:27 PM By: Fredirick Maudlin MD FACS Signed: 01/06/2022 6:53:41 PM By: [name] Entered By: [name] on 01/06/2022 12:42:23 -------------------------------------------------------------------------------- Problem List Details Patient Name: Date of Service: [name], [place] 01/06/2022 11:00 A M Medical Record Number: [id number] Patient Account Number: [id number] Date of Birth/Sex: Treating RN: March 04, 1976 (46 y.o. [name] Primary Care Provider: [name] Other Clinician: Referring Provider: Treating Provider/Extender: [name] in Treatment: 55 Active Problems ICD-10 Encounter Code Description Active Date MDM Diagnosis L97.512 Non-pressure chronic ulcer of other part of right foot with fat layer exposed 09/03/2020 No Yes L97.522 Non-pressure chronic ulcer of other part of left foot with fat layer exposed 09/03/2020 No Yes Inactive Problems ICD-10 Code Description Active Date Inactive Date L89.893 Pressure ulcer of other site, stage 3 07/15/2020 07/15/2020 M62.81 Muscle weakness (generalized) 07/15/2020 07/15/2020 I10 Essential (primary) hypertension 07/15/2020 07/15/2020 M86.171 Other acute osteomyelitis, right ankle and foot 09/03/2020 09/03/2020 Resolved Problems Electronic Signature(s) Signed: 01/06/2022 12:14:58 PM By: Fredirick Maudlin MD FACS Entered By: Fredirick Maudlin on 01/06/2022 12:14:57 -------------------------------------------------------------------------------- Progress Note Details Patient Name: Date of Service: [name], [place]. 01/06/2022 11:00 A M Medical Record Number: [id number] Patient Account Number: [id number] Date of Birth/Sex: Treating RN: Dec 24, 1975 (46 y.o. [name] Primary Care Provider: [name] Other Clinician: Referring Provider: Treating Provider/Extender: [name] in Treatment: 37 Subjective Chief Complaint Information obtained from Patient Bilateral Plantar Foot Ulcers History of Present Illness (HPI) Wounds are12/03/2020 upon evaluation today patient presents for initial inspection here in our clinic concerning issues he has been having with the bottoms of his feet bilaterally. He states these actually occurred as wounds when he was hospitalized for 5 months secondary to Covid. He was apparently with tilting bed where he was in an upright position quite frequently and apparently this occurred in some way shape or form during that time. Fortunately there is no sign of active infection at this time. No fevers, chills, nausea, vomiting, or diarrhea. With that being said he still has substantial wounds on the plantar aspects of his feet Theragen require quite a bit of work to get these to heal. He has been using Santyl currently though that is been problematic both in receiving the medication as well as actually paid for it as it is become quite expensive. Prior to the experience with Covid the patient really did not have any major medical problems other than hypertension he does have some mild generalized weakness following the Covid experience. 07/22/2020 on evaluation today patient appears to be doing okay in regard to his foot ulcers I feel like the wound beds are showing signs of better improvement that I do believe the Iodoflex is helping in this regard. With that being said he does have a lot of drainage currently and this is somewhat blue/green in nature which is consistent with Pseudomonas. I do think a culture today would be appropriate for [place] to evaluate  and see if that is indeed the case I would likely start him on antibiotic orally as well he is not allergic to Cipro knows of no issues he has had in the past 12/21; patient was admitted to the clinic earlier this month with bilateral presumed pressure ulcers on the bottom of his feet apparently related to excessive pressure from a tilt table arrangement in the intensive care unit. Patient relates this to  being on ECMO but I am not really sure that is exactly related to that. I must say I have never seen anything like this. He has fairly extensive full-thickness wounds extending from his heel towards his midfoot mostly centered laterally. There is already been some healing distally. He does not appear to have an arterial issue. He has been using gentamicin to the wound surfaces with Iodoflex to help with ongoing debridement 1/6; this is a patient with pressure ulcers on the bottom of his feet related to excessive pressure from a standing position in the intensive care unit. He is complaining of a lot of pain in the right heel. He is not a diabetic. 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 We have been using Iodoflex to help prepare the surfaces of both wounds for an advanced treatment product. He is nonambulatory spending most of his time in a wheelchair I have asked him not to propel the wheelchair with his heels 1/13; in general his wounds look better not much surface area change we have been using Iodoflex as of last week. I did an x-ray of the right heel as the patient was complaining of pain. I had some thoughts about a stress fracture perhaps Achilles tendon problems however what it showed was erosive changes along the inferior aspect of the calcaneus he now has a MRI booked for 1/20. 1/20; in general his wounds continue to be better. Some improvement in the large narrow areas proximally in his foot. He is still complaining of pain in the right heel and tenderness in  certain areas of this wound. His MRI is tonight. I am not just looking for osteomyelitis that was brought up on the x-ray I am wondering about stress fractures, tendon ruptures etc. He has no such findings on the left. Also noteworthy is that the patient had critical illness neuropathy and some of the discomfort may be actual improvement in nerve function I am just not sure. These wounds were initially in the setting of severe critical illness related to COVID-19. He was put in a standing position. He may have also been on pressors at the point contributing to tissue ischemia. By his description at some point these wounds were grossly necrotic extending proximally up into the Achilles part of his heel. I do not know that I have ever really seen pictures of them like this although they may exist in epic We have ordered Tri layer Oasis. I am trying to stimulate some granulation in these areas. This is of course assuming the MRI is negative for infection 1/27; since the patient was last here he saw Dr. Juleen China of infectious disease. He is planned for vancomycin and ceftriaxone. Prior operative culture grew MSSA. Also ordered baseline lab work. He also ordered arterial studies although the ABIs in our clinic were normal as well as his clinical exam these were normal I do not think he needs to see vascular surgery. His ABIs at the [facility] were 1.22 in the right triphasic waveforms with a normal TBI of 1.15 on the left ABI of 1.22 with triphasic waveforms and a normal TBI of 1.08. Finally he saw Dr. Amalia Hailey who will follow him in for 2 months. At this point I do not think he felt that he needed a procedure on the right calcaneal bone. Dr. Juleen China is elected for broad-spectrum antibiotic The patient is still having pain in the right heel. He walks with a walker 2/3; wounds are generally smaller. He is  tolerating his IV antibiotics. I believe this is vancomycin and ceftriaxone. We are still waiting for Oasis burn  in terms of his out-of-pocket max which he should be meeting soon given the IV antibiotics, MRIs etc. I have asked him to check in on this. We are using silver collagen in the meantime the wounds look better 2/10; tolerating IV vancomycin and Rocephin. We are waiting to apply for Oasis. Although I am not really sure where he is in his out-of-pocket max. 2/17 started the first application of Oasis trilayer. Still on antibiotics. The wounds have generally look better. The area on the left has a little more surface slough requiring debridement 9/81; second application of Oasis trilayer. The wound surface granulation is generally look better. The area on the left with undermining laterally I think is come in a bit. 10/08/2020 upon evaluation today patient is here today for [employer] application #3. Fortunately he seems to be doing extremely well with regard to this and we are seeing a lot of new epithelial growth which is great news. Fortunately there is no signs of active infection at this time. 10/16/2020 upon evaluation today patient appears to be doing well with regard to his foot ulcers. Do believe the Oasis has been of benefit for him. I do not see any signs of infection right now which is great news and I think that he has a lot of new epithelial growth which is great to see as well. The patient is very pleased to hear all of this. I do think we can proceed with the Oasis trilayer #4 today. 3/18; not as much improvement in these areas on his heels that I was hoping. I did reapply trilateral Oasis today the tissue looks healthier but not as much fill in as I was hoping. 3/25; better looking today I think this is come in a bit the tissue looks healthier. Triple layer Oasis reapplied #6 4/1; somewhat better looking definitely better looking surface not as much change in surface area as I was hoping. He may be spending more time [place] on days then he needs to although he does have heel offloading  boots. Triple layer Oasis reapplied #7 4/7; unfortunately apparently [facility] will not approve any further Oasis which is unfortunate since the patient did respond nicely both in terms of the condition of the wound bed as well as surface area. There is still some drainage coming from the wound but not a lot there does not appear to be any infection 4/15; we have been using Hydrofera Blue. He continues to have nice rims of epithelialization on the right greater than the left. The left the epithelialization is coming from the tip of his heel. There is moderate drainage. In this that concerns me about a total contact cast. There is no evidence of infection 4/29; patient has been using Hydrofera Blue with dressing changes. He has no complaints or issues today. 5/5; using Hydrofera Blue. I actually think that he looks marginally better than the last time I saw this 3 weeks ago. There are rims of epithelialization on the left thumb coming from the medial side on the right. Using Hydrofera Blue 5/12; using Hydrofera Blue. These continue to make improvements in surface area. His drainage was not listed as severe I therefore went ahead and put a cast on the left foot. Right foot we will continue to dress his previous 5/16; back for first total contact cast change. He did not tolerate this particularly  well cast injury on the anterior tibia among other issues. Difficulty sleeping. I talked him about this in some detail and afterwards is elected to continue. I told him I would like to have a cast on for 3 weeks to see if this is going to help at all. I think he agreed 5/19; I think the wound is better. There is no tunneling towards his midfoot. The undermining medially also looks better. He has a rim of new skin distally. I think we are making progress here. The area on the left also continues to look somewhat better to me using Hydrofera Blue. He has a list of complaints about the cast but none  of them seem serious 5/26; patient presents for 1 week follow-up. He has been using a total contact cast and tolerating this well. Hydrofera Blue is the main dressing used. He denies signs of infection. 6/2 Hydrofera Blue total contact cast on the left. These were large ulcers that formed in intensive care unit where the patient was recovering from [place]. May have had something to do with being ventilated in an upright positiono Pressors etc. We have been able to get the areas down considerably and a viable surface. There is some epithelialization in both sides. Note made of drainage 6/9; changed to [facility] last time because of drainage. He arrives with better looking surfaces and dimensions on the left than the right. Paradoxically the right actually probes more towards his midfoot the left is largely close down but both of these look improved. Using a total contact cast on the left 6/16; complex wounds on his bilateral plantar heels which were initially pressure injury from a stay in the ICU with COVID. We have been using silver alginate most recently. His dimensions of come in quite dramatically however not recently. We have been putting the left foot in a total contact cast 6/23; complex wounds on the bilateral plantar heels. I been putting the left in the cast paradoxically the area on the right is the one that is going towards closure at a faster rate. Quite a bit of drainage on the left. The patient went to see Dr. Amalia Hailey who said he was going to standby for skin grafts. I had actually considered sending him for skin grafts however he would be mandatorily off his feet for a period of weeks to months. I am thinking that the area on the right is going to close on its own the area on the left has been more stubborn even though we have him in a total contact cast 6/30; took him out of a total contact cast last week is the right heel seem to be making better progress than the left where I was  placing the cast. We are using silver alginate. Both wounds are smaller right greater than left 7/12; both wounds look as though they are making some progress. We are using silver alginate. Heel offloading boots 7/26; very gradual progress especially on the right. Using silver alginate. He is wearing heel offloading boots 8/18; he continues to close these wounds down very gradually. Using silver alginate. The problem polymen being definitive about this is areas of what appears to be callus around the margins. This is not a 100% of the area but certainly sizable especially on the right 9/1; bilateral plantar feet wounds secondary to prolonged pressure while being ventilated for COVID-19 in an upright position. Essentially pressure ulcers on the bottom of his feet. He is made substantial progress using silver  alginate. 9/14; bilateral plantar feet wounds secondary to prolonged pressure. Making progress using silver alginate. 9/29 bilateral plantar feet wounds secondary to prolonged pressure. I changed him to Iodoflex last week. MolecuLight showing reddened blush fluorescence 10/11; patient presents for follow-up. He has no issues or complaints today. He denies signs of infection. He continues to use Iodoflex and antibiotic ointment to the wound beds. 10/27; 2-week follow-up. No evidence of infection. He has callus and thick dry skin around the wound margins we have been using Iodoflex and Bactroban which was in response to a moderate left MolecuLight reddish blush fluorescence. 11/10; 2-week follow-up. Wound margins again have thick callus however the measurements of the actual wound sites are a lot smaller. Everything looks reasonably healthy here. We have been using Iodoflex He was approved for prime matrix but I have elected to delay this given the improvement in the surface area. Hopefully I will not regret that decision as were getting close to the end of the year in terms of insurance  payment 12/8; 2-week follow-up. Wounds are generally smaller in size. These were initially substantial wounds extending into the forefoot all the way into the heel on the bilateral plantar feet. They are now both located on the plantar heel distal aspect both of these have a lot of callus around the wounds I used a #5 curette to remove this on the right and the left also some subcutaneous debris to try and get the wound edges were using Iodoflex. He has heel offloading shoe 12/22; 2-week follow-up. Not really much improvement. He has thick callus around the outer edges of both wounds. I remove this there is some nonviable subcutaneous tissue as well. We have been using Iodoflex. Her intake nurse and myself spontaneously thought of a total contact cast I went back in May. At that time we really were not seeing much of an improvement with a cast although the wound was in a much different situation I would like to retry this in 2 weeks and I discussed this with the patient 08/12/2021; the patient has had some improvement with the Iodoflex. The the area on the left heel plantar more improved than the right. I had to put him in a total contact cast on the left although I decided to put that off for 2 weeks. I am going to change his primary dressing to silver collagen. I think in both areas he has had some improvement most of the healing seems to be more proximal in the heel. The wounds are in the mid aspect. A lot of thick callus on the right heel however. 1/19; we are using silver collagen on both plantar heel areas. He has had some improvement today. The left did not require any debridement. He still had some eschar on the right that was debrided but both seem to have contracted. I did not put it total contact cast on him today 2/2 we have been using silver collagen. The area on the right plantar heel has areas that appear to be epithelialized interspersed with dry flaking callus and dry skin. I removed  this. This really looks better than on the other side. On the left still a large area with raised edges and debris on the surface. 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 He has no issues or complaints today. 2/9; not much change in the  left foot wound with 1 week of a cast we are using silver collagen. Silver collagen on the right side. The right side has been the better wound surface. We will reapply the total contact cast on the left 2/16; not much improvement on either side I been using silver collagen with a total contact cast on the left. I'm changing the Hydrofera Blue still with a total contact cast on the left 2/23; some improvement on both sides. Disappointing that he has thick callus around the area that we are putting in a total contact cast on the left. We've been using Hydrofera Blue on both wound areas. This is a man who at essentially pressure ulcers in addition to ischemia caused by medications to support his blood pressure (pressors) in the ICU. He was being ventilated in the standing position for severe Covid. A Shiley the wounds extended across his entire foot but are now localized to his plantar heels bilaterally. We have made progress however neither areas healed. I continue to think the total contact cast is helped albeit painstakingly slowly. He has never wanted a plastic surgery consult although I don't know that they would be interested in grafting in area in this location. 10/07/2021: Continued improvement bilaterally. There is still some callus around the left wound, despite the total contact cast. He has some increased pain in his right midfoot around 1 particular area. This has been painful in the past but seems to be a little bit worse. When his cast was removed today, there was an area on the heel of the left foot that looks a bit  macerated. He is also complaining of pain in his left thigh and hip which he thinks is secondary to the limb length discrepancy caused by the cast. 10/14/2021: He continues to improve. A little bit less callus around the left wound. He continues to endorse pain in his right midfoot, but this is not as significant as it was last week. The maceration on his left heel is improved. 10/21/2021: Continued improvement to both wounds. The maceration on his left heel is no longer evident. Less callus bilaterally. Epithelialization progressing. 10/28/2021: Significant improvement this week. The right sided wound is nearly closed with just a small open area at the middle. No maceration seen on the left heel. Continued epithelialization on both sides. No concern for infection. 11/04/2021: T oday, the wounds were measured a little bit differently and come out as larger, but I actually think they are about the same to potentially even smaller, particularly on the left. He continues to accumulate some callus on the right. 11/11/2021: T oday, the patient is expressing some concern that the left wound, despite being in the total contact cast, is not progressing at the same rate as the 1 on the right. He is interested in trying a week without the cast to see how the wound does. The wounds are roughly the same size as last week, with the right perhaps being a little bit smaller. He continues to build up callus on both sites. 11/18/2021: Last week, I permitted the patient to go without his total contact cast, just to see if the cast was really making any difference. Today, both wounds have deteriorated to some extent, suggesting that the cast is providing benefit, at least on the left. Both are larger and have accumulated callus, slough, and other debris. 11/26/2021: I debrided both wounds quite aggressively last week in an effort to stimulate the healing cascade. This appears to have been  effective as the left sided wound is a  full centimeter shorter in length. Although the right was measured slightly larger, on inspection, it looks as though an area of epithelialized tissue was included in the measurements. We have been using PolyMem Ag on the wound surfaces with a total contact cast to the left. 12/02/2021: It appears that the intake personnel are including epithelialized tissue in his wound measurements; the right wound is almost completely epithelialized; there is just a crater at the proximal midfoot with some open areas. On the left, he has built up some callus, but the overall wound surface looks good. There is some senescent skin around the wound margin. He has been in PolyMem Ag bilaterally with a total contact cast on the left. 12/09/2021: The right wound is nearly closed; there is just a small open area at the mid calcaneus. On the left, the wound is smaller with minimal callus buildup. No significant drainage. 12/16/2021: The right calcaneal wound remains minimally open at the mid calcaneus; the rest has epithelialized. On the left, the wound is also a little bit smaller. There is some senescent tissue on the periphery. He is getting his first application of a trial skin substitute called Vendaje today. 12/23/2021: The wound on his right calcaneus is nearly closed; there is just a small area at the most distal aspect of the calcaneus that is open. On the left, the area where we applied to the skin substitute has a healthier-looking bed of granulation tissue. The wound dimensions are not significantly different on this side but the wound surface is improved. 12/30/2021: The wound on the right calcaneus has not changed significantly aside from some accumulation of callus. On the left, the open area is smaller and continues to have an improved surface. He continues to accumulate callus around the wound. He is here for his third application of Vendaje. 01/06/2022: The right calcaneal wound is down to just a couple of  millimeters. He continues to accumulate periwound callus. He unfortunately got his cast wet earlier in the week and his left foot is macerated, resulting in some superficial skin loss just distal to the open wound. The open wound itself, however, is much smaller and has a healthier appearing surface. He is here for his fourth application of Vendaje. Patient History Information obtained from Patient. Family History Cancer - Maternal Grandparents, Diabetes - Father,Paternal Grandparents, Heart Disease - Maternal Grandparents, Hypertension - Father,Paternal Grandparents, Lung Disease - Siblings, No family history of Hereditary Spherocytosis, Kidney Disease, Seizures, Stroke, Thyroid Problems, Tuberculosis. Social History Never smoker, Marital Status - Married, Alcohol Use - Never, Drug Use - No History, Caffeine Use - Daily - tea, soda. Medical History Eyes Denies history of Cataracts, Glaucoma, Optic Neuritis Ear/Nose/Mouth/Throat Denies history of Chronic sinus problems/congestion, Middle ear problems Hematologic/Lymphatic Denies history of Anemia, Hemophilia, Human Immunodeficiency Virus, Lymphedema, Sickle Cell Disease Respiratory Patient has history of Asthma Denies history of Aspiration, Chronic Obstructive Pulmonary Disease (COPD), Pneumothorax, Sleep Apnea, Tuberculosis Cardiovascular Patient has history of Angina - with COVID, Hypertension Denies history of Arrhythmia, Congestive Heart Failure, Coronary Artery Disease, Deep Vein Thrombosis, Hypotension, Myocardial Infarction, Peripheral Arterial Disease, Peripheral Venous Disease, Phlebitis, Vasculitis Gastrointestinal Denies history of Cirrhosis , Colitis, Crohnoos, Hepatitis A, Hepatitis B, Hepatitis C Endocrine Denies history of Type I Diabetes, Type II Diabetes Genitourinary Denies history of End Stage Renal Disease Immunological Denies history of Lupus Erythematosus, Raynaudoos, Scleroderma Integumentary (Skin) Denies  history of History of Burn Musculoskeletal Denies history of Gout, Rheumatoid  Arthritis, Osteoarthritis, Osteomyelitis Neurologic Denies history of Dementia, Neuropathy, Quadriplegia, Paraplegia, Seizure Disorder Oncologic Denies history of Received Chemotherapy, Received Radiation Psychiatric Denies history of Anorexia/bulimia, Confinement Anxiety Hospitalization/Surgery History - COVID PNA 07/22/2019- 11/14/2019. - 03/27/2020 wound debridement/ skin graft. Medical A Surgical History Notes nd Constitutional Symptoms (General Health) COVID PNA 07/22/2019-11/14/2019 [facility] ECMO, foot drop left foot , Genitourinary kidney stone Psychiatric anxiety Objective Constitutional He is hypertensive, but asymptomatic.Marland Kitchen No acute distress. Vitals Time Taken: 11:04 AM, Height: 69 in, Weight: 280 lbs, BMI: 41.3, Temperature: 98.2 F, Pulse: 87 bpm, Respiratory Rate: 18 breaths/min, Blood Pressure: 162/99 mmHg. Respiratory Normal work of breathing on room air. General Notes: 01/06/2022: The right calcaneal wound is down to just a couple of millimeters. There is some periwound callus accumulation. On the left, the open area continues to contract and has a healthy, viable surface. There is continued accumulation of callus around the wound. Unfortunately, his cast got wet and the resultant maceration of his foot has resulted in a superficial opening just distal to the larger primary wound. Integumentary (Hair, Skin) Wound #1 status is Open. Original cause of wound was Pressure Injury. The date acquired was: 10/07/2019. The wound has been in treatment 77 weeks. The wound is located on the Right Calcaneus. The wound measures 0.3cm length x 0.2cm width x 0.2cm depth; 0.047cm^2 area and 0.009cm^3 volume. There is Fat Layer (Subcutaneous Tissue) exposed. There is no tunneling or undermining noted. There is a medium amount of serosanguineous drainage noted. The wound margin is distinct with the outline attached to  the wound base. There is large (67-100%) red, pink granulation within the wound bed. There is a small (1- 33%) amount of necrotic tissue within the wound bed including Eschar and Adherent Slough. Wound #2 status is Open. Original cause of wound was Pressure Injury. The date acquired was: 10/07/2019. The wound has been in treatment 77 weeks. The wound is located on the Left Calcaneus. The wound measures 0.9cm length x 0.7cm width x 0.2cm depth; 0.495cm^2 area and 0.099cm^3 volume. There is Fat Layer (Subcutaneous Tissue) exposed. There is no tunneling or undermining noted. There is a medium amount of serosanguineous drainage noted. The wound margin is distinct with the outline attached to the wound base. There is large (67-100%) red, pink granulation within the wound bed. There is a small (1-33%) amount of necrotic tissue within the wound bed including Adherent Slough. General Notes: periwound has macerated skin Wound #4 status is Open. Original cause of wound was Other Lesion. The date acquired was: 12/10/2021. The wound is located on the [place]. The wound measures 0.3cm length x 0.3cm width x 0.1cm depth; 0.071cm^2 area and 0.007cm^3 volume. There is Fat Layer (Subcutaneous Tissue) exposed. There is no tunneling or undermining noted. There is a medium amount of serosanguineous drainage noted. There is large (67-100%) pink granulation within the wound bed. There is no necrotic tissue within the wound bed. Assessment Active Problems ICD-10 Non-pressure chronic ulcer of other part of right foot with fat layer exposed Non-pressure chronic ulcer of other part of left foot with fat layer exposed Procedures Wound #1 Pre-procedure diagnosis of Wound #1 is a Pressure Ulcer located on the Right Calcaneus . There was a Selective/Open Wound Non-Viable Tissue Debridement with a total area of 0.06 sq cm performed by Fredirick Maudlin, MD. With the following instrument(s): Curette to remove Non-Viable  tissue/material. Material removed includes Callus after achieving pain control using Lidocaine 5% topical ointment. No specimens were taken. A time out  was conducted at 11:40, prior to the start of the procedure. A Minimum amount of bleeding was controlled with Pressure. The procedure was tolerated well with a pain level of 0 throughout and a pain level of 0 following the procedure. Post Debridement Measurements: 0.3cm length x 0.2cm width x 0.2cm depth; 0.009cm^3 volume. Post debridement Stage noted as Category/Stage III. Character of Wound/Ulcer Post Debridement is improved. Post procedure Diagnosis Wound #1: Same as Pre-Procedure Plan The following medication(s) was prescribed: triamcinolone acetonide topical 0.1 % cream Apply to skin around wound on right foot with dressing changes. Do not apply directly to wound. starting 01/06/2022 01/06/2022: The right calcaneal wound is down to just a couple of millimeters. There is some periwound callus accumulation. On the left, the open area continues to contract and has a healthy, viable surface. There is continued accumulation of callus around the wound. Unfortunately, his cast got wet and the resultant maceration of his foot has resulted in a superficial opening just distal to the larger primary wound. I debrided the periwound callus from both heels. I applied the trial skin substitute (no charge); this is his fourth application. This was secured with Adaptic and Steri-Strips. We will apply some silver alginate to the newly opened area just distal to this wound. As he was complaining of itching on the noncasted foot, I prescribed some triamcinolone cream for him to apply when he does his dressing changes. T contact cast applied on the left. Follow-up in 1 week. otal Electronic Signature(s) Signed: 01/06/2022 12:37:07 PM By: Fredirick Maudlin MD FACS Signed: 01/06/2022 12:37:07 PM By: Fredirick Maudlin MD FACS Previous Signature: 01/06/2022 12:35:42 PM Version  By: Fredirick Maudlin MD FACS Entered By: Fredirick Maudlin on 01/06/2022 12:37:06 -------------------------------------------------------------------------------- HxROS Details Patient Name: Date of Service: [name], [place] 01/06/2022 11:00 A M Medical Record Number: [id number] Patient Account Number: [id number] Date of Birth/Sex: Treating RN: 1976/07/07 (46 y.o. [name] Primary Care Provider: [name] Other Clinician: Referring Provider: Treating Provider/Extender: [name] in Treatment: 25 Information Obtained From Patient Constitutional Symptoms (General Health) Medical History: Past Medical History Notes: COVID PNA 07/22/2019-11/14/2019 [facility] ECMO, foot drop left foot , Eyes Medical History: Negative for: Cataracts; Glaucoma; Optic Neuritis Ear/Nose/Mouth/Throat Medical History: Negative for: Chronic sinus problems/congestion; Middle ear problems Hematologic/Lymphatic Medical History: Negative for: Anemia; Hemophilia; Human Immunodeficiency Virus; Lymphedema; Sickle Cell Disease Respiratory Medical History: Positive for: Asthma Negative for: Aspiration; Chronic Obstructive Pulmonary Disease (COPD); Pneumothorax; Sleep Apnea; Tuberculosis Cardiovascular Medical History: Positive for: Angina - with COVID; Hypertension Negative for: Arrhythmia; Congestive Heart Failure; Coronary Artery Disease; Deep Vein Thrombosis; Hypotension; Myocardial Infarction; Peripheral Arterial Disease; Peripheral Venous Disease; Phlebitis; Vasculitis Gastrointestinal Medical History: Negative for: Cirrhosis ; Colitis; Crohns; Hepatitis A; Hepatitis B; Hepatitis C Endocrine Medical History: Negative for: Type I Diabetes; Type II Diabetes Genitourinary Medical History: Negative for: End Stage Renal Disease Past Medical History Notes: kidney stone Immunological Medical History: Negative for: Lupus Erythematosus; Raynauds; Scleroderma Integumentary  (Skin) Medical History: Negative for: History of Burn Musculoskeletal Medical History: Negative for: Gout; Rheumatoid Arthritis; Osteoarthritis; Osteomyelitis Neurologic Medical History: Negative for: Dementia; Neuropathy; Quadriplegia; Paraplegia; Seizure Disorder Oncologic Medical History: Negative for: Received Chemotherapy; Received Radiation Psychiatric Medical History: Negative for: Anorexia/bulimia; Confinement Anxiety Past Medical History Notes: anxiety Immunizations Pneumococcal Vaccine: Received Pneumococcal Vaccination: No Implantable Devices None Hospitalization / Surgery History Type of Hospitalization/Surgery COVID PNA 07/22/2019- 11/14/2019 03/27/2020 wound debridement/ skin graft Family and Social History Cancer: Yes - Maternal Grandparents; Diabetes: Yes -  Father,Paternal Grandparents; Heart Disease: Yes - Maternal Grandparents; Hereditary Spherocytosis: No; Hypertension: Yes - Father,Paternal Grandparents; Kidney Disease: No; Lung Disease: Yes - Siblings; Seizures: No; Stroke: No; Thyroid Problems: No; Tuberculosis: No; Never smoker; Marital Status - Married; Alcohol Use: Never; Drug Use: No History; Caffeine Use: Daily - tea, soda; Financial Concerns: No; Food, Clothing or Shelter Needs: No; Support System Lacking: No; Transportation Concerns: No Engineer, maintenance) Signed: 01/06/2022 12:38:17 PM By: Fredirick Maudlin MD FACS Signed: 01/06/2022 6:53:41 PM By: [name] Entered By: Fredirick Maudlin on 01/06/2022 12:31:37 -------------------------------------------------------------------------------- Total Contact Cast Details Patient Name: Date of Service: [name], [place] 01/06/2022 11:00 A M Medical Record Number: [id number] Patient Account Number: [id number] Date of Birth/Sex: Treating RN: 1976/01/04 (46 y.o. [name] Primary Care Provider: [name] Other Clinician: Referring Provider: Treating Provider/Extender: [name] in Treatment: 38 T Contact Cast Applied for Wound Assessment: otal Wound #2 Left Calcaneus Performed By: Physician Fredirick Maudlin, MD Post Procedure Diagnosis Same as Pre-procedure Electronic Signature(s) Signed: 01/06/2022 12:38:17 PM By: Fredirick Maudlin MD FACS Signed: 01/06/2022 6:53:41 PM By: [name] Entered By: [name] on 01/06/2022 12:37:28 -------------------------------------------------------------------------------- Total Contact Cast Details Patient Name: Date of Service: [name], [place] 01/06/2022 11:00 A M Medical Record Number: [id number] Patient Account Number: [id number] Date of Birth/Sex: Treating RN: [birth year] (46 y.o. [name] Primary Care Provider: [name] Other Clinician: Referring Provider: Treating Provider/Extender: [name] in Treatment: 53 [place] for Wound Assessment: otal Wound #4 Left,Plantar Foot Performed By: Physician Fredirick Maudlin, MD Post Procedure Diagnosis Same as Pre-procedure Electronic Signature(s) Signed: 01/06/2022 4:49:27 PM By: Fredirick Maudlin MD FACS Signed: 01/06/2022 6:53:41 PM By: [name] Entered By: [name] on 01/06/2022 12:39:02 -------------------------------------------------------------------------------- [facility] Details Patient Name: Date of Service: [name], [place]. 01/06/2022 Medical Record Number: [id number] Patient Account Number: [id number] Date of Birth/Sex: Treating RN: 1975-11-01 (46 y.o. [name] Primary Care Provider: [name] Other Clinician: Referring Provider: Treating Provider/Extender: [name] in Treatment: 77 Diagnosis Coding ICD-10 Codes Code Description [phone] Non-pressure chronic ulcer of other part of right foot with fat layer exposed L97.522 Non-pressure chronic ulcer of other part of left foot with fat layer exposed Facility  Procedures CPT4 Code: [id number] Description: [phone] - DEBRIDE WOUND 1ST 20 SQ CM OR < ICD-10 Diagnosis Description L97.512 Non-pressure chronic ulcer of other part of right foot with fat layer exposed L97.522 Non-pressure chronic ulcer of other part of left foot with fat layer exposed Modifier: Quantity: 1 Physician Procedures : CPT4 Code Description Modifier [id number] - WC PHYS LEVEL 4 - EST PT 25 ICD-10 Diagnosis Description L97.512 Non-pressure chronic ulcer of other part of right foot with fat layer exposed L97.522 Non-pressure chronic ulcer of other part of left foot  with fat layer exposed Quantity: 1 : [id number] - WC PHYS DEBR WO ANESTH 20 SQ CM ICD-10 Diagnosis Description L97.512 Non-pressure chronic ulcer of other part of right foot with fat layer exposed L97.522 Non-pressure chronic ulcer of other part of left foot with fat layer exposed Quantity: 1 Electronic Signature(s) Signed: 01/06/2022 12:37:25 PM By: Fredirick Maudlin MD FACS Entered By: Fredirick Maudlin on 01/06/2022 12:37:25

## 2022-01-11 ENCOUNTER — Encounter: Payer: Self-pay | Admitting: Neurology

## 2022-01-11 ENCOUNTER — Ambulatory Visit: Payer: PPO | Admitting: Neurology

## 2022-01-11 VITALS — BP 146/90 | HR 74

## 2022-01-11 DIAGNOSIS — G7281 Critical illness myopathy: Secondary | ICD-10-CM | POA: Diagnosis not present

## 2022-01-11 DIAGNOSIS — G6281 Critical illness polyneuropathy: Secondary | ICD-10-CM | POA: Diagnosis not present

## 2022-01-11 DIAGNOSIS — G4733 Obstructive sleep apnea (adult) (pediatric): Secondary | ICD-10-CM | POA: Insufficient documentation

## 2022-01-11 HISTORY — DX: Obstructive sleep apnea (adult) (pediatric): G47.33

## 2022-01-11 NOTE — Progress Notes (Signed)
Chief Complaint  Patient presents with   Follow-up    Room 13, alone Reports no changes, pt would would to discuss numbness in both arms,       ASSESSMENT AND PLAN  [name] is a 46 y.o. 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   MEDICAL HISTORY:  [name], seen in request by   Michael Boston, MD [address] [place],  [place] [address], Jacalyn Lefevre Jesse Sans, MD   I reviewed and summarized the referring note. PMHX. HTN Obesity.  Patient was seen by Dr. Jannifer Franklin, later Sarah critical illness neuropathy and myopathy following his prolonged severe COVID infection in December 2020,  He has a history of asthma, developed severe COVID-pneumonia, ARDS, including 2 months on VV ECMO with tracheostomy, sleep study showed nocturnal hypoxemia, receiving 2 L nasal cannula,  He continues to have ongoing issue with bilateral nonhealing wound, left side worse than right, also endorsed 20 pounds weight gain over the past few months,  He continue has bilateral lower extremity numbness, weakness, came in wheelchair today, has left wound care boots, right side in wrap.  He can bear weight, but only for short while, denies significant upper extremity weakness, has numbness at bilateral superficial radial sensory nerve distribution, following his prolonged facedown posturing due to his COVID infection  He is also taking Cymbalta 60 mg daily, trazodone 200 mg at night for depression, insomnia, he feels frustrated with his prolonged illness,    PHYSICAL EXAM:   Vitals:   01/11/22 1112  BP: (!)  146/90  Pulse: 74     There is no height or weight on file to calculate BMI.  PHYSICAL EXAMNIATION:  Gen: NAD, conversant, well nourised, well groomed                     Cardiovascular: Regular rate rhythm, no peripheral edema, warm, nontender. Eyes: Conjunctivae clear without exudates or hemorrhage Neck: Supple, no carotid bruits. Pulmonary: Clear to auscultation bilaterally   NEUROLOGICAL EXAM:  MENTAL STATUS: Speech/cognition: Awake, alert, oriented to history taking and casual conversation CRANIAL NERVES: CN II: Visual fields are full to confrontation. Pupils are round equal and briskly reactive to light. CN III, IV, VI: extraocular movement are normal. No ptosis. CN V: Facial sensation is intact to light touch CN VII: Face is symmetric with normal eye closure  CN VIII: Hearing is normal to causal conversation. CN IX, X: Phonation is normal. CN XI: Head turning and shoulder shrug are intact  MOTOR: Bilateral upper extremity motor strength is normal, bilateral lower extremity proximal muscle strength is normal,  REFLEXES: Reflexes are 1 and symmetric at the biceps, triceps, knees, and wearing bilateral boots.    SENSORY: Mildly length-dependent decreased light touch to distal shin at the right side  COORDINATION: There is no trunk or limb dysmetria noted.  GAIT/STANCE: Deferred  REVIEW OF SYSTEMS:  Full 14 system review of systems performed and notable only for as above All other review of systems were negative.   ALLERGIES: No Known Allergies  HOME MEDICATIONS: Current Outpatient Medications  Medication Sig Dispense Refill   albuterol (  VENTOLIN HFA) 108 (90 Base) MCG/ACT inhaler INHALE 2 PUFFS INTO THE LUNGS EVERY 4 HOURS AS NEEDED FOR WHEEZING OR SHORTNESS OF BREATH. 18 g 1   amLODipine (NORVASC) 2.5 MG tablet Take 1 tablet (2.5 mg total) by mouth daily. 30 tablet 5   budesonide-formoterol (SYMBICORT) 160-4.5 MCG/ACT inhaler Inhale 2 puffs into the lungs 2  (two) times daily. 10.2 g 5   DULoxetine (CYMBALTA) 60 MG capsule Take 1 capsule (60 mg total) by mouth daily. 90 capsule 3   lamoTRIgine (LAMICTAL) 25 MG tablet TAKE TWO TABLETS AT ONCE DAILY. 180 tablet 0   metoprolol tartrate (LOPRESSOR) 25 MG tablet TAKE 1 TABLET BY MOUTH TWICE A DAY 180 tablet 3   montelukast (SINGULAIR) 10 MG tablet Take 1 tablet (10 mg total) by mouth daily. 30 tablet 5   pantoprazole (PROTONIX) 40 MG tablet Take 1 tablet by mouth daily.     traZODone (DESYREL) 100 MG tablet Take 2 tablets (200 mg total) by mouth at bedtime. 180 tablet 3   triamcinolone (NASACORT) 55 MCG/ACT AERO nasal inhaler Place 2 sprays into the nose daily.     No current facility-administered medications for this visit.    PAST MEDICAL HISTORY: Past Medical History:  Diagnosis Date   Anginal pain ([place])    with covid   Anxiety    Asthma    Dyspnea    GERD (gastroesophageal reflux disease)    Headache    History of kidney stones    LEFT URETERAL STONE   HTN (hypertension)    Pancreatitis 2018   GALLBALDDER SLUDGE CAUSED ISSUED RESOLVED   Pneumonia 07/2019   covid    PAST SURGICAL HISTORY: Past Surgical History:  Procedure Laterality Date   CANNULATION FOR ECMO (EXTRACORPOREAL MEMBRANE OXYGENATION) N/A 08/28/2019   Procedure: CANNULATION FOR VV ECMO (EXTRACORPOREAL MEMBRANE OXYGENATION);  Surgeon: Prescott Gum, Collier Salina, MD;  Location: [place];  Service: Open Heart Surgery;  Laterality: N/A;  CRESCENT CANNULA   CANNULATION FOR ECMO (EXTRACORPOREAL MEMBRANE OXYGENATION) N/A 09/10/2019   Procedure: CANNULATION FOR ECMO (EXTRACORPOREAL MEMBRANE OXYGENATION) PUTTING IN CRESCENT 32FR CANNULA  AND REMOVING GROING CANNULATION;  Surgeon: Wonda Olds, MD;  Location: [facility];  Service: Open Heart Surgery;  Laterality: N/A;  PUTTING IN CRESCENT/REMOVING GROIN CANNULATION   CYSTOSCOPY/URETEROSCOPY/HOLMIUM LASER/STENT PLACEMENT Left 04/18/2019   Procedure: LEFT URETEROSCOPY/HOLMIUM LASER/STENT PLACEMENT;   Surgeon: Ardis Hughs, MD;  Location: [facility];  Service: Urology;  Laterality: Left;   CYSTOSCOPY/URETEROSCOPY/HOLMIUM LASER/STENT PLACEMENT Left 05/02/2019   Procedure: CYSTOSCOPY/URETEROSCOPY/HOLMIUM LASER/STENT EXCHANGE;  Surgeon: Ardis Hughs, MD;  Location: [facility];  Service: Urology;  Laterality: Left;   ECMO CANNULATION N/A 08/03/2019   Procedure: ECMO CANNULATION;  Surgeon: Wonda Olds, MD;  Location: [facility];  Service: Cardiovascular;  Laterality: N/A;   ESOPHAGOGASTRODUODENOSCOPY N/A 09/11/2019   Procedure: ESOPHAGOGASTRODUODENOSCOPY (EGD);  Surgeon: Wonda Olds, MD;  Location: [facility];  Service: Thoracic;  Laterality: N/A;   GRAFT APPLICATION Bilateral 7/34/1937   Procedure: APPLICATION OF SKIN GRAFT BILATERAL FEET;  Surgeon: Edrick Kins, DPM;  Location: [facility];  Service: Podiatry;  Laterality: Bilateral;   IR REPLC GASTRO/COLONIC TUBE PERCUT W/FLUORO  10/14/2019   IRRIGATION AND DEBRIDEMENT SHOULDER Right 09/29/2017   Procedure: IRRIGATION AND DEBRIDEMENT SHOULDER;  Surgeon: Leandrew Koyanagi, MD;  Location: [place];  Service: Orthopedics;  Laterality: Right;   LUMBAR DISC SURGERY  2002   NASAL ENDOSCOPY WITH EPISTAXIS CONTROL N/A 08/31/2019   Procedure: NASAL ENDOSCOPY WITH  EPISTAXIS CONTROL WITH CAUTERIZATION;  Surgeon: Melida Quitter, MD;  Location: [place];  Service: ENT;  Laterality: N/A;   PORTACATH PLACEMENT N/A 09/11/2019   Procedure: PEG TUBE INSERTION - BEDSIDE;  Surgeon: Wonda Olds, MD;  Location: [place];  Service: Thoracic;  Laterality: N/A;   TEE WITHOUT CARDIOVERSION N/A 08/28/2019   Procedure: TRANSESOPHAGEAL ECHOCARDIOGRAM (TEE);  Surgeon: Prescott Gum, Collier Salina, MD;  Location: [place];  Service: Open Heart Surgery;  Laterality: N/A;   TEE WITHOUT CARDIOVERSION N/A 09/10/2019   Procedure: TRANSESOPHAGEAL ECHOCARDIOGRAM (TEE);  Surgeon: Wonda Olds, MD;  Location: [place];  Service: Open Heart Surgery;  Laterality: N/A;   WOUND  DEBRIDEMENT Bilateral 03/27/2020   Procedure: EXCISIONAL DEBRIDEMENT OF ULCERS BILATERAL FEET;  Surgeon: Edrick Kins, DPM;  Location: [facility];  Service: Podiatry;  Laterality: Bilateral;    FAMILY HISTORY: Family History  Problem Relation Age of Onset   Asthma Brother    Diabetes Father    Hypertension Father    Diabetes Paternal Grandmother    Hypertension Paternal Grandmother    Migraines Mother    GER disease Mother    Pancreatic cancer Maternal Grandmother    Heart disease Maternal Grandfather     SOCIAL HISTORY: Social History   Socioeconomic History   Marital status: Married    Spouse name: Not on file   Number of children: 1   Years of education: Not on file   Highest education level: Not on file  Occupational History   Occupation: delivery driver  Tobacco Use   Smoking status: Never   Smokeless tobacco: Never  Vaping Use   Vaping Use: Never used  Substance and Sexual Activity   Alcohol use: Yes    Comment: rarely 2-3 times a year   Drug use: No   Sexual activity: Yes    Partners: Female  Other Topics Concern   Not on file  Social History Narrative   Not on file   Social Determinants of Health   Financial Resource Strain: Not on file  Food Insecurity: Not on file  Transportation Needs: Not on file  Physical Activity: Not on file  Stress: Not on file  Social Connections: Not on file  Intimate Partner Violence: Not on file   Total time spent reviewing the chart, obtaining history, examined patient, ordering tests, documentation, consultations and family, care coordination was 29 minutes        Marcial Pacas, M.D. Ph.D.  [facility] [address] [place], [place] [address] Ph: [phone] Fax: [phone]  CC:  Michael Boston, MD [place],  [place] [address]  Jacalyn Lefevre Jesse Sans, MD

## 2022-01-13 ENCOUNTER — Encounter (HOSPITAL_BASED_OUTPATIENT_CLINIC_OR_DEPARTMENT_OTHER): Payer: PPO | Admitting: General Surgery

## 2022-01-13 DIAGNOSIS — L97512 Non-pressure chronic ulcer of other part of right foot with fat layer exposed: Secondary | ICD-10-CM | POA: Diagnosis not present

## 2022-01-13 NOTE — Progress Notes (Signed)
[name], [name] ([id number]) Visit Report for 01/13/2022 Arrival Information Details Patient Name: Date of Service: [name], [place] 01/13/2022 9:30 A M Medical Record Number: [id number] Patient Account Number: [id number] Date of Birth/Sex: Treating RN: 06/07/1976 (46 y.o. [name] Primary Care Tynia Wiers: [name] Other Clinician: Referring Vipul Cafarelli: Treating Damon Baisch/Extender: [name] in Treatment: 37 Visit Information History Since Last Visit Added or deleted any medications: No Patient Arrived: Wheel Chair Any new allergies or adverse reactions: No Arrival Time: 09:55 Had a fall or experienced change in No Accompanied By: wife activities of daily living that may affect Transfer Assistance: None risk of falls: Patient Identification Verified: Yes Signs or symptoms of abuse/neglect since last visito No Secondary Verification Process Completed: Yes Hospitalized since last visit: No Patient Requires Transmission-Based Precautions: No Implantable device outside of the clinic excluding No Patient Has Alerts: No cellular tissue based products placed in the center since last visit: Has Dressing in Place as Prescribed: Yes Has Footwear/Offloading in Place as Prescribed: Yes Left: T Contact Cast otal Pain Present Now: No Electronic Signature(s) Signed: 01/13/2022 5:12:12 PM By: Levan Hurst RN, BSN Entered By: Levan Hurst on 01/13/2022 09:56:35 -------------------------------------------------------------------------------- Encounter Discharge Information Details Patient Name: Date of Service: [name], [place]. 01/13/2022 9:30 A M Medical Record Number: [id number] Patient Account Number: [id number] Date of Birth/Sex: Treating RN: 08-17-1975 (46 y.o. [name] Primary Care Kieren Ricci: [name] Other Clinician: Referring Ashlynn Gunnels: Treating Jobanny Mavis/Extender: [name] in Treatment: 69 Encounter Discharge  Information Items Post Procedure Vitals Discharge Condition: Stable Temperature (F): 98 Ambulatory Status: Wheelchair Pulse (bpm): 80 Discharge Destination: Home Respiratory Rate (breaths/min): 18 Transportation: Private Auto Blood Pressure (mmHg): 140/75 Accompanied By: son and spouse Schedule Follow-up Appointment: Yes Clinical Summary of Care: Patient Declined Electronic Signature(s) Signed: 01/13/2022 7:01:21 PM By: [name] Entered By: [name] on 01/13/2022 19:00:53 -------------------------------------------------------------------------------- Lower Extremity Assessment Details Patient Name: Date of Service: [name], [place] 01/13/2022 9:30 A M Medical Record Number: [id number] Patient Account Number: [id number] Date of Birth/Sex: Treating RN: October 08, 1975 (46 y.o. [name] Primary Care Arsen Mangione: [name] Other Clinician: Referring Keaun Schnabel: Treating Lorn Butcher/Extender: [name] in Treatment: 78 Edema Assessment Assessed: [name]: No] [name]: No] Edema: [Left: No] [Right: No] Calf Left: Right: Point of Measurement: 29 cm From Medial Instep 41 cm 43 cm Ankle Left: Right: Point of Measurement: 9 cm From Medial Instep 23.5 cm 25 cm Vascular Assessment Pulses: Dorsalis Pedis Palpable: [Right:Yes] Electronic Signature(s) Signed: 01/13/2022 5:12:12 PM By: Levan Hurst RN, BSN Entered By: Levan Hurst on 01/13/2022 10:02:38 -------------------------------------------------------------------------------- Multi Wound Chart Details Patient Name: Date of Service: [name], [place] 01/13/2022 9:30 A M Medical Record Number: [id number] Patient Account Number: [id number] Date of Birth/Sex: Treating RN: Jan 13, 1976 (46 y.o. [name] Primary Care Itzell Bendavid: [name] Other Clinician: Referring Kareema Keitt: Treating Rakan Soffer/Extender: [name] in Treatment: 6 Vital Signs Height(in):  69 Pulse(bpm): 80 Weight(lbs): 14 Blood Pressure(mmHg): 140/75 Body Mass Index(BMI): 41.3 Temperature(F): 98.0 Respiratory Rate(breaths/min): 18 Photos: [1:Right Calcaneus] [2:Left Calcaneus] [4:Left, Plantar Foot] Wound Location: [1:Pressure Injury] [2:Pressure Injury] [4:Other Lesion] Wounding Event: [1:Pressure Ulcer] [2:Pressure Ulcer] [4:Lesion] Primary Etiology: [1:Asthma, Angina, Hypertension] [2:Asthma, Angina, Hypertension] [4:Asthma, Angina, Hypertension] Comorbid History: [1:10/07/2019] [2:10/07/2019] [4:12/10/2021] Date Acquired: [1:78] [2:78] [4:1] Weeks of Treatment: [1:Open] [2:Open] [4:Open] Wound Status: [1:No] [2:No] [4:No] Wound Recurrence: [1:0.9x0.3x0.2] [2:2.1x1.4x0.2] [4:0x0x0] Measurements L x W x D (cm) [1:0.212] [0:5.697] [4:0]  A (cm) : rea [1:0.042] [2:0.462] [4:0] Volume (cm) : [1:99.30%] [2:91.50%] [4:100.00%] % Reduction in A rea: [1:99.90%] [2:98.30%] [4:100.00%] % Reduction in Volume: [1:Category/Stage III] [2:Category/Stage III] [4:Full Thickness Without Exposed] Classification: [1:Medium] [2:Medium] [4:Support Structures None Present] Exudate A mount: [1:Serosanguineous] [2:Serosanguineous] [4:N/A] Exudate Type: [1:red, brown] [2:red, brown] [4:N/A] Exudate Color: [1:Thickened] [2:Distinct, outline attached] [4:N/A] Wound Margin: [1:Large (67-100%)] [2:Medium (34-66%)] [4:None Present (0%)] Granulation A mount: [1:Red, Pink] [2:Pink, Pale] [4:N/A] Granulation Quality: [1:Small (1-33%)] [2:Medium (34-66%)] [4:None Present (0%)] Necrotic A mount: [1:Eschar, Adherent Slough] [2:Adherent Slough] [4:N/A] Necrotic Tissue: [1:Fat Layer (Subcutaneous Tissue): Yes Fat Layer (Subcutaneous Tissue): Yes Fascia: No] Exposed Structures: [1:Fascia: No Tendon: No Muscle: No Joint: No Bone: No Medium (34-66%)] [2:Fascia: No Tendon: No Muscle: No Joint: No Bone: No Medium (34-66%)] [4:Fat Layer (Subcutaneous Tissue): No Tendon: No Muscle: No Joint: No Bone: No Large  (67-100%)] Epithelialization: [1:Debridement - Excisional] [2:Debridement - Excisional] [4:N/A] Debridement: Pre-procedure Verification/Time Out 10:21 [2:10:21] [4:N/A] Taken: [1:Other] [2:Other] [4:N/A] Pain Control: [1:Subcutaneous, Slough] [2:Subcutaneous, Slough] [4:N/A] Tissue Debrided: [1:Skin/Subcutaneous Tissue] [2:Skin/Subcutaneous Tissue] [4:N/A] Level: [1:0.27] [2:2.94] [4:N/A] Debridement A (sq cm): [1:rea Curette] [2:Curette] [4:N/A] Instrument: [1:Minimum] [2:Minimum] [4:N/A] Bleeding: [1:Pressure] [2:Pressure] [4:N/A] Hemostasis A chieved: [1:0] [2:0] [4:N/A] Procedural Pain: [1:0] [2:0] [4:N/A] Post Procedural Pain: [1:Procedure was tolerated well] [2:Procedure was tolerated well] [4:N/A] Debridement Treatment Response: [1:0.9x0.3x0.2] [2:2.1x1.4x0.2] [4:N/A] Post Debridement Measurements L x W x D (cm) [1:0.042] [2:0.462] [4:N/A] Post Debridement Volume: (cm) [1:Category/Stage III] [2:Category/Stage III] [4:N/A] Post Debridement Stage: [1:Debridement] [2:Debridement] [4:N/A] Treatment Notes Electronic Signature(s) Signed: 01/13/2022 10:41:48 AM By: Fredirick Maudlin MD FACS Signed: 01/13/2022 7:01:21 PM By: [name] Entered By: Fredirick Maudlin on 01/13/2022 10:41:48 -------------------------------------------------------------------------------- Multi-Disciplinary Care Plan Details Patient Name: Date of Service: [name], [name] 01/13/2022 9:30 A M Medical Record Number: [id number] Patient Account Number: [id number] Date of Birth/Sex: Treating RN: 02-03-1976 (45 y.o. [name] Primary Care Emiel Kielty: [name] Other Clinician: Referring Fizza Scales: Treating Cal Gindlesperger/Extender: [name] in Treatment: 57 Multidisciplinary Care Plan reviewed with physician Active Inactive Wound/Skin Impairment Nursing Diagnoses: Impaired tissue integrity Knowledge deficit related to ulceration/compromised skin  integrity Goals: Patient/caregiver will verbalize understanding of skin care regimen Date Initiated: 07/15/2020 Target Resolution Date: 02/04/2022 Goal Status: Active Ulcer/skin breakdown will have a volume reduction of 30% by week 4 Date Initiated: 07/15/2020 Date Inactivated: 08/20/2020 Target Resolution Date: 09/03/2020 Goal Status: Unmet Unmet Reason: no major changes. Ulcer/skin breakdown will heal within 14 weeks Date Initiated: 12/04/2020 Date Inactivated: 12/10/2020 Target Resolution Date: 12/10/2020 Unmet Reason: wounds still open at 14 Goal Status: Unmet weeks and today 21 weeks. Interventions: Assess patient/caregiver ability to obtain necessary supplies Assess patient/caregiver ability to perform ulcer/skin care regimen upon admission and as needed Assess ulceration(s) every visit Provide education on ulcer and skin care Treatment Activities: Skin care regimen initiated : 07/15/2020 Topical wound management initiated : 07/15/2020 Notes: Electronic Signature(s) Signed: 01/13/2022 7:01:21 PM By: [name] Entered By: [name] on 01/13/2022 18:56:04 -------------------------------------------------------------------------------- Pain Assessment Details Patient Name: Date of Service: [name], [place] 01/13/2022 9:30 A M Medical Record Number: [id number] Patient Account Number: [id number] Date of Birth/Sex: Treating RN: Jul 08, 1976 (46 y.o. [name] Primary Care Sanah Kraska: [name] Other Clinician: Referring Ameila Weldon: Treating Shantasia Hunnell/Extender: [name] in Treatment: 15 Active Problems Location of Pain Severity and Description of Pain Patient Has Paino No Site Locations Pain Management and Medication Current Pain Management: Electronic Signature(s) Signed: 01/13/2022 5:12:12 PM By: Donnal Debar  Briant Cedar RN, BSN Entered By: Levan Hurst on 01/13/2022  09:56:57 -------------------------------------------------------------------------------- Patient/Caregiver Education Details Patient Name: Date of Service: [name], [name] 6/8/2023andnbsp9:30 A M Medical Record Number: [id number] Patient Account Number: [id number] Date of Birth/Gender: Treating RN: Oct 24, 1975 (46 y.o. [name] Primary Care Physician: [name] Other Clinician: Referring Physician: Treating Physician/Extender: [name] in Treatment: 35 Education Assessment Education Provided To: Patient Education Topics Provided Wound/Skin Impairment: Methods: Explain/Verbal Responses: Return demonstration correctly Electronic Signature(s) Signed: 01/13/2022 7:01:21 PM By: [name] Entered By: [name] on 01/13/2022 18:56:21 -------------------------------------------------------------------------------- Wound Assessment Details Patient Name: Date of Service: [name], [place] 01/13/2022 9:30 A M Medical Record Number: [id number] Patient Account Number: [id number] Date of Birth/Sex: Treating RN: [birth year] (46 y.o. [name] Primary Care Azir Muzyka: [name] Other Clinician: Referring Aleksa Collinsworth: Treating Jassen Sarver/Extender: [name] in Treatment: 78 Wound Status Wound Number: 1 Primary Etiology: Pressure Ulcer Wound Location: Right Calcaneus Wound Status: Open Wounding Event: Pressure Injury Comorbid History: Asthma, Angina, Hypertension Date Acquired: 10/07/2019 Weeks Of Treatment: 78 Clustered Wound: No Photos Wound Measurements Length: (cm) 0.9 Width: (cm) 0.3 Depth: (cm) 0.2 Area: (cm) 0.212 Volume: (cm) 0.042 % Reduction in Area: 99.3% % Reduction in Volume: 99.9% Epithelialization: Medium (34-66%) Tunneling: No Undermining: No Wound Description Classification: Category/Stage III Wound Margin: Thickened Exudate Amount: Medium Exudate Type:  Serosanguineous Exudate Color: red, brown Foul Odor After Cleansing: No Slough/Fibrino Yes Wound Bed Granulation Amount: Large (67-100%) Exposed Structure Granulation Quality: Red, Pink Fascia Exposed: No Necrotic Amount: Small (1-33%) Fat Layer (Subcutaneous Tissue) Exposed: Yes Necrotic Quality: Eschar, Adherent Slough Tendon Exposed: No Muscle Exposed: No Joint Exposed: No Bone Exposed: No Treatment Notes Wound #1 (Calcaneus) Wound Laterality: Right Cleanser Normal Saline Discharge Instruction: Cleanse the wound with Normal Saline prior to applying a clean dressing using gauze sponges, not tissue or cotton balls. Wound Cleanser Discharge Instruction: Cleanse the wound with wound cleanser prior to applying a clean dressing using gauze sponges, not tissue or cotton balls. Peri-Wound Care Topical Primary Dressing PolyMem Silver Non-Adhesive Dressing, 4.25x4.25 in Discharge Instruction: Apply to wound bed as instructed Secondary Dressing Zetuvit Plus 4x8 in Discharge Instruction: Apply over primary dressing as directed. Secured With Elastic Bandage 4 inch (ACE bandage) Discharge Instruction: Secure with ACE bandage as directed. Kerlix Roll Sterile, 4.5x3.1 (in/yd) Discharge Instruction: Secure with Kerlix as directed. Compression Wrap Compression Stockings Add-Ons Electronic Signature(s) Signed: 01/13/2022 5:12:12 PM By: Levan Hurst RN, BSN Entered By: Levan Hurst on 01/13/2022 10:05:44 -------------------------------------------------------------------------------- Wound Assessment Details Patient Name: Date of Service: [name], [name] 01/13/2022 9:30 A M Medical Record Number: [id number] Patient Account Number: [id number] Date of Birth/Sex: Treating RN: July 20, 1976 (46 y.o. [name] Primary Care Antonette Hendricks: [name] Other Clinician: Referring Mearl Harewood: Treating Ambrielle Kington/Extender: [name] in Treatment: 78 Wound  Status Wound Number: 2 Primary Etiology: Pressure Ulcer Wound Location: Left Calcaneus Wound Status: Open Wounding Event: Pressure Injury Comorbid History: Asthma, Angina, Hypertension Date Acquired: 10/07/2019 Weeks Of Treatment: 78 Clustered Wound: No Photos Wound Measurements Length: (cm) 2.1 Width: (cm) 1.4 Depth: (cm) 0.2 Area: (cm) 2.309 Volume: (cm) 0.462 % Reduction in Area: 91.5% % Reduction in Volume: 98.3% Epithelialization: Medium (34-66%) Tunneling: No Undermining: No Wound Description Classification: Category/Stage III Wound Margin: Distinct, outline attached Exudate Amount: Medium Exudate Type: Serosanguineous Exudate Color: red, brown Foul Odor After Cleansing: No Slough/Fibrino Yes Wound Bed Granulation Amount: Medium (34-66%) Exposed Structure Granulation Quality: Pink,  Pale Fascia Exposed: No Necrotic Amount: Medium (34-66%) Fat Layer (Subcutaneous Tissue) Exposed: Yes Necrotic Quality: Adherent Slough Tendon Exposed: No Muscle Exposed: No Joint Exposed: No Bone Exposed: No Treatment Notes Wound #2 (Calcaneus) Wound Laterality: Left Cleanser Normal Saline Discharge Instruction: Cleanse the wound with Normal Saline prior to applying a clean dressing using gauze sponges, not tissue or cotton balls. Wound Cleanser Discharge Instruction: Cleanse the wound with wound cleanser prior to applying a clean dressing using gauze sponges, not tissue or cotton balls. Peri-Wound Care Zinc Oxide Ointment 30g tube Discharge Instruction: Apply Zinc Oxide to periwound with each dressing change Topical Primary Dressing Vendaje Secondary Dressing Zetuvit Plus 4x8 in Discharge Instruction: Apply over primary dressing as directed. Secured With [employer], 4.5x3.1 (in/yd) Discharge Instruction: Secure with Kerlix as directed. 63M Medipore H Soft Cloth Surgical T ape, 4 x 10 (in/yd) Discharge Instruction: Secure with tape as directed. Compression  Wrap Compression Stockings Add-Ons Electronic Signature(s) Signed: 01/13/2022 5:12:12 PM By: Levan Hurst RN, BSN Entered By: Levan Hurst on 01/13/2022 10:15:48 -------------------------------------------------------------------------------- Wound Assessment Details Patient Name: Date of Service: [name], [name] 01/13/2022 9:30 A M Medical Record Number: [id number] Patient Account Number: [id number] Date of Birth/Sex: Treating RN: 10-14-1975 (46 y.o. [name] Primary Care Shaday Rayborn: [name] Other Clinician: Referring Aayden Cefalu: Treating Tameaka Eichhorn/Extender: [name] in Treatment: 78 Wound Status Wound Number: 4 Primary Etiology: Lesion Wound Location: Left, Plantar Foot Wound Status: Open Wounding Event: Other Lesion Comorbid History: Asthma, Angina, Hypertension Date Acquired: 12/10/2021 Weeks Of Treatment: 1 Clustered Wound: No Photos Wound Measurements Length: (cm) Width: (cm) Depth: (cm) Area: (cm) Volume: (cm) 0 % Reduction in Area: 100% 0 % Reduction in Volume: 100% 0 Epithelialization: Large (67-100%) 0 Tunneling: No 0 Undermining: No Wound Description Classification: Full Thickness Without Exposed Support Structures Exudate Amount: None Present Foul Odor After Cleansing: No Slough/Fibrino No Wound Bed Granulation Amount: None Present (0%) Exposed Structure Necrotic Amount: None Present (0%) Fascia Exposed: No Fat Layer (Subcutaneous Tissue) Exposed: No Tendon Exposed: No Muscle Exposed: No Joint Exposed: No Bone Exposed: No Electronic Signature(s) Signed: 01/13/2022 5:12:12 PM By: Levan Hurst RN, BSN Entered By: Levan Hurst on 01/13/2022 10:16:14 -------------------------------------------------------------------------------- [place] Details Patient Name: Date of Service: [name], [place]. 01/13/2022 9:30 A M Medical Record Number: [id number] Patient Account Number: [id number] Date of Birth/Sex: Treating  RN: 1975/08/24 (46 y.o. [name] Primary Care Yacine Droz: [name] Other Clinician: Referring Mckala Pantaleon: Treating Aalani Aikens/Extender: [name] in Treatment: 94 Vital Signs Time Taken: 09:55 Temperature (F): 98.0 Height (in): 69 Pulse (bpm): 80 Weight (lbs): 280 Respiratory Rate (breaths/min): 18 Body Mass Index (BMI): 41.3 Blood Pressure (mmHg): 140/75 Reference Range: 80 - 120 mg / dl Electronic Signature(s) Signed: 01/13/2022 5:12:12 PM By: Levan Hurst RN, BSN Entered By: Levan Hurst on 01/13/2022 09:56:52

## 2022-01-14 NOTE — Progress Notes (Signed)
CHIOKE, NOXON ([id number]) Visit Report for 01/13/2022 Chief Complaint Document Details Patient Name: Date of Service: [name], [place] 01/13/2022 9:30 A M Medical Record Number: [id number] Patient Account Number: [id number] Date of Birth/Sex: Treating RN: Jul 13, 1976 (46 y.o. [name] Primary Care Provider: [name] Other Clinician: Referring Provider: Treating Provider/Extender: [name] in Treatment: 55 Information Obtained from: Patient Chief Complaint Bilateral Plantar Foot Ulcers Electronic Signature(s) Signed: 01/13/2022 10:41:56 AM By: Fredirick Maudlin MD FACS Entered By: Fredirick Maudlin on 01/13/2022 10:41:56 -------------------------------------------------------------------------------- Cellular or Tissue Based Product Details Patient Name: Date of Service: [name], [place] 01/13/2022 9:30 A M Medical Record Number: [id number] Patient Account Number: [id number] Date of Birth/Sex: Treating RN: [birth year] (46 y.o. [name] Primary Care Provider: [name] Other Clinician: Referring Provider: Treating Provider/Extender: [name] in Treatment: 41 Cellular or Tissue Based Product Type Wound #2 Left Calcaneus Applied to: Performed By: Physician Fredirick Maudlin, MD Cellular or Tissue Based Product Type: Other Level of Consciousness (Pre-procedure): Awake and Alert Pre-procedure Verification/Time Out Yes - 10:21 Taken: Location: trunk / arms / legs Wound Size (sq cm): 2.94 Product Size (sq cm): 8 Waste Size (sq cm): 0 Amount of Product Applied (sq cm): 8 Instrument Used: Forceps, Scissors Lot #: [id number] Order #: 5 Expiration Date: 10/12/2024 Fenestrated: No Reconstituted: No Secured: Yes Secured With: Steri-Strips Dressing Applied: Yes Primary Dressing: Adaptic;gauze Procedural Pain: 0 Post Procedural Pain: 0 Response to Treatment: Procedure was tolerated well Level of  Consciousness (Post- Awake and Alert procedure): Post Procedure Diagnosis Same as Pre-procedure Notes **** TRIAL***** Vendaje DO NOT CHARGE Electronic Signature(s) Signed: 01/13/2022 7:01:21 PM By: [name] Signed: 01/14/2022 7:29:40 AM By: Fredirick Maudlin MD FACS Entered By: [name] on 01/13/2022 18:59:51 -------------------------------------------------------------------------------- Debridement Details Patient Name: Date of Service: [name], [place] 01/13/2022 9:30 A M Medical Record Number: [id number] Patient Account Number: [id number] Date of Birth/Sex: Treating RN: 1976-07-14 (47 y.o. [name] Primary Care Provider: [name] Other Clinician: Referring Provider: Treating Provider/Extender: [name] in Treatment: 78 Debridement Performed for Assessment: Wound #2 Left Calcaneus Performed By: Physician Fredirick Maudlin, MD Debridement Type: Debridement Level of Consciousness (Pre-procedure): Awake and Alert Pre-procedure Verification/Time Out Yes - 10:21 Taken: Start Time: 10:21 Pain Control: Other : benzocaine 20% T Area Debrided (L x W): otal 2.1 (cm) x 1.4 (cm) = 2.94 (cm) Tissue and other material debrided: Non-Viable, Slough, Subcutaneous, Slough Level: Skin/Subcutaneous Tissue Debridement Description: Excisional Instrument: Curette Bleeding: Minimum Hemostasis Achieved: Pressure End Time: 10:23 Procedural Pain: 0 Post Procedural Pain: 0 Response to Treatment: Procedure was tolerated well Level of Consciousness (Post- Awake and Alert procedure): Post Debridement Measurements of Total Wound Length: (cm) 2.1 Stage: Category/Stage III Width: (cm) 1.4 Depth: (cm) 0.2 Volume: (cm) 0.462 Character of Wound/Ulcer Post Debridement: Improved Post Procedure Diagnosis Same as Pre-procedure Electronic Signature(s) Signed: 01/13/2022 12:34:21 PM By: Fredirick Maudlin MD FACS Signed: 01/13/2022 7:01:21 PM By:  [name] Entered By: [name] on 01/13/2022 10:26:39 -------------------------------------------------------------------------------- Debridement Details Patient Name: Date of Service: [name], [name] 01/13/2022 9:30 A M Medical Record Number: [id number] Patient Account Number: [id number] Date of Birth/Sex: Treating RN: Jun 17, 1976 (46 y.o. [name] Primary Care Provider: [name] Other Clinician: Referring Provider: Treating Provider/Extender: [name] in Treatment: 78 Debridement Performed for Assessment: Wound #1 Right Calcaneus Performed By: Physician Fredirick Maudlin, MD Debridement Type: Debridement Level of Consciousness (  Pre-procedure): Awake and Alert Pre-procedure Verification/Time Out Yes - 10:21 Taken: Start Time: 10:21 Pain Control: Other : benzocaine 20% T Area Debrided (L x W): otal 0.9 (cm) x 0.3 (cm) = 0.27 (cm) Tissue and other material debrided: Non-Viable, Slough, Subcutaneous, Slough Level: Skin/Subcutaneous Tissue Debridement Description: Excisional Instrument: Curette Bleeding: Minimum Hemostasis Achieved: Pressure End Time: 10:23 Procedural Pain: 0 Post Procedural Pain: 0 Response to Treatment: Procedure was tolerated well Level of Consciousness (Post- Awake and Alert procedure): Post Debridement Measurements of Total Wound Length: (cm) 0.9 Stage: Category/Stage III Width: (cm) 0.3 Depth: (cm) 0.2 Volume: (cm) 0.042 Character of Wound/Ulcer Post Debridement: Improved Post Procedure Diagnosis Same as Pre-procedure Electronic Signature(s) Signed: 01/13/2022 12:34:21 PM By: Fredirick Maudlin MD FACS Signed: 01/13/2022 7:01:21 PM By: [name] Entered By: [name] on 01/13/2022 10:27:23 -------------------------------------------------------------------------------- HPI Details Patient Name: Date of Service: [name], [name] 01/13/2022 9:30 A M Medical Record Number:  [id number] Patient Account Number: [id number] Date of Birth/Sex: Treating RN: [birth year] (46 y.o. [name] Primary Care Provider: [name] Other Clinician: Referring Provider: Treating Provider/Extender: [name] in Treatment: 86 History of Present Illness HPI Description: Wounds are12/03/2020 upon evaluation today patient presents for initial inspection here in our clinic concerning issues he has been having with the bottoms of his feet bilaterally. He states these actually occurred as wounds when he was hospitalized for 5 months secondary to Covid. He was apparently with tilting bed where he was in an upright position quite frequently and apparently this occurred in some way shape or form during that time. Fortunately there is no sign of active infection at this time. No fevers, chills, nausea, vomiting, or diarrhea. With that being said he still has substantial wounds on the plantar aspects of his feet Theragen require quite a bit of work to get these to heal. He has been using Santyl currently though that is been problematic both in receiving the medication as well as actually paid for it as it is become quite expensive. Prior to the experience with Covid the patient really did not have any major medical problems other than hypertension he does have some mild generalized weakness following the Covid experience. 07/22/2020 on evaluation today patient appears to be doing okay in regard to his foot ulcers I feel like the wound beds are showing signs of better improvement that I do believe the Iodoflex is helping in this regard. With that being said he does have a lot of drainage currently and this is somewhat blue/green in nature which is consistent with Pseudomonas. I do think a culture today would be appropriate for [place] to evaluate and see if that is indeed the case I would likely start him on antibiotic orally as well he is not allergic to Cipro knows of no  issues he has had in the past 12/21; patient was admitted to the clinic earlier this month with bilateral presumed pressure ulcers on the bottom of his feet apparently related to excessive pressure from a tilt table arrangement in the intensive care unit. Patient relates this to being on ECMO but I am not really sure that is exactly related to that. I must say I have never seen anything like this. He has fairly extensive full-thickness wounds extending from his heel towards his midfoot mostly centered laterally. There is already been some healing distally. He does not appear to have an arterial issue. He has been using gentamicin to the wound surfaces with Iodoflex to  help with ongoing debridement 1/6; this is a patient with pressure ulcers on the bottom of his feet related to excessive pressure from a standing position in the intensive care unit. He is complaining of a lot of pain in the right heel. He is not a diabetic. 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 We have been using Iodoflex to help prepare the surfaces of both wounds for an advanced treatment product. He is nonambulatory spending most of his time in a wheelchair I have asked him not to propel the wheelchair with his heels 1/13; in general his wounds look better not much surface area change we have been using Iodoflex as of last week. I did an x-ray of the right heel as the patient was complaining of pain. I had some thoughts about a stress fracture perhaps Achilles tendon problems however what it showed was erosive changes along the inferior aspect of the calcaneus he now has a MRI booked for 1/20. 1/20; in general his wounds continue to be better. Some improvement in the large narrow areas proximally in his foot. He is still complaining of pain in the right heel and tenderness in certain areas of this wound. His MRI is tonight. I am not just looking for osteomyelitis that was brought up on the x-ray I am  wondering about stress fractures, tendon ruptures etc. He has no such findings on the left. Also noteworthy is that the patient had critical illness neuropathy and some of the discomfort may be actual improvement in nerve function I am just not sure. These wounds were initially in the setting of severe critical illness related to COVID-19. He was put in a standing position. He may have also been on pressors at the point contributing to tissue ischemia. By his description at some point these wounds were grossly necrotic extending proximally up into the Achilles part of his heel. I do not know that I have ever really seen pictures of them like this although they may exist in epic We have ordered Tri layer Oasis. I am trying to stimulate some granulation in these areas. This is of course assuming the MRI is negative for infection 1/27; since the patient was last here he saw Dr. Juleen China of infectious disease. He is planned for vancomycin and ceftriaxone. Prior operative culture grew MSSA. Also ordered baseline lab work. He also ordered arterial studies although the ABIs in our clinic were normal as well as his clinical exam these were normal I do not think he needs to see vascular surgery. His ABIs at the [facility] were 1.22 in the right triphasic waveforms with a normal TBI of 1.15 on the left ABI of 1.22 with triphasic waveforms and a normal TBI of 1.08. Finally he saw Dr. Amalia Hailey who will follow him in for 2 months. At this point I do not think he felt that he needed a procedure on the right calcaneal bone. Dr. Juleen China is elected for broad-spectrum antibiotic The patient is still having pain in the right heel. He walks with a walker 2/3; wounds are generally smaller. He is tolerating his IV antibiotics. I believe this is vancomycin and ceftriaxone. We are still waiting for Oasis burn in terms of his out-of-pocket max which he should be meeting soon given the IV antibiotics, MRIs etc. I have asked him to check  in on this. We are using silver collagen in the meantime the wounds look better 2/10; tolerating IV vancomycin and Rocephin. We are waiting to apply  for Oasis. Although I am not really sure where he is in his out-of-pocket max. 2/17 started the first application of Oasis trilayer. Still on antibiotics. The wounds have generally look better. The area on the left has a little more surface slough requiring debridement 9/14; second application of Oasis trilayer. The wound surface granulation is generally look better. The area on the left with undermining laterally I think is come in a bit. 10/08/2020 upon evaluation today patient is here today for [employer] application #3. Fortunately he seems to be doing extremely well with regard to this and we are seeing a lot of new epithelial growth which is great news. Fortunately there is no signs of active infection at this time. 10/16/2020 upon evaluation today patient appears to be doing well with regard to his foot ulcers. Do believe the Oasis has been of benefit for him. I do not see any signs of infection right now which is great news and I think that he has a lot of new epithelial growth which is great to see as well. The patient is very pleased to hear all of this. I do think we can proceed with the Oasis trilayer #4 today. 3/18; not as much improvement in these areas on his heels that I was hoping. I did reapply trilateral Oasis today the tissue looks healthier but not as much fill in as I was hoping. 3/25; better looking today I think this is come in a bit the tissue looks healthier. Triple layer Oasis reapplied #6 4/1; somewhat better looking definitely better looking surface not as much change in surface area as I was hoping. He may be spending more time [place] on days then he needs to although he does have heel offloading boots. Triple layer Oasis reapplied #7 4/7; unfortunately apparently [facility] will not approve any further Oasis  which is unfortunate since the patient did respond nicely both in terms of the condition of the wound bed as well as surface area. There is still some drainage coming from the wound but not a lot there does not appear to be any infection 4/15; we have been using Hydrofera Blue. He continues to have nice rims of epithelialization on the right greater than the left. The left the epithelialization is coming from the tip of his heel. There is moderate drainage. In this that concerns me about a total contact cast. There is no evidence of infection 4/29; patient has been using Hydrofera Blue with dressing changes. He has no complaints or issues today. 5/5; using Hydrofera Blue. I actually think that he looks marginally better than the last time I saw this 3 weeks ago. There are rims of epithelialization on the left thumb coming from the medial side on the right. Using Hydrofera Blue 5/12; using Hydrofera Blue. These continue to make improvements in surface area. His drainage was not listed as severe I therefore went ahead and put a cast on the left foot. Right foot we will continue to dress his previous 5/16; back for first total contact cast change. He did not tolerate this particularly well cast injury on the anterior tibia among other issues. Difficulty sleeping. I talked him about this in some detail and afterwards is elected to continue. I told him I would like to have a cast on for 3 weeks to see if this is going to help at all. I think he agreed 5/19; I think the wound is better. There is no tunneling towards his midfoot. The  undermining medially also looks better. He has a rim of new skin distally. I think we are making progress here. The area on the left also continues to look somewhat better to me using Hydrofera Blue. He has a list of complaints about the cast but none of them seem serious 5/26; patient presents for 1 week follow-up. He has been using a total contact cast and tolerating this  well. Hydrofera Blue is the main dressing used. He denies signs of infection. 6/2 Hydrofera Blue total contact cast on the left. These were large ulcers that formed in intensive care unit where the patient was recovering from [place]. May have had something to do with being ventilated in an upright positiono Pressors etc. We have been able to get the areas down considerably and a viable surface. There is some epithelialization in both sides. Note made of drainage 6/9; changed to [facility] last time because of drainage. He arrives with better looking surfaces and dimensions on the left than the right. Paradoxically the right actually probes more towards his midfoot the left is largely close down but both of these look improved. Using a total contact cast on the left 6/16; complex wounds on his bilateral plantar heels which were initially pressure injury from a stay in the ICU with COVID. We have been using silver alginate most recently. His dimensions of come in quite dramatically however not recently. We have been putting the left foot in a total contact cast 6/23; complex wounds on the bilateral plantar heels. I been putting the left in the cast paradoxically the area on the right is the one that is going towards closure at a faster rate. Quite a bit of drainage on the left. The patient went to see Dr. Amalia Hailey who said he was going to standby for skin grafts. I had actually considered sending him for skin grafts however he would be mandatorily off his feet for a period of weeks to months. I am thinking that the area on the right is going to close on its own the area on the left has been more stubborn even though we have him in a total contact cast 6/30; took him out of a total contact cast last week is the right heel seem to be making better progress than the left where I was placing the cast. We are using silver alginate. Both wounds are smaller right greater than left 7/12; both wounds look as  though they are making some progress. We are using silver alginate. Heel offloading boots 7/26; very gradual progress especially on the right. Using silver alginate. He is wearing heel offloading boots 8/18; he continues to close these wounds down very gradually. Using silver alginate. The problem polymen being definitive about this is areas of what appears to be callus around the margins. This is not a 100% of the area but certainly sizable especially on the right 9/1; bilateral plantar feet wounds secondary to prolonged pressure while being ventilated for COVID-19 in an upright position. Essentially pressure ulcers on the bottom of his feet. He is made substantial progress using silver alginate. 9/14; bilateral plantar feet wounds secondary to prolonged pressure. Making progress using silver alginate. 9/29 bilateral plantar feet wounds secondary to prolonged pressure. I changed him to Iodoflex last week. MolecuLight showing reddened blush fluorescence 10/11; patient presents for follow-up. He has no issues or complaints today. He denies signs of infection. He continues to use Iodoflex and antibiotic ointment to the wound beds. 10/27; 2-week follow-up. No  evidence of infection. He has callus and thick dry skin around the wound margins we have been using Iodoflex and Bactroban which was in response to a moderate left MolecuLight reddish blush fluorescence. 11/10; 2-week follow-up. Wound margins again have thick callus however the measurements of the actual wound sites are a lot smaller. Everything looks reasonably healthy here. We have been using Iodoflex He was approved for prime matrix but I have elected to delay this given the improvement in the surface area. Hopefully I will not regret that decision as were getting close to the end of the year in terms of insurance payment 12/8; 2-week follow-up. Wounds are generally smaller in size. These were initially substantial wounds extending into the  forefoot all the way into the heel on the bilateral plantar feet. They are now both located on the plantar heel distal aspect both of these have a lot of callus around the wounds I used a #5 curette to remove this on the right and the left also some subcutaneous debris to try and get the wound edges were using Iodoflex. He has heel offloading shoe 12/22; 2-week follow-up. Not really much improvement. He has thick callus around the outer edges of both wounds. I remove this there is some nonviable subcutaneous tissue as well. We have been using Iodoflex. Her intake nurse and myself spontaneously thought of a total contact cast I went back in May. At that time we really were not seeing much of an improvement with a cast although the wound was in a much different situation I would like to retry this in 2 weeks and I discussed this with the patient 08/12/2021; the patient has had some improvement with the Iodoflex. The the area on the left heel plantar more improved than the right. I had to put him in a total contact cast on the left although I decided to put that off for 2 weeks. I am going to change his primary dressing to silver collagen. I think in both areas he has had some improvement most of the healing seems to be more proximal in the heel. The wounds are in the mid aspect. A lot of thick callus on the right heel however. 1/19; we are using silver collagen on both plantar heel areas. He has had some improvement today. The left did not require any debridement. He still had some eschar on the right that was debrided but both seem to have contracted. I did not put it total contact cast on him today 2/2 we have been using silver collagen. The area on the right plantar heel has areas that appear to be epithelialized interspersed with dry flaking callus and dry skin. I removed this. This really looks better than on the other side. On the left still a large area with raised edges and debris on the  surface. 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 He has no issues or complaints today. 2/9; not much change in the left foot wound with 1 week of a cast we are using silver collagen. Silver collagen on the right side. The right side has been the better wound surface. We will reapply the total contact cast on the left 2/16; not much improvement on either side I been using silver collagen with a total contact cast on the left. I'm changing the Hydrofera Blue still with a total contact  cast on the left 2/23; some improvement on both sides. Disappointing that he has thick callus around the area that we are putting in a total contact cast on the left. We've been using Hydrofera Blue on both wound areas. This is a man who at essentially pressure ulcers in addition to ischemia caused by medications to support his blood pressure (pressors) in the ICU. He was being ventilated in the standing position for severe Covid. A Shiley the wounds extended across his entire foot but are now localized to his plantar heels bilaterally. We have made progress however neither areas healed. I continue to think the total contact cast is helped albeit painstakingly slowly. He has never wanted a plastic surgery consult although I don't know that they would be interested in grafting in area in this location. 10/07/2021: Continued improvement bilaterally. There is still some callus around the left wound, despite the total contact cast. He has some increased pain in his right midfoot around 1 particular area. This has been painful in the past but seems to be a little bit worse. When his cast was removed today, there was an area on the heel of the left foot that looks a bit macerated. He is also complaining of pain in his left thigh and hip which he thinks is secondary to the limb length discrepancy  caused by the cast. 10/14/2021: He continues to improve. A little bit less callus around the left wound. He continues to endorse pain in his right midfoot, but this is not as significant as it was last week. The maceration on his left heel is improved. 10/21/2021: Continued improvement to both wounds. The maceration on his left heel is no longer evident. Less callus bilaterally. Epithelialization progressing. 10/28/2021: Significant improvement this week. The right sided wound is nearly closed with just a small open area at the middle. No maceration seen on the left heel. Continued epithelialization on both sides. No concern for infection. 11/04/2021: T oday, the wounds were measured a little bit differently and come out as larger, but I actually think they are about the same to potentially even smaller, particularly on the left. He continues to accumulate some callus on the right. 11/11/2021: T oday, the patient is expressing some concern that the left wound, despite being in the total contact cast, is not progressing at the same rate as the 1 on the right. He is interested in trying a week without the cast to see how the wound does. The wounds are roughly the same size as last week, with the right perhaps being a little bit smaller. He continues to build up callus on both sites. 11/18/2021: Last week, I permitted the patient to go without his total contact cast, just to see if the cast was really making any difference. Today, both wounds have deteriorated to some extent, suggesting that the cast is providing benefit, at least on the left. Both are larger and have accumulated callus, slough, and other debris. 11/26/2021: I debrided both wounds quite aggressively last week in an effort to stimulate the healing cascade. This appears to have been effective as the left sided wound is a full centimeter shorter in length. Although the right was measured slightly larger, on inspection, it looks as though an area of  epithelialized tissue was included in the measurements. We have been using PolyMem Ag on the wound surfaces with a total contact cast to the left. 12/02/2021: It appears that the intake personnel are including epithelialized tissue in his wound  measurements; the right wound is almost completely epithelialized; there is just a crater at the proximal midfoot with some open areas. On the left, he has built up some callus, but the overall wound surface looks good. There is some senescent skin around the wound margin. He has been in PolyMem Ag bilaterally with a total contact cast on the left. 12/09/2021: The right wound is nearly closed; there is just a small open area at the mid calcaneus. On the left, the wound is smaller with minimal callus buildup. No significant drainage. 12/16/2021: The right calcaneal wound remains minimally open at the mid calcaneus; the rest has epithelialized. On the left, the wound is also a little bit smaller. There is some senescent tissue on the periphery. He is getting his first application of a trial skin substitute called Vendaje today. 12/23/2021: The wound on his right calcaneus is nearly closed; there is just a small area at the most distal aspect of the calcaneus that is open. On the left, the area where we applied to the skin substitute has a healthier-looking bed of granulation tissue. The wound dimensions are not significantly different on this side but the wound surface is improved. 12/30/2021: The wound on the right calcaneus has not changed significantly aside from some accumulation of callus. On the left, the open area is smaller and continues to have an improved surface. He continues to accumulate callus around the wound. He is here for his third application of Vendaje. 01/06/2022: The right calcaneal wound is down to just a couple of millimeters. He continues to accumulate periwound callus. He unfortunately got his cast wet earlier in the week and his left foot is  macerated, resulting in some superficial skin loss just distal to the open wound. The open wound itself, however, is much smaller and has a healthier appearing surface. He is here for his fourth application of Vendaje. 01/13/2022: The right calcaneal wound is about the same. Unfortunately, once again, his cast got wet and his foot is again macerated. This is caused the left calcaneal wound to enlarge. He is here for his fifth application of Vendaje. Electronic Signature(s) Signed: 01/13/2022 10:44:31 AM By: Fredirick Maudlin MD FACS Entered By: Fredirick Maudlin on 01/13/2022 10:44:31 -------------------------------------------------------------------------------- Physical Exam Details Patient Name: Date of Service: [name], [name] 01/13/2022 9:30 A M Medical Record Number: [id number] Patient Account Number: [id number] Date of Birth/Sex: Treating RN: [birth year] (46 y.o. [name] Primary Care Provider: [name] Other Clinician: Referring Provider: Treating Provider/Extender: [name] in Treatment: 64 Constitutional Slightly hypertensive. . . . No acute distress.Marland Kitchen Respiratory Normal work of breathing on room air.. Notes 01/13/2022: The right calcaneal wound is basically unchanged with the typical periwound callus accumulation. On the left, the foot became macerated again during the week and this has caused the ulcer to enlarge. The surface, however, is clean and healthy-appearing. Electronic Signature(s) Signed: 01/13/2022 10:46:28 AM By: Fredirick Maudlin MD FACS Entered By: Fredirick Maudlin on 01/13/2022 10:46:28 -------------------------------------------------------------------------------- Physician Orders Details Patient Name: Date of Service: [name], [place] 01/13/2022 9:30 A M Medical Record Number: [id number] Patient Account Number: [id number] Date of Birth/Sex: Treating RN: 1976/08/02 (46 y.o. [name] Primary Care Provider: [name]  Other Clinician: Referring Provider: Treating Provider/Extender: [name] in Treatment: 50 Verbal / Phone Orders: No Diagnosis Coding ICD-10 Coding Code Description L97.512 Non-pressure chronic ulcer of other part of right foot with fat layer exposed L97.522 Non-pressure chronic ulcer  of other part of left foot with fat layer exposed Follow-up Appointments ppointment in 1 week. - Dr [name] - Room 3 Return A Cellular or Tissue Based Products Cellular or Tissue Based Product Type: - Vendaje #5 (trial) Cellular or Tissue Based Product applied to wound bed, secured with steri-strips, cover with Adaptic or Mepitel. (DO NOT REMOVE). Bathing/ Shower/ Hygiene May shower with protection but do not get wound dressing(s) wet. - Cover with cast protector (can purchase cast protector at [facility] or [facility] ) Edema Control - Lymphedema / SCD / Other Bilateral Lower Extremities Elevate legs to the level of the heart or above for 30 minutes daily and/or when sitting, a frequency of: - throughout the day Avoid standing for long periods of time. Moisturize legs daily. - right leg and foot every night. Off-Loading Total Contact Cast to Left Lower Extremity Wedge shoe to: - Globoped Shoe to right foot and left foot when not in TCC Other: - keep pressure off of the bottom of your feet Additional Orders / Instructions Follow Nutritious Diet Wound Treatment Wound #1 - Calcaneus Wound Laterality: Right Cleanser: Normal Saline (Generic) Every Other Day/30 Days Discharge Instructions: Cleanse the wound with Normal Saline prior to applying a clean dressing using gauze sponges, not tissue or cotton balls. Cleanser: Wound Cleanser (Generic) Every Other Day/30 Days Discharge Instructions: Cleanse the wound with wound cleanser prior to applying a clean dressing using gauze sponges, not tissue or cotton balls. Prim Dressing: PolyMem Silver Non-Adhesive Dressing, 4.25x4.25 in (Generic)  Every Other Day/30 Days ary Discharge Instructions: Apply to wound bed as instructed Secondary Dressing: Zetuvit Plus 4x8 in (DME) (Generic) Every Other Day/30 Days Discharge Instructions: Apply over primary dressing as directed. Secured With: Elastic Bandage 4 inch (ACE bandage) (DME) (Generic) Every Other Day/30 Days Discharge Instructions: Secure with ACE bandage as directed. Secured With: [employer], 4.5x3.1 (in/yd) (DME) (Generic) Every Other Day/30 Days Discharge Instructions: Secure with Kerlix as directed. Wound #2 - Calcaneus Wound Laterality: Left Cleanser: Normal Saline (Generic) 1 x Per Week/30 Days Discharge Instructions: Cleanse the wound with Normal Saline prior to applying a clean dressing using gauze sponges, not tissue or cotton balls. Cleanser: Wound Cleanser 1 x Per Week/30 Days Discharge Instructions: Cleanse the wound with wound cleanser prior to applying a clean dressing using gauze sponges, not tissue or cotton balls. Peri-Wound Care: Zinc Oxide Ointment 30g tube 1 x Per Week/30 Days Discharge Instructions: Apply Zinc Oxide to periwound with each dressing change Prim Dressing: Vendaje ary 1 x Per Week/30 Days Secondary Dressing: Optifoam Non-Adhesive Dressing, 4x4 in (DME) (Generic) 1 x Per Week/30 Days Discharge Instructions: Apply over primary dressing as directed. Secondary Dressing: Zetuvit Plus 4x8 in (DME) (Generic) 1 x Per Week/30 Days Discharge Instructions: Apply over primary dressing as directed. Secured With: [employer], 4.5x3.1 (in/yd) (DME) (Generic) 1 x Per Week/30 Days Discharge Instructions: Secure with Kerlix as directed. Secured With: 43M Medipore H Soft Cloth Surgical T ape, 4 x 10 (in/yd) (DME) (Generic) 1 x Per Week/30 Days Discharge Instructions: Secure with tape as directed. Electronic Signature(s) Signed: 01/13/2022 7:34:10 PM By: [name] Signed: 01/14/2022 7:29:40 AM By: Fredirick Maudlin MD FACS Previous Signature:  01/13/2022 12:34:21 PM Version By: Fredirick Maudlin MD FACS Previous Signature: 01/13/2022 7:01:21 PM Version By: [name] Entered By: [name] on 01/13/2022 19:19:55 -------------------------------------------------------------------------------- Problem List Details Patient Name: Date of Service: [name], [name] 01/13/2022 9:30 A M Medical Record Number: [id number] Patient Account Number:  [id number] Date of Birth/Sex: Treating RN: May 21, 1976 (46 y.o. [name] Primary Care Provider: [name] Other Clinician: Referring Provider: Treating Provider/Extender: [name] in Treatment: 38 Active Problems ICD-10 Encounter Code Description Active Date MDM Diagnosis L97.512 Non-pressure chronic ulcer of other part of right foot with fat layer exposed 09/03/2020 No Yes L97.522 Non-pressure chronic ulcer of other part of left foot with fat layer exposed 09/03/2020 No Yes Inactive Problems ICD-10 Code Description Active Date Inactive Date L89.893 Pressure ulcer of other site, stage 3 07/15/2020 07/15/2020 M62.81 Muscle weakness (generalized) 07/15/2020 07/15/2020 I10 Essential (primary) hypertension 07/15/2020 07/15/2020 M86.171 Other acute osteomyelitis, right ankle and foot 09/03/2020 09/03/2020 Resolved Problems Electronic Signature(s) Signed: 01/13/2022 10:41:41 AM By: Fredirick Maudlin MD FACS Entered By: Fredirick Maudlin on 01/13/2022 10:41:41 -------------------------------------------------------------------------------- Progress Note Details Patient Name: Date of Service: [name], [place] 01/13/2022 9:30 A M Medical Record Number: [id number] Patient Account Number: [id number] Date of Birth/Sex: Treating RN: September 25, 1975 (46 y.o. [name] Primary Care Provider: [name] Other Clinician: Referring Provider: Treating Provider/Extender: [name] in Treatment: 53 Subjective Chief Complaint Information  obtained from Patient Bilateral Plantar Foot Ulcers History of Present Illness (HPI) Wounds are12/03/2020 upon evaluation today patient presents for initial inspection here in our clinic concerning issues he has been having with the bottoms of his feet bilaterally. He states these actually occurred as wounds when he was hospitalized for 5 months secondary to Covid. He was apparently with tilting bed where he was in an upright position quite frequently and apparently this occurred in some way shape or form during that time. Fortunately there is no sign of active infection at this time. No fevers, chills, nausea, vomiting, or diarrhea. With that being said he still has substantial wounds on the plantar aspects of his feet Theragen require quite a bit of work to get these to heal. He has been using Santyl currently though that is been problematic both in receiving the medication as well as actually paid for it as it is become quite expensive. Prior to the experience with Covid the patient really did not have any major medical problems other than hypertension he does have some mild generalized weakness following the Covid experience. 07/22/2020 on evaluation today patient appears to be doing okay in regard to his foot ulcers I feel like the wound beds are showing signs of better improvement that I do believe the Iodoflex is helping in this regard. With that being said he does have a lot of drainage currently and this is somewhat blue/green in nature which is consistent with Pseudomonas. I do think a culture today would be appropriate for [place] to evaluate and see if that is indeed the case I would likely start him on antibiotic orally as well he is not allergic to Cipro knows of no issues he has had in the past 12/21; patient was admitted to the clinic earlier this month with bilateral presumed pressure ulcers on the bottom of his feet apparently related to excessive pressure from a tilt table arrangement in  the intensive care unit. Patient relates this to being on ECMO but I am not really sure that is exactly related to that. I must say I have never seen anything like this. He has fairly extensive full-thickness wounds extending from his heel towards his midfoot mostly centered laterally. There is already been some healing distally. He does not appear to have an arterial issue. He has been using gentamicin to  the wound surfaces with Iodoflex to help with ongoing debridement 1/6; this is a patient with pressure ulcers on the bottom of his feet related to excessive pressure from a standing position in the intensive care unit. He is complaining of a lot of pain in the right heel. He is not a diabetic. 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 We have been using Iodoflex to help prepare the surfaces of both wounds for an advanced treatment product. He is nonambulatory spending most of his time in a wheelchair I have asked him not to propel the wheelchair with his heels 1/13; in general his wounds look better not much surface area change we have been using Iodoflex as of last week. I did an x-ray of the right heel as the patient was complaining of pain. I had some thoughts about a stress fracture perhaps Achilles tendon problems however what it showed was erosive changes along the inferior aspect of the calcaneus he now has a MRI booked for 1/20. 1/20; in general his wounds continue to be better. Some improvement in the large narrow areas proximally in his foot. He is still complaining of pain in the right heel and tenderness in certain areas of this wound. His MRI is tonight. I am not just looking for osteomyelitis that was brought up on the x-ray I am wondering about stress fractures, tendon ruptures etc. He has no such findings on the left. Also noteworthy is that the patient had critical illness neuropathy and some of the discomfort may be actual improvement in nerve function I am  just not sure. These wounds were initially in the setting of severe critical illness related to COVID-19. He was put in a standing position. He may have also been on pressors at the point contributing to tissue ischemia. By his description at some point these wounds were grossly necrotic extending proximally up into the Achilles part of his heel. I do not know that I have ever really seen pictures of them like this although they may exist in epic We have ordered Tri layer Oasis. I am trying to stimulate some granulation in these areas. This is of course assuming the MRI is negative for infection 1/27; since the patient was last here he saw Dr. Juleen China of infectious disease. He is planned for vancomycin and ceftriaxone. Prior operative culture grew MSSA. Also ordered baseline lab work. He also ordered arterial studies although the ABIs in our clinic were normal as well as his clinical exam these were normal I do not think he needs to see vascular surgery. His ABIs at the P[facility] were 1.22 in the right triphasic waveforms with a normal TBI of 1.15 on the left ABI of 1.22 with triphasic waveforms and a normal TBI of 1.08. Finally he saw Dr. Amalia Hailey who will follow him in for 2 months. At this point I do not think he felt that he needed a procedure on the right calcaneal bone. Dr. Juleen China is elected for broad-spectrum antibiotic The patient is still having pain in the right heel. He walks with a walker 2/3; wounds are generally smaller. He is tolerating his IV antibiotics. I believe this is vancomycin and ceftriaxone. We are still waiting for Oasis burn in terms of his out-of-pocket max which he should be meeting soon given the IV antibiotics, MRIs etc. I have asked him to check in on this. We are using silver collagen in the meantime the wounds look better 2/10; tolerating IV vancomycin and Rocephin.  We are waiting to apply for Oasis. Although I am not really sure where he is in his out-of-pocket max. 2/17  started the first application of Oasis trilayer. Still on antibiotics. The wounds have generally look better. The area on the left has a little more surface slough requiring debridement 2/63; second application of Oasis trilayer. The wound surface granulation is generally look better. The area on the left with undermining laterally I think is come in a bit. 10/08/2020 upon evaluation today patient is here today for [employer] application #3. Fortunately he seems to be doing extremely well with regard to this and we are seeing a lot of new epithelial growth which is great news. Fortunately there is no signs of active infection at this time. 10/16/2020 upon evaluation today patient appears to be doing well with regard to his foot ulcers. Do believe the Oasis has been of benefit for him. I do not see any signs of infection right now which is great news and I think that he has a lot of new epithelial growth which is great to see as well. The patient is very pleased to hear all of this. I do think we can proceed with the Oasis trilayer #4 today. 3/18; not as much improvement in these areas on his heels that I was hoping. I did reapply trilateral Oasis today the tissue looks healthier but not as much fill in as I was hoping. 3/25; better looking today I think this is come in a bit the tissue looks healthier. Triple layer Oasis reapplied #6 4/1; somewhat better looking definitely better looking surface not as much change in surface area as I was hoping. He may be spending more time [place] on days then he needs to although he does have heel offloading boots. Triple layer Oasis reapplied #7 4/7; unfortunately apparently [facility] will not approve any further Oasis which is unfortunate since the patient did respond nicely both in terms of the condition of the wound bed as well as surface area. There is still some drainage coming from the wound but not a lot there does not appear to be  any infection 4/15; we have been using Hydrofera Blue. He continues to have nice rims of epithelialization on the right greater than the left. The left the epithelialization is coming from the tip of his heel. There is moderate drainage. In this that concerns me about a total contact cast. There is no evidence of infection 4/29; patient has been using Hydrofera Blue with dressing changes. He has no complaints or issues today. 5/5; using Hydrofera Blue. I actually think that he looks marginally better than the last time I saw this 3 weeks ago. There are rims of epithelialization on the left thumb coming from the medial side on the right. Using Hydrofera Blue 5/12; using Hydrofera Blue. These continue to make improvements in surface area. His drainage was not listed as severe I therefore went ahead and put a cast on the left foot. Right foot we will continue to dress his previous 5/16; back for first total contact cast change. He did not tolerate this particularly well cast injury on the anterior tibia among other issues. Difficulty sleeping. I talked him about this in some detail and afterwards is elected to continue. I told him I would like to have a cast on for 3 weeks to see if this is going to help at all. I think he agreed 5/19; I think the wound is better. There is  no tunneling towards his midfoot. The undermining medially also looks better. He has a rim of new skin distally. I think we are making progress here. The area on the left also continues to look somewhat better to me using Hydrofera Blue. He has a list of complaints about the cast but none of them seem serious 5/26; patient presents for 1 week follow-up. He has been using a total contact cast and tolerating this well. Hydrofera Blue is the main dressing used. He denies signs of infection. 6/2 Hydrofera Blue total contact cast on the left. These were large ulcers that formed in intensive care unit where the patient was recovering from  [place]. May have had something to do with being ventilated in an upright positiono Pressors etc. We have been able to get the areas down considerably and a viable surface. There is some epithelialization in both sides. Note made of drainage 6/9; changed to [facility] last time because of drainage. He arrives with better looking surfaces and dimensions on the left than the right. Paradoxically the right actually probes more towards his midfoot the left is largely close down but both of these look improved. Using a total contact cast on the left 6/16; complex wounds on his bilateral plantar heels which were initially pressure injury from a stay in the ICU with COVID. We have been using silver alginate most recently. His dimensions of come in quite dramatically however not recently. We have been putting the left foot in a total contact cast 6/23; complex wounds on the bilateral plantar heels. I been putting the left in the cast paradoxically the area on the right is the one that is going towards closure at a faster rate. Quite a bit of drainage on the left. The patient went to see Dr. Amalia Hailey who said he was going to standby for skin grafts. I had actually considered sending him for skin grafts however he would be mandatorily off his feet for a period of weeks to months. I am thinking that the area on the right is going to close on its own the area on the left has been more stubborn even though we have him in a total contact cast 6/30; took him out of a total contact cast last week is the right heel seem to be making better progress than the left where I was placing the cast. We are using silver alginate. Both wounds are smaller right greater than left 7/12; both wounds look as though they are making some progress. We are using silver alginate. Heel offloading boots 7/26; very gradual progress especially on the right. Using silver alginate. He is wearing heel offloading boots 8/18; he continues to  close these wounds down very gradually. Using silver alginate. The problem polymen being definitive about this is areas of what appears to be callus around the margins. This is not a 100% of the area but certainly sizable especially on the right 9/1; bilateral plantar feet wounds secondary to prolonged pressure while being ventilated for COVID-19 in an upright position. Essentially pressure ulcers on the bottom of his feet. He is made substantial progress using silver alginate. 9/14; bilateral plantar feet wounds secondary to prolonged pressure. Making progress using silver alginate. 9/29 bilateral plantar feet wounds secondary to prolonged pressure. I changed him to Iodoflex last week. MolecuLight showing reddened blush fluorescence 10/11; patient presents for follow-up. He has no issues or complaints today. He denies signs of infection. He continues to use Iodoflex and antibiotic ointment to the  wound beds. 10/27; 2-week follow-up. No evidence of infection. He has callus and thick dry skin around the wound margins we have been using Iodoflex and Bactroban which was in response to a moderate left MolecuLight reddish blush fluorescence. 11/10; 2-week follow-up. Wound margins again have thick callus however the measurements of the actual wound sites are a lot smaller. Everything looks reasonably healthy here. We have been using Iodoflex He was approved for prime matrix but I have elected to delay this given the improvement in the surface area. Hopefully I will not regret that decision as were getting close to the end of the year in terms of insurance payment 12/8; 2-week follow-up. Wounds are generally smaller in size. These were initially substantial wounds extending into the forefoot all the way into the heel on the bilateral plantar feet. They are now both located on the plantar heel distal aspect both of these have a lot of callus around the wounds I used a #5 curette to remove this on the right  and the left also some subcutaneous debris to try and get the wound edges were using Iodoflex. He has heel offloading shoe 12/22; 2-week follow-up. Not really much improvement. He has thick callus around the outer edges of both wounds. I remove this there is some nonviable subcutaneous tissue as well. We have been using Iodoflex. Her intake nurse and myself spontaneously thought of a total contact cast I went back in May. At that time we really were not seeing much of an improvement with a cast although the wound was in a much different situation I would like to retry this in 2 weeks and I discussed this with the patient 08/12/2021; the patient has had some improvement with the Iodoflex. The the area on the left heel plantar more improved than the right. I had to put him in a total contact cast on the left although I decided to put that off for 2 weeks. I am going to change his primary dressing to silver collagen. I think in both areas he has had some improvement most of the healing seems to be more proximal in the heel. The wounds are in the mid aspect. A lot of thick callus on the right heel however. 1/19; we are using silver collagen on both plantar heel areas. He has had some improvement today. The left did not require any debridement. He still had some eschar on the right that was debrided but both seem to have contracted. I did not put it total contact cast on him today 2/2 we have been using silver collagen. The area on the right plantar heel has areas that appear to be epithelialized interspersed with dry flaking callus and dry skin. I removed this. This really looks better than on the other side. On the left still a large area with raised edges and debris on the surface. 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 He has no issues or complaints today. 2/9; not much  change in the left foot wound with 1 week of a cast we are using silver collagen. Silver collagen on the right side. The right side has been the better wound surface. We will reapply the total contact cast on the left 2/16; not much improvement on either side I been using silver collagen with a total contact cast on the left. I'm changing the Hydrofera  Blue still with a total contact cast on the left 2/23; some improvement on both sides. Disappointing that he has thick callus around the area that we are putting in a total contact cast on the left. We've been using Hydrofera Blue on both wound areas. This is a man who at essentially pressure ulcers in addition to ischemia caused by medications to support his blood pressure (pressors) in the ICU. He was being ventilated in the standing position for severe Covid. A [place] the wounds extended across his entire foot but are now localized to his plantar heels bilaterally. We have made progress however neither areas healed. I continue to think the total contact cast is helped albeit painstakingly slowly. He has never wanted a plastic surgery consult although I don't know that they would be interested in grafting in area in this location. 10/07/2021: Continued improvement bilaterally. There is still some callus around the left wound, despite the total contact cast. He has some increased pain in his right midfoot around 1 particular area. This has been painful in the past but seems to be a little bit worse. When his cast was removed today, there was an area on the heel of the left foot that looks a bit macerated. He is also complaining of pain in his left thigh and hip which he thinks is secondary to the limb length discrepancy caused by the cast. 10/14/2021: He continues to improve. A little bit less callus around the left wound. He continues to endorse pain in his right midfoot, but this is not as significant as it was last week. The maceration on his left heel  is improved. 10/21/2021: Continued improvement to both wounds. The maceration on his left heel is no longer evident. Less callus bilaterally. Epithelialization progressing. 10/28/2021: Significant improvement this week. The right sided wound is nearly closed with just a small open area at the middle. No maceration seen on the left heel. Continued epithelialization on both sides. No concern for infection. 11/04/2021: T oday, the wounds were measured a little bit differently and come out as larger, but I actually think they are about the same to potentially even smaller, particularly on the left. He continues to accumulate some callus on the right. 11/11/2021: T oday, the patient is expressing some concern that the left wound, despite being in the total contact cast, is not progressing at the same rate as the 1 on the right. He is interested in trying a week without the cast to see how the wound does. The wounds are roughly the same size as last week, with the right perhaps being a little bit smaller. He continues to build up callus on both sites. 11/18/2021: Last week, I permitted the patient to go without his total contact cast, just to see if the cast was really making any difference. Today, both wounds have deteriorated to some extent, suggesting that the cast is providing benefit, at least on the left. Both are larger and have accumulated callus, slough, and other debris. 11/26/2021: I debrided both wounds quite aggressively last week in an effort to stimulate the healing cascade. This appears to have been effective as the left sided wound is a full centimeter shorter in length. Although the right was measured slightly larger, on inspection, it looks as though an area of epithelialized tissue was included in the measurements. We have been using PolyMem Ag on the wound surfaces with a total contact cast to the left. 12/02/2021: It appears that the intake personnel are including  epithelialized tissue in his  wound measurements; the right wound is almost completely epithelialized; there is just a crater at the proximal midfoot with some open areas. On the left, he has built up some callus, but the overall wound surface looks good. There is some senescent skin around the wound margin. He has been in PolyMem Ag bilaterally with a total contact cast on the left. 12/09/2021: The right wound is nearly closed; there is just a small open area at the mid calcaneus. On the left, the wound is smaller with minimal callus buildup. No significant drainage. 12/16/2021: The right calcaneal wound remains minimally open at the mid calcaneus; the rest has epithelialized. On the left, the wound is also a little bit smaller. There is some senescent tissue on the periphery. He is getting his first application of a trial skin substitute called Vendaje today. 12/23/2021: The wound on his right calcaneus is nearly closed; there is just a small area at the most distal aspect of the calcaneus that is open. On the left, the area where we applied to the skin substitute has a healthier-looking bed of granulation tissue. The wound dimensions are not significantly different on this side but the wound surface is improved. 12/30/2021: The wound on the right calcaneus has not changed significantly aside from some accumulation of callus. On the left, the open area is smaller and continues to have an improved surface. He continues to accumulate callus around the wound. He is here for his third application of Vendaje. 01/06/2022: The right calcaneal wound is down to just a couple of millimeters. He continues to accumulate periwound callus. He unfortunately got his cast wet earlier in the week and his left foot is macerated, resulting in some superficial skin loss just distal to the open wound. The open wound itself, however, is much smaller and has a healthier appearing surface. He is here for his fourth application of Vendaje. 01/13/2022: The right  calcaneal wound is about the same. Unfortunately, once again, his cast got wet and his foot is again macerated. This is caused the left calcaneal wound to enlarge. He is here for his fifth application of Vendaje. Patient History Information obtained from Patient. Family History Cancer - Maternal Grandparents, Diabetes - Father,Paternal Grandparents, Heart Disease - Maternal Grandparents, Hypertension - Father,Paternal Grandparents, Lung Disease - Siblings, No family history of Hereditary Spherocytosis, Kidney Disease, Seizures, Stroke, Thyroid Problems, Tuberculosis. Social History Never smoker, Marital Status - Married, Alcohol Use - Never, Drug Use - No History, Caffeine Use - Daily - tea, soda. Medical History Eyes Denies history of Cataracts, Glaucoma, Optic Neuritis Ear/Nose/Mouth/Throat Denies history of Chronic sinus problems/congestion, Middle ear problems Hematologic/Lymphatic Denies history of Anemia, Hemophilia, Human Immunodeficiency Virus, Lymphedema, Sickle Cell Disease Respiratory Patient has history of Asthma Denies history of Aspiration, Chronic Obstructive Pulmonary Disease (COPD), Pneumothorax, Sleep Apnea, Tuberculosis Cardiovascular Patient has history of Angina - with COVID, Hypertension Denies history of Arrhythmia, Congestive Heart Failure, Coronary Artery Disease, Deep Vein Thrombosis, Hypotension, Myocardial Infarction, Peripheral Arterial Disease, Peripheral Venous Disease, Phlebitis, Vasculitis Gastrointestinal Denies history of Cirrhosis , Colitis, Crohnoos, Hepatitis A, Hepatitis B, Hepatitis C Endocrine Denies history of Type I Diabetes, Type II Diabetes Genitourinary Denies history of End Stage Renal Disease Immunological Denies history of Lupus Erythematosus, Raynaudoos, Scleroderma Integumentary (Skin) Denies history of History of Burn Musculoskeletal Denies history of Gout, Rheumatoid Arthritis, Osteoarthritis, Osteomyelitis Neurologic Denies  history of Dementia, Neuropathy, Quadriplegia, Paraplegia, Seizure Disorder Oncologic Denies history of Received Chemotherapy, Received Radiation Psychiatric Denies  history of Anorexia/bulimia, Confinement Anxiety Hospitalization/Surgery History - COVID PNA 07/22/2019- 11/14/2019. - 03/27/2020 wound debridement/ skin graft. Medical A Surgical History Notes nd Constitutional Symptoms (General Health) COVID PNA 07/22/2019-11/14/2019 [facility] ECMO, foot drop left foot , Genitourinary kidney stone Psychiatric anxiety Objective Constitutional Slightly hypertensive. No acute distress.. Vitals Time Taken: 9:55 AM, Height: 69 in, Weight: 280 lbs, BMI: 41.3, Temperature: 98.0 F, Pulse: 80 bpm, Respiratory Rate: 18 breaths/min, Blood Pressure: 140/75 mmHg. Respiratory Normal work of breathing on room air.. General Notes: 01/13/2022: The right calcaneal wound is basically unchanged with the typical periwound callus accumulation. On the left, the foot became macerated again during the week and this has caused the ulcer to enlarge. The surface, however, is clean and healthy-appearing. Integumentary (Hair, Skin) Wound #1 status is Open. Original cause of wound was Pressure Injury. The date acquired was: 10/07/2019. The wound has been in treatment 78 weeks. The wound is located on the Right Calcaneus. The wound measures 0.9cm length x 0.3cm width x 0.2cm depth; 0.212cm^2 area and 0.042cm^3 volume. There is Fat Layer (Subcutaneous Tissue) exposed. There is no tunneling or undermining noted. There is a medium amount of serosanguineous drainage noted. The wound margin is thickened. There is large (67-100%) red, pink granulation within the wound bed. There is a small (1-33%) amount of necrotic tissue within the wound bed including Eschar and Adherent Slough. Wound #2 status is Open. Original cause of wound was Pressure Injury. The date acquired was: 10/07/2019. The wound has been in treatment 78 weeks. The wound  is located on the Left Calcaneus. The wound measures 2.1cm length x 1.4cm width x 0.2cm depth; 2.309cm^2 area and 0.462cm^3 volume. There is Fat Layer (Subcutaneous Tissue) exposed. There is no tunneling or undermining noted. There is a medium amount of serosanguineous drainage noted. The wound margin is distinct with the outline attached to the wound base. There is medium (34-66%) pink, pale granulation within the wound bed. There is a medium (34- 66%) amount of necrotic tissue within the wound bed including Adherent Slough. Wound #4 status is Open. Original cause of wound was Other Lesion. The date acquired was: 12/10/2021. The wound has been in treatment 1 weeks. The wound is located on the [place]. The wound measures 0cm length x 0cm width x 0cm depth; 0cm^2 area and 0cm^3 volume. There is no tunneling or undermining noted. There is a none present amount of drainage noted. There is no granulation within the wound bed. There is no necrotic tissue within the wound bed. Assessment Active Problems ICD-10 Non-pressure chronic ulcer of other part of right foot with fat layer exposed Non-pressure chronic ulcer of other part of left foot with fat layer exposed Procedures Wound #1 Pre-procedure diagnosis of Wound #1 is a Pressure Ulcer located on the Right Calcaneus . There was a Excisional Skin/Subcutaneous Tissue Debridement with a total area of 0.27 sq cm performed by Fredirick Maudlin, MD. With the following instrument(s): Curette to remove Non-Viable tissue/material. Material removed includes Subcutaneous Tissue and Slough and after achieving pain control using Other (benzocaine 20%). No specimens were taken. A time out was conducted at 10:21, prior to the start of the procedure. A Minimum amount of bleeding was controlled with Pressure. The procedure was tolerated well with a pain level of 0 throughout and a pain level of 0 following the procedure. Post Debridement Measurements: 0.9cm  length x 0.3cm width x 0.2cm depth; 0.042cm^3 volume. Post debridement Stage noted as Category/Stage III. Character of Wound/Ulcer Post Debridement is  improved. Post procedure Diagnosis Wound #1: Same as Pre-Procedure Wound #2 Pre-procedure diagnosis of Wound #2 is a Pressure Ulcer located on the Left Calcaneus . There was a Excisional Skin/Subcutaneous Tissue Debridement with a total area of 2.94 sq cm performed by Fredirick Maudlin, MD. With the following instrument(s): Curette to remove Non-Viable tissue/material. Material removed includes Subcutaneous Tissue and Slough and after achieving pain control using Other (benzocaine 20%). No specimens were taken. A time out was conducted at 10:21, prior to the start of the procedure. A Minimum amount of bleeding was controlled with Pressure. The procedure was tolerated well with a pain level of 0 throughout and a pain level of 0 following the procedure. Post Debridement Measurements: 2.1cm length x 1.4cm width x 0.2cm depth; 0.462cm^3 volume. Post debridement Stage noted as Category/Stage III. Character of Wound/Ulcer Post Debridement is improved. Post procedure Diagnosis Wound #2: Same as Pre-Procedure Plan 01/13/2022: The right calcaneal wound is basically unchanged with the typical periwound callus accumulation. On the left, the foot became macerated again during the week and this has caused the ulcer to enlarge. The surface, however, is clean and healthy-appearing. I used a curette to debride periwound callus from both wounds, as well as some surface slough from the left. I then applied Vendaje in standard fashion. It was secured with Adaptic and Steri-Strips with a gauze bolster. We will reapply his total contact cast. He was encouraged to obtain a new cast protector or sponge bathe to avoid further episodes of his cast getting wet. Follow-up in 1 week. Electronic Signature(s) Signed: 01/13/2022 10:47:52 AM By: Fredirick Maudlin MD FACS Entered By:  Fredirick Maudlin on 01/13/2022 10:47:52 -------------------------------------------------------------------------------- HxROS Details Patient Name: Date of Service: [name], [place] 01/13/2022 9:30 A M Medical Record Number: [id number] Patient Account Number: [id number] Date of Birth/Sex: Treating RN: Aug 19, 1975 (46 y.o. [name] Primary Care Provider: [name] Other Clinician: Referring Provider: Treating Provider/Extender: [name] in Treatment: 85 Information Obtained From Patient Constitutional Symptoms (General Health) Medical History: Past Medical History Notes: COVID PNA 07/22/2019-11/14/2019 [facility] ECMO, foot drop left foot , Eyes Medical History: Negative for: Cataracts; Glaucoma; Optic Neuritis Ear/Nose/Mouth/Throat Medical History: Negative for: Chronic sinus problems/congestion; Middle ear problems Hematologic/Lymphatic Medical History: Negative for: Anemia; Hemophilia; Human Immunodeficiency Virus; Lymphedema; Sickle Cell Disease Respiratory Medical History: Positive for: Asthma Negative for: Aspiration; Chronic Obstructive Pulmonary Disease (COPD); Pneumothorax; Sleep Apnea; Tuberculosis Cardiovascular Medical History: Positive for: Angina - with COVID; Hypertension Negative for: Arrhythmia; Congestive Heart Failure; Coronary Artery Disease; Deep Vein Thrombosis; Hypotension; Myocardial Infarction; Peripheral Arterial Disease; Peripheral Venous Disease; Phlebitis; Vasculitis Gastrointestinal Medical History: Negative for: Cirrhosis ; Colitis; Crohns; Hepatitis A; Hepatitis B; Hepatitis C Endocrine Medical History: Negative for: Type I Diabetes; Type II Diabetes Genitourinary Medical History: Negative for: End Stage Renal Disease Past Medical History Notes: kidney stone Immunological Medical History: Negative for: Lupus Erythematosus; Raynauds; Scleroderma Integumentary (Skin) Medical History: Negative for:  History of Burn Musculoskeletal Medical History: Negative for: Gout; Rheumatoid Arthritis; Osteoarthritis; Osteomyelitis Neurologic Medical History: Negative for: Dementia; Neuropathy; Quadriplegia; Paraplegia; Seizure Disorder Oncologic Medical History: Negative for: Received Chemotherapy; Received Radiation Psychiatric Medical History: Negative for: Anorexia/bulimia; Confinement Anxiety Past Medical History Notes: anxiety Immunizations Pneumococcal Vaccine: Received Pneumococcal Vaccination: No Implantable Devices None Hospitalization / Surgery History Type of Hospitalization/Surgery COVID PNA 07/22/2019- 11/14/2019 03/27/2020 wound debridement/ skin graft Family and Social History Cancer: Yes - Maternal Grandparents; Diabetes: Yes - Father,Paternal Grandparents; Heart Disease: Yes - Maternal Grandparents; Hereditary Spherocytosis: No;  Hypertension: Yes - Father,Paternal Grandparents; Kidney Disease: No; Lung Disease: Yes - Siblings; Seizures: No; Stroke: No; Thyroid Problems: No; Tuberculosis: No; Never smoker; Marital Status - Married; Alcohol Use: Never; Drug Use: No History; Caffeine Use: Daily - tea, soda; Financial Concerns: No; Food, Clothing or Shelter Needs: No; Support System Lacking: No; Transportation Concerns: No Engineer, maintenance) Signed: 01/13/2022 12:34:21 PM By: Fredirick Maudlin MD FACS Signed: 01/13/2022 7:01:21 PM By: [name] Entered By: Fredirick Maudlin on 01/13/2022 10:45:03 -------------------------------------------------------------------------------- Total Contact Cast Details Patient Name: Date of Service: [name], [employer] 01/13/2022 9:30 A M Medical Record Number: [id number] Patient Account Number: [id number] Date of Birth/Sex: Treating RN: [birth year] (46 y.o. [name] Primary Care Provider: [name] Other Clinician: Referring Provider: Treating Provider/Extender: [name] in Treatment: 67 T  Contact Cast Applied for Wound Assessment: otal Wound #1 Right Calcaneus Performed By: Physician Fredirick Maudlin, MD Post Procedure Diagnosis Same as Pre-procedure Electronic Signature(s) Signed: 01/13/2022 12:34:21 PM By: Fredirick Maudlin MD FACS Signed: 01/13/2022 7:01:21 PM By: [name] Entered By: [name] on 01/13/2022 11:12:06 -------------------------------------------------------------------------------- [place] Details Patient Name: Date of Service: [name], [place] 01/13/2022 Medical Record Number: [id number] Patient Account Number: [id number] Date of Birth/Sex: Treating RN: November 04, 1975 (46 y.o. [name] Primary Care Provider: [name] Other Clinician: Referring Provider: Treating Provider/Extender: [name] in Treatment: 78 Diagnosis Coding ICD-10 Codes Code Description [phone] Non-pressure chronic ulcer of other part of right foot with fat layer exposed L97.522 Non-pressure chronic ulcer of other part of left foot with fat layer exposed Facility Procedures CPT4 Code: [id number] Description: [place] - DEB SUBQ TISSUE 20 SQ CM/< ICD-10 Diagnosis Description L97.512 Non-pressure chronic ulcer of other part of right foot with fat layer exposed L97.522 Non-pressure chronic ulcer of other part of left foot with fat layer exposed Modifier: Quantity: 1 Physician Procedures : CPT4 Code Description Modifier [id number] - WC PHYS LEVEL 3 - EST PT 25 ICD-10 Diagnosis Description L97.512 Non-pressure chronic ulcer of other part of right foot with fat layer exposed L97.522 Non-pressure chronic ulcer of other part of left foot  with fat layer exposed Quantity: 1 : [id number] - WC PHYS SUBQ TISS 20 SQ CM ICD-10 Diagnosis Description L97.512 Non-pressure chronic ulcer of other part of right foot with fat layer exposed L97.522 Non-pressure chronic ulcer of other part of left foot with fat layer exposed Quantity: 1 Electronic  Signature(s) Signed: 01/13/2022 10:48:10 AM By: Fredirick Maudlin MD FACS Entered By: Fredirick Maudlin on 01/13/2022 10:48:09

## 2022-01-20 ENCOUNTER — Encounter (HOSPITAL_BASED_OUTPATIENT_CLINIC_OR_DEPARTMENT_OTHER): Payer: PPO | Admitting: General Surgery

## 2022-01-20 DIAGNOSIS — L97512 Non-pressure chronic ulcer of other part of right foot with fat layer exposed: Secondary | ICD-10-CM | POA: Diagnosis not present

## 2022-01-20 NOTE — Progress Notes (Signed)
[name], [name] ([id number]) Visit Report for 01/20/2022 Arrival Information Details Patient Name: Date of Service: [name], [name] 01/20/2022 9:30 A M Medical Record Number: [id number] Patient Account Number: [id number] Date of Birth/Sex: Treating RN: 1976/04/02 (46 y.o. [name] Primary Care Emiliano Welshans: [name] Other Clinician: Referring Raaga Maeder: Treating Ovide Dusek/Extender: [name] in Treatment: 27 Visit Information History Since Last Visit Added or deleted any medications: No Patient Arrived: Wheel Chair Any new allergies or adverse reactions: No Arrival Time: 09:40 Had a fall or experienced change in No Accompanied By: spouse activities of daily living that may affect Transfer Assistance: None risk of falls: Patient Requires Transmission-Based Precautions: No Signs or symptoms of abuse/neglect since last visito No Patient Has Alerts: No Hospitalized since last visit: No Implantable device outside of the clinic excluding No cellular tissue based products placed in the center since last visit: Has Dressing in Place as Prescribed: Yes Pain Present Now: Yes Electronic Signature(s) Signed: 01/20/2022 6:39:17 PM By: [name] Entered By: [name] on 01/20/2022 09:53:56 -------------------------------------------------------------------------------- Encounter Discharge Information Details Patient Name: Date of Service: [name], [place]. 01/20/2022 9:30 A M Medical Record Number: [id number] Patient Account Number: [id number] Date of Birth/Sex: Treating RN: [birth year] (46 y.o. [name] Primary Care [name]: [name] Other Clinician: Referring [name]: Treating [name]/Extender: [name] in Treatment: 24 Encounter Discharge Information Items Post Procedure Vitals Discharge Condition: Stable Temperature (F): 98.5 Ambulatory Status: Wheelchair Pulse (bpm): 80 Discharge  Destination: Home Respiratory Rate (breaths/min): 18 Transportation: Private Auto Blood Pressure (mmHg): 118/83 Accompanied By: son and spouse Schedule Follow-up Appointment: Yes Clinical Summary of Care: Patient Declined Electronic Signature(s) Signed: 01/20/2022 6:39:17 PM By: [name] Entered By: [name] on 01/20/2022 18:28:28 -------------------------------------------------------------------------------- Lower Extremity Assessment Details Patient Name: Date of Service: [name], [place] 01/20/2022 9:30 A M Medical Record Number: [id number] Patient Account Number: [id number] Date of Birth/Sex: Treating RN: 1976/04/16 (46 y.o. [name] Primary Care Rosselyn Martha: [name] Other Clinician: Referring [name]: Treating Cecilee Rosner/Extender: [name] in Treatment: 79 Edema Assessment Assessed: [name]: No] [name]: No] Edema: [Left: No] [Right: No] Calf Left: Right: Point of Measurement: 29 cm From Medial Instep 41 cm 43 cm Ankle Left: Right: Point of Measurement: 9 cm From Medial Instep 23.5 cm 25 cm Electronic Signature(s) Signed: 01/20/2022 6:39:17 PM By: [name] Entered By: [name] on 01/20/2022 10:27:09 -------------------------------------------------------------------------------- Multi Wound Chart Details Patient Name: Date of Service: [name], [place] 01/20/2022 9:30 A M Medical Record Number: [id number] Patient Account Number: [id number] Date of Birth/Sex: Treating RN: August 28, 1975 (46 y.o. [name] Primary Care [name]: [name] Other Clinician: Referring [name]: Treating [name]/Extender: [name] in Treatment: 73 Vital Signs Height(in): 69 Pulse(bpm): 80 Weight(lbs): 280 Blood Pressure(mmHg): 118/83 Body Mass Index(BMI): 41.3 Temperature(F): 98.5 Respiratory Rate(breaths/min): 18 Photos: [N/A:N/A] Right Calcaneus Left Calcaneus N/A Wound  Location: Pressure Injury Pressure Injury N/A Wounding Event: Pressure Ulcer Pressure Ulcer N/A Primary Etiology: Asthma, Angina, Hypertension Asthma, Angina, Hypertension N/A Comorbid History: 10/07/2019 10/07/2019 N/A Date Acquired: 79 79 N/A Weeks of Treatment: Open Open N/A Wound Status: No No N/A Wound Recurrence: 0.5x0.2x0.2 2x0.5x0.2 N/A Measurements L x W x D (cm) 0.079 0.785 N/A A (cm) : rea 0.016 0.157 N/A Volume (cm) : 99.80% 97.10% N/A % Reduction in A rea: 99.90% 99.40% N/A % Reduction in Volume: Category/Stage III Category/Stage III N/A Classification: Medium Medium N/A  Exudate A mount: Serosanguineous Serosanguineous N/A Exudate Type: red, brown red, brown N/A Exudate Color: Thickened Distinct, outline attached N/A Wound Margin: Large (67-100%) Small (1-33%) N/A Granulation A mount: Red, Pink Pink, Pale N/A Granulation Quality: Small (1-33%) Large (67-100%) N/A Necrotic A mount: Eschar, Adherent Slough Adherent Slough N/A Necrotic Tissue: Fat Layer (Subcutaneous Tissue): Yes Fat Layer (Subcutaneous Tissue): Yes N/A Exposed Structures: Fascia: No Fascia: No Tendon: No Tendon: No Muscle: No Muscle: No Joint: No Joint: No Bone: No Bone: No Medium (34-66%) Medium (34-66%) N/A Epithelialization: Debridement - Selective/Open Wound Debridement - Excisional N/A Debridement: Pre-procedure Verification/Time Out 10:26 10:26 N/A Taken: Lidocaine 4% T opical Solution Lidocaine 4% Topical Solution N/A Pain Control: Callus, Slough Callus, Subcutaneous, Slough N/A Tissue Debrided: Non-Viable Tissue Skin/Subcutaneous Tissue N/A Level: 0.1 1 N/A Debridement A (sq cm): rea Curette Curette N/A Instrument: Minimum Minimum N/A Bleeding: Pressure Pressure N/A Hemostasis A chieved: 0 0 N/A Procedural Pain: 0 0 N/A Post Procedural Pain: Procedure was tolerated well Procedure was tolerated well N/A Debridement Treatment Response: 0.5x0.2x0.2  2x0.5x0.2 N/A Post Debridement Measurements L x W x D (cm) 0.016 0.157 N/A Post Debridement Volume: (cm) Category/Stage III Category/Stage III N/A Post Debridement Stage: Debridement Debridement N/A Procedures Performed: Treatment Notes Electronic Signature(s) Signed: 01/20/2022 10:52:41 AM By: Fredirick Maudlin MD FACS Signed: 01/20/2022 6:39:17 PM By: [name] Entered By: Fredirick Maudlin on 01/20/2022 10:52:41 -------------------------------------------------------------------------------- Multi-Disciplinary Care Plan Details Patient Name: Date of Service: [name], [name] 01/20/2022 9:30 A M Medical Record Number: [id number] Patient Account Number: [id number] Date of Birth/Sex: Treating RN: 05-07-1976 (46 y.o. [name] Primary Care Devanny Palecek: [name] Other Clinician: Referring Glendell Fouse: Treating Sherrilynn Gudgel/Extender: [name] in Treatment: 14 [place] reviewed with physician Active Inactive Wound/Skin Impairment Nursing Diagnoses: Impaired tissue integrity Knowledge deficit related to ulceration/compromised skin integrity Goals: Patient/caregiver will verbalize understanding of skin care regimen Date Initiated: 07/15/2020 Target Resolution Date: 02/04/2022 Goal Status: Active Ulcer/skin breakdown will have a volume reduction of 30% by week 4 Date Initiated: 07/15/2020 Date Inactivated: 08/20/2020 Target Resolution Date: 09/03/2020 Goal Status: Unmet Unmet Reason: no major changes. Ulcer/skin breakdown will heal within 14 weeks Date Initiated: 12/04/2020 Date Inactivated: 12/10/2020 Target Resolution Date: 12/10/2020 Unmet Reason: wounds still open at 14 Goal Status: Unmet weeks and today 21 weeks. Interventions: Assess patient/caregiver ability to obtain necessary supplies Assess patient/caregiver ability to perform ulcer/skin care regimen upon admission and as needed Assess ulceration(s) every  visit Provide education on ulcer and skin care Treatment Activities: Skin care regimen initiated : 07/15/2020 Topical wound management initiated : 07/15/2020 Notes: Electronic Signature(s) Signed: 01/20/2022 6:39:17 PM By: [name] Entered By: [name] on 01/20/2022 18:25:52 -------------------------------------------------------------------------------- Pain Assessment Details Patient Name: Date of Service: [name], [place] 01/20/2022 9:30 A M Medical Record Number: [id number] Patient Account Number: [id number] Date of Birth/Sex: Treating RN: September 12, 1975 (46 y.o. [name] Primary Care Langston [place]: [name] Other Clinician: Referring Cynde Menard: Treating Kaylor Maiers/Extender: [name] in Treatment: 61 Active Problems Location of Pain Severity and Description of Pain Patient Has Paino Yes Site Locations Pain Location: Generalized Pain With Dressing Change: No Duration of the Pain. Constant / Intermittento Constant Rate the pain. Current Pain Level: 6 Worst Pain Level: 10 Least Pain Level: 5 Tolerable Pain Level: 5 Character of Pain Describe the Pain: Difficult to Pinpoint Pain Management and Medication Current Pain Management: Medication: No Cold Application: No Rest: No Massage: No Activity: No T.E.N.S.:  No Heat Application: No Leg drop or elevation: No Is the Current Pain Management Adequate: Inadequate How does your wound impact your activities of daily livingo Sleep: No Bathing: No Appetite: No Relationship With Others: No Bladder Continence: No Emotions: No Bowel Continence: No Work: No Toileting: No Drive: No Dressing: No Hobbies: No Electronic Signature(s) Signed: 01/20/2022 6:39:17 PM By: [name] Entered By: [name] on 01/20/2022 09:56:20 -------------------------------------------------------------------------------- Patient/Caregiver Education Details Patient Name: Date of  Service: Ciro Backer 6/15/2023andnbsp9:30 [place] Record Number: [id number] Patient Account Number: [id number] Date of Birth/Gender: Treating RN: 10-Apr-1976 (46 y.o. [name] Primary Care Physician: [name] Other Clinician: Referring Physician: Treating Physician/Extender: [name] in Treatment: 19 Education Assessment Education Provided To: Patient Education Topics Provided Wound/Skin Impairment: Methods: Explain/Verbal Responses: Return demonstration correctly Electronic Signature(s) Signed: 01/20/2022 6:39:17 PM By: [name] Entered By: [name] on 01/20/2022 18:26:09 -------------------------------------------------------------------------------- Wound Assessment Details Patient Name: Date of Service: [name], [place] 01/20/2022 9:30 A M Medical Record Number: [id number] Patient Account Number: [id number] Date of Birth/Sex: Treating RN: [birth year] (46 y.o. [name] Primary Care Ilyana Manuele: [name] Other Clinician: Referring Tehilla Coffel: Treating Doyal Saric/Extender: [name] in Treatment: 79 Wound Status Wound Number: 1 Primary Etiology: Pressure Ulcer Wound Location: Right Calcaneus Wound Status: Open Wounding Event: Pressure Injury Comorbid History: Asthma, Angina, Hypertension Date Acquired: 10/07/2019 Weeks Of Treatment: 79 Clustered Wound: No Photos Wound Measurements Length: (cm) 0.5 Width: (cm) 0.2 Depth: (cm) 0.2 Area: (cm) 0.079 Volume: (cm) 0.016 % Reduction in Area: 99.8% % Reduction in Volume: 99.9% Epithelialization: Medium (34-66%) Tunneling: No Undermining: No Wound Description Classification: Category/Stage III Wound Margin: Thickened Exudate Amount: Medium Exudate Type: Serosanguineous Exudate Color: red, brown Foul Odor After Cleansing: No Slough/Fibrino Yes Wound Bed Granulation Amount: Large (67-100%) Exposed  Structure Granulation Quality: Red, Pink Fascia Exposed: No Necrotic Amount: Small (1-33%) Fat Layer (Subcutaneous Tissue) Exposed: Yes Necrotic Quality: Eschar, Adherent Slough Tendon Exposed: No Muscle Exposed: No Joint Exposed: No Bone Exposed: No Treatment Notes Wound #1 (Calcaneus) Wound Laterality: Right Cleanser Normal Saline Discharge Instruction: Cleanse the wound with Normal Saline prior to applying a clean dressing using gauze sponges, not tissue or cotton balls. Wound Cleanser Discharge Instruction: Cleanse the wound with wound cleanser prior to applying a clean dressing using gauze sponges, not tissue or cotton balls. Peri-Wound Care Topical Primary Dressing PolyMem Silver Non-Adhesive Dressing, 4.25x4.25 in Discharge Instruction: Apply to wound bed as instructed Secondary Dressing Zetuvit Plus 4x8 in Discharge Instruction: Apply over primary dressing as directed. Secured With Elastic Bandage 4 inch (ACE bandage) Discharge Instruction: Secure with ACE bandage as directed. Kerlix Roll Sterile, 4.5x3.1 (in/yd) Discharge Instruction: Secure with Kerlix as directed. Compression Wrap Compression Stockings Add-Ons Electronic Signature(s) Signed: 01/20/2022 6:39:17 PM By: [name] Entered By: [name] on 01/20/2022 10:15:26 -------------------------------------------------------------------------------- Wound Assessment Details Patient Name: Date of Service: [name], [name] 01/20/2022 9:30 A M Medical Record Number: [id number] Patient Account Number: [id number] Date of Birth/Sex: Treating RN: 1976/06/26 (46 y.o. [name] Primary Care Amedio Bowlby: [name] Other Clinician: Referring Gordy Goar: Treating Hosanna Betley/Extender: [name] in Treatment: 79 Wound Status Wound Number: 2 Primary Etiology: Pressure Ulcer Wound Location: Left Calcaneus Wound Status: Open Wounding Event: Pressure Injury Comorbid History:  Asthma, Angina, Hypertension Date Acquired: 10/07/2019 Weeks Of Treatment: 79 Clustered Wound: No Photos Wound Measurements Length: (cm) 2 Width: (cm) 0.5 Depth: (cm) 0.2 Area: (  cm) 0.785 Volume: (cm) 0.157 % Reduction in Area: 97.1% % Reduction in Volume: 99.4% Epithelialization: Medium (34-66%) Tunneling: No Undermining: No Wound Description Classification: Category/Stage III Wound Margin: Distinct, outline attached Exudate Amount: Medium Exudate Type: Serosanguineous Exudate Color: red, brown Foul Odor After Cleansing: No Slough/Fibrino Yes Wound Bed Granulation Amount: Small (1-33%) Exposed Structure Granulation Quality: Pink, Pale Fascia Exposed: No Necrotic Amount: Large (67-100%) Fat Layer (Subcutaneous Tissue) Exposed: Yes Necrotic Quality: Adherent Slough Tendon Exposed: No Muscle Exposed: No Joint Exposed: No Bone Exposed: No Treatment Notes Wound #2 (Calcaneus) Wound Laterality: Left Cleanser Normal Saline Discharge Instruction: Cleanse the wound with Normal Saline prior to applying a clean dressing using gauze sponges, not tissue or cotton balls. Wound Cleanser Discharge Instruction: Cleanse the wound with wound cleanser prior to applying a clean dressing using gauze sponges, not tissue or cotton balls. Peri-Wound Care Zinc Oxide Ointment 30g tube Discharge Instruction: Apply Zinc Oxide to periwound with each dressing change Topical Primary Dressing Vendaje Secondary Dressing Optifoam Non-Adhesive Dressing, 4x4 in Discharge Instruction: Apply over primary dressing as directed. Zetuvit Plus 4x8 in Discharge Instruction: Apply over primary dressing as directed. Secured With [employer], 4.5x3.1 (in/yd) Discharge Instruction: Secure with Kerlix as directed. 57M Medipore H Soft Cloth Surgical T ape, 4 x 10 (in/yd) Discharge Instruction: Secure with tape as directed. Compression Wrap Compression Stockings Add-Ons Electronic  Signature(s) Signed: 01/20/2022 6:39:17 PM By: [name] Entered By: [name] on 01/20/2022 10:16:11 -------------------------------------------------------------------------------- Vitals Details Patient Name: Date of Service: [name], [place] 01/20/2022 9:30 A M Medical Record Number: [id number] Patient Account Number: [id number] Date of Birth/Sex: Treating RN: 1975/11/10 (46 y.o. [name] Primary Care Keriann Rankin: [name] Other Clinician: Referring Leani Myron: Treating Kaidon Kinker/Extender: [name] in Treatment: 102 Vital Signs Time Taken: 09:45 Temperature (F): 98.5 Height (in): 69 Pulse (bpm): 80 Weight (lbs): 280 Respiratory Rate (breaths/min): 18 Body Mass Index (BMI): 41.3 Blood Pressure (mmHg): 118/83 Reference Range: 80 - 120 mg / dl Electronic Signature(s) Signed: 01/20/2022 6:39:17 PM By: [name] Entered By: [name] on 01/20/2022 09:56:30

## 2022-01-21 NOTE — Progress Notes (Signed)
[name], [name] ([id number]) Visit Report for 01/20/2022 Chief Complaint Document Details Patient Name: Date of Service: [name], [name] 01/20/2022 9:30 A M Medical Record Number: [id number] Patient Account Number: [id number] Date of Birth/Sex: Treating RN: Feb 06, 1976 (46 y.o. [name] Primary Care Provider: [name] Other Clinician: Referring Provider: Treating Provider/Extender: [name] in Treatment: 80 Information Obtained from: Patient Chief Complaint Bilateral Plantar Foot Ulcers Electronic Signature(s) Signed: 01/20/2022 10:52:51 AM By: Fredirick Maudlin MD FACS Entered By: Fredirick Maudlin on 01/20/2022 10:52:51 -------------------------------------------------------------------------------- Cellular or Tissue Based Product Details Patient Name: Date of Service: [name], [name] 01/20/2022 9:30 A M Medical Record Number: [id number] Patient Account Number: [id number] Date of Birth/Sex: Treating RN: 1976/07/10 (46 y.o. [name] Primary Care Provider: [name] Other Clinician: Referring Provider: Treating Provider/Extender: [name] in Treatment: 66 Cellular or Tissue Based Product Type Wound #2 Left Calcaneus Applied [name]: Performed By: Physician Fredirick Maudlin, MD Cellular or Tissue Based Product Type: Other Level of Consciousness (Pre-procedure): Awake and Alert Pre-procedure Verification/Time Out Yes - 10:26 Taken: Location: genitalia / hands / feet / multiple digits Wound Size (sq cm): 1 Product Size (sq cm): 8 Waste Size (sq cm): 0 Amount of Product Applied (sq cm): 8 Instrument Used: Forceps, Scissors Lot #: [id number] Order #: [id number] Expiration Date: 10/12/2024 Fenestrated: No Reconstituted: No Secured: Yes Secured With: Steri-Strips Dressing Applied: Yes Primary Dressing: Adaptic;gauze Procedural Pain: 0 Post Procedural Pain: 0 Response [name] Treatment: Procedure was tolerated  well Level of Consciousness (Post- Awake and Alert procedure): Post Procedure Diagnosis Same as Pre-procedure Notes ***** TRIAL VENDAJE ***** DO NOT CHARGE***** Electronic Signature(s) Signed: 01/20/2022 6:39:17 PM By: [name] Signed: 01/21/2022 7:27:00 AM By: Fredirick Maudlin MD FACS Entered By: [name] on 01/20/2022 18:27:23 -------------------------------------------------------------------------------- Debridement Details Patient Name: Date of Service: [name], [name] [place] E. 01/20/2022 9:30 A M Medical Record Number: [id number] Patient Account Number: [id number] Date of Birth/Sex: Treating RN: 1975-12-30 (46 y.o. [name] Primary Care Provider: [name] Other Clinician: Referring Provider: Treating Provider/Extender: [name] in Treatment: 79 Debridement Performed for Assessment: Wound #1 Right Calcaneus Performed By: Physician Fredirick Maudlin, MD Debridement Type: Debridement Level of Consciousness (Pre-procedure): Awake and Alert Pre-procedure Verification/Time Out Yes - 10:26 Taken: Start Time: 10:26 Pain Control: Lidocaine 4% T opical Solution T Area Debrided (L x W): otal 0.5 (cm) x 0.2 (cm) = 0.1 (cm) Tissue and other material debrided: Non-Viable, Callus, Slough, Slough Level: Non-Viable Tissue Debridement Description: Selective/Open Wound Instrument: Curette Bleeding: Minimum Hemostasis Achieved: Pressure End Time: 10:28 Procedural Pain: 0 Post Procedural Pain: 0 Response [name] Treatment: Procedure was tolerated well Level of Consciousness (Post- Awake and Alert procedure): Post Debridement Measurements of Total Wound Length: (cm) 0.5 Stage: Category/Stage III Width: (cm) 0.2 Depth: (cm) 0.2 Volume: (cm) 0.016 Character of Wound/Ulcer Post Debridement: Improved Post Procedure Diagnosis Same as Pre-procedure Electronic Signature(s) Signed: 01/20/2022 10:57:10 AM By: Fredirick Maudlin MD FACS Signed:  01/20/2022 6:39:17 PM By: [name] Entered By: [name] on 01/20/2022 10:30:00 -------------------------------------------------------------------------------- Debridement Details Patient Name: Date of Service: [name], [name] 01/20/2022 9:30 A M Medical Record Number: [id number] Patient Account Number: [id number] Date of Birth/Sex: Treating RN: 1976-07-20 (46 y.o. [name] Primary Care Provider: [name] Other Clinician: Referring Provider: Treating Provider/Extender: [name] in Treatment: 79 Debridement Performed for Assessment: Wound #2 Left Calcaneus Performed By: Physician Fredirick Maudlin, MD  Debridement Type: Debridement Level of Consciousness (Pre-procedure): Awake and Alert Pre-procedure Verification/Time Out Yes - 10:26 Taken: Start Time: 10:26 Pain Control: Lidocaine 4% T opical Solution T Area Debrided (L x W): otal 2 (cm) x 0.5 (cm) = 1 (cm) Tissue and other material debrided: Non-Viable, Callus, Slough, Subcutaneous, Slough Level: Skin/Subcutaneous Tissue Debridement Description: Excisional Instrument: Curette Bleeding: Minimum Hemostasis Achieved: Pressure End Time: 10:28 Procedural Pain: 0 Post Procedural Pain: 0 Response [name] Treatment: Procedure was tolerated well Level of Consciousness (Post- Awake and Alert procedure): Post Debridement Measurements of Total Wound Length: (cm) 2 Stage: Category/Stage III Width: (cm) 0.5 Depth: (cm) 0.2 Volume: (cm) 0.157 Character of Wound/Ulcer Post Debridement: Improved Post Procedure Diagnosis Same as Pre-procedure Electronic Signature(s) Signed: 01/20/2022 10:57:10 AM By: Fredirick Maudlin MD FACS Signed: 01/20/2022 6:39:17 PM By: [name] Entered By: [name] on 01/20/2022 10:30:55 -------------------------------------------------------------------------------- HPI Details Patient Name: Date of Service: [name], [name] E. 01/20/2022 9:30 A  M Medical Record Number: [id number] Patient Account Number: [id number] Date of Birth/Sex: Treating RN: 1976-08-04 (46 y.o. [name] Primary Care Provider: [name] Other Clinician: Referring Provider: Treating Provider/Extender: [name] in Treatment: 46 History of Present Illness HPI Description: Wounds are12/03/2020 upon evaluation today patient presents for initial inspection here in our clinic concerning issues he has been having with the bottoms of his feet bilaterally. He states these actually occurred as wounds when he was hospitalized for 5 months secondary [name] Covid. He was apparently with tilting bed where he was in an upright position quite frequently and apparently this occurred in some way shape or form during that time. Fortunately there is no sign of active infection at this time. No fevers, chills, nausea, vomiting, or diarrhea. With that being said he still has substantial wounds on the plantar aspects of his feet Theragen require quite a bit of work [name] get these [name] heal. He has been using Santyl currently though that is been problematic both in receiving the medication as well as actually paid for it as it is become quite expensive. Prior [name] the experience with Covid the patient really did not have any major medical problems other than hypertension he does have some mild generalized weakness following the Covid experience. 07/22/2020 on evaluation today patient appears [name] be doing okay in regard [name] his foot ulcers I feel like the wound beds are showing signs of better improvement that I do believe the Iodoflex is helping in this regard. With that being said he does have a lot of drainage currently and this is somewhat blue/green in nature which is consistent with Pseudomonas. I do think a culture today would be appropriate for [place] [name] evaluate and see if that is indeed the case I would likely start him on antibiotic orally as well he is not  allergic [name] Cipro knows of no issues he has had in the past 12/21; patient was admitted [name] the clinic earlier this month with bilateral presumed pressure ulcers on the bottom of his feet apparently related [name] excessive pressure from a tilt table arrangement in the intensive care unit. Patient relates this [name] being on ECMO but I am not really sure that is exactly related [name] that. I must say I have never seen anything like this. He has fairly extensive full-thickness wounds extending from his heel towards his midfoot mostly centered laterally. There is already been some healing distally. He does not appear [name] have an arterial issue. He has been using  gentamicin [name] the wound surfaces with Iodoflex [name] help with ongoing debridement 1/6; this is a patient with pressure ulcers on the bottom of his feet related [name] excessive pressure from a standing position in the intensive care unit. He is complaining of a lot of pain in the right heel. He is not a diabetic. 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 We have been using Iodoflex [name] help prepare the surfaces of both wounds for an advanced treatment product. He is nonambulatory spending most of his time in a wheelchair I have asked him not [name] propel the wheelchair with his heels 1/13; in general his wounds look better not much surface area change we have been using Iodoflex as of last week. I did an x-ray of the right heel as the patient was complaining of pain. I had some thoughts about a stress fracture perhaps Achilles tendon problems however what it showed was erosive changes along the inferior aspect of the calcaneus he now has a MRI booked for 1/20. 1/20; in general his wounds continue [name] be better. Some improvement in the large narrow areas proximally in his foot. He is still complaining of pain in the right heel and tenderness in certain areas of this wound. His MRI is tonight. I am not just looking for osteomyelitis that was  brought up on the x-ray I am wondering about stress fractures, tendon ruptures etc. He has no such findings on the left. Also noteworthy is that the patient had critical illness neuropathy and some of the discomfort may be actual improvement in nerve function I am just not sure. These wounds were initially in the setting of severe critical illness related [name] COVID-19. He was put in a standing position. He may have also been on pressors at the point contributing [name] tissue ischemia. By his description at some point these wounds were grossly necrotic extending proximally up into the Achilles part of his heel. I do not know that I have ever really seen pictures of them like this although they may exist in epic We have ordered Tri layer Oasis. I am trying [name] stimulate some granulation in these areas. This is of course assuming the MRI is negative for infection 1/27; since the patient was last here he saw Dr. Juleen China of infectious disease. He is planned for vancomycin and ceftriaxone. Prior operative culture grew MSSA. Also ordered baseline lab work. He also ordered arterial studies although the ABIs in our clinic were normal as well as his clinical exam these were normal I do not think he needs [name] see vascular surgery. His ABIs at the P[facility] were 1.22 in the right triphasic waveforms with a normal TBI of 1.15 on the left ABI of 1.22 with triphasic waveforms and a normal TBI of 1.08. Finally he saw Dr. Amalia Hailey who will follow him in for 2 months. At this point I do not think he felt that he needed a procedure on the right calcaneal bone. Dr. Juleen China is elected for broad-spectrum antibiotic The patient is still having pain in the right heel. He walks with a walker 2/3; wounds are generally smaller. He is tolerating his IV antibiotics. I believe this is vancomycin and ceftriaxone. We are still waiting for Oasis burn in terms of his out-of-pocket max which he should be meeting soon given the IV antibiotics, MRIs  etc. I have asked him [name] check in on this. We are using silver collagen in the meantime the wounds look better 2/10; tolerating IV  vancomycin and Rocephin. We are waiting [name] apply for Oasis. Although I am not really sure where he is in his out-of-pocket max. 2/17 started the first application of Oasis trilayer. Still on antibiotics. The wounds have generally look better. The area on the left has a little more surface slough requiring debridement 5/09; second application of Oasis trilayer. The wound surface granulation is generally look better. The area on the left with undermining laterally I think is come in a bit. 10/08/2020 upon evaluation today patient is here today for [employer] application #3. Fortunately he seems [name] be doing extremely well with regard [name] this and we are seeing a lot of new epithelial growth which is great news. Fortunately there is no signs of active infection at this time. 10/16/2020 upon evaluation today patient appears [name] be doing well with regard [name] his foot ulcers. Do believe the Oasis has been of benefit for him. I do not see any signs of infection right now which is great news and I think that he has a lot of new epithelial growth which is great [name] see as well. The patient is very pleased [name] hear all of this. I do think we can proceed with the Oasis trilayer #4 today. 3/18; not as much improvement in these areas on his heels that I was hoping. I did reapply trilateral Oasis today the tissue looks healthier but not as much fill in as I was hoping. 3/25; better looking today I think this is come in a bit the tissue looks healthier. Triple layer Oasis reapplied #6 4/1; somewhat better looking definitely better looking surface not as much change in surface area as I was hoping. He may be spending more time [place] on days then he needs [name] although he does have heel offloading boots. Triple layer Oasis reapplied #7 4/7; unfortunately apparently [facility] will  not approve any further Oasis which is unfortunate since the patient did respond nicely both in terms of the condition of the wound bed as well as surface area. There is still some drainage coming from the wound but not a lot there does not appear [name] be any infection 4/15; we have been using Hydrofera Blue. He continues [name] have nice rims of epithelialization on the right greater than the left. The left the epithelialization is coming from the tip of his heel. There is moderate drainage. In this that concerns me about a total contact cast. There is no evidence of infection 4/29; patient has been using Hydrofera Blue with dressing changes. He has no complaints or issues today. 5/5; using Hydrofera Blue. I actually think that he looks marginally better than the last time I saw this 3 weeks ago. There are rims of epithelialization on the left thumb coming from the medial side on the right. Using Hydrofera Blue 5/12; using Hydrofera Blue. These continue [name] make improvements in surface area. His drainage was not listed as severe I therefore went ahead and put a cast on the left foot. Right foot we will continue [name] dress his previous 5/16; back for first total contact cast change. He did not tolerate this particularly well cast injury on the anterior tibia among other issues. Difficulty sleeping. I talked him about this in some detail and afterwards is elected [name] continue. I told him I would like [name] have a cast on for 3 weeks [name] see if this is going [name] help at all. I think he agreed 5/19; I think the wound is better.  There is no tunneling towards his midfoot. The undermining medially also looks better. He has a rim of new skin distally. I think we are making progress here. The area on the left also continues [name] look somewhat better [name] me using Hydrofera Blue. He has a list of complaints about the cast but none of them seem serious 5/26; patient presents for 1 week follow-up. He has been using a total  contact cast and tolerating this well. Hydrofera Blue is the main dressing used. He denies signs of infection. 6/2 Hydrofera Blue total contact cast on the left. These were large ulcers that formed in intensive care unit where the patient was recovering from [place]. May have had something to do with being ventilated in an upright positiono Pressors etc. We have been able [name] get the areas down considerably and a viable surface. There is some epithelialization in both sides. Note made of drainage 6/9; changed [name] [facility] last time because of drainage. He arrives with better looking surfaces and dimensions on the left than the right. Paradoxically the right actually probes more towards his midfoot the left is largely close down but both of these look improved. Using a total contact cast on the left 6/16; complex wounds on his bilateral plantar heels which were initially pressure injury from a stay in the ICU with COVID. We have been using silver alginate most recently. His dimensions of come in quite dramatically however not recently. We have been putting the left foot in a total contact cast 6/23; complex wounds on the bilateral plantar heels. I been putting the left in the cast paradoxically the area on the right is the one that is going towards closure at a faster rate. Quite a bit of drainage on the left. The patient went [name] see Dr. Amalia Hailey who said he was going [name] standby for skin grafts. I had actually considered sending him for skin grafts however he would be mandatorily off his feet for a period of weeks [name] months. I am thinking that the area on the right is going [name] close on its own the area on the left has been more stubborn even though we have him in a total contact cast 6/30; took him out of a total contact cast last week is the right heel seem [name] be making better progress than the left where I was placing the cast. We are using silver alginate. Both wounds are smaller right greater than  left 7/12; both wounds look as though they are making some progress. We are using silver alginate. Heel offloading boots 7/26; very gradual progress especially on the right. Using silver alginate. He is wearing heel offloading boots 8/18; he continues [name] close these wounds down very gradually. Using silver alginate. The problem polymen being definitive about this is areas of what appears [name] be callus around the margins. This is not a 100% of the area but certainly sizable especially on the right 9/1; bilateral plantar feet wounds secondary [name] prolonged pressure while being ventilated for COVID-19 in an upright position. Essentially pressure ulcers on the bottom of his feet. He is made substantial progress using silver alginate. 9/14; bilateral plantar feet wounds secondary [name] prolonged pressure. Making progress using silver alginate. 9/29 bilateral plantar feet wounds secondary [name] prolonged pressure. I changed him [name] Iodoflex last week. MolecuLight showing reddened blush fluorescence 10/11; patient presents for follow-up. He has no issues or complaints today. He denies signs of infection. He continues [name] use Iodoflex and antibiotic ointment  [name] the wound beds. 10/27; 2-week follow-up. No evidence of infection. He has callus and thick dry skin around the wound margins we have been using Iodoflex and Bactroban which was in response [name] a moderate left MolecuLight reddish blush fluorescence. 11/10; 2-week follow-up. Wound margins again have thick callus however the measurements of the actual wound sites are a lot smaller. Everything looks reasonably healthy here. We have been using Iodoflex He was approved for prime matrix but I have elected [name] delay this given the improvement in the surface area. Hopefully I will not regret that decision as were getting close [name] the end of the year in terms of insurance payment 12/8; 2-week follow-up. Wounds are generally smaller in size. These were initially  substantial wounds extending into the forefoot all the way into the heel on the bilateral plantar feet. They are now both located on the plantar heel distal aspect both of these have a lot of callus around the wounds I used a #5 curette [name] remove this on the right and the left also some subcutaneous debris [name] try and get the wound edges were using Iodoflex. He has heel offloading shoe 12/22; 2-week follow-up. Not really much improvement. He has thick callus around the outer edges of both wounds. I remove this there is some nonviable subcutaneous tissue as well. We have been using Iodoflex. Her intake nurse and myself spontaneously thought of a total contact cast I went back in May. At that time we really were not seeing much of an improvement with a cast although the wound was in a much different situation I would like [name] retry this in 2 weeks and I discussed this with the patient 08/12/2021; the patient has had some improvement with the Iodoflex. The the area on the left heel plantar more improved than the right. I had [name] put him in a total contact cast on the left although I decided [name] put that off for 2 weeks. I am going [name] change his primary dressing [name] silver collagen. I think in both areas he has had some improvement most of the healing seems [name] be more proximal in the heel. The wounds are in the mid aspect. A lot of thick callus on the right heel however. 1/19; we are using silver collagen on both plantar heel areas. He has had some improvement today. The left did not require any debridement. He still had some eschar on the right that was debrided but both seem [name] have contracted. I did not put it total contact cast on him today 2/2 we have been using silver collagen. The area on the right plantar heel has areas that appear [name] be epithelialized interspersed with dry flaking callus and dry skin. I removed this. This really looks better than on the other side. On the left still a large area with  raised edges and debris on the surface. 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 He has no issues or complaints today. 2/9; not much change in the left foot wound with 1 week of a cast we are using silver collagen. Silver collagen on the right side. The right side has been the better wound surface. We will reapply the total contact cast on the left 2/16; not much improvement on either side I been using silver collagen with a total contact cast on the left. I'm changing  the [facility] Blue still with a total contact cast on the left 2/23; some improvement on both sides. Disappointing that he has thick callus around the area that we are putting in a total contact cast on the left. We've been using Hydrofera Blue on both wound areas. This is a man who at essentially pressure ulcers in addition [name] ischemia caused by medications [name] support his blood pressure (pressors) in the ICU. He was being ventilated in the standing position for severe Covid. A Shiley the wounds extended across his entire foot but are now localized [name] his plantar heels bilaterally. We have made progress however neither areas healed. I continue [name] think the total contact cast is helped albeit painstakingly slowly. He has never wanted a plastic surgery consult although I don't know that they would be interested in grafting in area in this location. 10/07/2021: Continued improvement bilaterally. There is still some callus around the left wound, despite the total contact cast. He has some increased pain in his right midfoot around 1 particular area. This has been painful in the past but seems [name] be a little bit worse. When his cast was removed today, there was an area on the heel of the left foot that looks a bit macerated. He is also complaining of pain in his left thigh and hip which he thinks is secondary [name]  the limb length discrepancy caused by the cast. 10/14/2021: He continues [name] improve. A little bit less callus around the left wound. He continues [name] endorse pain in his right midfoot, but this is not as significant as it was last week. The maceration on his left heel is improved. 10/21/2021: Continued improvement [name] both wounds. The maceration on his left heel is no longer evident. Less callus bilaterally. Epithelialization progressing. 10/28/2021: Significant improvement this week. The right sided wound is nearly closed with just a small open area at the middle. No maceration seen on the left heel. Continued epithelialization on both sides. No concern for infection. 11/04/2021: T oday, the wounds were measured a little bit differently and come out as larger, but I actually think they are about the same [name] potentially even smaller, particularly on the left. He continues [name] accumulate some callus on the right. 11/11/2021: T oday, the patient is expressing some concern that the left wound, despite being in the total contact cast, is not progressing at the same rate as the 1 on the right. He is interested in trying a week without the cast [name] see how the wound does. The wounds are roughly the same size as last week, with the right perhaps being a little bit smaller. He continues [name] build up callus on both sites. 11/18/2021: Last week, I permitted the patient [name] go without his total contact cast, just [name] see if the cast was really making any difference. Today, both wounds have deteriorated [name] some extent, suggesting that the cast is providing benefit, at least on the left. Both are larger and have accumulated callus, slough, and other debris. 11/26/2021: I debrided both wounds quite aggressively last week in an effort [name] stimulate the healing cascade. This appears [name] have been effective as the left sided wound is a full centimeter shorter in length. Although the right was measured slightly larger, on inspection,  it looks as though an area of epithelialized tissue was included in the measurements. We have been using PolyMem Ag on the wound surfaces with a total contact cast [name] the left. 12/02/2021: It appears that the intake  personnel are including epithelialized tissue in his wound measurements; the right wound is almost completely epithelialized; there is just a crater at the proximal midfoot with some open areas. On the left, he has built up some callus, but the overall wound surface looks good. There is some senescent skin around the wound margin. He has been in PolyMem Ag bilaterally with a total contact cast on the left. 12/09/2021: The right wound is nearly closed; there is just a small open area at the mid calcaneus. On the left, the wound is smaller with minimal callus buildup. No significant drainage. 12/16/2021: The right calcaneal wound remains minimally open at the mid calcaneus; the rest has epithelialized. On the left, the wound is also a little bit smaller. There is some senescent tissue on the periphery. He is getting his first application of a trial skin substitute called Vendaje today. 12/23/2021: The wound on his right calcaneus is nearly closed; there is just a small area at the most distal aspect of the calcaneus that is open. On the left, the area where we applied [name] the skin substitute has a healthier-looking bed of granulation tissue. The wound dimensions are not significantly different on this side but the wound surface is improved. 12/30/2021: The wound on the right calcaneus has not changed significantly aside from some accumulation of callus. On the left, the open area is smaller and continues [name] have an improved surface. He continues [name] accumulate callus around the wound. He is here for his third application of Vendaje. 01/06/2022: The right calcaneal wound is down [name] just a couple of millimeters. He continues [name] accumulate periwound callus. He unfortunately got his cast wet earlier in  the week and his left foot is macerated, resulting in some superficial skin loss just distal [name] the open wound. The open wound itself, however, is much smaller and has a healthier appearing surface. He is here for his fourth application of Vendaje. 01/13/2022: The right calcaneal wound is about the same. Unfortunately, once again, his cast got wet and his foot is again macerated. This is caused the left calcaneal wound [name] enlarge. He is here for his fifth application of Vendaje. 01/20/2022: The right calcaneal wound is very small. There is some periwound callus accumulation. He purchased a new cast protector last week and this has been effective in avoiding the maceration that has been occurring on the left. The left calcaneal wound is narrower and has a healthy and viable-appearing surface. He is here for his 6 application of Vendaje. Electronic Signature(s) Signed: 01/20/2022 10:53:55 AM By: Fredirick Maudlin MD FACS Entered By: Fredirick Maudlin on 01/20/2022 10:53:55 -------------------------------------------------------------------------------- Physical Exam Details Patient Name: Date of Service: [name], [name] [place] E. 01/20/2022 9:30 A M Medical Record Number: [id number] Patient Account Number: [id number] Date of Birth/Sex: Treating RN: 09-30-1975 (46 y.o. [name] Primary Care Provider: [name] Other Clinician: Referring Provider: Treating Provider/Extender: [name] in Treatment: 23 Constitutional . . . . No acute distress.Marland Kitchen Respiratory Normal work of breathing on room air.. Notes 01/20/2022: The right calcaneal wound is very small. There is some periwound callus accumulation. The left calcaneal wound is narrower and has a healthy and viable-appearing surface. The periwound skin is in much better condition and is not macerated today. Electronic Signature(s) Signed: 01/20/2022 10:54:51 AM By: Fredirick Maudlin MD FACS Entered By: Fredirick Maudlin on  01/20/2022 10:54:51 -------------------------------------------------------------------------------- Physician Orders Details Patient Name: Date of Service: [name], [name] [place] E. 01/20/2022 9:30 A M  Medical Record Number: [id number] Patient Account Number: [id number] Date of Birth/Sex: Treating RN: [birth year] (46 y.o. [name] Primary Care Provider: [name] Other Clinician: Referring Provider: Treating Provider/Extender: [name] in Treatment: 43 Verbal / Phone Orders: No Diagnosis Coding ICD-10 Coding Code Description L97.512 Non-pressure chronic ulcer of other part of right foot with fat layer exposed L97.522 Non-pressure chronic ulcer of other part of left foot with fat layer exposed Follow-up Appointments ppointment in 1 week. - Dr [name] - Room 3 Return A Cellular or Tissue Based Products Cellular or Tissue Based Product Type: - Vendaje #5 (trial) Cellular or Tissue Based Product applied [name] wound bed, secured with steri-strips, cover with Adaptic or Mepitel. (DO NOT REMOVE). Bathing/ Shower/ Hygiene May shower with protection but do not get wound dressing(s) wet. - Cover with cast protector (can purchase cast protector at [facility] or [facility] ) Edema Control - Lymphedema / SCD / Other Bilateral Lower Extremities Elevate legs [name] the level of the heart or above for 30 minutes daily and/or when sitting, a frequency of: - throughout the day Avoid standing for long periods of time. Moisturize legs daily. - right leg and foot every night. Off-Loading Total Contact Cast [name] Left Lower Extremity Wedge shoe [name]: - Globoped Shoe [name] right foot and left foot when not in TCC Other: - keep pressure off of the bottom of your feet Additional Orders / Instructions Follow Nutritious Diet Wound Treatment Wound #1 - Calcaneus Wound Laterality: Right Cleanser: Normal Saline (Generic) Every Other Day/30 Days Discharge Instructions: Cleanse the wound with Normal  Saline prior [name] applying a clean dressing using gauze sponges, not tissue or cotton balls. Cleanser: Wound Cleanser (Generic) Every Other Day/30 Days Discharge Instructions: Cleanse the wound with wound cleanser prior [name] applying a clean dressing using gauze sponges, not tissue or cotton balls. Prim Dressing: PolyMem Silver Non-Adhesive Dressing, 4.25x4.25 in (Generic) Every Other Day/30 Days ary Discharge Instructions: Apply [name] wound bed as instructed Secondary Dressing: Zetuvit Plus 4x8 in (Generic) Every Other Day/30 Days Discharge Instructions: Apply over primary dressing as directed. Secured With: Elastic Bandage 4 inch (ACE bandage) (Generic) Every Other Day/30 Days Discharge Instructions: Secure with ACE bandage as directed. Secured With: [employer], 4.5x3.1 (in/yd) (Generic) Every Other Day/30 Days Discharge Instructions: Secure with Kerlix as directed. Wound #2 - Calcaneus Wound Laterality: Left Cleanser: Normal Saline (Generic) 1 x Per Week/30 Days Discharge Instructions: Cleanse the wound with Normal Saline prior [name] applying a clean dressing using gauze sponges, not tissue or cotton balls. Cleanser: Wound Cleanser 1 x Per Week/30 Days Discharge Instructions: Cleanse the wound with wound cleanser prior [name] applying a clean dressing using gauze sponges, not tissue or cotton balls. Peri-Wound Care: Zinc Oxide Ointment 30g tube 1 x Per Week/30 Days Discharge Instructions: Apply Zinc Oxide [name] periwound with each dressing change Prim Dressing: Vendaje ary 1 x Per Week/30 Days Secondary Dressing: Optifoam Non-Adhesive Dressing, 4x4 in (Generic) 1 x Per Week/30 Days Discharge Instructions: Apply over primary dressing as directed. Secondary Dressing: Zetuvit Plus 4x8 in (Generic) 1 x Per Week/30 Days Discharge Instructions: Apply over primary dressing as directed. Secured With: [employer], 4.5x3.1 (in/yd) (Generic) 1 x Per Week/30 Days Discharge Instructions: Secure with  Kerlix as directed. Secured With: 48M Medipore H Soft Cloth Surgical T ape, 4 x 10 (in/yd) (Generic) 1 x Per Week/30 Days Discharge Instructions: Secure with tape as directed. Electronic Signature(s) Signed: 01/20/2022 11:37:33 AM By: Fredirick Maudlin MD FACS Signed:  01/20/2022 6:39:17 PM By: [name] Entered By: [name] on 01/20/2022 11:22:52 -------------------------------------------------------------------------------- Problem List Details Patient Name: Date of Service: [name], [name] 01/20/2022 9:30 A M Medical Record Number: [id number] Patient Account Number: [id number] Date of Birth/Sex: Treating RN: Nov 10, 1975 (46 y.o. [name] Primary Care Provider: [name] Other Clinician: Referring Provider: Treating Provider/Extender: [name] in Treatment: 28 Active Problems ICD-10 Encounter Code Description Active Date MDM Diagnosis L97.512 Non-pressure chronic ulcer of other part of right foot with fat layer exposed 09/03/2020 No Yes L97.522 Non-pressure chronic ulcer of other part of left foot with fat layer exposed 09/03/2020 No Yes Inactive Problems ICD-10 Code Description Active Date Inactive Date L89.893 Pressure ulcer of other site, stage 3 07/15/2020 07/15/2020 M62.81 Muscle weakness (generalized) 07/15/2020 07/15/2020 I10 Essential (primary) hypertension 07/15/2020 07/15/2020 M86.171 Other acute osteomyelitis, right ankle and foot 09/03/2020 09/03/2020 Resolved Problems Electronic Signature(s) Signed: 01/20/2022 10:52:32 AM By: Fredirick Maudlin MD FACS Entered By: Fredirick Maudlin on 01/20/2022 10:52:31 -------------------------------------------------------------------------------- Progress Note Details Patient Name: Date of Service: [name], [name] 01/20/2022 9:30 A M Medical Record Number: [id number] Patient Account Number: [id number] Date of Birth/Sex: Treating RN: 09/14/1975 (46 y.o. [name] Primary  Care Provider: [name] Other Clinician: Referring Provider: Treating Provider/Extender: [name] in Treatment: 101 Subjective Chief Complaint Information obtained from Patient Bilateral Plantar Foot Ulcers History of Present Illness (HPI) Wounds are12/03/2020 upon evaluation today patient presents for initial inspection here in our clinic concerning issues he has been having with the bottoms of his feet bilaterally. He states these actually occurred as wounds when he was hospitalized for 5 months secondary [name] Covid. He was apparently with tilting bed where he was in an upright position quite frequently and apparently this occurred in some way shape or form during that time. Fortunately there is no sign of active infection at this time. No fevers, chills, nausea, vomiting, or diarrhea. With that being said he still has substantial wounds on the plantar aspects of his feet Theragen require quite a bit of work [name] get these [name] heal. He has been using Santyl currently though that is been problematic both in receiving the medication as well as actually paid for it as it is become quite expensive. Prior [name] the experience with Covid the patient really did not have any major medical problems other than hypertension he does have some mild generalized weakness following the Covid experience. 07/22/2020 on evaluation today patient appears [name] be doing okay in regard [name] his foot ulcers I feel like the wound beds are showing signs of better improvement that I do believe the Iodoflex is helping in this regard. With that being said he does have a lot of drainage currently and this is somewhat blue/green in nature which is consistent with Pseudomonas. I do think a culture today would be appropriate for [place] [name] evaluate and see if that is indeed the case I would likely start him on antibiotic orally as well he is not allergic [name] Cipro knows of no issues he has had in the past 12/21;  patient was admitted [name] the clinic earlier this month with bilateral presumed pressure ulcers on the bottom of his feet apparently related [name] excessive pressure from a tilt table arrangement in the intensive care unit. Patient relates this [name] being on ECMO but I am not really sure that is exactly related [name] that. I must say I have never seen anything like  this. He has fairly extensive full-thickness wounds extending from his heel towards his midfoot mostly centered laterally. There is already been some healing distally. He does not appear [name] have an arterial issue. He has been using gentamicin [name] the wound surfaces with Iodoflex [name] help with ongoing debridement 1/6; this is a patient with pressure ulcers on the bottom of his feet related [name] excessive pressure from a standing position in the intensive care unit. He is complaining of a lot of pain in the right heel. He is not a diabetic. 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 We have been using Iodoflex [name] help prepare the surfaces of both wounds for an advanced treatment product. He is nonambulatory spending most of his time in a wheelchair I have asked him not [name] propel the wheelchair with his heels 1/13; in general his wounds look better not much surface area change we have been using Iodoflex as of last week. I did an x-ray of the right heel as the patient was complaining of pain. I had some thoughts about a stress fracture perhaps Achilles tendon problems however what it showed was erosive changes along the inferior aspect of the calcaneus he now has a MRI booked for 1/20. 1/20; in general his wounds continue [name] be better. Some improvement in the large narrow areas proximally in his foot. He is still complaining of pain in the right heel and tenderness in certain areas of this wound. His MRI is tonight. I am not just looking for osteomyelitis that was brought up on the x-ray I am wondering about stress fractures, tendon  ruptures etc. He has no such findings on the left. Also noteworthy is that the patient had critical illness neuropathy and some of the discomfort may be actual improvement in nerve function I am just not sure. These wounds were initially in the setting of severe critical illness related [name] COVID-19. He was put in a standing position. He may have also been on pressors at the point contributing [name] tissue ischemia. By his description at some point these wounds were grossly necrotic extending proximally up into the Achilles part of his heel. I do not know that I have ever really seen pictures of them like this although they may exist in epic We have ordered Tri layer Oasis. I am trying [name] stimulate some granulation in these areas. This is of course assuming the MRI is negative for infection 1/27; since the patient was last here he saw Dr. Juleen China of infectious disease. He is planned for vancomycin and ceftriaxone. Prior operative culture grew MSSA. Also ordered baseline lab work. He also ordered arterial studies although the ABIs in our clinic were normal as well as his clinical exam these were normal I do not think he needs [name] see vascular surgery. His ABIs at the [facility] were 1.22 in the right triphasic waveforms with a normal TBI of 1.15 on the left ABI of 1.22 with triphasic waveforms and a normal TBI of 1.08. Finally he saw Dr. Amalia Hailey who will follow him in for 2 months. At this point I do not think he felt that he needed a procedure on the right calcaneal bone. Dr. Juleen China is elected for broad-spectrum antibiotic The patient is still having pain in the right heel. He walks with a walker 2/3; wounds are generally smaller. He is tolerating his IV antibiotics. I believe this is vancomycin and ceftriaxone. We are still waiting for Oasis burn in terms of his out-of-pocket max  which he should be meeting soon given the IV antibiotics, MRIs etc. I have asked him [name] check in on this. We are using silver collagen  in the meantime the wounds look better 2/10; tolerating IV vancomycin and Rocephin. We are waiting [name] apply for Oasis. Although I am not really sure where he is in his out-of-pocket max. 2/17 started the first application of Oasis trilayer. Still on antibiotics. The wounds have generally look better. The area on the left has a little more surface slough requiring debridement 3/84; second application of Oasis trilayer. The wound surface granulation is generally look better. The area on the left with undermining laterally I think is come in a bit. 10/08/2020 upon evaluation today patient is here today for [employer] application #3. Fortunately he seems [name] be doing extremely well with regard [name] this and we are seeing a lot of new epithelial growth which is great news. Fortunately there is no signs of active infection at this time. 10/16/2020 upon evaluation today patient appears [name] be doing well with regard [name] his foot ulcers. Do believe the Oasis has been of benefit for him. I do not see any signs of infection right now which is great news and I think that he has a lot of new epithelial growth which is great [name] see as well. The patient is very pleased [name] hear all of this. I do think we can proceed with the Oasis trilayer #4 today. 3/18; not as much improvement in these areas on his heels that I was hoping. I did reapply trilateral Oasis today the tissue looks healthier but not as much fill in as I was hoping. 3/25; better looking today I think this is come in a bit the tissue looks healthier. Triple layer Oasis reapplied #6 4/1; somewhat better looking definitely better looking surface not as much change in surface area as I was hoping. He may be spending more time [place] on days then he needs [name] although he does have heel offloading boots. Triple layer Oasis reapplied #7 4/7; unfortunately apparently [facility] will not approve any further Oasis which is unfortunate since the patient did  respond nicely both in terms of the condition of the wound bed as well as surface area. There is still some drainage coming from the wound but not a lot there does not appear [name] be any infection 4/15; we have been using Hydrofera Blue. He continues [name] have nice rims of epithelialization on the right greater than the left. The left the epithelialization is coming from the tip of his heel. There is moderate drainage. In this that concerns me about a total contact cast. There is no evidence of infection 4/29; patient has been using Hydrofera Blue with dressing changes. He has no complaints or issues today. 5/5; using Hydrofera Blue. I actually think that he looks marginally better than the last time I saw this 3 weeks ago. There are rims of epithelialization on the left thumb coming from the medial side on the right. Using Hydrofera Blue 5/12; using Hydrofera Blue. These continue [name] make improvements in surface area. His drainage was not listed as severe I therefore went ahead and put a cast on the left foot. Right foot we will continue [name] dress his previous 5/16; back for first total contact cast change. He did not tolerate this particularly well cast injury on the anterior tibia among other issues. Difficulty sleeping. I talked him about this in some detail and afterwards is elected  [name] continue. I told him I would like [name] have a cast on for 3 weeks [name] see if this is going [name] help at all. I think he agreed 5/19; I think the wound is better. There is no tunneling towards his midfoot. The undermining medially also looks better. He has a rim of new skin distally. I think we are making progress here. The area on the left also continues [name] look somewhat better [name] me using Hydrofera Blue. He has a list of complaints about the cast but none of them seem serious 5/26; patient presents for 1 week follow-up. He has been using a total contact cast and tolerating this well. Hydrofera Blue is the main dressing  used. He denies signs of infection. 6/2 Hydrofera Blue total contact cast on the left. These were large ulcers that formed in intensive care unit where the patient was recovering from [place]. May have had something to do with being ventilated in an upright positiono Pressors etc. We have been able [name] get the areas down considerably and a viable surface. There is some epithelialization in both sides. Note made of drainage 6/9; changed [name] [facility] last time because of drainage. He arrives with better looking surfaces and dimensions on the left than the right. Paradoxically the right actually probes more towards his midfoot the left is largely close down but both of these look improved. Using a total contact cast on the left 6/16; complex wounds on his bilateral plantar heels which were initially pressure injury from a stay in the ICU with COVID. We have been using silver alginate most recently. His dimensions of come in quite dramatically however not recently. We have been putting the left foot in a total contact cast 6/23; complex wounds on the bilateral plantar heels. I been putting the left in the cast paradoxically the area on the right is the one that is going towards closure at a faster rate. Quite a bit of drainage on the left. The patient went [name] see Dr. Amalia Hailey who said he was going [name] standby for skin grafts. I had actually considered sending him for skin grafts however he would be mandatorily off his feet for a period of weeks [name] months. I am thinking that the area on the right is going [name] close on its own the area on the left has been more stubborn even though we have him in a total contact cast 6/30; took him out of a total contact cast last week is the right heel seem [name] be making better progress than the left where I was placing the cast. We are using silver alginate. Both wounds are smaller right greater than left 7/12; both wounds look as though they are making some progress. We are  using silver alginate. Heel offloading boots 7/26; very gradual progress especially on the right. Using silver alginate. He is wearing heel offloading boots 8/18; he continues [name] close these wounds down very gradually. Using silver alginate. The problem polymen being definitive about this is areas of what appears [name] be callus around the margins. This is not a 100% of the area but certainly sizable especially on the right 9/1; bilateral plantar feet wounds secondary [name] prolonged pressure while being ventilated for COVID-19 in an upright position. Essentially pressure ulcers on the bottom of his feet. He is made substantial progress using silver alginate. 9/14; bilateral plantar feet wounds secondary [name] prolonged pressure. Making progress using silver alginate. 9/29 bilateral plantar feet wounds secondary [name] prolonged pressure.  I changed him [name] Iodoflex last week. MolecuLight showing reddened blush fluorescence 10/11; patient presents for follow-up. He has no issues or complaints today. He denies signs of infection. He continues [name] use Iodoflex and antibiotic ointment [name] the wound beds. 10/27; 2-week follow-up. No evidence of infection. He has callus and thick dry skin around the wound margins we have been using Iodoflex and Bactroban which was in response [name] a moderate left MolecuLight reddish blush fluorescence. 11/10; 2-week follow-up. Wound margins again have thick callus however the measurements of the actual wound sites are a lot smaller. Everything looks reasonably healthy here. We have been using Iodoflex He was approved for prime matrix but I have elected [name] delay this given the improvement in the surface area. Hopefully I will not regret that decision as were getting close [name] the end of the year in terms of insurance payment 12/8; 2-week follow-up. Wounds are generally smaller in size. These were initially substantial wounds extending into the forefoot all the way into the heel on  the bilateral plantar feet. They are now both located on the plantar heel distal aspect both of these have a lot of callus around the wounds I used a #5 curette [name] remove this on the right and the left also some subcutaneous debris [name] try and get the wound edges were using Iodoflex. He has heel offloading shoe 12/22; 2-week follow-up. Not really much improvement. He has thick callus around the outer edges of both wounds. I remove this there is some nonviable subcutaneous tissue as well. We have been using Iodoflex. Her intake nurse and myself spontaneously thought of a total contact cast I went back in May. At that time we really were not seeing much of an improvement with a cast although the wound was in a much different situation I would like [name] retry this in 2 weeks and I discussed this with the patient 08/12/2021; the patient has had some improvement with the Iodoflex. The the area on the left heel plantar more improved than the right. I had [name] put him in a total contact cast on the left although I decided [name] put that off for 2 weeks. I am going [name] change his primary dressing [name] silver collagen. I think in both areas he has had some improvement most of the healing seems [name] be more proximal in the heel. The wounds are in the mid aspect. A lot of thick callus on the right heel however. 1/19; we are using silver collagen on both plantar heel areas. He has had some improvement today. The left did not require any debridement. He still had some eschar on the right that was debrided but both seem [name] have contracted. I did not put it total contact cast on him today 2/2 we have been using silver collagen. The area on the right plantar heel has areas that appear [name] be epithelialized interspersed with dry flaking callus and dry skin. I removed this. This really looks better than on the other side. On the left still a large area with raised edges and debris on the surface. The patient states he is in the heel  offloading boots for a prolonged period of time and really does not use any other footwear 2/6; patient presents for first cast exchange. He has no issues or complaints today. 2/9; not much change in the left foot wound with 1 week of a cast we are using silver collagen. Silver collagen on the right side. The right side has  been the better wound surface. We will reapply the total contact cast on the left 2/16; not much improvement on either side I been using silver collagen with a total contact cast on the left. I'm changing the Hydrofera Blue still with a total contact cast on the left 2/23; some improvement on both sides. Disappointing that he has thick callus around the area that we are putting in a total contact cast on the left. We've been using Hydrofera Blue on both wound areas. This is a man who at essentially pressure ulcers in addition [name] ischemia caused by medications [name] support his blood pressure (pressors) in the ICU. He was being ventilated in the standing position for severe Covid. A Shiley the wounds extended across his entire foot but are now localized [name] his plantar heels bilaterally. We have made progress however neither areas healed. I continue [name] think the total contact cast is helped albeit painstakingly slowly. He has never wanted a plastic surgery consult although I don't know that they would be interested in grafting in area in this location. 10/07/2021: Continued improvement bilaterally. There is still some callus around the left wound, despite the total contact cast. He has some increased pain in his right midfoot around 1 particular area. This has been painful in the past but seems [name] be a little bit worse. When his cast was removed today, there was an area on the heel of the left foot that looks a bit macerated. He is also complaining of pain in his left thigh and hip which he thinks is secondary [name] the limb length discrepancy caused by the cast. 10/14/2021: He continues [name]  improve. A little bit less callus around the left wound. He continues [name] endorse pain in his right midfoot, but this is not as significant as it was last week. The maceration on his left heel is improved. 10/21/2021: Continued improvement [name] both wounds. The maceration on his left heel is no longer evident. Less callus bilaterally. Epithelialization progressing. 10/28/2021: Significant improvement this week. The right sided wound is nearly closed with just a small open area at the middle. No maceration seen on the left heel. Continued epithelialization on both sides. No concern for infection. 11/04/2021: T oday, the wounds were measured a little bit differently and come out as larger, but I actually think they are about the same [name] potentially even smaller, particularly on the left. He continues [name] accumulate some callus on the right. 11/11/2021: T oday, the patient is expressing some concern that the left wound, despite being in the total contact cast, is not progressing at the same rate as the 1 on the right. He is interested in trying a week without the cast [name] see how the wound does. The wounds are roughly the same size as last week, with the right perhaps being a little bit smaller. He continues [name] build up callus on both sites. 11/18/2021: Last week, I permitted the patient [name] go without his total contact cast, just [name] see if the cast was really making any difference. Today, both wounds have deteriorated [name] some extent, suggesting that the cast is providing benefit, at least on the left. Both are larger and have accumulated callus, slough, and other debris. 11/26/2021: I debrided both wounds quite aggressively last week in an effort [name] stimulate the healing cascade. This appears [name] have been effective as the left sided wound is a full centimeter shorter in length. Although the right was measured slightly larger, on inspection, it looks  as though an area of epithelialized tissue was included in the  measurements. We have been using PolyMem Ag on the wound surfaces with a total contact cast [name] the left. 12/02/2021: It appears that the intake personnel are including epithelialized tissue in his wound measurements; the right wound is almost completely epithelialized; there is just a crater at the proximal midfoot with some open areas. On the left, he has built up some callus, but the overall wound surface looks good. There is some senescent skin around the wound margin. He has been in PolyMem Ag bilaterally with a total contact cast on the left. 12/09/2021: The right wound is nearly closed; there is just a small open area at the mid calcaneus. On the left, the wound is smaller with minimal callus buildup. No significant drainage. 12/16/2021: The right calcaneal wound remains minimally open at the mid calcaneus; the rest has epithelialized. On the left, the wound is also a little bit smaller. There is some senescent tissue on the periphery. He is getting his first application of a trial skin substitute called Vendaje today. 12/23/2021: The wound on his right calcaneus is nearly closed; there is just a small area at the most distal aspect of the calcaneus that is open. On the left, the area where we applied [name] the skin substitute has a healthier-looking bed of granulation tissue. The wound dimensions are not significantly different on this side but the wound surface is improved. 12/30/2021: The wound on the right calcaneus has not changed significantly aside from some accumulation of callus. On the left, the open area is smaller and continues [name] have an improved surface. He continues [name] accumulate callus around the wound. He is here for his third application of Vendaje. 01/06/2022: The right calcaneal wound is down [name] just a couple of millimeters. He continues [name] accumulate periwound callus. He unfortunately got his cast wet earlier in the week and his left foot is macerated, resulting in some superficial skin  loss just distal [name] the open wound. The open wound itself, however, is much smaller and has a healthier appearing surface. He is here for his fourth application of Vendaje. 01/13/2022: The right calcaneal wound is about the same. Unfortunately, once again, his cast got wet and his foot is again macerated. This is caused the left calcaneal wound [name] enlarge. He is here for his fifth application of Vendaje. 01/20/2022: The right calcaneal wound is very small. There is some periwound callus accumulation. He purchased a new cast protector last week and this has been effective in avoiding the maceration that has been occurring on the left. The left calcaneal wound is narrower and has a healthy and viable-appearing surface. He is here for his 6 application of Vendaje. Patient History Information obtained from Patient. Family History Cancer - Maternal Grandparents, Diabetes - Father,Paternal Grandparents, Heart Disease - Maternal Grandparents, Hypertension - Father,Paternal Grandparents, Lung Disease - Siblings, No family history of Hereditary Spherocytosis, Kidney Disease, Seizures, Stroke, Thyroid Problems, Tuberculosis. Social History Never smoker, Marital Status - Married, Alcohol Use - Never, Drug Use - No History, Caffeine Use - Daily - tea, soda. Medical History Eyes Denies history of Cataracts, Glaucoma, Optic Neuritis Ear/Nose/Mouth/Throat Denies history of Chronic sinus problems/congestion, Middle ear problems Hematologic/Lymphatic Denies history of Anemia, Hemophilia, Human Immunodeficiency Virus, Lymphedema, Sickle Cell Disease Respiratory Patient has history of Asthma Denies history of Aspiration, Chronic Obstructive Pulmonary Disease (COPD), Pneumothorax, Sleep Apnea, Tuberculosis Cardiovascular Patient has history of Angina - with COVID, Hypertension Denies history of  Arrhythmia, Congestive Heart Failure, Coronary Artery Disease, Deep Vein Thrombosis, Hypotension, Myocardial  Infarction, Peripheral Arterial Disease, Peripheral Venous Disease, Phlebitis, Vasculitis Gastrointestinal Denies history of Cirrhosis , Colitis, Crohnoos, Hepatitis A, Hepatitis B, Hepatitis C Endocrine Denies history of Type I Diabetes, Type II Diabetes Genitourinary Denies history of End Stage Renal Disease Immunological Denies history of Lupus Erythematosus, Raynaudoos, Scleroderma Integumentary (Skin) Denies history of History of Burn Musculoskeletal Denies history of Gout, Rheumatoid Arthritis, Osteoarthritis, Osteomyelitis Neurologic Denies history of Dementia, Neuropathy, Quadriplegia, Paraplegia, Seizure Disorder Oncologic Denies history of Received Chemotherapy, Received Radiation Psychiatric Denies history of Anorexia/bulimia, Confinement Anxiety Hospitalization/Surgery History - COVID PNA 07/22/2019- 11/14/2019. - 03/27/2020 wound debridement/ skin graft. Medical A Surgical History Notes nd Constitutional Symptoms (General Health) COVID PNA 07/22/2019-11/14/2019 [facility] ECMO, foot drop left foot , Genitourinary kidney stone Psychiatric anxiety Objective Constitutional No acute distress.. Vitals Time Taken: 9:45 AM, Height: 69 in, Weight: 280 lbs, BMI: 41.3, Temperature: 98.5 F, Pulse: 80 bpm, Respiratory Rate: 18 breaths/min, Blood Pressure: 118/83 mmHg. Respiratory Normal work of breathing on room air.. General Notes: 01/20/2022: The right calcaneal wound is very small. There is some periwound callus accumulation. The left calcaneal wound is narrower and has a healthy and viable-appearing surface. The periwound skin is in much better condition and is not macerated today. Integumentary (Hair, Skin) Wound #1 status is Open. Original cause of wound was Pressure Injury. The date acquired was: 10/07/2019. The wound has been in treatment 79 weeks. The wound is located on the Right Calcaneus. The wound measures 0.5cm length x 0.2cm width x 0.2cm depth; 0.079cm^2 area and  0.016cm^3 volume. There is Fat Layer (Subcutaneous Tissue) exposed. There is no tunneling or undermining noted. There is a medium amount of serosanguineous drainage noted. The wound margin is thickened. There is large (67-100%) red, pink granulation within the wound bed. There is a small (1-33%) amount of necrotic tissue within the wound bed including Eschar and Adherent Slough. Wound #2 status is Open. Original cause of wound was Pressure Injury. The date acquired was: 10/07/2019. The wound has been in treatment 79 weeks. The wound is located on the Left Calcaneus. The wound measures 2cm length x 0.5cm width x 0.2cm depth; 0.785cm^2 area and 0.157cm^3 volume. There is Fat Layer (Subcutaneous Tissue) exposed. There is no tunneling or undermining noted. There is a medium amount of serosanguineous drainage noted. The wound margin is distinct with the outline attached [name] the wound base. There is small (1-33%) pink, pale granulation within the wound bed. There is a large (67-100%) amount of necrotic tissue within the wound bed including Adherent Slough. Assessment Active Problems ICD-10 Non-pressure chronic ulcer of other part of right foot with fat layer exposed Non-pressure chronic ulcer of other part of left foot with fat layer exposed Procedures Wound #1 Pre-procedure diagnosis of Wound #1 is a Pressure Ulcer located on the Right Calcaneus . There was a Selective/Open Wound Non-Viable Tissue Debridement with a total area of 0.1 sq cm performed by Fredirick Maudlin, MD. With the following instrument(s): Curette [name] remove Non-Viable tissue/material. Material removed includes Callus and Slough and after achieving pain control using Lidocaine 4% T opical Solution. No specimens were taken. A time out was conducted at 10:26, prior [name] the start of the procedure. A Minimum amount of bleeding was controlled with Pressure. The procedure was tolerated well with a pain level of 0 throughout and a pain level of  0 following the procedure. Post Debridement Measurements: 0.5cm length x 0.2cm width x 0.2cm  depth; 0.016cm^3 volume. Post debridement Stage noted as Category/Stage III. Character of Wound/Ulcer Post Debridement is improved. Post procedure Diagnosis Wound #1: Same as Pre-Procedure Wound #2 Pre-procedure diagnosis of Wound #2 is a Pressure Ulcer located on the Left Calcaneus . There was a Excisional Skin/Subcutaneous Tissue Debridement with a total area of 1 sq cm performed by Fredirick Maudlin, MD. With the following instrument(s): Curette [name] remove Non-Viable tissue/material. Material removed includes Callus, Subcutaneous Tissue, and Slough after achieving pain control using Lidocaine 4% T opical Solution. No specimens were taken. A time out was conducted at 10:26, prior [name] the start of the procedure. A Minimum amount of bleeding was controlled with Pressure. The procedure was tolerated well with a pain level of 0 throughout and a pain level of 0 following the procedure. Post Debridement Measurements: 2cm length x 0.5cm width x 0.2cm depth; 0.157cm^3 volume. Post debridement Stage noted as Category/Stage III. Character of Wound/Ulcer Post Debridement is improved. Post procedure Diagnosis Wound #2: Same as Pre-Procedure Plan 01/20/2022: The right calcaneal wound is very small. There is some periwound callus accumulation. The left calcaneal wound is narrower and has a healthy and viable-appearing surface. The periwound skin is in much better condition and is not macerated today. I used a curette [name] debride periwound callus from both wounds. I also debrided some slough off of the left-sided wound. I then applied the Vendaje skin substitute (no charge) and secured it with Adaptic and Steri-Strips. I placed a folded gauze as a bolster. We will reapply his total contact cast. Follow-up in 1 week. Electronic Signature(s) Signed: 01/20/2022 10:56:16 AM By: Fredirick Maudlin MD FACS Entered By: Fredirick Maudlin on 01/20/2022 10:56:16 -------------------------------------------------------------------------------- HxROS Details Patient Name: Date of Service: [name], [name] 01/20/2022 9:30 A M Medical Record Number: [id number] Patient Account Number: [id number] Date of Birth/Sex: Treating RN: [birth year] (46 y.o. [name] Primary Care Provider: [name] Other Clinician: Referring Provider: Treating Provider/Extender: [name] in Treatment: 27 Information Obtained From Patient Constitutional Symptoms (General Health) Medical History: Past Medical History Notes: COVID PNA 07/22/2019-11/14/2019 [facility] ECMO, foot drop left foot , Eyes Medical History: Negative for: Cataracts; Glaucoma; Optic Neuritis Ear/Nose/Mouth/Throat Medical History: Negative for: Chronic sinus problems/congestion; Middle ear problems Hematologic/Lymphatic Medical History: Negative for: Anemia; Hemophilia; Human Immunodeficiency Virus; Lymphedema; Sickle Cell Disease Respiratory Medical History: Positive for: Asthma Negative for: Aspiration; Chronic Obstructive Pulmonary Disease (COPD); Pneumothorax; Sleep Apnea; Tuberculosis Cardiovascular Medical History: Positive for: Angina - with COVID; Hypertension Negative for: Arrhythmia; Congestive Heart Failure; Coronary Artery Disease; Deep Vein Thrombosis; Hypotension; Myocardial Infarction; Peripheral Arterial Disease; Peripheral Venous Disease; Phlebitis; Vasculitis Gastrointestinal Medical History: Negative for: Cirrhosis ; Colitis; Crohns; Hepatitis A; Hepatitis B; Hepatitis C Endocrine Medical History: Negative for: Type I Diabetes; Type II Diabetes Genitourinary Medical History: Negative for: End Stage Renal Disease Past Medical History Notes: kidney stone Immunological Medical History: Negative for: Lupus Erythematosus; Raynauds; Scleroderma Integumentary (Skin) Medical History: Negative for: History of  Burn Musculoskeletal Medical History: Negative for: Gout; Rheumatoid Arthritis; Osteoarthritis; Osteomyelitis Neurologic Medical History: Negative for: Dementia; Neuropathy; Quadriplegia; Paraplegia; Seizure Disorder Oncologic Medical History: Negative for: Received Chemotherapy; Received Radiation Psychiatric Medical History: Negative for: Anorexia/bulimia; Confinement Anxiety Past Medical History Notes: anxiety Immunizations Pneumococcal Vaccine: Received Pneumococcal Vaccination: No Implantable Devices None Hospitalization / Surgery History Type of Hospitalization/Surgery COVID PNA 07/22/2019- 11/14/2019 03/27/2020 wound debridement/ skin graft Family and Social History Cancer: Yes - Maternal Grandparents; Diabetes: Yes - Father,Paternal Grandparents; Heart Disease: Yes - Maternal  Grandparents; Hereditary Spherocytosis: No; Hypertension: Yes - Father,Paternal Grandparents; Kidney Disease: No; Lung Disease: Yes - Siblings; Seizures: No; Stroke: No; Thyroid Problems: No; Tuberculosis: No; Never smoker; Marital Status - Married; Alcohol Use: Never; Drug Use: No History; Caffeine Use: Daily - tea, soda; Financial Concerns: No; Food, Clothing or Shelter Needs: No; Support System Lacking: No; Transportation Concerns: No Electronic Signature(s) Signed: 01/20/2022 10:57:10 AM By: Fredirick Maudlin MD FACS Signed: 01/20/2022 6:39:17 PM By: [name] Entered By: Fredirick Maudlin on 01/20/2022 10:54:04 -------------------------------------------------------------------------------- Total Contact Cast Details Patient Name: Date of Service: [name], [name] 01/20/2022 9:30 A M Medical Record Number: [id number] Patient Account Number: [id number] Date of Birth/Sex: Treating RN: 07-21-1976 (46 y.o. [name] Primary Care Provider: [name] Other Clinician: Referring Provider: Treating Provider/Extender: [name] in Treatment: 64 T Contact  Cast Applied for Wound Assessment: otal Wound #2 Left Calcaneus Performed By: Physician Fredirick Maudlin, MD Post Procedure Diagnosis Same as Pre-procedure Electronic Signature(s) Signed: 01/20/2022 11:37:33 AM By: Fredirick Maudlin MD FACS Signed: 01/20/2022 6:39:17 PM By: [name] Entered By: [name] on 01/20/2022 11:22:18 -------------------------------------------------------------------------------- [place] Details Patient Name: Date of Service: [name], [place]. 01/20/2022 Medical Record Number: [id number] Patient Account Number: [id number] Date of Birth/Sex: Treating RN: February 09, 1976 (46 y.o. [name] Primary Care Provider: [name] Other Clinician: Referring Provider: Treating Provider/Extender: [name] in Treatment: 79 Diagnosis Coding ICD-10 Codes Code Description [phone] Non-pressure chronic ulcer of other part of right foot with fat layer exposed L97.522 Non-pressure chronic ulcer of other part of left foot with fat layer exposed Facility Procedures CPT4 Code: [id number] Description: [place] TISSUE 20 SQ CM/< ICD-10 Diagnosis Description L97.522 Non-pressure chronic ulcer of other part of left foot with fat layer exposed Modifier: Quantity: 1 CPT4 Code: [id number] Description: 37858 - DEBRIDE WOUND 1ST 20 SQ CM OR < ICD-10 Diagnosis Description L97.512 Non-pressure chronic ulcer of other part of right foot with fat layer exposed Modifier: Quantity: 1 Physician Procedures : CPT4 Code Description Modifier [id number] - WC PHYS LEVEL 3 - EST PT 25 ICD-10 Diagnosis Description L97.512 Non-pressure chronic ulcer of other part of right foot with fat layer exposed L97.522 Non-pressure chronic ulcer of other part of left foot  with fat layer exposed Quantity: 1 : [id number] - WC PHYS SUBQ TISS 20 SQ CM ICD-10 Diagnosis Description L97.522 Non-pressure chronic ulcer of other part of left foot with fat layer  exposed Quantity: 1 : [id number] - WC PHYS DEBR WO ANESTH 20 SQ CM ICD-10 Diagnosis Description L97.512 Non-pressure chronic ulcer of other part of right foot with fat layer exposed Quantity: 1 Electronic Signature(s) Signed: 01/20/2022 10:56:43 AM By: Fredirick Maudlin MD FACS Entered By: Fredirick Maudlin on 01/20/2022 10:56:43

## 2022-01-24 ENCOUNTER — Ambulatory Visit: Payer: PPO | Admitting: Behavioral Health

## 2022-01-27 ENCOUNTER — Encounter (HOSPITAL_BASED_OUTPATIENT_CLINIC_OR_DEPARTMENT_OTHER): Payer: PPO | Admitting: General Surgery

## 2022-01-27 DIAGNOSIS — L97512 Non-pressure chronic ulcer of other part of right foot with fat layer exposed: Secondary | ICD-10-CM | POA: Diagnosis not present

## 2022-02-01 ENCOUNTER — Encounter: Payer: Self-pay | Admitting: Behavioral Health

## 2022-02-01 ENCOUNTER — Ambulatory Visit (INDEPENDENT_AMBULATORY_CARE_PROVIDER_SITE_OTHER): Payer: PPO | Admitting: Behavioral Health

## 2022-02-01 DIAGNOSIS — F99 Mental disorder, not otherwise specified: Secondary | ICD-10-CM | POA: Diagnosis not present

## 2022-02-01 DIAGNOSIS — F411 Generalized anxiety disorder: Secondary | ICD-10-CM | POA: Diagnosis not present

## 2022-02-01 DIAGNOSIS — F4321 Adjustment disorder with depressed mood: Secondary | ICD-10-CM | POA: Diagnosis not present

## 2022-02-01 DIAGNOSIS — F331 Major depressive disorder, recurrent, moderate: Secondary | ICD-10-CM | POA: Diagnosis not present

## 2022-02-01 DIAGNOSIS — F39 Unspecified mood [affective] disorder: Secondary | ICD-10-CM | POA: Diagnosis not present

## 2022-02-01 DIAGNOSIS — F5105 Insomnia due to other mental disorder: Secondary | ICD-10-CM | POA: Diagnosis not present

## 2022-02-01 MED ORDER — AUVELITY 45-105 MG PO TBCR: EXTENDED_RELEASE_TABLET | ORAL | 1 refills | Status: DC

## 2022-02-03 ENCOUNTER — Ambulatory Visit (HOSPITAL_BASED_OUTPATIENT_CLINIC_OR_DEPARTMENT_OTHER): Payer: BC Managed Care – PPO | Admitting: General Surgery

## 2022-02-04 ENCOUNTER — Encounter (HOSPITAL_BASED_OUTPATIENT_CLINIC_OR_DEPARTMENT_OTHER): Payer: PPO | Admitting: General Surgery

## 2022-02-04 DIAGNOSIS — L97512 Non-pressure chronic ulcer of other part of right foot with fat layer exposed: Secondary | ICD-10-CM | POA: Diagnosis not present

## 2022-02-04 NOTE — Progress Notes (Signed)
VIRL, COBLE ([id number]) Visit Report for 02/04/2022 Chief Complaint Document Details Patient Name: Date of Service: [name] [place] 02/04/2022 9:30 A M Medical Record Number: [id number] Patient Account Number: [id number] Date of Birth/Sex: Treating RN: 1975-11-04 (46 y.o. [name] Primary Care Provider: Cristie [name] Other Clinician: Referring Provider: Treating Provider/Extender: [name] in Treatment: 73 Information Obtained from: Patient Chief Complaint Bilateral Plantar Foot Ulcers Electronic Signature(s) Signed: 02/04/2022 10:49:02 AM By: Fredirick Maudlin MD FACS Entered By: Fredirick Maudlin on 02/04/2022 10:49:02 -------------------------------------------------------------------------------- Cellular or Tissue Based Product Details Patient Name: Date of Service: [name], [place] 02/04/2022 9:30 A M Medical Record Number: [id number] Patient Account Number: [id number] Date of Birth/Sex: Treating RN: 08-Dec-1975 (46 y.o. [name] Primary Care Provider: Cristie [name] Other Clinician: Referring Provider: Treating Provider/Extender: [name] in Treatment: 40 Cellular or Tissue Based Product Type Wound #1 Right Calcaneus Applied to: Performed By: Physician Fredirick Maudlin, MD Cellular or Tissue Based Product Type: Other Level of Consciousness (Pre-procedure): Awake and Alert Pre-procedure Verification/Time Out Yes - 10:25 Taken: Location: genitalia / hands / feet / multiple digits Wound Size (sq cm): 0.06 Product Size (sq cm): 16 Waste Size (sq cm): 15.2 Waste Reason: not needed,wound size Amount of Product Applied (sq cm): 0.8 Instrument Used: Forceps, Scissors Lot #: [id number] Order #: [id number] Expiration Date: 09/25/2024 Fenestrated: No Reconstituted: No Secured: Yes Secured With: Steri-Strips Dressing Applied: Yes Primary Dressing: adaptic;gauze Procedural Pain: 0 Post Procedural Pain:  0 Response to Treatment: Procedure was tolerated well Level of Consciousness (Post- Awake and Alert procedure): Post Procedure Diagnosis Same as Pre-procedure Notes ** vendaje** Electronic Signature(s) Signed: 02/04/2022 11:01:22 AM By: Fredirick Maudlin MD FACS Signed: 02/04/2022 5:36:57 PM By: [name] Entered By: [name] on 02/04/2022 10:42:56 -------------------------------------------------------------------------------- Cellular or Tissue Based Product Details Patient Name: Date of Service: [name], [place] 02/04/2022 9:30 A M Medical Record Number: [id number] Patient Account Number: [id number] Date of Birth/Sex: Treating RN: [birth year] (46 y.o. [name] Primary Care Provider: Cristie [name] Other Clinician: Referring Provider: Treating Provider/Extender: [name] in Treatment: 16 Cellular or Tissue Based Product Type Wound #2 Left Calcaneus Applied to: Performed By: Physician Fredirick Maudlin, MD Cellular or Tissue Based Product Type: Other Level of Consciousness (Pre-procedure): Awake and Alert Pre-procedure Verification/Time Out Yes - 10:25 Taken: Location: genitalia / hands / feet / multiple digits Wound Size (sq cm): 1.2 Product Size (sq cm): 16 Waste Size (sq cm): 0.8 Waste Reason: not needed,wound size Amount of Product Applied (sq cm): 15.2 Instrument Used: Forceps, Scissors Lot #: [id number] Order #: 7 Expiration Date: 09/25/2024 Fenestrated: No Reconstituted: No Secured: Yes Secured With: Steri-Strips Dressing Applied: Yes Primary Dressing: adaptic;gauze Procedural Pain: 0 Post Procedural Pain: 0 Response to Treatment: Procedure was tolerated well Level of Consciousness (Post- Awake and Alert procedure): Post Procedure Diagnosis Same as Pre-procedure Notes *** Vendaje*** Electronic Signature(s) Signed: 02/04/2022 11:01:22 AM By: Fredirick Maudlin MD FACS Signed: 02/04/2022 5:36:57 PM By:  [name] Entered By: [name] on 02/04/2022 10:43:14 -------------------------------------------------------------------------------- Debridement Details Patient Name: Date of Service: [name], [place] E. 02/04/2022 9:30 A M Medical Record Number: [id number] Patient Account Number: [id number] Date of Birth/Sex: Treating RN: [birth year] (46 y.o. [name] Primary Care Provider: Cristie [name] Other Clinician: Referring Provider: Treating Provider/Extender: [name] in Treatment: 67 Debridement Performed for Assessment: Wound #1 Right Calcaneus Performed By: Physician Fredirick Maudlin,  MD Debridement Type: Debridement Level of Consciousness (Pre-procedure): Awake and Alert Pre-procedure Verification/Time Out Yes - 10:25 Taken: Start Time: 10:25 Pain Control: Lidocaine 4% T opical Solution T Area Debrided (L x W): otal 0.3 (cm) x 0.2 (cm) = 0.06 (cm) Tissue and other material debrided: Non-Viable, Callus, Eschar Level: Non-Viable Tissue Debridement Description: Selective/Open Wound Instrument: Curette Bleeding: Minimum Hemostasis Achieved: Pressure End Time: 10:27 Procedural Pain: 0 Post Procedural Pain: 0 Response to Treatment: Procedure was tolerated well Level of Consciousness (Post- Awake and Alert procedure): Post Debridement Measurements of Total Wound Length: (cm) 0.3 Stage: Category/Stage III Width: (cm) 0.2 Depth: (cm) 0.2 Volume: (cm) 0.009 Character of Wound/Ulcer Post Debridement: Improved Post Procedure Diagnosis Same as Pre-procedure Electronic Signature(s) Signed: 02/04/2022 11:01:22 AM By: Fredirick Maudlin MD FACS Signed: 02/04/2022 5:36:57 PM By: [name] Entered By: [name] on 02/04/2022 10:29:34 -------------------------------------------------------------------------------- HPI Details Patient Name: Date of Service: [name], [name] 02/04/2022 9:30 A M Medical Record Number:  [id number] Patient Account Number: [id number] Date of Birth/Sex: Treating RN: 1976-03-07 (46 y.o. [name] Primary Care Provider: Cristie [name] Other Clinician: Referring Provider: Treating Provider/Extender: [name] in Treatment: 56 History of Present Illness HPI Description: Wounds are12/03/2020 upon evaluation today patient presents for initial inspection here in our clinic concerning issues he has been having with the bottoms of his feet bilaterally. He states these actually occurred as wounds when he was hospitalized for 5 months secondary to Covid. He was apparently with tilting bed where he was in an upright position quite frequently and apparently this occurred in some way shape or form during that time. Fortunately there is no sign of active infection at this time. No fevers, chills, nausea, vomiting, or diarrhea. With that being said he still has substantial wounds on the plantar aspects of his feet Theragen require quite a bit of work to get these to heal. He has been using Santyl currently though that is been problematic both in receiving the medication as well as actually paid for it as it is become quite expensive. Prior to the experience with Covid the patient really did not have any major medical problems other than hypertension he does have some mild generalized weakness following the Covid experience. 07/22/2020 on evaluation today patient appears to be doing okay in regard to his foot ulcers I feel like the wound beds are showing signs of better improvement that I do believe the Iodoflex is helping in this regard. With that being said he does have a lot of drainage currently and this is somewhat blue/green in nature which is consistent with Pseudomonas. I do think a culture today would be appropriate for [place] to evaluate and see if that is indeed the case I would likely start him on antibiotic orally as well he is not allergic to Cipro knows of no  issues he has had in the past 12/21; patient was admitted to the clinic earlier this month with bilateral presumed pressure ulcers on the bottom of his feet apparently related to excessive pressure from a tilt table arrangement in the intensive care unit. Patient relates this to being on ECMO but I am not really sure that is exactly related to that. I must say I have never seen anything like this. He has fairly extensive full-thickness wounds extending from his heel towards his midfoot mostly centered laterally. There is already been some healing distally. He does not appear to have an arterial issue. He has been using  gentamicin to the wound surfaces with Iodoflex to help with ongoing debridement 1/6; this is a patient with pressure ulcers on the bottom of his feet related to excessive pressure from a standing position in the intensive care unit. He is complaining of a lot of pain in the right heel. He is not a diabetic. 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 We have been using Iodoflex to help prepare the surfaces of both wounds for an advanced treatment product. He is nonambulatory spending most of his time in a wheelchair I have asked him not to propel the wheelchair with his heels 1/13; in general his wounds look better not much surface area change we have been using Iodoflex as of last week. I did an x-ray of the right heel as the patient was complaining of pain. I had some thoughts about a stress fracture perhaps Achilles tendon problems however what it showed was erosive changes along the inferior aspect of the calcaneus he now has a MRI booked for 1/20. 1/20; in general his wounds continue to be better. Some improvement in the large narrow areas proximally in his foot. He is still complaining of pain in the right heel and tenderness in certain areas of this wound. His MRI is tonight. I am not just looking for osteomyelitis that was brought up on the x-ray I am  wondering about stress fractures, tendon ruptures etc. He has no such findings on the left. Also noteworthy is that the patient had critical illness neuropathy and some of the discomfort may be actual improvement in nerve function I am just not sure. These wounds were initially in the setting of severe critical illness related to COVID-19. He was put in a standing position. He may have also been on pressors at the point contributing to tissue ischemia. By his description at some point these wounds were grossly necrotic extending proximally up into the Achilles part of his heel. I do not know that I have ever really seen pictures of them like this although they may exist in epic We have ordered Tri layer Oasis. I am trying to stimulate some granulation in these areas. This is of course assuming the MRI is negative for infection 1/27; since the patient was last here he saw Dr. Juleen China of infectious disease. He is planned for vancomycin and ceftriaxone. Prior operative culture grew MSSA. Also ordered baseline lab work. He also ordered arterial studies although the ABIs in our clinic were normal as well as his clinical exam these were normal I do not think he needs to see vascular surgery. His ABIs at the PTA were 1.22 in the right triphasic waveforms with a normal TBI of 1.15 on the left ABI of 1.22 with triphasic waveforms and a normal TBI of 1.08. Finally he saw Dr. Amalia Hailey who will follow him in for 2 months. At this point I do not think he felt that he needed a procedure on the right calcaneal bone. Dr. Juleen China is elected for broad-spectrum antibiotic The patient is still having pain in the right heel. He walks with a walker 2/3; wounds are generally smaller. He is tolerating his IV antibiotics. I believe this is vancomycin and ceftriaxone. We are still waiting for Oasis burn in terms of his out-of-pocket max which he should be meeting soon given the IV antibiotics, MRIs etc. I have asked him to check  in on this. We are using silver collagen in the meantime the wounds look better 2/10; tolerating IV  vancomycin and Rocephin. We are waiting to apply for Oasis. Although I am not really sure where he is in his out-of-pocket max. 2/17 started the first application of Oasis trilayer. Still on antibiotics. The wounds have generally look better. The area on the left has a little more surface slough requiring debridement 6/28; second application of Oasis trilayer. The wound surface granulation is generally look better. The area on the left with undermining laterally I think is come in a bit. 10/08/2020 upon evaluation today patient is here today for [employer] application #3. Fortunately he seems to be doing extremely well with regard to this and we are seeing a lot of new epithelial growth which is great news. Fortunately there is no signs of active infection at this time. 10/16/2020 upon evaluation today patient appears to be doing well with regard to his foot ulcers. Do believe the Oasis has been of benefit for him. I do not see any signs of infection right now which is great news and I think that he has a lot of new epithelial growth which is great to see as well. The patient is very pleased to hear all of this. I do think we can proceed with the Oasis trilayer #4 today. 3/18; not as much improvement in these areas on his heels that I was hoping. I did reapply trilateral Oasis today the tissue looks healthier but not as much fill in as I was hoping. 3/25; better looking today I think this is come in a bit the tissue looks healthier. Triple layer Oasis reapplied #6 4/1; somewhat better looking definitely better looking surface not as much change in surface area as I was hoping. He may be spending more time [place] on days then he needs to although he does have heel offloading boots. Triple layer Oasis reapplied #7 4/7; unfortunately apparently [facility] will not approve any further Oasis  which is unfortunate since the patient did respond nicely both in terms of the condition of the wound bed as well as surface area. There is still some drainage coming from the wound but not a lot there does not appear to be any infection 4/15; we have been using Hydrofera Blue. He continues to have nice rims of epithelialization on the right greater than the left. The left the epithelialization is coming from the tip of his heel. There is moderate drainage. In this that concerns me about a total contact cast. There is no evidence of infection 4/29; patient has been using Hydrofera Blue with dressing changes. He has no complaints or issues today. 5/5; using Hydrofera Blue. I actually think that he looks marginally better than the last time I saw this 3 weeks ago. There are rims of epithelialization on the left thumb coming from the medial side on the right. Using Hydrofera Blue 5/12; using Hydrofera Blue. These continue to make improvements in surface area. His drainage was not listed as severe I therefore went ahead and put a cast on the left foot. Right foot we will continue to dress his previous 5/16; back for first total contact cast change. He did not tolerate this particularly well cast injury on the anterior tibia among other issues. Difficulty sleeping. I talked him about this in some detail and afterwards is elected to continue. I told him I would like to have a cast on for 3 weeks to see if this is going to help at all. I think he agreed 5/19; I think the wound is better.  There is no tunneling towards his midfoot. The undermining medially also looks better. He has a rim of new skin distally. I think we are making progress here. The area on the left also continues to look somewhat better to me using Hydrofera Blue. He has a list of complaints about the cast but none of them seem serious 5/26; patient presents for 1 week follow-up. He has been using a total contact cast and tolerating this  well. Hydrofera Blue is the main dressing used. He denies signs of infection. 6/2 Hydrofera Blue total contact cast on the left. These were large ulcers that formed in intensive care unit where the patient was recovering from [place]. May have had something to do with being ventilated in an upright positiono Pressors etc. We have been able to get the areas down considerably and a viable surface. There is some epithelialization in both sides. Note made of drainage 6/9; changed to [facility] last time because of drainage. He arrives with better looking surfaces and dimensions on the left than the right. Paradoxically the right actually probes more towards his midfoot the left is largely close down but both of these look improved. Using a total contact cast on the left 6/16; complex wounds on his bilateral plantar heels which were initially pressure injury from a stay in the ICU with COVID. We have been using silver alginate most recently. His dimensions of come in quite dramatically however not recently. We have been putting the left foot in a total contact cast 6/23; complex wounds on the bilateral plantar heels. I been putting the left in the cast paradoxically the area on the right is the one that is going towards closure at a faster rate. Quite a bit of drainage on the left. The patient went to see Dr. Amalia Hailey who said he was going to standby for skin grafts. I had actually considered sending him for skin grafts however he would be mandatorily off his feet for a period of weeks to months. I am thinking that the area on the right is going to close on its own the area on the left has been more stubborn even though we have him in a total contact cast 6/30; took him out of a total contact cast last week is the right heel seem to be making better progress than the left where I was placing the cast. We are using silver alginate. Both wounds are smaller right greater than left 7/12; both wounds look as  though they are making some progress. We are using silver alginate. Heel offloading boots 7/26; very gradual progress especially on the right. Using silver alginate. He is wearing heel offloading boots 8/18; he continues to close these wounds down very gradually. Using silver alginate. The problem polymen being definitive about this is areas of what appears to be callus around the margins. This is not a 100% of the area but certainly sizable especially on the right 9/1; bilateral plantar feet wounds secondary to prolonged pressure while being ventilated for COVID-19 in an upright position. Essentially pressure ulcers on the bottom of his feet. He is made substantial progress using silver alginate. 9/14; bilateral plantar feet wounds secondary to prolonged pressure. Making progress using silver alginate. 9/29 bilateral plantar feet wounds secondary to prolonged pressure. I changed him to Iodoflex last week. MolecuLight showing reddened blush fluorescence 10/11; patient presents for follow-up. He has no issues or complaints today. He denies signs of infection. He continues to use Iodoflex and antibiotic ointment  to the wound beds. 10/27; 2-week follow-up. No evidence of infection. He has callus and thick dry skin around the wound margins we have been using Iodoflex and Bactroban which was in response to a moderate left MolecuLight reddish blush fluorescence. 11/10; 2-week follow-up. Wound margins again have thick callus however the measurements of the actual wound sites are a lot smaller. Everything looks reasonably healthy here. We have been using Iodoflex He was approved for prime matrix but I have elected to delay this given the improvement in the surface area. Hopefully I will not regret that decision as were getting close to the end of the year in terms of insurance payment 12/8; 2-week follow-up. Wounds are generally smaller in size. These were initially substantial wounds extending into the  forefoot all the way into the heel on the bilateral plantar feet. They are now both located on the plantar heel distal aspect both of these have a lot of callus around the wounds I used a #5 curette to remove this on the right and the left also some subcutaneous debris to try and get the wound edges were using Iodoflex. He has heel offloading shoe 12/22; 2-week follow-up. Not really much improvement. He has thick callus around the outer edges of both wounds. I remove this there is some nonviable subcutaneous tissue as well. We have been using Iodoflex. Her intake nurse and myself spontaneously thought of a total contact cast I went back in May. At that time we really were not seeing much of an improvement with a cast although the wound was in a much different situation I would like to retry this in 2 weeks and I discussed this with the patient 08/12/2021; the patient has had some improvement with the Iodoflex. The the area on the left heel plantar more improved than the right. I had to put him in a total contact cast on the left although I decided to put that off for 2 weeks. I am going to change his primary dressing to silver collagen. I think in both areas he has had some improvement most of the healing seems to be more proximal in the heel. The wounds are in the mid aspect. A lot of thick callus on the right heel however. 1/19; we are using silver collagen on both plantar heel areas. He has had some improvement today. The left did not require any debridement. He still had some eschar on the right that was debrided but both seem to have contracted. I did not put it total contact cast on him today 2/2 we have been using silver collagen. The area on the right plantar heel has areas that appear to be epithelialized interspersed with dry flaking callus and dry skin. I removed this. This really looks better than on the other side. On the left still a large area with raised edges and debris on the  surface. 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 He has no issues or complaints today. 2/9; not much change in the left foot wound with 1 week of a cast we are using silver collagen. Silver collagen on the right side. The right side has been the better wound surface. We will reapply the total contact cast on the left 2/16; not much improvement on either side I been using silver collagen with a total contact cast on the left. I'm changing  the [facility] Blue still with a total contact cast on the left 2/23; some improvement on both sides. Disappointing that he has thick callus around the area that we are putting in a total contact cast on the left. We've been using Hydrofera Blue on both wound areas. This is a man who at essentially pressure ulcers in addition to ischemia caused by medications to support his blood pressure (pressors) in the ICU. He was being ventilated in the standing position for severe Covid. A Shiley the wounds extended across his entire foot but are now localized to his plantar heels bilaterally. We have made progress however neither areas healed. I continue to think the total contact cast is helped albeit painstakingly slowly. He has never wanted a plastic surgery consult although I don't know that they would be interested in grafting in area in this location. 10/07/2021: Continued improvement bilaterally. There is still some callus around the left wound, despite the total contact cast. He has some increased pain in his right midfoot around 1 particular area. This has been painful in the past but seems to be a little bit worse. When his cast was removed today, there was an area on the heel of the left foot that looks a bit macerated. He is also complaining of pain in his left thigh and hip which he thinks is secondary to the limb length discrepancy  caused by the cast. 10/14/2021: He continues to improve. A little bit less callus around the left wound. He continues to endorse pain in his right midfoot, but this is not as significant as it was last week. The maceration on his left heel is improved. 10/21/2021: Continued improvement to both wounds. The maceration on his left heel is no longer evident. Less callus bilaterally. Epithelialization progressing. 10/28/2021: Significant improvement this week. The right sided wound is nearly closed with just a small open area at the middle. No maceration seen on the left heel. Continued epithelialization on both sides. No concern for infection. 11/04/2021: T oday, the wounds were measured a little bit differently and come out as larger, but I actually think they are about the same to potentially even smaller, particularly on the left. He continues to accumulate some callus on the right. 11/11/2021: T oday, the patient is expressing some concern that the left wound, despite being in the total contact cast, is not progressing at the same rate as the 1 on the right. He is interested in trying a week without the cast to see how the wound does. The wounds are roughly the same size as last week, with the right perhaps being a little bit smaller. He continues to build up callus on both sites. 11/18/2021: Last week, I permitted the patient to go without his total contact cast, just to see if the cast was really making any difference. Today, both wounds have deteriorated to some extent, suggesting that the cast is providing benefit, at least on the left. Both are larger and have accumulated callus, slough, and other debris. 11/26/2021: I debrided both wounds quite aggressively last week in an effort to stimulate the healing cascade. This appears to have been effective as the left sided wound is a full centimeter shorter in length. Although the right was measured slightly larger, on inspection, it looks as though an area of  epithelialized tissue was included in the measurements. We have been using PolyMem Ag on the wound surfaces with a total contact cast to the left. 12/02/2021: It appears that the intake  personnel are including epithelialized tissue in his wound measurements; the right wound is almost completely epithelialized; there is just a crater at the proximal midfoot with some open areas. On the left, he has built up some callus, but the overall wound surface looks good. There is some senescent skin around the wound margin. He has been in PolyMem Ag bilaterally with a total contact cast on the left. 12/09/2021: The right wound is nearly closed; there is just a small open area at the mid calcaneus. On the left, the wound is smaller with minimal callus buildup. No significant drainage. 12/16/2021: The right calcaneal wound remains minimally open at the mid calcaneus; the rest has epithelialized. On the left, the wound is also a little bit smaller. There is some senescent tissue on the periphery. He is getting his first application of a trial skin substitute called Vendaje today. 12/23/2021: The wound on his right calcaneus is nearly closed; there is just a small area at the most distal aspect of the calcaneus that is open. On the left, the area where we applied to the skin substitute has a healthier-looking bed of granulation tissue. The wound dimensions are not significantly different on this side but the wound surface is improved. 12/30/2021: The wound on the right calcaneus has not changed significantly aside from some accumulation of callus. On the left, the open area is smaller and continues to have an improved surface. He continues to accumulate callus around the wound. He is here for his third application of Vendaje. 01/06/2022: The right calcaneal wound is down to just a couple of millimeters. He continues to accumulate periwound callus. He unfortunately got his cast wet earlier in the week and his left foot is  macerated, resulting in some superficial skin loss just distal to the open wound. The open wound itself, however, is much smaller and has a healthier appearing surface. He is here for his fourth application of Vendaje. 01/13/2022: The right calcaneal wound is about the same. Unfortunately, once again, his cast got wet and his foot is again macerated. This is caused the left calcaneal wound to enlarge. He is here for his fifth application of Vendaje. 01/20/2022: The right calcaneal wound is very small. There is some periwound callus accumulation. He purchased a new cast protector last week and this has been effective in avoiding the maceration that has been occurring on the left. The left calcaneal wound is narrower and has a healthy and viable-appearing surface. He is here for his 6 application of Vendaje. 01/27/2022: The right calcaneal wound is down to just a pinhole. There is some periwound slough and callus. On the left, the wound is narrower and shorter by about a centimeter. The surface is robust and viable-appearing. Unfortunately, the rep for the trial skin substitute product did not provide any for [place] to use today. 02/04/2022: The right calcaneal wound remains unchanged. There is more accumulated callus. On the left, although the intake nurse measured it a little bit longer, it looks about the same to me. The surface has a layer of slough, but underneath this, there is good granulation tissue. Electronic Signature(s) Signed: 02/04/2022 10:49:58 AM By: Fredirick Maudlin MD FACS Entered By: Fredirick Maudlin on 02/04/2022 10:49:58 -------------------------------------------------------------------------------- Physical Exam Details Patient Name: Date of Service: [name], [name] 02/04/2022 9:30 A M Medical Record Number: [id number] Patient Account Number: [id number] Date of Birth/Sex: Treating RN: Jan 05, 1976 (46 y.o. [name] Primary Care Provider: Cristie [name] Other  Clinician: Referring Provider: Treating  Provider/Extender: [name] in Treatment: 34 Constitutional Slightly hypertensive. . . . No acute distress.Marland Kitchen Respiratory Normal work of breathing on room air.. Notes 02/04/2022: The right calcaneal wound remains unchanged. There is more accumulated callus. On the left, although the intake nurse measured it a little bit longer, it looks about the same to me. The surface has a layer of slough, but underneath this, there is good granulation tissue. Electronic Signature(s) Signed: 02/04/2022 10:50:37 AM By: Fredirick Maudlin MD FACS Entered By: Fredirick Maudlin on 02/04/2022 10:50:37 -------------------------------------------------------------------------------- Physician Orders Details Patient Name: Date of Service: [name], [place] 02/04/2022 9:30 A M Medical Record Number: [id number] Patient Account Number: [id number] Date of Birth/Sex: Treating RN: [birth year] (46 y.o. [name] Primary Care Provider: Cristie [name] Other Clinician: Referring Provider: Treating Provider/Extender: [name] in Treatment: 31 Verbal / Phone Orders: No Diagnosis Coding ICD-10 Coding Code Description L97.512 Non-pressure chronic ulcer of other part of right foot with fat layer exposed L97.522 Non-pressure chronic ulcer of other part of left foot with fat layer exposed Follow-up Appointments ppointment in 1 week. - Dr [name] - Room 3 02/10/22 at 9:30am and 7/12 (Wednesday) at 9:30am Return A Cellular or Tissue Based Products Cellular or Tissue Based Product Type: - Vendaje #7 (trial) ( Applied to Right and Left heels) Cellular or Tissue Based Product applied to wound bed, secured with steri-strips, cover with A daptic or Mepitel. (DO NOT REMOVE). Bathing/ Shower/ Hygiene May shower with protection but do not get wound dressing(s) wet. - Cover with cast protector (can purchase cast protector at [facility] or  [facility] ) Edema Control - Lymphedema / SCD / Other Bilateral Lower Extremities Elevate legs to the level of the heart or above for 30 minutes daily and/or when sitting, a frequency of: - throughout the day Avoid standing for long periods of time. Moisturize legs daily. - right leg and foot every night. Off-Loading Total Contact Cast to Left Lower Extremity Wedge shoe to: - Globoped Shoe to right foot and left foot when not in TCC Other: - keep pressure off of the bottom of your feet Additional Orders / Instructions Follow Nutritious Diet Wound Treatment Wound #1 - Calcaneus Wound Laterality: Right Cleanser: Normal Saline (Generic) Every Other Day/30 Days Discharge Instructions: Cleanse the wound with Normal Saline prior to applying a clean dressing using gauze sponges, not tissue or cotton balls. Cleanser: Wound Cleanser (Generic) Every Other Day/30 Days Discharge Instructions: Cleanse the wound with wound cleanser prior to applying a clean dressing using gauze sponges, not tissue or cotton balls. Prim Dressing: VENDAJE ary Every Other Day/30 Days Secondary Dressing: Zetuvit Plus 4x8 in (DME) (Generic) Every Other Day/30 Days Discharge Instructions: Apply over primary dressing as directed. Secured With: Elastic Bandage 4 inch (ACE bandage) (DME) (Generic) Every Other Day/30 Days Discharge Instructions: Secure with ACE bandage as directed. Secured With: [employer], 4.5x3.1 (in/yd) (DME) (Generic) Every Other Day/30 Days Discharge Instructions: Secure with Kerlix as directed. Wound #2 - Calcaneus Wound Laterality: Left Cleanser: Normal Saline (Generic) 1 x Per Week/30 Days Discharge Instructions: Cleanse the wound with Normal Saline prior to applying a clean dressing using gauze sponges, not tissue or cotton balls. Cleanser: Wound Cleanser 1 x Per Week/30 Days Discharge Instructions: Cleanse the wound with wound cleanser prior to applying a clean dressing using gauze sponges,  not tissue or cotton balls. Peri-Wound Care: Zinc Oxide Ointment 30g tube 1 x Per Week/30 Days Discharge Instructions: Apply Zinc Oxide  to periwound with each dressing change Prim Dressing: VENDAJE ary 1 x Per Week/30 Days Secondary Dressing: Optifoam Non-Adhesive Dressing, 4x4 in (DME) (Generic) 1 x Per Week/30 Days Discharge Instructions: Apply over primary dressing as directed. Secondary Dressing: Zetuvit Plus 4x8 in (DME) (Generic) 1 x Per Week/30 Days Discharge Instructions: Apply over primary dressing as directed. Secured With: [employer], 4.5x3.1 (in/yd) (DME) (Generic) 1 x Per Week/30 Days Discharge Instructions: Secure with Kerlix as directed. Secured With: 33M Medipore H Soft Cloth Surgical T ape, 4 x 10 (in/yd) (DME) (Generic) 1 x Per Week/30 Days Discharge Instructions: Secure with tape as directed. Electronic Signature(s) Signed: 02/04/2022 12:20:34 PM By: Fredirick Maudlin MD FACS Signed: 02/04/2022 5:36:57 PM By: [name] Previous Signature: 02/04/2022 11:01:22 AM Version By: Fredirick Maudlin MD FACS Previous Signature: 02/04/2022 10:51:06 AM Version By: Fredirick Maudlin MD FACS Entered By: [name] on 02/04/2022 11:17:13 -------------------------------------------------------------------------------- Problem List Details Patient Name: Date of Service: [name], [place] 02/04/2022 9:30 A M Medical Record Number: [id number] Patient Account Number: [id number] Date of Birth/Sex: Treating RN: Nov 21, 1975 (46 y.o. [name] Primary Care Provider: Cristie [name] Other Clinician: Referring Provider: Treating Provider/Extender: [name] in Treatment: 7 Active Problems ICD-10 Encounter Code Description Active Date MDM Diagnosis L97.512 Non-pressure chronic ulcer of other part of right foot with fat layer exposed 09/03/2020 No Yes L97.522 Non-pressure chronic ulcer of other part of left foot with fat layer exposed 09/03/2020  No Yes Inactive Problems ICD-10 Code Description Active Date Inactive Date L89.893 Pressure ulcer of other site, stage 3 07/15/2020 07/15/2020 M62.81 Muscle weakness (generalized) 07/15/2020 07/15/2020 I10 Essential (primary) hypertension 07/15/2020 07/15/2020 M86.171 Other acute osteomyelitis, right ankle and foot 09/03/2020 09/03/2020 Resolved Problems Electronic Signature(s) Signed: 02/04/2022 10:47:32 AM By: Fredirick Maudlin MD FACS Entered By: Fredirick Maudlin on 02/04/2022 10:47:32 -------------------------------------------------------------------------------- Progress Note Details Patient Name: Date of Service: [name], [place] 02/04/2022 9:30 A M Medical Record Number: [id number] Patient Account Number: [id number] Date of Birth/Sex: Treating RN: 09-15-1975 (46 y.o. [name] Primary Care Provider: Cristie [name] Other Clinician: Referring Provider: Treating Provider/Extender: [name] in Treatment: 35 Subjective Chief Complaint Information obtained from Patient Bilateral Plantar Foot Ulcers History of Present Illness (HPI) Wounds are12/03/2020 upon evaluation today patient presents for initial inspection here in our clinic concerning issues he has been having with the bottoms of his feet bilaterally. He states these actually occurred as wounds when he was hospitalized for 5 months secondary to Covid. He was apparently with tilting bed where he was in an upright position quite frequently and apparently this occurred in some way shape or form during that time. Fortunately there is no sign of active infection at this time. No fevers, chills, nausea, vomiting, or diarrhea. With that being said he still has substantial wounds on the plantar aspects of his feet Theragen require quite a bit of work to get these to heal. He has been using Santyl currently though that is been problematic both in receiving the medication as well as actually paid for it as it is  become quite expensive. Prior to the experience with Covid the patient really did not have any major medical problems other than hypertension he does have some mild generalized weakness following the Covid experience. 07/22/2020 on evaluation today patient appears to be doing okay in regard to his foot ulcers I feel like the wound beds are showing signs of better improvement that I do believe the  Iodoflex is helping in this regard. With that being said he does have a lot of drainage currently and this is somewhat blue/green in nature which is consistent with Pseudomonas. I do think a culture today would be appropriate for [place] to evaluate and see if that is indeed the case I would likely start him on antibiotic orally as well he is not allergic to Cipro knows of no issues he has had in the past 12/21; patient was admitted to the clinic earlier this month with bilateral presumed pressure ulcers on the bottom of his feet apparently related to excessive pressure from a tilt table arrangement in the intensive care unit. Patient relates this to being on ECMO but I am not really sure that is exactly related to that. I must say I have never seen anything like this. He has fairly extensive full-thickness wounds extending from his heel towards his midfoot mostly centered laterally. There is already been some healing distally. He does not appear to have an arterial issue. He has been using gentamicin to the wound surfaces with Iodoflex to help with ongoing debridement 1/6; this is a patient with pressure ulcers on the bottom of his feet related to excessive pressure from a standing position in the intensive care unit. He is complaining of a lot of pain in the right heel. He is not a diabetic. 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 We have been using Iodoflex to help prepare the surfaces of both wounds for an advanced treatment product. He is nonambulatory spending most of his time in a  wheelchair I have asked him not to propel the wheelchair with his heels 1/13; in general his wounds look better not much surface area change we have been using Iodoflex as of last week. I did an x-ray of the right heel as the patient was complaining of pain. I had some thoughts about a stress fracture perhaps Achilles tendon problems however what it showed was erosive changes along the inferior aspect of the calcaneus he now has a MRI booked for 1/20. 1/20; in general his wounds continue to be better. Some improvement in the large narrow areas proximally in his foot. He is still complaining of pain in the right heel and tenderness in certain areas of this wound. His MRI is tonight. I am not just looking for osteomyelitis that was brought up on the x-ray I am wondering about stress fractures, tendon ruptures etc. He has no such findings on the left. Also noteworthy is that the patient had critical illness neuropathy and some of the discomfort may be actual improvement in nerve function I am just not sure. These wounds were initially in the setting of severe critical illness related to COVID-19. He was put in a standing position. He may have also been on pressors at the point contributing to tissue ischemia. By his description at some point these wounds were grossly necrotic extending proximally up into the Achilles part of his heel. I do not know that I have ever really seen pictures of them like this although they may exist in epic We have ordered Tri layer Oasis. I am trying to stimulate some granulation in these areas. This is of course assuming the MRI is negative for infection 1/27; since the patient was last here he saw Dr. Juleen China of infectious disease. He is planned for vancomycin and ceftriaxone. Prior operative culture grew MSSA. Also ordered baseline lab work. He also ordered arterial studies although the ABIs in  our clinic were normal as well as his clinical exam these were normal I do not  think he needs to see vascular surgery. His ABIs at the [facility] were 1.22 in the right triphasic waveforms with a normal TBI of 1.15 on the left ABI of 1.22 with triphasic waveforms and a normal TBI of 1.08. Finally he saw Dr. Amalia Hailey who will follow him in for 2 months. At this point I do not think he felt that he needed a procedure on the right calcaneal bone. Dr. Juleen China is elected for broad-spectrum antibiotic The patient is still having pain in the right heel. He walks with a walker 2/3; wounds are generally smaller. He is tolerating his IV antibiotics. I believe this is vancomycin and ceftriaxone. We are still waiting for Oasis burn in terms of his out-of-pocket max which he should be meeting soon given the IV antibiotics, MRIs etc. I have asked him to check in on this. We are using silver collagen in the meantime the wounds look better 2/10; tolerating IV vancomycin and Rocephin. We are waiting to apply for Oasis. Although I am not really sure where he is in his out-of-pocket max. 2/17 started the first application of Oasis trilayer. Still on antibiotics. The wounds have generally look better. The area on the left has a little more surface slough requiring debridement 0/24; second application of Oasis trilayer. The wound surface granulation is generally look better. The area on the left with undermining laterally I think is come in a bit. 10/08/2020 upon evaluation today patient is here today for [employer] application #3. Fortunately he seems to be doing extremely well with regard to this and we are seeing a lot of new epithelial growth which is great news. Fortunately there is no signs of active infection at this time. 10/16/2020 upon evaluation today patient appears to be doing well with regard to his foot ulcers. Do believe the Oasis has been of benefit for him. I do not see any signs of infection right now which is great news and I think that he has a lot of new epithelial growth which is  great to see as well. The patient is very pleased to hear all of this. I do think we can proceed with the Oasis trilayer #4 today. 3/18; not as much improvement in these areas on his heels that I was hoping. I did reapply trilateral Oasis today the tissue looks healthier but not as much fill in as I was hoping. 3/25; better looking today I think this is come in a bit the tissue looks healthier. Triple layer Oasis reapplied #6 4/1; somewhat better looking definitely better looking surface not as much change in surface area as I was hoping. He may be spending more time [place] on days then he needs to although he does have heel offloading boots. Triple layer Oasis reapplied #7 4/7; unfortunately apparently [facility] will not approve any further Oasis which is unfortunate since the patient did respond nicely both in terms of the condition of the wound bed as well as surface area. There is still some drainage coming from the wound but not a lot there does not appear to be any infection 4/15; we have been using Hydrofera Blue. He continues to have nice rims of epithelialization on the right greater than the left. The left the epithelialization is coming from the tip of his heel. There is moderate drainage. In this that concerns me about a total contact cast. There is  no evidence of infection 4/29; patient has been using Hydrofera Blue with dressing changes. He has no complaints or issues today. 5/5; using Hydrofera Blue. I actually think that he looks marginally better than the last time I saw this 3 weeks ago. There are rims of epithelialization on the left thumb coming from the medial side on the right. Using Hydrofera Blue 5/12; using Hydrofera Blue. These continue to make improvements in surface area. His drainage was not listed as severe I therefore went ahead and put a cast on the left foot. Right foot we will continue to dress his previous 5/16; back for first total contact cast change.  He did not tolerate this particularly well cast injury on the anterior tibia among other issues. Difficulty sleeping. I talked him about this in some detail and afterwards is elected to continue. I told him I would like to have a cast on for 3 weeks to see if this is going to help at all. I think he agreed 5/19; I think the wound is better. There is no tunneling towards his midfoot. The undermining medially also looks better. He has a rim of new skin distally. I think we are making progress here. The area on the left also continues to look somewhat better to me using Hydrofera Blue. He has a list of complaints about the cast but none of them seem serious 5/26; patient presents for 1 week follow-up. He has been using a total contact cast and tolerating this well. Hydrofera Blue is the main dressing used. He denies signs of infection. 6/2 Hydrofera Blue total contact cast on the left. These were large ulcers that formed in intensive care unit where the patient was recovering from [place]. May have had something to do with being ventilated in an upright positiono Pressors etc. We have been able to get the areas down considerably and a viable surface. There is some epithelialization in both sides. Note made of drainage 6/9; changed to [facility] last time because of drainage. He arrives with better looking surfaces and dimensions on the left than the right. Paradoxically the right actually probes more towards his midfoot the left is largely close down but both of these look improved. Using a total contact cast on the left 6/16; complex wounds on his bilateral plantar heels which were initially pressure injury from a stay in the ICU with COVID. We have been using silver alginate most recently. His dimensions of come in quite dramatically however not recently. We have been putting the left foot in a total contact cast 6/23; complex wounds on the bilateral plantar heels. I been putting the left in the cast  paradoxically the area on the right is the one that is going towards closure at a faster rate. Quite a bit of drainage on the left. The patient went to see Dr. Amalia Hailey who said he was going to [facility] for skin grafts. I had actually considered sending him for skin grafts however he would be mandatorily off his feet for a period of weeks to months. I am thinking that the area on the right is going to close on its own the area on the left has been more stubborn even though we have him in a total contact cast 6/30; took him out of a total contact cast last week is the right heel seem to be making better progress than the left where I was placing the cast. We are using silver alginate. Both wounds are smaller right greater than  left 7/12; both wounds look as though they are making some progress. We are using silver alginate. Heel offloading boots 7/26; very gradual progress especially on the right. Using silver alginate. He is wearing heel offloading boots 8/18; he continues to close these wounds down very gradually. Using silver alginate. The problem polymen being definitive about this is areas of what appears to be callus around the margins. This is not a 100% of the area but certainly sizable especially on the right 9/1; bilateral plantar feet wounds secondary to prolonged pressure while being ventilated for COVID-19 in an upright position. Essentially pressure ulcers on the bottom of his feet. He is made substantial progress using silver alginate. 9/14; bilateral plantar feet wounds secondary to prolonged pressure. Making progress using silver alginate. 9/29 bilateral plantar feet wounds secondary to prolonged pressure. I changed him to Iodoflex last week. MolecuLight showing reddened blush fluorescence 10/11; patient presents for follow-up. He has no issues or complaints today. He denies signs of infection. He continues to use Iodoflex and antibiotic ointment to the wound beds. 10/27; 2-week  follow-up. No evidence of infection. He has callus and thick dry skin around the wound margins we have been using Iodoflex and Bactroban which was in response to a moderate left MolecuLight reddish blush fluorescence. 11/10; 2-week follow-up. Wound margins again have thick callus however the measurements of the actual wound sites are a lot smaller. Everything looks reasonably healthy here. We have been using Iodoflex He was approved for prime matrix but I have elected to delay this given the improvement in the surface area. Hopefully I will not regret that decision as were getting close to the end of the year in terms of insurance payment 12/8; 2-week follow-up. Wounds are generally smaller in size. These were initially substantial wounds extending into the forefoot all the way into the heel on the bilateral plantar feet. They are now both located on the plantar heel distal aspect both of these have a lot of callus around the wounds I used a #5 curette to remove this on the right and the left also some subcutaneous debris to try and get the wound edges were using Iodoflex. He has heel offloading shoe 12/22; 2-week follow-up. Not really much improvement. He has thick callus around the outer edges of both wounds. I remove this there is some nonviable subcutaneous tissue as well. We have been using Iodoflex. Her intake nurse and myself spontaneously thought of a total contact cast I went back in May. At that time we really were not seeing much of an improvement with a cast although the wound was in a much different situation I would like to retry this in 2 weeks and I discussed this with the patient 08/12/2021; the patient has had some improvement with the Iodoflex. The the area on the left heel plantar more improved than the right. I had to put him in a total contact cast on the left although I decided to put that off for 2 weeks. I am going to change his primary dressing to silver collagen. I think in  both areas he has had some improvement most of the healing seems to be more proximal in the heel. The wounds are in the mid aspect. A lot of thick callus on the right heel however. 1/19; we are using silver collagen on both plantar heel areas. He has had some improvement today. The left did not require any debridement. He still had some eschar on the right that was debrided  but both seem to have contracted. I did not put it total contact cast on him today 2/2 we have been using silver collagen. The area on the right plantar heel has areas that appear to be epithelialized interspersed with dry flaking callus and dry skin. I removed this. This really looks better than on the other side. On the left still a large area with raised edges and debris on the surface. 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 He has no issues or complaints today. 2/9; not much change in the left foot wound with 1 week of a cast we are using silver collagen. Silver collagen on the right side. The right side has been the better wound surface. We will reapply the total contact cast on the left 2/16; not much improvement on either side I been using silver collagen with a total contact cast on the left. I'm changing the Hydrofera Blue still with a total contact cast on the left 2/23; some improvement on both sides. Disappointing that he has thick callus around the area that we are putting in a total contact cast on the left. We've been using Hydrofera Blue on both wound areas. This is a man who at essentially pressure ulcers in addition to ischemia caused by medications to support his blood pressure (pressors) in the ICU. He was being ventilated in the standing position for severe Covid. A Shiley the wounds extended across his entire foot but are now localized to his plantar heels bilaterally. We  have made progress however neither areas healed. I continue to think the total contact cast is helped albeit painstakingly slowly. He has never wanted a plastic surgery consult although I don't know that they would be interested in grafting in area in this location. 10/07/2021: Continued improvement bilaterally. There is still some callus around the left wound, despite the total contact cast. He has some increased pain in his right midfoot around 1 particular area. This has been painful in the past but seems to be a little bit worse. When his cast was removed today, there was an area on the heel of the left foot that looks a bit macerated. He is also complaining of pain in his left thigh and hip which he thinks is secondary to the limb length discrepancy caused by the cast. 10/14/2021: He continues to improve. A little bit less callus around the left wound. He continues to endorse pain in his right midfoot, but this is not as significant as it was last week. The maceration on his left heel is improved. 10/21/2021: Continued improvement to both wounds. The maceration on his left heel is no longer evident. Less callus bilaterally. Epithelialization progressing. 10/28/2021: Significant improvement this week. The right sided wound is nearly closed with just a small open area at the middle. No maceration seen on the left heel. Continued epithelialization on both sides. No concern for infection. 11/04/2021: T oday, the wounds were measured a little bit differently and come out as larger, but I actually think they are about the same to potentially even smaller, particularly on the left. He continues to accumulate some callus on the right. 11/11/2021: T oday, the patient is expressing some concern that the left wound, despite being in the total contact cast, is not progressing at the same rate as the 1 on the right. He is interested in trying  a week without the cast to see how the wound does. The wounds are roughly the  same size as last week, with the right perhaps being a little bit smaller. He continues to build up callus on both sites. 11/18/2021: Last week, I permitted the patient to go without his total contact cast, just to see if the cast was really making any difference. Today, both wounds have deteriorated to some extent, suggesting that the cast is providing benefit, at least on the left. Both are larger and have accumulated callus, slough, and other debris. 11/26/2021: I debrided both wounds quite aggressively last week in an effort to stimulate the healing cascade. This appears to have been effective as the left sided wound is a full centimeter shorter in length. Although the right was measured slightly larger, on inspection, it looks as though an area of epithelialized tissue was included in the measurements. We have been using PolyMem Ag on the wound surfaces with a total contact cast to the left. 12/02/2021: It appears that the intake personnel are including epithelialized tissue in his wound measurements; the right wound is almost completely epithelialized; there is just a crater at the proximal midfoot with some open areas. On the left, he has built up some callus, but the overall wound surface looks good. There is some senescent skin around the wound margin. He has been in PolyMem Ag bilaterally with a total contact cast on the left. 12/09/2021: The right wound is nearly closed; there is just a small open area at the mid calcaneus. On the left, the wound is smaller with minimal callus buildup. No significant drainage. 12/16/2021: The right calcaneal wound remains minimally open at the mid calcaneus; the rest has epithelialized. On the left, the wound is also a little bit smaller. There is some senescent tissue on the periphery. He is getting his first application of a trial skin substitute called Vendaje today. 12/23/2021: The wound on his right calcaneus is nearly closed; there is just a small area at  the most distal aspect of the calcaneus that is open. On the left, the area where we applied to the skin substitute has a healthier-looking bed of granulation tissue. The wound dimensions are not significantly different on this side but the wound surface is improved. 12/30/2021: The wound on the right calcaneus has not changed significantly aside from some accumulation of callus. On the left, the open area is smaller and continues to have an improved surface. He continues to accumulate callus around the wound. He is here for his third application of Vendaje. 01/06/2022: The right calcaneal wound is down to just a couple of millimeters. He continues to accumulate periwound callus. He unfortunately got his cast wet earlier in the week and his left foot is macerated, resulting in some superficial skin loss just distal to the open wound. The open wound itself, however, is much smaller and has a healthier appearing surface. He is here for his fourth application of Vendaje. 01/13/2022: The right calcaneal wound is about the same. Unfortunately, once again, his cast got wet and his foot is again macerated. This is caused the left calcaneal wound to enlarge. He is here for his fifth application of Vendaje. 01/20/2022: The right calcaneal wound is very small. There is some periwound callus accumulation. He purchased a new cast protector last week and this has been effective in avoiding the maceration that has been occurring on the left. The left calcaneal wound is narrower and has a healthy and viable-appearing  surface. He is here for his 6 application of Vendaje. 01/27/2022: The right calcaneal wound is down to just a pinhole. There is some periwound slough and callus. On the left, the wound is narrower and shorter by about a centimeter. The surface is robust and viable-appearing. Unfortunately, the rep for the trial skin substitute product did not provide any for [place] to use today. 02/04/2022: The right calcaneal  wound remains unchanged. There is more accumulated callus. On the left, although the intake nurse measured it a little bit longer, it looks about the same to me. The surface has a layer of slough, but underneath this, there is good granulation tissue. Patient History Information obtained from Patient. Family History Cancer - Maternal Grandparents, Diabetes - Father,Paternal Grandparents, Heart Disease - Maternal Grandparents, Hypertension - Father,Paternal Grandparents, Lung Disease - Siblings, No family history of Hereditary Spherocytosis, Kidney Disease, Seizures, Stroke, Thyroid Problems, Tuberculosis. Social History Never smoker, Marital Status - Married, Alcohol Use - Never, Drug Use - No History, Caffeine Use - Daily - tea, soda. Medical History Eyes Denies history of Cataracts, Glaucoma, Optic Neuritis Ear/Nose/Mouth/Throat Denies history of Chronic sinus problems/congestion, Middle ear problems Hematologic/Lymphatic Denies history of Anemia, Hemophilia, Human Immunodeficiency Virus, Lymphedema, Sickle Cell Disease Respiratory Patient has history of Asthma Denies history of Aspiration, Chronic Obstructive Pulmonary Disease (COPD), Pneumothorax, Sleep Apnea, Tuberculosis Cardiovascular Patient has history of Angina - with COVID, Hypertension Denies history of Arrhythmia, Congestive Heart Failure, Coronary Artery Disease, Deep Vein Thrombosis, Hypotension, Myocardial Infarction, Peripheral Arterial Disease, Peripheral Venous Disease, Phlebitis, Vasculitis Gastrointestinal Denies history of Cirrhosis , Colitis, Crohnoos, Hepatitis A, Hepatitis B, Hepatitis C Endocrine Denies history of Type I Diabetes, Type II Diabetes Genitourinary Denies history of End Stage Renal Disease Immunological Denies history of Lupus Erythematosus, Raynaudoos, Scleroderma Integumentary (Skin) Denies history of History of Burn Musculoskeletal Denies history of Gout, Rheumatoid Arthritis,  Osteoarthritis, Osteomyelitis Neurologic Denies history of Dementia, Neuropathy, Quadriplegia, Paraplegia, Seizure Disorder Oncologic Denies history of Received Chemotherapy, Received Radiation Psychiatric Denies history of Anorexia/bulimia, Confinement Anxiety Hospitalization/Surgery History - COVID PNA 07/22/2019- 11/14/2019. - 03/27/2020 wound debridement/ skin graft. Medical A Surgical History Notes nd Constitutional Symptoms (General Health) COVID PNA 07/22/2019-11/14/2019 [facility] ECMO, foot drop left foot , Genitourinary kidney stone Psychiatric anxiety Objective Constitutional Slightly hypertensive. No acute distress.. Vitals Time Taken: 9:50 AM, Height: 69 in, Weight: 280 lbs, BMI: 41.3, Temperature: 98.1 F, Pulse: 83 bpm, Respiratory Rate: 18 breaths/min, Blood Pressure: 145/84 mmHg. Respiratory Normal work of breathing on room air.. General Notes: 02/04/2022: The right calcaneal wound remains unchanged. There is more accumulated callus. On the left, although the intake nurse measured it a little bit longer, it looks about the same to me. The surface has a layer of slough, but underneath this, there is good granulation tissue. Integumentary (Hair, Skin) Wound #1 status is Open. Original cause of wound was Pressure Injury. The date acquired was: 10/07/2019. The wound has been in treatment 81 weeks. The wound is located on the Right Calcaneus. The wound measures 0.3cm length x 0.2cm width x 0.2cm depth; 0.047cm^2 area and 0.009cm^3 volume. There is Fat Layer (Subcutaneous Tissue) exposed. There is no tunneling or undermining noted. There is a medium amount of serosanguineous drainage noted. The wound margin is thickened. There is small (1-33%) pink granulation within the wound bed. There is a large (67-100%) amount of necrotic tissue within the wound bed including Eschar and Adherent Slough. Wound #2 status is Open. Original cause of wound was Pressure Injury. The date acquired  was:  10/07/2019. The wound has been in treatment 81 weeks. The wound is located on the Left Calcaneus. The wound measures 2cm length x 0.6cm width x 0.2cm depth; 0.942cm^2 area and 0.188cm^3 volume. There is Fat Layer (Subcutaneous Tissue) exposed. There is no tunneling or undermining noted. There is a medium amount of serosanguineous drainage noted. The wound margin is distinct with the outline attached to the wound base. There is medium (34-66%) pink, pale granulation within the wound bed. There is a medium (34- 66%) amount of necrotic tissue within the wound bed including Adherent Slough. Assessment Active Problems ICD-10 Non-pressure chronic ulcer of other part of right foot with fat layer exposed Non-pressure chronic ulcer of other part of left foot with fat layer exposed Procedures Wound #1 Pre-procedure diagnosis of Wound #1 is a Pressure Ulcer located on the Right Calcaneus . There was a Selective/Open Wound Non-Viable Tissue Debridement with a total area of 0.06 sq cm performed by Fredirick Maudlin, MD. With the following instrument(s): Curette to remove Non-Viable tissue/material. Material removed includes Eschar and Callus and after achieving pain control using Lidocaine 4% T opical Solution. No specimens were taken. A time out was conducted at 10:25, prior to the start of the procedure. A Minimum amount of bleeding was controlled with Pressure. The procedure was tolerated well with a pain level of 0 throughout and a pain level of 0 following the procedure. Post Debridement Measurements: 0.3cm length x 0.2cm width x 0.2cm depth; 0.009cm^3 volume. Post debridement Stage noted as Category/Stage III. Character of Wound/Ulcer Post Debridement is improved. Post procedure Diagnosis Wound #1: Same as Pre-Procedure Pre-procedure diagnosis of Wound #1 is a Pressure Ulcer located on the Right Calcaneus. A skin graft procedure using a bioengineered skin substitute/cellular or tissue based product was  performed by Fredirick Maudlin, MD with the following instrument(s): Forceps and Scissors. Other was applied and secured with Steri-Strips. 0.8 sq cm of product was utilized and 15.2 sq cm was wasted due to not needed,wound size. Post Application, adaptic;gauze was applied. A Time Out was conducted at 10:25, prior to the start of the procedure. The procedure was tolerated well with a pain level of 0 throughout and a pain level of 0 following the procedure. Post procedure Diagnosis Wound #1: Same as Pre-Procedure General Notes: ** vendaje**. Wound #2 Pre-procedure diagnosis of Wound #2 is a Pressure Ulcer located on the Left Calcaneus. A skin graft procedure using a bioengineered skin substitute/cellular or tissue based product was performed by Fredirick Maudlin, MD with the following instrument(s): Forceps and Scissors. Other was applied and secured with Steri-Strips. 15.2 sq cm of product was utilized and 0.8 sq cm was wasted due to not needed,wound size. Post Application, adaptic;gauze was applied. A Time Out was conducted at 10:25, prior to the start of the procedure. The procedure was tolerated well with a pain level of 0 throughout and a pain level of 0 following the procedure. Post procedure Diagnosis Wound #2: Same as Pre-Procedure General Notes: *** Vendaje***. Pre-procedure diagnosis of Wound #2 is a Pressure Ulcer located on the Left Calcaneus . There was a T Contact Cast Procedure by Fredirick Maudlin, MD. otal Post procedure Diagnosis Wound #2: Same as Pre-Procedure Plan 02/04/2022: The right calcaneal wound remains unchanged. There is more accumulated callus. On the left, although the intake nurse measured it a little bit longer, it looks about the same to me. The surface has a layer of slough, but underneath this, there is good granulation tissue. I debrided periwound  callus from the right calcaneal wound with a curette. The slough on the left was also removed. I decided to use a small  piece of the trial skin substitute (Vendaje, no charge) in the right sided wound and placed remainder in the left sided wound. The product was secured with Adaptic and Steri- Strips. We will apply a total contact cast to the left. I told the patient's wife to wait to change his right-sided dressing for 5 days and then when she does change it, to leave the Adaptic in place. Follow-up in 1 week. Electronic Signature(s) Signed: 02/04/2022 10:52:49 AM By: Fredirick Maudlin MD FACS Entered By: Fredirick Maudlin on 02/04/2022 10:52:49 -------------------------------------------------------------------------------- HxROS Details Patient Name: Date of Service: [name], [place] 02/04/2022 9:30 A M Medical Record Number: [id number] Patient Account Number: [id number] Date of Birth/Sex: Treating RN: 03/16/1976 (46 y.o. [name] Primary Care Provider: Cristie [name] Other Clinician: Referring Provider: Treating Provider/Extender: [name] in Treatment: 72 Information Obtained From Patient Constitutional Symptoms (General Health) Medical History: Past Medical History Notes: COVID PNA 07/22/2019-11/14/2019 [facility] ECMO, foot drop left foot , Eyes Medical History: Negative for: Cataracts; Glaucoma; Optic Neuritis Ear/Nose/Mouth/Throat Medical History: Negative for: Chronic sinus problems/congestion; Middle ear problems Hematologic/Lymphatic Medical History: Negative for: Anemia; Hemophilia; Human Immunodeficiency Virus; Lymphedema; Sickle Cell Disease Respiratory Medical History: Positive for: Asthma Negative for: Aspiration; Chronic Obstructive Pulmonary Disease (COPD); Pneumothorax; Sleep Apnea; Tuberculosis Cardiovascular Medical History: Positive for: Angina - with COVID; Hypertension Negative for: Arrhythmia; Congestive Heart Failure; Coronary Artery Disease; Deep Vein Thrombosis; Hypotension; Myocardial Infarction; Peripheral Arterial Disease; Peripheral  Venous Disease; Phlebitis; Vasculitis Gastrointestinal Medical History: Negative for: Cirrhosis ; Colitis; Crohns; Hepatitis A; Hepatitis B; Hepatitis C Endocrine Medical History: Negative for: Type I Diabetes; Type II Diabetes Genitourinary Medical History: Negative for: End Stage Renal Disease Past Medical History Notes: kidney stone Immunological Medical History: Negative for: Lupus Erythematosus; Raynauds; Scleroderma Integumentary (Skin) Medical History: Negative for: History of Burn Musculoskeletal Medical History: Negative for: Gout; Rheumatoid Arthritis; Osteoarthritis; Osteomyelitis Neurologic Medical History: Negative for: Dementia; Neuropathy; Quadriplegia; Paraplegia; Seizure Disorder Oncologic Medical History: Negative for: Received Chemotherapy; Received Radiation Psychiatric Medical History: Negative for: Anorexia/bulimia; Confinement Anxiety Past Medical History Notes: anxiety Immunizations Pneumococcal Vaccine: Received Pneumococcal Vaccination: No Implantable Devices None Hospitalization / Surgery History Type of Hospitalization/Surgery COVID PNA 07/22/2019- 11/14/2019 03/27/2020 wound debridement/ skin graft Family and Social History Cancer: Yes - Maternal Grandparents; Diabetes: Yes - Father,Paternal Grandparents; Heart Disease: Yes - Maternal Grandparents; Hereditary Spherocytosis: No; Hypertension: Yes - Father,Paternal Grandparents; Kidney Disease: No; Lung Disease: Yes - Siblings; Seizures: No; Stroke: No; Thyroid Problems: No; Tuberculosis: No; Never smoker; Marital Status - Married; Alcohol Use: Never; Drug Use: No History; Caffeine Use: Daily - tea, soda; Financial Concerns: No; Food, Clothing or Shelter Needs: No; Support System Lacking: No; Transportation Concerns: No Electronic Signature(s) Signed: 02/04/2022 11:01:22 AM By: Fredirick Maudlin MD FACS Signed: 02/04/2022 5:36:57 PM By: [name] Entered By: Fredirick Maudlin on  02/04/2022 10:50:07 -------------------------------------------------------------------------------- Total Contact Cast Details Patient Name: Date of Service: [name], [place] 02/04/2022 9:30 A M Medical Record Number: [id number] Patient Account Number: [id number] Date of Birth/Sex: Treating RN: 1976/06/10 (46 y.o. [name] Primary Care Provider: Cristie [name] Other Clinician: Referring Provider: Treating Provider/Extender: [name] in Treatment: 90 T Contact Cast Applied for Wound Assessment: otal Wound #2 Left Calcaneus Performed By: Physician Fredirick Maudlin, MD Post Procedure Diagnosis Same as Pre-procedure Electronic Signature(s) Signed: 02/04/2022  11:01:22 AM By: Fredirick Maudlin MD FACS Signed: 02/04/2022 5:36:57 PM By: [name] Entered By: [name] on 02/04/2022 10:43:33 -------------------------------------------------------------------------------- SuperBill Details Patient Name: Date of Service: [name], [place]. 02/04/2022 Medical Record Number: [id number] Patient Account Number: [id number] Date of Birth/Sex: Treating RN: 02-24-1976 (46 y.o. [name] Primary Care Provider: Cristie [name] Other Clinician: Referring Provider: Treating Provider/Extender: [name] in Treatment: 41 Diagnosis Coding ICD-10 Codes Code Description [phone] Non-pressure chronic ulcer of other part of right foot with fat layer exposed L97.522 Non-pressure chronic ulcer of other part of left foot with fat layer exposed Facility Procedures CPT4 Code: [id number] Description: [phone] - DEBRIDE WOUND 1ST 20 SQ CM OR < ICD-10 Diagnosis Description L97.512 Non-pressure chronic ulcer of other part of right foot with fat layer exposed Modifier: Quantity: 1 CPT4 Code: [id number] Description: 16579 - APPLY TOTAL CONTACT LEG CAST ICD-10 Diagnosis Description L97.522 Non-pressure chronic ulcer of other part of left foot  with fat layer exposed Modifier: Quantity: 1 Physician Procedures : CPT4 Code Description Modifier [id number] - WC PHYS LEVEL 3 - EST PT 25 ICD-10 Diagnosis Description L97.512 Non-pressure chronic ulcer of other part of right foot with fat layer exposed L97.522 Non-pressure chronic ulcer of other part of left foot  with fat layer exposed Quantity: 1 : [id number] - WC PHYS DEBR WO ANESTH 20 SQ CM 1 ICD-10 Diagnosis Description L97.512 Non-pressure chronic ulcer of other part of right foot with fat layer exposed Quantity: : [id number] - WC PHYS APPLY TOTAL CONTACT CAST 1 ICD-10 Diagnosis Description L97.522 Non-pressure chronic ulcer of other part of left foot with fat layer exposed Quantity: Electronic Signature(s) Signed: 02/04/2022 10:53:30 AM By: Fredirick Maudlin MD FACS Entered By: Fredirick Maudlin on 02/04/2022 10:53:29

## 2022-02-04 NOTE — Progress Notes (Signed)
[name], [name] ([id number]) Visit Report for 02/04/2022 Arrival Information Details Patient Name: Date of Service: [name], [place] 02/04/2022 9:30 A M Medical Record Number: [id number] Patient Account Number: [id number] Date of Birth/Sex: Treating RN: 05/11/1976 (46 y.o. [name] Primary Care Sabrinna Yearwood: [name] Other Clinician: Referring Kerrick Miler: Treating Sebastin Perlmutter/Extender: [name] in Treatment: 13 Visit Information History Since Last Visit Added or deleted any medications: No Patient Arrived: Wheel Chair Any new allergies or adverse reactions: No Arrival Time: 09:50 Had a fall or experienced change in No Accompanied By: spouse activities of daily living that may affect Transfer Assistance: None risk of falls: Patient Identification Verified: Yes Signs or symptoms of abuse/neglect since last visito No Patient Requires Transmission-Based Precautions: No Hospitalized since last visit: No Patient Has Alerts: No Implantable device outside of the clinic excluding No cellular tissue based products placed in the center since last visit: Has Dressing in Place as Prescribed: Yes Pain Present Now: No Electronic Signature(s) Signed: 02/04/2022 5:36:57 PM By: [name] Entered By: [name] on 02/04/2022 09:51:15 -------------------------------------------------------------------------------- Encounter Discharge Information Details Patient Name: Date of Service: [name], [place]. 02/04/2022 9:30 A M Medical Record Number: [id number] Patient Account Number: [id number] Date of Birth/Sex: Treating RN: 03-14-1976 (46 y.o. [name] Primary Care Annika Selke: [name] Other Clinician: Referring Kayren Holck: Treating Tali Cleaves/Extender: [name] in Treatment: 52 Encounter Discharge Information Items Post Procedure Vitals Discharge Condition: Stable Temperature (F): 98.1 Ambulatory Status:  Wheelchair Pulse (bpm): 83 Discharge Destination: Home Respiratory Rate (breaths/min): 18 Transportation: Private Auto Blood Pressure (mmHg): 145/84 Accompanied By: spouseand son Schedule Follow-up Appointment: Yes Clinical Summary of Care: Patient Declined Electronic Signature(s) Signed: 02/04/2022 5:36:57 PM By: [name] Entered By: [name] on 02/04/2022 17:31:33 -------------------------------------------------------------------------------- Lower Extremity Assessment Details Patient Name: Date of Service: [name], [place] 02/04/2022 9:30 A M Medical Record Number: [id number] Patient Account Number: [id number] Date of Birth/Sex: Treating RN: 1975/08/23 (46 y.o. [name] Primary Care Chrystle Murillo: [name] Other Clinician: Referring Eyoel Throgmorton: Treating Ashwini Jago/Extender: [name] in Treatment: 36 Edema Assessment Assessed: [name]: No] [name]: No] Edema: [Left: No] [Right: No] Calf Left: Right: Point of Measurement: 29 cm From Medial Instep 41 cm 43 cm Ankle Left: Right: Point of Measurement: 9 cm From Medial Instep 24.5 cm 24.5 cm Electronic Signature(s) Signed: 02/04/2022 5:36:57 PM By: [name] Entered By: [name] on 02/04/2022 10:09:59 -------------------------------------------------------------------------------- Multi Wound Chart Details Patient Name: Date of Service: [name], [place] 02/04/2022 9:30 A M Medical Record Number: [id number] Patient Account Number: [id number] Date of Birth/Sex: Treating RN: 12-Oct-1975 (46 y.o. [name] Primary Care Skyelar Swigart: [name] Other Clinician: Referring Gracieann Stannard: Treating Mehki Klumpp/Extender: [name] in Treatment: 24 Vital Signs Height(in): 69 Pulse(bpm): 44 Weight(lbs): 280 Blood Pressure(mmHg): 145/84 Body Mass Index(BMI): 41.3 Temperature(F): 98.1 Respiratory Rate(breaths/min): 18 Photos: [N/A:N/A] Right  Calcaneus Left Calcaneus N/A Wound Location: Pressure Injury Pressure Injury N/A Wounding Event: Pressure Ulcer Pressure Ulcer N/A Primary Etiology: Asthma, Angina, Hypertension Asthma, Angina, Hypertension N/A Comorbid History: 10/07/2019 10/07/2019 N/A Date Acquired: 81 81 N/A Weeks of Treatment: Open Open N/A Wound Status: No No N/A Wound Recurrence: 0.3x0.2x0.2 2x0.6x0.2 N/A Measurements L x W x D (cm) 0.047 0.942 N/A A (cm) : rea 0.009 0.188 N/A Volume (cm) : 99.90% 96.50% N/A % Reduction in A rea: 100.00% 99.30% N/A % Reduction in Volume: Category/Stage III Category/Stage III N/A Classification:  Medium Medium N/A Exudate A mount: Serosanguineous Serosanguineous N/A Exudate Type: red, brown red, brown N/A Exudate Color: Thickened Distinct, outline attached N/A Wound Margin: Small (1-33%) Medium (34-66%) N/A Granulation A mount: Pink Pink, Pale N/A Granulation Quality: Large (67-100%) Medium (34-66%) N/A Necrotic A mount: Eschar, Adherent Slough Adherent Slough N/A Necrotic Tissue: Fat Layer (Subcutaneous Tissue): Yes Fat Layer (Subcutaneous Tissue): Yes N/A Exposed Structures: Fascia: No Fascia: No Tendon: No Tendon: No Muscle: No Muscle: No Joint: No Joint: No Bone: No Bone: No Medium (34-66%) Small (1-33%) N/A Epithelialization: Debridement - Selective/Open Wound N/A N/A Debridement: Pre-procedure Verification/Time Out 10:25 N/A N/A Taken: Lidocaine 4% Topical Solution N/A N/A Pain Control: Necrotic/Eschar, Callus N/A N/A Tissue Debrided: Non-Viable Tissue N/A N/A Level: 0.06 N/A N/A Debridement A (sq cm): rea Curette N/A N/A Instrument: Minimum N/A N/A Bleeding: Pressure N/A N/A Hemostasis A chieved: 0 N/A N/A Procedural Pain: 0 N/A N/A Post Procedural Pain: Procedure was tolerated well N/A N/A Debridement Treatment Response: 0.3x0.2x0.2 N/A N/A Post Debridement Measurements L x W x D (cm) 0.009 N/A N/A Post Debridement  Volume: (cm) Category/Stage III N/A N/A Post Debridement Stage: Cellular or Tissue Based Product Cellular or Tissue Based Product N/A Procedures Performed: Debridement T Contact Cast otal Treatment Notes Electronic Signature(s) Signed: 02/04/2022 10:48:40 AM By: Fredirick Maudlin MD FACS Signed: 02/04/2022 5:36:57 PM By: [name] Entered By: Fredirick Maudlin on 02/04/2022 10:48:40 -------------------------------------------------------------------------------- Multi-Disciplinary Care Plan Details Patient Name: Date of Service: [name], [name] 02/04/2022 9:30 A M Medical Record Number: [id number] Patient Account Number: [id number] Date of Birth/Sex: Treating RN: [birth year] (46 y.o. [name] Primary Care Adine Heimann: [name] Other Clinician: Referring Kaetlin Bullen: Treating Finola Rosal/Extender: [name] in Treatment: 10 [place] reviewed with physician Active Inactive Wound/Skin Impairment Nursing Diagnoses: Impaired tissue integrity Knowledge deficit related to ulceration/compromised skin integrity Goals: Patient/caregiver will verbalize understanding of skin care regimen Date Initiated: 07/15/2020 Target Resolution Date: 02/04/2022 Goal Status: Active Ulcer/skin breakdown will have a volume reduction of 30% by week 4 Date Initiated: 07/15/2020 Date Inactivated: 08/20/2020 Target Resolution Date: 09/03/2020 Goal Status: Unmet Unmet Reason: no major changes. Ulcer/skin breakdown will heal within 14 weeks Date Initiated: 12/04/2020 Date Inactivated: 12/10/2020 Target Resolution Date: 12/10/2020 Unmet Reason: wounds still open at 14 Goal Status: Unmet weeks and today 21 weeks. Interventions: Assess patient/caregiver ability to obtain necessary supplies Assess patient/caregiver ability to perform ulcer/skin care regimen upon admission and as needed Assess ulceration(s) every visit Provide education on ulcer and skin  care Treatment Activities: Skin care regimen initiated : 07/15/2020 Topical wound management initiated : 07/15/2020 Notes: Electronic Signature(s) Signed: 02/04/2022 5:36:57 PM By: [name] Entered By: [name] on 02/04/2022 17:29:12 -------------------------------------------------------------------------------- Pain Assessment Details Patient Name: Date of Service: [name], [place]. 02/04/2022 9:30 A M Medical Record Number: [id number] Patient Account Number: [id number] Date of Birth/Sex: Treating RN: 04-19-1976 (46 y.o. [name] Primary Care Jowana Thumma: [name] Other Clinician: Referring Neftaly Inzunza: Treating Loran Auguste/Extender: [name] in Treatment: 40 Active Problems Location of Pain Severity and Description of Pain Patient Has Paino No Site Locations Pain Management and Medication Current Pain Management: Electronic Signature(s) Signed: 02/04/2022 5:36:57 PM By: [name] Entered By: [name] on 02/04/2022 09:51:46 -------------------------------------------------------------------------------- Patient/Caregiver Education Details Patient Name: Date of Service: Ciro Backer 6/30/2023andnbsp9:30 [place] Record Number: [id number] Patient Account Number: [id number] Date of Birth/Gender: Treating RN: [birth year] (46 y.o. [name] Primary  Care Physician: [name] Other Clinician: Referring Physician: Treating Physician/Extender: [name] in Treatment: 90 Education Assessment Education Provided To: Patient Education Topics Provided Wound/Skin Impairment: Methods: Explain/Verbal Responses: Return demonstration correctly Electronic Signature(s) Signed: 02/04/2022 5:36:57 PM By: [name] Entered By: [name] on 02/04/2022 46:65:99 -------------------------------------------------------------------------------- Wound Assessment  Details Patient Name: Date of Service: [name], [place] 02/04/2022 9:30 A M Medical Record Number: [id number] Patient Account Number: [id number] Date of Birth/Sex: Treating RN: 1975-10-05 (46 y.o. [name] Primary Care Ricka Westra: [name] Other Clinician: Referring Laressa Bolinger: Treating Kaeya Schiffer/Extender: [name] in Treatment: 56 Wound Status Wound Number: 1 Primary Etiology: Pressure Ulcer Wound Location: Right Calcaneus Wound Status: Open Wounding Event: Pressure Injury Comorbid History: Asthma, Angina, Hypertension Date Acquired: 10/07/2019 Weeks Of Treatment: 81 Clustered Wound: No Photos Wound Measurements Length: (cm) 0.3 Width: (cm) 0.2 Depth: (cm) 0.2 Area: (cm) 0.047 Volume: (cm) 0.009 % Reduction in Area: 99.9% % Reduction in Volume: 100% Epithelialization: Medium (34-66%) Tunneling: No Undermining: No Wound Description Classification: Category/Stage III Wound Margin: Thickened Exudate Amount: Medium Exudate Type: Serosanguineous Exudate Color: red, brown Foul Odor After Cleansing: No Slough/Fibrino Yes Wound Bed Granulation Amount: Small (1-33%) Exposed Structure Granulation Quality: Pink Fascia Exposed: No Necrotic Amount: Large (67-100%) Fat Layer (Subcutaneous Tissue) Exposed: Yes Necrotic Quality: Eschar, Adherent Slough Tendon Exposed: No Muscle Exposed: No Joint Exposed: No Bone Exposed: No Treatment Notes Wound #1 (Calcaneus) Wound Laterality: Right Cleanser Normal Saline Discharge Instruction: Cleanse the wound with Normal Saline prior to applying a clean dressing using gauze sponges, not tissue or cotton balls. Wound Cleanser Discharge Instruction: Cleanse the wound with wound cleanser prior to applying a clean dressing using gauze sponges, not tissue or cotton balls. Peri-Wound Care Topical Primary Dressing VENDAJE Secondary Dressing Zetuvit Plus 4x8 in Discharge Instruction: Apply over primary  dressing as directed. Secured With Elastic Bandage 4 inch (ACE bandage) Discharge Instruction: Secure with ACE bandage as directed. Kerlix Roll Sterile, 4.5x3.1 (in/yd) Discharge Instruction: Secure with Kerlix as directed. Compression Wrap Compression Stockings Add-Ons Electronic Signature(s) Signed: 02/04/2022 5:36:57 PM By: [name] Entered By: [name] on 02/04/2022 10:18:50 -------------------------------------------------------------------------------- Wound Assessment Details Patient Name: Date of Service: [name], [place] 02/04/2022 9:30 A M Medical Record Number: [id number] Patient Account Number: [id number] Date of Birth/Sex: Treating RN: [birth year] (46 y.o. [name] Primary Care Ameya Kutz: [name] Other Clinician: Referring Ennifer Harston: Treating Riane Rung/Extender: [name] in Treatment: 49 Wound Status Wound Number: 2 Primary Etiology: Pressure Ulcer Wound Location: Left Calcaneus Wound Status: Open Wounding Event: Pressure Injury Comorbid History: Asthma, Angina, Hypertension Date Acquired: 10/07/2019 Weeks Of Treatment: 81 Clustered Wound: No Photos Wound Measurements Length: (cm) 2 Width: (cm) 0.6 Depth: (cm) 0.2 Area: (cm) 0.942 Volume: (cm) 0.188 % Reduction in Area: 96.5% % Reduction in Volume: 99.3% Epithelialization: Small (1-33%) Tunneling: No Undermining: No Wound Description Classification: Category/Stage III Wound Margin: Distinct, outline attached Exudate Amount: Medium Exudate Type: Serosanguineous Exudate Color: red, brown Foul Odor After Cleansing: No Slough/Fibrino Yes Wound Bed Granulation Amount: Medium (34-66%) Exposed Structure Granulation Quality: Pink, Pale Fascia Exposed: No Necrotic Amount: Medium (34-66%) Fat Layer (Subcutaneous Tissue) Exposed: Yes Necrotic Quality: Adherent Slough Tendon Exposed: No Muscle Exposed: No Joint Exposed: No Bone Exposed:  No Treatment Notes Wound #2 (Calcaneus) Wound Laterality: Left Cleanser Normal Saline Discharge Instruction: Cleanse the wound with Normal Saline prior to applying a clean dressing using gauze sponges, not tissue or cotton  balls. Wound Cleanser Discharge Instruction: Cleanse the wound with wound cleanser prior to applying a clean dressing using gauze sponges, not tissue or cotton balls. Peri-Wound Care Zinc Oxide Ointment 30g tube Discharge Instruction: Apply Zinc Oxide to periwound with each dressing change Topical Primary Dressing VENDAJE Secondary Dressing Optifoam Non-Adhesive Dressing, 4x4 in Discharge Instruction: Apply over primary dressing as directed. Zetuvit Plus 4x8 in Discharge Instruction: Apply over primary dressing as directed. Secured With [employer], 4.5x3.1 (in/yd) Discharge Instruction: Secure with Kerlix as directed. 68M Medipore H Soft Cloth Surgical T ape, 4 x 10 (in/yd) Discharge Instruction: Secure with tape as directed. Compression Wrap Compression Stockings Add-Ons Electronic Signature(s) Signed: 02/04/2022 5:36:57 PM By: [name] Entered By: [name] on 02/04/2022 10:19:37 -------------------------------------------------------------------------------- Vitals Details Patient Name: Date of Service: [name], [place] 02/04/2022 9:30 A M Medical Record Number: [id number] Patient Account Number: [id number] Date of Birth/Sex: Treating RN: 1975/10/05 (46 y.o. [name] Primary Care Vertie Dibbern: [name] Other Clinician: Referring Nethan Caudillo: Treating Lander Eslick/Extender: [name] in Treatment: 77 Vital Signs Time Taken: 09:50 Temperature (F): 98.1 Height (in): 69 Pulse (bpm): 83 Weight (lbs): 280 Respiratory Rate (breaths/min): 18 Body Mass Index (BMI): 41.3 Blood Pressure (mmHg): 145/84 Reference Range: 80 - 120 mg / dl Electronic Signature(s) Signed: 02/04/2022 5:36:57 PM By: [name] Entered By: [name] on 02/04/2022 09:51:37

## 2022-02-10 ENCOUNTER — Encounter (HOSPITAL_BASED_OUTPATIENT_CLINIC_OR_DEPARTMENT_OTHER): Payer: PPO | Attending: General Surgery | Admitting: General Surgery

## 2022-02-10 DIAGNOSIS — L97522 Non-pressure chronic ulcer of other part of left foot with fat layer exposed: Secondary | ICD-10-CM | POA: Insufficient documentation

## 2022-02-10 DIAGNOSIS — L97412 Non-pressure chronic ulcer of right heel and midfoot with fat layer exposed: Secondary | ICD-10-CM | POA: Diagnosis not present

## 2022-02-10 DIAGNOSIS — L97512 Non-pressure chronic ulcer of other part of right foot with fat layer exposed: Secondary | ICD-10-CM | POA: Diagnosis not present

## 2022-02-10 DIAGNOSIS — L89620 Pressure ulcer of left heel, unstageable: Secondary | ICD-10-CM | POA: Diagnosis not present

## 2022-02-10 DIAGNOSIS — L89613 Pressure ulcer of right heel, stage 3: Secondary | ICD-10-CM | POA: Diagnosis not present

## 2022-02-10 NOTE — Progress Notes (Signed)
JAKYREN, FLUEGGE ([id number]) Visit Report for 02/10/2022 Arrival Information Details Patient Name: Date of Service: [name], TO [name] 02/10/2022 9:30 A M Medical Record Number: [id number] Patient Account Number: [id number] Date of Birth/Sex: Treating RN: October 28, 1975 (46 y.o. [name] Primary Care Shawnique Mariotti: Dorinda Hill Other Clinician: Referring Cherilynn Schomburg: Treating Mollyann Halbert/Extender: Jana Hakim in Treatment: 91 Visit Information History Since Last Visit Added or deleted any medications: No Patient Arrived: Wheel Chair Any new allergies or adverse reactions: No Arrival Time: 09:37 Had a fall or experienced change in No Accompanied By: spouse and son activities of daily living that may affect Transfer Assistance: None risk of falls: Patient Identification Verified: Yes Signs or symptoms of abuse/neglect since last visito No Patient Requires Transmission-Based Precautions: No Hospitalized since last visit: No Patient Has Alerts: No Implantable device outside of the clinic excluding No cellular tissue based products placed in the center since last visit: Has Dressing in Place as Prescribed: Yes Pain Present Now: Yes Electronic Signature(s) Signed: 02/10/2022 5:29:32 PM By: [name] RN Entered By: [name] on 02/10/2022 09:38:42 -------------------------------------------------------------------------------- Encounter Discharge Information Details Patient Name: Date of Service: [name], [name] 02/10/2022 9:30 A M Medical Record Number: [id number] Patient Account Number: [id number] Date of Birth/Sex: Treating RN: 07-15-1976 (46 y.o. [name] Primary Care Trai Ells: Dorinda Hill Other Clinician: Referring Zyiere Rosemond: Treating Fahmida Jurich/Extender: Jana Hakim in Treatment: 47 Encounter Discharge Information Items Post Procedure Vitals Discharge Condition: Stable Temperature (F): 98.5 Ambulatory Status:  Wheelchair Pulse (bpm): 81 Discharge Destination: Home Respiratory Rate (breaths/min): 16 Transportation: Private Auto Blood Pressure (mmHg): 153/80 Accompanied By: spouseand sOn Schedule Follow-up Appointment: Yes Clinical Summary of Care: Patient Declined Electronic Signature(s) Signed: 02/10/2022 5:29:32 PM By: [name] RN Entered By: [name] on 02/10/2022 17:26:03 -------------------------------------------------------------------------------- Lower Extremity Assessment Details Patient Name: Date of Service: [name], TO [name] 02/10/2022 9:30 A M Medical Record Number: [id number] Patient Account Number: [id number] Date of Birth/Sex: Treating RN: February 16, 1976 (46 y.o. [name] Primary Care Amyrie Illingworth: Dorinda Hill Other Clinician: Referring Hristopher Missildine: Treating Shyia Fillingim/Extender: Jana Hakim in Treatment: 80 Edema Assessment Assessed: [name]: No] Franne Forts: No] Edema: [Left: No] [Right: No] Calf Left: Right: Point of Measurement: 29 cm From Medial Instep 42 cm 46.3 cm Ankle Left: Right: Point of Measurement: 9 cm From Medial Instep 25 cm 25.4 cm Electronic Signature(s) Signed: 02/10/2022 5:29:32 PM By: [name] RN Entered By: [name] on 02/10/2022 09:47:04 -------------------------------------------------------------------------------- Multi Wound Chart Details Patient Name: Date of Service: [name], [name] 02/10/2022 9:30 A M Medical Record Number: [id number] Patient Account Number: [id number] Date of Birth/Sex: Treating RN: 03/11/1976 (46 y.o. [name] Primary Care Porfirio Bollier: Dorinda Hill Other Clinician: Referring Kimoni Pickerill: Treating Jakoby Melendrez/Extender: Jana Hakim in Treatment: 49 Vital Signs Height(in): 69 Pulse(bpm): 81 Weight(lbs): 280 Blood Pressure(mmHg): 153/80 Body Mass Index(BMI): 41.3 Temperature(F): 98.5 Respiratory Rate(breaths/min): 16 Photos: [N/A:N/A] Right  Calcaneus Left Calcaneus N/A Wound Location: Pressure Injury Pressure Injury N/A Wounding Event: Pressure Ulcer Pressure Ulcer N/A Primary Etiology: Asthma, Angina, Hypertension Asthma, Angina, Hypertension N/A Comorbid History: 10/07/2019 10/07/2019 N/A Date Acquired: 64 82 N/A Weeks of Treatment: Open Open N/A Wound Status: No No N/A Wound Recurrence: 0.3x0.3x0.1 1.5x0.5x0.1 N/A Measurements L x W x D (cm) 0.071 0.589 N/A A (cm) : rea 0.007 0.059 N/A Volume (cm) : 99.80% 97.80% N/A % Reduction in A rea: 100.00% 99.80% N/A % Reduction in Volume: Category/Stage III Category/Stage III  N/A Classification: Medium Medium N/A Exudate A mount: Serosanguineous Serosanguineous N/A Exudate Type: red, brown red, brown N/A Exudate Color: Thickened Distinct, outline attached N/A Wound Margin: Small (1-33%) Medium (34-66%) N/A Granulation A mount: Pink Pink, Pale N/A Granulation Quality: Large (67-100%) Medium (34-66%) N/A Necrotic A mount: Eschar, Adherent Slough Adherent Slough N/A Necrotic Tissue: Fat Layer (Subcutaneous Tissue): Yes Fascia: No N/A Exposed Structures: Fascia: No Fat Layer (Subcutaneous Tissue): No Tendon: No Tendon: No Muscle: No Muscle: No Joint: No Joint: No Bone: No Bone: No Medium (34-66%) Small (1-33%) N/A Epithelialization: Debridement - Selective/Open Wound N/A N/A Debridement: Pre-procedure Verification/Time Out 09:57 N/A N/A Taken: Lidocaine 4% Topical Solution N/A N/A Pain Control: Callus N/A N/A Tissue Debrided: Non-Viable Tissue N/A N/A Level: 0.09 N/A N/A Debridement A (sq cm): rea Curette N/A N/A Instrument: Minimum N/A N/A Bleeding: Pressure N/A N/A Hemostasis A chieved: 0 N/A N/A Procedural Pain: 0 N/A N/A Post Procedural Pain: Procedure was tolerated well N/A N/A Debridement Treatment Response: 0.3x0.3x0.1 N/A N/A Post Debridement Measurements L x W x D (cm) 0.007 N/A N/A Post Debridement Volume:  (cm) Category/Stage III N/A N/A Post Debridement Stage: Debridement T Contact Cast otal N/A Procedures Performed: Treatment Notes Electronic Signature(s) Signed: 02/10/2022 10:23:05 AM By: Fredirick Maudlin MD FACS Signed: 02/10/2022 5:29:32 PM By: [name] Entered By: Fredirick Maudlin on 02/10/2022 10:23:05 -------------------------------------------------------------------------------- Multi-Disciplinary Care Plan Details Patient Name: Date of Service: [name], [place] E. 02/10/2022 9:30 A M Medical Record Number: [id number] Patient Account Number: [id number] Date of Birth/Sex: Treating RN: 1975/11/26 (46 y.o. [name] Primary Care [name] Nardozzi: [name] Other Clinician: Referring Jailine Lieder: Treating Amarionna Arca/Extender: [name] in Treatment: 38 [place] reviewed with physician Active Inactive Wound/Skin Impairment Nursing Diagnoses: Impaired tissue integrity Knowledge deficit related to ulceration/compromised skin integrity Goals: Patient/caregiver will verbalize understanding of skin care regimen Date Initiated: 07/15/2020 Target Resolution Date: 03/04/2022 Goal Status: Active Ulcer/skin breakdown will have a volume reduction of 30% by week 4 Date Initiated: 07/15/2020 Date Inactivated: 08/20/2020 Target Resolution Date: 09/03/2020 Goal Status: Unmet Unmet Reason: no major changes. Ulcer/skin breakdown will heal within 14 weeks Date Initiated: 12/04/2020 Date Inactivated: 12/10/2020 Target Resolution Date: 12/10/2020 Unmet Reason: wounds still open at 14 Goal Status: Unmet weeks and today 21 weeks. Interventions: Assess patient/caregiver ability to obtain necessary supplies Assess patient/caregiver ability to perform ulcer/skin care regimen upon admission and as needed Assess ulceration(s) every visit Provide education on ulcer and skin care Treatment Activities: Skin care regimen initiated : 07/15/2020 Topical  wound management initiated : 07/15/2020 Notes: Electronic Signature(s) Signed: 02/10/2022 5:29:32 PM By: [name] Entered By: [name] on 02/10/2022 09:50:27 -------------------------------------------------------------------------------- Pain Assessment Details Patient Name: Date of Service: [name], [place]. 02/10/2022 9:30 A M Medical Record Number: [id number] Patient Account Number: [id number] Date of Birth/Sex: Treating RN: Sep 25, 1975 (46 y.o. [name] Primary Care Jamiere Gulas: [name] Other Clinician: Referring Jessie Schrieber: Treating Krystale Rinkenberger/Extender: [name] in Treatment: 77 Active Problems Location of Pain Severity and Description of Pain Patient Has Paino Yes Site Locations Pain Location: Generalized Pain With Dressing Change: No Duration of the Pain. Constant / Intermittento Constant Rate the pain. Current Pain Level: 3 Worst Pain Level: 10 Least Pain Level: 3 Tolerable Pain Level: 3 Character of Pain Describe the Pain: Difficult to Pinpoint Pain Management and Medication Current Pain Management: Medication: Yes Cold Application: No Rest: Yes Massage: No Activity: No T.E.N.S.: No Heat Application: No Leg drop or  elevation: No Is the Current Pain Management Adequate: Inadequate How does your wound impact your activities of daily livingo Sleep: No Bathing: No Appetite: No Relationship With Others: No Bladder Continence: No Emotions: No Bowel Continence: No Work: No Toileting: No Drive: No Dressing: No Hobbies: No Electronic Signature(s) Signed: 02/10/2022 5:29:32 PM By: [name] Entered By: [name] on 02/10/2022 09:40:49 -------------------------------------------------------------------------------- Patient/Caregiver Education Details Patient Name: Date of Service: Ciro Backer 7/6/2023andnbsp9:30 [place] Record Number: [id number] Patient Account Number: [id number] Date  of Birth/Gender: Treating RN: Sep 13, 1975 (46 y.o. [name] Primary Care Physician: [name] Other Clinician: Referring Physician: Treating Physician/Extender: [name] in Treatment: 49 Education Assessment Education Provided To: Patient Education Topics Provided Wound/Skin Impairment: Methods: Explain/Verbal Responses: Reinforcements needed, State content correctly Electronic Signature(s) Signed: 02/10/2022 5:29:32 PM By: [name] Entered By: [name] on 02/10/2022 09:50:41 -------------------------------------------------------------------------------- Wound Assessment Details Patient Name: Date of Service: [name], [place]. 02/10/2022 9:30 A M Medical Record Number: [id number] Patient Account Number: [id number] Date of Birth/Sex: Treating RN: Jan 12, 1976 (46 y.o. [name] Primary Care Chidi Shirer: [name] Other Clinician: Referring Maresa Morash: Treating [name]/Extender: [name] in Treatment: 3 Wound Status Wound Number: 1 Primary Etiology: Pressure Ulcer Wound Location: Right Calcaneus Wound Status: Open Wounding Event: Pressure Injury Comorbid History: Asthma, Angina, Hypertension Date Acquired: 10/07/2019 Weeks Of Treatment: 82 Clustered Wound: No Photos Wound Measurements Length: (cm) 0.3 Width: (cm) 0.3 Depth: (cm) 0.1 Area: (cm) 0.071 Volume: (cm) 0.007 % Reduction in Area: 99.8% % Reduction in Volume: 100% Epithelialization: Medium (34-66%) Tunneling: No Undermining: No Wound Description Classification: Category/Stage III Wound Margin: Thickened Exudate Amount: Medium Exudate Type: Serosanguineous Exudate Color: red, brown Foul Odor After Cleansing: No Slough/Fibrino Yes Wound Bed Granulation Amount: Small (1-33%) Exposed Structure Granulation Quality: Pink Fascia Exposed: No Necrotic Amount: Large (67-100%) Fat Layer (Subcutaneous Tissue) Exposed:  Yes Necrotic Quality: Eschar, Adherent Slough Tendon Exposed: No Muscle Exposed: No Joint Exposed: No Bone Exposed: No Treatment Notes Wound #1 (Calcaneus) Wound Laterality: Right Cleanser Normal Saline Discharge Instruction: Cleanse the wound with Normal Saline prior to applying a clean dressing using gauze sponges, not tissue or cotton balls. Wound Cleanser Discharge Instruction: Cleanse the wound with wound cleanser prior to applying a clean dressing using gauze sponges, not tissue or cotton balls. Peri-Wound Care Topical Primary Dressing VENDAJE Secondary Dressing Zetuvit Plus 4x8 in Discharge Instruction: Apply over primary dressing as directed. Secured With Elastic Bandage 4 inch (ACE bandage) Discharge Instruction: Secure with ACE bandage as directed. Kerlix Roll Sterile, 4.5x3.1 (in/yd) Discharge Instruction: Secure with Kerlix as directed. Compression Wrap Compression Stockings Add-Ons Electronic Signature(s) Signed: 02/10/2022 4:34:44 PM By: Adline Peals Signed: 02/10/2022 5:29:32 PM By: [name] Entered By: Adline Peals on 02/10/2022 09:52:39 -------------------------------------------------------------------------------- Wound Assessment Details Patient Name: Date of Service: [name], [name] 02/10/2022 9:30 A M Medical Record Number: [id number] Patient Account Number: [id number] Date of Birth/Sex: Treating RN: 11/09/1975 (46 y.o. [name] Primary Care Raman Featherston: [name] Other Clinician: Referring Juno Alers: Treating Michaelina Blandino/Extender: [name] in Treatment: 49 Wound Status Wound Number: 2 Primary Etiology: Pressure Ulcer Wound Location: Left Calcaneus Wound Status: Open Wounding Event: Pressure Injury Comorbid History: Asthma, Angina, Hypertension Date Acquired: 10/07/2019 Weeks Of Treatment: 82 Clustered Wound: No Photos Wound Measurements Length: (cm) 1.5 Width: (cm) 0.5 Depth: (cm)  0.1 Area: (cm) 0.589 Volume: (cm) 0.059 % Reduction in Area: 97.8% % Reduction  in Volume: 99.8% Epithelialization: Small (1-33%) Tunneling: No Undermining: No Wound Description Classification: Category/Stage III Wound Margin: Distinct, outline attached Exudate Amount: Medium Exudate Type: Serosanguineous Exudate Color: red, brown Foul Odor After Cleansing: No Slough/Fibrino Yes Wound Bed Granulation Amount: Medium (34-66%) Exposed Structure Granulation Quality: Pink, Pale Fascia Exposed: No Necrotic Amount: Medium (34-66%) Fat Layer (Subcutaneous Tissue) Exposed: No Necrotic Quality: Adherent Slough Tendon Exposed: No Muscle Exposed: No Joint Exposed: No Bone Exposed: No Treatment Notes Wound #2 (Calcaneus) Wound Laterality: Left Cleanser Normal Saline Discharge Instruction: Cleanse the wound with Normal Saline prior to applying a clean dressing using gauze sponges, not tissue or cotton balls. Wound Cleanser Discharge Instruction: Cleanse the wound with wound cleanser prior to applying a clean dressing using gauze sponges, not tissue or cotton balls. Peri-Wound Care Zinc Oxide Ointment 30g tube Discharge Instruction: Apply Zinc Oxide to periwound with each dressing change Topical Primary Dressing VENDAJE Secondary Dressing Optifoam Non-Adhesive Dressing, 4x4 in Discharge Instruction: Apply over primary dressing as directed. Zetuvit Plus 4x8 in Discharge Instruction: Apply over primary dressing as directed. Secured With [employer], 4.5x3.1 (in/yd) Discharge Instruction: Secure with Kerlix as directed. 41M Medipore H Soft Cloth Surgical T ape, 4 x 10 (in/yd) Discharge Instruction: Secure with tape as directed. Compression Wrap Compression Stockings Add-Ons Electronic Signature(s) Signed: 02/10/2022 4:34:44 PM By: Samuella Bruin Signed: 02/10/2022 5:29:32 PM By: [name] RN Entered By: Samuella Bruin on 02/10/2022  09:53:09 -------------------------------------------------------------------------------- Vitals Details Patient Name: Date of Service: [name], [name] 02/10/2022 9:30 A M Medical Record Number: [id number] Patient Account Number: [id number] Date of Birth/Sex: Treating RN: Dec 19, 1975 (46 y.o. [name] Primary Care Avnoor Koury: Dorinda Hill Other Clinician: Referring Daly Whipkey: Treating Wyndi Northrup/Extender: Jana Hakim in Treatment: 70 Vital Signs Time Taken: 09:37 Temperature (F): 98.5 Height (in): 69 Pulse (bpm): 81 Weight (lbs): 280 Respiratory Rate (breaths/min): 16 Body Mass Index (BMI): 41.3 Blood Pressure (mmHg): 153/80 Reference Range: 80 - 120 mg / dl Electronic Signature(s) Signed: 02/10/2022 5:29:32 PM By: [name] RN Entered By: [name] on 02/10/2022 09:39:34

## 2022-02-11 NOTE — Progress Notes (Signed)
[name] ([id number]) Visit Report for 02/10/2022 Chief Complaint Document Details Patient Name: Date of Service: [name], [place]. 02/10/2022 9:30 A M Medical Record Number: [id number] Patient Account Number: [id number] Date of Birth/Sex: Treating RN: June 04, 1976 (46 y.o. [name] Primary Care Provider: [name] Other Clinician: Referring Provider: Treating Provider/Extender: [name] in Treatment: 45 Information Obtained from: Patient Chief Complaint Bilateral Plantar Foot Ulcers Electronic Signature(s) Signed: 02/10/2022 10:23:12 AM By: Fredirick Maudlin MD FACS Entered By: Fredirick Maudlin on 02/10/2022 10:23:11 -------------------------------------------------------------------------------- Cellular or Tissue Based Product Details Patient Name: Date of Service: [name], [place]. 02/10/2022 9:30 A M Medical Record Number: [id number] Patient Account Number: [id number] Date of Birth/Sex: Treating RN: May 20, 1976 (46 y.o. [name] Primary Care Provider: [name] Other Clinician: Referring Provider: Treating Provider/Extender: [name] in Treatment: 48 Cellular or Tissue Based Product Type Wound #1 Right Calcaneus Applied to: Performed By: Physician Fredirick Maudlin, MD Cellular or Tissue Based Product Type: Other Level of Consciousness (Pre-procedure): Awake and Alert Pre-procedure Verification/Time Out Yes - 09:57 Taken: Location: genitalia / hands / feet / multiple digits Wound Size (sq cm): 0.09 Product Size (sq cm): 4 Waste Size (sq cm): 0 Amount of Product Applied (sq cm): 4 Instrument Used: Forceps, Scissors Lot #: [id number] Order #: 8 Expiration Date: 08/18/2024 Fenestrated: No Reconstituted: No Secured: Yes Secured With: Steri-Strips Dressing Applied: Yes Primary Dressing: Adaptic;gauze Procedural Pain: 0 Post Procedural Pain: 0 Response to Treatment: Procedure was tolerated  well Level of Consciousness (Post- Awake and Alert procedure): Post Procedure Diagnosis Same as Pre-procedure Electronic Signature(s) Signed: 02/10/2022 5:29:32 PM By: [name] Signed: 02/11/2022 7:32:29 AM By: Fredirick Maudlin MD FACS Entered By: [name] on 02/10/2022 17:23:41 -------------------------------------------------------------------------------- Cellular or Tissue Based Product Details Patient Name: Date of Service: [name], [place]. 02/10/2022 9:30 A M Medical Record Number: [id number] Patient Account Number: [id number] Date of Birth/Sex: Treating RN: Jun 15, 1976 (46 y.o. [name] Primary Care Provider: [name] Other Clinician: Referring Provider: Treating Provider/Extender: [name] in Treatment: 76 Cellular or Tissue Based Product Type Wound #2 Left Calcaneus Applied to: Performed By: Physician Fredirick Maudlin, MD Cellular or Tissue Based Product Type: Other Level of Consciousness (Pre-procedure): Awake and Alert Pre-procedure Verification/Time Out Yes - 09:57 Taken: Location: genitalia / hands / feet / multiple digits Wound Size (sq cm): 0.75 Product Size (sq cm): 4 Waste Size (sq cm): 0 Amount of Product Applied (sq cm): 4 Instrument Used: Forceps, Scissors Lot #: [id number] Order #: 8 Expiration Date: 08/18/2024 Fenestrated: No Reconstituted: No Secured: Yes Secured With: Steri-Strips Dressing Applied: Yes Primary Dressing: Adaptic;gauze Procedural Pain: 0 Post Procedural Pain: 0 Response to Treatment: Procedure was tolerated well Level of Consciousness (Post- Awake and Alert procedure): Post Procedure Diagnosis Same as Pre-procedure Notes Vendaje *** Trial Electronic Signature(s) Signed: 02/10/2022 5:29:32 PM By: [name] Signed: 02/11/2022 7:32:29 AM By: Fredirick Maudlin MD FACS Entered By: [name] on 02/10/2022  17:24:39 -------------------------------------------------------------------------------- Debridement Details Patient Name: Date of Service: [name], [place]. 02/10/2022 9:30 A M Medical Record Number: [id number] Patient Account Number: [id number] Date of Birth/Sex: Treating RN: Sep 26, 1975 (46 y.o. [name] Primary Care Provider: Other Clinician: [name] Referring Provider: Treating Provider/Extender: [name] in Treatment: 82 Debridement Performed for Assessment: Wound #1 Right Calcaneus Performed By: Physician Fredirick Maudlin, MD Debridement Type: Debridement Level of Consciousness (Pre-procedure): Awake and Alert Pre-procedure  Verification/Time Out Yes - 09:57 Taken: Start Time: 09:57 Pain Control: Lidocaine 4% T opical Solution T Area Debrided (L x W): otal 0.3 (cm) x 0.3 (cm) = 0.09 (cm) Tissue and other material debrided: Non-Viable, Callus Level: Non-Viable Tissue Debridement Description: Selective/Open Wound Instrument: Curette Bleeding: Minimum Hemostasis Achieved: Pressure End Time: 09:58 Procedural Pain: 0 Post Procedural Pain: 0 Response to Treatment: Procedure was tolerated well Level of Consciousness (Post- Awake and Alert procedure): Post Debridement Measurements of Total Wound Length: (cm) 0.3 Stage: Category/Stage III Width: (cm) 0.3 Depth: (cm) 0.1 Volume: (cm) 0.007 Character of Wound/Ulcer Post Debridement: Improved Post Procedure Diagnosis Same as Pre-procedure Electronic Signature(s) Signed: 02/10/2022 12:58:53 PM By: Fredirick Maudlin MD FACS Signed: 02/10/2022 5:29:32 PM By: [name] Entered By: [name] on 02/10/2022 10:01:29 -------------------------------------------------------------------------------- HPI Details Patient Name: Date of Service: [name], [place]. 02/10/2022 9:30 A M Medical Record Number: [id number] Patient Account Number: [id number] Date of Birth/Sex: Treating  RN: 1976-02-17 (46 y.o. [name] Primary Care Provider: [name] Other Clinician: Referring Provider: Treating Provider/Extender: [name] in Treatment: 56 History of Present Illness HPI Description: Wounds are12/03/2020 upon evaluation today patient presents for initial inspection here in our clinic concerning issues he has been having with the bottoms of his feet bilaterally. He states these actually occurred as wounds when he was hospitalized for 5 months secondary to Covid. He was apparently with tilting bed where he was in an upright position quite frequently and apparently this occurred in some way shape or form during that time. Fortunately there is no sign of active infection at this time. No fevers, chills, nausea, vomiting, or diarrhea. With that being said he still has substantial wounds on the plantar aspects of his feet Theragen require quite a bit of work to get these to heal. He has been using Santyl currently though that is been problematic both in receiving the medication as well as actually paid for it as it is become quite expensive. Prior to the experience with Covid the patient really did not have any major medical problems other than hypertension he does have some mild generalized weakness following the Covid experience. 07/22/2020 on evaluation today patient appears to be doing okay in regard to his foot ulcers I feel like the wound beds are showing signs of better improvement that I do believe the Iodoflex is helping in this regard. With that being said he does have a lot of drainage currently and this is somewhat blue/green in nature which is consistent with Pseudomonas. I do think a culture today would be appropriate for [place] to evaluate and see if that is indeed the case I would likely start him on antibiotic orally as well he is not allergic to Cipro knows of no issues he has had in the past 12/21; patient was admitted to the clinic  earlier this month with bilateral presumed pressure ulcers on the bottom of his feet apparently related to excessive pressure from a tilt table arrangement in the intensive care unit. Patient relates this to being on ECMO but I am not really sure that is exactly related to that. I must say I have never seen anything like this. He has fairly extensive full-thickness wounds extending from his heel towards his midfoot mostly centered laterally. There is already been some healing distally. He does not appear to have an arterial issue. He has been using gentamicin to the wound surfaces with Iodoflex to help with ongoing debridement 1/6;  this is a patient with pressure ulcers on the bottom of his feet related to excessive pressure from a standing position in the intensive care unit. He is complaining of a lot of pain in the right heel. He is not a diabetic. 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 We have been using Iodoflex to help prepare the surfaces of both wounds for an advanced treatment product. He is nonambulatory spending most of his time in a wheelchair I have asked him not to propel the wheelchair with his heels 1/13; in general his wounds look better not much surface area change we have been using Iodoflex as of last week. I did an x-ray of the right heel as the patient was complaining of pain. I had some thoughts about a stress fracture perhaps Achilles tendon problems however what it showed was erosive changes along the inferior aspect of the calcaneus he now has a MRI booked for 1/20. 1/20; in general his wounds continue to be better. Some improvement in the large narrow areas proximally in his foot. He is still complaining of pain in the right heel and tenderness in certain areas of this wound. His MRI is tonight. I am not just looking for osteomyelitis that was brought up on the x-ray I am wondering about stress fractures, tendon ruptures etc. He has no such findings  on the left. Also noteworthy is that the patient had critical illness neuropathy and some of the discomfort may be actual improvement in nerve function I am just not sure. These wounds were initially in the setting of severe critical illness related to COVID-19. He was put in a standing position. He may have also been on pressors at the point contributing to tissue ischemia. By his description at some point these wounds were grossly necrotic extending proximally up into the Achilles part of his heel. I do not know that I have ever really seen pictures of them like this although they may exist in epic We have ordered Tri layer Oasis. I am trying to stimulate some granulation in these areas. This is of course assuming the MRI is negative for infection 1/27; since the patient was last here he saw Dr. Juleen China of infectious disease. He is planned for vancomycin and ceftriaxone. Prior operative culture grew MSSA. Also ordered baseline lab work. He also ordered arterial studies although the ABIs in our clinic were normal as well as his clinical exam these were normal I do not think he needs to see vascular surgery. His ABIs at the [facility] were 1.22 in the right triphasic waveforms with a normal TBI of 1.15 on the left ABI of 1.22 with triphasic waveforms and a normal TBI of 1.08. Finally he saw Dr. Amalia Hailey who will follow him in for 2 months. At this point I do not think he felt that he needed a procedure on the right calcaneal bone. Dr. Juleen China is elected for broad-spectrum antibiotic The patient is still having pain in the right heel. He walks with a walker 2/3; wounds are generally smaller. He is tolerating his IV antibiotics. I believe this is vancomycin and ceftriaxone. We are still waiting for Oasis burn in terms of his out-of-pocket max which he should be meeting soon given the IV antibiotics, MRIs etc. I have asked him to check in on this. We are using silver collagen in the meantime the wounds look  better 2/10; tolerating IV vancomycin and Rocephin. We are waiting to apply for Oasis. Although I am  not really sure where he is in his out-of-pocket max. 2/17 started the first application of Oasis trilayer. Still on antibiotics. The wounds have generally look better. The area on the left has a little more surface slough requiring debridement [id number]; second application of Oasis trilayer. The wound surface granulation is generally look better. The area on the left with undermining laterally I think is come in a bit. 10/08/2020 upon evaluation today patient is here today for [employer] application #3. Fortunately he seems to be doing extremely well with regard to this and we are seeing a lot of new epithelial growth which is great news. Fortunately there is no signs of active infection at this time. 10/16/2020 upon evaluation today patient appears to be doing well with regard to his foot ulcers. Do believe the Oasis has been of benefit for him. I do not see any signs of infection right now which is great news and I think that he has a lot of new epithelial growth which is great to see as well. The patient is very pleased to hear all of this. I do think we can proceed with the Oasis trilayer #4 today. 3/18; not as much improvement in these areas on his heels that I was hoping. I did reapply trilateral Oasis today the tissue looks healthier but not as much fill in as I was hoping. 3/25; better looking today I think this is come in a bit the tissue looks healthier. Triple layer Oasis reapplied #6 4/1; somewhat better looking definitely better looking surface not as much change in surface area as I was hoping. He may be spending more time [place] on days then he needs to although he does have heel offloading boots. Triple layer Oasis reapplied #7 4/7; unfortunately apparently [facility] will not approve any further Oasis which is unfortunate since the patient did respond nicely both in terms of  the condition of the wound bed as well as surface area. There is still some drainage coming from the wound but not a lot there does not appear to be any infection 4/15; we have been using Hydrofera Blue. He continues to have nice rims of epithelialization on the right greater than the left. The left the epithelialization is coming from the tip of his heel. There is moderate drainage. In this that concerns me about a total contact cast. There is no evidence of infection 4/29; patient has been using Hydrofera Blue with dressing changes. He has no complaints or issues today. 5/5; using Hydrofera Blue. I actually think that he looks marginally better than the last time I saw this 3 weeks ago. There are rims of epithelialization on the left thumb coming from the medial side on the right. Using Hydrofera Blue 5/12; using Hydrofera Blue. These continue to make improvements in surface area. His drainage was not listed as severe I therefore went ahead and put a cast on the left foot. Right foot we will continue to dress his previous 5/16; back for first total contact cast change. He did not tolerate this particularly well cast injury on the anterior tibia among other issues. Difficulty sleeping. I talked him about this in some detail and afterwards is elected to continue. I told him I would like to have a cast on for 3 weeks to see if this is going to help at all. I think he agreed 5/19; I think the wound is better. There is no tunneling towards his midfoot. The undermining medially also looks better.  He has a rim of new skin distally. I think we are making progress here. The area on the left also continues to look somewhat better to me using Hydrofera Blue. He has a list of complaints about the cast but none of them seem serious 5/26; patient presents for 1 week follow-up. He has been using a total contact cast and tolerating this well. Hydrofera Blue is the main dressing used. He denies signs of  infection. 6/2 Hydrofera Blue total contact cast on the left. These were large ulcers that formed in intensive care unit where the patient was recovering from [place]. May have had something to do with being ventilated in an upright positiono Pressors etc. We have been able to get the areas down considerably and a viable surface. There is some epithelialization in both sides. Note made of drainage 6/9; changed to [facility] last time because of drainage. He arrives with better looking surfaces and dimensions on the left than the right. Paradoxically the right actually probes more towards his midfoot the left is largely close down but both of these look improved. Using a total contact cast on the left 6/16; complex wounds on his bilateral plantar heels which were initially pressure injury from a stay in the ICU with COVID. We have been using silver alginate most recently. His dimensions of come in quite dramatically however not recently. We have been putting the left foot in a total contact cast 6/23; complex wounds on the bilateral plantar heels. I been putting the left in the cast paradoxically the area on the right is the one that is going towards closure at a faster rate. Quite a bit of drainage on the left. The patient went to see Dr. Amalia Hailey who said he was going to standby for skin grafts. I had actually considered sending him for skin grafts however he would be mandatorily off his feet for a period of weeks to months. I am thinking that the area on the right is going to close on its own the area on the left has been more stubborn even though we have him in a total contact cast 6/30; took him out of a total contact cast last week is the right heel seem to be making better progress than the left where I was placing the cast. We are using silver alginate. Both wounds are smaller right greater than left 7/12; both wounds look as though they are making some progress. We are using silver alginate.  Heel offloading boots 7/26; very gradual progress especially on the right. Using silver alginate. He is wearing heel offloading boots 8/18; he continues to close these wounds down very gradually. Using silver alginate. The problem polymen being definitive about this is areas of what appears to be callus around the margins. This is not a 100% of the area but certainly sizable especially on the right 9/1; bilateral plantar feet wounds secondary to prolonged pressure while being ventilated for COVID-19 in an upright position. Essentially pressure ulcers on the bottom of his feet. He is made substantial progress using silver alginate. 9/14; bilateral plantar feet wounds secondary to prolonged pressure. Making progress using silver alginate. 9/29 bilateral plantar feet wounds secondary to prolonged pressure. I changed him to Iodoflex last week. MolecuLight showing reddened blush fluorescence 10/11; patient presents for follow-up. He has no issues or complaints today. He denies signs of infection. He continues to use Iodoflex and antibiotic ointment to the wound beds. 10/27; 2-week follow-up. No evidence of infection. He has  callus and thick dry skin around the wound margins we have been using Iodoflex and Bactroban which was in response to a moderate left MolecuLight reddish blush fluorescence. 11/10; 2-week follow-up. Wound margins again have thick callus however the measurements of the actual wound sites are a lot smaller. Everything looks reasonably healthy here. We have been using Iodoflex He was approved for prime matrix but I have elected to delay this given the improvement in the surface area. Hopefully I will not regret that decision as were getting close to the end of the year in terms of insurance payment 12/8; 2-week follow-up. Wounds are generally smaller in size. These were initially substantial wounds extending into the forefoot all the way into the heel on the bilateral plantar feet. They  are now both located on the plantar heel distal aspect both of these have a lot of callus around the wounds I used a #5 curette to remove this on the right and the left also some subcutaneous debris to try and get the wound edges were using Iodoflex. He has heel offloading shoe 12/22; 2-week follow-up. Not really much improvement. He has thick callus around the outer edges of both wounds. I remove this there is some nonviable subcutaneous tissue as well. We have been using Iodoflex. Her intake nurse and myself spontaneously thought of a total contact cast I went back in May. At that time we really were not seeing much of an improvement with a cast although the wound was in a much different situation I would like to retry this in 2 weeks and I discussed this with the patient 08/12/2021; the patient has had some improvement with the Iodoflex. The the area on the left heel plantar more improved than the right. I had to put him in a total contact cast on the left although I decided to put that off for 2 weeks. I am going to change his primary dressing to silver collagen. I think in both areas he has had some improvement most of the healing seems to be more proximal in the heel. The wounds are in the mid aspect. A lot of thick callus on the right heel however. 1/19; we are using silver collagen on both plantar heel areas. He has had some improvement today. The left did not require any debridement. He still had some eschar on the right that was debrided but both seem to have contracted. I did not put it total contact cast on him today 2/2 we have been using silver collagen. The area on the right plantar heel has areas that appear to be epithelialized interspersed with dry flaking callus and dry skin. I removed this. This really looks better than on the other side. On the left still a large area with raised edges and debris on the surface. The patient states he is in the heel offloading boots for a prolonged  period of time and really does not use any other footwear 2/6; patient presents for first cast exchange. He has no issues or complaints today. 2/9; not much change in the left foot wound with 1 week of a cast we are using silver collagen. Silver collagen on the right side. The right side has been the better wound surface. We will reapply the total contact cast on the left 2/16; not much improvement on either side I been using silver collagen with a total contact cast on the left. I'm changing the Hydrofera Blue still with a total contact cast on the left 2/23;  some improvement on both sides. Disappointing that he has thick callus around the area that we are putting in a total contact cast on the left. We've been using Hydrofera Blue on both wound areas. This is a man who at essentially pressure ulcers in addition to ischemia caused by medications to support his blood pressure (pressors) in the ICU. He was being ventilated in the standing position for severe Covid. A [place] the wounds extended across his entire foot but are now localized to his plantar heels bilaterally. We have made progress however neither areas healed. I continue to think the total contact cast is helped albeit painstakingly slowly. He has never wanted a plastic surgery consult although I don't know that they would be interested in grafting in area in this location. 10/07/2021: Continued improvement bilaterally. There is still some callus around the left wound, despite the total contact cast. He has some increased pain in his right midfoot around 1 particular area. This has been painful in the past but seems to be a little bit worse. When his cast was removed today, there was an area on the heel of the left foot that looks a bit macerated. He is also complaining of pain in his left thigh and hip which he thinks is secondary to the limb length discrepancy caused by the cast. 10/14/2021: He continues to improve. A little bit less callus  around the left wound. He continues to endorse pain in his right midfoot, but this is not as significant as it was last week. The maceration on his left heel is improved. 10/21/2021: Continued improvement to both wounds. The maceration on his left heel is no longer evident. Less callus bilaterally. Epithelialization progressing. 10/28/2021: Significant improvement this week. The right sided wound is nearly closed with just a small open area at the middle. No maceration seen on the left heel. Continued epithelialization on both sides. No concern for infection. 11/04/2021: T oday, the wounds were measured a little bit differently and come out as larger, but I actually think they are about the same to potentially even smaller, particularly on the left. He continues to accumulate some callus on the right. 11/11/2021: T oday, the patient is expressing some concern that the left wound, despite being in the total contact cast, is not progressing at the same rate as the 1 on the right. He is interested in trying a week without the cast to see how the wound does. The wounds are roughly the same size as last week, with the right perhaps being a little bit smaller. He continues to build up callus on both sites. 11/18/2021: Last week, I permitted the patient to go without his total contact cast, just to see if the cast was really making any difference. Today, both wounds have deteriorated to some extent, suggesting that the cast is providing benefit, at least on the left. Both are larger and have accumulated callus, slough, and other debris. 11/26/2021: I debrided both wounds quite aggressively last week in an effort to stimulate the healing cascade. This appears to have been effective as the left sided wound is a full centimeter shorter in length. Although the right was measured slightly larger, on inspection, it looks as though an area of epithelialized tissue was included in the measurements. We have been using  PolyMem Ag on the wound surfaces with a total contact cast to the left. 12/02/2021: It appears that the intake personnel are including epithelialized tissue in his wound measurements; the right wound is  almost completely epithelialized; there is just a crater at the proximal midfoot with some open areas. On the left, he has built up some callus, but the overall wound surface looks good. There is some senescent skin around the wound margin. He has been in PolyMem Ag bilaterally with a total contact cast on the left. 12/09/2021: The right wound is nearly closed; there is just a small open area at the mid calcaneus. On the left, the wound is smaller with minimal callus buildup. No significant drainage. 12/16/2021: The right calcaneal wound remains minimally open at the mid calcaneus; the rest has epithelialized. On the left, the wound is also a little bit smaller. There is some senescent tissue on the periphery. He is getting his first application of a trial skin substitute called Vendaje today. 12/23/2021: The wound on his right calcaneus is nearly closed; there is just a small area at the most distal aspect of the calcaneus that is open. On the left, the area where we applied to the skin substitute has a healthier-looking bed of granulation tissue. The wound dimensions are not significantly different on this side but the wound surface is improved. 12/30/2021: The wound on the right calcaneus has not changed significantly aside from some accumulation of callus. On the left, the open area is smaller and continues to have an improved surface. He continues to accumulate callus around the wound. He is here for his third application of Vendaje. 01/06/2022: The right calcaneal wound is down to just a couple of millimeters. He continues to accumulate periwound callus. He unfortunately got his cast wet earlier in the week and his left foot is macerated, resulting in some superficial skin loss just distal to the open  wound. The open wound itself, however, is much smaller and has a healthier appearing surface. He is here for his fourth application of Vendaje. 01/13/2022: The right calcaneal wound is about the same. Unfortunately, once again, his cast got wet and his foot is again macerated. This is caused the left calcaneal wound to enlarge. He is here for his fifth application of Vendaje. 01/20/2022: The right calcaneal wound is very small. There is some periwound callus accumulation. He purchased a new cast protector last week and this has been effective in avoiding the maceration that has been occurring on the left. The left calcaneal wound is narrower and has a healthy and viable-appearing surface. He is here for his 6 application of Vendaje. 01/27/2022: The right calcaneal wound is down to just a pinhole. There is some periwound slough and callus. On the left, the wound is narrower and shorter by about a centimeter. The surface is robust and viable-appearing. Unfortunately, the rep for the trial skin substitute product did not provide any for [place] to use today. 02/04/2022: The right calcaneal wound remains unchanged. There is more accumulated callus. On the left, although the intake nurse measured it a little bit longer, it looks about the same to me. The surface has a layer of slough, but underneath this, there is good granulation tissue. 02/10/2022: The right calcaneus wound is nearly closed. There is still some callus that builds up around the site. The left side looks about the same in terms of dimensions, but the surface is more robust and vital-appearing. Electronic Signature(s) Signed: 02/10/2022 10:23:55 AM By: [name] MD FACS Entered By: [name] on 02/10/2022 10:23:55 -------------------------------------------------------------------------------- Physical Exam Details Patient Name: Date of Service: [name], [name] 02/10/2022 9:30 A M Medical Record Number: [id number] Patient  Account  Number: [id number] Date of Birth/Sex: Treating RN: 06-Jan-1976 (46 y.o. [name] Primary Care Provider: [name] Other Clinician: Referring Provider: Treating Provider/Extender: [name] in Treatment: 17 Constitutional Hypertensive, asymptomatic. . . . No acute distress.Marland Kitchen Respiratory Normal work of breathing on room air.. Notes 02/10/2022: The right calcaneus wound is nearly closed. There is still some callus that builds up around the site. The left side looks about the same in terms of dimensions, but the surface is more robust and vital-appearing. Electronic Signature(s) Signed: 02/10/2022 10:24:23 AM By: Fredirick Maudlin MD FACS Entered By: Fredirick Maudlin on 02/10/2022 10:24:23 -------------------------------------------------------------------------------- Physician Orders Details Patient Name: Date of Service: [name], [place]. 02/10/2022 9:30 A M Medical Record Number: [id number] Patient Account Number: [id number] Date of Birth/Sex: Treating RN: 26-Sep-1975 (46 y.o. [name] Primary Care Provider: [name] Other Clinician: Referring Provider: Treating Provider/Extender: [name] in Treatment: 44 Verbal / Phone Orders: No Diagnosis Coding ICD-10 Coding Code Description L97.512 Non-pressure chronic ulcer of other part of right foot with fat layer exposed L97.522 Non-pressure chronic ulcer of other part of left foot with fat layer exposed Follow-up Appointments ppointment in 1 week. - Dr [name] - Room 3 Wednesday 7/12 at 9:30am and Thursday 20th at 9:30am Return A Cellular or Tissue Based Products Cellular or Tissue Based Product Type: - Vendaje #8 (trial) ( Applied to Right and Left heels) Cellular or Tissue Based Product applied to wound bed, secured with steri-strips, cover with A daptic or Mepitel. (DO NOT REMOVE). Bathing/ Shower/ Hygiene May shower with protection but do not get wound dressing(s)  wet. - Cover with cast protector (can purchase cast protector at [facility] or [facility] ) Edema Control - Lymphedema / SCD / Other Bilateral Lower Extremities Elevate legs to the level of the heart or above for 30 minutes daily and/or when sitting, a frequency of: - throughout the day Avoid standing for long periods of time. Moisturize legs daily. - right leg and foot every night. Off-Loading Total Contact Cast to Left Lower Extremity Wedge shoe to: - Globoped Shoe to right foot and left foot when not in TCC Other: - keep pressure off of the bottom of your feet Additional Orders / Instructions Follow Nutritious Diet Wound Treatment Wound #1 - Calcaneus Wound Laterality: Right Cleanser: Normal Saline (Generic) Every Other Day/30 Days Discharge Instructions: Cleanse the wound with Normal Saline prior to applying a clean dressing using gauze sponges, not tissue or cotton balls. Cleanser: Wound Cleanser (Generic) Every Other Day/30 Days Discharge Instructions: Cleanse the wound with wound cleanser prior to applying a clean dressing using gauze sponges, not tissue or cotton balls. Prim Dressing: VENDAJE ary Every Other Day/30 Days Secondary Dressing: Zetuvit Plus 4x8 in (Generic) Every Other Day/30 Days Discharge Instructions: Apply over primary dressing as directed. Secured With: Elastic Bandage 4 inch (ACE bandage) (Generic) Every Other Day/30 Days Discharge Instructions: Secure with ACE bandage as directed. Secured With: [employer], 4.5x3.1 (in/yd) (Generic) Every Other Day/30 Days Discharge Instructions: Secure with Kerlix as directed. Wound #2 - Calcaneus Wound Laterality: Left Cleanser: Normal Saline (Generic) 1 x Per Week/30 Days Discharge Instructions: Cleanse the wound with Normal Saline prior to applying a clean dressing using gauze sponges, not tissue or cotton balls. Cleanser: Wound Cleanser 1 x Per Week/30 Days Discharge Instructions: Cleanse the wound with wound cleanser  prior to applying a clean dressing using gauze sponges, not tissue or cotton balls. Peri-Wound Care: Zinc  Oxide Ointment 30g tube 1 x Per Week/30 Days Discharge Instructions: Apply Zinc Oxide to periwound with each dressing change Prim Dressing: VENDAJE ary 1 x Per Week/30 Days Secondary Dressing: Optifoam Non-Adhesive Dressing, 4x4 in (Generic) 1 x Per Week/30 Days Discharge Instructions: Apply over primary dressing as directed. Secondary Dressing: Zetuvit Plus 4x8 in (Generic) 1 x Per Week/30 Days Discharge Instructions: Apply over primary dressing as directed. Secured With: [employer], 4.5x3.1 (in/yd) (Generic) 1 x Per Week/30 Days Discharge Instructions: Secure with Kerlix as directed. Secured With: 68M Medipore H Soft Cloth Surgical T ape, 4 x 10 (in/yd) (Generic) 1 x Per Week/30 Days Discharge Instructions: Secure with tape as directed. Electronic Signature(s) Signed: 02/10/2022 12:58:53 PM By: Fredirick Maudlin MD FACS Entered By: Fredirick Maudlin on 02/10/2022 10:28:44 -------------------------------------------------------------------------------- Problem List Details Patient Name: Date of Service: [name], [name] 02/10/2022 9:30 A M Medical Record Number: [id number] Patient Account Number: [id number] Date of Birth/Sex: Treating RN: 1976/04/23 (46 y.o. [name] Primary Care Provider: [name] Other Clinician: Referring Provider: Treating Provider/Extender: [name] in Treatment: 63 Active Problems ICD-10 Encounter Code Description Active Date MDM Diagnosis L97.512 Non-pressure chronic ulcer of other part of right foot with fat layer exposed 09/03/2020 No Yes L97.522 Non-pressure chronic ulcer of other part of left foot with fat layer exposed 09/03/2020 No Yes Inactive Problems ICD-10 Code Description Active Date Inactive Date L89.893 Pressure ulcer of other site, stage 3 07/15/2020 07/15/2020 M62.81 Muscle weakness  (generalized) 07/15/2020 07/15/2020 I10 Essential (primary) hypertension 07/15/2020 07/15/2020 M86.171 Other acute osteomyelitis, right ankle and foot 09/03/2020 09/03/2020 Resolved Problems Electronic Signature(s) Signed: 02/10/2022 10:22:56 AM By: Fredirick Maudlin MD FACS Entered By: Fredirick Maudlin on 02/10/2022 10:22:56 -------------------------------------------------------------------------------- Progress Note Details Patient Name: Date of Service: [name], [place]. 02/10/2022 9:30 A M Medical Record Number: [id number] Patient Account Number: [id number] Date of Birth/Sex: Treating RN: 07/28/1976 (46 y.o. [name] Primary Care Provider: [name] Other Clinician: Referring Provider: Treating Provider/Extender: [name] in Treatment: 7 Subjective Chief Complaint Information obtained from Patient Bilateral Plantar Foot Ulcers History of Present Illness (HPI) Wounds are12/03/2020 upon evaluation today patient presents for initial inspection here in our clinic concerning issues he has been having with the bottoms of his feet bilaterally. He states these actually occurred as wounds when he was hospitalized for 5 months secondary to Covid. He was apparently with tilting bed where he was in an upright position quite frequently and apparently this occurred in some way shape or form during that time. Fortunately there is no sign of active infection at this time. No fevers, chills, nausea, vomiting, or diarrhea. With that being said he still has substantial wounds on the plantar aspects of his feet Theragen require quite a bit of work to get these to heal. He has been using Santyl currently though that is been problematic both in receiving the medication as well as actually paid for it as it is become quite expensive. Prior to the experience with Covid the patient really did not have any major medical problems other than hypertension he does have some mild  generalized weakness following the Covid experience. 07/22/2020 on evaluation today patient appears to be doing okay in regard to his foot ulcers I feel like the wound beds are showing signs of better improvement that I do believe the Iodoflex is helping in this regard. With that being said he does have a lot of drainage currently and this  is somewhat blue/green in nature which is consistent with Pseudomonas. I do think a culture today would be appropriate for [place] to evaluate and see if that is indeed the case I would likely start him on antibiotic orally as well he is not allergic to Cipro knows of no issues he has had in the past 12/21; patient was admitted to the clinic earlier this month with bilateral presumed pressure ulcers on the bottom of his feet apparently related to excessive pressure from a tilt table arrangement in the intensive care unit. Patient relates this to being on ECMO but I am not really sure that is exactly related to that. I must say I have never seen anything like this. He has fairly extensive full-thickness wounds extending from his heel towards his midfoot mostly centered laterally. There is already been some healing distally. He does not appear to have an arterial issue. He has been using gentamicin to the wound surfaces with Iodoflex to help with ongoing debridement 1/6; this is a patient with pressure ulcers on the bottom of his feet related to excessive pressure from a standing position in the intensive care unit. He is complaining of a lot of pain in the right heel. He is not a diabetic. 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 We have been using Iodoflex to help prepare the surfaces of both wounds for an advanced treatment product. He is nonambulatory spending most of his time in a wheelchair I have asked him not to propel the wheelchair with his heels 1/13; in general his wounds look better not much surface area change we have been using  Iodoflex as of last week. I did an x-ray of the right heel as the patient was complaining of pain. I had some thoughts about a stress fracture perhaps Achilles tendon problems however what it showed was erosive changes along the inferior aspect of the calcaneus he now has a MRI booked for 1/20. 1/20; in general his wounds continue to be better. Some improvement in the large narrow areas proximally in his foot. He is still complaining of pain in the right heel and tenderness in certain areas of this wound. His MRI is tonight. I am not just looking for osteomyelitis that was brought up on the x-ray I am wondering about stress fractures, tendon ruptures etc. He has no such findings on the left. Also noteworthy is that the patient had critical illness neuropathy and some of the discomfort may be actual improvement in nerve function I am just not sure. These wounds were initially in the setting of severe critical illness related to COVID-19. He was put in a standing position. He may have also been on pressors at the point contributing to tissue ischemia. By his description at some point these wounds were grossly necrotic extending proximally up into the Achilles part of his heel. I do not know that I have ever really seen pictures of them like this although they may exist in epic We have ordered Tri layer Oasis. I am trying to stimulate some granulation in these areas. This is of course assuming the MRI is negative for infection 1/27; since the patient was last here he saw Dr. Juleen China of infectious disease. He is planned for vancomycin and ceftriaxone. Prior operative culture grew MSSA. Also ordered baseline lab work. He also ordered arterial studies although the ABIs in our clinic were normal as well as his clinical exam these were normal I do not think he needs to  see vascular surgery. His ABIs at the [facility] were 1.22 in the right triphasic waveforms with a normal TBI of 1.15 on the left ABI of 1.22 with  triphasic waveforms and a normal TBI of 1.08. Finally he saw Dr. Amalia Hailey who will follow him in for 2 months. At this point I do not think he felt that he needed a procedure on the right calcaneal bone. Dr. Juleen China is elected for broad-spectrum antibiotic The patient is still having pain in the right heel. He walks with a walker 2/3; wounds are generally smaller. He is tolerating his IV antibiotics. I believe this is vancomycin and ceftriaxone. We are still waiting for Oasis burn in terms of his out-of-pocket max which he should be meeting soon given the IV antibiotics, MRIs etc. I have asked him to check in on this. We are using silver collagen in the meantime the wounds look better 2/10; tolerating IV vancomycin and Rocephin. We are waiting to apply for Oasis. Although I am not really sure where he is in his out-of-pocket max. 2/17 started the first application of Oasis trilayer. Still on antibiotics. The wounds have generally look better. The area on the left has a little more surface slough requiring debridement [id number]; second application of Oasis trilayer. The wound surface granulation is generally look better. The area on the left with undermining laterally I think is come in a bit. 10/08/2020 upon evaluation today patient is here today for [employer] application #3. Fortunately he seems to be doing extremely well with regard to this and we are seeing a lot of new epithelial growth which is great news. Fortunately there is no signs of active infection at this time. 10/16/2020 upon evaluation today patient appears to be doing well with regard to his foot ulcers. Do believe the Oasis has been of benefit for him. I do not see any signs of infection right now which is great news and I think that he has a lot of new epithelial growth which is great to see as well. The patient is very pleased to hear all of this. I do think we can proceed with the Oasis trilayer #4 today. 3/18; not as much improvement  in these areas on his heels that I was hoping. I did reapply trilateral Oasis today the tissue looks healthier but not as much fill in as I was hoping. 3/25; better looking today I think this is come in a bit the tissue looks healthier. Triple layer Oasis reapplied #6 4/1; somewhat better looking definitely better looking surface not as much change in surface area as I was hoping. He may be spending more time [place] on days then he needs to although he does have heel offloading boots. Triple layer Oasis reapplied #7 4/7; unfortunately apparently [facility] will not approve any further Oasis which is unfortunate since the patient did respond nicely both in terms of the condition of the wound bed as well as surface area. There is still some drainage coming from the wound but not a lot there does not appear to be any infection 4/15; we have been using Hydrofera Blue. He continues to have nice rims of epithelialization on the right greater than the left. The left the epithelialization is coming from the tip of his heel. There is moderate drainage. In this that concerns me about a total contact cast. There is no evidence of infection 4/29; patient has been using Hydrofera Blue with dressing changes. He has no complaints or issues  today. 5/5; using Hydrofera Blue. I actually think that he looks marginally better than the last time I saw this 3 weeks ago. There are rims of epithelialization on the left thumb coming from the medial side on the right. Using Hydrofera Blue 5/12; using Hydrofera Blue. These continue to make improvements in surface area. His drainage was not listed as severe I therefore went ahead and put a cast on the left foot. Right foot we will continue to dress his previous 5/16; back for first total contact cast change. He did not tolerate this particularly well cast injury on the anterior tibia among other issues. Difficulty sleeping. I talked him about this in some detail and  afterwards is elected to continue. I told him I would like to have a cast on for 3 weeks to see if this is going to help at all. I think he agreed 5/19; I think the wound is better. There is no tunneling towards his midfoot. The undermining medially also looks better. He has a rim of new skin distally. I think we are making progress here. The area on the left also continues to look somewhat better to me using Hydrofera Blue. He has a list of complaints about the cast but none of them seem serious 5/26; patient presents for 1 week follow-up. He has been using a total contact cast and tolerating this well. Hydrofera Blue is the main dressing used. He denies signs of infection. 6/2 Hydrofera Blue total contact cast on the left. These were large ulcers that formed in intensive care unit where the patient was recovering from [place]. May have had something to do with being ventilated in an upright positiono Pressors etc. We have been able to get the areas down considerably and a viable surface. There is some epithelialization in both sides. Note made of drainage 6/9; changed to [facility] last time because of drainage. He arrives with better looking surfaces and dimensions on the left than the right. Paradoxically the right actually probes more towards his midfoot the left is largely close down but both of these look improved. Using a total contact cast on the left 6/16; complex wounds on his bilateral plantar heels which were initially pressure injury from a stay in the ICU with COVID. We have been using silver alginate most recently. His dimensions of come in quite dramatically however not recently. We have been putting the left foot in a total contact cast 6/23; complex wounds on the bilateral plantar heels. I been putting the left in the cast paradoxically the area on the right is the one that is going towards closure at a faster rate. Quite a bit of drainage on the left. The patient went to see Dr.  Amalia Hailey who said he was going to [facility] for skin grafts. I had actually considered sending him for skin grafts however he would be mandatorily off his feet for a period of weeks to months. I am thinking that the area on the right is going to close on its own the area on the left has been more stubborn even though we have him in a total contact cast 6/30; took him out of a total contact cast last week is the right heel seem to be making better progress than the left where I was placing the cast. We are using silver alginate. Both wounds are smaller right greater than left 7/12; both wounds look as though they are making some progress. We are using silver alginate. Heel offloading boots  7/26; very gradual progress especially on the right. Using silver alginate. He is wearing heel offloading boots 8/18; he continues to close these wounds down very gradually. Using silver alginate. The problem polymen being definitive about this is areas of what appears to be callus around the margins. This is not a 100% of the area but certainly sizable especially on the right 9/1; bilateral plantar feet wounds secondary to prolonged pressure while being ventilated for COVID-19 in an upright position. Essentially pressure ulcers on the bottom of his feet. He is made substantial progress using silver alginate. 9/14; bilateral plantar feet wounds secondary to prolonged pressure. Making progress using silver alginate. 9/29 bilateral plantar feet wounds secondary to prolonged pressure. I changed him to Iodoflex last week. MolecuLight showing reddened blush fluorescence 10/11; patient presents for follow-up. He has no issues or complaints today. He denies signs of infection. He continues to use Iodoflex and antibiotic ointment to the wound beds. 10/27; 2-week follow-up. No evidence of infection. He has callus and thick dry skin around the wound margins we have been using Iodoflex and Bactroban which was in response to a  moderate left MolecuLight reddish blush fluorescence. 11/10; 2-week follow-up. Wound margins again have thick callus however the measurements of the actual wound sites are a lot smaller. Everything looks reasonably healthy here. We have been using Iodoflex He was approved for prime matrix but I have elected to delay this given the improvement in the surface area. Hopefully I will not regret that decision as were getting close to the end of the year in terms of insurance payment 12/8; 2-week follow-up. Wounds are generally smaller in size. These were initially substantial wounds extending into the forefoot all the way into the heel on the bilateral plantar feet. They are now both located on the plantar heel distal aspect both of these have a lot of callus around the wounds I used a #5 curette to remove this on the right and the left also some subcutaneous debris to try and get the wound edges were using Iodoflex. He has heel offloading shoe 12/22; 2-week follow-up. Not really much improvement. He has thick callus around the outer edges of both wounds. I remove this there is some nonviable subcutaneous tissue as well. We have been using Iodoflex. Her intake nurse and myself spontaneously thought of a total contact cast I went back in May. At that time we really were not seeing much of an improvement with a cast although the wound was in a much different situation I would like to retry this in 2 weeks and I discussed this with the patient 08/12/2021; the patient has had some improvement with the Iodoflex. The the area on the left heel plantar more improved than the right. I had to put him in a total contact cast on the left although I decided to put that off for 2 weeks. I am going to change his primary dressing to silver collagen. I think in both areas he has had some improvement most of the healing seems to be more proximal in the heel. The wounds are in the mid aspect. A lot of thick callus on the right  heel however. 1/19; we are using silver collagen on both plantar heel areas. He has had some improvement today. The left did not require any debridement. He still had some eschar on the right that was debrided but both seem to have contracted. I did not put it total contact cast on him today 2/2 we have  been using silver collagen. The area on the right plantar heel has areas that appear to be epithelialized interspersed with dry flaking callus and dry skin. I removed this. This really looks better than on the other side. On the left still a large area with raised edges and debris on the surface. 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 He has no issues or complaints today. 2/9; not much change in the left foot wound with 1 week of a cast we are using silver collagen. Silver collagen on the right side. The right side has been the better wound surface. We will reapply the total contact cast on the left 2/16; not much improvement on either side I been using silver collagen with a total contact cast on the left. I'm changing the Hydrofera Blue still with a total contact cast on the left 2/23; some improvement on both sides. Disappointing that he has thick callus around the area that we are putting in a total contact cast on the left. We've been using Hydrofera Blue on both wound areas. This is a man who at essentially pressure ulcers in addition to ischemia caused by medications to support his blood pressure (pressors) in the ICU. He was being ventilated in the standing position for severe Covid. A Shiley the wounds extended across his entire foot but are now localized to his plantar heels bilaterally. We have made progress however neither areas healed. I continue to think the total contact cast is helped albeit painstakingly slowly. He has never wanted a plastic surgery  consult although I don't know that they would be interested in grafting in area in this location. 10/07/2021: Continued improvement bilaterally. There is still some callus around the left wound, despite the total contact cast. He has some increased pain in his right midfoot around 1 particular area. This has been painful in the past but seems to be a little bit worse. When his cast was removed today, there was an area on the heel of the left foot that looks a bit macerated. He is also complaining of pain in his left thigh and hip which he thinks is secondary to the limb length discrepancy caused by the cast. 10/14/2021: He continues to improve. A little bit less callus around the left wound. He continues to endorse pain in his right midfoot, but this is not as significant as it was last week. The maceration on his left heel is improved. 10/21/2021: Continued improvement to both wounds. The maceration on his left heel is no longer evident. Less callus bilaterally. Epithelialization progressing. 10/28/2021: Significant improvement this week. The right sided wound is nearly closed with just a small open area at the middle. No maceration seen on the left heel. Continued epithelialization on both sides. No concern for infection. 11/04/2021: T oday, the wounds were measured a little bit differently and come out as larger, but I actually think they are about the same to potentially even smaller, particularly on the left. He continues to accumulate some callus on the right. 11/11/2021: T oday, the patient is expressing some concern that the left wound, despite being in the total contact cast, is not progressing at the same rate as the 1 on the right. He is interested in trying a week without the cast to see how the wound does. The wounds are roughly the same size as last  week, with the right perhaps being a little bit smaller. He continues to build up callus on both sites. 11/18/2021: Last week, I permitted the patient  to go without his total contact cast, just to see if the cast was really making any difference. Today, both wounds have deteriorated to some extent, suggesting that the cast is providing benefit, at least on the left. Both are larger and have accumulated callus, slough, and other debris. 11/26/2021: I debrided both wounds quite aggressively last week in an effort to stimulate the healing cascade. This appears to have been effective as the left sided wound is a full centimeter shorter in length. Although the right was measured slightly larger, on inspection, it looks as though an area of epithelialized tissue was included in the measurements. We have been using PolyMem Ag on the wound surfaces with a total contact cast to the left. 12/02/2021: It appears that the intake personnel are including epithelialized tissue in his wound measurements; the right wound is almost completely epithelialized; there is just a crater at the proximal midfoot with some open areas. On the left, he has built up some callus, but the overall wound surface looks good. There is some senescent skin around the wound margin. He has been in PolyMem Ag bilaterally with a total contact cast on the left. 12/09/2021: The right wound is nearly closed; there is just a small open area at the mid calcaneus. On the left, the wound is smaller with minimal callus buildup. No significant drainage. 12/16/2021: The right calcaneal wound remains minimally open at the mid calcaneus; the rest has epithelialized. On the left, the wound is also a little bit smaller. There is some senescent tissue on the periphery. He is getting his first application of a trial skin substitute called Vendaje today. 12/23/2021: The wound on his right calcaneus is nearly closed; there is just a small area at the most distal aspect of the calcaneus that is open. On the left, the area where we applied to the skin substitute has a healthier-looking bed of granulation tissue. The  wound dimensions are not significantly different on this side but the wound surface is improved. 12/30/2021: The wound on the right calcaneus has not changed significantly aside from some accumulation of callus. On the left, the open area is smaller and continues to have an improved surface. He continues to accumulate callus around the wound. He is here for his third application of Vendaje. 01/06/2022: The right calcaneal wound is down to just a couple of millimeters. He continues to accumulate periwound callus. He unfortunately got his cast wet earlier in the week and his left foot is macerated, resulting in some superficial skin loss just distal to the open wound. The open wound itself, however, is much smaller and has a healthier appearing surface. He is here for his fourth application of Vendaje. 01/13/2022: The right calcaneal wound is about the same. Unfortunately, once again, his cast got wet and his foot is again macerated. This is caused the left calcaneal wound to enlarge. He is here for his fifth application of Vendaje. 01/20/2022: The right calcaneal wound is very small. There is some periwound callus accumulation. He purchased a new cast protector last week and this has been effective in avoiding the maceration that has been occurring on the left. The left calcaneal wound is narrower and has a healthy and viable-appearing surface. He is here for his 6 application of Vendaje. 01/27/2022: The right calcaneal wound is down to just a  pinhole. There is some periwound slough and callus. On the left, the wound is narrower and shorter by about a centimeter. The surface is robust and viable-appearing. Unfortunately, the rep for the trial skin substitute product did not provide any for [place] to use today. 02/04/2022: The right calcaneal wound remains unchanged. There is more accumulated callus. On the left, although the intake nurse measured it a little bit longer, it looks about the same to me. The surface  has a layer of slough, but underneath this, there is good granulation tissue. 02/10/2022: The right calcaneus wound is nearly closed. There is still some callus that builds up around the site. The left side looks about the same in terms of dimensions, but the surface is more robust and vital-appearing. Patient History Information obtained from Patient. Family History Cancer - Maternal Grandparents, Diabetes - Father,Paternal Grandparents, Heart Disease - Maternal Grandparents, Hypertension - Father,Paternal Grandparents, Lung Disease - Siblings, No family history of Hereditary Spherocytosis, Kidney Disease, Seizures, Stroke, Thyroid Problems, Tuberculosis. Social History Never smoker, Marital Status - Married, Alcohol Use - Never, Drug Use - No History, Caffeine Use - Daily - tea, soda. Medical History Eyes Denies history of Cataracts, Glaucoma, Optic Neuritis Ear/Nose/Mouth/Throat Denies history of Chronic sinus problems/congestion, Middle ear problems Hematologic/Lymphatic Denies history of Anemia, Hemophilia, Human Immunodeficiency Virus, Lymphedema, Sickle Cell Disease Respiratory Patient has history of Asthma Denies history of Aspiration, Chronic Obstructive Pulmonary Disease (COPD), Pneumothorax, Sleep Apnea, Tuberculosis Cardiovascular Patient has history of Angina - with COVID, Hypertension Denies history of Arrhythmia, Congestive Heart Failure, Coronary Artery Disease, Deep Vein Thrombosis, Hypotension, Myocardial Infarction, Peripheral Arterial Disease, Peripheral Venous Disease, Phlebitis, Vasculitis Gastrointestinal Denies history of Cirrhosis , Colitis, Crohnoos, Hepatitis A, Hepatitis B, Hepatitis C Endocrine Denies history of Type I Diabetes, Type II Diabetes Genitourinary Denies history of End Stage Renal Disease Immunological Denies history of Lupus Erythematosus, Raynaudoos, Scleroderma Integumentary (Skin) Denies history of History of Burn Musculoskeletal Denies  history of Gout, Rheumatoid Arthritis, Osteoarthritis, Osteomyelitis Neurologic Denies history of Dementia, Neuropathy, Quadriplegia, Paraplegia, Seizure Disorder Oncologic Denies history of Received Chemotherapy, Received Radiation Psychiatric Denies history of Anorexia/bulimia, Confinement Anxiety Hospitalization/Surgery History - COVID PNA 07/22/2019- 11/14/2019. - 03/27/2020 wound debridement/ skin graft. Medical A Surgical History Notes nd Constitutional Symptoms (General Health) COVID PNA 07/22/2019-11/14/2019 [facility] ECMO, foot drop left foot , Genitourinary kidney stone Psychiatric anxiety Objective Constitutional Hypertensive, asymptomatic. No acute distress.. Vitals Time Taken: 9:37 AM, Height: 69 in, Weight: 280 lbs, BMI: 41.3, Temperature: 98.5 F, Pulse: 81 bpm, Respiratory Rate: 16 breaths/min, Blood Pressure: 153/80 mmHg. Respiratory Normal work of breathing on room air.. General Notes: 02/10/2022: The right calcaneus wound is nearly closed. There is still some callus that builds up around the site. The left side looks about the same in terms of dimensions, but the surface is more robust and vital-appearing. Integumentary (Hair, Skin) Wound #1 status is Open. Original cause of wound was Pressure Injury. The date acquired was: 10/07/2019. The wound has been in treatment 82 weeks. The wound is located on the Right Calcaneus. The wound measures 0.3cm length x 0.3cm width x 0.1cm depth; 0.071cm^2 area and 0.007cm^3 volume. There is Fat Layer (Subcutaneous Tissue) exposed. There is no tunneling or undermining noted. There is a medium amount of serosanguineous drainage noted. The wound margin is thickened. There is small (1-33%) pink granulation within the wound bed. There is a large (67-100%) amount of necrotic tissue within the wound bed including Eschar and Adherent Slough. Wound #2 status is Open. Original  cause of wound was Pressure Injury. The date acquired was: 10/07/2019. The  wound has been in treatment 82 weeks. The wound is located on the Left Calcaneus. The wound measures 1.5cm length x 0.5cm width x 0.1cm depth; 0.589cm^2 area and 0.059cm^3 volume. There is no tunneling or undermining noted. There is a medium amount of serosanguineous drainage noted. The wound margin is distinct with the outline attached to the wound base. There is medium (34-66%) pink, pale granulation within the wound bed. There is a medium (34-66%) amount of necrotic tissue within the wound bed including Adherent Slough. Assessment Active Problems ICD-10 Non-pressure chronic ulcer of other part of right foot with fat layer exposed Non-pressure chronic ulcer of other part of left foot with fat layer exposed Procedures Wound #1 Pre-procedure diagnosis of Wound #1 is a Pressure Ulcer located on the Right Calcaneus . There was a Selective/Open Wound Non-Viable Tissue Debridement with a total area of 0.09 sq cm performed by Fredirick Maudlin, MD. With the following instrument(s): Curette to remove Non-Viable tissue/material. Material removed includes Callus after achieving pain control using Lidocaine 4% Topical Solution. No specimens were taken. A time out was conducted at 09:57, prior to the start of the procedure. A Minimum amount of bleeding was controlled with Pressure. The procedure was tolerated well with a pain level of 0 throughout and a pain level of 0 following the procedure. Post Debridement Measurements: 0.3cm length x 0.3cm width x 0.1cm depth; 0.007cm^3 volume. Post debridement Stage noted as Category/Stage III. Character of Wound/Ulcer Post Debridement is improved. Post procedure Diagnosis Wound #1: Same as Pre-Procedure Wound #2 Pre-procedure diagnosis of Wound #2 is a Pressure Ulcer located on the Left Calcaneus . There was a T Contact Cast Procedure by Fredirick Maudlin, MD. otal Post procedure Diagnosis Wound #2: Same as Pre-Procedure Plan Follow-up Appointments: Return  Appointment in 1 week. - Dr [name] - Room 3 Wednesday 7/12 at 9:30am and Thursday 20th at 9:30am Cellular or Tissue Based Products: Cellular or Tissue Based Product Type: - Vendaje #8 (trial) ( Applied to Right and Left heels) Cellular or Tissue Based Product applied to wound bed, secured with steri-strips, cover with Adaptic or Mepitel. (DO NOT REMOVE). Bathing/ Shower/ Hygiene: May shower with protection but do not get wound dressing(s) wet. - Cover with cast protector (can purchase cast protector at [facility] or [facility] ) Edema Control - Lymphedema / SCD / Other: Elevate legs to the level of the heart or above for 30 minutes daily and/or when sitting, a frequency of: - throughout the day Avoid standing for long periods of time. Moisturize legs daily. - right leg and foot every night. Off-Loading: T Contact Cast to Left Lower Extremity otal Wedge shoe to: - Globoped Shoe to right foot and left foot when not in TCC Other: - keep pressure off of the bottom of your feet Additional Orders / Instructions: Follow Nutritious Diet WOUND #1: - Calcaneus Wound Laterality: Right Cleanser: Normal Saline (Generic) Every Other Day/30 Days Discharge Instructions: Cleanse the wound with Normal Saline prior to applying a clean dressing using gauze sponges, not tissue or cotton balls. Cleanser: Wound Cleanser (Generic) Every Other Day/30 Days Discharge Instructions: Cleanse the wound with wound cleanser prior to applying a clean dressing using gauze sponges, not tissue or cotton balls. Prim Dressing: VENDAJE Every Other Day/30 Days ary Secondary Dressing: Zetuvit Plus 4x8 in (Generic) Every Other Day/30 Days Discharge Instructions: Apply over primary dressing as directed. Secured With: Elastic Bandage 4 inch (ACE bandage) (Generic)  Every Other Day/30 Days Discharge Instructions: Secure with ACE bandage as directed. Secured With: [employer], 4.5x3.1 (in/yd) (Generic) Every Other Day/30  Days Discharge Instructions: Secure with Kerlix as directed. WOUND #2: - Calcaneus Wound Laterality: Left Cleanser: Normal Saline (Generic) 1 x Per Week/30 Days Discharge Instructions: Cleanse the wound with Normal Saline prior to applying a clean dressing using gauze sponges, not tissue or cotton balls. Cleanser: Wound Cleanser 1 x Per Week/30 Days Discharge Instructions: Cleanse the wound with wound cleanser prior to applying a clean dressing using gauze sponges, not tissue or cotton balls. Peri-Wound Care: Zinc Oxide Ointment 30g tube 1 x Per Week/30 Days Discharge Instructions: Apply Zinc Oxide to periwound with each dressing change Prim Dressing: VENDAJE 1 x Per Week/30 Days ary Secondary Dressing: Optifoam Non-Adhesive Dressing, 4x4 in (Generic) 1 x Per Week/30 Days Discharge Instructions: Apply over primary dressing as directed. Secondary Dressing: Zetuvit Plus 4x8 in (Generic) 1 x Per Week/30 Days Discharge Instructions: Apply over primary dressing as directed. Secured With: [employer], 4.5x3.1 (in/yd) (Generic) 1 x Per Week/30 Days Discharge Instructions: Secure with Kerlix as directed. Secured With: 19M Medipore H Soft Cloth Surgical T ape, 4 x 10 (in/yd) (Generic) 1 x Per Week/30 Days Discharge Instructions: Secure with tape as directed. 02/10/2022: The right calcaneus wound is nearly closed. There is still some callus that builds up around the site. The left side looks about the same in terms of dimensions, but the surface is more robust and vital-appearing. I used a curette to debride the callus away from the right calcaneal wound. I applied the trial skin substitute product (no charge, Vendaje) to both the right and left wounds. The product was secured with Adaptic and Steri-Strips. Gauze sponges were used as a bolster. T contact cast was applied on the left. Kerlix otal and Ace bandaging to the right. Follow-up in 1 week. Electronic Signature(s) Signed: 02/10/2022  10:29:56 AM By: [name] MD FACS Entered By: [name] on 02/10/2022 10:29:56 -------------------------------------------------------------------------------- HxROS Details Patient Name: Date of Service: [name], [name] 02/10/2022 9:30 A M Medical Record Number: [id number] Patient Account Number: [id number] Date of Birth/Sex: Treating RN: June 02, 1976 (46 y.o. [name] Primary Care Provider: Dorinda Hill Other Clinician: Referring Provider: Treating Provider/Extender: Jana Hakim in Treatment: 57 Information Obtained From Patient Constitutional Symptoms (General Health) Medical History: Past Medical History Notes: COVID PNA 07/22/2019-11/14/2019 [facility] ECMO, foot drop left foot , Eyes Medical History: Negative for: Cataracts; Glaucoma; Optic Neuritis Ear/Nose/Mouth/Throat Medical History: Negative for: Chronic sinus problems/congestion; Middle ear problems Hematologic/Lymphatic Medical History: Negative for: Anemia; Hemophilia; Human Immunodeficiency Virus; Lymphedema; Sickle Cell Disease Respiratory Medical History: Positive for: Asthma Negative for: Aspiration; Chronic Obstructive Pulmonary Disease (COPD); Pneumothorax; Sleep Apnea; Tuberculosis Cardiovascular Medical History: Positive for: Angina - with COVID; Hypertension Negative for: Arrhythmia; Congestive Heart Failure; Coronary Artery Disease; Deep Vein Thrombosis; Hypotension; Myocardial Infarction; Peripheral Arterial Disease; Peripheral Venous Disease; Phlebitis; Vasculitis Gastrointestinal Medical History: Negative for: Cirrhosis ; Colitis; Crohns; Hepatitis A; Hepatitis B; Hepatitis C Endocrine Medical History: Negative for: Type I Diabetes; Type II Diabetes Genitourinary Medical History: Negative for: End Stage Renal Disease Past Medical History Notes: kidney stone Immunological Medical History: Negative for: Lupus Erythematosus; Raynauds;  Scleroderma Integumentary (Skin) Medical History: Negative for: History of Burn Musculoskeletal Medical History: Negative for: Gout; Rheumatoid Arthritis; Osteoarthritis; Osteomyelitis Neurologic Medical History: Negative for: Dementia; Neuropathy; Quadriplegia; Paraplegia; Seizure Disorder Oncologic Medical History: Negative for: Received Chemotherapy; Received Radiation Psychiatric Medical History:  Negative for: Anorexia/bulimia; Confinement Anxiety Past Medical History Notes: anxiety Immunizations Pneumococcal Vaccine: Received Pneumococcal Vaccination: No Implantable Devices None Hospitalization / Surgery History Type of Hospitalization/Surgery COVID PNA 07/22/2019- 11/14/2019 03/27/2020 wound debridement/ skin graft Family and Social History Cancer: Yes - Maternal Grandparents; Diabetes: Yes - Father,Paternal Grandparents; Heart Disease: Yes - Maternal Grandparents; Hereditary Spherocytosis: No; Hypertension: Yes - Father,Paternal Grandparents; Kidney Disease: No; Lung Disease: Yes - Siblings; Seizures: No; Stroke: No; Thyroid Problems: No; Tuberculosis: No; Never smoker; Marital Status - Married; Alcohol Use: Never; Drug Use: No History; Caffeine Use: Daily - tea, soda; Financial Concerns: No; Food, Clothing or Shelter Needs: No; Support System Lacking: No; Transportation Concerns: No Electronic Signature(s) Signed: 02/10/2022 12:58:53 PM By: Fredirick Maudlin MD FACS Signed: 02/10/2022 5:29:32 PM By: [name] Entered By: Fredirick Maudlin on 02/10/2022 10:24:01 -------------------------------------------------------------------------------- Total Contact Cast Details Patient Name: Date of Service: [name], [place]. 02/10/2022 9:30 A M Medical Record Number: [id number] Patient Account Number: [id number] Date of Birth/Sex: Treating RN: 05/05/1976 (46 y.o. [name] Primary Care Provider: [name] Other Clinician: Referring Provider: Treating  Provider/Extender: [name] in Treatment: 50 T Contact Cast Applied for Wound Assessment: otal Wound #2 Left Calcaneus Performed By: Physician Fredirick Maudlin, MD Post Procedure Diagnosis Same as Pre-procedure Electronic Signature(s) Signed: 02/10/2022 12:58:53 PM By: Fredirick Maudlin MD FACS Signed: 02/10/2022 5:29:32 PM By: [name] Entered By: [name] on 02/10/2022 10:01:42 -------------------------------------------------------------------------------- SuperBill Details Patient Name: Date of Service: [name], [place]. 02/10/2022 Medical Record Number: [id number] Patient Account Number: [id number] Date of Birth/Sex: Treating RN: 30-May-1976 (46 y.o. [name] Primary Care Provider: [name] Other Clinician: Referring Provider: Treating Provider/Extender: [name] in Treatment: 48 Diagnosis Coding ICD-10 Codes Code Description [phone] Non-pressure chronic ulcer of other part of right foot with fat layer exposed L97.522 Non-pressure chronic ulcer of other part of left foot with fat layer exposed Facility Procedures CPT4 Code: [id number] Description: [phone] - DEBRIDE WOUND 1ST 20 SQ CM OR < ICD-10 Diagnosis Description L97.512 Non-pressure chronic ulcer of other part of right foot with fat layer exposed Modifier: Quantity: 1 CPT4 Code: [id number] Description: [id number] - APPLY TOTAL CONTACT LEG CAST ICD-10 Diagnosis Description L97.522 Non-pressure chronic ulcer of other part of left foot with fat layer exposed Modifier: Quantity: 1 Physician Procedures : CPT4 Code Description Modifier [id number] - WC PHYS LEVEL 3 - EST PT 25 ICD-10 Diagnosis Description L97.512 Non-pressure chronic ulcer of other part of right foot with fat layer exposed L97.522 Non-pressure chronic ulcer of other part of left foot  with fat layer exposed Quantity: 1 : [id number] - WC PHYS DEBR WO ANESTH 20 SQ CM ICD-10 Diagnosis  Description L97.512 Non-pressure chronic ulcer of other part of right foot with fat layer exposed Quantity: 1 : [id number] - WC PHYS APPLY TOTAL CONTACT CAST ICD-10 Diagnosis Description L97.522 Non-pressure chronic ulcer of other part of left foot with fat layer exposed Quantity: 1 Electronic Signature(s) Signed: 02/10/2022 10:30:30 AM By: Fredirick Maudlin MD FACS Entered By: Fredirick Maudlin on 02/10/2022 10:30:30

## 2022-02-16 ENCOUNTER — Encounter (HOSPITAL_BASED_OUTPATIENT_CLINIC_OR_DEPARTMENT_OTHER): Payer: PPO | Admitting: General Surgery

## 2022-02-16 DIAGNOSIS — L89613 Pressure ulcer of right heel, stage 3: Secondary | ICD-10-CM | POA: Diagnosis not present

## 2022-02-16 DIAGNOSIS — L89629 Pressure ulcer of left heel, unspecified stage: Secondary | ICD-10-CM | POA: Diagnosis not present

## 2022-02-16 DIAGNOSIS — L97512 Non-pressure chronic ulcer of other part of right foot with fat layer exposed: Secondary | ICD-10-CM | POA: Diagnosis not present

## 2022-02-16 DIAGNOSIS — L97412 Non-pressure chronic ulcer of right heel and midfoot with fat layer exposed: Secondary | ICD-10-CM | POA: Diagnosis not present

## 2022-02-16 DIAGNOSIS — L97422 Non-pressure chronic ulcer of left heel and midfoot with fat layer exposed: Secondary | ICD-10-CM | POA: Diagnosis not present

## 2022-02-16 NOTE — Progress Notes (Signed)
[name], [name] ([id number]) Visit Report for 02/16/2022 Arrival Information Details Patient Name: Date of Service: [name], TO [name] 02/16/2022 9:30 A M Medical Record Number: [id number] Patient Account Number: [id number] Date of Birth/Sex: Treating RN: [birth year] (46 y.o. [name] Primary Care Nyoka Alcoser: Dorinda Hill Other Clinician: Referring Christena Sunderlin: Treating Jeffery Gammell/Extender: Jana Hakim in Treatment: 46 Visit Information History Since Last Visit Added or deleted any medications: No Patient Arrived: Wheel Chair Any new allergies or adverse reactions: No Arrival Time: 09:30 Had a fall or experienced change in No Accompanied By: sonand spouse activities of daily living that may affect Transfer Assistance: None risk of falls: Patient Identification Verified: Yes Signs or symptoms of abuse/neglect since last visito No Patient Requires Transmission-Based Precautions: No Hospitalized since last visit: No Patient Has Alerts: No Implantable device outside of the clinic excluding No cellular tissue based products placed in the center since last visit: Has Dressing in Place as Prescribed: Yes Pain Present Now: No Electronic Signature(s) Signed: 02/16/2022 6:09:16 PM By: [name] RN Entered By: [name] on 02/16/2022 09:31:14 -------------------------------------------------------------------------------- Encounter Discharge Information Details Patient Name: Date of Service: [name], [name] 02/16/2022 9:30 A M Medical Record Number: [id number] Patient Account Number: [id number] Date of Birth/Sex: Treating RN: 03-16-1976 (46 y.o. [name] Primary Care Ariyan Brisendine: Dorinda Hill Other Clinician: Referring Corinda Ammon: Treating Loa Idler/Extender: Jana Hakim in Treatment: 65 Encounter Discharge Information Items Post Procedure Vitals Discharge Condition: Stable Temperature (F): 98.5 Ambulatory Status:  Wheelchair Pulse (bpm): 89 Discharge Destination: Home Respiratory Rate (breaths/min): 18 Transportation: Private Auto Blood Pressure (mmHg): 129/69 Accompanied By: self Schedule Follow-up Appointment: Yes Clinical Summary of Care: Patient Declined Electronic Signature(s) Signed: 02/16/2022 6:09:16 PM By: [name] RN Entered By: [name] on 02/16/2022 18:00:28 -------------------------------------------------------------------------------- Lower Extremity Assessment Details Patient Name: Date of Service: [name], TO [name] 02/16/2022 9:30 A M Medical Record Number: [id number] Patient Account Number: [id number] Date of Birth/Sex: Treating RN: Nov 25, 1975 (46 y.o. [name] Primary Care Taylan Marez: Dorinda Hill Other Clinician: Referring Rayquan Amrhein: Treating Jhamari Markowicz/Extender: Jana Hakim in Treatment: 36 Edema Assessment Assessed: [name]: No] Franne Forts: No] Edema: [Left: No] [Right: No] Calf Left: Right: Point of Measurement: 29 cm From Medial Instep 43 cm 43.5 cm Ankle Left: Right: Point of Measurement: 9 cm From Medial Instep 25 cm 24 cm Vascular Assessment Pulses: Dorsalis Pedis Palpable: [Left:Yes] [Right:Yes] Electronic Signature(s) Signed: 02/16/2022 6:09:16 PM By: [name] RN Entered By: [name] on 02/16/2022 09:48:10 -------------------------------------------------------------------------------- Multi Wound Chart Details Patient Name: Date of Service: [name], [name] 02/16/2022 9:30 A M Medical Record Number: [id number] Patient Account Number: [id number] Date of Birth/Sex: Treating RN: 08-20-1975 (46 y.o. [name] Primary Care Marcellous Snarski: Dorinda Hill Other Clinician: Referring Casondra Gasca: Treating Ryaan Vanwagoner/Extender: Jana Hakim in Treatment: 55 Vital Signs Height(in): 69 Pulse(bpm): 89 Weight(lbs): 280 Blood Pressure(mmHg): 129/69 Body Mass Index(BMI):  41.3 Temperature(F): 98.5 Respiratory Rate(breaths/min): 18 Photos: [1:Right Calcaneus] [2:Left Calcaneus] [N/A:N/A N/A] Wound Location: [1:Pressure Injury] [2:Pressure Injury] [N/A:N/A] Wounding Event: [1:Pressure Ulcer] [2:Pressure Ulcer] [N/A:N/A] Primary Etiology: [1:Asthma, Angina, Hypertension] [2:Asthma, Angina, Hypertension] [N/A:N/A] Comorbid History: [1:10/07/2019] [2:10/07/2019] [N/A:N/A] Date Acquired: [1:83] [2:83] [N/A:N/A] Weeks of Treatment: [1:Open] [2:Open] [N/A:N/A] Wound Status: [1:No] [2:No] [N/A:N/A] Wound Recurrence: [1:0.4x1.1x0.1] [2:2.8x0.7x0.3] [N/A:N/A] Measurements L x W x D (cm) [1:0.346] [2:1.539] [N/A:N/A] A (cm) : rea [1:0.035] [2:0.462] [N/A:N/A] Volume (cm) : [1:98.90%] [2:94.30%] [N/A:N/A] % Reduction in A rea: [1:99.90%] [2:98.30%] [N/A:N/A] % Reduction  in Volume: [1:Category/Stage III] [2:Category/Stage III] [N/A:N/A] Classification: [1:Medium] [2:Medium] [N/A:N/A] Exudate A mount: [1:Serosanguineous] [2:Serosanguineous] [N/A:N/A] Exudate Type: [1:red, brown] [2:red, brown] [N/A:N/A] Exudate Color: [1:Thickened] [2:Distinct, outline attached] [N/A:N/A] Wound Margin: [1:Small (1-33%)] [2:Large (67-100%)] [N/A:N/A] Granulation A mount: [1:Pink] [2:Red, Pink] [N/A:N/A] Granulation Quality: [1:Large (67-100%)] [2:Small (1-33%)] [N/A:N/A] Necrotic A mount: [1:Eschar, Adherent Slough] [2:Adherent Slough] [N/A:N/A] Necrotic Tissue: [1:Fat Layer (Subcutaneous Tissue): Yes Fat Layer (Subcutaneous Tissue): Yes N/A] Exposed Structures: [1:Fascia: No Tendon: No Muscle: No Joint: No Bone: No Medium (34-66%)] [2:Fascia: No Tendon: No Muscle: No Joint: No Bone: No Small (1-33%)] [N/A:N/A] Epithelialization: [1:Debridement - Excisional] [2:N/A] [N/A:N/A] Debridement: Pre-procedure Verification/Time Out 10:00 [2:N/A] [N/A:N/A] Taken: [1:Lidocaine 4% Topical Solution] [2:N/A] [N/A:N/A] Pain Control: [1:Callus, Subcutaneous, Slough] [2:N/A] [N/A:N/A] Tissue  Debrided: [1:Skin/Subcutaneous Tissue] [2:N/A] [N/A:N/A] Level: [1:0.44] [2:N/A] [N/A:N/A] Debridement A (sq cm): [1:rea Curette] [2:N/A] [N/A:N/A] Instrument: [1:Minimum] [2:N/A] [N/A:N/A] Bleeding: [1:Pressure] [2:N/A] [N/A:N/A] Hemostasis A chieved: [1:0] [2:N/A] [N/A:N/A] Procedural Pain: [1:0] [2:N/A] [N/A:N/A] Post Procedural Pain: [1:Procedure was tolerated well] [2:N/A] [N/A:N/A] Debridement Treatment Response: [1:0.4x1.1x0.1] [2:N/A] [N/A:N/A] Post Debridement Measurements L x W x D (cm) [1:0.035] [2:N/A] [N/A:N/A] Post Debridement Volume: (cm) [1:Category/Stage III] [2:N/A] [N/A:N/A] Post Debridement Stage: [1:Debridement] [2:T Contact Cast otal] [N/A:N/A] Treatment Notes Electronic Signature(s) Signed: 02/16/2022 10:17:14 AM By: [name] MD FACS Signed: 02/16/2022 6:09:16 PM By: [name] RN Entered By: [name] on 02/16/2022 10:17:14 -------------------------------------------------------------------------------- Multi-Disciplinary Care Plan Details Patient Name: Date of Service: [name], TO [name] 02/16/2022 9:30 A M Medical Record Number: [id number] Patient Account Number: [id number] Date of Birth/Sex: Treating RN: 17-Mar-1976 (46 y.o. [name] Primary Care Juwann Sherk: Dorinda Hill Other Clinician: Referring Maye Parkinson: Treating Gillie Fleites/Extender: Jana Hakim in Treatment: 37 Multidisciplinary Care Plan reviewed with physician Active Inactive Wound/Skin Impairment Nursing Diagnoses: Impaired tissue integrity Knowledge deficit related to ulceration/compromised skin integrity Goals: Patient/caregiver will verbalize understanding of skin care regimen Date Initiated: 07/15/2020 Target Resolution Date: 07/07/2022 Goal Status: Active Ulcer/skin breakdown will have a volume reduction of 30% by week 4 Date Initiated: 07/15/2020 Date Inactivated: 08/20/2020 Target Resolution Date: 09/03/2020 Goal Status: Unmet Unmet  Reason: no major changes. Ulcer/skin breakdown will heal within 14 weeks Date Initiated: 12/04/2020 Date Inactivated: 12/10/2020 Target Resolution Date: 12/10/2020 Unmet Reason: wounds still open at 14 Goal Status: Unmet weeks and today 21 weeks. Interventions: Assess patient/caregiver ability to obtain necessary supplies Assess patient/caregiver ability to perform ulcer/skin care regimen upon admission and as needed Assess ulceration(s) every visit Provide education on ulcer and skin care Treatment Activities: Skin care regimen initiated : 07/15/2020 Topical wound management initiated : 07/15/2020 Notes: Electronic Signature(s) Signed: 02/16/2022 6:09:16 PM By: [name] RN Entered By: [name] on 02/16/2022 17:58:40 -------------------------------------------------------------------------------- Pain Assessment Details Patient Name: Date of Service: [name], TO [name] 02/16/2022 9:30 A M Medical Record Number: [id number] Patient Account Number: [id number] Date of Birth/Sex: Treating RN: [birth year] (46 y.o. [name] Primary Care Leianne Callins: Dorinda Hill Other Clinician: Referring Jyden Kromer: Treating Rico Massar/Extender: Jana Hakim in Treatment: 61 Active Problems Location of Pain Severity and Description of Pain Patient Has Paino Yes Site Locations Pain Location: Pain in Ulcers With Dressing Change: No Duration of the Pain. Constant / Intermittento Intermittent Rate the pain. Current Pain Level: 0 Worst Pain Level: 4 Least Pain Level: 0 Character of Pain Describe the Pain: Tender Pain Management and Medication Current Pain Management: Medication: Yes Is the Current Pain Management Adequate: Adequate How does your wound impact your activities of daily livingo  Sleep: No Bathing: No Appetite: No Relationship With Others: No Bladder Continence: No Emotions: No Bowel Continence: No Work: No Toileting: No Drive: No Dressing:  No Hobbies: No Electronic Signature(s) Signed: 02/16/2022 6:09:16 PM By: [name] Entered By: [name] on 02/16/2022 09:36:27 -------------------------------------------------------------------------------- Patient/Caregiver Education Details Patient Name: Date of Service: [name] 7/12/2023andnbsp9:30 [place] Record Number: [id number] Patient Account Number: [id number] Date of Birth/Gender: Treating RN: 1976/01/07 (46 y.o. [name] Primary Care Physician: [name] Other Clinician: Referring Physician: Treating Physician/Extender: [name] in Treatment: 34 Education Assessment Education Provided To: Patient Education Topics Provided Wound/Skin Impairment: Methods: Explain/Verbal Responses: Return demonstration correctly Electronic Signature(s) Signed: 02/16/2022 6:09:16 PM By: [name] Entered By: [name] on 02/16/2022 17:58:56 -------------------------------------------------------------------------------- Wound Assessment Details Patient Name: Date of Service: [name], [place] 02/16/2022 9:30 A M Medical Record Number: [id number] Patient Account Number: [id number] Date of Birth/Sex: Treating RN: 09-02-1975 (46 y.o. [name] Primary Care Jailah Willis: [name] Other Clinician: Referring Jule Whitsel: Treating Jushua Waltman/Extender: [name] in Treatment: 29 Wound Status Wound Number: 1 Primary Etiology: Pressure Ulcer Wound Location: Right Calcaneus Wound Status: Open Wounding Event: Pressure Injury Comorbid History: Asthma, Angina, Hypertension Date Acquired: 10/07/2019 Weeks Of Treatment: 83 Clustered Wound: No Photos Wound Measurements Length: (cm) 0.4 Width: (cm) 1.1 Depth: (cm) 0.1 Area: (cm) 0.346 Volume: (cm) 0.035 % Reduction in Area: 98.9% % Reduction in Volume: 99.9% Epithelialization: Medium (34-66%) Tunneling: No Undermining:  No Wound Description Classification: Category/Stage III Wound Margin: Thickened Exudate Amount: Medium Exudate Type: Serosanguineous Exudate Color: red, brown Foul Odor After Cleansing: No Slough/Fibrino Yes Wound Bed Granulation Amount: Small (1-33%) Exposed Structure Granulation Quality: Pink Fascia Exposed: No Necrotic Amount: Large (67-100%) Fat Layer (Subcutaneous Tissue) Exposed: Yes Necrotic Quality: Eschar, Adherent Slough Tendon Exposed: No Muscle Exposed: No Joint Exposed: No Bone Exposed: No Treatment Notes Wound #1 (Calcaneus) Wound Laterality: Right Cleanser Normal Saline Discharge Instruction: Cleanse the wound with Normal Saline prior to applying a clean dressing using gauze sponges, not tissue or cotton balls. Wound Cleanser Discharge Instruction: Cleanse the wound with wound cleanser prior to applying a clean dressing using gauze sponges, not tissue or cotton balls. Peri-Wound Care Topical Primary Dressing VENDAJE Secondary Dressing Zetuvit Plus 4x8 in Discharge Instruction: Apply over primary dressing as directed. Secured With Elastic Bandage 4 inch (ACE bandage) Discharge Instruction: Secure with ACE bandage as directed. Kerlix Roll Sterile, 4.5x3.1 (in/yd) Discharge Instruction: Secure with Kerlix as directed. Compression Wrap Compression Stockings Add-Ons Electronic Signature(s) Signed: 02/16/2022 6:09:16 PM By: [name] Entered By: [name] on 02/16/2022 09:53:20 -------------------------------------------------------------------------------- Wound Assessment Details Patient Name: Date of Service: [name], [place] 02/16/2022 9:30 A M Medical Record Number: [id number] Patient Account Number: [id number] Date of Birth/Sex: Treating RN: 13-Feb-1976 (46 y.o. [name] Primary Care Anh Mangano: [name] Other Clinician: Referring Colyn Miron: Treating Laquanda Bick/Extender: [name] in Treatment:  10 Wound Status Wound Number: 2 Primary Etiology: Pressure Ulcer Wound Location: Left Calcaneus Wound Status: Open Wounding Event: Pressure Injury Comorbid History: Asthma, Angina, Hypertension Date Acquired: 10/07/2019 Weeks Of Treatment: 83 Clustered Wound: No Photos Wound Measurements Length: (cm) 2.8 Width: (cm) 0.7 Depth: (cm) 0.3 Area: (cm) 1.539 Volume: (cm) 0.462 % Reduction in Area: 94.3% % Reduction in Volume: 98.3% Epithelialization: Small (1-33%) Tunneling: No Undermining: No Wound Description Classification: Category/Stage III Wound Margin: Distinct, outline attached Exudate Amount: Medium Exudate Type: Serosanguineous Exudate Color:  red, brown Foul Odor After Cleansing: No Slough/Fibrino Yes Wound Bed Granulation Amount: Large (67-100%) Exposed Structure Granulation Quality: Red, Pink Fascia Exposed: No Necrotic Amount: Small (1-33%) Fat Layer (Subcutaneous Tissue) Exposed: Yes Necrotic Quality: Adherent Slough Tendon Exposed: No Muscle Exposed: No Joint Exposed: No Bone Exposed: No Treatment Notes Wound #2 (Calcaneus) Wound Laterality: Left Cleanser Normal Saline Discharge Instruction: Cleanse the wound with Normal Saline prior to applying a clean dressing using gauze sponges, not tissue or cotton balls. Wound Cleanser Discharge Instruction: Cleanse the wound with wound cleanser prior to applying a clean dressing using gauze sponges, not tissue or cotton balls. Peri-Wound Care Zinc Oxide Ointment 30g tube Discharge Instruction: Apply Zinc Oxide to periwound with each dressing change Topical Primary Dressing VENDAJE Secondary Dressing Optifoam Non-Adhesive Dressing, 4x4 in Discharge Instruction: Apply over primary dressing as directed. Zetuvit Plus 4x8 in Discharge Instruction: Apply over primary dressing as directed. Secured With [employer], 4.5x3.1 (in/yd) Discharge Instruction: Secure with Kerlix as directed. 21M Medipore H Soft  Cloth Surgical T ape, 4 x 10 (in/yd) Discharge Instruction: Secure with tape as directed. Compression Wrap Compression Stockings Add-Ons Electronic Signature(s) Signed: 02/16/2022 6:09:16 PM By: [name] RN Entered By: [name] on 02/16/2022 09:53:46 -------------------------------------------------------------------------------- Vitals Details Patient Name: Date of Service: [name], [name] 02/16/2022 9:30 A M Medical Record Number: [id number] Patient Account Number: [id number] Date of Birth/Sex: Treating RN: 1976-06-26 (46 y.o. [name] Primary Care Mica Releford: Dorinda Hill Other Clinician: Referring Wilberth Damon: Treating Aleesia Henney/Extender: Jana Hakim in Treatment: 68 Vital Signs Time Taken: 09:34 Temperature (F): 98.5 Height (in): 69 Pulse (bpm): 89 Weight (lbs): 280 Respiratory Rate (breaths/min): 18 Body Mass Index (BMI): 41.3 Blood Pressure (mmHg): 129/69 Reference Range: 80 - 120 mg / dl Notes pt does not check blood sugars at home Electronic Signature(s) Signed: 02/16/2022 6:09:16 PM By: [name] RN Entered By: [name] on 02/16/2022 09:36:44

## 2022-02-17 NOTE — Progress Notes (Signed)
[name] ([id number]) Visit Report for 02/16/2022 Chief Complaint Document Details Patient Name: Date of Service: [name], [place] 02/16/2022 9:30 A M Medical Record Number: [id number] Patient Account Number: [id number] Date of Birth/Sex: Treating RN: Sep 15, 1975 (46 y.o. [name] Primary Care Provider: [name] Other Clinician: Referring Provider: Treating Provider/Extender: [name] in Treatment: 72 Information Obtained from: Patient Chief Complaint Bilateral Plantar Foot Ulcers Electronic Signature(s) Signed: 02/16/2022 10:17:23 AM By: Fredirick Maudlin MD FACS Entered By: Fredirick Maudlin on 02/16/2022 10:17:23 -------------------------------------------------------------------------------- Cellular or Tissue Based Product Details Patient Name: Date of Service: [name], [name] 02/16/2022 9:30 A M Medical Record Number: [id number] Patient Account Number: [id number] Date of Birth/Sex: Treating RN: September 11, 1975 (46 y.o. [name] Primary Care Provider: [name] Other Clinician: Referring Provider: Treating Provider/Extender: [name] in Treatment: 6 Cellular or Tissue Based Product Type Wound #1 Right Calcaneus Applied to: Performed By: Physician Fredirick Maudlin, MD Cellular or Tissue Based Product Type: Other Level of Consciousness (Pre-procedure): Awake and Alert Pre-procedure Verification/Time Out Yes - 10:00 Taken: Location: genitalia / hands / feet / multiple digits Wound Size (sq cm): 0.44 Product Size (sq cm): 4 Waste Size (sq cm): 0 Amount of Product Applied (sq cm): 4 Instrument Used: Forceps, Scissors Lot #: [id number] Order #: 9 Expiration Date: 12/15/2024 Fenestrated: No Reconstituted: No Secured: Yes Secured With: Steri-Strips Dressing Applied: Yes Primary Dressing: Adaptic;gauze Procedural Pain: 0 Post Procedural Pain: 0 Response to Treatment: Procedure was  tolerated well Level of Consciousness (Post- Awake and Alert procedure): Post Procedure Diagnosis Same as Pre-procedure Notes ****TRIAL***** VENDAJE Electronic Signature(s) Signed: 02/16/2022 6:09:16 PM By: [name] Signed: 02/17/2022 7:33:58 AM By: Fredirick Maudlin MD FACS Entered By: [name] on 02/16/2022 17:58:21 -------------------------------------------------------------------------------- Cellular or Tissue Based Product Details Patient Name: Date of Service: [name], [place] 02/16/2022 9:30 A M Medical Record Number: [id number] Patient Account Number: [id number] Date of Birth/Sex: Treating RN: [birth year] (46 y.o. [name] Primary Care Provider: [name] Other Clinician: Referring Provider: Treating Provider/Extender: [name] in Treatment: 69 Cellular or Tissue Based Product Type Wound #2 Left Calcaneus Applied to: Performed By: Physician Fredirick Maudlin, MD Cellular or Tissue Based Product Type: Other Level of Consciousness (Pre-procedure): Awake and Alert Pre-procedure Verification/Time Out Yes - 10:00 Taken: Location: genitalia / hands / feet / multiple digits Wound Size (sq cm): 1.96 Product Size (sq cm): 16 Waste Size (sq cm): 0 Amount of Product Applied (sq cm): 16 Instrument Used: Forceps, Scissors Lot #: [id number] Order #: 9 Expiration Date: 12/15/2024 Fenestrated: No Reconstituted: No Secured: Yes Secured With: Steri-Strips Dressing Applied: Yes Primary Dressing: Adaptic;gauze Procedural Pain: 0 Post Procedural Pain: 0 Response to Treatment: Procedure was tolerated well Level of Consciousness (Post- Awake and Alert procedure): Post Procedure Diagnosis Same as Pre-procedure Notes Vendaje *** TRIAL***** Electronic Signature(s) Signed: 02/16/2022 6:09:16 PM By: [name] Signed: 02/17/2022 7:33:58 AM By: Fredirick Maudlin MD FACS Entered By: [name] on 02/16/2022  18:07:32 -------------------------------------------------------------------------------- Debridement Details Patient Name: Date of Service: [name], [place] 02/16/2022 9:30 A M Medical Record Number: [id number] Patient Account Number: [id number] Date of Birth/Sex: Treating RN: 11-23-1975 (46 y.o. [name] Primary Care Provider: [name] Other Clinician: Referring Provider: Treating Provider/Extender: [name] in Treatment: 83 Debridement Performed for Assessment: Wound #1 Right Calcaneus Performed By: Physician Fredirick Maudlin, MD Debridement Type: Debridement Level of Consciousness (Pre-procedure): Awake  and Alert Pre-procedure Verification/Time Out Yes - 10:00 Taken: Start Time: 10:00 Pain Control: Lidocaine 4% T opical Solution T Area Debrided (L x W): otal 0.4 (cm) x 1.1 (cm) = 0.44 (cm) Tissue and other material debrided: Non-Viable, Callus, Slough, Subcutaneous, Slough Level: Skin/Subcutaneous Tissue Debridement Description: Excisional Instrument: Curette Bleeding: Minimum Hemostasis Achieved: Pressure End Time: 10:02 Procedural Pain: 0 Post Procedural Pain: 0 Response to Treatment: Procedure was tolerated well Level of Consciousness (Post- Awake and Alert procedure): Post Debridement Measurements of Total Wound Length: (cm) 0.4 Stage: Category/Stage III Width: (cm) 1.1 Depth: (cm) 0.1 Volume: (cm) 0.035 Character of Wound/Ulcer Post Debridement: Improved Post Procedure Diagnosis Same as Pre-procedure Electronic Signature(s) Signed: 02/16/2022 12:27:51 PM By: Fredirick Maudlin MD FACS Signed: 02/16/2022 6:09:16 PM By: [name] Entered By: [name] on 02/16/2022 10:12:07 -------------------------------------------------------------------------------- HPI Details Patient Name: Date of Service: [name], [place] E. 02/16/2022 9:30 A M Medical Record Number: [id number] Patient Account Number: [id number] Date of  Birth/Sex: Treating RN: [birth year] (46 y.o. [name] Primary Care Provider: [name] Other Clinician: Referring Provider: Treating Provider/Extender: [name] in Treatment: 70 History of Present Illness HPI Description: Wounds are12/03/2020 upon evaluation today patient presents for initial inspection here in our clinic concerning issues he has been having with the bottoms of his feet bilaterally. He states these actually occurred as wounds when he was hospitalized for 5 months secondary to Covid. He was apparently with tilting bed where he was in an upright position quite frequently and apparently this occurred in some way shape or form during that time. Fortunately there is no sign of active infection at this time. No fevers, chills, nausea, vomiting, or diarrhea. With that being said he still has substantial wounds on the plantar aspects of his feet Theragen require quite a bit of work to get these to heal. He has been using Santyl currently though that is been problematic both in receiving the medication as well as actually paid for it as it is become quite expensive. Prior to the experience with Covid the patient really did not have any major medical problems other than hypertension he does have some mild generalized weakness following the Covid experience. 07/22/2020 on evaluation today patient appears to be doing okay in regard to his foot ulcers I feel like the wound beds are showing signs of better improvement that I do believe the Iodoflex is helping in this regard. With that being said he does have a lot of drainage currently and this is somewhat blue/green in nature which is consistent with Pseudomonas. I do think a culture today would be appropriate for [place] to evaluate and see if that is indeed the case I would likely start him on antibiotic orally as well he is not allergic to Cipro knows of no issues he has had in the past 12/21; patient was  admitted to the clinic earlier this month with bilateral presumed pressure ulcers on the bottom of his feet apparently related to excessive pressure from a tilt table arrangement in the intensive care unit. Patient relates this to being on ECMO but I am not really sure that is exactly related to that. I must say I have never seen anything like this. He has fairly extensive full-thickness wounds extending from his heel towards his midfoot mostly centered laterally. There is already been some healing distally. He does not appear to have an arterial issue. He has been using gentamicin to the wound surfaces with Iodoflex to  help with ongoing debridement 1/6; this is a patient with pressure ulcers on the bottom of his feet related to excessive pressure from a standing position in the intensive care unit. He is complaining of a lot of pain in the right heel. He is not a diabetic. 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 We have been using Iodoflex to help prepare the surfaces of both wounds for an advanced treatment product. He is nonambulatory spending most of his time in a wheelchair I have asked him not to propel the wheelchair with his heels 1/13; in general his wounds look better not much surface area change we have been using Iodoflex as of last week. I did an x-ray of the right heel as the patient was complaining of pain. I had some thoughts about a stress fracture perhaps Achilles tendon problems however what it showed was erosive changes along the inferior aspect of the calcaneus he now has a MRI booked for 1/20. 1/20; in general his wounds continue to be better. Some improvement in the large narrow areas proximally in his foot. He is still complaining of pain in the right heel and tenderness in certain areas of this wound. His MRI is tonight. I am not just looking for osteomyelitis that was brought up on the x-ray I am wondering about stress fractures, tendon ruptures etc.  He has no such findings on the left. Also noteworthy is that the patient had critical illness neuropathy and some of the discomfort may be actual improvement in nerve function I am just not sure. These wounds were initially in the setting of severe critical illness related to COVID-19. He was put in a standing position. He may have also been on pressors at the point contributing to tissue ischemia. By his description at some point these wounds were grossly necrotic extending proximally up into the Achilles part of his heel. I do not know that I have ever really seen pictures of them like this although they may exist in epic We have ordered Tri layer Oasis. I am trying to stimulate some granulation in these areas. This is of course assuming the MRI is negative for infection 1/27; since the patient was last here he saw Dr. Juleen China of infectious disease. He is planned for vancomycin and ceftriaxone. Prior operative culture grew MSSA. Also ordered baseline lab work. He also ordered arterial studies although the ABIs in our clinic were normal as well as his clinical exam these were normal I do not think he needs to see vascular surgery. His ABIs at the [facility] were 1.22 in the right triphasic waveforms with a normal TBI of 1.15 on the left ABI of 1.22 with triphasic waveforms and a normal TBI of 1.08. Finally he saw Dr. Amalia Hailey who will follow him in for 2 months. At this point I do not think he felt that he needed a procedure on the right calcaneal bone. Dr. Juleen China is elected for broad-spectrum antibiotic The patient is still having pain in the right heel. He walks with a walker 2/3; wounds are generally smaller. He is tolerating his IV antibiotics. I believe this is vancomycin and ceftriaxone. We are still waiting for Oasis burn in terms of his out-of-pocket max which he should be meeting soon given the IV antibiotics, MRIs etc. I have asked him to check in on this. We are using silver collagen in the  meantime the wounds look better 2/10; tolerating IV vancomycin and Rocephin. We are waiting to apply  for Oasis. Although I am not really sure where he is in his out-of-pocket max. 2/17 started the first application of Oasis trilayer. Still on antibiotics. The wounds have generally look better. The area on the left has a little more surface slough requiring debridement 2/24; second application of Oasis trilayer. The wound surface granulation is generally look better. The area on the left with undermining laterally I think is come in a bit. 10/08/2020 upon evaluation today patient is here today for [employer] application #3. Fortunately he seems to be doing extremely well with regard to this and we are seeing a lot of new epithelial growth which is great news. Fortunately there is no signs of active infection at this time. 10/16/2020 upon evaluation today patient appears to be doing well with regard to his foot ulcers. Do believe the Oasis has been of benefit for him. I do not see any signs of infection right now which is great news and I think that he has a lot of new epithelial growth which is great to see as well. The patient is very pleased to hear all of this. I do think we can proceed with the Oasis trilayer #4 today. 3/18; not as much improvement in these areas on his heels that I was hoping. I did reapply trilateral Oasis today the tissue looks healthier but not as much fill in as I was hoping. 3/25; better looking today I think this is come in a bit the tissue looks healthier. Triple layer Oasis reapplied #6 4/1; somewhat better looking definitely better looking surface not as much change in surface area as I was hoping. He may be spending more time [place] on days then he needs to although he does have heel offloading boots. Triple layer Oasis reapplied #7 4/7; unfortunately apparently [facility] will not approve any further Oasis which is unfortunate since the patient did respond  nicely both in terms of the condition of the wound bed as well as surface area. There is still some drainage coming from the wound but not a lot there does not appear to be any infection 4/15; we have been using Hydrofera Blue. He continues to have nice rims of epithelialization on the right greater than the left. The left the epithelialization is coming from the tip of his heel. There is moderate drainage. In this that concerns me about a total contact cast. There is no evidence of infection 4/29; patient has been using Hydrofera Blue with dressing changes. He has no complaints or issues today. 5/5; using Hydrofera Blue. I actually think that he looks marginally better than the last time I saw this 3 weeks ago. There are rims of epithelialization on the left thumb coming from the medial side on the right. Using Hydrofera Blue 5/12; using Hydrofera Blue. These continue to make improvements in surface area. His drainage was not listed as severe I therefore went ahead and put a cast on the left foot. Right foot we will continue to dress his previous 5/16; back for first total contact cast change. He did not tolerate this particularly well cast injury on the anterior tibia among other issues. Difficulty sleeping. I talked him about this in some detail and afterwards is elected to continue. I told him I would like to have a cast on for 3 weeks to see if this is going to help at all. I think he agreed 5/19; I think the wound is better. There is no tunneling towards his midfoot. The  undermining medially also looks better. He has a rim of new skin distally. I think we are making progress here. The area on the left also continues to look somewhat better to me using Hydrofera Blue. He has a list of complaints about the cast but none of them seem serious 5/26; patient presents for 1 week follow-up. He has been using a total contact cast and tolerating this well. Hydrofera Blue is the main dressing used.  He denies signs of infection. 6/2 Hydrofera Blue total contact cast on the left. These were large ulcers that formed in intensive care unit where the patient was recovering from [place]. May have had something to do with being ventilated in an upright positiono Pressors etc. We have been able to get the areas down considerably and a viable surface. There is some epithelialization in both sides. Note made of drainage 6/9; changed to [name] last time because of drainage. He arrives with better looking surfaces and dimensions on the left than the right. Paradoxically the right actually probes more towards his midfoot the left is largely close down but both of these look improved. Using a total contact cast on the left 6/16; complex wounds on his bilateral plantar heels which were initially pressure injury from a stay in the ICU with COVID. We have been using silver alginate most recently. His dimensions of come in quite dramatically however not recently. We have been putting the left foot in a total contact cast 6/23; complex wounds on the bilateral plantar heels. I been putting the left in the cast paradoxically the area on the right is the one that is going towards closure at a faster rate. Quite a bit of drainage on the left. The patient went to see Dr. Amalia Hailey who said he was going to standby for skin grafts. I had actually considered sending him for skin grafts however he would be mandatorily off his feet for a period of weeks to months. I am thinking that the area on the right is going to close on its own the area on the left has been more stubborn even though we have him in a total contact cast 6/30; took him out of a total contact cast last week is the right heel seem to be making better progress than the left where I was placing the cast. We are using silver alginate. Both wounds are smaller right greater than left 7/12; both wounds look as though they are making some progress. We are using  silver alginate. Heel offloading boots 7/26; very gradual progress especially on the right. Using silver alginate. He is wearing heel offloading boots 8/18; he continues to close these wounds down very gradually. Using silver alginate. The problem polymen being definitive about this is areas of what appears to be callus around the margins. This is not a 100% of the area but certainly sizable especially on the right 9/1; bilateral plantar feet wounds secondary to prolonged pressure while being ventilated for COVID-19 in an upright position. Essentially pressure ulcers on the bottom of his feet. He is made substantial progress using silver alginate. 9/14; bilateral plantar feet wounds secondary to prolonged pressure. Making progress using silver alginate. 9/29 bilateral plantar feet wounds secondary to prolonged pressure. I changed him to Iodoflex last week. MolecuLight showing reddened blush fluorescence 10/11; patient presents for follow-up. He has no issues or complaints today. He denies signs of infection. He continues to use Iodoflex and antibiotic ointment to the wound beds. 10/27; 2-week follow-up. No  evidence of infection. He has callus and thick dry skin around the wound margins we have been using Iodoflex and Bactroban which was in response to a moderate left MolecuLight reddish blush fluorescence. 11/10; 2-week follow-up. Wound margins again have thick callus however the measurements of the actual wound sites are a lot smaller. Everything looks reasonably healthy here. We have been using Iodoflex He was approved for prime matrix but I have elected to delay this given the improvement in the surface area. Hopefully I will not regret that decision as were getting close to the end of the year in terms of insurance payment 12/8; 2-week follow-up. Wounds are generally smaller in size. These were initially substantial wounds extending into the forefoot all the way into the heel on the bilateral  plantar feet. They are now both located on the plantar heel distal aspect both of these have a lot of callus around the wounds I used a #5 curette to remove this on the right and the left also some subcutaneous debris to try and get the wound edges were using Iodoflex. He has heel offloading shoe 12/22; 2-week follow-up. Not really much improvement. He has thick callus around the outer edges of both wounds. I remove this there is some nonviable subcutaneous tissue as well. We have been using Iodoflex. Her intake nurse and myself spontaneously thought of a total contact cast I went back in May. At that time we really were not seeing much of an improvement with a cast although the wound was in a much different situation I would like to retry this in 2 weeks and I discussed this with the patient 08/12/2021; the patient has had some improvement with the Iodoflex. The the area on the left heel plantar more improved than the right. I had to put him in a total contact cast on the left although I decided to put that off for 2 weeks. I am going to change his primary dressing to silver collagen. I think in both areas he has had some improvement most of the healing seems to be more proximal in the heel. The wounds are in the mid aspect. A lot of thick callus on the right heel however. 1/19; we are using silver collagen on both plantar heel areas. He has had some improvement today. The left did not require any debridement. He still had some eschar on the right that was debrided but both seem to have contracted. I did not put it total contact cast on him today 2/2 we have been using silver collagen. The area on the right plantar heel has areas that appear to be epithelialized interspersed with dry flaking callus and dry skin. I removed this. This really looks better than on the other side. On the left still a large area with raised edges and debris on the surface. The patient states he is in the heel offloading boots  for a prolonged period of time and really does not use any other footwear 2/6; patient presents for first cast exchange. He has no issues or complaints today. 2/9; not much change in the left foot wound with 1 week of a cast we are using silver collagen. Silver collagen on the right side. The right side has been the better wound surface. We will reapply the total contact cast on the left 2/16; not much improvement on either side I been using silver collagen with a total contact cast on the left. I'm changing the Hydrofera Blue still with a total contact  cast on the left 2/23; some improvement on both sides. Disappointing that he has thick callus around the area that we are putting in a total contact cast on the left. We've been using Hydrofera Blue on both wound areas. This is a man who at essentially pressure ulcers in addition to ischemia caused by medications to support his blood pressure (pressors) in the ICU. He was being ventilated in the standing position for severe Covid. A [place] the wounds extended across his entire foot but are now localized to his plantar heels bilaterally. We have made progress however neither areas healed. I continue to think the total contact cast is helped albeit painstakingly slowly. He has never wanted a plastic surgery consult although I don't know that they would be interested in grafting in area in this location. 10/07/2021: Continued improvement bilaterally. There is still some callus around the left wound, despite the total contact cast. He has some increased pain in his right midfoot around 1 particular area. This has been painful in the past but seems to be a little bit worse. When his cast was removed today, there was an area on the heel of the left foot that looks a bit macerated. He is also complaining of pain in his left thigh and hip which he thinks is secondary to the limb length discrepancy caused by the cast. 10/14/2021: He continues to improve. A little  bit less callus around the left wound. He continues to endorse pain in his right midfoot, but this is not as significant as it was last week. The maceration on his left heel is improved. 10/21/2021: Continued improvement to both wounds. The maceration on his left heel is no longer evident. Less callus bilaterally. Epithelialization progressing. 10/28/2021: Significant improvement this week. The right sided wound is nearly closed with just a small open area at the middle. No maceration seen on the left heel. Continued epithelialization on both sides. No concern for infection. 11/04/2021: T oday, the wounds were measured a little bit differently and come out as larger, but I actually think they are about the same to potentially even smaller, particularly on the left. He continues to accumulate some callus on the right. 11/11/2021: T oday, the patient is expressing some concern that the left wound, despite being in the total contact cast, is not progressing at the same rate as the 1 on the right. He is interested in trying a week without the cast to see how the wound does. The wounds are roughly the same size as last week, with the right perhaps being a little bit smaller. He continues to build up callus on both sites. 11/18/2021: Last week, I permitted the patient to go without his total contact cast, just to see if the cast was really making any difference. Today, both wounds have deteriorated to some extent, suggesting that the cast is providing benefit, at least on the left. Both are larger and have accumulated callus, slough, and other debris. 11/26/2021: I debrided both wounds quite aggressively last week in an effort to stimulate the healing cascade. This appears to have been effective as the left sided wound is a full centimeter shorter in length. Although the right was measured slightly larger, on inspection, it looks as though an area of epithelialized tissue was included in the measurements. We have  been using PolyMem Ag on the wound surfaces with a total contact cast to the left. 12/02/2021: It appears that the intake personnel are including epithelialized tissue in his wound  measurements; the right wound is almost completely epithelialized; there is just a crater at the proximal midfoot with some open areas. On the left, he has built up some callus, but the overall wound surface looks good. There is some senescent skin around the wound margin. He has been in PolyMem Ag bilaterally with a total contact cast on the left. 12/09/2021: The right wound is nearly closed; there is just a small open area at the mid calcaneus. On the left, the wound is smaller with minimal callus buildup. No significant drainage. 12/16/2021: The right calcaneal wound remains minimally open at the mid calcaneus; the rest has epithelialized. On the left, the wound is also a little bit smaller. There is some senescent tissue on the periphery. He is getting his first application of a trial skin substitute called Vendaje today. 12/23/2021: The wound on his right calcaneus is nearly closed; there is just a small area at the most distal aspect of the calcaneus that is open. On the left, the area where we applied to the skin substitute has a healthier-looking bed of granulation tissue. The wound dimensions are not significantly different on this side but the wound surface is improved. 12/30/2021: The wound on the right calcaneus has not changed significantly aside from some accumulation of callus. On the left, the open area is smaller and continues to have an improved surface. He continues to accumulate callus around the wound. He is here for his third application of Vendaje. 01/06/2022: The right calcaneal wound is down to just a couple of millimeters. He continues to accumulate periwound callus. He unfortunately got his cast wet earlier in the week and his left foot is macerated, resulting in some superficial skin loss just distal to  the open wound. The open wound itself, however, is much smaller and has a healthier appearing surface. He is here for his fourth application of Vendaje. 01/13/2022: The right calcaneal wound is about the same. Unfortunately, once again, his cast got wet and his foot is again macerated. This is caused the left calcaneal wound to enlarge. He is here for his fifth application of Vendaje. 01/20/2022: The right calcaneal wound is very small. There is some periwound callus accumulation. He purchased a new cast protector last week and this has been effective in avoiding the maceration that has been occurring on the left. The left calcaneal wound is narrower and has a healthy and viable-appearing surface. He is here for his 6 application of Vendaje. 01/27/2022: The right calcaneal wound is down to just a pinhole. There is some periwound slough and callus. On the left, the wound is narrower and shorter by about a centimeter. The surface is robust and viable-appearing. Unfortunately, the rep for the trial skin substitute product did not provide any for [place] to use today. 02/04/2022: The right calcaneal wound remains unchanged. There is more accumulated callus. On the left, although the intake nurse measured it a little bit longer, it looks about the same to me. The surface has a layer of slough, but underneath this, there is good granulation tissue. 02/10/2022: The right calcaneus wound is nearly closed. There is still some callus that builds up around the site. The left side looks about the same in terms of dimensions, but the surface is more robust and vital-appearing. 02/16/2022: The area of the right calcaneus that was nearly closed last week has closed, but there is a small opening at the mid foot where it looks like some moisture got retained and caused some  reopening. The left foot wound is narrower and shallower. Both sites have a fair amount of periwound callus and eschar. Electronic Signature(s) Signed:  02/16/2022 10:18:57 AM By: Fredirick Maudlin MD FACS Entered By: Fredirick Maudlin on 02/16/2022 10:18:57 -------------------------------------------------------------------------------- Physical Exam Details Patient Name: Date of Service: [name], [place] 02/16/2022 9:30 A M Medical Record Number: [id number] Patient Account Number: [id number] Date of Birth/Sex: Treating RN: 1975-11-22 (46 y.o. [name] Primary Care Provider: [name] Other Clinician: Referring Provider: Treating Provider/Extender: [name] in Treatment: 64 Constitutional . . . . No acute distress.Marland Kitchen Respiratory Normal work of breathing on room air.. Notes 02/16/2022: The area of the right calcaneus that was nearly closed last week has closed, but there is a small opening at the mid foot where it looks like some moisture got retained and caused some reopening. The left foot wound is narrower and shallower. Both sites have a fair amount of periwound callus and eschar. Electronic Signature(s) Signed: 02/16/2022 10:31:25 AM By: Fredirick Maudlin MD FACS Entered By: Fredirick Maudlin on 02/16/2022 10:31:25 -------------------------------------------------------------------------------- Physician Orders Details Patient Name: Date of Service: [name], [place] 02/16/2022 9:30 A M Medical Record Number: [id number] Patient Account Number: [id number] Date of Birth/Sex: Treating RN: [birth year] (46 y.o. [name] Primary Care Provider: [name] Other Clinician: Referring Provider: Treating Provider/Extender: [name] in Treatment: 7 Verbal / Phone Orders: No Diagnosis Coding ICD-10 Coding Code Description L97.512 Non-pressure chronic ulcer of other part of right foot with fat layer exposed L97.522 Non-pressure chronic ulcer of other part of left foot with fat layer exposed Follow-up Appointments ppointment in 1 week. - Dr [name] - Room 3 Thursday July  20th at 9:30am and Thursday July 27th at 9:30am Return A Cellular or Tissue Based Products Cellular or Tissue Based Product Type: - Vendaje #9 (trial) ( Applied to Right and Left heels) Cellular or Tissue Based Product applied to wound bed, secured with steri-strips, cover with A daptic or Mepitel. (DO NOT REMOVE). Bathing/ Shower/ Hygiene May shower with protection but do not get wound dressing(s) wet. - Cover with cast protector (can purchase cast protector at [facility] or [facility] ) Edema Control - Lymphedema / SCD / Other Bilateral Lower Extremities Elevate legs to the level of the heart or above for 30 minutes daily and/or when sitting, a frequency of: - throughout the day Avoid standing for long periods of time. Moisturize legs daily. - right leg and foot every night. Off-Loading Total Contact Cast to Left Lower Extremity Wedge shoe to: - Globoped Shoe to right foot and left foot when not in TCC Other: - keep pressure off of the bottom of your feet Additional Orders / Instructions Follow Nutritious Diet Wound Treatment Wound #1 - Calcaneus Wound Laterality: Right Cleanser: Normal Saline (Generic) Every Other Day/30 Days Discharge Instructions: Cleanse the wound with Normal Saline prior to applying a clean dressing using gauze sponges, not tissue or cotton balls. Cleanser: Wound Cleanser (Generic) Every Other Day/30 Days Discharge Instructions: Cleanse the wound with wound cleanser prior to applying a clean dressing using gauze sponges, not tissue or cotton balls. Prim Dressing: VENDAJE ary Every Other Day/30 Days Secondary Dressing: Zetuvit Plus 4x8 in (Generic) Every Other Day/30 Days Discharge Instructions: Apply over primary dressing as directed. Secured With: Elastic Bandage 4 inch (ACE bandage) (Generic) Every Other Day/30 Days Discharge Instructions: Secure with ACE bandage as directed. Secured With: [employer], 4.5x3.1 (in/yd) (Generic) Every Other Day/30  Days Discharge Instructions: Secure with Kerlix as directed. Wound #2 - Calcaneus Wound Laterality: Left Cleanser: Normal Saline (Generic) 1 x Per Week/30 Days Discharge Instructions: Cleanse the wound with Normal Saline prior to applying a clean dressing using gauze sponges, not tissue or cotton balls. Cleanser: Wound Cleanser 1 x Per Week/30 Days Discharge Instructions: Cleanse the wound with wound cleanser prior to applying a clean dressing using gauze sponges, not tissue or cotton balls. Peri-Wound Care: Zinc Oxide Ointment 30g tube 1 x Per Week/30 Days Discharge Instructions: Apply Zinc Oxide to periwound with each dressing change Prim Dressing: VENDAJE ary 1 x Per Week/30 Days Secondary Dressing: Optifoam Non-Adhesive Dressing, 4x4 in (Generic) 1 x Per Week/30 Days Discharge Instructions: Apply over primary dressing as directed. Secondary Dressing: Zetuvit Plus 4x8 in (Generic) 1 x Per Week/30 Days Discharge Instructions: Apply over primary dressing as directed. Secured With: [employer], 4.5x3.1 (in/yd) (Generic) 1 x Per Week/30 Days Discharge Instructions: Secure with Kerlix as directed. Secured With: 75M Medipore H Soft Cloth Surgical T ape, 4 x 10 (in/yd) (Generic) 1 x Per Week/30 Days Discharge Instructions: Secure with tape as directed. Electronic Signature(s) Signed: 02/16/2022 12:27:51 PM By: Fredirick Maudlin MD FACS Signed: 02/16/2022 6:09:16 PM By: [name] Entered By: [name] on 02/16/2022 10:49:29 -------------------------------------------------------------------------------- Problem List Details Patient Name: Date of Service: [name], [place] 02/16/2022 9:30 A M Medical Record Number: [id number] Patient Account Number: [id number] Date of Birth/Sex: Treating RN: 22-Aug-1975 (46 y.o. [name] Primary Care Provider: [name] Other Clinician: Referring Provider: Treating Provider/Extender: [name] in  Treatment: 38 Active Problems ICD-10 Encounter Code Description Active Date MDM Diagnosis L97.512 Non-pressure chronic ulcer of other part of right foot with fat layer exposed 09/03/2020 No Yes L97.522 Non-pressure chronic ulcer of other part of left foot with fat layer exposed 09/03/2020 No Yes Inactive Problems ICD-10 Code Description Active Date Inactive Date L89.893 Pressure ulcer of other site, stage 3 07/15/2020 07/15/2020 M62.81 Muscle weakness (generalized) 07/15/2020 07/15/2020 I10 Essential (primary) hypertension 07/15/2020 07/15/2020 M86.171 Other acute osteomyelitis, right ankle and foot 09/03/2020 09/03/2020 Resolved Problems Electronic Signature(s) Signed: 02/16/2022 10:17:03 AM By: Fredirick Maudlin MD FACS Entered By: Fredirick Maudlin on 02/16/2022 10:17:03 -------------------------------------------------------------------------------- Progress Note Details Patient Name: Date of Service: [name], [place]. 02/16/2022 9:30 A M Medical Record Number: [id number] Patient Account Number: [id number] Date of Birth/Sex: Treating RN: Sep 01, 1975 (46 y.o. [name] Primary Care Provider: Other Clinician: [name] Referring Provider: Treating Provider/Extender: [name] in Treatment: 59 Subjective Chief Complaint Information obtained from Patient Bilateral Plantar Foot Ulcers History of Present Illness (HPI) Wounds are12/03/2020 upon evaluation today patient presents for initial inspection here in our clinic concerning issues he has been having with the bottoms of his feet bilaterally. He states these actually occurred as wounds when he was hospitalized for 5 months secondary to Covid. He was apparently with tilting bed where he was in an upright position quite frequently and apparently this occurred in some way shape or form during that time. Fortunately there is no sign of active infection at this time. No fevers, chills, nausea, vomiting, or  diarrhea. With that being said he still has substantial wounds on the plantar aspects of his feet Theragen require quite a bit of work to get these to heal. He has been using Santyl currently though that is been problematic both in receiving the medication as well as actually paid for it as it  is become quite expensive. Prior to the experience with Covid the patient really did not have any major medical problems other than hypertension he does have some mild generalized weakness following the Covid experience. 07/22/2020 on evaluation today patient appears to be doing okay in regard to his foot ulcers I feel like the wound beds are showing signs of better improvement that I do believe the Iodoflex is helping in this regard. With that being said he does have a lot of drainage currently and this is somewhat blue/green in nature which is consistent with Pseudomonas. I do think a culture today would be appropriate for [place] to evaluate and see if that is indeed the case I would likely start him on antibiotic orally as well he is not allergic to Cipro knows of no issues he has had in the past 12/21; patient was admitted to the clinic earlier this month with bilateral presumed pressure ulcers on the bottom of his feet apparently related to excessive pressure from a tilt table arrangement in the intensive care unit. Patient relates this to being on ECMO but I am not really sure that is exactly related to that. I must say I have never seen anything like this. He has fairly extensive full-thickness wounds extending from his heel towards his midfoot mostly centered laterally. There is already been some healing distally. He does not appear to have an arterial issue. He has been using gentamicin to the wound surfaces with Iodoflex to help with ongoing debridement 1/6; this is a patient with pressure ulcers on the bottom of his feet related to excessive pressure from a standing position in the intensive care unit. He  is complaining of a lot of pain in the right heel. He is not a diabetic. 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 We have been using Iodoflex to help prepare the surfaces of both wounds for an advanced treatment product. He is nonambulatory spending most of his time in a wheelchair I have asked him not to propel the wheelchair with his heels 1/13; in general his wounds look better not much surface area change we have been using Iodoflex as of last week. I did an x-ray of the right heel as the patient was complaining of pain. I had some thoughts about a stress fracture perhaps Achilles tendon problems however what it showed was erosive changes along the inferior aspect of the calcaneus he now has a MRI booked for 1/20. 1/20; in general his wounds continue to be better. Some improvement in the large narrow areas proximally in his foot. He is still complaining of pain in the right heel and tenderness in certain areas of this wound. His MRI is tonight. I am not just looking for osteomyelitis that was brought up on the x-ray I am wondering about stress fractures, tendon ruptures etc. He has no such findings on the left. Also noteworthy is that the patient had critical illness neuropathy and some of the discomfort may be actual improvement in nerve function I am just not sure. These wounds were initially in the setting of severe critical illness related to COVID-19. He was put in a standing position. He may have also been on pressors at the point contributing to tissue ischemia. By his description at some point these wounds were grossly necrotic extending proximally up into the Achilles part of his heel. I do not know that I have ever really seen pictures of them like this although they may exist in epic  We have ordered Tri layer Oasis. I am trying to stimulate some granulation in these areas. This is of course assuming the MRI is negative for infection 1/27; since the patient was  last here he saw Dr. Juleen China of infectious disease. He is planned for vancomycin and ceftriaxone. Prior operative culture grew MSSA. Also ordered baseline lab work. He also ordered arterial studies although the ABIs in our clinic were normal as well as his clinical exam these were normal I do not think he needs to see vascular surgery. His ABIs at the PTA were 1.22 in the right triphasic waveforms with a normal TBI of 1.15 on the left ABI of 1.22 with triphasic waveforms and a normal TBI of 1.08. Finally he saw Dr. Amalia Hailey who will follow him in for 2 months. At this point I do not think he felt that he needed a procedure on the right calcaneal bone. Dr. Juleen China is elected for broad-spectrum antibiotic The patient is still having pain in the right heel. He walks with a walker 2/3; wounds are generally smaller. He is tolerating his IV antibiotics. I believe this is vancomycin and ceftriaxone. We are still waiting for Oasis burn in terms of his out-of-pocket max which he should be meeting soon given the IV antibiotics, MRIs etc. I have asked him to check in on this. We are using silver collagen in the meantime the wounds look better 2/10; tolerating IV vancomycin and Rocephin. We are waiting to apply for Oasis. Although I am not really sure where he is in his out-of-pocket max. 2/17 started the first application of Oasis trilayer. Still on antibiotics. The wounds have generally look better. The area on the left has a little more surface slough requiring debridement [id number]; second application of Oasis trilayer. The wound surface granulation is generally look better. The area on the left with undermining laterally I think is come in a bit. 10/08/2020 upon evaluation today patient is here today for [employer] application #3. Fortunately he seems to be doing extremely well with regard to this and we are seeing a lot of new epithelial growth which is great news. Fortunately there is no signs of active  infection at this time. 10/16/2020 upon evaluation today patient appears to be doing well with regard to his foot ulcers. Do believe the Oasis has been of benefit for him. I do not see any signs of infection right now which is great news and I think that he has a lot of new epithelial growth which is great to see as well. The patient is very pleased to hear all of this. I do think we can proceed with the Oasis trilayer #4 today. 3/18; not as much improvement in these areas on his heels that I was hoping. I did reapply trilateral Oasis today the tissue looks healthier but not as much fill in as I was hoping. 3/25; better looking today I think this is come in a bit the tissue looks healthier. Triple layer Oasis reapplied #6 4/1; somewhat better looking definitely better looking surface not as much change in surface area as I was hoping. He may be spending more time [place] on days then he needs to although he does have heel offloading boots. Triple layer Oasis reapplied #7 4/7; unfortunately apparently [facility] will not approve any further Oasis which is unfortunate since the patient did respond nicely both in terms of the condition of the wound bed as well as surface area. There is still some  drainage coming from the wound but not a lot there does not appear to be any infection 4/15; we have been using Hydrofera Blue. He continues to have nice rims of epithelialization on the right greater than the left. The left the epithelialization is coming from the tip of his heel. There is moderate drainage. In this that concerns me about a total contact cast. There is no evidence of infection 4/29; patient has been using Hydrofera Blue with dressing changes. He has no complaints or issues today. 5/5; using Hydrofera Blue. I actually think that he looks marginally better than the last time I saw this 3 weeks ago. There are rims of epithelialization on the left thumb coming from the medial side on the  right. Using Hydrofera Blue 5/12; using Hydrofera Blue. These continue to make improvements in surface area. His drainage was not listed as severe I therefore went ahead and put a cast on the left foot. Right foot we will continue to dress his previous 5/16; back for first total contact cast change. He did not tolerate this particularly well cast injury on the anterior tibia among other issues. Difficulty sleeping. I talked him about this in some detail and afterwards is elected to continue. I told him I would like to have a cast on for 3 weeks to see if this is going to help at all. I think he agreed 5/19; I think the wound is better. There is no tunneling towards his midfoot. The undermining medially also looks better. He has a rim of new skin distally. I think we are making progress here. The area on the left also continues to look somewhat better to me using Hydrofera Blue. He has a list of complaints about the cast but none of them seem serious 5/26; patient presents for 1 week follow-up. He has been using a total contact cast and tolerating this well. Hydrofera Blue is the main dressing used. He denies signs of infection. 6/2 Hydrofera Blue total contact cast on the left. These were large ulcers that formed in intensive care unit where the patient was recovering from COVID. May have had something to do with being ventilated in an upright positiono Pressors etc. We have been able to get the areas down considerably and a viable surface. There is some epithelialization in both sides. Note made of drainage 6/9; changed to [facility] last time because of drainage. He arrives with better looking surfaces and dimensions on the left than the right. Paradoxically the right actually probes more towards his midfoot the left is largely close down but both of these look improved. Using a total contact cast on the left 6/16; complex wounds on his bilateral plantar heels which were initially pressure  injury from a stay in the ICU with COVID. We have been using silver alginate most recently. His dimensions of come in quite dramatically however not recently. We have been putting the left foot in a total contact cast 6/23; complex wounds on the bilateral plantar heels. I been putting the left in the cast paradoxically the area on the right is the one that is going towards closure at a faster rate. Quite a bit of drainage on the left. The patient went to see Dr. Logan Bores who said he was going to standby for skin grafts. I had actually considered sending him for skin grafts however he would be mandatorily off his feet for a period of weeks to months. I am thinking that the area on the right is  going to close on its own the area on the left has been more stubborn even though we have him in a total contact cast 6/30; took him out of a total contact cast last week is the right heel seem to be making better progress than the left where I was placing the cast. We are using silver alginate. Both wounds are smaller right greater than left 7/12; both wounds look as though they are making some progress. We are using silver alginate. Heel offloading boots 7/26; very gradual progress especially on the right. Using silver alginate. He is wearing heel offloading boots 8/18; he continues to close these wounds down very gradually. Using silver alginate. The problem polymen being definitive about this is areas of what appears to be callus around the margins. This is not a 100% of the area but certainly sizable especially on the right 9/1; bilateral plantar feet wounds secondary to prolonged pressure while being ventilated for COVID-19 in an upright position. Essentially pressure ulcers on the bottom of his feet. He is made substantial progress using silver alginate. 9/14; bilateral plantar feet wounds secondary to prolonged pressure. Making progress using silver alginate. 9/29 bilateral plantar feet wounds secondary to  prolonged pressure. I changed him to Iodoflex last week. MolecuLight showing reddened blush fluorescence 10/11; patient presents for follow-up. He has no issues or complaints today. He denies signs of infection. He continues to use Iodoflex and antibiotic ointment to the wound beds. 10/27; 2-week follow-up. No evidence of infection. He has callus and thick dry skin around the wound margins we have been using Iodoflex and Bactroban which was in response to a moderate left MolecuLight reddish blush fluorescence. 11/10; 2-week follow-up. Wound margins again have thick callus however the measurements of the actual wound sites are a lot smaller. Everything looks reasonably healthy here. We have been using Iodoflex He was approved for prime matrix but I have elected to delay this given the improvement in the surface area. Hopefully I will not regret that decision as were getting close to the end of the year in terms of insurance payment 12/8; 2-week follow-up. Wounds are generally smaller in size. These were initially substantial wounds extending into the forefoot all the way into the heel on the bilateral plantar feet. They are now both located on the plantar heel distal aspect both of these have a lot of callus around the wounds I used a #5 curette to remove this on the right and the left also some subcutaneous debris to try and get the wound edges were using Iodoflex. He has heel offloading shoe 12/22; 2-week follow-up. Not really much improvement. He has thick callus around the outer edges of both wounds. I remove this there is some nonviable subcutaneous tissue as well. We have been using Iodoflex. Her intake nurse and myself spontaneously thought of a total contact cast I went back in May. At that time we really were not seeing much of an improvement with a cast although the wound was in a much different situation I would like to retry this in 2 weeks and I discussed this with the patient 08/12/2021;  the patient has had some improvement with the Iodoflex. The the area on the left heel plantar more improved than the right. I had to put him in a total contact cast on the left although I decided to put that off for 2 weeks. I am going to change his primary dressing to silver collagen. I think in both areas he has  had some improvement most of the healing seems to be more proximal in the heel. The wounds are in the mid aspect. A lot of thick callus on the right heel however. 1/19; we are using silver collagen on both plantar heel areas. He has had some improvement today. The left did not require any debridement. He still had some eschar on the right that was debrided but both seem to have contracted. I did not put it total contact cast on him today 2/2 we have been using silver collagen. The area on the right plantar heel has areas that appear to be epithelialized interspersed with dry flaking callus and dry skin. I removed this. This really looks better than on the other side. On the left still a large area with raised edges and debris on the surface. 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 He has no issues or complaints today. 2/9; not much change in the left foot wound with 1 week of a cast we are using silver collagen. Silver collagen on the right side. The right side has been the better wound surface. We will reapply the total contact cast on the left 2/16; not much improvement on either side I been using silver collagen with a total contact cast on the left. I'm changing the Hydrofera Blue still with a total contact cast on the left 2/23; some improvement on both sides. Disappointing that he has thick callus around the area that we are putting in a total contact cast on the left. We've been using Hydrofera Blue on both wound areas. This is a man who at  essentially pressure ulcers in addition to ischemia caused by medications to support his blood pressure (pressors) in the ICU. He was being ventilated in the standing position for severe Covid. A Shiley the wounds extended across his entire foot but are now localized to his plantar heels bilaterally. We have made progress however neither areas healed. I continue to think the total contact cast is helped albeit painstakingly slowly. He has never wanted a plastic surgery consult although I don't know that they would be interested in grafting in area in this location. 10/07/2021: Continued improvement bilaterally. There is still some callus around the left wound, despite the total contact cast. He has some increased pain in his right midfoot around 1 particular area. This has been painful in the past but seems to be a little bit worse. When his cast was removed today, there was an area on the heel of the left foot that looks a bit macerated. He is also complaining of pain in his left thigh and hip which he thinks is secondary to the limb length discrepancy caused by the cast. 10/14/2021: He continues to improve. A little bit less callus around the left wound. He continues to endorse pain in his right midfoot, but this is not as significant as it was last week. The maceration on his left heel is improved. 10/21/2021: Continued improvement to both wounds. The maceration on his left heel is no longer evident. Less callus bilaterally. Epithelialization progressing. 10/28/2021: Significant improvement this week. The right sided wound is nearly closed with just a small open area at the middle. No maceration seen on the left heel. Continued epithelialization on both sides. No concern for infection. 11/04/2021: T oday, the wounds were measured a little bit differently and come  out as larger, but I actually think they are about the same to potentially even smaller, particularly on the left. He continues to accumulate  some callus on the right. 11/11/2021: T oday, the patient is expressing some concern that the left wound, despite being in the total contact cast, is not progressing at the same rate as the 1 on the right. He is interested in trying a week without the cast to see how the wound does. The wounds are roughly the same size as last week, with the right perhaps being a little bit smaller. He continues to build up callus on both sites. 11/18/2021: Last week, I permitted the patient to go without his total contact cast, just to see if the cast was really making any difference. Today, both wounds have deteriorated to some extent, suggesting that the cast is providing benefit, at least on the left. Both are larger and have accumulated callus, slough, and other debris. 11/26/2021: I debrided both wounds quite aggressively last week in an effort to stimulate the healing cascade. This appears to have been effective as the left sided wound is a full centimeter shorter in length. Although the right was measured slightly larger, on inspection, it looks as though an area of epithelialized tissue was included in the measurements. We have been using PolyMem Ag on the wound surfaces with a total contact cast to the left. 12/02/2021: It appears that the intake personnel are including epithelialized tissue in his wound measurements; the right wound is almost completely epithelialized; there is just a crater at the proximal midfoot with some open areas. On the left, he has built up some callus, but the overall wound surface looks good. There is some senescent skin around the wound margin. He has been in PolyMem Ag bilaterally with a total contact cast on the left. 12/09/2021: The right wound is nearly closed; there is just a small open area at the mid calcaneus. On the left, the wound is smaller with minimal callus buildup. No significant drainage. 12/16/2021: The right calcaneal wound remains minimally open at the mid calcaneus;  the rest has epithelialized. On the left, the wound is also a little bit smaller. There is some senescent tissue on the periphery. He is getting his first application of a trial skin substitute called Vendaje today. 12/23/2021: The wound on his right calcaneus is nearly closed; there is just a small area at the most distal aspect of the calcaneus that is open. On the left, the area where we applied to the skin substitute has a healthier-looking bed of granulation tissue. The wound dimensions are not significantly different on this side but the wound surface is improved. 12/30/2021: The wound on the right calcaneus has not changed significantly aside from some accumulation of callus. On the left, the open area is smaller and continues to have an improved surface. He continues to accumulate callus around the wound. He is here for his third application of Vendaje. 01/06/2022: The right calcaneal wound is down to just a couple of millimeters. He continues to accumulate periwound callus. He unfortunately got his cast wet earlier in the week and his left foot is macerated, resulting in some superficial skin loss just distal to the open wound. The open wound itself, however, is much smaller and has a healthier appearing surface. He is here for his fourth application of Vendaje. 01/13/2022: The right calcaneal wound is about the same. Unfortunately, once again, his cast got wet and his foot is again macerated. This  is caused the left calcaneal wound to enlarge. He is here for his fifth application of Vendaje. 01/20/2022: The right calcaneal wound is very small. There is some periwound callus accumulation. He purchased a new cast protector last week and this has been effective in avoiding the maceration that has been occurring on the left. The left calcaneal wound is narrower and has a healthy and viable-appearing surface. He is here for his 6 application of Vendaje. 01/27/2022: The right calcaneal wound is down to  just a pinhole. There is some periwound slough and callus. On the left, the wound is narrower and shorter by about a centimeter. The surface is robust and viable-appearing. Unfortunately, the rep for the trial skin substitute product did not provide any for [place] to use today. 02/04/2022: The right calcaneal wound remains unchanged. There is more accumulated callus. On the left, although the intake nurse measured it a little bit longer, it looks about the same to me. The surface has a layer of slough, but underneath this, there is good granulation tissue. 02/10/2022: The right calcaneus wound is nearly closed. There is still some callus that builds up around the site. The left side looks about the same in terms of dimensions, but the surface is more robust and vital-appearing. 02/16/2022: The area of the right calcaneus that was nearly closed last week has closed, but there is a small opening at the mid foot where it looks like some moisture got retained and caused some reopening. The left foot wound is narrower and shallower. Both sites have a fair amount of periwound callus and eschar. Patient History Information obtained from Patient. Family History Cancer - Maternal Grandparents, Diabetes - Father,Paternal Grandparents, Heart Disease - Maternal Grandparents, Hypertension - Father,Paternal Grandparents, Lung Disease - Siblings, No family history of Hereditary Spherocytosis, Kidney Disease, Seizures, Stroke, Thyroid Problems, Tuberculosis. Social History Never smoker, Marital Status - Married, Alcohol Use - Never, Drug Use - No History, Caffeine Use - Daily - tea, soda. Medical History Eyes Denies history of Cataracts, Glaucoma, Optic Neuritis Ear/Nose/Mouth/Throat Denies history of Chronic sinus problems/congestion, Middle ear problems Hematologic/Lymphatic Denies history of Anemia, Hemophilia, Human Immunodeficiency Virus, Lymphedema, Sickle Cell Disease Respiratory Patient has history of  Asthma Denies history of Aspiration, Chronic Obstructive Pulmonary Disease (COPD), Pneumothorax, Sleep Apnea, Tuberculosis Cardiovascular Patient has history of Angina - with COVID, Hypertension Denies history of Arrhythmia, Congestive Heart Failure, Coronary Artery Disease, Deep Vein Thrombosis, Hypotension, Myocardial Infarction, Peripheral Arterial Disease, Peripheral Venous Disease, Phlebitis, Vasculitis Gastrointestinal Denies history of Cirrhosis , Colitis, Crohnoos, Hepatitis A, Hepatitis B, Hepatitis C Endocrine Denies history of Type I Diabetes, Type II Diabetes Genitourinary Denies history of End Stage Renal Disease Immunological Denies history of Lupus Erythematosus, Raynaudoos, Scleroderma Integumentary (Skin) Denies history of History of Burn Musculoskeletal Denies history of Gout, Rheumatoid Arthritis, Osteoarthritis, Osteomyelitis Neurologic Denies history of Dementia, Neuropathy, Quadriplegia, Paraplegia, Seizure Disorder Oncologic Denies history of Received Chemotherapy, Received Radiation Psychiatric Denies history of Anorexia/bulimia, Confinement Anxiety Hospitalization/Surgery History - COVID PNA 07/22/2019- 11/14/2019. - 03/27/2020 wound debridement/ skin graft. Medical A Surgical History Notes nd Constitutional Symptoms (General Health) COVID PNA 07/22/2019-11/14/2019 [facility] ECMO, foot drop left foot , Genitourinary kidney stone Psychiatric anxiety Objective Constitutional No acute distress.. Vitals Time Taken: 9:34 AM, Height: 69 in, Weight: 280 lbs, BMI: 41.3, Temperature: 98.5 F, Pulse: 89 bpm, Respiratory Rate: 18 breaths/min, Blood Pressure: 129/69 mmHg. General Notes: pt does not check blood sugars at home Respiratory Normal work of breathing on room air.. General Notes: 02/16/2022:  The area of the right calcaneus that was nearly closed last week has closed, but there is a small opening at the mid foot where it looks like some moisture got  retained and caused some reopening. The left foot wound is narrower and shallower. Both sites have a fair amount of periwound callus and eschar. Integumentary (Hair, Skin) Wound #1 status is Open. Original cause of wound was Pressure Injury. The date acquired was: 10/07/2019. The wound has been in treatment 83 weeks. The wound is located on the Right Calcaneus. The wound measures 0.4cm length x 1.1cm width x 0.1cm depth; 0.346cm^2 area and 0.035cm^3 volume. There is Fat Layer (Subcutaneous Tissue) exposed. There is no tunneling or undermining noted. There is a medium amount of serosanguineous drainage noted. The wound margin is thickened. There is small (1-33%) pink granulation within the wound bed. There is a large (67-100%) amount of necrotic tissue within the wound bed including Eschar and Adherent Slough. Wound #2 status is Open. Original cause of wound was Pressure Injury. The date acquired was: 10/07/2019. The wound has been in treatment 83 weeks. The wound is located on the Left Calcaneus. The wound measures 2.8cm length x 0.7cm width x 0.3cm depth; 1.539cm^2 area and 0.462cm^3 volume. There is Fat Layer (Subcutaneous Tissue) exposed. There is no tunneling or undermining noted. There is a medium amount of serosanguineous drainage noted. The wound margin is distinct with the outline attached to the wound base. There is large (67-100%) red, pink granulation within the wound bed. There is a small (1-33%) amount of necrotic tissue within the wound bed including Adherent Slough. Assessment Active Problems ICD-10 Non-pressure chronic ulcer of other part of right foot with fat layer exposed Non-pressure chronic ulcer of other part of left foot with fat layer exposed Procedures Wound #1 Pre-procedure diagnosis of Wound #1 is a Pressure Ulcer located on the Right Calcaneus . There was a Excisional Skin/Subcutaneous Tissue Debridement with a total area of 0.44 sq cm performed by Fredirick Maudlin, MD.  With the following instrument(s): Curette to remove Non-Viable tissue/material. Material removed includes Callus, Subcutaneous Tissue, and Slough after achieving pain control using Lidocaine 4% T opical Solution. No specimens were taken. A time out was conducted at 10:00, prior to the start of the procedure. A Minimum amount of bleeding was controlled with Pressure. The procedure was tolerated well with a pain level of 0 throughout and a pain level of 0 following the procedure. Post Debridement Measurements: 0.4cm length x 1.1cm width x 0.1cm depth; 0.035cm^3 volume. Post debridement Stage noted as Category/Stage III. Character of Wound/Ulcer Post Debridement is improved. Post procedure Diagnosis Wound #1: Same as Pre-Procedure Wound #2 Pre-procedure diagnosis of Wound #2 is a Pressure Ulcer located on the Left Calcaneus . There was a T Contact Cast Procedure by Fredirick Maudlin, MD. otal Post procedure Diagnosis Wound #2: Same as Pre-Procedure Plan Follow-up Appointments: Return Appointment in 1 week. - Dr [name] - Room 3 Thursday July 20th at 9:30am and Thursday July 20th at 9:30am Cellular or Tissue Based Products: Cellular or Tissue Based Product Type: - Vendaje #8 (trial) ( Applied to Right and Left heels) Cellular or Tissue Based Product applied to wound bed, secured with steri-strips, cover with Adaptic or Mepitel. (DO NOT REMOVE). Bathing/ Shower/ Hygiene: May shower with protection but do not get wound dressing(s) wet. - Cover with cast protector (can purchase cast protector at [facility] or [facility] ) Edema Control - Lymphedema / SCD / Other: Elevate legs to the level  of the heart or above for 30 minutes daily and/or when sitting, a frequency of: - throughout the day Avoid standing for long periods of time. Moisturize legs daily. - right leg and foot every night. Off-Loading: T Contact Cast to Left Lower Extremity otal Wedge shoe to: - Globoped Shoe to right foot and left foot when not  in TCC Other: - keep pressure off of the bottom of your feet Additional Orders / Instructions: Follow Nutritious Diet WOUND #1: - Calcaneus Wound Laterality: Right Cleanser: Normal Saline (Generic) Every Other Day/30 Days Discharge Instructions: Cleanse the wound with Normal Saline prior to applying a clean dressing using gauze sponges, not tissue or cotton balls. Cleanser: Wound Cleanser (Generic) Every Other Day/30 Days Discharge Instructions: Cleanse the wound with wound cleanser prior to applying a clean dressing using gauze sponges, not tissue or cotton balls. Prim Dressing: VENDAJE Every Other Day/30 Days ary Secondary Dressing: Zetuvit Plus 4x8 in (Generic) Every Other Day/30 Days Discharge Instructions: Apply over primary dressing as directed. Secured With: Elastic Bandage 4 inch (ACE bandage) (Generic) Every Other Day/30 Days Discharge Instructions: Secure with ACE bandage as directed. Secured With: [employer], 4.5x3.1 (in/yd) (Generic) Every Other Day/30 Days Discharge Instructions: Secure with Kerlix as directed. WOUND #2: - Calcaneus Wound Laterality: Left Cleanser: Normal Saline (Generic) 1 x Per Week/30 Days Discharge Instructions: Cleanse the wound with Normal Saline prior to applying a clean dressing using gauze sponges, not tissue or cotton balls. Cleanser: Wound Cleanser 1 x Per Week/30 Days Discharge Instructions: Cleanse the wound with wound cleanser prior to applying a clean dressing using gauze sponges, not tissue or cotton balls. Peri-Wound Care: Zinc Oxide Ointment 30g tube 1 x Per Week/30 Days Discharge Instructions: Apply Zinc Oxide to periwound with each dressing change Prim Dressing: VENDAJE 1 x Per Week/30 Days ary Secondary Dressing: Optifoam Non-Adhesive Dressing, 4x4 in (Generic) 1 x Per Week/30 Days Discharge Instructions: Apply over primary dressing as directed. Secondary Dressing: Zetuvit Plus 4x8 in (Generic) 1 x Per Week/30 Days Discharge  Instructions: Apply over primary dressing as directed. Secured With: [employer], 4.5x3.1 (in/yd) (Generic) 1 x Per Week/30 Days Discharge Instructions: Secure with Kerlix as directed. Secured With: 72M Medipore H Soft Cloth Surgical T ape, 4 x 10 (in/yd) (Generic) 1 x Per Week/30 Days Discharge Instructions: Secure with tape as directed. 02/16/2022: The area of the right calcaneus that was nearly closed last week has closed, but there is a small opening at the mid foot where it looks like some moisture got retained and caused some reopening. The left foot wound is narrower and shallower. Both sites have a fair amount of periwound callus and eschar. I debrided the macerated skin and callus from the right calcaneus. Both wounds were prepared for application of Vendaje (trial product, no charge). The product was applied in standard fashion and secured in place with Adaptic and Steri-Strips. Gauze bolsters were used to ensure good contact of the product with the wound surfaces. A total contact cast was applied on the left. Follow-up in 1 week. Electronic Signature(s) Signed: 02/16/2022 10:34:41 AM By: Fredirick Maudlin MD FACS Entered By: Fredirick Maudlin on 02/16/2022 10:34:41 -------------------------------------------------------------------------------- HxROS Details Patient Name: Date of Service: [name], Brave 02/16/2022 9:30 A M Medical Record Number: [id number] Patient Account Number: [id number] Date of Birth/Sex: Treating RN: [id number] (46 y.o. [name] Primary Care Provider: [name] Other Clinician: Referring Provider: Treating Provider/Extender: [name] in Treatment: 44 Information Obtained  From Patient Constitutional Symptoms (General Health) Medical History: Past Medical History Notes: COVID PNA 07/22/2019-11/14/2019 [facility] ECMO, foot drop left foot , Eyes Medical History: Negative for: Cataracts; Glaucoma; Optic  Neuritis Ear/Nose/Mouth/Throat Medical History: Negative for: Chronic sinus problems/congestion; Middle ear problems Hematologic/Lymphatic Medical History: Negative for: Anemia; Hemophilia; Human Immunodeficiency Virus; Lymphedema; Sickle Cell Disease Respiratory Medical History: Positive for: Asthma Negative for: Aspiration; Chronic Obstructive Pulmonary Disease (COPD); Pneumothorax; Sleep Apnea; Tuberculosis Cardiovascular Medical History: Positive for: Angina - with COVID; Hypertension Negative for: Arrhythmia; Congestive Heart Failure; Coronary Artery Disease; Deep Vein Thrombosis; Hypotension; Myocardial Infarction; Peripheral Arterial Disease; Peripheral Venous Disease; Phlebitis; Vasculitis Gastrointestinal Medical History: Negative for: Cirrhosis ; Colitis; Crohns; Hepatitis A; Hepatitis B; Hepatitis C Endocrine Medical History: Negative for: Type I Diabetes; Type II Diabetes Genitourinary Medical History: Negative for: End Stage Renal Disease Past Medical History Notes: kidney stone Immunological Medical History: Negative for: Lupus Erythematosus; Raynauds; Scleroderma Integumentary (Skin) Medical History: Negative for: History of Burn Musculoskeletal Medical History: Negative for: Gout; Rheumatoid Arthritis; Osteoarthritis; Osteomyelitis Neurologic Medical History: Negative for: Dementia; Neuropathy; Quadriplegia; Paraplegia; Seizure Disorder Oncologic Medical History: Negative for: Received Chemotherapy; Received Radiation Psychiatric Medical History: Negative for: Anorexia/bulimia; Confinement Anxiety Past Medical History Notes: anxiety Immunizations Pneumococcal Vaccine: Received Pneumococcal Vaccination: No Implantable Devices None Hospitalization / Surgery History Type of Hospitalization/Surgery COVID PNA 07/22/2019- 11/14/2019 03/27/2020 wound debridement/ skin graft Family and Social History Cancer: Yes - Maternal Grandparents; Diabetes: Yes -  Father,Paternal Grandparents; Heart Disease: Yes - Maternal Grandparents; Hereditary Spherocytosis: No; Hypertension: Yes - Father,Paternal Grandparents; Kidney Disease: No; Lung Disease: Yes - Siblings; Seizures: No; Stroke: No; Thyroid Problems: No; Tuberculosis: No; Never smoker; Marital Status - Married; Alcohol Use: Never; Drug Use: No History; Caffeine Use: Daily - tea, soda; Financial Concerns: No; Food, Clothing or Shelter Needs: No; Support System Lacking: No; Transportation Concerns: No Engineer, maintenance) Signed: 02/16/2022 12:27:51 PM By: Fredirick Maudlin MD FACS Signed: 02/16/2022 6:09:16 PM By: [name] Entered By: Fredirick Maudlin on 02/16/2022 10:30:02 -------------------------------------------------------------------------------- Total Contact Cast Details Patient Name: Date of Service: [name], [place] 02/16/2022 9:30 A M Medical Record Number: [id number] Patient Account Number: [id number] Date of Birth/Sex: Treating RN: 26-Jul-1976 (46 y.o. [name] Primary Care Provider: [name] Other Clinician: Referring Provider: Treating Provider/Extender: [name] in Treatment: 98 T Contact Cast Applied for Wound Assessment: otal Wound #2 Left Calcaneus Performed By: Physician Fredirick Maudlin, MD Post Procedure Diagnosis Same as Pre-procedure Electronic Signature(s) Signed: 02/16/2022 12:27:51 PM By: Fredirick Maudlin MD FACS Signed: 02/16/2022 6:09:16 PM By: [name] Entered By: [name] on 02/16/2022 10:15:03 -------------------------------------------------------------------------------- SuperBill Details Patient Name: Date of Service: [name], [place]. 02/16/2022 Medical Record Number: [id number] Patient Account Number: [id number] Date of Birth/Sex: Treating RN: 1975/09/05 (46 y.o. [name] Primary Care Provider: [name] Other Clinician: Referring Provider: Treating Provider/Extender:  [name] in Treatment: 88 Diagnosis Coding ICD-10 Codes Code Description [phone] Non-pressure chronic ulcer of other part of right foot with fat layer exposed L97.522 Non-pressure chronic ulcer of other part of left foot with fat layer exposed Facility Procedures CPT4 Code: [id number] Description: [place] - DEB SUBQ TISSUE 20 SQ CM/< ICD-10 Diagnosis Description L97.512 Non-pressure chronic ulcer of other part of right foot with fat layer exposed Modifier: Quantity: 1 CPT4 Code: [id number] Description: [id number] - APPLY TOTAL CONTACT LEG CAST ICD-10 Diagnosis Description L97.522 Non-pressure chronic ulcer of other part of left foot with fat layer exposed Modifier:  Quantity: 1 Physician Procedures : CPT4 Code Description Modifier [id number] - WC PHYS LEVEL 3 - EST PT 25 ICD-10 Diagnosis Description L97.512 Non-pressure chronic ulcer of other part of right foot with fat layer exposed L97.522 Non-pressure chronic ulcer of other part of left foot  with fat layer exposed Quantity: 1 : [id number] - WC PHYS SUBQ TISS 20 SQ CM ICD-10 Diagnosis Description L97.512 Non-pressure chronic ulcer of other part of right foot with fat layer exposed Quantity: 1 : [id number] - WC PHYS APPLY TOTAL CONTACT CAST ICD-10 Diagnosis Description L97.522 Non-pressure chronic ulcer of other part of left foot with fat layer exposed Quantity: 1 Electronic Signature(s) Signed: 02/16/2022 10:35:07 AM By: Fredirick Maudlin MD FACS Entered By: Fredirick Maudlin on 02/16/2022 10:35:07

## 2022-02-19 ENCOUNTER — Other Ambulatory Visit: Payer: Self-pay | Admitting: Behavioral Health

## 2022-02-19 DIAGNOSIS — F4321 Adjustment disorder with depressed mood: Secondary | ICD-10-CM

## 2022-02-19 DIAGNOSIS — F411 Generalized anxiety disorder: Secondary | ICD-10-CM

## 2022-02-19 DIAGNOSIS — F331 Major depressive disorder, recurrent, moderate: Secondary | ICD-10-CM

## 2022-02-21 ENCOUNTER — Other Ambulatory Visit: Payer: Self-pay

## 2022-02-21 MED ORDER — LAMOTRIGINE 100 MG PO TABS: 100.0000 mg | ORAL_TABLET | Freq: Every day | ORAL | 0 refills | Status: DC

## 2022-02-24 ENCOUNTER — Encounter (HOSPITAL_BASED_OUTPATIENT_CLINIC_OR_DEPARTMENT_OTHER): Payer: PPO | Admitting: General Surgery

## 2022-02-24 DIAGNOSIS — L89613 Pressure ulcer of right heel, stage 3: Secondary | ICD-10-CM | POA: Diagnosis not present

## 2022-02-24 DIAGNOSIS — L89629 Pressure ulcer of left heel, unspecified stage: Secondary | ICD-10-CM | POA: Diagnosis not present

## 2022-02-24 DIAGNOSIS — L97512 Non-pressure chronic ulcer of other part of right foot with fat layer exposed: Secondary | ICD-10-CM | POA: Diagnosis not present

## 2022-02-28 NOTE — Progress Notes (Signed)
RAFEEQ, BACH ([id number]) Visit Report for 02/24/2022 Chief Complaint Document Details Patient Name: Date of Service: [name] [place] 02/24/2022 9:30 A M Medical Record Number: [id number] Patient Account Number: [id number] Date of Birth/Sex: Treating RN: [birth year] (46 y.o. [name] Primary Care Provider: [name] Other Clinician: Referring Provider: Treating Provider/Extender: [name] in Treatment: 56 Information Obtained from: Patient Chief Complaint Bilateral Plantar Foot Ulcers Electronic Signature(s) Signed: 02/24/2022 10:18:34 AM By: Fredirick Maudlin MD FACS Entered By: Fredirick Maudlin on 02/24/2022 10:18:34 -------------------------------------------------------------------------------- Cellular or Tissue Based Product Details Patient Name: Date of Service: [name], [place] 02/24/2022 9:30 A M Medical Record Number: [id number] Patient Account Number: [id number] Date of Birth/Sex: Treating RN: May 16, 1976 (46 y.o. [name] Primary Care Provider: [name] Other Clinician: Referring Provider: Treating Provider/Extender: [name] in Treatment: 44 Cellular or Tissue Based Product Type Wound #2 Left Calcaneus Applied to: Performed By: Physician Fredirick Maudlin, MD Cellular or Tissue Based Product Type: Other Level of Consciousness (Pre-procedure): Awake and Alert Pre-procedure Verification/Time Out Yes - 09:56 Taken: Location: genitalia / hands / feet / multiple digits Wound Size (sq cm): 3.12 Product Size (sq cm): 8 Waste Size (sq cm): 0 Amount of Product Applied (sq cm): 8 Instrument Used: Forceps, Scissors Lot #: [id number] Order #: [id number] Expiration Date: 10/12/2024 Fenestrated: No Secured: Yes Secured With: Steri-Strips Dressing Applied: Yes Primary Dressing: adaptic Procedural Pain: 0 Post Procedural Pain: 0 Response to Treatment: Procedure was tolerated well Level  of Consciousness (Post- Awake and Alert procedure): Post Procedure Diagnosis Same as Pre-procedure Electronic Signature(s) Signed: 02/24/2022 12:47:40 PM By: Fredirick Maudlin MD FACS Signed: 02/24/2022 5:36:35 PM By: Sharyn Creamer RN, BSN Entered By: Sharyn Creamer on 02/24/2022 10:09:28 -------------------------------------------------------------------------------- Cellular or Tissue Based Product Details Patient Name: Date of Service: [name], [place]. 02/24/2022 9:30 A M Medical Record Number: [id number] Patient Account Number: [id number] Date of Birth/Sex: Treating RN: [birth year] (46 y.o. [name] Primary Care Provider: [name] Other Clinician: Referring Provider: Treating Provider/Extender: [name] in Treatment: 3 Cellular or Tissue Based Product Type Wound #1 Right Calcaneus Applied to: Performed By: Physician Fredirick Maudlin, MD Cellular or Tissue Based Product Type: Other Level of Consciousness (Pre-procedure): Awake and Alert Pre-procedure Verification/Time Out Yes - 09:56 Taken: Location: genitalia / hands / feet / multiple digits Wound Size (sq cm): 0.25 Product Size (sq cm): 8 Waste Size (sq cm): 0 Amount of Product Applied (sq cm): 8 Instrument Used: Forceps, Scissors Lot #: [id number] Order #: [id number] Expiration Date: 10/12/2024 Fenestrated: No Secured: Yes Secured With: Steri-Strips Dressing Applied: Yes Primary Dressing: adaptic Procedural Pain: 0 Post Procedural Pain: 0 Response to Treatment: Procedure was tolerated well Level of Consciousness (Post- Awake and Alert procedure): Post Procedure Diagnosis Same as Pre-procedure Notes Trial**** [place] Electronic Signature(s) Signed: 02/24/2022 12:47:40 PM By: Fredirick Maudlin MD FACS Signed: 02/24/2022 5:36:35 PM By: Sharyn Creamer RN, BSN Entered By: Sharyn Creamer on 02/24/2022  10:10:38 -------------------------------------------------------------------------------- Debridement Details Patient Name: Date of Service: [name], [place] 02/24/2022 9:30 A M Medical Record Number: [id number] Patient Account Number: [id number] Date of Birth/Sex: Treating RN: 14-Jun-1976 (46 y.o. [name] Primary Care Provider: [name] Other Clinician: Referring Provider: Treating Provider/Extender: [name] in Treatment: 84 Debridement Performed for Assessment: Wound #1 Right Calcaneus Performed By: Physician Fredirick Maudlin, MD Debridement Type: Debridement Level of Consciousness (Pre-procedure): Awake and Alert Pre-procedure Verification/Time Out Yes -  09:56 Taken: Start Time: 09:56 Pain Control: Lidocaine 5% topical ointment T Area Debrided (L x W): otal 0.5 (cm) x 0.5 (cm) = 0.25 (cm) Tissue and other material debrided: Non-Viable, Slough, Slough Level: Non-Viable Tissue Debridement Description: Selective/Open Wound Instrument: Curette Bleeding: Minimum Hemostasis Achieved: Pressure Procedural Pain: 0 Post Procedural Pain: 0 Response to Treatment: Procedure was tolerated well Level of Consciousness (Post- Awake and Alert procedure): Post Debridement Measurements of Total Wound Length: (cm) 0.5 Stage: Category/Stage III Width: (cm) 0.5 Depth: (cm) 0.1 Volume: (cm) 0.02 Character of Wound/Ulcer Post Debridement: Improved Post Procedure Diagnosis Same as Pre-procedure Electronic Signature(s) Signed: 02/24/2022 12:47:40 PM By: Fredirick Maudlin MD FACS Signed: 02/24/2022 5:36:35 PM By: Sharyn Creamer RN, BSN Entered By: Sharyn Creamer on 02/24/2022 09:59:24 -------------------------------------------------------------------------------- HPI Details Patient Name: Date of Service: [name], [place] 02/24/2022 9:30 A M Medical Record Number: [id number] Patient Account Number: [id number] Date of Birth/Sex: Treating  RN: 06/16/1976 (46 y.o. [name] Primary Care Provider: [name] Other Clinician: Referring Provider: Treating Provider/Extender: [name] in Treatment: 56 History of Present Illness HPI Description: Wounds are12/03/2020 upon evaluation today patient presents for initial inspection here in our clinic concerning issues he has been having with the bottoms of his feet bilaterally. He states these actually occurred as wounds when he was hospitalized for 5 months secondary to Covid. He was apparently with tilting bed where he was in an upright position quite frequently and apparently this occurred in some way shape or form during that time. Fortunately there is no sign of active infection at this time. No fevers, chills, nausea, vomiting, or diarrhea. With that being said he still has substantial wounds on the plantar aspects of his feet Theragen require quite a bit of work to get these to heal. He has been using Santyl currently though that is been problematic both in receiving the medication as well as actually paid for it as it is become quite expensive. Prior to the experience with Covid the patient really did not have any major medical problems other than hypertension he does have some mild generalized weakness following the Covid experience. 07/22/2020 on evaluation today patient appears to be doing okay in regard to his foot ulcers I feel like the wound beds are showing signs of better improvement that I do believe the Iodoflex is helping in this regard. With that being said he does have a lot of drainage currently and this is somewhat blue/green in nature which is consistent with Pseudomonas. I do think a culture today would be appropriate for [place] to evaluate and see if that is indeed the case I would likely start him on antibiotic orally as well he is not allergic to Cipro knows of no issues he has had in the past 12/21; patient was admitted to the clinic  earlier this month with bilateral presumed pressure ulcers on the bottom of his feet apparently related to excessive pressure from a tilt table arrangement in the intensive care unit. Patient relates this to being on ECMO but I am not really sure that is exactly related to that. I must say I have never seen anything like this. He has fairly extensive full-thickness wounds extending from his heel towards his midfoot mostly centered laterally. There is already been some healing distally. He does not appear to have an arterial issue. He has been using gentamicin to the wound surfaces with Iodoflex to help with ongoing debridement 1/6; this is a patient with pressure  ulcers on the bottom of his feet related to excessive pressure from a standing position in the intensive care unit. He is complaining of a lot of pain in the right heel. He is not a diabetic. 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 We have been using Iodoflex to help prepare the surfaces of both wounds for an advanced treatment product. He is nonambulatory spending most of his time in a wheelchair I have asked him not to propel the wheelchair with his heels 1/13; in general his wounds look better not much surface area change we have been using Iodoflex as of last week. I did an x-ray of the right heel as the patient was complaining of pain. I had some thoughts about a stress fracture perhaps Achilles tendon problems however what it showed was erosive changes along the inferior aspect of the calcaneus he now has a MRI booked for 1/20. 1/20; in general his wounds continue to be better. Some improvement in the large narrow areas proximally in his foot. He is still complaining of pain in the right heel and tenderness in certain areas of this wound. His MRI is tonight. I am not just looking for osteomyelitis that was brought up on the x-ray I am wondering about stress fractures, tendon ruptures etc. He has no such findings  on the left. Also noteworthy is that the patient had critical illness neuropathy and some of the discomfort may be actual improvement in nerve function I am just not sure. These wounds were initially in the setting of severe critical illness related to COVID-19. He was put in a standing position. He may have also been on pressors at the point contributing to tissue ischemia. By his description at some point these wounds were grossly necrotic extending proximally up into the Achilles part of his heel. I do not know that I have ever really seen pictures of them like this although they may exist in epic We have ordered Tri layer Oasis. I am trying to stimulate some granulation in these areas. This is of course assuming the MRI is negative for infection 1/27; since the patient was last here he saw Dr. Earlene Plater of infectious disease. He is planned for vancomycin and ceftriaxone. Prior operative culture grew MSSA. Also ordered baseline lab work. He also ordered arterial studies although the ABIs in our clinic were normal as well as his clinical exam these were normal I do not think he needs to see vascular surgery. His ABIs at the [facility] were 1.22 in the right triphasic waveforms with a normal TBI of 1.15 on the left ABI of 1.22 with triphasic waveforms and a normal TBI of 1.08. Finally he saw Dr. Logan Bores who will follow him in for 2 months. At this point I do not think he felt that he needed a procedure on the right calcaneal bone. Dr. Earlene Plater is elected for broad-spectrum antibiotic The patient is still having pain in the right heel. He walks with a walker 2/3; wounds are generally smaller. He is tolerating his IV antibiotics. I believe this is vancomycin and ceftriaxone. We are still waiting for Oasis burn in terms of his out-of-pocket max which he should be meeting soon given the IV antibiotics, MRIs etc. I have asked him to check in on this. We are using silver collagen in the meantime the wounds look  better 2/10; tolerating IV vancomycin and Rocephin. We are waiting to apply for Oasis. Although I am not really sure where he is  in his out-of-pocket max. 2/17 started the first application of Oasis trilayer. Still on antibiotics. The wounds have generally look better. The area on the left has a little more surface slough requiring debridement 2/24; second application of Oasis trilayer. The wound surface granulation is generally look better. The area on the left with undermining laterally I think is come in a bit. 10/08/2020 upon evaluation today patient is here today for [employer] application #3. Fortunately he seems to be doing extremely well with regard to this and we are seeing a lot of new epithelial growth which is great news. Fortunately there is no signs of active infection at this time. 10/16/2020 upon evaluation today patient appears to be doing well with regard to his foot ulcers. Do believe the Oasis has been of benefit for him. I do not see any signs of infection right now which is great news and I think that he has a lot of new epithelial growth which is great to see as well. The patient is very pleased to hear all of this. I do think we can proceed with the Oasis trilayer #4 today. 3/18; not as much improvement in these areas on his heels that I was hoping. I did reapply trilateral Oasis today the tissue looks healthier but not as much fill in as I was hoping. 3/25; better looking today I think this is come in a bit the tissue looks healthier. Triple layer Oasis reapplied #6 4/1; somewhat better looking definitely better looking surface not as much change in surface area as I was hoping. He may be spending more time [place] on days then he needs to although he does have heel offloading boots. Triple layer Oasis reapplied #7 4/7; unfortunately apparently [facility] will not approve any further Oasis which is unfortunate since the patient did respond nicely both in terms of  the condition of the wound bed as well as surface area. There is still some drainage coming from the wound but not a lot there does not appear to be any infection 4/15; we have been using Hydrofera Blue. He continues to have nice rims of epithelialization on the right greater than the left. The left the epithelialization is coming from the tip of his heel. There is moderate drainage. In this that concerns me about a total contact cast. There is no evidence of infection 4/29; patient has been using Hydrofera Blue with dressing changes. He has no complaints or issues today. 5/5; using Hydrofera Blue. I actually think that he looks marginally better than the last time I saw this 3 weeks ago. There are rims of epithelialization on the left thumb coming from the medial side on the right. Using Hydrofera Blue 5/12; using Hydrofera Blue. These continue to make improvements in surface area. His drainage was not listed as severe I therefore went ahead and put a cast on the left foot. Right foot we will continue to dress his previous 5/16; back for first total contact cast change. He did not tolerate this particularly well cast injury on the anterior tibia among other issues. Difficulty sleeping. I talked him about this in some detail and afterwards is elected to continue. I told him I would like to have a cast on for 3 weeks to see if this is going to help at all. I think he agreed 5/19; I think the wound is better. There is no tunneling towards his midfoot. The undermining medially also looks better. He has a rim of new  skin distally. I think we are making progress here. The area on the left also continues to look somewhat better to me using Hydrofera Blue. He has a list of complaints about the cast but none of them seem serious 5/26; patient presents for 1 week follow-up. He has been using a total contact cast and tolerating this well. Hydrofera Blue is the main dressing used. He denies signs of  infection. 6/2 Hydrofera Blue total contact cast on the left. These were large ulcers that formed in intensive care unit where the patient was recovering from [place]. May have had something to do with being ventilated in an upright positiono Pressors etc. We have been able to get the areas down considerably and a viable surface. There is some epithelialization in both sides. Note made of drainage 6/9; changed to [facility] last time because of drainage. He arrives with better looking surfaces and dimensions on the left than the right. Paradoxically the right actually probes more towards his midfoot the left is largely close down but both of these look improved. Using a total contact cast on the left 6/16; complex wounds on his bilateral plantar heels which were initially pressure injury from a stay in the ICU with COVID. We have been using silver alginate most recently. His dimensions of come in quite dramatically however not recently. We have been putting the left foot in a total contact cast 6/23; complex wounds on the bilateral plantar heels. I been putting the left in the cast paradoxically the area on the right is the one that is going towards closure at a faster rate. Quite a bit of drainage on the left. The patient went to see Dr. Amalia Hailey who said he was going to standby for skin grafts. I had actually considered sending him for skin grafts however he would be mandatorily off his feet for a period of weeks to months. I am thinking that the area on the right is going to close on its own the area on the left has been more stubborn even though we have him in a total contact cast 6/30; took him out of a total contact cast last week is the right heel seem to be making better progress than the left where I was placing the cast. We are using silver alginate. Both wounds are smaller right greater than left 7/12; both wounds look as though they are making some progress. We are using silver alginate.  Heel offloading boots 7/26; very gradual progress especially on the right. Using silver alginate. He is wearing heel offloading boots 8/18; he continues to close these wounds down very gradually. Using silver alginate. The problem polymen being definitive about this is areas of what appears to be callus around the margins. This is not a 100% of the area but certainly sizable especially on the right 9/1; bilateral plantar feet wounds secondary to prolonged pressure while being ventilated for COVID-19 in an upright position. Essentially pressure ulcers on the bottom of his feet. He is made substantial progress using silver alginate. 9/14; bilateral plantar feet wounds secondary to prolonged pressure. Making progress using silver alginate. 9/29 bilateral plantar feet wounds secondary to prolonged pressure. I changed him to Iodoflex last week. MolecuLight showing reddened blush fluorescence 10/11; patient presents for follow-up. He has no issues or complaints today. He denies signs of infection. He continues to use Iodoflex and antibiotic ointment to the wound beds. 10/27; 2-week follow-up. No evidence of infection. He has callus and thick dry skin around  the wound margins we have been using Iodoflex and Bactroban which was in response to a moderate left MolecuLight reddish blush fluorescence. 11/10; 2-week follow-up. Wound margins again have thick callus however the measurements of the actual wound sites are a lot smaller. Everything looks reasonably healthy here. We have been using Iodoflex He was approved for prime matrix but I have elected to delay this given the improvement in the surface area. Hopefully I will not regret that decision as were getting close to the end of the year in terms of insurance payment 12/8; 2-week follow-up. Wounds are generally smaller in size. These were initially substantial wounds extending into the forefoot all the way into the heel on the bilateral plantar feet. They  are now both located on the plantar heel distal aspect both of these have a lot of callus around the wounds I used a #5 curette to remove this on the right and the left also some subcutaneous debris to try and get the wound edges were using Iodoflex. He has heel offloading shoe 12/22; 2-week follow-up. Not really much improvement. He has thick callus around the outer edges of both wounds. I remove this there is some nonviable subcutaneous tissue as well. We have been using Iodoflex. Her intake nurse and myself spontaneously thought of a total contact cast I went back in May. At that time we really were not seeing much of an improvement with a cast although the wound was in a much different situation I would like to retry this in 2 weeks and I discussed this with the patient 08/12/2021; the patient has had some improvement with the Iodoflex. The the area on the left heel plantar more improved than the right. I had to put him in a total contact cast on the left although I decided to put that off for 2 weeks. I am going to change his primary dressing to silver collagen. I think in both areas he has had some improvement most of the healing seems to be more proximal in the heel. The wounds are in the mid aspect. A lot of thick callus on the right heel however. 1/19; we are using silver collagen on both plantar heel areas. He has had some improvement today. The left did not require any debridement. He still had some eschar on the right that was debrided but both seem to have contracted. I did not put it total contact cast on him today 2/2 we have been using silver collagen. The area on the right plantar heel has areas that appear to be epithelialized interspersed with dry flaking callus and dry skin. I removed this. This really looks better than on the other side. On the left still a large area with raised edges and debris on the surface. The patient states he is in the heel offloading boots for a prolonged  period of time and really does not use any other footwear 2/6; patient presents for first cast exchange. He has no issues or complaints today. 2/9; not much change in the left foot wound with 1 week of a cast we are using silver collagen. Silver collagen on the right side. The right side has been the better wound surface. We will reapply the total contact cast on the left 2/16; not much improvement on either side I been using silver collagen with a total contact cast on the left. I'm changing the Hydrofera Blue still with a total contact cast on the left 2/23; some improvement on both sides. Disappointing  that he has thick callus around the area that we are putting in a total contact cast on the left. We've been using Hydrofera Blue on both wound areas. This is a man who at essentially pressure ulcers in addition to ischemia caused by medications to support his blood pressure (pressors) in the ICU. He was being ventilated in the standing position for severe Covid. A [place] the wounds extended across his entire foot but are now localized to his plantar heels bilaterally. We have made progress however neither areas healed. I continue to think the total contact cast is helped albeit painstakingly slowly. He has never wanted a plastic surgery consult although I don't know that they would be interested in grafting in area in this location. 10/07/2021: Continued improvement bilaterally. There is still some callus around the left wound, despite the total contact cast. He has some increased pain in his right midfoot around 1 particular area. This has been painful in the past but seems to be a little bit worse. When his cast was removed today, there was an area on the heel of the left foot that looks a bit macerated. He is also complaining of pain in his left thigh and hip which he thinks is secondary to the limb length discrepancy caused by the cast. 10/14/2021: He continues to improve. A little bit less callus  around the left wound. He continues to endorse pain in his right midfoot, but this is not as significant as it was last week. The maceration on his left heel is improved. 10/21/2021: Continued improvement to both wounds. The maceration on his left heel is no longer evident. Less callus bilaterally. Epithelialization progressing. 10/28/2021: Significant improvement this week. The right sided wound is nearly closed with just a small open area at the middle. No maceration seen on the left heel. Continued epithelialization on both sides. No concern for infection. 11/04/2021: T oday, the wounds were measured a little bit differently and come out as larger, but I actually think they are about the same to potentially even smaller, particularly on the left. He continues to accumulate some callus on the right. 11/11/2021: T oday, the patient is expressing some concern that the left wound, despite being in the total contact cast, is not progressing at the same rate as the 1 on the right. He is interested in trying a week without the cast to see how the wound does. The wounds are roughly the same size as last week, with the right perhaps being a little bit smaller. He continues to build up callus on both sites. 11/18/2021: Last week, I permitted the patient to go without his total contact cast, just to see if the cast was really making any difference. Today, both wounds have deteriorated to some extent, suggesting that the cast is providing benefit, at least on the left. Both are larger and have accumulated callus, slough, and other debris. 11/26/2021: I debrided both wounds quite aggressively last week in an effort to stimulate the healing cascade. This appears to have been effective as the left sided wound is a full centimeter shorter in length. Although the right was measured slightly larger, on inspection, it looks as though an area of epithelialized tissue was included in the measurements. We have been using  PolyMem Ag on the wound surfaces with a total contact cast to the left. 12/02/2021: It appears that the intake personnel are including epithelialized tissue in his wound measurements; the right wound is almost completely epithelialized; there is just  a crater at the proximal midfoot with some open areas. On the left, he has built up some callus, but the overall wound surface looks good. There is some senescent skin around the wound margin. He has been in PolyMem Ag bilaterally with a total contact cast on the left. 12/09/2021: The right wound is nearly closed; there is just a small open area at the mid calcaneus. On the left, the wound is smaller with minimal callus buildup. No significant drainage. 12/16/2021: The right calcaneal wound remains minimally open at the mid calcaneus; the rest has epithelialized. On the left, the wound is also a little bit smaller. There is some senescent tissue on the periphery. He is getting his first application of a trial skin substitute called Vendaje today. 12/23/2021: The wound on his right calcaneus is nearly closed; there is just a small area at the most distal aspect of the calcaneus that is open. On the left, the area where we applied to the skin substitute has a healthier-looking bed of granulation tissue. The wound dimensions are not significantly different on this side but the wound surface is improved. 12/30/2021: The wound on the right calcaneus has not changed significantly aside from some accumulation of callus. On the left, the open area is smaller and continues to have an improved surface. He continues to accumulate callus around the wound. He is here for his third application of Vendaje. 01/06/2022: The right calcaneal wound is down to just a couple of millimeters. He continues to accumulate periwound callus. He unfortunately got his cast wet earlier in the week and his left foot is macerated, resulting in some superficial skin loss just distal to the open  wound. The open wound itself, however, is much smaller and has a healthier appearing surface. He is here for his fourth application of Vendaje. 01/13/2022: The right calcaneal wound is about the same. Unfortunately, once again, his cast got wet and his foot is again macerated. This is caused the left calcaneal wound to enlarge. He is here for his fifth application of Vendaje. 01/20/2022: The right calcaneal wound is very small. There is some periwound callus accumulation. He purchased a new cast protector last week and this has been effective in avoiding the maceration that has been occurring on the left. The left calcaneal wound is narrower and has a healthy and viable-appearing surface. He is here for his 6 application of Vendaje. 01/27/2022: The right calcaneal wound is down to just a pinhole. There is some periwound slough and callus. On the left, the wound is narrower and shorter by about a centimeter. The surface is robust and viable-appearing. Unfortunately, the rep for the trial skin substitute product did not provide any for [place] to use today. 02/04/2022: The right calcaneal wound remains unchanged. There is more accumulated callus. On the left, although the intake nurse measured it a little bit longer, it looks about the same to me. The surface has a layer of slough, but underneath this, there is good granulation tissue. 02/10/2022: The right calcaneus wound is nearly closed. There is still some callus that builds up around the site. The left side looks about the same in terms of dimensions, but the surface is more robust and vital-appearing. 02/16/2022: The area of the right calcaneus that was nearly closed last week has closed, but there is a small opening at the mid foot where it looks like some moisture got retained and caused some reopening. The left foot wound is narrower and shallower. Both sites  have a fair amount of periwound callus and eschar. 02/24/2022: The small midfoot opening on the  right calcaneus is a little bit smaller today. The left foot wound is narrower and shallower. He continues to accumulate periwound callus. No concern for infection. Electronic Signature(s) Signed: 02/24/2022 10:19:53 AM By: Fredirick Maudlin MD FACS Entered By: Fredirick Maudlin on 02/24/2022 10:19:53 -------------------------------------------------------------------------------- Physical Exam Details Patient Name: Date of Service: [name], [place] 02/24/2022 9:30 A M Medical Record Number: [id number] Patient Account Number: [id number] Date of Birth/Sex: Treating RN: [birth year] (46 y.o. [name] Primary Care Provider: [name] Other Clinician: Referring Provider: Treating Provider/Extender: [name] in Treatment: 58 Constitutional Hypertensive, asymptomatic. . . . No acute distress.Marland Kitchen Respiratory Normal work of breathing on room air.. Notes 02/24/2022: The small midfoot opening on the right calcaneus is a little bit smaller today. The left foot wound is narrower and shallower. He continues to accumulate periwound callus. No concern for infection. Electronic Signature(s) Signed: 02/24/2022 10:20:22 AM By: Fredirick Maudlin MD FACS Entered By: Fredirick Maudlin on 02/24/2022 10:20:22 -------------------------------------------------------------------------------- Physician Orders Details Patient Name: Date of Service: [name], [place] 02/24/2022 9:30 A M Medical Record Number: [id number] Patient Account Number: [id number] Date of Birth/Sex: Treating RN: 1976/03/12 (46 y.o. [name] Primary Care Provider: [name] Other Clinician: Referring Provider: Treating Provider/Extender: [name] in Treatment: 65 Verbal / Phone Orders: No Diagnosis Coding Follow-up Appointments ppointment in 1 week. - Dr [name] - Room 3 Thursday July 27th at 9:30am Return A Cellular or Tissue Based Products Cellular or Tissue Based  Product Type: - Vendaje #9 (trial) ( Applied to Right and Left heels) Cellular or Tissue Based Product applied to wound bed, secured with steri-strips, cover with A daptic or Mepitel. (DO NOT REMOVE). Bathing/ Shower/ Hygiene May shower with protection but do not get wound dressing(s) wet. - Cover with cast protector (can purchase cast protector at [facility] or [facility] ) Edema Control - Lymphedema / SCD / Other Bilateral Lower Extremities Elevate legs to the level of the heart or above for 30 minutes daily and/or when sitting, a frequency of: - throughout the day Avoid standing for long periods of time. Moisturize legs daily. - right leg and foot every night. Off-Loading Total Contact Cast to Left Lower Extremity Wedge shoe to: - Globoped Shoe to right foot and left foot when not in TCC Other: - keep pressure off of the bottom of your feet Additional Orders / Instructions Follow Nutritious Diet Wound Treatment Wound #1 - Calcaneus Wound Laterality: Right Cleanser: Normal Saline (Generic) Every Other Day/30 Days Discharge Instructions: Cleanse the wound with Normal Saline prior to applying a clean dressing using gauze sponges, not tissue or cotton balls. Cleanser: Wound Cleanser (Generic) Every Other Day/30 Days Discharge Instructions: Cleanse the wound with wound cleanser prior to applying a clean dressing using gauze sponges, not tissue or cotton balls. Prim Dressing: VENDAJE ary Every Other Day/30 Days Secondary Dressing: Zetuvit Plus 4x8 in (Generic) Every Other Day/30 Days Discharge Instructions: Apply over primary dressing as directed. Secured With: Elastic Bandage 4 inch (ACE bandage) (Generic) Every Other Day/30 Days Discharge Instructions: Secure with ACE bandage as directed. Secured With: [employer], 4.5x3.1 (in/yd) (Generic) Every Other Day/30 Days Discharge Instructions: Secure with Kerlix as directed. Wound #2 - Calcaneus Wound Laterality: Left Cleanser: Normal Saline  (Generic) 1 x Per Week/30 Days Discharge Instructions: Cleanse the wound with Normal Saline prior to applying a clean  dressing using gauze sponges, not tissue or cotton balls. Cleanser: Wound Cleanser 1 x Per Week/30 Days Discharge Instructions: Cleanse the wound with wound cleanser prior to applying a clean dressing using gauze sponges, not tissue or cotton balls. Peri-Wound Care: Zinc Oxide Ointment 30g tube 1 x Per Week/30 Days Discharge Instructions: Apply Zinc Oxide to periwound with each dressing change Prim Dressing: VENDAJE ary 1 x Per Week/30 Days Secondary Dressing: Optifoam Non-Adhesive Dressing, 4x4 in (Generic) 1 x Per Week/30 Days Discharge Instructions: Apply over primary dressing as directed. Secondary Dressing: Zetuvit Plus 4x8 in (Generic) 1 x Per Week/30 Days Discharge Instructions: Apply over primary dressing as directed. Secured With: [employer], 4.5x3.1 (in/yd) (Generic) 1 x Per Week/30 Days Discharge Instructions: Secure with Kerlix as directed. Secured With: 47M Medipore H Soft Cloth Surgical T ape, 4 x 10 (in/yd) (Generic) 1 x Per Week/30 Days Discharge Instructions: Secure with tape as directed. Electronic Signature(s) Signed: 02/24/2022 12:47:40 PM By: Fredirick Maudlin MD FACS Signed: 02/24/2022 5:36:35 PM By: Sharyn Creamer RN, BSN Entered By: Sharyn Creamer on 02/24/2022 10:41:15 -------------------------------------------------------------------------------- Problem List Details Patient Name: Date of Service: [name], [place] 02/24/2022 9:30 A M Medical Record Number: [id number] Patient Account Number: [id number] Date of Birth/Sex: Treating RN: 03-Aug-1976 (46 y.o. [name] Primary Care Provider: [name] Other Clinician: Referring Provider: Treating Provider/Extender: [name] in Treatment: 75 Active Problems ICD-10 Encounter Code Description Active Date MDM Diagnosis L97.512 Non-pressure chronic ulcer  of other part of right foot with fat layer exposed 09/03/2020 No Yes L97.522 Non-pressure chronic ulcer of other part of left foot with fat layer exposed 09/03/2020 No Yes Inactive Problems ICD-10 Code Description Active Date Inactive Date L89.893 Pressure ulcer of other site, stage 3 07/15/2020 07/15/2020 M62.81 Muscle weakness (generalized) 07/15/2020 07/15/2020 I10 Essential (primary) hypertension 07/15/2020 07/15/2020 M86.171 Other acute osteomyelitis, right ankle and foot 09/03/2020 09/03/2020 Resolved Problems Electronic Signature(s) Signed: 02/24/2022 10:18:12 AM By: Fredirick Maudlin MD FACS Entered By: Fredirick Maudlin on 02/24/2022 10:18:12 -------------------------------------------------------------------------------- Progress Note Details Patient Name: Date of Service: [name], [place] 02/24/2022 9:30 A M Medical Record Number: [id number] Patient Account Number: [id number] Date of Birth/Sex: Treating RN: 10/23/1975 (46 y.o. [name] Primary Care Provider: [name] Other Clinician: Referring Provider: Treating Provider/Extender: [name] in Treatment: 58 Subjective Chief Complaint Information obtained from Patient Bilateral Plantar Foot Ulcers History of Present Illness (HPI) Wounds are12/03/2020 upon evaluation today patient presents for initial inspection here in our clinic concerning issues he has been having with the bottoms of his feet bilaterally. He states these actually occurred as wounds when he was hospitalized for 5 months secondary to Covid. He was apparently with tilting bed where he was in an upright position quite frequently and apparently this occurred in some way shape or form during that time. Fortunately there is no sign of active infection at this time. No fevers, chills, nausea, vomiting, or diarrhea. With that being said he still has substantial wounds on the plantar aspects of his feet Theragen require quite a bit of  work to get these to heal. He has been using Santyl currently though that is been problematic both in receiving the medication as well as actually paid for it as it is become quite expensive. Prior to the experience with Covid the patient really did not have any major medical problems other than hypertension he does have some mild generalized weakness following the Covid experience. 07/22/2020 on  evaluation today patient appears to be doing okay in regard to his foot ulcers I feel like the wound beds are showing signs of better improvement that I do believe the Iodoflex is helping in this regard. With that being said he does have a lot of drainage currently and this is somewhat blue/green in nature which is consistent with Pseudomonas. I do think a culture today would be appropriate for [place] to evaluate and see if that is indeed the case I would likely start him on antibiotic orally as well he is not allergic to Cipro knows of no issues he has had in the past 12/21; patient was admitted to the clinic earlier this month with bilateral presumed pressure ulcers on the bottom of his feet apparently related to excessive pressure from a tilt table arrangement in the intensive care unit. Patient relates this to being on ECMO but I am not really sure that is exactly related to that. I must say I have never seen anything like this. He has fairly extensive full-thickness wounds extending from his heel towards his midfoot mostly centered laterally. There is already been some healing distally. He does not appear to have an arterial issue. He has been using gentamicin to the wound surfaces with Iodoflex to help with ongoing debridement 1/6; this is a patient with pressure ulcers on the bottom of his feet related to excessive pressure from a standing position in the intensive care unit. He is complaining of a lot of pain in the right heel. He is not a diabetic. 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 We have been using Iodoflex to help prepare the surfaces of both wounds for an advanced treatment product. He is nonambulatory spending most of his time in a wheelchair I have asked him not to propel the wheelchair with his heels 1/13; in general his wounds look better not much surface area change we have been using Iodoflex as of last week. I did an x-ray of the right heel as the patient was complaining of pain. I had some thoughts about a stress fracture perhaps Achilles tendon problems however what it showed was erosive changes along the inferior aspect of the calcaneus he now has a MRI booked for 1/20. 1/20; in general his wounds continue to be better. Some improvement in the large narrow areas proximally in his foot. He is still complaining of pain in the right heel and tenderness in certain areas of this wound. His MRI is tonight. I am not just looking for osteomyelitis that was brought up on the x-ray I am wondering about stress fractures, tendon ruptures etc. He has no such findings on the left. Also noteworthy is that the patient had critical illness neuropathy and some of the discomfort may be actual improvement in nerve function I am just not sure. These wounds were initially in the setting of severe critical illness related to COVID-19. He was put in a standing position. He may have also been on pressors at the point contributing to tissue ischemia. By his description at some point these wounds were grossly necrotic extending proximally up into the Achilles part of his heel. I do not know that I have ever really seen pictures of them like this although they may exist in epic We have ordered Tri layer Oasis. I am trying to stimulate some granulation in these areas. This is of course assuming the MRI is negative for infection 1/27; since the patient was last here he saw  Dr. Juleen China of infectious disease. He is planned for vancomycin and ceftriaxone. Prior operative culture  grew MSSA. Also ordered baseline lab work. He also ordered arterial studies although the ABIs in our clinic were normal as well as his clinical exam these were normal I do not think he needs to see vascular surgery. His ABIs at the PTA were 1.22 in the right triphasic waveforms with a normal TBI of 1.15 on the left ABI of 1.22 with triphasic waveforms and a normal TBI of 1.08. Finally he saw Dr. Amalia Hailey who will follow him in for 2 months. At this point I do not think he felt that he needed a procedure on the right calcaneal bone. Dr. Juleen China is elected for broad-spectrum antibiotic The patient is still having pain in the right heel. He walks with a walker 2/3; wounds are generally smaller. He is tolerating his IV antibiotics. I believe this is vancomycin and ceftriaxone. We are still waiting for Oasis burn in terms of his out-of-pocket max which he should be meeting soon given the IV antibiotics, MRIs etc. I have asked him to check in on this. We are using silver collagen in the meantime the wounds look better 2/10; tolerating IV vancomycin and Rocephin. We are waiting to apply for Oasis. Although I am not really sure where he is in his out-of-pocket max. 2/17 started the first application of Oasis trilayer. Still on antibiotics. The wounds have generally look better. The area on the left has a little more surface slough requiring debridement [id number]; second application of Oasis trilayer. The wound surface granulation is generally look better. The area on the left with undermining laterally I think is come in a bit. 10/08/2020 upon evaluation today patient is here today for [employer] application #3. Fortunately he seems to be doing extremely well with regard to this and we are seeing a lot of new epithelial growth which is great news. Fortunately there is no signs of active infection at this time. 10/16/2020 upon evaluation today patient appears to be doing well with regard to his foot ulcers. Do  believe the Oasis has been of benefit for him. I do not see any signs of infection right now which is great news and I think that he has a lot of new epithelial growth which is great to see as well. The patient is very pleased to hear all of this. I do think we can proceed with the Oasis trilayer #4 today. 3/18; not as much improvement in these areas on his heels that I was hoping. I did reapply trilateral Oasis today the tissue looks healthier but not as much fill in as I was hoping. 3/25; better looking today I think this is come in a bit the tissue looks healthier. Triple layer Oasis reapplied #6 4/1; somewhat better looking definitely better looking surface not as much change in surface area as I was hoping. He may be spending more time [place] on days then he needs to although he does have heel offloading boots. Triple layer Oasis reapplied #7 4/7; unfortunately apparently [facility] will not approve any further Oasis which is unfortunate since the patient did respond nicely both in terms of the condition of the wound bed as well as surface area. There is still some drainage coming from the wound but not a lot there does not appear to be any infection 4/15; we have been using Hydrofera Blue. He continues to have nice rims of epithelialization on the right greater  than the left. The left the epithelialization is coming from the tip of his heel. There is moderate drainage. In this that concerns me about a total contact cast. There is no evidence of infection 4/29; patient has been using Hydrofera Blue with dressing changes. He has no complaints or issues today. 5/5; using Hydrofera Blue. I actually think that he looks marginally better than the last time I saw this 3 weeks ago. There are rims of epithelialization on the left thumb coming from the medial side on the right. Using Hydrofera Blue 5/12; using Hydrofera Blue. These continue to make improvements in surface area. His drainage  was not listed as severe I therefore went ahead and put a cast on the left foot. Right foot we will continue to dress his previous 5/16; back for first total contact cast change. He did not tolerate this particularly well cast injury on the anterior tibia among other issues. Difficulty sleeping. I talked him about this in some detail and afterwards is elected to continue. I told him I would like to have a cast on for 3 weeks to see if this is going to help at all. I think he agreed 5/19; I think the wound is better. There is no tunneling towards his midfoot. The undermining medially also looks better. He has a rim of new skin distally. I think we are making progress here. The area on the left also continues to look somewhat better to me using Hydrofera Blue. He has a list of complaints about the cast but none of them seem serious 5/26; patient presents for 1 week follow-up. He has been using a total contact cast and tolerating this well. Hydrofera Blue is the main dressing used. He denies signs of infection. 6/2 Hydrofera Blue total contact cast on the left. These were large ulcers that formed in intensive care unit where the patient was recovering from [place]. May have had something to do with being ventilated in an upright positiono Pressors etc. We have been able to get the areas down considerably and a viable surface. There is some epithelialization in both sides. Note made of drainage 6/9; changed to [facility] last time because of drainage. He arrives with better looking surfaces and dimensions on the left than the right. Paradoxically the right actually probes more towards his midfoot the left is largely close down but both of these look improved. Using a total contact cast on the left 6/16; complex wounds on his bilateral plantar heels which were initially pressure injury from a stay in the ICU with COVID. We have been using silver alginate most recently. His dimensions of come in quite  dramatically however not recently. We have been putting the left foot in a total contact cast 6/23; complex wounds on the bilateral plantar heels. I been putting the left in the cast paradoxically the area on the right is the one that is going towards closure at a faster rate. Quite a bit of drainage on the left. The patient went to see Dr. Amalia Hailey who said he was going to standby for skin grafts. I had actually considered sending him for skin grafts however he would be mandatorily off his feet for a period of weeks to months. I am thinking that the area on the right is going to close on its own the area on the left has been more stubborn even though we have him in a total contact cast 6/30; took him out of a total contact cast last week  is the right heel seem to be making better progress than the left where I was placing the cast. We are using silver alginate. Both wounds are smaller right greater than left 7/12; both wounds look as though they are making some progress. We are using silver alginate. Heel offloading boots 7/26; very gradual progress especially on the right. Using silver alginate. He is wearing heel offloading boots 8/18; he continues to close these wounds down very gradually. Using silver alginate. The problem polymen being definitive about this is areas of what appears to be callus around the margins. This is not a 100% of the area but certainly sizable especially on the right 9/1; bilateral plantar feet wounds secondary to prolonged pressure while being ventilated for COVID-19 in an upright position. Essentially pressure ulcers on the bottom of his feet. He is made substantial progress using silver alginate. 9/14; bilateral plantar feet wounds secondary to prolonged pressure. Making progress using silver alginate. 9/29 bilateral plantar feet wounds secondary to prolonged pressure. I changed him to Iodoflex last week. MolecuLight showing reddened blush fluorescence 10/11; patient  presents for follow-up. He has no issues or complaints today. He denies signs of infection. He continues to use Iodoflex and antibiotic ointment to the wound beds. 10/27; 2-week follow-up. No evidence of infection. He has callus and thick dry skin around the wound margins we have been using Iodoflex and Bactroban which was in response to a moderate left MolecuLight reddish blush fluorescence. 11/10; 2-week follow-up. Wound margins again have thick callus however the measurements of the actual wound sites are a lot smaller. Everything looks reasonably healthy here. We have been using Iodoflex He was approved for prime matrix but I have elected to delay this given the improvement in the surface area. Hopefully I will not regret that decision as were getting close to the end of the year in terms of insurance payment 12/8; 2-week follow-up. Wounds are generally smaller in size. These were initially substantial wounds extending into the forefoot all the way into the heel on the bilateral plantar feet. They are now both located on the plantar heel distal aspect both of these have a lot of callus around the wounds I used a #5 curette to remove this on the right and the left also some subcutaneous debris to try and get the wound edges were using Iodoflex. He has heel offloading shoe 12/22; 2-week follow-up. Not really much improvement. He has thick callus around the outer edges of both wounds. I remove this there is some nonviable subcutaneous tissue as well. We have been using Iodoflex. Her intake nurse and myself spontaneously thought of a total contact cast I went back in May. At that time we really were not seeing much of an improvement with a cast although the wound was in a much different situation I would like to retry this in 2 weeks and I discussed this with the patient 08/12/2021; the patient has had some improvement with the Iodoflex. The the area on the left heel plantar more improved than the  right. I had to put him in a total contact cast on the left although I decided to put that off for 2 weeks. I am going to change his primary dressing to silver collagen. I think in both areas he has had some improvement most of the healing seems to be more proximal in the heel. The wounds are in the mid aspect. A lot of thick callus on the right heel however. 1/19; we are using  silver collagen on both plantar heel areas. He has had some improvement today. The left did not require any debridement. He still had some eschar on the right that was debrided but both seem to have contracted. I did not put it total contact cast on him today 2/2 we have been using silver collagen. The area on the right plantar heel has areas that appear to be epithelialized interspersed with dry flaking callus and dry skin. I removed this. This really looks better than on the other side. On the left still a large area with raised edges and debris on the surface. 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 He has no issues or complaints today. 2/9; not much change in the left foot wound with 1 week of a cast we are using silver collagen. Silver collagen on the right side. The right side has been the better wound surface. We will reapply the total contact cast on the left 2/16; not much improvement on either side I been using silver collagen with a total contact cast on the left. I'm changing the Hydrofera Blue still with a total contact cast on the left 2/23; some improvement on both sides. Disappointing that he has thick callus around the area that we are putting in a total contact cast on the left. We've been using Hydrofera Blue on both wound areas. This is a man who at essentially pressure ulcers in addition to ischemia caused by medications to support his blood pressure (pressors) in the ICU.  He was being ventilated in the standing position for severe Covid. A Shiley the wounds extended across his entire foot but are now localized to his plantar heels bilaterally. We have made progress however neither areas healed. I continue to think the total contact cast is helped albeit painstakingly slowly. He has never wanted a plastic surgery consult although I don't know that they would be interested in grafting in area in this location. 10/07/2021: Continued improvement bilaterally. There is still some callus around the left wound, despite the total contact cast. He has some increased pain in his right midfoot around 1 particular area. This has been painful in the past but seems to be a little bit worse. When his cast was removed today, there was an area on the heel of the left foot that looks a bit macerated. He is also complaining of pain in his left thigh and hip which he thinks is secondary to the limb length discrepancy caused by the cast. 10/14/2021: He continues to improve. A little bit less callus around the left wound. He continues to endorse pain in his right midfoot, but this is not as significant as it was last week. The maceration on his left heel is improved. 10/21/2021: Continued improvement to both wounds. The maceration on his left heel is no longer evident. Less callus bilaterally. Epithelialization progressing. 10/28/2021: Significant improvement this week. The right sided wound is nearly closed with just a small open area at the middle. No maceration seen on the left heel. Continued epithelialization on both sides. No concern for infection. 11/04/2021: T oday, the wounds were measured a little bit differently and come out as larger, but I actually think they are about the same to potentially even smaller, particularly on the left. He continues to accumulate some callus on the right. 11/11/2021: T oday, the patient is expressing  some concern that the left wound, despite being in the  total contact cast, is not progressing at the same rate as the 1 on the right. He is interested in trying a week without the cast to see how the wound does. The wounds are roughly the same size as last week, with the right perhaps being a little bit smaller. He continues to build up callus on both sites. 11/18/2021: Last week, I permitted the patient to go without his total contact cast, just to see if the cast was really making any difference. Today, both wounds have deteriorated to some extent, suggesting that the cast is providing benefit, at least on the left. Both are larger and have accumulated callus, slough, and other debris. 11/26/2021: I debrided both wounds quite aggressively last week in an effort to stimulate the healing cascade. This appears to have been effective as the left sided wound is a full centimeter shorter in length. Although the right was measured slightly larger, on inspection, it looks as though an area of epithelialized tissue was included in the measurements. We have been using PolyMem Ag on the wound surfaces with a total contact cast to the left. 12/02/2021: It appears that the intake personnel are including epithelialized tissue in his wound measurements; the right wound is almost completely epithelialized; there is just a crater at the proximal midfoot with some open areas. On the left, he has built up some callus, but the overall wound surface looks good. There is some senescent skin around the wound margin. He has been in PolyMem Ag bilaterally with a total contact cast on the left. 12/09/2021: The right wound is nearly closed; there is just a small open area at the mid calcaneus. On the left, the wound is smaller with minimal callus buildup. No significant drainage. 12/16/2021: The right calcaneal wound remains minimally open at the mid calcaneus; the rest has epithelialized. On the left, the wound is also a little bit smaller. There is some senescent tissue on the  periphery. He is getting his first application of a trial skin substitute called Vendaje today. 12/23/2021: The wound on his right calcaneus is nearly closed; there is just a small area at the most distal aspect of the calcaneus that is open. On the left, the area where we applied to the skin substitute has a healthier-looking bed of granulation tissue. The wound dimensions are not significantly different on this side but the wound surface is improved. 12/30/2021: The wound on the right calcaneus has not changed significantly aside from some accumulation of callus. On the left, the open area is smaller and continues to have an improved surface. He continues to accumulate callus around the wound. He is here for his third application of Vendaje. 01/06/2022: The right calcaneal wound is down to just a couple of millimeters. He continues to accumulate periwound callus. He unfortunately got his cast wet earlier in the week and his left foot is macerated, resulting in some superficial skin loss just distal to the open wound. The open wound itself, however, is much smaller and has a healthier appearing surface. He is here for his fourth application of Vendaje. 01/13/2022: The right calcaneal wound is about the same. Unfortunately, once again, his cast got wet and his foot is again macerated. This is caused the left calcaneal wound to enlarge. He is here for his fifth application of Vendaje. 01/20/2022: The right calcaneal wound is very small. There is some periwound callus accumulation. He purchased a new cast  protector last week and this has been effective in avoiding the maceration that has been occurring on the left. The left calcaneal wound is narrower and has a healthy and viable-appearing surface. He is here for his 6 application of Vendaje. 01/27/2022: The right calcaneal wound is down to just a pinhole. There is some periwound slough and callus. On the left, the wound is narrower and shorter by about a  centimeter. The surface is robust and viable-appearing. Unfortunately, the rep for the trial skin substitute product did not provide any for [place] to use today. 02/04/2022: The right calcaneal wound remains unchanged. There is more accumulated callus. On the left, although the intake nurse measured it a little bit longer, it looks about the same to me. The surface has a layer of slough, but underneath this, there is good granulation tissue. 02/10/2022: The right calcaneus wound is nearly closed. There is still some callus that builds up around the site. The left side looks about the same in terms of dimensions, but the surface is more robust and vital-appearing. 02/16/2022: The area of the right calcaneus that was nearly closed last week has closed, but there is a small opening at the mid foot where it looks like some moisture got retained and caused some reopening. The left foot wound is narrower and shallower. Both sites have a fair amount of periwound callus and eschar. 02/24/2022: The small midfoot opening on the right calcaneus is a little bit smaller today. The left foot wound is narrower and shallower. He continues to accumulate periwound callus. No concern for infection. Patient History Information obtained from Patient. Family History Cancer - Maternal Grandparents, Diabetes - Father,Paternal Grandparents, Heart Disease - Maternal Grandparents, Hypertension - Father,Paternal Grandparents, Lung Disease - Siblings, No family history of Hereditary Spherocytosis, Kidney Disease, Seizures, Stroke, Thyroid Problems, Tuberculosis. Social History Never smoker, Marital Status - Married, Alcohol Use - Never, Drug Use - No History, Caffeine Use - Daily - tea, soda. Medical History Eyes Denies history of Cataracts, Glaucoma, Optic Neuritis Ear/Nose/Mouth/Throat Denies history of Chronic sinus problems/congestion, Middle ear problems Hematologic/Lymphatic Denies history of Anemia, Hemophilia, Human  Immunodeficiency Virus, Lymphedema, Sickle Cell Disease Respiratory Patient has history of Asthma Denies history of Aspiration, Chronic Obstructive Pulmonary Disease (COPD), Pneumothorax, Sleep Apnea, Tuberculosis Cardiovascular Patient has history of Angina - with COVID, Hypertension Denies history of Arrhythmia, Congestive Heart Failure, Coronary Artery Disease, Deep Vein Thrombosis, Hypotension, Myocardial Infarction, Peripheral Arterial Disease, Peripheral Venous Disease, Phlebitis, Vasculitis Gastrointestinal Denies history of Cirrhosis , Colitis, Crohnoos, Hepatitis A, Hepatitis B, Hepatitis C Endocrine Denies history of Type I Diabetes, Type II Diabetes Genitourinary Denies history of End Stage Renal Disease Immunological Denies history of Lupus Erythematosus, Raynaudoos, Scleroderma Integumentary (Skin) Denies history of History of Burn Musculoskeletal Denies history of Gout, Rheumatoid Arthritis, Osteoarthritis, Osteomyelitis Neurologic Denies history of Dementia, Neuropathy, Quadriplegia, Paraplegia, Seizure Disorder Oncologic Denies history of Received Chemotherapy, Received Radiation Psychiatric Denies history of Anorexia/bulimia, Confinement Anxiety Hospitalization/Surgery History - COVID PNA 07/22/2019- 11/14/2019. - 03/27/2020 wound debridement/ skin graft. Medical A Surgical History Notes nd Constitutional Symptoms (General Health) COVID PNA 07/22/2019-11/14/2019 [facility], foot drop left foot , Genitourinary kidney stone Psychiatric anxiety Objective Constitutional Hypertensive, asymptomatic. No acute distress.. Vitals Time Taken: 9:30 AM, Height: 69 in, Weight: 280 lbs, BMI: 41.3, Temperature: 99.2 F, Pulse: 90 bpm, Respiratory Rate: 18 breaths/min, Blood Pressure: 158/76 mmHg. Respiratory Normal work of breathing on room air.. General Notes: 02/24/2022: The small midfoot opening on the right calcaneus is a little  bit smaller today. The left foot wound is  narrower and shallower. He continues to accumulate periwound callus. No concern for infection. Integumentary (Hair, Skin) Wound #1 status is Open. Original cause of wound was Pressure Injury. The date acquired was: 10/07/2019. The wound has been in treatment 84 weeks. The wound is located on the Right Calcaneus. The wound measures 0.5cm length x 0.5cm width x 0.1cm depth; 0.196cm^2 area and 0.02cm^3 volume. There is Fat Layer (Subcutaneous Tissue) exposed. There is no tunneling or undermining noted. There is a medium amount of serosanguineous drainage noted. The wound margin is thickened. There is small (1-33%) pink granulation within the wound bed. There is a large (67-100%) amount of necrotic tissue within the wound bed including Eschar and Adherent Slough. Wound #2 status is Open. Original cause of wound was Pressure Injury. The date acquired was: 10/07/2019. The wound has been in treatment 84 weeks. The wound is located on the Left Calcaneus. The wound measures 2.6cm length x 1.2cm width x 0.2cm depth; 2.45cm^2 area and 0.49cm^3 volume. There is Fat Layer (Subcutaneous Tissue) exposed. There is no tunneling or undermining noted. There is a medium amount of serosanguineous drainage noted. The wound margin is distinct with the outline attached to the wound base. There is medium (34-66%) red granulation within the wound bed. There is a medium (34-66%) amount of necrotic tissue within the wound bed including Adherent Slough. Assessment Active Problems ICD-10 Non-pressure chronic ulcer of other part of right foot with fat layer exposed Non-pressure chronic ulcer of other part of left foot with fat layer exposed Procedures Wound #1 Pre-procedure diagnosis of Wound #1 is a Pressure Ulcer located on the Right Calcaneus . There was a Selective/Open Wound Non-Viable Tissue Debridement with a total area of 0.25 sq cm performed by Fredirick Maudlin, MD. With the following instrument(s): Curette to remove  Non-Viable tissue/material. Material removed includes [facility] after achieving pain control using Lidocaine 5% topical ointment. No specimens were taken. A time out was conducted at 09:56, prior to the start of the procedure. A Minimum amount of bleeding was controlled with Pressure. The procedure was tolerated well with a pain level of 0 throughout and a pain level of 0 following the procedure. Post Debridement Measurements: 0.5cm length x 0.5cm width x 0.1cm depth; 0.02cm^3 volume. Post debridement Stage noted as Category/Stage III. Character of Wound/Ulcer Post Debridement is improved. Post procedure Diagnosis Wound #1: Same as Pre-Procedure Pre-procedure diagnosis of Wound #1 is a Pressure Ulcer located on the Right Calcaneus. A skin graft procedure using a bioengineered skin substitute/cellular or tissue based product was performed by Fredirick Maudlin, MD with the following instrument(s): Forceps and Scissors. Other was applied and secured with Steri-Strips. 8 sq cm of product was utilized and 0 sq cm was wasted. Post Application, adaptic was applied. A Time Out was conducted at 09:56, prior to the start of the procedure. The procedure was tolerated well with a pain level of 0 throughout and a pain level of 0 following the procedure. Post procedure Diagnosis Wound #1: Same as Pre-Procedure General Notes: Trial**** Vendaje. Wound #2 Pre-procedure diagnosis of Wound #2 is a Pressure Ulcer located on the Left Calcaneus. A skin graft procedure using a bioengineered skin substitute/cellular or tissue based product was performed by Fredirick Maudlin, MD with the following instrument(s): Forceps and Scissors. Other was applied and secured with Steri-Strips. 8 sq cm of product was utilized and 0 sq cm was wasted. Post Application, adaptic was applied. A Time Out was conducted  at 09:56, prior to the start of the procedure. The procedure was tolerated well with a pain level of 0 throughout and a pain level  of 0 following the procedure. Post procedure Diagnosis Wound #2: Same as Pre-Procedure . Pre-procedure diagnosis of Wound #2 is a Pressure Ulcer located on the Left Calcaneus . There was a T Contact Cast Procedure by Fredirick Maudlin, MD. otal Post procedure Diagnosis Wound #2: Same as Pre-Procedure Plan Follow-up Appointments: Return Appointment in 1 week. - Dr [name] - Room 3 Thursday July 27th at 9:30am Cellular or Tissue Based Products: Cellular or Tissue Based Product Type: - Vendaje #9 (trial) ( Applied to Right and Left heels) Cellular or Tissue Based Product applied to wound bed, secured with steri-strips, cover with Adaptic or Mepitel. (DO NOT REMOVE). Bathing/ Shower/ Hygiene: May shower with protection but do not get wound dressing(s) wet. - Cover with cast protector (can purchase cast protector at [facility] or [facility] ) Edema Control - Lymphedema / SCD / Other: Elevate legs to the level of the heart or above for 30 minutes daily and/or when sitting, a frequency of: - throughout the day Avoid standing for long periods of time. Moisturize legs daily. - right leg and foot every night. Off-Loading: T Contact Cast to Left Lower Extremity otal Wedge shoe to: - Globoped Shoe to right foot and left foot when not in TCC Other: - keep pressure off of the bottom of your feet Additional Orders / Instructions: Follow Nutritious Diet WOUND #1: - Calcaneus Wound Laterality: Right Cleanser: Normal Saline (Generic) Every Other Day/30 Days Discharge Instructions: Cleanse the wound with Normal Saline prior to applying a clean dressing using gauze sponges, not tissue or cotton balls. Cleanser: Wound Cleanser (Generic) Every Other Day/30 Days Discharge Instructions: Cleanse the wound with wound cleanser prior to applying a clean dressing using gauze sponges, not tissue or cotton balls. Prim Dressing: VENDAJE Every Other Day/30 Days ary Secondary Dressing: Zetuvit Plus 4x8 in (Generic) Every Other  Day/30 Days Discharge Instructions: Apply over primary dressing as directed. Secured With: Elastic Bandage 4 inch (ACE bandage) (Generic) Every Other Day/30 Days Discharge Instructions: Secure with ACE bandage as directed. Secured With: [employer], 4.5x3.1 (in/yd) (Generic) Every Other Day/30 Days Discharge Instructions: Secure with Kerlix as directed. WOUND #2: - Calcaneus Wound Laterality: Left Cleanser: Normal Saline (Generic) 1 x Per Week/30 Days Discharge Instructions: Cleanse the wound with Normal Saline prior to applying a clean dressing using gauze sponges, not tissue or cotton balls. Cleanser: Wound Cleanser 1 x Per Week/30 Days Discharge Instructions: Cleanse the wound with wound cleanser prior to applying a clean dressing using gauze sponges, not tissue or cotton balls. Peri-Wound Care: Zinc Oxide Ointment 30g tube 1 x Per Week/30 Days Discharge Instructions: Apply Zinc Oxide to periwound with each dressing change Prim Dressing: VENDAJE 1 x Per Week/30 Days ary Secondary Dressing: Optifoam Non-Adhesive Dressing, 4x4 in (Generic) 1 x Per Week/30 Days Discharge Instructions: Apply over primary dressing as directed. Secondary Dressing: Zetuvit Plus 4x8 in (Generic) 1 x Per Week/30 Days Discharge Instructions: Apply over primary dressing as directed. Secured With: [employer], 4.5x3.1 (in/yd) (Generic) 1 x Per Week/30 Days Discharge Instructions: Secure with Kerlix as directed. Secured With: 51M Medipore H Soft Cloth Surgical T ape, 4 x 10 (in/yd) (Generic) 1 x Per Week/30 Days Discharge Instructions: Secure with tape as directed. 02/24/2022: The small midfoot opening on the right calcaneus is a little bit smaller today. The left foot wound is narrower  and shallower. He continues to accumulate periwound callus. No concern for infection. I used a curette to debride some slough and nonviable subcutaneous tissue as well as periwound callus from the bottom of his right  foot. I prepared the bottom of his left foot for application of the trial skin substitute we have been using (Vendaje). The product was large enough that I used a small portion in the right foot, as well. The product was secured in place with Adaptic and Steri-Strips. Gauze sponges were used to create a bolster for better contact of the product with the wound bed. A total contact cast was applied to the left foot. Follow-up in 1 week. Electronic Signature(s) Signed: 02/24/2022 10:24:04 AM By: Fredirick Maudlin MD FACS Entered By: Fredirick Maudlin on 02/24/2022 10:24:04 -------------------------------------------------------------------------------- HxROS Details Patient Name: Date of Service: [name], [place]. 02/24/2022 9:30 A M Medical Record Number: [id number] Patient Account Number: [id number] Date of Birth/Sex: Treating RN: Aug 15, 1975 (46 y.o. [name] Primary Care Provider: [name] Other Clinician: Referring Provider: Treating Provider/Extender: [name] in Treatment: 62 Information Obtained From Patient Constitutional Symptoms (General Health) Medical History: Past Medical History Notes: COVID PNA 07/22/2019-11/14/2019 [facility] ECMO, foot drop left foot , Eyes Medical History: Negative for: Cataracts; Glaucoma; Optic Neuritis Ear/Nose/Mouth/Throat Medical History: Negative for: Chronic sinus problems/congestion; Middle ear problems Hematologic/Lymphatic Medical History: Negative for: Anemia; Hemophilia; Human Immunodeficiency Virus; Lymphedema; Sickle Cell Disease Respiratory Medical History: Positive for: Asthma Negative for: Aspiration; Chronic Obstructive Pulmonary Disease (COPD); Pneumothorax; Sleep Apnea; Tuberculosis Cardiovascular Medical History: Positive for: Angina - with COVID; Hypertension Negative for: Arrhythmia; Congestive Heart Failure; Coronary Artery Disease; Deep Vein Thrombosis; Hypotension; Myocardial Infarction;  Peripheral Arterial Disease; Peripheral Venous Disease; Phlebitis; Vasculitis Gastrointestinal Medical History: Negative for: Cirrhosis ; Colitis; Crohns; Hepatitis A; Hepatitis B; Hepatitis C Endocrine Medical History: Negative for: Type I Diabetes; Type II Diabetes Genitourinary Medical History: Negative for: End Stage Renal Disease Past Medical History Notes: kidney stone Immunological Medical History: Negative for: Lupus Erythematosus; Raynauds; Scleroderma Integumentary (Skin) Medical History: Negative for: History of Burn Musculoskeletal Medical History: Negative for: Gout; Rheumatoid Arthritis; Osteoarthritis; Osteomyelitis Neurologic Medical History: Negative for: Dementia; Neuropathy; Quadriplegia; Paraplegia; Seizure Disorder Oncologic Medical History: Negative for: Received Chemotherapy; Received Radiation Psychiatric Medical History: Negative for: Anorexia/bulimia; Confinement Anxiety Past Medical History Notes: anxiety Immunizations Pneumococcal Vaccine: Received Pneumococcal Vaccination: No Implantable Devices None Hospitalization / Surgery History Type of Hospitalization/Surgery COVID PNA 07/22/2019- 11/14/2019 03/27/2020 wound debridement/ skin graft Family and Social History Cancer: Yes - Maternal Grandparents; Diabetes: Yes - Father,Paternal Grandparents; Heart Disease: Yes - Maternal Grandparents; Hereditary Spherocytosis: No; Hypertension: Yes - Father,Paternal Grandparents; Kidney Disease: No; Lung Disease: Yes - Siblings; Seizures: No; Stroke: No; Thyroid Problems: No; Tuberculosis: No; Never smoker; Marital Status - Married; Alcohol Use: Never; Drug Use: No History; Caffeine Use: Daily - tea, soda; Financial Concerns: No; Food, Clothing or Shelter Needs: No; Support System Lacking: No; Transportation Concerns: No Engineer, maintenance) Signed: 02/24/2022 12:47:40 PM By: Fredirick Maudlin MD FACS Signed: 02/28/2022 12:55:32 PM By: [name]  RN Entered By: Fredirick Maudlin on 02/24/2022 10:19:58 -------------------------------------------------------------------------------- Total Contact Cast Details Patient Name: Date of Service: [name], [place] 02/24/2022 9:30 A M Medical Record Number: [id number] Patient Account Number: [id number] Date of Birth/Sex: Treating RN: November 27, 1975 (46 y.o. [name] Primary Care Provider: [name] Other Clinician: Referring Provider: Treating Provider/Extender: [name] in Treatment: 9 T Contact Cast Applied for Wound Assessment: otal Wound #  2 Left Calcaneus Performed By: Physician Fredirick Maudlin, MD Post Procedure Diagnosis Same as Pre-procedure Electronic Signature(s) Signed: 02/24/2022 12:47:40 PM By: Fredirick Maudlin MD FACS Signed: 02/24/2022 5:36:35 PM By: Sharyn Creamer RN, BSN Entered By: Sharyn Creamer on 02/24/2022 10:12:15 -------------------------------------------------------------------------------- SuperBill Details Patient Name: Date of Service: [name], [place]. 02/24/2022 Medical Record Number: [id number] Patient Account Number: [id number] Date of Birth/Sex: Treating RN: [birth year] (46 y.o. [name] Primary Care Provider: [name] Other Clinician: Referring Provider: Treating Provider/Extender: [name] in Treatment: 39 Diagnosis Coding ICD-10 Codes Code Description [phone] Non-pressure chronic ulcer of other part of right foot with fat layer exposed L97.522 Non-pressure chronic ulcer of other part of left foot with fat layer exposed Facility Procedures CPT4 Code: [id number] Description: [place] - DEB SUBQ TISSUE 20 SQ CM/< ICD-10 Diagnosis Description L97.512 Non-pressure chronic ulcer of other part of right foot with fat layer exposed Modifier: Quantity: 1 CPT4 Code: [id number] Description: [id number] - APPLY TOTAL CONTACT LEG CAST ICD-10 Diagnosis Description L97.522 Non-pressure chronic  ulcer of other part of left foot with fat layer exposed Modifier: Quantity: 1 Physician Procedures : CPT4 Code Description Modifier [id number] - WC PHYS LEVEL 3 - EST PT 25 ICD-10 Diagnosis Description L97.512 Non-pressure chronic ulcer of other part of right foot with fat layer exposed L97.522 Non-pressure chronic ulcer of other part of left foot  with fat layer exposed Quantity: 1 : [id number] - WC PHYS SUBQ TISS 20 SQ CM ICD-10 Diagnosis Description L97.512 Non-pressure chronic ulcer of other part of right foot with fat layer exposed Quantity: 1 : [id number] - WC PHYS APPLY TOTAL CONTACT CAST ICD-10 Diagnosis Description L97.522 Non-pressure chronic ulcer of other part of left foot with fat layer exposed Quantity: 1 Electronic Signature(s) Signed: 02/24/2022 10:24:44 AM By: Fredirick Maudlin MD FACS Entered By: Fredirick Maudlin on 02/24/2022 10:24:43

## 2022-02-28 NOTE — Progress Notes (Signed)
CLERANCE, UMLAND ([id number]) Visit Report for 02/24/2022 Arrival Information Details Patient Name: Date of Service: [name], TO [name] 02/24/2022 9:30 A M Medical Record Number: [id number] Patient Account Number: [id number] Date of Birth/Sex: Treating RN: 10/17/1975 (46 y.o. Cline Cools Primary Care Makeba Delcastillo: Dorinda Hill Other Clinician: Referring Shyasia Funches: Treating Una Yeomans/Extender: Jana Hakim in Treatment: 12 Visit Information History Since Last Visit Added or deleted any medications: No Patient Arrived: Wheel Chair Any new allergies or adverse reactions: No Arrival Time: 09:31 Had a fall or experienced change in No Accompanied By: Wife activities of daily living that may affect Transfer Assistance: None risk of falls: Patient Identification Verified: Yes Signs or symptoms of abuse/neglect since last visito No Secondary Verification Process Completed: Yes Hospitalized since last visit: No Patient Requires Transmission-Based Precautions: No Implantable device outside of the clinic excluding No Patient Has Alerts: No cellular tissue based products placed in the center since last visit: Has Dressing in Place as Prescribed: Yes Has Compression in Place as Prescribed: Yes Pain Present Now: Yes Electronic Signature(s) Signed: 02/24/2022 5:36:35 PM By: Redmond Pulling RN, BSN Entered By: Redmond Pulling on 02/24/2022 09:31:50 -------------------------------------------------------------------------------- Encounter Discharge Information Details Patient Name: Date of Service: [name], [name] 02/24/2022 9:30 A M Medical Record Number: [id number] Patient Account Number: [id number] Date of Birth/Sex: Treating RN: 06-Dec-1975 (46 y.o. Cline Cools Primary Care Violett Hobbs: Dorinda Hill Other Clinician: Referring Remigio Mcmillon: Treating Loyda Costin/Extender: Jana Hakim in Treatment: 64 Encounter Discharge Information Items Post Procedure  Vitals Discharge Condition: Stable Temperature (F): 99.2 Ambulatory Status: Wheelchair Pulse (bpm): 90 Discharge Destination: Home Respiratory Rate (breaths/min): 18 Transportation: Private Auto Blood Pressure (mmHg): 158/76 Accompanied By: wife Schedule Follow-up Appointment: Yes Clinical Summary of Care: Patient Declined Electronic Signature(s) Signed: 02/24/2022 5:36:35 PM By: Redmond Pulling RN, BSN Entered By: Redmond Pulling on 02/24/2022 17:08:06 -------------------------------------------------------------------------------- Lower Extremity Assessment Details Patient Name: Date of Service: [name], TO [name] 02/24/2022 9:30 A M Medical Record Number: [id number] Patient Account Number: [id number] Date of Birth/Sex: Treating RN: [birth year] (46 y.o. Cline Cools Primary Care Quisha Mabie: Dorinda Hill Other Clinician: Referring Carly Applegate: Treating Salvador Bigbee/Extender: Jana Hakim in Treatment: 84 Edema Assessment Assessed: [name]: No] Franne Forts: No] Edema: [Left: No] [Right: No] Calf Left: Right: Point of Measurement: 29 cm From Medial Instep 39.5 cm 43 cm Ankle Left: Right: Point of Measurement: 9 cm From Medial Instep 24.5 cm 24 cm Vascular Assessment Pulses: Dorsalis Pedis Palpable: [Left:Yes] [Right:Yes] Electronic Signature(s) Signed: 02/24/2022 5:36:35 PM By: Redmond Pulling RN, BSN Entered By: Redmond Pulling on 02/24/2022 09:44:16 -------------------------------------------------------------------------------- Multi Wound Chart Details Patient Name: Date of Service: [name], [name] 02/24/2022 9:30 A M Medical Record Number: [id number] Patient Account Number: [id number] Date of Birth/Sex: Treating RN: 1976-05-03 (46 y.o. [name] Primary Care Leisha Trinkle: Dorinda Hill Other Clinician: Referring Jasai Sorg: Treating Ife Vitelli/Extender: Jana Hakim in Treatment: 24 Vital Signs Height(in): 69 Pulse(bpm):  90 Weight(lbs): 280 Blood Pressure(mmHg): 158/76 Body Mass Index(BMI): 41.3 Temperature(F): 99.2 Respiratory Rate(breaths/min): 18 Photos: [N/A:N/A] Right Calcaneus Left Calcaneus N/A Wound Location: Pressure Injury Pressure Injury N/A Wounding Event: Pressure Ulcer Pressure Ulcer N/A Primary Etiology: Asthma, Angina, Hypertension Asthma, Angina, Hypertension N/A Comorbid History: 10/07/2019 10/07/2019 N/A Date Acquired: 58 84 N/A Weeks of Treatment: Open Open N/A Wound Status: No No N/A Wound Recurrence: 0.5x0.5x0.1 2.6x1.2x0.2 N/A Measurements L x W x D (cm) 0.196 2.45 N/A A (cm) : rea 0.02 0.49 N/A Volume (cm) :  99.40% 91.00% N/A % Reduction in A rea: 99.90% 98.20% N/A % Reduction in Volume: Category/Stage III Category/Stage III N/A Classification: Medium Medium N/A Exudate A mount: Serosanguineous Serosanguineous N/A Exudate Type: red, brown red, brown N/A Exudate Color: Thickened Distinct, outline attached N/A Wound Margin: Small (1-33%) Medium (34-66%) N/A Granulation A mount: Pink Red N/A Granulation Quality: Large (67-100%) Medium (34-66%) N/A Necrotic A mount: Eschar, Adherent Slough Adherent Slough N/A Necrotic Tissue: Fat Layer (Subcutaneous Tissue): Yes Fat Layer (Subcutaneous Tissue): Yes N/A Exposed Structures: Fascia: No Fascia: No Tendon: No Tendon: No Muscle: No Muscle: No Joint: No Joint: No Bone: No Bone: No Large (67-100%) Large (67-100%) N/A Epithelialization: Debridement - Selective/Open Wound N/A N/A Debridement: Pre-procedure Verification/Time Out 09:56 N/A N/A Taken: Lidocaine 5% topical ointment N/A N/A Pain Control: Slough N/A N/A Tissue Debrided: Non-Viable Tissue N/A N/A Level: 0.25 N/A N/A Debridement A (sq cm): rea Curette N/A N/A Instrument: Minimum N/A N/A Bleeding: Pressure N/A N/A Hemostasis A chieved: 0 N/A N/A Procedural Pain: 0 N/A N/A Post Procedural Pain: Procedure was tolerated well N/A  N/A Debridement Treatment Response: 0.5x0.5x0.1 N/A N/A Post Debridement Measurements L x W x D (cm) 0.02 N/A N/A Post Debridement Volume: (cm) Category/Stage III N/A N/A Post Debridement Stage: Cellular or Tissue Based Product Cellular or Tissue Based Product N/A Procedures Performed: Debridement T Contact Cast otal Treatment Notes Electronic Signature(s) Signed: 02/24/2022 10:18:26 AM By: Fredirick Maudlin MD FACS Signed: 02/28/2022 12:55:32 PM By: [name] Entered By: Fredirick Maudlin on 02/24/2022 10:18:26 -------------------------------------------------------------------------------- Multi-Disciplinary Care Plan Details Patient Name: Date of Service: [name], [name] 02/24/2022 9:30 A M Medical Record Number: [id number] Patient Account Number: [id number] Date of Birth/Sex: Treating RN: Nov 04, 1975 (46 y.o. [name] Primary Care Heron Pitcock: [name] Other Clinician: Referring Krishika Bugge: Treating Janaisha Tolsma/Extender: [name] in Treatment: 34 [place] reviewed with physician Active Inactive Wound/Skin Impairment Nursing Diagnoses: Impaired tissue integrity Knowledge deficit related to ulceration/compromised skin integrity Goals: Patient/caregiver will verbalize understanding of skin care regimen Date Initiated: 07/15/2020 Target Resolution Date: 07/07/2022 Goal Status: Active Ulcer/skin breakdown will have a volume reduction of 30% by week 4 Date Initiated: 07/15/2020 Date Inactivated: 08/20/2020 Target Resolution Date: 09/03/2020 Goal Status: Unmet Unmet Reason: no major changes. Ulcer/skin breakdown will heal within 14 weeks Date Initiated: 12/04/2020 Date Inactivated: 12/10/2020 Target Resolution Date: 12/10/2020 Unmet Reason: wounds still open at 14 Goal Status: Unmet weeks and today 21 weeks. Interventions: Assess patient/caregiver ability to obtain necessary supplies Assess patient/caregiver ability  to perform ulcer/skin care regimen upon admission and as needed Assess ulceration(s) every visit Provide education on ulcer and skin care Treatment Activities: Skin care regimen initiated : 07/15/2020 Topical wound management initiated : 07/15/2020 Notes: Electronic Signature(s) Signed: 02/24/2022 5:36:35 PM By: Sharyn Creamer RN, BSN Entered By: Sharyn Creamer on 02/24/2022 17:09:11 -------------------------------------------------------------------------------- Pain Assessment Details Patient Name: Date of Service: [name], [place] 02/24/2022 9:30 A M Medical Record Number: [id number] Patient Account Number: [id number] Date of Birth/Sex: Treating RN: [birth year] (46 y.o. [name] Primary Care Sheyann Sulton: [name] Other Clinician: Referring Almus Woodham: Treating Alaster Asfaw/Extender: [name] in Treatment: 30 Active Problems Location of Pain Severity and Description of Pain Patient Has Paino Yes Site Locations Rate the pain. Current Pain Level: 4 Pain Management and Medication Current Pain Management: Electronic Signature(s) Signed: 02/24/2022 5:36:35 PM By: Sharyn Creamer RN, BSN Entered By: Sharyn Creamer on 02/24/2022 09:32:38 -------------------------------------------------------------------------------- Patient/Caregiver Education Details Patient Name: Date of Service:  GO Alyson Reedy, TO [place] 7/20/2023andnbsp9:30 A M Medical Record Number: [id number] Patient Account Number: [id number] Date of Birth/Gender: Treating RN: 05-23-1976 (46 y.o. Cline Cools Primary Care Physician: Dorinda Hill Other Clinician: Referring Physician: Treating Physician/Extender: Jana Hakim in Treatment: 38 Education Assessment Education Provided To: Patient Education Topics Provided Wound/Skin Impairment: Methods: Explain/Verbal Responses: State content correctly [employer]) Signed: 02/24/2022 5:36:35 PM By: Redmond Pulling RN, BSN Entered By: Redmond Pulling on 02/24/2022 17:09:27 -------------------------------------------------------------------------------- Wound Assessment Details Patient Name: Date of Service: [name], TO [name] 02/24/2022 9:30 A M Medical Record Number: [id number] Patient Account Number: [id number] Date of Birth/Sex: Treating RN: Sep 07, 1975 (46 y.o. Cline Cools Primary Care Jennah Satchell: Dorinda Hill Other Clinician: Referring Duell Holdren: Treating Harrington Jobe/Extender: Jana Hakim in Treatment: 1 Wound Status Wound Number: 1 Primary Etiology: Pressure Ulcer Wound Location: Right Calcaneus Wound Status: Open Wounding Event: Pressure Injury Comorbid History: Asthma, Angina, Hypertension Date Acquired: 10/07/2019 Weeks Of Treatment: 84 Clustered Wound: No Photos Wound Measurements Length: (cm) 0.5 Width: (cm) 0.5 Depth: (cm) 0.1 Area: (cm) 0.196 Volume: (cm) 0.02 % Reduction in Area: 99.4% % Reduction in Volume: 99.9% Epithelialization: Large (67-100%) Tunneling: No Undermining: No Wound Description Classification: Category/Stage III Wound Margin: Thickened Exudate Amount: Medium Exudate Type: Serosanguineous Exudate Color: red, brown Foul Odor After Cleansing: No Slough/Fibrino Yes Wound Bed Granulation Amount: Small (1-33%) Exposed Structure Granulation Quality: Pink Fascia Exposed: No Necrotic Amount: Large (67-100%) Fat Layer (Subcutaneous Tissue) Exposed: Yes Necrotic Quality: Eschar, Adherent Slough Tendon Exposed: No Muscle Exposed: No Joint Exposed: No Bone Exposed: No Treatment Notes Wound #1 (Calcaneus) Wound Laterality: Right Cleanser Normal Saline Discharge Instruction: Cleanse the wound with Normal Saline prior to applying a clean dressing using gauze sponges, not tissue or cotton balls. Wound Cleanser Discharge Instruction: Cleanse the wound with wound cleanser prior to applying a clean dressing using gauze sponges,  not tissue or cotton balls. Peri-Wound Care Topical Primary Dressing VENDAJE Secondary Dressing Zetuvit Plus 4x8 in Discharge Instruction: Apply over primary dressing as directed. Secured With Elastic Bandage 4 inch (ACE bandage) Discharge Instruction: Secure with ACE bandage as directed. Kerlix Roll Sterile, 4.5x3.1 (in/yd) Discharge Instruction: Secure with Kerlix as directed. Compression Wrap Compression Stockings Add-Ons Electronic Signature(s) Signed: 02/24/2022 5:36:35 PM By: Redmond Pulling RN, BSN Entered By: Redmond Pulling on 02/24/2022 09:47:41 -------------------------------------------------------------------------------- Wound Assessment Details Patient Name: Date of Service: [name], TO [name] 02/24/2022 9:30 A M Medical Record Number: [id number] Patient Account Number: [id number] Date of Birth/Sex: Treating RN: 1975-11-08 (46 y.o. Cline Cools Primary Care Christpoher Sievers: Dorinda Hill Other Clinician: Referring Christon Parada: Treating Melonie Germani/Extender: Jana Hakim in Treatment: 27 Wound Status Wound Number: 2 Primary Etiology: Pressure Ulcer Wound Location: Left Calcaneus Wound Status: Open Wounding Event: Pressure Injury Comorbid History: Asthma, Angina, Hypertension Date Acquired: 10/07/2019 Weeks Of Treatment: 84 Clustered Wound: No Photos Wound Measurements Length: (cm) 2.6 Width: (cm) 1.2 Depth: (cm) 0.2 Area: (cm) 2.45 Volume: (cm) 0.49 % Reduction in Area: 91% % Reduction in Volume: 98.2% Epithelialization: Large (67-100%) Tunneling: No Undermining: No Wound Description Classification: Category/Stage III Wound Margin: Distinct, outline attached Exudate Amount: Medium Exudate Type: Serosanguineous Exudate Color: red, brown Foul Odor After Cleansing: No Slough/Fibrino Yes Wound Bed Granulation Amount: Medium (34-66%) Exposed Structure Granulation Quality: Red Fascia Exposed: No Necrotic Amount: Medium (34-66%) Fat  Layer (Subcutaneous Tissue) Exposed: Yes Necrotic Quality: Adherent Slough Tendon Exposed: No Muscle Exposed: No Joint Exposed: No Bone Exposed: No Treatment Notes Wound #  2 (Calcaneus) Wound Laterality: Left Cleanser Normal Saline Discharge Instruction: Cleanse the wound with Normal Saline prior to applying a clean dressing using gauze sponges, not tissue or cotton balls. Wound Cleanser Discharge Instruction: Cleanse the wound with wound cleanser prior to applying a clean dressing using gauze sponges, not tissue or cotton balls. Peri-Wound Care Zinc Oxide Ointment 30g tube Discharge Instruction: Apply Zinc Oxide to periwound with each dressing change Topical Primary Dressing VENDAJE Secondary Dressing Optifoam Non-Adhesive Dressing, 4x4 in Discharge Instruction: Apply over primary dressing as directed. Zetuvit Plus 4x8 in Discharge Instruction: Apply over primary dressing as directed. Secured With [employer], 4.5x3.1 (in/yd) Discharge Instruction: Secure with Kerlix as directed. 46M Medipore H Soft Cloth Surgical T ape, 4 x 10 (in/yd) Discharge Instruction: Secure with tape as directed. Compression Wrap Compression Stockings Add-Ons Electronic Signature(s) Signed: 02/24/2022 5:36:35 PM By: Sharyn Creamer RN, BSN Entered By: Sharyn Creamer on 02/24/2022 09:46:08 -------------------------------------------------------------------------------- [place] Details Patient Name: Date of Service: [name], [place] 02/24/2022 9:30 A M Medical Record Number: [id number] Patient Account Number: [id number] Date of Birth/Sex: Treating RN: 1975-12-07 (46 y.o. [name] Primary Care Rex Magee: [name] Other Clinician: Referring Maddisen Vought: Treating Nohely Whitehorn/Extender: [name] in Treatment: 13 Vital Signs Time Taken: 09:30 Temperature (F): 99.2 Height (in): 69 Pulse (bpm): 90 Weight (lbs): 280 Respiratory Rate (breaths/min): 18 Body Mass  Index (BMI): 41.3 Blood Pressure (mmHg): 158/76 Reference Range: 80 - 120 mg / dl Electronic Signature(s) Signed: 02/24/2022 5:36:35 PM By: Sharyn Creamer RN, BSN Entered By: Sharyn Creamer on 02/24/2022 09:32:27

## 2022-03-01 ENCOUNTER — Encounter (HOSPITAL_BASED_OUTPATIENT_CLINIC_OR_DEPARTMENT_OTHER): Payer: PPO | Admitting: General Surgery

## 2022-03-01 DIAGNOSIS — L97512 Non-pressure chronic ulcer of other part of right foot with fat layer exposed: Secondary | ICD-10-CM | POA: Diagnosis not present

## 2022-03-01 DIAGNOSIS — L89620 Pressure ulcer of left heel, unstageable: Secondary | ICD-10-CM | POA: Diagnosis not present

## 2022-03-01 DIAGNOSIS — L89613 Pressure ulcer of right heel, stage 3: Secondary | ICD-10-CM | POA: Diagnosis not present

## 2022-03-01 NOTE — Progress Notes (Signed)
[name], [name] ([id number]) Visit Report for 03/01/2022 Arrival Information Details Patient Name: Date of Service: [name], TO [name] 03/01/2022 10:00 A M Medical Record Number: [id number] Patient Account Number: [id number] Date of Birth/Sex: Treating RN: 23-Jun-1976 (46 y.o. [name] Primary Care Shanora Christensen: Dorinda Hill Other Clinician: Referring Nickoli Bagheri: Treating Micky Sheller/Extender: Jana Hakim in Treatment: 66 Visit Information History Since Last Visit Added or deleted any medications: No Patient Arrived: Wheel Chair Any new allergies or adverse reactions: No Arrival Time: 10:32 Had a fall or experienced change in No Accompanied By: son activities of daily living that may affect Transfer Assistance: None risk of falls: Patient Identification Verified: Yes Signs or symptoms of abuse/neglect since last visito No Secondary Verification Process Completed: Yes Hospitalized since last visit: No Patient Requires Transmission-Based Precautions: No Implantable device outside of the clinic excluding No Patient Has Alerts: No cellular tissue based products placed in the center since last visit: Has Dressing in Place as Prescribed: Yes Has Footwear/Offloading in Place as Prescribed: Yes Left: T Contact Cast otal Right: Other:globoped Pain Present Now: Yes Electronic Signature(s) Signed: 03/01/2022 4:42:49 PM By: Zenaida Deed RN, BSN Entered By: Zenaida Deed on 03/01/2022 10:33:03 -------------------------------------------------------------------------------- Encounter Discharge Information Details Patient Name: Date of Service: [name], [name] 03/01/2022 10:00 A M Medical Record Number: [id number] Patient Account Number: [id number] Date of Birth/Sex: Treating RN: Mar 11, 1976 (46 y.o. [name] Primary Care Joyous Gleghorn: Dorinda Hill Other Clinician: Referring Kenden Brandt: Treating Tuvia Woodrick/Extender: Jana Hakim in  Treatment: 57 Encounter Discharge Information Items Post Procedure Vitals Discharge Condition: Stable Temperature (F): 99 Ambulatory Status: Wheelchair Pulse (bpm): 77 Discharge Destination: Home Respiratory Rate (breaths/min): 18 Transportation: Private Auto Blood Pressure (mmHg): 143/73 Accompanied By: son Schedule Follow-up Appointment: Yes Clinical Summary of Care: Patient Declined Electronic Signature(s) Signed: 03/01/2022 4:42:49 PM By: Zenaida Deed RN, BSN Entered By: Zenaida Deed on 03/01/2022 12:12:14 -------------------------------------------------------------------------------- Lower Extremity Assessment Details Patient Name: Date of Service: [name], TO [name] 03/01/2022 10:00 A M Medical Record Number: [id number] Patient Account Number: [id number] Date of Birth/Sex: Treating RN: [birth year] (46 y.o. [name] Primary Care Jette Lewan: Dorinda Hill Other Clinician: Referring Kanden Carey: Treating Palmer Shorey/Extender: Jana Hakim in Treatment: 84 Edema Assessment Assessed: [name]: No] [Right: No] Edema: [Left: No] [Right: No] Calf Left: Right: Point of Measurement: 29 cm From Medial Instep 42 cm 41.8 cm Ankle Left: Right: Point of Measurement: 9 cm From Medial Instep 25 cm 24 cm Vascular Assessment Pulses: Dorsalis Pedis Palpable: [Left:Yes] [Right:Yes] Electronic Signature(s) Signed: 03/01/2022 4:42:49 PM By: Zenaida Deed RN, BSN Entered By: Zenaida Deed on 03/01/2022 10:50:12 -------------------------------------------------------------------------------- Multi Wound Chart Details Patient Name: Date of Service: [name], [name] 03/01/2022 10:00 A M Medical Record Number: [id number] Patient Account Number: [id number] Date of Birth/Sex: Treating RN: 1976-02-02 (46 y.o. [name] Primary Care Dimitri Shakespeare: Dorinda Hill Other Clinician: Referring Sharvil Hoey: Treating Wava Kildow/Extender: Jana Hakim in Treatment: 2 Vital Signs Height(in): 69 Pulse(bpm): 77 Weight(lbs): 280 Blood Pressure(mmHg): 143/73 Body Mass Index(BMI): 41.3 Temperature(F): 99 Respiratory Rate(breaths/min): 18 Photos: [1:Right Calcaneus] [2:Left Calcaneus] [N/A:N/A N/A] Wound Location: [1:Pressure Injury] [2:Pressure Injury] [N/A:N/A] Wounding Event: [1:Pressure Ulcer] [2:Pressure Ulcer] [N/A:N/A] Primary Etiology: [1:Asthma, Angina, Hypertension] [2:Asthma, Angina, Hypertension] [N/A:N/A] Comorbid History: [1:10/07/2019] [2:10/07/2019] [N/A:N/A] Date Acquired: [1:84] [2:84] [N/A:N/A] Weeks of Treatment: [1:Open] [2:Open] [N/A:N/A] Wound Status: [1:No] [2:No] [N/A:N/A] Wound Recurrence: [1:0.7x0.2x0.3] [2:2.3x0.5x0.1] [N/A:N/A] Measurements L x W x D (cm) [1:0.11] [2:0.903] [N/A:N/A] A (cm) :  rea [1:0.033] [2:0.09] [N/A:N/A] Volume (cm) : [1:99.70%] [2:96.70%] [N/A:N/A] % Reduction in A rea: [1:99.90%] [2:99.70%] [N/A:N/A] % Reduction in Volume: [1:Category/Stage III] [2:Category/Stage III] [N/A:N/A] Classification: [1:Medium] [2:Medium] [N/A:N/A] Exudate A mount: [1:Serosanguineous] [2:Serosanguineous] [N/A:N/A] Exudate Type: [1:red, brown] [2:red, brown] [N/A:N/A] Exudate Color: [1:Thickened] [2:Distinct, outline attached] [N/A:N/A] Wound Margin: [1:Medium (34-66%)] [2:Medium (34-66%)] [N/A:N/A] Granulation A mount: [1:Pink] [2:Red] [N/A:N/A] Granulation Quality: [1:Medium (34-66%)] [2:Medium (34-66%)] [N/A:N/A] Necrotic A mount: [1:Fat Layer (Subcutaneous Tissue): Yes Fat Layer (Subcutaneous Tissue): Yes N/A] Exposed Structures: [1:Fascia: No Tendon: No Muscle: No Joint: No Bone: No Large (67-100%)] [2:Fascia: No Tendon: No Muscle: No Joint: No Bone: No Large (67-100%)] [N/A:N/A] Epithelialization: [1:Debridement - Selective/Open Wound N/A] [N/A:N/A] Debridement: Pre-procedure Verification/Time Out 10:55 [2:N/A] [N/A:N/A] Taken: [1:Callus, Slough] [2:N/A] [N/A:N/A] Tissue Debrided:  [1:Skin/Epidermis] [2:N/A] [N/A:N/A] Level: [1:1.5] [2:N/A] [N/A:N/A] Debridement A (sq cm): [1:rea Curette] [2:N/A] [N/A:N/A] Instrument: [1:Minimum] [2:N/A] [N/A:N/A] Bleeding: [1:Pressure] [2:N/A] [N/A:N/A] Hemostasis A chieved: [1:0] [2:N/A] [N/A:N/A] Procedural Pain: [1:0] [2:N/A] [N/A:N/A] Post Procedural Pain: [1:Procedure was tolerated well] [2:N/A] [N/A:N/A] Debridement Treatment Response: [1:0.7x0.2x0.3] [2:N/A] [N/A:N/A] Post Debridement Measurements L x W x D (cm) [1:0.033] [2:N/A] [N/A:N/A] Post Debridement Volume: (cm) [1:Category/Stage III] [2:N/A] [N/A:N/A] Post Debridement Stage: [1:Debridement] [2:T Contact Cast otal] [N/A:N/A] Treatment Notes Electronic Signature(s) Signed: 03/01/2022 11:14:40 AM By: Fredirick Maudlin MD FACS Signed: 03/01/2022 4:42:49 PM By: Baruch Gouty RN, BSN Entered By: Fredirick Maudlin on 03/01/2022 11:14:40 -------------------------------------------------------------------------------- Multi-Disciplinary Care Plan Details Patient Name: Date of Service: [name], [place] 03/01/2022 10:00 A M Medical Record Number: [id number] Patient Account Number: [id number] Date of Birth/Sex: Treating RN: [birth year] (46 y.o. [name] Primary Care Blaike Vickers: [name] Other Clinician: Referring Copper Kirtley: Treating Ravynn Hogate/Extender: [name] in Treatment: 35 [place] reviewed with physician Active Inactive Wound/Skin Impairment Nursing Diagnoses: Impaired tissue integrity Knowledge deficit related to ulceration/compromised skin integrity Goals: Patient/caregiver will verbalize understanding of skin care regimen Date Initiated: 07/15/2020 Target Resolution Date: 07/07/2022 Goal Status: Active Ulcer/skin breakdown will have a volume reduction of 30% by week 4 Date Initiated: 07/15/2020 Date Inactivated: 08/20/2020 Target Resolution Date: 09/03/2020 Goal Status: Unmet Unmet Reason: no major  changes. Ulcer/skin breakdown will heal within 14 weeks Date Initiated: 12/04/2020 Date Inactivated: 12/10/2020 Target Resolution Date: 12/10/2020 Unmet Reason: wounds still open at 14 Goal Status: Unmet weeks and today 21 weeks. Interventions: Assess patient/caregiver ability to obtain necessary supplies Assess patient/caregiver ability to perform ulcer/skin care regimen upon admission and as needed Assess ulceration(s) every visit Provide education on ulcer and skin care Treatment Activities: Skin care regimen initiated : 07/15/2020 Topical wound management initiated : 07/15/2020 Notes: Electronic Signature(s) Signed: 03/01/2022 4:42:49 PM By: Baruch Gouty RN, BSN Entered By: Baruch Gouty on 03/01/2022 10:57:38 -------------------------------------------------------------------------------- Pain Assessment Details Patient Name: Date of Service: [name], [place]. 03/01/2022 10:00 [place] Record Number: [id number] Patient Account Number: [id number] Date of Birth/Sex: Treating RN: May 06, 1976 (46 y.o. [name] Primary Care Shaniqwa Horsman: [name] Other Clinician: Referring Jaiveon Suppes: Treating Lawerance Matsuo/Extender: [name] in Treatment: 66 Active Problems Location of Pain Severity and Description of Pain Patient Has Paino Yes Site Locations Pain Location: Pain in Ulcers With Dressing Change: No Rate the pain. Current Pain Level: 3 Worst Pain Level: 4 Least Pain Level: 0 Character of Pain Describe the Pain: Aching, Dull Pain Management and Medication Current Pain Management: Medication: Yes Is the Current Pain Management Adequate: Adequate Rest: Yes How does your wound impact your activities of daily livingo Sleep:  No Bathing: No Appetite: No Relationship With Others: No Bladder Continence: No Emotions: No Bowel Continence: No Work: No Toileting: No Drive: No Dressing: No Hobbies: No Electronic Signature(s) Signed:  03/01/2022 4:42:49 PM By: Zenaida Deed RN, BSN Entered By: Zenaida Deed on 03/01/2022 10:34:35 -------------------------------------------------------------------------------- Patient/Caregiver Education Details Patient Name: Date of Service: GO [place], [name] 7/25/2023andnbsp10:00 A M Medical Record Number: [id number] Patient Account Number: [id number] Date of Birth/Gender: Treating RN: September 09, 1975 (46 y.o. [name] Primary Care Physician: Dorinda Hill Other Clinician: Referring Physician: Treating Physician/Extender: Jana Hakim in Treatment: 31 Education Assessment Education Provided To: Patient Education Topics Provided Offloading: Methods: Explain/Verbal Responses: Reinforcements needed, State content correctly Wound/Skin Impairment: Methods: Explain/Verbal Responses: Reinforcements needed, State content correctly Electronic Signature(s) Signed: 03/01/2022 4:42:49 PM By: Zenaida Deed RN, BSN Entered By: Zenaida Deed on 03/01/2022 10:58:00 -------------------------------------------------------------------------------- Wound Assessment Details Patient Name: Date of Service: [name], [name] 03/01/2022 10:00 A M Medical Record Number: [id number] Patient Account Number: [id number] Date of Birth/Sex: Treating RN: Feb 13, 1976 (46 y.o. [name] Primary Care Rori Goar: Dorinda Hill Other Clinician: Referring Kayo Zion: Treating Enrigue Hashimi/Extender: Jana Hakim in Treatment: 13 Wound Status Wound Number: 1 Primary Etiology: Pressure Ulcer Wound Location: Right Calcaneus Wound Status: Open Wounding Event: Pressure Injury Comorbid History: Asthma, Angina, Hypertension Date Acquired: 10/07/2019 Weeks Of Treatment: 84 Clustered Wound: No Photos Wound Measurements Length: (cm) 0.7 Width: (cm) 0.2 Depth: (cm) 0.3 Area: (cm) 0.11 Volume: (cm) 0.033 % Reduction in Area: 99.7% % Reduction in Volume:  99.9% Epithelialization: Large (67-100%) Tunneling: No Undermining: No Wound Description Classification: Category/Stage III Wound Margin: Thickened Exudate Amount: Medium Exudate Type: Serosanguineous Exudate Color: red, brown Foul Odor After Cleansing: No Slough/Fibrino Yes Wound Bed Granulation Amount: Medium (34-66%) Exposed Structure Granulation Quality: Pink Fascia Exposed: No Necrotic Amount: Medium (34-66%) Fat Layer (Subcutaneous Tissue) Exposed: Yes Tendon Exposed: No Muscle Exposed: No Joint Exposed: No Bone Exposed: No Treatment Notes Wound #1 (Calcaneus) Wound Laterality: Right Cleanser Normal Saline Discharge Instruction: Cleanse the wound with Normal Saline prior to applying a clean dressing using gauze sponges, not tissue or cotton balls. Wound Cleanser Discharge Instruction: Cleanse the wound with wound cleanser prior to applying a clean dressing using gauze sponges, not tissue or cotton balls. Peri-Wound Care Topical Primary Dressing VENDAJE Secondary Dressing Optifoam Non-Adhesive Dressing, 4x4 in Discharge Instruction: Apply over primary dressing cut to make foam border Woven Gauze Sponge, Non-Sterile 4x4 in Discharge Instruction: Apply over primary dressing as directed. Secured With Elastic Bandage 4 inch (ACE bandage) Discharge Instruction: Secure with ACE bandage as directed. Kerlix Roll Sterile, 4.5x3.1 (in/yd) Discharge Instruction: Secure with Kerlix as directed. Compression Wrap Compression Stockings Add-Ons Electronic Signature(s) Signed: 03/01/2022 4:42:49 PM By: Zenaida Deed RN, BSN Entered By: Zenaida Deed on 03/01/2022 10:57:14 -------------------------------------------------------------------------------- Wound Assessment Details Patient Name: Date of Service: [name], [name] 03/01/2022 10:00 A M Medical Record Number: [id number] Patient Account Number: [id number] Date of Birth/Sex: Treating RN: 1975-08-24 (46 y.o. [name] Primary Care Lakin Romer: Dorinda Hill Other Clinician: Referring Diane Hanel: Treating Donel Osowski/Extender: Jana Hakim in Treatment: 62 Wound Status Wound Number: 2 Primary Etiology: Pressure Ulcer Wound Location: Left Calcaneus Wound Status: Open Wounding Event: Pressure Injury Comorbid History: Asthma, Angina, Hypertension Date Acquired: 10/07/2019 Weeks Of Treatment: 84 Clustered Wound: No Photos Wound Measurements Length: (cm) 2.3 Width: (cm) 0.5 Depth: (cm) 0.1 Area: (cm) 0.903 Volume: (cm) 0.09 % Reduction in Area: 96.7% % Reduction in Volume:  99.7% Epithelialization: Large (67-100%) Tunneling: No Undermining: No Wound Description Classification: Category/Stage III Wound Margin: Distinct, outline attached Exudate Amount: Medium Exudate Type: Serosanguineous Exudate Color: red, brown Foul Odor After Cleansing: No Slough/Fibrino Yes Wound Bed Granulation Amount: Medium (34-66%) Exposed Structure Granulation Quality: Red Fascia Exposed: No Necrotic Amount: Medium (34-66%) Fat Layer (Subcutaneous Tissue) Exposed: Yes Necrotic Quality: Adherent Slough Tendon Exposed: No Muscle Exposed: No Joint Exposed: No Bone Exposed: No Treatment Notes Wound #2 (Calcaneus) Wound Laterality: Left Cleanser Normal Saline Discharge Instruction: Cleanse the wound with Normal Saline prior to applying a clean dressing using gauze sponges, not tissue or cotton balls. Wound Cleanser Discharge Instruction: Cleanse the wound with wound cleanser prior to applying a clean dressing using gauze sponges, not tissue or cotton balls. Peri-Wound Care Zinc Oxide Ointment 30g tube Discharge Instruction: Apply Zinc Oxide to periwound with each dressing change Topical Primary Dressing VENDAJE Secondary Dressing Optifoam Non-Adhesive Dressing, 4x4 in Discharge Instruction: Apply over primary dressing as directed. Woven Gauze Sponge, Non-Sterile 4x4  in Discharge Instruction: Apply over primary dressing as directed. Secured With 35M Medipore H Soft Cloth Surgical T ape, 4 x 10 (in/yd) Discharge Instruction: Secure with tape as directed. Compression Wrap Compression Stockings Add-Ons Electronic Signature(s) Signed: 03/01/2022 11:46:15 AM By: Blanche East RN Signed: 03/01/2022 4:42:49 PM By: Baruch Gouty RN, BSN Entered By: Blanche East on 03/01/2022 10:57:11 -------------------------------------------------------------------------------- Vitals Details Patient Name: Date of Service: [name], [place] 03/01/2022 10:00 A M Medical Record Number: [id number] Patient Account Number: [id number] Date of Birth/Sex: Treating RN: 01/23/1976 (45 y.o. [name] Primary Care Tymesha Ditmore: [name] Other Clinician: Referring Clayson Riling: Treating Rowdy Guerrini/Extender: [name] in Treatment: 53 Vital Signs Time Taken: 10:33 Temperature (F): 99 Height (in): 69 Pulse (bpm): 77 Weight (lbs): 280 Respiratory Rate (breaths/min): 18 Body Mass Index (BMI): 41.3 Blood Pressure (mmHg): 143/73 Reference Range: 80 - 120 mg / dl Electronic Signature(s) Signed: 03/01/2022 4:42:49 PM By: Baruch Gouty RN, BSN Entered By: Baruch Gouty on 03/01/2022 10:33:55

## 2022-03-01 NOTE — Progress Notes (Signed)
LENWOOD, LEHMER ([id number]) Visit Report for 03/01/2022 Chief Complaint Document Details Patient Name: Date of Service: [name], [place] 03/01/2022 10:00 A M Medical Record Number: [id number] Patient Account Number: [id number] Date of Birth/Sex: Treating RN: 01/01/1976 (46 y.o. [name] Primary Care Provider: [name] Other Clinician: Referring Provider: Treating Provider/Extender: [name] in Treatment: 69 Information Obtained from: Patient Chief Complaint Bilateral Plantar Foot Ulcers Electronic Signature(s) Signed: 03/01/2022 11:14:47 AM By: Fredirick Maudlin MD FACS Entered By: Fredirick Maudlin on 03/01/2022 11:14:47 -------------------------------------------------------------------------------- Cellular or Tissue Based Product Details Patient Name: Date of Service: [name], [place] 03/01/2022 10:00 A M Medical Record Number: [id number] Patient Account Number: [id number] Date of Birth/Sex: Treating RN: 04-06-1976 (46 y.o. [name] Primary Care Provider: [name] Other Clinician: Referring Provider: Treating Provider/Extender: [name] in Treatment: 41 Cellular or Tissue Based Product Type Wound #1 Right Calcaneus Applied to: Performed By: Physician Fredirick Maudlin, MD Cellular or Tissue Based Product Type: Other Level of Consciousness (Pre-procedure): Awake and Alert Pre-procedure Verification/Time Out Yes - 11:00 Taken: Location: genitalia / hands / feet / multiple digits Wound Size (sq cm): 0.14 Product Size (sq cm): 4 Waste Size (sq cm): 0 Amount of Product Applied (sq cm): 4 Instrument Used: Forceps Lot #: [id number] Order #: [id number] Expiration Date: 10/13/2024 Fenestrated: No Reconstituted: No Secured: Yes Secured With: Steri-Strips Dressing Applied: Yes Primary Dressing: adaptic, gauze Procedural Pain: 0 Post Procedural Pain: 0 Response to Treatment: Procedure was tolerated  well Level of Consciousness (Post- Awake and Alert procedure): Post Procedure Diagnosis Same as Pre-procedure Notes donated trial Vendaje Electronic Signature(s) Signed: 03/01/2022 12:52:06 PM By: Fredirick Maudlin MD FACS Signed: 03/01/2022 4:42:49 PM By: Baruch Gouty RN, BSN Entered By: Baruch Gouty on 03/01/2022 11:14:42 -------------------------------------------------------------------------------- Cellular or Tissue Based Product Details Patient Name: Date of Service: [name], [place] 03/01/2022 10:00 A M Medical Record Number: [id number] Patient Account Number: [id number] Date of Birth/Sex: Treating RN: 1976-05-07 (46 y.o. [name] Primary Care Provider: [name] Other Clinician: Referring Provider: Treating Provider/Extender: [name] in Treatment: 62 Cellular or Tissue Based Product Type Wound #2 Left Calcaneus Applied to: Performed By: Physician Fredirick Maudlin, MD Cellular or Tissue Based Product Type: Other Level of Consciousness (Pre-procedure): Awake and Alert Pre-procedure Verification/Time Out Yes - 11:00 Taken: Location: genitalia / hands / feet / multiple digits Wound Size (sq cm): 1.15 Product Size (sq cm): 4 Waste Size (sq cm): 0 Amount of Product Applied (sq cm): 4 Instrument Used: Forceps Lot #: [id number] Order #: 11 Expiration Date: 10/13/2024 Fenestrated: No Reconstituted: No Secured: Yes Secured With: Steri-Strips Dressing Applied: Yes Primary Dressing: adaptic, gauze Procedural Pain: 0 Post Procedural Pain: 0 Response to Treatment: Procedure was tolerated well Level of Consciousness (Post- Awake and Alert procedure): Post Procedure Diagnosis Same as Pre-procedure Electronic Signature(s) Signed: 03/01/2022 12:52:06 PM By: Fredirick Maudlin MD FACS Signed: 03/01/2022 4:42:49 PM By: Baruch Gouty RN, BSN Entered By: Baruch Gouty on 03/01/2022  11:15:09 -------------------------------------------------------------------------------- Debridement Details Patient Name: Date of Service: [name], [place] 03/01/2022 10:00 [place] Record Number: [id number] Patient Account Number: [id number] Date of Birth/Sex: Treating RN: 1975-12-20 (46 y.o. [name] Primary Care Provider: Other Clinician: [name] Referring Provider: Treating Provider/Extender: [name] in Treatment: 84 Debridement Performed for Assessment: Wound #1 Right Calcaneus Performed By: Physician Fredirick Maudlin, MD Debridement Type: Debridement Level of Consciousness (Pre-procedure): Awake and  Alert Pre-procedure Verification/Time Out Yes - 10:55 Taken: Start Time: 10:55 T Area Debrided (L x W): otal 1.5 (cm) x 1 (cm) = 1.5 (cm) Tissue and other material debrided: Non-Viable, Callus, Slough, Skin: Epidermis, Slough Level: Skin/Epidermis Debridement Description: Selective/Open Wound Instrument: Curette Bleeding: Minimum Hemostasis Achieved: Pressure Procedural Pain: 0 Post Procedural Pain: 0 Response to Treatment: Procedure was tolerated well Level of Consciousness (Post- Awake and Alert procedure): Post Debridement Measurements of Total Wound Length: (cm) 0.7 Stage: Category/Stage III Width: (cm) 0.2 Depth: (cm) 0.3 Volume: (cm) 0.033 Character of Wound/Ulcer Post Debridement: Improved Post Procedure Diagnosis Same as Pre-procedure Electronic Signature(s) Signed: 03/01/2022 12:52:06 PM By: Fredirick Maudlin MD FACS Signed: 03/01/2022 4:42:49 PM By: Baruch Gouty RN, BSN Entered By: Baruch Gouty on 03/01/2022 11:08:13 -------------------------------------------------------------------------------- HPI Details Patient Name: Date of Service: [name], [place]. 03/01/2022 10:00 A M Medical Record Number: [id number] Patient Account Number: [id number] Date of Birth/Sex: Treating RN: [birth year] (46 y.o. [name] Primary Care Provider: [name] Other Clinician: Referring Provider: Treating Provider/Extender: [name] in Treatment: 60 History of Present Illness HPI Description: Wounds are12/03/2020 upon evaluation today patient presents for initial inspection here in our clinic concerning issues he has been having with the bottoms of his feet bilaterally. He states these actually occurred as wounds when he was hospitalized for 5 months secondary to Covid. He was apparently with tilting bed where he was in an upright position quite frequently and apparently this occurred in some way shape or form during that time. Fortunately there is no sign of active infection at this time. No fevers, chills, nausea, vomiting, or diarrhea. With that being said he still has substantial wounds on the plantar aspects of his feet Theragen require quite a bit of work to get these to heal. He has been using Santyl currently though that is been problematic both in receiving the medication as well as actually paid for it as it is become quite expensive. Prior to the experience with Covid the patient really did not have any major medical problems other than hypertension he does have some mild generalized weakness following the Covid experience. 07/22/2020 on evaluation today patient appears to be doing okay in regard to his foot ulcers I feel like the wound beds are showing signs of better improvement that I do believe the Iodoflex is helping in this regard. With that being said he does have a lot of drainage currently and this is somewhat blue/green in nature which is consistent with Pseudomonas. I do think a culture today would be appropriate for [place] to evaluate and see if that is indeed the case I would likely start him on antibiotic orally as well he is not allergic to Cipro knows of no issues he has had in the past 12/21; patient was admitted to the clinic earlier this month with  bilateral presumed pressure ulcers on the bottom of his feet apparently related to excessive pressure from a tilt table arrangement in the intensive care unit. Patient relates this to being on ECMO but I am not really sure that is exactly related to that. I must say I have never seen anything like this. He has fairly extensive full-thickness wounds extending from his heel towards his midfoot mostly centered laterally. There is already been some healing distally. He does not appear to have an arterial issue. He has been using gentamicin to the wound surfaces with Iodoflex to help with ongoing debridement 1/6; this is a patient  with pressure ulcers on the bottom of his feet related to excessive pressure from a standing position in the intensive care unit. He is complaining of a lot of pain in the right heel. He is not a diabetic. 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 We have been using Iodoflex to help prepare the surfaces of both wounds for an advanced treatment product. He is nonambulatory spending most of his time in a wheelchair I have asked him not to propel the wheelchair with his heels 1/13; in general his wounds look better not much surface area change we have been using Iodoflex as of last week. I did an x-ray of the right heel as the patient was complaining of pain. I had some thoughts about a stress fracture perhaps Achilles tendon problems however what it showed was erosive changes along the inferior aspect of the calcaneus he now has a MRI booked for 1/20. 1/20; in general his wounds continue to be better. Some improvement in the large narrow areas proximally in his foot. He is still complaining of pain in the right heel and tenderness in certain areas of this wound. His MRI is tonight. I am not just looking for osteomyelitis that was brought up on the x-ray I am wondering about stress fractures, tendon ruptures etc. He has no such findings on the left. Also  noteworthy is that the patient had critical illness neuropathy and some of the discomfort may be actual improvement in nerve function I am just not sure. These wounds were initially in the setting of severe critical illness related to COVID-19. He was put in a standing position. He may have also been on pressors at the point contributing to tissue ischemia. By his description at some point these wounds were grossly necrotic extending proximally up into the Achilles part of his heel. I do not know that I have ever really seen pictures of them like this although they may exist in epic We have ordered Tri layer Oasis. I am trying to stimulate some granulation in these areas. This is of course assuming the MRI is negative for infection 1/27; since the patient was last here he saw Dr. Juleen China of infectious disease. He is planned for vancomycin and ceftriaxone. Prior operative culture grew MSSA. Also ordered baseline lab work. He also ordered arterial studies although the ABIs in our clinic were normal as well as his clinical exam these were normal I do not think he needs to see vascular surgery. His ABIs at the PTA were 1.22 in the right triphasic waveforms with a normal TBI of 1.15 on the left ABI of 1.22 with triphasic waveforms and a normal TBI of 1.08. Finally he saw Dr. Amalia Hailey who will follow him in for 2 months. At this point I do not think he felt that he needed a procedure on the right calcaneal bone. Dr. Juleen China is elected for broad-spectrum antibiotic The patient is still having pain in the right heel. He walks with a walker 2/3; wounds are generally smaller. He is tolerating his IV antibiotics. I believe this is vancomycin and ceftriaxone. We are still waiting for Oasis burn in terms of his out-of-pocket max which he should be meeting soon given the IV antibiotics, MRIs etc. I have asked him to check in on this. We are using silver collagen in the meantime the wounds look better 2/10;  tolerating IV vancomycin and Rocephin. We are waiting to apply for Oasis. Although I am not really sure where  he is in his out-of-pocket max. 2/17 started the first application of Oasis trilayer. Still on antibiotics. The wounds have generally look better. The area on the left has a little more surface slough requiring debridement [id number]; second application of Oasis trilayer. The wound surface granulation is generally look better. The area on the left with undermining laterally I think is come in a bit. 10/08/2020 upon evaluation today patient is here today for [employer] application #3. Fortunately he seems to be doing extremely well with regard to this and we are seeing a lot of new epithelial growth which is great news. Fortunately there is no signs of active infection at this time. 10/16/2020 upon evaluation today patient appears to be doing well with regard to his foot ulcers. Do believe the Oasis has been of benefit for him. I do not see any signs of infection right now which is great news and I think that he has a lot of new epithelial growth which is great to see as well. The patient is very pleased to hear all of this. I do think we can proceed with the Oasis trilayer #4 today. 3/18; not as much improvement in these areas on his heels that I was hoping. I did reapply trilateral Oasis today the tissue looks healthier but not as much fill in as I was hoping. 3/25; better looking today I think this is come in a bit the tissue looks healthier. Triple layer Oasis reapplied #6 4/1; somewhat better looking definitely better looking surface not as much change in surface area as I was hoping. He may be spending more time [place] on days then he needs to although he does have heel offloading boots. Triple layer Oasis reapplied #7 4/7; unfortunately apparently [facility] will not approve any further Oasis which is unfortunate since the patient did respond nicely both in terms of the condition  of the wound bed as well as surface area. There is still some drainage coming from the wound but not a lot there does not appear to be any infection 4/15; we have been using Hydrofera Blue. He continues to have nice rims of epithelialization on the right greater than the left. The left the epithelialization is coming from the tip of his heel. There is moderate drainage. In this that concerns me about a total contact cast. There is no evidence of infection 4/29; patient has been using Hydrofera Blue with dressing changes. He has no complaints or issues today. 5/5; using Hydrofera Blue. I actually think that he looks marginally better than the last time I saw this 3 weeks ago. There are rims of epithelialization on the left thumb coming from the medial side on the right. Using Hydrofera Blue 5/12; using Hydrofera Blue. These continue to make improvements in surface area. His drainage was not listed as severe I therefore went ahead and put a cast on the left foot. Right foot we will continue to dress his previous 5/16; back for first total contact cast change. He did not tolerate this particularly well cast injury on the anterior tibia among other issues. Difficulty sleeping. I talked him about this in some detail and afterwards is elected to continue. I told him I would like to have a cast on for 3 weeks to see if this is going to help at all. I think he agreed 5/19; I think the wound is better. There is no tunneling towards his midfoot. The undermining medially also looks better. He has a rim  of new skin distally. I think we are making progress here. The area on the left also continues to look somewhat better to me using Hydrofera Blue. He has a list of complaints about the cast but none of them seem serious 5/26; patient presents for 1 week follow-up. He has been using a total contact cast and tolerating this well. Hydrofera Blue is the main dressing used. He denies signs of infection. 6/2 Hydrofera  Blue total contact cast on the left. These were large ulcers that formed in intensive care unit where the patient was recovering from [place]. May have had something to do with being ventilated in an upright positiono Pressors etc. We have been able to get the areas down considerably and a viable surface. There is some epithelialization in both sides. Note made of drainage 6/9; changed to [facility] last time because of drainage. He arrives with better looking surfaces and dimensions on the left than the right. Paradoxically the right actually probes more towards his midfoot the left is largely close down but both of these look improved. Using a total contact cast on the left 6/16; complex wounds on his bilateral plantar heels which were initially pressure injury from a stay in the ICU with COVID. We have been using silver alginate most recently. His dimensions of come in quite dramatically however not recently. We have been putting the left foot in a total contact cast 6/23; complex wounds on the bilateral plantar heels. I been putting the left in the cast paradoxically the area on the right is the one that is going towards closure at a faster rate. Quite a bit of drainage on the left. The patient went to see Dr. Amalia Hailey who said he was going to standby for skin grafts. I had actually considered sending him for skin grafts however he would be mandatorily off his feet for a period of weeks to months. I am thinking that the area on the right is going to close on its own the area on the left has been more stubborn even though we have him in a total contact cast 6/30; took him out of a total contact cast last week is the right heel seem to be making better progress than the left where I was placing the cast. We are using silver alginate. Both wounds are smaller right greater than left 7/12; both wounds look as though they are making some progress. We are using silver alginate. Heel offloading boots 7/26;  very gradual progress especially on the right. Using silver alginate. He is wearing heel offloading boots 8/18; he continues to close these wounds down very gradually. Using silver alginate. The problem polymen being definitive about this is areas of what appears to be callus around the margins. This is not a 100% of the area but certainly sizable especially on the right 9/1; bilateral plantar feet wounds secondary to prolonged pressure while being ventilated for COVID-19 in an upright position. Essentially pressure ulcers on the bottom of his feet. He is made substantial progress using silver alginate. 9/14; bilateral plantar feet wounds secondary to prolonged pressure. Making progress using silver alginate. 9/29 bilateral plantar feet wounds secondary to prolonged pressure. I changed him to Iodoflex last week. MolecuLight showing reddened blush fluorescence 10/11; patient presents for follow-up. He has no issues or complaints today. He denies signs of infection. He continues to use Iodoflex and antibiotic ointment to the wound beds. 10/27; 2-week follow-up. No evidence of infection. He has callus and thick dry  skin around the wound margins we have been using Iodoflex and Bactroban which was in response to a moderate left MolecuLight reddish blush fluorescence. 11/10; 2-week follow-up. Wound margins again have thick callus however the measurements of the actual wound sites are a lot smaller. Everything looks reasonably healthy here. We have been using Iodoflex He was approved for prime matrix but I have elected to delay this given the improvement in the surface area. Hopefully I will not regret that decision as were getting close to the end of the year in terms of insurance payment 12/8; 2-week follow-up. Wounds are generally smaller in size. These were initially substantial wounds extending into the forefoot all the way into the heel on the bilateral plantar feet. They are now both located on the  plantar heel distal aspect both of these have a lot of callus around the wounds I used a #5 curette to remove this on the right and the left also some subcutaneous debris to try and get the wound edges were using Iodoflex. He has heel offloading shoe 12/22; 2-week follow-up. Not really much improvement. He has thick callus around the outer edges of both wounds. I remove this there is some nonviable subcutaneous tissue as well. We have been using Iodoflex. Her intake nurse and myself spontaneously thought of a total contact cast I went back in May. At that time we really were not seeing much of an improvement with a cast although the wound was in a much different situation I would like to retry this in 2 weeks and I discussed this with the patient 08/12/2021; the patient has had some improvement with the Iodoflex. The the area on the left heel plantar more improved than the right. I had to put him in a total contact cast on the left although I decided to put that off for 2 weeks. I am going to change his primary dressing to silver collagen. I think in both areas he has had some improvement most of the healing seems to be more proximal in the heel. The wounds are in the mid aspect. A lot of thick callus on the right heel however. 1/19; we are using silver collagen on both plantar heel areas. He has had some improvement today. The left did not require any debridement. He still had some eschar on the right that was debrided but both seem to have contracted. I did not put it total contact cast on him today 2/2 we have been using silver collagen. The area on the right plantar heel has areas that appear to be epithelialized interspersed with dry flaking callus and dry skin. I removed this. This really looks better than on the other side. On the left still a large area with raised edges and debris on the surface. The patient states he is in the heel offloading boots for a prolonged period of time and really does  not use any other footwear 2/6; patient presents for first cast exchange. He has no issues or complaints today. 2/9; not much change in the left foot wound with 1 week of a cast we are using silver collagen. Silver collagen on the right side. The right side has been the better wound surface. We will reapply the total contact cast on the left 2/16; not much improvement on either side I been using silver collagen with a total contact cast on the left. I'm changing the Hydrofera Blue still with a total contact cast on the left 2/23; some improvement on both  sides. Disappointing that he has thick callus around the area that we are putting in a total contact cast on the left. We've been using Hydrofera Blue on both wound areas. This is a man who at essentially pressure ulcers in addition to ischemia caused by medications to support his blood pressure (pressors) in the ICU. He was being ventilated in the standing position for severe Covid. A [place] the wounds extended across his entire foot but are now localized to his plantar heels bilaterally. We have made progress however neither areas healed. I continue to think the total contact cast is helped albeit painstakingly slowly. He has never wanted a plastic surgery consult although I don't know that they would be interested in grafting in area in this location. 10/07/2021: Continued improvement bilaterally. There is still some callus around the left wound, despite the total contact cast. He has some increased pain in his right midfoot around 1 particular area. This has been painful in the past but seems to be a little bit worse. When his cast was removed today, there was an area on the heel of the left foot that looks a bit macerated. He is also complaining of pain in his left thigh and hip which he thinks is secondary to the limb length discrepancy caused by the cast. 10/14/2021: He continues to improve. A little bit less callus around the left wound. He  continues to endorse pain in his right midfoot, but this is not as significant as it was last week. The maceration on his left heel is improved. 10/21/2021: Continued improvement to both wounds. The maceration on his left heel is no longer evident. Less callus bilaterally. Epithelialization progressing. 10/28/2021: Significant improvement this week. The right sided wound is nearly closed with just a small open area at the middle. No maceration seen on the left heel. Continued epithelialization on both sides. No concern for infection. 11/04/2021: T oday, the wounds were measured a little bit differently and come out as larger, but I actually think they are about the same to potentially even smaller, particularly on the left. He continues to accumulate some callus on the right. 11/11/2021: T oday, the patient is expressing some concern that the left wound, despite being in the total contact cast, is not progressing at the same rate as the 1 on the right. He is interested in trying a week without the cast to see how the wound does. The wounds are roughly the same size as last week, with the right perhaps being a little bit smaller. He continues to build up callus on both sites. 11/18/2021: Last week, I permitted the patient to go without his total contact cast, just to see if the cast was really making any difference. Today, both wounds have deteriorated to some extent, suggesting that the cast is providing benefit, at least on the left. Both are larger and have accumulated callus, slough, and other debris. 11/26/2021: I debrided both wounds quite aggressively last week in an effort to stimulate the healing cascade. This appears to have been effective as the left sided wound is a full centimeter shorter in length. Although the right was measured slightly larger, on inspection, it looks as though an area of epithelialized tissue was included in the measurements. We have been using PolyMem Ag on the wound  surfaces with a total contact cast to the left. 12/02/2021: It appears that the intake personnel are including epithelialized tissue in his wound measurements; the right wound is almost completely epithelialized; there  is just a crater at the proximal midfoot with some open areas. On the left, he has built up some callus, but the overall wound surface looks good. There is some senescent skin around the wound margin. He has been in PolyMem Ag bilaterally with a total contact cast on the left. 12/09/2021: The right wound is nearly closed; there is just a small open area at the mid calcaneus. On the left, the wound is smaller with minimal callus buildup. No significant drainage. 12/16/2021: The right calcaneal wound remains minimally open at the mid calcaneus; the rest has epithelialized. On the left, the wound is also a little bit smaller. There is some senescent tissue on the periphery. He is getting his first application of a trial skin substitute called Vendaje today. 12/23/2021: The wound on his right calcaneus is nearly closed; there is just a small area at the most distal aspect of the calcaneus that is open. On the left, the area where we applied to the skin substitute has a healthier-looking bed of granulation tissue. The wound dimensions are not significantly different on this side but the wound surface is improved. 12/30/2021: The wound on the right calcaneus has not changed significantly aside from some accumulation of callus. On the left, the open area is smaller and continues to have an improved surface. He continues to accumulate callus around the wound. He is here for his third application of Vendaje. 01/06/2022: The right calcaneal wound is down to just a couple of millimeters. He continues to accumulate periwound callus. He unfortunately got his cast wet earlier in the week and his left foot is macerated, resulting in some superficial skin loss just distal to the open wound. The open wound  itself, however, is much smaller and has a healthier appearing surface. He is here for his fourth application of Vendaje. 01/13/2022: The right calcaneal wound is about the same. Unfortunately, once again, his cast got wet and his foot is again macerated. This is caused the left calcaneal wound to enlarge. He is here for his fifth application of Vendaje. 01/20/2022: The right calcaneal wound is very small. There is some periwound callus accumulation. He purchased a new cast protector last week and this has been effective in avoiding the maceration that has been occurring on the left. The left calcaneal wound is narrower and has a healthy and viable-appearing surface. He is here for his 6 application of Vendaje. 01/27/2022: The right calcaneal wound is down to just a pinhole. There is some periwound slough and callus. On the left, the wound is narrower and shorter by about a centimeter. The surface is robust and viable-appearing. Unfortunately, the rep for the trial skin substitute product did not provide any for [place] to use today. 02/04/2022: The right calcaneal wound remains unchanged. There is more accumulated callus. On the left, although the intake nurse measured it a little bit longer, it looks about the same to me. The surface has a layer of slough, but underneath this, there is good granulation tissue. 02/10/2022: The right calcaneus wound is nearly closed. There is still some callus that builds up around the site. The left side looks about the same in terms of dimensions, but the surface is more robust and vital-appearing. 02/16/2022: The area of the right calcaneus that was nearly closed last week has closed, but there is a small opening at the mid foot where it looks like some moisture got retained and caused some reopening. The left foot wound is narrower and shallower.  Both sites have a fair amount of periwound callus and eschar. 02/24/2022: The small midfoot opening on the right calcaneus is a  little bit smaller today. The left foot wound is narrower and shallower. He continues to accumulate periwound callus. No concern for infection. 03/01/2022: The patient came to clinic early because he showered and got his cast wet. Fortunately, there is no significant maceration to his foot but the callus softened and it looks like the wound on his left calcaneus may be a little bit wider. The wound on his right calcaneus is just a narrow slit. Continued accumulation of periwound callus bilaterally. Electronic Signature(s) Signed: 03/01/2022 11:16:13 AM By: Fredirick Maudlin MD FACS Entered By: Fredirick Maudlin on 03/01/2022 11:16:13 -------------------------------------------------------------------------------- Physical Exam Details Patient Name: Date of Service: [name], [place] 03/01/2022 10:00 A M Medical Record Number: [id number] Patient Account Number: [id number] Date of Birth/Sex: Treating RN: 05-28-1976 (46 y.o. [name] Primary Care Provider: [name] Other Clinician: Referring Provider: Treating Provider/Extender: [name] in Treatment: 72 Constitutional Slightly hypertensive. . . . No acute distress.Marland Kitchen Respiratory . Notes 03/01/2022: The cast got wet, but there is no significant maceration to his foot. The callus softened and it looks like the wound on his left calcaneus may be a little bit wider. The wound on his right calcaneus is just a narrow slit. Continued accumulation of periwound callus bilaterally. Electronic Signature(s) Signed: 03/01/2022 11:27:57 AM By: Fredirick Maudlin MD FACS Entered By: Fredirick Maudlin on 03/01/2022 11:27:57 -------------------------------------------------------------------------------- Physician Orders Details Patient Name: Date of Service: [name], [place] 03/01/2022 10:00 Agency Record Number: [id number] Patient Account Number: [id number] Date of Birth/Sex: Treating RN: 1976-04-13 (46 y.o. [name] Primary Care Provider: [name] Other Clinician: Referring Provider: Treating Provider/Extender: [name] in Treatment: 17 Verbal / Phone Orders: No Diagnosis Coding ICD-10 Coding Code Description L97.512 Non-pressure chronic ulcer of other part of right foot with fat layer exposed L97.522 Non-pressure chronic ulcer of other part of left foot with fat layer exposed Follow-up Appointments ppointment in 1 week. - Dr [name] - Room 3 Return A Tuesday 8/1 @ 08:45 am Cellular or Tissue Based Products Wound #1 Right Calcaneus Cellular or Tissue Based Product Type: - Vendaje #11(trial) Cellular or Tissue Based Product applied to wound bed, secured with steri-strips, cover with Adaptic or Mepitel. (DO NOT REMOVE). Wound #2 Left Calcaneus Cellular or Tissue Based Product Type: - Vendaje #11(trial) Cellular or Tissue Based Product applied to wound bed, secured with steri-strips, cover with Adaptic or Mepitel. (DO NOT REMOVE). Bathing/ Shower/ Hygiene May shower with protection but do not get wound dressing(s) wet. - Cover with cast protector (can purchase cast protector at [facility] or [facility] ) Edema Control - Lymphedema / SCD / Other Bilateral Lower Extremities Elevate legs to the level of the heart or above for 30 minutes daily and/or when sitting, a frequency of: - throughout the day Avoid standing for long periods of time. Moisturize legs daily. - right leg and foot every night. Off-Loading Total Contact Cast to Left Lower Extremity Wedge shoe to: - Globoped Shoe to right foot Other: - keep pressure off of the bottom of your feet Additional Orders / Instructions Follow Nutritious Diet Wound Treatment Wound #1 - Calcaneus Wound Laterality: Right Cleanser: Normal Saline (Generic) 1 x Per Week/30 Days Discharge Instructions: Cleanse the wound with Normal Saline prior to applying a clean dressing using gauze sponges, not tissue or cotton  balls. Cleanser: Wound Cleanser (Generic) 1 x Per Week/30 Days Discharge Instructions: Cleanse the wound with wound cleanser prior to applying a clean dressing using gauze sponges, not tissue or cotton balls. Prim Dressing: VENDAJE ary 1 x Per Week/30 Days Secondary Dressing: Optifoam Non-Adhesive Dressing, 4x4 in 1 x Per Week/30 Days Discharge Instructions: Apply over primary dressing cut to make foam border Secondary Dressing: Woven Gauze Sponge, Non-Sterile 4x4 in 1 x Per Week/30 Days Discharge Instructions: Apply over primary dressing as directed. Secured With: Elastic Bandage 4 inch (ACE bandage) (Generic) 1 x Per Week/30 Days Discharge Instructions: Secure with ACE bandage as directed. Secured With: [employer], 4.5x3.1 (in/yd) (Generic) 1 x Per Week/30 Days Discharge Instructions: Secure with Kerlix as directed. Wound #2 - Calcaneus Wound Laterality: Left Cleanser: Normal Saline (Generic) 1 x Per Week/30 Days Discharge Instructions: Cleanse the wound with Normal Saline prior to applying a clean dressing using gauze sponges, not tissue or cotton balls. Cleanser: Wound Cleanser 1 x Per Week/30 Days Discharge Instructions: Cleanse the wound with wound cleanser prior to applying a clean dressing using gauze sponges, not tissue or cotton balls. Peri-Wound Care: Zinc Oxide Ointment 30g tube 1 x Per Week/30 Days Discharge Instructions: Apply Zinc Oxide to periwound with each dressing change Prim Dressing: VENDAJE ary 1 x Per Week/30 Days Secondary Dressing: Optifoam Non-Adhesive Dressing, 4x4 in (Generic) 1 x Per Week/30 Days Discharge Instructions: Apply over primary dressing as directed. Secondary Dressing: Woven Gauze Sponge, Non-Sterile 4x4 in 1 x Per Week/30 Days Discharge Instructions: Apply over primary dressing as directed. Secured With: 32M Medipore H Soft Cloth Surgical T ape, 4 x 10 (in/yd) (Generic) 1 x Per Week/30 Days Discharge Instructions: Secure with tape as  directed. Electronic Signature(s) Signed: 03/01/2022 12:52:06 PM By: Fredirick Maudlin MD FACS Entered By: Fredirick Maudlin on 03/01/2022 11:28:15 -------------------------------------------------------------------------------- Problem List Details Patient Name: Date of Service: [name], [place] 03/01/2022 10:00 A M Medical Record Number: [id number] Patient Account Number: [id number] Date of Birth/Sex: Treating RN: [birth year] (46 y.o. [name] Primary Care Provider: [name] Other Clinician: Referring Provider: Treating Provider/Extender: [name] in Treatment: 37 Active Problems ICD-10 Encounter Code Description Active Date MDM Diagnosis L97.512 Non-pressure chronic ulcer of other part of right foot with fat layer exposed 09/03/2020 No Yes L97.522 Non-pressure chronic ulcer of other part of left foot with fat layer exposed 09/03/2020 No Yes Inactive Problems ICD-10 Code Description Active Date Inactive Date L89.893 Pressure ulcer of other site, stage 3 07/15/2020 07/15/2020 M62.81 Muscle weakness (generalized) 07/15/2020 07/15/2020 I10 Essential (primary) hypertension 07/15/2020 07/15/2020 M86.171 Other acute osteomyelitis, right ankle and foot 09/03/2020 09/03/2020 Resolved Problems Electronic Signature(s) Signed: 03/01/2022 11:14:24 AM By: Fredirick Maudlin MD FACS Entered By: Fredirick Maudlin on 03/01/2022 11:14:24 -------------------------------------------------------------------------------- Progress Note Details Patient Name: Date of Service: [name], [place] 03/01/2022 10:00 [place] Record Number: [id number] Patient Account Number: [id number] Date of Birth/Sex: Treating RN: 06/08/1976 (46 y.o. [name] Primary Care Provider: [name] Other Clinician: Referring Provider: Treating Provider/Extender: [name] in Treatment: 67 Subjective Chief Complaint Information obtained from  Patient Bilateral Plantar Foot Ulcers History of Present Illness (HPI) Wounds are12/03/2020 upon evaluation today patient presents for initial inspection here in our clinic concerning issues he has been having with the bottoms of his feet bilaterally. He states these actually occurred as wounds when he was hospitalized for 5 months secondary to Covid. He was apparently with tilting bed where  he was in an upright position quite frequently and apparently this occurred in some way shape or form during that time. Fortunately there is no sign of active infection at this time. No fevers, chills, nausea, vomiting, or diarrhea. With that being said he still has substantial wounds on the plantar aspects of his feet Theragen require quite a bit of work to get these to heal. He has been using Santyl currently though that is been problematic both in receiving the medication as well as actually paid for it as it is become quite expensive. Prior to the experience with Covid the patient really did not have any major medical problems other than hypertension he does have some mild generalized weakness following the Covid experience. 07/22/2020 on evaluation today patient appears to be doing okay in regard to his foot ulcers I feel like the wound beds are showing signs of better improvement that I do believe the Iodoflex is helping in this regard. With that being said he does have a lot of drainage currently and this is somewhat blue/green in nature which is consistent with Pseudomonas. I do think a culture today would be appropriate for [place] to evaluate and see if that is indeed the case I would likely start him on antibiotic orally as well he is not allergic to Cipro knows of no issues he has had in the past 12/21; patient was admitted to the clinic earlier this month with bilateral presumed pressure ulcers on the bottom of his feet apparently related to excessive pressure from a tilt table arrangement in the intensive  care unit. Patient relates this to being on ECMO but I am not really sure that is exactly related to that. I must say I have never seen anything like this. He has fairly extensive full-thickness wounds extending from his heel towards his midfoot mostly centered laterally. There is already been some healing distally. He does not appear to have an arterial issue. He has been using gentamicin to the wound surfaces with Iodoflex to help with ongoing debridement 1/6; this is a patient with pressure ulcers on the bottom of his feet related to excessive pressure from a standing position in the intensive care unit. He is complaining of a lot of pain in the right heel. He is not a diabetic. 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 We have been using Iodoflex to help prepare the surfaces of both wounds for an advanced treatment product. He is nonambulatory spending most of his time in a wheelchair I have asked him not to propel the wheelchair with his heels 1/13; in general his wounds look better not much surface area change we have been using Iodoflex as of last week. I did an x-ray of the right heel as the patient was complaining of pain. I had some thoughts about a stress fracture perhaps Achilles tendon problems however what it showed was erosive changes along the inferior aspect of the calcaneus he now has a MRI booked for 1/20. 1/20; in general his wounds continue to be better. Some improvement in the large narrow areas proximally in his foot. He is still complaining of pain in the right heel and tenderness in certain areas of this wound. His MRI is tonight. I am not just looking for osteomyelitis that was brought up on the x-ray I am wondering about stress fractures, tendon ruptures etc. He has no such findings on the left. Also noteworthy is that the patient had critical illness neuropathy and some  of the discomfort may be actual improvement in nerve function I am just not  sure. These wounds were initially in the setting of severe critical illness related to COVID-19. He was put in a standing position. He may have also been on pressors at the point contributing to tissue ischemia. By his description at some point these wounds were grossly necrotic extending proximally up into the Achilles part of his heel. I do not know that I have ever really seen pictures of them like this although they may exist in epic We have ordered Tri layer Oasis. I am trying to stimulate some granulation in these areas. This is of course assuming the MRI is negative for infection 1/27; since the patient was last here he saw Dr. Juleen China of infectious disease. He is planned for vancomycin and ceftriaxone. Prior operative culture grew MSSA. Also ordered baseline lab work. He also ordered arterial studies although the ABIs in our clinic were normal as well as his clinical exam these were normal I do not think he needs to see vascular surgery. His ABIs at the [facility] were 1.22 in the right triphasic waveforms with a normal TBI of 1.15 on the left ABI of 1.22 with triphasic waveforms and a normal TBI of 1.08. Finally he saw Dr. Amalia Hailey who will follow him in for 2 months. At this point I do not think he felt that he needed a procedure on the right calcaneal bone. Dr. Juleen China is elected for broad-spectrum antibiotic The patient is still having pain in the right heel. He walks with a walker 2/3; wounds are generally smaller. He is tolerating his IV antibiotics. I believe this is vancomycin and ceftriaxone. We are still waiting for Oasis burn in terms of his out-of-pocket max which he should be meeting soon given the IV antibiotics, MRIs etc. I have asked him to check in on this. We are using silver collagen in the meantime the wounds look better 2/10; tolerating IV vancomycin and Rocephin. We are waiting to apply for Oasis. Although I am not really sure where he is in his out-of-pocket max. 2/17 started  the first application of Oasis trilayer. Still on antibiotics. The wounds have generally look better. The area on the left has a little more surface slough requiring debridement [id number]; second application of Oasis trilayer. The wound surface granulation is generally look better. The area on the left with undermining laterally I think is come in a bit. 10/08/2020 upon evaluation today patient is here today for [employer] application #3. Fortunately he seems to be doing extremely well with regard to this and we are seeing a lot of new epithelial growth which is great news. Fortunately there is no signs of active infection at this time. 10/16/2020 upon evaluation today patient appears to be doing well with regard to his foot ulcers. Do believe the Oasis has been of benefit for him. I do not see any signs of infection right now which is great news and I think that he has a lot of new epithelial growth which is great to see as well. The patient is very pleased to hear all of this. I do think we can proceed with the Oasis trilayer #4 today. 3/18; not as much improvement in these areas on his heels that I was hoping. I did reapply trilateral Oasis today the tissue looks healthier but not as much fill in as I was hoping. 3/25; better looking today I think this is come in a bit the tissue  looks healthier. Triple layer Oasis reapplied #6 4/1; somewhat better looking definitely better looking surface not as much change in surface area as I was hoping. He may be spending more time [place] on days then he needs to although he does have heel offloading boots. Triple layer Oasis reapplied #7 4/7; unfortunately apparently [facility] will not approve any further Oasis which is unfortunate since the patient did respond nicely both in terms of the condition of the wound bed as well as surface area. There is still some drainage coming from the wound but not a lot there does not appear to be any infection 4/15;  we have been using Hydrofera Blue. He continues to have nice rims of epithelialization on the right greater than the left. The left the epithelialization is coming from the tip of his heel. There is moderate drainage. In this that concerns me about a total contact cast. There is no evidence of infection 4/29; patient has been using Hydrofera Blue with dressing changes. He has no complaints or issues today. 5/5; using Hydrofera Blue. I actually think that he looks marginally better than the last time I saw this 3 weeks ago. There are rims of epithelialization on the left thumb coming from the medial side on the right. Using Hydrofera Blue 5/12; using Hydrofera Blue. These continue to make improvements in surface area. His drainage was not listed as severe I therefore went ahead and put a cast on the left foot. Right foot we will continue to dress his previous 5/16; back for first total contact cast change. He did not tolerate this particularly well cast injury on the anterior tibia among other issues. Difficulty sleeping. I talked him about this in some detail and afterwards is elected to continue. I told him I would like to have a cast on for 3 weeks to see if this is going to help at all. I think he agreed 5/19; I think the wound is better. There is no tunneling towards his midfoot. The undermining medially also looks better. He has a rim of new skin distally. I think we are making progress here. The area on the left also continues to look somewhat better to me using Hydrofera Blue. He has a list of complaints about the cast but none of them seem serious 5/26; patient presents for 1 week follow-up. He has been using a total contact cast and tolerating this well. Hydrofera Blue is the main dressing used. He denies signs of infection. 6/2 Hydrofera Blue total contact cast on the left. These were large ulcers that formed in intensive care unit where the patient was recovering from [place]. May have had  something to do with being ventilated in an upright positiono Pressors etc. We have been able to get the areas down considerably and a viable surface. There is some epithelialization in both sides. Note made of drainage 6/9; changed to [facility] last time because of drainage. He arrives with better looking surfaces and dimensions on the left than the right. Paradoxically the right actually probes more towards his midfoot the left is largely close down but both of these look improved. Using a total contact cast on the left 6/16; complex wounds on his bilateral plantar heels which were initially pressure injury from a stay in the ICU with COVID. We have been using silver alginate most recently. His dimensions of come in quite dramatically however not recently. We have been putting the left foot in a total contact cast 6/23;  complex wounds on the bilateral plantar heels. I been putting the left in the cast paradoxically the area on the right is the one that is going towards closure at a faster rate. Quite a bit of drainage on the left. The patient went to see Dr. Amalia Hailey who said he was going to [facility] for skin grafts. I had actually considered sending him for skin grafts however he would be mandatorily off his feet for a period of weeks to months. I am thinking that the area on the right is going to close on its own the area on the left has been more stubborn even though we have him in a total contact cast 6/30; took him out of a total contact cast last week is the right heel seem to be making better progress than the left where I was placing the cast. We are using silver alginate. Both wounds are smaller right greater than left 7/12; both wounds look as though they are making some progress. We are using silver alginate. Heel offloading boots 7/26; very gradual progress especially on the right. Using silver alginate. He is wearing heel offloading boots 8/18; he continues to close these wounds down  very gradually. Using silver alginate. The problem polymen being definitive about this is areas of what appears to be callus around the margins. This is not a 100% of the area but certainly sizable especially on the right 9/1; bilateral plantar feet wounds secondary to prolonged pressure while being ventilated for COVID-19 in an upright position. Essentially pressure ulcers on the bottom of his feet. He is made substantial progress using silver alginate. 9/14; bilateral plantar feet wounds secondary to prolonged pressure. Making progress using silver alginate. 9/29 bilateral plantar feet wounds secondary to prolonged pressure. I changed him to Iodoflex last week. MolecuLight showing reddened blush fluorescence 10/11; patient presents for follow-up. He has no issues or complaints today. He denies signs of infection. He continues to use Iodoflex and antibiotic ointment to the wound beds. 10/27; 2-week follow-up. No evidence of infection. He has callus and thick dry skin around the wound margins we have been using Iodoflex and Bactroban which was in response to a moderate left MolecuLight reddish blush fluorescence. 11/10; 2-week follow-up. Wound margins again have thick callus however the measurements of the actual wound sites are a lot smaller. Everything looks reasonably healthy here. We have been using Iodoflex He was approved for prime matrix but I have elected to delay this given the improvement in the surface area. Hopefully I will not regret that decision as were getting close to the end of the year in terms of insurance payment 12/8; 2-week follow-up. Wounds are generally smaller in size. These were initially substantial wounds extending into the forefoot all the way into the heel on the bilateral plantar feet. They are now both located on the plantar heel distal aspect both of these have a lot of callus around the wounds I used a #5 curette to remove this on the right and the left also some  subcutaneous debris to try and get the wound edges were using Iodoflex. He has heel offloading shoe 12/22; 2-week follow-up. Not really much improvement. He has thick callus around the outer edges of both wounds. I remove this there is some nonviable subcutaneous tissue as well. We have been using Iodoflex. Her intake nurse and myself spontaneously thought of a total contact cast I went back in May. At that time we really were not seeing much of an improvement  with a cast although the wound was in a much different situation I would like to retry this in 2 weeks and I discussed this with the patient 08/12/2021; the patient has had some improvement with the Iodoflex. The the area on the left heel plantar more improved than the right. I had to put him in a total contact cast on the left although I decided to put that off for 2 weeks. I am going to change his primary dressing to silver collagen. I think in both areas he has had some improvement most of the healing seems to be more proximal in the heel. The wounds are in the mid aspect. A lot of thick callus on the right heel however. 1/19; we are using silver collagen on both plantar heel areas. He has had some improvement today. The left did not require any debridement. He still had some eschar on the right that was debrided but both seem to have contracted. I did not put it total contact cast on him today 2/2 we have been using silver collagen. The area on the right plantar heel has areas that appear to be epithelialized interspersed with dry flaking callus and dry skin. I removed this. This really looks better than on the other side. On the left still a large area with raised edges and debris on the surface. 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 He has no issues or complaints today. 2/9; not much change in the left foot  wound with 1 week of a cast we are using silver collagen. Silver collagen on the right side. The right side has been the better wound surface. We will reapply the total contact cast on the left 2/16; not much improvement on either side I been using silver collagen with a total contact cast on the left. I'm changing the Hydrofera Blue still with a total contact cast on the left 2/23; some improvement on both sides. Disappointing that he has thick callus around the area that we are putting in a total contact cast on the left. We've been using Hydrofera Blue on both wound areas. This is a man who at essentially pressure ulcers in addition to ischemia caused by medications to support his blood pressure (pressors) in the ICU. He was being ventilated in the standing position for severe Covid. A Shiley the wounds extended across his entire foot but are now localized to his plantar heels bilaterally. We have made progress however neither areas healed. I continue to think the total contact cast is helped albeit painstakingly slowly. He has never wanted a plastic surgery consult although I don't know that they would be interested in grafting in area in this location. 10/07/2021: Continued improvement bilaterally. There is still some callus around the left wound, despite the total contact cast. He has some increased pain in his right midfoot around 1 particular area. This has been painful in the past but seems to be a little bit worse. When his cast was removed today, there was an area on the heel of the left foot that looks a bit macerated. He is also complaining of pain in his left thigh and hip which he thinks is secondary to the limb length discrepancy caused by the cast. 10/14/2021: He continues to improve. A little bit less callus around the left wound. He continues to endorse pain in his  right midfoot, but this is not as significant as it was last week. The maceration on his left heel is improved. 10/21/2021:  Continued improvement to both wounds. The maceration on his left heel is no longer evident. Less callus bilaterally. Epithelialization progressing. 10/28/2021: Significant improvement this week. The right sided wound is nearly closed with just a small open area at the middle. No maceration seen on the left heel. Continued epithelialization on both sides. No concern for infection. 11/04/2021: T oday, the wounds were measured a little bit differently and come out as larger, but I actually think they are about the same to potentially even smaller, particularly on the left. He continues to accumulate some callus on the right. 11/11/2021: T oday, the patient is expressing some concern that the left wound, despite being in the total contact cast, is not progressing at the same rate as the 1 on the right. He is interested in trying a week without the cast to see how the wound does. The wounds are roughly the same size as last week, with the right perhaps being a little bit smaller. He continues to build up callus on both sites. 11/18/2021: Last week, I permitted the patient to go without his total contact cast, just to see if the cast was really making any difference. Today, both wounds have deteriorated to some extent, suggesting that the cast is providing benefit, at least on the left. Both are larger and have accumulated callus, slough, and other debris. 11/26/2021: I debrided both wounds quite aggressively last week in an effort to stimulate the healing cascade. This appears to have been effective as the left sided wound is a full centimeter shorter in length. Although the right was measured slightly larger, on inspection, it looks as though an area of epithelialized tissue was included in the measurements. We have been using PolyMem Ag on the wound surfaces with a total contact cast to the left. 12/02/2021: It appears that the intake personnel are including epithelialized tissue in his wound measurements; the  right wound is almost completely epithelialized; there is just a crater at the proximal midfoot with some open areas. On the left, he has built up some callus, but the overall wound surface looks good. There is some senescent skin around the wound margin. He has been in PolyMem Ag bilaterally with a total contact cast on the left. 12/09/2021: The right wound is nearly closed; there is just a small open area at the mid calcaneus. On the left, the wound is smaller with minimal callus buildup. No significant drainage. 12/16/2021: The right calcaneal wound remains minimally open at the mid calcaneus; the rest has epithelialized. On the left, the wound is also a little bit smaller. There is some senescent tissue on the periphery. He is getting his first application of a trial skin substitute called Vendaje today. 12/23/2021: The wound on his right calcaneus is nearly closed; there is just a small area at the most distal aspect of the calcaneus that is open. On the left, the area where we applied to the skin substitute has a healthier-looking bed of granulation tissue. The wound dimensions are not significantly different on this side but the wound surface is improved. 12/30/2021: The wound on the right calcaneus has not changed significantly aside from some accumulation of callus. On the left, the open area is smaller and continues to have an improved surface. He continues to accumulate callus around the wound. He is here for his third application of Vendaje. 01/06/2022:  The right calcaneal wound is down to just a couple of millimeters. He continues to accumulate periwound callus. He unfortunately got his cast wet earlier in the week and his left foot is macerated, resulting in some superficial skin loss just distal to the open wound. The open wound itself, however, is much smaller and has a healthier appearing surface. He is here for his fourth application of Vendaje. 01/13/2022: The right calcaneal wound is about  the same. Unfortunately, once again, his cast got wet and his foot is again macerated. This is caused the left calcaneal wound to enlarge. He is here for his fifth application of Vendaje. 01/20/2022: The right calcaneal wound is very small. There is some periwound callus accumulation. He purchased a new cast protector last week and this has been effective in avoiding the maceration that has been occurring on the left. The left calcaneal wound is narrower and has a healthy and viable-appearing surface. He is here for his 6 application of Vendaje. 01/27/2022: The right calcaneal wound is down to just a pinhole. There is some periwound slough and callus. On the left, the wound is narrower and shorter by about a centimeter. The surface is robust and viable-appearing. Unfortunately, the rep for the trial skin substitute product did not provide any for [place] to use today. 02/04/2022: The right calcaneal wound remains unchanged. There is more accumulated callus. On the left, although the intake nurse measured it a little bit longer, it looks about the same to me. The surface has a layer of slough, but underneath this, there is good granulation tissue. 02/10/2022: The right calcaneus wound is nearly closed. There is still some callus that builds up around the site. The left side looks about the same in terms of dimensions, but the surface is more robust and vital-appearing. 02/16/2022: The area of the right calcaneus that was nearly closed last week has closed, but there is a small opening at the mid foot where it looks like some moisture got retained and caused some reopening. The left foot wound is narrower and shallower. Both sites have a fair amount of periwound callus and eschar. 02/24/2022: The small midfoot opening on the right calcaneus is a little bit smaller today. The left foot wound is narrower and shallower. He continues to accumulate periwound callus. No concern for infection. 03/01/2022: The patient came  to clinic early because he showered and got his cast wet. Fortunately, there is no significant maceration to his foot but the callus softened and it looks like the wound on his left calcaneus may be a little bit wider. The wound on his right calcaneus is just a narrow slit. Continued accumulation of periwound callus bilaterally. Patient History Information obtained from Patient. Family History Cancer - Maternal Grandparents, Diabetes - Father,Paternal Grandparents, Heart Disease - Maternal Grandparents, Hypertension - Father,Paternal Grandparents, Lung Disease - Siblings, No family history of Hereditary Spherocytosis, Kidney Disease, Seizures, Stroke, Thyroid Problems, Tuberculosis. Social History Never smoker, Marital Status - Married, Alcohol Use - Never, Drug Use - No History, Caffeine Use - Daily - tea, soda. Medical History Eyes Denies history of Cataracts, Glaucoma, Optic Neuritis Ear/Nose/Mouth/Throat Denies history of Chronic sinus problems/congestion, Middle ear problems Hematologic/Lymphatic Denies history of Anemia, Hemophilia, Human Immunodeficiency Virus, Lymphedema, Sickle Cell Disease Respiratory Patient has history of Asthma Denies history of Aspiration, Chronic Obstructive Pulmonary Disease (COPD), Pneumothorax, Sleep Apnea, Tuberculosis Cardiovascular Patient has history of Angina - with COVID, Hypertension Denies history of Arrhythmia, Congestive Heart Failure, Coronary Artery Disease, Deep  Vein Thrombosis, Hypotension, Myocardial Infarction, Peripheral Arterial Disease, Peripheral Venous Disease, Phlebitis, Vasculitis Gastrointestinal Denies history of Cirrhosis , Colitis, Crohnoos, Hepatitis A, Hepatitis B, Hepatitis C Endocrine Denies history of Type I Diabetes, Type II Diabetes Genitourinary Denies history of End Stage Renal Disease Immunological Denies history of Lupus Erythematosus, Raynaudoos, Scleroderma Integumentary (Skin) Denies history of History of  Burn Musculoskeletal Denies history of Gout, Rheumatoid Arthritis, Osteoarthritis, Osteomyelitis Neurologic Denies history of Dementia, Neuropathy, Quadriplegia, Paraplegia, Seizure Disorder Oncologic Denies history of Received Chemotherapy, Received Radiation Psychiatric Denies history of Anorexia/bulimia, Confinement Anxiety Hospitalization/Surgery History - COVID PNA 07/22/2019- 11/14/2019. - 03/27/2020 wound debridement/ skin graft. Medical A Surgical History Notes nd Constitutional Symptoms (General Health) COVID PNA 07/22/2019-11/14/2019 [facility] ECMO, foot drop left foot , Genitourinary kidney stone Psychiatric anxiety Objective Constitutional Slightly hypertensive. No acute distress.. Vitals Time Taken: 10:33 AM, Height: 69 in, Weight: 280 lbs, BMI: 41.3, Temperature: 99 F, Pulse: 77 bpm, Respiratory Rate: 18 breaths/min, Blood Pressure: 143/73 mmHg. General Notes: 03/01/2022: The cast got wet, but there is no significant maceration to his foot. The callus softened and it looks like the wound on his left calcaneus may be a little bit wider. The wound on his right calcaneus is just a narrow slit. Continued accumulation of periwound callus bilaterally. Integumentary (Hair, Skin) Wound #1 status is Open. Original cause of wound was Pressure Injury. The date acquired was: 10/07/2019. The wound has been in treatment 84 weeks. The wound is located on the Right Calcaneus. The wound measures 0.7cm length x 0.2cm width x 0.3cm depth; 0.11cm^2 area and 0.033cm^3 volume. There is Fat Layer (Subcutaneous Tissue) exposed. There is no tunneling or undermining noted. There is a medium amount of serosanguineous drainage noted. The wound margin is thickened. There is medium (34-66%) pink granulation within the wound bed. There is a medium (34-66%) amount of necrotic tissue within the wound bed. Wound #2 status is Open. Original cause of wound was Pressure Injury. The date acquired was: 10/07/2019. The  wound has been in treatment 84 weeks. The wound is located on the Left Calcaneus. The wound measures 2.3cm length x 0.5cm width x 0.1cm depth; 0.903cm^2 area and 0.09cm^3 volume. There is Fat Layer (Subcutaneous Tissue) exposed. There is no tunneling or undermining noted. There is a medium amount of serosanguineous drainage noted. The wound margin is distinct with the outline attached to the wound base. There is medium (34-66%) red granulation within the wound bed. There is a medium (34-66%) amount of necrotic tissue within the wound bed including Adherent Slough. Assessment Active Problems ICD-10 Non-pressure chronic ulcer of other part of right foot with fat layer exposed Non-pressure chronic ulcer of other part of left foot with fat layer exposed Procedures Wound #1 Pre-procedure diagnosis of Wound #1 is a Pressure Ulcer located on the Right Calcaneus . There was a Selective/Open Wound Skin/Epidermis Debridement with a total area of 1.5 sq cm performed by [name], MD. With the following instrument(s): Curette to remove Non-Viable tissue/material. Material removed includes Callus, Slough, and Skin: Epidermis. No specimens were taken. A time out was conducted at 10:55, prior to the start of the procedure. A Minimum amount of bleeding was controlled with Pressure. The procedure was tolerated well with a pain level of 0 throughout and a pain level of 0 following the procedure. Post Debridement Measurements: 0.7cm length x 0.2cm width x 0.3cm depth; 0.033cm^3 volume. Post debridement Stage noted as Category/Stage III. Character of Wound/Ulcer Post Debridement is improved. Post procedure Diagnosis Wound #1:  Same as Pre-Procedure Pre-procedure diagnosis of Wound #1 is a Pressure Ulcer located on the Right Calcaneus. A skin graft procedure using a bioengineered skin substitute/cellular or tissue based product was performed by [name], MD with the following instrument(s): Forceps.  Other was applied and secured with Steri-Strips. 4 sq cm of product was utilized and 0 sq cm was wasted. Post Application, adaptic, gauze was applied. A Time Out was conducted at 11:00, prior to the start of the procedure. The procedure was tolerated well with a pain level of 0 throughout and a pain level of 0 following the procedure. Post procedure Diagnosis Wound #1: Same as Pre-Procedure General Notes: donated trial Vendaje. Wound #2 Pre-procedure diagnosis of Wound #2 is a Pressure Ulcer located on the Left Calcaneus. A skin graft procedure using a bioengineered skin substitute/cellular or tissue based product was performed by [name], MD with the following instrument(s): Forceps. Other was applied and secured with Steri-Strips. 4 sq cm of product was utilized and 0 sq cm was wasted. Post Application, adaptic, gauze was applied. A Time Out was conducted at 11:00, prior to the start of the procedure. The procedure was tolerated well with a pain level of 0 throughout and a pain level of 0 following the procedure. Post procedure Diagnosis Wound #2: Same as Pre-Procedure . Pre-procedure diagnosis of Wound #2 is a Pressure Ulcer located on the Left Calcaneus . There was a T Contact Cast Procedure by [name], MD. otal Post procedure Diagnosis Wound #2: Same as Pre-Procedure Plan Follow-up Appointments: Return Appointment in 1 week. - Dr Lady Gary - Room 3 Tuesday 8/1 @ 08:45 am Cellular or Tissue Based Products: Wound #1 Right Calcaneus: Cellular or Tissue Based Product Type: - Vendaje #11(trial) Cellular or Tissue Based Product applied to wound bed, secured with steri-strips, cover with Adaptic or Mepitel. (DO NOT REMOVE). Wound #2 Left Calcaneus: Cellular or Tissue Based Product Type: - Vendaje #11(trial) Cellular or Tissue Based Product applied to wound bed, secured with steri-strips, cover with Adaptic or Mepitel. (DO NOT REMOVE). Bathing/ Shower/ Hygiene: May shower with  protection but do not get wound dressing(s) wet. - Cover with cast protector (can purchase cast protector at [facility] or [facility] ) Edema Control - Lymphedema / SCD / Other: Elevate legs to the level of the heart or above for 30 minutes daily and/or when sitting, a frequency of: - throughout the day Avoid standing for long periods of time. Moisturize legs daily. - right leg and foot every night. Off-Loading: T Contact Cast to Left Lower Extremity otal Wedge shoe to: - Globoped Shoe to right foot Other: - keep pressure off of the bottom of your feet Additional Orders / Instructions: Follow Nutritious Diet WOUND #1: - Calcaneus Wound Laterality: Right Cleanser: Normal Saline (Generic) 1 x Per Week/30 Days Discharge Instructions: Cleanse the wound with Normal Saline prior to applying a clean dressing using gauze sponges, not tissue or cotton balls. Cleanser: Wound Cleanser (Generic) 1 x Per Week/30 Days Discharge Instructions: Cleanse the wound with wound cleanser prior to applying a clean dressing using gauze sponges, not tissue or cotton balls. Prim Dressing: VENDAJE 1 x Per Week/30 Days ary Secondary Dressing: Optifoam Non-Adhesive Dressing, 4x4 in 1 x Per Week/30 Days Discharge Instructions: Apply over primary dressing cut to make foam border Secondary Dressing: Woven Gauze Sponge, Non-Sterile 4x4 in 1 x Per Week/30 Days Discharge Instructions: Apply over primary dressing as directed. Secured With: Elastic Bandage 4 inch (ACE bandage) (Generic) 1 x Per Week/30  Days Discharge Instructions: Secure with ACE bandage as directed. Secured With: [employer], 4.5x3.1 (in/yd) (Generic) 1 x Per Week/30 Days Discharge Instructions: Secure with Kerlix as directed. WOUND #2: - Calcaneus Wound Laterality: Left Cleanser: Normal Saline (Generic) 1 x Per Week/30 Days Discharge Instructions: Cleanse the wound with Normal Saline prior to applying a clean dressing using gauze sponges, not tissue or  cotton balls. Cleanser: Wound Cleanser 1 x Per Week/30 Days Discharge Instructions: Cleanse the wound with wound cleanser prior to applying a clean dressing using gauze sponges, not tissue or cotton balls. Peri-Wound Care: Zinc Oxide Ointment 30g tube 1 x Per Week/30 Days Discharge Instructions: Apply Zinc Oxide to periwound with each dressing change Prim Dressing: VENDAJE 1 x Per Week/30 Days ary Secondary Dressing: Optifoam Non-Adhesive Dressing, 4x4 in (Generic) 1 x Per Week/30 Days Discharge Instructions: Apply over primary dressing as directed. Secondary Dressing: Woven Gauze Sponge, Non-Sterile 4x4 in 1 x Per Week/30 Days Discharge Instructions: Apply over primary dressing as directed. Secured With: 97M Medipore H Soft Cloth Surgical T ape, 4 x 10 (in/yd) (Generic) 1 x Per Week/30 Days Discharge Instructions: Secure with tape as directed. 03/01/2022: The cast got wet, but there is no significant maceration to his foot. The callus softened and it looks like the wound on his left calcaneus may be a little bit wider. The wound on his right calcaneus is just a narrow slit. Continued accumulation of periwound callus bilaterally. I debrided callus and nonviable subcutaneous tissue from the right. I then prepared both sites for application of Vendaje (trial skin substitute product, no charge). The product was applied to both calcaneal wounds and secured in place with Adaptic and Steri-Strips. Gauze sponges were used to create a bolster to hold the product against the wound surface. The right foot was wrapped in Kerlix and Ace bandage and a total contact cast was applied to the left. Follow-up in 1 week. Electronic Signature(s) Signed: 03/01/2022 11:29:45 AM By: Fredirick Maudlin MD FACS Entered By: Fredirick Maudlin on 03/01/2022 11:29:45 -------------------------------------------------------------------------------- HxROS Details Patient Name: Date of Service: [name], [place] 03/01/2022 10:00  A M Medical Record Number: [id number] Patient Account Number: [id number] Date of Birth/Sex: Treating RN: [id number] (46 y.o. [name] Primary Care Provider: [name] Other Clinician: Referring Provider: Treating Provider/Extender: [name] in Treatment: 50 Information Obtained From Patient Constitutional Symptoms (General Health) Medical History: Past Medical History Notes: COVID PNA 07/22/2019-11/14/2019 [facility] ECMO, foot drop left foot , Eyes Medical History: Negative for: Cataracts; Glaucoma; Optic Neuritis Ear/Nose/Mouth/Throat Medical History: Negative for: Chronic sinus problems/congestion; Middle ear problems Hematologic/Lymphatic Medical History: Negative for: Anemia; Hemophilia; Human Immunodeficiency Virus; Lymphedema; Sickle Cell Disease Respiratory Medical History: Positive for: Asthma Negative for: Aspiration; Chronic Obstructive Pulmonary Disease (COPD); Pneumothorax; Sleep Apnea; Tuberculosis Cardiovascular Medical History: Positive for: Angina - with COVID; Hypertension Negative for: Arrhythmia; Congestive Heart Failure; Coronary Artery Disease; Deep Vein Thrombosis; Hypotension; Myocardial Infarction; Peripheral Arterial Disease; Peripheral Venous Disease; Phlebitis; Vasculitis Gastrointestinal Medical History: Negative for: Cirrhosis ; Colitis; Crohns; Hepatitis A; Hepatitis B; Hepatitis C Endocrine Medical History: Negative for: Type I Diabetes; Type II Diabetes Genitourinary Medical History: Negative for: End Stage Renal Disease Past Medical History Notes: kidney stone Immunological Medical History: Negative for: Lupus Erythematosus; Raynauds; Scleroderma Integumentary (Skin) Medical History: Negative for: History of Burn Musculoskeletal Medical History: Negative for: Gout; Rheumatoid Arthritis; Osteoarthritis; Osteomyelitis Neurologic Medical History: Negative for: Dementia; Neuropathy; Quadriplegia;  Paraplegia; Seizure Disorder Oncologic Medical History: Negative for: Received  Chemotherapy; Received Radiation Psychiatric Medical History: Negative for: Anorexia/bulimia; Confinement Anxiety Past Medical History Notes: anxiety Immunizations Pneumococcal Vaccine: Received Pneumococcal Vaccination: No Implantable Devices None Hospitalization / Surgery History Type of Hospitalization/Surgery COVID PNA 07/22/2019- 11/14/2019 03/27/2020 wound debridement/ skin graft Family and Social History Cancer: Yes - Maternal Grandparents; Diabetes: Yes - Father,Paternal Grandparents; Heart Disease: Yes - Maternal Grandparents; Hereditary Spherocytosis: No; Hypertension: Yes - Father,Paternal Grandparents; Kidney Disease: No; Lung Disease: Yes - Siblings; Seizures: No; Stroke: No; Thyroid Problems: No; Tuberculosis: No; Never smoker; Marital Status - Married; Alcohol Use: Never; Drug Use: No History; Caffeine Use: Daily - tea, soda; Financial Concerns: No; Food, Clothing or Shelter Needs: No; Support System Lacking: No; Transportation Concerns: No Engineer, maintenance) Signed: 03/01/2022 12:52:06 PM By: Fredirick Maudlin MD FACS Signed: 03/01/2022 4:42:49 PM By: Baruch Gouty RN, BSN Entered By: Fredirick Maudlin on 03/01/2022 11:16:21 -------------------------------------------------------------------------------- Total Contact Cast Details Patient Name: Date of Service: [name], [place] 03/01/2022 10:00 A M Medical Record Number: [id number] Patient Account Number: [id number] Date of Birth/Sex: Treating RN: 08/05/1976 (46 y.o. [name] Primary Care Provider: [name] Other Clinician: Referring Provider: Treating Provider/Extender: [name] in Treatment: 42 T Contact Cast Applied for Wound Assessment: otal Wound #2 Left Calcaneus Performed By: Physician Fredirick Maudlin, MD Post Procedure Diagnosis Same as Pre-procedure Electronic  Signature(s) Signed: 03/01/2022 12:52:06 PM By: Fredirick Maudlin MD FACS Signed: 03/01/2022 4:42:49 PM By: Baruch Gouty RN, BSN Entered By: Baruch Gouty on 03/01/2022 11:08:40 -------------------------------------------------------------------------------- [place] Details Patient Name: Date of Service: [name], [place]. 03/01/2022 Medical Record Number: [id number] Patient Account Number: [id number] Date of Birth/Sex: Treating RN: 06-09-1976 (46 y.o. [name] Primary Care Provider: [name] Other Clinician: Referring Provider: Treating Provider/Extender: [name] in Treatment: 57 Diagnosis Coding ICD-10 Codes Code Description [id number] Non-pressure chronic ulcer of other part of right foot with fat layer exposed L97.522 Non-pressure chronic ulcer of other part of left foot with fat layer exposed Facility Procedures CPT4 Code: [id number] Description: [id number] - APPLY TOTAL CONTACT LEG CAST ICD-10 Diagnosis Description L97.522 Non-pressure chronic ulcer of other part of left foot with fat layer exposed Modifier: Quantity: 1 CPT4 Code: [id number] Description: [id number] - DEBRIDE WOUND 1ST 20 SQ CM OR < ICD-10 Diagnosis Description L97.512 Non-pressure chronic ulcer of other part of right foot with fat layer exposed Modifier: 59 Quantity: 1 Physician Procedures : CPT4 Code Description Modifier [id number] - WC PHYS LEVEL 3 - EST PT 25 ICD-10 Diagnosis Description L97.512 Non-pressure chronic ulcer of other part of right foot with fat layer exposed L97.522 Non-pressure chronic ulcer of other part of left foot  with fat layer exposed Quantity: 1 : [id number] - WC PHYS APPLY TOTAL CONTACT CAST ICD-10 Diagnosis Description L97.522 Non-pressure chronic ulcer of other part of left foot with fat layer exposed Quantity: 1 : [id number] - WC PHYS DEBR WO ANESTH 20 SQ CM 59 ICD-10 Diagnosis Description L97.512 Non-pressure chronic ulcer of other part of  right foot with fat layer exposed Quantity: 1 Notes Vendaje skin sub donated trial product Electronic Signature(s) Signed: 03/01/2022 12:52:06 PM By: Fredirick Maudlin MD FACS Signed: 03/01/2022 4:42:49 PM By: Baruch Gouty RN, BSN Previous Signature: 03/01/2022 11:31:03 AM Version By: Fredirick Maudlin MD FACS Entered By: Baruch Gouty on 03/01/2022 12:10:56

## 2022-03-03 ENCOUNTER — Ambulatory Visit (HOSPITAL_BASED_OUTPATIENT_CLINIC_OR_DEPARTMENT_OTHER): Payer: PPO | Admitting: General Surgery

## 2022-03-08 ENCOUNTER — Ambulatory Visit (INDEPENDENT_AMBULATORY_CARE_PROVIDER_SITE_OTHER): Payer: PPO | Admitting: Behavioral Health

## 2022-03-08 ENCOUNTER — Encounter (HOSPITAL_BASED_OUTPATIENT_CLINIC_OR_DEPARTMENT_OTHER): Payer: PPO | Attending: General Surgery | Admitting: General Surgery

## 2022-03-08 ENCOUNTER — Encounter: Payer: Self-pay | Admitting: Behavioral Health

## 2022-03-08 DIAGNOSIS — F331 Major depressive disorder, recurrent, moderate: Secondary | ICD-10-CM

## 2022-03-08 DIAGNOSIS — L97522 Non-pressure chronic ulcer of other part of left foot with fat layer exposed: Secondary | ICD-10-CM | POA: Diagnosis not present

## 2022-03-08 DIAGNOSIS — F4321 Adjustment disorder with depressed mood: Secondary | ICD-10-CM | POA: Diagnosis not present

## 2022-03-08 DIAGNOSIS — L89613 Pressure ulcer of right heel, stage 3: Secondary | ICD-10-CM | POA: Diagnosis not present

## 2022-03-08 DIAGNOSIS — F39 Unspecified mood [affective] disorder: Secondary | ICD-10-CM | POA: Diagnosis not present

## 2022-03-08 DIAGNOSIS — F411 Generalized anxiety disorder: Secondary | ICD-10-CM

## 2022-03-08 DIAGNOSIS — L89620 Pressure ulcer of left heel, unstageable: Secondary | ICD-10-CM | POA: Diagnosis not present

## 2022-03-08 MED ORDER — DULOXETINE HCL 40 MG PO CPEP: ORAL_CAPSULE | ORAL | 1 refills | Status: DC

## 2022-03-08 MED ORDER — AUVELITY 45-105 MG PO TBCR: EXTENDED_RELEASE_TABLET | ORAL | 3 refills | Status: DC

## 2022-03-08 MED ORDER — LAMOTRIGINE 100 MG PO TABS: 150.0000 mg | ORAL_TABLET | Freq: Every day | ORAL | 3 refills | Status: DC

## 2022-03-08 NOTE — Progress Notes (Signed)
[facility]  Patient ID: [name],  MRN: [id number]  PCP: Melida Quitter, MD  Date of Evaluation: 03/08/2022 Time spent:30 minutes  Chief Complaint:  Chief Complaint   Anxiety; Depression; Family Problem; Follow-up; Medication Refill; Medication Problem; Patient Education     HISTORY/CURRENT STATUS: HPI  46 year old male presents to this office for follow up and medicaiton management. No changes since last visit. Says that he was getting better but continues to slip deeper into severe depression recently.  Says that he and his wife are having problems and he has been talking to other women online. His wife found out about his conversations. She is trying to work with him but he must start therapy. He is requesting medication adjustment this visit.  He says his anxiety level today is 4/10 and depression is 8/10. He is sleeping 7-8 hours per night. He would like to adjust or try another medication to help.  Denies mania, no psychosis, no current SI. No HI.    No prior psychiatric medication trials   Individual Medical History/ Review of Systems: Changes? :No   Allergies: Patient has no known allergies.  Current Medications:  Current Outpatient Medications:    DULoxetine HCl 40 MG CPEP, Take two capsules at once daily in the morning after breakfast., Disp: 60 capsule, Rfl: 1   albuterol (VENTOLIN HFA) 108 (90 Base) MCG/ACT inhaler, INHALE 2 PUFFS INTO THE LUNGS EVERY 4 HOURS AS NEEDED FOR WHEEZING OR SHORTNESS OF BREATH., Disp: 18 g, Rfl: 1   amLODipine (NORVASC) 2.5 MG tablet, Take 1 tablet (2.5 mg total) by mouth daily., Disp: 30 tablet, Rfl: 5   budesonide-formoterol (SYMBICORT) 160-4.5 MCG/ACT inhaler, Inhale 2 puffs into the lungs 2 (two) times daily., Disp: 10.2 g, Rfl: 5   Dextromethorphan-buPROPion ER (AUVELITY) 45-105 MG TBCR, Take one tablet for 3 days, then take two tablets (90-210 mg) total daily in the am., Disp: 60 tablet, Rfl: 3   lamoTRIgine (LAMICTAL)  100 MG tablet, Take 1.5 tablets (150 mg total) by mouth daily., Disp: 30 tablet, Rfl: 3   metoprolol tartrate (LOPRESSOR) 25 MG tablet, TAKE 1 TABLET BY MOUTH TWICE A DAY, Disp: 180 tablet, Rfl: 3   montelukast (SINGULAIR) 10 MG tablet, Take 1 tablet (10 mg total) by mouth daily., Disp: 30 tablet, Rfl: 5   pantoprazole (PROTONIX) 40 MG tablet, Take 1 tablet by mouth daily., Disp: , Rfl:    traZODone (DESYREL) 100 MG tablet, Take 2 tablets (200 mg total) by mouth at bedtime., Disp: 180 tablet, Rfl: 3   triamcinolone (NASACORT) 55 MCG/ACT AERO nasal inhaler, Place 2 sprays into the nose daily., Disp: , Rfl:  Medication Side Effects: none  Family Medical/ Social History: Changes? No  MENTAL HEALTH EXAM:  There were no vitals taken for this visit.There is no height or weight on file to calculate BMI.  General Appearance: Casual, Neat, and Well Groomed  Eye Contact:  Good  Speech:  Clear and Coherent  Volume:  Normal  Mood:  Depressed  Affect:  Appropriate, Depressed, and Flat  Thought Process:  Coherent  Orientation:  Full (Time, Place, and Person)  Thought Content: Logical   Suicidal Thoughts:  No  Homicidal Thoughts:  No  Memory:  WNL  Judgement:  Good  Insight:  Good  Psychomotor Activity:  Normal  Concentration:  Concentration: Good  Recall:  Good  Fund of Knowledge: Good  Language: Good  Assets:  Desire for Improvement  ADL's:  Intact  Cognition:  WNL  Prognosis:  Good    DIAGNOSES:    ICD-10-CM   1. Generalized anxiety disorder  F41.1 DULoxetine HCl 40 MG CPEP    lamoTRIgine (LAMICTAL) 100 MG tablet    2. Major depressive disorder, recurrent episode, moderate (HCC)  F33.1 DULoxetine HCl 40 MG CPEP    lamoTRIgine (LAMICTAL) 100 MG tablet    Dextromethorphan-buPROPion ER (AUVELITY) 45-105 MG TBCR    3. Situational depression  F43.21 DULoxetine HCl 40 MG CPEP    4. Unspecified mood (affective) disorder (HCC)  F39 DULoxetine HCl 40 MG CPEP    lamoTRIgine (LAMICTAL) 100  MG tablet      Receiving Psychotherapy: No    RECOMMENDATIONS:   Greater than 50% of 30 min face to face time with patient was spent on counseling and coordination of care.  We discussed patients chronic health concerns and his continue wound care for feet. He is feeling very depressed again. He is having some family problems due to talking to other women online. These problems have continued to get worse. We discussed adjusting his medications this visit.  Starting psychotherapy tomorrow.   Discussed medication options, side effects and his goals for tx. Recommended Psychotherapy to Pt. We agreed to: To continue Auvelity 90-210 daily. To increase Cymbalta to 80 mg daily. Will take two 40 mg capsule at once.  Continue Trazodone 200 mg at bedtime Increase Lamictal to 150 mg daily, patients request.  Will report any side effects or worsening symptoms To follow up in 6 weeks to reassess Provided emergency contact information Reviewed PDMP    Joan Flores, NP

## 2022-03-09 ENCOUNTER — Ambulatory Visit (INDEPENDENT_AMBULATORY_CARE_PROVIDER_SITE_OTHER): Payer: PPO | Admitting: Psychologist

## 2022-03-09 DIAGNOSIS — F331 Major depressive disorder, recurrent, moderate: Secondary | ICD-10-CM

## 2022-03-09 NOTE — Progress Notes (Signed)
[facility] Behavioral Health Counselor Initial Adult Exam  Name: [name] Date: 03/09/2022 MRN: [id number] DOB: 07-28-1976 PCP: Melida Quitter, MD  Time spent: 2:07 pm to 2:37 pm; total time: 30 minutes  This session was held via in person. The patient consented to in-person therapy and was in the clinician's office. Limits of confidentiality were discussed with the patient.   Guardian/Payee:  NA    Paperwork requested: No   Reason for Visit /Presenting Problem: Patient was referred due to experiencing depression. Continuing to talk, he also voiced that he experiences challenges with pornography. He also indicated having relationship challenges with his wife.   Mental Status Exam: Appearance:   Well Groomed     Behavior:  Appropriate  Motor:  Normal  Speech/Language:   Clear and Coherent  Affect:  Appropriate  Mood:  normal  Thought process:  normal  Thought content:    WNL  Sensory/Perceptual disturbances:    WNL  Orientation:  oriented to person, place, and time/date  Attention:  Good  Concentration:  Good  Memory:  WNL  Fund of knowledge:   Good  Insight:    Good  Judgment:   Fair  Impulse Control:  Fair     Reported Symptoms:  The patient endorsed experiencing the following: feeling down, sad, tearful, social isolation, rumination of negative thoughts, low self-esteem, and thoughts of hopelessness. He denied suicidal and homicidal ideation.   Risk Assessment: Danger to Self:  No Self-injurious Behavior: No Danger to Others: No Duty to Warn:no Physical Aggression / Violence:No  Access to Firearms a concern: No  Gang Involvement:No  Patient / guardian was educated about steps to take if suicide or homicide risk level increases between visits: n/a While future psychiatric events cannot be accurately predicted, the patient does not currently require acute inpatient psychiatric care and does not currently meet [facility] involuntary commitment  criteria.  Substance Abuse History: Current substance abuse: No     Past Psychiatric History:   No previous psychological problems have been observed Outpatient Providers:NA History of Psych Hospitalization: No  Psychological Testing:  NA    Abuse History:  Victim of: No.,  NA    Report needed: No. Victim of Neglect:No. Perpetrator of  NA   Witness / Exposure to Domestic Violence: No   Protective Services Involvement: No  Witness to [employer] Violence:  No   Family History:  Family History  Problem Relation Age of Onset   Asthma Brother    Diabetes Father    Hypertension Father    Diabetes Paternal Grandmother    Hypertension Paternal Grandmother    Migraines Mother    GER disease Mother    Pancreatic cancer Maternal Grandmother    Heart disease Maternal Grandfather     Living situation: the patient lives with their family  Sexual Orientation: Straight  Relationship Status: married  Name of spouse / other:[name] If a parent, number of children / ages:Patient has a 68 year old son named [name]  Support Systems: friends  Surveyor, quantity Stress:  No   Income/Employment/Disability: Doctor, general practice: No   Educational History: Education: high school diploma/GED  Religion/Sprituality/World View: Christian/baptist  Any cultural differences that may affect / interfere with treatment:  not applicable   Recreation/Hobbies: Watching television and playing video games.   Stressors: Other: Relationship challenges with wife, pornography     Strengths: Self Advocate  Barriers:  NA   Legal History: Pending legal issue / charges: The patient has no  significant history of legal issues. History of legal issue / charges:  NA  Medical History/Surgical History: reviewed Past Medical History:  Diagnosis Date   Anginal pain (HCC)    with covid   Anxiety    Asthma    Dyspnea    GERD (gastroesophageal reflux disease)    Headache    History of  kidney stones    LEFT URETERAL STONE   HTN (hypertension)    Pancreatitis 2018   GALLBALDDER SLUDGE CAUSED ISSUED RESOLVED   Pneumonia 07/2019   covid    Past Surgical History:  Procedure Laterality Date   CANNULATION FOR ECMO (EXTRACORPOREAL MEMBRANE OXYGENATION) N/A 08/28/2019   Procedure: CANNULATION FOR VV ECMO (EXTRACORPOREAL MEMBRANE OXYGENATION);  Surgeon: Donata Clay, Theron Arista, MD;  Location: [facility] OR;  Service: Open Heart Surgery;  Laterality: N/A;  CRESCENT CANNULA   CANNULATION FOR ECMO (EXTRACORPOREAL MEMBRANE OXYGENATION) N/A 09/10/2019   Procedure: CANNULATION FOR ECMO (EXTRACORPOREAL MEMBRANE OXYGENATION) PUTTING IN CRESCENT 32FR CANNULA  AND REMOVING GROING CANNULATION;  Surgeon: Linden Dolin, MD;  Location: [facility] OR;  Service: Open Heart Surgery;  Laterality: N/A;  PUTTING IN CRESCENT/REMOVING GROIN CANNULATION   CYSTOSCOPY/URETEROSCOPY/HOLMIUM LASER/STENT PLACEMENT Left 04/18/2019   Procedure: LEFT URETEROSCOPY/HOLMIUM LASER/STENT PLACEMENT;  Surgeon: Crist Fat, MD;  Location[facility];  Service: Urology;  Laterality: Left;   CYSTOSCOPY/URETEROSCOPY/HOLMIUM LASER/STENT PLACEMENT Left 05/02/2019   Procedure: CYSTOSCOPY/URETEROSCOPY/HOLMIUM LASER/STENT EXCHANGE;  Surgeon: Crist Fat, MD;  Location: [facility] ORS;  Service: Urology;  Laterality: Left;   ECMO CANNULATION N/A 08/03/2019   Procedure: ECMO CANNULATION;  Surgeon: Linden Dolin, MD;  Location: [facility] IN[facility]AS[facility];  Service: Cardiovascular;  Laterality: N/A;   ESOPHAGOGASTRODUODENOSCOPY N/A 09/11/2019   Procedure: ESOPHAGOGASTRODUODENOSCOPY (EGD);  Surgeon: Linden Dolin, MD;  Location: [facility] OR;  Service: Thoracic;  Laterality: N/A;   GRAFT APPLICATION Bilateral 03/27/2020   Procedure: APPLICATION OF SKIN GRAFT BILATERAL FEET;  Surgeon: Felecia Shelling, DPM;  Location: [facility] ORS;  Service: Podiatry;  Laterality: Bilateral;   IR REPLC GASTRO/COLONIC TUBE PERCUT W/FLUORO  10/14/2019   IRRIGATION AND  DEBRIDEMENT SHOULDER Right 09/29/2017   Procedure: IRRIGATION AND DEBRIDEMENT SHOULDER;  Surgeon: Tarry Kos, MD;  Location: [facility] OR;  Service: Orthopedics;  Laterality: Right;   LUMBAR DISC SURGERY  2002   NASAL ENDOSCOPY WITH EPISTAXIS CONTROL N/A 08/31/2019   Procedure: NASAL ENDOSCOPY WITH EPISTAXIS CONTROL WITH CAUTERIZATION;  Surgeon: Christia Reading, MD;  Location[facility] OR;  Service: ENT;  Laterality: N/A;   PORTACATH PLACEMENT N/A 09/11/2019   Procedure: PEG TUBE INSERTION - BEDSIDE;  Surgeon: Linden Dolin, MD;  Location: [facility] OR;  Service: Thoracic;  Laterality: N/A;   TEE WITHOUT CARDIOVERSION N/A 08/28/2019   Procedure: TRANSESOPHAGEAL ECHOCARDIOGRAM (TEE);  Surgeon: Donata Clay, Theron Arista, MD;  Location: [facility] OR;  Service: Open Heart Surgery;  Laterality: N/A;   TEE WITHOUT CARDIOVERSION N/A 09/10/2019   Procedure: TRANSESOPHAGEAL ECHOCARDIOGRAM (TEE);  Surgeon: Linden Dolin, MD;  Location: [facility] OR;  Service: Open Heart Surgery;  Laterality: N/A;   WOUND DEBRIDEMENT Bilateral 03/27/2020   Procedure: EXCISIONAL DEBRIDEMENT OF ULCERS BILATERAL FEET;  Surgeon: Felecia Shelling, DPM;  Location: [facility];  Service: Podiatry;  Laterality: Bilateral;    Medications: Current Outpatient Medications  Medication Sig Dispense Refill   albuterol (VENTOLIN HFA) 108 (90 Base) MCG/ACT inhaler INHALE 2 PUFFS INTO THE LUNGS EVERY 4 HOURS AS NEEDED FOR WHEEZING OR SHORTNESS OF BREATH. 18 g 1   amLODipine (NORVASC) 2.5 MG tablet Take  1 tablet (2.5 mg total) by mouth daily. 30 tablet 5   budesonide-formoterol (SYMBICORT) 160-4.5 MCG/ACT inhaler Inhale 2 puffs into the lungs 2 (two) times daily. 10.2 g 5   Dextromethorphan-buPROPion ER (AUVELITY) 45-105 MG TBCR Take one tablet for 3 days, then take two tablets (90-210 mg) total daily in the am. 60 tablet 3   DULoxetine HCl 40 MG CPEP Take two capsules at once daily in the morning after breakfast. 60 capsule 1   lamoTRIgine (LAMICTAL) 100 MG tablet Take 1.5 tablets (150  mg total) by mouth daily. 30 tablet 3   metoprolol tartrate (LOPRESSOR) 25 MG tablet TAKE 1 TABLET BY MOUTH TWICE A DAY 180 tablet 3   montelukast (SINGULAIR) 10 MG tablet Take 1 tablet (10 mg total) by mouth daily. 30 tablet 5   pantoprazole (PROTONIX) 40 MG tablet Take 1 tablet by mouth daily.     traZODone (DESYREL) 100 MG tablet Take 2 tablets (200 mg total) by mouth at bedtime. 180 tablet 3   triamcinolone (NASACORT) 55 MCG/ACT AERO nasal inhaler Place 2 sprays into the nose daily.     No current facility-administered medications for this visit.    No Known Allergies  Diagnoses:  F33.1 major depressive affective disorder, recurrent, moderate  Plan of Care: The patient is a 46 year old Caucasian male who was referred due to experiencing depression. The patient lives at home with his wife, son, and two dogs. The patient meets criteria for a diagnosis of F33.1 major depressive affective disorder, recurrent, moderate based off of the following:  feeling down, sad, tearful, social isolation, rumination of negative thoughts, low self-esteem, and thoughts of hopelessness. He denied suicidal and homicidal ideation. During the conversation the patient also disclosed that he has experienced marital affairs with other women. He also described himself as experiencing challenges with pornography. The patient indicated that he primarily wanted to better understand and address issues surrounding the marital affairs and pornography that he experiences.   Based off of the patient's main concern, it is recommended that the patient be referred out to see a clinician who has more experience in couples counseling and working with individuals who experience challenges with pornography as this clinician is not competent in that field. The patient stated that he understood and that he was open to a referral for someone who has more experience. Patient was agreeable to the plan discussed.    Hilbert Corrigan, PsyD

## 2022-03-09 NOTE — Progress Notes (Signed)
                Leilanee Righetti, PsyD 

## 2022-03-14 NOTE — Progress Notes (Signed)
DANELL, SISK ([id number]) Visit Report for 03/08/2022 Chief Complaint Document Details Patient Name: Date of Service: [name] [place] 03/08/2022 8:45 A M Medical Record Number: [id number] Patient Account Number: [id number] Date of Birth/Sex: Treating RN: 04-16-1976 (46 y.o. [name] Primary Care Provider: Cristie [name] Other Clinician: Referring Provider: Treating Provider/Extender: [name] in Treatment: 11 Information Obtained from: Patient Chief Complaint Bilateral Plantar Foot Ulcers Electronic Signature(s) Signed: 03/08/2022 9:59:49 AM By: [name] Maudlin MD FACS Entered By: [name] on 03/08/2022 09:59:48 -------------------------------------------------------------------------------- Cellular or Tissue Based Product Details Patient Name: Date of Service: [name], [place] 03/08/2022 8:45 A M Medical Record Number: [id number] Patient Account Number: [id number] Date of Birth/Sex: Treating RN: Apr 19, 1976 (46 y.o. [name] Primary Care Provider: Cristie [name] Other Clinician: Referring Provider: Treating Provider/Extender: [name] in Treatment: 66 Cellular or Tissue Based Product Type Wound #2 Left Calcaneus Applied to: Performed By: Physician [name] Maudlin, MD Cellular or Tissue Based Product Type: Other Level of Consciousness (Pre-procedure): Awake and Alert Pre-procedure Verification/Time Out Yes - 09:35 Taken: Location: genitalia / hands / feet / multiple digits Wound Size (sq cm): 0.12 Product Size (sq cm): 4 Waste Size (sq cm): 0 Waste Reason: Not needed because wound has decreased in size Amount of Product Applied (sq cm): 4 Instrument Used: Forceps, Scissors Lot #: [id number] Order #: 12 Expiration Date: 10/12/2024 Fenestrated: No Reconstituted: No Secured: Yes Secured With: Steri-Strips Dressing Applied: Yes Primary Dressing: Adaptic;gauze Procedural Pain: 0 Post  Procedural Pain: 0 Response to Treatment: Procedure was tolerated well Level of Consciousness (Post- Awake and Alert procedure): Post Procedure Diagnosis Same as Pre-procedure Notes **** Vendaje******Trial Electronic Signature(s) Signed: 03/08/2022 5:54:25 PM By: [name] Catholic RN Signed: 03/14/2022 7:49:21 AM By: [name] Maudlin MD FACS Entered By: [name] on 03/08/2022 17:49:19 -------------------------------------------------------------------------------- Cellular or Tissue Based Product Details Patient Name: Date of Service: [name], [place] 03/08/2022 8:45 A M Medical Record Number: [id number] Patient Account Number: [id number] Date of Birth/Sex: Treating RN: 1976/05/14 (46 y.o. [name] Primary Care Provider: Cristie [name] Other Clinician: Referring Provider: Treating Provider/Extender: [name] in Treatment: 69 Cellular or Tissue Based Product Type Wound #1 Right Calcaneus Applied to: Performed By: Physician [name] Maudlin, MD Cellular or Tissue Based Product Type: Other Level of Consciousness (Pre-procedure): Awake and Alert Pre-procedure Verification/Time Out Yes - 09:35 Taken: Location: genitalia / hands / feet / multiple digits Wound Size (sq cm): 0.01 Product Size (sq cm): 4 Waste Size (sq cm): 0 Amount of Product Applied (sq cm): 4 Instrument Used: Forceps, Scissors Lot #: [id number] Order #: 12 Expiration Date: 10/12/2024 Fenestrated: No Reconstituted: No Secured: Yes Secured With: Steri-Strips Dressing Applied: Yes Primary Dressing: Adaptic;gauze Procedural Pain: 0 Post Procedural Pain: 0 Response to Treatment: Procedure was tolerated well Level of Consciousness (Post- Awake and Alert procedure): Post Procedure Diagnosis Same as Pre-procedure Notes Vendaje Electronic Signature(s) Signed: 03/14/2022 7:49:21 AM By: [name] Maudlin MD FACS Signed: 03/14/2022 5:14:43 PM By: [name] Catholic RN Previous  Signature: 03/08/2022 5:54:25 PM Version By: [name] Catholic RN Entered By: [name] on 03/10/2022 12:03:59 -------------------------------------------------------------------------------- Debridement Details Patient Name: Date of Service: [name], [employer] 03/08/2022 8:45 A M Medical Record Number: [id number] Patient Account Number: [id number] Date of Birth/Sex: Treating RN: 10/28/1975 (46 y.o. [name] Primary Care Provider: Cristie [name] Other Clinician: Referring Provider: Treating Provider/Extender: [name] in [id number] Debridement Performed for Assessment:  Wound #1 Right Calcaneus Performed By: Physician [name] Maudlin, MD Debridement Type: Debridement Level of Consciousness (Pre-procedure): Awake and Alert Pre-procedure Verification/Time Out Yes - 09:35 Taken: Start Time: 09:35 Pain Control: Lidocaine 5% topical ointment T Area Debrided (L x W): otal 0.1 (cm) x 0.1 (cm) = 0.01 (cm) Tissue and other material debrided: Non-Viable, Eschar, Slough, Slough Level: Non-Viable Tissue Debridement Description: Selective/Open Wound Instrument: Curette Bleeding: Minimum Hemostasis Achieved: Pressure End Time: 09:36 Procedural Pain: 0 Post Procedural Pain: 0 Response to Treatment: Procedure was tolerated well Level of Consciousness (Post- Awake and Alert procedure): Post Debridement Measurements of Total Wound Length: (cm) 0.1 Stage: Category/Stage III Width: (cm) 0.1 Depth: (cm) 0.1 Volume: (cm) 0.001 Character of Wound/Ulcer Post Debridement: Improved Post Procedure Diagnosis Same as Pre-procedure Electronic Signature(s) Signed: 03/08/2022 10:56:38 AM By: [name] Maudlin MD FACS Signed: 03/08/2022 5:54:25 PM By: [name] Catholic RN Entered By: [name] on 03/08/2022 09:39:25 -------------------------------------------------------------------------------- HPI Details Patient Name: Date of Service: [name], [place].  03/08/2022 8:45 A M Medical Record Number: [id number] Patient Account Number: [id number] Date of Birth/Sex: Treating RN: [birth year] (46 y.o. [name] Primary Care Provider: Cristie [name] Other Clinician: Referring Provider: Treating Provider/Extender: [name] in Treatment: 84 History of Present Illness HPI Description: Wounds are12/03/2020 upon evaluation today patient presents for initial inspection here in our clinic concerning issues he has been having with the bottoms of his feet bilaterally. He states these actually occurred as wounds when he was hospitalized for 5 months secondary to Covid. He was apparently with tilting bed where he was in an upright position quite frequently and apparently this occurred in some way shape or form during that time. Fortunately there is no sign of active infection at this time. No fevers, chills, nausea, vomiting, or diarrhea. With that being said he still has substantial wounds on the plantar aspects of his feet Theragen require quite a bit of work to get these to heal. He has been using Santyl currently though that is been problematic both in receiving the medication as well as actually paid for it as it is become quite expensive. Prior to the experience with Covid the patient really did not have any major medical problems other than hypertension he does have some mild generalized weakness following the Covid experience. 07/22/2020 on evaluation today patient appears to be doing okay in regard to his foot ulcers I feel like the wound beds are showing signs of better improvement that I do believe the Iodoflex is helping in this regard. With that being said he does have a lot of drainage currently and this is somewhat blue/green in nature which is consistent with Pseudomonas. I do think a culture today would be appropriate for [place] to evaluate and see if that is indeed the case I would likely start him on antibiotic orally as well  he is not allergic to Cipro knows of no issues he has had in the past 12/21; patient was admitted to the clinic earlier this month with bilateral presumed pressure ulcers on the bottom of his feet apparently related to excessive pressure from a tilt table arrangement in the intensive care unit. Patient relates this to being on ECMO but I am not really sure that is exactly related to that. I must say I have never seen anything like this. He has fairly extensive full-thickness wounds extending from his heel towards his midfoot mostly centered laterally. There is already been some healing distally. He does not appear  to have an arterial issue. He has been using gentamicin to the wound surfaces with Iodoflex to help with ongoing debridement 1/6; this is a patient with pressure ulcers on the bottom of his feet related to excessive pressure from a standing position in the intensive care unit. He is complaining of a lot of pain in the right heel. He is not a diabetic. 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 We have been using Iodoflex to help prepare the surfaces of both wounds for an advanced treatment product. He is nonambulatory spending most of his time in a wheelchair I have asked him not to propel the wheelchair with his heels 1/13; in general his wounds look better not much surface area change we have been using Iodoflex as of last week. I did an x-ray of the right heel as the patient was complaining of pain. I had some thoughts about a stress fracture perhaps Achilles tendon problems however what it showed was erosive changes along the inferior aspect of the calcaneus he now has a MRI booked for 1/20. 1/20; in general his wounds continue to be better. Some improvement in the large narrow areas proximally in his foot. He is still complaining of pain in the right heel and tenderness in certain areas of this wound. His MRI is tonight. I am not just looking for osteomyelitis that  was brought up on the x-ray I am wondering about stress fractures, tendon ruptures etc. He has no such findings on the left. Also noteworthy is that the patient had critical illness neuropathy and some of the discomfort may be actual improvement in nerve function I am just not sure. These wounds were initially in the setting of severe critical illness related to COVID-19. He was put in a standing position. He may have also been on pressors at the point contributing to tissue ischemia. By his description at some point these wounds were grossly necrotic extending proximally up into the Achilles part of his heel. I do not know that I have ever really seen pictures of them like this although they may exist in epic We have ordered Tri layer Oasis. I am trying to stimulate some granulation in these areas. This is of course assuming the MRI is negative for infection 1/27; since the patient was last here he saw Dr. Juleen China of infectious disease. He is planned for vancomycin and ceftriaxone. Prior operative culture grew MSSA. Also ordered baseline lab work. He also ordered arterial studies although the ABIs in our clinic were normal as well as his clinical exam these were normal I do not think he needs to see vascular surgery. His ABIs at the [facility] were 1.22 in the right triphasic waveforms with a normal TBI of 1.15 on the left ABI of 1.22 with triphasic waveforms and a normal TBI of 1.08. Finally he saw Dr. Amalia Hailey who will follow him in for 2 months. At this point I do not think he felt that he needed a procedure on the right calcaneal bone. Dr. Juleen China is elected for broad-spectrum antibiotic The patient is still having pain in the right heel. He walks with a walker 2/3; wounds are generally smaller. He is tolerating his IV antibiotics. I believe this is vancomycin and ceftriaxone. We are still waiting for Oasis burn in terms of his out-of-pocket max which he should be meeting soon given the IV antibiotics,  MRIs etc. I have asked him to check in on this. We are using silver collagen in  the meantime the wounds look better 2/10; tolerating IV vancomycin and Rocephin. We are waiting to apply for Oasis. Although I am not really sure where he is in his out-of-pocket max. 2/17 started the first application of Oasis trilayer. Still on antibiotics. The wounds have generally look better. The area on the left has a little more surface slough requiring debridement [id number]; second application of Oasis trilayer. The wound surface granulation is generally look better. The area on the left with undermining laterally I think is come in a bit. 10/08/2020 upon evaluation today patient is here today for [employer] application #3. Fortunately he seems to be doing extremely well with regard to this and we are seeing a lot of new epithelial growth which is great news. Fortunately there is no signs of active infection at this time. 10/16/2020 upon evaluation today patient appears to be doing well with regard to his foot ulcers. Do believe the Oasis has been of benefit for him. I do not see any signs of infection right now which is great news and I think that he has a lot of new epithelial growth which is great to see as well. The patient is very pleased to hear all of this. I do think we can proceed with the Oasis trilayer #4 today. 3/18; not as much improvement in these areas on his heels that I was hoping. I did reapply trilateral Oasis today the tissue looks healthier but not as much fill in as I was hoping. 3/25; better looking today I think this is come in a bit the tissue looks healthier. Triple layer Oasis reapplied #6 4/1; somewhat better looking definitely better looking surface not as much change in surface area as I was hoping. He may be spending more time [place] on days then he needs to although he does have heel offloading boots. Triple layer Oasis reapplied #7 4/7; unfortunately apparently [facility]  will not approve any further Oasis which is unfortunate since the patient did respond nicely both in terms of the condition of the wound bed as well as surface area. There is still some drainage coming from the wound but not a lot there does not appear to be any infection 4/15; we have been using Hydrofera Blue. He continues to have nice rims of epithelialization on the right greater than the left. The left the epithelialization is coming from the tip of his heel. There is moderate drainage. In this that concerns me about a total contact cast. There is no evidence of infection 4/29; patient has been using Hydrofera Blue with dressing changes. He has no complaints or issues today. 5/5; using Hydrofera Blue. I actually think that he looks marginally better than the last time I saw this 3 weeks ago. There are rims of epithelialization on the left thumb coming from the medial side on the right. Using Hydrofera Blue 5/12; using Hydrofera Blue. These continue to make improvements in surface area. His drainage was not listed as severe I therefore went ahead and put a cast on the left foot. Right foot we will continue to dress his previous 5/16; back for first total contact cast change. He did not tolerate this particularly well cast injury on the anterior tibia among other issues. Difficulty sleeping. I talked him about this in some detail and afterwards is elected to continue. I told him I would like to have a cast on for 3 weeks to see if this is going to help at all. I think  he agreed 5/19; I think the wound is better. There is no tunneling towards his midfoot. The undermining medially also looks better. He has a rim of new skin distally. I think we are making progress here. The area on the left also continues to look somewhat better to me using Hydrofera Blue. He has a list of complaints about the cast but none of them seem serious 5/26; patient presents for 1 week follow-up. He has been using a total  contact cast and tolerating this well. Hydrofera Blue is the main dressing used. He denies signs of infection. 6/2 Hydrofera Blue total contact cast on the left. These were large ulcers that formed in intensive care unit where the patient was recovering from [place]. May have had something to do with being ventilated in an upright positiono Pressors etc. We have been able to get the areas down considerably and a viable surface. There is some epithelialization in both sides. Note made of drainage 6/9; changed to [facility] last time because of drainage. He arrives with better looking surfaces and dimensions on the left than the right. Paradoxically the right actually probes more towards his midfoot the left is largely close down but both of these look improved. Using a total contact cast on the left 6/16; complex wounds on his bilateral plantar heels which were initially pressure injury from a stay in the ICU with COVID. We have been using silver alginate most recently. His dimensions of come in quite dramatically however not recently. We have been putting the left foot in a total contact cast 6/23; complex wounds on the bilateral plantar heels. I been putting the left in the cast paradoxically the area on the right is the one that is going towards closure at a faster rate. Quite a bit of drainage on the left. The patient went to see Dr. Amalia Hailey who said he was going to standby for skin grafts. I had actually considered sending him for skin grafts however he would be mandatorily off his feet for a period of weeks to months. I am thinking that the area on the right is going to close on its own the area on the left has been more stubborn even though we have him in a total contact cast 6/30; took him out of a total contact cast last week is the right heel seem to be making better progress than the left where I was placing the cast. We are using silver alginate. Both wounds are smaller right greater than  left 7/12; both wounds look as though they are making some progress. We are using silver alginate. Heel offloading boots 7/26; very gradual progress especially on the right. Using silver alginate. He is wearing heel offloading boots 8/18; he continues to close these wounds down very gradually. Using silver alginate. The problem polymen being definitive about this is areas of what appears to be callus around the margins. This is not a 100% of the area but certainly sizable especially on the right 9/1; bilateral plantar feet wounds secondary to prolonged pressure while being ventilated for COVID-19 in an upright position. Essentially pressure ulcers on the bottom of his feet. He is made substantial progress using silver alginate. 9/14; bilateral plantar feet wounds secondary to prolonged pressure. Making progress using silver alginate. 9/29 bilateral plantar feet wounds secondary to prolonged pressure. I changed him to Iodoflex last week. MolecuLight showing reddened blush fluorescence 10/11; patient presents for follow-up. He has no issues or complaints today. He denies signs of  infection. He continues to use Iodoflex and antibiotic ointment to the wound beds. 10/27; 2-week follow-up. No evidence of infection. He has callus and thick dry skin around the wound margins we have been using Iodoflex and Bactroban which was in response to a moderate left MolecuLight reddish blush fluorescence. 11/10; 2-week follow-up. Wound margins again have thick callus however the measurements of the actual wound sites are a lot smaller. Everything looks reasonably healthy here. We have been using Iodoflex He was approved for prime matrix but I have elected to delay this given the improvement in the surface area. Hopefully I will not regret that decision as were getting close to the end of the year in terms of insurance payment 12/8; 2-week follow-up. Wounds are generally smaller in size. These were initially  substantial wounds extending into the forefoot all the way into the heel on the bilateral plantar feet. They are now both located on the plantar heel distal aspect both of these have a lot of callus around the wounds I used a #5 curette to remove this on the right and the left also some subcutaneous debris to try and get the wound edges were using Iodoflex. He has heel offloading shoe 12/22; 2-week follow-up. Not really much improvement. He has thick callus around the outer edges of both wounds. I remove this there is some nonviable subcutaneous tissue as well. We have been using Iodoflex. Her intake nurse and myself spontaneously thought of a total contact cast I went back in May. At that time we really were not seeing much of an improvement with a cast although the wound was in a much different situation I would like to retry this in 2 weeks and I discussed this with the patient 08/12/2021; the patient has had some improvement with the Iodoflex. The the area on the left heel plantar more improved than the right. I had to put him in a total contact cast on the left although I decided to put that off for 2 weeks. I am going to change his primary dressing to silver collagen. I think in both areas he has had some improvement most of the healing seems to be more proximal in the heel. The wounds are in the mid aspect. A lot of thick callus on the right heel however. 1/19; we are using silver collagen on both plantar heel areas. He has had some improvement today. The left did not require any debridement. He still had some eschar on the right that was debrided but both seem to have contracted. I did not put it total contact cast on him today 2/2 we have been using silver collagen. The area on the right plantar heel has areas that appear to be epithelialized interspersed with dry flaking callus and dry skin. I removed this. This really looks better than on the other side. On the left still a large area with  raised edges and debris on the surface. 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 He has no issues or complaints today. 2/9; not much change in the left foot wound with 1 week of a cast we are using silver collagen. Silver collagen on the right side. The right side has been the better wound surface. We will reapply the total contact cast on the left 2/16; not much improvement on either side I been using silver collagen with  a total contact cast on the left. I'm changing the Hydrofera Blue still with a total contact cast on the left 2/23; some improvement on both sides. Disappointing that he has thick callus around the area that we are putting in a total contact cast on the left. We've been using Hydrofera Blue on both wound areas. This is a man who at essentially pressure ulcers in addition to ischemia caused by medications to support his blood pressure (pressors) in the ICU. He was being ventilated in the standing position for severe Covid. A [place] the wounds extended across his entire foot but are now localized to his plantar heels bilaterally. We have made progress however neither areas healed. I continue to think the total contact cast is helped albeit painstakingly slowly. He has never wanted a plastic surgery consult although I don't know that they would be interested in grafting in area in this location. 10/07/2021: Continued improvement bilaterally. There is still some callus around the left wound, despite the total contact cast. He has some increased pain in his right midfoot around 1 particular area. This has been painful in the past but seems to be a little bit worse. When his cast was removed today, there was an area on the heel of the left foot that looks a bit macerated. He is also complaining of pain in his left thigh and hip which he thinks is secondary to  the limb length discrepancy caused by the cast. 10/14/2021: He continues to improve. A little bit less callus around the left wound. He continues to endorse pain in his right midfoot, but this is not as significant as it was last week. The maceration on his left heel is improved. 10/21/2021: Continued improvement to both wounds. The maceration on his left heel is no longer evident. Less callus bilaterally. Epithelialization progressing. 10/28/2021: Significant improvement this week. The right sided wound is nearly closed with just a small open area at the middle. No maceration seen on the left heel. Continued epithelialization on both sides. No concern for infection. 11/04/2021: T oday, the wounds were measured a little bit differently and come out as larger, but I actually think they are about the same to potentially even smaller, particularly on the left. He continues to accumulate some callus on the right. 11/11/2021: T oday, the patient is expressing some concern that the left wound, despite being in the total contact cast, is not progressing at the same rate as the 1 on the right. He is interested in trying a week without the cast to see how the wound does. The wounds are roughly the same size as last week, with the right perhaps being a little bit smaller. He continues to build up callus on both sites. 11/18/2021: Last week, I permitted the patient to go without his total contact cast, just to see if the cast was really making any difference. Today, both wounds have deteriorated to some extent, suggesting that the cast is providing benefit, at least on the left. Both are larger and have accumulated callus, slough, and other debris. 11/26/2021: I debrided both wounds quite aggressively last week in an effort to stimulate the healing cascade. This appears to have been effective as the left sided wound is a full centimeter shorter in length. Although the right was measured slightly larger, on inspection,  it looks as though an area of epithelialized tissue was included in the measurements. We have been using PolyMem Ag on the wound surfaces with a total contact cast  to the left. 12/02/2021: It appears that the intake personnel are including epithelialized tissue in his wound measurements; the right wound is almost completely epithelialized; there is just a crater at the proximal midfoot with some open areas. On the left, he has built up some callus, but the overall wound surface looks good. There is some senescent skin around the wound margin. He has been in PolyMem Ag bilaterally with a total contact cast on the left. 12/09/2021: The right wound is nearly closed; there is just a small open area at the mid calcaneus. On the left, the wound is smaller with minimal callus buildup. No significant drainage. 12/16/2021: The right calcaneal wound remains minimally open at the mid calcaneus; the rest has epithelialized. On the left, the wound is also a little bit smaller. There is some senescent tissue on the periphery. He is getting his first application of a trial skin substitute called Vendaje today. 12/23/2021: The wound on his right calcaneus is nearly closed; there is just a small area at the most distal aspect of the calcaneus that is open. On the left, the area where we applied to the skin substitute has a healthier-looking bed of granulation tissue. The wound dimensions are not significantly different on this side but the wound surface is improved. 12/30/2021: The wound on the right calcaneus has not changed significantly aside from some accumulation of callus. On the left, the open area is smaller and continues to have an improved surface. He continues to accumulate callus around the wound. He is here for his third application of Vendaje. 01/06/2022: The right calcaneal wound is down to just a couple of millimeters. He continues to accumulate periwound callus. He unfortunately got his cast wet earlier in  the week and his left foot is macerated, resulting in some superficial skin loss just distal to the open wound. The open wound itself, however, is much smaller and has a healthier appearing surface. He is here for his fourth application of Vendaje. 01/13/2022: The right calcaneal wound is about the same. Unfortunately, once again, his cast got wet and his foot is again macerated. This is caused the left calcaneal wound to enlarge. He is here for his fifth application of Vendaje. 01/20/2022: The right calcaneal wound is very small. There is some periwound callus accumulation. He purchased a new cast protector last week and this has been effective in avoiding the maceration that has been occurring on the left. The left calcaneal wound is narrower and has a healthy and viable-appearing surface. He is here for his 6 application of Vendaje. 01/27/2022: The right calcaneal wound is down to just a pinhole. There is some periwound slough and callus. On the left, the wound is narrower and shorter by about a centimeter. The surface is robust and viable-appearing. Unfortunately, the rep for the trial skin substitute product did not provide any for [place] to use today. 02/04/2022: The right calcaneal wound remains unchanged. There is more accumulated callus. On the left, although the intake nurse measured it a little bit longer, it looks about the same to me. The surface has a layer of slough, but underneath this, there is good granulation tissue. 02/10/2022: The right calcaneus wound is nearly closed. There is still some callus that builds up around the site. The left side looks about the same in terms of dimensions, but the surface is more robust and vital-appearing. 02/16/2022: The area of the right calcaneus that was nearly closed last week has closed, but there is a  small opening at the mid foot where it looks like some moisture got retained and caused some reopening. The left foot wound is narrower and shallower. Both  sites have a fair amount of periwound callus and eschar. 02/24/2022: The small midfoot opening on the right calcaneus is a little bit smaller today. The left foot wound is narrower and shallower. He continues to accumulate periwound callus. No concern for infection. 03/01/2022: The patient came to clinic early because he showered and got his cast wet. Fortunately, there is no significant maceration to his foot but the callus softened and it looks like the wound on his left calcaneus may be a little bit wider. The wound on his right calcaneus is just a narrow slit. Continued accumulation of periwound callus bilaterally. 03/08/2022: The wound on his right calcaneus is very nearly closed, just a small pinpoint opening under a bit of eschar; the left wound has come in quite a bit, as well. It is narrower and shorter than at our last visit. Still with accumulated callus and eschar bilaterally. Electronic Signature(s) Signed: 03/08/2022 10:00:43 AM By: [name] Maudlin MD FACS Entered By: [name] on 03/08/2022 10:00:43 -------------------------------------------------------------------------------- Physical Exam Details Patient Name: Date of Service: [name], [place] 03/08/2022 8:45 A M Medical Record Number: [id number] Patient Account Number: [id number] Date of Birth/Sex: Treating RN: [birth year] (46 y.o. [name] Primary Care Provider: Cristie [name] Other Clinician: Referring Provider: Treating Provider/Extender: [name] in Treatment: 68 Constitutional Hypertensive, asymptomatic. . . . No acute distress.Marland Kitchen Respiratory Normal work of breathing on room air.. Notes 03/08/2022: The wound on his right calcaneus is very nearly closed, just a small pinpoint opening under a bit of eschar; the left wound has come in quite a bit, as well. It is narrower and shorter than at our last visit. Still with accumulated callus and eschar bilaterally. Electronic  Signature(s) Signed: 03/08/2022 10:02:41 AM By: [name] Maudlin MD FACS Entered By: [name] on 03/08/2022 10:02:41 -------------------------------------------------------------------------------- Physician Orders Details Patient Name: Date of Service: [name], [place] 03/08/2022 8:45 A M Medical Record Number: [id number] Patient Account Number: [id number] Date of Birth/Sex: Treating RN: Sep 12, 1975 (46 y.o. [name] Primary Care Provider: Cristie [name] Other Clinician: Referring Provider: Treating Provider/Extender: [name] in Treatment: 30 Verbal / Phone Orders: No Diagnosis Coding ICD-10 Coding Code Description L97.512 Non-pressure chronic ulcer of other part of right foot with fat layer exposed L97.522 Non-pressure chronic ulcer of other part of left foot with fat layer exposed Follow-up Appointments ppointment in 1 week. - Dr [name] - Thursday 8/10 at 1:30pm Room 2 and Room 3 Wed August 8/16 at 9:30am Return A Cellular or Tissue Based Products Wound #1 Right Calcaneus Cellular or Tissue Based Product Type: - Vendaje #11(trial) Cellular or Tissue Based Product applied to wound bed, secured with steri-strips, cover with Adaptic or Mepitel. (DO NOT REMOVE). Wound #2 Left Calcaneus Cellular or Tissue Based Product Type: - Vendaje #11(trial) Cellular or Tissue Based Product applied to wound bed, secured with steri-strips, cover with Adaptic or Mepitel. (DO NOT REMOVE). Bathing/ Shower/ Hygiene May shower with protection but do not get wound dressing(s) wet. - Cover with cast protector (can purchase cast protector at [facility] or [facility] ) Edema Control - Lymphedema / SCD / Other Bilateral Lower Extremities Elevate legs to the level of the heart or above for 30 minutes daily and/or when sitting, a frequency of: - throughout the day Avoid standing for long  periods of time. Moisturize legs daily. - right leg and foot every  night. Off-Loading Total Contact Cast to Left Lower Extremity Wedge shoe to: - Globoped Shoe to right foot Other: - keep pressure off of the bottom of your feet Additional Orders / Instructions Follow Nutritious Diet Wound Treatment Wound #1 - Calcaneus Wound Laterality: Right Cleanser: Normal Saline (Generic) 1 x Per Week/30 Days Discharge Instructions: Cleanse the wound with Normal Saline prior to applying a clean dressing using gauze sponges, not tissue or cotton balls. Cleanser: Wound Cleanser (Generic) 1 x Per Week/30 Days Discharge Instructions: Cleanse the wound with wound cleanser prior to applying a clean dressing using gauze sponges, not tissue or cotton balls. Prim Dressing: VENDAJE ary 1 x Per Week/30 Days Secondary Dressing: Optifoam Non-Adhesive Dressing, 4x4 in 1 x Per Week/30 Days Discharge Instructions: Apply over primary dressing cut to make foam border Secondary Dressing: Woven Gauze Sponge, Non-Sterile 4x4 in 1 x Per Week/30 Days Discharge Instructions: Apply over primary dressing as directed. Secured With: Elastic Bandage 4 inch (ACE bandage) (Generic) 1 x Per Week/30 Days Discharge Instructions: Secure with ACE bandage as directed. Secured With: [employer], 4.5x3.1 (in/yd) (Generic) 1 x Per Week/30 Days Discharge Instructions: Secure with Kerlix as directed. Wound #2 - Calcaneus Wound Laterality: Left Cleanser: Normal Saline (Generic) 1 x Per Week/30 Days Discharge Instructions: Cleanse the wound with Normal Saline prior to applying a clean dressing using gauze sponges, not tissue or cotton balls. Cleanser: Wound Cleanser 1 x Per Week/30 Days Discharge Instructions: Cleanse the wound with wound cleanser prior to applying a clean dressing using gauze sponges, not tissue or cotton balls. Peri-Wound Care: Zinc Oxide Ointment 30g tube 1 x Per Week/30 Days Discharge Instructions: Apply Zinc Oxide to periwound with each dressing change Prim Dressing:  VENDAJE ary 1 x Per Week/30 Days Secondary Dressing: Optifoam Non-Adhesive Dressing, 4x4 in (Generic) 1 x Per Week/30 Days Discharge Instructions: Apply over primary dressing as directed. Secondary Dressing: Woven Gauze Sponge, Non-Sterile 4x4 in 1 x Per Week/30 Days Discharge Instructions: Apply over primary dressing as directed. Secured With: 65M Medipore H Soft Cloth Surgical T ape, 4 x 10 (in/yd) (Generic) 1 x Per Week/30 Days Discharge Instructions: Secure with tape as directed. Electronic Signature(s) Signed: 03/08/2022 10:56:38 AM By: [name] Maudlin MD FACS Entered By: [name] on 03/08/2022 10:03:51 -------------------------------------------------------------------------------- Problem List Details Patient Name: Date of Service: [name], [place] 03/08/2022 8:45 A M Medical Record Number: [id number] Patient Account Number: [id number] Date of Birth/Sex: Treating RN: Apr 25, 1976 (46 y.o. [name] Primary Care Provider: Cristie [name] Other Clinician: Referring Provider: Treating Provider/Extender: [name] in Treatment: 3 Active Problems ICD-10 Encounter Code Description Active Date MDM Diagnosis L97.512 Non-pressure chronic ulcer of other part of right foot with fat layer exposed 09/03/2020 No Yes L97.522 Non-pressure chronic ulcer of other part of left foot with fat layer exposed 09/03/2020 No Yes Inactive Problems ICD-10 Code Description Active Date Inactive Date L89.893 Pressure ulcer of other site, stage 3 07/15/2020 07/15/2020 M62.81 Muscle weakness (generalized) 07/15/2020 07/15/2020 I10 Essential (primary) hypertension 07/15/2020 07/15/2020 M86.171 Other acute osteomyelitis, right ankle and foot 09/03/2020 09/03/2020 Resolved Problems Electronic Signature(s) Signed: 03/08/2022 9:59:17 AM By: [name] Maudlin MD FACS Entered By: [name] on 03/08/2022  09:59:16 -------------------------------------------------------------------------------- Progress Note Details Patient Name: Date of Service: [name], [place] 03/08/2022 8:45 A M Medical Record Number: [id number] Patient Account Number: [id number] Date of Birth/Sex: Treating RN: 03-15-1976 (46 y.o. M)  [name] Primary Care Provider: Cristie [name] Other Clinician: Referring Provider: Treating Provider/Extender: [name] in Treatment: 40 Subjective Chief Complaint Information obtained from Patient Bilateral Plantar Foot Ulcers History of Present Illness (HPI) Wounds are12/03/2020 upon evaluation today patient presents for initial inspection here in our clinic concerning issues he has been having with the bottoms of his feet bilaterally. He states these actually occurred as wounds when he was hospitalized for 5 months secondary to Covid. He was apparently with tilting bed where he was in an upright position quite frequently and apparently this occurred in some way shape or form during that time. Fortunately there is no sign of active infection at this time. No fevers, chills, nausea, vomiting, or diarrhea. With that being said he still has substantial wounds on the plantar aspects of his feet Theragen require quite a bit of work to get these to heal. He has been using Santyl currently though that is been problematic both in receiving the medication as well as actually paid for it as it is become quite expensive. Prior to the experience with Covid the patient really did not have any major medical problems other than hypertension he does have some mild generalized weakness following the Covid experience. 07/22/2020 on evaluation today patient appears to be doing okay in regard to his foot ulcers I feel like the wound beds are showing signs of better improvement that I do believe the Iodoflex is helping in this regard. With that being said he does have a lot of  drainage currently and this is somewhat blue/green in nature which is consistent with Pseudomonas. I do think a culture today would be appropriate for [place] to evaluate and see if that is indeed the case I would likely start him on antibiotic orally as well he is not allergic to Cipro knows of no issues he has had in the past 12/21; patient was admitted to the clinic earlier this month with bilateral presumed pressure ulcers on the bottom of his feet apparently related to excessive pressure from a tilt table arrangement in the intensive care unit. Patient relates this to being on ECMO but I am not really sure that is exactly related to that. I must say I have never seen anything like this. He has fairly extensive full-thickness wounds extending from his heel towards his midfoot mostly centered laterally. There is already been some healing distally. He does not appear to have an arterial issue. He has been using gentamicin to the wound surfaces with Iodoflex to help with ongoing debridement 1/6; this is a patient with pressure ulcers on the bottom of his feet related to excessive pressure from a standing position in the intensive care unit. He is complaining of a lot of pain in the right heel. He is not a diabetic. 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 We have been using Iodoflex to help prepare the surfaces of both wounds for an advanced treatment product. He is nonambulatory spending most of his time in a wheelchair I have asked him not to propel the wheelchair with his heels 1/13; in general his wounds look better not much surface area change we have been using Iodoflex as of last week. I did an x-ray of the right heel as the patient was complaining of pain. I had some thoughts about a stress fracture perhaps Achilles tendon problems however what it showed was erosive changes along the inferior aspect of the calcaneus he now has a MRI  booked for 1/20. 1/20; in general his  wounds continue to be better. Some improvement in the large narrow areas proximally in his foot. He is still complaining of pain in the right heel and tenderness in certain areas of this wound. His MRI is tonight. I am not just looking for osteomyelitis that was brought up on the x-ray I am wondering about stress fractures, tendon ruptures etc. He has no such findings on the left. Also noteworthy is that the patient had critical illness neuropathy and some of the discomfort may be actual improvement in nerve function I am just not sure. These wounds were initially in the setting of severe critical illness related to COVID-19. He was put in a standing position. He may have also been on pressors at the point contributing to tissue ischemia. By his description at some point these wounds were grossly necrotic extending proximally up into the Achilles part of his heel. I do not know that I have ever really seen pictures of them like this although they may exist in epic We have ordered Tri layer Oasis. I am trying to stimulate some granulation in these areas. This is of course assuming the MRI is negative for infection 1/27; since the patient was last here he saw Dr. Earlene Plater of infectious disease. He is planned for vancomycin and ceftriaxone. Prior operative culture grew MSSA. Also ordered baseline lab work. He also ordered arterial studies although the ABIs in our clinic were normal as well as his clinical exam these were normal I do not think he needs to see vascular surgery. His ABIs at the [facility] were 1.22 in the right triphasic waveforms with a normal TBI of 1.15 on the left ABI of 1.22 with triphasic waveforms and a normal TBI of 1.08. Finally he saw Dr. Logan Bores who will follow him in for 2 months. At this point I do not think he felt that he needed a procedure on the right calcaneal bone. Dr. Earlene Plater is elected for broad-spectrum antibiotic The patient is still having pain in the right heel. He walks with  a walker 2/3; wounds are generally smaller. He is tolerating his IV antibiotics. I believe this is vancomycin and ceftriaxone. We are still waiting for Oasis burn in terms of his out-of-pocket max which he should be meeting soon given the IV antibiotics, MRIs etc. I have asked him to check in on this. We are using silver collagen in the meantime the wounds look better 2/10; tolerating IV vancomycin and Rocephin. We are waiting to apply for Oasis. Although I am not really sure where he is in his out-of-pocket max. 2/17 started the first application of Oasis trilayer. Still on antibiotics. The wounds have generally look better. The area on the left has a little more surface slough requiring debridement 2/24; second application of Oasis trilayer. The wound surface granulation is generally look better. The area on the left with undermining laterally I think is come in a bit. 10/08/2020 upon evaluation today patient is here today for [employer] application #3. Fortunately he seems to be doing extremely well with regard to this and we are seeing a lot of new epithelial growth which is great news. Fortunately there is no signs of active infection at this time. 10/16/2020 upon evaluation today patient appears to be doing well with regard to his foot ulcers. Do believe the Oasis has been of benefit for him. I do not see any signs of infection right now which is great news and I  think that he has a lot of new epithelial growth which is great to see as well. The patient is very pleased to hear all of this. I do think we can proceed with the Oasis trilayer #4 today. 3/18; not as much improvement in these areas on his heels that I was hoping. I did reapply trilateral Oasis today the tissue looks healthier but not as much fill in as I was hoping. 3/25; better looking today I think this is come in a bit the tissue looks healthier. Triple layer Oasis reapplied #6 4/1; somewhat better looking definitely better  looking surface not as much change in surface area as I was hoping. He may be spending more time [place] on days then he needs to although he does have heel offloading boots. Triple layer Oasis reapplied #7 4/7; unfortunately apparently [name] will not approve any further Oasis which is unfortunate since the patient did respond nicely both in terms of the condition of the wound bed as well as surface area. There is still some drainage coming from the wound but not a lot there does not appear to be any infection 4/15; we have been using Hydrofera Blue. He continues to have nice rims of epithelialization on the right greater than the left. The left the epithelialization is coming from the tip of his heel. There is moderate drainage. In this that concerns me about a total contact cast. There is no evidence of infection 4/29; patient has been using Hydrofera Blue with dressing changes. He has no complaints or issues today. 5/5; using Hydrofera Blue. I actually think that he looks marginally better than the last time I saw this 3 weeks ago. There are rims of epithelialization on the left thumb coming from the medial side on the right. Using Hydrofera Blue 5/12; using Hydrofera Blue. These continue to make improvements in surface area. His drainage was not listed as severe I therefore went ahead and put a cast on the left foot. Right foot we will continue to dress his previous 5/16; back for first total contact cast change. He did not tolerate this particularly well cast injury on the anterior tibia among other issues. Difficulty sleeping. I talked him about this in some detail and afterwards is elected to continue. I told him I would like to have a cast on for 3 weeks to see if this is going to help at all. I think he agreed 5/19; I think the wound is better. There is no tunneling towards his midfoot. The undermining medially also looks better. He has a rim of new skin distally. I think we  are making progress here. The area on the left also continues to look somewhat better to me using Hydrofera Blue. He has a list of complaints about the cast but none of them seem serious 5/26; patient presents for 1 week follow-up. He has been using a total contact cast and tolerating this well. Hydrofera Blue is the main dressing used. He denies signs of infection. 6/2 Hydrofera Blue total contact cast on the left. These were large ulcers that formed in intensive care unit where the patient was recovering from COVID. May have had something to do with being ventilated in an upright positiono Pressors etc. We have been able to get the areas down considerably and a viable surface. There is some epithelialization in both sides. Note made of drainage 6/9; changed to [facility] last time because of drainage. He arrives with better looking surfaces and  dimensions on the left than the right. Paradoxically the right actually probes more towards his midfoot the left is largely close down but both of these look improved. Using a total contact cast on the left 6/16; complex wounds on his bilateral plantar heels which were initially pressure injury from a stay in the ICU with COVID. We have been using silver alginate most recently. His dimensions of come in quite dramatically however not recently. We have been putting the left foot in a total contact cast 6/23; complex wounds on the bilateral plantar heels. I been putting the left in the cast paradoxically the area on the right is the one that is going towards closure at a faster rate. Quite a bit of drainage on the left. The patient went to see Dr. Amalia Hailey who said he was going to standby for skin grafts. I had actually considered sending him for skin grafts however he would be mandatorily off his feet for a period of weeks to months. I am thinking that the area on the right is going to close on its own the area on the left has been more stubborn even though  we have him in a total contact cast 6/30; took him out of a total contact cast last week is the right heel seem to be making better progress than the left where I was placing the cast. We are using silver alginate. Both wounds are smaller right greater than left 7/12; both wounds look as though they are making some progress. We are using silver alginate. Heel offloading boots 7/26; very gradual progress especially on the right. Using silver alginate. He is wearing heel offloading boots 8/18; he continues to close these wounds down very gradually. Using silver alginate. The problem polymen being definitive about this is areas of what appears to be callus around the margins. This is not a 100% of the area but certainly sizable especially on the right 9/1; bilateral plantar feet wounds secondary to prolonged pressure while being ventilated for COVID-19 in an upright position. Essentially pressure ulcers on the bottom of his feet. He is made substantial progress using silver alginate. 9/14; bilateral plantar feet wounds secondary to prolonged pressure. Making progress using silver alginate. 9/29 bilateral plantar feet wounds secondary to prolonged pressure. I changed him to Iodoflex last week. MolecuLight showing reddened blush fluorescence 10/11; patient presents for follow-up. He has no issues or complaints today. He denies signs of infection. He continues to use Iodoflex and antibiotic ointment to the wound beds. 10/27; 2-week follow-up. No evidence of infection. He has callus and thick dry skin around the wound margins we have been using Iodoflex and Bactroban which was in response to a moderate left MolecuLight reddish blush fluorescence. 11/10; 2-week follow-up. Wound margins again have thick callus however the measurements of the actual wound sites are a lot smaller. Everything looks reasonably healthy here. We have been using Iodoflex He was approved for prime matrix but I have elected to delay  this given the improvement in the surface area. Hopefully I will not regret that decision as were getting close to the end of the year in terms of insurance payment 12/8; 2-week follow-up. Wounds are generally smaller in size. These were initially substantial wounds extending into the forefoot all the way into the heel on the bilateral plantar feet. They are now both located on the plantar heel distal aspect both of these have a lot of callus around the wounds I used a #5 curette to remove this  on the right and the left also some subcutaneous debris to try and get the wound edges were using Iodoflex. He has heel offloading shoe 12/22; 2-week follow-up. Not really much improvement. He has thick callus around the outer edges of both wounds. I remove this there is some nonviable subcutaneous tissue as well. We have been using Iodoflex. Her intake nurse and myself spontaneously thought of a total contact cast I went back in May. At that time we really were not seeing much of an improvement with a cast although the wound was in a much different situation I would like to retry this in 2 weeks and I discussed this with the patient 08/12/2021; the patient has had some improvement with the Iodoflex. The the area on the left heel plantar more improved than the right. I had to put him in a total contact cast on the left although I decided to put that off for 2 weeks. I am going to change his primary dressing to silver collagen. I think in both areas he has had some improvement most of the healing seems to be more proximal in the heel. The wounds are in the mid aspect. A lot of thick callus on the right heel however. 1/19; we are using silver collagen on both plantar heel areas. He has had some improvement today. The left did not require any debridement. He still had some eschar on the right that was debrided but both seem to have contracted. I did not put it total contact cast on him today 2/2 we have been using  silver collagen. The area on the right plantar heel has areas that appear to be epithelialized interspersed with dry flaking callus and dry skin. I removed this. This really looks better than on the other side. On the left still a large area with raised edges and debris on the surface. 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 He has no issues or complaints today. 2/9; not much change in the left foot wound with 1 week of a cast we are using silver collagen. Silver collagen on the right side. The right side has been the better wound surface. We will reapply the total contact cast on the left 2/16; not much improvement on either side I been using silver collagen with a total contact cast on the left. I'm changing the Hydrofera Blue still with a total contact cast on the left 2/23; some improvement on both sides. Disappointing that he has thick callus around the area that we are putting in a total contact cast on the left. We've been using Hydrofera Blue on both wound areas. This is a man who at essentially pressure ulcers in addition to ischemia caused by medications to support his blood pressure (pressors) in the ICU. He was being ventilated in the standing position for severe Covid. A Shiley the wounds extended across his entire foot but are now localized to his plantar heels bilaterally. We have made progress however neither areas healed. I continue to think the total contact cast is helped albeit painstakingly slowly. He has never wanted a plastic surgery consult although I don't know that they would be interested in grafting in area in this location. 10/07/2021: Continued improvement bilaterally. There is still some callus around the left wound, despite the total contact cast. He has some increased pain in his right midfoot  around 1 particular area. This has been  painful in the past but seems to be a little bit worse. When his cast was removed today, there was an area on the heel of the left foot that looks a bit macerated. He is also complaining of pain in his left thigh and hip which he thinks is secondary to the limb length discrepancy caused by the cast. 10/14/2021: He continues to improve. A little bit less callus around the left wound. He continues to endorse pain in his right midfoot, but this is not as significant as it was last week. The maceration on his left heel is improved. 10/21/2021: Continued improvement to both wounds. The maceration on his left heel is no longer evident. Less callus bilaterally. Epithelialization progressing. 10/28/2021: Significant improvement this week. The right sided wound is nearly closed with just a small open area at the middle. No maceration seen on the left heel. Continued epithelialization on both sides. No concern for infection. 11/04/2021: T oday, the wounds were measured a little bit differently and come out as larger, but I actually think they are about the same to potentially even smaller, particularly on the left. He continues to accumulate some callus on the right. 11/11/2021: T oday, the patient is expressing some concern that the left wound, despite being in the total contact cast, is not progressing at the same rate as the 1 on the right. He is interested in trying a week without the cast to see how the wound does. The wounds are roughly the same size as last week, with the right perhaps being a little bit smaller. He continues to build up callus on both sites. 11/18/2021: Last week, I permitted the patient to go without his total contact cast, just to see if the cast was really making any difference. Today, both wounds have deteriorated to some extent, suggesting that the cast is providing benefit, at least on the left. Both are larger and have accumulated callus, slough, and other debris. 11/26/2021: I debrided  both wounds quite aggressively last week in an effort to stimulate the healing cascade. This appears to have been effective as the left sided wound is a full centimeter shorter in length. Although the right was measured slightly larger, on inspection, it looks as though an area of epithelialized tissue was included in the measurements. We have been using PolyMem Ag on the wound surfaces with a total contact cast to the left. 12/02/2021: It appears that the intake personnel are including epithelialized tissue in his wound measurements; the right wound is almost completely epithelialized; there is just a crater at the proximal midfoot with some open areas. On the left, he has built up some callus, but the overall wound surface looks good. There is some senescent skin around the wound margin. He has been in PolyMem Ag bilaterally with a total contact cast on the left. 12/09/2021: The right wound is nearly closed; there is just a small open area at the mid calcaneus. On the left, the wound is smaller with minimal callus buildup. No significant drainage. 12/16/2021: The right calcaneal wound remains minimally open at the mid calcaneus; the rest has epithelialized. On the left, the wound is also a little bit smaller. There is some senescent tissue on the periphery. He is getting his first application of a trial skin substitute called Vendaje today. 12/23/2021: The wound on his right calcaneus is nearly closed; there is just a small area at the most distal aspect of the calcaneus  that is open. On the left, the area where we applied to the skin substitute has a healthier-looking bed of granulation tissue. The wound dimensions are not significantly different on this side but the wound surface is improved. 12/30/2021: The wound on the right calcaneus has not changed significantly aside from some accumulation of callus. On the left, the open area is smaller and continues to have an improved surface. He continues to  accumulate callus around the wound. He is here for his third application of Vendaje. 01/06/2022: The right calcaneal wound is down to just a couple of millimeters. He continues to accumulate periwound callus. He unfortunately got his cast wet earlier in the week and his left foot is macerated, resulting in some superficial skin loss just distal to the open wound. The open wound itself, however, is much smaller and has a healthier appearing surface. He is here for his fourth application of Vendaje. 01/13/2022: The right calcaneal wound is about the same. Unfortunately, once again, his cast got wet and his foot is again macerated. This is caused the left calcaneal wound to enlarge. He is here for his fifth application of Vendaje. 01/20/2022: The right calcaneal wound is very small. There is some periwound callus accumulation. He purchased a new cast protector last week and this has been effective in avoiding the maceration that has been occurring on the left. The left calcaneal wound is narrower and has a healthy and viable-appearing surface. He is here for his 6 application of Vendaje. 01/27/2022: The right calcaneal wound is down to just a pinhole. There is some periwound slough and callus. On the left, the wound is narrower and shorter by about a centimeter. The surface is robust and viable-appearing. Unfortunately, the rep for the trial skin substitute product did not provide any for [place] to use today. 02/04/2022: The right calcaneal wound remains unchanged. There is more accumulated callus. On the left, although the intake nurse measured it a little bit longer, it looks about the same to me. The surface has a layer of slough, but underneath this, there is good granulation tissue. 02/10/2022: The right calcaneus wound is nearly closed. There is still some callus that builds up around the site. The left side looks about the same in terms of dimensions, but the surface is more robust and  vital-appearing. 02/16/2022: The area of the right calcaneus that was nearly closed last week has closed, but there is a small opening at the mid foot where it looks like some moisture got retained and caused some reopening. The left foot wound is narrower and shallower. Both sites have a fair amount of periwound callus and eschar. 02/24/2022: The small midfoot opening on the right calcaneus is a little bit smaller today. The left foot wound is narrower and shallower. He continues to accumulate periwound callus. No concern for infection. 03/01/2022: The patient came to clinic early because he showered and got his cast wet. Fortunately, there is no significant maceration to his foot but the callus softened and it looks like the wound on his left calcaneus may be a little bit wider. The wound on his right calcaneus is just a narrow slit. Continued accumulation of periwound callus bilaterally. 03/08/2022: The wound on his right calcaneus is very nearly closed, just a small pinpoint opening under a bit of eschar; the left wound has come in quite a bit, as well. It is narrower and shorter than at our last visit. Still with accumulated callus and eschar bilaterally. Patient History  Information obtained from Patient. Family History Cancer - Maternal Grandparents, Diabetes - Father,Paternal Grandparents, Heart Disease - Maternal Grandparents, Hypertension - Father,Paternal Grandparents, Lung Disease - Siblings, No family history of Hereditary Spherocytosis, Kidney Disease, Seizures, Stroke, Thyroid Problems, Tuberculosis. Social History Never smoker, Marital Status - Married, Alcohol Use - Never, Drug Use - No History, Caffeine Use - Daily - tea, soda. Medical History Eyes Denies history of Cataracts, Glaucoma, Optic Neuritis Ear/Nose/Mouth/Throat Denies history of Chronic sinus problems/congestion, Middle ear problems Hematologic/Lymphatic Denies history of Anemia, Hemophilia, Human Immunodeficiency  Virus, Lymphedema, Sickle Cell Disease Respiratory Patient has history of Asthma Denies history of Aspiration, Chronic Obstructive Pulmonary Disease (COPD), Pneumothorax, Sleep Apnea, Tuberculosis Cardiovascular Patient has history of Angina - with COVID, Hypertension Denies history of Arrhythmia, Congestive Heart Failure, Coronary Artery Disease, Deep Vein Thrombosis, Hypotension, Myocardial Infarction, Peripheral Arterial Disease, Peripheral Venous Disease, Phlebitis, Vasculitis Gastrointestinal Denies history of Cirrhosis , Colitis, Crohnoos, Hepatitis A, Hepatitis B, Hepatitis C Endocrine Denies history of Type I Diabetes, Type II Diabetes Genitourinary Denies history of End Stage Renal Disease Immunological Denies history of Lupus Erythematosus, Raynaudoos, Scleroderma Integumentary (Skin) Denies history of History of Burn Musculoskeletal Denies history of Gout, Rheumatoid Arthritis, Osteoarthritis, Osteomyelitis Neurologic Denies history of Dementia, Neuropathy, Quadriplegia, Paraplegia, Seizure Disorder Oncologic Denies history of Received Chemotherapy, Received Radiation Psychiatric Denies history of Anorexia/bulimia, Confinement Anxiety Hospitalization/Surgery History - COVID PNA 07/22/2019- 11/14/2019. - 03/27/2020 wound debridement/ skin graft. Medical A Surgical History Notes nd Constitutional Symptoms (General Health) COVID PNA 07/22/2019-11/14/2019 [facility] ECMO, foot drop left foot , Genitourinary kidney stone Psychiatric anxiety Objective Constitutional Hypertensive, asymptomatic. No acute distress.. Vitals Time Taken: 9:00 AM, Height: 69 in, Weight: 280 lbs, BMI: 41.3, Temperature: 98.2 F, Pulse: 77 bpm, Respiratory Rate: 18 breaths/min, Blood Pressure: 157/90 mmHg. Respiratory Normal work of breathing on room air.. General Notes: 03/08/2022: The wound on his right calcaneus is very nearly closed, just a small pinpoint opening under a bit of eschar; the left  wound has come in quite a bit, as well. It is narrower and shorter than at our last visit. Still with accumulated callus and eschar bilaterally. Integumentary (Hair, Skin) Wound #1 status is Open. Original cause of wound was Pressure Injury. The date acquired was: 10/07/2019. The wound has been in treatment 85 weeks. The wound is located on the Right Calcaneus. The wound measures 0.1cm length x 0.1cm width x 0.1cm depth; 0.008cm^2 area and 0.001cm^3 volume. There is Fat Layer (Subcutaneous Tissue) exposed. There is a medium amount of serosanguineous drainage noted. The wound margin is thickened. There is small (1- 33%) pink granulation within the wound bed. There is a large (67-100%) amount of necrotic tissue within the wound bed including Eschar and Adherent Slough. Wound #2 status is Open. Original cause of wound was Pressure Injury. The date acquired was: 10/07/2019. The wound has been in treatment 85 weeks. The wound is located on the Left Calcaneus. The wound measures 0.3cm length x 0.4cm width x 0.1cm depth; 0.094cm^2 area and 0.009cm^3 volume. There is Fat Layer (Subcutaneous Tissue) exposed. There is no tunneling or undermining noted. There is a medium amount of serosanguineous drainage noted. The wound margin is distinct with the outline attached to the wound base. There is medium (34-66%) red granulation within the wound bed. There is a medium (34-66%) amount of necrotic tissue within the wound bed including Eschar and Adherent Slough. Assessment Active Problems ICD-10 Non-pressure chronic ulcer of other part of right foot with fat layer exposed Non-pressure chronic ulcer  of other part of left foot with fat layer exposed Procedures Wound #1 Pre-procedure diagnosis of Wound #1 is a Pressure Ulcer located on the Right Calcaneus . There was a Selective/Open Wound Non-Viable Tissue Debridement with a total area of 0.01 sq cm performed by [name] Maudlin, MD. With the following instrument(s):  Curette to remove Non-Viable tissue/material. Material removed includes Eschar and Slough and after achieving pain control using Lidocaine 5% topical ointment. No specimens were taken. A time out was conducted at 09:35, prior to the start of the procedure. A Minimum amount of bleeding was controlled with Pressure. The procedure was tolerated well with a pain level of 0 throughout and a pain level of 0 following the procedure. Post Debridement Measurements: 0.1cm length x 0.1cm width x 0.1cm depth; 0.001cm^3 volume. Post debridement Stage noted as Category/Stage III. Character of Wound/Ulcer Post Debridement is improved. Post procedure Diagnosis Wound #1: Same as Pre-Procedure Pre-procedure diagnosis of Wound #1 is a Pressure Ulcer located on the Right Calcaneus. A skin graft procedure using a bioengineered skin substitute/cellular or tissue based product was performed by [name] Maudlin, MD with the following instrument(s): Forceps and Scissors. Other was applied and secured with Steri-Strips. 4 sq cm of product was utilized and 0 sq cm was wasted. Post Application, Adaptic;gauze was applied. A Time Out was conducted at 09:35, prior to the start of the procedure. The procedure was tolerated well with a pain level of 0 throughout and a pain level of 0 following the procedure. Post procedure Diagnosis Wound #1: Same as Pre-Procedure General Notes: Vendaje. Wound #2 Pre-procedure diagnosis of Wound #2 is a Pressure Ulcer located on the Left Calcaneus. A skin graft procedure using a bioengineered skin substitute/cellular or tissue based product was performed by [name] Maudlin, MD with the following instrument(s): Forceps and Scissors. Other was applied and secured with Steri-Strips. 4 sq cm of product was utilized and 0 sq cm was wasted due to Not needed because wound has decreased in size. Post Application, Adaptic;gauze was applied. A Time Out was conducted at 09:35, prior to the start of the  procedure. The procedure was tolerated well with a pain level of 0 throughout and a pain level of 0 following the procedure. Post procedure Diagnosis Wound #2: Same as Pre-Procedure General Notes: **** Vendaje******Trial. Pre-procedure diagnosis of Wound #2 is a Pressure Ulcer located on the Left Calcaneus . There was a T Contact Cast Procedure by [name] Maudlin, MD. otal Post procedure Diagnosis Wound #2: Same as Pre-Procedure Plan Follow-up Appointments: Return Appointment in 1 week. - Dr [name] - Thursday 8/10 at 1:30pm Room 2 and Room 3 Wed August 8/16 at 9:30am Cellular or Tissue Based Products: Wound #1 Right Calcaneus: Cellular or Tissue Based Product Type: - Vendaje #11(trial) Cellular or Tissue Based Product applied to wound bed, secured with steri-strips, cover with Adaptic or Mepitel. (DO NOT REMOVE). Wound #2 Left Calcaneus: Cellular or Tissue Based Product Type: - Vendaje #11(trial) Cellular or Tissue Based Product applied to wound bed, secured with steri-strips, cover with Adaptic or Mepitel. (DO NOT REMOVE). Bathing/ Shower/ Hygiene: May shower with protection but do not get wound dressing(s) wet. - Cover with cast protector (can purchase cast protector at [facility] or [facility] ) Edema Control - Lymphedema / SCD / Other: Elevate legs to the level of the heart or above for 30 minutes daily and/or when sitting, a frequency of: - throughout the day Avoid standing for long periods of time. Moisturize legs daily. - right leg and  foot every night. Off-Loading: T Contact Cast to Left Lower Extremity otal Wedge shoe to: - Globoped Shoe to right foot Other: - keep pressure off of the bottom of your feet Additional Orders / Instructions: Follow Nutritious Diet WOUND #1: - Calcaneus Wound Laterality: Right Cleanser: Normal Saline (Generic) 1 x Per Week/30 Days Discharge Instructions: Cleanse the wound with Normal Saline prior to applying a clean dressing using gauze sponges, not  tissue or cotton balls. Cleanser: Wound Cleanser (Generic) 1 x Per Week/30 Days Discharge Instructions: Cleanse the wound with wound cleanser prior to applying a clean dressing using gauze sponges, not tissue or cotton balls. Prim Dressing: VENDAJE 1 x Per Week/30 Days ary Secondary Dressing: Optifoam Non-Adhesive Dressing, 4x4 in 1 x Per Week/30 Days Discharge Instructions: Apply over primary dressing cut to make foam border Secondary Dressing: Woven Gauze Sponge, Non-Sterile 4x4 in 1 x Per Week/30 Days Discharge Instructions: Apply over primary dressing as directed. Secured With: Elastic Bandage 4 inch (ACE bandage) (Generic) 1 x Per Week/30 Days Discharge Instructions: Secure with ACE bandage as directed. Secured With: [employer], 4.5x3.1 (in/yd) (Generic) 1 x Per Week/30 Days Discharge Instructions: Secure with Kerlix as directed. WOUND #2: - Calcaneus Wound Laterality: Left Cleanser: Normal Saline (Generic) 1 x Per Week/30 Days Discharge Instructions: Cleanse the wound with Normal Saline prior to applying a clean dressing using gauze sponges, not tissue or cotton balls. Cleanser: Wound Cleanser 1 x Per Week/30 Days Discharge Instructions: Cleanse the wound with wound cleanser prior to applying a clean dressing using gauze sponges, not tissue or cotton balls. Peri-Wound Care: Zinc Oxide Ointment 30g tube 1 x Per Week/30 Days Discharge Instructions: Apply Zinc Oxide to periwound with each dressing change Prim Dressing: VENDAJE 1 x Per Week/30 Days ary Secondary Dressing: Optifoam Non-Adhesive Dressing, 4x4 in (Generic) 1 x Per Week/30 Days Discharge Instructions: Apply over primary dressing as directed. Secondary Dressing: Woven Gauze Sponge, Non-Sterile 4x4 in 1 x Per Week/30 Days Discharge Instructions: Apply over primary dressing as directed. Secured With: 10M Medipore H Soft Cloth Surgical T ape, 4 x 10 (in/yd) (Generic) 1 x Per Week/30 Days Discharge Instructions: Secure  with tape as directed. 03/08/2022: The wound on his right calcaneus is very nearly closed, just a small pinpoint opening under a bit of eschar; the left wound has come in quite a bit, as well. It is narrower and shorter than at our last visit. Still with accumulated callus and eschar bilaterally. I used a curette to debride the eschar from the wound beds and then applied the donated skin substitute (Vendaje, no charge). It was secured in place with Adaptic and Steri-Strips. Gauze sponges were used as bolsters to hold the product in good contact with the wound surface. A total contact cast was applied on the left. Follow-up in 1 week. Electronic Signature(s) Signed: 03/08/2022 5:54:25 PM By: [name] Catholic RN Signed: 03/14/2022 7:49:21 AM By: [name] Maudlin MD FACS Previous Signature: 03/08/2022 10:05:22 AM Version By: [name] Maudlin MD FACS Entered By: [name] on 03/08/2022 17:52:21 -------------------------------------------------------------------------------- HxROS Details Patient Name: Date of Service: [name], [place] 03/08/2022 8:45 A M Medical Record Number: [id number] Patient Account Number: [id number] Date of Birth/Sex: Treating RN: 07-06-1976 (46 y.o. [name] Primary Care Provider: Cristie [name] Other Clinician: Referring Provider: Treating Provider/Extender: [name] in Treatment: 37 Information Obtained From Patient Constitutional Symptoms ([place]) Medical History: Past Medical History Notes: COVID PNA 07/22/2019-11/14/2019 VENT ECMO, foot drop left foot ,  Eyes Medical History: Negative for: Cataracts; Glaucoma; Optic Neuritis Ear/Nose/Mouth/Throat Medical History: Negative for: Chronic sinus problems/congestion; Middle ear problems Hematologic/Lymphatic Medical History: Negative for: Anemia; Hemophilia; Human Immunodeficiency Virus; Lymphedema; Sickle Cell Disease Respiratory Medical History: Positive for:  Asthma Negative for: Aspiration; Chronic Obstructive Pulmonary Disease (COPD); Pneumothorax; Sleep Apnea; Tuberculosis Cardiovascular Medical History: Positive for: Angina - with COVID; Hypertension Negative for: Arrhythmia; Congestive Heart Failure; Coronary Artery Disease; Deep Vein Thrombosis; Hypotension; Myocardial Infarction; Peripheral Arterial Disease; Peripheral Venous Disease; Phlebitis; Vasculitis Gastrointestinal Medical History: Negative for: Cirrhosis ; Colitis; Crohns; Hepatitis A; Hepatitis B; Hepatitis C Endocrine Medical History: Negative for: Type I Diabetes; Type II Diabetes Genitourinary Medical History: Negative for: End Stage Renal Disease Past Medical History Notes: kidney stone Immunological Medical History: Negative for: Lupus Erythematosus; Raynauds; Scleroderma Integumentary (Skin) Medical History: Negative for: History of Burn Musculoskeletal Medical History: Negative for: Gout; Rheumatoid Arthritis; Osteoarthritis; Osteomyelitis Neurologic Medical History: Negative for: Dementia; Neuropathy; Quadriplegia; Paraplegia; Seizure Disorder Oncologic Medical History: Negative for: Received Chemotherapy; Received Radiation Psychiatric Medical History: Negative for: Anorexia/bulimia; Confinement Anxiety Past Medical History Notes: anxiety Immunizations Pneumococcal Vaccine: Received Pneumococcal Vaccination: No Implantable Devices None Hospitalization / Surgery History Type of Hospitalization/Surgery COVID PNA 07/22/2019- 11/14/2019 03/27/2020 wound debridement/ skin graft Family and Social History Cancer: Yes - Maternal Grandparents; Diabetes: Yes - Father,Paternal Grandparents; Heart Disease: Yes - Maternal Grandparents; Hereditary Spherocytosis: No; Hypertension: Yes - Father,Paternal Grandparents; Kidney Disease: No; Lung Disease: Yes - Siblings; Seizures: No; Stroke: No; Thyroid Problems: No; Tuberculosis: No; Never smoker; Marital Status -  Married; Alcohol Use: Never; Drug Use: No History; Caffeine Use: Daily - tea, soda; Financial Concerns: No; Food, Clothing or Shelter Needs: No; Support System Lacking: No; Transportation Concerns: No Electronic Signature(s) Signed: 03/08/2022 10:56:38 AM By: [name] Maudlin MD FACS Signed: 03/08/2022 5:54:25 PM By: [name] Catholic RN Entered By: [name] on 03/08/2022 10:01:39 -------------------------------------------------------------------------------- Total Contact Cast Details Patient Name: Date of Service: [name], [place] 03/08/2022 8:45 A M Medical Record Number: [id number] Patient Account Number: [id number] Date of Birth/Sex: Treating RN: 09-24-1975 (46 y.o. [name] Primary Care Provider: Cristie [name] Other Clinician: Referring Provider: Treating Provider/Extender: [name] in Treatment: 82 T Contact Cast Applied for Wound Assessment: otal Wound #2 Left Calcaneus Performed By: Physician [name] Maudlin, MD Post Procedure Diagnosis Same as Pre-procedure Electronic Signature(s) Signed: 03/08/2022 10:56:38 AM By: [name] Maudlin MD FACS Signed: 03/08/2022 5:54:25 PM By: [name] Catholic RN Entered By: [name] on 03/08/2022 09:42:09 -------------------------------------------------------------------------------- [place] Details Patient Name: Date of Service: [name], [place]. 03/08/2022 Medical Record Number: [id number] Patient Account Number: [id number] Date of Birth/Sex: Treating RN: [birth year] (46 y.o. [name] Primary Care Provider: Cristie [name] Other Clinician: Referring Provider: Treating Provider/Extender: [name] in Treatment: 34 Diagnosis Coding ICD-10 Codes Code Description [phone] Non-pressure chronic ulcer of other part of right foot with fat layer exposed L97.522 Non-pressure chronic ulcer of other part of left foot with fat layer exposed Facility Procedures CPT4 Code:  [id number] Description: [id number] - SKIN SUB GRAFT TRNK/ARM/LEG ICD-10 Diagnosis Description L97.512 Non-pressure chronic ulcer of other part of right foot with fat layer exposed Modifier: Quantity: 1 CPT4 Code: [id number] Description: [id number] - SKIN SUB GRAFT TRNK/ARM/LEG ICD-10 Diagnosis Description L97.522 Non-pressure chronic ulcer of other part of left foot with fat layer exposed Modifier: Quantity: 1 CPT4 Code: [id number] Description: [id number] - APPLY TOTAL CONTACT LEG CAST ICD-10 Diagnosis Description L97.522 Non-pressure chronic ulcer of other part of left  foot with fat layer exposed Modifier: Quantity: 1 Physician Procedures : CPT4 Code Description Modifier [id number] - WC PHYS LEVEL 4 - EST PT 25 ICD-10 Diagnosis Description L97.512 Non-pressure chronic ulcer of other part of right foot with fat layer exposed L97.522 Non-pressure chronic ulcer of other part of left foot  with fat layer exposed Quantity: 1 : [id number] - WC PHYS SKIN SUB GRAFT TRNK/ARM/LEG 1 ICD-10 Diagnosis Description L97.512 Non-pressure chronic ulcer of other part of right foot with fat layer exposed Quantity: : [id number] - WC PHYS SKIN SUB GRAFT TRNK/ARM/LEG 1 ICD-10 Diagnosis Description L97.522 Non-pressure chronic ulcer of other part of left foot with fat layer exposed Quantity: : [id number] - WC PHYS APPLY TOTAL CONTACT CAST 1 ICD-10 Diagnosis Description L97.522 Non-pressure chronic ulcer of other part of left foot with fat layer exposed Quantity: Electronic Signature(s) Signed: 03/08/2022 10:06:32 AM By: [name] Maudlin MD FACS Entered By: [name] on 03/08/2022 10:06:32

## 2022-03-15 DIAGNOSIS — L89620 Pressure ulcer of left heel, unstageable: Secondary | ICD-10-CM | POA: Diagnosis not present

## 2022-03-15 DIAGNOSIS — G8929 Other chronic pain: Secondary | ICD-10-CM | POA: Diagnosis not present

## 2022-03-15 DIAGNOSIS — L89610 Pressure ulcer of right heel, unstageable: Secondary | ICD-10-CM | POA: Diagnosis not present

## 2022-03-15 DIAGNOSIS — J309 Allergic rhinitis, unspecified: Secondary | ICD-10-CM | POA: Diagnosis not present

## 2022-03-15 DIAGNOSIS — J45909 Unspecified asthma, uncomplicated: Secondary | ICD-10-CM | POA: Diagnosis not present

## 2022-03-15 DIAGNOSIS — G4700 Insomnia, unspecified: Secondary | ICD-10-CM | POA: Diagnosis not present

## 2022-03-15 DIAGNOSIS — F3342 Major depressive disorder, recurrent, in full remission: Secondary | ICD-10-CM | POA: Diagnosis not present

## 2022-03-15 DIAGNOSIS — G629 Polyneuropathy, unspecified: Secondary | ICD-10-CM | POA: Diagnosis not present

## 2022-03-15 DIAGNOSIS — Z6841 Body Mass Index (BMI) 40.0 and over, adult: Secondary | ICD-10-CM | POA: Diagnosis not present

## 2022-03-15 DIAGNOSIS — I10 Essential (primary) hypertension: Secondary | ICD-10-CM | POA: Diagnosis not present

## 2022-03-15 DIAGNOSIS — K219 Gastro-esophageal reflux disease without esophagitis: Secondary | ICD-10-CM | POA: Diagnosis not present

## 2022-03-17 ENCOUNTER — Encounter (HOSPITAL_BASED_OUTPATIENT_CLINIC_OR_DEPARTMENT_OTHER): Payer: PPO | Admitting: General Surgery

## 2022-03-17 ENCOUNTER — Other Ambulatory Visit: Payer: Self-pay | Admitting: Behavioral Health

## 2022-03-17 DIAGNOSIS — L97522 Non-pressure chronic ulcer of other part of left foot with fat layer exposed: Secondary | ICD-10-CM | POA: Diagnosis not present

## 2022-03-17 DIAGNOSIS — F331 Major depressive disorder, recurrent, moderate: Secondary | ICD-10-CM

## 2022-03-17 DIAGNOSIS — F39 Unspecified mood [affective] disorder: Secondary | ICD-10-CM

## 2022-03-17 DIAGNOSIS — L89620 Pressure ulcer of left heel, unstageable: Secondary | ICD-10-CM | POA: Diagnosis not present

## 2022-03-17 DIAGNOSIS — F411 Generalized anxiety disorder: Secondary | ICD-10-CM

## 2022-03-18 NOTE — Progress Notes (Signed)
[name], [name] ([id number]) Visit Report for 03/17/2022 Arrival Information Details Patient Name: Date of Service: [name], TO [name] 03/17/2022 1:30 PM Medical Record Number: [id number] Patient Account Number: [id number] Date of Birth/Sex: Treating RN: 09-01-1975 (46 y.o. [name] Primary Care Keldric Poyer: Dorinda Hill Other Clinician: Referring Torsten Weniger: Treating Aylana Hirschfeld/Extender: Jana Hakim in Treatment: 33 Visit Information History Since Last Visit Added or deleted any medications: No Patient Arrived: Wheel Chair Any new allergies or adverse reactions: No Arrival Time: 13:57 Had a fall or experienced change in No Accompanied By: mother activities of daily living that may affect Transfer Assistance: None risk of falls: Patient Identification Verified: Yes Signs or symptoms of abuse/neglect since last visito No Secondary Verification Process Completed: Yes Hospitalized since last visit: No Patient Requires Transmission-Based Precautions: No Implantable device outside of the clinic excluding No Patient Has Alerts: No cellular tissue based products placed in the center since last visit: Has Dressing in Place as Prescribed: Yes Has Footwear/Offloading in Place as Prescribed: Yes Left: T Contact Cast otal Pain Present Now: Yes Electronic Signature(s) Signed: 03/17/2022 4:41:51 PM By: Redmond Pulling RN, BSN Entered By: Redmond Pulling on 03/17/2022 13:58:19 -------------------------------------------------------------------------------- Encounter Discharge Information Details Patient Name: Date of Service: [name], [name] 03/17/2022 1:30 PM Medical Record Number: [id number] Patient Account Number: [id number] Date of Birth/Sex: Treating RN: 1976/05/04 (46 y.o. [name] Primary Care [name]: Dorinda Hill Other Clinician: Referring Kaytee Taliercio: Treating Marvie Calender/Extender: Jana Hakim in Treatment: 48 Encounter  Discharge Information Items Discharge Condition: Stable Ambulatory Status: Wheelchair Discharge Destination: Home Transportation: Private Auto Accompanied By: mother Schedule Follow-up Appointment: Yes Clinical Summary of Care: Patient Declined Electronic Signature(s) Signed: 03/17/2022 4:41:51 PM By: Redmond Pulling RN, BSN Entered By: Redmond Pulling on 03/17/2022 16:27:50 -------------------------------------------------------------------------------- Lower Extremity Assessment Details Patient Name: Date of Service: [name], TO [name] 03/17/2022 1:30 PM Medical Record Number: [id number] Patient Account Number: [id number] Date of Birth/Sex: Treating RN: 09-14-1975 (46 y.o. [name] Primary Care Haisley Arens: Dorinda Hill Other Clinician: Referring Alphonza Tramell: Treating Milagros Middendorf/Extender: Jana Hakim in Treatment: 87 Edema Assessment Assessed: [name]: No] [Right: No] Edema: [Left: No] [Right: No] Calf Left: Right: Point of Measurement: 29 cm From Medial Instep 40.5 cm 43.8 cm Ankle Left: Right: Point of Measurement: 9 cm From Medial Instep 24.5 cm 24 cm Vascular Assessment Pulses: Dorsalis Pedis Palpable: [Left:Yes] [Right:Yes] Electronic Signature(s) Signed: 03/17/2022 4:41:51 PM By: Redmond Pulling RN, BSN Entered By: Redmond Pulling on 03/17/2022 14:15:33 -------------------------------------------------------------------------------- Multi Wound Chart Details Patient Name: Date of Service: [name], [name] 03/17/2022 1:30 PM Medical Record Number: [id number] Patient Account Number: [id number] Date of Birth/Sex: Treating RN: May 25, 1976 (46 y.o. [name] Primary Care Ramaj Frangos: Dorinda Hill Other Clinician: Referring [name]: Treating Alric Geise/Extender: Jana Hakim in Treatment: 10 Vital Signs Height(in): 69 Pulse(bpm): 84 Weight(lbs): 280 Blood Pressure(mmHg): 151/81 Body Mass Index(BMI):  41.3 Temperature(F): 98.3 Respiratory Rate(breaths/min): 18 Photos: [1:Right Calcaneus] [2:Left Calcaneus] [N/A:N/A N/A] Wound Location: [1:Pressure Injury] [2:Pressure Injury] [N/A:N/A] Wounding Event: [1:Pressure Ulcer] [2:Pressure Ulcer] [N/A:N/A] Primary Etiology: [1:Asthma, Angina, Hypertension] [2:Asthma, Angina, Hypertension] [N/A:N/A] Comorbid History: [1:10/07/2019] [2:10/07/2019] [N/A:N/A] Date Acquired: [1:87] [2:87] [N/A:N/A] Weeks of Treatment: [1:Healed - Epithelialized] [2:Open] [N/A:N/A] Wound Status: [1:No] [2:No] [N/A:N/A] Wound Recurrence: [1:0x0x0] [2:1.5x0.5x0.2] [N/A:N/A] Measurements L x W x D (cm) [1:0] [2:0.589] [N/A:N/A] A (cm) : rea [1:0] [2:0.118] [N/A:N/A] Volume (cm) : [1:100.00%] [2:97.80%] [N/A:N/A] % Reduction in A rea: [1:100.00%] [2:99.60%] [N/A:N/A] % Reduction  in Volume: [1:Category/Stage III] [2:Category/Stage III] [N/A:N/A] Classification: [1:None Present] [2:Medium] [N/A:N/A] Exudate A mount: [1:N/A] [2:Serosanguineous] [N/A:N/A] Exudate Type: [1:N/A] [2:red, brown] [N/A:N/A] Exudate Color: [1:Thickened] [2:Distinct, outline attached] [N/A:N/A] Wound Margin: [1:None Present (0%)] [2:Medium (34-66%)] [N/A:N/A] Granulation A mount: [1:N/A] [2:Red] [N/A:N/A] Granulation Quality: [1:None Present (0%)] [2:Medium (34-66%)] [N/A:N/A] Necrotic A mount: [1:N/A] [2:Eschar, Adherent Slough] [N/A:N/A] Necrotic Tissue: [1:Fascia: No] [2:Fat Layer (Subcutaneous Tissue): Yes N/A] Exposed Structures: [1:Fat Layer (Subcutaneous Tissue): No Tendon: No Muscle: No Joint: No Bone: No Large (67-100%)] [2:Fascia: No Tendon: No Muscle: No Joint: No Bone: No Large (67-100%)] [N/A:N/A] Epithelialization: [1:N/A] [2:T Contact Cast otal] [N/A:N/A] Treatment Notes Electronic Signature(s) Signed: 03/17/2022 3:22:40 PM By: [name] MD FACS Signed: 03/18/2022 4:07:48 PM By: Samuella Bruin Entered By: [name] on 03/17/2022  15:22:40 -------------------------------------------------------------------------------- Multi-Disciplinary Care Plan Details Patient Name: Date of Service: [name], TO [name] 03/17/2022 1:30 PM Medical Record Number: [id number] Patient Account Number: [id number] Date of Birth/Sex: Treating RN: [birth year] (46 y.o. [name] Primary Care Laparis Durrett: Dorinda Hill Other Clinician: Referring Staphanie Harbison: Treating Aliyah Abeyta/Extender: Jana Hakim in Treatment: 50 Multidisciplinary Care Plan reviewed with physician Active Inactive Wound/Skin Impairment Nursing Diagnoses: Impaired tissue integrity Knowledge deficit related to ulceration/compromised skin integrity Goals: Patient/caregiver will verbalize understanding of skin care regimen Date Initiated: 07/15/2020 Target Resolution Date: 07/07/2022 Goal Status: Active Ulcer/skin breakdown will have a volume reduction of 30% by week 4 Date Initiated: 07/15/2020 Date Inactivated: 08/20/2020 Target Resolution Date: 09/03/2020 Goal Status: Unmet Unmet Reason: no major changes. Ulcer/skin breakdown will heal within 14 weeks Date Initiated: 12/04/2020 Date Inactivated: 12/10/2020 Target Resolution Date: 12/10/2020 Unmet Reason: wounds still open at 14 Goal Status: Unmet weeks and today 21 weeks. Interventions: Assess patient/caregiver ability to obtain necessary supplies Assess patient/caregiver ability to perform ulcer/skin care regimen upon admission and as needed Assess ulceration(s) every visit Provide education on ulcer and skin care Treatment Activities: Skin care regimen initiated : 07/15/2020 Topical wound management initiated : 07/15/2020 Notes: Electronic Signature(s) Signed: 03/17/2022 4:41:51 PM By: Redmond Pulling RN, BSN Entered By: Redmond Pulling on 03/17/2022 16:26:37 -------------------------------------------------------------------------------- Pain Assessment Details Patient Name: Date of  Service: [name], [name] 03/17/2022 1:30 PM Medical Record Number: [id number] Patient Account Number: [id number] Date of Birth/Sex: Treating RN: [birth year] (46 y.o. [name] Primary Care Erhard Senske: Dorinda Hill Other Clinician: Referring Evelin Cake: Treating Iyania Denne/Extender: Jana Hakim in Treatment: 39 Active Problems Location of Pain Severity and Description of Pain Patient Has Paino Yes Site Locations Rate the pain. Current Pain Level: 2 Pain Management and Medication Current Pain Management: Electronic Signature(s) Signed: 03/17/2022 4:41:51 PM By: Redmond Pulling RN, BSN Entered By: Redmond Pulling on 03/17/2022 13:59:09 -------------------------------------------------------------------------------- Patient/Caregiver Education Details Patient Name: Date of Service: Mart Piggs 8/10/2023andnbsp1:30 PM Medical Record Number: [id number] Patient Account Number: [id number] Date of Birth/Gender: Treating RN: [birth year] (46 y.o. [name] Primary Care Physician: Dorinda Hill Other Clinician: Referring Physician: Treating Physician/Extender: Jana Hakim in Treatment: 75 Education Assessment Education Provided To: Patient Education Topics Provided Wound/Skin Impairment: Methods: Explain/Verbal Responses: State content correctly [employer]) Signed: 03/17/2022 4:41:51 PM By: Redmond Pulling RN, BSN Entered By: Redmond Pulling on 03/17/2022 16:26:54 -------------------------------------------------------------------------------- Wound Assessment Details Patient Name: Date of Service: [name], [name] 03/17/2022 1:30 PM Medical Record Number: [id number] Patient Account Number: [id number] Date of Birth/Sex: Treating RN: 14-Nov-1975 (46 y.o. [name] Primary Care Xsavier Seeley: Dorinda Hill Other Clinician: Referring Jossilyn Benda: Treating Hadli Vandemark/Extender:  Fredirick Maudlin [name] Weeks in  Treatment: 87 Wound Status Wound Number: 1 Primary Etiology: Pressure Ulcer Wound Location: Right Calcaneus Wound Status: Healed - Epithelialized Wounding Event: Pressure Injury Comorbid History: Asthma, Angina, Hypertension Date Acquired: 10/07/2019 Weeks Of Treatment: 87 Clustered Wound: No Photos Wound Measurements Length: (cm) Width: (cm) Depth: (cm) Area: (cm) Volume: (cm) 0 % Reduction in Area: 100% 0 % Reduction in Volume: 100% 0 Epithelialization: Large (67-100%) 0 Tunneling: No 0 Undermining: No Wound Description Classification: Category/Stage III Wound Margin: Thickened Exudate Amount: None Present Foul Odor After Cleansing: No Slough/Fibrino No Wound Bed Granulation Amount: None Present (0%) Exposed Structure Necrotic Amount: None Present (0%) Fascia Exposed: No Fat Layer (Subcutaneous Tissue) Exposed: No Tendon Exposed: No Muscle Exposed: No Joint Exposed: No Bone Exposed: No Electronic Signature(s) Signed: 03/17/2022 4:41:51 PM By: Sharyn Creamer RN, BSN Entered By: Sharyn Creamer on 03/17/2022 14:37:12 -------------------------------------------------------------------------------- Wound Assessment Details Patient Name: Date of Service: [name], [place] 03/17/2022 1:30 PM Medical Record Number: [id number] Patient Account Number: [id number] Date of Birth/Sex: Treating RN: [birth year] (46 y.o. [name] Primary Care Undrea Shipes: [name] Other Clinician: Referring Hodge Stachnik: Treating Naleyah Ohlinger/Extender: [name] in Treatment: 87 Wound Status Wound Number: 2 Primary Etiology: Pressure Ulcer Wound Location: Left Calcaneus Wound Status: Open Wounding Event: Pressure Injury Comorbid History: Asthma, Angina, Hypertension Date Acquired: 10/07/2019 Weeks Of Treatment: 87 Clustered Wound: No Photos Wound Measurements Length: (cm) 1.5 Width: (cm) 0.5 Depth: (cm) 0.2 Area: (cm) 0.589 Volume: (cm) 0.118 % Reduction  in Area: 97.8% % Reduction in Volume: 99.6% Epithelialization: Large (67-100%) Tunneling: No Undermining: No Wound Description Classification: Category/Stage III Wound Margin: Distinct, outline attached Exudate Amount: Medium Exudate Type: Serosanguineous Exudate Color: red, brown Foul Odor After Cleansing: No Slough/Fibrino Yes Wound Bed Granulation Amount: Medium (34-66%) Exposed Structure Granulation Quality: Red Fascia Exposed: No Necrotic Amount: Medium (34-66%) Fat Layer (Subcutaneous Tissue) Exposed: Yes Necrotic Quality: Eschar, Adherent Slough Tendon Exposed: No Muscle Exposed: No Joint Exposed: No Bone Exposed: No Treatment Notes Wound #2 (Calcaneus) Wound Laterality: Left Cleanser Normal Saline Discharge Instruction: Cleanse the wound with Normal Saline prior to applying a clean dressing using gauze sponges, not tissue or cotton balls. Wound Cleanser Discharge Instruction: Cleanse the wound with wound cleanser prior to applying a clean dressing using gauze sponges, not tissue or cotton balls. Peri-Wound Care Zinc Oxide Ointment 30g tube Discharge Instruction: Apply Zinc Oxide to periwound with each dressing change Topical Gentamicin Discharge Instruction: As directed by physician Primary Dressing KerraCel Ag Gelling Fiber Dressing, 4x5 in (silver alginate) Discharge Instruction: Apply silver alginate to wound bed as instructed Secondary Dressing Optifoam Non-Adhesive Dressing, 4x4 in Discharge Instruction: Apply over primary dressing as directed. Woven Gauze Sponge, Non-Sterile 4x4 in Discharge Instruction: Apply over primary dressing as directed. Secured With 51M Medipore H Soft Cloth Surgical T ape, 4 x 10 (in/yd) Discharge Instruction: Secure with tape as directed. Compression Wrap Compression Stockings Add-Ons Electronic Signature(s) Signed: 03/17/2022 2:50:48 PM By: Blanche East RN Signed: 03/17/2022 4:41:51 PM By: Sharyn Creamer RN, BSN Entered By:  Blanche East on 03/17/2022 14:21:06 -------------------------------------------------------------------------------- Vitals Details Patient Name: Date of Service: [name], [place] 03/17/2022 1:30 PM Medical Record Number: [id number] Patient Account Number: [id number] Date of Birth/Sex: Treating RN: 06/08/1976 (46 y.o. [name] Primary Care Gene Glazebrook: [name] Other Clinician: Referring Greogry Goodwyn: Treating Curly Mackowski/Extender: [name] in Treatment: 73 Vital Signs Time Taken: 13:58 Temperature (F): 98.3 Height (in): 69 Pulse (bpm):  84 Weight (lbs): 280 Respiratory Rate (breaths/min): 18 Body Mass Index (BMI): 41.3 Blood Pressure (mmHg): 151/81 Reference Range: 80 - 120 mg / dl Electronic Signature(s) Signed: 03/17/2022 4:41:51 PM By: Sharyn Creamer RN, BSN Signed: 03/17/2022 4:41:51 PM By: Sharyn Creamer RN, BSN Entered By: Sharyn Creamer on 03/17/2022 13:58:58

## 2022-03-18 NOTE — Progress Notes (Signed)
FRUTOSO, MUSSELMAN ([id number]) Visit Report for 03/17/2022 Chief Complaint Document Details Patient Name: Date of Service: [name], [place] 03/17/2022 1:30 PM Medical Record Number: [id number] Patient Account Number: [id number] Date of Birth/Sex: Treating RN: 1976-03-22 (46 y.o. [name] Primary Care Provider: [name] Other Clinician: Referring Provider: Treating Provider/Extender: [name] in Treatment: 62 Information Obtained from: Patient Chief Complaint Bilateral Plantar Foot Ulcers Electronic Signature(s) Signed: 03/17/2022 3:22:47 PM By: Fredirick Maudlin MD FACS Entered By: Fredirick Maudlin on 03/17/2022 15:22:47 -------------------------------------------------------------------------------- HPI Details Patient Name: Date of Service: [name], [place]. 03/17/2022 1:30 PM Medical Record Number: [id number] Patient Account Number: [id number] Date of Birth/Sex: Treating RN: 20-Aug-1975 (46 y.o. [name] Primary Care Provider: [name] Other Clinician: Referring Provider: Treating Provider/Extender: [name] in Treatment: 76 History of Present Illness HPI Description: Wounds are12/03/2020 upon evaluation today patient presents for initial inspection here in our clinic concerning issues he has been having with the bottoms of his feet bilaterally. He states these actually occurred as wounds when he was hospitalized for 5 months secondary to Covid. He was apparently with tilting bed where he was in an upright position quite frequently and apparently this occurred in some way shape or form during that time. Fortunately there is no sign of active infection at this time. No fevers, chills, nausea, vomiting, or diarrhea. With that being said he still has substantial wounds on the plantar aspects of his feet Theragen require quite a bit of work to get these to heal. He has been using Santyl currently though that  is been problematic both in receiving the medication as well as actually paid for it as it is become quite expensive. Prior to the experience with Covid the patient really did not have any major medical problems other than hypertension he does have some mild generalized weakness following the Covid experience. 07/22/2020 on evaluation today patient appears to be doing okay in regard to his foot ulcers I feel like the wound beds are showing signs of better improvement that I do believe the Iodoflex is helping in this regard. With that being said he does have a lot of drainage currently and this is somewhat blue/green in nature which is consistent with Pseudomonas. I do think a culture today would be appropriate for [place] to evaluate and see if that is indeed the case I would likely start him on antibiotic orally as well he is not allergic to Cipro knows of no issues he has had in the past 12/21; patient was admitted to the clinic earlier this month with bilateral presumed pressure ulcers on the bottom of his feet apparently related to excessive pressure from a tilt table arrangement in the intensive care unit. Patient relates this to being on ECMO but I am not really sure that is exactly related to that. I must say I have never seen anything like this. He has fairly extensive full-thickness wounds extending from his heel towards his midfoot mostly centered laterally. There is already been some healing distally. He does not appear to have an arterial issue. He has been using gentamicin to the wound surfaces with Iodoflex to help with ongoing debridement 1/6; this is a patient with pressure ulcers on the bottom of his feet related to excessive pressure from a standing position in the intensive care unit. He is complaining of a lot of pain in the right heel. He is not a diabetic. 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 We have been using Iodoflex to help prepare the surfaces of both  wounds for an advanced treatment product. He is nonambulatory spending most of his time in a wheelchair I have asked him not to propel the wheelchair with his heels 1/13; in general his wounds look better not much surface area change we have been using Iodoflex as of last week. I did an x-ray of the right heel as the patient was complaining of pain. I had some thoughts about a stress fracture perhaps Achilles tendon problems however what it showed was erosive changes along the inferior aspect of the calcaneus he now has a MRI booked for 1/20. 1/20; in general his wounds continue to be better. Some improvement in the large narrow areas proximally in his foot. He is still complaining of pain in the right heel and tenderness in certain areas of this wound. His MRI is tonight. I am not just looking for osteomyelitis that was brought up on the x-ray I am wondering about stress fractures, tendon ruptures etc. He has no such findings on the left. Also noteworthy is that the patient had critical illness neuropathy and some of the discomfort may be actual improvement in nerve function I am just not sure. These wounds were initially in the setting of severe critical illness related to COVID-19. He was put in a standing position. He may have also been on pressors at the point contributing to tissue ischemia. By his description at some point these wounds were grossly necrotic extending proximally up into the Achilles part of his heel. I do not know that I have ever really seen pictures of them like this although they may exist in epic We have ordered Tri layer Oasis. I am trying to stimulate some granulation in these areas. This is of course assuming the MRI is negative for infection 1/27; since the patient was last here he saw Dr. Juleen China of infectious disease. He is planned for vancomycin and ceftriaxone. Prior operative culture grew MSSA. Also ordered baseline lab work. He also ordered arterial studies  although the ABIs in our clinic were normal as well as his clinical exam these were normal I do not think he needs to see vascular surgery. His ABIs at the [facility] were 1.22 in the right triphasic waveforms with a normal TBI of 1.15 on the left ABI of 1.22 with triphasic waveforms and a normal TBI of 1.08. Finally he saw Dr. Amalia Hailey who will follow him in for 2 months. At this point I do not think he felt that he needed a procedure on the right calcaneal bone. Dr. Juleen China is elected for broad-spectrum antibiotic The patient is still having pain in the right heel. He walks with a walker 2/3; wounds are generally smaller. He is tolerating his IV antibiotics. I believe this is vancomycin and ceftriaxone. We are still waiting for Oasis burn in terms of his out-of-pocket max which he should be meeting soon given the IV antibiotics, MRIs etc. I have asked him to check in on this. We are using silver collagen in the meantime the wounds look better 2/10; tolerating IV vancomycin and Rocephin. We are waiting to apply for Oasis. Although I am not really sure where he is in his out-of-pocket max. 2/17 started the first application of Oasis trilayer. Still on antibiotics. The wounds have generally look better. The area on the left has a little more surface slough requiring debridement [id number]; second application of Oasis trilayer. The  wound surface granulation is generally look better. The area on the left with undermining laterally I think is come in a bit. 10/08/2020 upon evaluation today patient is here today for [employer] application #3. Fortunately he seems to be doing extremely well with regard to this and we are seeing a lot of new epithelial growth which is great news. Fortunately there is no signs of active infection at this time. 10/16/2020 upon evaluation today patient appears to be doing well with regard to his foot ulcers. Do believe the Oasis has been of benefit for him. I do not see any signs of  infection right now which is great news and I think that he has a lot of new epithelial growth which is great to see as well. The patient is very pleased to hear all of this. I do think we can proceed with the Oasis trilayer #4 today. 3/18; not as much improvement in these areas on his heels that I was hoping. I did reapply trilateral Oasis today the tissue looks healthier but not as much fill in as I was hoping. 3/25; better looking today I think this is come in a bit the tissue looks healthier. Triple layer Oasis reapplied #6 4/1; somewhat better looking definitely better looking surface not as much change in surface area as I was hoping. He may be spending more time [place] on days then he needs to although he does have heel offloading boots. Triple layer Oasis reapplied #7 4/7; unfortunately apparently [facility] will not approve any further Oasis which is unfortunate since the patient did respond nicely both in terms of the condition of the wound bed as well as surface area. There is still some drainage coming from the wound but not a lot there does not appear to be any infection 4/15; we have been using Hydrofera Blue. He continues to have nice rims of epithelialization on the right greater than the left. The left the epithelialization is coming from the tip of his heel. There is moderate drainage. In this that concerns me about a total contact cast. There is no evidence of infection 4/29; patient has been using Hydrofera Blue with dressing changes. He has no complaints or issues today. 5/5; using Hydrofera Blue. I actually think that he looks marginally better than the last time I saw this 3 weeks ago. There are rims of epithelialization on the left thumb coming from the medial side on the right. Using Hydrofera Blue 5/12; using Hydrofera Blue. These continue to make improvements in surface area. His drainage was not listed as severe I therefore went ahead and put a cast on the left  foot. Right foot we will continue to dress his previous 5/16; back for first total contact cast change. He did not tolerate this particularly well cast injury on the anterior tibia among other issues. Difficulty sleeping. I talked him about this in some detail and afterwards is elected to continue. I told him I would like to have a cast on for 3 weeks to see if this is going to help at all. I think he agreed 5/19; I think the wound is better. There is no tunneling towards his midfoot. The undermining medially also looks better. He has a rim of new skin distally. I think we are making progress here. The area on the left also continues to look somewhat better to me using Hydrofera Blue. He has a list of complaints about the cast but none of them seem serious  5/26; patient presents for 1 week follow-up. He has been using a total contact cast and tolerating this well. Hydrofera Blue is the main dressing used. He denies signs of infection. 6/2 Hydrofera Blue total contact cast on the left. These were large ulcers that formed in intensive care unit where the patient was recovering from [place]. May have had something to do with being ventilated in an upright positiono Pressors etc. We have been able to get the areas down considerably and a viable surface. There is some epithelialization in both sides. Note made of drainage 6/9; changed to [facility] last time because of drainage. He arrives with better looking surfaces and dimensions on the left than the right. Paradoxically the right actually probes more towards his midfoot the left is largely close down but both of these look improved. Using a total contact cast on the left 6/16; complex wounds on his bilateral plantar heels which were initially pressure injury from a stay in the ICU with COVID. We have been using silver alginate most recently. His dimensions of come in quite dramatically however not recently. We have been putting the left foot in a  total contact cast 6/23; complex wounds on the bilateral plantar heels. I been putting the left in the cast paradoxically the area on the right is the one that is going towards closure at a faster rate. Quite a bit of drainage on the left. The patient went to see Dr. Amalia Hailey who said he was going to standby for skin grafts. I had actually considered sending him for skin grafts however he would be mandatorily off his feet for a period of weeks to months. I am thinking that the area on the right is going to close on its own the area on the left has been more stubborn even though we have him in a total contact cast 6/30; took him out of a total contact cast last week is the right heel seem to be making better progress than the left where I was placing the cast. We are using silver alginate. Both wounds are smaller right greater than left 7/12; both wounds look as though they are making some progress. We are using silver alginate. Heel offloading boots 7/26; very gradual progress especially on the right. Using silver alginate. He is wearing heel offloading boots 8/18; he continues to close these wounds down very gradually. Using silver alginate. The problem polymen being definitive about this is areas of what appears to be callus around the margins. This is not a 100% of the area but certainly sizable especially on the right 9/1; bilateral plantar feet wounds secondary to prolonged pressure while being ventilated for COVID-19 in an upright position. Essentially pressure ulcers on the bottom of his feet. He is made substantial progress using silver alginate. 9/14; bilateral plantar feet wounds secondary to prolonged pressure. Making progress using silver alginate. 9/29 bilateral plantar feet wounds secondary to prolonged pressure. I changed him to Iodoflex last week. MolecuLight showing reddened blush fluorescence 10/11; patient presents for follow-up. He has no issues or complaints today. He denies signs  of infection. He continues to use Iodoflex and antibiotic ointment to the wound beds. 10/27; 2-week follow-up. No evidence of infection. He has callus and thick dry skin around the wound margins we have been using Iodoflex and Bactroban which was in response to a moderate left MolecuLight reddish blush fluorescence. 11/10; 2-week follow-up. Wound margins again have thick callus however the measurements of the actual wound sites are  a lot smaller. Everything looks reasonably healthy here. We have been using Iodoflex He was approved for prime matrix but I have elected to delay this given the improvement in the surface area. Hopefully I will not regret that decision as were getting close to the end of the year in terms of insurance payment 12/8; 2-week follow-up. Wounds are generally smaller in size. These were initially substantial wounds extending into the forefoot all the way into the heel on the bilateral plantar feet. They are now both located on the plantar heel distal aspect both of these have a lot of callus around the wounds I used a #5 curette to remove this on the right and the left also some subcutaneous debris to try and get the wound edges were using Iodoflex. He has heel offloading shoe 12/22; 2-week follow-up. Not really much improvement. He has thick callus around the outer edges of both wounds. I remove this there is some nonviable subcutaneous tissue as well. We have been using Iodoflex. Her intake nurse and myself spontaneously thought of a total contact cast I went back in May. At that time we really were not seeing much of an improvement with a cast although the wound was in a much different situation I would like to retry this in 2 weeks and I discussed this with the patient 08/12/2021; the patient has had some improvement with the Iodoflex. The the area on the left heel plantar more improved than the right. I had to put him in a total contact cast on the left although I decided to  put that off for 2 weeks. I am going to change his primary dressing to silver collagen. I think in both areas he has had some improvement most of the healing seems to be more proximal in the heel. The wounds are in the mid aspect. A lot of thick callus on the right heel however. 1/19; we are using silver collagen on both plantar heel areas. He has had some improvement today. The left did not require any debridement. He still had some eschar on the right that was debrided but both seem to have contracted. I did not put it total contact cast on him today 2/2 we have been using silver collagen. The area on the right plantar heel has areas that appear to be epithelialized interspersed with dry flaking callus and dry skin. I removed this. This really looks better than on the other side. On the left still a large area with raised edges and debris on the surface. 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 He has no issues or complaints today. 2/9; not much change in the left foot wound with 1 week of a cast we are using silver collagen. Silver collagen on the right side. The right side has been the better wound surface. We will reapply the total contact cast on the left 2/16; not much improvement on either side I been using silver collagen with a total contact cast on the left. I'm changing the Hydrofera Blue still with a total contact cast on the left 2/23; some improvement on both sides. Disappointing that he has thick callus around the area that we are putting in a total contact cast on the left. We've been using Hydrofera Blue on both wound areas. This is a man who at essentially pressure ulcers in addition to  ischemia caused by medications to support his blood pressure (pressors) in the ICU. He was being ventilated in the standing position for severe Covid. A Shiley the  wounds extended across his entire foot but are now localized to his plantar heels bilaterally. We have made progress however neither areas healed. I continue to think the total contact cast is helped albeit painstakingly slowly. He has never wanted a plastic surgery consult although I don't know that they would be interested in grafting in area in this location. 10/07/2021: Continued improvement bilaterally. There is still some callus around the left wound, despite the total contact cast. He has some increased pain in his right midfoot around 1 particular area. This has been painful in the past but seems to be a little bit worse. When his cast was removed today, there was an area on the heel of the left foot that looks a bit macerated. He is also complaining of pain in his left thigh and hip which he thinks is secondary to the limb length discrepancy caused by the cast. 10/14/2021: He continues to improve. A little bit less callus around the left wound. He continues to endorse pain in his right midfoot, but this is not as significant as it was last week. The maceration on his left heel is improved. 10/21/2021: Continued improvement to both wounds. The maceration on his left heel is no longer evident. Less callus bilaterally. Epithelialization progressing. 10/28/2021: Significant improvement this week. The right sided wound is nearly closed with just a small open area at the middle. No maceration seen on the left heel. Continued epithelialization on both sides. No concern for infection. 11/04/2021: T oday, the wounds were measured a little bit differently and come out as larger, but I actually think they are about the same to potentially even smaller, particularly on the left. He continues to accumulate some callus on the right. 11/11/2021: T oday, the patient is expressing some concern that the left wound, despite being in the total contact cast, is not progressing at the same rate as the 1 on the right. He is  interested in trying a week without the cast to see how the wound does. The wounds are roughly the same size as last week, with the right perhaps being a little bit smaller. He continues to build up callus on both sites. 11/18/2021: Last week, I permitted the patient to go without his total contact cast, just to see if the cast was really making any difference. Today, both wounds have deteriorated to some extent, suggesting that the cast is providing benefit, at least on the left. Both are larger and have accumulated callus, slough, and other debris. 11/26/2021: I debrided both wounds quite aggressively last week in an effort to stimulate the healing cascade. This appears to have been effective as the left sided wound is a full centimeter shorter in length. Although the right was measured slightly larger, on inspection, it looks as though an area of epithelialized tissue was included in the measurements. We have been using PolyMem Ag on the wound surfaces with a total contact cast to the left. 12/02/2021: It appears that the intake personnel are including epithelialized tissue in his wound measurements; the right wound is almost completely epithelialized; there is just a crater at the proximal midfoot with some open areas. On the left, he has built up some callus, but the overall wound surface looks good. There is some senescent skin around the wound margin. He has been in PolyMem Ag  bilaterally with a total contact cast on the left. 12/09/2021: The right wound is nearly closed; there is just a small open area at the mid calcaneus. On the left, the wound is smaller with minimal callus buildup. No significant drainage. 12/16/2021: The right calcaneal wound remains minimally open at the mid calcaneus; the rest has epithelialized. On the left, the wound is also a little bit smaller. There is some senescent tissue on the periphery. He is getting his first application of a trial skin substitute called Vendaje  today. 12/23/2021: The wound on his right calcaneus is nearly closed; there is just a small area at the most distal aspect of the calcaneus that is open. On the left, the area where we applied to the skin substitute has a healthier-looking bed of granulation tissue. The wound dimensions are not significantly different on this side but the wound surface is improved. 12/30/2021: The wound on the right calcaneus has not changed significantly aside from some accumulation of callus. On the left, the open area is smaller and continues to have an improved surface. He continues to accumulate callus around the wound. He is here for his third application of Vendaje. 01/06/2022: The right calcaneal wound is down to just a couple of millimeters. He continues to accumulate periwound callus. He unfortunately got his cast wet earlier in the week and his left foot is macerated, resulting in some superficial skin loss just distal to the open wound. The open wound itself, however, is much smaller and has a healthier appearing surface. He is here for his fourth application of Vendaje. 01/13/2022: The right calcaneal wound is about the same. Unfortunately, once again, his cast got wet and his foot is again macerated. This is caused the left calcaneal wound to enlarge. He is here for his fifth application of Vendaje. 01/20/2022: The right calcaneal wound is very small. There is some periwound callus accumulation. He purchased a new cast protector last week and this has been effective in avoiding the maceration that has been occurring on the left. The left calcaneal wound is narrower and has a healthy and viable-appearing surface. He is here for his 6 application of Vendaje. 01/27/2022: The right calcaneal wound is down to just a pinhole. There is some periwound slough and callus. On the left, the wound is narrower and shorter by about a centimeter. The surface is robust and viable-appearing. Unfortunately, the rep for the trial  skin substitute product did not provide any for [place] to use today. 02/04/2022: The right calcaneal wound remains unchanged. There is more accumulated callus. On the left, although the intake nurse measured it a little bit longer, it looks about the same to me. The surface has a layer of slough, but underneath this, there is good granulation tissue. 02/10/2022: The right calcaneus wound is nearly closed. There is still some callus that builds up around the site. The left side looks about the same in terms of dimensions, but the surface is more robust and vital-appearing. 02/16/2022: The area of the right calcaneus that was nearly closed last week has closed, but there is a small opening at the mid foot where it looks like some moisture got retained and caused some reopening. The left foot wound is narrower and shallower. Both sites have a fair amount of periwound callus and eschar. 02/24/2022: The small midfoot opening on the right calcaneus is a little bit smaller today. The left foot wound is narrower and shallower. He continues to accumulate periwound callus. No concern  for infection. 03/01/2022: The patient came to clinic early because he showered and got his cast wet. Fortunately, there is no significant maceration to his foot but the callus softened and it looks like the wound on his left calcaneus may be a little bit wider. The wound on his right calcaneus is just a narrow slit. Continued accumulation of periwound callus bilaterally. 03/08/2022: The wound on his right calcaneus is very nearly closed, just a small pinpoint opening under a bit of eschar; the left wound has come in quite a bit, as well. It is narrower and shorter than at our last visit. Still with accumulated callus and eschar bilaterally. 03/17/2022: The right calcaneal wound is healed. The left wound is smaller and the surface itself is very clean, but there is some blue-green staining on the periwound callus, concerning for Pseudomonas  aeruginosa. Electronic Signature(s) Signed: 03/17/2022 3:23:24 PM By: Fredirick Maudlin MD FACS Entered By: Fredirick Maudlin on 03/17/2022 15:23:23 -------------------------------------------------------------------------------- Physical Exam Details Patient Name: Date of Service: [name], [name] 03/17/2022 1:30 PM Medical Record Number: [id number] Patient Account Number: [id number] Date of Birth/Sex: Treating RN: Aug 25, 1975 (46 y.o. [name] Primary Care Provider: [name] Other Clinician: Referring Provider: Treating Provider/Extender: [name] in Treatment: 41 Constitutional Hypertensive, asymptomatic. . . . No acute distress.Marland Kitchen Respiratory Normal work of breathing on room air.. Notes 03/17/2022: The right calcaneal wound is healed. The left wound is smaller and the surface itself is very clean, but there is some blue-green staining on the periwound callus, concerning for Pseudomonas aeruginosa. Electronic Signature(s) Signed: 03/17/2022 3:24:07 PM By: Fredirick Maudlin MD FACS Entered By: Fredirick Maudlin on 03/17/2022 15:24:07 -------------------------------------------------------------------------------- Physician Orders Details Patient Name: Date of Service: [name], [name]. 03/17/2022 1:30 PM Medical Record Number: [id number] Patient Account Number: [id number] Date of Birth/Sex: Treating RN: 1976-08-07 (46 y.o. [name] Primary Care Provider: [name] Other Clinician: Referring Provider: Treating Provider/Extender: [name] in Treatment: 48 Verbal / Phone Orders: No Diagnosis Coding ICD-10 Coding Code Description L97.512 Non-pressure chronic ulcer of other part of right foot with fat layer exposed L97.522 Non-pressure chronic ulcer of other part of left foot with fat layer exposed Follow-up Appointments ppointment in 1 week. - Dr [name] - Room 3 Wed August 8/16 at 9:30am Return  A Anesthetic Wound #2 Left Calcaneus (In clinic) Topical Lidocaine 5% applied to wound bed Cellular or Tissue Based Products Wound #2 Left Calcaneus Cellular or Tissue Based Product Type: - Vendaje on hold this week possibly resume at next visit Cellular or Tissue Based Product applied to wound bed, secured with steri-strips, cover with A daptic or Mepitel. (DO NOT REMOVE). Bathing/ Shower/ Hygiene May shower with protection but do not get wound dressing(s) wet. - Cover with cast protector (can purchase cast protector at [facility] or [facility] ) Edema Control - Lymphedema / SCD / Other Bilateral Lower Extremities Elevate legs to the level of the heart or above for 30 minutes daily and/or when sitting, a frequency of: - throughout the day Avoid standing for long periods of time. Moisturize legs daily. - right leg and foot every night. Off-Loading Total Contact Cast to Left Lower Extremity Wedge shoe to: - Globoped Shoe to right foot Other: - keep pressure off of the bottom of your feet Additional Orders / Instructions Follow Nutritious Diet Wound Treatment Wound #2 - Calcaneus Wound Laterality: Left Cleanser: Normal Saline (Generic) 1 x Per Week/30 Days Discharge Instructions: Cleanse the  wound with Normal Saline prior to applying a clean dressing using gauze sponges, not tissue or cotton balls. Cleanser: Wound Cleanser 1 x Per Week/30 Days Discharge Instructions: Cleanse the wound with wound cleanser prior to applying a clean dressing using gauze sponges, not tissue or cotton balls. Peri-Wound Care: Zinc Oxide Ointment 30g tube 1 x Per Week/30 Days Discharge Instructions: Apply Zinc Oxide to periwound with each dressing change Topical: Gentamicin 1 x Per Week/30 Days Discharge Instructions: As directed by physician Prim Dressing: KerraCel Ag Gelling Fiber Dressing, 4x5 in (silver alginate) 1 x Per Week/30 Days ary Discharge Instructions: Apply silver alginate to wound bed as  instructed Secondary Dressing: Optifoam Non-Adhesive Dressing, 4x4 in (Generic) 1 x Per Week/30 Days Discharge Instructions: Apply over primary dressing as directed. Secondary Dressing: Woven Gauze Sponge, Non-Sterile 4x4 in 1 x Per Week/30 Days Discharge Instructions: Apply over primary dressing as directed. Secured With: 69M Medipore H Soft Cloth Surgical T ape, 4 x 10 (in/yd) (Generic) 1 x Per Week/30 Days Discharge Instructions: Secure with tape as directed. Electronic Signature(s) Signed: 03/17/2022 4:41:51 PM By: Redmond Pulling RN, BSN Signed: 03/17/2022 4:47:38 PM By: [name] MD FACS Entered By: Redmond Pulling on 03/17/2022 16:32:42 -------------------------------------------------------------------------------- Problem List Details Patient Name: Date of Service: [name], TO [name] 03/17/2022 1:30 PM Medical Record Number: [id number] Patient Account Number: [id number] Date of Birth/Sex: Treating RN: 02-Aug-1976 (46 y.o. [name] Primary Care Provider: Dorinda Hill Other Clinician: Referring Provider: Treating Provider/Extender: Jana Hakim in Treatment: 59 Active Problems ICD-10 Encounter Code Description Active Date MDM Diagnosis L97.512 Non-pressure chronic ulcer of other part of right foot with fat layer exposed 09/03/2020 No Yes L97.522 Non-pressure chronic ulcer of other part of left foot with fat layer exposed 09/03/2020 No Yes Inactive Problems ICD-10 Code Description Active Date Inactive Date L89.893 Pressure ulcer of other site, stage 3 07/15/2020 07/15/2020 M62.81 Muscle weakness (generalized) 07/15/2020 07/15/2020 I10 Essential (primary) hypertension 07/15/2020 07/15/2020 M86.171 Other acute osteomyelitis, right ankle and foot 09/03/2020 09/03/2020 Resolved Problems Electronic Signature(s) Signed: 03/17/2022 3:22:34 PM By: [name] MD FACS Entered By: [name] on 03/17/2022  15:22:34 -------------------------------------------------------------------------------- Progress Note Details Patient Name: Date of Service: [name], [name] 03/17/2022 1:30 PM Medical Record Number: [id number] Patient Account Number: [id number] Date of Birth/Sex: Treating RN: April 21, 1976 (46 y.o. [name] Primary Care Provider: Dorinda Hill Other Clinician: Referring Provider: Treating Provider/Extender: Jana Hakim in Treatment: 18 Subjective Chief Complaint Information obtained from Patient Bilateral Plantar Foot Ulcers History of Present Illness (HPI) Wounds are12/03/2020 upon evaluation today patient presents for initial inspection here in our clinic concerning issues he has been having with the bottoms of his feet bilaterally. He states these actually occurred as wounds when he was hospitalized for 5 months secondary to Covid. He was apparently with tilting bed where he was in an upright position quite frequently and apparently this occurred in some way shape or form during that time. Fortunately there is no sign of active infection at this time. No fevers, chills, nausea, vomiting, or diarrhea. With that being said he still has substantial wounds on the plantar aspects of his feet Theragen require quite a bit of work to get these to heal. He has been using Santyl currently though that is been problematic both in receiving the medication as well as actually paid for it as it is become quite expensive. Prior to the experience with Covid the patient really did not have any  major medical problems other than hypertension he does have some mild generalized weakness following the Covid experience. 07/22/2020 on evaluation today patient appears to be doing okay in regard to his foot ulcers I feel like the wound beds are showing signs of better improvement that I do believe the Iodoflex is helping in this regard. With that being said he does have a lot of  drainage currently and this is somewhat blue/green in nature which is consistent with Pseudomonas. I do think a culture today would be appropriate for [place] to evaluate and see if that is indeed the case I would likely start him on antibiotic orally as well he is not allergic to Cipro knows of no issues he has had in the past 12/21; patient was admitted to the clinic earlier this month with bilateral presumed pressure ulcers on the bottom of his feet apparently related to excessive pressure from a tilt table arrangement in the intensive care unit. Patient relates this to being on ECMO but I am not really sure that is exactly related to that. I must say I have never seen anything like this. He has fairly extensive full-thickness wounds extending from his heel towards his midfoot mostly centered laterally. There is already been some healing distally. He does not appear to have an arterial issue. He has been using gentamicin to the wound surfaces with Iodoflex to help with ongoing debridement 1/6; this is a patient with pressure ulcers on the bottom of his feet related to excessive pressure from a standing position in the intensive care unit. He is complaining of a lot of pain in the right heel. He is not a diabetic. 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 We have been using Iodoflex to help prepare the surfaces of both wounds for an advanced treatment product. He is nonambulatory spending most of his time in a wheelchair I have asked him not to propel the wheelchair with his heels 1/13; in general his wounds look better not much surface area change we have been using Iodoflex as of last week. I did an x-ray of the right heel as the patient was complaining of pain. I had some thoughts about a stress fracture perhaps Achilles tendon problems however what it showed was erosive changes along the inferior aspect of the calcaneus he now has a MRI booked for 1/20. 1/20; in general his  wounds continue to be better. Some improvement in the large narrow areas proximally in his foot. He is still complaining of pain in the right heel and tenderness in certain areas of this wound. His MRI is tonight. I am not just looking for osteomyelitis that was brought up on the x-ray I am wondering about stress fractures, tendon ruptures etc. He has no such findings on the left. Also noteworthy is that the patient had critical illness neuropathy and some of the discomfort may be actual improvement in nerve function I am just not sure. These wounds were initially in the setting of severe critical illness related to COVID-19. He was put in a standing position. He may have also been on pressors at the point contributing to tissue ischemia. By his description at some point these wounds were grossly necrotic extending proximally up into the Achilles part of his heel. I do not know that I have ever really seen pictures of them like this although they may exist in epic We have ordered Tri layer Oasis. I am trying to stimulate some granulation in these areas.  This is of course assuming the MRI is negative for infection 1/27; since the patient was last here he saw Dr. Juleen China of infectious disease. He is planned for vancomycin and ceftriaxone. Prior operative culture grew MSSA. Also ordered baseline lab work. He also ordered arterial studies although the ABIs in our clinic were normal as well as his clinical exam these were normal I do not think he needs to see vascular surgery. His ABIs at the [facility] were 1.22 in the right triphasic waveforms with a normal TBI of 1.15 on the left ABI of 1.22 with triphasic waveforms and a normal TBI of 1.08. Finally he saw Dr. Amalia Hailey who will follow him in for 2 months. At this point I do not think he felt that he needed a procedure on the right calcaneal bone. Dr. Juleen China is elected for broad-spectrum antibiotic The patient is still having pain in the right heel. He walks with  a walker 2/3; wounds are generally smaller. He is tolerating his IV antibiotics. I believe this is vancomycin and ceftriaxone. We are still waiting for Oasis burn in terms of his out-of-pocket max which he should be meeting soon given the IV antibiotics, MRIs etc. I have asked him to check in on this. We are using silver collagen in the meantime the wounds look better 2/10; tolerating IV vancomycin and Rocephin. We are waiting to apply for Oasis. Although I am not really sure where he is in his out-of-pocket max. 2/17 started the first application of Oasis trilayer. Still on antibiotics. The wounds have generally look better. The area on the left has a little more surface slough requiring debridement [id number]; second application of Oasis trilayer. The wound surface granulation is generally look better. The area on the left with undermining laterally I think is come in a bit. 10/08/2020 upon evaluation today patient is here today for [employer] application #3. Fortunately he seems to be doing extremely well with regard to this and we are seeing a lot of new epithelial growth which is great news. Fortunately there is no signs of active infection at this time. 10/16/2020 upon evaluation today patient appears to be doing well with regard to his foot ulcers. Do believe the Oasis has been of benefit for him. I do not see any signs of infection right now which is great news and I think that he has a lot of new epithelial growth which is great to see as well. The patient is very pleased to hear all of this. I do think we can proceed with the Oasis trilayer #4 today. 3/18; not as much improvement in these areas on his heels that I was hoping. I did reapply trilateral Oasis today the tissue looks healthier but not as much fill in as I was hoping. 3/25; better looking today I think this is come in a bit the tissue looks healthier. Triple layer Oasis reapplied #6 4/1; somewhat better looking definitely better  looking surface not as much change in surface area as I was hoping. He may be spending more time [place] on days then he needs to although he does have heel offloading boots. Triple layer Oasis reapplied #7 4/7; unfortunately apparently [facility] will not approve any further Oasis which is unfortunate since the patient did respond nicely both in terms of the condition of the wound bed as well as surface area. There is still some drainage coming from the wound but not a lot there does not appear to be any  infection 4/15; we have been using Hydrofera Blue. He continues to have nice rims of epithelialization on the right greater than the left. The left the epithelialization is coming from the tip of his heel. There is moderate drainage. In this that concerns me about a total contact cast. There is no evidence of infection 4/29; patient has been using Hydrofera Blue with dressing changes. He has no complaints or issues today. 5/5; using Hydrofera Blue. I actually think that he looks marginally better than the last time I saw this 3 weeks ago. There are rims of epithelialization on the left thumb coming from the medial side on the right. Using Hydrofera Blue 5/12; using Hydrofera Blue. These continue to make improvements in surface area. His drainage was not listed as severe I therefore went ahead and put a cast on the left foot. Right foot we will continue to dress his previous 5/16; back for first total contact cast change. He did not tolerate this particularly well cast injury on the anterior tibia among other issues. Difficulty sleeping. I talked him about this in some detail and afterwards is elected to continue. I told him I would like to have a cast on for 3 weeks to see if this is going to help at all. I think he agreed 5/19; I think the wound is better. There is no tunneling towards his midfoot. The undermining medially also looks better. He has a rim of new skin distally. I think we  are making progress here. The area on the left also continues to look somewhat better to me using Hydrofera Blue. He has a list of complaints about the cast but none of them seem serious 5/26; patient presents for 1 week follow-up. He has been using a total contact cast and tolerating this well. Hydrofera Blue is the main dressing used. He denies signs of infection. 6/2 Hydrofera Blue total contact cast on the left. These were large ulcers that formed in intensive care unit where the patient was recovering from [place]. May have had something to do with being ventilated in an upright positiono Pressors etc. We have been able to get the areas down considerably and a viable surface. There is some epithelialization in both sides. Note made of drainage 6/9; changed to [facility] last time because of drainage. He arrives with better looking surfaces and dimensions on the left than the right. Paradoxically the right actually probes more towards his midfoot the left is largely close down but both of these look improved. Using a total contact cast on the left 6/16; complex wounds on his bilateral plantar heels which were initially pressure injury from a stay in the ICU with COVID. We have been using silver alginate most recently. His dimensions of come in quite dramatically however not recently. We have been putting the left foot in a total contact cast 6/23; complex wounds on the bilateral plantar heels. I been putting the left in the cast paradoxically the area on the right is the one that is going towards closure at a faster rate. Quite a bit of drainage on the left. The patient went to see Dr. Amalia Hailey who said he was going to standby for skin grafts. I had actually considered sending him for skin grafts however he would be mandatorily off his feet for a period of weeks to months. I am thinking that the area on the right is going to close on its own the area on the left has been more stubborn even though  we have him in a total contact cast 6/30; took him out of a total contact cast last week is the right heel seem to be making better progress than the left where I was placing the cast. We are using silver alginate. Both wounds are smaller right greater than left 7/12; both wounds look as though they are making some progress. We are using silver alginate. Heel offloading boots 7/26; very gradual progress especially on the right. Using silver alginate. He is wearing heel offloading boots 8/18; he continues to close these wounds down very gradually. Using silver alginate. The problem polymen being definitive about this is areas of what appears to be callus around the margins. This is not a 100% of the area but certainly sizable especially on the right 9/1; bilateral plantar feet wounds secondary to prolonged pressure while being ventilated for COVID-19 in an upright position. Essentially pressure ulcers on the bottom of his feet. He is made substantial progress using silver alginate. 9/14; bilateral plantar feet wounds secondary to prolonged pressure. Making progress using silver alginate. 9/29 bilateral plantar feet wounds secondary to prolonged pressure. I changed him to Iodoflex last week. MolecuLight showing reddened blush fluorescence 10/11; patient presents for follow-up. He has no issues or complaints today. He denies signs of infection. He continues to use Iodoflex and antibiotic ointment to the wound beds. 10/27; 2-week follow-up. No evidence of infection. He has callus and thick dry skin around the wound margins we have been using Iodoflex and Bactroban which was in response to a moderate left MolecuLight reddish blush fluorescence. 11/10; 2-week follow-up. Wound margins again have thick callus however the measurements of the actual wound sites are a lot smaller. Everything looks reasonably healthy here. We have been using Iodoflex He was approved for prime matrix but I have elected to delay  this given the improvement in the surface area. Hopefully I will not regret that decision as were getting close to the end of the year in terms of insurance payment 12/8; 2-week follow-up. Wounds are generally smaller in size. These were initially substantial wounds extending into the forefoot all the way into the heel on the bilateral plantar feet. They are now both located on the plantar heel distal aspect both of these have a lot of callus around the wounds I used a #5 curette to remove this on the right and the left also some subcutaneous debris to try and get the wound edges were using Iodoflex. He has heel offloading shoe 12/22; 2-week follow-up. Not really much improvement. He has thick callus around the outer edges of both wounds. I remove this there is some nonviable subcutaneous tissue as well. We have been using Iodoflex. Her intake nurse and myself spontaneously thought of a total contact cast I went back in May. At that time we really were not seeing much of an improvement with a cast although the wound was in a much different situation I would like to retry this in 2 weeks and I discussed this with the patient 08/12/2021; the patient has had some improvement with the Iodoflex. The the area on the left heel plantar more improved than the right. I had to put him in a total contact cast on the left although I decided to put that off for 2 weeks. I am going to change his primary dressing to silver collagen. I think in both areas he has had some improvement most of the healing seems to be more proximal in the heel. The wounds are  in the mid aspect. A lot of thick callus on the right heel however. 1/19; we are using silver collagen on both plantar heel areas. He has had some improvement today. The left did not require any debridement. He still had some eschar on the right that was debrided but both seem to have contracted. I did not put it total contact cast on him today 2/2 we have been using  silver collagen. The area on the right plantar heel has areas that appear to be epithelialized interspersed with dry flaking callus and dry skin. I removed this. This really looks better than on the other side. On the left still a large area with raised edges and debris on the surface. 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 He has no issues or complaints today. 2/9; not much change in the left foot wound with 1 week of a cast we are using silver collagen. Silver collagen on the right side. The right side has been the better wound surface. We will reapply the total contact cast on the left 2/16; not much improvement on either side I been using silver collagen with a total contact cast on the left. I'm changing the Hydrofera Blue still with a total contact cast on the left 2/23; some improvement on both sides. Disappointing that he has thick callus around the area that we are putting in a total contact cast on the left. We've been using Hydrofera Blue on both wound areas. This is a man who at essentially pressure ulcers in addition to ischemia caused by medications to support his blood pressure (pressors) in the ICU. He was being ventilated in the standing position for severe Covid. A Shiley the wounds extended across his entire foot but are now localized to his plantar heels bilaterally. We have made progress however neither areas healed. I continue to think the total contact cast is helped albeit painstakingly slowly. He has never wanted a plastic surgery consult although I don't know that they would be interested in grafting in area in this location. 10/07/2021: Continued improvement bilaterally. There is still some callus around the left wound, despite the total contact cast. He has some increased pain in his right midfoot around 1 particular area. This has been  painful in the past but seems to be a little bit worse. When his cast was removed today, there was an area on the heel of the left foot that looks a bit macerated. He is also complaining of pain in his left thigh and hip which he thinks is secondary to the limb length discrepancy caused by the cast. 10/14/2021: He continues to improve. A little bit less callus around the left wound. He continues to endorse pain in his right midfoot, but this is not as significant as it was last week. The maceration on his left heel is improved. 10/21/2021: Continued improvement to both wounds. The maceration on his left heel is no longer evident. Less callus bilaterally. Epithelialization progressing. 10/28/2021: Significant improvement this week. The right sided wound is nearly closed with just a small open area at the middle. No maceration seen on the left heel. Continued epithelialization on both sides. No concern for infection. 11/04/2021: T oday, the wounds were measured a little bit differently and come out as larger, but I actually think they are about the same to potentially even smaller, particularly  on the left. He continues to accumulate some callus on the right. 11/11/2021: T oday, the patient is expressing some concern that the left wound, despite being in the total contact cast, is not progressing at the same rate as the 1 on the right. He is interested in trying a week without the cast to see how the wound does. The wounds are roughly the same size as last week, with the right perhaps being a little bit smaller. He continues to build up callus on both sites. 11/18/2021: Last week, I permitted the patient to go without his total contact cast, just to see if the cast was really making any difference. Today, both wounds have deteriorated to some extent, suggesting that the cast is providing benefit, at least on the left. Both are larger and have accumulated callus, slough, and other debris. 11/26/2021: I debrided  both wounds quite aggressively last week in an effort to stimulate the healing cascade. This appears to have been effective as the left sided wound is a full centimeter shorter in length. Although the right was measured slightly larger, on inspection, it looks as though an area of epithelialized tissue was included in the measurements. We have been using PolyMem Ag on the wound surfaces with a total contact cast to the left. 12/02/2021: It appears that the intake personnel are including epithelialized tissue in his wound measurements; the right wound is almost completely epithelialized; there is just a crater at the proximal midfoot with some open areas. On the left, he has built up some callus, but the overall wound surface looks good. There is some senescent skin around the wound margin. He has been in PolyMem Ag bilaterally with a total contact cast on the left. 12/09/2021: The right wound is nearly closed; there is just a small open area at the mid calcaneus. On the left, the wound is smaller with minimal callus buildup. No significant drainage. 12/16/2021: The right calcaneal wound remains minimally open at the mid calcaneus; the rest has epithelialized. On the left, the wound is also a little bit smaller. There is some senescent tissue on the periphery. He is getting his first application of a trial skin substitute called Vendaje today. 12/23/2021: The wound on his right calcaneus is nearly closed; there is just a small area at the most distal aspect of the calcaneus that is open. On the left, the area where we applied to the skin substitute has a healthier-looking bed of granulation tissue. The wound dimensions are not significantly different on this side but the wound surface is improved. 12/30/2021: The wound on the right calcaneus has not changed significantly aside from some accumulation of callus. On the left, the open area is smaller and continues to have an improved surface. He continues to  accumulate callus around the wound. He is here for his third application of Vendaje. 01/06/2022: The right calcaneal wound is down to just a couple of millimeters. He continues to accumulate periwound callus. He unfortunately got his cast wet earlier in the week and his left foot is macerated, resulting in some superficial skin loss just distal to the open wound. The open wound itself, however, is much smaller and has a healthier appearing surface. He is here for his fourth application of Vendaje. 01/13/2022: The right calcaneal wound is about the same. Unfortunately, once again, his cast got wet and his foot is again macerated. This is caused the left calcaneal wound to enlarge. He is here for his fifth application of Vendaje.  01/20/2022: The right calcaneal wound is very small. There is some periwound callus accumulation. He purchased a new cast protector last week and this has been effective in avoiding the maceration that has been occurring on the left. The left calcaneal wound is narrower and has a healthy and viable-appearing surface. He is here for his 6 application of Vendaje. 01/27/2022: The right calcaneal wound is down to just a pinhole. There is some periwound slough and callus. On the left, the wound is narrower and shorter by about a centimeter. The surface is robust and viable-appearing. Unfortunately, the rep for the trial skin substitute product did not provide any for [place] to use today. 02/04/2022: The right calcaneal wound remains unchanged. There is more accumulated callus. On the left, although the intake nurse measured it a little bit longer, it looks about the same to me. The surface has a layer of slough, but underneath this, there is good granulation tissue. 02/10/2022: The right calcaneus wound is nearly closed. There is still some callus that builds up around the site. The left side looks about the same in terms of dimensions, but the surface is more robust and  vital-appearing. 02/16/2022: The area of the right calcaneus that was nearly closed last week has closed, but there is a small opening at the mid foot where it looks like some moisture got retained and caused some reopening. The left foot wound is narrower and shallower. Both sites have a fair amount of periwound callus and eschar. 02/24/2022: The small midfoot opening on the right calcaneus is a little bit smaller today. The left foot wound is narrower and shallower. He continues to accumulate periwound callus. No concern for infection. 03/01/2022: The patient came to clinic early because he showered and got his cast wet. Fortunately, there is no significant maceration to his foot but the callus softened and it looks like the wound on his left calcaneus may be a little bit wider. The wound on his right calcaneus is just a narrow slit. Continued accumulation of periwound callus bilaterally. 03/08/2022: The wound on his right calcaneus is very nearly closed, just a small pinpoint opening under a bit of eschar; the left wound has come in quite a bit, as well. It is narrower and shorter than at our last visit. Still with accumulated callus and eschar bilaterally. 03/17/2022: The right calcaneal wound is healed. The left wound is smaller and the surface itself is very clean, but there is some blue-green staining on the periwound callus, concerning for Pseudomonas aeruginosa. Patient History Information obtained from Patient. Family History Cancer - Maternal Grandparents, Diabetes - Father,Paternal Grandparents, Heart Disease - Maternal Grandparents, Hypertension - Father,Paternal Grandparents, Lung Disease - Siblings, No family history of Hereditary Spherocytosis, Kidney Disease, Seizures, Stroke, Thyroid Problems, Tuberculosis. Social History Never smoker, Marital Status - Married, Alcohol Use - Never, Drug Use - No History, Caffeine Use - Daily - tea, soda. Medical History Eyes Denies history of  Cataracts, Glaucoma, Optic Neuritis Ear/Nose/Mouth/Throat Denies history of Chronic sinus problems/congestion, Middle ear problems Hematologic/Lymphatic Denies history of Anemia, Hemophilia, Human Immunodeficiency Virus, Lymphedema, Sickle Cell Disease Respiratory Patient has history of Asthma Denies history of Aspiration, Chronic Obstructive Pulmonary Disease (COPD), Pneumothorax, Sleep Apnea, Tuberculosis Cardiovascular Patient has history of Angina - with COVID, Hypertension Denies history of Arrhythmia, Congestive Heart Failure, Coronary Artery Disease, Deep Vein Thrombosis, Hypotension, Myocardial Infarction, Peripheral Arterial Disease, Peripheral Venous Disease, Phlebitis, Vasculitis Gastrointestinal Denies history of Cirrhosis , Colitis, Crohnoos, Hepatitis A, Hepatitis B, Hepatitis C  Endocrine Denies history of Type I Diabetes, Type II Diabetes Genitourinary Denies history of End Stage Renal Disease Immunological Denies history of Lupus Erythematosus, Raynaudoos, Scleroderma Integumentary (Skin) Denies history of History of Burn Musculoskeletal Denies history of Gout, Rheumatoid Arthritis, Osteoarthritis, Osteomyelitis Neurologic Denies history of Dementia, Neuropathy, Quadriplegia, Paraplegia, Seizure Disorder Oncologic Denies history of Received Chemotherapy, Received Radiation Psychiatric Denies history of Anorexia/bulimia, Confinement Anxiety Hospitalization/Surgery History - COVID PNA 07/22/2019- 11/14/2019. - 03/27/2020 wound debridement/ skin graft. Medical A Surgical History Notes nd Constitutional Symptoms (General Health) COVID PNA 07/22/2019-11/14/2019 [facility] ECMO, foot drop left foot , Genitourinary kidney stone Psychiatric anxiety Objective Constitutional Hypertensive, asymptomatic. No acute distress.. Vitals Time Taken: 1:58 PM, Height: 69 in, Weight: 280 lbs, BMI: 41.3, Temperature: 98.3 F, Pulse: 84 bpm, Respiratory Rate: 18 breaths/min, Blood  Pressure: 151/81 mmHg. Respiratory Normal work of breathing on room air.. General Notes: 03/17/2022: The right calcaneal wound is healed. The left wound is smaller and the surface itself is very clean, but there is some blue-green staining on the periwound callus, concerning for Pseudomonas aeruginosa. Integumentary (Hair, Skin) Wound #1 status is Healed - Epithelialized. Original cause of wound was Pressure Injury. The date acquired was: 10/07/2019. The wound has been in treatment 87 weeks. The wound is located on the Right Calcaneus. The wound measures 0cm length x 0cm width x 0cm depth; 0cm^2 area and 0cm^3 volume. There is no tunneling or undermining noted. There is a none present amount of drainage noted. The wound margin is thickened. There is no granulation within the wound bed. There is no necrotic tissue within the wound bed. Wound #2 status is Open. Original cause of wound was Pressure Injury. The date acquired was: 10/07/2019. The wound has been in treatment 87 weeks. The wound is located on the Left Calcaneus. The wound measures 1.5cm length x 0.5cm width x 0.2cm depth; 0.589cm^2 area and 0.118cm^3 volume. There is Fat Layer (Subcutaneous Tissue) exposed. There is no tunneling or undermining noted. There is a medium amount of serosanguineous drainage noted. The wound margin is distinct with the outline attached to the wound base. There is medium (34-66%) red granulation within the wound bed. There is a medium (34-66%) amount of necrotic tissue within the wound bed including Eschar and Adherent Slough. 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 There was a T Contact Cast Procedure by Fredirick Maudlin, MD. otal Post procedure Diagnosis  Wound #2: Same as Pre-Procedure Plan Follow-up Appointments: Return Appointment in 1 week. - Dr [name] - Room 3 Wed August 8/16 at 9:30am Cellular or Tissue Based Products: Wound #2 Left Calcaneus: Cellular or Tissue Based Product Type: - Vendaje on hold this week possibly resume at next visit Cellular or Tissue Based Product applied to wound bed, secured with steri-strips, cover with Adaptic or Mepitel. (DO NOT REMOVE). Bathing/ Shower/ Hygiene: May shower with protection but do not get wound dressing(s) wet. - Cover with cast protector (can purchase cast protector at [facility] or [facility] ) Edema Control - Lymphedema / SCD / Other: Elevate legs to the level of the heart or above for 30 minutes daily and/or when sitting, a frequency of: - throughout the day Avoid standing for long periods of time. Moisturize legs daily. - right leg and foot every night.  Off-Loading: T Contact Cast to Left Lower Extremity otal Wedge shoe to: - Globoped Shoe to right foot Other: - keep pressure off of the bottom of your feet Additional Orders / Instructions: Follow Nutritious Diet WOUND #2: - Calcaneus Wound Laterality: Left Cleanser: Normal Saline (Generic) 1 x Per Week/30 Days Discharge Instructions: Cleanse the wound with Normal Saline prior to applying a clean dressing using gauze sponges, not tissue or cotton balls. Cleanser: Wound Cleanser 1 x Per Week/30 Days Discharge Instructions: Cleanse the wound with wound cleanser prior to applying a clean dressing using gauze sponges, not tissue or cotton balls. Peri-Wound Care: Zinc Oxide Ointment 30g tube 1 x Per Week/30 Days Discharge Instructions: Apply Zinc Oxide to periwound with each dressing change Topical: Gentamicin 1 x Per Week/30 Days Discharge Instructions: As directed by physician Prim Dressing: KerraCel Ag Gelling Fiber Dressing, 4x5 in (silver alginate) 1 x Per Week/30 Days ary Discharge Instructions: Apply silver alginate to wound bed as  instructed Secondary Dressing: Optifoam Non-Adhesive Dressing, 4x4 in (Generic) 1 x Per Week/30 Days Discharge Instructions: Apply over primary dressing as directed. Secondary Dressing: Woven Gauze Sponge, Non-Sterile 4x4 in 1 x Per Week/30 Days Discharge Instructions: Apply over primary dressing as directed. Secured With: 51M Medipore H Soft Cloth Surgical T ape, 4 x 10 (in/yd) (Generic) 1 x Per Week/30 Days Discharge Instructions: Secure with tape as directed. 03/17/2022: The right calcaneal wound is healed. The left wound is smaller and the surface itself is very clean, but there is some blue-green staining on the periwound callus, concerning for Pseudomonas aeruginosa. We will continue to pad the right calcaneal site as I think it is at high risk of tissue breakdown if we do not do so. I removed the callus with the blue-green staining. I am not going to apply the trial donated skin substitute today due to concern for infection. I do not think he requires systemic antibiotics so we will apply gentamicin with silver alginate and a total contact cast today. We will reevaluate for future skin substitute applications at his next visit. Follow-up in 1 week. Electronic Signature(s) Signed: 03/17/2022 3:25:27 PM By: Fredirick Maudlin MD FACS Entered By: Fredirick Maudlin on 03/17/2022 15:25:27 -------------------------------------------------------------------------------- HxROS Details Patient Name: Date of Service: [name], [place] 03/17/2022 1:30 PM Medical Record Number: [id number] Patient Account Number: [id number] Date of Birth/Sex: Treating RN: 01-27-1976 (46 y.o. [name] Primary Care Provider: [name] Other Clinician: Referring Provider: Treating Provider/Extender: [name] in Treatment: 61 Information Obtained From Patient Constitutional Symptoms (General Health) Medical History: Past Medical History Notes: COVID PNA 07/22/2019-11/14/2019 [facility]  ECMO, foot drop left foot , Eyes Medical History: Negative for: Cataracts; Glaucoma; Optic Neuritis Ear/Nose/Mouth/Throat Medical History: Negative for: Chronic sinus problems/congestion; Middle ear problems Hematologic/Lymphatic Medical History: Negative for: Anemia; Hemophilia; Human Immunodeficiency Virus; Lymphedema; Sickle Cell Disease Respiratory Medical History: Positive for: Asthma Negative for: Aspiration; Chronic Obstructive Pulmonary Disease (COPD); Pneumothorax; Sleep Apnea; Tuberculosis Cardiovascular Medical History: Positive for: Angina - with COVID; Hypertension Negative for: Arrhythmia; Congestive Heart Failure; Coronary Artery Disease; Deep Vein Thrombosis; Hypotension; Myocardial Infarction; Peripheral Arterial Disease; Peripheral Venous Disease; Phlebitis; Vasculitis Gastrointestinal Medical History: Negative for: Cirrhosis ; Colitis; Crohns; Hepatitis A; Hepatitis B; Hepatitis C Endocrine Medical History: Negative for: Type I Diabetes; Type II Diabetes Genitourinary Medical History: Negative for: End Stage Renal Disease Past Medical History Notes: kidney stone Immunological Medical History: Negative for: Lupus Erythematosus; Raynauds; Scleroderma Integumentary (Skin) Medical History: Negative for: History of Burn  Musculoskeletal Medical History: Negative for: Gout; Rheumatoid Arthritis; Osteoarthritis; Osteomyelitis Neurologic Medical History: Negative for: Dementia; Neuropathy; Quadriplegia; Paraplegia; Seizure Disorder Oncologic Medical History: Negative for: Received Chemotherapy; Received Radiation Psychiatric Medical History: Negative for: Anorexia/bulimia; Confinement Anxiety Past Medical History Notes: anxiety Immunizations Pneumococcal Vaccine: Received Pneumococcal Vaccination: No Implantable Devices None Hospitalization / Surgery History Type of Hospitalization/Surgery COVID PNA 07/22/2019- 11/14/2019 03/27/2020 wound  debridement/ skin graft Family and Social History Cancer: Yes - Maternal Grandparents; Diabetes: Yes - Father,Paternal Grandparents; Heart Disease: Yes - Maternal Grandparents; Hereditary Spherocytosis: No; Hypertension: Yes - Father,Paternal Grandparents; Kidney Disease: No; Lung Disease: Yes - Siblings; Seizures: No; Stroke: No; Thyroid Problems: No; Tuberculosis: No; Never smoker; Marital Status - Married; Alcohol Use: Never; Drug Use: No History; Caffeine Use: Daily - tea, soda; Financial Concerns: No; Food, Clothing or Shelter Needs: No; Support System Lacking: No; Transportation Concerns: No Electronic Signature(s) Signed: 03/17/2022 4:47:38 PM By: Fredirick Maudlin MD FACS Signed: 03/18/2022 4:07:48 PM By: Adline Peals Entered By: Fredirick Maudlin on 03/17/2022 15:23:30 -------------------------------------------------------------------------------- Total Contact Cast Details Patient Name: Date of Service: [name], [place] 03/17/2022 1:30 PM Medical Record Number: [id number] Patient Account Number: [id number] Date of Birth/Sex: Treating RN: 1975/12/08 (46 y.o. [name] Primary Care Provider: [name] Other Clinician: Referring Provider: Treating Provider/Extender: [name] in Treatment: 11 T Contact Cast Applied for Wound Assessment: otal Wound #2 Left Calcaneus Performed By: Physician Fredirick Maudlin, MD Post Procedure Diagnosis Same as Pre-procedure Electronic Signature(s) Signed: 03/17/2022 4:41:51 PM By: Sharyn Creamer RN, BSN Signed: 03/17/2022 4:47:38 PM By: Fredirick Maudlin MD FACS Entered By: Sharyn Creamer on 03/17/2022 14:39:44 -------------------------------------------------------------------------------- [place] Details Patient Name: Date of Service: [name], [place]. 03/17/2022 Medical Record Number: [id number] Patient Account Number: [id number] Date of Birth/Sex: Treating RN: 18-Apr-1976 (46 y.o. [name] Primary Care Provider: [name] Other Clinician: Referring Provider: Treating Provider/Extender: [name] in Treatment: 87 Diagnosis Coding ICD-10 Codes Code Description [phone] Non-pressure chronic ulcer of other part of right foot with fat layer exposed L97.522 Non-pressure chronic ulcer of other part of left foot with fat layer exposed Facility Procedures CPT4 Code: [id number] Description: [id number] - APPLY TOTAL CONTACT LEG CAST ICD-10 Diagnosis Description L97.522 Non-pressure chronic ulcer of other part of left foot with fat layer exposed Modifier: Quantity: 1 Physician Procedures : CPT4 Code Description Modifier [id number] - WC PHYS LEVEL 4 - EST PT 25 1 ICD-10 Diagnosis Description L97.512 Non-pressure chronic ulcer of other part of right foot with fat layer exposed L97.522 Non-pressure chronic ulcer of other part of left  foot with fat layer exposed Quantity: : [id number] - WC PHYS APPLY TOTAL CONTACT CAST 1 ICD-10 Diagnosis Description L97.522 Non-pressure chronic ulcer of other part of left foot with fat layer exposed Quantity: Electronic Signature(s) Signed: 03/17/2022 3:25:54 PM By: Fredirick Maudlin MD FACS Entered By: Fredirick Maudlin on 03/17/2022 15:25:54

## 2022-03-23 ENCOUNTER — Encounter (HOSPITAL_BASED_OUTPATIENT_CLINIC_OR_DEPARTMENT_OTHER): Payer: PPO | Admitting: General Surgery

## 2022-03-23 DIAGNOSIS — L97522 Non-pressure chronic ulcer of other part of left foot with fat layer exposed: Secondary | ICD-10-CM | POA: Diagnosis not present

## 2022-03-24 ENCOUNTER — Telehealth: Payer: Self-pay

## 2022-03-24 NOTE — Progress Notes (Addendum)
ARHUM, PEEPLES ([id number]) Visit Report for 03/23/2022 Chief Complaint Document Details Patient Name: Date of Service: [name], TO [name] 03/23/2022 9:30 A M Medical Record Number: [id number] Patient Account Number: [id number] Date of Birth/Sex: Treating RN: Sep 04, 1975 (46 y.o. M) Primary Care Provider: Dorinda Hill Other Clinician: Referring Provider: Treating Provider/Extender: Jana Hakim in Treatment: 55 Information Obtained from: Patient Chief Complaint Bilateral Plantar Foot Ulcers Electronic Signature(s) Signed: 03/23/2022 11:09:55 AM By: [name] MD FACS Entered By: [name] on 03/23/2022 11:09:55 -------------------------------------------------------------------------------- Cellular or Tissue Based Product Details Patient Name: Date of Service: [name], TO [name] 03/23/2022 9:30 A M Medical Record Number: [id number] Patient Account Number: [id number] Date of Birth/Sex: Treating RN: Jun 13, 1976 (46 y.o. [name] Primary Care Provider: Dorinda Hill Other Clinician: Referring Provider: Treating Provider/Extender: Jana Hakim in Treatment: 14 Cellular or Tissue Based Product Type Wound #2 Left Calcaneus Applied to: Performed By: Physician [name], MD Cellular or Tissue Based Product Type: Other Level of Consciousness (Pre-procedure): Awake and Alert Pre-procedure Verification/Time Out Yes - 10:22 Taken: Location: genitalia / hands / feet / multiple digits Wound Size (sq cm): 0.24 Product Size (sq cm): 8 Waste Size (sq cm): 0 Amount of Product Applied (sq cm): 8 Instrument Used: Forceps, Scissors Lot #: [id number] Order #: [id number] Expiration Date: 10/13/2024 Fenestrated: No Reconstituted: No Secured: Yes Secured With: Steri-Strips Dressing Applied: Yes Primary Dressing: Adaptic;gauze Procedural Pain: 0 Post Procedural Pain: 0 Response to Treatment: Procedure was tolerated well Level of  Consciousness (Post- Awake and Alert procedure): Post Procedure Diagnosis Same as Pre-procedure Notes Skin Sub **** Vendaje******Trial Electronic Signature(s) Signed: 03/23/2022 5:21:55 PM By: [name] RN Signed: 03/24/2022 7:42:26 AM By: [name] MD FACS Entered By: [name] on 03/23/2022 16:42:05 -------------------------------------------------------------------------------- Debridement Details Patient Name: Date of Service: [name], [name] 03/23/2022 9:30 A M Medical Record Number: [id number] Patient Account Number: [id number] Date of Birth/Sex: Treating RN: 05-29-1976 (46 y.o. [name] Primary Care Provider: Dorinda Hill Other Clinician: Referring Provider: Treating Provider/Extender: Jana Hakim in Treatment: 88 Debridement Performed for Assessment: Wound #2 Left Calcaneus Performed By: Physician [name], MD Debridement Type: Debridement Level of Consciousness (Pre-procedure): Awake and Alert Pre-procedure Verification/Time Out Yes - 10:22 Taken: Start Time: 10:22 Pain Control: Lidocaine 4% T opical Solution T Area Debrided (L x W): otal 1.2 (cm) x 0.2 (cm) = 0.24 (cm) Tissue and other material debrided: Non-Viable, Callus Level: Non-Viable Tissue Debridement Description: Selective/Open Wound Instrument: Curette Bleeding: Minimum Hemostasis Achieved: Pressure End Time: 10:23 Procedural Pain: 0 Post Procedural Pain: 0 Response to Treatment: Procedure was tolerated well Level of Consciousness (Post- Awake and Alert procedure): Post Debridement Measurements of Total Wound Length: (cm) 1.2 Stage: Category/Stage III Width: (cm) 0.2 Depth: (cm) 0.2 Volume: (cm) 0.038 Character of Wound/Ulcer Post Debridement: Improved Post Procedure Diagnosis Same as Pre-procedure Electronic Signature(s) Signed: 03/23/2022 11:18:41 AM By: [name] MD FACS Signed: 03/23/2022 5:21:55 PM By: [name]  RN Entered By: [name] on 03/23/2022 10:27:44 -------------------------------------------------------------------------------- HPI Details Patient Name: Date of Service: [name], [name] 03/23/2022 9:30 A M Medical Record Number: [id number] Patient Account Number: [id number] Date of Birth/Sex: Treating RN: Jul 15, 1976 (46 y.o. M) Primary Care Provider: Dorinda Hill Other Clinician: Referring Provider: Treating Provider/Extender: Jana Hakim in Treatment: 49 History of Present Illness HPI Description: Wounds are12/03/2020 upon evaluation today patient presents for initial inspection here in our clinic concerning issues he  has been having with the bottoms of his feet bilaterally. He states these actually occurred as wounds when he was hospitalized for 5 months secondary to Covid. He was apparently with tilting bed where he was in an upright position quite frequently and apparently this occurred in some way shape or form during that time. Fortunately there is no sign of active infection at this time. No fevers, chills, nausea, vomiting, or diarrhea. With that being said he still has substantial wounds on the plantar aspects of his feet Theragen require quite a bit of work to get these to heal. He has been using Santyl currently though that is been problematic both in receiving the medication as well as actually paid for it as it is become quite expensive. Prior to the experience with Covid the patient really did not have any major medical problems other than hypertension he does have some mild generalized weakness following the Covid experience. 07/22/2020 on evaluation today patient appears to be doing okay in regard to his foot ulcers I feel like the wound beds are showing signs of better improvement that I do believe the Iodoflex is helping in this regard. With that being said he does have a lot of drainage currently and this is somewhat blue/green in nature which  is consistent with Pseudomonas. I do think a culture today would be appropriate for [place] to evaluate and see if that is indeed the case I would likely start him on antibiotic orally as well he is not allergic to Cipro knows of no issues he has had in the past 12/21; patient was admitted to the clinic earlier this month with bilateral presumed pressure ulcers on the bottom of his feet apparently related to excessive pressure from a tilt table arrangement in the intensive care unit. Patient relates this to being on ECMO but I am not really sure that is exactly related to that. I must say I have never seen anything like this. He has fairly extensive full-thickness wounds extending from his heel towards his midfoot mostly centered laterally. There is already been some healing distally. He does not appear to have an arterial issue. He has been using gentamicin to the wound surfaces with Iodoflex to help with ongoing debridement 1/6; this is a patient with pressure ulcers on the bottom of his feet related to excessive pressure from a standing position in the intensive care unit. He is complaining of a lot of pain in the right heel. He is not a diabetic. 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 We have been using Iodoflex to help prepare the surfaces of both wounds for an advanced treatment product. He is nonambulatory spending most of his time in a wheelchair I have asked him not to propel the wheelchair with his heels 1/13; in general his wounds look better not much surface area change we have been using Iodoflex as of last week. I did an x-ray of the right heel as the patient was complaining of pain. I had some thoughts about a stress fracture perhaps Achilles tendon problems however what it showed was erosive changes along the inferior aspect of the calcaneus he now has a MRI booked for 1/20. 1/20; in general his wounds continue to be better. Some improvement in the large narrow  areas proximally in his foot. He is still complaining of pain in the right heel and tenderness in certain areas of this wound. His MRI is tonight. I am not just looking for osteomyelitis that  was brought up on the x-ray I am wondering about stress fractures, tendon ruptures etc. He has no such findings on the left. Also noteworthy is that the patient had critical illness neuropathy and some of the discomfort may be actual improvement in nerve function I am just not sure. These wounds were initially in the setting of severe critical illness related to COVID-19. He was put in a standing position. He may have also been on pressors at the point contributing to tissue ischemia. By his description at some point these wounds were grossly necrotic extending proximally up into the Achilles part of his heel. I do not know that I have ever really seen pictures of them like this although they may exist in epic We have ordered Tri layer Oasis. I am trying to stimulate some granulation in these areas. This is of course assuming the MRI is negative for infection 1/27; since the patient was last here he saw Dr. Juleen China of infectious disease. He is planned for vancomycin and ceftriaxone. Prior operative culture grew MSSA. Also ordered baseline lab work. He also ordered arterial studies although the ABIs in our clinic were normal as well as his clinical exam these were normal I do not think he needs to see vascular surgery. His ABIs at the [facility] were 1.22 in the right triphasic waveforms with a normal TBI of 1.15 on the left ABI of 1.22 with triphasic waveforms and a normal TBI of 1.08. Finally he saw Dr. Amalia Hailey who will follow him in for 2 months. At this point I do not think he felt that he needed a procedure on the right calcaneal bone. Dr. Juleen China is elected for broad-spectrum antibiotic The patient is still having pain in the right heel. He walks with a walker 2/3; wounds are generally smaller. He is tolerating his  IV antibiotics. I believe this is vancomycin and ceftriaxone. We are still waiting for Oasis burn in terms of his out-of-pocket max which he should be meeting soon given the IV antibiotics, MRIs etc. I have asked him to check in on this. We are using silver collagen in the meantime the wounds look better 2/10; tolerating IV vancomycin and Rocephin. We are waiting to apply for Oasis. Although I am not really sure where he is in his out-of-pocket max. 2/17 started the first application of Oasis trilayer. Still on antibiotics. The wounds have generally look better. The area on the left has a little more surface slough requiring debridement [id number]; second application of Oasis trilayer. The wound surface granulation is generally look better. The area on the left with undermining laterally I think is come in a bit. 10/08/2020 upon evaluation today patient is here today for [employer] application #3. Fortunately he seems to be doing extremely well with regard to this and we are seeing a lot of new epithelial growth which is great news. Fortunately there is no signs of active infection at this time. 10/16/2020 upon evaluation today patient appears to be doing well with regard to his foot ulcers. Do believe the Oasis has been of benefit for him. I do not see any signs of infection right now which is great news and I think that he has a lot of new epithelial growth which is great to see as well. The patient is very pleased to hear all of this. I do think we can proceed with the Oasis trilayer #4 today. 3/18; not as much improvement in these areas on his heels that I was hoping.  I did reapply trilateral Oasis today the tissue looks healthier but not as much fill in as I was hoping. 3/25; better looking today I think this is come in a bit the tissue looks healthier. Triple layer Oasis reapplied #6 4/1; somewhat better looking definitely better looking surface not as much change in surface area as I was hoping. He  may be spending more time [place] on days then he needs to although he does have heel offloading boots. Triple layer Oasis reapplied #7 4/7; unfortunately apparently [facility] will not approve any further Oasis which is unfortunate since the patient did respond nicely both in terms of the condition of the wound bed as well as surface area. There is still some drainage coming from the wound but not a lot there does not appear to be any infection 4/15; we have been using Hydrofera Blue. He continues to have nice rims of epithelialization on the right greater than the left. The left the epithelialization is coming from the tip of his heel. There is moderate drainage. In this that concerns me about a total contact cast. There is no evidence of infection 4/29; patient has been using Hydrofera Blue with dressing changes. He has no complaints or issues today. 5/5; using Hydrofera Blue. I actually think that he looks marginally better than the last time I saw this 3 weeks ago. There are rims of epithelialization on the left thumb coming from the medial side on the right. Using Hydrofera Blue 5/12; using Hydrofera Blue. These continue to make improvements in surface area. His drainage was not listed as severe I therefore went ahead and put a cast on the left foot. Right foot we will continue to dress his previous 5/16; back for first total contact cast change. He did not tolerate this particularly well cast injury on the anterior tibia among other issues. Difficulty sleeping. I talked him about this in some detail and afterwards is elected to continue. I told him I would like to have a cast on for 3 weeks to see if this is going to help at all. I think he agreed 5/19; I think the wound is better. There is no tunneling towards his midfoot. The undermining medially also looks better. He has a rim of new skin distally. I think we are making progress here. The area on the left also continues to look  somewhat better to me using Hydrofera Blue. He has a list of complaints about the cast but none of them seem serious 5/26; patient presents for 1 week follow-up. He has been using a total contact cast and tolerating this well. Hydrofera Blue is the main dressing used. He denies signs of infection. 6/2 Hydrofera Blue total contact cast on the left. These were large ulcers that formed in intensive care unit where the patient was recovering from [place]. May have had something to do with being ventilated in an upright positiono Pressors etc. We have been able to get the areas down considerably and a viable surface. There is some epithelialization in both sides. Note made of drainage 6/9; changed to [facility] last time because of drainage. He arrives with better looking surfaces and dimensions on the left than the right. Paradoxically the right actually probes more towards his midfoot the left is largely close down but both of these look improved. Using a total contact cast on the left 6/16; complex wounds on his bilateral plantar heels which were initially pressure injury from a stay in the  ICU with COVID. We have been using silver alginate most recently. His dimensions of come in quite dramatically however not recently. We have been putting the left foot in a total contact cast 6/23; complex wounds on the bilateral plantar heels. I been putting the left in the cast paradoxically the area on the right is the one that is going towards closure at a faster rate. Quite a bit of drainage on the left. The patient went to see Dr. Amalia Hailey who said he was going to standby for skin grafts. I had actually considered sending him for skin grafts however he would be mandatorily off his feet for a period of weeks to months. I am thinking that the area on the right is going to close on its own the area on the left has been more stubborn even though we have him in a total contact cast 6/30; took him out of a total  contact cast last week is the right heel seem to be making better progress than the left where I was placing the cast. We are using silver alginate. Both wounds are smaller right greater than left 7/12; both wounds look as though they are making some progress. We are using silver alginate. Heel offloading boots 7/26; very gradual progress especially on the right. Using silver alginate. He is wearing heel offloading boots 8/18; he continues to close these wounds down very gradually. Using silver alginate. The problem polymen being definitive about this is areas of what appears to be callus around the margins. This is not a 100% of the area but certainly sizable especially on the right 9/1; bilateral plantar feet wounds secondary to prolonged pressure while being ventilated for COVID-19 in an upright position. Essentially pressure ulcers on the bottom of his feet. He is made substantial progress using silver alginate. 9/14; bilateral plantar feet wounds secondary to prolonged pressure. Making progress using silver alginate. 9/29 bilateral plantar feet wounds secondary to prolonged pressure. I changed him to Iodoflex last week. MolecuLight showing reddened blush fluorescence 10/11; patient presents for follow-up. He has no issues or complaints today. He denies signs of infection. He continues to use Iodoflex and antibiotic ointment to the wound beds. 10/27; 2-week follow-up. No evidence of infection. He has callus and thick dry skin around the wound margins we have been using Iodoflex and Bactroban which was in response to a moderate left MolecuLight reddish blush fluorescence. 11/10; 2-week follow-up. Wound margins again have thick callus however the measurements of the actual wound sites are a lot smaller. Everything looks reasonably healthy here. We have been using Iodoflex He was approved for prime matrix but I have elected to delay this given the improvement in the surface area. Hopefully I will  not regret that decision as were getting close to the end of the year in terms of insurance payment 12/8; 2-week follow-up. Wounds are generally smaller in size. These were initially substantial wounds extending into the forefoot all the way into the heel on the bilateral plantar feet. They are now both located on the plantar heel distal aspect both of these have a lot of callus around the wounds I used a #5 curette to remove this on the right and the left also some subcutaneous debris to try and get the wound edges were using Iodoflex. He has heel offloading shoe 12/22; 2-week follow-up. Not really much improvement. He has thick callus around the outer edges of both wounds. I remove this there is some nonviable subcutaneous tissue as well.  We have been using Iodoflex. Her intake nurse and myself spontaneously thought of a total contact cast I went back in May. At that time we really were not seeing much of an improvement with a cast although the wound was in a much different situation I would like to retry this in 2 weeks and I discussed this with the patient 08/12/2021; the patient has had some improvement with the Iodoflex. The the area on the left heel plantar more improved than the right. I had to put him in a total contact cast on the left although I decided to put that off for 2 weeks. I am going to change his primary dressing to silver collagen. I think in both areas he has had some improvement most of the healing seems to be more proximal in the heel. The wounds are in the mid aspect. A lot of thick callus on the right heel however. 1/19; we are using silver collagen on both plantar heel areas. He has had some improvement today. The left did not require any debridement. He still had some eschar on the right that was debrided but both seem to have contracted. I did not put it total contact cast on him today 2/2 we have been using silver collagen. The area on the right plantar heel has areas that  appear to be epithelialized interspersed with dry flaking callus and dry skin. I removed this. This really looks better than on the other side. On the left still a large area with raised edges and debris on the surface. 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 He has no issues or complaints today. 2/9; not much change in the left foot wound with 1 week of a cast we are using silver collagen. Silver collagen on the right side. The right side has been the better wound surface. We will reapply the total contact cast on the left 2/16; not much improvement on either side I been using silver collagen with a total contact cast on the left. I'm changing the Hydrofera Blue still with a total contact cast on the left 2/23; some improvement on both sides. Disappointing that he has thick callus around the area that we are putting in a total contact cast on the left. We've been using Hydrofera Blue on both wound areas. This is a man who at essentially pressure ulcers in addition to ischemia caused by medications to support his blood pressure (pressors) in the ICU. He was being ventilated in the standing position for severe Covid. A Shiley the wounds extended across his entire foot but are now localized to his plantar heels bilaterally. We have made progress however neither areas healed. I continue to think the total contact cast is helped albeit painstakingly slowly. He has never wanted a plastic surgery consult although I don't know that they would be interested in grafting in area in this location. 10/07/2021: Continued improvement bilaterally. There is still some callus around the left wound, despite the total contact cast. He has some increased pain in his right midfoot around 1 particular area. This has been painful in the past but seems to be a little bit worse. When his cast  was removed today, there was an area on the heel of the left foot that looks a bit macerated. He is also complaining of pain in his left thigh and hip  which he thinks is secondary to the limb length discrepancy caused by the cast. 10/14/2021: He continues to improve. A little bit less callus around the left wound. He continues to endorse pain in his right midfoot, but this is not as significant as it was last week. The maceration on his left heel is improved. 10/21/2021: Continued improvement to both wounds. The maceration on his left heel is no longer evident. Less callus bilaterally. Epithelialization progressing. 10/28/2021: Significant improvement this week. The right sided wound is nearly closed with just a small open area at the middle. No maceration seen on the left heel. Continued epithelialization on both sides. No concern for infection. 11/04/2021: T oday, the wounds were measured a little bit differently and come out as larger, but I actually think they are about the same to potentially even smaller, particularly on the left. He continues to accumulate some callus on the right. 11/11/2021: T oday, the patient is expressing some concern that the left wound, despite being in the total contact cast, is not progressing at the same rate as the 1 on the right. He is interested in trying a week without the cast to see how the wound does. The wounds are roughly the same size as last week, with the right perhaps being a little bit smaller. He continues to build up callus on both sites. 11/18/2021: Last week, I permitted the patient to go without his total contact cast, just to see if the cast was really making any difference. Today, both wounds have deteriorated to some extent, suggesting that the cast is providing benefit, at least on the left. Both are larger and have accumulated callus, slough, and other debris. 11/26/2021: I debrided both wounds quite aggressively last week in an effort to stimulate the  healing cascade. This appears to have been effective as the left sided wound is a full centimeter shorter in length. Although the right was measured slightly larger, on inspection, it looks as though an area of epithelialized tissue was included in the measurements. We have been using PolyMem Ag on the wound surfaces with a total contact cast to the left. 12/02/2021: It appears that the intake personnel are including epithelialized tissue in his wound measurements; the right wound is almost completely epithelialized; there is just a crater at the proximal midfoot with some open areas. On the left, he has built up some callus, but the overall wound surface looks good. There is some senescent skin around the wound margin. He has been in PolyMem Ag bilaterally with a total contact cast on the left. 12/09/2021: The right wound is nearly closed; there is just a small open area at the mid calcaneus. On the left, the wound is smaller with minimal callus buildup. No significant drainage. 12/16/2021: The right calcaneal wound remains minimally open at the mid calcaneus; the rest has epithelialized. On the left, the wound is also a little bit smaller. There is some senescent tissue on the periphery. He is getting his first application of a trial skin substitute called Vendaje today. 12/23/2021: The wound on his right calcaneus is nearly closed; there is just a small area at the most distal aspect of the calcaneus that is open. On the left, the area where we applied to the skin substitute has a healthier-looking bed of granulation tissue. The wound dimensions are not significantly different on this side but the wound surface is improved. 12/30/2021: The wound on the right calcaneus has not changed significantly aside from some accumulation of  callus. On the left, the open area is smaller and continues to have an improved surface. He continues to accumulate callus around the wound. He is here for his third application  of Vendaje. 01/06/2022: The right calcaneal wound is down to just a couple of millimeters. He continues to accumulate periwound callus. He unfortunately got his cast wet earlier in the week and his left foot is macerated, resulting in some superficial skin loss just distal to the open wound. The open wound itself, however, is much smaller and has a healthier appearing surface. He is here for his fourth application of Vendaje. 01/13/2022: The right calcaneal wound is about the same. Unfortunately, once again, his cast got wet and his foot is again macerated. This is caused the left calcaneal wound to enlarge. He is here for his fifth application of Vendaje. 01/20/2022: The right calcaneal wound is very small. There is some periwound callus accumulation. He purchased a new cast protector last week and this has been effective in avoiding the maceration that has been occurring on the left. The left calcaneal wound is narrower and has a healthy and viable-appearing surface. He is here for his 6 application of Vendaje. 01/27/2022: The right calcaneal wound is down to just a pinhole. There is some periwound slough and callus. On the left, the wound is narrower and shorter by about a centimeter. The surface is robust and viable-appearing. Unfortunately, the rep for the trial skin substitute product did not provide any for [place] to use today. 02/04/2022: The right calcaneal wound remains unchanged. There is more accumulated callus. On the left, although the intake nurse measured it a little bit longer, it looks about the same to me. The surface has a layer of slough, but underneath this, there is good granulation tissue. 02/10/2022: The right calcaneus wound is nearly closed. There is still some callus that builds up around the site. The left side looks about the same in terms of dimensions, but the surface is more robust and vital-appearing. 02/16/2022: The area of the right calcaneus that was nearly closed last week has  closed, but there is a small opening at the mid foot where it looks like some moisture got retained and caused some reopening. The left foot wound is narrower and shallower. Both sites have a fair amount of periwound callus and eschar. 02/24/2022: The small midfoot opening on the right calcaneus is a little bit smaller today. The left foot wound is narrower and shallower. He continues to accumulate periwound callus. No concern for infection. 03/01/2022: The patient came to clinic early because he showered and got his cast wet. Fortunately, there is no significant maceration to his foot but the callus softened and it looks like the wound on his left calcaneus may be a little bit wider. The wound on his right calcaneus is just a narrow slit. Continued accumulation of periwound callus bilaterally. 03/08/2022: The wound on his right calcaneus is very nearly closed, just a small pinpoint opening under a bit of eschar; the left wound has come in quite a bit, as well. It is narrower and shorter than at our last visit. Still with accumulated callus and eschar bilaterally. 03/17/2022: The right calcaneal wound is healed. The left wound is smaller and the surface itself is very clean, but there is some blue-green staining on the periwound callus, concerning for Pseudomonas aeruginosa. 03/23/2022: The right calcaneal wound remains closed. The left wound continues to contract. No further blue-green staining. Small amount of callus and slough  accumulation. Electronic Signature(s) Signed: 03/23/2022 11:10:43 AM By: Fredirick Maudlin MD FACS Entered By: Fredirick Maudlin on 03/23/2022 11:10:43 -------------------------------------------------------------------------------- Physical Exam Details Patient Name: Date of Service: [name], [place] 03/23/2022 9:30 A M Medical Record Number: [id number] Patient Account Number: [id number] Date of Birth/Sex: Treating RN: Feb 19, 1976 (46 y.o. M) Primary Care Provider: [name]  Other Clinician: Referring Provider: Treating Provider/Extender: [name] in Treatment: 36 Constitutional . . . . No acute distress.Marland Kitchen Respiratory Normal work of breathing on room air.. Notes 03/23/2022: The right calcaneal wound remains closed. The left wound continues to contract. No further blue-green staining. Small amount of callus and slough accumulation. Electronic Signature(s) Signed: 03/23/2022 11:11:52 AM By: Fredirick Maudlin MD FACS Entered By: Fredirick Maudlin on 03/23/2022 11:11:52 -------------------------------------------------------------------------------- Physician Orders Details Patient Name: Date of Service: [name], [place] 03/23/2022 9:30 A M Medical Record Number: [id number] Patient Account Number: [id number] Date of Birth/Sex: Treating RN: August 14, 1975 (46 y.o. [name] Primary Care Provider: [name] Other Clinician: Referring Provider: Treating Provider/Extender: [name] in Treatment: 78 Verbal / Phone Orders: No Diagnosis Coding ICD-10 Coding Code Description [phone] Non-pressure chronic ulcer of other part of left foot with fat layer exposed Follow-up Appointments ppointment in 1 week. - Dr [name] - Room 3 Wed August 03/30/22 at 10:15am Return A Anesthetic Wound #2 Left Calcaneus (In clinic) Topical Lidocaine 5% applied to wound bed Cellular or Tissue Based Products Wound #2 Left Calcaneus Cellular or Tissue Based Product Type: - Vendaje Cellular or Tissue Based Product applied to wound bed, secured with steri-strips, cover with Adaptic or Mepitel. (DO NOT REMOVE). Bathing/ Shower/ Hygiene May shower with protection but do not get wound dressing(s) wet. - Cover with cast protector (can purchase cast protector at [facility] or [facility] ) Edema Control - Lymphedema / SCD / Other Bilateral Lower Extremities Elevate legs to the level of the heart or above for 30 minutes daily and/or when  sitting, a frequency of: - throughout the day Avoid standing for long periods of time. Moisturize legs daily. - right leg and foot every night. Off-Loading Total Contact Cast to Left Lower Extremity - TCC to left Extremity Wedge shoe to: - Globoped Shoe to right foot Other: - keep pressure off of the bottom of your feet Additional Orders / Instructions Follow Nutritious Diet Wound Treatment Wound #2 - Calcaneus Wound Laterality: Left Cleanser: Normal Saline (Generic) 1 x Per Week/30 Days Discharge Instructions: Cleanse the wound with Normal Saline prior to applying a clean dressing using gauze sponges, not tissue or cotton balls. Cleanser: Wound Cleanser 1 x Per Week/30 Days Discharge Instructions: Cleanse the wound with wound cleanser prior to applying a clean dressing using gauze sponges, not tissue or cotton balls. Peri-Wound Care: Zinc Oxide Ointment 30g tube 1 x Per Week/30 Days Discharge Instructions: Apply Zinc Oxide to periwound with each dressing change Prim Dressing: KerraCel Ag Gelling Fiber Dressing, 4x5 in (silver alginate) 1 x Per Week/30 Days ary Discharge Instructions: Apply silver alginate to wound bed as instructed Secondary Dressing: Optifoam Non-Adhesive Dressing, 4x4 in (Generic) 1 x Per Week/30 Days Discharge Instructions: Apply over primary dressing as directed. Secondary Dressing: Woven Gauze Sponge, Non-Sterile 4x4 in 1 x Per Week/30 Days Discharge Instructions: Apply over primary dressing as directed. Secured With: 62M Medipore H Soft Cloth Surgical T ape, 4 x 10 (in/yd) (Generic) 1 x Per Week/30 Days Discharge Instructions: Secure with tape as directed. Electronic Signature(s) Signed: 03/23/2022 11:18:41 AM  By: Fredirick Maudlin MD FACS Entered By: Fredirick Maudlin on 03/23/2022 11:12:36 -------------------------------------------------------------------------------- Problem List Details Patient Name: Date of Service: [name], [place] 03/23/2022 9:30 A  M Medical Record Number: [id number] Patient Account Number: [id number] Date of Birth/Sex: Treating RN: November 25, 1975 (46 y.o. M) Primary Care Provider: [name] Other Clinician: Referring Provider: Treating Provider/Extender: [name] in Treatment: 82 Active Problems ICD-10 Encounter Code Description Active Date MDM Diagnosis L97.522 Non-pressure chronic ulcer of other part of left foot with fat layer exposed 09/03/2020 No Yes Inactive Problems ICD-10 Code Description Active Date Inactive Date L89.893 Pressure ulcer of other site, stage 3 07/15/2020 07/15/2020 M62.81 Muscle weakness (generalized) 07/15/2020 07/15/2020 I10 Essential (primary) hypertension 07/15/2020 07/15/2020 M86.171 Other acute osteomyelitis, right ankle and foot 09/03/2020 09/03/2020 L97.512 Non-pressure chronic ulcer of other part of right foot with fat layer exposed 09/03/2020 09/03/2020 Resolved Problems Electronic Signature(s) Signed: 03/23/2022 11:09:41 AM By: Fredirick Maudlin MD FACS Entered By: Fredirick Maudlin on 03/23/2022 11:09:41 -------------------------------------------------------------------------------- Progress Note Details Patient Name: Date of Service: [name], [place] 03/23/2022 9:30 A M Medical Record Number: [id number] Patient Account Number: [id number] Date of Birth/Sex: Treating RN: 06/08/1976 (46 y.o. M) Primary Care Provider: [name] Other Clinician: Referring Provider: Treating Provider/Extender: [name] in Treatment: 52 Subjective Chief Complaint Information obtained from Patient Bilateral Plantar Foot Ulcers History of Present Illness (HPI) Wounds are12/03/2020 upon evaluation today patient presents for initial inspection here in our clinic concerning issues he has been having with the bottoms of his feet bilaterally. He states these actually occurred as wounds when he was hospitalized for 5 months secondary to Covid. He was  apparently with tilting bed where he was in an upright position quite frequently and apparently this occurred in some way shape or form during that time. Fortunately there is no sign of active infection at this time. No fevers, chills, nausea, vomiting, or diarrhea. With that being said he still has substantial wounds on the plantar aspects of his feet Theragen require quite a bit of work to get these to heal. He has been using Santyl currently though that is been problematic both in receiving the medication as well as actually paid for it as it is become quite expensive. Prior to the experience with Covid the patient really did not have any major medical problems other than hypertension he does have some mild generalized weakness following the Covid experience. 07/22/2020 on evaluation today patient appears to be doing okay in regard to his foot ulcers I feel like the wound beds are showing signs of better improvement that I do believe the Iodoflex is helping in this regard. With that being said he does have a lot of drainage currently and this is somewhat blue/green in nature which is consistent with Pseudomonas. I do think a culture today would be appropriate for [place] to evaluate and see if that is indeed the case I would likely start him on antibiotic orally as well he is not allergic to Cipro knows of no issues he has had in the past 12/21; patient was admitted to the clinic earlier this month with bilateral presumed pressure ulcers on the bottom of his feet apparently related to excessive pressure from a tilt table arrangement in the intensive care unit. Patient relates this to being on ECMO but I am not really sure that is exactly related to that. I must say I have never seen anything like this. He has fairly extensive full-thickness wounds  extending from his heel towards his midfoot mostly centered laterally. There is already been some healing distally. He does not appear to have an arterial  issue. He has been using gentamicin to the wound surfaces with Iodoflex to help with ongoing debridement 1/6; this is a patient with pressure ulcers on the bottom of his feet related to excessive pressure from a standing position in the intensive care unit. He is complaining of a lot of pain in the right heel. He is not a diabetic. 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 We have been using Iodoflex to help prepare the surfaces of both wounds for an advanced treatment product. He is nonambulatory spending most of his time in a wheelchair I have asked him not to propel the wheelchair with his heels 1/13; in general his wounds look better not much surface area change we have been using Iodoflex as of last week. I did an x-ray of the right heel as the patient was complaining of pain. I had some thoughts about a stress fracture perhaps Achilles tendon problems however what it showed was erosive changes along the inferior aspect of the calcaneus he now has a MRI booked for 1/20. 1/20; in general his wounds continue to be better. Some improvement in the large narrow areas proximally in his foot. He is still complaining of pain in the right heel and tenderness in certain areas of this wound. His MRI is tonight. I am not just looking for osteomyelitis that was brought up on the x-ray I am wondering about stress fractures, tendon ruptures etc. He has no such findings on the left. Also noteworthy is that the patient had critical illness neuropathy and some of the discomfort may be actual improvement in nerve function I am just not sure. These wounds were initially in the setting of severe critical illness related to COVID-19. He was put in a standing position. He may have also been on pressors at the point contributing to tissue ischemia. By his description at some point these wounds were grossly necrotic extending proximally up into the Achilles part of his heel. I do not know that I  have ever really seen pictures of them like this although they may exist in epic We have ordered Tri layer Oasis. I am trying to stimulate some granulation in these areas. This is of course assuming the MRI is negative for infection 1/27; since the patient was last here he saw Dr. Juleen China of infectious disease. He is planned for vancomycin and ceftriaxone. Prior operative culture grew MSSA. Also ordered baseline lab work. He also ordered arterial studies although the ABIs in our clinic were normal as well as his clinical exam these were normal I do not think he needs to see vascular surgery. His ABIs at the [facility] were 1.22 in the right triphasic waveforms with a normal TBI of 1.15 on the left ABI of 1.22 with triphasic waveforms and a normal TBI of 1.08. Finally he saw Dr. Amalia Hailey who will follow him in for 2 months. At this point I do not think he felt that he needed a procedure on the right calcaneal bone. Dr. Juleen China is elected for broad-spectrum antibiotic The patient is still having pain in the right heel. He walks with a walker 2/3; wounds are generally smaller. He is tolerating his IV antibiotics. I believe this is vancomycin and ceftriaxone. We are still waiting for Oasis burn in terms of his out-of-pocket max which he should be meeting soon  given the IV antibiotics, MRIs etc. I have asked him to check in on this. We are using silver collagen in the meantime the wounds look better 2/10; tolerating IV vancomycin and Rocephin. We are waiting to apply for Oasis. Although I am not really sure where he is in his out-of-pocket max. 2/17 started the first application of Oasis trilayer. Still on antibiotics. The wounds have generally look better. The area on the left has a little more surface slough requiring debridement [id number]; second application of Oasis trilayer. The wound surface granulation is generally look better. The area on the left with undermining laterally I think is come in a bit. 10/08/2020  upon evaluation today patient is here today for [employer] application #3. Fortunately he seems to be doing extremely well with regard to this and we are seeing a lot of new epithelial growth which is great news. Fortunately there is no signs of active infection at this time. 10/16/2020 upon evaluation today patient appears to be doing well with regard to his foot ulcers. Do believe the Oasis has been of benefit for him. I do not see any signs of infection right now which is great news and I think that he has a lot of new epithelial growth which is great to see as well. The patient is very pleased to hear all of this. I do think we can proceed with the Oasis trilayer #4 today. 3/18; not as much improvement in these areas on his heels that I was hoping. I did reapply trilateral Oasis today the tissue looks healthier but not as much fill in as I was hoping. 3/25; better looking today I think this is come in a bit the tissue looks healthier. Triple layer Oasis reapplied #6 4/1; somewhat better looking definitely better looking surface not as much change in surface area as I was hoping. He may be spending more time [place] on days then he needs to although he does have heel offloading boots. Triple layer Oasis reapplied #7 4/7; unfortunately apparently [facility] will not approve any further Oasis which is unfortunate since the patient did respond nicely both in terms of the condition of the wound bed as well as surface area. There is still some drainage coming from the wound but not a lot there does not appear to be any infection 4/15; we have been using Hydrofera Blue. He continues to have nice rims of epithelialization on the right greater than the left. The left the epithelialization is coming from the tip of his heel. There is moderate drainage. In this that concerns me about a total contact cast. There is no evidence of infection 4/29; patient has been using Hydrofera Blue with dressing  changes. He has no complaints or issues today. 5/5; using Hydrofera Blue. I actually think that he looks marginally better than the last time I saw this 3 weeks ago. There are rims of epithelialization on the left thumb coming from the medial side on the right. Using Hydrofera Blue 5/12; using Hydrofera Blue. These continue to make improvements in surface area. His drainage was not listed as severe I therefore went ahead and put a cast on the left foot. Right foot we will continue to dress his previous 5/16; back for first total contact cast change. He did not tolerate this particularly well cast injury on the anterior tibia among other issues. Difficulty sleeping. I talked him about this in some detail and afterwards is elected to continue. I told him I  would like to have a cast on for 3 weeks to see if this is going to help at all. I think he agreed 5/19; I think the wound is better. There is no tunneling towards his midfoot. The undermining medially also looks better. He has a rim of new skin distally. I think we are making progress here. The area on the left also continues to look somewhat better to me using Hydrofera Blue. He has a list of complaints about the cast but none of them seem serious 5/26; patient presents for 1 week follow-up. He has been using a total contact cast and tolerating this well. Hydrofera Blue is the main dressing used. He denies signs of infection. 6/2 Hydrofera Blue total contact cast on the left. These were large ulcers that formed in intensive care unit where the patient was recovering from [place]. May have had something to do with being ventilated in an upright positiono Pressors etc. We have been able to get the areas down considerably and a viable surface. There is some epithelialization in both sides. Note made of drainage 6/9; changed to [facility] last time because of drainage. He arrives with better looking surfaces and dimensions on the left than the right.  Paradoxically the right actually probes more towards his midfoot the left is largely close down but both of these look improved. Using a total contact cast on the left 6/16; complex wounds on his bilateral plantar heels which were initially pressure injury from a stay in the ICU with COVID. We have been using silver alginate most recently. His dimensions of come in quite dramatically however not recently. We have been putting the left foot in a total contact cast 6/23; complex wounds on the bilateral plantar heels. I been putting the left in the cast paradoxically the area on the right is the one that is going towards closure at a faster rate. Quite a bit of drainage on the left. The patient went to see Dr. Amalia Hailey who said he was going to standby for skin grafts. I had actually considered sending him for skin grafts however he would be mandatorily off his feet for a period of weeks to months. I am thinking that the area on the right is going to close on its own the area on the left has been more stubborn even though we have him in a total contact cast 6/30; took him out of a total contact cast last week is the right heel seem to be making better progress than the left where I was placing the cast. We are using silver alginate. Both wounds are smaller right greater than left 7/12; both wounds look as though they are making some progress. We are using silver alginate. Heel offloading boots 7/26; very gradual progress especially on the right. Using silver alginate. He is wearing heel offloading boots 8/18; he continues to close these wounds down very gradually. Using silver alginate. The problem polymen being definitive about this is areas of what appears to be callus around the margins. This is not a 100% of the area but certainly sizable especially on the right 9/1; bilateral plantar feet wounds secondary to prolonged pressure while being ventilated for COVID-19 in an upright position. Essentially  pressure ulcers on the bottom of his feet. He is made substantial progress using silver alginate. 9/14; bilateral plantar feet wounds secondary to prolonged pressure. Making progress using silver alginate. 9/29 bilateral plantar feet wounds secondary to prolonged pressure. I changed him to Iodoflex last  week. MolecuLight showing reddened blush fluorescence 10/11; patient presents for follow-up. He has no issues or complaints today. He denies signs of infection. He continues to use Iodoflex and antibiotic ointment to the wound beds. 10/27; 2-week follow-up. No evidence of infection. He has callus and thick dry skin around the wound margins we have been using Iodoflex and Bactroban which was in response to a moderate left MolecuLight reddish blush fluorescence. 11/10; 2-week follow-up. Wound margins again have thick callus however the measurements of the actual wound sites are a lot smaller. Everything looks reasonably healthy here. We have been using Iodoflex He was approved for prime matrix but I have elected to delay this given the improvement in the surface area. Hopefully I will not regret that decision as were getting close to the end of the year in terms of insurance payment 12/8; 2-week follow-up. Wounds are generally smaller in size. These were initially substantial wounds extending into the forefoot all the way into the heel on the bilateral plantar feet. They are now both located on the plantar heel distal aspect both of these have a lot of callus around the wounds I used a #5 curette to remove this on the right and the left also some subcutaneous debris to try and get the wound edges were using Iodoflex. He has heel offloading shoe 12/22; 2-week follow-up. Not really much improvement. He has thick callus around the outer edges of both wounds. I remove this there is some nonviable subcutaneous tissue as well. We have been using Iodoflex. Her intake nurse and myself spontaneously thought of  a total contact cast I went back in May. At that time we really were not seeing much of an improvement with a cast although the wound was in a much different situation I would like to retry this in 2 weeks and I discussed this with the patient 08/12/2021; the patient has had some improvement with the Iodoflex. The the area on the left heel plantar more improved than the right. I had to put him in a total contact cast on the left although I decided to put that off for 2 weeks. I am going to change his primary dressing to silver collagen. I think in both areas he has had some improvement most of the healing seems to be more proximal in the heel. The wounds are in the mid aspect. A lot of thick callus on the right heel however. 1/19; we are using silver collagen on both plantar heel areas. He has had some improvement today. The left did not require any debridement. He still had some eschar on the right that was debrided but both seem to have contracted. I did not put it total contact cast on him today 2/2 we have been using silver collagen. The area on the right plantar heel has areas that appear to be epithelialized interspersed with dry flaking callus and dry skin. I removed this. This really looks better than on the other side. On the left still a large area with raised edges and debris on the surface. 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 He has no issues or complaints today. 2/9; not much change in the left foot wound with 1 week of a cast we are using silver collagen. Silver collagen on the right side. The right side has been the better wound surface. We will  reapply the total contact cast on the left 2/16; not much improvement on either side I been using silver collagen with a total contact cast on the left. I'm changing the Hydrofera Blue still with a  total contact cast on the left 2/23; some improvement on both sides. Disappointing that he has thick callus around the area that we are putting in a total contact cast on the left. We've been using Hydrofera Blue on both wound areas. This is a man who at essentially pressure ulcers in addition to ischemia caused by medications to support his blood pressure (pressors) in the ICU. He was being ventilated in the standing position for severe Covid. A Shiley the wounds extended across his entire foot but are now localized to his plantar heels bilaterally. We have made progress however neither areas healed. I continue to think the total contact cast is helped albeit painstakingly slowly. He has never wanted a plastic surgery consult although I don't know that they would be interested in grafting in area in this location. 10/07/2021: Continued improvement bilaterally. There is still some callus around the left wound, despite the total contact cast. He has some increased pain in his right midfoot around 1 particular area. This has been painful in the past but seems to be a little bit worse. When his cast was removed today, there was an area on the heel of the left foot that looks a bit macerated. He is also complaining of pain in his left thigh and hip which he thinks is secondary to the limb length discrepancy caused by the cast. 10/14/2021: He continues to improve. A little bit less callus around the left wound. He continues to endorse pain in his right midfoot, but this is not as significant as it was last week. The maceration on his left heel is improved. 10/21/2021: Continued improvement to both wounds. The maceration on his left heel is no longer evident. Less callus bilaterally. Epithelialization progressing. 10/28/2021: Significant improvement this week. The right sided wound is nearly closed with just a small open area at the middle. No maceration seen on the left heel. Continued epithelialization on both  sides. No concern for infection. 11/04/2021: T oday, the wounds were measured a little bit differently and come out as larger, but I actually think they are about the same to potentially even smaller, particularly on the left. He continues to accumulate some callus on the right. 11/11/2021: T oday, the patient is expressing some concern that the left wound, despite being in the total contact cast, is not progressing at the same rate as the 1 on the right. He is interested in trying a week without the cast to see how the wound does. The wounds are roughly the same size as last week, with the right perhaps being a little bit smaller. He continues to build up callus on both sites. 11/18/2021: Last week, I permitted the patient to go without his total contact cast, just to see if the cast was really making any difference. Today, both wounds have deteriorated to some extent, suggesting that the cast is providing benefit, at least on the left. Both are larger and have accumulated callus, slough, and other debris. 11/26/2021: I debrided both wounds quite aggressively last week in an effort to stimulate the healing cascade. This appears to have been effective as the left sided wound is a full centimeter shorter in length. Although the right was measured slightly larger, on inspection, it looks as though an area of epithelialized  tissue was included in the measurements. We have been using PolyMem Ag on the wound surfaces with a total contact cast to the left. 12/02/2021: It appears that the intake personnel are including epithelialized tissue in his wound measurements; the right wound is almost completely epithelialized; there is just a crater at the proximal midfoot with some open areas. On the left, he has built up some callus, but the overall wound surface looks good. There is some senescent skin around the wound margin. He has been in PolyMem Ag bilaterally with a total contact cast on the left. 12/09/2021: The  right wound is nearly closed; there is just a small open area at the mid calcaneus. On the left, the wound is smaller with minimal callus buildup. No significant drainage. 12/16/2021: The right calcaneal wound remains minimally open at the mid calcaneus; the rest has epithelialized. On the left, the wound is also a little bit smaller. There is some senescent tissue on the periphery. He is getting his first application of a trial skin substitute called Vendaje today. 12/23/2021: The wound on his right calcaneus is nearly closed; there is just a small area at the most distal aspect of the calcaneus that is open. On the left, the area where we applied to the skin substitute has a healthier-looking bed of granulation tissue. The wound dimensions are not significantly different on this side but the wound surface is improved. 12/30/2021: The wound on the right calcaneus has not changed significantly aside from some accumulation of callus. On the left, the open area is smaller and continues to have an improved surface. He continues to accumulate callus around the wound. He is here for his third application of Vendaje. 01/06/2022: The right calcaneal wound is down to just a couple of millimeters. He continues to accumulate periwound callus. He unfortunately got his cast wet earlier in the week and his left foot is macerated, resulting in some superficial skin loss just distal to the open wound. The open wound itself, however, is much smaller and has a healthier appearing surface. He is here for his fourth application of Vendaje. 01/13/2022: The right calcaneal wound is about the same. Unfortunately, once again, his cast got wet and his foot is again macerated. This is caused the left calcaneal wound to enlarge. He is here for his fifth application of Vendaje. 01/20/2022: The right calcaneal wound is very small. There is some periwound callus accumulation. He purchased a new cast protector last week and this has been  effective in avoiding the maceration that has been occurring on the left. The left calcaneal wound is narrower and has a healthy and viable-appearing surface. He is here for his 6 application of Vendaje. 01/27/2022: The right calcaneal wound is down to just a pinhole. There is some periwound slough and callus. On the left, the wound is narrower and shorter by about a centimeter. The surface is robust and viable-appearing. Unfortunately, the rep for the trial skin substitute product did not provide any for [place] to use today. 02/04/2022: The right calcaneal wound remains unchanged. There is more accumulated callus. On the left, although the intake nurse measured it a little bit longer, it looks about the same to me. The surface has a layer of slough, but underneath this, there is good granulation tissue. 02/10/2022: The right calcaneus wound is nearly closed. There is still some callus that builds up around the site. The left side looks about the same in terms of dimensions, but the surface is more  robust and vital-appearing. 02/16/2022: The area of the right calcaneus that was nearly closed last week has closed, but there is a small opening at the mid foot where it looks like some moisture got retained and caused some reopening. The left foot wound is narrower and shallower. Both sites have a fair amount of periwound callus and eschar. 02/24/2022: The small midfoot opening on the right calcaneus is a little bit smaller today. The left foot wound is narrower and shallower. He continues to accumulate periwound callus. No concern for infection. 03/01/2022: The patient came to clinic early because he showered and got his cast wet. Fortunately, there is no significant maceration to his foot but the callus softened and it looks like the wound on his left calcaneus may be a little bit wider. The wound on his right calcaneus is just a narrow slit. Continued accumulation of periwound callus bilaterally. 03/08/2022: The  wound on his right calcaneus is very nearly closed, just a small pinpoint opening under a bit of eschar; the left wound has come in quite a bit, as well. It is narrower and shorter than at our last visit. Still with accumulated callus and eschar bilaterally. 03/17/2022: The right calcaneal wound is healed. The left wound is smaller and the surface itself is very clean, but there is some blue-green staining on the periwound callus, concerning for Pseudomonas aeruginosa. 03/23/2022: The right calcaneal wound remains closed. The left wound continues to contract. No further blue-green staining. Small amount of callus and slough accumulation. Patient History Information obtained from Patient. Family History Cancer - Maternal Grandparents, Diabetes - Father,Paternal Grandparents, Heart Disease - Maternal Grandparents, Hypertension - Father,Paternal Grandparents, Lung Disease - Siblings, No family history of Hereditary Spherocytosis, Kidney Disease, Seizures, Stroke, Thyroid Problems, Tuberculosis. Social History Never smoker, Marital Status - Married, Alcohol Use - Never, Drug Use - No History, Caffeine Use - Daily - tea, soda. Medical History Eyes Denies history of Cataracts, Glaucoma, Optic Neuritis Ear/Nose/Mouth/Throat Denies history of Chronic sinus problems/congestion, Middle ear problems Hematologic/Lymphatic Denies history of Anemia, Hemophilia, Human Immunodeficiency Virus, Lymphedema, Sickle Cell Disease Respiratory Patient has history of Asthma Denies history of Aspiration, Chronic Obstructive Pulmonary Disease (COPD), Pneumothorax, Sleep Apnea, Tuberculosis Cardiovascular Patient has history of Angina - with COVID, Hypertension Denies history of Arrhythmia, Congestive Heart Failure, Coronary Artery Disease, Deep Vein Thrombosis, Hypotension, Myocardial Infarction, Peripheral Arterial Disease, Peripheral Venous Disease, Phlebitis, Vasculitis Gastrointestinal Denies history of  Cirrhosis , Colitis, Crohnoos, Hepatitis A, Hepatitis B, Hepatitis C Endocrine Denies history of Type I Diabetes, Type II Diabetes Genitourinary Denies history of End Stage Renal Disease Immunological Denies history of Lupus Erythematosus, Raynaudoos, Scleroderma Integumentary (Skin) Denies history of History of Burn Musculoskeletal Denies history of Gout, Rheumatoid Arthritis, Osteoarthritis, Osteomyelitis Neurologic Denies history of Dementia, Neuropathy, Quadriplegia, Paraplegia, Seizure Disorder Oncologic Denies history of Received Chemotherapy, Received Radiation Psychiatric Denies history of Anorexia/bulimia, Confinement Anxiety Hospitalization/Surgery History - COVID PNA 07/22/2019- 11/14/2019. - 03/27/2020 wound debridement/ skin graft. Medical A Surgical History Notes nd Constitutional Symptoms (General Health) COVID PNA 07/22/2019-11/14/2019 VENT ECMO, foot drop left foot , Genitourinary kidney stone Psychiatric anxiety Objective Constitutional No acute distress.. Vitals Time Taken: 9:45 AM, Height: 69 in, Weight: 280 lbs, BMI: 41.3, Temperature: 98.6 F, Pulse: 69 bpm, Respiratory Rate: 18 breaths/min, Blood Pressure: 137/80 mmHg. Respiratory Normal work of breathing on room air.. General Notes: 03/23/2022: The right calcaneal wound remains closed. The left wound continues to contract. No further blue-green staining. Small amount of callus and slough accumulation. Integumentary (  Hair, Skin) Wound #2 status is Open. Original cause of wound was Pressure Injury. The date acquired was: 10/07/2019. The wound has been in treatment 88 weeks. The wound is located on the Left Calcaneus. The wound measures 1.2cm length x 0.2cm width x 0.2cm depth; 0.188cm^2 area and 0.038cm^3 volume. There is Fat Layer (Subcutaneous Tissue) exposed. There is no tunneling or undermining noted. There is a medium amount of serosanguineous drainage noted. The wound margin is distinct with the  outline attached to the wound base. There is medium (34-66%) red granulation within the wound bed. There is a medium (34-66%) amount of necrotic tissue within the wound bed including Eschar and Adherent Slough. Assessment Active Problems ICD-10 Non-pressure chronic ulcer of other part of left foot with fat layer exposed Procedures Wound #2 Pre-procedure diagnosis of Wound #2 is a Pressure Ulcer located on the Left Calcaneus . There was a Selective/Open Wound Non-Viable Tissue Debridement with a total area of 0.24 sq cm performed by Fredirick Maudlin, MD. With the following instrument(s): Curette to remove Non-Viable tissue/material. Material removed includes Callus after achieving pain control using Lidocaine 4% Topical Solution. No specimens were taken. A time out was conducted at 10:22, prior to the start of the procedure. A Minimum amount of bleeding was controlled with Pressure. The procedure was tolerated well with a pain level of 0 throughout and a pain level of 0 following the procedure. Post Debridement Measurements: 1.2cm length x 0.2cm width x 0.2cm depth; 0.038cm^3 volume. Post debridement Stage noted as Category/Stage III. Character of Wound/Ulcer Post Debridement is improved. Post procedure Diagnosis Wound #2: Same as Pre-Procedure Pre-procedure diagnosis of Wound #2 is a Pressure Ulcer located on the Left Calcaneus. A skin graft procedure using a bioengineered skin substitute/cellular or tissue based product was performed by Fredirick Maudlin, MD with the following instrument(s): Forceps and Scissors. Other was applied and secured with Steri-Strips. 8 sq cm of product was utilized and 0 sq cm was wasted. Post Application, Adaptic;gauze was applied. A Time Out was conducted at 10:22, prior to the start of the procedure. The procedure was tolerated well with a pain level of 0 throughout and a pain level of 0 following the procedure. Post procedure Diagnosis Wound #2: Same as  Pre-Procedure General Notes: Skin Sub **** Vendaje******Trial. Pre-procedure diagnosis of Wound #2 is a Pressure Ulcer located on the Left Calcaneus . There was a T Contact Cast Procedure by Fredirick Maudlin, MD. otal Post procedure Diagnosis Wound #2: Same as Pre-Procedure Plan Follow-up Appointments: Return Appointment in 1 week. - Dr [name] - Room 3 Wed August 03/30/22 at 10:15am Anesthetic: Wound #2 Left Calcaneus: (In clinic) Topical Lidocaine 5% applied to wound bed Cellular or Tissue Based Products: Wound #2 Left Calcaneus: Cellular or Tissue Based Product Type: - Vendaje Cellular or Tissue Based Product applied to wound bed, secured with steri-strips, cover with Adaptic or Mepitel. (DO NOT REMOVE). Bathing/ Shower/ Hygiene: May shower with protection but do not get wound dressing(s) wet. - Cover with cast protector (can purchase cast protector at [facility] or [facility] ) Edema Control - Lymphedema / SCD / Other: Elevate legs to the level of the heart or above for 30 minutes daily and/or when sitting, a frequency of: - throughout the day Avoid standing for long periods of time. Moisturize legs daily. - right leg and foot every night. Off-Loading: T Contact Cast to Left Lower Extremity - TCC to left Extremity otal Wedge shoe to: - Globoped Shoe to right foot Other: - keep  pressure off of the bottom of your feet Additional Orders / Instructions: Follow Nutritious Diet WOUND #2: - Calcaneus Wound Laterality: Left Cleanser: Normal Saline (Generic) 1 x Per Week/30 Days Discharge Instructions: Cleanse the wound with Normal Saline prior to applying a clean dressing using gauze sponges, not tissue or cotton balls. Cleanser: Wound Cleanser 1 x Per Week/30 Days Discharge Instructions: Cleanse the wound with wound cleanser prior to applying a clean dressing using gauze sponges, not tissue or cotton balls. Peri-Wound Care: Zinc Oxide Ointment 30g tube 1 x Per Week/30 Days Discharge  Instructions: Apply Zinc Oxide to periwound with each dressing change Prim Dressing: KerraCel Ag Gelling Fiber Dressing, 4x5 in (silver alginate) 1 x Per Week/30 Days ary Discharge Instructions: Apply silver alginate to wound bed as instructed Secondary Dressing: Optifoam Non-Adhesive Dressing, 4x4 in (Generic) 1 x Per Week/30 Days Discharge Instructions: Apply over primary dressing as directed. Secondary Dressing: Woven Gauze Sponge, Non-Sterile 4x4 in 1 x Per Week/30 Days Discharge Instructions: Apply over primary dressing as directed. Secured With: 40M Medipore H Soft Cloth Surgical T ape, 4 x 10 (in/yd) (Generic) 1 x Per Week/30 Days Discharge Instructions: Secure with tape as directed. 03/23/2022: The right calcaneal wound remains closed. The left wound continues to contract. No further blue-green staining. Small amount of callus and slough accumulation. I used a curette to debride slough and callus from the left plantar foot wound. I discontinued the gentamicin as there is no ongoing concern for Pseudomonas. I then applied VENDAJE (donated skin substitute, no charge) to the wound and secured in place with Adaptic and Steri-Strips. A gauze sponge was used as a bolster. We then applied a total contact cast. He was cautioned to continue to protect his right plantar surface to avoid reopening the wound. Follow-up in 1 week. Electronic Signature(s) Signed: 03/28/2022 8:03:56 AM By: [name] MD FACS Previous Signature: 03/23/2022 11:15:28 AM Version By: [name] MD FACS Entered By: [name] on 03/28/2022 08:03:55 -------------------------------------------------------------------------------- HxROS Details Patient Name: Date of Service: [name], [name] 03/23/2022 9:30 A M Medical Record Number: [id number] Patient Account Number: [id number] Date of Birth/Sex: Treating RN: [birth year] (46 y.o. M) Primary Care Provider: Dorinda Hill Other Clinician: Referring  Provider: Treating Provider/Extender: Jana Hakim in Treatment: 24 Information Obtained From Patient Constitutional Symptoms (General Health) Medical History: Past Medical History Notes: COVID PNA 07/22/2019-11/14/2019 VENT ECMO, foot drop left foot , Eyes Medical History: Negative for: Cataracts; Glaucoma; Optic Neuritis Ear/Nose/Mouth/Throat Medical History: Negative for: Chronic sinus problems/congestion; Middle ear problems Hematologic/Lymphatic Medical History: Negative for: Anemia; Hemophilia; Human Immunodeficiency Virus; Lymphedema; Sickle Cell Disease Respiratory Medical History: Positive for: Asthma Negative for: Aspiration; Chronic Obstructive Pulmonary Disease (COPD); Pneumothorax; Sleep Apnea; Tuberculosis Cardiovascular Medical History: Positive for: Angina - with COVID; Hypertension Negative for: Arrhythmia; Congestive Heart Failure; Coronary Artery Disease; Deep Vein Thrombosis; Hypotension; Myocardial Infarction; Peripheral Arterial Disease; Peripheral Venous Disease; Phlebitis; Vasculitis Gastrointestinal Medical History: Negative for: Cirrhosis ; Colitis; Crohns; Hepatitis A; Hepatitis B; Hepatitis C Endocrine Medical History: Negative for: Type I Diabetes; Type II Diabetes Genitourinary Medical History: Negative for: End Stage Renal Disease Past Medical History Notes: kidney stone Immunological Medical History: Negative for: Lupus Erythematosus; Raynauds; Scleroderma Integumentary (Skin) Medical History: Negative for: History of Burn Musculoskeletal Medical History: Negative for: Gout; Rheumatoid Arthritis; Osteoarthritis; Osteomyelitis Neurologic Medical History: Negative for: Dementia; Neuropathy; Quadriplegia; Paraplegia; Seizure Disorder Oncologic Medical History: Negative for: Received Chemotherapy; Received Radiation Psychiatric Medical History: Negative for: Anorexia/bulimia; Confinement Anxiety Past Medical  History Notes: anxiety Immunizations Pneumococcal Vaccine: Received Pneumococcal Vaccination: No Implantable Devices None Hospitalization / Surgery History Type of Hospitalization/Surgery COVID PNA 07/22/2019- 11/14/2019 03/27/2020 wound debridement/ skin graft Family and Social History Cancer: Yes - Maternal Grandparents; Diabetes: Yes - Father,Paternal Grandparents; Heart Disease: Yes - Maternal Grandparents; Hereditary Spherocytosis: No; Hypertension: Yes - Father,Paternal Grandparents; Kidney Disease: No; Lung Disease: Yes - Siblings; Seizures: No; Stroke: No; Thyroid Problems: No; Tuberculosis: No; Never smoker; Marital Status - Married; Alcohol Use: Never; Drug Use: No History; Caffeine Use: Daily - tea, soda; Financial Concerns: No; Food, Clothing or Shelter Needs: No; Support System Lacking: No; Transportation Concerns: No Electronic Signature(s) Signed: 03/23/2022 11:18:41 AM By: Fredirick Maudlin MD FACS Entered By: Fredirick Maudlin on 03/23/2022 11:10:50 -------------------------------------------------------------------------------- Total Contact Cast Details Patient Name: Date of Service: [name], [place] 03/23/2022 9:30 A M Medical Record Number: [id number] Patient Account Number: [id number] Date of Birth/Sex: Treating RN: 05-27-1976 (46 y.o. [name] Primary Care Provider: [name] Other Clinician: Referring Provider: Treating Provider/Extender: [name] in Treatment: 41 T Contact Cast Applied for Wound Assessment: otal Wound #2 Left Calcaneus Performed By: Physician Fredirick Maudlin, MD Post Procedure Diagnosis Same as Pre-procedure Electronic Signature(s) Signed: 03/23/2022 5:21:55 PM By: [name] Signed: 03/24/2022 7:42:26 AM By: Fredirick Maudlin MD FACS Entered By: [name] on 03/23/2022 16:54:31 -------------------------------------------------------------------------------- [place] Details Patient  Name: Date of Service: [name], [place]. 03/23/2022 Medical Record Number: [id number] Patient Account Number: [id number] Date of Birth/Sex: Treating RN: Dec 26, 1975 (46 y.o. M) Primary Care Provider: [name] Other Clinician: Referring Provider: Treating Provider/Extender: [name] in Treatment: 84 Diagnosis Coding ICD-10 Codes Code Description [phone] Non-pressure chronic ulcer of other part of left foot with fat layer exposed Facility Procedures CPT4 Code: [id number] Description: [id number] - SKIN SUB GRAFT TRNK/ARM/LEG ICD-10 Diagnosis Description L97.522 Non-pressure chronic ulcer of other part of left foot with fat layer exposed Modifier: Quantity: 1 CPT4 Code: [id number] Description: [id number] - APPLY TOTAL CONTACT LEG CAST ICD-10 Diagnosis Description L97.522 Non-pressure chronic ulcer of other part of left foot with fat layer exposed Modifier: Quantity: 1 CPT4 Code: [id number] Description: [id number] Vendaje 4x4 per SQ CM ICD-10 Diagnosis Description L97.522 Non-pressure chronic ulcer of other part of left foot with fat layer exposed Modifier: Quantity: 1 Physician Procedures : CPT4 Code Description Modifier [id number] - WC PHYS LEVEL 4 - EST PT 25 ICD-10 Diagnosis Description L97.522 Non-pressure chronic ulcer of other part of left foot with fat layer exposed Quantity: 1 : [id number] - WC PHYS SKIN SUB GRAFT TRNK/ARM/LEG ICD-10 Diagnosis Description L97.522 Non-pressure chronic ulcer of other part of left foot with fat layer exposed Quantity: 1 : [id number] - WC PHYS APPLY TOTAL CONTACT CAST ICD-10 Diagnosis Description L97.522 Non-pressure chronic ulcer of other part of left foot with fat layer exposed Quantity: 1 Electronic Signature(s) Signed: 04/07/2022 12:14:32 PM By: Maye Hides Signed: 04/21/2022 10:03:28 AM By: Fredirick Maudlin MD FACS Previous Signature: 03/23/2022 11:16:23 AM Version By: Fredirick Maudlin MD FACS Entered By: Maye Hides  on 04/07/2022 12:14:32

## 2022-03-24 NOTE — Progress Notes (Signed)
RACHIT, GRIM ([id number]) Visit Report for 03/23/2022 Arrival Information Details Patient Name: Date of Service: [name], TO [name] 03/23/2022 9:30 A M Medical Record Number: [id number] Patient Account Number: [id number] Date of Birth/Sex: Treating RN: 1975/10/19 (46 y.o. [name] Primary Care Mykeria Garman: Dorinda Hill Other Clinician: Referring Hagen Tidd: Treating Aryannah Mohon/Extender: Jana Hakim in Treatment: 38 Visit Information History Since Last Visit Added or deleted any medications: No Patient Arrived: Wheel Chair Any new allergies or adverse reactions: No Arrival Time: 09:36 Had a fall or experienced change in No Accompanied By: spouse and son activities of daily living that may affect Transfer Assistance: None risk of falls: Patient Identification Verified: Yes Signs or symptoms of abuse/neglect since last visito No Patient Requires Transmission-Based Precautions: No Hospitalized since last visit: No Patient Has Alerts: No Implantable device outside of the clinic excluding No cellular tissue based products placed in the center since last visit: Has Dressing in Place as Prescribed: Yes Pain Present Now: Yes Electronic Signature(s) Signed: 03/23/2022 5:21:55 PM By: [name] RN Entered By: [name] on 03/23/2022 09:45:41 -------------------------------------------------------------------------------- Encounter Discharge Information Details Patient Name: Date of Service: [name], [name] 03/23/2022 9:30 A M Medical Record Number: [id number] Patient Account Number: [id number] Date of Birth/Sex: Treating RN: [birth year] (46 y.o. [name] Primary Care Kenleigh Toback: Dorinda Hill Other Clinician: Referring Jonetta Dagley: Treating Helga Asbury/Extender: Jana Hakim in Treatment: 85 Encounter Discharge Information Items Post Procedure Vitals Discharge Condition: Stable Temperature (F): 98.6 Ambulatory Status:  Wheelchair Pulse (bpm): 69 Discharge Destination: Home Respiratory Rate (breaths/min): 18 Transportation: Private Auto Blood Pressure (mmHg): 137/80 Accompanied By: spouse Schedule Follow-up Appointment: Yes Clinical Summary of Care: Patient Declined Electronic Signature(s) Signed: 03/23/2022 5:21:55 PM By: [name] RN Entered By: [name] on 03/23/2022 16:56:43 -------------------------------------------------------------------------------- Lower Extremity Assessment Details Patient Name: Date of Service: [name], TO [name] 03/23/2022 9:30 A M Medical Record Number: [id number] Patient Account Number: [id number] Date of Birth/Sex: Treating RN: [birth year] (46 y.o. [name] Primary Care Bilaal Leib: Dorinda Hill Other Clinician: Referring Madonna Flegal: Treating Marciano Mundt/Extender: Jana Hakim in Treatment: 34 Edema Assessment Assessed: [name]: No] [Right: No] Edema: [Left: No] [Right: No] Calf Left: Right: Point of Measurement: 29 cm From Medial Instep 40.5 cm 43.8 cm Ankle Left: Right: Point of Measurement: 9 cm From Medial Instep 24.5 cm 24 cm Electronic Signature(s) Signed: 03/23/2022 5:21:55 PM By: [name] RN Entered By: [name] on 03/23/2022 10:05:43 -------------------------------------------------------------------------------- Multi Wound Chart Details Patient Name: Date of Service: [name], [name] 03/23/2022 9:30 A M Medical Record Number: [id number] Patient Account Number: [id number] Date of Birth/Sex: Treating RN: April 24, 1976 (46 y.o. M) Primary Care Pearlena Ow: Dorinda Hill Other Clinician: Referring Latresa Gasser: Treating Ingris Pasquarella/Extender: Jana Hakim in Treatment: 24 Vital Signs Height(in): 69 Pulse(bpm): 69 Weight(lbs): 280 Blood Pressure(mmHg): 137/80 Body Mass Index(BMI): 41.3 Temperature(F): 98.6 Respiratory Rate(breaths/min): 18 Photos: [N/A:N/A] Left Calcaneus N/A  N/A Wound Location: Pressure Injury N/A N/A Wounding Event: Pressure Ulcer N/A N/A Primary Etiology: Asthma, Angina, Hypertension N/A N/A Comorbid History: 10/07/2019 N/A N/A Date Acquired: 62 N/A N/A Weeks of Treatment: Open N/A N/A Wound Status: No N/A N/A Wound Recurrence: 1.2x0.2x0.2 N/A N/A Measurements L x W x D (cm) 0.188 N/A N/A A (cm) : rea 0.038 N/A N/A Volume (cm) : 99.30% N/A N/A % Reduction in A rea: 99.90% N/A N/A % Reduction in Volume: Category/Stage III N/A N/A Classification: Medium N/A N/A Exudate A mount: Serosanguineous  N/A N/A Exudate Type: red, brown N/A N/A Exudate Color: Distinct, outline attached N/A N/A Wound Margin: Medium (34-66%) N/A N/A Granulation A mount: Red N/A N/A Granulation Quality: Medium (34-66%) N/A N/A Necrotic A mount: Eschar, Adherent Slough N/A N/A Necrotic Tissue: Fat Layer (Subcutaneous Tissue): Yes N/A N/A Exposed Structures: Fascia: No Tendon: No Muscle: No Joint: No Bone: No Large (67-100%) N/A N/A Epithelialization: Debridement - Selective/Open Wound N/A N/A Debridement: Pre-procedure Verification/Time Out 10:22 N/A N/A Taken: Lidocaine 4% Topical Solution N/A N/A Pain Control: Callus N/A N/A Tissue Debrided: Non-Viable Tissue N/A N/A Level: 0.24 N/A N/A Debridement A (sq cm): rea Curette N/A N/A Instrument: Minimum N/A N/A Bleeding: Pressure N/A N/A Hemostasis A chieved: 0 N/A N/A Procedural Pain: 0 N/A N/A Post Procedural Pain: Procedure was tolerated well N/A N/A Debridement Treatment Response: 1.2x0.2x0.2 N/A N/A Post Debridement Measurements L x W x D (cm) 0.038 N/A N/A Post Debridement Volume: (cm) Category/Stage III N/A N/A Post Debridement Stage: Debridement N/A N/A Procedures Performed: Treatment Notes Electronic Signature(s) Signed: 03/23/2022 11:09:49 AM By: Fredirick Maudlin MD FACS Entered By: Fredirick Maudlin on 03/23/2022  11:09:49 -------------------------------------------------------------------------------- Multi-Disciplinary Care Plan Details Patient Name: Date of Service: [name], [name] 03/23/2022 9:30 A M Medical Record Number: [id number] Patient Account Number: [id number] Date of Birth/Sex: Treating RN: [birth year] (46 y.o. [name] Primary Care Gradie Ohm: [name] Other Clinician: Referring Minsa Weddington: Treating Carlynn Leduc/Extender: [name] in Treatment: 15 Tillman reviewed with physician Active Inactive Wound/Skin Impairment Nursing Diagnoses: Impaired tissue integrity Knowledge deficit related to ulceration/compromised skin integrity Goals: Patient/caregiver will verbalize understanding of skin care regimen Date Initiated: 07/15/2020 Target Resolution Date: 07/07/2022 Goal Status: Active Ulcer/skin breakdown will have a volume reduction of 30% by week 4 Date Initiated: 07/15/2020 Date Inactivated: 08/20/2020 Target Resolution Date: 09/03/2020 Goal Status: Unmet Unmet Reason: no major changes. Ulcer/skin breakdown will heal within 14 weeks Date Initiated: 12/04/2020 Date Inactivated: 12/10/2020 Target Resolution Date: 12/10/2020 Unmet Reason: wounds still open at 14 Goal Status: Unmet weeks and today 21 weeks. Interventions: Assess patient/caregiver ability to obtain necessary supplies Assess patient/caregiver ability to perform ulcer/skin care regimen upon admission and as needed Assess ulceration(s) every visit Provide education on ulcer and skin care Treatment Activities: Skin care regimen initiated : 07/15/2020 Topical wound management initiated : 07/15/2020 Notes: Electronic Signature(s) Signed: 03/23/2022 5:21:55 PM By: [name] Entered By: [name] on 03/23/2022 16:54:44 -------------------------------------------------------------------------------- Pain Assessment Details Patient Name: Date of  Service: [name], Ali Molina 03/23/2022 9:30 A M Medical Record Number: [id number] Patient Account Number: [id number] Date of Birth/Sex: Treating RN: 1976-04-26 (46 y.o. [name] Primary Care Shylo Zamor: [name] Other Clinician: Referring Amil Moseman: Treating Uziel Covault/Extender: [name] in Treatment: 73 Active Problems Location of Pain Severity and Description of Pain Patient Has Paino Yes Site Locations Pain Location: Generalized Pain With Dressing Change: No Duration of the Pain. Constant / Intermittento Constant Rate the pain. Current Pain Level: 3 Worst Pain Level: 10 Least Pain Level: 3 Tolerable Pain Level: 10 Character of Pain Describe the Pain: Difficult to Pinpoint Pain Management and Medication Current Pain Management: Medication: No Cold Application: No Rest: Yes Massage: No Activity: No T.E.N.S.: No Heat Application: No Leg drop or elevation: No Is the Current Pain Management Adequate: Adequate How does your wound impact your activities of daily livingo Sleep: No Bathing: No Appetite: No Relationship With Others: No Bladder Continence: No Emotions: No Bowel Continence: No Work: No Toileting:  No Drive: No Dressing: No Hobbies: No Electronic Signature(s) Signed: 03/23/2022 5:21:55 PM By: [name] RN Entered By: [name] on 03/23/2022 09:46:56 -------------------------------------------------------------------------------- Patient/Caregiver Education Details Patient Name: Date of Service: [name], TO [place] 8/16/2023andnbsp9:30 A M Medical Record Number: [id number] Patient Account Number: [id number] Date of Birth/Gender: Treating RN: 06/06/1976 (46 y.o. [name] Primary Care Physician: Dorinda Hill Other Clinician: Referring Physician: Treating Physician/Extender: Jana Hakim in Treatment: 12 Education Assessment Education Provided To: Patient Education Topics  Provided Wound/Skin Impairment: Methods: Explain/Verbal Responses: Return demonstration correctly Electronic Signature(s) Signed: 03/23/2022 5:21:55 PM By: [name] RN Entered By: [name] on 03/23/2022 16:54:59 -------------------------------------------------------------------------------- Wound Assessment Details Patient Name: Date of Service: [name], TO [name] 03/23/2022 9:30 A M Medical Record Number: [id number] Patient Account Number: [id number] Date of Birth/Sex: Treating RN: March 10, 1976 (46 y.o. [name] Primary Care Hosam Mcfetridge: Dorinda Hill Other Clinician: Referring Shery Wauneka: Treating Palak Tercero/Extender: Jana Hakim in Treatment: 50 Wound Status Wound Number: 2 Primary Etiology: Pressure Ulcer Wound Location: Left Calcaneus Wound Status: Open Wounding Event: Pressure Injury Comorbid History: Asthma, Angina, Hypertension Date Acquired: 10/07/2019 Weeks Of Treatment: 88 Clustered Wound: No Photos Wound Measurements Length: (cm) 1.2 Width: (cm) 0.2 Depth: (cm) 0.2 Area: (cm) 0.188 Volume: (cm) 0.038 % Reduction in Area: 99.3% % Reduction in Volume: 99.9% Epithelialization: Large (67-100%) Tunneling: No Undermining: No Wound Description Classification: Category/Stage III Wound Margin: Distinct, outline attached Exudate Amount: Medium Exudate Type: Serosanguineous Exudate Color: red, brown Foul Odor After Cleansing: No Slough/Fibrino Yes Wound Bed Granulation Amount: Medium (34-66%) Exposed Structure Granulation Quality: Red Fascia Exposed: No Necrotic Amount: Medium (34-66%) Fat Layer (Subcutaneous Tissue) Exposed: Yes Necrotic Quality: Eschar, Adherent Slough Tendon Exposed: No Muscle Exposed: No Joint Exposed: No Bone Exposed: No Treatment Notes Wound #2 (Calcaneus) Wound Laterality: Left Cleanser Normal Saline Discharge Instruction: Cleanse the wound with Normal Saline prior to applying a clean dressing  using gauze sponges, not tissue or cotton balls. Wound Cleanser Discharge Instruction: Cleanse the wound with wound cleanser prior to applying a clean dressing using gauze sponges, not tissue or cotton balls. Peri-Wound Care Zinc Oxide Ointment 30g tube Discharge Instruction: Apply Zinc Oxide to periwound with each dressing change Topical Primary Dressing KerraCel Ag Gelling Fiber Dressing, 4x5 in (silver alginate) Discharge Instruction: Apply silver alginate to wound bed as instructed Secondary Dressing Optifoam Non-Adhesive Dressing, 4x4 in Discharge Instruction: Apply over primary dressing as directed. Woven Gauze Sponge, Non-Sterile 4x4 in Discharge Instruction: Apply over primary dressing as directed. Secured With 57M Medipore H Soft Cloth Surgical T ape, 4 x 10 (in/yd) Discharge Instruction: Secure with tape as directed. Compression Wrap Compression Stockings Add-Ons Electronic Signature(s) Signed: 03/23/2022 5:21:55 PM By: [name] RN Signed: 03/23/2022 5:21:55 PM By: [name] RN Entered By: [name] on 03/23/2022 10:15:46 -------------------------------------------------------------------------------- Vitals Details Patient Name: Date of Service: [name], [name] 03/23/2022 9:30 A M Medical Record Number: [id number] Patient Account Number: [id number] Date of Birth/Sex: Treating RN: 02-Jan-1976 (46 y.o. [name] Primary Care Braydin Aloi: Dorinda Hill Other Clinician: Referring Jamaria Amborn: Treating Jaskaran Dauzat/Extender: Jana Hakim in Treatment: 51 Vital Signs Time Taken: 09:45 Temperature (F): 98.6 Height (in): 69 Pulse (bpm): 69 Weight (lbs): 280 Respiratory Rate (breaths/min): 18 Body Mass Index (BMI): 41.3 Blood Pressure (mmHg): 137/80 Reference Range: 80 - 120 mg / dl Electronic Signature(s) Signed: 03/23/2022 5:21:55 PM By: [name] RN Entered By: [name] on 03/23/2022 09:46:10

## 2022-03-24 NOTE — Telephone Encounter (Signed)
Prior Authorization submitted for AUVELITY 45-105 MG with Rx [employer], pending response.

## 2022-03-24 NOTE — Telephone Encounter (Signed)
Prior Approval received effective 03/24/2022-07/08/2022 for Auvelity 45-105 mg

## 2022-03-28 ENCOUNTER — Encounter (HOSPITAL_BASED_OUTPATIENT_CLINIC_OR_DEPARTMENT_OTHER): Payer: PPO | Admitting: General Surgery

## 2022-03-28 DIAGNOSIS — L97522 Non-pressure chronic ulcer of other part of left foot with fat layer exposed: Secondary | ICD-10-CM | POA: Diagnosis not present

## 2022-03-28 NOTE — Progress Notes (Signed)
[name], [name] ([id number]) Visit Report for 03/28/2022 Arrival Information Details Patient Name: Date of Service: [name], [place] 03/28/2022 11:00 A M Medical Record Number: [id number] Patient Account Number: [id number] Date of Birth/Sex: Treating RN: [birth year] (46 y.o. [name] Primary Care Erek Kowal: [name] Other Clinician: Referring Leida Luton: Treating Wissam Resor/Extender: [name] in Treatment: 37 Visit Information History Since Last Visit Added or deleted any medications: No Patient Arrived: Wheel Chair Any new allergies or adverse reactions: No Arrival Time: 10:36 Had a fall or experienced change in No Accompanied By: son activities of daily living that may affect Transfer Assistance: None risk of falls: Patient Identification Verified: Yes Signs or symptoms of abuse/neglect since last visito No Secondary Verification Process Completed: Yes Hospitalized since last visit: No Patient Requires Transmission-Based Precautions: No Implantable device outside of the clinic excluding No Patient Has Alerts: No cellular tissue based products placed in the center since last visit: Has Dressing in Place as Prescribed: Yes Has Footwear/Offloading in Place as Prescribed: Yes Left: T Contact Cast otal Pain Present Now: Yes Electronic Signature(s) Signed: 03/28/2022 5:48:43 PM By: Baruch Gouty RN, BSN Entered By: Baruch Gouty on 03/28/2022 10:38:13 -------------------------------------------------------------------------------- Encounter Discharge Information Details Patient Name: Date of Service: [name], [place] E. 03/28/2022 11:00 A M Medical Record Number: [id number] Patient Account Number: [id number] Date of Birth/Sex: Treating RN: 01/22/1976 (46 y.o. [name] Primary Care [name]: [name] Other Clinician: Referring Tru Leopard: Treating Rickey Farrier/Extender: [name] in Treatment: 54 Encounter  Discharge Information Items Discharge Condition: Stable Ambulatory Status: Wheelchair Discharge Destination: Home Transportation: Private Auto Accompanied By: son Schedule Follow-up Appointment: Yes Clinical Summary of Care: Patient Declined Electronic Signature(s) Signed: 03/28/2022 5:48:43 PM By: Baruch Gouty RN, BSN Entered By: Baruch Gouty on 03/28/2022 11:36:20 -------------------------------------------------------------------------------- Lower Extremity Assessment Details Patient Name: Date of Service: [name], [place] 03/28/2022 11:00 A M Medical Record Number: [id number] Patient Account Number: [id number] Date of Birth/Sex: Treating RN: 04/28/1976 (46 y.o. [name] Primary Care [name]: [name] Other Clinician: Referring Lillyn Wieczorek: Treating Dawn Convery/Extender: [name] in Treatment: 37 Edema Assessment Assessed: [name]: No] [Right: No] Edema: [Left: N] [Right: o] Calf Left: Right: Point of Measurement: 29 cm From Medial Instep 40.5 cm Ankle Left: Right: Point of Measurement: 9 cm From Medial Instep 25.5 cm Vascular Assessment Pulses: Dorsalis Pedis Palpable: [Left:Yes] Electronic Signature(s) Signed: 03/28/2022 5:48:43 PM By: Baruch Gouty RN, BSN Entered By: Baruch Gouty on 03/28/2022 10:54:24 -------------------------------------------------------------------------------- Multi Wound Chart Details Patient Name: Date of Service: [name], [place] 03/28/2022 11:00 A M Medical Record Number: [id number] Patient Account Number: [id number] Date of Birth/Sex: Treating RN: 1975/11/12 (46 y.o. M) Primary Care Keonta Alsip: [name] Other Clinician: Referring Tracen Mahler: Treating [name]/Extender: [name] in Treatment: 44 Vital Signs Height(in): 69 Pulse(bpm): 41 Weight(lbs): 280 Blood Pressure(mmHg): 168/72 Body Mass Index(BMI): 41.3 Temperature(F): 98.5 Respiratory  Rate(breaths/min): 18 Photos: [2:Left Calcaneus] [N/A:N/A N/A] Wound Location: [2:Pressure Injury] [N/A:N/A] Wounding Event: [2:Pressure Ulcer] [N/A:N/A] Primary Etiology: [2:Asthma, Angina, Hypertension] [N/A:N/A] Comorbid History: [2:10/07/2019] [N/A:N/A] Date Acquired: [2:88] [N/A:N/A] Weeks of Treatment: [2:Open] [N/A:N/A] Wound Status: [2:No] [N/A:N/A] Wound Recurrence: [2:2.3x1x0.2] [N/A:N/A] Measurements L x W x D (cm) [2:1.806] [N/A:N/A] A (cm) : rea [2:0.361] [N/A:N/A] Volume (cm) : [2:93.30%] [N/A:N/A] % Reduction in A rea: [2:98.70%] [N/A:N/A] % Reduction in Volume: [2:Category/Stage III] [N/A:N/A] Classification: [2:Medium] [N/A:N/A] Exudate A mount: [2:Serosanguineous] [N/A:N/A] Exudate Type: [2:red, brown] [N/A:N/A] Exudate Color: [2:Thickened] [N/A:N/A] Wound  Margin: [2:Large (67-100%)] [N/A:N/A] Granulation A mount: [2:Red] [N/A:N/A] Granulation Quality: [2:Small (1-33%)] [N/A:N/A] Necrotic A mount: [2:Fat Layer (Subcutaneous Tissue): Yes N/A] Exposed Structures: [2:Fascia: No Tendon: No Muscle: No Joint: No Bone: No Small (1-33%)] [N/A:N/A] Epithelialization: [2:T Contact Cast otal] [N/A:N/A] Treatment Notes Electronic Signature(s) Signed: 03/28/2022 11:15:20 AM By: [name] MD FACS Entered By: [name] on 03/28/2022 11:15:20 -------------------------------------------------------------------------------- Multi-Disciplinary Care Plan Details Patient Name: Date of Service: [name], [name] 03/28/2022 11:00 A M Medical Record Number: [id number] Patient Account Number: [id number] Date of Birth/Sex: Treating RN: 1975/12/08 (46 y.o. [name] Primary Care Miquan Tandon: Dorinda Hill Other Clinician: Referring Pami Wool: Treating Deavion Dobbs/Extender: Jana Hakim in Treatment: 3 Multidisciplinary Care Plan reviewed with physician Active Inactive Wound/Skin Impairment Nursing Diagnoses: Impaired tissue  integrity Knowledge deficit related to ulceration/compromised skin integrity Goals: Patient/caregiver will verbalize understanding of skin care regimen Date Initiated: 07/15/2020 Target Resolution Date: 07/07/2022 Goal Status: Active Ulcer/skin breakdown will have a volume reduction of 30% by week 4 Date Initiated: 07/15/2020 Date Inactivated: 08/20/2020 Target Resolution Date: 09/03/2020 Goal Status: Unmet Unmet Reason: no major changes. Ulcer/skin breakdown will heal within 14 weeks Date Initiated: 12/04/2020 Date Inactivated: 12/10/2020 Target Resolution Date: 12/10/2020 Unmet Reason: wounds still open at 14 Goal Status: Unmet weeks and today 21 weeks. Interventions: Assess patient/caregiver ability to obtain necessary supplies Assess patient/caregiver ability to perform ulcer/skin care regimen upon admission and as needed Assess ulceration(s) every visit Provide education on ulcer and skin care Treatment Activities: Skin care regimen initiated : 07/15/2020 Topical wound management initiated : 07/15/2020 Notes: Electronic Signature(s) Signed: 03/28/2022 5:48:43 PM By: Zenaida Deed RN, BSN Entered By: Zenaida Deed on 03/28/2022 11:05:59 -------------------------------------------------------------------------------- Pain Assessment Details Patient Name: Date of Service: [name], [name] 03/28/2022 11:00 A M Medical Record Number: [id number] Patient Account Number: [id number] Date of Birth/Sex: Treating RN: [birth year] (46 y.o. [name] Primary Care Troy Kanouse: Dorinda Hill Other Clinician: Referring Aurelio Mccamy: Treating Julliette Frentz/Extender: Jana Hakim in Treatment: 40 Active Problems Location of Pain Severity and Description of Pain Patient Has Paino Yes Site Locations Pain Location: Pain in Ulcers With Dressing Change: Yes Duration of the Pain. Constant / Intermittento Intermittent Rate the pain. Current Pain Level: 2 Character of  Pain Describe the Pain: Aching, Tender Pain Management and Medication Current Pain Management: Medication: Yes Is the Current Pain Management Adequate: Adequate Rest: Yes How does your wound impact your activities of daily livingo Sleep: No Bathing: No Appetite: No Relationship With Others: No Bladder Continence: No Emotions: No Bowel Continence: No Work: No Toileting: No Drive: No Dressing: No Hobbies: No Electronic Signature(s) Signed: 03/28/2022 5:48:43 PM By: Zenaida Deed RN, BSN Signed: 03/28/2022 5:48:43 PM By: Zenaida Deed RN, BSN Entered By: Zenaida Deed on 03/28/2022 10:48:28 -------------------------------------------------------------------------------- Patient/Caregiver Education Details Patient Name: Date of Service: [name], [name] 8/21/2023andnbsp11:00 A M Medical Record Number: [id number] Patient Account Number: [id number] Date of Birth/Gender: Treating RN: August 22, 1975 (46 y.o. [name] Primary Care Physician: Dorinda Hill Other Clinician: Referring Physician: Treating Physician/Extender: Jana Hakim in Treatment: 74 Education Assessment Education Provided To: Patient Education Topics Provided Offloading: Methods: Explain/Verbal Responses: Reinforcements needed, State content correctly Wound/Skin Impairment: Methods: Explain/Verbal Responses: Reinforcements needed, State content correctly Electronic Signature(s) Signed: 03/28/2022 5:48:43 PM By: Zenaida Deed RN, BSN Entered By: Zenaida Deed on 03/28/2022 11:06:21 -------------------------------------------------------------------------------- Wound Assessment Details Patient Name: Date of Service: [name], [name] 03/28/2022 11:00 A M Medical Record Number:  [id number] Patient Account Number: [id number] Date of Birth/Sex: Treating RN: 03/05/1976 (46 y.o. [name] Primary Care Jasai Sorg: Dorinda Hill Other Clinician: Referring  Wave Calzada: Treating Fardeen Steinberger/Extender: Jana Hakim in Treatment: 76 Wound Status Wound Number: 2 Primary Etiology: Pressure Ulcer Wound Location: Left Calcaneus Wound Status: Open Wounding Event: Pressure Injury Comorbid History: Asthma, Angina, Hypertension Date Acquired: 10/07/2019 Weeks Of Treatment: 88 Clustered Wound: No Photos Wound Measurements Length: (cm) 2.3 Width: (cm) 1 Depth: (cm) 0.2 Area: (cm) 1.806 Volume: (cm) 0.361 % Reduction in Area: 93.3% % Reduction in Volume: 98.7% Epithelialization: Small (1-33%) Tunneling: No Undermining: No Wound Description Classification: Category/Stage III Wound Margin: Thickened Exudate Amount: Medium Exudate Type: Serosanguineous Exudate Color: red, brown Foul Odor After Cleansing: No Slough/Fibrino No Wound Bed Granulation Amount: Large (67-100%) Exposed Structure Granulation Quality: Red Fascia Exposed: No Necrotic Amount: Small (1-33%) Fat Layer (Subcutaneous Tissue) Exposed: Yes Necrotic Quality: Adherent Slough Tendon Exposed: No Muscle Exposed: No Joint Exposed: No Bone Exposed: No Treatment Notes Wound #2 (Calcaneus) Wound Laterality: Left Cleanser Normal Saline Discharge Instruction: Cleanse the wound with Normal Saline prior to applying a clean dressing using gauze sponges, not tissue or cotton balls. Wound Cleanser Discharge Instruction: Cleanse the wound with wound cleanser prior to applying a clean dressing using gauze sponges, not tissue or cotton balls. Peri-Wound Care Topical Primary Dressing Endoform 2x2 in Discharge Instruction: Moisten with saline Secondary Dressing Optifoam Non-Adhesive Dressing, 4x4 in Discharge Instruction: Apply over primary dressing as directed. Woven Gauze Sponge, Non-Sterile 4x4 in Discharge Instruction: Apply over primary dressing as directed. Secured With 60M Medipore H Soft Cloth Surgical T ape, 4 x 10 (in/yd) Discharge Instruction: Secure  with tape as directed. Compression Wrap Compression Stockings Add-Ons Electronic Signature(s) Signed: 03/28/2022 5:48:43 PM By: Zenaida Deed RN, BSN Entered By: Zenaida Deed on 03/28/2022 11:03:28 -------------------------------------------------------------------------------- Vitals Details Patient Name: Date of Service: [name], [name] 03/28/2022 11:00 A M Medical Record Number: [id number] Patient Account Number: [id number] Date of Birth/Sex: Treating RN: 1975/11/23 (46 y.o. [name] Primary Care Dietrich Ke: Dorinda Hill Other Clinician: Referring Marche Hottenstein: Treating Nicosha Struve/Extender: Jana Hakim in Treatment: 47 Vital Signs Time Taken: 10:47 Temperature (F): 98.5 Height (in): 69 Pulse (bpm): 76 Weight (lbs): 280 Respiratory Rate (breaths/min): 18 Body Mass Index (BMI): 41.3 Blood Pressure (mmHg): 168/72 Reference Range: 80 - 120 mg / dl Electronic Signature(s) Signed: 03/28/2022 5:48:43 PM By: Zenaida Deed RN, BSN Entered By: Zenaida Deed on 03/28/2022 10:47:55

## 2022-03-28 NOTE — Progress Notes (Signed)
QASEM, RUDOLF ([id number]) Visit Report for 03/28/2022 Chief Complaint Document Details Patient Name: Date of Service: [name], [place] 03/28/2022 11:00 A M Medical Record Number: [id number] Patient Account Number: [id number] Date of Birth/Sex: Treating RN: 1975/09/05 (46 y.o. M) Primary Care Provider: [name] Other Clinician: Referring Provider: Treating Provider/Extender: [name] in Treatment: 74 Information Obtained from: Patient Chief Complaint Bilateral Plantar Foot Ulcers Electronic Signature(s) Signed: 03/28/2022 11:15:27 AM By: Fredirick Maudlin MD FACS Entered By: Fredirick Maudlin on 03/28/2022 11:15:27 -------------------------------------------------------------------------------- HPI Details Patient Name: Date of Service: [name], [place]. 03/28/2022 11:00 A M Medical Record Number: [id number] Patient Account Number: [id number] Date of Birth/Sex: Treating RN: Apr 29, 1976 (46 y.o. M) Primary Care Provider: [name] Other Clinician: Referring Provider: Treating Provider/Extender: [name] in Treatment: 88 History of Present Illness HPI Description: Wounds are12/03/2020 upon evaluation today patient presents for initial inspection here in our clinic concerning issues he has been having with the bottoms of his feet bilaterally. He states these actually occurred as wounds when he was hospitalized for 5 months secondary to Covid. He was apparently with tilting bed where he was in an upright position quite frequently and apparently this occurred in some way shape or form during that time. Fortunately there is no sign of active infection at this time. No fevers, chills, nausea, vomiting, or diarrhea. With that being said he still has substantial wounds on the plantar aspects of his feet Theragen require quite a bit of work to get these to heal. He has been using Santyl currently though that is been problematic both in  receiving the medication as well as actually paid for it as it is become quite expensive. Prior to the experience with Covid the patient really did not have any major medical problems other than hypertension he does have some mild generalized weakness following the Covid experience. 07/22/2020 on evaluation today patient appears to be doing okay in regard to his foot ulcers I feel like the wound beds are showing signs of better improvement that I do believe the Iodoflex is helping in this regard. With that being said he does have a lot of drainage currently and this is somewhat blue/green in nature which is consistent with Pseudomonas. I do think a culture today would be appropriate for [place] to evaluate and see if that is indeed the case I would likely start him on antibiotic orally as well he is not allergic to Cipro knows of no issues he has had in the past 12/21; patient was admitted to the clinic earlier this month with bilateral presumed pressure ulcers on the bottom of his feet apparently related to excessive pressure from a tilt table arrangement in the intensive care unit. Patient relates this to being on ECMO but I am not really sure that is exactly related to that. I must say I have never seen anything like this. He has fairly extensive full-thickness wounds extending from his heel towards his midfoot mostly centered laterally. There is already been some healing distally. He does not appear to have an arterial issue. He has been using gentamicin to the wound surfaces with Iodoflex to help with ongoing debridement 1/6; this is a patient with pressure ulcers on the bottom of his feet related to excessive pressure from a standing position in the intensive care unit. He is complaining of a lot of pain in the right heel. He is not a diabetic. 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 We have been using Iodoflex to help prepare the surfaces of both wounds for an advanced  treatment product. He is nonambulatory spending most of his time in a wheelchair I have asked him not to propel the wheelchair with his heels 1/13; in general his wounds look better not much surface area change we have been using Iodoflex as of last week. I did an x-ray of the right heel as the patient was complaining of pain. I had some thoughts about a stress fracture perhaps Achilles tendon problems however what it showed was erosive changes along the inferior aspect of the calcaneus he now has a MRI booked for 1/20. 1/20; in general his wounds continue to be better. Some improvement in the large narrow areas proximally in his foot. He is still complaining of pain in the right heel and tenderness in certain areas of this wound. His MRI is tonight. I am not just looking for osteomyelitis that was brought up on the x-ray I am wondering about stress fractures, tendon ruptures etc. He has no such findings on the left. Also noteworthy is that the patient had critical illness neuropathy and some of the discomfort may be actual improvement in nerve function I am just not sure. These wounds were initially in the setting of severe critical illness related to COVID-19. He was put in a standing position. He may have also been on pressors at the point contributing to tissue ischemia. By his description at some point these wounds were grossly necrotic extending proximally up into the Achilles part of his heel. I do not know that I have ever really seen pictures of them like this although they may exist in epic We have ordered Tri layer Oasis. I am trying to stimulate some granulation in these areas. This is of course assuming the MRI is negative for infection 1/27; since the patient was last here he saw Dr. Juleen China of infectious disease. He is planned for vancomycin and ceftriaxone. Prior operative culture grew MSSA. Also ordered baseline lab work. He also ordered arterial studies although the ABIs in our  clinic were normal as well as his clinical exam these were normal I do not think he needs to see vascular surgery. His ABIs at the PTA were 1.22 in the right triphasic waveforms with a normal TBI of 1.15 on the left ABI of 1.22 with triphasic waveforms and a normal TBI of 1.08. Finally he saw Dr. Amalia Hailey who will follow him in for 2 months. At this point I do not think he felt that he needed a procedure on the right calcaneal bone. Dr. Juleen China is elected for broad-spectrum antibiotic The patient is still having pain in the right heel. He walks with a walker 2/3; wounds are generally smaller. He is tolerating his IV antibiotics. I believe this is vancomycin and ceftriaxone. We are still waiting for Oasis burn in terms of his out-of-pocket max which he should be meeting soon given the IV antibiotics, MRIs etc. I have asked him to check in on this. We are using silver collagen in the meantime the wounds look better 2/10; tolerating IV vancomycin and Rocephin. We are waiting to apply for Oasis. Although I am not really sure where he is in his out-of-pocket max. 2/17 started the first application of Oasis trilayer. Still on antibiotics. The wounds have generally look better. The area on the left has a little more surface slough requiring debridement 8/18; second application of Oasis trilayer. The wound surface  granulation is generally look better. The area on the left with undermining laterally I think is come in a bit. 10/08/2020 upon evaluation today patient is here today for [employer] application #3. Fortunately he seems to be doing extremely well with regard to this and we are seeing a lot of new epithelial growth which is great news. Fortunately there is no signs of active infection at this time. 10/16/2020 upon evaluation today patient appears to be doing well with regard to his foot ulcers. Do believe the Oasis has been of benefit for him. I do not see any signs of infection right now which is  great news and I think that he has a lot of new epithelial growth which is great to see as well. The patient is very pleased to hear all of this. I do think we can proceed with the Oasis trilayer #4 today. 3/18; not as much improvement in these areas on his heels that I was hoping. I did reapply trilateral Oasis today the tissue looks healthier but not as much fill in as I was hoping. 3/25; better looking today I think this is come in a bit the tissue looks healthier. Triple layer Oasis reapplied #6 4/1; somewhat better looking definitely better looking surface not as much change in surface area as I was hoping. He may be spending more time [place] on days then he needs to although he does have heel offloading boots. Triple layer Oasis reapplied #7 4/7; unfortunately apparently [facility] will not approve any further Oasis which is unfortunate since the patient did respond nicely both in terms of the condition of the wound bed as well as surface area. There is still some drainage coming from the wound but not a lot there does not appear to be any infection 4/15; we have been using Hydrofera Blue. He continues to have nice rims of epithelialization on the right greater than the left. The left the epithelialization is coming from the tip of his heel. There is moderate drainage. In this that concerns me about a total contact cast. There is no evidence of infection 4/29; patient has been using Hydrofera Blue with dressing changes. He has no complaints or issues today. 5/5; using Hydrofera Blue. I actually think that he looks marginally better than the last time I saw this 3 weeks ago. There are rims of epithelialization on the left thumb coming from the medial side on the right. Using Hydrofera Blue 5/12; using Hydrofera Blue. These continue to make improvements in surface area. His drainage was not listed as severe I therefore went ahead and put a cast on the left foot. Right foot we will  continue to dress his previous 5/16; back for first total contact cast change. He did not tolerate this particularly well cast injury on the anterior tibia among other issues. Difficulty sleeping. I talked him about this in some detail and afterwards is elected to continue. I told him I would like to have a cast on for 3 weeks to see if this is going to help at all. I think he agreed 5/19; I think the wound is better. There is no tunneling towards his midfoot. The undermining medially also looks better. He has a rim of new skin distally. I think we are making progress here. The area on the left also continues to look somewhat better to me using Hydrofera Blue. He has a list of complaints about the cast but none of them seem serious 5/26; patient  presents for 1 week follow-up. He has been using a total contact cast and tolerating this well. Hydrofera Blue is the main dressing used. He denies signs of infection. 6/2 Hydrofera Blue total contact cast on the left. These were large ulcers that formed in intensive care unit where the patient was recovering from [employer]. May have had something to do with being ventilated in an upright positiono Pressors etc. We have been able to get the areas down considerably and a viable surface. There is some epithelialization in both sides. Note made of drainage 6/9; changed to [facility] last time because of drainage. He arrives with better looking surfaces and dimensions on the left than the right. Paradoxically the right actually probes more towards his midfoot the left is largely close down but both of these look improved. Using a total contact cast on the left 6/16; complex wounds on his bilateral plantar heels which were initially pressure injury from a stay in the ICU with COVID. We have been using silver alginate most recently. His dimensions of come in quite dramatically however not recently. We have been putting the left foot in a total contact cast 6/23;  complex wounds on the bilateral plantar heels. I been putting the left in the cast paradoxically the area on the right is the one that is going towards closure at a faster rate. Quite a bit of drainage on the left. The patient went to see Dr. Amalia Hailey who said he was going to standby for skin grafts. I had actually considered sending him for skin grafts however he would be mandatorily off his feet for a period of weeks to months. I am thinking that the area on the right is going to close on its own the area on the left has been more stubborn even though we have him in a total contact cast 6/30; took him out of a total contact cast last week is the right heel seem to be making better progress than the left where I was placing the cast. We are using silver alginate. Both wounds are smaller right greater than left 7/12; both wounds look as though they are making some progress. We are using silver alginate. Heel offloading boots 7/26; very gradual progress especially on the right. Using silver alginate. He is wearing heel offloading boots 8/18; he continues to close these wounds down very gradually. Using silver alginate. The problem polymen being definitive about this is areas of what appears to be callus around the margins. This is not a 100% of the area but certainly sizable especially on the right 9/1; bilateral plantar feet wounds secondary to prolonged pressure while being ventilated for COVID-19 in an upright position. Essentially pressure ulcers on the bottom of his feet. He is made substantial progress using silver alginate. 9/14; bilateral plantar feet wounds secondary to prolonged pressure. Making progress using silver alginate. 9/29 bilateral plantar feet wounds secondary to prolonged pressure. I changed him to Iodoflex last week. MolecuLight showing reddened blush fluorescence 10/11; patient presents for follow-up. He has no issues or complaints today. He denies signs of infection. He continues  to use Iodoflex and antibiotic ointment to the wound beds. 10/27; 2-week follow-up. No evidence of infection. He has callus and thick dry skin around the wound margins we have been using Iodoflex and Bactroban which was in response to a moderate left MolecuLight reddish blush fluorescence. 11/10; 2-week follow-up. Wound margins again have thick callus however the measurements of the actual wound sites are a lot  smaller. Everything looks reasonably healthy here. We have been using Iodoflex He was approved for prime matrix but I have elected to delay this given the improvement in the surface area. Hopefully I will not regret that decision as were getting close to the end of the year in terms of insurance payment 12/8; 2-week follow-up. Wounds are generally smaller in size. These were initially substantial wounds extending into the forefoot all the way into the heel on the bilateral plantar feet. They are now both located on the plantar heel distal aspect both of these have a lot of callus around the wounds I used a #5 curette to remove this on the right and the left also some subcutaneous debris to try and get the wound edges were using Iodoflex. He has heel offloading shoe 12/22; 2-week follow-up. Not really much improvement. He has thick callus around the outer edges of both wounds. I remove this there is some nonviable subcutaneous tissue as well. We have been using Iodoflex. Her intake nurse and myself spontaneously thought of a total contact cast I went back in May. At that time we really were not seeing much of an improvement with a cast although the wound was in a much different situation I would like to retry this in 2 weeks and I discussed this with the patient 08/12/2021; the patient has had some improvement with the Iodoflex. The the area on the left heel plantar more improved than the right. I had to put him in a total contact cast on the left although I decided to put that off for 2 weeks. I  am going to change his primary dressing to silver collagen. I think in both areas he has had some improvement most of the healing seems to be more proximal in the heel. The wounds are in the mid aspect. A lot of thick callus on the right heel however. 1/19; we are using silver collagen on both plantar heel areas. He has had some improvement today. The left did not require any debridement. He still had some eschar on the right that was debrided but both seem to have contracted. I did not put it total contact cast on him today 2/2 we have been using silver collagen. The area on the right plantar heel has areas that appear to be epithelialized interspersed with dry flaking callus and dry skin. I removed this. This really looks better than on the other side. On the left still a large area with raised edges and debris on the surface. 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 He has no issues or complaints today. 2/9; not much change in the left foot wound with 1 week of a cast we are using silver collagen. Silver collagen on the right side. The right side has been the better wound surface. We will reapply the total contact cast on the left 2/16; not much improvement on either side I been using silver collagen with a total contact cast on the left. I'm changing the Hydrofera Blue still with a total contact cast on the left 2/23; some improvement on both sides. Disappointing that he has thick callus around the area that we are putting in a total contact cast on the left. We've been using Hydrofera Blue on both wound areas. This is a man who at essentially pressure ulcers in addition to ischemia caused  by medications to support his blood pressure (pressors) in the ICU. He was being ventilated in the standing position for severe Covid. A Shiley the wounds extended across his  entire foot but are now localized to his plantar heels bilaterally. We have made progress however neither areas healed. I continue to think the total contact cast is helped albeit painstakingly slowly. He has never wanted a plastic surgery consult although I don't know that they would be interested in grafting in area in this location. 10/07/2021: Continued improvement bilaterally. There is still some callus around the left wound, despite the total contact cast. He has some increased pain in his right midfoot around 1 particular area. This has been painful in the past but seems to be a little bit worse. When his cast was removed today, there was an area on the heel of the left foot that looks a bit macerated. He is also complaining of pain in his left thigh and hip which he thinks is secondary to the limb length discrepancy caused by the cast. 10/14/2021: He continues to improve. A little bit less callus around the left wound. He continues to endorse pain in his right midfoot, but this is not as significant as it was last week. The maceration on his left heel is improved. 10/21/2021: Continued improvement to both wounds. The maceration on his left heel is no longer evident. Less callus bilaterally. Epithelialization progressing. 10/28/2021: Significant improvement this week. The right sided wound is nearly closed with just a small open area at the middle. No maceration seen on the left heel. Continued epithelialization on both sides. No concern for infection. 11/04/2021: T oday, the wounds were measured a little bit differently and come out as larger, but I actually think they are about the same to potentially even smaller, particularly on the left. He continues to accumulate some callus on the right. 11/11/2021: T oday, the patient is expressing some concern that the left wound, despite being in the total contact cast, is not progressing at the same rate as the 1 on the right. He is interested in trying a  week without the cast to see how the wound does. The wounds are roughly the same size as last week, with the right perhaps being a little bit smaller. He continues to build up callus on both sites. 11/18/2021: Last week, I permitted the patient to go without his total contact cast, just to see if the cast was really making any difference. Today, both wounds have deteriorated to some extent, suggesting that the cast is providing benefit, at least on the left. Both are larger and have accumulated callus, slough, and other debris. 11/26/2021: I debrided both wounds quite aggressively last week in an effort to stimulate the healing cascade. This appears to have been effective as the left sided wound is a full centimeter shorter in length. Although the right was measured slightly larger, on inspection, it looks as though an area of epithelialized tissue was included in the measurements. We have been using PolyMem Ag on the wound surfaces with a total contact cast to the left. 12/02/2021: It appears that the intake personnel are including epithelialized tissue in his wound measurements; the right wound is almost completely epithelialized; there is just a crater at the proximal midfoot with some open areas. On the left, he has built up some callus, but the overall wound surface looks good. There is some senescent skin around the wound margin. He has been in PolyMem Ag bilaterally with  a total contact cast on the left. 12/09/2021: The right wound is nearly closed; there is just a small open area at the mid calcaneus. On the left, the wound is smaller with minimal callus buildup. No significant drainage. 12/16/2021: The right calcaneal wound remains minimally open at the mid calcaneus; the rest has epithelialized. On the left, the wound is also a little bit smaller. There is some senescent tissue on the periphery. He is getting his first application of a trial skin substitute called Vendaje today. 12/23/2021: The  wound on his right calcaneus is nearly closed; there is just a small area at the most distal aspect of the calcaneus that is open. On the left, the area where we applied to the skin substitute has a healthier-looking bed of granulation tissue. The wound dimensions are not significantly different on this side but the wound surface is improved. 12/30/2021: The wound on the right calcaneus has not changed significantly aside from some accumulation of callus. On the left, the open area is smaller and continues to have an improved surface. He continues to accumulate callus around the wound. He is here for his third application of Vendaje. 01/06/2022: The right calcaneal wound is down to just a couple of millimeters. He continues to accumulate periwound callus. He unfortunately got his cast wet earlier in the week and his left foot is macerated, resulting in some superficial skin loss just distal to the open wound. The open wound itself, however, is much smaller and has a healthier appearing surface. He is here for his fourth application of Vendaje. 01/13/2022: The right calcaneal wound is about the same. Unfortunately, once again, his cast got wet and his foot is again macerated. This is caused the left calcaneal wound to enlarge. He is here for his fifth application of Vendaje. 01/20/2022: The right calcaneal wound is very small. There is some periwound callus accumulation. He purchased a new cast protector last week and this has been effective in avoiding the maceration that has been occurring on the left. The left calcaneal wound is narrower and has a healthy and viable-appearing surface. He is here for his 6 application of Vendaje. 01/27/2022: The right calcaneal wound is down to just a pinhole. There is some periwound slough and callus. On the left, the wound is narrower and shorter by about a centimeter. The surface is robust and viable-appearing. Unfortunately, the rep for the trial skin substitute product  did not provide any for [place] to use today. 02/04/2022: The right calcaneal wound remains unchanged. There is more accumulated callus. On the left, although the intake nurse measured it a little bit longer, it looks about the same to me. The surface has a layer of slough, but underneath this, there is good granulation tissue. 02/10/2022: The right calcaneus wound is nearly closed. There is still some callus that builds up around the site. The left side looks about the same in terms of dimensions, but the surface is more robust and vital-appearing. 02/16/2022: The area of the right calcaneus that was nearly closed last week has closed, but there is a small opening at the mid foot where it looks like some moisture got retained and caused some reopening. The left foot wound is narrower and shallower. Both sites have a fair amount of periwound callus and eschar. 02/24/2022: The small midfoot opening on the right calcaneus is a little bit smaller today. The left foot wound is narrower and shallower. He continues to accumulate periwound callus. No concern for infection.  03/01/2022: The patient came to clinic early because he showered and got his cast wet. Fortunately, there is no significant maceration to his foot but the callus softened and it looks like the wound on his left calcaneus may be a little bit wider. The wound on his right calcaneus is just a narrow slit. Continued accumulation of periwound callus bilaterally. 03/08/2022: The wound on his right calcaneus is very nearly closed, just a small pinpoint opening under a bit of eschar; the left wound has come in quite a bit, as well. It is narrower and shorter than at our last visit. Still with accumulated callus and eschar bilaterally. 03/17/2022: The right calcaneal wound is healed. The left wound is smaller and the surface itself is very clean, but there is some blue-green staining on the periwound callus, concerning for Pseudomonas aeruginosa. 03/23/2022:  The right calcaneal wound remains closed. The left wound continues to contract. No further blue-green staining. Small amount of callus and slough accumulation. 03/28/2022: He came in early today because he had gotten his cast wet. On inspection, the wound itself did not get wet or macerated, just a little bit of the forefoot. The wound itself is basically unchanged. Electronic Signature(s) Signed: 03/28/2022 11:17:25 AM By: [name] MD FACS Entered By: [name] on 03/28/2022 11:17:25 -------------------------------------------------------------------------------- Physical Exam Details Patient Name: Date of Service: [name], [name] 03/28/2022 11:00 A M Medical Record Number: [id number] Patient Account Number: [id number] Date of Birth/Sex: Treating RN: Mar 20, 1976 (46 y.o. M) Primary Care Provider: Dorinda Hill Other Clinician: Referring Provider: Treating Provider/Extender: Jana Hakim in Treatment: 78 Constitutional Hypertensive, asymptomatic. . . . No acute distress.Marland Kitchen Respiratory Normal work of breathing on room air.. Notes 03/28/2022: He came in today because his cast got wet, but fortunately the wound was unaffected. There is just some maceration of his forefoot. No change to his wound. Electronic Signature(s) Signed: 03/28/2022 11:20:17 AM By: [name] MD FACS Entered By: [name] on 03/28/2022 11:20:17 -------------------------------------------------------------------------------- Physician Orders Details Patient Name: Date of Service: [name], [name] 03/28/2022 11:00 A M Medical Record Number: [id number] Patient Account Number: [id number] Date of Birth/Sex: Treating RN: 09-20-1975 (46 y.o. [name] Primary Care Provider: Dorinda Hill Other Clinician: Referring Provider: Treating Provider/Extender: Jana Hakim in Treatment: 55 Verbal / Phone Orders: No Diagnosis Coding ICD-10 Coding Code  Description [phone] Non-pressure chronic ulcer of other part of left foot with fat layer exposed Follow-up Appointments ppointment in 1 week. - Dr Lady Gary - Room 3 Return A Thurs. 8/31 @ 09:30 am Anesthetic Wound #2 Left Calcaneus (In clinic) Topical Lidocaine 4% applied to wound bed Bathing/ Shower/ Hygiene May shower with protection but do not get wound dressing(s) wet. - Cover with cast protector (can purchase cast protector at [facility] or [facility] ) Edema Control - Lymphedema / SCD / Other Bilateral Lower Extremities Elevate legs to the level of the heart or above for 30 minutes daily and/or when sitting, a frequency of: - throughout the day Avoid standing for long periods of time. Moisturize legs daily. - right leg and foot every night. Off-Loading Total Contact Cast to Left Lower Extremity Other: - keep pressure off of the bottom of your feet Additional Orders / Instructions Follow Nutritious Diet Wound Treatment Wound #2 - Calcaneus Wound Laterality: Left Cleanser: Normal Saline (Generic) 1 x Per Week/30 Days Discharge Instructions: Cleanse the wound with Normal Saline prior to applying a clean dressing using gauze sponges, not tissue  or cotton balls. Cleanser: Wound Cleanser 1 x Per Week/30 Days Discharge Instructions: Cleanse the wound with wound cleanser prior to applying a clean dressing using gauze sponges, not tissue or cotton balls. Prim Dressing: Endoform 2x2 in 1 x Per Week/30 Days ary Discharge Instructions: Moisten with saline Secondary Dressing: Optifoam Non-Adhesive Dressing, 4x4 in (Generic) 1 x Per Week/30 Days Discharge Instructions: Apply over primary dressing as directed. Secondary Dressing: Woven Gauze Sponge, Non-Sterile 4x4 in 1 x Per Week/30 Days Discharge Instructions: Apply over primary dressing as directed. Secured With: 80M Medipore H Soft Cloth Surgical T ape, 4 x 10 (in/yd) (Generic) 1 x Per Week/30 Days Discharge Instructions: Secure with tape as  directed. Patient Medications llergies: No Known Drug Allergies A Notifications Medication Indication Start End prior to debridement 03/28/2022 lidocaine DOSE topical 4 % cream - cream topical Electronic Signature(s) Signed: 03/28/2022 11:33:20 AM By: Fredirick Maudlin MD FACS Entered By: Fredirick Maudlin on 03/28/2022 11:20:26 -------------------------------------------------------------------------------- Problem List Details Patient Name: Date of Service: [name], [place] 03/28/2022 11:00 A M Medical Record Number: [id number] Patient Account Number: [id number] Date of Birth/Sex: Treating RN: January 28, 1976 (46 y.o. [name] Primary Care Provider: [name] Other Clinician: Referring Provider: Treating Provider/Extender: [name] in Treatment: 66 Active Problems ICD-10 Encounter Code Description Active Date MDM Diagnosis L97.522 Non-pressure chronic ulcer of other part of left foot with fat layer exposed 09/03/2020 No Yes Inactive Problems ICD-10 Code Description Active Date Inactive Date L97.512 Non-pressure chronic ulcer of other part of right foot with fat layer exposed 09/03/2020 09/03/2020 L89.893 Pressure ulcer of other site, stage 3 07/15/2020 07/15/2020 M62.81 Muscle weakness (generalized) 07/15/2020 07/15/2020 I10 Essential (primary) hypertension 07/15/2020 07/15/2020 M86.171 Other acute osteomyelitis, right ankle and foot 09/03/2020 09/03/2020 Resolved Problems Electronic Signature(s) Signed: 03/28/2022 11:15:15 AM By: Fredirick Maudlin MD FACS Entered By: Fredirick Maudlin on 03/28/2022 11:15:15 -------------------------------------------------------------------------------- Progress Note Details Patient Name: Date of Service: [name], [place] 03/28/2022 11:00 A M Medical Record Number: [id number] Patient Account Number: [id number] Date of Birth/Sex: Treating RN: [birth year] (46 y.o. M) Primary Care Provider: [name] Other  Clinician: Referring Provider: Treating Provider/Extender: [name] in Treatment: 5 Subjective Chief Complaint Information obtained from Patient Bilateral Plantar Foot Ulcers History of Present Illness (HPI) Wounds are12/03/2020 upon evaluation today patient presents for initial inspection here in our clinic concerning issues he has been having with the bottoms of his feet bilaterally. He states these actually occurred as wounds when he was hospitalized for 5 months secondary to Covid. He was apparently with tilting bed where he was in an upright position quite frequently and apparently this occurred in some way shape or form during that time. Fortunately there is no sign of active infection at this time. No fevers, chills, nausea, vomiting, or diarrhea. With that being said he still has substantial wounds on the plantar aspects of his feet Theragen require quite a bit of work to get these to heal. He has been using Santyl currently though that is been problematic both in receiving the medication as well as actually paid for it as it is become quite expensive. Prior to the experience with Covid the patient really did not have any major medical problems other than hypertension he does have some mild generalized weakness following the Covid experience. 07/22/2020 on evaluation today patient appears to be doing okay in regard to his foot ulcers I feel like the wound beds are showing signs of better improvement that  I do believe the Iodoflex is helping in this regard. With that being said he does have a lot of drainage currently and this is somewhat blue/green in nature which is consistent with Pseudomonas. I do think a culture today would be appropriate for [place] to evaluate and see if that is indeed the case I would likely start him on antibiotic orally as well he is not allergic to Cipro knows of no issues he has had in the past 12/21; patient was admitted to the clinic  earlier this month with bilateral presumed pressure ulcers on the bottom of his feet apparently related to excessive pressure from a tilt table arrangement in the intensive care unit. Patient relates this to being on ECMO but I am not really sure that is exactly related to that. I must say I have never seen anything like this. He has fairly extensive full-thickness wounds extending from his heel towards his midfoot mostly centered laterally. There is already been some healing distally. He does not appear to have an arterial issue. He has been using gentamicin to the wound surfaces with Iodoflex to help with ongoing debridement 1/6; this is a patient with pressure ulcers on the bottom of his feet related to excessive pressure from a standing position in the intensive care unit. He is complaining of a lot of pain in the right heel. He is not a diabetic. 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 We have been using Iodoflex to help prepare the surfaces of both wounds for an advanced treatment product. He is nonambulatory spending most of his time in a wheelchair I have asked him not to propel the wheelchair with his heels 1/13; in general his wounds look better not much surface area change we have been using Iodoflex as of last week. I did an x-ray of the right heel as the patient was complaining of pain. I had some thoughts about a stress fracture perhaps Achilles tendon problems however what it showed was erosive changes along the inferior aspect of the calcaneus he now has a MRI booked for 1/20. 1/20; in general his wounds continue to be better. Some improvement in the large narrow areas proximally in his foot. He is still complaining of pain in the right heel and tenderness in certain areas of this wound. His MRI is tonight. I am not just looking for osteomyelitis that was brought up on the x-ray I am wondering about stress fractures, tendon ruptures etc. He has no such findings  on the left. Also noteworthy is that the patient had critical illness neuropathy and some of the discomfort may be actual improvement in nerve function I am just not sure. These wounds were initially in the setting of severe critical illness related to COVID-19. He was put in a standing position. He may have also been on pressors at the point contributing to tissue ischemia. By his description at some point these wounds were grossly necrotic extending proximally up into the Achilles part of his heel. I do not know that I have ever really seen pictures of them like this although they may exist in epic We have ordered Tri layer Oasis. I am trying to stimulate some granulation in these areas. This is of course assuming the MRI is negative for infection 1/27; since the patient was last here he saw Dr. Earlene Plater of infectious disease. He is planned for vancomycin and ceftriaxone. Prior operative culture grew MSSA. Also ordered baseline lab work. He also ordered arterial  studies although the ABIs in our clinic were normal as well as his clinical exam these were normal I do not think he needs to see vascular surgery. His ABIs at the [facility] were 1.22 in the right triphasic waveforms with a normal TBI of 1.15 on the left ABI of 1.22 with triphasic waveforms and a normal TBI of 1.08. Finally he saw Dr. Amalia Hailey who will follow him in for 2 months. At this point I do not think he felt that he needed a procedure on the right calcaneal bone. Dr. Juleen China is elected for broad-spectrum antibiotic The patient is still having pain in the right heel. He walks with a walker 2/3; wounds are generally smaller. He is tolerating his IV antibiotics. I believe this is vancomycin and ceftriaxone. We are still waiting for Oasis burn in terms of his out-of-pocket max which he should be meeting soon given the IV antibiotics, MRIs etc. I have asked him to check in on this. We are using silver collagen in the meantime the wounds look  better 2/10; tolerating IV vancomycin and Rocephin. We are waiting to apply for Oasis. Although I am not really sure where he is in his out-of-pocket max. 2/17 started the first application of Oasis trilayer. Still on antibiotics. The wounds have generally look better. The area on the left has a little more surface slough requiring debridement [id number]; second application of Oasis trilayer. The wound surface granulation is generally look better. The area on the left with undermining laterally I think is come in a bit. 10/08/2020 upon evaluation today patient is here today for [employer] application #3. Fortunately he seems to be doing extremely well with regard to this and we are seeing a lot of new epithelial growth which is great news. Fortunately there is no signs of active infection at this time. 10/16/2020 upon evaluation today patient appears to be doing well with regard to his foot ulcers. Do believe the Oasis has been of benefit for him. I do not see any signs of infection right now which is great news and I think that he has a lot of new epithelial growth which is great to see as well. The patient is very pleased to hear all of this. I do think we can proceed with the Oasis trilayer #4 today. 3/18; not as much improvement in these areas on his heels that I was hoping. I did reapply trilateral Oasis today the tissue looks healthier but not as much fill in as I was hoping. 3/25; better looking today I think this is come in a bit the tissue looks healthier. Triple layer Oasis reapplied #6 4/1; somewhat better looking definitely better looking surface not as much change in surface area as I was hoping. He may be spending more time [place] on days then he needs to although he does have heel offloading boots. Triple layer Oasis reapplied #7 4/7; unfortunately apparently [name] will not approve any further Oasis which is unfortunate since the patient did respond nicely both in terms of  the condition of the wound bed as well as surface area. There is still some drainage coming from the wound but not a lot there does not appear to be any infection 4/15; we have been using Hydrofera Blue. He continues to have nice rims of epithelialization on the right greater than the left. The left the epithelialization is coming from the tip of his heel. There is moderate drainage. In this that concerns me about a  total contact cast. There is no evidence of infection 4/29; patient has been using Hydrofera Blue with dressing changes. He has no complaints or issues today. 5/5; using Hydrofera Blue. I actually think that he looks marginally better than the last time I saw this 3 weeks ago. There are rims of epithelialization on the left thumb coming from the medial side on the right. Using Hydrofera Blue 5/12; using Hydrofera Blue. These continue to make improvements in surface area. His drainage was not listed as severe I therefore went ahead and put a cast on the left foot. Right foot we will continue to dress his previous 5/16; back for first total contact cast change. He did not tolerate this particularly well cast injury on the anterior tibia among other issues. Difficulty sleeping. I talked him about this in some detail and afterwards is elected to continue. I told him I would like to have a cast on for 3 weeks to see if this is going to help at all. I think he agreed 5/19; I think the wound is better. There is no tunneling towards his midfoot. The undermining medially also looks better. He has a rim of new skin distally. I think we are making progress here. The area on the left also continues to look somewhat better to me using Hydrofera Blue. He has a list of complaints about the cast but none of them seem serious 5/26; patient presents for 1 week follow-up. He has been using a total contact cast and tolerating this well. Hydrofera Blue is the main dressing used. He denies signs of  infection. 6/2 Hydrofera Blue total contact cast on the left. These were large ulcers that formed in intensive care unit where the patient was recovering from COVID. May have had something to do with being ventilated in an upright positiono Pressors etc. We have been able to get the areas down considerably and a viable surface. There is some epithelialization in both sides. Note made of drainage 6/9; changed to [facility] last time because of drainage. He arrives with better looking surfaces and dimensions on the left than the right. Paradoxically the right actually probes more towards his midfoot the left is largely close down but both of these look improved. Using a total contact cast on the left 6/16; complex wounds on his bilateral plantar heels which were initially pressure injury from a stay in the ICU with COVID. We have been using silver alginate most recently. His dimensions of come in quite dramatically however not recently. We have been putting the left foot in a total contact cast 6/23; complex wounds on the bilateral plantar heels. I been putting the left in the cast paradoxically the area on the right is the one that is going towards closure at a faster rate. Quite a bit of drainage on the left. The patient went to see Dr. Logan Bores who said he was going to standby for skin grafts. I had actually considered sending him for skin grafts however he would be mandatorily off his feet for a period of weeks to months. I am thinking that the area on the right is going to close on its own the area on the left has been more stubborn even though we have him in a total contact cast 6/30; took him out of a total contact cast last week is the right heel seem to be making better progress than the left where I was placing the cast. We are using silver alginate. Both wounds are  smaller right greater than left 7/12; both wounds look as though they are making some progress. We are using silver alginate.  Heel offloading boots 7/26; very gradual progress especially on the right. Using silver alginate. He is wearing heel offloading boots 8/18; he continues to close these wounds down very gradually. Using silver alginate. The problem polymen being definitive about this is areas of what appears to be callus around the margins. This is not a 100% of the area but certainly sizable especially on the right 9/1; bilateral plantar feet wounds secondary to prolonged pressure while being ventilated for COVID-19 in an upright position. Essentially pressure ulcers on the bottom of his feet. He is made substantial progress using silver alginate. 9/14; bilateral plantar feet wounds secondary to prolonged pressure. Making progress using silver alginate. 9/29 bilateral plantar feet wounds secondary to prolonged pressure. I changed him to Iodoflex last week. MolecuLight showing reddened blush fluorescence 10/11; patient presents for follow-up. He has no issues or complaints today. He denies signs of infection. He continues to use Iodoflex and antibiotic ointment to the wound beds. 10/27; 2-week follow-up. No evidence of infection. He has callus and thick dry skin around the wound margins we have been using Iodoflex and Bactroban which was in response to a moderate left MolecuLight reddish blush fluorescence. 11/10; 2-week follow-up. Wound margins again have thick callus however the measurements of the actual wound sites are a lot smaller. Everything looks reasonably healthy here. We have been using Iodoflex He was approved for prime matrix but I have elected to delay this given the improvement in the surface area. Hopefully I will not regret that decision as were getting close to the end of the year in terms of insurance payment 12/8; 2-week follow-up. Wounds are generally smaller in size. These were initially substantial wounds extending into the forefoot all the way into the heel on the bilateral plantar feet. They  are now both located on the plantar heel distal aspect both of these have a lot of callus around the wounds I used a #5 curette to remove this on the right and the left also some subcutaneous debris to try and get the wound edges were using Iodoflex. He has heel offloading shoe 12/22; 2-week follow-up. Not really much improvement. He has thick callus around the outer edges of both wounds. I remove this there is some nonviable subcutaneous tissue as well. We have been using Iodoflex. Her intake nurse and myself spontaneously thought of a total contact cast I went back in May. At that time we really were not seeing much of an improvement with a cast although the wound was in a much different situation I would like to retry this in 2 weeks and I discussed this with the patient 08/12/2021; the patient has had some improvement with the Iodoflex. The the area on the left heel plantar more improved than the right. I had to put him in a total contact cast on the left although I decided to put that off for 2 weeks. I am going to change his primary dressing to silver collagen. I think in both areas he has had some improvement most of the healing seems to be more proximal in the heel. The wounds are in the mid aspect. A lot of thick callus on the right heel however. 1/19; we are using silver collagen on both plantar heel areas. He has had some improvement today. The left did not require any debridement. He still had some eschar on the  right that was debrided but both seem to have contracted. I did not put it total contact cast on him today 2/2 we have been using silver collagen. The area on the right plantar heel has areas that appear to be epithelialized interspersed with dry flaking callus and dry skin. I removed this. This really looks better than on the other side. On the left still a large area with raised edges and debris on the surface. The patient states he is in the heel offloading boots for a prolonged  period of time and really does not use any other footwear 2/6; patient presents for first cast exchange. He has no issues or complaints today. 2/9; not much change in the left foot wound with 1 week of a cast we are using silver collagen. Silver collagen on the right side. The right side has been the better wound surface. We will reapply the total contact cast on the left 2/16; not much improvement on either side I been using silver collagen with a total contact cast on the left. I'm changing the Hydrofera Blue still with a total contact cast on the left 2/23; some improvement on both sides. Disappointing that he has thick callus around the area that we are putting in a total contact cast on the left. We've been using Hydrofera Blue on both wound areas. This is a man who at essentially pressure ulcers in addition to ischemia caused by medications to support his blood pressure (pressors) in the ICU. He was being ventilated in the standing position for severe Covid. A Shiley the wounds extended across his entire foot but are now localized to his plantar heels bilaterally. We have made progress however neither areas healed. I continue to think the total contact cast is helped albeit painstakingly slowly. He has never wanted a plastic surgery consult although I don't know that they would be interested in grafting in area in this location. 10/07/2021: Continued improvement bilaterally. There is still some callus around the left wound, despite the total contact cast. He has some increased pain in his right midfoot around 1 particular area. This has been painful in the past but seems to be a little bit worse. When his cast was removed today, there was an area on the heel of the left foot that looks a bit macerated. He is also complaining of pain in his left thigh and hip which he thinks is secondary to the limb length discrepancy caused by the cast. 10/14/2021: He continues to improve. A little bit less callus  around the left wound. He continues to endorse pain in his right midfoot, but this is not as significant as it was last week. The maceration on his left heel is improved. 10/21/2021: Continued improvement to both wounds. The maceration on his left heel is no longer evident. Less callus bilaterally. Epithelialization progressing. 10/28/2021: Significant improvement this week. The right sided wound is nearly closed with just a small open area at the middle. No maceration seen on the left heel. Continued epithelialization on both sides. No concern for infection. 11/04/2021: T oday, the wounds were measured a little bit differently and come out as larger, but I actually think they are about the same to potentially even smaller, particularly on the left. He continues to accumulate some callus on the right. 11/11/2021: T oday, the patient is expressing some concern that the left wound, despite being in the total contact cast, is not progressing at the same rate as the 1 on the right.  He is interested in trying a week without the cast to see how the wound does. The wounds are roughly the same size as last week, with the right perhaps being a little bit smaller. He continues to build up callus on both sites. 11/18/2021: Last week, I permitted the patient to go without his total contact cast, just to see if the cast was really making any difference. Today, both wounds have deteriorated to some extent, suggesting that the cast is providing benefit, at least on the left. Both are larger and have accumulated callus, slough, and other debris. 11/26/2021: I debrided both wounds quite aggressively last week in an effort to stimulate the healing cascade. This appears to have been effective as the left sided wound is a full centimeter shorter in length. Although the right was measured slightly larger, on inspection, it looks as though an area of epithelialized tissue was included in the measurements. We have been using  PolyMem Ag on the wound surfaces with a total contact cast to the left. 12/02/2021: It appears that the intake personnel are including epithelialized tissue in his wound measurements; the right wound is almost completely epithelialized; there is just a crater at the proximal midfoot with some open areas. On the left, he has built up some callus, but the overall wound surface looks good. There is some senescent skin around the wound margin. He has been in PolyMem Ag bilaterally with a total contact cast on the left. 12/09/2021: The right wound is nearly closed; there is just a small open area at the mid calcaneus. On the left, the wound is smaller with minimal callus buildup. No significant drainage. 12/16/2021: The right calcaneal wound remains minimally open at the mid calcaneus; the rest has epithelialized. On the left, the wound is also a little bit smaller. There is some senescent tissue on the periphery. He is getting his first application of a trial skin substitute called Vendaje today. 12/23/2021: The wound on his right calcaneus is nearly closed; there is just a small area at the most distal aspect of the calcaneus that is open. On the left, the area where we applied to the skin substitute has a healthier-looking bed of granulation tissue. The wound dimensions are not significantly different on this side but the wound surface is improved. 12/30/2021: The wound on the right calcaneus has not changed significantly aside from some accumulation of callus. On the left, the open area is smaller and continues to have an improved surface. He continues to accumulate callus around the wound. He is here for his third application of Vendaje. 01/06/2022: The right calcaneal wound is down to just a couple of millimeters. He continues to accumulate periwound callus. He unfortunately got his cast wet earlier in the week and his left foot is macerated, resulting in some superficial skin loss just distal to the open  wound. The open wound itself, however, is much smaller and has a healthier appearing surface. He is here for his fourth application of Vendaje. 01/13/2022: The right calcaneal wound is about the same. Unfortunately, once again, his cast got wet and his foot is again macerated. This is caused the left calcaneal wound to enlarge. He is here for his fifth application of Vendaje. 01/20/2022: The right calcaneal wound is very small. There is some periwound callus accumulation. He purchased a new cast protector last week and this has been effective in avoiding the maceration that has been occurring on the left. The left calcaneal wound is narrower and  has a healthy and viable-appearing surface. He is here for his 6 application of Vendaje. 01/27/2022: The right calcaneal wound is down to just a pinhole. There is some periwound slough and callus. On the left, the wound is narrower and shorter by about a centimeter. The surface is robust and viable-appearing. Unfortunately, the rep for the trial skin substitute product did not provide any for [place] to use today. 02/04/2022: The right calcaneal wound remains unchanged. There is more accumulated callus. On the left, although the intake nurse measured it a little bit longer, it looks about the same to me. The surface has a layer of slough, but underneath this, there is good granulation tissue. 02/10/2022: The right calcaneus wound is nearly closed. There is still some callus that builds up around the site. The left side looks about the same in terms of dimensions, but the surface is more robust and vital-appearing. 02/16/2022: The area of the right calcaneus that was nearly closed last week has closed, but there is a small opening at the mid foot where it looks like some moisture got retained and caused some reopening. The left foot wound is narrower and shallower. Both sites have a fair amount of periwound callus and eschar. 02/24/2022: The small midfoot opening on the  right calcaneus is a little bit smaller today. The left foot wound is narrower and shallower. He continues to accumulate periwound callus. No concern for infection. 03/01/2022: The patient came to clinic early because he showered and got his cast wet. Fortunately, there is no significant maceration to his foot but the callus softened and it looks like the wound on his left calcaneus may be a little bit wider. The wound on his right calcaneus is just a narrow slit. Continued accumulation of periwound callus bilaterally. 03/08/2022: The wound on his right calcaneus is very nearly closed, just a small pinpoint opening under a bit of eschar; the left wound has come in quite a bit, as well. It is narrower and shorter than at our last visit. Still with accumulated callus and eschar bilaterally. 03/17/2022: The right calcaneal wound is healed. The left wound is smaller and the surface itself is very clean, but there is some blue-green staining on the periwound callus, concerning for Pseudomonas aeruginosa. 03/23/2022: The right calcaneal wound remains closed. The left wound continues to contract. No further blue-green staining. Small amount of callus and slough accumulation. 03/28/2022: He came in early today because he had gotten his cast wet. On inspection, the wound itself did not get wet or macerated, just a little bit of the forefoot. The wound itself is basically unchanged. Patient History Information obtained from Patient. Family History Cancer - Maternal Grandparents, Diabetes - Father,Paternal Grandparents, Heart Disease - Maternal Grandparents, Hypertension - Father,Paternal Grandparents, Lung Disease - Siblings, No family history of Hereditary Spherocytosis, Kidney Disease, Seizures, Stroke, Thyroid Problems, Tuberculosis. Social History Never smoker, Marital Status - Married, Alcohol Use - Never, Drug Use - No History, Caffeine Use - Daily - tea, soda. Medical History Eyes Denies history of  Cataracts, Glaucoma, Optic Neuritis Ear/Nose/Mouth/Throat Denies history of Chronic sinus problems/congestion, Middle ear problems Hematologic/Lymphatic Denies history of Anemia, Hemophilia, Human Immunodeficiency Virus, Lymphedema, Sickle Cell Disease Respiratory Patient has history of Asthma Denies history of Aspiration, Chronic Obstructive Pulmonary Disease (COPD), Pneumothorax, Sleep Apnea, Tuberculosis Cardiovascular Patient has history of Angina - with COVID, Hypertension Denies history of Arrhythmia, Congestive Heart Failure, Coronary Artery Disease, Deep Vein Thrombosis, Hypotension, Myocardial Infarction, Peripheral Arterial Disease, Peripheral Venous Disease,  Phlebitis, Vasculitis Gastrointestinal Denies history of Cirrhosis , Colitis, Crohnoos, Hepatitis A, Hepatitis B, Hepatitis C Endocrine Denies history of Type I Diabetes, Type II Diabetes Genitourinary Denies history of End Stage Renal Disease Immunological Denies history of Lupus Erythematosus, Raynaudoos, Scleroderma Integumentary (Skin) Denies history of History of Burn Musculoskeletal Denies history of Gout, Rheumatoid Arthritis, Osteoarthritis, Osteomyelitis Neurologic Denies history of Dementia, Neuropathy, Quadriplegia, Paraplegia, Seizure Disorder Oncologic Denies history of Received Chemotherapy, Received Radiation Psychiatric Denies history of Anorexia/bulimia, Confinement Anxiety Hospitalization/Surgery History - COVID PNA 07/22/2019- 11/14/2019. - 03/27/2020 wound debridement/ skin graft. Medical A Surgical History Notes nd Constitutional Symptoms (General Health) COVID PNA 07/22/2019-11/14/2019 [facility] ECMO, foot drop left foot , Genitourinary kidney stone Psychiatric anxiety Objective Constitutional Hypertensive, asymptomatic. No acute distress.. Vitals Time Taken: 10:47 AM, Height: 69 in, Weight: 280 lbs, BMI: 41.3, Temperature: 98.5 F, Pulse: 76 bpm, Respiratory Rate: 18 breaths/min,  Blood Pressure: 168/72 mmHg. Respiratory Normal work of breathing on room air.. General Notes: 03/28/2022: He came in today because his cast got wet, but fortunately the wound was unaffected. There is just some maceration of his forefoot. No change to his wound. Integumentary (Hair, Skin) Wound #2 status is Open. Original cause of wound was Pressure Injury. The date acquired was: 10/07/2019. The wound has been in treatment 88 weeks. The wound is located on the Left Calcaneus. The wound measures 2.3cm length x 1cm width x 0.2cm depth; 1.806cm^2 area and 0.361cm^3 volume. There is Fat Layer (Subcutaneous Tissue) exposed. There is no tunneling or undermining noted. There is a medium amount of serosanguineous drainage noted. The wound margin is thickened. There is large (67-100%) red granulation within the wound bed. There is a small (1-33%) amount of necrotic tissue within the wound bed including Adherent Slough. Assessment Active Problems ICD-10 Non-pressure chronic ulcer of other part of left foot with fat layer exposed Procedures Wound #2 Pre-procedure diagnosis of Wound #2 is a Pressure Ulcer located on the Left Calcaneus . There was a T Contact Cast Procedure by Fredirick Maudlin, MD. otal Post procedure Diagnosis Wound #2: Same as Pre-Procedure Plan Follow-up Appointments: Return Appointment in 1 week. - Dr [name] - Room 3 Thurs. 8/31 @ 09:30 am Anesthetic: Wound #2 Left Calcaneus: (In clinic) Topical Lidocaine 4% applied to wound bed Bathing/ Shower/ Hygiene: May shower with protection but do not get wound dressing(s) wet. - Cover with cast protector (can purchase cast protector at [facility] or [facility] ) Edema Control - Lymphedema / SCD / Other: Elevate legs to the level of the heart or above for 30 minutes daily and/or when sitting, a frequency of: - throughout the day Avoid standing for long periods of time. Moisturize legs daily. - right leg and foot every night. Off-Loading: T  Contact Cast to Left Lower Extremity otal Other: - keep pressure off of the bottom of your feet Additional Orders / Instructions: Follow Nutritious Diet The following medication(s) was prescribed: lidocaine topical 4 % cream cream topical for prior to debridement was prescribed at facility WOUND #2: - Calcaneus Wound Laterality: Left Cleanser: Normal Saline (Generic) 1 x Per Week/30 Days Discharge Instructions: Cleanse the wound with Normal Saline prior to applying a clean dressing using gauze sponges, not tissue or cotton balls. Cleanser: Wound Cleanser 1 x Per Week/30 Days Discharge Instructions: Cleanse the wound with wound cleanser prior to applying a clean dressing using gauze sponges, not tissue or cotton balls. Prim Dressing: Endoform 2x2 in 1 x Per Week/30 Days ary Discharge Instructions: Moisten with saline Secondary  Dressing: Optifoam Non-Adhesive Dressing, 4x4 in (Generic) 1 x Per Week/30 Days Discharge Instructions: Apply over primary dressing as directed. Secondary Dressing: Woven Gauze Sponge, Non-Sterile 4x4 in 1 x Per Week/30 Days Discharge Instructions: Apply over primary dressing as directed. Secured With: 56M Medipore H Soft Cloth Surgical T ape, 4 x 10 (in/yd) (Generic) 1 x Per Week/30 Days Discharge Instructions: Secure with tape as directed. 03/28/2022: He came in today because his cast got wet, but fortunately the wound was unaffected. There is just some maceration of his forefoot. No change to his wound. As we no longer have donated skin substitute for use, we will apply endoform and reapply his total contact cast. Follow-up in 1 week. Electronic Signature(s) Signed: 03/28/2022 11:21:05 AM By: Fredirick Maudlin MD FACS Entered By: Fredirick Maudlin on 03/28/2022 11:21:05 -------------------------------------------------------------------------------- HxROS Details Patient Name: Date of Service: [name], [place]. 03/28/2022 11:00 A M Medical Record Number:  [id number] Patient Account Number: [id number] Date of Birth/Sex: Treating RN: May 12, 1976 (46 y.o. M) Primary Care Provider: [name] Other Clinician: Referring Provider: Treating Provider/Extender: [name] in Treatment: 21 Information Obtained From Patient Constitutional Symptoms ([place]) Medical History: Past Medical History Notes: COVID PNA 07/22/2019-11/14/2019 [facility] ECMO, foot drop left foot , Eyes Medical History: Negative for: Cataracts; Glaucoma; Optic Neuritis Ear/Nose/Mouth/Throat Medical History: Negative for: Chronic sinus problems/congestion; Middle ear problems Hematologic/Lymphatic Medical History: Negative for: Anemia; Hemophilia; Human Immunodeficiency Virus; Lymphedema; Sickle Cell Disease Respiratory Medical History: Positive for: Asthma Negative for: Aspiration; Chronic Obstructive Pulmonary Disease (COPD); Pneumothorax; Sleep Apnea; Tuberculosis Cardiovascular Medical History: Positive for: Angina - with COVID; Hypertension Negative for: Arrhythmia; Congestive Heart Failure; Coronary Artery Disease; Deep Vein Thrombosis; Hypotension; Myocardial Infarction; Peripheral Arterial Disease; Peripheral Venous Disease; Phlebitis; Vasculitis Gastrointestinal Medical History: Negative for: Cirrhosis ; Colitis; Crohns; Hepatitis A; Hepatitis B; Hepatitis C Endocrine Medical History: Negative for: Type I Diabetes; Type II Diabetes Genitourinary Medical History: Negative for: End Stage Renal Disease Past Medical History Notes: kidney stone Immunological Medical History: Negative for: Lupus Erythematosus; Raynauds; Scleroderma Integumentary (Skin) Medical History: Negative for: History of Burn Musculoskeletal Medical History: Negative for: Gout; Rheumatoid Arthritis; Osteoarthritis; Osteomyelitis Neurologic Medical History: Negative for: Dementia; Neuropathy; Quadriplegia; Paraplegia; Seizure Disorder Oncologic Medical  History: Negative for: Received Chemotherapy; Received Radiation Psychiatric Medical History: Negative for: Anorexia/bulimia; Confinement Anxiety Past Medical History Notes: anxiety Immunizations Pneumococcal Vaccine: Received Pneumococcal Vaccination: No Implantable Devices None Hospitalization / Surgery History Type of Hospitalization/Surgery COVID PNA 07/22/2019- 11/14/2019 03/27/2020 wound debridement/ skin graft Family and Social History Cancer: Yes - Maternal Grandparents; Diabetes: Yes - Father,Paternal Grandparents; Heart Disease: Yes - Maternal Grandparents; Hereditary Spherocytosis: No; Hypertension: Yes - Father,Paternal Grandparents; Kidney Disease: No; Lung Disease: Yes - Siblings; Seizures: No; Stroke: No; Thyroid Problems: No; Tuberculosis: No; Never smoker; Marital Status - Married; Alcohol Use: Never; Drug Use: No History; Caffeine Use: Daily - tea, soda; Financial Concerns: No; Food, Clothing or Shelter Needs: No; Support System Lacking: No; Transportation Concerns: No Electronic Signature(s) Signed: 03/28/2022 11:33:20 AM By: Fredirick Maudlin MD FACS Entered By: Fredirick Maudlin on 03/28/2022 11:18:28 -------------------------------------------------------------------------------- Total Contact Cast Details Patient Name: Date of Service: [name], [place] 03/28/2022 11:00 A M Medical Record Number: [id number] Patient Account Number: [id number] Date of Birth/Sex: Treating RN: [birth year] (46 y.o. [name] Primary Care Provider: [name] Other Clinician: Referring Provider: Treating Provider/Extender: [name] in Treatment: 54 T Contact Cast Applied for Wound Assessment: otal Wound #2 Left Calcaneus Performed By:  Physician Fredirick Maudlin, MD Post Procedure Diagnosis Same as Pre-procedure Electronic Signature(s) Signed: 03/28/2022 11:33:20 AM By: Fredirick Maudlin MD FACS Signed: 03/28/2022 5:48:43 PM By: Baruch Gouty  RN, BSN Entered By: Baruch Gouty on 03/28/2022 11:13:06 -------------------------------------------------------------------------------- [place] Details Patient Name: Date of Service: [name], [place]. 03/28/2022 Medical Record Number: [id number] Patient Account Number: [id number] Date of Birth/Sex: Treating RN: 1975-11-21 (46 y.o. M) Primary Care Provider: [name] Other Clinician: Referring Provider: Treating Provider/Extender: [name] in Treatment: 74 Diagnosis Coding ICD-10 Codes Code Description [phone] Non-pressure chronic ulcer of other part of left foot with fat layer exposed Facility Procedures CPT4 Code: [id number] Description: [phone] - APPLY TOTAL CONTACT LEG CAST ICD-10 Diagnosis Description L97.522 Non-pressure chronic ulcer of other part of left foot with fat layer exposed Modifier: Quantity: 1 Physician Procedures : CPT4 Code Description Modifier [id number] - WC PHYS LEVEL 3 - EST PT ICD-10 Diagnosis Description L97.522 Non-pressure chronic ulcer of other part of left foot with fat layer exposed Quantity: 1 : [id number] - WC PHYS APPLY TOTAL CONTACT CAST 1 ICD-10 Diagnosis Description L97.522 Non-pressure chronic ulcer of other part of left foot with fat layer exposed Quantity: Electronic Signature(s) Signed: 03/28/2022 11:21:25 AM By: Fredirick Maudlin MD FACS Entered By: Fredirick Maudlin on 03/28/2022 11:21:24

## 2022-03-29 ENCOUNTER — Encounter: Payer: Self-pay | Admitting: Allergy & Immunology

## 2022-03-29 ENCOUNTER — Ambulatory Visit (INDEPENDENT_AMBULATORY_CARE_PROVIDER_SITE_OTHER): Payer: PPO | Admitting: Allergy & Immunology

## 2022-03-29 VITALS — BP 134/84 | HR 78 | Temp 97.8°F | Resp 16

## 2022-03-29 DIAGNOSIS — J3089 Other allergic rhinitis: Secondary | ICD-10-CM

## 2022-03-29 DIAGNOSIS — J4540 Moderate persistent asthma, uncomplicated: Secondary | ICD-10-CM

## 2022-03-29 NOTE — Progress Notes (Signed)
FOLLOW UP  Date of Service/Encounter:  03/29/22   Assessment:   Moderate persistent asthma, uncomplicated   Perennial and seasonal allergic rhinitis (grass, weeds, ragweed, trees, dust mites, cat, and dog)   Prolonged hospitalization for COVID19 requiring ECMO in late 2020   Fully vaccinated against COVID19   Overall, [name] is doing fairly well.  His lung testing does look lower today, which I think is likely because he is not using his Symbicort twice a day.  To ease his regimen, we are going to switch to Trelegy 1 puff once daily.  He seems excited about that.  We are also getting some blood work to see if he would qualify for a biologic in case we need to go in that direction in the future.  His allergic rhinitis is under good control, but since we are getting blood we will just go ahead and update his allergy testing via the blood to see if there are new sensitizations that might be relevant.  Plan/Recommendations:   1. Moderate persistent asthma, uncomplicated - Lung testing looked slightly lower today.  - We are going to change you from Symbicort to Trelegy, which contains three medications in one inhaler. - The nice thing about Trelegy is that the medications last in your body for 24 hours.  - Daily controller medication(s): Trelegy 100/62.5/25 one puff once daily - Prior to physical activity: albuterol 2 puffs 10-15 minutes before physical activity. - Rescue medications: albuterol 4 puffs every 4-6 hours as needed - Asthma control goals:  * Full participation in all desired activities (may need albuterol before activity) * Albuterol use two time or less a week on average (not counting use with activity) * Cough interfering with sleep two time or less a month * Oral steroids no more than once a year * No hospitalizations  2. Allergic rhinitis - well controlled - Continue with Nasacort one spray per nostril daily. - Continue with Atrovent nasal spray one spray per nostril  every 8 hours as needed for runny nose.  - Continue with cetirizine 10mg  daily.  3. Return in about 3 months (around 06/29/2022).    Subjective:   [name] is a 46 y.o. male presenting today for follow up of  Chief Complaint  Patient presents with   Follow-up    [name] has a history of the following: Patient Active Problem List   Diagnosis Date Noted   Obstructive sleep apnea 01/11/2022   Insomnia due to other mental disorder 12/06/2021   Left foot drop 09/06/2021   Wheelchair dependence 06/04/2021   Encounter for therapeutic drug monitoring 09/29/2020   COVID-19 long hauler manifesting chronic dyspnea 09/15/2020   Morbid obesity with body mass index of 40.0-44.9 in adult 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   Impaired sensation to light touch 04/15/2020   Diffuse alveolar damage (HCC) 03/09/2020   Eschar of foot    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   History of COVID-19    Pressure injury of skin 08/22/2019   Need for emotional support    Chest tube in place    Personal history of ECMO  Advanced care planning/counseling discussion    Palliative care by specialist    Goals of care, counseling/discussion    Advanced directives, counseling/discussion    Subcutaneous crepitus    Thrombocytopenia (HCC)    Leukopenia    Fever    Gram positive bacterial infection    Septic arthritis of right acromioclavicular joint (HCC) 09/29/2017   Chronic left-sided low back pain with left-sided sciatica 09/19/2017   Epigastric  pain    Essential hypertension 04/17/2017   Moderate persistent asthma 07/21/2015   Allergic rhinoconjunctivitis 07/21/2015   GERD (gastroesophageal reflux disease) 07/21/2015   Abnormal gait 11/12/2009   TARSAL TUNNEL SYNDROME, LEFT 10/08/2009   PES PLANUS 10/08/2009    History obtained from: chart review and patient.  [name] is a 46 y.o. male presenting for a follow up visit.  He was last seen in February 2023.  At that time, his lung testing looked very stable.  We did not make any medication changes.  We continued the Symbicort 160 mcg 2 puffs twice daily with spacer as well as albuterol as needed.  For his rhinitis, we will continue with Nasacort as well as Atrovent and cetirizine.  Since last visit, he has mostly done well.  Asthma/Respiratory Symptom History: He uses the Symbicort at least two puffs once daily in the morning. He is using a spacer. The evening dose is less consistent. He goes to bed a lot later and he does not even think about it. He does endorse some tightness but this has been his new baseline. He has not needed prednisone for his breathing at all. He is hoping that he does not need oxygen all of the time.  He sees Dr. Judeth Horn. He is on two L O2 at night. He is unsure what the long term plan regarding his oxygen is. He reports that he is not sleeping well.  They did not say that he needed a CPAP at all.   Allergic Rhinitis Symptom History: Allergic rhinitis is under fair control with the use of Nasacort as well as cetirizine. He also has Atrovent to use when he has particular runny noses. He had skin testing performed in January 2002 that demonstrated positive to grass, weeds, ragweed, trees, dust mites, cat, and dog. He has not needed antibiotics at all since the last visit.   He continues to see Wound Care weekly for management of his left pressure ulcer on his heel. The one on the right has healed completely and he is now wearing shoes on that foot. He is slowly  healing on the left. He continues to use a wheelchair for the vast majority of his mobility.   Otherwise, there have been no changes to his past medical history, surgical history, family history, or social history.    Review of Systems  Constitutional: Negative.  Negative for chills, fever, malaise/fatigue and weight loss.  HENT: Negative.  Negative for congestion, ear discharge, ear pain and sinus pain.   Eyes:  Negative for pain, discharge and redness.  Respiratory:  Positive for shortness of breath. Negative for cough, sputum production and wheezing.   Cardiovascular: Negative.  Negative for chest pain and palpitations.  Gastrointestinal:  Negative for abdominal pain, constipation, diarrhea, heartburn, nausea and vomiting.  Skin: Negative.  Negative for itching and rash.  Neurological:  Negative for dizziness and headaches.  Endo/Heme/Allergies:  Negative for environmental allergies. Does not bruise/bleed easily.       Objective:   Blood pressure 134/84, pulse 78, temperature 97.8 F (  36.6 C), resp. rate 16, SpO2 94 %. There is no height or weight on file to calculate BMI.    Physical Exam Vitals reviewed.  Constitutional:      Appearance: He is well-developed.     Comments: Sitting in a chair, although he came in using his wheelchair. Talkative. Comfortable appearing.   HENT:     Head: Normocephalic and atraumatic.     Right Ear: Tympanic membrane, ear canal and external ear normal.     Left Ear: Tympanic membrane, ear canal and external ear normal.     Nose: No nasal deformity, septal deviation, mucosal edema or rhinorrhea.     Right Turbinates: Enlarged, swollen and pale.     Left Turbinates: Enlarged, swollen and pale.     Right Sinus: No maxillary sinus tenderness or frontal sinus tenderness.     Left Sinus: No maxillary sinus tenderness or frontal sinus tenderness.     Mouth/Throat:     Mouth: Mucous membranes are not pale and not dry.     Pharynx: Uvula midline.   Eyes:     General: Lids are normal. Allergic shiner present.        Right eye: No discharge.        Left eye: No discharge.     Conjunctiva/sclera: Conjunctivae normal.     Right eye: Right conjunctiva is not injected. No chemosis.    Left eye: Left conjunctiva is not injected. No chemosis.    Pupils: Pupils are equal, round, and reactive to light.  Cardiovascular:     Rate and Rhythm: Normal rate and regular rhythm.     Heart sounds: Normal heart sounds.  Pulmonary:     Effort: Pulmonary effort is normal. No tachypnea, accessory muscle usage or respiratory distress.     Breath sounds: Normal breath sounds. No wheezing, rhonchi or rales.     Comments: Coarse airway sounds throughout. Talkative and interactive.  Chest:     Chest wall: No tenderness.  Lymphadenopathy:     Cervical: No cervical adenopathy.  Skin:    General: Skin is warm.     Capillary Refill: Capillary refill takes less than 2 seconds.     Coloration: Skin is not pale.     Findings: Wound present. No abrasion, erythema, petechiae or rash. Rash is not papular, urticarial or vesicular.     Comments: No eczematous or urticarial lesions noted. He does have dressing in place on his feet secondary to pressure ulcers which are healing albeit slowly.   Neurological:     Mental Status: He is alert.  Psychiatric:        Behavior: Behavior is cooperative.      Diagnostic studies:    Spirometry: results abnormal (FEV1: 2.18/58%, FVC: 2.77/59%, FEV1/FVC: 79%).    Spirometry consistent with possible restrictive disease.  Overall, values are lower than they have been in they have been in the past.    Allergy Studies: environmental allergy panel with IgE and CBC with differential        Malachi Bonds, MD  [facility]

## 2022-03-29 NOTE — Patient Instructions (Addendum)
1. Moderate persistent asthma, uncomplicated - Lung testing looked slightly lower today.  - We are going to change you from Symbicort to Trelegy, which contains three medications in one inhaler. - The nice thing about Trelegy is that the medications last in your body for 24 hours.  - Daily controller medication(s): Trelegy 100/62.5/25 one puff once daily - Prior to physical activity: albuterol 2 puffs 10-15 minutes before physical activity. - Rescue medications: albuterol 4 puffs every 4-6 hours as needed - Asthma control goals:  * Full participation in all desired activities (may need albuterol before activity) * Albuterol use two time or less a week on average (not counting use with activity) * Cough interfering with sleep two time or less a month * Oral steroids no more than once a year * No hospitalizations  2. Allergic rhinitis - well controlled - Continue with Nasacort one spray per nostril daily. - Continue with Atrovent nasal spray one spray per nostril every 8 hours as needed for runny nose.  - Continue with cetirizine 10mg  daily.  3. Return in about 3 months (around 06/29/2022).    Please inform 07/01/2022 of any Emergency Department visits, hospitalizations, or changes in symptoms. Call us before going to the ED for breathing or allergy symptoms since we might be able to fit you in for a sick visit. Feel free to contact us anytime with any questions, problems, or concerns.  It was a pleasure to see you and your family again today! Give [place] an update!   Websites that have reliable patient information: 1. American Academy of Asthma, Allergy, and Immunology: www.aaaai.org 2. Food Allergy Research and Education (FARE): foodallergy.org 3. Mothers of Asthmatics: http://www.asthmacommunitynetwork.org 4. American College of Allergy, Asthma, and Immunology: www.acaai.org   COVID-19 Vaccine Information can be found at:  [place] For questions related to vaccine distribution or appointments, please email [email or web address] or call [phone].   We realize that you might be concerned about having an allergic reaction to the COVID19 vaccines. To help with that concern, WE ARE OFFERING THE COVID19 VACCINES IN OUR OFFICE! Ask the front desk for dates!     "Like" [phone] on Facebook and Instagram for our latest updates!      A healthy democracy works best when [place] participate! Make sure you are registered to vote! If you have moved or changed any of your contact information, you will need to get this updated before voting!  In some cases, you MAY be able to register to vote online: [employer]

## 2022-03-30 ENCOUNTER — Ambulatory Visit (HOSPITAL_BASED_OUTPATIENT_CLINIC_OR_DEPARTMENT_OTHER): Payer: PPO | Admitting: General Surgery

## 2022-03-31 LAB — CBC WITH DIFFERENTIAL
Basophils Absolute: 0.1 10*3/uL (ref 0.0–0.2)
Basos: 1 %
EOS (ABSOLUTE): 0.2 10*3/uL (ref 0.0–0.4)
Eos: 2 %
Hematocrit: 48.8 % (ref 37.5–51.0)
Hemoglobin: 15.8 g/dL (ref 13.0–17.7)
Immature Grans (Abs): 0 10*3/uL (ref 0.0–0.1)
Immature Granulocytes: 0 %
Lymphocytes Absolute: 2 10*3/uL (ref 0.7–3.1)
Lymphs: 23 %
MCH: 27.5 pg (ref 26.6–33.0)
MCHC: 32.4 g/dL (ref 31.5–35.7)
MCV: 85 fL (ref 79–97)
Monocytes Absolute: 0.5 10*3/uL (ref 0.1–0.9)
Monocytes: 6 %
Neutrophils Absolute: 5.8 10*3/uL (ref 1.4–7.0)
Neutrophils: 68 %
RBC: 5.74 x10E6/uL (ref 4.14–5.80)
RDW: 12.7 % (ref 11.6–15.4)
WBC: 8.5 10*3/uL (ref 3.4–10.8)

## 2022-03-31 LAB — IGE: IgE (Immunoglobulin E), Serum: 393 IU/mL (ref 6–495)

## 2022-04-01 ENCOUNTER — Encounter: Payer: PPO | Attending: Physical Medicine and Rehabilitation | Admitting: Physical Medicine and Rehabilitation

## 2022-04-01 ENCOUNTER — Encounter: Payer: Self-pay | Admitting: Physical Medicine and Rehabilitation

## 2022-04-01 VITALS — BP 138/78 | HR 75 | Ht 68.0 in | Wt 308.0 lb

## 2022-04-01 DIAGNOSIS — M21372 Foot drop, left foot: Secondary | ICD-10-CM | POA: Diagnosis not present

## 2022-04-01 DIAGNOSIS — R0609 Other forms of dyspnea: Secondary | ICD-10-CM | POA: Diagnosis not present

## 2022-04-01 DIAGNOSIS — G6281 Critical illness polyneuropathy: Secondary | ICD-10-CM

## 2022-04-01 DIAGNOSIS — U099 Post covid-19 condition, unspecified: Secondary | ICD-10-CM | POA: Insufficient documentation

## 2022-04-01 DIAGNOSIS — Z993 Dependence on wheelchair: Secondary | ICD-10-CM

## 2022-04-01 NOTE — Patient Instructions (Addendum)
Pt is a 46 yr old male with COVID ICU myopathy, Long COVID,  s/p surgery on feet due to necrosis from long ICU stay/pressors to save life-s/p  skin grafts and R foot osteomyelitis as well. Also has L foot drop. Still w/c dependent- still cannot put weight on feet per Plastics.  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  2.  Suggest a full spectrum light- for when AWAKE- 1 hour/day. To help seasonal affective depression. Start using Around September 15th- 1 hour/day- have light < 7 ft from you to use it. Use til next March - every winter.    3. If needs referral to weight loss center, needs to get from PCP- or can try [facility], but haven't heard about them before.  It costs $3500- I suggest to look [facility] first. And can get referral from PCP- the computer won't let me put it in.   4.  Can put back in PT when L foot is healed to improve strength, function.   5. F/U in 3 months -double appt.   6. Takes protein shakes with 30 G protein 1-2x/day.

## 2022-04-01 NOTE — Progress Notes (Signed)
Subjective:    Patient ID: [name], male    DOB: 07-08-1976, 46 y.o.   MRN: [id number]  HPI Pt is a 46 yr old male with COVID ICU myopathy, Long COVID,  s/p surgery on feet due to necrosis from long ICU stay/pressors to save life-s/p  skin grafts and R foot osteomyelitis as well. Occurred 12/20.  Also has L foot drop. Still w/c dependent- still cannot put weight on feet per Plastics.  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  Trying to et healthier but hard to exercise since L foot.   RLE is HEALED!!!! LLE/foot- in transfer/walking boot and hard cast- changed 1x/week-  Not supposed to walk much on L foot.  Has been putting on cast last 3 months ago.  Hadn't wanted it.   Drives him bananas- but gotten MORE used to it.  Still bothers him.   They don't know how much longer will be in cast/boot on L. .   Getting ready to go to Physical next month-   Isn't sleeping well-   Going to see Avelina Laine- psychologist.  Jamal Maes to see psychiatry- that he recommended.  Still on Lamictal- also tried Auvelity- giving really bad dry mouth- stopped taking it.  Going back next month to see the NP.   Still taking Duloxetine 80 mg daily- did get increased by NP.  Still taking Trazodone to sleep 200 mg QHS.   Wife asking [facility]- started going to-had consultation- "reprogram metabolism".  Use supplements- drops- to suppress appetite and inflammation- aiming to get off the supplements long term.  Eat smaller meals and gradually go back down.   Still bothers him; being a long covid pt, but is coming ot terms with it.  Missing part of son's growing up.  Cannot do sports/swimming, etc with him.   Doesn't think legs strong enough to do knee scooter- to try and get rid of wc-   Has changed eating habits- has cheat days still, but eating better. Drinking tons of water/day.     Been a roller  coaster marriage wise since saw me last.  Back in same bed for first time in 16 years.  Pt needs to spread out when sleeps- and not sleeping well- has a queen- getting a king. And Dog sleeps with them. Small dog.  Son going back to school Monday-  Parents who have been helping- having health issues.  Sister got a full time job and now nieces are working now and aunt is having BP issues.  Will have to get to doctor's appointment on own.   Pain Inventory Average Pain 3 Pain Right Now 2 My pain is intermittent, constant, sharp, and burning  In the last 24 hours, has pain interfered with the following? General activity 3 Relation with others 0 Enjoyment of life 5 What TIME of day is your pain at its worst? night Sleep (in general) Poor  Pain is worse with: walking, bending, sitting, inactivity, standing, and some activites Pain improves with: rest and medication Relief from Meds: 5  Family History  Problem Relation Age of Onset   Asthma Brother    Diabetes Father    Hypertension Father    Diabetes Paternal Grandmother    Hypertension Paternal Grandmother    Migraines Mother    GER disease Mother    Pancreatic cancer Maternal Grandmother    Heart disease Maternal Grandfather  Social History   Socioeconomic History   Marital status: Married    Spouse name: Not on file   Number of children: 1   Years of education: Not on file   Highest education level: Not on file  Occupational History   Occupation: delivery driver  Tobacco Use   Smoking status: Never   Smokeless tobacco: Never  Vaping Use   Vaping Use: Never used  Substance and Sexual Activity   Alcohol use: Yes    Comment: rarely 2-3 times a year   Drug use: No   Sexual activity: Yes    Partners: Female  Other Topics Concern   Not on file  Social History Narrative   Not on file   Social Determinants of Health   Financial Resource Strain: Not on file  Food Insecurity: Not on file  Transportation Needs:  Not on file  Physical Activity: Not on file  Stress: Not on file  Social Connections: Not on file   Past Surgical History:  Procedure Laterality Date   CANNULATION FOR ECMO (EXTRACORPOREAL MEMBRANE OXYGENATION) N/A 08/28/2019   Procedure: CANNULATION FOR VV ECMO (EXTRACORPOREAL MEMBRANE OXYGENATION);  Surgeon: Donata Clay, Theron Arista, MD;  Location: [facility];  Service: Open Heart Surgery;  Laterality: N/A;  CRESCENT CANNULA   CANNULATION FOR ECMO (EXTRACORPOREAL MEMBRANE OXYGENATION) N/A 09/10/2019   Procedure: CANNULATION FOR ECMO (EXTRACORPOREAL MEMBRANE OXYGENATION) PUTTING IN CRESCENT 32FR CANNULA  AND REMOVING GROING CANNULATION;  Surgeon: Linden Dolin, MD;  Location: [facility];  Service: Open Heart Surgery;  Laterality: N/A;  PUTTING IN CRESCENT/REMOVING GROIN CANNULATION   CYSTOSCOPY/URETEROSCOPY/HOLMIUM LASER/STENT PLACEMENT Left 04/18/2019   Procedure: LEFT URETEROSCOPY/HOLMIUM LASER/STENT PLACEMENT;  Surgeon: Crist Fat, MD;  Location: [facility];  Service: Urology;  Laterality: Left;   CYSTOSCOPY/URETEROSCOPY/HOLMIUM LASER/STENT PLACEMENT Left 05/02/2019   Procedure: CYSTOSCOPY/URETEROSCOPY/HOLMIUM LASER/STENT EXCHANGE;  Surgeon: Crist Fat, MD;  Location: [facility];  Service: Urology;  Laterality: Left;   ECMO CANNULATION N/A 08/03/2019   Procedure: ECMO CANNULATION;  Surgeon: Linden Dolin, MD;  [facility];  Service: Cardiovascular;  Laterality: N/A;   ESOPHAGOGASTRODUODENOSCOPY N/A 09/11/2019   Procedure: ESOPHAGOGASTRODUODENOSCOPY (EGD);  Surgeon: Linden Dolin, MD;  Location: [facility] OR;  Service: Thoracic;  Laterality: N/A;   GRAFT APPLICATION Bilateral 03/27/2020   Procedure: APPLICATION OF SKIN GRAFT BILATERAL FEET;  Surgeon: Felecia Shelling, DPM;  Location: [facility];  Service: Podiatry;  Laterality: Bilateral;   IR REPLC GASTRO/COLONIC TUBE PERCUT W/FLUORO  10/14/2019   IRRIGATION AND DEBRIDEMENT SHOULDER Right 09/29/2017   Procedure: IRRIGATION  AND DEBRIDEMENT SHOULDER;  Surgeon: Tarry Kos, MD;  Location: [facility];  Service: Orthopedics;  Laterality: Right;   LUMBAR DISC SURGERY  2002   NASAL ENDOSCOPY WITH EPISTAXIS CONTROL N/A 08/31/2019   Procedure: NASAL ENDOSCOPY WITH EPISTAXIS CONTROL WITH CAUTERIZATION;  Surgeon: Christia Reading, MD;  Location: [facility] OR;  Service: ENT;  Laterality: N/A;   PORTACATH PLACEMENT N/A 09/11/2019   Procedure: PEG TUBE INSERTION - BEDSIDE;  Surgeon: Linden Dolin, MD;  Location[facility];  Service: Thoracic;  Laterality: N/A;   TEE WITHOUT CARDIOVERSION N/A 08/28/2019   Procedure: TRANSESOPHAGEAL ECHOCARDIOGRAM (TEE);  Surgeon: Donata Clay, Theron Arista, MD;  Location: [facility] OR;  Service: Open Heart Surgery;  Laterality: N/A;   TEE WITHOUT CARDIOVERSION N/A 09/10/2019   Procedure: TRANSESOPHAGEAL ECHOCARDIOGRAM (TEE);  Surgeon: Linden Dolin, MD;  Location: [facility] OR;  Service: Open Heart Surgery;  Laterality: N/A;   WOUND DEBRIDEMENT Bilateral 03/27/2020  Procedure: EXCISIONAL DEBRIDEMENT OF ULCERS BILATERAL FEET;  Surgeon: Felecia Shelling, DPM;  [facility];  Service: Podiatry;  Laterality: Bilateral;   Past Surgical History:  Procedure Laterality Date   CANNULATION FOR ECMO (EXTRACORPOREAL MEMBRANE OXYGENATION) N/A 08/28/2019   Procedure: CANNULATION FOR VV ECMO (EXTRACORPOREAL MEMBRANE OXYGENATION);  Surgeon: Donata Clay, Theron Arista, MD;  Location: [facility] OR;  Service: Open Heart Surgery;  Laterality: N/A;  CRESCENT CANNULA   CANNULATION FOR ECMO (EXTRACORPOREAL MEMBRANE OXYGENATION) N/A 09/10/2019   Procedure: CANNULATION FOR ECMO (EXTRACORPOREAL MEMBRANE OXYGENATION) PUTTING IN CRESCENT 32FR CANNULA  AND REMOVING GROING CANNULATION;  Surgeon: Linden Dolin, MD;  [facility];  Service: Open Heart Surgery;  Laterality: N/A;  PUTTING IN CRESCENT/REMOVING GROIN CANNULATION   CYSTOSCOPY/URETEROSCOPY/HOLMIUM LASER/STENT PLACEMENT Left 04/18/2019   Procedure: LEFT URETEROSCOPY/HOLMIUM LASER/STENT PLACEMENT;  Surgeon: Crist Fat, MD;  Location: [facility];  Service: Urology;  Laterality: Left;   CYSTOSCOPY/URETEROSCOPY/HOLMIUM LASER/STENT PLACEMENT Left 05/02/2019   Procedure: CYSTOSCOPY/URETEROSCOPY/HOLMIUM LASER/STENT EXCHANGE;  Surgeon: Crist Fat, MD;  [facility];  Service: Urology;  Laterality: Left;   ECMO CANNULATION N/A 08/03/2019   Procedure: ECMO CANNULATION;  Surgeon: Linden Dolin, MD;  [facility];  Service: Cardiovascular;  Laterality: N/A;   ESOPHAGOGASTRODUODENOSCOPY N/A 09/11/2019   Procedure: ESOPHAGOGASTRODUODENOSCOPY (EGD);  Surgeon: Linden Dolin, MD;  Location: [facility] OR;  Service: Thoracic;  Laterality: N/A;   GRAFT APPLICATION Bilateral 03/27/2020   Procedure: APPLICATION OF SKIN GRAFT BILATERAL FEET;  Surgeon: Felecia Shelling, DPM;  [facility];  Service: Podiatry;  Laterality: Bilateral;   IR REPLC GASTRO/COLONIC TUBE PERCUT W/FLUORO  10/14/2019   IRRIGATION AND DEBRIDEMENT SHOULDER Right 09/29/2017   Procedure: IRRIGATION AND DEBRIDEMENT SHOULDER;  Surgeon: Tarry Kos, MD;  [facility];  Service: Orthopedics;  Laterality: Right;   LUMBAR DISC SURGERY  2002   NASAL ENDOSCOPY WITH EPISTAXIS CONTROL N/A 08/31/2019   Procedure: NASAL ENDOSCOPY WITH EPISTAXIS CONTROL WITH CAUTERIZATION;  Surgeon: Christia Reading, MD;  Location: [facility] OR;  Service: ENT;  Laterality: N/A;   PORTACATH PLACEMENT N/A 09/11/2019   Procedure: PEG TUBE INSERTION - BEDSIDE;  Surgeon: Linden Dolin, MD;  Location[facility];  Service: Thoracic;  Laterality: N/A;   TEE WITHOUT CARDIOVERSION N/A 08/28/2019   Procedure: TRANSESOPHAGEAL ECHOCARDIOGRAM (TEE);  Surgeon: Donata Clay, Theron Arista, MD;  Location: [facility] OR;  Service: Open Heart Surgery;  Laterality: N/A;   TEE WITHOUT CARDIOVERSION N/A 09/10/2019   Procedure: TRANSESOPHAGEAL ECHOCARDIOGRAM (TEE);  Surgeon: Linden Dolin, MD;  Location: [facility] OR;  Service: Open Heart Surgery;  Laterality: N/A;   WOUND DEBRIDEMENT Bilateral  03/27/2020   Procedure: EXCISIONAL DEBRIDEMENT OF ULCERS BILATERAL FEET;  Surgeon: Felecia Shelling, DPM;  Location[facility];  Service: Podiatry;  Laterality: Bilateral;   Past Medical History:  Diagnosis Date   Anginal pain (HCC)    with covid   Anxiety    Asthma    Dyspnea    GERD (gastroesophageal reflux disease)    Headache    History of kidney stones    LEFT URETERAL STONE   HTN (hypertension)    Pancreatitis 2018   GALLBALDDER SLUDGE CAUSED ISSUED RESOLVED   Pneumonia 07/2019   covid   BP 138/78   Pulse 75   Ht 5\' 8"  (1.727 m)   Wt (!) 308 lb (139.7 kg)   SpO2 96%   BMI 46.83 kg/m   Opioid Risk Score:   Fall Risk Score:  `1  Depression screen PHQ 2/9     10/25/2021    1:45 PM 07/21/2021    3:12 PM 06/04/2021    1:53 PM 03/03/2021   10:08 AM 01/01/2021    9:26 AM 11/04/2020    9:38 AM 10/15/2020    1:54 PM  Depression screen PHQ 2/9  Decreased Interest   1 1 0 1 0  Down, Depressed, Hopeless   1 1 0 1 1  PHQ - 2 Score   2 2 0 2 1  Altered sleeping         Tired, decreased energy         Change in appetite         Feeling bad or failure about yourself          Trouble concentrating         Moving slowly or fidgety/restless         Suicidal thoughts         PHQ-9 Score            Information is confidential and restricted. Go to Review Flowsheets to unlock data.    Review of Systems  Musculoskeletal:  Positive for back pain.       Both knees, both feet, both shoulders  All other systems reviewed and are negative.      Objective:   Physical Exam  Awake, alert, more interactive today; accompanied by wife; in manual w/c; in cast with walking boot, NAD Abrasion just below L knee- quarter sized- dried blood, but not open Wearing tennis shoe on R foot.   Saw picture of L foot- Has scar down lateral aspect of bottom of foot- large open wound on heel and small wound on hypothenar eminence of base of 5th MTP      Assessment & Plan:   Pt is a 46 yr old  male with COVID ICU myopathy, Long COVID,  s/p surgery on feet due to necrosis from long ICU stay/pressors to save life-s/p  skin grafts and R foot osteomyelitis as well. Also has L foot drop. Still w/c dependent- still cannot put weight on feet per Plastics.  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  2.  Suggest a full spectrum light- for when AWAKE- 1 hour/day. To help seasonal affective depression. Start using Around September 15th- 1 hour/day- have light < 7 ft from you to use it. Use til next March - every winter.    3. If needs referral to weight loss center, needs to get from PCP- or can try [facility], but haven't heard about them before.  It costs $3500- I suggest to look [facility] first. And can get referral from PCP- the computer won't let me put it in.   4.  Can put back in PT when L foot is healed to improve strength, function.   5. F/U in 3 months -double appt.   6. Takes protein shakes with 30 G protein 1-2x/day.   I spent a total of  32  minutes on total care today- >50% coordination of care- due to discussion of weight loss  and grief issues.

## 2022-04-07 ENCOUNTER — Encounter (HOSPITAL_BASED_OUTPATIENT_CLINIC_OR_DEPARTMENT_OTHER): Payer: PPO | Admitting: General Surgery

## 2022-04-07 DIAGNOSIS — L97522 Non-pressure chronic ulcer of other part of left foot with fat layer exposed: Secondary | ICD-10-CM | POA: Diagnosis not present

## 2022-04-07 DIAGNOSIS — L89623 Pressure ulcer of left heel, stage 3: Secondary | ICD-10-CM | POA: Diagnosis not present

## 2022-04-07 NOTE — Progress Notes (Signed)
[name], [name] ([id number]) Visit Report for 04/07/2022 Chief Complaint Document Details Patient Name: Date of Service: [name] [name]. 04/07/2022 9:30 A M Medical Record Number: [id number] Patient Account Number: [id number] Date of Birth/Sex: Treating RN: 1976/03/25 (46 y.o. M) Primary Care Provider: [name] Other Clinician: Referring Provider: Treating Provider/Extender: [name] in Treatment: 45 Information Obtained from: Patient Chief Complaint Bilateral Plantar Foot Ulcers Electronic Signature(s) Signed: 04/07/2022 10:27:56 AM By: Fredirick Maudlin MD FACS Entered By: Fredirick Maudlin on 04/07/2022 10:27:56 -------------------------------------------------------------------------------- Debridement Details Patient Name: Date of Service: [name], [name] 04/07/2022 9:30 A M Medical Record Number: [id number] Patient Account Number: [id number] Date of Birth/Sex: Treating RN: [birth year] (46 y.o. [name] Primary Care Provider: [name] Other Clinician: Referring Provider: Treating Provider/Extender: [name] in Treatment: 90 Debridement Performed for Assessment: Wound #2 Left Calcaneus Performed By: Physician Fredirick Maudlin, MD Debridement Type: Debridement Level of Consciousness (Pre-procedure): Awake and Alert Pre-procedure Verification/Time Out Yes - 10:25 Taken: Start Time: 10:25 Pain Control: Lidocaine 4% T opical Solution T Area Debrided (L x W): otal 0.6 (cm) x 0.5 (cm) = 0.3 (cm) Tissue and other material debrided: Non-Viable, Slough, Slough Level: Non-Viable Tissue Debridement Description: Selective/Open Wound Instrument: Curette Bleeding: Minimum Hemostasis Achieved: Pressure End Time: 10:26 Procedural Pain: 0 Post Procedural Pain: 0 Response to Treatment: Procedure was tolerated well Level of Consciousness (Post- Awake and Alert procedure): Post Debridement Measurements of Total  Wound Length: (cm) 0.6 Stage: Category/Stage III Width: (cm) 0.5 Depth: (cm) 0.2 Volume: (cm) 0.047 Character of Wound/Ulcer Post Debridement: Improved Post Procedure Diagnosis Same as Pre-procedure Electronic Signature(s) Signed: 04/07/2022 11:00:44 AM By: Fredirick Maudlin MD FACS Signed: 04/07/2022 5:12:10 PM By: [name] Entered By: [name] on 04/07/2022 10:27:15 -------------------------------------------------------------------------------- HPI Details Patient Name: Date of Service: [name], [name]. 04/07/2022 9:30 A M Medical Record Number: [id number] Patient Account Number: [id number] Date of Birth/Sex: Treating RN: 11/21/1975 (46 y.o. M) Primary Care Provider: [name] Other Clinician: Referring Provider: Treating Provider/Extender: [name] in Treatment: 53 History of Present Illness HPI Description: Wounds are12/03/2020 upon evaluation today patient presents for initial inspection here in our clinic concerning issues he has been having with the bottoms of his feet bilaterally. He states these actually occurred as wounds when he was hospitalized for 5 months secondary to Covid. He was apparently with tilting bed where he was in an upright position quite frequently and apparently this occurred in some way shape or form during that time. Fortunately there is no sign of active infection at this time. No fevers, chills, nausea, vomiting, or diarrhea. With that being said he still has substantial wounds on the plantar aspects of his feet Theragen require quite a bit of work to get these to heal. He has been using Santyl currently though that is been problematic both in receiving the medication as well as actually paid for it as it is become quite expensive. Prior to the experience with Covid the patient really did not have any major medical problems other than hypertension he does have some mild generalized weakness following the Covid  experience. 07/22/2020 on evaluation today patient appears to be doing okay in regard to his foot ulcers I feel like the wound beds are showing signs of better improvement that I do believe the Iodoflex is helping in this regard. With that being said he does have a lot of drainage currently and this is somewhat  blue/green in nature which is consistent with Pseudomonas. I do think a culture today would be appropriate for [place] to evaluate and see if that is indeed the case I would likely start him on antibiotic orally as well he is not allergic to Cipro knows of no issues he has had in the past 12/21; patient was admitted to the clinic earlier this month with bilateral presumed pressure ulcers on the bottom of his feet apparently related to excessive pressure from a tilt table arrangement in the intensive care unit. Patient relates this to being on ECMO but I am not really sure that is exactly related to that. I must say I have never seen anything like this. He has fairly extensive full-thickness wounds extending from his heel towards his midfoot mostly centered laterally. There is already been some healing distally. He does not appear to have an arterial issue. He has been using gentamicin to the wound surfaces with Iodoflex to help with ongoing debridement 1/6; this is a patient with pressure ulcers on the bottom of his feet related to excessive pressure from a standing position in the intensive care unit. He is complaining of a lot of pain in the right heel. He is not a diabetic. 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 We have been using Iodoflex to help prepare the surfaces of both wounds for an advanced treatment product. He is nonambulatory spending most of his time in a wheelchair I have asked him not to propel the wheelchair with his heels 1/13; in general his wounds look better not much surface area change we have been using Iodoflex as of last week. I did an x-ray of  the right heel as the patient was complaining of pain. I had some thoughts about a stress fracture perhaps Achilles tendon problems however what it showed was erosive changes along the inferior aspect of the calcaneus he now has a MRI booked for 1/20. 1/20; in general his wounds continue to be better. Some improvement in the large narrow areas proximally in his foot. He is still complaining of pain in the right heel and tenderness in certain areas of this wound. His MRI is tonight. I am not just looking for osteomyelitis that was brought up on the x-ray I am wondering about stress fractures, tendon ruptures etc. He has no such findings on the left. Also noteworthy is that the patient had critical illness neuropathy and some of the discomfort may be actual improvement in nerve function I am just not sure. These wounds were initially in the setting of severe critical illness related to COVID-19. He was put in a standing position. He may have also been on pressors at the point contributing to tissue ischemia. By his description at some point these wounds were grossly necrotic extending proximally up into the Achilles part of his heel. I do not know that I have ever really seen pictures of them like this although they may exist in epic We have ordered Tri layer Oasis. I am trying to stimulate some granulation in these areas. This is of course assuming the MRI is negative for infection 1/27; since the patient was last here he saw Dr. Juleen China of infectious disease. He is planned for vancomycin and ceftriaxone. Prior operative culture grew MSSA. Also ordered baseline lab work. He also ordered arterial studies although the ABIs in our clinic were normal as well as his clinical exam these were normal I do not think he needs to see vascular  surgery. His ABIs at the [facility] were 1.22 in the right triphasic waveforms with a normal TBI of 1.15 on the left ABI of 1.22 with triphasic waveforms and a normal TBI of  1.08. Finally he saw Dr. Amalia Hailey who will follow him in for 2 months. At this point I do not think he felt that he needed a procedure on the right calcaneal bone. Dr. Juleen China is elected for broad-spectrum antibiotic The patient is still having pain in the right heel. He walks with a walker 2/3; wounds are generally smaller. He is tolerating his IV antibiotics. I believe this is vancomycin and ceftriaxone. We are still waiting for Oasis burn in terms of his out-of-pocket max which he should be meeting soon given the IV antibiotics, MRIs etc. I have asked him to check in on this. We are using silver collagen in the meantime the wounds look better 2/10; tolerating IV vancomycin and Rocephin. We are waiting to apply for Oasis. Although I am not really sure where he is in his out-of-pocket max. 2/17 started the first application of Oasis trilayer. Still on antibiotics. The wounds have generally look better. The area on the left has a little more surface slough requiring debridement [id number]; second application of Oasis trilayer. The wound surface granulation is generally look better. The area on the left with undermining laterally I think is come in a bit. 10/08/2020 upon evaluation today patient is here today for [employer] application #3. Fortunately he seems to be doing extremely well with regard to this and we are seeing a lot of new epithelial growth which is great news. Fortunately there is no signs of active infection at this time. 10/16/2020 upon evaluation today patient appears to be doing well with regard to his foot ulcers. Do believe the Oasis has been of benefit for him. I do not see any signs of infection right now which is great news and I think that he has a lot of new epithelial growth which is great to see as well. The patient is very pleased to hear all of this. I do think we can proceed with the Oasis trilayer #4 today. 3/18; not as much improvement in these areas on his heels that I was  hoping. I did reapply trilateral Oasis today the tissue looks healthier but not as much fill in as I was hoping. 3/25; better looking today I think this is come in a bit the tissue looks healthier. Triple layer Oasis reapplied #6 4/1; somewhat better looking definitely better looking surface not as much change in surface area as I was hoping. He may be spending more time [place] on days then he needs to although he does have heel offloading boots. Triple layer Oasis reapplied #7 4/7; unfortunately apparently [facility] will not approve any further Oasis which is unfortunate since the patient did respond nicely both in terms of the condition of the wound bed as well as surface area. There is still some drainage coming from the wound but not a lot there does not appear to be any infection 4/15; we have been using Hydrofera Blue. He continues to have nice rims of epithelialization on the right greater than the left. The left the epithelialization is coming from the tip of his heel. There is moderate drainage. In this that concerns me about a total contact cast. There is no evidence of infection 4/29; patient has been using Hydrofera Blue with dressing changes. He has no complaints or issues today. 5/5;  using Hydrofera Blue. I actually think that he looks marginally better than the last time I saw this 3 weeks ago. There are rims of epithelialization on the left thumb coming from the medial side on the right. Using Hydrofera Blue 5/12; using Hydrofera Blue. These continue to make improvements in surface area. His drainage was not listed as severe I therefore went ahead and put a cast on the left foot. Right foot we will continue to dress his previous 5/16; back for first total contact cast change. He did not tolerate this particularly well cast injury on the anterior tibia among other issues. Difficulty sleeping. I talked him about this in some detail and afterwards is elected to continue. I  told him I would like to have a cast on for 3 weeks to see if this is going to help at all. I think he agreed 5/19; I think the wound is better. There is no tunneling towards his midfoot. The undermining medially also looks better. He has a rim of new skin distally. I think we are making progress here. The area on the left also continues to look somewhat better to me using Hydrofera Blue. He has a list of complaints about the cast but none of them seem serious 5/26; patient presents for 1 week follow-up. He has been using a total contact cast and tolerating this well. Hydrofera Blue is the main dressing used. He denies signs of infection. 6/2 Hydrofera Blue total contact cast on the left. These were large ulcers that formed in intensive care unit where the patient was recovering from [place]. May have had something to do with being ventilated in an upright positiono Pressors etc. We have been able to get the areas down considerably and a viable surface. There is some epithelialization in both sides. Note made of drainage 6/9; changed to [facility] last time because of drainage. He arrives with better looking surfaces and dimensions on the left than the right. Paradoxically the right actually probes more towards his midfoot the left is largely close down but both of these look improved. Using a total contact cast on the left 6/16; complex wounds on his bilateral plantar heels which were initially pressure injury from a stay in the ICU with COVID. We have been using silver alginate most recently. His dimensions of come in quite dramatically however not recently. We have been putting the left foot in a total contact cast 6/23; complex wounds on the bilateral plantar heels. I been putting the left in the cast paradoxically the area on the right is the one that is going towards closure at a faster rate. Quite a bit of drainage on the left. The patient went to see Dr. Amalia Hailey who said he was going to  standby for skin grafts. I had actually considered sending him for skin grafts however he would be mandatorily off his feet for a period of weeks to months. I am thinking that the area on the right is going to close on its own the area on the left has been more stubborn even though we have him in a total contact cast 6/30; took him out of a total contact cast last week is the right heel seem to be making better progress than the left where I was placing the cast. We are using silver alginate. Both wounds are smaller right greater than left 7/12; both wounds look as though they are making some progress. We are using silver alginate. Heel offloading boots 7/26; very  gradual progress especially on the right. Using silver alginate. He is wearing heel offloading boots 8/18; he continues to close these wounds down very gradually. Using silver alginate. The problem polymen being definitive about this is areas of what appears to be callus around the margins. This is not a 100% of the area but certainly sizable especially on the right 9/1; bilateral plantar feet wounds secondary to prolonged pressure while being ventilated for COVID-19 in an upright position. Essentially pressure ulcers on the bottom of his feet. He is made substantial progress using silver alginate. 9/14; bilateral plantar feet wounds secondary to prolonged pressure. Making progress using silver alginate. 9/29 bilateral plantar feet wounds secondary to prolonged pressure. I changed him to Iodoflex last week. MolecuLight showing reddened blush fluorescence 10/11; patient presents for follow-up. He has no issues or complaints today. He denies signs of infection. He continues to use Iodoflex and antibiotic ointment to the wound beds. 10/27; 2-week follow-up. No evidence of infection. He has callus and thick dry skin around the wound margins we have been using Iodoflex and Bactroban which was in response to a moderate left MolecuLight reddish  blush fluorescence. 11/10; 2-week follow-up. Wound margins again have thick callus however the measurements of the actual wound sites are a lot smaller. Everything looks reasonably healthy here. We have been using Iodoflex He was approved for prime matrix but I have elected to delay this given the improvement in the surface area. Hopefully I will not regret that decision as were getting close to the end of the year in terms of insurance payment 12/8; 2-week follow-up. Wounds are generally smaller in size. These were initially substantial wounds extending into the forefoot all the way into the heel on the bilateral plantar feet. They are now both located on the plantar heel distal aspect both of these have a lot of callus around the wounds I used a #5 curette to remove this on the right and the left also some subcutaneous debris to try and get the wound edges were using Iodoflex. He has heel offloading shoe 12/22; 2-week follow-up. Not really much improvement. He has thick callus around the outer edges of both wounds. I remove this there is some nonviable subcutaneous tissue as well. We have been using Iodoflex. Her intake nurse and myself spontaneously thought of a total contact cast I went back in May. At that time we really were not seeing much of an improvement with a cast although the wound was in a much different situation I would like to retry this in 2 weeks and I discussed this with the patient 08/12/2021; the patient has had some improvement with the Iodoflex. The the area on the left heel plantar more improved than the right. I had to put him in a total contact cast on the left although I decided to put that off for 2 weeks. I am going to change his primary dressing to silver collagen. I think in both areas he has had some improvement most of the healing seems to be more proximal in the heel. The wounds are in the mid aspect. A lot of thick callus on the right heel however. 1/19; we are using  silver collagen on both plantar heel areas. He has had some improvement today. The left did not require any debridement. He still had some eschar on the right that was debrided but both seem to have contracted. I did not put it total contact cast on him today 2/2 we have been using  silver collagen. The area on the right plantar heel has areas that appear to be epithelialized interspersed with dry flaking callus and dry skin. I removed this. This really looks better than on the other side. On the left still a large area with raised edges and debris on the surface. 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 He has no issues or complaints today. 2/9; not much change in the left foot wound with 1 week of a cast we are using silver collagen. Silver collagen on the right side. The right side has been the better wound surface. We will reapply the total contact cast on the left 2/16; not much improvement on either side I been using silver collagen with a total contact cast on the left. I'm changing the Hydrofera Blue still with a total contact cast on the left 2/23; some improvement on both sides. Disappointing that he has thick callus around the area that we are putting in a total contact cast on the left. We've been using Hydrofera Blue on both wound areas. This is a man who at essentially pressure ulcers in addition to ischemia caused by medications to support his blood pressure (pressors) in the ICU. He was being ventilated in the standing position for severe Covid. A [place] the wounds extended across his entire foot but are now localized to his plantar heels bilaterally. We have made progress however neither areas healed. I continue to think the total contact cast is helped albeit painstakingly slowly. He has never wanted a plastic surgery consult although I don't know that  they would be interested in grafting in area in this location. 10/07/2021: Continued improvement bilaterally. There is still some callus around the left wound, despite the total contact cast. He has some increased pain in his right midfoot around 1 particular area. This has been painful in the past but seems to be a little bit worse. When his cast was removed today, there was an area on the heel of the left foot that looks a bit macerated. He is also complaining of pain in his left thigh and hip which he thinks is secondary to the limb length discrepancy caused by the cast. 10/14/2021: He continues to improve. A little bit less callus around the left wound. He continues to endorse pain in his right midfoot, but this is not as significant as it was last week. The maceration on his left heel is improved. 10/21/2021: Continued improvement to both wounds. The maceration on his left heel is no longer evident. Less callus bilaterally. Epithelialization progressing. 10/28/2021: Significant improvement this week. The right sided wound is nearly closed with just a small open area at the middle. No maceration seen on the left heel. Continued epithelialization on both sides. No concern for infection. 11/04/2021: T oday, the wounds were measured a little bit differently and come out as larger, but I actually think they are about the same to potentially even smaller, particularly on the left. He continues to accumulate some callus on the right. 11/11/2021: T oday, the patient is expressing some concern that the left wound, despite being in the total contact cast, is not progressing at the same rate as the 1 on the right. He is interested in trying a week without the cast to see how the wound does. The wounds are roughly the same size as last week, with  the right perhaps being a little bit smaller. He continues to build up callus on both sites. 11/18/2021: Last week, I permitted the patient to go without his total contact  cast, just to see if the cast was really making any difference. Today, both wounds have deteriorated to some extent, suggesting that the cast is providing benefit, at least on the left. Both are larger and have accumulated callus, slough, and other debris. 11/26/2021: I debrided both wounds quite aggressively last week in an effort to stimulate the healing cascade. This appears to have been effective as the left sided wound is a full centimeter shorter in length. Although the right was measured slightly larger, on inspection, it looks as though an area of epithelialized tissue was included in the measurements. We have been using PolyMem Ag on the wound surfaces with a total contact cast to the left. 12/02/2021: It appears that the intake personnel are including epithelialized tissue in his wound measurements; the right wound is almost completely epithelialized; there is just a crater at the proximal midfoot with some open areas. On the left, he has built up some callus, but the overall wound surface looks good. There is some senescent skin around the wound margin. He has been in PolyMem Ag bilaterally with a total contact cast on the left. 12/09/2021: The right wound is nearly closed; there is just a small open area at the mid calcaneus. On the left, the wound is smaller with minimal callus buildup. No significant drainage. 12/16/2021: The right calcaneal wound remains minimally open at the mid calcaneus; the rest has epithelialized. On the left, the wound is also a little bit smaller. There is some senescent tissue on the periphery. He is getting his first application of a trial skin substitute called Vendaje today. 12/23/2021: The wound on his right calcaneus is nearly closed; there is just a small area at the most distal aspect of the calcaneus that is open. On the left, the area where we applied to the skin substitute has a healthier-looking bed of granulation tissue. The wound dimensions are not  significantly different on this side but the wound surface is improved. 12/30/2021: The wound on the right calcaneus has not changed significantly aside from some accumulation of callus. On the left, the open area is smaller and continues to have an improved surface. He continues to accumulate callus around the wound. He is here for his third application of Vendaje. 01/06/2022: The right calcaneal wound is down to just a couple of millimeters. He continues to accumulate periwound callus. He unfortunately got his cast wet earlier in the week and his left foot is macerated, resulting in some superficial skin loss just distal to the open wound. The open wound itself, however, is much smaller and has a healthier appearing surface. He is here for his fourth application of Vendaje. 01/13/2022: The right calcaneal wound is about the same. Unfortunately, once again, his cast got wet and his foot is again macerated. This is caused the left calcaneal wound to enlarge. He is here for his fifth application of Vendaje. 01/20/2022: The right calcaneal wound is very small. There is some periwound callus accumulation. He purchased a new cast protector last week and this has been effective in avoiding the maceration that has been occurring on the left. The left calcaneal wound is narrower and has a healthy and viable-appearing surface. He is here for his 6 application of Vendaje. 01/27/2022: The right calcaneal wound is down to just a pinhole. There  is some periwound slough and callus. On the left, the wound is narrower and shorter by about a centimeter. The surface is robust and viable-appearing. Unfortunately, the rep for the trial skin substitute product did not provide any for [place] to use today. 02/04/2022: The right calcaneal wound remains unchanged. There is more accumulated callus. On the left, although the intake nurse measured it a little bit longer, it looks about the same to me. The surface has a layer of slough,  but underneath this, there is good granulation tissue. 02/10/2022: The right calcaneus wound is nearly closed. There is still some callus that builds up around the site. The left side looks about the same in terms of dimensions, but the surface is more robust and vital-appearing. 02/16/2022: The area of the right calcaneus that was nearly closed last week has closed, but there is a small opening at the mid foot where it looks like some moisture got retained and caused some reopening. The left foot wound is narrower and shallower. Both sites have a fair amount of periwound callus and eschar. 02/24/2022: The small midfoot opening on the right calcaneus is a little bit smaller today. The left foot wound is narrower and shallower. He continues to accumulate periwound callus. No concern for infection. 03/01/2022: The patient came to clinic early because he showered and got his cast wet. Fortunately, there is no significant maceration to his foot but the callus softened and it looks like the wound on his left calcaneus may be a little bit wider. The wound on his right calcaneus is just a narrow slit. Continued accumulation of periwound callus bilaterally. 03/08/2022: The wound on his right calcaneus is very nearly closed, just a small pinpoint opening under a bit of eschar; the left wound has come in quite a bit, as well. It is narrower and shorter than at our last visit. Still with accumulated callus and eschar bilaterally. 03/17/2022: The right calcaneal wound is healed. The left wound is smaller and the surface itself is very clean, but there is some blue-green staining on the periwound callus, concerning for Pseudomonas aeruginosa. 03/23/2022: The right calcaneal wound remains closed. The left wound continues to contract. No further blue-green staining. Small amount of callus and slough accumulation. 03/28/2022: He came in early today because he had gotten his cast wet. On inspection, the wound itself did not  get wet or macerated, just a little bit of the forefoot. The wound itself is basically unchanged. 04/07/2022: The right foot wound remains closed. The left wound is the smallest that I have seen it to date. It is narrower and shorter. It still continues to accumulate slough on the surface. Electronic Signature(s) Signed: 04/07/2022 10:28:34 AM By: Fredirick Maudlin MD FACS Entered By: Fredirick Maudlin on 04/07/2022 10:28:34 -------------------------------------------------------------------------------- Physical Exam Details Patient Name: Date of Service: [name], [name] 04/07/2022 9:30 A M Medical Record Number: [id number] Patient Account Number: [id number] Date of Birth/Sex: Treating RN: [birth year] (46 y.o. M) Primary Care Provider: [name] Other Clinician: Referring Provider: Treating Provider/Extender: [name] in Treatment: 67 Constitutional Hypertensive, asymptomatic. . . . No acute distress.Marland Kitchen Respiratory Normal work of breathing on room air.. Notes 04/07/2022: The right foot wound remains closed. The left wound is the smallest that I have seen it to date. It is narrower and shorter. It still continues to accumulate slough on the surface. Electronic Signature(s) Signed: 04/07/2022 10:29:50 AM By: Fredirick Maudlin MD FACS Entered By: Fredirick Maudlin on 04/07/2022 10:29:50 --------------------------------------------------------------------------------  Physician Orders Details Patient Name: Date of Service: [name] 04/07/2022 9:30 A M Medical Record Number: [id number] Patient Account Number: [id number] Date of Birth/Sex: Treating RN: 15-May-1976 (46 y.o. [name] Primary Care Provider: [name] Other Clinician: Referring Provider: Treating Provider/Extender: [name] in Treatment: 59 Verbal / Phone Orders: No Diagnosis Coding ICD-10 Coding Code Description [phone] Non-pressure chronic ulcer of  other part of left foot with fat layer exposed Follow-up Appointments ppointment in 1 week. - Dr [name] Room 3 Friday 04/15/22 at 9:15am and Thursday 9/14 at 9:30am Return A Anesthetic Wound #2 Left Calcaneus (In clinic) Topical Lidocaine 4% applied to wound bed - In clinic Bathing/ Shower/ Hygiene May shower with protection but do not get wound dressing(s) wet. - Cover with cast protector (can purchase cast protector at [facility] or [facility] ) Edema Control - Lymphedema / SCD / Other Bilateral Lower Extremities Elevate legs to the level of the heart or above for 30 minutes daily and/or when sitting, a frequency of: - throughout the day Avoid standing for long periods of time. Moisturize legs daily. - right leg and foot every night. Off-Loading Total Contact Cast to Left Lower Extremity Other: - keep pressure off of the bottom of your feet Additional Orders / Instructions Follow Nutritious Diet Wound Treatment Wound #2 - Calcaneus Wound Laterality: Left Cleanser: Normal Saline (Generic) 1 x Per Week/30 Days Discharge Instructions: Cleanse the wound with Normal Saline prior to applying a clean dressing using gauze sponges, not tissue or cotton balls. Cleanser: Wound Cleanser 1 x Per Week/30 Days Discharge Instructions: Cleanse the wound with wound cleanser prior to applying a clean dressing using gauze sponges, not tissue or cotton balls. Prim Dressing: Endoform 2x2 in 1 x Per Week/30 Days ary Discharge Instructions: Moisten with saline Secondary Dressing: Optifoam Non-Adhesive Dressing, 4x4 in (Generic) 1 x Per Week/30 Days Discharge Instructions: Apply over primary dressing as directed. Secondary Dressing: Woven Gauze Sponge, Non-Sterile 4x4 in 1 x Per Week/30 Days Discharge Instructions: Apply over primary dressing as directed. Secured With: 18M Medipore H Soft Cloth Surgical T ape, 4 x 10 (in/yd) (Generic) 1 x Per Week/30 Days Discharge Instructions: Secure with tape as  directed. Electronic Signature(s) Signed: 04/07/2022 12:18:52 PM By: Fredirick Maudlin MD FACS Signed: 04/07/2022 5:12:10 PM By: [name] Previous Signature: 04/07/2022 11:13:47 AM Version By: Fredirick Maudlin MD FACS Entered By: [name] on 04/07/2022 11:14:52 -------------------------------------------------------------------------------- Problem List Details Patient Name: Date of Service: [name], [place] 04/07/2022 9:30 A M Medical Record Number: [id number] Patient Account Number: [id number] Date of Birth/Sex: Treating RN: [birth year] (46 y.o. M) Primary Care Provider: [name] Other Clinician: Referring Provider: Treating Provider/Extender: [name] in Treatment: 69 Active Problems ICD-10 Encounter Code Description Active Date MDM Diagnosis L97.522 Non-pressure chronic ulcer of other part of left foot with fat layer exposed 09/03/2020 No Yes Inactive Problems ICD-10 Code Description Active Date Inactive Date L97.512 Non-pressure chronic ulcer of other part of right foot with fat layer exposed 09/03/2020 09/03/2020 L89.893 Pressure ulcer of other site, stage 3 07/15/2020 07/15/2020 M62.81 Muscle weakness (generalized) 07/15/2020 07/15/2020 I10 Essential (primary) hypertension 07/15/2020 07/15/2020 M86.171 Other acute osteomyelitis, right ankle and foot 09/03/2020 09/03/2020 Resolved Problems Electronic Signature(s) Signed: 04/07/2022 10:27:42 AM By: Fredirick Maudlin MD FACS Entered By: Fredirick Maudlin on 04/07/2022 10:27:42 -------------------------------------------------------------------------------- Progress Note Details Patient Name: Date of Service: [name], [place] E. 04/07/2022 9:30 A M Medical Record Number: [id number] Patient  Account Number: [id number] Date of Birth/Sex: Treating RN: [birth year] (46 y.o. M) Primary Care Provider: [name] Other Clinician: Referring Provider: Treating Provider/Extender: [name] in Treatment: 25 Subjective Chief Complaint Information obtained from Patient Bilateral Plantar Foot Ulcers History of Present Illness (HPI) Wounds are12/03/2020 upon evaluation today patient presents for initial inspection here in our clinic concerning issues he has been having with the bottoms of his feet bilaterally. He states these actually occurred as wounds when he was hospitalized for 5 months secondary to Covid. He was apparently with tilting bed where he was in an upright position quite frequently and apparently this occurred in some way shape or form during that time. Fortunately there is no sign of active infection at this time. No fevers, chills, nausea, vomiting, or diarrhea. With that being said he still has substantial wounds on the plantar aspects of his feet Theragen require quite a bit of work to get these to heal. He has been using Santyl currently though that is been problematic both in receiving the medication as well as actually paid for it as it is become quite expensive. Prior to the experience with Covid the patient really did not have any major medical problems other than hypertension he does have some mild generalized weakness following the Covid experience. 07/22/2020 on evaluation today patient appears to be doing okay in regard to his foot ulcers I feel like the wound beds are showing signs of better improvement that I do believe the Iodoflex is helping in this regard. With that being said he does have a lot of drainage currently and this is somewhat blue/green in nature which is consistent with Pseudomonas. I do think a culture today would be appropriate for [place] to evaluate and see if that is indeed the case I would likely start him on antibiotic orally as well he is not allergic to Cipro knows of no issues he has had in the past 12/21; patient was admitted to the clinic earlier this month with bilateral presumed pressure ulcers on the bottom of his feet  apparently related to excessive pressure from a tilt table arrangement in the intensive care unit. Patient relates this to being on ECMO but I am not really sure that is exactly related to that. I must say I have never seen anything like this. He has fairly extensive full-thickness wounds extending from his heel towards his midfoot mostly centered laterally. There is already been some healing distally. He does not appear to have an arterial issue. He has been using gentamicin to the wound surfaces with Iodoflex to help with ongoing debridement 1/6; this is a patient with pressure ulcers on the bottom of his feet related to excessive pressure from a standing position in the intensive care unit. He is complaining of a lot of pain in the right heel. He is not a diabetic. 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 We have been using Iodoflex to help prepare the surfaces of both wounds for an advanced treatment product. He is nonambulatory spending most of his time in a wheelchair I have asked him not to propel the wheelchair with his heels 1/13; in general his wounds look better not much surface area change we have been using Iodoflex as of last week. I did an x-ray of the right heel as the patient was complaining of pain. I had some thoughts about a stress fracture perhaps Achilles tendon problems however what it showed was erosive changes along  the inferior aspect of the calcaneus he now has a MRI booked for 1/20. 1/20; in general his wounds continue to be better. Some improvement in the large narrow areas proximally in his foot. He is still complaining of pain in the right heel and tenderness in certain areas of this wound. His MRI is tonight. I am not just looking for osteomyelitis that was brought up on the x-ray I am wondering about stress fractures, tendon ruptures etc. He has no such findings on the left. Also noteworthy is that the patient had critical illness neuropathy and  some of the discomfort may be actual improvement in nerve function I am just not sure. These wounds were initially in the setting of severe critical illness related to COVID-19. He was put in a standing position. He may have also been on pressors at the point contributing to tissue ischemia. By his description at some point these wounds were grossly necrotic extending proximally up into the Achilles part of his heel. I do not know that I have ever really seen pictures of them like this although they may exist in epic We have ordered Tri layer Oasis. I am trying to stimulate some granulation in these areas. This is of course assuming the MRI is negative for infection 1/27; since the patient was last here he saw Dr. Juleen China of infectious disease. He is planned for vancomycin and ceftriaxone. Prior operative culture grew MSSA. Also ordered baseline lab work. He also ordered arterial studies although the ABIs in our clinic were normal as well as his clinical exam these were normal I do not think he needs to see vascular surgery. His ABIs at the [facility] were 1.22 in the right triphasic waveforms with a normal TBI of 1.15 on the left ABI of 1.22 with triphasic waveforms and a normal TBI of 1.08. Finally he saw Dr. Amalia Hailey who will follow him in for 2 months. At this point I do not think he felt that he needed a procedure on the right calcaneal bone. Dr. Juleen China is elected for broad-spectrum antibiotic The patient is still having pain in the right heel. He walks with a walker 2/3; wounds are generally smaller. He is tolerating his IV antibiotics. I believe this is vancomycin and ceftriaxone. We are still waiting for Oasis burn in terms of his out-of-pocket max which he should be meeting soon given the IV antibiotics, MRIs etc. I have asked him to check in on this. We are using silver collagen in the meantime the wounds look better 2/10; tolerating IV vancomycin and Rocephin. We are waiting to apply for Oasis.  Although I am not really sure where he is in his out-of-pocket max. 2/17 started the first application of Oasis trilayer. Still on antibiotics. The wounds have generally look better. The area on the left has a little more surface slough requiring debridement [id number]; second application of Oasis trilayer. The wound surface granulation is generally look better. The area on the left with undermining laterally I think is come in a bit. 10/08/2020 upon evaluation today patient is here today for [employer] application #3. Fortunately he seems to be doing extremely well with regard to this and we are seeing a lot of new epithelial growth which is great news. Fortunately there is no signs of active infection at this time. 10/16/2020 upon evaluation today patient appears to be doing well with regard to his foot ulcers. Do believe the Oasis has been of benefit for him. I do not see any  signs of infection right now which is great news and I think that he has a lot of new epithelial growth which is great to see as well. The patient is very pleased to hear all of this. I do think we can proceed with the Oasis trilayer #4 today. 3/18; not as much improvement in these areas on his heels that I was hoping. I did reapply trilateral Oasis today the tissue looks healthier but not as much fill in as I was hoping. 3/25; better looking today I think this is come in a bit the tissue looks healthier. Triple layer Oasis reapplied #6 4/1; somewhat better looking definitely better looking surface not as much change in surface area as I was hoping. He may be spending more time [place] on days then he needs to although he does have heel offloading boots. Triple layer Oasis reapplied #7 4/7; unfortunately apparently [facility] will not approve any further Oasis which is unfortunate since the patient did respond nicely both in terms of the condition of the wound bed as well as surface area. There is still some drainage  coming from the wound but not a lot there does not appear to be any infection 4/15; we have been using Hydrofera Blue. He continues to have nice rims of epithelialization on the right greater than the left. The left the epithelialization is coming from the tip of his heel. There is moderate drainage. In this that concerns me about a total contact cast. There is no evidence of infection 4/29; patient has been using Hydrofera Blue with dressing changes. He has no complaints or issues today. 5/5; using Hydrofera Blue. I actually think that he looks marginally better than the last time I saw this 3 weeks ago. There are rims of epithelialization on the left thumb coming from the medial side on the right. Using Hydrofera Blue 5/12; using Hydrofera Blue. These continue to make improvements in surface area. His drainage was not listed as severe I therefore went ahead and put a cast on the left foot. Right foot we will continue to dress his previous 5/16; back for first total contact cast change. He did not tolerate this particularly well cast injury on the anterior tibia among other issues. Difficulty sleeping. I talked him about this in some detail and afterwards is elected to continue. I told him I would like to have a cast on for 3 weeks to see if this is going to help at all. I think he agreed 5/19; I think the wound is better. There is no tunneling towards his midfoot. The undermining medially also looks better. He has a rim of new skin distally. I think we are making progress here. The area on the left also continues to look somewhat better to me using Hydrofera Blue. He has a list of complaints about the cast but none of them seem serious 5/26; patient presents for 1 week follow-up. He has been using a total contact cast and tolerating this well. Hydrofera Blue is the main dressing used. He denies signs of infection. 6/2 Hydrofera Blue total contact cast on the left. These were large ulcers that  formed in intensive care unit where the patient was recovering from [place]. May have had something to do with being ventilated in an upright positiono Pressors etc. We have been able to get the areas down considerably and a viable surface. There is some epithelialization in both sides. Note made of drainage 6/9; changed to [facility] last  time because of drainage. He arrives with better looking surfaces and dimensions on the left than the right. Paradoxically the right actually probes more towards his midfoot the left is largely close down but both of these look improved. Using a total contact cast on the left 6/16; complex wounds on his bilateral plantar heels which were initially pressure injury from a stay in the ICU with COVID. We have been using silver alginate most recently. His dimensions of come in quite dramatically however not recently. We have been putting the left foot in a total contact cast 6/23; complex wounds on the bilateral plantar heels. I been putting the left in the cast paradoxically the area on the right is the one that is going towards closure at a faster rate. Quite a bit of drainage on the left. The patient went to see Dr. Logan Bores who said he was going to standby for skin grafts. I had actually considered sending him for skin grafts however he would be mandatorily off his feet for a period of weeks to months. I am thinking that the area on the right is going to close on its own the area on the left has been more stubborn even though we have him in a total contact cast 6/30; took him out of a total contact cast last week is the right heel seem to be making better progress than the left where I was placing the cast. We are using silver alginate. Both wounds are smaller right greater than left 7/12; both wounds look as though they are making some progress. We are using silver alginate. Heel offloading boots 7/26; very gradual progress especially on the right. Using silver  alginate. He is wearing heel offloading boots 8/18; he continues to close these wounds down very gradually. Using silver alginate. The problem polymen being definitive about this is areas of what appears to be callus around the margins. This is not a 100% of the area but certainly sizable especially on the right 9/1; bilateral plantar feet wounds secondary to prolonged pressure while being ventilated for COVID-19 in an upright position. Essentially pressure ulcers on the bottom of his feet. He is made substantial progress using silver alginate. 9/14; bilateral plantar feet wounds secondary to prolonged pressure. Making progress using silver alginate. 9/29 bilateral plantar feet wounds secondary to prolonged pressure. I changed him to Iodoflex last week. MolecuLight showing reddened blush fluorescence 10/11; patient presents for follow-up. He has no issues or complaints today. He denies signs of infection. He continues to use Iodoflex and antibiotic ointment to the wound beds. 10/27; 2-week follow-up. No evidence of infection. He has callus and thick dry skin around the wound margins we have been using Iodoflex and Bactroban which was in response to a moderate left MolecuLight reddish blush fluorescence. 11/10; 2-week follow-up. Wound margins again have thick callus however the measurements of the actual wound sites are a lot smaller. Everything looks reasonably healthy here. We have been using Iodoflex He was approved for prime matrix but I have elected to delay this given the improvement in the surface area. Hopefully I will not regret that decision as were getting close to the end of the year in terms of insurance payment 12/8; 2-week follow-up. Wounds are generally smaller in size. These were initially substantial wounds extending into the forefoot all the way into the heel on the bilateral plantar feet. They are now both located on the plantar heel distal aspect both of these have a lot of callus  around the wounds I used a #5 curette to remove this on the right and the left also some subcutaneous debris to try and get the wound edges were using Iodoflex. He has heel offloading shoe 12/22; 2-week follow-up. Not really much improvement. He has thick callus around the outer edges of both wounds. I remove this there is some nonviable subcutaneous tissue as well. We have been using Iodoflex. Her intake nurse and myself spontaneously thought of a total contact cast I went back in May. At that time we really were not seeing much of an improvement with a cast although the wound was in a much different situation I would like to retry this in 2 weeks and I discussed this with the patient 08/12/2021; the patient has had some improvement with the Iodoflex. The the area on the left heel plantar more improved than the right. I had to put him in a total contact cast on the left although I decided to put that off for 2 weeks. I am going to change his primary dressing to silver collagen. I think in both areas he has had some improvement most of the healing seems to be more proximal in the heel. The wounds are in the mid aspect. A lot of thick callus on the right heel however. 1/19; we are using silver collagen on both plantar heel areas. He has had some improvement today. The left did not require any debridement. He still had some eschar on the right that was debrided but both seem to have contracted. I did not put it total contact cast on him today 2/2 we have been using silver collagen. The area on the right plantar heel has areas that appear to be epithelialized interspersed with dry flaking callus and dry skin. I removed this. This really looks better than on the other side. On the left still a large area with raised edges and debris on the surface. 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 He has no issues or complaints today. 2/9; not much change in the left foot wound with 1 week of a cast we are using silver collagen. Silver collagen on the right side. The right side has been the better wound surface. We will reapply the total contact cast on the left 2/16; not much improvement on either side I been using silver collagen with a total contact cast on the left. I'm changing the Hydrofera Blue still with a total contact cast on the left 2/23; some improvement on both sides. Disappointing that he has thick callus around the area that we are putting in a total contact cast on the left. We've been using Hydrofera Blue on both wound areas. This is a man who at essentially pressure ulcers in addition to ischemia caused by medications to support his blood pressure (pressors) in the ICU. He was being ventilated in the standing position for severe Covid. A Shiley the wounds extended across his entire foot but are now localized to his plantar heels bilaterally. We have made progress however neither areas healed. I continue to think the total contact cast is helped albeit painstakingly slowly. He has never wanted a plastic surgery consult although I don't know that they would be interested in grafting in area in this location. 10/07/2021: Continued improvement bilaterally. There is still some callus around the left wound, despite the total  contact cast. He has some increased pain in his right midfoot around 1 particular area. This has been painful in the past but seems to be a little bit worse. When his cast was removed today, there was an area on the heel of the left foot that looks a bit macerated. He is also complaining of pain in his left thigh and hip which he thinks is secondary to the limb length discrepancy caused by the cast. 10/14/2021: He continues to improve. A little bit less callus around the left wound. He continues to endorse pain in his right midfoot, but this is not  as significant as it was last week. The maceration on his left heel is improved. 10/21/2021: Continued improvement to both wounds. The maceration on his left heel is no longer evident. Less callus bilaterally. Epithelialization progressing. 10/28/2021: Significant improvement this week. The right sided wound is nearly closed with just a small open area at the middle. No maceration seen on the left heel. Continued epithelialization on both sides. No concern for infection. 11/04/2021: T oday, the wounds were measured a little bit differently and come out as larger, but I actually think they are about the same to potentially even smaller, particularly on the left. He continues to accumulate some callus on the right. 11/11/2021: T oday, the patient is expressing some concern that the left wound, despite being in the total contact cast, is not progressing at the same rate as the 1 on the right. He is interested in trying a week without the cast to see how the wound does. The wounds are roughly the same size as last week, with the right perhaps being a little bit smaller. He continues to build up callus on both sites. 11/18/2021: Last week, I permitted the patient to go without his total contact cast, just to see if the cast was really making any difference. Today, both wounds have deteriorated to some extent, suggesting that the cast is providing benefit, at least on the left. Both are larger and have accumulated callus, slough, and other debris. 11/26/2021: I debrided both wounds quite aggressively last week in an effort to stimulate the healing cascade. This appears to have been effective as the left sided wound is a full centimeter shorter in length. Although the right was measured slightly larger, on inspection, it looks as though an area of epithelialized tissue was included in the measurements. We have been using PolyMem Ag on the wound surfaces with a total contact cast to the left. 12/02/2021: It appears  that the intake personnel are including epithelialized tissue in his wound measurements; the right wound is almost completely epithelialized; there is just a crater at the proximal midfoot with some open areas. On the left, he has built up some callus, but the overall wound surface looks good. There is some senescent skin around the wound margin. He has been in PolyMem Ag bilaterally with a total contact cast on the left. 12/09/2021: The right wound is nearly closed; there is just a small open area at the mid calcaneus. On the left, the wound is smaller with minimal callus buildup. No significant drainage. 12/16/2021: The right calcaneal wound remains minimally open at the mid calcaneus; the rest has epithelialized. On the left, the wound is also a little bit smaller. There is some senescent tissue on the periphery. He is getting his first application of a trial skin substitute called Vendaje today. 12/23/2021: The wound on his right calcaneus is nearly closed; there is just  a small area at the most distal aspect of the calcaneus that is open. On the left, the area where we applied to the skin substitute has a healthier-looking bed of granulation tissue. The wound dimensions are not significantly different on this side but the wound surface is improved. 12/30/2021: The wound on the right calcaneus has not changed significantly aside from some accumulation of callus. On the left, the open area is smaller and continues to have an improved surface. He continues to accumulate callus around the wound. He is here for his third application of Vendaje. 01/06/2022: The right calcaneal wound is down to just a couple of millimeters. He continues to accumulate periwound callus. He unfortunately got his cast wet earlier in the week and his left foot is macerated, resulting in some superficial skin loss just distal to the open wound. The open wound itself, however, is much smaller and has a healthier appearing surface. He  is here for his fourth application of Vendaje. 01/13/2022: The right calcaneal wound is about the same. Unfortunately, once again, his cast got wet and his foot is again macerated. This is caused the left calcaneal wound to enlarge. He is here for his fifth application of Vendaje. 01/20/2022: The right calcaneal wound is very small. There is some periwound callus accumulation. He purchased a new cast protector last week and this has been effective in avoiding the maceration that has been occurring on the left. The left calcaneal wound is narrower and has a healthy and viable-appearing surface. He is here for his 6 application of Vendaje. 01/27/2022: The right calcaneal wound is down to just a pinhole. There is some periwound slough and callus. On the left, the wound is narrower and shorter by about a centimeter. The surface is robust and viable-appearing. Unfortunately, the rep for the trial skin substitute product did not provide any for [place] to use today. 02/04/2022: The right calcaneal wound remains unchanged. There is more accumulated callus. On the left, although the intake nurse measured it a little bit longer, it looks about the same to me. The surface has a layer of slough, but underneath this, there is good granulation tissue. 02/10/2022: The right calcaneus wound is nearly closed. There is still some callus that builds up around the site. The left side looks about the same in terms of dimensions, but the surface is more robust and vital-appearing. 02/16/2022: The area of the right calcaneus that was nearly closed last week has closed, but there is a small opening at the mid foot where it looks like some moisture got retained and caused some reopening. The left foot wound is narrower and shallower. Both sites have a fair amount of periwound callus and eschar. 02/24/2022: The small midfoot opening on the right calcaneus is a little bit smaller today. The left foot wound is narrower and shallower. He  continues to accumulate periwound callus. No concern for infection. 03/01/2022: The patient came to clinic early because he showered and got his cast wet. Fortunately, there is no significant maceration to his foot but the callus softened and it looks like the wound on his left calcaneus may be a little bit wider. The wound on his right calcaneus is just a narrow slit. Continued accumulation of periwound callus bilaterally. 03/08/2022: The wound on his right calcaneus is very nearly closed, just a small pinpoint opening under a bit of eschar; the left wound has come in quite a bit, as well. It is narrower and shorter than at our  last visit. Still with accumulated callus and eschar bilaterally. 03/17/2022: The right calcaneal wound is healed. The left wound is smaller and the surface itself is very clean, but there is some blue-green staining on the periwound callus, concerning for Pseudomonas aeruginosa. 03/23/2022: The right calcaneal wound remains closed. The left wound continues to contract. No further blue-green staining. Small amount of callus and slough accumulation. 03/28/2022: He came in early today because he had gotten his cast wet. On inspection, the wound itself did not get wet or macerated, just a little bit of the forefoot. The wound itself is basically unchanged. 04/07/2022: The right foot wound remains closed. The left wound is the smallest that I have seen it to date. It is narrower and shorter. It still continues to accumulate slough on the surface. Patient History Information obtained from Patient. Family History Cancer - Maternal Grandparents, Diabetes - Father,Paternal Grandparents, Heart Disease - Maternal Grandparents, Hypertension - Father,Paternal Grandparents, Lung Disease - Siblings, No family history of Hereditary Spherocytosis, Kidney Disease, Seizures, Stroke, Thyroid Problems, Tuberculosis. Social History Never smoker, Marital Status - Married, Alcohol Use - Never, Drug  Use - No History, Caffeine Use - Daily - tea, soda. Medical History Eyes Denies history of Cataracts, Glaucoma, Optic Neuritis Ear/Nose/Mouth/Throat Denies history of Chronic sinus problems/congestion, Middle ear problems Hematologic/Lymphatic Denies history of Anemia, Hemophilia, Human Immunodeficiency Virus, Lymphedema, Sickle Cell Disease Respiratory Patient has history of Asthma Denies history of Aspiration, Chronic Obstructive Pulmonary Disease (COPD), Pneumothorax, Sleep Apnea, Tuberculosis Cardiovascular Patient has history of Angina - with COVID, Hypertension Denies history of Arrhythmia, Congestive Heart Failure, Coronary Artery Disease, Deep Vein Thrombosis, Hypotension, Myocardial Infarction, Peripheral Arterial Disease, Peripheral Venous Disease, Phlebitis, Vasculitis Gastrointestinal Denies history of Cirrhosis , Colitis, Crohnoos, Hepatitis A, Hepatitis B, Hepatitis C Endocrine Denies history of Type I Diabetes, Type II Diabetes Genitourinary Denies history of End Stage Renal Disease Immunological Denies history of Lupus Erythematosus, Raynaudoos, Scleroderma Integumentary (Skin) Denies history of History of Burn Musculoskeletal Denies history of Gout, Rheumatoid Arthritis, Osteoarthritis, Osteomyelitis Neurologic Denies history of Dementia, Neuropathy, Quadriplegia, Paraplegia, Seizure Disorder Oncologic Denies history of Received Chemotherapy, Received Radiation Psychiatric Denies history of Anorexia/bulimia, Confinement Anxiety Hospitalization/Surgery History - COVID PNA 07/22/2019- 11/14/2019. - 03/27/2020 wound debridement/ skin graft. Medical A Surgical History Notes nd Constitutional Symptoms (General Health) COVID PNA 07/22/2019-11/14/2019 [facility] ECMO, foot drop left foot , Genitourinary kidney stone Psychiatric anxiety Objective Constitutional Hypertensive, asymptomatic. No acute distress.. Vitals Time Taken: 9:55 AM, Height: 69 in, Weight: 280 lbs,  BMI: 41.3, Temperature: 98.6 F, Pulse: 87 bpm, Respiratory Rate: 16 breaths/min, Blood Pressure: 157/83 mmHg. Respiratory Normal work of breathing on room air.. General Notes: 04/07/2022: The right foot wound remains closed. The left wound is the smallest that I have seen it to date. It is narrower and shorter. It still continues to accumulate slough on the surface. Integumentary (Hair, Skin) Wound #2 status is Open. Original cause of wound was Pressure Injury. The date acquired was: 10/07/2019. The wound has been in treatment 90 weeks. The wound is located on the Left Calcaneus. The wound measures 0.6cm length x 0.5cm width x 0.2cm depth; 0.236cm^2 area and 0.047cm^3 volume. There is Fat Layer (Subcutaneous Tissue) exposed. There is no tunneling or undermining noted. There is a medium amount of serosanguineous drainage noted. The wound margin is thickened. There is medium (34-66%) red granulation within the wound bed. There is a medium (34-66%) amount of necrotic tissue within the wound bed including Adherent Slough. Assessment Active Problems ICD-10 Non-pressure  chronic ulcer of other part of left foot with fat layer exposed Procedures Wound #2 Pre-procedure diagnosis of Wound #2 is a Pressure Ulcer located on the Left Calcaneus . There was a Selective/Open Wound Non-Viable Tissue Debridement with a total area of 0.3 sq cm performed by Fredirick Maudlin, MD. With the following instrument(s): Curette to remove Non-Viable tissue/material. Material removed includes [facility] after achieving pain control using Lidocaine 4% Topical Solution. No specimens were taken. A time out was conducted at 10:25, prior to the start of the procedure. A Minimum amount of bleeding was controlled with Pressure. The procedure was tolerated well with a pain level of 0 throughout and a pain level of 0 following the procedure. Post Debridement Measurements: 0.6cm length x 0.5cm width x 0.2cm depth; 0.047cm^3 volume.  Post debridement Stage noted as Category/Stage III. Character of Wound/Ulcer Post Debridement is improved. Post procedure Diagnosis Wound #2: Same as Pre-Procedure Plan 04/07/2022: The right foot wound remains closed. The left wound is the smallest that I have seen it to date. It is narrower and shorter. It still continues to accumulate slough on the surface. The slough was removed from the wound surface. We will continue to apply endoform and total contact casting. Follow-up in 1 week. Electronic Signature(s) Signed: 04/07/2022 10:30:28 AM By: Fredirick Maudlin MD FACS Entered By: Fredirick Maudlin on 04/07/2022 10:30:27 -------------------------------------------------------------------------------- HxROS Details Patient Name: Date of Service: [name], [place]. 04/07/2022 9:30 A M Medical Record Number: [id number] Patient Account Number: [id number] Date of Birth/Sex: Treating RN: 02-03-1976 (46 y.o. M) Primary Care Provider: [name] Other Clinician: Referring Provider: Treating Provider/Extender: [name] in Treatment: 57 Information Obtained From Patient Constitutional Symptoms (General Health) Medical History: Past Medical History Notes: COVID PNA 07/22/2019-11/14/2019 V[facility] ECMO, foot drop left foot , Eyes Medical History: Negative for: Cataracts; Glaucoma; Optic Neuritis Ear/Nose/Mouth/Throat Medical History: Negative for: Chronic sinus problems/congestion; Middle ear problems Hematologic/Lymphatic Medical History: Negative for: Anemia; Hemophilia; Human Immunodeficiency Virus; Lymphedema; Sickle Cell Disease Respiratory Medical History: Positive for: Asthma Negative for: Aspiration; Chronic Obstructive Pulmonary Disease (COPD); Pneumothorax; Sleep Apnea; Tuberculosis Cardiovascular Medical History: Positive for: Angina - with COVID; Hypertension Negative for: Arrhythmia; Congestive Heart Failure; Coronary Artery Disease; Deep Vein Thrombosis;  Hypotension; Myocardial Infarction; Peripheral Arterial Disease; Peripheral Venous Disease; Phlebitis; Vasculitis Gastrointestinal Medical History: Negative for: Cirrhosis ; Colitis; Crohns; Hepatitis A; Hepatitis B; Hepatitis C Endocrine Medical History: Negative for: Type I Diabetes; Type II Diabetes Genitourinary Medical History: Negative for: End Stage Renal Disease Past Medical History Notes: kidney stone Immunological Medical History: Negative for: Lupus Erythematosus; Raynauds; Scleroderma Integumentary (Skin) Medical History: Negative for: History of Burn Musculoskeletal Medical History: Negative for: Gout; Rheumatoid Arthritis; Osteoarthritis; Osteomyelitis Neurologic Medical History: Negative for: Dementia; Neuropathy; Quadriplegia; Paraplegia; Seizure Disorder Oncologic Medical History: Negative for: Received Chemotherapy; Received Radiation Psychiatric Medical History: Negative for: Anorexia/bulimia; Confinement Anxiety Past Medical History Notes: anxiety Immunizations Pneumococcal Vaccine: Received Pneumococcal Vaccination: No Implantable Devices None Hospitalization / Surgery History Type of Hospitalization/Surgery COVID PNA 07/22/2019- 11/14/2019 03/27/2020 wound debridement/ skin graft Family and Social History Cancer: Yes - Maternal Grandparents; Diabetes: Yes - Father,Paternal Grandparents; Heart Disease: Yes - Maternal Grandparents; Hereditary Spherocytosis: No; Hypertension: Yes - Father,Paternal Grandparents; Kidney Disease: No; Lung Disease: Yes - Siblings; Seizures: No; Stroke: No; Thyroid Problems: No; Tuberculosis: No; Never smoker; Marital Status - Married; Alcohol Use: Never; Drug Use: No History; Caffeine Use: Daily - tea, soda; Financial Concerns: No; Food, Clothing or Shelter Needs: No; Support System Lacking: No;  Transportation Concerns: No Electronic Signature(s) Signed: 04/07/2022 11:00:44 AM By: Fredirick Maudlin MD FACS Entered By:  Fredirick Maudlin on 04/07/2022 10:29:20 -------------------------------------------------------------------------------- Total Contact Cast Details Patient Name: Date of Service: [name], [place] 04/07/2022 9:30 A M Medical Record Number: [id number] Patient Account Number: [id number] Date of Birth/Sex: Treating RN: 25-May-1976 (46 y.o. [name] Primary Care Provider: [name] Other Clinician: Referring Provider: Treating Provider/Extender: [name] in Treatment: 47 T Contact Cast Applied for Wound Assessment: otal Wound #2 Left Calcaneus Performed By: Physician Fredirick Maudlin, MD Post Procedure Diagnosis Same as Pre-procedure Electronic Signature(s) Signed: 04/07/2022 11:13:47 AM By: Fredirick Maudlin MD FACS Signed: 04/07/2022 5:12:10 PM By: [name] Entered By: [name] on 04/07/2022 11:05:33 -------------------------------------------------------------------------------- SuperBill Details Patient Name: Date of Service: [name], [place]. 04/07/2022 Medical Record Number: [id number] Patient Account Number: [id number] Date of Birth/Sex: Treating RN: 23-Mar-1976 (46 y.o. M) Primary Care Provider: [name] Other Clinician: Referring Provider: Treating Provider/Extender: [name] in Treatment: 77 Diagnosis Coding ICD-10 Codes Code Description [phone] Non-pressure chronic ulcer of other part of left foot with fat layer exposed Facility Procedures CPT4 Code: [id number] Description: [phone] - APPLY TOTAL CONTACT LEG CAST ICD-10 Diagnosis Description L97.522 Non-pressure chronic ulcer of other part of left foot with fat layer exposed Modifier: Quantity: 1 Physician Procedures : CPT4 Code Description Modifier [id number] - WC PHYS LEVEL 3 - EST PT ICD-10 Diagnosis Description L97.522 Non-pressure chronic ulcer of other part of left foot with fat layer exposed Quantity: 1 : [id number] - WC PHYS  APPLY TOTAL CONTACT CAST ICD-10 Diagnosis Description L97.522 Non-pressure chronic ulcer of other part of left foot with fat layer exposed Quantity: 1 Electronic Signature(s) Signed: 04/07/2022 10:30:49 AM By: Fredirick Maudlin MD FACS Entered By: Fredirick Maudlin on 04/07/2022 10:30:48

## 2022-04-07 NOTE — Progress Notes (Signed)
JAIVEER, PANAS ([id number]) Visit Report for 04/07/2022 Arrival Information Details Patient Name: Date of Service: [name], [name] [name] 04/07/2022 9:30 A M Medical Record Number: [id number] Patient Account Number: [id number] Date of Birth/Sex: Treating RN: 1975-11-16 (46 y.o. [name] Primary Care Alexande Sheerin: Dorinda Hill Other Clinician: Referring Kennice Finnie: Treating Jaevon Paras/Extender: Jana Hakim in Treatment: 76 Visit Information History Since Last Visit Added or deleted any medications: No Patient Arrived: Wheel Chair Any new allergies or adverse reactions: No Arrival Time: 10:10 Had a fall or experienced change in No Accompanied By: spouse activities of daily living that may affect Transfer Assistance: None risk of falls: Patient Identification Verified: Yes Signs or symptoms of abuse/neglect since last visito No Patient Requires Transmission-Based Precautions: No Hospitalized since last visit: No Patient Has Alerts: No Implantable device outside of the clinic excluding No cellular tissue based products placed in the center since last visit: Has Dressing in Place as Prescribed: Yes Pain Present Now: No Electronic Signature(s) Signed: 04/07/2022 5:12:10 PM By: [name] RN Entered By: [name] on 04/07/2022 10:13:17 -------------------------------------------------------------------------------- Encounter Discharge Information Details Patient Name: Date of Service: [name], [name] [place] E. 04/07/2022 9:30 A M Medical Record Number: [id number] Patient Account Number: [id number] Date of Birth/Sex: Treating RN: [birth year] (46 y.o. [name] Primary Care Toryn Mcclinton: Dorinda Hill Other Clinician: Referring Ardath Lepak: Treating Dequan Kindred/Extender: Jana Hakim in Treatment: 65 Encounter Discharge Information Items Post Procedure Vitals Discharge Condition: Stable Temperature (F): 98.6 Ambulatory Status:  Wheelchair Pulse (bpm): 87 Discharge Destination: Home Respiratory Rate (breaths/min): 16 Transportation: Private Auto Blood Pressure (mmHg): 157/86 Accompanied By: spouse Schedule Follow-up Appointment: Yes Clinical Summary of Care: Patient Declined Electronic Signature(s) Signed: 04/07/2022 5:12:10 PM By: [name] RN Entered By: [name] on 04/07/2022 13:21:59 -------------------------------------------------------------------------------- Lower Extremity Assessment Details Patient Name: Date of Service: [name], [name] [name] 04/07/2022 9:30 A M Medical Record Number: [id number] Patient Account Number: [id number] Date of Birth/Sex: Treating RN: [birth year] (46 y.o. [name] Primary Care Rayelynn Loyal: Dorinda Hill Other Clinician: Referring Rosine Solecki: Treating Audery Wassenaar/Extender: Jana Hakim in Treatment: 90 Edema Assessment Assessed: [name]: No] Franne Forts: No] Edema: [Left: N] [Right: o] Calf Left: Right: Point of Measurement: 29 cm From Medial Instep 44 cm 39 cm Ankle Left: Right: Point of Measurement: 9 cm From Medial Instep 25.5 cm 25 cm Electronic Signature(s) Signed: 04/07/2022 5:12:10 PM By: [name] RN Entered By: [name] on 04/07/2022 10:14:59 -------------------------------------------------------------------------------- Multi Wound Chart Details Patient Name: Date of Service: [name], [name] [place] E. 04/07/2022 9:30 A M Medical Record Number: [id number] Patient Account Number: [id number] Date of Birth/Sex: Treating RN: 09-06-1975 (46 y.o. M) Primary Care Letisha Yera: Dorinda Hill Other Clinician: Referring Oleta Gunnoe: Treating Chasidy Janak/Extender: Jana Hakim in Treatment: 90 Vital Signs Height(in): 69 Pulse(bpm): 87 Weight(lbs): 280 Blood Pressure(mmHg): 157/83 Body Mass Index(BMI): 41.3 Temperature(F): 98.6 Respiratory Rate(breaths/min): 16 Photos: [N/A:N/A] Left Calcaneus N/A N/A Wound  Location: Pressure Injury N/A N/A Wounding Event: Pressure Ulcer N/A N/A Primary Etiology: Asthma, Angina, Hypertension N/A N/A Comorbid History: 10/07/2019 N/A N/A Date Acquired: 26 N/A N/A Weeks of Treatment: Open N/A N/A Wound Status: No N/A N/A Wound Recurrence: 0.6x0.5x0.2 N/A N/A Measurements L x W x D (cm) 0.236 N/A N/A A (cm) : rea 0.047 N/A N/A Volume (cm) : 99.10% N/A N/A % Reduction in A rea: 99.80% N/A N/A % Reduction in Volume: Category/Stage III N/A N/A Classification: Medium N/A N/A Exudate A mount: Serosanguineous N/A N/A  Exudate Type: red, brown N/A N/A Exudate Color: Thickened N/A N/A Wound Margin: Medium (34-66%) N/A N/A Granulation A mount: Red N/A N/A Granulation Quality: Medium (34-66%) N/A N/A Necrotic A mount: Fat Layer (Subcutaneous Tissue): Yes N/A N/A Exposed Structures: Fascia: No Tendon: No Muscle: No Joint: No Bone: No Small (1-33%) N/A N/A Epithelialization: Debridement - Selective/Open Wound N/A N/A Debridement: Pre-procedure Verification/Time Out 10:25 N/A N/A Taken: Lidocaine 4% Topical Solution N/A N/A Pain Control: Slough N/A N/A Tissue Debrided: Non-Viable Tissue N/A N/A Level: 0.3 N/A N/A Debridement A (sq cm): rea Curette N/A N/A Instrument: Minimum N/A N/A Bleeding: Pressure N/A N/A Hemostasis A chieved: 0 N/A N/A Procedural Pain: 0 N/A N/A Post Procedural Pain: Procedure was tolerated well N/A N/A Debridement Treatment Response: 0.6x0.5x0.2 N/A N/A Post Debridement Measurements L x W x D (cm) 0.047 N/A N/A Post Debridement Volume: (cm) Category/Stage III N/A N/A Post Debridement Stage: Debridement N/A N/A Procedures Performed: Treatment Notes Electronic Signature(s) Signed: 04/07/2022 10:27:48 AM By: Fredirick Maudlin MD FACS Entered By: Fredirick Maudlin on 04/07/2022 10:27:47 -------------------------------------------------------------------------------- Multi-Disciplinary Care Plan  Details Patient Name: Date of Service: [name], [name] [place] E. 04/07/2022 9:30 A M Medical Record Number: [id number] Patient Account Number: [id number] Date of Birth/Sex: Treating RN: [birth year] (46 y.o. [name] Primary Care Bree Heinzelman: [name] Other Clinician: Referring Uziel Covault: Treating [place] Lebeau/Extender: [name] in Treatment: 47 Multidisciplinary Care Plan reviewed with physician Active Inactive Wound/Skin Impairment Nursing Diagnoses: Impaired tissue integrity Knowledge deficit related [name] ulceration/compromised skin integrity Goals: Patient/caregiver will verbalize understanding of skin care regimen Date Initiated: 07/15/2020 Target Resolution Date: 07/07/2022 Goal Status: Active Ulcer/skin breakdown will have a volume reduction of 30% by week 4 Date Initiated: 07/15/2020 Date Inactivated: 08/20/2020 Target Resolution Date: 09/03/2020 Goal Status: Unmet Unmet Reason: no major changes. Ulcer/skin breakdown will heal within 14 weeks Date Initiated: 12/04/2020 Date Inactivated: 12/10/2020 Target Resolution Date: 12/10/2020 Unmet Reason: wounds still open at 14 Goal Status: Unmet weeks and today 21 weeks. Interventions: Assess patient/caregiver ability [name] obtain necessary supplies Assess patient/caregiver ability [name] perform ulcer/skin care regimen upon admission and as needed Assess ulceration(s) every visit Provide education on ulcer and skin care Treatment Activities: Skin care regimen initiated : 07/15/2020 Topical wound management initiated : 07/15/2020 Notes: Electronic Signature(s) Signed: 04/07/2022 5:12:10 PM By: [name] Entered By: [name] on 04/07/2022 11:06:37 -------------------------------------------------------------------------------- Pain Assessment Details Patient Name: Date of Service: [name], [place]. 04/07/2022 9:30 A M Medical Record Number: [id number] Patient Account Number: [id number] Date of  Birth/Sex: Treating RN: 03-Nov-1975 (46 y.o. [name] Primary Care Araminta Zorn: [name] Other Clinician: Referring Tienna Bienkowski: Treating Jedadiah Abdallah/Extender: [name] in Treatment: 35 Active Problems Location of Pain Severity and Description of Pain Patient Has Paino No Site Locations Pain Management and Medication Current Pain Management: Electronic Signature(s) Signed: 04/07/2022 5:12:10 PM By: [name] Entered By: [name] on 04/07/2022 10:14:36 -------------------------------------------------------------------------------- Patient/Caregiver Education Details Patient Name: Date of Service: [name] 8/31/2023andnbsp9:30 A M Medical Record Number: [id number] Patient Account Number: [id number] Date of Birth/Gender: Treating RN: 1976/02/05 (46 y.o. [name] Primary Care Physician: [name] Other Clinician: Referring Physician: Treating Physician/Extender: [name] in Treatment: 67 Education Assessment Education Provided [name]: Patient Education Topics Provided Wound/Skin Impairment: Methods: Explain/Verbal Responses: Return demonstration correctly Electronic Signature(s) Signed: 04/07/2022 5:12:10 PM By: [name] Entered By: [name] on 04/07/2022 11:06:52 -------------------------------------------------------------------------------- Wound Assessment Details Patient Name: Date of  Service: [name], [name] [name] 04/07/2022 9:30 A M Medical Record Number: [id number] Patient Account Number: [id number] Date of Birth/Sex: Treating RN: 03-05-1976 (46 y.o. [name] Primary Care Mayank Teuscher: Dorinda Hill Other Clinician: Referring Rowan Blaker: Treating Amayia Ciano/Extender: Jana Hakim in Treatment: 90 Wound Status Wound Number: 2 Primary Etiology: Pressure Ulcer Wound Location: Left Calcaneus Wound Status: Open Wounding Event: Pressure  Injury Comorbid History: Asthma, Angina, Hypertension Date Acquired: 10/07/2019 Weeks Of Treatment: 90 Clustered Wound: No Photos Wound Measurements Length: (cm) 0.6 Width: (cm) 0.5 Depth: (cm) 0.2 Area: (cm) 0.236 Volume: (cm) 0.047 % Reduction in Area: 99.1% % Reduction in Volume: 99.8% Epithelialization: Small (1-33%) Tunneling: No Undermining: No Wound Description Classification: Category/Stage III Wound Margin: Thickened Exudate Amount: Medium Exudate Type: Serosanguineous Exudate Color: red, brown Foul Odor After Cleansing: No Slough/Fibrino No Wound Bed Granulation Amount: Medium (34-66%) Exposed Structure Granulation Quality: Red Fascia Exposed: No Necrotic Amount: Medium (34-66%) Fat Layer (Subcutaneous Tissue) Exposed: Yes Necrotic Quality: Adherent Slough Tendon Exposed: No Muscle Exposed: No Joint Exposed: No Bone Exposed: No Treatment Notes Wound #2 (Calcaneus) Wound Laterality: Left Cleanser Normal Saline Discharge Instruction: Cleanse the wound with Normal Saline prior [name] applying a clean dressing using gauze sponges, not tissue or cotton balls. Wound Cleanser Discharge Instruction: Cleanse the wound with wound cleanser prior [name] applying a clean dressing using gauze sponges, not tissue or cotton balls. Peri-Wound Care Topical Primary Dressing Endoform 2x2 in Discharge Instruction: Moisten with saline Secondary Dressing Optifoam Non-Adhesive Dressing, 4x4 in Discharge Instruction: Apply over primary dressing as directed. Woven Gauze Sponge, Non-Sterile 4x4 in Discharge Instruction: Apply over primary dressing as directed. Secured With 86M Medipore H Soft Cloth Surgical T ape, 4 x 10 (in/yd) Discharge Instruction: Secure with tape as directed. Compression Wrap Compression Stockings Add-Ons Electronic Signature(s) Signed: 04/07/2022 5:12:10 PM By: [name] RN Entered By: [name] on 04/07/2022  10:19:58 -------------------------------------------------------------------------------- Vitals Details Patient Name: Date of Service: [name], [name] [place] E. 04/07/2022 9:30 A M Medical Record Number: [id number] Patient Account Number: [id number] Date of Birth/Sex: Treating RN: 1976-04-01 (46 y.o. [name] Primary Care Kerrigan Gombos: Dorinda Hill Other Clinician: Referring Marlyn Tondreau: Treating Akeyla Molden/Extender: Jana Hakim in Treatment: 90 Vital Signs Time Taken: 09:55 Temperature (F): 98.6 Height (in): 69 Pulse (bpm): 87 Weight (lbs): 280 Respiratory Rate (breaths/min): 16 Body Mass Index (BMI): 41.3 Blood Pressure (mmHg): 157/83 Reference Range: 80 - 120 mg / dl Electronic Signature(s) Signed: 04/07/2022 5:12:10 PM By: [name] RN Entered By: [name] on 04/07/2022 10:14:12

## 2022-04-12 ENCOUNTER — Other Ambulatory Visit: Payer: Self-pay | Admitting: Behavioral Health

## 2022-04-13 ENCOUNTER — Other Ambulatory Visit: Payer: Self-pay | Admitting: Behavioral Health

## 2022-04-13 DIAGNOSIS — F4321 Adjustment disorder with depressed mood: Secondary | ICD-10-CM

## 2022-04-13 DIAGNOSIS — F39 Unspecified mood [affective] disorder: Secondary | ICD-10-CM

## 2022-04-13 DIAGNOSIS — F411 Generalized anxiety disorder: Secondary | ICD-10-CM

## 2022-04-13 DIAGNOSIS — F331 Major depressive disorder, recurrent, moderate: Secondary | ICD-10-CM

## 2022-04-15 ENCOUNTER — Encounter (HOSPITAL_BASED_OUTPATIENT_CLINIC_OR_DEPARTMENT_OTHER): Payer: PPO | Attending: General Surgery | Admitting: General Surgery

## 2022-04-15 DIAGNOSIS — L97422 Non-pressure chronic ulcer of left heel and midfoot with fat layer exposed: Secondary | ICD-10-CM | POA: Insufficient documentation

## 2022-04-15 DIAGNOSIS — L84 Corns and callosities: Secondary | ICD-10-CM | POA: Insufficient documentation

## 2022-04-15 DIAGNOSIS — L89623 Pressure ulcer of left heel, stage 3: Secondary | ICD-10-CM | POA: Diagnosis not present

## 2022-04-15 NOTE — Progress Notes (Signed)
JASIYAH, PAULDING ([id number]) Visit Report for 04/15/2022 Arrival Information Details Patient Name: Date of Service: [name], TO [name] 04/15/2022 9:15 A M Medical Record Number: [id number] Patient Account Number: [id number] Date of Birth/Sex: Treating RN: July 10, 1976 (46 y.o. [name] Primary Care Carmell Elgin: Dorinda Hill Other Clinician: Referring Anaston Koehn: Treating Rozalyn Osland/Extender: Jana Hakim in Treatment: 84 Visit Information History Since Last Visit Added or deleted any medications: No Patient Arrived: Wheel Chair Any new allergies or adverse reactions: No Arrival Time: 09:35 Had a fall or experienced change in No Accompanied By: spouse activities of daily living that may affect Transfer Assistance: None risk of falls: Patient Identification Verified: Yes Signs or symptoms of abuse/neglect since last visito No Patient Requires Transmission-Based Precautions: No Hospitalized since last visit: No Patient Has Alerts: No Implantable device outside of the clinic excluding No cellular tissue based products placed in the center since last visit: Has Dressing in Place as Prescribed: Yes Pain Present Now: No Electronic Signature(s) Signed: 04/15/2022 5:34:33 PM By: [name] RN Entered By: [name] on 04/15/2022 10:02:15 -------------------------------------------------------------------------------- Encounter Discharge Information Details Patient Name: Date of Service: [name], [name] 04/15/2022 9:15 A M Medical Record Number: [id number] Patient Account Number: [id number] Date of Birth/Sex: Treating RN: [birth year] (46 y.o. [name] Primary Care Madigan Rosensteel: Dorinda Hill Other Clinician: Referring Jahlen Bollman: Treating Faith Patricelli/Extender: Jana Hakim in Treatment: 63 Encounter Discharge Information Items Post Procedure Vitals Discharge Condition: Stable Temperature (F): 98.1 Ambulatory Status: Wheelchair Pulse  (bpm): 74 Discharge Destination: Home Respiratory Rate (breaths/min): 18 Transportation: Private Auto Blood Pressure (mmHg): 161/97 Accompanied By: spouse Schedule Follow-up Appointment: Yes Clinical Summary of Care: Patient Declined Electronic Signature(s) Signed: 04/15/2022 5:34:33 PM By: [name] RN Entered By: [name] on 04/15/2022 17:31:03 -------------------------------------------------------------------------------- Lower Extremity Assessment Details Patient Name: Date of Service: [name], TO [name] 04/15/2022 9:15 A M Medical Record Number: [id number] Patient Account Number: [id number] Date of Birth/Sex: Treating RN: Dec 06, 1975 (46 y.o. [name] Primary Care Arlan Birks: Dorinda Hill Other Clinician: Referring Shenequa Howse: Treating Yosselin Zoeller/Extender: Jana Hakim in Treatment: 17 Edema Assessment Assessed: [name]: No] [Right: No] Edema: [Left: N] [Right: o] Calf Left: Right: Point of Measurement: 29 cm From Medial Instep 44 cm 39 cm Ankle Left: Right: Point of Measurement: 9 cm From Medial Instep 25.5 cm 25 cm Electronic Signature(s) Signed: 04/15/2022 5:34:33 PM By: [name] RN Entered By: [name] on 04/15/2022 10:02:53 -------------------------------------------------------------------------------- Multi Wound Chart Details Patient Name: Date of Service: [name], [name] 04/15/2022 9:15 A M Medical Record Number: [id number] Patient Account Number: [id number] Date of Birth/Sex: Treating RN: [birth year] (46 y.o. [name] Primary Care Emily Forse: Dorinda Hill Other Clinician: Referring Blake Vetrano: Treating [name]/Extender: Jana Hakim in Treatment: 60 Vital Signs Height(in): 69 Pulse(bpm): 74 Weight(lbs): 280 Blood Pressure(mmHg): 161/97 Body Mass Index(BMI): 41.3 Temperature(F): 98.1 Respiratory Rate(breaths/min): 18 Photos: [N/A:N/A] Left Calcaneus N/A N/A Wound  Location: Pressure Injury N/A N/A Wounding Event: Pressure Ulcer N/A N/A Primary Etiology: Asthma, Angina, Hypertension N/A N/A Comorbid History: 10/07/2019 N/A N/A Date Acquired: 1 N/A N/A Weeks of Treatment: Open N/A N/A Wound Status: No N/A N/A Wound Recurrence: 0.3x0.2x0.2 N/A N/A Measurements L x W x D (cm) 0.047 N/A N/A A (cm) : rea 0.009 N/A N/A Volume (cm) : 99.80% N/A N/A % Reduction in A rea: 100.00% N/A N/A % Reduction in Volume: Category/Stage III N/A N/A Classification: Medium N/A N/A Exudate A mount: Serosanguineous  N/A N/A Exudate Type: red, brown N/A N/A Exudate Color: Thickened N/A N/A Wound Margin: Medium (34-66%) N/A N/A Granulation A mount: Red N/A N/A Granulation Quality: Medium (34-66%) N/A N/A Necrotic A mount: Fat Layer (Subcutaneous Tissue): Yes N/A N/A Exposed Structures: Fascia: No Tendon: No Muscle: No Joint: No Bone: No Small (1-33%) N/A N/A Epithelialization: Debridement - Selective/Open Wound N/A N/A Debridement: Pre-procedure Verification/Time Out 10:01 N/A N/A Taken: Lidocaine 4% T opical Solution N/A N/A Pain Control: Callus, Slough N/A N/A Tissue Debrided: Non-Viable Tissue N/A N/A Level: 0.06 N/A N/A Debridement A (sq cm): rea Curette N/A N/A Instrument: Minimum N/A N/A Bleeding: Pressure N/A N/A Hemostasis A chieved: 0 N/A N/A Procedural Pain: 0 N/A N/A Post Procedural Pain: Procedure was tolerated well N/A N/A Debridement Treatment Response: 0.3x0.2x0.1 N/A N/A Post Debridement Measurements L x W x D (cm) 0.005 N/A N/A Post Debridement Volume: (cm) Category/Stage III N/A N/A Post Debridement Stage: Debridement N/A N/A Procedures Performed: T Contact Cast otal Treatment Notes Electronic Signature(s) Signed: 04/15/2022 10:36:46 AM By: [name] MD FACS Signed: 04/15/2022 5:34:33 PM By: [name] RN Entered By: [name] on 04/15/2022  10:36:46 -------------------------------------------------------------------------------- Multi-Disciplinary Care Plan Details Patient Name: Date of Service: [name], TO [name] 04/15/2022 9:15 A M Medical Record Number: [id number] Patient Account Number: [id number] Date of Birth/Sex: Treating RN: 04/11/1976 (46 y.o. [name] Primary Care Finnian Husted: Dorinda Hill Other Clinician: Referring Brandilynn Taormina: Treating Miachel Nardelli/Extender: Jana Hakim in Treatment: 94 Multidisciplinary Care Plan reviewed with physician Active Inactive Wound/Skin Impairment Nursing Diagnoses: Impaired tissue integrity Knowledge deficit related to ulceration/compromised skin integrity Goals: Patient/caregiver will verbalize understanding of skin care regimen Date Initiated: 07/15/2020 Target Resolution Date: 07/07/2022 Goal Status: Active Ulcer/skin breakdown will have a volume reduction of 30% by week 4 Date Initiated: 07/15/2020 Date Inactivated: 08/20/2020 Target Resolution Date: 09/03/2020 Goal Status: Unmet Unmet Reason: no major changes. Ulcer/skin breakdown will heal within 14 weeks Date Initiated: 12/04/2020 Date Inactivated: 12/10/2020 Target Resolution Date: 12/10/2020 Unmet Reason: wounds still open at 14 Goal Status: Unmet weeks and today 21 weeks. Interventions: Assess patient/caregiver ability to obtain necessary supplies Assess patient/caregiver ability to perform ulcer/skin care regimen upon admission and as needed Assess ulceration(s) every visit Provide education on ulcer and skin care Treatment Activities: Skin care regimen initiated : 07/15/2020 Topical wound management initiated : 07/15/2020 Notes: Electronic Signature(s) Signed: 04/15/2022 5:34:33 PM By: [name] RN Entered By: [name] on 04/15/2022 17:29:57 -------------------------------------------------------------------------------- Pain Assessment Details Patient Name: Date of Service: [name], TO [name] 04/15/2022 9:15 A M Medical Record Number: [id number] Patient Account Number: [id number] Date of Birth/Sex: Treating RN: May 27, 1976 (46 y.o. [name] Primary Care Zemira Zehring: Dorinda Hill Other Clinician: Referring Laycee Fitzsimmons: Treating Shaqueena Mauceri/Extender: Jana Hakim in Treatment: 30 Active Problems Location of Pain Severity and Description of Pain Patient Has Paino No Site Locations Pain Management and Medication Current Pain Management: Electronic Signature(s) Signed: 04/15/2022 5:34:33 PM By: [name] RN Entered By: [name] on 04/15/2022 10:02:48 -------------------------------------------------------------------------------- Patient/Caregiver Education Details Patient Name: Date of Service: Mart Piggs 9/8/2023andnbsp9:15 A M Medical Record Number: [id number] Patient Account Number: [id number] Date of Birth/Gender: Treating RN: [birth year] (46 y.o. [name] Primary Care Physician: Dorinda Hill Other Clinician: Referring Physician: Treating Physician/Extender: Jana Hakim in Treatment: 46 Education Assessment Education Provided To: Patient Education Topics Provided Wound/Skin Impairment: Methods: Explain/Verbal Responses: Return demonstration correctly Electronic Signature(s) Signed: 04/15/2022 5:34:33 PM By: [name]  RN Entered By: [name] on 04/15/2022 17:30:10 -------------------------------------------------------------------------------- Wound Assessment Details Patient Name: Date of Service: [name] [place] 04/15/2022 9:15 A M Medical Record Number: [id number] Patient Account Number: [id number] Date of Birth/Sex: Treating RN: [birth year] (46 y.o. [name] Primary Care Bennie Chirico: [name] Other Clinician: Referring Milany Geck: Treating Grason Brailsford/Extender: [name] in Treatment: 53 Wound Status Wound Number:  2 Primary Etiology: Pressure Ulcer Wound Location: Left Calcaneus Wound Status: Open Wounding Event: Pressure Injury Comorbid History: Asthma, Angina, Hypertension Date Acquired: 10/07/2019 Weeks Of Treatment: 91 Clustered Wound: No Photos Wound Measurements Length: (cm) 0.3 Width: (cm) 0.2 Depth: (cm) 0.2 Area: (cm) 0.047 Volume: (cm) 0.009 % Reduction in Area: 99.8% % Reduction in Volume: 100% Epithelialization: Small (1-33%) Tunneling: No Undermining: No Wound Description Classification: Category/Stage III Wound Margin: Thickened Exudate Amount: Medium Exudate Type: Serosanguineous Exudate Color: red, brown Foul Odor After Cleansing: No Slough/Fibrino No Wound Bed Granulation Amount: Medium (34-66%) Exposed Structure Granulation Quality: Red Fascia Exposed: No Necrotic Amount: Medium (34-66%) Fat Layer (Subcutaneous Tissue) Exposed: Yes Necrotic Quality: Adherent Slough Tendon Exposed: No Muscle Exposed: No Joint Exposed: No Bone Exposed: No Treatment Notes Wound #2 (Calcaneus) Wound Laterality: Left Cleanser Normal Saline Discharge Instruction: Cleanse the wound with Normal Saline prior to applying a clean dressing using gauze sponges, not tissue or cotton balls. Wound Cleanser Discharge Instruction: Cleanse the wound with wound cleanser prior to applying a clean dressing using gauze sponges, not tissue or cotton balls. Peri-Wound Care Topical Primary Dressing Endoform 2x2 in Discharge Instruction: Moisten with saline Secondary Dressing Optifoam Non-Adhesive Dressing, 4x4 in Discharge Instruction: Apply over primary dressing as directed. Woven Gauze Sponge, Non-Sterile 4x4 in Discharge Instruction: Apply over primary dressing as directed. Secured With 4M Medipore H Soft Cloth Surgical T ape, 4 x 10 (in/yd) Discharge Instruction: Secure with tape as directed. Compression Wrap Compression Stockings Add-Ons Electronic Signature(s) Signed: 04/15/2022  5:34:33 PM By: [name] Entered By: [name] on 04/15/2022 09:54:21 -------------------------------------------------------------------------------- Vitals Details Patient Name: Date of Service: [name], [place] 04/15/2022 9:15 A M Medical Record Number: [id number] Patient Account Number: [id number] Date of Birth/Sex: Treating RN: 09-15-1975 (46 y.o. [name] Primary Care Marella Vanderpol: [name] Other Clinician: Referring Zurri Rudden: Treating Harkirat Orozco/Extender: [name] in Treatment: 20 Vital Signs Time Taken: 09:37 Temperature (F): 98.1 Height (in): 69 Pulse (bpm): 74 Weight (lbs): 280 Respiratory Rate (breaths/min): 18 Body Mass Index (BMI): 41.3 Blood Pressure (mmHg): 161/97 Reference Range: 80 - 120 mg / dl Electronic Signature(s) Signed: 04/15/2022 5:34:33 PM By: [name] Entered By: [name] on 04/15/2022 10:02:42

## 2022-04-15 NOTE — Progress Notes (Signed)
MARCH, WALDROP ([id number]) Visit Report for 04/15/2022 Chief Complaint Document Details Patient Name: Date of Service: [name] [place] 04/15/2022 9:15 A M Medical Record Number: [id number] Patient Account Number: [id number] Date of Birth/Sex: Treating RN: Nov 17, 1975 (46 y.o. [name] Primary Care Provider: [name] Other Clinician: Referring Provider: Treating Provider/Extender: [name] in Treatment: 73 Information Obtained from: Patient Chief Complaint Bilateral Plantar Foot Ulcers Electronic Signature(s) Signed: 04/15/2022 10:38:02 AM By: Fredirick Maudlin MD FACS Entered By: Fredirick Maudlin on 04/15/2022 10:38:02 -------------------------------------------------------------------------------- Debridement Details Patient Name: Date of Service: [name], [place] 04/15/2022 9:15 A M Medical Record Number: [id number] Patient Account Number: [id number] Date of Birth/Sex: Treating RN: [birth year] (46 y.o. [name] Primary Care Provider: [name] Other Clinician: Referring Provider: Treating Provider/Extender: [name] in Treatment: 91 Debridement Performed for Assessment: Wound #2 Left Calcaneus Performed By: Physician Fredirick Maudlin, MD Debridement Type: Debridement Level of Consciousness (Pre-procedure): Awake and Alert Pre-procedure Verification/Time Out Yes - 10:01 Taken: Start Time: 10:01 Pain Control: Lidocaine 4% T opical Solution T Area Debrided (L x W): otal 0.3 (cm) x 0.2 (cm) = 0.06 (cm) Tissue and other material debrided: Non-Viable, Callus, Slough, Slough Level: Non-Viable Tissue Debridement Description: Selective/Open Wound Instrument: Curette Bleeding: Minimum Hemostasis Achieved: Pressure End Time: 10:02 Procedural Pain: 0 Post Procedural Pain: 0 Response to Treatment: Procedure was tolerated well Level of Consciousness (Post- Awake and Alert procedure): Post Debridement  Measurements of Total Wound Length: (cm) 0.3 Stage: Category/Stage III Width: (cm) 0.2 Depth: (cm) 0.1 Volume: (cm) 0.005 Character of Wound/Ulcer Post Debridement: Improved Post Procedure Diagnosis Same as Pre-procedure Electronic Signature(s) Signed: 04/15/2022 12:27:52 PM By: Fredirick Maudlin MD FACS Signed: 04/15/2022 5:34:33 PM By: [name] Entered By: [name] on 04/15/2022 10:05:09 -------------------------------------------------------------------------------- HPI Details Patient Name: Date of Service: [name], [name] 04/15/2022 9:15 A M Medical Record Number: [id number] Patient Account Number: [id number] Date of Birth/Sex: Treating RN: April 28, 1976 (46 y.o. [name] Primary Care Provider: [name] Other Clinician: Referring Provider: Treating Provider/Extender: [name] in Treatment: 31 History of Present Illness HPI Description: Wounds are12/03/2020 upon evaluation today patient presents for initial inspection here in our clinic concerning issues he has been having with the bottoms of his feet bilaterally. He states these actually occurred as wounds when he was hospitalized for 5 months secondary to Covid. He was apparently with tilting bed where he was in an upright position quite frequently and apparently this occurred in some way shape or form during that time. Fortunately there is no sign of active infection at this time. No fevers, chills, nausea, vomiting, or diarrhea. With that being said he still has substantial wounds on the plantar aspects of his feet Theragen require quite a bit of work to get these to heal. He has been using Santyl currently though that is been problematic both in receiving the medication as well as actually paid for it as it is become quite expensive. Prior to the experience with Covid the patient really did not have any major medical problems other than hypertension he does have some mild  generalized weakness following the Covid experience. 07/22/2020 on evaluation today patient appears to be doing okay in regard to his foot ulcers I feel like the wound beds are showing signs of better improvement that I do believe the Iodoflex is helping in this regard. With that being said he does have a lot of drainage  currently and this is somewhat blue/green in nature which is consistent with Pseudomonas. I do think a culture today would be appropriate for [place] to evaluate and see if that is indeed the case I would likely start him on antibiotic orally as well he is not allergic to Cipro knows of no issues he has had in the past 12/21; patient was admitted to the clinic earlier this month with bilateral presumed pressure ulcers on the bottom of his feet apparently related to excessive pressure from a tilt table arrangement in the intensive care unit. Patient relates this to being on ECMO but I am not really sure that is exactly related to that. I must say I have never seen anything like this. He has fairly extensive full-thickness wounds extending from his heel towards his midfoot mostly centered laterally. There is already been some healing distally. He does not appear to have an arterial issue. He has been using gentamicin to the wound surfaces with Iodoflex to help with ongoing debridement 1/6; this is a patient with pressure ulcers on the bottom of his feet related to excessive pressure from a standing position in the intensive care unit. He is complaining of a lot of pain in the right heel. He is not a diabetic. 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 We have been using Iodoflex to help prepare the surfaces of both wounds for an advanced treatment product. He is nonambulatory spending most of his time in a wheelchair I have asked him not to propel the wheelchair with his heels 1/13; in general his wounds look better not much surface area change we have been using  Iodoflex as of last week. I did an x-ray of the right heel as the patient was complaining of pain. I had some thoughts about a stress fracture perhaps Achilles tendon problems however what it showed was erosive changes along the inferior aspect of the calcaneus he now has a MRI booked for 1/20. 1/20; in general his wounds continue to be better. Some improvement in the large narrow areas proximally in his foot. He is still complaining of pain in the right heel and tenderness in certain areas of this wound. His MRI is tonight. I am not just looking for osteomyelitis that was brought up on the x-ray I am wondering about stress fractures, tendon ruptures etc. He has no such findings on the left. Also noteworthy is that the patient had critical illness neuropathy and some of the discomfort may be actual improvement in nerve function I am just not sure. These wounds were initially in the setting of severe critical illness related to COVID-19. He was put in a standing position. He may have also been on pressors at the point contributing to tissue ischemia. By his description at some point these wounds were grossly necrotic extending proximally up into the Achilles part of his heel. I do not know that I have ever really seen pictures of them like this although they may exist in epic We have ordered Tri layer Oasis. I am trying to stimulate some granulation in these areas. This is of course assuming the MRI is negative for infection 1/27; since the patient was last here he saw Dr. Juleen China of infectious disease. He is planned for vancomycin and ceftriaxone. Prior operative culture grew MSSA. Also ordered baseline lab work. He also ordered arterial studies although the ABIs in our clinic were normal as well as his clinical exam these were normal I do not think  he needs to see vascular surgery. His ABIs at the [facility] were 1.22 in the right triphasic waveforms with a normal TBI of 1.15 on the left ABI of 1.22 with  triphasic waveforms and a normal TBI of 1.08. Finally he saw Dr. Logan Bores who will follow him in for 2 months. At this point I do not think he felt that he needed a procedure on the right calcaneal bone. Dr. Earlene Plater is elected for broad-spectrum antibiotic The patient is still having pain in the right heel. He walks with a walker 2/3; wounds are generally smaller. He is tolerating his IV antibiotics. I believe this is vancomycin and ceftriaxone. We are still waiting for Oasis burn in terms of his out-of-pocket max which he should be meeting soon given the IV antibiotics, MRIs etc. I have asked him to check in on this. We are using silver collagen in the meantime the wounds look better 2/10; tolerating IV vancomycin and Rocephin. We are waiting to apply for Oasis. Although I am not really sure where he is in his out-of-pocket max. 2/17 started the first application of Oasis trilayer. Still on antibiotics. The wounds have generally look better. The area on the left has a little more surface slough requiring debridement 2/24; second application of Oasis trilayer. The wound surface granulation is generally look better. The area on the left with undermining laterally I think is come in a bit. 10/08/2020 upon evaluation today patient is here today for [employer] application #3. Fortunately he seems to be doing extremely well with regard to this and we are seeing a lot of new epithelial growth which is great news. Fortunately there is no signs of active infection at this time. 10/16/2020 upon evaluation today patient appears to be doing well with regard to his foot ulcers. Do believe the Oasis has been of benefit for him. I do not see any signs of infection right now which is great news and I think that he has a lot of new epithelial growth which is great to see as well. The patient is very pleased to hear all of this. I do think we can proceed with the Oasis trilayer #4 today. 3/18; not as much improvement  in these areas on his heels that I was hoping. I did reapply trilateral Oasis today the tissue looks healthier but not as much fill in as I was hoping. 3/25; better looking today I think this is come in a bit the tissue looks healthier. Triple layer Oasis reapplied #6 4/1; somewhat better looking definitely better looking surface not as much change in surface area as I was hoping. He may be spending more time [place] on days then he needs to although he does have heel offloading boots. Triple layer Oasis reapplied #7 4/7; unfortunately apparently [facility] will not approve any further Oasis which is unfortunate since the patient did respond nicely both in terms of the condition of the wound bed as well as surface area. There is still some drainage coming from the wound but not a lot there does not appear to be any infection 4/15; we have been using Hydrofera Blue. He continues to have nice rims of epithelialization on the right greater than the left. The left the epithelialization is coming from the tip of his heel. There is moderate drainage. In this that concerns me about a total contact cast. There is no evidence of infection 4/29; patient has been using Hydrofera Blue with dressing changes. He has no  complaints or issues today. 5/5; using Hydrofera Blue. I actually think that he looks marginally better than the last time I saw this 3 weeks ago. There are rims of epithelialization on the left thumb coming from the medial side on the right. Using Hydrofera Blue 5/12; using Hydrofera Blue. These continue to make improvements in surface area. His drainage was not listed as severe I therefore went ahead and put a cast on the left foot. Right foot we will continue to dress his previous 5/16; back for first total contact cast change. He did not tolerate this particularly well cast injury on the anterior tibia among other issues. Difficulty sleeping. I talked him about this in some detail and  afterwards is elected to continue. I told him I would like to have a cast on for 3 weeks to see if this is going to help at all. I think he agreed 5/19; I think the wound is better. There is no tunneling towards his midfoot. The undermining medially also looks better. He has a rim of new skin distally. I think we are making progress here. The area on the left also continues to look somewhat better to me using Hydrofera Blue. He has a list of complaints about the cast but none of them seem serious 5/26; patient presents for 1 week follow-up. He has been using a total contact cast and tolerating this well. Hydrofera Blue is the main dressing used. He denies signs of infection. 6/2 Hydrofera Blue total contact cast on the left. These were large ulcers that formed in intensive care unit where the patient was recovering from [place]. May have had something to do with being ventilated in an upright positiono Pressors etc. We have been able to get the areas down considerably and a viable surface. There is some epithelialization in both sides. Note made of drainage 6/9; changed to [facility] last time because of drainage. He arrives with better looking surfaces and dimensions on the left than the right. Paradoxically the right actually probes more towards his midfoot the left is largely close down but both of these look improved. Using a total contact cast on the left 6/16; complex wounds on his bilateral plantar heels which were initially pressure injury from a stay in the ICU with COVID. We have been using silver alginate most recently. His dimensions of come in quite dramatically however not recently. We have been putting the left foot in a total contact cast 6/23; complex wounds on the bilateral plantar heels. I been putting the left in the cast paradoxically the area on the right is the one that is going towards closure at a faster rate. Quite a bit of drainage on the left. The patient went to see Dr.  Amalia Hailey who said he was going to standby for skin grafts. I had actually considered sending him for skin grafts however he would be mandatorily off his feet for a period of weeks to months. I am thinking that the area on the right is going to close on its own the area on the left has been more stubborn even though we have him in a total contact cast 6/30; took him out of a total contact cast last week is the right heel seem to be making better progress than the left where I was placing the cast. We are using silver alginate. Both wounds are smaller right greater than left 7/12; both wounds look as though they are making some progress. We are using silver alginate.  Heel offloading boots 7/26; very gradual progress especially on the right. Using silver alginate. He is wearing heel offloading boots 8/18; he continues to close these wounds down very gradually. Using silver alginate. The problem polymen being definitive about this is areas of what appears to be callus around the margins. This is not a 100% of the area but certainly sizable especially on the right 9/1; bilateral plantar feet wounds secondary to prolonged pressure while being ventilated for COVID-19 in an upright position. Essentially pressure ulcers on the bottom of his feet. He is made substantial progress using silver alginate. 9/14; bilateral plantar feet wounds secondary to prolonged pressure. Making progress using silver alginate. 9/29 bilateral plantar feet wounds secondary to prolonged pressure. I changed him to Iodoflex last week. MolecuLight showing reddened blush fluorescence 10/11; patient presents for follow-up. He has no issues or complaints today. He denies signs of infection. He continues to use Iodoflex and antibiotic ointment to the wound beds. 10/27; 2-week follow-up. No evidence of infection. He has callus and thick dry skin around the wound margins we have been using Iodoflex and Bactroban which was in response to a  moderate left MolecuLight reddish blush fluorescence. 11/10; 2-week follow-up. Wound margins again have thick callus however the measurements of the actual wound sites are a lot smaller. Everything looks reasonably healthy here. We have been using Iodoflex He was approved for prime matrix but I have elected to delay this given the improvement in the surface area. Hopefully I will not regret that decision as were getting close to the end of the year in terms of insurance payment 12/8; 2-week follow-up. Wounds are generally smaller in size. These were initially substantial wounds extending into the forefoot all the way into the heel on the bilateral plantar feet. They are now both located on the plantar heel distal aspect both of these have a lot of callus around the wounds I used a #5 curette to remove this on the right and the left also some subcutaneous debris to try and get the wound edges were using Iodoflex. He has heel offloading shoe 12/22; 2-week follow-up. Not really much improvement. He has thick callus around the outer edges of both wounds. I remove this there is some nonviable subcutaneous tissue as well. We have been using Iodoflex. Her intake nurse and myself spontaneously thought of a total contact cast I went back in May. At that time we really were not seeing much of an improvement with a cast although the wound was in a much different situation I would like to retry this in 2 weeks and I discussed this with the patient 08/12/2021; the patient has had some improvement with the Iodoflex. The the area on the left heel plantar more improved than the right. I had to put him in a total contact cast on the left although I decided to put that off for 2 weeks. I am going to change his primary dressing to silver collagen. I think in both areas he has had some improvement most of the healing seems to be more proximal in the heel. The wounds are in the mid aspect. A lot of thick callus on the right  heel however. 1/19; we are using silver collagen on both plantar heel areas. He has had some improvement today. The left did not require any debridement. He still had some eschar on the right that was debrided but both seem to have contracted. I did not put it total contact cast on him today  2/2 we have been using silver collagen. The area on the right plantar heel has areas that appear to be epithelialized interspersed with dry flaking callus and dry skin. I removed this. This really looks better than on the other side. On the left still a large area with raised edges and debris on the surface. 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 He has no issues or complaints today. 2/9; not much change in the left foot wound with 1 week of a cast we are using silver collagen. Silver collagen on the right side. The right side has been the better wound surface. We will reapply the total contact cast on the left 2/16; not much improvement on either side I been using silver collagen with a total contact cast on the left. I'm changing the Hydrofera Blue still with a total contact cast on the left 2/23; some improvement on both sides. Disappointing that he has thick callus around the area that we are putting in a total contact cast on the left. We've been using Hydrofera Blue on both wound areas. This is a man who at essentially pressure ulcers in addition to ischemia caused by medications to support his blood pressure (pressors) in the ICU. He was being ventilated in the standing position for severe Covid. A Shiley the wounds extended across his entire foot but are now localized to his plantar heels bilaterally. We have made progress however neither areas healed. I continue to think the total contact cast is helped albeit painstakingly slowly. He has never wanted a plastic surgery  consult although I don't know that they would be interested in grafting in area in this location. 10/07/2021: Continued improvement bilaterally. There is still some callus around the left wound, despite the total contact cast. He has some increased pain in his right midfoot around 1 particular area. This has been painful in the past but seems to be a little bit worse. When his cast was removed today, there was an area on the heel of the left foot that looks a bit macerated. He is also complaining of pain in his left thigh and hip which he thinks is secondary to the limb length discrepancy caused by the cast. 10/14/2021: He continues to improve. A little bit less callus around the left wound. He continues to endorse pain in his right midfoot, but this is not as significant as it was last week. The maceration on his left heel is improved. 10/21/2021: Continued improvement to both wounds. The maceration on his left heel is no longer evident. Less callus bilaterally. Epithelialization progressing. 10/28/2021: Significant improvement this week. The right sided wound is nearly closed with just a small open area at the middle. No maceration seen on the left heel. Continued epithelialization on both sides. No concern for infection. 11/04/2021: T oday, the wounds were measured a little bit differently and come out as larger, but I actually think they are about the same to potentially even smaller, particularly on the left. He continues to accumulate some callus on the right. 11/11/2021: T oday, the patient is expressing some concern that the left wound, despite being in the total contact cast, is not progressing at the same rate as the 1 on the right. He is interested in trying a week without the cast to see how the wound does. The wounds are roughly the same  size as last week, with the right perhaps being a little bit smaller. He continues to build up callus on both sites. 11/18/2021: Last week, I permitted the patient  to go without his total contact cast, just to see if the cast was really making any difference. Today, both wounds have deteriorated to some extent, suggesting that the cast is providing benefit, at least on the left. Both are larger and have accumulated callus, slough, and other debris. 11/26/2021: I debrided both wounds quite aggressively last week in an effort to stimulate the healing cascade. This appears to have been effective as the left sided wound is a full centimeter shorter in length. Although the right was measured slightly larger, on inspection, it looks as though an area of epithelialized tissue was included in the measurements. We have been using PolyMem Ag on the wound surfaces with a total contact cast to the left. 12/02/2021: It appears that the intake personnel are including epithelialized tissue in his wound measurements; the right wound is almost completely epithelialized; there is just a crater at the proximal midfoot with some open areas. On the left, he has built up some callus, but the overall wound surface looks good. There is some senescent skin around the wound margin. He has been in PolyMem Ag bilaterally with a total contact cast on the left. 12/09/2021: The right wound is nearly closed; there is just a small open area at the mid calcaneus. On the left, the wound is smaller with minimal callus buildup. No significant drainage. 12/16/2021: The right calcaneal wound remains minimally open at the mid calcaneus; the rest has epithelialized. On the left, the wound is also a little bit smaller. There is some senescent tissue on the periphery. He is getting his first application of a trial skin substitute called Vendaje today. 12/23/2021: The wound on his right calcaneus is nearly closed; there is just a small area at the most distal aspect of the calcaneus that is open. On the left, the area where we applied to the skin substitute has a healthier-looking bed of granulation tissue. The  wound dimensions are not significantly different on this side but the wound surface is improved. 12/30/2021: The wound on the right calcaneus has not changed significantly aside from some accumulation of callus. On the left, the open area is smaller and continues to have an improved surface. He continues to accumulate callus around the wound. He is here for his third application of Vendaje. 01/06/2022: The right calcaneal wound is down to just a couple of millimeters. He continues to accumulate periwound callus. He unfortunately got his cast wet earlier in the week and his left foot is macerated, resulting in some superficial skin loss just distal to the open wound. The open wound itself, however, is much smaller and has a healthier appearing surface. He is here for his fourth application of Vendaje. 01/13/2022: The right calcaneal wound is about the same. Unfortunately, once again, his cast got wet and his foot is again macerated. This is caused the left calcaneal wound to enlarge. He is here for his fifth application of Vendaje. 01/20/2022: The right calcaneal wound is very small. There is some periwound callus accumulation. He purchased a new cast protector last week and this has been effective in avoiding the maceration that has been occurring on the left. The left calcaneal wound is narrower and has a healthy and viable-appearing surface. He is here for his 6 application of Vendaje. 01/27/2022: The right calcaneal wound is down  to just a pinhole. There is some periwound slough and callus. On the left, the wound is narrower and shorter by about a centimeter. The surface is robust and viable-appearing. Unfortunately, the rep for the trial skin substitute product did not provide any for [place] to use today. 02/04/2022: The right calcaneal wound remains unchanged. There is more accumulated callus. On the left, although the intake nurse measured it a little bit longer, it looks about the same to me. The surface  has a layer of slough, but underneath this, there is good granulation tissue. 02/10/2022: The right calcaneus wound is nearly closed. There is still some callus that builds up around the site. The left side looks about the same in terms of dimensions, but the surface is more robust and vital-appearing. 02/16/2022: The area of the right calcaneus that was nearly closed last week has closed, but there is a small opening at the mid foot where it looks like some moisture got retained and caused some reopening. The left foot wound is narrower and shallower. Both sites have a fair amount of periwound callus and eschar. 02/24/2022: The small midfoot opening on the right calcaneus is a little bit smaller today. The left foot wound is narrower and shallower. He continues to accumulate periwound callus. No concern for infection. 03/01/2022: The patient came to clinic early because he showered and got his cast wet. Fortunately, there is no significant maceration to his foot but the callus softened and it looks like the wound on his left calcaneus may be a little bit wider. The wound on his right calcaneus is just a narrow slit. Continued accumulation of periwound callus bilaterally. 03/08/2022: The wound on his right calcaneus is very nearly closed, just a small pinpoint opening under a bit of eschar; the left wound has come in quite a bit, as well. It is narrower and shorter than at our last visit. Still with accumulated callus and eschar bilaterally. 03/17/2022: The right calcaneal wound is healed. The left wound is smaller and the surface itself is very clean, but there is some blue-green staining on the periwound callus, concerning for Pseudomonas aeruginosa. 03/23/2022: The right calcaneal wound remains closed. The left wound continues to contract. No further blue-green staining. Small amount of callus and slough accumulation. 03/28/2022: He came in early today because he had gotten his cast wet. On inspection, the  wound itself did not get wet or macerated, just a little bit of the forefoot. The wound itself is basically unchanged. 04/07/2022: The right foot wound remains closed. The left wound is the smallest that I have seen it to date. It is narrower and shorter. It still continues to accumulate slough on the surface. 04/15/2022: There is a band of epithelium now dividing the small left plantar foot wound in 2. There is still some slough on the surface. Electronic Signature(s) Signed: 04/15/2022 10:38:32 AM By: [name] MD FACS Entered By: [name] on 04/15/2022 10:38:32 -------------------------------------------------------------------------------- Physical Exam Details Patient Name: Date of Service: [name], [name] 04/15/2022 9:15 A M Medical Record Number: [id number] Patient Account Number: [id number] Date of Birth/Sex: Treating RN: 1976/06/06 (46 y.o. [name] Primary Care Provider: Dorinda Hill Other Clinician: Referring Provider: Treating Provider/Extender: Jana Hakim in Treatment: 59 Constitutional Hypertensive, asymptomatic. . . . No acute distress.Marland Kitchen Respiratory Normal work of breathing on room air.. Notes 04/15/2022: There is a band of epithelium now dividing the small left plantar foot wound in 2. There is still some  slough on the surface. Electronic Signature(s) Signed: 04/15/2022 10:39:03 AM By: Fredirick Maudlin MD FACS Entered By: Fredirick Maudlin on 04/15/2022 10:39:03 -------------------------------------------------------------------------------- Physician Orders Details Patient Name: Date of Service: [name], Joseph 04/15/2022 9:15 A M Medical Record Number: [id number] Patient Account Number: [id number] Date of Birth/Sex: Treating RN: 06/08/1976 (46 y.o. [name] Primary Care Provider: [name] Other Clinician: Referring Provider: Treating Provider/Extender: [name] in Treatment: 33 Verbal  / Phone Orders: No Diagnosis Coding ICD-10 Coding Code Description [phone] Non-pressure chronic ulcer of other part of left foot with fat layer exposed Follow-up Appointments ppointment in 1 week. - Dr [name] Room 3 Thursday 9/14 at 9:30am Thursday 04/28/22 at 9:30am Return A Anesthetic Wound #2 Left Calcaneus (In clinic) Topical Lidocaine 4% applied to wound bed - In clinic Bathing/ Shower/ Hygiene May shower with protection but do not get wound dressing(s) wet. - Cover with cast protector (can purchase cast protector at [facility] or [facility] ) Edema Control - Lymphedema / SCD / Other Bilateral Lower Extremities Elevate legs to the level of the heart or above for 30 minutes daily and/or when sitting, a frequency of: - throughout the day Avoid standing for long periods of time. Moisturize legs daily. - right leg and foot every night. Off-Loading Total Contact Cast to Left Lower Extremity Other: - keep pressure off of the bottom of your feet Additional Orders / Instructions Follow Nutritious Diet Wound Treatment Wound #2 - Calcaneus Wound Laterality: Left Cleanser: Normal Saline (Generic) 1 x Per Week/30 Days Discharge Instructions: Cleanse the wound with Normal Saline prior to applying a clean dressing using gauze sponges, not tissue or cotton balls. Cleanser: Wound Cleanser 1 x Per Week/30 Days Discharge Instructions: Cleanse the wound with wound cleanser prior to applying a clean dressing using gauze sponges, not tissue or cotton balls. Prim Dressing: Endoform 2x2 in 1 x Per Week/30 Days ary Discharge Instructions: Moisten with saline Secondary Dressing: Optifoam Non-Adhesive Dressing, 4x4 in (Generic) 1 x Per Week/30 Days Discharge Instructions: Apply over primary dressing as directed. Secondary Dressing: Woven Gauze Sponge, Non-Sterile 4x4 in 1 x Per Week/30 Days Discharge Instructions: Apply over primary dressing as directed. Secured With: 60M Medipore H Soft Cloth Surgical T ape,  4 x 10 (in/yd) (Generic) 1 x Per Week/30 Days Discharge Instructions: Secure with tape as directed. Electronic Signature(s) Signed: 04/15/2022 12:27:52 PM By: Fredirick Maudlin MD FACS Entered By: Fredirick Maudlin on 04/15/2022 10:39:18 -------------------------------------------------------------------------------- Problem List Details Patient Name: Date of Service: [name], [name] 04/15/2022 9:15 A M Medical Record Number: [id number] Patient Account Number: [id number] Date of Birth/Sex: Treating RN: 07/13/1976 (46 y.o. [name] Primary Care Provider: [name] Other Clinician: Referring Provider: Treating Provider/Extender: [name] in Treatment: 85 Active Problems ICD-10 Encounter Code Description Active Date MDM Diagnosis L97.522 Non-pressure chronic ulcer of other part of left foot with fat layer exposed 09/03/2020 No Yes Inactive Problems ICD-10 Code Description Active Date Inactive Date L97.512 Non-pressure chronic ulcer of other part of right foot with fat layer exposed 09/03/2020 09/03/2020 L89.893 Pressure ulcer of other site, stage 3 07/15/2020 07/15/2020 M62.81 Muscle weakness (generalized) 07/15/2020 07/15/2020 I10 Essential (primary) hypertension 07/15/2020 07/15/2020 M86.171 Other acute osteomyelitis, right ankle and foot 09/03/2020 09/03/2020 Resolved Problems Electronic Signature(s) Signed: 04/15/2022 10:32:03 AM By: Fredirick Maudlin MD FACS Entered By: Fredirick Maudlin on 04/15/2022 10:32:03 -------------------------------------------------------------------------------- Progress Note Details Patient Name: Date of Service: [name], [place] E. 04/15/2022 9:15 A M  Medical Record Number: [id number] Patient Account Number: [id number] Date of Birth/Sex: Treating RN: 1976-02-16 (46 y.o. [name] Primary Care Provider: [name] Other Clinician: Referring Provider: Treating Provider/Extender: [name] in  Treatment: 78 Subjective Chief Complaint Information obtained from Patient Bilateral Plantar Foot Ulcers History of Present Illness (HPI) Wounds are12/03/2020 upon evaluation today patient presents for initial inspection here in our clinic concerning issues he has been having with the bottoms of his feet bilaterally. He states these actually occurred as wounds when he was hospitalized for 5 months secondary to Covid. He was apparently with tilting bed where he was in an upright position quite frequently and apparently this occurred in some way shape or form during that time. Fortunately there is no sign of active infection at this time. No fevers, chills, nausea, vomiting, or diarrhea. With that being said he still has substantial wounds on the plantar aspects of his feet Theragen require quite a bit of work to get these to heal. He has been using Santyl currently though that is been problematic both in receiving the medication as well as actually paid for it as it is become quite expensive. Prior to the experience with Covid the patient really did not have any major medical problems other than hypertension he does have some mild generalized weakness following the Covid experience. 07/22/2020 on evaluation today patient appears to be doing okay in regard to his foot ulcers I feel like the wound beds are showing signs of better improvement that I do believe the Iodoflex is helping in this regard. With that being said he does have a lot of drainage currently and this is somewhat blue/green in nature which is consistent with Pseudomonas. I do think a culture today would be appropriate for [place] to evaluate and see if that is indeed the case I would likely start him on antibiotic orally as well he is not allergic to Cipro knows of no issues he has had in the past 12/21; patient was admitted to the clinic earlier this month with bilateral presumed pressure ulcers on the bottom of his feet apparently related  to excessive pressure from a tilt table arrangement in the intensive care unit. Patient relates this to being on ECMO but I am not really sure that is exactly related to that. I must say I have never seen anything like this. He has fairly extensive full-thickness wounds extending from his heel towards his midfoot mostly centered laterally. There is already been some healing distally. He does not appear to have an arterial issue. He has been using gentamicin to the wound surfaces with Iodoflex to help with ongoing debridement 1/6; this is a patient with pressure ulcers on the bottom of his feet related to excessive pressure from a standing position in the intensive care unit. He is complaining of a lot of pain in the right heel. He is not a diabetic. 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 We have been using Iodoflex to help prepare the surfaces of both wounds for an advanced treatment product. He is nonambulatory spending most of his time in a wheelchair I have asked him not to propel the wheelchair with his heels 1/13; in general his wounds look better not much surface area change we have been using Iodoflex as of last week. I did an x-ray of the right heel as the patient was complaining of pain. I had some thoughts about a stress fracture perhaps Achilles tendon problems however  what it showed was erosive changes along the inferior aspect of the calcaneus he now has a MRI booked for 1/20. 1/20; in general his wounds continue to be better. Some improvement in the large narrow areas proximally in his foot. He is still complaining of pain in the right heel and tenderness in certain areas of this wound. His MRI is tonight. I am not just looking for osteomyelitis that was brought up on the x-ray I am wondering about stress fractures, tendon ruptures etc. He has no such findings on the left. Also noteworthy is that the patient had critical illness neuropathy and some of the  discomfort may be actual improvement in nerve function I am just not sure. These wounds were initially in the setting of severe critical illness related to COVID-19. He was put in a standing position. He may have also been on pressors at the point contributing to tissue ischemia. By his description at some point these wounds were grossly necrotic extending proximally up into the Achilles part of his heel. I do not know that I have ever really seen pictures of them like this although they may exist in epic We have ordered Tri layer Oasis. I am trying to stimulate some granulation in these areas. This is of course assuming the MRI is negative for infection 1/27; since the patient was last here he saw Dr. Juleen China of infectious disease. He is planned for vancomycin and ceftriaxone. Prior operative culture grew MSSA. Also ordered baseline lab work. He also ordered arterial studies although the ABIs in our clinic were normal as well as his clinical exam these were normal I do not think he needs to see vascular surgery. His ABIs at the [facility] were 1.22 in the right triphasic waveforms with a normal TBI of 1.15 on the left ABI of 1.22 with triphasic waveforms and a normal TBI of 1.08. Finally he saw Dr. Amalia Hailey who will follow him in for 2 months. At this point I do not think he felt that he needed a procedure on the right calcaneal bone. Dr. Juleen China is elected for broad-spectrum antibiotic The patient is still having pain in the right heel. He walks with a walker 2/3; wounds are generally smaller. He is tolerating his IV antibiotics. I believe this is vancomycin and ceftriaxone. We are still waiting for Oasis burn in terms of his out-of-pocket max which he should be meeting soon given the IV antibiotics, MRIs etc. I have asked him to check in on this. We are using silver collagen in the meantime the wounds look better 2/10; tolerating IV vancomycin and Rocephin. We are waiting to apply for Oasis. Although I am  not really sure where he is in his out-of-pocket max. 2/17 started the first application of Oasis trilayer. Still on antibiotics. The wounds have generally look better. The area on the left has a little more surface slough requiring debridement [id number]; second application of Oasis trilayer. The wound surface granulation is generally look better. The area on the left with undermining laterally I think is come in a bit. 10/08/2020 upon evaluation today patient is here today for [employer] application #3. Fortunately he seems to be doing extremely well with regard to this and we are seeing a lot of new epithelial growth which is great news. Fortunately there is no signs of active infection at this time. 10/16/2020 upon evaluation today patient appears to be doing well with regard to his foot ulcers. Do believe the Oasis has been of benefit  for him. I do not see any signs of infection right now which is great news and I think that he has a lot of new epithelial growth which is great to see as well. The patient is very pleased to hear all of this. I do think we can proceed with the Oasis trilayer #4 today. 3/18; not as much improvement in these areas on his heels that I was hoping. I did reapply trilateral Oasis today the tissue looks healthier but not as much fill in as I was hoping. 3/25; better looking today I think this is come in a bit the tissue looks healthier. Triple layer Oasis reapplied #6 4/1; somewhat better looking definitely better looking surface not as much change in surface area as I was hoping. He may be spending more time [place] on days then he needs to although he does have heel offloading boots. Triple layer Oasis reapplied #7 4/7; unfortunately apparently [facility] will not approve any further Oasis which is unfortunate since the patient did respond nicely both in terms of the condition of the wound bed as well as surface area. There is still some drainage coming from the  wound but not a lot there does not appear to be any infection 4/15; we have been using Hydrofera Blue. He continues to have nice rims of epithelialization on the right greater than the left. The left the epithelialization is coming from the tip of his heel. There is moderate drainage. In this that concerns me about a total contact cast. There is no evidence of infection 4/29; patient has been using Hydrofera Blue with dressing changes. He has no complaints or issues today. 5/5; using Hydrofera Blue. I actually think that he looks marginally better than the last time I saw this 3 weeks ago. There are rims of epithelialization on the left thumb coming from the medial side on the right. Using Hydrofera Blue 5/12; using Hydrofera Blue. These continue to make improvements in surface area. His drainage was not listed as severe I therefore went ahead and put a cast on the left foot. Right foot we will continue to dress his previous 5/16; back for first total contact cast change. He did not tolerate this particularly well cast injury on the anterior tibia among other issues. Difficulty sleeping. I talked him about this in some detail and afterwards is elected to continue. I told him I would like to have a cast on for 3 weeks to see if this is going to help at all. I think he agreed 5/19; I think the wound is better. There is no tunneling towards his midfoot. The undermining medially also looks better. He has a rim of new skin distally. I think we are making progress here. The area on the left also continues to look somewhat better to me using Hydrofera Blue. He has a list of complaints about the cast but none of them seem serious 5/26; patient presents for 1 week follow-up. He has been using a total contact cast and tolerating this well. Hydrofera Blue is the main dressing used. He denies signs of infection. 6/2 Hydrofera Blue total contact cast on the left. These were large ulcers that formed in intensive  care unit where the patient was recovering from [place]. May have had something to do with being ventilated in an upright positiono Pressors etc. We have been able to get the areas down considerably and a viable surface. There is some epithelialization in both sides. Note made of  drainage 6/9; changed to [facility] last time because of drainage. He arrives with better looking surfaces and dimensions on the left than the right. Paradoxically the right actually probes more towards his midfoot the left is largely close down but both of these look improved. Using a total contact cast on the left 6/16; complex wounds on his bilateral plantar heels which were initially pressure injury from a stay in the ICU with COVID. We have been using silver alginate most recently. His dimensions of come in quite dramatically however not recently. We have been putting the left foot in a total contact cast 6/23; complex wounds on the bilateral plantar heels. I been putting the left in the cast paradoxically the area on the right is the one that is going towards closure at a faster rate. Quite a bit of drainage on the left. The patient went to see Dr. Amalia Hailey who said he was going to standby for skin grafts. I had actually considered sending him for skin grafts however he would be mandatorily off his feet for a period of weeks to months. I am thinking that the area on the right is going to close on its own the area on the left has been more stubborn even though we have him in a total contact cast 6/30; took him out of a total contact cast last week is the right heel seem to be making better progress than the left where I was placing the cast. We are using silver alginate. Both wounds are smaller right greater than left 7/12; both wounds look as though they are making some progress. We are using silver alginate. Heel offloading boots 7/26; very gradual progress especially on the right. Using silver alginate. He is wearing  heel offloading boots 8/18; he continues to close these wounds down very gradually. Using silver alginate. The problem polymen being definitive about this is areas of what appears to be callus around the margins. This is not a 100% of the area but certainly sizable especially on the right 9/1; bilateral plantar feet wounds secondary to prolonged pressure while being ventilated for COVID-19 in an upright position. Essentially pressure ulcers on the bottom of his feet. He is made substantial progress using silver alginate. 9/14; bilateral plantar feet wounds secondary to prolonged pressure. Making progress using silver alginate. 9/29 bilateral plantar feet wounds secondary to prolonged pressure. I changed him to Iodoflex last week. MolecuLight showing reddened blush fluorescence 10/11; patient presents for follow-up. He has no issues or complaints today. He denies signs of infection. He continues to use Iodoflex and antibiotic ointment to the wound beds. 10/27; 2-week follow-up. No evidence of infection. He has callus and thick dry skin around the wound margins we have been using Iodoflex and Bactroban which was in response to a moderate left MolecuLight reddish blush fluorescence. 11/10; 2-week follow-up. Wound margins again have thick callus however the measurements of the actual wound sites are a lot smaller. Everything looks reasonably healthy here. We have been using Iodoflex He was approved for prime matrix but I have elected to delay this given the improvement in the surface area. Hopefully I will not regret that decision as were getting close to the end of the year in terms of insurance payment 12/8; 2-week follow-up. Wounds are generally smaller in size. These were initially substantial wounds extending into the forefoot all the way into the heel on the bilateral plantar feet. They are now both located on the plantar heel distal aspect both of  these have a lot of callus around the wounds I  used a #5 curette to remove this on the right and the left also some subcutaneous debris to try and get the wound edges were using Iodoflex. He has heel offloading shoe 12/22; 2-week follow-up. Not really much improvement. He has thick callus around the outer edges of both wounds. I remove this there is some nonviable subcutaneous tissue as well. We have been using Iodoflex. Her intake nurse and myself spontaneously thought of a total contact cast I went back in May. At that time we really were not seeing much of an improvement with a cast although the wound was in a much different situation I would like to retry this in 2 weeks and I discussed this with the patient 08/12/2021; the patient has had some improvement with the Iodoflex. The the area on the left heel plantar more improved than the right. I had to put him in a total contact cast on the left although I decided to put that off for 2 weeks. I am going to change his primary dressing to silver collagen. I think in both areas he has had some improvement most of the healing seems to be more proximal in the heel. The wounds are in the mid aspect. A lot of thick callus on the right heel however. 1/19; we are using silver collagen on both plantar heel areas. He has had some improvement today. The left did not require any debridement. He still had some eschar on the right that was debrided but both seem to have contracted. I did not put it total contact cast on him today 2/2 we have been using silver collagen. The area on the right plantar heel has areas that appear to be epithelialized interspersed with dry flaking callus and dry skin. I removed this. This really looks better than on the other side. On the left still a large area with raised edges and debris on the surface. 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 He has  no issues or complaints today. 2/9; not much change in the left foot wound with 1 week of a cast we are using silver collagen. Silver collagen on the right side. The right side has been the better wound surface. We will reapply the total contact cast on the left 2/16; not much improvement on either side I been using silver collagen with a total contact cast on the left. I'm changing the Hydrofera Blue still with a total contact cast on the left 2/23; some improvement on both sides. Disappointing that he has thick callus around the area that we are putting in a total contact cast on the left. We've been using Hydrofera Blue on both wound areas. This is a man who at essentially pressure ulcers in addition to ischemia caused by medications to support his blood pressure (pressors) in the ICU. He was being ventilated in the standing position for severe Covid. A Shiley the wounds extended across his entire foot but are now localized to his plantar heels bilaterally. We have made progress however neither areas healed. I continue to think the total contact cast is helped albeit painstakingly slowly. He has never wanted a plastic surgery consult although I don't know that they would be interested in grafting in area in this location. 10/07/2021: Continued improvement bilaterally. There is still some callus  around the left wound, despite the total contact cast. He has some increased pain in his right midfoot around 1 particular area. This has been painful in the past but seems to be a little bit worse. When his cast was removed today, there was an area on the heel of the left foot that looks a bit macerated. He is also complaining of pain in his left thigh and hip which he thinks is secondary to the limb length discrepancy caused by the cast. 10/14/2021: He continues to improve. A little bit less callus around the left wound. He continues to endorse pain in his right midfoot, but this is not as significant as it  was last week. The maceration on his left heel is improved. 10/21/2021: Continued improvement to both wounds. The maceration on his left heel is no longer evident. Less callus bilaterally. Epithelialization progressing. 10/28/2021: Significant improvement this week. The right sided wound is nearly closed with just a small open area at the middle. No maceration seen on the left heel. Continued epithelialization on both sides. No concern for infection. 11/04/2021: T oday, the wounds were measured a little bit differently and come out as larger, but I actually think they are about the same to potentially even smaller, particularly on the left. He continues to accumulate some callus on the right. 11/11/2021: T oday, the patient is expressing some concern that the left wound, despite being in the total contact cast, is not progressing at the same rate as the 1 on the right. He is interested in trying a week without the cast to see how the wound does. The wounds are roughly the same size as last week, with the right perhaps being a little bit smaller. He continues to build up callus on both sites. 11/18/2021: Last week, I permitted the patient to go without his total contact cast, just to see if the cast was really making any difference. Today, both wounds have deteriorated to some extent, suggesting that the cast is providing benefit, at least on the left. Both are larger and have accumulated callus, slough, and other debris. 11/26/2021: I debrided both wounds quite aggressively last week in an effort to stimulate the healing cascade. This appears to have been effective as the left sided wound is a full centimeter shorter in length. Although the right was measured slightly larger, on inspection, it looks as though an area of epithelialized tissue was included in the measurements. We have been using PolyMem Ag on the wound surfaces with a total contact cast to the left. 12/02/2021: It appears that the intake  personnel are including epithelialized tissue in his wound measurements; the right wound is almost completely epithelialized; there is just a crater at the proximal midfoot with some open areas. On the left, he has built up some callus, but the overall wound surface looks good. There is some senescent skin around the wound margin. He has been in PolyMem Ag bilaterally with a total contact cast on the left. 12/09/2021: The right wound is nearly closed; there is just a small open area at the mid calcaneus. On the left, the wound is smaller with minimal callus buildup. No significant drainage. 12/16/2021: The right calcaneal wound remains minimally open at the mid calcaneus; the rest has epithelialized. On the left, the wound is also a little bit smaller. There is some senescent tissue on the periphery. He is getting his first application of a trial skin substitute called Vendaje today. 12/23/2021: The wound on his right  calcaneus is nearly closed; there is just a small area at the most distal aspect of the calcaneus that is open. On the left, the area where we applied to the skin substitute has a healthier-looking bed of granulation tissue. The wound dimensions are not significantly different on this side but the wound surface is improved. 12/30/2021: The wound on the right calcaneus has not changed significantly aside from some accumulation of callus. On the left, the open area is smaller and continues to have an improved surface. He continues to accumulate callus around the wound. He is here for his third application of Vendaje. 01/06/2022: The right calcaneal wound is down to just a couple of millimeters. He continues to accumulate periwound callus. He unfortunately got his cast wet earlier in the week and his left foot is macerated, resulting in some superficial skin loss just distal to the open wound. The open wound itself, however, is much smaller and has a healthier appearing surface. He is here for his  fourth application of Vendaje. 01/13/2022: The right calcaneal wound is about the same. Unfortunately, once again, his cast got wet and his foot is again macerated. This is caused the left calcaneal wound to enlarge. He is here for his fifth application of Vendaje. 01/20/2022: The right calcaneal wound is very small. There is some periwound callus accumulation. He purchased a new cast protector last week and this has been effective in avoiding the maceration that has been occurring on the left. The left calcaneal wound is narrower and has a healthy and viable-appearing surface. He is here for his 6 application of Vendaje. 01/27/2022: The right calcaneal wound is down to just a pinhole. There is some periwound slough and callus. On the left, the wound is narrower and shorter by about a centimeter. The surface is robust and viable-appearing. Unfortunately, the rep for the trial skin substitute product did not provide any for [place] to use today. 02/04/2022: The right calcaneal wound remains unchanged. There is more accumulated callus. On the left, although the intake nurse measured it a little bit longer, it looks about the same to me. The surface has a layer of slough, but underneath this, there is good granulation tissue. 02/10/2022: The right calcaneus wound is nearly closed. There is still some callus that builds up around the site. The left side looks about the same in terms of dimensions, but the surface is more robust and vital-appearing. 02/16/2022: The area of the right calcaneus that was nearly closed last week has closed, but there is a small opening at the mid foot where it looks like some moisture got retained and caused some reopening. The left foot wound is narrower and shallower. Both sites have a fair amount of periwound callus and eschar. 02/24/2022: The small midfoot opening on the right calcaneus is a little bit smaller today. The left foot wound is narrower and shallower. He continues  to accumulate periwound callus. No concern for infection. 03/01/2022: The patient came to clinic early because he showered and got his cast wet. Fortunately, there is no significant maceration to his foot but the callus softened and it looks like the wound on his left calcaneus may be a little bit wider. The wound on his right calcaneus is just a narrow slit. Continued accumulation of periwound callus bilaterally. 03/08/2022: The wound on his right calcaneus is very nearly closed, just a small pinpoint opening under a bit of eschar; the left wound has come in quite a bit, as well. It  is narrower and shorter than at our last visit. Still with accumulated callus and eschar bilaterally. 03/17/2022: The right calcaneal wound is healed. The left wound is smaller and the surface itself is very clean, but there is some blue-green staining on the periwound callus, concerning for Pseudomonas aeruginosa. 03/23/2022: The right calcaneal wound remains closed. The left wound continues to contract. No further blue-green staining. Small amount of callus and slough accumulation. 03/28/2022: He came in early today because he had gotten his cast wet. On inspection, the wound itself did not get wet or macerated, just a little bit of the forefoot. The wound itself is basically unchanged. 04/07/2022: The right foot wound remains closed. The left wound is the smallest that I have seen it to date. It is narrower and shorter. It still continues to accumulate slough on the surface. 04/15/2022: There is a band of epithelium now dividing the small left plantar foot wound in 2. There is still some slough on the surface. Patient History Information obtained from Patient. Family History Cancer - Maternal Grandparents, Diabetes - Father,Paternal Grandparents, Heart Disease - Maternal Grandparents, Hypertension - Father,Paternal Grandparents, Lung Disease - Siblings, No family history of Hereditary Spherocytosis, Kidney Disease,  Seizures, Stroke, Thyroid Problems, Tuberculosis. Social History Never smoker, Marital Status - Married, Alcohol Use - Never, Drug Use - No History, Caffeine Use - Daily - tea, soda. Medical History Eyes Denies history of Cataracts, Glaucoma, Optic Neuritis Ear/Nose/Mouth/Throat Denies history of Chronic sinus problems/congestion, Middle ear problems Hematologic/Lymphatic Denies history of Anemia, Hemophilia, Human Immunodeficiency Virus, Lymphedema, Sickle Cell Disease Respiratory Patient has history of Asthma Denies history of Aspiration, Chronic Obstructive Pulmonary Disease (COPD), Pneumothorax, Sleep Apnea, Tuberculosis Cardiovascular Patient has history of Angina - with COVID, Hypertension Denies history of Arrhythmia, Congestive Heart Failure, Coronary Artery Disease, Deep Vein Thrombosis, Hypotension, Myocardial Infarction, Peripheral Arterial Disease, Peripheral Venous Disease, Phlebitis, Vasculitis Gastrointestinal Denies history of Cirrhosis , Colitis, Crohnoos, Hepatitis A, Hepatitis B, Hepatitis C Endocrine Denies history of Type I Diabetes, Type II Diabetes Genitourinary Denies history of End Stage Renal Disease Immunological Denies history of Lupus Erythematosus, Raynaudoos, Scleroderma Integumentary (Skin) Denies history of History of Burn Musculoskeletal Denies history of Gout, Rheumatoid Arthritis, Osteoarthritis, Osteomyelitis Neurologic Denies history of Dementia, Neuropathy, Quadriplegia, Paraplegia, Seizure Disorder Oncologic Denies history of Received Chemotherapy, Received Radiation Psychiatric Denies history of Anorexia/bulimia, Confinement Anxiety Hospitalization/Surgery History - COVID PNA 07/22/2019- 11/14/2019. - 03/27/2020 wound debridement/ skin graft. Medical A Surgical History Notes nd Constitutional Symptoms (General Health) COVID PNA 07/22/2019-11/14/2019 [facility] ECMO, foot drop left foot , Genitourinary kidney  stone Psychiatric anxiety Objective Constitutional Hypertensive, asymptomatic. No acute distress.. Vitals Time Taken: 9:37 AM, Height: 69 in, Weight: 280 lbs, BMI: 41.3, Temperature: 98.1 F, Pulse: 74 bpm, Respiratory Rate: 18 breaths/min, Blood Pressure: 161/97 mmHg. Respiratory Normal work of breathing on room air.. General Notes: 04/15/2022: There is a band of epithelium now dividing the small left plantar foot wound in 2. There is still some slough on the surface. Integumentary (Hair, Skin) Wound #2 status is Open. Original cause of wound was Pressure Injury. The date acquired was: 10/07/2019. The wound has been in treatment 91 weeks. The wound is located on the Left Calcaneus. The wound measures 0.3cm length x 0.2cm width x 0.2cm depth; 0.047cm^2 area and 0.009cm^3 volume. There is Fat Layer (Subcutaneous Tissue) exposed. There is no tunneling or undermining noted. There is a medium amount of serosanguineous drainage noted. The wound margin is thickened. There is medium (34-66%) red granulation within the  wound bed. There is a medium (34-66%) amount of necrotic tissue within the wound bed including Adherent Slough. Assessment Active Problems ICD-10 Non-pressure chronic ulcer of other part of left foot with fat layer exposed Procedures Wound #2 Pre-procedure diagnosis of Wound #2 is a Pressure Ulcer located on the Left Calcaneus . There was a Selective/Open Wound Non-Viable Tissue Debridement with a total area of 0.06 sq cm performed by Fredirick Maudlin, MD. With the following instrument(s): Curette to remove Non-Viable tissue/material. Material removed includes Callus and Slough and after achieving pain control using Lidocaine 4% T opical Solution. No specimens were taken. A time out was conducted at 10:01, prior to the start of the procedure. A Minimum amount of bleeding was controlled with Pressure. The procedure was tolerated well with a pain level of 0 throughout and a pain level of  0 following the procedure. Post Debridement Measurements: 0.3cm length x 0.2cm width x 0.1cm depth; 0.005cm^3 volume. Post debridement Stage noted as Category/Stage III. Character of Wound/Ulcer Post Debridement is improved. Post procedure Diagnosis Wound #2: Same as Pre-Procedure Pre-procedure diagnosis of Wound #2 is a Pressure Ulcer located on the Left Calcaneus . There was a T Contact Cast Procedure by Fredirick Maudlin, MD. otal Post procedure Diagnosis Wound #2: Same as Pre-Procedure Plan Follow-up Appointments: Return Appointment in 1 week. - Dr [name] Room 3 Thursday 9/14 at 9:30am Thursday 04/28/22 at 9:30am Anesthetic: Wound #2 Left Calcaneus: (In clinic) Topical Lidocaine 4% applied to wound bed - In clinic Bathing/ Shower/ Hygiene: May shower with protection but do not get wound dressing(s) wet. - Cover with cast protector (can purchase cast protector at [facility] or [facility] ) Edema Control - Lymphedema / SCD / Other: Elevate legs to the level of the heart or above for 30 minutes daily and/or when sitting, a frequency of: - throughout the day Avoid standing for long periods of time. Moisturize legs daily. - right leg and foot every night. Off-Loading: T Contact Cast to Left Lower Extremity otal Other: - keep pressure off of the bottom of your feet Additional Orders / Instructions: Follow Nutritious Diet WOUND #2: - Calcaneus Wound Laterality: Left Cleanser: Normal Saline (Generic) 1 x Per Week/30 Days Discharge Instructions: Cleanse the wound with Normal Saline prior to applying a clean dressing using gauze sponges, not tissue or cotton balls. Cleanser: Wound Cleanser 1 x Per Week/30 Days Discharge Instructions: Cleanse the wound with wound cleanser prior to applying a clean dressing using gauze sponges, not tissue or cotton balls. Prim Dressing: Endoform 2x2 in 1 x Per Week/30 Days ary Discharge Instructions: Moisten with saline Secondary Dressing: Optifoam Non-Adhesive  Dressing, 4x4 in (Generic) 1 x Per Week/30 Days Discharge Instructions: Apply over primary dressing as directed. Secondary Dressing: Woven Gauze Sponge, Non-Sterile 4x4 in 1 x Per Week/30 Days Discharge Instructions: Apply over primary dressing as directed. Secured With: 39M Medipore H Soft Cloth Surgical T ape, 4 x 10 (in/yd) (Generic) 1 x Per Week/30 Days Discharge Instructions: Secure with tape as directed. 04/15/2022: There is a band of epithelium now dividing the small left plantar foot wound in 2. There is still some slough on the surface. The slough was removed from the wound and endoform and a total contact cast were applied. I think at this point he could contact podiatry to have a custom orthotic made for his right foot to prevent further tissue damage, as he no longer has any sort of fat pad on the bottom of his foot. He will follow-up with  me in 1 week. Electronic Signature(s) Signed: 04/15/2022 10:40:10 AM By: Fredirick Maudlin MD FACS Entered By: Fredirick Maudlin on 04/15/2022 10:40:10 -------------------------------------------------------------------------------- HxROS Details Patient Name: Date of Service: [name], [place] 04/15/2022 9:15 A M Medical Record Number: [id number] Patient Account Number: [id number] Date of Birth/Sex: Treating RN: 1976/02/07 (46 y.o. [name] Primary Care Provider: [name] Other Clinician: Referring Provider: Treating Provider/Extender: [name] in Treatment: 18 Information Obtained From Patient Constitutional Symptoms (General Health) Medical History: Past Medical History Notes: COVID PNA 07/22/2019-11/14/2019 [facility] ECMO, foot drop left foot , Eyes Medical History: Negative for: Cataracts; Glaucoma; Optic Neuritis Ear/Nose/Mouth/Throat Medical History: Negative for: Chronic sinus problems/congestion; Middle ear problems Hematologic/Lymphatic Medical History: Negative for: Anemia; Hemophilia; Human  Immunodeficiency Virus; Lymphedema; Sickle Cell Disease Respiratory Medical History: Positive for: Asthma Negative for: Aspiration; Chronic Obstructive Pulmonary Disease (COPD); Pneumothorax; Sleep Apnea; Tuberculosis Cardiovascular Medical History: Positive for: Angina - with COVID; Hypertension Negative for: Arrhythmia; Congestive Heart Failure; Coronary Artery Disease; Deep Vein Thrombosis; Hypotension; Myocardial Infarction; Peripheral Arterial Disease; Peripheral Venous Disease; Phlebitis; Vasculitis Gastrointestinal Medical History: Negative for: Cirrhosis ; Colitis; Crohns; Hepatitis A; Hepatitis B; Hepatitis C Endocrine Medical History: Negative for: Type I Diabetes; Type II Diabetes Genitourinary Medical History: Negative for: End Stage Renal Disease Past Medical History Notes: kidney stone Immunological Medical History: Negative for: Lupus Erythematosus; Raynauds; Scleroderma Integumentary (Skin) Medical History: Negative for: History of Burn Musculoskeletal Medical History: Negative for: Gout; Rheumatoid Arthritis; Osteoarthritis; Osteomyelitis Neurologic Medical History: Negative for: Dementia; Neuropathy; Quadriplegia; Paraplegia; Seizure Disorder Oncologic Medical History: Negative for: Received Chemotherapy; Received Radiation Psychiatric Medical History: Negative for: Anorexia/bulimia; Confinement Anxiety Past Medical History Notes: anxiety Immunizations Pneumococcal Vaccine: Received Pneumococcal Vaccination: No Implantable Devices None Hospitalization / Surgery History Type of Hospitalization/Surgery COVID PNA 07/22/2019- 11/14/2019 03/27/2020 wound debridement/ skin graft Family and Social History Cancer: Yes - Maternal Grandparents; Diabetes: Yes - Father,Paternal Grandparents; Heart Disease: Yes - Maternal Grandparents; Hereditary Spherocytosis: No; Hypertension: Yes - Father,Paternal Grandparents; Kidney Disease: No; Lung Disease: Yes -  Siblings; Seizures: No; Stroke: No; Thyroid Problems: No; Tuberculosis: No; Never smoker; Marital Status - Married; Alcohol Use: Never; Drug Use: No History; Caffeine Use: Daily - tea, soda; Financial Concerns: No; Food, Clothing or Shelter Needs: No; Support System Lacking: No; Transportation Concerns: No Engineer, maintenance) Signed: 04/15/2022 12:27:52 PM By: Fredirick Maudlin MD FACS Signed: 04/15/2022 5:34:33 PM By: [name] Entered By: Fredirick Maudlin on 04/15/2022 10:38:38 -------------------------------------------------------------------------------- Total Contact Cast Details Patient Name: Date of Service: [name], [place] 04/15/2022 9:15 A M Medical Record Number: [id number] Patient Account Number: [id number] Date of Birth/Sex: Treating RN: [birth year] (46 y.o. [name] Primary Care Provider: Other Clinician: [name] Referring Provider: Treating Provider/Extender: [name] in Treatment: 84 T Contact Cast Applied for Wound Assessment: otal Wound #2 Left Calcaneus Performed By: Physician Fredirick Maudlin, MD Post Procedure Diagnosis Same as Pre-procedure Electronic Signature(s) Signed: 04/15/2022 12:27:52 PM By: Fredirick Maudlin MD FACS Signed: 04/15/2022 5:34:33 PM By: [name] Entered By: [name] on 04/15/2022 10:24:41 -------------------------------------------------------------------------------- Radar Base Details Patient Name: Date of Service: [name], [place] 04/15/2022 Medical Record Number: [id number] Patient Account Number: [id number] Date of Birth/Sex: Treating RN: 1976-04-03 (46 y.o. [name] Primary Care Provider: [name] Other Clinician: Referring Provider: Treating Provider/Extender: [name] in Treatment: 49 Diagnosis Coding ICD-10 Codes Code Description [phone] Non-pressure chronic ulcer of other part of left foot with fat layer  exposed Facility  Procedures CPT4 Code: [id number] Description: [phone] - APPLY TOTAL CONTACT LEG CAST ICD-10 Diagnosis Description L97.522 Non-pressure chronic ulcer of other part of left foot with fat layer exposed Modifier: Quantity: 1 Physician Procedures : CPT4 Code Description Modifier [id number] - WC PHYS LEVEL 3 - EST PT 25 ICD-10 Diagnosis Description L97.522 Non-pressure chronic ulcer of other part of left foot with fat layer exposed Quantity: 1 : [id number] - WC PHYS APPLY TOTAL CONTACT CAST ICD-10 Diagnosis Description L97.522 Non-pressure chronic ulcer of other part of left foot with fat layer exposed Quantity: 1 Electronic Signature(s) Signed: 04/15/2022 10:40:36 AM By: Fredirick Maudlin MD FACS Entered By: Fredirick Maudlin on 04/15/2022 10:40:35

## 2022-04-19 ENCOUNTER — Ambulatory Visit (INDEPENDENT_AMBULATORY_CARE_PROVIDER_SITE_OTHER): Payer: PPO | Admitting: Behavioral Health

## 2022-04-19 ENCOUNTER — Encounter: Payer: Self-pay | Admitting: Behavioral Health

## 2022-04-19 DIAGNOSIS — F329 Major depressive disorder, single episode, unspecified: Secondary | ICD-10-CM

## 2022-04-19 DIAGNOSIS — F4321 Adjustment disorder with depressed mood: Secondary | ICD-10-CM

## 2022-04-19 DIAGNOSIS — F331 Major depressive disorder, recurrent, moderate: Secondary | ICD-10-CM | POA: Diagnosis not present

## 2022-04-19 DIAGNOSIS — F39 Unspecified mood [affective] disorder: Secondary | ICD-10-CM | POA: Diagnosis not present

## 2022-04-19 DIAGNOSIS — F411 Generalized anxiety disorder: Secondary | ICD-10-CM | POA: Diagnosis not present

## 2022-04-19 NOTE — Progress Notes (Signed)
[name] [id number] 1976/03/18 46 y.o.  Virtual Visit via Telephone Note  I connected with pt on 04/19/22 at  2:00 PM EDT by telephone and verified that I am speaking with the correct person using two identifiers.   I discussed the limitations, risks, security and privacy concerns of performing an evaluation and management service by telephone and the availability of in person appointments. I also discussed with the patient that there may be a patient responsible charge related to this service. The patient expressed understanding and agreed to proceed.   I discussed the assessment and treatment plan with the patient. The patient was provided an opportunity to ask questions and all were answered. The patient agreed with the plan and demonstrated an understanding of the instructions.   The patient was advised to call back or seek an in-person evaluation if the symptoms worsen or if the condition fails to improve as anticipated.  I provided 30 minutes of non-face-to-face time during this encounter.  The patient was located at home.  The provider was located at [facility].   Joan Flores, NP   Subjective:   Patient ID:  [name] is a 46 y.o. (DOB [birth year]) male.  Chief Complaint:  Chief Complaint  Patient presents with   Depression   Anxiety   Medication Problem   Medication Refill   Patient Education   Stress    HPI  46 year old male presents to this office via telephonic interview for follow up and medicaiton management. Says that his depression and anxiety have improved moderately. Says that he messed up and stopped taking the Auvelity not knowing  if he should continue. He agrees to restart the medication now.  He says his depression is 5/10 and anxiety is 4/10. He is sleeping 7-8 hours per night. He would like to adjust or try another medication to help.  Denies mania, no psychosis, no current SI. No HI.    No prior psychiatric medication  trials  Review of Systems:  Review of Systems  Constitutional: Negative.   Allergic/Immunologic: Negative.   Neurological: Negative.   Psychiatric/Behavioral:  Positive for dysphoric mood.     Medications: I have reviewed the patient's current medications.  Current Outpatient Medications  Medication Sig Dispense Refill   DULoxetine HCl 40 MG CPEP TAKE TWO CAPSULES AT ONCE DAILY IN THE MORNING AFTER BREAKFAST. 180 capsule 0   albuterol (VENTOLIN HFA) 108 (90 Base) MCG/ACT inhaler INHALE 2 PUFFS INTO THE LUNGS EVERY 4 HOURS AS NEEDED FOR WHEEZING OR SHORTNESS OF BREATH. 18 g 1   Albuterol Sulfate (PROAIR RESPICLICK) 108 (90 Base) MCG/ACT AEPB INHALE 2 PUFF(S) EVERY 4-6 HOURS BY INHALATION ROUTE FOR 30 DAYS.     alum & mag hydroxide-simeth (MAALOX MAX) 400-400-40 MG/5ML suspension Take 30 mL as needed by oral route for 1 day. (Patient not taking: Reported on 04/01/2022)     amLODipine (NORVASC) 2.5 MG tablet Take 1 tablet (2.5 mg total) by mouth daily. 30 tablet 5   ARIPiprazole (ABILIFY) 2 MG tablet 1 tablet Orally Once a day at night for 90     budesonide-formoterol (SYMBICORT) 160-4.5 MCG/ACT inhaler INHALE TWO PUFFS TWICE DAILY TO PREVENT COUGH OR WHEEZE. RINSE, GARGLE, AND SPIT AFTER USE.     cyclobenzaprine (FLEXERIL) 10 MG tablet 1 tablet p.o b.i.d p.r.n (Patient not taking: Reported on 04/01/2022)     cyclobenzaprine (FLEXERIL) 5 MG tablet Take 1 tablet 3 times a day by oral route as needed for 10  days. (Patient not taking: Reported on 04/01/2022)     Dextromethorphan-buPROPion ER (AUVELITY) 45-105 MG TBCR Take one tablet for 3 days, then take two tablets (90-210 mg) total daily in the am. (Patient not taking: Reported on 04/01/2022) 60 tablet 3   diclofenac (VOLTAREN) 50 MG EC tablet      DULoxetine (CYMBALTA) 60 MG capsule 1 capsule Orally Once a day for 90     ergocalciferol (VITAMIN D2) 1.25 MG (50000 UT) capsule  (Patient not taking: Reported on 04/01/2022)     ibuprofen (ADVIL) 200  MG tablet Take 1 tablet every 6 hours by oral route.     lamoTRIgine (LAMICTAL) 100 MG tablet Take 1.5 tablets (150 mg total) by mouth daily. 30 tablet 3   lamoTRIgine (LAMICTAL) 25 MG tablet TAKE TWO TABLETS BY MOUTH AT ONCE DAILY. 180 tablet 0   metoprolol tartrate (LOPRESSOR) 25 MG tablet TAKE 1 TABLET BY MOUTH TWICE A DAY 180 tablet 3   montelukast (SINGULAIR) 10 MG tablet Take 1 tablet (10 mg total) by mouth daily. 30 tablet 5   pantoprazole (PROTONIX) 40 MG tablet Take 1 tablet by mouth daily.     traZODone (DESYREL) 100 MG tablet Take 2 tablets (200 mg total) by mouth at bedtime. 180 tablet 3   triamcinolone (NASACORT) 55 MCG/ACT AERO nasal inhaler Place 2 sprays into the nose daily.     No current facility-administered medications for this visit.    Medication Side Effects: None  Allergies: No Known Allergies  Past Medical History:  Diagnosis Date   Anginal pain (HCC)    with covid   Anxiety    Asthma    Dyspnea    GERD (gastroesophageal reflux disease)    Headache    History of kidney stones    LEFT URETERAL STONE   HTN (hypertension)    Pancreatitis 2018   GALLBALDDER SLUDGE CAUSED ISSUED RESOLVED   Pneumonia 07/2019   covid    Family History  Problem Relation Age of Onset   Asthma Brother    Diabetes Father    Hypertension Father    Diabetes Paternal Grandmother    Hypertension Paternal Grandmother    Migraines Mother    GER disease Mother    Pancreatic cancer Maternal Grandmother    Heart disease Maternal Grandfather     Social History   Socioeconomic History   Marital status: Married    Spouse name: Not on file   Number of children: 1   Years of education: Not on file   Highest education level: Not on file  Occupational History   Occupation: delivery driver  Tobacco Use   Smoking status: Never   Smokeless tobacco: Never  Vaping Use   Vaping Use: Never used  Substance and Sexual Activity   Alcohol use: Yes    Comment: rarely 2-3 times a year    Drug use: No   Sexual activity: Yes    Partners: Female  Other Topics Concern   Not on file  Social History Narrative   Not on file   Social Determinants of Health   Financial Resource Strain: Not on file  Food Insecurity: Not on file  Transportation Needs: Not on file  Physical Activity: Not on file  Stress: Not on file  Social Connections: Not on file  Intimate Partner Violence: Not on file    Past Medical History, Surgical history, Social history, and Family history were reviewed and updated as appropriate.   Please see review of systems for  further details on the patient's review from today.   Objective:   Physical Exam:  There were no vitals taken for this visit.  Physical Exam Constitutional:      General: He is not in acute distress.    Appearance: Normal appearance.  Neurological:     Mental Status: He is alert and oriented to person, place, and time.     Gait: Gait normal.  Psychiatric:        Attention and Perception: Attention and perception normal. He does not perceive auditory or visual hallucinations.        Mood and Affect: Mood and affect normal. Mood is not anxious or depressed. Affect is not labile.        Speech: Speech normal.        Behavior: Behavior normal. Behavior is cooperative.        Thought Content: Thought content normal.        Cognition and Memory: Cognition and memory normal.        Judgment: Judgment normal.     Lab Review:     Component Value Date/Time   NA 140 08/31/2020 0000   K 4.2 08/31/2020 0000   CL 103 08/31/2020 0000   CO2 28 08/31/2020 0000   GLUCOSE 129 (H) 08/31/2020 0000   BUN 20 08/31/2020 0000   CREATININE 1.10 08/31/2020 0000   CALCIUM 9.1 08/31/2020 0000   PROT 7.3 08/31/2020 0000   ALBUMIN 2.9 (L) 12/02/2019 0628   AST 14 08/31/2020 0000   ALT 11 08/31/2020 0000   ALKPHOS 76 12/02/2019 0628   BILITOT 0.3 08/31/2020 0000   GFRNONAA 80 08/31/2020 0000   GFRAA 93 08/31/2020 0000       Component  Value Date/Time   WBC 8.5 03/29/2022 1823   WBC 7.7 08/31/2020 0000   RBC 5.74 03/29/2022 1823   RBC 5.66 08/31/2020 0000   HGB 15.8 03/29/2022 1823   HCT 48.8 03/29/2022 1823   PLT 138 (L) 08/31/2020 0000   MCV 85 03/29/2022 1823   MCH 27.5 03/29/2022 1823   MCH 28.3 08/31/2020 0000   MCHC 32.4 03/29/2022 1823   MCHC 33.8 08/31/2020 0000   RDW 12.7 03/29/2022 1823   LYMPHSABS 2.0 03/29/2022 1823   MONOABS 0.7 12/02/2019 0628   EOSABS 0.2 03/29/2022 1823   BASOSABS 0.1 03/29/2022 1823    No results found for: "POCLITH", "LITHIUM"   No results found for: "PHENYTOIN", "PHENOBARB", "VALPROATE", "CBMZ"   .res Assessment: Plan:    RECOMMENDATIONS:    Greater than 50% of 30 min face to face time with patient was spent on counseling and coordination of care.  We discussed patients chronic health concerns and his continue wound care for feet. Reporting his anxiety and depression have improved some. Still having some marital problems. He is continuing in psychotherapy.   Discussed medication options, side effects and his goals for tx.  We agreed to: To continue Auvelity 90-210 daily. Continue  Cymbalta to 80 mg daily. Will take two 40 mg capsule at once.  Continue Trazodone 200 mg at bedtime Continue Lamictal to 150 mg daily, patients request.  Will report any side effects or worsening symptoms To follow up in 8 weeks to reassess Provided emergency contact information Reviewed PDMP     Joan Flores, NP               There are no diagnoses linked to this encounter.  Please see After Visit Summary for patient specific instructions.  Future Appointments  Date Time Provider Department Center  04/21/2022  9:30 AM [name], MD [facility]  04/28/2022  9:30 AM [name], MD [facility]  05/09/2022  3:15 PM Felecia Shelling, DPM [facility]  06/14/2022  9:30 AM Joan Flores, NP [facility] None  07/08/2022  9:20 AM Lovorn, Aundra Millet, MD [facility]     No orders of the defined types were placed in this encounter.     -------------------------------

## 2022-04-20 DIAGNOSIS — F419 Anxiety disorder, unspecified: Secondary | ICD-10-CM | POA: Diagnosis not present

## 2022-04-20 DIAGNOSIS — R7309 Other abnormal glucose: Secondary | ICD-10-CM | POA: Diagnosis not present

## 2022-04-20 DIAGNOSIS — I10 Essential (primary) hypertension: Secondary | ICD-10-CM | POA: Diagnosis not present

## 2022-04-20 DIAGNOSIS — Z125 Encounter for screening for malignant neoplasm of prostate: Secondary | ICD-10-CM | POA: Diagnosis not present

## 2022-04-20 DIAGNOSIS — R7989 Other specified abnormal findings of blood chemistry: Secondary | ICD-10-CM | POA: Diagnosis not present

## 2022-04-21 ENCOUNTER — Encounter (HOSPITAL_BASED_OUTPATIENT_CLINIC_OR_DEPARTMENT_OTHER): Payer: PPO | Admitting: General Surgery

## 2022-04-21 DIAGNOSIS — L97422 Non-pressure chronic ulcer of left heel and midfoot with fat layer exposed: Secondary | ICD-10-CM | POA: Diagnosis not present

## 2022-04-21 DIAGNOSIS — L89623 Pressure ulcer of left heel, stage 3: Secondary | ICD-10-CM | POA: Diagnosis not present

## 2022-04-21 NOTE — Progress Notes (Signed)
[name] ([id number]) Visit Report for 04/21/2022 Arrival Information Details Patient Name: Date of Service: [name], [name] 04/21/2022 9:30 A M Medical Record Number: [id number] Patient Account Number: [id number] Date of Birth/Sex: Treating RN: [birth year][name] (46 y.o. M) Primary Care [name]: [name] Other Clinician: Referring [name]: Treating [name]/Extender: [name] in Treatment: 23 Visit Information History Since Last Visit Added or deleted any medications: No Patient Arrived: Wheel Chair Any new allergies or adverse reactions: No Arrival Time: 09:39 Had a fall or experienced change in No Transfer Assistance: None activities of daily living that [name] affect Patient Identification Verified: Yes risk of falls: Secondary Verification Process Completed: Yes Signs or symptoms of abuse/neglect since last No Patient Requires Transmission-Based Precautions: No visito Patient Has Alerts: No Hospitalized since last visit: No Implantable device outside of the clinic No excluding cellular tissue based products placed in the center since last visit: Has Footwear/Offloading in Place as Yes Prescribed: Left: Removable Cast Walker/Walking Boot Pain Present Now: Yes Electronic Signature(s) Signed: 04/21/2022 4:42:06 PM By: [name] Entered By: [name] on 04/21/2022 09:40:16 -------------------------------------------------------------------------------- Encounter Discharge Information Details Patient Name: Date of Service: [name], [name] 04/21/2022 9:30 A M Medical Record Number: [id number] Patient Account Number: [id number] Date of Birth/Sex: Treating RN: [name] (46 y.o. [name] Primary Care Bentleigh Stankus: [name] Other Clinician: Referring [name]: Treating [name]/Extender: [name] in Treatment: 49 Encounter Discharge Information Items Post Procedure Vitals Discharge Condition:  Stable Temperature (F): 98.4 Ambulatory Status: Wheelchair Pulse (bpm): 98 Discharge Destination: Home Respiratory Rate (breaths/min): 18 Transportation: Private Auto Blood Pressure (mmHg): 150/87 Accompanied By: spouse Schedule Follow-up Appointment: Yes Clinical Summary of Care: Patient Declined Notes RN transported the patient by wheelchair to his relative's car on 1st floor Electronic Signature(s) Signed: 04/21/2022 5:35:48 PM By: [name] Catholic RN Signed: 04/21/2022 5:35:48 PM By: [name] Catholic RN Entered By: [name] on 04/21/2022 11:42:14 -------------------------------------------------------------------------------- Lower Extremity Assessment Details Patient Name: Date of Service: [name], [name] 04/21/2022 9:30 A M Medical Record Number: [id number] Patient Account Number: [id number] Date of Birth/Sex: Treating RN: [name] (46 y.o. M) Primary Care Jeriko Kowalke: [name] Other Clinician: Referring [name]: Treating [name]/Extender: [name] in Treatment: 92 Edema Assessment Assessed: [name]: No] [Right: No] Edema: [Left: N] [Right: o] Calf Left: Right: Point of Measurement: 29 cm From Medial Instep 41.9 cm 39 cm Ankle Left: Right: Point of Measurement: 9 cm From Medial Instep 25 cm 25 cm Electronic Signature(s) Signed: 04/21/2022 4:42:06 PM By: [name] Entered By: [name] on 04/21/2022 10:02:26 -------------------------------------------------------------------------------- Multi Wound Chart Details Patient Name: Date of Service: [name], Greasy 04/21/2022 9:30 A M Medical Record Number: [id number] Patient Account Number: [id number] Date of Birth/Sex: Treating RN: [birth year] [name] (46 y.o. M) Primary Care Azelia Reiger: [name] Other Clinician: Referring [name]: Treating [name]/Extender: [name] in Treatment: 18 Vital Signs Height(in): 69 Pulse(bpm): 98 Weight(lbs):  280 Blood Pressure(mmHg): 150/87 Body Mass Index(BMI): 41.3 Temperature(F): 98.4 Respiratory Rate(breaths/min): 18 Photos: [N/A:N/A] Left Calcaneus N/A N/A Wound Location: Pressure Injury N/A N/A Wounding Event: Pressure Ulcer N/A N/A Primary Etiology: Asthma, Angina, Hypertension N/A N/A Comorbid History: 10/07/2019 N/A N/A Date Acquired: 60 N/A N/A Weeks of Treatment: Open N/A N/A Wound Status: No N/A N/A Wound Recurrence: 0.2x0.3x0.2 N/A N/A Measurements L x W x D (cm) 0.047 N/A N/A A (cm) : rea 0.009 N/A N/A Volume (cm) : 99.80% N/A N/A % Reduction in  A rea: 100.00% N/A N/A % Reduction in Volume: Category/Stage III N/A N/A Classification: Medium N/A N/A Exudate A mount: Serosanguineous N/A N/A Exudate Type: red, brown N/A N/A Exudate Color: Thickened N/A N/A Wound Margin: Medium (34-66%) N/A N/A Granulation A mount: Red N/A N/A Granulation Quality: Medium (34-66%) N/A N/A Necrotic A mount: Fascia: No N/A N/A Exposed Structures: Fat Layer (Subcutaneous Tissue): No Tendon: No Muscle: No Joint: No Bone: No Small (1-33%) N/A N/A Epithelialization: Debridement - Selective/Open Wound N/A N/A Debridement: Pre-procedure Verification/Time Out 10:22 N/A N/A Taken: Lidocaine 4% Topical Solution N/A N/A Pain Control: Slough N/A N/A Tissue Debrided: Non-Viable Tissue N/A N/A Level: 0.06 N/A N/A Debridement A (sq cm): rea Curette N/A N/A Instrument: Minimum N/A N/A Bleeding: Pressure N/A N/A Hemostasis A chieved: 0 N/A N/A Procedural Pain: 0 N/A N/A Post Procedural Pain: Procedure was tolerated well N/A N/A Debridement Treatment Response: 0.2x0.3x0.2 N/A N/A Post Debridement Measurements L x W x D (cm) 0.009 N/A N/A Post Debridement Volume: (cm) Category/Stage III N/A N/A Post Debridement Stage: Debridement N/A N/A Procedures Performed: T Contact Cast otal Treatment Notes Electronic Signature(s) Signed: 04/21/2022 10:40:07 AM By:  [name] Guess MD FACS Entered By: [name] on 04/21/2022 10:40:07 -------------------------------------------------------------------------------- Multi-Disciplinary Care Plan Details Patient Name: Date of Service: [name], [name] 04/21/2022 9:30 A M Medical Record Number: [id number] Patient Account Number: [id number] Date of Birth/Sex: Treating RN: [name] (46 y.o. [name] Primary Care Ahria Slappey: Dorinda Hill Other Clinician: Referring Seana Underwood: Treating Mariette Cowley/Extender: Jana Hakim in Treatment: 70 Multidisciplinary Care Plan reviewed with physician Active Inactive Wound/Skin Impairment Nursing Diagnoses: Impaired tissue integrity Knowledge deficit related to ulceration/compromised skin integrity Goals: Patient/caregiver will verbalize understanding of skin care regimen Date Initiated: 07/15/2020 Target Resolution Date: 07/07/2022 Goal Status: Active Ulcer/skin breakdown will have a volume reduction of 30% by week 4 Date Initiated: 07/15/2020 Date Inactivated: 08/20/2020 Target Resolution Date: 09/03/2020 Goal Status: Unmet Unmet Reason: no major changes. Ulcer/skin breakdown will heal within 14 weeks Date Initiated: 12/04/2020 Date Inactivated: 12/10/2020 Target Resolution Date: 12/10/2020 Unmet Reason: wounds still open at 14 Goal Status: Unmet weeks and today 21 weeks. Interventions: Assess patient/caregiver ability to obtain necessary supplies Assess patient/caregiver ability to perform ulcer/skin care regimen upon admission and as needed Assess ulceration(s) every visit Provide education on ulcer and skin care Treatment Activities: Skin care regimen initiated : 07/15/2020 Topical wound management initiated : 07/15/2020 Notes: Electronic Signature(s) Signed: 04/21/2022 5:35:48 PM By: [name] RN Entered By: [name] on 04/21/2022  11:39:37 -------------------------------------------------------------------------------- Pain Assessment Details Patient Name: Date of Service: [name], TO [name] 04/21/2022 9:30 A M Medical Record Number: [id number] Patient Account Number: [id number] Date of Birth/Sex: Treating RN: [birth year][name] (46 y.o. M) Primary Care Yazmin Locher: Dorinda Hill Other Clinician: Referring Onnika Siebel: Treating Callan Yontz/Extender: Jana Hakim in Treatment: 77 Active Problems Location of Pain Severity and Description of Pain Patient Has Paino Yes Site Locations Pain Location: Pain in Ulcers Rate the pain. Current Pain Level: 4 Pain Management and Medication Current Pain Management: Electronic Signature(s) Signed: 04/21/2022 4:42:06 PM By: Thayer Dallas Entered By: Thayer Dallas on 04/21/2022 09:41:20 -------------------------------------------------------------------------------- Patient/Caregiver Education Details Patient Name: Date of Service: [name], TO [place] 9/14/2023andnbsp9:30 A M Medical Record Number: [id number] Patient Account Number: [id number] Date of Birth/Gender: Treating RN: [name] [birth year], [name] (46 y.o. [name] Primary Care Physician: Dorinda Hill Other Clinician: Referring Physician: Treating Physician/Extender: Jana Hakim in Treatment: 53 Education Assessment Education Provided To: Patient  Education Topics Provided Wound/Skin Impairment: Methods: Explain/Verbal Responses: Return demonstration correctly Electronic Signature(s) Signed: 04/21/2022 5:35:48 PM By: [name] RN Entered By: [name] on 04/21/2022 11:39:49 -------------------------------------------------------------------------------- Wound Assessment Details Patient Name: Date of Service: [name], TO [name] 04/21/2022 9:30 A M Medical Record Number: [id number] Patient Account Number: [id number] Date of Birth/Sex: Treating RN: [name] (46 y.o.  M) Primary Care Khali Albanese: Dorinda Hill Other Clinician: Referring Kimya Mccahill: Treating Dhanya Bogle/Extender: Jana Hakim in Treatment: 77 Wound Status Wound Number: 2 Primary Etiology: Pressure Ulcer Wound Location: Left Calcaneus Wound Status: Open Wounding Event: Pressure Injury Comorbid History: Asthma, Angina, Hypertension Date Acquired: 10/07/2019 Weeks Of Treatment: 92 Clustered Wound: No Photos Wound Measurements Length: (cm) 0.2 Width: (cm) 0.3 Depth: (cm) 0.2 Area: (cm) 0.047 Volume: (cm) 0.009 % Reduction in Area: 99.8% % Reduction in Volume: 100% Epithelialization: Small (1-33%) Tunneling: No Undermining: No Wound Description Classification: Category/Stage III Wound Margin: Thickened Exudate Amount: Medium Exudate Type: Serosanguineous Exudate Color: red, brown Foul Odor After Cleansing: No Slough/Fibrino No Wound Bed Granulation Amount: Medium (34-66%) Exposed Structure Granulation Quality: Red Fascia Exposed: No Necrotic Amount: Medium (34-66%) Fat Layer (Subcutaneous Tissue) Exposed: No Necrotic Quality: Adherent Slough Tendon Exposed: No Muscle Exposed: No Joint Exposed: No Bone Exposed: No Treatment Notes Wound #2 (Calcaneus) Wound Laterality: Left Cleanser Normal Saline Discharge Instruction: Cleanse the wound with Normal Saline prior to applying a clean dressing using gauze sponges, not tissue or cotton balls. Wound Cleanser Discharge Instruction: Cleanse the wound with wound cleanser prior to applying a clean dressing using gauze sponges, not tissue or cotton balls. Peri-Wound Care Topical Primary Dressing Endoform 2x2 in Discharge Instruction: Moisten with saline Secondary Dressing Optifoam Non-Adhesive Dressing, 4x4 in Discharge Instruction: Apply over primary dressing as directed. Woven Gauze Sponge, Non-Sterile 4x4 in Discharge Instruction: Apply over primary dressing as directed. Secured With 68M Medipore H Soft  Cloth Surgical T ape, 4 x 10 (in/yd) Discharge Instruction: Secure with tape as directed. Compression Wrap Compression Stockings Add-Ons Electronic Signature(s) Signed: 04/21/2022 5:35:48 PM By: [name] RN Entered By: [name] on 04/21/2022 10:21:04 -------------------------------------------------------------------------------- Vitals Details Patient Name: Date of Service: [name], [name] 04/21/2022 9:30 A M Medical Record Number: [id number] Patient Account Number: [id number] Date of Birth/Sex: Treating RN: [birth year] (46 y.o. M) Primary Care Luda Charbonneau: Other Clinician: Dorinda Hill Referring Zofia Peckinpaugh: Treating Ajeet Casasola/Extender: Jana Hakim in Treatment: 67 Vital Signs Time Taken: 09:40 Temperature (F): 98.4 Height (in): 69 Pulse (bpm): 98 Weight (lbs): 280 Respiratory Rate (breaths/min): 18 Body Mass Index (BMI): 41.3 Blood Pressure (mmHg): 150/87 Reference Range: 80 - 120 mg / dl Electronic Signature(s) Signed: 04/21/2022 4:42:06 PM By: Thayer Dallas Entered By: Thayer Dallas on 04/21/2022 09:41:09

## 2022-04-21 NOTE — Progress Notes (Signed)
[name] ([id number]) Visit Report for 04/21/2022 Chief Complaint Document Details Patient Name: Date of Service: [name], [place] 04/21/2022 9:30 A M Medical Record Number: [id number] Patient Account Number: [id number] Date of Birth/Sex: Treating RN: [birth year] (46 y.o. M) Primary Care Provider: [name] Other Clinician: Referring Provider: Treating Provider/Extender: [name] in Treatment: 31 Information Obtained from: Patient Chief Complaint Bilateral Plantar Foot Ulcers Electronic Signature(s) Signed: 04/21/2022 10:40:13 AM By: Fredirick Maudlin MD FACS Entered By: Fredirick Maudlin on 04/21/2022 10:40:13 -------------------------------------------------------------------------------- Debridement Details Patient Name: Date of Service: [name], [place] 04/21/2022 9:30 A M Medical Record Number: [id number] Patient Account Number: [id number] Date of Birth/Sex: Treating RN: 09/04/1975 (46 y.o. [name] Primary Care Provider: [name] Other Clinician: Referring Provider: Treating Provider/Extender: [name] in Treatment: 92 Debridement Performed for Assessment: Wound #2 Left Calcaneus Performed By: Physician Fredirick Maudlin, MD Debridement Type: Debridement Level of Consciousness (Pre-procedure): Awake and Alert Pre-procedure Verification/Time Out Yes - 10:22 Taken: Start Time: 10:22 Pain Control: Lidocaine 4% T opical Solution T Area Debrided (L x W): otal 0.2 (cm) x 0.3 (cm) = 0.06 (cm) Tissue and other material debrided: Non-Viable, Slough, Slough Level: Non-Viable Tissue Debridement Description: Selective/Open Wound Instrument: Curette Bleeding: Minimum Hemostasis Achieved: Pressure End Time: 10:23 Procedural Pain: 0 Post Procedural Pain: 0 Response to Treatment: Procedure was tolerated well Level of Consciousness (Post- Awake and Alert procedure): Post Debridement Measurements of Total  Wound Length: (cm) 0.2 Stage: Category/Stage III Width: (cm) 0.3 Depth: (cm) 0.2 Volume: (cm) 0.009 Character of Wound/Ulcer Post Debridement: Improved Post Procedure Diagnosis Same as Pre-procedure Notes Scribed for Dr. [name] by J.Scotton Electronic Signature(s) Signed: 04/21/2022 10:44:26 AM By: Fredirick Maudlin MD FACS Signed: 04/21/2022 5:35:48 PM By: [name] Entered By: [name] on 04/21/2022 10:28:22 -------------------------------------------------------------------------------- HPI Details Patient Name: Date of Service: [name], [place]. 04/21/2022 9:30 A M Medical Record Number: [id number] Patient Account Number: [id number] Date of Birth/Sex: Treating RN: 06-01-1976 (46 y.o. M) Primary Care Provider: [name] Other Clinician: Referring Provider: Treating Provider/Extender: [name] in Treatment: 59 History of Present Illness HPI Description: Wounds are12/03/2020 upon evaluation today patient presents for initial inspection here in our clinic concerning issues he has been having with the bottoms of his feet bilaterally. He states these actually occurred as wounds when he was hospitalized for 5 months secondary to Covid. He was apparently with tilting bed where he was in an upright position quite frequently and apparently this occurred in some way shape or form during that time. Fortunately there is no sign of active infection at this time. No fevers, chills, nausea, vomiting, or diarrhea. With that being said he still has substantial wounds on the plantar aspects of his feet Theragen require quite a bit of work to get these to heal. He has been using Santyl currently though that is been problematic both in receiving the medication as well as actually paid for it as it is become quite expensive. Prior to the experience with Covid the patient really did not have any major medical problems other than hypertension he does have some  mild generalized weakness following the Covid experience. 07/22/2020 on evaluation today patient appears to be doing okay in regard to his foot ulcers I feel like the wound beds are showing signs of better improvement that I do believe the Iodoflex is helping in this regard. With that being said he does have a lot  of drainage currently and this is somewhat blue/green in nature which is consistent with Pseudomonas. I do think a culture today would be appropriate for [place] to evaluate and see if that is indeed the case I would likely start him on antibiotic orally as well he is not allergic to Cipro knows of no issues he has had in the past 12/21; patient was admitted to the clinic earlier this month with bilateral presumed pressure ulcers on the bottom of his feet apparently related to excessive pressure from a tilt table arrangement in the intensive care unit. Patient relates this to being on ECMO but I am not really sure that is exactly related to that. I must say I have never seen anything like this. He has fairly extensive full-thickness wounds extending from his heel towards his midfoot mostly centered laterally. There is already been some healing distally. He does not appear to have an arterial issue. He has been using gentamicin to the wound surfaces with Iodoflex to help with ongoing debridement 1/6; this is a patient with pressure ulcers on the bottom of his feet related to excessive pressure from a standing position in the intensive care unit. He is complaining of a lot of pain in the right heel. He is not a diabetic. 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 We have been using Iodoflex to help prepare the surfaces of both wounds for an advanced treatment product. He is nonambulatory spending most of his time in a wheelchair I have asked him not to propel the wheelchair with his heels 1/13; in general his wounds look better not much surface area change we have been using  Iodoflex as of last week. I did an x-ray of the right heel as the patient was complaining of pain. I had some thoughts about a stress fracture perhaps Achilles tendon problems however what it showed was erosive changes along the inferior aspect of the calcaneus he now has a MRI booked for 1/20. 1/20; in general his wounds continue to be better. Some improvement in the large narrow areas proximally in his foot. He is still complaining of pain in the right heel and tenderness in certain areas of this wound. His MRI is tonight. I am not just looking for osteomyelitis that was brought up on the x-ray I am wondering about stress fractures, tendon ruptures etc. He has no such findings on the left. Also noteworthy is that the patient had critical illness neuropathy and some of the discomfort may be actual improvement in nerve function I am just not sure. These wounds were initially in the setting of severe critical illness related to COVID-19. He was put in a standing position. He may have also been on pressors at the point contributing to tissue ischemia. By his description at some point these wounds were grossly necrotic extending proximally up into the Achilles part of his heel. I do not know that I have ever really seen pictures of them like this although they may exist in epic We have ordered Tri layer Oasis. I am trying to stimulate some granulation in these areas. This is of course assuming the MRI is negative for infection 1/27; since the patient was last here he saw Dr. Juleen China of infectious disease. He is planned for vancomycin and ceftriaxone. Prior operative culture grew MSSA. Also ordered baseline lab work. He also ordered arterial studies although the ABIs in our clinic were normal as well as his clinical exam these were normal I do  not think he needs to see vascular surgery. His ABIs at the [facility] were 1.22 in the right triphasic waveforms with a normal TBI of 1.15 on the left ABI of 1.22 with  triphasic waveforms and a normal TBI of 1.08. Finally he saw Dr. Amalia Hailey who will follow him in for 2 months. At this point I do not think he felt that he needed a procedure on the right calcaneal bone. Dr. Juleen China is elected for broad-spectrum antibiotic The patient is still having pain in the right heel. He walks with a walker 2/3; wounds are generally smaller. He is tolerating his IV antibiotics. I believe this is vancomycin and ceftriaxone. We are still waiting for Oasis burn in terms of his out-of-pocket max which he should be meeting soon given the IV antibiotics, MRIs etc. I have asked him to check in on this. We are using silver collagen in the meantime the wounds look better 2/10; tolerating IV vancomycin and Rocephin. We are waiting to apply for Oasis. Although I am not really sure where he is in his out-of-pocket max. 2/17 started the first application of Oasis trilayer. Still on antibiotics. The wounds have generally look better. The area on the left has a little more surface slough requiring debridement [id number]; second application of Oasis trilayer. The wound surface granulation is generally look better. The area on the left with undermining laterally I think is come in a bit. 10/08/2020 upon evaluation today patient is here today for [employer] application #3. Fortunately he seems to be doing extremely well with regard to this and we are seeing a lot of new epithelial growth which is great news. Fortunately there is no signs of active infection at this time. 10/16/2020 upon evaluation today patient appears to be doing well with regard to his foot ulcers. Do believe the Oasis has been of benefit for him. I do not see any signs of infection right now which is great news and I think that he has a lot of new epithelial growth which is great to see as well. The patient is very pleased to hear all of this. I do think we can proceed with the Oasis trilayer #4 today. 3/18; not as much improvement  in these areas on his heels that I was hoping. I did reapply trilateral Oasis today the tissue looks healthier but not as much fill in as I was hoping. 3/25; better looking today I think this is come in a bit the tissue looks healthier. Triple layer Oasis reapplied #6 4/1; somewhat better looking definitely better looking surface not as much change in surface area as I was hoping. He may be spending more time [place] on days then he needs to although he does have heel offloading boots. Triple layer Oasis reapplied #7 4/7; unfortunately apparently [facility] will not approve any further Oasis which is unfortunate since the patient did respond nicely both in terms of the condition of the wound bed as well as surface area. There is still some drainage coming from the wound but not a lot there does not appear to be any infection 4/15; we have been using Hydrofera Blue. He continues to have nice rims of epithelialization on the right greater than the left. The left the epithelialization is coming from the tip of his heel. There is moderate drainage. In this that concerns me about a total contact cast. There is no evidence of infection 4/29; patient has been using Hydrofera Blue with dressing changes. He  has no complaints or issues today. 5/5; using Hydrofera Blue. I actually think that he looks marginally better than the last time I saw this 3 weeks ago. There are rims of epithelialization on the left thumb coming from the medial side on the right. Using Hydrofera Blue 5/12; using Hydrofera Blue. These continue to make improvements in surface area. His drainage was not listed as severe I therefore went ahead and put a cast on the left foot. Right foot we will continue to dress his previous 5/16; back for first total contact cast change. He did not tolerate this particularly well cast injury on the anterior tibia among other issues. Difficulty sleeping. I talked him about this in some detail and  afterwards is elected to continue. I told him I would like to have a cast on for 3 weeks to see if this is going to help at all. I think he agreed 5/19; I think the wound is better. There is no tunneling towards his midfoot. The undermining medially also looks better. He has a rim of new skin distally. I think we are making progress here. The area on the left also continues to look somewhat better to me using Hydrofera Blue. He has a list of complaints about the cast but none of them seem serious 5/26; patient presents for 1 week follow-up. He has been using a total contact cast and tolerating this well. Hydrofera Blue is the main dressing used. He denies signs of infection. 6/2 Hydrofera Blue total contact cast on the left. These were large ulcers that formed in intensive care unit where the patient was recovering from [place]. May have had something to do with being ventilated in an upright positiono Pressors etc. We have been able to get the areas down considerably and a viable surface. There is some epithelialization in both sides. Note made of drainage 6/9; changed to [facility] last time because of drainage. He arrives with better looking surfaces and dimensions on the left than the right. Paradoxically the right actually probes more towards his midfoot the left is largely close down but both of these look improved. Using a total contact cast on the left 6/16; complex wounds on his bilateral plantar heels which were initially pressure injury from a stay in the ICU with COVID. We have been using silver alginate most recently. His dimensions of come in quite dramatically however not recently. We have been putting the left foot in a total contact cast 6/23; complex wounds on the bilateral plantar heels. I been putting the left in the cast paradoxically the area on the right is the one that is going towards closure at a faster rate. Quite a bit of drainage on the left. The patient went to see Dr.  Amalia Hailey who said he was going to standby for skin grafts. I had actually considered sending him for skin grafts however he would be mandatorily off his feet for a period of weeks to months. I am thinking that the area on the right is going to close on its own the area on the left has been more stubborn even though we have him in a total contact cast 6/30; took him out of a total contact cast last week is the right heel seem to be making better progress than the left where I was placing the cast. We are using silver alginate. Both wounds are smaller right greater than left 7/12; both wounds look as though they are making some progress. We are using  silver alginate. Heel offloading boots 7/26; very gradual progress especially on the right. Using silver alginate. He is wearing heel offloading boots 8/18; he continues to close these wounds down very gradually. Using silver alginate. The problem polymen being definitive about this is areas of what appears to be callus around the margins. This is not a 100% of the area but certainly sizable especially on the right 9/1; bilateral plantar feet wounds secondary to prolonged pressure while being ventilated for COVID-19 in an upright position. Essentially pressure ulcers on the bottom of his feet. He is made substantial progress using silver alginate. 9/14; bilateral plantar feet wounds secondary to prolonged pressure. Making progress using silver alginate. 9/29 bilateral plantar feet wounds secondary to prolonged pressure. I changed him to Iodoflex last week. MolecuLight showing reddened blush fluorescence 10/11; patient presents for follow-up. He has no issues or complaints today. He denies signs of infection. He continues to use Iodoflex and antibiotic ointment to the wound beds. 10/27; 2-week follow-up. No evidence of infection. He has callus and thick dry skin around the wound margins we have been using Iodoflex and Bactroban which was in response to a  moderate left MolecuLight reddish blush fluorescence. 11/10; 2-week follow-up. Wound margins again have thick callus however the measurements of the actual wound sites are a lot smaller. Everything looks reasonably healthy here. We have been using Iodoflex He was approved for prime matrix but I have elected to delay this given the improvement in the surface area. Hopefully I will not regret that decision as were getting close to the end of the year in terms of insurance payment 12/8; 2-week follow-up. Wounds are generally smaller in size. These were initially substantial wounds extending into the forefoot all the way into the heel on the bilateral plantar feet. They are now both located on the plantar heel distal aspect both of these have a lot of callus around the wounds I used a #5 curette to remove this on the right and the left also some subcutaneous debris to try and get the wound edges were using Iodoflex. He has heel offloading shoe 12/22; 2-week follow-up. Not really much improvement. He has thick callus around the outer edges of both wounds. I remove this there is some nonviable subcutaneous tissue as well. We have been using Iodoflex. Her intake nurse and myself spontaneously thought of a total contact cast I went back in May. At that time we really were not seeing much of an improvement with a cast although the wound was in a much different situation I would like to retry this in 2 weeks and I discussed this with the patient 08/12/2021; the patient has had some improvement with the Iodoflex. The the area on the left heel plantar more improved than the right. I had to put him in a total contact cast on the left although I decided to put that off for 2 weeks. I am going to change his primary dressing to silver collagen. I think in both areas he has had some improvement most of the healing seems to be more proximal in the heel. The wounds are in the mid aspect. A lot of thick callus on the right  heel however. 1/19; we are using silver collagen on both plantar heel areas. He has had some improvement today. The left did not require any debridement. He still had some eschar on the right that was debrided but both seem to have contracted. I did not put it total contact cast on  him today 2/2 we have been using silver collagen. The area on the right plantar heel has areas that appear to be epithelialized interspersed with dry flaking callus and dry skin. I removed this. This really looks better than on the other side. On the left still a large area with raised edges and debris on the surface. 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 He has no issues or complaints today. 2/9; not much change in the left foot wound with 1 week of a cast we are using silver collagen. Silver collagen on the right side. The right side has been the better wound surface. We will reapply the total contact cast on the left 2/16; not much improvement on either side I been using silver collagen with a total contact cast on the left. I'm changing the Hydrofera Blue still with a total contact cast on the left 2/23; some improvement on both sides. Disappointing that he has thick callus around the area that we are putting in a total contact cast on the left. We've been using Hydrofera Blue on both wound areas. This is a man who at essentially pressure ulcers in addition to ischemia caused by medications to support his blood pressure (pressors) in the ICU. He was being ventilated in the standing position for severe Covid. A [place] the wounds extended across his entire foot but are now localized to his plantar heels bilaterally. We have made progress however neither areas healed. I continue to think the total contact cast is helped albeit painstakingly slowly. He has never wanted a plastic surgery  consult although I don't know that they would be interested in grafting in area in this location. 10/07/2021: Continued improvement bilaterally. There is still some callus around the left wound, despite the total contact cast. He has some increased pain in his right midfoot around 1 particular area. This has been painful in the past but seems to be a little bit worse. When his cast was removed today, there was an area on the heel of the left foot that looks a bit macerated. He is also complaining of pain in his left thigh and hip which he thinks is secondary to the limb length discrepancy caused by the cast. 10/14/2021: He continues to improve. A little bit less callus around the left wound. He continues to endorse pain in his right midfoot, but this is not as significant as it was last week. The maceration on his left heel is improved. 10/21/2021: Continued improvement to both wounds. The maceration on his left heel is no longer evident. Less callus bilaterally. Epithelialization progressing. 10/28/2021: Significant improvement this week. The right sided wound is nearly closed with just a small open area at the middle. No maceration seen on the left heel. Continued epithelialization on both sides. No concern for infection. 11/04/2021: T oday, the wounds were measured a little bit differently and come out as larger, but I actually think they are about the same to potentially even smaller, particularly on the left. He continues to accumulate some callus on the right. 11/11/2021: T oday, the patient is expressing some concern that the left wound, despite being in the total contact cast, is not progressing at the same rate as the 1 on the right. He is interested in trying a week without the cast to see how the wound does. The wounds are roughly  the same size as last week, with the right perhaps being a little bit smaller. He continues to build up callus on both sites. 11/18/2021: Last week, I permitted the patient  to go without his total contact cast, just to see if the cast was really making any difference. Today, both wounds have deteriorated to some extent, suggesting that the cast is providing benefit, at least on the left. Both are larger and have accumulated callus, slough, and other debris. 11/26/2021: I debrided both wounds quite aggressively last week in an effort to stimulate the healing cascade. This appears to have been effective as the left sided wound is a full centimeter shorter in length. Although the right was measured slightly larger, on inspection, it looks as though an area of epithelialized tissue was included in the measurements. We have been using PolyMem Ag on the wound surfaces with a total contact cast to the left. 12/02/2021: It appears that the intake personnel are including epithelialized tissue in his wound measurements; the right wound is almost completely epithelialized; there is just a crater at the proximal midfoot with some open areas. On the left, he has built up some callus, but the overall wound surface looks good. There is some senescent skin around the wound margin. He has been in PolyMem Ag bilaterally with a total contact cast on the left. 12/09/2021: The right wound is nearly closed; there is just a small open area at the mid calcaneus. On the left, the wound is smaller with minimal callus buildup. No significant drainage. 12/16/2021: The right calcaneal wound remains minimally open at the mid calcaneus; the rest has epithelialized. On the left, the wound is also a little bit smaller. There is some senescent tissue on the periphery. He is getting his first application of a trial skin substitute called Vendaje today. 12/23/2021: The wound on his right calcaneus is nearly closed; there is just a small area at the most distal aspect of the calcaneus that is open. On the left, the area where we applied to the skin substitute has a healthier-looking bed of granulation tissue. The  wound dimensions are not significantly different on this side but the wound surface is improved. 12/30/2021: The wound on the right calcaneus has not changed significantly aside from some accumulation of callus. On the left, the open area is smaller and continues to have an improved surface. He continues to accumulate callus around the wound. He is here for his third application of Vendaje. 01/06/2022: The right calcaneal wound is down to just a couple of millimeters. He continues to accumulate periwound callus. He unfortunately got his cast wet earlier in the week and his left foot is macerated, resulting in some superficial skin loss just distal to the open wound. The open wound itself, however, is much smaller and has a healthier appearing surface. He is here for his fourth application of Vendaje. 01/13/2022: The right calcaneal wound is about the same. Unfortunately, once again, his cast got wet and his foot is again macerated. This is caused the left calcaneal wound to enlarge. He is here for his fifth application of Vendaje. 01/20/2022: The right calcaneal wound is very small. There is some periwound callus accumulation. He purchased a new cast protector last week and this has been effective in avoiding the maceration that has been occurring on the left. The left calcaneal wound is narrower and has a healthy and viable-appearing surface. He is here for his 6 application of Vendaje. 01/27/2022: The right calcaneal wound  is down to just a pinhole. There is some periwound slough and callus. On the left, the wound is narrower and shorter by about a centimeter. The surface is robust and viable-appearing. Unfortunately, the rep for the trial skin substitute product did not provide any for [place] to use today. 02/04/2022: The right calcaneal wound remains unchanged. There is more accumulated callus. On the left, although the intake nurse measured it a little bit longer, it looks about the same to me. The surface  has a layer of slough, but underneath this, there is good granulation tissue. 02/10/2022: The right calcaneus wound is nearly closed. There is still some callus that builds up around the site. The left side looks about the same in terms of dimensions, but the surface is more robust and vital-appearing. 02/16/2022: The area of the right calcaneus that was nearly closed last week has closed, but there is a small opening at the mid foot where it looks like some moisture got retained and caused some reopening. The left foot wound is narrower and shallower. Both sites have a fair amount of periwound callus and eschar. 02/24/2022: The small midfoot opening on the right calcaneus is a little bit smaller today. The left foot wound is narrower and shallower. He continues to accumulate periwound callus. No concern for infection. 03/01/2022: The patient came to clinic early because he showered and got his cast wet. Fortunately, there is no significant maceration to his foot but the callus softened and it looks like the wound on his left calcaneus may be a little bit wider. The wound on his right calcaneus is just a narrow slit. Continued accumulation of periwound callus bilaterally. 03/08/2022: The wound on his right calcaneus is very nearly closed, just a small pinpoint opening under a bit of eschar; the left wound has come in quite a bit, as well. It is narrower and shorter than at our last visit. Still with accumulated callus and eschar bilaterally. 03/17/2022: The right calcaneal wound is healed. The left wound is smaller and the surface itself is very clean, but there is some blue-green staining on the periwound callus, concerning for Pseudomonas aeruginosa. 03/23/2022: The right calcaneal wound remains closed. The left wound continues to contract. No further blue-green staining. Small amount of callus and slough accumulation. 03/28/2022: He came in early today because he had gotten his cast wet. On inspection, the  wound itself did not get wet or macerated, just a little bit of the forefoot. The wound itself is basically unchanged. 04/07/2022: The right foot wound remains closed. The left wound is the smallest that I have seen it to date. It is narrower and shorter. It still continues to accumulate slough on the surface. 04/15/2022: There is a band of epithelium now dividing the small left plantar foot wound in 2. There is still some slough on the surface. 04/21/2022: The wound continues to narrow. Just a little bit of slough on the surface. He seems to be responding well to endoform. Electronic Signature(s) Signed: 04/21/2022 10:41:31 AM By: Fredirick Maudlin MD FACS Entered By: Fredirick Maudlin on 04/21/2022 10:41:31 -------------------------------------------------------------------------------- Physical Exam Details Patient Name: Date of Service: [name], [place] 04/21/2022 9:30 A M Medical Record Number: [id number] Patient Account Number: [id number] Date of Birth/Sex: Treating RN: May 27, 1976 (46 y.o. M) Primary Care Provider: [name] Other Clinician: Referring Provider: Treating Provider/Extender: [name] in Treatment: 46 Constitutional Hypertensive, asymptomatic. . . . No acute distress.Marland Kitchen Respiratory Normal work of breathing on room  air.. Notes 04/21/2022: The wound continues to narrow. Just a little bit of slough on the surface. Electronic Signature(s) Signed: 04/21/2022 10:42:04 AM By: [name] MD FACS Entered By: [name] on 04/21/2022 10:42:03 -------------------------------------------------------------------------------- Physician Orders Details Patient Name: Date of Service: [name], [name] 04/21/2022 9:30 A M Medical Record Number: [id number] Patient Account Number: [id number] Date of Birth/Sex: Treating RN: 01/02/1976 (46 y.o. [name] Primary Care Provider: Dorinda Hill Other Clinician: Referring Provider: Treating  Provider/Extender: Jana Hakim in Treatment: 66 Verbal / Phone Orders: No Diagnosis Coding ICD-10 Coding Code Description [phone] Non-pressure chronic ulcer of other part of left foot with fat layer exposed Follow-up Appointments ppointment in 1 week. - Dr Lady Gary Room 3 Thursday 04/28/22 at 9:30am Return A Anesthetic Wound #2 Left Calcaneus (In clinic) Topical Lidocaine 4% applied to wound bed - In clinic Bathing/ Shower/ Hygiene May shower with protection but do not get wound dressing(s) wet. - Cover with cast protector (can purchase cast protector at [facility] or [facility] ) Edema Control - Lymphedema / SCD / Other Bilateral Lower Extremities Elevate legs to the level of the heart or above for 30 minutes daily and/or when sitting, a frequency of: - throughout the day Avoid standing for long periods of time. Moisturize legs daily. - right leg and foot every night. Off-Loading Total Contact Cast to Left Lower Extremity - TCC to Left LL Other: - keep pressure off of the bottom of your feet Additional Orders / Instructions Follow Nutritious Diet Wound Treatment Wound #2 - Calcaneus Wound Laterality: Left Cleanser: Normal Saline (Generic) 1 x Per Week/30 Days Discharge Instructions: Cleanse the wound with Normal Saline prior to applying a clean dressing using gauze sponges, not tissue or cotton balls. Cleanser: Wound Cleanser 1 x Per Week/30 Days Discharge Instructions: Cleanse the wound with wound cleanser prior to applying a clean dressing using gauze sponges, not tissue or cotton balls. Prim Dressing: Endoform 2x2 in 1 x Per Week/30 Days ary Discharge Instructions: Moisten with saline Secondary Dressing: Optifoam Non-Adhesive Dressing, 4x4 in (Generic) 1 x Per Week/30 Days Discharge Instructions: Apply over primary dressing as directed. Secondary Dressing: Woven Gauze Sponge, Non-Sterile 4x4 in 1 x Per Week/30 Days Discharge Instructions: Apply over primary  dressing as directed. Secured With: 70M Medipore H Soft Cloth Surgical T ape, 4 x 10 (in/yd) (Generic) 1 x Per Week/30 Days Discharge Instructions: Secure with tape as directed. Electronic Signature(s) Signed: 04/21/2022 10:44:26 AM By: [name] MD FACS Entered By: [name] on 04/21/2022 10:42:18 -------------------------------------------------------------------------------- Problem List Details Patient Name: Date of Service: [name], [name] 04/21/2022 9:30 A M Medical Record Number: [id number] Patient Account Number: [id number] Date of Birth/Sex: Treating RN: Jan 02, 1976 (46 y.o. M) Primary Care Provider: Dorinda Hill Other Clinician: Referring Provider: Treating Provider/Extender: Jana Hakim in Treatment: 69 Active Problems ICD-10 Encounter Code Description Active Date MDM Diagnosis L97.522 Non-pressure chronic ulcer of other part of left foot with fat layer exposed 09/03/2020 No Yes Inactive Problems ICD-10 Code Description Active Date Inactive Date L97.512 Non-pressure chronic ulcer of other part of right foot with fat layer exposed 09/03/2020 09/03/2020 L89.893 Pressure ulcer of other site, stage 3 07/15/2020 07/15/2020 M62.81 Muscle weakness (generalized) 07/15/2020 07/15/2020 I10 Essential (primary) hypertension 07/15/2020 07/15/2020 M86.171 Other acute osteomyelitis, right ankle and foot 09/03/2020 09/03/2020 Resolved Problems Electronic Signature(s) Signed: 04/21/2022 10:40:02 AM By: [name] MD FACS Entered By: [name] on 04/21/2022 10:40:01 -------------------------------------------------------------------------------- Progress Note Details Patient Name:  Date of Service: [name], [place] 04/21/2022 9:30 A M Medical Record Number: [id number] Patient Account Number: [id number] Date of Birth/Sex: Treating RN: 30-Sep-1975 (46 y.o. M) Primary Care Provider: [name] Other Clinician: Referring Provider: Treating  Provider/Extender: [name] in Treatment: 18 Subjective Chief Complaint Information obtained from Patient Bilateral Plantar Foot Ulcers History of Present Illness (HPI) Wounds are12/03/2020 upon evaluation today patient presents for initial inspection here in our clinic concerning issues he has been having with the bottoms of his feet bilaterally. He states these actually occurred as wounds when he was hospitalized for 5 months secondary to Covid. He was apparently with tilting bed where he was in an upright position quite frequently and apparently this occurred in some way shape or form during that time. Fortunately there is no sign of active infection at this time. No fevers, chills, nausea, vomiting, or diarrhea. With that being said he still has substantial wounds on the plantar aspects of his feet Theragen require quite a bit of work to get these to heal. He has been using Santyl currently though that is been problematic both in receiving the medication as well as actually paid for it as it is become quite expensive. Prior to the experience with Covid the patient really did not have any major medical problems other than hypertension he does have some mild generalized weakness following the Covid experience. 07/22/2020 on evaluation today patient appears to be doing okay in regard to his foot ulcers I feel like the wound beds are showing signs of better improvement that I do believe the Iodoflex is helping in this regard. With that being said he does have a lot of drainage currently and this is somewhat blue/green in nature which is consistent with Pseudomonas. I do think a culture today would be appropriate for [place] to evaluate and see if that is indeed the case I would likely start him on antibiotic orally as well he is not allergic to Cipro knows of no issues he has had in the past 12/21; patient was admitted to the clinic earlier this month with bilateral presumed  pressure ulcers on the bottom of his feet apparently related to excessive pressure from a tilt table arrangement in the intensive care unit. Patient relates this to being on ECMO but I am not really sure that is exactly related to that. I must say I have never seen anything like this. He has fairly extensive full-thickness wounds extending from his heel towards his midfoot mostly centered laterally. There is already been some healing distally. He does not appear to have an arterial issue. He has been using gentamicin to the wound surfaces with Iodoflex to help with ongoing debridement 1/6; this is a patient with pressure ulcers on the bottom of his feet related to excessive pressure from a standing position in the intensive care unit. He is complaining of a lot of pain in the right heel. He is not a diabetic. 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 We have been using Iodoflex to help prepare the surfaces of both wounds for an advanced treatment product. He is nonambulatory spending most of his time in a wheelchair I have asked him not to propel the wheelchair with his heels 1/13; in general his wounds look better not much surface area change we have been using Iodoflex as of last week. I did an x-ray of the right heel as the patient was complaining of pain. I had some  thoughts about a stress fracture perhaps Achilles tendon problems however what it showed was erosive changes along the inferior aspect of the calcaneus he now has a MRI booked for 1/20. 1/20; in general his wounds continue to be better. Some improvement in the large narrow areas proximally in his foot. He is still complaining of pain in the right heel and tenderness in certain areas of this wound. His MRI is tonight. I am not just looking for osteomyelitis that was brought up on the x-ray I am wondering about stress fractures, tendon ruptures etc. He has no such findings on the left. Also noteworthy is that the  patient had critical illness neuropathy and some of the discomfort may be actual improvement in nerve function I am just not sure. These wounds were initially in the setting of severe critical illness related to COVID-19. He was put in a standing position. He may have also been on pressors at the point contributing to tissue ischemia. By his description at some point these wounds were grossly necrotic extending proximally up into the Achilles part of his heel. I do not know that I have ever really seen pictures of them like this although they may exist in epic We have ordered Tri layer Oasis. I am trying to stimulate some granulation in these areas. This is of course assuming the MRI is negative for infection 1/27; since the patient was last here he saw Dr. Juleen China of infectious disease. He is planned for vancomycin and ceftriaxone. Prior operative culture grew MSSA. Also ordered baseline lab work. He also ordered arterial studies although the ABIs in our clinic were normal as well as his clinical exam these were normal I do not think he needs to see vascular surgery. His ABIs at the [facility] were 1.22 in the right triphasic waveforms with a normal TBI of 1.15 on the left ABI of 1.22 with triphasic waveforms and a normal TBI of 1.08. Finally he saw Dr. Amalia Hailey who will follow him in for 2 months. At this point I do not think he felt that he needed a procedure on the right calcaneal bone. Dr. Juleen China is elected for broad-spectrum antibiotic The patient is still having pain in the right heel. He walks with a walker 2/3; wounds are generally smaller. He is tolerating his IV antibiotics. I believe this is vancomycin and ceftriaxone. We are still waiting for Oasis burn in terms of his out-of-pocket max which he should be meeting soon given the IV antibiotics, MRIs etc. I have asked him to check in on this. We are using silver collagen in the meantime the wounds look better 2/10; tolerating IV vancomycin and  Rocephin. We are waiting to apply for Oasis. Although I am not really sure where he is in his out-of-pocket max. 2/17 started the first application of Oasis trilayer. Still on antibiotics. The wounds have generally look better. The area on the left has a little more surface slough requiring debridement [id number]; second application of Oasis trilayer. The wound surface granulation is generally look better. The area on the left with undermining laterally I think is come in a bit. 10/08/2020 upon evaluation today patient is here today for [employer] application #3. Fortunately he seems to be doing extremely well with regard to this and we are seeing a lot of new epithelial growth which is great news. Fortunately there is no signs of active infection at this time. 10/16/2020 upon evaluation today patient appears to be doing well with regard to his  foot ulcers. Do believe the Oasis has been of benefit for him. I do not see any signs of infection right now which is great news and I think that he has a lot of new epithelial growth which is great to see as well. The patient is very pleased to hear all of this. I do think we can proceed with the Oasis trilayer #4 today. 3/18; not as much improvement in these areas on his heels that I was hoping. I did reapply trilateral Oasis today the tissue looks healthier but not as much fill in as I was hoping. 3/25; better looking today I think this is come in a bit the tissue looks healthier. Triple layer Oasis reapplied #6 4/1; somewhat better looking definitely better looking surface not as much change in surface area as I was hoping. He may be spending more time [place] on days then he needs to although he does have heel offloading boots. Triple layer Oasis reapplied #7 4/7; unfortunately apparently [facility] will not approve any further Oasis which is unfortunate since the patient did respond nicely both in terms of the condition of the wound bed as well as  surface area. There is still some drainage coming from the wound but not a lot there does not appear to be any infection 4/15; we have been using Hydrofera Blue. He continues to have nice rims of epithelialization on the right greater than the left. The left the epithelialization is coming from the tip of his heel. There is moderate drainage. In this that concerns me about a total contact cast. There is no evidence of infection 4/29; patient has been using Hydrofera Blue with dressing changes. He has no complaints or issues today. 5/5; using Hydrofera Blue. I actually think that he looks marginally better than the last time I saw this 3 weeks ago. There are rims of epithelialization on the left thumb coming from the medial side on the right. Using Hydrofera Blue 5/12; using Hydrofera Blue. These continue to make improvements in surface area. His drainage was not listed as severe I therefore went ahead and put a cast on the left foot. Right foot we will continue to dress his previous 5/16; back for first total contact cast change. He did not tolerate this particularly well cast injury on the anterior tibia among other issues. Difficulty sleeping. I talked him about this in some detail and afterwards is elected to continue. I told him I would like to have a cast on for 3 weeks to see if this is going to help at all. I think he agreed 5/19; I think the wound is better. There is no tunneling towards his midfoot. The undermining medially also looks better. He has a rim of new skin distally. I think we are making progress here. The area on the left also continues to look somewhat better to me using Hydrofera Blue. He has a list of complaints about the cast but none of them seem serious 5/26; patient presents for 1 week follow-up. He has been using a total contact cast and tolerating this well. Hydrofera Blue is the main dressing used. He denies signs of infection. 6/2 Hydrofera Blue total contact cast on  the left. These were large ulcers that formed in intensive care unit where the patient was recovering from [place]. May have had something to do with being ventilated in an upright positiono Pressors etc. We have been able to get the areas down considerably and a viable surface.  There is some epithelialization in both sides. Note made of drainage 6/9; changed to [facility] last time because of drainage. He arrives with better looking surfaces and dimensions on the left than the right. Paradoxically the right actually probes more towards his midfoot the left is largely close down but both of these look improved. Using a total contact cast on the left 6/16; complex wounds on his bilateral plantar heels which were initially pressure injury from a stay in the ICU with COVID. We have been using silver alginate most recently. His dimensions of come in quite dramatically however not recently. We have been putting the left foot in a total contact cast 6/23; complex wounds on the bilateral plantar heels. I been putting the left in the cast paradoxically the area on the right is the one that is going towards closure at a faster rate. Quite a bit of drainage on the left. The patient went to see Dr. Amalia Hailey who said he was going to standby for skin grafts. I had actually considered sending him for skin grafts however he would be mandatorily off his feet for a period of weeks to months. I am thinking that the area on the right is going to close on its own the area on the left has been more stubborn even though we have him in a total contact cast 6/30; took him out of a total contact cast last week is the right heel seem to be making better progress than the left where I was placing the cast. We are using silver alginate. Both wounds are smaller right greater than left 7/12; both wounds look as though they are making some progress. We are using silver alginate. Heel offloading boots 7/26; very gradual progress  especially on the right. Using silver alginate. He is wearing heel offloading boots 8/18; he continues to close these wounds down very gradually. Using silver alginate. The problem polymen being definitive about this is areas of what appears to be callus around the margins. This is not a 100% of the area but certainly sizable especially on the right 9/1; bilateral plantar feet wounds secondary to prolonged pressure while being ventilated for COVID-19 in an upright position. Essentially pressure ulcers on the bottom of his feet. He is made substantial progress using silver alginate. 9/14; bilateral plantar feet wounds secondary to prolonged pressure. Making progress using silver alginate. 9/29 bilateral plantar feet wounds secondary to prolonged pressure. I changed him to Iodoflex last week. MolecuLight showing reddened blush fluorescence 10/11; patient presents for follow-up. He has no issues or complaints today. He denies signs of infection. He continues to use Iodoflex and antibiotic ointment to the wound beds. 10/27; 2-week follow-up. No evidence of infection. He has callus and thick dry skin around the wound margins we have been using Iodoflex and Bactroban which was in response to a moderate left MolecuLight reddish blush fluorescence. 11/10; 2-week follow-up. Wound margins again have thick callus however the measurements of the actual wound sites are a lot smaller. Everything looks reasonably healthy here. We have been using Iodoflex He was approved for prime matrix but I have elected to delay this given the improvement in the surface area. Hopefully I will not regret that decision as were getting close to the end of the year in terms of insurance payment 12/8; 2-week follow-up. Wounds are generally smaller in size. These were initially substantial wounds extending into the forefoot all the way into the heel on the bilateral plantar feet. They are now  both located on the plantar heel distal  aspect both of these have a lot of callus around the wounds I used a #5 curette to remove this on the right and the left also some subcutaneous debris to try and get the wound edges were using Iodoflex. He has heel offloading shoe 12/22; 2-week follow-up. Not really much improvement. He has thick callus around the outer edges of both wounds. I remove this there is some nonviable subcutaneous tissue as well. We have been using Iodoflex. Her intake nurse and myself spontaneously thought of a total contact cast I went back in May. At that time we really were not seeing much of an improvement with a cast although the wound was in a much different situation I would like to retry this in 2 weeks and I discussed this with the patient 08/12/2021; the patient has had some improvement with the Iodoflex. The the area on the left heel plantar more improved than the right. I had to put him in a total contact cast on the left although I decided to put that off for 2 weeks. I am going to change his primary dressing to silver collagen. I think in both areas he has had some improvement most of the healing seems to be more proximal in the heel. The wounds are in the mid aspect. A lot of thick callus on the right heel however. 1/19; we are using silver collagen on both plantar heel areas. He has had some improvement today. The left did not require any debridement. He still had some eschar on the right that was debrided but both seem to have contracted. I did not put it total contact cast on him today 2/2 we have been using silver collagen. The area on the right plantar heel has areas that appear to be epithelialized interspersed with dry flaking callus and dry skin. I removed this. This really looks better than on the other side. On the left still a large area with raised edges and debris on the surface. 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 He has no issues or complaints today. 2/9; not much change in the left foot wound with 1 week of a cast we are using silver collagen. Silver collagen on the right side. The right side has been the better wound surface. We will reapply the total contact cast on the left 2/16; not much improvement on either side I been using silver collagen with a total contact cast on the left. I'm changing the Hydrofera Blue still with a total contact cast on the left 2/23; some improvement on both sides. Disappointing that he has thick callus around the area that we are putting in a total contact cast on the left. We've been using Hydrofera Blue on both wound areas. This is a man who at essentially pressure ulcers in addition to ischemia caused by medications to support his blood pressure (pressors) in the ICU. He was being ventilated in the standing position for severe Covid. A Shiley the wounds extended across his entire foot but are now localized to his plantar heels bilaterally. We have made progress however neither areas healed. I continue to think the total contact cast is helped albeit painstakingly slowly. He has never wanted a plastic surgery consult although I don't know that they would be interested in grafting in area in this  location. 10/07/2021: Continued improvement bilaterally. There is still some callus around the left wound, despite the total contact cast. He has some increased pain in his right midfoot around 1 particular area. This has been painful in the past but seems to be a little bit worse. When his cast was removed today, there was an area on the heel of the left foot that looks a bit macerated. He is also complaining of pain in his left thigh and hip which he thinks is secondary to the limb length discrepancy caused by the cast. 10/14/2021: He continues to improve. A little bit less callus around the left wound. He continues to endorse  pain in his right midfoot, but this is not as significant as it was last week. The maceration on his left heel is improved. 10/21/2021: Continued improvement to both wounds. The maceration on his left heel is no longer evident. Less callus bilaterally. Epithelialization progressing. 10/28/2021: Significant improvement this week. The right sided wound is nearly closed with just a small open area at the middle. No maceration seen on the left heel. Continued epithelialization on both sides. No concern for infection. 11/04/2021: T oday, the wounds were measured a little bit differently and come out as larger, but I actually think they are about the same to potentially even smaller, particularly on the left. He continues to accumulate some callus on the right. 11/11/2021: T oday, the patient is expressing some concern that the left wound, despite being in the total contact cast, is not progressing at the same rate as the 1 on the right. He is interested in trying a week without the cast to see how the wound does. The wounds are roughly the same size as last week, with the right perhaps being a little bit smaller. He continues to build up callus on both sites. 11/18/2021: Last week, I permitted the patient to go without his total contact cast, just to see if the cast was really making any difference. Today, both wounds have deteriorated to some extent, suggesting that the cast is providing benefit, at least on the left. Both are larger and have accumulated callus, slough, and other debris. 11/26/2021: I debrided both wounds quite aggressively last week in an effort to stimulate the healing cascade. This appears to have been effective as the left sided wound is a full centimeter shorter in length. Although the right was measured slightly larger, on inspection, it looks as though an area of epithelialized tissue was included in the measurements. We have been using PolyMem Ag on the wound surfaces with a total  contact cast to the left. 12/02/2021: It appears that the intake personnel are including epithelialized tissue in his wound measurements; the right wound is almost completely epithelialized; there is just a crater at the proximal midfoot with some open areas. On the left, he has built up some callus, but the overall wound surface looks good. There is some senescent skin around the wound margin. He has been in PolyMem Ag bilaterally with a total contact cast on the left. 12/09/2021: The right wound is nearly closed; there is just a small open area at the mid calcaneus. On the left, the wound is smaller with minimal callus buildup. No significant drainage. 12/16/2021: The right calcaneal wound remains minimally open at the mid calcaneus; the rest has epithelialized. On the left, the wound is also a little bit smaller. There is some senescent tissue on the periphery. He is getting his first application of a trial skin  substitute called Vendaje today. 12/23/2021: The wound on his right calcaneus is nearly closed; there is just a small area at the most distal aspect of the calcaneus that is open. On the left, the area where we applied to the skin substitute has a healthier-looking bed of granulation tissue. The wound dimensions are not significantly different on this side but the wound surface is improved. 12/30/2021: The wound on the right calcaneus has not changed significantly aside from some accumulation of callus. On the left, the open area is smaller and continues to have an improved surface. He continues to accumulate callus around the wound. He is here for his third application of Vendaje. 01/06/2022: The right calcaneal wound is down to just a couple of millimeters. He continues to accumulate periwound callus. He unfortunately got his cast wet earlier in the week and his left foot is macerated, resulting in some superficial skin loss just distal to the open wound. The open wound itself, however, is  much smaller and has a healthier appearing surface. He is here for his fourth application of Vendaje. 01/13/2022: The right calcaneal wound is about the same. Unfortunately, once again, his cast got wet and his foot is again macerated. This is caused the left calcaneal wound to enlarge. He is here for his fifth application of Vendaje. 01/20/2022: The right calcaneal wound is very small. There is some periwound callus accumulation. He purchased a new cast protector last week and this has been effective in avoiding the maceration that has been occurring on the left. The left calcaneal wound is narrower and has a healthy and viable-appearing surface. He is here for his 6 application of Vendaje. 01/27/2022: The right calcaneal wound is down to just a pinhole. There is some periwound slough and callus. On the left, the wound is narrower and shorter by about a centimeter. The surface is robust and viable-appearing. Unfortunately, the rep for the trial skin substitute product did not provide any for [place] to use today. 02/04/2022: The right calcaneal wound remains unchanged. There is more accumulated callus. On the left, although the intake nurse measured it a little bit longer, it looks about the same to me. The surface has a layer of slough, but underneath this, there is good granulation tissue. 02/10/2022: The right calcaneus wound is nearly closed. There is still some callus that builds up around the site. The left side looks about the same in terms of dimensions, but the surface is more robust and vital-appearing. 02/16/2022: The area of the right calcaneus that was nearly closed last week has closed, but there is a small opening at the mid foot where it looks like some moisture got retained and caused some reopening. The left foot wound is narrower and shallower. Both sites have a fair amount of periwound callus and eschar. 02/24/2022: The small midfoot opening on the right calcaneus is a little bit smaller  today. The left foot wound is narrower and shallower. He continues to accumulate periwound callus. No concern for infection. 03/01/2022: The patient came to clinic early because he showered and got his cast wet. Fortunately, there is no significant maceration to his foot but the callus softened and it looks like the wound on his left calcaneus may be a little bit wider. The wound on his right calcaneus is just a narrow slit. Continued accumulation of periwound callus bilaterally. 03/08/2022: The wound on his right calcaneus is very nearly closed, just a small pinpoint opening under a bit of eschar; the left  wound has come in quite a bit, as well. It is narrower and shorter than at our last visit. Still with accumulated callus and eschar bilaterally. 03/17/2022: The right calcaneal wound is healed. The left wound is smaller and the surface itself is very clean, but there is some blue-green staining on the periwound callus, concerning for Pseudomonas aeruginosa. 03/23/2022: The right calcaneal wound remains closed. The left wound continues to contract. No further blue-green staining. Small amount of callus and slough accumulation. 03/28/2022: He came in early today because he had gotten his cast wet. On inspection, the wound itself did not get wet or macerated, just a little bit of the forefoot. The wound itself is basically unchanged. 04/07/2022: The right foot wound remains closed. The left wound is the smallest that I have seen it to date. It is narrower and shorter. It still continues to accumulate slough on the surface. 04/15/2022: There is a band of epithelium now dividing the small left plantar foot wound in 2. There is still some slough on the surface. 04/21/2022: The wound continues to narrow. Just a little bit of slough on the surface. He seems to be responding well to endoform. Patient History Information obtained from Patient. Family History Cancer - Maternal Grandparents, Diabetes -  Father,Paternal Grandparents, Heart Disease - Maternal Grandparents, Hypertension - Father,Paternal Grandparents, Lung Disease - Siblings, No family history of Hereditary Spherocytosis, Kidney Disease, Seizures, Stroke, Thyroid Problems, Tuberculosis. Social History Never smoker, Marital Status - Married, Alcohol Use - Never, Drug Use - No History, Caffeine Use - Daily - tea, soda. Medical History Eyes Denies history of Cataracts, Glaucoma, Optic Neuritis Ear/Nose/Mouth/Throat Denies history of Chronic sinus problems/congestion, Middle ear problems Hematologic/Lymphatic Denies history of Anemia, Hemophilia, Human Immunodeficiency Virus, Lymphedema, Sickle Cell Disease Respiratory Patient has history of Asthma Denies history of Aspiration, Chronic Obstructive Pulmonary Disease (COPD), Pneumothorax, Sleep Apnea, Tuberculosis Cardiovascular Patient has history of Angina - with COVID, Hypertension Denies history of Arrhythmia, Congestive Heart Failure, Coronary Artery Disease, Deep Vein Thrombosis, Hypotension, Myocardial Infarction, Peripheral Arterial Disease, Peripheral Venous Disease, Phlebitis, Vasculitis Gastrointestinal Denies history of Cirrhosis , Colitis, Crohnoos, Hepatitis A, Hepatitis B, Hepatitis C Endocrine Denies history of Type I Diabetes, Type II Diabetes Genitourinary Denies history of End Stage Renal Disease Immunological Denies history of Lupus Erythematosus, Raynaudoos, Scleroderma Integumentary (Skin) Denies history of History of Burn Musculoskeletal Denies history of Gout, Rheumatoid Arthritis, Osteoarthritis, Osteomyelitis Neurologic Denies history of Dementia, Neuropathy, Quadriplegia, Paraplegia, Seizure Disorder Oncologic Denies history of Received Chemotherapy, Received Radiation Psychiatric Denies history of Anorexia/bulimia, Confinement Anxiety Hospitalization/Surgery History - COVID PNA 07/22/2019- 11/14/2019. - 03/27/2020 wound debridement/ skin  graft. Medical A Surgical History Notes nd Constitutional Symptoms (General Health) COVID PNA 07/22/2019-11/14/2019 [facility] ECMO, foot drop left foot , Genitourinary kidney stone Psychiatric anxiety Objective Constitutional Hypertensive, asymptomatic. No acute distress.. Vitals Time Taken: 9:40 AM, Height: 69 in, Weight: 280 lbs, BMI: 41.3, Temperature: 98.4 F, Pulse: 98 bpm, Respiratory Rate: 18 breaths/min, Blood Pressure: 150/87 mmHg. Respiratory Normal work of breathing on room air.. General Notes: 04/21/2022: The wound continues to narrow. Just a little bit of slough on the surface. Integumentary (Hair, Skin) Wound #2 status is Open. Original cause of wound was Pressure Injury. The date acquired was: 10/07/2019. The wound has been in treatment 92 weeks. The wound is located on the Left Calcaneus. The wound measures 0.2cm length x 0.3cm width x 0.2cm depth; 0.047cm^2 area and 0.009cm^3 volume. There is no tunneling or undermining noted. There is a medium amount of  serosanguineous drainage noted. The wound margin is thickened. There is medium (34-66%) red granulation within the wound bed. There is a medium (34-66%) amount of necrotic tissue within the wound bed including Adherent Slough. Assessment Active Problems ICD-10 Non-pressure chronic ulcer of other part of left foot with fat layer exposed Procedures Wound #2 Pre-procedure diagnosis of Wound #2 is a Pressure Ulcer located on the Left Calcaneus . There was a Selective/Open Wound Non-Viable Tissue Debridement with a total area of 0.06 sq cm performed by [name], MD. With the following instrument(s): Curette to remove Non-Viable tissue/material. Material removed includes [facility] after achieving pain control using Lidocaine 4% Topical Solution. No specimens were taken. A time out was conducted at 10:22, prior to the start of the procedure. A Minimum amount of bleeding was controlled with Pressure. The procedure was tolerated  well with a pain level of 0 throughout and a pain level of 0 following the procedure. Post Debridement Measurements: 0.2cm length x 0.3cm width x 0.2cm depth; 0.009cm^3 volume. Post debridement Stage noted as Category/Stage III. Character of Wound/Ulcer Post Debridement is improved. Post procedure Diagnosis Wound #2: Same as Pre-Procedure General Notes: Scribed for Dr. Lady Gary by J.Scotton. Pre-procedure diagnosis of Wound #2 is a Pressure Ulcer located on the Left Calcaneus . There was a T Contact Cast Procedure by [name], MD. otal Post procedure Diagnosis Wound #2: Same as Pre-Procedure Plan Follow-up Appointments: Return Appointment in 1 week. - Dr Lady Gary Room 3 Thursday 04/28/22 at 9:30am Anesthetic: Wound #2 Left Calcaneus: (In clinic) Topical Lidocaine 4% applied to wound bed - In clinic Bathing/ Shower/ Hygiene: May shower with protection but do not get wound dressing(s) wet. - Cover with cast protector (can purchase cast protector at [facility] or [facility] ) Edema Control - Lymphedema / SCD / Other: Elevate legs to the level of the heart or above for 30 minutes daily and/or when sitting, a frequency of: - throughout the day Avoid standing for long periods of time. Moisturize legs daily. - right leg and foot every night. Off-Loading: T Contact Cast to Left Lower Extremity - TCC to Left LL otal Other: - keep pressure off of the bottom of your feet Additional Orders / Instructions: Follow Nutritious Diet WOUND #2: - Calcaneus Wound Laterality: Left Cleanser: Normal Saline (Generic) 1 x Per Week/30 Days Discharge Instructions: Cleanse the wound with Normal Saline prior to applying a clean dressing using gauze sponges, not tissue or cotton balls. Cleanser: Wound Cleanser 1 x Per Week/30 Days Discharge Instructions: Cleanse the wound with wound cleanser prior to applying a clean dressing using gauze sponges, not tissue or cotton balls. Prim Dressing: Endoform 2x2 in 1 x Per  Week/30 Days ary Discharge Instructions: Moisten with saline Secondary Dressing: Optifoam Non-Adhesive Dressing, 4x4 in (Generic) 1 x Per Week/30 Days Discharge Instructions: Apply over primary dressing as directed. Secondary Dressing: Woven Gauze Sponge, Non-Sterile 4x4 in 1 x Per Week/30 Days Discharge Instructions: Apply over primary dressing as directed. Secured With: 36M Medipore H Soft Cloth Surgical T ape, 4 x 10 (in/yd) (Generic) 1 x Per Week/30 Days Discharge Instructions: Secure with tape as directed. 04/21/2022: The wound continues to narrow. Just a little bit of slough on the surface. I used a curette to remove the slough. We will continue to use endoform and total contact casting. Follow-up in 1 week. Electronic Signature(s) Signed: 04/21/2022 10:42:46 AM By: [name] MD FACS Entered By: [name] on 04/21/2022 10:42:46 -------------------------------------------------------------------------------- HxROS Details Patient Name: Date of Service: GO  [name], [place] 04/21/2022 9:30 A M Medical Record Number: [id number] Patient Account Number: [id number] Date of Birth/Sex: Treating RN: 11-Feb-1976 (46 y.o. M) Primary Care Provider: [name] Other Clinician: Referring Provider: Treating Provider/Extender: [name] in Treatment: 10 Information Obtained From Patient Constitutional Symptoms (General Health) Medical History: Past Medical History Notes: COVID PNA 07/22/2019-11/14/2019 VENT ECMO, foot drop left foot , Eyes Medical History: Negative for: Cataracts; Glaucoma; Optic Neuritis Ear/Nose/Mouth/Throat Medical History: Negative for: Chronic sinus problems/congestion; Middle ear problems Hematologic/Lymphatic Medical History: Negative for: Anemia; Hemophilia; Human Immunodeficiency Virus; Lymphedema; Sickle Cell Disease Respiratory Medical History: Positive for: Asthma Negative for: Aspiration; Chronic Obstructive Pulmonary  Disease (COPD); Pneumothorax; Sleep Apnea; Tuberculosis Cardiovascular Medical History: Positive for: Angina - with COVID; Hypertension Negative for: Arrhythmia; Congestive Heart Failure; Coronary Artery Disease; Deep Vein Thrombosis; Hypotension; Myocardial Infarction; Peripheral Arterial Disease; Peripheral Venous Disease; Phlebitis; Vasculitis Gastrointestinal Medical History: Negative for: Cirrhosis ; Colitis; Crohns; Hepatitis A; Hepatitis B; Hepatitis C Endocrine Medical History: Negative for: Type I Diabetes; Type II Diabetes Genitourinary Medical History: Negative for: End Stage Renal Disease Past Medical History Notes: kidney stone Immunological Medical History: Negative for: Lupus Erythematosus; Raynauds; Scleroderma Integumentary (Skin) Medical History: Negative for: History of Burn Musculoskeletal Medical History: Negative for: Gout; Rheumatoid Arthritis; Osteoarthritis; Osteomyelitis Neurologic Medical History: Negative for: Dementia; Neuropathy; Quadriplegia; Paraplegia; Seizure Disorder Oncologic Medical History: Negative for: Received Chemotherapy; Received Radiation Psychiatric Medical History: Negative for: Anorexia/bulimia; Confinement Anxiety Past Medical History Notes: anxiety Immunizations Pneumococcal Vaccine: Received Pneumococcal Vaccination: No Implantable Devices None Hospitalization / Surgery History Type of Hospitalization/Surgery COVID PNA 07/22/2019- 11/14/2019 03/27/2020 wound debridement/ skin graft Family and Social History Cancer: Yes - Maternal Grandparents; Diabetes: Yes - Father,Paternal Grandparents; Heart Disease: Yes - Maternal Grandparents; Hereditary Spherocytosis: No; Hypertension: Yes - Father,Paternal Grandparents; Kidney Disease: No; Lung Disease: Yes - Siblings; Seizures: No; Stroke: No; Thyroid Problems: No; Tuberculosis: No; Never smoker; Marital Status - Married; Alcohol Use: Never; Drug Use: No History; Caffeine Use:  Daily - tea, soda; Financial Concerns: No; Food, Clothing or Shelter Needs: No; Support System Lacking: No; Transportation Concerns: No Electronic Signature(s) Signed: 04/21/2022 10:44:26 AM By: Fredirick Maudlin MD FACS Entered By: Fredirick Maudlin on 04/21/2022 10:41:38 -------------------------------------------------------------------------------- Total Contact Cast Details Patient Name: Date of Service: [name], [place] 04/21/2022 9:30 A M Medical Record Number: [id number] Patient Account Number: [id number] Date of Birth/Sex: Treating RN: 06-Aug-1976 (46 y.o. [name] Primary Care Provider: [name] Other Clinician: Referring Provider: Treating Provider/Extender: [name] in Treatment: 40 T Contact Cast Applied for Wound Assessment: otal Wound #2 Left Calcaneus Performed By: Physician Fredirick Maudlin, MD Post Procedure Diagnosis Same as Pre-procedure Electronic Signature(s) Signed: 04/21/2022 10:44:26 AM By: Fredirick Maudlin MD FACS Signed: 04/21/2022 5:35:48 PM By: [name] Entered By: [name] on 04/21/2022 10:29:15 -------------------------------------------------------------------------------- [place] Details Patient Name: Date of Service: [name], [place]. 04/21/2022 Medical Record Number: [id number] Patient Account Number: [id number] Date of Birth/Sex: Treating RN: 02-07-1976 (46 y.o. M) Primary Care Provider: [name] Other Clinician: Referring Provider: Treating Provider/Extender: [name] in Treatment: 92 Diagnosis Coding ICD-10 Codes Code Description [phone] Non-pressure chronic ulcer of other part of left foot with fat layer exposed Facility Procedures CPT4 Code: [id number] Description: [phone] - APPLY TOTAL CONTACT LEG CAST ICD-10 Diagnosis Description L97.522 Non-pressure chronic ulcer of other part of left foot with fat layer exposed Modifier: Quantity: 1 Physician  Procedures : CPT4 Code Description Modifier [phone]  [id number] - WC PHYS LEVEL 3 - EST PT 25 ICD-10 Diagnosis Description L97.522 Non-pressure chronic ulcer of other part of left foot with fat layer exposed Quantity: 1 : [id number] - WC PHYS APPLY TOTAL CONTACT CAST ICD-10 Diagnosis Description L97.522 Non-pressure chronic ulcer of other part of left foot with fat layer exposed Quantity: 1 Electronic Signature(s) Signed: 04/21/2022 10:43:10 AM By: Fredirick Maudlin MD FACS Entered By: Fredirick Maudlin on 04/21/2022 10:43:10

## 2022-04-25 ENCOUNTER — Telehealth: Payer: Self-pay | Admitting: Behavioral Health

## 2022-04-25 NOTE — Telephone Encounter (Signed)
Patient is asking for Auvelity not Abilify. Rx was sent 8/1 with refills. Usually filled thru [facility].

## 2022-04-25 NOTE — Telephone Encounter (Signed)
Patient lvm at 11:09 with refill request for Abilify 2mg . States that pharmacy needs approval from CR to approve. Ph: [phone]

## 2022-04-27 DIAGNOSIS — F339 Major depressive disorder, recurrent, unspecified: Secondary | ICD-10-CM | POA: Diagnosis not present

## 2022-04-27 DIAGNOSIS — Z0000 Encounter for general adult medical examination without abnormal findings: Secondary | ICD-10-CM | POA: Diagnosis not present

## 2022-04-27 DIAGNOSIS — J45909 Unspecified asthma, uncomplicated: Secondary | ICD-10-CM | POA: Diagnosis not present

## 2022-04-27 DIAGNOSIS — Z1339 Encounter for screening examination for other mental health and behavioral disorders: Secondary | ICD-10-CM | POA: Diagnosis not present

## 2022-04-27 DIAGNOSIS — Z6841 Body Mass Index (BMI) 40.0 and over, adult: Secondary | ICD-10-CM | POA: Diagnosis not present

## 2022-04-27 DIAGNOSIS — R7303 Prediabetes: Secondary | ICD-10-CM | POA: Diagnosis not present

## 2022-04-27 DIAGNOSIS — I10 Essential (primary) hypertension: Secondary | ICD-10-CM | POA: Diagnosis not present

## 2022-04-27 DIAGNOSIS — Z1331 Encounter for screening for depression: Secondary | ICD-10-CM | POA: Diagnosis not present

## 2022-04-27 DIAGNOSIS — E8881 Metabolic syndrome: Secondary | ICD-10-CM | POA: Diagnosis not present

## 2022-04-27 DIAGNOSIS — J9610 Chronic respiratory failure, unspecified whether with hypoxia or hypercapnia: Secondary | ICD-10-CM | POA: Diagnosis not present

## 2022-04-27 DIAGNOSIS — G7281 Critical illness myopathy: Secondary | ICD-10-CM | POA: Diagnosis not present

## 2022-04-28 ENCOUNTER — Other Ambulatory Visit: Payer: Self-pay | Admitting: Allergy & Immunology

## 2022-04-28 ENCOUNTER — Encounter (HOSPITAL_BASED_OUTPATIENT_CLINIC_OR_DEPARTMENT_OTHER): Payer: PPO | Admitting: General Surgery

## 2022-04-28 DIAGNOSIS — L89620 Pressure ulcer of left heel, unstageable: Secondary | ICD-10-CM | POA: Diagnosis not present

## 2022-04-28 DIAGNOSIS — L97422 Non-pressure chronic ulcer of left heel and midfoot with fat layer exposed: Secondary | ICD-10-CM | POA: Diagnosis not present

## 2022-04-28 NOTE — Progress Notes (Signed)
[name], [name] ([id number]) Visit Report for 04/28/2022 Arrival Information Details Patient Name: Date of Service: [name], [place] 04/28/2022 9:30 A M Medical Record Number: [id number] Patient Account Number: [id number] Date of Birth/Sex: Treating RN: 1975-08-27 (46 y.o. [name] Primary Care Halaina Vanduzer: [name] Other Clinician: Referring Noha Karasik: Treating [name]/Extender: [name] in Treatment: 8 Visit Information History Since Last Visit Added or deleted any medications: No Patient Arrived: Wheel Chair Any new allergies or adverse reactions: No Arrival Time: 09:36 Had a fall or experienced change in No Accompanied By: spouse activities of daily living that may affect Transfer Assistance: None risk of falls: Patient Identification Verified: Yes Hospitalized since last visit: No Patient Requires Transmission-Based Precautions: No Implantable device outside of the clinic excluding No Patient Has Alerts: No cellular tissue based products placed in the center since last visit: Has Dressing in Place as Prescribed: Yes Pain Present Now: No Electronic Signature(s) Signed: 04/28/2022 5:27:48 PM By: [name] Entered By: [name] on 04/28/2022 10:03:08 -------------------------------------------------------------------------------- Encounter Discharge Information Details Patient Name: Date of Service: [name], [place] 04/28/2022 9:30 A M Medical Record Number: [id number] Patient Account Number: [id number] Date of Birth/Sex: Treating RN: 1976/02/06 (46 y.o. [name] Primary Care Kristi Hyer: [name] Other Clinician: Referring Captola Teschner: Treating Emmary Culbreath/Extender: [name] in Treatment: 66 Encounter Discharge Information Items Discharge Condition: Stable Ambulatory Status: Wheelchair Discharge Destination: Home Transportation: Private Auto Accompanied By: spouse Schedule Follow-up  Appointment: Yes Clinical Summary of Care: Patient Declined Electronic Signature(s) Signed: 04/28/2022 5:27:48 PM By: [name] Entered By: [name] on 04/28/2022 17:21:43 -------------------------------------------------------------------------------- Lower Extremity Assessment Details Patient Name: Date of Service: [name], [place] 04/28/2022 9:30 A M Medical Record Number: [id number] Patient Account Number: [id number] Date of Birth/Sex: Treating RN: 06-23-1976 (46 y.o. [name] Primary Care Cierra Rothgeb: [name] Other Clinician: Referring Xana Bradt: Treating Brailyn Delman/Extender: [name] in Treatment: 93 Edema Assessment Assessed: [name]: No] [Right: No] Edema: [Left: N] [Right: o] Calf Left: Right: Point of Measurement: 29 cm From Medial Instep 39.8 cm 39 cm Ankle Left: Right: Point of Measurement: 9 cm From Medial Instep 25.4 cm 25 cm Electronic Signature(s) Signed: 04/28/2022 5:27:48 PM By: [name] Entered By: [name] on 04/28/2022 10:03:59 -------------------------------------------------------------------------------- Multi Wound Chart Details Patient Name: Date of Service: [name], [place] 04/28/2022 9:30 A M Medical Record Number: [id number] Patient Account Number: [id number] Date of Birth/Sex: Treating RN: 1976/06/14 (46 y.o. M) Primary Care Genie Mirabal: [name] Other Clinician: Referring Josealfredo Adkins: Treating Akacia Boltz/Extender: [name] in Treatment: 68 Vital Signs Height(in): 69 Pulse(bpm): 57 Weight(lbs): 280 Blood Pressure(mmHg): 143/84 Body Mass Index(BMI): 41.3 Temperature(F): 98.5 Respiratory Rate(breaths/min): 18 Photos: [N/A:N/A] Left Calcaneus N/A N/A Wound Location: Pressure Injury N/A N/A Wounding Event: Pressure Ulcer N/A N/A Primary Etiology: Asthma, Angina, Hypertension N/A N/A Comorbid History: 10/07/2019 N/A N/A Date Acquired: 66 N/A  N/A Weeks of Treatment: Open N/A N/A Wound Status: No N/A N/A Wound Recurrence: 0.4x0.3x0.2 N/A N/A Measurements L x W x D (cm) 0.094 N/A N/A A (cm) : rea 0.019 N/A N/A Volume (cm) : 99.70% N/A N/A % Reduction in A rea: 99.90% N/A N/A % Reduction in Volume: Category/Stage III N/A N/A Classification: Medium N/A N/A Exudate A mount: Serosanguineous N/A N/A Exudate Type: red, brown N/A N/A Exudate Color: Thickened N/A N/A Wound Margin: Small (1-33%) N/A N/A Granulation A mount: Red N/A N/A Granulation Quality: Large (  67-100%) N/A N/A Necrotic A mount: Fat Layer (Subcutaneous Tissue): Yes N/A N/A Exposed Structures: Fascia: No Tendon: No Muscle: No Joint: No Bone: No Small (1-33%) N/A N/A Epithelialization: Debridement - Selective/Open Wound N/A N/A Debridement: Pre-procedure Verification/Time Out 10:10 N/A N/A Taken: Lidocaine 4% T opical Solution N/A N/A Pain Control: Callus, Slough N/A N/A Tissue Debrided: Non-Viable Tissue N/A N/A Level: 0.12 N/A N/A Debridement A (sq cm): rea Curette N/A N/A Instrument: Minimum N/A N/A Bleeding: Pressure N/A N/A Hemostasis A chieved: 0 N/A N/A Procedural Pain: 0 N/A N/A Post Procedural Pain: Procedure was tolerated well N/A N/A Debridement Treatment Response: 0.4x0.3x0.2 N/A N/A Post Debridement Measurements L x W x D (cm) 0.019 N/A N/A Post Debridement Volume: (cm) Category/Stage III N/A N/A Post Debridement Stage: Debridement N/A N/A Procedures Performed: Treatment Notes Electronic Signature(s) Signed: 04/28/2022 10:19:24 AM By: Fredirick Maudlin MD FACS Entered By: Fredirick Maudlin on 04/28/2022 10:19:24 -------------------------------------------------------------------------------- Multi-Disciplinary Care Plan Details Patient Name: Date of Service: [name], [place] 04/28/2022 9:30 A M Medical Record Number: [id number] Patient Account Number: [id number] Date of Birth/Sex: Treating  RN: 08/29/1975 (46 y.o. [name] Primary Care Jamicia Haaland: [name] Other Clinician: Referring Monty Mccarrell: Treating [name]/Extender: [name] in Treatment: 33 Multidisciplinary Care Plan reviewed with physician Active Inactive Wound/Skin Impairment Nursing Diagnoses: Impaired tissue integrity Knowledge deficit related to ulceration/compromised skin integrity Goals: Patient/caregiver will verbalize understanding of skin care regimen Date Initiated: 07/15/2020 Target Resolution Date: 07/07/2022 Goal Status: Active Ulcer/skin breakdown will have a volume reduction of 30% by week 4 Date Initiated: 07/15/2020 Date Inactivated: 08/20/2020 Target Resolution Date: 09/03/2020 Goal Status: Unmet Unmet Reason: no major changes. Ulcer/skin breakdown will heal within 14 weeks Date Initiated: 12/04/2020 Date Inactivated: 12/10/2020 Target Resolution Date: 12/10/2020 Unmet Reason: wounds still open at 14 Goal Status: Unmet weeks and today 21 weeks. Interventions: Assess patient/caregiver ability to obtain necessary supplies Assess patient/caregiver ability to perform ulcer/skin care regimen upon admission and as needed Assess ulceration(s) every visit Provide education on ulcer and skin care Treatment Activities: Skin care regimen initiated : 07/15/2020 Topical wound management initiated : 07/15/2020 Notes: Electronic Signature(s) Signed: 04/28/2022 5:27:48 PM By: [name] Entered By: [name] on 04/28/2022 17:19:40 -------------------------------------------------------------------------------- Pain Assessment Details Patient Name: Date of Service: [name], [place] 04/28/2022 9:30 A M Medical Record Number: [id number] Patient Account Number: [id number] Date of Birth/Sex: Treating RN: [birth year] (46 y.o. [name] Primary Care Oluwaferanmi Wain: [name] Other Clinician: Referring Franklin Clapsaddle: Treating Evora Schechter/Extender: [name] in Treatment: 37 Active Problems Location of Pain Severity and Description of Pain Patient Has Paino No Site Locations Pain Management and Medication Current Pain Management: Electronic Signature(s) Signed: 04/28/2022 5:27:48 PM By: [name] Entered By: [name] on 04/28/2022 10:03:39 -------------------------------------------------------------------------------- Patient/Caregiver Education Details Patient Name: Date of Service: Ciro Backer 9/21/2023andnbsp9:30 Jane Lew Record Number: [id number] Patient Account Number: [id number] Date of Birth/Gender: Treating RN: 26-Aug-1975 (45 y.o. [name] Primary Care Physician: [name] Other Clinician: Referring Physician: Treating Physician/Extender: [name] in Treatment: 10 Education Assessment Education Provided To: Patient Education Topics Provided Wound/Skin Impairment: Methods: Explain/Verbal Responses: Return demonstration correctly Electronic Signature(s) Signed: 04/28/2022 5:27:48 PM By: [name] Entered By: [name] on 04/28/2022 17:19:54 -------------------------------------------------------------------------------- Wound Assessment Details Patient Name: Date of Service: [name], [place] 04/28/2022 9:30 A M Medical Record Number: [id number] Patient Account Number: [id number] Date of Birth/Sex: Treating RN: [birth year] (  46 y.o. [name] Primary Care Junior Kenedy: [name] Other Clinician: Referring Marylee Belzer: Treating Kennard Fildes/Extender: [name] in Treatment: 48 Wound Status Wound Number: 2 Primary Etiology: Pressure Ulcer Wound Location: Left Calcaneus Wound Status: Open Wounding Event: Pressure Injury Comorbid History: Asthma, Angina, Hypertension Date Acquired: 10/07/2019 Weeks Of Treatment: 93 Clustered Wound: No Photos Wound Measurements Length: (cm) 0.4 Width:  (cm) 0.3 Depth: (cm) 0.2 Area: (cm) 0.094 Volume: (cm) 0.019 % Reduction in Area: 99.7% % Reduction in Volume: 99.9% Epithelialization: Small (1-33%) Tunneling: No Undermining: No Wound Description Classification: Category/Stage III Wound Margin: Thickened Exudate Amount: Medium Exudate Type: Serosanguineous Exudate Color: red, brown Foul Odor After Cleansing: No Slough/Fibrino No Wound Bed Granulation Amount: Small (1-33%) Exposed Structure Granulation Quality: Red Fascia Exposed: No Necrotic Amount: Large (67-100%) Fat Layer (Subcutaneous Tissue) Exposed: Yes Necrotic Quality: Adherent Slough Tendon Exposed: No Muscle Exposed: No Joint Exposed: No Bone Exposed: No Treatment Notes Wound #2 (Calcaneus) Wound Laterality: Left Cleanser Normal Saline Discharge Instruction: Cleanse the wound with Normal Saline prior to applying a clean dressing using gauze sponges, not tissue or cotton balls. Wound Cleanser Discharge Instruction: Cleanse the wound with wound cleanser prior to applying a clean dressing using gauze sponges, not tissue or cotton balls. Peri-Wound Care Topical Primary Dressing Endoform 2x2 in Discharge Instruction: Moisten with saline Secondary Dressing Optifoam Non-Adhesive Dressing, 4x4 in Discharge Instruction: Apply over primary dressing as directed. Woven Gauze Sponge, Non-Sterile 4x4 in Discharge Instruction: Apply over primary dressing as directed. Secured With 81M Medipore H Soft Cloth Surgical T ape, 4 x 10 (in/yd) Discharge Instruction: Secure with tape as directed. Compression Wrap Compression Stockings Add-Ons Electronic Signature(s) Signed: 04/28/2022 5:27:48 PM By: [name] Entered By: [name] on 04/28/2022 10:07:01 -------------------------------------------------------------------------------- Vitals Details Patient Name: Date of Service: [name], [place] 04/28/2022 9:30 A M Medical Record Number:  [id number] Patient Account Number: [id number] Date of Birth/Sex: Treating RN: Feb 08, 1976 (46 y.o. [name] Primary Care Marguetta Windish: [name] Other Clinician: Referring Trace Wirick: Treating Mayme Profeta/Extender: [name] in Treatment: 54 Vital Signs Time Taken: 09:37 Temperature (F): 98.5 Height (in): 69 Pulse (bpm): 86 Weight (lbs): 280 Respiratory Rate (breaths/min): 18 Body Mass Index (BMI): 41.3 Blood Pressure (mmHg): 143/84 Reference Range: 80 - 120 mg / dl Electronic Signature(s) Signed: 04/28/2022 5:27:48 PM By: [name] Entered By: [name] on 04/28/2022 10:03:32

## 2022-04-29 NOTE — Progress Notes (Signed)
AZZAN, NAVAS ([id number]) Visit Report for 04/28/2022 Chief Complaint Document Details Patient Name: Date of Service: [name] Hoven 04/28/2022 9:30 A M Medical Record Number: [id number] Patient Account Number: [id number] Date of Birth/Sex: Treating RN: [birth year] (46 y.o. M) Primary Care Provider: [name] Other Clinician: Referring Provider: Treating Provider/Extender: [name] in Treatment: 62 Information Obtained from: Patient Chief Complaint Bilateral Plantar Foot Ulcers Electronic Signature(s) Signed: 04/28/2022 10:19:30 AM By: Fredirick Maudlin MD FACS Entered By: Fredirick Maudlin on 04/28/2022 10:19:29 -------------------------------------------------------------------------------- HPI Details Patient Name: Date of Service: [name], McIntosh 04/28/2022 9:30 A M Medical Record Number: [id number] Patient Account Number: [id number] Date of Birth/Sex: Treating RN: February 24, 1976 (46 y.o. M) Primary Care Provider: [name] Other Clinician: Referring Provider: Treating Provider/Extender: [name] in Treatment: 103 History of Present Illness HPI Description: Wounds are12/03/2020 upon evaluation today patient presents for initial inspection here in our clinic concerning issues he has been having with the bottoms of his feet bilaterally. He states these actually occurred as wounds when he was hospitalized for 5 months secondary to Covid. He was apparently with tilting bed where he was in an upright position quite frequently and apparently this occurred in some way shape or form during that time. Fortunately there is no sign of active infection at this time. No fevers, chills, nausea, vomiting, or diarrhea. With that being said he still has substantial wounds on the plantar aspects of his feet Theragen require quite a bit of work to get these to heal. He has been using Santyl currently though that is been problematic both in  receiving the medication as well as actually paid for it as it is become quite expensive. Prior to the experience with Covid the patient really did not have any major medical problems other than hypertension he does have some mild generalized weakness following the Covid experience. 07/22/2020 on evaluation today patient appears to be doing okay in regard to his foot ulcers I feel like the wound beds are showing signs of better improvement that I do believe the Iodoflex is helping in this regard. With that being said he does have a lot of drainage currently and this is somewhat blue/green in nature which is consistent with Pseudomonas. I do think a culture today would be appropriate for [place] to evaluate and see if that is indeed the case I would likely start him on antibiotic orally as well he is not allergic to Cipro knows of no issues he has had in the past 12/21; patient was admitted to the clinic earlier this month with bilateral presumed pressure ulcers on the bottom of his feet apparently related to excessive pressure from a tilt table arrangement in the intensive care unit. Patient relates this to being on ECMO but I am not really sure that is exactly related to that. I must say I have never seen anything like this. He has fairly extensive full-thickness wounds extending from his heel towards his midfoot mostly centered laterally. There is already been some healing distally. He does not appear to have an arterial issue. He has been using gentamicin to the wound surfaces with Iodoflex to help with ongoing debridement 1/6; this is a patient with pressure ulcers on the bottom of his feet related to excessive pressure from a standing position in the intensive care unit. He is complaining of a lot of pain in the right heel. He is not a diabetic. 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 We have been using Iodoflex to help prepare the surfaces of both wounds for an advanced  treatment product. He is nonambulatory spending most of his time in a wheelchair I have asked him not to propel the wheelchair with his heels 1/13; in general his wounds look better not much surface area change we have been using Iodoflex as of last week. I did an x-ray of the right heel as the patient was complaining of pain. I had some thoughts about a stress fracture perhaps Achilles tendon problems however what it showed was erosive changes along the inferior aspect of the calcaneus he now has a MRI booked for 1/20. 1/20; in general his wounds continue to be better. Some improvement in the large narrow areas proximally in his foot. He is still complaining of pain in the right heel and tenderness in certain areas of this wound. His MRI is tonight. I am not just looking for osteomyelitis that was brought up on the x-ray I am wondering about stress fractures, tendon ruptures etc. He has no such findings on the left. Also noteworthy is that the patient had critical illness neuropathy and some of the discomfort may be actual improvement in nerve function I am just not sure. These wounds were initially in the setting of severe critical illness related to COVID-19. He was put in a standing position. He may have also been on pressors at the point contributing to tissue ischemia. By his description at some point these wounds were grossly necrotic extending proximally up into the Achilles part of his heel. I do not know that I have ever really seen pictures of them like this although they may exist in epic We have ordered Tri layer Oasis. I am trying to stimulate some granulation in these areas. This is of course assuming the MRI is negative for infection 1/27; since the patient was last here he saw Dr. Juleen China of infectious disease. He is planned for vancomycin and ceftriaxone. Prior operative culture grew MSSA. Also ordered baseline lab work. He also ordered arterial studies although the ABIs in our  clinic were normal as well as his clinical exam these were normal I do not think he needs to see vascular surgery. His ABIs at the PTA were 1.22 in the right triphasic waveforms with a normal TBI of 1.15 on the left ABI of 1.22 with triphasic waveforms and a normal TBI of 1.08. Finally he saw Dr. Amalia Hailey who will follow him in for 2 months. At this point I do not think he felt that he needed a procedure on the right calcaneal bone. Dr. Juleen China is elected for broad-spectrum antibiotic The patient is still having pain in the right heel. He walks with a walker 2/3; wounds are generally smaller. He is tolerating his IV antibiotics. I believe this is vancomycin and ceftriaxone. We are still waiting for Oasis burn in terms of his out-of-pocket max which he should be meeting soon given the IV antibiotics, MRIs etc. I have asked him to check in on this. We are using silver collagen in the meantime the wounds look better 2/10; tolerating IV vancomycin and Rocephin. We are waiting to apply for Oasis. Although I am not really sure where he is in his out-of-pocket max. 2/17 started the first application of Oasis trilayer. Still on antibiotics. The wounds have generally look better. The area on the left has a little more surface slough requiring debridement 8/18; second application of Oasis trilayer. The wound surface  granulation is generally look better. The area on the left with undermining laterally I think is come in a bit. 10/08/2020 upon evaluation today patient is here today for [employer] application #3. Fortunately he seems to be doing extremely well with regard to this and we are seeing a lot of new epithelial growth which is great news. Fortunately there is no signs of active infection at this time. 10/16/2020 upon evaluation today patient appears to be doing well with regard to his foot ulcers. Do believe the Oasis has been of benefit for him. I do not see any signs of infection right now which is  great news and I think that he has a lot of new epithelial growth which is great to see as well. The patient is very pleased to hear all of this. I do think we can proceed with the Oasis trilayer #4 today. 3/18; not as much improvement in these areas on his heels that I was hoping. I did reapply trilateral Oasis today the tissue looks healthier but not as much fill in as I was hoping. 3/25; better looking today I think this is come in a bit the tissue looks healthier. Triple layer Oasis reapplied #6 4/1; somewhat better looking definitely better looking surface not as much change in surface area as I was hoping. He may be spending more time [place] on days then he needs to although he does have heel offloading boots. Triple layer Oasis reapplied #7 4/7; unfortunately apparently [facility] will not approve any further Oasis which is unfortunate since the patient did respond nicely both in terms of the condition of the wound bed as well as surface area. There is still some drainage coming from the wound but not a lot there does not appear to be any infection 4/15; we have been using Hydrofera Blue. He continues to have nice rims of epithelialization on the right greater than the left. The left the epithelialization is coming from the tip of his heel. There is moderate drainage. In this that concerns me about a total contact cast. There is no evidence of infection 4/29; patient has been using Hydrofera Blue with dressing changes. He has no complaints or issues today. 5/5; using Hydrofera Blue. I actually think that he looks marginally better than the last time I saw this 3 weeks ago. There are rims of epithelialization on the left thumb coming from the medial side on the right. Using Hydrofera Blue 5/12; using Hydrofera Blue. These continue to make improvements in surface area. His drainage was not listed as severe I therefore went ahead and put a cast on the left foot. Right foot we will  continue to dress his previous 5/16; back for first total contact cast change. He did not tolerate this particularly well cast injury on the anterior tibia among other issues. Difficulty sleeping. I talked him about this in some detail and afterwards is elected to continue. I told him I would like to have a cast on for 3 weeks to see if this is going to help at all. I think he agreed 5/19; I think the wound is better. There is no tunneling towards his midfoot. The undermining medially also looks better. He has a rim of new skin distally. I think we are making progress here. The area on the left also continues to look somewhat better to me using Hydrofera Blue. He has a list of complaints about the cast but none of them seem serious 5/26; patient  presents for 1 week follow-up. He has been using a total contact cast and tolerating this well. Hydrofera Blue is the main dressing used. He denies signs of infection. 6/2 Hydrofera Blue total contact cast on the left. These were large ulcers that formed in intensive care unit where the patient was recovering from [place]. May have had something to do with being ventilated in an upright positiono Pressors etc. We have been able to get the areas down considerably and a viable surface. There is some epithelialization in both sides. Note made of drainage 6/9; changed to [facility] last time because of drainage. He arrives with better looking surfaces and dimensions on the left than the right. Paradoxically the right actually probes more towards his midfoot the left is largely close down but both of these look improved. Using a total contact cast on the left 6/16; complex wounds on his bilateral plantar heels which were initially pressure injury from a stay in the ICU with COVID. We have been using silver alginate most recently. His dimensions of come in quite dramatically however not recently. We have been putting the left foot in a total contact cast 6/23;  complex wounds on the bilateral plantar heels. I been putting the left in the cast paradoxically the area on the right is the one that is going towards closure at a faster rate. Quite a bit of drainage on the left. The patient went to see Dr. Amalia Hailey who said he was going to standby for skin grafts. I had actually considered sending him for skin grafts however he would be mandatorily off his feet for a period of weeks to months. I am thinking that the area on the right is going to close on its own the area on the left has been more stubborn even though we have him in a total contact cast 6/30; took him out of a total contact cast last week is the right heel seem to be making better progress than the left where I was placing the cast. We are using silver alginate. Both wounds are smaller right greater than left 7/12; both wounds look as though they are making some progress. We are using silver alginate. Heel offloading boots 7/26; very gradual progress especially on the right. Using silver alginate. He is wearing heel offloading boots 8/18; he continues to close these wounds down very gradually. Using silver alginate. The problem polymen being definitive about this is areas of what appears to be callus around the margins. This is not a 100% of the area but certainly sizable especially on the right 9/1; bilateral plantar feet wounds secondary to prolonged pressure while being ventilated for COVID-19 in an upright position. Essentially pressure ulcers on the bottom of his feet. He is made substantial progress using silver alginate. 9/14; bilateral plantar feet wounds secondary to prolonged pressure. Making progress using silver alginate. 9/29 bilateral plantar feet wounds secondary to prolonged pressure. I changed him to Iodoflex last week. MolecuLight showing reddened blush fluorescence 10/11; patient presents for follow-up. He has no issues or complaints today. He denies signs of infection. He continues  to use Iodoflex and antibiotic ointment to the wound beds. 10/27; 2-week follow-up. No evidence of infection. He has callus and thick dry skin around the wound margins we have been using Iodoflex and Bactroban which was in response to a moderate left MolecuLight reddish blush fluorescence. 11/10; 2-week follow-up. Wound margins again have thick callus however the measurements of the actual wound sites are a lot  smaller. Everything looks reasonably healthy here. We have been using Iodoflex He was approved for prime matrix but I have elected to delay this given the improvement in the surface area. Hopefully I will not regret that decision as were getting close to the end of the year in terms of insurance payment 12/8; 2-week follow-up. Wounds are generally smaller in size. These were initially substantial wounds extending into the forefoot all the way into the heel on the bilateral plantar feet. They are now both located on the plantar heel distal aspect both of these have a lot of callus around the wounds I used a #5 curette to remove this on the right and the left also some subcutaneous debris to try and get the wound edges were using Iodoflex. He has heel offloading shoe 12/22; 2-week follow-up. Not really much improvement. He has thick callus around the outer edges of both wounds. I remove this there is some nonviable subcutaneous tissue as well. We have been using Iodoflex. Her intake nurse and myself spontaneously thought of a total contact cast I went back in May. At that time we really were not seeing much of an improvement with a cast although the wound was in a much different situation I would like to retry this in 2 weeks and I discussed this with the patient 08/12/2021; the patient has had some improvement with the Iodoflex. The the area on the left heel plantar more improved than the right. I had to put him in a total contact cast on the left although I decided to put that off for 2 weeks. I  am going to change his primary dressing to silver collagen. I think in both areas he has had some improvement most of the healing seems to be more proximal in the heel. The wounds are in the mid aspect. A lot of thick callus on the right heel however. 1/19; we are using silver collagen on both plantar heel areas. He has had some improvement today. The left did not require any debridement. He still had some eschar on the right that was debrided but both seem to have contracted. I did not put it total contact cast on him today 2/2 we have been using silver collagen. The area on the right plantar heel has areas that appear to be epithelialized interspersed with dry flaking callus and dry skin. I removed this. This really looks better than on the other side. On the left still a large area with raised edges and debris on the surface. 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 He has no issues or complaints today. 2/9; not much change in the left foot wound with 1 week of a cast we are using silver collagen. Silver collagen on the right side. The right side has been the better wound surface. We will reapply the total contact cast on the left 2/16; not much improvement on either side I been using silver collagen with a total contact cast on the left. I'm changing the Hydrofera Blue still with a total contact cast on the left 2/23; some improvement on both sides. Disappointing that he has thick callus around the area that we are putting in a total contact cast on the left. We've been using Hydrofera Blue on both wound areas. This is a man who at essentially pressure ulcers in addition to ischemia caused  by medications to support his blood pressure (pressors) in the ICU. He was being ventilated in the standing position for severe Covid. A Shiley the wounds extended across his  entire foot but are now localized to his plantar heels bilaterally. We have made progress however neither areas healed. I continue to think the total contact cast is helped albeit painstakingly slowly. He has never wanted a plastic surgery consult although I don't know that they would be interested in grafting in area in this location. 10/07/2021: Continued improvement bilaterally. There is still some callus around the left wound, despite the total contact cast. He has some increased pain in his right midfoot around 1 particular area. This has been painful in the past but seems to be a little bit worse. When his cast was removed today, there was an area on the heel of the left foot that looks a bit macerated. He is also complaining of pain in his left thigh and hip which he thinks is secondary to the limb length discrepancy caused by the cast. 10/14/2021: He continues to improve. A little bit less callus around the left wound. He continues to endorse pain in his right midfoot, but this is not as significant as it was last week. The maceration on his left heel is improved. 10/21/2021: Continued improvement to both wounds. The maceration on his left heel is no longer evident. Less callus bilaterally. Epithelialization progressing. 10/28/2021: Significant improvement this week. The right sided wound is nearly closed with just a small open area at the middle. No maceration seen on the left heel. Continued epithelialization on both sides. No concern for infection. 11/04/2021: T oday, the wounds were measured a little bit differently and come out as larger, but I actually think they are about the same to potentially even smaller, particularly on the left. He continues to accumulate some callus on the right. 11/11/2021: T oday, the patient is expressing some concern that the left wound, despite being in the total contact cast, is not progressing at the same rate as the 1 on the right. He is interested in trying a  week without the cast to see how the wound does. The wounds are roughly the same size as last week, with the right perhaps being a little bit smaller. He continues to build up callus on both sites. 11/18/2021: Last week, I permitted the patient to go without his total contact cast, just to see if the cast was really making any difference. Today, both wounds have deteriorated to some extent, suggesting that the cast is providing benefit, at least on the left. Both are larger and have accumulated callus, slough, and other debris. 11/26/2021: I debrided both wounds quite aggressively last week in an effort to stimulate the healing cascade. This appears to have been effective as the left sided wound is a full centimeter shorter in length. Although the right was measured slightly larger, on inspection, it looks as though an area of epithelialized tissue was included in the measurements. We have been using PolyMem Ag on the wound surfaces with a total contact cast to the left. 12/02/2021: It appears that the intake personnel are including epithelialized tissue in his wound measurements; the right wound is almost completely epithelialized; there is just a crater at the proximal midfoot with some open areas. On the left, he has built up some callus, but the overall wound surface looks good. There is some senescent skin around the wound margin. He has been in PolyMem Ag bilaterally with  a total contact cast on the left. 12/09/2021: The right wound is nearly closed; there is just a small open area at the mid calcaneus. On the left, the wound is smaller with minimal callus buildup. No significant drainage. 12/16/2021: The right calcaneal wound remains minimally open at the mid calcaneus; the rest has epithelialized. On the left, the wound is also a little bit smaller. There is some senescent tissue on the periphery. He is getting his first application of a trial skin substitute called Vendaje today. 12/23/2021: The  wound on his right calcaneus is nearly closed; there is just a small area at the most distal aspect of the calcaneus that is open. On the left, the area where we applied to the skin substitute has a healthier-looking bed of granulation tissue. The wound dimensions are not significantly different on this side but the wound surface is improved. 12/30/2021: The wound on the right calcaneus has not changed significantly aside from some accumulation of callus. On the left, the open area is smaller and continues to have an improved surface. He continues to accumulate callus around the wound. He is here for his third application of Vendaje. 01/06/2022: The right calcaneal wound is down to just a couple of millimeters. He continues to accumulate periwound callus. He unfortunately got his cast wet earlier in the week and his left foot is macerated, resulting in some superficial skin loss just distal to the open wound. The open wound itself, however, is much smaller and has a healthier appearing surface. He is here for his fourth application of Vendaje. 01/13/2022: The right calcaneal wound is about the same. Unfortunately, once again, his cast got wet and his foot is again macerated. This is caused the left calcaneal wound to enlarge. He is here for his fifth application of Vendaje. 01/20/2022: The right calcaneal wound is very small. There is some periwound callus accumulation. He purchased a new cast protector last week and this has been effective in avoiding the maceration that has been occurring on the left. The left calcaneal wound is narrower and has a healthy and viable-appearing surface. He is here for his 6 application of Vendaje. 01/27/2022: The right calcaneal wound is down to just a pinhole. There is some periwound slough and callus. On the left, the wound is narrower and shorter by about a centimeter. The surface is robust and viable-appearing. Unfortunately, the rep for the trial skin substitute product  did not provide any for [place] to use today. 02/04/2022: The right calcaneal wound remains unchanged. There is more accumulated callus. On the left, although the intake nurse measured it a little bit longer, it looks about the same to me. The surface has a layer of slough, but underneath this, there is good granulation tissue. 02/10/2022: The right calcaneus wound is nearly closed. There is still some callus that builds up around the site. The left side looks about the same in terms of dimensions, but the surface is more robust and vital-appearing. 02/16/2022: The area of the right calcaneus that was nearly closed last week has closed, but there is a small opening at the mid foot where it looks like some moisture got retained and caused some reopening. The left foot wound is narrower and shallower. Both sites have a fair amount of periwound callus and eschar. 02/24/2022: The small midfoot opening on the right calcaneus is a little bit smaller today. The left foot wound is narrower and shallower. He continues to accumulate periwound callus. No concern for infection.  03/01/2022: The patient came to clinic early because he showered and got his cast wet. Fortunately, there is no significant maceration to his foot but the callus softened and it looks like the wound on his left calcaneus may be a little bit wider. The wound on his right calcaneus is just a narrow slit. Continued accumulation of periwound callus bilaterally. 03/08/2022: The wound on his right calcaneus is very nearly closed, just a small pinpoint opening under a bit of eschar; the left wound has come in quite a bit, as well. It is narrower and shorter than at our last visit. Still with accumulated callus and eschar bilaterally. 03/17/2022: The right calcaneal wound is healed. The left wound is smaller and the surface itself is very clean, but there is some blue-green staining on the periwound callus, concerning for Pseudomonas aeruginosa. 03/23/2022:  The right calcaneal wound remains closed. The left wound continues to contract. No further blue-green staining. Small amount of callus and slough accumulation. 03/28/2022: He came in early today because he had gotten his cast wet. On inspection, the wound itself did not get wet or macerated, just a little bit of the forefoot. The wound itself is basically unchanged. 04/07/2022: The right foot wound remains closed. The left wound is the smallest that I have seen it to date. It is narrower and shorter. It still continues to accumulate slough on the surface. 04/15/2022: There is a band of epithelium now dividing the small left plantar foot wound in 2. There is still some slough on the surface. 04/21/2022: The wound continues to narrow. Just a little bit of slough on the surface. He seems to be responding well to endoform. 04/28/2022: Continued slow contraction of the wound. There is a little slough on the surface and some periwound callus. We have been using endoform and total contact cast. Electronic Signature(s) Signed: 04/28/2022 10:20:37 AM By: Fredirick Maudlin MD FACS Entered By: Fredirick Maudlin on 04/28/2022 10:20:37 -------------------------------------------------------------------------------- Physical Exam Details Patient Name: Date of Service: [name], [name] 04/28/2022 9:30 A M Medical Record Number: [id number] Patient Account Number: [id number] Date of Birth/Sex: Treating RN: [birth year] (46 y.o. M) Primary Care Provider: [name] Other Clinician: Referring Provider: Treating Provider/Extender: [name] in Treatment: 4 Constitutional Slightly hypertensive. . . . No acute distress.Marland Kitchen Respiratory Normal work of breathing on room air.. Notes 04/28/2022: Continued slow contraction of the wound. There is a little slough on the surface and some periwound callus. Electronic Signature(s) Signed: 04/28/2022 10:21:13 AM By: Fredirick Maudlin MD FACS Entered By:  Fredirick Maudlin on 04/28/2022 10:21:12 -------------------------------------------------------------------------------- Physician Orders Details Patient Name: Date of Service: [name], [place] 04/28/2022 9:30 A M Medical Record Number: [id number] Patient Account Number: [id number] Date of Birth/Sex: Treating RN: Mar 23, 1976 (46 y.o. [name] Primary Care Provider: [name] Other Clinician: Referring Provider: Treating Provider/Extender: [name] in Treatment: 32 Verbal / Phone Orders: No Diagnosis Coding ICD-10 Coding Code Description [phone] Non-pressure chronic ulcer of other part of left foot with fat layer exposed Follow-up Appointments ppointment in 1 week. - Dr [name] Room 3 Thursday 05/05/22 at 11am; 10/5;@9 :30am;10/12 at 0930;1019 at 0930;1026 at 0930 Return A Anesthetic Wound #2 Left Calcaneus (In clinic) Topical Lidocaine 4% applied to wound bed - In clinic Bathing/ Shower/ Hygiene May shower with protection but do not get wound dressing(s) wet. - Cover with cast protector (can purchase cast protector at [facility] or [facility] ) Edema Control - Lymphedema / SCD /  Other Bilateral Lower Extremities Elevate legs to the level of the heart or above for 30 minutes daily and/or when sitting, a frequency of: - throughout the day Avoid standing for long periods of time. Moisturize legs daily. - right leg and foot every night. Off-Loading Total Contact Cast to Left Lower Extremity - TCC to Left LL Other: - keep pressure off of the bottom of your feet Additional Orders / Instructions Follow Nutritious Diet Non Wound Condition Right Lower Extremity Other Non Wound Condition Orders/Instructions: - Right Heel-Use Opifoam and Kerlix/tape for Non wound on Right Heel Wound Treatment Wound #2 - Calcaneus Wound Laterality: Left Cleanser: Normal Saline (Generic) 1 x Per Week/30 Days Discharge Instructions: Cleanse the wound with Normal Saline prior  to applying a clean dressing using gauze sponges, not tissue or cotton balls. Cleanser: Wound Cleanser 1 x Per Week/30 Days Discharge Instructions: Cleanse the wound with wound cleanser prior to applying a clean dressing using gauze sponges, not tissue or cotton balls. Prim Dressing: Endoform 2x2 in 1 x Per Week/30 Days ary Discharge Instructions: Moisten with saline Secondary Dressing: Optifoam Non-Adhesive Dressing, 4x4 in (Generic) 1 x Per Week/30 Days Discharge Instructions: Apply over primary dressing as directed. Secondary Dressing: Woven Gauze Sponge, Non-Sterile 4x4 in 1 x Per Week/30 Days Discharge Instructions: Apply over primary dressing as directed. Secured With: 70M Medipore H Soft Cloth Surgical T ape, 4 x 10 (in/yd) (Generic) 1 x Per Week/30 Days Discharge Instructions: Secure with tape as directed. Electronic Signature(s) Signed: 04/28/2022 5:27:48 PM By: [name] Signed: 04/29/2022 7:37:30 AM By: Fredirick Maudlin MD FACS Previous Signature: 04/28/2022 10:25:30 AM Version By: Fredirick Maudlin MD FACS Previous Signature: 04/28/2022 10:21:22 AM Version By: Fredirick Maudlin MD FACS Entered By: [name] on 04/28/2022 17:19:29 -------------------------------------------------------------------------------- Problem List Details Patient Name: Date of Service: [name], [place] 04/28/2022 9:30 A M Medical Record Number: [id number] Patient Account Number: [id number] Date of Birth/Sex: Treating RN: 07-04-1976 (46 y.o. M) Primary Care Provider: [name] Other Clinician: Referring Provider: Treating Provider/Extender: [name] in Treatment: 22 Active Problems ICD-10 Encounter Code Description Active Date MDM Diagnosis L97.522 Non-pressure chronic ulcer of other part of left foot with fat layer exposed 09/03/2020 No Yes Inactive Problems ICD-10 Code Description Active Date Inactive Date L97.512 Non-pressure chronic ulcer of other part  of right foot with fat layer exposed 09/03/2020 09/03/2020 L89.893 Pressure ulcer of other site, stage 3 07/15/2020 07/15/2020 M62.81 Muscle weakness (generalized) 07/15/2020 07/15/2020 I10 Essential (primary) hypertension 07/15/2020 07/15/2020 M86.171 Other acute osteomyelitis, right ankle and foot 09/03/2020 09/03/2020 Resolved Problems Electronic Signature(s) Signed: 04/28/2022 10:19:13 AM By: Fredirick Maudlin MD FACS Entered By: Fredirick Maudlin on 04/28/2022 10:19:13 -------------------------------------------------------------------------------- Progress Note Details Patient Name: Date of Service: [name], [place] 04/28/2022 9:30 A M Medical Record Number: [id number] Patient Account Number: [id number] Date of Birth/Sex: Treating RN: September 29, 1975 (46 y.o. M) Primary Care Provider: [name] Other Clinician: Referring Provider: Treating Provider/Extender: [name] in Treatment: 13 Subjective Chief Complaint Information obtained from Patient Bilateral Plantar Foot Ulcers History of Present Illness (HPI) Wounds are12/03/2020 upon evaluation today patient presents for initial inspection here in our clinic concerning issues he has been having with the bottoms of his feet bilaterally. He states these actually occurred as wounds when he was hospitalized for 5 months secondary to Covid. He was apparently with tilting bed where he was in an upright position quite frequently and apparently this occurred in some way shape or  form during that time. Fortunately there is no sign of active infection at this time. No fevers, chills, nausea, vomiting, or diarrhea. With that being said he still has substantial wounds on the plantar aspects of his feet Theragen require quite a bit of work to get these to heal. He has been using Santyl currently though that is been problematic both in receiving the medication as well as actually paid for it as it is become quite expensive. Prior to  the experience with Covid the patient really did not have any major medical problems other than hypertension he does have some mild generalized weakness following the Covid experience. 07/22/2020 on evaluation today patient appears to be doing okay in regard to his foot ulcers I feel like the wound beds are showing signs of better improvement that I do believe the Iodoflex is helping in this regard. With that being said he does have a lot of drainage currently and this is somewhat blue/green in nature which is consistent with Pseudomonas. I do think a culture today would be appropriate for [place] to evaluate and see if that is indeed the case I would likely start him on antibiotic orally as well he is not allergic to Cipro knows of no issues he has had in the past 12/21; patient was admitted to the clinic earlier this month with bilateral presumed pressure ulcers on the bottom of his feet apparently related to excessive pressure from a tilt table arrangement in the intensive care unit. Patient relates this to being on ECMO but I am not really sure that is exactly related to that. I must say I have never seen anything like this. He has fairly extensive full-thickness wounds extending from his heel towards his midfoot mostly centered laterally. There is already been some healing distally. He does not appear to have an arterial issue. He has been using gentamicin to the wound surfaces with Iodoflex to help with ongoing debridement 1/6; this is a patient with pressure ulcers on the bottom of his feet related to excessive pressure from a standing position in the intensive care unit. He is complaining of a lot of pain in the right heel. He is not a diabetic. 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 We have been using Iodoflex to help prepare the surfaces of both wounds for an advanced treatment product. He is nonambulatory spending most of his time in a wheelchair I have asked him not to  propel the wheelchair with his heels 1/13; in general his wounds look better not much surface area change we have been using Iodoflex as of last week. I did an x-ray of the right heel as the patient was complaining of pain. I had some thoughts about a stress fracture perhaps Achilles tendon problems however what it showed was erosive changes along the inferior aspect of the calcaneus he now has a MRI booked for 1/20. 1/20; in general his wounds continue to be better. Some improvement in the large narrow areas proximally in his foot. He is still complaining of pain in the right heel and tenderness in certain areas of this wound. His MRI is tonight. I am not just looking for osteomyelitis that was brought up on the x-ray I am wondering about stress fractures, tendon ruptures etc. He has no such findings on the left. Also noteworthy is that the patient had critical illness neuropathy and some of the discomfort may be actual improvement in nerve function I am just not sure. These  wounds were initially in the setting of severe critical illness related to COVID-19. He was put in a standing position. He may have also been on pressors at the point contributing to tissue ischemia. By his description at some point these wounds were grossly necrotic extending proximally up into the Achilles part of his heel. I do not know that I have ever really seen pictures of them like this although they may exist in epic We have ordered Tri layer Oasis. I am trying to stimulate some granulation in these areas. This is of course assuming the MRI is negative for infection 1/27; since the patient was last here he saw Dr. Juleen China of infectious disease. He is planned for vancomycin and ceftriaxone. Prior operative culture grew MSSA. Also ordered baseline lab work. He also ordered arterial studies although the ABIs in our clinic were normal as well as his clinical exam these were normal I do not think he needs to see vascular  surgery. His ABIs at the [facility] were 1.22 in the right triphasic waveforms with a normal TBI of 1.15 on the left ABI of 1.22 with triphasic waveforms and a normal TBI of 1.08. Finally he saw Dr. Amalia Hailey who will follow him in for 2 months. At this point I do not think he felt that he needed a procedure on the right calcaneal bone. Dr. Juleen China is elected for broad-spectrum antibiotic The patient is still having pain in the right heel. He walks with a walker 2/3; wounds are generally smaller. He is tolerating his IV antibiotics. I believe this is vancomycin and ceftriaxone. We are still waiting for Oasis burn in terms of his out-of-pocket max which he should be meeting soon given the IV antibiotics, MRIs etc. I have asked him to check in on this. We are using silver collagen in the meantime the wounds look better 2/10; tolerating IV vancomycin and Rocephin. We are waiting to apply for Oasis. Although I am not really sure where he is in his out-of-pocket max. 2/17 started the first application of Oasis trilayer. Still on antibiotics. The wounds have generally look better. The area on the left has a little more surface slough requiring debridement [id number]; second application of Oasis trilayer. The wound surface granulation is generally look better. The area on the left with undermining laterally I think is come in a bit. 10/08/2020 upon evaluation today patient is here today for [employer] application #3. Fortunately he seems to be doing extremely well with regard to this and we are seeing a lot of new epithelial growth which is great news. Fortunately there is no signs of active infection at this time. 10/16/2020 upon evaluation today patient appears to be doing well with regard to his foot ulcers. Do believe the Oasis has been of benefit for him. I do not see any signs of infection right now which is great news and I think that he has a lot of new epithelial growth which is great to see as well. The patient  is very pleased to hear all of this. I do think we can proceed with the Oasis trilayer #4 today. 3/18; not as much improvement in these areas on his heels that I was hoping. I did reapply trilateral Oasis today the tissue looks healthier but not as much fill in as I was hoping. 3/25; better looking today I think this is come in a bit the tissue looks healthier. Triple layer Oasis reapplied #6 4/1; somewhat better looking definitely better looking surface not  as much change in surface area as I was hoping. He may be spending more time [place] on days then he needs to although he does have heel offloading boots. Triple layer Oasis reapplied #7 4/7; unfortunately apparently [facility] will not approve any further Oasis which is unfortunate since the patient did respond nicely both in terms of the condition of the wound bed as well as surface area. There is still some drainage coming from the wound but not a lot there does not appear to be any infection 4/15; we have been using Hydrofera Blue. He continues to have nice rims of epithelialization on the right greater than the left. The left the epithelialization is coming from the tip of his heel. There is moderate drainage. In this that concerns me about a total contact cast. There is no evidence of infection 4/29; patient has been using Hydrofera Blue with dressing changes. He has no complaints or issues today. 5/5; using Hydrofera Blue. I actually think that he looks marginally better than the last time I saw this 3 weeks ago. There are rims of epithelialization on the left thumb coming from the medial side on the right. Using Hydrofera Blue 5/12; using Hydrofera Blue. These continue to make improvements in surface area. His drainage was not listed as severe I therefore went ahead and put a cast on the left foot. Right foot we will continue to dress his previous 5/16; back for first total contact cast change. He did not tolerate this  particularly well cast injury on the anterior tibia among other issues. Difficulty sleeping. I talked him about this in some detail and afterwards is elected to continue. I told him I would like to have a cast on for 3 weeks to see if this is going to help at all. I think he agreed 5/19; I think the wound is better. There is no tunneling towards his midfoot. The undermining medially also looks better. He has a rim of new skin distally. I think we are making progress here. The area on the left also continues to look somewhat better to me using Hydrofera Blue. He has a list of complaints about the cast but none of them seem serious 5/26; patient presents for 1 week follow-up. He has been using a total contact cast and tolerating this well. Hydrofera Blue is the main dressing used. He denies signs of infection. 6/2 Hydrofera Blue total contact cast on the left. These were large ulcers that formed in intensive care unit where the patient was recovering from [place]. May have had something to do with being ventilated in an upright positiono Pressors etc. We have been able to get the areas down considerably and a viable surface. There is some epithelialization in both sides. Note made of drainage 6/9; changed to [facility] last time because of drainage. He arrives with better looking surfaces and dimensions on the left than the right. Paradoxically the right actually probes more towards his midfoot the left is largely close down but both of these look improved. Using a total contact cast on the left 6/16; complex wounds on his bilateral plantar heels which were initially pressure injury from a stay in the ICU with COVID. We have been using silver alginate most recently. His dimensions of come in quite dramatically however not recently. We have been putting the left foot in a total contact cast 6/23; complex wounds on the bilateral plantar heels. I been putting the left in the cast paradoxically the  area  on the right is the one that is going towards closure at a faster rate. Quite a bit of drainage on the left. The patient went to see Dr. Amalia Hailey who said he was going to standby for skin grafts. I had actually considered sending him for skin grafts however he would be mandatorily off his feet for a period of weeks to months. I am thinking that the area on the right is going to close on its own the area on the left has been more stubborn even though we have him in a total contact cast 6/30; took him out of a total contact cast last week is the right heel seem to be making better progress than the left where I was placing the cast. We are using silver alginate. Both wounds are smaller right greater than left 7/12; both wounds look as though they are making some progress. We are using silver alginate. Heel offloading boots 7/26; very gradual progress especially on the right. Using silver alginate. He is wearing heel offloading boots 8/18; he continues to close these wounds down very gradually. Using silver alginate. The problem polymen being definitive about this is areas of what appears to be callus around the margins. This is not a 100% of the area but certainly sizable especially on the right 9/1; bilateral plantar feet wounds secondary to prolonged pressure while being ventilated for COVID-19 in an upright position. Essentially pressure ulcers on the bottom of his feet. He is made substantial progress using silver alginate. 9/14; bilateral plantar feet wounds secondary to prolonged pressure. Making progress using silver alginate. 9/29 bilateral plantar feet wounds secondary to prolonged pressure. I changed him to Iodoflex last week. MolecuLight showing reddened blush fluorescence 10/11; patient presents for follow-up. He has no issues or complaints today. He denies signs of infection. He continues to use Iodoflex and antibiotic ointment to the wound beds. 10/27; 2-week follow-up. No evidence of  infection. He has callus and thick dry skin around the wound margins we have been using Iodoflex and Bactroban which was in response to a moderate left MolecuLight reddish blush fluorescence. 11/10; 2-week follow-up. Wound margins again have thick callus however the measurements of the actual wound sites are a lot smaller. Everything looks reasonably healthy here. We have been using Iodoflex He was approved for prime matrix but I have elected to delay this given the improvement in the surface area. Hopefully I will not regret that decision as were getting close to the end of the year in terms of insurance payment 12/8; 2-week follow-up. Wounds are generally smaller in size. These were initially substantial wounds extending into the forefoot all the way into the heel on the bilateral plantar feet. They are now both located on the plantar heel distal aspect both of these have a lot of callus around the wounds I used a #5 curette to remove this on the right and the left also some subcutaneous debris to try and get the wound edges were using Iodoflex. He has heel offloading shoe 12/22; 2-week follow-up. Not really much improvement. He has thick callus around the outer edges of both wounds. I remove this there is some nonviable subcutaneous tissue as well. We have been using Iodoflex. Her intake nurse and myself spontaneously thought of a total contact cast I went back in May. At that time we really were not seeing much of an improvement with a cast although the wound was in a much different situation I would like to retry  this in 2 weeks and I discussed this with the patient 08/12/2021; the patient has had some improvement with the Iodoflex. The the area on the left heel plantar more improved than the right. I had to put him in a total contact cast on the left although I decided to put that off for 2 weeks. I am going to change his primary dressing to silver collagen. I think in both areas he has had some  improvement most of the healing seems to be more proximal in the heel. The wounds are in the mid aspect. A lot of thick callus on the right heel however. 1/19; we are using silver collagen on both plantar heel areas. He has had some improvement today. The left did not require any debridement. He still had some eschar on the right that was debrided but both seem to have contracted. I did not put it total contact cast on him today 2/2 we have been using silver collagen. The area on the right plantar heel has areas that appear to be epithelialized interspersed with dry flaking callus and dry skin. I removed this. This really looks better than on the other side. On the left still a large area with raised edges and debris on the surface. 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 He has no issues or complaints today. 2/9; not much change in the left foot wound with 1 week of a cast we are using silver collagen. Silver collagen on the right side. The right side has been the better wound surface. We will reapply the total contact cast on the left 2/16; not much improvement on either side I been using silver collagen with a total contact cast on the left. I'm changing the Hydrofera Blue still with a total contact cast on the left 2/23; some improvement on both sides. Disappointing that he has thick callus around the area that we are putting in a total contact cast on the left. We've been using Hydrofera Blue on both wound areas. This is a man who at essentially pressure ulcers in addition to ischemia caused by medications to support his blood pressure (pressors) in the ICU. He was being ventilated in the standing position for severe Covid. A Shiley the wounds extended across his entire foot but are now localized to his plantar heels bilaterally. We have made progress however  neither areas healed. I continue to think the total contact cast is helped albeit painstakingly slowly. He has never wanted a plastic surgery consult although I don't know that they would be interested in grafting in area in this location. 10/07/2021: Continued improvement bilaterally. There is still some callus around the left wound, despite the total contact cast. He has some increased pain in his right midfoot around 1 particular area. This has been painful in the past but seems to be a little bit worse. When his cast was removed today, there was an area on the heel of the left foot that looks a bit macerated. He is also complaining of pain in his left thigh and hip which he thinks is secondary to the limb length discrepancy caused by the cast. 10/14/2021: He continues to improve. A little bit less callus around the left wound. He continues to endorse pain in his right midfoot, but this is not as significant as it was last week. The maceration on  his left heel is improved. 10/21/2021: Continued improvement to both wounds. The maceration on his left heel is no longer evident. Less callus bilaterally. Epithelialization progressing. 10/28/2021: Significant improvement this week. The right sided wound is nearly closed with just a small open area at the middle. No maceration seen on the left heel. Continued epithelialization on both sides. No concern for infection. 11/04/2021: T oday, the wounds were measured a little bit differently and come out as larger, but I actually think they are about the same to potentially even smaller, particularly on the left. He continues to accumulate some callus on the right. 11/11/2021: T oday, the patient is expressing some concern that the left wound, despite being in the total contact cast, is not progressing at the same rate as the 1 on the right. He is interested in trying a week without the cast to see how the wound does. The wounds are roughly the same size as last week,  with the right perhaps being a little bit smaller. He continues to build up callus on both sites. 11/18/2021: Last week, I permitted the patient to go without his total contact cast, just to see if the cast was really making any difference. Today, both wounds have deteriorated to some extent, suggesting that the cast is providing benefit, at least on the left. Both are larger and have accumulated callus, slough, and other debris. 11/26/2021: I debrided both wounds quite aggressively last week in an effort to stimulate the healing cascade. This appears to have been effective as the left sided wound is a full centimeter shorter in length. Although the right was measured slightly larger, on inspection, it looks as though an area of epithelialized tissue was included in the measurements. We have been using PolyMem Ag on the wound surfaces with a total contact cast to the left. 12/02/2021: It appears that the intake personnel are including epithelialized tissue in his wound measurements; the right wound is almost completely epithelialized; there is just a crater at the proximal midfoot with some open areas. On the left, he has built up some callus, but the overall wound surface looks good. There is some senescent skin around the wound margin. He has been in PolyMem Ag bilaterally with a total contact cast on the left. 12/09/2021: The right wound is nearly closed; there is just a small open area at the mid calcaneus. On the left, the wound is smaller with minimal callus buildup. No significant drainage. 12/16/2021: The right calcaneal wound remains minimally open at the mid calcaneus; the rest has epithelialized. On the left, the wound is also a little bit smaller. There is some senescent tissue on the periphery. He is getting his first application of a trial skin substitute called Vendaje today. 12/23/2021: The wound on his right calcaneus is nearly closed; there is just a small area at the most distal aspect of  the calcaneus that is open. On the left, the area where we applied to the skin substitute has a healthier-looking bed of granulation tissue. The wound dimensions are not significantly different on this side but the wound surface is improved. 12/30/2021: The wound on the right calcaneus has not changed significantly aside from some accumulation of callus. On the left, the open area is smaller and continues to have an improved surface. He continues to accumulate callus around the wound. He is here for his third application of Vendaje. 01/06/2022: The right calcaneal wound is down to just a couple of millimeters. He continues to accumulate  periwound callus. He unfortunately got his cast wet earlier in the week and his left foot is macerated, resulting in some superficial skin loss just distal to the open wound. The open wound itself, however, is much smaller and has a healthier appearing surface. He is here for his fourth application of Vendaje. 01/13/2022: The right calcaneal wound is about the same. Unfortunately, once again, his cast got wet and his foot is again macerated. This is caused the left calcaneal wound to enlarge. He is here for his fifth application of Vendaje. 01/20/2022: The right calcaneal wound is very small. There is some periwound callus accumulation. He purchased a new cast protector last week and this has been effective in avoiding the maceration that has been occurring on the left. The left calcaneal wound is narrower and has a healthy and viable-appearing surface. He is here for his 6 application of Vendaje. 01/27/2022: The right calcaneal wound is down to just a pinhole. There is some periwound slough and callus. On the left, the wound is narrower and shorter by about a centimeter. The surface is robust and viable-appearing. Unfortunately, the rep for the trial skin substitute product did not provide any for [place] to use today. 02/04/2022: The right calcaneal wound remains unchanged.  There is more accumulated callus. On the left, although the intake nurse measured it a little bit longer, it looks about the same to me. The surface has a layer of slough, but underneath this, there is good granulation tissue. 02/10/2022: The right calcaneus wound is nearly closed. There is still some callus that builds up around the site. The left side looks about the same in terms of dimensions, but the surface is more robust and vital-appearing. 02/16/2022: The area of the right calcaneus that was nearly closed last week has closed, but there is a small opening at the mid foot where it looks like some moisture got retained and caused some reopening. The left foot wound is narrower and shallower. Both sites have a fair amount of periwound callus and eschar. 02/24/2022: The small midfoot opening on the right calcaneus is a little bit smaller today. The left foot wound is narrower and shallower. He continues to accumulate periwound callus. No concern for infection. 03/01/2022: The patient came to clinic early because he showered and got his cast wet. Fortunately, there is no significant maceration to his foot but the callus softened and it looks like the wound on his left calcaneus may be a little bit wider. The wound on his right calcaneus is just a narrow slit. Continued accumulation of periwound callus bilaterally. 03/08/2022: The wound on his right calcaneus is very nearly closed, just a small pinpoint opening under a bit of eschar; the left wound has come in quite a bit, as well. It is narrower and shorter than at our last visit. Still with accumulated callus and eschar bilaterally. 03/17/2022: The right calcaneal wound is healed. The left wound is smaller and the surface itself is very clean, but there is some blue-green staining on the periwound callus, concerning for Pseudomonas aeruginosa. 03/23/2022: The right calcaneal wound remains closed. The left wound continues to contract. No further blue-green  staining. Small amount of callus and slough accumulation. 03/28/2022: He came in early today because he had gotten his cast wet. On inspection, the wound itself did not get wet or macerated, just a little bit of the forefoot. The wound itself is basically unchanged. 04/07/2022: The right foot wound remains closed. The left wound is the  smallest that I have seen it to date. It is narrower and shorter. It still continues to accumulate slough on the surface. 04/15/2022: There is a band of epithelium now dividing the small left plantar foot wound in 2. There is still some slough on the surface. 04/21/2022: The wound continues to narrow. Just a little bit of slough on the surface. He seems to be responding well to endoform. 04/28/2022: Continued slow contraction of the wound. There is a little slough on the surface and some periwound callus. We have been using endoform and total contact cast. Patient History Information obtained from Patient. Family History Cancer - Maternal Grandparents, Diabetes - Father,Paternal Grandparents, Heart Disease - Maternal Grandparents, Hypertension - Father,Paternal Grandparents, Lung Disease - Siblings, No family history of Hereditary Spherocytosis, Kidney Disease, Seizures, Stroke, Thyroid Problems, Tuberculosis. Social History Never smoker, Marital Status - Married, Alcohol Use - Never, Drug Use - No History, Caffeine Use - Daily - tea, soda. Medical History Eyes Denies history of Cataracts, Glaucoma, Optic Neuritis Ear/Nose/Mouth/Throat Denies history of Chronic sinus problems/congestion, Middle ear problems Hematologic/Lymphatic Denies history of Anemia, Hemophilia, Human Immunodeficiency Virus, Lymphedema, Sickle Cell Disease Respiratory Patient has history of Asthma Denies history of Aspiration, Chronic Obstructive Pulmonary Disease (COPD), Pneumothorax, Sleep Apnea, Tuberculosis Cardiovascular Patient has history of Angina - with COVID, Hypertension Denies  history of Arrhythmia, Congestive Heart Failure, Coronary Artery Disease, Deep Vein Thrombosis, Hypotension, Myocardial Infarction, Peripheral Arterial Disease, Peripheral Venous Disease, Phlebitis, Vasculitis Gastrointestinal Denies history of Cirrhosis , Colitis, Crohnoos, Hepatitis A, Hepatitis B, Hepatitis C Endocrine Denies history of Type I Diabetes, Type II Diabetes Genitourinary Denies history of End Stage Renal Disease Immunological Denies history of Lupus Erythematosus, Raynaudoos, Scleroderma Integumentary (Skin) Denies history of History of Burn Musculoskeletal Denies history of Gout, Rheumatoid Arthritis, Osteoarthritis, Osteomyelitis Neurologic Denies history of Dementia, Neuropathy, Quadriplegia, Paraplegia, Seizure Disorder Oncologic Denies history of Received Chemotherapy, Received Radiation Psychiatric Denies history of Anorexia/bulimia, Confinement Anxiety Hospitalization/Surgery History - COVID PNA 07/22/2019- 11/14/2019. - 03/27/2020 wound debridement/ skin graft. Medical A Surgical History Notes nd Constitutional Symptoms (General Health) COVID PNA 07/22/2019-11/14/2019 [facility] ECMO, foot drop left foot , Genitourinary kidney stone Psychiatric anxiety Objective Constitutional Slightly hypertensive. No acute distress.. Vitals Time Taken: 9:37 AM, Height: 69 in, Weight: 280 lbs, BMI: 41.3, Temperature: 98.5 F, Pulse: 86 bpm, Respiratory Rate: 18 breaths/min, Blood Pressure: 143/84 mmHg. Respiratory Normal work of breathing on room air.. General Notes: 04/28/2022: Continued slow contraction of the wound. There is a little slough on the surface and some periwound callus. Integumentary (Hair, Skin) Wound #2 status is Open. Original cause of wound was Pressure Injury. The date acquired was: 10/07/2019. The wound has been in treatment 93 weeks. The wound is located on the Left Calcaneus. The wound measures 0.4cm length x 0.3cm width x 0.2cm depth; 0.094cm^2 area and  0.019cm^3 volume. There is Fat Layer (Subcutaneous Tissue) exposed. There is no tunneling or undermining noted. There is a medium amount of serosanguineous drainage noted. The wound margin is thickened. There is small (1-33%) red granulation within the wound bed. There is a large (67-100%) amount of necrotic tissue within the wound bed including Adherent Slough. Assessment Active Problems ICD-10 Non-pressure chronic ulcer of other part of left foot with fat layer exposed Procedures Wound #2 Pre-procedure diagnosis of Wound #2 is a Pressure Ulcer located on the Left Calcaneus . There was a T Contact Cast Procedure by Fredirick Maudlin, MD. otal Post procedure Diagnosis Wound #2: Same as Pre-Procedure Plan Follow-up Appointments: Return  Appointment in 1 week. - Dr [name] Room 3 Thursday 05/05/22 at 11am; 10/5;@9 :30am;10/12 at 0930;1019 at 0930;1026 at 0930 Anesthetic: Wound #2 Left Calcaneus: (In clinic) Topical Lidocaine 4% applied to wound bed - In clinic Bathing/ Shower/ Hygiene: May shower with protection but do not get wound dressing(s) wet. - Cover with cast protector (can purchase cast protector at [facility] or [facility] ) Edema Control - Lymphedema / SCD / Other: Elevate legs to the level of the heart or above for 30 minutes daily and/or when sitting, a frequency of: - throughout the day Avoid standing for long periods of time. Moisturize legs daily. - right leg and foot every night. Off-Loading: T Contact Cast to Left Lower Extremity - TCC to Left LL otal Other: - keep pressure off of the bottom of your feet Additional Orders / Instructions: Follow Nutritious Diet Non Wound Condition: Other Non Wound Condition Orders/Instructions: - Right Heel-Use Opifoam and Kerlix/tape for Non wound on Right Heel WOUND #2: - Calcaneus Wound Laterality: Left Cleanser: Normal Saline (Generic) 1 x Per Week/30 Days Discharge Instructions: Cleanse the wound with Normal Saline prior to applying a clean  dressing using gauze sponges, not tissue or cotton balls. Cleanser: Wound Cleanser 1 x Per Week/30 Days Discharge Instructions: Cleanse the wound with wound cleanser prior to applying a clean dressing using gauze sponges, not tissue or cotton balls. Prim Dressing: Endoform 2x2 in 1 x Per Week/30 Days ary Discharge Instructions: Moisten with saline Secondary Dressing: Optifoam Non-Adhesive Dressing, 4x4 in (Generic) 1 x Per Week/30 Days Discharge Instructions: Apply over primary dressing as directed. Secondary Dressing: Woven Gauze Sponge, Non-Sterile 4x4 in 1 x Per Week/30 Days Discharge Instructions: Apply over primary dressing as directed. Secured With: 46M Medipore H Soft Cloth Surgical T ape, 4 x 10 (in/yd) (Generic) 1 x Per Week/30 Days Discharge Instructions: Secure with tape as directed. 04/28/2022: Continued slow contraction of the wound. There is a little slough on the surface and some periwound callus. I cleaned off the callus and slough in order to prepare the site for endoform placement. A total contact cast was then applied. Follow-up in 1 week. Electronic Signature(s) Signed: 04/28/2022 5:27:48 PM By: [name] Signed: 04/29/2022 7:37:30 AM By: Fredirick Maudlin MD FACS Previous Signature: 04/28/2022 10:23:10 AM Version By: Fredirick Maudlin MD FACS Previous Signature: 04/28/2022 10:22:06 AM Version By: Fredirick Maudlin MD FACS Entered By: [name] on 04/28/2022 17:21:09 -------------------------------------------------------------------------------- HxROS Details Patient Name: Date of Service: [name], [place] 04/28/2022 9:30 A M Medical Record Number: [id number] Patient Account Number: [id number] Date of Birth/Sex: Treating RN: Nov 14, 1975 (46 y.o. M) Primary Care Provider: [name] Other Clinician: Referring Provider: Treating Provider/Extender: [name] in Treatment: 4 Information Obtained From Patient Constitutional Symptoms  (General Health) Medical History: Past Medical History Notes: COVID PNA 07/22/2019-11/14/2019 [facility] ECMO, foot drop left foot , Eyes Medical History: Negative for: Cataracts; Glaucoma; Optic Neuritis Ear/Nose/Mouth/Throat Medical History: Negative for: Chronic sinus problems/congestion; Middle ear problems Hematologic/Lymphatic Medical History: Negative for: Anemia; Hemophilia; Human Immunodeficiency Virus; Lymphedema; Sickle Cell Disease Respiratory Medical History: Positive for: Asthma Negative for: Aspiration; Chronic Obstructive Pulmonary Disease (COPD); Pneumothorax; Sleep Apnea; Tuberculosis Cardiovascular Medical History: Positive for: Angina - with COVID; Hypertension Negative for: Arrhythmia; Congestive Heart Failure; Coronary Artery Disease; Deep Vein Thrombosis; Hypotension; Myocardial Infarction; Peripheral Arterial Disease; Peripheral Venous Disease; Phlebitis; Vasculitis Gastrointestinal Medical History: Negative for: Cirrhosis ; Colitis; Crohns; Hepatitis A; Hepatitis B; Hepatitis C Endocrine Medical History: Negative for: Type I Diabetes;  Type II Diabetes Genitourinary Medical History: Negative for: End Stage Renal Disease Past Medical History Notes: kidney stone Immunological Medical History: Negative for: Lupus Erythematosus; Raynauds; Scleroderma Integumentary (Skin) Medical History: Negative for: History of Burn Musculoskeletal Medical History: Negative for: Gout; Rheumatoid Arthritis; Osteoarthritis; Osteomyelitis Neurologic Medical History: Negative for: Dementia; Neuropathy; Quadriplegia; Paraplegia; Seizure Disorder Oncologic Medical History: Negative for: Received Chemotherapy; Received Radiation Psychiatric Medical History: Negative for: Anorexia/bulimia; Confinement Anxiety Past Medical History Notes: anxiety Immunizations Pneumococcal Vaccine: Received Pneumococcal Vaccination: No Implantable Devices None Hospitalization / Surgery  History Type of Hospitalization/Surgery COVID PNA 07/22/2019- 11/14/2019 03/27/2020 wound debridement/ skin graft Family and Social History Cancer: Yes - Maternal Grandparents; Diabetes: Yes - Father,Paternal Grandparents; Heart Disease: Yes - Maternal Grandparents; Hereditary Spherocytosis: No; Hypertension: Yes - Father,Paternal Grandparents; Kidney Disease: No; Lung Disease: Yes - Siblings; Seizures: No; Stroke: No; Thyroid Problems: No; Tuberculosis: No; Never smoker; Marital Status - Married; Alcohol Use: Never; Drug Use: No History; Caffeine Use: Daily - tea, soda; Financial Concerns: No; Food, Clothing or Shelter Needs: No; Support System Lacking: No; Transportation Concerns: No Electronic Signature(s) Signed: 04/28/2022 10:25:30 AM By: Fredirick Maudlin MD FACS Entered By: Fredirick Maudlin on 04/28/2022 10:20:45 -------------------------------------------------------------------------------- Total Contact Cast Details Patient Name: Date of Service: [name], [place] 04/28/2022 9:30 A M Medical Record Number: [id number] Patient Account Number: [id number] Date of Birth/Sex: Treating RN: 09-03-1975 (46 y.o. M) Primary Care Provider: [name] Other Clinician: Referring Provider: Treating Provider/Extender: [name] in Treatment: 48 T Contact Cast Applied for Wound Assessment: otal Wound #2 Left Calcaneus Performed By: Physician Fredirick Maudlin, MD Post Procedure Diagnosis Same as Pre-procedure Electronic Signature(s) Signed: 04/28/2022 10:22:38 AM By: Fredirick Maudlin MD FACS Entered By: Fredirick Maudlin on 04/28/2022 10:22:38 -------------------------------------------------------------------------------- SuperBill Details Patient Name: Date of Service: [name], [name] 04/28/2022 Medical Record Number: [id number] Patient Account Number: [id number] Date of Birth/Sex: Treating RN: 08-31-1975 (46 y.o. M) Primary Care Provider: [name] Other  Clinician: Referring Provider: Treating Provider/Extender: [name] in Treatment: 56 Diagnosis Coding ICD-10 Codes Code Description [phone] Non-pressure chronic ulcer of other part of left foot with fat layer exposed Facility Procedures CPT4 Code: [id number] Description: [phone] - APPLY TOTAL CONTACT LEG CAST ICD-10 Diagnosis Description L97.522 Non-pressure chronic ulcer of other part of left foot with fat layer exposed Modifier: Quantity: 1 Physician Procedures : CPT4 Code Description Modifier [id number] - WC PHYS LEVEL 3 - EST PT ICD-10 Diagnosis Description L97.522 Non-pressure chronic ulcer of other part of left foot with fat layer exposed Quantity: 1 : [id number] - WC PHYS APPLY TOTAL CONTACT CAST ICD-10 Diagnosis Description L97.522 Non-pressure chronic ulcer of other part of left foot with fat layer exposed Quantity: 1 Electronic Signature(s) Signed: 04/28/2022 10:23:28 AM By: Fredirick Maudlin MD FACS Entered By: Fredirick Maudlin on 04/28/2022 10:23:28

## 2022-05-05 ENCOUNTER — Encounter (HOSPITAL_BASED_OUTPATIENT_CLINIC_OR_DEPARTMENT_OTHER): Payer: PPO | Admitting: General Surgery

## 2022-05-05 DIAGNOSIS — L89623 Pressure ulcer of left heel, stage 3: Secondary | ICD-10-CM | POA: Diagnosis not present

## 2022-05-05 DIAGNOSIS — L97422 Non-pressure chronic ulcer of left heel and midfoot with fat layer exposed: Secondary | ICD-10-CM | POA: Diagnosis not present

## 2022-05-05 NOTE — Progress Notes (Signed)
[name], [name] ([name]) Visit Report for 05/05/2022 Arrival Information Details Patient Name: Date of Service: [name], [place] 05/05/2022 11:00 A M Medical Record Number: [name] Patient Account Number: [id number] Date of Birth/Sex: Treating RN: 05-22-1976 (46 y.o. [name] Primary Care [name]: [name] Other Clinician: Referring [name]: Treating [name]/Extender: [name] in Treatment: 66 Visit Information History Since Last Visit Added or deleted any medications: No Patient Arrived: Wheel Chair Any new allergies or adverse reactions: No Arrival Time: 11:20 Had a fall or experienced change in No Accompanied By: aunt activities of daily living that may affect Transfer Assistance: None risk of falls: Patient Identification Verified: Yes Signs or symptoms of abuse/neglect since last visito No Patient Requires Transmission-Based Precautions: No Hospitalized since last visit: No Patient Has Alerts: No Implantable device outside of the clinic excluding No cellular tissue based products placed in the center since last visit: Has Dressing in Place as Prescribed: Yes Pain Present Now: Yes Electronic Signature(s) Signed: 05/05/2022 5:48:04 PM By: [name] Catholic RN Entered By: [name] on 05/05/2022 11:50:04 -------------------------------------------------------------------------------- Encounter Discharge Information Details Patient Name: Date of Service: [name], [place]. 05/05/2022 11:00 A M Medical Record Number: [name] Patient Account Number: [id number] Date of Birth/Sex: Treating RN: 02-21-1976 (46 y.o. [name] Primary Care [name]: [name] Other Clinician: Referring Brena Windsor: Treating [name]/Extender: [name] in Treatment: 19 Encounter Discharge Information Items Discharge Condition: Stable Ambulatory Status: Wheelchair Discharge Destination: Home Transportation:  Private Auto Accompanied By: aunt Schedule Follow-up Appointment: Yes Clinical Summary of Care: Patient Declined Electronic Signature(s) Signed: 05/05/2022 5:48:04 PM By: [name] Catholic RN Entered By: [name] on 05/05/2022 17:38:27 -------------------------------------------------------------------------------- Lower Extremity Assessment Details Patient Name: Date of Service: [name], [place] 05/05/2022 11:00 A M Medical Record Number: [name] Patient Account Number: [id number] Date of Birth/Sex: Treating RN: 1975-11-16 (46 y.o. [name] Primary Care Pamula Luther: [name] Other Clinician: Referring [name]: Treating Chene Kasinger/Extender: [name] in Treatment: 94 Edema Assessment Assessed: [name]: No] [Right: No] Edema: [Left: N] [Right: o] Calf Left: Right: Point of Measurement: 29 cm From Medial Instep 39.8 cm 39 cm Ankle Left: Right: Point of Measurement: 9 cm From Medial Instep 25.4 cm 25 cm Electronic Signature(s) Signed: 05/05/2022 5:48:04 PM By: [name] Catholic RN Entered By: [name] on 05/05/2022 17:37:01 -------------------------------------------------------------------------------- Multi Wound Chart Details Patient Name: Date of Service: [name], [place] 05/05/2022 11:00 A M Medical Record Number: [name] Patient Account Number: [id number] Date of Birth/Sex: Treating RN: [birth year] (46 y.o. M) Primary Care [name]: [name] Other Clinician: Referring [name]: Treating Talma Aguillard/Extender: [name] in Treatment: 38 Photos: [N/A:N/A] Left Calcaneus N/A N/A Wound Location: Pressure Injury N/A N/A Wounding Event: Pressure Ulcer N/A N/A Primary Etiology: Asthma, Angina, Hypertension N/A N/A Comorbid History: 10/07/2019 N/A N/A Date Acquired: 70 N/A N/A Weeks of Treatment: Open N/A N/A Wound Status: No N/A N/A Wound Recurrence: 0.4x0.2x0.2 N/A N/A Measurements L x W x  D (cm) 0.063 N/A N/A A (cm) : rea 0.013 N/A N/A Volume (cm) : 99.80% N/A N/A % Reduction in A rea: 100.00% N/A N/A % Reduction in Volume: Category/Stage III N/A N/A Classification: Medium N/A N/A Exudate A mount: Serosanguineous N/A N/A Exudate Type: red, brown N/A N/A Exudate Color: Thickened N/A N/A Wound Margin: Small (1-33%) N/A N/A Granulation Amount: Red N/A N/A Granulation Quality: Large (67-100%) N/A N/A Necrotic Amount: Eschar, Adherent Slough N/A N/A Necrotic Tissue:  Fat Layer (Subcutaneous Tissue): Yes N/A N/A Exposed Structures: Fascia: No Tendon: No Muscle: No Joint: No Bone: No Small (1-33%) N/A N/A Epithelialization: Debridement - Excisional N/A N/A Debridement: Pre-procedure Verification/Time Out 11:45 N/A N/A Taken: Lidocaine 4% Topical Solution N/A N/A Pain Control: Necrotic/Eschar, Subcutaneous, N/A N/A Tissue Debrided: Slough Skin/Subcutaneous Tissue N/A N/A Level: 0.08 N/A N/A Debridement A (sq cm): rea Curette N/A N/A Instrument: Minimum N/A N/A Bleeding: Pressure N/A N/A Hemostasis Achieved: 0 N/A N/A Procedural Pain: 0 N/A N/A Post Procedural Pain: Debridement Treatment Response: Procedure was tolerated well N/A N/A Post Debridement Measurements L x 0.4x0.2x0.2 N/A N/A W x D (cm) 0.013 N/A N/A Post Debridement Volume: (cm) Category/Stage III N/A N/A Post Debridement Stage: Debridement N/A N/A Procedures Performed: Treatment Notes Electronic Signature(s) Signed: 05/05/2022 11:53:52 AM By: Fredirick Maudlin MD FACS Entered By: Fredirick [name] on 05/05/2022 11:53:52 -------------------------------------------------------------------------------- Multi-Disciplinary Care Plan Details Patient Name: Date of Service: [name], [place]. 05/05/2022 11:00 A M Medical Record Number: [id number] Patient Account Number: [id number] Date of Birth/Sex: Treating RN: 02/06/1976 (46 y.o. [name] Primary Care Camren Henthorn:  [name] Other Clinician: Referring Aurora Rody: Treating Verdella Laidlaw/Extender: [name] in Treatment: 92 Multidisciplinary Care Plan reviewed with physician Active Inactive Wound/Skin Impairment Nursing Diagnoses: Impaired tissue integrity Knowledge deficit related to ulceration/compromised skin integrity Goals: Patient/caregiver will verbalize understanding of skin care regimen Date Initiated: 07/15/2020 Target Resolution Date: 07/07/2022 Goal Status: Active Ulcer/skin breakdown will have a volume reduction of 30% by week 4 Date Initiated: 07/15/2020 Date Inactivated: 08/20/2020 Target Resolution Date: 09/03/2020 Goal Status: Unmet Unmet Reason: no major changes. Ulcer/skin breakdown will heal within 14 weeks Date Initiated: 12/04/2020 Date Inactivated: 12/10/2020 Target Resolution Date: 12/10/2020 Unmet Reason: wounds still open at 14 Goal Status: Unmet weeks and today 21 weeks. Interventions: Assess patient/caregiver ability to obtain necessary supplies Assess patient/caregiver ability to perform ulcer/skin care regimen upon admission and as needed Assess ulceration(s) every visit Provide education on ulcer and skin care Treatment Activities: Skin care regimen initiated : 07/15/2020 Topical wound management initiated : 07/15/2020 Notes: Electronic Signature(s) Signed: 05/05/2022 5:48:04 PM By: [name] Catholic RN Entered By: [name] on 05/05/2022 17:37:29 -------------------------------------------------------------------------------- Pain Assessment Details Patient Name: Date of Service: [name], [place] 05/05/2022 11:00 A M Medical Record Number: [id number] Patient Account Number: [id number] Date of Birth/Sex: Treating RN: 10/29/1975 (46 y.o. [name] Primary Care Aziya Arena: [name] Other Clinician: Referring Bennett Ram: Treating Abel Ra/Extender: [name] in Treatment: 94 Active Problems Location of  Pain Severity and Description of Pain Patient Has Paino Yes Site Locations Pain Location: Generalized Pain With Dressing Change: No Duration of the Pain. Constant / Intermittento Constant Rate the pain. Current Pain Level: 4 Worst Pain Level: 10 Least Pain Level: 3 Tolerable Pain Level: 3 Character of Pain Describe the Pain: Difficult to Pinpoint Pain Management and Medication Current Pain Management: Medication: No Cold Application: No Rest: No Massage: No Activity: No T.E.N.S.: No Heat Application: No Leg drop or elevation: No Is the Current Pain Management Adequate: Adequate How does your wound impact your activities of daily livingo Sleep: No Bathing: No Appetite: No Relationship With Others: No Bladder Continence: No Emotions: No Bowel Continence: No Work: No Toileting: No Drive: No Dressing: No Hobbies: No Electronic Signature(s) Signed: 05/05/2022 5:48:04 PM By: [name] Catholic RN Entered By: [name] on 05/05/2022 17:36:52 -------------------------------------------------------------------------------- Patient/Caregiver Education Details Patient Name: Date of Service: [name], [name] 9/28/2023andnbsp11:00 A M Medical Record  Number: [id number] Patient Account Number: [id number] Date of Birth/Gender: Treating RN: 1976-01-01 (46 y.o. [name] Primary Care Physician: [name] Other Clinician: Referring Physician: Treating Physician/Extender: [name] in Treatment: 69 Education Assessment Education Provided To: Patient Education Topics Provided Wound/Skin Impairment: Methods: Explain/Verbal Responses: Return demonstration correctly Electronic Signature(s) Signed: 05/05/2022 5:48:04 PM By: [name] Catholic RN Entered By: [name] on 05/05/2022 17:37:46 -------------------------------------------------------------------------------- Wound Assessment Details Patient Name: Date of Service: [name], [place] 05/05/2022 11:00 A M Medical Record Number: [id number] Patient Account Number: [id number] Date of Birth/Sex: Treating RN: [birth year] (46 y.o. [name] Primary Care Ji Fairburn: [name] Other Clinician: Referring Cordney Barstow: Treating Juline Sanderford/Extender: [name] in Treatment: 19 Wound Status Wound Number: 2 Primary Etiology: Pressure Ulcer Wound Location: Left Calcaneus Wound Status: Open Wounding Event: Pressure Injury Comorbid History: Asthma, Angina, Hypertension Date Acquired: 10/07/2019 Weeks Of Treatment: 94 Clustered Wound: No Photos Wound Measurements Length: (cm) 0.4 Width: (cm) 0.2 Depth: (cm) 0.2 Area: (cm) 0.063 Volume: (cm) 0.013 % Reduction in Area: 99.8% % Reduction in Volume: 100% Epithelialization: Small (1-33%) Tunneling: No Undermining: No Wound Description Classification: Category/Stage III Wound Margin: Thickened Exudate Amount: Medium Exudate Type: Serosanguineous Exudate Color: red, brown Foul Odor After Cleansing: No Slough/Fibrino No Wound Bed Granulation Amount: Small (1-33%) Exposed Structure Granulation Quality: Red Fascia Exposed: No Necrotic Amount: Large (67-100%) Fat Layer (Subcutaneous Tissue) Exposed: Yes Necrotic Quality: Eschar, Adherent Slough Tendon Exposed: No Muscle Exposed: No Joint Exposed: No Bone Exposed: No Treatment Notes Wound #2 (Calcaneus) Wound Laterality: Left Cleanser Normal Saline Discharge Instruction: Cleanse the wound with Normal Saline prior to applying a clean dressing using gauze sponges, not tissue or cotton balls. Wound Cleanser Discharge Instruction: Cleanse the wound with wound cleanser prior to applying a clean dressing using gauze sponges, not tissue or cotton balls. Peri-Wound Care Topical Primary Dressing Endoform 2x2 in Discharge Instruction: Moisten with saline Secondary Dressing Optifoam Non-Adhesive Dressing, 4x4 in Discharge  Instruction: Apply over primary dressing as directed. Woven Gauze Sponge, Non-Sterile 4x4 in Discharge Instruction: Apply over primary dressing as directed. Secured With 71M Medipore H Soft Cloth Surgical T ape, 4 x 10 (in/yd) Discharge Instruction: Secure with tape as directed. Compression Wrap Compression Stockings Add-Ons Electronic Signature(s) Signed: 05/05/2022 5:48:04 PM By: [name] Catholic RN Entered By: [name] on 05/05/2022 11:43:44 -------------------------------------------------------------------------------- Vitals Details Patient Name: Date of Service: [name], [place] 05/05/2022 11:00 A M Medical Record Number: [id number] Patient Account Number: [id number] Date of Birth/Sex: Treating RN: [birth year] (46 y.o. [name] Primary Care Sherita Decoste: [name] Other Clinician: Referring Mayela Bullard: Treating [place] Tapp/Extender: [name] in Treatment: 66 Vital Signs Time Taken: 11:20 Temperature (F): 98.3 Height (in): 69 Pulse (bpm): 85 Weight (lbs): 280 Respiratory Rate (breaths/min): 18 Body Mass Index (BMI): 41.3 Blood Pressure (mmHg): 135/83 Reference Range: 80 - 120 mg / dl Electronic Signature(s) Signed: 05/05/2022 5:48:04 PM By: [name] Catholic RN Entered By: [name] on 05/05/2022 17:36:10

## 2022-05-05 NOTE — Progress Notes (Signed)
NABOR, NEESON ([name]) Visit Report for 05/05/2022 Chief Complaint Document Details Patient Name: Date of Service: [name], [name] 05/05/2022 11:00 A M Medical Record Number: [name] Patient Account Number: [id number] Date of Birth/Sex: Treating RN: 10/25/1975 (46 y.o. M) Primary Care Provider: Cristie [name] Other Clinician: Referring Provider: Treating Provider/Extender: [name] in Treatment: 59 Information Obtained from: Patient Chief Complaint Bilateral Plantar Foot Ulcers Electronic Signature(s) Signed: 05/05/2022 11:54:00 AM By: [name] Maudlin MD FACS Entered By: [name] on 05/05/2022 11:54:00 -------------------------------------------------------------------------------- HPI Details Patient Name: Date of Service: [name], [place]. 05/05/2022 11:00 A M Medical Record Number: [name] Patient Account Number: [id number] Date of Birth/Sex: Treating RN: 1975/12/03 (46 y.o. M) Primary Care Provider: Cristie [name] Other Clinician: Referring Provider: Treating Provider/Extender: [name] in Treatment: 54 History of Present Illness HPI Description: Wounds are12/03/2020 upon evaluation today patient presents for initial inspection here in our clinic concerning issues he has been having with the bottoms of his feet bilaterally. He states these actually occurred as wounds when he was hospitalized for 5 months secondary to Covid. He was apparently with tilting bed where he was in an upright position quite frequently and apparently this occurred in some way shape or form during that time. Fortunately there is no sign of active infection at this time. No fevers, chills, nausea, vomiting, or diarrhea. With that being said he still has substantial wounds on the plantar aspects of his feet Theragen require quite a bit of work to get these to heal. He has been using Santyl currently though that is been problematic both in  receiving the medication as well as actually paid for it as it is become quite expensive. Prior to the experience with Covid the patient really did not have any major medical problems other than hypertension he does have some mild generalized weakness following the Covid experience. 07/22/2020 on evaluation today patient appears to be doing okay in regard to his foot ulcers I feel like the wound beds are showing signs of better improvement that I do believe the Iodoflex is helping in this regard. With that being said he does have a lot of drainage currently and this is somewhat blue/green in nature which is consistent with Pseudomonas. I do think a culture today would be appropriate for [place] to evaluate and see if that is indeed the case I would likely start him on antibiotic orally as well he is not allergic to Cipro knows of no issues he has had in the past 12/21; patient was admitted to the clinic earlier this month with bilateral presumed pressure ulcers on the bottom of his feet apparently related to excessive pressure from a tilt table arrangement in the intensive care unit. Patient relates this to being on ECMO but I am not really sure that is exactly related to that. I must say I have never seen anything like this. He has fairly extensive full-thickness wounds extending from his heel towards his midfoot mostly centered laterally. There is already been some healing distally. He does not appear to have an arterial issue. He has been using gentamicin to the wound surfaces with Iodoflex to help with ongoing debridement 1/6; this is a patient with pressure ulcers on the bottom of his feet related to excessive pressure from a standing position in the intensive care unit. He is complaining of a lot of pain in the right heel. He is not a diabetic. 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 We have been using Iodoflex to help prepare the surfaces of both wounds for an advanced  treatment product. He is nonambulatory spending most of his time in a wheelchair I have asked him not to propel the wheelchair with his heels 1/13; in general his wounds look better not much surface area change we have been using Iodoflex as of last week. I did an x-ray of the right heel as the patient was complaining of pain. I had some thoughts about a stress fracture perhaps Achilles tendon problems however what it showed was erosive changes along the inferior aspect of the calcaneus he now has a MRI booked for 1/20. 1/20; in general his wounds continue to be better. Some improvement in the large narrow areas proximally in his foot. He is still complaining of pain in the right heel and tenderness in certain areas of this wound. His MRI is tonight. I am not just looking for osteomyelitis that was brought up on the x-ray I am wondering about stress fractures, tendon ruptures etc. He has no such findings on the left. Also noteworthy is that the patient had critical illness neuropathy and some of the discomfort may be actual improvement in nerve function I am just not sure. These wounds were initially in the setting of severe critical illness related to COVID-19. He was put in a standing position. He may have also been on pressors at the point contributing to tissue ischemia. By his description at some point these wounds were grossly necrotic extending proximally up into the Achilles part of his heel. I do not know that I have ever really seen pictures of them like this although they may exist in epic We have ordered Tri layer Oasis. I am trying to stimulate some granulation in these areas. This is of course assuming the MRI is negative for infection 1/27; since the patient was last here he saw Dr. Juleen [place] of infectious disease. He is planned for vancomycin and ceftriaxone. Prior operative culture grew MSSA. Also ordered baseline lab work. He also ordered arterial studies although the ABIs in our  clinic were normal as well as his clinical exam these were normal I do not think he needs to see vascular surgery. His ABIs at the PTA were 1.22 in the right triphasic waveforms with a normal TBI of 1.15 on the left ABI of 1.22 with triphasic waveforms and a normal TBI of 1.08. Finally he saw Dr. Amalia Hailey who will follow him in for 2 months. At this point I do not think he felt that he needed a procedure on the right calcaneal bone. Dr. Juleen [place] is elected for broad-spectrum antibiotic The patient is still having pain in the right heel. He walks with a walker 2/3; wounds are generally smaller. He is tolerating his IV antibiotics. I believe this is vancomycin and ceftriaxone. We are still waiting for Oasis burn in terms of his out-of-pocket max which he should be meeting soon given the IV antibiotics, MRIs etc. I have asked him to check in on this. We are using silver collagen in the meantime the wounds look better 2/10; tolerating IV vancomycin and Rocephin. We are waiting to apply for Oasis. Although I am not really sure where he is in his out-of-pocket max. 2/17 started the first application of Oasis trilayer. Still on antibiotics. The wounds have generally look better. The area on the left has a little more surface slough requiring debridement 8/18; second application of Oasis trilayer. The wound surface  granulation is generally look better. The area on the left with undermining laterally I think is come in a bit. 10/08/2020 upon evaluation today patient is here today for [employer] application #3. Fortunately he seems to be doing extremely well with regard to this and we are seeing a lot of new epithelial growth which is great news. Fortunately there is no signs of active infection at this time. 10/16/2020 upon evaluation today patient appears to be doing well with regard to his foot ulcers. Do believe the Oasis has been of benefit for him. I do not see any signs of infection right now which is  great news and I think that he has a lot of new epithelial growth which is great to see as well. The patient is very pleased to hear all of this. I do think we can proceed with the Oasis trilayer #4 today. 3/18; not as much improvement in these areas on his heels that I was hoping. I did reapply trilateral Oasis today the tissue looks healthier but not as much fill in as I was hoping. 3/25; better looking today I think this is come in a bit the tissue looks healthier. Triple layer Oasis reapplied #6 4/1; somewhat better looking definitely better looking surface not as much change in surface area as I was hoping. He may be spending more time [place] on days then he needs to although he does have heel offloading boots. Triple layer Oasis reapplied #7 4/7; unfortunately apparently [facility] will not approve any further Oasis which is unfortunate since the patient did respond nicely both in terms of the condition of the wound bed as well as surface area. There is still some drainage coming from the wound but not a lot there does not appear to be any infection 4/15; we have been using Hydrofera Blue. He continues to have nice rims of epithelialization on the right greater than the left. The left the epithelialization is coming from the tip of his heel. There is moderate drainage. In this that concerns me about a total contact cast. There is no evidence of infection 4/29; patient has been using Hydrofera Blue with dressing changes. He has no complaints or issues today. 5/5; using Hydrofera Blue. I actually think that he looks marginally better than the last time I saw this 3 weeks ago. There are rims of epithelialization on the left thumb coming from the medial side on the right. Using Hydrofera Blue 5/12; using Hydrofera Blue. These continue to make improvements in surface area. His drainage was not listed as severe I therefore went ahead and put a cast on the left foot. Right foot we will  continue to dress his previous 5/16; back for first total contact cast change. He did not tolerate this particularly well cast injury on the anterior tibia among other issues. Difficulty sleeping. I talked him about this in some detail and afterwards is elected to continue. I told him I would like to have a cast on for 3 weeks to see if this is going to help at all. I think he agreed 5/19; I think the wound is better. There is no tunneling towards his midfoot. The undermining medially also looks better. He has a rim of new skin distally. I think we are making progress here. The area on the left also continues to look somewhat better to me using Hydrofera Blue. He has a list of complaints about the cast but none of them seem serious 5/26; patient  presents for 1 week follow-up. He has been using a total contact cast and tolerating this well. Hydrofera Blue is the main dressing used. He denies signs of infection. 6/2 Hydrofera Blue total contact cast on the left. These were large ulcers that formed in intensive care unit where the patient was recovering from [facility]. May have had something to do with being ventilated in an upright positiono Pressors etc. We have been able to get the areas down considerably and a viable surface. There is some epithelialization in both sides. Note made of drainage 6/9; changed to [facility] last time because of drainage. He arrives with better looking surfaces and dimensions on the left than the right. Paradoxically the right actually probes more towards his midfoot the left is largely close down but both of these look improved. Using a total contact cast on the left 6/16; complex wounds on his bilateral plantar heels which were initially pressure injury from a stay in the ICU with COVID. We have been using silver alginate most recently. His dimensions of come in quite dramatically however not recently. We have been putting the left foot in a total contact cast 6/23;  complex wounds on the bilateral plantar heels. I been putting the left in the cast paradoxically the area on the right is the one that is going towards closure at a faster rate. Quite a bit of drainage on the left. The patient went to see Dr. Amalia Hailey who said he was going to standby for skin grafts. I had actually considered sending him for skin grafts however he would be mandatorily off his feet for a period of weeks to months. I am thinking that the area on the right is going to close on its own the area on the left has been more stubborn even though we have him in a total contact cast 6/30; took him out of a total contact cast last week is the right heel seem to be making better progress than the left where I was placing the cast. We are using silver alginate. Both wounds are smaller right greater than left 7/12; both wounds look as though they are making some progress. We are using silver alginate. Heel offloading boots 7/26; very gradual progress especially on the right. Using silver alginate. He is wearing heel offloading boots 8/18; he continues to close these wounds down very gradually. Using silver alginate. The problem polymen being definitive about this is areas of what appears to be callus around the margins. This is not a 100% of the area but certainly sizable especially on the right 9/1; bilateral plantar feet wounds secondary to prolonged pressure while being ventilated for COVID-19 in an upright position. Essentially pressure ulcers on the bottom of his feet. He is made substantial progress using silver alginate. 9/14; bilateral plantar feet wounds secondary to prolonged pressure. Making progress using silver alginate. 9/29 bilateral plantar feet wounds secondary to prolonged pressure. I changed him to Iodoflex last week. MolecuLight showing reddened blush fluorescence 10/11; patient presents for follow-up. He has no issues or complaints today. He denies signs of infection. He continues  to use Iodoflex and antibiotic ointment to the wound beds. 10/27; 2-week follow-up. No evidence of infection. He has callus and thick dry skin around the wound margins we have been using Iodoflex and Bactroban which was in response to a moderate left MolecuLight reddish blush fluorescence. 11/10; 2-week follow-up. Wound margins again have thick callus however the measurements of the actual wound sites are a lot  smaller. Everything looks reasonably healthy here. We have been using Iodoflex He was approved for prime matrix but I have elected to delay this given the improvement in the surface area. Hopefully I will not regret that decision as were getting close to the end of the year in terms of insurance payment 12/8; 2-week follow-up. Wounds are generally smaller in size. These were initially substantial wounds extending into the forefoot all the way into the heel on the bilateral plantar feet. They are now both located on the plantar heel distal aspect both of these have a lot of callus around the wounds I used a #5 curette to remove this on the right and the left also some subcutaneous debris to try and get the wound edges were using Iodoflex. He has heel offloading shoe 12/22; 2-week follow-up. Not really much improvement. He has thick callus around the outer edges of both wounds. I remove this there is some nonviable subcutaneous tissue as well. We have been using Iodoflex. Her intake nurse and myself spontaneously thought of a total contact cast I went back in May. At that time we really were not seeing much of an improvement with a cast although the wound was in a much different situation I would like to retry this in 2 weeks and I discussed this with the patient 08/12/2021; the patient has had some improvement with the Iodoflex. The the area on the left heel plantar more improved than the right. I had to put him in a total contact cast on the left although I decided to put that off for 2 weeks. I  am going to change his primary dressing to silver collagen. I think in both areas he has had some improvement most of the healing seems to be more proximal in the heel. The wounds are in the mid aspect. A lot of thick callus on the right heel however. 1/19; we are using silver collagen on both plantar heel areas. He has had some improvement today. The left did not require any debridement. He still had some eschar on the right that was debrided but both seem to have contracted. I did not put it total contact cast on him today 2/2 we have been using silver collagen. The area on the right plantar heel has areas that appear to be epithelialized interspersed with dry flaking callus and dry skin. I removed this. This really looks better than on the other side. On the left still a large area with raised edges and debris on the surface. 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 He has no issues or complaints today. 2/9; not much change in the left foot wound with 1 week of a cast we are using silver collagen. Silver collagen on the right side. The right side has been the better wound surface. We will reapply the total contact cast on the left 2/16; not much improvement on either side I been using silver collagen with a total contact cast on the left. I'm changing the Hydrofera Blue still with a total contact cast on the left 2/23; some improvement on both sides. Disappointing that he has thick callus around the area that we are putting in a total contact cast on the left. We've been using Hydrofera Blue on both wound areas. This is a man who at essentially pressure ulcers in addition to ischemia caused  by medications to support his blood pressure (pressors) in the ICU. He was being ventilated in the standing position for severe Covid. A Shiley the wounds extended across his  entire foot but are now localized to his plantar heels bilaterally. We have made progress however neither areas healed. I continue to think the total contact cast is helped albeit painstakingly slowly. He has never wanted a plastic surgery consult although I don't know that they would be interested in grafting in area in this location. 10/07/2021: Continued improvement bilaterally. There is still some callus around the left wound, despite the total contact cast. He has some increased pain in his right midfoot around 1 particular area. This has been painful in the past but seems to be a little bit worse. When his cast was removed today, there was an area on the heel of the left foot that looks a bit macerated. He is also complaining of pain in his left thigh and hip which he thinks is secondary to the limb length discrepancy caused by the cast. 10/14/2021: He continues to improve. A little bit less callus around the left wound. He continues to endorse pain in his right midfoot, but this is not as significant as it was last week. The maceration on his left heel is improved. 10/21/2021: Continued improvement to both wounds. The maceration on his left heel is no longer evident. Less callus bilaterally. Epithelialization progressing. 10/28/2021: Significant improvement this week. The right sided wound is nearly closed with just a small open area at the middle. No maceration seen on the left heel. Continued epithelialization on both sides. No concern for infection. 11/04/2021: T oday, the wounds were measured a little bit differently and come out as larger, but I actually think they are about the same to potentially even smaller, particularly on the left. He continues to accumulate some callus on the right. 11/11/2021: T oday, the patient is expressing some concern that the left wound, despite being in the total contact cast, is not progressing at the same rate as the 1 on the right. He is interested in trying a  week without the cast to see how the wound does. The wounds are roughly the same size as last week, with the right perhaps being a little bit smaller. He continues to build up callus on both sites. 11/18/2021: Last week, I permitted the patient to go without his total contact cast, just to see if the cast was really making any difference. Today, both wounds have deteriorated to some extent, suggesting that the cast is providing benefit, at least on the left. Both are larger and have accumulated callus, slough, and other debris. 11/26/2021: I debrided both wounds quite aggressively last week in an effort to stimulate the healing cascade. This appears to have been effective as the left sided wound is a full centimeter shorter in length. Although the right was measured slightly larger, on inspection, it looks as though an area of epithelialized tissue was included in the measurements. We have been using PolyMem Ag on the wound surfaces with a total contact cast to the left. 12/02/2021: It appears that the intake personnel are including epithelialized tissue in his wound measurements; the right wound is almost completely epithelialized; there is just a crater at the proximal midfoot with some open areas. On the left, he has built up some callus, but the overall wound surface looks good. There is some senescent skin around the wound margin. He has been in PolyMem Ag bilaterally with  a total contact cast on the left. 12/09/2021: The right wound is nearly closed; there is just a small open area at the mid calcaneus. On the left, the wound is smaller with minimal callus buildup. No significant drainage. 12/16/2021: The right calcaneal wound remains minimally open at the mid calcaneus; the rest has epithelialized. On the left, the wound is also a little bit smaller. There is some senescent tissue on the periphery. He is getting his first application of a trial skin substitute called Vendaje today. 12/23/2021: The  wound on his right calcaneus is nearly closed; there is just a small area at the most distal aspect of the calcaneus that is open. On the left, the area where we applied to the skin substitute has a healthier-looking bed of granulation tissue. The wound dimensions are not significantly different on this side but the wound surface is improved. 12/30/2021: The wound on the right calcaneus has not changed significantly aside from some accumulation of callus. On the left, the open area is smaller and continues to have an improved surface. He continues to accumulate callus around the wound. He is here for his third application of Vendaje. 01/06/2022: The right calcaneal wound is down to just a couple of millimeters. He continues to accumulate periwound callus. He unfortunately got his cast wet earlier in the week and his left foot is macerated, resulting in some superficial skin loss just distal to the open wound. The open wound itself, however, is much smaller and has a healthier appearing surface. He is here for his fourth application of Vendaje. 01/13/2022: The right calcaneal wound is about the same. Unfortunately, once again, his cast got wet and his foot is again macerated. This is caused the left calcaneal wound to enlarge. He is here for his fifth application of Vendaje. 01/20/2022: The right calcaneal wound is very small. There is some periwound callus accumulation. He purchased a new cast protector last week and this has been effective in avoiding the maceration that has been occurring on the left. The left calcaneal wound is narrower and has a healthy and viable-appearing surface. He is here for his 6 application of Vendaje. 01/27/2022: The right calcaneal wound is down to just a pinhole. There is some periwound slough and callus. On the left, the wound is narrower and shorter by about a centimeter. The surface is robust and viable-appearing. Unfortunately, the rep for the trial skin substitute product  did not provide any for [place] to use today. 02/04/2022: The right calcaneal wound remains unchanged. There is more accumulated callus. On the left, although the intake nurse measured it a little bit longer, it looks about the same to me. The surface has a layer of slough, but underneath this, there is good granulation tissue. 02/10/2022: The right calcaneus wound is nearly closed. There is still some callus that builds up around the site. The left side looks about the same in terms of dimensions, but the surface is more robust and vital-appearing. 02/16/2022: The area of the right calcaneus that was nearly closed last week has closed, but there is a small opening at the mid foot where it looks like some moisture got retained and caused some reopening. The left foot wound is narrower and shallower. Both sites have a fair amount of periwound callus and eschar. 02/24/2022: The small midfoot opening on the right calcaneus is a little bit smaller today. The left foot wound is narrower and shallower. He continues to accumulate periwound callus. No concern for infection.  03/01/2022: The patient came to clinic early because he showered and got his cast wet. Fortunately, there is no significant maceration to his foot but the callus softened and it looks like the wound on his left calcaneus may be a little bit wider. The wound on his right calcaneus is just a narrow slit. Continued accumulation of periwound callus bilaterally. 03/08/2022: The wound on his right calcaneus is very nearly closed, just a small pinpoint opening under a bit of eschar; the left wound has come in quite a bit, as well. It is narrower and shorter than at our last visit. Still with accumulated callus and eschar bilaterally. 03/17/2022: The right calcaneal wound is healed. The left wound is smaller and the surface itself is very clean, but there is some blue-green staining on the periwound callus, concerning for Pseudomonas aeruginosa. 03/23/2022:  The right calcaneal wound remains closed. The left wound continues to contract. No further blue-green staining. Small amount of callus and slough accumulation. 03/28/2022: He came in early today because he had gotten his cast wet. On inspection, the wound itself did not get wet or macerated, just a little bit of the forefoot. The wound itself is basically unchanged. 04/07/2022: The right foot wound remains closed. The left wound is the smallest that I have seen it to date. It is narrower and shorter. It still continues to accumulate slough on the surface. 04/15/2022: There is a band of epithelium now dividing the small left plantar foot wound in 2. There is still some slough on the surface. 04/21/2022: The wound continues to narrow. Just a little bit of slough on the surface. He seems to be responding well to endoform. 04/28/2022: Continued slow contraction of the wound. There is a little slough on the surface and some periwound callus. We have been using endoform and total contact cast. 05/05/2022: The wound appears to have stalled. There is slough and some periwound eschar/callus. No concern for infection, however. Electronic Signature(s) Signed: 05/05/2022 11:54:52 AM By: [name] Maudlin MD FACS Entered By: [name] on 05/05/2022 11:54:52 -------------------------------------------------------------------------------- Physical Exam Details Patient Name: Date of Service: [name], [place] 05/05/2022 11:00 A M Medical Record Number: [id number] Patient Account Number: [id number] Date of Birth/Sex: Treating RN: 1975-11-14 (46 y.o. M) Primary Care Provider: Cristie [name] Other Clinician: Referring Provider: Treating Provider/Extender: [name] in Treatment: 20 Constitutional No acute distress.Marland Kitchen Respiratory Normal work of breathing on room air.. Notes 05/05/2022: The wound appears to have stalled. There is slough and some periwound eschar/callus. No concern for  infection, however. Electronic Signature(s) Signed: 05/05/2022 11:55:14 AM By: [name] Maudlin MD FACS Entered By: [name] on 05/05/2022 11:55:14 -------------------------------------------------------------------------------- Physician Orders Details Patient Name: Date of Service: [name], [place] 05/05/2022 11:00 A M Medical Record Number: [id number] Patient Account Number: [id number] Date of Birth/Sex: Treating RN: September 16, 1975 (46 y.o. [name] Primary Care Provider: Cristie [name] Other Clinician: Referring Provider: Treating Provider/Extender: [name] in Treatment: 81 Verbal / Phone Orders: No Diagnosis Coding ICD-10 Coding Code Description [phone] Non-pressure chronic ulcer of other part of left foot with fat layer exposed Follow-up Appointments ppointment in 1 week. - Dr [name] Room 3 10/5;@9 :30am;10/12 at 0930;10/19 at 0930;10/26 at 0930 Return A Anesthetic Wound #2 Left Calcaneus (In clinic) Topical Lidocaine 4% applied to wound bed - In clinic Bathing/ Shower/ Hygiene May shower with protection but do not get wound dressing(s) wet. - Cover with cast protector (can purchase cast protector at [facility]  or [facility] ) Edema Control - Lymphedema / SCD / Other Bilateral Lower Extremities Elevate legs to the level of the heart or above for 30 minutes daily and/or when sitting, a frequency of: - throughout the day Avoid standing for long periods of time. Moisturize legs daily. - right leg and foot every night. Off-Loading Total Contact Cast to Left Lower Extremity - TCC to Left LL Other: - keep pressure off of the bottom of your feet Additional Orders / Instructions Follow Nutritious Diet Non Wound Condition Right Lower Extremity Other Non Wound Condition Orders/Instructions: - Right Heel-Use Opifoam and Border Foam and Kerlix/Medipore soft cloth surgical tape (4x10 in/yd) for Non wound on Right Heel. Wound Treatment Wound #2 -  Calcaneus Wound Laterality: Left Cleanser: Normal Saline (Generic) 1 x Per Week/30 Days Discharge Instructions: Cleanse the wound with Normal Saline prior to applying a clean dressing using gauze sponges, not tissue or cotton balls. Cleanser: Wound Cleanser 1 x Per Week/30 Days Discharge Instructions: Cleanse the wound with wound cleanser prior to applying a clean dressing using gauze sponges, not tissue or cotton balls. Prim Dressing: Endoform 2x2 in 1 x Per Week/30 Days ary Discharge Instructions: Moisten with saline Secondary Dressing: Optifoam Non-Adhesive Dressing, 4x4 in (Generic) 1 x Per Week/30 Days Discharge Instructions: Apply over primary dressing as directed. Secondary Dressing: Woven Gauze Sponge, Non-Sterile 4x4 in 1 x Per Week/30 Days Discharge Instructions: Apply over primary dressing as directed. Secured With: 72M Medipore H Soft Cloth Surgical T ape, 4 x 10 (in/yd) (Generic) 1 x Per Week/30 Days Discharge Instructions: Secure with tape as directed. Electronic Signature(s) Signed: 05/05/2022 12:21:30 PM By: [name] Maudlin MD FACS Signed: 05/05/2022 5:48:04 PM By: [name] Entered By: [name] on 05/05/2022 12:19:38 -------------------------------------------------------------------------------- Problem List Details Patient Name: Date of Service: [name], [place] 05/05/2022 11:00 A M Medical Record Number: [id number] Patient Account Number: [id number] Date of Birth/Sex: Treating RN: 1976-04-23 (46 y.o. M) Primary Care Provider: Cristie [name] Other Clinician: Referring Provider: Treating Provider/Extender: [name] in Treatment: 82 Active Problems ICD-10 Encounter Code Description Active Date MDM Diagnosis L97.522 Non-pressure chronic ulcer of other part of left foot with fat layer exposed 09/03/2020 No Yes Inactive Problems ICD-10 Code Description Active Date Inactive Date L97.512 Non-pressure chronic ulcer of other part  of right foot with fat layer exposed 09/03/2020 09/03/2020 L89.893 Pressure ulcer of other site, stage 3 07/15/2020 07/15/2020 M62.81 Muscle weakness (generalized) 07/15/2020 07/15/2020 I10 Essential (primary) hypertension 07/15/2020 07/15/2020 M86.171 Other acute osteomyelitis, right ankle and foot 09/03/2020 09/03/2020 Resolved Problems Electronic Signature(s) Signed: 05/05/2022 11:53:46 AM By: [name] Maudlin MD FACS Entered By: [name] on 05/05/2022 11:53:46 -------------------------------------------------------------------------------- Progress Note Details Patient Name: Date of Service: [name], [place]. 05/05/2022 11:00 A M Medical Record Number: [id number] Patient Account Number: [id number] Date of Birth/Sex: Treating RN: [birth year] (46 y.o. M) Primary Care Provider: Cristie [name] Other Clinician: Referring Provider: Treating Provider/Extender: [name] in Treatment: 31 Subjective Chief Complaint Information obtained from Patient Bilateral Plantar Foot Ulcers History of Present Illness (HPI) Wounds are12/03/2020 upon evaluation today patient presents for initial inspection here in our clinic concerning issues he has been having with the bottoms of his feet bilaterally. He states these actually occurred as wounds when he was hospitalized for 5 months secondary to Covid. He was apparently with tilting bed where he was in an upright position quite frequently and apparently this occurred in some way shape or form during that  time. Fortunately there is no sign of active infection at this time. No fevers, chills, nausea, vomiting, or diarrhea. With that being said he still has substantial wounds on the plantar aspects of his feet Theragen require quite a bit of work to get these to heal. He has been using Santyl currently though that is been problematic both in receiving the medication as well as actually paid for it as it is become quite expensive. Prior to  the experience with Covid the patient really did not have any major medical problems other than hypertension he does have some mild generalized weakness following the Covid experience. 07/22/2020 on evaluation today patient appears to be doing okay in regard to his foot ulcers I feel like the wound beds are showing signs of better improvement that I do believe the Iodoflex is helping in this regard. With that being said he does have a lot of drainage currently and this is somewhat blue/green in nature which is consistent with Pseudomonas. I do think a culture today would be appropriate for [place] to evaluate and see if that is indeed the case I would likely start him on antibiotic orally as well he is not allergic to Cipro knows of no issues he has had in the past 12/21; patient was admitted to the clinic earlier this month with bilateral presumed pressure ulcers on the bottom of his feet apparently related to excessive pressure from a tilt table arrangement in the intensive care unit. Patient relates this to being on ECMO but I am not really sure that is exactly related to that. I must say I have never seen anything like this. He has fairly extensive full-thickness wounds extending from his heel towards his midfoot mostly centered laterally. There is already been some healing distally. He does not appear to have an arterial issue. He has been using gentamicin to the wound surfaces with Iodoflex to help with ongoing debridement 1/6; this is a patient with pressure ulcers on the bottom of his feet related to excessive pressure from a standing position in the intensive care unit. He is complaining of a lot of pain in the right heel. He is not a diabetic. 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 We have been using Iodoflex to help prepare the surfaces of both wounds for an advanced treatment product. He is nonambulatory spending most of his time in a wheelchair I have asked him not to  propel the wheelchair with his heels 1/13; in general his wounds look better not much surface area change we have been using Iodoflex as of last week. I did an x-ray of the right heel as the patient was complaining of pain. I had some thoughts about a stress fracture perhaps Achilles tendon problems however what it showed was erosive changes along the inferior aspect of the calcaneus he now has a MRI booked for 1/20. 1/20; in general his wounds continue to be better. Some improvement in the large narrow areas proximally in his foot. He is still complaining of pain in the right heel and tenderness in certain areas of this wound. His MRI is tonight. I am not just looking for osteomyelitis that was brought up on the x-ray I am wondering about stress fractures, tendon ruptures etc. He has no such findings on the left. Also noteworthy is that the patient had critical illness neuropathy and some of the discomfort may be actual improvement in nerve function I am just not sure. These wounds were initially  in the setting of severe critical illness related to COVID-19. He was put in a standing position. He may have also been on pressors at the point contributing to tissue ischemia. By his description at some point these wounds were grossly necrotic extending proximally up into the Achilles part of his heel. I do not know that I have ever really seen pictures of them like this although they may exist in epic We have ordered Tri layer Oasis. I am trying to stimulate some granulation in these areas. This is of course assuming the MRI is negative for infection 1/27; since the patient was last here he saw Dr. Juleen China of infectious disease. He is planned for vancomycin and ceftriaxone. Prior operative culture grew MSSA. Also ordered baseline lab work. He also ordered arterial studies although the ABIs in our clinic were normal as well as his clinical exam these were normal I do not think he needs to see vascular  surgery. His ABIs at the [facility] were 1.22 in the right triphasic waveforms with a normal TBI of 1.15 on the left ABI of 1.22 with triphasic waveforms and a normal TBI of 1.08. Finally he saw Dr. Amalia Hailey who will follow him in for 2 months. At this point I do not think he felt that he needed a procedure on the right calcaneal bone. Dr. Juleen China is elected for broad-spectrum antibiotic The patient is still having pain in the right heel. He walks with a walker 2/3; wounds are generally smaller. He is tolerating his IV antibiotics. I believe this is vancomycin and ceftriaxone. We are still waiting for Oasis burn in terms of his out-of-pocket max which he should be meeting soon given the IV antibiotics, MRIs etc. I have asked him to check in on this. We are using silver collagen in the meantime the wounds look better 2/10; tolerating IV vancomycin and Rocephin. We are waiting to apply for Oasis. Although I am not really sure where he is in his out-of-pocket max. 2/17 started the first application of Oasis trilayer. Still on antibiotics. The wounds have generally look better. The area on the left has a little more surface slough requiring debridement [id number]; second application of Oasis trilayer. The wound surface granulation is generally look better. The area on the left with undermining laterally I think is come in a bit. 10/08/2020 upon evaluation today patient is here today for [employer] application #3. Fortunately he seems to be doing extremely well with regard to this and we are seeing a lot of new epithelial growth which is great news. Fortunately there is no signs of active infection at this time. 10/16/2020 upon evaluation today patient appears to be doing well with regard to his foot ulcers. Do believe the Oasis has been of benefit for him. I do not see any signs of infection right now which is great news and I think that he has a lot of new epithelial growth which is great to see as well. The patient  is very pleased to hear all of this. I do think we can proceed with the Oasis trilayer #4 today. 3/18; not as much improvement in these areas on his heels that I was hoping. I did reapply trilateral Oasis today the tissue looks healthier but not as much fill in as I was hoping. 3/25; better looking today I think this is come in a bit the tissue looks healthier. Triple layer Oasis reapplied #6 4/1; somewhat better looking definitely better looking surface not as much change  in surface area as I was hoping. He may be spending more time [place] on days then he needs to although he does have heel offloading boots. Triple layer Oasis reapplied #7 4/7; unfortunately apparently [facility] will not approve any further Oasis which is unfortunate since the patient did respond nicely both in terms of the condition of the wound bed as well as surface area. There is still some drainage coming from the wound but not a lot there does not appear to be any infection 4/15; we have been using Hydrofera Blue. He continues to have nice rims of epithelialization on the right greater than the left. The left the epithelialization is coming from the tip of his heel. There is moderate drainage. In this that concerns me about a total contact cast. There is no evidence of infection 4/29; patient has been using Hydrofera Blue with dressing changes. He has no complaints or issues today. 5/5; using Hydrofera Blue. I actually think that he looks marginally better than the last time I saw this 3 weeks ago. There are rims of epithelialization on the left thumb coming from the medial side on the right. Using Hydrofera Blue 5/12; using Hydrofera Blue. These continue to make improvements in surface area. His drainage was not listed as severe I therefore went ahead and put a cast on the left foot. Right foot we will continue to dress his previous 5/16; back for first total contact cast change. He did not tolerate this  particularly well cast injury on the anterior tibia among other issues. Difficulty sleeping. I talked him about this in some detail and afterwards is elected to continue. I told him I would like to have a cast on for 3 weeks to see if this is going to help at all. I think he agreed 5/19; I think the wound is better. There is no tunneling towards his midfoot. The undermining medially also looks better. He has a rim of new skin distally. I think we are making progress here. The area on the left also continues to look somewhat better to me using Hydrofera Blue. He has a list of complaints about the cast but none of them seem serious 5/26; patient presents for 1 week follow-up. He has been using a total contact cast and tolerating this well. Hydrofera Blue is the main dressing used. He denies signs of infection. 6/2 Hydrofera Blue total contact cast on the left. These were large ulcers that formed in intensive care unit where the patient was recovering from [facility]. May have had something to do with being ventilated in an upright positiono Pressors etc. We have been able to get the areas down considerably and a viable surface. There is some epithelialization in both sides. Note made of drainage 6/9; changed to [facility] last time because of drainage. He arrives with better looking surfaces and dimensions on the left than the right. Paradoxically the right actually probes more towards his midfoot the left is largely close down but both of these look improved. Using a total contact cast on the left 6/16; complex wounds on his bilateral plantar heels which were initially pressure injury from a stay in the ICU with COVID. We have been using silver alginate most recently. His dimensions of come in quite dramatically however not recently. We have been putting the left foot in a total contact cast 6/23; complex wounds on the bilateral plantar heels. I been putting the left in the cast paradoxically the area  on  the right is the one that is going towards closure at a faster rate. Quite a bit of drainage on the left. The patient went to see Dr. Amalia Hailey who said he was going to standby for skin grafts. I had actually considered sending him for skin grafts however he would be mandatorily off his feet for a period of weeks to months. I am thinking that the area on the right is going to close on its own the area on the left has been more stubborn even though we have him in a total contact cast 6/30; took him out of a total contact cast last week is the right heel seem to be making better progress than the left where I was placing the cast. We are using silver alginate. Both wounds are smaller right greater than left 7/12; both wounds look as though they are making some progress. We are using silver alginate. Heel offloading boots 7/26; very gradual progress especially on the right. Using silver alginate. He is wearing heel offloading boots 8/18; he continues to close these wounds down very gradually. Using silver alginate. The problem polymen being definitive about this is areas of what appears to be callus around the margins. This is not a 100% of the area but certainly sizable especially on the right 9/1; bilateral plantar feet wounds secondary to prolonged pressure while being ventilated for COVID-19 in an upright position. Essentially pressure ulcers on the bottom of his feet. He is made substantial progress using silver alginate. 9/14; bilateral plantar feet wounds secondary to prolonged pressure. Making progress using silver alginate. 9/29 bilateral plantar feet wounds secondary to prolonged pressure. I changed him to Iodoflex last week. MolecuLight showing reddened blush fluorescence 10/11; patient presents for follow-up. He has no issues or complaints today. He denies signs of infection. He continues to use Iodoflex and antibiotic ointment to the wound beds. 10/27; 2-week follow-up. No evidence of  infection. He has callus and thick dry skin around the wound margins we have been using Iodoflex and Bactroban which was in response to a moderate left MolecuLight reddish blush fluorescence. 11/10; 2-week follow-up. Wound margins again have thick callus however the measurements of the actual wound sites are a lot smaller. Everything looks reasonably healthy here. We have been using Iodoflex He was approved for prime matrix but I have elected to delay this given the improvement in the surface area. Hopefully I will not regret that decision as were getting close to the end of the year in terms of insurance payment 12/8; 2-week follow-up. Wounds are generally smaller in size. These were initially substantial wounds extending into the forefoot all the way into the heel on the bilateral plantar feet. They are now both located on the plantar heel distal aspect both of these have a lot of callus around the wounds I used a #5 curette to remove this on the right and the left also some subcutaneous debris to try and get the wound edges were using Iodoflex. He has heel offloading shoe 12/22; 2-week follow-up. Not really much improvement. He has thick callus around the outer edges of both wounds. I remove this there is some nonviable subcutaneous tissue as well. We have been using Iodoflex. Her intake nurse and myself spontaneously thought of a total contact cast I went back in May. At that time we really were not seeing much of an improvement with a cast although the wound was in a much different situation I would like to retry this in 2  weeks and I discussed this with the patient 08/12/2021; the patient has had some improvement with the Iodoflex. The the area on the left heel plantar more improved than the right. I had to put him in a total contact cast on the left although I decided to put that off for 2 weeks. I am going to change his primary dressing to silver collagen. I think in both areas he has had some  improvement most of the healing seems to be more proximal in the heel. The wounds are in the mid aspect. A lot of thick callus on the right heel however. 1/19; we are using silver collagen on both plantar heel areas. He has had some improvement today. The left did not require any debridement. He still had some eschar on the right that was debrided but both seem to have contracted. I did not put it total contact cast on him today 2/2 we have been using silver collagen. The area on the right plantar heel has areas that appear to be epithelialized interspersed with dry flaking callus and dry skin. I removed this. This really looks better than on the other side. On the left still a large area with raised edges and debris on the surface. 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 He has no issues or complaints today. 2/9; not much change in the left foot wound with 1 week of a cast we are using silver collagen. Silver collagen on the right side. The right side has been the better wound surface. We will reapply the total contact cast on the left 2/16; not much improvement on either side I been using silver collagen with a total contact cast on the left. I'm changing the Hydrofera Blue still with a total contact cast on the left 2/23; some improvement on both sides. Disappointing that he has thick callus around the area that we are putting in a total contact cast on the left. We've been using Hydrofera Blue on both wound areas. This is a man who at essentially pressure ulcers in addition to ischemia caused by medications to support his blood pressure (pressors) in the ICU. He was being ventilated in the standing position for severe Covid. A Sh[place] the wounds extended across his entire foot but are now localized to his plantar heels bilaterally. We have made progress however  neither areas healed. I continue to think the total contact cast is helped albeit painstakingly slowly. He has never wanted a plastic surgery consult although I don't know that they would be interested in grafting in area in this location. 10/07/2021: Continued improvement bilaterally. There is still some callus around the left wound, despite the total contact cast. He has some increased pain in his right midfoot around 1 particular area. This has been painful in the past but seems to be a little bit worse. When his cast was removed today, there was an area on the heel of the left foot that looks a bit macerated. He is also complaining of pain in his left thigh and hip which he thinks is secondary to the limb length discrepancy caused by the cast. 10/14/2021: He continues to improve. A little bit less callus around the left wound. He continues to endorse pain in his right midfoot, but this is not as significant as it was last week. The maceration on his left heel  is improved. 10/21/2021: Continued improvement to both wounds. The maceration on his left heel is no longer evident. Less callus bilaterally. Epithelialization progressing. 10/28/2021: Significant improvement this week. The right sided wound is nearly closed with just a small open area at the middle. No maceration seen on the left heel. Continued epithelialization on both sides. No concern for infection. 11/04/2021: T oday, the wounds were measured a little bit differently and come out as larger, but I actually think they are about the same to potentially even smaller, particularly on the left. He continues to accumulate some callus on the right. 11/11/2021: T oday, the patient is expressing some concern that the left wound, despite being in the total contact cast, is not progressing at the same rate as the 1 on the right. He is interested in trying a week without the cast to see how the wound does. The wounds are roughly the same size as last week,  with the right perhaps being a little bit smaller. He continues to build up callus on both sites. 11/18/2021: Last week, I permitted the patient to go without his total contact cast, just to see if the cast was really making any difference. Today, both wounds have deteriorated to some extent, suggesting that the cast is providing benefit, at least on the left. Both are larger and have accumulated callus, slough, and other debris. 11/26/2021: I debrided both wounds quite aggressively last week in an effort to stimulate the healing cascade. This appears to have been effective as the left sided wound is a full centimeter shorter in length. Although the right was measured slightly larger, on inspection, it looks as though an area of epithelialized tissue was included in the measurements. We have been using PolyMem Ag on the wound surfaces with a total contact cast to the left. 12/02/2021: It appears that the intake personnel are including epithelialized tissue in his wound measurements; the right wound is almost completely epithelialized; there is just a crater at the proximal midfoot with some open areas. On the left, he has built up some callus, but the overall wound surface looks good. There is some senescent skin around the wound margin. He has been in PolyMem Ag bilaterally with a total contact cast on the left. 12/09/2021: The right wound is nearly closed; there is just a small open area at the mid calcaneus. On the left, the wound is smaller with minimal callus buildup. No significant drainage. 12/16/2021: The right calcaneal wound remains minimally open at the mid calcaneus; the rest has epithelialized. On the left, the wound is also a little bit smaller. There is some senescent tissue on the periphery. He is getting his first application of a trial skin substitute called Vendaje today. 12/23/2021: The wound on his right calcaneus is nearly closed; there is just a small area at the most distal aspect of  the calcaneus that is open. On the left, the area where we applied to the skin substitute has a healthier-looking bed of granulation tissue. The wound dimensions are not significantly different on this side but the wound surface is improved. 12/30/2021: The wound on the right calcaneus has not changed significantly aside from some accumulation of callus. On the left, the open area is smaller and continues to have an improved surface. He continues to accumulate callus around the wound. He is here for his third application of Vendaje. 01/06/2022: The right calcaneal wound is down to just a couple of millimeters. He continues to accumulate periwound callus. He  unfortunately got his cast wet earlier in the week and his left foot is macerated, resulting in some superficial skin loss just distal to the open wound. The open wound itself, however, is much smaller and has a healthier appearing surface. He is here for his fourth application of Vendaje. 01/13/2022: The right calcaneal wound is about the same. Unfortunately, once again, his cast got wet and his foot is again macerated. This is caused the left calcaneal wound to enlarge. He is here for his fifth application of Vendaje. 01/20/2022: The right calcaneal wound is very small. There is some periwound callus accumulation. He purchased a new cast protector last week and this has been effective in avoiding the maceration that has been occurring on the left. The left calcaneal wound is narrower and has a healthy and viable-appearing surface. He is here for his 6 application of Vendaje. 01/27/2022: The right calcaneal wound is down to just a pinhole. There is some periwound slough and callus. On the left, the wound is narrower and shorter by about a centimeter. The surface is robust and viable-appearing. Unfortunately, the rep for the trial skin substitute product did not provide any for [place] to use today. 02/04/2022: The right calcaneal wound remains unchanged.  There is more accumulated callus. On the left, although the intake nurse measured it a little bit longer, it looks about the same to me. The surface has a layer of slough, but underneath this, there is good granulation tissue. 02/10/2022: The right calcaneus wound is nearly closed. There is still some callus that builds up around the site. The left side looks about the same in terms of dimensions, but the surface is more robust and vital-appearing. 02/16/2022: The area of the right calcaneus that was nearly closed last week has closed, but there is a small opening at the mid foot where it looks like some moisture got retained and caused some reopening. The left foot wound is narrower and shallower. Both sites have a fair amount of periwound callus and eschar. 02/24/2022: The small midfoot opening on the right calcaneus is a little bit smaller today. The left foot wound is narrower and shallower. He continues to accumulate periwound callus. No concern for infection. 03/01/2022: The patient came to clinic early because he showered and got his cast wet. Fortunately, there is no significant maceration to his foot but the callus softened and it looks like the wound on his left calcaneus may be a little bit wider. The wound on his right calcaneus is just a narrow slit. Continued accumulation of periwound callus bilaterally. 03/08/2022: The wound on his right calcaneus is very nearly closed, just a small pinpoint opening under a bit of eschar; the left wound has come in quite a bit, as well. It is narrower and shorter than at our last visit. Still with accumulated callus and eschar bilaterally. 03/17/2022: The right calcaneal wound is healed. The left wound is smaller and the surface itself is very clean, but there is some blue-green staining on the periwound callus, concerning for Pseudomonas aeruginosa. 03/23/2022: The right calcaneal wound remains closed. The left wound continues to contract. No further blue-green  staining. Small amount of callus and slough accumulation. 03/28/2022: He came in early today because he had gotten his cast wet. On inspection, the wound itself did not get wet or macerated, just a little bit of the forefoot. The wound itself is basically unchanged. 04/07/2022: The right foot wound remains closed. The left wound is the smallest that I  have seen it to date. It is narrower and shorter. It still continues to accumulate slough on the surface. 04/15/2022: There is a band of epithelium now dividing the small left plantar foot wound in 2. There is still some slough on the surface. 04/21/2022: The wound continues to narrow. Just a little bit of slough on the surface. He seems to be responding well to endoform. 04/28/2022: Continued slow contraction of the wound. There is a little slough on the surface and some periwound callus. We have been using endoform and total contact cast. 05/05/2022: The wound appears to have stalled. There is slough and some periwound eschar/callus. No concern for infection, however. Patient History Information obtained from Patient. Family History Cancer - Maternal Grandparents, Diabetes - Father,Paternal Grandparents, Heart Disease - Maternal Grandparents, Hypertension - Father,Paternal Grandparents, Lung Disease - Siblings, No family history of Hereditary Spherocytosis, Kidney Disease, Seizures, Stroke, Thyroid Problems, Tuberculosis. Social History Never smoker, Marital Status - Married, Alcohol Use - Never, Drug Use - No History, Caffeine Use - Daily - tea, soda. Medical History Eyes Denies history of Cataracts, Glaucoma, Optic Neuritis Ear/Nose/Mouth/Throat Denies history of Chronic sinus problems/congestion, Middle ear problems Hematologic/Lymphatic Denies history of Anemia, Hemophilia, Human Immunodeficiency Virus, Lymphedema, Sickle Cell Disease Respiratory Patient has history of Asthma Denies history of Aspiration, Chronic Obstructive Pulmonary Disease  (COPD), Pneumothorax, Sleep Apnea, Tuberculosis Cardiovascular Patient has history of Angina - with COVID, Hypertension Denies history of Arrhythmia, Congestive Heart Failure, Coronary Artery Disease, Deep Vein Thrombosis, Hypotension, Myocardial Infarction, Peripheral Arterial Disease, Peripheral Venous Disease, Phlebitis, Vasculitis Gastrointestinal Denies history of Cirrhosis , Colitis, Crohnoos, Hepatitis A, Hepatitis B, Hepatitis C Endocrine Denies history of Type I Diabetes, Type II Diabetes Genitourinary Denies history of End Stage Renal Disease Immunological Denies history of Lupus Erythematosus, Raynaudoos, Scleroderma Integumentary (Skin) Denies history of History of Burn Musculoskeletal Denies history of Gout, Rheumatoid Arthritis, Osteoarthritis, Osteomyelitis Neurologic Denies history of Dementia, Neuropathy, Quadriplegia, Paraplegia, Seizure Disorder Oncologic Denies history of Received Chemotherapy, Received Radiation Psychiatric Denies history of Anorexia/bulimia, Confinement Anxiety Hospitalization/Surgery History - COVID PNA 07/22/2019- 11/14/2019. - 03/27/2020 wound debridement/ skin graft. Medical A Surgical History Notes nd Constitutional Symptoms (General Health) COVID PNA 07/22/2019-11/14/2019 [facility] ECMO, foot drop left foot , Genitourinary kidney stone Psychiatric anxiety Objective Constitutional No acute distress.Marland Kitchen Respiratory Normal work of breathing on room air.. General Notes: 05/05/2022: The wound appears to have stalled. There is slough and some periwound eschar/callus. No concern for infection, however. Integumentary (Hair, Skin) Wound #2 status is Open. Original cause of wound was Pressure Injury. The date acquired was: 10/07/2019. The wound has been in treatment 94 weeks. The wound is located on the Left Calcaneus. The wound measures 0.4cm length x 0.2cm width x 0.2cm depth; 0.063cm^2 area and 0.013cm^3 volume. There is Fat Layer (Subcutaneous  Tissue) exposed. There is no tunneling or undermining noted. There is a medium amount of serosanguineous drainage noted. The wound margin is thickened. There is small (1-33%) red granulation within the wound bed. There is a large (67-100%) amount of necrotic tissue within the wound bed including Eschar and Adherent Slough. Assessment Active Problems ICD-10 Non-pressure chronic ulcer of other part of left foot with fat layer exposed Procedures Wound #2 Pre-procedure diagnosis of Wound #2 is a Pressure Ulcer located on the Left Calcaneus . There was a T Contact Cast Procedure by [name] Maudlin, MD. otal Post procedure Diagnosis Wound #2: Same as Pre-Procedure Plan Follow-up Appointments: Return Appointment in 1 week. - Dr [name] Room 3 10/5;@9 :30am;10/12 at  0930;10/19 at 0930;10/26 at 0930 Anesthetic: Wound #2 Left Calcaneus: (In clinic) Topical Lidocaine 4% applied to wound bed - In clinic Bathing/ Shower/ Hygiene: May shower with protection but do not get wound dressing(s) wet. - Cover with cast protector (can purchase cast protector at [facility] or [facility] ) Edema Control - Lymphedema / SCD / Other: Elevate legs to the level of the heart or above for 30 minutes daily and/or when sitting, a frequency of: - throughout the day Avoid standing for long periods of time. Moisturize legs daily. - right leg and foot every night. Off-Loading: T Contact Cast to Left Lower Extremity - TCC to Left LL otal Other: - keep pressure off of the bottom of your feet Additional Orders / Instructions: Follow Nutritious Diet Non Wound Condition: Other Non Wound Condition Orders/Instructions: - Right Heel-Use Opifoam and Border Foam and Kerlix/Medipore soft cloth surgical tape (4x10 in/yd) for Non wound on Right Heel. WOUND #2: - Calcaneus Wound Laterality: Left Cleanser: Normal Saline (Generic) 1 x Per Week/30 Days Discharge Instructions: Cleanse the wound with Normal Saline prior to applying a clean  dressing using gauze sponges, not tissue or cotton balls. Cleanser: Wound Cleanser 1 x Per Week/30 Days Discharge Instructions: Cleanse the wound with wound cleanser prior to applying a clean dressing using gauze sponges, not tissue or cotton balls. Prim Dressing: Endoform 2x2 in 1 x Per Week/30 Days ary Discharge Instructions: Moisten with saline Secondary Dressing: Optifoam Non-Adhesive Dressing, 4x4 in (Generic) 1 x Per Week/30 Days Discharge Instructions: Apply over primary dressing as directed. Secondary Dressing: Woven Gauze Sponge, Non-Sterile 4x4 in 1 x Per Week/30 Days Discharge Instructions: Apply over primary dressing as directed. Secured With: 21M Medipore H Soft Cloth Surgical T ape, 4 x 10 (in/yd) (Generic) 1 x Per Week/30 Days Discharge Instructions: Secure with tape as directed. 05/05/2022: The wound appears to have stalled. There is slough and some periwound eschar/callus. No concern for infection, however. I cleaned up the wound fairly aggressively in effort to stimulate the healing cascade again. We will continue to use endoform and total contact casting. Follow- up week. Electronic Signature(s) Signed: 05/05/2022 12:22:35 PM By: [name] Maudlin MD FACS Previous Signature: 05/05/2022 11:58:08 AM Version By: [name] Maudlin MD FACS Entered By: [name] on 05/05/2022 12:22:35 -------------------------------------------------------------------------------- HxROS Details Patient Name: Date of Service: [name], [place] 05/05/2022 11:00 A M Medical Record Number: [name] Patient Account Number: [id number] Date of Birth/Sex: Treating RN: Jun 10, 1976 (46 y.o. M) Primary Care Provider: Cristie [name] Other Clinician: Referring Provider: Treating Provider/Extender: [name] in Treatment: 77 Information Obtained From Patient Constitutional Symptoms (General Health) Medical History: Past Medical History Notes: COVID PNA 07/22/2019-11/14/2019  [facility] ECMO, foot drop left foot , Eyes Medical History: Negative for: Cataracts; Glaucoma; Optic Neuritis Ear/Nose/Mouth/Throat Medical History: Negative for: Chronic sinus problems/congestion; Middle ear problems Hematologic/Lymphatic Medical History: Negative for: Anemia; Hemophilia; Human Immunodeficiency Virus; Lymphedema; Sickle Cell Disease Respiratory Medical History: Positive for: Asthma Negative for: Aspiration; Chronic Obstructive Pulmonary Disease (COPD); Pneumothorax; Sleep Apnea; Tuberculosis Cardiovascular Medical History: Positive for: Angina - with COVID; Hypertension Negative for: Arrhythmia; Congestive Heart Failure; Coronary Artery Disease; Deep Vein Thrombosis; Hypotension; Myocardial Infarction; Peripheral Arterial Disease; Peripheral Venous Disease; Phlebitis; Vasculitis Gastrointestinal Medical History: Negative for: Cirrhosis ; Colitis; Crohns; Hepatitis A; Hepatitis B; Hepatitis C Endocrine Medical History: Negative for: Type I Diabetes; Type II Diabetes Genitourinary Medical History: Negative for: End Stage Renal Disease Past Medical History Notes: kidney stone Immunological Medical History: Negative for: Lupus  Erythematosus; Raynauds; Scleroderma Integumentary (Skin) Medical History: Negative for: History of Burn Musculoskeletal Medical History: Negative for: Gout; Rheumatoid Arthritis; Osteoarthritis; Osteomyelitis Neurologic Medical History: Negative for: Dementia; Neuropathy; Quadriplegia; Paraplegia; Seizure Disorder Oncologic Medical History: Negative for: Received Chemotherapy; Received Radiation Psychiatric Medical History: Negative for: Anorexia/bulimia; Confinement Anxiety Past Medical History Notes: anxiety Immunizations Pneumococcal Vaccine: Received Pneumococcal Vaccination: No Implantable Devices None Hospitalization / Surgery History Type of Hospitalization/Surgery COVID PNA 07/22/2019- 11/14/2019 03/27/2020 wound  debridement/ skin graft Family and Social History Cancer: Yes - Maternal Grandparents; Diabetes: Yes - Father,Paternal Grandparents; Heart Disease: Yes - Maternal Grandparents; Hereditary Spherocytosis: No; Hypertension: Yes - Father,Paternal Grandparents; Kidney Disease: No; Lung Disease: Yes - Siblings; Seizures: No; Stroke: No; Thyroid Problems: No; Tuberculosis: No; Never smoker; Marital Status - Married; Alcohol Use: Never; Drug Use: No History; Caffeine Use: Daily - tea, soda; Financial Concerns: No; Food, Clothing or Shelter Needs: No; Support System Lacking: No; Transportation Concerns: No Electronic Signature(s) Signed: 05/05/2022 12:21:30 PM By: [name] Maudlin MD FACS Entered By: [name] on 05/05/2022 11:54:59 -------------------------------------------------------------------------------- Total Contact Cast Details Patient Name: Date of Service: [name], [place] 05/05/2022 11:00 A M Medical Record Number: [id number] Patient Account Number: [id number] Date of Birth/Sex: Treating RN: 24-Nov-1975 (46 y.o. M) Primary Care Provider: Cristie [name] Other Clinician: Referring Provider: Treating Provider/Extender: [name] in Treatment: 53 T Contact Cast Applied for Wound Assessment: otal Wound #2 Left Calcaneus Performed By: Physician [name] Maudlin, MD Post Procedure Diagnosis Same as Pre-procedure Electronic Signature(s) Signed: 05/05/2022 12:22:08 PM By: [name] Maudlin MD FACS Entered By: [name] on 05/05/2022 12:22:08 -------------------------------------------------------------------------------- [place] Details Patient Name: Date of Service: [name], [place] 05/05/2022 Medical Record Number: [id number] Patient Account Number: [id number] Date of Birth/Sex: Treating RN: 20-Jul-1976 (46 y.o. M) Primary Care Provider: Cristie [name] Other Clinician: Referring Provider: Treating Provider/Extender: [name] in Treatment: 94 Diagnosis Coding ICD-10 Codes Code Description [phone] Non-pressure chronic ulcer of other part of left foot with fat layer exposed Facility Procedures CPT4 Code: [id number] Description: [phone] - APPLY TOTAL CONTACT LEG CAST ICD-10 Diagnosis Description ICD-10 Diagnosis Description L97.522 Non-pressure chronic ulcer of other part of left foot with fat layer exposed Modifier: Quantity: 1 Physician Procedures : CPT4 Code Description Modifier [id number] - WC PHYS LEVEL 3 - EST PT ICD-10 Diagnosis Description L97.522 Non-pressure chronic ulcer of other part of left foot with fat layer exposed Quantity: 1 : [id number] - WC PHYS APPLY TOTAL CONTACT CAST ICD-10 Diagnosis Description L97.522 Non-pressure chronic ulcer of other part of left foot with fat layer exposed Quantity: 1 Electronic Signature(s) Signed: 05/05/2022 11:59:56 AM By: [name] Maudlin MD FACS Entered By: [name] on 05/05/2022 11:59:56

## 2022-05-09 ENCOUNTER — Ambulatory Visit: Payer: PPO | Admitting: Podiatry

## 2022-05-09 DIAGNOSIS — L97522 Non-pressure chronic ulcer of other part of left foot with fat layer exposed: Secondary | ICD-10-CM | POA: Diagnosis not present

## 2022-05-10 NOTE — Progress Notes (Signed)
   HPI: 46 y.o. male presenting today to discuss possible fitting today for custom molded diabetic type accommodative orthotics.  Patient has a history of ulcers to the bilateral feet secondary to pressure created when he was induced in a medical coma for approximately 5 months secondary to COVID infection.  He currently presents today wearing a total contact cast to the left lower extremity.  Past Medical History:  Diagnosis Date   Anginal pain ([place])    with covid   Anxiety    Asthma    Dyspnea    GERD (gastroesophageal reflux disease)    Headache    History of kidney stones    LEFT URETERAL STONE   HTN (hypertension)    Pancreatitis 2018   GALLBALDDER SLUDGE CAUSED ISSUED RESOLVED   Pneumonia 07/2019   covid    Physical Exam: General: The patient is alert and oriented x3 in no acute distress.  Dermatology: Ulcer to the right plantar foot has completely healed and resolved.  Total contact cast intact left lower extremity  Neurovascular status intact  Musculoskeletal Exam: Total contact cast intact to the left lower extremity.  Assessment: 1.  Full-thickness pressure ulcers bilateral feet 2.  Pain in bilateral feet 3.  History of COVID-19 hospitalization.  Discharge 12/04/2019 4.  Status post debridement with application of skin substitute bilateral feet.  DOS: 03/27/2020   Plan of Care:  1. Patient evaluated.   2.  The total contact cast was left intact to the left lower extremity 3.  The patient is interested in custom molded diabetic type orthotics.  Recommend that the patient waits until the left foot has healed to be molded for a single pair of custom molded diabetic type accommodative orthotics 4.  In the meantime continue management at the [name] 5.  Her return to clinic as needed  Edrick Kins, DPM [facility]  Dr. Edrick Kins, DPM    [address], [place] [address]                 Office [phone]  Fax [phone]

## 2022-05-12 ENCOUNTER — Encounter (HOSPITAL_BASED_OUTPATIENT_CLINIC_OR_DEPARTMENT_OTHER): Payer: PPO | Attending: General Surgery | Admitting: General Surgery

## 2022-05-12 ENCOUNTER — Telehealth: Payer: Self-pay

## 2022-05-12 DIAGNOSIS — L97512 Non-pressure chronic ulcer of other part of right foot with fat layer exposed: Secondary | ICD-10-CM | POA: Insufficient documentation

## 2022-05-12 DIAGNOSIS — L89613 Pressure ulcer of right heel, stage 3: Secondary | ICD-10-CM | POA: Diagnosis not present

## 2022-05-12 DIAGNOSIS — L89623 Pressure ulcer of left heel, stage 3: Secondary | ICD-10-CM | POA: Diagnosis not present

## 2022-05-12 DIAGNOSIS — L97522 Non-pressure chronic ulcer of other part of left foot with fat layer exposed: Secondary | ICD-10-CM | POA: Insufficient documentation

## 2022-05-12 NOTE — Telephone Encounter (Signed)
(  Patient is aware Dr. Dagoberto Ligas is not in the office today).   [name] wanted to know if Dr. Dagoberto Ligas would send in a script for Oxycodone. If granted please send to [facility] [address] in [place].

## 2022-05-13 ENCOUNTER — Telehealth: Payer: Self-pay

## 2022-05-13 DIAGNOSIS — L97512 Non-pressure chronic ulcer of other part of right foot with fat layer exposed: Secondary | ICD-10-CM | POA: Diagnosis not present

## 2022-05-13 MED ORDER — OXYCODONE HCL 5 MG PO TABS: 5.0000 mg | ORAL_TABLET | Freq: Four times a day (QID) | ORAL | 0 refills | Status: DC | PRN

## 2022-05-13 NOTE — Telephone Encounter (Signed)
Having a lot of in hips, knee and LLE form cast for L foot.   Wants to try Oxycodone again- helps pain- Cast- don't know how long will be on. Could be until end of year.   Had a setback yesterday- R foot opened back up some yesterday- depressed/down about it.   Will send in Oxycodone 5 mg 2x/day as needed- can break in half if need be- explained that to pt.   Will continue for now at least until gets cast off.

## 2022-05-13 NOTE — Telephone Encounter (Signed)
[facility] for O[facility]codone 5 MG. Per HTA no PA needed.   Patient will get the 7 day supply. He is advised to call back for the 30 day supply.   Since you will be out of the office next week. Is it possible to place the order & put a hold on it?

## 2022-05-13 NOTE — Addendum Note (Signed)
Addended by: Courtney Heys on: 05/13/2022 09:30 AM   Modules accepted: Orders

## 2022-05-17 NOTE — Progress Notes (Signed)
TIGE, COSTILOW ([id number]) [id number] Page 1 of 8 Visit Report for 05/12/2022 Arrival Information Details Patient Name: Date of Service: [name], [place] 05/12/2022 9:30 A M Medical Record Number: [id number] Patient Account Number: [id number] Date of Birth/Sex: Treating RN: 30-Nov-1975 (46 y.o. [name] Primary Care Danique Hartsough: [name] Other Clinician: Referring Melaney Tellefsen: Treating Kendi Defalco/Extender: [name] in Treatment: 95 Visit Information History Since Last Visit Added or deleted any medications: No Patient Arrived: Wheel Chair Any new allergies or adverse reactions: No Arrival Time: 10:01 Had a fall or experienced change in No Accompanied By: wife activities of daily living that may affect Transfer Assistance: None risk of falls: Patient Identification Verified: Yes Signs or symptoms of abuse/neglect since last visito No Secondary Verification Process Completed: Yes Hospitalized since last visit: No Patient Requires Transmission-Based Precautions: No Implantable device outside of the clinic excluding No Patient Has Alerts: No cellular tissue based products placed in the center since last visit: Has Dressing in Place as Prescribed: Yes Has Footwear/Offloading in Place as Prescribed: Yes Left: T Contact Cast otal Pain Present Now: Yes Electronic Signature(s) Signed: 05/17/2022 3:12:14 PM By: Sharyn Creamer RN, BSN Entered By: Sharyn Creamer on 05/12/2022 10:01:57 -------------------------------------------------------------------------------- Encounter Discharge Information Details Patient Name: Date of Service: [name], [place]. 05/12/2022 9:30 A M Medical Record Number: [id number] Patient Account Number: [id number] Date of Birth/Sex: Treating RN: 12-May-1976 (46 y.o. [name] Primary Care Rudransh Bellanca: [name] Other Clinician: Referring Takeria Marquina: Treating Tarynn Garling/Extender: [name] in Treatment: 95 Encounter Discharge Information Items Post Procedure Vitals Discharge Condition: Stable Temperature (F): 97.9 Ambulatory Status: Wheelchair Pulse (bpm): 69 Discharge Destination: Home Respiratory Rate (breaths/min): 18 Transportation: Private Auto Blood Pressure (mmHg): 145/84 Accompanied By: wife Schedule Follow-up Appointment: Yes Clinical Summary of Care: Patient Declined Electronic Signature(s) Signed: 05/17/2022 3:12:14 PM By: Sharyn Creamer RN, BSN Entered By: Sharyn Creamer on 05/12/2022 12:01:05 [name] ([id number]) [id number] Page 2 of 8 -------------------------------------------------------------------------------- Lower Extremity Assessment Details Patient Name: Date of Service: [name] 05/12/2022 9:30 A M Medical Record Number: [id number] Patient Account Number: [id number] Date of Birth/Sex: Treating RN: January 28, 1976 (46 y.o. [name] Primary Care Jayli Fogleman: [name] Other Clinician: Referring Balraj Brayfield: Treating Novice Vrba/Extender: [name] in Treatment: 95 Edema Assessment Assessed: [name]: No] [name]: No] Edema: [Left: N] [Right: o] Calf Left: Right: Point of Measurement: 29 cm From Medial Instep 41.7 cm 43.7 cm Ankle Left: Right: Point of Measurement: 9 cm From Medial Instep 25.4 cm 24 cm Vascular Assessment Pulses: Dorsalis Pedis Palpable: [Left:Yes] [Right:Yes] Electronic Signature(s) Signed: 05/17/2022 3:12:14 PM By: Sharyn Creamer RN, BSN Entered By: Sharyn Creamer on 05/12/2022 09:54:40 -------------------------------------------------------------------------------- Multi Wound Chart Details Patient Name: Date of Service: [name], [place] 05/12/2022 9:30 A M Medical Record Number: [id number] Patient Account Number: [id number] Date of Birth/Sex: Treating RN: Mar 17, 1976 (46 y.o. M) Primary Care Misty Foutz: [name] Other Clinician: Referring  Beatric Fulop: Treating Jionni Helming/Extender: [name] in Treatment: 95 Vital Signs Height(in): 69 Pulse(bpm): 34 Weight(lbs): 280 Blood Pressure(mmHg): 145/84 Body Mass Index(BMI): 41.3 Temperature(F): 97.9 Respiratory Rate(breaths/min): 18 [1:Photos: No Photos Right Calcaneus Wound Location: Pressure Injury Wounding Event: Pressure Ulcer Primary Etiology: Asthma, Angina, Hypertension Comorbid History: 10/07/2019 Date Acquired: 95 Weeks of Treatment: Open Wound Status: No Wound Recurrence: 1x0.3x0.5  Measurements L x W x D (cm) 0.236 A (cm) : rea 0.118 Volume (cm) :] [2:No Photos Left Calcaneus Pressure Injury Pressure  Ulcer Asthma, Angina, Hypertension 10/07/2019 95 Open No 3x0.5x0.2 1.178 0.236] [N/A:N/A N/A N/A N/A N/A N/A N/A N/A N/A N/A N/A N/A] AMANDEEP, NESMITH ([id number]) [1:99.30% % Reduction in A rea: 99.60% % Reduction in Volume: Category/Stage III Classification: None Present Exudate A mount: N/A Exudate Type: N/A Exudate Color: Thickened Wound Margin: Large (67-100%) Granulation A mount: Red, Pink Granulation  Quality: Small (1-33%) Necrotic A mount: Adherent Slough Necrotic Tissue: Fat Layer (Subcutaneous Tissue): Yes Fat Layer (Subcutaneous Tissue): Yes N/A Exposed Structures: Fascia: No Tendon: No Muscle: No Joint: No Bone: No Small (1-33%)  Epithelialization: Debridement - Excisional Debridement: Pre-procedure Verification/Time Out 10:13 Taken: Lidocaine 5% topical ointment Pain Control: Necrotic/Eschar, Subcutaneous, Tissue Debrided: Slough Skin/Subcutaneous Tissue Level: 0.3 Debridement A  (sq cm): rea Curette Instrument: Minimum Bleeding: Pressure Hemostasis Achieved: 0 Procedural Pain: 0 Post Procedural Pain: Debridement Treatment Response: Procedure was tolerated well Post Debridement Measurements L x 1x0.3x0.5 W x D (cm) 0.118 Post  Debridement Volume: (cm) Category/Stage III Post Debridement Stage: Callus: Yes Periwound Skin Texture: No Abnormalities  Noted Periwound Skin Moisture: No Abnormalities Noted Periwound Skin Color: No Abnormality Temperature: Debridement Procedures  Performed:] [2:95.70% 99.10% Category/Stage III Medium Serosanguineous red, brown Thickened Small (1-33%) Red Large (67-100%) Eschar, Adherent Slough Fascia: No Tendon: No Muscle: No Joint: No Bone: No Small (1-33%) Debridement - Excisional 10:13  Lidocaine 5% topical ointment Necrotic/Eschar, Subcutaneous, Slough Skin/Subcutaneous Tissue 1.5 Curette Minimum Pressure 0 0 Procedure was tolerated well 3x0.5x0.2 0.236 Category/Stage III No Abnormalities Noted Maceration: Yes No Abnormalities Noted No  Abnormality Debridement] [N/A:[id number] Page 3 of 8 N/A N/A N/A N/A N/A N/A N/A N/A N/A N/A N/A N/A N/A N/A N/A N/A N/A N/A N/A N/A N/A N/A N/A N/A N/A N/A N/A N/A N/A N/A N/A N/A] Treatment Notes Electronic Signature(s) Signed: 05/12/2022 10:29:01 AM By: Fredirick Maudlin MD FACS Entered By: Fredirick Maudlin on 05/12/2022 10:29:01 -------------------------------------------------------------------------------- Multi-Disciplinary Care Plan Details Patient Name: Date of Service: [name], [place] 05/12/2022 9:30 A M Medical Record Number: [id number] Patient Account Number: [id number] Date of Birth/Sex: Treating RN: [birth year] (46 y.o. [name] Primary Care Azlan Hanway: [name] Other Clinician: Referring Berthel Bagnall: Treating Shantee Hayne/Extender: [name] in Treatment: [place] reviewed with physician Active Inactive Wound/Skin Impairment Nursing Diagnoses: Impaired tissue integrity Knowledge deficit related to ulceration/compromised skin integrity Goals: Patient/caregiver will verbalize understanding of skin care regimen Date Initiated: 07/15/2020 Target Resolution Date: 07/07/2022 MALONE, VANBLARCOM ([id number]) [id number] Page 4 of 8 Goal Status: Active Ulcer/skin  breakdown will have a volume reduction of 30% by week 4 Date Initiated: 07/15/2020 Date Inactivated: 08/20/2020 Target Resolution Date: 09/03/2020 Goal Status: Unmet Unmet Reason: no major changes. Ulcer/skin breakdown will heal within 14 weeks Date Initiated: 12/04/2020 Date Inactivated: 12/10/2020 Target Resolution Date: 12/10/2020 Unmet Reason: wounds still open at 14 Goal Status: Unmet weeks and today 21 weeks. Interventions: Assess patient/caregiver ability to obtain necessary supplies Assess patient/caregiver ability to perform ulcer/skin care regimen upon admission and as needed Assess ulceration(s) every visit Provide education on ulcer and skin care Treatment Activities: Skin care regimen initiated : 07/15/2020 Topical wound management initiated : 07/15/2020 Notes: Electronic Signature(s) Signed: 05/17/2022 3:12:14 PM By: Sharyn Creamer RN, BSN Entered By: Sharyn Creamer on 05/12/2022 11:58:43 -------------------------------------------------------------------------------- Pain Assessment Details Patient Name: Date of Service: [name], [place] 05/12/2022 9:30 A M Medical Record Number: [id number] Patient Account Number: [id number] Date of Birth/Sex: Treating RN: 07/02/1976 (46 y.o. [name] Primary Care Sheriece Jefcoat: Jacalyn Lefevre,  Mickel Baas Other Clinician: Referring Robin Petrakis: Treating Caydin Yeatts/Extender: [name] in Treatment: 95 Active Problems Location of Pain Severity and Description of Pain Patient Has Paino Yes Site Locations Rate the pain. Current Pain Level: 4 Pain Management and Medication Current Pain Management: Electronic Signature(s) Signed: 05/17/2022 3:12:14 PM By: Sharyn Creamer RN, BSN Entered By: Sharyn Creamer on 05/12/2022 09:41:46 [name] ([id number]) [id number] Page 5 of 8 -------------------------------------------------------------------------------- Patient/Caregiver Education Details Patient  Name: Date of Service: [name] [place] 10/5/2023andnbsp9:30 A M Medical Record Number: [id number] Patient Account Number: [id number] Date of Birth/Gender: Treating RN: 1975/11/09 (46 y.o. [name] Primary Care Physician: [name] Other Clinician: Referring Physician: Treating Physician/Extender: [name] in Treatment: 95 Education Assessment Education Provided To: Patient Education Topics Provided Wound/Skin Impairment: Methods: Explain/Verbal Responses: State content correctly [employer]) Signed: 05/17/2022 3:12:14 PM By: Sharyn Creamer RN, BSN Entered By: Sharyn Creamer on 05/12/2022 11:59:03 -------------------------------------------------------------------------------- Wound Assessment Details Patient Name: Date of Service: [name], TO [place] 05/12/2022 9:30 A M Medical Record Number: [id number] Patient Account Number: [id number] Date of Birth/Sex: Treating RN: [birth year] (46 y.o. [name] Primary Care Maekayla Giorgio: [name] Other Clinician: Referring Heide Brossart: Treating Chiann Goffredo/Extender: [name] in Treatment: 95 Wound Status Wound Number: 1 Primary Etiology: Pressure Ulcer Wound Location: Right Calcaneus Wound Status: Open Wounding Event: Pressure Injury Comorbid History: Asthma, Angina, Hypertension Date Acquired: 10/07/2019 Weeks Of Treatment: 95 Clustered Wound: No Wound Measurements Length: (cm) 1 Width: (cm) 0.3 Depth: (cm) 0.5 Area: (cm) 0.236 Volume: (cm) 0.118 % Reduction in Area: 99.3% % Reduction in Volume: 99.6% Epithelialization: Small (1-33%) Tunneling: No Undermining: No Wound Description Classification: Category/Stage III Wound Margin: Thickened Exudate Amount: None Present Foul Odor After Cleansing: No Slough/Fibrino Yes Wound Bed Granulation Amount: Large (67-100%) Exposed Structure Granulation Quality: Red, Pink Fascia Exposed: No Necrotic Amount:  Small (1-33%) Fat Layer (Subcutaneous Tissue) Exposed: Yes SHONTEZ, RINDAL ([id number]) [id number] Page 6 of 8 Necrotic Quality: Adherent Slough Tendon Exposed: No Muscle Exposed: No Joint Exposed: No Bone Exposed: No Periwound Skin Texture Texture Color No Abnormalities Noted: No No Abnormalities Noted: Yes Callus: Yes Temperature / Pain Temperature: No Abnormality Moisture No Abnormalities Noted: Yes Treatment Notes Wound #1 (Calcaneus) Wound Laterality: Right Cleanser Normal Saline Discharge Instruction: Cleanse the wound with Normal Saline prior to applying a clean dressing using gauze sponges, not tissue or cotton balls. Wound Cleanser Discharge Instruction: Cleanse the wound with wound cleanser prior to applying a clean dressing using gauze sponges, not tissue or cotton balls. Peri-Wound Care Topical Primary Dressing Endoform 2x2 in Discharge Instruction: Moisten with saline Secondary Dressing Optifoam Non-Adhesive Dressing, 4x4 in Discharge Instruction: Apply over primary dressing as directed. Woven Gauze Sponge, Non-Sterile 4x4 in Discharge Instruction: Apply over primary dressing as directed. Secured With 92M Medipore H Soft Cloth Surgical T ape, 4 x 10 (in/yd) Discharge Instruction: Secure with tape as directed. Compression Wrap Compression Stockings Add-Ons Electronic Signature(s) Signed: 05/17/2022 3:12:14 PM By: Sharyn Creamer RN, BSN Entered By: Sharyn Creamer on 05/12/2022 10:00:14 -------------------------------------------------------------------------------- Wound Assessment Details Patient Name: Date of Service: [name], [place] 05/12/2022 9:30 A M Medical Record Number: [id number] Patient Account Number: [id number] Date of Birth/Sex: Treating RN: [birth year] (46 y.o. [name] Primary Care Bahja Bence: [name] Other Clinician: Referring Jalin Erpelding: Treating Weda Baumgarner/Extender: [name] in  Treatment: 95 Wound Status Wound Number: 2 Primary Etiology: Pressure Ulcer Wound Location: Left Calcaneus Wound Status: Open  Wounding Event: Pressure Injury Comorbid History: Asthma, Angina, Hypertension Date Acquired: 10/07/2019 Weeks Of Treatment: 95 Clustered Wound: No CODYJAMES, SALAMI ([id number]) [id number] Page 7 of 8 Wound Measurements Length: (cm) 3 Width: (cm) 0.5 Depth: (cm) 0.2 Area: (cm) 1.178 Volume: (cm) 0.236 % Reduction in Area: 95.7% % Reduction in Volume: 99.1% Epithelialization: Small (1-33%) Tunneling: No Undermining: No Wound Description Classification: Category/Stage III Wound Margin: Thickened Exudate Amount: Medium Exudate Type: Serosanguineous Exudate Color: red, brown Foul Odor After Cleansing: No Slough/Fibrino Yes Wound Bed Granulation Amount: Small (1-33%) Exposed Structure Granulation Quality: Red Fascia Exposed: No Necrotic Amount: Large (67-100%) Fat Layer (Subcutaneous Tissue) Exposed: Yes Necrotic Quality: Eschar, Adherent Slough Tendon Exposed: No Muscle Exposed: No Joint Exposed: No Bone Exposed: No Periwound Skin Texture Texture Color No Abnormalities Noted: Yes No Abnormalities Noted: Yes Moisture Temperature / Pain No Abnormalities Noted: No Temperature: No Abnormality Maceration: Yes Treatment Notes Wound #2 (Calcaneus) Wound Laterality: Left Cleanser Normal Saline Discharge Instruction: Cleanse the wound with Normal Saline prior to applying a clean dressing using gauze sponges, not tissue or cotton balls. Wound Cleanser Discharge Instruction: Cleanse the wound with wound cleanser prior to applying a clean dressing using gauze sponges, not tissue or cotton balls. Peri-Wound Care Topical Primary Dressing Endoform 2x2 in Discharge Instruction: Moisten with saline Secondary Dressing Optifoam Non-Adhesive Dressing, 4x4 in Discharge Instruction: Apply over primary dressing as directed. Woven  Gauze Sponge, Non-Sterile 4x4 in Discharge Instruction: Apply over primary dressing as directed. Secured With 19M Medipore H Soft Cloth Surgical T ape, 4 x 10 (in/yd) Discharge Instruction: Secure with tape as directed. Compression Wrap Compression Stockings Add-Ons Electronic Signature(s) Signed: 05/17/2022 3:12:14 PM By: Sharyn Creamer RN, BSN Entered By: Sharyn Creamer on 05/12/2022 10:01:13 [name] ([id number]) [id number] Page 8 of 8 -------------------------------------------------------------------------------- Vitals Details Patient Name: Date of Service: [name], [place] 05/12/2022 9:30 A M Medical Record Number: [id number] Patient Account Number: [id number] Date of Birth/Sex: Treating RN: 03/23/1976 (46 y.o. [name] Primary Care Kyle Stansell: [name] Other Clinician: Referring Debbra Digiulio: Treating Julies Carmickle/Extender: [name] in Treatment: 95 Vital Signs Time Taken: 09:41 Temperature (F): 97.9 Height (in): 69 Pulse (bpm): 69 Weight (lbs): 280 Respiratory Rate (breaths/min): 18 Body Mass Index (BMI): 41.3 Blood Pressure (mmHg): 145/84 Reference Range: 80 - 120 mg / dl Electronic Signature(s) Signed: 05/17/2022 3:12:14 PM By: Sharyn Creamer RN, BSN Entered By: Sharyn Creamer on 05/12/2022 09:41:20

## 2022-05-17 NOTE — Progress Notes (Signed)
CARLIN, ATTRIDGE ([id number]) [id number] Page 1 of 17 Visit Report for 05/12/2022 Chief Complaint Document Details Patient Name: Date of Service: [name] [name], TO [name] 05/12/2022 9:30 A M Medical Record Number: [id number] Patient Account Number: [id number] Date of Birth/Sex: Treating RN: Sep 01, 1975 (46 y.o. M) Primary Care Provider: Dorinda Hill Other Clinician: Referring Provider: Treating Provider/Extender: Jana Hakim in Treatment: 95 Information Obtained from: Patient Chief Complaint Bilateral Plantar Foot Ulcers Electronic Signature(s) Signed: 05/12/2022 10:29:07 AM By: [name] MD FACS Entered By: [name] on 05/12/2022 10:29:07 -------------------------------------------------------------------------------- Debridement Details Patient Name: Date of Service: [name] [name], [name] 05/12/2022 9:30 A M Medical Record Number: [id number] Patient Account Number: [id number] Date of Birth/Sex: Treating RN: Dec 03, 1975 (46 y.o. [name] [name] Primary Care Provider: Dorinda Hill Other Clinician: Referring Provider: Treating Provider/Extender: Jana Hakim in Treatment: 95 Debridement Performed for Assessment: Wound #1 Right Calcaneus Performed By: Physician [name], MD Debridement Type: Debridement Level of Consciousness (Pre-procedure): Awake and Alert Pre-procedure Verification/Time Out Yes - 10:13 Taken: Start Time: 10:13 Pain Control: Lidocaine 5% topical ointment T Area Debrided (L x W): otal 1 (cm) x 0.3 (cm) = 0.3 (cm) Tissue and other material debrided: Non-Viable, Eschar, Slough, Subcutaneous, Slough Level: Skin/Subcutaneous Tissue Debridement Description: Excisional Instrument: Curette Specimen: Tissue Culture Number of Specimens T aken: 1 Bleeding: Minimum Hemostasis Achieved: Pressure Procedural Pain: 0 Post Procedural Pain: 0 Response to Treatment: Procedure was tolerated  well Level of Consciousness (Post- Awake and Alert procedure): Post Debridement Measurements of Total Wound Length: (cm) 1 Stage: Category/Stage III Width: (cm) 0.3 Depth: (cm) 0.5 Volume: (cm) 0.118 Character of Wound/Ulcer Post Debridement: Improved CONNELL, BOGNAR ([id number]) [id number] Page 2 of 17 Post Procedure Diagnosis Same as Pre-procedure Electronic Signature(s) Signed: 05/12/2022 10:51:15 AM By: [name] MD FACS Signed: 05/17/2022 3:12:14 PM By: Redmond Pulling RN, BSN Entered By: Redmond Pulling on 05/12/2022 10:22:27 -------------------------------------------------------------------------------- Debridement Details Patient Name: Date of Service: [name] [name], [name] 05/12/2022 9:30 A M Medical Record Number: [id number] Patient Account Number: [id number] Date of Birth/Sex: Treating RN: Oct 21, 1975 (46 y.o. [name] [name] Primary Care Provider: Dorinda Hill Other Clinician: Referring Provider: Treating Provider/Extender: Jana Hakim in Treatment: 95 Debridement Performed for Assessment: Wound #2 Left Calcaneus Performed By: Physician [name], MD Debridement Type: Debridement Level of Consciousness (Pre-procedure): Awake and Alert Pre-procedure Verification/Time Out Yes - 10:13 Taken: Start Time: 10:13 Pain Control: Lidocaine 5% topical ointment T Area Debrided (L x W): otal 3 (cm) x 0.5 (cm) = 1.5 (cm) Tissue and other material debrided: Non-Viable, Eschar, Slough, Subcutaneous, Slough Level: Skin/Subcutaneous Tissue Debridement Description: Excisional Instrument: Curette Specimen: Tissue Culture Number of Specimens T aken: 1 Bleeding: Minimum Hemostasis Achieved: Pressure Procedural Pain: 0 Post Procedural Pain: 0 Response to Treatment: Procedure was tolerated well Level of Consciousness (Post- Awake and Alert procedure): Post Debridement Measurements of Total Wound Length: (cm) 3 Stage:  Category/Stage III Width: (cm) 0.5 Depth: (cm) 0.2 Volume: (cm) 0.236 Character of Wound/Ulcer Post Debridement: Improved Post Procedure Diagnosis Same as Pre-procedure Electronic Signature(s) Signed: 05/12/2022 10:51:15 AM By: [name] MD FACS Signed: 05/17/2022 3:12:14 PM By: Redmond Pulling RN, BSN Entered By: Redmond Pulling on 05/12/2022 10:22:50 -------------------------------------------------------------------------------- HPI Details Patient Name: Date of Service: [name] [name], [name] 05/12/2022 9:30 A M Medical Record Number: [id number] Patient Account Number: [id number] [name] [name] ([id number]) [id number] Page 3 of 17 Date of Birth/Sex: Treating RN: Dec 16, 1975 (46 y.o. M) Primary Care Provider:  Other Clinician: [name] Referring Provider: Treating Provider/Extender: [name] in Treatment: 95 History of Present Illness HPI Description: Wounds are12/03/2020 upon evaluation today patient presents for initial inspection here in our clinic concerning issues he has been having with the bottoms of his feet bilaterally. He states these actually occurred as wounds when he was hospitalized for 5 months secondary to Covid. He was apparently with tilting bed where he was in an upright position quite frequently and apparently this occurred in some way shape or form during that time. Fortunately there is no sign of active infection at this time. No fevers, chills, nausea, vomiting, or diarrhea. With that being said he still has substantial wounds on the plantar aspects of his feet Theragen require quite a bit of work to get these to heal. He has been using Santyl currently though that is been problematic both in receiving the medication as well as actually paid for it as it is become quite expensive. Prior to the experience with Covid the patient really did not have any major medical problems other than hypertension he does  have some mild generalized weakness following the Covid experience. 07/22/2020 on evaluation today patient appears to be doing okay in regard to his foot ulcers I feel like the wound beds are showing signs of better improvement that I do believe the Iodoflex is helping in this regard. With that being said he does have a lot of drainage currently and this is somewhat blue/green in nature which is consistent with Pseudomonas. I do think a culture today would be appropriate for [place] to evaluate and see if that is indeed the case I would likely start him on antibiotic orally as well he is not allergic to Cipro knows of no issues he has had in the past 12/21; patient was admitted to the clinic earlier this month with bilateral presumed pressure ulcers on the bottom of his feet apparently related to excessive pressure from a tilt table arrangement in the intensive care unit. Patient relates this to being on ECMO but I am not really sure that is exactly related to that. I must say I have never seen anything like this. He has fairly extensive full-thickness wounds extending from his heel towards his midfoot mostly centered laterally. There is already been some healing distally. He does not appear to have an arterial issue. He has been using gentamicin to the wound surfaces with Iodoflex to help with ongoing debridement 1/6; this is a patient with pressure ulcers on the bottom of his feet related to excessive pressure from a standing position in the intensive care unit. He is complaining of a lot of pain in the right heel. He is not a diabetic. 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 We have been using Iodoflex to help prepare the surfaces of both wounds for an advanced treatment product. He is nonambulatory spending most of his time in a wheelchair I have asked him not to propel the wheelchair with his heels 1/13; in general his wounds look better not much surface area change we have  been using Iodoflex as of last week. I did an x-ray of the right heel as the patient was complaining of pain. I had some thoughts about a stress fracture perhaps Achilles tendon problems however what it showed was erosive changes along the inferior aspect of the calcaneus he now has a MRI booked for 1/20. 1/20; in general his wounds continue to be better. Some improvement  in the large narrow areas proximally in his foot. He is still complaining of pain in the right heel and tenderness in certain areas of this wound. His MRI is tonight. I am not just looking for osteomyelitis that was brought up on the x-ray I am wondering about stress fractures, tendon ruptures etc. He has no such findings on the left. Also noteworthy is that the patient had critical illness neuropathy and some of the discomfort may be actual improvement in nerve function I am just not sure. These wounds were initially in the setting of severe critical illness related to COVID-19. He was put in a standing position. He may have also been on pressors at the point contributing to tissue ischemia. By his description at some point these wounds were grossly necrotic extending proximally up into the Achilles part of his heel. I do not know that I have ever really seen pictures of them like this although they may exist in epic We have ordered Tri layer Oasis. I am trying to stimulate some granulation in these areas. This is of course assuming the MRI is negative for infection 1/27; since the patient was last here he saw Dr. Juleen China of infectious disease. He is planned for vancomycin and ceftriaxone. Prior operative culture grew MSSA. Also ordered baseline lab work. He also ordered arterial studies although the ABIs in our clinic were normal as well as his clinical exam these were normal I do not think he needs to see vascular surgery. His ABIs at the [facility] were 1.22 in the right triphasic waveforms with a normal TBI of 1.15 on the left ABI of  1.22 with triphasic waveforms and a normal TBI of 1.08. Finally he saw Dr. Amalia Hailey who will follow him in for 2 months. At this point I do not think he felt that he needed a procedure on the right calcaneal bone. Dr. Juleen China is elected for broad-spectrum antibiotic The patient is still having pain in the right heel. He walks with a walker 2/3; wounds are generally smaller. He is tolerating his IV antibiotics. I believe this is vancomycin and ceftriaxone. We are still waiting for Oasis burn in terms of his out-of-pocket max which he should be meeting soon given the IV antibiotics, MRIs etc. I have asked him to check in on this. We are using silver collagen in the meantime the wounds look better 2/10; tolerating IV vancomycin and Rocephin. We are waiting to apply for Oasis. Although I am not really sure where he is in his out-of-pocket max. 2/17 started the first application of Oasis trilayer. Still on antibiotics. The wounds have generally look better. The area on the left has a little more surface slough requiring debridement 8/03; second application of Oasis trilayer. The wound surface granulation is generally look better. The area on the left with undermining laterally I think is come in a bit. 10/08/2020 upon evaluation today patient is here today for [employer] application #3. Fortunately he seems to be doing extremely well with regard to this and we are seeing a lot of new epithelial growth which is great news. Fortunately there is no signs of active infection at this time. 10/16/2020 upon evaluation today patient appears to be doing well with regard to his foot ulcers. Do believe the Oasis has been of benefit for him. I do not see any signs of infection right now which is great news and I think that he has a lot of new epithelial growth which is great to see  as well. The patient is very pleased to hear all of this. I do think we can proceed with the Oasis trilayer #4 today. 3/18; not as much  improvement in these areas on his heels that I was hoping. I did reapply trilateral Oasis today the tissue looks healthier but not as much fill in as I was hoping. 3/25; better looking today I think this is come in a bit the tissue looks healthier. Triple layer Oasis reapplied #6 4/1; somewhat better looking definitely better looking surface not as much change in surface area as I was hoping. He may be spending more time [place] on days then he needs to although he does have heel offloading boots. Triple layer Oasis reapplied #7 4/7; unfortunately apparently [facility] will not approve any further Oasis which is unfortunate since the patient did respond nicely both in terms of the condition of the wound bed as well as surface area. There is still some drainage coming from the wound but not a lot there does not appear to be any infection 4/15; we have been using Hydrofera Blue. He continues to have nice rims of epithelialization on the right greater than the left. The left the epithelialization is coming from the tip of his heel. There is moderate drainage. In this that concerns me about a total contact cast. There is no evidence of infection 4/29; patient has been using Hydrofera Blue with dressing changes. He has no complaints or issues today. 5/5; using Hydrofera Blue. I actually think that he looks marginally better than the last time I saw this 3 weeks ago. There are rims of epithelialization on the left thumb coming from the medial side on the right. Using Hydrofera Blue 5/12; using Hydrofera Blue. These continue to make improvements in surface area. His drainage was not listed as severe I therefore went ahead and put a cast on the left foot. Right foot we will continue to dress his previous 5/16; back for first total contact cast change. He did not tolerate this particularly well cast injury on the anterior tibia among other issues. Difficulty sleeping. I talked him about this in some  detail and afterwards is elected to continue. I told him I would like to have a cast on for 3 weeks to see if this is going to help at all. I think he agreed FANNIE, MONASTRA ([id number]) [id number] Page 4 of 17 5/19; I think the wound is better. There is no tunneling towards his midfoot. The undermining medially also looks better. He has a rim of new skin distally. I think we are making progress here. The area on the left also continues to look somewhat better to me using Hydrofera Blue. He has a list of complaints about the cast but none of them seem serious 5/26; patient presents for 1 week follow-up. He has been using a total contact cast and tolerating this well. Hydrofera Blue is the main dressing used. He denies signs of infection. 6/2 Hydrofera Blue total contact cast on the left. These were large ulcers that formed in intensive care unit where the patient was recovering from [place]. May have had something to do with being ventilated in an upright positiono Pressors etc. We have been able to get the areas down considerably and a viable surface. There is some epithelialization in both sides. Note made of drainage 6/9; changed to [facility] last time because of drainage. He arrives with better looking surfaces and dimensions on the left than the  right. Paradoxically the right actually probes more towards his midfoot the left is largely close down but both of these look improved. Using a total contact cast on the left 6/16; complex wounds on his bilateral plantar heels which were initially pressure injury from a stay in the ICU with COVID. We have been using silver alginate most recently. His dimensions of come in quite dramatically however not recently. We have been putting the left foot in a total contact cast 6/23; complex wounds on the bilateral plantar heels. I been putting the left in the cast paradoxically the area on the right is the one that is going towards  closure at a faster rate. Quite a bit of drainage on the left. The patient went to see Dr. Amalia Hailey who said he was going to standby for skin grafts. I had actually considered sending him for skin grafts however he would be mandatorily off his feet for a period of weeks to months. I am thinking that the area on the right is going to close on its own the area on the left has been more stubborn even though we have him in a total contact cast 6/30; took him out of a total contact cast last week is the right heel seem to be making better progress than the left where I was placing the cast. We are using silver alginate. Both wounds are smaller right greater than left 7/12; both wounds look as though they are making some progress. We are using silver alginate. Heel offloading boots 7/26; very gradual progress especially on the right. Using silver alginate. He is wearing heel offloading boots 8/18; he continues to close these wounds down very gradually. Using silver alginate. The problem polymen being definitive about this is areas of what appears to be callus around the margins. This is not a 100% of the area but certainly sizable especially on the right 9/1; bilateral plantar feet wounds secondary to prolonged pressure while being ventilated for COVID-19 in an upright position. Essentially pressure ulcers on the bottom of his feet. He is made substantial progress using silver alginate. 9/14; bilateral plantar feet wounds secondary to prolonged pressure. Making progress using silver alginate. 9/29 bilateral plantar feet wounds secondary to prolonged pressure. I changed him to Iodoflex last week. MolecuLight showing reddened blush fluorescence 10/11; patient presents for follow-up. He has no issues or complaints today. He denies signs of infection. He continues to use Iodoflex and antibiotic ointment to the wound beds. 10/27; 2-week follow-up. No evidence of infection. He has callus and thick dry skin around  the wound margins we have been using Iodoflex and Bactroban which was in response to a moderate left MolecuLight reddish blush fluorescence. 11/10; 2-week follow-up. Wound margins again have thick callus however the measurements of the actual wound sites are a lot smaller. Everything looks reasonably healthy here. We have been using Iodoflex He was approved for prime matrix but I have elected to delay this given the improvement in the surface area. Hopefully I will not regret that decision as were getting close to the end of the year in terms of insurance payment 12/8; 2-week follow-up. Wounds are generally smaller in size. These were initially substantial wounds extending into the forefoot all the way into the heel on the bilateral plantar feet. They are now both located on the plantar heel distal aspect both of these have a lot of callus around the wounds I used a #5 curette to remove this on the right and the left  also some subcutaneous debris to try and get the wound edges were using Iodoflex. He has heel offloading shoe 12/22; 2-week follow-up. Not really much improvement. He has thick callus around the outer edges of both wounds. I remove this there is some nonviable subcutaneous tissue as well. We have been using Iodoflex. Her intake nurse and myself spontaneously thought of a total contact cast I went back in May. At that time we really were not seeing much of an improvement with a cast although the wound was in a much different situation I would like to retry this in 2 weeks and I discussed this with the patient 08/12/2021; the patient has had some improvement with the Iodoflex. The the area on the left heel plantar more improved than the right. I had to put him in a total contact cast on the left although I decided to put that off for 2 weeks. I am going to change his primary dressing to silver collagen. I think in both areas he has had some improvement most of the healing seems to be more  proximal in the heel. The wounds are in the mid aspect. A lot of thick callus on the right heel however. 1/19; we are using silver collagen on both plantar heel areas. He has had some improvement today. The left did not require any debridement. He still had some eschar on the right that was debrided but both seem to have contracted. I did not put it total contact cast on him today 2/2 we have been using silver collagen. The area on the right plantar heel has areas that appear to be epithelialized interspersed with dry flaking callus and dry skin. I removed this. This really looks better than on the other side. On the left still a large area with raised edges and debris on the surface. 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 He has no issues or complaints today. 2/9; not much change in the left foot wound with 1 week of a cast we are using silver collagen. Silver collagen on the right side. The right side has been the better wound surface. We will reapply the total contact cast on the left 2/16; not much improvement on either side I been using silver collagen with a total contact cast on the left. I'm changing the Hydrofera Blue still with a total contact cast on the left 2/23; some improvement on both sides. Disappointing that he has thick callus around the area that we are putting in a total contact cast on the left. We've been using Hydrofera Blue on both wound areas. This is a man who at essentially pressure ulcers in addition to ischemia caused by medications to support his blood pressure (pressors) in the ICU. He was being ventilated in the standing position for severe Covid. A Shiley the wounds extended across his entire foot but are now localized to his plantar heels bilaterally. We have made progress however neither areas healed. I continue to think the total  contact cast is helped albeit painstakingly slowly. He has never wanted a plastic surgery consult although I don't know that they would be interested in grafting in area in this location. 10/07/2021: Continued improvement bilaterally. There is still some callus around the left wound, despite the total contact cast. He has some increased pain in his right midfoot around 1 particular area. This  has been painful in the past but seems to be a little bit worse. When his cast was removed today, there was an area on the heel of the left foot that looks a bit macerated. He is also complaining of pain in his left thigh and hip which he thinks is secondary to the limb length discrepancy caused by the cast. 10/14/2021: He continues to improve. A little bit less callus around the left wound. He continues to endorse pain in his right midfoot, but this is not as significant as it was last week. The maceration on his left heel is improved. 10/21/2021: Continued improvement to both wounds. The maceration on his left heel is no longer evident. Less callus bilaterally. Epithelialization progressing. 10/28/2021: Significant improvement this week. The right sided wound is nearly closed with just a small open area at the middle. No maceration seen on the left heel. Continued epithelialization on both sides. No concern for infection. 11/04/2021: T oday, the wounds were measured a little bit differently and come out as larger, but I actually think they are about the same to potentially even smaller, particularly on the left. He continues to accumulate some callus on the right. 11/11/2021: T oday, the patient is expressing some concern that the left wound, despite being in the total contact cast, is not progressing at the same rate as the 1 on the right. He is interested in trying a week without the cast to see how the wound does. The wounds are roughly the same size as last week, with the right perhaps being a little bit smaller. He  continues to build up callus on both sites. JARIEL, SNITZER ([id number]) [id number] Page 5 of 17 11/18/2021: Last week, I permitted the patient to go without his total contact cast, just to see if the cast was really making any difference. Today, both wounds have deteriorated to some extent, suggesting that the cast is providing benefit, at least on the left. Both are larger and have accumulated callus, slough, and other debris. 11/26/2021: I debrided both wounds quite aggressively last week in an effort to stimulate the healing cascade. This appears to have been effective as the left sided wound is a full centimeter shorter in length. Although the right was measured slightly larger, on inspection, it looks as though an area of epithelialized tissue was included in the measurements. We have been using PolyMem Ag on the wound surfaces with a total contact cast to the left. 12/02/2021: It appears that the intake personnel are including epithelialized tissue in his wound measurements; the right wound is almost completely epithelialized; there is just a crater at the proximal midfoot with some open areas. On the left, he has built up some callus, but the overall wound surface looks good. There is some senescent skin around the wound margin. He has been in PolyMem Ag bilaterally with a total contact cast on the left. 12/09/2021: The right wound is nearly closed; there is just a small open area at the mid calcaneus. On the left, the wound is smaller with minimal callus buildup. No significant drainage. 12/16/2021: The right calcaneal wound remains minimally open at the mid calcaneus; the rest has epithelialized. On the left, the wound is also a little bit smaller. There is some senescent tissue on the periphery. He is getting his first application of a trial skin substitute called Vendaje today. 12/23/2021: The wound on his right calcaneus is nearly closed; there is just a small area at  the most distal  aspect of the calcaneus that is open. On the left, the area where we applied to the skin substitute has a healthier-looking bed of granulation tissue. The wound dimensions are not significantly different on this side but the wound surface is improved. 12/30/2021: The wound on the right calcaneus has not changed significantly aside from some accumulation of callus. On the left, the open area is smaller and continues to have an improved surface. He continues to accumulate callus around the wound. He is here for his third application of Vendaje. 01/06/2022: The right calcaneal wound is down to just a couple of millimeters. He continues to accumulate periwound callus. He unfortunately got his cast wet earlier in the week and his left foot is macerated, resulting in some superficial skin loss just distal to the open wound. The open wound itself, however, is much smaller and has a healthier appearing surface. He is here for his fourth application of Vendaje. 01/13/2022: The right calcaneal wound is about the same. Unfortunately, once again, his cast got wet and his foot is again macerated. This is caused the left calcaneal wound to enlarge. He is here for his fifth application of Vendaje. 01/20/2022: The right calcaneal wound is very small. There is some periwound callus accumulation. He purchased a new cast protector last week and this has been effective in avoiding the maceration that has been occurring on the left. The left calcaneal wound is narrower and has a healthy and viable-appearing surface. He is here for his 6 application of Vendaje. 01/27/2022: The right calcaneal wound is down to just a pinhole. There is some periwound slough and callus. On the left, the wound is narrower and shorter by about a centimeter. The surface is robust and viable-appearing. Unfortunately, the rep for the trial skin substitute product did not provide any for [place] to use today. 02/04/2022: The right calcaneal  wound remains unchanged. There is more accumulated callus. On the left, although the intake nurse measured it a little bit longer, it looks about the same to me. The surface has a layer of slough, but underneath this, there is good granulation tissue. 02/10/2022: The right calcaneus wound is nearly closed. There is still some callus that builds up around the site. The left side looks about the same in terms of dimensions, but the surface is more robust and vital-appearing. 02/16/2022: The area of the right calcaneus that was nearly closed last week has closed, but there is a small opening at the mid foot where it looks like some moisture got retained and caused some reopening. The left foot wound is narrower and shallower. Both sites have a fair amount of periwound callus and eschar. 02/24/2022: The small midfoot opening on the right calcaneus is a little bit smaller today. The left foot wound is narrower and shallower. He continues to accumulate periwound callus. No concern for infection. 03/01/2022: The patient came to clinic early because he showered and got his cast wet. Fortunately, there is no significant maceration to his foot but the callus softened and it looks like the wound on his left calcaneus may be a little bit wider. The wound on his right calcaneus is just a narrow slit. Continued accumulation of periwound callus bilaterally. 03/08/2022: The wound on his right calcaneus is very nearly closed, just a small pinpoint opening under a bit of eschar; the left wound has come in quite a bit, as well. It is narrower and shorter than at our last visit. Still with accumulated callus and eschar  bilaterally. 03/17/2022: The right calcaneal wound is healed. The left wound is smaller and the surface itself is very clean, but there is some blue-green staining on the periwound callus, concerning for Pseudomonas aeruginosa. 03/23/2022: The right calcaneal wound remains closed. The left wound continues to  contract. No further blue-green staining. Small amount of callus and slough accumulation. 03/28/2022: He came in early today because he had gotten his cast wet. On inspection, the wound itself did not get wet or macerated, just a little bit of the forefoot. The wound itself is basically unchanged. 04/07/2022: The right foot wound remains closed. The left wound is the smallest that I have seen it to date. It is narrower and shorter. It still continues to accumulate slough on the surface. 04/15/2022: There is a band of epithelium now dividing the small left plantar foot wound in 2. There is still some slough on the surface. 04/21/2022: The wound continues to narrow. Just a little bit of slough on the surface. He seems to be responding well to endoform. 04/28/2022: Continued slow contraction of the wound. There is a little slough on the surface and some periwound callus. We have been using endoform and total contact cast. 05/05/2022: The wound appears to have stalled. There is slough and some periwound eschar/callus. No concern for infection, however. 05/12/2022: Unfortunately, his right foot has reopened. It is located at the most posterior aspect of his surgical incision. The area was noted to have drainage coming from it when his padding was removed today. Underneath some callus and senescent skin, there is an opening. No purulent drainage or malodor. On the left foot, the wound is again unchanged. There is some light blue staining on the callus, but no malodor or purulent drainage. Electronic Signature(s) Signed: 05/12/2022 10:30:31 AM By: Fredirick Maudlin MD FACS Entered By: Fredirick Maudlin on 05/12/2022 10:30:30 [name] ([id number]) [id number] Page 6 of 17 -------------------------------------------------------------------------------- Physical Exam Details Patient Name: Date of Service: [name] [place] E. 05/12/2022 9:30 A M Medical Record Number:  [id number] Patient Account Number: [id number] Date of Birth/Sex: Treating RN: September 27, 1975 (46 y.o. M) Primary Care Provider: [name] Other Clinician: Referring Provider: Treating Provider/Extender: [name] in Treatment: 95 Constitutional Slightly hypertensive. . . . No acute distress.Marland Kitchen Respiratory Normal work of breathing on room air.. Notes 05/12/2022: Unfortunately, his right foot has reopened. It is located at the most posterior aspect of his surgical incision. The area was noted to have drainage coming from it when his padding was removed today. Underneath some callus and senescent skin, there is an opening. No purulent drainage or malodor. On the left foot, the wound is again unchanged. There is some light blue staining on the callus, but no malodor or purulent drainage. Electronic Signature(s) Signed: 05/12/2022 10:32:37 AM By: Fredirick Maudlin MD FACS Entered By: Fredirick Maudlin on 05/12/2022 10:32:37 -------------------------------------------------------------------------------- Physician Orders Details Patient Name: Date of Service: [name] [name], [place] 05/12/2022 9:30 A M Medical Record Number: [id number] Patient Account Number: [id number] Date of Birth/Sex: Treating RN: 1976/04/03 (46 y.o. [name] Primary Care Provider: [name] Other Clinician: Referring Provider: Treating Provider/Extender: [name] in Treatment: [id number] Verbal / Phone Orders: No Diagnosis Coding ICD-10 Coding Code Description [phone] Non-pressure chronic ulcer of other part of right foot with fat layer exposed L97.522 Non-pressure chronic ulcer of other part of left foot with fat layer exposed Follow-up Appointments ppointment in 1 week. - Dr [name] Room 3 10/12 at 0930;10/19  at 0930;10/26 at 0930 Return A Anesthetic Wound #2 Left Calcaneus (In clinic) Topical Lidocaine 4% applied to wound bed - In clinic Bathing/ Shower/ Hygiene May  shower with protection but do not get wound dressing(s) wet. - Cover with cast protector (can purchase cast protector at [facility] or [facility] ) Edema Control - Lymphedema / SCD / Other Bilateral Lower Extremities Elevate legs to the level of the heart or above for 30 minutes daily and/or when sitting, a frequency of: - throughout the day Avoid standing for long periods of time. Moisturize legs daily. - right leg and foot every night. Off-Loading Total Contact Cast to Left Lower Extremity - TCC to Left LL HULEY, JUPIN ([id number]) [id number] Page 7 of 17 Other: - keep pressure off of the bottom of your feet Additional Orders / Instructions Follow Nutritious Diet Non Wound Condition Right Lower Extremity Other Non Wound Condition Orders/Instructions: - Right Heel-Use Opifoam and Border Foam and Kerlix/Medipore soft cloth surgical tape (4x10 in/yd) for Non wound on Right Heel. Wound Treatment Wound #1 - Calcaneus Wound Laterality: Right Cleanser: Normal Saline (Generic) Every Other Day/15 Days Discharge Instructions: Cleanse the wound with Normal Saline prior to applying a clean dressing using gauze sponges, not tissue or cotton balls. Cleanser: Wound Cleanser Every Other Day/15 Days Discharge Instructions: Cleanse the wound with wound cleanser prior to applying a clean dressing using gauze sponges, not tissue or cotton balls. Prim Dressing: Endoform 2x2 in (Generic) Every Other Day/15 Days ary Discharge Instructions: Moisten with saline Secondary Dressing: Optifoam Non-Adhesive Dressing, 4x4 in (Generic) Every Other Day/15 Days Discharge Instructions: Apply over primary dressing as directed. Secondary Dressing: Woven Gauze Sponge, Non-Sterile 4x4 in Every Other Day/15 Days Discharge Instructions: Apply over primary dressing as directed. Secured With: 39M Medipore H Soft Cloth Surgical T ape, 4 x 10 (in/yd) (Generic) Every Other Day/15 Days Discharge  Instructions: Secure with tape as directed. Wound #2 - Calcaneus Wound Laterality: Left Cleanser: Normal Saline (Generic) 1 x Per Week/30 Days Discharge Instructions: Cleanse the wound with Normal Saline prior to applying a clean dressing using gauze sponges, not tissue or cotton balls. Cleanser: Wound Cleanser 1 x Per Week/30 Days Discharge Instructions: Cleanse the wound with wound cleanser prior to applying a clean dressing using gauze sponges, not tissue or cotton balls. Prim Dressing: Endoform 2x2 in 1 x Per Week/30 Days ary Discharge Instructions: Moisten with saline Secondary Dressing: Optifoam Non-Adhesive Dressing, 4x4 in (Generic) 1 x Per Week/30 Days Discharge Instructions: Apply over primary dressing as directed. Secondary Dressing: Woven Gauze Sponge, Non-Sterile 4x4 in 1 x Per Week/30 Days Discharge Instructions: Apply over primary dressing as directed. Secured With: 39M Medipore H Soft Cloth Surgical T ape, 4 x 10 (in/yd) (Generic) 1 x Per Week/30 Days Discharge Instructions: Secure with tape as directed. Laboratory naerobe culture (MICRO) - (ICD10 [phone] - Non-pressure chronic ulcer of other part of right Bacteria identified in Unspecified specimen by A foot with fat layer exposed) LOINC Code: [id number] Convenience Name: Anaerobic culture naerobe culture (MICRO) - (ICD10 L97.522 - Non-pressure chronic ulcer of other part of left foot Bacteria identified in Unspecified specimen by A with fat layer exposed) LOINC Code: [id number] Convenience Name: Anaerobic culture Patient Medications llergies: No Known Drug Allergies A Notifications Medication Indication Start End prior to debridement 05/12/2022 lidocaine DOSE topical 5 % ointment - ointment topical Electronic Signature(s) Signed: 05/12/2022 12:42:20 PM By: Fredirick Maudlin MD FACS Signed: 05/17/2022 3:12:14 PM By: Sharyn Creamer RN, BSN [place], TONY10/05/2022 3:12:14 PM By:  Redmond Pulling RN, BSN SignedBea Laura ([id number])  [id number] Page 8 of 17 Previous Signature: 05/12/2022 12:28:30 PM Version By: [name] MD FACS Entered By: Redmond Pulling on 05/12/2022 12:40:16 Prescription 05/12/2022 -------------------------------------------------------------------------------- Kathryne Gin MD Patient Name: Provider: [birth year] [id number] Date of Birth: NPI#[name] [name] [id number] Sex: DEA #: [phone] Phone #: License #: Eligha Bridegroom [facility] Patient Address: [phone] [facility] [address] [place], [place] [address] [address] [place], [place] [address] [phone] Allergies No Known Drug Allergies Provider's Orders naerobe culture - ICD10: L97.512 Bacteria identified in Unspecified specimen by A LOINC Code: 635-3 Convenience Name: Anaerobic culture Hand Signature: Date(s): Prescription 05/12/2022 Kathryne Gin MD Patient Name: Provider: 1976-02-10 [id number] Date of Birth: NPI#: [name] [name] [id number] Sex: DEA #: [phone] Phone #: License #: Eligha Bridegroom [facility] Patient Address: [phone] [facility] [address] [place], [place] [address] [address] [place], [place] [address] [phone] Allergies No Known Drug Allergies Provider's Orders naerobe culture - ICD10: L97.522 Bacteria identified in Unspecified specimen by A LOINC Code: 635-3 Convenience Name: Anaerobic culture Hand Signature: Date(s): Electronic Signature(s) Signed: 05/12/2022 12:42:20 PM By: [name] MD FACS Signed: 05/17/2022 3:12:14 PM By: Redmond Pulling RN, BSN Previous Signature: 05/12/2022 12:28:30 PM Version By: [name] MD FACS Entered By: Redmond Pulling on 05/12/2022 12:40:18 [name] ([id number]) [id number] Page 9 of 17 -------------------------------------------------------------------------------- Problem List Details Patient  Name: Date of Service: [name] [name], TO [name] 05/12/2022 9:30 A M Medical Record Number: [id number] Patient Account Number: [id number] Date of Birth/Sex: Treating RN: Feb 24, 1976 (46 y.o. M) Primary Care Provider: Dorinda Hill Other Clinician: Referring Provider: Treating Provider/Extender: Jana Hakim in Treatment: 95 Active Problems ICD-10 Encounter Code Description Active Date MDM Diagnosis L97.522 Non-pressure chronic ulcer of other part of left foot with fat layer exposed 09/03/2020 No Yes L97.512 Non-pressure chronic ulcer of other part of right foot with fat layer exposed 09/03/2020 No Yes Inactive Problems ICD-10 Code Description Active Date Inactive Date L89.893 Pressure ulcer of other site, stage 3 07/15/2020 07/15/2020 M62.81 Muscle weakness (generalized) 07/15/2020 07/15/2020 I10 Essential (primary) hypertension 07/15/2020 07/15/2020 M86.171 Other acute osteomyelitis, right ankle and foot 09/03/2020 09/03/2020 Resolved Problems Electronic Signature(s) Signed: 05/12/2022 10:28:55 AM By: [name] MD FACS Entered By: [name] on 05/12/2022 10:28:55 -------------------------------------------------------------------------------- Progress Note Details Patient Name: Date of Service: [name] [name], [name] 05/12/2022 9:30 A M Medical Record Number: [id number] Patient Account Number: [id number] Date of Birth/Sex: Treating RN: [birth year] (46 y.o. M) Primary Care Provider: Dorinda Hill Other Clinician: Referring Provider: Treating Provider/Extender: Jana Hakim in Treatment: 95 Subjective Chief Complaint Information obtained from Patient JABE, WARF ([id number]) [id number] Page 10 of 17 Bilateral Plantar Foot Ulcers History of Present Illness (HPI) Wounds are12/03/2020 upon evaluation today patient presents for initial inspection here in our clinic concerning issues he has been having with the bottoms  of his feet bilaterally. He states these actually occurred as wounds when he was hospitalized for 5 months secondary to Covid. He was apparently with tilting bed where he was in an upright position quite frequently and apparently this occurred in some way shape or form during that time. Fortunately there is no sign of active infection at this time. No fevers, chills, nausea, vomiting, or diarrhea. With that being said he still has substantial wounds on the plantar aspects of his feet Theragen require quite a bit  of work to get these to heal. He has been using Santyl currently though that is been problematic both in receiving the medication as well as actually paid for it as it is become quite expensive. Prior to the experience with Covid the patient really did not have any major medical problems other than hypertension he does have some mild generalized weakness following the Covid experience. 07/22/2020 on evaluation today patient appears to be doing okay in regard to his foot ulcers I feel like the wound beds are showing signs of better improvement that I do believe the Iodoflex is helping in this regard. With that being said he does have a lot of drainage currently and this is somewhat blue/green in nature which is consistent with Pseudomonas. I do think a culture today would be appropriate for [place] to evaluate and see if that is indeed the case I would likely start him on antibiotic orally as well he is not allergic to Cipro knows of no issues he has had in the past 12/21; patient was admitted to the clinic earlier this month with bilateral presumed pressure ulcers on the bottom of his feet apparently related to excessive pressure from a tilt table arrangement in the intensive care unit. Patient relates this to being on ECMO but I am not really sure that is exactly related to that. I must say I have never seen anything like this. He has fairly extensive full-thickness wounds extending from his heel  towards his midfoot mostly centered laterally. There is already been some healing distally. He does not appear to have an arterial issue. He has been using gentamicin to the wound surfaces with Iodoflex to help with ongoing debridement 1/6; this is a patient with pressure ulcers on the bottom of his feet related to excessive pressure from a standing position in the intensive care unit. He is complaining of a lot of pain in the right heel. He is not a diabetic. 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 We have been using Iodoflex to help prepare the surfaces of both wounds for an advanced treatment product. He is nonambulatory spending most of his time in a wheelchair I have asked him not to propel the wheelchair with his heels 1/13; in general his wounds look better not much surface area change we have been using Iodoflex as of last week. I did an x-ray of the right heel as the patient was complaining of pain. I had some thoughts about a stress fracture perhaps Achilles tendon problems however what it showed was erosive changes along the inferior aspect of the calcaneus he now has a MRI booked for 1/20. 1/20; in general his wounds continue to be better. Some improvement in the large narrow areas proximally in his foot. He is still complaining of pain in the right heel and tenderness in certain areas of this wound. His MRI is tonight. I am not just looking for osteomyelitis that was brought up on the x-ray I am wondering about stress fractures, tendon ruptures etc. He has no such findings on the left. Also noteworthy is that the patient had critical illness neuropathy and some of the discomfort may be actual improvement in nerve function I am just not sure. These wounds were initially in the setting of severe critical illness related to COVID-19. He was put in a standing position. He may have also been on pressors at the point contributing to tissue ischemia. By his description  at some point these wounds were  grossly necrotic extending proximally up into the Achilles part of his heel. I do not know that I have ever really seen pictures of them like this although they may exist in epic We have ordered Tri layer Oasis. I am trying to stimulate some granulation in these areas. This is of course assuming the MRI is negative for infection 1/27; since the patient was last here he saw Dr. Juleen China of infectious disease. He is planned for vancomycin and ceftriaxone. Prior operative culture grew MSSA. Also ordered baseline lab work. He also ordered arterial studies although the ABIs in our clinic were normal as well as his clinical exam these were normal I do not think he needs to see vascular surgery. His ABIs at the PTA were 1.22 in the right triphasic waveforms with a normal TBI of 1.15 on the left ABI of 1.22 with triphasic waveforms and a normal TBI of 1.08. Finally he saw Dr. Amalia Hailey who will follow him in for 2 months. At this point I do not think he felt that he needed a procedure on the right calcaneal bone. Dr. Juleen China is elected for broad-spectrum antibiotic The patient is still having pain in the right heel. He walks with a walker 2/3; wounds are generally smaller. He is tolerating his IV antibiotics. I believe this is vancomycin and ceftriaxone. We are still waiting for Oasis burn in terms of his out-of-pocket max which he should be meeting soon given the IV antibiotics, MRIs etc. I have asked him to check in on this. We are using silver collagen in the meantime the wounds look better 2/10; tolerating IV vancomycin and Rocephin. We are waiting to apply for Oasis. Although I am not really sure where he is in his out-of-pocket max. 2/17 started the first application of Oasis trilayer. Still on antibiotics. The wounds have generally look better. The area on the left has a little more surface slough requiring debridement [id number]; second application of Oasis trilayer. The wound  surface granulation is generally look better. The area on the left with undermining laterally I think is come in a bit. 10/08/2020 upon evaluation today patient is here today for [employer] application #3. Fortunately he seems to be doing extremely well with regard to this and we are seeing a lot of new epithelial growth which is great news. Fortunately there is no signs of active infection at this time. 10/16/2020 upon evaluation today patient appears to be doing well with regard to his foot ulcers. Do believe the Oasis has been of benefit for him. I do not see any signs of infection right now which is great news and I think that he has a lot of new epithelial growth which is great to see as well. The patient is very pleased to hear all of this. I do think we can proceed with the Oasis trilayer #4 today. 3/18; not as much improvement in these areas on his heels that I was hoping. I did reapply trilateral Oasis today the tissue looks healthier but not as much fill in as I was hoping. 3/25; better looking today I think this is come in a bit the tissue looks healthier. Triple layer Oasis reapplied #6 4/1; somewhat better looking definitely better looking surface not as much change in surface area as I was hoping. He may be spending more time [place] on days then he needs to although he does have heel offloading boots. Triple layer Oasis reapplied #7 4/7; unfortunately apparently [facility] will not  approve any further Oasis which is unfortunate since the patient did respond nicely both in terms of the condition of the wound bed as well as surface area. There is still some drainage coming from the wound but not a lot there does not appear to be any infection 4/15; we have been using Hydrofera Blue. He continues to have nice rims of epithelialization on the right greater than the left. The left the epithelialization is coming from the tip of his heel. There is moderate drainage. In this that  concerns me about a total contact cast. There is no evidence of infection 4/29; patient has been using Hydrofera Blue with dressing changes. He has no complaints or issues today. 5/5; using Hydrofera Blue. I actually think that he looks marginally better than the last time I saw this 3 weeks ago. There are rims of epithelialization on the left thumb coming from the medial side on the right. Using Hydrofera Blue 5/12; using Hydrofera Blue. These continue to make improvements in surface area. His drainage was not listed as severe I therefore went ahead and put a cast on the left foot. Right foot we will continue to dress his previous 5/16; back for first total contact cast change. He did not tolerate this particularly well cast injury on the anterior tibia among other issues. Difficulty sleeping. I talked him about this in some detail and afterwards is elected to continue. I told him I would like to have a cast on for 3 weeks to see if this is going to help at all. I think he agreed 5/19; I think the wound is better. There is no tunneling towards his midfoot. The undermining medially also looks better. He has a rim of new skin distally. I think we are making progress here. The area on the left also continues to look somewhat better to me using Hydrofera Blue. He has a list of complaints about the cast but none of them seem serious 5/26; patient presents for 1 week follow-up. He has been using a total contact cast and tolerating this well. Hydrofera Blue is the main dressing used. He denies signs of infection. 6/2 Hydrofera Blue total contact cast on the left. These were large ulcers that formed in intensive care unit where the patient was recovering from [place]. [address] ([id number]) [id number] Page 11 of 17 have had something to do with being ventilated in an upright positiono Pressors etc. We have been able to get the areas down considerably and a  viable surface. There is some epithelialization in both sides. Note made of drainage 6/9; changed to [facility] last time because of drainage. He arrives with better looking surfaces and dimensions on the left than the right. Paradoxically the right actually probes more towards his midfoot the left is largely close down but both of these look improved. Using a total contact cast on the left 6/16; complex wounds on his bilateral plantar heels which were initially pressure injury from a stay in the ICU with COVID. We have been using silver alginate most recently. His dimensions of come in quite dramatically however not recently. We have been putting the left foot in a total contact cast 6/23; complex wounds on the bilateral plantar heels. I been putting the left in the cast paradoxically the area on the right is the one that is going towards closure at a faster rate. Quite a bit of drainage on the left. The patient went to see Dr. Amalia Hailey who said he  was going to [facility] for skin grafts. I had actually considered sending him for skin grafts however he would be mandatorily off his feet for a period of weeks to months. I am thinking that the area on the right is going to close on its own the area on the left has been more stubborn even though we have him in a total contact cast 6/30; took him out of a total contact cast last week is the right heel seem to be making better progress than the left where I was placing the cast. We are using silver alginate. Both wounds are smaller right greater than left 7/12; both wounds look as though they are making some progress. We are using silver alginate. Heel offloading boots 7/26; very gradual progress especially on the right. Using silver alginate. He is wearing heel offloading boots 8/18; he continues to close these wounds down very gradually. Using silver alginate. The problem polymen being definitive about this is areas of what appears to be callus around the  margins. This is not a 100% of the area but certainly sizable especially on the right 9/1; bilateral plantar feet wounds secondary to prolonged pressure while being ventilated for COVID-19 in an upright position. Essentially pressure ulcers on the bottom of his feet. He is made substantial progress using silver alginate. 9/14; bilateral plantar feet wounds secondary to prolonged pressure. Making progress using silver alginate. 9/29 bilateral plantar feet wounds secondary to prolonged pressure. I changed him to Iodoflex last week. MolecuLight showing reddened blush fluorescence 10/11; patient presents for follow-up. He has no issues or complaints today. He denies signs of infection. He continues to use Iodoflex and antibiotic ointment to the wound beds. 10/27; 2-week follow-up. No evidence of infection. He has callus and thick dry skin around the wound margins we have been using Iodoflex and Bactroban which was in response to a moderate left MolecuLight reddish blush fluorescence. 11/10; 2-week follow-up. Wound margins again have thick callus however the measurements of the actual wound sites are a lot smaller. Everything looks reasonably healthy here. We have been using Iodoflex He was approved for prime matrix but I have elected to delay this given the improvement in the surface area. Hopefully I will not regret that decision as were getting close to the end of the year in terms of insurance payment 12/8; 2-week follow-up. Wounds are generally smaller in size. These were initially substantial wounds extending into the forefoot all the way into the heel on the bilateral plantar feet. They are now both located on the plantar heel distal aspect both of these have a lot of callus around the wounds I used a #5 curette to remove this on the right and the left also some subcutaneous debris to try and get the wound edges were using Iodoflex. He has heel offloading shoe 12/22; 2-week follow-up. Not really  much improvement. He has thick callus around the outer edges of both wounds. I remove this there is some nonviable subcutaneous tissue as well. We have been using Iodoflex. Her intake nurse and myself spontaneously thought of a total contact cast I went back in May. At that time we really were not seeing much of an improvement with a cast although the wound was in a much different situation I would like to retry this in 2 weeks and I discussed this with the patient 08/12/2021; the patient has had some improvement with the Iodoflex. The the area on the left heel plantar more improved than the right.  I had to put him in a total contact cast on the left although I decided to put that off for 2 weeks. I am going to change his primary dressing to silver collagen. I think in both areas he has had some improvement most of the healing seems to be more proximal in the heel. The wounds are in the mid aspect. A lot of thick callus on the right heel however. 1/19; we are using silver collagen on both plantar heel areas. He has had some improvement today. The left did not require any debridement. He still had some eschar on the right that was debrided but both seem to have contracted. I did not put it total contact cast on him today 2/2 we have been using silver collagen. The area on the right plantar heel has areas that appear to be epithelialized interspersed with dry flaking callus and dry skin. I removed this. This really looks better than on the other side. On the left still a large area with raised edges and debris on the surface. 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 He has no issues or complaints today. 2/9; not much change in the left foot wound with 1 week of a cast we are using silver collagen. Silver collagen on the right side. The right side has been the better wound  surface. We will reapply the total contact cast on the left 2/16; not much improvement on either side I been using silver collagen with a total contact cast on the left. I'm changing the Hydrofera Blue still with a total contact cast on the left 2/23; some improvement on both sides. Disappointing that he has thick callus around the area that we are putting in a total contact cast on the left. We've been using Hydrofera Blue on both wound areas. This is a man who at essentially pressure ulcers in addition to ischemia caused by medications to support his blood pressure (pressors) in the ICU. He was being ventilated in the standing position for severe Covid. A Shiley the wounds extended across his entire foot but are now localized to his plantar heels bilaterally. We have made progress however neither areas healed. I continue to think the total contact cast is helped albeit painstakingly slowly. He has never wanted a plastic surgery consult although I don't know that they would be interested in grafting in area in this location. 10/07/2021: Continued improvement bilaterally. There is still some callus around the left wound, despite the total contact cast. He has some increased pain in his right midfoot around 1 particular area. This has been painful in the past but seems to be a little bit worse. When his cast was removed today, there was an area on the heel of the left foot that looks a bit macerated. He is also complaining of pain in his left thigh and hip which he thinks is secondary to the limb length discrepancy caused by the cast. 10/14/2021: He continues to improve. A little bit less callus around the left wound. He continues to endorse pain in his right midfoot, but this is not as significant as it was last week. The maceration on his left heel is improved. 10/21/2021: Continued improvement to both wounds. The maceration on his left heel is no longer evident. Less callus bilaterally. Epithelialization  progressing. 10/28/2021: Significant improvement this week. The right sided  wound is nearly closed with just a small open area at the middle. No maceration seen on the left heel. Continued epithelialization on both sides. No concern for infection. 11/04/2021: T oday, the wounds were measured a little bit differently and come out as larger, but I actually think they are about the same to potentially even smaller, particularly on the left. He continues to accumulate some callus on the right. 11/11/2021: T oday, the patient is expressing some concern that the left wound, despite being in the total contact cast, is not progressing at the same rate as the 1 on the right. He is interested in trying a week without the cast to see how the wound does. The wounds are roughly the same size as last week, with the right perhaps being a little bit smaller. He continues to build up callus on both sites. 11/18/2021: Last week, I permitted the patient to go without his total contact cast, just to see if the cast was really making any difference. Today, both wounds have deteriorated to some extent, suggesting that the cast is providing benefit, at least on the left. Both are larger and have accumulated callus, slough, and other debris. 11/26/2021: I debrided both wounds quite aggressively last week in an effort to stimulate the healing cascade. This appears to have been effective as the left sided wound is a full centimeter shorter in length. Although the right was measured slightly larger, on inspection, it looks as though an area of epithelialized Morton StallGOURLEY, Levis E ([id number]) [id number] Page 12 of 17 tissue was included in the measurements. We have been using PolyMem Ag on the wound surfaces with a total contact cast to the left. 12/02/2021: It appears that the intake personnel are including epithelialized tissue in his wound measurements; the right wound is almost completely epithelialized;  there is just a crater at the proximal midfoot with some open areas. On the left, he has built up some callus, but the overall wound surface looks good. There is some senescent skin around the wound margin. He has been in PolyMem Ag bilaterally with a total contact cast on the left. 12/09/2021: The right wound is nearly closed; there is just a small open area at the mid calcaneus. On the left, the wound is smaller with minimal callus buildup. No significant drainage. 12/16/2021: The right calcaneal wound remains minimally open at the mid calcaneus; the rest has epithelialized. On the left, the wound is also a little bit smaller. There is some senescent tissue on the periphery. He is getting his first application of a trial skin substitute called Vendaje today. 12/23/2021: The wound on his right calcaneus is nearly closed; there is just a small area at the most distal aspect of the calcaneus that is open. On the left, the area where we applied to the skin substitute has a healthier-looking bed of granulation tissue. The wound dimensions are not significantly different on this side but the wound surface is improved. 12/30/2021: The wound on the right calcaneus has not changed significantly aside from some accumulation of callus. On the left, the open area is smaller and continues to have an improved surface. He continues to accumulate callus around the wound. He is here for his third application of Vendaje. 01/06/2022: The right calcaneal wound is down to just a couple of millimeters. He continues to accumulate periwound callus. He unfortunately got his cast wet earlier in the week and his left foot is macerated, resulting in some superficial skin loss just distal  to the open wound. The open wound itself, however, is much smaller and has a healthier appearing surface. He is here for his fourth application of Vendaje. 01/13/2022: The right calcaneal wound is about the same. Unfortunately, once again, his cast got  wet and his foot is again macerated. This is caused the left calcaneal wound to enlarge. He is here for his fifth application of Vendaje. 01/20/2022: The right calcaneal wound is very small. There is some periwound callus accumulation. He purchased a new cast protector last week and this has been effective in avoiding the maceration that has been occurring on the left. The left calcaneal wound is narrower and has a healthy and viable-appearing surface. He is here for his 6 application of Vendaje. 01/27/2022: The right calcaneal wound is down to just a pinhole. There is some periwound slough and callus. On the left, the wound is narrower and shorter by about a centimeter. The surface is robust and viable-appearing. Unfortunately, the rep for the trial skin substitute product did not provide any for [place] to use today. 02/04/2022: The right calcaneal wound remains unchanged. There is more accumulated callus. On the left, although the intake nurse measured it a little bit longer, it looks about the same to me. The surface has a layer of slough, but underneath this, there is good granulation tissue. 02/10/2022: The right calcaneus wound is nearly closed. There is still some callus that builds up around the site. The left side looks about the same in terms of dimensions, but the surface is more robust and vital-appearing. 02/16/2022: The area of the right calcaneus that was nearly closed last week has closed, but there is a small opening at the mid foot where it looks like some moisture got retained and caused some reopening. The left foot wound is narrower and shallower. Both sites have a fair amount of periwound callus and eschar. 02/24/2022: The small midfoot opening on the right calcaneus is a little bit smaller today. The left foot wound is narrower and shallower. He continues to accumulate periwound callus. No concern for infection. 03/01/2022: The patient came to clinic early because he showered and got his  cast wet. Fortunately, there is no significant maceration to his foot but the callus softened and it looks like the wound on his left calcaneus may be a little bit wider. The wound on his right calcaneus is just a narrow slit. Continued accumulation of periwound callus bilaterally. 03/08/2022: The wound on his right calcaneus is very nearly closed, just a small pinpoint opening under a bit of eschar; the left wound has come in quite a bit, as well. It is narrower and shorter than at our last visit. Still with accumulated callus and eschar bilaterally. 03/17/2022: The right calcaneal wound is healed. The left wound is smaller and the surface itself is very clean, but there is some blue-green staining on the periwound callus, concerning for Pseudomonas aeruginosa. 03/23/2022: The right calcaneal wound remains closed. The left wound continues to contract. No further blue-green staining. Small amount of callus and slough accumulation. 03/28/2022: He came in early today because he had gotten his cast wet. On inspection, the wound itself did not get wet or macerated, just a little bit of the forefoot. The wound itself is basically unchanged. 04/07/2022: The right foot wound remains closed. The left wound is the smallest that I have seen it to date. It is narrower and shorter. It still continues to accumulate slough on the surface. 04/15/2022: There is a  band of epithelium now dividing the small left plantar foot wound in 2. There is still some slough on the surface. 04/21/2022: The wound continues to narrow. Just a little bit of slough on the surface. He seems to be responding well to endoform. 04/28/2022: Continued slow contraction of the wound. There is a little slough on the surface and some periwound callus. We have been using endoform and total contact cast. 05/05/2022: The wound appears to have stalled. There is slough and some periwound eschar/callus. No concern for infection, however. 05/12/2022:  Unfortunately, his right foot has reopened. It is located at the most posterior aspect of his surgical incision. The area was noted to have drainage coming from it when his padding was removed today. Underneath some callus and senescent skin, there is an opening. No purulent drainage or malodor. On the left foot, the wound is again unchanged. There is some light blue staining on the callus, but no malodor or purulent drainage. Patient History Information obtained from Patient. Family History Cancer - Maternal Grandparents, Diabetes - Father,Paternal Grandparents, Heart Disease - Maternal Grandparents, Hypertension - Father,Paternal Grandparents, Lung Disease - Siblings, No family history of Hereditary Spherocytosis, Kidney Disease, Seizures, Stroke, Thyroid Problems, Tuberculosis. Social History Never smoker, Marital Status - Married, Alcohol Use - Never, Drug Use - No History, Caffeine Use - Daily - tea, soda. Medical History Eyes HAWKEN, BARTHOLF ([id number]) [id number] Page 13 of 17 Denies history of Cataracts, Glaucoma, Optic Neuritis Ear/Nose/Mouth/Throat Denies history of Chronic sinus problems/congestion, Middle ear problems Hematologic/Lymphatic Denies history of Anemia, Hemophilia, Human Immunodeficiency Virus, Lymphedema, Sickle Cell Disease Respiratory Patient has history of Asthma Denies history of Aspiration, Chronic Obstructive Pulmonary Disease (COPD), Pneumothorax, Sleep Apnea, Tuberculosis Cardiovascular Patient has history of Angina - with COVID, Hypertension Denies history of Arrhythmia, Congestive Heart Failure, Coronary Artery Disease, Deep Vein Thrombosis, Hypotension, Myocardial Infarction, Peripheral Arterial Disease, Peripheral Venous Disease, Phlebitis, Vasculitis Gastrointestinal Denies history of Cirrhosis , Colitis, Crohnoos, Hepatitis A, Hepatitis B, Hepatitis C Endocrine Denies history of Type I Diabetes, Type II  Diabetes Genitourinary Denies history of End Stage Renal Disease Immunological Denies history of Lupus Erythematosus, Raynaudoos, Scleroderma Integumentary (Skin) Denies history of History of Burn Musculoskeletal Denies history of Gout, Rheumatoid Arthritis, Osteoarthritis, Osteomyelitis Neurologic Denies history of Dementia, Neuropathy, Quadriplegia, Paraplegia, Seizure Disorder Oncologic Denies history of Received Chemotherapy, Received Radiation Psychiatric Denies history of Anorexia/bulimia, Confinement Anxiety Hospitalization/Surgery History - COVID PNA 07/22/2019- 11/14/2019. - 03/27/2020 wound debridement/ skin graft. Medical A Surgical History Notes nd Constitutional Symptoms (General Health) COVID PNA 07/22/2019-11/14/2019 VENT ECMO, foot drop left foot , Genitourinary kidney stone Psychiatric anxiety Objective Constitutional Slightly hypertensive. No acute distress.. Vitals Time Taken: 9:41 AM, Height: 69 in, Weight: 280 lbs, BMI: 41.3, Temperature: 97.9 F, Pulse: 69 bpm, Respiratory Rate: 18 breaths/min, Blood Pressure: 145/84 mmHg. Respiratory Normal work of breathing on room air.. General Notes: 05/12/2022: Unfortunately, his right foot has reopened. It is located at the most posterior aspect of his surgical incision. The area was noted to have drainage coming from it when his padding was removed today. Underneath some callus and senescent skin, there is an opening. No purulent drainage or malodor. On the left foot, the wound is again unchanged. There is some light blue staining on the callus, but no malodor or purulent drainage. Integumentary (Hair, Skin) Wound #1 status is Open. Original cause of wound was Pressure Injury. The date acquired was: 10/07/2019. The wound has been in treatment 95 weeks. The wound is located on the  Right Calcaneus. The wound measures 1cm length x 0.3cm width x 0.5cm depth; 0.236cm^2 area and 0.118cm^3 volume. There is Fat Layer  (Subcutaneous Tissue) exposed. There is no tunneling or undermining noted. There is a none present amount of drainage noted. The wound margin is thickened. There is large (67-100%) red, pink granulation within the wound bed. There is a small (1-33%) amount of necrotic tissue within the wound bed including Adherent Slough. The periwound skin appearance had no abnormalities noted for moisture. The periwound skin appearance had no abnormalities noted for color. The periwound skin appearance exhibited: Callus. Periwound temperature was noted as No Abnormality. Wound #2 status is Open. Original cause of wound was Pressure Injury. The date acquired was: 10/07/2019. The wound has been in treatment 95 weeks. The wound is located on the Left Calcaneus. The wound measures 3cm length x 0.5cm width x 0.2cm depth; 1.178cm^2 area and 0.236cm^3 volume. There is Fat Layer (Subcutaneous Tissue) exposed. There is no tunneling or undermining noted. There is a medium amount of serosanguineous drainage noted. The wound margin is thickened. There is small (1-33%) red granulation within the wound bed. There is a large (67-100%) amount of necrotic tissue within the wound bed including Eschar and Adherent Slough. The periwound skin appearance had no abnormalities noted for texture. The periwound skin appearance had no abnormalities noted for color. The periwound skin appearance exhibited: Maceration. Periwound temperature was noted as No Abnormality. Assessment Active Problems ARIHANT, BYRDSONG ([id number]) [id number] Page 14 of 17 ICD-10 Non-pressure chronic ulcer of other part of left foot with fat layer exposed Non-pressure chronic ulcer of other part of right foot with fat layer exposed Procedures Wound #1 Pre-procedure diagnosis of Wound #1 is a Pressure Ulcer located on the Right Calcaneus . There was a Excisional Skin/Subcutaneous Tissue Debridement with a total area of 0.3 sq cm performed  by Fredirick Maudlin, MD. With the following instrument(s): Curette to remove Non-Viable tissue/material. Material removed includes Eschar, Subcutaneous Tissue, and Slough after achieving pain control using Lidocaine 5% topical ointment. 1 specimen was taken by a Tissue Culture and sent to the lab per facility protocol. A time out was conducted at 10:13, prior to the start of the procedure. A Minimum amount of bleeding was controlled with Pressure. The procedure was tolerated well with a pain level of 0 throughout and a pain level of 0 following the procedure. Post Debridement Measurements: 1cm length x 0.3cm width x 0.5cm depth; 0.118cm^3 volume. Post debridement Stage noted as Category/Stage III. Character of Wound/Ulcer Post Debridement is improved. Post procedure Diagnosis Wound #1: Same as Pre-Procedure Wound #2 Pre-procedure diagnosis of Wound #2 is a Pressure Ulcer located on the Left Calcaneus . There was a Excisional Skin/Subcutaneous Tissue Debridement with a total area of 1.5 sq cm performed by Fredirick Maudlin, MD. With the following instrument(s): Curette to remove Non-Viable tissue/material. Material removed includes Eschar, Subcutaneous Tissue, and Slough after achieving pain control using Lidocaine 5% topical ointment. 1 specimen was taken by a Tissue Culture and sent to the lab per facility protocol. A time out was conducted at 10:13, prior to the start of the procedure. A Minimum amount of bleeding was controlled with Pressure. The procedure was tolerated well with a pain level of 0 throughout and a pain level of 0 following the procedure. Post Debridement Measurements: 3cm length x 0.5cm width x 0.2cm depth; 0.236cm^3 volume. Post debridement Stage noted as Category/Stage III. Character of Wound/Ulcer Post Debridement is improved. Post procedure Diagnosis Wound #2: Same  as Pre-Procedure Plan 05/12/2022: Unfortunately, his right foot has reopened. It is located at the most posterior  aspect of his surgical incision. The area was noted to have drainage coming from it when his padding was removed today. Underneath some callus and senescent skin, there is an opening. No purulent drainage or malodor. On the left foot, the wound is again unchanged. There is some light blue staining on the callus, but no malodor or purulent drainage. I used a curette to debride the callus and slough from around the right sided wound. I also cleaned up the left sided wound in preparation for total contact cast application. I did take cultures from each foot to see if there might be a low-grade infection that caused his right foot to reopen, as well as to try and account for the blue staining seen on the left. We will apply endoform to both wounds. T contact cast on the left. He did ask if another operation could potentially otal be helpful. There is a new podiatrist with [facility] who seems to have an interest in more reconstructive types of surgery and I suggested that they at least have a consultation with him. He is already a patient at [facility] and so they do not require referral. Follow-up in 1 week. Electronic Signature(s) Signed: 05/12/2022 10:34:51 AM By: Fredirick Maudlin MD FACS Entered By: Fredirick Maudlin on 05/12/2022 10:34:50 -------------------------------------------------------------------------------- HxROS Details Patient Name: Date of Service: [name] [name], [place] 05/12/2022 9:30 A M Medical Record Number: [id number] Patient Account Number: [id number] Date of Birth/Sex: Treating RN: 1975/10/27 (46 y.o. M) Primary Care Provider: [name] Other Clinician: Referring Provider: Treating Provider/Extender: [name] in Treatment: 95 Information Obtained From Patient Constitutional Symptoms (General Health) Medical History: Past Medical History Notes: COVID PNA 07/22/2019-11/14/2019 VENT ECMO, foot drop left foot , SISTO, INGRUM  ([id number]) [id number] Page 15 of 17 Eyes Medical History: Negative for: Cataracts; Glaucoma; Optic Neuritis Ear/Nose/Mouth/Throat Medical History: Negative for: Chronic sinus problems/congestion; Middle ear problems Hematologic/Lymphatic Medical History: Negative for: Anemia; Hemophilia; Human Immunodeficiency Virus; Lymphedema; Sickle Cell Disease Respiratory Medical History: Positive for: Asthma Negative for: Aspiration; Chronic Obstructive Pulmonary Disease (COPD); Pneumothorax; Sleep Apnea; Tuberculosis Cardiovascular Medical History: Positive for: Angina - with COVID; Hypertension Negative for: Arrhythmia; Congestive Heart Failure; Coronary Artery Disease; Deep Vein Thrombosis; Hypotension; Myocardial Infarction; Peripheral Arterial Disease; Peripheral Venous Disease; Phlebitis; Vasculitis Gastrointestinal Medical History: Negative for: Cirrhosis ; Colitis; Crohns; Hepatitis A; Hepatitis B; Hepatitis C Endocrine Medical History: Negative for: Type I Diabetes; Type II Diabetes Genitourinary Medical History: Negative for: End Stage Renal Disease Past Medical History Notes: kidney stone Immunological Medical History: Negative for: Lupus Erythematosus; Raynauds; Scleroderma Integumentary (Skin) Medical History: Negative for: History of Burn Musculoskeletal Medical History: Negative for: Gout; Rheumatoid Arthritis; Osteoarthritis; Osteomyelitis Neurologic Medical History: Negative for: Dementia; Neuropathy; Quadriplegia; Paraplegia; Seizure Disorder Oncologic Medical History: Negative for: Received Chemotherapy; Received Radiation Psychiatric Medical History: Negative for: Anorexia/bulimia; Confinement Anxiety Past Medical History Notes: anxiety Immunizations TIODORO, GULBRANSEN ([id number]) [id number] Page 16 of 17 Pneumococcal Vaccine: Received Pneumococcal Vaccination: No Implantable  Devices None Hospitalization / Surgery History Type of Hospitalization/Surgery COVID PNA 07/22/2019- 11/14/2019 03/27/2020 wound debridement/ skin graft Family and Social History Cancer: Yes - Maternal Grandparents; Diabetes: Yes - Father,Paternal Grandparents; Heart Disease: Yes - Maternal Grandparents; Hereditary Spherocytosis: No; Hypertension: Yes - Father,Paternal Grandparents; Kidney Disease: No; Lung Disease: Yes - Siblings; Seizures: No; Stroke: No; Thyroid Problems: No; Tuberculosis: No; Never  smoker; Marital Status - Married; Alcohol Use: Never; Drug Use: No History; Caffeine Use: Daily - tea, soda; Financial Concerns: No; Food, Clothing or Shelter Needs: No; Support System Lacking: No; Transportation Concerns: No Electronic Signature(s) Signed: 05/12/2022 10:51:15 AM By: Fredirick Maudlin MD FACS Entered By: Fredirick Maudlin on 05/12/2022 10:32:06 -------------------------------------------------------------------------------- SuperBill Details Patient Name: Date of Service: [name] [name], [place] 05/12/2022 Medical Record Number: [id number] Patient Account Number: [id number] Date of Birth/Sex: Treating RN: Feb 25, 1976 (46 y.o. M) Primary Care Provider: [name] Other Clinician: Referring Provider: Treating Provider/Extender: [name] in Treatment: 95 Diagnosis Coding ICD-10 Codes Code Description [phone] Non-pressure chronic ulcer of other part of right foot with fat layer exposed L97.522 Non-pressure chronic ulcer of other part of left foot with fat layer exposed Facility Procedures : CPT4 Code: [id number] Description: [place] TISSUE 20 SQ CM/< ICD-10 Diagnosis Description L97.512 Non-pressure chronic ulcer of other part of right foot with fat layer exposed Modifier: Quantity: 1 : CPT4 Code: [id number] Description: [id number] - APPLY TOTAL CONTACT LEG CAST ICD-10 Diagnosis Description L97.522 Non-pressure chronic ulcer of other part of left foot  with fat layer exposed Modifier: Quantity: 1 Physician Procedures : CPT4 Code Description Modifier [id number] - WC PHYS LEVEL 3 - EST PT 25 ICD-10 Diagnosis Description L97.512 Non-pressure chronic ulcer of other part of right foot with fat layer exposed L97.522 Non-pressure chronic ulcer of other part of left foot  with fat layer exposed Quantity: 1 : [id number] - WC PHYS SUBQ TISS 20 SQ CM ICD-10 Diagnosis Description L97.512 Non-pressure chronic ulcer of other part of right foot with fat layer exposed Quantity: 1 : [id number] - WC PHYS APPLY TOTAL CONTACT CAST ICD-10 Diagnosis Description L97.522 Non-pressure chronic ulcer of other part of left foot with fat layer exposed DETAVIOUS, [place]BR[place] E ([id number]) [id number] Quantity: 1 Page 17 of 17 Electronic Signature(s) Signed: 05/12/2022 10:35:16 AM By: Fredirick Maudlin MD FACS Entered By: Fredirick Maudlin on 05/12/2022 10:35:15

## 2022-05-19 ENCOUNTER — Ambulatory Visit (HOSPITAL_BASED_OUTPATIENT_CLINIC_OR_DEPARTMENT_OTHER): Payer: PPO | Admitting: General Surgery

## 2022-05-20 ENCOUNTER — Encounter (HOSPITAL_BASED_OUTPATIENT_CLINIC_OR_DEPARTMENT_OTHER): Payer: PPO | Admitting: Internal Medicine

## 2022-05-20 DIAGNOSIS — L97522 Non-pressure chronic ulcer of other part of left foot with fat layer exposed: Secondary | ICD-10-CM | POA: Diagnosis not present

## 2022-05-20 DIAGNOSIS — L89623 Pressure ulcer of left heel, stage 3: Secondary | ICD-10-CM | POA: Diagnosis not present

## 2022-05-23 NOTE — Progress Notes (Signed)
[name], [name] ([id number]) [id number] Page 1 of 13 Visit Report for 05/20/2022 Debridement Details Patient Name: Date of Service: [name], [name] 05/20/2022 9:30 A M Medical Record Number: [id number] Patient Account Number: [id number] Date of Birth/Sex: Treating RN: 02-23-1976 (46 y.o. M) Primary Care Provider: [name] Other Clinician: Referring Provider: Treating Provider/Extender: [name] in Treatment: 96 Debridement Performed for Assessment: Wound #2 Left Calcaneus Performed By: Physician Ricard Dillon., MD Debridement Type: Debridement Level of Consciousness (Pre-procedure): Awake and Alert Pre-procedure Verification/Time Out Yes - 10:31 Taken: Start Time: 10:31 Pain Control: Lidocaine 4% T opical Solution T Area Debrided (L x W): otal 3 (cm) x 0.5 (cm) = 1.5 (cm) Tissue and other material debrided: Non-Viable, Callus, Slough, Subcutaneous, Slough Level: Skin/Subcutaneous Tissue Debridement Description: Excisional Instrument: Curette Bleeding: Minimum Hemostasis Achieved: Pressure End Time: 10:33 Procedural Pain: 0 Post Procedural Pain: 0 Response to Treatment: Procedure was tolerated well Level of Consciousness (Post- Awake and Alert procedure): Post Debridement Measurements of Total Wound Length: (cm) 3 Stage: Category/Stage III Width: (cm) 0.5 Depth: (cm) 0.3 Volume: (cm) 0.353 Character of Wound/Ulcer Post Debridement: Improved Post Procedure Diagnosis Same as Pre-procedure Notes Scribed for Dr. Dellia Nims by J.Scotton Electronic Signature(s) Signed: 05/22/2022 7:19:32 PM By: Linton Ham MD Entered By: Linton Ham on 05/20/2022 11:27:58 -------------------------------------------------------------------------------- HPI Details Patient Name: Date of Service: [name], [name] 05/20/2022 9:30 A M Medical Record Number: [id number] Patient Account Number: [id number] Date of Birth/Sex:  Treating RN: April 29, 1976 (46 y.o. M) Primary Care Provider: [name] Other Clinician: Referring Provider: Treating Provider/Extender: [name] in Treatment: [address] ([id number]) [id number] Page 2 of 13 History of Present Illness HPI Description: Wounds are12/03/2020 upon evaluation today patient presents for initial inspection here in our clinic concerning issues he has been having with the bottoms of his feet bilaterally. He states these actually occurred as wounds when he was hospitalized for 5 months secondary to Covid. He was apparently with tilting bed where he was in an upright position quite frequently and apparently this occurred in some way shape or form during that time. Fortunately there is no sign of active infection at this time. No fevers, chills, nausea, vomiting, or diarrhea. With that being said he still has substantial wounds on the plantar aspects of his feet Theragen require quite a bit of work to get these to heal. He has been using Santyl currently though that is been problematic both in receiving the medication as well as actually paid for it as it is become quite expensive. Prior to the experience with Covid the patient really did not have any major medical problems other than hypertension he does have some mild generalized weakness following the Covid experience. 07/22/2020 on evaluation today patient appears to be doing okay in regard to his foot ulcers I feel like the wound beds are showing signs of better improvement that I do believe the Iodoflex is helping in this regard. With that being said he does have a lot of drainage currently and this is somewhat blue/green in nature which is consistent with Pseudomonas. I do think a culture today would be appropriate for [place] to evaluate and see if that is indeed the case I would likely start him on antibiotic orally as well he is not allergic to Cipro knows of no  issues he has had in the past 12/21; patient was admitted to the clinic earlier this month with bilateral presumed pressure ulcers on  the bottom of his feet apparently related to excessive pressure from a tilt table arrangement in the intensive care unit. Patient relates this to being on ECMO but I am not really sure that is exactly related to that. I must say I have never seen anything like this. He has fairly extensive full-thickness wounds extending from his heel towards his midfoot mostly centered laterally. There is already been some healing distally. He does not appear to have an arterial issue. He has been using gentamicin to the wound surfaces with Iodoflex to help with ongoing debridement 1/6; this is a patient with pressure ulcers on the bottom of his feet related to excessive pressure from a standing position in the intensive care unit. He is complaining of a lot of pain in the right heel. He is not a diabetic. 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 We have been using Iodoflex to help prepare the surfaces of both wounds for an advanced treatment product. He is nonambulatory spending most of his time in a wheelchair I have asked him not to propel the wheelchair with his heels 1/13; in general his wounds look better not much surface area change we have been using Iodoflex as of last week. I did an x-ray of the right heel as the patient was complaining of pain. I had some thoughts about a stress fracture perhaps Achilles tendon problems however what it showed was erosive changes along the inferior aspect of the calcaneus he now has a MRI booked for 1/20. 1/20; in general his wounds continue to be better. Some improvement in the large narrow areas proximally in his foot. He is still complaining of pain in the right heel and tenderness in certain areas of this wound. His MRI is tonight. I am not just looking for osteomyelitis that was brought up on the x-ray I am  wondering about stress fractures, tendon ruptures etc. He has no such findings on the left. Also noteworthy is that the patient had critical illness neuropathy and some of the discomfort may be actual improvement in nerve function I am just not sure. These wounds were initially in the setting of severe critical illness related to COVID-19. He was put in a standing position. He may have also been on pressors at the point contributing to tissue ischemia. By his description at some point these wounds were grossly necrotic extending proximally up into the Achilles part of his heel. I do not know that I have ever really seen pictures of them like this although they may exist in epic We have ordered Tri layer Oasis. I am trying to stimulate some granulation in these areas. This is of course assuming the MRI is negative for infection 1/27; since the patient was last here he saw Dr. Juleen China of infectious disease. He is planned for vancomycin and ceftriaxone. Prior operative culture grew MSSA. Also ordered baseline lab work. He also ordered arterial studies although the ABIs in our clinic were normal as well as his clinical exam these were normal I do not think he needs to see vascular surgery. His ABIs at the P[facility] were 1.22 in the right triphasic waveforms with a normal TBI of 1.15 on the left ABI of 1.22 with triphasic waveforms and a normal TBI of 1.08. Finally he saw Dr. Amalia Hailey who will follow him in for 2 months. At this point I do not think he felt that he needed a procedure on the right calcaneal bone. Dr. Juleen China is elected for  broad-spectrum antibiotic The patient is still having pain in the right heel. He walks with a walker 2/3; wounds are generally smaller. He is tolerating his IV antibiotics. I believe this is vancomycin and ceftriaxone. We are still waiting for Oasis burn in terms of his out-of-pocket max which he should be meeting soon given the IV antibiotics, MRIs etc. I have asked him to check  in on this. We are using silver collagen in the meantime the wounds look better 2/10; tolerating IV vancomycin and Rocephin. We are waiting to apply for Oasis. Although I am not really sure where he is in his out-of-pocket max. 2/17 started the first application of Oasis trilayer. Still on antibiotics. The wounds have generally look better. The area on the left has a little more surface slough requiring debridement [id number]; second application of Oasis trilayer. The wound surface granulation is generally look better. The area on the left with undermining laterally I think is come in a bit. 10/08/2020 upon evaluation today patient is here today for [employer] application #3. Fortunately he seems to be doing extremely well with regard to this and we are seeing a lot of new epithelial growth which is great news. Fortunately there is no signs of active infection at this time. 10/16/2020 upon evaluation today patient appears to be doing well with regard to his foot ulcers. Do believe the Oasis has been of benefit for him. I do not see any signs of infection right now which is great news and I think that he has a lot of new epithelial growth which is great to see as well. The patient is very pleased to hear all of this. I do think we can proceed with the Oasis trilayer #4 today. 3/18; not as much improvement in these areas on his heels that I was hoping. I did reapply trilateral Oasis today the tissue looks healthier but not as much fill in as I was hoping. 3/25; better looking today I think this is come in a bit the tissue looks healthier. Triple layer Oasis reapplied #6 4/1; somewhat better looking definitely better looking surface not as much change in surface area as I was hoping. He may be spending more time [place] on days then he needs to although he does have heel offloading boots. Triple layer Oasis reapplied #7 4/7; unfortunately apparently [facility] will not approve any further Oasis  which is unfortunate since the patient did respond nicely both in terms of the condition of the wound bed as well as surface area. There is still some drainage coming from the wound but not a lot there does not appear to be any infection 4/15; we have been using Hydrofera Blue. He continues to have nice rims of epithelialization on the right greater than the left. The left the epithelialization is coming from the tip of his heel. There is moderate drainage. In this that concerns me about a total contact cast. There is no evidence of infection 4/29; patient has been using Hydrofera Blue with dressing changes. He has no complaints or issues today. 5/5; using Hydrofera Blue. I actually think that he looks marginally better than the last time I saw this 3 weeks ago. There are rims of epithelialization on the left thumb coming from the medial side on the right. Using Hydrofera Blue 5/12; using Hydrofera Blue. These continue to make improvements in surface area. His drainage was not listed as severe I therefore went ahead and put a cast on the left  foot. Right foot we will continue to dress his previous 5/16; back for first total contact cast change. He did not tolerate this particularly well cast injury on the anterior tibia among other issues. Difficulty sleeping. I talked him about this in some detail and afterwards is elected to continue. I told him I would like to have a cast on for 3 weeks to see if this is going to help at all. I think he agreed 5/19; I think the wound is better. There is no tunneling towards his midfoot. The undermining medially also looks better. He has a rim of new skin distally. I think we are making progress here. The area on the left also continues to look somewhat better to me using Hydrofera Blue. He has a list of complaints about the cast but none of them seem serious 5/26; patient presents for 1 week follow-up. He has been using a total contact cast and tolerating this  well. Hydrofera Blue is the main dressing used. He denies signs of infection. 6/2 Hydrofera Blue total contact cast on the left. These were large ulcers that formed in intensive care unit where the patient was recovering from [place]. May have had something to do with being ventilated in an upright positiono Pressors etc. We have been able to get the areas down considerably and a viable [name], [name] ([id number]) [id number] Page 3 of 13 surface. There is some epithelialization in both sides. Note made of drainage 6/9; changed to [facility] last time because of drainage. He arrives with better looking surfaces and dimensions on the left than the right. Paradoxically the right actually probes more towards his midfoot the left is largely close down but both of these look improved. Using a total contact cast on the left 6/16; complex wounds on his bilateral plantar heels which were initially pressure injury from a stay in the ICU with COVID. We have been using silver alginate most recently. His dimensions of come in quite dramatically however not recently. We have been putting the left foot in a total contact cast 6/23; complex wounds on the bilateral plantar heels. I been putting the left in the cast paradoxically the area on the right is the one that is going towards closure at a faster rate. Quite a bit of drainage on the left. The patient went to see Dr. Amalia Hailey who said he was going to standby for skin grafts. I had actually considered sending him for skin grafts however he would be mandatorily off his feet for a period of weeks to months. I am thinking that the area on the right is going to close on its own the area on the left has been more stubborn even though we have him in a total contact cast 6/30; took him out of a total contact cast last week is the right heel seem to be making better progress than the left where I was placing the cast. We are using silver  alginate. Both wounds are smaller right greater than left 7/12; both wounds look as though they are making some progress. We are using silver alginate. Heel offloading boots 7/26; very gradual progress especially on the right. Using silver alginate. He is wearing heel offloading boots 8/18; he continues to close these wounds down very gradually. Using silver alginate. The problem polymen being definitive about this is areas of what appears to be callus around the margins. This is not a 100% of the area but certainly sizable especially on the right 9/1;  bilateral plantar feet wounds secondary to prolonged pressure while being ventilated for COVID-19 in an upright position. Essentially pressure ulcers on the bottom of his feet. He is made substantial progress using silver alginate. 9/14; bilateral plantar feet wounds secondary to prolonged pressure. Making progress using silver alginate. 9/29 bilateral plantar feet wounds secondary to prolonged pressure. I changed him to Iodoflex last week. MolecuLight showing reddened blush fluorescence 10/11; patient presents for follow-up. He has no issues or complaints today. He denies signs of infection. He continues to use Iodoflex and antibiotic ointment to the wound beds. 10/27; 2-week follow-up. No evidence of infection. He has callus and thick dry skin around the wound margins we have been using Iodoflex and Bactroban which was in response to a moderate left MolecuLight reddish blush fluorescence. 11/10; 2-week follow-up. Wound margins again have thick callus however the measurements of the actual wound sites are a lot smaller. Everything looks reasonably healthy here. We have been using Iodoflex He was approved for prime matrix but I have elected to delay this given the improvement in the surface area. Hopefully I will not regret that decision as were getting close to the end of the year in terms of insurance payment 12/8; 2-week follow-up. Wounds are  generally smaller in size. These were initially substantial wounds extending into the forefoot all the way into the heel on the bilateral plantar feet. They are now both located on the plantar heel distal aspect both of these have a lot of callus around the wounds I used a #5 curette to remove this on the right and the left also some subcutaneous debris to try and get the wound edges were using Iodoflex. He has heel offloading shoe 12/22; 2-week follow-up. Not really much improvement. He has thick callus around the outer edges of both wounds. I remove this there is some nonviable subcutaneous tissue as well. We have been using Iodoflex. Her intake nurse and myself spontaneously thought of a total contact cast I went back in May. At that time we really were not seeing much of an improvement with a cast although the wound was in a much different situation I would like to retry this in 2 weeks and I discussed this with the patient 08/12/2021; the patient has had some improvement with the Iodoflex. The the area on the left heel plantar more improved than the right. I had to put him in a total contact cast on the left although I decided to put that off for 2 weeks. I am going to change his primary dressing to silver collagen. I think in both areas he has had some improvement most of the healing seems to be more proximal in the heel. The wounds are in the mid aspect. A lot of thick callus on the right heel however. 1/19; we are using silver collagen on both plantar heel areas. He has had some improvement today. The left did not require any debridement. He still had some eschar on the right that was debrided but both seem to have contracted. I did not put it total contact cast on him today 2/2 we have been using silver collagen. The area on the right plantar heel has areas that appear to be epithelialized interspersed with dry flaking callus and dry skin. I removed this. This really looks better than on the  other side. On the left still a large area with raised edges and debris on the surface. The patient states he is in the heel offloading boots for  a prolonged period of time and really does not use any other footwear 2/6; patient presents for first cast exchange. He has no issues or complaints today. 2/9; not much change in the left foot wound with 1 week of a cast we are using silver collagen. Silver collagen on the right side. The right side has been the better wound surface. We will reapply the total contact cast on the left 2/16; not much improvement on either side I been using silver collagen with a total contact cast on the left. I'm changing the Hydrofera Blue still with a total contact cast on the left 2/23; some improvement on both sides. Disappointing that he has thick callus around the area that we are putting in a total contact cast on the left. We've been using Hydrofera Blue on both wound areas. This is a man who at essentially pressure ulcers in addition to ischemia caused by medications to support his blood pressure (pressors) in the ICU. He was being ventilated in the standing position for severe Covid. A Shiley the wounds extended across his entire foot but are now localized to his plantar heels bilaterally. We have made progress however neither areas healed. I continue to think the total contact cast is helped albeit painstakingly slowly. He has never wanted a plastic surgery consult although I don't know that they would be interested in grafting in area in this location. 10/07/2021: Continued improvement bilaterally. There is still some callus around the left wound, despite the total contact cast. He has some increased pain in his right midfoot around 1 particular area. This has been painful in the past but seems to be a little bit worse. When his cast was removed today, there was an area on the heel of the left foot that looks a bit macerated. He is also complaining of pain in his  left thigh and hip which he thinks is secondary to the limb length discrepancy caused by the cast. 10/14/2021: He continues to improve. A little bit less callus around the left wound. He continues to endorse pain in his right midfoot, but this is not as significant as it was last week. The maceration on his left heel is improved. 10/21/2021: Continued improvement to both wounds. The maceration on his left heel is no longer evident. Less callus bilaterally. Epithelialization progressing. 10/28/2021: Significant improvement this week. The right sided wound is nearly closed with just a small open area at the middle. No maceration seen on the left heel. Continued epithelialization on both sides. No concern for infection. 11/04/2021: T oday, the wounds were measured a little bit differently and come out as larger, but I actually think they are about the same to potentially even smaller, particularly on the left. He continues to accumulate some callus on the right. 11/11/2021: T oday, the patient is expressing some concern that the left wound, despite being in the total contact cast, is not progressing at the same rate as the 1 on the right. He is interested in trying a week without the cast to see how the wound does. The wounds are roughly the same size as last week, with the right perhaps being a little bit smaller. He continues to build up callus on both sites. 11/18/2021: Last week, I permitted the patient to go without his total contact cast, just to see if the cast was really making any difference. Today, both wounds have deteriorated to some extent, suggesting that the cast is providing benefit, at least on the left.  Both are larger and have accumulated callus, slough, and other debris. 11/26/2021: I debrided both wounds quite aggressively last week in an effort to stimulate the healing cascade. This appears to have been effective as the left sided wound is a full centimeter shorter in length. Although the  right was measured slightly larger, on inspection, it looks as though an area of epithelialized tissue was included in the measurements. We have been using PolyMem Ag on the wound surfaces with a total contact cast to the left. [name], [name] ([id number]) [id number] Page 4 of 13 12/02/2021: It appears that the intake personnel are including epithelialized tissue in his wound measurements; the right wound is almost completely epithelialized; there is just a crater at the proximal midfoot with some open areas. On the left, he has built up some callus, but the overall wound surface looks good. There is some senescent skin around the wound margin. He has been in PolyMem Ag bilaterally with a total contact cast on the left. 12/09/2021: The right wound is nearly closed; there is just a small open area at the mid calcaneus. On the left, the wound is smaller with minimal callus buildup. No significant drainage. 12/16/2021: The right calcaneal wound remains minimally open at the mid calcaneus; the rest has epithelialized. On the left, the wound is also a little bit smaller. There is some senescent tissue on the periphery. He is getting his first application of a trial skin substitute called Vendaje today. 12/23/2021: The wound on his right calcaneus is nearly closed; there is just a small area at the most distal aspect of the calcaneus that is open. On the left, the area where we applied to the skin substitute has a healthier-looking bed of granulation tissue. The wound dimensions are not significantly different on this side but the wound surface is improved. 12/30/2021: The wound on the right calcaneus has not changed significantly aside from some accumulation of callus. On the left, the open area is smaller and continues to have an improved surface. He continues to accumulate callus around the wound. He is here for his third application of Vendaje. 01/06/2022: The right calcaneal wound  is down to just a couple of millimeters. He continues to accumulate periwound callus. He unfortunately got his cast wet earlier in the week and his left foot is macerated, resulting in some superficial skin loss just distal to the open wound. The open wound itself, however, is much smaller and has a healthier appearing surface. He is here for his fourth application of Vendaje. 01/13/2022: The right calcaneal wound is about the same. Unfortunately, once again, his cast got wet and his foot is again macerated. This is caused the left calcaneal wound to enlarge. He is here for his fifth application of Vendaje. 01/20/2022: The right calcaneal wound is very small. There is some periwound callus accumulation. He purchased a new cast protector last week and this has been effective in avoiding the maceration that has been occurring on the left. The left calcaneal wound is narrower and has a healthy and viable-appearing surface. He is here for his 6 application of Vendaje. 01/27/2022: The right calcaneal wound is down to just a pinhole. There is some periwound slough and callus. On the left, the wound is narrower and shorter by about a centimeter. The surface is robust and viable-appearing. Unfortunately, the rep for the trial skin substitute product did not provide any for [place] to use today. 02/04/2022: The right calcaneal wound remains unchanged. There is more  accumulated callus. On the left, although the intake nurse measured it a little bit longer, it looks about the same to me. The surface has a layer of slough, but underneath this, there is good granulation tissue. 02/10/2022: The right calcaneus wound is nearly closed. There is still some callus that builds up around the site. The left side looks about the same in terms of dimensions, but the surface is more robust and vital-appearing. 02/16/2022: The area of the right calcaneus that was nearly closed last week has closed, but there is a small opening at the mid  foot where it looks like some moisture got retained and caused some reopening. The left foot wound is narrower and shallower. Both sites have a fair amount of periwound callus and eschar. 02/24/2022: The small midfoot opening on the right calcaneus is a little bit smaller today. The left foot wound is narrower and shallower. He continues to accumulate periwound callus. No concern for infection. 03/01/2022: The patient came to clinic early because he showered and got his cast wet. Fortunately, there is no significant maceration to his foot but the callus softened and it looks like the wound on his left calcaneus may be a little bit wider. The wound on his right calcaneus is just a narrow slit. Continued accumulation of periwound callus bilaterally. 03/08/2022: The wound on his right calcaneus is very nearly closed, just a small pinpoint opening under a bit of eschar; the left wound has come in quite a bit, as well. It is narrower and shorter than at our last visit. Still with accumulated callus and eschar bilaterally. 03/17/2022: The right calcaneal wound is healed. The left wound is smaller and the surface itself is very clean, but there is some blue-green staining on the periwound callus, concerning for Pseudomonas aeruginosa. 03/23/2022: The right calcaneal wound remains closed. The left wound continues to contract. No further blue-green staining. Small amount of callus and slough accumulation. 03/28/2022: He came in early today because he had gotten his cast wet. On inspection, the wound itself did not get wet or macerated, just a little bit of the forefoot. The wound itself is basically unchanged. 04/07/2022: The right foot wound remains closed. The left wound is the smallest that I have seen it to date. It is narrower and shorter. It still continues to accumulate slough on the surface. 04/15/2022: There is a band of epithelium now dividing the small left plantar foot wound in 2. There is still some  slough on the surface. 04/21/2022: The wound continues to narrow. Just a little bit of slough on the surface. He seems to be responding well to endoform. 04/28/2022: Continued slow contraction of the wound. There is a little slough on the surface and some periwound callus. We have been using endoform and total contact cast. 05/05/2022: The wound appears to have stalled. There is slough and some periwound eschar/callus. No concern for infection, however. 05/12/2022: Unfortunately, his right foot has reopened. It is located at the most posterior aspect of his surgical incision. The area was noted to have drainage coming from it when his padding was removed today. Underneath some callus and senescent skin, there is an opening. No purulent drainage or malodor. On the left foot, the wound is again unchanged. There is some light blue staining on the callus, but no malodor or purulent drainage. 10/13; right and left heel remanence of extensive plantar foot wounds. These are better than I remember by quite a big margin however he is still left  with wounds on the left plantar heel and the right plantar heel. Been using endoform bilaterally. A culture was done that showed apparently Pseudomonas but we are still waiting for the [facility] antibiotic to use gentamicin today. He is still very active by description I am not sure about the offloading of his noncasted right foot Electronic Signature(s) Signed: 05/22/2022 7:19:32 PM By: Linton Ham MD Entered By: Linton Ham on 05/20/2022 11:30:02 [name] ([id number]) [id number] Page 5 of 13 -------------------------------------------------------------------------------- Physical Exam Details Patient Name: Date of Service: [name] [name] 05/20/2022 9:30 A M Medical Record Number: [id number] Patient Account Number: [id number] Date of Birth/Sex: Treating RN: 12/16/1975 (46 y.o. M) Primary Care Provider: [name] Other  Clinician: Referring Provider: Treating Provider/Extender: [name] in Treatment: 96 Constitutional Sitting or standing Blood Pressure is within target range for patient.. Pulse regular and within target range for patient.Marland Kitchen Respirations regular, non-labored and within target range.. Temperature is normal and within the target range for the patient.Marland Kitchen Appears in no distress. Notes Wound exam; the patient has wounds on the left greater than right plantar heels. Neither one of them look infected although there is thick callus skin around both wound areas. I remove this on the left some debris on the wound surface. No debridement on the right. Fortunately there is no evidence of infection this does not probe to bone Electronic Signature(s) Signed: 05/22/2022 7:19:32 PM By: Linton Ham MD Entered By: Linton Ham on 05/20/2022 11:32:56 -------------------------------------------------------------------------------- Physician Orders Details Patient Name: Date of Service: [name], [name] 05/20/2022 9:30 A M Medical Record Number: [id number] Patient Account Number: [id number] Date of Birth/Sex: Treating RN: Jan 26, 1976 (46 y.o. [name] Primary Care Provider: [name] Other Clinician: Referring Provider: Treating Provider/Extender: [name] in Treatment: 43 Verbal / Phone Orders: No Diagnosis Coding Follow-up Appointments ppointment in 1 week. - Dr [name] Room 3 Return A Anesthetic Wound #2 Left Calcaneus (In clinic) Topical Lidocaine 4% applied to wound bed - In clinic Bathing/ Shower/ Hygiene May shower with protection but do not get wound dressing(s) wet. - Cover with cast protector (can purchase cast protector at [facility] or [facility] ) Edema Control - Lymphedema / SCD / Other Bilateral Lower Extremities Elevate legs to the level of the heart or above for 30 minutes daily and/or when sitting, a frequency of: -  throughout the day Avoid standing for long periods of time. Moisturize legs daily. - right leg and foot every night. Off-Loading Total Contact Cast to Left Lower Extremity - TCC to Left LL Other: - keep pressure off of the bottom of your feet Additional Orders / Instructions Follow Nutritious Diet Non Wound Condition Right Lower Extremity Other Non Wound Condition Orders/Instructions: - Right Heel-Use Opifoam and Border Foam and Kerlix/Medipore soft cloth surgical tape (4x10 in/yd) for Non wound on Right Heel. [name], [name] ([id number]) [id number] Page 6 of 13 Wound Treatment Wound #1 - Calcaneus Wound Laterality: Right Cleanser: Normal Saline (Generic) Every Other Day/30 Days Discharge Instructions: Cleanse the wound with Normal Saline prior to applying a clean dressing using gauze sponges, not tissue or cotton balls. Cleanser: Wound Cleanser (DME) (Generic) Every Other Day/30 Days Discharge Instructions: Cleanse the wound with wound cleanser prior to applying a clean dressing using gauze sponges, not tissue or cotton balls. Prim Dressing: Endoform 2x2 in (DME) (Generic) Every Other Day/30 Days ary Discharge Instructions: Moisten with saline Secondary Dressing: ALLEVYN Heel 4 1/2in x 5 1/2in /  10.5cm x 13.5cm (DME) (Generic) Every Other Day/30 Days Discharge Instructions: Apply over primary dressing as directed. Secondary Dressing: Woven Gauze Sponge, Non-Sterile 4x4 in Every Other Day/30 Days Discharge Instructions: Apply over primary dressing as directed. Secured With: Child psychotherapist, Sterile 2x75 (in/in) (DME) (Generic) Every Other Day/30 Days Discharge Instructions: Secure with stretch gauze as directed. Secured With: 64M Medipore H Soft Cloth Surgical T ape, 4 x 10 (in/yd) (DME) (Generic) Every Other Day/30 Days Discharge Instructions: Secure with tape as directed. Wound #2 - Calcaneus Wound Laterality: Left Cleanser: Normal Saline  (Generic) 1 x Per Week/30 Days Discharge Instructions: Cleanse the wound with Normal Saline prior to applying a clean dressing using gauze sponges, not tissue or cotton balls. Cleanser: Wound Cleanser 1 x Per Week/30 Days Discharge Instructions: Cleanse the wound with wound cleanser prior to applying a clean dressing using gauze sponges, not tissue or cotton balls. Topical: Gentamicin 1 x Per Week/30 Days Discharge Instructions: As directed by physician Topical: TCC 1 x Per Week/30 Days Discharge Instructions: to left leg Prim Dressing: Endoform 2x2 in 1 x Per Week/30 Days ary Discharge Instructions: Moisten with saline Secondary Dressing: Optifoam Non-Adhesive Dressing, 4x4 in (Generic) 1 x Per Week/30 Days Discharge Instructions: Apply over primary dressing as directed. Secondary Dressing: Woven Gauze Sponge, Non-Sterile 4x4 in 1 x Per Week/30 Days Discharge Instructions: Apply over primary dressing as directed. Secured With: 64M Medipore H Soft Cloth Surgical T ape, 4 x 10 (in/yd) (Generic) 1 x Per Week/30 Days Discharge Instructions: Secure with tape as directed. Electronic Signature(s) Signed: 05/20/2022 5:29:53 PM By: [name] Signed: 05/22/2022 7:19:32 PM By: Linton Ham MD Previous Signature: 05/20/2022 5:24:15 PM Version By: [name] Entered By: [name] on 05/20/2022 17:27:11 -------------------------------------------------------------------------------- Problem List Details Patient Name: Date of Service: [name], [name] 05/20/2022 9:30 A M Medical Record Number: [id number] Patient Account Number: [id number] Date of Birth/Sex: Treating RN: November 09, 1975 (47 y.o. M) Primary Care Provider: [name] Other Clinician: Referring Provider: Treating Provider/Extender: [name] in Treatment: [address] [place], [place] ([id number]) [id number] Page 7 of 13 ICD-10 Encounter Code  Description Active Date MDM Diagnosis L97.512 Non-pressure chronic ulcer of other part of right foot with fat layer exposed 09/03/2020 No Yes L97.522 Non-pressure chronic ulcer of other part of left foot with fat layer exposed 09/03/2020 No Yes Inactive Problems ICD-10 Code Description Active Date Inactive Date L89.893 Pressure ulcer of other site, stage 3 07/15/2020 07/15/2020 M62.81 Muscle weakness (generalized) 07/15/2020 07/15/2020 I10 Essential (primary) hypertension 07/15/2020 07/15/2020 M86.171 Other acute osteomyelitis, right ankle and foot 09/03/2020 09/03/2020 Resolved Problems Electronic Signature(s) Signed: 05/22/2022 7:19:32 PM By: Linton Ham MD Entered By: Linton Ham on 05/20/2022 11:27:39 -------------------------------------------------------------------------------- Progress Note Details Patient Name: Date of Service: [name], [place]. 05/20/2022 9:30 A M Medical Record Number: [id number] Patient Account Number: [id number] Date of Birth/Sex: Treating RN: [birth year] (46 y.o. M) Primary Care Provider: [name] Other Clinician: Referring Provider: Treating Provider/Extender: [name] in Treatment: 96 Subjective History of Present Illness (HPI) Wounds are12/03/2020 upon evaluation today patient presents for initial inspection here in our clinic concerning issues he has been having with the bottoms of his feet bilaterally. He states these actually occurred as wounds when he was hospitalized for 5 months secondary to Covid. He was apparently with tilting bed where he was in an upright position quite frequently and apparently this occurred in some way shape or form during that  time. Fortunately there is no sign of active infection at this time. No fevers, chills, nausea, vomiting, or diarrhea. With that being said he still has substantial wounds on the plantar aspects of his feet Theragen require quite a bit of work to get these to heal. He has  been using Santyl currently though that is been problematic both in receiving the medication as well as actually paid for it as it is become quite expensive. Prior to the experience with Covid the patient really did not have any major medical problems other than hypertension he does have some mild generalized weakness following the Covid experience. 07/22/2020 on evaluation today patient appears to be doing okay in regard to his foot ulcers I feel like the wound beds are showing signs of better improvement that I do believe the Iodoflex is helping in this regard. With that being said he does have a lot of drainage currently and this is somewhat blue/green in nature which is consistent with Pseudomonas. I do think a culture today would be appropriate for [place] to evaluate and see if that is indeed the case I would likely start him on antibiotic orally as well he is not allergic to Cipro knows of no issues he has had in the past 12/21; patient was admitted to the clinic earlier this month with bilateral presumed pressure ulcers on the bottom of his feet apparently related to excessive pressure from a tilt table arrangement in the intensive care unit. Patient relates this to being on ECMO but I am not really sure that is exactly related to that. I must say I have never seen anything like this. He has fairly extensive full-thickness wounds extending from his heel towards his midfoot mostly centered laterally. There is already been some healing distally. He does not appear to have an arterial issue. He has been using gentamicin to the wound surfaces with Iodoflex to help with ongoing debridement 1/6; this is a patient with pressure ulcers on the bottom of his feet related to excessive pressure from a standing position in the intensive care unit. He is complaining of a lot of pain in the right heel. He is not a diabetic. 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 We have been  using Iodoflex to help prepare the surfaces of both wounds for an advanced treatment product. He is nonambulatory spending most of his time in a wheelchair I have asked him not to propel the wheelchair with his heels 1/13; in general his wounds look better not much surface area change we have been using Iodoflex as of last week. [name], [name] ([id number]) [id number] Page 8 of 13 I did an x-ray of the right heel as the patient was complaining of pain. I had some thoughts about a stress fracture perhaps Achilles tendon problems however what it showed was erosive changes along the inferior aspect of the calcaneus he now has a MRI booked for 1/20. 1/20; in general his wounds continue to be better. Some improvement in the large narrow areas proximally in his foot. He is still complaining of pain in the right heel and tenderness in certain areas of this wound. His MRI is tonight. I am not just looking for osteomyelitis that was brought up on the x-ray I am wondering about stress fractures, tendon ruptures etc. He has no such findings on the left. Also noteworthy is that the patient had critical illness neuropathy and some of the discomfort may be actual improvement in nerve function  I am just not sure. These wounds were initially in the setting of severe critical illness related to COVID-19. He was put in a standing position. He may have also been on pressors at the point contributing to tissue ischemia. By his description at some point these wounds were grossly necrotic extending proximally up into the Achilles part of his heel. I do not know that I have ever really seen pictures of them like this although they may exist in epic We have ordered Tri layer Oasis. I am trying to stimulate some granulation in these areas. This is of course assuming the MRI is negative for infection 1/27; since the patient was last here he saw Dr. Earlene Plater of infectious disease. He is planned for  vancomycin and ceftriaxone. Prior operative culture grew MSSA. Also ordered baseline lab work. He also ordered arterial studies although the ABIs in our clinic were normal as well as his clinical exam these were normal I do not think he needs to see vascular surgery. His ABIs at the [facility] were 1.22 in the right triphasic waveforms with a normal TBI of 1.15 on the left ABI of 1.22 with triphasic waveforms and a normal TBI of 1.08. Finally he saw Dr. Logan Bores who will follow him in for 2 months. At this point I do not think he felt that he needed a procedure on the right calcaneal bone. Dr. Earlene Plater is elected for broad-spectrum antibiotic The patient is still having pain in the right heel. He walks with a walker 2/3; wounds are generally smaller. He is tolerating his IV antibiotics. I believe this is vancomycin and ceftriaxone. We are still waiting for Oasis burn in terms of his out-of-pocket max which he should be meeting soon given the IV antibiotics, MRIs etc. I have asked him to check in on this. We are using silver collagen in the meantime the wounds look better 2/10; tolerating IV vancomycin and Rocephin. We are waiting to apply for Oasis. Although I am not really sure where he is in his out-of-pocket max. 2/17 started the first application of Oasis trilayer. Still on antibiotics. The wounds have generally look better. The area on the left has a little more surface slough requiring debridement 2/24; second application of Oasis trilayer. The wound surface granulation is generally look better. The area on the left with undermining laterally I think is come in a bit. 10/08/2020 upon evaluation today patient is here today for [employer] application #3. Fortunately he seems to be doing extremely well with regard to this and we are seeing a lot of new epithelial growth which is great news. Fortunately there is no signs of active infection at this time. 10/16/2020 upon evaluation today patient appears to  be doing well with regard to his foot ulcers. Do believe the Oasis has been of benefit for him. I do not see any signs of infection right now which is great news and I think that he has a lot of new epithelial growth which is great to see as well. The patient is very pleased to hear all of this. I do think we can proceed with the Oasis trilayer #4 today. 3/18; not as much improvement in these areas on his heels that I was hoping. I did reapply trilateral Oasis today the tissue looks healthier but not as much fill in as I was hoping. 3/25; better looking today I think this is come in a bit the tissue looks healthier. Triple layer Oasis reapplied #6 4/1; somewhat better  looking definitely better looking surface not as much change in surface area as I was hoping. He may be spending more time [place] on days then he needs to although he does have heel offloading boots. Triple layer Oasis reapplied #7 4/7; unfortunately apparently [facility] will not approve any further Oasis which is unfortunate since the patient did respond nicely both in terms of the condition of the wound bed as well as surface area. There is still some drainage coming from the wound but not a lot there does not appear to be any infection 4/15; we have been using Hydrofera Blue. He continues to have nice rims of epithelialization on the right greater than the left. The left the epithelialization is coming from the tip of his heel. There is moderate drainage. In this that concerns me about a total contact cast. There is no evidence of infection 4/29; patient has been using Hydrofera Blue with dressing changes. He has no complaints or issues today. 5/5; using Hydrofera Blue. I actually think that he looks marginally better than the last time I saw this 3 weeks ago. There are rims of epithelialization on the left thumb coming from the medial side on the right. Using Hydrofera Blue 5/12; using Hydrofera Blue. These continue to  make improvements in surface area. His drainage was not listed as severe I therefore went ahead and put a cast on the left foot. Right foot we will continue to dress his previous 5/16; back for first total contact cast change. He did not tolerate this particularly well cast injury on the anterior tibia among other issues. Difficulty sleeping. I talked him about this in some detail and afterwards is elected to continue. I told him I would like to have a cast on for 3 weeks to see if this is going to help at all. I think he agreed 5/19; I think the wound is better. There is no tunneling towards his midfoot. The undermining medially also looks better. He has a rim of new skin distally. I think we are making progress here. The area on the left also continues to look somewhat better to me using Hydrofera Blue. He has a list of complaints about the cast but none of them seem serious 5/26; patient presents for 1 week follow-up. He has been using a total contact cast and tolerating this well. Hydrofera Blue is the main dressing used. He denies signs of infection. 6/2 Hydrofera Blue total contact cast on the left. These were large ulcers that formed in intensive care unit where the patient was recovering from [place]. May have had something to do with being ventilated in an upright positiono Pressors etc. We have been able to get the areas down considerably and a viable surface. There is some epithelialization in both sides. Note made of drainage 6/9; changed to [facility] last time because of drainage. He arrives with better looking surfaces and dimensions on the left than the right. Paradoxically the right actually probes more towards his midfoot the left is largely close down but both of these look improved. Using a total contact cast on the left 6/16; complex wounds on his bilateral plantar heels which were initially pressure injury from a stay in the ICU with COVID. We have been using silver  alginate most recently. His dimensions of come in quite dramatically however not recently. We have been putting the left foot in a total contact cast 6/23; complex wounds on the bilateral plantar heels. I been putting  the left in the cast paradoxically the area on the right is the one that is going towards closure at a faster rate. Quite a bit of drainage on the left. The patient went to see Dr. Amalia Hailey who said he was going to standby for skin grafts. I had actually considered sending him for skin grafts however he would be mandatorily off his feet for a period of weeks to months. I am thinking that the area on the right is going to close on its own the area on the left has been more stubborn even though we have him in a total contact cast 6/30; took him out of a total contact cast last week is the right heel seem to be making better progress than the left where I was placing the cast. We are using silver alginate. Both wounds are smaller right greater than left 7/12; both wounds look as though they are making some progress. We are using silver alginate. Heel offloading boots 7/26; very gradual progress especially on the right. Using silver alginate. He is wearing heel offloading boots 8/18; he continues to close these wounds down very gradually. Using silver alginate. The problem polymen being definitive about this is areas of what appears to be callus around the margins. This is not a 100% of the area but certainly sizable especially on the right 9/1; bilateral plantar feet wounds secondary to prolonged pressure while being ventilated for COVID-19 in an upright position. Essentially pressure ulcers on the bottom of his feet. He is made substantial progress using silver alginate. 9/14; bilateral plantar feet wounds secondary to prolonged pressure. Making progress using silver alginate. 9/29 bilateral plantar feet wounds secondary to prolonged pressure. I changed him to Iodoflex last week. MolecuLight  showing reddened blush fluorescence 10/11; patient presents for follow-up. He has no issues or complaints today. He denies signs of infection. He continues to use Iodoflex and antibiotic ointment to the wound beds. 10/27; 2-week follow-up. No evidence of infection. He has callus and thick dry skin around the wound margins we have been using Iodoflex and Bactroban which was in response to a moderate left MolecuLight reddish blush fluorescence. [name], CHAVEZ ([id number]) [id number] Page 9 of 13 11/10; 2-week follow-up. Wound margins again have thick callus however the measurements of the actual wound sites are a lot smaller. Everything looks reasonably healthy here. We have been using Iodoflex He was approved for prime matrix but I have elected to delay this given the improvement in the surface area. Hopefully I will not regret that decision as were getting close to the end of the year in terms of insurance payment 12/8; 2-week follow-up. Wounds are generally smaller in size. These were initially substantial wounds extending into the forefoot all the way into the heel on the bilateral plantar feet. They are now both located on the plantar heel distal aspect both of these have a lot of callus around the wounds I used a #5 curette to remove this on the right and the left also some subcutaneous debris to try and get the wound edges were using Iodoflex. He has heel offloading shoe 12/22; 2-week follow-up. Not really much improvement. He has thick callus around the outer edges of both wounds. I remove this there is some nonviable subcutaneous tissue as well. We have been using Iodoflex. Her intake nurse and myself spontaneously thought of a total contact cast I went back in May. At that time we really were not seeing much of an improvement with a  cast although the wound was in a much different situation I would like to retry this in 2 weeks and I discussed this with the  patient 08/12/2021; the patient has had some improvement with the Iodoflex. The the area on the left heel plantar more improved than the right. I had to put him in a total contact cast on the left although I decided to put that off for 2 weeks. I am going to change his primary dressing to silver collagen. I think in both areas he has had some improvement most of the healing seems to be more proximal in the heel. The wounds are in the mid aspect. A lot of thick callus on the right heel however. 1/19; we are using silver collagen on both plantar heel areas. He has had some improvement today. The left did not require any debridement. He still had some eschar on the right that was debrided but both seem to have contracted. I did not put it total contact cast on him today 2/2 we have been using silver collagen. The area on the right plantar heel has areas that appear to be epithelialized interspersed with dry flaking callus and dry skin. I removed this. This really looks better than on the other side. On the left still a large area with raised edges and debris on the surface. 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 He has no issues or complaints today. 2/9; not much change in the left foot wound with 1 week of a cast we are using silver collagen. Silver collagen on the right side. The right side has been the better wound surface. We will reapply the total contact cast on the left 2/16; not much improvement on either side I been using silver collagen with a total contact cast on the left. I'm changing the Hydrofera Blue still with a total contact cast on the left 2/23; some improvement on both sides. Disappointing that he has thick callus around the area that we are putting in a total contact cast on the left. We've been using Hydrofera Blue on both wound areas. This is a  man who at essentially pressure ulcers in addition to ischemia caused by medications to support his blood pressure (pressors) in the ICU. He was being ventilated in the standing position for severe Covid. A Shiley the wounds extended across his entire foot but are now localized to his plantar heels bilaterally. We have made progress however neither areas healed. I continue to think the total contact cast is helped albeit painstakingly slowly. He has never wanted a plastic surgery consult although I don't know that they would be interested in grafting in area in this location. 10/07/2021: Continued improvement bilaterally. There is still some callus around the left wound, despite the total contact cast. He has some increased pain in his right midfoot around 1 particular area. This has been painful in the past but seems to be a little bit worse. When his cast was removed today, there was an area on the heel of the left foot that looks a bit macerated. He is also complaining of pain in his left thigh and hip which he thinks is secondary to the limb length discrepancy caused by the cast. 10/14/2021: He continues to improve. A little bit less callus around the left wound. He continues to endorse pain in his right  midfoot, but this is not as significant as it was last week. The maceration on his left heel is improved. 10/21/2021: Continued improvement to both wounds. The maceration on his left heel is no longer evident. Less callus bilaterally. Epithelialization progressing. 10/28/2021: Significant improvement this week. The right sided wound is nearly closed with just a small open area at the middle. No maceration seen on the left heel. Continued epithelialization on both sides. No concern for infection. 11/04/2021: T oday, the wounds were measured a little bit differently and come out as larger, but I actually think they are about the same to potentially even smaller, particularly on the left. He continues to  accumulate some callus on the right. 11/11/2021: T oday, the patient is expressing some concern that the left wound, despite being in the total contact cast, is not progressing at the same rate as the 1 on the right. He is interested in trying a week without the cast to see how the wound does. The wounds are roughly the same size as last week, with the right perhaps being a little bit smaller. He continues to build up callus on both sites. 11/18/2021: Last week, I permitted the patient to go without his total contact cast, just to see if the cast was really making any difference. Today, both wounds have deteriorated to some extent, suggesting that the cast is providing benefit, at least on the left. Both are larger and have accumulated callus, slough, and other debris. 11/26/2021: I debrided both wounds quite aggressively last week in an effort to stimulate the healing cascade. This appears to have been effective as the left sided wound is a full centimeter shorter in length. Although the right was measured slightly larger, on inspection, it looks as though an area of epithelialized tissue was included in the measurements. We have been using PolyMem Ag on the wound surfaces with a total contact cast to the left. 12/02/2021: It appears that the intake personnel are including epithelialized tissue in his wound measurements; the right wound is almost completely epithelialized; there is just a crater at the proximal midfoot with some open areas. On the left, he has built up some callus, but the overall wound surface looks good. There is some senescent skin around the wound margin. He has been in PolyMem Ag bilaterally with a total contact cast on the left. 12/09/2021: The right wound is nearly closed; there is just a small open area at the mid calcaneus. On the left, the wound is smaller with minimal callus buildup. No significant drainage. 12/16/2021: The right calcaneal wound remains minimally open at the mid  calcaneus; the rest has epithelialized. On the left, the wound is also a little bit smaller. There is some senescent tissue on the periphery. He is getting his first application of a trial skin substitute called Vendaje today. 12/23/2021: The wound on his right calcaneus is nearly closed; there is just a small area at the most distal aspect of the calcaneus that is open. On the left, the area where we applied to the skin substitute has a healthier-looking bed of granulation tissue. The wound dimensions are not significantly different on this side but the wound surface is improved. 12/30/2021: The wound on the right calcaneus has not changed significantly aside from some accumulation of callus. On the left, the open area is smaller and continues to have an improved surface. He continues to accumulate callus around the wound. He is here for his third application of Vendaje. 01/06/2022: The  right calcaneal wound is down to just a couple of millimeters. He continues to accumulate periwound callus. He unfortunately got his cast wet earlier in the week and his left foot is macerated, resulting in some superficial skin loss just distal to the open wound. The open wound itself, however, is much smaller and has a healthier appearing surface. He is here for his fourth application of Vendaje. 01/13/2022: The right calcaneal wound is about the same. Unfortunately, once again, his cast got wet and his foot is again macerated. This is caused the left calcaneal wound to enlarge. He is here for his fifth application of Vendaje. 01/20/2022: The right calcaneal wound is very small. There is some periwound callus accumulation. He purchased a new cast protector last week and this has TAVIEN, RICARDEZ ([id number]) [id number] Page 10 of 13 been effective in avoiding the maceration that has been occurring on the left. The left calcaneal wound is narrower and has a healthy and viable-appearing surface. He  is here for his 6 application of Vendaje. 01/27/2022: The right calcaneal wound is down to just a pinhole. There is some periwound slough and callus. On the left, the wound is narrower and shorter by about a centimeter. The surface is robust and viable-appearing. Unfortunately, the rep for the trial skin substitute product did not provide any for [place] to use today. 02/04/2022: The right calcaneal wound remains unchanged. There is more accumulated callus. On the left, although the intake nurse measured it a little bit longer, it looks about the same to me. The surface has a layer of slough, but underneath this, there is good granulation tissue. 02/10/2022: The right calcaneus wound is nearly closed. There is still some callus that builds up around the site. The left side looks about the same in terms of dimensions, but the surface is more robust and vital-appearing. 02/16/2022: The area of the right calcaneus that was nearly closed last week has closed, but there is a small opening at the mid foot where it looks like some moisture got retained and caused some reopening. The left foot wound is narrower and shallower. Both sites have a fair amount of periwound callus and eschar. 02/24/2022: The small midfoot opening on the right calcaneus is a little bit smaller today. The left foot wound is narrower and shallower. He continues to accumulate periwound callus. No concern for infection. 03/01/2022: The patient came to clinic early because he showered and got his cast wet. Fortunately, there is no significant maceration to his foot but the callus softened and it looks like the wound on his left calcaneus may be a little bit wider. The wound on his right calcaneus is just a narrow slit. Continued accumulation of periwound callus bilaterally. 03/08/2022: The wound on his right calcaneus is very nearly closed, just a small pinpoint opening under a bit of eschar; the left wound has come in quite a bit, as well. It is  narrower and shorter than at our last visit. Still with accumulated callus and eschar bilaterally. 03/17/2022: The right calcaneal wound is healed. The left wound is smaller and the surface itself is very clean, but there is some blue-green staining on the periwound callus, concerning for Pseudomonas aeruginosa. 03/23/2022: The right calcaneal wound remains closed. The left wound continues to contract. No further blue-green staining. Small amount of callus and slough accumulation. 03/28/2022: He came in early today because he had gotten his cast wet. On inspection, the wound itself did not get wet or macerated, just  a little bit of the forefoot. The wound itself is basically unchanged. 04/07/2022: The right foot wound remains closed. The left wound is the smallest that I have seen it to date. It is narrower and shorter. It still continues to accumulate slough on the surface. 04/15/2022: There is a band of epithelium now dividing the small left plantar foot wound in 2. There is still some slough on the surface. 04/21/2022: The wound continues to narrow. Just a little bit of slough on the surface. He seems to be responding well to endoform. 04/28/2022: Continued slow contraction of the wound. There is a little slough on the surface and some periwound callus. We have been using endoform and total contact cast. 05/05/2022: The wound appears to have stalled. There is slough and some periwound eschar/callus. No concern for infection, however. 05/12/2022: Unfortunately, his right foot has reopened. It is located at the most posterior aspect of his surgical incision. The area was noted to have drainage coming from it when his padding was removed today. Underneath some callus and senescent skin, there is an opening. No purulent drainage or malodor. On the left foot, the wound is again unchanged. There is some light blue staining on the callus, but no malodor or purulent drainage. 10/13; right and left heel remanence  of extensive plantar foot wounds. These are better than I remember by quite a big margin however he is still left with wounds on the left plantar heel and the right plantar heel. Been using endoform bilaterally. A culture was done that showed apparently Pseudomonas but we are still waiting for the [facility] antibiotic to use gentamicin today. He is still very active by description I am not sure about the offloading of his noncasted right foot Objective Constitutional Sitting or standing Blood Pressure is within target range for patient.. Pulse regular and within target range for patient.Marland Kitchen Respirations regular, non-labored and within target range.. Temperature is normal and within the target range for the patient.Marland Kitchen Appears in no distress. Vitals Time Taken: 9:50 AM, Height: 69 in, Weight: 280 lbs, BMI: 41.3, Temperature: 98.1 F, Pulse: 78 bpm, Respiratory Rate: 18 breaths/min, Blood Pressure: 144/72 mmHg. General Notes: Wound exam; the patient has wounds on the left greater than right plantar heels. Neither one of them look infected although there is thick callus skin around both wound areas. I remove this on the left some debris on the wound surface. No debridement on the right. Fortunately there is no evidence of infection this does not probe to bone Integumentary (Hair, Skin) Wound #1 status is Open. Original cause of wound was Pressure Injury. The date acquired was: 10/07/2019. The wound has been in treatment 96 weeks. The wound is located on the Right Calcaneus. The wound measures 0.5cm length x 0.3cm width x 0.4cm depth; 0.118cm^2 area and 0.047cm^3 volume. There is Fat Layer (Subcutaneous Tissue) exposed. There is no tunneling or undermining noted. There is a none present amount of drainage noted. The wound margin is thickened. There is small (1-33%) pink granulation within the wound bed. There is a large (67-100%) amount of necrotic tissue within the wound bed including Eschar and Adherent  Slough. The periwound skin appearance had no abnormalities noted for moisture. The periwound skin appearance had no abnormalities noted for color. The periwound skin appearance exhibited: Callus. Periwound temperature was noted as No Abnormality. Wound #2 status is Open. Original cause of wound was Pressure Injury. The date acquired was: 10/07/2019. The wound has been in treatment 96 weeks. The wound  is located on the Left Calcaneus. The wound measures 3cm length x 0.5cm width x 0.3cm depth; 1.178cm^2 area and 0.353cm^3 volume. There is Fat Layer (Subcutaneous Tissue) exposed. There is no tunneling or undermining noted. There is a medium amount of serosanguineous drainage noted. The wound margin is thickened. There is small (1-33%) red granulation within the wound bed. There is a large (67-100%) amount of necrotic tissue within the wound bed including Eschar and Adherent Slough. The periwound skin appearance had no abnormalities noted for color. The periwound skin appearance exhibited: Scarring, BRAN, ALDRIDGE ([id number]) [id number] Page 11 of 13 Maceration. Periwound temperature was noted as No Abnormality. 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 There was a Excisional Skin/Subcutaneous Tissue Debridement with a total area of 1.5 sq cm performed by Ricard Dillon., MD. With the following instrument(s): Curette to remove Non-Viable tissue/material. Material removed includes Callus, Subcutaneous Tissue, and Slough after achieving pain control using Lidocaine 4% T opical Solution. No specimens were taken. A time out was conducted at 10:31, prior to the start of the procedure. A Minimum amount of bleeding was  controlled with Pressure. The procedure was tolerated well with a pain level of 0 throughout and a pain level of 0 following the procedure. Post Debridement Measurements: 3cm length x 0.5cm width x 0.3cm depth; 0.353cm^3 volume. Post debridement Stage noted as Category/Stage III. Character of Wound/Ulcer Post Debridement is improved. Post procedure Diagnosis Wound #2: Same as Pre-Procedure General Notes: Scribed for Dr. Dellia Nims by J.Scotton. Pre-procedure diagnosis of Wound #2 is a Pressure Ulcer located on the Left Calcaneus . There was a T Contact Cast Procedure by Deno Etienne MD. Post procedure Diagnosis Wound #2: Same as Pre-Procedure Plan Follow-up Appointments: Return Appointment in 1 week. - Dr [name] Room 3 Anesthetic: Wound #2 Left Calcaneus: (In clinic) Topical Lidocaine 4% applied to wound bed - In clinic Bathing/ Shower/ Hygiene: May shower with protection but do not get wound dressing(s) wet. - Cover with cast protector (can purchase cast protector at [facility] or [facility] ) Edema Control - Lymphedema / SCD / Other: Elevate legs to the level of the heart or above for 30 minutes daily and/or when sitting, a frequency of: - throughout the day Avoid standing for long periods of time. Moisturize legs daily. - right leg and foot every night. Off-Loading: T Contact Cast to Left Lower Extremity - TCC to Left LL otal Other: - keep pressure off of the bottom of your feet Additional Orders / Instructions: Follow Nutritious Diet Non Wound Condition: Other Non Wound Condition Orders/Instructions: - Right Heel-Use Opifoam and Border Foam and Kerlix/Medipore soft cloth surgical tape (4x10 in/yd) for Non wound on Right Heel. WOUND #1: - Calcaneus Wound Laterality: Right Cleanser: Normal Saline (Generic) Every Other Day/30 Days Discharge Instructions: Cleanse the wound with Normal Saline prior to applying a clean dressing using gauze sponges, not tissue or cotton  balls. Cleanser: Wound Cleanser (DME) (Generic) Every Other Day/30 Days Discharge Instructions: Cleanse the wound with wound cleanser prior to applying a clean dressing using gauze sponges, not tissue or cotton balls. Prim Dressing: Endoform 2x2 in (DME) (Generic) Every Other Day/30 Days ary Discharge Instructions: Moisten with saline Secondary Dressing: ALLEVYN Heel 4 1/2in x 5 1/2in / 10.5cm  x 13.5cm (DME) (Generic) Every Other Day/30 Days Discharge Instructions: Apply over primary dressing as directed. Secondary Dressing: Woven Gauze Sponge, Non-Sterile 4x4 in Every Other Day/30 Days Discharge Instructions: Apply over primary dressing as directed. Secured With: Child psychotherapist, Sterile 2x75 (in/in) (DME) (Generic) Every Other Day/30 Days Discharge Instructions: Secure with stretch gauze as directed. Secured With: 5M Medipore H Soft Cloth Surgical T ape, 4 x 10 (in/yd) (DME) (Generic) Every Other Day/30 Days Discharge Instructions: Secure with tape as directed. WOUND #2: - Calcaneus Wound Laterality: Left Cleanser: Normal Saline (Generic) 1 x Per Week/30 Days Discharge Instructions: Cleanse the wound with Normal Saline prior to applying a clean dressing using gauze sponges, not tissue or cotton balls. Cleanser: Wound Cleanser 1 x Per Week/30 Days Discharge Instructions: Cleanse the wound with wound cleanser prior to applying a clean dressing using gauze sponges, not tissue or cotton balls. Topical: Gentamicin 1 x Per Week/30 Days Discharge Instructions: As directed by physician Topical: TCC 1 x Per Week/30 Days Discharge Instructions: to left leg Prim Dressing: Endoform 2x2 in 1 x Per Week/30 Days ary Discharge Instructions: Moisten with saline Secondary Dressing: Optifoam Non-Adhesive Dressing, 4x4 in (Generic) 1 x Per Week/30 Days MURDOCK, WINNER ([id number]) [id number] Page 12 of 13 Discharge Instructions: Apply over primary dressing as  directed. Secondary Dressing: Woven Gauze Sponge, Non-Sterile 4x4 in 1 x Per Week/30 Days Discharge Instructions: Apply over primary dressing as directed. Secured With: 5M Medipore H Soft Cloth Surgical T ape, 4 x 10 (in/yd) (Generic) 1 x Per Week/30 Days Discharge Instructions: Secure with tape as directed. 1. Continued with endoform under the total contact cast 2. He does not yet have his [facility] antibiotic probably early next week. I use gentamicin #3 TCC replaced on the left side Electronic Signature(s) Signed: 05/20/2022 5:29:53 PM By: [name] Signed: 05/22/2022 7:19:32 PM By: Linton Ham MD Entered By: [name] on 05/20/2022 17:27:37 -------------------------------------------------------------------------------- Total Contact Cast Details Patient Name: Date of Service: [name], [name] 05/20/2022 9:30 A M Medical Record Number: [id number] Patient Account Number: [id number] Date of Birth/Sex: Treating RN: Jun 29, 1976 (46 y.o. M) Primary Care Provider: [name] Other Clinician: Referring Provider: Treating Provider/Extender: [name] in Treatment: 96 T Contact Cast Applied for Wound Assessment: otal Wound #2 Left Calcaneus Performed By: Physician Ricard Dillon., MD Post Procedure Diagnosis Same as Pre-procedure Electronic Signature(s) Signed: 05/22/2022 7:19:32 PM By: Linton Ham MD Entered By: Linton Ham on 05/20/2022 11:28:06 -------------------------------------------------------------------------------- SuperBill Details Patient Name: Date of Service: [name], Shonto. 05/20/2022 Medical Record Number: [id number] Patient Account Number: [id number] Date of Birth/Sex: Treating RN: Oct 25, 1975 (46 y.o. M) Primary Care Provider: [name] Other Clinician: Referring Provider: Treating Provider/Extender: [name] in Treatment: 96 Diagnosis Coding ICD-10 Codes Code  Description [phone] Non-pressure chronic ulcer of other part of right foot with fat layer exposed L97.522 Non-pressure chronic ulcer of other part of left foot with fat layer exposed Facility Procedures : ARAGORN, MENDEZ Code: [id number] Allison ([id number]) ICD Description: [place] - DEB SUBQ TISSUE 20 SQ CM/< [phone] Diagnosis Description L97.522 Non-pressure chronic ulcer of other part of left foot with fat layer exposed Modifier: [id number] Quantity: 1 [id number].pdf Page 13 of 13 Physician Procedures : CPT4 Code Description Modifier [id number] - WC PHYS SUBQ TISS 20 SQ CM ICD-10 Diagnosis Description L97.522 Non-pressure chronic ulcer of other part of left foot with fat layer exposed Quantity: 1 Electronic Signature(s) Signed: 05/22/2022  7:19:32 PM By: Linton Ham MD Entered By: Linton Ham on 05/20/2022 11:34:14

## 2022-05-23 NOTE — Progress Notes (Signed)
[name], [name] ([id number]) [id number] Page 1 of 9 Visit Report for 05/20/2022 Arrival Information Details Patient Name: Date of Service: [name], [name] 05/20/2022 9:30 A M Medical Record Number: [id number] Patient Account Number: [id number] Date of Birth/Sex: Treating RN: November 30, 1975 (46 y.o. [name] Primary Care Nocole Zammit: [name] Other Clinician: Referring Ruffin Lada: Treating Shekela Goodridge/Extender: [name] in Treatment: 96 Visit Information History Since Last Visit Added or deleted any medications: No Patient Arrived: Wheel Chair Any new allergies or adverse reactions: No Arrival Time: 09:50 Had a fall or experienced change in No Accompanied By: spouse activities of daily living that may affect Transfer Assistance: Manual risk of falls: Patient Identification Verified: Yes Signs or symptoms of abuse/neglect since last visito No Patient Requires Transmission-Based Precautions: No Hospitalized since last visit: No Patient Has Alerts: No Implantable device outside of the clinic excluding No cellular tissue based products placed in the center since last visit: Has Dressing in Place as Prescribed: Yes Pain Present Now: Yes Electronic Signature(s) Signed: 05/20/2022 5:24:15 PM By: [name] Entered By: [name] on 05/20/2022 10:11:43 -------------------------------------------------------------------------------- Encounter Discharge Information Details Patient Name: Date of Service: [name], [name] 05/20/2022 9:30 A M Medical Record Number: [id number] Patient Account Number: [id number] Date of Birth/Sex: Treating RN: 05/10/1976 (46 y.o. [name] Primary Care Lyden Redner: [name] Other Clinician: Referring Ahmira Boisselle: Treating Daily Doe/Extender: [name] in Treatment: 96 Encounter Discharge Information Items Post Procedure Vitals Discharge Condition:  Stable Temperature (F): 98.1 Ambulatory Status: Wheelchair Pulse (bpm): 78 Discharge Destination: Home Respiratory Rate (breaths/min): 18 Transportation: Private Auto Blood Pressure (mmHg): 144/72 Accompanied By: spouse Schedule Follow-up Appointment: Yes Clinical Summary of Care: Patient Declined Notes Completed care for this patient today. Electronic Signature(s) Signed: 05/20/2022 5:24:15 PM By: [name] Entered By: [name] on 05/20/2022 17:23:43 [name] ([id number]) [id number] Page 2 of 9 -------------------------------------------------------------------------------- Lower Extremity Assessment Details Patient Name: Date of Service: [name] Grammes [place] E. 05/20/2022 9:30 A M Medical Record Number: [id number] Patient Account Number: [id number] Date of Birth/Sex: Treating RN: 06-07-1976 (46 y.o. [name] Primary Care Rudene Poulsen: [name] Other Clinician: Referring Jasmon Graffam: Treating [name]/Extender: [name] in Treatment: 96 Edema Assessment Assessed: [name]: No] [name]: No] Edema: [Left: N] [Right: o] Calf Left: Right: Point of Measurement: 29 cm From Medial Instep 41.7 cm 43.7 cm Ankle Left: Right: Point of Measurement: 9 cm From Medial Instep 25.4 cm 24 cm Vascular Assessment Pulses: Dorsalis Pedis Palpable: [Left:Yes] [Right:Yes] Electronic Signature(s) Signed: 05/20/2022 5:24:15 PM By: [name] Entered By: [name] on 05/20/2022 10:12:49 -------------------------------------------------------------------------------- Multi Wound Chart Details Patient Name: Date of Service: [name], [name] 05/20/2022 9:30 A M Medical Record Number: [id number] Patient Account Number: [id number] Date of Birth/Sex: Treating RN: Feb 14, 1976 (46 y.o. M) Primary Care Trine Fread: [name] Other Clinician: Referring Aireona Torelli: Treating Anacristina Steffek/Extender: [name] in Treatment: 96 Vital Signs Height(in): 69 Pulse(bpm): 78 Weight(lbs): 280 Blood Pressure(mmHg): 144/72 Body Mass Index(BMI): 41.3 Temperature(F): 98.1 Respiratory Rate(breaths/min): 18 [1:Photos:] [N/A:N/A [id number] Page 3 of 9] Right Calcaneus Left Calcaneus N/A Wound Location: Pressure Injury Pressure Injury N/A Wounding Event: Pressure Ulcer Pressure Ulcer N/A Primary Etiology: Asthma, Angina, Hypertension Asthma, Angina, Hypertension N/A Comorbid History: 10/07/2019 10/07/2019 N/A Date Acquired: 96 96 N/A Weeks of Treatment: Open Open N/A Wound Status: No No N/A Wound Recurrence: 0.5x0.3x0.4 3x0.5x0.3 N/A Measurements L x W x D (cm) 0.118 1.178 N/A  A (cm) : rea 0.047 0.353 N/A Volume (cm) : 99.60% 95.70% N/A % Reduction in A rea: 99.90% 98.70% N/A % Reduction in Volume: Category/Stage III Category/Stage III N/A Classification: None Present Medium N/A Exudate A mount: N/A Serosanguineous N/A Exudate Type: N/A red, brown N/A Exudate Color: Thickened Thickened N/A Wound Margin: Small (1-33%) Small (1-33%) N/A Granulation A mount: Pink Red N/A Granulation Quality: Large (67-100%) Large (67-100%) N/A Necrotic A mount: Eschar, Adherent Slough Eschar, Adherent Slough N/A Necrotic Tissue: Fat Layer (Subcutaneous Tissue): Yes Fat Layer (Subcutaneous Tissue): Yes N/A Exposed Structures: Fascia: No Fascia: No Tendon: No Tendon: No Muscle: No Muscle: No Joint: No Joint: No Bone: No Bone: No Small (1-33%) Small (1-33%) N/A Epithelialization: N/A Debridement - Excisional N/A Debridement: Pre-procedure Verification/Time Out N/A 10:31 N/A Taken: N/A Lidocaine 4% Topical Solution N/A Pain Control: N/A Callus, Subcutaneous, Slough N/A Tissue Debrided: N/A Skin/Subcutaneous Tissue N/A Level: N/A 1.5 N/A Debridement A (sq cm): rea N/A Curette N/A Instrument: N/A Minimum N/A Bleeding: N/A Pressure  N/A Hemostasis A chieved: N/A 0 N/A Procedural Pain: N/A 0 N/A Post Procedural Pain: N/A Procedure was tolerated well N/A Debridement Treatment Response: N/A 3x0.5x0.3 N/A Post Debridement Measurements L x W x D (cm) N/A 0.353 N/A Post Debridement Volume: (cm) N/A Category/Stage III N/A Post Debridement Stage: Callus: Yes Scarring: Yes N/A Periwound Skin Texture: No Abnormalities Noted Maceration: Yes N/A Periwound Skin Moisture: No Abnormalities Noted No Abnormalities Noted N/A Periwound Skin Color: No Abnormality No Abnormality N/A Temperature: N/A Debridement N/A Procedures Performed: T Contact Cast otal Treatment Notes Electronic Signature(s) Signed: 05/22/2022 7:19:32 PM By: Linton Ham MD Entered By: Linton Ham on 05/20/2022 11:27:46 -------------------------------------------------------------------------------- Multi-Disciplinary Care Plan Details Patient Name: Date of Service: [name], [name] 05/20/2022 9:30 A M Medical Record Number: [id number] Patient Account Number: [id number] Date of Birth/Sex: Treating RN: 1976/04/30 (46 y.o. [name] Primary Care Wandalee Klang: [name] Other Clinician: Referring Clois Montavon: Treating Tyneisha Hegeman/Extender: [name] in Treatment: [address] Multidisciplinary Care Plan reviewed with physician [address] [place], [place] ([id number]) [id number] Page 4 of 9 Wound/Skin Impairment Nursing Diagnoses: Impaired tissue integrity Knowledge deficit related to ulceration/compromised skin integrity Goals: Patient/caregiver will verbalize understanding of skin care regimen Date Initiated: 07/15/2020 Target Resolution Date: 07/07/2022 Goal Status: Active Ulcer/skin breakdown will have a volume reduction of 30% by week 4 Date Initiated: 07/15/2020 Date Inactivated: 08/20/2020 Target Resolution Date: 09/03/2020 Goal Status: Unmet Unmet Reason: no major  changes. Ulcer/skin breakdown will heal within 14 weeks Date Initiated: 12/04/2020 Date Inactivated: 12/10/2020 Target Resolution Date: 12/10/2020 Unmet Reason: wounds still open at 14 Goal Status: Unmet weeks and today 21 weeks. Interventions: Assess patient/caregiver ability to obtain necessary supplies Assess patient/caregiver ability to perform ulcer/skin care regimen upon admission and as needed Assess ulceration(s) every visit Provide education on ulcer and skin care Treatment Activities: Skin care regimen initiated : 07/15/2020 Topical wound management initiated : 07/15/2020 Notes: Electronic Signature(s) Signed: 05/20/2022 5:24:15 PM By: [name] Entered By: [name] on 05/20/2022 10:43:11 -------------------------------------------------------------------------------- Pain Assessment Details Patient Name: Date of Service: [name], [place]. 05/20/2022 9:30 A M Medical Record Number: [id number] Patient Account Number: [id number] Date of Birth/Sex: Treating RN: 04-Mar-1976 (46 y.o. [name] Primary Care Chantale Leugers: [name] Other Clinician: Referring Holman Bonsignore: Treating Terrel Nesheiwat/Extender: [name] in Treatment: 96 Active Problems Location of Pain Severity and Description of Pain Patient Has Paino Yes Site Locations Pain Location: Generalized Pain With Dressing Change: No  Duration of the Pain. Constant / Intermittento Constant Rate the pain. Current Pain Level: 5 Worst Pain Level: 10 Least Pain Level: 3 Tolerable Pain Level: 5 CALLAN, REATEGUI ([id number]) [phone].pdf Page 5 of 9 Character of Pain Describe the Pain: Difficult to Pinpoint Pain Management and Medication Current Pain Management: Medication: Yes Cold Application: No Rest: Yes Massage: No Activity: No T.MckenzieN.S.: No Heat Application: No Leg drop or elevation: No Is the Current Pain Management Adequate: Adequate How does your  wound impact your activities of daily livingo Sleep: No Bathing: No Appetite: No Relationship With Others: No Bladder Continence: No Emotions: No Bowel Continence: No Work: No Toileting: No Drive: No Dressing: No Hobbies: No Electronic Signature(s) Signed: 05/20/2022 5:24:15 PM By: [name] Entered By: [name] on 05/20/2022 10:12:42 -------------------------------------------------------------------------------- Patient/Caregiver Education Details Patient Name: Date of Service: [name] 10/13/2023andnbsp9:30 A M Medical Record Number: [id number] Patient Account Number: [id number] Date of Birth/Gender: Treating RN: Mar 06, 1976 (46 y.o. [name] Primary Care Physician: [name] Other Clinician: Referring Physician: Treating Physician/Extender: [name] in Treatment: 77 Education Assessment Education Provided To: Patient Education Topics Provided Wound/Skin Impairment: Methods: Explain/Verbal Responses: Return demonstration correctly Electronic Signature(s) Signed: 05/20/2022 5:24:15 PM By: [name] Entered By: [name] on 05/20/2022 17:22:04 -------------------------------------------------------------------------------- Wound Assessment Details Patient Name: Date of Service: [name], [name] 05/20/2022 9:30 A M Medical Record Number: [id number] Patient Account Number: [id number] Date of Birth/Sex: Treating RN: 1976-06-27 (46 y.o. [name] Primary Care Etana Beets: [name] Other Clinician: Referring Margaretann Abate: Treating Jeromie Gainor/Extender: [name] in Treatment: [address] ([id number]) [id number] Page 6 of 9 Wound Status Wound Number: 1 Primary Etiology: Pressure Ulcer Wound Location: Right Calcaneus Wound Status: Open Wounding Event: Pressure Injury Comorbid History: Asthma, Angina, Hypertension Date Acquired:  10/07/2019 Weeks Of Treatment: 96 Clustered Wound: No Photos Wound Measurements Length: (cm) 0.5 Width: (cm) 0.3 Depth: (cm) 0.4 Area: (cm) 0.118 Volume: (cm) 0.047 % Reduction in Area: 99.6% % Reduction in Volume: 99.9% Epithelialization: Small (1-33%) Tunneling: No Undermining: No Wound Description Classification: Category/Stage III Wound Margin: Thickened Exudate Amount: None Present Foul Odor After Cleansing: No Slough/Fibrino Yes Wound Bed Granulation Amount: Small (1-33%) Exposed Structure Granulation Quality: Pink Fascia Exposed: No Necrotic Amount: Large (67-100%) Fat Layer (Subcutaneous Tissue) Exposed: Yes Necrotic Quality: Eschar, Adherent Slough Tendon Exposed: No Muscle Exposed: No Joint Exposed: No Bone Exposed: No Periwound Skin Texture Texture Color No Abnormalities Noted: No No Abnormalities Noted: Yes Callus: Yes Temperature / Pain Temperature: No Abnormality Moisture No Abnormalities Noted: Yes Treatment Notes Wound #1 (Calcaneus) Wound Laterality: Right Cleanser Normal Saline Discharge Instruction: Cleanse the wound with Normal Saline prior to applying a clean dressing using gauze sponges, not tissue or cotton balls. Wound Cleanser Discharge Instruction: Cleanse the wound with wound cleanser prior to applying a clean dressing using gauze sponges, not tissue or cotton balls. Peri-Wound Care Topical Primary Dressing Endoform 2x2 in Discharge Instruction: Moisten with saline Secondary Dressing ALLEVYN Heel 4 1/2in x 5 1/2in / 10.5cm x 13.5cm Discharge Instruction: Apply over primary dressing as directed. Woven Gauze Sponge, Non-Sterile 4x4 in [place], HAGUE ([id number]) [id number] Page 7 of 9 Discharge Instruction: Apply over primary dressing as directed. Secured With Conforming Stretch Gauze Bandage, Sterile 2x75 (in/in) Discharge Instruction: Secure with stretch gauze as directed. 4M Medipore H Soft Cloth  Surgical T ape, 4 x 10 (in/yd) Discharge Instruction: Secure with tape as directed. Compression Wrap  Compression Stockings Add-Ons Electronic Signature(s) Signed: 05/20/2022 5:24:15 PM By: [name] Entered By: [name] on 05/20/2022 10:19:16 -------------------------------------------------------------------------------- Wound Assessment Details Patient Name: Date of Service: [name], [name] 05/20/2022 9:30 A M Medical Record Number: [id number] Patient Account Number: [id number] Date of Birth/Sex: Treating RN: 1975/10/29 (46 y.o. [name] Primary Care Tamakia Porto: [name] Other Clinician: Referring Jacqulyn Barresi: Treating Xayne Brumbaugh/Extender: [name] in Treatment: 96 Wound Status Wound Number: 2 Primary Etiology: Pressure Ulcer Wound Location: Left Calcaneus Wound Status: Open Wounding Event: Pressure Injury Comorbid History: Asthma, Angina, Hypertension Date Acquired: 10/07/2019 Weeks Of Treatment: 96 Clustered Wound: No Photos Wound Measurements Length: (cm) 3 Width: (cm) 0.5 Depth: (cm) 0.3 Area: (cm) 1.178 Volume: (cm) 0.353 % Reduction in Area: 95.7% % Reduction in Volume: 98.7% Epithelialization: Small (1-33%) Tunneling: No Undermining: No Wound Description Classification: Category/Stage III Wound Margin: Thickened Exudate Amount: Medium Exudate Type: Serosanguineous Exudate Color: red, brown Foul Odor After Cleansing: No Slough/Fibrino Yes Wound Bed Granulation Amount: Small (1-33%) Exposed Structure Granulation Quality: Red Fascia Exposed: No [name], [name] ([id number]) [phone].pdf Page 8 of 9 Necrotic Amount: Large (67-100%) Fat Layer (Subcutaneous Tissue) Exposed: Yes Necrotic Quality: Eschar, Adherent Slough Tendon Exposed: No Muscle Exposed: No Joint Exposed: No Bone Exposed: No Periwound Skin Texture Texture Color No Abnormalities Noted: No No Abnormalities Noted:  Yes Scarring: Yes Temperature / Pain Temperature: No Abnormality Moisture No Abnormalities Noted: No Maceration: Yes Treatment Notes Wound #2 (Calcaneus) Wound Laterality: Left Cleanser Normal Saline Discharge Instruction: Cleanse the wound with Normal Saline prior to applying a clean dressing using gauze sponges, not tissue or cotton balls. Wound Cleanser Discharge Instruction: Cleanse the wound with wound cleanser prior to applying a clean dressing using gauze sponges, not tissue or cotton balls. Peri-Wound Care Topical Gentamicin Discharge Instruction: As directed by physician TCC Discharge Instruction: to left leg Primary Dressing Endoform 2x2 in Discharge Instruction: Moisten with saline Secondary Dressing Optifoam Non-Adhesive Dressing, 4x4 in Discharge Instruction: Apply over primary dressing as directed. Woven Gauze Sponge, Non-Sterile 4x4 in Discharge Instruction: Apply over primary dressing as directed. Secured With 68M Medipore H Soft Cloth Surgical T ape, 4 x 10 (in/yd) Discharge Instruction: Secure with tape as directed. Compression Wrap Compression Stockings Add-Ons Electronic Signature(s) Signed: 05/20/2022 5:24:15 PM By: [name] Entered By: [name] on 05/20/2022 10:18:46 -------------------------------------------------------------------------------- Vitals Details Patient Name: Date of Service: [name], [name] 05/20/2022 9:30 A M Medical Record Number: [id number] Patient Account Number: [id number] Date of Birth/Sex: Treating RN: 04/21/1976 (46 y.o. [name] Primary Care Kenyen Candy: [name] Other Clinician: Referring Yandiel Bergum: Treating Linet Brash/Extender: [name] in Treatment: [address] [name], [name] ([id number]) [id number] Page 9 of 9 Time Taken: 09:50 Temperature (F): 98.1 Height (in): 69 Pulse (bpm): 78 Weight (lbs): 280 Respiratory Rate (breaths/min):  18 Body Mass Index (BMI): 41.3 Blood Pressure (mmHg): 144/72 Reference Range: 80 - 120 mg / dl Electronic Signature(s) Signed: 05/20/2022 5:24:15 PM By: [name] Entered By: [name] on 05/20/2022 10:12:07

## 2022-05-24 DIAGNOSIS — D225 Melanocytic nevi of trunk: Secondary | ICD-10-CM | POA: Diagnosis not present

## 2022-05-24 DIAGNOSIS — L02821 Furuncle of head [any part, except face]: Secondary | ICD-10-CM | POA: Diagnosis not present

## 2022-05-24 DIAGNOSIS — L821 Other seborrheic keratosis: Secondary | ICD-10-CM | POA: Diagnosis not present

## 2022-05-26 ENCOUNTER — Ambulatory Visit (HOSPITAL_BASED_OUTPATIENT_CLINIC_OR_DEPARTMENT_OTHER): Payer: PPO | Admitting: General Surgery

## 2022-05-27 ENCOUNTER — Encounter (HOSPITAL_BASED_OUTPATIENT_CLINIC_OR_DEPARTMENT_OTHER): Payer: PPO | Admitting: Internal Medicine

## 2022-05-27 DIAGNOSIS — L89623 Pressure ulcer of left heel, stage 3: Secondary | ICD-10-CM | POA: Diagnosis not present

## 2022-05-27 DIAGNOSIS — L89613 Pressure ulcer of right heel, stage 3: Secondary | ICD-10-CM | POA: Diagnosis not present

## 2022-05-27 DIAGNOSIS — L97522 Non-pressure chronic ulcer of other part of left foot with fat layer exposed: Secondary | ICD-10-CM | POA: Diagnosis not present

## 2022-05-27 NOTE — Progress Notes (Signed)
ONYEKACHI, SLEE ([id number]) [id number] Page 1 of 9 Visit Report for 05/27/2022 Arrival Information Details Patient Name: Date of Service: [name], [name] 05/27/2022 10:15 A M Medical Record Number: [id number] Patient Account Number: [id number] Date of Birth/Sex: Treating RN: 1976/03/25 (46 y.o. M) Primary Care [name]: [name] Other Clinician: Referring Shanette Tamargo: Treating Astryd Pearcy/Extender: [name] in Treatment: 59 Visit Information History Since Last Visit All ordered tests and consults were completed: No Patient Arrived: Wheel Chair Added or deleted any medications: No Arrival Time: 10:13 Any new allergies or adverse reactions: No Accompanied By: wife Had a fall or experienced change in No Transfer Assistance: None activities of daily living that may affect Patient Identification Verified: Yes risk of falls: Secondary Verification Process Completed: Yes Signs or symptoms of abuse/neglect since last visito No Patient Requires Transmission-Based Precautions: No Hospitalized since last visit: No Patient Has Alerts: No Implantable device outside of the clinic excluding No cellular tissue based products placed in the center since last visit: Pain Present Now: Yes Electronic Signature(s) Signed: 05/27/2022 11:34:44 AM By: Worthy Rancher Entered By: Worthy Rancher on 05/27/2022 10:13:59 -------------------------------------------------------------------------------- Encounter Discharge Information Details Patient Name: Date of Service: [name], [place]. 05/27/2022 10:15 A M Medical Record Number: [id number] Patient Account Number: [id number] Date of Birth/Sex: Treating RN: 03/15/1976 (46 y.o. [name] Primary Care [name]: [name] Other Clinician: Referring Royal Beirne: Treating Jillann Charette/Extender: [name] in Treatment: 50 Encounter Discharge Information Items Post Procedure  Vitals Discharge Condition: Stable Temperature (F): 99 Ambulatory Status: Wheelchair Pulse (bpm): 84 Discharge Destination: Home Respiratory Rate (breaths/min): 20 Transportation: Private Auto Blood Pressure (mmHg): 123/65 Accompanied By: spouse Schedule Follow-up Appointment: Yes Clinical Summary of Care: Patient Declined Electronic Signature(s) Signed: 05/27/2022 6:04:10 PM By: [name] Entered By: [name] on 05/27/2022 17:49:19 [name] ([id number]) [id number] Page 2 of 9 -------------------------------------------------------------------------------- Lower Extremity Assessment Details Patient Name: Date of Service: [name] [name] 05/27/2022 10:15 A M Medical Record Number: [id number] Patient Account Number: [id number] Date of Birth/Sex: Treating RN: Jun 10, 1976 (45 y.o. [name] Primary Care Kacen Mellinger: [name] Other Clinician: Referring Lorea Kupfer: Treating Lener Ventresca/Extender: [name] in Treatment: 97 Edema Assessment Assessed: [name]: No] [name]: No] Edema: [Left: N] [Right: o] Calf Left: Right: Point of Measurement: 29 cm From Medial Instep 41.7 cm 43.7 cm Ankle Left: Right: Point of Measurement: 9 cm From Medial Instep 25.4 cm 24 cm Vascular Assessment Pulses: Dorsalis Pedis Palpable: [Left:Yes Yes] [Right:Yes] Electronic Signature(s) Signed: 05/27/2022 6:04:10 PM By: [name] Entered By: [name] on 05/27/2022 11:03:36 -------------------------------------------------------------------------------- Multi Wound Chart Details Patient Name: Date of Service: [name], [name] 05/27/2022 10:15 A M Medical Record Number: [id number] Patient Account Number: [id number] Date of Birth/Sex: Treating RN: 12-11-1975 (46 y.o. M) Primary Care [name]: [name] Other Clinician: Referring Kenidee Cregan: Treating Daneshia Tavano/Extender: [name] in  Treatment: 97 Vital Signs Height(in): 69 Pulse(bpm): 84 Weight(lbs): 280 Blood Pressure(mmHg): 123/65 Body Mass Index(BMI): 41.3 Temperature(F): 99.0 Respiratory Rate(breaths/min): 20 [1:Photos:] [N/A:N/A [id number] Page 3 of 9] Right Calcaneus Left Calcaneus N/A Wound Location: Pressure Injury Pressure Injury N/A Wounding Event: Pressure Ulcer Pressure Ulcer N/A Primary Etiology: Asthma, Angina, Hypertension Asthma, Angina, Hypertension N/A Comorbid History: 10/07/2019 10/07/2019 N/A Date Acquired: 97 97 N/A Weeks of Treatment: Open Open N/A Wound Status: No No N/A Wound Recurrence: 1.2x0.5x0.3 1.5x2x0.3 N/A Measurements L x W x D (cm) 0.471 2.356 N/A A (cm) :  rea 0.141 0.707 N/A Volume (cm) : 98.50% 91.30% N/A % Reduction in A rea: 99.60% 97.40% N/A % Reduction in Volume: Category/Stage III Category/Stage III N/A Classification: None Present Medium N/A Exudate A mount: N/A Serosanguineous N/A Exudate Type: N/A red, brown N/A Exudate Color: Thickened Thickened N/A Wound Margin: Large (67-100%) Large (67-100%) N/A Granulation A mount: Pink Red N/A Granulation Quality: Small (1-33%) Small (1-33%) N/A Necrotic A mount: Eschar, Adherent Slough Eschar, Adherent Slough N/A Necrotic Tissue: Fat Layer (Subcutaneous Tissue): Yes Fat Layer (Subcutaneous Tissue): Yes N/A Exposed Structures: Fascia: No Fascia: No Tendon: No Tendon: No Muscle: No Muscle: No Joint: No Joint: No Bone: No Bone: No Small (1-33%) Small (1-33%) N/A Epithelialization: Debridement - Excisional Debridement - Excisional N/A Debridement: Pre-procedure Verification/Time Out 11:45 11:45 N/A Taken: Lidocaine 5% topical ointment Lidocaine 5% topical ointment N/A Pain Control: Subcutaneous, Slough Subcutaneous, Slough N/A Tissue Debrided: Skin/Subcutaneous Tissue Skin/Subcutaneous Tissue N/A Level: 0.6 3 N/A Debridement A (sq cm): rea Curette Curette  N/A Instrument: Minimum Minimum N/A Bleeding: Pressure Pressure N/A Hemostasis A chieved: 0 0 N/A Procedural Pain: 0 0 N/A Post Procedural Pain: Procedure was tolerated well Procedure was tolerated well N/A Debridement Treatment Response: 1.2x0.5x0.3 1.5x2.5x0.3 N/A Post Debridement Measurements L x W x D (cm) 0.141 0.884 N/A Post Debridement Volume: (cm) Category/Stage III Category/Stage III N/A Post Debridement Stage: Callus: Yes Scarring: Yes N/A Periwound Skin Texture: No Abnormalities Noted Maceration: Yes N/A Periwound Skin Moisture: No Abnormalities Noted No Abnormalities Noted N/A Periwound Skin Color: No Abnormality No Abnormality N/A Temperature: Debridement Debridement N/A Procedures Performed: T Contact Cast otal Treatment Notes Electronic Signature(s) Signed: 05/27/2022 4:47:23 PM By: Linton Ham MD Entered By: Linton Ham on 05/27/2022 12:24:29 -------------------------------------------------------------------------------- Multi-Disciplinary Care Plan Details Patient Name: Date of Service: [name], [place] 05/27/2022 10:15 A M Medical Record Number: [id number] Patient Account Number: [id number] Date of Birth/Sex: Treating RN: March 13, 1976 (46 y.o. [name] Primary Care Mickaela Starlin: [name] Other Clinician: Referring Rosalin Buster: Treating Welford Christmas/Extender: [name] in Treatment: 66 Multidisciplinary Care Plan reviewed with physician RYNE, CORNELSON ([id number]) [id number] Page 4 of 9 Active Inactive Wound/Skin Impairment Nursing Diagnoses: Impaired tissue integrity Knowledge deficit related to ulceration/compromised skin integrity Goals: Patient/caregiver will verbalize understanding of skin care regimen Date Initiated: 07/15/2020 Target Resolution Date: 03/08/2023 Goal Status: Active Ulcer/skin breakdown will have a volume reduction of 30% by week 4 Date Initiated:  07/15/2020 Date Inactivated: 08/20/2020 Target Resolution Date: 09/03/2020 Goal Status: Unmet Unmet Reason: no major changes. Ulcer/skin breakdown will heal within 14 weeks Date Initiated: 12/04/2020 Date Inactivated: 12/10/2020 Target Resolution Date: 12/10/2020 Unmet Reason: wounds still open at 14 Goal Status: Unmet weeks and today 21 weeks. Interventions: Assess patient/caregiver ability to obtain necessary supplies Assess patient/caregiver ability to perform ulcer/skin care regimen upon admission and as needed Assess ulceration(s) every visit Provide education on ulcer and skin care Treatment Activities: Skin care regimen initiated : 07/15/2020 Topical wound management initiated : 07/15/2020 Notes: Electronic Signature(s) Signed: 05/27/2022 6:04:10 PM By: [name] Entered By: [name] on 05/27/2022 17:47:09 -------------------------------------------------------------------------------- Pain Assessment Details Patient Name: Date of Service: [name], [place] 05/27/2022 10:15 A M Medical Record Number: [id number] Patient Account Number: [id number] Date of Birth/Sex: Treating RN: 04-21-1976 (46 y.o. M) Primary Care Decarlo Rivet: [name] Other Clinician: Referring Madesyn Ast: Treating Zaylin Pistilli/Extender: [name] in Treatment: 97 Active Problems Location of Pain Severity and Description of Pain Patient Has Paino Yes Site Locations Rate the pain.  Current Pain Level: 4 Worst Pain Level: 10 Least Pain Level: 0 Tolerable Pain Level: 6 [name], [name] ([id number]) [id number] Page 5 of 9 Character of Pain Describe the Pain: Sharp Pain Management and Medication Current Pain Management: Electronic Signature(s) Signed: 05/27/2022 11:34:44 AM By: Worthy Rancher Entered By: Worthy Rancher on 05/27/2022 10:14:59 -------------------------------------------------------------------------------- Patient/Caregiver Education  Details Patient Name: Date of Service: [name], [place] 10/20/2023andnbsp10:15 A M Medical Record Number: [id number] Patient Account Number: [id number] Date of Birth/Gender: Treating RN: Aug 21, 1975 (46 y.o. [name] Primary Care Physician: [name] Other Clinician: Referring Physician: Treating Physician/Extender: [name] in Treatment: 42 Education Assessment Education Provided To: Patient Education Topics Provided Wound/Skin Impairment: Methods: Explain/Verbal Responses: Return demonstration correctly Electronic Signature(s) Signed: 05/27/2022 6:04:10 PM By: [name] Entered By: [name] on 05/27/2022 17:47:25 -------------------------------------------------------------------------------- Wound Assessment Details Patient Name: Date of Service: [name], [place] 05/27/2022 10:15 A M Medical Record Number: [id number] Patient Account Number: [id number] Date of Birth/Sex: Treating RN: 1975/12/18 (46 y.o. M) Primary Care Aneesha Holloran: [name] Other Clinician: Referring Nicko Daher: Treating Jimeka Balan/Extender: [name] in Treatment: 97 Wound Status Wound Number: 1 Primary Etiology: Pressure Ulcer Wound Location: Right Calcaneus Wound Status: Open Wounding Event: Pressure Injury Comorbid History: Asthma, Angina, Hypertension Date Acquired: 10/07/2019 Weeks Of Treatment: 97 Clustered Wound: No Photos OMRAN, KEELIN ([id number]) [id number] Page 6 of 9 Wound Measurements Length: (cm) 1.2 Width: (cm) 0.5 Depth: (cm) 0.3 Area: (cm) 0.471 Volume: (cm) 0.141 % Reduction in Area: 98.5% % Reduction in Volume: 99.6% Epithelialization: Small (1-33%) Tunneling: No Undermining: No Wound Description Classification: Category/Stage III Wound Margin: Thickened Exudate Amount: None Present Foul Odor After Cleansing: No Slough/Fibrino Yes Wound Bed Granulation Amount: Large  (67-100%) Exposed Structure Granulation Quality: Pink Fascia Exposed: No Necrotic Amount: Small (1-33%) Fat Layer (Subcutaneous Tissue) Exposed: Yes Necrotic Quality: Eschar, Adherent Slough Tendon Exposed: No Muscle Exposed: No Joint Exposed: No Bone Exposed: No Periwound Skin Texture Texture Color No Abnormalities Noted: No No Abnormalities Noted: Yes Callus: Yes Temperature / Pain Temperature: No Abnormality Moisture No Abnormalities Noted: Yes Treatment Notes Wound #1 (Calcaneus) Wound Laterality: Right Cleanser Normal Saline Discharge Instruction: Cleanse the wound with Normal Saline prior to applying a clean dressing using gauze sponges, not tissue or cotton balls. Wound Cleanser Discharge Instruction: Cleanse the wound with wound cleanser prior to applying a clean dressing using gauze sponges, not tissue or cotton balls. Peri-Wound Care Topical Primary Dressing Endoform 2x2 in Discharge Instruction: Moisten with saline Secondary Dressing ALLEVYN Heel 4 1/2in x 5 1/2in / 10.5cm x 13.5cm Discharge Instruction: Apply over primary dressing as directed. Woven Gauze Sponge, Non-Sterile 4x4 in Discharge Instruction: Apply over primary dressing as directed. Secured With Conforming Stretch Gauze Bandage, Sterile 2x75 (in/in) Discharge Instruction: Secure with stretch gauze as directed. 43M Medipore H Soft Cloth Surgical T ape, 4 x 10 (in/yd) Discharge Instruction: Secure with tape as directed. Compression [name], [name] ([id number]) [id number] Page 7 of 9 Compression Stockings Add-Ons Electronic Signature(s) Signed: 05/27/2022 6:04:10 PM By: [name] Entered By: [name] on 05/27/2022 11:08:19 -------------------------------------------------------------------------------- Wound Assessment Details Patient Name: Date of Service: [name], [place] 05/27/2022 10:15 A M Medical Record Number: [id number] Patient Account  Number: [id number] Date of Birth/Sex: Treating RN: 1976-06-08 (46 y.o. M) Primary Care Marvelle Caudill: [name] Other Clinician: Referring Erina Hamme: Treating Nelvin Tomb/Extender: [name] in Treatment: 32 Wound Status Wound Number: 2 Primary Etiology: Pressure  Ulcer Wound Location: Left Calcaneus Wound Status: Open Wounding Event: Pressure Injury Comorbid History: Asthma, Angina, Hypertension Date Acquired: 10/07/2019 Weeks Of Treatment: 97 Clustered Wound: No Photos Wound Measurements Length: (cm) 1.5 Width: (cm) 2 Depth: (cm) 0.3 Area: (cm) 2.356 Volume: (cm) 0.707 % Reduction in Area: 91.3% % Reduction in Volume: 97.4% Epithelialization: Small (1-33%) Tunneling: No Undermining: No Wound Description Classification: Category/Stage III Wound Margin: Thickened Exudate Amount: Medium Exudate Type: Serosanguineous Exudate Color: red, brown Foul Odor After Cleansing: No Slough/Fibrino Yes Wound Bed Granulation Amount: Large (67-100%) Exposed Structure Granulation Quality: Red Fascia Exposed: No Necrotic Amount: Small (1-33%) Fat Layer (Subcutaneous Tissue) Exposed: Yes Necrotic Quality: Eschar, Adherent Slough Tendon Exposed: No Muscle Exposed: No Joint Exposed: No Bone Exposed: No Periwound Skin Texture Texture Color No Abnormalities Noted: No No Abnormalities Noted: Yes Scarring: Yes Temperature / Pain [name], [name] ([id number]) [id number] Page 8 of 9 Scarring: Yes Temperature / Pain Temperature: No Abnormality Moisture No Abnormalities Noted: No Maceration: Yes Treatment Notes Wound #2 (Calcaneus) Wound Laterality: Left Cleanser Normal Saline Discharge Instruction: Cleanse the wound with Normal Saline prior to applying a clean dressing using gauze sponges, not tissue or cotton balls. Wound Cleanser Discharge Instruction: Cleanse the wound with wound cleanser prior to applying a clean dressing using gauze  sponges, not tissue or cotton balls. Peri-Wound Care Topical Gentamicin Discharge Instruction: As directed by physician TCC Discharge Instruction: to left leg Primary Dressing Endoform 2x2 in Discharge Instruction: Moisten with saline Secondary Dressing Optifoam Non-Adhesive Dressing, 4x4 in Discharge Instruction: Apply over primary dressing as directed. Woven Gauze Sponge, Non-Sterile 4x4 in Discharge Instruction: Apply over primary dressing as directed. Secured With 48M Medipore H Soft Cloth Surgical T ape, 4 x 10 (in/yd) Discharge Instruction: Secure with tape as directed. Compression Wrap Compression Stockings Add-Ons Electronic Signature(s) Signed: 05/27/2022 6:04:10 PM By: [name] Entered By: [name] on 05/27/2022 11:08:44 -------------------------------------------------------------------------------- Vitals Details Patient Name: Date of Service: [name], [place] 05/27/2022 10:15 A M Medical Record Number: [id number] Patient Account Number: [id number] Date of Birth/Sex: Treating RN: Jan 24, 1976 (46 y.o. M) Primary Care Clara Herbison: [name] Other Clinician: Referring Marlayna Bannister: Treating Elizzie Westergard/Extender: [name] in Treatment: 97 Vital Signs Time Taken: 10:15 Temperature (F): 99.0 Height (in): 69 Pulse (bpm): 84 Weight (lbs): 280 Respiratory Rate (breaths/min): 20 Body Mass Index (BMI): 41.3 Blood Pressure (mmHg): 123/65 Reference Range: 80 - 120 mg / dl Electronic Signature(s) Signed: 05/27/2022 11:34:44 AM By: Clement Sayres, TONY10/20/2023 11:34:44 AM By: Worthy Rancher SignedJohnette Abraham ([id number]) [id number] Page 9 of 9 Entered By: Worthy Rancher on 05/27/2022 10:14:29

## 2022-05-27 NOTE — Progress Notes (Signed)
[name], [name] ([name]) [id number] Page 1 of 14 Visit Report for 05/27/2022 Debridement Details Patient Name: Date of Service: [name], [facility] 05/27/2022 10:15 A M Medical Record Number: [name] Patient Account Number: [id number] Date of Birth/Sex: Treating RN: Mar 29, 1976 (46 y.o. [name] Primary Care Provider: Cristie [name] Other Clinician: Referring Provider: Treating Provider/Extender: [name] in Treatment: 97 Debridement Performed for Assessment: Wound #1 Right Calcaneus Performed By: Physician [name], MD Debridement Type: Debridement Level of Consciousness (Pre-procedure): Awake and Alert Pre-procedure Verification/Time Out Yes - 11:45 Taken: Start Time: 11:45 Pain Control: Lidocaine 5% topical ointment T Area Debrided (L x W): otal 1.2 (cm) x 0.5 (cm) = 0.6 (cm) Tissue and other material debrided: Non-Viable, Callus, Slough, Subcutaneous, Slough Level: Skin/Subcutaneous Tissue Debridement Description: Excisional Instrument: Curette Bleeding: Minimum Hemostasis Achieved: Pressure End Time: 11:47 Procedural Pain: 0 Post Procedural Pain: 0 Response [name] Treatment: Procedure was tolerated well Level of Consciousness (Post- Awake and Alert procedure): Post Debridement Measurements of Total Wound Length: (cm) 1.2 Stage: Category/Stage III Width: (cm) 0.5 Depth: (cm) 0.3 Volume: (cm) 0.141 Character of Wound/Ulcer Post Debridement: Improved Post Procedure Diagnosis Same as Pre-procedure Notes Scribed for Dr [name] by [name] Electronic Signature(s) Signed: 05/27/2022 4:47:23 PM By: [name] Ham MD Signed: 05/27/2022 6:04:10 PM By: [name] Catholic RN Entered By: [name] on 05/27/2022 13:51:01 -------------------------------------------------------------------------------- Debridement Details Patient Name: Date of Service: [name], [place] 05/27/2022 10:15 A M Medical  Record Number: [name] Patient Account Number: [id number] Date of Birth/Sex: Treating RN: 1975/10/21 (46 y.o. [name] Primary Care Provider: Cristie [name] Other Clinician: Referring Provider: Treating Provider/Extender: [name] in Treatment: 97 Debridement Performed for Assessment: Wound #2 Left Calcaneus [name], [name] ([name]) [id number] Page 2 of 14 Performed By: Physician [name], MD Debridement Type: Debridement Level of Consciousness (Pre-procedure): Awake and Alert Pre-procedure Verification/Time Out Yes - 11:45 Taken: Start Time: 11:45 Pain Control: Lidocaine 5% topical ointment T Area Debrided (L x W): otal 1.5 (cm) x 2 (cm) = 3 (cm) Tissue and other material debrided: Non-Viable, Callus, Slough, Subcutaneous, Slough Level: Skin/Subcutaneous Tissue Debridement Description: Excisional Instrument: Curette Bleeding: Minimum Hemostasis Achieved: Pressure End Time: 11:47 Procedural Pain: 0 Post Procedural Pain: 0 Response [name] Treatment: Procedure was tolerated well Level of Consciousness (Post- Awake and Alert procedure): Post Debridement Measurements of Total Wound Length: (cm) 1.5 Stage: Category/Stage III Width: (cm) 2.5 Depth: (cm) 0.3 Volume: (cm) 0.884 Character of Wound/Ulcer Post Debridement: Improved Post Procedure Diagnosis Same as Pre-procedure Notes Sxribed for Dr [name] by [name] Electronic Signature(s) Signed: 05/27/2022 4:47:23 PM By: [name] Ham MD Signed: 05/27/2022 6:04:10 PM By: [name] Catholic RN Entered By: [name] on 05/27/2022 13:51:11 -------------------------------------------------------------------------------- HPI Details Patient Name: Date of Service: [name], [place] 05/27/2022 10:15 A M Medical Record Number: [name] Patient Account Number: [id number] Date of Birth/Sex: Treating RN: 05-08-1976 (46 y.o. M) Primary Care Provider:  Cristie [name] Other Clinician: Referring Provider: Treating Provider/Extender: [name] in Treatment: 11 History of Present Illness HPI Description: Wounds are12/03/2020 upon evaluation today patient presents for initial inspection here in our clinic concerning issues he has been having with the bottoms of his feet bilaterally. He states these actually occurred as wounds when he was hospitalized for 5 months secondary [name] Covid. He was apparently with tilting bed where he was in an upright position quite frequently and apparently this occurred in some way shape or form during  that time. Fortunately there is no sign of active infection at this time. No fevers, chills, nausea, vomiting, or diarrhea. With that being said he still has substantial wounds on the plantar aspects of his feet [name] require quite a bit of work [name] get these [name] heal. He has been using Santyl currently though that is been problematic both in receiving the medication as well as actually paid for it as it is become quite expensive. Prior [name] the experience with Covid the patient really did not have any major medical problems other than hypertension he does have some mild generalized weakness following the Covid experience. 07/22/2020 on evaluation today patient appears [name] be doing okay in regard [name] his foot ulcers I feel like the wound beds are showing signs of better improvement that I do believe the Iodoflex is helping in this regard. With that being said he does have a lot of drainage currently and this is somewhat blue/green in nature which is consistent with Pseudomonas. I do think a culture today would be appropriate for [place] [name] evaluate and see if that is indeed the case I would likely start him on antibiotic orally as well he is not allergic [name] Cipro knows of no issues he has had in the past 12/21; patient was admitted [name] the clinic earlier this month with bilateral presumed pressure ulcers on the  bottom of his feet apparently related [name] excessive pressure from a tilt table arrangement in the intensive care unit. Patient relates this [name] being on ECMO but I am not really sure that is exactly related [name] that. I must say I have never seen anything like this. He has fairly extensive full-thickness wounds extending from his heel towards his midfoot mostly centered laterally. There is already been some healing distally. He does not appear [name] have an arterial issue. He has been using gentamicin [name] the wound surfaces with Iodoflex [name] help with ongoing debridement 1/6; this is a patient with pressure ulcers on the bottom of his feet related [name] excessive pressure from a standing position in the intensive care unit. He is [name], [name] ([id number]) [id number] Page 3 of 14 complaining of a lot of pain in the right heel. He is not a diabetic. 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 We have been using Iodoflex [name] help prepare the surfaces of both wounds for an advanced treatment product. He is nonambulatory spending most of his time in a wheelchair I have asked him not [name] propel the wheelchair with his heels 1/13; in general his wounds look better not much surface area change we have been using Iodoflex as of last week. I did an x-ray of the right heel as the patient was complaining of pain. I had some thoughts about a stress fracture perhaps Achilles tendon problems however what it showed was erosive changes along the inferior aspect of the calcaneus he now has a MRI booked for 1/20. 1/20; in general his wounds continue [name] be better. Some improvement in the large narrow areas proximally in his foot. He is still complaining of pain in the right heel and tenderness in certain areas of this wound. His MRI is tonight. I am not just looking for osteomyelitis that was brought up on the x-ray I am wondering about stress fractures, tendon ruptures etc. He has  no such findings on the left. Also noteworthy is that the patient had critical illness neuropathy and some of the discomfort may be actual improvement in nerve  function I am just not sure. These wounds were initially in the setting of severe critical illness related [name] COVID-19. He was put in a standing position. He may have also been on pressors at the point contributing [name] tissue ischemia. By his description at some point these wounds were grossly necrotic extending proximally up into the Achilles part of his heel. I do not know that I have ever really seen pictures of them like this although they may exist in epic We have ordered Tri layer Oasis. I am trying [name] stimulate some granulation in these areas. This is of course assuming the MRI is negative for infection 1/27; since the patient was last here he saw Dr. Juleen China of infectious disease. He is planned for vancomycin and ceftriaxone. Prior operative culture grew MSSA. Also ordered baseline lab work. He also ordered arterial studies although the ABIs in our clinic were normal as well as his clinical exam these were normal I do not think he needs [name] see vascular surgery. His ABIs at the PTA were 1.22 in the right triphasic waveforms with a normal TBI of 1.15 on the left ABI of 1.22 with triphasic waveforms and a normal TBI of 1.08. Finally he saw Dr. Amalia Hailey who will follow him in for 2 months. At this point I do not think he felt that he needed a procedure on the right calcaneal bone. Dr. Juleen China is elected for broad-spectrum antibiotic The patient is still having pain in the right heel. He walks with a walker 2/3; wounds are generally smaller. He is tolerating his IV antibiotics. I believe this is vancomycin and ceftriaxone. We are still waiting for Oasis burn in terms of his out-of-pocket max which he should be meeting soon given the IV antibiotics, MRIs etc. I have asked him [name] check in on this. We are using silver collagen in the meantime the  wounds look better 2/10; tolerating IV vancomycin and Rocephin. We are waiting [name] apply for Oasis. Although I am not really sure where he is in his out-of-pocket max. 2/17 started the first application of Oasis trilayer. Still on antibiotics. The wounds have generally look better. The area on the left has a little more surface slough requiring debridement [id number]; second application of Oasis trilayer. The wound surface granulation is generally look better. The area on the left with undermining laterally I think is come in a bit. 10/08/2020 upon evaluation today patient is here today for [employer] application #3. Fortunately he seems [name] be doing extremely well with regard [name] this and we are seeing a lot of new epithelial growth which is great news. Fortunately there is no signs of active infection at this time. 10/16/2020 upon evaluation today patient appears [name] be doing well with regard [name] his foot ulcers. Do believe the Oasis has been of benefit for him. I do not see any signs of infection right now which is great news and I think that he has a lot of new epithelial growth which is great [name] see as well. The patient is very pleased [name] hear all of this. I do think we can proceed with the Oasis trilayer #4 today. 3/18; not as much improvement in these areas on his heels that I was hoping. I did reapply trilateral Oasis today the tissue looks healthier but not as much fill in as I was hoping. 3/25; better looking today I think this is come in a bit the tissue looks healthier. Triple layer Oasis reapplied #6 4/1; somewhat better  looking definitely better looking surface not as much change in surface area as I was hoping. He may be spending more time [place] on days then he needs [name] although he does have heel offloading boots. Triple layer Oasis reapplied #7 4/7; unfortunately apparently [facility] will not approve any further Oasis which is unfortunate since the patient did respond nicely both  in terms of the condition of the wound bed as well as surface area. There is still some drainage coming from the wound but not a lot there does not appear [name] be any infection 4/15; we have been using Hydrofera Blue. He continues [name] have nice rims of epithelialization on the right greater than the left. The left the epithelialization is coming from the tip of his heel. There is moderate drainage. In this that concerns me about a total contact cast. There is no evidence of infection 4/29; patient has been using Hydrofera Blue with dressing changes. He has no complaints or issues today. 5/5; using Hydrofera Blue. I actually think that he looks marginally better than the last time I saw this 3 weeks ago. There are rims of epithelialization on the left thumb coming from the medial side on the right. Using Hydrofera Blue 5/12; using Hydrofera Blue. These continue [name] make improvements in surface area. His drainage was not listed as severe I therefore went ahead and put a cast on the left foot. Right foot we will continue [name] dress his previous 5/16; back for first total contact cast change. He did not tolerate this particularly well cast injury on the anterior tibia among other issues. Difficulty sleeping. I talked him about this in some detail and afterwards is elected [name] continue. I told him I would like [name] have a cast on for 3 weeks [name] see if this is going [name] help at all. I think he agreed 5/19; I think the wound is better. There is no tunneling towards his midfoot. The undermining medially also looks better. He has a rim of new skin distally. I think we are making progress here. The area on the left also continues [name] look somewhat better [name] me using Hydrofera Blue. He has a list of complaints about the cast but none of them seem serious 5/26; patient presents for 1 week follow-up. He has been using a total contact cast and tolerating this well. Hydrofera Blue is the main dressing used. He denies signs  of infection. 6/2 Hydrofera Blue total contact cast on the left. These were large ulcers that formed in intensive care unit where the patient was recovering from [place]. May have had something to do with being ventilated in an upright positiono Pressors etc. We have been able [name] get the areas down considerably and a viable surface. There is some epithelialization in both sides. Note made of drainage 6/9; changed [name] [facility] last time because of drainage. He arrives with better looking surfaces and dimensions on the left than the right. Paradoxically the right actually probes more towards his midfoot the left is largely close down but both of these look improved. Using a total contact cast on the left 6/16; complex wounds on his bilateral plantar heels which were initially pressure injury from a stay in the ICU with COVID. We have been using silver alginate most recently. His dimensions of come in quite dramatically however not recently. We have been putting the left foot in a total contact cast 6/23; complex wounds on the bilateral plantar heels. I been putting  the left in the cast paradoxically the area on the right is the one that is going towards closure at a faster rate. Quite a bit of drainage on the left. The patient went [name] see Dr. Amalia Hailey who said he was going [name] standby for skin grafts. I had actually considered sending him for skin grafts however he would be mandatorily off his feet for a period of weeks [name] months. I am thinking that the area on the right is going [name] close on its own the area on the left has been more stubborn even though we have him in a total contact cast 6/30; took him out of a total contact cast last week is the right heel seem [name] be making better progress than the left where I was placing the cast. We are using silver alginate. Both wounds are smaller right greater than left 7/12; both wounds look as though they are making some progress. We are using silver alginate.  Heel offloading boots 7/26; very gradual progress especially on the right. Using silver alginate. He is wearing heel offloading boots 8/18; he continues [name] close these wounds down very gradually. Using silver alginate. The problem polymen being definitive about this is areas of what appears [name] be callus around the margins. This is not a 100% of the area but certainly sizable especially on the right 9/1; bilateral plantar feet wounds secondary [name] prolonged pressure while being ventilated for COVID-19 in an upright position. Essentially pressure ulcers on the bottom of his feet. He is made substantial progress using silver alginate. 9/14; bilateral plantar feet wounds secondary [name] prolonged pressure. Making progress using silver alginate. 9/29 bilateral plantar feet wounds secondary [name] prolonged pressure. I changed him [name] Iodoflex last week. MolecuLight showing reddened blush fluorescence [name], [name] ([name]) [id number] Page 4 of 14 10/11; patient presents for follow-up. He has no issues or complaints today. He denies signs of infection. He continues [name] use Iodoflex and antibiotic ointment [name] the wound beds. 10/27; 2-week follow-up. No evidence of infection. He has callus and thick dry skin around the wound margins we have been using Iodoflex and Bactroban which was in response [name] a moderate left MolecuLight reddish blush fluorescence. 11/10; 2-week follow-up. Wound margins again have thick callus however the measurements of the actual wound sites are a lot smaller. Everything looks reasonably healthy here. We have been using Iodoflex He was approved for prime matrix but I have elected [name] delay this given the improvement in the surface area. Hopefully I will not regret that decision as were getting close [name] the end of the year in terms of insurance payment 12/8; 2-week follow-up. Wounds are generally smaller in size. These were initially substantial wounds extending  into the forefoot all the way into the heel on the bilateral plantar feet. They are now both located on the plantar heel distal aspect both of these have a lot of callus around the wounds I used a #5 curette [name] remove this on the right and the left also some subcutaneous debris [name] try and get the wound edges were using Iodoflex. He has heel offloading shoe 12/22; 2-week follow-up. Not really much improvement. He has thick callus around the outer edges of both wounds. I remove this there is some nonviable subcutaneous tissue as well. We have been using Iodoflex. Her intake nurse and myself spontaneously thought of a total contact cast I went back in May. At that time we really were not seeing much of an improvement with  a cast although the wound was in a much different situation I would like [name] retry this in 2 weeks and I discussed this with the patient 08/12/2021; the patient has had some improvement with the Iodoflex. The the area on the left heel plantar more improved than the right. I had [name] put him in a total contact cast on the left although I decided [name] put that off for 2 weeks. I am going [name] change his primary dressing [name] silver collagen. I think in both areas he has had some improvement most of the healing seems [name] be more proximal in the heel. The wounds are in the mid aspect. A lot of thick callus on the right heel however. 1/19; we are using silver collagen on both plantar heel areas. He has had some improvement today. The left did not require any debridement. He still had some eschar on the right that was debrided but both seem [name] have contracted. I did not put it total contact cast on him today 2/2 we have been using silver collagen. The area on the right plantar heel has areas that appear [name] be epithelialized interspersed with dry flaking callus and dry skin. I removed this. This really looks better than on the other side. On the left still a large area with raised edges and debris on the  surface. 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 He has no issues or complaints today. 2/9; not much change in the left foot wound with 1 week of a cast we are using silver collagen. Silver collagen on the right side. The right side has been the better wound surface. We will reapply the total contact cast on the left 2/16; not much improvement on either side I been using silver collagen with a total contact cast on the left. I'm changing the Hydrofera Blue still with a total contact cast on the left 2/23; some improvement on both sides. Disappointing that he has thick callus around the area that we are putting in a total contact cast on the left. We've been using Hydrofera Blue on both wound areas. This is a man who at essentially pressure ulcers in addition [name] ischemia caused by medications [name] support his blood pressure (pressors) in the ICU. He was being ventilated in the standing position for severe Covid. A [place] the wounds extended across his entire foot but are now localized [name] his plantar heels bilaterally. We have made progress however neither areas healed. I continue [name] think the total contact cast is helped albeit painstakingly slowly. He has never wanted a plastic surgery consult although I don't know that they would be interested in grafting in area in this location. 10/07/2021: Continued improvement bilaterally. There is still some callus around the left wound, despite the total contact cast. He has some increased pain in his right midfoot around 1 particular area. This has been painful in the past but seems [name] be a little bit worse. When his cast was removed today, there was an area on the heel of the left foot that looks a bit macerated. He is also complaining of pain in his left thigh and hip which he thinks is secondary [name] the limb length discrepancy  caused by the cast. 10/14/2021: He continues [name] improve. A little bit less callus around the left wound. He continues [name] endorse pain in his  right midfoot, but this is not as significant as it was last week. The maceration on his left heel is improved. 10/21/2021: Continued improvement [name] both wounds. The maceration on his left heel is no longer evident. Less callus bilaterally. Epithelialization progressing. 10/28/2021: Significant improvement this week. The right sided wound is nearly closed with just a small open area at the middle. No maceration seen on the left heel. Continued epithelialization on both sides. No concern for infection. 11/04/2021: T oday, the wounds were measured a little bit differently and come out as larger, but I actually think they are about the same [name] potentially even smaller, particularly on the left. He continues [name] accumulate some callus on the right. 11/11/2021: T oday, the patient is expressing some concern that the left wound, despite being in the total contact cast, is not progressing at the same rate as the 1 on the right. He is interested in trying a week without the cast [name] see how the wound does. The wounds are roughly the same size as last week, with the right perhaps being a little bit smaller. He continues [name] build up callus on both sites. 11/18/2021: Last week, I permitted the patient [name] go without his total contact cast, just [name] see if the cast was really making any difference. Today, both wounds have deteriorated [name] some extent, suggesting that the cast is providing benefit, at least on the left. Both are larger and have accumulated callus, slough, and other debris. 11/26/2021: I debrided both wounds quite aggressively last week in an effort [name] stimulate the healing cascade. This appears [name] have been effective as the left sided wound is a full centimeter shorter in length. Although the right was measured slightly larger, on inspection, it looks as though an area of  epithelialized tissue was included in the measurements. We have been using PolyMem Ag on the wound surfaces with a total contact cast [name] the left. 12/02/2021: It appears that the intake personnel are including epithelialized tissue in his wound measurements; the right wound is almost completely epithelialized; there is just a crater at the proximal midfoot with some open areas. On the left, he has built up some callus, but the overall wound surface looks good. There is some senescent skin around the wound margin. He has been in PolyMem Ag bilaterally with a total contact cast on the left. 12/09/2021: The right wound is nearly closed; there is just a small open area at the mid calcaneus. On the left, the wound is smaller with minimal callus buildup. No significant drainage. 12/16/2021: The right calcaneal wound remains minimally open at the mid calcaneus; the rest has epithelialized. On the left, the wound is also a little bit smaller. There is some senescent tissue on the periphery. He is getting his first application of a trial skin substitute called Vendaje today. 12/23/2021: The wound on his right calcaneus is nearly closed; there is just a small area at the most distal aspect of the calcaneus that is open. On the left, the area where we applied [name] the skin substitute has a healthier-looking bed of granulation tissue. The wound dimensions are not significantly different on this side but the wound surface is improved. 12/30/2021: The wound on the right calcaneus has not changed significantly aside from some accumulation of callus. On the left, the open area is smaller and continues [name] have an improved surface. He continues [name] accumulate callus around the wound. He is here for his third application of Vendaje. 01/06/2022: The  right calcaneal wound is down [name] just a couple of millimeters. He continues [name] accumulate periwound callus. He unfortunately got his cast wet earlier in the week and his left foot is  macerated, resulting in some superficial skin loss just distal [name] the open wound. The open wound itself, however, is much smaller and has a healthier appearing surface. He is here for his fourth application of Vendaje. [name], [name] ([name]) [id number] Page 5 of 14 01/13/2022: The right calcaneal wound is about the same. Unfortunately, once again, his cast got wet and his foot is again macerated. This is caused the left calcaneal wound [name] enlarge. He is here for his fifth application of Vendaje. 01/20/2022: The right calcaneal wound is very small. There is some periwound callus accumulation. He purchased a new cast protector last week and this has been effective in avoiding the maceration that has been occurring on the left. The left calcaneal wound is narrower and has a healthy and viable-appearing surface. He is here for his 6 application of Vendaje. 01/27/2022: The right calcaneal wound is down [name] just a pinhole. There is some periwound slough and callus. On the left, the wound is narrower and shorter by about a centimeter. The surface is robust and viable-appearing. Unfortunately, the rep for the trial skin substitute product did not provide any for [place] [name] use today. 02/04/2022: The right calcaneal wound remains unchanged. There is more accumulated callus. On the left, although the intake nurse measured it a little bit longer, it looks about the same [name] me. The surface has a layer of slough, but underneath this, there is good granulation tissue. 02/10/2022: The right calcaneus wound is nearly closed. There is still some callus that builds up around the site. The left side looks about the same in terms of dimensions, but the surface is more robust and vital-appearing. 02/16/2022: The area of the right calcaneus that was nearly closed last week has closed, but there is a small opening at the mid foot where it looks like some moisture got retained and caused some reopening.  The left foot wound is narrower and shallower. Both sites have a fair amount of periwound callus and eschar. 02/24/2022: The small midfoot opening on the right calcaneus is a little bit smaller today. The left foot wound is narrower and shallower. He continues [name] accumulate periwound callus. No concern for infection. 03/01/2022: The patient came [name] clinic early because he showered and got his cast wet. Fortunately, there is no significant maceration [name] his foot but the callus softened and it looks like the wound on his left calcaneus may be a little bit wider. The wound on his right calcaneus is just a narrow slit. Continued accumulation of periwound callus bilaterally. 03/08/2022: The wound on his right calcaneus is very nearly closed, just a small pinpoint opening under a bit of eschar; the left wound has come in quite a bit, as well. It is narrower and shorter than at our last visit. Still with accumulated callus and eschar bilaterally. 03/17/2022: The right calcaneal wound is healed. The left wound is smaller and the surface itself is very clean, but there is some blue-green staining on the periwound callus, concerning for Pseudomonas aeruginosa. 03/23/2022: The right calcaneal wound remains closed. The left wound continues [name] contract. No further blue-green staining. Small amount of callus and slough accumulation. 03/28/2022: He came in early today because he had gotten his cast wet. On inspection, the wound itself did not get wet or macerated, just  a little bit of the forefoot. The wound itself is basically unchanged. 04/07/2022: The right foot wound remains closed. The left wound is the smallest that I have seen it [name] date. It is narrower and shorter. It still continues [name] accumulate slough on the surface. 04/15/2022: There is a band of epithelium now dividing the small left plantar foot wound in 2. There is still some slough on the surface. 04/21/2022: The wound continues [name] narrow. Just a little bit  of slough on the surface. He seems [name] be responding well [name] endoform. 04/28/2022: Continued slow contraction of the wound. There is a little slough on the surface and some periwound callus. We have been using endoform and total contact cast. 05/05/2022: The wound appears [name] have stalled. There is slough and some periwound eschar/callus. No concern for infection, however. 05/12/2022: Unfortunately, his right foot has reopened. It is located at the most posterior aspect of his surgical incision. The area was noted [name] have drainage coming from it when his padding was removed today. Underneath some callus and senescent skin, there is an opening. No purulent drainage or malodor. On the left foot, the wound is again unchanged. There is some light blue staining on the callus, but no malodor or purulent drainage. 10/13; right and left heel remanence of extensive plantar foot wounds. These are better than I remember by quite a big margin however he is still left with wounds on the left plantar heel and the right plantar heel. Been using endoform bilaterally. A culture was done that showed apparently Pseudomonas but we are still waiting for the [facility] (1-Rh) antibiotic [name] use gentamicin today. He is still very active by description I am not sure about the offloading of his noncasted right foot 10/20; both wounds right and left heel debrided not much change from last week. [employer] has arrived which is linezolid, gentamicin and ciprofloxacin we will use this with endoform. T contact cast on the left otal Electronic Signature(s) Signed: 05/27/2022 4:47:23 PM By: [name] Ham MD Entered By: [name] on 05/27/2022 12:26:05 -------------------------------------------------------------------------------- Physical Exam Details Patient Name: Date of Service: [name], [place] 05/27/2022 10:15 A M Medical Record Number: [name] Patient Account Number: [id number] Date of Birth/Sex: Treating RN: 20-Feb-1976 (46  y.o. M) Primary Care Provider: Cristie [name] Other Clinician: Referring Provider: Treating Provider/Extender: [name] in Treatment: 97 Constitutional Sitting or standing Blood Pressure is within target range for patient.. Pulse regular and within target range for patient.Marland Kitchen Respirations regular, non-labored and [name], [name] ([name]) [id number] Page 6 of 14 within target range.. Temperature is normal and within the target range for the patient.Marland Kitchen Appears in no distress. Notes Wound exam; not much change from last week. Still thick raised edges around both wounds. The area on the left has a better surface superiorly. I used a #5 curette [name] try [name] clean up the edges and debris on the surface. There is no evidence of infection this does not probe [name] bone Electronic Signature(s) Signed: 05/27/2022 4:47:23 PM By: [name] Ham MD Entered By: [name] on 05/27/2022 12:26:50 -------------------------------------------------------------------------------- Physician Orders Details Patient Name: Date of Service: [name], [place] 05/27/2022 10:15 A M Medical Record Number: [name] Patient Account Number: [id number] Date of Birth/Sex: Treating RN: 1976-02-08 (46 y.o. [name] Primary Care Provider: Cristie [name] Other Clinician: Referring Provider: Treating Provider/Extender: [name] in Treatment: 71 Verbal / Phone Orders: No Diagnosis Coding Follow-up Appointments ppointment in 1 week. -  Dr Lady Gary Room 3 06/02/22 at 0930 Return A Anesthetic Wound #2 Left Calcaneus (In clinic) Topical Lidocaine 4% applied [name] wound bed - In clinic Bathing/ Shower/ Hygiene May shower with protection but do not get wound dressing(s) wet. - Cover with cast protector (can purchase cast protector at [facility] or [facility] ) Edema Control - Lymphedema / SCD / Other Bilateral Lower Extremities Elevate legs [name] the  level of the heart or above for 30 minutes daily and/or when sitting, a frequency of: - throughout the day Avoid standing for long periods of time. Moisturize legs daily. - right leg and foot every night. Off-Loading Total Contact Cast [name] Left Lower Extremity - TCC [name] Left LL Other: - keep pressure off of the bottom of your feet Additional Orders / Instructions Follow Nutritious Diet Non Wound Condition Right Lower Extremity Other Non Wound Condition Orders/Instructions: - Right Heel-Use Opifoam and Border Foam and Kerlix/Medipore soft cloth surgical tape (4x10 in/yd) for Non wound on Right Heel. Wound Treatment Wound #1 - Calcaneus Wound Laterality: Right Cleanser: Normal Saline (Generic) Every Other Day/30 Days Discharge Instructions: Cleanse the wound with Normal Saline prior [name] applying a clean dressing using gauze sponges, not tissue or cotton balls. Cleanser: Wound Cleanser (Generic) Every Other Day/30 Days Discharge Instructions: Cleanse the wound with wound cleanser prior [name] applying a clean dressing using gauze sponges, not tissue or cotton balls. Prim Dressing: Endoform 2x2 in (Generic) Every Other Day/30 Days ary Discharge Instructions: Moisten with saline Secondary Dressing: ALLEVYN Heel 4 1/2in x 5 1/2in / 10.5cm x 13.5cm (Generic) Every Other Day/30 Days Discharge Instructions: Apply over primary dressing as directed. Secondary Dressing: Woven Gauze Sponge, Non-Sterile 4x4 in Every Other Day/30 Days Discharge Instructions: Apply over primary dressing as directed. Secured With: Insurance underwriter, Sterile 2x75 (in/in) (Generic) Every Other Day/30 Days [name], [name] ([id number]) [id number] Page 7 of 14 Discharge Instructions: Secure with stretch gauze as directed. Secured With: 41M Medipore H Soft Cloth Surgical T ape, 4 x 10 (in/yd) (Generic) Every Other Day/30 Days Discharge Instructions: Secure with tape as directed. Wound #2 -  Calcaneus Wound Laterality: Left Cleanser: Normal Saline (Generic) 1 x Per Week/30 Days Discharge Instructions: Cleanse the wound with Normal Saline prior [name] applying a clean dressing using gauze sponges, not tissue or cotton balls. Cleanser: Wound Cleanser 1 x Per Week/30 Days Discharge Instructions: Cleanse the wound with wound cleanser prior [name] applying a clean dressing using gauze sponges, not tissue or cotton balls. Topical: Gentamicin 1 x Per Week/30 Days Discharge Instructions: As directed by physician Topical: TCC 1 x Per Week/30 Days Discharge Instructions: [name] left leg Prim Dressing: Endoform 2x2 in 1 x Per Week/30 Days ary Discharge Instructions: Moisten with saline Secondary Dressing: Optifoam Non-Adhesive Dressing, 4x4 in (Generic) 1 x Per Week/30 Days Discharge Instructions: Apply over primary dressing as directed. Secondary Dressing: Woven Gauze Sponge, Non-Sterile 4x4 in 1 x Per Week/30 Days Discharge Instructions: Apply over primary dressing as directed. Secured With: 41M Medipore H Soft Cloth Surgical T ape, 4 x 10 (in/yd) (Generic) 1 x Per Week/30 Days Discharge Instructions: Secure with tape as directed. Electronic Signature(s) Signed: 05/27/2022 4:47:23 PM By: Baltazar Najjar MD Signed: 05/27/2022 6:04:10 PM By: [name] RN Entered By: [name] on 05/27/2022 12:23:36 -------------------------------------------------------------------------------- Problem List Details Patient Name: Date of Service: [name], [name] [name] 05/27/2022 10:15 A M Medical Record Number: [id number] Patient Account Number: [id number] Date of Birth/Sex: Treating RN: [birth year] (46 y.o. M) Primary Care Provider:  Cristie [name] Other Clinician: Referring Provider: Treating Provider/Extender: [name] in Treatment: 97 Active Problems ICD-10 Encounter Code Description Active Date MDM Diagnosis L97.512 Non-pressure chronic ulcer of other part of right foot with  fat layer exposed 09/03/2020 No Yes L97.522 Non-pressure chronic ulcer of other part of left foot with fat layer exposed 09/03/2020 No Yes Inactive Problems ICD-10 Code Description Active Date Inactive Date L89.893 Pressure ulcer of other site, stage 3 07/15/2020 07/15/2020 Andree [name] ([name]) [phone].pdf Page 8 of 14 M62.81 Muscle weakness (generalized) 07/15/2020 07/15/2020 I10 Essential (primary) hypertension 07/15/2020 07/15/2020 M86.171 Other acute osteomyelitis, right ankle and foot 09/03/2020 09/03/2020 Resolved Problems Electronic Signature(s) Signed: 05/27/2022 4:47:23 PM By: [name] Ham MD Entered By: [name] on 05/27/2022 12:24:21 -------------------------------------------------------------------------------- Progress Note Details Patient Name: Date of Service: [name], [place] 05/27/2022 10:15 A M Medical Record Number: [name] Patient Account Number: [id number] Date of Birth/Sex: Treating RN: Apr 02, 1976 (46 y.o. M) Primary Care Provider: Cristie [name] Other Clinician: Referring Provider: Treating Provider/Extender: [name] in Treatment: 97 Subjective History of Present Illness (HPI) Wounds are12/03/2020 upon evaluation today patient presents for initial inspection here in our clinic concerning issues he has been having with the bottoms of his feet bilaterally. He states these actually occurred as wounds when he was hospitalized for 5 months secondary [name] Covid. He was apparently with tilting bed where he was in an upright position quite frequently and apparently this occurred in some way shape or form during that time. Fortunately there is no sign of active infection at this time. No fevers, chills, nausea, vomiting, or diarrhea. With that being said he still has substantial wounds on the plantar aspects of his feet [name] require quite a bit of work [name] get these [name] heal. He has been using Santyl currently  though that is been problematic both in receiving the medication as well as actually paid for it as it is become quite expensive. Prior [name] the experience with Covid the patient really did not have any major medical problems other than hypertension he does have some mild generalized weakness following the Covid experience. 07/22/2020 on evaluation today patient appears [name] be doing okay in regard [name] his foot ulcers I feel like the wound beds are showing signs of better improvement that I do believe the Iodoflex is helping in this regard. With that being said he does have a lot of drainage currently and this is somewhat blue/green in nature which is consistent with Pseudomonas. I do think a culture today would be appropriate for [place] [name] evaluate and see if that is indeed the case I would likely start him on antibiotic orally as well he is not allergic [name] Cipro knows of no issues he has had in the past 12/21; patient was admitted [name] the clinic earlier this month with bilateral presumed pressure ulcers on the bottom of his feet apparently related [name] excessive pressure from a tilt table arrangement in the intensive care unit. Patient relates this [name] being on ECMO but I am not really sure that is exactly related [name] that. I must say I have never seen anything like this. He has fairly extensive full-thickness wounds extending from his heel towards his midfoot mostly centered laterally. There is already been some healing distally. He does not appear [name] have an arterial issue. He has been using gentamicin [name] the wound surfaces with Iodoflex [name] help with ongoing debridement 1/6; this is a patient with pressure ulcers on  the bottom of his feet related [name] excessive pressure from a standing position in the intensive care unit. He is complaining of a lot of pain in the right heel. He is not a diabetic. 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 We have been using Iodoflex [name] help prepare the  surfaces of both wounds for an advanced treatment product. He is nonambulatory spending most of his time in a wheelchair I have asked him not [name] propel the wheelchair with his heels 1/13; in general his wounds look better not much surface area change we have been using Iodoflex as of last week. I did an x-ray of the right heel as the patient was complaining of pain. I had some thoughts about a stress fracture perhaps Achilles tendon problems however what it showed was erosive changes along the inferior aspect of the calcaneus he now has a MRI booked for 1/20. 1/20; in general his wounds continue [name] be better. Some improvement in the large narrow areas proximally in his foot. He is still complaining of pain in the right heel and tenderness in certain areas of this wound. His MRI is tonight. I am not just looking for osteomyelitis that was brought up on the x-ray I am wondering about stress fractures, tendon ruptures etc. He has no such findings on the left. Also noteworthy is that the patient had critical illness neuropathy and some of the discomfort may be actual improvement in nerve function I am just not sure. These wounds were initially in the setting of severe critical illness related [name] COVID-19. He was put in a standing position. He may have also been on pressors at the point contributing [name] tissue ischemia. By his description at some point these wounds were grossly necrotic extending proximally up into the Achilles part of his heel. I do not know that I have ever really seen pictures of them like this although they may exist in epic We have ordered Tri layer Oasis. I am trying [name] stimulate some granulation in these areas. This is of course assuming the MRI is negative for infection 1/27; since the patient was last here he saw Dr. Juleen China of infectious disease. He is planned for vancomycin and ceftriaxone. Prior operative culture grew MSSA. Also ordered baseline lab work. He also ordered  arterial studies although the ABIs in our clinic were normal as well as his clinical exam these were normal I do not think he needs [name] see vascular surgery. His ABIs at the [facility] were 1.22 in the right triphasic waveforms with a normal TBI of 1.15 on the left ABI of 1.22 with triphasic waveforms and a normal TBI of 1.08. Finally he saw Dr. Amalia Hailey who will follow him in for 2 months. At this point I do not think he felt that he needed a procedure on the right calcaneal bone. Dr. Jonnie Finner, [place] ([name]) [id number] Page 9 of 20 Juleen China is elected for broad-spectrum antibiotic The patient is still having pain in the right heel. He walks with a walker 2/3; wounds are generally smaller. He is tolerating his IV antibiotics. I believe this is vancomycin and ceftriaxone. We are still waiting for Oasis burn in terms of his out-of-pocket max which he should be meeting soon given the IV antibiotics, MRIs etc. I have asked him [name] check in on this. We are using silver collagen in the meantime the wounds look better 2/10; tolerating IV vancomycin and Rocephin. We are waiting [name] apply for Oasis. Although I  am not really sure where he is in his out-of-pocket max. 2/17 started the first application of Oasis trilayer. Still on antibiotics. The wounds have generally look better. The area on the left has a little more surface slough requiring debridement [id number]; second application of Oasis trilayer. The wound surface granulation is generally look better. The area on the left with undermining laterally I think is come in a bit. 10/08/2020 upon evaluation today patient is here today for [employer] application #3. Fortunately he seems [name] be doing extremely well with regard [name] this and we are seeing a lot of new epithelial growth which is great news. Fortunately there is no signs of active infection at this time. 10/16/2020 upon evaluation today patient appears [name] be doing well with regard [name]  his foot ulcers. Do believe the Oasis has been of benefit for him. I do not see any signs of infection right now which is great news and I think that he has a lot of new epithelial growth which is great [name] see as well. The patient is very pleased [name] hear all of this. I do think we can proceed with the Oasis trilayer #4 today. 3/18; not as much improvement in these areas on his heels that I was hoping. I did reapply trilateral Oasis today the tissue looks healthier but not as much fill in as I was hoping. 3/25; better looking today I think this is come in a bit the tissue looks healthier. Triple layer Oasis reapplied #6 4/1; somewhat better looking definitely better looking surface not as much change in surface area as I was hoping. He may be spending more time [place] on days then he needs [name] although he does have heel offloading boots. Triple layer Oasis reapplied #7 4/7; unfortunately apparently [facility] will not approve any further Oasis which is unfortunate since the patient did respond nicely both in terms of the condition of the wound bed as well as surface area. There is still some drainage coming from the wound but not a lot there does not appear [name] be any infection 4/15; we have been using Hydrofera Blue. He continues [name] have nice rims of epithelialization on the right greater than the left. The left the epithelialization is coming from the tip of his heel. There is moderate drainage. In this that concerns me about a total contact cast. There is no evidence of infection 4/29; patient has been using Hydrofera Blue with dressing changes. He has no complaints or issues today. 5/5; using Hydrofera Blue. I actually think that he looks marginally better than the last time I saw this 3 weeks ago. There are rims of epithelialization on the left thumb coming from the medial side on the right. Using Hydrofera Blue 5/12; using Hydrofera Blue. These continue [name] make improvements in surface  area. His drainage was not listed as severe I therefore went ahead and put a cast on the left foot. Right foot we will continue [name] dress his previous 5/16; back for first total contact cast change. He did not tolerate this particularly well cast injury on the anterior tibia among other issues. Difficulty sleeping. I talked him about this in some detail and afterwards is elected [name] continue. I told him I would like [name] have a cast on for 3 weeks [name] see if this is going [name] help at all. I think he agreed 5/19; I think the wound is better. There is no tunneling towards his midfoot. The undermining medially also  looks better. He has a rim of new skin distally. I think we are making progress here. The area on the left also continues [name] look somewhat better [name] me using Hydrofera Blue. He has a list of complaints about the cast but none of them seem serious 5/26; patient presents for 1 week follow-up. He has been using a total contact cast and tolerating this well. Hydrofera Blue is the main dressing used. He denies signs of infection. 6/2 Hydrofera Blue total contact cast on the left. These were large ulcers that formed in intensive care unit where the patient was recovering from [place]. May have had something to do with being ventilated in an upright positiono Pressors etc. We have been able [name] get the areas down considerably and a viable surface. There is some epithelialization in both sides. Note made of drainage 6/9; changed [name] [name] last time because of drainage. He arrives with better looking surfaces and dimensions on the left than the right. Paradoxically the right actually probes more towards his midfoot the left is largely close down but both of these look improved. Using a total contact cast on the left 6/16; complex wounds on his bilateral plantar heels which were initially pressure injury from a stay in the ICU with COVID. We have been using silver alginate most recently. His dimensions  of come in quite dramatically however not recently. We have been putting the left foot in a total contact cast 6/23; complex wounds on the bilateral plantar heels. I been putting the left in the cast paradoxically the area on the right is the one that is going towards closure at a faster rate. Quite a bit of drainage on the left. The patient went [name] see Dr. Amalia Hailey who said he was going [name] standby for skin grafts. I had actually considered sending him for skin grafts however he would be mandatorily off his feet for a period of weeks [name] months. I am thinking that the area on the right is going [name] close on its own the area on the left has been more stubborn even though we have him in a total contact cast 6/30; took him out of a total contact cast last week is the right heel seem [name] be making better progress than the left where I was placing the cast. We are using silver alginate. Both wounds are smaller right greater than left 7/12; both wounds look as though they are making some progress. We are using silver alginate. Heel offloading boots 7/26; very gradual progress especially on the right. Using silver alginate. He is wearing heel offloading boots 8/18; he continues [name] close these wounds down very gradually. Using silver alginate. The problem polymen being definitive about this is areas of what appears [name] be callus around the margins. This is not a 100% of the area but certainly sizable especially on the right 9/1; bilateral plantar feet wounds secondary [name] prolonged pressure while being ventilated for COVID-19 in an upright position. Essentially pressure ulcers on the bottom of his feet. He is made substantial progress using silver alginate. 9/14; bilateral plantar feet wounds secondary [name] prolonged pressure. Making progress using silver alginate. 9/29 bilateral plantar feet wounds secondary [name] prolonged pressure. I changed him [name] Iodoflex last week. MolecuLight showing reddened blush  fluorescence 10/11; patient presents for follow-up. He has no issues or complaints today. He denies signs of infection. He continues [name] use Iodoflex and antibiotic ointment [name] the wound beds. 10/27; 2-week follow-up. No evidence of infection.  He has callus and thick dry skin around the wound margins we have been using Iodoflex and Bactroban which was in response [name] a moderate left MolecuLight reddish blush fluorescence. 11/10; 2-week follow-up. Wound margins again have thick callus however the measurements of the actual wound sites are a lot smaller. Everything looks reasonably healthy here. We have been using Iodoflex He was approved for prime matrix but I have elected [name] delay this given the improvement in the surface area. Hopefully I will not regret that decision as were getting close [name] the end of the year in terms of insurance payment 12/8; 2-week follow-up. Wounds are generally smaller in size. These were initially substantial wounds extending into the forefoot all the way into the heel on the bilateral plantar feet. They are now both located on the plantar heel distal aspect both of these have a lot of callus around the wounds I used a #5 curette [name] remove this on the right and the left also some subcutaneous debris [name] try and get the wound edges were using Iodoflex. He has heel offloading shoe 12/22; 2-week follow-up. Not really much improvement. He has thick callus around the outer edges of both wounds. I remove this there is some nonviable subcutaneous tissue as well. We have been using Iodoflex. Her intake nurse and myself spontaneously thought of a total contact cast I went back in May. At that time we really were not seeing much of an improvement with a cast although the wound was in a much different situation I would like [name] retry this in 2 weeks and I discussed this with the patient 08/12/2021; the patient has had some improvement with the Iodoflex. The the area on the left heel plantar  more improved than the right. I had [name] put him in a total contact cast on the left although I decided [name] put that off for 2 weeks. I am going [name] change his primary dressing [name] silver collagen. I think in both areas he has had some improvement most of the healing seems [name] be more proximal in the heel. The wounds are in the mid aspect. A lot of thick callus on the right heel however. 1/19; we are using silver collagen on both plantar heel areas. He has had some improvement today. The left did not require any debridement. He still had some eschar on the right that was debrided but both seem [name] have contracted. I did not put it total contact cast on him today 2/2 we have been using silver collagen. The area on the right plantar heel has areas that appear [name] be epithelialized interspersed with dry flaking callus and dry skin. I removed this. This really looks better than on the other side. On the left still a large area with raised edges and debris on the surface. [name], [name] ([id number]) [id number] Page 10 of 14 The patient states he is in the heel offloading boots for a prolonged period of time and really does not use any other footwear 2/6; patient presents for first cast exchange. He has no issues or complaints today. 2/9; not much change in the left foot wound with 1 week of a cast we are using silver collagen. Silver collagen on the right side. The right side has been the better wound surface. We will reapply the total contact cast on the left 2/16; not much improvement on either side I been using silver collagen with a total contact cast on the left. I'm changing the [facility]  still with a total contact cast on the left 2/23; some improvement on both sides. Disappointing that he has thick callus around the area that we are putting in a total contact cast on the left. We've been using Hydrofera Blue on both wound areas. This is a man who at essentially pressure  ulcers in addition [name] ischemia caused by medications [name] support his blood pressure (pressors) in the ICU. He was being ventilated in the standing position for severe Covid. A Shiley the wounds extended across his entire foot but are now localized [name] his plantar heels bilaterally. We have made progress however neither areas healed. I continue [name] think the total contact cast is helped albeit painstakingly slowly. He has never wanted a plastic surgery consult although I don't know that they would be interested in grafting in area in this location. 10/07/2021: Continued improvement bilaterally. There is still some callus around the left wound, despite the total contact cast. He has some increased pain in his right midfoot around 1 particular area. This has been painful in the past but seems [name] be a little bit worse. When his cast was removed today, there was an area on the heel of the left foot that looks a bit macerated. He is also complaining of pain in his left thigh and hip which he thinks is secondary [name] the limb length discrepancy caused by the cast. 10/14/2021: He continues [name] improve. A little bit less callus around the left wound. He continues [name] endorse pain in his right midfoot, but this is not as significant as it was last week. The maceration on his left heel is improved. 10/21/2021: Continued improvement [name] both wounds. The maceration on his left heel is no longer evident. Less callus bilaterally. Epithelialization progressing. 10/28/2021: Significant improvement this week. The right sided wound is nearly closed with just a small open area at the middle. No maceration seen on the left heel. Continued epithelialization on both sides. No concern for infection. 11/04/2021: T oday, the wounds were measured a little bit differently and come out as larger, but I actually think they are about the same [name] potentially even smaller, particularly on the left. He continues [name] accumulate some callus on the  right. 11/11/2021: T oday, the patient is expressing some concern that the left wound, despite being in the total contact cast, is not progressing at the same rate as the 1 on the right. He is interested in trying a week without the cast [name] see how the wound does. The wounds are roughly the same size as last week, with the right perhaps being a little bit smaller. He continues [name] build up callus on both sites. 11/18/2021: Last week, I permitted the patient [name] go without his total contact cast, just [name] see if the cast was really making any difference. Today, both wounds have deteriorated [name] some extent, suggesting that the cast is providing benefit, at least on the left. Both are larger and have accumulated callus, slough, and other debris. 11/26/2021: I debrided both wounds quite aggressively last week in an effort [name] stimulate the healing cascade. This appears [name] have been effective as the left sided wound is a full centimeter shorter in length. Although the right was measured slightly larger, on inspection, it looks as though an area of epithelialized tissue was included in the measurements. We have been using PolyMem Ag on the wound surfaces with a total contact cast [name] the left. 12/02/2021: It appears that the intake personnel are including  epithelialized tissue in his wound measurements; the right wound is almost completely epithelialized; there is just a crater at the proximal midfoot with some open areas. On the left, he has built up some callus, but the overall wound surface looks good. There is some senescent skin around the wound margin. He has been in PolyMem Ag bilaterally with a total contact cast on the left. 12/09/2021: The right wound is nearly closed; there is just a small open area at the mid calcaneus. On the left, the wound is smaller with minimal callus buildup. No significant drainage. 12/16/2021: The right calcaneal wound remains minimally open at the mid calcaneus; the rest has  epithelialized. On the left, the wound is also a little bit smaller. There is some senescent tissue on the periphery. He is getting his first application of a trial skin substitute called Vendaje today. 12/23/2021: The wound on his right calcaneus is nearly closed; there is just a small area at the most distal aspect of the calcaneus that is open. On the left, the area where we applied [name] the skin substitute has a healthier-looking bed of granulation tissue. The wound dimensions are not significantly different on this side but the wound surface is improved. 12/30/2021: The wound on the right calcaneus has not changed significantly aside from some accumulation of callus. On the left, the open area is smaller and continues [name] have an improved surface. He continues [name] accumulate callus around the wound. He is here for his third application of Vendaje. 01/06/2022: The right calcaneal wound is down [name] just a couple of millimeters. He continues [name] accumulate periwound callus. He unfortunately got his cast wet earlier in the week and his left foot is macerated, resulting in some superficial skin loss just distal [name] the open wound. The open wound itself, however, is much smaller and has a healthier appearing surface. He is here for his fourth application of Vendaje. 01/13/2022: The right calcaneal wound is about the same. Unfortunately, once again, his cast got wet and his foot is again macerated. This is caused the left calcaneal wound [name] enlarge. He is here for his fifth application of Vendaje. 01/20/2022: The right calcaneal wound is very small. There is some periwound callus accumulation. He purchased a new cast protector last week and this has been effective in avoiding the maceration that has been occurring on the left. The left calcaneal wound is narrower and has a healthy and viable-appearing surface. He is here for his 6 application of Vendaje. 01/27/2022: The right calcaneal wound is down [name] just a pinhole.  There is some periwound slough and callus. On the left, the wound is narrower and shorter by about a centimeter. The surface is robust and viable-appearing. Unfortunately, the rep for the trial skin substitute product did not provide any for [place] [name] use today. 02/04/2022: The right calcaneal wound remains unchanged. There is more accumulated callus. On the left, although the intake nurse measured it a little bit longer, it looks about the same [name] me. The surface has a layer of slough, but underneath this, there is good granulation tissue. 02/10/2022: The right calcaneus wound is nearly closed. There is still some callus that builds up around the site. The left side looks about the same in terms of dimensions, but the surface is more robust and vital-appearing. 02/16/2022: The area of the right calcaneus that was nearly closed last week has closed, but there is a small opening at the mid foot where it looks like some  moisture got retained and caused some reopening. The left foot wound is narrower and shallower. Both sites have a fair amount of periwound callus and eschar. 02/24/2022: The small midfoot opening on the right calcaneus is a little bit smaller today. The left foot wound is narrower and shallower. He continues [name] accumulate periwound callus. No concern for infection. 03/01/2022: The patient came [name] clinic early because he showered and got his cast wet. Fortunately, there is no significant maceration [name] his foot but the callus softened and it looks like the wound on his left calcaneus may be a little bit wider. The wound on his right calcaneus is just a narrow slit. Continued accumulation of periwound callus bilaterally. [name], [name] ([name]) [id number] Page 11 of 14 03/08/2022: The wound on his right calcaneus is very nearly closed, just a small pinpoint opening under a bit of eschar; the left wound has come in quite a bit, as well. It is narrower and shorter than  at our last visit. Still with accumulated callus and eschar bilaterally. 03/17/2022: The right calcaneal wound is healed. The left wound is smaller and the surface itself is very clean, but there is some blue-green staining on the periwound callus, concerning for Pseudomonas aeruginosa. 03/23/2022: The right calcaneal wound remains closed. The left wound continues [name] contract. No further blue-green staining. Small amount of callus and slough accumulation. 03/28/2022: He came in early today because he had gotten his cast wet. On inspection, the wound itself did not get wet or macerated, just a little bit of the forefoot. The wound itself is basically unchanged. 04/07/2022: The right foot wound remains closed. The left wound is the smallest that I have seen it [name] date. It is narrower and shorter. It still continues [name] accumulate slough on the surface. 04/15/2022: There is a band of epithelium now dividing the small left plantar foot wound in 2. There is still some slough on the surface. 04/21/2022: The wound continues [name] narrow. Just a little bit of slough on the surface. He seems [name] be responding well [name] endoform. 04/28/2022: Continued slow contraction of the wound. There is a little slough on the surface and some periwound callus. We have been using endoform and total contact cast. 05/05/2022: The wound appears [name] have stalled. There is slough and some periwound eschar/callus. No concern for infection, however. 05/12/2022: Unfortunately, his right foot has reopened. It is located at the most posterior aspect of his surgical incision. The area was noted [name] have drainage coming from it when his padding was removed today. Underneath some callus and senescent skin, there is an opening. No purulent drainage or malodor. On the left foot, the wound is again unchanged. There is some light blue staining on the callus, but no malodor or purulent drainage. 10/13; right and left heel remanence of extensive plantar foot  wounds. These are better than I remember by quite a big margin however he is still left with wounds on the left plantar heel and the right plantar heel. Been using endoform bilaterally. A culture was done that showed apparently Pseudomonas but we are still waiting for the [name] antibiotic [name] use gentamicin today. He is still very active by description I am not sure about the offloading of his noncasted right foot 10/20; both wounds right and left heel debrided not much change from last week. [employer] has arrived which is linezolid, gentamicin and ciprofloxacin we will use this with endoform. T contact cast on the left otal Objective Constitutional Sitting or  standing Blood Pressure is within target range for patient.. Pulse regular and within target range for patient.Marland Kitchen Respirations regular, non-labored and within target range.. Temperature is normal and within the target range for the patient.Marland Kitchen Appears in no distress. Vitals Time Taken: 10:15 AM, Height: 69 in, Weight: 280 lbs, BMI: 41.3, Temperature: 99.0 F, Pulse: 84 bpm, Respiratory Rate: 20 breaths/min, Blood Pressure: 123/65 mmHg. General Notes: Wound exam; not much change from last week. Still thick raised edges around both wounds. The area on the left has a better surface superiorly. I used a #5 curette [name] try [name] clean up the edges and debris on the surface. There is no evidence of infection this does not probe [name] bone Integumentary (Hair, Skin) Wound #1 status is Open. Original cause of wound was Pressure Injury. The date acquired was: 10/07/2019. The wound has been in treatment 97 weeks. The wound is located on the Right Calcaneus. The wound measures 1.2cm length x 0.5cm width x 0.3cm depth; 0.471cm^2 area and 0.141cm^3 volume. There is Fat Layer (Subcutaneous Tissue) exposed. There is no tunneling or undermining noted. There is a none present amount of drainage noted. The wound margin is thickened. There is large (67-100%) pink  granulation within the wound bed. There is a small (1-33%) amount of necrotic tissue within the wound bed including Eschar and Adherent Slough. The periwound skin appearance had no abnormalities noted for moisture. The periwound skin appearance had no abnormalities noted for color. The periwound skin appearance exhibited: Callus. Periwound temperature was noted as No Abnormality. Wound #2 status is Open. Original cause of wound was Pressure Injury. The date acquired was: 10/07/2019. The wound has been in treatment 97 weeks. The wound is located on the Left Calcaneus. The wound measures 1.5cm length x 2cm width x 0.3cm depth; 2.356cm^2 area and 0.707cm^3 volume. There is Fat Layer (Subcutaneous Tissue) exposed. There is no tunneling or undermining noted. There is a medium amount of serosanguineous drainage noted. The wound margin is thickened. There is large (67-100%) red granulation within the wound bed. There is a small (1-33%) amount of necrotic tissue within the wound bed including Eschar and Adherent Slough. The periwound skin appearance had no abnormalities noted for color. The periwound skin appearance exhibited: Scarring, Maceration. Periwound temperature was noted as No Abnormality. Assessment Active Problems ICD-10 Non-pressure chronic ulcer of other part of right foot with fat layer exposed Non-pressure chronic ulcer of other part of left foot with fat layer exposed Procedures [name], [name] ([name]) [id number] Page 12 of 14 Wound #1 Pre-procedure diagnosis of Wound #1 is a Pressure Ulcer located on the Right Calcaneus . There was a Excisional Skin/Subcutaneous Tissue Debridement with a total area of 0.6 sq cm performed by [name], MD. With the following instrument(s): Curette [name] remove Non-Viable tissue/material. Material removed includes Subcutaneous Tissue and Slough and after achieving pain control using Lidocaine 5% topical ointment. No  specimens were taken. A time out was conducted at 11:45, prior [name] the start of the procedure. A Minimum amount of bleeding was controlled with Pressure. The procedure was tolerated well with a pain level of 0 throughout and a pain level of 0 following the procedure. Post Debridement Measurements: 1.2cm length x 0.5cm width x 0.3cm depth; 0.141cm^3 volume. Post debridement Stage noted as Category/Stage III. Character of Wound/Ulcer Post Debridement is improved. Post procedure Diagnosis Wound #1: Same as Pre-Procedure General Notes: Scribed for Dr [name] by [name] Wound #2 Pre-procedure diagnosis of Wound #2 is a Pressure Ulcer  located on the Left Calcaneus . There was a Excisional Skin/Subcutaneous Tissue Debridement with a total area of 3 sq cm performed by [name], MD. With the following instrument(s): Curette [name] remove Non-Viable tissue/material. Material removed includes Subcutaneous Tissue and Slough and after achieving pain control using Lidocaine 5% topical ointment. No specimens were taken. A time out was conducted at 11:45, prior [name] the start of the procedure. A Minimum amount of bleeding was controlled with Pressure. The procedure was tolerated well with a pain level of 0 throughout and a pain level of 0 following the procedure. Post Debridement Measurements: 1.5cm length x 2.5cm width x 0.3cm depth; 0.884cm^3 volume. Post debridement Stage noted as Category/Stage III. Character of Wound/Ulcer Post Debridement is improved. Post procedure Diagnosis Wound #2: Same as Pre-Procedure General Notes: Sxribed for Dr [name] by [name] Pre-procedure diagnosis of Wound #2 is a Pressure Ulcer located on the Left Calcaneus . There was a T Contact Cast Procedure by Deno Etienne MD. Post procedure Diagnosis Wound #2: Same as Pre-Procedure Plan Follow-up Appointments: Return Appointment in 1 week. - Dr [name] Room 3 06/02/22 at 0930 Anesthetic: Wound #2 Left  Calcaneus: (In clinic) Topical Lidocaine 4% applied [name] wound bed - In clinic Bathing/ Shower/ Hygiene: May shower with protection but do not get wound dressing(s) wet. - Cover with cast protector (can purchase cast protector at [facility] or [facility] ) Edema Control - Lymphedema / SCD / Other: Elevate legs [name] the level of the heart or above for 30 minutes daily and/or when sitting, a frequency of: - throughout the day Avoid standing for long periods of time. Moisturize legs daily. - right leg and foot every night. Off-Loading: T Contact Cast [name] Left Lower Extremity - TCC [name] Left LL otal Other: - keep pressure off of the bottom of your feet Additional Orders / Instructions: Follow Nutritious Diet Non Wound Condition: Other Non Wound Condition Orders/Instructions: - Right Heel-Use Opifoam and Border Foam and Kerlix/Medipore soft cloth surgical tape (4x10 in/yd) for Non wound on Right Heel. WOUND #1: - Calcaneus Wound Laterality: Right Cleanser: Normal Saline (Generic) Every Other Day/30 Days Discharge Instructions: Cleanse the wound with Normal Saline prior [name] applying a clean dressing using gauze sponges, not tissue or cotton balls. Cleanser: Wound Cleanser (Generic) Every Other Day/30 Days Discharge Instructions: Cleanse the wound with wound cleanser prior [name] applying a clean dressing using gauze sponges, not tissue or cotton balls. Prim Dressing: Endoform 2x2 in (Generic) Every Other Day/30 Days ary Discharge Instructions: Moisten with saline Secondary Dressing: ALLEVYN Heel 4 1/2in x 5 1/2in / 10.5cm x 13.5cm (Generic) Every Other Day/30 Days Discharge Instructions: Apply over primary dressing as directed. Secondary Dressing: Woven Gauze Sponge, Non-Sterile 4x4 in Every Other Day/30 Days Discharge Instructions: Apply over primary dressing as directed. Secured With: Child psychotherapist, Sterile 2x75 (in/in) (Generic) Every Other Day/30 Days Discharge Instructions: Secure with  stretch gauze as directed. Secured With: 18M Medipore H Soft Cloth Surgical T ape, 4 x 10 (in/yd) (Generic) Every Other Day/30 Days Discharge Instructions: Secure with tape as directed. WOUND #2: - Calcaneus Wound Laterality: Left Cleanser: Normal Saline (Generic) 1 x Per Week/30 Days Discharge Instructions: Cleanse the wound with Normal Saline prior [name] applying a clean dressing using gauze sponges, not tissue or cotton balls. Cleanser: Wound Cleanser 1 x Per Week/30 Days Discharge Instructions: Cleanse the wound with wound cleanser prior [name] applying a clean dressing using gauze sponges, not tissue or cotton balls. Topical: Gentamicin  1 x Per Week/30 Days Discharge Instructions: As directed by physician Topical: TCC 1 x Per Week/30 Days Discharge Instructions: [name] left leg Prim Dressing: Endoform 2x2 in 1 x Per Week/30 Days ary Discharge Instructions: Moisten with saline Secondary Dressing: Optifoam Non-Adhesive Dressing, 4x4 in (Generic) 1 x Per Week/30 Days Discharge Instructions: Apply over primary dressing as directed. Secondary Dressing: Woven Gauze Sponge, Non-Sterile 4x4 in 1 x Per Week/30 Days Discharge Instructions: Apply over primary dressing as directed. Secured With: 29M Medipore H Soft Cloth Surgical T ape, 4 x 10 (in/yd) (Generic) 1 x Per Week/30 Days Discharge Instructions: Secure with tape as directed. [name], [name] ([name]) [id number] Page 13 of 14 1. Keystone, endoform, Allevyn heels both sides. 2. T contact cast on the left otal 3. Cautioned [name] keep pressure off the right foot. This area we had previously and recently healed out but reopen very quickly 4. No evidence of infection in either area Electronic Signature(s) Signed: 05/27/2022 4:47:23 PM By: [name] Ham MD Entered By: [name] on 05/27/2022 [id number] -------------------------------------------------------------------------------- Total Contact Cast Details Patient  Name: Date of Service: [name], [place] 05/27/2022 10:15 A M Medical Record Number: [name] Patient Account Number: [id number] Date of Birth/Sex: Treating RN: 11-Dec-1975 (46 y.o. [name] Primary Care Provider: Cristie [name] Other Clinician: Referring Provider: Treating Provider/Extender: [name] in Treatment: 53 T Contact Cast Applied for Wound Assessment: otal Wound #2 Left Calcaneus Performed By: Physician [name], MD Post Procedure Diagnosis Same as Pre-procedure Electronic Signature(s) Signed: 05/27/2022 4:47:23 PM By: [name] Ham MD Signed: 05/27/2022 6:04:10 PM By: [name] Catholic RN Entered By: [name] on 05/27/2022 11:58:02 -------------------------------------------------------------------------------- SuperBill Details Patient Name: Date of Service: [name], [place] 05/27/2022 Medical Record Number: [name] Patient Account Number: [id number] Date of Birth/Sex: Treating RN: [birth year] (46 y.o. M) Primary Care Provider: Cristie [name] Other Clinician: Referring Provider: Treating Provider/Extender: [name] in Treatment: 97 Diagnosis Coding ICD-10 Codes Code Description [phone] Non-pressure chronic ulcer of other part of right foot with fat layer exposed L97.522 Non-pressure chronic ulcer of other part of left foot with fat layer exposed Facility Procedures : CPT4 Code: [id number] Description: 11042 - DEB SUBQ TISSUE 20 SQ CM/< ICD-10 Diagnosis Description L97.512 Non-pressure chronic ulcer of other part of right foot with fat layer exposed L97.522 Non-pressure chronic ulcer of other part of left foot with fat layer exposed Modifier: Quantity: 1 Physician Procedures : CPT4 Code Description Modifier [id number] - WC PHYS SUBQ TISS [place] OSKER, MANLEY E ([name]) [id number] Quantity: 1 46.pdf Page 14 of 14 : ICD-10 Diagnosis Description L97.512  Non-pressure chronic ulcer of other part of right foot with fat layer exposed L97.522 Non-pressure chronic ulcer of other part of left foot with fat layer exposed Quantity: Electronic Signature(s) Signed: 05/27/2022 4:47:23 PM By: [name] Ham MD Entered By: [name] on 05/27/2022 12:28:33

## 2022-05-28 ENCOUNTER — Other Ambulatory Visit: Payer: Self-pay | Admitting: Allergy & Immunology

## 2022-06-02 ENCOUNTER — Encounter (HOSPITAL_BASED_OUTPATIENT_CLINIC_OR_DEPARTMENT_OTHER): Payer: PPO | Admitting: General Surgery

## 2022-06-02 DIAGNOSIS — L89623 Pressure ulcer of left heel, stage 3: Secondary | ICD-10-CM | POA: Diagnosis not present

## 2022-06-02 DIAGNOSIS — L89613 Pressure ulcer of right heel, stage 3: Secondary | ICD-10-CM | POA: Diagnosis not present

## 2022-06-02 DIAGNOSIS — L97522 Non-pressure chronic ulcer of other part of left foot with fat layer exposed: Secondary | ICD-10-CM | POA: Diagnosis not present

## 2022-06-02 NOTE — Progress Notes (Signed)
[name], [name] ([id number]) [id number] Page 1 of 8 Visit Report for 06/02/2022 Arrival Information Details Patient Name: Date of Service: [name], [place] 06/02/2022 9:30 A M Medical Record Number: [id number] Patient Account Number: [id number] Date of Birth/Sex: Treating RN: 02/11/1976 (46 y.o. [name] Primary Care [name]: [name] Other Clinician: Referring [name]: Treating [name]/Extender: [name] in Treatment: 5 Visit Information History Since Last Visit Added or deleted any medications: No Patient Arrived: Wheel Chair Any new allergies or adverse reactions: No Arrival Time: 09:35 Had a fall or experienced change in No Accompanied By: spouse activities of daily living that may affect Transfer Assistance: None risk of falls: Patient Identification Verified: Yes Signs or symptoms of abuse/neglect since last visito No Patient Requires Transmission-Based Precautions: No Hospitalized since last visit: No Patient Has Alerts: No Implantable device outside of the clinic excluding No cellular tissue based products placed in the center since last visit: Has Dressing in Place as Prescribed: Yes Pain Present Now: No Electronic Signature(s) Signed: 06/02/2022 11:56:39 AM By: [name] Catholic RN Entered By: [name] Catholic on 06/02/2022 11:02:26 -------------------------------------------------------------------------------- Encounter Discharge Information Details Patient Name: Date of Service: [name], [place]. 06/02/2022 9:30 A M Medical Record Number: [id number] Patient Account Number: [id number] Date of Birth/Sex: Treating RN: 23-Jul-1976 (46 y.o. [name] Primary Care [name]: [name] Other Clinician: Referring [name]: Treating [name]/Extender: [name] in Treatment: 68 Encounter Discharge Information Items Post Procedure Vitals Discharge Condition:  Stable Temperature (F): 98.4 Ambulatory Status: Wheelchair Pulse (bpm): 86 Discharge Destination: Home Respiratory Rate (breaths/min): 16 Transportation: Private Auto Blood Pressure (mmHg): 146/101 Accompanied By: [name] Schedule Follow-up Appointment: Yes Clinical Summary of Care: Patient Declined Electronic Signature(s) Signed: 06/02/2022 11:56:39 AM By: [name] Catholic RN Entered By: [name] Catholic on 06/02/2022 11:55:54 [name] ([id number]) [id number] Page 2 of 8 -------------------------------------------------------------------------------- Lower Extremity Assessment Details Patient Name: Date of Service: [name] [place] 06/02/2022 9:30 A M Medical Record Number: [id number] Patient Account Number: [id number] Date of Birth/Sex: Treating RN: August 20, 1975 (46 y.o. [name] Primary Care [name]: [name] Other Clinician: Referring Leroy Trim: Treating [name]/Extender: [name] in Treatment: 98 Edema Assessment Assessed: [name]: No] [Right: No] Edema: [Left: N] [Right: o] Calf Left: Right: Point of Measurement: 29 cm From Medial Instep 41 cm 44 cm Ankle Left: Right: Point of Measurement: 9 cm From Medial Instep 25.2 cm 24.5 cm Vascular Assessment Pulses: Dorsalis Pedis Palpable: [Left:Yes] [Right:Yes] Electronic Signature(s) Signed: 06/02/2022 11:56:39 AM By: [name] Catholic RN Entered By: [name] Catholic on 06/02/2022 11:03:06 -------------------------------------------------------------------------------- Multi Wound Chart Details Patient Name: Date of Service: [name], [place] 06/02/2022 9:30 A M Medical Record Number: [id number] Patient Account Number: [id number] Date of Birth/Sex: Treating RN: [birth year] (46 y.o. M) Primary Care Adryan Shin: [name] Other Clinician: Referring Kinston Magnan: Treating Keller Bounds/Extender: [name] in Treatment: 51 [1:Photos:]  [N/A:N/A] Right Calcaneus Left Calcaneus N/A Wound Location: Pressure Injury Pressure Injury N/A Wounding Event: Pressure Ulcer Pressure Ulcer N/A Primary Etiology: Asthma, Angina, Hypertension Asthma, Angina, Hypertension N/A Comorbid History: 10/07/2019 10/07/2019 N/A Date Acquired: 98 98 N/A Weeks of Treatment: Open Open N/A Wound Status: [name], [name] ([id number]) [id number] Page 3 of 8 No No N/A Wound Recurrence: 0.1x0.1x0.1 1x1.5x0.2 N/A Measurements L x W x D (cm) 0.008 1.178 N/A A (cm) : rea 0.001 0.236 N/A Volume (cm) : 100.00% 95.70% N/A % Reduction in A rea: 100.00% 99.10% N/A % Reduction  in Volume: Category/Stage III Category/Stage III N/A Classification: Medium Medium N/A Exudate A mount: Serosanguineous Serosanguineous N/A Exudate Type: red, brown red, brown N/A Exudate Color: Thickened Thickened N/A Wound Margin: Large (67-100%) Large (67-100%) N/A Granulation A mount: Pink Red N/A Granulation Quality: Small (1-33%) Small (1-33%) N/A Necrotic A mount: Adherent Slough Eschar, Adherent Slough N/A Necrotic Tissue: Fat Layer (Subcutaneous Tissue): Yes Fat Layer (Subcutaneous Tissue): Yes N/A Exposed Structures: Fascia: No Fascia: No Tendon: No Tendon: No Muscle: No Muscle: No Joint: No Joint: No Bone: No Bone: No Small (1-33%) Small (1-33%) N/A Epithelialization: Debridement - Selective/Open Wound Debridement - Selective/Open Wound N/A Debridement: Pre-procedure Verification/Time Out 10:12 10:12 N/A Taken: Lidocaine 5% topical ointment Lidocaine 5% topical ointment N/A Pain Control: Callus, Slough Callus, Slough N/A Tissue Debrided: Non-Viable Tissue Non-Viable Tissue N/A Level: 0.01 1.5 N/A Debridement A (sq cm): rea Curette Curette N/A Instrument: Minimum Minimum N/A Bleeding: Pressure Pressure N/A Hemostasis A chieved: 0 0 N/A Procedural Pain: 0 0 N/A Post Procedural Pain: Procedure was  tolerated well Procedure was tolerated well N/A Debridement Treatment Response: 0.1x0.1x0.1 1x1.5x0.2 N/A Post Debridement Measurements L x W x D (cm) 0.001 0.236 N/A Post Debridement Volume: (cm) Category/Stage III Category/Stage III N/A Post Debridement Stage: Callus: Yes Scarring: Yes N/A Periwound Skin Texture: No Abnormalities Noted Maceration: Yes N/A Periwound Skin Moisture: No Abnormalities Noted No Abnormalities Noted N/A Periwound Skin Color: No Abnormality No Abnormality N/A Temperature: Debridement Debridement N/A Procedures Performed: T Contact Cast otal Treatment Notes Electronic Signature(s) Signed: 06/02/2022 10:37:03 AM By: Fredirick Maudlin MD FACS Entered By: Fredirick Maudlin on 06/02/2022 10:37:03 -------------------------------------------------------------------------------- Multi-Disciplinary Care Plan Details Patient Name: Date of Service: [name], [place] E. 06/02/2022 9:30 A M Medical Record Number: [id number] Patient Account Number: [id number] Date of Birth/Sex: Treating RN: 1975/11/01 (46 y.o. [name] Primary Care Yaser Harvill: [name] Other Clinician: Referring Donnelle Olmeda: Treating Ariam Mol/Extender: [name] in Treatment: 51 [place] reviewed with physician Active Inactive Wound/Skin Impairment Nursing Diagnoses: Impaired tissue integrity Knowledge deficit related to ulceration/compromised skin integrity JORDAN, PARDINI ([id number]) [phone].pdf Page 4 of 8 Goals: Patient/caregiver will verbalize understanding of skin care regimen Date Initiated: 07/15/2020 Target Resolution Date: 03/08/2023 Goal Status: Active Ulcer/skin breakdown will have a volume reduction of 30% by week 4 Date Initiated: 07/15/2020 Date Inactivated: 08/20/2020 Target Resolution Date: 09/03/2020 Goal Status: Unmet Unmet Reason: no major changes. Ulcer/skin breakdown will heal within 14  weeks Date Initiated: 12/04/2020 Date Inactivated: 12/10/2020 Target Resolution Date: 12/10/2020 Unmet Reason: wounds still open at 14 Goal Status: Unmet weeks and today 21 weeks. Interventions: Assess patient/caregiver ability to obtain necessary supplies Assess patient/caregiver ability to perform ulcer/skin care regimen upon admission and as needed Assess ulceration(s) every visit Provide education on ulcer and skin care Treatment Activities: Skin care regimen initiated : 07/15/2020 Topical wound management initiated : 07/15/2020 Notes: Electronic Signature(s) Signed: 06/02/2022 11:56:39 AM By: [name] Catholic RN Entered By: [name] Catholic on 06/02/2022 11:54:04 -------------------------------------------------------------------------------- Pain Assessment Details Patient Name: Date of Service: [name], [place] 06/02/2022 9:30 A M Medical Record Number: [id number] Patient Account Number: [id number] Date of Birth/Sex: Treating RN: [birth year] (46 y.o. [name] Primary Care Shaine Mount: [name] Other Clinician: Referring Devian Bartolomei: Treating Tyra Michelle/Extender: [name] in Treatment: 37 Active Problems Location of Pain Severity and Description of Pain Patient Has Paino No Site Locations Pain Management and Medication Current Pain Management: Electronic Signature(s) Signed: 06/02/2022 11:56:39 AM By: [name] Catholic RN Jonnie Finner, TONY10/26/2023  11:56:39 AM By: [name] Catholic RN Signed: E ([id number] Page 5 of 8 Entered By: [name] Catholic on 06/02/2022 11:02:34 -------------------------------------------------------------------------------- Patient/Caregiver Education Details Patient Name: Date of Service: [name] [place] 10/26/2023andnbsp9:30 A M Medical Record Number: [id number] Patient Account Number: [id number] Date of Birth/Gender: Treating RN: 02-05-1976 (46 y.o. [name] Primary Care  Physician: [name] Other Clinician: Referring Physician: Treating Physician/Extender: [name] in Treatment: 90 Education Assessment Education Provided To: Patient Education Topics Provided Wound/Skin Impairment: Methods: Explain/Verbal Responses: Return demonstration correctly Electronic Signature(s) Signed: 06/02/2022 11:56:39 AM By: [name] Catholic RN Entered By: [name] Catholic on 06/02/2022 11:54:22 -------------------------------------------------------------------------------- Wound Assessment Details Patient Name: Date of Service: [name], [place] 06/02/2022 9:30 A M Medical Record Number: [id number] Patient Account Number: [id number] Date of Birth/Sex: Treating RN: [birth year] (46 y.o. [name] Primary Care Andon Villard: [name] Other Clinician: Referring Constantin Hillery: Treating Joquan Lotz/Extender: [name] in Treatment: 68 Wound Status Wound Number: 1 Primary Etiology: Pressure Ulcer Wound Location: Right Calcaneus Wound Status: Open Wounding Event: Pressure Injury Comorbid History: Asthma, Angina, Hypertension Date Acquired: 10/07/2019 Weeks Of Treatment: 98 Clustered Wound: No Photos ARDEN, DELCASTILLO ([id number]) [id number] Page 6 of 8 Wound Measurements Length: (cm) 0.1 Width: (cm) 0.1 Depth: (cm) 0.1 Area: (cm) 0.008 Volume: (cm) 0.001 % Reduction in Area: 100% % Reduction in Volume: 100% Epithelialization: Small (1-33%) Tunneling: No Undermining: No Wound Description Classification: Category/Stage III Wound Margin: Thickened Exudate Amount: Medium Exudate Type: Serosanguineous Exudate Color: red, brown Foul Odor After Cleansing: No Slough/Fibrino Yes Wound Bed Granulation Amount: Large (67-100%) Exposed Structure Granulation Quality: Pink Fascia Exposed: No Necrotic Amount: Small (1-33%) Fat Layer (Subcutaneous Tissue) Exposed: Yes Necrotic Quality:  Adherent Slough Tendon Exposed: No Muscle Exposed: No Joint Exposed: No Bone Exposed: No Periwound Skin Texture Texture Color No Abnormalities Noted: No No Abnormalities Noted: Yes Callus: Yes Temperature / Pain Temperature: No Abnormality Moisture No Abnormalities Noted: Yes Treatment Notes Wound #1 (Calcaneus) Wound Laterality: Right Cleanser Normal Saline Discharge Instruction: Cleanse the wound with Normal Saline prior to applying a clean dressing using gauze sponges, not tissue or cotton balls. Wound Cleanser Discharge Instruction: Cleanse the wound with wound cleanser prior to applying a clean dressing using gauze sponges, not tissue or cotton balls. Peri-Wound Care Topical Primary Dressing Endoform 2x2 in Discharge Instruction: Moisten with saline KEYSTONE Secondary Dressing ALLEVYN Heel 4 1/2in x 5 1/2in / 10.5cm x 13.5cm Discharge Instruction: Apply over primary dressing as directed. Woven Gauze Sponge, Non-Sterile 4x4 in Discharge Instruction: Apply over primary dressing as directed. KEYSTONE Secured With Conforming Stretch Gauze Bandage, Sterile 2x75 (in/in) Discharge Instruction: Secure with stretch gauze as directed. 43M Medipore H Soft Cloth Surgical T ape, 4 x 10 (in/yd) Discharge Instruction: Secure with tape as directed. Compression Wrap Compression Stockings Add-Ons Electronic Signature(s) Signed: 06/02/2022 11:56:39 AM By: [name] Catholic RN Entered By: [name] Catholic on 06/02/2022 10:09:08 [name] ([id number] Page 7 of 8 -------------------------------------------------------------------------------- Wound Assessment Details Patient Name: Date of Service: [name] [place] E. 06/02/2022 9:30 A M Medical Record Number: [id number] Patient Account Number: [id number] Date of Birth/Sex: Treating RN: [birth year] (46 y.o. [name] Primary Care Kjersten [place]: [name] Other Clinician: Referring  Chaia Ikard: Treating Aidynn Polendo/Extender: [name] in Treatment: 33 Wound Status Wound Number: 2 Primary Etiology: Pressure Ulcer Wound Location: Left Calcaneus Wound Status: Open Wounding Event: Pressure Injury Comorbid History: Asthma, Angina, Hypertension Date Acquired: 10/07/2019 Weeks Of Treatment:  98 Clustered Wound: No Photos Wound Measurements Length: (cm) 1 Width: (cm) 1.5 Depth: (cm) 0.2 Area: (cm) 1.178 Volume: (cm) 0.236 % Reduction in Area: 95.7% % Reduction in Volume: 99.1% Epithelialization: Small (1-33%) Tunneling: No Undermining: No Wound Description Classification: Category/Stage III Wound Margin: Thickened Exudate Amount: Medium Exudate Type: Serosanguineous Exudate Color: red, brown Foul Odor After Cleansing: No Slough/Fibrino Yes Wound Bed Granulation Amount: Large (67-100%) Exposed Structure Granulation Quality: Red Fascia Exposed: No Necrotic Amount: Small (1-33%) Fat Layer (Subcutaneous Tissue) Exposed: Yes Necrotic Quality: Eschar, Adherent Slough Tendon Exposed: No Muscle Exposed: No Joint Exposed: No Bone Exposed: No Periwound Skin Texture Texture Color No Abnormalities Noted: No No Abnormalities Noted: Yes Scarring: Yes Temperature / Pain Temperature: No Abnormality Moisture No Abnormalities Noted: No Maceration: Yes Treatment Notes Wound #2 (Calcaneus) Wound Laterality: Left VERDON, GRIMAUD ([id number]) [id number] Page 8 of 8 Normal Saline Discharge Instruction: Cleanse the wound with Normal Saline prior to applying a clean dressing using gauze sponges, not tissue or cotton balls. Wound Cleanser Discharge Instruction: Cleanse the wound with wound cleanser prior to applying a clean dressing using gauze sponges, not tissue or cotton balls. Peri-Wound Care Topical TCC Discharge Instruction: to left leg Primary Dressing Endoform 2x2 in Discharge Instruction: Moisten  with saline KEYSTONE Secondary Dressing Optifoam Non-Adhesive Dressing, 4x4 in Discharge Instruction: Apply over primary dressing as directed. Woven Gauze Sponge, Non-Sterile 4x4 in Discharge Instruction: Apply over primary dressing as directed. Secured With 43M Medipore H Soft Cloth Surgical T ape, 4 x 10 (in/yd) Discharge Instruction: Secure with tape as directed. Compression Wrap Compression Stockings Add-Ons Electronic Signature(s) Signed: 06/02/2022 11:56:39 AM By: [name] Catholic RN Entered By: [name] Catholic on 06/02/2022 10:09:34 -------------------------------------------------------------------------------- Vitals Details Patient Name: Date of Service: [name], [place] 06/02/2022 9:30 A M Medical Record Number: [id number] Patient Account Number: [id number] Date of Birth/Sex: Treating RN: 08-May-1976 (46 y.o. [name] Primary Care Mary Hockey: [name] Other Clinician: Referring Tayt Moyers: Treating Jiayi Lengacher/Extender: [name] in Treatment: 30 Vital Signs Time Taken: 09:35 Temperature (F): 98.4 Height (in): 69 Pulse (bpm): 86 Weight (lbs): 280 Respiratory Rate (breaths/min): 16 Body Mass Index (BMI): 41.3 Blood Pressure (mmHg): 146/101 Reference Range: 80 - 120 mg / dl Electronic Signature(s) Signed: 06/02/2022 11:56:39 AM By: [name] Catholic RN Entered By: [name] Catholic on 06/02/2022 11:01:58

## 2022-06-02 NOTE — Progress Notes (Signed)
[name], [name] ([id number]) [id number] Page 1 of 16 Visit Report for 06/02/2022 Chief Complaint Document Details Patient Name: Date of Service: [name], [name] 06/02/2022 9:30 A M Medical Record Number: [id number] Patient Account Number: [id number] Date of Birth/Sex: Treating RN: 02/09/1976 (46 y.o. M) Primary Care Provider: [name] Other Clinician: Referring Provider: Treating Provider/Extender: [name] in Treatment: 63 Information Obtained from: Patient Chief Complaint Bilateral Plantar Foot Ulcers Electronic Signature(s) Signed: 06/02/2022 10:37:10 AM By: Fredirick Maudlin MD FACS Entered By: Fredirick Maudlin on 06/02/2022 10:37:10 -------------------------------------------------------------------------------- Debridement Details Patient Name: Date of Service: [name], [name] 06/02/2022 9:30 A M Medical Record Number: [id number] Patient Account Number: [id number] Date of Birth/Sex: Treating RN: 06/18/1976 (46 y.o. [name] Primary Care Provider: [name] Other Clinician: Referring Provider: Treating Provider/Extender: [name] in Treatment: 98 Debridement Performed for Assessment: Wound #1 Right Calcaneus Performed By: Physician Fredirick Maudlin, MD Debridement Type: Debridement Level of Consciousness (Pre-procedure): Awake and Alert Pre-procedure Verification/Time Out Yes - 10:12 Taken: Start Time: 10:12 Pain Control: Lidocaine 5% topical ointment T Area Debrided (L x W): otal 0.1 (cm) x 0.1 (cm) = 0.01 (cm) Tissue and other material debrided: Non-Viable, Callus, Slough, Slough Level: Non-Viable Tissue Debridement Description: Selective/Open Wound Instrument: Curette Bleeding: Minimum Hemostasis Achieved: Pressure End Time: 10:14 Procedural Pain: 0 Post Procedural Pain: 0 Response to Treatment: Procedure was tolerated well Level of Consciousness (Post- Awake and  Alert procedure): Post Debridement Measurements of Total Wound Length: (cm) 0.1 Stage: Category/Stage III Width: (cm) 0.1 Depth: (cm) 0.1 Volume: (cm) 0.001 Character of Wound/Ulcer Post Debridement: Improved Post Procedure Diagnosis [name], [name] ([id number]) [id number] Page 2 of 16 Same as Pre-procedure Notes Scribed for Dr. [name] by J.Scotton Electronic Signature(s) Signed: 06/02/2022 10:39:50 AM By: Fredirick Maudlin MD FACS Signed: 06/02/2022 11:56:39 AM By: [name] Entered By: [name] on 06/02/2022 10:16:10 -------------------------------------------------------------------------------- Debridement Details Patient Name: Date of Service: [name], [name] 06/02/2022 9:30 A M Medical Record Number: [id number] Patient Account Number: [id number] Date of Birth/Sex: Treating RN: 1975-09-02 (46 y.o. [name] Primary Care Provider: [name] Other Clinician: Referring Provider: Treating Provider/Extender: [name] in Treatment: 98 Debridement Performed for Assessment: Wound #2 Left Calcaneus Performed By: Physician Fredirick Maudlin, MD Debridement Type: Debridement Level of Consciousness (Pre-procedure): Awake and Alert Pre-procedure Verification/Time Out Yes - 10:12 Taken: Start Time: 10:12 Pain Control: Lidocaine 5% topical ointment T Area Debrided (L x W): otal 1 (cm) x 1.5 (cm) = 1.5 (cm) Tissue and other material debrided: Non-Viable, Callus, Slough, Slough Level: Non-Viable Tissue Debridement Description: Selective/Open Wound Instrument: Curette Bleeding: Minimum Hemostasis Achieved: Pressure End Time: 10:14 Procedural Pain: 0 Post Procedural Pain: 0 Response to Treatment: Procedure was tolerated well Level of Consciousness (Post- Awake and Alert procedure): Post Debridement Measurements of Total Wound Length: (cm) 1 Stage: Category/Stage III Width: (cm) 1.5 Depth: (cm)  0.2 Volume: (cm) 0.236 Character of Wound/Ulcer Post Debridement: Improved Post Procedure Diagnosis Same as Pre-procedure Notes Scribed for Dr. [name] by J.Scotton Electronic Signature(s) Signed: 06/02/2022 10:39:50 AM By: Fredirick Maudlin MD FACS Signed: 06/02/2022 11:56:39 AM By: [name] Entered By: [name] on 06/02/2022 10:17:22 [name] ([id number]) [id number] Page 3 of 16 -------------------------------------------------------------------------------- HPI Details Patient Name: Date of Service: [name] [name] 06/02/2022 9:30 A M Medical Record Number: [id number] Patient Account Number: [id number] Date of Birth/Sex: Treating RN: July 21, 1976 (46 y.o. M) Primary Care Provider:  [name] Other Clinician: Referring Provider: Treating Provider/Extender: [name] in Treatment: 44 History of Present Illness HPI Description: Wounds are12/03/2020 upon evaluation today patient presents for initial inspection here in our clinic concerning issues he has been having with the bottoms of his feet bilaterally. He states these actually occurred as wounds when he was hospitalized for 5 months secondary to Covid. He was apparently with tilting bed where he was in an upright position quite frequently and apparently this occurred in some way shape or form during that time. Fortunately there is no sign of active infection at this time. No fevers, chills, nausea, vomiting, or diarrhea. With that being said he still has substantial wounds on the plantar aspects of his feet Theragen require quite a bit of work to get these to heal. He has been using Santyl currently though that is been problematic both in receiving the medication as well as actually paid for it as it is become quite expensive. Prior to the experience with Covid the patient really did not have any major medical problems other than hypertension he does have  some mild generalized weakness following the Covid experience. 07/22/2020 on evaluation today patient appears to be doing okay in regard to his foot ulcers I feel like the wound beds are showing signs of better improvement that I do believe the Iodoflex is helping in this regard. With that being said he does have a lot of drainage currently and this is somewhat blue/green in nature which is consistent with Pseudomonas. I do think a culture today would be appropriate for [place] to evaluate and see if that is indeed the case I would likely start him on antibiotic orally as well he is not allergic to Cipro knows of no issues he has had in the past 12/21; patient was admitted to the clinic earlier this month with bilateral presumed pressure ulcers on the bottom of his feet apparently related to excessive pressure from a tilt table arrangement in the intensive care unit. Patient relates this to being on ECMO but I am not really sure that is exactly related to that. I must say I have never seen anything like this. He has fairly extensive full-thickness wounds extending from his heel towards his midfoot mostly centered laterally. There is already been some healing distally. He does not appear to have an arterial issue. He has been using gentamicin to the wound surfaces with Iodoflex to help with ongoing debridement 1/6; this is a patient with pressure ulcers on the bottom of his feet related to excessive pressure from a standing position in the intensive care unit. He is complaining of a lot of pain in the right heel. He is not a diabetic. 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 We have been using Iodoflex to help prepare the surfaces of both wounds for an advanced treatment product. He is nonambulatory spending most of his time in a wheelchair I have asked him not to propel the wheelchair with his heels 1/13; in general his wounds look better not much surface area change we have been  using Iodoflex as of last week. I did an x-ray of the right heel as the patient was complaining of pain. I had some thoughts about a stress fracture perhaps Achilles tendon problems however what it showed was erosive changes along the inferior aspect of the calcaneus he now has a MRI booked for 1/20. 1/20; in general his wounds continue to be better. Some improvement  in the large narrow areas proximally in his foot. He is still complaining of pain in the right heel and tenderness in certain areas of this wound. His MRI is tonight. I am not just looking for osteomyelitis that was brought up on the x-ray I am wondering about stress fractures, tendon ruptures etc. He has no such findings on the left. Also noteworthy is that the patient had critical illness neuropathy and some of the discomfort may be actual improvement in nerve function I am just not sure. These wounds were initially in the setting of severe critical illness related to COVID-19. He was put in a standing position. He may have also been on pressors at the point contributing to tissue ischemia. By his description at some point these wounds were grossly necrotic extending proximally up into the Achilles part of his heel. I do not know that I have ever really seen pictures of them like this although they may exist in epic We have ordered Tri layer Oasis. I am trying to stimulate some granulation in these areas. This is of course assuming the MRI is negative for infection 1/27; since the patient was last here he saw Dr. Juleen China of infectious disease. He is planned for vancomycin and ceftriaxone. Prior operative culture grew MSSA. Also ordered baseline lab work. He also ordered arterial studies although the ABIs in our clinic were normal as well as his clinical exam these were normal I do not think he needs to see vascular surgery. His ABIs at the [facility] were 1.22 in the right triphasic waveforms with a normal TBI of 1.15 on the left ABI of 1.22  with triphasic waveforms and a normal TBI of 1.08. Finally he saw Dr. Amalia Hailey who will follow him in for 2 months. At this point I do not think he felt that he needed a procedure on the right calcaneal bone. Dr. Juleen China is elected for broad-spectrum antibiotic The patient is still having pain in the right heel. He walks with a walker 2/3; wounds are generally smaller. He is tolerating his IV antibiotics. I believe this is vancomycin and ceftriaxone. We are still waiting for Oasis burn in terms of his out-of-pocket max which he should be meeting soon given the IV antibiotics, MRIs etc. I have asked him to check in on this. We are using silver collagen in the meantime the wounds look better 2/10; tolerating IV vancomycin and Rocephin. We are waiting to apply for Oasis. Although I am not really sure where he is in his out-of-pocket max. 2/17 started the first application of Oasis trilayer. Still on antibiotics. The wounds have generally look better. The area on the left has a little more surface slough requiring debridement [id number]; second application of Oasis trilayer. The wound surface granulation is generally look better. The area on the left with undermining laterally I think is come in a bit. 10/08/2020 upon evaluation today patient is here today for [employer] application #3. Fortunately he seems to be doing extremely well with regard to this and we are seeing a lot of new epithelial growth which is great news. Fortunately there is no signs of active infection at this time. 10/16/2020 upon evaluation today patient appears to be doing well with regard to his foot ulcers. Do believe the Oasis has been of benefit for him. I do not see any signs of infection right now which is great news and I think that he has a lot of new epithelial growth which is great to see  as well. The patient is very pleased to hear all of this. I do think we can proceed with the Oasis trilayer #4 today. 3/18; not as much  improvement in these areas on his heels that I was hoping. I did reapply trilateral Oasis today the tissue looks healthier but not as much fill in as I was hoping. 3/25; better looking today I think this is come in a bit the tissue looks healthier. Triple layer Oasis reapplied #6 4/1; somewhat better looking definitely better looking surface not as much change in surface area as I was hoping. He may be spending more time [place] on days then he needs to although he does have heel offloading boots. Triple layer Oasis reapplied #7 4/7; unfortunately apparently [facility] will not approve any further Oasis which is unfortunate since the patient did respond nicely both in terms of the condition of the wound bed as well as surface area. There is still some drainage coming from the wound but not a lot there does not appear to be any infection 4/15; we have been using Hydrofera Blue. He continues to have nice rims of epithelialization on the right greater than the left. The left the epithelialization is coming from the tip of his heel. There is moderate drainage. In this that concerns me about a total contact cast. There is no evidence of infection 4/29; patient has been using Hydrofera Blue with dressing changes. He has no complaints or issues today. 5/5; using Hydrofera Blue. I actually think that he looks marginally better than the last time I saw this 3 weeks ago. There are rims of epithelialization on the left thumb coming from the medial side on the right. Using [address] QUADRE, DABDOUB ([id number]) [id number] Page 4 of 16 5/12; using Hydrofera Blue. These continue to make improvements in surface area. His drainage was not listed as severe I therefore went ahead and put a cast on the left foot. Right foot we will continue to dress his previous 5/16; back for first total contact cast change. He did not tolerate this particularly well cast injury on the  anterior tibia among other issues. Difficulty sleeping. I talked him about this in some detail and afterwards is elected to continue. I told him I would like to have a cast on for 3 weeks to see if this is going to help at all. I think he agreed 5/19; I think the wound is better. There is no tunneling towards his midfoot. The undermining medially also looks better. He has a rim of new skin distally. I think we are making progress here. The area on the left also continues to look somewhat better to me using Hydrofera Blue. He has a list of complaints about the cast but none of them seem serious 5/26; patient presents for 1 week follow-up. He has been using a total contact cast and tolerating this well. Hydrofera Blue is the main dressing used. He denies signs of infection. 6/2 Hydrofera Blue total contact cast on the left. These were large ulcers that formed in intensive care unit where the patient was recovering from [place]. May have had something to do with being ventilated in an upright positiono Pressors etc. We have been able to get the areas down considerably and a viable surface. There is some epithelialization in both sides. Note made of drainage 6/9; changed to [facility] last time because of drainage. He arrives with better looking surfaces and dimensions on the left than the  right. Paradoxically the right actually probes more towards his midfoot the left is largely close down but both of these look improved. Using a total contact cast on the left 6/16; complex wounds on his bilateral plantar heels which were initially pressure injury from a stay in the ICU with COVID. We have been using silver alginate most recently. His dimensions of come in quite dramatically however not recently. We have been putting the left foot in a total contact cast 6/23; complex wounds on the bilateral plantar heels. I been putting the left in the cast paradoxically the area on the right is the one that is going  towards closure at a faster rate. Quite a bit of drainage on the left. The patient went to see Dr. Amalia Hailey who said he was going to standby for skin grafts. I had actually considered sending him for skin grafts however he would be mandatorily off his feet for a period of weeks to months. I am thinking that the area on the right is going to close on its own the area on the left has been more stubborn even though we have him in a total contact cast 6/30; took him out of a total contact cast last week is the right heel seem to be making better progress than the left where I was placing the cast. We are using silver alginate. Both wounds are smaller right greater than left 7/12; both wounds look as though they are making some progress. We are using silver alginate. Heel offloading boots 7/26; very gradual progress especially on the right. Using silver alginate. He is wearing heel offloading boots 8/18; he continues to close these wounds down very gradually. Using silver alginate. The problem polymen being definitive about this is areas of what appears to be callus around the margins. This is not a 100% of the area but certainly sizable especially on the right 9/1; bilateral plantar feet wounds secondary to prolonged pressure while being ventilated for COVID-19 in an upright position. Essentially pressure ulcers on the bottom of his feet. He is made substantial progress using silver alginate. 9/14; bilateral plantar feet wounds secondary to prolonged pressure. Making progress using silver alginate. 9/29 bilateral plantar feet wounds secondary to prolonged pressure. I changed him to Iodoflex last week. MolecuLight showing reddened blush fluorescence 10/11; patient presents for follow-up. He has no issues or complaints today. He denies signs of infection. He continues to use Iodoflex and antibiotic ointment to the wound beds. 10/27; 2-week follow-up. No evidence of infection. He has callus and thick dry skin  around the wound margins we have been using Iodoflex and Bactroban which was in response to a moderate left MolecuLight reddish blush fluorescence. 11/10; 2-week follow-up. Wound margins again have thick callus however the measurements of the actual wound sites are a lot smaller. Everything looks reasonably healthy here. We have been using Iodoflex He was approved for prime matrix but I have elected to delay this given the improvement in the surface area. Hopefully I will not regret that decision as were getting close to the end of the year in terms of insurance payment 12/8; 2-week follow-up. Wounds are generally smaller in size. These were initially substantial wounds extending into the forefoot all the way into the heel on the bilateral plantar feet. They are now both located on the plantar heel distal aspect both of these have a lot of callus around the wounds I used a #5 curette to remove this on the right and the left  also some subcutaneous debris to try and get the wound edges were using Iodoflex. He has heel offloading shoe 12/22; 2-week follow-up. Not really much improvement. He has thick callus around the outer edges of both wounds. I remove this there is some nonviable subcutaneous tissue as well. We have been using Iodoflex. Her intake nurse and myself spontaneously thought of a total contact cast I went back in May. At that time we really were not seeing much of an improvement with a cast although the wound was in a much different situation I would like to retry this in 2 weeks and I discussed this with the patient 08/12/2021; the patient has had some improvement with the Iodoflex. The the area on the left heel plantar more improved than the right. I had to put him in a total contact cast on the left although I decided to put that off for 2 weeks. I am going to change his primary dressing to silver collagen. I think in both areas he has had some improvement most of the healing seems to be  more proximal in the heel. The wounds are in the mid aspect. A lot of thick callus on the right heel however. 1/19; we are using silver collagen on both plantar heel areas. He has had some improvement today. The left did not require any debridement. He still had some eschar on the right that was debrided but both seem to have contracted. I did not put it total contact cast on him today 2/2 we have been using silver collagen. The area on the right plantar heel has areas that appear to be epithelialized interspersed with dry flaking callus and dry skin. I removed this. This really looks better than on the other side. On the left still a large area with raised edges and debris on the surface. 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 He has no issues or complaints today. 2/9; not much change in the left foot wound with 1 week of a cast we are using silver collagen. Silver collagen on the right side. The right side has been the better wound surface. We will reapply the total contact cast on the left 2/16; not much improvement on either side I been using silver collagen with a total contact cast on the left. I'm changing the Hydrofera Blue still with a total contact cast on the left 2/23; some improvement on both sides. Disappointing that he has thick callus around the area that we are putting in a total contact cast on the left. We've been using Hydrofera Blue on both wound areas. This is a man who at essentially pressure ulcers in addition to ischemia caused by medications to support his blood pressure (pressors) in the ICU. He was being ventilated in the standing position for severe Covid. A Shiley the wounds extended across his entire foot but are now localized to his plantar heels bilaterally. We have made progress however neither areas healed. I continue to think the  total contact cast is helped albeit painstakingly slowly. He has never wanted a plastic surgery consult although I don't know that they would be interested in grafting in area in this location. 10/07/2021: Continued improvement bilaterally. There is still some callus around the left wound, despite the total contact cast. He has some increased pain in his right midfoot around 1 particular area. This  has been painful in the past but seems to be a little bit worse. When his cast was removed today, there was an area on the heel of the left foot that looks a bit macerated. He is also complaining of pain in his left thigh and hip which he thinks is secondary to the limb length discrepancy caused by the cast. 10/14/2021: He continues to improve. A little bit less callus around the left wound. He continues to endorse pain in his right midfoot, but this is not as significant as it was last week. The maceration on his left heel is improved. 10/21/2021: Continued improvement to both wounds. The maceration on his left heel is no longer evident. Less callus bilaterally. Epithelialization progressing. 10/28/2021: Significant improvement this week. The right sided wound is nearly closed with just a small open area at the middle. No maceration seen on the left heel. Continued epithelialization on both sides. No concern for infection. 11/04/2021: T oday, the wounds were measured a little bit differently and come out as larger, but I actually think they are about the same to potentially even smaller, particularly on the left. He continues to accumulate some callus on the right. [name], [name] ([id number]) [id number] Page 5 of 16 11/11/2021: T oday, the patient is expressing some concern that the left wound, despite being in the total contact cast, is not progressing at the same rate as the 1 on the right. He is interested in trying a week without the cast to see how the wound does. The wounds are  roughly the same size as last week, with the right perhaps being a little bit smaller. He continues to build up callus on both sites. 11/18/2021: Last week, I permitted the patient to go without his total contact cast, just to see if the cast was really making any difference. Today, both wounds have deteriorated to some extent, suggesting that the cast is providing benefit, at least on the left. Both are larger and have accumulated callus, slough, and other debris. 11/26/2021: I debrided both wounds quite aggressively last week in an effort to stimulate the healing cascade. This appears to have been effective as the left sided wound is a full centimeter shorter in length. Although the right was measured slightly larger, on inspection, it looks as though an area of epithelialized tissue was included in the measurements. We have been using PolyMem Ag on the wound surfaces with a total contact cast to the left. 12/02/2021: It appears that the intake personnel are including epithelialized tissue in his wound measurements; the right wound is almost completely epithelialized; there is just a crater at the proximal midfoot with some open areas. On the left, he has built up some callus, but the overall wound surface looks good. There is some senescent skin around the wound margin. He has been in PolyMem Ag bilaterally with a total contact cast on the left. 12/09/2021: The right wound is nearly closed; there is just a small open area at the mid calcaneus. On the left, the wound is smaller with minimal callus buildup. No significant drainage. 12/16/2021: The right calcaneal wound remains minimally open at the mid calcaneus; the rest has epithelialized. On the left, the wound is also a little bit smaller. There is some senescent tissue on the periphery. He is getting his first application of a trial skin substitute called Vendaje today. 12/23/2021: The wound on his right calcaneus is nearly closed; there is just a  small area at the most distal  aspect of the calcaneus that is open. On the left, the area where we applied to the skin substitute has a healthier-looking bed of granulation tissue. The wound dimensions are not significantly different on this side but the wound surface is improved. 12/30/2021: The wound on the right calcaneus has not changed significantly aside from some accumulation of callus. On the left, the open area is smaller and continues to have an improved surface. He continues to accumulate callus around the wound. He is here for his third application of Vendaje. 01/06/2022: The right calcaneal wound is down to just a couple of millimeters. He continues to accumulate periwound callus. He unfortunately got his cast wet earlier in the week and his left foot is macerated, resulting in some superficial skin loss just distal to the open wound. The open wound itself, however, is much smaller and has a healthier appearing surface. He is here for his fourth application of Vendaje. 01/13/2022: The right calcaneal wound is about the same. Unfortunately, once again, his cast got wet and his foot is again macerated. This is caused the left calcaneal wound to enlarge. He is here for his fifth application of Vendaje. 01/20/2022: The right calcaneal wound is very small. There is some periwound callus accumulation. He purchased a new cast protector last week and this has been effective in avoiding the maceration that has been occurring on the left. The left calcaneal wound is narrower and has a healthy and viable-appearing surface. He is here for his 6 application of Vendaje. 01/27/2022: The right calcaneal wound is down to just a pinhole. There is some periwound slough and callus. On the left, the wound is narrower and shorter by about a centimeter. The surface is robust and viable-appearing. Unfortunately, the rep for the trial skin substitute product did not provide any for [place] to use today. 02/04/2022: The right  calcaneal wound remains unchanged. There is more accumulated callus. On the left, although the intake nurse measured it a little bit longer, it looks about the same to me. The surface has a layer of slough, but underneath this, there is good granulation tissue. 02/10/2022: The right calcaneus wound is nearly closed. There is still some callus that builds up around the site. The left side looks about the same in terms of dimensions, but the surface is more robust and vital-appearing. 02/16/2022: The area of the right calcaneus that was nearly closed last week has closed, but there is a small opening at the mid foot where it looks like some moisture got retained and caused some reopening. The left foot wound is narrower and shallower. Both sites have a fair amount of periwound callus and eschar. 02/24/2022: The small midfoot opening on the right calcaneus is a little bit smaller today. The left foot wound is narrower and shallower. He continues to accumulate periwound callus. No concern for infection. 03/01/2022: The patient came to clinic early because he showered and got his cast wet. Fortunately, there is no significant maceration to his foot but the callus softened and it looks like the wound on his left calcaneus may be a little bit wider. The wound on his right calcaneus is just a narrow slit. Continued accumulation of periwound callus bilaterally. 03/08/2022: The wound on his right calcaneus is very nearly closed, just a small pinpoint opening under a bit of eschar; the left wound has come in quite a bit, as well. It is narrower and shorter than at our last visit. Still with accumulated callus and eschar  bilaterally. 03/17/2022: The right calcaneal wound is healed. The left wound is smaller and the surface itself is very clean, but there is some blue-green staining on the periwound callus, concerning for Pseudomonas aeruginosa. 03/23/2022: The right calcaneal wound remains closed. The left wound continues  to contract. No further blue-green staining. Small amount of callus and slough accumulation. 03/28/2022: He came in early today because he had gotten his cast wet. On inspection, the wound itself did not get wet or macerated, just a little bit of the forefoot. The wound itself is basically unchanged. 04/07/2022: The right foot wound remains closed. The left wound is the smallest that I have seen it to date. It is narrower and shorter. It still continues to accumulate slough on the surface. 04/15/2022: There is a band of epithelium now dividing the small left plantar foot wound in 2. There is still some slough on the surface. 04/21/2022: The wound continues to narrow. Just a little bit of slough on the surface. He seems to be responding well to endoform. 04/28/2022: Continued slow contraction of the wound. There is a little slough on the surface and some periwound callus. We have been using endoform and total contact cast. 05/05/2022: The wound appears to have stalled. There is slough and some periwound eschar/callus. No concern for infection, however. 05/12/2022: Unfortunately, his right foot has reopened. It is located at the most posterior aspect of his surgical incision. The area was noted to have drainage coming from it when his padding was removed today. Underneath some callus and senescent skin, there is an opening. No purulent drainage or malodor. On the left foot, the wound is again unchanged. There is some light blue staining on the callus, but no malodor or purulent drainage. 10/13; right and left heel remanence of extensive plantar foot wounds. These are better than I remember by quite a big margin however he is still left with wounds on the left plantar heel and the right plantar heel. Been using endoform bilaterally. A culture was done that showed apparently Pseudomonas but we are still waiting for the [facility] antibiotic to use gentamicin today. He is still very active by description I am not  sure about the offloading of his noncasted right [name], [name] ([id number]) [id number] Page 6 of 16 foot 10/20; both wounds right and left heel debrided not much change from last week. [employer] has arrived which is linezolid, gentamicin and ciprofloxacin we will use this with endoform. T contact cast on the left otal 06/02/2022: Both wounds are smaller today. There is still a fair amount of callus buildup around the right foot ulcer. The left is more superficial and nearly flush with the surrounding tissues. Also with slough and eschar buildup. Electronic Signature(s) Signed: 06/02/2022 10:38:01 AM By: Fredirick Maudlin MD FACS Entered By: Fredirick Maudlin on 06/02/2022 10:38:01 -------------------------------------------------------------------------------- Physical Exam Details Patient Name: Date of Service: [name], [place] 06/02/2022 9:30 A M Medical Record Number: [id number] Patient Account Number: [id number] Date of Birth/Sex: Treating RN: 1975/09/15 (46 y.o. M) Primary Care Provider: [name] Other Clinician: Referring Provider: Treating Provider/Extender: [name] in Treatment: 101 Constitutional No acute distress.Marland Kitchen Respiratory Normal work of breathing on room air.. Notes 06/02/2022: Both wounds are smaller today. There is still a fair amount of callus buildup around the right foot ulcer. The left is more superficial and nearly flush with the surrounding tissues. Also with slough and eschar buildup. Electronic Signature(s) Signed: 06/02/2022 10:38:31 AM By: Fredirick Maudlin MD FACS  Entered By: Fredirick Maudlin on 06/02/2022 10:38:31 -------------------------------------------------------------------------------- Physician Orders Details Patient Name: Date of Service: [name] [employer] 06/02/2022 9:30 A M Medical Record Number: [id number] Patient Account Number: [id number] Date of Birth/Sex: Treating RN: [birth year]  (46 y.o. [name] Primary Care Provider: [name] Other Clinician: Referring Provider: Treating Provider/Extender: [name] in Treatment: 46 Verbal / Phone Orders: No Diagnosis Coding ICD-10 Coding Code Description L97.512 Non-pressure chronic ulcer of other part of right foot with fat layer exposed L97.522 Non-pressure chronic ulcer of other part of left foot with fat layer exposed Follow-up Appointments ppointment in 1 week. - Dr [name] Room 3 Return A Anesthetic Wound #2 Left Calcaneus (In clinic) Topical Lidocaine 4% applied to wound bed - In clinic [name], [name] ([id number]) [id number] Page 7 of 16 Bathing/ Shower/ Hygiene May shower with protection but do not get wound dressing(s) wet. - Cover with cast protector (can purchase cast protector at [facility] or [facility] ) Edema Control - Lymphedema / SCD / Other Bilateral Lower Extremities Elevate legs to the level of the heart or above for 30 minutes daily and/or when sitting, a frequency of: - throughout the day Avoid standing for long periods of time. Moisturize legs daily. - right leg and foot every night. Off-Loading Total Contact Cast to Left Lower Extremity - TCC to Left LL Other: - keep pressure off of the bottom of your feet Additional Orders / Instructions Follow Nutritious Diet - Premier Protein 30g good source for a nutritional supplement. Non Wound Condition Right Lower Extremity Other Non Wound Condition Orders/Instructions: - Right Heel-Use Opifoam and Border Foam and Kerlix/Medipore soft cloth surgical tape (4x10 in/yd) for Non wound on Right Heel. Wound Treatment Wound #1 - Calcaneus Wound Laterality: Right Cleanser: Normal Saline (Generic) Every Other Day/30 Days Discharge Instructions: Cleanse the wound with Normal Saline prior to applying a clean dressing using gauze sponges, not tissue or cotton balls. Cleanser: Wound Cleanser (Generic)  Every Other Day/30 Days Discharge Instructions: Cleanse the wound with wound cleanser prior to applying a clean dressing using gauze sponges, not tissue or cotton balls. Prim Dressing: Endoform 2x2 in (Generic) Every Other Day/30 Days ary Discharge Instructions: Moisten with saline Prim Dressing: KEYSTONE ary Every Other Day/30 Days Secondary Dressing: ALLEVYN Heel 4 1/2in x 5 1/2in / 10.5cm x 13.5cm (Generic) Every Other Day/30 Days Discharge Instructions: Apply over primary dressing as directed. Secondary Dressing: Woven Gauze Sponge, Non-Sterile 4x4 in Every Other Day/30 Days Discharge Instructions: Apply over primary dressing as directed. Secondary Dressing: KEYSTONE Every Other Day/30 Days Secured With: Conforming Stretch Gauze Bandage, Sterile 2x75 (in/in) (Generic) Every Other Day/30 Days Discharge Instructions: Secure with stretch gauze as directed. Secured With: 45M Medipore H Soft Cloth Surgical T ape, 4 x 10 (in/yd) (Generic) Every Other Day/30 Days Discharge Instructions: Secure with tape as directed. Wound #2 - Calcaneus Wound Laterality: Left Cleanser: Normal Saline (Generic) 1 x Per Week/30 Days Discharge Instructions: Cleanse the wound with Normal Saline prior to applying a clean dressing using gauze sponges, not tissue or cotton balls. Cleanser: Wound Cleanser 1 x Per Week/30 Days Discharge Instructions: Cleanse the wound with wound cleanser prior to applying a clean dressing using gauze sponges, not tissue or cotton balls. Topical: TCC 1 x Per Week/30 Days Discharge Instructions: to left leg Prim Dressing: Endoform 2x2 in 1 x Per Week/30 Days ary Discharge Instructions: Moisten with saline Prim Dressing: KEYSTONE ary 1 x Per Week/30 Days Secondary Dressing: Optifoam Non-Adhesive Dressing,  4x4 in (Generic) 1 x Per Week/30 Days Discharge Instructions: Apply over primary dressing as directed. Secondary Dressing: Woven Gauze Sponge, Non-Sterile 4x4 in 1 x Per Week/30  Days Discharge Instructions: Apply over primary dressing as directed. Secured With: 64M Medipore H Soft Cloth Surgical T ape, 4 x 10 (in/yd) (Generic) 1 x Per Week/30 Days Discharge Instructions: Secure with tape as directed. Electronic Signature(s) Signed: 06/02/2022 11:53:22 AM By: Fredirick Maudlin MD Nelly Rout ([id number]) [id number] Page 8 of 16 Signed: 06/02/2022 11:56:39 AM By: [name] Entered By: [name] on 06/02/2022 11:06:50 -------------------------------------------------------------------------------- Problem List Details Patient Name: Date of Service: [name], [place] 06/02/2022 9:30 A M Medical Record Number: [id number] Patient Account Number: [id number] Date of Birth/Sex: Treating RN: 11-30-1975 (46 y.o. M) Primary Care Provider: [name] Other Clinician: Referring Provider: Treating Provider/Extender: [name] in Treatment: 63 Active Problems ICD-10 Encounter Code Description Active Date MDM Diagnosis L97.512 Non-pressure chronic ulcer of other part of right foot with fat layer exposed 09/03/2020 No Yes L97.522 Non-pressure chronic ulcer of other part of left foot with fat layer exposed 09/03/2020 No Yes Inactive Problems ICD-10 Code Description Active Date Inactive Date L89.893 Pressure ulcer of other site, stage 3 07/15/2020 07/15/2020 M62.81 Muscle weakness (generalized) 07/15/2020 07/15/2020 I10 Essential (primary) hypertension 07/15/2020 07/15/2020 M86.171 Other acute osteomyelitis, right ankle and foot 09/03/2020 09/03/2020 Resolved Problems Electronic Signature(s) Signed: 06/02/2022 10:36:58 AM By: Fredirick Maudlin MD FACS Entered By: Fredirick Maudlin on 06/02/2022 10:36:57 -------------------------------------------------------------------------------- Progress Note Details Patient Name: Date of Service: [name], [place] 06/02/2022 9:30 A M Medical Record Number:  [id number] Patient Account Number: [id number] Date of Birth/Sex: Treating RN: 11/30/1975 (46 y.o. M) Primary Care Provider: [name] Other Clinician: Referring Provider: Treating Provider/Extender: [name] in Treatment: [address] ([id number]) [id number] Page 9 of 16 Subjective Chief Complaint Information obtained from Patient Bilateral Plantar Foot Ulcers History of Present Illness (HPI) Wounds are12/03/2020 upon evaluation today patient presents for initial inspection here in our clinic concerning issues he has been having with the bottoms of his feet bilaterally. He states these actually occurred as wounds when he was hospitalized for 5 months secondary to Covid. He was apparently with tilting bed where he was in an upright position quite frequently and apparently this occurred in some way shape or form during that time. Fortunately there is no sign of active infection at this time. No fevers, chills, nausea, vomiting, or diarrhea. With that being said he still has substantial wounds on the plantar aspects of his feet Theragen require quite a bit of work to get these to heal. He has been using Santyl currently though that is been problematic both in receiving the medication as well as actually paid for it as it is become quite expensive. Prior to the experience with Covid the patient really did not have any major medical problems other than hypertension he does have some mild generalized weakness following the Covid experience. 07/22/2020 on evaluation today patient appears to be doing okay in regard to his foot ulcers I feel like the wound beds are showing signs of better improvement that I do believe the Iodoflex is helping in this regard. With that being said he does have a lot of drainage currently and this is somewhat blue/green in nature which is consistent with Pseudomonas. I do think a culture today would be appropriate  for [place] to evaluate and see if that is indeed the  case I would likely start him on antibiotic orally as well he is not allergic to Cipro knows of no issues he has had in the past 12/21; patient was admitted to the clinic earlier this month with bilateral presumed pressure ulcers on the bottom of his feet apparently related to excessive pressure from a tilt table arrangement in the intensive care unit. Patient relates this to being on ECMO but I am not really sure that is exactly related to that. I must say I have never seen anything like this. He has fairly extensive full-thickness wounds extending from his heel towards his midfoot mostly centered laterally. There is already been some healing distally. He does not appear to have an arterial issue. He has been using gentamicin to the wound surfaces with Iodoflex to help with ongoing debridement 1/6; this is a patient with pressure ulcers on the bottom of his feet related to excessive pressure from a standing position in the intensive care unit. He is complaining of a lot of pain in the right heel. He is not a diabetic. 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 We have been using Iodoflex to help prepare the surfaces of both wounds for an advanced treatment product. He is nonambulatory spending most of his time in a wheelchair I have asked him not to propel the wheelchair with his heels 1/13; in general his wounds look better not much surface area change we have been using Iodoflex as of last week. I did an x-ray of the right heel as the patient was complaining of pain. I had some thoughts about a stress fracture perhaps Achilles tendon problems however what it showed was erosive changes along the inferior aspect of the calcaneus he now has a MRI booked for 1/20. 1/20; in general his wounds continue to be better. Some improvement in the large narrow areas proximally in his foot. He is still complaining of pain in the right heel  and tenderness in certain areas of this wound. His MRI is tonight. I am not just looking for osteomyelitis that was brought up on the x-ray I am wondering about stress fractures, tendon ruptures etc. He has no such findings on the left. Also noteworthy is that the patient had critical illness neuropathy and some of the discomfort may be actual improvement in nerve function I am just not sure. These wounds were initially in the setting of severe critical illness related to COVID-19. He was put in a standing position. He may have also been on pressors at the point contributing to tissue ischemia. By his description at some point these wounds were grossly necrotic extending proximally up into the Achilles part of his heel. I do not know that I have ever really seen pictures of them like this although they may exist in epic We have ordered Tri layer Oasis. I am trying to stimulate some granulation in these areas. This is of course assuming the MRI is negative for infection 1/27; since the patient was last here he saw Dr. Juleen China of infectious disease. He is planned for vancomycin and ceftriaxone. Prior operative culture grew MSSA. Also ordered baseline lab work. He also ordered arterial studies although the ABIs in our clinic were normal as well as his clinical exam these were normal I do not think he needs to see vascular surgery. His ABIs at the [facility] were 1.22 in the right triphasic waveforms with a normal TBI of 1.15 on the left ABI of 1.22 with triphasic waveforms  and a normal TBI of 1.08. Finally he saw Dr. Amalia Hailey who will follow him in for 2 months. At this point I do not think he felt that he needed a procedure on the right calcaneal bone. Dr. Juleen China is elected for broad-spectrum antibiotic The patient is still having pain in the right heel. He walks with a walker 2/3; wounds are generally smaller. He is tolerating his IV antibiotics. I believe this is vancomycin and ceftriaxone. We are still  waiting for Oasis burn in terms of his out-of-pocket max which he should be meeting soon given the IV antibiotics, MRIs etc. I have asked him to check in on this. We are using silver collagen in the meantime the wounds look better 2/10; tolerating IV vancomycin and Rocephin. We are waiting to apply for Oasis. Although I am not really sure where he is in his out-of-pocket max. 2/17 started the first application of Oasis trilayer. Still on antibiotics. The wounds have generally look better. The area on the left has a little more surface slough requiring debridement [id number]; second application of Oasis trilayer. The wound surface granulation is generally look better. The area on the left with undermining laterally I think is come in a bit. 10/08/2020 upon evaluation today patient is here today for [employer] application #3. Fortunately he seems to be doing extremely well with regard to this and we are seeing a lot of new epithelial growth which is great news. Fortunately there is no signs of active infection at this time. 10/16/2020 upon evaluation today patient appears to be doing well with regard to his foot ulcers. Do believe the Oasis has been of benefit for him. I do not see any signs of infection right now which is great news and I think that he has a lot of new epithelial growth which is great to see as well. The patient is very pleased to hear all of this. I do think we can proceed with the Oasis trilayer #4 today. 3/18; not as much improvement in these areas on his heels that I was hoping. I did reapply trilateral Oasis today the tissue looks healthier but not as much fill in as I was hoping. 3/25; better looking today I think this is come in a bit the tissue looks healthier. Triple layer Oasis reapplied #6 4/1; somewhat better looking definitely better looking surface not as much change in surface area as I was hoping. He may be spending more time [place] on days then he needs to although he does  have heel offloading boots. Triple layer Oasis reapplied #7 4/7; unfortunately apparently [facility] will not approve any further Oasis which is unfortunate since the patient did respond nicely both in terms of the condition of the wound bed as well as surface area. There is still some drainage coming from the wound but not a lot there does not appear to be any infection 4/15; we have been using Hydrofera Blue. He continues to have nice rims of epithelialization on the right greater than the left. The left the epithelialization is coming from the tip of his heel. There is moderate drainage. In this that concerns me about a total contact cast. There is no evidence of infection 4/29; patient has been using Hydrofera Blue with dressing changes. He has no complaints or issues today. 5/5; using Hydrofera Blue. I actually think that he looks marginally better than the last time I saw this 3 weeks ago. There are rims of epithelialization on the  left thumb coming from the medial side on the right. Using Hydrofera Blue 5/12; using Hydrofera Blue. These continue to make improvements in surface area. His drainage was not listed as severe I therefore went ahead and put a cast on the left foot. Right foot we will continue to dress his previous 5/16; back for first total contact cast change. He did not tolerate this particularly well cast injury on the anterior tibia among other issues. Difficulty sleeping. I talked him about this in some detail and afterwards is elected to continue. I told him I would like to have a cast on for 3 weeks to see if this is going to help at all. I think he agreed 5/19; I think the wound is better. There is no tunneling towards his midfoot. The undermining medially also looks better. He has a rim of new skin distally. I think AKEIM, REFUERZO ([id number]) [id number] Page 10 of 16 we are making progress here. The area on the left also continues to  look somewhat better to me using Hydrofera Blue. He has a list of complaints about the cast but none of them seem serious 5/26; patient presents for 1 week follow-up. He has been using a total contact cast and tolerating this well. Hydrofera Blue is the main dressing used. He denies signs of infection. 6/2 Hydrofera Blue total contact cast on the left. These were large ulcers that formed in intensive care unit where the patient was recovering from [place]. May have had something to do with being ventilated in an upright positiono Pressors etc. We have been able to get the areas down considerably and a viable surface. There is some epithelialization in both sides. Note made of drainage 6/9; changed to [name] last time because of drainage. He arrives with better looking surfaces and dimensions on the left than the right. Paradoxically the right actually probes more towards his midfoot the left is largely close down but both of these look improved. Using a total contact cast on the left 6/16; complex wounds on his bilateral plantar heels which were initially pressure injury from a stay in the ICU with COVID. We have been using silver alginate most recently. His dimensions of come in quite dramatically however not recently. We have been putting the left foot in a total contact cast 6/23; complex wounds on the bilateral plantar heels. I been putting the left in the cast paradoxically the area on the right is the one that is going towards closure at a faster rate. Quite a bit of drainage on the left. The patient went to see Dr. Amalia Hailey who said he was going to standby for skin grafts. I had actually considered sending him for skin grafts however he would be mandatorily off his feet for a period of weeks to months. I am thinking that the area on the right is going to close on its own the area on the left has been more stubborn even though we have him in a total contact cast 6/30; took him out of a total  contact cast last week is the right heel seem to be making better progress than the left where I was placing the cast. We are using silver alginate. Both wounds are smaller right greater than left 7/12; both wounds look as though they are making some progress. We are using silver alginate. Heel offloading boots 7/26; very gradual progress especially on the right. Using silver alginate. He is wearing heel offloading boots 8/18; he continues to  close these wounds down very gradually. Using silver alginate. The problem polymen being definitive about this is areas of what appears to be callus around the margins. This is not a 100% of the area but certainly sizable especially on the right 9/1; bilateral plantar feet wounds secondary to prolonged pressure while being ventilated for COVID-19 in an upright position. Essentially pressure ulcers on the bottom of his feet. He is made substantial progress using silver alginate. 9/14; bilateral plantar feet wounds secondary to prolonged pressure. Making progress using silver alginate. 9/29 bilateral plantar feet wounds secondary to prolonged pressure. I changed him to Iodoflex last week. MolecuLight showing reddened blush fluorescence 10/11; patient presents for follow-up. He has no issues or complaints today. He denies signs of infection. He continues to use Iodoflex and antibiotic ointment to the wound beds. 10/27; 2-week follow-up. No evidence of infection. He has callus and thick dry skin around the wound margins we have been using Iodoflex and Bactroban which was in response to a moderate left MolecuLight reddish blush fluorescence. 11/10; 2-week follow-up. Wound margins again have thick callus however the measurements of the actual wound sites are a lot smaller. Everything looks reasonably healthy here. We have been using Iodoflex He was approved for prime matrix but I have elected to delay this given the improvement in the surface area. Hopefully I will  not regret that decision as were getting close to the end of the year in terms of insurance payment 12/8; 2-week follow-up. Wounds are generally smaller in size. These were initially substantial wounds extending into the forefoot all the way into the heel on the bilateral plantar feet. They are now both located on the plantar heel distal aspect both of these have a lot of callus around the wounds I used a #5 curette to remove this on the right and the left also some subcutaneous debris to try and get the wound edges were using Iodoflex. He has heel offloading shoe 12/22; 2-week follow-up. Not really much improvement. He has thick callus around the outer edges of both wounds. I remove this there is some nonviable subcutaneous tissue as well. We have been using Iodoflex. Her intake nurse and myself spontaneously thought of a total contact cast I went back in May. At that time we really were not seeing much of an improvement with a cast although the wound was in a much different situation I would like to retry this in 2 weeks and I discussed this with the patient 08/12/2021; the patient has had some improvement with the Iodoflex. The the area on the left heel plantar more improved than the right. I had to put him in a total contact cast on the left although I decided to put that off for 2 weeks. I am going to change his primary dressing to silver collagen. I think in both areas he has had some improvement most of the healing seems to be more proximal in the heel. The wounds are in the mid aspect. A lot of thick callus on the right heel however. 1/19; we are using silver collagen on both plantar heel areas. He has had some improvement today. The left did not require any debridement. He still had some eschar on the right that was debrided but both seem to have contracted. I did not put it total contact cast on him today 2/2 we have been using silver collagen. The area on the right plantar heel has areas that  appear to be epithelialized interspersed with dry  flaking callus and dry skin. I removed this. This really looks better than on the other side. On the left still a large area with raised edges and debris on the surface. 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 He has no issues or complaints today. 2/9; not much change in the left foot wound with 1 week of a cast we are using silver collagen. Silver collagen on the right side. The right side has been the better wound surface. We will reapply the total contact cast on the left 2/16; not much improvement on either side I been using silver collagen with a total contact cast on the left. I'm changing the Hydrofera Blue still with a total contact cast on the left 2/23; some improvement on both sides. Disappointing that he has thick callus around the area that we are putting in a total contact cast on the left. We've been using Hydrofera Blue on both wound areas. This is a man who at essentially pressure ulcers in addition to ischemia caused by medications to support his blood pressure (pressors) in the ICU. He was being ventilated in the standing position for severe Covid. A Shiley the wounds extended across his entire foot but are now localized to his plantar heels bilaterally. We have made progress however neither areas healed. I continue to think the total contact cast is helped albeit painstakingly slowly. He has never wanted a plastic surgery consult although I don't know that they would be interested in grafting in area in this location. 10/07/2021: Continued improvement bilaterally. There is still some callus around the left wound, despite the total contact cast. He has some increased pain in his right midfoot around 1 particular area. This has been painful in the past but seems to be a little bit worse. When his cast  was removed today, there was an area on the heel of the left foot that looks a bit macerated. He is also complaining of pain in his left thigh and hip which he thinks is secondary to the limb length discrepancy caused by the cast. 10/14/2021: He continues to improve. A little bit less callus around the left wound. He continues to endorse pain in his right midfoot, but this is not as significant as it was last week. The maceration on his left heel is improved. 10/21/2021: Continued improvement to both wounds. The maceration on his left heel is no longer evident. Less callus bilaterally. Epithelialization progressing. 10/28/2021: Significant improvement this week. The right sided wound is nearly closed with just a small open area at the middle. No maceration seen on the left heel. Continued epithelialization on both sides. No concern for infection. 11/04/2021: T oday, the wounds were measured a little bit differently and come out as larger, but I actually think they are about the same to potentially even smaller, particularly on the left. He continues to accumulate some callus on the right. 11/11/2021: T oday, the patient is expressing some concern that the left wound, despite being in the total contact cast, is not progressing at the same rate as the 1 on the right. He is interested in trying a week without the cast to see how the wound does. The wounds are roughly the same size as last week, with the right perhaps being a little bit smaller. He continues to build up callus on both sites. 11/18/2021: Last  week, I permitted the patient to go without his total contact cast, just to see if the cast was really making any difference. Today, both wounds [name], [name] ([id number]) [id number] Page 11 of 16 have deteriorated to some extent, suggesting that the cast is providing benefit, at least on the left. Both are larger and have accumulated callus, slough, and other  debris. 11/26/2021: I debrided both wounds quite aggressively last week in an effort to stimulate the healing cascade. This appears to have been effective as the left sided wound is a full centimeter shorter in length. Although the right was measured slightly larger, on inspection, it looks as though an area of epithelialized tissue was included in the measurements. We have been using PolyMem Ag on the wound surfaces with a total contact cast to the left. 12/02/2021: It appears that the intake personnel are including epithelialized tissue in his wound measurements; the right wound is almost completely epithelialized; there is just a crater at the proximal midfoot with some open areas. On the left, he has built up some callus, but the overall wound surface looks good. There is some senescent skin around the wound margin. He has been in PolyMem Ag bilaterally with a total contact cast on the left. 12/09/2021: The right wound is nearly closed; there is just a small open area at the mid calcaneus. On the left, the wound is smaller with minimal callus buildup. No significant drainage. 12/16/2021: The right calcaneal wound remains minimally open at the mid calcaneus; the rest has epithelialized. On the left, the wound is also a little bit smaller. There is some senescent tissue on the periphery. He is getting his first application of a trial skin substitute called Vendaje today. 12/23/2021: The wound on his right calcaneus is nearly closed; there is just a small area at the most distal aspect of the calcaneus that is open. On the left, the area where we applied to the skin substitute has a healthier-looking bed of granulation tissue. The wound dimensions are not significantly different on this side but the wound surface is improved. 12/30/2021: The wound on the right calcaneus has not changed significantly aside from some accumulation of callus. On the left, the open area is smaller and continues to have an  improved surface. He continues to accumulate callus around the wound. He is here for his third application of Vendaje. 01/06/2022: The right calcaneal wound is down to just a couple of millimeters. He continues to accumulate periwound callus. He unfortunately got his cast wet earlier in the week and his left foot is macerated, resulting in some superficial skin loss just distal to the open wound. The open wound itself, however, is much smaller and has a healthier appearing surface. He is here for his fourth application of Vendaje. 01/13/2022: The right calcaneal wound is about the same. Unfortunately, once again, his cast got wet and his foot is again macerated. This is caused the left calcaneal wound to enlarge. He is here for his fifth application of Vendaje. 01/20/2022: The right calcaneal wound is very small. There is some periwound callus accumulation. He purchased a new cast protector last week and this has been effective in avoiding the maceration that has been occurring on the left. The left calcaneal wound is narrower and has a healthy and viable-appearing surface. He is here for his 6 application of Vendaje. 01/27/2022: The right calcaneal wound is down to just a pinhole. There is some periwound slough and callus. On the left, the  wound is narrower and shorter by about a centimeter. The surface is robust and viable-appearing. Unfortunately, the rep for the trial skin substitute product did not provide any for [place] to use today. 02/04/2022: The right calcaneal wound remains unchanged. There is more accumulated callus. On the left, although the intake nurse measured it a little bit longer, it looks about the same to me. The surface has a layer of slough, but underneath this, there is good granulation tissue. 02/10/2022: The right calcaneus wound is nearly closed. There is still some callus that builds up around the site. The left side looks about the same in terms of dimensions, but the surface is more  robust and vital-appearing. 02/16/2022: The area of the right calcaneus that was nearly closed last week has closed, but there is a small opening at the mid foot where it looks like some moisture got retained and caused some reopening. The left foot wound is narrower and shallower. Both sites have a fair amount of periwound callus and eschar. 02/24/2022: The small midfoot opening on the right calcaneus is a little bit smaller today. The left foot wound is narrower and shallower. He continues to accumulate periwound callus. No concern for infection. 03/01/2022: The patient came to clinic early because he showered and got his cast wet. Fortunately, there is no significant maceration to his foot but the callus softened and it looks like the wound on his left calcaneus may be a little bit wider. The wound on his right calcaneus is just a narrow slit. Continued accumulation of periwound callus bilaterally. 03/08/2022: The wound on his right calcaneus is very nearly closed, just a small pinpoint opening under a bit of eschar; the left wound has come in quite a bit, as well. It is narrower and shorter than at our last visit. Still with accumulated callus and eschar bilaterally. 03/17/2022: The right calcaneal wound is healed. The left wound is smaller and the surface itself is very clean, but there is some blue-green staining on the periwound callus, concerning for Pseudomonas aeruginosa. 03/23/2022: The right calcaneal wound remains closed. The left wound continues to contract. No further blue-green staining. Small amount of callus and slough accumulation. 03/28/2022: He came in early today because he had gotten his cast wet. On inspection, the wound itself did not get wet or macerated, just a little bit of the forefoot. The wound itself is basically unchanged. 04/07/2022: The right foot wound remains closed. The left wound is the smallest that I have seen it to date. It is narrower and shorter. It still continues  to accumulate slough on the surface. 04/15/2022: There is a band of epithelium now dividing the small left plantar foot wound in 2. There is still some slough on the surface. 04/21/2022: The wound continues to narrow. Just a little bit of slough on the surface. He seems to be responding well to endoform. 04/28/2022: Continued slow contraction of the wound. There is a little slough on the surface and some periwound callus. We have been using endoform and total contact cast. 05/05/2022: The wound appears to have stalled. There is slough and some periwound eschar/callus. No concern for infection, however. 05/12/2022: Unfortunately, his right foot has reopened. It is located at the most posterior aspect of his surgical incision. The area was noted to have drainage coming from it when his padding was removed today. Underneath some callus and senescent skin, there is an opening. No purulent drainage or malodor. On the left foot, the wound is again  unchanged. There is some light blue staining on the callus, but no malodor or purulent drainage. 10/13; right and left heel remanence of extensive plantar foot wounds. These are better than I remember by quite a big margin however he is still left with wounds on the left plantar heel and the right plantar heel. Been using endoform bilaterally. A culture was done that showed apparently Pseudomonas but we are still waiting for the [facility] antibiotic to use gentamicin today. He is still very active by description I am not sure about the offloading of his noncasted right foot 10/20; both wounds right and left heel debrided not much change from last week. [employer] has arrived which is linezolid, gentamicin and ciprofloxacin we will use this with endoform. T contact cast on the left otal 06/02/2022: Both wounds are smaller today. There is still a fair amount of callus buildup around the right foot ulcer. The left is more superficial and nearly flush with the surrounding  tissues. Also with slough and eschar buildup. [name], [name] ([id number]) [id number] Page 12 of 16 Patient History Information obtained from Patient. Family History Cancer - Maternal Grandparents, Diabetes - Father,Paternal Grandparents, Heart Disease - Maternal Grandparents, Hypertension - Father,Paternal Grandparents, Lung Disease - Siblings, No family history of Hereditary Spherocytosis, Kidney Disease, Seizures, Stroke, Thyroid Problems, Tuberculosis. Social History Never smoker, Marital Status - Married, Alcohol Use - Never, Drug Use - No History, Caffeine Use - Daily - tea, soda. Medical History Eyes Denies history of Cataracts, Glaucoma, Optic Neuritis Ear/Nose/Mouth/Throat Denies history of Chronic sinus problems/congestion, Middle ear problems Hematologic/Lymphatic Denies history of Anemia, Hemophilia, Human Immunodeficiency Virus, Lymphedema, Sickle Cell Disease Respiratory Patient has history of Asthma Denies history of Aspiration, Chronic Obstructive Pulmonary Disease (COPD), Pneumothorax, Sleep Apnea, Tuberculosis Cardiovascular Patient has history of Angina - with COVID, Hypertension Denies history of Arrhythmia, Congestive Heart Failure, Coronary Artery Disease, Deep Vein Thrombosis, Hypotension, Myocardial Infarction, Peripheral Arterial Disease, Peripheral Venous Disease, Phlebitis, Vasculitis Gastrointestinal Denies history of Cirrhosis , Colitis, Crohnoos, Hepatitis A, Hepatitis B, Hepatitis C Endocrine Denies history of Type I Diabetes, Type II Diabetes Genitourinary Denies history of End Stage Renal Disease Immunological Denies history of Lupus Erythematosus, Raynaudoos, Scleroderma Integumentary (Skin) Denies history of History of Burn Musculoskeletal Denies history of Gout, Rheumatoid Arthritis, Osteoarthritis, Osteomyelitis Neurologic Denies history of Dementia, Neuropathy, Quadriplegia, Paraplegia, Seizure  Disorder Oncologic Denies history of Received Chemotherapy, Received Radiation Psychiatric Denies history of Anorexia/bulimia, Confinement Anxiety Hospitalization/Surgery History - COVID PNA 07/22/2019- 11/14/2019. - 03/27/2020 wound debridement/ skin graft. Medical A Surgical History Notes nd Constitutional Symptoms (General Health) COVID PNA 07/22/2019-11/14/2019 VENT ECMO, foot drop left foot , Genitourinary kidney stone Psychiatric anxiety Objective Constitutional No acute distress.Marland Kitchen Respiratory Normal work of breathing on room air.. General Notes: 06/02/2022: Both wounds are smaller today. There is still a fair amount of callus buildup around the right foot ulcer. The left is more superficial and nearly flush with the surrounding tissues. Also with slough and eschar buildup. Integumentary (Hair, Skin) Wound #1 status is Open. Original cause of wound was Pressure Injury. The date acquired was: 10/07/2019. The wound has been in treatment 98 weeks. The wound is located on the Right Calcaneus. The wound measures 0.1cm length x 0.1cm width x 0.1cm depth; 0.008cm^2 area and 0.001cm^3 volume. There is Fat Layer (Subcutaneous Tissue) exposed. There is no tunneling or undermining noted. There is a medium amount of serosanguineous drainage noted. The wound margin is thickened. There is large (67-100%) pink granulation within the wound bed. There  is a small (1-33%) amount of necrotic tissue within the wound bed including Adherent Slough. The periwound skin appearance had no abnormalities noted for moisture. The periwound skin appearance had no abnormalities noted for color. The periwound skin appearance exhibited: Callus. Periwound temperature was noted as No Abnormality. Wound #2 status is Open. Original cause of wound was Pressure Injury. The date acquired was: 10/07/2019. The wound has been in treatment 98 weeks. The wound is located on the Left Calcaneus. The wound measures 1cm length x 1.5cm  width x 0.2cm depth; 1.178cm^2 area and 0.236cm^3 volume. There is Fat Layer (Subcutaneous Tissue) exposed. There is no tunneling or undermining noted. There is a medium amount of serosanguineous drainage noted. The wound margin is thickened. There is large (67-100%) red granulation within the wound bed. There is a small (1-33%) amount of necrotic tissue within the wound bed including Eschar and Adherent Slough. The periwound skin appearance had no abnormalities noted for color. The periwound skin appearance exhibited: Scarring, [name], [name] ([id number]) [id number] Page 13 of 16 Maceration. Periwound temperature was noted as No Abnormality. Assessment Active Problems ICD-10 Non-pressure chronic ulcer of other part of right foot with fat layer exposed Non-pressure chronic ulcer of other part of left foot with fat layer exposed Procedures Wound #1 Pre-procedure diagnosis of Wound #1 is a Pressure Ulcer located on the Right Calcaneus . There was a Selective/Open Wound Non-Viable Tissue Debridement with a total area of 0.01 sq cm performed by Fredirick Maudlin, MD. With the following instrument(s): Curette to remove Non-Viable tissue/material. Material removed includes Callus and Slough and after achieving pain control using Lidocaine 5% topical ointment. No specimens were taken. A time out was conducted at 10:12, prior to the start of the procedure. A Minimum amount of bleeding was controlled with Pressure. The procedure was tolerated well with a pain level of 0 throughout and a pain level of 0 following the procedure. Post Debridement Measurements: 0.1cm length x 0.1cm width x 0.1cm depth; 0.001cm^3 volume. Post debridement Stage noted as Category/Stage III. Character of Wound/Ulcer Post Debridement is improved. Post procedure Diagnosis Wound #1: Same as Pre-Procedure General Notes: Scribed for Dr. [name] by J.Scotton. Wound #2 Pre-procedure diagnosis of Wound #2 is  a Pressure Ulcer located on the Left Calcaneus . There was a Selective/Open Wound Non-Viable Tissue Debridement with a total area of 1.5 sq cm performed by Fredirick Maudlin, MD. With the following instrument(s): Curette to remove Non-Viable tissue/material. Material removed includes Callus and Slough and after achieving pain control using Lidocaine 5% topical ointment. No specimens were taken. A time out was conducted at 10:12, prior to the start of the procedure. A Minimum amount of bleeding was controlled with Pressure. The procedure was tolerated well with a pain level of 0 throughout and a pain level of 0 following the procedure. Post Debridement Measurements: 1cm length x 1.5cm width x 0.2cm depth; 0.236cm^3 volume. Post debridement Stage noted as Category/Stage III. Character of Wound/Ulcer Post Debridement is improved. Post procedure Diagnosis Wound #2: Same as Pre-Procedure General Notes: Scribed for Dr. [name] by J.Scotton. Pre-procedure diagnosis of Wound #2 is a Pressure Ulcer located on the Left Calcaneus . There was a T Contact Cast Procedure by Fredirick Maudlin, MD. otal Post procedure Diagnosis Wound #2: Same as Pre-Procedure Notes: Scribed for Dr. [name] by J.Scotton. Plan 06/02/2022: Both wounds are smaller today. There is still a fair amount of callus buildup around the right foot ulcer. The left is more superficial and nearly flush with the  surrounding tissues. Also with slough and eschar buildup. I used a curette to debride the callus and eschar from the right foot. I cleaned up the left foot. We will continue to apply his [place] topical compounded antibiotic with endoform. A total contact cast was applied on the left. Follow-up in 1 week. Electronic Signature(s) Signed: 06/02/2022 10:39:14 AM By: Fredirick Maudlin MD FACS Entered By: Fredirick Maudlin on 06/02/2022 10:39:14 -------------------------------------------------------------------------------- HxROS  Details Patient Name: Date of Service: [name], Garibaldi 06/02/2022 9:30 A M Medical Record Number: [id number] Patient Account Number: [id number] Date of Birth/Sex: Treating RN: February 19, 1976 (46 y.o. M) Primary Care Provider: [name] Other Clinician: Referring Provider: Treating Provider/Extender: [name] in Treatment: [address] ([id number]) [id number] Page 14 of 16 Information Obtained From Patient Constitutional Symptoms (General Health) Medical History: Past Medical History Notes: COVID PNA 07/22/2019-11/14/2019 [facility] ECMO, foot drop left foot , Eyes Medical History: Negative for: Cataracts; Glaucoma; Optic Neuritis Ear/Nose/Mouth/Throat Medical History: Negative for: Chronic sinus problems/congestion; Middle ear problems Hematologic/Lymphatic Medical History: Negative for: Anemia; Hemophilia; Human Immunodeficiency Virus; Lymphedema; Sickle Cell Disease Respiratory Medical History: Positive for: Asthma Negative for: Aspiration; Chronic Obstructive Pulmonary Disease (COPD); Pneumothorax; Sleep Apnea; Tuberculosis Cardiovascular Medical History: Positive for: Angina - with COVID; Hypertension Negative for: Arrhythmia; Congestive Heart Failure; Coronary Artery Disease; Deep Vein Thrombosis; Hypotension; Myocardial Infarction; Peripheral Arterial Disease; Peripheral Venous Disease; Phlebitis; Vasculitis Gastrointestinal Medical History: Negative for: Cirrhosis ; Colitis; Crohns; Hepatitis A; Hepatitis B; Hepatitis C Endocrine Medical History: Negative for: Type I Diabetes; Type II Diabetes Genitourinary Medical History: Negative for: End Stage Renal Disease Past Medical History Notes: kidney stone Immunological Medical History: Negative for: Lupus Erythematosus; Raynauds; Scleroderma Integumentary (Skin) Medical History: Negative for: History of Burn Musculoskeletal Medical History: Negative for:  Gout; Rheumatoid Arthritis; Osteoarthritis; Osteomyelitis Neurologic Medical History: Negative for: Dementia; Neuropathy; Quadriplegia; Paraplegia; Seizure Disorder Oncologic Medical History: Negative for: Received Chemotherapy; Received Radiation DAMARKO, MAIO ([id number]) [id number] Page 15 of 16 Psychiatric Medical History: Negative for: Anorexia/bulimia; Confinement Anxiety Past Medical History Notes: anxiety Immunizations Pneumococcal Vaccine: Received Pneumococcal Vaccination: No Implantable Devices None Hospitalization / Surgery History Type of Hospitalization/Surgery COVID PNA 07/22/2019- 11/14/2019 03/27/2020 wound debridement/ skin graft Family and Social History Cancer: Yes - Maternal Grandparents; Diabetes: Yes - Father,Paternal Grandparents; Heart Disease: Yes - Maternal Grandparents; Hereditary Spherocytosis: No; Hypertension: Yes - Father,Paternal Grandparents; Kidney Disease: No; Lung Disease: Yes - Siblings; Seizures: No; Stroke: No; Thyroid Problems: No; Tuberculosis: No; Never smoker; Marital Status - Married; Alcohol Use: Never; Drug Use: No History; Caffeine Use: Daily - tea, soda; Financial Concerns: No; Food, Clothing or Shelter Needs: No; Support System Lacking: No; Transportation Concerns: No Electronic Signature(s) Signed: 06/02/2022 10:39:50 AM By: Fredirick Maudlin MD FACS Entered By: Fredirick Maudlin on 06/02/2022 10:38:06 -------------------------------------------------------------------------------- Total Contact Cast Details Patient Name: Date of Service: [name], [place] 06/02/2022 9:30 A M Medical Record Number: [id number] Patient Account Number: [id number] Date of Birth/Sex: Treating RN: 1976/02/14 (46 y.o. [name] Primary Care Provider: [name] Other Clinician: Referring Provider: Treating Provider/Extender: [name] in Treatment: 26 T Contact Cast Applied for Wound  Assessment: otal Wound #2 Left Calcaneus Performed By: Physician Fredirick Maudlin, MD Post Procedure Diagnosis Same as Pre-procedure Notes Scribed for Dr. [name] by J.Scotton Electronic Signature(s) Signed: 06/02/2022 10:39:50 AM By: Fredirick Maudlin MD FACS Signed: 06/02/2022 11:56:39 AM By: [name] Entered By: [name] on 06/02/2022 10:19:14 -------------------------------------------------------------------------------- SuperBill Details Patient Name: Date  of Service: [name], TO [place] 06/02/2022 Medical Record Number: [id number] Patient Account Number: [id number] Date of Birth/Sex: Treating RN: Jul 15, 1976 (46 y.o. M) Primary Care Provider: [name] Other Clinician: Referring Provider: Treating Provider/Extender: Bubber, Guevarra ([id number]) [id number] Page 16 of 16 Weeks in Treatment: 98 Diagnosis Coding ICD-10 Codes Code Description [id number] Non-pressure chronic ulcer of other part of right foot with fat layer exposed L97.522 Non-pressure chronic ulcer of other part of left foot with fat layer exposed Facility Procedures : CPT4 Code: [id number] Description: [id number] - DEBRIDE WOUND 1ST 20 SQ CM OR < ICD-10 Diagnosis Description L97.512 Non-pressure chronic ulcer of other part of right foot with fat layer exposed Modifier: Quantity: 1 : CPT4 Code: [id number] Description: [id number] - APPLY TOTAL CONTACT LEG CAST ICD-10 Diagnosis Description L97.522 Non-pressure chronic ulcer of other part of left foot with fat layer exposed Modifier: Quantity: 1 Physician Procedures : CPT4 Code Description Modifier [id number] - WC PHYS LEVEL 4 - EST PT 25 ICD-10 Diagnosis Description L97.512 Non-pressure chronic ulcer of other part of right foot with fat layer exposed L97.522 Non-pressure chronic ulcer of other part of left foot  with fat layer exposed Quantity: 1 : [id number] - WC PHYS DEBR WO ANESTH 20 SQ CM ICD-10  Diagnosis Description L97.512 Non-pressure chronic ulcer of other part of right foot with fat layer exposed Quantity: 1 : [id number] - WC PHYS APPLY TOTAL CONTACT CAST ICD-10 Diagnosis Description L97.522 Non-pressure chronic ulcer of other part of left foot with fat layer exposed Quantity: 1 Electronic Signature(s) Signed: 06/02/2022 10:39:38 AM By: Fredirick Maudlin MD FACS Entered By: Fredirick Maudlin on 06/02/2022 10:39:38

## 2022-06-07 ENCOUNTER — Encounter (INDEPENDENT_AMBULATORY_CARE_PROVIDER_SITE_OTHER): Payer: Self-pay

## 2022-06-10 ENCOUNTER — Encounter (HOSPITAL_BASED_OUTPATIENT_CLINIC_OR_DEPARTMENT_OTHER): Payer: PPO | Attending: General Surgery | Admitting: General Surgery

## 2022-06-10 DIAGNOSIS — Z8616 Personal history of COVID-19: Secondary | ICD-10-CM | POA: Diagnosis not present

## 2022-06-10 DIAGNOSIS — L89623 Pressure ulcer of left heel, stage 3: Secondary | ICD-10-CM | POA: Diagnosis not present

## 2022-06-10 DIAGNOSIS — L97512 Non-pressure chronic ulcer of other part of right foot with fat layer exposed: Secondary | ICD-10-CM | POA: Insufficient documentation

## 2022-06-10 DIAGNOSIS — I10 Essential (primary) hypertension: Secondary | ICD-10-CM | POA: Insufficient documentation

## 2022-06-10 DIAGNOSIS — L89613 Pressure ulcer of right heel, stage 3: Secondary | ICD-10-CM | POA: Diagnosis not present

## 2022-06-10 DIAGNOSIS — L97522 Non-pressure chronic ulcer of other part of left foot with fat layer exposed: Secondary | ICD-10-CM | POA: Insufficient documentation

## 2022-06-10 NOTE — Progress Notes (Signed)
[name], HUSMANN ([id number]) [id number] Page 1 of 9 Visit Report for 06/10/2022 Arrival Information Details Patient Name: Date of Service: [name], TO [name] E. 06/10/2022 10:15 A M Medical Record Number: [id number] Patient Account Number: [id number] Date of Birth/Sex: Treating RN: 1975/10/13 (46 y.o. M) Primary Care [name]: Dorinda Hill Other Clinician: Referring [name]: Treating Aws Shere/Extender: Jana Hakim in Treatment: 41 Visit Information History Since Last Visit All ordered tests and consults were completed: No Patient Arrived: Wheel Chair Added or deleted any medications: No Arrival Time: 09:50 Any new allergies or adverse reactions: No Accompanied By: wife Had a fall or experienced change in No Transfer Assistance: None activities of daily living that may affect Patient Identification Verified: Yes risk of falls: Secondary Verification Process Completed: Yes Signs or symptoms of abuse/neglect since last visito No Patient Requires Transmission-Based Precautions: No Hospitalized since last visit: No Patient Has Alerts: No Implantable device outside of the clinic excluding No cellular tissue based products placed in the center since last visit: Pain Present Now: Yes Electronic Signature(s) Signed: 06/10/2022 10:11:47 AM By: Dayton Scrape Entered By: Dayton Scrape on 06/10/2022 09:51:26 -------------------------------------------------------------------------------- Encounter Discharge Information Details Patient Name: Date of Service: [name], TO [name] E. 06/10/2022 10:15 A M Medical Record Number: [id number] Patient Account Number: [id number] Date of Birth/Sex: Treating RN: November 21, 1975 (46 y.o. [name] Primary Care Brady Plant: Dorinda Hill Other Clinician: Referring Rosabell Geyer: Treating [name]/Extender: Jana Hakim in Treatment: 19 Encounter Discharge Information Items Post Procedure  Vitals Discharge Condition: Stable Temperature (F): 98.1 Ambulatory Status: Wheelchair Pulse (bpm): 92 Discharge Destination: Home Respiratory Rate (breaths/min): 20 Transportation: Private Auto Blood Pressure (mmHg): 151/84 Accompanied By: wife Schedule Follow-up Appointment: Yes Clinical Summary of Care: Electronic Signature(s) Signed: 06/13/2022 4:40:13 PM By: [name] Ard RN Entered By: [name] on 06/10/2022 11:42:47 [name] ([id number]) [id number] Page 2 of 9 -------------------------------------------------------------------------------- Lower Extremity Assessment Details Patient Name: Date of Service: [name] 06/10/2022 10:15 A M Medical Record Number: [id number] Patient Account Number: [id number] Date of Birth/Sex: Treating RN: [birth year] (46 y.o. [name] Primary Care Metzli Pollick: Dorinda Hill Other Clinician: Referring [name]: Treating Auguste Tebbetts/Extender: Jana Hakim in Treatment: 99 Edema Assessment Assessed: [name]: No] [Right: No] Edema: [Left: N] [Right: o] Calf Left: Right: Point of Measurement: 29 cm From Medial Instep 41.7 cm 44 cm Ankle Left: Right: Point of Measurement: 9 cm From Medial Instep 24.8 cm 24.6 cm Vascular Assessment Pulses: Dorsalis Pedis Palpable: [Left:Yes] [Right:Yes] Electronic Signature(s) Signed: 06/10/2022 3:35:32 PM By: [name] Entered By: [name] on 06/10/2022 10:41:31 -------------------------------------------------------------------------------- Multi Wound Chart Details Patient Name: Date of Service: [name], [name] 06/10/2022 10:15 A M Medical Record Number: [id number] Patient Account Number: [id number] Date of Birth/Sex: Treating RN: 04-04-1976 (46 y.o. M) Primary Care [name]: Dorinda Hill Other Clinician: Referring [name]: Treating [name]/Extender: Jana Hakim in Treatment: 99 Vital  Signs Height(in): 69 Pulse(bpm): 92 Weight(lbs): 280 Blood Pressure(mmHg): 151/84 Body Mass Index(BMI): 41.3 Temperature(F): 98.1 Respiratory Rate(breaths/min): 20 [1:Photos:] [N/A:N/A [id number] Page 3 of 9] Right Calcaneus Left Calcaneus N/A Wound Location: Pressure Injury Pressure Injury N/A Wounding Event: Pressure Ulcer Pressure Ulcer N/A Primary Etiology: Asthma, Angina, Hypertension Asthma, Angina, Hypertension N/A Comorbid History: 10/07/2019 10/07/2019 N/A Date Acquired: 29 99 N/A Weeks of Treatment: Open Open N/A Wound Status: No No N/A Wound Recurrence: 0.1x0.1x0.1 3.5x1.4x0.2 N/A Measurements L x W x D (cm) 0.008 3.848 N/A A (cm) : rea 0.001 0.77 N/A  Volume (cm) : 100.00% 85.80% N/A % Reduction in A rea: 100.00% 97.20% N/A % Reduction in Volume: Category/Stage III Category/Stage III N/A Classification: Medium Medium N/A Exudate A mount: Serosanguineous Serosanguineous N/A Exudate Type: red, brown red, brown N/A Exudate Color: Thickened Thickened N/A Wound Margin: Small (1-33%) Large (67-100%) N/A Granulation A mount: Pink Red N/A Granulation Quality: Large (67-100%) Small (1-33%) N/A Necrotic A mount: Eschar Eschar, Adherent Slough N/A Necrotic Tissue: Fat Layer (Subcutaneous Tissue): Yes Fat Layer (Subcutaneous Tissue): Yes N/A Exposed Structures: Fascia: No Fascia: No Tendon: No Tendon: No Muscle: No Muscle: No Joint: No Joint: No Bone: No Bone: No Large (67-100%) Small (1-33%) N/A Epithelialization: Debridement - Selective/Open Wound Debridement - Selective/Open Wound N/A Debridement: Pre-procedure Verification/Time Out 10:51 10:51 N/A Taken: Lidocaine 4% Topical Solution Lidocaine 4% Topical Solution N/A Pain Control: Necrotic/Eschar Necrotic/Eschar, Callus, Slough N/A Tissue Debrided: Non-Viable Tissue Non-Viable Tissue N/A Level: 0.25 4.9 N/A Debridement A (sq cm): rea Curette Curette  N/A Instrument: Minimum Minimum N/A Bleeding: Pressure Pressure N/A Hemostasis A chieved: Procedure was tolerated well Procedure was tolerated well N/A Debridement Treatment Response: 0.1x0.1x0.1 3.5x1.4x0.2 N/A Post Debridement Measurements L x W x D (cm) 0.001 0.77 N/A Post Debridement Volume: (cm) Category/Stage III Category/Stage III N/A Post Debridement Stage: Callus: Yes Callus: Yes N/A Periwound Skin Texture: Scarring: Yes No Abnormalities Noted Maceration: Yes N/A Periwound Skin Moisture: No Abnormalities Noted No Abnormalities Noted N/A Periwound Skin Color: No Abnormality No Abnormality N/A Temperature: Debridement Debridement N/A Procedures Performed: T Contact Cast otal Treatment Notes Electronic Signature(s) Signed: 06/10/2022 11:00:12 AM By: [name] MD FACS Entered By: [name] on 06/10/2022 11:00:12 -------------------------------------------------------------------------------- Multi-Disciplinary Care Plan Details Patient Name: Date of Service: [name], TO [name] E. 06/10/2022 10:15 A M Medical Record Number: [id number] Patient Account Number: [id number] Date of Birth/Sex: Treating RN: Nov 29, 1975 (46 y.o. [name] Primary Care Destane Speas: Dorinda Hill Other Clinician: Referring Ravneet Spilker: Treating Deonna Krummel/Extender: Jana Hakim in Treatment: [address] Multidisciplinary Care Plan reviewed with physician [address] DUSTEN, ELLINWOOD ([id number]) [id number] Page 4 of 9 Wound/Skin Impairment Nursing Diagnoses: Impaired tissue integrity Knowledge deficit related to ulceration/compromised skin integrity Goals: Patient/caregiver will verbalize understanding of skin care regimen Date Initiated: 07/15/2020 Target Resolution Date: 03/08/2023 Goal Status: Active Ulcer/skin breakdown will have a volume reduction of 30% by week 4 Date Initiated: 07/15/2020 Date Inactivated: 08/20/2020 Target  Resolution Date: 09/03/2020 Goal Status: Unmet Unmet Reason: no major changes. Ulcer/skin breakdown will heal within 14 weeks Date Initiated: 12/04/2020 Date Inactivated: 12/10/2020 Target Resolution Date: 12/10/2020 Unmet Reason: wounds still open at 14 Goal Status: Unmet weeks and today 21 weeks. Interventions: Assess patient/caregiver ability to obtain necessary supplies Assess patient/caregiver ability to perform ulcer/skin care regimen upon admission and as needed Assess ulceration(s) every visit Provide education on ulcer and skin care Treatment Activities: Skin care regimen initiated : 07/15/2020 Topical wound management initiated : 07/15/2020 Notes: Electronic Signature(s) Signed: 06/10/2022 3:35:32 PM By: [name] Entered By: [name] on 06/10/2022 10:50:33 -------------------------------------------------------------------------------- Pain Assessment Details Patient Name: Date of Service: [name], TO [name] E. 06/10/2022 10:15 A M Medical Record Number: [id number] Patient Account Number: [id number] Date of Birth/Sex: Treating RN: 01-15-1976 (46 y.o. M) Primary Care Adelise Buswell: Dorinda Hill Other Clinician: Referring Olympia Adelsberger: Treating Annastyn Silvey/Extender: Jana Hakim in Treatment: 39 Active Problems Location of Pain Severity and Description of Pain Patient Has Paino Yes Site Locations Pain Location: Generalized Pain Rate the pain. Current Pain Level: 3 Worst Pain Level: 10 Least  Pain Level: 0 Tolerable Pain Level: 2 Pain Management and Medication TEGH, FRANEK ([id number]) [id number] Page 5 of 9 Current Pain Management: Electronic Signature(s) Signed: 06/10/2022 10:11:47 AM By: Worthy Rancher Entered By: Worthy Rancher on 06/10/2022 09:52:11 -------------------------------------------------------------------------------- Patient/Caregiver Education Details Patient Name: Date of Service: [name], [place]  11/3/2023andnbsp10:15 [place] Record Number: [id number] Patient Account Number: [id number] Date of Birth/Gender: Treating RN: Oct 19, 1975 (46 y.o. [name] Primary Care Physician: [name] Other Clinician: Referring Physician: Treating Physician/Extender: [name] in Treatment: 50 Education Assessment Education Provided To: Patient Education Topics Provided Wound/Skin Impairment: Methods: Explain/Verbal Responses: Reinforcements needed, State content correctly Electronic Signature(s) Signed: 06/10/2022 3:35:32 PM By: Adline Peals Entered By: Adline Peals on 06/10/2022 10:50:44 -------------------------------------------------------------------------------- Wound Assessment Details Patient Name: Date of Service: [name], [place] 06/10/2022 10:15 A M Medical Record Number: [id number] Patient Account Number: [id number] Date of Birth/Sex: Treating RN: 08/05/1976 (46 y.o. [name] Primary Care Lynnet Hefley: [name] Other Clinician: Referring Chriss Mannan: Treating Sharnika Binney/Extender: [name] in Treatment: 99 Wound Status Wound Number: 1 Primary Etiology: Pressure Ulcer Wound Location: Right Calcaneus Wound Status: Open Wounding Event: Pressure Injury Comorbid History: Asthma, Angina, Hypertension Date Acquired: 10/07/2019 Weeks Of Treatment: 99 Clustered Wound: No Photos KHYRI, HINZMAN ([id number]) [id number] Page 6 of 9 Wound Measurements Length: (cm) 0.1 Width: (cm) 0.1 Depth: (cm) 0.1 Area: (cm) 0.008 Volume: (cm) 0.001 % Reduction in Area: 100% % Reduction in Volume: 100% Epithelialization: Large (67-100%) Tunneling: No Undermining: No Wound Description Classification: Category/Stage III Wound Margin: Thickened Exudate Amount: Medium Exudate Type: Serosanguineous Exudate Color: red, brown Foul Odor After Cleansing: No Slough/Fibrino Yes Wound  Bed Granulation Amount: Small (1-33%) Exposed Structure Granulation Quality: Pink Fascia Exposed: No Necrotic Amount: Large (67-100%) Fat Layer (Subcutaneous Tissue) Exposed: Yes Necrotic Quality: Eschar Tendon Exposed: No Muscle Exposed: No Joint Exposed: No Bone Exposed: No Periwound Skin Texture Texture Color No Abnormalities Noted: No No Abnormalities Noted: Yes Callus: Yes Temperature / Pain Temperature: No Abnormality Moisture No Abnormalities Noted: Yes Treatment Notes Wound #1 (Calcaneus) Wound Laterality: Right Cleanser Normal Saline Discharge Instruction: Cleanse the wound with Normal Saline prior to applying a clean dressing using gauze sponges, not tissue or cotton balls. Wound Cleanser Discharge Instruction: Cleanse the wound with wound cleanser prior to applying a clean dressing using gauze sponges, not tissue or cotton balls. Peri-Wound Care Topical Primary Dressing Endoform 2x2 in Discharge Instruction: Moisten with saline Secondary Dressing ALLEVYN Heel 4 1/2in x 5 1/2in / 10.5cm x 13.5cm Discharge Instruction: Apply over primary dressing as directed. Woven Gauze Sponge, Non-Sterile 4x4 in Discharge Instruction: Apply over primary dressing as directed. Secured With Conforming Stretch Gauze Bandage, Sterile 2x75 (in/in) Discharge Instruction: Secure with stretch gauze as directed. 30M Medipore H Soft Cloth Surgical T ape, 4 x 10 (in/yd) Discharge Instruction: Secure with tape as directed. ALONTE, WULFF ([id number]) [id number] Page 7 of 9 Compression Wrap Compression Stockings Add-Ons Electronic Signature(s) Signed: 06/10/2022 3:35:32 PM By: Adline Peals Entered By: Adline Peals on 06/10/2022 10:45:12 -------------------------------------------------------------------------------- Wound Assessment Details Patient Name: Date of Service: [name], [place] 06/10/2022 10:15 A M Medical Record Number:  [id number] Patient Account Number: [id number] Date of Birth/Sex: Treating RN: [birth year] (46 y.o. [name] Primary Care Sonu Kruckenberg: [name] Other Clinician: Referring Sonam Huelsmann: Treating Tyhir Schwan/Extender: [name] in Treatment: 99 Wound Status Wound Number: 2 Primary Etiology: Pressure Ulcer Wound Location: Left Calcaneus Wound Status: Open  Wounding Event: Pressure Injury Comorbid History: Asthma, Angina, Hypertension Date Acquired: 10/07/2019 Weeks Of Treatment: 99 Clustered Wound: No Photos Wound Measurements Length: (cm) 3.5 Width: (cm) 1.4 Depth: (cm) 0.2 Area: (cm) 3.848 Volume: (cm) 0.77 % Reduction in Area: 85.8% % Reduction in Volume: 97.2% Epithelialization: Small (1-33%) Tunneling: No Undermining: No Wound Description Classification: Category/Stage III Wound Margin: Thickened Exudate Amount: Medium Exudate Type: Serosanguineous Exudate Color: red, brown Foul Odor After Cleansing: No Slough/Fibrino Yes Wound Bed Granulation Amount: Large (67-100%) Exposed Structure Granulation Quality: Red Fascia Exposed: No Necrotic Amount: Small (1-33%) Fat Layer (Subcutaneous Tissue) Exposed: Yes Necrotic Quality: Eschar, Adherent Slough Tendon Exposed: No Muscle Exposed: No Joint Exposed: No Bone Exposed: No [address] JEROLD, YOSS ([id number]) [id number] Page 8 of 9 No Abnormalities Noted: No No Abnormalities Noted: Yes Callus: Yes Temperature / Pain Scarring: Yes Temperature: No Abnormality Moisture No Abnormalities Noted: No Maceration: Yes Treatment Notes Wound #2 (Calcaneus) Wound Laterality: Left Cleanser Normal Saline Discharge Instruction: Cleanse the wound with Normal Saline prior to applying a clean dressing using gauze sponges, not tissue or cotton balls. Wound Cleanser Discharge Instruction: Cleanse the wound with wound cleanser prior to applying a  clean dressing using gauze sponges, not tissue or cotton balls. Peri-Wound Care Topical TCC Discharge Instruction: to left leg keystone Primary Dressing Endoform 2x2 in Discharge Instruction: Moisten with saline Secondary Dressing Optifoam Non-Adhesive Dressing, 4x4 in Discharge Instruction: Apply over primary dressing as directed. Woven Gauze Sponge, Non-Sterile 4x4 in Discharge Instruction: Apply over primary dressing as directed. Secured With 52M Medipore H Soft Cloth Surgical T ape, 4 x 10 (in/yd) Discharge Instruction: Secure with tape as directed. Compression Wrap Compression Stockings Add-Ons Electronic Signature(s) Signed: 06/10/2022 3:35:32 PM By: [name] Entered By: [name] on 06/10/2022 10:45:29 -------------------------------------------------------------------------------- Vitals Details Patient Name: Date of Service: [name], [name] 06/10/2022 10:15 A M Medical Record Number: [id number] Patient Account Number: [id number] Date of Birth/Sex: Treating RN: 09-Apr-1976 (46 y.o. M) Primary Care Emma Birchler: Dorinda Hill Other Clinician: Referring Leigh Blas: Treating Cebert Dettmann/Extender: Jana Hakim in Treatment: 99 Vital Signs Time Taken: 09:55 Temperature (F): 98.1 Height (in): 69 Pulse (bpm): 92 Weight (lbs): 280 Respiratory Rate (breaths/min): 20 Body Mass Index (BMI): 41.3 Blood Pressure (mmHg): 151/84 Reference Range: 80 - 120 mg / dl Electronic Signature(s) JERIAN, MORAIS ([id number]) [id number] Page 9 of 9 Signed: 06/10/2022 10:11:47 AM By: Dayton Scrape Entered By: Dayton Scrape on 06/10/2022 09:51:51

## 2022-06-14 ENCOUNTER — Encounter: Payer: Self-pay | Admitting: Behavioral Health

## 2022-06-14 ENCOUNTER — Ambulatory Visit (INDEPENDENT_AMBULATORY_CARE_PROVIDER_SITE_OTHER): Payer: PPO | Admitting: Behavioral Health

## 2022-06-14 DIAGNOSIS — F331 Major depressive disorder, recurrent, moderate: Secondary | ICD-10-CM

## 2022-06-14 DIAGNOSIS — F99 Mental disorder, not otherwise specified: Secondary | ICD-10-CM | POA: Diagnosis not present

## 2022-06-14 DIAGNOSIS — F5105 Insomnia due to other mental disorder: Secondary | ICD-10-CM | POA: Diagnosis not present

## 2022-06-14 DIAGNOSIS — F411 Generalized anxiety disorder: Secondary | ICD-10-CM | POA: Diagnosis not present

## 2022-06-14 DIAGNOSIS — F39 Unspecified mood [affective] disorder: Secondary | ICD-10-CM

## 2022-06-14 MED ORDER — LAMOTRIGINE 100 MG PO TABS: 150.0000 mg | ORAL_TABLET | Freq: Every day | ORAL | 3 refills | Status: DC

## 2022-06-14 MED ORDER — TRAZODONE HCL 100 MG PO TABS: 200.0000 mg | ORAL_TABLET | Freq: Every day | ORAL | 3 refills | Status: DC

## 2022-06-14 MED ORDER — DULOXETINE HCL 60 MG PO CPEP: ORAL_CAPSULE | ORAL | 1 refills | Status: DC

## 2022-06-14 NOTE — Progress Notes (Signed)
[name] [id number] Dec 04, 1975 46 y.o.  Virtual Visit via Telephone Note  I connected with pt on 06/14/22 at  9:30 AM EST by telephone and verified that I am speaking with the correct person using two identifiers.   I discussed the limitations, risks, security and privacy concerns of performing an evaluation and management service by telephone and the availability of in person appointments. I also discussed with the patient that there may be a patient responsible charge related to this service. The patient expressed understanding and agreed to proceed.   I discussed the assessment and treatment plan with the patient. The patient was provided an opportunity to ask questions and all were answered. The patient agreed with the plan and demonstrated an understanding of the instructions.   The patient was advised to call back or seek an in-person evaluation if the symptoms worsen or if the condition fails to improve as anticipated.  I provided 30 minutes of non-face-to-face time during this encounter.  The patient was located at home.  The provider was located at [place].   Elwanda Brooklyn, NP   Subjective:   Patient ID:  [name] is a 46 y.o. (DOB 1976/06/08) male.  Chief Complaint:  Chief Complaint  Patient presents with   Anxiety   Depression   Follow-up   Medication Refill   Patient Education    HPI 46 year old male presents to this office via telephonic interview for follow up and medicaiton management. Says that his depression and anxiety have improved moderately. Says he stopped taking the Auvelity completely but is "doing Ok right now". Says it was giving him dry mouth.  He does not want to make any other med changes this visit.  He says his depression is 5/10 and anxiety is 4/10. He is sleeping 7-8 hours per night. He would like to adjust or try another medication to help.  Denies mania, no psychosis, no current SI. No HI.    Prior psychiatric medication  trials: Auvelity- dry mouth   Review of Systems:  Review of Systems  Constitutional: Negative.   Allergic/Immunologic: Negative.   Psychiatric/Behavioral: Negative.      Medications: I have reviewed the patient's current medications.  Current Outpatient Medications  Medication Sig Dispense Refill   albuterol (VENTOLIN HFA) 108 (90 Base) MCG/ACT inhaler INHALE 2 PUFFS BY MOUTH EVERY 4 HOURS AS NEEDED FOR WHEEZE OR FOR SHORTNESS OF BREATH 18 g 1   Albuterol Sulfate (PROAIR RESPICLICK) 123XX123 (90 Base) MCG/ACT AEPB INHALE 2 PUFF(S) EVERY 4-6 HOURS BY INHALATION ROUTE FOR 30 DAYS.     alum & mag hydroxide-simeth (MAALOX MAX) 400-400-40 MG/5ML suspension      amLODipine (NORVASC) 2.5 MG tablet Take 1 tablet (2.5 mg total) by mouth daily. 30 tablet 5   ARIPiprazole (ABILIFY) 2 MG tablet 1 tablet Orally Once a day at night for 90     budesonide-formoterol (SYMBICORT) 160-4.5 MCG/ACT inhaler INHALE TWO PUFFS TWICE DAILY TO PREVENT COUGH OR WHEEZE. RINSE, GARGLE, AND SPIT AFTER USE.     cyclobenzaprine (FLEXERIL) 10 MG tablet      cyclobenzaprine (FLEXERIL) 5 MG tablet      diclofenac (VOLTAREN) 50 MG EC tablet      DULoxetine (CYMBALTA) 60 MG capsule 1 capsule Orally Once a day for 90 90 capsule 1   DULoxetine HCl 40 MG CPEP TAKE TWO CAPSULES AT ONCE DAILY IN THE MORNING AFTER BREAKFAST. 180 capsule 0   ergocalciferol (VITAMIN D2) 1.25 MG (50000  UT) capsule      ibuprofen (ADVIL) 200 MG tablet Take 1 tablet every 6 hours by oral route.     lamoTRIgine (LAMICTAL) 100 MG tablet Take 1.5 tablets (150 mg total) by mouth daily. 30 tablet 3   lamoTRIgine (LAMICTAL) 25 MG tablet TAKE TWO TABLETS BY MOUTH AT ONCE DAILY. 180 tablet 0   metoprolol tartrate (LOPRESSOR) 25 MG tablet TAKE 1 TABLET BY MOUTH TWICE A DAY 180 tablet 3   montelukast (SINGULAIR) 10 MG tablet TAKE 1 TABLET BY MOUTH EVERY DAY 90 tablet 1   oxyCODONE (ROXICODONE) 5 MG immediate release tablet Take 1 tablet (5 mg total) by mouth every  6 (six) hours as needed for severe pain (max 2 tabs/day for now). To be refilled 05/19/22- after a 7 day supply- 60 tablet 0   pantoprazole (PROTONIX) 40 MG tablet Take 1 tablet by mouth daily.     traZODone (DESYREL) 100 MG tablet Take 2 tablets (200 mg total) by mouth at bedtime. 180 tablet 3   triamcinolone (NASACORT) 55 MCG/ACT AERO nasal inhaler Place 2 sprays into the nose daily.     No current facility-administered medications for this visit.    Medication Side Effects: None  Allergies: No Known Allergies  Past Medical History:  Diagnosis Date   Anginal pain ([place])    with covid   Anxiety    Asthma    Dyspnea    GERD (gastroesophageal reflux disease)    Headache    History of kidney stones    LEFT URETERAL STONE   HTN (hypertension)    Pancreatitis 2018   GALLBALDDER SLUDGE CAUSED ISSUED RESOLVED   Pneumonia 07/2019   covid    Family History  Problem Relation Age of Onset   Asthma Brother    Diabetes Father    Hypertension Father    Diabetes Paternal Grandmother    Hypertension Paternal Grandmother    Migraines Mother    GER disease Mother    Pancreatic cancer Maternal Grandmother    Heart disease Maternal Grandfather     Social History   Socioeconomic History   Marital status: Married    Spouse name: Not on file   Number of children: 1   Years of education: Not on file   Highest education level: Not on file  Occupational History   Occupation: delivery driver  Tobacco Use   Smoking status: Never   Smokeless tobacco: Never  Vaping Use   Vaping Use: Never used  Substance and Sexual Activity   Alcohol use: Yes    Comment: rarely 2-3 times a year   Drug use: No   Sexual activity: Yes    Partners: Female  Other Topics Concern   Not on file  Social History Narrative   Not on file   Social Determinants of Health   Financial Resource Strain: Not on file  Food Insecurity: Not on file  Transportation Needs: Not on file  Physical Activity: Not on  file  Stress: Not on file  Social Connections: Not on file  Intimate Partner Violence: Not on file    Past Medical History, Surgical history, Social history, and Family history were reviewed and updated as appropriate.   Please see review of systems for further details on the patient's review from today.   Objective:   Physical Exam:  There were no vitals taken for this visit.  Physical Exam Constitutional:      General: He is not in acute distress.    Appearance:  Normal appearance.  Neurological:     Mental Status: He is alert and oriented to person, place, and time.     Gait: Gait normal.  Psychiatric:        Attention and Perception: Attention and perception normal. He does not perceive auditory or visual hallucinations.        Mood and Affect: Mood and affect normal. Mood is not anxious or depressed. Affect is not labile.        Speech: Speech normal.        Behavior: Behavior normal. Behavior is cooperative.        Thought Content: Thought content normal.        Cognition and Memory: Cognition and memory normal.        Judgment: Judgment normal.     Lab Review:     Component Value Date/Time   NA 140 08/31/2020 0000   K 4.2 08/31/2020 0000   CL 103 08/31/2020 0000   CO2 28 08/31/2020 0000   GLUCOSE 129 (H) 08/31/2020 0000   BUN 20 08/31/2020 0000   CREATININE 1.10 08/31/2020 0000   CALCIUM 9.1 08/31/2020 0000   PROT 7.3 08/31/2020 0000   ALBUMIN 2.9 (L) 12/02/2019 0628   AST 14 08/31/2020 0000   ALT 11 08/31/2020 0000   ALKPHOS 76 12/02/2019 0628   BILITOT 0.3 08/31/2020 0000   GFRNONAA 80 08/31/2020 0000   GFRAA 93 08/31/2020 0000       Component Value Date/Time   WBC 8.5 03/29/2022 1823   WBC 7.7 08/31/2020 0000   RBC 5.74 03/29/2022 1823   RBC 5.66 08/31/2020 0000   HGB 15.8 03/29/2022 1823   HCT 48.8 03/29/2022 1823   PLT 138 (L) 08/31/2020 0000   MCV 85 03/29/2022 1823   MCH 27.5 03/29/2022 1823   MCH 28.3 08/31/2020 0000   MCHC 32.4  03/29/2022 1823   MCHC 33.8 08/31/2020 0000   RDW 12.7 03/29/2022 1823   LYMPHSABS 2.0 03/29/2022 1823   MONOABS 0.7 12/02/2019 0628   EOSABS 0.2 03/29/2022 1823   BASOSABS 0.1 03/29/2022 1823    No results found for: "POCLITH", "LITHIUM"   No results found for: "PHENYTOIN", "PHENOBARB", "VALPROATE", "CBMZ"   .res Assessment: Plan:    RECOMMENDATIONS:    Greater than 50% of 30 min telephonic visit with patient was spent on counseling and coordination of care.  We discussed patients chronic health concerns and his continue wound care for feet. Reporting his anxiety and depression have improved some. Still having some marital problems. He is continuing in psychotherapy.   Discussed medication options, side effects and his goals for tx.  We agreed to: To continue Auvelity 90-210 daily. Continue  Cymbalta to 80 mg daily. Will take two 40 mg capsule at once.  Continue Trazodone 200 mg at bedtime Continue Lamictal to 150 mg daily, patients request.  Will report any side effects or worsening symptoms To follow up in 8 weeks to reassess Provided emergency contact information Reviewed Payne, NP                Albaraa was seen today for anxiety, depression, follow-up, medication refill and patient education.  Diagnoses and all orders for this visit:  Major depressive disorder, recurrent episode, moderate (HCC) -     lamoTRIgine (LAMICTAL) 100 MG tablet; Take 1.5 tablets (150 mg total) by mouth daily. -     DULoxetine (CYMBALTA) 60 MG capsule; 1 capsule Orally Once a day  for 90  Generalized anxiety disorder -     lamoTRIgine (LAMICTAL) 100 MG tablet; Take 1.5 tablets (150 mg total) by mouth daily. -     DULoxetine (CYMBALTA) 60 MG capsule; 1 capsule Orally Once a day for 90  Unspecified mood (affective) disorder (HCC) -     lamoTRIgine (LAMICTAL) 100 MG tablet; Take 1.5 tablets (150 mg total) by mouth daily. -     DULoxetine (CYMBALTA) 60 MG capsule; 1 capsule  Orally Once a day for 90  Insomnia due to other mental disorder -     traZODone (DESYREL) 100 MG tablet; Take 2 tablets (200 mg total) by mouth at bedtime.    Please see After Visit Summary for patient specific instructions.  Future Appointments  Date Time Provider [place]  06/16/2022  9:30 AM Fredirick Maudlin, MD [facility]  06/16/2022  1:45 PM Standiford, Nena Alexander, DPM [facility]  06/23/2022  9:30 AM Fredirick Maudlin, MD [facility]  06/29/2022 10:15 AM Fredirick Maudlin, MD [facility]  07/08/2022  9:20 AM Courtney Heys, MD [facility]  09/13/2022  1:00 PM Elwanda Brooklyn, NP [facility] None    No orders of the defined types were placed in this encounter.     -------------------------------

## 2022-06-16 ENCOUNTER — Ambulatory Visit: Payer: PPO | Admitting: Podiatry

## 2022-06-16 ENCOUNTER — Ambulatory Visit (INDEPENDENT_AMBULATORY_CARE_PROVIDER_SITE_OTHER): Payer: PPO

## 2022-06-16 ENCOUNTER — Encounter (HOSPITAL_BASED_OUTPATIENT_CLINIC_OR_DEPARTMENT_OTHER): Payer: PPO | Admitting: General Surgery

## 2022-06-16 DIAGNOSIS — L97422 Non-pressure chronic ulcer of left heel and midfoot with fat layer exposed: Secondary | ICD-10-CM

## 2022-06-16 DIAGNOSIS — L89613 Pressure ulcer of right heel, stage 3: Secondary | ICD-10-CM | POA: Diagnosis not present

## 2022-06-16 DIAGNOSIS — L89623 Pressure ulcer of left heel, stage 3: Secondary | ICD-10-CM | POA: Diagnosis not present

## 2022-06-16 DIAGNOSIS — L97522 Non-pressure chronic ulcer of other part of left foot with fat layer exposed: Secondary | ICD-10-CM

## 2022-06-16 DIAGNOSIS — L97412 Non-pressure chronic ulcer of right heel and midfoot with fat layer exposed: Secondary | ICD-10-CM | POA: Diagnosis not present

## 2022-06-16 DIAGNOSIS — L89620 Pressure ulcer of left heel, unstageable: Secondary | ICD-10-CM | POA: Diagnosis not present

## 2022-06-16 DIAGNOSIS — L97512 Non-pressure chronic ulcer of other part of right foot with fat layer exposed: Secondary | ICD-10-CM | POA: Diagnosis not present

## 2022-06-17 NOTE — Progress Notes (Signed)
TACUMA, GRAFFAM ([id number]) [id number] Page 1 of 16 Visit Report for 06/16/2022 Chief Complaint Document Details Patient Name: Date of Service: [name], [name] [name] 06/16/2022 9:30 A M Medical Record Number: [id number] Patient Account Number: [id number] Date of Birth/Sex: Treating RN: Jun 05, 1976 (46 y.o. M) Primary Care Provider: Dorinda Hill Other Clinician: Referring Provider: Treating Provider/Extender: Jana Hakim in Treatment: 100 Information Obtained from: Patient Chief Complaint Bilateral Plantar Foot Ulcers Electronic Signature(s) Signed: 06/16/2022 10:37:45 AM By: [name] MD FACS Entered By: [name] on 06/16/2022 10:37:44 -------------------------------------------------------------------------------- Debridement Details Patient Name: Date of Service: [name], [name] [place] E. 06/16/2022 9:30 A M Medical Record Number: [id number] Patient Account Number: [id number] Date of Birth/Sex: Treating RN: 05/25/1976 (46 y.o. [name] Primary Care Provider: Dorinda Hill Other Clinician: Referring Provider: Treating Provider/Extender: Jana Hakim in Treatment: 100 Debridement Performed for Assessment: Wound #2 Left Calcaneus Performed By: Physician [name], MD Debridement Type: Debridement Level of Consciousness (Pre-procedure): Awake and Alert Pre-procedure Verification/Time Out Yes - 10:19 Taken: Start Time: 10:19 Pain Control: Lidocaine 5% topical ointment T Area Debrided (L x W): otal 3.5 (cm) x 0.6 (cm) = 2.1 (cm) Tissue and other material debrided: Non-Viable, Callus, Eschar, Slough, Slough Level: Non-Viable Tissue Debridement Description: Selective/Open Wound Instrument: Curette Bleeding: Minimum Hemostasis Achieved: Pressure End Time: 10:23 Procedural Pain: 0 Post Procedural Pain: 0 Response [name] Treatment: Procedure was tolerated well Level of Consciousness (Post- Awake  and Alert procedure): Post Debridement Measurements of Total Wound Length: (cm) 3.5 Stage: Category/Stage III Width: (cm) 0.6 Depth: (cm) 0.2 Volume: (cm) 0.33 Character of Wound/Ulcer Post Debridement: Improved Post Procedure Diagnosis [name], [name] ([id number]) [id number] Page 2 of 16 Same as Pre-procedure Notes Scribed for Dr. Lady Gary by J.Scotton Electronic Signature(s) Signed: 06/16/2022 12:49:09 PM By: [name] MD FACS Signed: 06/16/2022 5:34:50 PM By: [name] RN Entered By: [name] on 06/16/2022 10:26:56 -------------------------------------------------------------------------------- Debridement Details Patient Name: Date of Service: [name], [name] [place] E. 06/16/2022 9:30 A M Medical Record Number: [id number] Patient Account Number: [id number] Date of Birth/Sex: Treating RN: 05-20-1976 (46 y.o. [name] Primary Care Provider: Dorinda Hill Other Clinician: Referring Provider: Treating Provider/Extender: Jana Hakim in Treatment: 100 Debridement Performed for Assessment: Wound #1 Right Calcaneus Performed By: Physician [name], MD Debridement Type: Debridement Level of Consciousness (Pre-procedure): Awake and Alert Pre-procedure Verification/Time Out Yes - 10:19 Taken: Start Time: 10:19 Pain Control: Lidocaine 5% topical ointment T Area Debrided (L x W): otal 0.1 (cm) x 0.1 (cm) = 0.01 (cm) Tissue and other material debrided: Non-Viable, Callus, Eschar, Slough, Slough Level: Non-Viable Tissue Debridement Description: Selective/Open Wound Instrument: Curette Bleeding: Minimum Hemostasis Achieved: Pressure End Time: 10:23 Procedural Pain: 0 Post Procedural Pain: 0 Response [name] Treatment: Procedure was tolerated well Level of Consciousness (Post- Awake and Alert procedure): Post Debridement Measurements of Total Wound Length: (cm) 0.1 Stage: Category/Stage III Width: (cm)  0.1 Depth: (cm) 0.1 Volume: (cm) 0.001 Character of Wound/Ulcer Post Debridement: Improved Post Procedure Diagnosis Same as Pre-procedure Notes Scribed for Dr. Lady Gary by J.Scotton Electronic Signature(s) Signed: 06/16/2022 12:49:09 PM By: [name] MD FACS Signed: 06/16/2022 5:34:50 PM By: [name] RN Entered By: [name] on 06/16/2022 10:28:06 [name] ([id number]) [id number] Page 3 of 16 -------------------------------------------------------------------------------- HPI Details Patient Name: Date of Service: [name], [name] [name] 06/16/2022 9:30 A M Medical Record Number: [id number] Patient Account Number: [id number] Date of Birth/Sex: Treating RN: 04/22/1976 (46 y.o. M) Primary  Care Provider: Dorinda Hill Other Clinician: Referring Provider: Treating Provider/Extender: Jana Hakim in Treatment: 100 History of Present Illness HPI Description: Wounds are12/03/2020 upon evaluation today patient presents for initial inspection here in our clinic concerning issues he has been having with the bottoms of his feet bilaterally. He states these actually occurred as wounds when he was hospitalized for 5 months secondary [name] Covid. He was apparently with tilting bed where he was in an upright position quite frequently and apparently this occurred in some way shape or form during that time. Fortunately there is no sign of active infection at this time. No fevers, chills, nausea, vomiting, or diarrhea. With that being said he still has substantial wounds on the plantar aspects of his feet Theragen require quite a bit of work [name] get these [name] heal. He has been using Santyl currently though that is been problematic both in receiving the medication as well as actually paid for it as it is become quite expensive. Prior [name] the experience with Covid the patient really did not have any major medical problems other than hypertension he  does have some mild generalized weakness following the Covid experience. 07/22/2020 on evaluation today patient appears [name] be doing okay in regard [name] his foot ulcers I feel like the wound beds are showing signs of better improvement that I do believe the Iodoflex is helping in this regard. With that being said he does have a lot of drainage currently and this is somewhat blue/green in nature which is consistent with Pseudomonas. I do think a culture today would be appropriate for [place] [name] evaluate and see if that is indeed the case I would likely start him on antibiotic orally as well he is not allergic [name] Cipro knows of no issues he has had in the past 12/21; patient was admitted [name] the clinic earlier this month with bilateral presumed pressure ulcers on the bottom of his feet apparently related [name] excessive pressure from a tilt table arrangement in the intensive care unit. Patient relates this [name] being on ECMO but I am not really sure that is exactly related [name] that. I must say I have never seen anything like this. He has fairly extensive full-thickness wounds extending from his heel towards his midfoot mostly centered laterally. There is already been some healing distally. He does not appear [name] have an arterial issue. He has been using gentamicin [name] the wound surfaces with Iodoflex [name] help with ongoing debridement 1/6; this is a patient with pressure ulcers on the bottom of his feet related [name] excessive pressure from a standing position in the intensive care unit. He is complaining of a lot of pain in the right heel. He is not a diabetic. 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 We have been using Iodoflex [name] help prepare the surfaces of both wounds for an advanced treatment product. He is nonambulatory spending most of his time in a wheelchair I have asked him not [name] propel the wheelchair with his heels 1/13; in general his wounds look better not much surface area change we  have been using Iodoflex as of last week. I did an x-ray of the right heel as the patient was complaining of pain. I had some thoughts about a stress fracture perhaps Achilles tendon problems however what it showed was erosive changes along the inferior aspect of the calcaneus he now has a MRI booked for 1/20. 1/20; in general his wounds continue [name] be better.  Some improvement in the large narrow areas proximally in his foot. He is still complaining of pain in the right heel and tenderness in certain areas of this wound. His MRI is tonight. I am not just looking for osteomyelitis that was brought up on the x-ray I am wondering about stress fractures, tendon ruptures etc. He has no such findings on the left. Also noteworthy is that the patient had critical illness neuropathy and some of the discomfort may be actual improvement in nerve function I am just not sure. These wounds were initially in the setting of severe critical illness related [name] COVID-19. He was put in a standing position. He may have also been on pressors at the point contributing [name] tissue ischemia. By his description at some point these wounds were grossly necrotic extending proximally up into the Achilles part of his heel. I do not know that I have ever really seen pictures of them like this although they may exist in epic We have ordered Tri layer Oasis. I am trying [name] stimulate some granulation in these areas. This is of course assuming the MRI is negative for infection 1/27; since the patient was last here he saw Dr. Earlene Plater of infectious disease. He is planned for vancomycin and ceftriaxone. Prior operative culture grew MSSA. Also ordered baseline lab work. He also ordered arterial studies although the ABIs in our clinic were normal as well as his clinical exam these were normal I do not think he needs [name] see vascular surgery. His ABIs at the [facility] were 1.22 in the right triphasic waveforms with a normal TBI of 1.15 on the left ABI  of 1.22 with triphasic waveforms and a normal TBI of 1.08. Finally he saw Dr. Logan Bores who will follow him in for 2 months. At this point I do not think he felt that he needed a procedure on the right calcaneal bone. Dr. Earlene Plater is elected for broad-spectrum antibiotic The patient is still having pain in the right heel. He walks with a walker 2/3; wounds are generally smaller. He is tolerating his IV antibiotics. I believe this is vancomycin and ceftriaxone. We are still waiting for Oasis burn in terms of his out-of-pocket max which he should be meeting soon given the IV antibiotics, MRIs etc. I have asked him [name] check in on this. We are using silver collagen in the meantime the wounds look better 2/10; tolerating IV vancomycin and Rocephin. We are waiting [name] apply for Oasis. Although I am not really sure where he is in his out-of-pocket max. 2/17 started the first application of Oasis trilayer. Still on antibiotics. The wounds have generally look better. The area on the left has a little more surface slough requiring debridement 2/24; second application of Oasis trilayer. The wound surface granulation is generally look better. The area on the left with undermining laterally I think is come in a bit. 10/08/2020 upon evaluation today patient is here today for [employer] application #3. Fortunately he seems [name] be doing extremely well with regard [name] this and we are seeing a lot of new epithelial growth which is great news. Fortunately there is no signs of active infection at this time. 10/16/2020 upon evaluation today patient appears [name] be doing well with regard [name] his foot ulcers. Do believe the Oasis has been of benefit for him. I do not see any signs of infection right now which is great news and I think that he has a lot of new epithelial growth which is great  [name] see as well. The patient is very pleased [name] hear all of this. I do think we can proceed with the Oasis trilayer #4 today. 3/18; not as  much improvement in these areas on his heels that I was hoping. I did reapply trilateral Oasis today the tissue looks healthier but not as much fill in as I was hoping. 3/25; better looking today I think this is come in a bit the tissue looks healthier. Triple layer Oasis reapplied #6 4/1; somewhat better looking definitely better looking surface not as much change in surface area as I was hoping. He may be spending more time [place] on days then he needs [name] although he does have heel offloading boots. Triple layer Oasis reapplied #7 4/7; unfortunately apparently [facility] will not approve any further Oasis which is unfortunate since the patient did respond nicely both in terms of the condition of the wound bed as well as surface area. There is still some drainage coming from the wound but not a lot there does not appear [name] be any infection 4/15; we have been using Hydrofera Blue. He continues [name] have nice rims of epithelialization on the right greater than the left. The left the epithelialization is coming from the tip of his heel. There is moderate drainage. In this that concerns me about a total contact cast. There is no evidence of infection 4/29; patient has been using Hydrofera Blue with dressing changes. He has no complaints or issues today. 5/5; using Hydrofera Blue. I actually think that he looks marginally better than the last time I saw this 3 weeks ago. There are rims of epithelialization on the left thumb coming from the medial side on the right. Using [address] [name], [name] ([id number]) [id number] Page 4 of 16 5/12; using Hydrofera Blue. These continue [name] make improvements in surface area. His drainage was not listed as severe I therefore went ahead and put a cast on the left foot. Right foot we will continue [name] dress his previous 5/16; back for first total contact cast change. He did not tolerate this particularly well cast injury on the  anterior tibia among other issues. Difficulty sleeping. I talked him about this in some detail and afterwards is elected [name] continue. I told him I would like [name] have a cast on for 3 weeks [name] see if this is going [name] help at all. I think he agreed 5/19; I think the wound is better. There is no tunneling towards his midfoot. The undermining medially also looks better. He has a rim of new skin distally. I think we are making progress here. The area on the left also continues [name] look somewhat better [name] me using Hydrofera Blue. He has a list of complaints about the cast but none of them seem serious 5/26; patient presents for 1 week follow-up. He has been using a total contact cast and tolerating this well. Hydrofera Blue is the main dressing used. He denies signs of infection. 6/2 Hydrofera Blue total contact cast on the left. These were large ulcers that formed in intensive care unit where the patient was recovering from COVID. May have had something to do with being ventilated in an upright positiono Pressors etc. We have been able [name] get the areas down considerably and a viable surface. There is some epithelialization in both sides. Note made of drainage 6/9; changed [name] [facility] last time because of drainage. He arrives with better looking surfaces and dimensions on the left  than the right. Paradoxically the right actually probes more towards his midfoot the left is largely close down but both of these look improved. Using a total contact cast on the left 6/16; complex wounds on his bilateral plantar heels which were initially pressure injury from a stay in the ICU with COVID. We have been using silver alginate most recently. His dimensions of come in quite dramatically however not recently. We have been putting the left foot in a total contact cast 6/23; complex wounds on the bilateral plantar heels. I been putting the left in the cast paradoxically the area on the right is the one that is going  towards closure at a faster rate. Quite a bit of drainage on the left. The patient went [name] see Dr. Logan Bores who said he was going [name] standby for skin grafts. I had actually considered sending him for skin grafts however he would be mandatorily off his feet for a period of weeks [name] months. I am thinking that the area on the right is going [name] close on its own the area on the left has been more stubborn even though we have him in a total contact cast 6/30; took him out of a total contact cast last week is the right heel seem [name] be making better progress than the left where I was placing the cast. We are using silver alginate. Both wounds are smaller right greater than left 7/12; both wounds look as though they are making some progress. We are using silver alginate. Heel offloading boots 7/26; very gradual progress especially on the right. Using silver alginate. He is wearing heel offloading boots 8/18; he continues [name] close these wounds down very gradually. Using silver alginate. The problem polymen being definitive about this is areas of what appears [name] be callus around the margins. This is not a 100% of the area but certainly sizable especially on the right 9/1; bilateral plantar feet wounds secondary [name] prolonged pressure while being ventilated for COVID-19 in an upright position. Essentially pressure ulcers on the bottom of his feet. He is made substantial progress using silver alginate. 9/14; bilateral plantar feet wounds secondary [name] prolonged pressure. Making progress using silver alginate. 9/29 bilateral plantar feet wounds secondary [name] prolonged pressure. I changed him [name] Iodoflex last week. MolecuLight showing reddened blush fluorescence 10/11; patient presents for follow-up. He has no issues or complaints today. He denies signs of infection. He continues [name] use Iodoflex and antibiotic ointment [name] the wound beds. 10/27; 2-week follow-up. No evidence of infection. He has callus and thick dry skin  around the wound margins we have been using Iodoflex and Bactroban which was in response [name] a moderate left MolecuLight reddish blush fluorescence. 11/10; 2-week follow-up. Wound margins again have thick callus however the measurements of the actual wound sites are a lot smaller. Everything looks reasonably healthy here. We have been using Iodoflex He was approved for prime matrix but I have elected [name] delay this given the improvement in the surface area. Hopefully I will not regret that decision as were getting close [name] the end of the year in terms of insurance payment 12/8; 2-week follow-up. Wounds are generally smaller in size. These were initially substantial wounds extending into the forefoot all the way into the heel on the bilateral plantar feet. They are now both located on the plantar heel distal aspect both of these have a lot of callus around the wounds I used a #5 curette [name] remove this on the right and  the left also some subcutaneous debris [name] try and get the wound edges were using Iodoflex. He has heel offloading shoe 12/22; 2-week follow-up. Not really much improvement. He has thick callus around the outer edges of both wounds. I remove this there is some nonviable subcutaneous tissue as well. We have been using Iodoflex. Her intake nurse and myself spontaneously thought of a total contact cast I went back in May. At that time we really were not seeing much of an improvement with a cast although the wound was in a much different situation I would like [name] retry this in 2 weeks and I discussed this with the patient 08/12/2021; the patient has had some improvement with the Iodoflex. The the area on the left heel plantar more improved than the right. I had [name] put him in a total contact cast on the left although I decided [name] put that off for 2 weeks. I am going [name] change his primary dressing [name] silver collagen. I think in both areas he has had some improvement most of the healing seems [name] be  more proximal in the heel. The wounds are in the mid aspect. A lot of thick callus on the right heel however. 1/19; we are using silver collagen on both plantar heel areas. He has had some improvement today. The left did not require any debridement. He still had some eschar on the right that was debrided but both seem [name] have contracted. I did not put it total contact cast on him today 2/2 we have been using silver collagen. The area on the right plantar heel has areas that appear [name] be epithelialized interspersed with dry flaking callus and dry skin. I removed this. This really looks better than on the other side. On the left still a large area with raised edges and debris on the surface. 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 He has no issues or complaints today. 2/9; not much change in the left foot wound with 1 week of a cast we are using silver collagen. Silver collagen on the right side. The right side has been the better wound surface. We will reapply the total contact cast on the left 2/16; not much improvement on either side I been using silver collagen with a total contact cast on the left. I'm changing the Hydrofera Blue still with a total contact cast on the left 2/23; some improvement on both sides. Disappointing that he has thick callus around the area that we are putting in a total contact cast on the left. We've been using Hydrofera Blue on both wound areas. This is a man who at essentially pressure ulcers in addition [name] ischemia caused by medications [name] support his blood pressure (pressors) in the ICU. He was being ventilated in the standing position for severe Covid. A Shiley the wounds extended across his entire foot but are now localized [name] his plantar heels bilaterally. We have made progress however neither areas healed. I continue [name] think the  total contact cast is helped albeit painstakingly slowly. He has never wanted a plastic surgery consult although I don't know that they would be interested in grafting in area in this location. 10/07/2021: Continued improvement bilaterally. There is still some callus around the left wound, despite the total contact cast. He has some increased pain in his right midfoot around 1 particular  area. This has been painful in the past but seems [name] be a little bit worse. When his cast was removed today, there was an area on the heel of the left foot that looks a bit macerated. He is also complaining of pain in his left thigh and hip which he thinks is secondary [name] the limb length discrepancy caused by the cast. 10/14/2021: He continues [name] improve. A little bit less callus around the left wound. He continues [name] endorse pain in his right midfoot, but this is not as significant as it was last week. The maceration on his left heel is improved. 10/21/2021: Continued improvement [name] both wounds. The maceration on his left heel is no longer evident. Less callus bilaterally. Epithelialization progressing. 10/28/2021: Significant improvement this week. The right sided wound is nearly closed with just a small open area at the middle. No maceration seen on the left heel. Continued epithelialization on both sides. No concern for infection. 11/04/2021: T oday, the wounds were measured a little bit differently and come out as larger, but I actually think they are about the same [name] potentially even smaller, particularly on the left. He continues [name] accumulate some callus on the right. [name], [name] ([id number]) [id number] Page 5 of 16 11/11/2021: T oday, the patient is expressing some concern that the left wound, despite being in the total contact cast, is not progressing at the same rate as the 1 on the right. He is interested in trying a week without the cast [name] see how the wound does. The wounds are  roughly the same size as last week, with the right perhaps being a little bit smaller. He continues [name] build up callus on both sites. 11/18/2021: Last week, I permitted the patient [name] go without his total contact cast, just [name] see if the cast was really making any difference. Today, both wounds have deteriorated [name] some extent, suggesting that the cast is providing benefit, at least on the left. Both are larger and have accumulated callus, slough, and other debris. 11/26/2021: I debrided both wounds quite aggressively last week in an effort [name] stimulate the healing cascade. This appears [name] have been effective as the left sided wound is a full centimeter shorter in length. Although the right was measured slightly larger, on inspection, it looks as though an area of epithelialized tissue was included in the measurements. We have been using PolyMem Ag on the wound surfaces with a total contact cast [name] the left. 12/02/2021: It appears that the intake personnel are including epithelialized tissue in his wound measurements; the right wound is almost completely epithelialized; there is just a crater at the proximal midfoot with some open areas. On the left, he has built up some callus, but the overall wound surface looks good. There is some senescent skin around the wound margin. He has been in PolyMem Ag bilaterally with a total contact cast on the left. 12/09/2021: The right wound is nearly closed; there is just a small open area at the mid calcaneus. On the left, the wound is smaller with minimal callus buildup. No significant drainage. 12/16/2021: The right calcaneal wound remains minimally open at the mid calcaneus; the rest has epithelialized. On the left, the wound is also a little bit smaller. There is some senescent tissue on the periphery. He is getting his first application of a trial skin substitute called Vendaje today. 12/23/2021: The wound on his right calcaneus is nearly closed; there is just a  small area at the  most distal aspect of the calcaneus that is open. On the left, the area where we applied [name] the skin substitute has a healthier-looking bed of granulation tissue. The wound dimensions are not significantly different on this side but the wound surface is improved. 12/30/2021: The wound on the right calcaneus has not changed significantly aside from some accumulation of callus. On the left, the open area is smaller and continues [name] have an improved surface. He continues [name] accumulate callus around the wound. He is here for his third application of Vendaje. 01/06/2022: The right calcaneal wound is down [name] just a couple of millimeters. He continues [name] accumulate periwound callus. He unfortunately got his cast wet earlier in the week and his left foot is macerated, resulting in some superficial skin loss just distal [name] the open wound. The open wound itself, however, is much smaller and has a healthier appearing surface. He is here for his fourth application of Vendaje. 01/13/2022: The right calcaneal wound is about the same. Unfortunately, once again, his cast got wet and his foot is again macerated. This is caused the left calcaneal wound [name] enlarge. He is here for his fifth application of Vendaje. 01/20/2022: The right calcaneal wound is very small. There is some periwound callus accumulation. He purchased a new cast protector last week and this has been effective in avoiding the maceration that has been occurring on the left. The left calcaneal wound is narrower and has a healthy and viable-appearing surface. He is here for his 6 application of Vendaje. 01/27/2022: The right calcaneal wound is down [name] just a pinhole. There is some periwound slough and callus. On the left, the wound is narrower and shorter by about a centimeter. The surface is robust and viable-appearing. Unfortunately, the rep for the trial skin substitute product did not provide any for [place] [name] use today. 02/04/2022: The right  calcaneal wound remains unchanged. There is more accumulated callus. On the left, although the intake nurse measured it a little bit longer, it looks about the same [name] me. The surface has a layer of slough, but underneath this, there is good granulation tissue. 02/10/2022: The right calcaneus wound is nearly closed. There is still some callus that builds up around the site. The left side looks about the same in terms of dimensions, but the surface is more robust and vital-appearing. 02/16/2022: The area of the right calcaneus that was nearly closed last week has closed, but there is a small opening at the mid foot where it looks like some moisture got retained and caused some reopening. The left foot wound is narrower and shallower. Both sites have a fair amount of periwound callus and eschar. 02/24/2022: The small midfoot opening on the right calcaneus is a little bit smaller today. The left foot wound is narrower and shallower. He continues [name] accumulate periwound callus. No concern for infection. 03/01/2022: The patient came [name] clinic early because he showered and got his cast wet. Fortunately, there is no significant maceration [name] his foot but the callus softened and it looks like the wound on his left calcaneus may be a little bit wider. The wound on his right calcaneus is just a narrow slit. Continued accumulation of periwound callus bilaterally. 03/08/2022: The wound on his right calcaneus is very nearly closed, just a small pinpoint opening under a bit of eschar; the left wound has come in quite a bit, as well. It is narrower and shorter than at our last visit. Still with accumulated callus  and eschar bilaterally. 03/17/2022: The right calcaneal wound is healed. The left wound is smaller and the surface itself is very clean, but there is some blue-green staining on the periwound callus, concerning for Pseudomonas aeruginosa. 03/23/2022: The right calcaneal wound remains closed. The left wound continues  [name] contract. No further blue-green staining. Small amount of callus and slough accumulation. 03/28/2022: He came in early today because he had gotten his cast wet. On inspection, the wound itself did not get wet or macerated, just a little bit of the forefoot. The wound itself is basically unchanged. 04/07/2022: The right foot wound remains closed. The left wound is the smallest that I have seen it [name] date. It is narrower and shorter. It still continues [name] accumulate slough on the surface. 04/15/2022: There is a band of epithelium now dividing the small left plantar foot wound in 2. There is still some slough on the surface. 04/21/2022: The wound continues [name] narrow. Just a little bit of slough on the surface. He seems [name] be responding well [name] endoform. 04/28/2022: Continued slow contraction of the wound. There is a little slough on the surface and some periwound callus. We have been using endoform and total contact cast. 05/05/2022: The wound appears [name] have stalled. There is slough and some periwound eschar/callus. No concern for infection, however. 05/12/2022: Unfortunately, his right foot has reopened. It is located at the most posterior aspect of his surgical incision. The area was noted [name] have drainage coming from it when his padding was removed today. Underneath some callus and senescent skin, there is an opening. No purulent drainage or malodor. On the left foot, the wound is again unchanged. There is some light blue staining on the callus, but no malodor or purulent drainage. 10/13; right and left heel remanence of extensive plantar foot wounds. These are better than I remember by quite a big margin however he is still left with wounds on the left plantar heel and the right plantar heel. Been using endoform bilaterally. A culture was done that showed apparently Pseudomonas but we are still waiting for the [facility] antibiotic [name] use gentamicin today. He is still very active by description I am not  sure about the offloading of his noncasted right [name], [name] ([id number]) [id number] Page 6 of 16 foot 10/20; both wounds right and left heel debrided not much change from last week. [name] Echevaria has arrived which is linezolid, gentamicin and ciprofloxacin we will use this with endoform. T contact cast on the left otal 06/02/2022: Both wounds are smaller today. There is still a fair amount of callus buildup around the right foot ulcer. The left is more superficial and nearly flush with the surrounding tissues. Also with slough and eschar buildup. 06/10/2022: The right sided wound appears [name] be nearly closed, if not completely so, although it is somewhat difficult [name] tell given the abnormal tissue and scarring in his foot. There is a fair amount of callus and crust accumulation. On the left, the wound looks about the same, again with callus and slough. He has an appointment next Thursday with Dr. Annamary Rummage in podiatry; I am hopeful that there may be some reconstructive options available for Mr. Hildreth. 06/16/2022: Both wounds have some eschar and callus accumulation. The right sided wound is extremely narrow and barely open; the left is narrower than last week. There is a little bit of slough. He has his appointment in podiatry later today. Electronic Signature(s) Signed: 06/16/2022 10:38:43 AM By: [name] MD FACS Entered  By: [name] on 06/16/2022 10:38:43 -------------------------------------------------------------------------------- Physical Exam Details Patient Name: Date of Service: [name] [name] [name] 06/16/2022 9:30 A M Medical Record Number: [id number] Patient Account Number: [id number] Date of Birth/Sex: Treating RN: 09-04-1975 (46 y.o. M) Primary Care Provider: Dorinda Hill Other Clinician: Referring Provider: Treating Provider/Extender: Jana Hakim in Treatment: 100 Constitutional . . . . No acute  distress.Marland Kitchen Respiratory Normal work of breathing on room air.. Notes 06/16/2022: Both wounds have some eschar and callus accumulation. The right sided wound is extremely narrow and barely open; the left is narrower than last week. There is a little bit of slough. Electronic Signature(s) Signed: 06/16/2022 10:39:16 AM By: [name] MD FACS Entered By: [name] on 06/16/2022 10:39:16 -------------------------------------------------------------------------------- Physician Orders Details Patient Name: Date of Service: [name], [name] [place] E. 06/16/2022 9:30 A M Medical Record Number: [id number] Patient Account Number: [id number] Date of Birth/Sex: Treating RN: 1976-05-07 (46 y.o. [name] Primary Care Provider: Dorinda Hill Other Clinician: Referring Provider: Treating Provider/Extender: Jana Hakim in Treatment: 100 Verbal / Phone Orders: No Diagnosis Coding ICD-10 Coding Code Description [phone] Non-pressure chronic ulcer of other part of right foot with fat layer exposed L97.522 Non-pressure chronic ulcer of other part of left foot with fat layer exposed Follow-up Appointments SHAYAAN, PARKE ([id number]) [id number] Page 7 of 16 ppointment in 1 week. - Dr. Lady Gary Room 3 Return A 11/16 at 9:30 AM 11/22 at 10:15 AM Other: - After appointment with [facility], put [facility] and Endoform on both heel wounds. Anesthetic Wound #2 Left Calcaneus (In clinic) Topical Lidocaine 4% applied [name] wound bed - In clinic Bathing/ Shower/ Hygiene May shower with protection but do not get wound dressing(s) wet. - Cover with cast protector (can purchase cast protector at [facility] or [facility] ) Edema Control - Lymphedema / SCD / Other Bilateral Lower Extremities Elevate legs [name] the level of the heart or above for 30 minutes daily and/or when sitting, a frequency of: - throughout the day Avoid standing for long periods of  time. Moisturize legs daily. - right leg and foot every night. Off-Loading Total Contact Cast [name] Left Lower Extremity - TCC [name] Left LL (T Cast on hold this week-06/16/22) otal Other: - keep pressure off of the bottom of your feet. Globoped [name] left foot-when not wearing the cast Additional Orders / Instructions Follow Nutritious Diet Non Wound Condition Right Lower Extremity Other Non Wound Condition Orders/Instructions: - Right Heel-Use Opifoam and Border Foam and Kerlix/Medipore soft cloth surgical tape (4x10 in/yd) for Non wound on Right Heel. Wound Treatment Wound #1 - Calcaneus Wound Laterality: Right Cleanser: Normal Saline (Generic) Every Other Day/30 Days Discharge Instructions: Cleanse the wound with Normal Saline prior [name] applying a clean dressing using gauze sponges, not tissue or cotton balls. Cleanser: Wound Cleanser (Generic) Every Other Day/30 Days Discharge Instructions: Cleanse the wound with wound cleanser prior [name] applying a clean dressing using gauze sponges, not tissue or cotton balls. Prim Dressing: Endoform 2x2 in (Generic) Every Other Day/30 Days ary Discharge Instructions: Moisten with saline Secondary Dressing: ALLEVYN Heel 4 1/2in x 5 1/2in / 10.5cm x 13.5cm (Generic) Every Other Day/30 Days Discharge Instructions: Apply over primary dressing as directed. Secondary Dressing: Woven Gauze Sponge, Non-Sterile 4x4 in Every Other Day/30 Days Discharge Instructions: Apply over primary dressing as directed. Secured With: Insurance underwriter, Sterile 2x75 (in/in) (Generic) Every Other Day/30 Days Discharge Instructions: Secure with stretch gauze as directed. Secured With:  46M Medipore H Soft Cloth Surgical T ape, 4 x 10 (in/yd) (Generic) Every Other Day/30 Days Discharge Instructions: Secure with tape as directed. Wound #2 - Calcaneus Wound Laterality: Left Cleanser: Normal Saline (Generic) 1 x Per Week/30 Days Discharge Instructions: Cleanse the wound with  Normal Saline prior [name] applying a clean dressing using gauze sponges, not tissue or cotton balls. Cleanser: Wound Cleanser 1 x Per Week/30 Days Discharge Instructions: Cleanse the wound with wound cleanser prior [name] applying a clean dressing using gauze sponges, not tissue or cotton balls. Topical: TCC 1 x Per Week/30 Days Discharge Instructions: [name] left leg (on hold 06/16/22) Topical: keystone 1 x Per Week/30 Days Discharge Instructions: use after appointment with [facility] Prim Dressing: Endoform 2x2 in 1 x Per Week/30 Days ary Discharge Instructions: Moisten with saline. Put on after appt. with [facility]11/9/23 Secondary Dressing: Optifoam Non-Adhesive Dressing, 4x4 in (Generic) 1 x Per Week/30 Days Discharge Instructions: Apply over primary dressing as directed. Secondary Dressing: Woven Gauze Sponge, Non-Sterile 4x4 in 1 x Per Week/30 Days Discharge Instructions: Apply over primary dressing as directed. Secured With: 46M Medipore H Soft Cloth Surgical T ape, 4 x 10 (in/yd) (Generic) 1 x Per Week/30 Days Discharge Instructions: Secure with tape as directed. [name], [name] ([id number]) [id number] Page 8 of 16 Electronic Signature(s) Signed: 06/16/2022 12:49:09 PM By: [name] MD FACS Entered By: [name] on 06/16/2022 10:39:38 -------------------------------------------------------------------------------- Problem List Details Patient Name: Date of Service: [name], [name] [place] E. 06/16/2022 9:30 A M Medical Record Number: [id number] Patient Account Number: [id number] Date of Birth/Sex: Treating RN: June 11, 1976 (46 y.o. M) Primary Care Provider: Dorinda Hill Other Clinician: Referring Provider: Treating Provider/Extender: Jana Hakim in Treatment: 100 Active Problems ICD-10 Encounter Code Description Active Date MDM Diagnosis L97.512 Non-pressure chronic ulcer of other part of right foot with fat layer  exposed 09/03/2020 No Yes L97.522 Non-pressure chronic ulcer of other part of left foot with fat layer exposed 09/03/2020 No Yes Inactive Problems ICD-10 Code Description Active Date Inactive Date L89.893 Pressure ulcer of other site, stage 3 07/15/2020 07/15/2020 M62.81 Muscle weakness (generalized) 07/15/2020 07/15/2020 I10 Essential (primary) hypertension 07/15/2020 07/15/2020 M86.171 Other acute osteomyelitis, right ankle and foot 09/03/2020 09/03/2020 Resolved Problems Electronic Signature(s) Signed: 06/16/2022 10:32:04 AM By: [name] MD FACS Entered By: [name] on 06/16/2022 10:32:03 -------------------------------------------------------------------------------- Progress Note Details Patient Name: Date of Service: [name], [name] [place] E. 06/16/2022 9:30 A M Medical Record Number: [id number] Patient Account Number: [id number] [name], [name] ([id number]) [id number] Page 9 of 16 Date of Birth/Sex: Treating RN: October 26, 1975 (46 y.o. M) Primary Care Provider: Other Clinician: Dorinda Hill Referring Provider: Treating Provider/Extender: Jana Hakim in Treatment: 100 Subjective Chief Complaint Information obtained from Patient Bilateral Plantar Foot Ulcers History of Present Illness (HPI) Wounds are12/03/2020 upon evaluation today patient presents for initial inspection here in our clinic concerning issues he has been having with the bottoms of his feet bilaterally. He states these actually occurred as wounds when he was hospitalized for 5 months secondary [name] Covid. He was apparently with tilting bed where he was in an upright position quite frequently and apparently this occurred in some way shape or form during that time. Fortunately there is no sign of active infection at this time. No fevers, chills, nausea, vomiting, or diarrhea. With that being said he still has substantial wounds on the plantar aspects of his feet Theragen  require quite a bit of work [name] get these  [name] heal. He has been using Santyl currently though that is been problematic both in receiving the medication as well as actually paid for it as it is become quite expensive. Prior [name] the experience with Covid the patient really did not have any major medical problems other than hypertension he does have some mild generalized weakness following the Covid experience. 07/22/2020 on evaluation today patient appears [name] be doing okay in regard [name] his foot ulcers I feel like the wound beds are showing signs of better improvement that I do believe the Iodoflex is helping in this regard. With that being said he does have a lot of drainage currently and this is somewhat blue/green in nature which is consistent with Pseudomonas. I do think a culture today would be appropriate for [place] [name] evaluate and see if that is indeed the case I would likely start him on antibiotic orally as well he is not allergic [name] Cipro knows of no issues he has had in the past 12/21; patient was admitted [name] the clinic earlier this month with bilateral presumed pressure ulcers on the bottom of his feet apparently related [name] excessive pressure from a tilt table arrangement in the intensive care unit. Patient relates this [name] being on ECMO but I am not really sure that is exactly related [name] that. I must say I have never seen anything like this. He has fairly extensive full-thickness wounds extending from his heel towards his midfoot mostly centered laterally. There is already been some healing distally. He does not appear [name] have an arterial issue. He has been using gentamicin [name] the wound surfaces with Iodoflex [name] help with ongoing debridement 1/6; this is a patient with pressure ulcers on the bottom of his feet related [name] excessive pressure from a standing position in the intensive care unit. He is complaining of a lot of pain in the right heel. He is not a diabetic. 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 We have been using Iodoflex [name] help prepare the surfaces of both wounds for an advanced treatment product. He is nonambulatory spending most of his time in a wheelchair I have asked him not [name] propel the wheelchair with his heels 1/13; in general his wounds look better not much surface area change we have been using Iodoflex as of last week. I did an x-ray of the right heel as the patient was complaining of pain. I had some thoughts about a stress fracture perhaps Achilles tendon problems however what it showed was erosive changes along the inferior aspect of the calcaneus he now has a MRI booked for 1/20. 1/20; in general his wounds continue [name] be better. Some improvement in the large narrow areas proximally in his foot. He is still complaining of pain in the right heel and tenderness in certain areas of this wound. His MRI is tonight. I am not just looking for osteomyelitis that was brought up on the x-ray I am wondering about stress fractures, tendon ruptures etc. He has no such findings on the left. Also noteworthy is that the patient had critical illness neuropathy and some of the discomfort may be actual improvement in nerve function I am just not sure. These wounds were initially in the setting of severe critical illness related [name] COVID-19. He was put in a standing position. He may have also been on pressors at the point contributing [name] tissue ischemia. By his description at some point these wounds were grossly necrotic extending proximally up into  the Achilles part of his heel. I do not know that I have ever really seen pictures of them like this although they may exist in epic We have ordered Tri layer Oasis. I am trying [name] stimulate some granulation in these areas. This is of course assuming the MRI is negative for infection 1/27; since the patient was last here he saw Dr. Earlene Plater of infectious disease. He is planned for vancomycin and ceftriaxone. Prior  operative culture grew MSSA. Also ordered baseline lab work. He also ordered arterial studies although the ABIs in our clinic were normal as well as his clinical exam these were normal I do not think he needs [name] see vascular surgery. His ABIs at the PTA were 1.22 in the right triphasic waveforms with a normal TBI of 1.15 on the left ABI of 1.22 with triphasic waveforms and a normal TBI of 1.08. Finally he saw Dr. Logan Bores who will follow him in for 2 months. At this point I do not think he felt that he needed a procedure on the right calcaneal bone. Dr. Earlene Plater is elected for broad-spectrum antibiotic The patient is still having pain in the right heel. He walks with a walker 2/3; wounds are generally smaller. He is tolerating his IV antibiotics. I believe this is vancomycin and ceftriaxone. We are still waiting for Oasis burn in terms of his out-of-pocket max which he should be meeting soon given the IV antibiotics, MRIs etc. I have asked him [name] check in on this. We are using silver collagen in the meantime the wounds look better 2/10; tolerating IV vancomycin and Rocephin. We are waiting [name] apply for Oasis. Although I am not really sure where he is in his out-of-pocket max. 2/17 started the first application of Oasis trilayer. Still on antibiotics. The wounds have generally look better. The area on the left has a little more surface slough requiring debridement 2/24; second application of Oasis trilayer. The wound surface granulation is generally look better. The area on the left with undermining laterally I think is come in a bit. 10/08/2020 upon evaluation today patient is here today for [employer] application #3. Fortunately he seems [name] be doing extremely well with regard [name] this and we are seeing a lot of new epithelial growth which is great news. Fortunately there is no signs of active infection at this time. 10/16/2020 upon evaluation today patient appears [name] be doing well with regard [name] his  foot ulcers. Do believe the Oasis has been of benefit for him. I do not see any signs of infection right now which is great news and I think that he has a lot of new epithelial growth which is great [name] see as well. The patient is very pleased [name] hear all of this. I do think we can proceed with the Oasis trilayer #4 today. 3/18; not as much improvement in these areas on his heels that I was hoping. I did reapply trilateral Oasis today the tissue looks healthier but not as much fill in as I was hoping. 3/25; better looking today I think this is come in a bit the tissue looks healthier. Triple layer Oasis reapplied #6 4/1; somewhat better looking definitely better looking surface not as much change in surface area as I was hoping. He may be spending more time [place] on days then he needs [name] although he does have heel offloading boots. Triple layer Oasis reapplied #7 4/7; unfortunately apparently [facility] will not approve any further Oasis which  is unfortunate since the patient did respond nicely both in terms of the condition of the wound bed as well as surface area. There is still some drainage coming from the wound but not a lot there does not appear [name] be any infection 4/15; we have been using Hydrofera Blue. He continues [name] have nice rims of epithelialization on the right greater than the left. The left the epithelialization is coming from the tip of his heel. There is moderate drainage. In this that concerns me about a total contact cast. There is no evidence of infection 4/29; patient has been using Hydrofera Blue with dressing changes. He has no complaints or issues today. 5/5; using Hydrofera Blue. I actually think that he looks marginally better than the last time I saw this 3 weeks ago. There are rims of epithelialization on the left thumb coming from the medial side on the right. Using [address] [name], [name] ([id number]) [id number] Page 10 of  16 5/12; using Hydrofera Blue. These continue [name] make improvements in surface area. His drainage was not listed as severe I therefore went ahead and put a cast on the left foot. Right foot we will continue [name] dress his previous 5/16; back for first total contact cast change. He did not tolerate this particularly well cast injury on the anterior tibia among other issues. Difficulty sleeping. I talked him about this in some detail and afterwards is elected [name] continue. I told him I would like [name] have a cast on for 3 weeks [name] see if this is going [name] help at all. I think he agreed 5/19; I think the wound is better. There is no tunneling towards his midfoot. The undermining medially also looks better. He has a rim of new skin distally. I think we are making progress here. The area on the left also continues [name] look somewhat better [name] me using Hydrofera Blue. He has a list of complaints about the cast but none of them seem serious 5/26; patient presents for 1 week follow-up. He has been using a total contact cast and tolerating this well. Hydrofera Blue is the main dressing used. He denies signs of infection. 6/2 Hydrofera Blue total contact cast on the left. These were large ulcers that formed in intensive care unit where the patient was recovering from COVID. May have had something to do with being ventilated in an upright positiono Pressors etc. We have been able [name] get the areas down considerably and a viable surface. There is some epithelialization in both sides. Note made of drainage 6/9; changed [name] [facility] last time because of drainage. He arrives with better looking surfaces and dimensions on the left than the right. Paradoxically the right actually probes more towards his midfoot the left is largely close down but both of these look improved. Using a total contact cast on the left 6/16; complex wounds on his bilateral plantar heels which were initially pressure injury from a stay in the ICU  with COVID. We have been using silver alginate most recently. His dimensions of come in quite dramatically however not recently. We have been putting the left foot in a total contact cast 6/23; complex wounds on the bilateral plantar heels. I been putting the left in the cast paradoxically the area on the right is the one that is going towards closure at a faster rate. Quite a bit of drainage on the left. The patient went [name] see Dr. Logan Bores who said he was going [name] standby for  skin grafts. I had actually considered sending him for skin grafts however he would be mandatorily off his feet for a period of weeks [name] months. I am thinking that the area on the right is going [name] close on its own the area on the left has been more stubborn even though we have him in a total contact cast 6/30; took him out of a total contact cast last week is the right heel seem [name] be making better progress than the left where I was placing the cast. We are using silver alginate. Both wounds are smaller right greater than left 7/12; both wounds look as though they are making some progress. We are using silver alginate. Heel offloading boots 7/26; very gradual progress especially on the right. Using silver alginate. He is wearing heel offloading boots 8/18; he continues [name] close these wounds down very gradually. Using silver alginate. The problem polymen being definitive about this is areas of what appears [name] be callus around the margins. This is not a 100% of the area but certainly sizable especially on the right 9/1; bilateral plantar feet wounds secondary [name] prolonged pressure while being ventilated for COVID-19 in an upright position. Essentially pressure ulcers on the bottom of his feet. He is made substantial progress using silver alginate. 9/14; bilateral plantar feet wounds secondary [name] prolonged pressure. Making progress using silver alginate. 9/29 bilateral plantar feet wounds secondary [name] prolonged pressure. I changed  him [name] Iodoflex last week. MolecuLight showing reddened blush fluorescence 10/11; patient presents for follow-up. He has no issues or complaints today. He denies signs of infection. He continues [name] use Iodoflex and antibiotic ointment [name] the wound beds. 10/27; 2-week follow-up. No evidence of infection. He has callus and thick dry skin around the wound margins we have been using Iodoflex and Bactroban which was in response [name] a moderate left MolecuLight reddish blush fluorescence. 11/10; 2-week follow-up. Wound margins again have thick callus however the measurements of the actual wound sites are a lot smaller. Everything looks reasonably healthy here. We have been using Iodoflex He was approved for prime matrix but I have elected [name] delay this given the improvement in the surface area. Hopefully I will not regret that decision as were getting close [name] the end of the year in terms of insurance payment 12/8; 2-week follow-up. Wounds are generally smaller in size. These were initially substantial wounds extending into the forefoot all the way into the heel on the bilateral plantar feet. They are now both located on the plantar heel distal aspect both of these have a lot of callus around the wounds I used a #5 curette [name] remove this on the right and the left also some subcutaneous debris [name] try and get the wound edges were using Iodoflex. He has heel offloading shoe 12/22; 2-week follow-up. Not really much improvement. He has thick callus around the outer edges of both wounds. I remove this there is some nonviable subcutaneous tissue as well. We have been using Iodoflex. Her intake nurse and myself spontaneously thought of a total contact cast I went back in May. At that time we really were not seeing much of an improvement with a cast although the wound was in a much different situation I would like [name] retry this in 2 weeks and I discussed this with the patient 08/12/2021; the patient has had some  improvement with the Iodoflex. The the area on the left heel plantar more improved than the right. I had [name] put him  in a total contact cast on the left although I decided [name] put that off for 2 weeks. I am going [name] change his primary dressing [name] silver collagen. I think in both areas he has had some improvement most of the healing seems [name] be more proximal in the heel. The wounds are in the mid aspect. A lot of thick callus on the right heel however. 1/19; we are using silver collagen on both plantar heel areas. He has had some improvement today. The left did not require any debridement. He still had some eschar on the right that was debrided but both seem [name] have contracted. I did not put it total contact cast on him today 2/2 we have been using silver collagen. The area on the right plantar heel has areas that appear [name] be epithelialized interspersed with dry flaking callus and dry skin. I removed this. This really looks better than on the other side. On the left still a large area with raised edges and debris on the surface. 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 He has no issues or complaints today. 2/9; not much change in the left foot wound with 1 week of a cast we are using silver collagen. Silver collagen on the right side. The right side has been the better wound surface. We will reapply the total contact cast on the left 2/16; not much improvement on either side I been using silver collagen with a total contact cast on the left. I'm changing the Hydrofera Blue still with a total contact cast on the left 2/23; some improvement on both sides. Disappointing that he has thick callus around the area that we are putting in a total contact cast on the left. We've been using Hydrofera Blue on both wound areas. This is a man who at essentially pressure ulcers in  addition [name] ischemia caused by medications [name] support his blood pressure (pressors) in the ICU. He was being ventilated in the standing position for severe Covid. A Shiley the wounds extended across his entire foot but are now localized [name] his plantar heels bilaterally. We have made progress however neither areas healed. I continue [name] think the total contact cast is helped albeit painstakingly slowly. He has never wanted a plastic surgery consult although I don't know that they would be interested in grafting in area in this location. 10/07/2021: Continued improvement bilaterally. There is still some callus around the left wound, despite the total contact cast. He has some increased pain in his right midfoot around 1 particular area. This has been painful in the past but seems [name] be a little bit worse. When his cast was removed today, there was an area on the heel of the left foot that looks a bit macerated. He is also complaining of pain in his left thigh and hip which he thinks is secondary [name] the limb length discrepancy caused by the cast. 10/14/2021: He continues [name] improve. A little bit less callus around the left wound. He continues [name] endorse pain in his right midfoot, but this is not as significant as it was last week. The maceration on his left heel is improved. 10/21/2021: Continued improvement [name] both wounds. The maceration on his left heel is no longer evident. Less callus bilaterally. Epithelialization progressing. 10/28/2021: Significant improvement this week. The right sided wound is nearly closed with just  a small open area at the middle. No maceration seen on the left heel. Continued epithelialization on both sides. No concern for infection. 11/04/2021: T oday, the wounds were measured a little bit differently and come out as larger, but I actually think they are about the same [name] potentially even smaller, particularly on the left. He continues [name] accumulate some callus on the  right. [name], [name] ([id number]) [id number] Page 11 of 16 11/11/2021: T oday, the patient is expressing some concern that the left wound, despite being in the total contact cast, is not progressing at the same rate as the 1 on the right. He is interested in trying a week without the cast [name] see how the wound does. The wounds are roughly the same size as last week, with the right perhaps being a little bit smaller. He continues [name] build up callus on both sites. 11/18/2021: Last week, I permitted the patient [name] go without his total contact cast, just [name] see if the cast was really making any difference. Today, both wounds have deteriorated [name] some extent, suggesting that the cast is providing benefit, at least on the left. Both are larger and have accumulated callus, slough, and other debris. 11/26/2021: I debrided both wounds quite aggressively last week in an effort [name] stimulate the healing cascade. This appears [name] have been effective as the left sided wound is a full centimeter shorter in length. Although the right was measured slightly larger, on inspection, it looks as though an area of epithelialized tissue was included in the measurements. We have been using PolyMem Ag on the wound surfaces with a total contact cast [name] the left. 12/02/2021: It appears that the intake personnel are including epithelialized tissue in his wound measurements; the right wound is almost completely epithelialized; there is just a crater at the proximal midfoot with some open areas. On the left, he has built up some callus, but the overall wound surface looks good. There is some senescent skin around the wound margin. He has been in PolyMem Ag bilaterally with a total contact cast on the left. 12/09/2021: The right wound is nearly closed; there is just a small open area at the mid calcaneus. On the left, the wound is smaller with minimal callus buildup. No significant drainage. 12/16/2021: The  right calcaneal wound remains minimally open at the mid calcaneus; the rest has epithelialized. On the left, the wound is also a little bit smaller. There is some senescent tissue on the periphery. He is getting his first application of a trial skin substitute called Vendaje today. 12/23/2021: The wound on his right calcaneus is nearly closed; there is just a small area at the most distal aspect of the calcaneus that is open. On the left, the area where we applied [name] the skin substitute has a healthier-looking bed of granulation tissue. The wound dimensions are not significantly different on this side but the wound surface is improved. 12/30/2021: The wound on the right calcaneus has not changed significantly aside from some accumulation of callus. On the left, the open area is smaller and continues [name] have an improved surface. He continues [name] accumulate callus around the wound. He is here for his third application of Vendaje. 01/06/2022: The right calcaneal wound is down [name] just a couple of millimeters. He continues [name] accumulate periwound callus. He unfortunately got his cast wet earlier in the week and his left foot is macerated, resulting in some superficial skin loss just distal [name] the open wound. The  open wound itself, however, is much smaller and has a healthier appearing surface. He is here for his fourth application of Vendaje. 01/13/2022: The right calcaneal wound is about the same. Unfortunately, once again, his cast got wet and his foot is again macerated. This is caused the left calcaneal wound [name] enlarge. He is here for his fifth application of Vendaje. 01/20/2022: The right calcaneal wound is very small. There is some periwound callus accumulation. He purchased a new cast protector last week and this has been effective in avoiding the maceration that has been occurring on the left. The left calcaneal wound is narrower and has a healthy and viable-appearing surface. He is here for his 6  application of Vendaje. 01/27/2022: The right calcaneal wound is down [name] just a pinhole. There is some periwound slough and callus. On the left, the wound is narrower and shorter by about a centimeter. The surface is robust and viable-appearing. Unfortunately, the rep for the trial skin substitute product did not provide any for [place] [name] use today. 02/04/2022: The right calcaneal wound remains unchanged. There is more accumulated callus. On the left, although the intake nurse measured it a little bit longer, it looks about the same [name] me. The surface has a layer of slough, but underneath this, there is good granulation tissue. 02/10/2022: The right calcaneus wound is nearly closed. There is still some callus that builds up around the site. The left side looks about the same in terms of dimensions, but the surface is more robust and vital-appearing. 02/16/2022: The area of the right calcaneus that was nearly closed last week has closed, but there is a small opening at the mid foot where it looks like some moisture got retained and caused some reopening. The left foot wound is narrower and shallower. Both sites have a fair amount of periwound callus and eschar. 02/24/2022: The small midfoot opening on the right calcaneus is a little bit smaller today. The left foot wound is narrower and shallower. He continues [name] accumulate periwound callus. No concern for infection. 03/01/2022: The patient came [name] clinic early because he showered and got his cast wet. Fortunately, there is no significant maceration [name] his foot but the callus softened and it looks like the wound on his left calcaneus may be a little bit wider. The wound on his right calcaneus is just a narrow slit. Continued accumulation of periwound callus bilaterally. 03/08/2022: The wound on his right calcaneus is very nearly closed, just a small pinpoint opening under a bit of eschar; the left wound has come in quite a bit, as well. It is narrower and shorter  than at our last visit. Still with accumulated callus and eschar bilaterally. 03/17/2022: The right calcaneal wound is healed. The left wound is smaller and the surface itself is very clean, but there is some blue-green staining on the periwound callus, concerning for Pseudomonas aeruginosa. 03/23/2022: The right calcaneal wound remains closed. The left wound continues [name] contract. No further blue-green staining. Small amount of callus and slough accumulation. 03/28/2022: He came in early today because he had gotten his cast wet. On inspection, the wound itself did not get wet or macerated, just a little bit of the forefoot. The wound itself is basically unchanged. 04/07/2022: The right foot wound remains closed. The left wound is the smallest that I have seen it [name] date. It is narrower and shorter. It still continues [name] accumulate slough on the surface. 04/15/2022: There is a band of epithelium now dividing  the small left plantar foot wound in 2. There is still some slough on the surface. 04/21/2022: The wound continues [name] narrow. Just a little bit of slough on the surface. He seems [name] be responding well [name] endoform. 04/28/2022: Continued slow contraction of the wound. There is a little slough on the surface and some periwound callus. We have been using endoform and total contact cast. 05/05/2022: The wound appears [name] have stalled. There is slough and some periwound eschar/callus. No concern for infection, however. 05/12/2022: Unfortunately, his right foot has reopened. It is located at the most posterior aspect of his surgical incision. The area was noted [name] have drainage coming from it when his padding was removed today. Underneath some callus and senescent skin, there is an opening. No purulent drainage or malodor. On the left foot, the wound is again unchanged. There is some light blue staining on the callus, but no malodor or purulent drainage. 10/13; right and left heel remanence of extensive plantar  foot wounds. These are better than I remember by quite a big margin however he is still left with wounds on the left plantar heel and the right plantar heel. Been using endoform bilaterally. A culture was done that showed apparently Pseudomonas but we are still waiting for the [facility] antibiotic [name] use gentamicin today. He is still very active by description I am not sure about the offloading of his noncasted right [name], [name] ([id number]) [id number] Page 12 of 16 foot 10/20; both wounds right and left heel debrided not much change from last week. [name] Echevaria has arrived which is linezolid, gentamicin and ciprofloxacin we will use this with endoform. T contact cast on the left otal 06/02/2022: Both wounds are smaller today. There is still a fair amount of callus buildup around the right foot ulcer. The left is more superficial and nearly flush with the surrounding tissues. Also with slough and eschar buildup. 06/10/2022: The right sided wound appears [name] be nearly closed, if not completely so, although it is somewhat difficult [name] tell given the abnormal tissue and scarring in his foot. There is a fair amount of callus and crust accumulation. On the left, the wound looks about the same, again with callus and slough. He has an appointment next Thursday with Dr. Annamary Rummage in podiatry; I am hopeful that there may be some reconstructive options available for Mr. Steward. 06/16/2022: Both wounds have some eschar and callus accumulation. The right sided wound is extremely narrow and barely open; the left is narrower than last week. There is a little bit of slough. He has his appointment in podiatry later today. Patient History Information obtained from Patient. Family History Cancer - Maternal Grandparents, Diabetes - Father,Paternal Grandparents, Heart Disease - Maternal Grandparents, Hypertension - Father,Paternal Grandparents, Lung Disease - Siblings, No family history of  Hereditary Spherocytosis, Kidney Disease, Seizures, Stroke, Thyroid Problems, Tuberculosis. Social History Never smoker, Marital Status - Married, Alcohol Use - Never, Drug Use - No History, Caffeine Use - Daily - tea, soda. Medical History Eyes Denies history of Cataracts, Glaucoma, Optic Neuritis Ear/Nose/Mouth/Throat Denies history of Chronic sinus problems/congestion, Middle ear problems Hematologic/Lymphatic Denies history of Anemia, Hemophilia, Human Immunodeficiency Virus, Lymphedema, Sickle Cell Disease Respiratory Patient has history of Asthma Denies history of Aspiration, Chronic Obstructive Pulmonary Disease (COPD), Pneumothorax, Sleep Apnea, Tuberculosis Cardiovascular Patient has history of Angina - with COVID, Hypertension Denies history of Arrhythmia, Congestive Heart Failure, Coronary Artery Disease, Deep Vein Thrombosis, Hypotension, Myocardial Infarction, Peripheral Arterial Disease, Peripheral Venous Disease, Phlebitis, Vasculitis  Gastrointestinal Denies history of Cirrhosis , Colitis, Crohnoos, Hepatitis A, Hepatitis B, Hepatitis C Endocrine Denies history of Type I Diabetes, Type II Diabetes Genitourinary Denies history of End Stage Renal Disease Immunological Denies history of Lupus Erythematosus, Raynaudoos, Scleroderma Integumentary (Skin) Denies history of History of Burn Musculoskeletal Denies history of Gout, Rheumatoid Arthritis, Osteoarthritis, Osteomyelitis Neurologic Denies history of Dementia, Neuropathy, Quadriplegia, Paraplegia, Seizure Disorder Oncologic Denies history of Received Chemotherapy, Received Radiation Psychiatric Denies history of Anorexia/bulimia, Confinement Anxiety Hospitalization/Surgery History - COVID PNA 07/22/2019- 11/14/2019. - 03/27/2020 wound debridement/ skin graft. Medical A Surgical History Notes nd Constitutional Symptoms (General Health) COVID PNA 07/22/2019-11/14/2019 [facility] ECMO, foot drop left  foot , Genitourinary kidney stone Psychiatric anxiety Objective Constitutional No acute distress.. Vitals Time Taken: 9:45 AM, Height: 69 in, Weight: 280 lbs, BMI: 41.3, Temperature: 98.6 F, Pulse: 75 bpm, Respiratory Rate: 16 breaths/min, Blood Pressure: 117/54 mmHg. Respiratory Normal work of breathing on room air.Marland Kitchen [name], [name] ([id number]) [id number] Page 13 of 16 General Notes: 06/16/2022: Both wounds have some eschar and callus accumulation. The right sided wound is extremely narrow and barely open; the left is narrower than last week. There is a little bit of slough. Integumentary (Hair, Skin) Wound #1 status is Open. Original cause of wound was Pressure Injury. The date acquired was: 10/07/2019. The wound has been in treatment 100 weeks. The wound is located on the Right Calcaneus. The wound measures 0.1cm length x 0.1cm width x 0.1cm depth; 0.008cm^2 area and 0.001cm^3 volume. There is Fat Layer (Subcutaneous Tissue) exposed. There is no tunneling or undermining noted. There is a medium amount of serosanguineous drainage noted. The wound margin is thickened. There is small (1-33%) pink granulation within the wound bed. There is a large (67-100%) amount of necrotic tissue within the wound bed including Eschar and Adherent Slough. The periwound skin appearance had no abnormalities noted for moisture. The periwound skin appearance had no abnormalities noted for color. The periwound skin appearance exhibited: Callus. Periwound temperature was noted as No Abnormality. Wound #2 status is Open. Original cause of wound was Pressure Injury. The date acquired was: 10/07/2019. The wound has been in treatment 100 weeks. The wound is located on the Left Calcaneus. The wound measures 3.5cm length x 0.6cm width x 0.2cm depth; 1.649cm^2 area and 0.33cm^3 volume. There is Fat Layer (Subcutaneous Tissue) exposed. There is no tunneling or undermining noted. There is a  medium amount of serosanguineous drainage noted. The wound margin is thickened. There is large (67-100%) red granulation within the wound bed. There is a small (1-33%) amount of necrotic tissue within the wound bed including Eschar and Adherent Slough. The periwound skin appearance had no abnormalities noted for color. The periwound skin appearance exhibited: Callus, Scarring, Maceration. Periwound temperature was noted as No Abnormality. Assessment Active Problems ICD-10 Non-pressure chronic ulcer of other part of right foot with fat layer exposed Non-pressure chronic ulcer of other part of left foot with fat layer exposed Procedures Wound #1 Pre-procedure diagnosis of Wound #1 is a Pressure Ulcer located on the Right Calcaneus . There was a Selective/Open Wound Non-Viable Tissue Debridement with a total area of 0.01 sq cm performed by [name], MD. With the following instrument(s): Curette [name] remove Non-Viable tissue/material. Material removed includes Eschar, Callus, and Slough after achieving pain control using Lidocaine 5% topical ointment. No specimens were taken. A time out was conducted at 10:19, prior [name] the start of the procedure. A Minimum amount of bleeding was controlled with Pressure. The procedure  was tolerated well with a pain level of 0 throughout and a pain level of 0 following the procedure. Post Debridement Measurements: 0.1cm length x 0.1cm width x 0.1cm depth; 0.001cm^3 volume. Post debridement Stage noted as Category/Stage III. Character of Wound/Ulcer Post Debridement is improved. Post procedure Diagnosis Wound #1: Same as Pre-Procedure General Notes: Scribed for Dr. Lady Gary by J.Scotton. Wound #2 Pre-procedure diagnosis of Wound #2 is a Pressure Ulcer located on the Left Calcaneus . There was a Selective/Open Wound Non-Viable Tissue Debridement with a total area of 2.1 sq cm performed by [name], MD. With the following instrument(s): Curette [name] remove  Non-Viable tissue/material. Material removed includes Eschar, Callus, and Slough after achieving pain control using Lidocaine 5% topical ointment. No specimens were taken. A time out was conducted at 10:19, prior [name] the start of the procedure. A Minimum amount of bleeding was controlled with Pressure. The procedure was tolerated well with a pain level of 0 throughout and a pain level of 0 following the procedure. Post Debridement Measurements: 3.5cm length x 0.6cm width x 0.2cm depth; 0.33cm^3 volume. Post debridement Stage noted as Category/Stage III. Character of Wound/Ulcer Post Debridement is improved. Post procedure Diagnosis Wound #2: Same as Pre-Procedure General Notes: Scribed for Dr. Lady Gary by J.Scotton. Plan Follow-up Appointments: Return Appointment in 1 week. - Dr. Lady Gary Room 3 11/16 at 9:30 AM 11/22 at 10:15 AM Other: - After appointment with [facility], put [facility] and Endoform on both heel wounds. Anesthetic: Wound #2 Left Calcaneus: (In clinic) Topical Lidocaine 4% applied [name] wound bed - In clinic Bathing/ Shower/ Hygiene: May shower with protection but do not get wound dressing(s) wet. - Cover with cast protector (can purchase cast protector at [facility] or [facility] ) Edema Control - Lymphedema / SCD / Other: Elevate legs [name] the level of the heart or above for 30 minutes daily and/or when sitting, a frequency of: - throughout the day Avoid standing for long periods of time. Moisturize legs daily. - right leg and foot every night. Off-Loading: T Contact Cast [name] Left Lower Extremity - TCC [name] Left LL (T Cast on hold this week-06/16/22) otal otal Other: - keep pressure off of the bottom of your feet. Globoped [name] left foot-when not wearing the cast Additional Orders / Instructions: Follow Nutritious Diet Non Wound Condition: Other Non Wound Condition Orders/Instructions: - Right Heel-Use Opifoam and Border Foam and Kerlix/Medipore soft cloth surgical tape (4x10 in/yd) for  Non wound on Right Heel. WOUND #1: - Calcaneus Wound Laterality: Right Cleanser: Normal Saline (Generic) Every Other Day/30 Days Discharge Instructions: Cleanse the wound with Normal Saline prior [name] applying a clean dressing using gauze sponges, not tissue or cotton balls. MITSUGI, SCHRADER ([id number]) [id number] Page 14 of 16 Cleanser: Wound Cleanser (Generic) Every Other Day/30 Days Discharge Instructions: Cleanse the wound with wound cleanser prior [name] applying a clean dressing using gauze sponges, not tissue or cotton balls. Prim Dressing: Endoform 2x2 in (Generic) Every Other Day/30 Days ary Discharge Instructions: Moisten with saline Secondary Dressing: ALLEVYN Heel 4 1/2in x 5 1/2in / 10.5cm x 13.5cm (Generic) Every Other Day/30 Days Discharge Instructions: Apply over primary dressing as directed. Secondary Dressing: Woven Gauze Sponge, Non-Sterile 4x4 in Every Other Day/30 Days Discharge Instructions: Apply over primary dressing as directed. Secured With: Insurance underwriter, Sterile 2x75 (in/in) (Generic) Every Other Day/30 Days Discharge Instructions: Secure with stretch gauze as directed. Secured With: 73M Medipore H Soft Cloth Surgical T ape, 4 x 10 (in/yd) (Generic) Every  Other Day/30 Days Discharge Instructions: Secure with tape as directed. WOUND #2: - Calcaneus Wound Laterality: Left Cleanser: Normal Saline (Generic) 1 x Per Week/30 Days Discharge Instructions: Cleanse the wound with Normal Saline prior [name] applying a clean dressing using gauze sponges, not tissue or cotton balls. Cleanser: Wound Cleanser 1 x Per Week/30 Days Discharge Instructions: Cleanse the wound with wound cleanser prior [name] applying a clean dressing using gauze sponges, not tissue or cotton balls. Topical: TCC 1 x Per Week/30 Days Discharge Instructions: [name] left leg (on hold 06/16/22) Topical: keystone 1 x Per Week/30 Days Discharge Instructions: use after  appointment with [facility] Prim Dressing: Endoform 2x2 in 1 x Per Week/30 Days ary Discharge Instructions: Moisten with saline. Put on after appt. with [facility]11/9/23 Secondary Dressing: Optifoam Non-Adhesive Dressing, 4x4 in (Generic) 1 x Per Week/30 Days Discharge Instructions: Apply over primary dressing as directed. Secondary Dressing: Woven Gauze Sponge, Non-Sterile 4x4 in 1 x Per Week/30 Days Discharge Instructions: Apply over primary dressing as directed. Secured With: 65M Medipore H Soft Cloth Surgical T ape, 4 x 10 (in/yd) (Generic) 1 x Per Week/30 Days Discharge Instructions: Secure with tape as directed. 06/16/2022: Both wounds have some eschar and callus accumulation. The right sided wound is extremely narrow and barely open; the left is narrower than last week. There is a little bit of slough. I used a curette [name] debride slough, eschar, and callus from both feet. We will apply simple dressings here in clinic. After his visit in podiatry, they will apply endoform with his [name] Echevaria topical compounded antibiotic every other day. They have a follow-up appointment with me next week. We will reassess at that time whether or not we need [name] continue total contact casting. I am very hopeful that there may be some reconstructive possibilities offered by Dr. Annamary Rummage. Electronic Signature(s) Signed: 06/16/2022 10:40:34 AM By: [name] MD FACS Entered By: [name] on 06/16/2022 10:40:34 -------------------------------------------------------------------------------- HxROS Details Patient Name: Date of Service: [name], [name] [place] E. 06/16/2022 9:30 A M Medical Record Number: [id number] Patient Account Number: [id number] Date of Birth/Sex: Treating RN: [birth year] (46 y.o. M) Primary Care Provider: Dorinda Hill Other Clinician: Referring Provider: Treating Provider/Extender: Jana Hakim in Treatment: 100 Information Obtained  From Patient Constitutional Symptoms (General Health) Medical History: Past Medical History Notes: COVID PNA 07/22/2019-11/14/2019 [facility] ECMO, foot drop left foot , Eyes Medical History: Negative for: Cataracts; Glaucoma; Optic Neuritis Ear/Nose/Mouth/Throat Medical History: Negative for: Chronic sinus problems/congestion; Middle ear problems [name], [name] ([id number]) [id number] Page 15 of 16 Hematologic/Lymphatic Medical History: Negative for: Anemia; Hemophilia; Human Immunodeficiency Virus; Lymphedema; Sickle Cell Disease Respiratory Medical History: Positive for: Asthma Negative for: Aspiration; Chronic Obstructive Pulmonary Disease (COPD); Pneumothorax; Sleep Apnea; Tuberculosis Cardiovascular Medical History: Positive for: Angina - with COVID; Hypertension Negative for: Arrhythmia; Congestive Heart Failure; Coronary Artery Disease; Deep Vein Thrombosis; Hypotension; Myocardial Infarction; Peripheral Arterial Disease; Peripheral Venous Disease; Phlebitis; Vasculitis Gastrointestinal Medical History: Negative for: Cirrhosis ; Colitis; Crohns; Hepatitis A; Hepatitis B; Hepatitis C Endocrine Medical History: Negative for: Type I Diabetes; Type II Diabetes Genitourinary Medical History: Negative for: End Stage Renal Disease Past Medical History Notes: kidney stone Immunological Medical History: Negative for: Lupus Erythematosus; Raynauds; Scleroderma Integumentary (Skin) Medical History: Negative for: History of Burn Musculoskeletal Medical History: Negative for: Gout; Rheumatoid Arthritis; Osteoarthritis; Osteomyelitis Neurologic Medical History: Negative for: Dementia; Neuropathy; Quadriplegia; Paraplegia; Seizure Disorder Oncologic Medical History: Negative for: Received Chemotherapy; Received Radiation Psychiatric Medical History: Negative for: Anorexia/bulimia;  Confinement Anxiety Past Medical History  Notes: anxiety Immunizations Pneumococcal Vaccine: Received Pneumococcal Vaccination: No Implantable Devices None Hospitalization / Surgery History Type of Hospitalization/Surgery COVID PNA 07/22/2019- 11/14/2019 [name] ([id number]) [id number] Page 16 of 16 03/27/2020 wound debridement/ skin graft Family and Social History Cancer: Yes - Maternal Grandparents; Diabetes: Yes - Father,Paternal Grandparents; Heart Disease: Yes - Maternal Grandparents; Hereditary Spherocytosis: No; Hypertension: Yes - Father,Paternal Grandparents; Kidney Disease: No; Lung Disease: Yes - Siblings; Seizures: No; Stroke: No; Thyroid Problems: No; Tuberculosis: No; Never smoker; Marital Status - Married; Alcohol Use: Never; Drug Use: No History; Caffeine Use: Daily - tea, soda; Financial Concerns: No; Food, Clothing or Shelter Needs: No; Support System Lacking: No; Transportation Concerns: No Electronic Signature(s) Signed: 06/16/2022 12:49:09 PM By: [name] MD FACS Entered By: [name] on 06/16/2022 10:38:50 -------------------------------------------------------------------------------- SuperBill Details Patient Name: Date of Service: [name], [name] [place] E. 06/16/2022 Medical Record Number: [id number] Patient Account Number: [id number] Date of Birth/Sex: Treating RN: 10/06/1975 (46 y.o. M) Primary Care Provider: Dorinda Hill Other Clinician: Referring Provider: Treating Provider/Extender: Jana Hakim in Treatment: 100 Diagnosis Coding ICD-10 Codes Code Description [phone] Non-pressure chronic ulcer of other part of right foot with fat layer exposed L97.522 Non-pressure chronic ulcer of other part of left foot with fat layer exposed Facility Procedures : CPT4 Code: [id number] Description: 97597 - DEBRIDE WOUND 1ST 20 SQ CM OR < ICD-10 Diagnosis Description L97.512 Non-pressure chronic ulcer of other part of right foot with fat layer  exposed L97.522 Non-pressure chronic ulcer of other part of left foot with fat layer exposed Modifier: Quantity: 1 Physician Procedures : CPT4 Code Description Modifier [id number] - WC PHYS LEVEL 4 - EST PT 25 ICD-10 Diagnosis Description L97.512 Non-pressure chronic ulcer of other part of right foot with fat layer exposed L97.522 Non-pressure chronic ulcer of other part of left foot  with fat layer exposed Quantity: 1 : [id number] 97597 - WC PHYS DEBR WO ANESTH 20 SQ CM ICD-10 Diagnosis Description L97.512 Non-pressure chronic ulcer of other part of right foot with fat layer exposed L97.522 Non-pressure chronic ulcer of other part of left foot with fat layer exposed Quantity: 1 Electronic Signature(s) Signed: 06/16/2022 10:40:52 AM By: [name] MD FACS Entered By: [name] on 06/16/2022 10:40:51

## 2022-06-17 NOTE — Progress Notes (Signed)
TAJAY, MUZZY ([id number]) [id number] Page 1 of 9 Visit Report for 06/16/2022 Arrival Information Details Patient Name: Date of Service: [name], [name] [name] 06/16/2022 9:30 A M Medical Record Number: [id number] Patient Account Number: [id number] Date of Birth/Sex: Treating RN: August 10, 1975 (46 y.o. [name] Primary Care Presleigh Feldstein: Dorinda Hill Other Clinician: Referring Cobie Leidner: Treating Elai Vanwyk/Extender: Jana Hakim in Treatment: 100 Visit Information History Since Last Visit Added or deleted any medications: No Patient Arrived: Wheel Chair Any new allergies or adverse reactions: No Arrival Time: 09:40 Had a fall or experienced change in No Accompanied By: spouse activities of daily living that may affect Transfer Assistance: None risk of falls: Patient Identification Verified: Yes Signs or symptoms of abuse/neglect since last visito No Patient Requires Transmission-Based Precautions: No Hospitalized since last visit: No Patient Has Alerts: No Implantable device outside of the clinic excluding No cellular tissue based products placed in the center since last visit: Has Dressing in Place as Prescribed: Yes Pain Present Now: No Electronic Signature(s) Signed: 06/16/2022 5:34:50 PM By: [name] RN Entered By: [name] on 06/16/2022 10:07:51 -------------------------------------------------------------------------------- Encounter Discharge Information Details Patient Name: Date of Service: [name], [name] [place] E. 06/16/2022 9:30 A M Medical Record Number: [id number] Patient Account Number: [id number] Date of Birth/Sex: Treating RN: 12-11-1975 (46 y.o. [name] Primary Care Terrilyn Tyner: Dorinda Hill Other Clinician: Referring Amel Kitch: Treating Ayahna Solazzo/Extender: Jana Hakim in Treatment: 100 Encounter Discharge Information Items Post Procedure Vitals Discharge Condition:  Stable Temperature (F): 98.6 Ambulatory Status: Wheelchair Pulse (bpm): 75 Discharge Destination: Home Respiratory Rate (breaths/min): 16 Transportation: Private Auto Blood Pressure (mmHg): 117/54 Accompanied By: spouse Schedule Follow-up Appointment: Yes Clinical Summary of Care: Patient Declined Electronic Signature(s) Signed: 06/16/2022 5:34:50 PM By: [name] RN Entered By: [name] on 06/16/2022 17:26:42 [name] ([id number]) [id number] Page 2 of 9 -------------------------------------------------------------------------------- Lower Extremity Assessment Details Patient Name: Date of Service: [name] [name] 06/16/2022 9:30 A M Medical Record Number: [id number] Patient Account Number: [id number] Date of Birth/Sex: Treating RN: 1976/05/10 (46 y.o. [name] Primary Care Derica Leiber: Dorinda Hill Other Clinician: Referring Davanna He: Treating Livie Vanderhoof/Extender: Jana Hakim in Treatment: 100 Edema Assessment Assessed: [name]: No] [Right: No] Edema: [Left: N] [Right: o] Calf Left: Right: Point of Measurement: 29 cm From Medial Instep 42 cm 43 cm Ankle Left: Right: Point of Measurement: 9 cm From Medial Instep 24.5 cm 24.2 cm Vascular Assessment Pulses: Dorsalis Pedis Palpable: [Left:Yes] [Right:Yes] Electronic Signature(s) Signed: 06/16/2022 5:34:50 PM By: [name] RN Entered By: [name] on 06/16/2022 10:10:20 -------------------------------------------------------------------------------- Multi Wound Chart Details Patient Name: Date of Service: [name], [name] [place] E. 06/16/2022 9:30 A M Medical Record Number: [id number] Patient Account Number: [id number] Date of Birth/Sex: Treating RN: 1976-03-02 (46 y.o. M) Primary Care Cynethia Schindler: Dorinda Hill Other Clinician: Referring [name] Mackowski: Treating Tenille Morrill/Extender: Jana Hakim in Treatment: 100 Vital Signs Height(in):  69 Pulse(bpm): 75 Weight(lbs): 280 Blood Pressure(mmHg): 117/54 Body Mass Index(BMI): 41.3 Temperature(F): 98.6 Respiratory Rate(breaths/min): 16 [1:Photos:] [N/A:N/A [id number] Page 3 of 9] Right Calcaneus Left Calcaneus N/A Wound Location: Pressure Injury Pressure Injury N/A Wounding Event: Pressure Ulcer Pressure Ulcer N/A Primary Etiology: Asthma, Angina, Hypertension Asthma, Angina, Hypertension N/A Comorbid History: 10/07/2019 10/07/2019 N/A Date Acquired: 100 100 N/A Weeks of Treatment: Open Open N/A Wound Status: No No N/A Wound Recurrence: 0.1x0.1x0.1 3.5x0.6x0.2 N/A Measurements L x W x D (cm) 0.008 1.649 N/A A (cm) : rea 0.001 0.33 N/A  Volume (cm) : 100.00% 93.90% N/A % Reduction in A rea: 100.00% 98.80% N/A % Reduction in Volume: Category/Stage III Category/Stage III N/A Classification: Medium Medium N/A Exudate A mount: Serosanguineous Serosanguineous N/A Exudate Type: red, brown red, brown N/A Exudate Color: Thickened Thickened N/A Wound Margin: Small (1-33%) Large (67-100%) N/A Granulation A mount: Pink Red N/A Granulation Quality: Large (67-100%) Small (1-33%) N/A Necrotic A mount: Eschar, Adherent Slough Eschar, Adherent Slough N/A Necrotic Tissue: Fat Layer (Subcutaneous Tissue): Yes Fat Layer (Subcutaneous Tissue): Yes N/A Exposed Structures: Fascia: No Fascia: No Tendon: No Tendon: No Muscle: No Muscle: No Joint: No Joint: No Bone: No Bone: No Large (67-100%) Small (1-33%) N/A Epithelialization: Debridement - Selective/Open Wound Debridement - Selective/Open Wound N/A Debridement: Pre-procedure Verification/Time Out 10:19 10:19 N/A Taken: Lidocaine 5% topical ointment Lidocaine 5% topical ointment N/A Pain Control: Necrotic/Eschar, Callus, Slough Necrotic/Eschar, Callus, Slough N/A Tissue Debrided: Non-Viable Tissue Non-Viable Tissue N/A Level: 0.01 2.1 N/A Debridement A (sq cm): rea Curette  Curette N/A Instrument: Minimum Minimum N/A Bleeding: Pressure Pressure N/A Hemostasis A chieved: 0 0 N/A Procedural Pain: 0 0 N/A Post Procedural Pain: Procedure was tolerated well Procedure was tolerated well N/A Debridement Treatment Response: 0.1x0.1x0.1 3.5x0.6x0.2 N/A Post Debridement Measurements L x W x D (cm) 0.001 0.33 N/A Post Debridement Volume: (cm) Category/Stage III Category/Stage III N/A Post Debridement Stage: Callus: Yes Callus: Yes N/A Periwound Skin Texture: Scarring: Yes No Abnormalities Noted Maceration: Yes N/A Periwound Skin Moisture: No Abnormalities Noted No Abnormalities Noted N/A Periwound Skin Color: No Abnormality No Abnormality N/A Temperature: Debridement Debridement N/A Procedures Performed: Treatment Notes Electronic Signature(s) Signed: 06/16/2022 10:33:34 AM By: [name] MD FACS Entered By: [name] on 06/16/2022 10:33:34 -------------------------------------------------------------------------------- Multi-Disciplinary Care Plan Details Patient Name: Date of Service: [name], [name] [place] E. 06/16/2022 9:30 A M Medical Record Number: [id number] Patient Account Number: [id number] Date of Birth/Sex: Treating RN: [birth year] (46 y.o. [name] Primary Care Dene Landsberg: Dorinda Hill Other Clinician: Referring Zyaire Mccleod: Treating Morley Gaumer/Extender: Jana Hakim in Treatment: 100 Multidisciplinary Care Plan reviewed with physician [address] JUANITA, DEVINCENT ([id number]) [id number] Page 4 of 9 Wound/Skin Impairment Nursing Diagnoses: Impaired tissue integrity Knowledge deficit related [name] ulceration/compromised skin integrity Goals: Patient/caregiver will verbalize understanding of skin care regimen Date Initiated: 07/15/2020 Target Resolution Date: 03/08/2023 Goal Status: Active Ulcer/skin breakdown will have a volume reduction of 30% by week 4 Date Initiated:  07/15/2020 Date Inactivated: 08/20/2020 Target Resolution Date: 09/03/2020 Goal Status: Unmet Unmet Reason: no major changes. Ulcer/skin breakdown will heal within 14 weeks Date Initiated: 12/04/2020 Date Inactivated: 12/10/2020 Target Resolution Date: 12/10/2020 Unmet Reason: wounds still open at 14 Goal Status: Unmet weeks and today 21 weeks. Interventions: Assess patient/caregiver ability [name] obtain necessary supplies Assess patient/caregiver ability [name] perform ulcer/skin care regimen upon admission and as needed Assess ulceration(s) every visit Provide education on ulcer and skin care Treatment Activities: Skin care regimen initiated : 07/15/2020 Topical wound management initiated : 07/15/2020 Notes: Electronic Signature(s) Signed: 06/16/2022 5:34:50 PM By: [name] RN Entered By: [name] on 06/16/2022 17:25:26 -------------------------------------------------------------------------------- Pain Assessment Details Patient Name: Date of Service: [name], [name] [name] 06/16/2022 9:30 A M Medical Record Number: [id number] Patient Account Number: [id number] Date of Birth/Sex: Treating RN: [birth year] (46 y.o. [name] Primary Care Marrah Vanevery: Dorinda Hill Other Clinician: Referring Ryaan Vanwagoner: Treating Kharee Lesesne/Extender: Jana Hakim in Treatment: 100 Active Problems Location of Pain Severity and Description of Pain Patient Has Paino No Site Locations [place], [place]  E ([id number]) [id number] Page 5 of 9 Pain Management and Medication Current Pain Management: Electronic Signature(s) Signed: 06/16/2022 5:34:50 PM By: [name] RN Entered By: [name] on 06/16/2022 10:08:20 -------------------------------------------------------------------------------- Patient/Caregiver Education Details Patient Name: Date of Service: [name], [name] 11/9/2023andnbsp9:30 A M Medical Record Number: [id number] Patient Account  Number: [id number] Date of Birth/Gender: Treating RN: Jan 12, 1976 (46 y.o. [name] Primary Care Physician: Dorinda Hill Other Clinician: Referring Physician: Treating Physician/Extender: Jana Hakim in Treatment: 100 Education Assessment Education Provided [name]: Patient Education Topics Provided Wound/Skin Impairment: Methods: Explain/Verbal Responses: Return demonstration correctly Electronic Signature(s) Signed: 06/16/2022 5:34:50 PM By: [name] RN Entered By: [name] on 06/16/2022 17:25:41 -------------------------------------------------------------------------------- Wound Assessment Details Patient Name: Date of Service: [name], [name] [name] 06/16/2022 9:30 A M Medical Record Number: [id number] Patient Account Number: [id number] Date of Birth/Sex: Treating RN: 1975-11-12 (46 y.o. [name] Primary Care Jilliann Subramanian: Dorinda Hill Other Clinician: Referring Yazhini Mcaulay: Treating Rossanna Spitzley/Extender: Jana Hakim in Treatment: 100 Wound Status Wound Number: 1 Primary Etiology: Pressure Ulcer Wound Location: Right Calcaneus Wound Status: Open Wounding Event: Pressure Injury Comorbid History: Asthma, Angina, Hypertension Date Acquired: 10/07/2019 Weeks Of Treatment: 100 Clustered Wound: No Photos [name], [name] ([id number]) [id number] Page 6 of 9 Wound Measurements Length: (cm) 0.1 Width: (cm) 0.1 Depth: (cm) 0.1 Area: (cm) 0.008 Volume: (cm) 0.001 % Reduction in Area: 100% % Reduction in Volume: 100% Epithelialization: Large (67-100%) Tunneling: No Undermining: No Wound Description Classification: Category/Stage III Wound Margin: Thickened Exudate Amount: Medium Exudate Type: Serosanguineous Exudate Color: red, brown Foul Odor After Cleansing: No Slough/Fibrino Yes Wound Bed Granulation Amount: Small (1-33%) Exposed Structure Granulation Quality: Pink Fascia Exposed:  No Necrotic Amount: Large (67-100%) Fat Layer (Subcutaneous Tissue) Exposed: Yes Necrotic Quality: Eschar, Adherent Slough Tendon Exposed: No Muscle Exposed: No Joint Exposed: No Bone Exposed: No Periwound Skin Texture Texture Color No Abnormalities Noted: No No Abnormalities Noted: Yes Callus: Yes Temperature / Pain Temperature: No Abnormality Moisture No Abnormalities Noted: Yes Treatment Notes Wound #1 (Calcaneus) Wound Laterality: Right Cleanser Normal Saline Discharge Instruction: Cleanse the wound with Normal Saline prior [name] applying a clean dressing using gauze sponges, not tissue or cotton balls. Wound Cleanser Discharge Instruction: Cleanse the wound with wound cleanser prior [name] applying a clean dressing using gauze sponges, not tissue or cotton balls. Peri-Wound Care Topical Primary Dressing Endoform 2x2 in Discharge Instruction: Moisten with saline Secondary Dressing ALLEVYN Heel 4 1/2in x 5 1/2in / 10.5cm x 13.5cm Discharge Instruction: Apply over primary dressing as directed. Woven Gauze Sponge, Non-Sterile 4x4 in Discharge Instruction: Apply over primary dressing as directed. Secured With Conforming Stretch Gauze Bandage, Sterile 2x75 (in/in) Discharge Instruction: Secure with stretch gauze as directed. 4M Medipore H Soft Cloth Surgical T ape, 4 x 10 (in/yd) Discharge Instruction: Secure with tape as directed. [name], [name] ([id number]) [id number] Page 7 of 9 Compression Wrap Compression Stockings Add-Ons Electronic Signature(s) Signed: 06/16/2022 5:34:50 PM By: [name] RN Entered By: [name] on 06/16/2022 10:15:19 -------------------------------------------------------------------------------- Wound Assessment Details Patient Name: Date of Service: [name], [name] [name] 06/16/2022 9:30 A M Medical Record Number: [id number] Patient Account Number: [id number] Date of Birth/Sex: Treating RN: [birth year] (46 y.o. [name] Primary Care Seila Liston: Dorinda Hill Other Clinician: Referring Aayliah Rotenberry: Treating Rowdy Guerrini/Extender: Jana Hakim in Treatment: 100 Wound Status Wound Number: 2 Primary Etiology: Pressure Ulcer Wound Location: Left Calcaneus Wound Status: Open Wounding Event: Pressure Injury Comorbid History:  Asthma, Angina, Hypertension Date Acquired: 10/07/2019 Weeks Of Treatment: 100 Clustered Wound: No Photos Wound Measurements Length: (cm) 3.5 Width: (cm) 0.6 Depth: (cm) 0.2 Area: (cm) 1.649 Volume: (cm) 0.33 % Reduction in Area: 93.9% % Reduction in Volume: 98.8% Epithelialization: Small (1-33%) Tunneling: No Undermining: No Wound Description Classification: Category/Stage III Wound Margin: Thickened Exudate Amount: Medium Exudate Type: Serosanguineous Exudate Color: red, brown Foul Odor After Cleansing: No Slough/Fibrino Yes Wound Bed Granulation Amount: Large (67-100%) Exposed Structure Granulation Quality: Red Fascia Exposed: No Necrotic Amount: Small (1-33%) Fat Layer (Subcutaneous Tissue) Exposed: Yes Necrotic Quality: Eschar, Adherent Slough Tendon Exposed: No Muscle Exposed: No Joint Exposed: No Bone Exposed: No [address] [name], [name] ([id number]) [id number] Page 8 of 9 No Abnormalities Noted: No No Abnormalities Noted: Yes Callus: Yes Temperature / Pain Scarring: Yes Temperature: No Abnormality Moisture No Abnormalities Noted: No Maceration: Yes Treatment Notes Wound #2 (Calcaneus) Wound Laterality: Left Cleanser Normal Saline Discharge Instruction: Cleanse the wound with Normal Saline prior [name] applying a clean dressing using gauze sponges, not tissue or cotton balls. Wound Cleanser Discharge Instruction: Cleanse the wound with wound cleanser prior [name] applying a clean dressing using gauze sponges, not tissue or cotton balls. Peri-Wound  Care Topical TCC Discharge Instruction: [name] left leg (on hold 06/16/22) keystone Discharge Instruction: use after appointment with [facility] Primary Dressing Endoform 2x2 in Discharge Instruction: Moisten with saline. Put on after appt. with [facility]11/9/23 Secondary Dressing Optifoam Non-Adhesive Dressing, 4x4 in Discharge Instruction: Apply over primary dressing as directed. Woven Gauze Sponge, Non-Sterile 4x4 in Discharge Instruction: Apply over primary dressing as directed. Secured With 38M Medipore H Soft Cloth Surgical T ape, 4 x 10 (in/yd) Discharge Instruction: Secure with tape as directed. Compression Wrap Compression Stockings Add-Ons Electronic Signature(s) Signed: 06/16/2022 5:34:50 PM By: [name] RN Entered By: [name] on 06/16/2022 10:15:57 -------------------------------------------------------------------------------- Vitals Details Patient Name: Date of Service: [name], [name] [place] E. 06/16/2022 9:30 A M Medical Record Number: [id number] Patient Account Number: [id number] Date of Birth/Sex: Treating RN: [birth year] (46 y.o. [name] Primary Care Evadene Wardrip: Dorinda Hill Other Clinician: Referring Raelyn Racette: Treating Kikue Gerhart/Extender: Jana Hakim in Treatment: 100 Vital Signs Time Taken: 09:45 Temperature (F): 98.6 Height (in): 69 Pulse (bpm): 75 Weight (lbs): 280 Respiratory Rate (breaths/min): 16 Body Mass Index (BMI): 41.3 Blood Pressure (mmHg): 117/54 Reference Range: 80 - 120 mg / dl [name], [name] ([id number]) [id number] Page 9 of 9 Electronic Signature(s) Signed: 06/16/2022 5:34:50 PM By: [name] RN Entered By: [name] on 06/16/2022 10:08:13

## 2022-06-17 NOTE — Progress Notes (Signed)
Subjective:  Patient ID: [name], male    DOB: 1976-05-30,  MRN: [id number]  Chief Complaint  Patient presents with   Wound Check    Wound to bilateral heels. Onset was about a year ago. Patient has been changing dressing every other day. Patient is not diabetic.     46 y.o. male presents with concern for bilateral plantar heel wounds.  He has had them for a very long time approximately 2 years.  He got them as a result of having COVID and being hospitalized for extended period of time.  During the hospitalization he was in a bed that tilted almost vertical and he had a pressure on his heels on the bottom which caused wounds to develop.  He has been going to wound care.  He has been seeing them at least once a week for the past year and 1/2 to 2 years.  He says that the wounds have been improving the right especially though the left has been lagging behind.  He was referred from the wound care center.  They were hoping that I would be able to do some type of surgical intervention to help heal the wounds.  He has been getting total contact casting as well as antibiotic topical paste that he has been applying to the wounds.  He does state they are improving though very slowly.  He has lost a lot of the fat pad on the bilateral plantar heel.  Has decreased his weightbearing amount is much as possible.  He does a dressing change every other day with endoform.  Past Medical History:  Diagnosis Date   Anginal pain (HCC)    with covid   Anxiety    Asthma    Dyspnea    GERD (gastroesophageal reflux disease)    Headache    History of kidney stones    LEFT URETERAL STONE   HTN (hypertension)    Pancreatitis 2018   GALLBALDDER SLUDGE CAUSED ISSUED RESOLVED   Pneumonia 07/2019   covid    No Known Allergies  ROS: Negative except as per HPI above  Objective:  General: AAO x3, NAD  Dermatological: Bilateral plantar wounds left foot worse than right.  There is significant fat pad atrophy  bilateral heels with firm area of palpation at the plantar ulceration.  No significant drainage erythema malodor or other signs of infection.  The wounds do appear healthy at this time.  There is fissures running from the central heel up towards the lateral aspect of the arch bilaterally.  Vascular:  Dorsalis Pedis artery and Posterior Tibial artery pedal pulses are 2/4 bilateral.  Capillary fill time < 3 sec to all digits.   Neruologic: Grossly intact via light touch bilateral. Protective threshold intact to all sites bilateral.   Musculoskeletal: Fat pad atrophy flatfoot deformity noted bilaterally  Gait: Unassisted, Nonantalgic.      Radiographs:  Date: 06/16/2022 XR the left foot Weightbearing AP/Lateral/Oblique   Findings: no soft tissue emphysema, no signs of osteomyelitis at the plantar aspect of the calcaneus.  There is a large plantar heel spur present on the left foot. Assessment:   1. Skin ulcer of left heel with fat layer exposed (HCC)   2. Skin ulcer of right heel with fat layer exposed (HCC)   3. Pressure injury of left heel, unstageable (HCC)      Plan:  Patient was evaluated and treated and all questions answered.  #Bilateral ulceration to the plantar aspect of the heel left  foot worse than right, concern for possible osteomyelitis left plantar calcaneus -Discussed with the pt that overall his wounds appear relatively stable -Recommend he continue with wound care recommendations per the wound care center and continue to see them. -Unfortunately I do not have any surgical correction that would provide significant relief to the patient or help his wounds heal faster.  Do not feel a skin graft or skin substitute or wound VAC would provide any benefit given the nature of the wounds as they currently are.  He is facing a difficult process of these healing regarding the location of the wound and direct plantar pressure on the area. -I would like to send the patient for an MRI  of the left foot to evaluate for possible underlying osteomyelitis which could be preventing the wound from fully healing.  The wound does appear to be relatively deep towards the plantar aspect of the calcaneus. -Patient will follow-up in approximately 4 weeks to go over the MRI results and discuss if there are any further treatment options I recommend otherwise we will have to continue with local wound care and restricted weightbearing as much as he can tolerate.  Return in about 4 weeks (around 07/14/2022) for Follow-up MRI left foot.          Corinna Gab, DPM [facility] / [facility]

## 2022-06-21 ENCOUNTER — Ambulatory Visit
Admission: RE | Admit: 2022-06-21 | Discharge: 2022-06-21 | Disposition: A | Discharge status: Home / Self Care | Payer: PPO | Source: Ambulatory Visit | Attending: Podiatry | Admitting: Podiatry

## 2022-06-21 DIAGNOSIS — S91302A Unspecified open wound, left foot, initial encounter: Secondary | ICD-10-CM | POA: Diagnosis not present

## 2022-06-21 DIAGNOSIS — L97422 Non-pressure chronic ulcer of left heel and midfoot with fat layer exposed: Secondary | ICD-10-CM

## 2022-06-21 DIAGNOSIS — Z872 Personal history of diseases of the skin and subcutaneous tissue: Secondary | ICD-10-CM | POA: Diagnosis not present

## 2022-06-23 ENCOUNTER — Encounter (HOSPITAL_BASED_OUTPATIENT_CLINIC_OR_DEPARTMENT_OTHER): Payer: PPO | Admitting: General Surgery

## 2022-06-23 DIAGNOSIS — L89623 Pressure ulcer of left heel, stage 3: Secondary | ICD-10-CM | POA: Diagnosis not present

## 2022-06-23 DIAGNOSIS — L89613 Pressure ulcer of right heel, stage 3: Secondary | ICD-10-CM | POA: Diagnosis not present

## 2022-06-23 DIAGNOSIS — L97512 Non-pressure chronic ulcer of other part of right foot with fat layer exposed: Secondary | ICD-10-CM | POA: Diagnosis not present

## 2022-06-24 NOTE — Progress Notes (Signed)
[name], [name] ([id number]) [id number] Page 1 of 9 Visit Report for 06/23/2022 Arrival Information Details Patient Name: Date of Service: [name], [name] [name] 06/23/2022 9:30 A M Medical Record Number: [id number] Patient Account Number: [id number] Date of Birth/Sex: Treating RN: Sep 29, 1975 (47 y.o. [name] Primary Care Chalet Kerwin: Dorinda Hill Other Clinician: Referring Jessi Pitstick: Treating Ameshia Pewitt/Extender: Jana Hakim in Treatment: 101 Visit Information History Since Last Visit Added or deleted any medications: No Patient Arrived: Wheel Chair Any new allergies or adverse reactions: No Arrival Time: 09:47 Had a fall or experienced change in No Accompanied By: spouse activities of daily living that may affect Transfer Assistance: None risk of falls: Patient Identification Verified: Yes Signs or symptoms of abuse/neglect since last visito No Patient Requires Transmission-Based Precautions: No Hospitalized since last visit: No Patient Has Alerts: No Implantable device outside of the clinic excluding No cellular tissue based products placed in the center since last visit: Has Dressing in Place as Prescribed: Yes Pain Present Now: No Electronic Signature(s) Signed: 06/24/2022 7:46:07 AM By: [name] RN Entered By: [name] on 06/23/2022 09:48:13 -------------------------------------------------------------------------------- Encounter Discharge Information Details Patient Name: Date of Service: [name], [name] [place] E. 06/23/2022 9:30 A M Medical Record Number: [id number] Patient Account Number: [id number] Date of Birth/Sex: Treating RN: [birth year] (46 y.o. [name] Primary Care Elwyn Lowden: Dorinda Hill Other Clinician: Referring Deborha Moseley: Treating Chukwuma Straus/Extender: Jana Hakim in Treatment: [phone] Encounter Discharge Information Items Post Procedure Vitals Discharge Condition:  Stable Temperature (F): 98.3 Ambulatory Status: Wheelchair Pulse (bpm): 76 Discharge Destination: Home Respiratory Rate (breaths/min): 16 Transportation: Private Auto Blood Pressure (mmHg): 121/77 Accompanied By: spouse Schedule Follow-up Appointment: Yes Clinical Summary of Care: Patient Declined Electronic Signature(s) Signed: 06/24/2022 7:46:07 AM By: [name] RN Entered By: [name] on 06/24/2022 07:44:07 [name] ([id number]) [id number] Page 2 of 9 -------------------------------------------------------------------------------- Lower Extremity Assessment Details Patient Name: Date of Service: [name] [name] 06/23/2022 9:30 A M Medical Record Number: [id number] Patient Account Number: [id number] Date of Birth/Sex: Treating RN: 02-Nov-1975 (46 y.o. [name] Primary Care Kamorie Aldous: Dorinda Hill Other Clinician: Referring Temesgen Weightman: Treating Sundiata Ferrick/Extender: Jana Hakim in Treatment: 101 Edema Assessment Assessed: [name]: No] Franne Forts: No] Edema: [Left: N] [Right: o] Calf Left: Right: Point of Measurement: 29 cm From Medial Instep 42.5 cm 44.5 cm Ankle Left: Right: Point of Measurement: 9 cm From Medial Instep 24.2 cm 25.2 cm Vascular Assessment Pulses: Dorsalis Pedis Palpable: [Left:Yes] [Right:Yes] Electronic Signature(s) Signed: 06/24/2022 7:46:07 AM By: [name] RN Entered By: [name] on 06/23/2022 09:56:43 -------------------------------------------------------------------------------- Multi Wound Chart Details Patient Name: Date of Service: [name], [name] [place] E. 06/23/2022 9:30 A M Medical Record Number: [id number] Patient Account Number: [id number] Date of Birth/Sex: Treating RN: October 29, 1975 (46 y.o. M) Primary Care Devi Hopman: Dorinda Hill Other Clinician: Referring Anaija Wissink: Treating Ashliegh Parekh/Extender: Jana Hakim in Treatment: 101 Vital  Signs Height(in): 69 Pulse(bpm): 76 Weight(lbs): 280 Blood Pressure(mmHg): 121/77 Body Mass Index(BMI): 41.3 Temperature(F): 98.3 Respiratory Rate(breaths/min): 16 [1:Photos:] [N/A:N/A [id number] Page 3 of 9] Right Calcaneus Left Calcaneus N/A Wound Location: Pressure Injury Pressure Injury N/A Wounding Event: Pressure Ulcer Pressure Ulcer N/A Primary Etiology: Asthma, Angina, Hypertension Asthma, Angina, Hypertension N/A Comorbid History: 10/07/2019 10/07/2019 N/A Date Acquired: 101 101 N/A Weeks of Treatment: Open Open N/A Wound Status: No No N/A Wound Recurrence: 0.1x0.1x0.1 2.7x1.2x0.2 N/A Measurements L x W x D (cm) 0.008 2.545 N/A A (cm) : rea 0.001 0.509 N/A  Volume (cm) : 100.00% 90.60% N/A % Reduction in A rea: 100.00% 98.10% N/A % Reduction in Volume: Category/Stage III Category/Stage III N/A Classification: Medium Medium N/A Exudate A mount: Serosanguineous Serosanguineous N/A Exudate Type: red, brown red, brown N/A Exudate Color: Thickened Thickened N/A Wound Margin: Small (1-33%) Medium (34-66%) N/A Granulation A mount: Pink Pink N/A Granulation Quality: Large (67-100%) Medium (34-66%) N/A Necrotic A mount: Eschar, Adherent Slough Eschar, Adherent Slough N/A Necrotic Tissue: Fat Layer (Subcutaneous Tissue): Yes Fat Layer (Subcutaneous Tissue): Yes N/A Exposed Structures: Fascia: No Fascia: No Tendon: No Tendon: No Muscle: No Muscle: No Joint: No Joint: No Bone: No Bone: No Large (67-100%) Small (1-33%) N/A Epithelialization: Debridement - Excisional Debridement - Excisional N/A Debridement: Pre-procedure Verification/Time Out 10:10 10:10 N/A Taken: Lidocaine 4% Topical Solution Lidocaine 4% Topical Solution N/A Pain Control: Callus, Subcutaneous, Slough Callus, Subcutaneous, Slough N/A Tissue Debrided: Skin/Subcutaneous Tissue Skin/Subcutaneous Tissue N/A Level: 0.01 3.24 N/A Debridement A (sq  cm): rea Curette Curette N/A Instrument: Minimum Minimum N/A Bleeding: Pressure Pressure N/A Hemostasis A chieved: 0 0 N/A Procedural Pain: 0 0 N/A Post Procedural Pain: Procedure was tolerated well Procedure was tolerated well N/A Debridement Treatment Response: 0.1x0.1x0.1 2.7x1.2x0.2 N/A Post Debridement Measurements L x W x D (cm) 0.001 0.509 N/A Post Debridement Volume: (cm) Category/Stage III Category/Stage III N/A Post Debridement Stage: Callus: Yes Callus: Yes N/A Periwound Skin Texture: Scarring: Yes No Abnormalities Noted Maceration: Yes N/A Periwound Skin Moisture: No Abnormalities Noted No Abnormalities Noted N/A Periwound Skin Color: No Abnormality No Abnormality N/A Temperature: Debridement Debridement N/A Procedures Performed: Treatment Notes Electronic Signature(s) Signed: 06/23/2022 10:23:27 AM By: [name] MD FACS Entered By: [name] on 06/23/2022 10:23:27 -------------------------------------------------------------------------------- Multi-Disciplinary Care Plan Details Patient Name: Date of Service: [name], [name] [place] E. 06/23/2022 9:30 A M Medical Record Number: [id number] Patient Account Number: [id number] Date of Birth/Sex: Treating RN: 1976/08/05 (46 y.o. [name] Primary Care Trixie Maclaren: Dorinda Hill Other Clinician: Referring Yiannis Tulloch: Treating Clell Trahan/Extender: Jana Hakim in Treatment: [phone] Multidisciplinary Care Plan reviewed with physician [address] [place], [place] ([id number]) [id number] Page 4 of 9 Wound/Skin Impairment Nursing Diagnoses: Impaired tissue integrity Knowledge deficit related [name] ulceration/compromised skin integrity Goals: Patient/caregiver will verbalize understanding of skin care regimen Date Initiated: 07/15/2020 Target Resolution Date: 03/08/2023 Goal Status: Active Ulcer/skin breakdown will have a volume reduction of 30% by week  4 Date Initiated: 07/15/2020 Date Inactivated: 08/20/2020 Target Resolution Date: 09/03/2020 Goal Status: Unmet Unmet Reason: no major changes. Ulcer/skin breakdown will heal within 14 weeks Date Initiated: 12/04/2020 Date Inactivated: 12/10/2020 Target Resolution Date: 12/10/2020 Unmet Reason: wounds still open at 14 Goal Status: Unmet weeks and today 21 weeks. Interventions: Assess patient/caregiver ability [name] obtain necessary supplies Assess patient/caregiver ability [name] perform ulcer/skin care regimen upon admission and as needed Assess ulceration(s) every visit Provide education on ulcer and skin care Treatment Activities: Skin care regimen initiated : 07/15/2020 Topical wound management initiated : 07/15/2020 Notes: Electronic Signature(s) Signed: 06/24/2022 7:46:07 AM By: [name] RN Entered By: [name] on 06/24/2022 07:40:03 -------------------------------------------------------------------------------- Pain Assessment Details Patient Name: Date of Service: [name], [name] [name] 06/23/2022 9:30 A M Medical Record Number: [id number] Patient Account Number: [id number] Date of Birth/Sex: Treating RN: [birth year] (46 y.o. [name] Primary Care Kareemah Grounds: Dorinda Hill Other Clinician: Referring Durelle Zepeda: Treating Shalona Harbour/Extender: Jana Hakim in Treatment: 101 Active Problems Location of Pain Severity and Description of Pain Patient Has Paino Yes Site Locations Pain Location: Generalized Pain  With Dressing Change: Yes Duration of the Pain. Constant / Intermittento Constant Rate the pain. Current Pain Level: 5 Worst Pain Level: 10 Least Pain Level: 3 [name], [name] ([id number]) [id number].pdf Page 5 of 9 Character of Pain Describe the Pain: Burning, Difficult [name] Pinpoint Pain Management and Medication Current Pain Management: Medication: Yes Cold Application: No Rest: Yes Massage: No Activity:  No T.MckenzieN.S.: No Heat Application: No Leg drop or elevation: No Is the Current Pain Management Adequate: Adequate How does your wound impact your activities of daily livingo Sleep: No Bathing: No Appetite: No Relationship With Others: No Bladder Continence: No Emotions: No Bowel Continence: No Work: No Toileting: No Drive: No Dressing: No Hobbies: No Electronic Signature(s) Signed: 06/24/2022 7:46:07 AM By: [name] RN Entered By: [name] on 06/23/2022 09:54:48 -------------------------------------------------------------------------------- Patient/Caregiver Education Details Patient Name: Date of Service: [name], [name] [place] 11/16/2023andnbsp9:30 A M Medical Record Number: [id number] Patient Account Number: [id number] Date of Birth/Gender: Treating RN: [birth year] (46 y.o. [name] Primary Care Physician: Dorinda Hill Other Clinician: Referring Physician: Treating Physician/Extender: Jana Hakim in Treatment: 101 Education Assessment Education Provided [name]: Patient Education Topics Provided Wound/Skin Impairment: Methods: Explain/Verbal Responses: Return demonstration correctly Electronic Signature(s) Signed: 06/24/2022 7:46:07 AM By: [name] RN Entered By: [name] on 06/24/2022 07:40:19 -------------------------------------------------------------------------------- Wound Assessment Details Patient Name: Date of Service: [name], [name] [name] 06/23/2022 9:30 A M Medical Record Number: [id number] Patient Account Number: [id number] Date of Birth/Sex: Treating RN: [birth year] (46 y.o. [name] Primary Care Deondre Marinaro: Dorinda Hill Other Clinician: Referring Geneveive Furness: Treating Jarnell Cordaro/Extender: Jana Hakim in Treatment: [address] ([id number]) [id number] Page 6 of 9 Wound Status Wound Number: 1 Primary Etiology: Pressure Ulcer Wound Location:  Right Calcaneus Wound Status: Open Wounding Event: Pressure Injury Comorbid History: Asthma, Angina, Hypertension Date Acquired: 10/07/2019 Weeks Of Treatment: 101 Clustered Wound: No Photos Wound Measurements Length: (cm) 0.1 Width: (cm) 0.1 Depth: (cm) 0.1 Area: (cm) 0.008 Volume: (cm) 0.001 % Reduction in Area: 100% % Reduction in Volume: 100% Epithelialization: Large (67-100%) Tunneling: No Undermining: No Wound Description Classification: Category/Stage III Wound Margin: Thickened Exudate Amount: Medium Exudate Type: Serosanguineous Exudate Color: red, brown Foul Odor After Cleansing: No Slough/Fibrino Yes Wound Bed Granulation Amount: Small (1-33%) Exposed Structure Granulation Quality: Pink Fascia Exposed: No Necrotic Amount: Large (67-100%) Fat Layer (Subcutaneous Tissue) Exposed: Yes Necrotic Quality: Eschar, Adherent Slough Tendon Exposed: No Muscle Exposed: No Joint Exposed: No Bone Exposed: No Periwound Skin Texture Texture Color No Abnormalities Noted: No No Abnormalities Noted: Yes Callus: Yes Temperature / Pain Temperature: No Abnormality Moisture No Abnormalities Noted: Yes Treatment Notes Wound #1 (Calcaneus) Wound Laterality: Right Cleanser Normal Saline Discharge Instruction: Cleanse the wound with Normal Saline prior [name] applying a clean dressing using gauze sponges, not tissue or cotton balls. Wound Cleanser Discharge Instruction: Cleanse the wound with wound cleanser prior [name] applying a clean dressing using gauze sponges, not tissue or cotton balls. Peri-Wound Care Topical Gentamicin Discharge Instruction: apply this week on R and L Primary Dressing Endoform 2x2 in Discharge Instruction: Moisten with saline [name], [name] ([id number]) [phone].pdf Page 7 of 9 Secondary Dressing ALLEVYN Heel 4 1/2in x 5 1/2in / 10.5cm x 13.5cm Discharge Instruction: Apply over primary dressing as directed. Woven Gauze Sponge,  Non-Sterile 4x4 in Discharge Instruction: Apply over primary dressing as directed. Secured With Conforming Stretch Gauze Bandage, Sterile 2x75 (in/in) Discharge Instruction: Secure with stretch gauze as directed. 90M Medipore H Soft  Cloth Surgical T ape, 4 x 10 (in/yd) Discharge Instruction: Secure with tape as directed. Compression Wrap Compression Stockings Add-Ons Electronic Signature(s) Signed: 06/24/2022 7:46:07 AM By: [name] RN Entered By: [name] on 06/23/2022 10:04:52 -------------------------------------------------------------------------------- Wound Assessment Details Patient Name: Date of Service: [name], [name] [name] 06/23/2022 9:30 A M Medical Record Number: [id number] Patient Account Number: [id number] Date of Birth/Sex: Treating RN: 02-25-1976 (46 y.o. [name] Primary Care Reeve Mallo: Dorinda Hill Other Clinician: Referring Milo Schreier: Treating Nikira Kushnir/Extender: Jana Hakim in Treatment: 101 Wound Status Wound Number: 2 Primary Etiology: Pressure Ulcer Wound Location: Left Calcaneus Wound Status: Open Wounding Event: Pressure Injury Comorbid History: Asthma, Angina, Hypertension Date Acquired: 10/07/2019 Weeks Of Treatment: 101 Clustered Wound: No Photos Wound Measurements Length: (cm) 2.7 Width: (cm) 1.2 Depth: (cm) 0.2 Area: (cm) 2.545 Volume: (cm) 0.509 % Reduction in Area: 90.6% % Reduction in Volume: 98.1% Epithelialization: Small (1-33%) Tunneling: No Undermining: No Wound Description Classification: Category/Stage III Wound Margin: Thickened Exudate Amount: Medium Exudate Type: Serosanguineous Exudate Color: red, brown [name], [name] ([id number]) Foul Odor After Cleansing: No Slough/Fibrino Yes [id number] Page 8 of 9 Wound Bed Granulation Amount: Medium (34-66%) Exposed Structure Granulation Quality: Pink Fascia Exposed: No Necrotic Amount: Medium (34-66%) Fat  Layer (Subcutaneous Tissue) Exposed: Yes Necrotic Quality: Eschar, Adherent Slough Tendon Exposed: No Muscle Exposed: No Joint Exposed: No Bone Exposed: No Periwound Skin Texture Texture Color No Abnormalities Noted: No No Abnormalities Noted: Yes Callus: Yes Temperature / Pain Scarring: Yes Temperature: No Abnormality Moisture No Abnormalities Noted: No Maceration: Yes Treatment Notes Wound #2 (Calcaneus) Wound Laterality: Left Cleanser Normal Saline Discharge Instruction: Cleanse the wound with Normal Saline prior [name] applying a clean dressing using gauze sponges, not tissue or cotton balls. Wound Cleanser Discharge Instruction: Cleanse the wound with wound cleanser prior [name] applying a clean dressing using gauze sponges, not tissue or cotton balls. Peri-Wound Care Topical TCC Discharge Instruction: [name] left leg (on hold 06/23/22) keystone Discharge Instruction: use Primary Dressing Endoform 2x2 in Discharge Instruction: Moisten with saline. Put on after appt. with [facility]11/9/23 Secondary Dressing Optifoam Non-Adhesive Dressing, 4x4 in Discharge Instruction: Apply over primary dressing as directed. Woven Gauze Sponge, Non-Sterile 4x4 in Discharge Instruction: Apply over primary dressing as directed. Secured With 58M Medipore H Soft Cloth Surgical T ape, 4 x 10 (in/yd) Discharge Instruction: Secure with tape as directed. Compression Wrap Compression Stockings Add-Ons Electronic Signature(s) Signed: 06/24/2022 7:46:07 AM By: [name] RN Entered By: [name] on 06/23/2022 10:05:30 -------------------------------------------------------------------------------- Vitals Details Patient Name: Date of Service: [name], [name] [place] E. 06/23/2022 9:30 A M Medical Record Number: [id number] Patient Account Number: [id number] Date of Birth/Sex: Treating RN: Mar 02, 1976 (46 y.o. [name], [name], [name] ([id number])  [id number] Page 9 of 9 Primary Care Nichola Warren: Dorinda Hill Other Clinician: Referring Kalei Meda: Treating Marae Cottrell/Extender: Jana Hakim in Treatment: 101 Vital Signs Time Taken: 09:52 Temperature (F): 98.3 Height (in): 69 Pulse (bpm): 76 Weight (lbs): 280 Respiratory Rate (breaths/min): 16 Body Mass Index (BMI): 41.3 Blood Pressure (mmHg): 121/77 Reference Range: 80 - 120 mg / dl Electronic Signature(s) Signed: 06/24/2022 7:46:07 AM By: [name] RN Entered By: [name] on 06/23/2022 09:54:03

## 2022-06-24 NOTE — Progress Notes (Signed)
KIPP, SHANK ([id number]) [id number] Page 1 of 16 Visit Report for 06/23/2022 Chief Complaint Document Details Patient Name: Date of Service: [name], [name] 06/23/2022 9:30 A M Medical Record Number: [id number] Patient Account Number: [id number] Date of Birth/Sex: Treating RN: [birth year] (46 y.o. M) Primary Care Provider: Cristie [name] Other Clinician: Referring Provider: Treating Provider/Extender: [name] in Treatment: 101 Information Obtained from: Patient Chief Complaint Bilateral Plantar Foot Ulcers Electronic Signature([name]) Signed: 06/23/2022 10:23:35 AM By: [name] Maudlin MD FACS Entered By: [name] on 06/23/2022 10:23:34 -------------------------------------------------------------------------------- Debridement Details Patient Name: Date of Service: [name], [name] 06/23/2022 9:30 A M Medical Record Number: [id number] Patient Account Number: [id number] Date of Birth/Sex: Treating RN: 1976/01/18 (47 y.o. [name] Primary Care Provider: Cristie [name] Other Clinician: Referring Provider: Treating Provider/Extender: [name] in Treatment: 101 Debridement Performed for Assessment: Wound #1 Right Calcaneus Performed By: Physician [name] Maudlin, MD Debridement Type: Debridement Level of Consciousness (Pre-procedure): Awake and Alert Pre-procedure Verification/Time Out Yes - 10:10 Taken: Start Time: 10:10 Pain Control: Lidocaine 4% T opical Solution T Area Debrided (L x W): otal 0.1 (cm) x 0.1 (cm) = 0.01 (cm) Tissue and other material debrided: Non-Viable, Callus, Slough, Subcutaneous, Slough Level: Skin/Subcutaneous Tissue Debridement Description: Excisional Instrument: Curette Bleeding: Minimum Hemostasis Achieved: Pressure End Time: 10:11 Procedural Pain: 0 Post Procedural Pain: 0 Response to Treatment: Procedure was tolerated well Level of Consciousness  (Post- Awake and Alert procedure): Post Debridement Measurements of Total Wound Length: (cm) 0.1 Stage: Category/Stage III Width: (cm) 0.1 Depth: (cm) 0.1 Volume: (cm) 0.001 Character of Wound/Ulcer Post Debridement: Improved Post Procedure Diagnosis [name], [name] ([id number]) [id number] Page 2 of 16 Same as Pre-procedure Electronic Signature([name]) Signed: 06/23/2022 10:58:45 AM By: [name] Maudlin MD FACS Signed: 06/24/2022 7:46:07 AM By: [name] Entered By: [name] on 06/23/2022 10:19:13 -------------------------------------------------------------------------------- Debridement Details Patient Name: Date of Service: [name], [name] 06/23/2022 9:30 A M Medical Record Number: [id number] Patient Account Number: [id number] Date of Birth/Sex: Treating RN: 1976/04/15 (46 y.o. [name] Primary Care Provider: Cristie [name] Other Clinician: Referring Provider: Treating Provider/Extender: [name] in Treatment: 101 Debridement Performed for Assessment: Wound #2 Left Calcaneus Performed By: Physician [name] Maudlin, MD Debridement Type: Debridement Level of Consciousness (Pre-procedure): Awake and Alert Pre-procedure Verification/Time Out Yes - 10:10 Taken: Start Time: 10:10 Pain Control: Lidocaine 4% T opical Solution T Area Debrided (L x W): otal 2.7 (cm) x 1.2 (cm) = 3.24 (cm) Tissue and other material debrided: Non-Viable, Callus, Slough, Subcutaneous, Slough Level: Skin/Subcutaneous Tissue Debridement Description: Excisional Instrument: Curette Bleeding: Minimum Hemostasis Achieved: Pressure End Time: 10:11 Procedural Pain: 0 Post Procedural Pain: 0 Response to Treatment: Procedure was tolerated well Level of Consciousness (Post- Awake and Alert procedure): Post Debridement Measurements of Total Wound Length: (cm) 2.7 Stage: Category/Stage III Width: (cm) 1.2 Depth: (cm)  0.2 Volume: (cm) 0.509 Character of Wound/Ulcer Post Debridement: Improved Post Procedure Diagnosis Same as Pre-procedure Notes Scribed for Dr. [name] by J.Scotton Electronic Signature([name]) Signed: 06/23/2022 10:58:45 AM By: [name] Maudlin MD FACS Signed: 06/24/2022 7:46:07 AM By: [name] Entered By: [name] on 06/23/2022 10:21:08 -------------------------------------------------------------------------------- HPI Details Patient Name: Date of Service: [name], [name] 06/23/2022 9:30 A Marvel Plan ([id number]) [id number] Page 3 of 16 Medical Record Number: [id number] Patient Account Number: [id number] Date of Birth/Sex: Treating RN: 1976-07-15 (46 y.o. M) Primary Care Provider: Cristie [name] Other Clinician: Referring  Provider: Treating Provider/Extender: [name] in Treatment: 101 History of Present Illness HPI Description: Wounds are12/03/2020 upon evaluation today patient presents for initial inspection here in our clinic concerning issues he has been having with the bottoms of his feet bilaterally. He states these actually occurred as wounds when he was hospitalized for 5 months secondary to Covid. He was apparently with tilting bed where he was in an upright position quite frequently and apparently this occurred in some way shape or form during that time. Fortunately there is no sign of active infection at this time. No fevers, chills, nausea, vomiting, or diarrhea. With that being said he still has substantial wounds on the plantar aspects of his feet Theragen require quite a bit of work to get these to heal. He has been using Santyl currently though that is been problematic both in receiving the medication as well as actually paid for it as it is become quite expensive. Prior to the experience with Covid the patient really did not have any major medical problems other than hypertension he does have  some mild generalized weakness following the Covid experience. 07/22/2020 on evaluation today patient appears to be doing okay in regard to his foot ulcers I feel like the wound beds are showing signs of better improvement that I do believe the Iodoflex is helping in this regard. With that being said he does have a lot of drainage currently and this is somewhat blue/green in nature which is consistent with Pseudomonas. I do think a culture today would be appropriate for [place] to evaluate and see if that is indeed the case I would likely start him on antibiotic orally as well he is not allergic to Cipro knows of no issues he has had in the past 12/21; patient was admitted to the clinic earlier this month with bilateral presumed pressure ulcers on the bottom of his feet apparently related to excessive pressure from a tilt table arrangement in the intensive care unit. Patient relates this to being on ECMO but I am not really sure that is exactly related to that. I must say I have never seen anything like this. He has fairly extensive full-thickness wounds extending from his heel towards his midfoot mostly centered laterally. There is already been some healing distally. He does not appear to have an arterial issue. He has been using gentamicin to the wound surfaces with Iodoflex to help with ongoing debridement 1/6; this is a patient with pressure ulcers on the bottom of his feet related to excessive pressure from a standing position in the intensive care unit. He is complaining of a lot of pain in the right heel. He is not a diabetic. 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 We have been using Iodoflex to help prepare the surfaces of both wounds for an advanced treatment product. He is nonambulatory spending most of his time in a wheelchair I have asked him not to propel the wheelchair with his heels 1/13; in general his wounds look better not much surface area change we have been  using Iodoflex as of last week. I did an x-ray of the right heel as the patient was complaining of pain. I had some thoughts about a stress fracture perhaps Achilles tendon problems however what it showed was erosive changes along the inferior aspect of the calcaneus he now has a MRI booked for 1/20. 1/20; in general his wounds continue to be better. Some improvement in the large narrow areas  proximally in his foot. He is still complaining of pain in the right heel and tenderness in certain areas of this wound. His MRI is tonight. I am not just looking for osteomyelitis that was brought up on the x-ray I am wondering about stress fractures, tendon ruptures etc. He has no such findings on the left. Also noteworthy is that the patient had critical illness neuropathy and some of the discomfort may be actual improvement in nerve function I am just not sure. These wounds were initially in the setting of severe critical illness related to COVID-19. He was put in a standing position. He may have also been on pressors at the point contributing to tissue ischemia. By his description at some point these wounds were grossly necrotic extending proximally up into the Achilles part of his heel. I do not know that I have ever really seen pictures of them like this although they may exist in epic We have ordered Tri layer Oasis. I am trying to stimulate some granulation in these areas. This is of course assuming the MRI is negative for infection 1/27; since the patient was last here he saw Dr. Juleen China of infectious disease. He is planned for vancomycin and ceftriaxone. Prior operative culture grew MSSA. Also ordered baseline lab work. He also ordered arterial studies although the ABIs in our clinic were normal as well as his clinical exam these were normal I do not think he needs to see vascular surgery. His ABIs at the [facility] were 1.22 in the right triphasic waveforms with a normal TBI of 1.15 on the left ABI of 1.22  with triphasic waveforms and a normal TBI of 1.08. Finally he saw Dr. Amalia Hailey who will follow him in for 2 months. At this point I do not think he felt that he needed a procedure on the right calcaneal bone. Dr. Juleen China is elected for broad-spectrum antibiotic The patient is still having pain in the right heel. He walks with a walker 2/3; wounds are generally smaller. He is tolerating his IV antibiotics. I believe this is vancomycin and ceftriaxone. We are still waiting for Oasis burn in terms of his out-of-pocket max which he should be meeting soon given the IV antibiotics, MRIs etc. I have asked him to check in on this. We are using silver collagen in the meantime the wounds look better 2/10; tolerating IV vancomycin and Rocephin. We are waiting to apply for Oasis. Although I am not really sure where he is in his out-of-pocket max. 2/17 started the first application of Oasis trilayer. Still on antibiotics. The wounds have generally look better. The area on the left has a little more surface slough requiring debridement 3/61; second application of Oasis trilayer. The wound surface granulation is generally look better. The area on the left with undermining laterally I think is come in a bit. 10/08/2020 upon evaluation today patient is here today for [employer] application #3. Fortunately he seems to be doing extremely well with regard to this and we are seeing a lot of new epithelial growth which is great news. Fortunately there is no signs of active infection at this time. 10/16/2020 upon evaluation today patient appears to be doing well with regard to his foot ulcers. Do believe the Oasis has been of benefit for him. I do not see any signs of infection right now which is great news and I think that he has a lot of new epithelial growth which is great to see as well. The patient is  very pleased to hear all of this. I do think we can proceed with the Oasis trilayer #4 today. 3/18; not as much  improvement in these areas on his heels that I was hoping. I did reapply trilateral Oasis today the tissue looks healthier but not as much fill in as I was hoping. 3/25; better looking today I think this is come in a bit the tissue looks healthier. Triple layer Oasis reapplied #6 4/1; somewhat better looking definitely better looking surface not as much change in surface area as I was hoping. He may be spending more time [place] on days then he needs to although he does have heel offloading boots. Triple layer Oasis reapplied #7 4/7; unfortunately apparently [facility] will not approve any further Oasis which is unfortunate since the patient did respond nicely both in terms of the condition of the wound bed as well as surface area. There is still some drainage coming from the wound but not a lot there does not appear to be any infection 4/15; we have been using Hydrofera Blue. He continues to have nice rims of epithelialization on the right greater than the left. The left the epithelialization is coming from the tip of his heel. There is moderate drainage. In this that concerns me about a total contact cast. There is no evidence of infection 4/29; patient has been using Hydrofera Blue with dressing changes. He has no complaints or issues today. 5/5; using Hydrofera Blue. I actually think that he looks marginally better than the last time I saw this 3 weeks ago. There are rims of epithelialization on the left thumb coming from the medial side on the right. Using Hydrofera Blue 5/12; using Hydrofera Blue. These continue to make improvements in surface area. His drainage was not listed as severe I therefore went ahead and put a cast on the left foot. Right foot we will continue to dress his previous 5/16; back for first total contact cast change. He did not tolerate this particularly well cast injury on the anterior tibia among other issues. Difficulty sleeping. I talked him about this in some  detail and afterwards is elected to continue. I told him I would like to have a cast on for 3 weeks to see if this is going to help at JERYL, UMHOLTZ ([id number]) [phone].pdf Page 4 of 16 all. I think he agreed 5/19; I think the wound is better. There is no tunneling towards his midfoot. The undermining medially also looks better. He has a rim of new skin distally. I think we are making progress here. The area on the left also continues to look somewhat better to me using Hydrofera Blue. He has a list of complaints about the cast but none of them seem serious 5/26; patient presents for 1 week follow-up. He has been using a total contact cast and tolerating this well. Hydrofera Blue is the main dressing used. He denies signs of infection. 6/2 Hydrofera Blue total contact cast on the left. These were large ulcers that formed in intensive care unit where the patient was recovering from [place]. May have had something to do with being ventilated in an upright positiono Pressors etc. We have been able to get the areas down considerably and a viable surface. There is some epithelialization in both sides. Note made of drainage 6/9; changed to [facility] last time because of drainage. He arrives with better looking surfaces and dimensions on the left than the right. Paradoxically the right actually  probes more towards his midfoot the left is largely close down but both of these look improved. Using a total contact cast on the left 6/16; complex wounds on his bilateral plantar heels which were initially pressure injury from a stay in the ICU with COVID. We have been using silver alginate most recently. His dimensions of come in quite dramatically however not recently. We have been putting the left foot in a total contact cast 6/23; complex wounds on the bilateral plantar heels. I been putting the left in the cast paradoxically the area on the right is the one that is going towards  closure at a faster rate. Quite a bit of drainage on the left. The patient went to see Dr. Amalia Hailey who said he was going to standby for skin grafts. I had actually considered sending him for skin grafts however he would be mandatorily off his feet for a period of weeks to months. I am thinking that the area on the right is going to close on its own the area on the left has been more stubborn even though we have him in a total contact cast 6/30; took him out of a total contact cast last week is the right heel seem to be making better progress than the left where I was placing the cast. We are using silver alginate. Both wounds are smaller right greater than left 7/12; both wounds look as though they are making some progress. We are using silver alginate. Heel offloading boots 7/26; very gradual progress especially on the right. Using silver alginate. He is wearing heel offloading boots 8/18; he continues to close these wounds down very gradually. Using silver alginate. The problem polymen being definitive about this is areas of what appears to be callus around the margins. This is not a 100% of the area but certainly sizable especially on the right 9/1; bilateral plantar feet wounds secondary to prolonged pressure while being ventilated for COVID-19 in an upright position. Essentially pressure ulcers on the bottom of his feet. He is made substantial progress using silver alginate. 9/14; bilateral plantar feet wounds secondary to prolonged pressure. Making progress using silver alginate. 9/29 bilateral plantar feet wounds secondary to prolonged pressure. I changed him to Iodoflex last week. MolecuLight showing reddened blush fluorescence 10/11; patient presents for follow-up. He has no issues or complaints today. He denies signs of infection. He continues to use Iodoflex and antibiotic ointment to the wound beds. 10/27; 2-week follow-up. No evidence of infection. He has callus and thick dry skin around  the wound margins we have been using Iodoflex and Bactroban which was in response to a moderate left MolecuLight reddish blush fluorescence. 11/10; 2-week follow-up. Wound margins again have thick callus however the measurements of the actual wound sites are a lot smaller. Everything looks reasonably healthy here. We have been using Iodoflex He was approved for prime matrix but I have elected to delay this given the improvement in the surface area. Hopefully I will not regret that decision as were getting close to the end of the year in terms of insurance payment 12/8; 2-week follow-up. Wounds are generally smaller in size. These were initially substantial wounds extending into the forefoot all the way into the heel on the bilateral plantar feet. They are now both located on the plantar heel distal aspect both of these have a lot of callus around the wounds I used a #5 curette to remove this on the right and the left also some subcutaneous debris to  try and get the wound edges were using Iodoflex. He has heel offloading shoe 12/22; 2-week follow-up. Not really much improvement. He has thick callus around the outer edges of both wounds. I remove this there is some nonviable subcutaneous tissue as well. We have been using Iodoflex. Her intake nurse and myself spontaneously thought of a total contact cast I went back in May. At that time we really were not seeing much of an improvement with a cast although the wound was in a much different situation I would like to retry this in 2 weeks and I discussed this with the patient 08/12/2021; the patient has had some improvement with the Iodoflex. The the area on the left heel plantar more improved than the right. I had to put him in a total contact cast on the left although I decided to put that off for 2 weeks. I am going to change his primary dressing to silver collagen. I think in both areas he has had some improvement most of the healing seems to be more  proximal in the heel. The wounds are in the mid aspect. A lot of thick callus on the right heel however. 1/19; we are using silver collagen on both plantar heel areas. He has had some improvement today. The left did not require any debridement. He still had some eschar on the right that was debrided but both seem to have contracted. I did not put it total contact cast on him today 2/2 we have been using silver collagen. The area on the right plantar heel has areas that appear to be epithelialized interspersed with dry flaking callus and dry skin. I removed this. This really looks better than on the other side. On the left still a large area with raised edges and debris on the surface. 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 He has no issues or complaints today. 2/9; not much change in the left foot wound with 1 week of a cast we are using silver collagen. Silver collagen on the right side. The right side has been the better wound surface. We will reapply the total contact cast on the left 2/16; not much improvement on either side I been using silver collagen with a total contact cast on the left. I'm changing the Hydrofera Blue still with a total contact cast on the left 2/23; some improvement on both sides. Disappointing that he has thick callus around the area that we are putting in a total contact cast on the left. We've been using Hydrofera Blue on both wound areas. This is a man who at essentially pressure ulcers in addition to ischemia caused by medications to support his blood pressure (pressors) in the ICU. He was being ventilated in the standing position for severe Covid. A Shiley the wounds extended across his entire foot but are now localized to his plantar heels bilaterally. We have made progress however neither areas healed. I continue to think the total  contact cast is helped albeit painstakingly slowly. He has never wanted a plastic surgery consult although I don't know that they would be interested in grafting in area in this location. 10/07/2021: Continued improvement bilaterally. There is still some callus around the left wound, despite the total contact cast. He has some increased pain in his right midfoot around 1 particular area. This has been painful in the  past but seems to be a little bit worse. When his cast was removed today, there was an area on the heel of the left foot that looks a bit macerated. He is also complaining of pain in his left thigh and hip which he thinks is secondary to the limb length discrepancy caused by the cast. 10/14/2021: He continues to improve. A little bit less callus around the left wound. He continues to endorse pain in his right midfoot, but this is not as significant as it was last week. The maceration on his left heel is improved. 10/21/2021: Continued improvement to both wounds. The maceration on his left heel is no longer evident. Less callus bilaterally. Epithelialization progressing. 10/28/2021: Significant improvement this week. The right sided wound is nearly closed with just a small open area at the middle. No maceration seen on the left heel. Continued epithelialization on both sides. No concern for infection. 11/04/2021: T oday, the wounds were measured a little bit differently and come out as larger, but I actually think they are about the same to potentially even smaller, particularly on the left. He continues to accumulate some callus on the right. 11/11/2021: T oday, the patient is expressing some concern that the left wound, despite being in the total contact cast, is not progressing at the same rate as the 1 on the right. He is interested in trying a week without the cast to see how the wound does. The wounds are roughly the same size as last week, with the right perhaps being a little bit smaller. He  continues to build up callus on both sites. TYLAN, KINN ([id number]) [id number] Page 5 of 16 11/18/2021: Last week, I permitted the patient to go without his total contact cast, just to see if the cast was really making any difference. Today, both wounds have deteriorated to some extent, suggesting that the cast is providing benefit, at least on the left. Both are larger and have accumulated callus, slough, and other debris. 11/26/2021: I debrided both wounds quite aggressively last week in an effort to stimulate the healing cascade. This appears to have been effective as the left sided wound is a full centimeter shorter in length. Although the right was measured slightly larger, on inspection, it looks as though an area of epithelialized tissue was included in the measurements. We have been using PolyMem Ag on the wound surfaces with a total contact cast to the left. 12/02/2021: It appears that the intake personnel are including epithelialized tissue in his wound measurements; the right wound is almost completely epithelialized; there is just a crater at the proximal midfoot with some open areas. On the left, he has built up some callus, but the overall wound surface looks good. There is some senescent skin around the wound margin. He has been in PolyMem Ag bilaterally with a total contact cast on the left. 12/09/2021: The right wound is nearly closed; there is just a small open area at the mid calcaneus. On the left, the wound is smaller with minimal callus buildup. No significant drainage. 12/16/2021: The right calcaneal wound remains minimally open at the mid calcaneus; the rest has epithelialized. On the left, the wound is also a little bit smaller. There is some senescent tissue on the periphery. He is getting his first application of a trial skin substitute called Vendaje today. 12/23/2021: The wound on his right calcaneus is nearly closed; there is just a small area at  the most distal aspect of the calcaneus that  is open. On the left, the area where we applied to the skin substitute has a healthier-looking bed of granulation tissue. The wound dimensions are not significantly different on this side but the wound surface is improved. 12/30/2021: The wound on the right calcaneus has not changed significantly aside from some accumulation of callus. On the left, the open area is smaller and continues to have an improved surface. He continues to accumulate callus around the wound. He is here for his third application of Vendaje. 01/06/2022: The right calcaneal wound is down to just a couple of millimeters. He continues to accumulate periwound callus. He unfortunately got his cast wet earlier in the week and his left foot is macerated, resulting in some superficial skin loss just distal to the open wound. The open wound itself, however, is much smaller and has a healthier appearing surface. He is here for his fourth application of Vendaje. 01/13/2022: The right calcaneal wound is about the same. Unfortunately, once again, his cast got wet and his foot is again macerated. This is caused the left calcaneal wound to enlarge. He is here for his fifth application of Vendaje. 01/20/2022: The right calcaneal wound is very small. There is some periwound callus accumulation. He purchased a new cast protector last week and this has been effective in avoiding the maceration that has been occurring on the left. The left calcaneal wound is narrower and has a healthy and viable-appearing surface. He is here for his 6 application of Vendaje. 01/27/2022: The right calcaneal wound is down to just a pinhole. There is some periwound slough and callus. On the left, the wound is narrower and shorter by about a centimeter. The surface is robust and viable-appearing. Unfortunately, the rep for the trial skin substitute product did not provide any for [place] to use today. 02/04/2022: The right calcaneal  wound remains unchanged. There is more accumulated callus. On the left, although the intake nurse measured it a little bit longer, it looks about the same to me. The surface has a layer of slough, but underneath this, there is good granulation tissue. 02/10/2022: The right calcaneus wound is nearly closed. There is still some callus that builds up around the site. The left side looks about the same in terms of dimensions, but the surface is more robust and vital-appearing. 02/16/2022: The area of the right calcaneus that was nearly closed last week has closed, but there is a small opening at the mid foot where it looks like some moisture got retained and caused some reopening. The left foot wound is narrower and shallower. Both sites have a fair amount of periwound callus and eschar. 02/24/2022: The small midfoot opening on the right calcaneus is a little bit smaller today. The left foot wound is narrower and shallower. He continues to accumulate periwound callus. No concern for infection. 03/01/2022: The patient came to clinic early because he showered and got his cast wet. Fortunately, there is no significant maceration to his foot but the callus softened and it looks like the wound on his left calcaneus may be a little bit wider. The wound on his right calcaneus is just a narrow slit. Continued accumulation of periwound callus bilaterally. 03/08/2022: The wound on his right calcaneus is very nearly closed, just a small pinpoint opening under a bit of eschar; the left wound has come in quite a bit, as well. It is narrower and shorter than at our last visit. Still with accumulated callus and eschar bilaterally. 03/17/2022: The right calcaneal  wound is healed. The left wound is smaller and the surface itself is very clean, but there is some blue-green staining on the periwound callus, concerning for Pseudomonas aeruginosa. 03/23/2022: The right calcaneal wound remains closed. The left wound continues to  contract. No further blue-green staining. Small amount of callus and slough accumulation. 03/28/2022: He came in early today because he had gotten his cast wet. On inspection, the wound itself did not get wet or macerated, just a little bit of the forefoot. The wound itself is basically unchanged. 04/07/2022: The right foot wound remains closed. The left wound is the smallest that I have seen it to date. It is narrower and shorter. It still continues to accumulate slough on the surface. 04/15/2022: There is a band of epithelium now dividing the small left plantar foot wound in 2. There is still some slough on the surface. 04/21/2022: The wound continues to narrow. Just a little bit of slough on the surface. He seems to be responding well to endoform. 04/28/2022: Continued slow contraction of the wound. There is a little slough on the surface and some periwound callus. We have been using endoform and total contact cast. 05/05/2022: The wound appears to have stalled. There is slough and some periwound eschar/callus. No concern for infection, however. 05/12/2022: Unfortunately, his right foot has reopened. It is located at the most posterior aspect of his surgical incision. The area was noted to have drainage coming from it when his padding was removed today. Underneath some callus and senescent skin, there is an opening. No purulent drainage or malodor. On the left foot, the wound is again unchanged. There is some light blue staining on the callus, but no malodor or purulent drainage. 10/13; right and left heel remanence of extensive plantar foot wounds. These are better than I remember by quite a big margin however he is still left with wounds on the left plantar heel and the right plantar heel. Been using endoform bilaterally. A culture was done that showed apparently Pseudomonas but we are still waiting for the [facility] antibiotic to use gentamicin today. He is still very active by description I am not sure  about the offloading of his noncasted right foot 10/20; both wounds right and left heel debrided not much change from last week. [employer] has arrived which is linezolid, gentamicin and ciprofloxacin we will use this with endoform. T contact cast on the left MIQUEL, STACKS ([id number]) [id number] Page 6 of 16 06/02/2022: Both wounds are smaller today. There is still a fair amount of callus buildup around the right foot ulcer. The left is more superficial and nearly flush with the surrounding tissues. Also with slough and eschar buildup. 06/10/2022: The right sided wound appears to be nearly closed, if not completely so, although it is somewhat difficult to tell given the abnormal tissue and scarring in his foot. There is a fair amount of callus and crust accumulation. On the left, the wound looks about the same, again with callus and slough. He has an appointment next Thursday with Dr. Loel Lofty in podiatry; I am hopeful that there may be some reconstructive options available for Mr. Pellow. 06/16/2022: Both wounds have some eschar and callus accumulation. The right sided wound is extremely narrow and barely open; the left is narrower than last week. There is a little bit of slough. He has his appointment in podiatry later today. 06/23/2022: The patient met with Dr. Loel Lofty last week and unfortunately, there are no reconstructive options that he believes  would be helpful. He did order an MRI to evaluate for osteomyelitis and fortunately, none was seen. The left sided wound is a little bit shorter and narrower today. The right sided wound is about the same. There is callus and eschar accumulation bilaterally. Electronic Signature([name]) Signed: 06/23/2022 10:24:37 AM By: [name] Maudlin MD FACS Entered By: [name] on 06/23/2022 10:24:37 -------------------------------------------------------------------------------- Physical Exam Details Patient Name:  Date of Service: [name], [name] 06/23/2022 9:30 A M Medical Record Number: [id number] Patient Account Number: [id number] Date of Birth/Sex: Treating RN: September 27, 1975 (46 y.o. M) Primary Care Provider: Cristie [name] Other Clinician: Referring Provider: Treating Provider/Extender: [name] in Treatment: 101 Constitutional . . . . No acute distress.Marland Kitchen Respiratory Normal work of breathing on room air.. Notes 06/23/2022: The left sided wound is a little bit shorter and narrower today. The right sided wound is about the same. There is callus and eschar accumulation bilaterally. Electronic Signature([name]) Signed: 06/23/2022 10:34:33 AM By: [name] Maudlin MD FACS Previous Signature: 06/23/2022 10:25:13 AM Version By: [name] Maudlin MD FACS Entered By: [name] on 06/23/2022 10:34:32 -------------------------------------------------------------------------------- Physician Orders Details Patient Name: Date of Service: [name], [name] 06/23/2022 9:30 A M Medical Record Number: [id number] Patient Account Number: [id number] Date of Birth/Sex: Treating RN: 07/04/1976 (46 y.o. [name] Primary Care Provider: Cristie [name] Other Clinician: Referring Provider: Treating Provider/Extender: [name] in Treatment: [phone] Verbal / Phone Orders: No Diagnosis Coding ICD-10 Coding Code Description [phone] Non-pressure chronic ulcer of other part of right foot with fat layer exposed L97.522 Non-pressure chronic ulcer of other part of left foot with fat layer exposed SHADRICK, SENNE ([id number]) [id number] Page 7 of 16 Follow-up Appointments ppointment in 1 week. - Dr. [name] Room 3 Return A 11/22 at 10:15 AM Anesthetic Wound #2 Left Calcaneus (In clinic) Topical Lidocaine 4% applied to wound bed - In clinic Bathing/ Shower/ Hygiene May shower with protection but do not get wound dressing([name]) wet. - Cover  with cast protector (can purchase cast protector at [facility] or [facility] (Cast on hold 06/23/22) Edema Control - Lymphedema / SCD / Other Bilateral Lower Extremities Elevate legs to the level of the heart or above for 30 minutes daily and/or when sitting, a frequency of: - throughout the day Avoid standing for long periods of time. Moisturize legs daily. - as needed Off-Loading Total Contact Cast to Left Lower Extremity - TCC to Left LL (T Cast on hold this week-06/23/22) otal Other: - keep pressure off of the bottom of your feet. Globoped to left foot-when not wearing the cast Elevate legs throughout the day Additional Orders / Instructions Follow Nutritious Diet Wound Treatment Wound #1 - Calcaneus Wound Laterality: Right Cleanser: Normal Saline (Generic) Every Other Day/30 Days Discharge Instructions: Cleanse the wound with Normal Saline prior to applying a clean dressing using gauze sponges, not tissue or cotton balls. Cleanser: Wound Cleanser (Generic) Every Other Day/30 Days Discharge Instructions: Cleanse the wound with wound cleanser prior to applying a clean dressing using gauze sponges, not tissue or cotton balls. Topical: Gentamicin Every Other Day/30 Days Discharge Instructions: apply this week on R and L Prim Dressing: Endoform 2x2 in (Generic) Every Other Day/30 Days ary Discharge Instructions: Moisten with saline Secondary Dressing: ALLEVYN Heel 4 1/2in x 5 1/2in / 10.5cm x 13.5cm (Generic) Every Other Day/30 Days Discharge Instructions: Apply over primary dressing as directed. Secondary Dressing: Woven Gauze Sponge, Non-Sterile 4x4 in Every Other Day/30 Days Discharge Instructions:  Apply over primary dressing as directed. Secured With: Child psychotherapist, Sterile 2x75 (in/in) (Generic) Every Other Day/30 Days Discharge Instructions: Secure with stretch gauze as directed. Secured With: 67M Medipore H Soft Cloth Surgical T ape, 4 x 10 (in/yd) (Generic) Every  Other Day/30 Days Discharge Instructions: Secure with tape as directed. Wound #2 - Calcaneus Wound Laterality: Left Cleanser: Normal Saline (Generic) 1 x Per Week/30 Days Discharge Instructions: Cleanse the wound with Normal Saline prior to applying a clean dressing using gauze sponges, not tissue or cotton balls. Cleanser: Wound Cleanser 1 x Per Week/30 Days Discharge Instructions: Cleanse the wound with wound cleanser prior to applying a clean dressing using gauze sponges, not tissue or cotton balls. Topical: TCC 1 x Per Week/30 Days Discharge Instructions: to left leg (on hold 06/23/22) Topical: keystone 1 x Per Week/30 Days Discharge Instructions: use Prim Dressing: Endoform 2x2 in 1 x Per Week/30 Days ary Discharge Instructions: Moisten with saline. Put on after appt. with [facility]11/9/23 Secondary Dressing: Optifoam Non-Adhesive Dressing, 4x4 in (Generic) 1 x Per Week/30 Days Discharge Instructions: Apply over primary dressing as directed. Secondary Dressing: Woven Gauze Sponge, Non-Sterile 4x4 in 1 x Per Week/30 Days Discharge Instructions: Apply over primary dressing as directed. Secured With: 67M Medipore H Soft Cloth Surgical T ape, 4 x 10 (in/yd) (Generic) 1 x Per Week/30 Days Discharge Instructions: Secure with tape as directed. MESIAH, MANZO ([id number]) [id number] Page 8 of 16 Electronic Signature([name]) Signed: 06/23/2022 10:58:45 AM By: [name] Maudlin MD FACS Entered By: [name] on 06/23/2022 10:35:15 -------------------------------------------------------------------------------- Problem List Details Patient Name: Date of Service: [name], [name] 06/23/2022 9:30 A M Medical Record Number: [id number] Patient Account Number: [id number] Date of Birth/Sex: Treating RN: 18-Jul-1976 (46 y.o. M) Primary Care Provider: Cristie [name] Other Clinician: Referring Provider: Treating Provider/Extender: [name] in Treatment: 101 Active Problems ICD-10 Encounter Code Description Active Date MDM Diagnosis L97.512 Non-pressure chronic ulcer of other part of right foot with fat layer exposed 09/03/2020 No Yes L97.522 Non-pressure chronic ulcer of other part of left foot with fat layer exposed 09/03/2020 No Yes Inactive Problems ICD-10 Code Description Active Date Inactive Date L89.893 Pressure ulcer of other site, stage 3 07/15/2020 07/15/2020 M62.81 Muscle weakness (generalized) 07/15/2020 07/15/2020 I10 Essential (primary) hypertension 07/15/2020 07/15/2020 M86.171 Other acute osteomyelitis, right ankle and foot 09/03/2020 09/03/2020 Resolved Problems Electronic Signature([name]) Signed: 06/23/2022 10:23:14 AM By: [name] Maudlin MD FACS Entered By: [name] on 06/23/2022 10:23:13 -------------------------------------------------------------------------------- Progress Note Details Patient Name: Date of Service: [name], [name] 06/23/2022 9:30 A M Medical Record Number: [id number] Patient Account Number: [id number] Date of Birth/Sex: Treating RN: October 16, 1975 (46 y.o. Marvel [employer] ([id number]) [id number] Page 9 of 16 Primary Care Provider: Cristie [name] Other Clinician: Referring Provider: Treating Provider/Extender: [name] in Treatment: 101 Subjective Chief Complaint Information obtained from Patient Bilateral Plantar Foot Ulcers History of Present Illness (HPI) Wounds are12/03/2020 upon evaluation today patient presents for initial inspection here in our clinic concerning issues he has been having with the bottoms of his feet bilaterally. He states these actually occurred as wounds when he was hospitalized for 5 months secondary to Covid. He was apparently with tilting bed where he was in an upright position quite frequently and apparently this occurred in some way shape or form during that time. Fortunately there is no  sign of active infection at this time. No fevers, chills, nausea, vomiting, or diarrhea. With that  being said he still has substantial wounds on the plantar aspects of his feet Theragen require quite a bit of work to get these to heal. He has been using Santyl currently though that is been problematic both in receiving the medication as well as actually paid for it as it is become quite expensive. Prior to the experience with Covid the patient really did not have any major medical problems other than hypertension he does have some mild generalized weakness following the Covid experience. 07/22/2020 on evaluation today patient appears to be doing okay in regard to his foot ulcers I feel like the wound beds are showing signs of better improvement that I do believe the Iodoflex is helping in this regard. With that being said he does have a lot of drainage currently and this is somewhat blue/green in nature which is consistent with Pseudomonas. I do think a culture today would be appropriate for [place] to evaluate and see if that is indeed the case I would likely start him on antibiotic orally as well he is not allergic to Cipro knows of no issues he has had in the past 12/21; patient was admitted to the clinic earlier this month with bilateral presumed pressure ulcers on the bottom of his feet apparently related to excessive pressure from a tilt table arrangement in the intensive care unit. Patient relates this to being on ECMO but I am not really sure that is exactly related to that. I must say I have never seen anything like this. He has fairly extensive full-thickness wounds extending from his heel towards his midfoot mostly centered laterally. There is already been some healing distally. He does not appear to have an arterial issue. He has been using gentamicin to the wound surfaces with Iodoflex to help with ongoing debridement 1/6; this is a patient with pressure ulcers on the bottom of his feet related  to excessive pressure from a standing position in the intensive care unit. He is complaining of a lot of pain in the right heel. He is not a diabetic. 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 We have been using Iodoflex to help prepare the surfaces of both wounds for an advanced treatment product. He is nonambulatory spending most of his time in a wheelchair I have asked him not to propel the wheelchair with his heels 1/13; in general his wounds look better not much surface area change we have been using Iodoflex as of last week. I did an x-ray of the right heel as the patient was complaining of pain. I had some thoughts about a stress fracture perhaps Achilles tendon problems however what it showed was erosive changes along the inferior aspect of the calcaneus he now has a MRI booked for 1/20. 1/20; in general his wounds continue to be better. Some improvement in the large narrow areas proximally in his foot. He is still complaining of pain in the right heel and tenderness in certain areas of this wound. His MRI is tonight. I am not just looking for osteomyelitis that was brought up on the x-ray I am wondering about stress fractures, tendon ruptures etc. He has no such findings on the left. Also noteworthy is that the patient had critical illness neuropathy and some of the discomfort may be actual improvement in nerve function I am just not sure. These wounds were initially in the setting of severe critical illness related to COVID-19. He was put in a standing position. He may have also been  on pressors at the point contributing to tissue ischemia. By his description at some point these wounds were grossly necrotic extending proximally up into the Achilles part of his heel. I do not know that I have ever really seen pictures of them like this although they may exist in epic We have ordered Tri layer Oasis. I am trying to stimulate some granulation in these areas. This is of  course assuming the MRI is negative for infection 1/27; since the patient was last here he saw Dr. Juleen China of infectious disease. He is planned for vancomycin and ceftriaxone. Prior operative culture grew MSSA. Also ordered baseline lab work. He also ordered arterial studies although the ABIs in our clinic were normal as well as his clinical exam these were normal I do not think he needs to see vascular surgery. His ABIs at the [facility] were 1.22 in the right triphasic waveforms with a normal TBI of 1.15 on the left ABI of 1.22 with triphasic waveforms and a normal TBI of 1.08. Finally he saw Dr. Amalia Hailey who will follow him in for 2 months. At this point I do not think he felt that he needed a procedure on the right calcaneal bone. Dr. Juleen China is elected for broad-spectrum antibiotic The patient is still having pain in the right heel. He walks with a walker 2/3; wounds are generally smaller. He is tolerating his IV antibiotics. I believe this is vancomycin and ceftriaxone. We are still waiting for Oasis burn in terms of his out-of-pocket max which he should be meeting soon given the IV antibiotics, MRIs etc. I have asked him to check in on this. We are using silver collagen in the meantime the wounds look better 2/10; tolerating IV vancomycin and Rocephin. We are waiting to apply for Oasis. Although I am not really sure where he is in his out-of-pocket max. 2/17 started the first application of Oasis trilayer. Still on antibiotics. The wounds have generally look better. The area on the left has a little more surface slough requiring debridement 5/45; second application of Oasis trilayer. The wound surface granulation is generally look better. The area on the left with undermining laterally I think is come in a bit. 10/08/2020 upon evaluation today patient is here today for [employer] application #3. Fortunately he seems to be doing extremely well with regard to this and we are seeing a lot of new  epithelial growth which is great news. Fortunately there is no signs of active infection at this time. 10/16/2020 upon evaluation today patient appears to be doing well with regard to his foot ulcers. Do believe the Oasis has been of benefit for him. I do not see any signs of infection right now which is great news and I think that he has a lot of new epithelial growth which is great to see as well. The patient is very pleased to hear all of this. I do think we can proceed with the Oasis trilayer #4 today. 3/18; not as much improvement in these areas on his heels that I was hoping. I did reapply trilateral Oasis today the tissue looks healthier but not as much fill in as I was hoping. 3/25; better looking today I think this is come in a bit the tissue looks healthier. Triple layer Oasis reapplied #6 4/1; somewhat better looking definitely better looking surface not as much change in surface area as I was hoping. He may be spending more time [place] on days then he needs to although he  does have heel offloading boots. Triple layer Oasis reapplied #7 4/7; unfortunately apparently [facility] will not approve any further Oasis which is unfortunate since the patient did respond nicely both in terms of the condition of the wound bed as well as surface area. There is still some drainage coming from the wound but not a lot there does not appear to be any infection 4/15; we have been using Hydrofera Blue. He continues to have nice rims of epithelialization on the right greater than the left. The left the epithelialization is coming from the tip of his heel. There is moderate drainage. In this that concerns me about a total contact cast. There is no evidence of infection 4/29; patient has been using Hydrofera Blue with dressing changes. He has no complaints or issues today. 5/5; using Hydrofera Blue. I actually think that he looks marginally better than the last time I saw this 3 weeks ago. There are  rims of epithelialization on the left thumb coming from the medial side on the right. Using Hydrofera Blue 5/12; using Hydrofera Blue. These continue to make improvements in surface area. His drainage was not listed as severe I therefore went ahead and put a DIVANTE, KOTCH ([id number]) [id number] Page 10 of 16 cast on the left foot. Right foot we will continue to dress his previous 5/16; back for first total contact cast change. He did not tolerate this particularly well cast injury on the anterior tibia among other issues. Difficulty sleeping. I talked him about this in some detail and afterwards is elected to continue. I told him I would like to have a cast on for 3 weeks to see if this is going to help at all. I think he agreed 5/19; I think the wound is better. There is no tunneling towards his midfoot. The undermining medially also looks better. He has a rim of new skin distally. I think we are making progress here. The area on the left also continues to look somewhat better to me using Hydrofera Blue. He has a list of complaints about the cast but none of them seem serious 5/26; patient presents for 1 week follow-up. He has been using a total contact cast and tolerating this well. Hydrofera Blue is the main dressing used. He denies signs of infection. 6/2 Hydrofera Blue total contact cast on the left. These were large ulcers that formed in intensive care unit where the patient was recovering from [place]. May have had something to do with being ventilated in an upright positiono Pressors etc. We have been able to get the areas down considerably and a viable surface. There is some epithelialization in both sides. Note made of drainage 6/9; changed to [facility] last time because of drainage. He arrives with better looking surfaces and dimensions on the left than the right. Paradoxically the right actually probes more towards his midfoot the left is largely close  down but both of these look improved. Using a total contact cast on the left 6/16; complex wounds on his bilateral plantar heels which were initially pressure injury from a stay in the ICU with COVID. We have been using silver alginate most recently. His dimensions of come in quite dramatically however not recently. We have been putting the left foot in a total contact cast 6/23; complex wounds on the bilateral plantar heels. I been putting the left in the cast paradoxically the area on the right is the one that is going towards closure at a faster  rate. Quite a bit of drainage on the left. The patient went to see Dr. Amalia Hailey who said he was going to standby for skin grafts. I had actually considered sending him for skin grafts however he would be mandatorily off his feet for a period of weeks to months. I am thinking that the area on the right is going to close on its own the area on the left has been more stubborn even though we have him in a total contact cast 6/30; took him out of a total contact cast last week is the right heel seem to be making better progress than the left where I was placing the cast. We are using silver alginate. Both wounds are smaller right greater than left 7/12; both wounds look as though they are making some progress. We are using silver alginate. Heel offloading boots 7/26; very gradual progress especially on the right. Using silver alginate. He is wearing heel offloading boots 8/18; he continues to close these wounds down very gradually. Using silver alginate. The problem polymen being definitive about this is areas of what appears to be callus around the margins. This is not a 100% of the area but certainly sizable especially on the right 9/1; bilateral plantar feet wounds secondary to prolonged pressure while being ventilated for COVID-19 in an upright position. Essentially pressure ulcers on the bottom of his feet. He is made substantial progress using silver  alginate. 9/14; bilateral plantar feet wounds secondary to prolonged pressure. Making progress using silver alginate. 9/29 bilateral plantar feet wounds secondary to prolonged pressure. I changed him to Iodoflex last week. MolecuLight showing reddened blush fluorescence 10/11; patient presents for follow-up. He has no issues or complaints today. He denies signs of infection. He continues to use Iodoflex and antibiotic ointment to the wound beds. 10/27; 2-week follow-up. No evidence of infection. He has callus and thick dry skin around the wound margins we have been using Iodoflex and Bactroban which was in response to a moderate left MolecuLight reddish blush fluorescence. 11/10; 2-week follow-up. Wound margins again have thick callus however the measurements of the actual wound sites are a lot smaller. Everything looks reasonably healthy here. We have been using Iodoflex He was approved for prime matrix but I have elected to delay this given the improvement in the surface area. Hopefully I will not regret that decision as were getting close to the end of the year in terms of insurance payment 12/8; 2-week follow-up. Wounds are generally smaller in size. These were initially substantial wounds extending into the forefoot all the way into the heel on the bilateral plantar feet. They are now both located on the plantar heel distal aspect both of these have a lot of callus around the wounds I used a #5 curette to remove this on the right and the left also some subcutaneous debris to try and get the wound edges were using Iodoflex. He has heel offloading shoe 12/22; 2-week follow-up. Not really much improvement. He has thick callus around the outer edges of both wounds. I remove this there is some nonviable subcutaneous tissue as well. We have been using Iodoflex. Her intake nurse and myself spontaneously thought of a total contact cast I went back in May. At that time we really were not seeing much of  an improvement with a cast although the wound was in a much different situation I would like to retry this in 2 weeks and I discussed this with the patient 08/12/2021; the patient has  had some improvement with the Iodoflex. The the area on the left heel plantar more improved than the right. I had to put him in a total contact cast on the left although I decided to put that off for 2 weeks. I am going to change his primary dressing to silver collagen. I think in both areas he has had some improvement most of the healing seems to be more proximal in the heel. The wounds are in the mid aspect. A lot of thick callus on the right heel however. 1/19; we are using silver collagen on both plantar heel areas. He has had some improvement today. The left did not require any debridement. He still had some eschar on the right that was debrided but both seem to have contracted. I did not put it total contact cast on him today 2/2 we have been using silver collagen. The area on the right plantar heel has areas that appear to be epithelialized interspersed with dry flaking callus and dry skin. I removed this. This really looks better than on the other side. On the left still a large area with raised edges and debris on the surface. 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 He has no issues or complaints today. 2/9; not much change in the left foot wound with 1 week of a cast we are using silver collagen. Silver collagen on the right side. The right side has been the better wound surface. We will reapply the total contact cast on the left 2/16; not much improvement on either side I been using silver collagen with a total contact cast on the left. I'm changing the Hydrofera Blue still with a total contact cast on the left 2/23; some improvement on both sides. Disappointing that he has  thick callus around the area that we are putting in a total contact cast on the left. We've been using Hydrofera Blue on both wound areas. This is a man who at essentially pressure ulcers in addition to ischemia caused by medications to support his blood pressure (pressors) in the ICU. He was being ventilated in the standing position for severe Covid. A Shiley the wounds extended across his entire foot but are now localized to his plantar heels bilaterally. We have made progress however neither areas healed. I continue to think the total contact cast is helped albeit painstakingly slowly. He has never wanted a plastic surgery consult although I don't know that they would be interested in grafting in area in this location. 10/07/2021: Continued improvement bilaterally. There is still some callus around the left wound, despite the total contact cast. He has some increased pain in his right midfoot around 1 particular area. This has been painful in the past but seems to be a little bit worse. When his cast was removed today, there was an area on the heel of the left foot that looks a bit macerated. He is also complaining of pain in his left thigh and hip which he thinks is secondary to the limb length discrepancy caused by the cast. 10/14/2021: He continues to improve. A little bit less callus around the left wound. He continues to endorse pain in his right midfoot, but this is not as significant as it was last week. The maceration on his left heel is improved. 10/21/2021: Continued improvement to both wounds. The maceration on his left  heel is no longer evident. Less callus bilaterally. Epithelialization progressing. 10/28/2021: Significant improvement this week. The right sided wound is nearly closed with just a small open area at the middle. No maceration seen on the left heel. Continued epithelialization on both sides. No concern for infection. 11/04/2021: T oday, the wounds were measured a little bit  differently and come out as larger, but I actually think they are about the same to potentially even smaller, particularly on the left. He continues to accumulate some callus on the right. LE, FAULCON ([id number]) [id number] Page 11 of 16 11/11/2021: T oday, the patient is expressing some concern that the left wound, despite being in the total contact cast, is not progressing at the same rate as the 1 on the right. He is interested in trying a week without the cast to see how the wound does. The wounds are roughly the same size as last week, with the right perhaps being a little bit smaller. He continues to build up callus on both sites. 11/18/2021: Last week, I permitted the patient to go without his total contact cast, just to see if the cast was really making any difference. Today, both wounds have deteriorated to some extent, suggesting that the cast is providing benefit, at least on the left. Both are larger and have accumulated callus, slough, and other debris. 11/26/2021: I debrided both wounds quite aggressively last week in an effort to stimulate the healing cascade. This appears to have been effective as the left sided wound is a full centimeter shorter in length. Although the right was measured slightly larger, on inspection, it looks as though an area of epithelialized tissue was included in the measurements. We have been using PolyMem Ag on the wound surfaces with a total contact cast to the left. 12/02/2021: It appears that the intake personnel are including epithelialized tissue in his wound measurements; the right wound is almost completely epithelialized; there is just a crater at the proximal midfoot with some open areas. On the left, he has built up some callus, but the overall wound surface looks good. There is some senescent skin around the wound margin. He has been in PolyMem Ag bilaterally with a total contact cast on the left. 12/09/2021: The right  wound is nearly closed; there is just a small open area at the mid calcaneus. On the left, the wound is smaller with minimal callus buildup. No significant drainage. 12/16/2021: The right calcaneal wound remains minimally open at the mid calcaneus; the rest has epithelialized. On the left, the wound is also a little bit smaller. There is some senescent tissue on the periphery. He is getting his first application of a trial skin substitute called Vendaje today. 12/23/2021: The wound on his right calcaneus is nearly closed; there is just a small area at the most distal aspect of the calcaneus that is open. On the left, the area where we applied to the skin substitute has a healthier-looking bed of granulation tissue. The wound dimensions are not significantly different on this side but the wound surface is improved. 12/30/2021: The wound on the right calcaneus has not changed significantly aside from some accumulation of callus. On the left, the open area is smaller and continues to have an improved surface. He continues to accumulate callus around the wound. He is here for his third application of Vendaje. 01/06/2022: The right calcaneal wound is down to just a couple of millimeters. He continues to accumulate periwound callus. He unfortunately got his cast  wet earlier in the week and his left foot is macerated, resulting in some superficial skin loss just distal to the open wound. The open wound itself, however, is much smaller and has a healthier appearing surface. He is here for his fourth application of Vendaje. 01/13/2022: The right calcaneal wound is about the same. Unfortunately, once again, his cast got wet and his foot is again macerated. This is caused the left calcaneal wound to enlarge. He is here for his fifth application of Vendaje. 01/20/2022: The right calcaneal wound is very small. There is some periwound callus accumulation. He purchased a new cast protector last week and this has been  effective in avoiding the maceration that has been occurring on the left. The left calcaneal wound is narrower and has a healthy and viable-appearing surface. He is here for his 6 application of Vendaje. 01/27/2022: The right calcaneal wound is down to just a pinhole. There is some periwound slough and callus. On the left, the wound is narrower and shorter by about a centimeter. The surface is robust and viable-appearing. Unfortunately, the rep for the trial skin substitute product did not provide any for [place] to use today. 02/04/2022: The right calcaneal wound remains unchanged. There is more accumulated callus. On the left, although the intake nurse measured it a little bit longer, it looks about the same to me. The surface has a layer of slough, but underneath this, there is good granulation tissue. 02/10/2022: The right calcaneus wound is nearly closed. There is still some callus that builds up around the site. The left side looks about the same in terms of dimensions, but the surface is more robust and vital-appearing. 02/16/2022: The area of the right calcaneus that was nearly closed last week has closed, but there is a small opening at the mid foot where it looks like some moisture got retained and caused some reopening. The left foot wound is narrower and shallower. Both sites have a fair amount of periwound callus and eschar. 02/24/2022: The small midfoot opening on the right calcaneus is a little bit smaller today. The left foot wound is narrower and shallower. He continues to accumulate periwound callus. No concern for infection. 03/01/2022: The patient came to clinic early because he showered and got his cast wet. Fortunately, there is no significant maceration to his foot but the callus softened and it looks like the wound on his left calcaneus may be a little bit wider. The wound on his right calcaneus is just a narrow slit. Continued accumulation of periwound callus bilaterally. 03/08/2022: The  wound on his right calcaneus is very nearly closed, just a small pinpoint opening under a bit of eschar; the left wound has come in quite a bit, as well. It is narrower and shorter than at our last visit. Still with accumulated callus and eschar bilaterally. 03/17/2022: The right calcaneal wound is healed. The left wound is smaller and the surface itself is very clean, but there is some blue-green staining on the periwound callus, concerning for Pseudomonas aeruginosa. 03/23/2022: The right calcaneal wound remains closed. The left wound continues to contract. No further blue-green staining. Small amount of callus and slough accumulation. 03/28/2022: He came in early today because he had gotten his cast wet. On inspection, the wound itself did not get wet or macerated, just a little bit of the forefoot. The wound itself is basically unchanged. 04/07/2022: The right foot wound remains closed. The left wound is the smallest that I have seen it to  date. It is narrower and shorter. It still continues to accumulate slough on the surface. 04/15/2022: There is a band of epithelium now dividing the small left plantar foot wound in 2. There is still some slough on the surface. 04/21/2022: The wound continues to narrow. Just a little bit of slough on the surface. He seems to be responding well to endoform. 04/28/2022: Continued slow contraction of the wound. There is a little slough on the surface and some periwound callus. We have been using endoform and total contact cast. 05/05/2022: The wound appears to have stalled. There is slough and some periwound eschar/callus. No concern for infection, however. 05/12/2022: Unfortunately, his right foot has reopened. It is located at the most posterior aspect of his surgical incision. The area was noted to have drainage coming from it when his padding was removed today. Underneath some callus and senescent skin, there is an opening. No purulent drainage or malodor. On the left  foot, the wound is again unchanged. There is some light blue staining on the callus, but no malodor or purulent drainage. 10/13; right and left heel remanence of extensive plantar foot wounds. These are better than I remember by quite a big margin however he is still left with wounds on the left plantar heel and the right plantar heel. Been using endoform bilaterally. A culture was done that showed apparently Pseudomonas but we are still waiting for the [facility] antibiotic to use gentamicin today. He is still very active by description I am not sure about the offloading of his noncasted right foot POLO, MCMARTIN ([id number]) [id number] Page 12 of 16 10/20; both wounds right and left heel debrided not much change from last week. [employer] has arrived which is linezolid, gentamicin and ciprofloxacin we will use this with endoform. T contact cast on the left otal 06/02/2022: Both wounds are smaller today. There is still a fair amount of callus buildup around the right foot ulcer. The left is more superficial and nearly flush with the surrounding tissues. Also with slough and eschar buildup. 06/10/2022: The right sided wound appears to be nearly closed, if not completely so, although it is somewhat difficult to tell given the abnormal tissue and scarring in his foot. There is a fair amount of callus and crust accumulation. On the left, the wound looks about the same, again with callus and slough. He has an appointment next Thursday with Dr. Loel Lofty in podiatry; I am hopeful that there may be some reconstructive options available for Mr. Humber. 06/16/2022: Both wounds have some eschar and callus accumulation. The right sided wound is extremely narrow and barely open; the left is narrower than last week. There is a little bit of slough. He has his appointment in podiatry later today. 06/23/2022: The patient met with Dr. Loel Lofty last week and unfortunately, there are no  reconstructive options that he believes would be helpful. He did order an MRI to evaluate for osteomyelitis and fortunately, none was seen. The left sided wound is a little bit shorter and narrower today. The right sided wound is about the same. There is callus and eschar accumulation bilaterally. Patient History Information obtained from Patient. Family History Cancer - Maternal Grandparents, Diabetes - Father,Paternal Grandparents, Heart Disease - Maternal Grandparents, Hypertension - Father,Paternal Grandparents, Lung Disease - Siblings, No family history of Hereditary Spherocytosis, Kidney Disease, Seizures, Stroke, Thyroid Problems, Tuberculosis. Social History Never smoker, Marital Status - Married, Alcohol Use - Never, Drug Use - No History, Caffeine Use - Daily - tea,  soda. Medical History Eyes Denies history of Cataracts, Glaucoma, Optic Neuritis Ear/Nose/Mouth/Throat Denies history of Chronic sinus problems/congestion, Middle ear problems Hematologic/Lymphatic Denies history of Anemia, Hemophilia, Human Immunodeficiency Virus, Lymphedema, Sickle Cell Disease Respiratory Patient has history of Asthma Denies history of Aspiration, Chronic Obstructive Pulmonary Disease (COPD), Pneumothorax, Sleep Apnea, Tuberculosis Cardiovascular Patient has history of Angina - with COVID, Hypertension Denies history of Arrhythmia, Congestive Heart Failure, Coronary Artery Disease, Deep Vein Thrombosis, Hypotension, Myocardial Infarction, Peripheral Arterial Disease, Peripheral Venous Disease, Phlebitis, Vasculitis Gastrointestinal Denies history of Cirrhosis , Colitis, Crohnoos, Hepatitis A, Hepatitis B, Hepatitis C Endocrine Denies history of Type I Diabetes, Type II Diabetes Genitourinary Denies history of End Stage Renal Disease Immunological Denies history of Lupus Erythematosus, Raynaudoos, Scleroderma Integumentary (Skin) Denies history of History of Burn Musculoskeletal Denies  history of Gout, Rheumatoid Arthritis, Osteoarthritis, Osteomyelitis Neurologic Denies history of Dementia, Neuropathy, Quadriplegia, Paraplegia, Seizure Disorder Oncologic Denies history of Received Chemotherapy, Received Radiation Psychiatric Denies history of Anorexia/bulimia, Confinement Anxiety Hospitalization/Surgery History - COVID PNA 07/22/2019- 11/14/2019. - 03/27/2020 wound debridement/ skin graft. Medical A Surgical History Notes nd Constitutional Symptoms (General Health) COVID PNA 07/22/2019-11/14/2019 VENT ECMO, foot drop left foot , Genitourinary kidney stone Psychiatric anxiety Objective Constitutional No acute distress.. Vitals Time Taken: 9:52 AM, Height: 69 in, Weight: 280 lbs, BMI: 41.3, Temperature: 98.3 F, Pulse: 76 bpm, Respiratory Rate: 16 breaths/min, Blood Pressure: 121/77 mmHg. Respiratory DEMETRIES, COIA ([id number]) [id number] Page 13 of 16 Normal work of breathing on room air.. General Notes: 06/23/2022: The left sided wound is a little bit shorter and narrower today. The right sided wound is about the same. There is callus and eschar accumulation bilaterally. Integumentary (Hair, Skin) Wound #1 status is Open. Original cause of wound was Pressure Injury. The date acquired was: 10/07/2019. The wound has been in treatment 101 weeks. The wound is located on the Right Calcaneus. The wound measures 0.1cm length x 0.1cm width x 0.1cm depth; 0.008cm^2 area and 0.001cm^3 volume. There is Fat Layer (Subcutaneous Tissue) exposed. There is no tunneling or undermining noted. There is a medium amount of serosanguineous drainage noted. The wound margin is thickened. There is small (1-33%) pink granulation within the wound bed. There is a large (67-100%) amount of necrotic tissue within the wound bed including Eschar and Adherent Slough. The periwound skin appearance had no abnormalities noted for moisture. The periwound skin appearance had  no abnormalities noted for color. The periwound skin appearance exhibited: Callus. Periwound temperature was noted as No Abnormality. Wound #2 status is Open. Original cause of wound was Pressure Injury. The date acquired was: 10/07/2019. The wound has been in treatment 101 weeks. The wound is located on the Left Calcaneus. The wound measures 2.7cm length x 1.2cm width x 0.2cm depth; 2.545cm^2 area and 0.509cm^3 volume. There is Fat Layer (Subcutaneous Tissue) exposed. There is no tunneling or undermining noted. There is a medium amount of serosanguineous drainage noted. The wound margin is thickened. There is medium (34-66%) pink granulation within the wound bed. There is a medium (34-66%) amount of necrotic tissue within the wound bed including Eschar and Adherent Slough. The periwound skin appearance had no abnormalities noted for color. The periwound skin appearance exhibited: Callus, Scarring, Maceration. Periwound temperature was noted as No Abnormality. Assessment Active Problems ICD-10 Non-pressure chronic ulcer of other part of right foot with fat layer exposed Non-pressure chronic ulcer of other part of left foot with fat layer exposed Procedures Wound #1 Pre-procedure diagnosis of Wound #1 is a Pressure  Ulcer located on the Right Calcaneus . There was a Excisional Skin/Subcutaneous Tissue Debridement with a total area of 0.01 sq cm performed by [name] Maudlin, MD. With the following instrument([name]): Curette to remove Non-Viable tissue/material. Material removed includes Callus, Subcutaneous Tissue, and Slough after achieving pain control using Lidocaine 4% T opical Solution. No specimens were taken. A time out was conducted at 10:10, prior to the start of the procedure. A Minimum amount of bleeding was controlled with Pressure. The procedure was tolerated well with a pain level of 0 throughout and a pain level of 0 following the procedure. Post Debridement Measurements: 0.1cm length x  0.1cm width x 0.1cm depth; 0.001cm^3 volume. Post debridement Stage noted as Category/Stage III. Character of Wound/Ulcer Post Debridement is improved. Post procedure Diagnosis Wound #1: Same as Pre-Procedure Wound #2 Pre-procedure diagnosis of Wound #2 is a Pressure Ulcer located on the Left Calcaneus . There was a Excisional Skin/Subcutaneous Tissue Debridement with a total area of 3.24 sq cm performed by [name] Maudlin, MD. With the following instrument([name]): Curette to remove Non-Viable tissue/material. Material removed includes Callus, Subcutaneous Tissue, and Slough after achieving pain control using Lidocaine 4% T opical Solution. No specimens were taken. A time out was conducted at 10:10, prior to the start of the procedure. A Minimum amount of bleeding was controlled with Pressure. The procedure was tolerated well with a pain level of 0 throughout and a pain level of 0 following the procedure. Post Debridement Measurements: 2.7cm length x 1.2cm width x 0.2cm depth; 0.509cm^3 volume. Post debridement Stage noted as Category/Stage III. Character of Wound/Ulcer Post Debridement is improved. Post procedure Diagnosis Wound #2: Same as Pre-Procedure General Notes: Scribed for Dr. [name] by J.Scotton. Plan Follow-up Appointments: Return Appointment in 1 week. - Dr. [name] Room 3 11/22 at 10:15 AM Anesthetic: Wound #2 Left Calcaneus: (In clinic) Topical Lidocaine 4% applied to wound bed - In clinic Bathing/ Shower/ Hygiene: May shower with protection but do not get wound dressing([name]) wet. - Cover with cast protector (can purchase cast protector at [facility] or [facility] (Cast on hold 06/23/22) Edema Control - Lymphedema / SCD / Other: Elevate legs to the level of the heart or above for 30 minutes daily and/or when sitting, a frequency of: - throughout the day Avoid standing for long periods of time. Moisturize legs daily. - as needed Off-Loading: T Contact Cast to Left Lower Extremity - TCC to  Left LL (T Cast on hold this week-06/23/22) otal otal Other: - keep pressure off of the bottom of your feet. Globoped to left foot-when not wearing the cast Elevate legs throughout the day Additional Orders / Instructions: Follow Nutritious Diet WOUND #1: - Calcaneus Wound Laterality: Right Cleanser: Normal Saline (Generic) Every Other Day/30 Days Discharge Instructions: Cleanse the wound with Normal Saline prior to applying a clean dressing using gauze sponges, not tissue or cotton balls. Cleanser: Wound Cleanser (Generic) Every Other Day/30 Days [name], SCHARA ([id number]) [id number].pdf Page 14 of 16 Discharge Instructions: Cleanse the wound with wound cleanser prior to applying a clean dressing using gauze sponges, not tissue or cotton balls. Topical: Gentamicin Every Other Day/30 Days Discharge Instructions: apply this week on R and L Prim Dressing: Endoform 2x2 in (Generic) Every Other Day/30 Days ary Discharge Instructions: Moisten with saline Secondary Dressing: ALLEVYN Heel 4 1/2in x 5 1/2in / 10.5cm x 13.5cm (Generic) Every Other Day/30 Days Discharge Instructions: Apply over primary dressing as directed. Secondary Dressing: Woven Gauze Sponge, Non-Sterile 4x4 in Every Other Day/30  Days Discharge Instructions: Apply over primary dressing as directed. Secured With: Child psychotherapist, Sterile 2x75 (in/in) (Generic) Every Other Day/30 Days Discharge Instructions: Secure with stretch gauze as directed. Secured With: 41M Medipore H Soft Cloth Surgical T ape, 4 x 10 (in/yd) (Generic) Every Other Day/30 Days Discharge Instructions: Secure with tape as directed. WOUND #2: - Calcaneus Wound Laterality: Left Cleanser: Normal Saline (Generic) 1 x Per Week/30 Days Discharge Instructions: Cleanse the wound with Normal Saline prior to applying a clean dressing using gauze sponges, not tissue or cotton balls. Cleanser: Wound Cleanser 1 x Per Week/30  Days Discharge Instructions: Cleanse the wound with wound cleanser prior to applying a clean dressing using gauze sponges, not tissue or cotton balls. Topical: TCC 1 x Per Week/30 Days Discharge Instructions: to left leg (on hold 06/23/22) Topical: keystone 1 x Per Week/30 Days Discharge Instructions: use Prim Dressing: Endoform 2x2 in 1 x Per Week/30 Days ary Discharge Instructions: Moisten with saline. Put on after appt. with [facility]11/9/23 Secondary Dressing: Optifoam Non-Adhesive Dressing, 4x4 in (Generic) 1 x Per Week/30 Days Discharge Instructions: Apply over primary dressing as directed. Secondary Dressing: Woven Gauze Sponge, Non-Sterile 4x4 in 1 x Per Week/30 Days Discharge Instructions: Apply over primary dressing as directed. Secured With: 41M Medipore H Soft Cloth Surgical T ape, 4 x 10 (in/yd) (Generic) 1 x Per Week/30 Days Discharge Instructions: Secure with tape as directed. 06/23/2022: The left sided wound is a little bit shorter and narrower today. The right sided wound is about the same. There is callus and eschar accumulation bilaterally. I used a curette to debride slough, callus, nonviable subcutaneous tissue and eschar from both foot wounds. He forgot to bring his Keystone topical antibiotic compound today. We will apply gentamicin to the left foot and endoform to both feet. They will use his Keystone at home. He has done fairly well out of his total contact cast so I want to see if he is able to maintain this for another week. If the wound deteriorates, we may need to reconsider the cast. Follow-up in 1 week. Electronic Signature([name]) Signed: 06/23/2022 10:37:46 AM By: [name] Maudlin MD FACS Previous Signature: 06/23/2022 10:37:17 AM Version By: [name] Maudlin MD FACS Entered By: [name] on 06/23/2022 10:37:46 -------------------------------------------------------------------------------- HxROS Details Patient Name: Date of Service: [name],  [name] 06/23/2022 9:30 A M Medical Record Number: [id number] Patient Account Number: [id number] Date of Birth/Sex: Treating RN: 22-Oct-1975 (46 y.o. M) Primary Care Provider: Cristie [name] Other Clinician: Referring Provider: Treating Provider/Extender: [name] in Treatment: 101 Information Obtained From Patient Constitutional Symptoms (General Health) Medical History: Past Medical History Notes: COVID PNA 07/22/2019-11/14/2019 [facility] ECMO, foot drop left foot , Eyes Medical History: Negative for: Cataracts; Glaucoma; Optic Neuritis Ear/Nose/Mouth/Throat Medical HistoryGARYSON, STELLY ([id number]) [id number] Page 15 of 16 Negative for: Chronic sinus problems/congestion; Middle ear problems Hematologic/Lymphatic Medical History: Negative for: Anemia; Hemophilia; Human Immunodeficiency Virus; Lymphedema; Sickle Cell Disease Respiratory Medical History: Positive for: Asthma Negative for: Aspiration; Chronic Obstructive Pulmonary Disease (COPD); Pneumothorax; Sleep Apnea; Tuberculosis Cardiovascular Medical History: Positive for: Angina - with COVID; Hypertension Negative for: Arrhythmia; Congestive Heart Failure; Coronary Artery Disease; Deep Vein Thrombosis; Hypotension; Myocardial Infarction; Peripheral Arterial Disease; Peripheral Venous Disease; Phlebitis; Vasculitis Gastrointestinal Medical History: Negative for: Cirrhosis ; Colitis; Crohns; Hepatitis A; Hepatitis B; Hepatitis C Endocrine Medical History: Negative for: Type I Diabetes; Type II Diabetes Genitourinary Medical History: Negative for: End Stage Renal Disease Past Medical History Notes:  kidney stone Immunological Medical History: Negative for: Lupus Erythematosus; Raynauds; Scleroderma Integumentary (Skin) Medical History: Negative for: History of Burn Musculoskeletal Medical History: Negative for: Gout; Rheumatoid Arthritis; Osteoarthritis;  Osteomyelitis Neurologic Medical History: Negative for: Dementia; Neuropathy; Quadriplegia; Paraplegia; Seizure Disorder Oncologic Medical History: Negative for: Received Chemotherapy; Received Radiation Psychiatric Medical History: Negative for: Anorexia/bulimia; Confinement Anxiety Past Medical History Notes: anxiety Immunizations Pneumococcal Vaccine: Received Pneumococcal Vaccination: No Implantable Devices None Hospitalization / Surgery History DONAL, LYNAM ([id number]) [id number] Page 16 of 16 Type of Hospitalization/Surgery COVID PNA 07/22/2019- 11/14/2019 03/27/2020 wound debridement/ skin graft Family and Social History Cancer: Yes - Maternal Grandparents; Diabetes: Yes - Father,Paternal Grandparents; Heart Disease: Yes - Maternal Grandparents; Hereditary Spherocytosis: No; Hypertension: Yes - Father,Paternal Grandparents; Kidney Disease: No; Lung Disease: Yes - Siblings; Seizures: No; Stroke: No; Thyroid Problems: No; Tuberculosis: No; Never smoker; Marital Status - Married; Alcohol Use: Never; Drug Use: No History; Caffeine Use: Daily - tea, soda; Financial Concerns: No; Food, Clothing or Shelter Needs: No; Support System Lacking: No; Transportation Concerns: No Electronic Signature([name]) Signed: 06/23/2022 10:58:45 AM By: [name] Maudlin MD FACS Entered By: [name] on 06/23/2022 10:24:43 -------------------------------------------------------------------------------- SuperBill Details Patient Name: Date of Service: [name], [name] 06/23/2022 Medical Record Number: [id number] Patient Account Number: [id number] Date of Birth/Sex: Treating RN: 1976-06-25 (46 y.o. M) Primary Care Provider: Cristie [name] Other Clinician: Referring Provider: Treating Provider/Extender: [name] in Treatment: 101 Diagnosis Coding ICD-10 Codes Code Description [phone] Non-pressure chronic ulcer of other part of right foot  with fat layer exposed L97.522 Non-pressure chronic ulcer of other part of left foot with fat layer exposed Facility Procedures : CPT4 Code: [id number] Description: [place] TISSUE 20 SQ CM/< ICD-10 Diagnosis Description L97.512 Non-pressure chronic ulcer of other part of right foot with fat layer exposed L97.522 Non-pressure chronic ulcer of other part of left foot with fat layer exposed Modifier: Quantity: 1 Physician Procedures : CPT4 Code Description Modifier [id number] - WC PHYS LEVEL 4 - EST PT 25 ICD-10 Diagnosis Description L97.512 Non-pressure chronic ulcer of other part of right foot with fat layer exposed L97.522 Non-pressure chronic ulcer of other part of left foot  with fat layer exposed Quantity: 1 : [id number] - WC PHYS SUBQ TISS 20 SQ CM ICD-10 Diagnosis Description L97.512 Non-pressure chronic ulcer of other part of right foot with fat layer exposed L97.522 Non-pressure chronic ulcer of other part of left foot with fat layer exposed Quantity: 1 Electronic Signature([name]) Signed: 06/23/2022 10:38:24 AM By: [name] Maudlin MD FACS Entered By: [name] on 06/23/2022 10:38:23

## 2022-06-28 ENCOUNTER — Other Ambulatory Visit: Payer: Self-pay

## 2022-06-28 MED ORDER — OXYCODONE HCL 5 MG PO TABS: 5.0000 mg | ORAL_TABLET | Freq: Four times a day (QID) | ORAL | 0 refills | Status: DC | PRN

## 2022-06-29 ENCOUNTER — Encounter (HOSPITAL_BASED_OUTPATIENT_CLINIC_OR_DEPARTMENT_OTHER): Payer: PPO | Admitting: General Surgery

## 2022-06-29 DIAGNOSIS — L89623 Pressure ulcer of left heel, stage 3: Secondary | ICD-10-CM | POA: Diagnosis not present

## 2022-06-29 DIAGNOSIS — L89613 Pressure ulcer of right heel, stage 3: Secondary | ICD-10-CM | POA: Diagnosis not present

## 2022-06-29 DIAGNOSIS — L97512 Non-pressure chronic ulcer of other part of right foot with fat layer exposed: Secondary | ICD-10-CM | POA: Diagnosis not present

## 2022-06-30 NOTE — Progress Notes (Signed)
[name], [name] ([id number]) [id number] Page 1 of 8 Visit Report for 06/29/2022 Arrival Information Details Patient Name: Date of Service: [name], [place] 06/29/2022 10:15 A M Medical Record Number: [id number] Patient Account Number: [id number] Date of Birth/Sex: Treating RN: [birth year] (46 y.o. [name] Primary Care [name]: [name] Other Clinician: Referring [name]: Treating [name]/Extender: [name] in Treatment: 58 Visit Information History Since Last Visit Added or deleted any medications: No Patient Arrived: Wheel Chair Any new allergies or adverse reactions: No Arrival Time: 10:25 Had a fall or experienced change in No Accompanied By: spouse and son activities of daily living that may affect Transfer Assistance: None risk of falls: Patient Requires Transmission-Based Precautions: No Signs or symptoms of abuse/neglect since last visito No Patient Has Alerts: No Hospitalized since last visit: No Implantable device outside of the clinic excluding No cellular tissue based products placed in the center since last visit: Has Dressing in Place as Prescribed: Yes Pain Present Now: No Electronic Signature(s) Signed: 06/29/2022 5:22:02 PM By: [name] Entered By: [name] on 06/29/2022 10:40:03 -------------------------------------------------------------------------------- Encounter Discharge Information Details Patient Name: Date of Service: [name], [name] 06/29/2022 10:15 A M Medical Record Number: [id number] Patient Account Number: [id number] Date of Birth/Sex: Treating RN: [birth year] (46 y.o. [name] Primary Care Shalicia Craghead: [name] Other Clinician: Referring [name]: Treating [name]/Extender: [name] in Treatment: 102 Encounter Discharge Information Items Post Procedure Vitals Discharge Condition: Stable Temperature (F):  98.4 Ambulatory Status: Wheelchair Pulse (bpm): 78 Discharge Destination: Home Respiratory Rate (breaths/min): 16 Transportation: Private Auto Blood Pressure (mmHg): 135/75 Accompanied By: son and spouse Schedule Follow-up Appointment: No Clinical Summary of Care: Electronic Signature(s) Signed: 06/29/2022 5:22:02 PM By: [name] Entered By: [name] on 06/29/2022 17:20:25 [name] ([id number]) [id number] Page 2 of 8 -------------------------------------------------------------------------------- Lower Extremity Assessment Details Patient Name: Date of Service: [name] 06/29/2022 10:15 A M Medical Record Number: [id number] Patient Account Number: [id number] Date of Birth/Sex: Treating RN: 09/20/1975 (46 y.o. [name] Primary Care Mikaiya Tramble: [name] Other Clinician: Referring [name]: Treating [name]/Extender: [name] in Treatment: 102 Edema Assessment Assessed: [name]: No] [name]: No] Edema: [Left: N] [Right: o] Calf Left: Right: Point of Measurement: 29 cm From Medial Instep 42.5 cm 44.5 cm Ankle Left: Right: Point of Measurement: 9 cm From Medial Instep 24.2 cm 25.2 cm Vascular Assessment Pulses: Dorsalis Pedis Palpable: [Left:Yes] [Right:Yes] Electronic Signature(s) Signed: 06/29/2022 5:22:02 PM By: [name] Entered By: [name] on 06/29/2022 10:40:47 -------------------------------------------------------------------------------- Multi Wound Chart Details Patient Name: Date of Service: [name], [name] 06/29/2022 10:15 A M Medical Record Number: [id number] Patient Account Number: [id number] Date of Birth/Sex: Treating RN: 1976/02/27 (46 y.o. M) Primary Care [name]: [name] Other Clinician: Referring [name]: Treating [name]/Extender: [name] in Treatment: 102 Vital Signs Height(in): 69 Pulse(bpm):  81 Weight(lbs): 280 Blood Pressure(mmHg): 135/75 Body Mass Index(BMI): 41.3 Temperature(F): 98.4 Respiratory Rate(breaths/min): 16 [1:Photos: No Photos Right Calcaneus Wound Location: Pressure Injury Wounding Event: Pressure Ulcer Primary Etiology: Asthma, Angina, Hypertension Comorbid History: 10/07/2019 Date Acquired: 11 Weeks of Treatment: Open Wound Status: No Wound Recurrence:] [2:No  Photos Left Calcaneus Pressure Injury Pressure Ulcer Asthma, Angina, Hypertension 10/07/2019 102 Open No] [N/A:N/A N/A N/A N/A N/A N/A N/A N/A N/A] [name] ([id number]) [1:0.3x0.2x0.2 Measurements L x W x D (cm) 0.047 A (cm) : rea 0.009 Volume (cm) : 99.90% %  Reduction in A rea: 100.00% % Reduction in Volume: Category/Stage III Classification: Medium Exudate A mount: Serosanguineous Exudate Type: red, brown Exudate  Color: Thickened Wound Margin: Medium (34-66%) Granulation A mount: Pink Granulation Quality: Medium (34-66%) Necrotic A mount: Eschar, Adherent Slough Necrotic Tissue: Fat Layer (Subcutaneous Tissue): Yes Fat Layer (Subcutaneous Tissue): Yes N/A Exposed  Structures: Fascia: No Tendon: No Muscle: No Joint: No Bone: No Large (67-100%) Epithelialization: Debridement - Selective/Open Wound Debridement - Selective/Open Wound N/A Debridement: Pre-procedure Verification/Time Out 10:48 Taken: Lidocaine 5%  topical ointment Pain Control: Necrotic/Eschar, Slough Tissue Debrided: Non-Viable Tissue Level: 0.06 Debridement A (sq cm): rea Curette Instrument: Minimum Bleeding: Pressure Hemostasis A chieved: 0 Procedural Pain: 0 Post Procedural Pain: Procedure was  tolerated well Debridement Treatment Response: 0.3x0.2x0.2 Post Debridement Measurements L x W x D (cm) 0.009 Post Debridement Volume: (cm) Category/Stage III Post Debridement Stage: Callus: Yes Periwound Skin Texture: No Abnormalities Noted Periwound  Skin Moisture: No Abnormalities Noted Periwound Skin Color: No Abnormality Temperature:  Debridement Procedures Performed:] [2:2x0.5x0.2 0.785 0.157 97.10% 99.40% Category/Stage III Medium Serosanguineous red, brown Thickened Large (67-100%) Red, Pink  Small (1-33%) Eschar, Adherent Slough Fascia: No Tendon: No Muscle: No Joint: No Bone: No Medium (34-66%) 10:48 Lidocaine 5% topical ointment Necrotic/Eschar, Slough Non-Viable Tissue 1 Curette Minimum Pressure 0 0 Procedure was tolerated well 2x0.5x0.2  0.157 Category/Stage III Callus: Yes Scarring: Yes Maceration: Yes No Abnormalities Noted No Abnormality Debridement] [N/A:[id number] Page 3 of 8 N/A N/A N/A N/A N/A N/A N/A N/A N/A N/A N/A N/A N/A N/A N/A N/A N/A N/A N/A N/A  N/A N/A N/A N/A N/A N/A N/A N/A N/A N/A N/A N/A N/A N/A] Treatment Notes Electronic Signature(s) Signed: 06/29/2022 11:19:15 AM By: [name] MD FACS Entered By: [name] on 06/29/2022 11:19:15 -------------------------------------------------------------------------------- Multi-Disciplinary Care Plan Details Patient Name: Date of Service: [name], [name] [place] E. 06/29/2022 10:15 A M Medical Record Number: [id number] Patient Account Number: [id number] Date of Birth/Sex: Treating RN: Feb 14, 1976 (46 y.o. [name] Primary Care Valdis Bevill: Dorinda Hill Other Clinician: Referring Neeka Urista: Treating Adon Gehlhausen/Extender: Jana Hakim in Treatment: 102 Multidisciplinary Care Plan reviewed with physician Active Inactive Wound/Skin Impairment Nursing Diagnoses: Impaired tissue integrity Knowledge deficit related [name] ulceration/compromised skin integrity ARIYAN, BRISENDINE ([id number]) [phone].pdf Page 4 of 8 Goals: Patient/caregiver will verbalize understanding of skin care regimen Date Initiated: 07/15/2020 Target Resolution Date: 03/08/2023 Goal Status: Active Ulcer/skin breakdown will have a volume reduction of 30% by week 4 Date Initiated: 07/15/2020 Date Inactivated:  08/20/2020 Target Resolution Date: 09/03/2020 Goal Status: Unmet Unmet Reason: no major changes. Ulcer/skin breakdown will heal within 14 weeks Date Initiated: 12/04/2020 Date Inactivated: 12/10/2020 Target Resolution Date: 12/10/2020 Unmet Reason: wounds still open at 14 Goal Status: Unmet weeks and today 21 weeks. Interventions: Assess patient/caregiver ability [name] obtain necessary supplies Assess patient/caregiver ability [name] perform ulcer/skin care regimen upon admission and as needed Assess ulceration(s) every visit Provide education on ulcer and skin care Treatment Activities: Skin care regimen initiated : 07/15/2020 Topical wound management initiated : 07/15/2020 Notes: Electronic Signature(s) Signed: 06/29/2022 5:22:02 PM By: [name] RN Entered By: [name] on 06/29/2022 17:19:04 -------------------------------------------------------------------------------- Pain Assessment Details Patient Name: Date of Service: [name], [name] [name] 06/29/2022 10:15 A M Medical Record Number: [id number] Patient Account Number: [id number] Date of Birth/Sex: Treating RN: Jul 23, 1976 (46 y.o. [name] Primary Care Tanika Bracco: Dorinda Hill Other Clinician: Referring Kacin Dancy: Treating Makoto Sellitto/Extender: Jana Hakim in Treatment: 102 Active Problems Location of Pain  Severity and Description of Pain Patient Has Paino No Site Locations Pain Management and Medication Current Pain Management: Electronic Signature(s) Signed: 06/29/2022 5:22:02 PM By: [name] [name] ([id number]) [id number] Page 5 of 8 Entered By: [name] on 06/29/2022 10:40:38 -------------------------------------------------------------------------------- Patient/Caregiver Education Details Patient Name: Date of Service: [name] 11/22/2023andnbsp10:15 A M Medical Record Number: [id number] Patient Account Number:  [id number] Date of Birth/Gender: Treating RN: Jul 27, 1976 (46 y.o. [name] Primary Care Physician: [name] Other Clinician: Referring Physician: Treating Physician/Extender: [name] in Treatment: 102 Education Assessment Education Provided [name]: Patient Education Topics Provided Wound/Skin Impairment: Methods: Explain/Verbal Responses: Return demonstration correctly Electronic Signature(s) Signed: 06/29/2022 5:22:02 PM By: [name] Entered By: [name] on 06/29/2022 17:19:20 -------------------------------------------------------------------------------- Wound Assessment Details Patient Name: Date of Service: [name], [place] 06/29/2022 10:15 A M Medical Record Number: [id number] Patient Account Number: [id number] Date of Birth/Sex: Treating RN: 01/05/1976 (46 y.o. [name] Primary Care Jina Olenick: [name] Other Clinician: Referring Mathis Cashman: Treating Aidyn Sportsman/Extender: [name] in Treatment: 102 Wound Status Wound Number: 1 Primary Etiology: Pressure Ulcer Wound Location: Right Calcaneus Wound Status: Open Wounding Event: Pressure Injury Comorbid History: Asthma, Angina, Hypertension Date Acquired: 10/07/2019 Weeks Of Treatment: 102 Clustered Wound: No Photos Photo Uploaded By: Valeria Batman on 06/29/2022 13:54:59 [name] ([id number]) [id number] Page 6 of 8 Wound Measurements Length: (cm) 0.3 Width: (cm) 0.2 Depth: (cm) 0.2 Area: (cm) 0.047 Volume: (cm) 0.009 % Reduction in Area: 99.9% % Reduction in Volume: 100% Epithelialization: Large (67-100%) Tunneling: No Undermining: No Wound Description Classification: Category/Stage III Wound Margin: Thickened Exudate Amount: Medium Exudate Type: Serosanguineous Exudate Color: red, brown Foul Odor After Cleansing: No Slough/Fibrino Yes Wound Bed Granulation Amount: Medium (34-66%)  Exposed Structure Granulation Quality: Pink Fascia Exposed: No Necrotic Amount: Medium (34-66%) Fat Layer (Subcutaneous Tissue) Exposed: Yes Necrotic Quality: Eschar, Adherent Slough Tendon Exposed: No Muscle Exposed: No Joint Exposed: No Bone Exposed: No Periwound Skin Texture Texture Color No Abnormalities Noted: No No Abnormalities Noted: Yes Callus: Yes Temperature / Pain Temperature: No Abnormality Moisture No Abnormalities Noted: Yes Treatment Notes Wound #1 (Calcaneus) Wound Laterality: Right Cleanser Normal Saline Discharge Instruction: Cleanse the wound with Normal Saline prior [name] applying a clean dressing using gauze sponges, not tissue or cotton balls. Wound Cleanser Discharge Instruction: Cleanse the wound with wound cleanser prior [name] applying a clean dressing using gauze sponges, not tissue or cotton balls. Peri-Wound Care Topical Primary Dressing Endoform 2x2 in Discharge Instruction: Moisten with saline Secondary Dressing ALLEVYN Heel 4 1/2in x 5 1/2in / 10.5cm x 13.5cm Discharge Instruction: Apply over primary dressing as directed. Woven Gauze Sponge, Non-Sterile 4x4 in Discharge Instruction: Apply over primary dressing as directed. Secured With Conforming Stretch Gauze Bandage, Sterile 2x75 (in/in) Discharge Instruction: Secure with stretch gauze as directed. 58M Medipore H Soft Cloth Surgical T ape, 4 x 10 (in/yd) Discharge Instruction: Secure with tape as directed. Compression Wrap Compression Stockings Add-Ons Electronic Signature(s) Signed: 06/29/2022 5:22:02 PM By: [name] Entered By: [name] on 06/29/2022 10:36:00 [name] ([id number].pdf Page 7 of 8 -------------------------------------------------------------------------------- Wound Assessment Details Patient Name: Date of Service: [name] [place] E. 06/29/2022 10:15 A M Medical Record Number: [id number] Patient Account Number:  [id number] Date of Birth/Sex: Treating RN: [birth year] (46 y.o. [name] Primary Care Taygen Newsome: [name] Other Clinician: Referring Aldrin Engelhard: Treating Terrick Allred/Extender: [name] in Treatment: 102 Wound  Status Wound Number: 2 Primary Etiology: Pressure Ulcer Wound Location: Left Calcaneus Wound Status: Open Wounding Event: Pressure Injury Comorbid History: Asthma, Angina, Hypertension Date Acquired: 10/07/2019 Weeks Of Treatment: 102 Clustered Wound: No Photos Photo Uploaded By: Valeria Batman on 06/29/2022 13:54:59 Wound Measurements Length: (cm) 2 Width: (cm) 0.5 Depth: (cm) 0.2 Area: (cm) 0. Volume: (cm) 0. % Reduction in Area: 97.1% % Reduction in Volume: 99.4% Epithelialization: Medium (34-66%) 785 Tunneling: No 157 Undermining: No Wound Description Classification: Category/Stage III Wound Margin: Thickened Exudate Amount: Medium Exudate Type: Serosanguineous Exudate Color: red, brown Foul Odor After Cleansing: No Slough/Fibrino Yes Wound Bed Granulation Amount: Large (67-100%) Exposed Structure Granulation Quality: Red, Pink Fascia Exposed: No Necrotic Amount: Small (1-33%) Fat Layer (Subcutaneous Tissue) Exposed: Yes Necrotic Quality: Eschar, Adherent Slough Tendon Exposed: No Muscle Exposed: No Joint Exposed: No Bone Exposed: No Periwound Skin Texture Texture Color No Abnormalities Noted: No No Abnormalities Noted: Yes Callus: Yes Temperature / Pain Scarring: Yes Temperature: No Abnormality Moisture No Abnormalities Noted: No Maceration: Yes Treatment Notes Wound #2 (Calcaneus) Wound Laterality: Left [name], [name] ([id number].pdf Page 8 of 8 Cleanser Normal Saline Discharge Instruction: Cleanse the wound with Normal Saline prior [name] applying a clean dressing using gauze sponges, not tissue or cotton balls. Wound Cleanser Discharge Instruction: Cleanse the wound with wound  cleanser prior [name] applying a clean dressing using gauze sponges, not tissue or cotton balls. Peri-Wound Care Topical keystone Discharge Instruction: use on Left Calcaneus Primary Dressing Endoform 2x2 in Discharge Instruction: Moisten with saline. Put on after appt. with [facility]11/9/23 Secondary Dressing Optifoam Non-Adhesive Dressing, 4x4 in Discharge Instruction: Apply over primary dressing as directed. Woven Gauze Sponge, Non-Sterile 4x4 in Discharge Instruction: Apply over primary dressing as directed. Secured With 45M Medipore H Soft Cloth Surgical T ape, 4 x 10 (in/yd) Discharge Instruction: Secure with tape as directed. Compression Wrap Compression Stockings Add-Ons Electronic Signature(s) Signed: 06/29/2022 5:22:02 PM By: [name] Entered By: [name] on 06/29/2022 10:33:48 -------------------------------------------------------------------------------- Vitals Details Patient Name: Date of Service: [name], [place] 06/29/2022 10:15 A M Medical Record Number: [id number] Patient Account Number: [id number] Date of Birth/Sex: Treating RN: 04-04-1976 (46 y.o. [name] Primary Care Tarance Balan: [name] Other Clinician: Referring Matthieu Loftus: Treating Patsye Sullivant/Extender: [name] in Treatment: 102 Vital Signs Time Taken: 10:28 Temperature (F): 98.4 Height (in): 69 Pulse (bpm): 78 Weight (lbs): 280 Respiratory Rate (breaths/min): 16 Body Mass Index (BMI): 41.3 Blood Pressure (mmHg): 135/75 Reference Range: 80 - 120 mg / dl Electronic Signature(s) Signed: 06/29/2022 5:22:02 PM By: [name] Entered By: [name] on 06/29/2022 10:40:31

## 2022-06-30 NOTE — Progress Notes (Signed)
[name], [name] ([id number]) [id number] Page 1 of 16 Visit Report for 06/29/2022 Chief Complaint Document Details Patient Name: Date of Service: [name], [place] 06/29/2022 10:15 A M Medical Record Number: [id number] Patient Account Number: [id number] Date of Birth/Sex: Treating RN: 1976/06/18 (46 y.o. M) Primary Care Provider: [name] Other Clinician: Referring Provider: Treating Provider/Extender: [name] in Treatment: 102 Information Obtained from: Patient Chief Complaint Bilateral Plantar Foot Ulcers Electronic Signature(s) Signed: 06/29/2022 11:19:23 AM By: Fredirick Maudlin MD FACS Entered By: Fredirick Maudlin on 06/29/2022 11:19:23 -------------------------------------------------------------------------------- Debridement Details Patient Name: Date of Service: [name], [place] 06/29/2022 10:15 A M Medical Record Number: [id number] Patient Account Number: [id number] Date of Birth/Sex: Treating RN: [birth year] (46 y.o. [name] Primary Care Provider: [name] Other Clinician: Referring Provider: Treating Provider/Extender: [name] in Treatment: 102 Debridement Performed for Assessment: Wound #2 Left Calcaneus Performed By: Physician Fredirick Maudlin, MD Debridement Type: Debridement Level of Consciousness (Pre-procedure): Awake and Alert Pre-procedure Verification/Time Out Yes - 10:48 Taken: Start Time: 10:48 Pain Control: Lidocaine 5% topical ointment T Area Debrided (L x W): otal 2 (cm) x 0.5 (cm) = 1 (cm) Tissue and other material debrided: Non-Viable, Eschar, Slough, Slough Level: Non-Viable Tissue Debridement Description: Selective/Open Wound Instrument: Curette Bleeding: Minimum Hemostasis Achieved: Pressure End Time: 10:49 Procedural Pain: 0 Post Procedural Pain: 0 Response to Treatment: Procedure was tolerated well Level of Consciousness (Post- Awake and  Alert procedure): Post Debridement Measurements of Total Wound Length: (cm) 2 Stage: Category/Stage III Width: (cm) 0.5 Depth: (cm) 0.2 Volume: (cm) 0.157 Character of Wound/Ulcer Post Debridement: Improved Post Procedure Diagnosis [name], [name] ([id number]) [id number] Page 2 of 16 Same as Pre-procedure Notes Scribed for Dr. [name] by J.Scotton Electronic Signature(s) Signed: 06/29/2022 12:12:36 PM By: Fredirick Maudlin MD FACS Signed: 06/29/2022 5:22:02 PM By: [name] Entered By: [name] on 06/29/2022 10:52:38 -------------------------------------------------------------------------------- Debridement Details Patient Name: Date of Service: [name], [place] 06/29/2022 10:15 A M Medical Record Number: [id number] Patient Account Number: [id number] Date of Birth/Sex: Treating RN: [birth year] (46 y.o. [name] Primary Care Provider: [name] Other Clinician: Referring Provider: Treating Provider/Extender: [name] in Treatment: 102 Debridement Performed for Assessment: Wound #1 Right Calcaneus Performed By: Physician Fredirick Maudlin, MD Debridement Type: Debridement Level of Consciousness (Pre-procedure): Awake and Alert Pre-procedure Verification/Time Out Yes - 10:48 Taken: Start Time: 10:48 Pain Control: Lidocaine 5% topical ointment T Area Debrided (L x W): otal 0.3 (cm) x 0.2 (cm) = 0.06 (cm) Tissue and other material debrided: Non-Viable, Eschar, Slough, Slough Level: Non-Viable Tissue Debridement Description: Selective/Open Wound Instrument: Curette Bleeding: Minimum Hemostasis Achieved: Pressure End Time: 10:49 Procedural Pain: 0 Post Procedural Pain: 0 Response to Treatment: Procedure was tolerated well Level of Consciousness (Post- Awake and Alert procedure): Post Debridement Measurements of Total Wound Length: (cm) 0.3 Stage: Category/Stage III Width: (cm) 0.2 Depth:  (cm) 0.2 Volume: (cm) 0.009 Character of Wound/Ulcer Post Debridement: Improved Post Procedure Diagnosis Same as Pre-procedure Notes Scribed for Dr. [name] by J.Scotton Electronic Signature(s) Signed: 06/29/2022 12:12:36 PM By: Fredirick Maudlin MD FACS Signed: 06/29/2022 5:22:02 PM By: [name] Entered By: [name] on 06/29/2022 10:53:48 [name] ([id number]) [id number] Page 3 of 16 -------------------------------------------------------------------------------- HPI Details Patient Name: Date of Service: [name] [place] E. 06/29/2022 10:15 A M Medical Record Number: [id number] Patient Account Number: [id number] Date of Birth/Sex: Treating RN: [birth year] (46 y.o. M) Primary Care Provider:  [name] Other Clinician: Referring Provider: Treating Provider/Extender: [name] in Treatment: 102 History of Present Illness HPI Description: Wounds are12/03/2020 upon evaluation today patient presents for initial inspection here in our clinic concerning issues he has been having with the bottoms of his feet bilaterally. He states these actually occurred as wounds when he was hospitalized for 5 months secondary to Covid. He was apparently with tilting bed where he was in an upright position quite frequently and apparently this occurred in some way shape or form during that time. Fortunately there is no sign of active infection at this time. No fevers, chills, nausea, vomiting, or diarrhea. With that being said he still has substantial wounds on the plantar aspects of his feet Theragen require quite a bit of work to get these to heal. He has been using Santyl currently though that is been problematic both in receiving the medication as well as actually paid for it as it is become quite expensive. Prior to the experience with Covid the patient really did not have any major medical problems other than hypertension he does  have some mild generalized weakness following the Covid experience. 07/22/2020 on evaluation today patient appears to be doing okay in regard to his foot ulcers I feel like the wound beds are showing signs of better improvement that I do believe the Iodoflex is helping in this regard. With that being said he does have a lot of drainage currently and this is somewhat blue/green in nature which is consistent with Pseudomonas. I do think a culture today would be appropriate for [place] to evaluate and see if that is indeed the case I would likely start him on antibiotic orally as well he is not allergic to Cipro knows of no issues he has had in the past 12/21; patient was admitted to the clinic earlier this month with bilateral presumed pressure ulcers on the bottom of his feet apparently related to excessive pressure from a tilt table arrangement in the intensive care unit. Patient relates this to being on ECMO but I am not really sure that is exactly related to that. I must say I have never seen anything like this. He has fairly extensive full-thickness wounds extending from his heel towards his midfoot mostly centered laterally. There is already been some healing distally. He does not appear to have an arterial issue. He has been using gentamicin to the wound surfaces with Iodoflex to help with ongoing debridement 1/6; this is a patient with pressure ulcers on the bottom of his feet related to excessive pressure from a standing position in the intensive care unit. He is complaining of a lot of pain in the right heel. He is not a diabetic. 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 We have been using Iodoflex to help prepare the surfaces of both wounds for an advanced treatment product. He is nonambulatory spending most of his time in a wheelchair I have asked him not to propel the wheelchair with his heels 1/13; in general his wounds look better not much surface area change we have  been using Iodoflex as of last week. I did an x-ray of the right heel as the patient was complaining of pain. I had some thoughts about a stress fracture perhaps Achilles tendon problems however what it showed was erosive changes along the inferior aspect of the calcaneus he now has a MRI booked for 1/20. 1/20; in general his wounds continue to be better. Some improvement  in the large narrow areas proximally in his foot. He is still complaining of pain in the right heel and tenderness in certain areas of this wound. His MRI is tonight. I am not just looking for osteomyelitis that was brought up on the x-ray I am wondering about stress fractures, tendon ruptures etc. He has no such findings on the left. Also noteworthy is that the patient had critical illness neuropathy and some of the discomfort may be actual improvement in nerve function I am just not sure. These wounds were initially in the setting of severe critical illness related to COVID-19. He was put in a standing position. He may have also been on pressors at the point contributing to tissue ischemia. By his description at some point these wounds were grossly necrotic extending proximally up into the Achilles part of his heel. I do not know that I have ever really seen pictures of them like this although they may exist in epic We have ordered Tri layer Oasis. I am trying to stimulate some granulation in these areas. This is of course assuming the MRI is negative for infection 1/27; since the patient was last here he saw Dr. Juleen China of infectious disease. He is planned for vancomycin and ceftriaxone. Prior operative culture grew MSSA. Also ordered baseline lab work. He also ordered arterial studies although the ABIs in our clinic were normal as well as his clinical exam these were normal I do not think he needs to see vascular surgery. His ABIs at the [facility] were 1.22 in the right triphasic waveforms with a normal TBI of 1.15 on the left ABI of  1.22 with triphasic waveforms and a normal TBI of 1.08. Finally he saw Dr. Amalia Hailey who will follow him in for 2 months. At this point I do not think he felt that he needed a procedure on the right calcaneal bone. Dr. Juleen China is elected for broad-spectrum antibiotic The patient is still having pain in the right heel. He walks with a walker 2/3; wounds are generally smaller. He is tolerating his IV antibiotics. I believe this is vancomycin and ceftriaxone. We are still waiting for Oasis burn in terms of his out-of-pocket max which he should be meeting soon given the IV antibiotics, MRIs etc. I have asked him to check in on this. We are using silver collagen in the meantime the wounds look better 2/10; tolerating IV vancomycin and Rocephin. We are waiting to apply for Oasis. Although I am not really sure where he is in his out-of-pocket max. 2/17 started the first application of Oasis trilayer. Still on antibiotics. The wounds have generally look better. The area on the left has a little more surface slough requiring debridement 8/03; second application of Oasis trilayer. The wound surface granulation is generally look better. The area on the left with undermining laterally I think is come in a bit. 10/08/2020 upon evaluation today patient is here today for [employer] application #3. Fortunately he seems to be doing extremely well with regard to this and we are seeing a lot of new epithelial growth which is great news. Fortunately there is no signs of active infection at this time. 10/16/2020 upon evaluation today patient appears to be doing well with regard to his foot ulcers. Do believe the Oasis has been of benefit for him. I do not see any signs of infection right now which is great news and I think that he has a lot of new epithelial growth which is great to see  as well. The patient is very pleased to hear all of this. I do think we can proceed with the Oasis trilayer #4 today. 3/18; not as much  improvement in these areas on his heels that I was hoping. I did reapply trilateral Oasis today the tissue looks healthier but not as much fill in as I was hoping. 3/25; better looking today I think this is come in a bit the tissue looks healthier. Triple layer Oasis reapplied #6 4/1; somewhat better looking definitely better looking surface not as much change in surface area as I was hoping. He may be spending more time [place] on days then he needs to although he does have heel offloading boots. Triple layer Oasis reapplied #7 4/7; unfortunately apparently [facility] will not approve any further Oasis which is unfortunate since the patient did respond nicely both in terms of the condition of the wound bed as well as surface area. There is still some drainage coming from the wound but not a lot there does not appear to be any infection 4/15; we have been using Hydrofera Blue. He continues to have nice rims of epithelialization on the right greater than the left. The left the epithelialization is coming from the tip of his heel. There is moderate drainage. In this that concerns me about a total contact cast. There is no evidence of infection 4/29; patient has been using Hydrofera Blue with dressing changes. He has no complaints or issues today. 5/5; using Hydrofera Blue. I actually think that he looks marginally better than the last time I saw this 3 weeks ago. There are rims of epithelialization on the left thumb coming from the medial side on the right. Using [address] ELEK, HOLDERNESS ([id number]) [id number] Page 4 of 16 5/12; using Hydrofera Blue. These continue to make improvements in surface area. His drainage was not listed as severe I therefore went ahead and put a cast on the left foot. Right foot we will continue to dress his previous 5/16; back for first total contact cast change. He did not tolerate this particularly well cast injury on the  anterior tibia among other issues. Difficulty sleeping. I talked him about this in some detail and afterwards is elected to continue. I told him I would like to have a cast on for 3 weeks to see if this is going to help at all. I think he agreed 5/19; I think the wound is better. There is no tunneling towards his midfoot. The undermining medially also looks better. He has a rim of new skin distally. I think we are making progress here. The area on the left also continues to look somewhat better to me using Hydrofera Blue. He has a list of complaints about the cast but none of them seem serious 5/26; patient presents for 1 week follow-up. He has been using a total contact cast and tolerating this well. Hydrofera Blue is the main dressing used. He denies signs of infection. 6/2 Hydrofera Blue total contact cast on the left. These were large ulcers that formed in intensive care unit where the patient was recovering from [place]. May have had something to do with being ventilated in an upright positiono Pressors etc. We have been able to get the areas down considerably and a viable surface. There is some epithelialization in both sides. Note made of drainage 6/9; changed to [facility] last time because of drainage. He arrives with better looking surfaces and dimensions on the left than the  right. Paradoxically the right actually probes more towards his midfoot the left is largely close down but both of these look improved. Using a total contact cast on the left 6/16; complex wounds on his bilateral plantar heels which were initially pressure injury from a stay in the ICU with COVID. We have been using silver alginate most recently. His dimensions of come in quite dramatically however not recently. We have been putting the left foot in a total contact cast 6/23; complex wounds on the bilateral plantar heels. I been putting the left in the cast paradoxically the area on the right is the one that is going  towards closure at a faster rate. Quite a bit of drainage on the left. The patient went to see Dr. Amalia Hailey who said he was going to standby for skin grafts. I had actually considered sending him for skin grafts however he would be mandatorily off his feet for a period of weeks to months. I am thinking that the area on the right is going to close on its own the area on the left has been more stubborn even though we have him in a total contact cast 6/30; took him out of a total contact cast last week is the right heel seem to be making better progress than the left where I was placing the cast. We are using silver alginate. Both wounds are smaller right greater than left 7/12; both wounds look as though they are making some progress. We are using silver alginate. Heel offloading boots 7/26; very gradual progress especially on the right. Using silver alginate. He is wearing heel offloading boots 8/18; he continues to close these wounds down very gradually. Using silver alginate. The problem polymen being definitive about this is areas of what appears to be callus around the margins. This is not a 100% of the area but certainly sizable especially on the right 9/1; bilateral plantar feet wounds secondary to prolonged pressure while being ventilated for COVID-19 in an upright position. Essentially pressure ulcers on the bottom of his feet. He is made substantial progress using silver alginate. 9/14; bilateral plantar feet wounds secondary to prolonged pressure. Making progress using silver alginate. 9/29 bilateral plantar feet wounds secondary to prolonged pressure. I changed him to Iodoflex last week. MolecuLight showing reddened blush fluorescence 10/11; patient presents for follow-up. He has no issues or complaints today. He denies signs of infection. He continues to use Iodoflex and antibiotic ointment to the wound beds. 10/27; 2-week follow-up. No evidence of infection. He has callus and thick dry skin  around the wound margins we have been using Iodoflex and Bactroban which was in response to a moderate left MolecuLight reddish blush fluorescence. 11/10; 2-week follow-up. Wound margins again have thick callus however the measurements of the actual wound sites are a lot smaller. Everything looks reasonably healthy here. We have been using Iodoflex He was approved for prime matrix but I have elected to delay this given the improvement in the surface area. Hopefully I will not regret that decision as were getting close to the end of the year in terms of insurance payment 12/8; 2-week follow-up. Wounds are generally smaller in size. These were initially substantial wounds extending into the forefoot all the way into the heel on the bilateral plantar feet. They are now both located on the plantar heel distal aspect both of these have a lot of callus around the wounds I used a #5 curette to remove this on the right and the left  also some subcutaneous debris to try and get the wound edges were using Iodoflex. He has heel offloading shoe 12/22; 2-week follow-up. Not really much improvement. He has thick callus around the outer edges of both wounds. I remove this there is some nonviable subcutaneous tissue as well. We have been using Iodoflex. Her intake nurse and myself spontaneously thought of a total contact cast I went back in May. At that time we really were not seeing much of an improvement with a cast although the wound was in a much different situation I would like to retry this in 2 weeks and I discussed this with the patient 08/12/2021; the patient has had some improvement with the Iodoflex. The the area on the left heel plantar more improved than the right. I had to put him in a total contact cast on the left although I decided to put that off for 2 weeks. I am going to change his primary dressing to silver collagen. I think in both areas he has had some improvement most of the healing seems to be  more proximal in the heel. The wounds are in the mid aspect. A lot of thick callus on the right heel however. 1/19; we are using silver collagen on both plantar heel areas. He has had some improvement today. The left did not require any debridement. He still had some eschar on the right that was debrided but both seem to have contracted. I did not put it total contact cast on him today 2/2 we have been using silver collagen. The area on the right plantar heel has areas that appear to be epithelialized interspersed with dry flaking callus and dry skin. I removed this. This really looks better than on the other side. On the left still a large area with raised edges and debris on the surface. 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 He has no issues or complaints today. 2/9; not much change in the left foot wound with 1 week of a cast we are using silver collagen. Silver collagen on the right side. The right side has been the better wound surface. We will reapply the total contact cast on the left 2/16; not much improvement on either side I been using silver collagen with a total contact cast on the left. I'm changing the Hydrofera Blue still with a total contact cast on the left 2/23; some improvement on both sides. Disappointing that he has thick callus around the area that we are putting in a total contact cast on the left. We've been using Hydrofera Blue on both wound areas. This is a man who at essentially pressure ulcers in addition to ischemia caused by medications to support his blood pressure (pressors) in the ICU. He was being ventilated in the standing position for severe Covid. A Shiley the wounds extended across his entire foot but are now localized to his plantar heels bilaterally. We have made progress however neither areas healed. I continue to think the  total contact cast is helped albeit painstakingly slowly. He has never wanted a plastic surgery consult although I don't know that they would be interested in grafting in area in this location. 10/07/2021: Continued improvement bilaterally. There is still some callus around the left wound, despite the total contact cast. He has some increased pain in his right midfoot around 1 particular area. This  has been painful in the past but seems to be a little bit worse. When his cast was removed today, there was an area on the heel of the left foot that looks a bit macerated. He is also complaining of pain in his left thigh and hip which he thinks is secondary to the limb length discrepancy caused by the cast. 10/14/2021: He continues to improve. A little bit less callus around the left wound. He continues to endorse pain in his right midfoot, but this is not as significant as it was last week. The maceration on his left heel is improved. 10/21/2021: Continued improvement to both wounds. The maceration on his left heel is no longer evident. Less callus bilaterally. Epithelialization progressing. 10/28/2021: Significant improvement this week. The right sided wound is nearly closed with just a small open area at the middle. No maceration seen on the left heel. Continued epithelialization on both sides. No concern for infection. 11/04/2021: T oday, the wounds were measured a little bit differently and come out as larger, but I actually think they are about the same to potentially even smaller, particularly on the left. He continues to accumulate some callus on the right. [name], [name] ([id number]) [id number] Page 5 of 16 11/11/2021: T oday, the patient is expressing some concern that the left wound, despite being in the total contact cast, is not progressing at the same rate as the 1 on the right. He is interested in trying a week without the cast to see how the wound does. The wounds are  roughly the same size as last week, with the right perhaps being a little bit smaller. He continues to build up callus on both sites. 11/18/2021: Last week, I permitted the patient to go without his total contact cast, just to see if the cast was really making any difference. Today, both wounds have deteriorated to some extent, suggesting that the cast is providing benefit, at least on the left. Both are larger and have accumulated callus, slough, and other debris. 11/26/2021: I debrided both wounds quite aggressively last week in an effort to stimulate the healing cascade. This appears to have been effective as the left sided wound is a full centimeter shorter in length. Although the right was measured slightly larger, on inspection, it looks as though an area of epithelialized tissue was included in the measurements. We have been using PolyMem Ag on the wound surfaces with a total contact cast to the left. 12/02/2021: It appears that the intake personnel are including epithelialized tissue in his wound measurements; the right wound is almost completely epithelialized; there is just a crater at the proximal midfoot with some open areas. On the left, he has built up some callus, but the overall wound surface looks good. There is some senescent skin around the wound margin. He has been in PolyMem Ag bilaterally with a total contact cast on the left. 12/09/2021: The right wound is nearly closed; there is just a small open area at the mid calcaneus. On the left, the wound is smaller with minimal callus buildup. No significant drainage. 12/16/2021: The right calcaneal wound remains minimally open at the mid calcaneus; the rest has epithelialized. On the left, the wound is also a little bit smaller. There is some senescent tissue on the periphery. He is getting his first application of a trial skin substitute called Vendaje today. 12/23/2021: The wound on his right calcaneus is nearly closed; there is just a  small area at the most distal  aspect of the calcaneus that is open. On the left, the area where we applied to the skin substitute has a healthier-looking bed of granulation tissue. The wound dimensions are not significantly different on this side but the wound surface is improved. 12/30/2021: The wound on the right calcaneus has not changed significantly aside from some accumulation of callus. On the left, the open area is smaller and continues to have an improved surface. He continues to accumulate callus around the wound. He is here for his third application of Vendaje. 01/06/2022: The right calcaneal wound is down to just a couple of millimeters. He continues to accumulate periwound callus. He unfortunately got his cast wet earlier in the week and his left foot is macerated, resulting in some superficial skin loss just distal to the open wound. The open wound itself, however, is much smaller and has a healthier appearing surface. He is here for his fourth application of Vendaje. 01/13/2022: The right calcaneal wound is about the same. Unfortunately, once again, his cast got wet and his foot is again macerated. This is caused the left calcaneal wound to enlarge. He is here for his fifth application of Vendaje. 01/20/2022: The right calcaneal wound is very small. There is some periwound callus accumulation. He purchased a new cast protector last week and this has been effective in avoiding the maceration that has been occurring on the left. The left calcaneal wound is narrower and has a healthy and viable-appearing surface. He is here for his 6 application of Vendaje. 01/27/2022: The right calcaneal wound is down to just a pinhole. There is some periwound slough and callus. On the left, the wound is narrower and shorter by about a centimeter. The surface is robust and viable-appearing. Unfortunately, the rep for the trial skin substitute product did not provide any for [place] to use today. 02/04/2022: The right  calcaneal wound remains unchanged. There is more accumulated callus. On the left, although the intake nurse measured it a little bit longer, it looks about the same to me. The surface has a layer of slough, but underneath this, there is good granulation tissue. 02/10/2022: The right calcaneus wound is nearly closed. There is still some callus that builds up around the site. The left side looks about the same in terms of dimensions, but the surface is more robust and vital-appearing. 02/16/2022: The area of the right calcaneus that was nearly closed last week has closed, but there is a small opening at the mid foot where it looks like some moisture got retained and caused some reopening. The left foot wound is narrower and shallower. Both sites have a fair amount of periwound callus and eschar. 02/24/2022: The small midfoot opening on the right calcaneus is a little bit smaller today. The left foot wound is narrower and shallower. He continues to accumulate periwound callus. No concern for infection. 03/01/2022: The patient came to clinic early because he showered and got his cast wet. Fortunately, there is no significant maceration to his foot but the callus softened and it looks like the wound on his left calcaneus may be a little bit wider. The wound on his right calcaneus is just a narrow slit. Continued accumulation of periwound callus bilaterally. 03/08/2022: The wound on his right calcaneus is very nearly closed, just a small pinpoint opening under a bit of eschar; the left wound has come in quite a bit, as well. It is narrower and shorter than at our last visit. Still with accumulated callus and eschar  bilaterally. 03/17/2022: The right calcaneal wound is healed. The left wound is smaller and the surface itself is very clean, but there is some blue-green staining on the periwound callus, concerning for Pseudomonas aeruginosa. 03/23/2022: The right calcaneal wound remains closed. The left wound continues  to contract. No further blue-green staining. Small amount of callus and slough accumulation. 03/28/2022: He came in early today because he had gotten his cast wet. On inspection, the wound itself did not get wet or macerated, just a little bit of the forefoot. The wound itself is basically unchanged. 04/07/2022: The right foot wound remains closed. The left wound is the smallest that I have seen it to date. It is narrower and shorter. It still continues to accumulate slough on the surface. 04/15/2022: There is a band of epithelium now dividing the small left plantar foot wound in 2. There is still some slough on the surface. 04/21/2022: The wound continues to narrow. Just a little bit of slough on the surface. He seems to be responding well to endoform. 04/28/2022: Continued slow contraction of the wound. There is a little slough on the surface and some periwound callus. We have been using endoform and total contact cast. 05/05/2022: The wound appears to have stalled. There is slough and some periwound eschar/callus. No concern for infection, however. 05/12/2022: Unfortunately, his right foot has reopened. It is located at the most posterior aspect of his surgical incision. The area was noted to have drainage coming from it when his padding was removed today. Underneath some callus and senescent skin, there is an opening. No purulent drainage or malodor. On the left foot, the wound is again unchanged. There is some light blue staining on the callus, but no malodor or purulent drainage. 10/13; right and left heel remanence of extensive plantar foot wounds. These are better than I remember by quite a big margin however he is still left with wounds on the left plantar heel and the right plantar heel. Been using endoform bilaterally. A culture was done that showed apparently Pseudomonas but we are still waiting for the [facility] antibiotic to use gentamicin today. He is still very active by description I am not  sure about the offloading of his noncasted right [name], ODRISCOLL ([id number]) [id number] Page 6 of 16 foot 10/20; both wounds right and left heel debrided not much change from last week. [employer] has arrived which is linezolid, gentamicin and ciprofloxacin we will use this with endoform. T contact cast on the left otal 06/02/2022: Both wounds are smaller today. There is still a fair amount of callus buildup around the right foot ulcer. The left is more superficial and nearly flush with the surrounding tissues. Also with slough and eschar buildup. 06/10/2022: The right sided wound appears to be nearly closed, if not completely so, although it is somewhat difficult to tell given the abnormal tissue and scarring in his foot. There is a fair amount of callus and crust accumulation. On the left, the wound looks about the same, again with callus and slough. He has an appointment next Thursday with Dr. Loel Lofty in podiatry; I am hopeful that there may be some reconstructive options available for Mr. Gracie. 06/16/2022: Both wounds have some eschar and callus accumulation. The right sided wound is extremely narrow and barely open; the left is narrower than last week. There is a little bit of slough. He has his appointment in podiatry later today. 06/23/2022: The patient met with Dr. Loel Lofty last week and unfortunately, there are no  reconstructive options that he believes would be helpful. He did order an MRI to evaluate for osteomyelitis and fortunately, none was seen. The left sided wound is a little bit shorter and narrower today. The right sided wound is about the same. There is callus and eschar accumulation bilaterally. 06/29/2022: Both feet have improved from last week. There is epithelium making a valiant effort to creep across the surface on the left. The right side looks like it got a little dry and the deep crevasse in his midfoot has cracked. Both have eschar and  there is some slough on the left. Electronic Signature(s) Signed: 06/29/2022 11:20:22 AM By: Fredirick Maudlin MD FACS Entered By: Fredirick Maudlin on 06/29/2022 11:20:22 -------------------------------------------------------------------------------- Physical Exam Details Patient Name: Date of Service: [name], [place] 06/29/2022 10:15 A M Medical Record Number: [id number] Patient Account Number: [id number] Date of Birth/Sex: Treating RN: Mar 06, 1976 (46 y.o. M) Primary Care Provider: [name] Other Clinician: Referring Provider: Treating Provider/Extender: [name] in Treatment: 102 Constitutional . . . . No acute distress. Respiratory Normal work of breathing on room air. Notes 06/29/2022: Both feet have improved from last week. There is epithelium making a valiant effort to creep across the surface on the left. The right side looks like it got a little dry and the deep crevasse in his midfoot has cracked. Both have eschar and there is some slough on the left. Electronic Signature(s) Signed: 06/29/2022 11:22:15 AM By: Fredirick Maudlin MD FACS Entered By: Fredirick Maudlin on 06/29/2022 11:22:15 -------------------------------------------------------------------------------- Physician Orders Details Patient Name: Date of Service: [name], [place] 06/29/2022 10:15 A M Medical Record Number: [id number] Patient Account Number: [id number] Date of Birth/Sex: Treating RN: April 28, 1976 (46 y.o. [name] Primary Care Provider: [name] Other Clinician: Referring Provider: Treating Provider/Extender: [name] in Treatment: [phone] Verbal / Phone Orders: No Diagnosis Coding ICD-10 Coding STRIDER, VALLANCE ([id number]) [id number] Page 7 of 16 Code Description [phone] Non-pressure chronic ulcer of other part of right foot with fat layer exposed L97.522 Non-pressure chronic ulcer of other part of  left foot with fat layer exposed Follow-up Appointments ppointment in 1 week. - Dr. [name] Room 3 Return A Anesthetic Wound #2 Left Calcaneus (In clinic) Topical Lidocaine 4% applied to wound bed - In clinic Bathing/ Shower/ Hygiene May shower with protection but do not get wound dressing(s) wet. - Cover with cast protector (can purchase cast protector at [facility] or [facility] (Cast on hold 06/23/22) Edema Control - Lymphedema / SCD / Other Bilateral Lower Extremities Elevate legs to the level of the heart or above for 30 minutes daily and/or when sitting, a frequency of: - throughout the day Avoid standing for long periods of time. Moisturize legs daily. - as needed Off-Loading Other: - keep pressure off of the bottom of your feet. Globoped to left foot-when not wearing the cast Elevate legs throughout the day Additional Orders / Instructions Follow Nutritious Diet Wound Treatment Wound #1 - Calcaneus Wound Laterality: Right Cleanser: Normal Saline (Generic) Every Other Day/30 Days Discharge Instructions: Cleanse the wound with Normal Saline prior to applying a clean dressing using gauze sponges, not tissue or cotton balls. Cleanser: Wound Cleanser (Generic) Every Other Day/30 Days Discharge Instructions: Cleanse the wound with wound cleanser prior to applying a clean dressing using gauze sponges, not tissue or cotton balls. Prim Dressing: Endoform 2x2 in (Generic) Every Other Day/30 Days ary Discharge Instructions: Moisten with saline Secondary Dressing: ALLEVYN Heel 4 1/2in  x 5 1/2in / 10.5cm x 13.5cm (Generic) Every Other Day/30 Days Discharge Instructions: Apply over primary dressing as directed. Secondary Dressing: Woven Gauze Sponge, Non-Sterile 4x4 in Every Other Day/30 Days Discharge Instructions: Apply over primary dressing as directed. Secured With: Child psychotherapist, Sterile 2x75 (in/in) (Generic) Every Other Day/30 Days Discharge Instructions: Secure with  stretch gauze as directed. Secured With: 35M Medipore H Soft Cloth Surgical T ape, 4 x 10 (in/yd) (Generic) Every Other Day/30 Days Discharge Instructions: Secure with tape as directed. Wound #2 - Calcaneus Wound Laterality: Left Cleanser: Normal Saline (Generic) 1 x Per Week/30 Days Discharge Instructions: Cleanse the wound with Normal Saline prior to applying a clean dressing using gauze sponges, not tissue or cotton balls. Cleanser: Wound Cleanser 1 x Per Week/30 Days Discharge Instructions: Cleanse the wound with wound cleanser prior to applying a clean dressing using gauze sponges, not tissue or cotton balls. Topical: keystone 1 x Per Week/30 Days Discharge Instructions: use on Left Calcaneus Prim Dressing: Endoform 2x2 in 1 x Per Week/30 Days ary Discharge Instructions: Moisten with saline. Put on after appt. with [facility]11/9/23 Secondary Dressing: Optifoam Non-Adhesive Dressing, 4x4 in (Generic) 1 x Per Week/30 Days Discharge Instructions: Apply over primary dressing as directed. Secondary Dressing: Woven Gauze Sponge, Non-Sterile 4x4 in 1 x Per Week/30 Days Discharge Instructions: Apply over primary dressing as directed. Secured With: 35M Medipore H Soft Cloth Surgical T ape, 4 x 10 (in/yd) (Generic) 1 x Per Week/30 Days Discharge Instructions: Secure with tape as directed. Electronic Signature(s) KARLA, VINES ([id number]) [id number] Page 8 of 16 Signed: 06/29/2022 12:12:36 PM By: Fredirick Maudlin MD FACS Entered By: Fredirick Maudlin on 06/29/2022 11:22:27 -------------------------------------------------------------------------------- Problem List Details Patient Name: Date of Service: [name], [place] 06/29/2022 10:15 A M Medical Record Number: [id number] Patient Account Number: [id number] Date of Birth/Sex: Treating RN: Feb 19, 1976 (46 y.o. M) Primary Care Provider: [name] Other Clinician: Referring Provider: Treating  Provider/Extender: [name] in Treatment: [place] Active Problems ICD-10 Encounter Code Description Active Date MDM Diagnosis L97.512 Non-pressure chronic ulcer of other part of right foot with fat layer exposed 09/03/2020 No Yes L97.522 Non-pressure chronic ulcer of other part of left foot with fat layer exposed 09/03/2020 No Yes Inactive Problems ICD-10 Code Description Active Date Inactive Date L89.893 Pressure ulcer of other site, stage 3 07/15/2020 07/15/2020 M62.81 Muscle weakness (generalized) 07/15/2020 07/15/2020 I10 Essential (primary) hypertension 07/15/2020 07/15/2020 M86.171 Other acute osteomyelitis, right ankle and foot 09/03/2020 09/03/2020 Resolved Problems Electronic Signature(s) Signed: 06/29/2022 11:18:59 AM By: Fredirick Maudlin MD FACS Entered By: Fredirick Maudlin on 06/29/2022 11:18:59 -------------------------------------------------------------------------------- Progress Note Details Patient Name: Date of Service: [name], [place] 06/29/2022 10:15 A M Medical Record Number: [id number] Patient Account Number: [id number] Date of Birth/Sex: Treating RN: August 03, 1976 (46 y.o. M) Primary Care Provider: [name] Other Clinician: Referring Provider: Treating Provider/Extender: [name] in Treatment: [address] ([id number]) [id number] Page 9 of 16 Subjective Chief Complaint Information obtained from Patient Bilateral Plantar Foot Ulcers History of Present Illness (HPI) Wounds are12/03/2020 upon evaluation today patient presents for initial inspection here in our clinic concerning issues he has been having with the bottoms of his feet bilaterally. He states these actually occurred as wounds when he was hospitalized for 5 months secondary to Covid. He was apparently with tilting bed where he was in an upright position quite frequently and apparently this occurred in some way shape or  form during  that time. Fortunately there is no sign of active infection at this time. No fevers, chills, nausea, vomiting, or diarrhea. With that being said he still has substantial wounds on the plantar aspects of his feet Theragen require quite a bit of work to get these to heal. He has been using Santyl currently though that is been problematic both in receiving the medication as well as actually paid for it as it is become quite expensive. Prior to the experience with Covid the patient really did not have any major medical problems other than hypertension he does have some mild generalized weakness following the Covid experience. 07/22/2020 on evaluation today patient appears to be doing okay in regard to his foot ulcers I feel like the wound beds are showing signs of better improvement that I do believe the Iodoflex is helping in this regard. With that being said he does have a lot of drainage currently and this is somewhat blue/green in nature which is consistent with Pseudomonas. I do think a culture today would be appropriate for [place] to evaluate and see if that is indeed the case I would likely start him on antibiotic orally as well he is not allergic to Cipro knows of no issues he has had in the past 12/21; patient was admitted to the clinic earlier this month with bilateral presumed pressure ulcers on the bottom of his feet apparently related to excessive pressure from a tilt table arrangement in the intensive care unit. Patient relates this to being on ECMO but I am not really sure that is exactly related to that. I must say I have never seen anything like this. He has fairly extensive full-thickness wounds extending from his heel towards his midfoot mostly centered laterally. There is already been some healing distally. He does not appear to have an arterial issue. He has been using gentamicin to the wound surfaces with Iodoflex to help with ongoing debridement 1/6; this is a patient with  pressure ulcers on the bottom of his feet related to excessive pressure from a standing position in the intensive care unit. He is complaining of a lot of pain in the right heel. He is not a diabetic. 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 We have been using Iodoflex to help prepare the surfaces of both wounds for an advanced treatment product. He is nonambulatory spending most of his time in a wheelchair I have asked him not to propel the wheelchair with his heels 1/13; in general his wounds look better not much surface area change we have been using Iodoflex as of last week. I did an x-ray of the right heel as the patient was complaining of pain. I had some thoughts about a stress fracture perhaps Achilles tendon problems however what it showed was erosive changes along the inferior aspect of the calcaneus he now has a MRI booked for 1/20. 1/20; in general his wounds continue to be better. Some improvement in the large narrow areas proximally in his foot. He is still complaining of pain in the right heel and tenderness in certain areas of this wound. His MRI is tonight. I am not just looking for osteomyelitis that was brought up on the x-ray I am wondering about stress fractures, tendon ruptures etc. He has no such findings on the left. Also noteworthy is that the patient had critical illness neuropathy and some of the discomfort may be actual improvement in nerve function I am just not sure. These wounds were initially  in the setting of severe critical illness related to COVID-19. He was put in a standing position. He may have also been on pressors at the point contributing to tissue ischemia. By his description at some point these wounds were grossly necrotic extending proximally up into the Achilles part of his heel. I do not know that I have ever really seen pictures of them like this although they may exist in epic We have ordered Tri layer Oasis. I am trying to  stimulate some granulation in these areas. This is of course assuming the MRI is negative for infection 1/27; since the patient was last here he saw Dr. Juleen China of infectious disease. He is planned for vancomycin and ceftriaxone. Prior operative culture grew MSSA. Also ordered baseline lab work. He also ordered arterial studies although the ABIs in our clinic were normal as well as his clinical exam these were normal I do not think he needs to see vascular surgery. His ABIs at the [facility] were 1.22 in the right triphasic waveforms with a normal TBI of 1.15 on the left ABI of 1.22 with triphasic waveforms and a normal TBI of 1.08. Finally he saw Dr. Amalia Hailey who will follow him in for 2 months. At this point I do not think he felt that he needed a procedure on the right calcaneal bone. Dr. Juleen China is elected for broad-spectrum antibiotic The patient is still having pain in the right heel. He walks with a walker 2/3; wounds are generally smaller. He is tolerating his IV antibiotics. I believe this is vancomycin and ceftriaxone. We are still waiting for Oasis burn in terms of his out-of-pocket max which he should be meeting soon given the IV antibiotics, MRIs etc. I have asked him to check in on this. We are using silver collagen in the meantime the wounds look better 2/10; tolerating IV vancomycin and Rocephin. We are waiting to apply for Oasis. Although I am not really sure where he is in his out-of-pocket max. 2/17 started the first application of Oasis trilayer. Still on antibiotics. The wounds have generally look better. The area on the left has a little more surface slough requiring debridement 6/14; second application of Oasis trilayer. The wound surface granulation is generally look better. The area on the left with undermining laterally I think is come in a bit. 10/08/2020 upon evaluation today patient is here today for [employer] application #3. Fortunately he seems to be doing extremely well with  regard to this and we are seeing a lot of new epithelial growth which is great news. Fortunately there is no signs of active infection at this time. 10/16/2020 upon evaluation today patient appears to be doing well with regard to his foot ulcers. Do believe the Oasis has been of benefit for him. I do not see any signs of infection right now which is great news and I think that he has a lot of new epithelial growth which is great to see as well. The patient is very pleased to hear all of this. I do think we can proceed with the Oasis trilayer #4 today. 3/18; not as much improvement in these areas on his heels that I was hoping. I did reapply trilateral Oasis today the tissue looks healthier but not as much fill in as I was hoping. 3/25; better looking today I think this is come in a bit the tissue looks healthier. Triple layer Oasis reapplied #6 4/1; somewhat better looking definitely better looking surface not as much change  in surface area as I was hoping. He may be spending more time [place] on days then he needs to although he does have heel offloading boots. Triple layer Oasis reapplied #7 4/7; unfortunately apparently [facility] will not approve any further Oasis which is unfortunate since the patient did respond nicely both in terms of the condition of the wound bed as well as surface area. There is still some drainage coming from the wound but not a lot there does not appear to be any infection 4/15; we have been using Hydrofera Blue. He continues to have nice rims of epithelialization on the right greater than the left. The left the epithelialization is coming from the tip of his heel. There is moderate drainage. In this that concerns me about a total contact cast. There is no evidence of infection 4/29; patient has been using Hydrofera Blue with dressing changes. He has no complaints or issues today. 5/5; using Hydrofera Blue. I actually think that he looks marginally better than  the last time I saw this 3 weeks ago. There are rims of epithelialization on the left thumb coming from the medial side on the right. Using Hydrofera Blue 5/12; using Hydrofera Blue. These continue to make improvements in surface area. His drainage was not listed as severe I therefore went ahead and put a cast on the left foot. Right foot we will continue to dress his previous 5/16; back for first total contact cast change. He did not tolerate this particularly well cast injury on the anterior tibia among other issues. Difficulty sleeping. I talked him about this in some detail and afterwards is elected to continue. I told him I would like to have a cast on for 3 weeks to see if this is going to help at all. I think he agreed 5/19; I think the wound is better. There is no tunneling towards his midfoot. The undermining medially also looks better. He has a rim of new skin distally. I think [name], [name] ([id number]) [id number] Page 10 of 16 we are making progress here. The area on the left also continues to look somewhat better to me using Hydrofera Blue. He has a list of complaints about the cast but none of them seem serious 5/26; patient presents for 1 week follow-up. He has been using a total contact cast and tolerating this well. Hydrofera Blue is the main dressing used. He denies signs of infection. 6/2 Hydrofera Blue total contact cast on the left. These were large ulcers that formed in intensive care unit where the patient was recovering from [place]. May have had something to do with being ventilated in an upright positiono Pressors etc. We have been able to get the areas down considerably and a viable surface. There is some epithelialization in both sides. Note made of drainage 6/9; changed to [facility] last time because of drainage. He arrives with better looking surfaces and dimensions on the left than the right. Paradoxically the right actually probes more  towards his midfoot the left is largely close down but both of these look improved. Using a total contact cast on the left 6/16; complex wounds on his bilateral plantar heels which were initially pressure injury from a stay in the ICU with COVID. We have been using silver alginate most recently. His dimensions of come in quite dramatically however not recently. We have been putting the left foot in a total contact cast 6/23; complex wounds on the bilateral plantar heels. I been putting  the left in the cast paradoxically the area on the right is the one that is going towards closure at a faster rate. Quite a bit of drainage on the left. The patient went to see Dr. Amalia Hailey who said he was going to standby for skin grafts. I had actually considered sending him for skin grafts however he would be mandatorily off his feet for a period of weeks to months. I am thinking that the area on the right is going to close on its own the area on the left has been more stubborn even though we have him in a total contact cast 6/30; took him out of a total contact cast last week is the right heel seem to be making better progress than the left where I was placing the cast. We are using silver alginate. Both wounds are smaller right greater than left 7/12; both wounds look as though they are making some progress. We are using silver alginate. Heel offloading boots 7/26; very gradual progress especially on the right. Using silver alginate. He is wearing heel offloading boots 8/18; he continues to close these wounds down very gradually. Using silver alginate. The problem polymen being definitive about this is areas of what appears to be callus around the margins. This is not a 100% of the area but certainly sizable especially on the right 9/1; bilateral plantar feet wounds secondary to prolonged pressure while being ventilated for COVID-19 in an upright position. Essentially pressure ulcers on the bottom of his feet. He is  made substantial progress using silver alginate. 9/14; bilateral plantar feet wounds secondary to prolonged pressure. Making progress using silver alginate. 9/29 bilateral plantar feet wounds secondary to prolonged pressure. I changed him to Iodoflex last week. MolecuLight showing reddened blush fluorescence 10/11; patient presents for follow-up. He has no issues or complaints today. He denies signs of infection. He continues to use Iodoflex and antibiotic ointment to the wound beds. 10/27; 2-week follow-up. No evidence of infection. He has callus and thick dry skin around the wound margins we have been using Iodoflex and Bactroban which was in response to a moderate left MolecuLight reddish blush fluorescence. 11/10; 2-week follow-up. Wound margins again have thick callus however the measurements of the actual wound sites are a lot smaller. Everything looks reasonably healthy here. We have been using Iodoflex He was approved for prime matrix but I have elected to delay this given the improvement in the surface area. Hopefully I will not regret that decision as were getting close to the end of the year in terms of insurance payment 12/8; 2-week follow-up. Wounds are generally smaller in size. These were initially substantial wounds extending into the forefoot all the way into the heel on the bilateral plantar feet. They are now both located on the plantar heel distal aspect both of these have a lot of callus around the wounds I used a #5 curette to remove this on the right and the left also some subcutaneous debris to try and get the wound edges were using Iodoflex. He has heel offloading shoe 12/22; 2-week follow-up. Not really much improvement. He has thick callus around the outer edges of both wounds. I remove this there is some nonviable subcutaneous tissue as well. We have been using Iodoflex. Her intake nurse and myself spontaneously thought of a total contact cast I went back in May. At that  time we really were not seeing much of an improvement with a cast although the wound was in a much  different situation I would like to retry this in 2 weeks and I discussed this with the patient 08/12/2021; the patient has had some improvement with the Iodoflex. The the area on the left heel plantar more improved than the right. I had to put him in a total contact cast on the left although I decided to put that off for 2 weeks. I am going to change his primary dressing to silver collagen. I think in both areas he has had some improvement most of the healing seems to be more proximal in the heel. The wounds are in the mid aspect. A lot of thick callus on the right heel however. 1/19; we are using silver collagen on both plantar heel areas. He has had some improvement today. The left did not require any debridement. He still had some eschar on the right that was debrided but both seem to have contracted. I did not put it total contact cast on him today 2/2 we have been using silver collagen. The area on the right plantar heel has areas that appear to be epithelialized interspersed with dry flaking callus and dry skin. I removed this. This really looks better than on the other side. On the left still a large area with raised edges and debris on the surface. 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 He has no issues or complaints today. 2/9; not much change in the left foot wound with 1 week of a cast we are using silver collagen. Silver collagen on the right side. The right side has been the better wound surface. We will reapply the total contact cast on the left 2/16; not much improvement on either side I been using silver collagen with a total contact cast on the left. I'm changing the Hydrofera Blue still with a total contact cast on the left 2/23; some improvement on  both sides. Disappointing that he has thick callus around the area that we are putting in a total contact cast on the left. We've been using Hydrofera Blue on both wound areas. This is a man who at essentially pressure ulcers in addition to ischemia caused by medications to support his blood pressure (pressors) in the ICU. He was being ventilated in the standing position for severe Covid. A Shiley the wounds extended across his entire foot but are now localized to his plantar heels bilaterally. We have made progress however neither areas healed. I continue to think the total contact cast is helped albeit painstakingly slowly. He has never wanted a plastic surgery consult although I don't know that they would be interested in grafting in area in this location. 10/07/2021: Continued improvement bilaterally. There is still some callus around the left wound, despite the total contact cast. He has some increased pain in his right midfoot around 1 particular area. This has been painful in the past but seems to be a little bit worse. When his cast was removed today, there was an area on the heel of the left foot that looks a bit macerated. He is also complaining of pain in his left thigh and hip which he thinks is secondary to the limb length discrepancy caused by the cast. 10/14/2021: He continues to improve. A little bit less callus around the left wound. He continues to endorse pain in his right midfoot, but this is not as significant as it  was last week. The maceration on his left heel is improved. 10/21/2021: Continued improvement to both wounds. The maceration on his left heel is no longer evident. Less callus bilaterally. Epithelialization progressing. 10/28/2021: Significant improvement this week. The right sided wound is nearly closed with just a small open area at the middle. No maceration seen on the left heel. Continued epithelialization on both sides. No concern for infection. 11/04/2021: T oday, the  wounds were measured a little bit differently and come out as larger, but I actually think they are about the same to potentially even smaller, particularly on the left. He continues to accumulate some callus on the right. 11/11/2021: T oday, the patient is expressing some concern that the left wound, despite being in the total contact cast, is not progressing at the same rate as the 1 on the right. He is interested in trying a week without the cast to see how the wound does. The wounds are roughly the same size as last week, with the right perhaps being a little bit smaller. He continues to build up callus on both sites. 11/18/2021: Last week, I permitted the patient to go without his total contact cast, just to see if the cast was really making any difference. Today, both wounds [name], [name] ([id number]) [id number] Page 11 of 16 have deteriorated to some extent, suggesting that the cast is providing benefit, at least on the left. Both are larger and have accumulated callus, slough, and other debris. 11/26/2021: I debrided both wounds quite aggressively last week in an effort to stimulate the healing cascade. This appears to have been effective as the left sided wound is a full centimeter shorter in length. Although the right was measured slightly larger, on inspection, it looks as though an area of epithelialized tissue was included in the measurements. We have been using PolyMem Ag on the wound surfaces with a total contact cast to the left. 12/02/2021: It appears that the intake personnel are including epithelialized tissue in his wound measurements; the right wound is almost completely epithelialized; there is just a crater at the proximal midfoot with some open areas. On the left, he has built up some callus, but the overall wound surface looks good. There is some senescent skin around the wound margin. He has been in PolyMem Ag bilaterally with a total contact cast on  the left. 12/09/2021: The right wound is nearly closed; there is just a small open area at the mid calcaneus. On the left, the wound is smaller with minimal callus buildup. No significant drainage. 12/16/2021: The right calcaneal wound remains minimally open at the mid calcaneus; the rest has epithelialized. On the left, the wound is also a little bit smaller. There is some senescent tissue on the periphery. He is getting his first application of a trial skin substitute called Vendaje today. 12/23/2021: The wound on his right calcaneus is nearly closed; there is just a small area at the most distal aspect of the calcaneus that is open. On the left, the area where we applied to the skin substitute has a healthier-looking bed of granulation tissue. The wound dimensions are not significantly different on this side but the wound surface is improved. 12/30/2021: The wound on the right calcaneus has not changed significantly aside from some accumulation of callus. On the left, the open area is smaller and continues to have an improved surface. He continues to accumulate callus around the wound. He is here for his third application of Vendaje. 01/06/2022: The  right calcaneal wound is down to just a couple of millimeters. He continues to accumulate periwound callus. He unfortunately got his cast wet earlier in the week and his left foot is macerated, resulting in some superficial skin loss just distal to the open wound. The open wound itself, however, is much smaller and has a healthier appearing surface. He is here for his fourth application of Vendaje. 01/13/2022: The right calcaneal wound is about the same. Unfortunately, once again, his cast got wet and his foot is again macerated. This is caused the left calcaneal wound to enlarge. He is here for his fifth application of Vendaje. 01/20/2022: The right calcaneal wound is very small. There is some periwound callus accumulation. He purchased a new cast protector last  week and this has been effective in avoiding the maceration that has been occurring on the left. The left calcaneal wound is narrower and has a healthy and viable-appearing surface. He is here for his 6 application of Vendaje. 01/27/2022: The right calcaneal wound is down to just a pinhole. There is some periwound slough and callus. On the left, the wound is narrower and shorter by about a centimeter. The surface is robust and viable-appearing. Unfortunately, the rep for the trial skin substitute product did not provide any for [place] to use today. 02/04/2022: The right calcaneal wound remains unchanged. There is more accumulated callus. On the left, although the intake nurse measured it a little bit longer, it looks about the same to me. The surface has a layer of slough, but underneath this, there is good granulation tissue. 02/10/2022: The right calcaneus wound is nearly closed. There is still some callus that builds up around the site. The left side looks about the same in terms of dimensions, but the surface is more robust and vital-appearing. 02/16/2022: The area of the right calcaneus that was nearly closed last week has closed, but there is a small opening at the mid foot where it looks like some moisture got retained and caused some reopening. The left foot wound is narrower and shallower. Both sites have a fair amount of periwound callus and eschar. 02/24/2022: The small midfoot opening on the right calcaneus is a little bit smaller today. The left foot wound is narrower and shallower. He continues to accumulate periwound callus. No concern for infection. 03/01/2022: The patient came to clinic early because he showered and got his cast wet. Fortunately, there is no significant maceration to his foot but the callus softened and it looks like the wound on his left calcaneus may be a little bit wider. The wound on his right calcaneus is just a narrow slit. Continued accumulation of periwound callus  bilaterally. 03/08/2022: The wound on his right calcaneus is very nearly closed, just a small pinpoint opening under a bit of eschar; the left wound has come in quite a bit, as well. It is narrower and shorter than at our last visit. Still with accumulated callus and eschar bilaterally. 03/17/2022: The right calcaneal wound is healed. The left wound is smaller and the surface itself is very clean, but there is some blue-green staining on the periwound callus, concerning for Pseudomonas aeruginosa. 03/23/2022: The right calcaneal wound remains closed. The left wound continues to contract. No further blue-green staining. Small amount of callus and slough accumulation. 03/28/2022: He came in early today because he had gotten his cast wet. On inspection, the wound itself did not get wet or macerated, just a little bit of the forefoot. The wound itself  is basically unchanged. 04/07/2022: The right foot wound remains closed. The left wound is the smallest that I have seen it to date. It is narrower and shorter. It still continues to accumulate slough on the surface. 04/15/2022: There is a band of epithelium now dividing the small left plantar foot wound in 2. There is still some slough on the surface. 04/21/2022: The wound continues to narrow. Just a little bit of slough on the surface. He seems to be responding well to endoform. 04/28/2022: Continued slow contraction of the wound. There is a little slough on the surface and some periwound callus. We have been using endoform and total contact cast. 05/05/2022: The wound appears to have stalled. There is slough and some periwound eschar/callus. No concern for infection, however. 05/12/2022: Unfortunately, his right foot has reopened. It is located at the most posterior aspect of his surgical incision. The area was noted to have drainage coming from it when his padding was removed today. Underneath some callus and senescent skin, there is an opening. No purulent  drainage or malodor. On the left foot, the wound is again unchanged. There is some light blue staining on the callus, but no malodor or purulent drainage. 10/13; right and left heel remanence of extensive plantar foot wounds. These are better than I remember by quite a big margin however he is still left with wounds on the left plantar heel and the right plantar heel. Been using endoform bilaterally. A culture was done that showed apparently Pseudomonas but we are still waiting for the [name] antibiotic to use gentamicin today. He is still very active by description I am not sure about the offloading of his noncasted right foot 10/20; both wounds right and left heel debrided not much change from last week. [employer] has arrived which is linezolid, gentamicin and ciprofloxacin we will use this with endoform. T contact cast on the left otal 06/02/2022: Both wounds are smaller today. There is still a fair amount of callus buildup around the right foot ulcer. The left is more superficial and nearly flush with the surrounding tissues. Also with slough and eschar buildup. [name], [name] ([id number]) [id number] Page 12 of 16 06/10/2022: The right sided wound appears to be nearly closed, if not completely so, although it is somewhat difficult to tell given the abnormal tissue and scarring in his foot. There is a fair amount of callus and crust accumulation. On the left, the wound looks about the same, again with callus and slough. He has an appointment next Thursday with Dr. Loel Lofty in podiatry; I am hopeful that there may be some reconstructive options available for Mr. Badolato. 06/16/2022: Both wounds have some eschar and callus accumulation. The right sided wound is extremely narrow and barely open; the left is narrower than last week. There is a little bit of slough. He has his appointment in podiatry later today. 06/23/2022: The patient met with Dr. Loel Lofty last week  and unfortunately, there are no reconstructive options that he believes would be helpful. He did order an MRI to evaluate for osteomyelitis and fortunately, none was seen. The left sided wound is a little bit shorter and narrower today. The right sided wound is about the same. There is callus and eschar accumulation bilaterally. 06/29/2022: Both feet have improved from last week. There is epithelium making a valiant effort to creep across the surface on the left. The right side looks like it got a little dry and the deep crevasse in his midfoot has cracked. Both  have eschar and there is some slough on the left. Patient History Information obtained from Patient. Family History Cancer - Maternal Grandparents, Diabetes - Father,Paternal Grandparents, Heart Disease - Maternal Grandparents, Hypertension - Father,Paternal Grandparents, Lung Disease - Siblings, No family history of Hereditary Spherocytosis, Kidney Disease, Seizures, Stroke, Thyroid Problems, Tuberculosis. Social History Never smoker, Marital Status - Married, Alcohol Use - Never, Drug Use - No History, Caffeine Use - Daily - tea, soda. Medical History Eyes Denies history of Cataracts, Glaucoma, Optic Neuritis Ear/Nose/Mouth/Throat Denies history of Chronic sinus problems/congestion, Middle ear problems Hematologic/Lymphatic Denies history of Anemia, Hemophilia, Human Immunodeficiency Virus, Lymphedema, Sickle Cell Disease Respiratory Patient has history of Asthma Denies history of Aspiration, Chronic Obstructive Pulmonary Disease (COPD), Pneumothorax, Sleep Apnea, Tuberculosis Cardiovascular Patient has history of Angina - with COVID, Hypertension Denies history of Arrhythmia, Congestive Heart Failure, Coronary Artery Disease, Deep Vein Thrombosis, Hypotension, Myocardial Infarction, Peripheral Arterial Disease, Peripheral Venous Disease, Phlebitis, Vasculitis Gastrointestinal Denies history of Cirrhosis , Colitis, Crohnoos,  Hepatitis A, Hepatitis B, Hepatitis C Endocrine Denies history of Type I Diabetes, Type II Diabetes Genitourinary Denies history of End Stage Renal Disease Immunological Denies history of Lupus Erythematosus, Raynaudoos, Scleroderma Integumentary (Skin) Denies history of History of Burn Musculoskeletal Denies history of Gout, Rheumatoid Arthritis, Osteoarthritis, Osteomyelitis Neurologic Denies history of Dementia, Neuropathy, Quadriplegia, Paraplegia, Seizure Disorder Oncologic Denies history of Received Chemotherapy, Received Radiation Psychiatric Denies history of Anorexia/bulimia, Confinement Anxiety Hospitalization/Surgery History - COVID PNA 07/22/2019- 11/14/2019. - 03/27/2020 wound debridement/ skin graft. Medical A Surgical History Notes nd Constitutional Symptoms (General Health) COVID PNA 07/22/2019-11/14/2019 VENT ECMO, foot drop left foot , Genitourinary kidney stone Psychiatric anxiety Objective Constitutional No acute distress. Vitals Time Taken: 10:28 AM, Height: 69 in, Weight: 280 lbs, BMI: 41.3, Temperature: 98.4 F, Pulse: 78 bpm, Respiratory Rate: 16 breaths/min, Blood Pressure: 135/75 mmHg. Respiratory Normal work of breathing on room air. [name], [name] ([id number]) [id number] Page 13 of 16 General Notes: 06/29/2022: Both feet have improved from last week. There is epithelium making a valiant effort to creep across the surface on the left. The right side looks like it got a little dry and the deep crevasse in his midfoot has cracked. Both have eschar and there is some slough on the left. Integumentary (Hair, Skin) Wound #1 status is Open. Original cause of wound was Pressure Injury. The date acquired was: 10/07/2019. The wound has been in treatment 102 weeks. The wound is located on the Right Calcaneus. The wound measures 0.3cm length x 0.2cm width x 0.2cm depth; 0.047cm^2 area and 0.009cm^3 volume. There is Fat Layer  (Subcutaneous Tissue) exposed. There is no tunneling or undermining noted. There is a medium amount of serosanguineous drainage noted. The wound margin is thickened. There is medium (34-66%) pink granulation within the wound bed. There is a medium (34-66%) amount of necrotic tissue within the wound bed including Eschar and Adherent Slough. The periwound skin appearance had no abnormalities noted for moisture. The periwound skin appearance had no abnormalities noted for color. The periwound skin appearance exhibited: Callus. Periwound temperature was noted as No Abnormality. Wound #2 status is Open. Original cause of wound was Pressure Injury. The date acquired was: 10/07/2019. The wound has been in treatment 102 weeks. The wound is located on the Left Calcaneus. The wound measures 2cm length x 0.5cm width x 0.2cm depth; 0.785cm^2 area and 0.157cm^3 volume. There is Fat Layer (Subcutaneous Tissue) exposed. There is no tunneling or undermining noted. There is a medium amount of serosanguineous drainage  noted. The wound margin is thickened. There is large (67-100%) red, pink granulation within the wound bed. There is a small (1-33%) amount of necrotic tissue within the wound bed including Eschar and Adherent Slough. The periwound skin appearance had no abnormalities noted for color. The periwound skin appearance exhibited: Callus, Scarring, Maceration. Periwound temperature was noted as No Abnormality. Assessment Active Problems ICD-10 Non-pressure chronic ulcer of other part of right foot with fat layer exposed Non-pressure chronic ulcer of other part of left foot with fat layer exposed Procedures Wound #1 Pre-procedure diagnosis of Wound #1 is a Pressure Ulcer located on the Right Calcaneus . There was a Selective/Open Wound Non-Viable Tissue Debridement with a total area of 0.06 sq cm performed by Fredirick Maudlin, MD. With the following instrument(s): Curette to remove Non-Viable tissue/material.  Material removed includes Eschar and Slough and after achieving pain control using Lidocaine 5% topical ointment. No specimens were taken. A time out was conducted at 10:48, prior to the start of the procedure. A Minimum amount of bleeding was controlled with Pressure. The procedure was tolerated well with a pain level of 0 throughout and a pain level of 0 following the procedure. Post Debridement Measurements: 0.3cm length x 0.2cm width x 0.2cm depth; 0.009cm^3 volume. Post debridement Stage noted as Category/Stage III. Character of Wound/Ulcer Post Debridement is improved. Post procedure Diagnosis Wound #1: Same as Pre-Procedure General Notes: Scribed for Dr. [name] by J.Scotton. Wound #2 Pre-procedure diagnosis of Wound #2 is a Pressure Ulcer located on the Left Calcaneus . There was a Selective/Open Wound Non-Viable Tissue Debridement with a total area of 1 sq cm performed by Fredirick Maudlin, MD. With the following instrument(s): Curette to remove Non-Viable tissue/material. Material removed includes Eschar and Slough and after achieving pain control using Lidocaine 5% topical ointment. No specimens were taken. A time out was conducted at 10:48, prior to the start of the procedure. A Minimum amount of bleeding was controlled with Pressure. The procedure was tolerated well with a pain level of 0 throughout and a pain level of 0 following the procedure. Post Debridement Measurements: 2cm length x 0.5cm width x 0.2cm depth; 0.157cm^3 volume. Post debridement Stage noted as Category/Stage III. Character of Wound/Ulcer Post Debridement is improved. Post procedure Diagnosis Wound #2: Same as Pre-Procedure General Notes: Scribed for Dr. [name] by J.Scotton. Plan Follow-up Appointments: Return Appointment in 1 week. - Dr. [name] Room 3 Anesthetic: Wound #2 Left Calcaneus: (In clinic) Topical Lidocaine 4% applied to wound bed - In clinic Bathing/ Shower/ Hygiene: May shower with protection but  do not get wound dressing(s) wet. - Cover with cast protector (can purchase cast protector at [facility] or [facility] (Cast on hold 06/23/22) Edema Control - Lymphedema / SCD / Other: Elevate legs to the level of the heart or above for 30 minutes daily and/or when sitting, a frequency of: - throughout the day Avoid standing for long periods of time. Moisturize legs daily. - as needed Off-Loading: Other: - keep pressure off of the bottom of your feet. Globoped to left foot-when not wearing the cast Elevate legs throughout the day Additional Orders / Instructions: Follow Nutritious Diet WOUND #1: - Calcaneus Wound Laterality: Right Cleanser: Normal Saline (Generic) Every Other Day/30 Days Discharge Instructions: Cleanse the wound with Normal Saline prior to applying a clean dressing using gauze sponges, not tissue or cotton balls. Cleanser: Wound Cleanser (Generic) Every Other Day/30 Days Discharge Instructions: Cleanse the wound with wound cleanser prior to applying a clean dressing using gauze  sponges, not tissue or cotton balls. Prim Dressing: Endoform 2x2 in (Generic) Every Other Day/30 Days [name], [name] ([id number]) [phone].pdf Page 14 of 16 Discharge Instructions: Moisten with saline Secondary Dressing: ALLEVYN Heel 4 1/2in x 5 1/2in / 10.5cm x 13.5cm (Generic) Every Other Day/30 Days Discharge Instructions: Apply over primary dressing as directed. Secondary Dressing: Woven Gauze Sponge, Non-Sterile 4x4 in Every Other Day/30 Days Discharge Instructions: Apply over primary dressing as directed. Secured With: Child psychotherapist, Sterile 2x75 (in/in) (Generic) Every Other Day/30 Days Discharge Instructions: Secure with stretch gauze as directed. Secured With: 66M Medipore H Soft Cloth Surgical T ape, 4 x 10 (in/yd) (Generic) Every Other Day/30 Days Discharge Instructions: Secure with tape as directed. WOUND #2: - Calcaneus Wound Laterality:  Left Cleanser: Normal Saline (Generic) 1 x Per Week/30 Days Discharge Instructions: Cleanse the wound with Normal Saline prior to applying a clean dressing using gauze sponges, not tissue or cotton balls. Cleanser: Wound Cleanser 1 x Per Week/30 Days Discharge Instructions: Cleanse the wound with wound cleanser prior to applying a clean dressing using gauze sponges, not tissue or cotton balls. Topical: keystone 1 x Per Week/30 Days Discharge Instructions: use on Left Calcaneus Prim Dressing: Endoform 2x2 in 1 x Per Week/30 Days ary Discharge Instructions: Moisten with saline. Put on after appt. with [facility]11/9/23 Secondary Dressing: Optifoam Non-Adhesive Dressing, 4x4 in (Generic) 1 x Per Week/30 Days Discharge Instructions: Apply over primary dressing as directed. Secondary Dressing: Woven Gauze Sponge, Non-Sterile 4x4 in 1 x Per Week/30 Days Discharge Instructions: Apply over primary dressing as directed. Secured With: 66M Medipore H Soft Cloth Surgical T ape, 4 x 10 (in/yd) (Generic) 1 x Per Week/30 Days Discharge Instructions: Secure with tape as directed. 06/29/2022: Both feet have improved from last week. There is epithelium making a valiant effort to creep across the surface on the left. The right side looks like it got a little dry and the deep crevasse in his midfoot has cracked. Both have eschar and there is some slough on the left. I used a curette to debride eschar from both feet as well as some slough from the left. We will continue Keystone topical antibiotic compound on the left and endoform bilaterally. He will continue to offload is much as possible. Follow-up in 1 week. Electronic Signature(s) Signed: 06/29/2022 11:24:21 AM By: Fredirick Maudlin MD FACS Entered By: Fredirick Maudlin on 06/29/2022 11:24:21 -------------------------------------------------------------------------------- HxROS Details Patient Name: Date of Service: [name], [place] 06/29/2022  10:15 A M Medical Record Number: [id number] Patient Account Number: [id number] Date of Birth/Sex: Treating RN: 1976-05-06 (46 y.o. M) Primary Care Provider: [name] Other Clinician: Referring Provider: Treating Provider/Extender: [name] in Treatment: 60 Information Obtained From Patient Constitutional Symptoms (General Health) Medical History: Past Medical History Notes: COVID PNA 07/22/2019-11/14/2019 [facility] ECMO, foot drop left foot , Eyes Medical History: Negative for: Cataracts; Glaucoma; Optic Neuritis Ear/Nose/Mouth/Throat Medical History: Negative for: Chronic sinus problems/congestion; Middle ear problems Hematologic/Lymphatic Medical History: Negative for: Anemia; Hemophilia; Human Immunodeficiency Virus; Lymphedema; Sickle Cell Disease [name], [name] ([id number]) [id number] Page 15 of 16 Respiratory Medical History: Positive for: Asthma Negative for: Aspiration; Chronic Obstructive Pulmonary Disease (COPD); Pneumothorax; Sleep Apnea; Tuberculosis Cardiovascular Medical History: Positive for: Angina - with COVID; Hypertension Negative for: Arrhythmia; Congestive Heart Failure; Coronary Artery Disease; Deep Vein Thrombosis; Hypotension; Myocardial Infarction; Peripheral Arterial Disease; Peripheral Venous Disease; Phlebitis; Vasculitis Gastrointestinal Medical History: Negative for: Cirrhosis ; Colitis; Crohns; Hepatitis A; Hepatitis B;  Hepatitis C Endocrine Medical History: Negative for: Type I Diabetes; Type II Diabetes Genitourinary Medical History: Negative for: End Stage Renal Disease Past Medical History Notes: kidney stone Immunological Medical History: Negative for: Lupus Erythematosus; Raynauds; Scleroderma Integumentary (Skin) Medical History: Negative for: History of Burn Musculoskeletal Medical History: Negative for: Gout; Rheumatoid Arthritis; Osteoarthritis;  Osteomyelitis Neurologic Medical History: Negative for: Dementia; Neuropathy; Quadriplegia; Paraplegia; Seizure Disorder Oncologic Medical History: Negative for: Received Chemotherapy; Received Radiation Psychiatric Medical History: Negative for: Anorexia/bulimia; Confinement Anxiety Past Medical History Notes: anxiety Immunizations Pneumococcal Vaccine: Received Pneumococcal Vaccination: No Implantable Devices None Hospitalization / Surgery History Type of Hospitalization/Surgery COVID PNA 07/22/2019- 11/14/2019 03/27/2020 wound debridement/ skin graft Family and Social History Cancer: Yes - Maternal Grandparents; Diabetes: Yes - Father,Paternal Grandparents; Heart Disease: Yes - Maternal Grandparents; Hereditary Spherocytosis: No; Hypertension: Yes - Father,Paternal Grandparents; Kidney Disease: No; Lung Disease: Yes - Siblings; Seizures: No; Stroke: No; Thyroid Problems: No; Tuberculosis: No; Never smoker; Marital Status - Married; Alcohol Use: Never; Drug Use: No History; Caffeine Use: Daily - tea, soda; Financial Concerns: CHINMAY, SQUIER ([id number]) [id number] Page 16 of 16 No; Food, Clothing or Shelter Needs: No; Support System Lacking: No; Transportation Concerns: No Engineer, maintenance) Signed: 06/29/2022 12:12:36 PM By: Fredirick Maudlin MD FACS Entered By: Fredirick Maudlin on 06/29/2022 11:21:48 -------------------------------------------------------------------------------- SuperBill Details Patient Name: Date of Service: [name], [place] 06/29/2022 Medical Record Number: [id number] Patient Account Number: [id number] Date of Birth/Sex: Treating RN: [birth year] (46 y.o. M) Primary Care Provider: [name] Other Clinician: Referring Provider: Treating Provider/Extender: [name] in Treatment: 102 Diagnosis Coding ICD-10 Codes Code Description [phone] Non-pressure chronic ulcer of other part of right foot  with fat layer exposed L97.522 Non-pressure chronic ulcer of other part of left foot with fat layer exposed Facility Procedures : CPT4 Code: [id number] Description: 60109 - DEBRIDE WOUND 1ST 20 SQ CM OR < ICD-10 Diagnosis Description L97.512 Non-pressure chronic ulcer of other part of right foot with fat layer exposed L97.522 Non-pressure chronic ulcer of other part of left foot with fat layer exposed Modifier: Quantity: 1 Physician Procedures : CPT4 Code Description Modifier [id number] - WC PHYS LEVEL 4 - EST PT 25 ICD-10 Diagnosis Description L97.512 Non-pressure chronic ulcer of other part of right foot with fat layer exposed L97.522 Non-pressure chronic ulcer of other part of left foot  with fat layer exposed Quantity: 1 : [id number] - WC PHYS DEBR WO ANESTH 20 SQ CM ICD-10 Diagnosis Description L97.512 Non-pressure chronic ulcer of other part of right foot with fat layer exposed L97.522 Non-pressure chronic ulcer of other part of left foot with fat layer exposed Quantity: 1 Electronic Signature(s) Signed: 06/29/2022 11:24:36 AM By: Fredirick Maudlin MD FACS Entered By: Fredirick Maudlin on 06/29/2022 11:24:35

## 2022-07-05 DIAGNOSIS — L218 Other seborrheic dermatitis: Secondary | ICD-10-CM | POA: Diagnosis not present

## 2022-07-05 DIAGNOSIS — L281 Prurigo nodularis: Secondary | ICD-10-CM | POA: Diagnosis not present

## 2022-07-07 ENCOUNTER — Encounter (HOSPITAL_BASED_OUTPATIENT_CLINIC_OR_DEPARTMENT_OTHER): Payer: PPO | Admitting: General Surgery

## 2022-07-07 DIAGNOSIS — L89623 Pressure ulcer of left heel, stage 3: Secondary | ICD-10-CM | POA: Diagnosis not present

## 2022-07-07 DIAGNOSIS — L89613 Pressure ulcer of right heel, stage 3: Secondary | ICD-10-CM | POA: Diagnosis not present

## 2022-07-07 DIAGNOSIS — L97512 Non-pressure chronic ulcer of other part of right foot with fat layer exposed: Secondary | ICD-10-CM | POA: Diagnosis not present

## 2022-07-07 NOTE — Progress Notes (Signed)
ZYKEEM, BAUSERMAN ([id number]) [id number] Page 1 of 9 Visit Report for 07/07/2022 Arrival Information Details Patient Name: Date of Service: [name], [name] [name] 07/07/2022 9:30 A M Medical Record Number: [id number] Patient Account Number: [id number] Date of Birth/Sex: Treating RN: 1975-09-11 (46 y.o. [name] Primary Care Donyelle [name]: Dorinda Hill Other Clinician: Referring Jamilya Sarrazin: Treating Deshaun Schou/Extender: Jana Hakim in Treatment: 103 Visit Information History Since Last Visit Added or deleted any medications: No Patient Arrived: Wheel Chair Any new allergies or adverse reactions: No Arrival Time: 09:52 Had a fall or experienced change in No Accompanied By: spouse activities of daily living that may affect Transfer Assistance: None risk of falls: Patient Identification Verified: Yes Signs or symptoms of abuse/neglect since last visito No Patient Requires Transmission-Based Precautions: No Hospitalized since last visit: No Patient Has Alerts: No Implantable device outside of the clinic excluding No cellular tissue based products placed in the center since last visit: Has Dressing in Place as Prescribed: Yes Pain Present Now: Yes Electronic Signature(s) Signed: 07/07/2022 4:42:06 PM By: [name] RN Entered By: [name] on 07/07/2022 10:01:54 -------------------------------------------------------------------------------- Encounter Discharge Information Details Patient Name: Date of Service: [name], [name] [place] E. 07/07/2022 9:30 A M Medical Record Number: [id number] Patient Account Number: [id number] Date of Birth/Sex: Treating RN: Jun 07, 1976 (46 y.o. [name] Primary Care Kiani Wurtzel: Dorinda Hill Other Clinician: Referring Rhylan Kagel: Treating Jewel Venditto/Extender: Jana Hakim in Treatment: [phone] Encounter Discharge Information Items Post Procedure Vitals Discharge Condition:  Stable Temperature (F): 98.3 Ambulatory Status: Wheelchair Pulse (bpm): 82 Discharge Destination: Home Respiratory Rate (breaths/min): 16 Transportation: Private Auto Blood Pressure (mmHg): 138/84 Accompanied By: spouse Schedule Follow-up Appointment: Yes Clinical Summary of Care: Patient Declined Electronic Signature(s) Signed: 07/07/2022 4:42:06 PM By: [name] RN Entered By: [name] on 07/07/2022 16:37:18 [name] ([id number]) [id number] Page 2 of 9 -------------------------------------------------------------------------------- Lower Extremity Assessment Details Patient Name: Date of Service: [name] 07/07/2022 9:30 A M Medical Record Number: [id number] Patient Account Number: [id number] Date of Birth/Sex: Treating RN: 12-04-1975 (46 y.o. [name] Primary Care Temiloluwa Recchia: Dorinda Hill Other Clinician: Referring Raymona Boss: Treating Daimon Kean/Extender: Jana Hakim in Treatment: 103 Edema Assessment Assessed: [name]: No] Franne Forts: No] Edema: [Left: N] [Right: o] Calf Left: Right: Point of Measurement: 29 cm From Medial Instep 43 cm 43.5 cm Ankle Left: Right: Point of Measurement: 9 cm From Medial Instep 26 cm 23.5 cm Vascular Assessment Pulses: Dorsalis Pedis Palpable: [Left:Yes] [Right:Yes] Electronic Signature(s) Signed: 07/07/2022 4:42:06 PM By: [name] RN Entered By: [name] on 07/07/2022 10:03:01 -------------------------------------------------------------------------------- Multi Wound Chart Details Patient Name: Date of Service: [name], [name] [place] E. 07/07/2022 9:30 A M Medical Record Number: [id number] Patient Account Number: [id number] Date of Birth/Sex: Treating RN: 03-14-1976 (46 y.o. M) Primary Care Gayatri Teasdale: Dorinda Hill Other Clinician: Referring Tijuana Scheidegger: Treating Darol Cush/Extender: Jana Hakim in Treatment: 103 Vital  Signs Height(in): 69 Pulse(bpm): 82 Weight(lbs): 280 Blood Pressure(mmHg): 138/84 Body Mass Index(BMI): 41.3 Temperature(F): 98.3 Respiratory Rate(breaths/min): 16 [1:Photos:] [N/A:N/A [id number] Page 3 of 9] Right Calcaneus Left Calcaneus N/A Wound Location: Pressure Injury Pressure Injury N/A Wounding Event: Pressure Ulcer Pressure Ulcer N/A Primary Etiology: Asthma, Angina, Hypertension Asthma, Angina, Hypertension N/A Comorbid History: 10/07/2019 10/07/2019 N/A Date Acquired: 103 103 N/A Weeks of Treatment: Open Open N/A Wound Status: No No N/A Wound Recurrence: 0.3x0.3x0.2 1.9x0.4x0.2 N/A Measurements L x W x D (cm) 0.071 0.597 N/A A (cm) : rea 0.014 0.119 N/A  Volume (cm) : 99.80% 97.80% N/A % Reduction in A rea: 100.00% 99.60% N/A % Reduction in Volume: Category/Stage III Category/Stage III N/A Classification: Medium Medium N/A Exudate A mount: Serosanguineous Serosanguineous N/A Exudate Type: red, brown red, brown N/A Exudate Color: Thickened Thickened N/A Wound Margin: Medium (34-66%) Large (67-100%) N/A Granulation A mount: Pink Red, Pink N/A Granulation Quality: Medium (34-66%) Small (1-33%) N/A Necrotic A mount: Eschar, Adherent Slough Eschar, Adherent Slough N/A Necrotic Tissue: Fat Layer (Subcutaneous Tissue): Yes Fat Layer (Subcutaneous Tissue): Yes N/A Exposed Structures: Fascia: No Fascia: No Tendon: No Tendon: No Muscle: No Muscle: No Joint: No Joint: No Bone: No Bone: No Large (67-100%) Medium (34-66%) N/A Epithelialization: Debridement - Selective/Open Wound Debridement - Selective/Open Wound N/A Debridement: Pre-procedure Verification/Time Out 10:09 10:09 N/A Taken: Lidocaine 5% topical ointment Lidocaine 5% topical ointment N/A Pain Control: Necrotic/Eschar, Callus Necrotic/Eschar, Callus N/A Tissue Debrided: Non-Viable Tissue Non-Viable Tissue N/A Level: 0.5 1.25 N/A Debridement A (sq  cm): rea Curette Curette N/A Instrument: Minimum Minimum N/A Bleeding: Pressure Pressure N/A Hemostasis A chieved: 0 0 N/A Procedural Pain: 0 0 N/A Post Procedural Pain: Procedure was tolerated well Procedure was tolerated well N/A Debridement Treatment Response: 1x0.5x0.2 2.5x0.5x0.2 N/A Post Debridement Measurements L x W x D (cm) 0.079 0.196 N/A Post Debridement Volume: (cm) Category/Stage III Category/Stage III N/A Post Debridement Stage: Callus: Yes Callus: Yes N/A Periwound Skin Texture: Scarring: Yes No Abnormalities Noted Maceration: Yes N/A Periwound Skin Moisture: No Abnormalities Noted No Abnormalities Noted N/A Periwound Skin Color: No Abnormality No Abnormality N/A Temperature: Debridement Debridement N/A Procedures Performed: Treatment Notes Electronic Signature(s) Signed: 07/07/2022 10:54:08 AM By: [name] MD FACS Entered By: [name] on 07/07/2022 10:54:08 -------------------------------------------------------------------------------- Multi-Disciplinary Care Plan Details Patient Name: Date of Service: [name], [name] [place] E. 07/07/2022 9:30 A M Medical Record Number: [id number] Patient Account Number: [id number] Date of Birth/Sex: Treating RN: [birth year] (46 y.o. [name] Primary Care Panayiota Larkin: Dorinda Hill Other Clinician: Referring Aevah Stansbery: Treating Lorissa Kishbaugh/Extender: Jana Hakim in Treatment: [phone] Multidisciplinary Care Plan reviewed with physician [address] MILANO, ROSEVEAR ([id number]) [id number] Page 4 of 9 Wound/Skin Impairment Nursing Diagnoses: Impaired tissue integrity Knowledge deficit related [name] ulceration/compromised skin integrity Goals: Patient/caregiver will verbalize understanding of skin care regimen Date Initiated: 07/15/2020 Target Resolution Date: 03/08/2023 Goal Status: Active Ulcer/skin breakdown will have a volume reduction of 30% by week  4 Date Initiated: 07/15/2020 Date Inactivated: 08/20/2020 Target Resolution Date: 09/03/2020 Goal Status: Unmet Unmet Reason: no major changes. Ulcer/skin breakdown will heal within 14 weeks Date Initiated: 12/04/2020 Date Inactivated: 12/10/2020 Target Resolution Date: 12/10/2020 Unmet Reason: wounds still open at 14 Goal Status: Unmet weeks and today 21 weeks. Interventions: Assess patient/caregiver ability [name] obtain necessary supplies Assess patient/caregiver ability [name] perform ulcer/skin care regimen upon admission and as needed Assess ulceration(s) every visit Provide education on ulcer and skin care Treatment Activities: Skin care regimen initiated : 07/15/2020 Topical wound management initiated : 07/15/2020 Notes: Electronic Signature(s) Signed: 07/07/2022 4:42:06 PM By: [name] RN Entered By: [name] on 07/07/2022 16:32:28 -------------------------------------------------------------------------------- Pain Assessment Details Patient Name: Date of Service: [name], [name] [place] E. 07/07/2022 9:30 A M Medical Record Number: [id number] Patient Account Number: [id number] Date of Birth/Sex: Treating RN: 11-May-1976 (46 y.o. [name] Primary Care Aliveah Gallant: Dorinda Hill Other Clinician: Referring Aashish Hamm: Treating Kyrielle Urbanski/Extender: Jana Hakim in Treatment: [phone] Active Problems Location of Pain Severity and Description of Pain Patient Has Paino Yes Site Locations Pain Location: Generalized  Pain With Dressing Change: No Duration of the Pain. Constant / Intermittento Constant Rate the pain. Current Pain Level: 3 Worst Pain Level: 10 Least Pain Level: 2 Tolerable Pain Level: 5 [name], [name] ([id number]) [id number] Page 5 of 9 Character of Pain Describe the Pain: Difficult [name] [place] Pain Management and Medication Current Pain Management: Medication: Yes Cold Application: No Rest: Yes Massage:  No Activity: No T.MckenzieN.S.: No Heat Application: No Leg drop or elevation: No Is the Current Pain Management Adequate: Adequate Electronic Signature(s) Signed: 07/07/2022 4:42:06 PM By: [name] RN Entered By: [name] on 07/07/2022 10:02:55 -------------------------------------------------------------------------------- Patient/Caregiver Education Details Patient Name: Date of Service: [name], [name] 11/30/2023andnbsp9:30 A M Medical Record Number: [id number] Patient Account Number: [id number] Date of Birth/Gender: Treating RN: 1975/08/28 (46 y.o. [name] Primary Care Physician: Dorinda Hill Other Clinician: Referring Physician: Treating Physician/Extender: Jana Hakim in Treatment: [phone] Education Assessment Education Provided [name]: Patient Education Topics Provided Wound/Skin Impairment: Methods: Explain/Verbal Responses: Return demonstration correctly Electronic Signature(s) Signed: 07/07/2022 4:42:06 PM By: [name] RN Entered By: [name] on 07/07/2022 16:32:47 -------------------------------------------------------------------------------- Wound Assessment Details Patient Name: Date of Service: [name], [name] [name] 07/07/2022 9:30 A M Medical Record Number: [id number] Patient Account Number: [id number] Date of Birth/Sex: Treating RN: 11/12/1975 (46 y.o. [name] Primary Care Korie Streat: Dorinda Hill Other Clinician: Referring Jenay Morici: Treating Demetric Parslow/Extender: Jana Hakim in Treatment: 103 Wound Status Wound Number: 1 Primary Etiology: Pressure Ulcer Wound Location: Right Calcaneus Wound Status: Open Wounding Event: Pressure Injury Comorbid History: Asthma, Angina, Hypertension Date Acquired: 10/07/2019 Weeks Of Treatment: 103 Clustered Wound: No [name], [name] ([id number]) [id number] Page 6 of 9 Photos Wound Measurements Length: (cm)  0.3 Width: (cm) 0.3 Depth: (cm) 0.2 Area: (cm) 0.071 Volume: (cm) 0.014 % Reduction in Area: 99.8% % Reduction in Volume: 100% Epithelialization: Large (67-100%) Tunneling: No Undermining: No Wound Description Classification: Category/Stage III Wound Margin: Thickened Exudate Amount: Medium Exudate Type: Serosanguineous Exudate Color: red, brown Foul Odor After Cleansing: No Slough/Fibrino Yes Wound Bed Granulation Amount: Medium (34-66%) Exposed Structure Granulation Quality: Pink Fascia Exposed: No Necrotic Amount: Medium (34-66%) Fat Layer (Subcutaneous Tissue) Exposed: Yes Necrotic Quality: Eschar, Adherent Slough Tendon Exposed: No Muscle Exposed: No Joint Exposed: No Bone Exposed: No Periwound Skin Texture Texture Color No Abnormalities Noted: No No Abnormalities Noted: Yes Callus: Yes Temperature / Pain Temperature: No Abnormality Moisture No Abnormalities Noted: Yes Treatment Notes Wound #1 (Calcaneus) Wound Laterality: Right Cleanser Normal Saline Discharge Instruction: Cleanse the wound with Normal Saline prior [name] applying a clean dressing using gauze sponges, not tissue or cotton balls. Wound Cleanser Discharge Instruction: Cleanse the wound with wound cleanser prior [name] applying a clean dressing using gauze sponges, not tissue or cotton balls. Peri-Wound Care Topical Primary Dressing Promogran Prisma Matrix, 4.34 (sq in) (silver collagen) Discharge Instruction: Moisten collagen with saline or hydrogel Secondary Dressing ALLEVYN Heel 4 1/2in x 5 1/2in / 10.5cm x 13.5cm Discharge Instruction: Apply over primary dressing as directed. Woven Gauze Sponge, Non-Sterile 4x4 in Discharge Instruction: Apply over primary dressing as directed. Secured With Conforming Stretch Gauze Bandage, Sterile 2x75 (in/in) Discharge Instruction: Secure with stretch gauze as directed. 51M Medipore H Soft Cloth Surgical Tape, 4 x 10 (in/yd) [name], [name] ([id number])  [id number] Page 7 of 9 Discharge Instruction: Secure with tape as directed. Compression Wrap Compression Stockings Add-Ons Electronic Signature(s) Signed: 07/07/2022 4:42:06 PM By: [name] RN Entered By: [name] on 07/07/2022 10:13:51 --------------------------------------------------------------------------------  Wound Assessment Details Patient Name: Date of Service: [name] 07/07/2022 9:30 A M Medical Record Number: [id number] Patient Account Number: [id number] Date of Birth/Sex: Treating RN: 13-Nov-1975 (46 y.o. [name] Primary Care Ellwyn Ergle: Dorinda Hill Other Clinician: Referring [name]: Treating Teren Franckowiak/Extender: Jana Hakim in Treatment: 103 Wound Status Wound Number: 2 Primary Etiology: Pressure Ulcer Wound Location: Left Calcaneus Wound Status: Open Wounding Event: Pressure Injury Comorbid History: Asthma, Angina, Hypertension Date Acquired: 10/07/2019 Weeks Of Treatment: 103 Clustered Wound: No Photos Wound Measurements Length: (cm) 1.9 Width: (cm) 0.4 Depth: (cm) 0.2 Area: (cm) 0.597 Volume: (cm) 0.119 % Reduction in Area: 97.8% % Reduction in Volume: 99.6% Epithelialization: Medium (34-66%) Tunneling: No Undermining: No Wound Description Classification: Category/Stage III Wound Margin: Thickened Exudate Amount: Medium Exudate Type: Serosanguineous Exudate Color: red, brown Foul Odor After Cleansing: No Slough/Fibrino Yes Wound Bed Granulation Amount: Large (67-100%) Exposed Structure Granulation Quality: Red, Pink Fascia Exposed: No Necrotic Amount: Small (1-33%) Fat Layer (Subcutaneous Tissue) Exposed: Yes Necrotic Quality: Eschar, Adherent Slough Tendon Exposed: No Muscle Exposed: No Joint Exposed: No Bone Exposed: No [name], [name] ([id number]) [id number] Page 8 of 9 Periwound Skin Texture Texture Color No Abnormalities  Noted: No No Abnormalities Noted: Yes Callus: Yes Temperature / Pain Scarring: Yes Temperature: No Abnormality Moisture No Abnormalities Noted: No Maceration: Yes Treatment Notes Wound #2 (Calcaneus) Wound Laterality: Left Cleanser Normal Saline Discharge Instruction: Cleanse the wound with Normal Saline prior [name] applying a clean dressing using gauze sponges, not tissue or cotton balls. Wound Cleanser Discharge Instruction: Cleanse the wound with wound cleanser prior [name] applying a clean dressing using gauze sponges, not tissue or cotton balls. Peri-Wound Care Topical keystone Discharge Instruction: use on Left Calcaneus Primary Dressing Endoform 2x2 in Discharge Instruction: Moisten with saline or Hydrogel. Put on after appt. with [facility]11/9/23 Secondary Dressing Optifoam Non-Adhesive Dressing, 4x4 in Discharge Instruction: Apply over primary dressing as directed. Woven Gauze Sponge, Non-Sterile 4x4 in Discharge Instruction: Apply over primary dressing as directed. Secured With 68M Medipore H Soft Cloth Surgical T ape, 4 x 10 (in/yd) Discharge Instruction: Secure with tape as directed. Compression Wrap Compression Stockings Add-Ons Electronic Signature(s) Signed: 07/07/2022 4:42:06 PM By: [name] RN Entered By: [name] on 07/07/2022 10:14:43 -------------------------------------------------------------------------------- Vitals Details Patient Name: Date of Service: [name], [name] [place] E. 07/07/2022 9:30 A M Medical Record Number: [id number] Patient Account Number: [id number] Date of Birth/Sex: Treating RN: 26-Feb-1976 (46 y.o. [name] Primary Care Liller Yohn: Dorinda Hill Other Clinician: Referring Lemarcus Baggerly: Treating Eilan Mcinerny/Extender: Jana Hakim in Treatment: 103 Vital Signs Time Taken: 09:59 Temperature (F): 98.3 Height (in): 69 Pulse (bpm): 82 Weight (lbs): 280 Respiratory Rate (breaths/min): 16 Body  Mass Index (BMI): 41.3 Blood Pressure (mmHg): 138/84 Reference Range: 80 - 120 mg / dl [name], [name] ([id number]) [id number] Page 9 of 9 Electronic Signature(s) Signed: 07/07/2022 4:42:06 PM By: [name] RN Entered By: [name] on 07/07/2022 10:02:20

## 2022-07-07 NOTE — Progress Notes (Signed)
TACUMA, GRAFFAM ([id number]) [id number] Page 1 of 16 Visit Report for 07/07/2022 Chief Complaint Document Details Patient Name: Date of Service: [name], [name] 07/07/2022 9:30 A M Medical Record Number: [id number] Patient Account Number: [id number] Date of Birth/Sex: Treating RN: 21-Mar-1976 (46 y.o. M) Primary Care Provider: Cristie [name] Other Clinician: Referring Provider: Treating Provider/Extender: [name] in Treatment: 103 Information Obtained from: Patient Chief Complaint Bilateral Plantar Foot Ulcers Electronic Signature(s) Signed: 07/07/2022 10:54:19 AM By: [name] Maudlin MD FACS Entered By: [name] on 07/07/2022 10:54:19 -------------------------------------------------------------------------------- Debridement Details Patient Name: Date of Service: [name], [name] 07/07/2022 9:30 A M Medical Record Number: [id number] Patient Account Number: [id number] Date of Birth/Sex: Treating RN: 1975/09/23 (46 y.o. [name] Primary Care Provider: Cristie [name] Other Clinician: Referring Provider: Treating Provider/Extender: [name] in Treatment: 103 Debridement Performed for Assessment: Wound #1 Right Calcaneus Performed By: Physician [name] Maudlin, MD Debridement Type: Debridement Level of Consciousness (Pre-procedure): Awake and Alert Pre-procedure Verification/Time Out Yes - 10:09 Taken: Start Time: 10:09 Pain Control: Lidocaine 5% topical ointment T Area Debrided (L x W): otal 1 (cm) x 0.5 (cm) = 0.5 (cm) Tissue and other material debrided: Non-Viable, Callus, Eschar Level: Non-Viable Tissue Debridement Description: Selective/Open Wound Instrument: Curette Bleeding: Minimum Hemostasis Achieved: Pressure End Time: 10:10 Procedural Pain: 0 Post Procedural Pain: 0 Response to Treatment: Procedure was tolerated well Level of Consciousness (Post- Awake and  Alert procedure): Post Debridement Measurements of Total Wound Length: (cm) 1 Stage: Category/Stage III Width: (cm) 0.5 Depth: (cm) 0.2 Volume: (cm) 0.079 Character of Wound/Ulcer Post Debridement: Improved Post Procedure Diagnosis [name] ([id number]) [id number] Page 2 of 16 Same as Pre-procedure Notes Scribed for Dr. Celine [name] by J.Scotton Electronic Signature(s) Signed: 07/07/2022 12:45:53 PM By: [name] Maudlin MD FACS Signed: 07/07/2022 4:42:06 PM By: [name] Entered By: [name] on 07/07/2022 10:15:55 -------------------------------------------------------------------------------- Debridement Details Patient Name: Date of Service: [name], [name] 07/07/2022 9:30 A M Medical Record Number: [id number] Patient Account Number: [id number] Date of Birth/Sex: Treating RN: 07-31-1976 (46 y.o. [name] Primary Care Provider: Cristie [name] Other Clinician: Referring Provider: Treating Provider/Extender: [name] in Treatment: 103 Debridement Performed for Assessment: Wound #2 Left Calcaneus Performed By: Physician [name] Maudlin, MD Debridement Type: Debridement Level of Consciousness (Pre-procedure): Awake and Alert Pre-procedure Verification/Time Out Yes - 10:09 Taken: Start Time: 10:09 Pain Control: Lidocaine 5% topical ointment T Area Debrided (L x W): otal 2.5 (cm) x 0.5 (cm) = 1.25 (cm) Tissue and other material debrided: Non-Viable, Callus, Eschar Level: Non-Viable Tissue Debridement Description: Selective/Open Wound Instrument: Curette Bleeding: Minimum Hemostasis Achieved: Pressure End Time: 10:10 Procedural Pain: 0 Post Procedural Pain: 0 Response to Treatment: Procedure was tolerated well Level of Consciousness (Post- Awake and Alert procedure): Post Debridement Measurements of Total Wound Length: (cm) 2.5 Stage: Category/Stage III Width: (cm) 0.5 Depth: (cm)  0.2 Volume: (cm) 0.196 Character of Wound/Ulcer Post Debridement: Improved Post Procedure Diagnosis Same as Pre-procedure Notes Scribed for Dr. Celine [name] by J.S. Electronic Signature(s) Signed: 07/07/2022 12:45:53 PM By: [name] Maudlin MD FACS Signed: 07/07/2022 4:42:06 PM By: [name] Entered By: [name] on 07/07/2022 10:17:12 [name] ([id number]) [id number] Page 3 of 16 -------------------------------------------------------------------------------- HPI Details Patient Name: Date of Service: [name] Grammes [place] E. 07/07/2022 9:30 A M Medical Record Number: [id number] Patient Account Number: [id number] Date of Birth/Sex: Treating RN: 08-27-1975 (46 y.o. M) Primary Care Provider: Cristie [name]  Other Clinician: Referring Provider: Treating Provider/Extender: [name] in Treatment: 103 History of Present Illness HPI Description: Wounds are12/03/2020 upon evaluation today patient presents for initial inspection here in our clinic concerning issues he has been having with the bottoms of his feet bilaterally. He states these actually occurred as wounds when he was hospitalized for 5 months secondary to Covid. He was apparently with tilting bed where he was in an upright position quite frequently and apparently this occurred in some way shape or form during that time. Fortunately there is no sign of active infection at this time. No fevers, chills, nausea, vomiting, or diarrhea. With that being said he still has substantial wounds on the plantar aspects of his feet Theragen require quite a bit of work to get these to heal. He has been using Santyl currently though that is been problematic both in receiving the medication as well as actually paid for it as it is become quite expensive. Prior to the experience with Covid the patient really did not have any major medical problems other than hypertension he does have some  mild generalized weakness following the Covid experience. 07/22/2020 on evaluation today patient appears to be doing okay in regard to his foot ulcers I feel like the wound beds are showing signs of better improvement that I do believe the Iodoflex is helping in this regard. With that being said he does have a lot of drainage currently and this is somewhat blue/green in nature which is consistent with Pseudomonas. I do think a culture today would be appropriate for [place] to evaluate and see if that is indeed the case I would likely start him on antibiotic orally as well he is not allergic to Cipro knows of no issues he has had in the past 12/21; patient was admitted to the clinic earlier this month with bilateral presumed pressure ulcers on the bottom of his feet apparently related to excessive pressure from a tilt table arrangement in the intensive care unit. Patient relates this to being on ECMO but I am not really sure that is exactly related to that. I must say I have never seen anything like this. He has fairly extensive full-thickness wounds extending from his heel towards his midfoot mostly centered laterally. There is already been some healing distally. He does not appear to have an arterial issue. He has been using gentamicin to the wound surfaces with Iodoflex to help with ongoing debridement 1/6; this is a patient with pressure ulcers on the bottom of his feet related to excessive pressure from a standing position in the intensive care unit. He is complaining of a lot of pain in the right heel. He is not a diabetic. 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 We have been using Iodoflex to help prepare the surfaces of both wounds for an advanced treatment product. He is nonambulatory spending most of his time in a wheelchair I have asked him not to propel the wheelchair with his heels 1/13; in general his wounds look better not much surface area change we have been using  Iodoflex as of last week. I did an x-ray of the right heel as the patient was complaining of pain. I had some thoughts about a stress fracture perhaps Achilles tendon problems however what it showed was erosive changes along the inferior aspect of the calcaneus he now has a MRI booked for 1/20. 1/20; in general his wounds continue to be better. Some improvement in the  large narrow areas proximally in his foot. He is still complaining of pain in the right heel and tenderness in certain areas of this wound. His MRI is tonight. I am not just looking for osteomyelitis that was brought up on the x-ray I am wondering about stress fractures, tendon ruptures etc. He has no such findings on the left. Also noteworthy is that the patient had critical illness neuropathy and some of the discomfort may be actual improvement in nerve function I am just not sure. These wounds were initially in the setting of severe critical illness related to COVID-19. He was put in a standing position. He may have also been on pressors at the point contributing to tissue ischemia. By his description at some point these wounds were grossly necrotic extending proximally up into the Achilles part of his heel. I do not know that I have ever really seen pictures of them like this although they may exist in epic We have ordered Tri layer Oasis. I am trying to stimulate some granulation in these areas. This is of course assuming the MRI is negative for infection 1/27; since the patient was last here he saw Dr. Juleen China of infectious disease. He is planned for vancomycin and ceftriaxone. Prior operative culture grew MSSA. Also ordered baseline lab work. He also ordered arterial studies although the ABIs in our clinic were normal as well as his clinical exam these were normal I do not think he needs to see vascular surgery. His ABIs at the [facility] were 1.22 in the right triphasic waveforms with a normal TBI of 1.15 on the left ABI of 1.22 with  triphasic waveforms and a normal TBI of 1.08. Finally he saw Dr. Amalia Hailey who will follow him in for 2 months. At this point I do not think he felt that he needed a procedure on the right calcaneal bone. Dr. Juleen China is elected for broad-spectrum antibiotic The patient is still having pain in the right heel. He walks with a walker 2/3; wounds are generally smaller. He is tolerating his IV antibiotics. I believe this is vancomycin and ceftriaxone. We are still waiting for Oasis burn in terms of his out-of-pocket max which he should be meeting soon given the IV antibiotics, MRIs etc. I have asked him to check in on this. We are using silver collagen in the meantime the wounds look better 2/10; tolerating IV vancomycin and Rocephin. We are waiting to apply for Oasis. Although I am not really sure where he is in his out-of-pocket max. 2/17 started the first application of Oasis trilayer. Still on antibiotics. The wounds have generally look better. The area on the left has a little more surface slough requiring debridement 2/54; second application of Oasis trilayer. The wound surface granulation is generally look better. The area on the left with undermining laterally I think is come in a bit. 10/08/2020 upon evaluation today patient is here today for [employer] application #3. Fortunately he seems to be doing extremely well with regard to this and we are seeing a lot of new epithelial growth which is great news. Fortunately there is no signs of active infection at this time. 10/16/2020 upon evaluation today patient appears to be doing well with regard to his foot ulcers. Do believe the Oasis has been of benefit for him. I do not see any signs of infection right now which is great news and I think that he has a lot of new epithelial growth which is great to see as well.  The patient is very pleased to hear all of this. I do think we can proceed with the Oasis trilayer #4 today. 3/18; not as much improvement  in these areas on his heels that I was hoping. I did reapply trilateral Oasis today the tissue looks healthier but not as much fill in as I was hoping. 3/25; better looking today I think this is come in a bit the tissue looks healthier. Triple layer Oasis reapplied #6 4/1; somewhat better looking definitely better looking surface not as much change in surface area as I was hoping. He may be spending more time [place] on days then he needs to although he does have heel offloading boots. Triple layer Oasis reapplied #7 4/7; unfortunately apparently [facility] will not approve any further Oasis which is unfortunate since the patient did respond nicely both in terms of the condition of the wound bed as well as surface area. There is still some drainage coming from the wound but not a lot there does not appear to be any infection 4/15; we have been using Hydrofera Blue. He continues to have nice rims of epithelialization on the right greater than the left. The left the epithelialization is coming from the tip of his heel. There is moderate drainage. In this that concerns me about a total contact cast. There is no evidence of infection 4/29; patient has been using Hydrofera Blue with dressing changes. He has no complaints or issues today. 5/5; using Hydrofera Blue. I actually think that he looks marginally better than the last time I saw this 3 weeks ago. There are rims of epithelialization on the left thumb coming from the medial side on the right. Using [address] [name], [name] ([id number]) [id number] Page 4 of 16 5/12; using Hydrofera Blue. These continue to make improvements in surface area. His drainage was not listed as severe I therefore went ahead and put a cast on the left foot. Right foot we will continue to dress his previous 5/16; back for first total contact cast change. He did not tolerate this particularly well cast injury on the anterior tibia  among other issues. Difficulty sleeping. I talked him about this in some detail and afterwards is elected to continue. I told him I would like to have a cast on for 3 weeks to see if this is going to help at all. I think he agreed 5/19; I think the wound is better. There is no tunneling towards his midfoot. The undermining medially also looks better. He has a rim of new skin distally. I think we are making progress here. The area on the left also continues to look somewhat better to me using Hydrofera Blue. He has a list of complaints about the cast but none of them seem serious 5/26; patient presents for 1 week follow-up. He has been using a total contact cast and tolerating this well. Hydrofera Blue is the main dressing used. He denies signs of infection. 6/2 Hydrofera Blue total contact cast on the left. These were large ulcers that formed in intensive care unit where the patient was recovering from [place]. May have had something to do with being ventilated in an upright positiono Pressors etc. We have been able to get the areas down considerably and a viable surface. There is some epithelialization in both sides. Note made of drainage 6/9; changed to [facility] last time because of drainage. He arrives with better looking surfaces and dimensions on the left than the right. Paradoxically  the right actually probes more towards his midfoot the left is largely close down but both of these look improved. Using a total contact cast on the left 6/16; complex wounds on his bilateral plantar heels which were initially pressure injury from a stay in the ICU with COVID. We have been using silver alginate most recently. His dimensions of come in quite dramatically however not recently. We have been putting the left foot in a total contact cast 6/23; complex wounds on the bilateral plantar heels. I been putting the left in the cast paradoxically the area on the right is the one that is going towards  closure at a faster rate. Quite a bit of drainage on the left. The patient went to see Dr. Amalia Hailey who said he was going to standby for skin grafts. I had actually considered sending him for skin grafts however he would be mandatorily off his feet for a period of weeks to months. I am thinking that the area on the right is going to close on its own the area on the left has been more stubborn even though we have him in a total contact cast 6/30; took him out of a total contact cast last week is the right heel seem to be making better progress than the left where I was placing the cast. We are using silver alginate. Both wounds are smaller right greater than left 7/12; both wounds look as though they are making some progress. We are using silver alginate. Heel offloading boots 7/26; very gradual progress especially on the right. Using silver alginate. He is wearing heel offloading boots 8/18; he continues to close these wounds down very gradually. Using silver alginate. The problem polymen being definitive about this is areas of what appears to be callus around the margins. This is not a 100% of the area but certainly sizable especially on the right 9/1; bilateral plantar feet wounds secondary to prolonged pressure while being ventilated for COVID-19 in an upright position. Essentially pressure ulcers on the bottom of his feet. He is made substantial progress using silver alginate. 9/14; bilateral plantar feet wounds secondary to prolonged pressure. Making progress using silver alginate. 9/29 bilateral plantar feet wounds secondary to prolonged pressure. I changed him to Iodoflex last week. MolecuLight showing reddened blush fluorescence 10/11; patient presents for follow-up. He has no issues or complaints today. He denies signs of infection. He continues to use Iodoflex and antibiotic ointment to the wound beds. 10/27; 2-week follow-up. No evidence of infection. He has callus and thick dry skin around  the wound margins we have been using Iodoflex and Bactroban which was in response to a moderate left MolecuLight reddish blush fluorescence. 11/10; 2-week follow-up. Wound margins again have thick callus however the measurements of the actual wound sites are a lot smaller. Everything looks reasonably healthy here. We have been using Iodoflex He was approved for prime matrix but I have elected to delay this given the improvement in the surface area. Hopefully I will not regret that decision as were getting close to the end of the year in terms of insurance payment 12/8; 2-week follow-up. Wounds are generally smaller in size. These were initially substantial wounds extending into the forefoot all the way into the heel on the bilateral plantar feet. They are now both located on the plantar heel distal aspect both of these have a lot of callus around the wounds I used a #5 curette to remove this on the right and the left also some  subcutaneous debris to try and get the wound edges were using Iodoflex. He has heel offloading shoe 12/22; 2-week follow-up. Not really much improvement. He has thick callus around the outer edges of both wounds. I remove this there is some nonviable subcutaneous tissue as well. We have been using Iodoflex. Her intake nurse and myself spontaneously thought of a total contact cast I went back in May. At that time we really were not seeing much of an improvement with a cast although the wound was in a much different situation I would like to retry this in 2 weeks and I discussed this with the patient 08/12/2021; the patient has had some improvement with the Iodoflex. The the area on the left heel plantar more improved than the right. I had to put him in a total contact cast on the left although I decided to put that off for 2 weeks. I am going to change his primary dressing to silver collagen. I think in both areas he has had some improvement most of the healing seems to be more  proximal in the heel. The wounds are in the mid aspect. A lot of thick callus on the right heel however. 1/19; we are using silver collagen on both plantar heel areas. He has had some improvement today. The left did not require any debridement. He still had some eschar on the right that was debrided but both seem to have contracted. I did not put it total contact cast on him today 2/2 we have been using silver collagen. The area on the right plantar heel has areas that appear to be epithelialized interspersed with dry flaking callus and dry skin. I removed this. This really looks better than on the other side. On the left still a large area with raised edges and debris on the surface. 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 He has no issues or complaints today. 2/9; not much change in the left foot wound with 1 week of a cast we are using silver collagen. Silver collagen on the right side. The right side has been the better wound surface. We will reapply the total contact cast on the left 2/16; not much improvement on either side I been using silver collagen with a total contact cast on the left. I'm changing the Hydrofera Blue still with a total contact cast on the left 2/23; some improvement on both sides. Disappointing that he has thick callus around the area that we are putting in a total contact cast on the left. We've been using Hydrofera Blue on both wound areas. This is a man who at essentially pressure ulcers in addition to ischemia caused by medications to support his blood pressure (pressors) in the ICU. He was being ventilated in the standing position for severe Covid. A Shiley the wounds extended across his entire foot but are now localized to his plantar heels bilaterally. We have made progress however neither areas healed. I continue to think the total  contact cast is helped albeit painstakingly slowly. He has never wanted a plastic surgery consult although I don't know that they would be interested in grafting in area in this location. 10/07/2021: Continued improvement bilaterally. There is still some callus around the left wound, despite the total contact cast. He has some increased pain in his right midfoot around 1 particular area. This has been  painful in the past but seems to be a little bit worse. When his cast was removed today, there was an area on the heel of the left foot that looks a bit macerated. He is also complaining of pain in his left thigh and hip which he thinks is secondary to the limb length discrepancy caused by the cast. 10/14/2021: He continues to improve. A little bit less callus around the left wound. He continues to endorse pain in his right midfoot, but this is not as significant as it was last week. The maceration on his left heel is improved. 10/21/2021: Continued improvement to both wounds. The maceration on his left heel is no longer evident. Less callus bilaterally. Epithelialization progressing. 10/28/2021: Significant improvement this week. The right sided wound is nearly closed with just a small open area at the middle. No maceration seen on the left heel. Continued epithelialization on both sides. No concern for infection. 11/04/2021: T oday, the wounds were measured a little bit differently and come out as larger, but I actually think they are about the same to potentially even smaller, particularly on the left. He continues to accumulate some callus on the right. [name], [name] ([id number]) [id number] Page 5 of 16 11/11/2021: T oday, the patient is expressing some concern that the left wound, despite being in the total contact cast, is not progressing at the same rate as the 1 on the right. He is interested in trying a week without the cast to see how the wound does. The wounds are roughly  the same size as last week, with the right perhaps being a little bit smaller. He continues to build up callus on both sites. 11/18/2021: Last week, I permitted the patient to go without his total contact cast, just to see if the cast was really making any difference. Today, both wounds have deteriorated to some extent, suggesting that the cast is providing benefit, at least on the left. Both are larger and have accumulated callus, slough, and other debris. 11/26/2021: I debrided both wounds quite aggressively last week in an effort to stimulate the healing cascade. This appears to have been effective as the left sided wound is a full centimeter shorter in length. Although the right was measured slightly larger, on inspection, it looks as though an area of epithelialized tissue was included in the measurements. We have been using PolyMem Ag on the wound surfaces with a total contact cast to the left. 12/02/2021: It appears that the intake personnel are including epithelialized tissue in his wound measurements; the right wound is almost completely epithelialized; there is just a crater at the proximal midfoot with some open areas. On the left, he has built up some callus, but the overall wound surface looks good. There is some senescent skin around the wound margin. He has been in PolyMem Ag bilaterally with a total contact cast on the left. 12/09/2021: The right wound is nearly closed; there is just a small open area at the mid calcaneus. On the left, the wound is smaller with minimal callus buildup. No significant drainage. 12/16/2021: The right calcaneal wound remains minimally open at the mid calcaneus; the rest has epithelialized. On the left, the wound is also a little bit smaller. There is some senescent tissue on the periphery. He is getting his first application of a trial skin substitute called Vendaje today. 12/23/2021: The wound on his right calcaneus is nearly closed; there is just a small area  at the most distal aspect of  the calcaneus that is open. On the left, the area where we applied to the skin substitute has a healthier-looking bed of granulation tissue. The wound dimensions are not significantly different on this side but the wound surface is improved. 12/30/2021: The wound on the right calcaneus has not changed significantly aside from some accumulation of callus. On the left, the open area is smaller and continues to have an improved surface. He continues to accumulate callus around the wound. He is here for his third application of Vendaje. 01/06/2022: The right calcaneal wound is down to just a couple of millimeters. He continues to accumulate periwound callus. He unfortunately got his cast wet earlier in the week and his left foot is macerated, resulting in some superficial skin loss just distal to the open wound. The open wound itself, however, is much smaller and has a healthier appearing surface. He is here for his fourth application of Vendaje. 01/13/2022: The right calcaneal wound is about the same. Unfortunately, once again, his cast got wet and his foot is again macerated. This is caused the left calcaneal wound to enlarge. He is here for his fifth application of Vendaje. 01/20/2022: The right calcaneal wound is very small. There is some periwound callus accumulation. He purchased a new cast protector last week and this has been effective in avoiding the maceration that has been occurring on the left. The left calcaneal wound is narrower and has a healthy and viable-appearing surface. He is here for his 6 application of Vendaje. 01/27/2022: The right calcaneal wound is down to just a pinhole. There is some periwound slough and callus. On the left, the wound is narrower and shorter by about a centimeter. The surface is robust and viable-appearing. Unfortunately, the rep for the trial skin substitute product did not provide any for [place] to use today. 02/04/2022: The right calcaneal  wound remains unchanged. There is more accumulated callus. On the left, although the intake nurse measured it a little bit longer, it looks about the same to me. The surface has a layer of slough, but underneath this, there is good granulation tissue. 02/10/2022: The right calcaneus wound is nearly closed. There is still some callus that builds up around the site. The left side looks about the same in terms of dimensions, but the surface is more robust and vital-appearing. 02/16/2022: The area of the right calcaneus that was nearly closed last week has closed, but there is a small opening at the mid foot where it looks like some moisture got retained and caused some reopening. The left foot wound is narrower and shallower. Both sites have a fair amount of periwound callus and eschar. 02/24/2022: The small midfoot opening on the right calcaneus is a little bit smaller today. The left foot wound is narrower and shallower. He continues to accumulate periwound callus. No concern for infection. 03/01/2022: The patient came to clinic early because he showered and got his cast wet. Fortunately, there is no significant maceration to his foot but the callus softened and it looks like the wound on his left calcaneus may be a little bit wider. The wound on his right calcaneus is just a narrow slit. Continued accumulation of periwound callus bilaterally. 03/08/2022: The wound on his right calcaneus is very nearly closed, just a small pinpoint opening under a bit of eschar; the left wound has come in quite a bit, as well. It is narrower and shorter than at our last visit. Still with accumulated callus and eschar bilaterally. 03/17/2022:  The right calcaneal wound is healed. The left wound is smaller and the surface itself is very clean, but there is some blue-green staining on the periwound callus, concerning for Pseudomonas aeruginosa. 03/23/2022: The right calcaneal wound remains closed. The left wound continues to  contract. No further blue-green staining. Small amount of callus and slough accumulation. 03/28/2022: He came in early today because he had gotten his cast wet. On inspection, the wound itself did not get wet or macerated, just a little bit of the forefoot. The wound itself is basically unchanged. 04/07/2022: The right foot wound remains closed. The left wound is the smallest that I have seen it to date. It is narrower and shorter. It still continues to accumulate slough on the surface. 04/15/2022: There is a band of epithelium now dividing the small left plantar foot wound in 2. There is still some slough on the surface. 04/21/2022: The wound continues to narrow. Just a little bit of slough on the surface. He seems to be responding well to endoform. 04/28/2022: Continued slow contraction of the wound. There is a little slough on the surface and some periwound callus. We have been using endoform and total contact cast. 05/05/2022: The wound appears to have stalled. There is slough and some periwound eschar/callus. No concern for infection, however. 05/12/2022: Unfortunately, his right foot has reopened. It is located at the most posterior aspect of his surgical incision. The area was noted to have drainage coming from it when his padding was removed today. Underneath some callus and senescent skin, there is an opening. No purulent drainage or malodor. On the left foot, the wound is again unchanged. There is some light blue staining on the callus, but no malodor or purulent drainage. 10/13; right and left heel remanence of extensive plantar foot wounds. These are better than I remember by quite a big margin however he is still left with wounds on the left plantar heel and the right plantar heel. Been using endoform bilaterally. A culture was done that showed apparently Pseudomonas but we are still waiting for the [facility] antibiotic to use gentamicin today. He is still very active by description I am not sure  about the offloading of his noncasted right MALAKI, KOURY ([id number]) [id number] Page 6 of 16 foot 10/20; both wounds right and left heel debrided not much change from last week. [employer] has arrived which is linezolid, gentamicin and ciprofloxacin we will use this with endoform. T contact cast on the left otal 06/02/2022: Both wounds are smaller today. There is still a fair amount of callus buildup around the right foot ulcer. The left is more superficial and nearly flush with the surrounding tissues. Also with slough and eschar buildup. 06/10/2022: The right sided wound appears to be nearly closed, if not completely so, although it is somewhat difficult to tell given the abnormal tissue and scarring in his foot. There is a fair amount of callus and crust accumulation. On the left, the wound looks about the same, again with callus and slough. He has an appointment next Thursday with Dr. Loel Lofty in podiatry; I am hopeful that there may be some reconstructive options available for Mr. Zerby. 06/16/2022: Both wounds have some eschar and callus accumulation. The right sided wound is extremely narrow and barely open; the left is narrower than last week. There is a little bit of slough. He has his appointment in podiatry later today. 06/23/2022: The patient met with Dr. Loel Lofty last week and unfortunately, there are no reconstructive options  that he believes would be helpful. He did order an MRI to evaluate for osteomyelitis and fortunately, none was seen. The left sided wound is a little bit shorter and narrower today. The right sided wound is about the same. There is callus and eschar accumulation bilaterally. 06/29/2022: Both feet have improved from last week. There is epithelium making a valiant effort to creep across the surface on the left. The right side looks like it got a little dry and the deep crevasse in his midfoot has cracked. Both have eschar and there  is some slough on the left. 07/07/2022: Both feet have improved. There is epithelium completely covering the calcaneus on the right with just a small opening in the crevasse in his midfoot. On the left, the open area of tissue is smaller but he continues to build up callus/eschar and slough. Electronic Signature(s) Signed: 07/07/2022 10:55:56 AM By: [name] Maudlin MD FACS Entered By: [name] on 07/07/2022 10:55:56 -------------------------------------------------------------------------------- Physical Exam Details Patient Name: Date of Service: [name], [name] 07/07/2022 9:30 A M Medical Record Number: [id number] Patient Account Number: [id number] Date of Birth/Sex: Treating RN: 1976/02/13 (46 y.o. M) Primary Care Provider: Cristie [name] Other Clinician: Referring Provider: Treating Provider/Extender: [name] in Treatment: 103 Constitutional . . . . No acute distress. Respiratory Normal work of breathing on room air. Notes 07/07/2022: Both feet have improved. There is epithelium completely covering the calcaneus on the right with just a small opening in the crevasse in his midfoot. On the left, the open area of tissue is smaller but he continues to build up callus/eschar and slough. Electronic Signature(s) Signed: 07/07/2022 11:40:22 AM By: [name] Maudlin MD FACS Previous Signature: 07/07/2022 11:22:31 AM Version By: [name] Maudlin MD FACS Entered By: [name] on 07/07/2022 11:40:22 -------------------------------------------------------------------------------- Physician Orders Details Patient Name: Date of Service: [name], [place] 07/07/2022 9:30 A M Medical Record Number: [id number] Patient Account Number: [id number] Date of Birth/Sex: Treating RN: Oct 29, 1975 (46 y.o. [name] Primary Care Provider: Cristie [name] Other Clinician: Referring Provider: Treating Provider/Extender: [name] in  Treatment: [id number] Verbal / Phone Orders: No [name], [name] ([id number]) [id number] Page 7 of 16 Diagnosis Coding ICD-10 Coding Code Description L97.512 Non-pressure chronic ulcer of other part of right foot with fat layer exposed L97.522 Non-pressure chronic ulcer of other part of left foot with fat layer exposed Follow-up Appointments ppointment in 1 week. - Dr. Celine [name] Room 3 Return A Anesthetic Wound #2 Left Calcaneus (In clinic) Topical Lidocaine 4% applied to wound bed - In clinic Bathing/ Shower/ Hygiene May shower with protection but do not get wound dressing(s) wet. - Cover with cast protector (can purchase cast protector at [facility] or [facility] (Cast on hold 06/23/22) Edema Control - Lymphedema / SCD / Other Bilateral Lower Extremities Elevate legs to the level of the heart or above for 30 minutes daily and/or when sitting, a frequency of: - throughout the day Avoid standing for long periods of time. Moisturize legs daily. - as needed Off-Loading Other: - keep pressure off of the bottom of your feet. Globoped to left foot-when not wearing the cast Elevate legs throughout the day Additional Orders / Instructions Follow Nutritious Diet Wound Treatment Wound #1 - Calcaneus Wound Laterality: Right Cleanser: Normal Saline (Generic) Every Other Day/30 Days Discharge Instructions: Cleanse the wound with Normal Saline prior to applying a clean dressing using gauze sponges, not tissue or cotton balls. Cleanser: Wound Cleanser (Generic) Every Other  Day/30 Days Discharge Instructions: Cleanse the wound with wound cleanser prior to applying a clean dressing using gauze sponges, not tissue or cotton balls. Prim Dressing: Promogran Prisma Matrix, 4.34 (sq in) (silver collagen) Every Other Day/30 Days ary Discharge Instructions: Moisten collagen with saline or hydrogel Secondary Dressing: ALLEVYN Heel 4 1/2in x 5 1/2in / 10.5cm x 13.5cm (Generic) Every Other Day/30  Days Discharge Instructions: Apply over primary dressing as directed. Secondary Dressing: Woven Gauze Sponge, Non-Sterile 4x4 in Every Other Day/30 Days Discharge Instructions: Apply over primary dressing as directed. Secured With: Child psychotherapist, Sterile 2x75 (in/in) (Generic) Every Other Day/30 Days Discharge Instructions: Secure with stretch gauze as directed. Secured With: 66M Medipore H Soft Cloth Surgical T ape, 4 x 10 (in/yd) (Generic) Every Other Day/30 Days Discharge Instructions: Secure with tape as directed. Wound #2 - Calcaneus Wound Laterality: Left Cleanser: Normal Saline (Generic) 1 x Per Week/30 Days Discharge Instructions: Cleanse the wound with Normal Saline prior to applying a clean dressing using gauze sponges, not tissue or cotton balls. Cleanser: Wound Cleanser 1 x Per Week/30 Days Discharge Instructions: Cleanse the wound with wound cleanser prior to applying a clean dressing using gauze sponges, not tissue or cotton balls. Topical: keystone 1 x Per Week/30 Days Discharge Instructions: use on Left Calcaneus Prim Dressing: Endoform 2x2 in 1 x Per Week/30 Days ary Discharge Instructions: Moisten with saline or Hydrogel. Put on after appt. with [facility]11/9/23 Secondary Dressing: Optifoam Non-Adhesive Dressing, 4x4 in (Generic) 1 x Per Week/30 Days Discharge Instructions: Apply over primary dressing as directed. Secondary Dressing: Woven Gauze Sponge, Non-Sterile 4x4 in 1 x Per Week/30 Days Discharge Instructions: Apply over primary dressing as directed. Secured With: 66M Medipore H Soft Cloth Surgical T ape, 4 x 10 (in/yd) (Generic) 1 x Per Week/30 Days Discharge Instructions: Secure with tape as directed. [name], [name] ([id number]) [id number] Page 8 of 16 Electronic Signature(s) Signed: 07/07/2022 12:45:53 PM By: [name] Maudlin MD FACS Entered By: [name] on 07/07/2022  11:40:42 -------------------------------------------------------------------------------- Problem List Details Patient Name: Date of Service: [name], [place] 07/07/2022 9:30 A M Medical Record Number: [id number] Patient Account Number: [id number] Date of Birth/Sex: Treating RN: June 29, 1976 (46 y.o. M) Primary Care Provider: Cristie [name] Other Clinician: Referring Provider: Treating Provider/Extender: [name] in Treatment: [phone] Active Problems ICD-10 Encounter Code Description Active Date MDM Diagnosis L97.512 Non-pressure chronic ulcer of other part of right foot with fat layer exposed 09/03/2020 No Yes L97.522 Non-pressure chronic ulcer of other part of left foot with fat layer exposed 09/03/2020 No Yes Inactive Problems ICD-10 Code Description Active Date Inactive Date L89.893 Pressure ulcer of other site, stage 3 07/15/2020 07/15/2020 M62.81 Muscle weakness (generalized) 07/15/2020 07/15/2020 I10 Essential (primary) hypertension 07/15/2020 07/15/2020 M86.171 Other acute osteomyelitis, right ankle and foot 09/03/2020 09/03/2020 Resolved Problems Electronic Signature(s) Signed: 07/07/2022 10:52:14 AM By: [name] Maudlin MD FACS Entered By: [name] on 07/07/2022 10:52:14 -------------------------------------------------------------------------------- Progress Note Details Patient Name: Date of Service: [name], [place] 07/07/2022 9:30 A M Medical Record Number: [id number] Patient Account Number: [id number] Date of Birth/Sex: Treating RN: 1976-01-09 (46 y.o. [employer] ([id number]) [id number] Page 9 of 16 Primary Care Provider: Cristie [name] Other Clinician: Referring Provider: Treating Provider/Extender: [name] in Treatment: 103 Subjective Chief Complaint Information obtained from Patient Bilateral Plantar Foot Ulcers History of Present Illness (HPI) Wounds are12/03/2020 upon  evaluation today patient presents for initial inspection here in our clinic concerning  issues he has been having with the bottoms of his feet bilaterally. He states these actually occurred as wounds when he was hospitalized for 5 months secondary to Covid. He was apparently with tilting bed where he was in an upright position quite frequently and apparently this occurred in some way shape or form during that time. Fortunately there is no sign of active infection at this time. No fevers, chills, nausea, vomiting, or diarrhea. With that being said he still has substantial wounds on the plantar aspects of his feet Theragen require quite a bit of work to get these to heal. He has been using Santyl currently though that is been problematic both in receiving the medication as well as actually paid for it as it is become quite expensive. Prior to the experience with Covid the patient really did not have any major medical problems other than hypertension he does have some mild generalized weakness following the Covid experience. 07/22/2020 on evaluation today patient appears to be doing okay in regard to his foot ulcers I feel like the wound beds are showing signs of better improvement that I do believe the Iodoflex is helping in this regard. With that being said he does have a lot of drainage currently and this is somewhat blue/green in nature which is consistent with Pseudomonas. I do think a culture today would be appropriate for [place] to evaluate and see if that is indeed the case I would likely start him on antibiotic orally as well he is not allergic to Cipro knows of no issues he has had in the past 12/21; patient was admitted to the clinic earlier this month with bilateral presumed pressure ulcers on the bottom of his feet apparently related to excessive pressure from a tilt table arrangement in the intensive care unit. Patient relates this to being on ECMO but I am not really sure that is exactly related  to that. I must say I have never seen anything like this. He has fairly extensive full-thickness wounds extending from his heel towards his midfoot mostly centered laterally. There is already been some healing distally. He does not appear to have an arterial issue. He has been using gentamicin to the wound surfaces with Iodoflex to help with ongoing debridement 1/6; this is a patient with pressure ulcers on the bottom of his feet related to excessive pressure from a standing position in the intensive care unit. He is complaining of a lot of pain in the right heel. He is not a diabetic. 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 We have been using Iodoflex to help prepare the surfaces of both wounds for an advanced treatment product. He is nonambulatory spending most of his time in a wheelchair I have asked him not to propel the wheelchair with his heels 1/13; in general his wounds look better not much surface area change we have been using Iodoflex as of last week. I did an x-ray of the right heel as the patient was complaining of pain. I had some thoughts about a stress fracture perhaps Achilles tendon problems however what it showed was erosive changes along the inferior aspect of the calcaneus he now has a MRI booked for 1/20. 1/20; in general his wounds continue to be better. Some improvement in the large narrow areas proximally in his foot. He is still complaining of pain in the right heel and tenderness in certain areas of this wound. His MRI is tonight. I am not just looking for osteomyelitis  that was brought up on the x-ray I am wondering about stress fractures, tendon ruptures etc. He has no such findings on the left. Also noteworthy is that the patient had critical illness neuropathy and some of the discomfort may be actual improvement in nerve function I am just not sure. These wounds were initially in the setting of severe critical illness related to COVID-19. He was  put in a standing position. He may have also been on pressors at the point contributing to tissue ischemia. By his description at some point these wounds were grossly necrotic extending proximally up into the Achilles part of his heel. I do not know that I have ever really seen pictures of them like this although they may exist in epic We have ordered Tri layer Oasis. I am trying to stimulate some granulation in these areas. This is of course assuming the MRI is negative for infection 1/27; since the patient was last here he saw Dr. Juleen China of infectious disease. He is planned for vancomycin and ceftriaxone. Prior operative culture grew MSSA. Also ordered baseline lab work. He also ordered arterial studies although the ABIs in our clinic were normal as well as his clinical exam these were normal I do not think he needs to see vascular surgery. His ABIs at the [facility] were 1.22 in the right triphasic waveforms with a normal TBI of 1.15 on the left ABI of 1.22 with triphasic waveforms and a normal TBI of 1.08. Finally he saw Dr. Amalia Hailey who will follow him in for 2 months. At this point I do not think he felt that he needed a procedure on the right calcaneal bone. Dr. Juleen China is elected for broad-spectrum antibiotic The patient is still having pain in the right heel. He walks with a walker 2/3; wounds are generally smaller. He is tolerating his IV antibiotics. I believe this is vancomycin and ceftriaxone. We are still waiting for Oasis burn in terms of his out-of-pocket max which he should be meeting soon given the IV antibiotics, MRIs etc. I have asked him to check in on this. We are using silver collagen in the meantime the wounds look better 2/10; tolerating IV vancomycin and Rocephin. We are waiting to apply for Oasis. Although I am not really sure where he is in his out-of-pocket max. 2/17 started the first application of Oasis trilayer. Still on antibiotics. The wounds have generally look better. The  area on the left has a little more surface slough requiring debridement 5/57; second application of Oasis trilayer. The wound surface granulation is generally look better. The area on the left with undermining laterally I think is come in a bit. 10/08/2020 upon evaluation today patient is here today for [employer] application #3. Fortunately he seems to be doing extremely well with regard to this and we are seeing a lot of new epithelial growth which is great news. Fortunately there is no signs of active infection at this time. 10/16/2020 upon evaluation today patient appears to be doing well with regard to his foot ulcers. Do believe the Oasis has been of benefit for him. I do not see any signs of infection right now which is great news and I think that he has a lot of new epithelial growth which is great to see as well. The patient is very pleased to hear all of this. I do think we can proceed with the Oasis trilayer #4 today. 3/18; not as much improvement in these areas on his heels that I  was hoping. I did reapply trilateral Oasis today the tissue looks healthier but not as much fill in as I was hoping. 3/25; better looking today I think this is come in a bit the tissue looks healthier. Triple layer Oasis reapplied #6 4/1; somewhat better looking definitely better looking surface not as much change in surface area as I was hoping. He may be spending more time [place] on days then he needs to although he does have heel offloading boots. Triple layer Oasis reapplied #7 4/7; unfortunately apparently [facility] will not approve any further Oasis which is unfortunate since the patient did respond nicely both in terms of the condition of the wound bed as well as surface area. There is still some drainage coming from the wound but not a lot there does not appear to be any infection 4/15; we have been using Hydrofera Blue. He continues to have nice rims of epithelialization on the right  greater than the left. The left the epithelialization is coming from the tip of his heel. There is moderate drainage. In this that concerns me about a total contact cast. There is no evidence of infection 4/29; patient has been using Hydrofera Blue with dressing changes. He has no complaints or issues today. 5/5; using Hydrofera Blue. I actually think that he looks marginally better than the last time I saw this 3 weeks ago. There are rims of epithelialization on the left thumb coming from the medial side on the right. Using Hydrofera Blue 5/12; using Hydrofera Blue. These continue to make improvements in surface area. His drainage was not listed as severe I therefore went ahead and put a BENTLEY, FISSEL ([id number]) [id number] Page 10 of 16 cast on the left foot. Right foot we will continue to dress his previous 5/16; back for first total contact cast change. He did not tolerate this particularly well cast injury on the anterior tibia among other issues. Difficulty sleeping. I talked him about this in some detail and afterwards is elected to continue. I told him I would like to have a cast on for 3 weeks to see if this is going to help at all. I think he agreed 5/19; I think the wound is better. There is no tunneling towards his midfoot. The undermining medially also looks better. He has a rim of new skin distally. I think we are making progress here. The area on the left also continues to look somewhat better to me using Hydrofera Blue. He has a list of complaints about the cast but none of them seem serious 5/26; patient presents for 1 week follow-up. He has been using a total contact cast and tolerating this well. Hydrofera Blue is the main dressing used. He denies signs of infection. 6/2 Hydrofera Blue total contact cast on the left. These were large ulcers that formed in intensive care unit where the patient was recovering from [place]. May have had something to do  with being ventilated in an upright positiono Pressors etc. We have been able to get the areas down considerably and a viable surface. There is some epithelialization in both sides. Note made of drainage 6/9; changed to [facility] last time because of drainage. He arrives with better looking surfaces and dimensions on the left than the right. Paradoxically the right actually probes more towards his midfoot the left is largely close down but both of these look improved. Using a total contact cast on the left 6/16; complex wounds on his bilateral plantar  heels which were initially pressure injury from a stay in the ICU with COVID. We have been using silver alginate most recently. His dimensions of come in quite dramatically however not recently. We have been putting the left foot in a total contact cast 6/23; complex wounds on the bilateral plantar heels. I been putting the left in the cast paradoxically the area on the right is the one that is going towards closure at a faster rate. Quite a bit of drainage on the left. The patient went to see Dr. Amalia Hailey who said he was going to standby for skin grafts. I had actually considered sending him for skin grafts however he would be mandatorily off his feet for a period of weeks to months. I am thinking that the area on the right is going to close on its own the area on the left has been more stubborn even though we have him in a total contact cast 6/30; took him out of a total contact cast last week is the right heel seem to be making better progress than the left where I was placing the cast. We are using silver alginate. Both wounds are smaller right greater than left 7/12; both wounds look as though they are making some progress. We are using silver alginate. Heel offloading boots 7/26; very gradual progress especially on the right. Using silver alginate. He is wearing heel offloading boots 8/18; he continues to close these wounds down very gradually.  Using silver alginate. The problem polymen being definitive about this is areas of what appears to be callus around the margins. This is not a 100% of the area but certainly sizable especially on the right 9/1; bilateral plantar feet wounds secondary to prolonged pressure while being ventilated for COVID-19 in an upright position. Essentially pressure ulcers on the bottom of his feet. He is made substantial progress using silver alginate. 9/14; bilateral plantar feet wounds secondary to prolonged pressure. Making progress using silver alginate. 9/29 bilateral plantar feet wounds secondary to prolonged pressure. I changed him to Iodoflex last week. MolecuLight showing reddened blush fluorescence 10/11; patient presents for follow-up. He has no issues or complaints today. He denies signs of infection. He continues to use Iodoflex and antibiotic ointment to the wound beds. 10/27; 2-week follow-up. No evidence of infection. He has callus and thick dry skin around the wound margins we have been using Iodoflex and Bactroban which was in response to a moderate left MolecuLight reddish blush fluorescence. 11/10; 2-week follow-up. Wound margins again have thick callus however the measurements of the actual wound sites are a lot smaller. Everything looks reasonably healthy here. We have been using Iodoflex He was approved for prime matrix but I have elected to delay this given the improvement in the surface area. Hopefully I will not regret that decision as were getting close to the end of the year in terms of insurance payment 12/8; 2-week follow-up. Wounds are generally smaller in size. These were initially substantial wounds extending into the forefoot all the way into the heel on the bilateral plantar feet. They are now both located on the plantar heel distal aspect both of these have a lot of callus around the wounds I used a #5 curette to remove this on the right and the left also some subcutaneous  debris to try and get the wound edges were using Iodoflex. He has heel offloading shoe 12/22; 2-week follow-up. Not really much improvement. He has thick callus around the outer edges of both wounds.  I remove this there is some nonviable subcutaneous tissue as well. We have been using Iodoflex. Her intake nurse and myself spontaneously thought of a total contact cast I went back in May. At that time we really were not seeing much of an improvement with a cast although the wound was in a much different situation I would like to retry this in 2 weeks and I discussed this with the patient 08/12/2021; the patient has had some improvement with the Iodoflex. The the area on the left heel plantar more improved than the right. I had to put him in a total contact cast on the left although I decided to put that off for 2 weeks. I am going to change his primary dressing to silver collagen. I think in both areas he has had some improvement most of the healing seems to be more proximal in the heel. The wounds are in the mid aspect. A lot of thick callus on the right heel however. 1/19; we are using silver collagen on both plantar heel areas. He has had some improvement today. The left did not require any debridement. He still had some eschar on the right that was debrided but both seem to have contracted. I did not put it total contact cast on him today 2/2 we have been using silver collagen. The area on the right plantar heel has areas that appear to be epithelialized interspersed with dry flaking callus and dry skin. I removed this. This really looks better than on the other side. On the left still a large area with raised edges and debris on the surface. 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 He has no issues or complaints today. 2/9; not much change in the left foot wound with 1  week of a cast we are using silver collagen. Silver collagen on the right side. The right side has been the better wound surface. We will reapply the total contact cast on the left 2/16; not much improvement on either side I been using silver collagen with a total contact cast on the left. I'm changing the Hydrofera Blue still with a total contact cast on the left 2/23; some improvement on both sides. Disappointing that he has thick callus around the area that we are putting in a total contact cast on the left. We've been using Hydrofera Blue on both wound areas. This is a man who at essentially pressure ulcers in addition to ischemia caused by medications to support his blood pressure (pressors) in the ICU. He was being ventilated in the standing position for severe Covid. A Shiley the wounds extended across his entire foot but are now localized to his plantar heels bilaterally. We have made progress however neither areas healed. I continue to think the total contact cast is helped albeit painstakingly slowly. He has never wanted a plastic surgery consult although I don't know that they would be interested in grafting in area in this location. 10/07/2021: Continued improvement bilaterally. There is still some callus around the left wound, despite the total contact cast. He has some increased pain in his right midfoot around 1 particular area. This has been painful in the past but seems to be a little bit worse. When his cast was removed today, there was an area on the heel of the left foot that looks a bit macerated. He is  also complaining of pain in his left thigh and hip which he thinks is secondary to the limb length discrepancy caused by the cast. 10/14/2021: He continues to improve. A little bit less callus around the left wound. He continues to endorse pain in his right midfoot, but this is not as significant as it was last week. The maceration on his left heel is improved. 10/21/2021: Continued  improvement to both wounds. The maceration on his left heel is no longer evident. Less callus bilaterally. Epithelialization progressing. 10/28/2021: Significant improvement this week. The right sided wound is nearly closed with just a small open area at the middle. No maceration seen on the left heel. Continued epithelialization on both sides. No concern for infection. 11/04/2021: T oday, the wounds were measured a little bit differently and come out as larger, but I actually think they are about the same to potentially even smaller, particularly on the left. He continues to accumulate some callus on the right. [name], [name] ([id number]) [id number] Page 11 of 16 11/11/2021: T oday, the patient is expressing some concern that the left wound, despite being in the total contact cast, is not progressing at the same rate as the 1 on the right. He is interested in trying a week without the cast to see how the wound does. The wounds are roughly the same size as last week, with the right perhaps being a little bit smaller. He continues to build up callus on both sites. 11/18/2021: Last week, I permitted the patient to go without his total contact cast, just to see if the cast was really making any difference. Today, both wounds have deteriorated to some extent, suggesting that the cast is providing benefit, at least on the left. Both are larger and have accumulated callus, slough, and other debris. 11/26/2021: I debrided both wounds quite aggressively last week in an effort to stimulate the healing cascade. This appears to have been effective as the left sided wound is a full centimeter shorter in length. Although the right was measured slightly larger, on inspection, it looks as though an area of epithelialized tissue was included in the measurements. We have been using PolyMem Ag on the wound surfaces with a total contact cast to the left. 12/02/2021: It appears that the intake  personnel are including epithelialized tissue in his wound measurements; the right wound is almost completely epithelialized; there is just a crater at the proximal midfoot with some open areas. On the left, he has built up some callus, but the overall wound surface looks good. There is some senescent skin around the wound margin. He has been in PolyMem Ag bilaterally with a total contact cast on the left. 12/09/2021: The right wound is nearly closed; there is just a small open area at the mid calcaneus. On the left, the wound is smaller with minimal callus buildup. No significant drainage. 12/16/2021: The right calcaneal wound remains minimally open at the mid calcaneus; the rest has epithelialized. On the left, the wound is also a little bit smaller. There is some senescent tissue on the periphery. He is getting his first application of a trial skin substitute called Vendaje today. 12/23/2021: The wound on his right calcaneus is nearly closed; there is just a small area at the most distal aspect of the calcaneus that is open. On the left, the area where we applied to the skin substitute has a healthier-looking bed of granulation tissue. The wound dimensions are not significantly different on this side but the  wound surface is improved. 12/30/2021: The wound on the right calcaneus has not changed significantly aside from some accumulation of callus. On the left, the open area is smaller and continues to have an improved surface. He continues to accumulate callus around the wound. He is here for his third application of Vendaje. 01/06/2022: The right calcaneal wound is down to just a couple of millimeters. He continues to accumulate periwound callus. He unfortunately got his cast wet earlier in the week and his left foot is macerated, resulting in some superficial skin loss just distal to the open wound. The open wound itself, however, is much smaller and has a healthier appearing surface. He is here for his  fourth application of Vendaje. 01/13/2022: The right calcaneal wound is about the same. Unfortunately, once again, his cast got wet and his foot is again macerated. This is caused the left calcaneal wound to enlarge. He is here for his fifth application of Vendaje. 01/20/2022: The right calcaneal wound is very small. There is some periwound callus accumulation. He purchased a new cast protector last week and this has been effective in avoiding the maceration that has been occurring on the left. The left calcaneal wound is narrower and has a healthy and viable-appearing surface. He is here for his 6 application of Vendaje. 01/27/2022: The right calcaneal wound is down to just a pinhole. There is some periwound slough and callus. On the left, the wound is narrower and shorter by about a centimeter. The surface is robust and viable-appearing. Unfortunately, the rep for the trial skin substitute product did not provide any for [place] to use today. 02/04/2022: The right calcaneal wound remains unchanged. There is more accumulated callus. On the left, although the intake nurse measured it a little bit longer, it looks about the same to me. The surface has a layer of slough, but underneath this, there is good granulation tissue. 02/10/2022: The right calcaneus wound is nearly closed. There is still some callus that builds up around the site. The left side looks about the same in terms of dimensions, but the surface is more robust and vital-appearing. 02/16/2022: The area of the right calcaneus that was nearly closed last week has closed, but there is a small opening at the mid foot where it looks like some moisture got retained and caused some reopening. The left foot wound is narrower and shallower. Both sites have a fair amount of periwound callus and eschar. 02/24/2022: The small midfoot opening on the right calcaneus is a little bit smaller today. The left foot wound is narrower and shallower. He continues  to accumulate periwound callus. No concern for infection. 03/01/2022: The patient came to clinic early because he showered and got his cast wet. Fortunately, there is no significant maceration to his foot but the callus softened and it looks like the wound on his left calcaneus may be a little bit wider. The wound on his right calcaneus is just a narrow slit. Continued accumulation of periwound callus bilaterally. 03/08/2022: The wound on his right calcaneus is very nearly closed, just a small pinpoint opening under a bit of eschar; the left wound has come in quite a bit, as well. It is narrower and shorter than at our last visit. Still with accumulated callus and eschar bilaterally. 03/17/2022: The right calcaneal wound is healed. The left wound is smaller and the surface itself is very clean, but there is some blue-green staining on the periwound callus, concerning for Pseudomonas aeruginosa. 03/23/2022: The right  calcaneal wound remains closed. The left wound continues to contract. No further blue-green staining. Small amount of callus and slough accumulation. 03/28/2022: He came in early today because he had gotten his cast wet. On inspection, the wound itself did not get wet or macerated, just a little bit of the forefoot. The wound itself is basically unchanged. 04/07/2022: The right foot wound remains closed. The left wound is the smallest that I have seen it to date. It is narrower and shorter. It still continues to accumulate slough on the surface. 04/15/2022: There is a band of epithelium now dividing the small left plantar foot wound in 2. There is still some slough on the surface. 04/21/2022: The wound continues to narrow. Just a little bit of slough on the surface. He seems to be responding well to endoform. 04/28/2022: Continued slow contraction of the wound. There is a little slough on the surface and some periwound callus. We have been using endoform and total contact cast. 05/05/2022: The  wound appears to have stalled. There is slough and some periwound eschar/callus. No concern for infection, however. 05/12/2022: Unfortunately, his right foot has reopened. It is located at the most posterior aspect of his surgical incision. The area was noted to have drainage coming from it when his padding was removed today. Underneath some callus and senescent skin, there is an opening. No purulent drainage or malodor. On the left foot, the wound is again unchanged. There is some light blue staining on the callus, but no malodor or purulent drainage. 10/13; right and left heel remanence of extensive plantar foot wounds. These are better than I remember by quite a big margin however he is still left with wounds on the left plantar heel and the right plantar heel. Been using endoform bilaterally. A culture was done that showed apparently Pseudomonas but we are still waiting for the [facility] antibiotic to use gentamicin today. He is still very active by description I am not sure about the offloading of his noncasted right foot [name], [name] ([id number]) [id number] Page 12 of 16 10/20; both wounds right and left heel debrided not much change from last week. [employer] has arrived which is linezolid, gentamicin and ciprofloxacin we will use this with endoform. T contact cast on the left otal 06/02/2022: Both wounds are smaller today. There is still a fair amount of callus buildup around the right foot ulcer. The left is more superficial and nearly flush with the surrounding tissues. Also with slough and eschar buildup. 06/10/2022: The right sided wound appears to be nearly closed, if not completely so, although it is somewhat difficult to tell given the abnormal tissue and scarring in his foot. There is a fair amount of callus and crust accumulation. On the left, the wound looks about the same, again with callus and slough. He has an appointment next Thursday with Dr.  Loel Lofty in podiatry; I am hopeful that there may be some reconstructive options available for Mr. Bisono. 06/16/2022: Both wounds have some eschar and callus accumulation. The right sided wound is extremely narrow and barely open; the left is narrower than last week. There is a little bit of slough. He has his appointment in podiatry later today. 06/23/2022: The patient met with Dr. Loel Lofty last week and unfortunately, there are no reconstructive options that he believes would be helpful. He did order an MRI to evaluate for osteomyelitis and fortunately, none was seen. The left sided wound is a little bit shorter and narrower today. The right sided  wound is about the same. There is callus and eschar accumulation bilaterally. 06/29/2022: Both feet have improved from last week. There is epithelium making a valiant effort to creep across the surface on the left. The right side looks like it got a little dry and the deep crevasse in his midfoot has cracked. Both have eschar and there is some slough on the left. 07/07/2022: Both feet have improved. There is epithelium completely covering the calcaneus on the right with just a small opening in the crevasse in his midfoot. On the left, the open area of tissue is smaller but he continues to build up callus/eschar and slough. Patient History Information obtained from Patient. Family History Cancer - Maternal Grandparents, Diabetes - Father,Paternal Grandparents, Heart Disease - Maternal Grandparents, Hypertension - Father,Paternal Grandparents, Lung Disease - Siblings, No family history of Hereditary Spherocytosis, Kidney Disease, Seizures, Stroke, Thyroid Problems, Tuberculosis. Social History Never smoker, Marital Status - Married, Alcohol Use - Never, Drug Use - No History, Caffeine Use - Daily - tea, soda. Medical History Eyes Denies history of Cataracts, Glaucoma, Optic Neuritis Ear/Nose/Mouth/Throat Denies history of Chronic sinus  problems/congestion, Middle ear problems Hematologic/Lymphatic Denies history of Anemia, Hemophilia, Human Immunodeficiency Virus, Lymphedema, Sickle Cell Disease Respiratory Patient has history of Asthma Denies history of Aspiration, Chronic Obstructive Pulmonary Disease (COPD), Pneumothorax, Sleep Apnea, Tuberculosis Cardiovascular Patient has history of Angina - with COVID, Hypertension Denies history of Arrhythmia, Congestive Heart Failure, Coronary Artery Disease, Deep Vein Thrombosis, Hypotension, Myocardial Infarction, Peripheral Arterial Disease, Peripheral Venous Disease, Phlebitis, Vasculitis Gastrointestinal Denies history of Cirrhosis , Colitis, Crohnoos, Hepatitis A, Hepatitis B, Hepatitis C Endocrine Denies history of Type I Diabetes, Type II Diabetes Genitourinary Denies history of End Stage Renal Disease Immunological Denies history of Lupus Erythematosus, Raynaudoos, Scleroderma Integumentary (Skin) Denies history of History of Burn Musculoskeletal Denies history of Gout, Rheumatoid Arthritis, Osteoarthritis, Osteomyelitis Neurologic Denies history of Dementia, Neuropathy, Quadriplegia, Paraplegia, Seizure Disorder Oncologic Denies history of Received Chemotherapy, Received Radiation Psychiatric Denies history of Anorexia/bulimia, Confinement Anxiety Hospitalization/Surgery History - COVID PNA 07/22/2019- 11/14/2019. - 03/27/2020 wound debridement/ skin graft. Medical A Surgical History Notes nd Constitutional Symptoms (General Health) COVID PNA 07/22/2019-11/14/2019 VENT ECMO, foot drop left foot , Genitourinary kidney stone Psychiatric anxiety Objective Constitutional [name], DIEPPA ([id number]) [id number] Page 13 of 16 No acute distress. Vitals Time Taken: 9:59 AM, Height: 69 in, Weight: 280 lbs, BMI: 41.3, Temperature: 98.3 F, Pulse: 82 bpm, Respiratory Rate: 16 breaths/min, Blood Pressure: 138/84  mmHg. Respiratory Normal work of breathing on room air. General Notes: 07/07/2022: Both feet have improved. There is epithelium completely covering the calcaneus on the right with just a small opening in the crevasse in his midfoot. On the left, the open area of tissue is smaller but he continues to build up callus/eschar and slough. Integumentary (Hair, Skin) Wound #1 status is Open. Original cause of wound was Pressure Injury. The date acquired was: 10/07/2019. The wound has been in treatment 103 weeks. The wound is located on the Right Calcaneus. The wound measures 0.3cm length x 0.3cm width x 0.2cm depth; 0.071cm^2 area and 0.014cm^3 volume. There is Fat Layer (Subcutaneous Tissue) exposed. There is no tunneling or undermining noted. There is a medium amount of serosanguineous drainage noted. The wound margin is thickened. There is medium (34-66%) pink granulation within the wound bed. There is a medium (34-66%) amount of necrotic tissue within the wound bed including Eschar and Adherent Slough. The periwound skin appearance had no abnormalities noted for moisture. The periwound  skin appearance had no abnormalities noted for color. The periwound skin appearance exhibited: Callus. Periwound temperature was noted as No Abnormality. Wound #2 status is Open. Original cause of wound was Pressure Injury. The date acquired was: 10/07/2019. The wound has been in treatment 103 weeks. The wound is located on the Left Calcaneus. The wound measures 1.9cm length x 0.4cm width x 0.2cm depth; 0.597cm^2 area and 0.119cm^3 volume. There is Fat Layer (Subcutaneous Tissue) exposed. There is no tunneling or undermining noted. There is a medium amount of serosanguineous drainage noted. The wound margin is thickened. There is large (67-100%) red, pink granulation within the wound bed. There is a small (1-33%) amount of necrotic tissue within the wound bed including Eschar and Adherent Slough. The periwound skin appearance  had no abnormalities noted for color. The periwound skin appearance exhibited: Callus, Scarring, Maceration. Periwound temperature was noted as No Abnormality. Assessment Active Problems ICD-10 Non-pressure chronic ulcer of other part of right foot with fat layer exposed Non-pressure chronic ulcer of other part of left foot with fat layer exposed Procedures Wound #1 Pre-procedure diagnosis of Wound #1 is a Pressure Ulcer located on the Right Calcaneus . There was a Selective/Open Wound Non-Viable Tissue Debridement with a total area of 0.5 sq cm performed by [name] Maudlin, MD. With the following instrument(s): Curette to remove Non-Viable tissue/material. Material removed includes Eschar and Callus and after achieving pain control using Lidocaine 5% topical ointment. No specimens were taken. A time out was conducted at 10:09, prior to the start of the procedure. A Minimum amount of bleeding was controlled with Pressure. The procedure was tolerated well with a pain level of 0 throughout and a pain level of 0 following the procedure. Post Debridement Measurements: 1cm length x 0.5cm width x 0.2cm depth; 0.079cm^3 volume. Post debridement Stage noted as Category/Stage III. Character of Wound/Ulcer Post Debridement is improved. Post procedure Diagnosis Wound #1: Same as Pre-Procedure General Notes: Scribed for Dr. Celine [name] by J.Scotton. Wound #2 Pre-procedure diagnosis of Wound #2 is a Pressure Ulcer located on the Left Calcaneus . There was a Selective/Open Wound Non-Viable Tissue Debridement with a total area of 1.25 sq cm performed by [name] Maudlin, MD. With the following instrument(s): Curette to remove Non-Viable tissue/material. Material removed includes Eschar and Callus and after achieving pain control using Lidocaine 5% topical ointment. No specimens were taken. A time out was conducted at 10:09, prior to the start of the procedure. A Minimum amount of bleeding was controlled with  Pressure. The procedure was tolerated well with a pain level of 0 throughout and a pain level of 0 following the procedure. Post Debridement Measurements: 2.5cm length x 0.5cm width x 0.2cm depth; 0.196cm^3 volume. Post debridement Stage noted as Category/Stage III. Character of Wound/Ulcer Post Debridement is improved. Post procedure Diagnosis Wound #2: Same as Pre-Procedure General Notes: Scribed for Dr. Celine [name] by Lenna Sciara.S.. Plan Follow-up Appointments: Return Appointment in 1 week. - Dr. Celine [name] Room 3 Anesthetic: Wound #2 Left Calcaneus: (In clinic) Topical Lidocaine 4% applied to wound bed - In clinic Bathing/ Shower/ Hygiene: May shower with protection but do not get wound dressing(s) wet. - Cover with cast protector (can purchase cast protector at [facility] or [facility] (Cast on hold 06/23/22) Edema Control - Lymphedema / SCD / Other: Elevate legs to the level of the heart or above for 30 minutes daily and/or when sitting, a frequency of: - throughout the day Avoid standing for long periods of time. Moisturize legs daily. - as needed Off-Loading:  Other: - keep pressure off of the bottom of your feet. Globoped to left foot-when not wearing the cast Elevate legs throughout the day [name], [name] ([id number]) [id number] Page 14 of 16 Additional Orders / Instructions: Follow Nutritious Diet WOUND #1: - Calcaneus Wound Laterality: Right Cleanser: Normal Saline (Generic) Every Other Day/30 Days Discharge Instructions: Cleanse the wound with Normal Saline prior to applying a clean dressing using gauze sponges, not tissue or cotton balls. Cleanser: Wound Cleanser (Generic) Every Other Day/30 Days Discharge Instructions: Cleanse the wound with wound cleanser prior to applying a clean dressing using gauze sponges, not tissue or cotton balls. Prim Dressing: Promogran Prisma Matrix, 4.34 (sq in) (silver collagen) Every Other Day/30 Days ary Discharge Instructions: Moisten  collagen with saline or hydrogel Secondary Dressing: ALLEVYN Heel 4 1/2in x 5 1/2in / 10.5cm x 13.5cm (Generic) Every Other Day/30 Days Discharge Instructions: Apply over primary dressing as directed. Secondary Dressing: Woven Gauze Sponge, Non-Sterile 4x4 in Every Other Day/30 Days Discharge Instructions: Apply over primary dressing as directed. Secured With: Child psychotherapist, Sterile 2x75 (in/in) (Generic) Every Other Day/30 Days Discharge Instructions: Secure with stretch gauze as directed. Secured With: 59M Medipore H Soft Cloth Surgical T ape, 4 x 10 (in/yd) (Generic) Every Other Day/30 Days Discharge Instructions: Secure with tape as directed. WOUND #2: - Calcaneus Wound Laterality: Left Cleanser: Normal Saline (Generic) 1 x Per Week/30 Days Discharge Instructions: Cleanse the wound with Normal Saline prior to applying a clean dressing using gauze sponges, not tissue or cotton balls. Cleanser: Wound Cleanser 1 x Per Week/30 Days Discharge Instructions: Cleanse the wound with wound cleanser prior to applying a clean dressing using gauze sponges, not tissue or cotton balls. Topical: keystone 1 x Per Week/30 Days Discharge Instructions: use on Left Calcaneus Prim Dressing: Endoform 2x2 in 1 x Per Week/30 Days ary Discharge Instructions: Moisten with saline or Hydrogel. Put on after appt. with [facility]11/9/23 Secondary Dressing: Optifoam Non-Adhesive Dressing, 4x4 in (Generic) 1 x Per Week/30 Days Discharge Instructions: Apply over primary dressing as directed. Secondary Dressing: Woven Gauze Sponge, Non-Sterile 4x4 in 1 x Per Week/30 Days Discharge Instructions: Apply over primary dressing as directed. Secured With: 59M Medipore H Soft Cloth Surgical T ape, 4 x 10 (in/yd) (Generic) 1 x Per Week/30 Days Discharge Instructions: Secure with tape as directed. 07/07/2022: Both feet have improved. There is epithelium completely covering the calcaneus on the right with  just a small opening in the crevasse in his midfoot. On the left, the open area of tissue is smaller but he continues to build up callus/eschar and slough. I used a curette to debride callus, slough, and eschar from his feet. His wife mentioned that she is having a difficult time getting the endoform into the crevasse in his right foot so I am going to change that dressing to Prisma silver collagen and she will hopefully be able to pack it in better. We will continue [facility] compounded topical antibiotic to the left foot along with endoform. He will continue to offload both heels. I reminded him of the importance of elevating his feet and not just sitting in his wheelchair. Follow-up in 1 week. Electronic Signature(s) Signed: 07/07/2022 11:42:36 AM By: [name] Maudlin MD FACS Entered By: [name] on 07/07/2022 11:42:36 -------------------------------------------------------------------------------- HxROS Details Patient Name: Date of Service: [name], Lago 07/07/2022 9:30 A M Medical Record Number: [id number] Patient Account Number: [id number] Date of Birth/Sex: Treating RN: [birth year] (46 y.o. M) Primary Care Provider:  Cristie [name] Other Clinician: Referring Provider: Treating Provider/Extender: [name] in Treatment: 69 Information Obtained From Patient Constitutional Symptoms (General Health) Medical History: Past Medical History Notes: COVID PNA 07/22/2019-11/14/2019 [facility] ECMO, foot drop left foot , Eyes Medical History: Negative for: Cataracts; Glaucoma; Optic Neuritis Ear/Nose/Mouth/Throat TRENTAN, TRIPPE ([id number]) [id number] Page 15 of 16 Medical History: Negative for: Chronic sinus problems/congestion; Middle ear problems Hematologic/Lymphatic Medical History: Negative for: Anemia; Hemophilia; Human Immunodeficiency Virus; Lymphedema; Sickle Cell Disease Respiratory Medical History: Positive for:  Asthma Negative for: Aspiration; Chronic Obstructive Pulmonary Disease (COPD); Pneumothorax; Sleep Apnea; Tuberculosis Cardiovascular Medical History: Positive for: Angina - with COVID; Hypertension Negative for: Arrhythmia; Congestive Heart Failure; Coronary Artery Disease; Deep Vein Thrombosis; Hypotension; Myocardial Infarction; Peripheral Arterial Disease; Peripheral Venous Disease; Phlebitis; Vasculitis Gastrointestinal Medical History: Negative for: Cirrhosis ; Colitis; Crohns; Hepatitis A; Hepatitis B; Hepatitis C Endocrine Medical History: Negative for: Type I Diabetes; Type II Diabetes Genitourinary Medical History: Negative for: End Stage Renal Disease Past Medical History Notes: kidney stone Immunological Medical History: Negative for: Lupus Erythematosus; Raynauds; Scleroderma Integumentary (Skin) Medical History: Negative for: History of Burn Musculoskeletal Medical History: Negative for: Gout; Rheumatoid Arthritis; Osteoarthritis; Osteomyelitis Neurologic Medical History: Negative for: Dementia; Neuropathy; Quadriplegia; Paraplegia; Seizure Disorder Oncologic Medical History: Negative for: Received Chemotherapy; Received Radiation Psychiatric Medical History: Negative for: Anorexia/bulimia; Confinement Anxiety Past Medical History Notes: anxiety Immunizations Pneumococcal Vaccine: Received Pneumococcal Vaccination: No Implantable Devices None AHMARION, SARACENO ([id number]) [id number] Page 16 of 16 Hospitalization / Surgery History Type of Hospitalization/Surgery COVID PNA 07/22/2019- 11/14/2019 03/27/2020 wound debridement/ skin graft Family and Social History Cancer: Yes - Maternal Grandparents; Diabetes: Yes - Father,Paternal Grandparents; Heart Disease: Yes - Maternal Grandparents; Hereditary Spherocytosis: No; Hypertension: Yes - Father,Paternal Grandparents; Kidney Disease: No; Lung Disease: Yes - Siblings; Seizures: No;  Stroke: No; Thyroid Problems: No; Tuberculosis: No; Never smoker; Marital Status - Married; Alcohol Use: Never; Drug Use: No History; Caffeine Use: Daily - tea, soda; Financial Concerns: No; Food, Clothing or Shelter Needs: No; Support System Lacking: No; Transportation Concerns: No Electronic Signature(s) Signed: 07/07/2022 12:45:53 PM By: [name] Maudlin MD FACS Entered By: [name] on 07/07/2022 10:56:03 -------------------------------------------------------------------------------- SuperBill Details Patient Name: Date of Service: [name], Harrah. 07/07/2022 Medical Record Number: [id number] Patient Account Number: [id number] Date of Birth/Sex: Treating RN: [birth year] (46 y.o. M) Primary Care Provider: Cristie [name] Other Clinician: Referring Provider: Treating Provider/Extender: [name] in Treatment: 103 Diagnosis Coding ICD-10 Codes Code Description [phone] Non-pressure chronic ulcer of other part of right foot with fat layer exposed L97.522 Non-pressure chronic ulcer of other part of left foot with fat layer exposed Facility Procedures : CPT4 Code: 11572620 Description: 35597 - DEBRIDE WOUND 1ST 20 SQ CM OR < ICD-10 Diagnosis Description L97.512 Non-pressure chronic ulcer of other part of right foot with fat layer exposed L97.522 Non-pressure chronic ulcer of other part of left foot with fat layer exposed Modifier: Quantity: 1 Physician Procedures : CPT4 Code Description Modifier [id number] - WC PHYS LEVEL 4 - EST PT 25 ICD-10 Diagnosis Description L97.522 Non-pressure chronic ulcer of other part of left foot with fat layer exposed L97.512 Non-pressure chronic ulcer of other part of right foot  with fat layer exposed Quantity: 1 : [id number] - WC PHYS DEBR WO ANESTH 20 SQ CM ICD-10 Diagnosis Description L97.512 Non-pressure chronic ulcer of other part of right foot with fat layer exposed L97.522 Non-pressure chronic ulcer of other part  of left foot with fat  layer exposed Quantity: 1 Electronic Signature(s) Signed: 07/07/2022 11:42:52 AM By: [name] Maudlin MD FACS Entered By: [name] on 07/07/2022 11:42:51

## 2022-07-08 ENCOUNTER — Encounter: Payer: PPO | Admitting: Physical Medicine and Rehabilitation

## 2022-07-14 ENCOUNTER — Ambulatory Visit: Payer: PPO | Admitting: Podiatry

## 2022-07-15 ENCOUNTER — Encounter (HOSPITAL_BASED_OUTPATIENT_CLINIC_OR_DEPARTMENT_OTHER): Payer: PPO | Attending: General Surgery | Admitting: General Surgery

## 2022-07-15 DIAGNOSIS — L97512 Non-pressure chronic ulcer of other part of right foot with fat layer exposed: Secondary | ICD-10-CM | POA: Insufficient documentation

## 2022-07-15 DIAGNOSIS — L97522 Non-pressure chronic ulcer of other part of left foot with fat layer exposed: Secondary | ICD-10-CM | POA: Insufficient documentation

## 2022-07-15 DIAGNOSIS — L89623 Pressure ulcer of left heel, stage 3: Secondary | ICD-10-CM | POA: Diagnosis not present

## 2022-07-15 DIAGNOSIS — L89613 Pressure ulcer of right heel, stage 3: Secondary | ICD-10-CM | POA: Diagnosis not present

## 2022-07-16 NOTE — Progress Notes (Signed)
KAI, FURUKAWA ([id number]) [id number] Page 1 of 9 Visit Report for 07/15/2022 Arrival Information Details Patient Name: Date of Service: [name], TO [name] 07/15/2022 9:45 A M Medical Record Number: [id number] Patient Account Number: [id number] Date of Birth/Sex: Treating RN: [birth year] (46 y.o. [name] Primary Care Olusegun Gerstenberger: Dorinda Hill Other Clinician: Referring Andriea Hasegawa: Treating Fredi Geiler/Extender: Jana Hakim in Treatment: 104 Visit Information History Since Last Visit Added or deleted any medications: No Patient Arrived: Wheel Chair Any new allergies or adverse reactions: No Arrival Time: 09:52 Had a fall or experienced change in No Accompanied By: wife activities of daily living that may affect Transfer Assistance: None risk of falls: Patient Requires Transmission-Based Precautions: No Signs or symptoms of abuse/neglect since last visito No Patient Has Alerts: No Hospitalized since last visit: No Implantable device outside of the clinic excluding No cellular tissue based products placed in the center since last visit: Has Dressing in Place as Prescribed: Yes Pain Present Now: Yes Electronic Signature(s) Signed: 07/15/2022 [name]:21:08 PM By: [name] RN Entered By: [name] on 07/15/2022 09:53:01 -------------------------------------------------------------------------------- Encounter Discharge Information Details Patient Name: Date of Service: [name], [name] 07/15/2022 9:45 A M Medical Record Number: [id number] Patient Account Number: [id number] Date of Birth/Sex: Treating RN: 20-Jul-1976 (46 y.o. [name] Primary Care Jalexis Breed: Dorinda Hill Other Clinician: Referring Prospero Mahnke: Treating Rhett Mutschler/Extender: Jana Hakim in Treatment: [phone] Encounter Discharge Information Items Post Procedure Vitals Discharge Condition: Stable Temperature (F): 98.9 Ambulatory Status:  Wheelchair Pulse (bpm): 85 Discharge Destination: Home Respiratory Rate (breaths/min): 18 Transportation: Private Auto Blood Pressure (mmHg): 136/90 Accompanied By: self Schedule Follow-up Appointment: Yes Clinical Summary of Care: Electronic Signature(s) Signed: 07/15/2022 [name]:21:08 PM By: [name] RN Entered By: [name] on 07/15/2022 10:28:01 [name] ([id number]) [id number] Page 2 of 9 -------------------------------------------------------------------------------- Lower Extremity Assessment Details Patient Name: Date of Service: [name] 07/15/2022 9:45 A M Medical Record Number: [id number] Patient Account Number: [id number] Date of Birth/Sex: Treating RN: [birth year] (46 y.o. [name] Primary Care Tolulope Pinkett: Dorinda Hill Other Clinician: Referring Dyllan Kats: Treating Malli Falotico/Extender: Jana Hakim in Treatment: 104 Edema Assessment Assessed: [name]: No] Franne Forts: No] Edema: [Left: N] [Right: o] Calf Left: Right: Point of Measurement: 29 cm From Medial Instep 47 cm 44 cm Ankle Left: Right: Point of Measurement: 9 cm From Medial Instep 23 cm 25 cm Vascular Assessment Pulses: Dorsalis Pedis Palpable: [Left:Yes] [Right:Yes] Electronic Signature(s) Signed: 07/15/2022 [name]:21:08 PM By: [name] RN Entered By: [name] on 07/15/2022 09:54:59 -------------------------------------------------------------------------------- Multi Wound Chart Details Patient Name: Date of Service: [name], [name] 07/15/2022 9:45 A M Medical Record Number: [id number] Patient Account Number: [id number] Date of Birth/Sex: Treating RN: September 22, 1975 (46 y.o. M) Primary Care Annamae Shivley: Dorinda Hill Other Clinician: Referring Cathaleen Korol: Treating Ignazio Kincaid/Extender: Jana Hakim in Treatment: 104 Vital Signs Height(in): 69 Pulse(bpm): 85 Weight(lbs): 280 Blood Pressure(mmHg): 136/90 Body Mass  Index(BMI): 41.3 Temperature(F): 98.9 Respiratory Rate(breaths/min): 18 [1:Photos:] [N/A:N/A [id number] Page 3 of 9] Right Calcaneus Left Calcaneus N/A Wound Location: Pressure Injury Pressure Injury N/A Wounding Event: Pressure Ulcer Pressure Ulcer N/A Primary Etiology: Asthma, Angina, Hypertension Asthma, Angina, Hypertension N/A Comorbid History: 10/07/2019 10/07/2019 N/A Date Acquired: 104 104 N/A Weeks of Treatment: Open Open N/A Wound Status: No No N/A Wound Recurrence: 0.6x0.8x0.2 1.8x0.5x0.2 N/A Measurements L x W x D (cm) 0.377 0.707 N/A A (cm) : rea 0.075 0.141 N/A Volume (cm) : 98.80% 97.40% N/A %  Reduction in A rea: 99.80% 99.50% N/A % Reduction in Volume: Category/Stage III Category/Stage III N/A Classification: Medium Medium N/A Exudate A mount: Serosanguineous Serosanguineous N/A Exudate Type: red, brown red, brown N/A Exudate Color: Thickened Thickened N/A Wound Margin: Large (67-100%) Large (67-100%) N/A Granulation A mount: Pink Red, Pink N/A Granulation Quality: Small (1-33%) Small (1-33%) N/A Necrotic A mount: Eschar, Adherent Slough Eschar, Adherent Slough N/A Necrotic Tissue: Fat Layer (Subcutaneous Tissue): Yes Fat Layer (Subcutaneous Tissue): Yes N/A Exposed Structures: Fascia: No Fascia: No Tendon: No Tendon: No Muscle: No Muscle: No Joint: No Joint: No Bone: No Bone: No Large (67-100%) Medium (34-66%) N/A Epithelialization: Debridement - Selective/Open Wound Debridement - Selective/Open Wound N/A Debridement: Pre-procedure Verification/Time Out 10:05 10:05 N/A Taken: Lidocaine 5% topical ointment Lidocaine 5% topical ointment N/A Pain Control: Callus, [facility] N/A Tissue Debrided: Skin/Epidermis Non-Viable Tissue N/A Level: 0.48 0.9 N/A Debridement A (sq cm): rea Curette Curette N/A Instrument: Minimum Minimum N/A Bleeding: Pressure Pressure N/A Hemostasis A chieved: Procedure  was tolerated well Procedure was tolerated well N/A Debridement Treatment Response: 0.6x0.8x0.2 1.8x0.5x0.2 N/A Post Debridement Measurements L x W x D (cm) 0.075 0.141 N/A Post Debridement Volume: (cm) Category/Stage III Category/Stage III N/A Post Debridement Stage: Callus: Yes Callus: Yes N/A Periwound Skin Texture: Scarring: Yes No Abnormalities Noted Maceration: Yes N/A Periwound Skin Moisture: No Abnormalities Noted No Abnormalities Noted N/A Periwound Skin Color: No Abnormality No Abnormality N/A Temperature: Debridement Debridement N/A Procedures Performed: Treatment Notes Wound #1 (Calcaneus) Wound Laterality: Right Cleanser Normal Saline Discharge Instruction: Cleanse the wound with Normal Saline prior to applying a clean dressing using gauze sponges, not tissue or cotton balls. Wound Cleanser Discharge Instruction: Cleanse the wound with wound cleanser prior to applying a clean dressing using gauze sponges, not tissue or cotton balls. Peri-Wound Care Topical Primary Dressing Promogran Prisma Matrix, [name].34 (sq in) (silver collagen) Discharge Instruction: Moisten collagen with saline or hydrogel Secondary Dressing ALLEVYN Heel [name] 1/2in x 5 1/2in / 10.5cm x 13.5cm Discharge Instruction: Apply over primary dressing as directed. Woven Gauze Sponge, Non-Sterile 4x4 in Discharge Instruction: Apply over primary dressing as directed. Secured With Conforming Stretch Gauze Bandage, Sterile 2x75 (in/in) Discharge Instruction: Secure with stretch gauze as directed. [name], [name] x 10 (in/yd) Discharge Instruction: Secure with tape as directed. [name], [name] ([id number]) [id number] Page [name] of 9 Compression Wrap Compression Stockings Add-Ons Wound #2 (Calcaneus) Wound Laterality: Left Cleanser Normal Saline Discharge Instruction: Cleanse the wound with Normal Saline prior to applying a clean dressing using gauze  sponges, not tissue or cotton balls. Wound Cleanser Discharge Instruction: Cleanse the wound with wound cleanser prior to applying a clean dressing using gauze sponges, not tissue or cotton balls. Peri-Wound Care Topical keystone Discharge Instruction: use on Left Calcaneus Primary Dressing Endoform 2x2 in Discharge Instruction: Moisten with saline or Hydrogel. Put on after appt. with [facility]11/9/23 Secondary Dressing Optifoam Non-Adhesive Dressing, 4x4 in Discharge Instruction: Apply over primary dressing as directed. Woven Gauze Sponge, Non-Sterile 4x4 in Discharge Instruction: Apply over primary dressing as directed. Secured With [name], [name] x 10 (in/yd) Discharge Instruction: Secure with tape as directed. Compression Wrap Compression Stockings Add-Ons Electronic Signature(s) Signed: 07/15/2022 10:43:50 AM By: [name] MD FACS Entered By: [name] on 07/15/2022 10:43:50 -------------------------------------------------------------------------------- Multi-Disciplinary Care Plan Details Patient Name: Date of Service: [name], TO [name] 07/15/2022 9:45 A M Medical Record Number: [id number] Patient Account Number: [id number]  Date of Birth/Sex: Treating RN: February 14, 1976 (46 y.o. [name] Primary Care Avangeline Stockburger: Dorinda Hill Other Clinician: Referring Makyra Corprew: Treating Kiondra Caicedo/Extender: Jana Hakim in Treatment: 104 Multidisciplinary Care Plan reviewed with physician Active Inactive Wound/Skin Impairment Nursing Diagnoses: Impaired tissue integrity Knowledge deficit related to ulceration/compromised skin integrity AZLAAN, ISIDORE ([id number]) [id number] Page 5 of 9 Goals: Patient/caregiver will verbalize understanding of skin care regimen Date Initiated: 07/15/2020 Target Resolution Date: 03/08/2023 Goal Status: Active Ulcer/skin breakdown will have a volume  reduction of 30% by week [name] Date Initiated: 07/15/2020 Date Inactivated: 08/20/2020 Target Resolution Date: 09/03/2020 Goal Status: Unmet Unmet Reason: no major changes. Ulcer/skin breakdown will heal within 14 weeks Date Initiated: [name]/29/2022 Date Inactivated: 12/10/2020 Target Resolution Date: 12/10/2020 Unmet Reason: wounds still open at 14 Goal Status: Unmet weeks and today 21 weeks. Interventions: Assess patient/caregiver ability to obtain necessary supplies Assess patient/caregiver ability to perform ulcer/skin care regimen upon admission and as needed Assess ulceration(s) every visit Provide education on ulcer and skin care Treatment Activities: Skin care regimen initiated : 07/15/2020 Topical wound management initiated : 07/15/2020 Notes: Electronic Signature(s) Signed: 07/15/2022 [name]:21:08 PM By: [name] RN Entered By: [name] on 07/15/2022 09:58:17 -------------------------------------------------------------------------------- Pain Assessment Details Patient Name: Date of Service: [name], [name] 07/15/2022 9:45 A M Medical Record Number: [id number] Patient Account Number: [id number] Date of Birth/Sex: Treating RN: October 18, 1975 (46 y.o. [name] Primary Care Egan Sahlin: Dorinda Hill Other Clinician: Referring Nashika Coker: Treating Ghazal Pevey/Extender: Jana Hakim in Treatment: 104 Active Problems Location of Pain Severity and Description of Pain Patient Has Paino Yes Site Locations Rate the pain. Current Pain Level: [name] Pain Management and Medication Current Pain Management: Electronic Signature(s) Signed: 07/15/2022 [name]:21:08 PM By: [name] RN [name] ([id number]) [id number] Page 6 of 9 Entered By: [name] on 07/15/2022 09:53:32 -------------------------------------------------------------------------------- Patient/Caregiver Education Details Patient Name: Date of Service: [name]  12/8/2023andnbsp9:45 A M Medical Record Number: [id number] Patient Account Number: [id number] Date of Birth/Gender: Treating RN: 1976-04-03 (46 y.o. [name] Primary Care Physician: Dorinda Hill Other Clinician: Referring Physician: Treating Physician/Extender: Jana Hakim in Treatment: 104 Education Assessment Education Provided To: Patient Education Topics Provided Wound/Skin Impairment: Methods: Explain/Verbal Responses: Reinforcements needed, State content correctly [employer]) Signed: 07/15/2022 [name]:21:08 PM By: [name] RN Entered By: [name] on 07/15/2022 10:02:09 -------------------------------------------------------------------------------- Wound Assessment Details Patient Name: Date of Service: [name], TO [name] 07/15/2022 9:45 A M Medical Record Number: [id number] Patient Account Number: [id number] Date of Birth/Sex: Treating RN: Oct 27, 1975 (46 y.o. [name] Primary Care Dareld Mcauliffe: Dorinda Hill Other Clinician: Referring Iran Rowe: Treating Li Fragoso/Extender: Jana Hakim in Treatment: 104 Wound Status Wound Number: 1 Primary Etiology: Pressure Ulcer Wound Location: Right Calcaneus Wound Status: Open Wounding Event: Pressure Injury Comorbid History: Asthma, Angina, Hypertension Date Acquired: 10/07/2019 Weeks Of Treatment: 104 Clustered Wound: No Photos [name], [name] ([id number]) [id number] Page 7 of 9 Wound Measurements Length: (cm) 0.6 Width: (cm) 0.8 Depth: (cm) 0.2 Area: (cm) 0.377 Volume: (cm) 0.075 % Reduction in Area: 98.8% % Reduction in Volume: 99.8% Epithelialization: Large (67-100%) Tunneling: No Undermining: No Wound Description Classification: Category/Stage III Wound Margin: Thickened Exudate Amount: Medium Exudate Type: Serosanguineous Exudate Color: red, brown Foul Odor After Cleansing: No Slough/Fibrino Yes Wound  Bed Granulation Amount: Large (67-100%) Exposed Structure Granulation Quality: Pink Fascia Exposed: No Necrotic Amount: Small (1-33%) Fat Layer (Subcutaneous Tissue) Exposed: Yes Necrotic Quality: Eschar, Adherent  Slough Tendon Exposed: No Muscle Exposed: No Joint Exposed: No Bone Exposed: No Periwound Skin Texture Texture Color No Abnormalities Noted: No No Abnormalities Noted: Yes Callus: Yes Temperature / Pain Temperature: No Abnormality Moisture No Abnormalities Noted: Yes Treatment Notes Wound #1 (Calcaneus) Wound Laterality: Right Cleanser Normal Saline Discharge Instruction: Cleanse the wound with Normal Saline prior to applying a clean dressing using gauze sponges, not tissue or cotton balls. Wound Cleanser Discharge Instruction: Cleanse the wound with wound cleanser prior to applying a clean dressing using gauze sponges, not tissue or cotton balls. Peri-Wound Care Topical Primary Dressing Promogran Prisma Matrix, [name].34 (sq in) (silver collagen) Discharge Instruction: Moisten collagen with saline or hydrogel Secondary Dressing ALLEVYN Heel [name] 1/2in x 5 1/2in / 10.5cm x 13.5cm Discharge Instruction: Apply over primary dressing as directed. Woven Gauze Sponge, Non-Sterile 4x4 in Discharge Instruction: Apply over primary dressing as directed. Secured With Conforming Stretch Gauze Bandage, Sterile 2x75 (in/in) Discharge Instruction: Secure with stretch gauze as directed. 59M Medipore H Soft Cloth Surgical T [name], [name] x 10 (in/yd) Discharge Instruction: Secure with tape as directed. Compression Wrap Compression Stockings Add-Ons Electronic Signature(s) Signed: 07/15/2022 [name]:21:08 PM By: [name] RN Entered By: [name] on 07/15/2022 10:00:03 [name] ([id number]) [id number] Page 8 of 9 -------------------------------------------------------------------------------- Wound Assessment Details Patient Name: Date of Service: [name] Leach [name] 07/15/2022 9:45 A M Medical Record Number: [id number] Patient Account Number: [id number] Date of Birth/Sex: Treating RN: 08-27-1975 (46 y.o. [name] Primary Care Aarthi Uyeno: Dorinda Hill Other Clinician: Referring Briannon Boggio: Treating Nikitas Davtyan/Extender: Jana Hakim in Treatment: 104 Wound Status Wound Number: 2 Primary Etiology: Pressure Ulcer Wound Location: Left Calcaneus Wound Status: Open Wounding Event: Pressure Injury Comorbid History: Asthma, Angina, Hypertension Date Acquired: 10/07/2019 Weeks Of Treatment: 104 Clustered Wound: No Photos Wound Measurements Length: (cm) 1.8 Width: (cm) 0.5 Depth: (cm) 0.2 Area: (cm) 0. Volume: (cm) 0. % Reduction in Area: 97.[name]% % Reduction in Volume: 99.5% Epithelialization: Medium (34-66%) 707 Tunneling: No 141 Undermining: No Wound Description Classification: Category/Stage III Wound Margin: Thickened Exudate Amount: Medium Exudate Type: Serosanguineous Exudate Color: red, brown Foul Odor After Cleansing: No Slough/Fibrino Yes Wound Bed Granulation Amount: Large (67-100%) Exposed Structure Granulation Quality: Red, Pink Fascia Exposed: No Necrotic Amount: Small (1-33%) Fat Layer (Subcutaneous Tissue) Exposed: Yes Necrotic Quality: Eschar, Adherent Slough Tendon Exposed: No Muscle Exposed: No Joint Exposed: No Bone Exposed: No Periwound Skin Texture Texture Color No Abnormalities Noted: No No Abnormalities Noted: Yes Callus: Yes Temperature / Pain Scarring: Yes Temperature: No Abnormality Moisture No Abnormalities Noted: No Maceration: Yes Treatment Notes Wound #2 (Calcaneus) Wound Laterality: Left [name], [name] ([id number]) [id number] Page 9 of 9 Cleanser Normal Saline Discharge Instruction: Cleanse the wound with Normal Saline prior to applying a clean dressing using gauze sponges, not tissue or cotton balls. Wound Cleanser Discharge  Instruction: Cleanse the wound with wound cleanser prior to applying a clean dressing using gauze sponges, not tissue or cotton balls. Peri-Wound Care Topical keystone Discharge Instruction: use on Left Calcaneus Primary Dressing Endoform 2x2 in Discharge Instruction: Moisten with saline or Hydrogel. Put on after appt. with [facility]11/9/23 Secondary Dressing Optifoam Non-Adhesive Dressing, 4x4 in Discharge Instruction: Apply over primary dressing as directed. Woven Gauze Sponge, Non-Sterile 4x4 in Discharge Instruction: Apply over primary dressing as directed. Secured With 59M Medipore H Soft Cloth Surgical T [name], [name] x 10 (in/yd) Discharge Instruction: Secure with tape as directed. Compression Wrap Compression Stockings Add-Ons Electronic Signature(s) Signed:  07/15/2022 [name]:21:08 PM By: [name] RN Entered By: [name] on 07/15/2022 10:00:35 -------------------------------------------------------------------------------- Vitals Details Patient Name: Date of Service: [name], [name] 07/15/2022 9:45 A M Medical Record Number: [id number] Patient Account Number: [id number] Date of Birth/Sex: Treating RN: Aug 21, 1975 (46 y.o. [name] Primary Care Karrington Mccravy: Dorinda Hill Other Clinician: Referring Giavonni Cizek: Treating Cristy Colmenares/Extender: Jana Hakim in Treatment: 104 Vital Signs Time Taken: 09:53 Temperature (F): 98.9 Height (in): 69 Pulse (bpm): 85 Weight (lbs): 280 Respiratory Rate (breaths/min): 18 Body Mass Index (BMI): 41.3 Blood Pressure (mmHg): 136/90 Reference Range: 80 - 120 mg / dl Electronic Signature(s) Signed: 07/15/2022 [name]:21:08 PM By: [name] RN Entered By: [name] on 07/15/2022 09:53:17

## 2022-07-16 NOTE — Progress Notes (Signed)
[name], [name] ([id number]) [id number] Page 1 of 16 Visit Report for 07/15/2022 Chief Complaint Document Details Patient Name: Date of Service: [name], [place] 07/15/2022 9:45 A M Medical Record Number: [id number] Patient Account Number: [id number] Date of Birth/Sex: Treating RN: 1976/06/10 (46 y.o. M) Primary Care Provider: [name] Other Clinician: Referring Provider: Treating Provider/Extender: [name] in Treatment: 104 Information Obtained from: Patient Chief Complaint Bilateral Plantar Foot Ulcers Electronic Signature(s) Signed: 07/15/2022 10:43:58 AM By: Fredirick Maudlin MD FACS Entered By: Fredirick Maudlin on 07/15/2022 10:43:57 -------------------------------------------------------------------------------- Debridement Details Patient Name: Date of Service: [name], [place] 07/15/2022 9:45 A M Medical Record Number: [id number] Patient Account Number: [id number] Date of Birth/Sex: Treating RN: 1975/11/23 (46 y.o. Waldron Session Primary Care Provider: [name] Other Clinician: Referring Provider: Treating Provider/Extender: [name] in Treatment: 104 Debridement Performed for Assessment: Wound #1 Right Calcaneus Performed By: Physician Fredirick Maudlin, MD Debridement Type: Debridement Level of Consciousness (Pre-procedure): Awake and Alert Pre-procedure Verification/Time Out Yes - 10:05 Taken: Start Time: 10:06 Pain Control: Lidocaine 5% topical ointment T Area Debrided (L x W): otal 0.6 (cm) x 0.8 (cm) = 0.48 (cm) Tissue and other material debrided: Non-Viable, Callus, Slough, Skin: Epidermis, Slough Level: Skin/Epidermis Debridement Description: Selective/Open Wound Instrument: Curette Bleeding: Minimum Hemostasis Achieved: Pressure Response to Treatment: Procedure was tolerated well Level of Consciousness (Post- Awake and Alert procedure): Post Debridement Measurements  of Total Wound Length: (cm) 0.6 Stage: Category/Stage III Width: (cm) 0.8 Depth: (cm) 0.2 Volume: (cm) 0.075 Character of Wound/Ulcer Post Debridement: Requires Further Debridement Post Procedure Diagnosis Same as [name], [name] ([id number]) [id number] Page 2 of 16 Notes Scribed for Dr. [name] by Blanche East, RN Electronic Signature(s) Signed: 07/15/2022 12:03:59 PM By: Fredirick Maudlin MD FACS Signed: 07/15/2022 4:21:08 PM By: Blanche East RN Entered By: Blanche East on 07/15/2022 10:09:56 -------------------------------------------------------------------------------- Debridement Details Patient Name: Date of Service: [name], [place] 07/15/2022 9:45 A M Medical Record Number: [id number] Patient Account Number: [id number] Date of Birth/Sex: Treating RN: Sep 18, 1975 (46 y.o. Waldron Session Primary Care Provider: [name] Other Clinician: Referring Provider: Treating Provider/Extender: [name] in Treatment: 104 Debridement Performed for Assessment: Wound #2 Left Calcaneus Performed By: Physician Fredirick Maudlin, MD Debridement Type: Debridement Level of Consciousness (Pre-procedure): Awake and Alert Pre-procedure Verification/Time Out Yes - 10:05 Taken: Start Time: 10:06 Pain Control: Lidocaine 5% topical ointment T Area Debrided (L x W): otal 1.8 (cm) x 0.5 (cm) = 0.9 (cm) Tissue and other material debrided: Non-Viable, Slough, Slough Level: Non-Viable Tissue Debridement Description: Selective/Open Wound Instrument: Curette Bleeding: Minimum Hemostasis Achieved: Pressure Response to Treatment: Procedure was tolerated well Level of Consciousness (Post- Awake and Alert procedure): Post Debridement Measurements of Total Wound Length: (cm) 1.8 Stage: Category/Stage III Width: (cm) 0.5 Depth: (cm) 0.2 Volume: (cm) 0.141 Character of Wound/Ulcer Post Debridement: Requires Further  Debridement Post Procedure Diagnosis Same as Pre-procedure Notes Scribed for Dr. [name] by Blanche East, RN Electronic Signature(s) Signed: 07/15/2022 12:03:59 PM By: Fredirick Maudlin MD FACS Signed: 07/15/2022 4:21:08 PM By: Blanche East RN Entered By: Blanche East on 07/15/2022 10:10:45 -------------------------------------------------------------------------------- HPI Details Patient Name: Date of Service: [name], [place] 07/15/2022 9:45 A M Medical Record Number: [id number] Patient Account Number: [id number] Date of Birth/Sex: Treating RN: 09/05/1975 (46 y.o. M) Primary Care Provider: [name] Other Clinician: [name] ([id number]) [id number] Page 3 of 16 Referring Provider: Treating Provider/Extender: [name]  Weeks in Treatment: 104 History of Present Illness HPI Description: Wounds are12/03/2020 upon evaluation today patient presents for initial inspection here in our clinic concerning issues he has been having with the bottoms of his feet bilaterally. He states these actually occurred as wounds when he was hospitalized for 5 months secondary to Covid. He was apparently with tilting bed where he was in an upright position quite frequently and apparently this occurred in some way shape or form during that time. Fortunately there is no sign of active infection at this time. No fevers, chills, nausea, vomiting, or diarrhea. With that being said he still has substantial wounds on the plantar aspects of his feet Theragen require quite a bit of work to get these to heal. He has been using Santyl currently though that is been problematic both in receiving the medication as well as actually paid for it as it is become quite expensive. Prior to the experience with Covid the patient really did not have any major medical problems other than hypertension he does have some mild generalized weakness following the Covid  experience. 07/22/2020 on evaluation today patient appears to be doing okay in regard to his foot ulcers I feel like the wound beds are showing signs of better improvement that I do believe the Iodoflex is helping in this regard. With that being said he does have a lot of drainage currently and this is somewhat blue/green in nature which is consistent with Pseudomonas. I do think a culture today would be appropriate for [place] to evaluate and see if that is indeed the case I would likely start him on antibiotic orally as well he is not allergic to Cipro knows of no issues he has had in the past 12/21; patient was admitted to the clinic earlier this month with bilateral presumed pressure ulcers on the bottom of his feet apparently related to excessive pressure from a tilt table arrangement in the intensive care unit. Patient relates this to being on ECMO but I am not really sure that is exactly related to that. I must say I have never seen anything like this. He has fairly extensive full-thickness wounds extending from his heel towards his midfoot mostly centered laterally. There is already been some healing distally. He does not appear to have an arterial issue. He has been using gentamicin to the wound surfaces with Iodoflex to help with ongoing debridement 1/6; this is a patient with pressure ulcers on the bottom of his feet related to excessive pressure from a standing position in the intensive care unit. He is complaining of a lot of pain in the right heel. He is not a diabetic. 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 We have been using Iodoflex to help prepare the surfaces of both wounds for an advanced treatment product. He is nonambulatory spending most of his time in a wheelchair I have asked him not to propel the wheelchair with his heels 1/13; in general his wounds look better not much surface area change we have been using Iodoflex as of last week. I did an x-ray of  the right heel as the patient was complaining of pain. I had some thoughts about a stress fracture perhaps Achilles tendon problems however what it showed was erosive changes along the inferior aspect of the calcaneus he now has a MRI booked for 1/20. 1/20; in general his wounds continue to be better. Some improvement in the large narrow areas proximally in his foot. He is still  complaining of pain in the right heel and tenderness in certain areas of this wound. His MRI is tonight. I am not just looking for osteomyelitis that was brought up on the x-ray I am wondering about stress fractures, tendon ruptures etc. He has no such findings on the left. Also noteworthy is that the patient had critical illness neuropathy and some of the discomfort may be actual improvement in nerve function I am just not sure. These wounds were initially in the setting of severe critical illness related to COVID-19. He was put in a standing position. He may have also been on pressors at the point contributing to tissue ischemia. By his description at some point these wounds were grossly necrotic extending proximally up into the Achilles part of his heel. I do not know that I have ever really seen pictures of them like this although they may exist in epic We have ordered Tri layer Oasis. I am trying to stimulate some granulation in these areas. This is of course assuming the MRI is negative for infection 1/27; since the patient was last here he saw Dr. Juleen China of infectious disease. He is planned for vancomycin and ceftriaxone. Prior operative culture grew MSSA. Also ordered baseline lab work. He also ordered arterial studies although the ABIs in our clinic were normal as well as his clinical exam these were normal I do not think he needs to see vascular surgery. His ABIs at the [facility] were 1.22 in the right triphasic waveforms with a normal TBI of 1.15 on the left ABI of 1.22 with triphasic waveforms and a normal TBI of  1.08. Finally he saw Dr. Amalia Hailey who will follow him in for 2 months. At this point I do not think he felt that he needed a procedure on the right calcaneal bone. Dr. Juleen China is elected for broad-spectrum antibiotic The patient is still having pain in the right heel. He walks with a walker 2/3; wounds are generally smaller. He is tolerating his IV antibiotics. I believe this is vancomycin and ceftriaxone. We are still waiting for Oasis burn in terms of his out-of-pocket max which he should be meeting soon given the IV antibiotics, MRIs etc. I have asked him to check in on this. We are using silver collagen in the meantime the wounds look better 2/10; tolerating IV vancomycin and Rocephin. We are waiting to apply for Oasis. Although I am not really sure where he is in his out-of-pocket max. 2/17 started the first application of Oasis trilayer. Still on antibiotics. The wounds have generally look better. The area on the left has a little more surface slough requiring debridement 4/58; second application of Oasis trilayer. The wound surface granulation is generally look better. The area on the left with undermining laterally I think is come in a bit. 10/08/2020 upon evaluation today patient is here today for [employer] application #3. Fortunately he seems to be doing extremely well with regard to this and we are seeing a lot of new epithelial growth which is great news. Fortunately there is no signs of active infection at this time. 10/16/2020 upon evaluation today patient appears to be doing well with regard to his foot ulcers. Do believe the Oasis has been of benefit for him. I do not see any signs of infection right now which is great news and I think that he has a lot of new epithelial growth which is great to see as well. The patient is very pleased to hear all of this.  I do think we can proceed with the Oasis trilayer #4 today. 3/18; not as much improvement in these areas on his heels that I was  hoping. I did reapply trilateral Oasis today the tissue looks healthier but not as much fill in as I was hoping. 3/25; better looking today I think this is come in a bit the tissue looks healthier. Triple layer Oasis reapplied #6 4/1; somewhat better looking definitely better looking surface not as much change in surface area as I was hoping. He may be spending more time [place] on days then he needs to although he does have heel offloading boots. Triple layer Oasis reapplied #7 4/7; unfortunately apparently [facility] will not approve any further Oasis which is unfortunate since the patient did respond nicely both in terms of the condition of the wound bed as well as surface area. There is still some drainage coming from the wound but not a lot there does not appear to be any infection 4/15; we have been using Hydrofera Blue. He continues to have nice rims of epithelialization on the right greater than the left. The left the epithelialization is coming from the tip of his heel. There is moderate drainage. In this that concerns me about a total contact cast. There is no evidence of infection 4/29; patient has been using Hydrofera Blue with dressing changes. He has no complaints or issues today. 5/5; using Hydrofera Blue. I actually think that he looks marginally better than the last time I saw this 3 weeks ago. There are rims of epithelialization on the left thumb coming from the medial side on the right. Using Hydrofera Blue 5/12; using Hydrofera Blue. These continue to make improvements in surface area. His drainage was not listed as severe I therefore went ahead and put a cast on the left foot. Right foot we will continue to dress his previous 5/16; back for first total contact cast change. He did not tolerate this particularly well cast injury on the anterior tibia among other issues. Difficulty sleeping. I talked him about this in some detail and afterwards is elected to continue. I  told him I would like to have a cast on for 3 weeks to see if this is going to help at all. I think he agreed 5/19; I think the wound is better. There is no tunneling towards his midfoot. The undermining medially also looks better. He has a rim of new skin distally. I think we are making progress here. The area on the left also continues to look somewhat better to me using Hydrofera Blue. He has a list of complaints about the [name], [name] ([id number]) [id number] Page 4 of 16 cast but none of them seem serious 5/26; patient presents for 1 week follow-up. He has been using a total contact cast and tolerating this well. Hydrofera Blue is the main dressing used. He denies signs of infection. 6/2 Hydrofera Blue total contact cast on the left. These were large ulcers that formed in intensive care unit where the patient was recovering from [place]. May have had something to do with being ventilated in an upright positiono Pressors etc. We have been able to get the areas down considerably and a viable surface. There is some epithelialization in both sides. Note made of drainage 6/9; changed to [facility] last time because of drainage. He arrives with better looking surfaces and dimensions on the left than the right. Paradoxically the right actually probes more towards his midfoot the left  is largely close down but both of these look improved. Using a total contact cast on the left 6/16; complex wounds on his bilateral plantar heels which were initially pressure injury from a stay in the ICU with COVID. We have been using silver alginate most recently. His dimensions of come in quite dramatically however not recently. We have been putting the left foot in a total contact cast 6/23; complex wounds on the bilateral plantar heels. I been putting the left in the cast paradoxically the area on the right is the one that is going towards closure at a faster rate. Quite a bit of  drainage on the left. The patient went to see Dr. Amalia Hailey who said he was going to standby for skin grafts. I had actually considered sending him for skin grafts however he would be mandatorily off his feet for a period of weeks to months. I am thinking that the area on the right is going to close on its own the area on the left has been more stubborn even though we have him in a total contact cast 6/30; took him out of a total contact cast last week is the right heel seem to be making better progress than the left where I was placing the cast. We are using silver alginate. Both wounds are smaller right greater than left 7/12; both wounds look as though they are making some progress. We are using silver alginate. Heel offloading boots 7/26; very gradual progress especially on the right. Using silver alginate. He is wearing heel offloading boots 8/18; he continues to close these wounds down very gradually. Using silver alginate. The problem polymen being definitive about this is areas of what appears to be callus around the margins. This is not a 100% of the area but certainly sizable especially on the right 9/1; bilateral plantar feet wounds secondary to prolonged pressure while being ventilated for COVID-19 in an upright position. Essentially pressure ulcers on the bottom of his feet. He is made substantial progress using silver alginate. 9/14; bilateral plantar feet wounds secondary to prolonged pressure. Making progress using silver alginate. 9/29 bilateral plantar feet wounds secondary to prolonged pressure. I changed him to Iodoflex last week. MolecuLight showing reddened blush fluorescence 10/11; patient presents for follow-up. He has no issues or complaints today. He denies signs of infection. He continues to use Iodoflex and antibiotic ointment to the wound beds. 10/27; 2-week follow-up. No evidence of infection. He has callus and thick dry skin around the wound margins we have been using  Iodoflex and Bactroban which was in response to a moderate left MolecuLight reddish blush fluorescence. 11/10; 2-week follow-up. Wound margins again have thick callus however the measurements of the actual wound sites are a lot smaller. Everything looks reasonably healthy here. We have been using Iodoflex He was approved for prime matrix but I have elected to delay this given the improvement in the surface area. Hopefully I will not regret that decision as were getting close to the end of the year in terms of insurance payment 12/8; 2-week follow-up. Wounds are generally smaller in size. These were initially substantial wounds extending into the forefoot all the way into the heel on the bilateral plantar feet. They are now both located on the plantar heel distal aspect both of these have a lot of callus around the wounds I used a #5 curette to remove this on the right and the left also some subcutaneous debris to try and get the wound edges were  using Iodoflex. He has heel offloading shoe 12/22; 2-week follow-up. Not really much improvement. He has thick callus around the outer edges of both wounds. I remove this there is some nonviable subcutaneous tissue as well. We have been using Iodoflex. Her intake nurse and myself spontaneously thought of a total contact cast I went back in May. At that time we really were not seeing much of an improvement with a cast although the wound was in a much different situation I would like to retry this in 2 weeks and I discussed this with the patient 08/12/2021; the patient has had some improvement with the Iodoflex. The the area on the left heel plantar more improved than the right. I had to put him in a total contact cast on the left although I decided to put that off for 2 weeks. I am going to change his primary dressing to silver collagen. I think in both areas he has had some improvement most of the healing seems to be more proximal in the heel. The wounds are in  the mid aspect. A lot of thick callus on the right heel however. 1/19; we are using silver collagen on both plantar heel areas. He has had some improvement today. The left did not require any debridement. He still had some eschar on the right that was debrided but both seem to have contracted. I did not put it total contact cast on him today 2/2 we have been using silver collagen. The area on the right plantar heel has areas that appear to be epithelialized interspersed with dry flaking callus and dry skin. I removed this. This really looks better than on the other side. On the left still a large area with raised edges and debris on the surface. 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 He has no issues or complaints today. 2/9; not much change in the left foot wound with 1 week of a cast we are using silver collagen. Silver collagen on the right side. The right side has been the better wound surface. We will reapply the total contact cast on the left 2/16; not much improvement on either side I been using silver collagen with a total contact cast on the left. I'm changing the Hydrofera Blue still with a total contact cast on the left 2/23; some improvement on both sides. Disappointing that he has thick callus around the area that we are putting in a total contact cast on the left. We've been using Hydrofera Blue on both wound areas. This is a man who at essentially pressure ulcers in addition to ischemia caused by medications to support his blood pressure (pressors) in the ICU. He was being ventilated in the standing position for severe Covid. A [place] the wounds extended across his entire foot but are now localized to his plantar heels bilaterally. We have made progress however neither areas healed. I continue to think the total contact cast is helped albeit  painstakingly slowly. He has never wanted a plastic surgery consult although I don't know that they would be interested in grafting in area in this location. 10/07/2021: Continued improvement bilaterally. There is still some callus around the left wound, despite the total contact cast. He has some increased pain in his right midfoot around 1 particular area. This has been painful in the past but seems to be a little  bit worse. When his cast was removed today, there was an area on the heel of the left foot that looks a bit macerated. He is also complaining of pain in his left thigh and hip which he thinks is secondary to the limb length discrepancy caused by the cast. 10/14/2021: He continues to improve. A little bit less callus around the left wound. He continues to endorse pain in his right midfoot, but this is not as significant as it was last week. The maceration on his left heel is improved. 10/21/2021: Continued improvement to both wounds. The maceration on his left heel is no longer evident. Less callus bilaterally. Epithelialization progressing. 10/28/2021: Significant improvement this week. The right sided wound is nearly closed with just a small open area at the middle. No maceration seen on the left heel. Continued epithelialization on both sides. No concern for infection. 11/04/2021: T oday, the wounds were measured a little bit differently and come out as larger, but I actually think they are about the same to potentially even smaller, particularly on the left. He continues to accumulate some callus on the right. 11/11/2021: T oday, the patient is expressing some concern that the left wound, despite being in the total contact cast, is not progressing at the same rate as the 1 on the right. He is interested in trying a week without the cast to see how the wound does. The wounds are roughly the same size as last week, with the right perhaps being a little bit smaller. He continues to build up callus  on both sites. 11/18/2021: Last week, I permitted the patient to go without his total contact cast, just to see if the cast was really making any difference. Today, both wounds have deteriorated to some extent, suggesting that the cast is providing benefit, at least on the left. Both are larger and have accumulated callus, slough, and HASON, OFARRELL ([id number]) [id number] Page 5 of 16 other debris. 11/26/2021: I debrided both wounds quite aggressively last week in an effort to stimulate the healing cascade. This appears to have been effective as the left sided wound is a full centimeter shorter in length. Although the right was measured slightly larger, on inspection, it looks as though an area of epithelialized tissue was included in the measurements. We have been using PolyMem Ag on the wound surfaces with a total contact cast to the left. 12/02/2021: It appears that the intake personnel are including epithelialized tissue in his wound measurements; the right wound is almost completely epithelialized; there is just a crater at the proximal midfoot with some open areas. On the left, he has built up some callus, but the overall wound surface looks good. There is some senescent skin around the wound margin. He has been in [employer] bilaterally with a total contact cast on the left. 12/09/2021: The right wound is nearly closed; there is just a small open area at the mid calcaneus. On the left, the wound is smaller with minimal callus buildup. No significant drainage. 12/16/2021: The right calcaneal wound remains minimally open at the mid calcaneus; the rest has epithelialized. On the left, the wound is also a little bit smaller. There is some senescent tissue on the periphery. He is getting his first application of a trial skin substitute called Vendaje today. 12/23/2021: The wound on his right calcaneus is nearly closed; there is just a small area at the most distal aspect of  the calcaneus that is open. On the left, the area  where we applied to the skin substitute has a healthier-looking bed of granulation tissue. The wound dimensions are not significantly different on this side but the wound surface is improved. 12/30/2021: The wound on the right calcaneus has not changed significantly aside from some accumulation of callus. On the left, the open area is smaller and continues to have an improved surface. He continues to accumulate callus around the wound. He is here for his third application of Vendaje. 01/06/2022: The right calcaneal wound is down to just a couple of millimeters. He continues to accumulate periwound callus. He unfortunately got his cast wet earlier in the week and his left foot is macerated, resulting in some superficial skin loss just distal to the open wound. The open wound itself, however, is much smaller and has a healthier appearing surface. He is here for his fourth application of Vendaje. 01/13/2022: The right calcaneal wound is about the same. Unfortunately, once again, his cast got wet and his foot is again macerated. This is caused the left calcaneal wound to enlarge. He is here for his fifth application of Vendaje. 01/20/2022: The right calcaneal wound is very small. There is some periwound callus accumulation. He purchased a new cast protector last week and this has been effective in avoiding the maceration that has been occurring on the left. The left calcaneal wound is narrower and has a healthy and viable-appearing surface. He is here for his 6 application of Vendaje. 01/27/2022: The right calcaneal wound is down to just a pinhole. There is some periwound slough and callus. On the left, the wound is narrower and shorter by about a centimeter. The surface is robust and viable-appearing. Unfortunately, the rep for the trial skin substitute product did not provide any for [place] to use today. 02/04/2022: The right calcaneal wound remains unchanged.  There is more accumulated callus. On the left, although the intake nurse measured it a little bit longer, it looks about the same to me. The surface has a layer of slough, but underneath this, there is good granulation tissue. 02/10/2022: The right calcaneus wound is nearly closed. There is still some callus that builds up around the site. The left side looks about the same in terms of dimensions, but the surface is more robust and vital-appearing. 02/16/2022: The area of the right calcaneus that was nearly closed last week has closed, but there is a small opening at the mid foot where it looks like some moisture got retained and caused some reopening. The left foot wound is narrower and shallower. Both sites have a fair amount of periwound callus and eschar. 02/24/2022: The small midfoot opening on the right calcaneus is a little bit smaller today. The left foot wound is narrower and shallower. He continues to accumulate periwound callus. No concern for infection. 03/01/2022: The patient came to clinic early because he showered and got his cast wet. Fortunately, there is no significant maceration to his foot but the callus softened and it looks like the wound on his left calcaneus may be a little bit wider. The wound on his right calcaneus is just a narrow slit. Continued accumulation of periwound callus bilaterally. 03/08/2022: The wound on his right calcaneus is very nearly closed, just a small pinpoint opening under a bit of eschar; the left wound has come in quite a bit, as well. It is narrower and shorter than at our last visit. Still with accumulated callus and eschar bilaterally. 03/17/2022: The right calcaneal wound is healed. The left wound is  smaller and the surface itself is very clean, but there is some blue-green staining on the periwound callus, concerning for Pseudomonas aeruginosa. 03/23/2022: The right calcaneal wound remains closed. The left wound continues to contract. No further blue-green  staining. Small amount of callus and slough accumulation. 03/28/2022: He came in early today because he had gotten his cast wet. On inspection, the wound itself did not get wet or macerated, just a little bit of the forefoot. The wound itself is basically unchanged. 04/07/2022: The right foot wound remains closed. The left wound is the smallest that I have seen it to date. It is narrower and shorter. It still continues to accumulate slough on the surface. 04/15/2022: There is a band of epithelium now dividing the small left plantar foot wound in 2. There is still some slough on the surface. 04/21/2022: The wound continues to narrow. Just a little bit of slough on the surface. He seems to be responding well to endoform. 04/28/2022: Continued slow contraction of the wound. There is a little slough on the surface and some periwound callus. We have been using endoform and total contact cast. 05/05/2022: The wound appears to have stalled. There is slough and some periwound eschar/callus. No concern for infection, however. 05/12/2022: Unfortunately, his right foot has reopened. It is located at the most posterior aspect of his surgical incision. The area was noted to have drainage coming from it when his padding was removed today. Underneath some callus and senescent skin, there is an opening. No purulent drainage or malodor. On the left foot, the wound is again unchanged. There is some light blue staining on the callus, but no malodor or purulent drainage. 10/13; right and left heel remanence of extensive plantar foot wounds. These are better than I remember by quite a big margin however he is still left with wounds on the left plantar heel and the right plantar heel. Been using endoform bilaterally. A culture was done that showed apparently Pseudomonas but we are still waiting for the [facility] antibiotic to use gentamicin today. He is still very active by description I am not sure about the offloading of his  noncasted right foot 10/20; both wounds right and left heel debrided not much change from last week. [employer] has arrived which is linezolid, gentamicin and ciprofloxacin we will use this with endoform. T contact cast on the left otal 06/02/2022: Both wounds are smaller today. There is still a fair amount of callus buildup around the right foot ulcer. The left is more superficial and nearly flush with the surrounding tissues. Also with slough and eschar buildup. [name], [name] ([id number]) [id number] Page 6 of 16 06/10/2022: The right sided wound appears to be nearly closed, if not completely so, although it is somewhat difficult to tell given the abnormal tissue and scarring in his foot. There is a fair amount of callus and crust accumulation. On the left, the wound looks about the same, again with callus and slough. He has an appointment next Thursday with Dr. Loel Lofty in podiatry; I am hopeful that there may be some reconstructive options available for Mr. Meir. 06/16/2022: Both wounds have some eschar and callus accumulation. The right sided wound is extremely narrow and barely open; the left is narrower than last week. There is a little bit of slough. He has his appointment in podiatry later today. 06/23/2022: The patient met with Dr. Loel Lofty last week and unfortunately, there are no reconstructive options that he believes would be helpful. He did order an  MRI to evaluate for osteomyelitis and fortunately, none was seen. The left sided wound is a little bit shorter and narrower today. The right sided wound is about the same. There is callus and eschar accumulation bilaterally. 06/29/2022: Both feet have improved from last week. There is epithelium making a valiant effort to creep across the surface on the left. The right side looks like it got a little dry and the deep crevasse in his midfoot has cracked. Both have eschar and there is some slough on the  left. 07/07/2022: Both feet have improved. There is epithelium completely covering the calcaneus on the right with just a small opening in the crevasse in his midfoot. On the left, the open area of tissue is smaller but he continues to build up callus/eschar and slough. 07/15/2022: The opening in the midfoot on the right is about the same size, covered with eschar and a little bit of slough. The open portion of the left wound is narrower and shorter with a bit of slough buildup. He admits to being on his feet more than recommended. Electronic Signature(s) Signed: 07/15/2022 10:44:56 AM By: Fredirick Maudlin MD FACS Entered By: Fredirick Maudlin on 07/15/2022 10:44:56 -------------------------------------------------------------------------------- Physical Exam Details Patient Name: Date of Service: [name], [name] 07/15/2022 9:45 A M Medical Record Number: [id number] Patient Account Number: [id number] Date of Birth/Sex: Treating RN: 06/30/1976 (46 y.o. M) Primary Care Provider: [name] Other Clinician: Referring Provider: Treating Provider/Extender: [name] in Treatment: 104 Constitutional . . . . No acute distress. Respiratory Normal work of breathing on room air. Notes 07/15/2022: The opening in the midfoot on the right is about the same size, covered with eschar and a little bit of slough. The open portion of the left wound is narrower and shorter with a bit of slough buildup. Electronic Signature(s) Signed: 07/15/2022 10:46:32 AM By: Fredirick Maudlin MD FACS Entered By: Fredirick Maudlin on 07/15/2022 10:46:32 -------------------------------------------------------------------------------- Physician Orders Details Patient Name: Date of Service: [name], [place] 07/15/2022 9:45 A M Medical Record Number: [id number] Patient Account Number: [id number] Date of Birth/Sex: Treating RN: January 30, 1976 (46 y.o. Waldron Session Primary Care Provider: [name]  Other Clinician: Referring Provider: Treating Provider/Extender: [name] in Treatment: [phone] Verbal / Phone Orders: No Diagnosis Coding ICD-10 Coding Code Description DARIVS, LUNDEN ([id number]) [id number] Page 7 of 16 L97.512 Non-pressure chronic ulcer of other part of right foot with fat layer exposed L97.522 Non-pressure chronic ulcer of other part of left foot with fat layer exposed Follow-up Appointments ppointment in 1 week. - Dr. [name] Room 3 Return A Anesthetic Wound #2 Left Calcaneus (In clinic) Topical Lidocaine 4% applied to wound bed - In clinic Bathing/ Shower/ Hygiene May shower with protection but do not get wound dressing(s) wet. - Cover with cast protector (can purchase cast protector at [facility] or [facility] (Cast on hold 06/23/22) Edema Control - Lymphedema / SCD / Other Bilateral Lower Extremities Elevate legs to the level of the heart or above for 30 minutes daily and/or when sitting, a frequency of: - throughout the day Avoid standing for long periods of time. Moisturize legs daily. - as needed Off-Loading Other: - keep pressure off of the bottom of your feet. Globoped to left foot-when not wearing the cast Elevate legs throughout the day Additional Orders / Instructions Follow Nutritious Diet Wound Treatment Wound #1 - Calcaneus Wound Laterality: Right Cleanser: Normal Saline (Generic) Every Other Day/30 Days Discharge Instructions: Cleanse the  wound with Normal Saline prior to applying a clean dressing using gauze sponges, not tissue or cotton balls. Cleanser: Wound Cleanser (Generic) Every Other Day/30 Days Discharge Instructions: Cleanse the wound with wound cleanser prior to applying a clean dressing using gauze sponges, not tissue or cotton balls. Prim Dressing: Promogran Prisma Matrix, 4.34 (sq in) (silver collagen) Every Other Day/30 Days ary Discharge Instructions: Moisten collagen with saline or  hydrogel Secondary Dressing: ALLEVYN Heel 4 1/2in x 5 1/2in / 10.5cm x 13.5cm (Generic) Every Other Day/30 Days Discharge Instructions: Apply over primary dressing as directed. Secondary Dressing: Woven Gauze Sponge, Non-Sterile 4x4 in Every Other Day/30 Days Discharge Instructions: Apply over primary dressing as directed. Secured With: Child psychotherapist, Sterile 2x75 (in/in) (Generic) Every Other Day/30 Days Discharge Instructions: Secure with stretch gauze as directed. Secured With: 9M Medipore H Soft Cloth Surgical T ape, 4 x 10 (in/yd) (Generic) Every Other Day/30 Days Discharge Instructions: Secure with tape as directed. Wound #2 - Calcaneus Wound Laterality: Left Cleanser: Normal Saline (Generic) 1 x Per Week/30 Days Discharge Instructions: Cleanse the wound with Normal Saline prior to applying a clean dressing using gauze sponges, not tissue or cotton balls. Cleanser: Wound Cleanser 1 x Per Week/30 Days Discharge Instructions: Cleanse the wound with wound cleanser prior to applying a clean dressing using gauze sponges, not tissue or cotton balls. Topical: keystone 1 x Per Week/30 Days Discharge Instructions: use on Left Calcaneus Prim Dressing: Endoform 2x2 in 1 x Per Week/30 Days ary Discharge Instructions: Moisten with saline or Hydrogel. Put on after appt. with [facility]11/9/23 Secondary Dressing: Optifoam Non-Adhesive Dressing, 4x4 in (Generic) 1 x Per Week/30 Days Discharge Instructions: Apply over primary dressing as directed. Secondary Dressing: Woven Gauze Sponge, Non-Sterile 4x4 in 1 x Per Week/30 Days Discharge Instructions: Apply over primary dressing as directed. Secured With: 9M Medipore H Soft Cloth Surgical T ape, 4 x 10 (in/yd) (Generic) 1 x Per Week/30 Days Discharge Instructions: Secure with tape as directed. Electronic Signature(s) Signed: 07/15/2022 12:03:59 PM By: Fredirick Maudlin MD [place], TONY12/03/2022 12:03:59 PM By: Fredirick Maudlin MD FACS SignedJohnette Abraham ([id number]) [id number] Page 8 of 16 Entered By: Fredirick Maudlin on 07/15/2022 10:46:49 -------------------------------------------------------------------------------- Problem List Details Patient Name: Date of Service: [name] [name] 07/15/2022 9:45 A M Medical Record Number: [id number] Patient Account Number: [id number] Date of Birth/Sex: Treating RN: Oct 08, 1975 (46 y.o. M) Primary Care Provider: [name] Other Clinician: Referring Provider: Treating Provider/Extender: [name] in Treatment: 104 Active Problems ICD-10 Encounter Code Description Active Date MDM Diagnosis L97.512 Non-pressure chronic ulcer of other part of right foot with fat layer exposed 09/03/2020 No Yes L97.522 Non-pressure chronic ulcer of other part of left foot with fat layer exposed 09/03/2020 No Yes Inactive Problems ICD-10 Code Description Active Date Inactive Date L89.893 Pressure ulcer of other site, stage 3 07/15/2020 07/15/2020 M62.81 Muscle weakness (generalized) 07/15/2020 07/15/2020 I10 Essential (primary) hypertension 07/15/2020 07/15/2020 M86.171 Other acute osteomyelitis, right ankle and foot 09/03/2020 09/03/2020 Resolved Problems Electronic Signature(s) Signed: 07/15/2022 10:43:43 AM By: Fredirick Maudlin MD FACS Entered By: Fredirick Maudlin on 07/15/2022 10:43:42 -------------------------------------------------------------------------------- Progress Note Details Patient Name: Date of Service: [name], [place] 07/15/2022 9:45 A M Medical Record Number: [id number] Patient Account Number: [id number] Date of Birth/Sex: Treating RN: Feb 23, 1976 (46 y.o. M) Primary Care Provider: [name] Other Clinician: Referring Provider: Treating Provider/Extender: [name] in Treatment: [address] ([id number]) [id number] Page 9 of 16  Subjective Chief  Complaint Information obtained from Patient Bilateral Plantar Foot Ulcers History of Present Illness (HPI) Wounds are12/03/2020 upon evaluation today patient presents for initial inspection here in our clinic concerning issues he has been having with the bottoms of his feet bilaterally. He states these actually occurred as wounds when he was hospitalized for 5 months secondary to Covid. He was apparently with tilting bed where he was in an upright position quite frequently and apparently this occurred in some way shape or form during that time. Fortunately there is no sign of active infection at this time. No fevers, chills, nausea, vomiting, or diarrhea. With that being said he still has substantial wounds on the plantar aspects of his feet Theragen require quite a bit of work to get these to heal. He has been using Santyl currently though that is been problematic both in receiving the medication as well as actually paid for it as it is become quite expensive. Prior to the experience with Covid the patient really did not have any major medical problems other than hypertension he does have some mild generalized weakness following the Covid experience. 07/22/2020 on evaluation today patient appears to be doing okay in regard to his foot ulcers I feel like the wound beds are showing signs of better improvement that I do believe the Iodoflex is helping in this regard. With that being said he does have a lot of drainage currently and this is somewhat blue/green in nature which is consistent with Pseudomonas. I do think a culture today would be appropriate for [place] to evaluate and see if that is indeed the case I would likely start him on antibiotic orally as well he is not allergic to Cipro knows of no issues he has had in the past 12/21; patient was admitted to the clinic earlier this month with bilateral presumed pressure ulcers on the bottom of his feet apparently related to excessive pressure from a  tilt table arrangement in the intensive care unit. Patient relates this to being on ECMO but I am not really sure that is exactly related to that. I must say I have never seen anything like this. He has fairly extensive full-thickness wounds extending from his heel towards his midfoot mostly centered laterally. There is already been some healing distally. He does not appear to have an arterial issue. He has been using gentamicin to the wound surfaces with Iodoflex to help with ongoing debridement 1/6; this is a patient with pressure ulcers on the bottom of his feet related to excessive pressure from a standing position in the intensive care unit. He is complaining of a lot of pain in the right heel. He is not a diabetic. 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 We have been using Iodoflex to help prepare the surfaces of both wounds for an advanced treatment product. He is nonambulatory spending most of his time in a wheelchair I have asked him not to propel the wheelchair with his heels 1/13; in general his wounds look better not much surface area change we have been using Iodoflex as of last week. I did an x-ray of the right heel as the patient was complaining of pain. I had some thoughts about a stress fracture perhaps Achilles tendon problems however what it showed was erosive changes along the inferior aspect of the calcaneus he now has a MRI booked for 1/20. 1/20; in general his wounds continue to be better. Some improvement in the large narrow areas proximally  in his foot. He is still complaining of pain in the right heel and tenderness in certain areas of this wound. His MRI is tonight. I am not just looking for osteomyelitis that was brought up on the x-ray I am wondering about stress fractures, tendon ruptures etc. He has no such findings on the left. Also noteworthy is that the patient had critical illness neuropathy and some of the discomfort may be actual  improvement in nerve function I am just not sure. These wounds were initially in the setting of severe critical illness related to COVID-19. He was put in a standing position. He may have also been on pressors at the point contributing to tissue ischemia. By his description at some point these wounds were grossly necrotic extending proximally up into the Achilles part of his heel. I do not know that I have ever really seen pictures of them like this although they may exist in epic We have ordered Tri layer Oasis. I am trying to stimulate some granulation in these areas. This is of course assuming the MRI is negative for infection 1/27; since the patient was last here he saw Dr. Juleen China of infectious disease. He is planned for vancomycin and ceftriaxone. Prior operative culture grew MSSA. Also ordered baseline lab work. He also ordered arterial studies although the ABIs in our clinic were normal as well as his clinical exam these were normal I do not think he needs to see vascular surgery. His ABIs at the P[facility] were 1.22 in the right triphasic waveforms with a normal TBI of 1.15 on the left ABI of 1.22 with triphasic waveforms and a normal TBI of 1.08. Finally he saw Dr. Amalia Hailey who will follow him in for 2 months. At this point I do not think he felt that he needed a procedure on the right calcaneal bone. Dr. Juleen China is elected for broad-spectrum antibiotic The patient is still having pain in the right heel. He walks with a walker 2/3; wounds are generally smaller. He is tolerating his IV antibiotics. I believe this is vancomycin and ceftriaxone. We are still waiting for Oasis burn in terms of his out-of-pocket max which he should be meeting soon given the IV antibiotics, MRIs etc. I have asked him to check in on this. We are using silver collagen in the meantime the wounds look better 2/10; tolerating IV vancomycin and Rocephin. We are waiting to apply for Oasis. Although I am not really sure where he  is in his out-of-pocket max. 2/17 started the first application of Oasis trilayer. Still on antibiotics. The wounds have generally look better. The area on the left has a little more surface slough requiring debridement 8/31; second application of Oasis trilayer. The wound surface granulation is generally look better. The area on the left with undermining laterally I think is come in a bit. 10/08/2020 upon evaluation today patient is here today for [employer] application #3. Fortunately he seems to be doing extremely well with regard to this and we are seeing a lot of new epithelial growth which is great news. Fortunately there is no signs of active infection at this time. 10/16/2020 upon evaluation today patient appears to be doing well with regard to his foot ulcers. Do believe the Oasis has been of benefit for him. I do not see any signs of infection right now which is great news and I think that he has a lot of new epithelial growth which is great to see as well. The patient is  very pleased to hear all of this. I do think we can proceed with the Oasis trilayer #4 today. 3/18; not as much improvement in these areas on his heels that I was hoping. I did reapply trilateral Oasis today the tissue looks healthier but not as much fill in as I was hoping. 3/25; better looking today I think this is come in a bit the tissue looks healthier. Triple layer Oasis reapplied #6 4/1; somewhat better looking definitely better looking surface not as much change in surface area as I was hoping. He may be spending more time [place] on days then he needs to although he does have heel offloading boots. Triple layer Oasis reapplied #7 4/7; unfortunately apparently [facility] will not approve any further Oasis which is unfortunate since the patient did respond nicely both in terms of the condition of the wound bed as well as surface area. There is still some drainage coming from the wound but not a lot there  does not appear to be any infection 4/15; we have been using Hydrofera Blue. He continues to have nice rims of epithelialization on the right greater than the left. The left the epithelialization is coming from the tip of his heel. There is moderate drainage. In this that concerns me about a total contact cast. There is no evidence of infection 4/29; patient has been using Hydrofera Blue with dressing changes. He has no complaints or issues today. 5/5; using Hydrofera Blue. I actually think that he looks marginally better than the last time I saw this 3 weeks ago. There are rims of epithelialization on the left thumb coming from the medial side on the right. Using Hydrofera Blue 5/12; using Hydrofera Blue. These continue to make improvements in surface area. His drainage was not listed as severe I therefore went ahead and put a cast on the left foot. Right foot we will continue to dress his previous 5/16; back for first total contact cast change. He did not tolerate this particularly well cast injury on the anterior tibia among other issues. Difficulty sleeping. I talked him about this in some detail and afterwards is elected to continue. I told him I would like to have a cast on for 3 weeks to see if this is going to help at all. I think he agreed 5/19; I think the wound is better. There is no tunneling towards his midfoot. The undermining medially also looks better. He has a rim of new skin distally. I think [name], [name] ([id number]) [id number] Page 10 of 16 we are making progress here. The area on the left also continues to look somewhat better to me using Hydrofera Blue. He has a list of complaints about the cast but none of them seem serious 5/26; patient presents for 1 week follow-up. He has been using a total contact cast and tolerating this well. Hydrofera Blue is the main dressing used. He denies signs of infection. 6/2 Hydrofera Blue total contact cast on  the left. These were large ulcers that formed in intensive care unit where the patient was recovering from [place]. May have had something to do with being ventilated in an upright positiono Pressors etc. We have been able to get the areas down considerably and a viable surface. There is some epithelialization in both sides. Note made of drainage 6/9; changed to [place] last time because of drainage. He arrives with better looking surfaces and dimensions on the left than the right. Paradoxically the right actually  probes more towards his midfoot the left is largely close down but both of these look improved. Using a total contact cast on the left 6/16; complex wounds on his bilateral plantar heels which were initially pressure injury from a stay in the ICU with COVID. We have been using silver alginate most recently. His dimensions of come in quite dramatically however not recently. We have been putting the left foot in a total contact cast 6/23; complex wounds on the bilateral plantar heels. I been putting the left in the cast paradoxically the area on the right is the one that is going towards closure at a faster rate. Quite a bit of drainage on the left. The patient went to see Dr. Amalia Hailey who said he was going to standby for skin grafts. I had actually considered sending him for skin grafts however he would be mandatorily off his feet for a period of weeks to months. I am thinking that the area on the right is going to close on its own the area on the left has been more stubborn even though we have him in a total contact cast 6/30; took him out of a total contact cast last week is the right heel seem to be making better progress than the left where I was placing the cast. We are using silver alginate. Both wounds are smaller right greater than left 7/12; both wounds look as though they are making some progress. We are using silver alginate. Heel offloading boots 7/26; very gradual progress  especially on the right. Using silver alginate. He is wearing heel offloading boots 8/18; he continues to close these wounds down very gradually. Using silver alginate. The problem polymen being definitive about this is areas of what appears to be callus around the margins. This is not a 100% of the area but certainly sizable especially on the right 9/1; bilateral plantar feet wounds secondary to prolonged pressure while being ventilated for COVID-19 in an upright position. Essentially pressure ulcers on the bottom of his feet. He is made substantial progress using silver alginate. 9/14; bilateral plantar feet wounds secondary to prolonged pressure. Making progress using silver alginate. 9/29 bilateral plantar feet wounds secondary to prolonged pressure. I changed him to Iodoflex last week. MolecuLight showing reddened blush fluorescence 10/11; patient presents for follow-up. He has no issues or complaints today. He denies signs of infection. He continues to use Iodoflex and antibiotic ointment to the wound beds. 10/27; 2-week follow-up. No evidence of infection. He has callus and thick dry skin around the wound margins we have been using Iodoflex and Bactroban which was in response to a moderate left MolecuLight reddish blush fluorescence. 11/10; 2-week follow-up. Wound margins again have thick callus however the measurements of the actual wound sites are a lot smaller. Everything looks reasonably healthy here. We have been using Iodoflex He was approved for prime matrix but I have elected to delay this given the improvement in the surface area. Hopefully I will not regret that decision as were getting close to the end of the year in terms of insurance payment 12/8; 2-week follow-up. Wounds are generally smaller in size. These were initially substantial wounds extending into the forefoot all the way into the heel on the bilateral plantar feet. They are now both located on the plantar heel distal  aspect both of these have a lot of callus around the wounds I used a #5 curette to remove this on the right and the left also some subcutaneous debris to  try and get the wound edges were using Iodoflex. He has heel offloading shoe 12/22; 2-week follow-up. Not really much improvement. He has thick callus around the outer edges of both wounds. I remove this there is some nonviable subcutaneous tissue as well. We have been using Iodoflex. Her intake nurse and myself spontaneously thought of a total contact cast I went back in May. At that time we really were not seeing much of an improvement with a cast although the wound was in a much different situation I would like to retry this in 2 weeks and I discussed this with the patient 08/12/2021; the patient has had some improvement with the Iodoflex. The the area on the left heel plantar more improved than the right. I had to put him in a total contact cast on the left although I decided to put that off for 2 weeks. I am going to change his primary dressing to silver collagen. I think in both areas he has had some improvement most of the healing seems to be more proximal in the heel. The wounds are in the mid aspect. A lot of thick callus on the right heel however. 1/19; we are using silver collagen on both plantar heel areas. He has had some improvement today. The left did not require any debridement. He still had some eschar on the right that was debrided but both seem to have contracted. I did not put it total contact cast on him today 2/2 we have been using silver collagen. The area on the right plantar heel has areas that appear to be epithelialized interspersed with dry flaking callus and dry skin. I removed this. This really looks better than on the other side. On the left still a large area with raised edges and debris on the surface. 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 He has no issues or complaints today. 2/9; not much change in the left foot wound with 1 week of a cast we are using silver collagen. Silver collagen on the right side. The right side has been the better wound surface. We will reapply the total contact cast on the left 2/16; not much improvement on either side I been using silver collagen with a total contact cast on the left. I'm changing the Hydrofera Blue still with a total contact cast on the left 2/23; some improvement on both sides. Disappointing that he has thick callus around the area that we are putting in a total contact cast on the left. We've been using Hydrofera Blue on both wound areas. This is a man who at essentially pressure ulcers in addition to ischemia caused by medications to support his blood pressure (pressors) in the ICU. He was being ventilated in the standing position for severe Covid. A Shiley the wounds extended across his entire foot but are now localized to his plantar heels bilaterally. We have made progress however neither areas healed. I continue to think the total contact cast is helped albeit painstakingly slowly. He has never wanted a plastic surgery consult although I don't know that they would be interested in grafting in area in this location. 10/07/2021: Continued improvement bilaterally. There is still some callus around the left wound, despite the total contact cast. He has some increased pain in his right midfoot around 1 particular area. This has been painful in the past  but seems to be a little bit worse. When his cast was removed today, there was an area on the heel of the left foot that looks a bit macerated. He is also complaining of pain in his left thigh and hip which he thinks is secondary to the limb length discrepancy caused by the cast. 10/14/2021: He continues to improve. A little bit less callus around the left wound. He continues to endorse  pain in his right midfoot, but this is not as significant as it was last week. The maceration on his left heel is improved. 10/21/2021: Continued improvement to both wounds. The maceration on his left heel is no longer evident. Less callus bilaterally. Epithelialization progressing. 10/28/2021: Significant improvement this week. The right sided wound is nearly closed with just a small open area at the middle. No maceration seen on the left heel. Continued epithelialization on both sides. No concern for infection. 11/04/2021: T oday, the wounds were measured a little bit differently and come out as larger, but I actually think they are about the same to potentially even smaller, particularly on the left. He continues to accumulate some callus on the right. 11/11/2021: T oday, the patient is expressing some concern that the left wound, despite being in the total contact cast, is not progressing at the same rate as the 1 on the right. He is interested in trying a week without the cast to see how the wound does. The wounds are roughly the same size as last week, with the right perhaps being a little bit smaller. He continues to build up callus on both sites. 11/18/2021: Last week, I permitted the patient to go without his total contact cast, just to see if the cast was really making any difference. Today, both wounds [name], [name] ([id number]) [id number] Page 11 of 16 have deteriorated to some extent, suggesting that the cast is providing benefit, at least on the left. Both are larger and have accumulated callus, slough, and other debris. 11/26/2021: I debrided both wounds quite aggressively last week in an effort to stimulate the healing cascade. This appears to have been effective as the left sided wound is a full centimeter shorter in length. Although the right was measured slightly larger, on inspection, it looks as though an area of epithelialized tissue was included in the  measurements. We have been using PolyMem Ag on the wound surfaces with a total contact cast to the left. 12/02/2021: It appears that the intake personnel are including epithelialized tissue in his wound measurements; the right wound is almost completely epithelialized; there is just a crater at the proximal midfoot with some open areas. On the left, he has built up some callus, but the overall wound surface looks good. There is some senescent skin around the wound margin. He has been in PolyMem Ag bilaterally with a total contact cast on the left. 12/09/2021: The right wound is nearly closed; there is just a small open area at the mid calcaneus. On the left, the wound is smaller with minimal callus buildup. No significant drainage. 12/16/2021: The right calcaneal wound remains minimally open at the mid calcaneus; the rest has epithelialized. On the left, the wound is also a little bit smaller. There is some senescent tissue on the periphery. He is getting his first application of a trial skin substitute called Vendaje today. 12/23/2021: The wound on his right calcaneus is nearly closed; there is just a small area at the most distal aspect of the calcaneus that is  open. On the left, the area where we applied to the skin substitute has a healthier-looking bed of granulation tissue. The wound dimensions are not significantly different on this side but the wound surface is improved. 12/30/2021: The wound on the right calcaneus has not changed significantly aside from some accumulation of callus. On the left, the open area is smaller and continues to have an improved surface. He continues to accumulate callus around the wound. He is here for his third application of Vendaje. 01/06/2022: The right calcaneal wound is down to just a couple of millimeters. He continues to accumulate periwound callus. He unfortunately got his cast wet earlier in the week and his left foot is macerated, resulting in some superficial skin  loss just distal to the open wound. The open wound itself, however, is much smaller and has a healthier appearing surface. He is here for his fourth application of Vendaje. 01/13/2022: The right calcaneal wound is about the same. Unfortunately, once again, his cast got wet and his foot is again macerated. This is caused the left calcaneal wound to enlarge. He is here for his fifth application of Vendaje. 01/20/2022: The right calcaneal wound is very small. There is some periwound callus accumulation. He purchased a new cast protector last week and this has been effective in avoiding the maceration that has been occurring on the left. The left calcaneal wound is narrower and has a healthy and viable-appearing surface. He is here for his 6 application of Vendaje. 01/27/2022: The right calcaneal wound is down to just a pinhole. There is some periwound slough and callus. On the left, the wound is narrower and shorter by about a centimeter. The surface is robust and viable-appearing. Unfortunately, the rep for the trial skin substitute product did not provide any for [place] to use today. 02/04/2022: The right calcaneal wound remains unchanged. There is more accumulated callus. On the left, although the intake nurse measured it a little bit longer, it looks about the same to me. The surface has a layer of slough, but underneath this, there is good granulation tissue. 02/10/2022: The right calcaneus wound is nearly closed. There is still some callus that builds up around the site. The left side looks about the same in terms of dimensions, but the surface is more robust and vital-appearing. 02/16/2022: The area of the right calcaneus that was nearly closed last week has closed, but there is a small opening at the mid foot where it looks like some moisture got retained and caused some reopening. The left foot wound is narrower and shallower. Both sites have a fair amount of periwound callus and eschar. 02/24/2022: The  small midfoot opening on the right calcaneus is a little bit smaller today. The left foot wound is narrower and shallower. He continues to accumulate periwound callus. No concern for infection. 03/01/2022: The patient came to clinic early because he showered and got his cast wet. Fortunately, there is no significant maceration to his foot but the callus softened and it looks like the wound on his left calcaneus may be a little bit wider. The wound on his right calcaneus is just a narrow slit. Continued accumulation of periwound callus bilaterally. 03/08/2022: The wound on his right calcaneus is very nearly closed, just a small pinpoint opening under a bit of eschar; the left wound has come in quite a bit, as well. It is narrower and shorter than at our last visit. Still with accumulated callus and eschar bilaterally. 03/17/2022: The right calcaneal  wound is healed. The left wound is smaller and the surface itself is very clean, but there is some blue-green staining on the periwound callus, concerning for Pseudomonas aeruginosa. 03/23/2022: The right calcaneal wound remains closed. The left wound continues to contract. No further blue-green staining. Small amount of callus and slough accumulation. 03/28/2022: He came in early today because he had gotten his cast wet. On inspection, the wound itself did not get wet or macerated, just a little bit of the forefoot. The wound itself is basically unchanged. 04/07/2022: The right foot wound remains closed. The left wound is the smallest that I have seen it to date. It is narrower and shorter. It still continues to accumulate slough on the surface. 04/15/2022: There is a band of epithelium now dividing the small left plantar foot wound in 2. There is still some slough on the surface. 04/21/2022: The wound continues to narrow. Just a little bit of slough on the surface. He seems to be responding well to endoform. 04/28/2022: Continued slow contraction of the wound.  There is a little slough on the surface and some periwound callus. We have been using endoform and total contact cast. 05/05/2022: The wound appears to have stalled. There is slough and some periwound eschar/callus. No concern for infection, however. 05/12/2022: Unfortunately, his right foot has reopened. It is located at the most posterior aspect of his surgical incision. The area was noted to have drainage coming from it when his padding was removed today. Underneath some callus and senescent skin, there is an opening. No purulent drainage or malodor. On the left foot, the wound is again unchanged. There is some light blue staining on the callus, but no malodor or purulent drainage. 10/13; right and left heel remanence of extensive plantar foot wounds. These are better than I remember by quite a big margin however he is still left with wounds on the left plantar heel and the right plantar heel. Been using endoform bilaterally. A culture was done that showed apparently Pseudomonas but we are still waiting for the [facility] antibiotic to use gentamicin today. He is still very active by description I am not sure about the offloading of his noncasted right foot 10/20; both wounds right and left heel debrided not much change from last week. [employer] has arrived which is linezolid, gentamicin and ciprofloxacin we will use this with endoform. T contact cast on the left otal 06/02/2022: Both wounds are smaller today. There is still a fair amount of callus buildup around the right foot ulcer. The left is more superficial and nearly flush with the surrounding tissues. Also with slough and eschar buildup. [name], [name] ([id number]) [id number] Page 12 of 16 06/10/2022: The right sided wound appears to be nearly closed, if not completely so, although it is somewhat difficult to tell given the abnormal tissue and scarring in his foot. There is a fair amount of callus and crust  accumulation. On the left, the wound looks about the same, again with callus and slough. He has an appointment next Thursday with Dr. Loel Lofty in podiatry; I am hopeful that there may be some reconstructive options available for Mr. Prater. 06/16/2022: Both wounds have some eschar and callus accumulation. The right sided wound is extremely narrow and barely open; the left is narrower than last week. There is a little bit of slough. He has his appointment in podiatry later today. 06/23/2022: The patient met with Dr. Loel Lofty last week and unfortunately, there are no reconstructive options that he believes  would be helpful. He did order an MRI to evaluate for osteomyelitis and fortunately, none was seen. The left sided wound is a little bit shorter and narrower today. The right sided wound is about the same. There is callus and eschar accumulation bilaterally. 06/29/2022: Both feet have improved from last week. There is epithelium making a valiant effort to creep across the surface on the left. The right side looks like it got a little dry and the deep crevasse in his midfoot has cracked. Both have eschar and there is some slough on the left. 07/07/2022: Both feet have improved. There is epithelium completely covering the calcaneus on the right with just a small opening in the crevasse in his midfoot. On the left, the open area of tissue is smaller but he continues to build up callus/eschar and slough. 07/15/2022: The opening in the midfoot on the right is about the same size, covered with eschar and a little bit of slough. The open portion of the left wound is narrower and shorter with a bit of slough buildup. He admits to being on his feet more than recommended. Patient History Information obtained from Patient. Family History Cancer - Maternal Grandparents, Diabetes - Father,Paternal Grandparents, Heart Disease - Maternal Grandparents, Hypertension - Father,Paternal Grandparents, Lung Disease -  Siblings, No family history of Hereditary Spherocytosis, Kidney Disease, Seizures, Stroke, Thyroid Problems, Tuberculosis. Social History Never smoker, Marital Status - Married, Alcohol Use - Never, Drug Use - No History, Caffeine Use - Daily - tea, soda. Medical History Eyes Denies history of Cataracts, Glaucoma, Optic Neuritis Ear/Nose/Mouth/Throat Denies history of Chronic sinus problems/congestion, Middle ear problems Hematologic/Lymphatic Denies history of Anemia, Hemophilia, Human Immunodeficiency Virus, Lymphedema, Sickle Cell Disease Respiratory Patient has history of Asthma Denies history of Aspiration, Chronic Obstructive Pulmonary Disease (COPD), Pneumothorax, Sleep Apnea, Tuberculosis Cardiovascular Patient has history of Angina - with COVID, Hypertension Denies history of Arrhythmia, Congestive Heart Failure, Coronary Artery Disease, Deep Vein Thrombosis, Hypotension, Myocardial Infarction, Peripheral Arterial Disease, Peripheral Venous Disease, Phlebitis, Vasculitis Gastrointestinal Denies history of Cirrhosis , Colitis, Crohnoos, Hepatitis A, Hepatitis B, Hepatitis C Endocrine Denies history of Type I Diabetes, Type II Diabetes Genitourinary Denies history of End Stage Renal Disease Immunological Denies history of Lupus Erythematosus, Raynaudoos, Scleroderma Integumentary (Skin) Denies history of History of Burn Musculoskeletal Denies history of Gout, Rheumatoid Arthritis, Osteoarthritis, Osteomyelitis Neurologic Denies history of Dementia, Neuropathy, Quadriplegia, Paraplegia, Seizure Disorder Oncologic Denies history of Received Chemotherapy, Received Radiation Psychiatric Denies history of Anorexia/bulimia, Confinement Anxiety Hospitalization/Surgery History - COVID PNA 07/22/2019- 11/14/2019. - 03/27/2020 wound debridement/ skin graft. Medical A Surgical History Notes nd Constitutional Symptoms (General Health) COVID PNA 07/22/2019-11/14/2019 V[facility] ECMO, foot  drop left foot , Genitourinary kidney stone Psychiatric anxiety Objective Constitutional No acute distress. [name], [name] ([id number]) [id number] Page 13 of 16 Vitals Time Taken: 9:53 AM, Height: 69 in, Weight: 280 lbs, BMI: 41.3, Temperature: 98.9 F, Pulse: 85 bpm, Respiratory Rate: 18 breaths/min, Blood Pressure: 136/90 mmHg. Respiratory Normal work of breathing on room air. General Notes: 07/15/2022: The opening in the midfoot on the right is about the same size, covered with eschar and a little bit of slough. The open portion of the left wound is narrower and shorter with a bit of slough buildup. Integumentary (Hair, Skin) Wound #1 status is Open. Original cause of wound was Pressure Injury. The date acquired was: 10/07/2019. The wound has been in treatment 104 weeks. The wound is located on the Right Calcaneus. The wound measures 0.6cm length x 0.8cm  width x 0.2cm depth; 0.377cm^2 area and 0.075cm^3 volume. There is Fat Layer (Subcutaneous Tissue) exposed. There is no tunneling or undermining noted. There is a medium amount of serosanguineous drainage noted. The wound margin is thickened. There is large (67-100%) pink granulation within the wound bed. There is a small (1-33%) amount of necrotic tissue within the wound bed including Eschar and Adherent Slough. The periwound skin appearance had no abnormalities noted for moisture. The periwound skin appearance had no abnormalities noted for color. The periwound skin appearance exhibited: Callus. Periwound temperature was noted as No Abnormality. Wound #2 status is Open. Original cause of wound was Pressure Injury. The date acquired was: 10/07/2019. The wound has been in treatment 104 weeks. The wound is located on the Left Calcaneus. The wound measures 1.8cm length x 0.5cm width x 0.2cm depth; 0.707cm^2 area and 0.141cm^3 volume. There is Fat Layer (Subcutaneous Tissue) exposed. There is no tunneling or  undermining noted. There is a medium amount of serosanguineous drainage noted. The wound margin is thickened. There is large (67-100%) red, pink granulation within the wound bed. There is a small (1-33%) amount of necrotic tissue within the wound bed including Eschar and Adherent Slough. The periwound skin appearance had no abnormalities noted for color. The periwound skin appearance exhibited: Callus, Scarring, Maceration. Periwound temperature was noted as No Abnormality. Assessment Active Problems ICD-10 Non-pressure chronic ulcer of other part of right foot with fat layer exposed Non-pressure chronic ulcer of other part of left foot with fat layer exposed Procedures Wound #1 Pre-procedure diagnosis of Wound #1 is a Pressure Ulcer located on the Right Calcaneus . There was a Selective/Open Wound Skin/Epidermis Debridement with a total area of 0.48 sq cm performed by Fredirick Maudlin, MD. With the following instrument(s): Curette to remove Non-Viable tissue/material. Material removed includes Callus, Slough, and Skin: Epidermis after achieving pain control using Lidocaine 5% topical ointment. No specimens were taken. A time out was conducted at 10:05, prior to the start of the procedure. A Minimum amount of bleeding was controlled with Pressure. The procedure was tolerated well. Post Debridement Measurements: 0.6cm length x 0.8cm width x 0.2cm depth; 0.075cm^3 volume. Post debridement Stage noted as Category/Stage III. Character of Wound/Ulcer Post Debridement requires further debridement. Post procedure Diagnosis Wound #1: Same as Pre-Procedure General Notes: Scribed for Dr. [name] by Blanche East, RN. Wound #2 Pre-procedure diagnosis of Wound #2 is a Pressure Ulcer located on the Left Calcaneus . There was a Selective/Open Wound Non-Viable Tissue Debridement with a total area of 0.9 sq cm performed by Fredirick Maudlin, MD. With the following instrument(s): Curette to remove Non-Viable  tissue/material. Material removed includes [facility] after achieving pain control using Lidocaine 5% topical ointment. No specimens were taken. A time out was conducted at 10:05, prior to the start of the procedure. A Minimum amount of bleeding was controlled with Pressure. The procedure was tolerated well. Post Debridement Measurements: 1.8cm length x 0.5cm width x 0.2cm depth; 0.141cm^3 volume. Post debridement Stage noted as Category/Stage III. Character of Wound/Ulcer Post Debridement requires further debridement. Post procedure Diagnosis Wound #2: Same as Pre-Procedure General Notes: Scribed for Dr. [name] by Blanche East, RN. Plan Follow-up Appointments: Return Appointment in 1 week. - Dr. [name] Room 3 Anesthetic: Wound #2 Left Calcaneus: (In clinic) Topical Lidocaine 4% applied to wound bed - In clinic Bathing/ Shower/ Hygiene: May shower with protection but do not get wound dressing(s) wet. - Cover with cast protector (can purchase cast protector at [facility] or [facility] (Cast on  hold 06/23/22) Edema Control - Lymphedema / SCD / Other: Elevate legs to the level of the heart or above for 30 minutes daily and/or when sitting, a frequency of: - throughout the day Avoid standing for long periods of time. Moisturize legs daily. - as needed Off-Loading: Other: - keep pressure off of the bottom of your feet. Globoped to left foot-when not wearing the cast Elevate legs throughout the day Additional Orders / Instructions: Follow Nutritious Diet WOUND #1: - Calcaneus Wound Laterality: Right Cleanser: Normal Saline (Generic) Every Other Day/30 Days [name], [name] ([id number]) [id number] Page 14 of 16 Discharge Instructions: Cleanse the wound with Normal Saline prior to applying a clean dressing using gauze sponges, not tissue or cotton balls. Cleanser: Wound Cleanser (Generic) Every Other Day/30 Days Discharge Instructions: Cleanse the wound with wound cleanser prior  to applying a clean dressing using gauze sponges, not tissue or cotton balls. Prim Dressing: Promogran Prisma Matrix, 4.34 (sq in) (silver collagen) Every Other Day/30 Days ary Discharge Instructions: Moisten collagen with saline or hydrogel Secondary Dressing: ALLEVYN Heel 4 1/2in x 5 1/2in / 10.5cm x 13.5cm (Generic) Every Other Day/30 Days Discharge Instructions: Apply over primary dressing as directed. Secondary Dressing: Woven Gauze Sponge, Non-Sterile 4x4 in Every Other Day/30 Days Discharge Instructions: Apply over primary dressing as directed. Secured With: Child psychotherapist, Sterile 2x75 (in/in) (Generic) Every Other Day/30 Days Discharge Instructions: Secure with stretch gauze as directed. Secured With: 30M Medipore H Soft Cloth Surgical T ape, 4 x 10 (in/yd) (Generic) Every Other Day/30 Days Discharge Instructions: Secure with tape as directed. WOUND #2: - Calcaneus Wound Laterality: Left Cleanser: Normal Saline (Generic) 1 x Per Week/30 Days Discharge Instructions: Cleanse the wound with Normal Saline prior to applying a clean dressing using gauze sponges, not tissue or cotton balls. Cleanser: Wound Cleanser 1 x Per Week/30 Days Discharge Instructions: Cleanse the wound with wound cleanser prior to applying a clean dressing using gauze sponges, not tissue or cotton balls. Topical: keystone 1 x Per Week/30 Days Discharge Instructions: use on Left Calcaneus Prim Dressing: Endoform 2x2 in 1 x Per Week/30 Days ary Discharge Instructions: Moisten with saline or Hydrogel. Put on after appt. with [facility]11/9/23 Secondary Dressing: Optifoam Non-Adhesive Dressing, 4x4 in (Generic) 1 x Per Week/30 Days Discharge Instructions: Apply over primary dressing as directed. Secondary Dressing: Woven Gauze Sponge, Non-Sterile 4x4 in 1 x Per Week/30 Days Discharge Instructions: Apply over primary dressing as directed. Secured With: 30M Medipore H Soft Cloth Surgical T ape,  4 x 10 (in/yd) (Generic) 1 x Per Week/30 Days Discharge Instructions: Secure with tape as directed. 07/15/2022: The opening in the midfoot on the right is about the same size, covered with eschar and a little bit of slough. The open portion of the left wound is narrower and shorter with a bit of slough buildup. I used a curette to debride the eschar and slough from the right sided wound and the slough from the left-sided wound. We will continue Prisma on the right and Keystone topical antimicrobial on the left with endoform. I reminded him of the importance of staying off of his feet. Follow-up in 1 week. Electronic Signature(s) Signed: 07/15/2022 10:48:58 AM By: Fredirick Maudlin MD FACS Entered By: Fredirick Maudlin on 07/15/2022 10:48:58 -------------------------------------------------------------------------------- HxROS Details Patient Name: Date of Service: [name], [place] 07/15/2022 9:45 A M Medical Record Number: [id number] Patient Account Number: [id number] Date of Birth/Sex: Treating RN: August 19, 1975 (46 y.o. M) Primary Care Provider: Jacalyn Lefevre,  Mickel Baas Other Clinician: Referring Provider: Treating Provider/Extender: [name] in Treatment: 104 Information Obtained From Patient Constitutional Symptoms (General Health) Medical History: Past Medical History Notes: COVID PNA 07/22/2019-11/14/2019 VENT ECMO, foot drop left foot , Eyes Medical History: Negative for: Cataracts; Glaucoma; Optic Neuritis Ear/Nose/Mouth/Throat Medical History: Negative for: Chronic sinus problems/congestion; Middle ear problems Hematologic/Lymphatic LEIAM, HOPWOOD ([id number]) [id number] Page 15 of 16 Medical History: Negative for: Anemia; Hemophilia; Human Immunodeficiency Virus; Lymphedema; Sickle Cell Disease Respiratory Medical History: Positive for: Asthma Negative for: Aspiration; Chronic Obstructive Pulmonary Disease (COPD); Pneumothorax; Sleep  Apnea; Tuberculosis Cardiovascular Medical History: Positive for: Angina - with COVID; Hypertension Negative for: Arrhythmia; Congestive Heart Failure; Coronary Artery Disease; Deep Vein Thrombosis; Hypotension; Myocardial Infarction; Peripheral Arterial Disease; Peripheral Venous Disease; Phlebitis; Vasculitis Gastrointestinal Medical History: Negative for: Cirrhosis ; Colitis; Crohns; Hepatitis A; Hepatitis B; Hepatitis C Endocrine Medical History: Negative for: Type I Diabetes; Type II Diabetes Genitourinary Medical History: Negative for: End Stage Renal Disease Past Medical History Notes: kidney stone Immunological Medical History: Negative for: Lupus Erythematosus; Raynauds; Scleroderma Integumentary (Skin) Medical History: Negative for: History of Burn Musculoskeletal Medical History: Negative for: Gout; Rheumatoid Arthritis; Osteoarthritis; Osteomyelitis Neurologic Medical History: Negative for: Dementia; Neuropathy; Quadriplegia; Paraplegia; Seizure Disorder Oncologic Medical History: Negative for: Received Chemotherapy; Received Radiation Psychiatric Medical History: Negative for: Anorexia/bulimia; Confinement Anxiety Past Medical History Notes: anxiety Immunizations Pneumococcal Vaccine: Received Pneumococcal Vaccination: No Implantable Devices None Hospitalization / Surgery History Type of Hospitalization/Surgery COVID PNA 07/22/2019- 11/14/2019 03/27/2020 wound debridement/ skin graft [name], [name] ([id number]) [id number] Page 16 of 12 Family and Social History Cancer: Yes - Maternal Grandparents; Diabetes: Yes - Father,Paternal Grandparents; Heart Disease: Yes - Maternal Grandparents; Hereditary Spherocytosis: No; Hypertension: Yes - Father,Paternal Grandparents; Kidney Disease: No; Lung Disease: Yes - Siblings; Seizures: No; Stroke: No; Thyroid Problems: No; Tuberculosis: No; Never smoker; Marital Status - Married; Alcohol  Use: Never; Drug Use: No History; Caffeine Use: Daily - tea, soda; Financial Concerns: No; Food, Clothing or Shelter Needs: No; Support System Lacking: No; Transportation Concerns: No Electronic Signature(s) Signed: 07/15/2022 12:03:59 PM By: Fredirick Maudlin MD FACS Entered By: Fredirick Maudlin on 07/15/2022 10:45:03 -------------------------------------------------------------------------------- SuperBill Details Patient Name: Date of Service: [name], [place] 07/15/2022 Medical Record Number: [id number] Patient Account Number: [id number] Date of Birth/Sex: Treating RN: [birth year] (46 y.o. M) Primary Care Provider: [name] Other Clinician: Referring Provider: Treating Provider/Extender: [name] in Treatment: 104 Diagnosis Coding ICD-10 Codes Code Description [phone] Non-pressure chronic ulcer of other part of right foot with fat layer exposed L97.522 Non-pressure chronic ulcer of other part of left foot with fat layer exposed Facility Procedures : CPT4 Code: [id number] Description: 97597 - DEBRIDE WOUND 1ST 20 SQ CM OR < ICD-10 Diagnosis Description L97.512 Non-pressure chronic ulcer of other part of right foot with fat layer exposed L97.522 Non-pressure chronic ulcer of other part of left foot with fat layer exposed Modifier: Quantity: 1 Physician Procedures : CPT4 Code Description Modifier [id number] - WC PHYS LEVEL 4 - EST PT 25 ICD-10 Diagnosis Description L97.512 Non-pressure chronic ulcer of other part of right foot with fat layer exposed L97.522 Non-pressure chronic ulcer of other part of left foot  with fat layer exposed Quantity: 1 : [id number] - WC PHYS DEBR WO ANESTH 20 SQ CM ICD-10 Diagnosis Description L97.512 Non-pressure chronic ulcer of other part of right foot with fat layer exposed L97.522 Non-pressure chronic ulcer of other part of left foot with fat layer exposed  Quantity: 1 Electronic Signature(s) Signed: 07/15/2022 10:49:43 AM  By: Fredirick Maudlin MD FACS Entered By: Fredirick Maudlin on 07/15/2022 10:49:43

## 2022-07-19 ENCOUNTER — Telehealth: Payer: Self-pay | Admitting: Allergy & Immunology

## 2022-07-19 ENCOUNTER — Telehealth: Payer: Self-pay | Admitting: Physical Medicine and Rehabilitation

## 2022-07-19 MED ORDER — ALBUTEROL SULFATE HFA 108 (90 BASE) MCG/ACT IN AERS: INHALATION_SPRAY | RESPIRATORY_TRACT | 1 refills | Status: DC

## 2022-07-19 MED ORDER — TRELEGY ELLIPTA 100-62.5-25 MCG/ACT IN AEPB: 1.0000 | INHALATION_SPRAY | Freq: Every day | RESPIRATORY_TRACT | 0 refills | Status: DC

## 2022-07-19 NOTE — Telephone Encounter (Signed)
I spoke with Mr [name] and asked if he had approached his PCP to fill out his papers but they would not fill them out because of what Dr Berline Chough was seeing him for.

## 2022-07-19 NOTE — Telephone Encounter (Signed)
[facility] in [facility] pt was just seen in aug 2023

## 2022-07-19 NOTE — Telephone Encounter (Signed)
Patient called and made appointment for 08/09/2022// and needs refills on his albuterol inhaler and Trelegy.[facility] [address] [place] [phone]

## 2022-07-19 NOTE — Telephone Encounter (Signed)
Patient is needing disability paperwork and would like to know if another PM&R physician is able to complete given Dr. Berline Chough is out of the office

## 2022-07-21 ENCOUNTER — Encounter (HOSPITAL_BASED_OUTPATIENT_CLINIC_OR_DEPARTMENT_OTHER): Payer: PPO | Admitting: Internal Medicine

## 2022-07-21 DIAGNOSIS — L97512 Non-pressure chronic ulcer of other part of right foot with fat layer exposed: Secondary | ICD-10-CM | POA: Diagnosis not present

## 2022-07-21 DIAGNOSIS — L97422 Non-pressure chronic ulcer of left heel and midfoot with fat layer exposed: Secondary | ICD-10-CM | POA: Diagnosis not present

## 2022-07-21 DIAGNOSIS — L97412 Non-pressure chronic ulcer of right heel and midfoot with fat layer exposed: Secondary | ICD-10-CM | POA: Diagnosis not present

## 2022-07-21 NOTE — Progress Notes (Signed)
[name] ([id number]) [id number].pdf Page 1 of 8 Visit Report for 07/21/2022 Arrival Information Details Patient Name: Date of Service: [name], [name] 07/21/2022 9:15 A M Medical Record Number: [id number] Patient Account Number: [id number] Date of Birth/Sex: Treating RN: Apr 19, 1976 (46 y.o. [name] Primary Care [name]: [name] Other Clinician: Dayton [name] Referring [name]: Treating [name]/Extender: [name] in Treatment: 105 Visit Information History Since Last Visit All ordered tests and consults were completed: No Patient Arrived: Wheel Chair Added or deleted any medications: No Arrival Time: 09:22 Any new allergies or adverse reactions: No Accompanied By: wife Had a fall or experienced change in No Transfer Assistance: None activities of daily living that may affect Patient Identification Verified: Yes risk of falls: Secondary Verification Process Completed: Yes Signs or symptoms of abuse/neglect since last visito No Patient Requires Transmission-Based Precautions: No Hospitalized since last visit: No Patient Has Alerts: No Implantable device outside of the clinic excluding No cellular tissue based products placed in the center since last visit: Pain Present Now: No Electronic Signature(s) Signed: 07/21/2022 11:12:47 AM By: [name] Entered By: [name] on 07/21/2022 09:22:37 -------------------------------------------------------------------------------- Clinic Level of Care Assessment Details Patient Name: Date of Service: [name] [name] 07/21/2022 9:15 A M Medical Record Number: [id number] Patient Account Number: [id number] Date of Birth/Sex: Treating RN: [birth year] (46 y.o. [name] Primary Care Khaliel Morey: [name] Other Clinician: Dayton [name] Referring [name]: Treating [name]/Extender: [name] in Treatment: 105 Clinic Level of Care  Assessment Items TOOL 1 Quantity Score X- 1 0 Use when EandM and Procedure is performed on INITIAL visit ASSESSMENTS - Nursing Assessment / Reassessment X- 1 20 General Physical Exam (combine w/ comprehensive assessment (listed just below) when performed on new pt. evals) X- 1 25 Comprehensive Assessment (HX, ROS, Risk Assessments, Wounds Hx, etc.) ASSESSMENTS - Wound and Skin Assessment / Reassessment []  - 0 Dermatologic / Skin Assessment (not related [name] wound area) ASSESSMENTS - Ostomy and/or Continence Assessment and Care []  - 0 Incontinence Assessment and Management []  - 0 Ostomy Care Assessment and Management (repouching, etc.) PROCESS - Coordination of Care X - Simple Patient / Family Education for ongoing care 1 15 []  - 0 Complex (extensive) Patient / Family Education for ongoing care [name], [name] ( ) [id number] Page 2 of 8 X- 1 10 Staff obtains Consents, Records, T Results / Process Orders est X- 1 10 Staff telephones HHA, Nursing Homes / Clarify orders / etc []  - 0 Routine Transfer [name] another Facility (non-emergent condition) []  - 0 Routine Hospital Admission (non-emergent condition) []  - 0 New Admissions / / Ordering NPWT Apligraf, etc. , []  - 0 Emergency Hospital Admission (emergent condition) PROCESS - Special Needs []  - 0 Pediatric / Minor Patient Management []  - 0 Isolation Patient Management []  - 0 Hearing / Language / Visual special needs []  - 0 Assessment of Community assistance (transportation, D/C planning, etc.) []  - 0 Additional assistance / Altered mentation []  - 0 Support Surface(s) Assessment (bed, cushion, seat, etc.) INTERVENTIONS - Miscellaneous []  - 0 External ear exam []  - 0 Patient Transfer (multiple staff / / Similar devices) []  - 0 Simple Staple / Suture removal (25 or less) []  - 0 Complex Staple / Suture removal (26 or more) []  - 0 Hypo/Hyperglycemic  Management (do not check if billed separately) []  - 0 Ankle / Brachial Index (ABI) - do not check if billed separately Has the  patient been seen at the hospital within the last three years: Yes Total Score: 80 Level Of Care: New/Established - Level 3 Electronic Signature(s) Signed: 07/21/2022 5:08:57 PM By: [name] Schwalbe RN Entered By: [name] on 07/21/2022 17:05:16 -------------------------------------------------------------------------------- Encounter Discharge Information Details Patient Name: Date of Service: [name], [name] 07/21/2022 9:15 A M Medical Record Number: [id number] Patient Account Number: [id number] Date of Birth/Sex: Treating RN: 12-Oct-1975 (46 y.o. [name] Primary Care Liane Tribbey: [name] Other Clinician: Dayton [name] Referring Jahnavi Muratore: Treating Nyelli Samara/Extender: [name] in Treatment: 105 Encounter Discharge Information Items Discharge Condition: Stable Ambulatory Status: Wheelchair Discharge Destination: Home Transportation: Private Auto Accompanied By: spouse Schedule Follow-up Appointment: Yes Clinical Summary of Care: Patient Declined Electronic Signature(s) Signed: 07/21/2022 5:08:57 PM By: [name] Schwalbe RN Entered By: [name] on 07/21/2022 17:06:18 [name] ([id number]) [id number] Page 3 of 8 -------------------------------------------------------------------------------- Lower Extremity Assessment Details Patient Name: Date of Service: [name] 07/21/2022 9:15 A M Medical Record Number: [id number] Patient Account Number: [id number] Date of Birth/Sex: Treating RN: 30-Nov-1975 (46 y.o. [name] Primary Care Khaleelah Yowell: [name] Other Clinician: Dayton [name] Referring Sheriann Newmann: Treating Amahd Morino/Extender: [name] in Treatment: 105 Edema Assessment Assessed: [name]: No] [Right: No] Edema: [Left: N] [Right:  o] Calf Left: Right: Point of Measurement: 29 cm From Medial Instep 45.6 cm 44.8 cm Ankle Left: Right: Point of Measurement: 9 cm From Medial Instep 23.5 cm 25.1 cm Vascular Assessment Pulses: Dorsalis Pedis Palpable: [Left:Yes] [Right:Yes] Electronic Signature(s) Signed: 07/21/2022 5:08:57 PM By: [name] Schwalbe RN Entered By: [name] on 07/21/2022 09:33:04 -------------------------------------------------------------------------------- Multi-Disciplinary Care Plan Details Patient Name: Date of Service: [name], [name] 07/21/2022 9:15 A M Medical Record Number: [id number] Patient Account Number: [id number] Date of Birth/Sex: Treating RN: 1975-09-10 (46 y.o. [name] Primary Care Refael Fulop: [name] Other Clinician: Dayton [name] Referring Jeliyah Middlebrooks: Treating Sayvion Vigen/Extender: [name] in Treatment: 105 Multidisciplinary Care Plan reviewed with physician Active Inactive Wound/Skin Impairment Nursing Diagnoses: Impaired tissue integrity Knowledge deficit related [name] ulceration/compromised skin integrity Goals: Patient/caregiver will verbalize understanding of skin care regimen Date Initiated: 07/15/2020 Target Resolution Date: 03/08/2023 Goal Status: Active Ulcer/skin breakdown will have a volume reduction of 30% by week 4 Date Initiated: 07/15/2020 Date Inactivated: 08/20/2020 Target Resolution Date: 09/03/2020 Goal Status: Unmet Unmet Reason: no major changes. [name], [name] ([id number]) [id number].pdf Page 4 of 8 Ulcer/skin breakdown will heal within 14 weeks Date Initiated: 12/04/2020 Date Inactivated: 12/10/2020 Target Resolution Date: 12/10/2020 Unmet Reason: wounds still open at 14 Goal Status: Unmet weeks and today 21 weeks. Interventions: Assess patient/caregiver ability [name] obtain necessary supplies Assess patient/caregiver ability [name] perform ulcer/skin care regimen upon admission and as  needed Assess ulceration(s) every visit Provide education on ulcer and skin care Treatment Activities: Skin care regimen initiated : 07/15/2020 Topical wound management initiated : 07/15/2020 Notes: Electronic Signature(s) Signed: 07/21/2022 5:08:57 PM By: [name] Schwalbe RN Entered By: [name] on 07/21/2022 17:03:05 -------------------------------------------------------------------------------- Pain Assessment Details Patient Name: Date of Service: [name], [name] 07/21/2022 9:15 A M Medical Record Number: [id number] Patient Account Number: [id number] Date of Birth/Sex: Treating RN: 06-18-1976 (46 y.o. [name] Primary Care Jex Strausbaugh: [name] Other Clinician: Dayton [name] Referring Laysa Kimmey: Treating Veneda Kirksey/Extender: [name] in Treatment: 105 Active Problems Location of Pain Severity and Description of Pain Patient Has Paino Yes Site Locations Rate the pain. Current Pain Level: 4 Worst Pain Level:  10 Least Pain Level: 0 Tolerable Pain Level: 2 Pain Management and Medication Current Pain Management: Electronic Signature(s) Signed: 07/21/2022 11:12:47 AM By: [name] Signed: 07/21/2022 5:08:57 PM By: [name] Schwalbe RN Entered By: [name] on 07/21/2022 09:24:19 [name] ([id number]) [id number] Page 5 of 8 -------------------------------------------------------------------------------- Patient/Caregiver Education Details Patient Name: Date of Service: [name] 12/14/2023andnbsp9:15 A M Medical Record Number: [id number] Patient Account Number: [id number] Date of Birth/Gender: Treating RN: [birth year] (46 y.o. [name] Primary Care Physician: [name] Other Clinician: Dayton [name] Referring Physician: Treating Physician/Extender: [name] in Treatment: 105 Education Assessment Education Provided [name]: Patient Education Topics  Provided Wound/Skin Impairment: Methods: Explain/Verbal Responses: Return demonstration correctly Electronic Signature(s) Signed: 07/21/2022 5:08:57 PM By: [name] Schwalbe RN Entered By: [name] on 07/21/2022 17:03:21 -------------------------------------------------------------------------------- Wound Assessment Details Patient Name: Date of Service: [name], [name] 07/21/2022 9:15 A M Medical Record Number: [id number] Patient Account Number: [id number] Date of Birth/Sex: Treating RN: Mar 03, 1976 (46 y.o. [name] Primary Care Tennyson Kallen: [name] Other Clinician: Dayton [name] Referring Rosealynn Mateus: Treating Fitzroy Mikami/Extender: [name] in Treatment: 105 Wound Status Wound Number: 1 Primary Etiology: Pressure Ulcer Wound Location: Right Calcaneus Wound Status: Healed - Epithelialized Wounding Event: Pressure Injury Comorbid History: Asthma, Angina, Hypertension Date Acquired: 10/07/2019 Weeks Of Treatment: 105 Clustered Wound: No Photos Wound Measurements Length: (cm) Width: (cm) [name], [name] ([id number]) Depth: (cm) Area: (cm) Volume: (cm) 0 % Reduction in Area: 100% 0 % Reduction in Volume: 100% [id number] Page 6 of 8 0 Epithelialization: Large (67-100%) 0 Tunneling: No 0 Undermining: No Wound Description Classification: Category/Stage III Wound Margin: Thickened Exudate Amount: None Present Foul Odor After Cleansing: No Slough/Fibrino No Wound Bed Granulation Amount: None Present (0%) Exposed Structure Necrotic Amount: None Present (0%) Fascia Exposed: No Fat Layer (Subcutaneous Tissue) Exposed: No Tendon Exposed: No Muscle Exposed: No Joint Exposed: No Bone Exposed: No Periwound Skin Texture Texture Color No Abnormalities Noted: Yes No Abnormalities Noted: Yes Moisture Temperature / Pain No Abnormalities Noted: Yes Temperature: No Abnormality Electronic Signature(s) Signed:  07/21/2022 5:08:57 PM By: [name] Schwalbe RN Entered By: [name] on 07/21/2022 17:02:47 -------------------------------------------------------------------------------- Wound Assessment Details Patient Name: Date of Service: [name], [name] 07/21/2022 9:15 A M Medical Record Number: [id number] Patient Account Number: [id number] Date of Birth/Sex: Treating RN: [birth year] (46 y.o. [name] Primary Care Merdith Boyd: [name] Other Clinician: Dayton [name] Referring Raja Liska: Treating Bria Sparr/Extender: [name] in Treatment: 105 Wound Status Wound Number: 2 Primary Etiology: Pressure Ulcer Wound Location: Left Calcaneus Wound Status: Open Wounding Event: Pressure Injury Comorbid History: Asthma, Angina, Hypertension Date Acquired: 10/07/2019 Weeks Of Treatment: 105 Clustered Wound: No Photos Wound Measurements Length: (cm) 1.7 Width: (cm) 0.4 Depth: (cm) 0.1 Area: (cm) 0. Volume: (cm) 0. [name], [name] ([id number]) % Reduction in Area: 98% % Reduction in Volume: 99.8% Epithelialization: Medium (34-66%) 534 Tunneling: No 053 Undermining: No [id number] Page 7 of 8 Wound Description Classification: Category/Stage III Wound Margin: Thickened Exudate Amount: Medium Exudate Type: Serosanguineous Exudate Color: red, brown Foul Odor After Cleansing: No Slough/Fibrino Yes Wound Bed Granulation Amount: Large (67-100%) Exposed Structure Granulation Quality: Red, Pink Fascia Exposed: No Necrotic Amount: Small (1-33%) Fat Layer (Subcutaneous Tissue) Exposed: Yes Necrotic Quality: Eschar, Adherent Slough Tendon Exposed: No Muscle Exposed: No Joint Exposed: No Bone Exposed: No Periwound Skin Texture Texture Color No Abnormalities Noted: No No Abnormalities Noted: Yes Callus: Yes Temperature / Pain  Scarring: Yes Temperature: No Abnormality Moisture No Abnormalities Noted: No Maceration:  Yes Treatment Notes Wound #2 (Calcaneus) Wound Laterality: Left Cleanser Normal Saline Discharge Instruction: Cleanse the wound with Normal Saline prior [name] applying a clean dressing using gauze sponges, not tissue or cotton balls. Wound Cleanser Discharge Instruction: Cleanse the wound with wound cleanser prior [name] applying a clean dressing using gauze sponges, not tissue or cotton balls. Peri-Wound Care Topical keystone Discharge Instruction: use on Left Calcaneus Primary Dressing Promogran Prisma Matrix, 4.34 (sq in) (silver collagen) Discharge Instruction: Moisten collagen with saline or hydrogel Secondary Dressing Optifoam Non-Adhesive Dressing, 4x4 in Discharge Instruction: Apply over primary dressing as directed. Woven Gauze Sponge, Non-Sterile 4x4 in Discharge Instruction: Apply over primary dressing as directed. Secured With 23M Medipore H Soft Cloth Surgical T ape, 4 x 10 (in/yd) Discharge Instruction: Secure with tape as directed. Compression Wrap Compression Stockings Add-Ons Electronic Signature(s) Signed: 07/21/2022 5:08:57 PM By: [name] Schwalbe RN Entered By: [name] on 07/21/2022 09:39:00 [name] ([id number]) [id number] Page 8 of 8 -------------------------------------------------------------------------------- Vitals Details Patient Name: Date of Service: [name], [name] 07/21/2022 9:15 A M Medical Record Number: [id number] Patient Account Number: [id number] Date of Birth/Sex: Treating RN: 10-May-1976 (46 y.o. [name] Primary Care Syriah Delisi: [name] Other Clinician: Dayton [name] Referring Joice Nazario: Treating Diago Haik/Extender: [name] in Treatment: 105 Vital Signs Time Taken: 09:20 Temperature (F): 97.8 Height (in): 69 Pulse (bpm): 76 Weight (lbs): 280 Respiratory Rate (breaths/min): 20 Body Mass Index (BMI): 41.3 Blood Pressure (mmHg): 125/62 Reference Range: 80 -  120 mg / dl Electronic Signature(s) Signed: 07/21/2022 11:12:47 AM By: [name] Entered By: [name] on 07/21/2022 09:23:20

## 2022-07-22 ENCOUNTER — Ambulatory Visit (HOSPITAL_BASED_OUTPATIENT_CLINIC_OR_DEPARTMENT_OTHER): Payer: PPO | Admitting: General Surgery

## 2022-07-24 ENCOUNTER — Other Ambulatory Visit: Payer: Self-pay | Admitting: Behavioral Health

## 2022-07-25 ENCOUNTER — Encounter: Payer: PPO | Attending: Physical Medicine and Rehabilitation | Admitting: Registered Nurse

## 2022-07-25 ENCOUNTER — Encounter: Payer: Self-pay | Admitting: Registered Nurse

## 2022-07-25 VITALS — BP 139/83 | HR 70 | Ht 68.0 in | Wt 312.0 lb

## 2022-07-25 DIAGNOSIS — Z5181 Encounter for therapeutic drug level monitoring: Secondary | ICD-10-CM | POA: Diagnosis not present

## 2022-07-25 DIAGNOSIS — G894 Chronic pain syndrome: Secondary | ICD-10-CM | POA: Insufficient documentation

## 2022-07-25 DIAGNOSIS — Z993 Dependence on wheelchair: Secondary | ICD-10-CM | POA: Insufficient documentation

## 2022-07-25 DIAGNOSIS — G6281 Critical illness polyneuropathy: Secondary | ICD-10-CM | POA: Diagnosis not present

## 2022-07-25 DIAGNOSIS — Z79899 Other long term (current) drug therapy: Secondary | ICD-10-CM | POA: Insufficient documentation

## 2022-07-25 MED ORDER — OXYCODONE HCL 5 MG PO TABS: 5.0000 mg | ORAL_TABLET | Freq: Four times a day (QID) | ORAL | 0 refills | Status: DC | PRN

## 2022-07-25 NOTE — Progress Notes (Signed)
Subjective:    Patient ID: [name], male    DOB: 1976-02-12, 46 y.o.   MRN: [id number]  HPI: [name] is a 46 y.o. male who returns for follow up appointment for chronic pain and medication refill. He states his pain is located in his bilateral feet. Pain, wound care following.  He rates his pain 4. His current exercise regime is walking short distances with walker.   Mr. Ruark Morphine equivalent is 15.00 MME.       Pain Inventory Average Pain 4 Pain Right Now 4 My pain is intermittent, constant, sharp, stabbing, and aching  In the last 24 hours, has pain interfered with the following? General activity 6 Relation with others 5 Enjoyment of life 7 What TIME of day is your pain at its worst? evening Sleep (in general) Fair  Pain is worse with: walking and standing Pain improves with: medication Relief from Meds: 6  Family History  Problem Relation Age of Onset   Asthma Brother    Diabetes Father    Hypertension Father    Diabetes Paternal Grandmother    Hypertension Paternal Grandmother    Migraines Mother    GER disease Mother    Pancreatic cancer Maternal Grandmother    Heart disease Maternal Grandfather    Social History   Socioeconomic History   Marital status: Married    Spouse name: Not on file   Number of children: 1   Years of education: Not on file   Highest education level: Not on file  Occupational History   Occupation: delivery driver  Tobacco Use   Smoking status: Never   Smokeless tobacco: Never  Vaping Use   Vaping Use: Never used  Substance and Sexual Activity   Alcohol use: Yes    Comment: rarely 2-3 times a year   Drug use: No   Sexual activity: Yes    Partners: Female  Other Topics Concern   Not on file  Social History Narrative   Not on file   Social Determinants of Health   Financial Resource Strain: Not on file  Food Insecurity: Not on file  Transportation Needs: Not on file  Physical Activity: Not on file   Stress: Not on file  Social Connections: Not on file   Past Surgical History:  Procedure Laterality Date   CANNULATION FOR ECMO (EXTRACORPOREAL MEMBRANE OXYGENATION) N/A 08/28/2019   Procedure: CANNULATION FOR VV ECMO (EXTRACORPOREAL MEMBRANE OXYGENATION);  Surgeon: Donata Clay, Theron Arista, MD;  Location: [facility] OR;  Service: Open Heart Surgery;  Laterality: N/A;  CRESCENT CANNULA   CANNULATION FOR ECMO (EXTRACORPOREAL MEMBRANE OXYGENATION) N/A 09/10/2019   Procedure: CANNULATION FOR ECMO (EXTRACORPOREAL MEMBRANE OXYGENATION) PUTTING IN CRESCENT 32FR CANNULA  AND REMOVING GROING CANNULATION;  Surgeon: Linden Dolin, MD;  Location: [facility] OR;  Service: Open Heart Surgery;  Laterality: N/A;  PUTTING IN CRESCENT/REMOVING GROIN CANNULATION   CYSTOSCOPY/URETEROSCOPY/HOLMIUM LASER/STENT PLACEMENT Left 04/18/2019   Procedure: LEFT URETEROSCOPY/HOLMIUM LASER/STENT PLACEMENT;  Surgeon: Crist Fat, MD;  Location: [facility];  Service: Urology;  Laterality: Left;   CYSTOSCOPY/URETEROSCOPY/HOLMIUM LASER/STENT PLACEMENT Left 05/02/2019   Procedure: CYSTOSCOPY/URETEROSCOPY/HOLMIUM LASER/STENT EXCHANGE;  Surgeon: Crist Fat, MD;  Location: [facility];  Service: Urology;  Laterality: Left;   ECMO CANNULATION N/A 08/03/2019   Procedure: ECMO CANNULATION;  Surgeon: Linden Dolin, MD;  Location: [facility];  Service: Cardiovascular;  Laterality: N/A;   ESOPHAGOGASTRODUODENOSCOPY N/A 09/11/2019   Procedure: ESOPHAGOGASTRODUODENOSCOPY (EGD);  Surgeon: Linden Dolin, MD;  Location: [facility] OR;  Service: Thoracic;  Laterality: N/A;   GRAFT APPLICATION Bilateral 03/27/2020   Procedure: APPLICATION OF SKIN GRAFT BILATERAL FEET;  Surgeon: Felecia Shelling, DPM;  Location: [facility];  Service: Podiatry;  Laterality: Bilateral;   IR REPLC GASTRO/COLONIC TUBE PERCUT W/FLUORO  10/14/2019   IRRIGATION AND DEBRIDEMENT SHOULDER Right 09/29/2017   Procedure: IRRIGATION AND DEBRIDEMENT SHOULDER;  Surgeon: Tarry Kos, MD;  Location: [facility] OR;  Service: Orthopedics;  Laterality: Right;   LUMBAR DISC SURGERY  2002   NASAL ENDOSCOPY WITH EPISTAXIS CONTROL N/A 08/31/2019   Procedure: NASAL ENDOSCOPY WITH EPISTAXIS CONTROL WITH CAUTERIZATION;  Surgeon: Christia Reading, MD;  Location: [facility] OR;  Service: ENT;  Laterality: N/A;   PORTACATH PLACEMENT N/A 09/11/2019   Procedure: PEG TUBE INSERTION - BEDSIDE;  Surgeon: Linden Dolin, MD;  Location: [facility];  Service: Thoracic;  Laterality: N/A;   TEE WITHOUT CARDIOVERSION N/A 08/28/2019   Procedure: TRANSESOPHAGEAL ECHOCARDIOGRAM (TEE);  Surgeon: Donata Clay, Theron Arista, MD;  Location: [facility] OR;  Service: Open Heart Surgery;  Laterality: N/A;   TEE WITHOUT CARDIOVERSION N/A 09/10/2019   Procedure: TRANSESOPHAGEAL ECHOCARDIOGRAM (TEE);  Surgeon: Linden Dolin, MD;  Location: [facility] OR;  Service: Open Heart Surgery;  Laterality: N/A;   WOUND DEBRIDEMENT Bilateral 03/27/2020   Procedure: EXCISIONAL DEBRIDEMENT OF ULCERS BILATERAL FEET;  Surgeon: Felecia Shelling, DPM;  Location: [facility];  Service: Podiatry;  Laterality: Bilateral;   Past Surgical History:  Procedure Laterality Date   CANNULATION FOR ECMO (EXTRACORPOREAL MEMBRANE OXYGENATION) N/A 08/28/2019   Procedure: CANNULATION FOR VV ECMO (EXTRACORPOREAL MEMBRANE OXYGENATION);  Surgeon: Donata Clay, Theron Arista, MD;  Location: [facility] OR;  Service: Open Heart Surgery;  Laterality: N/A;  CRESCENT CANNULA   CANNULATION FOR ECMO (EXTRACORPOREAL MEMBRANE OXYGENATION) N/A 09/10/2019   Procedure: CANNULATION FOR ECMO (EXTRACORPOREAL MEMBRANE OXYGENATION) PUTTING IN CRESCENT 32FR CANNULA  AND REMOVING GROING CANNULATION;  Surgeon: Linden Dolin, MD;  Location: [facility];  Service: Open Heart Surgery;  Laterality: N/A;  PUTTING IN CRESCENT/REMOVING GROIN CANNULATION   CYSTOSCOPY/URETEROSCOPY/HOLMIUM LASER/STENT PLACEMENT Left 04/18/2019   Procedure: LEFT URETEROSCOPY/HOLMIUM LASER/STENT PLACEMENT;  Surgeon: Crist Fat, MD;  Location: [facility];  Service: Urology;  Laterality: Left;   CYSTOSCOPY/URETEROSCOPY/HOLMIUM LASER/STENT PLACEMENT Left 05/02/2019   Procedure: CYSTOSCOPY/URETEROSCOPY/HOLMIUM LASER/STENT EXCHANGE;  Surgeon: Crist Fat, MD;  Location: [facility];  Service: Urology;  Laterality: Left;   ECMO CANNULATION N/A 08/03/2019   Procedure: ECMO CANNULATION;  Surgeon: Linden Dolin, MD;  [facility]: [facility];  Service: Cardiovascular;  Laterality: N/A;   ESOPHAGOGASTRODUODENOSCOPY N/A 09/11/2019   Procedure: ESOPHAGOGASTRODUODENOSCOPY (EGD);  Surgeon: Linden Dolin, MD;  Location: [facility] OR;  Service: Thoracic;  Laterality: N/A;   GRAFT APPLICATION Bilateral 03/27/2020   Procedure: APPLICATION OF SKIN GRAFT BILATERAL FEET;  Surgeon: Felecia Shelling, DPM;  Location: [facility];  Service: Podiatry;  Laterality: Bilateral;   IR REPLC GASTRO/COLONIC TUBE PERCUT W/FLUORO  10/14/2019   IRRIGATION AND DEBRIDEMENT SHOULDER Right 09/29/2017   Procedure: IRRIGATION AND DEBRIDEMENT SHOULDER;  Surgeon: Tarry Kos, MD;  Location: [facility] OR;  Service: Orthopedics;  Laterality: Right;   LUMBAR DISC SURGERY  2002   NASAL ENDOSCOPY WITH EPISTAXIS CONTROL N/A 08/31/2019   Procedure: NASAL ENDOSCOPY WITH EPISTAXIS CONTROL WITH CAUTERIZATION;  Surgeon: Christia Reading, MD;  Location: [facility] OR;  Service: ENT;  Laterality: N/A;   PORTACATH PLACEMENT N/A 09/11/2019   Procedure: PEG TUBE INSERTION - BEDSIDE;  Surgeon: Linden Dolin, MD;  Location: [facility] OR;  Service: Thoracic;  Laterality: N/A;   TEE WITHOUT CARDIOVERSION N/A 08/28/2019   Procedure: TRANSESOPHAGEAL ECHOCARDIOGRAM (TEE);  Surgeon: Donata Clay, Theron Arista, MD;  Location: [facility] OR;  Service: Open Heart Surgery;  Laterality: N/A;   TEE WITHOUT CARDIOVERSION N/A 09/10/2019   Procedure: TRANSESOPHAGEAL ECHOCARDIOGRAM (TEE);  Surgeon: Linden Dolin, MD;  Location: [facility] OR;  Service: Open Heart Surgery;  Laterality: N/A;   WOUND DEBRIDEMENT Bilateral 03/27/2020   Procedure: EXCISIONAL  DEBRIDEMENT OF ULCERS BILATERAL FEET;  Surgeon: Felecia Shelling, DPM;  Location: [facility];  Service: Podiatry;  Laterality: Bilateral;   Past Medical History:  Diagnosis Date   Anginal pain (HCC)    with covid   Anxiety    Asthma    Dyspnea    GERD (gastroesophageal reflux disease)    Headache    History of kidney stones    LEFT URETERAL STONE   HTN (hypertension)    Pancreatitis 2018   GALLBALDDER SLUDGE CAUSED ISSUED RESOLVED   Pneumonia 07/2019   covid   BP (!) 146/75   Pulse 79   Ht 5\' 8"  (1.727 m)   Wt (!) 312 lb (141.5 kg)   SpO2 95%   BMI 47.44 kg/m   Opioid Risk Score:   Fall Risk Score:  `1  Depression screen [facility] 2/9     07/25/2022    9:46 AM 04/01/2022    9:53 AM 10/25/2021    1:45 PM 07/21/2021    3:12 PM 06/04/2021    1:53 PM 03/03/2021   10:08 AM 01/01/2021    9:26 AM  Depression screen PHQ 2/9  Decreased Interest 1 1   1 1  0  Down, Depressed, Hopeless 1 1   1 1  0  PHQ - 2 Score 2 2   2 2  0  Altered sleeping         Tired, decreased energy         Change in appetite         Feeling bad or failure about yourself          Trouble concentrating         Moving slowly or fidgety/restless         Suicidal thoughts         PHQ-9 Score            Information is confidential and restricted. Go to Review Flowsheets to unlock data.    Review of Systems  Musculoskeletal:  Positive for arthralgias, back pain and gait problem.       Pain in both knees, Both shoulder, both feet and both hands  All other systems reviewed and are negative.      Objective:   Physical Exam Vitals and nursing note reviewed.  Constitutional:      Appearance: Normal appearance.  Cardiovascular:     Rate and Rhythm: Normal rate and regular rhythm.     Pulses: Normal pulses.     Heart sounds: Normal heart sounds.  Pulmonary:     Effort: Pulmonary effort is normal.     Breath sounds: Normal breath sounds.  Musculoskeletal:     Cervical back: Normal range of motion and neck  supple.     Comments: Normal Muscle Bulk and Muscle Testing Reveals:  Upper Extremities: Full ROM and Muscle Strength 5/5  Lower Extremities: Full ROM and Muscle Strength 5/5     Skin:    General: Skin is warm and dry.  Neurological:  Mental Status: He is alert and oriented to person, place, and time.  Psychiatric:        Mood and Affect: Mood normal.        Behavior: Behavior normal.          Assessment & Plan:  Critical Illness Polyneuropathy: Continue current medication regimen. Continue to Monitor.  Chronic Pain Syndrome: Refilled: Oxycodone 5 mg one tablet twice a day as needed for pain #60. We will continue the opioid monitoring program, this consists of regular clinic visits, examinations, urine drug screen, pill counts as well as use of [place] Controlled Substance Reporting system. A 12 month History has been reviewed on the [place] Controlled Substance Reporting System on 07/25/2022.  Wheelchair Dependence: Continue to Monitor.   F/U with Dr Berline Chough

## 2022-07-26 NOTE — Progress Notes (Signed)
[name] ([id number]) [id number] Page 1 of 12 Visit Report for 07/21/2022 HPI Details Patient Name: Date of Service: [name], [place] 07/21/2022 9:15 A M Medical Record Number: [id number] Patient Account Number: [id number] Date of Birth/Sex: Treating RN: December 05, 1975 (46 y.o. [name] Primary Care Provider: [name] Other Clinician: Worthy Rancher Referring Provider: Treating Provider/Extender: [name] in Treatment: 105 History of Present Illness HPI Description: Wounds are12/03/2020 upon evaluation today patient presents for initial inspection here in our clinic concerning issues he has been having with the bottoms of his feet bilaterally. He states these actually occurred as wounds when he was hospitalized for 5 months secondary to Covid. He was apparently with tilting bed where he was in an upright position quite frequently and apparently this occurred in some way shape or form during that time. Fortunately there is no sign of active infection at this time. No fevers, chills, nausea, vomiting, or diarrhea. With that being said he still has substantial wounds on the plantar aspects of his feet Theragen require quite a bit of work to get these to heal. He has been using Santyl currently though that is been problematic both in receiving the medication as well as actually paid for it as it is become quite expensive. Prior to the experience with Covid the patient really did not have any major medical problems other than hypertension he does have some mild generalized weakness following the Covid experience. 07/22/2020 on evaluation today patient appears to be doing okay in regard to his foot ulcers I feel like the wound beds are showing signs of better improvement that I do believe the Iodoflex is helping in this regard. With that being said he does have a lot of drainage currently and this is somewhat blue/green in nature  which is consistent with Pseudomonas. I do think a culture today would be appropriate for [place] to evaluate and see if that is indeed the case I would likely start him on antibiotic orally as well he is not allergic to Cipro knows of no issues he has had in the past 12/21; patient was admitted to the clinic earlier this month with bilateral presumed pressure ulcers on the bottom of his feet apparently related to excessive pressure from a tilt table arrangement in the intensive care unit. Patient relates this to being on ECMO but I am not really sure that is exactly related to that. I must say I have never seen anything like this. He has fairly extensive full-thickness wounds extending from his heel towards his midfoot mostly centered laterally. There is already been some healing distally. He does not appear to have an arterial issue. He has been using gentamicin to the wound surfaces with Iodoflex to help with ongoing debridement 1/6; this is a patient with pressure ulcers on the bottom of his feet related to excessive pressure from a standing position in the intensive care unit. He is complaining of a lot of pain in the right heel. He is not a diabetic. 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 We have been using Iodoflex to help prepare the surfaces of both wounds for an advanced treatment product. He is nonambulatory spending most of his time in a wheelchair I have asked him not to propel the wheelchair with his heels 1/13; in general his wounds look better not much surface area change we have been using Iodoflex as of last week. I did an x-ray of the right heel  as the patient was complaining of pain. I had some thoughts about a stress fracture perhaps Achilles tendon problems however what it showed was erosive changes along the inferior aspect of the calcaneus he now has a MRI booked for 1/20. 1/20; in general his wounds continue to be better. Some improvement in the large  narrow areas proximally in his foot. He is still complaining of pain in the right heel and tenderness in certain areas of this wound. His MRI is tonight. I am not just looking for osteomyelitis that was brought up on the x-ray I am wondering about stress fractures, tendon ruptures etc. He has no such findings on the left. Also noteworthy is that the patient had critical illness neuropathy and some of the discomfort may be actual improvement in nerve function I am just not sure. These wounds were initially in the setting of severe critical illness related to COVID-19. He was put in a standing position. He may have also been on pressors at the point contributing to tissue ischemia. By his description at some point these wounds were grossly necrotic extending proximally up into the Achilles part of his heel. I do not know that I have ever really seen pictures of them like this although they may exist in epic We have ordered Tri layer Oasis. I am trying to stimulate some granulation in these areas. This is of course assuming the MRI is negative for infection 1/27; since the patient was last here he saw Dr. Juleen China of infectious disease. He is planned for vancomycin and ceftriaxone. Prior operative culture grew MSSA. Also ordered baseline lab work. He also ordered arterial studies although the ABIs in our clinic were normal as well as his clinical exam these were normal I do not think he needs to see vascular surgery. His ABIs at the [facility] were 1.22 in the right triphasic waveforms with a normal TBI of 1.15 on the left ABI of 1.22 with triphasic waveforms and a normal TBI of 1.08. Finally he saw Dr. Amalia Hailey who will follow him in for 2 months. At this point I do not think he felt that he needed a procedure on the right calcaneal bone. Dr. Juleen China is elected for broad-spectrum antibiotic The patient is still having pain in the right heel. He walks with a walker 2/3; wounds are generally smaller. He is  tolerating his IV antibiotics. I believe this is vancomycin and ceftriaxone. We are still waiting for Oasis burn in terms of his out-of-pocket max which he should be meeting soon given the IV antibiotics, MRIs etc. I have asked him to check in on this. We are using silver collagen in the meantime the wounds look better 2/10; tolerating IV vancomycin and Rocephin. We are waiting to apply for Oasis. Although I am not really sure where he is in his out-of-pocket max. 2/17 started the first application of Oasis trilayer. Still on antibiotics. The wounds have generally look better. The area on the left has a little more surface slough requiring debridement 1/42; second application of Oasis trilayer. The wound surface granulation is generally look better. The area on the left with undermining laterally I think is come in a bit. 10/08/2020 upon evaluation today patient is here today for [employer] application #3. Fortunately he seems to be doing extremely well with regard to this and we are seeing a lot of new epithelial growth which is great news. Fortunately there is no signs of active infection at this time. 10/16/2020 upon evaluation today  patient appears to be doing well with regard to his foot ulcers. Do believe the Oasis has been of benefit for him. I do not see any signs of infection right now which is great news and I think that he has a lot of new epithelial growth which is great to see as well. The patient is very pleased to hear all of this. I do think we can proceed with the Oasis trilayer #4 today. 3/18; not as much improvement in these areas on his heels that I was hoping. I did reapply trilateral Oasis today the tissue looks healthier but not as much fill KIA, STAVROS ([id number]) [id number] Page 2 of 12 in as I was hoping. 3/25; better looking today I think this is come in a bit the tissue looks healthier. Triple layer Oasis reapplied #6 4/1; somewhat better  looking definitely better looking surface not as much change in surface area as I was hoping. He may be spending more time [place] on days then he needs to although he does have heel offloading boots. Triple layer Oasis reapplied #7 4/7; unfortunately apparently [facility] will not approve any further Oasis which is unfortunate since the patient did respond nicely both in terms of the condition of the wound bed as well as surface area. There is still some drainage coming from the wound but not a lot there does not appear to be any infection 4/15; we have been using Hydrofera Blue. He continues to have nice rims of epithelialization on the right greater than the left. The left the epithelialization is coming from the tip of his heel. There is moderate drainage. In this that concerns me about a total contact cast. There is no evidence of infection 4/29; patient has been using Hydrofera Blue with dressing changes. He has no complaints or issues today. 5/5; using Hydrofera Blue. I actually think that he looks marginally better than the last time I saw this 3 weeks ago. There are rims of epithelialization on the left thumb coming from the medial side on the right. Using Hydrofera Blue 5/12; using Hydrofera Blue. These continue to make improvements in surface area. His drainage was not listed as severe I therefore went ahead and put a cast on the left foot. Right foot we will continue to dress his previous 5/16; back for first total contact cast change. He did not tolerate this particularly well cast injury on the anterior tibia among other issues. Difficulty sleeping. I talked him about this in some detail and afterwards is elected to continue. I told him I would like to have a cast on for 3 weeks to see if this is going to help at all. I think he agreed 5/19; I think the wound is better. There is no tunneling towards his midfoot. The undermining medially also looks better. He has a rim of new  skin distally. I think we are making progress here. The area on the left also continues to look somewhat better to me using Hydrofera Blue. He has a list of complaints about the cast but none of them seem serious 5/26; patient presents for 1 week follow-up. He has been using a total contact cast and tolerating this well. Hydrofera Blue is the main dressing used. He denies signs of infection. 6/2 Hydrofera Blue total contact cast on the left. These were large ulcers that formed in intensive care unit where the patient was recovering from [place]. May have had something to do with being ventilated in  an upright positiono Pressors etc. We have been able to get the areas down considerably and a viable surface. There is some epithelialization in both sides. Note made of drainage 6/9; changed to [facility] last time because of drainage. He arrives with better looking surfaces and dimensions on the left than the right. Paradoxically the right actually probes more towards his midfoot the left is largely close down but both of these look improved. Using a total contact cast on the left 6/16; complex wounds on his bilateral plantar heels which were initially pressure injury from a stay in the ICU with COVID. We have been using silver alginate most recently. His dimensions of come in quite dramatically however not recently. We have been putting the left foot in a total contact cast 6/23; complex wounds on the bilateral plantar heels. I been putting the left in the cast paradoxically the area on the right is the one that is going towards closure at a faster rate. Quite a bit of drainage on the left. The patient went to see Dr. Amalia Hailey who said he was going to standby for skin grafts. I had actually considered sending him for skin grafts however he would be mandatorily off his feet for a period of weeks to months. I am thinking that the area on the right is going to close on its own the area on the left has been  more stubborn even though we have him in a total contact cast 6/30; took him out of a total contact cast last week is the right heel seem to be making better progress than the left where I was placing the cast. We are using silver alginate. Both wounds are smaller right greater than left 7/12; both wounds look as though they are making some progress. We are using silver alginate. Heel offloading boots 7/26; very gradual progress especially on the right. Using silver alginate. He is wearing heel offloading boots 8/18; he continues to close these wounds down very gradually. Using silver alginate. The problem polymen being definitive about this is areas of what appears to be callus around the margins. This is not a 100% of the area but certainly sizable especially on the right 9/1; bilateral plantar feet wounds secondary to prolonged pressure while being ventilated for COVID-19 in an upright position. Essentially pressure ulcers on the bottom of his feet. He is made substantial progress using silver alginate. 9/14; bilateral plantar feet wounds secondary to prolonged pressure. Making progress using silver alginate. 9/29 bilateral plantar feet wounds secondary to prolonged pressure. I changed him to Iodoflex last week. MolecuLight showing reddened blush fluorescence 10/11; patient presents for follow-up. He has no issues or complaints today. He denies signs of infection. He continues to use Iodoflex and antibiotic ointment to the wound beds. 10/27; 2-week follow-up. No evidence of infection. He has callus and thick dry skin around the wound margins we have been using Iodoflex and Bactroban which was in response to a moderate left MolecuLight reddish blush fluorescence. 11/10; 2-week follow-up. Wound margins again have thick callus however the measurements of the actual wound sites are a lot smaller. Everything looks reasonably healthy here. We have been using Iodoflex He was approved for prime matrix  but I have elected to delay this given the improvement in the surface area. Hopefully I will not regret that decision as were getting close to the end of the year in terms of insurance payment 12/8; 2-week follow-up. Wounds are generally smaller in size. These were initially  substantial wounds extending into the forefoot all the way into the heel on the bilateral plantar feet. They are now both located on the plantar heel distal aspect both of these have a lot of callus around the wounds I used a #5 curette to remove this on the right and the left also some subcutaneous debris to try and get the wound edges were using Iodoflex. He has heel offloading shoe 12/22; 2-week follow-up. Not really much improvement. He has thick callus around the outer edges of both wounds. I remove this there is some nonviable subcutaneous tissue as well. We have been using Iodoflex. Her intake nurse and myself spontaneously thought of a total contact cast I went back in May. At that time we really were not seeing much of an improvement with a cast although the wound was in a much different situation I would like to retry this in 2 weeks and I discussed this with the patient 08/12/2021; the patient has had some improvement with the Iodoflex. The the area on the left heel plantar more improved than the right. I had to put him in a total contact cast on the left although I decided to put that off for 2 weeks. I am going to change his primary dressing to silver collagen. I think in both areas he has had some improvement most of the healing seems to be more proximal in the heel. The wounds are in the mid aspect. A lot of thick callus on the right heel however. 1/19; we are using silver collagen on both plantar heel areas. He has had some improvement today. The left did not require any debridement. He still had some eschar on the right that was debrided but both seem to have contracted. I did not put it total contact cast on him  today 2/2 we have been using silver collagen. The area on the right plantar heel has areas that appear to be epithelialized interspersed with dry flaking callus and dry skin. I removed this. This really looks better than on the other side. On the left still a large area with raised edges and debris on the surface. 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 He has no issues or complaints today. 2/9; not much change in the left foot wound with 1 week of a cast we are using silver collagen. Silver collagen on the right side. The right side has been the better wound surface. We will reapply the total contact cast on the left 2/16; not much improvement on either side I been using silver collagen with a total contact cast on the left. I'm changing the Hydrofera Blue still with a total contact cast on the left 2/23; some improvement on both sides. Disappointing that he has thick callus around the area that we are putting in a total contact cast on the left. We've been using Hydrofera Blue on both wound areas. This is a man who at essentially pressure ulcers in addition to ischemia caused by medications to support his blood pressure (pressors) in the ICU. He was being ventilated in the standing position for severe Covid. A Shiley the wounds extended across his entire foot but are now localized to his plantar heels bilaterally. We have made progress however neither areas healed. I continue to think the total contact cast is helped albeit painstakingly slowly. He has never wanted  a plastic surgery consult although I don't know that they would be interested in grafting in area in this location. 10/07/2021: Continued improvement bilaterally. There is still some callus around the left wound, despite the total contact cast. He has some increased pain in his right midfoot around 1  particular area. This has been painful in the past but seems to be a little bit worse. When his cast was removed today, there was an area on the heel of the left foot that looks a bit macerated. He is also complaining of pain in his left thigh and hip which he thinks is secondary to the limb length KEO, SCHIRMER ([id number]) [phone].pdf Page 3 of 12 discrepancy caused by the cast. 10/14/2021: He continues to improve. A little bit less callus around the left wound. He continues to endorse pain in his right midfoot, but this is not as significant as it was last week. The maceration on his left heel is improved. 10/21/2021: Continued improvement to both wounds. The maceration on his left heel is no longer evident. Less callus bilaterally. Epithelialization progressing. 10/28/2021: Significant improvement this week. The right sided wound is nearly closed with just a small open area at the middle. No maceration seen on the left heel. Continued epithelialization on both sides. No concern for infection. 11/04/2021: T oday, the wounds were measured a little bit differently and come out as larger, but I actually think they are about the same to potentially even smaller, particularly on the left. He continues to accumulate some callus on the right. 11/11/2021: T oday, the patient is expressing some concern that the left wound, despite being in the total contact cast, is not progressing at the same rate as the 1 on the right. He is interested in trying a week without the cast to see how the wound does. The wounds are roughly the same size as last week, with the right perhaps being a little bit smaller. He continues to build up callus on both sites. 11/18/2021: Last week, I permitted the patient to go without his total contact cast, just to see if the cast was really making any difference. Today, both wounds have deteriorated to some extent, suggesting that the cast is providing benefit, at  least on the left. Both are larger and have accumulated callus, slough, and other debris. 11/26/2021: I debrided both wounds quite aggressively last week in an effort to stimulate the healing cascade. This appears to have been effective as the left sided wound is a full centimeter shorter in length. Although the right was measured slightly larger, on inspection, it looks as though an area of epithelialized tissue was included in the measurements. We have been using PolyMem Ag on the wound surfaces with a total contact cast to the left. 12/02/2021: It appears that the intake personnel are including epithelialized tissue in his wound measurements; the right wound is almost completely epithelialized; there is just a crater at the proximal midfoot with some open areas. On the left, he has built up some callus, but the overall wound surface looks good. There is some senescent skin around the wound margin. He has been in PolyMem Ag bilaterally with a total contact cast on the left. 12/09/2021: The right wound is nearly closed; there is just a small open area at the mid calcaneus. On the left, the wound is smaller with minimal callus buildup. No significant drainage. 12/16/2021: The right calcaneal wound remains minimally open at the mid calcaneus; the rest has epithelialized. On  the left, the wound is also a little bit smaller. There is some senescent tissue on the periphery. He is getting his first application of a trial skin substitute called Vendaje today. 12/23/2021: The wound on his right calcaneus is nearly closed; there is just a small area at the most distal aspect of the calcaneus that is open. On the left, the area where we applied to the skin substitute has a healthier-looking bed of granulation tissue. The wound dimensions are not significantly different on this side but the wound surface is improved. 12/30/2021: The wound on the right calcaneus has not changed significantly aside from some  accumulation of callus. On the left, the open area is smaller and continues to have an improved surface. He continues to accumulate callus around the wound. He is here for his third application of Vendaje. 01/06/2022: The right calcaneal wound is down to just a couple of millimeters. He continues to accumulate periwound callus. He unfortunately got his cast wet earlier in the week and his left foot is macerated, resulting in some superficial skin loss just distal to the open wound. The open wound itself, however, is much smaller and has a healthier appearing surface. He is here for his fourth application of Vendaje. 01/13/2022: The right calcaneal wound is about the same. Unfortunately, once again, his cast got wet and his foot is again macerated. This is caused the left calcaneal wound to enlarge. He is here for his fifth application of Vendaje. 01/20/2022: The right calcaneal wound is very small. There is some periwound callus accumulation. He purchased a new cast protector last week and this has been effective in avoiding the maceration that has been occurring on the left. The left calcaneal wound is narrower and has a healthy and viable-appearing surface. He is here for his 6 application of Vendaje. 01/27/2022: The right calcaneal wound is down to just a pinhole. There is some periwound slough and callus. On the left, the wound is narrower and shorter by about a centimeter. The surface is robust and viable-appearing. Unfortunately, the rep for the trial skin substitute product did not provide any for [place] to use today. 02/04/2022: The right calcaneal wound remains unchanged. There is more accumulated callus. On the left, although the intake nurse measured it a little bit longer, it looks about the same to me. The surface has a layer of slough, but underneath this, there is good granulation tissue. 02/10/2022: The right calcaneus wound is nearly closed. There is still some callus that builds up around the  site. The left side looks about the same in terms of dimensions, but the surface is more robust and vital-appearing. 02/16/2022: The area of the right calcaneus that was nearly closed last week has closed, but there is a small opening at the mid foot where it looks like some moisture got retained and caused some reopening. The left foot wound is narrower and shallower. Both sites have a fair amount of periwound callus and eschar. 02/24/2022: The small midfoot opening on the right calcaneus is a little bit smaller today. The left foot wound is narrower and shallower. He continues to accumulate periwound callus. No concern for infection. 03/01/2022: The patient came to clinic early because he showered and got his cast wet. Fortunately, there is no significant maceration to his foot but the callus softened and it looks like the wound on his left calcaneus may be a little bit wider. The wound on his right calcaneus is just a narrow slit. Continued  accumulation of periwound callus bilaterally. 03/08/2022: The wound on his right calcaneus is very nearly closed, just a small pinpoint opening under a bit of eschar; the left wound has come in quite a bit, as well. It is narrower and shorter than at our last visit. Still with accumulated callus and eschar bilaterally. 03/17/2022: The right calcaneal wound is healed. The left wound is smaller and the surface itself is very clean, but there is some blue-green staining on the periwound callus, concerning for Pseudomonas aeruginosa. 03/23/2022: The right calcaneal wound remains closed. The left wound continues to contract. No further blue-green staining. Small amount of callus and slough accumulation. 03/28/2022: He came in early today because he had gotten his cast wet. On inspection, the wound itself did not get wet or macerated, just a little bit of the forefoot. The wound itself is basically unchanged. 04/07/2022: The right foot wound remains closed. The left wound is  the smallest that I have seen it to date. It is narrower and shorter. It still continues to accumulate slough on the surface. 04/15/2022: There is a band of epithelium now dividing the small left plantar foot wound in 2. There is still some slough on the surface. 04/21/2022: The wound continues to narrow. Just a little bit of slough on the surface. He seems to be responding well to endoform. FENDER, HERDER ([id number]) [id number] Page 4 of 12 04/28/2022: Continued slow contraction of the wound. There is a little slough on the surface and some periwound callus. We have been using endoform and total contact cast. 05/05/2022: The wound appears to have stalled. There is slough and some periwound eschar/callus. No concern for infection, however. 05/12/2022: Unfortunately, his right foot has reopened. It is located at the most posterior aspect of his surgical incision. The area was noted to have drainage coming from it when his padding was removed today. Underneath some callus and senescent skin, there is an opening. No purulent drainage or malodor. On the left foot, the wound is again unchanged. There is some light blue staining on the callus, but no malodor or purulent drainage. 10/13; right and left heel remanence of extensive plantar foot wounds. These are better than I remember by quite a big margin however he is still left with wounds on the left plantar heel and the right plantar heel. Been using endoform bilaterally. A culture was done that showed apparently Pseudomonas but we are still waiting for the [facility] antibiotic to use gentamicin today. He is still very active by description I am not sure about the offloading of his noncasted right foot 10/20; both wounds right and left heel debrided not much change from last week. [employer] has arrived which is linezolid, gentamicin and ciprofloxacin we will use this with endoform. T contact cast on the left otal 06/02/2022: Both  wounds are smaller today. There is still a fair amount of callus buildup around the right foot ulcer. The left is more superficial and nearly flush with the surrounding tissues. Also with slough and eschar buildup. 06/10/2022: The right sided wound appears to be nearly closed, if not completely so, although it is somewhat difficult to tell given the abnormal tissue and scarring in his foot. There is a fair amount of callus and crust accumulation. On the left, the wound looks about the same, again with callus and slough. He has an appointment next Thursday with Dr. Loel Lofty in podiatry; I am hopeful that there may be some reconstructive options available for Mr. Bruemmer. 06/16/2022: Both  wounds have some eschar and callus accumulation. The right sided wound is extremely narrow and barely open; the left is narrower than last week. There is a little bit of slough. He has his appointment in podiatry later today. 06/23/2022: The patient met with Dr. Loel Lofty last week and unfortunately, there are no reconstructive options that he believes would be helpful. He did order an MRI to evaluate for osteomyelitis and fortunately, none was seen. The left sided wound is a little bit shorter and narrower today. The right sided wound is about the same. There is callus and eschar accumulation bilaterally. 06/29/2022: Both feet have improved from last week. There is epithelium making a valiant effort to creep across the surface on the left. The right side looks like it got a little dry and the deep crevasse in his midfoot has cracked. Both have eschar and there is some slough on the left. 07/07/2022: Both feet have improved. There is epithelium completely covering the calcaneus on the right with just a small opening in the crevasse in his midfoot. On the left, the open area of tissue is smaller but he continues to build up callus/eschar and slough. 07/15/2022: The opening in the midfoot on the right is about the same  size, covered with eschar and a little bit of slough. The open portion of the left wound is narrower and shorter with a bit of slough buildup. He admits to being on his feet more than recommended. 12/14; as far as I can tell everything on the right foot is closed. There is some eschar I removed some of this I cannot identify any open wound here. As usual this will be a very vulnerable area going forward. On the left this looks really quite healthy. I was pleasantly surprised to see how good this looked. Wound is certainly smaller and there appears to be healthy epithelialization. He has been using Promogran on the right and endoform on the left. He has been offloading the right foot with a heel offloading boot and he has a running shoe on the right foot Electronic Signature(s) Signed: 07/21/2022 4:13:03 PM By: Linton Ham MD Entered By: Linton Ham on 07/21/2022 11:09:54 -------------------------------------------------------------------------------- Physical Exam Details Patient Name: Date of Service: [name], [place] 07/21/2022 9:15 A M Medical Record Number: [id number] Patient Account Number: [id number] Date of Birth/Sex: Treating RN: Apr 06, 1976 (46 y.o. [name] Primary Care Provider: [name] Other Clinician: Worthy Rancher Referring Provider: Treating Provider/Extender: [name] in Treatment: [phone] Cardiovascular The patient does not have an arterial issue.Marland Kitchen Notes Wound exam The mid heel wound on the right is eschar as far as I can tell there is no open wound here. He does have epithelization over bone and a large area here on the plantar heel On the left foot/plantar heel the wound is smaller there is no evidence of infection epithelialization is evident. No debridement was necessary Electronic Signature(s) Signed: 07/21/2022 4:13:03 PM By: Linton Ham MD Entered By: Linton Ham on 07/21/2022 11:12:59 [name] ([id number])  [id number] Page 5 of 12 -------------------------------------------------------------------------------- Physician Orders Details Patient Name: Date of Service: [name] 07/21/2022 9:15 A M Medical Record Number: [id number] Patient Account Number: [id number] Date of Birth/Sex: Treating RN: 12-06-1975 (46 y.o. [name] Primary Care Provider: [name] Other Clinician: Worthy Rancher Referring Provider: Treating Provider/Extender: [name] in Treatment: [phone] Verbal / Phone Orders: No Diagnosis Coding Follow-up Appointments ppointment in 1 week. -  Dr. [name] Room 3 Return A Anesthetic Wound #2 Left Calcaneus (In clinic) Topical Lidocaine 4% applied to wound bed - In clinic Bathing/ Shower/ Hygiene May shower with protection but do not get wound dressing(s) wet. - Cover with cast protector (can purchase cast protector at [facility] or [facility] (Cast on hold 06/23/22) Edema Control - Lymphedema / SCD / Other Bilateral Lower Extremities Elevate legs to the level of the heart or above for 30 minutes daily and/or when sitting, a frequency of: - throughout the day Avoid standing for long periods of time. Moisturize legs daily. - as needed Off-Loading Other: - keep pressure off of the bottom of your feet. Globoped to left foot-when not wearing the cast Elevate legs throughout the day Additional Orders / Instructions Follow Nutritious Diet Wound Treatment Wound #1 - Calcaneus Wound Laterality: Right Cleanser: Normal Saline (Generic) 1 x Per Day/30 Days Discharge Instructions: Cleanse the wound with Normal Saline prior to applying a clean dressing using gauze sponges, not tissue or cotton balls. Cleanser: Wound Cleanser (Generic) 1 x Per Day/30 Days Discharge Instructions: Cleanse the wound with wound cleanser prior to applying a clean dressing using gauze sponges, not tissue or cotton balls. Prim Dressing: Promogran Prisma  Matrix, 4.34 (sq in) (silver collagen) (DME) (Generic) 1 x Per Day/30 Days ary Discharge Instructions: Moisten collagen with saline or hydrogel Secondary Dressing: Optifoam Non-Adhesive Dressing, 4x4 in (DME) (Generic) 1 x Per Day/30 Days Discharge Instructions: Apply over primary dressing as directed. Secondary Dressing: Woven Gauze Sponge, Non-Sterile 4x4 in (DME) (Generic) 1 x Per Day/30 Days Discharge Instructions: Apply over primary dressing as directed. Secured With: Child psychotherapist, Sterile 2x75 (in/in) (Generic) 1 x Per Day/30 Days Discharge Instructions: Secure with stretch gauze as directed. Secured With: 460M Medipore H Soft Cloth Surgical T ape, 4 x 10 (in/yd) (Generic) 1 x Per Day/30 Days Discharge Instructions: Secure with tape as directed. Wound #2 - Calcaneus Wound Laterality: Left Cleanser: Normal Saline (Generic) 1 x Per Week/30 Days Discharge Instructions: Cleanse the wound with Normal Saline prior to applying a clean dressing using gauze sponges, not tissue or cotton balls. Cleanser: Wound Cleanser 1 x Per Week/30 Days Discharge Instructions: Cleanse the wound with wound cleanser prior to applying a clean dressing using gauze sponges, not tissue or cotton balls. Topical: keystone 1 x Per Week/30 Days Discharge Instructions: use on Left Calcaneus Prim Dressing: Promogran Prisma Matrix, 4.34 (sq in) (silver collagen) (DME) (Generic) 1 x Per Week/30 Days TARRIN, LEBOW ([id number]) [id number] Page 6 of 12 Discharge Instructions: Moisten collagen with saline or hydrogel Secondary Dressing: Optifoam Non-Adhesive Dressing, 4x4 in (DME) (Generic) 1 x Per Week/30 Days Discharge Instructions: Apply over primary dressing as directed. Secondary Dressing: Woven Gauze Sponge, Non-Sterile 4x4 in (DME) (Generic) 1 x Per Week/30 Days Discharge Instructions: Apply over primary dressing as directed. Secured With: 460M Medipore H Soft Cloth  Surgical T ape, 4 x 10 (in/yd) (DME) (Generic) 1 x Per Week/30 Days Discharge Instructions: Secure with tape as directed. Electronic Signature(s) Signed: 07/21/2022 5:08:57 PM By: [name] Signed: 07/26/2022 11:22:41 AM By: Fredirick Maudlin MD FACS Entered By: [name] on 07/21/2022 10:01:54 -------------------------------------------------------------------------------- Problem List Details Patient Name: Date of Service: [name], [place] 07/21/2022 9:15 A M Medical Record Number: [id number] Patient Account Number: [id number] Date of Birth/Sex: Treating RN: 28-Dec-1975 (46 y.o. [name] Primary Care Provider: [name] Other Clinician: Worthy Rancher Referring Provider: Treating Provider/Extender: [name] in Treatment:  105 Active Problems ICD-10 Encounter Code Description Active Date MDM Diagnosis L97.512 Non-pressure chronic ulcer of other part of right foot with fat layer exposed 09/03/2020 No Yes L97.522 Non-pressure chronic ulcer of other part of left foot with fat layer exposed 09/03/2020 No Yes Inactive Problems ICD-10 Code Description Active Date Inactive Date L89.893 Pressure ulcer of other site, stage 3 07/15/2020 07/15/2020 M62.81 Muscle weakness (generalized) 07/15/2020 07/15/2020 I10 Essential (primary) hypertension 07/15/2020 07/15/2020 M86.171 Other acute osteomyelitis, right ankle and foot 09/03/2020 09/03/2020 Resolved Problems Electronic Signature(s) Signed: 07/21/2022 4:13:03 PM By: Linton Ham MD Entered By: Linton Ham on 07/21/2022 11:05:16 [name] ([id number]) [id number] Page 7 of 12 -------------------------------------------------------------------------------- Progress Note Details Patient Name: Date of Service: [name] [place]. 07/21/2022 9:15 A M Medical Record Number: [id number] Patient Account Number: [id number] Date of Birth/Sex: Treating RN: 1976/05/13 (46  y.o. [name] Primary Care Provider: [name] Other Clinician: Worthy Rancher Referring Provider: Treating Provider/Extender: [name] in Treatment: 105 Subjective History of Present Illness (HPI) Wounds are12/03/2020 upon evaluation today patient presents for initial inspection here in our clinic concerning issues he has been having with the bottoms of his feet bilaterally. He states these actually occurred as wounds when he was hospitalized for 5 months secondary to Covid. He was apparently with tilting bed where he was in an upright position quite frequently and apparently this occurred in some way shape or form during that time. Fortunately there is no sign of active infection at this time. No fevers, chills, nausea, vomiting, or diarrhea. With that being said he still has substantial wounds on the plantar aspects of his feet Theragen require quite a bit of work to get these to heal. He has been using Santyl currently though that is been problematic both in receiving the medication as well as actually paid for it as it is become quite expensive. Prior to the experience with Covid the patient really did not have any major medical problems other than hypertension he does have some mild generalized weakness following the Covid experience. 07/22/2020 on evaluation today patient appears to be doing okay in regard to his foot ulcers I feel like the wound beds are showing signs of better improvement that I do believe the Iodoflex is helping in this regard. With that being said he does have a lot of drainage currently and this is somewhat blue/green in nature which is consistent with Pseudomonas. I do think a culture today would be appropriate for [place] to evaluate and see if that is indeed the case I would likely start him on antibiotic orally as well he is not allergic to Cipro knows of no issues he has had in the past 12/21; patient was admitted to the clinic earlier  this month with bilateral presumed pressure ulcers on the bottom of his feet apparently related to excessive pressure from a tilt table arrangement in the intensive care unit. Patient relates this to being on ECMO but I am not really sure that is exactly related to that. I must say I have never seen anything like this. He has fairly extensive full-thickness wounds extending from his heel towards his midfoot mostly centered laterally. There is already been some healing distally. He does not appear to have an arterial issue. He has been using gentamicin to the wound surfaces with Iodoflex to help with ongoing debridement 1/6; this is a patient with pressure ulcers on the bottom of his feet related to excessive pressure from a  standing position in the intensive care unit. He is complaining of a lot of pain in the right heel. He is not a diabetic. 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 We have been using Iodoflex to help prepare the surfaces of both wounds for an advanced treatment product. He is nonambulatory spending most of his time in a wheelchair I have asked him not to propel the wheelchair with his heels 1/13; in general his wounds look better not much surface area change we have been using Iodoflex as of last week. I did an x-ray of the right heel as the patient was complaining of pain. I had some thoughts about a stress fracture perhaps Achilles tendon problems however what it showed was erosive changes along the inferior aspect of the calcaneus he now has a MRI booked for 1/20. 1/20; in general his wounds continue to be better. Some improvement in the large narrow areas proximally in his foot. He is still complaining of pain in the right heel and tenderness in certain areas of this wound. His MRI is tonight. I am not just looking for osteomyelitis that was brought up on the x-ray I am wondering about stress fractures, tendon ruptures etc. He has no such findings on the  left. Also noteworthy is that the patient had critical illness neuropathy and some of the discomfort may be actual improvement in nerve function I am just not sure. These wounds were initially in the setting of severe critical illness related to COVID-19. He was put in a standing position. He may have also been on pressors at the point contributing to tissue ischemia. By his description at some point these wounds were grossly necrotic extending proximally up into the Achilles part of his heel. I do not know that I have ever really seen pictures of them like this although they may exist in epic We have ordered Tri layer Oasis. I am trying to stimulate some granulation in these areas. This is of course assuming the MRI is negative for infection 1/27; since the patient was last here he saw Dr. Juleen China of infectious disease. He is planned for vancomycin and ceftriaxone. Prior operative culture grew MSSA. Also ordered baseline lab work. He also ordered arterial studies although the ABIs in our clinic were normal as well as his clinical exam these were normal I do not think he needs to see vascular surgery. His ABIs at the P[facility] were 1.22 in the right triphasic waveforms with a normal TBI of 1.15 on the left ABI of 1.22 with triphasic waveforms and a normal TBI of 1.08. Finally he saw Dr. Amalia Hailey who will follow him in for 2 months. At this point I do not think he felt that he needed a procedure on the right calcaneal bone. Dr. Juleen China is elected for broad-spectrum antibiotic The patient is still having pain in the right heel. He walks with a walker 2/3; wounds are generally smaller. He is tolerating his IV antibiotics. I believe this is vancomycin and ceftriaxone. We are still waiting for Oasis burn in terms of his out-of-pocket max which he should be meeting soon given the IV antibiotics, MRIs etc. I have asked him to check in on this. We are using silver collagen in the meantime the wounds look  better 2/10; tolerating IV vancomycin and Rocephin. We are waiting to apply for Oasis. Although I am not really sure where he is in his out-of-pocket max. 2/17 started the first application of Oasis trilayer. Still  on antibiotics. The wounds have generally look better. The area on the left has a little more surface slough requiring debridement 3/89; second application of Oasis trilayer. The wound surface granulation is generally look better. The area on the left with undermining laterally I think is come in a bit. 10/08/2020 upon evaluation today patient is here today for [employer] application #3. Fortunately he seems to be doing extremely well with regard to this and we are seeing a lot of new epithelial growth which is great news. Fortunately there is no signs of active infection at this time. 10/16/2020 upon evaluation today patient appears to be doing well with regard to his foot ulcers. Do believe the Oasis has been of benefit for him. I do not see any signs of infection right now which is great news and I think that he has a lot of new epithelial growth which is great to see as well. The patient is very pleased to hear all of this. I do think we can proceed with the Oasis trilayer #4 today. 3/18; not as much improvement in these areas on his heels that I was hoping. I did reapply trilateral Oasis today the tissue looks healthier but not as much fill in as I was hoping. 3/25; better looking today I think this is come in a bit the tissue looks healthier. Triple layer Oasis reapplied #6 4/1; somewhat better looking definitely better looking surface not as much change in surface area as I was hoping. He may be spending more time [place] on days then he needs to although he does have heel offloading boots. Triple layer Oasis reapplied #7 4/7; unfortunately apparently [facility] will not approve any further Oasis which is unfortunate since the patient did respond nicely both in  terms ARINZE, RIVADENEIRA ([id number]) [id number] Page 8 of 12 of the condition of the wound bed as well as surface area. There is still some drainage coming from the wound but not a lot there does not appear to be any infection 4/15; we have been using Hydrofera Blue. He continues to have nice rims of epithelialization on the right greater than the left. The left the epithelialization is coming from the tip of his heel. There is moderate drainage. In this that concerns me about a total contact cast. There is no evidence of infection 4/29; patient has been using Hydrofera Blue with dressing changes. He has no complaints or issues today. 5/5; using Hydrofera Blue. I actually think that he looks marginally better than the last time I saw this 3 weeks ago. There are rims of epithelialization on the left thumb coming from the medial side on the right. Using Hydrofera Blue 5/12; using Hydrofera Blue. These continue to make improvements in surface area. His drainage was not listed as severe I therefore went ahead and put a cast on the left foot. Right foot we will continue to dress his previous 5/16; back for first total contact cast change. He did not tolerate this particularly well cast injury on the anterior tibia among other issues. Difficulty sleeping. I talked him about this in some detail and afterwards is elected to continue. I told him I would like to have a cast on for 3 weeks to see if this is going to help at all. I think he agreed 5/19; I think the wound is better. There is no tunneling towards his midfoot. The undermining medially also looks better. He has a rim of new skin distally. I think  we are making progress here. The area on the left also continues to look somewhat better to me using Hydrofera Blue. He has a list of complaints about the cast but none of them seem serious 5/26; patient presents for 1 week follow-up. He has been using a total contact cast and  tolerating this well. Hydrofera Blue is the main dressing used. He denies signs of infection. 6/2 Hydrofera Blue total contact cast on the left. These were large ulcers that formed in intensive care unit where the patient was recovering from [place]. May have had something to do with being ventilated in an upright positiono Pressors etc. We have been able to get the areas down considerably and a viable surface. There is some epithelialization in both sides. Note made of drainage 6/9; changed to [facility] last time because of drainage. He arrives with better looking surfaces and dimensions on the left than the right. Paradoxically the right actually probes more towards his midfoot the left is largely close down but both of these look improved. Using a total contact cast on the left 6/16; complex wounds on his bilateral plantar heels which were initially pressure injury from a stay in the ICU with COVID. We have been using silver alginate most recently. His dimensions of come in quite dramatically however not recently. We have been putting the left foot in a total contact cast 6/23; complex wounds on the bilateral plantar heels. I been putting the left in the cast paradoxically the area on the right is the one that is going towards closure at a faster rate. Quite a bit of drainage on the left. The patient went to see Dr. Amalia Hailey who said he was going to standby for skin grafts. I had actually considered sending him for skin grafts however he would be mandatorily off his feet for a period of weeks to months. I am thinking that the area on the right is going to close on its own the area on the left has been more stubborn even though we have him in a total contact cast 6/30; took him out of a total contact cast last week is the right heel seem to be making better progress than the left where I was placing the cast. We are using silver alginate. Both wounds are smaller right greater than left 7/12; both  wounds look as though they are making some progress. We are using silver alginate. Heel offloading boots 7/26; very gradual progress especially on the right. Using silver alginate. He is wearing heel offloading boots 8/18; he continues to close these wounds down very gradually. Using silver alginate. The problem polymen being definitive about this is areas of what appears to be callus around the margins. This is not a 100% of the area but certainly sizable especially on the right 9/1; bilateral plantar feet wounds secondary to prolonged pressure while being ventilated for COVID-19 in an upright position. Essentially pressure ulcers on the bottom of his feet. He is made substantial progress using silver alginate. 9/14; bilateral plantar feet wounds secondary to prolonged pressure. Making progress using silver alginate. 9/29 bilateral plantar feet wounds secondary to prolonged pressure. I changed him to Iodoflex last week. MolecuLight showing reddened blush fluorescence 10/11; patient presents for follow-up. He has no issues or complaints today. He denies signs of infection. He continues to use Iodoflex and antibiotic ointment to the wound beds. 10/27; 2-week follow-up. No evidence of infection. He has callus and thick dry skin around the wound margins we  have been using Iodoflex and Bactroban which was in response to a moderate left MolecuLight reddish blush fluorescence. 11/10; 2-week follow-up. Wound margins again have thick callus however the measurements of the actual wound sites are a lot smaller. Everything looks reasonably healthy here. We have been using Iodoflex He was approved for prime matrix but I have elected to delay this given the improvement in the surface area. Hopefully I will not regret that decision as were getting close to the end of the year in terms of insurance payment 12/8; 2-week follow-up. Wounds are generally smaller in size. These were initially substantial wounds  extending into the forefoot all the way into the heel on the bilateral plantar feet. They are now both located on the plantar heel distal aspect both of these have a lot of callus around the wounds I used a #5 curette to remove this on the right and the left also some subcutaneous debris to try and get the wound edges were using Iodoflex. He has heel offloading shoe 12/22; 2-week follow-up. Not really much improvement. He has thick callus around the outer edges of both wounds. I remove this there is some nonviable subcutaneous tissue as well. We have been using Iodoflex. Her intake nurse and myself spontaneously thought of a total contact cast I went back in May. At that time we really were not seeing much of an improvement with a cast although the wound was in a much different situation I would like to retry this in 2 weeks and I discussed this with the patient 08/12/2021; the patient has had some improvement with the Iodoflex. The the area on the left heel plantar more improved than the right. I had to put him in a total contact cast on the left although I decided to put that off for 2 weeks. I am going to change his primary dressing to silver collagen. I think in both areas he has had some improvement most of the healing seems to be more proximal in the heel. The wounds are in the mid aspect. A lot of thick callus on the right heel however. 1/19; we are using silver collagen on both plantar heel areas. He has had some improvement today. The left did not require any debridement. He still had some eschar on the right that was debrided but both seem to have contracted. I did not put it total contact cast on him today 2/2 we have been using silver collagen. The area on the right plantar heel has areas that appear to be epithelialized interspersed with dry flaking callus and dry skin. I removed this. This really looks better than on the other side. On the left still a large area with raised edges and  debris on the surface. 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 He has no issues or complaints today. 2/9; not much change in the left foot wound with 1 week of a cast we are using silver collagen. Silver collagen on the right side. The right side has been the better wound surface. We will reapply the total contact cast on the left 2/16; not much improvement on either side I been using silver collagen with a total contact cast on the left. I'm changing the Hydrofera Blue still with a total contact cast on the left 2/23; some improvement on both sides. Disappointing that he has thick  callus around the area that we are putting in a total contact cast on the left. We've been using Hydrofera Blue on both wound areas. This is a man who at essentially pressure ulcers in addition to ischemia caused by medications to support his blood pressure (pressors) in the ICU. He was being ventilated in the standing position for severe Covid. A Shiley the wounds extended across his entire foot but are now localized to his plantar heels bilaterally. We have made progress however neither areas healed. I continue to think the total contact cast is helped albeit painstakingly slowly. He has never wanted a plastic surgery consult although I don't know that they would be interested in grafting in area in this location. 10/07/2021: Continued improvement bilaterally. There is still some callus around the left wound, despite the total contact cast. He has some increased pain in his right midfoot around 1 particular area. This has been painful in the past but seems to be a little bit worse. When his cast was removed today, there was an area on the heel of the left foot that looks a bit macerated. He is also complaining of pain in his left thigh and hip which he thinks is secondary to the limb  length discrepancy caused by the cast. 10/14/2021: He continues to improve. A little bit less callus around the left wound. He continues to endorse pain in his right midfoot, but this is not as significant as it was last week. The maceration on his left heel is improved. CALAN, DOREN ([id number]) [id number] Page 9 of 12 10/21/2021: Continued improvement to both wounds. The maceration on his left heel is no longer evident. Less callus bilaterally. Epithelialization progressing. 10/28/2021: Significant improvement this week. The right sided wound is nearly closed with just a small open area at the middle. No maceration seen on the left heel. Continued epithelialization on both sides. No concern for infection. 11/04/2021: T oday, the wounds were measured a little bit differently and come out as larger, but I actually think they are about the same to potentially even smaller, particularly on the left. He continues to accumulate some callus on the right. 11/11/2021: T oday, the patient is expressing some concern that the left wound, despite being in the total contact cast, is not progressing at the same rate as the 1 on the right. He is interested in trying a week without the cast to see how the wound does. The wounds are roughly the same size as last week, with the right perhaps being a little bit smaller. He continues to build up callus on both sites. 11/18/2021: Last week, I permitted the patient to go without his total contact cast, just to see if the cast was really making any difference. Today, both wounds have deteriorated to some extent, suggesting that the cast is providing benefit, at least on the left. Both are larger and have accumulated callus, slough, and other debris. 11/26/2021: I debrided both wounds quite aggressively last week in an effort to stimulate the healing cascade. This appears to have been effective as the left sided wound is a full centimeter shorter in  length. Although the right was measured slightly larger, on inspection, it looks as though an area of epithelialized tissue was included in the measurements. We have been using PolyMem Ag on the wound surfaces with a total contact cast to the left. 12/02/2021: It appears that the intake personnel are including epithelialized tissue in his wound measurements; the right wound is almost  completely epithelialized; there is just a crater at the proximal midfoot with some open areas. On the left, he has built up some callus, but the overall wound surface looks good. There is some senescent skin around the wound margin. He has been in PolyMem Ag bilaterally with a total contact cast on the left. 12/09/2021: The right wound is nearly closed; there is just a small open area at the mid calcaneus. On the left, the wound is smaller with minimal callus buildup. No significant drainage. 12/16/2021: The right calcaneal wound remains minimally open at the mid calcaneus; the rest has epithelialized. On the left, the wound is also a little bit smaller. There is some senescent tissue on the periphery. He is getting his first application of a trial skin substitute called Vendaje today. 12/23/2021: The wound on his right calcaneus is nearly closed; there is just a small area at the most distal aspect of the calcaneus that is open. On the left, the area where we applied to the skin substitute has a healthier-looking bed of granulation tissue. The wound dimensions are not significantly different on this side but the wound surface is improved. 12/30/2021: The wound on the right calcaneus has not changed significantly aside from some accumulation of callus. On the left, the open area is smaller and continues to have an improved surface. He continues to accumulate callus around the wound. He is here for his third application of Vendaje. 01/06/2022: The right calcaneal wound is down to just a couple of millimeters. He continues to  accumulate periwound callus. He unfortunately got his cast wet earlier in the week and his left foot is macerated, resulting in some superficial skin loss just distal to the open wound. The open wound itself, however, is much smaller and has a healthier appearing surface. He is here for his fourth application of Vendaje. 01/13/2022: The right calcaneal wound is about the same. Unfortunately, once again, his cast got wet and his foot is again macerated. This is caused the left calcaneal wound to enlarge. He is here for his fifth application of Vendaje. 01/20/2022: The right calcaneal wound is very small. There is some periwound callus accumulation. He purchased a new cast protector last week and this has been effective in avoiding the maceration that has been occurring on the left. The left calcaneal wound is narrower and has a healthy and viable-appearing surface. He is here for his 6 application of Vendaje. 01/27/2022: The right calcaneal wound is down to just a pinhole. There is some periwound slough and callus. On the left, the wound is narrower and shorter by about a centimeter. The surface is robust and viable-appearing. Unfortunately, the rep for the trial skin substitute product did not provide any for [place] to use today. 02/04/2022: The right calcaneal wound remains unchanged. There is more accumulated callus. On the left, although the intake nurse measured it a little bit longer, it looks about the same to me. The surface has a layer of slough, but underneath this, there is good granulation tissue. 02/10/2022: The right calcaneus wound is nearly closed. There is still some callus that builds up around the site. The left side looks about the same in terms of dimensions, but the surface is more robust and vital-appearing. 02/16/2022: The area of the right calcaneus that was nearly closed last week has closed, but there is a small opening at the mid foot where it looks like some moisture got retained and  caused some reopening. The left foot wound  is narrower and shallower. Both sites have a fair amount of periwound callus and eschar. 02/24/2022: The small midfoot opening on the right calcaneus is a little bit smaller today. The left foot wound is narrower and shallower. He continues to accumulate periwound callus. No concern for infection. 03/01/2022: The patient came to clinic early because he showered and got his cast wet. Fortunately, there is no significant maceration to his foot but the callus softened and it looks like the wound on his left calcaneus may be a little bit wider. The wound on his right calcaneus is just a narrow slit. Continued accumulation of periwound callus bilaterally. 03/08/2022: The wound on his right calcaneus is very nearly closed, just a small pinpoint opening under a bit of eschar; the left wound has come in quite a bit, as well. It is narrower and shorter than at our last visit. Still with accumulated callus and eschar bilaterally. 03/17/2022: The right calcaneal wound is healed. The left wound is smaller and the surface itself is very clean, but there is some blue-green staining on the periwound callus, concerning for Pseudomonas aeruginosa. 03/23/2022: The right calcaneal wound remains closed. The left wound continues to contract. No further blue-green staining. Small amount of callus and slough accumulation. 03/28/2022: He came in early today because he had gotten his cast wet. On inspection, the wound itself did not get wet or macerated, just a little bit of the forefoot. The wound itself is basically unchanged. 04/07/2022: The right foot wound remains closed. The left wound is the smallest that I have seen it to date. It is narrower and shorter. It still continues to accumulate slough on the surface. 04/15/2022: There is a band of epithelium now dividing the small left plantar foot wound in 2. There is still some slough on the surface. 04/21/2022: The wound continues to  narrow. Just a little bit of slough on the surface. He seems to be responding well to endoform. 04/28/2022: Continued slow contraction of the wound. There is a little slough on the surface and some periwound callus. We have been using endoform and total contact cast. 05/05/2022: The wound appears to have stalled. There is slough and some periwound eschar/callus. No concern for infection, however. DARUS, HERSHMAN ([id number]) [id number] Page 10 of 12 05/12/2022: Unfortunately, his right foot has reopened. It is located at the most posterior aspect of his surgical incision. The area was noted to have drainage coming from it when his padding was removed today. Underneath some callus and senescent skin, there is an opening. No purulent drainage or malodor. On the left foot, the wound is again unchanged. There is some light blue staining on the callus, but no malodor or purulent drainage. 10/13; right and left heel remanence of extensive plantar foot wounds. These are better than I remember by quite a big margin however he is still left with wounds on the left plantar heel and the right plantar heel. Been using endoform bilaterally. A culture was done that showed apparently Pseudomonas but we are still waiting for the [facility] antibiotic to use gentamicin today. He is still very active by description I am not sure about the offloading of his noncasted right foot 10/20; both wounds right and left heel debrided not much change from last week. [employer] has arrived which is linezolid, gentamicin and ciprofloxacin we will use this with endoform. T contact cast on the left otal 06/02/2022: Both wounds are smaller today. There is still a fair amount of callus buildup around  the right foot ulcer. The left is more superficial and nearly flush with the surrounding tissues. Also with slough and eschar buildup. 06/10/2022: The right sided wound appears to be nearly closed, if not completely  so, although it is somewhat difficult to tell given the abnormal tissue and scarring in his foot. There is a fair amount of callus and crust accumulation. On the left, the wound looks about the same, again with callus and slough. He has an appointment next Thursday with Dr. Loel Lofty in podiatry; I am hopeful that there may be some reconstructive options available for Mr. Gee. 06/16/2022: Both wounds have some eschar and callus accumulation. The right sided wound is extremely narrow and barely open; the left is narrower than last week. There is a little bit of slough. He has his appointment in podiatry later today. 06/23/2022: The patient met with Dr. Loel Lofty last week and unfortunately, there are no reconstructive options that he believes would be helpful. He did order an MRI to evaluate for osteomyelitis and fortunately, none was seen. The left sided wound is a little bit shorter and narrower today. The right sided wound is about the same. There is callus and eschar accumulation bilaterally. 06/29/2022: Both feet have improved from last week. There is epithelium making a valiant effort to creep across the surface on the left. The right side looks like it got a little dry and the deep crevasse in his midfoot has cracked. Both have eschar and there is some slough on the left. 07/07/2022: Both feet have improved. There is epithelium completely covering the calcaneus on the right with just a small opening in the crevasse in his midfoot. On the left, the open area of tissue is smaller but he continues to build up callus/eschar and slough. 07/15/2022: The opening in the midfoot on the right is about the same size, covered with eschar and a little bit of slough. The open portion of the left wound is narrower and shorter with a bit of slough buildup. He admits to being on his feet more than recommended. 12/14; as far as I can tell everything on the right foot is closed. There is some eschar I removed  some of this I cannot identify any open wound here. As usual this will be a very vulnerable area going forward. On the left this looks really quite healthy. I was pleasantly surprised to see how good this looked. Wound is certainly smaller and there appears to be healthy epithelialization. He has been using Promogran on the right and endoform on the left. He has been offloading the right foot with a heel offloading boot and he has a running shoe on the right foot Objective Constitutional Vitals Time Taken: 9:20 AM, Height: 69 in, Weight: 280 lbs, BMI: 41.3, Temperature: 97.8 F, Pulse: 76 bpm, Respiratory Rate: 20 breaths/min, Blood Pressure: 125/62 mmHg. Cardiovascular The patient does not have an arterial issue.. General Notes: Wound exam oo The mid heel wound on the right is eschar as far as I can tell there is no open wound here. He does have epithelization over bone and a large area here on the plantar heel oo On the left foot/plantar heel the wound is smaller there is no evidence of infection epithelialization is evident. No debridement was necessary Integumentary (Hair, Skin) Wound #1 status is Healed - Epithelialized. Original cause of wound was Pressure Injury. The date acquired was: 10/07/2019. The wound has been in treatment 105 weeks. The wound is located on the Right Calcaneus.  The wound measures 0cm length x 0cm width x 0cm depth; 0cm^2 area and 0cm^3 volume. There is no tunneling or undermining noted. There is a none present amount of drainage noted. The wound margin is thickened. There is no granulation within the wound bed. There is no necrotic tissue within the wound bed. The periwound skin appearance had no abnormalities noted for texture. The periwound skin appearance had no abnormalities noted for moisture. The periwound skin appearance had no abnormalities noted for color. Periwound temperature was noted as No Abnormality. Wound #2 status is Open. Original cause of wound  was Pressure Injury. The date acquired was: 10/07/2019. The wound has been in treatment 105 weeks. The wound is located on the Left Calcaneus. The wound measures 1.7cm length x 0.4cm width x 0.1cm depth; 0.534cm^2 area and 0.053cm^3 volume. There is Fat Layer (Subcutaneous Tissue) exposed. There is no tunneling or undermining noted. There is a medium amount of serosanguineous drainage noted. The wound margin is thickened. There is large (67-100%) red, pink granulation within the wound bed. There is a small (1-33%) amount of necrotic tissue within the wound bed including Eschar and Adherent Slough. The periwound skin appearance had no abnormalities noted for color. The periwound skin appearance exhibited: Callus, Scarring, Maceration. Periwound temperature was noted as No Abnormality. Assessment Active Problems ICD-10 Non-pressure chronic ulcer of other part of right foot with fat layer exposed Non-pressure chronic ulcer of other part of left foot with fat layer exposed ORAZIO, WELLER ([id number]) [phone].pdf Page 11 of 12 Plan Follow-up Appointments: Return Appointment in 1 week. - Dr. [name] Room 3 Anesthetic: Wound #2 Left Calcaneus: (In clinic) Topical Lidocaine 4% applied to wound bed - In clinic Bathing/ Shower/ Hygiene: May shower with protection but do not get wound dressing(s) wet. - Cover with cast protector (can purchase cast protector at [facility] or [facility] (Cast on hold 06/23/22) Edema Control - Lymphedema / SCD / Other: Elevate legs to the level of the heart or above for 30 minutes daily and/or when sitting, a frequency of: - throughout the day Avoid standing for long periods of time. Moisturize legs daily. - as needed Off-Loading: Other: - keep pressure off of the bottom of your feet. Globoped to left foot-when not wearing the cast Elevate legs throughout the day Additional Orders / Instructions: Follow Nutritious Diet WOUND #1: - Calcaneus Wound  Laterality: Right Cleanser: Normal Saline (Generic) 1 x Per Day/30 Days Discharge Instructions: Cleanse the wound with Normal Saline prior to applying a clean dressing using gauze sponges, not tissue or cotton balls. Cleanser: Wound Cleanser (Generic) 1 x Per Day/30 Days Discharge Instructions: Cleanse the wound with wound cleanser prior to applying a clean dressing using gauze sponges, not tissue or cotton balls. Prim Dressing: Promogran Prisma Matrix, 4.34 (sq in) (silver collagen) (DME) (Generic) 1 x Per Day/30 Days ary Discharge Instructions: Moisten collagen with saline or hydrogel Secondary Dressing: Optifoam Non-Adhesive Dressing, 4x4 in (DME) (Generic) 1 x Per Day/30 Days Discharge Instructions: Apply over primary dressing as directed. Secondary Dressing: Woven Gauze Sponge, Non-Sterile 4x4 in (DME) (Generic) 1 x Per Day/30 Days Discharge Instructions: Apply over primary dressing as directed. Secured With: Child psychotherapist, Sterile 2x75 (in/in) (Generic) 1 x Per Day/30 Days Discharge Instructions: Secure with stretch gauze as directed. Secured With: 60M Medipore H Soft Cloth Surgical T ape, 4 x 10 (in/yd) (Generic) 1 x Per Day/30 Days Discharge Instructions: Secure with tape as directed. WOUND #2: - Calcaneus Wound Laterality: Left Cleanser: Normal  Saline (Generic) 1 x Per Week/30 Days Discharge Instructions: Cleanse the wound with Normal Saline prior to applying a clean dressing using gauze sponges, not tissue or cotton balls. Cleanser: Wound Cleanser 1 x Per Week/30 Days Discharge Instructions: Cleanse the wound with wound cleanser prior to applying a clean dressing using gauze sponges, not tissue or cotton balls. Topical: keystone 1 x Per Week/30 Days Discharge Instructions: use on Left Calcaneus Prim Dressing: Promogran Prisma Matrix, 4.34 (sq in) (silver collagen) (DME) (Generic) 1 x Per Week/30 Days ary Discharge Instructions: Moisten collagen with saline or  hydrogel Secondary Dressing: Optifoam Non-Adhesive Dressing, 4x4 in (DME) (Generic) 1 x Per Week/30 Days Discharge Instructions: Apply over primary dressing as directed. Secondary Dressing: Woven Gauze Sponge, Non-Sterile 4x4 in (DME) (Generic) 1 x Per Week/30 Days Discharge Instructions: Apply over primary dressing as directed. Secured With: 74M Medipore H Soft Cloth Surgical T ape, 4 x 10 (in/yd) (DME) (Generic) 1 x Per Week/30 Days Discharge Instructions: Secure with tape as directed. 1. I have healed out the right plantar foot wound. I believe this has happened previously but it does not maintain integrity. I have asked him to keep a foam cover on the bottom of his foot with insoles and the heel and sole in the right foot shoe. I have continued with endoform on the left in the heel offloading boot. #2 on the right foot there is nothing but skin over bone and a large area here. The risk of further breakdown is high unless this is Automotive engineer) Signed: 07/21/2022 5:08:57 PM By: [name] Signed: 07/22/2022 2:46:37 PM By: Linton Ham MD Previous Signature: 07/21/2022 4:13:03 PM Version By: Linton Ham MD Entered By: [name] on 07/21/2022 17:05:46 -------------------------------------------------------------------------------- SuperBill Details Patient Name: Date of Service: [name], [place] 07/21/2022 Medical Record Number: [id number] Patient Account Number: [id number] Date of Birth/Sex: Treating RN: [birth year] (46 y.o. 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 L97.522 Non-pressure chronic  ulcer of other part of left foot with fat layer exposed Facility Procedures : CPT4 Code: 12244975 Description: 99213 - WOUND CARE VISIT-LEV 3 EST PT Modifier: Quantity: 1 Physician Procedures : CPT4 Code Description Modifier [id number] 99213 - WC PHYS LEVEL 3 - EST PT ICD-10 Diagnosis Description L97.512 Non-pressure chronic ulcer of other part of right foot with fat layer exposed L97.522 Non-pressure chronic ulcer of other part of left foot  with fat layer exposed Quantity: 1 Electronic Signature(s) Signed: 07/21/2022 5:08:57 PM By: [name] Signed: 07/22/2022 2:46:37 PM By: Linton Ham MD Previous Signature: 07/21/2022 4:13:03 PM Version By: Linton Ham MD Entered By: [name] on 07/21/2022 17:05:25

## 2022-08-04 ENCOUNTER — Telehealth: Payer: Self-pay

## 2022-08-04 ENCOUNTER — Ambulatory Visit
Admission: EM | Admit: 2022-08-04 | Discharge: 2022-08-04 | Disposition: A | Discharge status: Home / Self Care | Payer: PPO | Attending: Urgent Care | Admitting: Urgent Care

## 2022-08-04 ENCOUNTER — Ambulatory Visit (INDEPENDENT_AMBULATORY_CARE_PROVIDER_SITE_OTHER): Payer: PPO

## 2022-08-04 ENCOUNTER — Encounter (HOSPITAL_BASED_OUTPATIENT_CLINIC_OR_DEPARTMENT_OTHER): Payer: PPO | Admitting: General Surgery

## 2022-08-04 DIAGNOSIS — J4540 Moderate persistent asthma, uncomplicated: Secondary | ICD-10-CM

## 2022-08-04 DIAGNOSIS — R059 Cough, unspecified: Secondary | ICD-10-CM | POA: Diagnosis not present

## 2022-08-04 DIAGNOSIS — J329 Chronic sinusitis, unspecified: Secondary | ICD-10-CM

## 2022-08-04 DIAGNOSIS — L97512 Non-pressure chronic ulcer of other part of right foot with fat layer exposed: Secondary | ICD-10-CM | POA: Diagnosis not present

## 2022-08-04 DIAGNOSIS — J309 Allergic rhinitis, unspecified: Secondary | ICD-10-CM | POA: Diagnosis not present

## 2022-08-04 DIAGNOSIS — L89623 Pressure ulcer of left heel, stage 3: Secondary | ICD-10-CM | POA: Diagnosis not present

## 2022-08-04 MED ORDER — AMOXICILLIN-POT CLAVULANATE 875-125 MG PO TABS: 1.0000 | ORAL_TABLET | Freq: Two times a day (BID) | ORAL | 0 refills | Status: DC

## 2022-08-04 MED ORDER — PREDNISONE 50 MG PO TABS: 50.0000 mg | ORAL_TABLET | Freq: Every day | ORAL | 0 refills | Status: DC

## 2022-08-04 NOTE — ED Triage Notes (Signed)
Pt c/o cough, head/chest congestion x 1 week-pt to tx room via own w/c-NAD-states he wears O2 2LNC at night

## 2022-08-04 NOTE — Progress Notes (Signed)
COMMODORE, BELLEW ([id number]) [id number] Page 1 of 7 Visit Report for 08/04/2022 Arrival Information Details Patient Name: Date of Service: [name], [name] [name] 08/04/2022 9:30 A M Medical Record Number: [id number] Patient Account Number: [id number] Date of Birth/Sex: Treating RN: 1975-09-24 (46 y.o. M) Primary Care Diona Peregoy: Dorinda Hill Other Clinician: Referring Haiven Nardone: Treating Ulas Zuercher/Extender: Jana Hakim in Treatment: 107 Visit Information History Since Last Visit All ordered tests and consults were completed: No Patient Arrived: Wheel Chair Added or deleted any medications: No Arrival Time: 09:29 Any new allergies or adverse reactions: No Accompanied By: son Had a fall or experienced change in No Transfer Assistance: Manual activities of daily living that may affect Patient Identification Verified: Yes risk of falls: Secondary Verification Process Completed: Yes Signs or symptoms of abuse/neglect since last visito No Patient Requires Transmission-Based Precautions: No Hospitalized since last visit: No Patient Has Alerts: No Implantable device outside of the clinic excluding No cellular tissue based products placed in the center since last visit: Pain Present Now: No Electronic Signature(s) Signed: 08/04/2022 12:04:04 PM By: [name] RN Previous Signature: 08/04/2022 11:05:54 AM Version By: Dayton Scrape Entered By: [name] on 08/04/2022 12:03:24 -------------------------------------------------------------------------------- Encounter Discharge Information Details Patient Name: Date of Service: [name], [name] [place] E. 08/04/2022 9:30 A M Medical Record Number: [id number] Patient Account Number: [id number] Date of Birth/Sex: Treating RN: 1976-07-16 (46 y.o. [name] Primary Care Reilynn Lauro: Dorinda Hill Other Clinician: Referring Darlene Brozowski: Treating Lessie Manigo/Extender: Jana Hakim  in Treatment: 107 Encounter Discharge Information Items Post Procedure Vitals Discharge Condition: Stable Temperature (F): 98.4 Ambulatory Status: Wheelchair Pulse (bpm): 98 Discharge Destination: Home Respiratory Rate (breaths/min): 20 Transportation: Private Auto Blood Pressure (mmHg): 105/75 Accompanied By: son/wife Schedule Follow-up Appointment: Yes Clinical Summary of Care: Patient Declined Electronic Signature(s) Signed: 08/04/2022 12:04:04 PM By: [name] RN Entered By: [name] on 08/04/2022 12:03:09 [name] ([id number]) [id number] Page 2 of 7 -------------------------------------------------------------------------------- Lower Extremity Assessment Details Patient Name: Date of Service: [name] 08/04/2022 9:30 A M Medical Record Number: [id number] Patient Account Number: [id number] Date of Birth/Sex: Treating RN: 05-21-1976 (46 y.o. [name] Primary Care Twanna Resh: Dorinda Hill Other Clinician: Referring Santanna Whitford: Treating Finnlee Guarnieri/Extender: Jana Hakim in Treatment: 107 Edema Assessment Assessed: [name]: No] Franne Forts: No] Edema: [Left: N] [Right: o] Calf Left: Right: Point of Measurement: 29 cm From Medial Instep 43.2 cm 44.8 cm Ankle Left: Right: Point of Measurement: 9 cm From Medial Instep 24 cm 25.1 cm Vascular Assessment Pulses: Dorsalis Pedis Palpable: [Left:Yes] Electronic Signature(s) Signed: 08/04/2022 12:04:04 PM By: [name] RN Entered By: [name] on 08/04/2022 09:42:14 -------------------------------------------------------------------------------- Multi Wound Chart Details Patient Name: Date of Service: [name], [name] [place] E. 08/04/2022 9:30 A M Medical Record Number: [id number] Patient Account Number: [id number] Date of Birth/Sex: Treating RN: January 21, 1976 (46 y.o. M) Primary Care Jayli Fogleman: Dorinda Hill Other Clinician: Referring Hampton Cost: Treating  Adlean Hardeman/Extender: Jana Hakim in Treatment: 107 Vital Signs Height(in): 69 Pulse(bpm): 98 Weight(lbs): 280 Blood Pressure(mmHg): 105/75 Body Mass Index(BMI): 41.3 Temperature(F): 98.4 Respiratory Rate(breaths/min): 20 [2:Photos:] [N/A:N/A] Left Calcaneus N/A N/A Wound Location: Pressure Injury N/A N/A Wounding Event: Pressure Ulcer N/A N/A Primary Etiology: Asthma, Angina, Hypertension N/A N/A Comorbid History: 10/07/2019 N/A N/A Date Acquired: 107 N/A N/A Weeks of Treatment: Open N/A N/A Wound Status: No N/A N/A Wound Recurrence: 0.7x0.2x0.1 N/A N/A Measurements L x W x D (cm) 0.11 N/A N/A A (cm) : rea 0.011  N/A N/A Volume (cm) : 99.60% N/A N/A % Reduction in A rea: 100.00% N/A N/A % Reduction in Volume: Category/Stage III N/A N/A Classification: Medium N/A N/A Exudate A mount: Serosanguineous N/A N/A Exudate Type: red, brown N/A N/A Exudate Color: Thickened N/A N/A Wound Margin: Large (67-100%) N/A N/A Granulation A mount: Red, Pink N/A N/A Granulation Quality: Small (1-33%) N/A N/A Necrotic A mount: Eschar, Adherent Slough N/A N/A Necrotic Tissue: Fat Layer (Subcutaneous Tissue): Yes N/A N/A Exposed Structures: Fascia: No Tendon: No Muscle: No Joint: No Bone: No Medium (34-66%) N/A N/A Epithelialization: Debridement - Excisional N/A N/A Debridement: Pre-procedure Verification/Time Out 09:48 N/A N/A Taken: Lidocaine 5% topical ointment N/A N/A Pain Control: Necrotic/Eschar, Subcutaneous, N/A N/A Tissue Debrided: Slough Skin/Subcutaneous Tissue N/A N/A Level: 0.14 N/A N/A Debridement A (sq cm): rea Curette N/A N/A Instrument: Minimum N/A N/A Bleeding: Pressure N/A N/A Hemostasis Achieved: 0 N/A N/A Procedural Pain: 0 N/A N/A Post Procedural Pain: Debridement Treatment Response: Procedure was tolerated well N/A N/A Post Debridement Measurements L x 0.7x0.2x0.1 N/A N/A W x D (cm) 0.011 N/A N/A Post  Debridement Volume: (cm) Category/Stage III N/A N/A Post Debridement Stage: Callus: Yes N/A N/A Periwound Skin Texture: Scarring: Yes Maceration: Yes N/A N/A Periwound Skin Moisture: No Abnormalities Noted N/A N/A Periwound Skin Color: No Abnormality N/A N/A Temperature: Debridement N/A N/A Procedures Performed: Treatment Notes Electronic Signature(s) Signed: 08/04/2022 9:54:57 AM By: [name] MD FACS Entered By: [name] on 08/04/2022 09:54:57 -------------------------------------------------------------------------------- Multi-Disciplinary Care Plan Details Patient Name: Date of Service: [name], [name] [place] E. 08/04/2022 9:30 A M Medical Record Number: [id number] Patient Account Number: [id number] Date of Birth/Sex: Treating RN: [birth year] (46 y.o. [name] Primary Care Keygan Dumond: Dorinda Hill Other Clinician: Referring Keylen Uzelac: Treating Tyreece Gelles/Extender: Jana Hakim in Treatment: 107 Multidisciplinary Care Plan reviewed with physician AAYANSH, CODISPOTI ([id number]) [id number] Page 4 of 7 Active Inactive Wound/Skin Impairment Nursing Diagnoses: Impaired tissue integrity Knowledge deficit related [name] ulceration/compromised skin integrity Goals: Patient/caregiver will verbalize understanding of skin care regimen Date Initiated: 07/15/2020 Target Resolution Date: 03/08/2023 Goal Status: Active Ulcer/skin breakdown will have a volume reduction of 30% by week 4 Date Initiated: 07/15/2020 Date Inactivated: 08/20/2020 Target Resolution Date: 09/03/2020 Goal Status: Unmet Unmet Reason: no major changes. Ulcer/skin breakdown will heal within 14 weeks Date Initiated: 12/04/2020 Date Inactivated: 12/10/2020 Target Resolution Date: 12/10/2020 Unmet Reason: wounds still open at 14 Goal Status: Unmet weeks and today 21 weeks. Interventions: Assess patient/caregiver ability [name] obtain necessary supplies Assess  patient/caregiver ability [name] perform ulcer/skin care regimen upon admission and as needed Assess ulceration(s) every visit Provide education on ulcer and skin care Treatment Activities: Skin care regimen initiated : 07/15/2020 Topical wound management initiated : 07/15/2020 Notes: Electronic Signature(s) Signed: 08/04/2022 12:04:04 PM By: [name] RN Entered By: [name] on 08/04/2022 12:01:47 -------------------------------------------------------------------------------- Pain Assessment Details Patient Name: Date of Service: [name], [name] 08/04/2022 9:30 A M Medical Record Number: [id number] Patient Account Number: [id number] Date of Birth/Sex: Treating RN: 1975-09-20 (46 y.o. M) Primary Care Alyssandra Hulsebus: Dorinda Hill Other Clinician: Referring Graylyn Bunney: Treating Garlene Apperson/Extender: Jana Hakim in Treatment: 107 Active Problems Location of Pain Severity and Description of Pain Patient Has Paino Yes Site Locations Rate the pain. Current Pain Level: 4 Worst Pain Level: 10 Least Pain Level: 0 Tolerable Pain Level: 3 [name], [name] ([id number]) [id number] Page 5 of 7 Pain Management and Medication Current Pain Management: Electronic Signature(s) Signed: 08/04/2022 11:05:54 AM By: Tenny Craw,  Aisha Entered ByDayton Scrape on 08/04/2022 09:30:15 -------------------------------------------------------------------------------- Patient/Caregiver Education Details Patient Name: Date of Service: [name] 12/28/2023andnbsp9:30 A M Medical Record Number: [id number] Patient Account Number: [id number] Date of Birth/Gender: Treating RN: 1976/01/11 (46 y.o. [name] Primary Care Physician: Dorinda Hill Other Clinician: Referring Physician: Treating Physician/Extender: Jana Hakim in Treatment: 107 Education Assessment Education Provided [name]: Patient Education Topics Provided Wound/Skin  Impairment: Methods: Explain/Verbal Responses: Return demonstration correctly Electronic Signature(s) Signed: 08/04/2022 12:04:04 PM By: [name] RN Entered By: [name] on 08/04/2022 12:02:02 -------------------------------------------------------------------------------- Wound Assessment Details Patient Name: Date of Service: [name], [name] 08/04/2022 9:30 A M Medical Record Number: [id number] Patient Account Number: [id number] Date of Birth/Sex: Treating RN: 1976-02-07 (46 y.o. M) Primary Care Winton Offord: Dorinda Hill Other Clinician: Referring Eian Vandervelden: Treating Aritza Brunet/Extender: Jana Hakim in Treatment: 107 Wound Status Wound Number: 2 Primary Etiology: Pressure Ulcer Wound Location: Left Calcaneus Wound Status: Open Wounding Event: Pressure Injury Comorbid History: Asthma, Angina, Hypertension Date Acquired: 10/07/2019 Weeks Of Treatment: 107 Clustered Wound: No Photos BENEDICT, KUE ([id number]) [id number] Page 6 of 7 Wound Measurements Length: (cm) 0.7 Width: (cm) 0.2 Depth: (cm) 0.1 Area: (cm) 0.11 Volume: (cm) 0.011 % Reduction in Area: 99.6% % Reduction in Volume: 100% Epithelialization: Medium (34-66%) Tunneling: No Undermining: No Wound Description Classification: Category/Stage III Wound Margin: Thickened Exudate Amount: Medium Exudate Type: Serosanguineous Exudate Color: red, brown Foul Odor After Cleansing: No Slough/Fibrino Yes Wound Bed Granulation Amount: Large (67-100%) Exposed Structure Granulation Quality: Red, Pink Fascia Exposed: No Necrotic Amount: Small (1-33%) Fat Layer (Subcutaneous Tissue) Exposed: Yes Necrotic Quality: Eschar, Adherent Slough Tendon Exposed: No Muscle Exposed: No Joint Exposed: No Bone Exposed: No Periwound Skin Texture Texture Color No Abnormalities Noted: No No Abnormalities Noted: Yes Callus: Yes Temperature / Pain Scarring:  Yes Temperature: No Abnormality Moisture No Abnormalities Noted: No Maceration: Yes Treatment Notes Wound #2 (Calcaneus) Wound Laterality: Left Cleanser Normal Saline Discharge Instruction: Cleanse the wound with Normal Saline prior [name] applying a clean dressing using gauze sponges, not tissue or cotton balls. Wound Cleanser Discharge Instruction: Cleanse the wound with wound cleanser prior [name] applying a clean dressing using gauze sponges, not tissue or cotton balls. Peri-Wound Care Topical Primary Dressing Endoform 2x2 in Discharge Instruction: Moisten with saline or Hydrogel Secondary Dressing Optifoam Non-Adhesive Dressing, 4x4 in Discharge Instruction: Apply over primary dressing as directed. Woven Gauze Sponge, Non-Sterile 4x4 in Discharge Instruction: Apply over primary dressing as directed. Secured With 31M Medipore H Soft Cloth Surgical T ape, 4 x 10 (in/yd) Discharge Instruction: Secure with tape as directed. [name], [name] ([id number]) [id number] Page 7 of 7 Compression Wrap Compression Stockings Add-Ons Electronic Signature(s) Signed: 08/04/2022 12:04:04 PM By: [name] RN Entered By: [name] on 08/04/2022 09:42:40 -------------------------------------------------------------------------------- Vitals Details Patient Name: Date of Service: [name], [name] [place] E. 08/04/2022 9:30 A M Medical Record Number: [id number] Patient Account Number: [id number] Date of Birth/Sex: Treating RN: 17-Mar-1976 (46 y.o. M) Primary Care Daryn Hicks: Dorinda Hill Other Clinician: Referring Anjana Cheek: Treating Jazell Rosenau/Extender: Jana Hakim in Treatment: 107 Vital Signs Time Taken: 09:29 Temperature (F): 98.4 Height (in): 69 Pulse (bpm): 98 Weight (lbs): 280 Respiratory Rate (breaths/min): 20 Body Mass Index (BMI): 41.3 Blood Pressure (mmHg): 105/75 Reference Range: 80 - 120 mg / dl Electronic Signature(s) Signed:  08/04/2022 11:05:54 AM By: [place] Scrape Entered By: [place] Scrape on 08/04/2022 09:30:00

## 2022-08-04 NOTE — Telephone Encounter (Signed)
Spoke to pharmacy and they stated Predisone RX are not crossing the system via electronic send. Read script to pharmacist.

## 2022-08-04 NOTE — Progress Notes (Addendum)
ROLDAN, LAFOREST ([id number]) [id number] Page 1 of 15 Visit Report for 08/04/2022 Chief Complaint Document Details Patient Name: Date of Service: [name], [place] 08/04/2022 9:30 A M Medical Record Number: [id number] Patient Account Number: [id number] Date of Birth/Sex: Treating RN: 15-Apr-1976 (46 y.o. M) Primary Care Provider: [name] Other Clinician: Referring Provider: Treating Provider/Extender: [name] in Treatment: 107 Information Obtained from: Patient Chief Complaint Bilateral Plantar Foot Ulcers Electronic Signature(s) Signed: 08/04/2022 9:55:04 AM By: Fredirick Maudlin MD FACS Entered By: Fredirick Maudlin on 08/04/2022 09:55:04 -------------------------------------------------------------------------------- Debridement Details Patient Name: Date of Service: [name], [place] 08/04/2022 9:30 A M Medical Record Number: [id number] Patient Account Number: [id number] Date of Birth/Sex: Treating RN: 1975-10-27 (46 y.o. [name] Primary Care Provider: [name] Other Clinician: Referring Provider: Treating Provider/Extender: [name] in Treatment: 107 Debridement Performed for Assessment: Wound #2 Left Calcaneus Performed By: Physician Fredirick Maudlin, MD Debridement Type: Debridement Level of Consciousness (Pre-procedure): Awake and Alert Pre-procedure Verification/Time Out Yes - 09:48 Taken: Start Time: 09:48 Pain Control: Lidocaine 5% topical ointment T Area Debrided (L x W): otal 0.7 (cm) x 0.2 (cm) = 0.14 (cm) Tissue and other material debrided: Non-Viable, Eschar, Slough, Subcutaneous, Slough Level: Skin/Subcutaneous Tissue Debridement Description: Excisional Instrument: Curette Bleeding: Minimum Hemostasis Achieved: Pressure End Time: 09:49 Procedural Pain: 0 Post Procedural Pain: 0 Response to Treatment: Procedure was tolerated well Level of Consciousness  (Post- Awake and Alert procedure): Post Debridement Measurements of Total Wound Length: (cm) 0.7 Stage: Category/Stage III Width: (cm) 0.2 Depth: (cm) 0.1 Volume: (cm) 0.011 Character of Wound/Ulcer Post Debridement: Improved Post Procedure Diagnosis GADGE, HERMIZ ([id number]) [id number] Page 2 of 15 Same as Pre-procedure Notes Scribed for Dr. [name] by J.Scotton Electronic Signature(s) Signed: 08/04/2022 10:27:26 AM By: Fredirick Maudlin MD FACS Signed: 08/04/2022 12:04:04 PM By: [name] Entered By: [name] on 08/04/2022 09:53:54 -------------------------------------------------------------------------------- HPI Details Patient Name: Date of Service: [name], [place] 08/04/2022 9:30 A M Medical Record Number: [id number] Patient Account Number: [id number] Date of Birth/Sex: Treating RN: 1975/11/17 (46 y.o. M) Primary Care Provider: [name] Other Clinician: Referring Provider: Treating Provider/Extender: [name] in Treatment: 107 History of Present Illness HPI Description: Wounds are12/03/2020 upon evaluation today patient presents for initial inspection here in our clinic concerning issues he has been having with the bottoms of his feet bilaterally. He states these actually occurred as wounds when he was hospitalized for 5 months secondary to Covid. He was apparently with tilting bed where he was in an upright position quite frequently and apparently this occurred in some way shape or form during that time. Fortunately there is no sign of active infection at this time. No fevers, chills, nausea, vomiting, or diarrhea. With that being said he still has substantial wounds on the plantar aspects of his feet Theragen require quite a bit of work to get these to heal. He has been using Santyl currently though that is been problematic both in receiving the medication as well as actually paid for it as it is  become quite expensive. Prior to the experience with Covid the patient really did not have any major medical problems other than hypertension he does have some mild generalized weakness following the Covid experience. 07/22/2020 on evaluation today patient appears to be doing okay in regard to his foot ulcers I feel like the wound beds are showing signs of better improvement that I do believe the Iodoflex  is helping in this regard. With that being said he does have a lot of drainage currently and this is somewhat blue/green in nature which is consistent with Pseudomonas. I do think a culture today would be appropriate for [place] to evaluate and see if that is indeed the case I would likely start him on antibiotic orally as well he is not allergic to Cipro knows of no issues he has had in the past 12/21; patient was admitted to the clinic earlier this month with bilateral presumed pressure ulcers on the bottom of his feet apparently related to excessive pressure from a tilt table arrangement in the intensive care unit. Patient relates this to being on ECMO but I am not really sure that is exactly related to that. I must say I have never seen anything like this. He has fairly extensive full-thickness wounds extending from his heel towards his midfoot mostly centered laterally. There is already been some healing distally. He does not appear to have an arterial issue. He has been using gentamicin to the wound surfaces with Iodoflex to help with ongoing debridement 1/6; this is a patient with pressure ulcers on the bottom of his feet related to excessive pressure from a standing position in the intensive care unit. He is complaining of a lot of pain in the right heel. He is not a diabetic. 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 We have been using Iodoflex to help prepare the surfaces of both wounds for an advanced treatment product. He is nonambulatory spending most of his time in a  wheelchair I have asked him not to propel the wheelchair with his heels 1/13; in general his wounds look better not much surface area change we have been using Iodoflex as of last week. I did an x-ray of the right heel as the patient was complaining of pain. I had some thoughts about a stress fracture perhaps Achilles tendon problems however what it showed was erosive changes along the inferior aspect of the calcaneus he now has a MRI booked for 1/20. 1/20; in general his wounds continue to be better. Some improvement in the large narrow areas proximally in his foot. He is still complaining of pain in the right heel and tenderness in certain areas of this wound. His MRI is tonight. I am not just looking for osteomyelitis that was brought up on the x-ray I am wondering about stress fractures, tendon ruptures etc. He has no such findings on the left. Also noteworthy is that the patient had critical illness neuropathy and some of the discomfort may be actual improvement in nerve function I am just not sure. These wounds were initially in the setting of severe critical illness related to COVID-19. He was put in a standing position. He may have also been on pressors at the point contributing to tissue ischemia. By his description at some point these wounds were grossly necrotic extending proximally up into the Achilles part of his heel. I do not know that I have ever really seen pictures of them like this although they may exist in epic We have ordered Tri layer Oasis. I am trying to stimulate some granulation in these areas. This is of course assuming the MRI is negative for infection 1/27; since the patient was last here he saw Dr. Juleen China of infectious disease. He is planned for vancomycin and ceftriaxone. Prior operative culture grew MSSA. Also ordered baseline lab work. He also ordered arterial studies although the ABIs in our  clinic were normal as well as his clinical exam these were normal I do not  think he needs to see vascular surgery. His ABIs at the [facility] were 1.22 in the right triphasic waveforms with a normal TBI of 1.15 on the left ABI of 1.22 with triphasic waveforms and a normal TBI of 1.08. Finally he saw Dr. Amalia Hailey who will follow him in for 2 months. At this point I do not think he felt that he needed a procedure on the right calcaneal bone. Dr. Juleen China is elected for broad-spectrum antibiotic The patient is still having pain in the right heel. He walks with a walker 2/3; wounds are generally smaller. He is tolerating his IV antibiotics. I believe this is vancomycin and ceftriaxone. We are still waiting for Oasis burn in terms of his out-of-pocket max which he should be meeting soon given the IV antibiotics, MRIs etc. I have asked him to check in on this. We are using silver collagen in the meantime the wounds look better 2/10; tolerating IV vancomycin and Rocephin. We are waiting to apply for Oasis. Although I am not really sure where he is in his out-of-pocket max. 2/17 started the first application of Oasis trilayer. Still on antibiotics. The wounds have generally look better. The area on the left has a little more surface TERRI, MALERBA ([id number]) [id number] Page 3 of 15 slough requiring debridement 4/09; second application of Oasis trilayer. The wound surface granulation is generally look better. The area on the left with undermining laterally I think is come in a bit. 10/08/2020 upon evaluation today patient is here today for [employer] application #3. Fortunately he seems to be doing extremely well with regard to this and we are seeing a lot of new epithelial growth which is great news. Fortunately there is no signs of active infection at this time. 10/16/2020 upon evaluation today patient appears to be doing well with regard to his foot ulcers. Do believe the Oasis has been of benefit for him. I do not see any signs of infection right now which  is great news and I think that he has a lot of new epithelial growth which is great to see as well. The patient is very pleased to hear all of this. I do think we can proceed with the Oasis trilayer #4 today. 3/18; not as much improvement in these areas on his heels that I was hoping. I did reapply trilateral Oasis today the tissue looks healthier but not as much fill in as I was hoping. 3/25; better looking today I think this is come in a bit the tissue looks healthier. Triple layer Oasis reapplied #6 4/1; somewhat better looking definitely better looking surface not as much change in surface area as I was hoping. He may be spending more time [place] on days then he needs to although he does have heel offloading boots. Triple layer Oasis reapplied #7 4/7; unfortunately apparently [facility] will not approve any further Oasis which is unfortunate since the patient did respond nicely both in terms of the condition of the wound bed as well as surface area. There is still some drainage coming from the wound but not a lot there does not appear to be any infection 4/15; we have been using Hydrofera Blue. He continues to have nice rims of epithelialization on the right greater than the left. The left the epithelialization is coming from the tip of his heel. There is moderate drainage. In this that concerns  me about a total contact cast. There is no evidence of infection 4/29; patient has been using Hydrofera Blue with dressing changes. He has no complaints or issues today. 5/5; using Hydrofera Blue. I actually think that he looks marginally better than the last time I saw this 3 weeks ago. There are rims of epithelialization on the left thumb coming from the medial side on the right. Using Hydrofera Blue 5/12; using Hydrofera Blue. These continue to make improvements in surface area. His drainage was not listed as severe I therefore went ahead and put a cast on the left foot. Right foot we will  continue to dress his previous 5/16; back for first total contact cast change. He did not tolerate this particularly well cast injury on the anterior tibia among other issues. Difficulty sleeping. I talked him about this in some detail and afterwards is elected to continue. I told him I would like to have a cast on for 3 weeks to see if this is going to help at all. I think he agreed 5/19; I think the wound is better. There is no tunneling towards his midfoot. The undermining medially also looks better. He has a rim of new skin distally. I think we are making progress here. The area on the left also continues to look somewhat better to me using Hydrofera Blue. He has a list of complaints about the cast but none of them seem serious 5/26; patient presents for 1 week follow-up. He has been using a total contact cast and tolerating this well. Hydrofera Blue is the main dressing used. He denies signs of infection. 6/2 Hydrofera Blue total contact cast on the left. These were large ulcers that formed in intensive care unit where the patient was recovering from [place]. May have had something to do with being ventilated in an upright positiono Pressors etc. We have been able to get the areas down considerably and a viable surface. There is some epithelialization in both sides. Note made of drainage 6/9; changed to [facility] last time because of drainage. He arrives with better looking surfaces and dimensions on the left than the right. Paradoxically the right actually probes more towards his midfoot the left is largely close down but both of these look improved. Using a total contact cast on the left 6/16; complex wounds on his bilateral plantar heels which were initially pressure injury from a stay in the ICU with COVID. We have been using silver alginate most recently. His dimensions of come in quite dramatically however not recently. We have been putting the left foot in a total contact cast 6/23;  complex wounds on the bilateral plantar heels. I been putting the left in the cast paradoxically the area on the right is the one that is going towards closure at a faster rate. Quite a bit of drainage on the left. The patient went to see Dr. Amalia Hailey who said he was going to standby for skin grafts. I had actually considered sending him for skin grafts however he would be mandatorily off his feet for a period of weeks to months. I am thinking that the area on the right is going to close on its own the area on the left has been more stubborn even though we have him in a total contact cast 6/30; took him out of a total contact cast last week is the right heel seem to be making better progress than the left where I was placing the cast. We are using silver  alginate. Both wounds are smaller right greater than left 7/12; both wounds look as though they are making some progress. We are using silver alginate. Heel offloading boots 7/26; very gradual progress especially on the right. Using silver alginate. He is wearing heel offloading boots 8/18; he continues to close these wounds down very gradually. Using silver alginate. The problem polymen being definitive about this is areas of what appears to be callus around the margins. This is not a 100% of the area but certainly sizable especially on the right 9/1; bilateral plantar feet wounds secondary to prolonged pressure while being ventilated for COVID-19 in an upright position. Essentially pressure ulcers on the bottom of his feet. He is made substantial progress using silver alginate. 9/14; bilateral plantar feet wounds secondary to prolonged pressure. Making progress using silver alginate. 9/29 bilateral plantar feet wounds secondary to prolonged pressure. I changed him to Iodoflex last week. MolecuLight showing reddened blush fluorescence 10/11; patient presents for follow-up. He has no issues or complaints today. He denies signs of infection. He continues  to use Iodoflex and antibiotic ointment to the wound beds. 10/27; 2-week follow-up. No evidence of infection. He has callus and thick dry skin around the wound margins we have been using Iodoflex and Bactroban which was in response to a moderate left MolecuLight reddish blush fluorescence. 11/10; 2-week follow-up. Wound margins again have thick callus however the measurements of the actual wound sites are a lot smaller. Everything looks reasonably healthy here. We have been using Iodoflex He was approved for prime matrix but I have elected to delay this given the improvement in the surface area. Hopefully I will not regret that decision as were getting close to the end of the year in terms of insurance payment 12/8; 2-week follow-up. Wounds are generally smaller in size. These were initially substantial wounds extending into the forefoot all the way into the heel on the bilateral plantar feet. They are now both located on the plantar heel distal aspect both of these have a lot of callus around the wounds I used a #5 curette to remove this on the right and the left also some subcutaneous debris to try and get the wound edges were using Iodoflex. He has heel offloading shoe 12/22; 2-week follow-up. Not really much improvement. He has thick callus around the outer edges of both wounds. I remove this there is some nonviable subcutaneous tissue as well. We have been using Iodoflex. Her intake nurse and myself spontaneously thought of a total contact cast I went back in May. At that time we really were not seeing much of an improvement with a cast although the wound was in a much different situation I would like to retry this in 2 weeks and I discussed this with the patient 08/12/2021; the patient has had some improvement with the Iodoflex. The the area on the left heel plantar more improved than the right. I had to put him in a total contact cast on the left although I decided to put that off for 2 weeks. I  am going to change his primary dressing to silver collagen. I think in both areas he has had some improvement most of the healing seems to be more proximal in the heel. The wounds are in the mid aspect. A lot of thick callus on the right heel however. 1/19; we are using silver collagen on both plantar heel areas. He has had some improvement today. The left did not require any debridement. He still had  some eschar on the right that was debrided but both seem to have contracted. I did not put it total contact cast on him today 2/2 we have been using silver collagen. The area on the right plantar heel has areas that appear to be epithelialized interspersed with dry flaking callus and dry skin. I removed this. This really looks better than on the other side. On the left still a large area with raised edges and debris on the surface. 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 He has no issues or complaints today. 2/9; not much change in the left foot wound with 1 week of a cast we are using silver collagen. Silver collagen on the right side. The right side has been the better wound surface. We will reapply the total contact cast on the left 2/16; not much improvement on either side I been using silver collagen with a total contact cast on the left. I'm changing the [facility] still with a total contact cast on the left SHERYL, TOWELL ([id number]) [id number] Page 4 of 15 2/23; some improvement on both sides. Disappointing that he has thick callus around the area that we are putting in a total contact cast on the left. We've been using Hydrofera Blue on both wound areas. This is a man who at essentially pressure ulcers in addition to ischemia caused by medications to support his blood pressure (pressors) in the ICU. He was being ventilated in  the standing position for severe Covid. A Shiley the wounds extended across his entire foot but are now localized to his plantar heels bilaterally. We have made progress however neither areas healed. I continue to think the total contact cast is helped albeit painstakingly slowly. He has never wanted a plastic surgery consult although I don't know that they would be interested in grafting in area in this location. 10/07/2021: Continued improvement bilaterally. There is still some callus around the left wound, despite the total contact cast. He has some increased pain in his right midfoot around 1 particular area. This has been painful in the past but seems to be a little bit worse. When his cast was removed today, there was an area on the heel of the left foot that looks a bit macerated. He is also complaining of pain in his left thigh and hip which he thinks is secondary to the limb length discrepancy caused by the cast. 10/14/2021: He continues to improve. A little bit less callus around the left wound. He continues to endorse pain in his right midfoot, but this is not as significant as it was last week. The maceration on his left heel is improved. 10/21/2021: Continued improvement to both wounds. The maceration on his left heel is no longer evident. Less callus bilaterally. Epithelialization progressing. 10/28/2021: Significant improvement this week. The right sided wound is nearly closed with just a small open area at the middle. No maceration seen on the left heel. Continued epithelialization on both sides. No concern for infection. 11/04/2021: T oday, the wounds were measured a little bit differently and come out as larger, but I actually think they are about the same to potentially even smaller, particularly on the left. He continues to accumulate some callus on the right. 11/11/2021: T oday, the patient is expressing some concern that the left wound, despite being in the total contact cast, is  not  progressing at the same rate as the 1 on the right. He is interested in trying a week without the cast to see how the wound does. The wounds are roughly the same size as last week, with the right perhaps being a little bit smaller. He continues to build up callus on both sites. 11/18/2021: Last week, I permitted the patient to go without his total contact cast, just to see if the cast was really making any difference. Today, both wounds have deteriorated to some extent, suggesting that the cast is providing benefit, at least on the left. Both are larger and have accumulated callus, slough, and other debris. 11/26/2021: I debrided both wounds quite aggressively last week in an effort to stimulate the healing cascade. This appears to have been effective as the left sided wound is a full centimeter shorter in length. Although the right was measured slightly larger, on inspection, it looks as though an area of epithelialized tissue was included in the measurements. We have been using PolyMem Ag on the wound surfaces with a total contact cast to the left. 12/02/2021: It appears that the intake personnel are including epithelialized tissue in his wound measurements; the right wound is almost completely epithelialized; there is just a crater at the proximal midfoot with some open areas. On the left, he has built up some callus, but the overall wound surface looks good. There is some senescent skin around the wound margin. He has been in PolyMem Ag bilaterally with a total contact cast on the left. 12/09/2021: The right wound is nearly closed; there is just a small open area at the mid calcaneus. On the left, the wound is smaller with minimal callus buildup. No significant drainage. 12/16/2021: The right calcaneal wound remains minimally open at the mid calcaneus; the rest has epithelialized. On the left, the wound is also a little bit smaller. There is some senescent tissue on the periphery. He is getting his  first application of a trial skin substitute called Vendaje today. 12/23/2021: The wound on his right calcaneus is nearly closed; there is just a small area at the most distal aspect of the calcaneus that is open. On the left, the area where we applied to the skin substitute has a healthier-looking bed of granulation tissue. The wound dimensions are not significantly different on this side but the wound surface is improved. 12/30/2021: The wound on the right calcaneus has not changed significantly aside from some accumulation of callus. On the left, the open area is smaller and continues to have an improved surface. He continues to accumulate callus around the wound. He is here for his third application of Vendaje. 01/06/2022: The right calcaneal wound is down to just a couple of millimeters. He continues to accumulate periwound callus. He unfortunately got his cast wet earlier in the week and his left foot is macerated, resulting in some superficial skin loss just distal to the open wound. The open wound itself, however, is much smaller and has a healthier appearing surface. He is here for his fourth application of Vendaje. 01/13/2022: The right calcaneal wound is about the same. Unfortunately, once again, his cast got wet and his foot is again macerated. This is caused the left calcaneal wound to enlarge. He is here for his fifth application of Vendaje. 01/20/2022: The right calcaneal wound is very small. There is some periwound callus accumulation. He purchased a new cast protector last week and this has been effective in avoiding the maceration that has  been occurring on the left. The left calcaneal wound is narrower and has a healthy and viable-appearing surface. He is here for his 6 application of Vendaje. 01/27/2022: The right calcaneal wound is down to just a pinhole. There is some periwound slough and callus. On the left, the wound is narrower and shorter by about a centimeter. The surface is robust  and viable-appearing. Unfortunately, the rep for the trial skin substitute product did not provide any for [place] to use today. 02/04/2022: The right calcaneal wound remains unchanged. There is more accumulated callus. On the left, although the intake nurse measured it a little bit longer, it looks about the same to me. The surface has a layer of slough, but underneath this, there is good granulation tissue. 02/10/2022: The right calcaneus wound is nearly closed. There is still some callus that builds up around the site. The left side looks about the same in terms of dimensions, but the surface is more robust and vital-appearing. 02/16/2022: The area of the right calcaneus that was nearly closed last week has closed, but there is a small opening at the mid foot where it looks like some moisture got retained and caused some reopening. The left foot wound is narrower and shallower. Both sites have a fair amount of periwound callus and eschar. 02/24/2022: The small midfoot opening on the right calcaneus is a little bit smaller today. The left foot wound is narrower and shallower. He continues to accumulate periwound callus. No concern for infection. 03/01/2022: The patient came to clinic early because he showered and got his cast wet. Fortunately, there is no significant maceration to his foot but the callus softened and it looks like the wound on his left calcaneus may be a little bit wider. The wound on his right calcaneus is just a narrow slit. Continued accumulation of periwound callus bilaterally. 03/08/2022: The wound on his right calcaneus is very nearly closed, just a small pinpoint opening under a bit of eschar; the left wound has come in quite a bit, as well. It is narrower and shorter than at our last visit. Still with accumulated callus and eschar bilaterally. 03/17/2022: The right calcaneal wound is healed. The left wound is smaller and the surface itself is very clean, but there is some blue-green  staining on the periwound callus, concerning for Pseudomonas aeruginosa. 03/23/2022: The right calcaneal wound remains closed. The left wound continues to contract. No further blue-green staining. Small amount of callus and slough accumulation. CORDELL, GUERCIO ([id number]) [id number] Page 5 of 15 03/28/2022: He came in early today because he had gotten his cast wet. On inspection, the wound itself did not get wet or macerated, just a little bit of the forefoot. The wound itself is basically unchanged. 04/07/2022: The right foot wound remains closed. The left wound is the smallest that I have seen it to date. It is narrower and shorter. It still continues to accumulate slough on the surface. 04/15/2022: There is a band of epithelium now dividing the small left plantar foot wound in 2. There is still some slough on the surface. 04/21/2022: The wound continues to narrow. Just a little bit of slough on the surface. He seems to be responding well to endoform. 04/28/2022: Continued slow contraction of the wound. There is a little slough on the surface and some periwound callus. We have been using endoform and total contact cast. 05/05/2022: The wound appears to have stalled. There is slough and some periwound eschar/callus. No concern for infection, however.  05/12/2022: Unfortunately, his right foot has reopened. It is located at the most posterior aspect of his surgical incision. The area was noted to have drainage coming from it when his padding was removed today. Underneath some callus and senescent skin, there is an opening. No purulent drainage or malodor. On the left foot, the wound is again unchanged. There is some light blue staining on the callus, but no malodor or purulent drainage. 10/13; right and left heel remanence of extensive plantar foot wounds. These are better than I remember by quite a big margin however he is still left with wounds on the left plantar heel and the  right plantar heel. Been using endoform bilaterally. A culture was done that showed apparently Pseudomonas but we are still waiting for the [facility] antibiotic to use gentamicin today. He is still very active by description I am not sure about the offloading of his noncasted right foot 10/20; both wounds right and left heel debrided not much change from last week. [employer] has arrived which is linezolid, gentamicin and ciprofloxacin we will use this with endoform. T contact cast on the left otal 06/02/2022: Both wounds are smaller today. There is still a fair amount of callus buildup around the right foot ulcer. The left is more superficial and nearly flush with the surrounding tissues. Also with slough and eschar buildup. 06/10/2022: The right sided wound appears to be nearly closed, if not completely so, although it is somewhat difficult to tell given the abnormal tissue and scarring in his foot. There is a fair amount of callus and crust accumulation. On the left, the wound looks about the same, again with callus and slough. He has an appointment next Thursday with Dr. Loel Lofty in podiatry; I am hopeful that there may be some reconstructive options available for Mr. Butrick. 06/16/2022: Both wounds have some eschar and callus accumulation. The right sided wound is extremely narrow and barely open; the left is narrower than last week. There is a little bit of slough. He has his appointment in podiatry later today. 06/23/2022: The patient met with Dr. Loel Lofty last week and unfortunately, there are no reconstructive options that he believes would be helpful. He did order an MRI to evaluate for osteomyelitis and fortunately, none was seen. The left sided wound is a little bit shorter and narrower today. The right sided wound is about the same. There is callus and eschar accumulation bilaterally. 06/29/2022: Both feet have improved from last week. There is epithelium making a valiant effort to creep  across the surface on the left. The right side looks like it got a little dry and the deep crevasse in his midfoot has cracked. Both have eschar and there is some slough on the left. 07/07/2022: Both feet have improved. There is epithelium completely covering the calcaneus on the right with just a small opening in the crevasse in his midfoot. On the left, the open area of tissue is smaller but he continues to build up callus/eschar and slough. 07/15/2022: The opening in the midfoot on the right is about the same size, covered with eschar and a little bit of slough. The open portion of the left wound is narrower and shorter with a bit of slough buildup. He admits to being on his feet more than recommended. 12/14; as far as I can tell everything on the right foot is closed. There is some eschar I removed some of this I cannot identify any open wound here. As usual this will be a  very vulnerable area going forward. On the left this looks really quite healthy. I was pleasantly surprised to see how good this looked. Wound is certainly smaller and there appears to be healthy epithelialization. He has been using Promogran on the right and endoform on the left. He has been offloading the right foot with a heel offloading boot and he has a running shoe on the right foot 08/04/2022: The right foot remains closed. He has a thick cushioned insole in his sneaker. The left sided wound is smaller with just some slough and eschar accumulation. He is wearing the heel off loader on this foot. Electronic Signature(s) Signed: 08/04/2022 9:56:15 AM By: Fredirick Maudlin MD FACS Entered By: Fredirick Maudlin on 08/04/2022 09:56:15 -------------------------------------------------------------------------------- Physical Exam Details Patient Name: Date of Service: [name], [place] 08/04/2022 9:30 A M Medical Record Number: [id number] Patient Account Number: [id number] Date of Birth/Sex: Treating RN: Jan 29, 1976 (46 y.o.  M) Primary Care Provider: [name] Other Clinician: Referring Provider: Treating Provider/Extender: [name] in Treatment: 107 Constitutional . . . . No acute distress. Respiratory SOSAIA, PITTINGER ([id number]) [id number] Page 6 of 15 Normal work of breathing on room air. Notes 08/04/2022: The right foot remains closed. The left sided wound is smaller with just some slough and eschar accumulation. Electronic Signature(s) Signed: 08/04/2022 9:57:05 AM By: Fredirick Maudlin MD FACS Entered By: Fredirick Maudlin on 08/04/2022 09:57:04 -------------------------------------------------------------------------------- Physician Orders Details Patient Name: Date of Service: [name], [place] 08/04/2022 9:30 A M Medical Record Number: [id number] Patient Account Number: [id number] Date of Birth/Sex: Treating RN: [birth year] (46 y.o. [name] Primary Care Provider: [name] Other Clinician: Referring Provider: Treating Provider/Extender: [name] in Treatment: [phone] Verbal / Phone Orders: No Diagnosis Coding ICD-10 Coding Code Description L97.512 Non-pressure chronic ulcer of other part of right foot with fat layer exposed L97.522 Non-pressure chronic ulcer of other part of left foot with fat layer exposed Follow-up Appointments ppointment in 1 week. - Dr. [name] Room 3 Return A Anesthetic Wound #2 Left Calcaneus (In clinic) Topical Lidocaine 4% applied to wound bed - In clinic Edema Control - Lymphedema / SCD / Other Bilateral Lower Extremities Avoid standing for long periods of time. Moisturize legs daily. - as needed Off-Loading Other: - keep pressure off of the bottom of your feet. Globoped to left foot-when not wearing the cast Elevate legs throughout the day Additional Orders / Instructions Follow Nutritious Diet Wound Treatment Wound #2 - Calcaneus Wound Laterality: Left Cleanser:  Normal Saline (Generic) Every Other Day/30 Days Discharge Instructions: Cleanse the wound with Normal Saline prior to applying a clean dressing using gauze sponges, not tissue or cotton balls. Cleanser: Wound Cleanser (DME) (Generic) Every Other Day/30 Days Discharge Instructions: Cleanse the wound with wound cleanser prior to applying a clean dressing using gauze sponges, not tissue or cotton balls. Prim Dressing: Endoform 2x2 in (DME) (Generic) Every Other Day/30 Days ary Discharge Instructions: Moisten with saline or Hydrogel Secondary Dressing: Optifoam Non-Adhesive Dressing, 4x4 in (DME) (Generic) Every Other Day/30 Days Discharge Instructions: Apply over primary dressing as directed. Secondary Dressing: Woven Gauze Sponge, Non-Sterile 4x4 in (DME) (Generic) Every Other Day/30 Days Discharge Instructions: Apply over primary dressing as directed. Secured With: 11M Medipore H Soft Cloth Surgical T ape, 4 x 10 (in/yd) (DME) (Generic) Every Other Day/30 Days Discharge Instructions: Secure with tape as directed. TAEVION, SIKORA ([id number]) [id number] Page 7 of 15 Electronic Signature(s) Signed: 08/04/2022 10:27:26 AM By:  Fredirick Maudlin MD FACS Signed: 08/04/2022 12:04:04 PM By: [name] Entered By: [name] on 08/04/2022 10:04:16 -------------------------------------------------------------------------------- Problem List Details Patient Name: Date of Service: [name], [place] 08/04/2022 9:30 A M Medical Record Number: [id number] Patient Account Number: [id number] Date of Birth/Sex: Treating RN: 10/30/1975 (46 y.o. M) Primary Care Provider: [name] Other Clinician: Referring Provider: Treating Provider/Extender: [name] in Treatment: 107 Active Problems ICD-10 Encounter Code Description Active Date MDM Diagnosis L97.512 Non-pressure chronic ulcer of other part of right foot with fat layer exposed  09/03/2020 No Yes L97.522 Non-pressure chronic ulcer of other part of left foot with fat layer exposed 09/03/2020 No Yes Inactive Problems ICD-10 Code Description Active Date Inactive Date L89.893 Pressure ulcer of other site, stage 3 07/15/2020 07/15/2020 M62.81 Muscle weakness (generalized) 07/15/2020 07/15/2020 I10 Essential (primary) hypertension 07/15/2020 07/15/2020 M86.171 Other acute osteomyelitis, right ankle and foot 09/03/2020 09/03/2020 Resolved Problems Electronic Signature(s) Signed: 08/04/2022 9:52:39 AM By: Fredirick Maudlin MD FACS Entered By: Fredirick Maudlin on 08/04/2022 09:52:39 -------------------------------------------------------------------------------- Progress Note Details Patient Name: Date of Service: [name], [place] 08/04/2022 9:30 A M Medical Record Number: [id number] Patient Account Number: [id number] Date of Birth/Sex: Treating RN: 1976/07/18 (46 y.o. M) Primary Care Provider: [name] Other Clinician: Referring Provider: Treating Provider/Extender: Halsey, Hammen ([id number]) [id number] Page 8 of 15 Weeks in Treatment: 107 Subjective Chief Complaint Information obtained from Patient Bilateral Plantar Foot Ulcers History of Present Illness (HPI) Wounds are12/03/2020 upon evaluation today patient presents for initial inspection here in our clinic concerning issues he has been having with the bottoms of his feet bilaterally. He states these actually occurred as wounds when he was hospitalized for 5 months secondary to Covid. He was apparently with tilting bed where he was in an upright position quite frequently and apparently this occurred in some way shape or form during that time. Fortunately there is no sign of active infection at this time. No fevers, chills, nausea, vomiting, or diarrhea. With that being said he still has substantial wounds on the plantar aspects of his feet Theragen require  quite a bit of work to get these to heal. He has been using Santyl currently though that is been problematic both in receiving the medication as well as actually paid for it as it is become quite expensive. Prior to the experience with Covid the patient really did not have any major medical problems other than hypertension he does have some mild generalized weakness following the Covid experience. 07/22/2020 on evaluation today patient appears to be doing okay in regard to his foot ulcers I feel like the wound beds are showing signs of better improvement that I do believe the Iodoflex is helping in this regard. With that being said he does have a lot of drainage currently and this is somewhat blue/green in nature which is consistent with Pseudomonas. I do think a culture today would be appropriate for [place] to evaluate and see if that is indeed the case I would likely start him on antibiotic orally as well he is not allergic to Cipro knows of no issues he has had in the past 12/21; patient was admitted to the clinic earlier this month with bilateral presumed pressure ulcers on the bottom of his feet apparently related to excessive pressure from a tilt table arrangement in the intensive care unit. Patient relates this to being on ECMO but I am not really sure that is exactly related to  that. I must say I have never seen anything like this. He has fairly extensive full-thickness wounds extending from his heel towards his midfoot mostly centered laterally. There is already been some healing distally. He does not appear to have an arterial issue. He has been using gentamicin to the wound surfaces with Iodoflex to help with ongoing debridement 1/6; this is a patient with pressure ulcers on the bottom of his feet related to excessive pressure from a standing position in the intensive care unit. He is complaining of a lot of pain in the right heel. He is not a diabetic. 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 We have been using Iodoflex to help prepare the surfaces of both wounds for an advanced treatment product. He is nonambulatory spending most of his time in a wheelchair I have asked him not to propel the wheelchair with his heels 1/13; in general his wounds look better not much surface area change we have been using Iodoflex as of last week. I did an x-ray of the right heel as the patient was complaining of pain. I had some thoughts about a stress fracture perhaps Achilles tendon problems however what it showed was erosive changes along the inferior aspect of the calcaneus he now has a MRI booked for 1/20. 1/20; in general his wounds continue to be better. Some improvement in the large narrow areas proximally in his foot. He is still complaining of pain in the right heel and tenderness in certain areas of this wound. His MRI is tonight. I am not just looking for osteomyelitis that was brought up on the x-ray I am wondering about stress fractures, tendon ruptures etc. He has no such findings on the left. Also noteworthy is that the patient had critical illness neuropathy and some of the discomfort may be actual improvement in nerve function I am just not sure. These wounds were initially in the setting of severe critical illness related to COVID-19. He was put in a standing position. He may have also been on pressors at the point contributing to tissue ischemia. By his description at some point these wounds were grossly necrotic extending proximally up into the Achilles part of his heel. I do not know that I have ever really seen pictures of them like this although they may exist in epic We have ordered Tri layer Oasis. I am trying to stimulate some granulation in these areas. This is of course assuming the MRI is negative for infection 1/27; since the patient was last here he saw Dr. Juleen China of infectious disease. He is planned for vancomycin and ceftriaxone. Prior  operative culture grew MSSA. Also ordered baseline lab work. He also ordered arterial studies although the ABIs in our clinic were normal as well as his clinical exam these were normal I do not think he needs to see vascular surgery. His ABIs at the P[facility] were 1.22 in the right triphasic waveforms with a normal TBI of 1.15 on the left ABI of 1.22 with triphasic waveforms and a normal TBI of 1.08. Finally he saw Dr. Amalia Hailey who will follow him in for 2 months. At this point I do not think he felt that he needed a procedure on the right calcaneal bone. Dr. Juleen China is elected for broad-spectrum antibiotic The patient is still having pain in the right heel. He walks with a walker 2/3; wounds are generally smaller. He is tolerating his IV antibiotics. I believe this is vancomycin and ceftriaxone. We are  still waiting for Oasis burn in terms of his out-of-pocket max which he should be meeting soon given the IV antibiotics, MRIs etc. I have asked him to check in on this. We are using silver collagen in the meantime the wounds look better 2/10; tolerating IV vancomycin and Rocephin. We are waiting to apply for Oasis. Although I am not really sure where he is in his out-of-pocket max. 2/17 started the first application of Oasis trilayer. Still on antibiotics. The wounds have generally look better. The area on the left has a little more surface slough requiring debridement 1/65; second application of Oasis trilayer. The wound surface granulation is generally look better. The area on the left with undermining laterally I think is come in a bit. 10/08/2020 upon evaluation today patient is here today for [employer] application #3. Fortunately he seems to be doing extremely well with regard to this and we are seeing a lot of new epithelial growth which is great news. Fortunately there is no signs of active infection at this time. 10/16/2020 upon evaluation today patient appears to be doing well with regard to his  foot ulcers. Do believe the Oasis has been of benefit for him. I do not see any signs of infection right now which is great news and I think that he has a lot of new epithelial growth which is great to see as well. The patient is very pleased to hear all of this. I do think we can proceed with the Oasis trilayer #4 today. 3/18; not as much improvement in these areas on his heels that I was hoping. I did reapply trilateral Oasis today the tissue looks healthier but not as much fill in as I was hoping. 3/25; better looking today I think this is come in a bit the tissue looks healthier. Triple layer Oasis reapplied #6 4/1; somewhat better looking definitely better looking surface not as much change in surface area as I was hoping. He may be spending more time [place] on days then he needs to although he does have heel offloading boots. Triple layer Oasis reapplied #7 4/7; unfortunately apparently [facility] will not approve any further Oasis which is unfortunate since the patient did respond nicely both in terms of the condition of the wound bed as well as surface area. There is still some drainage coming from the wound but not a lot there does not appear to be any infection 4/15; we have been using Hydrofera Blue. He continues to have nice rims of epithelialization on the right greater than the left. The left the epithelialization is coming from the tip of his heel. There is moderate drainage. In this that concerns me about a total contact cast. There is no evidence of infection 4/29; patient has been using Hydrofera Blue with dressing changes. He has no complaints or issues today. 5/5; using Hydrofera Blue. I actually think that he looks marginally better than the last time I saw this 3 weeks ago. There are rims of epithelialization on the left thumb coming from the medial side on the right. Using Hydrofera Blue 5/12; using Hydrofera Blue. These continue to make improvements in surface area.  His drainage was not listed as severe I therefore went ahead and put a cast on the left foot. Right foot we will continue to dress his previous 5/16; back for first total contact cast change. He did not tolerate this particularly well cast injury on the anterior tibia among other issues. Difficulty sleeping. I  CAPERS, HAGMANN ([id number]) [id number] Page 9 of 15 talked him about this in some detail and afterwards is elected to continue. I told him I would like to have a cast on for 3 weeks to see if this is going to help at all. I think he agreed 5/19; I think the wound is better. There is no tunneling towards his midfoot. The undermining medially also looks better. He has a rim of new skin distally. I think we are making progress here. The area on the left also continues to look somewhat better to me using Hydrofera Blue. He has a list of complaints about the cast but none of them seem serious 5/26; patient presents for 1 week follow-up. He has been using a total contact cast and tolerating this well. Hydrofera Blue is the main dressing used. He denies signs of infection. 6/2 Hydrofera Blue total contact cast on the left. These were large ulcers that formed in intensive care unit where the patient was recovering from [place]. May have had something to do with being ventilated in an upright positiono Pressors etc. We have been able to get the areas down considerably and a viable surface. There is some epithelialization in both sides. Note made of drainage 6/9; changed to [facility] last time because of drainage. He arrives with better looking surfaces and dimensions on the left than the right. Paradoxically the right actually probes more towards his midfoot the left is largely close down but both of these look improved. Using a total contact cast on the left 6/16; complex wounds on his bilateral plantar heels which were initially pressure injury from a stay in the ICU  with COVID. We have been using silver alginate most recently. His dimensions of come in quite dramatically however not recently. We have been putting the left foot in a total contact cast 6/23; complex wounds on the bilateral plantar heels. I been putting the left in the cast paradoxically the area on the right is the one that is going towards closure at a faster rate. Quite a bit of drainage on the left. The patient went to see Dr. Amalia Hailey who said he was going to standby for skin grafts. I had actually considered sending him for skin grafts however he would be mandatorily off his feet for a period of weeks to months. I am thinking that the area on the right is going to close on its own the area on the left has been more stubborn even though we have him in a total contact cast 6/30; took him out of a total contact cast last week is the right heel seem to be making better progress than the left where I was placing the cast. We are using silver alginate. Both wounds are smaller right greater than left 7/12; both wounds look as though they are making some progress. We are using silver alginate. Heel offloading boots 7/26; very gradual progress especially on the right. Using silver alginate. He is wearing heel offloading boots 8/18; he continues to close these wounds down very gradually. Using silver alginate. The problem polymen being definitive about this is areas of what appears to be callus around the margins. This is not a 100% of the area but certainly sizable especially on the right 9/1; bilateral plantar feet wounds secondary to prolonged pressure while being ventilated for COVID-19 in an upright position. Essentially pressure ulcers on the bottom of his feet. He is made substantial progress using silver alginate. 9/14; bilateral plantar feet  wounds secondary to prolonged pressure. Making progress using silver alginate. 9/29 bilateral plantar feet wounds secondary to prolonged pressure. I changed  him to Iodoflex last week. MolecuLight showing reddened blush fluorescence 10/11; patient presents for follow-up. He has no issues or complaints today. He denies signs of infection. He continues to use Iodoflex and antibiotic ointment to the wound beds. 10/27; 2-week follow-up. No evidence of infection. He has callus and thick dry skin around the wound margins we have been using Iodoflex and Bactroban which was in response to a moderate left MolecuLight reddish blush fluorescence. 11/10; 2-week follow-up. Wound margins again have thick callus however the measurements of the actual wound sites are a lot smaller. Everything looks reasonably healthy here. We have been using Iodoflex He was approved for prime matrix but I have elected to delay this given the improvement in the surface area. Hopefully I will not regret that decision as were getting close to the end of the year in terms of insurance payment 12/8; 2-week follow-up. Wounds are generally smaller in size. These were initially substantial wounds extending into the forefoot all the way into the heel on the bilateral plantar feet. They are now both located on the plantar heel distal aspect both of these have a lot of callus around the wounds I used a #5 curette to remove this on the right and the left also some subcutaneous debris to try and get the wound edges were using Iodoflex. He has heel offloading shoe 12/22; 2-week follow-up. Not really much improvement. He has thick callus around the outer edges of both wounds. I remove this there is some nonviable subcutaneous tissue as well. We have been using Iodoflex. Her intake nurse and myself spontaneously thought of a total contact cast I went back in May. At that time we really were not seeing much of an improvement with a cast although the wound was in a much different situation I would like to retry this in 2 weeks and I discussed this with the patient 08/12/2021; the patient has had some  improvement with the Iodoflex. The the area on the left heel plantar more improved than the right. I had to put him in a total contact cast on the left although I decided to put that off for 2 weeks. I am going to change his primary dressing to silver collagen. I think in both areas he has had some improvement most of the healing seems to be more proximal in the heel. The wounds are in the mid aspect. A lot of thick callus on the right heel however. 1/19; we are using silver collagen on both plantar heel areas. He has had some improvement today. The left did not require any debridement. He still had some eschar on the right that was debrided but both seem to have contracted. I did not put it total contact cast on him today 2/2 we have been using silver collagen. The area on the right plantar heel has areas that appear to be epithelialized interspersed with dry flaking callus and dry skin. I removed this. This really looks better than on the other side. On the left still a large area with raised edges and debris on the surface. 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 He has no issues or complaints today. 2/9; not much change in the left foot wound with 1  week of a cast we are using silver collagen. Silver collagen on the right side. The right side has been the better wound surface. We will reapply the total contact cast on the left 2/16; not much improvement on either side I been using silver collagen with a total contact cast on the left. I'm changing the Hydrofera Blue still with a total contact cast on the left 2/23; some improvement on both sides. Disappointing that he has thick callus around the area that we are putting in a total contact cast on the left. We've been using Hydrofera Blue on both wound areas. This is a man who at essentially pressure ulcers in  addition to ischemia caused by medications to support his blood pressure (pressors) in the ICU. He was being ventilated in the standing position for severe Covid. A Shiley the wounds extended across his entire foot but are now localized to his plantar heels bilaterally. We have made progress however neither areas healed. I continue to think the total contact cast is helped albeit painstakingly slowly. He has never wanted a plastic surgery consult although I don't know that they would be interested in grafting in area in this location. 10/07/2021: Continued improvement bilaterally. There is still some callus around the left wound, despite the total contact cast. He has some increased pain in his right midfoot around 1 particular area. This has been painful in the past but seems to be a little bit worse. When his cast was removed today, there was an area on the heel of the left foot that looks a bit macerated. He is also complaining of pain in his left thigh and hip which he thinks is secondary to the limb length discrepancy caused by the cast. 10/14/2021: He continues to improve. A little bit less callus around the left wound. He continues to endorse pain in his right midfoot, but this is not as significant as it was last week. The maceration on his left heel is improved. 10/21/2021: Continued improvement to both wounds. The maceration on his left heel is no longer evident. Less callus bilaterally. Epithelialization progressing. 10/28/2021: Significant improvement this week. The right sided wound is nearly closed with just a small open area at the middle. No maceration seen on the left heel. Continued epithelialization on both sides. No concern for infection. 11/04/2021: T oday, the wounds were measured a little bit differently and come out as larger, but I actually think they are about the same to potentially even smaller, particularly on the left. He continues to accumulate some callus on the  right. 11/11/2021: T oday, the patient is expressing some concern that the left wound, despite being in the total contact cast, is not progressing at the same rate as the 1 on the right. He is interested in trying a week without the cast to see how the wound does. The wounds are roughly the same size as last week, with the right SREEKAR, BROYHILL ([id number]) [phone].pdf Page 10 of 15 perhaps being a little bit smaller. He continues to build up callus on both sites. 11/18/2021: Last week, I permitted the patient to go without his total contact cast, just to see if the cast was really making any difference. Today, both wounds have deteriorated to some extent, suggesting that the cast is providing benefit, at least on the left. Both are larger and have accumulated callus, slough, and other debris. 11/26/2021: I debrided both wounds quite aggressively last week in an effort to stimulate the healing cascade.  This appears to have been effective as the left sided wound is a full centimeter shorter in length. Although the right was measured slightly larger, on inspection, it looks as though an area of epithelialized tissue was included in the measurements. We have been using PolyMem Ag on the wound surfaces with a total contact cast to the left. 12/02/2021: It appears that the intake personnel are including epithelialized tissue in his wound measurements; the right wound is almost completely epithelialized; there is just a crater at the proximal midfoot with some open areas. On the left, he has built up some callus, but the overall wound surface looks good. There is some senescent skin around the wound margin. He has been in PolyMem Ag bilaterally with a total contact cast on the left. 12/09/2021: The right wound is nearly closed; there is just a small open area at the mid calcaneus. On the left, the wound is smaller with minimal callus buildup. No significant drainage. 12/16/2021: The  right calcaneal wound remains minimally open at the mid calcaneus; the rest has epithelialized. On the left, the wound is also a little bit smaller. There is some senescent tissue on the periphery. He is getting his first application of a trial skin substitute called Vendaje today. 12/23/2021: The wound on his right calcaneus is nearly closed; there is just a small area at the most distal aspect of the calcaneus that is open. On the left, the area where we applied to the skin substitute has a healthier-looking bed of granulation tissue. The wound dimensions are not significantly different on this side but the wound surface is improved. 12/30/2021: The wound on the right calcaneus has not changed significantly aside from some accumulation of callus. On the left, the open area is smaller and continues to have an improved surface. He continues to accumulate callus around the wound. He is here for his third application of Vendaje. 01/06/2022: The right calcaneal wound is down to just a couple of millimeters. He continues to accumulate periwound callus. He unfortunately got his cast wet earlier in the week and his left foot is macerated, resulting in some superficial skin loss just distal to the open wound. The open wound itself, however, is much smaller and has a healthier appearing surface. He is here for his fourth application of Vendaje. 01/13/2022: The right calcaneal wound is about the same. Unfortunately, once again, his cast got wet and his foot is again macerated. This is caused the left calcaneal wound to enlarge. He is here for his fifth application of Vendaje. 01/20/2022: The right calcaneal wound is very small. There is some periwound callus accumulation. He purchased a new cast protector last week and this has been effective in avoiding the maceration that has been occurring on the left. The left calcaneal wound is narrower and has a healthy and viable-appearing surface. He is here for his 6  application of Vendaje. 01/27/2022: The right calcaneal wound is down to just a pinhole. There is some periwound slough and callus. On the left, the wound is narrower and shorter by about a centimeter. The surface is robust and viable-appearing. Unfortunately, the rep for the trial skin substitute product did not provide any for [place] to use today. 02/04/2022: The right calcaneal wound remains unchanged. There is more accumulated callus. On the left, although the intake nurse measured it a little bit longer, it looks about the same to me. The surface has a layer of slough, but underneath this, there is good granulation tissue.  02/10/2022: The right calcaneus wound is nearly closed. There is still some callus that builds up around the site. The left side looks about the same in terms of dimensions, but the surface is more robust and vital-appearing. 02/16/2022: The area of the right calcaneus that was nearly closed last week has closed, but there is a small opening at the mid foot where it looks like some moisture got retained and caused some reopening. The left foot wound is narrower and shallower. Both sites have a fair amount of periwound callus and eschar. 02/24/2022: The small midfoot opening on the right calcaneus is a little bit smaller today. The left foot wound is narrower and shallower. He continues to accumulate periwound callus. No concern for infection. 03/01/2022: The patient came to clinic early because he showered and got his cast wet. Fortunately, there is no significant maceration to his foot but the callus softened and it looks like the wound on his left calcaneus may be a little bit wider. The wound on his right calcaneus is just a narrow slit. Continued accumulation of periwound callus bilaterally. 03/08/2022: The wound on his right calcaneus is very nearly closed, just a small pinpoint opening under a bit of eschar; the left wound has come in quite a bit, as well. It is narrower and shorter  than at our last visit. Still with accumulated callus and eschar bilaterally. 03/17/2022: The right calcaneal wound is healed. The left wound is smaller and the surface itself is very clean, but there is some blue-green staining on the periwound callus, concerning for Pseudomonas aeruginosa. 03/23/2022: The right calcaneal wound remains closed. The left wound continues to contract. No further blue-green staining. Small amount of callus and slough accumulation. 03/28/2022: He came in early today because he had gotten his cast wet. On inspection, the wound itself did not get wet or macerated, just a little bit of the forefoot. The wound itself is basically unchanged. 04/07/2022: The right foot wound remains closed. The left wound is the smallest that I have seen it to date. It is narrower and shorter. It still continues to accumulate slough on the surface. 04/15/2022: There is a band of epithelium now dividing the small left plantar foot wound in 2. There is still some slough on the surface. 04/21/2022: The wound continues to narrow. Just a little bit of slough on the surface. He seems to be responding well to endoform. 04/28/2022: Continued slow contraction of the wound. There is a little slough on the surface and some periwound callus. We have been using endoform and total contact cast. 05/05/2022: The wound appears to have stalled. There is slough and some periwound eschar/callus. No concern for infection, however. 05/12/2022: Unfortunately, his right foot has reopened. It is located at the most posterior aspect of his surgical incision. The area was noted to have drainage coming from it when his padding was removed today. Underneath some callus and senescent skin, there is an opening. No purulent drainage or malodor. On the left foot, the wound is again unchanged. There is some light blue staining on the callus, but no malodor or purulent drainage. 10/13; right and left heel remanence of extensive plantar  foot wounds. These are better than I remember by quite a big margin however he is still left with wounds on the left plantar heel and the right plantar heel. Been using endoform bilaterally. A culture was done that showed apparently Pseudomonas but we are still waiting for the [facility] antibiotic to use gentamicin today.  He is still very active by description I am not sure about the offloading of his noncasted right foot 10/20; both wounds right and left heel debrided not much change from last week. [employer] has arrived which is linezolid, gentamicin and ciprofloxacin we will use this with endoform. T contact cast on the left ASHE, GAGO ([id number]) [id number] Page 11 of 15 06/02/2022: Both wounds are smaller today. There is still a fair amount of callus buildup around the right foot ulcer. The left is more superficial and nearly flush with the surrounding tissues. Also with slough and eschar buildup. 06/10/2022: The right sided wound appears to be nearly closed, if not completely so, although it is somewhat difficult to tell given the abnormal tissue and scarring in his foot. There is a fair amount of callus and crust accumulation. On the left, the wound looks about the same, again with callus and slough. He has an appointment next Thursday with Dr. Loel Lofty in podiatry; I am hopeful that there may be some reconstructive options available for Mr. Edmiston. 06/16/2022: Both wounds have some eschar and callus accumulation. The right sided wound is extremely narrow and barely open; the left is narrower than last week. There is a little bit of slough. He has his appointment in podiatry later today. 06/23/2022: The patient met with Dr. Loel Lofty last week and unfortunately, there are no reconstructive options that he believes would be helpful. He did order an MRI to evaluate for osteomyelitis and fortunately, none was seen. The left sided wound is a little bit  shorter and narrower today. The right sided wound is about the same. There is callus and eschar accumulation bilaterally. 06/29/2022: Both feet have improved from last week. There is epithelium making a valiant effort to creep across the surface on the left. The right side looks like it got a little dry and the deep crevasse in his midfoot has cracked. Both have eschar and there is some slough on the left. 07/07/2022: Both feet have improved. There is epithelium completely covering the calcaneus on the right with just a small opening in the crevasse in his midfoot. On the left, the open area of tissue is smaller but he continues to build up callus/eschar and slough. 07/15/2022: The opening in the midfoot on the right is about the same size, covered with eschar and a little bit of slough. The open portion of the left wound is narrower and shorter with a bit of slough buildup. He admits to being on his feet more than recommended. 12/14; as far as I can tell everything on the right foot is closed. There is some eschar I removed some of this I cannot identify any open wound here. As usual this will be a very vulnerable area going forward. On the left this looks really quite healthy. I was pleasantly surprised to see how good this looked. Wound is certainly smaller and there appears to be healthy epithelialization. He has been using Promogran on the right and endoform on the left. He has been offloading the right foot with a heel offloading boot and he has a running shoe on the right foot 08/04/2022: The right foot remains closed. He has a thick cushioned insole in his sneaker. The left sided wound is smaller with just some slough and eschar accumulation. He is wearing the heel off loader on this foot. Patient History Information obtained from Patient. Family History Cancer - Maternal Grandparents, Diabetes - Father,Paternal Grandparents, Heart Disease - Maternal Grandparents, Hypertension -  Father,Paternal Grandparents, Lung Disease - Siblings, No family history of Hereditary Spherocytosis, Kidney Disease, Seizures, Stroke, Thyroid Problems, Tuberculosis. Social History Never smoker, Marital Status - Married, Alcohol Use - Never, Drug Use - No History, Caffeine Use - Daily - tea, soda. Medical History Eyes Denies history of Cataracts, Glaucoma, Optic Neuritis Ear/Nose/Mouth/Throat Denies history of Chronic sinus problems/congestion, Middle ear problems Hematologic/Lymphatic Denies history of Anemia, Hemophilia, Human Immunodeficiency Virus, Lymphedema, Sickle Cell Disease Respiratory Patient has history of Asthma Denies history of Aspiration, Chronic Obstructive Pulmonary Disease (COPD), Pneumothorax, Sleep Apnea, Tuberculosis Cardiovascular Patient has history of Angina - with COVID, Hypertension Denies history of Arrhythmia, Congestive Heart Failure, Coronary Artery Disease, Deep Vein Thrombosis, Hypotension, Myocardial Infarction, Peripheral Arterial Disease, Peripheral Venous Disease, Phlebitis, Vasculitis Gastrointestinal Denies history of Cirrhosis , Colitis, Crohnoos, Hepatitis A, Hepatitis B, Hepatitis C Endocrine Denies history of Type I Diabetes, Type II Diabetes Genitourinary Denies history of End Stage Renal Disease Immunological Denies history of Lupus Erythematosus, Raynaudoos, Scleroderma Integumentary (Skin) Denies history of History of Burn Musculoskeletal Denies history of Gout, Rheumatoid Arthritis, Osteoarthritis, Osteomyelitis Neurologic Denies history of Dementia, Neuropathy, Quadriplegia, Paraplegia, Seizure Disorder Oncologic Denies history of Received Chemotherapy, Received Radiation Psychiatric Denies history of Anorexia/bulimia, Confinement Anxiety Hospitalization/Surgery History - COVID PNA 07/22/2019- 11/14/2019. - 03/27/2020 wound debridement/ skin graft. Medical A Surgical History Notes nd Constitutional Symptoms (General  Health) COVID PNA 07/22/2019-11/14/2019 VENT ECMO, foot drop left foot , Genitourinary kidney stone Psychiatric anxiety HELIODORO, DOMAGALSKI ([id number]) [id number] Page 12 of 15 Objective Constitutional No acute distress. Vitals Time Taken: 9:29 AM, Height: 69 in, Weight: 280 lbs, BMI: 41.3, Temperature: 98.4 F, Pulse: 98 bpm, Respiratory Rate: 20 breaths/min, Blood Pressure: 105/75 mmHg. Respiratory Normal work of breathing on room air. General Notes: 08/04/2022: The right foot remains closed. The left sided wound is smaller with just some slough and eschar accumulation. Integumentary (Hair, Skin) Wound #2 status is Open. Original cause of wound was Pressure Injury. The date acquired was: 10/07/2019. The wound has been in treatment 107 weeks. The wound is located on the Left Calcaneus. The wound measures 0.7cm length x 0.2cm width x 0.1cm depth; 0.11cm^2 area and 0.011cm^3 volume. There is Fat Layer (Subcutaneous Tissue) exposed. There is no tunneling or undermining noted. There is a medium amount of serosanguineous drainage noted. The wound margin is thickened. There is large (67-100%) red, pink granulation within the wound bed. There is a small (1-33%) amount of necrotic tissue within the wound bed including Eschar and Adherent Slough. The periwound skin appearance had no abnormalities noted for color. The periwound skin appearance exhibited: Callus, Scarring, Maceration. Periwound temperature was noted as No Abnormality. 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 There was a Excisional Skin/Subcutaneous Tissue Debridement with a total area of 0.14 sq cm performed by Fredirick Maudlin, MD. With the following  instrument(s): Curette to remove Non-Viable tissue/material. Material removed includes Eschar, Subcutaneous Tissue, and Slough after achieving pain control using Lidocaine 5% topical ointment. No specimens were taken. A time out was conducted at 09:48, prior to the start of the procedure. A Minimum amount of bleeding was controlled with Pressure. The procedure was tolerated well with a pain level of 0 throughout and a pain level of 0 following the procedure. Post Debridement Measurements:  0.7cm length x 0.2cm width x 0.1cm depth; 0.011cm^3 volume. Post debridement Stage noted as Category/Stage III. Character of Wound/Ulcer Post Debridement is improved. Post procedure Diagnosis Wound #2: Same as Pre-Procedure General Notes: Scribed for Dr. [name] by J.Scotton. Plan Follow-up Appointments: Return Appointment in 1 week. - Dr. [name] Room 3 Anesthetic: Wound #2 Left Calcaneus: (In clinic) Topical Lidocaine 4% applied to wound bed - In clinic Edema Control - Lymphedema / SCD / Other: Avoid standing for long periods of time. Moisturize legs daily. - as needed Off-Loading: Other: - keep pressure off of the bottom of your feet. Globoped to left foot-when not wearing the cast Elevate legs throughout the day Additional Orders / Instructions: Follow Nutritious Diet WOUND #2: - Calcaneus Wound Laterality: Left Cleanser: Normal Saline (Generic) 1 x Per Day/30 Days Discharge Instructions: Cleanse the wound with Normal Saline prior to applying a clean dressing using gauze sponges, not tissue or cotton balls. Cleanser: Wound Cleanser (DME) (Generic) 1 x Per Day/30 Days Discharge Instructions: Cleanse the wound with wound cleanser prior to applying a clean dressing using gauze sponges, not tissue or cotton balls. Topical: keystone 1 x Per Day/30 Days Discharge Instructions: use on Left Calcaneus Prim Dressing: Endoform 2x2 in (DME) (Generic) 1 x Per Day/30 Days ary Discharge Instructions: Moisten with  saline Secondary Dressing: Optifoam Non-Adhesive Dressing, 4x4 in (DME) (Generic) 1 x Per Day/30 Days NAJEEB, UPTAIN ([id number]) [id number] Page 13 of 15 Discharge Instructions: Apply over primary dressing as directed. Secondary Dressing: Woven Gauze Sponge, Non-Sterile 4x4 in (DME) (Generic) 1 x Per Day/30 Days Discharge Instructions: Apply over primary dressing as directed. Secured With: 34M Medipore H Soft Cloth Surgical T ape, 4 x 10 (in/yd) (DME) (Generic) 1 x Per Day/30 Days Discharge Instructions: Secure with tape as directed. 08/04/2022: The right foot remains closed. He has a thick cushioned insole in his sneaker. The left sided wound is smaller with just some slough and eschar accumulation. He is wearing the heel off loader on this foot. I used a curette to debride slough, eschar, and nonviable subcutaneous tissue from the left sided wound. We will continue endoform, Keystone topical antibiotic, and heel off loader. Follow-up in 1 week Electronic Signature(s) Signed: 08/04/2022 9:58:32 AM By: Fredirick Maudlin MD FACS Entered By: Fredirick Maudlin on 08/04/2022 09:58:32 -------------------------------------------------------------------------------- HxROS Details Patient Name: Date of Service: [name], [place] 08/04/2022 9:30 A M Medical Record Number: [id number] Patient Account Number: [id number] Date of Birth/Sex: Treating RN: [birth year] (46 y.o. M) Primary Care Provider: [name] Other Clinician: Referring Provider: Treating Provider/Extender: [name] in Treatment: 107 Information Obtained From Patient Constitutional Symptoms (General Health) Medical History: Past Medical History Notes: COVID PNA 07/22/2019-11/14/2019 VENT ECMO, foot drop left foot , Eyes Medical History: Negative for: Cataracts; Glaucoma; Optic Neuritis Ear/Nose/Mouth/Throat Medical History: Negative for: Chronic sinus problems/congestion;  Middle ear problems Hematologic/Lymphatic Medical History: Negative for: Anemia; Hemophilia; Human Immunodeficiency Virus; Lymphedema; Sickle Cell Disease Respiratory Medical History: Positive for: Asthma Negative for: Aspiration; Chronic Obstructive Pulmonary Disease (COPD); Pneumothorax; Sleep Apnea; Tuberculosis Cardiovascular Medical History: Positive for: Angina - with COVID; Hypertension Negative for: Arrhythmia; Congestive Heart Failure; Coronary Artery Disease; Deep Vein Thrombosis; Hypotension; Myocardial Infarction; Peripheral Arterial Disease; Peripheral Venous Disease; Phlebitis; Vasculitis Gastrointestinal Medical History: Negative for: Cirrhosis ; Colitis; Crohns; Hepatitis A; Hepatitis B; Hepatitis TRAYCEN, GOYER ([id number]) [id number] Page 14 of 15 Endocrine Medical History: Negative for: Type I Diabetes; Type II Diabetes Genitourinary Medical History: Negative for: End Stage  Renal Disease Past Medical History Notes: kidney stone Immunological Medical History: Negative for: Lupus Erythematosus; Raynauds; Scleroderma Integumentary (Skin) Medical History: Negative for: History of Burn Musculoskeletal Medical History: Negative for: Gout; Rheumatoid Arthritis; Osteoarthritis; Osteomyelitis Neurologic Medical History: Negative for: Dementia; Neuropathy; Quadriplegia; Paraplegia; Seizure Disorder Oncologic Medical History: Negative for: Received Chemotherapy; Received Radiation Psychiatric Medical History: Negative for: Anorexia/bulimia; Confinement Anxiety Past Medical History Notes: anxiety Immunizations Pneumococcal Vaccine: Received Pneumococcal Vaccination: No Implantable Devices None Hospitalization / Surgery History Type of Hospitalization/Surgery COVID PNA 07/22/2019- 11/14/2019 03/27/2020 wound debridement/ skin graft Family and Social History Cancer: Yes - Maternal Grandparents; Diabetes: Yes - Father,Paternal  Grandparents; Heart Disease: Yes - Maternal Grandparents; Hereditary Spherocytosis: No; Hypertension: Yes - Father,Paternal Grandparents; Kidney Disease: No; Lung Disease: Yes - Siblings; Seizures: No; Stroke: No; Thyroid Problems: No; Tuberculosis: No; Never smoker; Marital Status - Married; Alcohol Use: Never; Drug Use: No History; Caffeine Use: Daily - tea, soda; Financial Concerns: No; Food, Clothing or Shelter Needs: No; Support System Lacking: No; Transportation Concerns: No Electronic Signature(s) Signed: 08/04/2022 10:27:26 AM By: Fredirick Maudlin MD FACS Entered By: Fredirick Maudlin on 08/04/2022 09:56:21 [name] ([id number]) [id number] Page 15 of 15 -------------------------------------------------------------------------------- SuperBill Details Patient Name: Date of Service: [name] [place] 08/04/2022 Medical Record Number: [id number] Patient Account Number: [id number] Date of Birth/Sex: Treating RN: Aug 03, 1976 (46 y.o. M) Primary Care Provider: [name] Other Clinician: Referring Provider: Treating Provider/Extender: [name] in Treatment: 107 Diagnosis Coding ICD-10 Codes Code Description [phone] Non-pressure chronic ulcer of other part of right foot with fat layer exposed L97.522 Non-pressure chronic ulcer of other part of left foot with fat layer exposed Facility Procedures : CPT4 Code: [id number] Description: [place] TISSUE 20 SQ CM/< ICD-10 Diagnosis Description L97.522 Non-pressure chronic ulcer of other part of left foot with fat layer exposed Modifier: Quantity: 1 Physician Procedures : CPT4 Code Description Modifier [id number] - WC PHYS LEVEL 4 - EST PT 25 ICD-10 Diagnosis Description L97.522 Non-pressure chronic ulcer of other part of left foot with fat layer exposed Quantity: 1 : [id number] - WC PHYS SUBQ TISS 20 SQ CM ICD-10 Diagnosis Description L97.522 Non-pressure chronic ulcer  of other part of left foot with fat layer exposed Quantity: 1 Electronic Signature(s) Signed: 08/04/2022 9:58:56 AM By: Fredirick Maudlin MD FACS Entered By: Fredirick Maudlin on 08/04/2022 09:58:56

## 2022-08-04 NOTE — ED Provider Notes (Signed)
[facility]  Note:  This document was prepared using Conservation officer, historic buildings and may include unintentional dictation errors.  MRN: [id number] DOB: [birth year]  Subjective:   [name] is a 46 y.o. male presenting for 1 week history of persistent sinus congestion, coughing, chest congestion.  Patient has history of moderate persistent asthma.  Has his albuterol inhaler and takes Trelegy regularly.  Has a history of allergic rhinitis as well.  No overt chest pain, shortness of breath or wheezing.  Has a history of COVID-19 that left scarring in his left lung per patient.  History of pneumonia from COVID.  No current facility-administered medications for this encounter.  Current Outpatient Medications:    albuterol (VENTOLIN HFA) 108 (90 Base) MCG/ACT inhaler, INHALE 2 PUFFS BY MOUTH EVERY 4 HOURS AS NEEDED, Disp: 18 g, Rfl: 1   Albuterol Sulfate (PROAIR RESPICLICK) 108 (90 Base) MCG/ACT AEPB, INHALE 2 PUFF(S) EVERY 4-6 HOURS BY INHALATION ROUTE FOR 30 DAYS., Disp: , Rfl:    alum & mag hydroxide-simeth (MAALOX MAX) 400-400-40 MG/5ML suspension, , Disp: , Rfl:    amLODipine (NORVASC) 2.5 MG tablet, Take 1 tablet (2.5 mg total) by mouth daily., Disp: 30 tablet, Rfl: 5   ARIPiprazole (ABILIFY) 2 MG tablet, 1 tablet Orally Once a day at night for 90, Disp: , Rfl:    budesonide-formoterol (SYMBICORT) 160-4.5 MCG/ACT inhaler, INHALE TWO PUFFS TWICE DAILY TO PREVENT COUGH OR WHEEZE. RINSE, GARGLE, AND SPIT AFTER USE., Disp: , Rfl:    cyclobenzaprine (FLEXERIL) 10 MG tablet, , Disp: , Rfl:    cyclobenzaprine (FLEXERIL) 5 MG tablet, , Disp: , Rfl:    diclofenac (VOLTAREN) 50 MG EC tablet, , Disp: , Rfl:    DULoxetine (CYMBALTA) 60 MG capsule, 1 capsule Orally Once a day for 90, Disp: 90 capsule, Rfl: 1   DULoxetine HCl 40 MG CPEP, TAKE TWO CAPSULES AT ONCE DAILY IN THE MORNING AFTER BREAKFAST., Disp: 180 capsule, Rfl: 0   ergocalciferol (VITAMIN D2) 1.25 MG (50000  UT) capsule, , Disp: , Rfl:    Fluticasone-Umeclidin-Vilant (TRELEGY ELLIPTA) 100-62.5-25 MCG/ACT AEPB, Inhale 1 puff into the lungs daily., Disp: 60 each, Rfl: 0   ibuprofen (ADVIL) 200 MG tablet, Take 1 tablet every 6 hours by oral route., Disp: , Rfl:    lamoTRIgine (LAMICTAL) 100 MG tablet, Take 1.5 tablets (150 mg total) by mouth daily., Disp: 30 tablet, Rfl: 3   lamoTRIgine (LAMICTAL) 25 MG tablet, TAKE TWO TABLETS BY MOUTH AT ONCE DAILY, Disp: 180 tablet, Rfl: 0   metoprolol tartrate (LOPRESSOR) 25 MG tablet, TAKE 1 TABLET BY MOUTH TWICE A DAY, Disp: 180 tablet, Rfl: 3   montelukast (SINGULAIR) 10 MG tablet, TAKE 1 TABLET BY MOUTH EVERY DAY, Disp: 90 tablet, Rfl: 1   oxyCODONE (ROXICODONE) 5 MG immediate release tablet, Take 1 tablet (5 mg total) by mouth every 6 (six) hours as needed for severe pain (max 2 tabs/day for now). Do Not Fill Before 12/ 21/2023, Disp: 60 tablet, Rfl: 0   pantoprazole (PROTONIX) 40 MG tablet, Take 1 tablet by mouth daily., Disp: , Rfl:    traZODone (DESYREL) 100 MG tablet, Take 2 tablets (200 mg total) by mouth at bedtime., Disp: 180 tablet, Rfl: 3   triamcinolone (NASACORT) 55 MCG/ACT AERO nasal inhaler, Place 2 sprays into the nose daily., Disp: , Rfl:    No Known Allergies  Past Medical History:  Diagnosis Date   Anginal pain (HCC)    with covid  Anxiety    Asthma    Dyspnea    GERD (gastroesophageal reflux disease)    Headache    History of kidney stones    LEFT URETERAL STONE   HTN (hypertension)    Pancreatitis 2018   GALLBALDDER SLUDGE CAUSED ISSUED RESOLVED   Pneumonia 07/2019   covid     Past Surgical History:  Procedure Laterality Date   CANNULATION FOR ECMO (EXTRACORPOREAL MEMBRANE OXYGENATION) N/A 08/28/2019   Procedure: CANNULATION FOR VV ECMO (EXTRACORPOREAL MEMBRANE OXYGENATION);  Surgeon: Donata Clay, Theron Arista, MD;  Location: [facility] OR;  Service: Open Heart Surgery;  Laterality: N/A;  CRESCENT CANNULA   CANNULATION FOR ECMO (EXTRACORPOREAL  MEMBRANE OXYGENATION) N/A 09/10/2019   Procedure: CANNULATION FOR ECMO (EXTRACORPOREAL MEMBRANE OXYGENATION) PUTTING IN CRESCENT 32FR CANNULA  AND REMOVING GROING CANNULATION;  Surgeon: Linden Dolin, MD;  Location: [facility] OR;  Service: Open Heart Surgery;  Laterality: N/A;  PUTTING IN CRESCENT/REMOVING GROIN CANNULATION   CYSTOSCOPY/URETEROSCOPY/HOLMIUM LASER/STENT PLACEMENT Left 04/18/2019   Procedure: LEFT URETEROSCOPY/HOLMIUM LASER/STENT PLACEMENT;  Surgeon: Crist Fat, MD;  Location: [facility];  Service: Urology;  Laterality: Left;   CYSTOSCOPY/URETEROSCOPY/HOLMIUM LASER/STENT PLACEMENT Left 05/02/2019   Procedure: CYSTOSCOPY/URETEROSCOPY/HOLMIUM LASER/STENT EXCHANGE;  Surgeon: Crist Fat, MD;  Location: [facility];  Service: Urology;  Laterality: Left;   ECMO CANNULATION N/A 08/03/2019   Procedure: ECMO CANNULATION;  Surgeon: Linden Dolin, MD;  Location: [facility];  Service: Cardiovascular;  Laterality: N/A;   ESOPHAGOGASTRODUODENOSCOPY N/A 09/11/2019   Procedure: ESOPHAGOGASTRODUODENOSCOPY (EGD);  Surgeon: Linden Dolin, MD;  Location: [facility] OR;  Service: Thoracic;  Laterality: N/A;   GRAFT APPLICATION Bilateral 03/27/2020   Procedure: APPLICATION OF SKIN GRAFT BILATERAL FEET;  Surgeon: Felecia Shelling, DPM;  Location: [facility];  Service: Podiatry;  Laterality: Bilateral;   IR REPLC GASTRO/COLONIC TUBE PERCUT W/FLUORO  10/14/2019   IRRIGATION AND DEBRIDEMENT SHOULDER Right 09/29/2017   Procedure: IRRIGATION AND DEBRIDEMENT SHOULDER;  Surgeon: Tarry Kos, MD;  Location: [facility] OR;  Service: Orthopedics;  Laterality: Right;   LUMBAR DISC SURGERY  2002   NASAL ENDOSCOPY WITH EPISTAXIS CONTROL N/A 08/31/2019   Procedure: NASAL ENDOSCOPY WITH EPISTAXIS CONTROL WITH CAUTERIZATION;  Surgeon: Christia Reading, MD;  Location: [facility] OR;  Service: ENT;  Laterality: N/A;   PORTACATH PLACEMENT N/A 09/11/2019   Procedure: PEG TUBE INSERTION - BEDSIDE;  Surgeon: Linden Dolin,  MD;  Location: [facility] OR;  Service: Thoracic;  Laterality: N/A;   TEE WITHOUT CARDIOVERSION N/A 08/28/2019   Procedure: TRANSESOPHAGEAL ECHOCARDIOGRAM (TEE);  Surgeon: Donata Clay, Theron Arista, MD;  Location: [facility] OR;  Service: Open Heart Surgery;  Laterality: N/A;   TEE WITHOUT CARDIOVERSION N/A 09/10/2019   Procedure: TRANSESOPHAGEAL ECHOCARDIOGRAM (TEE);  Surgeon: Linden Dolin, MD;  Location: [facility] OR;  Service: Open Heart Surgery;  Laterality: N/A;   WOUND DEBRIDEMENT Bilateral 03/27/2020   Procedure: EXCISIONAL DEBRIDEMENT OF ULCERS BILATERAL FEET;  Surgeon: Felecia Shelling, DPM;  Location: [facility];  Service: Podiatry;  Laterality: Bilateral;    Family History  Problem Relation Age of Onset   Asthma Brother    Diabetes Father    Hypertension Father    Diabetes Paternal Grandmother    Hypertension Paternal Grandmother    Migraines Mother    GER disease Mother    Pancreatic cancer Maternal Grandmother    Heart disease Maternal Grandfather     Social History   Tobacco Use   Smoking status: Never   Smokeless tobacco: Never  Vaping Use  Vaping Use: Never used  Substance Use Topics   Alcohol use: Yes    Comment: rarely 2-3 times a year   Drug use: No    ROS   Objective:   Vitals: BP 133/75 (BP Location: Left Arm)   Pulse 80   Temp 99 F (37.2 C) (Oral)   Resp 20   SpO2 92%   Physical Exam Constitutional:      General: He is not in acute distress.    Appearance: Normal appearance. He is well-developed. He is obese. He is not ill-appearing, toxic-appearing or diaphoretic.  HENT:     Head: Normocephalic and atraumatic.     Right Ear: External ear normal.     Left Ear: External ear normal.     Nose: Congestion present. No rhinorrhea.     Mouth/Throat:     Mouth: Mucous membranes are moist.  Eyes:     General: No scleral icterus.       Right eye: No discharge.        Left eye: No discharge.     Extraocular Movements: Extraocular movements intact.  Cardiovascular:     Rate  and Rhythm: Normal rate and regular rhythm.     Heart sounds: Normal heart sounds. No murmur heard.    No friction rub. No gallop.  Pulmonary:     Effort: Pulmonary effort is normal. No respiratory distress.     Breath sounds: Normal breath sounds. No stridor. No wheezing, rhonchi or rales.  Neurological:     Mental Status: He is alert and oriented to person, place, and time.  Psychiatric:        Mood and Affect: Mood normal.        Behavior: Behavior normal.        Thought Content: Thought content normal.     DG Chest 2 View  Result Date: 08/04/2022 CLINICAL DATA:  Cough, chest congestion x1 week EXAM: CHEST - 2 VIEW COMPARISON:  Previous chest radiographs done on 10/27/2019 and CT done on 06/12/2021 FINDINGS: Transverse diameter of heart is increased. There are linear densities in right upper lung field, both parahilar regions and both lower lung fields suggesting scarring. Similar finding was seen in the CT done on 06/12/2021. There is no focal pulmonary consolidation. There is no pleural effusion or pneumothorax. IMPRESSION: There are linear densities in both lungs, most likely scarring from chronic lung disease. There are no signs of alveolar pulmonary edema or focal pulmonary consolidation. There is no pleural effusion or pneumothorax. Electronically Signed   By: Ernie Avena M.D.   On: 08/04/2022 12:21     Assessment and Plan :   PDMP not reviewed this encounter.  1. Recurrent sinusitis   2. Moderate persistent asthma without complication   3. Allergic rhinitis, unspecified seasonality, unspecified trigger     Will address his symptoms for recurrent sinusitis.  Chest x-ray is otherwise negative.  In the context of his asthma and allergic rhinitis, recommended a oral prednisone course.  Maintain breathing treatments.  Counseled patient on potential for adverse effects with medications prescribed/recommended today, ER and return-to-clinic precautions discussed, patient  verbalized understanding.    Wallis Bamberg, PA-C 08/04/22 1234

## 2022-08-05 ENCOUNTER — Other Ambulatory Visit: Payer: Self-pay | Admitting: Physical Medicine and Rehabilitation

## 2022-08-05 MED ORDER — OXYCODONE HCL 5 MG PO TABS: 5.0000 mg | ORAL_TABLET | Freq: Four times a day (QID) | ORAL | 0 refills | Status: DC | PRN

## 2022-08-05 NOTE — Telephone Encounter (Signed)
From: [name] To: Office of Jacalyn Lefevre, [name] Sent: 08/04/2022 11:36 PM EST Subject: Medication Renewal Request  Refills have been requested for the following medications:   oxyCODONE (ROXICODONE) 5 MG immediate release tablet [Eunice Thomas]  Preferred pharmacy: [id number] - [place], [place] - [address] Delivery method: [name]

## 2022-08-09 ENCOUNTER — Encounter: Payer: Self-pay | Admitting: Allergy & Immunology

## 2022-08-09 ENCOUNTER — Ambulatory Visit (INDEPENDENT_AMBULATORY_CARE_PROVIDER_SITE_OTHER): Payer: PPO | Admitting: Allergy & Immunology

## 2022-08-09 ENCOUNTER — Other Ambulatory Visit: Payer: Self-pay

## 2022-08-09 VITALS — BP 138/72 | HR 90 | Temp 98.0°F | Resp 20 | Ht 68.0 in | Wt 314.0 lb

## 2022-08-09 DIAGNOSIS — J4540 Moderate persistent asthma, uncomplicated: Secondary | ICD-10-CM

## 2022-08-09 DIAGNOSIS — J3089 Other allergic rhinitis: Secondary | ICD-10-CM

## 2022-08-09 MED ORDER — IPRATROPIUM BROMIDE 0.06 % NA SOLN: NASAL | 1 refills | Status: DC

## 2022-08-09 MED ORDER — BREZTRI AEROSPHERE 160-9-4.8 MCG/ACT IN AERO: 2.0000 | INHALATION_SPRAY | Freq: Two times a day (BID) | RESPIRATORY_TRACT | 1 refills | Status: DC

## 2022-08-09 NOTE — Progress Notes (Signed)
FOLLOW UP  Date of Service/Encounter:  08/09/22   Assessment:   Moderate persistent asthma, uncomplicated  Absolute eosinophil count of 200 (August 2023)   Perennial and seasonal allergic rhinitis (grass, weeds, ragweed, trees, dust mites, cat, and dog)   Prolonged hospitalization for COVID19 requiring ECMO in late 2020   Fully vaccinated against COVID19  Plan/Recommendations:   1. Moderate persistent asthma, uncomplicated - Lung testing was slightly worse and did not change much with the albuterol treatment. - This might be related to your ongoing sinus infections.  - I think we need to consider doing an injectable asthma medication such as Berna Bue or Tezspire. - Information for both provided today. - They are both good medications to help the inhaler works better to control your breathing more effectively. - We will change from your Trelegy to [facility] since this is covered by your insurance this year.  - Daily controller medication(s):  Breztri two puffs twice daily with spacer - Prior to physical activity: albuterol 2 puffs 10-15 minutes before physical activity. - Rescue medications: albuterol 4 puffs every 4-6 hours as needed - Asthma control goals:  * Full participation in all desired activities (may need albuterol before activity) * Albuterol use two time or less a week on average (not counting use with activity) * Cough interfering with sleep two time or less a month * Oral steroids no more than once a year * No hospitalizations  2. Allergic rhinitis - well controlled - Continue with Nasacort one spray per nostril daily. - Continue with Atrovent nasal spray one spray per nostril every 8 hours as needed for runny nose.  - Continue with cetirizine 10mg  daily.  3. Return in about 2 months (around 10/08/2022).   Subjective:   [name] is a 47 y.o. male presenting today for follow up of  Chief Complaint  Patient presents with   Follow-up    Follow up and  refills    [name] has a history of the following: Patient Active Problem List   Diagnosis Date Noted   Obstructive sleep apnea 01/11/2022   Insomnia due to other mental disorder 12/06/2021   Left foot drop 09/06/2021   Wheelchair dependence 06/04/2021   Encounter for therapeutic drug monitoring 09/29/2020   COVID-19 long hauler manifesting chronic dyspnea 09/15/2020   Morbid obesity with body mass index of 40.0-44.9 in adult [facility]) 09/15/2020   Encounter for attention to tracheostomy ([place]) 09/15/2020   Critical illness polyneuropathy ([place]) 09/15/2020   Moderate protein-calorie malnutrition ([place]) 09/15/2020   Pyogenic inflammation of bone ([place]) 09/09/2020   Long COVID 09/09/2020   Acute osteomyelitis ([place]) 08/31/2020   MSSA (methicillin susceptible Staphylococcus aureus) infection 08/31/2020   Reactive depression 07/01/2020   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   Impaired sensation to light touch 04/15/2020   Diffuse alveolar damage ([place]) 03/09/2020   Eschar of foot    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   History of COVID-19    Pressure injury of skin 08/22/2019   Need for emotional support    Chest tube in place    Personal history of ECMO    Advanced care planning/counseling discussion    Palliative care by specialist    Goals of care, counseling/discussion    Advanced directives, counseling/discussion    Subcutaneous crepitus    Thrombocytopenia (HCC)    Leukopenia    Fever    Gram positive bacterial infection    Septic arthritis of right acromioclavicular joint ([place])  09/29/2017   Chronic left-sided low back pain with left-sided sciatica 09/19/2017   Epigastric pain    Essential hypertension 04/17/2017   Moderate persistent asthma 07/21/2015   Allergic rhinoconjunctivitis 07/21/2015   GERD (gastroesophageal reflux disease) 07/21/2015   Abnormal gait 11/12/2009   TARSAL TUNNEL SYNDROME, LEFT 10/08/2009   PES PLANUS  10/08/2009    History obtained from: chart review and patient.  [name] is a 47 y.o. male presenting for a follow up visit.  He was last seen in August 2023.  At that time, his lung testing looked a little bit lower.  We changed him from Symbicort to Trelegy 1 puff once daily.  For his rhinitis, it was under good control with Nasacort as well as Atrovent and cetirizine.  Since last visit, he has largely done well.  Asthma/Respiratory Symptom History: He remains on the Trelegy one puff once daily. This is affordable for him. He is having some insurance changes since it is the beginning of the year. The plan that he is on does not cover the Trelegy.  He did feel like the Symbicort was working better for him, but we wanted to make it easier for him last time which the Trelegy did provide.  He did not like the dry powder inhaler, but got used to it.  He does have a spacer to use when we changed back to [place].  Allergic Rhinitis Symptom History: He had to go to Urgent Care relatively recently for a sinus infection. This was one week ago. He tried to get things to clear up himself, but then the drainage increased.  He was placed on prednisone and amoxicillin and this is doing the trick. He is feeling better. Nose sprays in general are doing fairly well. He is doing generic Nasacort which seems to work.   We continues to work on healing.  His left foot has been slower to heal.  He has some longstanding pressure ulcers that are improving, albeit very slowly.  He is on both visibility now, but would like to work part-time at some point.  He does not want to just sit around and do nothing.  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  Otherwise, there have been no changes to his past medical history, surgical history, family history, or social history.    Review of Systems  Constitutional: Negative.  Negative for chills, fever, malaise/fatigue and weight loss.  HENT: Negative.   Negative for congestion, ear discharge, ear pain and sinus pain.   Eyes:  Negative for pain, discharge and redness.  Respiratory:  Negative for cough, sputum production, shortness of breath and wheezing.   Cardiovascular: Negative.  Negative for chest pain and palpitations.  Gastrointestinal:  Negative for abdominal pain, constipation, diarrhea, heartburn, nausea and vomiting.  Skin: Negative.  Negative for itching and rash.  Neurological:  Negative for dizziness and headaches.  Endo/Heme/Allergies:  Negative for environmental allergies. Does not bruise/bleed easily.       Objective:   Blood pressure 138/72, pulse 90, temperature 98 F (36.7 C), resp. rate 20, height 5\' 8"  (1.727 m), weight (!) 314 lb (142.4 kg), SpO2 97 %. Body mass index is 47.74 kg/m.    Physical Exam Vitals reviewed.  Constitutional:      Appearance: He is well-developed.     Comments: Sitting in a chair, although he came in using his wheelchair. Talkative. Comfortable appearing.   HENT:     Head: Normocephalic and atraumatic.  Right Ear: Tympanic membrane, ear canal and external ear normal.     Left Ear: Tympanic membrane, ear canal and external ear normal.     Nose: No nasal deformity, septal deviation, mucosal edema or rhinorrhea.     Right Turbinates: Enlarged, swollen and pale.     Left Turbinates: Enlarged, swollen and pale.     Right Sinus: No maxillary sinus tenderness or frontal sinus tenderness.     Left Sinus: No maxillary sinus tenderness or frontal sinus tenderness.     Mouth/Throat:     Mouth: Mucous membranes are not pale and not dry.     Pharynx: Uvula midline.  Eyes:     General: Lids are normal. Allergic shiner present.        Right eye: No discharge.        Left eye: No discharge.     Conjunctiva/sclera: Conjunctivae normal.     Right eye: Right conjunctiva is not injected. No chemosis.    Left eye: Left conjunctiva is not injected. No chemosis.    Pupils: Pupils are equal,  round, and reactive to light.  Cardiovascular:     Rate and Rhythm: Normal rate and regular rhythm.     Heart sounds: Normal heart sounds.  Pulmonary:     Effort: Pulmonary effort is normal. No tachypnea, accessory muscle usage or respiratory distress.     Breath sounds: Normal breath sounds. No wheezing, rhonchi or rales.     Comments: Moving air well in the upper fields, but not in the lower fields. Chest:     Chest wall: No tenderness.  Lymphadenopathy:     Cervical: No cervical adenopathy.  Skin:    General: Skin is warm.     Capillary Refill: Capillary refill takes less than 2 seconds.     Coloration: Skin is not pale.     Findings: Wound present. No abrasion, erythema, petechiae or rash. Rash is not papular, urticarial or vesicular.     Comments: No eczematous or urticarial lesions noted. He does have dressing in place on his feet secondary to pressure ulcers which are healing albeit slowly.   Neurological:     Mental Status: He is alert.  Psychiatric:        Behavior: Behavior is cooperative.      Diagnostic studies:    Spirometry: results abnormal (FEV1: 2.34/61%, FVC: 2.76/56%, FEV1/FVC: 85%).    Spirometry consistent with possible restrictive disease. Xopenex four puffs via MDI treatment given in clinic with no improvement.  Allergy Studies: none      Salvatore Marvel, MD  [facility]

## 2022-08-09 NOTE — Patient Instructions (Addendum)
1. Moderate persistent asthma, uncomplicated - Lung testing was slightly worse and did not change much with the albuterol treatment. - This might be related to your ongoing sinus infections.  - I think we need to consider doing an injectable asthma medication such as Berna Bue or Tezspire. - Information for both provided today. - They are both good medications to help the inhaler works better to control your breathing more effectively. - We will change from your Trelegy to [facility] since this is covered by your insurance this year.  - Daily controller medication(s):  Breztri two puffs twice daily with spacer - Prior to physical activity: albuterol 2 puffs 10-15 minutes before physical activity. - Rescue medications: albuterol 4 puffs every 4-6 hours as needed - Asthma control goals:  * Full participation in all desired activities (may need albuterol before activity) * Albuterol use two time or less a week on average (not counting use with activity) * Cough interfering with sleep two time or less a month * Oral steroids no more than once a year * No hospitalizations  2. Allergic rhinitis - well controlled - Continue with Nasacort one spray per nostril daily. - Continue with Atrovent nasal spray one spray per nostril every 8 hours as needed for runny nose.  - Continue with cetirizine 10mg  daily.  3. Return in about 2 months (around 10/08/2022).    Please inform us of any Emergency Department visits, hospitalizations, or changes in symptoms. Call us before going to the ED for breathing or allergy symptoms since we might be able to fit you in for a sick visit. Feel free to contact us anytime with any questions, problems, or concerns.  It was a pleasure to see you and your family again today! Give [place] an update!   Websites that have reliable patient information: 1. American Academy of Asthma, Allergy, and Immunology: www.aaaai.org 2. Food Allergy Research and Education (FARE): foodallergy.org 3.  Mothers of Asthmatics: http://www.asthmacommunitynetwork.org 4. American College of Allergy, Asthma, and Immunology: www.acaai.org   COVID-19 Vaccine Information can be found at: ShippingScam.co.uk For questions related to vaccine distribution or appointments, please email [email or web address] or call (978)[phone].   We realize that you might be concerned about having an allergic reaction to the COVID19 vaccines. To help with that concern, WE ARE OFFERING THE COVID19 VACCINES IN OUR OFFICE! Ask the front desk for dates!     "Like" [place] on Facebook and Instagram for our latest updates!      A healthy democracy works best when [employer] participate! Make sure you are registered to vote! If you have moved or changed any of your contact information, you will need to get this updated before voting!  In some cases, you MAY be able to register to vote online: [email or web address]

## 2022-08-15 ENCOUNTER — Telehealth: Payer: Self-pay | Admitting: *Deleted

## 2022-08-15 ENCOUNTER — Encounter (HOSPITAL_BASED_OUTPATIENT_CLINIC_OR_DEPARTMENT_OTHER): Payer: PPO | Attending: General Surgery | Admitting: General Surgery

## 2022-08-15 DIAGNOSIS — J45909 Unspecified asthma, uncomplicated: Secondary | ICD-10-CM | POA: Diagnosis not present

## 2022-08-15 DIAGNOSIS — M2170 Unequal limb length (acquired), unspecified site: Secondary | ICD-10-CM | POA: Diagnosis not present

## 2022-08-15 DIAGNOSIS — L97522 Non-pressure chronic ulcer of other part of left foot with fat layer exposed: Secondary | ICD-10-CM | POA: Diagnosis not present

## 2022-08-15 DIAGNOSIS — G6281 Critical illness polyneuropathy: Secondary | ICD-10-CM | POA: Diagnosis not present

## 2022-08-15 DIAGNOSIS — I10 Essential (primary) hypertension: Secondary | ICD-10-CM | POA: Insufficient documentation

## 2022-08-15 DIAGNOSIS — L89623 Pressure ulcer of left heel, stage 3: Secondary | ICD-10-CM | POA: Diagnosis not present

## 2022-08-15 DIAGNOSIS — Z8616 Personal history of COVID-19: Secondary | ICD-10-CM | POA: Diagnosis not present

## 2022-08-15 NOTE — Telephone Encounter (Signed)
Called patient and he wants to hold off on biologic for his asthma at this time. If he decides he wants to proceed he can reach out and we can proceed with PAP process

## 2022-08-15 NOTE — Telephone Encounter (Signed)
-----   Message from Valentina Shaggy, MD sent at 08/09/2022  1:36 PM EST ----- Cindee Salt (gave handouts for both)

## 2022-08-16 NOTE — Telephone Encounter (Signed)
Sounds like a plan!   Patria Warzecha, MD [facility]  

## 2022-08-16 NOTE — Progress Notes (Signed)
ZAVEN, KLEMENS ([id number]) [id number] Page 1 of 15 Visit Report for 08/15/2022 Chief Complaint Document Details Patient Name: Date of Service: [name], TO [name] 08/15/2022 9:30 A M Medical Record Number: [id number] Patient Account Number: [id number] Date of Birth/Sex: Treating RN: 1975/09/13 (47 y.o. M) Primary Care Provider: Dorinda Hill Other Clinician: Referring Provider: Treating Provider/Extender: Jana Hakim in Treatment: 108 Information Obtained from: Patient Chief Complaint Bilateral Plantar Foot Ulcers Electronic Signature(s) Signed: 08/15/2022 10:11:48 AM By: [name] MD FACS Entered By: [name] on 08/15/2022 10:11:48 -------------------------------------------------------------------------------- Debridement Details Patient Name: Date of Service: [name], [name] 08/15/2022 9:30 A M Medical Record Number: [id number] Patient Account Number: [id number] Date of Birth/Sex: Treating RN: January 21, 1976 (46 y.o. [name] Primary Care Provider: Dorinda Hill Other Clinician: Referring Provider: Treating Provider/Extender: Jana Hakim in Treatment: 108 Debridement Performed for Assessment: Wound #2 Left Calcaneus Performed By: Physician [name], MD Debridement Type: Debridement Level of Consciousness (Pre-procedure): Awake and Alert Pre-procedure Verification/Time Out Yes - 09:59 Taken: Start Time: 09:59 Pain Control: Lidocaine 4% T opical Solution T Area Debrided (L x W): otal 0.6 (cm) x 0.3 (cm) = 0.18 (cm) Tissue and other material debrided: Non-Viable, Eschar, Slough, Slough Level: Non-Viable Tissue Debridement Description: Selective/Open Wound Instrument: Curette Bleeding: Minimum Hemostasis Achieved: Pressure End Time: 10:00 Procedural Pain: 0 Post Procedural Pain: 0 Response to Treatment: Procedure was tolerated well Level of Consciousness (Post- Awake and  Alert procedure): Post Debridement Measurements of Total Wound Length: (cm) 0.6 Stage: Category/Stage III Width: (cm) 0.3 Depth: (cm) 0.1 Volume: (cm) 0.014 Character of Wound/Ulcer Post Debridement: Improved Post Procedure Diagnosis HAYES, CZAJA ([id number]) [id number] Page 2 of 15 Same as Pre-procedure Notes Scribed for Dr. Lady Gary by J.Scotton Electronic Signature(s) Signed: 08/15/2022 11:45:20 AM By: [name] MD FACS Signed: 08/15/2022 5:05:50 PM By: [name] RN Entered By: [name] on 08/15/2022 10:09:20 -------------------------------------------------------------------------------- HPI Details Patient Name: Date of Service: [name], [name] 08/15/2022 9:30 A M Medical Record Number: [id number] Patient Account Number: [id number] Date of Birth/Sex: Treating RN: 03/31/1976 (46 y.o. M) Primary Care Provider: Dorinda Hill Other Clinician: Referring Provider: Treating Provider/Extender: Jana Hakim in Treatment: 108 History of Present Illness HPI Description: Wounds are12/03/2020 upon evaluation today patient presents for initial inspection here in our clinic concerning issues he has been having with the bottoms of his feet bilaterally. He states these actually occurred as wounds when he was hospitalized for 5 months secondary to Covid. He was apparently with tilting bed where he was in an upright position quite frequently and apparently this occurred in some way shape or form during that time. Fortunately there is no sign of active infection at this time. No fevers, chills, nausea, vomiting, or diarrhea. With that being said he still has substantial wounds on the plantar aspects of his feet Theragen require quite a bit of work to get these to heal. He has been using Santyl currently though that is been problematic both in receiving the medication as well as actually paid for it as it is become quite expensive.  Prior to the experience with Covid the patient really did not have any major medical problems other than hypertension he does have some mild generalized weakness following the Covid experience. 07/22/2020 on evaluation today patient appears to be doing okay in regard to his foot ulcers I feel like the wound beds are showing signs of better improvement that I do believe the  Iodoflex is helping in this regard. With that being said he does have a lot of drainage currently and this is somewhat blue/green in nature which is consistent with Pseudomonas. I do think a culture today would be appropriate for us to evaluate and see if that is indeed the case I would likely start him on antibiotic orally as well he is not allergic to Cipro knows of no issues he has had in the past 12/21; patient was admitted to the clinic earlier this month with bilateral presumed pressure ulcers on the bottom of his feet apparently related to excessive pressure from a tilt table arrangement in the intensive care unit. Patient relates this to being on ECMO but I am not really sure that is exactly related to that. I must say I have never seen anything like this. He has fairly extensive full-thickness wounds extending from his heel towards his midfoot mostly centered laterally. There is already been some healing distally. He does not appear to have an arterial issue. He has been using gentamicin to the wound surfaces with Iodoflex to help with ongoing debridement 1/6; this is a patient with pressure ulcers on the bottom of his feet related to excessive pressure from a standing position in the intensive care unit. He is complaining of a lot of pain in the right heel. He is not a diabetic. 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 We have been using Iodoflex to help prepare the surfaces of both wounds for an advanced treatment product. He is nonambulatory spending most of his time in a wheelchair I have asked  him not to propel the wheelchair with his heels 1/13; in general his wounds look better not much surface area change we have been using Iodoflex as of last week. I did an x-ray of the right heel as the patient was complaining of pain. I had some thoughts about a stress fracture perhaps Achilles tendon problems however what it showed was erosive changes along the inferior aspect of the calcaneus he now has a MRI booked for 1/20. 1/20; in general his wounds continue to be better. Some improvement in the large narrow areas proximally in his foot. He is still complaining of pain in the right heel and tenderness in certain areas of this wound. His MRI is tonight. I am not just looking for osteomyelitis that was brought up on the x-ray I am wondering about stress fractures, tendon ruptures etc. He has no such findings on the left. Also noteworthy is that the patient had critical illness neuropathy and some of the discomfort may be actual improvement in nerve function I am just not sure. These wounds were initially in the setting of severe critical illness related to COVID-19. He was put in a standing position. He may have also been on pressors at the point contributing to tissue ischemia. By his description at some point these wounds were grossly necrotic extending proximally up into the Achilles part of his heel. I do not know that I have ever really seen pictures of them like this although they may exist in epic We have ordered Tri layer Oasis. I am trying to stimulate some granulation in these areas. This is of course assuming the MRI is negative for infection 1/27; since the patient was last here he saw Dr. Earlene PlaterWallace of infectious disease. He is planned for vancomycin and ceftriaxone. Prior operative culture grew MSSA. Also ordered baseline lab work. He also ordered arterial studies although the ABIs in  our clinic were normal as well as his clinical exam these were normal I do not think he needs to  see vascular surgery. His ABIs at the [facility] were 1.22 in the right triphasic waveforms with a normal TBI of 1.15 on the left ABI of 1.22 with triphasic waveforms and a normal TBI of 1.08. Finally he saw Dr. Amalia Hailey who will follow him in for 2 months. At this point I do not think he felt that he needed a procedure on the right calcaneal bone. Dr. Juleen China is elected for broad-spectrum antibiotic The patient is still having pain in the right heel. He walks with a walker 2/3; wounds are generally smaller. He is tolerating his IV antibiotics. I believe this is vancomycin and ceftriaxone. We are still waiting for Oasis burn in terms of his out-of-pocket max which he should be meeting soon given the IV antibiotics, MRIs etc. I have asked him to check in on this. We are using silver collagen in the meantime the wounds look better 2/10; tolerating IV vancomycin and Rocephin. We are waiting to apply for Oasis. Although I am not really sure where he is in his out-of-pocket max. 2/17 started the first application of Oasis trilayer. Still on antibiotics. The wounds have generally look better. The area on the left has a little more surface CAVAN, BEARDEN ([id number]) [id number] Page 3 of 15 slough requiring debridement 3/66; second application of Oasis trilayer. The wound surface granulation is generally look better. The area on the left with undermining laterally I think is come in a bit. 10/08/2020 upon evaluation today patient is here today for [employer] application #3. Fortunately he seems to be doing extremely well with regard to this and we are seeing a lot of new epithelial growth which is great news. Fortunately there is no signs of active infection at this time. 10/16/2020 upon evaluation today patient appears to be doing well with regard to his foot ulcers. Do believe the Oasis has been of benefit for him. I do not see any signs of infection right now which is great news and  I think that he has a lot of new epithelial growth which is great to see as well. The patient is very pleased to hear all of this. I do think we can proceed with the Oasis trilayer #4 today. 3/18; not as much improvement in these areas on his heels that I was hoping. I did reapply trilateral Oasis today the tissue looks healthier but not as much fill in as I was hoping. 3/25; better looking today I think this is come in a bit the tissue looks healthier. Triple layer Oasis reapplied #6 4/1; somewhat better looking definitely better looking surface not as much change in surface area as I was hoping. He may be spending more time [place] on days then he needs to although he does have heel offloading boots. Triple layer Oasis reapplied #7 4/7; unfortunately apparently [facility] will not approve any further Oasis which is unfortunate since the patient did respond nicely both in terms of the condition of the wound bed as well as surface area. There is still some drainage coming from the wound but not a lot there does not appear to be any infection 4/15; we have been using Hydrofera Blue. He continues to have nice rims of epithelialization on the right greater than the left. The left the epithelialization is coming from the tip of his heel. There is moderate drainage. In this that  concerns me about a total contact cast. There is no evidence of infection 4/29; patient has been using Hydrofera Blue with dressing changes. He has no complaints or issues today. 5/5; using Hydrofera Blue. I actually think that he looks marginally better than the last time I saw this 3 weeks ago. There are rims of epithelialization on the left thumb coming from the medial side on the right. Using Hydrofera Blue 5/12; using Hydrofera Blue. These continue to make improvements in surface area. His drainage was not listed as severe I therefore went ahead and put a cast on the left foot. Right foot we will continue to dress  his previous 5/16; back for first total contact cast change. He did not tolerate this particularly well cast injury on the anterior tibia among other issues. Difficulty sleeping. I talked him about this in some detail and afterwards is elected to continue. I told him I would like to have a cast on for 3 weeks to see if this is going to help at all. I think he agreed 5/19; I think the wound is better. There is no tunneling towards his midfoot. The undermining medially also looks better. He has a rim of new skin distally. I think we are making progress here. The area on the left also continues to look somewhat better to me using Hydrofera Blue. He has a list of complaints about the cast but none of them seem serious 5/26; patient presents for 1 week follow-up. He has been using a total contact cast and tolerating this well. Hydrofera Blue is the main dressing used. He denies signs of infection. 6/2 Hydrofera Blue total contact cast on the left. These were large ulcers that formed in intensive care unit where the patient was recovering from COVID. May have had something to do with being ventilated in an upright positiono Pressors etc. We have been able to get the areas down considerably and a viable surface. There is some epithelialization in both sides. Note made of drainage 6/9; changed to [facility] last time because of drainage. He arrives with better looking surfaces and dimensions on the left than the right. Paradoxically the right actually probes more towards his midfoot the left is largely close down but both of these look improved. Using a total contact cast on the left 6/16; complex wounds on his bilateral plantar heels which were initially pressure injury from a stay in the ICU with COVID. We have been using silver alginate most recently. His dimensions of come in quite dramatically however not recently. We have been putting the left foot in a total contact cast 6/23; complex wounds on  the bilateral plantar heels. I been putting the left in the cast paradoxically the area on the right is the one that is going towards closure at a faster rate. Quite a bit of drainage on the left. The patient went to see Dr. Logan Bores who said he was going to standby for skin grafts. I had actually considered sending him for skin grafts however he would be mandatorily off his feet for a period of weeks to months. I am thinking that the area on the right is going to close on its own the area on the left has been more stubborn even though we have him in a total contact cast 6/30; took him out of a total contact cast last week is the right heel seem to be making better progress than the left where I was placing the cast. We are using  silver alginate. Both wounds are smaller right greater than left 7/12; both wounds look as though they are making some progress. We are using silver alginate. Heel offloading boots 7/26; very gradual progress especially on the right. Using silver alginate. He is wearing heel offloading boots 8/18; he continues to close these wounds down very gradually. Using silver alginate. The problem polymen being definitive about this is areas of what appears to be callus around the margins. This is not a 100% of the area but certainly sizable especially on the right 9/1; bilateral plantar feet wounds secondary to prolonged pressure while being ventilated for COVID-19 in an upright position. Essentially pressure ulcers on the bottom of his feet. He is made substantial progress using silver alginate. 9/14; bilateral plantar feet wounds secondary to prolonged pressure. Making progress using silver alginate. 9/29 bilateral plantar feet wounds secondary to prolonged pressure. I changed him to Iodoflex last week. MolecuLight showing reddened blush fluorescence 10/11; patient presents for follow-up. He has no issues or complaints today. He denies signs of infection. He continues to use Iodoflex  and antibiotic ointment to the wound beds. 10/27; 2-week follow-up. No evidence of infection. He has callus and thick dry skin around the wound margins we have been using Iodoflex and Bactroban which was in response to a moderate left MolecuLight reddish blush fluorescence. 11/10; 2-week follow-up. Wound margins again have thick callus however the measurements of the actual wound sites are a lot smaller. Everything looks reasonably healthy here. We have been using Iodoflex He was approved for prime matrix but I have elected to delay this given the improvement in the surface area. Hopefully I will not regret that decision as were getting close to the end of the year in terms of insurance payment 12/8; 2-week follow-up. Wounds are generally smaller in size. These were initially substantial wounds extending into the forefoot all the way into the heel on the bilateral plantar feet. They are now both located on the plantar heel distal aspect both of these have a lot of callus around the wounds I used a #5 curette to remove this on the right and the left also some subcutaneous debris to try and get the wound edges were using Iodoflex. He has heel offloading shoe 12/22; 2-week follow-up. Not really much improvement. He has thick callus around the outer edges of both wounds. I remove this there is some nonviable subcutaneous tissue as well. We have been using Iodoflex. Her intake nurse and myself spontaneously thought of a total contact cast I went back in May. At that time we really were not seeing much of an improvement with a cast although the wound was in a much different situation I would like to retry this in 2 weeks and I discussed this with the patient 08/12/2021; the patient has had some improvement with the Iodoflex. The the area on the left heel plantar more improved than the right. I had to put him in a total contact cast on the left although I decided to put that off for 2 weeks. I am going to  change his primary dressing to silver collagen. I think in both areas he has had some improvement most of the healing seems to be more proximal in the heel. The wounds are in the mid aspect. A lot of thick callus on the right heel however. 1/19; we are using silver collagen on both plantar heel areas. He has had some improvement today. The left did not require any debridement. He still  had some eschar on the right that was debrided but both seem to have contracted. I did not put it total contact cast on him today 2/2 we have been using silver collagen. The area on the right plantar heel has areas that appear to be epithelialized interspersed with dry flaking callus and dry skin. I removed this. This really looks better than on the other side. On the left still a large area with raised edges and debris on the surface. 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 He has no issues or complaints today. 2/9; not much change in the left foot wound with 1 week of a cast we are using silver collagen. Silver collagen on the right side. The right side has been the better wound surface. We will reapply the total contact cast on the left 2/16; not much improvement on either side I been using silver collagen with a total contact cast on the left. I'm changing the [facility] still with a total contact cast on the left CHAYSON, CHARTERS ([id number]) [id number] Page 4 of 15 2/23; some improvement on both sides. Disappointing that he has thick callus around the area that we are putting in a total contact cast on the left. We've been using Hydrofera Blue on both wound areas. This is a man who at essentially pressure ulcers in addition to ischemia caused by medications to support his blood pressure (pressors) in the ICU. He was being ventilated in the standing  position for severe Covid. A Shiley the wounds extended across his entire foot but are now localized to his plantar heels bilaterally. We have made progress however neither areas healed. I continue to think the total contact cast is helped albeit painstakingly slowly. He has never wanted a plastic surgery consult although I don't know that they would be interested in grafting in area in this location. 10/07/2021: Continued improvement bilaterally. There is still some callus around the left wound, despite the total contact cast. He has some increased pain in his right midfoot around 1 particular area. This has been painful in the past but seems to be a little bit worse. When his cast was removed today, there was an area on the heel of the left foot that looks a bit macerated. He is also complaining of pain in his left thigh and hip which he thinks is secondary to the limb length discrepancy caused by the cast. 10/14/2021: He continues to improve. A little bit less callus around the left wound. He continues to endorse pain in his right midfoot, but this is not as significant as it was last week. The maceration on his left heel is improved. 10/21/2021: Continued improvement to both wounds. The maceration on his left heel is no longer evident. Less callus bilaterally. Epithelialization progressing. 10/28/2021: Significant improvement this week. The right sided wound is nearly closed with just a small open area at the middle. No maceration seen on the left heel. Continued epithelialization on both sides. No concern for infection. 11/04/2021: T oday, the wounds were measured a little bit differently and come out as larger, but I actually think they are about the same to potentially even smaller, particularly on the left. He continues to accumulate some callus on the right. 11/11/2021: T oday, the patient is expressing some concern that the left wound, despite being in the total contact cast,  is not progressing at the  same rate as the 1 on the right. He is interested in trying a week without the cast to see how the wound does. The wounds are roughly the same size as last week, with the right perhaps being a little bit smaller. He continues to build up callus on both sites. 11/18/2021: Last week, I permitted the patient to go without his total contact cast, just to see if the cast was really making any difference. Today, both wounds have deteriorated to some extent, suggesting that the cast is providing benefit, at least on the left. Both are larger and have accumulated callus, slough, and other debris. 11/26/2021: I debrided both wounds quite aggressively last week in an effort to stimulate the healing cascade. This appears to have been effective as the left sided wound is a full centimeter shorter in length. Although the right was measured slightly larger, on inspection, it looks as though an area of epithelialized tissue was included in the measurements. We have been using PolyMem Ag on the wound surfaces with a total contact cast to the left. 12/02/2021: It appears that the intake personnel are including epithelialized tissue in his wound measurements; the right wound is almost completely epithelialized; there is just a crater at the proximal midfoot with some open areas. On the left, he has built up some callus, but the overall wound surface looks good. There is some senescent skin around the wound margin. He has been in PolyMem Ag bilaterally with a total contact cast on the left. 12/09/2021: The right wound is nearly closed; there is just a small open area at the mid calcaneus. On the left, the wound is smaller with minimal callus buildup. No significant drainage. 12/16/2021: The right calcaneal wound remains minimally open at the mid calcaneus; the rest has epithelialized. On the left, the wound is also a little bit smaller. There is some senescent tissue on the periphery. He is getting his first application of a  trial skin substitute called Vendaje today. 12/23/2021: The wound on his right calcaneus is nearly closed; there is just a small area at the most distal aspect of the calcaneus that is open. On the left, the area where we applied to the skin substitute has a healthier-looking bed of granulation tissue. The wound dimensions are not significantly different on this side but the wound surface is improved. 12/30/2021: The wound on the right calcaneus has not changed significantly aside from some accumulation of callus. On the left, the open area is smaller and continues to have an improved surface. He continues to accumulate callus around the wound. He is here for his third application of Vendaje. 01/06/2022: The right calcaneal wound is down to just a couple of millimeters. He continues to accumulate periwound callus. He unfortunately got his cast wet earlier in the week and his left foot is macerated, resulting in some superficial skin loss just distal to the open wound. The open wound itself, however, is much smaller and has a healthier appearing surface. He is here for his fourth application of Vendaje. 01/13/2022: The right calcaneal wound is about the same. Unfortunately, once again, his cast got wet and his foot is again macerated. This is caused the left calcaneal wound to enlarge. He is here for his fifth application of Vendaje. 01/20/2022: The right calcaneal wound is very small. There is some periwound callus accumulation. He purchased a new cast protector last week and this has been effective in avoiding the maceration that  has been occurring on the left. The left calcaneal wound is narrower and has a healthy and viable-appearing surface. He is here for his 6 application of Vendaje. 01/27/2022: The right calcaneal wound is down to just a pinhole. There is some periwound slough and callus. On the left, the wound is narrower and shorter by about a centimeter. The surface is robust and viable-appearing.  Unfortunately, the rep for the trial skin substitute product did not provide any for [place] to use today. 02/04/2022: The right calcaneal wound remains unchanged. There is more accumulated callus. On the left, although the intake nurse measured it a little bit longer, it looks about the same to me. The surface has a layer of slough, but underneath this, there is good granulation tissue. 02/10/2022: The right calcaneus wound is nearly closed. There is still some callus that builds up around the site. The left side looks about the same in terms of dimensions, but the surface is more robust and vital-appearing. 02/16/2022: The area of the right calcaneus that was nearly closed last week has closed, but there is a small opening at the mid foot where it looks like some moisture got retained and caused some reopening. The left foot wound is narrower and shallower. Both sites have a fair amount of periwound callus and eschar. 02/24/2022: The small midfoot opening on the right calcaneus is a little bit smaller today. The left foot wound is narrower and shallower. He continues to accumulate periwound callus. No concern for infection. 03/01/2022: The patient came to clinic early because he showered and got his cast wet. Fortunately, there is no significant maceration to his foot but the callus softened and it looks like the wound on his left calcaneus may be a little bit wider. The wound on his right calcaneus is just a narrow slit. Continued accumulation of periwound callus bilaterally. 03/08/2022: The wound on his right calcaneus is very nearly closed, just a small pinpoint opening under a bit of eschar; the left wound has come in quite a bit, as well. It is narrower and shorter than at our last visit. Still with accumulated callus and eschar bilaterally. 03/17/2022: The right calcaneal wound is healed. The left wound is smaller and the surface itself is very clean, but there is some blue-green staining on the periwound  callus, concerning for Pseudomonas aeruginosa. 03/23/2022: The right calcaneal wound remains closed. The left wound continues to contract. No further blue-green staining. Small amount of callus and slough accumulation. LINKIN, VIZZINI ([id number]) [id number] Page 5 of 15 03/28/2022: He came in early today because he had gotten his cast wet. On inspection, the wound itself did not get wet or macerated, just a little bit of the forefoot. The wound itself is basically unchanged. 04/07/2022: The right foot wound remains closed. The left wound is the smallest that I have seen it to date. It is narrower and shorter. It still continues to accumulate slough on the surface. 04/15/2022: There is a band of epithelium now dividing the small left plantar foot wound in 2. There is still some slough on the surface. 04/21/2022: The wound continues to narrow. Just a little bit of slough on the surface. He seems to be responding well to endoform. 04/28/2022: Continued slow contraction of the wound. There is a little slough on the surface and some periwound callus. We have been using endoform and total contact cast. 05/05/2022: The wound appears to have stalled. There is slough and some periwound eschar/callus. No concern for infection,  however. 05/12/2022: Unfortunately, his right foot has reopened. It is located at the most posterior aspect of his surgical incision. The area was noted to have drainage coming from it when his padding was removed today. Underneath some callus and senescent skin, there is an opening. No purulent drainage or malodor. On the left foot, the wound is again unchanged. There is some light blue staining on the callus, but no malodor or purulent drainage. 10/13; right and left heel remanence of extensive plantar foot wounds. These are better than I remember by quite a big margin however he is still left with wounds on the left plantar heel and the right plantar heel. Been  using endoform bilaterally. A culture was done that showed apparently Pseudomonas but we are still waiting for the [facility] antibiotic to use gentamicin today. He is still very active by description I am not sure about the offloading of his noncasted right foot 10/20; both wounds right and left heel debrided not much change from last week. [name] Echevaria has arrived which is linezolid, gentamicin and ciprofloxacin we will use this with endoform. T contact cast on the left otal 06/02/2022: Both wounds are smaller today. There is still a fair amount of callus buildup around the right foot ulcer. The left is more superficial and nearly flush with the surrounding tissues. Also with slough and eschar buildup. 06/10/2022: The right sided wound appears to be nearly closed, if not completely so, although it is somewhat difficult to tell given the abnormal tissue and scarring in his foot. There is a fair amount of callus and crust accumulation. On the left, the wound looks about the same, again with callus and slough. He has an appointment next Thursday with Dr. Annamary Rummage in podiatry; I am hopeful that there may be some reconstructive options available for Mr. Recine. 06/16/2022: Both wounds have some eschar and callus accumulation. The right sided wound is extremely narrow and barely open; the left is narrower than last week. There is a little bit of slough. He has his appointment in podiatry later today. 06/23/2022: The patient met with Dr. Annamary Rummage last week and unfortunately, there are no reconstructive options that he believes would be helpful. He did order an MRI to evaluate for osteomyelitis and fortunately, none was seen. The left sided wound is a little bit shorter and narrower today. The right sided wound is about the same. There is callus and eschar accumulation bilaterally. 06/29/2022: Both feet have improved from last week. There is epithelium making a valiant effort to creep across the surface on  the left. The right side looks like it got a little dry and the deep crevasse in his midfoot has cracked. Both have eschar and there is some slough on the left. 07/07/2022: Both feet have improved. There is epithelium completely covering the calcaneus on the right with just a small opening in the crevasse in his midfoot. On the left, the open area of tissue is smaller but he continues to build up callus/eschar and slough. 07/15/2022: The opening in the midfoot on the right is about the same size, covered with eschar and a little bit of slough. The open portion of the left wound is narrower and shorter with a bit of slough buildup. He admits to being on his feet more than recommended. 12/14; as far as I can tell everything on the right foot is closed. There is some eschar I removed some of this I cannot identify any open wound here. As usual this will be  a very vulnerable area going forward. On the left this looks really quite healthy. I was pleasantly surprised to see how good this looked. Wound is certainly smaller and there appears to be healthy epithelialization. He has been using Promogran on the right and endoform on the left. He has been offloading the right foot with a heel offloading boot and he has a running shoe on the right foot 08/04/2022: The right foot remains closed. He has a thick cushioned insole in his sneaker. The left sided wound is smaller with just some slough and eschar accumulation. He is wearing the heel off loader on this foot. 08/15/2022: The right foot remains closed. The left wound has narrowed further. There is some slough and eschar accumulation. Electronic Signature(s) Signed: 08/15/2022 10:12:35 AM By: [name] MD FACS Entered By: [name] on 08/15/2022 10:12:35 -------------------------------------------------------------------------------- Physical Exam Details Patient Name: Date of Service: [name], [name] 08/15/2022 9:30 A M Medical Record Number:  [id number] Patient Account Number: [id number] Date of Birth/Sex: Treating RN: [birth year] (47 y.o. M) Primary Care Provider: Dorinda Hill Other Clinician: Referring Provider: Treating Provider/Extender: Jana Hakim in Treatment: 108 Constitutional no acute distress. KEN, BONN ([id number]) [id number] Page 6 of 15 Respiratory Normal work of breathing on room air. Notes 08/15/2022: The right foot remains closed. The left wound has narrowed further. There is some slough and eschar accumulation. Electronic Signature(s) Signed: 08/15/2022 10:13:08 AM By: [name] MD FACS Entered By: [name] on 08/15/2022 10:13:08 -------------------------------------------------------------------------------- Physician Orders Details Patient Name: Date of Service: [name], [name] 08/15/2022 9:30 A M Medical Record Number: [id number] Patient Account Number: [id number] Date of Birth/Sex: Treating RN: [birth year] (47 y.o. [name] Primary Care Provider: Dorinda Hill Other Clinician: Referring Provider: Treating Provider/Extender: Jana Hakim in Treatment: [phone] Verbal / Phone Orders: No Diagnosis Coding ICD-10 Coding Code Description [phone] Non-pressure chronic ulcer of other part of left foot with fat layer exposed Follow-up Appointments ppointment in 1 week. - Dr. Lady Gary Room 3 Return A Anesthetic Wound #2 Left Calcaneus (In clinic) Topical Lidocaine 4% applied to wound bed - In clinic Edema Control - Lymphedema / SCD / Other Bilateral Lower Extremities Avoid standing for long periods of time. Moisturize legs daily. - as needed Off-Loading Other: - keep pressure off of the bottom of your feet. Globoped to left foot-when not wearing the cast Elevate legs throughout the day. Use the Shoe with the PegAssist off-loadinding insole Additional Orders / Instructions Follow Nutritious Diet Wound  Treatment Wound #2 - Calcaneus Wound Laterality: Left Cleanser: Normal Saline (Generic) Every Other Day/30 Days Discharge Instructions: Cleanse the wound with Normal Saline prior to applying a clean dressing using gauze sponges, not tissue or cotton balls. Cleanser: Wound Cleanser (Generic) Every Other Day/30 Days Discharge Instructions: Cleanse the wound with wound cleanser prior to applying a clean dressing using gauze sponges, not tissue or cotton balls. Prim Dressing: Endoform 2x2 in (Generic) Every Other Day/30 Days ary Discharge Instructions: Moisten with saline or Hydrogel Secondary Dressing: Optifoam Non-Adhesive Dressing, 4x4 in (Generic) Every Other Day/30 Days Discharge Instructions: Apply over primary dressing as directed. Secondary Dressing: Woven Gauze Sponge, Non-Sterile 4x4 in (Generic) Every Other Day/30 Days Discharge Instructions: Apply over primary dressing as directed. Secured With: 75M Medipore H Soft Cloth Surgical T ape, 4 x 10 (in/yd) (Generic) Every Other Day/30 Days Discharge Instructions: Secure with tape as directed. KAMEL, HAVEN ([id number]) [id number] Page 7 of 15 Electronic Signature(s)  Signed: 08/15/2022 11:45:20 AM By: [name] MD FACS Entered By: [name] on 08/15/2022 10:13:24 -------------------------------------------------------------------------------- Problem List Details Patient Name: Date of Service: [name] 08/15/2022 9:30 A M Medical Record Number: [id number] Patient Account Number: [id number] Date of Birth/Sex: Treating RN: 26-Nov-1975 (47 y.o. M) Primary Care Provider: Dorinda Hill Other Clinician: Referring Provider: Treating Provider/Extender: Jana Hakim in Treatment: 108 Active Problems ICD-10 Encounter Code Description Active Date MDM Diagnosis L97.522 Non-pressure chronic ulcer of other part of left foot with fat layer exposed 09/03/2020 No Yes Inactive  Problems ICD-10 Code Description Active Date Inactive Date L89.893 Pressure ulcer of other site, stage 3 07/15/2020 07/15/2020 M62.81 Muscle weakness (generalized) 07/15/2020 07/15/2020 I10 Essential (primary) hypertension 07/15/2020 07/15/2020 M86.171 Other acute osteomyelitis, right ankle and foot 09/03/2020 09/03/2020 L97.512 Non-pressure chronic ulcer of other part of right foot with fat layer exposed 09/03/2020 09/03/2020 Resolved Problems Electronic Signature(s) Signed: 08/15/2022 10:11:00 AM By: [name] MD FACS Entered By: [name] on 08/15/2022 10:11:00 -------------------------------------------------------------------------------- Progress Note Details Patient Name: Date of Service: [name], [name] 08/15/2022 9:30 A M Medical Record Number: [id number] Patient Account Number: [id number] Date of Birth/Sex: Treating RN: Jul 16, 1976 (47 y.o. M) Primary Care Provider: Dorinda Hill Other Clinician: Referring Provider: Treating Provider/Extender: Dewaine, Morocho ([id number]) [id number] Page 8 of 15 Weeks in Treatment: 108 Subjective Chief Complaint Information obtained from Patient Bilateral Plantar Foot Ulcers History of Present Illness (HPI) Wounds are12/03/2020 upon evaluation today patient presents for initial inspection here in our clinic concerning issues he has been having with the bottoms of his feet bilaterally. He states these actually occurred as wounds when he was hospitalized for 5 months secondary to Covid. He was apparently with tilting bed where he was in an upright position quite frequently and apparently this occurred in some way shape or form during that time. Fortunately there is no sign of active infection at this time. No fevers, chills, nausea, vomiting, or diarrhea. With that being said he still has substantial wounds on the plantar aspects of his feet Theragen require quite a bit of work to get  these to heal. He has been using Santyl currently though that is been problematic both in receiving the medication as well as actually paid for it as it is become quite expensive. Prior to the experience with Covid the patient really did not have any major medical problems other than hypertension he does have some mild generalized weakness following the Covid experience. 07/22/2020 on evaluation today patient appears to be doing okay in regard to his foot ulcers I feel like the wound beds are showing signs of better improvement that I do believe the Iodoflex is helping in this regard. With that being said he does have a lot of drainage currently and this is somewhat blue/green in nature which is consistent with Pseudomonas. I do think a culture today would be appropriate for [place] to evaluate and see if that is indeed the case I would likely start him on antibiotic orally as well he is not allergic to Cipro knows of no issues he has had in the past 12/21; patient was admitted to the clinic earlier this month with bilateral presumed pressure ulcers on the bottom of his feet apparently related to excessive pressure from a tilt table arrangement in the intensive care unit. Patient relates this to being on ECMO but I am not really sure that is exactly related to that. I must say  I have never seen anything like this. He has fairly extensive full-thickness wounds extending from his heel towards his midfoot mostly centered laterally. There is already been some healing distally. He does not appear to have an arterial issue. He has been using gentamicin to the wound surfaces with Iodoflex to help with ongoing debridement 1/6; this is a patient with pressure ulcers on the bottom of his feet related to excessive pressure from a standing position in the intensive care unit. He is complaining of a lot of pain in the right heel. He is not a diabetic. 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 We  have been using Iodoflex to help prepare the surfaces of both wounds for an advanced treatment product. He is nonambulatory spending most of his time in a wheelchair I have asked him not to propel the wheelchair with his heels 1/13; in general his wounds look better not much surface area change we have been using Iodoflex as of last week. I did an x-ray of the right heel as the patient was complaining of pain. I had some thoughts about a stress fracture perhaps Achilles tendon problems however what it showed was erosive changes along the inferior aspect of the calcaneus he now has a MRI booked for 1/20. 1/20; in general his wounds continue to be better. Some improvement in the large narrow areas proximally in his foot. He is still complaining of pain in the right heel and tenderness in certain areas of this wound. His MRI is tonight. I am not just looking for osteomyelitis that was brought up on the x-ray I am wondering about stress fractures, tendon ruptures etc. He has no such findings on the left. Also noteworthy is that the patient had critical illness neuropathy and some of the discomfort may be actual improvement in nerve function I am just not sure. These wounds were initially in the setting of severe critical illness related to COVID-19. He was put in a standing position. He may have also been on pressors at the point contributing to tissue ischemia. By his description at some point these wounds were grossly necrotic extending proximally up into the Achilles part of his heel. I do not know that I have ever really seen pictures of them like this although they may exist in epic We have ordered Tri layer Oasis. I am trying to stimulate some granulation in these areas. This is of course assuming the MRI is negative for infection 1/27; since the patient was last here he saw Dr. Earlene Plater of infectious disease. He is planned for vancomycin and ceftriaxone. Prior operative culture grew MSSA. Also  ordered baseline lab work. He also ordered arterial studies although the ABIs in our clinic were normal as well as his clinical exam these were normal I do not think he needs to see vascular surgery. His ABIs at the [facility] were 1.22 in the right triphasic waveforms with a normal TBI of 1.15 on the left ABI of 1.22 with triphasic waveforms and a normal TBI of 1.08. Finally he saw Dr. Logan Bores who will follow him in for 2 months. At this point I do not think he felt that he needed a procedure on the right calcaneal bone. Dr. Earlene Plater is elected for broad-spectrum antibiotic The patient is still having pain in the right heel. He walks with a walker 2/3; wounds are generally smaller. He is tolerating his IV antibiotics. I believe this is vancomycin and ceftriaxone. We are still waiting for Oasis  burn in terms of his out-of-pocket max which he should be meeting soon given the IV antibiotics, MRIs etc. I have asked him to check in on this. We are using silver collagen in the meantime the wounds look better 2/10; tolerating IV vancomycin and Rocephin. We are waiting to apply for Oasis. Although I am not really sure where he is in his out-of-pocket max. 2/17 started the first application of Oasis trilayer. Still on antibiotics. The wounds have generally look better. The area on the left has a little more surface slough requiring debridement 2/24; second application of Oasis trilayer. The wound surface granulation is generally look better. The area on the left with undermining laterally I think is come in a bit. 10/08/2020 upon evaluation today patient is here today for [employer] application #3. Fortunately he seems to be doing extremely well with regard to this and we are seeing a lot of new epithelial growth which is great news. Fortunately there is no signs of active infection at this time. 10/16/2020 upon evaluation today patient appears to be doing well with regard to his foot ulcers. Do believe the Oasis  has been of benefit for him. I do not see any signs of infection right now which is great news and I think that he has a lot of new epithelial growth which is great to see as well. The patient is very pleased to hear all of this. I do think we can proceed with the Oasis trilayer #4 today. 3/18; not as much improvement in these areas on his heels that I was hoping. I did reapply trilateral Oasis today the tissue looks healthier but not as much fill in as I was hoping. 3/25; better looking today I think this is come in a bit the tissue looks healthier. Triple layer Oasis reapplied #6 4/1; somewhat better looking definitely better looking surface not as much change in surface area as I was hoping. He may be spending more time [place] on days then he needs to although he does have heel offloading boots. Triple layer Oasis reapplied #7 4/7; unfortunately apparently [facility] will not approve any further Oasis which is unfortunate since the patient did respond nicely both in terms of the condition of the wound bed as well as surface area. There is still some drainage coming from the wound but not a lot there does not appear to be any infection 4/15; we have been using Hydrofera Blue. He continues to have nice rims of epithelialization on the right greater than the left. The left the epithelialization is coming from the tip of his heel. There is moderate drainage. In this that concerns me about a total contact cast. There is no evidence of infection 4/29; patient has been using Hydrofera Blue with dressing changes. He has no complaints or issues today. 5/5; using Hydrofera Blue. I actually think that he looks marginally better than the last time I saw this 3 weeks ago. There are rims of epithelialization on the left thumb coming from the medial side on the right. Using Hydrofera Blue 5/12; using Hydrofera Blue. These continue to make improvements in surface area. His drainage was not listed as  severe I therefore went ahead and put a cast on the left foot. Right foot we will continue to dress his previous 5/16; back for first total contact cast change. He did not tolerate this particularly well cast injury on the anterior tibia among other issues. Difficulty sleeping. I talked him about this  in some detail and afterwards is elected to continue. I told him I would like to have a cast on for 3 weeks to see if this is going to help at LANDEN, KNOEDLER ([id number]) [id number] Page 9 of 15 all. I think he agreed 5/19; I think the wound is better. There is no tunneling towards his midfoot. The undermining medially also looks better. He has a rim of new skin distally. I think we are making progress here. The area on the left also continues to look somewhat better to me using Hydrofera Blue. He has a list of complaints about the cast but none of them seem serious 5/26; patient presents for 1 week follow-up. He has been using a total contact cast and tolerating this well. Hydrofera Blue is the main dressing used. He denies signs of infection. 6/2 Hydrofera Blue total contact cast on the left. These were large ulcers that formed in intensive care unit where the patient was recovering from COVID. May have had something to do with being ventilated in an upright positiono Pressors etc. We have been able to get the areas down considerably and a viable surface. There is some epithelialization in both sides. Note made of drainage 6/9; changed to [facility] last time because of drainage. He arrives with better looking surfaces and dimensions on the left than the right. Paradoxically the right actually probes more towards his midfoot the left is largely close down but both of these look improved. Using a total contact cast on the left 6/16; complex wounds on his bilateral plantar heels which were initially pressure injury from a stay in the ICU with COVID. We have been using  silver alginate most recently. His dimensions of come in quite dramatically however not recently. We have been putting the left foot in a total contact cast 6/23; complex wounds on the bilateral plantar heels. I been putting the left in the cast paradoxically the area on the right is the one that is going towards closure at a faster rate. Quite a bit of drainage on the left. The patient went to see Dr. Logan Bores who said he was going to standby for skin grafts. I had actually considered sending him for skin grafts however he would be mandatorily off his feet for a period of weeks to months. I am thinking that the area on the right is going to close on its own the area on the left has been more stubborn even though we have him in a total contact cast 6/30; took him out of a total contact cast last week is the right heel seem to be making better progress than the left where I was placing the cast. We are using silver alginate. Both wounds are smaller right greater than left 7/12; both wounds look as though they are making some progress. We are using silver alginate. Heel offloading boots 7/26; very gradual progress especially on the right. Using silver alginate. He is wearing heel offloading boots 8/18; he continues to close these wounds down very gradually. Using silver alginate. The problem polymen being definitive about this is areas of what appears to be callus around the margins. This is not a 100% of the area but certainly sizable especially on the right 9/1; bilateral plantar feet wounds secondary to prolonged pressure while being ventilated for COVID-19 in an upright position. Essentially pressure ulcers on the bottom of his feet. He is made substantial progress using silver alginate. 9/14; bilateral plantar feet wounds secondary to prolonged  pressure. Making progress using silver alginate. 9/29 bilateral plantar feet wounds secondary to prolonged pressure. I changed him to Iodoflex last week.  MolecuLight showing reddened blush fluorescence 10/11; patient presents for follow-up. He has no issues or complaints today. He denies signs of infection. He continues to use Iodoflex and antibiotic ointment to the wound beds. 10/27; 2-week follow-up. No evidence of infection. He has callus and thick dry skin around the wound margins we have been using Iodoflex and Bactroban which was in response to a moderate left MolecuLight reddish blush fluorescence. 11/10; 2-week follow-up. Wound margins again have thick callus however the measurements of the actual wound sites are a lot smaller. Everything looks reasonably healthy here. We have been using Iodoflex He was approved for prime matrix but I have elected to delay this given the improvement in the surface area. Hopefully I will not regret that decision as were getting close to the end of the year in terms of insurance payment 12/8; 2-week follow-up. Wounds are generally smaller in size. These were initially substantial wounds extending into the forefoot all the way into the heel on the bilateral plantar feet. They are now both located on the plantar heel distal aspect both of these have a lot of callus around the wounds I used a #5 curette to remove this on the right and the left also some subcutaneous debris to try and get the wound edges were using Iodoflex. He has heel offloading shoe 12/22; 2-week follow-up. Not really much improvement. He has thick callus around the outer edges of both wounds. I remove this there is some nonviable subcutaneous tissue as well. We have been using Iodoflex. Her intake nurse and myself spontaneously thought of a total contact cast I went back in May. At that time we really were not seeing much of an improvement with a cast although the wound was in a much different situation I would like to retry this in 2 weeks and I discussed this with the patient 08/12/2021; the patient has had some improvement with the Iodoflex.  The the area on the left heel plantar more improved than the right. I had to put him in a total contact cast on the left although I decided to put that off for 2 weeks. I am going to change his primary dressing to silver collagen. I think in both areas he has had some improvement most of the healing seems to be more proximal in the heel. The wounds are in the mid aspect. A lot of thick callus on the right heel however. 1/19; we are using silver collagen on both plantar heel areas. He has had some improvement today. The left did not require any debridement. He still had some eschar on the right that was debrided but both seem to have contracted. I did not put it total contact cast on him today 2/2 we have been using silver collagen. The area on the right plantar heel has areas that appear to be epithelialized interspersed with dry flaking callus and dry skin. I removed this. This really looks better than on the other side. On the left still a large area with raised edges and debris on the surface. 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 He has no issues or complaints today. 2/9; not much change in the left foot wound with 1 week of a cast  we are using silver collagen. Silver collagen on the right side. The right side has been the better wound surface. We will reapply the total contact cast on the left 2/16; not much improvement on either side I been using silver collagen with a total contact cast on the left. I'm changing the Hydrofera Blue still with a total contact cast on the left 2/23; some improvement on both sides. Disappointing that he has thick callus around the area that we are putting in a total contact cast on the left. We've been using Hydrofera Blue on both wound areas. This is a man who at essentially pressure ulcers in addition to ischemia caused by  medications to support his blood pressure (pressors) in the ICU. He was being ventilated in the standing position for severe Covid. A Shiley the wounds extended across his entire foot but are now localized to his plantar heels bilaterally. We have made progress however neither areas healed. I continue to think the total contact cast is helped albeit painstakingly slowly. He has never wanted a plastic surgery consult although I don't know that they would be interested in grafting in area in this location. 10/07/2021: Continued improvement bilaterally. There is still some callus around the left wound, despite the total contact cast. He has some increased pain in his right midfoot around 1 particular area. This has been painful in the past but seems to be a little bit worse. When his cast was removed today, there was an area on the heel of the left foot that looks a bit macerated. He is also complaining of pain in his left thigh and hip which he thinks is secondary to the limb length discrepancy caused by the cast. 10/14/2021: He continues to improve. A little bit less callus around the left wound. He continues to endorse pain in his right midfoot, but this is not as significant as it was last week. The maceration on his left heel is improved. 10/21/2021: Continued improvement to both wounds. The maceration on his left heel is no longer evident. Less callus bilaterally. Epithelialization progressing. 10/28/2021: Significant improvement this week. The right sided wound is nearly closed with just a small open area at the middle. No maceration seen on the left heel. Continued epithelialization on both sides. No concern for infection. 11/04/2021: T oday, the wounds were measured a little bit differently and come out as larger, but I actually think they are about the same to potentially even smaller, particularly on the left. He continues to accumulate some callus on the right. 11/11/2021: T oday, the patient is  expressing some concern that the left wound, despite being in the total contact cast, is not progressing at the same rate as the 1 on the right. He is interested in trying a week without the cast to see how the wound does. The wounds are roughly the same size as last week, with the right perhaps being a little bit smaller. He continues to build up callus on both sites. MURAT, RIDEOUT ([id number]) [id number] Page 10 of 15 11/18/2021: Last week, I permitted the patient to go without his total contact cast, just to see if the cast was really making any difference. Today, both wounds have deteriorated to some extent, suggesting that the cast is providing benefit, at least on the left. Both are larger and have accumulated callus, slough, and other debris. 11/26/2021: I debrided both wounds quite aggressively last week in an effort to stimulate the healing cascade. This appears to have  been effective as the left sided wound is a full centimeter shorter in length. Although the right was measured slightly larger, on inspection, it looks as though an area of epithelialized tissue was included in the measurements. We have been using PolyMem Ag on the wound surfaces with a total contact cast to the left. 12/02/2021: It appears that the intake personnel are including epithelialized tissue in his wound measurements; the right wound is almost completely epithelialized; there is just a crater at the proximal midfoot with some open areas. On the left, he has built up some callus, but the overall wound surface looks good. There is some senescent skin around the wound margin. He has been in PolyMem Ag bilaterally with a total contact cast on the left. 12/09/2021: The right wound is nearly closed; there is just a small open area at the mid calcaneus. On the left, the wound is smaller with minimal callus buildup. No significant drainage. 12/16/2021: The right calcaneal wound remains minimally open  at the mid calcaneus; the rest has epithelialized. On the left, the wound is also a little bit smaller. There is some senescent tissue on the periphery. He is getting his first application of a trial skin substitute called Vendaje today. 12/23/2021: The wound on his right calcaneus is nearly closed; there is just a small area at the most distal aspect of the calcaneus that is open. On the left, the area where we applied to the skin substitute has a healthier-looking bed of granulation tissue. The wound dimensions are not significantly different on this side but the wound surface is improved. 12/30/2021: The wound on the right calcaneus has not changed significantly aside from some accumulation of callus. On the left, the open area is smaller and continues to have an improved surface. He continues to accumulate callus around the wound. He is here for his third application of Vendaje. 01/06/2022: The right calcaneal wound is down to just a couple of millimeters. He continues to accumulate periwound callus. He unfortunately got his cast wet earlier in the week and his left foot is macerated, resulting in some superficial skin loss just distal to the open wound. The open wound itself, however, is much smaller and has a healthier appearing surface. He is here for his fourth application of Vendaje. 01/13/2022: The right calcaneal wound is about the same. Unfortunately, once again, his cast got wet and his foot is again macerated. This is caused the left calcaneal wound to enlarge. He is here for his fifth application of Vendaje. 01/20/2022: The right calcaneal wound is very small. There is some periwound callus accumulation. He purchased a new cast protector last week and this has been effective in avoiding the maceration that has been occurring on the left. The left calcaneal wound is narrower and has a healthy and viable-appearing surface. He is here for his 6 application of Vendaje. 01/27/2022: The right  calcaneal wound is down to just a pinhole. There is some periwound slough and callus. On the left, the wound is narrower and shorter by about a centimeter. The surface is robust and viable-appearing. Unfortunately, the rep for the trial skin substitute product did not provide any for [place] to use today. 02/04/2022: The right calcaneal wound remains unchanged. There is more accumulated callus. On the left, although the intake nurse measured it a little bit longer, it looks about the same to me. The surface has a layer of slough, but underneath this, there is good granulation tissue. 02/10/2022: The right calcaneus  wound is nearly closed. There is still some callus that builds up around the site. The left side looks about the same in terms of dimensions, but the surface is more robust and vital-appearing. 02/16/2022: The area of the right calcaneus that was nearly closed last week has closed, but there is a small opening at the mid foot where it looks like some moisture got retained and caused some reopening. The left foot wound is narrower and shallower. Both sites have a fair amount of periwound callus and eschar. 02/24/2022: The small midfoot opening on the right calcaneus is a little bit smaller today. The left foot wound is narrower and shallower. He continues to accumulate periwound callus. No concern for infection. 03/01/2022: The patient came to clinic early because he showered and got his cast wet. Fortunately, there is no significant maceration to his foot but the callus softened and it looks like the wound on his left calcaneus may be a little bit wider. The wound on his right calcaneus is just a narrow slit. Continued accumulation of periwound callus bilaterally. 03/08/2022: The wound on his right calcaneus is very nearly closed, just a small pinpoint opening under a bit of eschar; the left wound has come in quite a bit, as well. It is narrower and shorter than at our last visit. Still with accumulated  callus and eschar bilaterally. 03/17/2022: The right calcaneal wound is healed. The left wound is smaller and the surface itself is very clean, but there is some blue-green staining on the periwound callus, concerning for Pseudomonas aeruginosa. 03/23/2022: The right calcaneal wound remains closed. The left wound continues to contract. No further blue-green staining. Small amount of callus and slough accumulation. 03/28/2022: He came in early today because he had gotten his cast wet. On inspection, the wound itself did not get wet or macerated, just a little bit of the forefoot. The wound itself is basically unchanged. 04/07/2022: The right foot wound remains closed. The left wound is the smallest that I have seen it to date. It is narrower and shorter. It still continues to accumulate slough on the surface. 04/15/2022: There is a band of epithelium now dividing the small left plantar foot wound in 2. There is still some slough on the surface. 04/21/2022: The wound continues to narrow. Just a little bit of slough on the surface. He seems to be responding well to endoform. 04/28/2022: Continued slow contraction of the wound. There is a little slough on the surface and some periwound callus. We have been using endoform and total contact cast. 05/05/2022: The wound appears to have stalled. There is slough and some periwound eschar/callus. No concern for infection, however. 05/12/2022: Unfortunately, his right foot has reopened. It is located at the most posterior aspect of his surgical incision. The area was noted to have drainage coming from it when his padding was removed today. Underneath some callus and senescent skin, there is an opening. No purulent drainage or malodor. On the left foot, the wound is again unchanged. There is some light blue staining on the callus, but no malodor or purulent drainage. 10/13; right and left heel remanence of extensive plantar foot wounds. These are better than I remember by  quite a big margin however he is still left with wounds on the left plantar heel and the right plantar heel. Been using endoform bilaterally. A culture was done that showed apparently Pseudomonas but we are still waiting for the [facility] antibiotic to use gentamicin today. He is still very  active by description I am not sure about the offloading of his noncasted right foot 10/20; both wounds right and left heel debrided not much change from last week. [name] Echevaria has arrived which is linezolid, gentamicin and ciprofloxacin we will use this with endoform. T contact cast on the left YESENIA, FONTENETTE ([id number]) [id number] Page 11 of 15 06/02/2022: Both wounds are smaller today. There is still a fair amount of callus buildup around the right foot ulcer. The left is more superficial and nearly flush with the surrounding tissues. Also with slough and eschar buildup. 06/10/2022: The right sided wound appears to be nearly closed, if not completely so, although it is somewhat difficult to tell given the abnormal tissue and scarring in his foot. There is a fair amount of callus and crust accumulation. On the left, the wound looks about the same, again with callus and slough. He has an appointment next Thursday with Dr. Annamary Rummage in podiatry; I am hopeful that there may be some reconstructive options available for Mr. Lefevers. 06/16/2022: Both wounds have some eschar and callus accumulation. The right sided wound is extremely narrow and barely open; the left is narrower than last week. There is a little bit of slough. He has his appointment in podiatry later today. 06/23/2022: The patient met with Dr. Annamary Rummage last week and unfortunately, there are no reconstructive options that he believes would be helpful. He did order an MRI to evaluate for osteomyelitis and fortunately, none was seen. The left sided wound is a little bit shorter and narrower today. The right sided wound  is about the same. There is callus and eschar accumulation bilaterally. 06/29/2022: Both feet have improved from last week. There is epithelium making a valiant effort to creep across the surface on the left. The right side looks like it got a little dry and the deep crevasse in his midfoot has cracked. Both have eschar and there is some slough on the left. 07/07/2022: Both feet have improved. There is epithelium completely covering the calcaneus on the right with just a small opening in the crevasse in his midfoot. On the left, the open area of tissue is smaller but he continues to build up callus/eschar and slough. 07/15/2022: The opening in the midfoot on the right is about the same size, covered with eschar and a little bit of slough. The open portion of the left wound is narrower and shorter with a bit of slough buildup. He admits to being on his feet more than recommended. 12/14; as far as I can tell everything on the right foot is closed. There is some eschar I removed some of this I cannot identify any open wound here. As usual this will be a very vulnerable area going forward. On the left this looks really quite healthy. I was pleasantly surprised to see how good this looked. Wound is certainly smaller and there appears to be healthy epithelialization. He has been using Promogran on the right and endoform on the left. He has been offloading the right foot with a heel offloading boot and he has a running shoe on the right foot 08/04/2022: The right foot remains closed. He has a thick cushioned insole in his sneaker. The left sided wound is smaller with just some slough and eschar accumulation. He is wearing the heel off loader on this foot. 08/15/2022: The right foot remains closed. The left wound has narrowed further. There is some slough and eschar accumulation. Patient History Information obtained from Patient. Family History  Cancer - Maternal Grandparents, Diabetes - Father,Paternal  Grandparents, Heart Disease - Maternal Grandparents, Hypertension - Father,Paternal Grandparents, Lung Disease - Siblings, No family history of Hereditary Spherocytosis, Kidney Disease, Seizures, Stroke, Thyroid Problems, Tuberculosis. Social History Never smoker, Marital Status - Married, Alcohol Use - Never, Drug Use - No History, Caffeine Use - Daily - tea, soda. Medical History Eyes Denies history of Cataracts, Glaucoma, Optic Neuritis Ear/Nose/Mouth/Throat Denies history of Chronic sinus problems/congestion, Middle ear problems Hematologic/Lymphatic Denies history of Anemia, Hemophilia, Human Immunodeficiency Virus, Lymphedema, Sickle Cell Disease Respiratory Patient has history of Asthma Denies history of Aspiration, Chronic Obstructive Pulmonary Disease (COPD), Pneumothorax, Sleep Apnea, Tuberculosis Cardiovascular Patient has history of Angina - with COVID, Hypertension Denies history of Arrhythmia, Congestive Heart Failure, Coronary Artery Disease, Deep Vein Thrombosis, Hypotension, Myocardial Infarction, Peripheral Arterial Disease, Peripheral Venous Disease, Phlebitis, Vasculitis Gastrointestinal Denies history of Cirrhosis , Colitis, Crohnoos, Hepatitis A, Hepatitis B, Hepatitis C Endocrine Denies history of Type I Diabetes, Type II Diabetes Genitourinary Denies history of End Stage Renal Disease Immunological Denies history of Lupus Erythematosus, Raynaudoos, Scleroderma Integumentary (Skin) Denies history of History of Burn Musculoskeletal Denies history of Gout, Rheumatoid Arthritis, Osteoarthritis, Osteomyelitis Neurologic Denies history of Dementia, Neuropathy, Quadriplegia, Paraplegia, Seizure Disorder Oncologic Denies history of Received Chemotherapy, Received Radiation Psychiatric Denies history of Anorexia/bulimia, Confinement Anxiety Hospitalization/Surgery History - COVID PNA 07/22/2019- 11/14/2019. - 03/27/2020 wound debridement/ skin graft. Medical A  Surgical History Notes nd Constitutional Symptoms (General Health) COVID PNA 07/22/2019-11/14/2019 VENT ECMO, foot drop left foot , Genitourinary kidney stone Psychiatric anxiety Morton StallGOURLEY, Ramzy E ([id number]) [id number] Page 12 of 15 Objective Constitutional no acute distress. Respiratory Normal work of breathing on room air. General Notes: 08/15/2022: The right foot remains closed. The left wound has narrowed further. There is some slough and eschar accumulation. Integumentary (Hair, Skin) Wound #2 status is Open. Original cause of wound was Pressure Injury. The date acquired was: 10/07/2019. The wound has been in treatment 108 weeks. The wound is located on the Left Calcaneus. The wound measures 0.6cm length x 0.3cm width x 0.1cm depth; 0.141cm^2 area and 0.014cm^3 volume. There is Fat Layer (Subcutaneous Tissue) exposed. There is no tunneling or undermining noted. There is a medium amount of serosanguineous drainage noted. The wound margin is thickened. There is large (67-100%) red, pink granulation within the wound bed. There is a small (1-33%) amount of necrotic tissue within the wound bed including Eschar and Adherent Slough. The periwound skin appearance had no abnormalities noted for color. The periwound skin appearance exhibited: Callus, Scarring, Maceration. Periwound temperature was noted as No Abnormality. Assessment Active Problems ICD-10 Non-pressure chronic ulcer of other part of left foot with fat layer exposed Procedures Wound #2 Pre-procedure diagnosis of Wound #2 is a Pressure Ulcer located on the Left Calcaneus . There was a Selective/Open Wound Non-Viable Tissue Debridement with a total area of 0.18 sq cm performed by Duanne Guessannon, Makeda Peeks, MD. With the following instrument(s): Curette to remove Non-Viable tissue/material. Material removed includes Eschar and Slough and after achieving pain control using Lidocaine 4% T opical Solution. No specimens  were taken. A time out was conducted at 09:59, prior to the start of the procedure. A Minimum amount of bleeding was controlled with Pressure. The procedure was tolerated well with a pain level of 0 throughout and a pain level of 0 following the procedure. Post Debridement Measurements: 0.6cm length x 0.3cm width x 0.1cm depth; 0.014cm^3 volume. Post debridement Stage noted as Category/Stage III. Character of Wound/Ulcer Post Debridement is  improved. Post procedure Diagnosis Wound #2: Same as Pre-Procedure General Notes: Scribed for Dr. Lady Gary by J.Scotton. Plan Follow-up Appointments: Return Appointment in 1 week. - Dr. Lady Gary Room 3 Anesthetic: Wound #2 Left Calcaneus: (In clinic) Topical Lidocaine 4% applied to wound bed - In clinic Edema Control - Lymphedema / SCD / Other: Avoid standing for long periods of time. Moisturize legs daily. - as needed Off-Loading: Other: - keep pressure off of the bottom of your feet. Globoped to left foot-when not wearing the cast Elevate legs throughout the day. Use the Shoe with the PegAssist off-loadinding insole Additional Orders / Instructions: Follow Nutritious Diet WOUND #2: - Calcaneus Wound Laterality: Left Cleanser: Normal Saline (Generic) Every Other Day/30 Days Discharge Instructions: Cleanse the wound with Normal Saline prior to applying a clean dressing using gauze sponges, not tissue or cotton balls. Cleanser: Wound Cleanser (Generic) Every Other Day/30 Days Discharge Instructions: Cleanse the wound with wound cleanser prior to applying a clean dressing using gauze sponges, not tissue or cotton balls. Prim Dressing: Endoform 2x2 in (Generic) Every Other Day/30 Days ary Discharge Instructions: Moisten with saline or Hydrogel Secondary Dressing: Optifoam Non-Adhesive Dressing, 4x4 in (Generic) Every Other Day/30 Days Discharge Instructions: Apply over primary dressing as directed. Secondary Dressing: Woven Gauze Sponge, Non-Sterile 4x4 in  (Generic) Every Other Day/30 Days Discharge Instructions: Apply over primary dressing as directed. Secured With: 14M Medipore H Soft Cloth Surgical T ape, 4 x 10 (in/yd) (Generic) Every Other Day/30 Days Discharge Instructions: Secure with tape as directed. DARRIO, BADE ([id number]) [id number] Page 13 of 15 08/15/2022: The right foot remains closed. The left wound has narrowed further. There is some slough and eschar accumulation. I used a curette to debride slough and eschar from the left heel wound. We will continue [facility] topical antibiotic compound and endoform to this site. I am going to change him into a peg assist shoe, rather than the heel off loader. I think this will provide better support while still offloading the wound. Follow-up in 1 week. Electronic Signature(s) Signed: 08/15/2022 10:14:11 AM By: [name] MD FACS Entered By: [name] on 08/15/2022 10:14:10 -------------------------------------------------------------------------------- HxROS Details Patient Name: Date of Service: [name], [name] 08/15/2022 9:30 A M Medical Record Number: [id number] Patient Account Number: [id number] Date of Birth/Sex: Treating RN: [birth year] (47 y.o. M) Primary Care Provider: Dorinda Hill Other Clinician: Referring Provider: Treating Provider/Extender: Jana Hakim in Treatment: 108 Information Obtained From Patient Constitutional Symptoms (General Health) Medical History: Past Medical History Notes: COVID PNA 07/22/2019-11/14/2019 VENT ECMO, foot drop left foot , Eyes Medical History: Negative for: Cataracts; Glaucoma; Optic Neuritis Ear/Nose/Mouth/Throat Medical History: Negative for: Chronic sinus problems/congestion; Middle ear problems Hematologic/Lymphatic Medical History: Negative for: Anemia; Hemophilia; Human Immunodeficiency Virus; Lymphedema; Sickle Cell Disease Respiratory Medical History: Positive for:  Asthma Negative for: Aspiration; Chronic Obstructive Pulmonary Disease (COPD); Pneumothorax; Sleep Apnea; Tuberculosis Cardiovascular Medical History: Positive for: Angina - with COVID; Hypertension Negative for: Arrhythmia; Congestive Heart Failure; Coronary Artery Disease; Deep Vein Thrombosis; Hypotension; Myocardial Infarction; Peripheral Arterial Disease; Peripheral Venous Disease; Phlebitis; Vasculitis Gastrointestinal Medical History: Negative for: Cirrhosis ; Colitis; Crohns; Hepatitis A; Hepatitis B; Hepatitis C Endocrine Medical History: Negative for: Type I Diabetes; Type II Diabetes HARSHAL, SIRMON ([id number]) [id number] Page 14 of 15 Genitourinary Medical History: Negative for: End Stage Renal Disease Past Medical History Notes: kidney stone Immunological Medical History: Negative for: Lupus Erythematosus; Raynauds; Scleroderma Integumentary (Skin) Medical History: Negative for: History of Burn Musculoskeletal Medical History:  Negative for: Gout; Rheumatoid Arthritis; Osteoarthritis; Osteomyelitis Neurologic Medical History: Negative for: Dementia; Neuropathy; Quadriplegia; Paraplegia; Seizure Disorder Oncologic Medical History: Negative for: Received Chemotherapy; Received Radiation Psychiatric Medical History: Negative for: Anorexia/bulimia; Confinement Anxiety Past Medical History Notes: anxiety Immunizations Pneumococcal Vaccine: Received Pneumococcal Vaccination: No Implantable Devices None Hospitalization / Surgery History Type of Hospitalization/Surgery COVID PNA 07/22/2019- 11/14/2019 03/27/2020 wound debridement/ skin graft Family and Social History Cancer: Yes - Maternal Grandparents; Diabetes: Yes - Father,Paternal Grandparents; Heart Disease: Yes - Maternal Grandparents; Hereditary Spherocytosis: No; Hypertension: Yes - Father,Paternal Grandparents; Kidney Disease: No; Lung Disease: Yes - Siblings; Seizures: No;  Stroke: No; Thyroid Problems: No; Tuberculosis: No; Never smoker; Marital Status - Married; Alcohol Use: Never; Drug Use: No History; Caffeine Use: Daily - tea, soda; Financial Concerns: No; Food, Clothing or Shelter Needs: No; Support System Lacking: No; Transportation Concerns: No Electronic Signature(s) Signed: 08/15/2022 11:45:20 AM By: [name] MD FACS Entered By: [name] on 08/15/2022 10:12:42 -------------------------------------------------------------------------------- SuperBill Details Patient Name: Date of Service: [name], [name] 08/15/2022 Medical Record Number: [id number] Patient Account Number: [id number] Date of Birth/Sex: Treating RN: 1975/09/26 (46 y.o. Willey Blade ([id number]) [id number] Page 15 of 15 Primary Care Provider: Dorinda Hill Other Clinician: Referring Provider: Treating Provider/Extender: Jana Hakim in Treatment: 108 Diagnosis Coding ICD-10 Codes Code Description [phone] Non-pressure chronic ulcer of other part of left foot with fat layer exposed Facility Procedures : CPT4 Code: [id number] Description: 97597 - DEBRIDE WOUND 1ST 20 SQ CM OR < ICD-10 Diagnosis Description L97.522 Non-pressure chronic ulcer of other part of left foot with fat layer exposed Modifier: Quantity: 1 Physician Procedures : CPT4 Code Description Modifier [id number] 99214 - WC PHYS LEVEL 4 - EST PT 25 ICD-10 Diagnosis Description L97.522 Non-pressure chronic ulcer of other part of left foot with fat layer exposed Quantity: 1 : [id number] 97597 - WC PHYS DEBR WO ANESTH 20 SQ CM ICD-10 Diagnosis Description L97.522 Non-pressure chronic ulcer of other part of left foot with fat layer exposed Quantity: 1 Electronic Signature(s) Signed: 08/15/2022 10:14:23 AM By: [name] MD FACS Entered By: [name] on 08/15/2022 10:14:23

## 2022-08-16 NOTE — Progress Notes (Signed)
MARIO, CORONADO ([id number]) [id number] Page 1 of 7 Visit Report for 08/15/2022 Arrival Information Details Patient Name: Date of Service: [name], TO [name] 08/15/2022 9:30 A M Medical Record Number: [id number] Patient Account Number: [id number] Date of Birth/Sex: Treating RN: Jun 26, 1976 (47 y.o. [name] Primary Care Calyb Mcquarrie: Dorinda Hill Other Clinician: Referring Janne Faulk: Treating Anastyn Ayars/Extender: Jana Hakim in Treatment: 108 Visit Information History Since Last Visit Added or deleted any medications: No Patient Arrived: Wheel Chair Any new allergies or adverse reactions: No Arrival Time: 09:30 Had a fall or experienced change in No Accompanied By: spouse activities of daily living that may affect Transfer Assistance: Manual risk of falls: Patient Identification Verified: Yes Signs or symptoms of abuse/neglect since last visito No Patient Requires Transmission-Based Precautions: No Implantable device outside of the clinic excluding No Patient Has Alerts: No cellular tissue based products placed in the center since last visit: Has Dressing in Place as Prescribed: Yes Pain Present Now: No Electronic Signature(s) Signed: 08/15/2022 5:05:50 PM By: [name] RN Entered By: [name] on 08/15/2022 09:58:45 -------------------------------------------------------------------------------- Encounter Discharge Information Details Patient Name: Date of Service: [name], [name] 08/15/2022 9:30 A M Medical Record Number: [id number] Patient Account Number: [id number] Date of Birth/Sex: Treating RN: 1976-06-20 (47 y.o. [name] Primary Care Axzel Rockhill: Dorinda Hill Other Clinician: Referring Maxwel Meadowcroft: Treating Vinh Sachs/Extender: Jana Hakim in Treatment: [phone] Encounter Discharge Information Items Post Procedure Vitals Discharge Condition: Stable Temperature (F): 98.8 Ambulatory Status:  Wheelchair Pulse (bpm): 86 Discharge Destination: Home Respiratory Rate (breaths/min): 16 Transportation: Private Auto Blood Pressure (mmHg): 136/70 Accompanied By: spouse Schedule Follow-up Appointment: Yes Clinical Summary of Care: Patient Declined Electronic Signature(s) Signed: 08/15/2022 5:05:50 PM By: [name] RN Entered By: [name] on 08/15/2022 11:16:32 [name] ([id number]) [id number] Page 2 of 7 -------------------------------------------------------------------------------- Lower Extremity Assessment Details Patient Name: Date of Service: [name] 08/15/2022 9:30 A M Medical Record Number: [id number] Patient Account Number: [id number] Date of Birth/Sex: Treating RN: Jun 17, 1976 (47 y.o. [name] Primary Care Taneah Masri: Dorinda Hill Other Clinician: Referring Corran Lalone: Treating Laurent Cargile/Extender: Jana Hakim in Treatment: 108 Edema Assessment Assessed: [name]: No] Franne Forts: No] Edema: [Left: N] [Right: o] Calf Left: Right: Point of Measurement: 29 cm From Medial Instep 44.1 cm 44.8 cm Ankle Left: Right: Point of Measurement: 9 cm From Medial Instep 24.7 cm 25.1 cm Vascular Assessment Pulses: Dorsalis Pedis Palpable: [Left:Yes] Electronic Signature(s) Signed: 08/15/2022 5:05:50 PM By: [name] RN Entered By: [name] on 08/15/2022 11:14:08 -------------------------------------------------------------------------------- Multi Wound Chart Details Patient Name: Date of Service: [name], [name] 08/15/2022 9:30 A M Medical Record Number: [id number] Patient Account Number: [id number] Date of Birth/Sex: Treating RN: [birth year] (47 y.o. M) Primary Care Marypat Kimmet: Dorinda Hill Other Clinician: Referring Jazier Mcglamery: Treating Lallie Strahm/Extender: Jana Hakim in Treatment: 108 [2:Photos:] [N/A:N/A] Left Calcaneus N/A N/A Wound Location: Pressure Injury N/A  N/A Wounding Event: Pressure Ulcer N/A N/A Primary Etiology: Asthma, Angina, Hypertension N/A N/A Comorbid History: 10/07/2019 N/A N/A Date Acquired: 108 N/A N/A Weeks of Treatment: Open N/A N/A Wound StatusPAYTON, PRINSEN ([id number]) [id number] Page 3 of 7 No N/A N/A Wound Recurrence: 0.6x0.3x0.1 N/A N/A Measurements L x W x D (cm) 0.141 N/A N/A A (cm) : rea 0.014 N/A N/A Volume (cm) : 99.50% N/A N/A % Reduction in A rea: 99.90% N/A N/A % Reduction in Volume: Category/Stage III N/A N/A Classification: Medium N/A N/A Exudate  A mount: Serosanguineous N/A N/A Exudate Type: red, brown N/A N/A Exudate Color: Thickened N/A N/A Wound Margin: Large (67-100%) N/A N/A Granulation A mount: Red, Pink N/A N/A Granulation Quality: Small (1-33%) N/A N/A Necrotic A mount: Eschar, Adherent Slough N/A N/A Necrotic Tissue: Fat Layer (Subcutaneous Tissue): Yes N/A N/A Exposed Structures: Fascia: No Tendon: No Muscle: No Joint: No Bone: No Medium (34-66%) N/A N/A Epithelialization: Debridement - Selective/Open Wound N/A N/A Debridement: Pre-procedure Verification/Time Out 09:59 N/A N/A Taken: Lidocaine 4% Topical Solution N/A N/A Pain Control: Necrotic/Eschar, Slough N/A N/A Tissue Debrided: Non-Viable Tissue N/A N/A Level: 0.18 N/A N/A Debridement A (sq cm): rea Curette N/A N/A Instrument: Minimum N/A N/A Bleeding: Pressure N/A N/A Hemostasis A chieved: 0 N/A N/A Procedural Pain: 0 N/A N/A Post Procedural Pain: Procedure was tolerated well N/A N/A Debridement Treatment Response: 0.6x0.3x0.1 N/A N/A Post Debridement Measurements L x W x D (cm) 0.014 N/A N/A Post Debridement Volume: (cm) Category/Stage III N/A N/A Post Debridement Stage: Callus: Yes N/A N/A Periwound Skin Texture: Scarring: Yes Maceration: Yes N/A N/A Periwound Skin Moisture: No Abnormalities Noted N/A N/A Periwound Skin Color: No Abnormality N/A  N/A Temperature: Debridement N/A N/A Procedures Performed: Treatment Notes Electronic Signature(s) Signed: 08/15/2022 10:11:29 AM By: Fredirick Maudlin MD FACS Entered By: Fredirick Maudlin on 08/15/2022 10:11:28 -------------------------------------------------------------------------------- Multi-Disciplinary Care Plan Details Patient Name: Date of Service: [name], [place] 08/15/2022 9:30 A M Medical Record Number: [id number] Patient Account Number: [id number] Date of Birth/Sex: Treating RN: 04-28-1976 (47 y.o. [name] Primary Care Uzair Godley: [name] Other Clinician: Referring Angely Dietz: Treating Yaniel Limbaugh/Extender: [name] in Treatment: [place] reviewed with physician Active Inactive Wound/Skin Impairment Nursing Diagnoses: Impaired tissue integrity Knowledge deficit related to ulceration/compromised skin integrity AZUL, [name] ([id number]) [id number] Page 4 of 7 Goals: Patient/caregiver will verbalize understanding of skin care regimen Date Initiated: 07/15/2020 Target Resolution Date: 03/08/2023 Goal Status: Active Ulcer/skin breakdown will have a volume reduction of 30% by week 4 Date Initiated: 07/15/2020 Date Inactivated: 08/20/2020 Target Resolution Date: 09/03/2020 Goal Status: Unmet Unmet Reason: no major changes. Ulcer/skin breakdown will heal within 14 weeks Date Initiated: 12/04/2020 Date Inactivated: 12/10/2020 Target Resolution Date: 12/10/2020 Unmet Reason: wounds still open at 14 Goal Status: Unmet weeks and today 21 weeks. Interventions: Assess patient/caregiver ability to obtain necessary supplies Assess patient/caregiver ability to perform ulcer/skin care regimen upon admission and as needed Assess ulceration(s) every visit Provide education on ulcer and skin care Treatment Activities: Skin care regimen initiated : 07/15/2020 Topical wound management initiated :  07/15/2020 Notes: Electronic Signature(s) Signed: 08/15/2022 5:05:50 PM By: [name] Entered By: [name] on 08/15/2022 11:14:35 -------------------------------------------------------------------------------- Pain Assessment Details Patient Name: Date of Service: [name], [place] 08/15/2022 9:30 A M Medical Record Number: [id number] Patient Account Number: [id number] Date of Birth/Sex: Treating RN: Oct 06, 1975 (47 y.o. [name] Primary Care Giovani Neumeister: [name] Other Clinician: Referring Adham Johnson: Treating Ashaya Raftery/Extender: [name] in Treatment: 108 Active Problems Location of Pain Severity and Description of Pain Patient Has Paino No Site Locations Pain Management and Medication Current Pain Management: Electronic Signature(s) Signed: 08/15/2022 5:05:50 PM By: [name] Jonnie Finner, TONY1/03/2023 5:05:50 PM By: [name] Signed: E ([id number]) [id number] Page 5 of 7 Entered By: [name] on 08/15/2022 11:13:41 -------------------------------------------------------------------------------- Patient/Caregiver Education Details Patient Name: Date of Service: [name] 1/8/2024andnbsp9:30 A M Medical Record Number: [id number] Patient Account Number: [id number] Date of Birth/Gender: Treating  RN: [birth year] (46 y.o. [name] Primary Care Physician: Dorinda Hill Other Clinician: Referring Physician: Treating Physician/Extender: Jana Hakim in Treatment: 108 Education Assessment Education Provided To: Patient Education Topics Provided Wound/Skin Impairment: Methods: Explain/Verbal Responses: Return demonstration correctly Electronic Signature(s) Signed: 08/15/2022 5:05:50 PM By: [name] RN Entered By: [name] on 08/15/2022 11:14:49 -------------------------------------------------------------------------------- Wound Assessment  Details Patient Name: Date of Service: [name], [name] 08/15/2022 9:30 A M Medical Record Number: [id number] Patient Account Number: [id number] Date of Birth/Sex: Treating RN: Dec 19, 1975 (46 y.o. [name] Primary Care [name] Ress: Dorinda Hill Other Clinician: Referring Jeremiah Tarpley: Treating Dane Kopke/Extender: Jana Hakim in Treatment: 108 Wound Status Wound Number: 2 Primary Etiology: Pressure Ulcer Wound Location: Left Calcaneus Wound Status: Open Wounding Event: Pressure Injury Comorbid History: Asthma, Angina, Hypertension Date Acquired: 10/07/2019 Weeks Of Treatment: 108 Clustered Wound: No Photos DEYLAN, [place] ([id number]) [id number] Page 6 of 7 Wound Measurements Length: (cm) 0.6 Width: (cm) 0.3 Depth: (cm) 0.1 Area: (cm) 0.141 Volume: (cm) 0.014 % Reduction in Area: 99.5% % Reduction in Volume: 99.9% Epithelialization: Medium (34-66%) Tunneling: No Undermining: No Wound Description Classification: Category/Stage III Wound Margin: Thickened Exudate Amount: Medium Exudate Type: Serosanguineous Exudate Color: red, brown Foul Odor After Cleansing: No Slough/Fibrino Yes Wound Bed Granulation Amount: Large (67-100%) Exposed Structure Granulation Quality: Red, Pink Fascia Exposed: No Necrotic Amount: Small (1-33%) Fat Layer (Subcutaneous Tissue) Exposed: Yes Necrotic Quality: Eschar, Adherent Slough Tendon Exposed: No Muscle Exposed: No Joint Exposed: No Bone Exposed: No Periwound Skin Texture Texture Color No Abnormalities Noted: No No Abnormalities Noted: Yes Callus: Yes Temperature / Pain Scarring: Yes Temperature: No Abnormality Moisture No Abnormalities Noted: No Maceration: Yes Treatment Notes Wound #2 (Calcaneus) Wound Laterality: Left Cleanser Normal Saline Discharge Instruction: Cleanse the wound with Normal Saline prior to applying a clean dressing using gauze sponges, not tissue or  cotton balls. Wound Cleanser Discharge Instruction: Cleanse the wound with wound cleanser prior to applying a clean dressing using gauze sponges, not tissue or cotton balls. Peri-Wound Care Topical Primary Dressing Endoform 2x2 in Discharge Instruction: Moisten with saline or Hydrogel Secondary Dressing Optifoam Non-Adhesive Dressing, 4x4 in Discharge Instruction: Apply over primary dressing as directed. Woven Gauze Sponge, Non-Sterile 4x4 in Discharge Instruction: Apply over primary dressing as directed. Secured With 97M Medipore H Soft Cloth Surgical T ape, 4 x 10 (in/yd) Discharge Instruction: Secure with tape as directed. Compression Wrap Compression Stockings Add-Ons Electronic Signature(s) Signed: 08/15/2022 5:05:50 PM By: [name] RN Entered By: [name] on 08/15/2022 12:44:00 [name] ([id number]) [id number] Page 7 of 7 -------------------------------------------------------------------------------- Vitals Details Patient Name: Date of Service: [name], TO [name] 08/15/2022 9:30 A M Medical Record Number: [id number] Patient Account Number: [id number] Date of Birth/Sex: Treating RN: [birth year] (47 y.o. [name] Primary Care Rennae Ferraiolo: Dorinda Hill Other Clinician: Referring Michaeljames Milnes: Treating Cristian Davitt/Extender: Jana Hakim in Treatment: 108 Vital Signs Time Taken: 09:35 Temperature (F): 98.8 Height (in): 69 Pulse (bpm): 86 Weight (lbs): 280 Respiratory Rate (breaths/min): 16 Body Mass Index (BMI): 41.3 Blood Pressure (mmHg): 136/70 Reference Range: 80 - 120 mg / dl Electronic Signature(s) Signed: 08/15/2022 5:05:50 PM By: [name] RN Entered By: [name] on 08/15/2022 11:13:34

## 2022-08-23 ENCOUNTER — Ambulatory Visit (INDEPENDENT_AMBULATORY_CARE_PROVIDER_SITE_OTHER): Payer: PPO | Admitting: Family Medicine

## 2022-08-23 ENCOUNTER — Encounter (INDEPENDENT_AMBULATORY_CARE_PROVIDER_SITE_OTHER): Payer: Self-pay | Admitting: Family Medicine

## 2022-08-23 VITALS — BP 146/92 | HR 63 | Temp 97.9°F | Ht 68.0 in | Wt 308.0 lb

## 2022-08-23 DIAGNOSIS — E669 Obesity, unspecified: Secondary | ICD-10-CM

## 2022-08-23 DIAGNOSIS — U099 Post covid-19 condition, unspecified: Secondary | ICD-10-CM

## 2022-08-23 DIAGNOSIS — F32A Depression, unspecified: Secondary | ICD-10-CM | POA: Insufficient documentation

## 2022-08-23 DIAGNOSIS — F3289 Other specified depressive episodes: Secondary | ICD-10-CM | POA: Diagnosis not present

## 2022-08-23 DIAGNOSIS — R0609 Other forms of dyspnea: Secondary | ICD-10-CM | POA: Diagnosis not present

## 2022-08-23 DIAGNOSIS — R7303 Prediabetes: Secondary | ICD-10-CM

## 2022-08-23 DIAGNOSIS — Z0289 Encounter for other administrative examinations: Secondary | ICD-10-CM

## 2022-08-23 DIAGNOSIS — Z6841 Body Mass Index (BMI) 40.0 and over, adult: Secondary | ICD-10-CM | POA: Diagnosis not present

## 2022-08-24 DIAGNOSIS — Z23 Encounter for immunization: Secondary | ICD-10-CM | POA: Diagnosis not present

## 2022-08-24 DIAGNOSIS — Z6841 Body Mass Index (BMI) 40.0 and over, adult: Secondary | ICD-10-CM | POA: Diagnosis not present

## 2022-08-24 DIAGNOSIS — J45909 Unspecified asthma, uncomplicated: Secondary | ICD-10-CM | POA: Diagnosis not present

## 2022-08-24 DIAGNOSIS — R7303 Prediabetes: Secondary | ICD-10-CM | POA: Diagnosis not present

## 2022-08-24 DIAGNOSIS — N529 Male erectile dysfunction, unspecified: Secondary | ICD-10-CM | POA: Diagnosis not present

## 2022-08-24 DIAGNOSIS — I10 Essential (primary) hypertension: Secondary | ICD-10-CM | POA: Diagnosis not present

## 2022-08-24 DIAGNOSIS — G7281 Critical illness myopathy: Secondary | ICD-10-CM | POA: Diagnosis not present

## 2022-08-24 DIAGNOSIS — F339 Major depressive disorder, recurrent, unspecified: Secondary | ICD-10-CM | POA: Diagnosis not present

## 2022-08-24 DIAGNOSIS — J9610 Chronic respiratory failure, unspecified whether with hypoxia or hypercapnia: Secondary | ICD-10-CM | POA: Diagnosis not present

## 2022-08-25 ENCOUNTER — Encounter (HOSPITAL_BASED_OUTPATIENT_CLINIC_OR_DEPARTMENT_OTHER): Payer: PPO | Admitting: General Surgery

## 2022-08-25 DIAGNOSIS — L97522 Non-pressure chronic ulcer of other part of left foot with fat layer exposed: Secondary | ICD-10-CM | POA: Diagnosis not present

## 2022-08-25 DIAGNOSIS — L89623 Pressure ulcer of left heel, stage 3: Secondary | ICD-10-CM | POA: Diagnosis not present

## 2022-08-25 NOTE — Progress Notes (Signed)
[name], [name] ([id number]) [id number] Page 1 of 15 Visit Report for 08/25/2022 Chief Complaint Document Details Patient Name: Date of Service: [name], [name] 08/25/2022 9:30 A M Medical Record Number: [id number] Patient Account Number: [id number] Date of Birth/Sex: Treating RN: [birth year] (47 y.o. M) Primary Care Provider: Dorinda Hill Other Clinician: Referring Provider: Treating Provider/Extender: Jana Hakim in Treatment: 110 Information Obtained from: Patient Chief Complaint Bilateral Plantar Foot Ulcers Electronic Signature(s) Signed: 08/25/2022 10:23:57 AM By: [name] MD FACS Entered By: [name] on 08/25/2022 10:23:57 -------------------------------------------------------------------------------- Debridement Details Patient Name: Date of Service: [name], TO [place] [name] 08/25/2022 9:30 A M Medical Record Number: [id number] Patient Account Number: [id number] Date of Birth/Sex: Treating RN: 1976-01-02 (47 y.o. [name] Primary Care Provider: Dorinda Hill Other Clinician: Referring Provider: Treating Provider/Extender: Jana Hakim in Treatment: 110 Debridement Performed for Assessment: Wound #2 Left Calcaneus Performed By: Physician [name], MD Debridement Type: Debridement Level of Consciousness (Pre-procedure): Awake and Alert Pre-procedure Verification/Time Out Yes - 09:55 Taken: Start Time: 09:55 Pain Control: Lidocaine 5% topical ointment T Area Debrided (L x W): otal 0.1 (cm) x 0.1 (cm) = 0.01 (cm) Tissue and other material debrided: Non-Viable, Eschar, Slough, Slough Level: Non-Viable Tissue Debridement Description: Selective/Open Wound Instrument: Curette Bleeding: Minimum Hemostasis Achieved: Pressure End Time: 09:56 Procedural Pain: 0 Post Procedural Pain: 0 Response to Treatment: Procedure was tolerated well Level of Consciousness (Post- Awake and  Alert procedure): Post Debridement Measurements of Total Wound Length: (cm) 0.1 Stage: Category/Stage III Width: (cm) 0.1 Depth: (cm) 0.1 Volume: (cm) 0.001 Character of Wound/Ulcer Post Debridement: Improved Post Procedure Diagnosis [name], [name] ([id number]) [id number] Page 2 of 15 Same as Pre-procedure Notes Scribed for Dr. Lady Gary by J.Scotton Electronic Signature(s) Signed: 08/25/2022 10:47:40 AM By: [name] MD FACS Signed: 08/25/2022 5:39:26 PM By: [name] RN Entered By: [name] on 08/25/2022 10:18:06 -------------------------------------------------------------------------------- HPI Details Patient Name: Date of Service: [name], TO [place] [name] 08/25/2022 9:30 A M Medical Record Number: [id number] Patient Account Number: [id number] Date of Birth/Sex: Treating RN: 10/19/1975 (47 y.o. M) Primary Care Provider: Dorinda Hill Other Clinician: Referring Provider: Treating Provider/Extender: Jana Hakim in Treatment: 110 History of Present Illness HPI Description: Wounds are12/03/2020 upon evaluation today patient presents for initial inspection here in our clinic concerning issues he has been having with the bottoms of his feet bilaterally. He states these actually occurred as wounds when he was hospitalized for 5 months secondary to Covid. He was apparently with tilting bed where he was in an upright position quite frequently and apparently this occurred in some way shape or form during that time. Fortunately there is no sign of active infection at this time. No fevers, chills, nausea, vomiting, or diarrhea. With that being said he still has substantial wounds on the plantar aspects of his feet Theragen require quite a bit of work to get these to heal. He has been using Santyl currently though that is been problematic both in receiving the medication as well as actually paid for it as it is become quite  expensive. Prior to the experience with Covid the patient really did not have any major medical problems other than hypertension he does have some mild generalized weakness following the Covid experience. 07/22/2020 on evaluation today patient appears to be doing okay in regard to his foot ulcers I feel like the wound beds are showing signs of better improvement that I do believe the Iodoflex  is helping in this regard. With that being said he does have a lot of drainage currently and this is somewhat blue/green in nature which is consistent with Pseudomonas. I do think a culture today would be appropriate for us to evaluate and see if that is indeed the case I would likely start him on antibiotic orally as well he is not allergic to Cipro knows of no issues he has had in the past 12/21; patient was admitted to the clinic earlier this month with bilateral presumed pressure ulcers on the bottom of his feet apparently related to excessive pressure from a tilt table arrangement in the intensive care unit. Patient relates this to being on ECMO but I am not really sure that is exactly related to that. I must say I have never seen anything like this. He has fairly extensive full-thickness wounds extending from his heel towards his midfoot mostly centered laterally. There is already been some healing distally. He does not appear to have an arterial issue. He has been using gentamicin to the wound surfaces with Iodoflex to help with ongoing debridement 1/6; this is a patient with pressure ulcers on the bottom of his feet related to excessive pressure from a standing position in the intensive care unit. He is complaining of a lot of pain in the right heel. He is not a diabetic. 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 We have been using Iodoflex to help prepare the surfaces of both wounds for an advanced treatment product. He is nonambulatory spending most of his time in a wheelchair  I have asked him not to propel the wheelchair with his heels 1/13; in general his wounds look better not much surface area change we have been using Iodoflex as of last week. I did an x-ray of the right heel as the patient was complaining of pain. I had some thoughts about a stress fracture perhaps Achilles tendon problems however what it showed was erosive changes along the inferior aspect of the calcaneus he now has a MRI booked for 1/20. 1/20; in general his wounds continue to be better. Some improvement in the large narrow areas proximally in his foot. He is still complaining of pain in the right heel and tenderness in certain areas of this wound. His MRI is tonight. I am not just looking for osteomyelitis that was brought up on the x-ray I am wondering about stress fractures, tendon ruptures etc. He has no such findings on the left. Also noteworthy is that the patient had critical illness neuropathy and some of the discomfort may be actual improvement in nerve function I am just not sure. These wounds were initially in the setting of severe critical illness related to COVID-19. He was put in a standing position. He may have also been on pressors at the point contributing to tissue ischemia. By his description at some point these wounds were grossly necrotic extending proximally up into the Achilles part of his heel. I do not know that I have ever really seen pictures of them like this although they may exist in epic We have ordered Tri layer Oasis. I am trying to stimulate some granulation in these areas. This is of course assuming the MRI is negative for infection 1/27; since the patient was last here he saw Dr. Earlene PlaterWallace of infectious disease. He is planned for vancomycin and ceftriaxone. Prior operative culture grew MSSA. Also ordered baseline lab work. He also ordered arterial studies although the ABIs in our  clinic were normal as well as his clinical exam these were normal I do not think he  needs to see vascular surgery. His ABIs at the [facility] were 1.22 in the right triphasic waveforms with a normal TBI of 1.15 on the left ABI of 1.22 with triphasic waveforms and a normal TBI of 1.08. Finally he saw Dr. Logan Bores who will follow him in for 2 months. At this point I do not think he felt that he needed a procedure on the right calcaneal bone. Dr. Earlene Plater is elected for broad-spectrum antibiotic The patient is still having pain in the right heel. He walks with a walker 2/3; wounds are generally smaller. He is tolerating his IV antibiotics. I believe this is vancomycin and ceftriaxone. We are still waiting for Oasis burn in terms of his out-of-pocket max which he should be meeting soon given the IV antibiotics, MRIs etc. I have asked him to check in on this. We are using silver collagen in the meantime the wounds look better 2/10; tolerating IV vancomycin and Rocephin. We are waiting to apply for Oasis. Although I am not really sure where he is in his out-of-pocket max. 2/17 started the first application of Oasis trilayer. Still on antibiotics. The wounds have generally look better. The area on the left has a little more surface [name], [name] ([id number]) [id number] Page 3 of 15 slough requiring debridement 2/24; second application of Oasis trilayer. The wound surface granulation is generally look better. The area on the left with undermining laterally I think is come in a bit. 10/08/2020 upon evaluation today patient is here today for [employer] application #3. Fortunately he seems to be doing extremely well with regard to this and we are seeing a lot of new epithelial growth which is great news. Fortunately there is no signs of active infection at this time. 10/16/2020 upon evaluation today patient appears to be doing well with regard to his foot ulcers. Do believe the Oasis has been of benefit for him. I do not see any signs of infection right now which is great  news and I think that he has a lot of new epithelial growth which is great to see as well. The patient is very pleased to hear all of this. I do think we can proceed with the Oasis trilayer #4 today. 3/18; not as much improvement in these areas on his heels that I was hoping. I did reapply trilateral Oasis today the tissue looks healthier but not as much fill in as I was hoping. 3/25; better looking today I think this is come in a bit the tissue looks healthier. Triple layer Oasis reapplied #6 4/1; somewhat better looking definitely better looking surface not as much change in surface area as I was hoping. He may be spending more time [place] on days then he needs to although he does have heel offloading boots. Triple layer Oasis reapplied #7 4/7; unfortunately apparently [facility] will not approve any further Oasis which is unfortunate since the patient did respond nicely both in terms of the condition of the wound bed as well as surface area. There is still some drainage coming from the wound but not a lot there does not appear to be any infection 4/15; we have been using Hydrofera Blue. He continues to have nice rims of epithelialization on the right greater than the left. The left the epithelialization is coming from the tip of his heel. There is moderate drainage. In this that concerns  me about a total contact cast. There is no evidence of infection 4/29; patient has been using Hydrofera Blue with dressing changes. He has no complaints or issues today. 5/5; using Hydrofera Blue. I actually think that he looks marginally better than the last time I saw this 3 weeks ago. There are rims of epithelialization on the left thumb coming from the medial side on the right. Using Hydrofera Blue 5/12; using Hydrofera Blue. These continue to make improvements in surface area. His drainage was not listed as severe I therefore went ahead and put a cast on the left foot. Right foot we will continue  to dress his previous 5/16; back for first total contact cast change. He did not tolerate this particularly well cast injury on the anterior tibia among other issues. Difficulty sleeping. I talked him about this in some detail and afterwards is elected to continue. I told him I would like to have a cast on for 3 weeks to see if this is going to help at all. I think he agreed 5/19; I think the wound is better. There is no tunneling towards his midfoot. The undermining medially also looks better. He has a rim of new skin distally. I think we are making progress here. The area on the left also continues to look somewhat better to me using Hydrofera Blue. He has a list of complaints about the cast but none of them seem serious 5/26; patient presents for 1 week follow-up. He has been using a total contact cast and tolerating this well. Hydrofera Blue is the main dressing used. He denies signs of infection. 6/2 Hydrofera Blue total contact cast on the left. These were large ulcers that formed in intensive care unit where the patient was recovering from COVID. May have had something to do with being ventilated in an upright positiono Pressors etc. We have been able to get the areas down considerably and a viable surface. There is some epithelialization in both sides. Note made of drainage 6/9; changed to [facility] last time because of drainage. He arrives with better looking surfaces and dimensions on the left than the right. Paradoxically the right actually probes more towards his midfoot the left is largely close down but both of these look improved. Using a total contact cast on the left 6/16; complex wounds on his bilateral plantar heels which were initially pressure injury from a stay in the ICU with COVID. We have been using silver alginate most recently. His dimensions of come in quite dramatically however not recently. We have been putting the left foot in a total contact cast 6/23; complex  wounds on the bilateral plantar heels. I been putting the left in the cast paradoxically the area on the right is the one that is going towards closure at a faster rate. Quite a bit of drainage on the left. The patient went to see Dr. Logan Bores who said he was going to standby for skin grafts. I had actually considered sending him for skin grafts however he would be mandatorily off his feet for a period of weeks to months. I am thinking that the area on the right is going to close on its own the area on the left has been more stubborn even though we have him in a total contact cast 6/30; took him out of a total contact cast last week is the right heel seem to be making better progress than the left where I was placing the cast. We are using silver  alginate. Both wounds are smaller right greater than left 7/12; both wounds look as though they are making some progress. We are using silver alginate. Heel offloading boots 7/26; very gradual progress especially on the right. Using silver alginate. He is wearing heel offloading boots 8/18; he continues to close these wounds down very gradually. Using silver alginate. The problem polymen being definitive about this is areas of what appears to be callus around the margins. This is not a 100% of the area but certainly sizable especially on the right 9/1; bilateral plantar feet wounds secondary to prolonged pressure while being ventilated for COVID-19 in an upright position. Essentially pressure ulcers on the bottom of his feet. He is made substantial progress using silver alginate. 9/14; bilateral plantar feet wounds secondary to prolonged pressure. Making progress using silver alginate. 9/29 bilateral plantar feet wounds secondary to prolonged pressure. I changed him to Iodoflex last week. MolecuLight showing reddened blush fluorescence 10/11; patient presents for follow-up. He has no issues or complaints today. He denies signs of infection. He continues to use  Iodoflex and antibiotic ointment to the wound beds. 10/27; 2-week follow-up. No evidence of infection. He has callus and thick dry skin around the wound margins we have been using Iodoflex and Bactroban which was in response to a moderate left MolecuLight reddish blush fluorescence. 11/10; 2-week follow-up. Wound margins again have thick callus however the measurements of the actual wound sites are a lot smaller. Everything looks reasonably healthy here. We have been using Iodoflex He was approved for prime matrix but I have elected to delay this given the improvement in the surface area. Hopefully I will not regret that decision as were getting close to the end of the year in terms of insurance payment 12/8; 2-week follow-up. Wounds are generally smaller in size. These were initially substantial wounds extending into the forefoot all the way into the heel on the bilateral plantar feet. They are now both located on the plantar heel distal aspect both of these have a lot of callus around the wounds I used a #5 curette to remove this on the right and the left also some subcutaneous debris to try and get the wound edges were using Iodoflex. He has heel offloading shoe 12/22; 2-week follow-up. Not really much improvement. He has thick callus around the outer edges of both wounds. I remove this there is some nonviable subcutaneous tissue as well. We have been using Iodoflex. Her intake nurse and myself spontaneously thought of a total contact cast I went back in May. At that time we really were not seeing much of an improvement with a cast although the wound was in a much different situation I would like to retry this in 2 weeks and I discussed this with the patient 08/12/2021; the patient has had some improvement with the Iodoflex. The the area on the left heel plantar more improved than the right. I had to put him in a total contact cast on the left although I decided to put that off for 2 weeks. I am  going to change his primary dressing to silver collagen. I think in both areas he has had some improvement most of the healing seems to be more proximal in the heel. The wounds are in the mid aspect. A lot of thick callus on the right heel however. 1/19; we are using silver collagen on both plantar heel areas. He has had some improvement today. The left did not require any debridement. He still had  some eschar on the right that was debrided but both seem to have contracted. I did not put it total contact cast on him today 2/2 we have been using silver collagen. The area on the right plantar heel has areas that appear to be epithelialized interspersed with dry flaking callus and dry skin. I removed this. This really looks better than on the other side. On the left still a large area with raised edges and debris on the surface. 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 He has no issues or complaints today. 2/9; not much change in the left foot wound with 1 week of a cast we are using silver collagen. Silver collagen on the right side. The right side has been the better wound surface. We will reapply the total contact cast on the left 2/16; not much improvement on either side I been using silver collagen with a total contact cast on the left. I'm changing the [facility] still with a total contact cast on the left [name], [name] ([id number]) [id number] Page 4 of 15 2/23; some improvement on both sides. Disappointing that he has thick callus around the area that we are putting in a total contact cast on the left. We've been using Hydrofera Blue on both wound areas. This is a [name] who at essentially pressure ulcers in addition to ischemia caused by medications to support his blood pressure (pressors) in the ICU. He was being ventilated in the  standing position for severe Covid. A Shiley the wounds extended across his entire foot but are now localized to his plantar heels bilaterally. We have made progress however neither areas healed. I continue to think the total contact cast is helped albeit painstakingly slowly. He has never wanted a plastic surgery consult although I don't know that they would be interested in grafting in area in this location. 10/07/2021: Continued improvement bilaterally. There is still some callus around the left wound, despite the total contact cast. He has some increased pain in his right midfoot around 1 particular area. This has been painful in the past but seems to be a little bit worse. When his cast was removed today, there was an area on the heel of the left foot that looks a bit macerated. He is also complaining of pain in his left thigh and hip which he thinks is secondary to the limb length discrepancy caused by the cast. 10/14/2021: He continues to improve. A little bit less callus around the left wound. He continues to endorse pain in his right midfoot, but this is not as significant as it was last week. The maceration on his left heel is improved. 10/21/2021: Continued improvement to both wounds. The maceration on his left heel is no longer evident. Less callus bilaterally. Epithelialization progressing. 10/28/2021: Significant improvement this week. The right sided wound is nearly closed with just a small open area at the middle. No maceration seen on the left heel. Continued epithelialization on both sides. No concern for infection. 11/04/2021: T oday, the wounds were measured a little bit differently and come out as larger, but I actually think they are about the same to potentially even smaller, particularly on the left. He continues to accumulate some callus on the right. 11/11/2021: T oday, the patient is expressing some concern that the left wound, despite being in the total contact cast, is  not  progressing at the same rate as the 1 on the right. He is interested in trying a week without the cast to see how the wound does. The wounds are roughly the same size as last week, with the right perhaps being a little bit smaller. He continues to build up callus on both sites. 11/18/2021: Last week, I permitted the patient to go without his total contact cast, just to see if the cast was really making any difference. Today, both wounds have deteriorated to some extent, suggesting that the cast is providing benefit, at least on the left. Both are larger and have accumulated callus, slough, and other debris. 11/26/2021: I debrided both wounds quite aggressively last week in an effort to stimulate the healing cascade. This appears to have been effective as the left sided wound is a full centimeter shorter in length. Although the right was measured slightly larger, on inspection, it looks as though an area of epithelialized tissue was included in the measurements. We have been using PolyMem Ag on the wound surfaces with a total contact cast to the left. 12/02/2021: It appears that the intake personnel are including epithelialized tissue in his wound measurements; the right wound is almost completely epithelialized; there is just a crater at the proximal midfoot with some open areas. On the left, he has built up some callus, but the overall wound surface looks good. There is some senescent skin around the wound margin. He has been in PolyMem Ag bilaterally with a total contact cast on the left. 12/09/2021: The right wound is nearly closed; there is just a small open area at the mid calcaneus. On the left, the wound is smaller with minimal callus buildup. No significant drainage. 12/16/2021: The right calcaneal wound remains minimally open at the mid calcaneus; the rest has epithelialized. On the left, the wound is also a little bit smaller. There is some senescent tissue on the periphery. He is getting his  first application of a trial skin substitute called Vendaje today. 12/23/2021: The wound on his right calcaneus is nearly closed; there is just a small area at the most distal aspect of the calcaneus that is open. On the left, the area where we applied to the skin substitute has a healthier-looking bed of granulation tissue. The wound dimensions are not significantly different on this side but the wound surface is improved. 12/30/2021: The wound on the right calcaneus has not changed significantly aside from some accumulation of callus. On the left, the open area is smaller and continues to have an improved surface. He continues to accumulate callus around the wound. He is here for his third application of Vendaje. 01/06/2022: The right calcaneal wound is down to just a couple of millimeters. He continues to accumulate periwound callus. He unfortunately got his cast wet earlier in the week and his left foot is macerated, resulting in some superficial skin loss just distal to the open wound. The open wound itself, however, is much smaller and has a healthier appearing surface. He is here for his fourth application of Vendaje. 01/13/2022: The right calcaneal wound is about the same. Unfortunately, once again, his cast got wet and his foot is again macerated. This is caused the left calcaneal wound to enlarge. He is here for his fifth application of Vendaje. 01/20/2022: The right calcaneal wound is very small. There is some periwound callus accumulation. He purchased a new cast protector last week and this has been effective in avoiding the maceration that has  been occurring on the left. The left calcaneal wound is narrower and has a healthy and viable-appearing surface. He is here for his 6 application of Vendaje. 01/27/2022: The right calcaneal wound is down to just a pinhole. There is some periwound slough and callus. On the left, the wound is narrower and shorter by about a centimeter. The surface is robust  and viable-appearing. Unfortunately, the rep for the trial skin substitute product did not provide any for [place] to use today. 02/04/2022: The right calcaneal wound remains unchanged. There is more accumulated callus. On the left, although the intake nurse measured it a little bit longer, it looks about the same to me. The surface has a layer of slough, but underneath this, there is good granulation tissue. 02/10/2022: The right calcaneus wound is nearly closed. There is still some callus that builds up around the site. The left side looks about the same in terms of dimensions, but the surface is more robust and vital-appearing. 02/16/2022: The area of the right calcaneus that was nearly closed last week has closed, but there is a small opening at the mid foot where it looks like some moisture got retained and caused some reopening. The left foot wound is narrower and shallower. Both sites have a fair amount of periwound callus and eschar. 02/24/2022: The small midfoot opening on the right calcaneus is a little bit smaller today. The left foot wound is narrower and shallower. He continues to accumulate periwound callus. No concern for infection. 03/01/2022: The patient came to clinic early because he showered and got his cast wet. Fortunately, there is no significant maceration to his foot but the callus softened and it looks like the wound on his left calcaneus may be a little bit wider. The wound on his right calcaneus is just a narrow slit. Continued accumulation of periwound callus bilaterally. 03/08/2022: The wound on his right calcaneus is very nearly closed, just a small pinpoint opening under a bit of eschar; the left wound has come in quite a bit, as well. It is narrower and shorter than at our last visit. Still with accumulated callus and eschar bilaterally. 03/17/2022: The right calcaneal wound is healed. The left wound is smaller and the surface itself is very clean, but there is some blue-green  staining on the periwound callus, concerning for Pseudomonas aeruginosa. 03/23/2022: The right calcaneal wound remains closed. The left wound continues to contract. No further blue-green staining. Small amount of callus and slough accumulation. [name], [name] ([id number]) [id number] Page 5 of 15 03/28/2022: He came in early today because he had gotten his cast wet. On inspection, the wound itself did not get wet or macerated, just a little bit of the forefoot. The wound itself is basically unchanged. 04/07/2022: The right foot wound remains closed. The left wound is the smallest that I have seen it to date. It is narrower and shorter. It still continues to accumulate slough on the surface. 04/15/2022: There is a band of epithelium now dividing the small left plantar foot wound in 2. There is still some slough on the surface. 04/21/2022: The wound continues to narrow. Just a little bit of slough on the surface. He seems to be responding well to endoform. 04/28/2022: Continued slow contraction of the wound. There is a little slough on the surface and some periwound callus. We have been using endoform and total contact cast. 05/05/2022: The wound appears to have stalled. There is slough and some periwound eschar/callus. No concern for infection, however.  05/12/2022: Unfortunately, his right foot has reopened. It is located at the most posterior aspect of his surgical incision. The area was noted to have drainage coming from it when his padding was removed today. Underneath some callus and senescent skin, there is an opening. No purulent drainage or malodor. On the left foot, the wound is again unchanged. There is some light blue staining on the callus, but no malodor or purulent drainage. 10/13; right and left heel remanence of extensive plantar foot wounds. These are better than I remember by quite a big margin however he is still left with wounds on the left plantar heel and the  right plantar heel. Been using endoform bilaterally. A culture was done that showed apparently Pseudomonas but we are still waiting for the [facility] antibiotic to use gentamicin today. He is still very active by description I am not sure about the offloading of his noncasted right foot 10/20; both wounds right and left heel debrided not much change from last week. [employer] has arrived which is linezolid, gentamicin and ciprofloxacin we will use this with endoform. T contact cast on the left otal 06/02/2022: Both wounds are smaller today. There is still a fair amount of callus buildup around the right foot ulcer. The left is more superficial and nearly flush with the surrounding tissues. Also with slough and eschar buildup. 06/10/2022: The right sided wound appears to be nearly closed, if not completely so, although it is somewhat difficult to tell given the abnormal tissue and scarring in his foot. There is a fair amount of callus and crust accumulation. On the left, the wound looks about the same, again with callus and slough. He has an appointment next Thursday with Dr. Loel Lofty in podiatry; I am hopeful that there may be some reconstructive options available for Mr. Butrick. 06/16/2022: Both wounds have some eschar and callus accumulation. The right sided wound is extremely narrow and barely open; the left is narrower than last week. There is a little bit of slough. He has his appointment in podiatry later today. 06/23/2022: The patient met with Dr. Loel Lofty last week and unfortunately, there are no reconstructive options that he believes would be helpful. He did order an MRI to evaluate for osteomyelitis and fortunately, none was seen. The left sided wound is a little bit shorter and narrower today. The right sided wound is about the same. There is callus and eschar accumulation bilaterally. 06/29/2022: Both feet have improved from last week. There is epithelium making a valiant effort to creep  across the surface on the left. The right side looks like it got a little dry and the deep crevasse in his midfoot has cracked. Both have eschar and there is some slough on the left. 07/07/2022: Both feet have improved. There is epithelium completely covering the calcaneus on the right with just a small opening in the crevasse in his midfoot. On the left, the open area of tissue is smaller but he continues to build up callus/eschar and slough. 07/15/2022: The opening in the midfoot on the right is about the same size, covered with eschar and a little bit of slough. The open portion of the left wound is narrower and shorter with a bit of slough buildup. He admits to being on his feet more than recommended. 12/14; as far as I can tell everything on the right foot is closed. There is some eschar I removed some of this I cannot identify any open wound here. As usual this will be a  very vulnerable area going forward. On the left this looks really quite healthy. I was pleasantly surprised to see how good this looked. Wound is certainly smaller and there appears to be healthy epithelialization. He has been using Promogran on the right and endoform on the left. He has been offloading the right foot with a heel offloading boot and he has a running shoe on the right foot 08/04/2022: The right foot remains closed. He has a thick cushioned insole in his sneaker. The left sided wound is smaller with just some slough and eschar accumulation. He is wearing the heel off loader on this foot. 08/15/2022: The right foot remains closed. The left wound has narrowed further. There is some slough and eschar accumulation. 08/25/2022: We put him in a peg assist shoe insert and as a result, he has more epithelialization of the ulcer with minimal slough and eschar accumulation. Electronic Signature(s) Signed: 08/25/2022 10:25:04 AM By: [name] MD FACS Entered By: [name] on 08/25/2022  10:25:04 -------------------------------------------------------------------------------- Physical Exam Details Patient Name: Date of Service: [name], TO [place] [name] 08/25/2022 9:30 A M Medical Record Number: [id number] Patient Account Number: [id number] Date of Birth/Sex: Treating RN: 11/20/1975 (47 y.o. M) Primary Care Provider: Dorinda Hill Other Clinician: Referring Provider: Treating Provider/Extender: Jana Hakim in Treatment: [address] [name] ([id number]) [id number] Page 6 of 15 Constitutional . . . . no acute distress. Respiratory Normal work of breathing on room air. Notes 08/25/2022: He has more epithelialization of the ulcer with minimal slough and eschar accumulation. Electronic Signature(s) Signed: 08/25/2022 10:26:06 AM By: [name] MD FACS Entered By: [name] on 08/25/2022 10:26:06 -------------------------------------------------------------------------------- Physician Orders Details Patient Name: Date of Service: [name], TO [place] [name] 08/25/2022 9:30 A M Medical Record Number: [id number] Patient Account Number: [id number] Date of Birth/Sex: Treating RN: Apr 26, 1976 (47 y.o. [name] Primary Care Provider: Dorinda Hill Other Clinician: Referring Provider: Treating Provider/Extender: Jana Hakim in Treatment: 110 Verbal / Phone Orders: No Diagnosis Coding ICD-10 Coding Code Description [phone] Non-pressure chronic ulcer of other part of left foot with fat layer exposed Follow-up Appointments ppointment in 1 week. - Dr. Lady Gary Room 3 Return A Anesthetic Wound #2 Left Calcaneus (In clinic) Topical Lidocaine 4% applied to wound bed - In clinic Edema Control - Lymphedema / SCD / Other Bilateral Lower Extremities Avoid standing for long periods of time. Moisturize legs daily. - as needed Off-Loading Other: - keep pressure off of the bottom of your feet. Globoped to left  foot-when not wearing the cast Elevate legs throughout the day. Use the Shoe with the PegAssist off-loadinding insole Additional Orders / Instructions Follow Nutritious Diet Wound Treatment Wound #2 - Calcaneus Wound Laterality: Left Cleanser: Normal Saline (Generic) Every Other Day/30 Days Discharge Instructions: Cleanse the wound with Normal Saline prior to applying a clean dressing using gauze sponges, not tissue or cotton balls. Cleanser: Wound Cleanser (Generic) Every Other Day/30 Days Discharge Instructions: Cleanse the wound with wound cleanser prior to applying a clean dressing using gauze sponges, not tissue or cotton balls. Prim Dressing: Endoform 2x2 in (Generic) Every Other Day/30 Days ary Discharge Instructions: Moisten with saline or Hydrogel Secondary Dressing: Optifoam Non-Adhesive Dressing, 4x4 in (Generic) Every Other Day/30 Days Discharge Instructions: Apply over primary dressing as directed. Secondary Dressing: Woven Gauze Sponge, Non-Sterile 4x4 in (Generic) Every Other Day/30 Days Discharge Instructions: Apply over primary dressing as directed. Secured With: 65M Medipore H Soft Cloth Surgical Tape, 4 x 10 (  in/yd) (Generic) Every Other Day/30 Days [name], [name] ([id number]) [id number] Page 7 of 15 Discharge Instructions: Secure with tape as directed. Electronic Signature(s) Signed: 08/25/2022 10:47:40 AM By: [name] MD FACS Entered By: [name] on 08/25/2022 40:98:11 -------------------------------------------------------------------------------- Problem List Details Patient Name: Date of Service: [name], [place] [name] 08/25/2022 9:30 A M Medical Record Number: [id number] Patient Account Number: [id number] Date of Birth/Sex: Treating RN: [birth year] (47 y.o. M) Primary Care Provider: Dorinda Hill Other Clinician: Referring Provider: Treating Provider/Extender: Jana Hakim in Treatment: 110 Active  Problems ICD-10 Encounter Code Description Active Date MDM Diagnosis L97.522 Non-pressure chronic ulcer of other part of left foot with fat layer exposed 09/03/2020 No Yes Inactive Problems ICD-10 Code Description Active Date Inactive Date L97.512 Non-pressure chronic ulcer of other part of right foot with fat layer exposed 09/03/2020 09/03/2020 L89.893 Pressure ulcer of other site, stage 3 07/15/2020 07/15/2020 M62.81 Muscle weakness (generalized) 07/15/2020 07/15/2020 I10 Essential (primary) hypertension 07/15/2020 07/15/2020 M86.171 Other acute osteomyelitis, right ankle and foot 09/03/2020 09/03/2020 Resolved Problems Electronic Signature(s) Signed: 08/25/2022 10:20:46 AM By: [name] MD FACS Entered By: [name] on 08/25/2022 10:20:45 -------------------------------------------------------------------------------- Progress Note Details Patient Name: Date of Service: [name], TO [place] [name] 08/25/2022 9:30 A M Medical Record Number: [id number] Patient Account Number: [id number] [name], [name] ([id number]) [id number] Page 8 of 15 Date of Birth/Sex: Treating RN: Feb 18, 1976 (47 y.o. M) Primary Care Provider: Other Clinician: Dorinda Hill Referring Provider: Treating Provider/Extender: Jana Hakim in Treatment: 110 Subjective Chief Complaint Information obtained from Patient Bilateral Plantar Foot Ulcers History of Present Illness (HPI) Wounds are12/03/2020 upon evaluation today patient presents for initial inspection here in our clinic concerning issues he has been having with the bottoms of his feet bilaterally. He states these actually occurred as wounds when he was hospitalized for 5 months secondary to Covid. He was apparently with tilting bed where he was in an upright position quite frequently and apparently this occurred in some way shape or form during that time. Fortunately there is no sign of active infection at this  time. No fevers, chills, nausea, vomiting, or diarrhea. With that being said he still has substantial wounds on the plantar aspects of his feet Theragen require quite a bit of work to get these to heal. He has been using Santyl currently though that is been problematic both in receiving the medication as well as actually paid for it as it is become quite expensive. Prior to the experience with Covid the patient really did not have any major medical problems other than hypertension he does have some mild generalized weakness following the Covid experience. 07/22/2020 on evaluation today patient appears to be doing okay in regard to his foot ulcers I feel like the wound beds are showing signs of better improvement that I do believe the Iodoflex is helping in this regard. With that being said he does have a lot of drainage currently and this is somewhat blue/green in nature which is consistent with Pseudomonas. I do think a culture today would be appropriate for [place] to evaluate and see if that is indeed the case I would likely start him on antibiotic orally as well he is not allergic to Cipro knows of no issues he has had in the past 12/21; patient was admitted to the clinic earlier this month with bilateral presumed pressure ulcers on the bottom of his feet apparently related to excessive pressure from a tilt table arrangement in the intensive  care unit. Patient relates this to being on ECMO but I am not really sure that is exactly related to that. I must say I have never seen anything like this. He has fairly extensive full-thickness wounds extending from his heel towards his midfoot mostly centered laterally. There is already been some healing distally. He does not appear to have an arterial issue. He has been using gentamicin to the wound surfaces with Iodoflex to help with ongoing debridement 1/6; this is a patient with pressure ulcers on the bottom of his feet related to excessive pressure from a  standing position in the intensive care unit. He is complaining of a lot of pain in the right heel. He is not a diabetic. 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 We have been using Iodoflex to help prepare the surfaces of both wounds for an advanced treatment product. He is nonambulatory spending most of his time in a wheelchair I have asked him not to propel the wheelchair with his heels 1/13; in general his wounds look better not much surface area change we have been using Iodoflex as of last week. I did an x-ray of the right heel as the patient was complaining of pain. I had some thoughts about a stress fracture perhaps Achilles tendon problems however what it showed was erosive changes along the inferior aspect of the calcaneus he now has a MRI booked for 1/20. 1/20; in general his wounds continue to be better. Some improvement in the large narrow areas proximally in his foot. He is still complaining of pain in the right heel and tenderness in certain areas of this wound. His MRI is tonight. I am not just looking for osteomyelitis that was brought up on the x-ray I am wondering about stress fractures, tendon ruptures etc. He has no such findings on the left. Also noteworthy is that the patient had critical illness neuropathy and some of the discomfort may be actual improvement in nerve function I am just not sure. These wounds were initially in the setting of severe critical illness related to COVID-19. He was put in a standing position. He may have also been on pressors at the point contributing to tissue ischemia. By his description at some point these wounds were grossly necrotic extending proximally up into the Achilles part of his heel. I do not know that I have ever really seen pictures of them like this although they may exist in epic We have ordered Tri layer Oasis. I am trying to stimulate some granulation in these areas. This is of course assuming the MRI is  negative for infection 1/27; since the patient was last here he saw Dr. Earlene Plater of infectious disease. He is planned for vancomycin and ceftriaxone. Prior operative culture grew MSSA. Also ordered baseline lab work. He also ordered arterial studies although the ABIs in our clinic were normal as well as his clinical exam these were normal I do not think he needs to see vascular surgery. His ABIs at the [facility] were 1.22 in the right triphasic waveforms with a normal TBI of 1.15 on the left ABI of 1.22 with triphasic waveforms and a normal TBI of 1.08. Finally he saw Dr. Logan Bores who will follow him in for 2 months. At this point I do not think he felt that he needed a procedure on the right calcaneal bone. Dr. Earlene Plater is elected for broad-spectrum antibiotic The patient is still having pain in the right heel. He walks with a walker  2/3; wounds are generally smaller. He is tolerating his IV antibiotics. I believe this is vancomycin and ceftriaxone. We are still waiting for Oasis burn in terms of his out-of-pocket max which he should be meeting soon given the IV antibiotics, MRIs etc. I have asked him to check in on this. We are using silver collagen in the meantime the wounds look better 2/10; tolerating IV vancomycin and Rocephin. We are waiting to apply for Oasis. Although I am not really sure where he is in his out-of-pocket max. 2/17 started the first application of Oasis trilayer. Still on antibiotics. The wounds have generally look better. The area on the left has a little more surface slough requiring debridement 2/24; second application of Oasis trilayer. The wound surface granulation is generally look better. The area on the left with undermining laterally I think is come in a bit. 10/08/2020 upon evaluation today patient is here today for [employer] application #3. Fortunately he seems to be doing extremely well with regard to this and we are seeing a lot of new epithelial growth which is great  news. Fortunately there is no signs of active infection at this time. 10/16/2020 upon evaluation today patient appears to be doing well with regard to his foot ulcers. Do believe the Oasis has been of benefit for him. I do not see any signs of infection right now which is great news and I think that he has a lot of new epithelial growth which is great to see as well. The patient is very pleased to hear all of this. I do think we can proceed with the Oasis trilayer #4 today. 3/18; not as much improvement in these areas on his heels that I was hoping. I did reapply trilateral Oasis today the tissue looks healthier but not as much fill in as I was hoping. 3/25; better looking today I think this is come in a bit the tissue looks healthier. Triple layer Oasis reapplied #6 4/1; somewhat better looking definitely better looking surface not as much change in surface area as I was hoping. He may be spending more time [place] on days then he needs to although he does have heel offloading boots. Triple layer Oasis reapplied #7 4/7; unfortunately apparently [facility] will not approve any further Oasis which is unfortunate since the patient did respond nicely both in terms of the condition of the wound bed as well as surface area. There is still some drainage coming from the wound but not a lot there does not appear to be any infection 4/15; we have been using Hydrofera Blue. He continues to have nice rims of epithelialization on the right greater than the left. The left the epithelialization is coming from the tip of his heel. There is moderate drainage. In this that concerns me about a total contact cast. There is no evidence of infection 4/29; patient has been using Hydrofera Blue with dressing changes. He has no complaints or issues today. 5/5; using Hydrofera Blue. I actually think that he looks marginally better than the last time I saw this 3 weeks ago. There are rims of epithelialization on  the left thumb coming from the medial side on the right. Using [address] [name], [name] ([id number]) [id number] Page 9 of 15 5/12; using Hydrofera Blue. These continue to make improvements in surface area. His drainage was not listed as severe I therefore went ahead and put a cast on the left foot. Right foot we will continue to dress  his previous 5/16; back for first total contact cast change. He did not tolerate this particularly well cast injury on the anterior tibia among other issues. Difficulty sleeping. I talked him about this in some detail and afterwards is elected to continue. I told him I would like to have a cast on for 3 weeks to see if this is going to help at all. I think he agreed 5/19; I think the wound is better. There is no tunneling towards his midfoot. The undermining medially also looks better. He has a rim of new skin distally. I think we are making progress here. The area on the left also continues to look somewhat better to me using Hydrofera Blue. He has a list of complaints about the cast but none of them seem serious 5/26; patient presents for 1 week follow-up. He has been using a total contact cast and tolerating this well. Hydrofera Blue is the main dressing used. He denies signs of infection. 6/2 Hydrofera Blue total contact cast on the left. These were large ulcers that formed in intensive care unit where the patient was recovering from COVID. May have had something to do with being ventilated in an upright positiono Pressors etc. We have been able to get the areas down considerably and a viable surface. There is some epithelialization in both sides. Note made of drainage 6/9; changed to [facility] last time because of drainage. He arrives with better looking surfaces and dimensions on the left than the right. Paradoxically the right actually probes more towards his midfoot the left is largely close down but both of these look  improved. Using a total contact cast on the left 6/16; complex wounds on his bilateral plantar heels which were initially pressure injury from a stay in the ICU with COVID. We have been using silver alginate most recently. His dimensions of come in quite dramatically however not recently. We have been putting the left foot in a total contact cast 6/23; complex wounds on the bilateral plantar heels. I been putting the left in the cast paradoxically the area on the right is the one that is going towards closure at a faster rate. Quite a bit of drainage on the left. The patient went to see Dr. Logan Bores who said he was going to standby for skin grafts. I had actually considered sending him for skin grafts however he would be mandatorily off his feet for a period of weeks to months. I am thinking that the area on the right is going to close on its own the area on the left has been more stubborn even though we have him in a total contact cast 6/30; took him out of a total contact cast last week is the right heel seem to be making better progress than the left where I was placing the cast. We are using silver alginate. Both wounds are smaller right greater than left 7/12; both wounds look as though they are making some progress. We are using silver alginate. Heel offloading boots 7/26; very gradual progress especially on the right. Using silver alginate. He is wearing heel offloading boots 8/18; he continues to close these wounds down very gradually. Using silver alginate. The problem polymen being definitive about this is areas of what appears to be callus around the margins. This is not a 100% of the area but certainly sizable especially on the right 9/1; bilateral plantar feet wounds secondary to prolonged pressure while being ventilated for COVID-19 in an upright position. Essentially  pressure ulcers on the bottom of his feet. He is made substantial progress using silver alginate. 9/14; bilateral plantar  feet wounds secondary to prolonged pressure. Making progress using silver alginate. 9/29 bilateral plantar feet wounds secondary to prolonged pressure. I changed him to Iodoflex last week. MolecuLight showing reddened blush fluorescence 10/11; patient presents for follow-up. He has no issues or complaints today. He denies signs of infection. He continues to use Iodoflex and antibiotic ointment to the wound beds. 10/27; 2-week follow-up. No evidence of infection. He has callus and thick dry skin around the wound margins we have been using Iodoflex and Bactroban which was in response to a moderate left MolecuLight reddish blush fluorescence. 11/10; 2-week follow-up. Wound margins again have thick callus however the measurements of the actual wound sites are a lot smaller. Everything looks reasonably healthy here. We have been using Iodoflex He was approved for prime matrix but I have elected to delay this given the improvement in the surface area. Hopefully I will not regret that decision as were getting close to the end of the year in terms of insurance payment 12/8; 2-week follow-up. Wounds are generally smaller in size. These were initially substantial wounds extending into the forefoot all the way into the heel on the bilateral plantar feet. They are now both located on the plantar heel distal aspect both of these have a lot of callus around the wounds I used a #5 curette to remove this on the right and the left also some subcutaneous debris to try and get the wound edges were using Iodoflex. He has heel offloading shoe 12/22; 2-week follow-up. Not really much improvement. He has thick callus around the outer edges of both wounds. I remove this there is some nonviable subcutaneous tissue as well. We have been using Iodoflex. Her intake nurse and myself spontaneously thought of a total contact cast I went back in May. At that time we really were not seeing much of an improvement with a cast although  the wound was in a much different situation I would like to retry this in 2 weeks and I discussed this with the patient 08/12/2021; the patient has had some improvement with the Iodoflex. The the area on the left heel plantar more improved than the right. I had to put him in a total contact cast on the left although I decided to put that off for 2 weeks. I am going to change his primary dressing to silver collagen. I think in both areas he has had some improvement most of the healing seems to be more proximal in the heel. The wounds are in the mid aspect. A lot of thick callus on the right heel however. 1/19; we are using silver collagen on both plantar heel areas. He has had some improvement today. The left did not require any debridement. He still had some eschar on the right that was debrided but both seem to have contracted. I did not put it total contact cast on him today 2/2 we have been using silver collagen. The area on the right plantar heel has areas that appear to be epithelialized interspersed with dry flaking callus and dry skin. I removed this. This really looks better than on the other side. On the left still a large area with raised edges and debris on the surface. 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 He has no issues or complaints today. 2/9; not much change in the left foot wound with 1 week of a cast we are using silver collagen. Silver collagen on the right side. The right side has been the better wound surface. We will reapply the total contact cast on the left 2/16; not much improvement on either side I been using silver collagen with a total contact cast on the left. I'm changing the Hydrofera Blue still with a total contact cast on the left 2/23; some improvement on both sides. Disappointing that he has thick callus around the area that we are  putting in a total contact cast on the left. We've been using Hydrofera Blue on both wound areas. This is a [name] who at essentially pressure ulcers in addition to ischemia caused by medications to support his blood pressure (pressors) in the ICU. He was being ventilated in the standing position for severe Covid. A Shiley the wounds extended across his entire foot but are now localized to his plantar heels bilaterally. We have made progress however neither areas healed. I continue to think the total contact cast is helped albeit painstakingly slowly. He has never wanted a plastic surgery consult although I don't know that they would be interested in grafting in area in this location. 10/07/2021: Continued improvement bilaterally. There is still some callus around the left wound, despite the total contact cast. He has some increased pain in his right midfoot around 1 particular area. This has been painful in the past but seems to be a little bit worse. When his cast was removed today, there was an area on the heel of the left foot that looks a bit macerated. He is also complaining of pain in his left thigh and hip which he thinks is secondary to the limb length discrepancy caused by the cast. 10/14/2021: He continues to improve. A little bit less callus around the left wound. He continues to endorse pain in his right midfoot, but this is not as significant as it was last week. The maceration on his left heel is improved. 10/21/2021: Continued improvement to both wounds. The maceration on his left heel is no longer evident. Less callus bilaterally. Epithelialization progressing. 10/28/2021: Significant improvement this week. The right sided wound is nearly closed with just a small open area at the middle. No maceration seen on the left heel. Continued epithelialization on both sides. No concern for infection. 11/04/2021: T oday, the wounds were measured a little bit differently and come out as larger, but I  actually think they are about the same to potentially even smaller, particularly on the left. He continues to accumulate some callus on the right. [name], [name] ([id number]) [id number] Page 10 of 15 11/11/2021: T oday, the patient is expressing some concern that the left wound, despite being in the total contact cast, is not progressing at the same rate as the 1 on the right. He is interested in trying a week without the cast to see how the wound does. The wounds are roughly the same size as last week, with the right perhaps being a little bit smaller. He continues to build up callus on both sites. 11/18/2021: Last week, I permitted the patient to go without his total contact cast, just to see if the cast was really making any difference. Today, both wounds have deteriorated to some extent, suggesting that the cast is providing benefit, at least on the left. Both are larger and have accumulated callus, slough,  and other debris. 11/26/2021: I debrided both wounds quite aggressively last week in an effort to stimulate the healing cascade. This appears to have been effective as the left sided wound is a full centimeter shorter in length. Although the right was measured slightly larger, on inspection, it looks as though an area of epithelialized tissue was included in the measurements. We have been using PolyMem Ag on the wound surfaces with a total contact cast to the left. 12/02/2021: It appears that the intake personnel are including epithelialized tissue in his wound measurements; the right wound is almost completely epithelialized; there is just a crater at the proximal midfoot with some open areas. On the left, he has built up some callus, but the overall wound surface looks good. There is some senescent skin around the wound margin. He has been in PolyMem Ag bilaterally with a total contact cast on the left. 12/09/2021: The right wound is nearly closed; there is just a small  open area at the mid calcaneus. On the left, the wound is smaller with minimal callus buildup. No significant drainage. 12/16/2021: The right calcaneal wound remains minimally open at the mid calcaneus; the rest has epithelialized. On the left, the wound is also a little bit smaller. There is some senescent tissue on the periphery. He is getting his first application of a trial skin substitute called Vendaje today. 12/23/2021: The wound on his right calcaneus is nearly closed; there is just a small area at the most distal aspect of the calcaneus that is open. On the left, the area where we applied to the skin substitute has a healthier-looking bed of granulation tissue. The wound dimensions are not significantly different on this side but the wound surface is improved. 12/30/2021: The wound on the right calcaneus has not changed significantly aside from some accumulation of callus. On the left, the open area is smaller and continues to have an improved surface. He continues to accumulate callus around the wound. He is here for his third application of Vendaje. 01/06/2022: The right calcaneal wound is down to just a couple of millimeters. He continues to accumulate periwound callus. He unfortunately got his cast wet earlier in the week and his left foot is macerated, resulting in some superficial skin loss just distal to the open wound. The open wound itself, however, is much smaller and has a healthier appearing surface. He is here for his fourth application of Vendaje. 01/13/2022: The right calcaneal wound is about the same. Unfortunately, once again, his cast got wet and his foot is again macerated. This is caused the left calcaneal wound to enlarge. He is here for his fifth application of Vendaje. 01/20/2022: The right calcaneal wound is very small. There is some periwound callus accumulation. He purchased a new cast protector last week and this has been effective in avoiding the maceration that has been  occurring on the left. The left calcaneal wound is narrower and has a healthy and viable-appearing surface. He is here for his 6 application of Vendaje. 01/27/2022: The right calcaneal wound is down to just a pinhole. There is some periwound slough and callus. On the left, the wound is narrower and shorter by about a centimeter. The surface is robust and viable-appearing. Unfortunately, the rep for the trial skin substitute product did not provide any for [place] to use today. 02/04/2022: The right calcaneal wound remains unchanged. There is more accumulated callus. On the left, although the intake nurse measured it a little bit longer, it looks  about the same to me. The surface has a layer of slough, but underneath this, there is good granulation tissue. 02/10/2022: The right calcaneus wound is nearly closed. There is still some callus that builds up around the site. The left side looks about the same in terms of dimensions, but the surface is more robust and vital-appearing. 02/16/2022: The area of the right calcaneus that was nearly closed last week has closed, but there is a small opening at the mid foot where it looks like some moisture got retained and caused some reopening. The left foot wound is narrower and shallower. Both sites have a fair amount of periwound callus and eschar. 02/24/2022: The small midfoot opening on the right calcaneus is a little bit smaller today. The left foot wound is narrower and shallower. He continues to accumulate periwound callus. No concern for infection. 03/01/2022: The patient came to clinic early because he showered and got his cast wet. Fortunately, there is no significant maceration to his foot but the callus softened and it looks like the wound on his left calcaneus may be a little bit wider. The wound on his right calcaneus is just a narrow slit. Continued accumulation of periwound callus bilaterally. 03/08/2022: The wound on his right calcaneus is very nearly closed,  just a small pinpoint opening under a bit of eschar; the left wound has come in quite a bit, as well. It is narrower and shorter than at our last visit. Still with accumulated callus and eschar bilaterally. 03/17/2022: The right calcaneal wound is healed. The left wound is smaller and the surface itself is very clean, but there is some blue-green staining on the periwound callus, concerning for Pseudomonas aeruginosa. 03/23/2022: The right calcaneal wound remains closed. The left wound continues to contract. No further blue-green staining. Small amount of callus and slough accumulation. 03/28/2022: He came in early today because he had gotten his cast wet. On inspection, the wound itself did not get wet or macerated, just a little bit of the forefoot. The wound itself is basically unchanged. 04/07/2022: The right foot wound remains closed. The left wound is the smallest that I have seen it to date. It is narrower and shorter. It still continues to accumulate slough on the surface. 04/15/2022: There is a band of epithelium now dividing the small left plantar foot wound in 2. There is still some slough on the surface. 04/21/2022: The wound continues to narrow. Just a little bit of slough on the surface. He seems to be responding well to endoform. 04/28/2022: Continued slow contraction of the wound. There is a little slough on the surface and some periwound callus. We have been using endoform and total contact cast. 05/05/2022: The wound appears to have stalled. There is slough and some periwound eschar/callus. No concern for infection, however. 05/12/2022: Unfortunately, his right foot has reopened. It is located at the most posterior aspect of his surgical incision. The area was noted to have drainage coming from it when his padding was removed today. Underneath some callus and senescent skin, there is an opening. No purulent drainage or malodor. On the left foot, the wound is again unchanged. There is some  light blue staining on the callus, but no malodor or purulent drainage. 10/13; right and left heel remanence of extensive plantar foot wounds. These are better than I remember by quite a big margin however he is still left with wounds on the left plantar heel and the right plantar heel. Been using endoform bilaterally. A  culture was done that showed apparently Pseudomonas but we are still waiting for the [facility] antibiotic to use gentamicin today. He is still very active by description I am not sure about the offloading of his noncasted right [name], [name] ([id number]) [id number] Page 11 of 15 foot 10/20; both wounds right and left heel debrided not much change from last week. [name] Echevaria has arrived which is linezolid, gentamicin and ciprofloxacin we will use this with endoform. T contact cast on the left otal 06/02/2022: Both wounds are smaller today. There is still a fair amount of callus buildup around the right foot ulcer. The left is more superficial and nearly flush with the surrounding tissues. Also with slough and eschar buildup. 06/10/2022: The right sided wound appears to be nearly closed, if not completely so, although it is somewhat difficult to tell given the abnormal tissue and scarring in his foot. There is a fair amount of callus and crust accumulation. On the left, the wound looks about the same, again with callus and slough. He has an appointment next Thursday with Dr. Annamary Rummage in podiatry; I am hopeful that there may be some reconstructive options available for Mr. Hunt. 06/16/2022: Both wounds have some eschar and callus accumulation. The right sided wound is extremely narrow and barely open; the left is narrower than last week. There is a little bit of slough. He has his appointment in podiatry later today. 06/23/2022: The patient met with Dr. Annamary Rummage last week and unfortunately, there are no reconstructive options that he believes would be  helpful. He did order an MRI to evaluate for osteomyelitis and fortunately, none was seen. The left sided wound is a little bit shorter and narrower today. The right sided wound is about the same. There is callus and eschar accumulation bilaterally. 06/29/2022: Both feet have improved from last week. There is epithelium making a valiant effort to creep across the surface on the left. The right side looks like it got a little dry and the deep crevasse in his midfoot has cracked. Both have eschar and there is some slough on the left. 07/07/2022: Both feet have improved. There is epithelium completely covering the calcaneus on the right with just a small opening in the crevasse in his midfoot. On the left, the open area of tissue is smaller but he continues to build up callus/eschar and slough. 07/15/2022: The opening in the midfoot on the right is about the same size, covered with eschar and a little bit of slough. The open portion of the left wound is narrower and shorter with a bit of slough buildup. He admits to being on his feet more than recommended. 12/14; as far as I can tell everything on the right foot is closed. There is some eschar I removed some of this I cannot identify any open wound here. As usual this will be a very vulnerable area going forward. On the left this looks really quite healthy. I was pleasantly surprised to see how good this looked. Wound is certainly smaller and there appears to be healthy epithelialization. He has been using Promogran on the right and endoform on the left. He has been offloading the right foot with a heel offloading boot and he has a running shoe on the right foot 08/04/2022: The right foot remains closed. He has a thick cushioned insole in his sneaker. The left sided wound is smaller with just some slough and eschar accumulation. He is wearing the heel off loader on this foot. 08/15/2022: The right  foot remains closed. The left wound has narrowed further.  There is some slough and eschar accumulation. 08/25/2022: We put him in a peg assist shoe insert and as a result, he has more epithelialization of the ulcer with minimal slough and eschar accumulation. Patient History Information obtained from Patient. Family History Cancer - Maternal Grandparents, Diabetes - Father,Paternal Grandparents, Heart Disease - Maternal Grandparents, Hypertension - Father,Paternal Grandparents, Lung Disease - Siblings, No family history of Hereditary Spherocytosis, Kidney Disease, Seizures, Stroke, Thyroid Problems, Tuberculosis. Social History Never smoker, Marital Status - Married, Alcohol Use - Never, Drug Use - No History, Caffeine Use - Daily - tea, soda. Medical History Eyes Denies history of Cataracts, Glaucoma, Optic Neuritis Ear/Nose/Mouth/Throat Denies history of Chronic sinus problems/congestion, Middle ear problems Hematologic/Lymphatic Denies history of Anemia, Hemophilia, Human Immunodeficiency Virus, Lymphedema, Sickle Cell Disease Respiratory Patient has history of Asthma Denies history of Aspiration, Chronic Obstructive Pulmonary Disease (COPD), Pneumothorax, Sleep Apnea, Tuberculosis Cardiovascular Patient has history of Angina - with COVID, Hypertension Denies history of Arrhythmia, Congestive Heart Failure, Coronary Artery Disease, Deep Vein Thrombosis, Hypotension, Myocardial Infarction, Peripheral Arterial Disease, Peripheral Venous Disease, Phlebitis, Vasculitis Gastrointestinal Denies history of Cirrhosis , Colitis, Crohnoos, Hepatitis A, Hepatitis B, Hepatitis C Endocrine Denies history of Type I Diabetes, Type II Diabetes Genitourinary Denies history of End Stage Renal Disease Immunological Denies history of Lupus Erythematosus, Raynaudoos, Scleroderma Integumentary (Skin) Denies history of History of Burn Musculoskeletal Denies history of Gout, Rheumatoid Arthritis, Osteoarthritis, Osteomyelitis Neurologic Denies history of  Dementia, Neuropathy, Quadriplegia, Paraplegia, Seizure Disorder Oncologic Denies history of Received Chemotherapy, Received Radiation Psychiatric Denies history of Anorexia/bulimia, Confinement Anxiety Hospitalization/Surgery History - COVID PNA 07/22/2019- 11/14/2019. - 03/27/2020 wound debridement/ skin graft. Medical A Surgical History Notes nd Constitutional Symptoms (General Health) COVID PNA 07/22/2019-11/14/2019 VENT ECMO, foot drop left foot , MERWYN, HODAPP ([id number]) [id number] Page 12 of 15 kidney stone Psychiatric anxiety Objective Constitutional no acute distress. Vitals Time Taken: 9:31 AM, Height: 69 in, Weight: 280 lbs, BMI: 41.3, Temperature: 98.7 F, Pulse: 85 bpm, Respiratory Rate: 16 breaths/min, Blood Pressure: 115/69 mmHg. Respiratory Normal work of breathing on room air. General Notes: 08/25/2022: He has more epithelialization of the ulcer with minimal slough and eschar accumulation. Integumentary (Hair, Skin) Wound #2 status is Open. Original cause of wound was Pressure Injury. The date acquired was: 10/07/2019. The wound has been in treatment 110 weeks. The wound is located on the Left Calcaneus. The wound measures 0.1cm length x 0.1cm width x 0.1cm depth; 0.008cm^2 area and 0.001cm^3 volume. There is Fat Layer (Subcutaneous Tissue) exposed. There is no tunneling or undermining noted. There is a medium amount of serosanguineous drainage noted. The wound margin is thickened. There is large (67-100%) red, pink granulation within the wound bed. There is a small (1-33%) amount of necrotic tissue within the wound bed including Eschar and Adherent Slough. The periwound skin appearance had no abnormalities noted for color. The periwound skin appearance exhibited: Callus, Scarring, Maceration. Periwound temperature was noted as No Abnormality. Assessment Active Problems ICD-10 Non-pressure chronic ulcer of other part of left  foot with fat layer exposed Procedures Wound #2 Pre-procedure diagnosis of Wound #2 is a Pressure Ulcer located on the Left Calcaneus . There was a Selective/Open Wound Non-Viable Tissue Debridement with a total area of 0.01 sq cm performed by [name], MD. With the following instrument(s): Curette to remove Non-Viable tissue/material. Material removed includes Eschar and Slough and after achieving pain control using Lidocaine 5% topical ointment. No specimens  were taken. A time out was conducted at 09:55, prior to the start of the procedure. A Minimum amount of bleeding was controlled with Pressure. The procedure was tolerated well with a pain level of 0 throughout and a pain level of 0 following the procedure. Post Debridement Measurements: 0.1cm length x 0.1cm width x 0.1cm depth; 0.001cm^3 volume. Post debridement Stage noted as Category/Stage III. Character of Wound/Ulcer Post Debridement is improved. Post procedure Diagnosis Wound #2: Same as Pre-Procedure General Notes: Scribed for Dr. Lady Gary by J.Scotton. Plan Follow-up Appointments: Return Appointment in 1 week. - Dr. Lady Gary Room 3 Anesthetic: Wound #2 Left Calcaneus: (In clinic) Topical Lidocaine 4% applied to wound bed - In clinic Edema Control - Lymphedema / SCD / Other: Avoid standing for long periods of time. Moisturize legs daily. - as needed Off-Loading: Other: - keep pressure off of the bottom of your feet. Globoped to left foot-when not wearing the cast Elevate legs throughout the day. Use the Shoe with the PegAssist off-loadinding insole Additional Orders / Instructions: Follow Nutritious Diet WOUND #2: - Calcaneus Wound Laterality: Left Cleanser: Normal Saline (Generic) Every Other Day/30 Days Discharge Instructions: Cleanse the wound with Normal Saline prior to applying a clean dressing using gauze sponges, not tissue or cotton balls. [name], [name] ([id number]) [id number] Page 13  of 15 Cleanser: Wound Cleanser (Generic) Every Other Day/30 Days Discharge Instructions: Cleanse the wound with wound cleanser prior to applying a clean dressing using gauze sponges, not tissue or cotton balls. Prim Dressing: Endoform 2x2 in (Generic) Every Other Day/30 Days ary Discharge Instructions: Moisten with saline or Hydrogel Secondary Dressing: Optifoam Non-Adhesive Dressing, 4x4 in (Generic) Every Other Day/30 Days Discharge Instructions: Apply over primary dressing as directed. Secondary Dressing: Woven Gauze Sponge, Non-Sterile 4x4 in (Generic) Every Other Day/30 Days Discharge Instructions: Apply over primary dressing as directed. Secured With: 21M Medipore H Soft Cloth Surgical T ape, 4 x 10 (in/yd) (Generic) Every Other Day/30 Days Discharge Instructions: Secure with tape as directed. 08/25/2022: He has more epithelialization of the ulcer with minimal slough and eschar accumulation. I used a curette to debride the slough and eschar from his wound. We will continue using endoform. I really think the offloading we are getting from the peg assist is making a tremendous difference for him. Follow-up in 1 week. Electronic Signature(s) Signed: 08/25/2022 10:31:07 AM By: [name] MD FACS Entered By: [name] on 08/25/2022 10:31:07 -------------------------------------------------------------------------------- HxROS Details Patient Name: Date of Service: [name], TO [place] [name] 08/25/2022 9:30 A M Medical Record Number: [id number] Patient Account Number: [id number] Date of Birth/Sex: Treating RN: 08/22/1975 (47 y.o. M) Primary Care Provider: Dorinda Hill Other Clinician: Referring Provider: Treating Provider/Extender: Jana Hakim in Treatment: 110 Information Obtained From Patient Constitutional Symptoms (General Health) Medical History: Past Medical History Notes: COVID PNA 07/22/2019-11/14/2019 VENT ECMO, foot drop left foot , Eyes Medical  History: Negative for: Cataracts; Glaucoma; Optic Neuritis Ear/Nose/Mouth/Throat Medical History: Negative for: Chronic sinus problems/congestion; Middle ear problems Hematologic/Lymphatic Medical History: Negative for: Anemia; Hemophilia; Human Immunodeficiency Virus; Lymphedema; Sickle Cell Disease Respiratory Medical History: Positive for: Asthma Negative for: Aspiration; Chronic Obstructive Pulmonary Disease (COPD); Pneumothorax; Sleep Apnea; Tuberculosis Cardiovascular Medical History: Positive for: Angina - with COVID; Hypertension Negative for: Arrhythmia; Congestive Heart Failure; Coronary Artery Disease; Deep Vein Thrombosis; Hypotension; Myocardial Infarction; Peripheral Arterial Disease; Peripheral Venous Disease; Phlebitis; Vasculitis [name], STAINBACK ([id number]) [id number] Page 14 of 15 Gastrointestinal Medical History: Negative for: Cirrhosis ; Colitis; Crohns; Hepatitis A; Hepatitis B;  Hepatitis C Endocrine Medical History: Negative for: Type I Diabetes; Type II Diabetes Genitourinary Medical History: Negative for: End Stage Renal Disease Past Medical History Notes: kidney stone Immunological Medical History: Negative for: Lupus Erythematosus; Raynauds; Scleroderma Integumentary (Skin) Medical History: Negative for: History of Burn Musculoskeletal Medical History: Negative for: Gout; Rheumatoid Arthritis; Osteoarthritis; Osteomyelitis Neurologic Medical History: Negative for: Dementia; Neuropathy; Quadriplegia; Paraplegia; Seizure Disorder Oncologic Medical History: Negative for: Received Chemotherapy; Received Radiation Psychiatric Medical History: Negative for: Anorexia/bulimia; Confinement Anxiety Past Medical History Notes: anxiety Immunizations Pneumococcal Vaccine: Received Pneumococcal Vaccination: No Implantable Devices None Hospitalization / Surgery History Type of Hospitalization/Surgery COVID PNA 07/22/2019-  11/14/2019 03/27/2020 wound debridement/ skin graft Family and Social History Cancer: Yes - Maternal Grandparents; Diabetes: Yes - Father,Paternal Grandparents; Heart Disease: Yes - Maternal Grandparents; Hereditary Spherocytosis: No; Hypertension: Yes - Father,Paternal Grandparents; Kidney Disease: No; Lung Disease: Yes - Siblings; Seizures: No; Stroke: No; Thyroid Problems: No; Tuberculosis: No; Never smoker; Marital Status - Married; Alcohol Use: Never; Drug Use: No History; Caffeine Use: Daily - tea, soda; Financial Concerns: No; Food, Clothing or Shelter Needs: No; Support System Lacking: No; Transportation Concerns: No Electronic Signature(s) Signed: 08/25/2022 10:47:40 AM By: Fredirick Maudlin MD FACS Entered By: Fredirick Maudlin on 08/25/2022 10:25:11 [name] ([id number]) [id number] Page 15 of 15 -------------------------------------------------------------------------------- SuperBill Details Patient Name: Date of Service: [name], [place] 08/25/2022 Medical Record Number: [id number] Patient Account Number: [id number] Date of Birth/Sex: Treating RN: Jul 14, 1976 (47 y.o. M) Primary Care Provider: [name] Other Clinician: Referring Provider: Treating Provider/Extender: [name] in Treatment: 110 Diagnosis Coding ICD-10 Codes Code Description [phone] Non-pressure chronic ulcer of other part of left foot with fat layer exposed Facility Procedures : CPT4 Code: [id number] Description: [phone] - DEBRIDE WOUND 1ST 20 SQ CM OR < ICD-10 Diagnosis Description L97.522 Non-pressure chronic ulcer of other part of left foot with fat layer exposed Modifier: Quantity: 1 Physician Procedures : CPT4 Code Description Modifier [id number] - WC PHYS LEVEL 3 - EST PT 25 ICD-10 Diagnosis Description L97.522 Non-pressure chronic ulcer of other part of left foot with fat layer exposed Quantity: 1 : [id number] - WC PHYS DEBR WO ANESTH  20 SQ CM ICD-10 Diagnosis Description L97.522 Non-pressure chronic ulcer of other part of left foot with fat layer exposed Quantity: 1 Electronic Signature(s) Signed: 08/25/2022 10:31:22 AM By: Fredirick Maudlin MD FACS Entered By: Fredirick Maudlin on 08/25/2022 10:31:22

## 2022-08-25 NOTE — Progress Notes (Signed)
PEARL, BERLINGER ([id number]) [id number] Page 1 of 7 Visit Report for [name] Arrival Information Details Patient Name: Date of Service: [name], [name] [name] 9:30 A M Medical Record Number: [id number] Patient Account Number: [id number] Date of Birth/Sex: Treating RN: 1976/07/13 (47 y.o. [name] Primary Care Tmya Wigington: [name] Other Clinician: Referring Sameeha Rockefeller: Treating Azyiah Bo/Extender: [name] in Treatment: 67 Visit Information History Since Last Visit Added or deleted any medications: No Patient Arrived: Wheel Chair Any new allergies or adverse reactions: No Arrival Time: 09:31 Had a fall or experienced change in No Accompanied By: wife activities of daily living that may affect Transfer Assistance: Manual risk of falls: Patient Identification Verified: Yes Signs or symptoms of abuse/neglect since last visito No Patient Requires Transmission-Based Precautions: No Hospitalized since last visit: No Patient Has Alerts: No Implantable device outside of the clinic excluding No cellular tissue based products placed in the center since last visit: Has Dressing in Place as Prescribed: Yes Pain Present Now: Yes Electronic Signature(s) Signed: 08/25/2022 5:39:26 PM By: [name] Entered By: [name] on 08/25/2022 09:31:31 -------------------------------------------------------------------------------- Encounter Discharge Information Details Patient Name: Date of Service: [name], [name] 9:30 A M Medical Record Number: [id number] Patient Account Number: [id number] Date of Birth/Sex: Treating RN: July 15, 1976 (47 y.o. [name] Primary Care Alla Sloma: [name] Other Clinician: Referring Elizzie Westergard: Treating Cherlyn Syring/Extender: [name] in Treatment: 110 Encounter Discharge Information Items Post Procedure Vitals Discharge Condition:  Stable Temperature (F): 98.7 Ambulatory Status: Wheelchair Pulse (bpm): 85 Discharge Destination: Home Respiratory Rate (breaths/min): 16 Transportation: Private Auto Blood Pressure (mmHg): 115/69 Accompanied By: spouse Schedule Follow-up Appointment: Yes Clinical Summary of Care: Patient Declined Electronic Signature(s) Signed: 08/25/2022 5:39:26 PM By: [name] Entered By: [name] on 08/25/2022 17:38:38 [name] ([id number]) [id number] Page 2 of 7 -------------------------------------------------------------------------------- Lower Extremity Assessment Details Patient Name: Date of Service: [name] 9:30 A M Medical Record Number: [id number] Patient Account Number: [id number] Date of Birth/Sex: Treating RN: Dec 20, 1975 (47 y.o. [name] Primary Care Orlyn Odonoghue: [name] Other Clinician: Referring Elonna Mcfarlane: Treating Maison Agrusa/Extender: [name] in Treatment: 110 Edema Assessment Assessed: [name]: No] [name]: No] Edema: [Left: N] [Right: o] Calf Left: Right: Point of Measurement: 29 cm From Medial Instep 44.1 cm 44.5 cm Ankle Left: Right: Point of Measurement: 9 cm From Medial Instep 24.7 cm 25 cm Vascular Assessment Pulses: Dorsalis Pedis Palpable: [Right:Yes] Electronic Signature(s) Signed: 08/25/2022 5:39:26 PM By: [name] Entered By: [name] on 08/25/2022 09:34:13 -------------------------------------------------------------------------------- Multi Wound Chart Details Patient Name: Date of Service: [name], [name] 9:30 A M Medical Record Number: [id number] Patient Account Number: [id number] Date of Birth/Sex: Treating RN: 1976/07/16 (47 y.o. M) Primary Care Braley Luckenbaugh: [name] Other Clinician: Referring Remmi Armenteros: Treating Lonni Dirden/Extender: [name] in Treatment: 110 Vital Signs Height(in):  69 Pulse(bpm): 85 Weight(lbs): 280 Blood Pressure(mmHg): 115/69 Body Mass Index(BMI): 41.3 Temperature(F): 98.7 Respiratory Rate(breaths/min): 16 [2:Photos:] [N/A:N/A] Left Calcaneus N/A N/A Wound Location: Pressure Injury N/A N/A Wounding Event: Pressure Ulcer N/A N/A Primary Etiology: Asthma, Angina, Hypertension N/A N/A Comorbid History: 10/07/2019 N/A N/A Date Acquired: 110 N/A N/A Weeks of Treatment: Open N/A N/A Wound Status: No N/A N/A Wound Recurrence: 0.1x0.1x0.1 N/A N/A Measurements L x W x D (cm) 0.008 N/A N/A A (cm) : rea 0.001 N/A N/A Volume (cm) : 100.00% N/A N/A % Reduction in A rea:  100.00% N/A N/A % Reduction in Volume: Category/Stage III N/A N/A Classification: Medium N/A N/A Exudate A mount: Serosanguineous N/A N/A Exudate Type: red, brown N/A N/A Exudate Color: Thickened N/A N/A Wound Margin: Large (67-100%) N/A N/A Granulation A mount: Red, Pink N/A N/A Granulation Quality: Small (1-33%) N/A N/A Necrotic A mount: Eschar, Adherent Slough N/A N/A Necrotic Tissue: Fat Layer (Subcutaneous Tissue): Yes N/A N/A Exposed Structures: Fascia: No Tendon: No Muscle: No Joint: No Bone: No Medium (34-66%) N/A N/A Epithelialization: Debridement - Selective/Open Wound N/A N/A Debridement: Pre-procedure Verification/Time Out 09:55 N/A N/A Taken: Lidocaine 5% topical ointment N/A N/A Pain Control: Necrotic/Eschar, Slough N/A N/A Tissue Debrided: Non-Viable Tissue N/A N/A Level: 0.01 N/A N/A Debridement A (sq cm): rea Curette N/A N/A Instrument: Minimum N/A N/A Bleeding: Pressure N/A N/A Hemostasis A chieved: 0 N/A N/A Procedural Pain: 0 N/A N/A Post Procedural Pain: Procedure was tolerated well N/A N/A Debridement Treatment Response: 0.1x0.1x0.1 N/A N/A Post Debridement Measurements L x W x D (cm) 0.001 N/A N/A Post Debridement Volume: (cm) Category/Stage III N/A N/A Post Debridement Stage: Callus: Yes N/A  N/A Periwound Skin Texture: Scarring: Yes Maceration: Yes N/A N/A Periwound Skin Moisture: No Abnormalities Noted N/A N/A Periwound Skin Color: No Abnormality N/A N/A Temperature: Debridement N/A N/A Procedures Performed: Treatment Notes Electronic Signature(s) Signed: 08/25/2022 10:23:50 AM By: Fredirick Maudlin MD FACS Entered By: Fredirick Maudlin on 08/25/2022 10:23:50 -------------------------------------------------------------------------------- Multi-Disciplinary Care Plan Details Patient Name: Date of Service: [name], [name] 9:30 A M Medical Record Number: [id number] Patient Account Number: [id number] Date of Birth/Sex: Treating RN: 16-May-1976 (47 y.o. [name] Primary Care Raiden Yearwood: [name] Other Clinician: Referring Micholas Drumwright: Treating Foster Frericks/Extender: [name] in Treatment: Emmett reviewed with physician [address] JAKOLBY, SEDIVY ([id number]) [id number] Page 4 of 7 Wound/Skin Impairment Nursing Diagnoses: Impaired tissue integrity Knowledge deficit related [name] ulceration/compromised skin integrity Goals: Patient/caregiver will verbalize understanding of skin care regimen Date Initiated: 07/15/2020 Target Resolution Date: 03/08/2023 Goal Status: Active Ulcer/skin breakdown will have a volume reduction of 30% by week 4 Date Initiated: 07/15/2020 Date Inactivated: 08/20/2020 Target Resolution Date: 09/03/2020 Goal Status: Unmet Unmet Reason: no major changes. Ulcer/skin breakdown will heal within 14 weeks Date Initiated: 12/04/2020 Date Inactivated: 12/10/2020 Target Resolution Date: 12/10/2020 Unmet Reason: wounds still open at 14 Goal Status: Unmet weeks and today 21 weeks. Interventions: Assess patient/caregiver ability [name] obtain necessary supplies Assess patient/caregiver ability [name] perform ulcer/skin care regimen upon admission and as needed Assess  ulceration(s) every visit Provide education on ulcer and skin care Treatment Activities: Skin care regimen initiated : 07/15/2020 Topical wound management initiated : 07/15/2020 Notes: Electronic Signature(s) Signed: 08/25/2022 5:39:26 PM By: [name] Entered By: [name] on 08/25/2022 17:37:23 -------------------------------------------------------------------------------- Pain Assessment Details Patient Name: Date of Service: [name], [place] [name] 9:30 A M Medical Record Number: [id number] Patient Account Number: [id number] Date of Birth/Sex: Treating RN: Nov 19, 1975 (47 y.o. [name] Primary Care Jalayia Bagheri: [name] Other Clinician: Referring Mirela Parsley: Treating Kaelin Bonelli/Extender: [name] in Treatment: 110 Active Problems Location of Pain Severity and Description of Pain Patient Has Paino Yes Site Locations Pain Location: Generalized Pain With Dressing Change: No Duration of the Pain. Constant / Intermittento Constant Rate the pain. Current Pain Level: 4 Worst Pain Level: 9 Least Pain Level: 1 Tolerable Pain Level: 4 [name], [name] ([id number]) [id number] Page 5 of 7 Character of Pain Describe the Pain: Aching Pain Management  and Medication Current Pain Management: Medication: Yes Cold Application: No Rest: Yes Massage: No Activity: No T.MckenzieN.S.: No Heat Application: No Leg drop or elevation: No Is the Current Pain Management Adequate: Adequate How does your wound impact your activities of daily livingo Sleep: No Bathing: No Appetite: No Relationship With Others: No Bladder Continence: No Emotions: No Bowel Continence: No Work: No Toileting: No Drive: No Dressing: No Hobbies: No Electronic Signature(s) Signed: 08/25/2022 5:39:26 PM By: [name] RN Entered By: [name] on 08/25/2022  09:33:58 -------------------------------------------------------------------------------- Patient/Caregiver Education Details Patient Name: Date of Service: [name] 1/18/2024andnbsp9:30 A M Medical Record Number: [id number] Patient Account Number: [id number] Date of Birth/Gender: Treating RN: June 02, 1976 (47 y.o. [name] Primary Care Physician: Dorinda Hill Other Clinician: Referring Physician: Treating Physician/Extender: Jana Hakim in Treatment: 110 Education Assessment Education Provided [name]: Patient Education Topics Provided Wound/Skin Impairment: Methods: Explain/Verbal Responses: Return demonstration correctly Electronic Signature(s) Signed: 08/25/2022 5:39:26 PM By: [name] RN Entered By: [name] on 08/25/2022 17:37:37 -------------------------------------------------------------------------------- Wound Assessment Details Patient Name: Date of Service: [name], [name] [name] [name] 9:30 A M Medical Record Number: [id number] Patient Account Number: [id number] Date of Birth/Sex: Treating RN: [birth year] (46 y.o. [name] Primary Care Davyd Podgorski: Dorinda Hill Other Clinician: Referring Ellerie Arenz: Treating Muhammadali Ries/Extender: Jana Hakim in Treatment: [address] ([id number]) [id number] Page 6 of 7 Wound Status Wound Number: 2 Primary Etiology: Pressure Ulcer Wound Location: Left Calcaneus Wound Status: Open Wounding Event: Pressure Injury Comorbid History: Asthma, Angina, Hypertension Date Acquired: 10/07/2019 Weeks Of Treatment: 110 Clustered Wound: No Photos Wound Measurements Length: (cm) 0.1 Width: (cm) 0.1 Depth: (cm) 0.1 Area: (cm) 0.008 Volume: (cm) 0.001 % Reduction in Area: 100% % Reduction in Volume: 100% Epithelialization: Medium (34-66%) Tunneling: No Undermining: No Wound Description Classification: Category/Stage III Wound  Margin: Thickened Exudate Amount: Medium Exudate Type: Serosanguineous Exudate Color: red, brown Foul Odor After Cleansing: No Slough/Fibrino Yes Wound Bed Granulation Amount: Large (67-100%) Exposed Structure Granulation Quality: Red, Pink Fascia Exposed: No Necrotic Amount: Small (1-33%) Fat Layer (Subcutaneous Tissue) Exposed: Yes Necrotic Quality: Eschar, Adherent Slough Tendon Exposed: No Muscle Exposed: No Joint Exposed: No Bone Exposed: No Periwound Skin Texture Texture Color No Abnormalities Noted: No No Abnormalities Noted: Yes Callus: Yes Temperature / Pain Scarring: Yes Temperature: No Abnormality Moisture No Abnormalities Noted: No Maceration: Yes Treatment Notes Wound #2 (Calcaneus) Wound Laterality: Left Cleanser Normal Saline Discharge Instruction: Cleanse the wound with Normal Saline prior [name] applying a clean dressing using gauze sponges, not tissue or cotton balls. Wound Cleanser Discharge Instruction: Cleanse the wound with wound cleanser prior [name] applying a clean dressing using gauze sponges, not tissue or cotton balls. Peri-Wound Care Topical Primary Dressing Endoform 2x2 in Discharge Instruction: Moisten with saline or Hydrogel Secondary Dressing [name], [name] ([id number]) [id number] Page 7 of 7 Optifoam Non-Adhesive Dressing, 4x4 in Discharge Instruction: Apply over primary dressing as directed. Woven Gauze Sponge, Non-Sterile 4x4 in Discharge Instruction: Apply over primary dressing as directed. Secured With 78M Medipore H Soft Cloth Surgical T ape, 4 x 10 (in/yd) Discharge Instruction: Secure with tape as directed. Compression Wrap Compression Stockings Add-Ons Electronic Signature(s) Signed: 08/25/2022 5:39:26 PM By: [name] RN Entered By: [name] on 08/25/2022 09:55:29 -------------------------------------------------------------------------------- Vitals Details Patient Name: Date of  Service: [name], [name] 9:30 A M Medical Record Number: [id number] Patient Account Number: [id number] Date of Birth/Sex: Treating RN: [birth year] (46 y.o. M) [name],  [name] Primary Care Mivaan Corbitt: Dorinda Hill Other Clinician: Referring Floride Hutmacher: Treating Ryllie Nieland/Extender: Jana Hakim in Treatment: 110 Vital Signs Time Taken: 09:31 Temperature (F): 98.7 Height (in): 69 Pulse (bpm): 85 Weight (lbs): 280 Respiratory Rate (breaths/min): 16 Body Mass Index (BMI): 41.3 Blood Pressure (mmHg): 115/69 Reference Range: 80 - 120 mg / dl Electronic Signature(s) Signed: 08/25/2022 5:39:26 PM By: [name] RN Entered By: [name] on 08/25/2022 10:02:24

## 2022-08-29 ENCOUNTER — Other Ambulatory Visit: Payer: Self-pay | Admitting: Behavioral Health

## 2022-09-01 ENCOUNTER — Encounter (HOSPITAL_BASED_OUTPATIENT_CLINIC_OR_DEPARTMENT_OTHER): Payer: PPO | Admitting: General Surgery

## 2022-09-01 DIAGNOSIS — L97522 Non-pressure chronic ulcer of other part of left foot with fat layer exposed: Secondary | ICD-10-CM | POA: Diagnosis not present

## 2022-09-01 DIAGNOSIS — L89623 Pressure ulcer of left heel, stage 3: Secondary | ICD-10-CM | POA: Diagnosis not present

## 2022-09-01 NOTE — Progress Notes (Signed)
LASH, MATULICH ([id number]) [id number] Page 1 of 15 Visit Report for 09/01/2022 Chief Complaint Document Details Patient Name: Date of Service: [name], TO [name] 09/01/2022 9:30 A M Medical Record Number: [id number] Patient Account Number: [id number] Date of Birth/Sex: Treating RN: Dec 06, 1975 (48 y.o. M) Primary Care Provider: Dorinda Hill Other Clinician: Referring Provider: Treating Provider/Extender: Jana Hakim in Treatment: 111 Information Obtained from: Patient Chief Complaint Bilateral Plantar Foot Ulcers Electronic Signature(s) Signed: 09/01/2022 10:38:07 AM By: [name] MD FACS Entered By: [name] on 09/01/2022 10:38:07 -------------------------------------------------------------------------------- Debridement Details Patient Name: Date of Service: [name], [name] 09/01/2022 9:30 A M Medical Record Number: [id number] Patient Account Number: [id number] Date of Birth/Sex: Treating RN: 23-Jul-1976 (47 y.o. [name] Primary Care Provider: Dorinda Hill Other Clinician: Referring Provider: Treating Provider/Extender: Jana Hakim in Treatment: 111 Debridement Performed for Assessment: Wound #2 Left Calcaneus Performed By: Physician [name], MD Debridement Type: Debridement Level of Consciousness (Pre-procedure): Awake and Alert Pre-procedure Verification/Time Out Yes - 10:30 Taken: Start Time: 10:30 Pain Control: Lidocaine 5% topical ointment T Area Debrided (L x W): otal 0.1 (cm) x 0.1 (cm) = 0.01 (cm) Tissue and other material debrided: Non-Viable, Slough, Slough Level: Non-Viable Tissue Debridement Description: Selective/Open Wound Instrument: Curette Bleeding: Minimum Hemostasis Achieved: Pressure End Time: 10:32 Procedural Pain: 0 Post Procedural Pain: 0 Response to Treatment: Procedure was tolerated well Level of Consciousness (Post- Awake and  Alert procedure): Post Debridement Measurements of Total Wound Length: (cm) 0.1 Stage: Category/Stage III Width: (cm) 0.1 Depth: (cm) 0.1 Volume: (cm) 0.001 Character of Wound/Ulcer Post Debridement: Improved Post Procedure Diagnosis ZYMIERE, TROSTLE ([id number]) [id number] Page 2 of 15 Same as Pre-procedure Notes Scribed for Dr. Lady Gary by J.Scotton Electronic Signature(s) Signed: 09/01/2022 12:10:53 PM By: [name] MD FACS Signed: 09/01/2022 12:11:12 PM By: [name] RN Entered By: [name] on 09/01/2022 10:35:41 -------------------------------------------------------------------------------- HPI Details Patient Name: Date of Service: [name], [name] 09/01/2022 9:30 A M Medical Record Number: [id number] Patient Account Number: [id number] Date of Birth/Sex: Treating RN: 19-Apr-1976 (46 y.o. M) Primary Care Provider: Dorinda Hill Other Clinician: Referring Provider: Treating Provider/Extender: Jana Hakim in Treatment: 111 History of Present Illness HPI Description: Wounds are12/03/2020 upon evaluation today patient presents for initial inspection here in our clinic concerning issues he has been having with the bottoms of his feet bilaterally. He states these actually occurred as wounds when he was hospitalized for 5 months secondary to Covid. He was apparently with tilting bed where he was in an upright position quite frequently and apparently this occurred in some way shape or form during that time. Fortunately there is no sign of active infection at this time. No fevers, chills, nausea, vomiting, or diarrhea. With that being said he still has substantial wounds on the plantar aspects of his feet Theragen require quite a bit of work to get these to heal. He has been using Santyl currently though that is been problematic both in receiving the medication as well as actually paid for it as it is become quite  expensive. Prior to the experience with Covid the patient really did not have any major medical problems other than hypertension he does have some mild generalized weakness following the Covid experience. 07/22/2020 on evaluation today patient appears to be doing okay in regard to his foot ulcers I feel like the wound beds are showing signs of better improvement that I do believe the Iodoflex is  helping in this regard. With that being said he does have a lot of drainage currently and this is somewhat blue/green in nature which is consistent with Pseudomonas. I do think a culture today would be appropriate for [place] to evaluate and see if that is indeed the case I would likely start him on antibiotic orally as well he is not allergic to Cipro knows of no issues he has had in the past 12/21; patient was admitted to the clinic earlier this month with bilateral presumed pressure ulcers on the bottom of his feet apparently related to excessive pressure from a tilt table arrangement in the intensive care unit. Patient relates this to being on ECMO but I am not really sure that is exactly related to that. I must say I have never seen anything like this. He has fairly extensive full-thickness wounds extending from his heel towards his midfoot mostly centered laterally. There is already been some healing distally. He does not appear to have an arterial issue. He has been using gentamicin to the wound surfaces with Iodoflex to help with ongoing debridement 1/6; this is a patient with pressure ulcers on the bottom of his feet related to excessive pressure from a standing position in the intensive care unit. He is complaining of a lot of pain in the right heel. He is not a diabetic. 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 We have been using Iodoflex to help prepare the surfaces of both wounds for an advanced treatment product. He is nonambulatory spending most of his time in a wheelchair  I have asked him not to propel the wheelchair with his heels 1/13; in general his wounds look better not much surface area change we have been using Iodoflex as of last week. I did an x-ray of the right heel as the patient was complaining of pain. I had some thoughts about a stress fracture perhaps Achilles tendon problems however what it showed was erosive changes along the inferior aspect of the calcaneus he now has a MRI booked for 1/20. 1/20; in general his wounds continue to be better. Some improvement in the large narrow areas proximally in his foot. He is still complaining of pain in the right heel and tenderness in certain areas of this wound. His MRI is tonight. I am not just looking for osteomyelitis that was brought up on the x-ray I am wondering about stress fractures, tendon ruptures etc. He has no such findings on the left. Also noteworthy is that the patient had critical illness neuropathy and some of the discomfort may be actual improvement in nerve function I am just not sure. These wounds were initially in the setting of severe critical illness related to COVID-19. He was put in a standing position. He may have also been on pressors at the point contributing to tissue ischemia. By his description at some point these wounds were grossly necrotic extending proximally up into the Achilles part of his heel. I do not know that I have ever really seen pictures of them like this although they may exist in epic We have ordered Tri layer Oasis. I am trying to stimulate some granulation in these areas. This is of course assuming the MRI is negative for infection 1/27; since the patient was last here he saw Dr. Juleen China of infectious disease. He is planned for vancomycin and ceftriaxone. Prior operative culture grew MSSA. Also ordered baseline lab work. He also ordered arterial studies although the ABIs in our clinic  were normal as well as his clinical exam these were normal I do not think he  needs to see vascular surgery. His ABIs at the [facility] were 1.22 in the right triphasic waveforms with a normal TBI of 1.15 on the left ABI of 1.22 with triphasic waveforms and a normal TBI of 1.08. Finally he saw [name] who will follow him in for 2 months. At this point I do not think he felt that he needed a procedure on the right calcaneal bone. [name] is elected for broad-spectrum antibiotic The patient is still having pain in the right heel. He walks with a walker 2/3; wounds are generally smaller. He is tolerating his IV antibiotics. I believe this is vancomycin and ceftriaxone. We are still waiting for Oasis burn in terms of his out-of-pocket max which he should be meeting soon given the IV antibiotics, MRIs etc. I have asked him to check in on this. We are using silver collagen in the meantime the wounds look better 2/10; tolerating IV vancomycin and Rocephin. We are waiting to apply for Oasis. Although I am not really sure where he is in his out-of-pocket max. 2/17 started the first application of Oasis trilayer. Still on antibiotics. The wounds have generally look better. The area on the left has a little more surface [name], [name] ([id number]) [id number] Page 3 of 15 slough requiring debridement 2/24; second application of Oasis trilayer. The wound surface granulation is generally look better. The area on the left with undermining laterally I think is come in a bit. 10/08/2020 upon evaluation today patient is here today for [employer] application #3. Fortunately he seems to be doing extremely well with regard to this and we are seeing a lot of new epithelial growth which is great news. Fortunately there is no signs of active infection at this time. 10/16/2020 upon evaluation today patient appears to be doing well with regard to his foot ulcers. Do believe the Oasis has been of benefit for him. I do not see any signs of infection right now which is great  news and I think that he has a lot of new epithelial growth which is great to see as well. The patient is very pleased to hear all of this. I do think we can proceed with the Oasis trilayer #4 today. 3/18; not as much improvement in these areas on his heels that I was hoping. I did reapply trilateral Oasis today the tissue looks healthier but not as much fill in as I was hoping. 3/25; better looking today I think this is come in a bit the tissue looks healthier. Triple layer Oasis reapplied #6 4/1; somewhat better looking definitely better looking surface not as much change in surface area as I was hoping. He may be spending more time [place] on days then he needs to although he does have heel offloading boots. Triple layer Oasis reapplied #7 4/7; unfortunately apparently [facility] will not approve any further Oasis which is unfortunate since the patient did respond nicely both in terms of the condition of the wound bed as well as surface area. There is still some drainage coming from the wound but not a lot there does not appear to be any infection 4/15; we have been using Hydrofera Blue. He continues to have nice rims of epithelialization on the right greater than the left. The left the epithelialization is coming from the tip of his heel. There is moderate drainage. In this that concerns me  about a total contact cast. There is no evidence of infection 4/29; patient has been using Hydrofera Blue with dressing changes. He has no complaints or issues today. 5/5; using Hydrofera Blue. I actually think that he looks marginally better than the last time I saw this 3 weeks ago. There are rims of epithelialization on the left thumb coming from the medial side on the right. Using Hydrofera Blue 5/12; using Hydrofera Blue. These continue to make improvements in surface area. His drainage was not listed as severe I therefore went ahead and put a cast on the left foot. Right foot we will continue  to dress his previous 5/16; back for first total contact cast change. He did not tolerate this particularly well cast injury on the anterior tibia among other issues. Difficulty sleeping. I talked him about this in some detail and afterwards is elected to continue. I told him I would like to have a cast on for 3 weeks to see if this is going to help at all. I think he agreed 5/19; I think the wound is better. There is no tunneling towards his midfoot. The undermining medially also looks better. He has a rim of new skin distally. I think we are making progress here. The area on the left also continues to look somewhat better to me using Hydrofera Blue. He has a list of complaints about the cast but none of them seem serious 5/26; patient presents for 1 week follow-up. He has been using a total contact cast and tolerating this well. Hydrofera Blue is the main dressing used. He denies signs of infection. 6/2 Hydrofera Blue total contact cast on the left. These were large ulcers that formed in intensive care unit where the patient was recovering from [place]. May have had something to do with being ventilated in an upright positiono Pressors etc. We have been able to get the areas down considerably and a viable surface. There is some epithelialization in both sides. Note made of drainage 6/9; changed to [facility] last time because of drainage. He arrives with better looking surfaces and dimensions on the left than the right. Paradoxically the right actually probes more towards his midfoot the left is largely close down but both of these look improved. Using a total contact cast on the left 6/16; complex wounds on his bilateral plantar heels which were initially pressure injury from a stay in the ICU with COVID. We have been using silver alginate most recently. His dimensions of come in quite dramatically however not recently. We have been putting the left foot in a total contact cast 6/23; complex  wounds on the bilateral plantar heels. I been putting the left in the cast paradoxically the area on the right is the one that is going towards closure at a faster rate. Quite a bit of drainage on the left. The patient went to see Dr. Amalia Hailey who said he was going to [facility] for skin grafts. I had actually considered sending him for skin grafts however he would be mandatorily off his feet for a period of weeks to months. I am thinking that the area on the right is going to close on its own the area on the left has been more stubborn even though we have him in a total contact cast 6/30; took him out of a total contact cast last week is the right heel seem to be making better progress than the left where I was placing the cast. We are using silver alginate.  Both wounds are smaller right greater than left 7/12; both wounds look as though they are making some progress. We are using silver alginate. Heel offloading boots 7/26; very gradual progress especially on the right. Using silver alginate. He is wearing heel offloading boots 8/18; he continues to close these wounds down very gradually. Using silver alginate. The problem polymen being definitive about this is areas of what appears to be callus around the margins. This is not a 100% of the area but certainly sizable especially on the right 9/1; bilateral plantar feet wounds secondary to prolonged pressure while being ventilated for COVID-19 in an upright position. Essentially pressure ulcers on the bottom of his feet. He is made substantial progress using silver alginate. 9/14; bilateral plantar feet wounds secondary to prolonged pressure. Making progress using silver alginate. 9/29 bilateral plantar feet wounds secondary to prolonged pressure. I changed him to Iodoflex last week. MolecuLight showing reddened blush fluorescence 10/11; patient presents for follow-up. He has no issues or complaints today. He denies signs of infection. He continues to use  Iodoflex and antibiotic ointment to the wound beds. 10/27; 2-week follow-up. No evidence of infection. He has callus and thick dry skin around the wound margins we have been using Iodoflex and Bactroban which was in response to a moderate left MolecuLight reddish blush fluorescence. 11/10; 2-week follow-up. Wound margins again have thick callus however the measurements of the actual wound sites are a lot smaller. Everything looks reasonably healthy here. We have been using Iodoflex He was approved for prime matrix but I have elected to delay this given the improvement in the surface area. Hopefully I will not regret that decision as were getting close to the end of the year in terms of insurance payment 12/8; 2-week follow-up. Wounds are generally smaller in size. These were initially substantial wounds extending into the forefoot all the way into the heel on the bilateral plantar feet. They are now both located on the plantar heel distal aspect both of these have a lot of callus around the wounds I used a #5 curette to remove this on the right and the left also some subcutaneous debris to try and get the wound edges were using Iodoflex. He has heel offloading shoe 12/22; 2-week follow-up. Not really much improvement. He has thick callus around the outer edges of both wounds. I remove this there is some nonviable subcutaneous tissue as well. We have been using Iodoflex. Her intake nurse and myself spontaneously thought of a total contact cast I went back in May. At that time we really were not seeing much of an improvement with a cast although the wound was in a much different situation I would like to retry this in 2 weeks and I discussed this with the patient 08/12/2021; the patient has had some improvement with the Iodoflex. The the area on the left heel plantar more improved than the right. I had to put him in a total contact cast on the left although I decided to put that off for 2 weeks. I am  going to change his primary dressing to silver collagen. I think in both areas he has had some improvement most of the healing seems to be more proximal in the heel. The wounds are in the mid aspect. A lot of thick callus on the right heel however. 1/19; we are using silver collagen on both plantar heel areas. He has had some improvement today. The left did not require any debridement. He still had some  eschar on the right that was debrided but both seem to have contracted. I did not put it total contact cast on him today 2/2 we have been using silver collagen. The area on the right plantar heel has areas that appear to be epithelialized interspersed with dry flaking callus and dry skin. I removed this. This really looks better than on the other side. On the left still a large area with raised edges and debris on the surface. 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 He has no issues or complaints today. 2/9; not much change in the left foot wound with 1 week of a cast we are using silver collagen. Silver collagen on the right side. The right side has been the better wound surface. We will reapply the total contact cast on the left 2/16; not much improvement on either side I been using silver collagen with a total contact cast on the left. [facility]'m [facility] still with a total contact cast on the left Morton StallGOURLEY, Amanuel E ([id number]) [id number] Page 4 of 15 2/23; some improvement on both sides. Disappointing that he has thick callus around the area that we are putting in a total contact cast on the left. We've been using Hydrofera Blue on both wound areas. This is a man who at essentially pressure ulcers in addition to ischemia caused by medications to support his blood pressure (pressors) in the ICU. He was being ventilated in the  standing position for severe Covid. A Shiley the wounds extended across his entire foot but are now localized to his plantar heels bilaterally. We have made progress however neither areas healed. I continue to think the total contact cast is helped albeit painstakingly slowly. He has never wanted a plastic surgery consult although I don't know that they would be interested in grafting in area in this location. 10/07/2021: Continued improvement bilaterally. There is still some callus around the left wound, despite the total contact cast. He has some increased pain in his right midfoot around 1 particular area. This has been painful in the past but seems to be a little bit worse. When his cast was removed today, there was an area on the heel of the left foot that looks a bit macerated. He is also complaining of pain in his left thigh and hip which he thinks is secondary to the limb length discrepancy caused by the cast. 10/14/2021: He continues to improve. A little bit less callus around the left wound. He continues to endorse pain in his right midfoot, but this is not as significant as it was last week. The maceration on his left heel is improved. 10/21/2021: Continued improvement to both wounds. The maceration on his left heel is no longer evident. Less callus bilaterally. Epithelialization progressing. 10/28/2021: Significant improvement this week. The right sided wound is nearly closed with just a small open area at the middle. No maceration seen on the left heel. Continued epithelialization on both sides. No concern for infection. 11/04/2021: T oday, the wounds were measured a little bit differently and come out as larger, but I actually think they are about the same to potentially even smaller, particularly on the left. He continues to accumulate some callus on the right. 11/11/2021: T oday, the patient is expressing some concern that the left wound, despite being in the total contact cast, is not  progressing at the same rate as the 1 on the right. He is interested in trying a week without the cast to see how the wound does. The wounds are roughly the same size as last week, with the right perhaps being a little bit smaller. He continues to build up callus on both sites. 11/18/2021: Last week, I permitted the patient to go without his total contact cast, just to see if the cast was really making any difference. Today, both wounds have deteriorated to some extent, suggesting that the cast is providing benefit, at least on the left. Both are larger and have accumulated callus, slough, and other debris. 11/26/2021: I debrided both wounds quite aggressively last week in an effort to stimulate the healing cascade. This appears to have been effective as the left sided wound is a full centimeter shorter in length. Although the right was measured slightly larger, on inspection, it looks as though an area of epithelialized tissue was included in the measurements. We have been using PolyMem Ag on the wound surfaces with a total contact cast to the left. 12/02/2021: It appears that the intake personnel are including epithelialized tissue in his wound measurements; the right wound is almost completely epithelialized; there is just a crater at the proximal midfoot with some open areas. On the left, he has built up some callus, but the overall wound surface looks good. There is some senescent skin around the wound margin. He has been in PolyMem Ag bilaterally with a total contact cast on the left. 12/09/2021: The right wound is nearly closed; there is just a small open area at the mid calcaneus. On the left, the wound is smaller with minimal callus buildup. No significant drainage. 12/16/2021: The right calcaneal wound remains minimally open at the mid calcaneus; the rest has epithelialized. On the left, the wound is also a little bit smaller. There is some senescent tissue on the periphery. He is getting his  first application of a trial skin substitute called Vendaje today. 12/23/2021: The wound on his right calcaneus is nearly closed; there is just a small area at the most distal aspect of the calcaneus that is open. On the left, the area where we applied to the skin substitute has a healthier-looking bed of granulation tissue. The wound dimensions are not significantly different on this side but the wound surface is improved. 12/30/2021: The wound on the right calcaneus has not changed significantly aside from some accumulation of callus. On the left, the open area is smaller and continues to have an improved surface. He continues to accumulate callus around the wound. He is here for his third application of Vendaje. 01/06/2022: The right calcaneal wound is down to just a couple of millimeters. He continues to accumulate periwound callus. He unfortunately got his cast wet earlier in the week and his left foot is macerated, resulting in some superficial skin loss just distal to the open wound. The open wound itself, however, is much smaller and has a healthier appearing surface. He is here for his fourth application of Vendaje. 01/13/2022: The right calcaneal wound is about the same. Unfortunately, once again, his cast got wet and his foot is again macerated. This is caused the left calcaneal wound to enlarge. He is here for his fifth application of Vendaje. 01/20/2022: The right calcaneal wound is very small. There is some periwound callus accumulation. He purchased a new cast protector last week and this has been effective in avoiding the maceration that has been occurring  on the left. The left calcaneal wound is narrower and has a healthy and viable-appearing surface. He is here for his 6 application of Vendaje. 01/27/2022: The right calcaneal wound is down to just a pinhole. There is some periwound slough and callus. On the left, the wound is narrower and shorter by about a centimeter. The surface is robust  and viable-appearing. Unfortunately, the rep for the trial skin substitute product did not provide any for [place] to use today. 02/04/2022: The right calcaneal wound remains unchanged. There is more accumulated callus. On the left, although the intake nurse measured it a little bit longer, it looks about the same to me. The surface has a layer of slough, but underneath this, there is good granulation tissue. 02/10/2022: The right calcaneus wound is nearly closed. There is still some callus that builds up around the site. The left side looks about the same in terms of dimensions, but the surface is more robust and vital-appearing. 02/16/2022: The area of the right calcaneus that was nearly closed last week has closed, but there is a small opening at the mid foot where it looks like some moisture got retained and caused some reopening. The left foot wound is narrower and shallower. Both sites have a fair amount of periwound callus and eschar. 02/24/2022: The small midfoot opening on the right calcaneus is a little bit smaller today. The left foot wound is narrower and shallower. He continues to accumulate periwound callus. No concern for infection. 03/01/2022: The patient came to clinic early because he showered and got his cast wet. Fortunately, there is no significant maceration to his foot but the callus softened and it looks like the wound on his left calcaneus may be a little bit wider. The wound on his right calcaneus is just a narrow slit. Continued accumulation of periwound callus bilaterally. 03/08/2022: The wound on his right calcaneus is very nearly closed, just a small pinpoint opening under a bit of eschar; the left wound has come in quite a bit, as well. It is narrower and shorter than at our last visit. Still with accumulated callus and eschar bilaterally. 03/17/2022: The right calcaneal wound is healed. The left wound is smaller and the surface itself is very clean, but there is some blue-green  staining on the periwound callus, concerning for Pseudomonas aeruginosa. 03/23/2022: The right calcaneal wound remains closed. The left wound continues to contract. No further blue-green staining. Small amount of callus and slough accumulation. LAMONE, FERRELLI ([id number]) [id number] Page 5 of 15 03/28/2022: He came in early today because he had gotten his cast wet. On inspection, the wound itself did not get wet or macerated, just a little bit of the forefoot. The wound itself is basically unchanged. 04/07/2022: The right foot wound remains closed. The left wound is the smallest that I have seen it to date. It is narrower and shorter. It still continues to accumulate slough on the surface. 04/15/2022: There is a band of epithelium now dividing the small left plantar foot wound in 2. There is still some slough on the surface. 04/21/2022: The wound continues to narrow. Just a little bit of slough on the surface. He seems to be responding well to endoform. 04/28/2022: Continued slow contraction of the wound. There is a little slough on the surface and some periwound callus. We have been using endoform and total contact cast. 05/05/2022: The wound appears to have stalled. There is slough and some periwound eschar/callus. No concern for infection, however. 05/12/2022: Unfortunately,  his right foot has reopened. It is located at the most posterior aspect of his surgical incision. The area was noted to have drainage coming from it when his padding was removed today. Underneath some callus and senescent skin, there is an opening. No purulent drainage or malodor. On the left foot, the wound is again unchanged. There is some light blue staining on the callus, but no malodor or purulent drainage. 10/13; right and left heel remanence of extensive plantar foot wounds. These are better than I remember by quite a big margin however he is still left with wounds on the left plantar heel and the  right plantar heel. Been using endoform bilaterally. A culture was done that showed apparently Pseudomonas but we are still waiting for the [facility] antibiotic to use gentamicin today. He is still very active by description I am not sure about the offloading of his noncasted right foot 10/20; both wounds right and left heel debrided not much change from last week. [employer] has arrived which is linezolid, gentamicin and ciprofloxacin we will use this with endoform. T contact cast on the left otal 06/02/2022: Both wounds are smaller today. There is still a fair amount of callus buildup around the right foot ulcer. The left is more superficial and nearly flush with the surrounding tissues. Also with slough and eschar buildup. 06/10/2022: The right sided wound appears to be nearly closed, if not completely so, although it is somewhat difficult to tell given the abnormal tissue and scarring in his foot. There is a fair amount of callus and crust accumulation. On the left, the wound looks about the same, again with callus and slough. He has an appointment next Thursday with Dr. Loel Lofty in podiatry; I am hopeful that there may be some reconstructive options available for Mr. Klutz. 06/16/2022: Both wounds have some eschar and callus accumulation. The right sided wound is extremely narrow and barely open; the left is narrower than last week. There is a little bit of slough. He has his appointment in podiatry later today. 06/23/2022: The patient met with Dr. Loel Lofty last week and unfortunately, there are no reconstructive options that he believes would be helpful. He did order an MRI to evaluate for osteomyelitis and fortunately, none was seen. The left sided wound is a little bit shorter and narrower today. The right sided wound is about the same. There is callus and eschar accumulation bilaterally. 06/29/2022: Both feet have improved from last week. There is epithelium making a valiant effort to creep  across the surface on the left. The right side looks like it got a little dry and the deep crevasse in his midfoot has cracked. Both have eschar and there is some slough on the left. 07/07/2022: Both feet have improved. There is epithelium completely covering the calcaneus on the right with just a small opening in the crevasse in his midfoot. On the left, the open area of tissue is smaller but he continues to build up callus/eschar and slough. 07/15/2022: The opening in the midfoot on the right is about the same size, covered with eschar and a little bit of slough. The open portion of the left wound is narrower and shorter with a bit of slough buildup. He admits to being on his feet more than recommended. 12/14; as far as I can tell everything on the right foot is closed. There is some eschar I removed some of this I cannot identify any open wound here. As usual this will be a very vulnerable  area going forward. On the left this looks really quite healthy. I was pleasantly surprised to see how good this looked. Wound is certainly smaller and there appears to be healthy epithelialization. He has been using Promogran on the right and endoform on the left. He has been offloading the right foot with a heel offloading boot and he has a running shoe on the right foot 08/04/2022: The right foot remains closed. He has a thick cushioned insole in his sneaker. The left sided wound is smaller with just some slough and eschar accumulation. He is wearing the heel off loader on this foot. 08/15/2022: The right foot remains closed. The left wound has narrowed further. There is some slough and eschar accumulation. 08/25/2022: We put him in a peg assist shoe insert and as a result, he has more epithelialization of the ulcer with minimal slough and eschar accumulation. 09/01/2022: The wound is smaller by about half this week. Still with some slough on the surface. The peg assist shoe seems to be doing a remarkable job  of adequately offloading the site. Electronic Signature(s) Signed: 09/01/2022 10:38:49 AM By: [name] MD FACS Entered By: [name] on 09/01/2022 10:38:49 -------------------------------------------------------------------------------- Physical Exam Details Patient Name: Date of Service: [name], [name] 09/01/2022 9:30 A M Medical Record Number: [id number] Patient Account Number: [id number] Date of Birth/Sex: Treating RN: [birth year] (47 y.o. M) Primary Care Provider: Dorinda Hill Other Clinician: Referring Provider: Treating Provider/Extender: Jana Hakim in Treatment: [address] E ([id number]) [id number] Page 6 of 15 Constitutional . . . . no acute distress. Respiratory Normal work of breathing on room air. Notes 09/01/2022: The wound is smaller by about half this week. Still with some slough on the surface. Electronic Signature(s) Signed: 09/01/2022 10:39:19 AM By: [name] MD FACS Entered By: [name] on 09/01/2022 10:39:19 -------------------------------------------------------------------------------- Physician Orders Details Patient Name: Date of Service: [name], TO [name] 09/01/2022 9:30 A M Medical Record Number: [id number] Patient Account Number: [id number] Date of Birth/Sex: Treating RN: 1976/06/10 (47 y.o. [name] Primary Care Provider: Dorinda Hill Other Clinician: Referring Provider: Treating Provider/Extender: Jana Hakim in Treatment: [phone] Verbal / Phone Orders: No Diagnosis Coding ICD-10 Coding Code Description [phone] Non-pressure chronic ulcer of other part of left foot with fat layer exposed Follow-up Appointments ppointment in 1 week. - Dr. Lady Gary Room 3 Return A Anesthetic Wound #2 Left Calcaneus (In clinic) Topical Lidocaine 4% applied to wound bed - In clinic Edema Control - Lymphedema / SCD / Other Bilateral Lower  Extremities Avoid standing for long periods of time. Moisturize legs daily. - as needed Off-Loading Other: - keep pressure off of the bottom of your feet. Globoped to left foot-when not wearing the cast Elevate legs throughout the day. Use the Shoe with the PegAssist off-loadinding insole Additional Orders / Instructions Follow Nutritious Diet Wound Treatment Wound #2 - Calcaneus Wound Laterality: Left Cleanser: Normal Saline (Generic) Every Other Day/30 Days Discharge Instructions: Cleanse the wound with Normal Saline prior to applying a clean dressing using gauze sponges, not tissue or cotton balls. Cleanser: Wound Cleanser (Generic) Every Other Day/30 Days Discharge Instructions: Cleanse the wound with wound cleanser prior to applying a clean dressing using gauze sponges, not tissue or cotton balls. Prim Dressing: Endoform 2x2 in (Generic) Every Other Day/30 Days ary Discharge Instructions: Moisten with saline or Hydrogel Secondary Dressing: Optifoam Non-Adhesive Dressing, 4x4 in (Generic) Every Other Day/30 Days Discharge Instructions: Apply over primary dressing as  directed. Secondary Dressing: Woven Gauze Sponge, Non-Sterile 4x4 in (Generic) Every Other Day/30 Days Discharge Instructions: Apply over primary dressing as directed. CROCKETT, RALLO ([id number]) [id number] Page 7 of 15 Secured With: 8M Medipore H Soft Cloth Surgical T ape, 4 x 10 (in/yd) (Generic) Every Other Day/30 Days Discharge Instructions: Secure with tape as directed. Electronic Signature(s) Signed: 09/01/2022 12:10:53 PM By: Fredirick Maudlin MD FACS Entered By: Fredirick Maudlin on 09/01/2022 10:40:33 -------------------------------------------------------------------------------- Problem List Details Patient Name: Date of Service: [name], [place] 09/01/2022 9:30 A M Medical Record Number: [id number] Patient Account Number: [id number] Date of Birth/Sex: Treating RN: [birth year] (47  y.o. M) Primary Care Provider: [name] Other Clinician: Referring Provider: Treating Provider/Extender: [name] in Treatment: 111 Active Problems ICD-10 Encounter Code Description Active Date MDM Diagnosis L97.522 Non-pressure chronic ulcer of other part of left foot with fat layer exposed 09/03/2020 No Yes Inactive Problems ICD-10 Code Description Active Date Inactive Date L97.512 Non-pressure chronic ulcer of other part of right foot with fat layer exposed 09/03/2020 09/03/2020 L89.893 Pressure ulcer of other site, stage 3 07/15/2020 07/15/2020 M62.81 Muscle weakness (generalized) 07/15/2020 07/15/2020 I10 Essential (primary) hypertension 07/15/2020 07/15/2020 M86.171 Other acute osteomyelitis, right ankle and foot 09/03/2020 09/03/2020 Resolved Problems Electronic Signature(s) Signed: 09/01/2022 10:37:47 AM By: Fredirick Maudlin MD FACS Entered By: Fredirick Maudlin on 09/01/2022 10:37:47 -------------------------------------------------------------------------------- Progress Note Details Patient Name: Date of Service: [name], [place] 09/01/2022 9:30 A [employer] ([id number]) [id number] Page 8 of 15 Medical Record Number: [id number] Patient Account Number: [id number] Date of Birth/Sex: Treating RN: 09-10-1975 (47 y.o. M) Primary Care Provider: [name] Other Clinician: Referring Provider: Treating Provider/Extender: [name] in Treatment: 111 Subjective Chief Complaint Information obtained from Patient Bilateral Plantar Foot Ulcers History of Present Illness (HPI) Wounds are12/03/2020 upon evaluation today patient presents for initial inspection here in our clinic concerning issues he has been having with the bottoms of his feet bilaterally. He states these actually occurred as wounds when he was hospitalized for 5 months secondary to Covid. He was apparently with tilting bed where he was  in an upright position quite frequently and apparently this occurred in some way shape or form during that time. Fortunately there is no sign of active infection at this time. No fevers, chills, nausea, vomiting, or diarrhea. With that being said he still has substantial wounds on the plantar aspects of his feet Theragen require quite a bit of work to get these to heal. He has been using Santyl currently though that is been problematic both in receiving the medication as well as actually paid for it as it is become quite expensive. Prior to the experience with Covid the patient really did not have any major medical problems other than hypertension he does have some mild generalized weakness following the Covid experience. 07/22/2020 on evaluation today patient appears to be doing okay in regard to his foot ulcers I feel like the wound beds are showing signs of better improvement that I do believe the Iodoflex is helping in this regard. With that being said he does have a lot of drainage currently and this is somewhat blue/green in nature which is consistent with Pseudomonas. I do think a culture today would be appropriate for [place] to evaluate and see if that is indeed the case I would likely start him on antibiotic orally as well he is not allergic to Cipro knows of no issues he has had in the  past 12/21; patient was admitted to the clinic earlier this month with bilateral presumed pressure ulcers on the bottom of his feet apparently related to excessive pressure from a tilt table arrangement in the intensive care unit. Patient relates this to being on ECMO but I am not really sure that is exactly related to that. I must say I have never seen anything like this. He has fairly extensive full-thickness wounds extending from his heel towards his midfoot mostly centered laterally. There is already been some healing distally. He does not appear to have an arterial issue. He has been using gentamicin to the  wound surfaces with Iodoflex to help with ongoing debridement 1/6; this is a patient with pressure ulcers on the bottom of his feet related to excessive pressure from a standing position in the intensive care unit. He is complaining of a lot of pain in the right heel. He is not a diabetic. 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 We have been using Iodoflex to help prepare the surfaces of both wounds for an advanced treatment product. He is nonambulatory spending most of his time in a wheelchair I have asked him not to propel the wheelchair with his heels 1/13; in general his wounds look better not much surface area change we have been using Iodoflex as of last week. I did an x-ray of the right heel as the patient was complaining of pain. I had some thoughts about a stress fracture perhaps Achilles tendon problems however what it showed was erosive changes along the inferior aspect of the calcaneus he now has a MRI booked for 1/20. 1/20; in general his wounds continue to be better. Some improvement in the large narrow areas proximally in his foot. He is still complaining of pain in the right heel and tenderness in certain areas of this wound. His MRI is tonight. I am not just looking for osteomyelitis that was brought up on the x-ray I am wondering about stress fractures, tendon ruptures etc. He has no such findings on the left. Also noteworthy is that the patient had critical illness neuropathy and some of the discomfort may be actual improvement in nerve function I am just not sure. These wounds were initially in the setting of severe critical illness related to COVID-19. He was put in a standing position. He may have also been on pressors at the point contributing to tissue ischemia. By his description at some point these wounds were grossly necrotic extending proximally up into the Achilles part of his heel. I do not know that I have ever really seen pictures of them like  this although they may exist in epic We have ordered Tri layer Oasis. I am trying to stimulate some granulation in these areas. This is of course assuming the MRI is negative for infection 1/27; since the patient was last here he saw [name] of infectious disease. He is planned for vancomycin and ceftriaxone. Prior operative culture grew MSSA. Also ordered baseline lab work. He also ordered arterial studies although the ABIs in our clinic were normal as well as his clinical exam these were normal I do not think he needs to see vascular surgery. His ABIs at the [facility] were 1.22 in the right triphasic waveforms with a normal TBI of 1.15 on the left ABI of 1.22 with triphasic waveforms and a normal TBI of 1.08. Finally he saw [name] who will follow him in for 2 months. At this point I do not  think he felt that he needed a procedure on the right calcaneal bone. [name] is elected for broad-spectrum antibiotic The patient is still having pain in the right heel. He walks with a walker 2/3; wounds are generally smaller. He is tolerating his IV antibiotics. I believe this is vancomycin and ceftriaxone. We are still waiting for Oasis burn in terms of his out-of-pocket max which he should be meeting soon given the IV antibiotics, MRIs etc. I have asked him to check in on this. We are using silver collagen in the meantime the wounds look better 2/10; tolerating IV vancomycin and Rocephin. We are waiting to apply for Oasis. Although I am not really sure where he is in his out-of-pocket max. 2/17 started the first application of Oasis trilayer. Still on antibiotics. The wounds have generally look better. The area on the left has a little more surface slough requiring debridement 2/24; second application of Oasis trilayer. The wound surface granulation is generally look better. The area on the left with undermining laterally I think is come in a bit. 10/08/2020 upon evaluation today patient is here today  for [employer] application #3. Fortunately he seems to be doing extremely well with regard to this and we are seeing a lot of new epithelial growth which is great news. Fortunately there is no signs of active infection at this time. 10/16/2020 upon evaluation today patient appears to be doing well with regard to his foot ulcers. Do believe the Oasis has been of benefit for him. I do not see any signs of infection right now which is great news and I think that he has a lot of new epithelial growth which is great to see as well. The patient is very pleased to hear all of this. I do think we can proceed with the Oasis trilayer #4 today. 3/18; not as much improvement in these areas on his heels that I was hoping. I did reapply trilateral Oasis today the tissue looks healthier but not as much fill in as I was hoping. 3/25; better looking today I think this is come in a bit the tissue looks healthier. Triple layer Oasis reapplied #6 4/1; somewhat better looking definitely better looking surface not as much change in surface area as I was hoping. He may be spending more time [place] on days then he needs to although he does have heel offloading boots. Triple layer Oasis reapplied #7 4/7; unfortunately apparently [facility] will not approve any further Oasis which is unfortunate since the patient did respond nicely both in terms of the condition of the wound bed as well as surface area. There is still some drainage coming from the wound but not a lot there does not appear to be any infection 4/15; we have been using Hydrofera Blue. He continues to have nice rims of epithelialization on the right greater than the left. The left the epithelialization is coming from the tip of his heel. There is moderate drainage. In this that concerns me about a total contact cast. There is no evidence of infection 4/29; patient has been using Hydrofera Blue with dressing changes. He has no complaints or issues  today. 5/5; using Hydrofera Blue. I actually think that he looks marginally better than the last time I saw this 3 weeks ago. There are rims of epithelialization on the BRANDT, CHANEY ([id number]) [id number] Page 9 of 15 left thumb coming from the medial side on the right. Using [facility] 5/12; using Hydrofera  Blue. These continue to make improvements in surface area. His drainage was not listed as severe I therefore went ahead and put a cast on the left foot. Right foot we will continue to dress his previous 5/16; back for first total contact cast change. He did not tolerate this particularly well cast injury on the anterior tibia among other issues. Difficulty sleeping. I talked him about this in some detail and afterwards is elected to continue. I told him I would like to have a cast on for 3 weeks to see if this is going to help at all. I think he agreed 5/19; I think the wound is better. There is no tunneling towards his midfoot. The undermining medially also looks better. He has a rim of new skin distally. I think we are making progress here. The area on the left also continues to look somewhat better to me using Hydrofera Blue. He has a list of complaints about the cast but none of them seem serious 5/26; patient presents for 1 week follow-up. He has been using a total contact cast and tolerating this well. Hydrofera Blue is the main dressing used. He denies signs of infection. 6/2 Hydrofera Blue total contact cast on the left. These were large ulcers that formed in intensive care unit where the patient was recovering from COVID. May have had something to do with being ventilated in an upright positiono Pressors etc. We have been able to get the areas down considerably and a viable surface. There is some epithelialization in both sides. Note made of drainage 6/9; changed to [facility] last time because of drainage. He arrives with better looking surfaces  and dimensions on the left than the right. Paradoxically the right actually probes more towards his midfoot the left is largely close down but both of these look improved. Using a total contact cast on the left 6/16; complex wounds on his bilateral plantar heels which were initially pressure injury from a stay in the ICU with COVID. We have been using silver alginate most recently. His dimensions of come in quite dramatically however not recently. We have been putting the left foot in a total contact cast 6/23; complex wounds on the bilateral plantar heels. I been putting the left in the cast paradoxically the area on the right is the one that is going towards closure at a faster rate. Quite a bit of drainage on the left. The patient went to see [name] who said he was going to standby for skin grafts. I had actually considered sending him for skin grafts however he would be mandatorily off his feet for a period of weeks to months. I am thinking that the area on the right is going to close on its own the area on the left has been more stubborn even though we have him in a total contact cast 6/30; took him out of a total contact cast last week is the right heel seem to be making better progress than the left where I was placing the cast. We are using silver alginate. Both wounds are smaller right greater than left 7/12; both wounds look as though they are making some progress. We are using silver alginate. Heel offloading boots 7/26; very gradual progress especially on the right. Using silver alginate. He is wearing heel offloading boots 8/18; he continues to close these wounds down very gradually. Using silver alginate. The problem polymen being definitive about this is areas of what appears to be callus around the  margins. This is not a 100% of the area but certainly sizable especially on the right 9/1; bilateral plantar feet wounds secondary to prolonged pressure while being ventilated for  COVID-19 in an upright position. Essentially pressure ulcers on the bottom of his feet. He is made substantial progress using silver alginate. 9/14; bilateral plantar feet wounds secondary to prolonged pressure. Making progress using silver alginate. 9/29 bilateral plantar feet wounds secondary to prolonged pressure. I changed him to Iodoflex last week. MolecuLight showing reddened blush fluorescence 10/11; patient presents for follow-up. He has no issues or complaints today. He denies signs of infection. He continues to use Iodoflex and antibiotic ointment to the wound beds. 10/27; 2-week follow-up. No evidence of infection. He has callus and thick dry skin around the wound margins we have been using Iodoflex and Bactroban which was in response to a moderate left MolecuLight reddish blush fluorescence. 11/10; 2-week follow-up. Wound margins again have thick callus however the measurements of the actual wound sites are a lot smaller. Everything looks reasonably healthy here. We have been using Iodoflex He was approved for prime matrix but I have elected to delay this given the improvement in the surface area. Hopefully I will not regret that decision as were getting close to the end of the year in terms of insurance payment 12/8; 2-week follow-up. Wounds are generally smaller in size. These were initially substantial wounds extending into the forefoot all the way into the heel on the bilateral plantar feet. They are now both located on the plantar heel distal aspect both of these have a lot of callus around the wounds I used a #5 curette to remove this on the right and the left also some subcutaneous debris to try and get the wound edges were using Iodoflex. He has heel offloading shoe 12/22; 2-week follow-up. Not really much improvement. He has thick callus around the outer edges of both wounds. I remove this there is some nonviable subcutaneous tissue as well. We have been using Iodoflex. Her  intake nurse and myself spontaneously thought of a total contact cast I went back in May. At that time we really were not seeing much of an improvement with a cast although the wound was in a much different situation I would like to retry this in 2 weeks and I discussed this with the patient 08/12/2021; the patient has had some improvement with the Iodoflex. The the area on the left heel plantar more improved than the right. I had to put him in a total contact cast on the left although I decided to put that off for 2 weeks. I am going to change his primary dressing to silver collagen. I think in both areas he has had some improvement most of the healing seems to be more proximal in the heel. The wounds are in the mid aspect. A lot of thick callus on the right heel however. 1/19; we are using silver collagen on both plantar heel areas. He has had some improvement today. The left did not require any debridement. He still had some eschar on the right that was debrided but both seem to have contracted. I did not put it total contact cast on him today 2/2 we have been using silver collagen. The area on the right plantar heel has areas that appear to be epithelialized interspersed with dry flaking callus and dry skin. I removed this. This really looks better than on the other side. On the left still a large area with raised  edges and debris on the surface. 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 He has no issues or complaints today. 2/9; not much change in the left foot wound with 1 week of a cast we are using silver collagen. Silver collagen on the right side. The right side has been the better wound surface. We will reapply the total contact cast on the left 2/16; not much improvement on either side I been using silver collagen with a total contact cast on the left. I'm  changing the Hydrofera Blue still with a total contact cast on the left 2/23; some improvement on both sides. Disappointing that he has thick callus around the area that we are putting in a total contact cast on the left. We've been using Hydrofera Blue on both wound areas. This is a man who at essentially pressure ulcers in addition to ischemia caused by medications to support his blood pressure (pressors) in the ICU. He was being ventilated in the standing position for severe Covid. A Shiley the wounds extended across his entire foot but are now localized to his plantar heels bilaterally. We have made progress however neither areas healed. I continue to think the total contact cast is helped albeit painstakingly slowly. He has never wanted a plastic surgery consult although I don't know that they would be interested in grafting in area in this location. 10/07/2021: Continued improvement bilaterally. There is still some callus around the left wound, despite the total contact cast. He has some increased pain in his right midfoot around 1 particular area. This has been painful in the past but seems to be a little bit worse. When his cast was removed today, there was an area on the heel of the left foot that looks a bit macerated. He is also complaining of pain in his left thigh and hip which he thinks is secondary to the limb length discrepancy caused by the cast. 10/14/2021: He continues to improve. A little bit less callus around the left wound. He continues to endorse pain in his right midfoot, but this is not as significant as it was last week. The maceration on his left heel is improved. 10/21/2021: Continued improvement to both wounds. The maceration on his left heel is no longer evident. Less callus bilaterally. Epithelialization progressing. 10/28/2021: Significant improvement this week. The right sided wound is nearly closed with just a small open area at the middle. No maceration seen on the  left heel. Continued epithelialization on both sides. No concern for infection. 11/04/2021: Today, the wounds were measured a little bit differently and come out as larger, but I actually think they are about the same to potentially even [name], [name] ([id number]) [id number] Page 10 of 15 smaller, particularly on the left. He continues to accumulate some callus on the right. 11/11/2021: T oday, the patient is expressing some concern that the left wound, despite being in the total contact cast, is not progressing at the same rate as the 1 on the right. He is interested in trying a week without the cast to see how the wound does. The wounds are roughly the same size as last week, with the right perhaps being a little bit smaller. He continues to build up callus on both sites. 11/18/2021: Last week, I permitted the patient to go without his total contact cast, just to see if the  cast was really making any difference. Today, both wounds have deteriorated to some extent, suggesting that the cast is providing benefit, at least on the left. Both are larger and have accumulated callus, slough, and other debris. 11/26/2021: I debrided both wounds quite aggressively last week in an effort to stimulate the healing cascade. This appears to have been effective as the left sided wound is a full centimeter shorter in length. Although the right was measured slightly larger, on inspection, it looks as though an area of epithelialized tissue was included in the measurements. We have been using PolyMem Ag on the wound surfaces with a total contact cast to the left. 12/02/2021: It appears that the intake personnel are including epithelialized tissue in his wound measurements; the right wound is almost completely epithelialized; there is just a crater at the proximal midfoot with some open areas. On the left, he has built up some callus, but the overall wound surface looks good. There is some  senescent skin around the wound margin. He has been in PolyMem Ag bilaterally with a total contact cast on the left. 12/09/2021: The right wound is nearly closed; there is just a small open area at the mid calcaneus. On the left, the wound is smaller with minimal callus buildup. No significant drainage. 12/16/2021: The right calcaneal wound remains minimally open at the mid calcaneus; the rest has epithelialized. On the left, the wound is also a little bit smaller. There is some senescent tissue on the periphery. He is getting his first application of a trial skin substitute called Vendaje today. 12/23/2021: The wound on his right calcaneus is nearly closed; there is just a small area at the most distal aspect of the calcaneus that is open. On the left, the area where we applied to the skin substitute has a healthier-looking bed of granulation tissue. The wound dimensions are not significantly different on this side but the wound surface is improved. 12/30/2021: The wound on the right calcaneus has not changed significantly aside from some accumulation of callus. On the left, the open area is smaller and continues to have an improved surface. He continues to accumulate callus around the wound. He is here for his third application of Vendaje. 01/06/2022: The right calcaneal wound is down to just a couple of millimeters. He continues to accumulate periwound callus. He unfortunately got his cast wet earlier in the week and his left foot is macerated, resulting in some superficial skin loss just distal to the open wound. The open wound itself, however, is much smaller and has a healthier appearing surface. He is here for his fourth application of Vendaje. 01/13/2022: The right calcaneal wound is about the same. Unfortunately, once again, his cast got wet and his foot is again macerated. This is caused the left calcaneal wound to enlarge. He is here for his fifth application of Vendaje. 01/20/2022: The right  calcaneal wound is very small. There is some periwound callus accumulation. He purchased a new cast protector last week and this has been effective in avoiding the maceration that has been occurring on the left. The left calcaneal wound is narrower and has a healthy and viable-appearing surface. He is here for his 6 application of Vendaje. 01/27/2022: The right calcaneal wound is down to just a pinhole. There is some periwound slough and callus. On the left, the wound is narrower and shorter by about a centimeter. The surface is robust and viable-appearing. Unfortunately, the rep for the trial skin substitute product did not  provide any for [place] to use today. 02/04/2022: The right calcaneal wound remains unchanged. There is more accumulated callus. On the left, although the intake nurse measured it a little bit longer, it looks about the same to me. The surface has a layer of slough, but underneath this, there is good granulation tissue. 02/10/2022: The right calcaneus wound is nearly closed. There is still some callus that builds up around the site. The left side looks about the same in terms of dimensions, but the surface is more robust and vital-appearing. 02/16/2022: The area of the right calcaneus that was nearly closed last week has closed, but there is a small opening at the mid foot where it looks like some moisture got retained and caused some reopening. The left foot wound is narrower and shallower. Both sites have a fair amount of periwound callus and eschar. 02/24/2022: The small midfoot opening on the right calcaneus is a little bit smaller today. The left foot wound is narrower and shallower. He continues to accumulate periwound callus. No concern for infection. 03/01/2022: The patient came to clinic early because he showered and got his cast wet. Fortunately, there is no significant maceration to his foot but the callus softened and it looks like the wound on his left calcaneus may be a little  bit wider. The wound on his right calcaneus is just a narrow slit. Continued accumulation of periwound callus bilaterally. 03/08/2022: The wound on his right calcaneus is very nearly closed, just a small pinpoint opening under a bit of eschar; the left wound has come in quite a bit, as well. It is narrower and shorter than at our last visit. Still with accumulated callus and eschar bilaterally. 03/17/2022: The right calcaneal wound is healed. The left wound is smaller and the surface itself is very clean, but there is some blue-green staining on the periwound callus, concerning for Pseudomonas aeruginosa. 03/23/2022: The right calcaneal wound remains closed. The left wound continues to contract. No further blue-green staining. Small amount of callus and slough accumulation. 03/28/2022: He came in early today because he had gotten his cast wet. On inspection, the wound itself did not get wet or macerated, just a little bit of the forefoot. The wound itself is basically unchanged. 04/07/2022: The right foot wound remains closed. The left wound is the smallest that I have seen it to date. It is narrower and shorter. It still continues to accumulate slough on the surface. 04/15/2022: There is a band of epithelium now dividing the small left plantar foot wound in 2. There is still some slough on the surface. 04/21/2022: The wound continues to narrow. Just a little bit of slough on the surface. He seems to be responding well to endoform. 04/28/2022: Continued slow contraction of the wound. There is a little slough on the surface and some periwound callus. We have been using endoform and total contact cast. 05/05/2022: The wound appears to have stalled. There is slough and some periwound eschar/callus. No concern for infection, however. 05/12/2022: Unfortunately, his right foot has reopened. It is located at the most posterior aspect of his surgical incision. The area was noted to have drainage coming from it when  his padding was removed today. Underneath some callus and senescent skin, there is an opening. No purulent drainage or malodor. On the left foot, the wound is again unchanged. There is some light blue staining on the callus, but no malodor or purulent drainage. 10/13; right and left heel remanence of extensive plantar foot  wounds. These are better than I remember by quite a big margin however he is still left with wounds on the left plantar heel and the right plantar heel. Been using endoform bilaterally. A culture was done that showed apparently Pseudomonas but we are [name], [name] ([id number]) [id number] Page 11 of 15 still waiting for the [facility] antibiotic to use gentamicin today. He is still very active by description I am not sure about the offloading of his noncasted right foot 10/20; both wounds right and left heel debrided not much change from last week. [name] Echevaria has arrived which is linezolid, gentamicin and ciprofloxacin we will use this with endoform. T contact cast on the left otal 06/02/2022: Both wounds are smaller today. There is still a fair amount of callus buildup around the right foot ulcer. The left is more superficial and nearly flush with the surrounding tissues. Also with slough and eschar buildup. 06/10/2022: The right sided wound appears to be nearly closed, if not completely so, although it is somewhat difficult to tell given the abnormal tissue and scarring in his foot. There is a fair amount of callus and crust accumulation. On the left, the wound looks about the same, again with callus and slough. He has an appointment next Thursday with [name] in podiatry; I am hopeful that there may be some reconstructive options available for Mr. Sweigert. 06/16/2022: Both wounds have some eschar and callus accumulation. The right sided wound is extremely narrow and barely open; the left is narrower than last week. There is a little bit of slough.  He has his appointment in podiatry later today. 06/23/2022: The patient met with [name] last week and unfortunately, there are no reconstructive options that he believes would be helpful. He did order an MRI to evaluate for osteomyelitis and fortunately, none was seen. The left sided wound is a little bit shorter and narrower today. The right sided wound is about the same. There is callus and eschar accumulation bilaterally. 06/29/2022: Both feet have improved from last week. There is epithelium making a valiant effort to creep across the surface on the left. The right side looks like it got a little dry and the deep crevasse in his midfoot has cracked. Both have eschar and there is some slough on the left. 07/07/2022: Both feet have improved. There is epithelium completely covering the calcaneus on the right with just a small opening in the crevasse in his midfoot. On the left, the open area of tissue is smaller but he continues to build up callus/eschar and slough. 07/15/2022: The opening in the midfoot on the right is about the same size, covered with eschar and a little bit of slough. The open portion of the left wound is narrower and shorter with a bit of slough buildup. He admits to being on his feet more than recommended. 12/14; as far as I can tell everything on the right foot is closed. There is some eschar I removed some of this I cannot identify any open wound here. As usual this will be a very vulnerable area going forward. On the left this looks really quite healthy. I was pleasantly surprised to see how good this looked. Wound is certainly smaller and there appears to be healthy epithelialization. He has been using Promogran on the right and endoform on the left. He has been offloading the right foot with a heel offloading boot and he has a running shoe on the right foot 08/04/2022: The right foot remains closed. He  has a thick cushioned insole in his sneaker. The left sided wound  is smaller with just some slough and eschar accumulation. He is wearing the heel off loader on this foot. 08/15/2022: The right foot remains closed. The left wound has narrowed further. There is some slough and eschar accumulation. 08/25/2022: We put him in a peg assist shoe insert and as a result, he has more epithelialization of the ulcer with minimal slough and eschar accumulation. 09/01/2022: The wound is smaller by about half this week. Still with some slough on the surface. The peg assist shoe seems to be doing a remarkable job of adequately offloading the site. Patient History Information obtained from Patient. Family History Cancer - Maternal Grandparents, Diabetes - Father,Paternal Grandparents, Heart Disease - Maternal Grandparents, Hypertension - Father,Paternal Grandparents, Lung Disease - Siblings, No family history of Hereditary Spherocytosis, Kidney Disease, Seizures, Stroke, Thyroid Problems, Tuberculosis. Social History Never smoker, Marital Status - Married, Alcohol Use - Never, Drug Use - No History, Caffeine Use - Daily - tea, soda. Medical History Eyes Denies history of Cataracts, Glaucoma, Optic Neuritis Ear/Nose/Mouth/Throat Denies history of Chronic sinus problems/congestion, Middle ear problems Hematologic/Lymphatic Denies history of Anemia, Hemophilia, Human Immunodeficiency Virus, Lymphedema, Sickle Cell Disease Respiratory Patient has history of Asthma Denies history of Aspiration, Chronic Obstructive Pulmonary Disease (COPD), Pneumothorax, Sleep Apnea, Tuberculosis Cardiovascular Patient has history of Angina - with COVID, Hypertension Denies history of Arrhythmia, Congestive Heart Failure, Coronary Artery Disease, Deep Vein Thrombosis, Hypotension, Myocardial Infarction, Peripheral Arterial Disease, Peripheral Venous Disease, Phlebitis, Vasculitis Gastrointestinal Denies history of Cirrhosis , Colitis, Crohnoos, Hepatitis A, Hepatitis B, Hepatitis  C Endocrine Denies history of Type I Diabetes, Type II Diabetes Genitourinary Denies history of End Stage Renal Disease Immunological Denies history of Lupus Erythematosus, Raynaudoos, Scleroderma Integumentary (Skin) Denies history of History of Burn Musculoskeletal Denies history of Gout, Rheumatoid Arthritis, Osteoarthritis, Osteomyelitis Neurologic Denies history of Dementia, Neuropathy, Quadriplegia, Paraplegia, Seizure Disorder Oncologic Denies history of Received Chemotherapy, Received Radiation Psychiatric Denies history of Anorexia/bulimia, Confinement Anxiety Hospitalization/Surgery History - COVID PNA 07/22/2019- 11/14/2019. - 03/27/2020 wound debridement/ skin graft. [name], [name] ([id number]) [id number] Page 12 of 15 Medical A Surgical History Notes nd Constitutional Symptoms (General Health) COVID PNA 07/22/2019-11/14/2019 [facility] ECMO, foot drop left foot , Genitourinary kidney stone Psychiatric anxiety Objective Constitutional no acute distress. Vitals Time Taken: 9:50 AM, Height: 69 in, Weight: 280 lbs, BMI: 41.3, Temperature: 98.4 F, Pulse: 84 bpm, Respiratory Rate: 20 breaths/min, Blood Pressure: 138/84 mmHg. Respiratory Normal work of breathing on room air. General Notes: 09/01/2022: The wound is smaller by about half this week. Still with some slough on the surface. Integumentary (Hair, Skin) Wound #2 status is Open. Original cause of wound was Pressure Injury. The date acquired was: 10/07/2019. The wound has been in treatment 111 weeks. The wound is located on the Left Calcaneus. The wound measures 0.1cm length x 0.1cm width x 0.1cm depth; 0.008cm^2 area and 0.001cm^3 volume. There is Fat Layer (Subcutaneous Tissue) exposed. There is no tunneling or undermining noted. There is a medium amount of serosanguineous drainage noted. The wound margin is thickened. There is large (67-100%) red, pink granulation within the wound bed. There  is a small (1-33%) amount of necrotic tissue within the wound bed including Eschar and Adherent Slough. The periwound skin appearance had no abnormalities noted for color. The periwound skin appearance exhibited: Callus, Scarring, Maceration. Periwound temperature was noted as No Abnormality. Assessment Active Problems ICD-10 Non-pressure chronic ulcer of other part of left foot  with fat layer exposed Procedures Wound #2 Pre-procedure diagnosis of Wound #2 is a Pressure Ulcer located on the Left Calcaneus . There was a Selective/Open Wound Non-Viable Tissue Debridement with a total area of 0.01 sq cm performed by [name], MD. With the following instrument(s): Curette to remove Non-Viable tissue/material. Material removed includes [facility] after achieving pain control using Lidocaine 5% topical ointment. No specimens were taken. A time out was conducted at 10:30, prior to the start of the procedure. A Minimum amount of bleeding was controlled with Pressure. The procedure was tolerated well with a pain level of 0 throughout and a pain level of 0 following the procedure. Post Debridement Measurements: 0.1cm length x 0.1cm width x 0.1cm depth; 0.001cm^3 volume. Post debridement Stage noted as Category/Stage III. Character of Wound/Ulcer Post Debridement is improved. Post procedure Diagnosis Wound #2: Same as Pre-Procedure General Notes: Scribed for Dr. Lady Gary by J.Scotton. Plan Follow-up Appointments: Return Appointment in 1 week. - Dr. Lady Gary Room 3 Anesthetic: Wound #2 Left Calcaneus: (In clinic) Topical Lidocaine 4% applied to wound bed - In clinic Edema Control - Lymphedema / SCD / Other: Avoid standing for long periods of time. Moisturize legs daily. - as needed Off-Loading: Other: - keep pressure off of the bottom of your feet. Globoped to left foot-when not wearing the cast Elevate legs throughout the day. Use the Shoe with the PegAssist off-loadinding insole Additional Orders  / Instructions: DQUAN, [name] ([id number]) [id number] Page 13 of 15 Follow Nutritious Diet WOUND #2: - Calcaneus Wound Laterality: Left Cleanser: Normal Saline (Generic) Every Other Day/30 Days Discharge Instructions: Cleanse the wound with Normal Saline prior to applying a clean dressing using gauze sponges, not tissue or cotton balls. Cleanser: Wound Cleanser (Generic) Every Other Day/30 Days Discharge Instructions: Cleanse the wound with wound cleanser prior to applying a clean dressing using gauze sponges, not tissue or cotton balls. Prim Dressing: Endoform 2x2 in (Generic) Every Other Day/30 Days ary Discharge Instructions: Moisten with saline or Hydrogel Secondary Dressing: Optifoam Non-Adhesive Dressing, 4x4 in (Generic) Every Other Day/30 Days Discharge Instructions: Apply over primary dressing as directed. Secondary Dressing: Woven Gauze Sponge, Non-Sterile 4x4 in (Generic) Every Other Day/30 Days Discharge Instructions: Apply over primary dressing as directed. Secured With: 22M Medipore H Soft Cloth Surgical T ape, 4 x 10 (in/yd) (Generic) Every Other Day/30 Days Discharge Instructions: Secure with tape as directed. 09/01/2022: The wound is smaller by about half this week. Still with some slough on the surface. I used a curette to debride the slough from the wound surface. We will continue endoform. He will continue to protect and pad his right foot to avoid reopening the wound there. Continue peg assist shoe on the left for offloading. Follow-up in 1 week. Electronic Signature(s) Signed: 09/01/2022 10:41:10 AM By: [name] MD FACS Entered By: [name] on 09/01/2022 10:41:10 -------------------------------------------------------------------------------- HxROS Details Patient Name: Date of Service: [name], [name] 09/01/2022 9:30 A M Medical Record Number: [id number] Patient Account Number: [id number] Date of Birth/Sex: Treating  RN: 1976/02/10 (47 y.o. M) Primary Care Provider: Dorinda Hill Other Clinician: Referring Provider: Treating Provider/Extender: Jana Hakim in Treatment: 111 Information Obtained From Patient Constitutional Symptoms (General Health) Medical History: Past Medical History Notes: COVID PNA 07/22/2019-11/14/2019 VENT ECMO, foot drop left foot , Eyes Medical History: Negative for: Cataracts; Glaucoma; Optic Neuritis Ear/Nose/Mouth/Throat Medical History: Negative for: Chronic sinus problems/congestion; Middle ear problems Hematologic/Lymphatic Medical History: Negative for: Anemia; Hemophilia; Human Immunodeficiency Virus; Lymphedema; Sickle Cell  Disease Respiratory Medical History: Positive for: Asthma Negative for: Aspiration; Chronic Obstructive Pulmonary Disease (COPD); Pneumothorax; Sleep Apnea; Tuberculosis Cardiovascular Medical History: Positive for: Angina - with COVID; Hypertension [name], [name] ([id number]) [id number] Page 14 of 15 Negative for: Arrhythmia; Congestive Heart Failure; Coronary Artery Disease; Deep Vein Thrombosis; Hypotension; Myocardial Infarction; Peripheral Arterial Disease; Peripheral Venous Disease; Phlebitis; Vasculitis Gastrointestinal Medical History: Negative for: Cirrhosis ; Colitis; Crohns; Hepatitis A; Hepatitis B; Hepatitis C Endocrine Medical History: Negative for: Type I Diabetes; Type II Diabetes Genitourinary Medical History: Negative for: End Stage Renal Disease Past Medical History Notes: kidney stone Immunological Medical History: Negative for: Lupus Erythematosus; Raynauds; Scleroderma Integumentary (Skin) Medical History: Negative for: History of Burn Musculoskeletal Medical History: Negative for: Gout; Rheumatoid Arthritis; Osteoarthritis; Osteomyelitis Neurologic Medical History: Negative for: Dementia; Neuropathy; Quadriplegia; Paraplegia; Seizure  Disorder Oncologic Medical History: Negative for: Received Chemotherapy; Received Radiation Psychiatric Medical History: Negative for: Anorexia/bulimia; Confinement Anxiety Past Medical History Notes: anxiety Immunizations Pneumococcal Vaccine: Received Pneumococcal Vaccination: No Implantable Devices None Hospitalization / Surgery History Type of Hospitalization/Surgery COVID PNA 07/22/2019- 11/14/2019 03/27/2020 wound debridement/ skin graft Family and Social History Cancer: Yes - Maternal Grandparents; Diabetes: Yes - Father,Paternal Grandparents; Heart Disease: Yes - Maternal Grandparents; Hereditary Spherocytosis: No; Hypertension: Yes - Father,Paternal Grandparents; Kidney Disease: No; Lung Disease: Yes - Siblings; Seizures: No; Stroke: No; Thyroid Problems: No; Tuberculosis: No; Never smoker; Marital Status - Married; Alcohol Use: Never; Drug Use: No History; Caffeine Use: Daily - tea, soda; Financial Concerns: No; Food, Clothing or Shelter Needs: No; Support System Lacking: No; Transportation Concerns: No Electronic Signature(s) Signed: 09/01/2022 12:10:53 PM By: [name] MD FACS Entered By: [name] on 09/01/2022 10:38:56 [name] ([id number]) [id number] Page 15 of 15 -------------------------------------------------------------------------------- SuperBill Details Patient Name: Date of Service: [name] 09/01/2022 Medical Record Number: [id number] Patient Account Number: [id number] Date of Birth/Sex: Treating RN: 1976-07-29 (47 y.o. M) Primary Care Provider: Dorinda Hill Other Clinician: Referring Provider: Treating Provider/Extender: Jana Hakim in Treatment: 111 Diagnosis Coding ICD-10 Codes Code Description [phone] Non-pressure chronic ulcer of other part of left foot with fat layer exposed Facility Procedures : CPT4 Code: [id number] Description: 97597 - DEBRIDE WOUND 1ST 20 SQ CM  OR < ICD-10 Diagnosis Description L97.522 Non-pressure chronic ulcer of other part of left foot with fat layer exposed Modifier: Quantity: 1 Physician Procedures : CPT4 Code Description Modifier [id number] - WC PHYS LEVEL 3 - EST PT 25 ICD-10 Diagnosis Description L97.522 Non-pressure chronic ulcer of other part of left foot with fat layer exposed Quantity: 1 : [id number] - WC PHYS DEBR WO ANESTH 20 SQ CM ICD-10 Diagnosis Description L97.522 Non-pressure chronic ulcer of other part of left foot with fat layer exposed Quantity: 1 Electronic Signature(s) Signed: 09/01/2022 10:41:27 AM By: [name] MD FACS Entered By: [name] on 09/01/2022 10:41:27

## 2022-09-01 NOTE — Progress Notes (Addendum)
NICOLO, TOMKO ([id number]) [id number] Page 1 of 7 Visit Report for 09/01/2022 Arrival Information Details Patient Name: Date of Service: [name], [place] 09/01/2022 9:30 A M Medical Record Number: [id number] Patient Account Number: [id number] Date of Birth/Sex: Treating RN: May 17, 1976 (47 y.o. M) Primary Care Joury Allcorn: [name] Other Clinician: Referring Normagene Harvie: Treating Opal Mckellips/Extender: [name] in Treatment: 33 Visit Information History Since Last Visit All ordered tests and consults were completed: No Patient Arrived: Wheel Chair Added or deleted any medications: No Arrival Time: 09:50 Any new allergies or adverse reactions: No Accompanied By: wife Had a fall or experienced change in No Transfer Assistance: None activities of daily living that may affect Patient Identification Verified: Yes risk of falls: Secondary Verification Process Completed: Yes Signs or symptoms of abuse/neglect since last visito No Patient Requires Transmission-Based Precautions: No Hospitalized since last visit: No Patient Has Alerts: No Implantable device outside of the clinic excluding No cellular tissue based products placed in the center since last visit: Pain Present Now: No Electronic Signature(s) Signed: 09/01/2022 10:01:21 AM By: Worthy Rancher Entered By: Worthy Rancher on 09/01/2022 09:50:42 -------------------------------------------------------------------------------- Encounter Discharge Information Details Patient Name: Date of Service: [name], [place] E. 09/01/2022 9:30 A M Medical Record Number: [id number] Patient Account Number: [id number] Date of Birth/Sex: Treating RN: Apr 30, 1976 (47 y.o. [name] Primary Care Zaylan Kissoon: [name] Other Clinician: Referring Sharaine [name]: Treating Neysha Criado/Extender: [name] in Treatment: 111 Encounter Discharge Information Items Post Procedure  Vitals Discharge Condition: Stable Temperature (F): 98.4 Ambulatory Status: Wheelchair Pulse (bpm): 84 Discharge Destination: Home Respiratory Rate (breaths/min): 20 Transportation: Private Auto Blood Pressure (mmHg): 138/82 Accompanied By: self Schedule Follow-up Appointment: Yes Clinical Summary of Care: Patient Declined Electronic Signature(s) Signed: 09/01/2022 12:11:12 PM By: [name] Entered By: [name] on 09/01/2022 10:36:29 [name] ([id number]) [id number] Page 2 of 7 -------------------------------------------------------------------------------- Lower Extremity Assessment Details Patient Name: Date of Service: [name] 09/01/2022 9:30 A M Medical Record Number: [id number] Patient Account Number: [id number] Date of Birth/Sex: Treating RN: Aug 24, 1975 (47 y.o. [name] Primary Care Erandi Lemma: [name] Other Clinician: Referring Janae Bonser: Treating Harlee Pursifull/Extender: [name] in Treatment: 111 Edema Assessment Assessed: [name]: No] [name]: No] Edema: [Left: N] [Right: o] Calf Left: Right: Point of Measurement: 29 cm From Medial Instep 44.1 cm 44.5 cm Ankle Left: Right: Point of Measurement: 9 cm From Medial Instep 24.7 cm 25 cm Vascular Assessment Pulses: Dorsalis Pedis Palpable: [Left:Yes] Electronic Signature(s) Signed: 09/01/2022 12:11:12 PM By: [name] Entered By: [name] on 09/01/2022 10:22:21 -------------------------------------------------------------------------------- Multi Wound Chart Details Patient Name: Date of Service: [name], [name] 09/01/2022 9:30 A M Medical Record Number: [id number] Patient Account Number: [id number] Date of Birth/Sex: Treating RN: 10/20/1975 (47 y.o. M) Primary Care Lorenza Winkleman: [name] Other Clinician: Referring Jamea Robicheaux: Treating Selyna Klahn/Extender: [name] in Treatment: 111 Vital  Signs Height(in): 69 Pulse(bpm): 84 Weight(lbs): 280 Blood Pressure(mmHg): 138/84 Body Mass Index(BMI): 41.3 Temperature(F): 98.4 Respiratory Rate(breaths/min): 20 [2:Photos:] [N/A:N/A] Left Calcaneus N/A N/A Wound Location: Pressure Injury N/A N/A Wounding Event: Pressure Ulcer N/A N/A Primary Etiology: Asthma, Angina, Hypertension N/A N/A Comorbid History: 10/07/2019 N/A N/A Date Acquired: 111 N/A N/A Weeks of Treatment: Open N/A N/A Wound Status: No N/A N/A Wound Recurrence: 0.1x0.1x0.1 N/A N/A Measurements L x W x D (cm) 0.008 N/A N/A A (cm) : rea 0.001 N/A N/A Volume (cm) : 100.00% N/A N/A % Reduction  in A rea: 100.00% N/A N/A % Reduction in Volume: Category/Stage III N/A N/A Classification: Medium N/A N/A Exudate A mount: Serosanguineous N/A N/A Exudate Type: red, brown N/A N/A Exudate Color: Thickened N/A N/A Wound Margin: Large (67-100%) N/A N/A Granulation A mount: Red, Pink N/A N/A Granulation Quality: Small (1-33%) N/A N/A Necrotic A mount: Eschar, Adherent Slough N/A N/A Necrotic Tissue: Fat Layer (Subcutaneous Tissue): Yes N/A N/A Exposed Structures: Fascia: No Tendon: No Muscle: No Joint: No Bone: No Medium (34-66%) N/A N/A Epithelialization: Debridement - Selective/Open Wound N/A N/A Debridement: Pre-procedure Verification/Time Out 10:30 N/A N/A Taken: Lidocaine 5% topical ointment N/A N/A Pain Control: Slough N/A N/A Tissue Debrided: Non-Viable Tissue N/A N/A Level: 0.01 N/A N/A Debridement A (sq cm): rea Curette N/A N/A Instrument: Minimum N/A N/A Bleeding: Pressure N/A N/A Hemostasis A chieved: 0 N/A N/A Procedural Pain: 0 N/A N/A Post Procedural Pain: Procedure was tolerated well N/A N/A Debridement Treatment Response: 0.1x0.1x0.1 N/A N/A Post Debridement Measurements L x W x D (cm) 0.001 N/A N/A Post Debridement Volume: (cm) Category/Stage III N/A N/A Post Debridement Stage: Callus: Yes N/A  N/A Periwound Skin Texture: Scarring: Yes Maceration: Yes N/A N/A Periwound Skin Moisture: No Abnormalities Noted N/A N/A Periwound Skin Color: No Abnormality N/A N/A Temperature: Debridement N/A N/A Procedures Performed: Treatment Notes Wound #2 (Calcaneus) Wound Laterality: Left Cleanser Normal Saline Discharge Instruction: Cleanse the wound with Normal Saline prior to applying a clean dressing using gauze sponges, not tissue or cotton balls. Wound Cleanser Discharge Instruction: Cleanse the wound with wound cleanser prior to applying a clean dressing using gauze sponges, not tissue or cotton balls. Peri-Wound Care Topical Primary Dressing Endoform 2x2 in Discharge Instruction: Moisten with saline or Hydrogel Secondary Dressing Optifoam Non-Adhesive Dressing, 4x4 in Discharge Instruction: Apply over primary dressing as directed. Woven Gauze Sponge, Non-Sterile 4x4 in Discharge Instruction: Apply over primary dressing as directed. Secured With 57M Medipore H Soft Cloth Surgical T ape, 4 x 10 (in/yd) Discharge Instruction: Secure with tape as directed. ADONI, GREENOUGH ([id number]) [id number] Page 4 of 7 Compression Wrap Compression Stockings Add-Ons Electronic Signature(s) Signed: 09/01/2022 10:38:00 AM By: [name] MD FACS Entered By: [name] on 09/01/2022 10:38:00 -------------------------------------------------------------------------------- Multi-Disciplinary Care Plan Details Patient Name: Date of Service: [name], TO [name] 09/01/2022 9:30 A M Medical Record Number: [id number] Patient Account Number: [id number] Date of Birth/Sex: Treating RN: [birth year] (47 y.o. [name] Primary Care Sitara Cashwell: Dorinda Hill Other Clinician: Referring Labrina Lines: Treating Jamae Tison/Extender: Jana Hakim in Treatment: 111 Multidisciplinary Care Plan reviewed with physician Active Inactive Wound/Skin  Impairment Nursing Diagnoses: Impaired tissue integrity Knowledge deficit related to ulceration/compromised skin integrity Goals: Patient/caregiver will verbalize understanding of skin care regimen Date Initiated: 07/15/2020 Target Resolution Date: 03/08/2023 Goal Status: Active Ulcer/skin breakdown will have a volume reduction of 30% by week 4 Date Initiated: 07/15/2020 Date Inactivated: 08/20/2020 Target Resolution Date: 09/03/2020 Goal Status: Unmet Unmet Reason: no major changes. Ulcer/skin breakdown will heal within 14 weeks Date Initiated: 12/04/2020 Date Inactivated: 12/10/2020 Target Resolution Date: 12/10/2020 Unmet Reason: wounds still open at 14 Goal Status: Unmet weeks and today 21 weeks. Interventions: Assess patient/caregiver ability to obtain necessary supplies Assess patient/caregiver ability to perform ulcer/skin care regimen upon admission and as needed Assess ulceration(s) every visit Provide education on ulcer and skin care Treatment Activities: Skin care regimen initiated : 07/15/2020 Topical wound management initiated : 07/15/2020 Notes: Electronic Signature(s) Signed: 09/01/2022 12:11:12 PM By: [name] RN Entered By: [name] on  09/01/2022 10:22:54 GLYNN, YEPES ([id number]) [id number] Page 5 of 7 -------------------------------------------------------------------------------- Pain Assessment Details Patient Name: Date of Service: [name] 09/01/2022 9:30 A M Medical Record Number: [id number] Patient Account Number: [id number] Date of Birth/Sex: Treating RN: May 22, 1976 (47 y.o. M) Primary Care Clarene Curran: Dorinda Hill Other Clinician: Referring Zared Knoth: Treating Taliyah Watrous/Extender: Jana Hakim in Treatment: 111 Active Problems Location of Pain Severity and Description of Pain Patient Has Paino No Site Locations Pain Management and Medication Current Pain Management: Electronic  Signature(s) Signed: 09/01/2022 10:01:21 AM By: Dayton Scrape Entered By: Dayton Scrape on 09/01/2022 09:52:01 -------------------------------------------------------------------------------- Patient/Caregiver Education Details Patient Name: Date of Service: Mart Piggs 1/25/2024andnbsp9:30 A M Medical Record Number: [id number] Patient Account Number: [id number] Date of Birth/Gender: Treating RN: 12-31-1975 (47 y.o. [name] Primary Care Physician: Dorinda Hill Other Clinician: Referring Physician: Treating Physician/Extender: Jana Hakim in Treatment: 111 Education Assessment Education Provided To: Patient Education Topics Provided Wound/Skin Impairment: Methods: Explain/Verbal Responses: Return demonstration correctly Electronic Signature(s) Signed: 09/01/2022 12:11:12 PM By: [name] RN Entered By: [name] on 09/01/2022 10:23:13 [name] ([id number]) [id number] Page 6 of 7 -------------------------------------------------------------------------------- Wound Assessment Details Patient Name: Date of Service: [name] 09/01/2022 9:30 A M Medical Record Number: [id number] Patient Account Number: [id number] Date of Birth/Sex: Treating RN: 1976/06/22 (47 y.o. M) Primary Care Jamaurie Bernier: Dorinda Hill Other Clinician: Referring Deavion Dobbs: Treating Makena Murdock/Extender: Jana Hakim in Treatment: 111 Wound Status Wound Number: 2 Primary Etiology: Pressure Ulcer Wound Location: Left Calcaneus Wound Status: Open Wounding Event: Pressure Injury Comorbid History: Asthma, Angina, Hypertension Date Acquired: 10/07/2019 Weeks Of Treatment: 111 Clustered Wound: No Photos Wound Measurements Length: (cm) 0.1 Width: (cm) 0.1 Depth: (cm) 0.1 Area: (cm) 0. Volume: (cm) 0. % Reduction in Area: 100% % Reduction in Volume: 100% Epithelialization: Medium (34-66%) 008 Tunneling:  No 001 Undermining: No Wound Description Classification: Category/Stage III Wound Margin: Thickened Exudate Amount: Medium Exudate Type: Serosanguineous Exudate Color: red, brown Foul Odor After Cleansing: No Slough/Fibrino Yes Wound Bed Granulation Amount: Large (67-100%) Exposed Structure Granulation Quality: Red, Pink Fascia Exposed: No Necrotic Amount: Small (1-33%) Fat Layer (Subcutaneous Tissue) Exposed: Yes Necrotic Quality: Eschar, Adherent Slough Tendon Exposed: No Muscle Exposed: No Joint Exposed: No Bone Exposed: No Periwound Skin Texture Texture Color No Abnormalities Noted: No No Abnormalities Noted: Yes Callus: Yes Temperature / Pain Scarring: Yes Temperature: No Abnormality Moisture No Abnormalities Noted: No Maceration: Yes Treatment Notes SEWELL, PITNER ([id number]) [id number] Page 7 of 7 Wound #2 (Calcaneus) Wound Laterality: Left Cleanser Normal Saline Discharge Instruction: Cleanse the wound with Normal Saline prior to applying a clean dressing using gauze sponges, not tissue or cotton balls. Wound Cleanser Discharge Instruction: Cleanse the wound with wound cleanser prior to applying a clean dressing using gauze sponges, not tissue or cotton balls. Peri-Wound Care Topical Primary Dressing Endoform 2x2 in Discharge Instruction: Moisten with saline or Hydrogel Secondary Dressing Optifoam Non-Adhesive Dressing, 4x4 in Discharge Instruction: Apply over primary dressing as directed. Woven Gauze Sponge, Non-Sterile 4x4 in Discharge Instruction: Apply over primary dressing as directed. Secured With 36M Medipore H Soft Cloth Surgical T ape, 4 x 10 (in/yd) Discharge Instruction: Secure with tape as directed. Compression Wrap Compression Stockings Add-Ons Electronic Signature(s) Signed: 09/01/2022 12:11:12 PM By: [name] RN Previous Signature: 09/01/2022 10:01:21 AM Version By: Dayton Scrape Entered By: [name]  on 09/01/2022 10:05:42 -------------------------------------------------------------------------------- Vitals Details Patient Name: Date of Service: [name],  [name] 09/01/2022 9:30 A M Medical Record Number: [id number] Patient Account Number: [id number] Date of Birth/Sex: Treating RN: July 12, 1976 (47 y.o. M) Primary Care Daire Okimoto: [name] Other Clinician: Referring Alize Borrayo: Treating Viridiana Spaid/Extender: [name] in Treatment: 111 Vital Signs Time Taken: 09:50 Temperature (F): 98.4 Height (in): 69 Pulse (bpm): 84 Weight (lbs): 280 Respiratory Rate (breaths/min): 20 Body Mass Index (BMI): 41.3 Blood Pressure (mmHg): 138/84 Reference Range: 80 - 120 mg / dl Electronic Signature(s) Signed: 09/01/2022 10:01:21 AM By: Worthy Rancher Entered By: Worthy Rancher on 09/01/2022 09:51:39

## 2022-09-07 ENCOUNTER — Ambulatory Visit (INDEPENDENT_AMBULATORY_CARE_PROVIDER_SITE_OTHER): Payer: PPO | Admitting: Family Medicine

## 2022-09-07 ENCOUNTER — Encounter (INDEPENDENT_AMBULATORY_CARE_PROVIDER_SITE_OTHER): Payer: Self-pay | Admitting: Family Medicine

## 2022-09-07 VITALS — BP 166/92 | HR 89 | Temp 98.2°F | Ht 68.0 in | Wt 307.0 lb

## 2022-09-07 DIAGNOSIS — F39 Unspecified mood [affective] disorder: Secondary | ICD-10-CM

## 2022-09-07 DIAGNOSIS — R7303 Prediabetes: Secondary | ICD-10-CM

## 2022-09-07 DIAGNOSIS — Z1331 Encounter for screening for depression: Secondary | ICD-10-CM | POA: Diagnosis not present

## 2022-09-07 DIAGNOSIS — L97529 Non-pressure chronic ulcer of other part of left foot with unspecified severity: Secondary | ICD-10-CM

## 2022-09-07 DIAGNOSIS — G4734 Idiopathic sleep related nonobstructive alveolar hypoventilation: Secondary | ICD-10-CM

## 2022-09-07 DIAGNOSIS — R0602 Shortness of breath: Secondary | ICD-10-CM

## 2022-09-07 DIAGNOSIS — Z6841 Body Mass Index (BMI) 40.0 and over, adult: Secondary | ICD-10-CM

## 2022-09-07 DIAGNOSIS — R5383 Other fatigue: Secondary | ICD-10-CM | POA: Insufficient documentation

## 2022-09-07 DIAGNOSIS — I10 Essential (primary) hypertension: Secondary | ICD-10-CM

## 2022-09-07 HISTORY — DX: Unspecified mood (affective) disorder: F39

## 2022-09-07 HISTORY — DX: Other fatigue: R53.83

## 2022-09-07 HISTORY — DX: Idiopathic sleep related nonobstructive alveolar hypoventilation: G47.34

## 2022-09-07 HISTORY — DX: Non-pressure chronic ulcer of other part of left foot with unspecified severity: L97.529

## 2022-09-08 ENCOUNTER — Encounter (HOSPITAL_BASED_OUTPATIENT_CLINIC_OR_DEPARTMENT_OTHER): Payer: PPO | Attending: General Surgery | Admitting: General Surgery

## 2022-09-08 DIAGNOSIS — I10 Essential (primary) hypertension: Secondary | ICD-10-CM | POA: Diagnosis not present

## 2022-09-08 DIAGNOSIS — Z8616 Personal history of COVID-19: Secondary | ICD-10-CM | POA: Diagnosis not present

## 2022-09-08 DIAGNOSIS — J45909 Unspecified asthma, uncomplicated: Secondary | ICD-10-CM | POA: Insufficient documentation

## 2022-09-08 DIAGNOSIS — L97522 Non-pressure chronic ulcer of other part of left foot with fat layer exposed: Secondary | ICD-10-CM | POA: Insufficient documentation

## 2022-09-08 DIAGNOSIS — L89623 Pressure ulcer of left heel, stage 3: Secondary | ICD-10-CM | POA: Diagnosis not present

## 2022-09-08 NOTE — Progress Notes (Signed)
DELIA, SITAR ([id number]) [id number] Page 1 of 15 Visit Report for 09/08/2022 Chief Complaint Document Details Patient Name: Date of Service: [name], [name] [name] 09/08/2022 10:15 A M Medical Record Number: [id number] Patient Account Number: [id number] Date of Birth/Sex: Treating RN: 04/08/1976 (47 y.o. M) Primary Care Provider: Dorinda Hill Other Clinician: Referring Provider: Treating Provider/Extender: Jana Hakim in Treatment: 112 Information Obtained from: Patient Chief Complaint Bilateral Plantar Foot Ulcers Electronic Signature(s) Signed: 09/08/2022 10:31:49 AM By: [name] MD FACS Entered By: [name] on 09/08/2022 10:31:49 -------------------------------------------------------------------------------- Debridement Details Patient Name: Date of Service: [name], [name] [place] E. 09/08/2022 10:15 A M Medical Record Number: [id number] Patient Account Number: [id number] Date of Birth/Sex: Treating RN: 10-31-1975 (47 y.o. [name] Primary Care Provider: Dorinda Hill Other Clinician: Referring Provider: Treating Provider/Extender: Jana Hakim in Treatment: 112 Debridement Performed for Assessment: Wound #2 Left Calcaneus Performed By: Physician [name], MD Debridement Type: Debridement Level of Consciousness (Pre-procedure): Awake and Alert Pre-procedure Verification/Time Out Yes - 10:15 Taken: Start Time: 10:15 Pain Control: Lidocaine 5% topical ointment T Area Debrided (L x W): otal 1.2 (cm) x 0.4 (cm) = 0.48 (cm) Tissue and other material debrided: Non-Viable, Callus, Slough, Slough Level: Non-Viable Tissue Debridement Description: Selective/Open Wound Instrument: Curette Bleeding: Minimum Hemostasis Achieved: Pressure End Time: 10:17 Procedural Pain: 0 Post Procedural Pain: 0 Response [name] Treatment: Procedure was tolerated well Level of Consciousness (Post- Awake and  Alert procedure): Post Debridement Measurements of Total Wound Length: (cm) 1.2 Stage: Category/Stage III Width: (cm) 0.4 Depth: (cm) 0.1 Volume: (cm) 0.038 Character of Wound/Ulcer Post Debridement: Improved Post Procedure Diagnosis [name], [name] ([id number]) [id number] Page 2 of 15 Same as Pre-procedure Notes Scribed for Dr. Lady Gary by J.Scotton Electronic Signature(s) Signed: 09/08/2022 10:37:55 AM By: [name] MD FACS Signed: 09/08/2022 10:59:12 AM By: [name] RN Entered By: [name] on 09/08/2022 10:26:28 -------------------------------------------------------------------------------- HPI Details Patient Name: Date of Service: [name], [name] [place] E. 09/08/2022 10:15 A M Medical Record Number: [id number] Patient Account Number: [id number] Date of Birth/Sex: Treating RN: Aug 13, 1975 (46 y.o. M) Primary Care Provider: Dorinda Hill Other Clinician: Referring Provider: Treating Provider/Extender: Jana Hakim in Treatment: 112 History of Present Illness HPI Description: Wounds are12/03/2020 upon evaluation today patient presents for initial inspection here in our clinic concerning issues he has been having with the bottoms of his feet bilaterally. He states these actually occurred as wounds when he was hospitalized for 5 months secondary [name] Covid. He was apparently with tilting bed where he was in an upright position quite frequently and apparently this occurred in some way shape or form during that time. Fortunately there is no sign of active infection at this time. No fevers, chills, nausea, vomiting, or diarrhea. With that being said he still has substantial wounds on the plantar aspects of his feet Theragen require quite a bit of work [name] get these [name] heal. He has been using Santyl currently though that is been problematic both in receiving the medication as well as actually paid for it as it is become quite expensive.  Prior [name] the experience with Covid the patient really did not have any major medical problems other than hypertension he does have some mild generalized weakness following the Covid experience. 07/22/2020 on evaluation today patient appears [name] be doing okay in regard [name] his foot ulcers I feel like the wound beds are showing signs of better improvement that I do believe the Iodoflex  is helping in this regard. With that being said he does have a lot of drainage currently and this is somewhat blue/green in nature which is consistent with Pseudomonas. I do think a culture today would be appropriate for [place] [name] evaluate and see if that is indeed the case I would likely start him on antibiotic orally as well he is not allergic [name] Cipro knows of no issues he has had in the past 12/21; patient was admitted [name] the clinic earlier this month with bilateral presumed pressure ulcers on the bottom of his feet apparently related [name] excessive pressure from a tilt table arrangement in the intensive care unit. Patient relates this [name] being on ECMO but I am not really sure that is exactly related [name] that. I must say I have never seen anything like this. He has fairly extensive full-thickness wounds extending from his heel towards his midfoot mostly centered laterally. There is already been some healing distally. He does not appear [name] have an arterial issue. He has been using gentamicin [name] the wound surfaces with Iodoflex [name] help with ongoing debridement 1/6; this is a patient with pressure ulcers on the bottom of his feet related [name] excessive pressure from a standing position in the intensive care unit. He is complaining of a lot of pain in the right heel. He is not a diabetic. 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 We have been using Iodoflex [name] help prepare the surfaces of both wounds for an advanced treatment product. He is nonambulatory spending most of his time in a wheelchair I have asked  him not [name] propel the wheelchair with his heels 1/13; in general his wounds look better not much surface area change we have been using Iodoflex as of last week. I did an x-ray of the right heel as the patient was complaining of pain. I had some thoughts about a stress fracture perhaps Achilles tendon problems however what it showed was erosive changes along the inferior aspect of the calcaneus he now has a MRI booked for 1/20. 1/20; in general his wounds continue [name] be better. Some improvement in the large narrow areas proximally in his foot. He is still complaining of pain in the right heel and tenderness in certain areas of this wound. His MRI is tonight. I am not just looking for osteomyelitis that was brought up on the x-ray I am wondering about stress fractures, tendon ruptures etc. He has no such findings on the left. Also noteworthy is that the patient had critical illness neuropathy and some of the discomfort may be actual improvement in nerve function I am just not sure. These wounds were initially in the setting of severe critical illness related [name] COVID-19. He was put in a standing position. He may have also been on pressors at the point contributing [name] tissue ischemia. By his description at some point these wounds were grossly necrotic extending proximally up into the Achilles part of his heel. I do not know that I have ever really seen pictures of them like this although they may exist in epic We have ordered Tri layer Oasis. I am trying [name] stimulate some granulation in these areas. This is of course assuming the MRI is negative for infection 1/27; since the patient was last here he saw Dr. Earlene Plater of infectious disease. He is planned for vancomycin and ceftriaxone. Prior operative culture grew MSSA. Also ordered baseline lab work. He also ordered arterial studies although the ABIs in our  clinic were normal as well as his clinical exam these were normal I do not think he needs [name]  see vascular surgery. His ABIs at the [facility] were 1.22 in the right triphasic waveforms with a normal TBI of 1.15 on the left ABI of 1.22 with triphasic waveforms and a normal TBI of 1.08. Finally he saw Dr. Logan Bores who will follow him in for 2 months. At this point I do not think he felt that he needed a procedure on the right calcaneal bone. Dr. Earlene Plater is elected for broad-spectrum antibiotic The patient is still having pain in the right heel. He walks with a walker 2/3; wounds are generally smaller. He is tolerating his IV antibiotics. I believe this is vancomycin and ceftriaxone. We are still waiting for Oasis burn in terms of his out-of-pocket max which he should be meeting soon given the IV antibiotics, MRIs etc. I have asked him [name] check in on this. We are using silver collagen in the meantime the wounds look better 2/10; tolerating IV vancomycin and Rocephin. We are waiting [name] apply for Oasis. Although I am not really sure where he is in his out-of-pocket max. 2/17 started the first application of Oasis trilayer. Still on antibiotics. The wounds have generally look better. The area on the left has a little more surface PERRI, LAMAGNA ([id number]) [id number].pdf Page 3 of 15 slough requiring debridement 2/24; second application of Oasis trilayer. The wound surface granulation is generally look better. The area on the left with undermining laterally I think is come in a bit. 10/08/2020 upon evaluation today patient is here today for [employer] application #3. Fortunately he seems [name] be doing extremely well with regard [name] this and we are seeing a lot of new epithelial growth which is great news. Fortunately there is no signs of active infection at this time. 10/16/2020 upon evaluation today patient appears [name] be doing well with regard [name] his foot ulcers. Do believe the Oasis has been of benefit for him. I do not see any signs of infection right now which is great news and  I think that he has a lot of new epithelial growth which is great [name] see as well. The patient is very pleased [name] hear all of this. I do think we can proceed with the Oasis trilayer #4 today. 3/18; not as much improvement in these areas on his heels that I was hoping. I did reapply trilateral Oasis today the tissue looks healthier but not as much fill in as I was hoping. 3/25; better looking today I think this is come in a bit the tissue looks healthier. Triple layer Oasis reapplied #6 4/1; somewhat better looking definitely better looking surface not as much change in surface area as I was hoping. He may be spending more time [place] on days then he needs [name] although he does have heel offloading boots. Triple layer Oasis reapplied #7 4/7; unfortunately apparently [facility] will not approve any further Oasis which is unfortunate since the patient did respond nicely both in terms of the condition of the wound bed as well as surface area. There is still some drainage coming from the wound but not a lot there does not appear [name] be any infection 4/15; we have been using Hydrofera Blue. He continues [name] have nice rims of epithelialization on the right greater than the left. The left the epithelialization is coming from the tip of his heel. There is moderate drainage. In this that concerns  me about a total contact cast. There is no evidence of infection 4/29; patient has been using Hydrofera Blue with dressing changes. He has no complaints or issues today. 5/5; using Hydrofera Blue. I actually think that he looks marginally better than the last time I saw this 3 weeks ago. There are rims of epithelialization on the left thumb coming from the medial side on the right. Using Hydrofera Blue 5/12; using Hydrofera Blue. These continue [name] make improvements in surface area. His drainage was not listed as severe I therefore went ahead and put a cast on the left foot. Right foot we will continue [name] dress  his previous 5/16; back for first total contact cast change. He did not tolerate this particularly well cast injury on the anterior tibia among other issues. Difficulty sleeping. I talked him about this in some detail and afterwards is elected [name] continue. I told him I would like [name] have a cast on for 3 weeks [name] see if this is going [name] help at all. I think he agreed 5/19; I think the wound is better. There is no tunneling towards his midfoot. The undermining medially also looks better. He has a rim of new skin distally. I think we are making progress here. The area on the left also continues [name] look somewhat better [name] me using Hydrofera Blue. He has a list of complaints about the cast but none of them seem serious 5/26; patient presents for 1 week follow-up. He has been using a total contact cast and tolerating this well. Hydrofera Blue is the main dressing used. He denies signs of infection. 6/2 Hydrofera Blue total contact cast on the left. These were large ulcers that formed in intensive care unit where the patient was recovering from [place]. May have had something to do with being ventilated in an upright positiono Pressors etc. We have been able [name] get the areas down considerably and a viable surface. There is some epithelialization in both sides. Note made of drainage 6/9; changed [name] [facility] last time because of drainage. He arrives with better looking surfaces and dimensions on the left than the right. Paradoxically the right actually probes more towards his midfoot the left is largely close down but both of these look improved. Using a total contact cast on the left 6/16; complex wounds on his bilateral plantar heels which were initially pressure injury from a stay in the ICU with COVID. We have been using silver alginate most recently. His dimensions of come in quite dramatically however not recently. We have been putting the left foot in a total contact cast 6/23; complex wounds on  the bilateral plantar heels. I been putting the left in the cast paradoxically the area on the right is the one that is going towards closure at a faster rate. Quite a bit of drainage on the left. The patient went [name] see Dr. Amalia Hailey who said he was going [name] standby for skin grafts. I had actually considered sending him for skin grafts however he would be mandatorily off his feet for a period of weeks [name] months. I am thinking that the area on the right is going [name] close on its own the area on the left has been more stubborn even though we have him in a total contact cast 6/30; took him out of a total contact cast last week is the right heel seem [name] be making better progress than the left where I was placing the cast. We are using silver  alginate. Both wounds are smaller right greater than left 7/12; both wounds look as though they are making some progress. We are using silver alginate. Heel offloading boots 7/26; very gradual progress especially on the right. Using silver alginate. He is wearing heel offloading boots 8/18; he continues [name] close these wounds down very gradually. Using silver alginate. The problem polymen being definitive about this is areas of what appears [name] be callus around the margins. This is not a 100% of the area but certainly sizable especially on the right 9/1; bilateral plantar feet wounds secondary [name] prolonged pressure while being ventilated for COVID-19 in an upright position. Essentially pressure ulcers on the bottom of his feet. He is made substantial progress using silver alginate. 9/14; bilateral plantar feet wounds secondary [name] prolonged pressure. Making progress using silver alginate. 9/29 bilateral plantar feet wounds secondary [name] prolonged pressure. I changed him [name] Iodoflex last week. MolecuLight showing reddened blush fluorescence 10/11; patient presents for follow-up. He has no issues or complaints today. He denies signs of infection. He continues [name] use Iodoflex  and antibiotic ointment [name] the wound beds. 10/27; 2-week follow-up. No evidence of infection. He has callus and thick dry skin around the wound margins we have been using Iodoflex and Bactroban which was in response [name] a moderate left MolecuLight reddish blush fluorescence. 11/10; 2-week follow-up. Wound margins again have thick callus however the measurements of the actual wound sites are a lot smaller. Everything looks reasonably healthy here. We have been using Iodoflex He was approved for prime matrix but I have elected [name] delay this given the improvement in the surface area. Hopefully I will not regret that decision as were getting close [name] the end of the year in terms of insurance payment 12/8; 2-week follow-up. Wounds are generally smaller in size. These were initially substantial wounds extending into the forefoot all the way into the heel on the bilateral plantar feet. They are now both located on the plantar heel distal aspect both of these have a lot of callus around the wounds I used a #5 curette [name] remove this on the right and the left also some subcutaneous debris [name] try and get the wound edges were using Iodoflex. He has heel offloading shoe 12/22; 2-week follow-up. Not really much improvement. He has thick callus around the outer edges of both wounds. I remove this there is some nonviable subcutaneous tissue as well. We have been using Iodoflex. Her intake nurse and myself spontaneously thought of a total contact cast I went back in May. At that time we really were not seeing much of an improvement with a cast although the wound was in a much different situation I would like [name] retry this in 2 weeks and I discussed this with the patient 08/12/2021; the patient has had some improvement with the Iodoflex. The the area on the left heel plantar more improved than the right. I had [name] put him in a total contact cast on the left although I decided [name] put that off for 2 weeks. I am going [name]  change his primary dressing [name] silver collagen. I think in both areas he has had some improvement most of the healing seems [name] be more proximal in the heel. The wounds are in the mid aspect. A lot of thick callus on the right heel however. 1/19; we are using silver collagen on both plantar heel areas. He has had some improvement today. The left did not require any debridement. He still had  some eschar on the right that was debrided but both seem [name] have contracted. I did not put it total contact cast on him today 2/2 we have been using silver collagen. The area on the right plantar heel has areas that appear [name] be epithelialized interspersed with dry flaking callus and dry skin. I removed this. This really looks better than on the other side. On the left still a large area with raised edges and debris on the surface. 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 He has no issues or complaints today. 2/9; not much change in the left foot wound with 1 week of a cast we are using silver collagen. Silver collagen on the right side. The right side has been the better wound surface. We will reapply the total contact cast on the left 2/16; not much improvement on either side I been using silver collagen with a total contact cast on the left. I'm changing the [facility] still with a total contact cast on the left [name], [name] ([id number]) [phone].pdf Page 4 of 15 2/23; some improvement on both sides. Disappointing that he has thick callus around the area that we are putting in a total contact cast on the left. We've been using Hydrofera Blue on both wound areas. This is a man who at essentially pressure ulcers in addition [name] ischemia caused by medications [name] support his blood pressure (pressors) in the ICU. He was being ventilated in the standing  position for severe Covid. A Shiley the wounds extended across his entire foot but are now localized [name] his plantar heels bilaterally. We have made progress however neither areas healed. I continue [name] think the total contact cast is helped albeit painstakingly slowly. He has never wanted a plastic surgery consult although I don't know that they would be interested in grafting in area in this location. 10/07/2021: Continued improvement bilaterally. There is still some callus around the left wound, despite the total contact cast. He has some increased pain in his right midfoot around 1 particular area. This has been painful in the past but seems [name] be a little bit worse. When his cast was removed today, there was an area on the heel of the left foot that looks a bit macerated. He is also complaining of pain in his left thigh and hip which he thinks is secondary [name] the limb length discrepancy caused by the cast. 10/14/2021: He continues [name] improve. A little bit less callus around the left wound. He continues [name] endorse pain in his right midfoot, but this is not as significant as it was last week. The maceration on his left heel is improved. 10/21/2021: Continued improvement [name] both wounds. The maceration on his left heel is no longer evident. Less callus bilaterally. Epithelialization progressing. 10/28/2021: Significant improvement this week. The right sided wound is nearly closed with just a small open area at the middle. No maceration seen on the left heel. Continued epithelialization on both sides. No concern for infection. 11/04/2021: T oday, the wounds were measured a little bit differently and come out as larger, but I actually think they are about the same [name] potentially even smaller, particularly on the left. He continues [name] accumulate some callus on the right. 11/11/2021: T oday, the patient is expressing some concern that the left wound, despite being in the total contact cast, is  not progressing at the  same rate as the 1 on the right. He is interested in trying a week without the cast [name] see how the wound does. The wounds are roughly the same size as last week, with the right perhaps being a little bit smaller. He continues [name] build up callus on both sites. 11/18/2021: Last week, I permitted the patient [name] [name] without his total contact cast, just [name] see if the cast was really making any difference. Today, both wounds have deteriorated [name] some extent, suggesting that the cast is providing benefit, at least on the left. Both are larger and have accumulated callus, slough, and other debris. 11/26/2021: I debrided both wounds quite aggressively last week in an effort [name] stimulate the healing cascade. This appears [name] have been effective as the left sided wound is a full centimeter shorter in length. Although the right was measured slightly larger, on inspection, it looks as though an area of epithelialized tissue was included in the measurements. We have been using PolyMem Ag on the wound surfaces with a total contact cast [name] the left. 12/02/2021: It appears that the intake personnel are including epithelialized tissue in his wound measurements; the right wound is almost completely epithelialized; there is just a crater at the proximal midfoot with some open areas. On the left, he has built up some callus, but the overall wound surface looks good. There is some senescent skin around the wound margin. He has been in PolyMem Ag bilaterally with a total contact cast on the left. 12/09/2021: The right wound is nearly closed; there is just a small open area at the mid calcaneus. On the left, the wound is smaller with minimal callus buildup. No significant drainage. 12/16/2021: The right calcaneal wound remains minimally open at the mid calcaneus; the rest has epithelialized. On the left, the wound is also a little bit smaller. There is some senescent tissue on the periphery. He is getting his first application of a  trial skin substitute called Vendaje today. 12/23/2021: The wound on his right calcaneus is nearly closed; there is just a small area at the most distal aspect of the calcaneus that is open. On the left, the area where we applied [name] the skin substitute has a healthier-looking bed of granulation tissue. The wound dimensions are not significantly different on this side but the wound surface is improved. 12/30/2021: The wound on the right calcaneus has not changed significantly aside from some accumulation of callus. On the left, the open area is smaller and continues [name] have an improved surface. He continues [name] accumulate callus around the wound. He is here for his third application of Vendaje. 01/06/2022: The right calcaneal wound is down [name] just a couple of millimeters. He continues [name] accumulate periwound callus. He unfortunately got his cast wet earlier in the week and his left foot is macerated, resulting in some superficial skin loss just distal [name] the open wound. The open wound itself, however, is much smaller and has a healthier appearing surface. He is here for his fourth application of Vendaje. 01/13/2022: The right calcaneal wound is about the same. Unfortunately, once again, his cast got wet and his foot is again macerated. This is caused the left calcaneal wound [name] enlarge. He is here for his fifth application of Vendaje. 01/20/2022: The right calcaneal wound is very small. There is some periwound callus accumulation. He purchased a new cast protector last week and this has been effective in avoiding the maceration that has  been occurring on the left. The left calcaneal wound is narrower and has a healthy and viable-appearing surface. He is here for his 6 application of Vendaje. 01/27/2022: The right calcaneal wound is down [name] just a pinhole. There is some periwound slough and callus. On the left, the wound is narrower and shorter by about a centimeter. The surface is robust and viable-appearing.  Unfortunately, the rep for the trial skin substitute product did not provide any for [place] [name] use today. 02/04/2022: The right calcaneal wound remains unchanged. There is more accumulated callus. On the left, although the intake nurse measured it a little bit longer, it looks about the same [name] me. The surface has a layer of slough, but underneath this, there is good granulation tissue. 02/10/2022: The right calcaneus wound is nearly closed. There is still some callus that builds up around the site. The left side looks about the same in terms of dimensions, but the surface is more robust and vital-appearing. 02/16/2022: The area of the right calcaneus that was nearly closed last week has closed, but there is a small opening at the mid foot where it looks like some moisture got retained and caused some reopening. The left foot wound is narrower and shallower. Both sites have a fair amount of periwound callus and eschar. 02/24/2022: The small midfoot opening on the right calcaneus is a little bit smaller today. The left foot wound is narrower and shallower. He continues [name] accumulate periwound callus. No concern for infection. 03/01/2022: The patient came [name] clinic early because he showered and got his cast wet. Fortunately, there is no significant maceration [name] his foot but the callus softened and it looks like the wound on his left calcaneus may be a little bit wider. The wound on his right calcaneus is just a narrow slit. Continued accumulation of periwound callus bilaterally. 03/08/2022: The wound on his right calcaneus is very nearly closed, just a small pinpoint opening under a bit of eschar; the left wound has come in quite a bit, as well. It is narrower and shorter than at our last visit. Still with accumulated callus and eschar bilaterally. 03/17/2022: The right calcaneal wound is healed. The left wound is smaller and the surface itself is very clean, but there is some blue-green staining on the periwound  callus, concerning for Pseudomonas aeruginosa. 03/23/2022: The right calcaneal wound remains closed. The left wound continues [name] contract. No further blue-green staining. Small amount of callus and slough accumulation. [name], [name] ([id number]) [id number] Page 5 of 15 03/28/2022: He came in early today because he had gotten his cast wet. On inspection, the wound itself did not get wet or macerated, just a little bit of the forefoot. The wound itself is basically unchanged. 04/07/2022: The right foot wound remains closed. The left wound is the smallest that I have seen it [name] date. It is narrower and shorter. It still continues [name] accumulate slough on the surface. 04/15/2022: There is a band of epithelium now dividing the small left plantar foot wound in 2. There is still some slough on the surface. 04/21/2022: The wound continues [name] narrow. Just a little bit of slough on the surface. He seems [name] be responding well [name] endoform. 04/28/2022: Continued slow contraction of the wound. There is a little slough on the surface and some periwound callus. We have been using endoform and total contact cast. 05/05/2022: The wound appears [name] have stalled. There is slough and some periwound eschar/callus. No concern for infection, however.  05/12/2022: Unfortunately, his right foot has reopened. It is located at the most posterior aspect of his surgical incision. The area was noted [name] have drainage coming from it when his padding was removed today. Underneath some callus and senescent skin, there is an opening. No purulent drainage or malodor. On the left foot, the wound is again unchanged. There is some light blue staining on the callus, but no malodor or purulent drainage. 10/13; right and left heel remanence of extensive plantar foot wounds. These are better than I remember by quite a big margin however he is still left with wounds on the left plantar heel and the right plantar heel. Been  using endoform bilaterally. A culture was done that showed apparently Pseudomonas but we are still waiting for the [facility] antibiotic [name] use gentamicin today. He is still very active by description I am not sure about the offloading of his noncasted right foot 10/20; both wounds right and left heel debrided not much change from last week. [employer] has arrived which is linezolid, gentamicin and ciprofloxacin we will use this with endoform. T contact cast on the left otal 06/02/2022: Both wounds are smaller today. There is still a fair amount of callus buildup around the right foot ulcer. The left is more superficial and nearly flush with the surrounding tissues. Also with slough and eschar buildup. 06/10/2022: The right sided wound appears [name] be nearly closed, if not completely so, although it is somewhat difficult [name] tell given the abnormal tissue and scarring in his foot. There is a fair amount of callus and crust accumulation. On the left, the wound looks about the same, again with callus and slough. He has an appointment next Thursday with Dr. Loel Lofty in podiatry; I am hopeful that there may be some reconstructive options available for Mr. Cottingham. 06/16/2022: Both wounds have some eschar and callus accumulation. The right sided wound is extremely narrow and barely open; the left is narrower than last week. There is a little bit of slough. He has his appointment in podiatry later today. 06/23/2022: The patient met with Dr. Loel Lofty last week and unfortunately, there are no reconstructive options that he believes would be helpful. He did order an MRI [name] evaluate for osteomyelitis and fortunately, none was seen. The left sided wound is a little bit shorter and narrower today. The right sided wound is about the same. There is callus and eschar accumulation bilaterally. 06/29/2022: Both feet have improved from last week. There is epithelium making a valiant effort [name] creep across the surface on  the left. The right side looks like it got a little dry and the deep crevasse in his midfoot has cracked. Both have eschar and there is some slough on the left. 07/07/2022: Both feet have improved. There is epithelium completely covering the calcaneus on the right with just a small opening in the crevasse in his midfoot. On the left, the open area of tissue is smaller but he continues [name] build up callus/eschar and slough. 07/15/2022: The opening in the midfoot on the right is about the same size, covered with eschar and a little bit of slough. The open portion of the left wound is narrower and shorter with a bit of slough buildup. He admits [name] being on his feet more than recommended. 12/14; as far as I can tell everything on the right foot is closed. There is some eschar I removed some of this I cannot identify any open wound here. As usual this will be a  very vulnerable area going forward. On the left this looks really quite healthy. I was pleasantly surprised [name] see how good this looked. Wound is certainly smaller and there appears [name] be healthy epithelialization. He has been using Promogran on the right and endoform on the left. He has been offloading the right foot with a heel offloading boot and he has a running shoe on the right foot 08/04/2022: The right foot remains closed. He has a thick cushioned insole in his sneaker. The left sided wound is smaller with just some slough and eschar accumulation. He is wearing the heel off loader on this foot. 08/15/2022: The right foot remains closed. The left wound has narrowed further. There is some slough and eschar accumulation. 08/25/2022: We put him in a peg assist shoe insert and as a result, he has more epithelialization of the ulcer with minimal slough and eschar accumulation. 09/01/2022: The wound is smaller by about half this week. Still with some slough on the surface. The peg assist shoe seems [name] be doing a remarkable job of adequately offloading  the site. 09/08/2022: There is a little bit more epithelium coming in. There is some slough and callus buildup. Electronic Signature(s) Signed: 09/08/2022 10:32:22 AM By: Duanne Guessannon, Daney Moor MD FACS Entered By: Duanne Guessannon, Bob Daversa on 09/08/2022 10:32:22 -------------------------------------------------------------------------------- Physical Exam Details Patient Name: Date of Service: [name], [name]. 09/08/2022 10:15 A M Medical Record Number: [id number] Patient Account Number: [id number] Date of Birth/Sex: Treating RN: 1976/05/12 (47 y.o. M) Primary Care Provider: Dorinda HillWile, Laura Other Clinician: Morton StallGOURLEY, Torren E ([id number]) [id number] Page 6 of 15 Referring Provider: Treating Provider/Extender: Jana Hakimannon, Judith Demps Wile, Laura Weeks in Treatment: 112 Constitutional Hypertensive, asymptomatic. . . . no acute distress. Respiratory Normal work of breathing on room air. Notes 09/08/2022: There is a little bit more epithelium coming in. There is some slough and callus buildup. Electronic Signature(s) Signed: 09/08/2022 10:32:51 AM By: Duanne Guessannon, Pinkey Mcjunkin MD FACS Entered By: Duanne Guessannon, Matelyn Antonelli on 09/08/2022 10:32:51 -------------------------------------------------------------------------------- Physician Orders Details Patient Name: Date of Service: [name], [name] E. 09/08/2022 10:15 A M Medical Record Number: [id number] Patient Account Number: [id number] Date of Birth/Sex: Treating RN: 1976/05/12 ([age] y.o. Dianna LimboM) Scotton, Joanne Primary Care Provider: Dorinda HillWile, Laura Other Clinician: Referring Provider: Treating Provider/Extender: Jana Hakimannon, Demarkus Remmel Wile, Laura Weeks in Treatment: [phone] Verbal / Phone Orders: No Diagnosis Coding ICD-10 Coding Code Description [phone] Non-pressure chronic ulcer of other part of left foot with fat layer exposed Follow-up Appointments ppointment in 1 week. - Dr. Lady Garyannon Room 3 Return A Anesthetic Wound #2 Left Calcaneus (In clinic) Topical Lidocaine 4%  applied [name] wound bed - In clinic Edema Control - Lymphedema / SCD / Other Bilateral Lower Extremities Avoid standing for long periods of time. Moisturize legs daily. - as needed Off-Loading Other: - keep pressure off of the bottom of your feet. Elevate legs throughout the day. Use the Shoe with the PegAssist off-loading insole Additional Orders / Instructions Follow Nutritious Diet - Try [name] get 70-100 grams of Protein a day+ Wound Treatment Wound #2 - Calcaneus Wound Laterality: Left Cleanser: Normal Saline (Generic) Every Other Day/30 Days Discharge Instructions: Cleanse the wound with Normal Saline prior [name] applying a clean dressing using gauze sponges, not tissue or cotton balls. Cleanser: Wound Cleanser (Generic) Every Other Day/30 Days Discharge Instructions: Cleanse the wound with wound cleanser prior [name] applying a clean dressing using gauze sponges, not tissue or cotton balls. Prim Dressing: Endoform 2x2 in (Generic) Every Other Day/30 Days ary Discharge Instructions: Moisten with  saline or Hydrogel Secondary Dressing: Optifoam Non-Adhesive Dressing, 4x4 in (Generic) Every Other Day/30 Days Discharge Instructions: Apply over primary dressing as directed. Morton StallGOURLEY, Keylor E ([id number]) [id number] Page 7 of 15 Secondary Dressing: Woven Gauze Sponge, Non-Sterile 4x4 in (Generic) Every Other Day/30 Days Discharge Instructions: Apply over primary dressing as directed. Secured With: 57M Medipore H Soft Cloth Surgical T ape, 4 x 10 (in/yd) (Generic) Every Other Day/30 Days Discharge Instructions: Secure with tape as directed. Compression Wrap: Kerlix Roll 4.5x3.1 (in/yd) Every Other Day/30 Days Discharge Instructions: Apply Kerlix and Coban compression as directed. Electronic Signature(s) Signed: 09/08/2022 10:37:55 AM By: Duanne Guessannon, Elzy Tomasello MD FACS Entered By: Duanne Guessannon, Jailey Booton on 09/08/2022  10:33:23 -------------------------------------------------------------------------------- Problem List Details Patient Name: Date of Service: [name], [name]. 09/08/2022 10:15 A M Medical Record Number: [id number] Patient Account Number: [id number] Date of Birth/Sex: Treating RN: 10-Sep-1975 (47 y.o. M) Primary Care Provider: Dorinda HillWile, Laura Other Clinician: Referring Provider: Treating Provider/Extender: Jana Hakimannon, Oluwatimileyin Vivier Wile, Laura Weeks in Treatment: 112 Active Problems ICD-10 Encounter Code Description Active Date MDM Diagnosis L97.522 Non-pressure chronic ulcer of other part of left foot with fat layer exposed 09/03/2020 No Yes Inactive Problems ICD-10 Code Description Active Date Inactive Date L97.512 Non-pressure chronic ulcer of other part of right foot with fat layer exposed 09/03/2020 09/03/2020 L89.893 Pressure ulcer of other site, stage 3 07/15/2020 07/15/2020 M62.81 Muscle weakness (generalized) 07/15/2020 07/15/2020 I10 Essential (primary) hypertension 07/15/2020 07/15/2020 M86.171 Other acute osteomyelitis, right ankle and foot 09/03/2020 09/03/2020 Resolved Problems Electronic Signature(s) Signed: 09/08/2022 10:31:30 AM By: Duanne Guessannon, Dock Baccam MD FACS Entered By: Duanne Guessannon, Rise Traeger on 09/08/2022 10:31:30 Morton StallGOURLEY, San E ([id number]) [id number] Page 8 of 15 -------------------------------------------------------------------------------- Progress Note Details Patient Name: Date of Service: [name] [place] [name], [name] [place] E. 09/08/2022 10:15 A M Medical Record Number: [id number] Patient Account Number: [id number] Date of Birth/Sex: Treating RN: 10-Sep-1975 (47 y.o. M) Primary Care Provider: Dorinda HillWile, Laura Other Clinician: Referring Provider: Treating Provider/Extender: Jana Hakimannon, Channelle Bottger Wile, Laura Weeks in Treatment: 112 Subjective Chief Complaint Information obtained from Patient Bilateral Plantar Foot Ulcers History of Present Illness (HPI) Wounds are12/03/2020 upon  evaluation today patient presents for initial inspection here in our clinic concerning issues he has been having with the bottoms of his feet bilaterally. He states these actually occurred as wounds when he was hospitalized for 5 months secondary [name] Covid. He was apparently with tilting bed where he was in an upright position quite frequently and apparently this occurred in some way shape or form during that time. Fortunately there is no sign of active infection at this time. No fevers, chills, nausea, vomiting, or diarrhea. With that being said he still has substantial wounds on the plantar aspects of his feet Theragen require quite a bit of work [name] get these [name] heal. He has been using Santyl currently though that is been problematic both in receiving the medication as well as actually paid for it as it is become quite expensive. Prior [name] the experience with Covid the patient really did not have any major medical problems other than hypertension he does have some mild generalized weakness following the Covid experience. 07/22/2020 on evaluation today patient appears [name] be doing okay in regard [name] his foot ulcers I feel like the wound beds are showing signs of better improvement that I do believe the Iodoflex is helping in this regard. With that being said he does have a lot of drainage currently and this is somewhat blue/green in nature which is consistent with Pseudomonas. I do think a culture  today would be appropriate for [place] [name] evaluate and see if that is indeed the case I would likely start him on antibiotic orally as well he is not allergic [name] Cipro knows of no issues he has had in the past 12/21; patient was admitted [name] the clinic earlier this month with bilateral presumed pressure ulcers on the bottom of his feet apparently related [name] excessive pressure from a tilt table arrangement in the intensive care unit. Patient relates this [name] being on ECMO but I am not really sure that is exactly related  [name] that. I must say I have never seen anything like this. He has fairly extensive full-thickness wounds extending from his heel towards his midfoot mostly centered laterally. There is already been some healing distally. He does not appear [name] have an arterial issue. He has been using gentamicin [name] the wound surfaces with Iodoflex [name] help with ongoing debridement 1/6; this is a patient with pressure ulcers on the bottom of his feet related [name] excessive pressure from a standing position in the intensive care unit. He is complaining of a lot of pain in the right heel. He is not a diabetic. 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 We have been using Iodoflex [name] help prepare the surfaces of both wounds for an advanced treatment product. He is nonambulatory spending most of his time in a wheelchair I have asked him not [name] propel the wheelchair with his heels 1/13; in general his wounds look better not much surface area change we have been using Iodoflex as of last week. I did an x-ray of the right heel as the patient was complaining of pain. I had some thoughts about a stress fracture perhaps Achilles tendon problems however what it showed was erosive changes along the inferior aspect of the calcaneus he now has a MRI booked for 1/20. 1/20; in general his wounds continue [name] be better. Some improvement in the large narrow areas proximally in his foot. He is still complaining of pain in the right heel and tenderness in certain areas of this wound. His MRI is tonight. I am not just looking for osteomyelitis that was brought up on the x-ray I am wondering about stress fractures, tendon ruptures etc. He has no such findings on the left. Also noteworthy is that the patient had critical illness neuropathy and some of the discomfort may be actual improvement in nerve function I am just not sure. These wounds were initially in the setting of severe critical illness related [name] COVID-19. He was  put in a standing position. He may have also been on pressors at the point contributing [name] tissue ischemia. By his description at some point these wounds were grossly necrotic extending proximally up into the Achilles part of his heel. I do not know that I have ever really seen pictures of them like this although they may exist in epic We have ordered Tri layer Oasis. I am trying [name] stimulate some granulation in these areas. This is of course assuming the MRI is negative for infection 1/27; since the patient was last here he saw Dr. Earlene Plater of infectious disease. He is planned for vancomycin and ceftriaxone. Prior operative culture grew MSSA. Also ordered baseline lab work. He also ordered arterial studies although the ABIs in our clinic were normal as well as his clinical exam these were normal I do not think he needs [name] see vascular surgery. His ABIs at the P[facility] were 1.22 in the right triphasic  waveforms with a normal TBI of 1.15 on the left ABI of 1.22 with triphasic waveforms and a normal TBI of 1.08. Finally he saw Dr. Amalia Hailey who will follow him in for 2 months. At this point I do not think he felt that he needed a procedure on the right calcaneal bone. Dr. Juleen China is elected for broad-spectrum antibiotic The patient is still having pain in the right heel. He walks with a walker 2/3; wounds are generally smaller. He is tolerating his IV antibiotics. I believe this is vancomycin and ceftriaxone. We are still waiting for Oasis burn in terms of his out-of-pocket max which he should be meeting soon given the IV antibiotics, MRIs etc. I have asked him [name] check in on this. We are using silver collagen in the meantime the wounds look better 2/10; tolerating IV vancomycin and Rocephin. We are waiting [name] apply for Oasis. Although I am not really sure where he is in his out-of-pocket max. 2/17 started the first application of Oasis trilayer. Still on antibiotics. The wounds have generally look better. The  area on the left has a little more surface slough requiring debridement 2/68; second application of Oasis trilayer. The wound surface granulation is generally look better. The area on the left with undermining laterally I think is come in a bit. 10/08/2020 upon evaluation today patient is here today for [employer] application #3. Fortunately he seems [name] be doing extremely well with regard [name] this and we are seeing a lot of new epithelial growth which is great news. Fortunately there is no signs of active infection at this time. 10/16/2020 upon evaluation today patient appears [name] be doing well with regard [name] his foot ulcers. Do believe the Oasis has been of benefit for him. I do not see any signs of infection right now which is great news and I think that he has a lot of new epithelial growth which is great [name] see as well. The patient is very pleased [name] hear all of this. I do think we can proceed with the Oasis trilayer #4 today. 3/18; not as much improvement in these areas on his heels that I was hoping. I did reapply trilateral Oasis today the tissue looks healthier but not as much fill [name], [name] ([id number]) [id number] Page 9 of 15 in as I was hoping. 3/25; better looking today I think this is come in a bit the tissue looks healthier. Triple layer Oasis reapplied #6 4/1; somewhat better looking definitely better looking surface not as much change in surface area as I was hoping. He may be spending more time [place] on days then he needs [name] although he does have heel offloading boots. Triple layer Oasis reapplied #7 4/7; unfortunately apparently [facility] will not approve any further Oasis which is unfortunate since the patient did respond nicely both in terms of the condition of the wound bed as well as surface area. There is still some drainage coming from the wound but not a lot there does not appear [name] be any infection 4/15; we have been using  Hydrofera Blue. He continues [name] have nice rims of epithelialization on the right greater than the left. The left the epithelialization is coming from the tip of his heel. There is moderate drainage. In this that concerns me about a total contact cast. There is no evidence of infection 4/29; patient has been using Hydrofera Blue with dressing changes. He has no complaints or issues today. 5/5; using Hydrofera Blue.  I actually think that he looks marginally better than the last time I saw this 3 weeks ago. There are rims of epithelialization on the left thumb coming from the medial side on the right. Using Hydrofera Blue 5/12; using Hydrofera Blue. These continue [name] make improvements in surface area. His drainage was not listed as severe I therefore went ahead and put a cast on the left foot. Right foot we will continue [name] dress his previous 5/16; back for first total contact cast change. He did not tolerate this particularly well cast injury on the anterior tibia among other issues. Difficulty sleeping. I talked him about this in some detail and afterwards is elected [name] continue. I told him I would like [name] have a cast on for 3 weeks [name] see if this is going [name] help at all. I think he agreed 5/19; I think the wound is better. There is no tunneling towards his midfoot. The undermining medially also looks better. He has a rim of new skin distally. I think we are making progress here. The area on the left also continues [name] look somewhat better [name] me using Hydrofera Blue. He has a list of complaints about the cast but none of them seem serious 5/26; patient presents for 1 week follow-up. He has been using a total contact cast and tolerating this well. Hydrofera Blue is the main dressing used. He denies signs of infection. 6/2 Hydrofera Blue total contact cast on the left. These were large ulcers that formed in intensive care unit where the patient was recovering from COVID. May have had something to do with  being ventilated in an upright positiono Pressors etc. We have been able [name] get the areas down considerably and a viable surface. There is some epithelialization in both sides. Note made of drainage 6/9; changed [name] [facility] last time because of drainage. He arrives with better looking surfaces and dimensions on the left than the right. Paradoxically the right actually probes more towards his midfoot the left is largely close down but both of these look improved. Using a total contact cast on the left 6/16; complex wounds on his bilateral plantar heels which were initially pressure injury from a stay in the ICU with COVID. We have been using silver alginate most recently. His dimensions of come in quite dramatically however not recently. We have been putting the left foot in a total contact cast 6/23; complex wounds on the bilateral plantar heels. I been putting the left in the cast paradoxically the area on the right is the one that is going towards closure at a faster rate. Quite a bit of drainage on the left. The patient went [name] see Dr. Logan Bores who said he was going [name] standby for skin grafts. I had actually considered sending him for skin grafts however he would be mandatorily off his feet for a period of weeks [name] months. I am thinking that the area on the right is going [name] close on its own the area on the left has been more stubborn even though we have him in a total contact cast 6/30; took him out of a total contact cast last week is the right heel seem [name] be making better progress than the left where I was placing the cast. We are using silver alginate. Both wounds are smaller right greater than left 7/12; both wounds look as though they are making some progress. We are using silver alginate. Heel offloading boots 7/26; very gradual progress especially on  the right. Using silver alginate. He is wearing heel offloading boots 8/18; he continues [name] close these wounds down very gradually. Using  silver alginate. The problem polymen being definitive about this is areas of what appears [name] be callus around the margins. This is not a 100% of the area but certainly sizable especially on the right 9/1; bilateral plantar feet wounds secondary [name] prolonged pressure while being ventilated for COVID-19 in an upright position. Essentially pressure ulcers on the bottom of his feet. He is made substantial progress using silver alginate. 9/14; bilateral plantar feet wounds secondary [name] prolonged pressure. Making progress using silver alginate. 9/29 bilateral plantar feet wounds secondary [name] prolonged pressure. I changed him [name] Iodoflex last week. MolecuLight showing reddened blush fluorescence 10/11; patient presents for follow-up. He has no issues or complaints today. He denies signs of infection. He continues [name] use Iodoflex and antibiotic ointment [name] the wound beds. 10/27; 2-week follow-up. No evidence of infection. He has callus and thick dry skin around the wound margins we have been using Iodoflex and Bactroban which was in response [name] a moderate left MolecuLight reddish blush fluorescence. 11/10; 2-week follow-up. Wound margins again have thick callus however the measurements of the actual wound sites are a lot smaller. Everything looks reasonably healthy here. We have been using Iodoflex He was approved for prime matrix but I have elected [name] delay this given the improvement in the surface area. Hopefully I will not regret that decision as were getting close [name] the end of the year in terms of insurance payment 12/8; 2-week follow-up. Wounds are generally smaller in size. These were initially substantial wounds extending into the forefoot all the way into the heel on the bilateral plantar feet. They are now both located on the plantar heel distal aspect both of these have a lot of callus around the wounds I used a #5 curette [name] remove this on the right and the left also some subcutaneous debris [name]  try and get the wound edges were using Iodoflex. He has heel offloading shoe 12/22; 2-week follow-up. Not really much improvement. He has thick callus around the outer edges of both wounds. I remove this there is some nonviable subcutaneous tissue as well. We have been using Iodoflex. Her intake nurse and myself spontaneously thought of a total contact cast I went back in May. At that time we really were not seeing much of an improvement with a cast although the wound was in a much different situation I would like [name] retry this in 2 weeks and I discussed this with the patient 08/12/2021; the patient has had some improvement with the Iodoflex. The the area on the left heel plantar more improved than the right. I had [name] put him in a total contact cast on the left although I decided [name] put that off for 2 weeks. I am going [name] change his primary dressing [name] silver collagen. I think in both areas he has had some improvement most of the healing seems [name] be more proximal in the heel. The wounds are in the mid aspect. A lot of thick callus on the right heel however. 1/19; we are using silver collagen on both plantar heel areas. He has had some improvement today. The left did not require any debridement. He still had some eschar on the right that was debrided but both seem [name] have contracted. I did not put it total contact cast on him today 2/2 we have been using silver collagen. The area  on the right plantar heel has areas that appear [name] be epithelialized interspersed with dry flaking callus and dry skin. I removed this. This really looks better than on the other side. On the left still a large area with raised edges and debris on the surface. 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 He has no issues or complaints today. 2/9; not much change in the left foot wound with 1 week of a  cast we are using silver collagen. Silver collagen on the right side. The right side has been the better wound surface. We will reapply the total contact cast on the left 2/16; not much improvement on either side I been using silver collagen with a total contact cast on the left. I'm changing the Hydrofera Blue still with a total contact cast on the left 2/23; some improvement on both sides. Disappointing that he has thick callus around the area that we are putting in a total contact cast on the left. We've been using Hydrofera Blue on both wound areas. This is a man who at essentially pressure ulcers in addition [name] ischemia caused by medications [name] support his blood pressure (pressors) in the ICU. He was being ventilated in the standing position for severe Covid. A Shiley the wounds extended across his entire foot but are now localized [name] his plantar heels bilaterally. We have made progress however neither areas healed. I continue [name] think the total contact cast is helped albeit painstakingly slowly. He has never wanted a plastic surgery consult although I don't know that they would be interested in grafting in area in this location. 10/07/2021: Continued improvement bilaterally. There is still some callus around the left wound, despite the total contact cast. He has some increased pain in his right midfoot around 1 particular area. This has been painful in the past but seems [name] be a little bit worse. When his cast was removed today, there was an area on the heel of the left foot that looks a bit macerated. He is also complaining of pain in his left thigh and hip which he thinks is secondary [name] the limb length [name], [name] ([id number]) [phone].pdf Page 10 of 15 discrepancy caused by the cast. 10/14/2021: He continues [name] improve. A little bit less callus around the left wound. He continues [name] endorse pain in his right midfoot, but this is not as significant as it was last  week. The maceration on his left heel is improved. 10/21/2021: Continued improvement [name] both wounds. The maceration on his left heel is no longer evident. Less callus bilaterally. Epithelialization progressing. 10/28/2021: Significant improvement this week. The right sided wound is nearly closed with just a small open area at the middle. No maceration seen on the left heel. Continued epithelialization on both sides. No concern for infection. 11/04/2021: T oday, the wounds were measured a little bit differently and come out as larger, but I actually think they are about the same [name] potentially even smaller, particularly on the left. He continues [name] accumulate some callus on the right. 11/11/2021: T oday, the patient is expressing some concern that the left wound, despite being in the total contact cast, is not progressing at the same rate as the 1 on the right. He is interested in trying a week without the cast [name] see how the wound does. The wounds are roughly the  same size as last week, with the right perhaps being a little bit smaller. He continues [name] build up callus on both sites. 11/18/2021: Last week, I permitted the patient [name] [name] without his total contact cast, just [name] see if the cast was really making any difference. Today, both wounds have deteriorated [name] some extent, suggesting that the cast is providing benefit, at least on the left. Both are larger and have accumulated callus, slough, and other debris. 11/26/2021: I debrided both wounds quite aggressively last week in an effort [name] stimulate the healing cascade. This appears [name] have been effective as the left sided wound is a full centimeter shorter in length. Although the right was measured slightly larger, on inspection, it looks as though an area of epithelialized tissue was included in the measurements. We have been using PolyMem Ag on the wound surfaces with a total contact cast [name] the left. 12/02/2021: It appears that the intake personnel are  including epithelialized tissue in his wound measurements; the right wound is almost completely epithelialized; there is just a crater at the proximal midfoot with some open areas. On the left, he has built up some callus, but the overall wound surface looks good. There is some senescent skin around the wound margin. He has been in PolyMem Ag bilaterally with a total contact cast on the left. 12/09/2021: The right wound is nearly closed; there is just a small open area at the mid calcaneus. On the left, the wound is smaller with minimal callus buildup. No significant drainage. 12/16/2021: The right calcaneal wound remains minimally open at the mid calcaneus; the rest has epithelialized. On the left, the wound is also a little bit smaller. There is some senescent tissue on the periphery. He is getting his first application of a trial skin substitute called Vendaje today. 12/23/2021: The wound on his right calcaneus is nearly closed; there is just a small area at the most distal aspect of the calcaneus that is open. On the left, the area where we applied [name] the skin substitute has a healthier-looking bed of granulation tissue. The wound dimensions are not significantly different on this side but the wound surface is improved. 12/30/2021: The wound on the right calcaneus has not changed significantly aside from some accumulation of callus. On the left, the open area is smaller and continues [name] have an improved surface. He continues [name] accumulate callus around the wound. He is here for his third application of Vendaje. 01/06/2022: The right calcaneal wound is down [name] just a couple of millimeters. He continues [name] accumulate periwound callus. He unfortunately got his cast wet earlier in the week and his left foot is macerated, resulting in some superficial skin loss just distal [name] the open wound. The open wound itself, however, is much smaller and has a healthier appearing surface. He is here for his fourth  application of Vendaje. 01/13/2022: The right calcaneal wound is about the same. Unfortunately, once again, his cast got wet and his foot is again macerated. This is caused the left calcaneal wound [name] enlarge. He is here for his fifth application of Vendaje. 01/20/2022: The right calcaneal wound is very small. There is some periwound callus accumulation. He purchased a new cast protector last week and this has been effective in avoiding the maceration that has been occurring on the left. The left calcaneal wound is narrower and has a healthy and viable-appearing surface. He is here for his 6 application of Vendaje. 01/27/2022: The right calcaneal wound is  down [name] just a pinhole. There is some periwound slough and callus. On the left, the wound is narrower and shorter by about a centimeter. The surface is robust and viable-appearing. Unfortunately, the rep for the trial skin substitute product did not provide any for [place] [name] use today. 02/04/2022: The right calcaneal wound remains unchanged. There is more accumulated callus. On the left, although the intake nurse measured it a little bit longer, it looks about the same [name] me. The surface has a layer of slough, but underneath this, there is good granulation tissue. 02/10/2022: The right calcaneus wound is nearly closed. There is still some callus that builds up around the site. The left side looks about the same in terms of dimensions, but the surface is more robust and vital-appearing. 02/16/2022: The area of the right calcaneus that was nearly closed last week has closed, but there is a small opening at the mid foot where it looks like some moisture got retained and caused some reopening. The left foot wound is narrower and shallower. Both sites have a fair amount of periwound callus and eschar. 02/24/2022: The small midfoot opening on the right calcaneus is a little bit smaller today. The left foot wound is narrower and shallower. He continues [name] accumulate  periwound callus. No concern for infection. 03/01/2022: The patient came [name] clinic early because he showered and got his cast wet. Fortunately, there is no significant maceration [name] his foot but the callus softened and it looks like the wound on his left calcaneus may be a little bit wider. The wound on his right calcaneus is just a narrow slit. Continued accumulation of periwound callus bilaterally. 03/08/2022: The wound on his right calcaneus is very nearly closed, just a small pinpoint opening under a bit of eschar; the left wound has come in quite a bit, as well. It is narrower and shorter than at our last visit. Still with accumulated callus and eschar bilaterally. 03/17/2022: The right calcaneal wound is healed. The left wound is smaller and the surface itself is very clean, but there is some blue-green staining on the periwound callus, concerning for Pseudomonas aeruginosa. 03/23/2022: The right calcaneal wound remains closed. The left wound continues [name] contract. No further blue-green staining. Small amount of callus and slough accumulation. 03/28/2022: He came in early today because he had gotten his cast wet. On inspection, the wound itself did not get wet or macerated, just a little bit of the forefoot. The wound itself is basically unchanged. 04/07/2022: The right foot wound remains closed. The left wound is the smallest that I have seen it [name] date. It is narrower and shorter. It still continues [name] accumulate slough on the surface. 04/15/2022: There is a band of epithelium now dividing the small left plantar foot wound in 2. There is still some slough on the surface. 04/21/2022: The wound continues [name] narrow. Just a little bit of slough on the surface. He seems [name] be responding well [name] endoform. [name], [name] ([id number]) [id number] Page 11 of 15 04/28/2022: Continued slow contraction of the wound. There is a little slough on the surface and some periwound callus. We  have been using endoform and total contact cast. 05/05/2022: The wound appears [name] have stalled. There is slough and some periwound eschar/callus. No concern for infection, however. 05/12/2022: Unfortunately, his right foot has reopened. It is located at the most posterior aspect of his surgical incision. The area was noted [name] have drainage coming from it when his padding was  removed today. Underneath some callus and senescent skin, there is an opening. No purulent drainage or malodor. On the left foot, the wound is again unchanged. There is some light blue staining on the callus, but no malodor or purulent drainage. 10/13; right and left heel remanence of extensive plantar foot wounds. These are better than I remember by quite a big margin however he is still left with wounds on the left plantar heel and the right plantar heel. Been using endoform bilaterally. A culture was done that showed apparently Pseudomonas but we are still waiting for the [facility] antibiotic [name] use gentamicin today. He is still very active by description I am not sure about the offloading of his noncasted right foot 10/20; both wounds right and left heel debrided not much change from last week. [name] Echevaria has arrived which is linezolid, gentamicin and ciprofloxacin we will use this with endoform. T contact cast on the left otal 06/02/2022: Both wounds are smaller today. There is still a fair amount of callus buildup around the right foot ulcer. The left is more superficial and nearly flush with the surrounding tissues. Also with slough and eschar buildup. 06/10/2022: The right sided wound appears [name] be nearly closed, if not completely so, although it is somewhat difficult [name] tell given the abnormal tissue and scarring in his foot. There is a fair amount of callus and crust accumulation. On the left, the wound looks about the same, again with callus and slough. He has an appointment next Thursday with Dr. Annamary Rummage in podiatry; I  am hopeful that there may be some reconstructive options available for Mr. Bair. 06/16/2022: Both wounds have some eschar and callus accumulation. The right sided wound is extremely narrow and barely open; the left is narrower than last week. There is a little bit of slough. He has his appointment in podiatry later today. 06/23/2022: The patient met with Dr. Annamary Rummage last week and unfortunately, there are no reconstructive options that he believes would be helpful. He did order an MRI [name] evaluate for osteomyelitis and fortunately, none was seen. The left sided wound is a little bit shorter and narrower today. The right sided wound is about the same. There is callus and eschar accumulation bilaterally. 06/29/2022: Both feet have improved from last week. There is epithelium making a valiant effort [name] creep across the surface on the left. The right side looks like it got a little dry and the deep crevasse in his midfoot has cracked. Both have eschar and there is some slough on the left. 07/07/2022: Both feet have improved. There is epithelium completely covering the calcaneus on the right with just a small opening in the crevasse in his midfoot. On the left, the open area of tissue is smaller but he continues [name] build up callus/eschar and slough. 07/15/2022: The opening in the midfoot on the right is about the same size, covered with eschar and a little bit of slough. The open portion of the left wound is narrower and shorter with a bit of slough buildup. He admits [name] being on his feet more than recommended. 12/14; as far as I can tell everything on the right foot is closed. There is some eschar I removed some of this I cannot identify any open wound here. As usual this will be a very vulnerable area going forward. On the left this looks really quite healthy. I was pleasantly surprised [name] see how good this looked. Wound is certainly smaller and there appears [name] be healthy epithelialization.  He has  been using Promogran on the right and endoform on the left. He has been offloading the right foot with a heel offloading boot and he has a running shoe on the right foot 08/04/2022: The right foot remains closed. He has a thick cushioned insole in his sneaker. The left sided wound is smaller with just some slough and eschar accumulation. He is wearing the heel off loader on this foot. 08/15/2022: The right foot remains closed. The left wound has narrowed further. There is some slough and eschar accumulation. 08/25/2022: We put him in a peg assist shoe insert and as a result, he has more epithelialization of the ulcer with minimal slough and eschar accumulation. 09/01/2022: The wound is smaller by about half this week. Still with some slough on the surface. The peg assist shoe seems [name] be doing a remarkable job of adequately offloading the site. 09/08/2022: There is a little bit more epithelium coming in. There is some slough and callus buildup. Patient History Information obtained from Patient. Family History Cancer - Maternal Grandparents, Diabetes - Father,Paternal Grandparents, Heart Disease - Maternal Grandparents, Hypertension - Father,Paternal Grandparents, Lung Disease - Siblings, No family history of Hereditary Spherocytosis, Kidney Disease, Seizures, Stroke, Thyroid Problems, Tuberculosis. Social History Never smoker, Marital Status - Married, Alcohol Use - Never, Drug Use - No History, Caffeine Use - Daily - tea, soda. Medical History Eyes Denies history of Cataracts, Glaucoma, Optic Neuritis Ear/Nose/Mouth/Throat Denies history of Chronic sinus problems/congestion, Middle ear problems Hematologic/Lymphatic Denies history of Anemia, Hemophilia, Human Immunodeficiency Virus, Lymphedema, Sickle Cell Disease Respiratory Patient has history of Asthma Denies history of Aspiration, Chronic Obstructive Pulmonary Disease (COPD), Pneumothorax, Sleep Apnea, Tuberculosis Cardiovascular Patient  has history of Angina - with COVID, Hypertension Denies history of Arrhythmia, Congestive Heart Failure, Coronary Artery Disease, Deep Vein Thrombosis, Hypotension, Myocardial Infarction, Peripheral Arterial Disease, Peripheral Venous Disease, Phlebitis, Vasculitis Gastrointestinal Denies history of Cirrhosis , Colitis, Crohnoos, Hepatitis A, Hepatitis B, Hepatitis C Endocrine Denies history of Type I Diabetes, Type II Diabetes Genitourinary Denies history of End Stage Renal Disease Immunological Denies history of Lupus Erythematosus, Raynaudoos, Scleroderma [name], [name] ([id number]) [id number] Page 12 of 15 Integumentary (Skin) Denies history of History of Burn Musculoskeletal Denies history of Gout, Rheumatoid Arthritis, Osteoarthritis, Osteomyelitis Neurologic Denies history of Dementia, Neuropathy, Quadriplegia, Paraplegia, Seizure Disorder Oncologic Denies history of Received Chemotherapy, Received Radiation Psychiatric Denies history of Anorexia/bulimia, Confinement Anxiety Hospitalization/Surgery History - COVID PNA 07/22/2019- 11/14/2019. - 03/27/2020 wound debridement/ skin graft. Medical A Surgical History Notes nd Constitutional Symptoms (General Health) COVID PNA 07/22/2019-11/14/2019 [facility] ECMO, foot drop left foot , Genitourinary kidney stone Psychiatric anxiety Objective Constitutional Hypertensive, asymptomatic. no acute distress. Vitals Time Taken: 10:04 AM, Height: 69 in, Weight: 280 lbs, BMI: 41.3, Temperature: 98.5 F, Pulse: 83 bpm, Respiratory Rate: 16 breaths/min, Blood Pressure: 148/82 mmHg. Respiratory Normal work of breathing on room air. General Notes: 09/08/2022: There is a little bit more epithelium coming in. There is some slough and callus buildup. Integumentary (Hair, Skin) Wound #2 status is Open. Original cause of wound was Pressure Injury. The date acquired was: 10/07/2019. The wound has been in treatment 112  weeks. The wound is located on the Left Calcaneus. The wound measures 1.2cm length x 0.4cm width x 0.1cm depth; 0.377cm^2 area and 0.038cm^3 volume. There is Fat Layer (Subcutaneous Tissue) exposed. There is no tunneling or undermining noted. There is a medium amount of serosanguineous drainage noted. The wound margin is thickened. There is large (67-100%) red, pink  granulation within the wound bed. There is a small (1-33%) amount of necrotic tissue within the wound bed including Adherent Slough. The periwound skin appearance had no abnormalities noted for color. The periwound skin appearance exhibited: Callus, Scarring, Maceration. Periwound temperature was noted as No Abnormality. Assessment Active Problems ICD-10 Non-pressure chronic ulcer of other part of left foot with fat layer exposed Procedures Wound #2 Pre-procedure diagnosis of Wound #2 is a Pressure Ulcer located on the Left Calcaneus . There was a Selective/Open Wound Non-Viable Tissue Debridement with a total area of 0.48 sq cm performed by [name], MD. With the following instrument(s): Curette [name] remove Non-Viable tissue/material. Material removed includes Callus and Slough and after achieving pain control using Lidocaine 5% topical ointment. No specimens were taken. A time out was conducted at 10:15, prior [name] the start of the procedure. A Minimum amount of bleeding was controlled with Pressure. The procedure was tolerated well with a pain level of 0 throughout and a pain level of 0 following the procedure. Post Debridement Measurements: 1.2cm length x 0.4cm width x 0.1cm depth; 0.038cm^3 volume. Post debridement Stage noted as Category/Stage III. Character of Wound/Ulcer Post Debridement is improved. Post procedure Diagnosis Wound #2: Same as Pre-Procedure General Notes: Scribed for Dr. Lady Gary by J.Scotton. Plan [name], [name] ([id number]) [id number] Page 13 of 15 Follow-up  Appointments: Return Appointment in 1 week. - Dr. Lady Gary Room 3 Anesthetic: Wound #2 Left Calcaneus: (In clinic) Topical Lidocaine 4% applied [name] wound bed - In clinic Edema Control - Lymphedema / SCD / Other: Avoid standing for long periods of time. Moisturize legs daily. - as needed Off-Loading: Other: - keep pressure off of the bottom of your feet. Elevate legs throughout the day. Use the Shoe with the PegAssist off-loading insole Additional Orders / Instructions: Follow Nutritious Diet - Try [name] get 70-100 grams of Protein a day+ WOUND #2: - Calcaneus Wound Laterality: Left Cleanser: Normal Saline (Generic) Every Other Day/30 Days Discharge Instructions: Cleanse the wound with Normal Saline prior [name] applying a clean dressing using gauze sponges, not tissue or cotton balls. Cleanser: Wound Cleanser (Generic) Every Other Day/30 Days Discharge Instructions: Cleanse the wound with wound cleanser prior [name] applying a clean dressing using gauze sponges, not tissue or cotton balls. Prim Dressing: Endoform 2x2 in (Generic) Every Other Day/30 Days ary Discharge Instructions: Moisten with saline or Hydrogel Secondary Dressing: Optifoam Non-Adhesive Dressing, 4x4 in (Generic) Every Other Day/30 Days Discharge Instructions: Apply over primary dressing as directed. Secondary Dressing: Woven Gauze Sponge, Non-Sterile 4x4 in (Generic) Every Other Day/30 Days Discharge Instructions: Apply over primary dressing as directed. Secured With: 88M Medipore H Soft Cloth Surgical T ape, 4 x 10 (in/yd) (Generic) Every Other Day/30 Days Discharge Instructions: Secure with tape as directed. Com pression Wrap: Kerlix Roll 4.5x3.1 (in/yd) Every Other Day/30 Days Discharge Instructions: Apply Kerlix and Coban compression as directed. 09/08/2022: There is a little bit more epithelium coming in. There is some slough and callus buildup. I used a curette [name] debride slough and callus from the wound site. We will continue [name]  use his Keystone topical antibiotic compound and endoform on his wound. He will continue [name] use the peg assist insole for offloading. Follow-up in 1 week. Electronic Signature(s) Signed: 09/08/2022 10:36:56 AM By: [name] MD FACS Entered By: [name] on 09/08/2022 10:36:56 -------------------------------------------------------------------------------- HxROS Details Patient Name: Date of Service: [name], [name] [place] E. 09/08/2022 10:15 A M Medical Record Number: [id number] Patient Account Number: [id number] Date of Birth/Sex:  Treating RN: 1976/05/02 (46 y.o. M) Primary Care Provider: Dorinda Hill Other Clinician: Referring Provider: Treating Provider/Extender: Jana Hakim in Treatment: 112 Information Obtained From Patient Constitutional Symptoms (General Health) Medical History: Past Medical History Notes: COVID PNA 07/22/2019-11/14/2019 [facility] ECMO, foot drop left foot , Eyes Medical History: Negative for: Cataracts; Glaucoma; Optic Neuritis Ear/Nose/Mouth/Throat Medical History: Negative for: Chronic sinus problems/congestion; Middle ear problems Hematologic/Lymphatic Medical HistoryHERO, KULISH ([id number]) [id number] Page 14 of 15 Negative for: Anemia; Hemophilia; Human Immunodeficiency Virus; Lymphedema; Sickle Cell Disease Respiratory Medical History: Positive for: Asthma Negative for: Aspiration; Chronic Obstructive Pulmonary Disease (COPD); Pneumothorax; Sleep Apnea; Tuberculosis Cardiovascular Medical History: Positive for: Angina - with COVID; Hypertension Negative for: Arrhythmia; Congestive Heart Failure; Coronary Artery Disease; Deep Vein Thrombosis; Hypotension; Myocardial Infarction; Peripheral Arterial Disease; Peripheral Venous Disease; Phlebitis; Vasculitis Gastrointestinal Medical History: Negative for: Cirrhosis ; Colitis; Crohns; Hepatitis A; Hepatitis B; Hepatitis C Endocrine Medical  History: Negative for: Type I Diabetes; Type II Diabetes Genitourinary Medical History: Negative for: End Stage Renal Disease Past Medical History Notes: kidney stone Immunological Medical History: Negative for: Lupus Erythematosus; Raynauds; Scleroderma Integumentary (Skin) Medical History: Negative for: History of Burn Musculoskeletal Medical History: Negative for: Gout; Rheumatoid Arthritis; Osteoarthritis; Osteomyelitis Neurologic Medical History: Negative for: Dementia; Neuropathy; Quadriplegia; Paraplegia; Seizure Disorder Oncologic Medical History: Negative for: Received Chemotherapy; Received Radiation Psychiatric Medical History: Negative for: Anorexia/bulimia; Confinement Anxiety Past Medical History Notes: anxiety Immunizations Pneumococcal Vaccine: Received Pneumococcal Vaccination: No Implantable Devices None Hospitalization / Surgery History Type of Hospitalization/Surgery COVID PNA 07/22/2019- 11/14/2019 03/27/2020 wound debridement/ skin graft Family and Social History [name], [name] ([id number]) [id number] Page 15 of 15 Cancer: Yes - Maternal Grandparents; Diabetes: Yes - Father,Paternal Grandparents; Heart Disease: Yes - Maternal Grandparents; Hereditary Spherocytosis: No; Hypertension: Yes - Father,Paternal Grandparents; Kidney Disease: No; Lung Disease: Yes - Siblings; Seizures: No; Stroke: No; Thyroid Problems: No; Tuberculosis: No; Never smoker; Marital Status - Married; Alcohol Use: Never; Drug Use: No History; Caffeine Use: Daily - tea, soda; Financial Concerns: No; Food, Clothing or Shelter Needs: No; Support System Lacking: No; Transportation Concerns: No Electronic Signature(s) Signed: 09/08/2022 10:37:55 AM By: [name] MD FACS Entered By: [name] on 09/08/2022 10:32:28 -------------------------------------------------------------------------------- SuperBill Details Patient Name: Date of  Service: [name], [name] [name] 09/08/2022 Medical Record Number: [id number] Patient Account Number: [id number] Date of Birth/Sex: Treating RN: 08/13/1975 (46 y.o. M) Primary Care Provider: Dorinda Hill Other Clinician: Referring Provider: Treating Provider/Extender: Jana Hakim in Treatment: 112 Diagnosis Coding ICD-10 Codes Code Description [phone] Non-pressure chronic ulcer of other part of left foot with fat layer exposed Facility Procedures : CPT4 Code: [id number] Description: 97597 - DEBRIDE WOUND 1ST 20 SQ CM OR < ICD-10 Diagnosis Description L97.522 Non-pressure chronic ulcer of other part of left foot with fat layer exposed Modifier: Quantity: 1 Physician Procedures : CPT4 Code Description Modifier [id number] - WC PHYS LEVEL 4 - EST PT 25 ICD-10 Diagnosis Description L97.522 Non-pressure chronic ulcer of other part of left foot with fat layer exposed Quantity: 1 : [id number] 97597 - WC PHYS DEBR WO ANESTH 20 SQ CM ICD-10 Diagnosis Description L97.522 Non-pressure chronic ulcer of other part of left foot with fat layer exposed Quantity: 1 Electronic Signature(s) Signed: 09/08/2022 10:37:20 AM By: [name] MD FACS Entered By: [name] on 09/08/2022 10:37:19

## 2022-09-08 NOTE — Progress Notes (Signed)
JAC, ROMULUS ([id number]) [phone].pdf Page 1 of 7 Visit Report for 09/08/2022 Arrival Information Details Patient Name: Date of Service: [name], [name] 09/08/2022 10:15 A M Medical Record Number: [id number] Patient Account Number: [id number] Date of Birth/Sex: Treating RN: 25-Apr-1976 (47 y.o. M) Primary Care Chanteria Haggard: [name] Other Clinician: Referring Welford Christmas: Treating Shawnmichael Parenteau/Extender: [name] in Treatment: 72 Visit Information History Since Last Visit All ordered tests and consults were completed: No Patient Arrived: Wheel Chair Added or deleted any medications: No Arrival Time: 10:03 Any new allergies or adverse reactions: No Accompanied By: wife Had a fall or experienced change in No Transfer Assistance: None activities of daily living that may affect Patient Identification Verified: Yes risk of falls: Secondary Verification Process Completed: Yes Signs or symptoms of abuse/neglect since last visito No Patient Requires Transmission-Based Precautions: No Hospitalized since last visit: No Patient Has Alerts: No Implantable device outside of the clinic excluding No cellular tissue based products placed in the center since last visit: Pain Present Now: No Electronic Signature(s) Signed: 09/08/2022 3:28:28 PM By: Worthy Rancher Entered By: Worthy Rancher on 09/08/2022 10:04:23 -------------------------------------------------------------------------------- Encounter Discharge Information Details Patient Name: Date of Service: [name] 09/08/2022 10:15 A M Medical Record Number: [id number] Patient Account Number: [id number] Date of Birth/Sex: Treating RN: [birth year] (47 y.o. [name] Primary Care Connar Keating: [name] Other Clinician: Referring Marysue Fait: Treating Nyleah Mcginnis/Extender: [name] in Treatment: 112 Encounter Discharge Information Items Post Procedure  Vitals Discharge Condition: Stable Temperature (F): 98.5 Ambulatory Status: Wheelchair Pulse (bpm): 83 Discharge Destination: Home Respiratory Rate (breaths/min): 16 Transportation: Private Auto Blood Pressure (mmHg): 148/82 Accompanied By: aunt Schedule Follow-up Appointment: Yes Clinical Summary of Care: Patient Declined Electronic Signature(s) Signed: 09/08/2022 10:59:12 AM By: [name] Entered By: [name] on 09/08/2022 10:58:47 [name] ([id number]) [id number] Page 2 of 7 -------------------------------------------------------------------------------- Lower Extremity Assessment Details Patient Name: Date of Service: [name] [place] 09/08/2022 10:15 A M Medical Record Number: [id number] Patient Account Number: [id number] Date of Birth/Sex: Treating RN: 05/17/1976 (47 y.o. [name] Primary Care Quinesha Selinger: [name] Other Clinician: Referring Breylan Lefevers: Treating Abigaile Rossie/Extender: [name] in Treatment: 112 Edema Assessment Assessed: [name]: No] [name]: No] Edema: [Left: N] [Right: o] Calf Left: Right: Point of Measurement: 29 cm From Medial Instep 44.1 cm 44.5 cm Ankle Left: Right: Point of Measurement: 9 cm From Medial Instep 24.7 cm 25 cm Vascular Assessment Pulses: Dorsalis Pedis Palpable: [Left:Yes] [Right:Yes] Electronic Signature(s) Signed: 09/08/2022 10:59:12 AM By: [name] Entered By: [name] on 09/08/2022 10:17:17 -------------------------------------------------------------------------------- Multi Wound Chart Details Patient Name: Date of Service: [name], [name] 09/08/2022 10:15 A M Medical Record Number: [id number] Patient Account Number: [id number] Date of Birth/Sex: Treating RN: [birth year] (47 y.o. M) Primary Care [name]: [name] Other Clinician: Referring Erika Slaby: Treating Esbeydi Manago/Extender: [name] in Treatment:  112 Vital Signs Height(in): 69 Pulse(bpm): 83 Weight(lbs): 280 Blood Pressure(mmHg): 148/82 Body Mass Index(BMI): 41.3 Temperature(F): 98.5 Respiratory Rate(breaths/min): 16 [2:Photos:] [N/A:N/A] Left Calcaneus N/A N/A Wound Location: Pressure Injury N/A N/A Wounding Event: Pressure Ulcer N/A N/A Primary Etiology: Asthma, Angina, Hypertension N/A N/A Comorbid History: 10/07/2019 N/A N/A Date Acquired: 112 N/A N/A Weeks of Treatment: Open N/A N/A Wound Status: No N/A N/A Wound Recurrence: 1.2x0.4x0.1 N/A N/A Measurements L x W x D (cm) 0.377 N/A N/A A (cm) : rea 0.038 N/A N/A Volume (cm) : 98.60% N/A N/A %  Reduction in A rea: 99.90% N/A N/A % Reduction in Volume: Category/Stage III N/A N/A Classification: Medium N/A N/A Exudate A mount: Serosanguineous N/A N/A Exudate Type: red, brown N/A N/A Exudate Color: Thickened N/A N/A Wound Margin: Large (67-100%) N/A N/A Granulation A mount: Red, Pink N/A N/A Granulation Quality: Small (1-33%) N/A N/A Necrotic A mount: Fat Layer (Subcutaneous Tissue): Yes N/A N/A Exposed Structures: Fascia: No Tendon: No Muscle: No Joint: No Bone: No Medium (34-66%) N/A N/A Epithelialization: Debridement - Selective/Open Wound N/A N/A Debridement: Pre-procedure Verification/Time Out 10:15 N/A N/A Taken: Lidocaine 5% topical ointment N/A N/A Pain Control: Callus, Slough N/A N/A Tissue Debrided: Non-Viable Tissue N/A N/A Level: 0.48 N/A N/A Debridement A (sq cm): rea Curette N/A N/A Instrument: Minimum N/A N/A Bleeding: Pressure N/A N/A Hemostasis A chieved: 0 N/A N/A Procedural Pain: 0 N/A N/A Post Procedural Pain: Procedure was tolerated well N/A N/A Debridement Treatment Response: 1.2x0.4x0.1 N/A N/A Post Debridement Measurements L x W x D (cm) 0.038 N/A N/A Post Debridement Volume: (cm) Category/Stage III N/A N/A Post Debridement Stage: Callus: Yes N/A N/A Periwound Skin Texture: Scarring:  Yes Maceration: Yes N/A N/A Periwound Skin Moisture: No Abnormalities Noted N/A N/A Periwound Skin Color: No Abnormality N/A N/A Temperature: Debridement N/A N/A Procedures Performed: Treatment Notes Electronic Signature(s) Signed: 09/08/2022 10:31:41 AM By: Fredirick Maudlin MD FACS Entered By: Fredirick Maudlin on 09/08/2022 10:31:41 -------------------------------------------------------------------------------- Multi-Disciplinary Care Plan Details Patient Name: Date of Service: [name], [place] 09/08/2022 10:15 A M Medical Record Number: [id number] Patient Account Number: [id number] Date of Birth/Sex: Treating RN: 04-17-1976 (47 y.o. [name] Primary Care Dalyce Renne: [name] Other Clinician: Referring Arnie Maiolo: Treating [place] Crance/Extender: [name] in Treatment: [place] reviewed with physician [address] LINDBERGH, WINKLES ([id number]) [id number] Page 4 of 7 Wound/Skin Impairment Nursing Diagnoses: Impaired tissue integrity Knowledge deficit related to ulceration/compromised skin integrity Goals: Patient/caregiver will verbalize understanding of skin care regimen Date Initiated: 07/15/2020 Target Resolution Date: 03/08/2023 Goal Status: Active Ulcer/skin breakdown will have a volume reduction of 30% by week 4 Date Initiated: 07/15/2020 Date Inactivated: 08/20/2020 Target Resolution Date: 09/03/2020 Goal Status: Unmet Unmet Reason: no major changes. Ulcer/skin breakdown will heal within 14 weeks Date Initiated: 12/04/2020 Date Inactivated: 12/10/2020 Target Resolution Date: 12/10/2020 Unmet Reason: wounds still open at 14 Goal Status: Unmet weeks and today 21 weeks. Interventions: Assess patient/caregiver ability to obtain necessary supplies Assess patient/caregiver ability to perform ulcer/skin care regimen upon admission and as needed Assess ulceration(s) every visit Provide education  on ulcer and skin care Treatment Activities: Skin care regimen initiated : 07/15/2020 Topical wound management initiated : 07/15/2020 Notes: Electronic Signature(s) Signed: 09/08/2022 10:59:12 AM By: [name] Entered By: [name] on 09/08/2022 10:57:14 -------------------------------------------------------------------------------- Pain Assessment Details Patient Name: Date of Service: [name], [place] 09/08/2022 10:15 A M Medical Record Number: [id number] Patient Account Number: [id number] Date of Birth/Sex: Treating RN: 1975-09-01 (47 y.o. M) Primary Care Nathifa Ritthaler: [name] Other Clinician: Referring Samar Venneman: Treating Alegandra Sommers/Extender: [name] in Treatment: 112 Active Problems Location of Pain Severity and Description of Pain Patient Has Paino No Site Locations Pain Management and Medication [name], [name] ([id number]) [phone].pdf Page 5 of 7 Current Pain Management: Electronic Signature(s) Signed: 09/08/2022 10:59:12 AM By: [name] Entered By: [name] on 09/08/2022 10:17:10 -------------------------------------------------------------------------------- Patient/Caregiver Education Details Patient Name: Date of Service: [name], [place] 2/1/2024andnbsp10:15 [place] Record Number: [id number] Patient Account Number: [id number]  Date of Birth/Gender: Treating RN: 1976/01/23 (47 y.o. [name] Primary Care Physician: [name] Other Clinician: Referring Physician: Treating Physician/Extender: [name] in Treatment: 112 Education Assessment Education Provided To: Patient Education Topics Provided Electronic Signature(s) Signed: 09/08/2022 10:59:12 AM By: [name] Entered By: [name] on 09/08/2022 10:57:27 -------------------------------------------------------------------------------- Wound Assessment Details Patient Name: Date  of Service: [name], [place] 09/08/2022 10:15 A M Medical Record Number: [id number] Patient Account Number: [id number] Date of Birth/Sex: Treating RN: 1976/01/07 (47 y.o. M) Primary Care Matson Welch: [name] Other Clinician: Referring Loranda Mastel: Treating Laurelai Lepp/Extender: [name] in Treatment: 112 Wound Status Wound Number: 2 Primary Etiology: Pressure Ulcer Wound Location: Left Calcaneus Wound Status: Open Wounding Event: Pressure Injury Comorbid History: Asthma, Angina, Hypertension Date Acquired: 10/07/2019 Weeks Of Treatment: 112 Clustered Wound: No Photos ZAKAI, GONYEA ([id number]) [id number] Page 6 of 7 Wound Measurements Length: (cm) 1.2 Width: (cm) 0.4 Depth: (cm) 0.1 Area: (cm) 0.377 Volume: (cm) 0.038 % Reduction in Area: 98.6% % Reduction in Volume: 99.9% Epithelialization: Medium (34-66%) Tunneling: No Undermining: No Wound Description Classification: Category/Stage III Wound Margin: Thickened Exudate Amount: Medium Exudate Type: Serosanguineous Exudate Color: red, brown Foul Odor After Cleansing: No Slough/Fibrino Yes Wound Bed Granulation Amount: Large (67-100%) Exposed Structure Granulation Quality: Red, Pink Fascia Exposed: No Necrotic Amount: Small (1-33%) Fat Layer (Subcutaneous Tissue) Exposed: Yes Necrotic Quality: Adherent Slough Tendon Exposed: No Muscle Exposed: No Joint Exposed: No Bone Exposed: No Periwound Skin Texture Texture Color No Abnormalities Noted: No No Abnormalities Noted: Yes Callus: Yes Temperature / Pain Scarring: Yes Temperature: No Abnormality Moisture No Abnormalities Noted: No Maceration: Yes Treatment Notes Wound #2 (Calcaneus) Wound Laterality: Left Cleanser Normal Saline Discharge Instruction: Cleanse the wound with Normal Saline prior to applying a clean dressing using gauze sponges, not tissue or cotton balls. Wound Cleanser Discharge Instruction:  Cleanse the wound with wound cleanser prior to applying a clean dressing using gauze sponges, not tissue or cotton balls. Peri-Wound Care Topical Primary Dressing Endoform 2x2 in Discharge Instruction: Moisten with saline or Hydrogel Secondary Dressing Optifoam Non-Adhesive Dressing, 4x4 in Discharge Instruction: Apply over primary dressing as directed. Woven Gauze Sponge, Non-Sterile 4x4 in Discharge Instruction: Apply over primary dressing as directed. Secured With 12M Medipore H Soft Cloth Surgical T ape, 4 x 10 (in/yd) Discharge Instruction: Secure with tape as directed. TARVARIS, PUGLIA ([id number]) [phone].pdf Page 7 of 7 Compression Wrap Kerlix Roll 4.5x3.1 (in/yd) Discharge Instruction: Apply Kerlix and Coban compression as directed. Compression Stockings Add-Ons Electronic Signature(s) Signed: 09/08/2022 10:59:12 AM By: [name] Entered By: [name] on 09/08/2022 10:13:20 -------------------------------------------------------------------------------- Vitals Details Patient Name: Date of Service: [name], [place] 09/08/2022 10:15 A M Medical Record Number: [id number] Patient Account Number: [id number] Date of Birth/Sex: Treating RN: 1976/06/29 (47 y.o. M) Primary Care Skeeter Sheard: [name] Other Clinician: Referring Raun Routh: Treating Erich Kochan/Extender: [name] in Treatment: 112 Vital Signs Time Taken: 10:04 Temperature (F): 98.5 Height (in): 69 Pulse (bpm): 83 Weight (lbs): 280 Respiratory Rate (breaths/min): 16 Body Mass Index (BMI): 41.3 Blood Pressure (mmHg): 148/82 Reference Range: 80 - 120 mg / dl Electronic Signature(s) Signed: 09/08/2022 10:59:12 AM By: [name] Entered By: [name] on 09/08/2022 10:17:02

## 2022-09-10 DIAGNOSIS — J019 Acute sinusitis, unspecified: Secondary | ICD-10-CM | POA: Diagnosis not present

## 2022-09-13 ENCOUNTER — Encounter: Payer: Self-pay | Admitting: Behavioral Health

## 2022-09-13 ENCOUNTER — Ambulatory Visit (INDEPENDENT_AMBULATORY_CARE_PROVIDER_SITE_OTHER): Payer: PPO | Admitting: Behavioral Health

## 2022-09-13 DIAGNOSIS — F39 Unspecified mood [affective] disorder: Secondary | ICD-10-CM

## 2022-09-13 DIAGNOSIS — F331 Major depressive disorder, recurrent, moderate: Secondary | ICD-10-CM

## 2022-09-13 DIAGNOSIS — F411 Generalized anxiety disorder: Secondary | ICD-10-CM

## 2022-09-13 MED ORDER — LAMOTRIGINE 100 MG PO TABS: 100.0000 mg | ORAL_TABLET | Freq: Every day | ORAL | 3 refills | Status: DC

## 2022-09-13 MED ORDER — DULOXETINE HCL 60 MG PO CPEP: ORAL_CAPSULE | ORAL | 1 refills | Status: DC

## 2022-09-13 NOTE — Progress Notes (Signed)
[name] [id number] 1975-11-29 47 y.o.  Virtual Visit via Telephone Note  I connected with pt on 09/13/22 at  1:00 PM EST by telephone and verified that I am speaking with the correct person using two identifiers.   I discussed the limitations, risks, security and privacy concerns of performing an evaluation and management service by telephone and the availability of in person appointments. I also discussed with the patient that there may be a patient responsible charge related to this service. The patient expressed understanding and agreed to proceed.   I discussed the assessment and treatment plan with the patient. The patient was provided an opportunity to ask questions and all were answered. The patient agreed with the plan and demonstrated an understanding of the instructions.   The patient was advised to call back or seek an in-person evaluation if the symptoms worsen or if the condition fails to improve as anticipated.  I provided 20 minutes of non-face-to-face time during this encounter.  The patient was located at home.  The provider was located at [place].   Elwanda Brooklyn, NP   Subjective:   Patient ID:  [name] is a 47 y.o. (DOB Aug 15, 1975) male.  Chief Complaint:  Chief Complaint  Patient presents with   Depression   Anxiety   Follow-up   Medication Refill   Patient Education    HPI 47 year old male presents to this office via telephonic interview for follow up and medicaiton management. Says that his depression and anxiety have improved significantly. He says that he is really wanted to try to wean off his Lamictal and eventually his duloxetine. He would like to start with reducing his Lamictal this visit.  He says his depression is 3/10 and anxiety is 2/10. He is sleeping 7-8 hours per night. He would like to adjust or try another medication to help.  Denies mania, no psychosis, no current SI. No HI.    Prior psychiatric medication  trials: Auvelity- dry mouth   Review of Systems:  Review of Systems  Constitutional: Negative.   Allergic/Immunologic: Negative.   Neurological: Negative.   Psychiatric/Behavioral: Negative.      Medications: I have reviewed the patient's current medications.  Current Outpatient Medications  Medication Sig Dispense Refill   albuterol (VENTOLIN HFA) 108 (90 Base) MCG/ACT inhaler INHALE 2 PUFFS BY MOUTH EVERY 4 HOURS AS NEEDED 18 g 1   Albuterol Sulfate (PROAIR RESPICLICK) 435 (90 Base) MCG/ACT AEPB INHALE 2 PUFF(S) EVERY 4-6 HOURS BY INHALATION ROUTE FOR 30 DAYS.     amLODipine (NORVASC) 2.5 MG tablet Take 1 tablet (2.5 mg total) by mouth daily. 30 tablet 5   BREZTRI AEROSPHERE 160-9-4.8 MCG/ACT AERO Inhale 2 puffs into the lungs in the morning and at bedtime. 10.7 g 1   DULoxetine (CYMBALTA) 60 MG capsule 1 capsule Orally Once a day for 90 90 capsule 1   ergocalciferol (VITAMIN D2) 1.25 MG (50000 UT) capsule      lamoTRIgine (LAMICTAL) 100 MG tablet Take 1 tablet (100 mg total) by mouth daily. 30 tablet 3   metoprolol tartrate (LOPRESSOR) 25 MG tablet TAKE 1 TABLET BY MOUTH TWICE A DAY 180 tablet 3   montelukast (SINGULAIR) 10 MG tablet TAKE 1 TABLET BY MOUTH EVERY DAY 90 tablet 1   oxyCODONE (ROXICODONE) 5 MG immediate release tablet Take 1 tablet (5 mg total) by mouth every 6 (six) hours as needed for severe pain (max 2 tabs/day for now). Do Not Fill Before  12/ 21/2023 60 tablet 0   pantoprazole (PROTONIX) 40 MG tablet Take 1 tablet by mouth daily.     traZODone (DESYREL) 100 MG tablet Take 2 tablets (200 mg total) by mouth at bedtime. 180 tablet 3   triamcinolone (NASACORT) 55 MCG/ACT AERO nasal inhaler Place 2 sprays into the nose daily.     No current facility-administered medications for this visit.    Medication Side Effects: None  Allergies: No Known Allergies  Past Medical History:  Diagnosis Date   Anginal pain ([place])    with covid   Anxiety    Asthma    Depression     Dyspnea    GERD (gastroesophageal reflux disease)    Headache    History of kidney stones    LEFT URETERAL STONE   HTN (hypertension)    Pancreatitis 2018   GALLBALDDER SLUDGE CAUSED ISSUED RESOLVED   Pneumonia 07/2019   covid   Prediabetes    SOBOE (shortness of breath on exertion)     Family History  Problem Relation Age of Onset   Migraines Mother    GER disease Mother    Heart disease Father    Hyperlipidemia Father    Diabetes Father    Hypertension Father    Sleep apnea Father    Obesity Father    Asthma Brother    Pancreatic cancer Maternal Grandmother    Heart disease Maternal Grandfather    Diabetes Paternal Grandmother    Hypertension Paternal Grandmother     Social History   Socioeconomic History   Marital status: Married    Spouse name: Not on file   Number of children: 1   Years of education: Not on file   Highest education level: Not on file  Occupational History   Occupation: delivery driver   Occupation: Stay at home spouse  Tobacco Use   Smoking status: Never   Smokeless tobacco: Never  Vaping Use   Vaping Use: Never used  Substance and Sexual Activity   Alcohol use: Yes    Comment: rarely 2-3 times a year   Drug use: No   Sexual activity: Yes    Partners: Female  Other Topics Concern   Not on file  Social History Narrative   Not on file   Social Determinants of Health   Financial Resource Strain: Not on file  Food Insecurity: Not on file  Transportation Needs: Not on file  Physical Activity: Not on file  Stress: Not on file  Social Connections: Not on file  Intimate Partner Violence: Not on file    Past Medical History, Surgical history, Social history, and Family history were reviewed and updated as appropriate.   Please see review of systems for further details on the patient's review from today.   Objective:   Physical Exam:  There were no vitals taken for this visit.  Physical Exam Constitutional:      General:  He is not in acute distress.    Appearance: Normal appearance.  Neurological:     Mental Status: He is alert and oriented to person, place, and time.     Gait: Gait normal.  Psychiatric:        Attention and Perception: Attention and perception normal. He does not perceive auditory or visual hallucinations.        Mood and Affect: Mood and affect normal. Mood is not anxious or depressed. Affect is not labile.        Speech: Speech normal.  Behavior: Behavior normal. Behavior is cooperative.        Thought Content: Thought content normal.        Cognition and Memory: Cognition and memory normal.        Judgment: Judgment normal.     Lab Review:     Component Value Date/Time   NA 140 08/31/2020 0000   K 4.2 08/31/2020 0000   CL 103 08/31/2020 0000   CO2 28 08/31/2020 0000   GLUCOSE 129 (H) 08/31/2020 0000   BUN 20 08/31/2020 0000   CREATININE 1.10 08/31/2020 0000   CALCIUM 9.1 08/31/2020 0000   PROT 7.3 08/31/2020 0000   ALBUMIN 2.9 (L) 12/02/2019 0628   AST 14 08/31/2020 0000   ALT 11 08/31/2020 0000   ALKPHOS 76 12/02/2019 0628   BILITOT 0.3 08/31/2020 0000   GFRNONAA 80 08/31/2020 0000   GFRAA 93 08/31/2020 0000       Component Value Date/Time   WBC 8.5 03/29/2022 1823   WBC 7.7 08/31/2020 0000   RBC 5.74 03/29/2022 1823   RBC 5.66 08/31/2020 0000   HGB 15.8 03/29/2022 1823   HCT 48.8 03/29/2022 1823   PLT 138 (L) 08/31/2020 0000   MCV 85 03/29/2022 1823   MCH 27.5 03/29/2022 1823   MCH 28.3 08/31/2020 0000   MCHC 32.4 03/29/2022 1823   MCHC 33.8 08/31/2020 0000   RDW 12.7 03/29/2022 1823   LYMPHSABS 2.0 03/29/2022 1823   MONOABS 0.7 12/02/2019 0628   EOSABS 0.2 03/29/2022 1823   BASOSABS 0.1 03/29/2022 1823    No results found for: "POCLITH", "LITHIUM"   No results found for: "PHENYTOIN", "PHENOBARB", "VALPROATE", "CBMZ"   .res Assessment: Plan:    RECOMMENDATIONS:    Greater than 50% of 30 min telephonic visit with patient was spent on  counseling and coordination of care.  We discussed patients chronic health concerns and his continue wound care for feet. Reporting his anxiety and depression have improved some. Still having some marital problems. He is continuing in psychotherapy.   Discussed medication options, side effects and his goals for tx.  We agreed to: Continue  Cymbalta to 80 mg daily. Will take two 40 mg capsule at once.  Continue Trazodone 200 mg at bedtime Reduce Lamictal to 100 mg daily, patients request.  Will report any side effects or worsening symptoms To follow up in 8 weeks to reassess Provided emergency contact information Reviewed PDMP        Erik was seen today for depression, anxiety, follow-up, medication refill and patient education.  Diagnoses and all orders for this visit:  Major depressive disorder, recurrent episode, moderate (HCC) -     lamoTRIgine (LAMICTAL) 100 MG tablet; Take 1 tablet (100 mg total) by mouth daily. -     DULoxetine (CYMBALTA) 60 MG capsule; 1 capsule Orally Once a day for 90  Generalized anxiety disorder -     lamoTRIgine (LAMICTAL) 100 MG tablet; Take 1 tablet (100 mg total) by mouth daily. -     DULoxetine (CYMBALTA) 60 MG capsule; 1 capsule Orally Once a day for 90  Unspecified mood (affective) disorder (HCC) -     lamoTRIgine (LAMICTAL) 100 MG tablet; Take 1 tablet (100 mg total) by mouth daily. -     DULoxetine (CYMBALTA) 60 MG capsule; 1 capsule Orally Once a day for 90    Please see After Visit Summary for patient specific instructions.  Future Appointments  Date Time Provider [place]  09/16/2022 10:00 AM Fredirick Maudlin,  MD [facility]  09/21/2022 10:40 AM Bowen, Scot Jun, DO [facility] None  09/23/2022 10:00 AM [name], MD [facility]  10/11/2022 10:00 AM Alfonse Spruce, MD [facility] None  10/24/2022 10:20 AM Lovorn, Aundra Millet, MD [facility]    No orders of the defined types were placed in this encounter.      -------------------------------

## 2022-09-14 NOTE — Progress Notes (Signed)
Office: [phone]  /  Fax: [phone]   Initial Visit  [name] was seen in clinic today to evaluate for obesity. He is interested in losing weight to improve overall health and reduce the risk of weight related complications. He presents today to review program treatment options, initial physical assessment, and evaluation.     He was referred by: PCP  When asked what else they would like to accomplish? He states: Improve existing medical conditions, Reduce number of medications, Improve quality of life, and Lose a target amount of weight : 60 lbs   When asked how has your weight affected you? He states: Contributed to orthopedic problems or mobility issues, Having fatigue, and Problems with depression and or anxiety  Some associated conditions: OSA, Prediabetes, and Other: chronic pain syndrome.  He has been overweight since childhood.   Contributing factors: Family history, Stress, Reduced physical activity, and Other: Patient is using a wheelchair and 2 L of oxygen at night time since COVID.  Patient also has chronic dyspnea since COVID.  Weight promoting medications identified: Other: n/a  Current nutrition plan: None  Current level of physical activity: Step counting  Current or previous pharmacotherapy: GLP-1 and Other: Nausea and constipation.  Response to medication: Ineffective so it was discontinued and Had side effects so it was discontinued  Past medical history includes:   Past Medical History:  Diagnosis Date   Anginal pain ([name])    with covid   Anxiety    Asthma    Depression    Dyspnea    GERD (gastroesophageal reflux disease)    Headache    History of kidney stones    LEFT URETERAL STONE   HTN (hypertension)    Pancreatitis 2018   GALLBALDDER SLUDGE CAUSED ISSUED RESOLVED   Pneumonia 07/2019   covid   Prediabetes    SOBOE (shortness of breath on exertion)    Objective:   BP (!) 146/92   Pulse 63   Temp 97.9 F (36.6 C)   Ht 5' 8"$   (1.727 m)   Wt (!) 308 lb (139.7 kg)   SpO2 93%   BMI 46.83 kg/m  He was weighed on the bioimpedance scale: Body mass index is 46.83 kg/m.  Peak Weight:314 lbs.  General:  Alert, oriented and cooperative. Patient is in no acute distress.  Respiratory: Normal respiratory effort, no problems with respiration noted  Extremities: Normal range of motion.    Mental Status: Normal mood and affect. Normal behavior. Normal judgment and thought content.   Assessment and Plan:  1. COVID-19 long hauler manifesting chronic dyspnea Patient has had 1 on both feet since COVID.  Infection in 2020, was on a vent for 3 months.  Patient sees pulmonary and wound care.  Continue plan of care.  Discussed how his healthcare since COVID-19 has affected his weight and overall health.  2. Pre-diabetes Patient will bring in most recent labs from [facility].  Last A1c in chart was 5.7 in 2022.  Patient consumes excess carbs and sweets.  Plan to check A1c and fasting insulin labs.  3. Other depression with emotional eating Patient has been through a lot of medical problems for 3 years, which has affected her mood and has been stressed eating.  Patient is out of work for now but plans to go back in the future part-time.  Consider CBT as part of his treatment plan.  4. Obesity, current BMI 46.83 Continue use of a protein shake 1-2 times per day. Discussed  information about our program.  We reviewed weight, biometrics, associated medical conditions and contributing factors with patient. He would benefit from weight loss therapy via a modified calorie, low-carb, high-protein nutritional plan tailored to their REE (resting energy expenditure) which will be determined by indirect calorimetry.  We will also assess for cardiometabolic risk and nutritional derangements via fasting serologies at his next appointment.      Obesity Treatment / Action Plan:  Patient will work on garnering support from family and friends to  begin weight loss journey. Will work on eliminating or reducing the presence of highly palatable, calorie dense foods in the home. Will complete provided nutritional and psychosocial assessment questionnaire before the next appointment. Will be scheduled for indirect calorimetry to determine resting energy expenditure in a fasting state.  This will allow [place] to create a reduced calorie, high-protein meal plan to promote loss of fat mass while preserving muscle mass. Was counseled on nutritional approaches to weight loss and benefits of complex carbs and high quality protein as part of nutritional weight management. Was counseled on pharmacotherapy and role as an adjunct in weight management.   Obesity Education Performed Today:  He was weighed on the bioimpedance scale and results were discussed and documented in the synopsis.  We discussed obesity as a disease and the importance of a more detailed evaluation of all the factors contributing to the disease.  We discussed the importance of long term lifestyle changes which include nutrition, exercise and behavioral modifications as well as the importance of customizing this to his specific health and social needs.  We discussed the benefits of reaching a healthier weight to alleviate the symptoms of existing conditions and reduce the risks of the biomechanical, metabolic and psychological effects of obesity.  [name] appears to be in the action stage of change and states they are ready to start intensive lifestyle modifications and behavioral modifications.    Reviewed by clinician on day of visit: allergies, medications, problem list, medical history, surgical history, family history, social history, and previous encounter notes.  I have personally spent 40 minutes total time today in preparation, patient care, and documentation for this visit, including the following: review of clinical lab tests; review of medical  tests/procedures/services.    I, Davy Pique, am acting as Location manager for Loyal Gambler, DO.  I have reviewed the above documentation for accuracy and completeness, and I agree with the above. Dell Ponto, DO

## 2022-09-16 ENCOUNTER — Encounter (HOSPITAL_BASED_OUTPATIENT_CLINIC_OR_DEPARTMENT_OTHER): Payer: PPO | Admitting: General Surgery

## 2022-09-16 DIAGNOSIS — L89623 Pressure ulcer of left heel, stage 3: Secondary | ICD-10-CM | POA: Diagnosis not present

## 2022-09-16 DIAGNOSIS — L97522 Non-pressure chronic ulcer of other part of left foot with fat layer exposed: Secondary | ICD-10-CM | POA: Diagnosis not present

## 2022-09-17 NOTE — Progress Notes (Signed)
[name], [name] ([id number]) [id number] Page 1 of 15 Visit Report for 09/16/2022 Chief Complaint Document Details Patient Name: Date of Service: [name], [name] 09/16/2022 10:00 A M Medical Record Number: [id number] Patient Account Number: [id number] Date of Birth/Sex: Treating RN: [birth year] (47 y.o. M) Primary Care Provider: Dorinda Hill Other Clinician: Referring Provider: Treating Provider/Extender: Jana Hakim in Treatment: 113 Information Obtained from: Patient Chief Complaint Bilateral Plantar Foot Ulcers Electronic Signature(s) Signed: 09/16/2022 11:08:25 AM By: [name] MD FACS Entered By: [name] on 09/16/2022 11:08:25 -------------------------------------------------------------------------------- Debridement Details Patient Name: Date of Service: [name], TO [place] [name] 09/16/2022 10:00 A M Medical Record Number: [id number] Patient Account Number: [id number] Date of Birth/Sex: Treating RN: Jun 08, 1976 (47 y.o. [name], [name] Primary Care Provider: Dorinda Hill Other Clinician: Referring Provider: Treating Provider/Extender: Jana Hakim in Treatment: 113 Debridement Performed for Assessment: Wound #2 Left Calcaneus Performed By: Physician [name], MD Debridement Type: Debridement Level of Consciousness (Pre-procedure): Awake and Alert Pre-procedure Verification/Time Out Yes - 10:50 Taken: Start Time: 10:51 Pain Control: Lidocaine 4% T opical Solution T Area Debrided (L x W): otal 1.1 (cm) x 0.5 (cm) = 0.55 (cm) Tissue and other material debrided: Non-Viable, Callus, Slough, Slough Level: Non-Viable Tissue Debridement Description: Selective/Open Wound Instrument: Curette Bleeding: Minimum Hemostasis Achieved: Pressure Procedural Pain: 0 Post Procedural Pain: 0 Response to Treatment: Procedure was tolerated well Level of Consciousness (Post- Awake and  Alert procedure): Post Debridement Measurements of Total Wound Length: (cm) 1.1 Stage: Category/Stage III Width: (cm) 0.5 Depth: (cm) 0.1 Volume: (cm) 0.043 Character of Wound/Ulcer Post Debridement: Improved Post Procedure Diagnosis Same as [name], [name] ([id number]) [id number] Page 2 of 15 Notes Scribed for Dr. Lady Gary by Zenaida Deed, RN Electronic Signature(s) Signed: 09/16/2022 12:35:47 PM By: [name] MD FACS Signed: 09/16/2022 5:35:37 PM By: Zenaida Deed RN, BSN Entered By: Zenaida Deed on 09/16/2022 10:55:15 -------------------------------------------------------------------------------- HPI Details Patient Name: Date of Service: [name], TO [place] [name] 09/16/2022 10:00 A M Medical Record Number: [id number] Patient Account Number: [id number] Date of Birth/Sex: Treating RN: 30-Aug-1975 (46 y.o. M) Primary Care Provider: Dorinda Hill Other Clinician: Referring Provider: Treating Provider/Extender: Jana Hakim in Treatment: 113 History of Present Illness HPI Description: Wounds are12/03/2020 upon evaluation today patient presents for initial inspection here in our clinic concerning issues he has been having with the bottoms of his feet bilaterally. He states these actually occurred as wounds when he was hospitalized for 5 months secondary to Covid. He was apparently with tilting bed where he was in an upright position quite frequently and apparently this occurred in some way shape or form during that time. Fortunately there is no sign of active infection at this time. No fevers, chills, nausea, vomiting, or diarrhea. With that being said he still has substantial wounds on the plantar aspects of his feet Theragen require quite a bit of work to get these to heal. He has been using Santyl currently though that is been problematic both in receiving the medication as well as actually paid for it as it is become  quite expensive. Prior to the experience with Covid the patient really did not have any major medical problems other than hypertension he does have some mild generalized weakness following the Covid experience. 07/22/2020 on evaluation today patient appears to be doing okay in regard to his foot ulcers I feel like the wound beds are showing signs of better improvement that I do believe the  Iodoflex is helping in this regard. With that being said he does have a lot of drainage currently and this is somewhat blue/green in nature which is consistent with Pseudomonas. I do think a culture today would be appropriate for [place] to evaluate and see if that is indeed the case I would likely start him on antibiotic orally as well he is not allergic to Cipro knows of no issues he has had in the past 12/21; patient was admitted to the clinic earlier this month with bilateral presumed pressure ulcers on the bottom of his feet apparently related to excessive pressure from a tilt table arrangement in the intensive care unit. Patient relates this to being on ECMO but I am not really sure that is exactly related to that. I must say I have never seen anything like this. He has fairly extensive full-thickness wounds extending from his heel towards his midfoot mostly centered laterally. There is already been some healing distally. He does not appear to have an arterial issue. He has been using gentamicin to the wound surfaces with Iodoflex to help with ongoing debridement 1/6; this is a patient with pressure ulcers on the bottom of his feet related to excessive pressure from a standing position in the intensive care unit. He is complaining of a lot of pain in the right heel. He is not a diabetic. 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 We have been using Iodoflex to help prepare the surfaces of both wounds for an advanced treatment product. He is nonambulatory spending most of his time in a  wheelchair I have asked him not to propel the wheelchair with his heels 1/13; in general his wounds look better not much surface area change we have been using Iodoflex as of last week. I did an x-ray of the right heel as the patient was complaining of pain. I had some thoughts about a stress fracture perhaps Achilles tendon problems however what it showed was erosive changes along the inferior aspect of the calcaneus he now has a MRI booked for 1/20. 1/20; in general his wounds continue to be better. Some improvement in the large narrow areas proximally in his foot. He is still complaining of pain in the right heel and tenderness in certain areas of this wound. His MRI is tonight. I am not just looking for osteomyelitis that was brought up on the x-ray I am wondering about stress fractures, tendon ruptures etc. He has no such findings on the left. Also noteworthy is that the patient had critical illness neuropathy and some of the discomfort may be actual improvement in nerve function I am just not sure. These wounds were initially in the setting of severe critical illness related to COVID-19. He was put in a standing position. He may have also been on pressors at the point contributing to tissue ischemia. By his description at some point these wounds were grossly necrotic extending proximally up into the Achilles part of his heel. I do not know that I have ever really seen pictures of them like this although they may exist in epic We have ordered Tri layer Oasis. I am trying to stimulate some granulation in these areas. This is of course assuming the MRI is negative for infection 1/27; since the patient was last here he saw Dr. Juleen China of infectious disease. He is planned for vancomycin and ceftriaxone. Prior operative culture grew MSSA. Also ordered baseline lab work. He also ordered arterial studies although the ABIs in  our clinic were normal as well as his clinical exam these were normal I do not  think he needs to see vascular surgery. His ABIs at the [facility] were 1.22 in the right triphasic waveforms with a normal TBI of 1.15 on the left ABI of 1.22 with triphasic waveforms and a normal TBI of 1.08. Finally he saw Dr. Logan Bores who will follow him in for 2 months. At this point I do not think he felt that he needed a procedure on the right calcaneal bone. Dr. Earlene Plater is elected for broad-spectrum antibiotic The patient is still having pain in the right heel. He walks with a walker 2/3; wounds are generally smaller. He is tolerating his IV antibiotics. I believe this is vancomycin and ceftriaxone. We are still waiting for Oasis burn in terms of his out-of-pocket max which he should be meeting soon given the IV antibiotics, MRIs etc. I have asked him to check in on this. We are using silver collagen in the meantime the wounds look better 2/10; tolerating IV vancomycin and Rocephin. We are waiting to apply for Oasis. Although I am not really sure where he is in his out-of-pocket max. 2/17 started the first application of Oasis trilayer. Still on antibiotics. The wounds have generally look better. The area on the left has a little more surface slough requiring debridement [name], [name] ([id number]) [id number] Page 3 of 15 2/24; second application of Oasis trilayer. The wound surface granulation is generally look better. The area on the left with undermining laterally I think is come in a bit. 10/08/2020 upon evaluation today patient is here today for [employer] application #3. Fortunately he seems to be doing extremely well with regard to this and we are seeing a lot of new epithelial growth which is great news. Fortunately there is no signs of active infection at this time. 10/16/2020 upon evaluation today patient appears to be doing well with regard to his foot ulcers. Do believe the Oasis has been of benefit for him. I do not see any signs of infection right now which  is great news and I think that he has a lot of new epithelial growth which is great to see as well. The patient is very pleased to hear all of this. I do think we can proceed with the Oasis trilayer #4 today. 3/18; not as much improvement in these areas on his heels that I was hoping. I did reapply trilateral Oasis today the tissue looks healthier but not as much fill in as I was hoping. 3/25; better looking today I think this is come in a bit the tissue looks healthier. Triple layer Oasis reapplied #6 4/1; somewhat better looking definitely better looking surface not as much change in surface area as I was hoping. He may be spending more time [place] on days then he needs to although he does have heel offloading boots. Triple layer Oasis reapplied #7 4/7; unfortunately apparently [facility] will not approve any further Oasis which is unfortunate since the patient did respond nicely both in terms of the condition of the wound bed as well as surface area. There is still some drainage coming from the wound but not a lot there does not appear to be any infection 4/15; we have been using Hydrofera Blue. He continues to have nice rims of epithelialization on the right greater than the left. The left the epithelialization is coming from the tip of his heel. There is moderate drainage. In this that  concerns me about a total contact cast. There is no evidence of infection 4/29; patient has been using Hydrofera Blue with dressing changes. He has no complaints or issues today. 5/5; using Hydrofera Blue. I actually think that he looks marginally better than the last time I saw this 3 weeks ago. There are rims of epithelialization on the left thumb coming from the medial side on the right. Using Hydrofera Blue 5/12; using Hydrofera Blue. These continue to make improvements in surface area. His drainage was not listed as severe I therefore went ahead and put a cast on the left foot. Right foot we will  continue to dress his previous 5/16; back for first total contact cast change. He did not tolerate this particularly well cast injury on the anterior tibia among other issues. Difficulty sleeping. I talked him about this in some detail and afterwards is elected to continue. I told him I would like to have a cast on for 3 weeks to see if this is going to help at all. I think he agreed 5/19; I think the wound is better. There is no tunneling towards his midfoot. The undermining medially also looks better. He has a rim of new skin distally. I think we are making progress here. The area on the left also continues to look somewhat better to me using Hydrofera Blue. He has a list of complaints about the cast but none of them seem serious 5/26; patient presents for 1 week follow-up. He has been using a total contact cast and tolerating this well. Hydrofera Blue is the main dressing used. He denies signs of infection. 6/2 Hydrofera Blue total contact cast on the left. These were large ulcers that formed in intensive care unit where the patient was recovering from COVID. May have had something to do with being ventilated in an upright positiono Pressors etc. We have been able to get the areas down considerably and a viable surface. There is some epithelialization in both sides. Note made of drainage 6/9; changed to [name] last time because of drainage. He arrives with better looking surfaces and dimensions on the left than the right. Paradoxically the right actually probes more towards his midfoot the left is largely close down but both of these look improved. Using a total contact cast on the left 6/16; complex wounds on his bilateral plantar heels which were initially pressure injury from a stay in the ICU with COVID. We have been using silver alginate most recently. His dimensions of come in quite dramatically however not recently. We have been putting the left foot in a total contact cast 6/23;  complex wounds on the bilateral plantar heels. I been putting the left in the cast paradoxically the area on the right is the one that is going towards closure at a faster rate. Quite a bit of drainage on the left. The patient went to see Dr. Logan Bores who said he was going to standby for skin grafts. I had actually considered sending him for skin grafts however he would be mandatorily off his feet for a period of weeks to months. I am thinking that the area on the right is going to close on its own the area on the left has been more stubborn even though we have him in a total contact cast 6/30; took him out of a total contact cast last week is the right heel seem to be making better progress than the left where I was placing the cast. We are using  silver alginate. Both wounds are smaller right greater than left 7/12; both wounds look as though they are making some progress. We are using silver alginate. Heel offloading boots 7/26; very gradual progress especially on the right. Using silver alginate. He is wearing heel offloading boots 8/18; he continues to close these wounds down very gradually. Using silver alginate. The problem polymen being definitive about this is areas of what appears to be callus around the margins. This is not a 100% of the area but certainly sizable especially on the right 9/1; bilateral plantar feet wounds secondary to prolonged pressure while being ventilated for COVID-19 in an upright position. Essentially pressure ulcers on the bottom of his feet. He is made substantial progress using silver alginate. 9/14; bilateral plantar feet wounds secondary to prolonged pressure. Making progress using silver alginate. 9/29 bilateral plantar feet wounds secondary to prolonged pressure. I changed him to Iodoflex last week. MolecuLight showing reddened blush fluorescence 10/11; patient presents for follow-up. He has no issues or complaints today. He denies signs of infection. He continues  to use Iodoflex and antibiotic ointment to the wound beds. 10/27; 2-week follow-up. No evidence of infection. He has callus and thick dry skin around the wound margins we have been using Iodoflex and Bactroban which was in response to a moderate left MolecuLight reddish blush fluorescence. 11/10; 2-week follow-up. Wound margins again have thick callus however the measurements of the actual wound sites are a lot smaller. Everything looks reasonably healthy here. We have been using Iodoflex He was approved for prime matrix but I have elected to delay this given the improvement in the surface area. Hopefully I will not regret that decision as were getting close to the end of the year in terms of insurance payment 12/8; 2-week follow-up. Wounds are generally smaller in size. These were initially substantial wounds extending into the forefoot all the way into the heel on the bilateral plantar feet. They are now both located on the plantar heel distal aspect both of these have a lot of callus around the wounds I used a #5 curette to remove this on the right and the left also some subcutaneous debris to try and get the wound edges were using Iodoflex. He has heel offloading shoe 12/22; 2-week follow-up. Not really much improvement. He has thick callus around the outer edges of both wounds. I remove this there is some nonviable subcutaneous tissue as well. We have been using Iodoflex. Her intake nurse and myself spontaneously thought of a total contact cast I went back in May. At that time we really were not seeing much of an improvement with a cast although the wound was in a much different situation I would like to retry this in 2 weeks and I discussed this with the patient 08/12/2021; the patient has had some improvement with the Iodoflex. The the area on the left heel plantar more improved than the right. I had to put him in a total contact cast on the left although I decided to put that off for 2 weeks. I  am going to change his primary dressing to silver collagen. I think in both areas he has had some improvement most of the healing seems to be more proximal in the heel. The wounds are in the mid aspect. A lot of thick callus on the right heel however. 1/19; we are using silver collagen on both plantar heel areas. He has had some improvement today. The left did not require any debridement. He still  had some eschar on the right that was debrided but both seem to have contracted. I did not put it total contact cast on him today 2/2 we have been using silver collagen. The area on the right plantar heel has areas that appear to be epithelialized interspersed with dry flaking callus and dry skin. I removed this. This really looks better than on the other side. On the left still a large area with raised edges and debris on the surface. 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 He has no issues or complaints today. 2/9; not much change in the left foot wound with 1 week of a cast we are using silver collagen. Silver collagen on the right side. The right side has been the better wound surface. We will reapply the total contact cast on the left 2/16; not much improvement on either side I been using silver collagen with a total contact cast on the left. I'm changing the Hydrofera Blue still with a total contact cast on the left 2/23; some improvement on both sides. Disappointing that he has thick callus around the area that we are putting in a total contact cast on the left. [name], [name] ([id number]) [id number] Page 4 of 15 been using Hydrofera Blue on both wound areas. This is a man who at essentially pressure ulcers in addition to ischemia caused by medications to support his blood pressure (pressors) in the ICU. He was being ventilated in  the standing position for severe Covid. A Shiley the wounds extended across his entire foot but are now localized to his plantar heels bilaterally. We have made progress however neither areas healed. I continue to think the total contact cast is helped albeit painstakingly slowly. He has never wanted a plastic surgery consult although I don't know that they would be interested in grafting in area in this location. 10/07/2021: Continued improvement bilaterally. There is still some callus around the left wound, despite the total contact cast. He has some increased pain in his right midfoot around 1 particular area. This has been painful in the past but seems to be a little bit worse. When his cast was removed today, there was an area on the heel of the left foot that looks a bit macerated. He is also complaining of pain in his left thigh and hip which he thinks is secondary to the limb length discrepancy caused by the cast. 10/14/2021: He continues to improve. A little bit less callus around the left wound. He continues to endorse pain in his right midfoot, but this is not as significant as it was last week. The maceration on his left heel is improved. 10/21/2021: Continued improvement to both wounds. The maceration on his left heel is no longer evident. Less callus bilaterally. Epithelialization progressing. 10/28/2021: Significant improvement this week. The right sided wound is nearly closed with just a small open area at the middle. No maceration seen on the left heel. Continued epithelialization on both sides. No concern for infection. 11/04/2021: T oday, the wounds were measured a little bit differently and come out as larger, but I actually think they are about the same to potentially even smaller, particularly on the left. He continues to accumulate some callus on the right. 11/11/2021: T oday, the patient is expressing some concern that the left wound, despite being in the total contact cast,  is not  progressing at the same rate as the 1 on the right. He is interested in trying a week without the cast to see how the wound does. The wounds are roughly the same size as last week, with the right perhaps being a little bit smaller. He continues to build up callus on both sites. 11/18/2021: Last week, I permitted the patient to go without his total contact cast, just to see if the cast was really making any difference. Today, both wounds have deteriorated to some extent, suggesting that the cast is providing benefit, at least on the left. Both are larger and have accumulated callus, slough, and other debris. 11/26/2021: I debrided both wounds quite aggressively last week in an effort to stimulate the healing cascade. This appears to have been effective as the left sided wound is a full centimeter shorter in length. Although the right was measured slightly larger, on inspection, it looks as though an area of epithelialized tissue was included in the measurements. We have been using PolyMem Ag on the wound surfaces with a total contact cast to the left. 12/02/2021: It appears that the intake personnel are including epithelialized tissue in his wound measurements; the right wound is almost completely epithelialized; there is just a crater at the proximal midfoot with some open areas. On the left, he has built up some callus, but the overall wound surface looks good. There is some senescent skin around the wound margin. He has been in PolyMem Ag bilaterally with a total contact cast on the left. 12/09/2021: The right wound is nearly closed; there is just a small open area at the mid calcaneus. On the left, the wound is smaller with minimal callus buildup. No significant drainage. 12/16/2021: The right calcaneal wound remains minimally open at the mid calcaneus; the rest has epithelialized. On the left, the wound is also a little bit smaller. There is some senescent tissue on the periphery. He is getting his  first application of a trial skin substitute called Vendaje today. 12/23/2021: The wound on his right calcaneus is nearly closed; there is just a small area at the most distal aspect of the calcaneus that is open. On the left, the area where we applied to the skin substitute has a healthier-looking bed of granulation tissue. The wound dimensions are not significantly different on this side but the wound surface is improved. 12/30/2021: The wound on the right calcaneus has not changed significantly aside from some accumulation of callus. On the left, the open area is smaller and continues to have an improved surface. He continues to accumulate callus around the wound. He is here for his third application of Vendaje. 01/06/2022: The right calcaneal wound is down to just a couple of millimeters. He continues to accumulate periwound callus. He unfortunately got his cast wet earlier in the week and his left foot is macerated, resulting in some superficial skin loss just distal to the open wound. The open wound itself, however, is much smaller and has a healthier appearing surface. He is here for his fourth application of Vendaje. 01/13/2022: The right calcaneal wound is about the same. Unfortunately, once again, his cast got wet and his foot is again macerated. This is caused the left calcaneal wound to enlarge. He is here for his fifth application of Vendaje. 01/20/2022: The right calcaneal wound is very small. There is some periwound callus accumulation. He purchased a new cast protector last week and this has been effective in avoiding the maceration that  has been occurring on the left. The left calcaneal wound is narrower and has a healthy and viable-appearing surface. He is here for his 6 application of Vendaje. 01/27/2022: The right calcaneal wound is down to just a pinhole. There is some periwound slough and callus. On the left, the wound is narrower and shorter by about a centimeter. The surface is robust  and viable-appearing. Unfortunately, the rep for the trial skin substitute product did not provide any for [place] to use today. 02/04/2022: The right calcaneal wound remains unchanged. There is more accumulated callus. On the left, although the intake nurse measured it a little bit longer, it looks about the same to me. The surface has a layer of slough, but underneath this, there is good granulation tissue. 02/10/2022: The right calcaneus wound is nearly closed. There is still some callus that builds up around the site. The left side looks about the same in terms of dimensions, but the surface is more robust and vital-appearing. 02/16/2022: The area of the right calcaneus that was nearly closed last week has closed, but there is a small opening at the mid foot where it looks like some moisture got retained and caused some reopening. The left foot wound is narrower and shallower. Both sites have a fair amount of periwound callus and eschar. 02/24/2022: The small midfoot opening on the right calcaneus is a little bit smaller today. The left foot wound is narrower and shallower. He continues to accumulate periwound callus. No concern for infection. 03/01/2022: The patient came to clinic early because he showered and got his cast wet. Fortunately, there is no significant maceration to his foot but the callus softened and it looks like the wound on his left calcaneus may be a little bit wider. The wound on his right calcaneus is just a narrow slit. Continued accumulation of periwound callus bilaterally. 03/08/2022: The wound on his right calcaneus is very nearly closed, just a small pinpoint opening under a bit of eschar; the left wound has come in quite a bit, as well. It is narrower and shorter than at our last visit. Still with accumulated callus and eschar bilaterally. 03/17/2022: The right calcaneal wound is healed. The left wound is smaller and the surface itself is very clean, but there is some blue-green  staining on the periwound callus, concerning for Pseudomonas aeruginosa. 03/23/2022: The right calcaneal wound remains closed. The left wound continues to contract. No further blue-green staining. Small amount of callus and slough accumulation. CAGNEY, DEGRACE ([id number]) [id number] Page 5 of 15 03/28/2022: He came in early today because he had gotten his cast wet. On inspection, the wound itself did not get wet or macerated, just a little bit of the forefoot. The wound itself is basically unchanged. 04/07/2022: The right foot wound remains closed. The left wound is the smallest that I have seen it to date. It is narrower and shorter. It still continues to accumulate slough on the surface. 04/15/2022: There is a band of epithelium now dividing the small left plantar foot wound in 2. There is still some slough on the surface. 04/21/2022: The wound continues to narrow. Just a little bit of slough on the surface. He seems to be responding well to endoform. 04/28/2022: Continued slow contraction of the wound. There is a little slough on the surface and some periwound callus. We have been using endoform and total contact cast. 05/05/2022: The wound appears to have stalled. There is slough and some periwound eschar/callus. No concern for infection,  however. 05/12/2022: Unfortunately, his right foot has reopened. It is located at the most posterior aspect of his surgical incision. The area was noted to have drainage coming from it when his padding was removed today. Underneath some callus and senescent skin, there is an opening. No purulent drainage or malodor. On the left foot, the wound is again unchanged. There is some light blue staining on the callus, but no malodor or purulent drainage. 10/13; right and left heel remanence of extensive plantar foot wounds. These are better than I remember by quite a big margin however he is still left with wounds on the left plantar heel and the  right plantar heel. Been using endoform bilaterally. A culture was done that showed apparently Pseudomonas but we are still waiting for the [facility] antibiotic to use gentamicin today. He is still very active by description I am not sure about the offloading of his noncasted right foot 10/20; both wounds right and left heel debrided not much change from last week. [name] has arrived which is linezolid, gentamicin and ciprofloxacin we will use this with endoform. T contact cast on the left otal 06/02/2022: Both wounds are smaller today. There is still a fair amount of callus buildup around the right foot ulcer. The left is more superficial and nearly flush with the surrounding tissues. Also with slough and eschar buildup. 06/10/2022: The right sided wound appears to be nearly closed, if not completely so, although it is somewhat difficult to tell given the abnormal tissue and scarring in his foot. There is a fair amount of callus and crust accumulation. On the left, the wound looks about the same, again with callus and slough. He has an appointment next Thursday with Dr. Annamary RummageStandiford in podiatry; I am hopeful that there may be some reconstructive options available for Mr. Nelda MarseilleGourley. 06/16/2022: Both wounds have some eschar and callus accumulation. The right sided wound is extremely narrow and barely open; the left is narrower than last week. There is a little bit of slough. He has his appointment in podiatry later today. 06/23/2022: The patient met with Dr. Annamary RummageStandiford last week and unfortunately, there are no reconstructive options that he believes would be helpful. He did order an MRI to evaluate for osteomyelitis and fortunately, none was seen. The left sided wound is a little bit shorter and narrower today. The right sided wound is about the same. There is callus and eschar accumulation bilaterally. 06/29/2022: Both feet have improved from last week. There is epithelium making a valiant effort to creep  across the surface on the left. The right side looks like it got a little dry and the deep crevasse in his midfoot has cracked. Both have eschar and there is some slough on the left. 07/07/2022: Both feet have improved. There is epithelium completely covering the calcaneus on the right with just a small opening in the crevasse in his midfoot. On the left, the open area of tissue is smaller but he continues to build up callus/eschar and slough. 07/15/2022: The opening in the midfoot on the right is about the same size, covered with eschar and a little bit of slough. The open portion of the left wound is narrower and shorter with a bit of slough buildup. He admits to being on his feet more than recommended. 12/14; as far as I can tell everything on the right foot is closed. There is some eschar I removed some of this I cannot identify any open wound here. As usual this will be  a very vulnerable area going forward. On the left this looks really quite healthy. I was pleasantly surprised to see how good this looked. Wound is certainly smaller and there appears to be healthy epithelialization. He has been using Promogran on the right and endoform on the left. He has been offloading the right foot with a heel offloading boot and he has a running shoe on the right foot 08/04/2022: The right foot remains closed. He has a thick cushioned insole in his sneaker. The left sided wound is smaller with just some slough and eschar accumulation. He is wearing the heel off loader on this foot. 08/15/2022: The right foot remains closed. The left wound has narrowed further. There is some slough and eschar accumulation. 08/25/2022: We put him in a peg assist shoe insert and as a result, he has more epithelialization of the ulcer with minimal slough and eschar accumulation. 09/01/2022: The wound is smaller by about half this week. Still with some slough on the surface. The peg assist shoe seems to be doing a remarkable job  of adequately offloading the site. 09/08/2022: There is a little bit more epithelium coming in. There is some slough and callus buildup. 09/16/2022: The wound measures about the same size, but the epithelium that has grown in looks more robust and stronger. There is some slough and callus buildup. Electronic Signature(s) Signed: 09/16/2022 11:08:54 AM By: Duanne Guessannon, [name] Zettlemoyer MD FACS Entered By: Duanne Guessannon, [name] on 09/16/2022 11:08:54 -------------------------------------------------------------------------------- Physical Exam Details Patient Name: Date of Service: [name] BornGO URLEY, TO [place] [name] 09/16/2022 10:00 A M Medical Record Number: [id number] Patient Account Number: [id number] Date of Birth/Sex: Treating RN: [birth year] (47 y.o. Willey BladeM) [name], [name] ([id number]) [id number] Page 6 of 15 Primary Care Provider: Dorinda HillWile, Laura Other Clinician: Referring Provider: Treating Provider/Extender: Jana Hakimannon, Tanicia Wolaver Wile, Laura Weeks in Treatment: 113 Constitutional Slightly hypertensive. . . . no acute distress. Respiratory Normal work of breathing on room air. Notes 09/16/2022: The wound measures about the same size, but the epithelium that has grown in looks more robust and stronger. There is some slough and callus buildup. Electronic Signature(s) Signed: 09/16/2022 11:09:23 AM By: Duanne Guessannon, Emiline Mancebo MD FACS Entered By: Duanne Guessannon, [place] Hafen on 09/16/2022 11:09:23 -------------------------------------------------------------------------------- Physician Orders Details Patient Name: Date of Service: [name] BornGO URLEY, TO [place] [name] 09/16/2022 10:00 A M Medical Record Number: [id number] Patient Account Number: [id number] Date of Birth/Sex: Treating RN: [birth year] (47 y.o. [name] SchoonerM) Boehlein, Linda Primary Care Provider: Dorinda HillWile, Laura Other Clinician: Referring Provider: Treating Provider/Extender: Jana Hakimannon, Vitor Overbaugh Wile, Laura Weeks in Treatment: [phone] Verbal / Phone Orders: No Diagnosis Coding ICD-10 Coding Code  Description [phone] Non-pressure chronic ulcer of other part of left foot with fat layer exposed Follow-up Appointments ppointment in 1 week. - Dr. Lady Garyannon Room 2 Return A Friday 2/16 @ 10:00 am Anesthetic Wound #2 Left Calcaneus (In clinic) Topical Lidocaine 4% applied to wound bed - In clinic Bathing/ Shower/ Hygiene May shower and wash wound with soap and water. Edema Control - Lymphedema / SCD / Other Bilateral Lower Extremities Avoid standing for long periods of time. Moisturize legs daily. - as needed Off-Loading Other: - keep pressure off of the bottom of your feet. Elevate legs throughout the day. Use the Shoe with the PegAssist off-loading insole Additional Orders / Instructions Follow Nutritious Diet - Try to get 70-100 grams of Protein a day+ Wound Treatment Wound #2 - Calcaneus Wound Laterality: Left Cleanser: Normal Saline (Generic) Every Other Day/30 Days Discharge Instructions: Cleanse the wound with Normal Saline prior to applying a  clean dressing using gauze sponges, not tissue or cotton balls. Cleanser: Wound Cleanser (Generic) Every Other Day/30 Days Discharge Instructions: Cleanse the wound with wound cleanser prior to applying a clean dressing using gauze sponges, not tissue or cotton balls. Prim Dressing: Endoform 2x2 in (Generic) Every Other Day/30 Days BERND, CROM ([id number]) [id number] Page 7 of 15 Discharge Instructions: Moisten with saline or Hydrogel Secondary Dressing: Optifoam Non-Adhesive Dressing, 4x4 in (Generic) Every Other Day/30 Days Discharge Instructions: Apply over primary dressing as directed. Secondary Dressing: Woven Gauze Sponge, Non-Sterile 4x4 in (Generic) Every Other Day/30 Days Discharge Instructions: Apply over primary dressing as directed. Secured With: 71M Medipore H Soft Cloth Surgical T ape, 4 x 10 (in/yd) (Generic) Every Other Day/30 Days Discharge Instructions: Secure with tape as  directed. Compression Wrap: Kerlix Roll 4.5x3.1 (in/yd) Every Other Day/30 Days Discharge Instructions: Apply Kerlix and Coban compression as directed. Electronic Signature(s) Signed: 09/16/2022 12:35:47 PM By: [name] MD FACS Entered By: [name] on 09/16/2022 11:09:40 -------------------------------------------------------------------------------- Problem List Details Patient Name: Date of Service: [name], TO [place] [name] 09/16/2022 10:00 A M Medical Record Number: [id number] Patient Account Number: [id number] Date of Birth/Sex: Treating RN: 05-06-1976 (47 y.o. [name] Primary Care Provider: Dorinda Hill Other Clinician: Referring Provider: Treating Provider/Extender: Jana Hakim in Treatment: 113 Active Problems ICD-10 Encounter Code Description Active Date MDM Diagnosis L97.522 Non-pressure chronic ulcer of other part of left foot with fat layer exposed 09/03/2020 No Yes Inactive Problems ICD-10 Code Description Active Date Inactive Date L97.512 Non-pressure chronic ulcer of other part of right foot with fat layer exposed 09/03/2020 09/03/2020 L89.893 Pressure ulcer of other site, stage 3 07/15/2020 07/15/2020 M62.81 Muscle weakness (generalized) 07/15/2020 07/15/2020 I10 Essential (primary) hypertension 07/15/2020 07/15/2020 M86.171 Other acute osteomyelitis, right ankle and foot 09/03/2020 09/03/2020 Resolved Problems Electronic Signature(s) Signed: 09/16/2022 11:08:10 AM By: [name] MD FACS Entered By: [name] on 09/16/2022 11:08:10 [name] ([id number]) [id number] Page 8 of 15 -------------------------------------------------------------------------------- Progress Note Details Patient Name: Date of Service: [name], [name] 09/16/2022 10:00 A M Medical Record Number: [id number] Patient Account Number: [id number] Date of Birth/Sex: Treating RN: Jul 18, 1976 (47 y.o. M) Primary Care Provider:  Dorinda Hill Other Clinician: Referring Provider: Treating Provider/Extender: Jana Hakim in Treatment: 113 Subjective Chief Complaint Information obtained from Patient Bilateral Plantar Foot Ulcers History of Present Illness (HPI) Wounds are12/03/2020 upon evaluation today patient presents for initial inspection here in our clinic concerning issues he has been having with the bottoms of his feet bilaterally. He states these actually occurred as wounds when he was hospitalized for 5 months secondary to Covid. He was apparently with tilting bed where he was in an upright position quite frequently and apparently this occurred in some way shape or form during that time. Fortunately there is no sign of active infection at this time. No fevers, chills, nausea, vomiting, or diarrhea. With that being said he still has substantial wounds on the plantar aspects of his feet Theragen require quite a bit of work to get these to heal. He has been using Santyl currently though that is been problematic both in receiving the medication as well as actually paid for it as it is become quite expensive. Prior to the experience with Covid the patient really did not have any major medical problems other than hypertension he does have some mild generalized weakness following the Covid experience. 07/22/2020 on evaluation today patient appears to be doing okay in  regard to his foot ulcers I feel like the wound beds are showing signs of better improvement that I do believe the Iodoflex is helping in this regard. With that being said he does have a lot of drainage currently and this is somewhat blue/green in nature which is consistent with Pseudomonas. I do think a culture today would be appropriate for [place] to evaluate and see if that is indeed the case I would likely start him on antibiotic orally as well he is not allergic to Cipro knows of no issues he has had in the past 12/21; patient was  admitted to the clinic earlier this month with bilateral presumed pressure ulcers on the bottom of his feet apparently related to excessive pressure from a tilt table arrangement in the intensive care unit. Patient relates this to being on ECMO but I am not really sure that is exactly related to that. I must say I have never seen anything like this. He has fairly extensive full-thickness wounds extending from his heel towards his midfoot mostly centered laterally. There is already been some healing distally. He does not appear to have an arterial issue. He has been using gentamicin to the wound surfaces with Iodoflex to help with ongoing debridement 1/6; this is a patient with pressure ulcers on the bottom of his feet related to excessive pressure from a standing position in the intensive care unit. He is complaining of a lot of pain in the right heel. He is not a diabetic. 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 We have been using Iodoflex to help prepare the surfaces of both wounds for an advanced treatment product. He is nonambulatory spending most of his time in a wheelchair I have asked him not to propel the wheelchair with his heels 1/13; in general his wounds look better not much surface area change we have been using Iodoflex as of last week. I did an x-ray of the right heel as the patient was complaining of pain. I had some thoughts about a stress fracture perhaps Achilles tendon problems however what it showed was erosive changes along the inferior aspect of the calcaneus he now has a MRI booked for 1/20. 1/20; in general his wounds continue to be better. Some improvement in the large narrow areas proximally in his foot. He is still complaining of pain in the right heel and tenderness in certain areas of this wound. His MRI is tonight. I am not just looking for osteomyelitis that was brought up on the x-ray I am wondering about stress fractures, tendon ruptures etc.  He has no such findings on the left. Also noteworthy is that the patient had critical illness neuropathy and some of the discomfort may be actual improvement in nerve function I am just not sure. These wounds were initially in the setting of severe critical illness related to COVID-19. He was put in a standing position. He may have also been on pressors at the point contributing to tissue ischemia. By his description at some point these wounds were grossly necrotic extending proximally up into the Achilles part of his heel. I do not know that I have ever really seen pictures of them like this although they may exist in epic We have ordered Tri layer Oasis. I am trying to stimulate some granulation in these areas. This is of course assuming the MRI is negative for infection 1/27; since the patient was last here he saw Dr. Earlene Plater of infectious disease. He is planned  for vancomycin and ceftriaxone. Prior operative culture grew MSSA. Also ordered baseline lab work. He also ordered arterial studies although the ABIs in our clinic were normal as well as his clinical exam these were normal I do not think he needs to see vascular surgery. His ABIs at the [facility] were 1.22 in the right triphasic waveforms with a normal TBI of 1.15 on the left ABI of 1.22 with triphasic waveforms and a normal TBI of 1.08. Finally he saw Dr. Logan Bores who will follow him in for 2 months. At this point I do not think he felt that he needed a procedure on the right calcaneal bone. Dr. Earlene Plater is elected for broad-spectrum antibiotic The patient is still having pain in the right heel. He walks with a walker 2/3; wounds are generally smaller. He is tolerating his IV antibiotics. I believe this is vancomycin and ceftriaxone. We are still waiting for Oasis burn in terms of his out-of-pocket max which he should be meeting soon given the IV antibiotics, MRIs etc. I have asked him to check in on this. We are using silver collagen in the  meantime the wounds look better 2/10; tolerating IV vancomycin and Rocephin. We are waiting to apply for Oasis. Although I am not really sure where he is in his out-of-pocket max. 2/17 started the first application of Oasis trilayer. Still on antibiotics. The wounds have generally look better. The area on the left has a little more surface slough requiring debridement 2/24; second application of Oasis trilayer. The wound surface granulation is generally look better. The area on the left with undermining laterally I think is come in a bit. 10/08/2020 upon evaluation today patient is here today for [employer] application #3. Fortunately he seems to be doing extremely well with regard to this and we are seeing a lot of new epithelial growth which is great news. Fortunately there is no signs of active infection at this time. 10/16/2020 upon evaluation today patient appears to be doing well with regard to his foot ulcers. Do believe the Oasis has been of benefit for him. I do not see any signs of infection right now which is great news and I think that he has a lot of new epithelial growth which is great to see as well. The patient is very [name], [name] ([id number]) [id number] Page 9 of 15 pleased to hear all of this. I do think we can proceed with the Oasis trilayer #4 today. 3/18; not as much improvement in these areas on his heels that I was hoping. I did reapply trilateral Oasis today the tissue looks healthier but not as much fill in as I was hoping. 3/25; better looking today I think this is come in a bit the tissue looks healthier. Triple layer Oasis reapplied #6 4/1; somewhat better looking definitely better looking surface not as much change in surface area as I was hoping. He may be spending more time [place] on days then he needs to although he does have heel offloading boots. Triple layer Oasis reapplied #7 4/7; unfortunately apparently [facility] will  not approve any further Oasis which is unfortunate since the patient did respond nicely both in terms of the condition of the wound bed as well as surface area. There is still some drainage coming from the wound but not a lot there does not appear to be any infection 4/15; we have been using Hydrofera Blue. He continues to have nice rims of epithelialization on the right  greater than the left. The left the epithelialization is coming from the tip of his heel. There is moderate drainage. In this that concerns me about a total contact cast. There is no evidence of infection 4/29; patient has been using Hydrofera Blue with dressing changes. He has no complaints or issues today. 5/5; using Hydrofera Blue. I actually think that he looks marginally better than the last time I saw this 3 weeks ago. There are rims of epithelialization on the left thumb coming from the medial side on the right. Using Hydrofera Blue 5/12; using Hydrofera Blue. These continue to make improvements in surface area. His drainage was not listed as severe I therefore went ahead and put a cast on the left foot. Right foot we will continue to dress his previous 5/16; back for first total contact cast change. He did not tolerate this particularly well cast injury on the anterior tibia among other issues. Difficulty sleeping. I talked him about this in some detail and afterwards is elected to continue. I told him I would like to have a cast on for 3 weeks to see if this is going to help at all. I think he agreed 5/19; I think the wound is better. There is no tunneling towards his midfoot. The undermining medially also looks better. He has a rim of new skin distally. I think we are making progress here. The area on the left also continues to look somewhat better to me using Hydrofera Blue. He has a list of complaints about the cast but none of them seem serious 5/26; patient presents for 1 week follow-up. He has been using a total  contact cast and tolerating this well. Hydrofera Blue is the main dressing used. He denies signs of infection. 6/2 Hydrofera Blue total contact cast on the left. These were large ulcers that formed in intensive care unit where the patient was recovering from COVID. May have had something to do with being ventilated in an upright positiono Pressors etc. We have been able to get the areas down considerably and a viable surface. There is some epithelialization in both sides. Note made of drainage 6/9; changed to [facility] [address] [facility] last time because of drainage. He arrives with better looking surfaces and dimensions on the left than the right. Paradoxically the right actually probes more towards his midfoot the left is largely close down but both of these look improved. Using a total contact cast on the left 6/16; complex wounds on his bilateral plantar heels which were initially pressure injury from a stay in the ICU with COVID. We have been using silver alginate most recently. His dimensions of come in quite dramatically however not recently. We have been putting the left foot in a total contact cast 6/23; complex wounds on the bilateral plantar heels. I been putting the left in the cast paradoxically the area on the right is the one that is going towards closure at a faster rate. Quite a bit of drainage on the left. The patient went to see Dr. Logan Bores who said he was going to standby for skin grafts. I had actually considered sending him for skin grafts however he would be mandatorily off his feet for a period of weeks to months. I am thinking that the area on the right is going to close on its own the area on the left has been more stubborn even though we have him in a total contact cast 6/30; took him out of a total contact cast last week  is the right heel seem to be making better progress than the left where I was placing the cast. We are using silver alginate. Both wounds are smaller right greater than  left 7/12; both wounds look as though they are making some progress. We are using silver alginate. Heel offloading boots 7/26; very gradual progress especially on the right. Using silver alginate. He is wearing heel offloading boots 8/18; he continues to close these wounds down very gradually. Using silver alginate. The problem polymen being definitive about this is areas of what appears to be callus around the margins. This is not a 100% of the area but certainly sizable especially on the right 9/1; bilateral plantar feet wounds secondary to prolonged pressure while being ventilated for COVID-19 in an upright position. Essentially pressure ulcers on the bottom of his feet. He is made substantial progress using silver alginate. 9/14; bilateral plantar feet wounds secondary to prolonged pressure. Making progress using silver alginate. 9/29 bilateral plantar feet wounds secondary to prolonged pressure. I changed him to Iodoflex last week. MolecuLight showing reddened blush fluorescence 10/11; patient presents for follow-up. He has no issues or complaints today. He denies signs of infection. He continues to use Iodoflex and antibiotic ointment to the wound beds. 10/27; 2-week follow-up. No evidence of infection. He has callus and thick dry skin around the wound margins we have been using Iodoflex and Bactroban which was in response to a moderate left MolecuLight reddish blush fluorescence. 11/10; 2-week follow-up. Wound margins again have thick callus however the measurements of the actual wound sites are a lot smaller. Everything looks reasonably healthy here. We have been using Iodoflex He was approved for prime matrix but I have elected to delay this given the improvement in the surface area. Hopefully I will not regret that decision as were getting close to the end of the year in terms of insurance payment 12/8; 2-week follow-up. Wounds are generally smaller in size. These were initially  substantial wounds extending into the forefoot all the way into the heel on the bilateral plantar feet. They are now both located on the plantar heel distal aspect both of these have a lot of callus around the wounds I used a #5 curette to remove this on the right and the left also some subcutaneous debris to try and get the wound edges were using Iodoflex. He has heel offloading shoe 12/22; 2-week follow-up. Not really much improvement. He has thick callus around the outer edges of both wounds. I remove this there is some nonviable subcutaneous tissue as well. We have been using Iodoflex. Her intake nurse and myself spontaneously thought of a total contact cast I went back in May. At that time we really were not seeing much of an improvement with a cast although the wound was in a much different situation I would like to retry this in 2 weeks and I discussed this with the patient 08/12/2021; the patient has had some improvement with the Iodoflex. The the area on the left heel plantar more improved than the right. I had to put him in a total contact cast on the left although I decided to put that off for 2 weeks. I am going to change his primary dressing to silver collagen. I think in both areas he has had some improvement most of the healing seems to be more proximal in the heel. The wounds are in the mid aspect. A lot of thick callus on the right heel however. 1/19; we are using  silver collagen on both plantar heel areas. He has had some improvement today. The left did not require any debridement. He still had some eschar on the right that was debrided but both seem to have contracted. I did not put it total contact cast on him today 2/2 we have been using silver collagen. The area on the right plantar heel has areas that appear to be epithelialized interspersed with dry flaking callus and dry skin. I removed this. This really looks better than on the other side. On the left still a large area with  raised edges and debris on the surface. 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 He has no issues or complaints today. 2/9; not much change in the left foot wound with 1 week of a cast we are using silver collagen. Silver collagen on the right side. The right side has been the better wound surface. We will reapply the total contact cast on the left 2/16; not much improvement on either side I been using silver collagen with a total contact cast on the left. I'm changing the Hydrofera Blue still with a total contact cast on the left 2/23; some improvement on both sides. Disappointing that he has thick callus around the area that we are putting in a total contact cast on the left. We've been using Hydrofera Blue on both wound areas. This is a man who at essentially pressure ulcers in addition to ischemia caused by medications to support his blood pressure (pressors) in the ICU. He was being ventilated in the standing position for severe Covid. A Shiley the wounds extended across his entire foot but are now localized to his plantar heels bilaterally. We have made progress however neither areas healed. I continue to think the total contact cast is helped albeit painstakingly slowly. He has never wanted a plastic surgery consult although I don't know that they would be interested in grafting in area in this location. 10/07/2021: Continued improvement bilaterally. There is still some callus around the left wound, despite the total contact cast. He has some increased pain in his [name], [name] ([id number]) [id number] Page 10 of 15 right midfoot around 1 particular area. This has been painful in the past but seems to be a little bit worse. When his cast was removed today, there was an area on the heel of the left foot that looks a bit macerated. He is  also complaining of pain in his left thigh and hip which he thinks is secondary to the limb length discrepancy caused by the cast. 10/14/2021: He continues to improve. A little bit less callus around the left wound. He continues to endorse pain in his right midfoot, but this is not as significant as it was last week. The maceration on his left heel is improved. 10/21/2021: Continued improvement to both wounds. The maceration on his left heel is no longer evident. Less callus bilaterally. Epithelialization progressing. 10/28/2021: Significant improvement this week. The right sided wound is nearly closed with just a small open area at the middle. No maceration seen on the left heel. Continued epithelialization on both sides. No concern for infection. 11/04/2021: T oday, the wounds were measured a little bit differently and come out as larger, but I actually think they are about the same to potentially even smaller, particularly on the left. He continues to accumulate some callus  on the right. 11/11/2021: T oday, the patient is expressing some concern that the left wound, despite being in the total contact cast, is not progressing at the same rate as the 1 on the right. He is interested in trying a week without the cast to see how the wound does. The wounds are roughly the same size as last week, with the right perhaps being a little bit smaller. He continues to build up callus on both sites. 11/18/2021: Last week, I permitted the patient to go without his total contact cast, just to see if the cast was really making any difference. Today, both wounds have deteriorated to some extent, suggesting that the cast is providing benefit, at least on the left. Both are larger and have accumulated callus, slough, and other debris. 11/26/2021: I debrided both wounds quite aggressively last week in an effort to stimulate the healing cascade. This appears to have been effective as the left sided wound is a full centimeter  shorter in length. Although the right was measured slightly larger, on inspection, it looks as though an area of epithelialized tissue was included in the measurements. We have been using PolyMem Ag on the wound surfaces with a total contact cast to the left. 12/02/2021: It appears that the intake personnel are including epithelialized tissue in his wound measurements; the right wound is almost completely epithelialized; there is just a crater at the proximal midfoot with some open areas. On the left, he has built up some callus, but the overall wound surface looks good. There is some senescent skin around the wound margin. He has been in PolyMem Ag bilaterally with a total contact cast on the left. 12/09/2021: The right wound is nearly closed; there is just a small open area at the mid calcaneus. On the left, the wound is smaller with minimal callus buildup. No significant drainage. 12/16/2021: The right calcaneal wound remains minimally open at the mid calcaneus; the rest has epithelialized. On the left, the wound is also a little bit smaller. There is some senescent tissue on the periphery. He is getting his first application of a trial skin substitute called Vendaje today. 12/23/2021: The wound on his right calcaneus is nearly closed; there is just a small area at the most distal aspect of the calcaneus that is open. On the left, the area where we applied to the skin substitute has a healthier-looking bed of granulation tissue. The wound dimensions are not significantly different on this side but the wound surface is improved. 12/30/2021: The wound on the right calcaneus has not changed significantly aside from some accumulation of callus. On the left, the open area is smaller and continues to have an improved surface. He continues to accumulate callus around the wound. He is here for his third application of Vendaje. 01/06/2022: The right calcaneal wound is down to just a couple of millimeters. He  continues to accumulate periwound callus. He unfortunately got his cast wet earlier in the week and his left foot is macerated, resulting in some superficial skin loss just distal to the open wound. The open wound itself, however, is much smaller and has a healthier appearing surface. He is here for his fourth application of Vendaje. 01/13/2022: The right calcaneal wound is about the same. Unfortunately, once again, his cast got wet and his foot is again macerated. This is caused the left calcaneal wound to enlarge. He is here for his fifth application of Vendaje. 01/20/2022: The right calcaneal wound is very small. There  is some periwound callus accumulation. He purchased a new cast protector last week and this has been effective in avoiding the maceration that has been occurring on the left. The left calcaneal wound is narrower and has a healthy and viable-appearing surface. He is here for his 6 application of Vendaje. 01/27/2022: The right calcaneal wound is down to just a pinhole. There is some periwound slough and callus. On the left, the wound is narrower and shorter by about a centimeter. The surface is robust and viable-appearing. Unfortunately, the rep for the trial skin substitute product did not provide any for [place] to use today. 02/04/2022: The right calcaneal wound remains unchanged. There is more accumulated callus. On the left, although the intake nurse measured it a little bit longer, it looks about the same to me. The surface has a layer of slough, but underneath this, there is good granulation tissue. 02/10/2022: The right calcaneus wound is nearly closed. There is still some callus that builds up around the site. The left side looks about the same in terms of dimensions, but the surface is more robust and vital-appearing. 02/16/2022: The area of the right calcaneus that was nearly closed last week has closed, but there is a small opening at the mid foot where it looks like some moisture got  retained and caused some reopening. The left foot wound is narrower and shallower. Both sites have a fair amount of periwound callus and eschar. 02/24/2022: The small midfoot opening on the right calcaneus is a little bit smaller today. The left foot wound is narrower and shallower. He continues to accumulate periwound callus. No concern for infection. 03/01/2022: The patient came to clinic early because he showered and got his cast wet. Fortunately, there is no significant maceration to his foot but the callus softened and it looks like the wound on his left calcaneus may be a little bit wider. The wound on his right calcaneus is just a narrow slit. Continued accumulation of periwound callus bilaterally. 03/08/2022: The wound on his right calcaneus is very nearly closed, just a small pinpoint opening under a bit of eschar; the left wound has come in quite a bit, as well. It is narrower and shorter than at our last visit. Still with accumulated callus and eschar bilaterally. 03/17/2022: The right calcaneal wound is healed. The left wound is smaller and the surface itself is very clean, but there is some blue-green staining on the periwound callus, concerning for Pseudomonas aeruginosa. 03/23/2022: The right calcaneal wound remains closed. The left wound continues to contract. No further blue-green staining. Small amount of callus and slough accumulation. 03/28/2022: He came in early today because he had gotten his cast wet. On inspection, the wound itself did not get wet or macerated, just a little bit of the forefoot. The wound itself is basically unchanged. 04/07/2022: The right foot wound remains closed. The left wound is the smallest that I have seen it to date. It is narrower and shorter. It still continues to accumulate slough on the surface. 04/15/2022: There is a band of epithelium now dividing the small left plantar foot wound in 2. There is still some slough on the surface. [name], [name]  ([id number]) [id number] Page 11 of 15 04/21/2022: The wound continues to narrow. Just a little bit of slough on the surface. He seems to be responding well to endoform. 04/28/2022: Continued slow contraction of the wound. There is a little slough on the surface and some periwound callus. We have been using  endoform and total contact cast. 05/05/2022: The wound appears to have stalled. There is slough and some periwound eschar/callus. No concern for infection, however. 05/12/2022: Unfortunately, his right foot has reopened. It is located at the most posterior aspect of his surgical incision. The area was noted to have drainage coming from it when his padding was removed today. Underneath some callus and senescent skin, there is an opening. No purulent drainage or malodor. On the left foot, the wound is again unchanged. There is some light blue staining on the callus, but no malodor or purulent drainage. 10/13; right and left heel remanence of extensive plantar foot wounds. These are better than I remember by quite a big margin however he is still left with wounds on the left plantar heel and the right plantar heel. Been using endoform bilaterally. A culture was done that showed apparently Pseudomonas but we are still waiting for the [facility] antibiotic to use gentamicin today. He is still very active by description I am not sure about the offloading of his noncasted right foot 10/20; both wounds right and left heel debrided not much change from last week. [name] Echevaria has arrived which is linezolid, gentamicin and ciprofloxacin we will use this with endoform. T contact cast on the left otal 06/02/2022: Both wounds are smaller today. There is still a fair amount of callus buildup around the right foot ulcer. The left is more superficial and nearly flush with the surrounding tissues. Also with slough and eschar buildup. 06/10/2022: The right sided wound appears to be nearly closed,  if not completely so, although it is somewhat difficult to tell given the abnormal tissue and scarring in his foot. There is a fair amount of callus and crust accumulation. On the left, the wound looks about the same, again with callus and slough. He has an appointment next Thursday with Dr. Annamary Rummage in podiatry; I am hopeful that there may be some reconstructive options available for Mr. Beever. 06/16/2022: Both wounds have some eschar and callus accumulation. The right sided wound is extremely narrow and barely open; the left is narrower than last week. There is a little bit of slough. He has his appointment in podiatry later today. 06/23/2022: The patient met with Dr. Annamary Rummage last week and unfortunately, there are no reconstructive options that he believes would be helpful. He did order an MRI to evaluate for osteomyelitis and fortunately, none was seen. The left sided wound is a little bit shorter and narrower today. The right sided wound is about the same. There is callus and eschar accumulation bilaterally. 06/29/2022: Both feet have improved from last week. There is epithelium making a valiant effort to creep across the surface on the left. The right side looks like it got a little dry and the deep crevasse in his midfoot has cracked. Both have eschar and there is some slough on the left. 07/07/2022: Both feet have improved. There is epithelium completely covering the calcaneus on the right with just a small opening in the crevasse in his midfoot. On the left, the open area of tissue is smaller but he continues to build up callus/eschar and slough. 07/15/2022: The opening in the midfoot on the right is about the same size, covered with eschar and a little bit of slough. The open portion of the left wound is narrower and shorter with a bit of slough buildup. He admits to being on his feet more than recommended. 12/14; as far as I can tell everything on the right foot is  closed. There is some  eschar I removed some of this I cannot identify any open wound here. As usual this will be a very vulnerable area going forward. On the left this looks really quite healthy. I was pleasantly surprised to see how good this looked. Wound is certainly smaller and there appears to be healthy epithelialization. He has been using Promogran on the right and endoform on the left. He has been offloading the right foot with a heel offloading boot and he has a running shoe on the right foot 08/04/2022: The right foot remains closed. He has a thick cushioned insole in his sneaker. The left sided wound is smaller with just some slough and eschar accumulation. He is wearing the heel off loader on this foot. 08/15/2022: The right foot remains closed. The left wound has narrowed further. There is some slough and eschar accumulation. 08/25/2022: We put him in a peg assist shoe insert and as a result, he has more epithelialization of the ulcer with minimal slough and eschar accumulation. 09/01/2022: The wound is smaller by about half this week. Still with some slough on the surface. The peg assist shoe seems to be doing a remarkable job of adequately offloading the site. 09/08/2022: There is a little bit more epithelium coming in. There is some slough and callus buildup. 09/16/2022: The wound measures about the same size, but the epithelium that has grown in looks more robust and stronger. There is some slough and callus buildup. Patient History Information obtained from Patient. Family History Cancer - Maternal Grandparents, Diabetes - Father,Paternal Grandparents, Heart Disease - Maternal Grandparents, Hypertension - Father,Paternal Grandparents, Lung Disease - Siblings, No family history of Hereditary Spherocytosis, Kidney Disease, Seizures, Stroke, Thyroid Problems, Tuberculosis. Social History Never smoker, Marital Status - Married, Alcohol Use - Never, Drug Use - No History, Caffeine Use - Daily - tea,  soda. Medical History Eyes Denies history of Cataracts, Glaucoma, Optic Neuritis Ear/Nose/Mouth/Throat Denies history of Chronic sinus problems/congestion, Middle ear problems Hematologic/Lymphatic Denies history of Anemia, Hemophilia, Human Immunodeficiency Virus, Lymphedema, Sickle Cell Disease Respiratory Patient has history of Asthma Denies history of Aspiration, Chronic Obstructive Pulmonary Disease (COPD), Pneumothorax, Sleep Apnea, Tuberculosis Cardiovascular Patient has history of Angina - with COVID, Hypertension Denies history of Arrhythmia, Congestive Heart Failure, Coronary Artery Disease, Deep Vein Thrombosis, Hypotension, Myocardial Infarction, Peripheral Arterial Disease, Peripheral Venous Disease, Phlebitis, Vasculitis Gastrointestinal Denies history of Cirrhosis , Colitis, Crohnoos, Hepatitis A, Hepatitis B, Hepatitis C Endocrine [name], [name] ([id number]) [id number] Page 12 of 15 Denies history of Type I Diabetes, Type II Diabetes Genitourinary Denies history of End Stage Renal Disease Immunological Denies history of Lupus Erythematosus, Raynaudoos, Scleroderma Integumentary (Skin) Denies history of History of Burn Musculoskeletal Denies history of Gout, Rheumatoid Arthritis, Osteoarthritis, Osteomyelitis Neurologic Denies history of Dementia, Neuropathy, Quadriplegia, Paraplegia, Seizure Disorder Oncologic Denies history of Received Chemotherapy, Received Radiation Psychiatric Denies history of Anorexia/bulimia, Confinement Anxiety Hospitalization/Surgery History - COVID PNA 07/22/2019- 11/14/2019. - 03/27/2020 wound debridement/ skin graft. Medical A Surgical History Notes nd Constitutional Symptoms (General Health) COVID PNA 07/22/2019-11/14/2019 [facility] ECMO, foot drop left foot , Genitourinary kidney stone Psychiatric anxiety Objective Constitutional Slightly hypertensive. no acute distress. Vitals Time Taken: 10:38 AM,  Height: 69 in, Weight: 280 lbs, BMI: 41.3, Temperature: 98.4 F, Pulse: 88 bpm, Respiratory Rate: 16 breaths/min, Blood Pressure: 147/87 mmHg. Respiratory Normal work of breathing on room air. General Notes: 09/16/2022: The wound measures about the same size, but the epithelium that has grown in looks more robust and  stronger. There is some slough and callus buildup. Integumentary (Hair, Skin) Wound #2 status is Open. Original cause of wound was Pressure Injury. The date acquired was: 10/07/2019. The wound has been in treatment 113 weeks. The wound is located on the Left Calcaneus. The wound measures 1.1cm length x 0.5cm width x 0.1cm depth; 0.432cm^2 area and 0.043cm^3 volume. There is Fat Layer (Subcutaneous Tissue) exposed. There is no tunneling or undermining noted. There is a medium amount of serosanguineous drainage noted. The wound margin is thickened. There is medium (34-66%) red, pink granulation within the wound bed. There is a medium (34-66%) amount of necrotic tissue within the wound bed including Adherent Slough. The periwound skin appearance had no abnormalities noted for moisture. The periwound skin appearance had no abnormalities noted for color. The periwound skin appearance exhibited: Callus, Scarring. Periwound temperature was noted as No Abnormality. The periwound has tenderness on palpation. Assessment Active Problems ICD-10 Non-pressure chronic ulcer of other part of left foot with fat layer exposed Procedures Wound #2 Pre-procedure diagnosis of Wound #2 is a Pressure Ulcer located on the Left Calcaneus . There was a Selective/Open Wound Non-Viable Tissue Debridement with a total area of 0.55 sq cm performed by [name], MD. With the following instrument(s): Curette to remove Non-Viable tissue/material. Material removed includes Callus and Slough and after achieving pain control using Lidocaine 4% T opical Solution. No specimens were taken. A time out was  conducted at 10:50, prior to the start of the procedure. A Minimum amount of bleeding was controlled with Pressure. The procedure was tolerated well with a pain level of 0 throughout and a pain level of 0 following the procedure. Post Debridement Measurements: 1.1cm length x 0.5cm width x 0.1cm depth; 0.043cm^3 volume. Post debridement Stage noted as Category/Stage III. Character of Wound/Ulcer Post Debridement is improved. Post procedure Diagnosis Wound #2: Same as Pre-Procedure General Notes: Scribed for Dr. Lady Gary by Zenaida Deed, RN. SEANPAUL, PREECE ([id number]) [id number] Page 13 of 15 Plan Follow-up Appointments: Return Appointment in 1 week. - Dr. Lady Gary Room 2 Friday 2/16 @ 10:00 am Anesthetic: Wound #2 Left Calcaneus: (In clinic) Topical Lidocaine 4% applied to wound bed - In clinic Bathing/ Shower/ Hygiene: May shower and wash wound with soap and water. Edema Control - Lymphedema / SCD / Other: Avoid standing for long periods of time. Moisturize legs daily. - as needed Off-Loading: Other: - keep pressure off of the bottom of your feet. Elevate legs throughout the day. Use the Shoe with the PegAssist off-loading insole Additional Orders / Instructions: Follow Nutritious Diet - Try to get 70-100 grams of Protein a day+ WOUND #2: - Calcaneus Wound Laterality: Left Cleanser: Normal Saline (Generic) Every Other Day/30 Days Discharge Instructions: Cleanse the wound with Normal Saline prior to applying a clean dressing using gauze sponges, not tissue or cotton balls. Cleanser: Wound Cleanser (Generic) Every Other Day/30 Days Discharge Instructions: Cleanse the wound with wound cleanser prior to applying a clean dressing using gauze sponges, not tissue or cotton balls. Prim Dressing: Endoform 2x2 in (Generic) Every Other Day/30 Days ary Discharge Instructions: Moisten with saline or Hydrogel Secondary Dressing: Optifoam Non-Adhesive Dressing, 4x4 in  (Generic) Every Other Day/30 Days Discharge Instructions: Apply over primary dressing as directed. Secondary Dressing: Woven Gauze Sponge, Non-Sterile 4x4 in (Generic) Every Other Day/30 Days Discharge Instructions: Apply over primary dressing as directed. Secured With: 26M Medipore H Soft Cloth Surgical T ape, 4 x 10 (in/yd) (Generic) Every Other Day/30 Days Discharge Instructions: Secure with  tape as directed. Com pression Wrap: Kerlix Roll 4.5x3.1 (in/yd) Every Other Day/30 Days Discharge Instructions: Apply Kerlix and Coban compression as directed. 09/16/2022: The wound measures about the same size, but the epithelium that has grown in looks more robust and stronger. There is some slough and callus buildup. I used a curette to debride slough and callus from the wound. We will continue to use his Keystone topical antibiotic compound with endoform and a peg assist offloading shoe. Follow-up in 1 week. Electronic Signature(s) Signed: 09/16/2022 11:10:16 AM By: [name] MD FACS Entered By: [name] on 09/16/2022 11:10:16 -------------------------------------------------------------------------------- HxROS Details Patient Name: Date of Service: [name], TO [place] [name] 09/16/2022 10:00 A M Medical Record Number: [id number] Patient Account Number: [id number] Date of Birth/Sex: Treating RN: 06-Sep-1975 (47 y.o. M) Primary Care Provider: Dorinda Hill Other Clinician: Referring Provider: Treating Provider/Extender: Jana Hakim in Treatment: 113 Information Obtained From Patient Constitutional Symptoms (General Health) Medical History: Past Medical History Notes: COVID PNA 07/22/2019-11/14/2019 [facility] ECMO, foot drop left foot , Eyes Medical History: Negative for: Cataracts; Glaucoma; Optic Neuritis NAHIEM, DREDGE ([id number]) [id number] Page 14 of 15 Ear/Nose/Mouth/Throat Medical History: Negative for: Chronic sinus  problems/congestion; Middle ear problems Hematologic/Lymphatic Medical History: Negative for: Anemia; Hemophilia; Human Immunodeficiency Virus; Lymphedema; Sickle Cell Disease Respiratory Medical History: Positive for: Asthma Negative for: Aspiration; Chronic Obstructive Pulmonary Disease (COPD); Pneumothorax; Sleep Apnea; Tuberculosis Cardiovascular Medical History: Positive for: Angina - with COVID; Hypertension Negative for: Arrhythmia; Congestive Heart Failure; Coronary Artery Disease; Deep Vein Thrombosis; Hypotension; Myocardial Infarction; Peripheral Arterial Disease; Peripheral Venous Disease; Phlebitis; Vasculitis Gastrointestinal Medical History: Negative for: Cirrhosis ; Colitis; Crohns; Hepatitis A; Hepatitis B; Hepatitis C Endocrine Medical History: Negative for: Type I Diabetes; Type II Diabetes Genitourinary Medical History: Negative for: End Stage Renal Disease Past Medical History Notes: kidney stone Immunological Medical History: Negative for: Lupus Erythematosus; Raynauds; Scleroderma Integumentary (Skin) Medical History: Negative for: History of Burn Musculoskeletal Medical History: Negative for: Gout; Rheumatoid Arthritis; Osteoarthritis; Osteomyelitis Neurologic Medical History: Negative for: Dementia; Neuropathy; Quadriplegia; Paraplegia; Seizure Disorder Oncologic Medical History: Negative for: Received Chemotherapy; Received Radiation Psychiatric Medical History: Negative for: Anorexia/bulimia; Confinement Anxiety Past Medical History Notes: anxiety Immunizations Pneumococcal Vaccine: Received Pneumococcal Vaccination: No Implantable Devices CONAN, MCMANAWAY ([id number]) [id number] Page 15 of 15 None Hospitalization / Surgery History Type of Hospitalization/Surgery COVID PNA 07/22/2019- 11/14/2019 03/27/2020 wound debridement/ skin graft Family and Social History Cancer: Yes - Maternal Grandparents; Diabetes:  Yes - Father,Paternal Grandparents; Heart Disease: Yes - Maternal Grandparents; Hereditary Spherocytosis: No; Hypertension: Yes - Father,Paternal Grandparents; Kidney Disease: No; Lung Disease: Yes - Siblings; Seizures: No; Stroke: No; Thyroid Problems: No; Tuberculosis: No; Never smoker; Marital Status - Married; Alcohol Use: Never; Drug Use: No History; Caffeine Use: Daily - tea, soda; Financial Concerns: No; Food, Clothing or Shelter Needs: No; Support System Lacking: No; Transportation Concerns: No Electronic Signature(s) Signed: 09/16/2022 12:35:47 PM By: [name] MD FACS Entered By: [name] on 09/16/2022 11:09:02 -------------------------------------------------------------------------------- SuperBill Details Patient Name: Date of Service: [name], [name] 09/16/2022 Medical Record Number: [id number] Patient Account Number: [id number] Date of Birth/Sex: Treating RN: 07/04/1976 (46 y.o. M) Primary Care Provider: Dorinda Hill Other Clinician: Referring Provider: Treating Provider/Extender: Jana Hakim in Treatment: 113 Diagnosis Coding ICD-10 Codes Code Description [phone] Non-pressure chronic ulcer of other part of left foot with fat layer exposed Facility Procedures : CPT4 Code: [id number] Description: 97597 - DEBRIDE WOUND 1ST 20 SQ CM OR < ICD-10 Diagnosis Description  Z61.096 Non-pressure chronic ulcer of other part of left foot with fat layer exposed Modifier: Quantity: 1 Physician Procedures : CPT4 Code Description Modifier [id number] - WC PHYS LEVEL 4 - EST PT 25 ICD-10 Diagnosis Description L97.522 Non-pressure chronic ulcer of other part of left foot with fat layer exposed Quantity: 1 : [id number] - WC PHYS DEBR WO ANESTH 20 SQ CM ICD-10 Diagnosis Description L97.522 Non-pressure chronic ulcer of other part of left foot with fat layer exposed Quantity: 1 Electronic Signature(s) Signed: 09/16/2022 11:10:47 AM By: [name] MD  FACS Entered By: [name] on 09/16/2022 11:10:47

## 2022-09-21 ENCOUNTER — Ambulatory Visit (INDEPENDENT_AMBULATORY_CARE_PROVIDER_SITE_OTHER): Payer: PPO | Admitting: Family Medicine

## 2022-09-21 ENCOUNTER — Encounter (INDEPENDENT_AMBULATORY_CARE_PROVIDER_SITE_OTHER): Payer: Self-pay | Admitting: Family Medicine

## 2022-09-21 VITALS — BP 136/90 | HR 75 | Temp 98.4°F | Ht 68.0 in | Wt 303.0 lb

## 2022-09-21 DIAGNOSIS — R7303 Prediabetes: Secondary | ICD-10-CM

## 2022-09-21 DIAGNOSIS — I10 Essential (primary) hypertension: Secondary | ICD-10-CM

## 2022-09-21 DIAGNOSIS — R5383 Other fatigue: Secondary | ICD-10-CM | POA: Diagnosis not present

## 2022-09-21 DIAGNOSIS — Z6841 Body Mass Index (BMI) 40.0 and over, adult: Secondary | ICD-10-CM

## 2022-09-21 DIAGNOSIS — E66813 Obesity, class 3: Secondary | ICD-10-CM

## 2022-09-21 DIAGNOSIS — R0602 Shortness of breath: Secondary | ICD-10-CM | POA: Diagnosis not present

## 2022-09-21 DIAGNOSIS — F324 Major depressive disorder, single episode, in partial remission: Secondary | ICD-10-CM

## 2022-09-21 NOTE — Progress Notes (Signed)
Office: [phone]  /  Fax: [facility] Height: 5' 8"$  (1.727 m) Weight: 303 lb (137.4 kg) Temp: 98.4 F (36.9 C) Pulse Rate: 75 BP: (!) 136/90 SpO2: 92 % Fasting: yes Labs: yes Today's Visit #: 2 Weight at Last VIsit: 307lb Weight Lost Since Last Visit: 4lb Starting Date: 09/07/22 Starting Weight: 307lb Total Weight Loss (lbs): 4 lb (1.814 kg)    HPI  Chief Complaint: OBESITY  [name] is here to discuss his progress with his obesity treatment plan. He is not following any plan and states he is following his eating plan approximately 0 % of the time. He states he is doing arm/chair exercise for 45 minutes 3 times per week   Interval History:  Since last office visit he is down 4 lb IC and fasting labs done today (wasn't fasting at new patient visit) Eating out a lot Wife also working on weight loss 49 yo son with autism at home is a picky eater In wheelchair and seeing wound care once a week Overeats at meals and over snacks at times Added in a Premier Protein shake most days to help with wound healing  Pharmacotherapy: none  PHYSICAL EXAM:  Blood pressure (!) 136/90, pulse 75, temperature 98.4 F (36.9 C), height 5' 8"$  (1.727 m), weight (!) 303 lb (137.4 kg), SpO2 92 %. Body mass index is 46.07 kg/m.  General: He is overweight, cooperative, alert, well developed, and in no acute distress. PSYCH: Has normal mood, affect and thought process.   HEENT: EOMI, sclerae are anicteric. Lungs: Normal breathing effort, no conversational dyspnea. Extremities: No edema.  Neurologic: No gross sensory or motor deficits. No tremors or fasciculations noted.    DIAGNOSTIC DATA REVIEWED:  BMET    Component Value Date/Time   NA 140 08/31/2020 0000   K 4.2 08/31/2020 0000   CL 103 08/31/2020 0000   CO2 28 08/31/2020 0000   GLUCOSE 129 (H) 08/31/2020 0000   BUN 20 08/31/2020 0000   CREATININE 1.10 08/31/2020 0000    CALCIUM 9.1 08/31/2020 0000   GFRNONAA 80 08/31/2020 0000   GFRAA 93 08/31/2020 0000   Lab Results  Component Value Date   HGBA1C 5.7 (H) 08/31/2020   HGBA1C 5.7 (H) 09/30/2017   No results found for: "INSULIN" No results found for: "TSH" CBC    Component Value Date/Time   WBC 8.5 03/29/2022 1823   WBC 7.7 08/31/2020 0000   RBC 5.74 03/29/2022 1823   RBC 5.66 08/31/2020 0000   HGB 15.8 03/29/2022 1823   HCT 48.8 03/29/2022 1823   PLT 138 (L) 08/31/2020 0000   MCV 85 03/29/2022 1823   MCH 27.5 03/29/2022 1823   MCH 28.3 08/31/2020 0000   MCHC 32.4 03/29/2022 1823   MCHC 33.8 08/31/2020 0000   RDW 12.7 03/29/2022 1823   Iron Studies    Component Value Date/Time   FERRITIN 750 (H) 07/24/2019 0450   Lipid Panel     Component Value Date/Time   CHOL 148 04/18/2017 0336   TRIG 185 (H) 09/06/2019 1630   HDL 41 04/18/2017 0336   CHOLHDL 3.6 04/18/2017 0336   VLDL 12 04/18/2017 0336   LDLCALC 95 04/18/2017 0336   Hepatic Function Panel     Component Value Date/Time   PROT 7.3 08/31/2020 0000   ALBUMIN 2.9 (L) 12/02/2019 0628   AST 14 08/31/2020 0000   ALT 11 08/31/2020 0000   ALKPHOS 76 12/02/2019 [id number]  BILITOT 0.3 08/31/2020 0000   BILIDIR 0.2 08/24/2019 0352   IBILI 0.6 08/24/2019 0352   No results found for: "TSH" Nutritional No results found for: "VD25OH"   ASSESSMENT AND PLAN  TREATMENT PLAN FOR OBESITY:  Recommended Dietary Goals  [name] is currently in the action stage of change. As such, his goal is to continue weight management plan. He has agreed to the Category 4 Plan.  Behavioral Intervention  We discussed the following Behavioral Modification Strategies today: increasing lean protein intake, increasing vegetables, increasing fiber rich foods, increase water intake, work on meal planning and easy cooking plans, and think about ways to increase physical activity.  Additional resources provided today: NA  Recommended Physical Activity  Goals  [name] has been advised to work up to 150 minutes of moderate intensity aerobic activity a week and strengthening exercises 2-3 times per week for cardiovascular health, weight loss maintenance and preservation of muscle mass.   He has agreed to increase physical activity in their day and reduce sedentary time (increase NEAT).    Pharmacotherapy We discussed various medication options to help [name] with his weight loss efforts and we both agreed to none.  ASSOCIATED CONDITIONS ADDRESSED TODAY  Prediabetes Assessment & Plan: Labs drawn today including A1c Last A1c 5.7 Consider use of metformin or GLP-1 agonist if needed Reduce intake of added sugar and refined carbohydrates   SOBOE (shortness of breath on exertion) Assessment & Plan: IC test reviewed EKG is up-to-date Has a diagnosis of asthma   Obesity current BMI 46.08  Primary hypertension Assessment & Plan: Diastolic blood pressure mildly elevated Reports compliance with use of metoprolol 25 mg twice daily and amlodipine 2.5 mg once daily. Look for improvements in blood pressure control with reducing intake of high sodium foods, restaurant meals, weight loss and more regular physical activity   Major depressive disorder in partial remission, unspecified whether recurrent [facility]) Assessment & Plan: Emotional and boredom eating have been barriers to his progress with weight loss. Has a good support system at home Begin working on eating on a schedule on prescribed meal plan and practicing mindful eating Continue trazodone 100 mg, take 2 tabs at bedtime and Cymbalta 60 mg once daily per PCP   Morbid obesity ([place]) Assessment & Plan: Reviewed IC results with patient today showing a metabolic rate of 123456 cal/day. Begin category 4 meal plan Grocery shopping list and 200-calorie snack list provided Continue 1 Premier protein shake daily as a snack to aid in wound healing Reviewed easy options for dinner given high  frequency of meals out and working around picky eater's at home Will hopefully increase physical activity though limited by chronic foot wound       No follow-ups on file.Marland Kitchen He was informed of the importance of frequent follow up visits to maximize his success with intensive lifestyle modifications for his multiple health conditions.   ATTESTASTION STATEMENTS:  Reviewed by clinician on day of visit: allergies, medications, problem list, medical history, surgical history, family history, social history, and previous encounter notes.   Time spent on visit including pre-visit chart review and post-visit care and charting was 35 minutes.    Dell Ponto, DO

## 2022-09-21 NOTE — Assessment & Plan Note (Signed)
Labs drawn today including A1c Last A1c 5.7 Consider use of metformin or GLP-1 agonist if needed Reduce intake of added sugar and refined carbohydrates

## 2022-09-21 NOTE — Assessment & Plan Note (Signed)
Reviewed IC results with patient today showing a metabolic rate of 123456 cal/day. Begin category 4 meal plan Grocery shopping list and 200-calorie snack list provided Continue 1 Premier protein shake daily as a snack to aid in wound healing Reviewed easy options for dinner given high frequency of meals out and working around picky eater's at home Will hopefully increase physical activity though limited by chronic foot wound

## 2022-09-21 NOTE — Progress Notes (Signed)
TYLEN, BAAB ([id number]) [id number] Page 1 of 7 Visit Report for 09/16/2022 Arrival Information Details Patient Name: Date of Service: [name], [name] 09/16/2022 10:00 A M Medical Record Number: [id number] Patient Account Number: [id number] Date of Birth/Sex: Treating RN: 07-Mar-1976 (47 y.o. M) Primary Care [name]: [name] Other Clinician: Referring [name]: Treating [name]/Extender: [name] in Treatment: 20 Visit Information History Since Last Visit Added or deleted any medications: No Patient Arrived: Wheel Chair Any new allergies or adverse reactions: No Arrival Time: 10:38 Had a fall or experienced change in No Accompanied By: wife activities of daily living that may affect Transfer Assistance: None risk of falls: Patient Identification Verified: Yes Signs or symptoms of abuse/neglect since No Secondary Verification Process Completed: Yes last visito Patient Requires Transmission-Based Precautions: No Hospitalized since last visit: No Patient Has Alerts: No Implantable device outside of the clinic No excluding cellular tissue based products placed in the center since last visit: Has Dressing in Place as Prescribed: Yes Has Footwear/Offloading in Place as Yes Prescribed: Left: Surgical Shoe with Pressure Relief Insole Pain Present Now: Yes Electronic Signature(s) Signed: 09/16/2022 5:35:37 PM By: Baruch Gouty RN, BSN Entered By: Baruch Gouty on 09/16/2022 10:43:46 -------------------------------------------------------------------------------- Encounter Discharge Information Details Patient Name: Date of Service: [name], [name] 09/16/2022 10:00 A M Medical Record Number: [id number] Patient Account Number: [id number] Date of Birth/Sex: Treating RN: 1976/07/29 (47 y.o. [name] Primary Care [name]: [name] Other Clinician: Referring [name]: Treating [name]/Extender: [name] in Treatment: 113 Encounter Discharge Information Items Post Procedure Vitals Discharge Condition: Stable Temperature (F): 98.4 Ambulatory Status: Wheelchair Pulse (bpm): 88 Discharge Destination: Home Respiratory Rate (breaths/min): 18 Transportation: Private Auto Blood Pressure (mmHg): 147/87 Accompanied By: spouse Schedule Follow-up Appointment: Yes Clinical Summary of Care: Patient Declined Electronic Signature(s) Signed: 09/16/2022 5:35:37 PM By: Baruch Gouty RN, BSN Entered By: Baruch Gouty on 09/16/2022 12:07:02 [name] ([id number]) [id number] Page 2 of 7 -------------------------------------------------------------------------------- Lower Extremity Assessment Details Patient Name: Date of Service: [name] [name] 09/16/2022 10:00 A M Medical Record Number: [id number] Patient Account Number: [id number] Date of Birth/Sex: Treating RN: October 22, 1975 (47 y.o. [name] Primary Care [name]: [name] Other Clinician: Referring [name]: Treating [name]: [name] in Treatment: 113 Edema Assessment Assessed: [name]: No] [name]: No] Edema: [Left: N] [Right: o] Calf Left: Right: Point of Measurement: 29 cm From Medial Instep 44.1 cm Ankle Left: Right: Point of Measurement: 9 cm From Medial Instep 24.7 cm Vascular Assessment Pulses: Dorsalis Pedis Palpable: [Left:Yes] Electronic Signature(s) Signed: 09/16/2022 5:35:37 PM By: Baruch Gouty RN, BSN Entered By: Baruch Gouty on 09/16/2022 10:45:07 -------------------------------------------------------------------------------- Multi Wound Chart Details Patient Name: Date of Service: [name], [name] 09/16/2022 10:00 A M Medical Record Number: [id number] Patient Account Number: [id number] Date of Birth/Sex: Treating RN: [birth year] (47 y.o. M) Primary Care [name]: [name] Other Clinician: Referring  [name]: Treating [name]/Extender: [name] in Treatment: 113 Vital Signs Height(in): 69 Pulse(bpm): 88 Weight(lbs): 280 Blood Pressure(mmHg): 147/87 Body Mass Index(BMI): 41.3 Temperature(F): 98.4 Respiratory Rate(breaths/min): 16 [2:Photos:] [N/A:N/A] Left Calcaneus N/A N/A Wound Location: Pressure Injury N/A N/A Wounding Event: Pressure Ulcer N/A N/A Primary Etiology: Asthma, Angina, Hypertension N/A N/A Comorbid History: 10/07/2019 N/A N/A Date Acquired: 113 N/A N/A Weeks of Treatment: Open N/A N/A Wound Status: No N/A N/A Wound Recurrence: 1.1x0.5x0.1 N/A N/A Measurements L x W x D (cm) 0.432 N/A N/A A (cm) :  rea 0.043 N/A N/A Volume (cm) : 98.40% N/A N/A % Reduction in A rea: 99.80% N/A N/A % Reduction in Volume: Category/Stage III N/A N/A Classification: Medium N/A N/A Exudate A mount: Serosanguineous N/A N/A Exudate Type: red, brown N/A N/A Exudate Color: Thickened N/A N/A Wound Margin: Medium (34-66%) N/A N/A Granulation A mount: Red, Pink N/A N/A Granulation Quality: Medium (34-66%) N/A N/A Necrotic A mount: Fat Layer (Subcutaneous Tissue): Yes N/A N/A Exposed Structures: Fascia: No Tendon: No Muscle: No Joint: No Bone: No Small (1-33%) N/A N/A Epithelialization: Debridement - Selective/Open Wound N/A N/A Debridement: Pre-procedure Verification/Time Out 10:50 N/A N/A Taken: Lidocaine 4% T opical Solution N/A N/A Pain Control: Callus, Slough N/A N/A Tissue Debrided: Non-Viable Tissue N/A N/A Level: 0.55 N/A N/A Debridement A (sq cm): rea Curette N/A N/A Instrument: Minimum N/A N/A Bleeding: Pressure N/A N/A Hemostasis A chieved: 0 N/A N/A Procedural Pain: 0 N/A N/A Post Procedural Pain: Procedure was tolerated well N/A N/A Debridement Treatment Response: 1.1x0.5x0.1 N/A N/A Post Debridement Measurements L x W x D (cm) 0.043 N/A N/A Post Debridement Volume: (cm) Category/Stage III  N/A N/A Post Debridement Stage: Callus: Yes N/A N/A Periwound Skin Texture: Scarring: Yes Maceration: Yes N/A N/A Periwound Skin Moisture: No Abnormalities Noted N/A N/A Periwound Skin Color: No Abnormality N/A N/A Temperature: Yes N/A N/A Tenderness on Palpation: Debridement N/A N/A Procedures Performed: Treatment Notes Electronic Signature(s) Signed: 09/16/2022 11:08:16 AM By: Fredirick Maudlin MD FACS Entered By: Fredirick Maudlin on 09/16/2022 11:08:16 -------------------------------------------------------------------------------- Multi-Disciplinary Care Plan Details Patient Name: Date of Service: [name], [name] 09/16/2022 10:00 A M Medical Record Number: [id number] Patient Account Number: [id number] Date of Birth/Sex: Treating RN: 07/20/1976 (47 y.o. [name] Primary Care Vanden Fawaz: [name] Other Clinician: Referring Mildred Tuccillo: Treating Shaneka Efaw/Extender: [name] in Treatment: Iselin reviewed with physician [name], [name] ([id number]) [id number] Page 4 of 7 Active Inactive Wound/Skin Impairment Nursing Diagnoses: Impaired tissue integrity Knowledge deficit related [name] ulceration/compromised skin integrity Goals: Patient/caregiver will verbalize understanding of skin care regimen Date Initiated: 07/15/2020 Target Resolution Date: 03/08/2023 Goal Status: Active Ulcer/skin breakdown will have a volume reduction of 30% by week 4 Date Initiated: 07/15/2020 Date Inactivated: 08/20/2020 Target Resolution Date: 09/03/2020 Goal Status: Unmet Unmet Reason: no major changes. Ulcer/skin breakdown will heal within 14 weeks Date Initiated: 12/04/2020 Date Inactivated: 12/10/2020 Target Resolution Date: 12/10/2020 Unmet Reason: wounds still open at 14 Goal Status: Unmet weeks and today 21 weeks. Interventions: Assess patient/caregiver ability [name] obtain necessary supplies Assess  patient/caregiver ability [name] perform ulcer/skin care regimen upon admission and as needed Assess ulceration(s) every visit Provide education on ulcer and skin care Treatment Activities: Skin care regimen initiated : 07/15/2020 Topical wound management initiated : 07/15/2020 Notes: Electronic Signature(s) Signed: 09/16/2022 5:35:37 PM By: Baruch Gouty RN, BSN Entered By: Baruch Gouty on 09/16/2022 10:49:06 -------------------------------------------------------------------------------- Pain Assessment Details Patient Name: Date of Service: [name], [name] 09/16/2022 10:00 [name] Record Number: [id number] Patient Account Number: [id number] Date of Birth/Sex: Treating RN: 14-May-1976 (47 y.o. M) Primary Care Kariana Wiles: [name] Other Clinician: Referring Sukanya Goldblatt: Treating Dinah Lupa/Extender: [name] in Treatment: 113 Active Problems Location of Pain Severity and Description of Pain Patient Has Paino Yes Site Locations Pain Location: Pain in Ulcers With Dressing Change: No Duration of the Pain. Constant / Intermittento Intermittent Rate the pain. Current Pain Level: 4 Least Pain Level: 0 [name], [name] ([id number]) [id number] Page 5 of 7 Character of  Pain Describe the Pain: Aching Pain Management and Medication Current Pain Management: Medication: Yes Is the Current Pain Management Adequate: Adequate Rest: Yes How does your wound impact your activities of daily livingo Sleep: No Bathing: No Appetite: No Relationship With Others: No Bladder Continence: No Emotions: No Bowel Continence: No Work: No Toileting: No Drive: No Dressing: No Hobbies: No Engineer, maintenance) Signed: 09/16/2022 5:35:37 PM By: Baruch Gouty RN, BSN Entered By: Baruch Gouty on 09/16/2022 10:44:47 -------------------------------------------------------------------------------- Patient/Caregiver Education Details Patient Name:  Date of Service: [name], [name] [place] E. 2/9/2024andnbsp10:00 [place] Record Number: [id number] Patient Account Number: [id number] Date of Birth/Gender: Treating RN: 07/21/1976 (47 y.o. [name] Primary Care Physician: [name] Other Clinician: Referring Physician: Treating Physician/Extender: [name] in Treatment: 113 Education Assessment Education Provided [name]: Patient Education Topics Provided Offloading: Methods: Explain/Verbal Responses: Reinforcements needed, State content correctly Wound/Skin Impairment: Methods: Explain/Verbal Responses: Reinforcements needed, State content correctly Electronic Signature(s) Signed: 09/16/2022 5:35:37 PM By: Baruch Gouty RN, BSN Entered By: Baruch Gouty on 09/16/2022 10:49:31 -------------------------------------------------------------------------------- Wound Assessment Details Patient Name: Date of Service: [name], [name] 09/16/2022 10:00 A M Medical Record Number: [id number] Patient Account Number: [id number] Date of Birth/Sex: Treating RN: [birth year] (47 y.o. M) Primary Care Elexis Pollak: [name] Other Clinician: [name] ([id number]) [id number] Page 6 of 7 Referring Ahleah Simko: Treating Agusta Hackenberg/Extender: [name] in Treatment: 113 Wound Status Wound Number: 2 Primary Etiology: Pressure Ulcer Wound Location: Left Calcaneus Wound Status: Open Wounding Event: Pressure Injury Comorbid History: Asthma, Angina, Hypertension Date Acquired: 10/07/2019 Weeks Of Treatment: 113 Clustered Wound: No Photos Wound Measurements Length: (cm) 1.1 Width: (cm) 0.5 Depth: (cm) 0.1 Area: (cm) 0.432 Volume: (cm) 0.043 % Reduction in Area: 98.4% % Reduction in Volume: 99.8% Epithelialization: Small (1-33%) Tunneling: No Undermining: No Wound Description Classification: Category/Stage III Wound Margin: Thickened Exudate Amount:  Medium Exudate Type: Serosanguineous Exudate Color: red, brown Foul Odor After Cleansing: No Slough/Fibrino Yes Wound Bed Granulation Amount: Medium (34-66%) Exposed Structure Granulation Quality: Red, Pink Fascia Exposed: No Necrotic Amount: Medium (34-66%) Fat Layer (Subcutaneous Tissue) Exposed: Yes Necrotic Quality: Adherent Slough Tendon Exposed: No Muscle Exposed: No Joint Exposed: No Bone Exposed: No Periwound Skin Texture Texture Color No Abnormalities Noted: No No Abnormalities Noted: Yes Callus: Yes Temperature / Pain Scarring: Yes Temperature: No Abnormality Tenderness on Palpation: Yes Moisture No Abnormalities Noted: Yes Treatment Notes Wound #2 (Calcaneus) Wound Laterality: Left Cleanser Normal Saline Discharge Instruction: Cleanse the wound with Normal Saline prior [name] applying a clean dressing using gauze sponges, not tissue or cotton balls. Wound Cleanser Discharge Instruction: Cleanse the wound with wound cleanser prior [name] applying a clean dressing using gauze sponges, not tissue or cotton balls. Peri-Wound Care Topical Primary Dressing Endoform 2x2 in Discharge Instruction: Moisten with saline or Hydrogel [name], [name] ([id number]) [id number] Page 7 of 7 Secondary Dressing Optifoam Non-Adhesive Dressing, 4x4 in Discharge Instruction: Apply over primary dressing as directed. Woven Gauze Sponge, Non-Sterile 4x4 in Discharge Instruction: Apply over primary dressing as directed. Secured With 49M Medipore H Soft Cloth Surgical T ape, 4 x 10 (in/yd) Discharge Instruction: Secure with tape as directed. Compression Wrap Kerlix Roll 4.5x3.1 (in/yd) Discharge Instruction: Apply Kerlix and Coban compression as directed. Compression Stockings Add-Ons Electronic Signature(s) Signed: 09/16/2022 5:35:37 PM By: Baruch Gouty RN, BSN Entered By: Baruch Gouty on 09/16/2022  10:48:06 -------------------------------------------------------------------------------- Vitals Details Patient Name: Date of Service: [name], Evan 09/16/2022 10:00 A M Medical Record  Number: [id number] Patient Account Number: [id number] Date of Birth/Sex: Treating RN: [birth year] (47 y.o. M) Primary Care Meko Masterson: [name] Other Clinician: Referring Anshu Wehner: Treating Currie Dennin/Extender: [name] in Treatment: 113 Vital Signs Time Taken: 10:38 Temperature (F): 98.4 Height (in): 69 Pulse (bpm): 88 Weight (lbs): 280 Respiratory Rate (breaths/min): 16 Body Mass Index (BMI): 41.3 Blood Pressure (mmHg): 147/87 Reference Range: 80 - 120 mg / dl Electronic Signature(s) Signed: 09/21/2022 9:51:04 AM By: Sandre Kitty Entered By: Sandre Kitty on 09/16/2022 10:38:59

## 2022-09-21 NOTE — Assessment & Plan Note (Signed)
Emotional and boredom eating have been barriers to his progress with weight loss. Has a good support system at home Begin working on eating on a schedule on prescribed meal plan and practicing mindful eating Continue trazodone 100 mg, take 2 tabs at bedtime and Cymbalta 60 mg once daily per PCP

## 2022-09-21 NOTE — Assessment & Plan Note (Signed)
Diastolic blood pressure mildly elevated Reports compliance with use of metoprolol 25 mg twice daily and amlodipine 2.5 mg once daily. Look for improvements in blood pressure control with reducing intake of high sodium foods, restaurant meals, weight loss and more regular physical activity

## 2022-09-21 NOTE — Assessment & Plan Note (Signed)
IC test reviewed EKG is up-to-date Has a diagnosis of asthma

## 2022-09-23 ENCOUNTER — Encounter (HOSPITAL_BASED_OUTPATIENT_CLINIC_OR_DEPARTMENT_OTHER): Payer: PPO | Admitting: General Surgery

## 2022-09-23 DIAGNOSIS — Z3009 Encounter for other general counseling and advice on contraception: Secondary | ICD-10-CM | POA: Diagnosis not present

## 2022-09-23 DIAGNOSIS — L89623 Pressure ulcer of left heel, stage 3: Secondary | ICD-10-CM | POA: Diagnosis not present

## 2022-09-23 DIAGNOSIS — L97522 Non-pressure chronic ulcer of other part of left foot with fat layer exposed: Secondary | ICD-10-CM | POA: Diagnosis not present

## 2022-09-23 LAB — CBC WITH DIFFERENTIAL/PLATELET
Basophils Absolute: 0.1 10*3/uL (ref 0.0–0.2)
Basos: 1 %
EOS (ABSOLUTE): 0.1 10*3/uL (ref 0.0–0.4)
Eos: 1 %
Hematocrit: 51 % (ref 37.5–51.0)
Hemoglobin: 16 g/dL (ref 13.0–17.7)
Immature Grans (Abs): 0.1 10*3/uL (ref 0.0–0.1)
Immature Granulocytes: 1 %
Lymphocytes Absolute: 2 10*3/uL (ref 0.7–3.1)
Lymphs: 30 %
MCH: 26.4 pg — ABNORMAL LOW (ref 26.6–33.0)
MCHC: 31.4 g/dL — ABNORMAL LOW (ref 31.5–35.7)
MCV: 84 fL (ref 79–97)
Monocytes Absolute: 0.4 10*3/uL (ref 0.1–0.9)
Monocytes: 5 %
Neutrophils Absolute: 4.1 10*3/uL (ref 1.4–7.0)
Neutrophils: 62 %
Platelets: 132 10*3/uL — ABNORMAL LOW (ref 150–450)
RBC: 6.06 x10E6/uL — ABNORMAL HIGH (ref 4.14–5.80)
RDW: 12.7 % (ref 11.6–15.4)
WBC: 6.6 10*3/uL (ref 3.4–10.8)

## 2022-09-23 LAB — COMPREHENSIVE METABOLIC PANEL
ALT: 23 IU/L (ref 0–44)
AST: 21 IU/L (ref 0–40)
Albumin/Globulin Ratio: 1.5 (ref 1.2–2.2)
Albumin: 4 g/dL — ABNORMAL LOW (ref 4.1–5.1)
Alkaline Phosphatase: 91 IU/L (ref 44–121)
BUN/Creatinine Ratio: 18 (ref 9–20)
BUN: 17 mg/dL (ref 6–24)
Bilirubin Total: 0.4 mg/dL (ref 0.0–1.2)
CO2: 22 mmol/L (ref 20–29)
Calcium: 9.1 mg/dL (ref 8.7–10.2)
Chloride: 102 mmol/L (ref 96–106)
Creatinine, Ser: 0.94 mg/dL (ref 0.76–1.27)
Globulin, Total: 2.7 g/dL (ref 1.5–4.5)
Glucose: 99 mg/dL (ref 70–99)
Potassium: 4.3 mmol/L (ref 3.5–5.2)
Sodium: 145 mmol/L — ABNORMAL HIGH (ref 134–144)
Total Protein: 6.7 g/dL (ref 6.0–8.5)
eGFR: 100 mL/min/{1.73_m2} (ref >59)

## 2022-09-23 LAB — LIPID PANEL
Chol/HDL Ratio: 4.7 ratio (ref 0.0–5.0)
Cholesterol, Total: 156 mg/dL (ref 100–199)
HDL: 33 mg/dL — ABNORMAL LOW (ref >39)
LDL Chol Calc (NIH): 97 mg/dL (ref 0–99)
Triglycerides: 148 mg/dL (ref 0–149)
VLDL Cholesterol Cal: 26 mg/dL (ref 5–40)

## 2022-09-23 LAB — INSULIN, RANDOM: INSULIN: 21.5 u[IU]/mL (ref 2.6–24.9)

## 2022-09-23 LAB — FOLATE: Folate: 9.2 ng/mL (ref >3.0)

## 2022-09-23 LAB — VITAMIN D 25 HYDROXY (VIT D DEFICIENCY, FRACTURES): Vit D, 25-Hydroxy: 21.1 ng/mL — ABNORMAL LOW (ref 30.0–100.0)

## 2022-09-23 LAB — T4, FREE: Free T4: 1.18 ng/dL (ref 0.82–1.77)

## 2022-09-23 LAB — VITAMIN B12: Vitamin B-12: 548 pg/mL (ref 232–1245)

## 2022-09-23 LAB — HEMOGLOBIN A1C
Est. average glucose Bld gHb Est-mCnc: 128 mg/dL
Hgb A1c MFr Bld: 6.1 % — ABNORMAL HIGH (ref 4.8–5.6)

## 2022-09-23 LAB — TSH: TSH: 1.41 u[IU]/mL (ref 0.450–4.500)

## 2022-09-24 NOTE — Progress Notes (Signed)
JAYEL, FLADUNG ([id number]) [id number] Page 1 of 7 Visit Report for 09/23/2022 Arrival Information Details Patient Name: Date of Service: [name], [place] 09/23/2022 10:00 A M Medical Record Number: [id number] Patient Account Number: [id number] Date of Birth/Sex: Treating RN: 02-12-1976 (47 y.o. [name] Primary Care Macaila Tahir: [name] Other Clinician: Referring Jelene Albano: Treating Nephi Savage/Extender: [name] in Treatment: 56 Visit Information History Since Last Visit Added or deleted any medications: No Patient Arrived: Wheel Chair Any new allergies or adverse reactions: No Arrival Time: 10:38 Had a fall or experienced change in No Accompanied By: wife activities of daily living that may affect Transfer Assistance: Manual risk of falls: Patient Identification Verified: Yes Signs or symptoms of abuse/neglect since last visito No Secondary Verification Process Completed: Yes Hospitalized since last visit: No Patient Requires Transmission-Based Precautions: No Implantable device outside of the clinic excluding No Patient Has Alerts: No cellular tissue based products placed in the center since last visit: Has Dressing in Place as Prescribed: Yes Pain Present Now: Yes Electronic Signature(s) Signed: 09/23/2022 4:31:34 PM By: Adline Peals Entered By: Adline Peals on 09/23/2022 10:40:56 -------------------------------------------------------------------------------- Encounter Discharge Information Details Patient Name: Date of Service: [name], [place]. 09/23/2022 10:00 A M Medical Record Number: [id number] Patient Account Number: [id number] Date of Birth/Sex: Treating RN: 09-Dec-1975 (47 y.o. [name] Primary Care Ndrew Creason: [name] Other Clinician: Referring Dequandre Cordova: Treating Sheera Illingworth/Extender: [name] in Treatment: 114 Encounter Discharge Information  Items Post Procedure Vitals Discharge Condition: Stable Temperature (F): 98 Ambulatory Status: Wheelchair Pulse (bpm): 82 Discharge Destination: Home Respiratory Rate (breaths/min): 16 Transportation: Private Auto Blood Pressure (mmHg): 152/95 Accompanied By: wife Schedule Follow-up Appointment: Yes Clinical Summary of Care: Patient Declined Electronic Signature(s) Signed: 09/23/2022 4:31:34 PM By: Adline Peals Entered By: Adline Peals on 09/23/2022 11:41:58 [name] ([id number]) [id number] Page 2 of 7 -------------------------------------------------------------------------------- Lower Extremity Assessment Details Patient Name: Date of Service: [name] [name] 09/23/2022 10:00 A M Medical Record Number: [id number] Patient Account Number: [id number] Date of Birth/Sex: Treating RN: [birth year] (47 y.o. [name] Primary Care Dahiana Kulak: [name] Other Clinician: Referring Jontez Redfield: Treating Kaleiyah Polsky/Extender: [name] in Treatment: 114 Edema Assessment Assessed: [name]: No] [name]: No] Edema: [Left: N] [Right: o] Calf Left: Right: Point of Measurement: 29 cm From Medial Instep 44.1 cm Ankle Left: Right: Point of Measurement: 9 cm From Medial Instep 24.7 cm Electronic Signature(s) Signed: 09/23/2022 4:31:34 PM By: Adline Peals Entered By: Adline Peals on 09/23/2022 10:41:31 -------------------------------------------------------------------------------- Multi Wound Chart Details Patient Name: Date of Service: [name], [place] 09/23/2022 10:00 A M Medical Record Number: [id number] Patient Account Number: [id number] Date of Birth/Sex: Treating RN: 1976-08-02 (47 y.o. M) Primary Care Kawika Bischoff: [name] Other Clinician: Referring Lucylle Foulkes: Treating Santiaga Butzin/Extender: [name] in Treatment: 114 Vital Signs Height(in): 69 Pulse(bpm): 82 Weight(lbs):  280 Blood Pressure(mmHg): 152/95 Body Mass Index(BMI): 41.3 Temperature(F): 98 Respiratory Rate(breaths/min): 16 [2:Photos:] [N/A:N/A] Left Calcaneus N/A N/A Wound Location: Pressure Injury N/A N/A Wounding Event: Pressure Ulcer N/A N/A Primary Etiology: Asthma, Angina, Hypertension N/A N/A Comorbid History: 10/07/2019 N/A N/A Date Acquired: 114 N/A N/A Suella [place] of TreatmentYEICOB, [name] ([id number]) [id number] Page 3 of 7 Open N/A N/A Wound Status: No N/A N/A Wound Recurrence: 1.5x0.5x0.1 N/A N/A Measurements L x W x D (cm) 0.589 N/A N/A A (cm) : rea 0.059 N/A N/A Volume (cm) : 97.80% N/A N/A % Reduction in A rea:  99.80% N/A N/A % Reduction in Volume: Category/Stage III N/A N/A Classification: Medium N/A N/A Exudate A mount: Serosanguineous N/A N/A Exudate Type: red, brown N/A N/A Exudate Color: Thickened N/A N/A Wound Margin: Large (67-100%) N/A N/A Granulation A mount: Red, Pink N/A N/A Granulation Quality: Small (1-33%) N/A N/A Necrotic A mount: Fat Layer (Subcutaneous Tissue): Yes N/A N/A Exposed Structures: Fascia: No Tendon: No Muscle: No Joint: No Bone: No Medium (34-66%) N/A N/A Epithelialization: Debridement - Excisional N/A N/A Debridement: Pre-procedure Verification/Time Out 10:47 N/A N/A Taken: Lidocaine 4% Topical Solution N/A N/A Pain Control: Subcutaneous, Slough N/A N/A Tissue Debrided: Skin/Subcutaneous Tissue N/A N/A Level: 0.75 N/A N/A Debridement A (sq cm): rea Curette N/A N/A Instrument: Minimum N/A N/A Bleeding: Pressure N/A N/A Hemostasis A chieved: Procedure was tolerated well N/A N/A Debridement Treatment Response: 1.5x0.5x0.1 N/A N/A Post Debridement Measurements L x W x D (cm) 0.059 N/A N/A Post Debridement Volume: (cm) Category/Stage III N/A N/A Post Debridement Stage: Callus: Yes N/A N/A Periwound Skin Texture: Scarring: Yes Maceration: Yes N/A N/A Periwound Skin  Moisture: No Abnormalities Noted N/A N/A Periwound Skin Color: No Abnormality N/A N/A Temperature: Debridement N/A N/A Procedures Performed: Treatment Notes Electronic Signature(s) Signed: 09/23/2022 10:52:34 AM By: Fredirick Maudlin MD FACS Entered By: Fredirick Maudlin on 09/23/2022 10:52:34 -------------------------------------------------------------------------------- Multi-Disciplinary Care Plan Details Patient Name: Date of Service: [name], [place] E. 09/23/2022 10:00 A M Medical Record Number: [id number] Patient Account Number: [id number] Date of Birth/Sex: Treating RN: 1975/12/25 (47 y.o. [name] Primary Care Adeena Bernabe: [name] Other Clinician: Referring Dajah Fischman: Treating Laketia Vicknair/Extender: [name] in Treatment: [place] reviewed with physician Active Inactive Wound/Skin Impairment Nursing Diagnoses: Impaired tissue integrity Knowledge deficit related to ulceration/compromised skin integrity Goals: [name], [name] ([id number]) [id number] Page 4 of 7 Patient/caregiver will verbalize understanding of skin care regimen Date Initiated: 07/15/2020 Target Resolution Date: 03/08/2023 Goal Status: Active Ulcer/skin breakdown will have a volume reduction of 30% by week 4 Date Initiated: 07/15/2020 Date Inactivated: 08/20/2020 Target Resolution Date: 09/03/2020 Goal Status: Unmet Unmet Reason: no major changes. Ulcer/skin breakdown will heal within 14 weeks Date Initiated: 12/04/2020 Date Inactivated: 12/10/2020 Target Resolution Date: 12/10/2020 Unmet Reason: wounds still open at 14 Goal Status: Unmet weeks and today 21 weeks. Interventions: Assess patient/caregiver ability to obtain necessary supplies Assess patient/caregiver ability to perform ulcer/skin care regimen upon admission and as needed Assess ulceration(s) every visit Provide education on ulcer and skin care Treatment  Activities: Skin care regimen initiated : 07/15/2020 Topical wound management initiated : 07/15/2020 Notes: Electronic Signature(s) Signed: 09/23/2022 4:31:34 PM By: Adline Peals Entered By: Adline Peals on 09/23/2022 10:48:51 -------------------------------------------------------------------------------- Pain Assessment Details Patient Name: Date of Service: [name], [place] 09/23/2022 10:00 [place] Record Number: [id number] Patient Account Number: [id number] Date of Birth/Sex: Treating RN: 1976/08/07 (47 y.o. [name] Primary Care Kamonte Mcmichen: [name] Other Clinician: Referring Steen Bisig: Treating Kaydan Wilhoite/Extender: [name] in Treatment: 114 Active Problems Location of Pain Severity and Description of Pain Patient Has Paino Yes Site Locations Pain Location: Pain in Ulcers Rate the pain. Current Pain Level: 4 Pain Management and Medication Current Pain Management: Electronic Signature(s) Signed: 09/23/2022 4:31:34 PM By: Adline Peals Entered By: Adline Peals on 09/23/2022 10:41:21 [name] ([id number]) [id number] Page 5 of 7 -------------------------------------------------------------------------------- Patient/Caregiver Education Details Patient Name: Date of Service: [name] [place] E. 2/16/2024andnbsp10:00 A M Medical Record Number: [id number] Patient Account Number: [id number] Date of Birth/Gender: Treating  RN: July 10, 1976 (47 y.o. [name] Primary Care Physician: [name] Other Clinician: Referring Physician: Treating Physician/Extender: [name] in Treatment: 114 Education Assessment Education Provided To: Patient Education Topics Provided Safety: Methods: Explain/Verbal Responses: Reinforcements needed, State content correctly Electronic Signature(s) Signed: 09/23/2022 4:31:34 PM By: Adline Peals Entered By:  Adline Peals on 09/23/2022 10:49:06 -------------------------------------------------------------------------------- Wound Assessment Details Patient Name: Date of Service: [name], [place] 09/23/2022 10:00 A M Medical Record Number: [id number] Patient Account Number: [id number] Date of Birth/Sex: Treating RN: 1976-05-12 (47 y.o. [name] Primary Care Marlaine Arey: [name] Other Clinician: Referring Dahir Ayer: Treating Johnavon Mcclafferty/Extender: [name] in Treatment: 114 Wound Status Wound Number: 2 Primary Etiology: Pressure Ulcer Wound Location: Left Calcaneus Wound Status: Open Wounding Event: Pressure Injury Comorbid History: Asthma, Angina, Hypertension Date Acquired: 10/07/2019 Weeks Of Treatment: 114 Clustered Wound: No Photos Wound Measurements [name], [name] ([id number]) Length: (cm) 1.5 Width: (cm) 0.5 Depth: (cm) 0.1 Area: (cm) 0.589 Volume: (cm) 0.059 [id number] Page 6 of 7 % Reduction in Area: 97.8% % Reduction in Volume: 99.8% Epithelialization: Medium (34-66%) Tunneling: No Undermining: No Wound Description Classification: Category/Stage III Wound Margin: Thickened Exudate Amount: Medium Exudate Type: Serosanguineous Exudate Color: red, brown Foul Odor After Cleansing: No Slough/Fibrino Yes Wound Bed Granulation Amount: Large (67-100%) Exposed Structure Granulation Quality: Red, Pink Fascia Exposed: No Necrotic Amount: Small (1-33%) Fat Layer (Subcutaneous Tissue) Exposed: Yes Necrotic Quality: Adherent Slough Tendon Exposed: No Muscle Exposed: No Joint Exposed: No Bone Exposed: No Periwound Skin Texture Texture Color No Abnormalities Noted: No No Abnormalities Noted: Yes Callus: Yes Temperature / Pain Scarring: Yes Temperature: No Abnormality Moisture No Abnormalities Noted: Yes Treatment Notes Wound #2 (Calcaneus) Wound Laterality: Left Cleanser Normal Saline Discharge  Instruction: Cleanse the wound with Normal Saline prior to applying a clean dressing using gauze sponges, not tissue or cotton balls. Wound Cleanser Discharge Instruction: Cleanse the wound with wound cleanser prior to applying a clean dressing using gauze sponges, not tissue or cotton balls. Peri-Wound Care Topical Primary Dressing Endoform 2x2 in Discharge Instruction: Moisten with saline or Hydrogel Secondary Dressing Optifoam Non-Adhesive Dressing, 4x4 in Discharge Instruction: Apply over primary dressing as directed. Woven Gauze Sponge, Non-Sterile 4x4 in Discharge Instruction: Apply over primary dressing as directed. Secured With 50M Medipore H Soft Cloth Surgical T ape, 4 x 10 (in/yd) Discharge Instruction: Secure with tape as directed. Compression Wrap Kerlix Roll 4.5x3.1 (in/yd) Discharge Instruction: Apply Kerlix and Coban compression as directed. Compression Stockings Add-Ons Electronic Signature(s) Signed: 09/23/2022 4:31:34 PM By: Adline Peals Entered By: Adline Peals on 09/23/2022 10:44:40 [name] ([id number]) [id number] Page 7 of 7 -------------------------------------------------------------------------------- Vitals Details Patient Name: Date of Service: [name], [place] 09/23/2022 10:00 A M Medical Record Number: [id number] Patient Account Number: [id number] Date of Birth/Sex: Treating RN: [birth year] (47 y.o. [name] Primary Care Wymon Swaney: [name] Other Clinician: Referring Lizann Edelman: Treating Amer Alcindor/Extender: [name] in Treatment: 114 Vital Signs Time Taken: 10:40 Temperature (F): 98 Height (in): 69 Pulse (bpm): 82 Weight (lbs): 280 Respiratory Rate (breaths/min): 16 Body Mass Index (BMI): 41.3 Blood Pressure (mmHg): 152/95 Reference Range: 80 - 120 mg / dl Electronic Signature(s) Signed: 09/23/2022 4:31:34 PM By: Adline Peals Entered By: Adline Peals on 09/23/2022 10:41:10

## 2022-09-24 NOTE — Progress Notes (Signed)
SOLOMON, LILLQUIST ([id number]) [id number] Page 1 of 16 Visit Report for 09/23/2022 Chief Complaint Document Details Patient Name: Date of Service: [name], [name] 09/23/2022 10:00 A M Medical Record Number: [id number] Patient Account Number: [id number] Date of Birth/Sex: Treating RN: November 16, 1975 (47 y.o. M) Primary Care Provider: [name] Other Clinician: Referring Provider: Treating Provider/Extender: [name] in Treatment: [name] from: Patient Chief Complaint Bilateral Plantar Foot Ulcers Electronic Signature(s) Signed: 09/23/2022 10:52:45 AM By: Fredirick Maudlin MD FACS Entered By: Fredirick Maudlin on 09/23/2022 10:52:44 -------------------------------------------------------------------------------- Debridement Details Patient Name: Date of Service: [name], [name] 09/23/2022 10:00 A M Medical Record Number: [id number] Patient Account Number: [id number] Date of Birth/Sex: Treating RN: 10-01-1975 (47 y.o. [name] Primary Care Provider: [name] Other Clinician: Referring Provider: Treating Provider/Extender: [name] in Treatment: 114 Debridement Performed for Assessment: Wound #2 Left Calcaneus Performed By: Physician Fredirick Maudlin, MD Debridement Type: Debridement Level of Consciousness (Pre-procedure): Awake and Alert Pre-procedure Verification/Time Out Yes - 10:47 Taken: Start Time: 10:47 Pain Control: Lidocaine 4% T opical Solution T Area Debrided (L x W): otal 1.5 (cm) x 0.5 (cm) = 0.75 (cm) Tissue and other material debrided: Non-Viable, Slough, Subcutaneous, Skin: Epidermis, Slough Level: Skin/Subcutaneous Tissue Debridement Description: Excisional Instrument: Curette Bleeding: Minimum Hemostasis Achieved: Pressure Response to Treatment: Procedure was tolerated well Level of Consciousness (Post- Awake and Alert procedure): Post Debridement  Measurements of Total Wound Length: (cm) 1.5 Stage: Category/Stage III Width: (cm) 0.5 Depth: (cm) 0.1 Volume: (cm) 0.059 Character of Wound/Ulcer Post Debridement: Improved Post Procedure Diagnosis Same as [name], [name] ([id number]) [id number] Page 2 of 16 Notes scribed for Dr. [name] by Adline Peals, RN Electronic Signature(s) Signed: 09/23/2022 11:29:12 AM By: Fredirick Maudlin MD FACS Signed: 09/23/2022 4:31:34 PM By: Sabas Sous By: Adline Peals on 09/23/2022 10:49:15 -------------------------------------------------------------------------------- HPI Details Patient Name: Date of Service: [name], [name] 09/23/2022 10:00 A M Medical Record Number: [id number] Patient Account Number: [id number] Date of Birth/Sex: Treating RN: Sep 18, 1975 (47 y.o. M) Primary Care Provider: [name] Other Clinician: Referring Provider: Treating Provider/Extender: [name] in Treatment: 114 History of Present Illness HPI Description: Wounds are12/03/2020 upon evaluation today patient presents for initial inspection here in our clinic concerning issues he has been having with the bottoms of his feet bilaterally. He states these actually occurred as wounds when he was hospitalized for 5 months secondary to Covid. He was apparently with tilting bed where he was in an upright position quite frequently and apparently this occurred in some way shape or form during that time. Fortunately there is no sign of active infection at this time. No fevers, chills, nausea, vomiting, or diarrhea. With that being said he still has substantial wounds on the plantar aspects of his feet Theragen require quite a bit of work to get these to heal. He has been using Santyl currently though that is been problematic both in receiving the medication as well as actually paid for it as it is become quite expensive. Prior to the  experience with Covid the patient really did not have any major medical problems other than hypertension he does have some mild generalized weakness following the Covid experience. 07/22/2020 on evaluation today patient appears to be doing okay in regard to his foot ulcers I feel like the wound beds are showing signs of better improvement that I do believe the Iodoflex is helping in this regard. With that  being said he does have a lot of drainage currently and this is somewhat blue/green in nature which is consistent with Pseudomonas. I do think a culture today would be appropriate for [place] to evaluate and see if that is indeed the case I would likely start him on antibiotic orally as well he is not allergic to Cipro knows of no issues he has had in the past 12/21; patient was admitted to the clinic earlier this month with bilateral presumed pressure ulcers on the bottom of his feet apparently related to excessive pressure from a tilt table arrangement in the intensive care unit. Patient relates this to being on ECMO but I am not really sure that is exactly related to that. I must say I have never seen anything like this. He has fairly extensive full-thickness wounds extending from his heel towards his midfoot mostly centered laterally. There is already been some healing distally. He does not appear to have an arterial issue. He has been using gentamicin to the wound surfaces with Iodoflex to help with ongoing debridement 1/6; this is a patient with pressure ulcers on the bottom of his feet related to excessive pressure from a standing position in the intensive care unit. He is complaining of a lot of pain in the right heel. He is not a diabetic. 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 We have been using Iodoflex to help prepare the surfaces of both wounds for an advanced treatment product. He is nonambulatory spending most of his time in a wheelchair I have asked him not to  propel the wheelchair with his heels 1/13; in general his wounds look better not much surface area change we have been using Iodoflex as of last week. I did an x-ray of the right heel as the patient was complaining of pain. I had some thoughts about a stress fracture perhaps Achilles tendon problems however what it showed was erosive changes along the inferior aspect of the calcaneus he now has a MRI booked for 1/20. 1/20; in general his wounds continue to be better. Some improvement in the large narrow areas proximally in his foot. He is still complaining of pain in the right heel and tenderness in certain areas of this wound. His MRI is tonight. I am not just looking for osteomyelitis that was brought up on the x-ray I am wondering about stress fractures, tendon ruptures etc. He has no such findings on the left. Also noteworthy is that the patient had critical illness neuropathy and some of the discomfort may be actual improvement in nerve function I am just not sure. These wounds were initially in the setting of severe critical illness related to COVID-19. He was put in a standing position. He may have also been on pressors at the point contributing to tissue ischemia. By his description at some point these wounds were grossly necrotic extending proximally up into the Achilles part of his heel. I do not know that I have ever really seen pictures of them like this although they may exist in epic We have ordered Tri layer Oasis. I am trying to stimulate some granulation in these areas. This is of course assuming the MRI is negative for infection 1/27; since the patient was last here he saw Dr. Juleen China of infectious disease. He is planned for vancomycin and ceftriaxone. Prior operative culture grew MSSA. Also ordered baseline lab work. He also ordered arterial studies although the ABIs in our clinic were normal as well as his  clinical exam these were normal I do not think he needs to see vascular  surgery. His ABIs at the [facility] were 1.22 in the right triphasic waveforms with a normal TBI of 1.15 on the left ABI of 1.22 with triphasic waveforms and a normal TBI of 1.08. Finally he saw Dr. Amalia Hailey who will follow him in for 2 months. At this point I do not think he felt that he needed a procedure on the right calcaneal bone. Dr. Juleen China is elected for broad-spectrum antibiotic The patient is still having pain in the right heel. He walks with a walker 2/3; wounds are generally smaller. He is tolerating his IV antibiotics. I believe this is vancomycin and ceftriaxone. We are still waiting for Oasis burn in terms of his out-of-pocket max which he should be meeting soon given the IV antibiotics, MRIs etc. I have asked him to check in on this. We are using silver collagen in the meantime the wounds look better 2/10; tolerating IV vancomycin and Rocephin. We are waiting to apply for Oasis. Although I am not really sure where he is in his out-of-pocket max. 2/17 started the first application of Oasis trilayer. Still on antibiotics. The wounds have generally look better. The area on the left has a little more surface slough requiring debridement [id number]; second application of Oasis trilayer. The wound surface granulation is generally look better. The area on the left with undermining laterally I think is come in a bit. [name], [name] ([id number]) [id number] Page 3 of 16 10/08/2020 upon evaluation today patient is here today for [employer] application #3. Fortunately he seems to be doing extremely well with regard to this and we are seeing a lot of new epithelial growth which is great news. Fortunately there is no signs of active infection at this time. 10/16/2020 upon evaluation today patient appears to be doing well with regard to his foot ulcers. Do believe the Oasis has been of benefit for him. I do not see any signs of infection right now which is great news and I think that  he has a lot of new epithelial growth which is great to see as well. The patient is very pleased to hear all of this. I do think we can proceed with the Oasis trilayer #4 today. 3/18; not as much improvement in these areas on his heels that I was hoping. I did reapply trilateral Oasis today the tissue looks healthier but not as much fill in as I was hoping. 3/25; better looking today I think this is come in a bit the tissue looks healthier. Triple layer Oasis reapplied #6 4/1; somewhat better looking definitely better looking surface not as much change in surface area as I was hoping. He may be spending more time [place] on days then he needs to although he does have heel offloading boots. Triple layer Oasis reapplied #7 4/7; unfortunately apparently [facility] will not approve any further Oasis which is unfortunate since the patient did respond nicely both in terms of the condition of the wound bed as well as surface area. There is still some drainage coming from the wound but not a lot there does not appear to be any infection 4/15; we have been using Hydrofera Blue. He continues to have nice rims of epithelialization on the right greater than the left. The left the epithelialization is coming from the tip of his heel. There is moderate drainage. In this that concerns me about a total contact cast. There  is no evidence of infection 4/29; patient has been using Hydrofera Blue with dressing changes. He has no complaints or issues today. 5/5; using Hydrofera Blue. I actually think that he looks marginally better than the last time I saw this 3 weeks ago. There are rims of epithelialization on the left thumb coming from the medial side on the right. Using Hydrofera Blue 5/12; using Hydrofera Blue. These continue to make improvements in surface area. His drainage was not listed as severe I therefore went ahead and put a cast on the left foot. Right foot we will continue to dress his  previous 5/16; back for first total contact cast change. He did not tolerate this particularly well cast injury on the anterior tibia among other issues. Difficulty sleeping. I talked him about this in some detail and afterwards is elected to continue. I told him I would like to have a cast on for 3 weeks to see if this is going to help at all. I think he agreed 5/19; I think the wound is better. There is no tunneling towards his midfoot. The undermining medially also looks better. He has a rim of new skin distally. I think we are making progress here. The area on the left also continues to look somewhat better to me using Hydrofera Blue. He has a list of complaints about the cast but none of them seem serious 5/26; patient presents for 1 week follow-up. He has been using a total contact cast and tolerating this well. Hydrofera Blue is the main dressing used. He denies signs of infection. 6/2 Hydrofera Blue total contact cast on the left. These were large ulcers that formed in intensive care unit where the patient was recovering from [place]. May have had something to do with being ventilated in an upright positiono Pressors etc. We have been able to get the areas down considerably and a viable surface. There is some epithelialization in both sides. Note made of drainage 6/9; changed to [facility] last time because of drainage. He arrives with better looking surfaces and dimensions on the left than the right. Paradoxically the right actually probes more towards his midfoot the left is largely close down but both of these look improved. Using a total contact cast on the left 6/16; complex wounds on his bilateral plantar heels which were initially pressure injury from a stay in the ICU with COVID. We have been using silver alginate most recently. His dimensions of come in quite dramatically however not recently. We have been putting the left foot in a total contact cast 6/23; complex wounds on the  bilateral plantar heels. I been putting the left in the cast paradoxically the area on the right is the one that is going towards closure at a faster rate. Quite a bit of drainage on the left. The patient went to see Dr. Amalia Hailey who said he was going to standby for skin grafts. I had actually considered sending him for skin grafts however he would be mandatorily off his feet for a period of weeks to months. I am thinking that the area on the right is going to close on its own the area on the left has been more stubborn even though we have him in a total contact cast 6/30; took him out of a total contact cast last week is the right heel seem to be making better progress than the left where I was placing the cast. We are using silver alginate. Both wounds are smaller right greater  than left 7/12; both wounds look as though they are making some progress. We are using silver alginate. Heel offloading boots 7/26; very gradual progress especially on the right. Using silver alginate. He is wearing heel offloading boots 8/18; he continues to close these wounds down very gradually. Using silver alginate. The problem polymen being definitive about this is areas of what appears to be callus around the margins. This is not a 100% of the area but certainly sizable especially on the right 9/1; bilateral plantar feet wounds secondary to prolonged pressure while being ventilated for COVID-19 in an upright position. Essentially pressure ulcers on the bottom of his feet. He is made substantial progress using silver alginate. 9/14; bilateral plantar feet wounds secondary to prolonged pressure. Making progress using silver alginate. 9/29 bilateral plantar feet wounds secondary to prolonged pressure. I changed him to Iodoflex last week. MolecuLight showing reddened blush fluorescence 10/11; patient presents for follow-up. He has no issues or complaints today. He denies signs of infection. He continues to use Iodoflex and  antibiotic ointment to the wound beds. 10/27; 2-week follow-up. No evidence of infection. He has callus and thick dry skin around the wound margins we have been using Iodoflex and Bactroban which was in response to a moderate left MolecuLight reddish blush fluorescence. 11/10; 2-week follow-up. Wound margins again have thick callus however the measurements of the actual wound sites are a lot smaller. Everything looks reasonably healthy here. We have been using Iodoflex He was approved for prime matrix but I have elected to delay this given the improvement in the surface area. Hopefully I will not regret that decision as were getting close to the end of the year in terms of insurance payment 12/8; 2-week follow-up. Wounds are generally smaller in size. These were initially substantial wounds extending into the forefoot all the way into the heel on the bilateral plantar feet. They are now both located on the plantar heel distal aspect both of these have a lot of callus around the wounds I used a #5 curette to remove this on the right and the left also some subcutaneous debris to try and get the wound edges were using Iodoflex. He has heel offloading shoe 12/22; 2-week follow-up. Not really much improvement. He has thick callus around the outer edges of both wounds. I remove this there is some nonviable subcutaneous tissue as well. We have been using Iodoflex. Her intake nurse and myself spontaneously thought of a total contact cast I went back in May. At that time we really were not seeing much of an improvement with a cast although the wound was in a much different situation I would like to retry this in 2 weeks and I discussed this with the patient 08/12/2021; the patient has had some improvement with the Iodoflex. The the area on the left heel plantar more improved than the right. I had to put him in a total contact cast on the left although I decided to put that off for 2 weeks. I am going to change  his primary dressing to silver collagen. I think in both areas he has had some improvement most of the healing seems to be more proximal in the heel. The wounds are in the mid aspect. A lot of thick callus on the right heel however. 1/19; we are using silver collagen on both plantar heel areas. He has had some improvement today. The left did not require any debridement. He still had some eschar on the right that was  debrided but both seem to have contracted. I did not put it total contact cast on him today 2/2 we have been using silver collagen. The area on the right plantar heel has areas that appear to be epithelialized interspersed with dry flaking callus and dry skin. I removed this. This really looks better than on the other side. On the left still a large area with raised edges and debris on the surface. 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 He has no issues or complaints today. 2/9; not much change in the left foot wound with 1 week of a cast we are using silver collagen. Silver collagen on the right side. The right side has been the better wound surface. We will reapply the total contact cast on the left 2/16; not much improvement on either side I been using silver collagen with a total contact cast on the left. I'm changing the Hydrofera Blue still with a total contact cast on the left 2/23; some improvement on both sides. Disappointing that he has thick callus around the area that we are putting in a total contact cast on the left. We've been using Hydrofera Blue on both wound areas. This is a man who at essentially pressure ulcers in addition to ischemia caused by medications to support his blood pressure (pressors) in the ICU. He was [name], [name] ([id number]) [id number] Page 4 of 16 being ventilated in the standing  position for severe Covid. A Shiley the wounds extended across his entire foot but are now localized to his plantar heels bilaterally. We have made progress however neither areas healed. I continue to think the total contact cast is helped albeit painstakingly slowly. He has never wanted a plastic surgery consult although I don't know that they would be interested in grafting in area in this location. 10/07/2021: Continued improvement bilaterally. There is still some callus around the left wound, despite the total contact cast. He has some increased pain in his right midfoot around 1 particular area. This has been painful in the past but seems to be a little bit worse. When his cast was removed today, there was an area on the heel of the left foot that looks a bit macerated. He is also complaining of pain in his left thigh and hip which he thinks is secondary to the limb length discrepancy caused by the cast. 10/14/2021: He continues to improve. A little bit less callus around the left wound. He continues to endorse pain in his right midfoot, but this is not as significant as it was last week. The maceration on his left heel is improved. 10/21/2021: Continued improvement to both wounds. The maceration on his left heel is no longer evident. Less callus bilaterally. Epithelialization progressing. 10/28/2021: Significant improvement this week. The right sided wound is nearly closed with just a small open area at the middle. No maceration seen on the left heel. Continued epithelialization on both sides. No concern for infection. 11/04/2021: T oday, the wounds were measured a little bit differently and come out as larger, but I actually think they are about the same to potentially even smaller, particularly on the left. He continues to accumulate some callus on the right. 11/11/2021: T oday, the patient is expressing some concern that the left wound, despite being in the total contact cast, is not progressing at the  same rate  as the 1 on the right. He is interested in trying a week without the cast to see how the wound does. The wounds are roughly the same size as last week, with the right perhaps being a little bit smaller. He continues to build up callus on both sites. 11/18/2021: Last week, I permitted the patient to go without his total contact cast, just to see if the cast was really making any difference. Today, both wounds have deteriorated to some extent, suggesting that the cast is providing benefit, at least on the left. Both are larger and have accumulated callus, slough, and other debris. 11/26/2021: I debrided both wounds quite aggressively last week in an effort to stimulate the healing cascade. This appears to have been effective as the left sided wound is a full centimeter shorter in length. Although the right was measured slightly larger, on inspection, it looks as though an area of epithelialized tissue was included in the measurements. We have been using PolyMem Ag on the wound surfaces with a total contact cast to the left. 12/02/2021: It appears that the intake personnel are including epithelialized tissue in his wound measurements; the right wound is almost completely epithelialized; there is just a crater at the proximal midfoot with some open areas. On the left, he has built up some callus, but the overall wound surface looks good. There is some senescent skin around the wound margin. He has been in PolyMem Ag bilaterally with a total contact cast on the left. 12/09/2021: The right wound is nearly closed; there is just a small open area at the mid calcaneus. On the left, the wound is smaller with minimal callus buildup. No significant drainage. 12/16/2021: The right calcaneal wound remains minimally open at the mid calcaneus; the rest has epithelialized. On the left, the wound is also a little bit smaller. There is some senescent tissue on the periphery. He is getting his first application of a  trial skin substitute called Vendaje today. 12/23/2021: The wound on his right calcaneus is nearly closed; there is just a small area at the most distal aspect of the calcaneus that is open. On the left, the area where we applied to the skin substitute has a healthier-looking bed of granulation tissue. The wound dimensions are not significantly different on this side but the wound surface is improved. 12/30/2021: The wound on the right calcaneus has not changed significantly aside from some accumulation of callus. On the left, the open area is smaller and continues to have an improved surface. He continues to accumulate callus around the wound. He is here for his third application of Vendaje. 01/06/2022: The right calcaneal wound is down to just a couple of millimeters. He continues to accumulate periwound callus. He unfortunately got his cast wet earlier in the week and his left foot is macerated, resulting in some superficial skin loss just distal to the open wound. The open wound itself, however, is much smaller and has a healthier appearing surface. He is here for his fourth application of Vendaje. 01/13/2022: The right calcaneal wound is about the same. Unfortunately, once again, his cast got wet and his foot is again macerated. This is caused the left calcaneal wound to enlarge. He is here for his fifth application of Vendaje. 01/20/2022: The right calcaneal wound is very small. There is some periwound callus accumulation. He purchased a new cast protector last week and this has been effective in avoiding the maceration that has been occurring on the left. The left  calcaneal wound is narrower and has a healthy and viable-appearing surface. He is here for his 6 application of Vendaje. 01/27/2022: The right calcaneal wound is down to just a pinhole. There is some periwound slough and callus. On the left, the wound is narrower and shorter by about a centimeter. The surface is robust and viable-appearing.  Unfortunately, the rep for the trial skin substitute product did not provide any for [place] to use today. 02/04/2022: The right calcaneal wound remains unchanged. There is more accumulated callus. On the left, although the intake nurse measured it a little bit longer, it looks about the same to me. The surface has a layer of slough, but underneath this, there is good granulation tissue. 02/10/2022: The right calcaneus wound is nearly closed. There is still some callus that builds up around the site. The left side looks about the same in terms of dimensions, but the surface is more robust and vital-appearing. 02/16/2022: The area of the right calcaneus that was nearly closed last week has closed, but there is a small opening at the mid foot where it looks like some moisture got retained and caused some reopening. The left foot wound is narrower and shallower. Both sites have a fair amount of periwound callus and eschar. 02/24/2022: The small midfoot opening on the right calcaneus is a little bit smaller today. The left foot wound is narrower and shallower. He continues to accumulate periwound callus. No concern for infection. 03/01/2022: The patient came to clinic early because he showered and got his cast wet. Fortunately, there is no significant maceration to his foot but the callus softened and it looks like the wound on his left calcaneus may be a little bit wider. The wound on his right calcaneus is just a narrow slit. Continued accumulation of periwound callus bilaterally. 03/08/2022: The wound on his right calcaneus is very nearly closed, just a small pinpoint opening under a bit of eschar; the left wound has come in quite a bit, as well. It is narrower and shorter than at our last visit. Still with accumulated callus and eschar bilaterally. 03/17/2022: The right calcaneal wound is healed. The left wound is smaller and the surface itself is very clean, but there is some blue-green staining on the periwound  callus, concerning for Pseudomonas aeruginosa. 03/23/2022: The right calcaneal wound remains closed. The left wound continues to contract. No further blue-green staining. Small amount of callus and slough accumulation. 03/28/2022: He came in early today because he had gotten his cast wet. On inspection, the wound itself did not get wet or macerated, just a little bit of the forefoot. The wound itself is basically unchanged. [name], [name] ([id number]) [id number] Page 5 of 16 04/07/2022: The right foot wound remains closed. The left wound is the smallest that I have seen it to date. It is narrower and shorter. It still continues to accumulate slough on the surface. 04/15/2022: There is a band of epithelium now dividing the small left plantar foot wound in 2. There is still some slough on the surface. 04/21/2022: The wound continues to narrow. Just a little bit of slough on the surface. He seems to be responding well to endoform. 04/28/2022: Continued slow contraction of the wound. There is a little slough on the surface and some periwound callus. We have been using endoform and total contact cast. 05/05/2022: The wound appears to have stalled. There is slough and some periwound eschar/callus. No concern for infection, however. 05/12/2022: Unfortunately, his right foot has reopened.  It is located at the most posterior aspect of his surgical incision. The area was noted to have drainage coming from it when his padding was removed today. Underneath some callus and senescent skin, there is an opening. No purulent drainage or malodor. On the left foot, the wound is again unchanged. There is some light blue staining on the callus, but no malodor or purulent drainage. 10/13; right and left heel remanence of extensive plantar foot wounds. These are better than I remember by quite a big margin however he is still left with wounds on the left plantar heel and the right plantar heel. Been  using endoform bilaterally. A culture was done that showed apparently Pseudomonas but we are still waiting for the [facility] antibiotic to use gentamicin today. He is still very active by description I am not sure about the offloading of his noncasted right foot 10/20; both wounds right and left heel debrided not much change from last week. [employer] has arrived which is linezolid, gentamicin and ciprofloxacin we will use this with endoform. T contact cast on the left otal 06/02/2022: Both wounds are smaller today. There is still a fair amount of callus buildup around the right foot ulcer. The left is more superficial and nearly flush with the surrounding tissues. Also with slough and eschar buildup. 06/10/2022: The right sided wound appears to be nearly closed, if not completely so, although it is somewhat difficult to tell given the abnormal tissue and scarring in his foot. There is a fair amount of callus and crust accumulation. On the left, the wound looks about the same, again with callus and slough. He has an appointment next Thursday with Dr. Loel Lofty in podiatry; I am hopeful that there may be some reconstructive options available for Mr. Bonfante. 06/16/2022: Both wounds have some eschar and callus accumulation. The right sided wound is extremely narrow and barely open; the left is narrower than last week. There is a little bit of slough. He has his appointment in podiatry later today. 06/23/2022: The patient met with Dr. Loel Lofty last week and unfortunately, there are no reconstructive options that he believes would be helpful. He did order an MRI to evaluate for osteomyelitis and fortunately, none was seen. The left sided wound is a little bit shorter and narrower today. The right sided wound is about the same. There is callus and eschar accumulation bilaterally. 06/29/2022: Both feet have improved from last week. There is epithelium making a valiant effort to creep across the surface on  the left. The right side looks like it got a little dry and the deep crevasse in his midfoot has cracked. Both have eschar and there is some slough on the left. 07/07/2022: Both feet have improved. There is epithelium completely covering the calcaneus on the right with just a small opening in the crevasse in his midfoot. On the left, the open area of tissue is smaller but he continues to build up callus/eschar and slough. 07/15/2022: The opening in the midfoot on the right is about the same size, covered with eschar and a little bit of slough. The open portion of the left wound is narrower and shorter with a bit of slough buildup. He admits to being on his feet more than recommended. 12/14; as far as I can tell everything on the right foot is closed. There is some eschar I removed some of this I cannot identify any open wound here. As usual this will be a very vulnerable area going forward. On the  left this looks really quite healthy. I was pleasantly surprised to see how good this looked. Wound is certainly smaller and there appears to be healthy epithelialization. He has been using Promogran on the right and endoform on the left. He has been offloading the right foot with a heel offloading boot and he has a running shoe on the right foot 08/04/2022: The right foot remains closed. He has a thick cushioned insole in his sneaker. The left sided wound is smaller with just some slough and eschar accumulation. He is wearing the heel off loader on this foot. 08/15/2022: The right foot remains closed. The left wound has narrowed further. There is some slough and eschar accumulation. 08/25/2022: We put him in a peg assist shoe insert and as a result, he has more epithelialization of the ulcer with minimal slough and eschar accumulation. 09/01/2022: The wound is smaller by about half this week. Still with some slough on the surface. The peg assist shoe seems to be doing a remarkable job of adequately offloading  the site. 09/08/2022: There is a little bit more epithelium coming in. There is some slough and callus buildup. 09/16/2022: The wound measures about the same size, but the epithelium that has grown in looks more robust and stronger. There is some slough and callus buildup. 09/23/2022: The wound remains about the same size. The skin edges are looking rather senescent. Electronic Signature(s) Signed: 09/23/2022 10:53:13 AM By: Fredirick Maudlin MD FACS Entered By: Fredirick Maudlin on 09/23/2022 10:53:13 -------------------------------------------------------------------------------- Physical Exam Details Patient Name: Date of Service: [name], [place] 09/23/2022 10:00 A M Medical Record Number: [id number] Patient Account Number: [id number] Date of Birth/Sex: Treating RN: 1976/05/22 (47 y.o. Marvel Plan ([id number]) [id number] Page 6 of 16 Primary Care Provider: [name] Other Clinician: Referring Provider: Treating Provider/Extender: [name] in Treatment: 114 Constitutional Hypertensive, asymptomatic. . . . no acute distress. Respiratory Normal work of breathing on room air. Notes 09/23/2022: The wound remains about the same size. The skin edges are looking rather senescent. Electronic Signature(s) Signed: 09/23/2022 10:53:43 AM By: Fredirick Maudlin MD FACS Entered By: Fredirick Maudlin on 09/23/2022 10:53:43 -------------------------------------------------------------------------------- Physician Orders Details Patient Name: Date of Service: [name], [name] 09/23/2022 10:00 A M Medical Record Number: [id number] Patient Account Number: [id number] Date of Birth/Sex: Treating RN: [birth year] (47 y.o. [name] Primary Care Provider: [name] Other Clinician: Referring Provider: Treating Provider/Extender: [name] in Treatment: [phone] Verbal / Phone Orders: No Diagnosis Coding ICD-10  Coding Code Description [id number] Non-pressure chronic ulcer of other part of left foot with fat layer exposed Follow-up Appointments ppointment in 1 week. - Dr. [name] Room 2 Return A Anesthetic Wound #2 Left Calcaneus (In clinic) Topical Lidocaine 4% applied to wound bed - In clinic Bathing/ Shower/ Hygiene May shower and wash wound with soap and water. Edema Control - Lymphedema / SCD / Other Bilateral Lower Extremities Avoid standing for long periods of time. Moisturize legs daily. - as needed Off-Loading Other: - keep pressure off of the bottom of your feet. Elevate legs throughout the day. Use the Shoe with the PegAssist off-loading insole Additional Orders / Instructions Follow Nutritious Diet - Try to get 70-100 grams of Protein a day+ Wound Treatment Wound #2 - Calcaneus Wound Laterality: Left Cleanser: Normal Saline (Generic) Every Other Day/30 Days Discharge Instructions: Cleanse the wound with Normal Saline prior to applying a clean dressing using gauze sponges, not tissue or cotton balls.  Cleanser: Wound Cleanser (Generic) Every Other Day/30 Days Discharge Instructions: Cleanse the wound with wound cleanser prior to applying a clean dressing using gauze sponges, not tissue or cotton balls. Prim Dressing: Endoform 2x2 in (Generic) Every Other Day/30 Days ary Discharge Instructions: Moisten with saline or Hydrogel [name], [name] ([id number]) [id number] Page 7 of 16 Secondary Dressing: Optifoam Non-Adhesive Dressing, 4x4 in (Generic) Every Other Day/30 Days Discharge Instructions: Apply over primary dressing as directed. Secondary Dressing: Woven Gauze Sponge, Non-Sterile 4x4 in (Generic) Every Other Day/30 Days Discharge Instructions: Apply over primary dressing as directed. Secured With: 49M Medipore H Soft Cloth Surgical T ape, 4 x 10 (in/yd) (Generic) Every Other Day/30 Days Discharge Instructions: Secure with tape as directed. Compression  Wrap: Kerlix Roll 4.5x3.1 (in/yd) Every Other Day/30 Days Discharge Instructions: Apply Kerlix and Coban compression as directed. Patient Medications llergies: No Known Drug Allergies A Notifications Medication Indication Start End 09/23/2022 lidocaine DOSE topical 4 % cream - cream topical Electronic Signature(s) Signed: 09/23/2022 11:29:12 AM By: Fredirick Maudlin MD FACS Entered By: Fredirick Maudlin on 09/23/2022 10:54:50 -------------------------------------------------------------------------------- Problem List Details Patient Name: Date of Service: [name], [place] 09/23/2022 10:00 A M Medical Record Number: [id number] Patient Account Number: [id number] Date of Birth/Sex: Treating RN: 1976-03-01 (47 y.o. M) Primary Care Provider: [name] Other Clinician: Referring Provider: Treating Provider/Extender: [name] in Treatment: [place] Active Problems ICD-10 Encounter Code Description Active Date MDM Diagnosis L97.522 Non-pressure chronic ulcer of other part of left foot with fat layer exposed 09/03/2020 No Yes Inactive Problems ICD-10 Code Description Active Date Inactive Date L97.512 Non-pressure chronic ulcer of other part of right foot with fat layer exposed 09/03/2020 09/03/2020 L89.893 Pressure ulcer of other site, stage 3 07/15/2020 07/15/2020 M62.81 Muscle weakness (generalized) 07/15/2020 07/15/2020 I10 Essential (primary) hypertension 07/15/2020 07/15/2020 M86.171 Other acute osteomyelitis, right ankle and foot 09/03/2020 09/03/2020 [name], [name] ([id number]) [id number] Page 8 of 16 Resolved Problems Electronic Signature(s) Signed: 09/23/2022 10:52:25 AM By: Fredirick Maudlin MD FACS Entered By: Fredirick Maudlin on 09/23/2022 10:52:25 -------------------------------------------------------------------------------- Progress Note Details Patient Name: Date of Service: [name], [place] 09/23/2022 10:00 A M Medical Record  Number: [id number] Patient Account Number: [id number] Date of Birth/Sex: Treating RN: 1976/04/13 (47 y.o. M) Primary Care Provider: [name] Other Clinician: Referring Provider: Treating Provider/Extender: [name] in Treatment: 114 Subjective Chief Complaint Information obtained from Patient Bilateral Plantar Foot Ulcers History of Present Illness (HPI) Wounds are12/03/2020 upon evaluation today patient presents for initial inspection here in our clinic concerning issues he has been having with the bottoms of his feet bilaterally. He states these actually occurred as wounds when he was hospitalized for 5 months secondary to Covid. He was apparently with tilting bed where he was in an upright position quite frequently and apparently this occurred in some way shape or form during that time. Fortunately there is no sign of active infection at this time. No fevers, chills, nausea, vomiting, or diarrhea. With that being said he still has substantial wounds on the plantar aspects of his feet Theragen require quite a bit of work to get these to heal. He has been using Santyl currently though that is been problematic both in receiving the medication as well as actually paid for it as it is become quite expensive. Prior to the experience with Covid the patient really did not have any major medical problems other than hypertension he does have some mild generalized weakness following the Covid experience.  07/22/2020 on evaluation today patient appears to be doing okay in regard to his foot ulcers I feel like the wound beds are showing signs of better improvement that I do believe the Iodoflex is helping in this regard. With that being said he does have a lot of drainage currently and this is somewhat blue/green in nature which is consistent with Pseudomonas. I do think a culture today would be appropriate for [place] to evaluate and see if that is indeed the case I would likely  start him on antibiotic orally as well he is not allergic to Cipro knows of no issues he has had in the past 12/21; patient was admitted to the clinic earlier this month with bilateral presumed pressure ulcers on the bottom of his feet apparently related to excessive pressure from a tilt table arrangement in the intensive care unit. Patient relates this to being on ECMO but I am not really sure that is exactly related to that. I must say I have never seen anything like this. He has fairly extensive full-thickness wounds extending from his heel towards his midfoot mostly centered laterally. There is already been some healing distally. He does not appear to have an arterial issue. He has been using gentamicin to the wound surfaces with Iodoflex to help with ongoing debridement 1/6; this is a patient with pressure ulcers on the bottom of his feet related to excessive pressure from a standing position in the intensive care unit. He is complaining of a lot of pain in the right heel. He is not a diabetic. 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 We have been using Iodoflex to help prepare the surfaces of both wounds for an advanced treatment product. He is nonambulatory spending most of his time in a wheelchair I have asked him not to propel the wheelchair with his heels 1/13; in general his wounds look better not much surface area change we have been using Iodoflex as of last week. I did an x-ray of the right heel as the patient was complaining of pain. I had some thoughts about a stress fracture perhaps Achilles tendon problems however what it showed was erosive changes along the inferior aspect of the calcaneus he now has a MRI booked for 1/20. 1/20; in general his wounds continue to be better. Some improvement in the large narrow areas proximally in his foot. He is still complaining of pain in the right heel and tenderness in certain areas of this wound. His MRI is tonight. I am  not just looking for osteomyelitis that was brought up on the x-ray I am wondering about stress fractures, tendon ruptures etc. He has no such findings on the left. Also noteworthy is that the patient had critical illness neuropathy and some of the discomfort may be actual improvement in nerve function I am just not sure. These wounds were initially in the setting of severe critical illness related to COVID-19. He was put in a standing position. He may have also been on pressors at the point contributing to tissue ischemia. By his description at some point these wounds were grossly necrotic extending proximally up into the Achilles part of his heel. I do not know that I have ever really seen pictures of them like this although they may exist in epic We have ordered Tri layer Oasis. I am trying to stimulate some granulation in these areas. This is of course assuming the MRI is negative for infection 1/27; since the patient was last  here he saw Dr. Juleen China of infectious disease. He is planned for vancomycin and ceftriaxone. Prior operative culture grew MSSA. Also ordered baseline lab work. He also ordered arterial studies although the ABIs in our clinic were normal as well as his clinical exam these were normal I do not think he needs to see vascular surgery. His ABIs at the PTA were 1.22 in the right triphasic waveforms with a normal TBI of 1.15 on the left ABI of 1.22 with triphasic waveforms and a normal TBI of 1.08. Finally he saw Dr. Amalia Hailey who will follow him in for 2 months. At this point I do not think he felt that he needed a procedure on the right calcaneal bone. Dr. Juleen China is elected for broad-spectrum antibiotic The patient is still having pain in the right heel. He walks with a walker 2/3; wounds are generally smaller. He is tolerating his IV antibiotics. I believe this is vancomycin and ceftriaxone. We are still waiting for Oasis burn in terms of his out-of-pocket max which he should be  meeting soon given the IV antibiotics, MRIs etc. I have asked him to check in on this. We are using silver collagen in the meantime the wounds look better 2/10; tolerating IV vancomycin and Rocephin. We are waiting to apply for Oasis. Although I am not really sure where he is in his out-of-pocket max. 2/17 started the first application of Oasis trilayer. Still on antibiotics. The wounds have generally look better. The area on the left has a little more surface [name], [name] ([id number]) [id number] Page 9 of 16 slough requiring debridement [id number]; second application of Oasis trilayer. The wound surface granulation is generally look better. The area on the left with undermining laterally I think is come in a bit. 10/08/2020 upon evaluation today patient is here today for [employer] application #3. Fortunately he seems to be doing extremely well with regard to this and we are seeing a lot of new epithelial growth which is great news. Fortunately there is no signs of active infection at this time. 10/16/2020 upon evaluation today patient appears to be doing well with regard to his foot ulcers. Do believe the Oasis has been of benefit for him. I do not see any signs of infection right now which is great news and I think that he has a lot of new epithelial growth which is great to see as well. The patient is very pleased to hear all of this. I do think we can proceed with the Oasis trilayer #4 today. 3/18; not as much improvement in these areas on his heels that I was hoping. I did reapply trilateral Oasis today the tissue looks healthier but not as much fill in as I was hoping. 3/25; better looking today I think this is come in a bit the tissue looks healthier. Triple layer Oasis reapplied #6 4/1; somewhat better looking definitely better looking surface not as much change in surface area as I was hoping. He may be spending more time [place] on days then he needs to although he  does have heel offloading boots. Triple layer Oasis reapplied #7 4/7; unfortunately apparently [facility] [facility] will not approve any further Oasis which is unfortunate since the patient did respond nicely both in terms of the condition of the wound bed as well as surface area. There is still some drainage coming from the wound but not a lot there does not appear to be any infection 4/15; we have been using Hydrofera Blue.  He continues to have nice rims of epithelialization on the right greater than the left. The left the epithelialization is coming from the tip of his heel. There is moderate drainage. In this that concerns me about a total contact cast. There is no evidence of infection 4/29; patient has been using Hydrofera Blue with dressing changes. He has no complaints or issues today. 5/5; using Hydrofera Blue. I actually think that he looks marginally better than the last time I saw this 3 weeks ago. There are rims of epithelialization on the left thumb coming from the medial side on the right. Using Hydrofera Blue 5/12; using Hydrofera Blue. These continue to make improvements in surface area. His drainage was not listed as severe I therefore went ahead and put a cast on the left foot. Right foot we will continue to dress his previous 5/16; back for first total contact cast change. He did not tolerate this particularly well cast injury on the anterior tibia among other issues. Difficulty sleeping. I talked him about this in some detail and afterwards is elected to continue. I told him I would like to have a cast on for 3 weeks to see if this is going to help at all. I think he agreed 5/19; I think the wound is better. There is no tunneling towards his midfoot. The undermining medially also looks better. He has a rim of new skin distally. I think we are making progress here. The area on the left also continues to look somewhat better to me using Hydrofera Blue. He has a list of complaints  about the cast but none of them seem serious 5/26; patient presents for 1 week follow-up. He has been using a total contact cast and tolerating this well. Hydrofera Blue is the main dressing used. He denies signs of infection. 6/2 Hydrofera Blue total contact cast on the left. These were large ulcers that formed in intensive care unit where the patient was recovering from [place]. May have had something to do with being ventilated in an upright positiono Pressors etc. We have been able to get the areas down considerably and a viable surface. There is some epithelialization in both sides. Note made of drainage 6/9; changed to [facility] last time because of drainage. He arrives with better looking surfaces and dimensions on the left than the right. Paradoxically the right actually probes more towards his midfoot the left is largely close down but both of these look improved. Using a total contact cast on the left 6/16; complex wounds on his bilateral plantar heels which were initially pressure injury from a stay in the ICU with COVID. We have been using silver alginate most recently. His dimensions of come in quite dramatically however not recently. We have been putting the left foot in a total contact cast 6/23; complex wounds on the bilateral plantar heels. I been putting the left in the cast paradoxically the area on the right is the one that is going towards closure at a faster rate. Quite a bit of drainage on the left. The patient went to see Dr. Amalia Hailey who said he was going to standby for skin grafts. I had actually considered sending him for skin grafts however he would be mandatorily off his feet for a period of weeks to months. I am thinking that the area on the right is going to close on its own the area on the left has been more stubborn even though we have him in a total contact cast  6/30; took him out of a total contact cast last week is the right heel seem to be making better progress  than the left where I was placing the cast. We are using silver alginate. Both wounds are smaller right greater than left 7/12; both wounds look as though they are making some progress. We are using silver alginate. Heel offloading boots 7/26; very gradual progress especially on the right. Using silver alginate. He is wearing heel offloading boots 8/18; he continues to close these wounds down very gradually. Using silver alginate. The problem polymen being definitive about this is areas of what appears to be callus around the margins. This is not a 100% of the area but certainly sizable especially on the right 9/1; bilateral plantar feet wounds secondary to prolonged pressure while being ventilated for COVID-19 in an upright position. Essentially pressure ulcers on the bottom of his feet. He is made substantial progress using silver alginate. 9/14; bilateral plantar feet wounds secondary to prolonged pressure. Making progress using silver alginate. 9/29 bilateral plantar feet wounds secondary to prolonged pressure. I changed him to Iodoflex last week. MolecuLight showing reddened blush fluorescence 10/11; patient presents for follow-up. He has no issues or complaints today. He denies signs of infection. He continues to use Iodoflex and antibiotic ointment to the wound beds. 10/27; 2-week follow-up. No evidence of infection. He has callus and thick dry skin around the wound margins we have been using Iodoflex and Bactroban which was in response to a moderate left MolecuLight reddish blush fluorescence. 11/10; 2-week follow-up. Wound margins again have thick callus however the measurements of the actual wound sites are a lot smaller. Everything looks reasonably healthy here. We have been using Iodoflex He was approved for prime matrix but I have elected to delay this given the improvement in the surface area. Hopefully I will not regret that decision as were getting close to the end of the year in  terms of insurance payment 12/8; 2-week follow-up. Wounds are generally smaller in size. These were initially substantial wounds extending into the forefoot all the way into the heel on the bilateral plantar feet. They are now both located on the plantar heel distal aspect both of these have a lot of callus around the wounds I used a #5 curette to remove this on the right and the left also some subcutaneous debris to try and get the wound edges were using Iodoflex. He has heel offloading shoe 12/22; 2-week follow-up. Not really much improvement. He has thick callus around the outer edges of both wounds. I remove this there is some nonviable subcutaneous tissue as well. We have been using Iodoflex. Her intake nurse and myself spontaneously thought of a total contact cast I went back in May. At that time we really were not seeing much of an improvement with a cast although the wound was in a much different situation I would like to retry this in 2 weeks and I discussed this with the patient 08/12/2021; the patient has had some improvement with the Iodoflex. The the area on the left heel plantar more improved than the right. I had to put him in a total contact cast on the left although I decided to put that off for 2 weeks. I am going to change his primary dressing to silver collagen. I think in both areas he has had some improvement most of the healing seems to be more proximal in the heel. The wounds are in the mid aspect. A lot of  thick callus on the right heel however. 1/19; we are using silver collagen on both plantar heel areas. He has had some improvement today. The left did not require any debridement. He still had some eschar on the right that was debrided but both seem to have contracted. I did not put it total contact cast on him today 2/2 we have been using silver collagen. The area on the right plantar heel has areas that appear to be epithelialized interspersed with dry flaking callus and  dry skin. I removed this. This really looks better than on the other side. On the left still a large area with raised edges and debris on the surface. 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 He has no issues or complaints today. 2/9; not much change in the left foot wound with 1 week of a cast we are using silver collagen. Silver collagen on the right side. The right side has been the better wound surface. We will reapply the total contact cast on the left 2/16; not much improvement on either side I been using silver collagen with a total contact cast on the left. I'm changing the [facility] still with a total contact cast on the left [name], [name] ([id number]) [id number] Page 10 of 16 2/23; some improvement on both sides. Disappointing that he has thick callus around the area that we are putting in a total contact cast on the left. We've been using Hydrofera Blue on both wound areas. This is a man who at essentially pressure ulcers in addition to ischemia caused by medications to support his blood pressure (pressors) in the ICU. He was being ventilated in the standing position for severe Covid. A Shiley the wounds extended across his entire foot but are now localized to his plantar heels bilaterally. We have made progress however neither areas healed. I continue to think the total contact cast is helped albeit painstakingly slowly. He has never wanted a plastic surgery consult although I don't know that they would be interested in grafting in area in this location. 10/07/2021: Continued improvement bilaterally. There is still some callus around the left wound, despite the total contact cast. He has some increased pain in his right midfoot around 1 particular area. This has been painful in the past but seems to be a little bit worse.  When his cast was removed today, there was an area on the heel of the left foot that looks a bit macerated. He is also complaining of pain in his left thigh and hip which he thinks is secondary to the limb length discrepancy caused by the cast. 10/14/2021: He continues to improve. A little bit less callus around the left wound. He continues to endorse pain in his right midfoot, but this is not as significant as it was last week. The maceration on his left heel is improved. 10/21/2021: Continued improvement to both wounds. The maceration on his left heel is no longer evident. Less callus bilaterally. Epithelialization progressing. 10/28/2021: Significant improvement this week. The right sided wound is nearly closed with just a small open area at the middle. No maceration seen on the left heel. Continued epithelialization on both sides. No concern for infection. 11/04/2021: T oday, the wounds were measured a little bit differently and come out as larger, but I actually think they are about the same to potentially even  smaller, particularly on the left. He continues to accumulate some callus on the right. 11/11/2021: T oday, the patient is expressing some concern that the left wound, despite being in the total contact cast, is not progressing at the same rate as the 1 on the right. He is interested in trying a week without the cast to see how the wound does. The wounds are roughly the same size as last week, with the right perhaps being a little bit smaller. He continues to build up callus on both sites. 11/18/2021: Last week, I permitted the patient to go without his total contact cast, just to see if the cast was really making any difference. Today, both wounds have deteriorated to some extent, suggesting that the cast is providing benefit, at least on the left. Both are larger and have accumulated callus, slough, and other debris. 11/26/2021: I debrided both wounds quite aggressively last week in an effort to  stimulate the healing cascade. This appears to have been effective as the left sided wound is a full centimeter shorter in length. Although the right was measured slightly larger, on inspection, it looks as though an area of epithelialized tissue was included in the measurements. We have been using PolyMem Ag on the wound surfaces with a total contact cast to the left. 12/02/2021: It appears that the intake personnel are including epithelialized tissue in his wound measurements; the right wound is almost completely epithelialized; there is just a crater at the proximal midfoot with some open areas. On the left, he has built up some callus, but the overall wound surface looks good. There is some senescent skin around the wound margin. He has been in PolyMem Ag bilaterally with a total contact cast on the left. 12/09/2021: The right wound is nearly closed; there is just a small open area at the mid calcaneus. On the left, the wound is smaller with minimal callus buildup. No significant drainage. 12/16/2021: The right calcaneal wound remains minimally open at the mid calcaneus; the rest has epithelialized. On the left, the wound is also a little bit smaller. There is some senescent tissue on the periphery. He is getting his first application of a trial skin substitute called Vendaje today. 12/23/2021: The wound on his right calcaneus is nearly closed; there is just a small area at the most distal aspect of the calcaneus that is open. On the left, the area where we applied to the skin substitute has a healthier-looking bed of granulation tissue. The wound dimensions are not significantly different on this side but the wound surface is improved. 12/30/2021: The wound on the right calcaneus has not changed significantly aside from some accumulation of callus. On the left, the open area is smaller and continues to have an improved surface. He continues to accumulate callus around the wound. He is here for his third  application of Vendaje. 01/06/2022: The right calcaneal wound is down to just a couple of millimeters. He continues to accumulate periwound callus. He unfortunately got his cast wet earlier in the week and his left foot is macerated, resulting in some superficial skin loss just distal to the open wound. The open wound itself, however, is much smaller and has a healthier appearing surface. He is here for his fourth application of Vendaje. 01/13/2022: The right calcaneal wound is about the same. Unfortunately, once again, his cast got wet and his foot is again macerated. This is caused the left calcaneal wound to enlarge. He is here for his fifth application  of Vendaje. 01/20/2022: The right calcaneal wound is very small. There is some periwound callus accumulation. He purchased a new cast protector last week and this has been effective in avoiding the maceration that has been occurring on the left. The left calcaneal wound is narrower and has a healthy and viable-appearing surface. He is here for his 6 application of Vendaje. 01/27/2022: The right calcaneal wound is down to just a pinhole. There is some periwound slough and callus. On the left, the wound is narrower and shorter by about a centimeter. The surface is robust and viable-appearing. Unfortunately, the rep for the trial skin substitute product did not provide any for [place] to use today. 02/04/2022: The right calcaneal wound remains unchanged. There is more accumulated callus. On the left, although the intake nurse measured it a little bit longer, it looks about the same to me. The surface has a layer of slough, but underneath this, there is good granulation tissue. 02/10/2022: The right calcaneus wound is nearly closed. There is still some callus that builds up around the site. The left side looks about the same in terms of dimensions, but the surface is more robust and vital-appearing. 02/16/2022: The area of the right calcaneus that was nearly closed  last week has closed, but there is a small opening at the mid foot where it looks like some moisture got retained and caused some reopening. The left foot wound is narrower and shallower. Both sites have a fair amount of periwound callus and eschar. 02/24/2022: The small midfoot opening on the right calcaneus is a little bit smaller today. The left foot wound is narrower and shallower. He continues to accumulate periwound callus. No concern for infection. 03/01/2022: The patient came to clinic early because he showered and got his cast wet. Fortunately, there is no significant maceration to his foot but the callus softened and it looks like the wound on his left calcaneus may be a little bit wider. The wound on his right calcaneus is just a narrow slit. Continued accumulation of periwound callus bilaterally. 03/08/2022: The wound on his right calcaneus is very nearly closed, just a small pinpoint opening under a bit of eschar; the left wound has come in quite a bit, as well. It is narrower and shorter than at our last visit. Still with accumulated callus and eschar bilaterally. 03/17/2022: The right calcaneal wound is healed. The left wound is smaller and the surface itself is very clean, but there is some blue-green staining on the periwound callus, concerning for Pseudomonas aeruginosa. 03/23/2022: The right calcaneal wound remains closed. The left wound continues to contract. No further blue-green staining. Small amount of callus and slough accumulation. [name], [name] ([id number]) [id number] Page 11 of 16 03/28/2022: He came in early today because he had gotten his cast wet. On inspection, the wound itself did not get wet or macerated, just a little bit of the forefoot. The wound itself is basically unchanged. 04/07/2022: The right foot wound remains closed. The left wound is the smallest that I have seen it to date. It is narrower and shorter. It still continues  to accumulate slough on the surface. 04/15/2022: There is a band of epithelium now dividing the small left plantar foot wound in 2. There is still some slough on the surface. 04/21/2022: The wound continues to narrow. Just a little bit of slough on the surface. He seems to be responding well to endoform. 04/28/2022: Continued slow contraction of the wound. There is a little slough  on the surface and some periwound callus. We have been using endoform and total contact cast. 05/05/2022: The wound appears to have stalled. There is slough and some periwound eschar/callus. No concern for infection, however. 05/12/2022: Unfortunately, his right foot has reopened. It is located at the most posterior aspect of his surgical incision. The area was noted to have drainage coming from it when his padding was removed today. Underneath some callus and senescent skin, there is an opening. No purulent drainage or malodor. On the left foot, the wound is again unchanged. There is some light blue staining on the callus, but no malodor or purulent drainage. 10/13; right and left heel remanence of extensive plantar foot wounds. These are better than I remember by quite a big margin however he is still left with wounds on the left plantar heel and the right plantar heel. Been using endoform bilaterally. A culture was done that showed apparently Pseudomonas but we are still waiting for the [facility] antibiotic to use gentamicin today. He is still very active by description I am not sure about the offloading of his noncasted right foot 10/20; both wounds right and left heel debrided not much change from last week. [employer] has arrived which is linezolid, gentamicin and ciprofloxacin we will use this with endoform. T contact cast on the left otal 06/02/2022: Both wounds are smaller today. There is still a fair amount of callus buildup around the right foot ulcer. The left is more superficial and nearly flush with the surrounding  tissues. Also with slough and eschar buildup. 06/10/2022: The right sided wound appears to be nearly closed, if not completely so, although it is somewhat difficult to tell given the abnormal tissue and scarring in his foot. There is a fair amount of callus and crust accumulation. On the left, the wound looks about the same, again with callus and slough. He has an appointment next Thursday with Dr. Loel Lofty in podiatry; I am hopeful that there may be some reconstructive options available for Mr. Batterson. 06/16/2022: Both wounds have some eschar and callus accumulation. The right sided wound is extremely narrow and barely open; the left is narrower than last week. There is a little bit of slough. He has his appointment in podiatry later today. 06/23/2022: The patient met with Dr. Loel Lofty last week and unfortunately, there are no reconstructive options that he believes would be helpful. He did order an MRI to evaluate for osteomyelitis and fortunately, none was seen. The left sided wound is a little bit shorter and narrower today. The right sided wound is about the same. There is callus and eschar accumulation bilaterally. 06/29/2022: Both feet have improved from last week. There is epithelium making a valiant effort to creep across the surface on the left. The right side looks like it got a little dry and the deep crevasse in his midfoot has cracked. Both have eschar and there is some slough on the left. 07/07/2022: Both feet have improved. There is epithelium completely covering the calcaneus on the right with just a small opening in the crevasse in his midfoot. On the left, the open area of tissue is smaller but he continues to build up callus/eschar and slough. 07/15/2022: The opening in the midfoot on the right is about the same size, covered with eschar and a little bit of slough. The open portion of the left wound is narrower and shorter with a bit of slough buildup. He admits to being on his  feet more than recommended. 12/14;  as far as I can tell everything on the right foot is closed. There is some eschar I removed some of this I cannot identify any open wound here. As usual this will be a very vulnerable area going forward. On the left this looks really quite healthy. I was pleasantly surprised to see how good this looked. Wound is certainly smaller and there appears to be healthy epithelialization. He has been using Promogran on the right and endoform on the left. He has been offloading the right foot with a heel offloading boot and he has a running shoe on the right foot 08/04/2022: The right foot remains closed. He has a thick cushioned insole in his sneaker. The left sided wound is smaller with just some slough and eschar accumulation. He is wearing the heel off loader on this foot. 08/15/2022: The right foot remains closed. The left wound has narrowed further. There is some slough and eschar accumulation. 08/25/2022: We put him in a peg assist shoe insert and as a result, he has more epithelialization of the ulcer with minimal slough and eschar accumulation. 09/01/2022: The wound is smaller by about half this week. Still with some slough on the surface. The peg assist shoe seems to be doing a remarkable job of adequately offloading the site. 09/08/2022: There is a little bit more epithelium coming in. There is some slough and callus buildup. 09/16/2022: The wound measures about the same size, but the epithelium that has grown in looks more robust and stronger. There is some slough and callus buildup. 09/23/2022: The wound remains about the same size. The skin edges are looking rather senescent. Patient History Information obtained from Patient. Family History Cancer - Maternal Grandparents, Diabetes - Father,Paternal Grandparents, Heart Disease - Maternal Grandparents, Hypertension - Father,Paternal Grandparents, Lung Disease - Siblings, No family history of Hereditary Spherocytosis,  Kidney Disease, Seizures, Stroke, Thyroid Problems, Tuberculosis. Social History Never smoker, Marital Status - Married, Alcohol Use - Never, Drug Use - No History, Caffeine Use - Daily - tea, soda. Medical History Eyes Denies history of Cataracts, Glaucoma, Optic Neuritis Ear/Nose/Mouth/Throat Denies history of Chronic sinus problems/congestion, Middle ear problems Hematologic/Lymphatic [name], [name] ([id number]) [id number] Page 12 of 16 Denies history of Anemia, Hemophilia, Human Immunodeficiency Virus, Lymphedema, Sickle Cell Disease Respiratory Patient has history of Asthma Denies history of Aspiration, Chronic Obstructive Pulmonary Disease (COPD), Pneumothorax, Sleep Apnea, Tuberculosis Cardiovascular Patient has history of Angina - with COVID, Hypertension Denies history of Arrhythmia, Congestive Heart Failure, Coronary Artery Disease, Deep Vein Thrombosis, Hypotension, Myocardial Infarction, Peripheral Arterial Disease, Peripheral Venous Disease, Phlebitis, Vasculitis Gastrointestinal Denies history of Cirrhosis , Colitis, Crohnoos, Hepatitis A, Hepatitis B, Hepatitis C Endocrine Denies history of Type I Diabetes, Type II Diabetes Genitourinary Denies history of End Stage Renal Disease Immunological Denies history of Lupus Erythematosus, Raynaudoos, Scleroderma Integumentary (Skin) Denies history of History of Burn Musculoskeletal Denies history of Gout, Rheumatoid Arthritis, Osteoarthritis, Osteomyelitis Neurologic Denies history of Dementia, Neuropathy, Quadriplegia, Paraplegia, Seizure Disorder Oncologic Denies history of Received Chemotherapy, Received Radiation Psychiatric Denies history of Anorexia/bulimia, Confinement Anxiety Hospitalization/Surgery History - COVID PNA 07/22/2019- 11/14/2019. - 03/27/2020 wound debridement/ skin graft. Medical A Surgical History Notes nd Constitutional Symptoms (General Health) COVID PNA  07/22/2019-11/14/2019 [facility] ECMO, foot drop left foot , Genitourinary kidney stone Psychiatric anxiety Objective Constitutional Hypertensive, asymptomatic. no acute distress. Vitals Time Taken: 10:40 AM, Height: 69 in, Weight: 280 lbs, BMI: 41.3, Temperature: 98 F, Pulse: 82 bpm, Respiratory Rate: 16 breaths/min, Blood Pressure: 152/95 mmHg. Respiratory Normal work  of breathing on room air. General Notes: 09/23/2022: The wound remains about the same size. The skin edges are looking rather senescent. Integumentary (Hair, Skin) Wound #2 status is Open. Original cause of wound was Pressure Injury. The date acquired was: 10/07/2019. The wound has been in treatment 114 weeks. The wound is located on the Left Calcaneus. The wound measures 1.5cm length x 0.5cm width x 0.1cm depth; 0.589cm^2 area and 0.059cm^3 volume. There is Fat Layer (Subcutaneous Tissue) exposed. There is no tunneling or undermining noted. There is a medium amount of serosanguineous drainage noted. The wound margin is thickened. There is large (67-100%) red, pink granulation within the wound bed. There is a small (1-33%) amount of necrotic tissue within the wound bed including Adherent Slough. The periwound skin appearance had no abnormalities noted for moisture. The periwound skin appearance had no abnormalities noted for color. The periwound skin appearance exhibited: Callus, Scarring. Periwound temperature was noted as No Abnormality. Assessment Active Problems ICD-10 Non-pressure chronic ulcer of other part of left foot with fat layer exposed Procedures Wound #2 LINWARD, WEGRZYN ([id number]) [id number] Page 13 of 16 Pre-procedure diagnosis of Wound #2 is a Pressure Ulcer located on the Left Calcaneus . There was a Excisional Skin/Subcutaneous Tissue Debridement with a total area of 0.75 sq cm performed by Fredirick Maudlin, MD. With the following instrument(s): Curette to remove Non-Viable  tissue/material. Material removed includes Subcutaneous Tissue, Slough, and Skin: Epidermis after achieving pain control using Lidocaine 4% T opical Solution. No specimens were taken. A time out was conducted at 10:47, prior to the start of the procedure. A Minimum amount of bleeding was controlled with Pressure. The procedure was tolerated well. Post Debridement Measurements: 1.5cm length x 0.5cm width x 0.1cm depth; 0.059cm^3 volume. Post debridement Stage noted as Category/Stage III. Character of Wound/Ulcer Post Debridement is improved. Post procedure Diagnosis Wound #2: Same as Pre-Procedure General Notes: scribed for Dr. [name] by Adline Peals, RN. Plan Follow-up Appointments: Return Appointment in 1 week. - Dr. [name] Room 2 Anesthetic: Wound #2 Left Calcaneus: (In clinic) Topical Lidocaine 4% applied to wound bed - In clinic Bathing/ Shower/ Hygiene: May shower and wash wound with soap and water. Edema Control - Lymphedema / SCD / Other: Avoid standing for long periods of time. Moisturize legs daily. - as needed Off-Loading: Other: - keep pressure off of the bottom of your feet. Elevate legs throughout the day. Use the Shoe with the PegAssist off-loading insole Additional Orders / Instructions: Follow Nutritious Diet - Try to get 70-100 grams of Protein a day+ The following medication(s) was prescribed: lidocaine topical 4 % cream cream topical was prescribed at facility WOUND #2: - Calcaneus Wound Laterality: Left Cleanser: Normal Saline (Generic) Every Other Day/30 Days Discharge Instructions: Cleanse the wound with Normal Saline prior to applying a clean dressing using gauze sponges, not tissue or cotton balls. Cleanser: Wound Cleanser (Generic) Every Other Day/30 Days Discharge Instructions: Cleanse the wound with wound cleanser prior to applying a clean dressing using gauze sponges, not tissue or cotton balls. Prim Dressing: Endoform 2x2 in (Generic) Every Other Day/30  Days ary Discharge Instructions: Moisten with saline or Hydrogel Secondary Dressing: Optifoam Non-Adhesive Dressing, 4x4 in (Generic) Every Other Day/30 Days Discharge Instructions: Apply over primary dressing as directed. Secondary Dressing: Woven Gauze Sponge, Non-Sterile 4x4 in (Generic) Every Other Day/30 Days Discharge Instructions: Apply over primary dressing as directed. Secured With: 5M Medipore H Soft Cloth Surgical T ape, 4 x 10 (in/yd) (Generic) Every Other Day/30 Days  Discharge Instructions: Secure with tape as directed. Com pression Wrap: Kerlix Roll 4.5x3.1 (in/yd) Every Other Day/30 Days Discharge Instructions: Apply Kerlix and Coban compression as directed. 09/23/2022: The wound remains about the same size. The skin edges are looking rather senescent. I performed a fairly aggressive debridement today, debriding senescent skin, slough, and nonviable subcutaneous tissue. I am hopeful that this will stimulate the healing cascade to kick in more robustly. We will continue [place] topical antibiotic compound with endoform. He will continue to wear the peg assist offloading shoe. Follow-up in 1 week. Electronic Signature(s) Signed: 09/23/2022 10:55:37 AM By: Fredirick Maudlin MD FACS Entered By: Fredirick Maudlin on 09/23/2022 10:55:37 -------------------------------------------------------------------------------- HxROS Details Patient Name: Date of Service: [name], [place] 09/23/2022 10:00 A M Medical Record Number: [id number] Patient Account Number: [id number] Date of Birth/Sex: Treating RN: 1976/04/28 (47 y.o. M) Primary Care Provider: [name] Other Clinician: Referring Provider: Treating Provider/Extender: [name] in Treatment: [place] From Patient [name], [name] ([id number]) [id number] Page 14 of 16 Constitutional Symptoms (General Health) Medical History: Past Medical History Notes: COVID  PNA 07/22/2019-11/14/2019 [facility] ECMO, foot drop left foot , Eyes Medical History: Negative for: Cataracts; Glaucoma; Optic Neuritis Ear/Nose/Mouth/Throat Medical History: Negative for: Chronic sinus problems/congestion; Middle ear problems Hematologic/Lymphatic Medical History: Negative for: Anemia; Hemophilia; Human Immunodeficiency Virus; Lymphedema; Sickle Cell Disease Respiratory Medical History: Positive for: Asthma Negative for: Aspiration; Chronic Obstructive Pulmonary Disease (COPD); Pneumothorax; Sleep Apnea; Tuberculosis Cardiovascular Medical History: Positive for: Angina - with COVID; Hypertension Negative for: Arrhythmia; Congestive Heart Failure; Coronary Artery Disease; Deep Vein Thrombosis; Hypotension; Myocardial Infarction; Peripheral Arterial Disease; Peripheral Venous Disease; Phlebitis; Vasculitis Gastrointestinal Medical History: Negative for: Cirrhosis ; Colitis; Crohns; Hepatitis A; Hepatitis B; Hepatitis C Endocrine Medical History: Negative for: Type I Diabetes; Type II Diabetes Genitourinary Medical History: Negative for: End Stage Renal Disease Past Medical History Notes: kidney stone Immunological Medical History: Negative for: Lupus Erythematosus; Raynauds; Scleroderma Integumentary (Skin) Medical History: Negative for: History of Burn Musculoskeletal Medical History: Negative for: Gout; Rheumatoid Arthritis; Osteoarthritis; Osteomyelitis Neurologic Medical History: Negative for: Dementia; Neuropathy; Quadriplegia; Paraplegia; Seizure Disorder Oncologic Medical History: Negative for: Received Chemotherapy; Received Radiation Psychiatric Medical History: Negative for: Jeri Lager Anxiety [name], [name] ([id number]) [id number] Page 15 of 16 Past Medical History Notes: anxiety Immunizations Pneumococcal Vaccine: Received Pneumococcal Vaccination: No Implantable Devices None Hospitalization  / Surgery History Type of Hospitalization/Surgery COVID PNA 07/22/2019- 11/14/2019 03/27/2020 wound debridement/ skin graft Family and Social History Cancer: Yes - Maternal Grandparents; Diabetes: Yes - Father,Paternal Grandparents; Heart Disease: Yes - Maternal Grandparents; Hereditary Spherocytosis: No; Hypertension: Yes - Father,Paternal Grandparents; Kidney Disease: No; Lung Disease: Yes - Siblings; Seizures: No; Stroke: No; Thyroid Problems: No; Tuberculosis: No; Never smoker; Marital Status - Married; Alcohol Use: Never; Drug Use: No History; Caffeine Use: Daily - tea, soda; Financial Concerns: No; Food, Clothing or Shelter Needs: No; Support System Lacking: No; Transportation Concerns: No Electronic Signature(s) Signed: 09/23/2022 11:29:12 AM By: Fredirick Maudlin MD FACS Entered By: Fredirick Maudlin on 09/23/2022 10:53:20 -------------------------------------------------------------------------------- SuperBill Details Patient Name: Date of Service: [name], [name] 09/23/2022 Medical Record Number: [id number] Patient Account Number: [id number] Date of Birth/Sex: Treating RN: 09-06-1975 (47 y.o. M) Primary Care Provider: [name] Other Clinician: Referring Provider: Treating Provider/Extender: [name] in Treatment: 114 Diagnosis Coding ICD-10 Codes Code Description [id number] Non-pressure chronic ulcer of other part of left foot with fat layer exposed Facility Procedures : [place] Code: [id number] Description: [id number] -  DEB SUBQ TISSUE 20 SQ CM/< ICD-10 Diagnosis Description L97.522 Non-pressure chronic ulcer of other part of left foot with fat layer exposed Modifier: Quantity: 1 Physician Procedures : CPT4 Code Description Modifier [id number] - WC PHYS LEVEL 4 - EST PT 25 ICD-10 Diagnosis Description L97.522 Non-pressure chronic ulcer of other part of left foot with fat layer exposed Quantity: 1 : [id number] - WC PHYS SUBQ TISS 20 SQ CM ICD-10  Diagnosis Description L97.522 Non-pressure chronic ulcer of other part of left foot with fat layer exposed Quantity: 1 Electronic Signature(s) Signed: 09/23/2022 10:55:51 AM By: Fredirick Maudlin MD FACS Entered By: Fredirick Maudlin on 09/23/2022 10:55:51 [name] ([id number]) [id number] Page 16 of 16

## 2022-09-29 ENCOUNTER — Encounter (HOSPITAL_BASED_OUTPATIENT_CLINIC_OR_DEPARTMENT_OTHER): Payer: PPO | Admitting: General Surgery

## 2022-09-29 DIAGNOSIS — L89623 Pressure ulcer of left heel, stage 3: Secondary | ICD-10-CM | POA: Diagnosis not present

## 2022-09-29 DIAGNOSIS — L97522 Non-pressure chronic ulcer of other part of left foot with fat layer exposed: Secondary | ICD-10-CM | POA: Diagnosis not present

## 2022-09-30 NOTE — Progress Notes (Signed)
DAVIER, DOHMEN ([id number]) [id number] Page 1 of 15 Visit Report for 09/29/2022 Chief Complaint Document Details Patient Name: Date of Service: [name], [name] 09/29/2022 10:15 A M Medical Record Number: [id number] Patient Account Number: [id number] Date of Birth/Sex: Treating RN: May 11, 1976 (47 y.o. M) Primary Care Provider: [name] Other Clinician: Referring Provider: Treating Provider/Extender: [name] in Treatment: 115 Information Obtained from: Patient Chief Complaint Bilateral Plantar Foot Ulcers Electronic Signature(s) Signed: 09/29/2022 10:56:04 AM By: Fredirick Maudlin MD FACS Entered By: Fredirick Maudlin on 09/29/2022 10:56:03 -------------------------------------------------------------------------------- Debridement Details Patient Name: Date of Service: [name], [name] 09/29/2022 10:15 A M Medical Record Number: [id number] Patient Account Number: [id number] Date of Birth/Sex: Treating RN: 1975/10/26 (47 y.o. [name] Primary Care Provider: [name] Other Clinician: Referring Provider: Treating Provider/Extender: [name] in Treatment: 115 Debridement Performed for Assessment: Wound #2 Left Calcaneus Performed By: Physician Fredirick Maudlin, MD Debridement Type: Debridement Level of Consciousness (Pre-procedure): Awake and Alert Pre-procedure Verification/Time Out Yes - 10:42 Taken: Start Time: 10:42 Pain Control: Lidocaine 5% topical ointment T Area Debrided (L x W): otal 0.9 (cm) x 0.4 (cm) = 0.36 (cm) Tissue and other material debrided: Eschar, Slough, Slough Level: Non-Viable Tissue Debridement Description: Selective/Open Wound Instrument: Curette Bleeding: Minimum Hemostasis Achieved: Pressure End Time: 10:43 Procedural Pain: 0 Post Procedural Pain: 0 Response to Treatment: Procedure was tolerated well Level of Consciousness (Post- Awake and  Alert procedure): Post Debridement Measurements of Total Wound Length: (cm) 0.9 Stage: Category/Stage III Width: (cm) 0.4 Depth: (cm) 0.1 Volume: (cm) 0.028 Character of Wound/Ulcer Post Debridement: Improved Post Procedure Diagnosis [name], [name] ([id number]) [id number] Page 2 of 15 Same as Pre-procedure Notes Scribed for Dr. [name] by J.Scotton Electronic Signature(s) Signed: 09/29/2022 11:09:37 AM By: Fredirick Maudlin MD FACS Signed: 09/29/2022 5:20:32 PM By: [name] Entered By: [name] on 09/29/2022 10:51:21 -------------------------------------------------------------------------------- HPI Details Patient Name: Date of Service: [name], [name] 09/29/2022 10:15 A M Medical Record Number: [id number] Patient Account Number: [id number] Date of Birth/Sex: Treating RN: 1976-04-14 (47 y.o. M) Primary Care Provider: [name] Other Clinician: Referring Provider: Treating Provider/Extender: [name] in Treatment: 115 History of Present Illness HPI Description: Wounds are12/03/2020 upon evaluation today patient presents for initial inspection here in our clinic concerning issues he has been having with the bottoms of his feet bilaterally. He states these actually occurred as wounds when he was hospitalized for 5 months secondary to Covid. He was apparently with tilting bed where he was in an upright position quite frequently and apparently this occurred in some way shape or form during that time. Fortunately there is no sign of active infection at this time. No fevers, chills, nausea, vomiting, or diarrhea. With that being said he still has substantial wounds on the plantar aspects of his feet Theragen require quite a bit of work to get these to heal. He has been using Santyl currently though that is been problematic both in receiving the medication as well as actually paid for it as it is become quite  expensive. Prior to the experience with Covid the patient really did not have any major medical problems other than hypertension he does have some mild generalized weakness following the Covid experience. 07/22/2020 on evaluation today patient appears to be doing okay in regard to his foot ulcers I feel like the wound beds are showing signs of better improvement that I do believe the Iodoflex is  helping in this regard. With that being said he does have a lot of drainage currently and this is somewhat blue/green in nature which is consistent with Pseudomonas. I do think a culture today would be appropriate for [place] to evaluate and see if that is indeed the case I would likely start him on antibiotic orally as well he is not allergic to Cipro knows of no issues he has had in the past 12/21; patient was admitted to the clinic earlier this month with bilateral presumed pressure ulcers on the bottom of his feet apparently related to excessive pressure from a tilt table arrangement in the intensive care unit. Patient relates this to being on ECMO but I am not really sure that is exactly related to that. I must say I have never seen anything like this. He has fairly extensive full-thickness wounds extending from his heel towards his midfoot mostly centered laterally. There is already been some healing distally. He does not appear to have an arterial issue. He has been using gentamicin to the wound surfaces with Iodoflex to help with ongoing debridement 1/6; this is a patient with pressure ulcers on the bottom of his feet related to excessive pressure from a standing position in the intensive care unit. He is complaining of a lot of pain in the right heel. He is not a diabetic. 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 We have been using Iodoflex to help prepare the surfaces of both wounds for an advanced treatment product. He is nonambulatory spending most of his time in a wheelchair  I have asked him not to propel the wheelchair with his heels 1/13; in general his wounds look better not much surface area change we have been using Iodoflex as of last week. I did an x-ray of the right heel as the patient was complaining of pain. I had some thoughts about a stress fracture perhaps Achilles tendon problems however what it showed was erosive changes along the inferior aspect of the calcaneus he now has a MRI booked for 1/20. 1/20; in general his wounds continue to be better. Some improvement in the large narrow areas proximally in his foot. He is still complaining of pain in the right heel and tenderness in certain areas of this wound. His MRI is tonight. I am not just looking for osteomyelitis that was brought up on the x-ray I am wondering about stress fractures, tendon ruptures etc. He has no such findings on the left. Also noteworthy is that the patient had critical illness neuropathy and some of the discomfort may be actual improvement in nerve function I am just not sure. These wounds were initially in the setting of severe critical illness related to COVID-19. He was put in a standing position. He may have also been on pressors at the point contributing to tissue ischemia. By his description at some point these wounds were grossly necrotic extending proximally up into the Achilles part of his heel. I do not know that I have ever really seen pictures of them like this although they may exist in epic We have ordered Tri layer Oasis. I am trying to stimulate some granulation in these areas. This is of course assuming the MRI is negative for infection 1/27; since the patient was last here he saw Dr. Juleen China of infectious disease. He is planned for vancomycin and ceftriaxone. Prior operative culture grew MSSA. Also ordered baseline lab work. He also ordered arterial studies although the ABIs in our clinic  were normal as well as his clinical exam these were normal I do not think he  needs to see vascular surgery. His ABIs at the [facility] were 1.22 in the right triphasic waveforms with a normal TBI of 1.15 on the left ABI of 1.22 with triphasic waveforms and a normal TBI of 1.08. Finally he saw Dr. Amalia Hailey who will follow him in for 2 months. At this point I do not think he felt that he needed a procedure on the right calcaneal bone. Dr. Juleen China is elected for broad-spectrum antibiotic The patient is still having pain in the right heel. He walks with a walker 2/3; wounds are generally smaller. He is tolerating his IV antibiotics. I believe this is vancomycin and ceftriaxone. We are still waiting for Oasis burn in terms of his out-of-pocket [name] which he should be meeting soon given the IV antibiotics, MRIs etc. I have asked him to check in on this. We are using silver collagen in the meantime the wounds look better 2/10; tolerating IV vancomycin and Rocephin. We are waiting to apply for Oasis. Although I am not really sure where he is in his out-of-pocket [name] 2/17 started the first application of Oasis trilayer. Still on antibiotics. The wounds have generally look better. The area on the left has a little more surface [name], [name] ([id number]) [phone].pdf Page 3 of 15 slough requiring debridement [id number]; second application of Oasis trilayer. The wound surface granulation is generally look better. The area on the left with undermining laterally I think is come in a bit. 10/08/2020 upon evaluation today patient is here today for [employer] application #3. Fortunately he seems to be doing extremely well with regard to this and we are seeing a lot of new epithelial growth which is great news. Fortunately there is no signs of active infection at this time. 10/16/2020 upon evaluation today patient appears to be doing well with regard to his foot ulcers. Do believe the Oasis has been of benefit for him. I do not see any signs of infection right now which is great  news and I think that he has a lot of new epithelial growth which is great to see as well. The patient is very pleased to hear all of this. I do think we can proceed with the Oasis trilayer #4 today. 3/18; not as much improvement in these areas on his heels that I was hoping. I did reapply trilateral Oasis today the tissue looks healthier but not as much fill in as I was hoping. 3/25; better looking today I think this is come in a bit the tissue looks healthier. Triple layer Oasis reapplied #6 4/1; somewhat better looking definitely better looking surface not as much change in surface area as I was hoping. He may be spending more time [place] on days then he needs to although he does have heel offloading boots. Triple layer Oasis reapplied #7 4/7; unfortunately apparently [facility] will not approve any further Oasis which is unfortunate since the patient did respond nicely both in terms of the condition of the wound bed as well as surface area. There is still some drainage coming from the wound but not a lot there does not appear to be any infection 4/15; we have been using Hydrofera Blue. He continues to have nice rims of epithelialization on the right greater than the left. The left the epithelialization is coming from the tip of his heel. There is moderate drainage. In this that concerns me  about a total contact cast. There is no evidence of infection 4/29; patient has been using Hydrofera Blue with dressing changes. He has no complaints or issues today. 5/5; using Hydrofera Blue. I actually think that he looks marginally better than the last time I saw this 3 weeks ago. There are rims of epithelialization on the left thumb coming from the medial side on the right. Using Hydrofera Blue 5/12; using Hydrofera Blue. These continue to make improvements in surface area. His drainage was not listed as severe I therefore went ahead and put a cast on the left foot. Right foot we will continue  to dress his previous 5/16; back for first total contact cast change. He did not tolerate this particularly well cast injury on the anterior tibia among other issues. Difficulty sleeping. I talked him about this in some detail and afterwards is elected to continue. I told him I would like to have a cast on for 3 weeks to see if this is going to help at all. I think he agreed 5/19; I think the wound is better. There is no tunneling towards his midfoot. The undermining medially also looks better. He has a rim of new skin distally. I think we are making progress here. The area on the left also continues to look somewhat better to me using Hydrofera Blue. He has a list of complaints about the cast but none of them seem serious 5/26; patient presents for 1 week follow-up. He has been using a total contact cast and tolerating this well. Hydrofera Blue is the main dressing used. He denies signs of infection. 6/2 Hydrofera Blue total contact cast on the left. These were large ulcers that formed in intensive care unit where the patient was recovering from [place]. May have had something to do with being ventilated in an upright positiono Pressors etc. We have been able to get the areas down considerably and a viable surface. There is some epithelialization in both sides. Note made of drainage 6/9; changed to [facility] last time because of drainage. He arrives with better looking surfaces and dimensions on the left than the right. Paradoxically the right actually probes more towards his midfoot the left is largely close down but both of these look improved. Using a total contact cast on the left 6/16; complex wounds on his bilateral plantar heels which were initially pressure injury from a stay in the ICU with COVID. We have been using silver alginate most recently. His dimensions of come in quite dramatically however not recently. We have been putting the left foot in a total contact cast 6/23; complex  wounds on the bilateral plantar heels. I been putting the left in the cast paradoxically the area on the right is the one that is going towards closure at a faster rate. Quite a bit of drainage on the left. The patient went to see Dr. Amalia Hailey who said he was going to standby for skin grafts. I had actually considered sending him for skin grafts however he would be mandatorily off his feet for a period of weeks to months. I am thinking that the area on the right is going to close on its own the area on the left has been more stubborn even though we have him in a total contact cast 6/30; took him out of a total contact cast last week is the right heel seem to be making better progress than the left where I was placing the cast. We are using silver alginate.  Both wounds are smaller right greater than left 7/12; both wounds look as though they are making some progress. We are using silver alginate. Heel offloading boots 7/26; very gradual progress especially on the right. Using silver alginate. He is wearing heel offloading boots 8/18; he continues to close these wounds down very gradually. Using silver alginate. The problem polymen being definitive about this is areas of what appears to be callus around the margins. This is not a 100% of the area but certainly sizable especially on the right 9/1; bilateral plantar feet wounds secondary to prolonged pressure while being ventilated for COVID-19 in an upright position. Essentially pressure ulcers on the bottom of his feet. He is made substantial progress using silver alginate. 9/14; bilateral plantar feet wounds secondary to prolonged pressure. Making progress using silver alginate. 9/29 bilateral plantar feet wounds secondary to prolonged pressure. I changed him to Iodoflex last week. MolecuLight showing reddened blush fluorescence 10/11; patient presents for follow-up. He has no issues or complaints today. He denies signs of infection. He continues to use  Iodoflex and antibiotic ointment to the wound beds. 10/27; 2-week follow-up. No evidence of infection. He has callus and thick dry skin around the wound margins we have been using Iodoflex and Bactroban which was in response to a moderate left MolecuLight reddish blush fluorescence. 11/10; 2-week follow-up. Wound margins again have thick callus however the measurements of the actual wound sites are a lot smaller. Everything looks reasonably healthy here. We have been using Iodoflex He was approved for prime matrix but I have elected to delay this given the improvement in the surface area. Hopefully I will not regret that decision as were getting close to the end of the year in terms of insurance payment 12/8; 2-week follow-up. Wounds are generally smaller in size. These were initially substantial wounds extending into the forefoot all the way into the heel on the bilateral plantar feet. They are now both located on the plantar heel distal aspect both of these have a lot of callus around the wounds I used a #5 curette to remove this on the right and the left also some subcutaneous debris to try and get the wound edges were using Iodoflex. He has heel offloading shoe 12/22; 2-week follow-up. Not really much improvement. He has thick callus around the outer edges of both wounds. I remove this there is some nonviable subcutaneous tissue as well. We have been using Iodoflex. Her intake nurse and myself spontaneously thought of a total contact cast I went back in May. At that time we really were not seeing much of an improvement with a cast although the wound was in a much different situation I would like to retry this in 2 weeks and I discussed this with the patient 08/12/2021; the patient has had some improvement with the Iodoflex. The the area on the left heel plantar more improved than the right. I had to put him in a total contact cast on the left although I decided to put that off for 2 weeks. I am  going to change his primary dressing to silver collagen. I think in both areas he has had some improvement most of the healing seems to be more proximal in the heel. The wounds are in the mid aspect. A lot of thick callus on the right heel however. 1/19; we are using silver collagen on both plantar heel areas. He has had some improvement today. The left did not require any debridement. He still had some  eschar on the right that was debrided but both seem to have contracted. I did not put it total contact cast on him today 2/2 we have been using silver collagen. The area on the right plantar heel has areas that appear to be epithelialized interspersed with dry flaking callus and dry skin. I removed this. This really looks better than on the other side. On the left still a large area with raised edges and debris on the surface. 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 He has no issues or complaints today. 2/9; not much change in the left foot wound with 1 week of a cast we are using silver collagen. Silver collagen on the right side. The right side has been the better wound surface. We will reapply the total contact cast on the left 2/16; not much improvement on either side I been using silver collagen with a total contact cast on the left. I'm changing the [facility] still with a total contact cast on the left ZILDJIAN, GANDIA ([id number]) [id number].pdf Page 4 of 15 2/23; some improvement on both sides. Disappointing that he has thick callus around the area that we are putting in a total contact cast on the left. We've been using Hydrofera Blue on both wound areas. This is a man who at essentially pressure ulcers in addition to ischemia caused by medications to support his blood pressure (pressors) in the ICU. He was being ventilated in the  standing position for severe Covid. A Shiley the wounds extended across his entire foot but are now localized to his plantar heels bilaterally. We have made progress however neither areas healed. I continue to think the total contact cast is helped albeit painstakingly slowly. He has never wanted a plastic surgery consult although I don't know that they would be interested in grafting in area in this location. 10/07/2021: Continued improvement bilaterally. There is still some callus around the left wound, despite the total contact cast. He has some increased pain in his right midfoot around 1 particular area. This has been painful in the past but seems to be a little bit worse. When his cast was removed today, there was an area on the heel of the left foot that looks a bit macerated. He is also complaining of pain in his left thigh and hip which he thinks is secondary to the limb length discrepancy caused by the cast. 10/14/2021: He continues to improve. A little bit less callus around the left wound. He continues to endorse pain in his right midfoot, but this is not as significant as it was last week. The maceration on his left heel is improved. 10/21/2021: Continued improvement to both wounds. The maceration on his left heel is no longer evident. Less callus bilaterally. Epithelialization progressing. 10/28/2021: Significant improvement this week. The right sided wound is nearly closed with just a small open area at the middle. No maceration seen on the left heel. Continued epithelialization on both sides. No concern for infection. 11/04/2021: T oday, the wounds were measured a little bit differently and come out as larger, but I actually think they are about the same to potentially even smaller, particularly on the left. He continues to accumulate some callus on the right. 11/11/2021: T oday, the patient is expressing some concern that the left wound, despite being in the total contact cast, is not  progressing at the same rate as the 1 on the right. He is interested in trying a week without the cast to see how the wound does. The wounds are roughly the same size as last week, with the right perhaps being a little bit smaller. He continues to build up callus on both sites. 11/18/2021: Last week, I permitted the patient to go without his total contact cast, just to see if the cast was really making any difference. Today, both wounds have deteriorated to some extent, suggesting that the cast is providing benefit, at least on the left. Both are larger and have accumulated callus, slough, and other debris. 11/26/2021: I debrided both wounds quite aggressively last week in an effort to stimulate the healing cascade. This appears to have been effective as the left sided wound is a full centimeter shorter in length. Although the right was measured slightly larger, on inspection, it looks as though an area of epithelialized tissue was included in the measurements. We have been using PolyMem Ag on the wound surfaces with a total contact cast to the left. 12/02/2021: It appears that the intake personnel are including epithelialized tissue in his wound measurements; the right wound is almost completely epithelialized; there is just a crater at the proximal midfoot with some open areas. On the left, he has built up some callus, but the overall wound surface looks good. There is some senescent skin around the wound margin. He has been in PolyMem Ag bilaterally with a total contact cast on the left. 12/09/2021: The right wound is nearly closed; there is just a small open area at the mid calcaneus. On the left, the wound is smaller with minimal callus buildup. No significant drainage. 12/16/2021: The right calcaneal wound remains minimally open at the mid calcaneus; the rest has epithelialized. On the left, the wound is also a little bit smaller. There is some senescent tissue on the periphery. He is getting his  first application of a trial skin substitute called Vendaje today. 12/23/2021: The wound on his right calcaneus is nearly closed; there is just a small area at the most distal aspect of the calcaneus that is open. On the left, the area where we applied to the skin substitute has a healthier-looking bed of granulation tissue. The wound dimensions are not significantly different on this side but the wound surface is improved. 12/30/2021: The wound on the right calcaneus has not changed significantly aside from some accumulation of callus. On the left, the open area is smaller and continues to have an improved surface. He continues to accumulate callus around the wound. He is here for his third application of Vendaje. 01/06/2022: The right calcaneal wound is down to just a couple of millimeters. He continues to accumulate periwound callus. He unfortunately got his cast wet earlier in the week and his left foot is macerated, resulting in some superficial skin loss just distal to the open wound. The open wound itself, however, is much smaller and has a healthier appearing surface. He is here for his fourth application of Vendaje. 01/13/2022: The right calcaneal wound is about the same. Unfortunately, once again, his cast got wet and his foot is again macerated. This is caused the left calcaneal wound to enlarge. He is here for his fifth application of Vendaje. 01/20/2022: The right calcaneal wound is very small. There is some periwound callus accumulation. He purchased a new cast protector last week and this has been effective in avoiding the maceration that has been occurring  on the left. The left calcaneal wound is narrower and has a healthy and viable-appearing surface. He is here for his 6 application of Vendaje. 01/27/2022: The right calcaneal wound is down to just a pinhole. There is some periwound slough and callus. On the left, the wound is narrower and shorter by about a centimeter. The surface is robust  and viable-appearing. Unfortunately, the rep for the trial skin substitute product did not provide any for [place] to use today. 02/04/2022: The right calcaneal wound remains unchanged. There is more accumulated callus. On the left, although the intake nurse measured it a little bit longer, it looks about the same to me. The surface has a layer of slough, but underneath this, there is good granulation tissue. 02/10/2022: The right calcaneus wound is nearly closed. There is still some callus that builds up around the site. The left side looks about the same in terms of dimensions, but the surface is more robust and vital-appearing. 02/16/2022: The area of the right calcaneus that was nearly closed last week has closed, but there is a small opening at the mid foot where it looks like some moisture got retained and caused some reopening. The left foot wound is narrower and shallower. Both sites have a fair amount of periwound callus and eschar. 02/24/2022: The small midfoot opening on the right calcaneus is a little bit smaller today. The left foot wound is narrower and shallower. He continues to accumulate periwound callus. No concern for infection. 03/01/2022: The patient came to clinic early because he showered and got his cast wet. Fortunately, there is no significant maceration to his foot but the callus softened and it looks like the wound on his left calcaneus may be a little bit wider. The wound on his right calcaneus is just a narrow slit. Continued accumulation of periwound callus bilaterally. 03/08/2022: The wound on his right calcaneus is very nearly closed, just a small pinpoint opening under a bit of eschar; the left wound has come in quite a bit, as well. It is narrower and shorter than at our last visit. Still with accumulated callus and eschar bilaterally. 03/17/2022: The right calcaneal wound is healed. The left wound is smaller and the surface itself is very clean, but there is some blue-green  staining on the periwound callus, concerning for Pseudomonas aeruginosa. 03/23/2022: The right calcaneal wound remains closed. The left wound continues to contract. No further blue-green staining. Small amount of callus and slough accumulation. [name], [name] ([id number]) [id number] Page 5 of 15 03/28/2022: He came in early today because he had gotten his cast wet. On inspection, the wound itself did not get wet or macerated, just a little bit of the forefoot. The wound itself is basically unchanged. 04/07/2022: The right foot wound remains closed. The left wound is the smallest that I have seen it to date. It is narrower and shorter. It still continues to accumulate slough on the surface. 04/15/2022: There is a band of epithelium now dividing the small left plantar foot wound in 2. There is still some slough on the surface. 04/21/2022: The wound continues to narrow. Just a little bit of slough on the surface. He seems to be responding well to endoform. 04/28/2022: Continued slow contraction of the wound. There is a little slough on the surface and some periwound callus. We have been using endoform and total contact cast. 05/05/2022: The wound appears to have stalled. There is slough and some periwound eschar/callus. No concern for infection, however. 05/12/2022: Unfortunately,  his right foot has reopened. It is located at the most posterior aspect of his surgical incision. The area was noted to have drainage coming from it when his padding was removed today. Underneath some callus and senescent skin, there is an opening. No purulent drainage or malodor. On the left foot, the wound is again unchanged. There is some light blue staining on the callus, but no malodor or purulent drainage. 10/13; right and left heel remanence of extensive plantar foot wounds. These are better than I remember by quite a big margin however he is still left with wounds on the left plantar heel and the  right plantar heel. Been using endoform bilaterally. A culture was done that showed apparently Pseudomonas but we are still waiting for the [facility] antibiotic to use gentamicin today. He is still very active by description I am not sure about the offloading of his noncasted right foot 10/20; both wounds right and left heel debrided not much change from last week. [employer] has arrived which is linezolid, gentamicin and ciprofloxacin we will use this with endoform. T contact cast on the left otal 06/02/2022: Both wounds are smaller today. There is still a fair amount of callus buildup around the right foot ulcer. The left is more superficial and nearly flush with the surrounding tissues. Also with slough and eschar buildup. 06/10/2022: The right sided wound appears to be nearly closed, if not completely so, although it is somewhat difficult to tell given the abnormal tissue and scarring in his foot. There is a fair amount of callus and crust accumulation. On the left, the wound looks about the same, again with callus and slough. He has an appointment next Thursday with Dr. Loel Lofty in podiatry; I am hopeful that there may be some reconstructive options available for Mr. Klutz. 06/16/2022: Both wounds have some eschar and callus accumulation. The right sided wound is extremely narrow and barely open; the left is narrower than last week. There is a little bit of slough. He has his appointment in podiatry later today. 06/23/2022: The patient met with Dr. Loel Lofty last week and unfortunately, there are no reconstructive options that he believes would be helpful. He did order an MRI to evaluate for osteomyelitis and fortunately, none was seen. The left sided wound is a little bit shorter and narrower today. The right sided wound is about the same. There is callus and eschar accumulation bilaterally. 06/29/2022: Both feet have improved from last week. There is epithelium making a valiant effort to creep  across the surface on the left. The right side looks like it got a little dry and the deep crevasse in his midfoot has cracked. Both have eschar and there is some slough on the left. 07/07/2022: Both feet have improved. There is epithelium completely covering the calcaneus on the right with just a small opening in the crevasse in his midfoot. On the left, the open area of tissue is smaller but he continues to build up callus/eschar and slough. 07/15/2022: The opening in the midfoot on the right is about the same size, covered with eschar and a little bit of slough. The open portion of the left wound is narrower and shorter with a bit of slough buildup. He admits to being on his feet more than recommended. 12/14; as far as I can tell everything on the right foot is closed. There is some eschar I removed some of this I cannot identify any open wound here. As usual this will be a very vulnerable  area going forward. On the left this looks really quite healthy. I was pleasantly surprised to see how good this looked. Wound is certainly smaller and there appears to be healthy epithelialization. He has been using Promogran on the right and endoform on the left. He has been offloading the right foot with a heel offloading boot and he has a running shoe on the right foot 08/04/2022: The right foot remains closed. He has a thick cushioned insole in his sneaker. The left sided wound is smaller with just some slough and eschar accumulation. He is wearing the heel off loader on this foot. 08/15/2022: The right foot remains closed. The left wound has narrowed further. There is some slough and eschar accumulation. 08/25/2022: We put him in a peg assist shoe insert and as a result, he has more epithelialization of the ulcer with minimal slough and eschar accumulation. 09/01/2022: The wound is smaller by about half this week. Still with some slough on the surface. The peg assist shoe seems to be doing a remarkable job  of adequately offloading the site. 09/08/2022: There is a little bit more epithelium coming in. There is some slough and callus buildup. 09/16/2022: The wound measures about the same size, but the epithelium that has grown in looks more robust and stronger. There is some slough and callus buildup. 09/23/2022: The wound remains about the same size. The skin edges are looking rather senescent. 09/29/2022: The aggressive debridement I performed last week seems to have been effective. The wound is smaller and has significantly less slough accumulation. Electronic Signature(s) Signed: 09/29/2022 10:56:37 AM By: Fredirick Maudlin MD FACS Entered By: Fredirick Maudlin on 09/29/2022 10:56:37 [name] ([id number]) [id number] Page 6 of 15 -------------------------------------------------------------------------------- Physical Exam Details Patient Name: Date of Service: [name] [place] 09/29/2022 10:15 A M Medical Record Number: [id number] Patient Account Number: [id number] Date of Birth/Sex: Treating RN: 1976/04/21 (47 y.o. M) Primary Care Provider: [name] Other Clinician: Referring Provider: Treating Provider/Extender: [name] in Treatment: 115 Constitutional . . . . no acute distress. Respiratory Normal work of breathing on room air. Notes 09/29/2022: The aggressive debridement I performed last week seems to have been effective. The wound is smaller and has significantly less slough accumulation. Electronic Signature(s) Signed: 09/29/2022 10:57:44 AM By: Fredirick Maudlin MD FACS Entered By: Fredirick Maudlin on 09/29/2022 10:57:44 -------------------------------------------------------------------------------- Physician Orders Details Patient Name: Date of Service: [name], [place] 09/29/2022 10:15 A M Medical Record Number: [id number] Patient Account Number: [id number] Date of Birth/Sex: Treating RN: 20-Mar-1976 (47 y.o. [name] Primary Care Provider: [name] Other Clinician: Referring Provider: Treating Provider/Extender: [name] in Treatment: [phone] Verbal / Phone Orders: No Diagnosis Coding ICD-10 Coding Code Description [phone] Non-pressure chronic ulcer of other part of left foot with fat layer exposed Follow-up Appointments ppointment in 1 week. - Dr. [name] Room 2 Return A Anesthetic Wound #2 Left Calcaneus (In clinic) Topical Lidocaine 4% applied to wound bed - In clinic Bathing/ Shower/ Hygiene May shower and wash wound with soap and water. Edema Control - Lymphedema / SCD / Other Bilateral Lower Extremities Avoid standing for long periods of time. Moisturize legs daily. - as needed Off-Loading Other: - keep pressure off of the bottom of your feet. Elevate legs throughout the day. Use the Shoe with the PegAssist off-loading insole Additional Orders / Instructions Follow Nutritious Diet - Try to get 70-100 grams of Protein a day+ Wound Treatment Wound #2 -  Calcaneus Wound Laterality: Left Cleanser: Normal Saline (Generic) Every Other Day/30 Days Discharge Instructions: Cleanse the wound with Normal Saline prior to applying a clean dressing using gauze sponges, not tissue or cotton balls. TRACEN, DUNKERLEY ([id number]) [id number] Page 7 of 15 Cleanser: Wound Cleanser (Generic) Every Other Day/30 Days Discharge Instructions: Cleanse the wound with wound cleanser prior to applying a clean dressing using gauze sponges, not tissue or cotton balls. Prim Dressing: Endoform 2x2 in (Generic) Every Other Day/30 Days ary Discharge Instructions: Moisten with saline or Hydrogel Secondary Dressing: Optifoam Non-Adhesive Dressing, 4x4 in (Generic) Every Other Day/30 Days Discharge Instructions: Apply over primary dressing as directed. Secondary Dressing: Woven Gauze Sponge, Non-Sterile 4x4 in (Generic) Every Other Day/30 Days Discharge  Instructions: Apply over primary dressing as directed. Secured With: 43M Medipore H Soft Cloth Surgical T ape, 4 x 10 (in/yd) (Generic) Every Other Day/30 Days Discharge Instructions: Secure with tape as directed. Compression Wrap: Kerlix Roll 4.5x3.1 (in/yd) Every Other Day/30 Days Discharge Instructions: Apply Kerlix and Coban compression as directed. Electronic Signature(s) Signed: 09/29/2022 11:09:37 AM By: Fredirick Maudlin MD FACS Entered By: Fredirick Maudlin on 09/29/2022 10:58:01 -------------------------------------------------------------------------------- Problem List Details Patient Name: Date of Service: [name], [place] 09/29/2022 10:15 A M Medical Record Number: [id number] Patient Account Number: [id number] Date of Birth/Sex: Treating RN: 12-05-1975 (47 y.o. M) Primary Care Provider: [name] Other Clinician: Referring Provider: Treating Provider/Extender: [name] in Treatment: 115 Active Problems ICD-10 Encounter Code Description Active Date MDM Diagnosis L97.522 Non-pressure chronic ulcer of other part of left foot with fat layer exposed 09/03/2020 No Yes Inactive Problems ICD-10 Code Description Active Date Inactive Date L97.512 Non-pressure chronic ulcer of other part of right foot with fat layer exposed 09/03/2020 09/03/2020 L89.893 Pressure ulcer of other site, stage 3 07/15/2020 07/15/2020 M62.81 Muscle weakness (generalized) 07/15/2020 07/15/2020 I10 Essential (primary) hypertension 07/15/2020 07/15/2020 M86.171 Other acute osteomyelitis, right ankle and foot 09/03/2020 09/03/2020 Resolved Problems BAXLEY, HEMP ([id number]) [phone].pdf Page 8 of 15 Electronic Signature(s) Signed: 09/29/2022 10:54:38 AM By: Fredirick Maudlin MD FACS Entered By: Fredirick Maudlin on 09/29/2022 10:54:38 -------------------------------------------------------------------------------- Progress Note Details Patient Name: Date of  Service: [name], [place] 09/29/2022 10:15 A M Medical Record Number: [id number] Patient Account Number: [id number] Date of Birth/Sex: Treating RN: 1976-03-25 (47 y.o. M) Primary Care Provider: [name] Other Clinician: Referring Provider: Treating Provider/Extender: [name] in Treatment: 115 Subjective Chief Complaint Information obtained from Patient Bilateral Plantar Foot Ulcers History of Present Illness (HPI) Wounds are12/03/2020 upon evaluation today patient presents for initial inspection here in our clinic concerning issues he has been having with the bottoms of his feet bilaterally. He states these actually occurred as wounds when he was hospitalized for 5 months secondary to Covid. He was apparently with tilting bed where he was in an upright position quite frequently and apparently this occurred in some way shape or form during that time. Fortunately there is no sign of active infection at this time. No fevers, chills, nausea, vomiting, or diarrhea. With that being said he still has substantial wounds on the plantar aspects of his feet Theragen require quite a bit of work to get these to heal. He has been using Santyl currently though that is been problematic both in receiving the medication as well as actually paid for it as it is become quite expensive. Prior to the experience with Covid the patient really did not have any major medical problems other than hypertension  he does have some mild generalized weakness following the Covid experience. 07/22/2020 on evaluation today patient appears to be doing okay in regard to his foot ulcers I feel like the wound beds are showing signs of better improvement that I do believe the Iodoflex is helping in this regard. With that being said he does have a lot of drainage currently and this is somewhat blue/green in nature which is consistent with Pseudomonas. I do think a culture today would be appropriate for [place]  to evaluate and see if that is indeed the case I would likely start him on antibiotic orally as well he is not allergic to Cipro knows of no issues he has had in the past 12/21; patient was admitted to the clinic earlier this month with bilateral presumed pressure ulcers on the bottom of his feet apparently related to excessive pressure from a tilt table arrangement in the intensive care unit. Patient relates this to being on ECMO but I am not really sure that is exactly related to that. I must say I have never seen anything like this. He has fairly extensive full-thickness wounds extending from his heel towards his midfoot mostly centered laterally. There is already been some healing distally. He does not appear to have an arterial issue. He has been using gentamicin to the wound surfaces with Iodoflex to help with ongoing debridement 1/6; this is a patient with pressure ulcers on the bottom of his feet related to excessive pressure from a standing position in the intensive care unit. He is complaining of a lot of pain in the right heel. He is not a diabetic. 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 We have been using Iodoflex to help prepare the surfaces of both wounds for an advanced treatment product. He is nonambulatory spending most of his time in a wheelchair I have asked him not to propel the wheelchair with his heels 1/13; in general his wounds look better not much surface area change we have been using Iodoflex as of last week. I did an x-ray of the right heel as the patient was complaining of pain. I had some thoughts about a stress fracture perhaps Achilles tendon problems however what it showed was erosive changes along the inferior aspect of the calcaneus he now has a MRI booked for 1/20. 1/20; in general his wounds continue to be better. Some improvement in the large narrow areas proximally in his foot. He is still complaining of pain in the right heel and  tenderness in certain areas of this wound. His MRI is tonight. I am not just looking for osteomyelitis that was brought up on the x-ray I am wondering about stress fractures, tendon ruptures etc. He has no such findings on the left. Also noteworthy is that the patient had critical illness neuropathy and some of the discomfort may be actual improvement in nerve function I am just not sure. These wounds were initially in the setting of severe critical illness related to COVID-19. He was put in a standing position. He may have also been on pressors at the point contributing to tissue ischemia. By his description at some point these wounds were grossly necrotic extending proximally up into the Achilles part of his heel. I do not know that I have ever really seen pictures of them like this although they may exist in epic We have ordered Tri layer Oasis. I am trying to stimulate some granulation in these areas. This is of course assuming the  MRI is negative for infection 1/27; since the patient was last here he saw Dr. Juleen China of infectious disease. He is planned for vancomycin and ceftriaxone. Prior operative culture grew MSSA. Also ordered baseline lab work. He also ordered arterial studies although the ABIs in our clinic were normal as well as his clinical exam these were normal I do not think he needs to see vascular surgery. His ABIs at the PTA were 1.22 in the right triphasic waveforms with a normal TBI of 1.15 on the left ABI of 1.22 with triphasic waveforms and a normal TBI of 1.08. Finally he saw Dr. Amalia Hailey who will follow him in for 2 months. At this point I do not think he felt that he needed a procedure on the right calcaneal bone. Dr. Juleen China is elected for broad-spectrum antibiotic The patient is still having pain in the right heel. He walks with a walker 2/3; wounds are generally smaller. He is tolerating his IV antibiotics. I believe this is vancomycin and ceftriaxone. We are still waiting  for Oasis burn in terms of his out-of-pocket [name] which he should be meeting soon given the IV antibiotics, MRIs etc. I have asked him to check in on this. We are using silver collagen in the meantime the wounds look better 2/10; tolerating IV vancomycin and Rocephin. We are waiting to apply for Oasis. Although I am not really sure where he is in his out-of-pocket [name] 2/17 started the first application of Oasis trilayer. Still on antibiotics. The wounds have generally look better. The area on the left has a little more surface slough requiring debridement [id number]; second application of Oasis trilayer. The wound surface granulation is generally look better. The area on the left with undermining laterally I think is come in a bit. [name], [name] ([id number]) [id number] Page 9 of 15 10/08/2020 upon evaluation today patient is here today for [employer] application #3. Fortunately he seems to be doing extremely well with regard to this and we are seeing a lot of new epithelial growth which is great news. Fortunately there is no signs of active infection at this time. 10/16/2020 upon evaluation today patient appears to be doing well with regard to his foot ulcers. Do believe the Oasis has been of benefit for him. I do not see any signs of infection right now which is great news and I think that he has a lot of new epithelial growth which is great to see as well. The patient is very pleased to hear all of this. I do think we can proceed with the Oasis trilayer #4 today. 3/18; not as much improvement in these areas on his heels that I was hoping. I did reapply trilateral Oasis today the tissue looks healthier but not as much fill in as I was hoping. 3/25; better looking today I think this is come in a bit the tissue looks healthier. Triple layer Oasis reapplied #6 4/1; somewhat better looking definitely better looking surface not as much change in surface area as I was hoping. He  may be spending more time [place] on days then he needs to although he does have heel offloading boots. Triple layer Oasis reapplied #7 4/7; unfortunately apparently [facility] will not approve any further Oasis which is unfortunate since the patient did respond nicely both in terms of the condition of the wound bed as well as surface area. There is still some drainage coming from the wound but not a lot there does not appear  to be any infection 4/15; we have been using Hydrofera Blue. He continues to have nice rims of epithelialization on the right greater than the left. The left the epithelialization is coming from the tip of his heel. There is moderate drainage. In this that concerns me about a total contact cast. There is no evidence of infection 4/29; patient has been using Hydrofera Blue with dressing changes. He has no complaints or issues today. 5/5; using Hydrofera Blue. I actually think that he looks marginally better than the last time I saw this 3 weeks ago. There are rims of epithelialization on the left thumb coming from the medial side on the right. Using Hydrofera Blue 5/12; using Hydrofera Blue. These continue to make improvements in surface area. His drainage was not listed as severe I therefore went ahead and put a cast on the left foot. Right foot we will continue to dress his previous 5/16; back for first total contact cast change. He did not tolerate this particularly well cast injury on the anterior tibia among other issues. Difficulty sleeping. I talked him about this in some detail and afterwards is elected to continue. I told him I would like to have a cast on for 3 weeks to see if this is going to help at all. I think he agreed 5/19; I think the wound is better. There is no tunneling towards his midfoot. The undermining medially also looks better. He has a rim of new skin distally. I think we are making progress here. The area on the left also continues to look  somewhat better to me using Hydrofera Blue. He has a list of complaints about the cast but none of them seem serious 5/26; patient presents for 1 week follow-up. He has been using a total contact cast and tolerating this well. Hydrofera Blue is the main dressing used. He denies signs of infection. 6/2 Hydrofera Blue total contact cast on the left. These were large ulcers that formed in intensive care unit where the patient was recovering from [place]. May have had something to do with being ventilated in an upright positiono Pressors etc. We have been able to get the areas down considerably and a viable surface. There is some epithelialization in both sides. Note made of drainage 6/9; changed to [facility] last time because of drainage. He arrives with better looking surfaces and dimensions on the left than the right. Paradoxically the right actually probes more towards his midfoot the left is largely close down but both of these look improved. Using a total contact cast on the left 6/16; complex wounds on his bilateral plantar heels which were initially pressure injury from a stay in the ICU with COVID. We have been using silver alginate most recently. His dimensions of come in quite dramatically however not recently. We have been putting the left foot in a total contact cast 6/23; complex wounds on the bilateral plantar heels. I been putting the left in the cast paradoxically the area on the right is the one that is going towards closure at a faster rate. Quite a bit of drainage on the left. The patient went to see Dr. Amalia Hailey who said he was going to standby for skin grafts. I had actually considered sending him for skin grafts however he would be mandatorily off his feet for a period of weeks to months. I am thinking that the area on the right is going to close on its own the area on the left has been more  stubborn even though we have him in a total contact cast 6/30; took him out of a total  contact cast last week is the right heel seem to be making better progress than the left where I was placing the cast. We are using silver alginate. Both wounds are smaller right greater than left 7/12; both wounds look as though they are making some progress. We are using silver alginate. Heel offloading boots 7/26; very gradual progress especially on the right. Using silver alginate. He is wearing heel offloading boots 8/18; he continues to close these wounds down very gradually. Using silver alginate. The problem polymen being definitive about this is areas of what appears to be callus around the margins. This is not a 100% of the area but certainly sizable especially on the right 9/1; bilateral plantar feet wounds secondary to prolonged pressure while being ventilated for COVID-19 in an upright position. Essentially pressure ulcers on the bottom of his feet. He is made substantial progress using silver alginate. 9/14; bilateral plantar feet wounds secondary to prolonged pressure. Making progress using silver alginate. 9/29 bilateral plantar feet wounds secondary to prolonged pressure. I changed him to Iodoflex last week. MolecuLight showing reddened blush fluorescence 10/11; patient presents for follow-up. He has no issues or complaints today. He denies signs of infection. He continues to use Iodoflex and antibiotic ointment to the wound beds. 10/27; 2-week follow-up. No evidence of infection. He has callus and thick dry skin around the wound margins we have been using Iodoflex and Bactroban which was in response to a moderate left MolecuLight reddish blush fluorescence. 11/10; 2-week follow-up. Wound margins again have thick callus however the measurements of the actual wound sites are a lot smaller. Everything looks reasonably healthy here. We have been using Iodoflex He was approved for prime matrix but I have elected to delay this given the improvement in the surface area. Hopefully I will  not regret that decision as were getting close to the end of the year in terms of insurance payment 12/8; 2-week follow-up. Wounds are generally smaller in size. These were initially substantial wounds extending into the forefoot all the way into the heel on the bilateral plantar feet. They are now both located on the plantar heel distal aspect both of these have a lot of callus around the wounds I used a #5 curette to remove this on the right and the left also some subcutaneous debris to try and get the wound edges were using Iodoflex. He has heel offloading shoe 12/22; 2-week follow-up. Not really much improvement. He has thick callus around the outer edges of both wounds. I remove this there is some nonviable subcutaneous tissue as well. We have been using Iodoflex. Her intake nurse and myself spontaneously thought of a total contact cast I went back in May. At that time we really were not seeing much of an improvement with a cast although the wound was in a much different situation I would like to retry this in 2 weeks and I discussed this with the patient 08/12/2021; the patient has had some improvement with the Iodoflex. The the area on the left heel plantar more improved than the right. I had to put him in a total contact cast on the left although I decided to put that off for 2 weeks. I am going to change his primary dressing to silver collagen. I think in both areas he has had some improvement most of the healing seems to be more proximal in the  heel. The wounds are in the mid aspect. A lot of thick callus on the right heel however. 1/19; we are using silver collagen on both plantar heel areas. He has had some improvement today. The left did not require any debridement. He still had some eschar on the right that was debrided but both seem to have contracted. I did not put it total contact cast on him today 2/2 we have been using silver collagen. The area on the right plantar heel has areas that  appear to be epithelialized interspersed with dry flaking callus and dry skin. I removed this. This really looks better than on the other side. On the left still a large area with raised edges and debris on the surface. 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 He has no issues or complaints today. 2/9; not much change in the left foot wound with 1 week of a cast we are using silver collagen. Silver collagen on the right side. The right side has been the better wound surface. We will reapply the total contact cast on the left 2/16; not much improvement on either side I been using silver collagen with a total contact cast on the left. I'm changing the Hydrofera Blue still with a total contact cast on the left 2/23; some improvement on both sides. Disappointing that he has thick callus around the area that we are putting in a total contact cast on the left. We've been using Hydrofera Blue on both wound areas. [name], [name] ([id number]) [id number] Page 10 of 15 This is a man who at essentially pressure ulcers in addition to ischemia caused by medications to support his blood pressure (pressors) in the ICU. He was being ventilated in the standing position for severe Covid. A Shiley the wounds extended across his entire foot but are now localized to his plantar heels bilaterally. We have made progress however neither areas healed. I continue to think the total contact cast is helped albeit painstakingly slowly. He has never wanted a plastic surgery consult although I don't know that they would be interested in grafting in area in this location. 10/07/2021: Continued improvement bilaterally. There is still some callus around the left wound, despite the total contact cast. He has some increased pain in his right midfoot around 1 particular area.  This has been painful in the past but seems to be a little bit worse. When his cast was removed today, there was an area on the heel of the left foot that looks a bit macerated. He is also complaining of pain in his left thigh and hip which he thinks is secondary to the limb length discrepancy caused by the cast. 10/14/2021: He continues to improve. A little bit less callus around the left wound. He continues to endorse pain in his right midfoot, but this is not as significant as it was last week. The maceration on his left heel is improved. 10/21/2021: Continued improvement to both wounds. The maceration on his left heel is no longer evident. Less callus bilaterally. Epithelialization progressing. 10/28/2021: Significant improvement this week. The right sided wound is nearly closed with just a small open area at the middle. No maceration seen on the left heel. Continued epithelialization on both sides. No concern for infection. 11/04/2021: T oday, the wounds were measured a little bit differently and come out as larger, but  I actually think they are about the same to potentially even smaller, particularly on the left. He continues to accumulate some callus on the right. 11/11/2021: T oday, the patient is expressing some concern that the left wound, despite being in the total contact cast, is not progressing at the same rate as the 1 on the right. He is interested in trying a week without the cast to see how the wound does. The wounds are roughly the same size as last week, with the right perhaps being a little bit smaller. He continues to build up callus on both sites. 11/18/2021: Last week, I permitted the patient to go without his total contact cast, just to see if the cast was really making any difference. Today, both wounds have deteriorated to some extent, suggesting that the cast is providing benefit, at least on the left. Both are larger and have accumulated callus, slough, and other  debris. 11/26/2021: I debrided both wounds quite aggressively last week in an effort to stimulate the healing cascade. This appears to have been effective as the left sided wound is a full centimeter shorter in length. Although the right was measured slightly larger, on inspection, it looks as though an area of epithelialized tissue was included in the measurements. We have been using PolyMem Ag on the wound surfaces with a total contact cast to the left. 12/02/2021: It appears that the intake personnel are including epithelialized tissue in his wound measurements; the right wound is almost completely epithelialized; there is just a crater at the proximal midfoot with some open areas. On the left, he has built up some callus, but the overall wound surface looks good. There is some senescent skin around the wound margin. He has been in PolyMem Ag bilaterally with a total contact cast on the left. 12/09/2021: The right wound is nearly closed; there is just a small open area at the mid calcaneus. On the left, the wound is smaller with minimal callus buildup. No significant drainage. 12/16/2021: The right calcaneal wound remains minimally open at the mid calcaneus; the rest has epithelialized. On the left, the wound is also a little bit smaller. There is some senescent tissue on the periphery. He is getting his first application of a trial skin substitute called Vendaje today. 12/23/2021: The wound on his right calcaneus is nearly closed; there is just a small area at the most distal aspect of the calcaneus that is open. On the left, the area where we applied to the skin substitute has a healthier-looking bed of granulation tissue. The wound dimensions are not significantly different on this side but the wound surface is improved. 12/30/2021: The wound on the right calcaneus has not changed significantly aside from some accumulation of callus. On the left, the open area is smaller and continues to have an  improved surface. He continues to accumulate callus around the wound. He is here for his third application of Vendaje. 01/06/2022: The right calcaneal wound is down to just a couple of millimeters. He continues to accumulate periwound callus. He unfortunately got his cast wet earlier in the week and his left foot is macerated, resulting in some superficial skin loss just distal to the open wound. The open wound itself, however, is much smaller and has a healthier appearing surface. He is here for his fourth application of Vendaje. 01/13/2022: The right calcaneal wound is about the same. Unfortunately, once again, his cast got wet and his foot is again macerated. This is caused the left  calcaneal wound to enlarge. He is here for his fifth application of Vendaje. 01/20/2022: The right calcaneal wound is very small. There is some periwound callus accumulation. He purchased a new cast protector last week and this has been effective in avoiding the maceration that has been occurring on the left. The left calcaneal wound is narrower and has a healthy and viable-appearing surface. He is here for his 6 application of Vendaje. 01/27/2022: The right calcaneal wound is down to just a pinhole. There is some periwound slough and callus. On the left, the wound is narrower and shorter by about a centimeter. The surface is robust and viable-appearing. Unfortunately, the rep for the trial skin substitute product did not provide any for [place] to use today. 02/04/2022: The right calcaneal wound remains unchanged. There is more accumulated callus. On the left, although the intake nurse measured it a little bit longer, it looks about the same to me. The surface has a layer of slough, but underneath this, there is good granulation tissue. 02/10/2022: The right calcaneus wound is nearly closed. There is still some callus that builds up around the site. The left side looks about the same in terms of dimensions, but the surface is more  robust and vital-appearing. 02/16/2022: The area of the right calcaneus that was nearly closed last week has closed, but there is a small opening at the mid foot where it looks like some moisture got retained and caused some reopening. The left foot wound is narrower and shallower. Both sites have a fair amount of periwound callus and eschar. 02/24/2022: The small midfoot opening on the right calcaneus is a little bit smaller today. The left foot wound is narrower and shallower. He continues to accumulate periwound callus. No concern for infection. 03/01/2022: The patient came to clinic early because he showered and got his cast wet. Fortunately, there is no significant maceration to his foot but the callus softened and it looks like the wound on his left calcaneus may be a little bit wider. The wound on his right calcaneus is just a narrow slit. Continued accumulation of periwound callus bilaterally. 03/08/2022: The wound on his right calcaneus is very nearly closed, just a small pinpoint opening under a bit of eschar; the left wound has come in quite a bit, as well. It is narrower and shorter than at our last visit. Still with accumulated callus and eschar bilaterally. 03/17/2022: The right calcaneal wound is healed. The left wound is smaller and the surface itself is very clean, but there is some blue-green staining on the periwound callus, concerning for Pseudomonas aeruginosa. 03/23/2022: The right calcaneal wound remains closed. The left wound continues to contract. No further blue-green staining. Small amount of callus and slough accumulation. 03/28/2022: He came in early today because he had gotten his cast wet. On inspection, the wound itself did not get wet or macerated, just a little bit of the forefoot. The wound itself is basically unchanged. JOSLYN, CONIGLIO ([id number]) [id number] Page 11 of 15 04/07/2022: The right foot wound remains closed. The left wound is the  smallest that I have seen it to date. It is narrower and shorter. It still continues to accumulate slough on the surface. 04/15/2022: There is a band of epithelium now dividing the small left plantar foot wound in 2. There is still some slough on the surface. 04/21/2022: The wound continues to narrow. Just a little bit of slough on the surface. He seems to be responding well to endoform. 04/28/2022:  Continued slow contraction of the wound. There is a little slough on the surface and some periwound callus. We have been using endoform and total contact cast. 05/05/2022: The wound appears to have stalled. There is slough and some periwound eschar/callus. No concern for infection, however. 05/12/2022: Unfortunately, his right foot has reopened. It is located at the most posterior aspect of his surgical incision. The area was noted to have drainage coming from it when his padding was removed today. Underneath some callus and senescent skin, there is an opening. No purulent drainage or malodor. On the left foot, the wound is again unchanged. There is some light blue staining on the callus, but no malodor or purulent drainage. 10/13; right and left heel remanence of extensive plantar foot wounds. These are better than I remember by quite a big margin however he is still left with wounds on the left plantar heel and the right plantar heel. Been using endoform bilaterally. A culture was done that showed apparently Pseudomonas but we are still waiting for the [facility] antibiotic to use gentamicin today. He is still very active by description I am not sure about the offloading of his noncasted right foot 10/20; both wounds right and left heel debrided not much change from last week. [employer] has arrived which is linezolid, gentamicin and ciprofloxacin we will use this with endoform. T contact cast on the left otal 06/02/2022: Both wounds are smaller today. There is still a fair amount of callus buildup around the  right foot ulcer. The left is more superficial and nearly flush with the surrounding tissues. Also with slough and eschar buildup. 06/10/2022: The right sided wound appears to be nearly closed, if not completely so, although it is somewhat difficult to tell given the abnormal tissue and scarring in his foot. There is a fair amount of callus and crust accumulation. On the left, the wound looks about the same, again with callus and slough. He has an appointment next Thursday with Dr. Loel Lofty in podiatry; I am hopeful that there may be some reconstructive options available for Mr. Vandevoort. 06/16/2022: Both wounds have some eschar and callus accumulation. The right sided wound is extremely narrow and barely open; the left is narrower than last week. There is a little bit of slough. He has his appointment in podiatry later today. 06/23/2022: The patient met with Dr. Loel Lofty last week and unfortunately, there are no reconstructive options that he believes would be helpful. He did order an MRI to evaluate for osteomyelitis and fortunately, none was seen. The left sided wound is a little bit shorter and narrower today. The right sided wound is about the same. There is callus and eschar accumulation bilaterally. 06/29/2022: Both feet have improved from last week. There is epithelium making a valiant effort to creep across the surface on the left. The right side looks like it got a little dry and the deep crevasse in his midfoot has cracked. Both have eschar and there is some slough on the left. 07/07/2022: Both feet have improved. There is epithelium completely covering the calcaneus on the right with just a small opening in the crevasse in his midfoot. On the left, the open area of tissue is smaller but he continues to build up callus/eschar and slough. 07/15/2022: The opening in the midfoot on the right is about the same size, covered with eschar and a little bit of slough. The open portion of the left  wound is narrower and shorter with a bit of slough buildup.  He admits to being on his feet more than recommended. 12/14; as far as I can tell everything on the right foot is closed. There is some eschar I removed some of this I cannot identify any open wound here. As usual this will be a very vulnerable area going forward. On the left this looks really quite healthy. I was pleasantly surprised to see how good this looked. Wound is certainly smaller and there appears to be healthy epithelialization. He has been using Promogran on the right and endoform on the left. He has been offloading the right foot with a heel offloading boot and he has a running shoe on the right foot 08/04/2022: The right foot remains closed. He has a thick cushioned insole in his sneaker. The left sided wound is smaller with just some slough and eschar accumulation. He is wearing the heel off loader on this foot. 08/15/2022: The right foot remains closed. The left wound has narrowed further. There is some slough and eschar accumulation. 08/25/2022: We put him in a peg assist shoe insert and as a result, he has more epithelialization of the ulcer with minimal slough and eschar accumulation. 09/01/2022: The wound is smaller by about half this week. Still with some slough on the surface. The peg assist shoe seems to be doing a remarkable job of adequately offloading the site. 09/08/2022: There is a little bit more epithelium coming in. There is some slough and callus buildup. 09/16/2022: The wound measures about the same size, but the epithelium that has grown in looks more robust and stronger. There is some slough and callus buildup. 09/23/2022: The wound remains about the same size. The skin edges are looking rather senescent. 09/29/2022: The aggressive debridement I performed last week seems to have been effective. The wound is smaller and has significantly less slough accumulation. Patient History Information obtained from  Patient. Family History Cancer - Maternal Grandparents, Diabetes - Father,Paternal Grandparents, Heart Disease - Maternal Grandparents, Hypertension - Father,Paternal Grandparents, Lung Disease - Siblings, No family history of Hereditary Spherocytosis, Kidney Disease, Seizures, Stroke, Thyroid Problems, Tuberculosis. Social History Never smoker, Marital Status - Married, Alcohol Use - Never, Drug Use - No History, Caffeine Use - Daily - tea, soda. Medical History Eyes Denies history of Cataracts, Glaucoma, Optic Neuritis Ear/Nose/Mouth/Throat Denies history of Chronic sinus problems/congestion, Middle ear problems Hematologic/Lymphatic [name], [name] ([id number]) [id number] Page 12 of 15 Denies history of Anemia, Hemophilia, Human Immunodeficiency Virus, Lymphedema, Sickle Cell Disease Respiratory Patient has history of Asthma Denies history of Aspiration, Chronic Obstructive Pulmonary Disease (COPD), Pneumothorax, Sleep Apnea, Tuberculosis Cardiovascular Patient has history of Angina - with COVID, Hypertension Denies history of Arrhythmia, Congestive Heart Failure, Coronary Artery Disease, Deep Vein Thrombosis, Hypotension, Myocardial Infarction, Peripheral Arterial Disease, Peripheral Venous Disease, Phlebitis, Vasculitis Gastrointestinal Denies history of Cirrhosis , Colitis, Crohnoos, Hepatitis A, Hepatitis B, Hepatitis C Endocrine Denies history of Type I Diabetes, Type II Diabetes Genitourinary Denies history of End Stage Renal Disease Immunological Denies history of Lupus Erythematosus, Raynaudoos, Scleroderma Integumentary (Skin) Denies history of History of Burn Musculoskeletal Denies history of Gout, Rheumatoid Arthritis, Osteoarthritis, Osteomyelitis Neurologic Denies history of Dementia, Neuropathy, Quadriplegia, Paraplegia, Seizure Disorder Oncologic Denies history of Received Chemotherapy, Received Radiation Psychiatric Denies  history of Anorexia/bulimia, Confinement Anxiety Hospitalization/Surgery History - COVID PNA 07/22/2019- 11/14/2019. - 03/27/2020 wound debridement/ skin graft. Medical A Surgical History Notes nd Constitutional Symptoms (General Health) COVID PNA 07/22/2019-11/14/2019 [facility] ECMO, foot drop left foot , Genitourinary kidney stone Psychiatric anxiety Objective Constitutional no  acute distress. Vitals Time Taken: 10:35 AM, Height: 69 in, Weight: 280 lbs, BMI: 41.3, Temperature: 98.4 F, Pulse: 79 bpm, Respiratory Rate: 16 breaths/min, Blood Pressure: 115/78 mmHg. Respiratory Normal work of breathing on room air. General Notes: 09/29/2022: The aggressive debridement I performed last week seems to have been effective. The wound is smaller and has significantly less slough accumulation. Integumentary (Hair, Skin) Wound #2 status is Open. Original cause of wound was Pressure Injury. The date acquired was: 10/07/2019. The wound has been in treatment 115 weeks. The wound is located on the Left Calcaneus. The wound measures 0.9cm length x 0.4cm width x 0.1cm depth; 0.283cm^2 area and 0.028cm^3 volume. There is Fat Layer (Subcutaneous Tissue) exposed. There is no tunneling or undermining noted. There is a medium amount of serosanguineous drainage noted. The wound margin is thickened. There is large (67-100%) red, pink granulation within the wound bed. There is a small (1-33%) amount of necrotic tissue within the wound bed including Adherent Slough. The periwound skin appearance had no abnormalities noted for moisture. The periwound skin appearance had no abnormalities noted for color. The periwound skin appearance exhibited: Callus, Scarring. Periwound temperature was noted as No Abnormality. Assessment Active Problems ICD-10 Non-pressure chronic ulcer of other part of left foot with fat layer exposed Procedures [name], [name] ([id number]) [id number] Page 13 of 15 Wound  #2 Pre-procedure diagnosis of Wound #2 is a Pressure Ulcer located on the Left Calcaneus . There was a Selective/Open Wound Non-Viable Tissue Debridement with a total area of 0.36 sq cm performed by Fredirick Maudlin, MD. With the following instrument(s): Curette Material removed includes Eschar and Slough and after achieving pain control using Lidocaine 5% topical ointment. No specimens were taken. A time out was conducted at 10:42, prior to the start of the procedure. A Minimum amount of bleeding was controlled with Pressure. The procedure was tolerated well with a pain level of 0 throughout and a pain level of 0 following the procedure. Post Debridement Measurements: 0.9cm length x 0.4cm width x 0.1cm depth; 0.028cm^3 volume. Post debridement Stage noted as Category/Stage III. Character of Wound/Ulcer Post Debridement is improved. Post procedure Diagnosis Wound #2: Same as Pre-Procedure General Notes: Scribed for Dr. [name] by J.Scotton. Plan Follow-up Appointments: Return Appointment in 1 week. - Dr. [name] Room 2 Anesthetic: Wound #2 Left Calcaneus: (In clinic) Topical Lidocaine 4% applied to wound bed - In clinic Bathing/ Shower/ Hygiene: May shower and wash wound with soap and water. Edema Control - Lymphedema / SCD / Other: Avoid standing for long periods of time. Moisturize legs daily. - as needed Off-Loading: Other: - keep pressure off of the bottom of your feet. Elevate legs throughout the day. Use the Shoe with the PegAssist off-loading insole Additional Orders / Instructions: Follow Nutritious Diet - Try to get 70-100 grams of Protein a day+ WOUND #2: - Calcaneus Wound Laterality: Left Cleanser: Normal Saline (Generic) Every Other Day/30 Days Discharge Instructions: Cleanse the wound with Normal Saline prior to applying a clean dressing using gauze sponges, not tissue or cotton balls. Cleanser: Wound Cleanser (Generic) Every Other Day/30 Days Discharge Instructions: Cleanse  the wound with wound cleanser prior to applying a clean dressing using gauze sponges, not tissue or cotton balls. Prim Dressing: Endoform 2x2 in (Generic) Every Other Day/30 Days ary Discharge Instructions: Moisten with saline or Hydrogel Secondary Dressing: Optifoam Non-Adhesive Dressing, 4x4 in (Generic) Every Other Day/30 Days Discharge Instructions: Apply over primary dressing as directed. Secondary Dressing: Woven Gauze Sponge, Non-Sterile 4x4 in (  Generic) Every Other Day/30 Days Discharge Instructions: Apply over primary dressing as directed. Secured With: 55M Medipore H Soft Cloth Surgical T ape, 4 x 10 (in/yd) (Generic) Every Other Day/30 Days Discharge Instructions: Secure with tape as directed. Com pression Wrap: Kerlix Roll 4.5x3.1 (in/yd) Every Other Day/30 Days Discharge Instructions: Apply Kerlix and Coban compression as directed. 09/29/2022: The aggressive debridement I performed last week seems to have been effective. The wound is smaller and has significantly less slough accumulation. I used a curette to debride slough and eschar from the wound. We will continue [place] topical antibiotic compound with endoform. He will continue to wear his peg assist offloading shoe. Follow-up in 1 week. Electronic Signature(s) Signed: 09/29/2022 10:58:40 AM By: Fredirick Maudlin MD FACS Entered By: Fredirick Maudlin on 09/29/2022 10:58:40 -------------------------------------------------------------------------------- HxROS Details Patient Name: Date of Service: [name], [place] 09/29/2022 10:15 A M Medical Record Number: [id number] Patient Account Number: [id number] Date of Birth/Sex: Treating RN: 1976-01-24 (47 y.o. M) Primary Care Provider: [name] Other Clinician: Referring Provider: Treating Provider/Extender: [name] in Treatment: 115 Information Obtained From Patient Constitutional Symptoms ([place]) [name], [name] ([id number])  [id number] Page 14 of 15 Medical History: Past Medical History Notes: COVID PNA 07/22/2019-11/14/2019 [facility] ECMO, foot drop left foot , Eyes Medical History: Negative for: Cataracts; Glaucoma; Optic Neuritis Ear/Nose/Mouth/Throat Medical History: Negative for: Chronic sinus problems/congestion; Middle ear problems Hematologic/Lymphatic Medical History: Negative for: Anemia; Hemophilia; Human Immunodeficiency Virus; Lymphedema; Sickle Cell Disease Respiratory Medical History: Positive for: Asthma Negative for: Aspiration; Chronic Obstructive Pulmonary Disease (COPD); Pneumothorax; Sleep Apnea; Tuberculosis Cardiovascular Medical History: Positive for: Angina - with COVID; Hypertension Negative for: Arrhythmia; Congestive Heart Failure; Coronary Artery Disease; Deep Vein Thrombosis; Hypotension; Myocardial Infarction; Peripheral Arterial Disease; Peripheral Venous Disease; Phlebitis; Vasculitis Gastrointestinal Medical History: Negative for: Cirrhosis ; Colitis; Crohns; Hepatitis A; Hepatitis B; Hepatitis C Endocrine Medical History: Negative for: Type I Diabetes; Type II Diabetes Genitourinary Medical History: Negative for: End Stage Renal Disease Past Medical History Notes: kidney stone Immunological Medical History: Negative for: Lupus Erythematosus; Raynauds; Scleroderma Integumentary (Skin) Medical History: Negative for: History of Burn Musculoskeletal Medical History: Negative for: Gout; Rheumatoid Arthritis; Osteoarthritis; Osteomyelitis Neurologic Medical History: Negative for: Dementia; Neuropathy; Quadriplegia; Paraplegia; Seizure Disorder Oncologic Medical History: Negative for: Received Chemotherapy; Received Radiation Psychiatric Medical History: Negative for: Anorexia/bulimia; Confinement Anxiety Past Medical History Notes: anxiety [name], [name] ([id number]) [id number] Page 15 of  15 Immunizations Pneumococcal Vaccine: Received Pneumococcal Vaccination: No Implantable Devices None Hospitalization / Surgery History Type of Hospitalization/Surgery COVID PNA 07/22/2019- 11/14/2019 03/27/2020 wound debridement/ skin graft Family and Social History Cancer: Yes - Maternal Grandparents; Diabetes: Yes - Father,Paternal Grandparents; Heart Disease: Yes - Maternal Grandparents; Hereditary Spherocytosis: No; Hypertension: Yes - Father,Paternal Grandparents; Kidney Disease: No; Lung Disease: Yes - Siblings; Seizures: No; Stroke: No; Thyroid Problems: No; Tuberculosis: No; Never smoker; Marital Status - Married; Alcohol Use: Never; Drug Use: No History; Caffeine Use: Daily - tea, soda; Financial Concerns: No; Food, Clothing or Shelter Needs: No; Support System Lacking: No; Transportation Concerns: No Electronic Signature(s) Signed: 09/29/2022 11:09:37 AM By: Fredirick Maudlin MD FACS Entered By: Fredirick Maudlin on 09/29/2022 10:57:23 -------------------------------------------------------------------------------- SuperBill Details Patient Name: Date of Service: [name], [name] E. 09/29/2022 Medical Record Number: [id number] Patient Account Number: [id number] Date of Birth/Sex: Treating RN: [birth year] (47 y.o. M) Primary Care Provider: [name] Other Clinician: Referring Provider: Treating Provider/Extender: [name] in Treatment: 115 Diagnosis Coding ICD-10 Codes Code Description [id number]  Non-pressure chronic ulcer of other part of left foot with fat layer exposed Facility Procedures : CPT4 Code: [id number] Description: [id number] - DEBRIDE WOUND 1ST 20 SQ CM OR < ICD-10 Diagnosis Description L97.522 Non-pressure chronic ulcer of other part of left foot with fat layer exposed Modifier: Quantity: 1 Physician Procedures : CPT4 Code Description Modifier [id number] - WC PHYS LEVEL 4 - EST PT 25 ICD-10 Diagnosis Description L97.522 Non-pressure  chronic ulcer of other part of left foot with fat layer exposed Quantity: 1 : [id number] - WC PHYS DEBR WO ANESTH 20 SQ CM ICD-10 Diagnosis Description L97.522 Non-pressure chronic ulcer of other part of left foot with fat layer exposed Quantity: 1 Electronic Signature(s) Signed: 09/29/2022 10:58:55 AM By: Fredirick Maudlin MD FACS Entered By: Fredirick Maudlin on 09/29/2022 10:58:55

## 2022-09-30 NOTE — Progress Notes (Unsigned)
Chief Complaint:   OBESITY [name] (MR# [id number]) is a 48 y.o. male who presents for evaluation and treatment of obesity and related comorbidities. Current BMI is Body mass index is 46.68 kg/m. [name] has been struggling with his weight for many years and has been unsuccessful in either losing weight, maintaining weight loss, or reaching his healthy weight goal.  [name] is currently in the action stage of change and ready to dedicate time achieving and maintaining a healthier weight. [name] is interested in becoming our patient and working on intensive lifestyle modifications including (but not limited to) diet and exercise for weight loss.  [name] lives with wife and 63 year old son. He is currently out of work. Not fasting today. He lifts weights in wheel chair 3 times a week and eats out 5 times a week. [name] has been overweight since childhood. He is picky about vegetables and craves junk food. Pt doesn't like to cook. His wife works late.  Son is a picky eater.  [name] has muscle weakness and foot ulcers on both feet, post COVID in 2020.  [name]'s habits were reviewed today and are as follows: His family eats meals together, he thinks his family will eat healthier with him, his desired weight loss is 57-67 lbs, he has been heavy most of his life, he started gaining weight in middle school, his heaviest weight ever was 314 pounds, he is a picky eater and doesn't like to eat healthier foods, he has significant food cravings issues, he snacks frequently in the evenings, he is frequently drinking liquids with calories, he frequently makes poor food choices, he has problems with excessive hunger, he frequently eats larger portions than normal, he has binge eating behaviors, and he struggles with emotional eating.  Depression Screen [name]'s Food and Mood (modified PHQ-9) score was 18.     09/07/2022   10:07 AM  Depression screen PHQ 2/9  Decreased Interest 3  Down, Depressed, Hopeless 3   PHQ - 2 Score 6  Altered sleeping 3  Tired, decreased energy 3  Change in appetite 2  Feeling bad or failure about yourself  1  Trouble concentrating 2  Moving slowly or fidgety/restless 1  Suicidal thoughts 0  PHQ-9 Score 18  Difficult doing work/chores Very difficult   Subjective:   1. Other fatigue [name] admits to daytime somnolence and admits to waking up still tired. Patient has a history of symptoms of daytime fatigue, morning fatigue, and morning headache. [name] generally gets  4.5-5.5  hours of sleep per night, and states that he has poor sleep quality. Snoring is present. Apneic episodes are present. Epworth Sleepiness Score is 5. EKG: positive for PVCs- consumes excess caffeine   2. SOBOE (shortness of breath on exertion) [name] notes increasing shortness of breath with exercising and seems to be worsening over time with weight gain. He notes getting out of breath sooner with activity than he used to. This has gotten worse recently. [name] denies shortness of breath at rest or orthopnea. Had to postpone IC due to pt not fasting.  3. Mood disorder (HCC) Medication/s: Abilify 2 mg QHS; Cymbalta 40 mg- 2 caps QD; Lamictal 150 mg QD; Trazodone 200 mg QHS Bariatric PHQ-9: 18 [name] is seeing [facility] counseling.  4. Essential hypertension BP elevated today. Home BP readings: 120s/60s. Medication/s: metoprolol 25 mg BID; amlodipine 2.5 mg QD  5. Pre-diabetes Last A1c 5.7 on 08/31/2020 [name] consumes excess starches and sweets with  limited exercise.  6. Nocturnal hypoxemia He is on 2 L of oxygen at night. Lacking adequate sleep at night. Tested negative for OSA.  7. Ulcer of left foot, unspecified ulcer stage ([name]) Seeing wound care. Pt with slow healing ulcers and muscle atrophy since severe COVID-19 in 2020. Denies smoking or history of diabetes.  Assessment/Plan:   1. Other fatigue [name] does feel that his weight is causing his energy to be lower than it  should be. Fatigue may be related to obesity, depression or many other causes. Labs will be ordered, and in the meanwhile, [name] will focus on self care including making healthy food choices, increasing physical activity and focusing on stress reduction. Fasting labs ordered, to be drawn next week.  Lab/Orders today or future: - EKG 12-Lead - Vitamin B12 - CBC with Differential/Platelet - Comprehensive metabolic panel - Folate - Hemoglobin A1c - Insulin, random - Lipid panel - T4, free - TSH - VITAMIN D 25 Hydroxy (Vit-D Deficiency, Fractures)  2. SOBOE (shortness of breath on exertion) [name] does feel that he gets out of breath more easily that he used to when he exercises. [name]'s shortness of breath appears to be obesity related and exercise induced. He has agreed to work on weight loss and gradually increase exercise to treat his exercise induced shortness of breath. Will continue to monitor closely. IC next visit; reduce caffeine intake.  3. Mood disorder ([name]) Continue all psych meds; work on setting goals.  4. Essential hypertension Continue all BP meds as directed. Look for BP improvements with weight loss.  5. Pre-diabetes Reduce sugar intake. Fasting insulin and A1c at next visit.  6. Nocturnal hypoxemia Aim for 8 hours of sleep at night with supplemental oxygen.  7. Ulcer of left foot, unspecified ulcer stage ([name]) Focus on weight loss and improved nutrition for wound healing.  Plan for physical therapy once foot ulcers heal.  8. Depression screening [name] had a positive depression screening. Depression is commonly associated with obesity and often results in emotional eating behaviors. We will monitor this closely and work on CBT to help improve the non-hunger eating patterns. Referral to Psychology may be required if no improvement is seen as he continues in our clinic.  9. Morbid obesity ([name]) 10. BMI 45.0-49.9, adult [name]) Dining Out Guide given.  [name] is  currently in the action stage of change and his goal is to continue with weight loss efforts. I recommend [name] begin the structured treatment plan as follows:  He has agreed to {MWMwtlossportion/plan2:23431}. ?***  Exercise goals:  As is    Behavioral modification strategies: decreasing liquid calories, decreasing eating out, meal planning and cooking strategies, and planning for success.  He was informed of the importance of frequent follow-up visits to maximize his success with intensive lifestyle modifications for his multiple health conditions. He was informed we would discuss his lab results at his next visit unless there is a critical issue that needs to be addressed sooner. [name] agreed to keep his next visit at the agreed upon time to discuss these results.  Objective:   Blood pressure (!) 166/92, pulse 89, temperature 98.2 F (36.8 C), height '5\' 8"'$  (1.727 m), weight (!) 307 lb (139.3 kg), SpO2 93 %. Body mass index is 46.68 kg/m.  EKG: Sinus rhythm, rate 83 with PAC's.  Indirect Calorimeter NOT completed today   General: Cooperative, alert, well developed, in no acute distress. HEENT: Conjunctivae and lids unremarkable. Cardiovascular: Regular rhythm.  Lungs: Normal work of breathing.  Neurologic: No focal deficits.   Lab Results  Component Value Date   CREATININE 0.94 09/21/2022   BUN 17 09/21/2022   NA 145 (H) 09/21/2022   K 4.3 09/21/2022   CL 102 09/21/2022   CO2 22 09/21/2022   Lab Results  Component Value Date   ALT 23 09/21/2022   AST 21 09/21/2022   ALKPHOS 91 09/21/2022   BILITOT 0.4 09/21/2022   Lab Results  Component Value Date   HGBA1C 6.1 (H) 09/21/2022   HGBA1C 5.7 (H) 08/31/2020   HGBA1C 5.5 09/23/2019   HGBA1C 6.7 (H) 07/24/2019   HGBA1C 5.7 (H) 09/30/2017   Lab Results  Component Value Date   INSULIN 21.5 09/21/2022   Lab Results  Component Value Date   TSH 1.410 09/21/2022   Lab Results  Component Value Date   CHOL 156 09/21/2022    HDL 33 (L) 09/21/2022   LDLCALC 97 09/21/2022   TRIG 148 09/21/2022   CHOLHDL 4.7 09/21/2022   Lab Results  Component Value Date   WBC 6.6 09/21/2022   HGB 16.0 09/21/2022   HCT 51.0 09/21/2022   MCV 84 09/21/2022   PLT 132 (L) 09/21/2022   Lab Results  Component Value Date   FERRITIN 750 (H) 07/24/2019   Attestation Statements:   Reviewed by clinician on day of visit: allergies, medications, problem list, medical history, surgical history, family history, social history, and previous encounter notes.  I, Kathlene November, BS, CMA, am acting as transcriptionist for Loyal Gambler, DO.   I have reviewed the above documentation for accuracy and completeness, and I agree with the above. - ***

## 2022-09-30 NOTE — Progress Notes (Signed)
[name] ([id number]) [id number].pdf Page 1 of 7 Visit Report for 09/29/2022 Arrival Information Details Patient Name: Date of Service: [name], [place] 09/29/2022 10:15 A M Medical Record Number: [id number] Patient Account Number: [id number] Date of Birth/Sex: Treating RN: 12/06/1975 (47 y.o. [name] Primary Care Aveya Beal: [name] Other Clinician: Referring [name]: Treating [name]/Extender: [name] in Treatment: 91 Visit Information History Since Last Visit Added or deleted any medications: No Patient Arrived: Wheel Chair Any new allergies or adverse reactions: No Arrival Time: 10:34 Had a fall or experienced change in No Accompanied By: [name] activities of daily living that may affect Transfer Assistance: Manual risk of falls: Patient Identification Verified: Yes Signs or symptoms of abuse/neglect since last visito No Patient Requires Transmission-Based Precautions: No Hospitalized since last visit: No Patient Has Alerts: No Implantable device outside of the clinic excluding No cellular tissue based products placed in the center since last visit: Has Dressing in Place as Prescribed: Yes Pain Present Now: No Electronic Signature(s) Signed: 09/29/2022 5:20:32 PM By: [name] Entered By: [name] on 09/29/2022 10:48:16 -------------------------------------------------------------------------------- Encounter Discharge Information Details Patient Name: Date of Service: [name], [place]. 09/29/2022 10:15 A M Medical Record Number: [id number] Patient Account Number: [id number] Date of Birth/Sex: Treating RN: 1976/07/13 (47 y.o. [name] Primary Care Jeniece Hannis: [name] Other Clinician: Referring Khalea Ventura: Treating [name]/Extender: [name] in Treatment: 115 Encounter Discharge Information Items Post Procedure Vitals Discharge Condition:  Stable Temperature (F): 98.4 Ambulatory Status: Wheelchair Pulse (bpm): 79 Discharge Destination: Home Respiratory Rate (breaths/min): 16 Transportation: Private Auto Blood Pressure (mmHg): 115/70 Accompanied By: [name] Schedule Follow-up Appointment: Yes Clinical Summary of Care: Patient Declined Electronic Signature(s) Signed: 09/29/2022 5:20:32 PM By: [name] Entered By: [name] on 09/29/2022 17:20:08 [name] ([id number]) [id number] Page 2 of 7 -------------------------------------------------------------------------------- Lower Extremity Assessment Details Patient Name: Date of Service: [name] [place] 09/29/2022 10:15 A M Medical Record Number: [id number] Patient Account Number: [id number] Date of Birth/Sex: Treating RN: 1975-08-18 (47 y.o. [name] Primary Care Marigene Erler: [name] Other Clinician: Referring Yoana Staib: Treating [name]/Extender: [name] in Treatment: 115 Edema Assessment Assessed: [name]: No] [name]: No] Edema: [Left: N] [Right: o] Calf Left: Right: Point of Measurement: 29 cm From Medial Instep 44.1 cm Ankle Left: Right: Point of Measurement: 9 cm From Medial Instep 24.7 cm Vascular Assessment Pulses: Dorsalis Pedis Palpable: [Left:Yes] Electronic Signature(s) Signed: 09/29/2022 5:20:32 PM By: [name] Entered By: [name] on 09/29/2022 10:49:19 -------------------------------------------------------------------------------- Multi Wound Chart Details Patient Name: Date of Service: [name], [place] 09/29/2022 10:15 A M Medical Record Number: [id number] Patient Account Number: [id number] Date of Birth/Sex: Treating RN: 01-Jul-1976 (47 y.o. M) Primary Care Sarye Kath: [name] Other Clinician: Referring Jaysiah Marchetta: Treating [name]/Extender: [name] in Treatment: 115 Vital Signs Height(in): 69 Pulse(bpm):  79 Weight(lbs): 280 Blood Pressure(mmHg): 115/78 Body Mass Index(BMI): 41.3 Temperature(F): 98.4 Respiratory Rate(breaths/min): 16 [2:Photos:] [N/A:N/A] Left Calcaneus N/A N/A Wound Location: Pressure Injury N/A N/A Wounding Event: Pressure Ulcer N/A N/A Primary Etiology: Asthma, Angina, Hypertension N/A N/A Comorbid History: 10/07/2019 N/A N/A Date Acquired: 115 N/A N/A Weeks of Treatment: Open N/A N/A Wound Status: No N/A N/A Wound Recurrence: 0.9x0.4x0.1 N/A N/A Measurements L x W x D (cm) 0.283 N/A N/A A (cm) : rea 0.028 N/A N/A Volume (cm) : 99.00% N/A N/A % Reduction in A rea: 99.90% N/A N/A %  Reduction in Volume: Category/Stage III N/A N/A Classification: Medium N/A N/A Exudate A mount: Serosanguineous N/A N/A Exudate Type: red, brown N/A N/A Exudate Color: Thickened N/A N/A Wound Margin: Large (67-100%) N/A N/A Granulation A mount: Red, Pink N/A N/A Granulation Quality: Small (1-33%) N/A N/A Necrotic A mount: Fat Layer (Subcutaneous Tissue): Yes N/A N/A Exposed Structures: Fascia: No Tendon: No Muscle: No Joint: No Bone: No Medium (34-66%) N/A N/A Epithelialization: Debridement - Selective/Open Wound N/A N/A Debridement: Pre-procedure Verification/Time Out 10:42 N/A N/A Taken: Lidocaine 5% topical ointment N/A N/A Pain Control: Necrotic/Eschar, Slough N/A N/A Tissue Debrided: Non-Viable Tissue N/A N/A Level: 0.36 N/A N/A Debridement A (sq cm): rea Curette N/A N/A Instrument: Minimum N/A N/A Bleeding: Pressure N/A N/A Hemostasis A chieved: 0 N/A N/A Procedural Pain: 0 N/A N/A Post Procedural Pain: Procedure was tolerated well N/A N/A Debridement Treatment Response: 0.9x0.4x0.1 N/A N/A Post Debridement Measurements L x W x D (cm) 0.028 N/A N/A Post Debridement Volume: (cm) Category/Stage III N/A N/A Post Debridement Stage: Callus: Yes N/A N/A Periwound Skin Texture: Scarring: Yes Maceration: Yes N/A N/A Periwound  Skin Moisture: No Abnormalities Noted N/A N/A Periwound Skin Color: No Abnormality N/A N/A Temperature: Debridement N/A N/A Procedures Performed: Treatment Notes Electronic Signature(s) Signed: 09/29/2022 10:55:56 AM By: Fredirick Maudlin MD FACS Entered By: Fredirick Maudlin on 09/29/2022 10:55:56 -------------------------------------------------------------------------------- Multi-Disciplinary Care Plan Details Patient Name: Date of Service: [name], [name] 09/29/2022 10:15 A M Medical Record Number: [id number] Patient Account Number: [id number] Date of Birth/Sex: Treating RN: Aug 12, 1975 (47 y.o. [name] Primary Care Marieme Mcmackin: [name] Other Clinician: Referring Ayshia Gramlich: Treating Alexy Heldt/Extender: [name] in Treatment: [place] reviewed with physician [address] [place], [place] ([id number]) [id number] Page 4 of 7 Wound/Skin Impairment Nursing Diagnoses: Impaired tissue integrity Knowledge deficit related to ulceration/compromised skin integrity Goals: Patient/caregiver will verbalize understanding of skin care regimen Date Initiated: 07/15/2020 Target Resolution Date: 03/08/2023 Goal Status: Active Ulcer/skin breakdown will have a volume reduction of 30% by week 4 Date Initiated: 07/15/2020 Date Inactivated: 08/20/2020 Target Resolution Date: 09/03/2020 Goal Status: Unmet Unmet Reason: no major changes. Ulcer/skin breakdown will heal within 14 weeks Date Initiated: 12/04/2020 Date Inactivated: 12/10/2020 Target Resolution Date: 12/10/2020 Unmet Reason: wounds still open at 14 Goal Status: Unmet weeks and today 21 weeks. Interventions: Assess patient/caregiver ability to obtain necessary supplies Assess patient/caregiver ability to perform ulcer/skin care regimen upon admission and as needed Assess ulceration(s) every visit Provide education on ulcer and skin care Treatment  Activities: Skin care regimen initiated : 07/15/2020 Topical wound management initiated : 07/15/2020 Notes: Electronic Signature(s) Signed: 09/29/2022 5:20:32 PM By: [name] Entered By: [name] on 09/29/2022 17:18:32 -------------------------------------------------------------------------------- Pain Assessment Details Patient Name: Date of Service: [name], [place] 09/29/2022 10:15 A M Medical Record Number: [id number] Patient Account Number: [id number] Date of Birth/Sex: Treating RN: 1976/01/19 (47 y.o. [name] Primary Care Denai Caba: [name] Other Clinician: Referring Khylei Wilms: Treating Nina Mondor/Extender: [name] in Treatment: 115 Active Problems Location of Pain Severity and Description of Pain Patient Has Paino No Site Locations Pain Management and Medication [name], [name] ([id number]) [phone].pdf Page 5 of 7 Current Pain Management: Electronic Signature(s) Signed: 09/29/2022 5:20:32 PM By: [name] Entered By: [name] on 09/29/2022 10:49:09 -------------------------------------------------------------------------------- Patient/Caregiver Education Details Patient Name: Date of Service: [name], [place] 2/22/2024andnbsp10:15 [place] Record Number: [id number] Patient Account Number: [id number] Date of Birth/Gender: Treating RN: Dec 20, 1975 (  47 y.o. [name] Primary Care Physician: [name] Other Clinician: Referring Physician: Treating Physician/Extender: [name] in Treatment: 115 Education Assessment Education Provided To: Patient Education Topics Provided Wound/Skin Impairment: Methods: Explain/Verbal Responses: Return demonstration correctly Electronic Signature(s) Signed: 09/29/2022 5:20:32 PM By: [name] Entered By: [name] on 09/29/2022  17:18:48 -------------------------------------------------------------------------------- Wound Assessment Details Patient Name: Date of Service: [name], [place] 09/29/2022 10:15 A M Medical Record Number: [id number] Patient Account Number: [id number] Date of Birth/Sex: Treating RN: 1975-12-06 (47 y.o. [name] Primary Care Daevon Holdren: [name] Other Clinician: Referring Anand Tejada: Treating Jalaya Sarver/Extender: [name] in Treatment: 115 Wound Status Wound Number: 2 Primary Etiology: Pressure Ulcer Wound Location: Left Calcaneus Wound Status: Open Wounding Event: Pressure Injury Comorbid History: Asthma, Angina, Hypertension Date Acquired: 10/07/2019 Weeks Of Treatment: 115 Clustered Wound: No Photos [name], [name] ([id number]) [id number] Page 6 of 7 Wound Measurements Length: (cm) 0.9 Width: (cm) 0.4 Depth: (cm) 0.1 Area: (cm) 0.283 Volume: (cm) 0.028 % Reduction in Area: 99% % Reduction in Volume: 99.9% Epithelialization: Medium (34-66%) Tunneling: No Undermining: No Wound Description Classification: Category/Stage III Wound Margin: Thickened Exudate Amount: Medium Exudate Type: Serosanguineous Exudate Color: red, brown Foul Odor After Cleansing: No Slough/Fibrino Yes Wound Bed Granulation Amount: Large (67-100%) Exposed Structure Granulation Quality: Red, Pink Fascia Exposed: No Necrotic Amount: Small (1-33%) Fat Layer (Subcutaneous Tissue) Exposed: Yes Necrotic Quality: Adherent Slough Tendon Exposed: No Muscle Exposed: No Joint Exposed: No Bone Exposed: No Periwound Skin Texture Texture Color No Abnormalities Noted: No No Abnormalities Noted: Yes Callus: Yes Temperature / Pain Scarring: Yes Temperature: No Abnormality Moisture No Abnormalities Noted: Yes Treatment Notes Wound #2 (Calcaneus) Wound Laterality: Left Cleanser Normal Saline Discharge Instruction: Cleanse the wound with  Normal Saline prior to applying a clean dressing using gauze sponges, not tissue or cotton balls. Wound Cleanser Discharge Instruction: Cleanse the wound with wound cleanser prior to applying a clean dressing using gauze sponges, not tissue or cotton balls. Peri-Wound Care Topical Primary Dressing Endoform 2x2 in Discharge Instruction: Moisten with saline or Hydrogel Secondary Dressing Optifoam Non-Adhesive Dressing, 4x4 in Discharge Instruction: Apply over primary dressing as directed. Woven Gauze Sponge, Non-Sterile 4x4 in Discharge Instruction: Apply over primary dressing as directed. Secured With 32M Medipore H Soft Cloth Surgical T ape, 4 x 10 (in/yd) Discharge Instruction: Secure with tape as directed. Compression [name], [name] ([id number]) [phone].pdf Page 7 of 7 Kerlix Roll 4.5x3.1 (in/yd) Discharge Instruction: Apply Kerlix and Coban compression as directed. Compression Stockings Add-Ons Electronic Signature(s) Signed: 09/29/2022 5:20:32 PM By: [name] Entered By: [name] on 09/29/2022 10:40:34 -------------------------------------------------------------------------------- Vitals Details Patient Name: Date of Service: [name], [place] 09/29/2022 10:15 A M Medical Record Number: [id number] Patient Account Number: [id number] Date of Birth/Sex: Treating RN: [birth year] (47 y.o. [name] Primary Care Jewelz Ricklefs: [name] Other Clinician: Referring Varnika Butz: Treating Kellee Sittner/Extender: [name] in Treatment: 115 Vital Signs Time Taken: 10:35 Temperature (F): 98.4 Height (in): 69 Pulse (bpm): 79 Weight (lbs): 280 Respiratory Rate (breaths/min): 16 Body Mass Index (BMI): 41.3 Blood Pressure (mmHg): 115/78 Reference Range: 80 - 120 mg / dl Electronic Signature(s) Signed: 09/29/2022 5:20:32 PM By: [name] Entered By: [name] on 09/29/2022 10:48:55

## 2022-10-05 DIAGNOSIS — L02821 Furuncle of head [any part, except face]: Secondary | ICD-10-CM | POA: Diagnosis not present

## 2022-10-06 ENCOUNTER — Encounter (HOSPITAL_BASED_OUTPATIENT_CLINIC_OR_DEPARTMENT_OTHER): Payer: PPO | Admitting: General Surgery

## 2022-10-06 DIAGNOSIS — L97522 Non-pressure chronic ulcer of other part of left foot with fat layer exposed: Secondary | ICD-10-CM | POA: Diagnosis not present

## 2022-10-06 DIAGNOSIS — L89623 Pressure ulcer of left heel, stage 3: Secondary | ICD-10-CM | POA: Diagnosis not present

## 2022-10-09 NOTE — Progress Notes (Signed)
[name], LINES ([name]) [name].pdf Page [name] of 15 Visit Report for 10/06/2022 Chief Complaint Document Details Patient Name: Date of Service: [name], [name] 10/06/2022 10:15 A [name] Medical Record Number: [name] Patient Account Number: [name] Date of Birth/Sex: Treating RN: [name] ([name]) Primary Care Provider: Cristie [name] Other Clinician: Referring Provider: Treating Provider/Extender: [name]: [name] Information Obtained from: Patient Chief Complaint Bilateral Plantar Foot Ulcers Electronic Signature(s) Signed: 10/06/2022 10:47:42 AM By: [name] Maudlin MD FACS Entered By: [name] on 10/06/2022 10:47:41 -------------------------------------------------------------------------------- Debridement Details Patient Name: Date of Service: [name], [name] 10/06/2022 10:15 A [name] Medical Record Number: [name] Patient Account Number: [name] Date of Birth/Sex: Treating RN: [name] ([name] Primary Care Provider: Cristie [name] Other Clinician: Referring Provider: Treating Provider/Extender: [name]: [name] Debridement Performed for Assessment: Wound #2 Left Calcaneus Performed By: Physician [name] Maudlin, MD Debridement Type: Debridement Level of Consciousness (Pre-procedure): Awake and Alert Pre-procedure Verification/Time Out Yes - 10:08 Taken: Start Time: 10:08 Pain Control: Lidocaine 4% T opical Solution T Area Debrided (L x W): otal 0.3 (cm) x 0.2 (cm) = 0.06 (cm) Tissue and other material debrided: Non-Viable, Callus, Eschar, Slough, Slough Level: Non-Viable Tissue Debridement Description: Selective/Open Wound Instrument: Curette Bleeding: Minimum Hemostasis Achieved: Pressure End Time: 10:08 Procedural Pain: 0 Post Procedural Pain: 0 Response to [name]: Procedure was tolerated well Level of Consciousness (Post-  Awake and Alert procedure): Post Debridement Measurements of Total Wound Length: (cm) 0.3 Stage: Category/Stage III Width: (cm) 0.2 Depth: (cm) 0.[name] Volume: (cm) 0.005 Character of Wound/Ulcer Post Debridement: Improved Post Procedure Diagnosis Same as Pre-procedure Notes Scribed for Dr. Celine [name] by [name] Electronic Signature(s) Signed: 10/06/2022 10:50:19 AM By: [name] Maudlin MD FACS Signed: 10/06/2022 4:53:10 PM By: [name] Catholic RN [name] ([name]) [phone].pdf Page 2 of 15 Entered By: [name] on 10/06/2022 10:11:01 -------------------------------------------------------------------------------- HPI Details Patient Name: Date of Service: [name] 10/06/2022 10:15 A [name] Medical Record Number: [name] Patient Account Number: [name] Date of Birth/Sex: Treating RN: Apr 05, 1976 (47 y.o. [name]) Primary Care Provider: Cristie [name] Other Clinician: Referring Provider: Treating Provider/Extender: [name]: [name] History of Present Illness HPI Description: Wounds are12/03/2020 upon evaluation today patient presents for initial inspection here in our clinic concerning issues he has been having with the bottoms of his feet bilaterally. He states these actually occurred as wounds when he was hospitalized for 5 months secondary to Covid. He was apparently with tilting bed where he was in an upright position quite frequently and apparently this occurred in some way shape or form during that time. Fortunately there is no sign of active infection at this time. No fevers, chills, nausea, vomiting, or diarrhea. With that being said he still has substantial wounds on the plantar aspects of his feet Theragen require quite a bit of work to get these to heal. He has been using Santyl currently though that is been problematic both in receiving the medication as well as actually paid for it as it is become  quite expensive. Prior to the experience with Covid the patient really did not have any major medical problems other than hypertension he does have some mild generalized weakness following the Covid experience. 07/22/2020 on evaluation today patient appears to be doing okay in regard to his foot ulcers I feel like the wound beds are showing signs of better improvement that I do believe  the Iodoflex is helping in this regard. With that being said he does have a lot of drainage currently and this is somewhat blue/green in nature which is consistent with Pseudomonas. I do think a culture today would be appropriate for [place] to evaluate and see if that is indeed the case I would likely start him on antibiotic orally as well he is not allergic to Cipro knows of no issues he has had in the past 12/21; patient was admitted to the clinic earlier this month with bilateral presumed pressure ulcers on the bottom of his feet apparently related to excessive pressure from a tilt table arrangement in the intensive care unit. Patient relates this to being on ECMO but I am not really sure that is exactly related to that. I must say I have never seen anything like this. He has fairly extensive full-thickness wounds extending from his heel towards his midfoot mostly centered laterally. There is already been some healing distally. He does not appear to have an arterial issue. He has been using gentamicin to the wound surfaces with Iodoflex to help with ongoing debridement [name]/6; this is a patient with pressure ulcers on the bottom of his feet related to excessive pressure from a standing position in the intensive care unit. He is complaining of a lot of pain in the right heel. He is not a diabetic. 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 We have been using Iodoflex to help prepare the surfaces of both wounds for an advanced [name] product. He is nonambulatory spending most of his time in a  wheelchair I have asked him not to propel the wheelchair with his heels [name]/13; in general his wounds look better not much surface area change we have been using Iodoflex as of last week. I did an x-ray of the right heel as the patient was complaining of pain. I had some thoughts about a stress fracture perhaps Achilles tendon problems however what it showed was erosive changes along the inferior aspect of the calcaneus he now has a MRI booked for [name]/20. [name]/20; in general his wounds continue to be better. Some improvement in the large narrow areas proximally in his foot. He is still complaining of pain in the right heel and tenderness in certain areas of this wound. His MRI is tonight. I am not just looking for osteomyelitis that was brought up on the x-ray I am wondering about stress fractures, tendon ruptures etc. He has no such findings on the left. Also noteworthy is that the patient had critical illness neuropathy and some of the discomfort may be actual improvement in nerve function I am just not sure. These wounds were initially in the setting of severe critical illness related to COVID-19. He was put in a standing position. He may have also been on pressors at the point contributing to tissue ischemia. By his description at some point these wounds were grossly necrotic extending proximally up into the Achilles part of his heel. I do not know that I have ever really seen pictures of them like this although they may exist in epic We have ordered Tri layer Oasis. I am trying to stimulate some granulation in these areas. This is of course assuming the MRI is negative for infection [name]/27; since the patient was last here he saw Dr. Juleen China of infectious disease. He is planned for vancomycin and ceftriaxone. Prior operative culture grew MSSA. Also ordered baseline lab work. He also ordered arterial studies although the ABIs  in our clinic were normal as well as his clinical exam these were normal I do not  think he needs to see vascular surgery. His ABIs at the [facility] were [name].22 in the right triphasic waveforms with a normal TBI of [name].15 on the left ABI of [name].22 with triphasic waveforms and a normal TBI of [name].08. Finally he saw Dr. Amalia Hailey who will follow him in for 2 months. At this point I do not think he felt that he needed a procedure on the right calcaneal bone. Dr. Juleen China is elected for broad-spectrum antibiotic The patient is still having pain in the right heel. He walks with a walker 2/3; wounds are generally smaller. He is tolerating his IV antibiotics. I believe this is vancomycin and ceftriaxone. We are still waiting for Oasis burn in terms of his out-of-pocket max which he should be meeting soon given the IV antibiotics, MRIs etc. I have asked him to check in on this. We are using silver collagen in the meantime the wounds look better 2/10; tolerating IV vancomycin and Rocephin. We are waiting to apply for Oasis. Although I am not really sure where he is in his out-of-pocket max. 2/17 started the first application of Oasis trilayer. Still on antibiotics. The wounds have generally look better. The area on the left has a little more surface slough requiring debridement [id number]; second application of Oasis trilayer. The wound surface granulation is generally look better. The area on the left with undermining laterally I think is come in a bit. 10/08/2020 upon evaluation today patient is here today for [employer] application #3. Fortunately he seems to be doing extremely well with regard to this and we are seeing a lot of new epithelial growth which is great news. Fortunately there is no signs of active infection at this time. 10/16/2020 upon evaluation today patient appears to be doing well with regard to his foot ulcers. Do believe the Oasis has been of benefit for him. I do not see any signs of infection right now which is great news and I think that he has a lot of new epithelial growth which is  great to see as well. The patient is very pleased to hear all of this. I do think we can proceed with the Oasis trilayer #4 today. 3/18; not as much improvement in these areas on his heels that I was hoping. I did reapply trilateral Oasis today the tissue looks healthier but not as much fill in as I was hoping. 3/25; better looking today I think this is come in a bit the tissue looks healthier. Triple layer Oasis reapplied #6 4/[name]; somewhat better looking definitely better looking surface not as much change in surface area as I was hoping. He may be spending more time [place] on days then he needs to although he does have heel offloading boots. Triple layer Oasis reapplied #7 4/7; unfortunately apparently [facility] will not approve any further Oasis which is unfortunate since the patient did respond nicely both in terms of the condition of the wound bed as well as surface area. There is still some drainage coming from the wound but not a lot there does not appear to be any LASHAN, SEGNER ([id number]) [phone].pdf Page 3 of 15 infection 4/15; we have been using Hydrofera Blue. He continues to have nice rims of epithelialization on the right greater than the left. The left the epithelialization is coming from the tip of his heel. There is moderate drainage. In this  that concerns me about a total contact cast. There is no evidence of infection 4/29; patient has been using Hydrofera Blue with dressing changes. He has no complaints or issues today. 5/5; using Hydrofera Blue. I actually think that he looks marginally better than the last time I saw this 3 weeks ago. There are rims of epithelialization on the left thumb coming from the medial side on the right. Using Hydrofera Blue 5/12; using Hydrofera Blue. These continue to make improvements in surface area. His drainage was not listed as severe I therefore went ahead and put a cast on the left foot. Right foot we  will continue to dress his previous 5/16; back for first total contact cast change. He did not tolerate this particularly well cast injury on the anterior tibia among other issues. Difficulty sleeping. I talked him about this in some detail and afterwards is elected to continue. I told him I would like to have a cast on for 3 weeks to see if this is going to help at all. I think he agreed 5/19; I think the wound is better. There is no tunneling towards his midfoot. The undermining medially also looks better. He has a rim of new skin distally. I think we are making progress here. The area on the left also continues to look somewhat better to me using Hydrofera Blue. He has a list of complaints about the cast but none of them seem serious 5/26; patient presents for [name] week follow-up. He has been using a total contact cast and tolerating this well. Hydrofera Blue is the main dressing used. He denies signs of infection. 6/2 Hydrofera Blue total contact cast on the left. These were large ulcers that formed in intensive care unit where the patient was recovering from [place]. May have had something to do with being ventilated in an upright positiono Pressors etc. We have been able to get the areas down considerably and a viable surface. There is some epithelialization in both sides. Note made of drainage 6/9; changed to [facility] last time because of drainage. He arrives with better looking surfaces and dimensions on the left than the right. Paradoxically the right actually probes more towards his midfoot the left is largely close down but both of these look improved. Using a total contact cast on the left 6/16; complex wounds on his bilateral plantar heels which were initially pressure injury from a stay in the ICU with COVID. We have been using silver alginate most recently. His dimensions of come in quite dramatically however not recently. We have been putting the left foot in a total contact  cast 6/23; complex wounds on the bilateral plantar heels. I been putting the left in the cast paradoxically the area on the right is the one that is going towards closure at a faster rate. Quite a bit of drainage on the left. The patient went to see Dr. Amalia Hailey who said he was going to standby for skin grafts. I had actually considered sending him for skin grafts however he would be mandatorily off his feet for a period of weeks to months. I am thinking that the area on the right is going to close on its own the area on the left has been more stubborn even though we have him in a total contact cast 6/30; took him out of a total contact cast last week is the right heel seem to be making better progress than the left where I was placing the cast. We are  using silver alginate. Both wounds are smaller right greater than left 7/12; both wounds look as though they are making some progress. We are using silver alginate. Heel offloading boots 7/26; very gradual progress especially on the right. Using silver alginate. He is wearing heel offloading boots 8/18; he continues to close these wounds down very gradually. Using silver alginate. The problem polymen being definitive about this is areas of what appears to be callus around the margins. This is not a 100% of the area but certainly sizable especially on the right 9/[name]; bilateral plantar feet wounds secondary to prolonged pressure while being ventilated for COVID-19 in an upright position. Essentially pressure ulcers on the bottom of his feet. He is made substantial progress using silver alginate. 9/14; bilateral plantar feet wounds secondary to prolonged pressure. Making progress using silver alginate. 9/29 bilateral plantar feet wounds secondary to prolonged pressure. I changed him to Iodoflex last week. MolecuLight showing reddened blush fluorescence 10/11; patient presents for follow-up. He has no issues or complaints today. He denies signs of infection.  He continues to use Iodoflex and antibiotic ointment to the wound beds. 10/27; 2-week follow-up. No evidence of infection. He has callus and thick dry skin around the wound margins we have been using Iodoflex and Bactroban which was in response to a moderate left MolecuLight reddish blush fluorescence. 11/10; 2-week follow-up. Wound margins again have thick callus however the measurements of the actual wound sites are a lot smaller. Everything looks reasonably healthy here. We have been using Iodoflex He was approved for prime matrix but I have elected to delay this given the improvement in the surface area. Hopefully I will not regret that decision as were getting close to the end of the year in terms of insurance payment 12/8; 2-week follow-up. Wounds are generally smaller in size. These were initially substantial wounds extending into the forefoot all the way into the heel on the bilateral plantar feet. They are now both located on the plantar heel distal aspect both of these have a lot of callus around the wounds I used a #5 curette to remove this on the right and the left also some subcutaneous debris to try and get the wound edges were using Iodoflex. He has heel offloading shoe 12/22; 2-week follow-up. Not really much improvement. He has thick callus around the outer edges of both wounds. I remove this there is some nonviable subcutaneous tissue as well. We have been using Iodoflex. Her intake nurse and myself spontaneously thought of a total contact cast I went back in May. At that time we really were not seeing much of an improvement with a cast although the wound was in a much different situation I would like to retry this in 2 weeks and I discussed this with the patient [name]/12/2021; the patient has had some improvement with the Iodoflex. The the area on the left heel plantar more improved than the right. I had to put him in a total contact cast on the left although I decided to put that off  for 2 weeks. I am going to change his primary dressing to silver collagen. I think in both areas he has had some improvement most of the healing seems to be more proximal in the heel. The wounds are in the mid aspect. A lot of thick callus on the right heel however. [name]/19; we are using silver collagen on both plantar heel areas. He has had some improvement today. The left did not require any debridement. He  still had some eschar on the right that was debrided but both seem to have contracted. I did not put it total contact cast on him today 2/2 we have been using silver collagen. The area on the right plantar heel has areas that appear to be epithelialized interspersed with dry flaking callus and dry skin. I removed this. This really looks better than on the other side. On the left still a large area with raised edges and debris on the surface. 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 He has no issues or complaints today. 2/9; not much change in the left foot wound with [name] week of a cast we are using silver collagen. Silver collagen on the right side. The right side has been the better wound surface. We will reapply the total contact cast on the left 2/16; not much improvement on either side I been using silver collagen with a total contact cast on the left. I'[name] changing the Hydrofera Blue still with a total contact cast on the left 2/23; some improvement on both sides. Disappointing that he has thick callus around the area that we are putting in a total contact cast on the left. We've been using Hydrofera Blue on both wound areas. This is a man who at essentially pressure ulcers in addition to ischemia caused by medications to support his blood pressure (pressors) in the ICU. He was being ventilated in the standing position for severe Covid. A Shiley the wounds extended  across his entire foot but are now localized to his plantar heels bilaterally. We have made progress however neither areas healed. I continue to think the total contact cast is helped albeit painstakingly slowly. He has never wanted a plastic surgery consult although I don't know that they would be interested in grafting in area in this location. 10/07/2021: Continued improvement bilaterally. There is still some callus around the left wound, despite the total contact cast. He has some increased pain in his right midfoot around [name] particular area. This has been painful in the past but seems to be a little bit worse. When his cast was removed today, there was an area on the heel of the left foot that looks a bit macerated. He is also complaining of pain in his left thigh and hip which he thinks is secondary to the limb length discrepancy caused by the cast. 10/14/2021: He continues to improve. A little bit less callus around the left wound. He continues to endorse pain in his right midfoot, but this is not as significant as it was last week. The maceration on his left heel is improved. 10/21/2021: Continued improvement to both wounds. The maceration on his left heel is no longer evident. Less callus bilaterally. Epithelialization progressing. [name], [name] ([id number]) [name].pdf Page 4 of 15 10/28/2021: Significant improvement this week. The right sided wound is nearly closed with just a small open area at the middle. No maceration seen on the left heel. Continued epithelialization on both sides. No concern for infection. 11/04/2021: T oday, the wounds were measured a little bit differently and come out as larger, but I actually think they are about the same to potentially even smaller, particularly on the left. He continues to accumulate some callus on the right. 11/11/2021: T oday, the patient is expressing some concern that the left wound, despite being in the total contact  cast,  is not progressing at the same rate as the [name] on the right. He is interested in trying a week without the cast to see how the wound does. The wounds are roughly the same size as last week, with the right perhaps being a little bit smaller. He continues to build up callus on both sites. 11/18/2021: Last week, I permitted the patient to go without his total contact cast, just to see if the cast was really making any difference. Today, both wounds have deteriorated to some extent, suggesting that the cast is providing benefit, at least on the left. Both are larger and have accumulated callus, slough, and other debris. 11/26/2021: I debrided both wounds quite aggressively last week in an effort to stimulate the healing cascade. This appears to have been effective as the left sided wound is a full centimeter shorter in length. Although the right was measured slightly larger, on inspection, it looks as though an area of epithelialized tissue was included in the measurements. We have been using PolyMem Ag on the wound surfaces with a total contact cast to the left. 12/02/2021: It appears that the intake personnel are including epithelialized tissue in his wound measurements; the right wound is almost completely epithelialized; there is just a crater at the proximal midfoot with some open areas. On the left, he has built up some callus, but the overall wound surface looks good. There is some senescent skin around the wound margin. He has been in PolyMem Ag bilaterally with a total contact cast on the left. 12/09/2021: The right wound is nearly closed; there is just a small open area at the mid calcaneus. On the left, the wound is smaller with minimal callus buildup. No significant drainage. 12/16/2021: The right calcaneal wound remains minimally open at the mid calcaneus; the rest has epithelialized. On the left, the wound is also a little bit smaller. There is some senescent tissue on the periphery. He is getting  his first application of a trial skin substitute called Vendaje today. 12/23/2021: The wound on his right calcaneus is nearly closed; there is just a small area at the most distal aspect of the calcaneus that is open. On the left, the area where we applied to the skin substitute has a healthier-looking bed of granulation tissue. The wound dimensions are not significantly different on this side but the wound surface is improved. 12/30/2021: The wound on the right calcaneus has not changed significantly aside from some accumulation of callus. On the left, the open area is smaller and continues to have an improved surface. He continues to accumulate callus around the wound. He is here for his third application of Vendaje. 6/[name]/2023: The right calcaneal wound is down to just a couple of millimeters. He continues to accumulate periwound callus. He unfortunately got his cast wet earlier in the week and his left foot is macerated, resulting in some superficial skin loss just distal to the open wound. The open wound itself, however, is much smaller and has a healthier appearing surface. He is here for his fourth application of Vendaje. 01/13/2022: The right calcaneal wound is about the same. Unfortunately, once again, his cast got wet and his foot is again macerated. This is caused the left calcaneal wound to enlarge. He is here for his fifth application of Vendaje. 01/20/2022: The right calcaneal wound is very small. There is some periwound callus accumulation. He purchased a new cast protector last week and this has been effective in avoiding the maceration  that has been occurring on the left. The left calcaneal wound is narrower and has a healthy and viable-appearing surface. He is here for his 6 application of Vendaje. 01/27/2022: The right calcaneal wound is down to just a pinhole. There is some periwound slough and callus. On the left, the wound is narrower and shorter by about a centimeter. The surface is  robust and viable-appearing. Unfortunately, the rep for the trial skin substitute product did not provide any for [place] to use today. 02/04/2022: The right calcaneal wound remains unchanged. There is more accumulated callus. On the left, although the intake nurse measured it a little bit longer, it looks about the same to me. The surface has a layer of slough, but underneath this, there is good granulation tissue. 02/10/2022: The right calcaneus wound is nearly closed. There is still some callus that builds up around the site. The left side looks about the same in terms of dimensions, but the surface is more robust and vital-appearing. 02/16/2022: The area of the right calcaneus that was nearly closed last week has closed, but there is a small opening at the mid foot where it looks like some moisture got retained and caused some reopening. The left foot wound is narrower and shallower. Both sites have a fair amount of periwound callus and eschar. 02/24/2022: The small midfoot opening on the right calcaneus is a little bit smaller today. The left foot wound is narrower and shallower. He continues to accumulate periwound callus. No concern for infection. 03/01/2022: The patient came to clinic early because he showered and got his cast wet. Fortunately, there is no significant maceration to his foot but the callus softened and it looks like the wound on his left calcaneus may be a little bit wider. The wound on his right calcaneus is just a narrow slit. Continued accumulation of periwound callus bilaterally. 8/[name]/2023: The wound on his right calcaneus is very nearly closed, just a small pinpoint opening under a bit of eschar; the left wound has come in quite a bit, as well. It is narrower and shorter than at our last visit. Still with accumulated callus and eschar bilaterally. 03/17/2022: The right calcaneal wound is healed. The left wound is smaller and the surface itself is very clean, but there is some  blue-green staining on the periwound callus, concerning for Pseudomonas aeruginosa. 03/23/2022: The right calcaneal wound remains closed. The left wound continues to contract. No further blue-green staining. Small amount of callus and slough accumulation. 03/28/2022: He came in early today because he had gotten his cast wet. On inspection, the wound itself did not get wet or macerated, just a little bit of the forefoot. The wound itself is basically unchanged. 04/07/2022: The right foot wound remains closed. The left wound is the smallest that I have seen it to date. It is narrower and shorter. It still continues to accumulate slough on the surface. 04/15/2022: There is a band of epithelium now dividing the small left plantar foot wound in 2. There is still some slough on the surface. 04/21/2022: The wound continues to narrow. Just a little bit of slough on the surface. He seems to be responding well to endoform. 04/28/2022: Continued slow contraction of the wound. There is a little slough on the surface and some periwound callus. We have been using endoform and total contact cast. 05/05/2022: The wound appears to have stalled. There is slough and some periwound eschar/callus. No concern for infection, however. 05/12/2022: Unfortunately, his right foot has reopened.  It is located at the most posterior aspect of his surgical incision. The area was noted to have drainage [name], [name] ([name]) [phone].pdf Page 5 of 15 coming from it when his padding was removed today. Underneath some callus and senescent skin, there is an opening. No purulent drainage or malodor. On the left foot, the wound is again unchanged. There is some light blue staining on the callus, but no malodor or purulent drainage. 10/13; right and left heel remanence of extensive plantar foot wounds. These are better than I remember by quite a big margin however he is still left with wounds on the left plantar  heel and the right plantar heel. Been using endoform bilaterally. A culture was done that showed apparently Pseudomonas but we are still waiting for the [facility] antibiotic to use gentamicin today. He is still very active by description I am not sure about the offloading of his noncasted right foot 10/20; both wounds right and left heel debrided not much change from last week. [employer] has arrived which is linezolid, gentamicin and ciprofloxacin we will use this with endoform. T contact cast on the left otal 06/02/2022: Both wounds are smaller today. There is still a fair amount of callus buildup around the right foot ulcer. The left is more superficial and nearly flush with the surrounding tissues. Also with slough and eschar buildup. 06/10/2022: The right sided wound appears to be nearly closed, if not completely so, although it is somewhat difficult to tell given the abnormal tissue and scarring in his foot. There is a fair amount of callus and crust accumulation. On the left, the wound looks about the same, again with callus and slough. He has an appointment next Thursday with Dr. Loel Lofty in podiatry; I am hopeful that there may be some reconstructive options available for Mr. Knott. 06/16/2022: Both wounds have some eschar and callus accumulation. The right sided wound is extremely narrow and barely open; the left is narrower than last week. There is a little bit of slough. He has his appointment in podiatry later today. 06/23/2022: The patient met with Dr. Loel Lofty last week and unfortunately, there are no reconstructive options that he believes would be helpful. He did order an MRI to evaluate for osteomyelitis and fortunately, none was seen. The left sided wound is a little bit shorter and narrower today. The right sided wound is about the same. There is callus and eschar accumulation bilaterally. 06/29/2022: Both feet have improved from last week. There is epithelium making a valiant  effort to creep across the surface on the left. The right side looks like it got a little dry and the deep crevasse in his midfoot has cracked. Both have eschar and there is some slough on the left. 07/07/2022: Both feet have improved. There is epithelium completely covering the calcaneus on the right with just a small opening in the crevasse in his midfoot. On the left, the open area of tissue is smaller but he continues to build up callus/eschar and slough. 07/15/2022: The opening in the midfoot on the right is about the same size, covered with eschar and a little bit of slough. The open portion of the left wound is narrower and shorter with a bit of slough buildup. He admits to being on his feet more than recommended. 12/14; as far as I can tell everything on the right foot is closed. There is some eschar I removed some of this I cannot identify any open wound here. As usual this will  be a very vulnerable area going forward. On the left this looks really quite healthy. I was pleasantly surprised to see how good this looked. Wound is certainly smaller and there appears to be healthy epithelialization. He has been using Promogran on the right and endoform on the left. He has been offloading the right foot with a heel offloading boot and he has a running shoe on the right foot 08/04/2022: The right foot remains closed. He has a thick cushioned insole in his sneaker. The left sided wound is smaller with just some slough and eschar accumulation. He is wearing the heel off loader on this foot. [name]/03/2023: The right foot remains closed. The left wound has narrowed further. There is some slough and eschar accumulation. [name]/18/2024: We put him in a peg assist shoe insert and as a result, he has more epithelialization of the ulcer with minimal slough and eschar accumulation. [name]/25/2024: The wound is smaller by about half this week. Still with some slough on the surface. The peg assist shoe seems to be doing a  remarkable job of adequately offloading the site. 2/[name]/2024: There is a little bit more epithelium coming in. There is some slough and callus buildup. 09/16/2022: The wound measures about the same size, but the epithelium that has grown in looks more robust and stronger. There is some slough and callus buildup. 09/23/2022: The wound remains about the same size. The skin edges are looking rather senescent. 09/29/2022: The aggressive debridement I performed last week seems to have been effective. The wound is smaller and has significantly less slough accumulation. 10/06/2022: There has been quite a bit of epithelialization since last week. There are still some open areas with slough accumulation. There is some callus buildup around the perimeter. Electronic Signature(s) Signed: 10/06/2022 10:48:17 AM By: [name] Maudlin MD FACS Entered By: [name] on 10/06/2022 10:48:16 -------------------------------------------------------------------------------- Physical Exam Details Patient Name: Date of Service: [name], [name] 10/06/2022 10:15 A [name] Medical Record Number: [name] Patient Account Number: [name] Date of Birth/Sex: Treating RN: [birth year] (47 y.o. [name]) Primary Care Provider: Cristie [name] Other Clinician: Referring Provider: Treating Provider/Extender: [name]: [name] Constitutional . . . . no acute distress. Respiratory Normal work of breathing on room air. [name], [name] ([name]) [name].pdf Page 6 of 15 Notes 10/06/2022: There has been quite a bit of epithelialization since last week. There are still some open areas with slough accumulation. There is some callus buildup around the perimeter. Electronic Signature(s) Signed: 10/06/2022 10:48:43 AM By: [name] Maudlin MD FACS Entered By: [name] on 10/06/2022  10:48:43 -------------------------------------------------------------------------------- Physician Orders Details Patient Name: Date of Service: [name], [name] 10/06/2022 10:15 A [name] Medical Record Number: [name] Patient Account Number: [name] Date of Birth/Sex: Treating RN: 1975-12-21 (47 y.o. [name] Primary Care Provider: Cristie [name] Other Clinician: Referring Provider: Treating Provider/Extender: [name]: [phone] Verbal / Phone Orders: No Diagnosis Coding ICD-10 Coding Code Description [phone] Non-pressure chronic ulcer of other part of left foot with fat layer exposed Follow-up Appointments ppointment in [name] week. - Dr. Celine [name] Room 2 Return A Anesthetic Wound #2 Left Calcaneus (In clinic) Topical Lidocaine 4% applied to wound bed - In clinic Bathing/ Shower/ Hygiene May shower and wash wound with soap and water. Edema Control - Lymphedema / SCD / Other Bilateral Lower Extremities Avoid standing for long periods of time. Moisturize legs daily. - as needed Off-Loading Other: - keep pressure off of  the bottom of your feet. Elevate legs throughout the day. Use the Shoe with the PegAssist off-loading insole Additional Orders / Instructions Follow Nutritious Diet - Try to get 70-100 grams of Protein a day+ Wound [name] Wound #2 - Calcaneus Wound Laterality: Left Cleanser: Normal Saline (Generic) Every Other Day/30 Days Discharge Instructions: Cleanse the wound with Normal Saline prior to applying a clean dressing using gauze sponges, not tissue or cotton balls. Cleanser: Wound Cleanser (Generic) Every Other Day/30 Days Discharge Instructions: Cleanse the wound with wound cleanser prior to applying a clean dressing using gauze sponges, not tissue or cotton balls. Prim Dressing: Endoform 2x2 in (Generic) Every Other Day/30 Days ary Discharge Instructions: Moisten with saline or Hydrogel Secondary Dressing: Optifoam  Non-Adhesive Dressing, 4x4 in (Generic) Every Other Day/30 Days Discharge Instructions: Apply over primary dressing as directed. Secondary Dressing: Woven Gauze Sponge, Non-Sterile 4x4 in (Generic) Every Other Day/30 Days Discharge Instructions: Apply over primary dressing as directed. Secured With: 55M Medipore H Soft Cloth Surgical T ape, 4 x 10 (in/yd) (Generic) Every Other Day/30 Days Discharge Instructions: Secure with tape as directed. Compression Wrap: Kerlix Roll 4.5x3.[name] (in/yd) Every Other Day/30 Days Discharge Instructions: Apply Kerlix and Coban compression as directed. WLLIAM, PASINI ([id number]) [name].pdf Page 7 of 15 Electronic Signature(s) Signed: 10/06/2022 10:50:19 AM By: [name] Maudlin MD FACS Entered By: [name] on 10/06/2022 10:48:55 -------------------------------------------------------------------------------- Problem List Details Patient Name: Date of Service: [name], [name] 10/06/2022 10:15 A [name] Medical Record Number: [id number] Patient Account Number: [name] Date of Birth/Sex: Treating RN: 1976/03/09 (47 y.o. [name]) Primary Care Provider: Cristie [name] Other Clinician: Referring Provider: Treating Provider/Extender: [name]: [phone] Active Problems ICD-10 Encounter Code Description Active Date MDM Diagnosis L97.522 Non-pressure chronic ulcer of other part of left foot with fat layer exposed [name]/27/2022 No Yes Inactive Problems ICD-10 Code Description Active Date Inactive Date L97.512 Non-pressure chronic ulcer of other part of right foot with fat layer exposed [name]/27/2022 [name]/27/2022 L89.893 Pressure ulcer of other site, stage 3 07/15/2020 07/15/2020 M62.81 Muscle weakness (generalized) 07/15/2020 07/15/2020 I10 Essential (primary) hypertension 07/15/2020 07/15/2020 M86.171 Other acute osteomyelitis, right ankle and foot [name]/27/2022 [name]/27/2022 Resolved Problems Electronic Signature(s) Signed:  10/06/2022 10:47:28 AM By: [name] Maudlin MD FACS Entered By: [name] on 10/06/2022 10:47:27 -------------------------------------------------------------------------------- Progress Note Details Patient Name: Date of Service: [name], [name] Ridge. 10/06/2022 10:15 A [name] Medical Record Number: [id number] Patient Account Number: [name] Date of Birth/Sex: Treating RN: 1976/04/27 (47 y.o. [name]) Primary Care Provider: Cristie [name] Other Clinician: Referring Provider: Treating Provider/Extender: [name]: [name] Subjective Chief Complaint Information obtained from Patient Bilateral Plantar Foot Ulcers History of Present Illness (HPI) Wounds are12/03/2020 upon evaluation today patient presents for initial inspection here in our clinic concerning issues he has been having with the bottoms of [name], [name] ([id number]) [phone].pdf Page 8 of 15 his feet bilaterally. He states these actually occurred as wounds when he was hospitalized for 5 months secondary to Covid. He was apparently with tilting bed where he was in an upright position quite frequently and apparently this occurred in some way shape or form during that time. Fortunately there is no sign of active infection at this time. No fevers, chills, nausea, vomiting, or diarrhea. With that being said he still has substantial wounds on the plantar aspects of his feet Theragen require quite a bit of work to get these to heal. He has been using Santyl currently though that  is been problematic both in receiving the medication as well as actually paid for it as it is become quite expensive. Prior to the experience with Covid the patient really did not have any major medical problems other than hypertension he does have some mild generalized weakness following the Covid experience. 07/22/2020 on evaluation today patient appears to be doing okay in regard to his foot ulcers I feel like  the wound beds are showing signs of better improvement that I do believe the Iodoflex is helping in this regard. With that being said he does have a lot of drainage currently and this is somewhat blue/green in nature which is consistent with Pseudomonas. I do think a culture today would be appropriate for [place] to evaluate and see if that is indeed the case I would likely start him on antibiotic orally as well he is not allergic to Cipro knows of no issues he has had in the past 12/21; patient was admitted to the clinic earlier this month with bilateral presumed pressure ulcers on the bottom of his feet apparently related to excessive pressure from a tilt table arrangement in the intensive care unit. Patient relates this to being on ECMO but I am not really sure that is exactly related to that. I must say I have never seen anything like this. He has fairly extensive full-thickness wounds extending from his heel towards his midfoot mostly centered laterally. There is already been some healing distally. He does not appear to have an arterial issue. He has been using gentamicin to the wound surfaces with Iodoflex to help with ongoing debridement [name]/6; this is a patient with pressure ulcers on the bottom of his feet related to excessive pressure from a standing position in the intensive care unit. He is complaining of a lot of pain in the right heel. He is not a diabetic. 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 We have been using Iodoflex to help prepare the surfaces of both wounds for an advanced [name] product. He is nonambulatory spending most of his time in a wheelchair I have asked him not to propel the wheelchair with his heels [name]/13; in general his wounds look better not much surface area change we have been using Iodoflex as of last week. I did an x-ray of the right heel as the patient was complaining of pain. I had some thoughts about a stress fracture perhaps Achilles  tendon problems however what it showed was erosive changes along the inferior aspect of the calcaneus he now has a MRI booked for [name]/20. [name]/20; in general his wounds continue to be better. Some improvement in the large narrow areas proximally in his foot. He is still complaining of pain in the right heel and tenderness in certain areas of this wound. His MRI is tonight. I am not just looking for osteomyelitis that was brought up on the x-ray I am wondering about stress fractures, tendon ruptures etc. He has no such findings on the left. Also noteworthy is that the patient had critical illness neuropathy and some of the discomfort may be actual improvement in nerve function I am just not sure. These wounds were initially in the setting of severe critical illness related to COVID-19. He was put in a standing position. He may have also been on pressors at the point contributing to tissue ischemia. By his description at some point these wounds were grossly necrotic extending proximally up into the Achilles part of his heel. I do not  know that I have ever really seen pictures of them like this although they may exist in epic We have ordered Tri layer Oasis. I am trying to stimulate some granulation in these areas. This is of course assuming the MRI is negative for infection [name]/27; since the patient was last here he saw Dr. Juleen China of infectious disease. He is planned for vancomycin and ceftriaxone. Prior operative culture grew MSSA. Also ordered baseline lab work. He also ordered arterial studies although the ABIs in our clinic were normal as well as his clinical exam these were normal I do not think he needs to see vascular surgery. His ABIs at the [facility] were [name].22 in the right triphasic waveforms with a normal TBI of [name].15 on the left ABI of [name].22 with triphasic waveforms and a normal TBI of [name].08. Finally he saw Dr. Amalia Hailey who will follow him in for 2 months. At this point I do not think he felt that he needed a  procedure on the right calcaneal bone. Dr. Juleen China is elected for broad-spectrum antibiotic The patient is still having pain in the right heel. He walks with a walker 2/3; wounds are generally smaller. He is tolerating his IV antibiotics. I believe this is vancomycin and ceftriaxone. We are still waiting for Oasis burn in terms of his out-of-pocket max which he should be meeting soon given the IV antibiotics, MRIs etc. I have asked him to check in on this. We are using silver collagen in the meantime the wounds look better 2/10; tolerating IV vancomycin and Rocephin. We are waiting to apply for Oasis. Although I am not really sure where he is in his out-of-pocket max. 2/17 started the first application of Oasis trilayer. Still on antibiotics. The wounds have generally look better. The area on the left has a little more surface slough requiring debridement [id number]; second application of Oasis trilayer. The wound surface granulation is generally look better. The area on the left with undermining laterally I think is come in a bit. 10/08/2020 upon evaluation today patient is here today for [employer] application #3. Fortunately he seems to be doing extremely well with regard to this and we are seeing a lot of new epithelial growth which is great news. Fortunately there is no signs of active infection at this time. 10/16/2020 upon evaluation today patient appears to be doing well with regard to his foot ulcers. Do believe the Oasis has been of benefit for him. I do not see any signs of infection right now which is great news and I think that he has a lot of new epithelial growth which is great to see as well. The patient is very pleased to hear all of this. I do think we can proceed with the Oasis trilayer #4 today. 3/18; not as much improvement in these areas on his heels that I was hoping. I did reapply trilateral Oasis today the tissue looks healthier but not as much fill in as I was hoping. 3/25;  better looking today I think this is come in a bit the tissue looks healthier. Triple layer Oasis reapplied #6 4/[name]; somewhat better looking definitely better looking surface not as much change in surface area as I was hoping. He may be spending more time [place] on days then he needs to although he does have heel offloading boots. Triple layer Oasis reapplied #7 4/7; unfortunately apparently [facility] will not approve any further Oasis which is unfortunate since the patient did respond nicely both  in terms of the condition of the wound bed as well as surface area. There is still some drainage coming from the wound but not a lot there does not appear to be any infection 4/15; we have been using Hydrofera Blue. He continues to have nice rims of epithelialization on the right greater than the left. The left the epithelialization is coming from the tip of his heel. There is moderate drainage. In this that concerns me about a total contact cast. There is no evidence of infection 4/29; patient has been using Hydrofera Blue with dressing changes. He has no complaints or issues today. 5/5; using Hydrofera Blue. I actually think that he looks marginally better than the last time I saw this 3 weeks ago. There are rims of epithelialization on the left thumb coming from the medial side on the right. Using Hydrofera Blue 5/12; using Hydrofera Blue. These continue to make improvements in surface area. His drainage was not listed as severe I therefore went ahead and put a cast on the left foot. Right foot we will continue to dress his previous 5/16; back for first total contact cast change. He did not tolerate this particularly well cast injury on the anterior tibia among other issues. Difficulty sleeping. I talked him about this in some detail and afterwards is elected to continue. I told him I would like to have a cast on for 3 weeks to see if this is going to help at all. I think he agreed 5/19; I  think the wound is better. There is no tunneling towards his midfoot. The undermining medially also looks better. He has a rim of new skin distally. I think we are making progress here. The area on the left also continues to look somewhat better to me using Hydrofera Blue. He has a list of complaints about the cast but none of them seem serious 5/26; patient presents for [name] week follow-up. He has been using a total contact cast and tolerating this well. Hydrofera Blue is the main dressing used. He denies signs of infection. 6/2 Hydrofera Blue total contact cast on the left. These were large ulcers that formed in intensive care unit where the patient was recovering from [place]. May have had something to do with being ventilated in an upright positiono Pressors etc. We have been able to get the areas down considerably and a viable surface. There is some epithelialization in both sides. Note made of drainage 6/9; changed to [facility] last time because of drainage. He arrives with better looking surfaces and dimensions on the left than the right. Paradoxically the right actually probes more towards his midfoot the left is largely close down but both of these look improved. Using a total contact cast on the left 6/16; complex wounds on his bilateral plantar heels which were initially pressure injury from a stay in the ICU with COVID. We have been using silver alginate ONEIL, MELMAN ([id number]) [phone].pdf Page 9 of 15 most recently. His dimensions of come in quite dramatically however not recently. We have been putting the left foot in a total contact cast 6/23; complex wounds on the bilateral plantar heels. I been putting the left in the cast paradoxically the area on the right is the one that is going towards closure at a faster rate. Quite a bit of drainage on the left. The patient went to see Dr. Amalia Hailey who said he was going to standby for skin grafts. I had actually  considered sending him for  skin grafts however he would be mandatorily off his feet for a period of weeks to months. I am thinking that the area on the right is going to close on its own the area on the left has been more stubborn even though we have him in a total contact cast 6/30; took him out of a total contact cast last week is the right heel seem to be making better progress than the left where I was placing the cast. We are using silver alginate. Both wounds are smaller right greater than left 7/12; both wounds look as though they are making some progress. We are using silver alginate. Heel offloading boots 7/26; very gradual progress especially on the right. Using silver alginate. He is wearing heel offloading boots 8/18; he continues to close these wounds down very gradually. Using silver alginate. The problem polymen being definitive about this is areas of what appears to be callus around the margins. This is not a 100% of the area but certainly sizable especially on the right 9/[name]; bilateral plantar feet wounds secondary to prolonged pressure while being ventilated for COVID-19 in an upright position. Essentially pressure ulcers on the bottom of his feet. He is made substantial progress using silver alginate. 9/14; bilateral plantar feet wounds secondary to prolonged pressure. Making progress using silver alginate. 9/29 bilateral plantar feet wounds secondary to prolonged pressure. I changed him to Iodoflex last week. MolecuLight showing reddened blush fluorescence 10/11; patient presents for follow-up. He has no issues or complaints today. He denies signs of infection. He continues to use Iodoflex and antibiotic ointment to the wound beds. 10/27; 2-week follow-up. No evidence of infection. He has callus and thick dry skin around the wound margins we have been using Iodoflex and Bactroban which was in response to a moderate left MolecuLight reddish blush fluorescence. 11/10; 2-week  follow-up. Wound margins again have thick callus however the measurements of the actual wound sites are a lot smaller. Everything looks reasonably healthy here. We have been using Iodoflex He was approved for prime matrix but I have elected to delay this given the improvement in the surface area. Hopefully I will not regret that decision as were getting close to the end of the year in terms of insurance payment 12/8; 2-week follow-up. Wounds are generally smaller in size. These were initially substantial wounds extending into the forefoot all the way into the heel on the bilateral plantar feet. They are now both located on the plantar heel distal aspect both of these have a lot of callus around the wounds I used a #5 curette to remove this on the right and the left also some subcutaneous debris to try and get the wound edges were using Iodoflex. He has heel offloading shoe 12/22; 2-week follow-up. Not really much improvement. He has thick callus around the outer edges of both wounds. I remove this there is some nonviable subcutaneous tissue as well. We have been using Iodoflex. Her intake nurse and myself spontaneously thought of a total contact cast I went back in May. At that time we really were not seeing much of an improvement with a cast although the wound was in a much different situation I would like to retry this in 2 weeks and I discussed this with the patient [name]/12/2021; the patient has had some improvement with the Iodoflex. The the area on the left heel plantar more improved than the right. I had to put him in a total contact cast on the left although I  decided to put that off for 2 weeks. I am going to change his primary dressing to silver collagen. I think in both areas he has had some improvement most of the healing seems to be more proximal in the heel. The wounds are in the mid aspect. A lot of thick callus on the right heel however. [name]/19; we are using silver collagen on both plantar  heel areas. He has had some improvement today. The left did not require any debridement. He still had some eschar on the right that was debrided but both seem to have contracted. I did not put it total contact cast on him today 2/2 we have been using silver collagen. The area on the right plantar heel has areas that appear to be epithelialized interspersed with dry flaking callus and dry skin. I removed this. This really looks better than on the other side. On the left still a large area with raised edges and debris on the surface. 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 He has no issues or complaints today. 2/9; not much change in the left foot wound with [name] week of a cast we are using silver collagen. Silver collagen on the right side. The right side has been the better wound surface. We will reapply the total contact cast on the left 2/16; not much improvement on either side I been using silver collagen with a total contact cast on the left. I'[name] changing the Hydrofera Blue still with a total contact cast on the left 2/23; some improvement on both sides. Disappointing that he has thick callus around the area that we are putting in a total contact cast on the left. We've been using Hydrofera Blue on both wound areas. This is a man who at essentially pressure ulcers in addition to ischemia caused by medications to support his blood pressure (pressors) in the ICU. He was being ventilated in the standing position for severe Covid. A Shiley the wounds extended across his entire foot but are now localized to his plantar heels bilaterally. We have made progress however neither areas healed. I continue to think the total contact cast is helped albeit painstakingly slowly. He has never wanted a plastic surgery consult although I don't know that they would be interested in  grafting in area in this location. 10/07/2021: Continued improvement bilaterally. There is still some callus around the left wound, despite the total contact cast. He has some increased pain in his right midfoot around [name] particular area. This has been painful in the past but seems to be a little bit worse. When his cast was removed today, there was an area on the heel of the left foot that looks a bit macerated. He is also complaining of pain in his left thigh and hip which he thinks is secondary to the limb length discrepancy caused by the cast. 10/14/2021: He continues to improve. A little bit less callus around the left wound. He continues to endorse pain in his right midfoot, but this is not as significant as it was last week. The maceration on his left heel is improved. 10/21/2021: Continued improvement to both wounds. The maceration on his left heel is no longer evident. Less callus bilaterally. Epithelialization progressing. 10/28/2021: Significant improvement this week. The right sided wound is nearly closed with just a small open area at the middle. No maceration  seen on the left heel. Continued epithelialization on both sides. No concern for infection. 11/04/2021: T oday, the wounds were measured a little bit differently and come out as larger, but I actually think they are about the same to potentially even smaller, particularly on the left. He continues to accumulate some callus on the right. 11/11/2021: T oday, the patient is expressing some concern that the left wound, despite being in the total contact cast, is not progressing at the same rate as the [name] on the right. He is interested in trying a week without the cast to see how the wound does. The wounds are roughly the same size as last week, with the right perhaps being a little bit smaller. He continues to build up callus on both sites. 11/18/2021: Last week, I permitted the patient to go without his total contact cast, just to see if the cast  was really making any difference. Today, both wounds have deteriorated to some extent, suggesting that the cast is providing benefit, at least on the left. Both are larger and have accumulated callus, slough, and other debris. 11/26/2021: I debrided both wounds quite aggressively last week in an effort to stimulate the healing cascade. This appears to have been effective as the left sided wound is a full centimeter shorter in length. Although the right was measured slightly larger, on inspection, it looks as though an area of epithelialized tissue was included in the measurements. We have been using PolyMem Ag on the wound surfaces with a total contact cast to the left. 12/02/2021: It appears that the intake personnel are including epithelialized tissue in his wound measurements; the right wound is almost completely epithelialized; there is just a crater at the proximal midfoot with some open areas. On the left, he has built up some callus, but the overall wound surface looks good. There is some senescent skin around the wound margin. He has been in PolyMem Ag bilaterally with a total contact cast on the left. DIONTAY, VOLLBRECHT ([name]) [name].pdf Page 10 of 15 12/09/2021: The right wound is nearly closed; there is just a small open area at the mid calcaneus. On the left, the wound is smaller with minimal callus buildup. No significant drainage. 12/16/2021: The right calcaneal wound remains minimally open at the mid calcaneus; the rest has epithelialized. On the left, the wound is also a little bit smaller. There is some senescent tissue on the periphery. He is getting his first application of a trial skin substitute called Vendaje today. 12/23/2021: The wound on his right calcaneus is nearly closed; there is just a small area at the most distal aspect of the calcaneus that is open. On the left, the area where we applied to the skin substitute has a healthier-looking bed of  granulation tissue. The wound dimensions are not significantly different on this side but the wound surface is improved. 12/30/2021: The wound on the right calcaneus has not changed significantly aside from some accumulation of callus. On the left, the open area is smaller and continues to have an improved surface. He continues to accumulate callus around the wound. He is here for his third application of Vendaje. 6/[name]/2023: The right calcaneal wound is down to just a couple of millimeters. He continues to accumulate periwound callus. He unfortunately got his cast wet earlier in the week and his left foot is macerated, resulting in some superficial skin loss just distal to the open wound. The open wound itself, however, is much smaller and has  a healthier appearing surface. He is here for his fourth application of Vendaje. 01/13/2022: The right calcaneal wound is about the same. Unfortunately, once again, his cast got wet and his foot is again macerated. This is caused the left calcaneal wound to enlarge. He is here for his fifth application of Vendaje. 01/20/2022: The right calcaneal wound is very small. There is some periwound callus accumulation. He purchased a new cast protector last week and this has been effective in avoiding the maceration that has been occurring on the left. The left calcaneal wound is narrower and has a healthy and viable-appearing surface. He is here for his 6 application of Vendaje. 01/27/2022: The right calcaneal wound is down to just a pinhole. There is some periwound slough and callus. On the left, the wound is narrower and shorter by about a centimeter. The surface is robust and viable-appearing. Unfortunately, the rep for the trial skin substitute product did not provide any for [place] to use today. 02/04/2022: The right calcaneal wound remains unchanged. There is more accumulated callus. On the left, although the intake nurse measured it a little bit longer, it looks about the  same to me. The surface has a layer of slough, but underneath this, there is good granulation tissue. 02/10/2022: The right calcaneus wound is nearly closed. There is still some callus that builds up around the site. The left side looks about the same in terms of dimensions, but the surface is more robust and vital-appearing. 02/16/2022: The area of the right calcaneus that was nearly closed last week has closed, but there is a small opening at the mid foot where it looks like some moisture got retained and caused some reopening. The left foot wound is narrower and shallower. Both sites have a fair amount of periwound callus and eschar. 02/24/2022: The small midfoot opening on the right calcaneus is a little bit smaller today. The left foot wound is narrower and shallower. He continues to accumulate periwound callus. No concern for infection. 03/01/2022: The patient came to clinic early because he showered and got his cast wet. Fortunately, there is no significant maceration to his foot but the callus softened and it looks like the wound on his left calcaneus may be a little bit wider. The wound on his right calcaneus is just a narrow slit. Continued accumulation of periwound callus bilaterally. 8/[name]/2023: The wound on his right calcaneus is very nearly closed, just a small pinpoint opening under a bit of eschar; the left wound has come in quite a bit, as well. It is narrower and shorter than at our last visit. Still with accumulated callus and eschar bilaterally. 03/17/2022: The right calcaneal wound is healed. The left wound is smaller and the surface itself is very clean, but there is some blue-green staining on the periwound callus, concerning for Pseudomonas aeruginosa. 03/23/2022: The right calcaneal wound remains closed. The left wound continues to contract. No further blue-green staining. Small amount of callus and slough accumulation. 03/28/2022: He came in early today because he had gotten his cast  wet. On inspection, the wound itself did not get wet or macerated, just a little bit of the forefoot. The wound itself is basically unchanged. 04/07/2022: The right foot wound remains closed. The left wound is the smallest that I have seen it to date. It is narrower and shorter. It still continues to accumulate slough on the surface. 04/15/2022: There is a band of epithelium now dividing the small left plantar foot wound in 2. There  is still some slough on the surface. 04/21/2022: The wound continues to narrow. Just a little bit of slough on the surface. He seems to be responding well to endoform. 04/28/2022: Continued slow contraction of the wound. There is a little slough on the surface and some periwound callus. We have been using endoform and total contact cast. 05/05/2022: The wound appears to have stalled. There is slough and some periwound eschar/callus. No concern for infection, however. 05/12/2022: Unfortunately, his right foot has reopened. It is located at the most posterior aspect of his surgical incision. The area was noted to have drainage coming from it when his padding was removed today. Underneath some callus and senescent skin, there is an opening. No purulent drainage or malodor. On the left foot, the wound is again unchanged. There is some light blue staining on the callus, but no malodor or purulent drainage. 10/13; right and left heel remanence of extensive plantar foot wounds. These are better than I remember by quite a big margin however he is still left with wounds on the left plantar heel and the right plantar heel. Been using endoform bilaterally. A culture was done that showed apparently Pseudomonas but we are still waiting for the [facility] antibiotic to use gentamicin today. He is still very active by description I am not sure about the offloading of his noncasted right foot 10/20; both wounds right and left heel debrided not much change from last week. [employer] has arrived  which is linezolid, gentamicin and ciprofloxacin we will use this with endoform. T contact cast on the left otal 06/02/2022: Both wounds are smaller today. There is still a fair amount of callus buildup around the right foot ulcer. The left is more superficial and nearly flush with the surrounding tissues. Also with slough and eschar buildup. 06/10/2022: The right sided wound appears to be nearly closed, if not completely so, although it is somewhat difficult to tell given the abnormal tissue and scarring in his foot. There is a fair amount of callus and crust accumulation. On the left, the wound looks about the same, again with callus and slough. He has an appointment next Thursday with Dr. Loel Lofty in podiatry; I am hopeful that there may be some reconstructive options available for Mr. Sayson. 06/16/2022: Both wounds have some eschar and callus accumulation. The right sided wound is extremely narrow and barely open; the left is narrower than last week. There is a little bit of slough. He has his appointment in podiatry later today. 06/23/2022: The patient met with Dr. Loel Lofty last week and unfortunately, there are no reconstructive options that he believes would be helpful. He did order an MRI to evaluate for osteomyelitis and fortunately, none was seen. The left sided wound is a little bit shorter and narrower today. The right sided wound is LYNCH, GUCKERT ([name]) [phone].pdf Page 11 of 15 about the same. There is callus and eschar accumulation bilaterally. 06/29/2022: Both feet have improved from last week. There is epithelium making a valiant effort to creep across the surface on the left. The right side looks like it got a little dry and the deep crevasse in his midfoot has cracked. Both have eschar and there is some slough on the left. 07/07/2022: Both feet have improved. There is epithelium completely covering the calcaneus on the right with just a small  opening in the crevasse in his midfoot. On the left, the open area of tissue is smaller but he continues to build up callus/eschar and slough.  07/15/2022: The opening in the midfoot on the right is about the same size, covered with eschar and a little bit of slough. The open portion of the left wound is narrower and shorter with a bit of slough buildup. He admits to being on his feet more than recommended. 12/14; as far as I can tell everything on the right foot is closed. There is some eschar I removed some of this I cannot identify any open wound here. As usual this will be a very vulnerable area going forward. On the left this looks really quite healthy. I was pleasantly surprised to see how good this looked. Wound is certainly smaller and there appears to be healthy epithelialization. He has been using Promogran on the right and endoform on the left. He has been offloading the right foot with a heel offloading boot and he has a running shoe on the right foot 08/04/2022: The right foot remains closed. He has a thick cushioned insole in his sneaker. The left sided wound is smaller with just some slough and eschar accumulation. He is wearing the heel off loader on this foot. [name]/03/2023: The right foot remains closed. The left wound has narrowed further. There is some slough and eschar accumulation. [name]/18/2024: We put him in a peg assist shoe insert and as a result, he has more epithelialization of the ulcer with minimal slough and eschar accumulation. [name]/25/2024: The wound is smaller by about half this week. Still with some slough on the surface. The peg assist shoe seems to be doing a remarkable job of adequately offloading the site. 2/[name]/2024: There is a little bit more epithelium coming in. There is some slough and callus buildup. 09/16/2022: The wound measures about the same size, but the epithelium that has grown in looks more robust and stronger. There is some slough and callus buildup. 09/23/2022:  The wound remains about the same size. The skin edges are looking rather senescent. 09/29/2022: The aggressive debridement I performed last week seems to have been effective. The wound is smaller and has significantly less slough accumulation. 10/06/2022: There has been quite a bit of epithelialization since last week. There are still some open areas with slough accumulation. There is some callus buildup around the perimeter. Patient History Information obtained from Patient. Family History Cancer - Maternal Grandparents, Diabetes - Father,Paternal Grandparents, Heart Disease - Maternal Grandparents, Hypertension - Father,Paternal Grandparents, Lung Disease - Siblings, No family history of Hereditary Spherocytosis, Kidney Disease, Seizures, Stroke, Thyroid Problems, Tuberculosis. Social History Never smoker, Marital Status - Married, Alcohol Use - Never, Drug Use - No History, Caffeine Use - Daily - tea, soda. Medical History Eyes Denies history of Cataracts, Glaucoma, Optic Neuritis Ear/Nose/Mouth/Throat Denies history of Chronic sinus problems/congestion, Middle ear problems Hematologic/Lymphatic Denies history of Anemia, Hemophilia, Human Immunodeficiency Virus, Lymphedema, Sickle Cell Disease Respiratory Patient has history of Asthma Denies history of Aspiration, Chronic Obstructive Pulmonary Disease (COPD), Pneumothorax, Sleep Apnea, Tuberculosis Cardiovascular Patient has history of Angina - with COVID, Hypertension Denies history of Arrhythmia, Congestive Heart Failure, Coronary Artery Disease, Deep Vein Thrombosis, Hypotension, Myocardial Infarction, Peripheral Arterial Disease, Peripheral Venous Disease, Phlebitis, Vasculitis Gastrointestinal Denies history of Cirrhosis , Colitis, Crohnoos, Hepatitis A, Hepatitis B, Hepatitis C Endocrine Denies history of Type I Diabetes, Type II Diabetes Genitourinary Denies history of End Stage Renal Disease Immunological Denies history of  Lupus Erythematosus, Raynaudoos, Scleroderma Integumentary (Skin) Denies history of History of Burn Musculoskeletal Denies history of Gout, Rheumatoid Arthritis, Osteoarthritis, Osteomyelitis Neurologic Denies history of Dementia, Neuropathy,  Quadriplegia, Paraplegia, Seizure Disorder Oncologic Denies history of Received Chemotherapy, Received Radiation Psychiatric Denies history of Anorexia/bulimia, Confinement Anxiety Hospitalization/Surgery History - COVID PNA 07/22/2019- 11/14/2019. - 03/27/2020 wound debridement/ skin graft. Medical A Surgical History Notes nd Constitutional Symptoms (General Health) COVID PNA 07/22/2019-11/14/2019 VENT ECMO, foot drop left foot , WAINE, MAJCHRZAK ([name]) [phone].pdf Page 12 of 15 kidney stone Psychiatric anxiety Objective Constitutional no acute distress. Vitals Time Taken: 9:52 AM, Height: 69 in, Weight: 280 lbs, BMI: 41.3, Temperature: 98.[name] F, Pulse: 90 bpm, Respiratory Rate: 20 breaths/min, Blood Pressure: 108/55 mmHg. Respiratory Normal work of breathing on room air. General Notes: 10/06/2022: There has been quite a bit of epithelialization since last week. There are still some open areas with slough accumulation. There is some callus buildup around the perimeter. Integumentary (Hair, Skin) Wound #2 status is Open. Original cause of wound was Pressure Injury. The date acquired was: 3/[name]/2021. The wound has been in [name] [name] weeks. The wound is located on the Left Calcaneus. The wound measures 0.3cm length x 0.2cm width x 0.1cm depth; 0.047cm^2 area and 0.005cm^3 volume. There is Fat Layer (Subcutaneous Tissue) exposed. There is no tunneling or undermining noted. There is a medium amount of serosanguineous drainage noted. The wound margin is thickened. There is large (67-100%) red, pink granulation within the wound bed. There is a small ([name]-33%) amount of necrotic tissue within the wound bed  including Adherent Slough. The periwound skin appearance had no abnormalities noted for moisture. The periwound skin appearance had no abnormalities noted for color. The periwound skin appearance exhibited: Callus, Scarring. Periwound temperature was noted as No Abnormality. Assessment Active Problems ICD-10 Non-pressure chronic ulcer of other part of left foot with fat layer exposed Procedures Wound #2 Pre-procedure diagnosis of Wound #2 is a Pressure Ulcer located on the Left Calcaneus . There was a Selective/Open Wound Non-Viable Tissue Debridement with a total area of 0.06 sq cm performed by [name] Maudlin, MD. With the following instrument(s): Curette to remove Non-Viable tissue/material. Material removed includes Eschar, Callus, and Slough after achieving pain control using Lidocaine 4% T opical Solution. No specimens were taken. A time out was conducted at 10:08, prior to the start of the procedure. A Minimum amount of bleeding was controlled with Pressure. The procedure was tolerated well with a pain level of 0 throughout and a pain level of 0 following the procedure. Post Debridement Measurements: 0.3cm length x 0.2cm width x 0.1cm depth; 0.005cm^3 volume. Post debridement Stage noted as Category/Stage III. Character of Wound/Ulcer Post Debridement is improved. Post procedure Diagnosis Wound #2: Same as Pre-Procedure General Notes: Scribed for Dr. Celine [name] by [name] Plan Follow-up Appointments: Return Appointment in [name] week. - Dr. Celine [name] Room 2 Anesthetic: Wound #2 Left Calcaneus: (In clinic) Topical Lidocaine 4% applied to wound bed - In clinic Bathing/ Shower/ Hygiene: May shower and wash wound with soap and water. Edema Control - Lymphedema / SCD / Other: Avoid standing for long periods of time. Moisturize legs daily. - as needed Off-Loading: Other: - keep pressure off of the bottom of your feet. Elevate legs throughout the day. Use the Shoe with the PegAssist off-loading  insole Additional Orders / Instructions: Follow Nutritious Diet - Try to get 70-100 grams of Protein a day+ WOUND #2: - Calcaneus Wound Laterality: Left ELLIOT, TWITE ([name]) [phone].pdf Page 13 of 15 Cleanser: Normal Saline (Generic) Every Other Day/30 Days Discharge Instructions: Cleanse the wound with Normal Saline prior to applying a clean dressing using gauze sponges, not  tissue or cotton balls. Cleanser: Wound Cleanser (Generic) Every Other Day/30 Days Discharge Instructions: Cleanse the wound with wound cleanser prior to applying a clean dressing using gauze sponges, not tissue or cotton balls. Prim Dressing: Endoform 2x2 in (Generic) Every Other Day/30 Days ary Discharge Instructions: Moisten with saline or Hydrogel Secondary Dressing: Optifoam Non-Adhesive Dressing, 4x4 in (Generic) Every Other Day/30 Days Discharge Instructions: Apply over primary dressing as directed. Secondary Dressing: Woven Gauze Sponge, Non-Sterile 4x4 in (Generic) Every Other Day/30 Days Discharge Instructions: Apply over primary dressing as directed. Secured With: 22M Medipore H Soft Cloth Surgical T ape, 4 x 10 (in/yd) (Generic) Every Other Day/30 Days Discharge Instructions: Secure with tape as directed. Com pression Wrap: Kerlix Roll 4.5x3.[name] (in/yd) Every Other Day/30 Days Discharge Instructions: Apply Kerlix and Coban compression as directed. 10/06/2022: There has been quite a bit of epithelialization since last week. There are still some open areas with slough accumulation. There is some callus buildup around the perimeter. I used a curette to debride slough and callus from the wound. We will continue to use his Keystone topical antibiotic compound with endoform. Continue peg assist offloading shoe. Follow-up in [name] week. Electronic Signature(s) Signed: 10/06/2022 10:[name]:26 AM By: [name] Maudlin MD FACS Entered By: [name] on 10/06/2022  10:[name]:25 -------------------------------------------------------------------------------- HxROS Details Patient Name: Date of Service: [name], [name] 10/06/2022 10:15 A [name] Medical Record Number: [name] Patient Account Number: [name] Date of Birth/Sex: Treating RN: 1976/07/10 (47 y.o. [name]) Primary Care Provider: Cristie [name] Other Clinician: Referring Provider: Treating Provider/Extender: [name]: [name] Information Obtained From Patient Constitutional Symptoms (General Health) Medical History: Past Medical History Notes: COVID PNA 07/22/2019-11/14/2019 VENT ECMO, foot drop left foot , Eyes Medical History: Negative for: Cataracts; Glaucoma; Optic Neuritis Ear/Nose/Mouth/Throat Medical History: Negative for: Chronic sinus problems/congestion; Middle ear problems Hematologic/Lymphatic Medical History: Negative for: Anemia; Hemophilia; Human Immunodeficiency Virus; Lymphedema; Sickle Cell Disease Respiratory Medical History: Positive for: Asthma Negative for: Aspiration; Chronic Obstructive Pulmonary Disease (COPD); Pneumothorax; Sleep Apnea; Tuberculosis Cardiovascular Medical History: Positive for: Angina - with COVID; Hypertension Negative for: Arrhythmia; Congestive Heart Failure; Coronary Artery Disease; Deep Vein Thrombosis; Hypotension; Myocardial Infarction; Peripheral Arterial Disease; Peripheral Venous Disease; Phlebitis; Vasculitis Gastrointestinal BRICK, SODERBERG ([name]) [phone].pdf Page 14 of 15 Medical History: Negative for: Cirrhosis ; Colitis; Crohns; Hepatitis A; Hepatitis B; Hepatitis C Endocrine Medical History: Negative for: Type I Diabetes; Type II Diabetes Genitourinary Medical History: Negative for: End Stage Renal Disease Past Medical History Notes: kidney stone Immunological Medical History: Negative for: Lupus Erythematosus; Raynauds; Scleroderma Integumentary  (Skin) Medical History: Negative for: History of Burn Musculoskeletal Medical History: Negative for: Gout; Rheumatoid Arthritis; Osteoarthritis; Osteomyelitis Neurologic Medical History: Negative for: Dementia; Neuropathy; Quadriplegia; Paraplegia; Seizure Disorder Oncologic Medical History: Negative for: Received Chemotherapy; Received Radiation Psychiatric Medical History: Negative for: Anorexia/bulimia; Confinement Anxiety Past Medical History Notes: anxiety Immunizations Pneumococcal Vaccine: Received Pneumococcal Vaccination: No Implantable Devices None Hospitalization / Surgery History Type of Hospitalization/Surgery COVID PNA 07/22/2019- 11/14/2019 03/27/2020 wound debridement/ skin graft Family and Social History Cancer: Yes - Maternal Grandparents; Diabetes: Yes - Father,Paternal Grandparents; Heart Disease: Yes - Maternal Grandparents; Hereditary Spherocytosis: No; Hypertension: Yes - Father,Paternal Grandparents; Kidney Disease: No; Lung Disease: Yes - Siblings; Seizures: No; Stroke: No; Thyroid Problems: No; Tuberculosis: No; Never smoker; Marital Status - Married; Alcohol Use: Never; Drug Use: No History; Caffeine Use: Daily - tea, soda; Financial Concerns: No; Food, Clothing or Shelter Needs: No; Support System Lacking: No; Transportation Concerns: No  Electronic Signature(s) Signed: 10/06/2022 10:50:19 AM By: [name] Maudlin MD FACS Entered By: [name] on 10/06/2022 10:48:24 -------------------------------------------------------------------------------- SuperBill Details Patient Name: Date of Service: [name], [name] 10/06/2022 [name] ([name]) [name].pdf Page 15 of 15 Medical Record Number: [name] Patient Account Number: [name] Date of Birth/Sex: Treating RN: 1976/01/13 (47 y.o. [name]) Primary Care Provider: Cristie [name] Other Clinician: Referring Provider: Treating Provider/Extender: [name]: [name] Diagnosis Coding ICD-10 Codes Code Description [phone] Non-pressure chronic ulcer of other part of left foot with fat layer exposed Facility Procedures : CPT4 Code: [id number] Description: [id number] - DEBRIDE WOUND 1ST 20 SQ CM OR < ICD-10 Diagnosis Description L97.522 Non-pressure chronic ulcer of other part of left foot with fat layer exposed Modifier: Quantity: [name] Physician Procedures : CPT4 Code Description Modifier [id number] - WC PHYS LEVEL 4 - EST PT 25 ICD-10 Diagnosis Description L97.522 Non-pressure chronic ulcer of other part of left foot with fat layer exposed Quantity: [name] : [id number] - WC PHYS DEBR WO ANESTH 20 SQ CM ICD-10 Diagnosis Description L97.522 Non-pressure chronic ulcer of other part of left foot with fat layer exposed Quantity: [name] Electronic Signature(s) Signed: 10/06/2022 10:[name]:40 AM By: [name] Maudlin MD FACS Entered By: [name] on 10/06/2022 10:[name]:40

## 2022-10-09 NOTE — Progress Notes (Signed)
JETLI, LAUBSCHER ([id number]) [phone].pdf Page 1 of 6 Visit Report for 10/06/2022 Arrival Information Details Patient Name: Date of Service: [name], [name] 10/06/2022 10:15 A M Medical Record Number: [id number] Patient Account Number: [id number] Date of Birth/Sex: Treating RN: [birth year] (47 y.o. M) Primary Care Brinleigh Tew: [name] Other Clinician: Referring Nekita Pita: Treating Christan Defranco/Extender: [name] in Treatment: 33 Visit Information History Since Last Visit All ordered tests and consults were completed: No Patient Arrived: Wheel Chair Added or deleted any medications: No Arrival Time: 09:52 Any new allergies or adverse reactions: No Accompanied By: spouse Had a fall or experienced change in No Transfer Assistance: None activities of daily living that may affect Patient Identification Verified: Yes risk of falls: Secondary Verification Process Completed: Yes Signs or symptoms of abuse/neglect since last visito No Patient Requires Transmission-Based Precautions: No Hospitalized since last visit: No Patient Has Alerts: No Implantable device outside of the clinic excluding No cellular tissue based products placed in the center since last visit: Pain Present Now: No Electronic Signature(s) Signed: 10/06/2022 10:06:43 AM By: Worthy Rancher Entered By: Worthy Rancher on 10/06/2022 09:52:38 -------------------------------------------------------------------------------- Encounter Discharge Information Details Patient Name: Date of Service: [name], [name] 10/06/2022 10:15 A M Medical Record Number: [id number] Patient Account Number: [id number] Date of Birth/Sex: Treating RN: 1975-08-12 (47 y.o. [name] Primary Care Suan Pyeatt: [name] Other Clinician: Referring Minah Axelrod: Treating Venessa Wickham/Extender: [name] in Treatment: [phone] Encounter Discharge Information Items Post Procedure  Vitals Discharge Condition: Stable Temperature (F): 98.1 Ambulatory Status: Wheelchair Pulse (bpm): 90 Discharge Destination: Home Respiratory Rate (breaths/min): 20 Transportation: Private Auto Blood Pressure (mmHg): 108/55 Accompanied By: spouse Schedule Follow-up Appointment: Yes Clinical Summary of Care: Patient Declined Electronic Signature(s) Signed: 10/06/2022 4:53:10 PM By: [name] Entered By: [name] on 10/06/2022 16:50:09 -------------------------------------------------------------------------------- Lower Extremity Assessment Details Patient Name: Date of Service: [name], [name] 10/06/2022 10:15 A M Medical Record Number: [id number] Patient Account Number: [id number] Date of Birth/Sex: Treating RN: 12-17-1975 (47 y.o. [name] Primary Care Eann Cleland: [name] Other Clinician: Referring Karynn Deblasi: Treating Passion Lavin/Extender: [name] in Treatment: 116 Edema Assessment Assessed: [name]: No] [name]: No] G[LeftJEXIEL, MALLEN [id number] [[id number].pdf Page 2 of 6] Edema: [Left: N] [Right: o] Calf Left: Right: Point of Measurement: 29 cm From Medial Instep 44.1 cm Ankle Left: Right: Point of Measurement: 9 cm From Medial Instep 24.7 cm Vascular Assessment Pulses: Dorsalis Pedis Palpable: [Left:Yes] Electronic Signature(s) Signed: 10/06/2022 4:53:10 PM By: [name] Entered By: [name] on 10/06/2022 10:09:04 -------------------------------------------------------------------------------- Multi Wound Chart Details Patient Name: Date of Service: [name], [name] 10/06/2022 10:15 A M Medical Record Number: [id number] Patient Account Number: [id number] Date of Birth/Sex: Treating RN: 09-10-1975 (47 y.o. M) Primary Care Arlander Gillen: [name] Other Clinician: Referring Camera Krienke: Treating Kahli Mayon/Extender: [name] in Treatment:  116 Vital Signs Height(in): 69 Pulse(bpm): 90 Weight(lbs): 280 Blood Pressure(mmHg): 108/55 Body Mass Index(BMI): 41.3 Temperature(F): 98.1 Respiratory Rate(breaths/min): 20 [2:Photos:] [N/A:N/A] Left Calcaneus N/A N/A Wound Location: Pressure Injury N/A N/A Wounding Event: Pressure Ulcer N/A N/A Primary Etiology: Asthma, Angina, Hypertension N/A N/A Comorbid History: 10/07/2019 N/A N/A Date Acquired: 116 N/A N/A Weeks of Treatment: Open N/A N/A Wound Status: No N/A N/A Wound Recurrence: 0.3x0.2x0.1 N/A N/A Measurements L x W x D (cm) 0.047 N/A N/A A (cm) : rea 0.005 N/A N/A Volume (cm) : 99.80% N/A N/A % Reduction in A  rea: 100.00% N/A N/A % Reduction in Volume: Category/Stage III N/A N/A Classification: Medium N/A N/A Exudate A mount: Serosanguineous N/A N/A Exudate Type: red, brown N/A N/A Exudate Color: Thickened N/A N/A Wound Margin: Large (67-100%) N/A N/A Granulation A mount: Red, Pink N/A N/A Granulation Quality: Small (1-33%) N/A N/A Necrotic A mount: Fat Layer (Subcutaneous Tissue): Yes N/A N/A Exposed Structures: Fascia: No Tendon: No Muscle: No PEREZ, [name] ([id number]) [phone].pdf Page 3 of 6 Joint: No Bone: No Medium (34-66%) N/A N/A Epithelialization: Debridement - Selective/Open Wound N/A N/A Debridement: Pre-procedure Verification/Time Out 10:08 N/A N/A Taken: Lidocaine 4% Topical Solution N/A N/A Pain Control: Necrotic/Eschar, Callus, Slough N/A N/A Tissue Debrided: Non-Viable Tissue N/A N/A Level: 0.06 N/A N/A Debridement A (sq cm): rea Curette N/A N/A Instrument: Minimum N/A N/A Bleeding: Pressure N/A N/A Hemostasis A chieved: 0 N/A N/A Procedural Pain: 0 N/A N/A Post Procedural Pain: Procedure was tolerated well N/A N/A Debridement Treatment Response: 0.3x0.2x0.1 N/A N/A Post Debridement Measurements L x W x D (cm) 0.005 N/A N/A Post Debridement Volume: (cm) Category/Stage  III N/A N/A Post Debridement Stage: Callus: Yes N/A N/A Periwound Skin Texture: Scarring: Yes Maceration: Yes N/A N/A Periwound Skin Moisture: No Abnormalities Noted N/A N/A Periwound Skin Color: No Abnormality N/A N/A Temperature: Debridement N/A N/A Procedures Performed: Treatment Notes Electronic Signature(s) Signed: 10/06/2022 10:47:35 AM By: Fredirick Maudlin MD FACS Entered By: Fredirick Maudlin on 10/06/2022 10:47:35 -------------------------------------------------------------------------------- Multi-Disciplinary Care Plan Details Patient Name: Date of Service: [name], [name] [place] [name] 10/06/2022 10:15 A M Medical Record Number: [id number] Patient Account Number: [id number] Date of Birth/Sex: Treating RN: Mar 03, 1976 (47 y.o. [name] Primary Care Rollin Kotowski: [name] Other Clinician: Referring Manjot Beumer: Treating Ayliana Casciano/Extender: [name] in Treatment: [place] reviewed with physician Active Inactive Wound/Skin Impairment Nursing Diagnoses: Impaired tissue integrity Knowledge deficit related [name] ulceration/compromised skin integrity Goals: Patient/caregiver will verbalize understanding of skin care regimen Date Initiated: 07/15/2020 Target Resolution Date: 03/08/2023 Goal Status: Active Ulcer/skin breakdown will have a volume reduction of 30% by week 4 Date Initiated: 07/15/2020 Date Inactivated: 08/20/2020 Target Resolution Date: 09/03/2020 Goal Status: Unmet Unmet Reason: no major changes. Ulcer/skin breakdown will heal within 14 weeks Date Initiated: 12/04/2020 Date Inactivated: 12/10/2020 Target Resolution Date: 12/10/2020 Unmet Reason: wounds still open at 14 Goal Status: Unmet weeks and today 21 weeks. Interventions: Assess patient/caregiver ability [name] obtain necessary supplies Assess patient/caregiver ability [name] perform ulcer/skin care regimen upon admission and as needed Assess ulceration(s) every  visit Provide education on ulcer and skin care Treatment Activities: Skin care regimen initiated : 07/15/2020 Topical wound management initiated : 07/15/2020 MR., RENNO ([id number]) [phone].pdf Page 4 of 6 Notes: Electronic Signature(s) Signed: 10/06/2022 4:53:10 PM By: [name] Entered By: [name] on 10/06/2022 10:17:29 -------------------------------------------------------------------------------- Pain Assessment Details Patient Name: Date of Service: [name], [place] 10/06/2022 10:15 A M Medical Record Number: [id number] Patient Account Number: [id number] Date of Birth/Sex: Treating RN: 11-27-1975 (47 y.o. M) Primary Care Reanna Scoggin: [name] Other Clinician: Referring Joslyn Ramos: Treating Pamella Samons/Extender: [name] in Treatment: [phone] Active Problems Location of Pain Severity and Description of Pain Patient Has Paino Yes Site Locations Rate the pain. Current Pain Level: 4 Worst Pain Level: 10 Least Pain Level: 0 Tolerable Pain Level: 3 Pain Management and Medication Current Pain Management: Electronic Signature(s) Signed: 10/06/2022 10:06:43 AM By: Worthy Rancher Entered By: Worthy Rancher on 10/06/2022 09:55:15 -------------------------------------------------------------------------------- Patient/Caregiver Education Details Patient Name: Date of Service:  [name], [name] [place] 2/29/2024andnbsp10:15 A M Medical Record Number: [id number] Patient Account Number: [id number] Date of Birth/Gender: Treating RN: 01/19/1976 (48 y.o. [name] Primary Care Physician: [name] Other Clinician: Referring Physician: Treating Physician/Extender: [name] in Treatment: 116 Education Assessment Education Provided [name]: Patient Education Topics Provided Wound/Skin Impairment: Methods: Explain/Verbal Responses: Return demonstration correctly ROURKE, KASE ([id number])  [phone].pdf Page 5 of 6 Electronic Signature(s) Signed: 10/06/2022 4:53:10 PM By: [name] Entered By: [name] on 10/06/2022 16:47:50 -------------------------------------------------------------------------------- Wound Assessment Details Patient Name: Date of Service: [name], [place] 10/06/2022 10:15 A M Medical Record Number: [id number] Patient Account Number: [id number] Date of Birth/Sex: Treating RN: [birth year] (47 y.o. M) Primary Care Cali Hope: [name] Other Clinician: Referring Tinzley Dalia: Treating Brandy Kabat/Extender: [name] in Treatment: 116 Wound Status Wound Number: 2 Primary Etiology: Pressure Ulcer Wound Location: Left Calcaneus Wound Status: Open Wounding Event: Pressure Injury Comorbid History: Asthma, Angina, Hypertension Date Acquired: 10/07/2019 Weeks Of Treatment: 116 Clustered Wound: No Photos Wound Measurements Length: (cm) 0.3 Width: (cm) 0.2 Depth: (cm) 0.1 Area: (cm) 0.047 Volume: (cm) 0.005 % Reduction in Area: 99.8% % Reduction in Volume: 100% Epithelialization: Medium (34-66%) Tunneling: No Undermining: No Wound Description Classification: Category/Stage III Wound Margin: Thickened Exudate Amount: Medium Exudate Type: Serosanguineous Exudate Color: red, brown Foul Odor After Cleansing: No Slough/Fibrino Yes Wound Bed Granulation Amount: Large (67-100%) Exposed Structure Granulation Quality: Red, Pink Fascia Exposed: No Necrotic Amount: Small (1-33%) Fat Layer (Subcutaneous Tissue) Exposed: Yes Necrotic Quality: Adherent Slough Tendon Exposed: No Muscle Exposed: No Joint Exposed: No Bone Exposed: No Periwound Skin Texture Texture Color No Abnormalities Noted: No No Abnormalities Noted: Yes Callus: Yes Temperature / Pain Scarring: Yes Temperature: No Abnormality Moisture No Abnormalities Noted: Yes Treatment Notes REYAAN, HORBAL ([id number])  [phone].pdf Page 6 of 6 Wound #2 (Calcaneus) Wound Laterality: Left Cleanser Normal Saline Discharge Instruction: Cleanse the wound with Normal Saline prior [name] applying a clean dressing using gauze sponges, not tissue or cotton balls. Wound Cleanser Discharge Instruction: Cleanse the wound with wound cleanser prior [name] applying a clean dressing using gauze sponges, not tissue or cotton balls. Peri-Wound Care Topical Primary Dressing Endoform 2x2 in Discharge Instruction: Moisten with saline or Hydrogel Secondary Dressing Optifoam Non-Adhesive Dressing, 4x4 in Discharge Instruction: Apply over primary dressing as directed. Woven Gauze Sponge, Non-Sterile 4x4 in Discharge Instruction: Apply over primary dressing as directed. Secured With 28M Medipore H Soft Cloth Surgical T ape, 4 x 10 (in/yd) Discharge Instruction: Secure with tape as directed. Compression Wrap Kerlix Roll 4.5x3.1 (in/yd) Discharge Instruction: Apply Kerlix and Coban compression as directed. Compression Stockings Add-Ons Electronic Signature(s) Signed: 10/06/2022 4:53:10 PM By: [name] Previous Signature: 10/06/2022 10:06:43 AM Version By: Worthy Rancher Entered By: [name] on 10/06/2022 10:11:52 -------------------------------------------------------------------------------- Vitals Details Patient Name: Date of Service: [name], [place] 10/06/2022 10:15 A M Medical Record Number: [id number] Patient Account Number: [id number] Date of Birth/Sex: Treating RN: June 03, 1976 (47 y.o. M) Primary Care Isbella Arline: [name] Other Clinician: Referring Classie Weng: Treating Elton Heid/Extender: [name] in Treatment: 116 Vital Signs Time Taken: 09:52 Temperature (F): 98.1 Height (in): 69 Pulse (bpm): 90 Weight (lbs): 280 Respiratory Rate (breaths/min): 20 Body Mass Index (BMI): 41.3 Blood Pressure (mmHg): 108/55 Reference Range: 80 - 120 mg /  dl Electronic Signature(s) Signed: 10/06/2022 10:06:43 AM By: Worthy Rancher Entered By: Worthy Rancher on 10/06/2022 09:54:29

## 2022-10-11 ENCOUNTER — Ambulatory Visit: Payer: PPO | Admitting: Allergy & Immunology

## 2022-10-12 ENCOUNTER — Other Ambulatory Visit: Payer: Self-pay | Admitting: Allergy & Immunology

## 2022-10-13 ENCOUNTER — Encounter (HOSPITAL_BASED_OUTPATIENT_CLINIC_OR_DEPARTMENT_OTHER): Payer: PPO | Attending: General Surgery | Admitting: General Surgery

## 2022-10-13 DIAGNOSIS — I10 Essential (primary) hypertension: Secondary | ICD-10-CM | POA: Insufficient documentation

## 2022-10-13 DIAGNOSIS — Z8616 Personal history of COVID-19: Secondary | ICD-10-CM | POA: Diagnosis not present

## 2022-10-13 DIAGNOSIS — L97522 Non-pressure chronic ulcer of other part of left foot with fat layer exposed: Secondary | ICD-10-CM | POA: Insufficient documentation

## 2022-10-13 DIAGNOSIS — J45909 Unspecified asthma, uncomplicated: Secondary | ICD-10-CM | POA: Diagnosis not present

## 2022-10-13 DIAGNOSIS — G6281 Critical illness polyneuropathy: Secondary | ICD-10-CM | POA: Insufficient documentation

## 2022-10-13 DIAGNOSIS — L89623 Pressure ulcer of left heel, stage 3: Secondary | ICD-10-CM | POA: Diagnosis not present

## 2022-10-15 NOTE — Progress Notes (Signed)
[name], [name] ([id number]) [id number] Page 1 of 15 Visit Report for 10/13/2022 Chief Complaint Document Details Patient Name: Date of Service: [name], [name] 10/13/2022 11:00 A M Medical Record Number: [id number] Patient Account Number: [id number] Date of Birth/Sex: Treating RN: [birth year] (47 y.o. M) Primary Care Provider: [name] Other Clinician: Referring Provider: Treating Provider/Extender: [name] in Treatment: [place] from: Patient Chief Complaint Bilateral Plantar Foot Ulcers Electronic Signature(s) Signed: 10/13/2022 12:36:57 PM By: Fredirick Maudlin MD FACS Entered By: Fredirick Maudlin on 10/13/2022 12:36:57 -------------------------------------------------------------------------------- Debridement Details Patient Name: Date of Service: [name], [name] 10/13/2022 11:00 A M Medical Record Number: [id number] Patient Account Number: [id number] Date of Birth/Sex: Treating RN: Apr 24, 1976 (47 y.o. [name] Primary Care Provider: [name] Other Clinician: Referring Provider: Treating Provider/Extender: [name] in Treatment: 117 Debridement Performed for Assessment: Wound #2 Left Calcaneus Performed By: Physician Fredirick Maudlin, MD Debridement Type: Debridement Level of Consciousness (Pre-procedure): Awake and Alert Pre-procedure Verification/Time Out Yes - 12:00 Taken: Start Time: 12:01 Pain Control: Lidocaine 4% Topical Solution T Area Debrided (L x W): otal 1 (cm) x 1 (cm) = 1 (cm) Tissue and other material debrided: Non-Viable, Callus, Slough, Skin: Dermis , Skin: Epidermis, Slough Level: Skin/Epidermis Debridement Description: Selective/Open Wound Instrument: Curette Bleeding: Moderate Hemostasis Achieved: Pressure End Time: 12:07 Procedural Pain: 0 Post Procedural Pain: 0 Response to Treatment: Procedure was tolerated well Level of Consciousness  (Post- Awake and Alert procedure): Post Debridement Measurements of Total Wound Length: (cm) 1 Stage: Category/Stage III Width: (cm) 1 Depth: (cm) 0.1 Volume: (cm) 0.079 Character of Wound/Ulcer Post Debridement: Improved Post Procedure Diagnosis Same as Pre-procedure Electronic Signature(s) Signed: 10/13/2022 1:02:33 PM By: Fredirick Maudlin MD FACS Signed: 10/13/2022 4:49:59 PM By: [name] RN, BSN Entered By: [name] on 10/13/2022 12:08:33 [name] ([id number]) [id number] Page 2 of 15 -------------------------------------------------------------------------------- HPI Details Patient Name: Date of Service: [name], [name] 10/13/2022 11:00 A M Medical Record Number: [id number] Patient Account Number: [id number] Date of Birth/Sex: Treating RN: 03/22/1976 (47 y.o. M) Primary Care Provider: [name] Other Clinician: Referring Provider: Treating Provider/Extender: [name] in Treatment: 117 History of Present Illness HPI Description: Wounds are12/03/2020 upon evaluation today patient presents for initial inspection here in our clinic concerning issues he has been having with the bottoms of his feet bilaterally. He states these actually occurred as wounds when he was hospitalized for 5 months secondary to Covid. He was apparently with tilting bed where he was in an upright position quite frequently and apparently this occurred in some way shape or form during that time. Fortunately there is no sign of active infection at this time. No fevers, chills, nausea, vomiting, or diarrhea. With that being said he still has substantial wounds on the plantar aspects of his feet Theragen require quite a bit of work to get these to heal. He has been using Santyl currently though that is been problematic both in receiving the medication as well as actually paid for it as it is become quite expensive. Prior to the experience with  Covid the patient really did not have any major medical problems other than hypertension he does have some mild generalized weakness following the Covid experience. 07/22/2020 on evaluation today patient appears to be doing okay in regard to his foot ulcers I feel like the wound beds are showing signs of better improvement that I do believe the Iodoflex is helping  in this regard. With that being said he does have a lot of drainage currently and this is somewhat blue/green in nature which is consistent with Pseudomonas. I do think a culture today would be appropriate for [place] to evaluate and see if that is indeed the case I would likely start him on antibiotic orally as well he is not allergic to Cipro knows of no issues he has had in the past 12/21; patient was admitted to the clinic earlier this month with bilateral presumed pressure ulcers on the bottom of his feet apparently related to excessive pressure from a tilt table arrangement in the intensive care unit. Patient relates this to being on ECMO but I am not really sure that is exactly related to that. I must say I have never seen anything like this. He has fairly extensive full-thickness wounds extending from his heel towards his midfoot mostly centered laterally. There is already been some healing distally. He does not appear to have an arterial issue. He has been using gentamicin to the wound surfaces with Iodoflex to help with ongoing debridement 1/6; this is a patient with pressure ulcers on the bottom of his feet related to excessive pressure from a standing position in the intensive care unit. He is complaining of a lot of pain in the right heel. He is not a diabetic. 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 We have been using Iodoflex to help prepare the surfaces of both wounds for an advanced treatment product. He is nonambulatory spending most of his time in a wheelchair I have asked him not to propel the  wheelchair with his heels 1/13; in general his wounds look better not much surface area change we have been using Iodoflex as of last week. I did an x-ray of the right heel as the patient was complaining of pain. I had some thoughts about a stress fracture perhaps Achilles tendon problems however what it showed was erosive changes along the inferior aspect of the calcaneus he now has a MRI booked for 1/20. 1/20; in general his wounds continue to be better. Some improvement in the large narrow areas proximally in his foot. He is still complaining of pain in the right heel and tenderness in certain areas of this wound. His MRI is tonight. I am not just looking for osteomyelitis that was brought up on the x-ray I am wondering about stress fractures, tendon ruptures etc. He has no such findings on the left. Also noteworthy is that the patient had critical illness neuropathy and some of the discomfort may be actual improvement in nerve function I am just not sure. These wounds were initially in the setting of severe critical illness related to COVID-19. He was put in a standing position. He may have also been on pressors at the point contributing to tissue ischemia. By his description at some point these wounds were grossly necrotic extending proximally up into the Achilles part of his heel. I do not know that I have ever really seen pictures of them like this although they may exist in epic We have ordered Tri layer Oasis. I am trying to stimulate some granulation in these areas. This is of course assuming the MRI is negative for infection 1/27; since the patient was last here he saw Dr. Juleen China of infectious disease. He is planned for vancomycin and ceftriaxone. Prior operative culture grew MSSA. Also ordered baseline lab work. He also ordered arterial studies although the ABIs in our clinic were  normal as well as his clinical exam these were normal I do not think he needs to see vascular surgery. His  ABIs at the PTA were 1.22 in the right triphasic waveforms with a normal TBI of 1.15 on the left ABI of 1.22 with triphasic waveforms and a normal TBI of 1.08. Finally he saw Dr. Amalia Hailey who will follow him in for 2 months. At this point I do not think he felt that he needed a procedure on the right calcaneal bone. Dr. Juleen China is elected for broad-spectrum antibiotic The patient is still having pain in the right heel. He walks with a walker 2/3; wounds are generally smaller. He is tolerating his IV antibiotics. I believe this is vancomycin and ceftriaxone. We are still waiting for Oasis burn in terms of his out-of-pocket max which he should be meeting soon given the IV antibiotics, MRIs etc. I have asked him to check in on this. We are using silver collagen in the meantime the wounds look better 2/10; tolerating IV vancomycin and Rocephin. We are waiting to apply for Oasis. Although I am not really sure where he is in his out-of-pocket max. 2/17 started the first application of Oasis trilayer. Still on antibiotics. The wounds have generally look better. The area on the left has a little more surface slough requiring debridement [id number]; second application of Oasis trilayer. The wound surface granulation is generally look better. The area on the left with undermining laterally I think is come in a bit. 10/08/2020 upon evaluation today patient is here today for [employer] application #3. Fortunately he seems to be doing extremely well with regard to this and we are seeing a lot of new epithelial growth which is great news. Fortunately there is no signs of active infection at this time. 10/16/2020 upon evaluation today patient appears to be doing well with regard to his foot ulcers. Do believe the Oasis has been of benefit for him. I do not see any signs of infection right now which is great news and I think that he has a lot of new epithelial growth which is great to see as well. The patient is  very pleased to hear all of this. I do think we can proceed with the Oasis trilayer #4 today. 3/18; not as much improvement in these areas on his heels that I was hoping. I did reapply trilateral Oasis today the tissue looks healthier but not as much fill in as I was hoping. 3/25; better looking today I think this is come in a bit the tissue looks healthier. Triple layer Oasis reapplied #6 4/1; somewhat better looking definitely better looking surface not as much change in surface area as I was hoping. He may be spending more time [place] on days then he needs to although he does have heel offloading boots. Triple layer Oasis reapplied #7 4/7; unfortunately apparently [facility] will not approve any further Oasis which is unfortunate since the patient did respond nicely both in terms of the condition of the wound bed as well as surface area. There is still some drainage coming from the wound but not a lot there does not appear to be any infection 4/15; we have been using Hydrofera Blue. He continues to have nice rims of epithelialization on the right greater than the left. The left the epithelialization is coming from the tip of his heel. There is moderate drainage. In this that concerns me about a total contact cast. There is no evidence of  infection 4/29; patient has been using Hydrofera Blue with dressing changes. He has no complaints or issues today. [name], [name] ([id number]) [id number] Page 3 of 15 5/5; using Hydrofera Blue. I actually think that he looks marginally better than the last time I saw this 3 weeks ago. There are rims of epithelialization on the left thumb coming from the medial side on the right. Using Hydrofera Blue 5/12; using Hydrofera Blue. These continue to make improvements in surface area. His drainage was not listed as severe I therefore went ahead and put a cast on the left foot. Right foot we will continue to dress his  previous 5/16; back for first total contact cast change. He did not tolerate this particularly well cast injury on the anterior tibia among other issues. Difficulty sleeping. I talked him about this in some detail and afterwards is elected to continue. I told him I would like to have a cast on for 3 weeks to see if this is going to help at all. I think he agreed 5/19; I think the wound is better. There is no tunneling towards his midfoot. The undermining medially also looks better. He has a rim of new skin distally. I think we are making progress here. The area on the left also continues to look somewhat better to me using Hydrofera Blue. He has a list of complaints about the cast but none of them seem serious 5/26; patient presents for 1 week follow-up. He has been using a total contact cast and tolerating this well. Hydrofera Blue is the main dressing used. He denies signs of infection. 6/2 Hydrofera Blue total contact cast on the left. These were large ulcers that formed in intensive care unit where the patient was recovering from [place]. May have had something to do with being ventilated in an upright positiono Pressors etc. We have been able to get the areas down considerably and a viable surface. There is some epithelialization in both sides. Note made of drainage 6/9; changed to [facility] last time because of drainage. He arrives with better looking surfaces and dimensions on the left than the right. Paradoxically the right actually probes more towards his midfoot the left is largely close down but both of these look improved. Using a total contact cast on the left 6/16; complex wounds on his bilateral plantar heels which were initially pressure injury from a stay in the ICU with COVID. We have been using silver alginate most recently. His dimensions of come in quite dramatically however not recently. We have been putting the left foot in a total contact cast 6/23; complex wounds on the  bilateral plantar heels. I been putting the left in the cast paradoxically the area on the right is the one that is going towards closure at a faster rate. Quite a bit of drainage on the left. The patient went to see Dr. Amalia Hailey who said he was going to standby for skin grafts. I had actually considered sending him for skin grafts however he would be mandatorily off his feet for a period of weeks to months. I am thinking that the area on the right is going to close on its own the area on the left has been more stubborn even though we have him in a total contact cast 6/30; took him out of a total contact cast last week is the right heel seem to be making better progress than the left where I was placing the cast. We are using silver alginate. Both  wounds are smaller right greater than left 7/12; both wounds look as though they are making some progress. We are using silver alginate. Heel offloading boots 7/26; very gradual progress especially on the right. Using silver alginate. He is wearing heel offloading boots 8/18; he continues to close these wounds down very gradually. Using silver alginate. The problem polymen being definitive about this is areas of what appears to be callus around the margins. This is not a 100% of the area but certainly sizable especially on the right 9/1; bilateral plantar feet wounds secondary to prolonged pressure while being ventilated for COVID-19 in an upright position. Essentially pressure ulcers on the bottom of his feet. He is made substantial progress using silver alginate. 9/14; bilateral plantar feet wounds secondary to prolonged pressure. Making progress using silver alginate. 9/29 bilateral plantar feet wounds secondary to prolonged pressure. I changed him to Iodoflex last week. MolecuLight showing reddened blush fluorescence 10/11; patient presents for follow-up. He has no issues or complaints today. He denies signs of infection. He continues to use Iodoflex and  antibiotic ointment to the wound beds. 10/27; 2-week follow-up. No evidence of infection. He has callus and thick dry skin around the wound margins we have been using Iodoflex and Bactroban which was in response to a moderate left MolecuLight reddish blush fluorescence. 11/10; 2-week follow-up. Wound margins again have thick callus however the measurements of the actual wound sites are a lot smaller. Everything looks reasonably healthy here. We have been using Iodoflex He was approved for prime matrix but I have elected to delay this given the improvement in the surface area. Hopefully I will not regret that decision as were getting close to the end of the year in terms of insurance payment 12/8; 2-week follow-up. Wounds are generally smaller in size. These were initially substantial wounds extending into the forefoot all the way into the heel on the bilateral plantar feet. They are now both located on the plantar heel distal aspect both of these have a lot of callus around the wounds I used a #5 curette to remove this on the right and the left also some subcutaneous debris to try and get the wound edges were using Iodoflex. He has heel offloading shoe 12/22; 2-week follow-up. Not really much improvement. He has thick callus around the outer edges of both wounds. I remove this there is some nonviable subcutaneous tissue as well. We have been using Iodoflex. Her intake nurse and myself spontaneously thought of a total contact cast I went back in May. At that time we really were not seeing much of an improvement with a cast although the wound was in a much different situation I would like to retry this in 2 weeks and I discussed this with the patient 08/12/2021; the patient has had some improvement with the Iodoflex. The the area on the left heel plantar more improved than the right. I had to put him in a total contact cast on the left although I decided to put that off for 2 weeks. I am going to change  his primary dressing to silver collagen. I think in both areas he has had some improvement most of the healing seems to be more proximal in the heel. The wounds are in the mid aspect. A lot of thick callus on the right heel however. 1/19; we are using silver collagen on both plantar heel areas. He has had some improvement today. The left did not require any debridement. He still had some eschar  on the right that was debrided but both seem to have contracted. I did not put it total contact cast on him today 2/2 we have been using silver collagen. The area on the right plantar heel has areas that appear to be epithelialized interspersed with dry flaking callus and dry skin. I removed this. This really looks better than on the other side. On the left still a large area with raised edges and debris on the surface. 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 He has no issues or complaints today. 2/9; not much change in the left foot wound with 1 week of a cast we are using silver collagen. Silver collagen on the right side. The right side has been the better wound surface. We will reapply the total contact cast on the left 2/16; not much improvement on either side I been using silver collagen with a total contact cast on the left. I'm changing the Hydrofera Blue still with a total contact cast on the left 2/23; some improvement on both sides. Disappointing that he has thick callus around the area that we are putting in a total contact cast on the left. We've been using Hydrofera Blue on both wound areas. This is a man who at essentially pressure ulcers in addition to ischemia caused by medications to support his blood pressure (pressors) in the ICU. He was being ventilated in the standing position for severe Covid. A Shiley the wounds extended across his entire foot but are  now localized to his plantar heels bilaterally. We have made progress however neither areas healed. I continue to think the total contact cast is helped albeit painstakingly slowly. He has never wanted a plastic surgery consult although I don't know that they would be interested in grafting in area in this location. 10/07/2021: Continued improvement bilaterally. There is still some callus around the left wound, despite the total contact cast. He has some increased pain in his right midfoot around 1 particular area. This has been painful in the past but seems to be a little bit worse. When his cast was removed today, there was an area on the heel of the left foot that looks a bit macerated. He is also complaining of pain in his left thigh and hip which he thinks is secondary to the limb length discrepancy caused by the cast. 10/14/2021: He continues to improve. A little bit less callus around the left wound. He continues to endorse pain in his right midfoot, but this is not as significant as it was last week. The maceration on his left heel is improved. 10/21/2021: Continued improvement to both wounds. The maceration on his left heel is no longer evident. Less callus bilaterally. Epithelialization progressing. 10/28/2021: Significant improvement this week. The right sided wound is nearly closed with just a small open area at the middle. No maceration seen on the left heel. Continued epithelialization on both sides. No concern for infection. [name], [name] ([id number]) [id number] Page 4 of 15 11/04/2021: T oday, the wounds were measured a little bit differently and come out as larger, but I actually think they are about the same to potentially even smaller, particularly on the left. He continues to accumulate some callus on the right. 11/11/2021: T oday, the patient is expressing some concern that the left wound, despite being in the total contact cast, is not progressing  at the same  rate as the 1 on the right. He is interested in trying a week without the cast to see how the wound does. The wounds are roughly the same size as last week, with the right perhaps being a little bit smaller. He continues to build up callus on both sites. 11/18/2021: Last week, I permitted the patient to go without his total contact cast, just to see if the cast was really making any difference. Today, both wounds have deteriorated to some extent, suggesting that the cast is providing benefit, at least on the left. Both are larger and have accumulated callus, slough, and other debris. 11/26/2021: I debrided both wounds quite aggressively last week in an effort to stimulate the healing cascade. This appears to have been effective as the left sided wound is a full centimeter shorter in length. Although the right was measured slightly larger, on inspection, it looks as though an area of epithelialized tissue was included in the measurements. We have been using PolyMem Ag on the wound surfaces with a total contact cast to the left. 12/02/2021: It appears that the intake personnel are including epithelialized tissue in his wound measurements; the right wound is almost completely epithelialized; there is just a crater at the proximal midfoot with some open areas. On the left, he has built up some callus, but the overall wound surface looks good. There is some senescent skin around the wound margin. He has been in PolyMem Ag bilaterally with a total contact cast on the left. 12/09/2021: The right wound is nearly closed; there is just a small open area at the mid calcaneus. On the left, the wound is smaller with minimal callus buildup. No significant drainage. 12/16/2021: The right calcaneal wound remains minimally open at the mid calcaneus; the rest has epithelialized. On the left, the wound is also a little bit smaller. There is some senescent tissue on the periphery. He is getting his first application of a trial  skin substitute called Vendaje today. 12/23/2021: The wound on his right calcaneus is nearly closed; there is just a small area at the most distal aspect of the calcaneus that is open. On the left, the area where we applied to the skin substitute has a healthier-looking bed of granulation tissue. The wound dimensions are not significantly different on this side but the wound surface is improved. 12/30/2021: The wound on the right calcaneus has not changed significantly aside from some accumulation of callus. On the left, the open area is smaller and continues to have an improved surface. He continues to accumulate callus around the wound. He is here for his third application of Vendaje. 01/06/2022: The right calcaneal wound is down to just a couple of millimeters. He continues to accumulate periwound callus. He unfortunately got his cast wet earlier in the week and his left foot is macerated, resulting in some superficial skin loss just distal to the open wound. The open wound itself, however, is much smaller and has a healthier appearing surface. He is here for his fourth application of Vendaje. 01/13/2022: The right calcaneal wound is about the same. Unfortunately, once again, his cast got wet and his foot is again macerated. This is caused the left calcaneal wound to enlarge. He is here for his fifth application of Vendaje. 01/20/2022: The right calcaneal wound is very small. There is some periwound callus accumulation. He purchased a new cast protector last week and this has been effective in avoiding the maceration that has been occurring  on the left. The left calcaneal wound is narrower and has a healthy and viable-appearing surface. He is here for his 6 application of Vendaje. 01/27/2022: The right calcaneal wound is down to just a pinhole. There is some periwound slough and callus. On the left, the wound is narrower and shorter by about a centimeter. The surface is robust and viable-appearing.  Unfortunately, the rep for the trial skin substitute product did not provide any for [place] to use today. 02/04/2022: The right calcaneal wound remains unchanged. There is more accumulated callus. On the left, although the intake nurse measured it a little bit longer, it looks about the same to me. The surface has a layer of slough, but underneath this, there is good granulation tissue. 02/10/2022: The right calcaneus wound is nearly closed. There is still some callus that builds up around the site. The left side looks about the same in terms of dimensions, but the surface is more robust and vital-appearing. 02/16/2022: The area of the right calcaneus that was nearly closed last week has closed, but there is a small opening at the mid foot where it looks like some moisture got retained and caused some reopening. The left foot wound is narrower and shallower. Both sites have a fair amount of periwound callus and eschar. 02/24/2022: The small midfoot opening on the right calcaneus is a little bit smaller today. The left foot wound is narrower and shallower. He continues to accumulate periwound callus. No concern for infection. 03/01/2022: The patient came to clinic early because he showered and got his cast wet. Fortunately, there is no significant maceration to his foot but the callus softened and it looks like the wound on his left calcaneus may be a little bit wider. The wound on his right calcaneus is just a narrow slit. Continued accumulation of periwound callus bilaterally. 03/08/2022: The wound on his right calcaneus is very nearly closed, just a small pinpoint opening under a bit of eschar; the left wound has come in quite a bit, as well. It is narrower and shorter than at our last visit. Still with accumulated callus and eschar bilaterally. 03/17/2022: The right calcaneal wound is healed. The left wound is smaller and the surface itself is very clean, but there is some blue-green staining on the periwound  callus, concerning for Pseudomonas aeruginosa. 03/23/2022: The right calcaneal wound remains closed. The left wound continues to contract. No further blue-green staining. Small amount of callus and slough accumulation. 03/28/2022: He came in early today because he had gotten his cast wet. On inspection, the wound itself did not get wet or macerated, just a little bit of the forefoot. The wound itself is basically unchanged. 04/07/2022: The right foot wound remains closed. The left wound is the smallest that I have seen it to date. It is narrower and shorter. It still continues to accumulate slough on the surface. 04/15/2022: There is a band of epithelium now dividing the small left plantar foot wound in 2. There is still some slough on the surface. 04/21/2022: The wound continues to narrow. Just a little bit of slough on the surface. He seems to be responding well to endoform. 04/28/2022: Continued slow contraction of the wound. There is a little slough on the surface and some periwound callus. We have been using endoform and total contact cast. 05/05/2022: The wound appears to have stalled. There is slough and some periwound eschar/callus. No concern for infection, however. 05/12/2022: Unfortunately, his right foot has reopened. It is located at  the most posterior aspect of his surgical incision. The area was noted to have drainage coming from it when his padding was removed today. Underneath some callus and senescent skin, there is an opening. No purulent drainage or malodor. On the left foot, the wound is again unchanged. There is some light blue staining on the callus, but no malodor or purulent drainage. 10/13; right and left heel remanence of extensive plantar foot wounds. These are better than I remember by quite a big margin however he is still left with SAI, [name] ([id number]) [id number] Page 5 of 15 wounds on the left plantar heel and the right plantar heel. Been  using endoform bilaterally. A culture was done that showed apparently Pseudomonas but we are still waiting [facility] the [facility] to use gentamicin today. He is still very active by description I am not sure about the offloading of his noncasted right foot 10/20; both wounds right and left heel debrided not much change from last week. [employer] has arrived which is linezolid, gentamicin and ciprofloxacin we will use this with endoform. T contact cast on the left otal 06/02/2022: Both wounds are smaller today. There is still a fair amount of callus buildup around the right foot ulcer. The left is more superficial and nearly flush with the surrounding tissues. Also with slough and eschar buildup. 06/10/2022: The right sided wound appears to be nearly closed, if not completely so, although it is somewhat difficult to tell given the abnormal tissue and scarring in his foot. There is a fair amount of callus and crust accumulation. On the left, the wound looks about the same, again with callus and slough. He has an appointment next Thursday with Dr. Loel Lofty in podiatry; I am hopeful that there may be some reconstructive options available for Mr. Branton. 06/16/2022: Both wounds have some eschar and callus accumulation. The right sided wound is extremely narrow and barely open; the left is narrower than last week. There is a little bit of slough. He has his appointment in podiatry later today. 06/23/2022: The patient met with Dr. Loel Lofty last week and unfortunately, there are no reconstructive options that he believes would be helpful. He did order an MRI to evaluate for osteomyelitis and fortunately, none was seen. The left sided wound is a little bit shorter and narrower today. The right sided wound is about the same. There is callus and eschar accumulation bilaterally. 06/29/2022: Both feet have improved from last week. There is epithelium making a valiant effort to creep across the surface on  the left. The right side looks like it got a little dry and the deep crevasse in his midfoot has cracked. Both have eschar and there is some slough on the left. 07/07/2022: Both feet have improved. There is epithelium completely covering the calcaneus on the right with just a small opening in the crevasse in his midfoot. On the left, the open area of tissue is smaller but he continues to build up callus/eschar and slough. 07/15/2022: The opening in the midfoot on the right is about the same size, covered with eschar and a little bit of slough. The open portion of the left wound is narrower and shorter with a bit of slough buildup. He admits to being on his feet more than recommended. 12/14; as far as I can tell everything on the right foot is closed. There is some eschar I removed some of this I cannot identify any open wound here. As usual this will be a very vulnerable  area going forward. On the left this looks really quite healthy. I was pleasantly surprised to see how good this looked. Wound is certainly smaller and there appears to be healthy epithelialization. He has been using Promogran on the right and endoform on the left. He has been offloading the right foot with a heel offloading boot and he has a running shoe on the right foot 08/04/2022: The right foot remains closed. He has a thick cushioned insole in his sneaker. The left sided wound is smaller with just some slough and eschar accumulation. He is wearing the heel off loader on this foot. 08/15/2022: The right foot remains closed. The left wound has narrowed further. There is some slough and eschar accumulation. 08/25/2022: We put him in a peg assist shoe insert and as a result, he has more epithelialization of the ulcer with minimal slough and eschar accumulation. 09/01/2022: The wound is smaller by about half this week. Still with some slough on the surface. The peg assist shoe seems to be doing a remarkable job of adequately offloading  the site. 09/08/2022: There is a little bit more epithelium coming in. There is some slough and callus buildup. 09/16/2022: The wound measures about the same size, but the epithelium that has grown in looks more robust and stronger. There is some slough and callus buildup. 09/23/2022: The wound remains about the same size. The skin edges are looking rather senescent. 09/29/2022: The aggressive debridement I performed last week seems to have been effective. The wound is smaller and has significantly less slough accumulation. 10/06/2022: There has been quite a bit of epithelialization since last week. There are still some open areas with slough accumulation. There is some callus buildup around the perimeter. 10/13/2022: Continued improvement. Minimal callus accumulation. Electronic Signature(s) Signed: 10/13/2022 12:37:25 PM By: Fredirick Maudlin MD FACS Entered By: Fredirick Maudlin on 10/13/2022 12:37:25 -------------------------------------------------------------------------------- Physical Exam Details Patient Name: Date of Service: [name], [place] 10/13/2022 11:00 A M Medical Record Number: [id number] Patient Account Number: [id number] Date of Birth/Sex: Treating RN: [birth year] (47 y.o. M) Primary Care Provider: [name] Other Clinician: Referring Provider: Treating Provider/Extender: [name] in Treatment: 117 Constitutional . . . . no acute distress. Respiratory Normal work of breathing on room air. [name], BISA[place]ON ([id number]) [id number] Page 6 of 15 10/13/2022: The wound continues to contract. Minimal slough accumulation and very little callus buildup. Electronic Signature(s) Signed: 10/13/2022 12:39:09 PM By: Fredirick Maudlin MD FACS Entered By: Fredirick Maudlin on 10/13/2022 12:39:09 -------------------------------------------------------------------------------- Physician Orders Details Patient Name: Date of Service: [name], [place]. 10/13/2022 11:00 A M Medical Record Number: [id number] Patient Account Number: [id number] Date of Birth/Sex: Treating RN: [birth year] (47 y.o. [name] Primary Care Provider: [name] Other Clinician: Referring Provider: Treating Provider/Extender: [name] in Treatment: 117 Verbal / Phone Orders: No Diagnosis Coding ICD-10 Coding Code Description [id number] Non-pressure chronic ulcer of other part of left foot with fat layer exposed Follow-up Appointments ppointment in 1 week. - Dr. [name] (Dr. Heber Hazard covering) 1030 10/20/2022 Return A ppointment in 2 weeks. - Dr. [name] 10/27/2022 1015 Room 3 Return A Anesthetic Wound #2 Left Calcaneus (In clinic) Topical Lidocaine 4% applied to wound bed - In clinic Bathing/ Shower/ Hygiene May shower and wash wound with soap and water. Edema Control - Lymphedema / SCD / Other Bilateral Lower Extremities Avoid standing for long periods of time. Moisturize legs daily. - as needed Off-Loading Other: - keep  pressure off of the bottom of your feet. Elevate legs throughout the day. Use the Shoe with the PegAssist off-loading insole Additional Orders / Instructions Follow Nutritious Diet - Try to get 70-100 grams of Protein a day+ Wound Treatment Wound #2 - Calcaneus Wound Laterality: Left Cleanser: Normal Saline (Generic) Every Other Day/30 Days Discharge Instructions: Cleanse the wound with Normal Saline prior to applying a clean dressing using gauze sponges, not tissue or cotton balls. Cleanser: Wound Cleanser (Generic) Every Other Day/30 Days Discharge Instructions: Cleanse the wound with wound cleanser prior to applying a clean dressing using gauze sponges, not tissue or cotton balls. Topical: compounding topical antibiotics from [place] Every Other Day/30 Days Discharge Instructions: apply directly to wound bed. Prim Dressing: Endoform 2x2 in (Generic) Every Other Day/30  Days ary Discharge Instructions: Moisten with saline or Hydrogel Secondary Dressing: Optifoam Non-Adhesive Dressing, 4x4 in (Generic) Every Other Day/30 Days Discharge Instructions: Apply over primary dressing as directed. Secondary Dressing: Woven Gauze Sponge, Non-Sterile 4x4 in (Generic) Every Other Day/30 Days Discharge Instructions: Apply over primary dressing as directed. Secured With: 23M Medipore H Soft Cloth Surgical T ape, 4 x 10 (in/yd) (Generic) Every Other Day/30 Days Discharge Instructions: Secure with tape as directed. Compression Wrap: Kerlix Roll 4.5x3.1 (in/yd) Every Other Day/30 Days Discharge Instructions: Apply Kerlix and Coban compression as directed. [name], [name] ([id number]) [id number] Page 7 of 15 Electronic Signature(s) Signed: 10/13/2022 1:02:33 PM By: Fredirick Maudlin MD FACS Entered By: Fredirick Maudlin on 10/13/2022 12:39:18 -------------------------------------------------------------------------------- Problem List Details Patient Name: Date of Service: [name], [place] [name] 10/13/2022 11:00 A M Medical Record Number: [id number] Patient Account Number: [id number] Date of Birth/Sex: Treating RN: 02-25-1976 (47 y.o. [name] Primary Care Provider: [name] Other Clinician: Referring Provider: Treating Provider/Extender: [name] in Treatment: 117 Active Problems ICD-10 Encounter Code Description Active Date MDM Diagnosis L97.522 Non-pressure chronic ulcer of other part of left foot with fat layer exposed 09/03/2020 No Yes Inactive Problems ICD-10 Code Description Active Date Inactive Date L97.512 Non-pressure chronic ulcer of other part of right foot with fat layer exposed 09/03/2020 09/03/2020 L89.893 Pressure ulcer of other site, stage 3 07/15/2020 07/15/2020 M62.81 Muscle weakness (generalized) 07/15/2020 07/15/2020 I10 Essential (primary) hypertension 07/15/2020 07/15/2020 M86.171 Other acute  osteomyelitis, right ankle and foot 09/03/2020 09/03/2020 Resolved Problems Electronic Signature(s) Signed: 10/13/2022 12:36:35 PM By: Fredirick Maudlin MD FACS Entered By: Fredirick Maudlin on 10/13/2022 12:36:35 -------------------------------------------------------------------------------- Progress Note Details Patient Name: Date of Service: [name], [place]. 10/13/2022 11:00 A M Medical Record Number: [id number] Patient Account Number: [id number] Date of Birth/Sex: Treating RN: [birth year] (47 y.o. M) Primary Care Provider: [name] Other Clinician: Referring Provider: Treating Provider/Extender: [name] in Treatment: 117 Subjective Chief Complaint Information obtained from Patient Bilateral Plantar Foot Ulcers History of Present Illness (HPI) ANJAY, KOKAL ([id number]) [id number] Page 8 of 15 Wounds are12/03/2020 upon evaluation today patient presents for initial inspection here in our clinic concerning issues he has been having with the bottoms of his feet bilaterally. He states these actually occurred as wounds when he was hospitalized for 5 months secondary to Covid. He was apparently with tilting bed where he was in an upright position quite frequently and apparently this occurred in some way shape or form during that time. Fortunately there is no sign of active infection at this time. No fevers, chills, nausea, vomiting, or diarrhea. With that being said he still has substantial wounds on the plantar aspects  of his feet Theragen require quite a bit of work to get these to heal. He has been using Santyl currently though that is been problematic both in receiving the medication as well as actually paid for it as it is become quite expensive. Prior to the experience with Covid the patient really did not have any major medical problems other than hypertension he does have some mild generalized weakness following the Covid  experience. 07/22/2020 on evaluation today patient appears to be doing okay in regard to his foot ulcers I feel like the wound beds are showing signs of better improvement that I do believe the Iodoflex is helping in this regard. With that being said he does have a lot of drainage currently and this is somewhat blue/green in nature which is consistent with Pseudomonas. I do think a culture today would be appropriate for [place] to evaluate and see if that is indeed the case I would likely start him on antibiotic orally as well he is not allergic to Cipro knows of no issues he has had in the past 12/21; patient was admitted to the clinic earlier this month with bilateral presumed pressure ulcers on the bottom of his feet apparently related to excessive pressure from a tilt table arrangement in the intensive care unit. Patient relates this to being on ECMO but I am not really sure that is exactly related to that. I must say I have never seen anything like this. He has fairly extensive full-thickness wounds extending from his heel towards his midfoot mostly centered laterally. There is already been some healing distally. He does not appear to have an arterial issue. He has been using gentamicin to the wound surfaces with Iodoflex to help with ongoing debridement 1/6; this is a patient with pressure ulcers on the bottom of his feet related to excessive pressure from a standing position in the intensive care unit. He is complaining of a lot of pain in the right heel. He is not a diabetic. 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 We have been using Iodoflex to help prepare the surfaces of both wounds for an advanced treatment product. He is nonambulatory spending most of his time in a wheelchair I have asked him not to propel the wheelchair with his heels 1/13; in general his wounds look better not much surface area change we have been using Iodoflex as of last week. I did an x-ray of  the right heel as the patient was complaining of pain. I had some thoughts about a stress fracture perhaps Achilles tendon problems however what it showed was erosive changes along the inferior aspect of the calcaneus he now has a MRI booked for 1/20. 1/20; in general his wounds continue to be better. Some improvement in the large narrow areas proximally in his foot. He is still complaining of pain in the right heel and tenderness in certain areas of this wound. His MRI is tonight. I am not just looking for osteomyelitis that was brought up on the x-ray I am wondering about stress fractures, tendon ruptures etc. He has no such findings on the left. Also noteworthy is that the patient had critical illness neuropathy and some of the discomfort may be actual improvement in nerve function I am just not sure. These wounds were initially in the setting of severe critical illness related to COVID-19. He was put in a standing position. He may have also been on pressors at the point contributing to tissue ischemia. By  his description at some point these wounds were grossly necrotic extending proximally up into the Achilles part of his heel. I do not know that I have ever really seen pictures of them like this although they may exist in epic We have ordered Tri layer Oasis. I am trying to stimulate some granulation in these areas. This is of course assuming the MRI is negative for infection 1/27; since the patient was last here he saw Dr. Juleen China of infectious disease. He is planned for vancomycin and ceftriaxone. Prior operative culture grew MSSA. Also ordered baseline lab work. He also ordered arterial studies although the ABIs in our clinic were normal as well as his clinical exam these were normal I do not think he needs to see vascular surgery. His ABIs at the [facility] were 1.22 in the right triphasic waveforms with a normal TBI of 1.15 on the left ABI of 1.22 with triphasic waveforms and a normal TBI of  1.08. Finally he saw Dr. Amalia Hailey who will follow him in for 2 months. At this point I do not think he felt that he needed a procedure on the right calcaneal bone. Dr. Juleen China is elected for broad-spectrum antibiotic The patient is still having pain in the right heel. He walks with a walker 2/3; wounds are generally smaller. He is tolerating his IV antibiotics. I believe this is vancomycin and ceftriaxone. We are still waiting for Oasis burn in terms of his out-of-pocket max which he should be meeting soon given the IV antibiotics, MRIs etc. I have asked him to check in on this. We are using silver collagen in the meantime the wounds look better 2/10; tolerating IV vancomycin and Rocephin. We are waiting to apply for Oasis. Although I am not really sure where he is in his out-of-pocket max. 2/17 started the first application of Oasis trilayer. Still on antibiotics. The wounds have generally look better. The area on the left has a little more surface slough requiring debridement [id number]; second application of Oasis trilayer. The wound surface granulation is generally look better. The area on the left with undermining laterally I think is come in a bit. 10/08/2020 upon evaluation today patient is here today for [employer] application #3. Fortunately he seems to be doing extremely well with regard to this and we are seeing a lot of new epithelial growth which is great news. Fortunately there is no signs of active infection at this time. 10/16/2020 upon evaluation today patient appears to be doing well with regard to his foot ulcers. Do believe the Oasis has been of benefit for him. I do not see any signs of infection right now which is great news and I think that he has a lot of new epithelial growth which is great to see as well. The patient is very pleased to hear all of this. I do think we can proceed with the Oasis trilayer #4 today. 3/18; not as much improvement in these areas on his heels that I was  hoping. I did reapply trilateral Oasis today the tissue looks healthier but not as much fill in as I was hoping. 3/25; better looking today I think this is come in a bit the tissue looks healthier. Triple layer Oasis reapplied #6 4/1; somewhat better looking definitely better looking surface not as much change in surface area as I was hoping. He may be spending more time [place] on days then he needs to although he does have heel offloading boots. Triple layer Oasis reapplied #7  4/7; unfortunately apparently [facility] will not approve any further Oasis which is unfortunate since the patient did respond nicely both in terms of the condition of the wound bed as well as surface area. There is still some drainage coming from the wound but not a lot there does not appear to be any infection 4/15; we have been using Hydrofera Blue. He continues to have nice rims of epithelialization on the right greater than the left. The left the epithelialization is coming from the tip of his heel. There is moderate drainage. In this that concerns me about a total contact cast. There is no evidence of infection 4/29; patient has been using Hydrofera Blue with dressing changes. He has no complaints or issues today. 5/5; using Hydrofera Blue. I actually think that he looks marginally better than the last time I saw this 3 weeks ago. There are rims of epithelialization on the left thumb coming from the medial side on the right. Using Hydrofera Blue 5/12; using Hydrofera Blue. These continue to make improvements in surface area. His drainage was not listed as severe I therefore went ahead and put a cast on the left foot. Right foot we will continue to dress his previous 5/16; back for first total contact cast change. He did not tolerate this particularly well cast injury on the anterior tibia among other issues. Difficulty sleeping. I talked him about this in some detail and afterwards is elected to continue. I  told him I would like to have a cast on for 3 weeks to see if this is going to help at all. I think he agreed 5/19; I think the wound is better. There is no tunneling towards his midfoot. The undermining medially also looks better. He has a rim of new skin distally. I think we are making progress here. The area on the left also continues to look somewhat better to me using Hydrofera Blue. He has a list of complaints about the cast but none of them seem serious 5/26; patient presents for 1 week follow-up. He has been using a total contact cast and tolerating this well. Hydrofera Blue is the main dressing used. He denies signs of infection. 6/2 Hydrofera Blue total contact cast on the left. These were large ulcers that formed in intensive care unit where the patient was recovering from [place]. May have had something to do with being ventilated in an upright positiono Pressors etc. We have been able to get the areas down considerably and a viable surface. There is some epithelialization in both sides. Note made of drainage 6/9; changed to [facility] last time because of drainage. He arrives with better looking surfaces and dimensions on the left than the right. Paradoxically the right actually probes more towards his midfoot the left is largely close down but both of these look improved. Using a total contact cast on the left [name], [name] ([id number]) [phone].pdf Page 9 of 15 6/16; complex wounds on his bilateral plantar heels which were initially pressure injury from a stay in the ICU with COVID. We have been using silver alginate most recently. His dimensions of come in quite dramatically however not recently. We have been putting the left foot in a total contact cast 6/23; complex wounds on the bilateral plantar heels. I been putting the left in the cast paradoxically the area on the right is the one that is going towards closure at a faster rate. Quite a bit of  drainage on the left. The  patient went to see Dr. Amalia Hailey who said he was going to standby for skin grafts. I had actually considered sending him for skin grafts however he would be mandatorily off his feet for a period of weeks to months. I am thinking that the area on the right is going to close on its own the area on the left has been more stubborn even though we have him in a total contact cast 6/30; took him out of a total contact cast last week is the right heel seem to be making better progress than the left where I was placing the cast. We are using silver alginate. Both wounds are smaller right greater than left 7/12; both wounds look as though they are making some progress. We are using silver alginate. Heel offloading boots 7/26; very gradual progress especially on the right. Using silver alginate. He is wearing heel offloading boots 8/18; he continues to close these wounds down very gradually. Using silver alginate. The problem polymen being definitive about this is areas of what appears to be callus around the margins. This is not a 100% of the area but certainly sizable especially on the right 9/1; bilateral plantar feet wounds secondary to prolonged pressure while being ventilated for COVID-19 in an upright position. Essentially pressure ulcers on the bottom of his feet. He is made substantial progress using silver alginate. 9/14; bilateral plantar feet wounds secondary to prolonged pressure. Making progress using silver alginate. 9/29 bilateral plantar feet wounds secondary to prolonged pressure. I changed him to Iodoflex last week. MolecuLight showing reddened blush fluorescence 10/11; patient presents for follow-up. He has no issues or complaints today. He denies signs of infection. He continues to use Iodoflex and antibiotic ointment to the wound beds. 10/27; 2-week follow-up. No evidence of infection. He has callus and thick dry skin around the wound margins we have been using  Iodoflex and Bactroban which was in response to a moderate left MolecuLight reddish blush fluorescence. 11/10; 2-week follow-up. Wound margins again have thick callus however the measurements of the actual wound sites are a lot smaller. Everything looks reasonably healthy here. We have been using Iodoflex He was approved for prime matrix but I have elected to delay this given the improvement in the surface area. Hopefully I will not regret that decision as were getting close to the end of the year in terms of insurance payment 12/8; 2-week follow-up. Wounds are generally smaller in size. These were initially substantial wounds extending into the forefoot all the way into the heel on the bilateral plantar feet. They are now both located on the plantar heel distal aspect both of these have a lot of callus around the wounds I used a #5 curette to remove this on the right and the left also some subcutaneous debris to try and get the wound edges were using Iodoflex. He has heel offloading shoe 12/22; 2-week follow-up. Not really much improvement. He has thick callus around the outer edges of both wounds. I remove this there is some nonviable subcutaneous tissue as well. We have been using Iodoflex. Her intake nurse and myself spontaneously thought of a total contact cast I went back in May. At that time we really were not seeing much of an improvement with a cast although the wound was in a much different situation I would like to retry this in 2 weeks and I discussed this with the patient 08/12/2021; the patient has had some improvement with the Iodoflex. The the area on the  left heel plantar more improved than the right. I had to put him in a total contact cast on the left although I decided to put that off for 2 weeks. I am going to change his primary dressing to silver collagen. I think in both areas he has had some improvement most of the healing seems to be more proximal in the heel. The wounds are in  the mid aspect. A lot of thick callus on the right heel however. 1/19; we are using silver collagen on both plantar heel areas. He has had some improvement today. The left did not require any debridement. He still had some eschar on the right that was debrided but both seem to have contracted. I did not put it total contact cast on him today 2/2 we have been using silver collagen. The area on the right plantar heel has areas that appear to be epithelialized interspersed with dry flaking callus and dry skin. I removed this. This really looks better than on the other side. On the left still a large area with raised edges and debris on the surface. 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 He has no issues or complaints today. 2/9; not much change in the left foot wound with 1 week of a cast we are using silver collagen. Silver collagen on the right side. The right side has been the better wound surface. We will reapply the total contact cast on the left 2/16; not much improvement on either side I been using silver collagen with a total contact cast on the left. I'm changing the Hydrofera Blue still with a total contact cast on the left 2/23; some improvement on both sides. Disappointing that he has thick callus around the area that we are putting in a total contact cast on the left. We've been using Hydrofera Blue on both wound areas. This is a man who at essentially pressure ulcers in addition to ischemia caused by medications to support his blood pressure (pressors) in the ICU. He was being ventilated in the standing position for severe Covid. A Shiley the wounds extended across his entire foot but are now localized to his plantar heels bilaterally. We have made progress however neither areas healed. I continue to think the total contact cast is helped albeit  painstakingly slowly. He has never wanted a plastic surgery consult although I don't know that they would be interested in grafting in area in this location. 10/07/2021: Continued improvement bilaterally. There is still some callus around the left wound, despite the total contact cast. He has some increased pain in his right midfoot around 1 particular area. This has been painful in the past but seems to be a little bit worse. When his cast was removed today, there was an area on the heel of the left foot that looks a bit macerated. He is also complaining of pain in his left thigh and hip which he thinks is secondary to the limb length discrepancy caused by the cast. 10/14/2021: He continues to improve. A little bit less callus around the left wound. He continues to endorse pain in his right midfoot, but this is not as significant as it was last week. The maceration on his left heel is improved. 10/21/2021: Continued improvement to both wounds. The maceration on his left heel is no longer evident. Less callus bilaterally. Epithelialization progressing.  10/28/2021: Significant improvement this week. The right sided wound is nearly closed with just a small open area at the middle. No maceration seen on the left heel. Continued epithelialization on both sides. No concern for infection. 11/04/2021: T oday, the wounds were measured a little bit differently and come out as larger, but I actually think they are about the same to potentially even smaller, particularly on the left. He continues to accumulate some callus on the right. 11/11/2021: T oday, the patient is expressing some concern that the left wound, despite being in the total contact cast, is not progressing at the same rate as the 1 on the right. He is interested in trying a week without the cast to see how the wound does. The wounds are roughly the same size as last week, with the right perhaps being a little bit smaller. He continues to build up callus  on both sites. 11/18/2021: Last week, I permitted the patient to go without his total contact cast, just to see if the cast was really making any difference. Today, both wounds have deteriorated to some extent, suggesting that the cast is providing benefit, at least on the left. Both are larger and have accumulated callus, slough, and other debris. 11/26/2021: I debrided both wounds quite aggressively last week in an effort to stimulate the healing cascade. This appears to have been effective as the left sided wound is a full centimeter shorter in length. Although the right was measured slightly larger, on inspection, it looks as though an area of epithelialized tissue was included in the measurements. We have been using PolyMem Ag on the wound surfaces with a total contact cast to the left. 12/02/2021: It appears that the intake personnel are including epithelialized tissue in his wound measurements; the right wound is almost completely epithelialized; there is just a crater at the proximal midfoot with some open areas. On the left, he has built up some callus, but the overall wound surface looks [name], [name] ([id number]) [phone].pdf Page 10 of 15 good. There is some senescent skin around the wound margin. He has been in PolyMem Ag bilaterally with a total contact cast on the left. 12/09/2021: The right wound is nearly closed; there is just a small open area at the mid calcaneus. On the left, the wound is smaller with minimal callus buildup. No significant drainage. 12/16/2021: The right calcaneal wound remains minimally open at the mid calcaneus; the rest has epithelialized. On the left, the wound is also a little bit smaller. There is some senescent tissue on the periphery. He is getting his first application of a trial skin substitute called Vendaje today. 12/23/2021: The wound on his right calcaneus is nearly closed; there is just a small area at the most distal aspect of  the calcaneus that is open. On the left, the area where we applied to the skin substitute has a healthier-looking bed of granulation tissue. The wound dimensions are not significantly different on this side but the wound surface is improved. 12/30/2021: The wound on the right calcaneus has not changed significantly aside from some accumulation of callus. On the left, the open area is smaller and continues to have an improved surface. He continues to accumulate callus around the wound. He is here for his third application of Vendaje. 01/06/2022: The right calcaneal wound is down to just a couple of millimeters. He continues to accumulate periwound callus. He unfortunately got his cast wet earlier in the week and his left foot is  macerated, resulting in some superficial skin loss just distal to the open wound. The open wound itself, however, is much smaller and has a healthier appearing surface. He is here for his fourth application of Vendaje. 01/13/2022: The right calcaneal wound is about the same. Unfortunately, once again, his cast got wet and his foot is again macerated. This is caused the left calcaneal wound to enlarge. He is here for his fifth application of Vendaje. 01/20/2022: The right calcaneal wound is very small. There is some periwound callus accumulation. He purchased a new cast protector last week and this has been effective in avoiding the maceration that has been occurring on the left. The left calcaneal wound is narrower and has a healthy and viable-appearing surface. He is here for his 6 application of Vendaje. 01/27/2022: The right calcaneal wound is down to just a pinhole. There is some periwound slough and callus. On the left, the wound is narrower and shorter by about a centimeter. The surface is robust and viable-appearing. Unfortunately, the rep for the trial skin substitute product did not provide any for [place] to use today. 02/04/2022: The right calcaneal wound remains unchanged.  There is more accumulated callus. On the left, although the intake nurse measured it a little bit longer, it looks about the same to me. The surface has a layer of slough, but underneath this, there is good granulation tissue. 02/10/2022: The right calcaneus wound is nearly closed. There is still some callus that builds up around the site. The left side looks about the same in terms of dimensions, but the surface is more robust and vital-appearing. 02/16/2022: The area of the right calcaneus that was nearly closed last week has closed, but there is a small opening at the mid foot where it looks like some moisture got retained and caused some reopening. The left foot wound is narrower and shallower. Both sites have a fair amount of periwound callus and eschar. 02/24/2022: The small midfoot opening on the right calcaneus is a little bit smaller today. The left foot wound is narrower and shallower. He continues to accumulate periwound callus. No concern for infection. 03/01/2022: The patient came to clinic early because he showered and got his cast wet. Fortunately, there is no significant maceration to his foot but the callus softened and it looks like the wound on his left calcaneus may be a little bit wider. The wound on his right calcaneus is just a narrow slit. Continued accumulation of periwound callus bilaterally. 03/08/2022: The wound on his right calcaneus is very nearly closed, just a small pinpoint opening under a bit of eschar; the left wound has come in quite a bit, as well. It is narrower and shorter than at our last visit. Still with accumulated callus and eschar bilaterally. 03/17/2022: The right calcaneal wound is healed. The left wound is smaller and the surface itself is very clean, but there is some blue-green staining on the periwound callus, concerning for Pseudomonas aeruginosa. 03/23/2022: The right calcaneal wound remains closed. The left wound continues to contract. No further blue-green  staining. Small amount of callus and slough accumulation. 03/28/2022: He came in early today because he had gotten his cast wet. On inspection, the wound itself did not get wet or macerated, just a little bit of the forefoot. The wound itself is basically unchanged. 04/07/2022: The right foot wound remains closed. The left wound is the smallest that I have seen it to date. It is narrower and shorter. It still continues to  accumulate slough on the surface. 04/15/2022: There is a band of epithelium now dividing the small left plantar foot wound in 2. There is still some slough on the surface. 04/21/2022: The wound continues to narrow. Just a little bit of slough on the surface. He seems to be responding well to endoform. 04/28/2022: Continued slow contraction of the wound. There is a little slough on the surface and some periwound callus. We have been using endoform and total contact cast. 05/05/2022: The wound appears to have stalled. There is slough and some periwound eschar/callus. No concern for infection, however. 05/12/2022: Unfortunately, his right foot has reopened. It is located at the most posterior aspect of his surgical incision. The area was noted to have drainage coming from it when his padding was removed today. Underneath some callus and senescent skin, there is an opening. No purulent drainage or malodor. On the left foot, the wound is again unchanged. There is some light blue staining on the callus, but no malodor or purulent drainage. 10/13; right and left heel remanence of extensive plantar foot wounds. These are better than I remember by quite a big margin however he is still left with wounds on the left plantar heel and the right plantar heel. Been using endoform bilaterally. A culture was done that showed apparently Pseudomonas but we are still waiting for the [facility] antibiotic to use gentamicin today. He is still very active by description I am not sure about the offloading of his  noncasted right foot 10/20; both wounds right and left heel debrided not much change from last week. [employer] has arrived which is linezolid, gentamicin and ciprofloxacin we will use this with endoform. T contact cast on the left otal 06/02/2022: Both wounds are smaller today. There is still a fair amount of callus buildup around the right foot ulcer. The left is more superficial and nearly flush with the surrounding tissues. Also with slough and eschar buildup. 06/10/2022: The right sided wound appears to be nearly closed, if not completely so, although it is somewhat difficult to tell given the abnormal tissue and scarring in his foot. There is a fair amount of callus and crust accumulation. On the left, the wound looks about the same, again with callus and slough. He has an appointment next Thursday with Dr. Loel Lofty in podiatry; I am hopeful that there may be some reconstructive options available for Mr. Brandel. 06/16/2022: Both wounds have some eschar and callus accumulation. The right sided wound is extremely narrow and barely open; the left is narrower than last week. There is a little bit of slough. He has his appointment in podiatry later today. 06/23/2022: The patient met with Dr. Loel Lofty last week and unfortunately, there are no reconstructive options that he believes would be helpful. He did order [name], [name] ([id number]) [id number] Page 11 of 15 an MRI to evaluate for osteomyelitis and fortunately, none was seen. The left sided wound is a little bit shorter and narrower today. The right sided wound is about the same. There is callus and eschar accumulation bilaterally. 06/29/2022: Both feet have improved from last week. There is epithelium making a valiant effort to creep across the surface on the left. The right side looks like it got a little dry and the deep crevasse in his midfoot has cracked. Both have eschar and there is some slough on the  left. 07/07/2022: Both feet have improved. There is epithelium completely covering the calcaneus on the right with just a small opening in the  crevasse in his midfoot. On the left, the open area of tissue is smaller but he continues to build up callus/eschar and slough. 07/15/2022: The opening in the midfoot on the right is about the same size, covered with eschar and a little bit of slough. The open portion of the left wound is narrower and shorter with a bit of slough buildup. He admits to being on his feet more than recommended. 12/14; as far as I can tell everything on the right foot is closed. There is some eschar I removed some of this I cannot identify any open wound here. As usual this will be a very vulnerable area going forward. On the left this looks really quite healthy. I was pleasantly surprised to see how good this looked. Wound is certainly smaller and there appears to be healthy epithelialization. He has been using Promogran on the right and endoform on the left. He has been offloading the right foot with a heel offloading boot and he has a running shoe on the right foot 08/04/2022: The right foot remains closed. He has a thick cushioned insole in his sneaker. The left sided wound is smaller with just some slough and eschar accumulation. He is wearing the heel off loader on this foot. 08/15/2022: The right foot remains closed. The left wound has narrowed further. There is some slough and eschar accumulation. 08/25/2022: We put him in a peg assist shoe insert and as a result, he has more epithelialization of the ulcer with minimal slough and eschar accumulation. 09/01/2022: The wound is smaller by about half this week. Still with some slough on the surface. The peg assist shoe seems to be doing a remarkable job of adequately offloading the site. 09/08/2022: There is a little bit more epithelium coming in. There is some slough and callus buildup. 09/16/2022: The wound measures about the same  size, but the epithelium that has grown in looks more robust and stronger. There is some slough and callus buildup. 09/23/2022: The wound remains about the same size. The skin edges are looking rather senescent. 09/29/2022: The aggressive debridement I performed last week seems to have been effective. The wound is smaller and has significantly less slough accumulation. 10/06/2022: There has been quite a bit of epithelialization since last week. There are still some open areas with slough accumulation. There is some callus buildup around the perimeter. 10/13/2022: Continued improvement. Minimal callus accumulation. Patient History Information obtained from Patient. Family History Cancer - Maternal Grandparents, Diabetes - Father,Paternal Grandparents, Heart Disease - Maternal Grandparents, Hypertension - Father,Paternal Grandparents, Lung Disease - Siblings, No family history of Hereditary Spherocytosis, Kidney Disease, Seizures, Stroke, Thyroid Problems, Tuberculosis. Social History Never smoker, Marital Status - Married, Alcohol Use - Never, Drug Use - No History, Caffeine Use - Daily - tea, soda. Medical History Eyes Denies history of Cataracts, Glaucoma, Optic Neuritis Ear/Nose/Mouth/Throat Denies history of Chronic sinus problems/congestion, Middle ear problems Hematologic/Lymphatic Denies history of Anemia, Hemophilia, Human Immunodeficiency Virus, Lymphedema, Sickle Cell Disease Respiratory Patient has history of Asthma Denies history of Aspiration, Chronic Obstructive Pulmonary Disease (COPD), Pneumothorax, Sleep Apnea, Tuberculosis Cardiovascular Patient has history of Angina - with COVID, Hypertension Denies history of Arrhythmia, Congestive Heart Failure, Coronary Artery Disease, Deep Vein Thrombosis, Hypotension, Myocardial Infarction, Peripheral Arterial Disease, Peripheral Venous Disease, Phlebitis, Vasculitis Gastrointestinal Denies history of Cirrhosis , Colitis, Crohnoos,  Hepatitis A, Hepatitis B, Hepatitis C Endocrine Denies history of Type I Diabetes, Type II Diabetes Genitourinary Denies history of End Stage Renal Disease Immunological Denies  history of Lupus Erythematosus, Raynaudoos, Scleroderma Integumentary (Skin) Denies history of History of Burn Musculoskeletal Denies history of Gout, Rheumatoid Arthritis, Osteoarthritis, Osteomyelitis Neurologic Denies history of Dementia, Neuropathy, Quadriplegia, Paraplegia, Seizure Disorder Oncologic Denies history of Received Chemotherapy, Received Radiation Psychiatric Denies history of Anorexia/bulimia, Confinement Anxiety Hospitalization/Surgery History - COVID PNA 07/22/2019- 11/14/2019. - 03/27/2020 wound debridement/ skin graft. Medical A Surgical History Notes nd LOUISE, MCCULLAGH ([id number]) [id number] Page 12 of 15 Constitutional Symptoms (General Health) COVID PNA 07/22/2019-11/14/2019 [facility] ECMO, foot drop left foot , Genitourinary kidney stone Psychiatric anxiety Objective Constitutional no acute distress. Vitals Time Taken: 11:28 AM, Height: 69 in, Weight: 280 lbs, BMI: 41.3, Temperature: 98.1 F, Pulse: 86 bpm, Respiratory Rate: 20 breaths/min, Blood Pressure: 133/70 mmHg. Respiratory Normal work of breathing on room air. General Notes: 10/13/2022: The wound continues to contract. Minimal slough accumulation and very little callus buildup. Integumentary (Hair, Skin) Wound #2 status is Open. Original cause of wound was Pressure Injury. The date acquired was: 10/07/2019. The wound has been in treatment 117 weeks. The wound is located on the Left Calcaneus. The wound measures 0.2cm length x 0.2cm width x 0.1cm depth; 0.031cm^2 area and 0.003cm^3 volume. There is Fat Layer (Subcutaneous Tissue) exposed. There is no tunneling or undermining noted. There is a small amount of serosanguineous drainage noted. The wound margin is thickened. There is large (67-100%) red,  pink granulation within the wound bed. There is no necrotic tissue within the wound bed. The periwound skin appearance had no abnormalities noted for moisture. The periwound skin appearance had no abnormalities noted for color. The periwound skin appearance exhibited: Callus, Scarring. Periwound temperature was noted as No Abnormality. Assessment Active Problems ICD-10 Non-pressure chronic ulcer of other part of left foot with fat layer exposed Procedures Wound #2 Pre-procedure diagnosis of Wound #2 is a Pressure Ulcer located on the Left Calcaneus . There was a Selective/Open Wound Skin/Epidermis Debridement with a total area of 1 sq cm performed by Fredirick Maudlin, MD. With the following instrument(s): Curette to remove Non-Viable tissue/material. Material removed includes Callus, Slough, Skin: Dermis, and Skin: Epidermis after achieving pain control using Lidocaine 4% Topical Solution. A time out was conducted at 12:00, prior to the start of the procedure. A Moderate amount of bleeding was controlled with Pressure. The procedure was tolerated well with a pain level of 0 throughout and a pain level of 0 following the procedure. Post Debridement Measurements: 1cm length x 1cm width x 0.1cm depth; 0.079cm^3 volume. Post debridement Stage noted as Category/Stage III. Character of Wound/Ulcer Post Debridement is improved. Post procedure Diagnosis Wound #2: Same as Pre-Procedure Plan Follow-up Appointments: Return Appointment in 1 week. - Dr. [name] (Dr. [facility] [place] covering) 1030 10/20/2022 Return Appointment in 2 weeks. - Dr. [name] 10/27/2022 1015 Room 3 Anesthetic: Wound #2 Left Calcaneus: (In clinic) Topical Lidocaine 4% applied to wound bed - In clinic Bathing/ Shower/ Hygiene: May shower and wash wound with soap and water. Edema Control - Lymphedema / SCD / Other: Avoid standing for long periods of time. Moisturize legs daily. - as needed Off-Loading: Other: - keep pressure off of the  bottom of your feet. Elevate legs throughout the day. Use the Shoe with the PegAssist off-loading insole [name], [name] ([id number]) [id number].pdf Page 13 of 15 Additional Orders / Instructions: Follow Nutritious Diet - Try to get 70-100 grams of Protein a day+ WOUND #2: - Calcaneus Wound Laterality: Left Cleanser: Normal Saline (Generic) Every Other Day/30 Days Discharge Instructions: Cleanse the wound with Normal  Saline prior to applying a clean dressing using gauze sponges, not tissue or cotton balls. Cleanser: Wound Cleanser (Generic) Every Other Day/30 Days Discharge Instructions: Cleanse the wound with wound cleanser prior to applying a clean dressing using gauze sponges, not tissue or cotton balls. Topical: compounding topical antibiotics from [place] Every Other Day/30 Days Discharge Instructions: apply directly to wound bed. Prim Dressing: Endoform 2x2 in (Generic) Every Other Day/30 Days ary Discharge Instructions: Moisten with saline or Hydrogel Secondary Dressing: Optifoam Non-Adhesive Dressing, 4x4 in (Generic) Every Other Day/30 Days Discharge Instructions: Apply over primary dressing as directed. Secondary Dressing: Woven Gauze Sponge, Non-Sterile 4x4 in (Generic) Every Other Day/30 Days Discharge Instructions: Apply over primary dressing as directed. Secured With: 53M Medipore H Soft Cloth Surgical T ape, 4 x 10 (in/yd) (Generic) Every Other Day/30 Days Discharge Instructions: Secure with tape as directed. Com pression Wrap: Kerlix Roll 4.5x3.1 (in/yd) Every Other Day/30 Days Discharge Instructions: Apply Kerlix and Coban compression as directed. 10/13/2022: The wound continues to contract. Minimal slough accumulation and very little callus buildup. I used a curette to debride callus and slough from the wound. We will continue [facility] topical antibiotic compound with endoform. Follow-up in 1 week. Electronic Signature(s) Signed: 10/13/2022  12:39:55 PM By: Fredirick Maudlin MD FACS Entered By: Fredirick Maudlin on 10/13/2022 12:39:54 -------------------------------------------------------------------------------- HxROS Details Patient Name: Date of Service: [name], [place]. 10/13/2022 11:00 A M Medical Record Number: [id number] Patient Account Number: [id number] Date of Birth/Sex: Treating RN: Apr 13, 1976 (47 y.o. M) Primary Care Provider: [name] Other Clinician: Referring Provider: Treating Provider/Extender: [name] in Treatment: 117 Information Obtained From Patient Constitutional Symptoms (General Health) Medical History: Past Medical History Notes: COVID PNA 07/22/2019-11/14/2019 [facility] ECMO, foot drop left foot , Eyes Medical History: Negative for: Cataracts; Glaucoma; Optic Neuritis Ear/Nose/Mouth/Throat Medical History: Negative for: Chronic sinus problems/congestion; Middle ear problems Hematologic/Lymphatic Medical History: Negative for: Anemia; Hemophilia; Human Immunodeficiency Virus; Lymphedema; Sickle Cell Disease Respiratory Medical History: Positive for: Asthma Negative for: Aspiration; Chronic Obstructive Pulmonary Disease (COPD); Pneumothorax; Sleep Apnea; Tuberculosis Cardiovascular Medical History: Positive for: Angina - with COVID; Hypertension Negative for: Arrhythmia; Congestive Heart Failure; Coronary Artery Disease; Deep Vein Thrombosis; Hypotension; Myocardial Infarction; Peripheral Arterial Disease; Peripheral Venous Disease; Phlebitis; Vasculitis [name], [name] ([id number]) [id number].pdf Page 14 of 15 Gastrointestinal Medical History: Negative for: Cirrhosis ; Colitis; Crohns; Hepatitis A; Hepatitis B; Hepatitis C Endocrine Medical History: Negative for: Type I Diabetes; Type II Diabetes Genitourinary Medical History: Negative for: End Stage Renal Disease Past Medical History Notes: kidney stone Immunological Medical  History: Negative for: Lupus Erythematosus; Raynauds; Scleroderma Integumentary (Skin) Medical History: Negative for: History of Burn Musculoskeletal Medical History: Negative for: Gout; Rheumatoid Arthritis; Osteoarthritis; Osteomyelitis Neurologic Medical History: Negative for: Dementia; Neuropathy; Quadriplegia; Paraplegia; Seizure Disorder Oncologic Medical History: Negative for: Received Chemotherapy; Received Radiation Psychiatric Medical History: Negative for: Anorexia/bulimia; Confinement Anxiety Past Medical History Notes: anxiety Immunizations Pneumococcal Vaccine: Received Pneumococcal Vaccination: No Implantable Devices None Hospitalization / Surgery History Type of Hospitalization/Surgery COVID PNA 07/22/2019- 11/14/2019 03/27/2020 wound debridement/ skin graft Family and Social History Cancer: Yes - Maternal Grandparents; Diabetes: Yes - Father,Paternal Grandparents; Heart Disease: Yes - Maternal Grandparents; Hereditary Spherocytosis: No; Hypertension: Yes - Father,Paternal Grandparents; Kidney Disease: No; Lung Disease: Yes - Siblings; Seizures: No; Stroke: No; Thyroid Problems: No; Tuberculosis: No; Never smoker; Marital Status - Married; Alcohol Use: Never; Drug Use: No History; Caffeine Use: Daily - tea, soda; Financial Concerns: No; Food, Clothing or Shelter Needs: No; Support  System Lacking: No; Transportation Concerns: No Electronic Signature(s) Signed: 10/13/2022 1:02:33 PM By: Fredirick Maudlin MD FACS Entered By: Fredirick Maudlin on 10/13/2022 12:38:27 [name] ([id number] Page 15 of 15 -------------------------------------------------------------------------------- SuperBill Details Patient Name: Date of Service: [name] [place]. 10/13/2022 Medical Record Number: [id number] Patient Account Number: [id number] Date of Birth/Sex: Treating RN: 1975-09-22 (47 y.o. [name], [name] Primary Care Provider: [name] Other Clinician: Referring Provider: Treating Provider/Extender: [name] in Treatment: 117 Diagnosis Coding ICD-10 Codes Code Description [phone] Non-pressure chronic ulcer of other part of left foot with fat layer exposed Facility Procedures : CPT4 Code: [id number] Description: [phone] - [facility] WOUND 1ST 20 SQ CM OR < ICD-10 Diagnosis Description L97.522 Non-pressure chronic ulcer of other part of left foot with fat layer exposed Modifier: Quantity: 1 Physician Procedures : CPT4 Code Description Modifier [id number] - WC PHYS LEVEL 4 - EST PT 25 ICD-10 Diagnosis Description L97.522 Non-pressure chronic ulcer of other part of left foot with fat layer exposed Quantity: 1 : [id number] - WC PHYS DEBR WO ANESTH 20 SQ CM ICD-10 Diagnosis Description L97.522 Non-pressure chronic ulcer of other part of left foot with fat layer exposed Quantity: 1 Electronic Signature(s) Signed: 10/13/2022 12:40:48 PM By: Fredirick Maudlin MD FACS Entered By: Fredirick Maudlin on 10/13/2022 12:40:48

## 2022-10-15 NOTE — Progress Notes (Signed)
AVIGDOR, WROBLE ([id number]) [phone].pdf Page 1 of 7 Visit Report for 10/13/2022 Arrival Information Details Patient Name: Date of Service: [name], [place] 10/13/2022 11:00 A M Medical Record Number: [id number] Patient Account Number: [id number] Date of Birth/Sex: Treating RN: [birth year] (47 y.o. M) Primary Care Adasyn Mcadams: [name] Other Clinician: Referring Delrico Minehart: Treating Shareta Fishbaugh/Extender: [name] in Treatment: 77 Visit Information History Since Last Visit All ordered tests and consults were completed: No Patient Arrived: Wheel Chair Added or deleted any medications: No Arrival Time: 11:27 Any new allergies or adverse reactions: No Accompanied By: wife Had a fall or experienced change in No Transfer Assistance: None activities of daily living that may affect Patient Identification Verified: Yes risk of falls: Secondary Verification Process Completed: Yes Signs or symptoms of abuse/neglect since last visito No Patient Requires Transmission-Based Precautions: No Hospitalized since last visit: No Patient Has Alerts: No Implantable device outside of the clinic excluding No cellular tissue based products placed in the center since last visit: Pain Present Now: No Electronic Signature(s) Signed: 10/13/2022 11:42:40 AM By: Worthy Rancher Entered By: Worthy Rancher on 10/13/2022 11:28:27 -------------------------------------------------------------------------------- Encounter Discharge Information Details Patient Name: Date of Service: [name], [place]. 10/13/2022 11:00 A M Medical Record Number: [id number] Patient Account Number: [id number] Date of Birth/Sex: Treating RN: 01/13/1976 (47 y.o. [name] Primary Care Benino Korinek: [name] Other Clinician: Referring Lundynn Cohoon: Treating Ming Kunka/Extender: [name] in Treatment: 117 Encounter Discharge Information Items Post Procedure Vitals Discharge  Condition: Stable Temperature (F): 98.1 Ambulatory Status: Wheelchair Pulse (bpm): 86 Discharge Destination: Home Respiratory Rate (breaths/min): 20 Transportation: Private Auto Blood Pressure (mmHg): 133/70 Accompanied By: wife Schedule Follow-up Appointment: Yes Clinical Summary of Care: Electronic Signature(s) Signed: 10/13/2022 4:49:59 PM By: [name] RN, BSN Entered By: [name] on 10/13/2022 12:14:01 -------------------------------------------------------------------------------- Lower Extremity Assessment Details Patient Name: Date of Service: [name] [name], [place]. 10/13/2022 11:00 A M Medical Record Number: [id number] Patient Account Number: [id number] Date of Birth/Sex: Treating RN: 06-23-1976 (47 y.o. [name] Primary Care Draya Felker: [name] Other Clinician: Referring Paitlyn Mcclatchey: Treating Marchell Froman/Extender: [name] in Treatment: 117 Edema Assessment Assessed: [name]: Yes] [name]: No] G[LeftSalvadore Oxford [id number] [[id number].pdf Page 2 of 7] Edema: [Left: N] [Right: o] Calf Left: Right: Point of Measurement: 29 cm From Medial Instep 44 cm Ankle Left: Right: Point of Measurement: 9 cm From Medial Instep 24 cm Vascular Assessment Pulses: Dorsalis Pedis Palpable: [Left:Yes] Electronic Signature(s) Signed: 10/13/2022 4:49:59 PM By: [name] RN, BSN Entered By: [name] on 10/13/2022 11:55:04 -------------------------------------------------------------------------------- Multi Wound Chart Details Patient Name: Date of Service: [name], [place] 10/13/2022 11:00 A M Medical Record Number: [id number] Patient Account Number: [id number] Date of Birth/Sex: Treating RN: [birth year] (47 y.o. M) Primary Care Eldoris Beiser: [name] Other Clinician: Referring Caulder Wehner: Treating Cullan Launer/Extender: [name] in Treatment: 117 Vital Signs Height(in): 69 Pulse(bpm):  86 Weight(lbs): 280 Blood Pressure(mmHg): 133/70 Body Mass Index(BMI): 41.3 Temperature(F): 98.1 Respiratory Rate(breaths/min): 20 [2:Photos:] [N/A:N/A] Left Calcaneus N/A N/A Wound Location: Pressure Injury N/A N/A Wounding Event: Pressure Ulcer N/A N/A Primary Etiology: Asthma, Angina, Hypertension N/A N/A Comorbid History: 10/07/2019 N/A N/A Date Acquired: 117 N/A N/A Weeks of Treatment: Open N/A N/A Wound Status: No N/A N/A Wound Recurrence: 0.2x0.2x0.1 N/A N/A Measurements L x W x D (cm) 0.031 N/A N/A A (cm) : rea 0.003 N/A N/A Volume (cm) : 99.90% N/A N/A % Reduction in A  rea: 100.00% N/A N/A % Reduction in Volume: Category/Stage III N/A N/A Classification: Small N/A N/A Exudate A mount: Serosanguineous N/A N/A Exudate Type: red, brown N/A N/A Exudate Color: Thickened N/A N/A Wound Margin: Large (67-100%) N/A N/A Granulation A mount: Red, Pink N/A N/A Granulation Quality: None Present (0%) N/A N/A Necrotic A mount: Fat Layer (Subcutaneous Tissue): Yes N/A N/A Exposed Structures: Fascia: No Tendon: No Muscle: No [name], [name] ([id number]) [phone].pdf Page 3 of 7 Joint: No Bone: No Large (67-100%) N/A N/A Epithelialization: Debridement - Selective/Open Wound N/A N/A Debridement: Pre-procedure Verification/Time Out 12:00 N/A N/A Taken: Lidocaine 4% T opical Solution N/A N/A Pain Control: Callus, Slough N/A N/A Tissue Debrided: Skin/Epidermis N/A N/A Level: 1 N/A N/A Debridement A (sq cm): rea Curette N/A N/A Instrument: Moderate N/A N/A Bleeding: Pressure N/A N/A Hemostasis A chieved: 0 N/A N/A Procedural Pain: 0 N/A N/A Post Procedural Pain: Procedure was tolerated well N/A N/A Debridement Treatment Response: 1x1x0.1 N/A N/A Post Debridement Measurements L x W x D (cm) 0.079 N/A N/A Post Debridement Volume: (cm) Category/Stage III N/A N/A Post Debridement Stage: Callus: Yes N/A N/A Periwound  Skin Texture: Scarring: Yes Maceration: Yes N/A N/A Periwound Skin Moisture: No Abnormalities Noted N/A N/A Periwound Skin Color: No Abnormality N/A N/A Temperature: Debridement N/A N/A Procedures Performed: Treatment Notes Wound #2 (Calcaneus) Wound Laterality: Left Cleanser Normal Saline Discharge Instruction: Cleanse the wound with Normal Saline prior [name] applying a clean dressing using gauze sponges, not tissue or cotton balls. Wound Cleanser Discharge Instruction: Cleanse the wound with wound cleanser prior [name] applying a clean dressing using gauze sponges, not tissue or cotton balls. Peri-Wound Care Topical compounding topical antibiotics from [facility] Discharge Instruction: apply directly [name] wound bed. Primary Dressing Endoform 2x2 in Discharge Instruction: Moisten with saline or Hydrogel Secondary Dressing Optifoam Non-Adhesive Dressing, 4x4 in Discharge Instruction: Apply over primary dressing as directed. Woven Gauze Sponge, Non-Sterile 4x4 in Discharge Instruction: Apply over primary dressing as directed. Secured With 48M Medipore H Soft Cloth Surgical T ape, 4 x 10 (in/yd) Discharge Instruction: Secure with tape as directed. Compression Wrap Kerlix Roll 4.5x3.1 (in/yd) Discharge Instruction: Apply Kerlix and Coban compression as directed. Compression Stockings Add-Ons Electronic Signature(s) Signed: 10/13/2022 12:36:45 PM By: Fredirick Maudlin MD FACS Entered By: Fredirick Maudlin on 10/13/2022 12:36:44 -------------------------------------------------------------------------------- Multi-Disciplinary Care Plan Details Patient Name: Date of Service: [name], [place] 10/13/2022 11:00 A M Medical Record Number: [id number] Patient Account Number: [id number] JUWAUN, CORRADINO ([id number]) [phone].pdf Page 4 of 7 Date of Birth/Sex: Treating RN: 1975/10/19 (47 y.o. [name] Primary Care Brance Dartt: Other Clinician: [name] Referring Kaseem Vastine: Treating Eliot Popper/Extender: [name] in Treatment: [place] reviewed with physician Active Inactive Wound/Skin Impairment Nursing Diagnoses: Impaired tissue integrity Knowledge deficit related [name] ulceration/compromised skin integrity Goals: Patient/caregiver will verbalize understanding of skin care regimen Date Initiated: 07/15/2020 Target Resolution Date: 03/08/2023 Goal Status: Active Ulcer/skin breakdown will have a volume reduction of 30% by week 4 Date Initiated: 07/15/2020 Date Inactivated: 08/20/2020 Target Resolution Date: 09/03/2020 Goal Status: Unmet Unmet Reason: no major changes. Ulcer/skin breakdown will heal within 14 weeks Date Initiated: 12/04/2020 Date Inactivated: 12/10/2020 Target Resolution Date: 12/10/2020 Unmet Reason: wounds still open at 14 Goal Status: Unmet weeks and today 21 weeks. Interventions: Assess patient/caregiver ability [name] obtain necessary supplies Assess patient/caregiver ability [name] perform ulcer/skin care regimen upon admission and as needed Assess ulceration(s) every visit Provide education on ulcer and skin care Treatment Activities:  Skin care regimen initiated : 07/15/2020 Topical wound management initiated : 07/15/2020 Notes: Electronic Signature(s) Signed: 10/13/2022 4:49:59 PM By: [name] RN, BSN Entered By: [name] on 10/13/2022 11:59:43 -------------------------------------------------------------------------------- Pain Assessment Details Patient Name: Date of Service: [name], [place]. 10/13/2022 11:00 A M Medical Record Number: [id number] Patient Account Number: [id number] Date of Birth/Sex: Treating RN: [birth year] (47 y.o. M) Primary Care Kamren Heskett: [name] Other Clinician: Referring Kaedence Connelly: Treating Clay Solum/Extender: [name] in Treatment: 117 Active Problems Location of Pain Severity and Description of  Pain Patient Has Paino Yes Site Locations Rate the pain. [name], [name] ([id number]) [phone].pdf Page 5 of 7 Rate the pain. Current Pain Level: 3 Worst Pain Level: 10 Least Pain Level: 0 Tolerable Pain Level: 1 Pain Management and Medication Current Pain Management: Electronic Signature(s) Signed: 10/13/2022 11:42:40 AM By: Worthy Rancher Entered By: Worthy Rancher on 10/13/2022 11:29:31 -------------------------------------------------------------------------------- Patient/Caregiver Education Details Patient Name: Date of Service: [name], [name] [place]. 3/7/2024andnbsp11:00 [place] Record Number: [id number] Patient Account Number: [id number] Date of Birth/Gender: Treating RN: 05-Jan-1976 (47 y.o. [name] Primary Care Physician: [name] Other Clinician: Referring Physician: Treating Physician/Extender: [name] in Treatment: 117 Education Assessment Education Provided [name]: Patient Education Topics Provided Wound/Skin Impairment: Handouts: Caring for Your Ulcer Methods: Explain/Verbal Responses: Reinforcements needed Electronic Signature(s) Signed: 10/13/2022 4:49:59 PM By: [name] RN, BSN Entered By: [name] on 10/13/2022 11:59:56 -------------------------------------------------------------------------------- Wound Assessment Details Patient Name: Date of Service: [name], [place] 10/13/2022 11:00 A M Medical Record Number: [id number] Patient Account Number: [id number] Date of Birth/Sex: Treating RN: 10/31/1975 (47 y.o. M) Primary Care Nihal Marzella: [name] Other Clinician: Referring Lajarvis Italiano: Treating Alyric Parkin/Extender: [name] in Treatment: 117 Wound Status Wound Number: 2 Primary Etiology: Pressure Ulcer Wound Location: Left Calcaneus Wound Status: Open Wounding Event: Pressure Injury Comorbid History: Asthma, Angina, Hypertension Date Acquired: 10/07/2019 [facility]  Treatment: [address] ([id number]) [phone].pdf Page 6 of 7 Clustered Wound: No Photos Wound Measurements Length: (cm) 0.2 Width: (cm) 0.2 Depth: (cm) 0.1 Area: (cm) 0.031 Volume: (cm) 0.003 % Reduction in Area: 99.9% % Reduction in Volume: 100% Epithelialization: Large (67-100%) Tunneling: No Undermining: No Wound Description Classification: Category/Stage III Wound Margin: Thickened Exudate Amount: Small Exudate Type: Serosanguineous Exudate Color: red, brown Foul Odor After Cleansing: No Slough/Fibrino No Wound Bed Granulation Amount: Large (67-100%) Exposed Structure Granulation Quality: Red, Pink Fascia Exposed: No Necrotic Amount: None Present (0%) Fat Layer (Subcutaneous Tissue) Exposed: Yes Tendon Exposed: No Muscle Exposed: No Joint Exposed: No Bone Exposed: No Periwound Skin Texture Texture Color No Abnormalities Noted: No No Abnormalities Noted: Yes Callus: Yes Temperature / Pain Scarring: Yes Temperature: No Abnormality Moisture No Abnormalities Noted: Yes Treatment Notes Wound #2 (Calcaneus) Wound Laterality: Left Cleanser Normal Saline Discharge Instruction: Cleanse the wound with Normal Saline prior [name] applying a clean dressing using gauze sponges, not tissue or cotton balls. Wound Cleanser Discharge Instruction: Cleanse the wound with wound cleanser prior [name] applying a clean dressing using gauze sponges, not tissue or cotton balls. Peri-Wound Care Topical compounding topical antibiotics from [facility] Discharge Instruction: apply directly [name] wound bed. Primary Dressing Endoform 2x2 in Discharge Instruction: Moisten with saline or Hydrogel Secondary Dressing Optifoam Non-Adhesive Dressing, 4x4 in Discharge Instruction: Apply over primary dressing as directed. Woven Gauze Sponge, Non-Sterile 4x4 in Discharge Instruction: Apply over primary dressing as directed. [name], [name] ([id number])  [phone].pdf Page 7 of 7 Secured  With 99M Medipore H Soft Cloth Surgical T ape, 4 x 10 (in/yd) Discharge Instruction: Secure with tape as directed. Compression Wrap Kerlix Roll 4.5x3.1 (in/yd) Discharge Instruction: Apply Kerlix and Coban compression as directed. Compression Stockings Add-Ons Electronic Signature(s) Signed: 10/13/2022 4:49:59 PM By: [name] RN, BSN Previous Signature: 10/13/2022 11:42:40 AM Version By: Worthy Rancher Entered By: [name] on 10/13/2022 11:56:49 -------------------------------------------------------------------------------- Vitals Details Patient Name: Date of Service: [name], [place] 10/13/2022 11:00 A M Medical Record Number: [id number] Patient Account Number: [id number] Date of Birth/Sex: Treating RN: [birth year] (47 y.o. M) Primary Care Chelsea Pedretti: [name] Other Clinician: Referring Mussa Groesbeck: Treating Kort Stettler/Extender: [name] in Treatment: 117 Vital Signs Time Taken: 11:28 Temperature (F): 98.1 Height (in): 69 Pulse (bpm): 86 Weight (lbs): 280 Respiratory Rate (breaths/min): 20 Body Mass Index (BMI): 41.3 Blood Pressure (mmHg): 133/70 Reference Range: 80 - 120 mg / dl Electronic Signature(s) Signed: 10/13/2022 11:42:40 AM By: Worthy Rancher Entered By: Worthy Rancher on 10/13/2022 11:28:49

## 2022-10-16 ENCOUNTER — Other Ambulatory Visit: Payer: Self-pay | Admitting: Behavioral Health

## 2022-10-16 DIAGNOSIS — F331 Major depressive disorder, recurrent, moderate: Secondary | ICD-10-CM

## 2022-10-16 DIAGNOSIS — F411 Generalized anxiety disorder: Secondary | ICD-10-CM

## 2022-10-16 DIAGNOSIS — F39 Unspecified mood [affective] disorder: Secondary | ICD-10-CM

## 2022-10-18 ENCOUNTER — Encounter: Payer: Self-pay | Admitting: Allergy & Immunology

## 2022-10-18 ENCOUNTER — Ambulatory Visit (INDEPENDENT_AMBULATORY_CARE_PROVIDER_SITE_OTHER): Payer: PPO | Admitting: Allergy & Immunology

## 2022-10-18 ENCOUNTER — Encounter (INDEPENDENT_AMBULATORY_CARE_PROVIDER_SITE_OTHER): Payer: Self-pay | Admitting: Family Medicine

## 2022-10-18 ENCOUNTER — Ambulatory Visit (INDEPENDENT_AMBULATORY_CARE_PROVIDER_SITE_OTHER): Payer: PPO | Admitting: Family Medicine

## 2022-10-18 ENCOUNTER — Other Ambulatory Visit: Payer: Self-pay

## 2022-10-18 VITALS — BP 138/88 | HR 76 | Temp 98.2°F | Resp 20 | Ht 69.0 in | Wt 314.0 lb

## 2022-10-18 VITALS — BP 149/86 | HR 82 | Temp 97.7°F | Ht 68.0 in | Wt 308.0 lb

## 2022-10-18 DIAGNOSIS — I10 Essential (primary) hypertension: Secondary | ICD-10-CM

## 2022-10-18 DIAGNOSIS — E119 Type 2 diabetes mellitus without complications: Secondary | ICD-10-CM

## 2022-10-18 DIAGNOSIS — E559 Vitamin D deficiency, unspecified: Secondary | ICD-10-CM

## 2022-10-18 DIAGNOSIS — F324 Major depressive disorder, single episode, in partial remission: Secondary | ICD-10-CM

## 2022-10-18 DIAGNOSIS — J302 Other seasonal allergic rhinitis: Secondary | ICD-10-CM | POA: Diagnosis not present

## 2022-10-18 DIAGNOSIS — J4540 Moderate persistent asthma, uncomplicated: Secondary | ICD-10-CM | POA: Diagnosis not present

## 2022-10-18 DIAGNOSIS — J3089 Other allergic rhinitis: Secondary | ICD-10-CM | POA: Diagnosis not present

## 2022-10-18 DIAGNOSIS — Z6841 Body Mass Index (BMI) 40.0 and over, adult: Secondary | ICD-10-CM | POA: Diagnosis not present

## 2022-10-18 HISTORY — DX: Type 2 diabetes mellitus without complications: E11.9

## 2022-10-18 HISTORY — DX: Vitamin D deficiency, unspecified: E55.9

## 2022-10-18 MED ORDER — MONTELUKAST SODIUM 10 MG PO TABS: 10.0000 mg | ORAL_TABLET | Freq: Every day | ORAL | 1 refills | Status: DC

## 2022-10-18 MED ORDER — BREZTRI AEROSPHERE 160-9-4.8 MCG/ACT IN AERO: 2.0000 | INHALATION_SPRAY | Freq: Two times a day (BID) | RESPIRATORY_TRACT | 5 refills | Status: DC

## 2022-10-18 MED ORDER — VITAMIN D (ERGOCALCIFEROL) 1.25 MG (50000 UNIT) PO CAPS: 50000.0000 [IU] | ORAL_CAPSULE | ORAL | 0 refills | Status: DC

## 2022-10-18 MED ORDER — TIRZEPATIDE 2.5 MG/0.5ML ~~LOC~~ SOAJ: 2.5000 mg | SUBCUTANEOUS | 0 refills | Status: DC

## 2022-10-18 MED ORDER — IPRATROPIUM BROMIDE 0.06 % NA SOLN: 2.0000 | Freq: Three times a day (TID) | NASAL | 5 refills | Status: DC

## 2022-10-18 MED ORDER — ALBUTEROL SULFATE HFA 108 (90 BASE) MCG/ACT IN AERS: INHALATION_SPRAY | RESPIRATORY_TRACT | 1 refills | Status: DC

## 2022-10-18 NOTE — Addendum Note (Signed)
Addended by: Valentina Shaggy on: 10/18/2022 03:03 PM   Modules accepted: Orders

## 2022-10-18 NOTE — Patient Instructions (Addendum)
1. Moderate persistent asthma, uncomplicated - Lung testing was stable compared to last time.  - We are going to start Tezspire to help with asthma control. - This is the newest one, but blocks pathways higher than the other biologics. - Tammy will reach out to discuss this with you.  - Daily controller medication(s):  Breztri two puffs twice daily with spacer - Prior to physical activity: albuterol 2 puffs 10-15 minutes before physical activity. - Rescue medications: albuterol 4 puffs every 4-6 hours as needed - Asthma control goals:  * Full participation in all desired activities (may need albuterol before activity) * Albuterol use two time or less a week on average (not counting use with activity) * Cough interfering with sleep two time or less a month * Oral steroids no more than once a year * No hospitalizations  2. Allergic rhinitis - well controlled (grass, weeds, ragweed, trees, dust mites, cat, and dog) - Continue with Nasacort one spray per nostril daily. - Restart the Atrovent nasal spray one spray per nostril every 8 hours as needed for runny nose.  - This Atrovent can make you over dry as well.  - Continue with cetirizine '10mg'$  daily.  3. Return in about 3 months (around 01/18/2023).    Please inform us of any Emergency Department visits, hospitalizations, or changes in symptoms. Call us before going to the ED for breathing or allergy symptoms since we might be able to fit you in for a sick visit. Feel free to contact us anytime with any questions, problems, or concerns.  It was a pleasure to see you and your family again today!   Websites that have reliable patient information: 1. American Academy of Asthma, Allergy, and Immunology: www.aaaai.org 2. Food Allergy Research and Education (FARE): foodallergy.org 3. Mothers of Asthmatics: http://www.asthmacommunitynetwork.org 4. American College of Allergy, Asthma, and Immunology: www.acaai.org   COVID-19 Vaccine  Information can be found at: ShippingScam.co.uk For questions related to vaccine distribution or appointments, please email [email or web address] or call [phone].   We realize that you might be concerned about having an allergic reaction to the COVID19 vaccines. To help with that concern, WE ARE OFFERING THE COVID19 VACCINES IN OUR OFFICE! Ask the front desk for dates!     "Like" [place] on Facebook and Instagram for our latest updates!      A healthy democracy works best when [employer] participate! Make sure you are registered to vote! If you have moved or changed any of your contact information, you will need to get this updated before voting!  In some cases, you MAY be able to register to vote online: [email or web address]

## 2022-10-18 NOTE — Assessment & Plan Note (Signed)
Peak A1c 6.7 in 2020 Has never used metformin Craving sugar/ starches with elevated fasting insulin on labs, reviewed today  Continue to read labels for added sugar, avoiding high sugar foods and drinks Limit starches and work on adherence on prescribed meal plan  Previously had GI upset on Ozempic. Begin Mounjaro 2.5 mg once weekly injection. Patient denies a personal or family history of pancreatitis, medullary thyroid carcinoma or multiple endocrine neoplasia type II. Recommend reviewing pen training video online.

## 2022-10-18 NOTE — Assessment & Plan Note (Signed)
Patient has struggled with compliance due to underlying depression, limited physical activity due to chronic left foot wounds managed by wound care.  We discussed options like the prep program for exercise at the [facility] but he declined.  We discussed use of antiobesity medications.  He is a good candidate for [facility] for both type 2 diabetes and obesity management. Recommend close follow-up with his PCP, mental health professionals. We discussed easy healthy meal options as opposed to eating out.  We briefly discussed the role for bariatric surgery.  He is contemplating gastric bypass if unable to obtain weight loss in the next 6 months.

## 2022-10-18 NOTE — Assessment & Plan Note (Signed)
New problem Reviewed labs from last visit Discussed complications from vitamin D deficiency  Last vitamin D Lab Results  Component Value Date   VD25OH 21.1 (L) 09/21/2022   Begin prescription vitamin D 50,000 IU once weekly.  Discontinue over-the-counter vitamin D.  Recheck level in 3 to 4 months

## 2022-10-18 NOTE — Assessment & Plan Note (Signed)
BP is elevated today, on metoprolol 25 mg bid + amlodopine 2.5 mg daily. Denies Headaches or chest pain Not monitoring Bps at home  Continue active plan for weight reduction Monitor BP readings at wound care Take all meds as directed

## 2022-10-18 NOTE — Progress Notes (Signed)
Office: [phone]  /  Fax: [phone]  WEIGHT SUMMARY AND BIOMETRICS  Vitals Temp: 97.7 F (36.5 C) BP: (!) 149/86 Pulse Rate: 82 SpO2: 91 %   Anthropometric Measurements Height: '5\' 8"'$  (1.727 m) Weight: (!) 308 lb (139.7 kg) BMI (Calculated): 46.84 Weight at Last Visit: 303lb Weight Lost Since Last Visit: +5 Starting Weight: 307lb Total Weight Loss (lbs): 0 lb (0 kg) Waist Measurement : 60.5 inches   Body Composition  Body Fat %: 47 % Fat Mass (lbs): 144.8 lbs Muscle Mass (lbs): 155.4 lbs Total Body Water (lbs): 125.4 lbs Visceral Fat Rating : 31   Other Clinical Data Fasting: no Labs: no Today's Visit #: 3 Starting Date: 09/07/22     HPI  Chief Complaint: OBESITY  [name] is here to discuss his progress with his obesity treatment plan. He is on the the Category 4 Plan and states he is following his eating plan approximately 35 % of the time. He states he is not exercising.    Interval History:  Since last office visit he up 5 pounds He is still eating out frequently Stress levels have been high and he is dealing with underlying depression Physical activity is limited due to his chronic left foot wounds, slow to heal He admits to overeating and poor food choices He did add in a protein shake daily  Pharmacotherapy: None currently  PHYSICAL EXAM:  Blood pressure (!) 149/86, pulse 82, temperature 97.7 F (36.5 C), height '5\' 8"'$  (1.727 m), weight (!) 308 lb (139.7 kg), SpO2 91 %. Body mass index is 46.83 kg/m.  General: He is overweight, cooperative, alert, well developed, and in no acute distress. PSYCH: Has normal mood, affect and thought process.   Lungs: Normal breathing effort, no conversational dyspnea.   ASSESSMENT AND PLAN  TREATMENT PLAN FOR OBESITY:  Recommended Dietary Goals  [name] is currently in the action stage of change. As such, his goal is to continue weight management plan. He has agreed to the Category 4  Plan.  Behavioral Intervention  We discussed the following Behavioral Modification Strategies today: increasing lean protein intake, increasing vegetables, increasing water intake, work on meal planning and easy cooking plans, decreasing eating out, consumption of processed foods, and making healthy choices when eating convenient foods, reading food labels , and work on managing stress, creating time for self-care and relaxation measures.  Additional resources provided today: NA  Recommended Physical Activity Goals  [name] has been advised to work up to 150 minutes of moderate intensity aerobic activity a week and strengthening exercises 2-3 times per week for cardiovascular health, weight loss maintenance and preservation of muscle mass.   He has agreed to increase physical activity in their day and reduce sedentary time (increase NEAT).   Pharmacotherapy changes for the treatment of obesity: Begin Mounjaro 2.5 mg once weekly injection for both type 2 diabetes and obesity management  ASSOCIATED CONDITIONS ADDRESSED TODAY  Type 2 diabetes mellitus without complication, without long-term current use of insulin (HCC) Assessment & Plan: Peak A1c 6.7 in 2020 Has never used metformin Craving sugar/ starches with elevated fasting insulin on labs, reviewed today  Continue to read labels for added sugar, avoiding high sugar foods and drinks Limit starches and work on adherence on prescribed meal plan  Previously had GI upset on Ozempic. Begin Mounjaro 2.5 mg once weekly injection. Patient denies a personal or family history of pancreatitis, medullary thyroid carcinoma or multiple endocrine neoplasia type II. Recommend reviewing pen training video online.  Orders: -     Tirzepatide; Inject 2.5 mg into the skin once a week.  Dispense: 2 mL; Refill: 0  Morbid obesity ([name]) Assessment & Plan: Patient has struggled with compliance due to underlying depression, limited physical activity due to  chronic left foot wounds managed by wound care.  We discussed options like the prep program for exercise at the [name] but he declined.  We discussed use of antiobesity medications.  He is a good candidate for [facility] for both type 2 diabetes and obesity management. Recommend close follow-up with his PCP, mental health professionals. We discussed easy healthy meal options as opposed to eating out.  We briefly discussed the role for bariatric surgery.  He is contemplating gastric bypass if unable to obtain weight loss in the next 6 months.   Primary hypertension Assessment & Plan: BP is elevated today, on metoprolol 25 mg bid + amlodopine 2.5 mg daily. Denies Headaches or chest pain Not monitoring Bps at home  Continue active plan for weight reduction Monitor BP readings at wound care Take all meds as directed   Vitamin D deficiency Assessment & Plan: New problem Reviewed labs from last visit Discussed complications from vitamin D deficiency  Last vitamin D Lab Results  Component Value Date   VD25OH 21.1 (L) 09/21/2022   Begin prescription vitamin D 50,000 IU once weekly.  Discontinue over-the-counter vitamin D.  Recheck level in 3 to 4 months   Orders: -     Vitamin D (Ergocalciferol); Take 1 capsule (50,000 Units total) by mouth every 7 (seven) days.  Dispense: 5 capsule; Refill: 0  Major depressive disorder in partial remission, unspecified whether recurrent (HCC)  BMI 45.0-49.9, adult [facility])      He was informed of the importance of frequent follow up visits to maximize his success with intensive lifestyle modifications for his multiple health conditions.   ATTESTASTION STATEMENTS:  Reviewed by clinician on day of visit: allergies, medications, problem list, medical history, surgical history, family history, social history, and previous encounter notes pertinent to obesity diagnosis.   I have personally spent 30 minutes total time today in preparation, patient  care, nutritional counseling and documentation for this visit, including the following: review of clinical lab tests; review of medical tests/procedures/services.      Dell Ponto, DO DABFM, DABOM [facility] [address] [place], [place] [address] [phone]

## 2022-10-18 NOTE — Progress Notes (Signed)
FOLLOW UP  Date of Service/Encounter:  10/18/22   Assessment:   Moderate persistent asthma, uncomplicated   Absolute eosinophil count of 200 (August 2023)   Perennial and seasonal allergic rhinitis (grass, weeds, ragweed, trees, dust mites, cat, and dog)   Prolonged hospitalization for COVID19 requiring ECMO in late 2020   Fully vaccinated against COVID19  Plan/Recommendations:   1. Moderate persistent asthma, uncomplicated - Lung testing was stable compared to last time.  - We are going to start Tezspire to help with asthma control. - This is the newest one, but blocks pathways higher than the other biologics. - Tammy will reach out to discuss this with you.  - Daily controller medication(s):  Breztri two puffs twice daily with spacer - Prior to physical activity: albuterol 2 puffs 10-15 minutes before physical activity. - Rescue medications: albuterol 4 puffs every 4-6 hours as needed - Asthma control goals:  * Full participation in all desired activities (may need albuterol before activity) * Albuterol use two time or less a week on average (not counting use with activity) * Cough interfering with sleep two time or less a month * Oral steroids no more than once a year * No hospitalizations  2. Allergic rhinitis - well controlled (grass, weeds, ragweed, trees, dust mites, cat, and dog) - Continue with Nasacort one spray per nostril daily. - Restart the Atrovent nasal spray one spray per nostril every 8 hours as needed for runny nose.  - This Atrovent can make you over dry as well.  - Continue with cetirizine '10mg'$  daily.  3. Return in about 3 months (around 01/18/2023).    Subjective:   [name] is a 47 y.o. male presenting today for follow up of  Chief Complaint  Patient presents with   Follow-up    Pt still having issues with his asthma whezzing .    [name] has a history of the following: Patient Active Problem List   Diagnosis Date Noted    Type 2 diabetes mellitus without complication, without long-term current use of insulin ([place]) 10/18/2022   Vitamin D deficiency 10/18/2022   Other fatigue 09/07/2022   SOBOE (shortness of breath on exertion) 09/07/2022   Nocturnal hypoxemia 09/07/2022   Mood disorder ([place]) 09/07/2022   Ulcer of left foot ([place]) 09/07/2022   Depression 08/23/2022   Prediabetes 08/23/2022   Obstructive sleep apnea 01/11/2022   Insomnia due to other mental disorder 12/06/2021   Left foot drop 09/06/2021   Wheelchair dependence 06/04/2021   Encounter for therapeutic drug monitoring 09/29/2020   COVID-19 long hauler manifesting chronic dyspnea 09/15/2020   Morbid obesity ([place]) 09/15/2020   Encounter for attention to tracheostomy ([place]) 09/15/2020   Critical illness polyneuropathy ([place]) 09/15/2020   Moderate protein-calorie malnutrition (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   Impaired sensation to light touch 04/15/2020   Diffuse alveolar damage ([place]) 03/09/2020   Eschar of foot    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   History of COVID-19    Pressure injury of skin 08/22/2019   Need for emotional support    Chest tube in place    Personal history of ECMO    Advanced care planning/counseling discussion    Palliative care by specialist    Goals of  care, counseling/discussion    Advanced directives, counseling/discussion    Subcutaneous crepitus    Thrombocytopenia (HCC)    Leukopenia    Fever    Gram positive bacterial infection    Septic arthritis of right acromioclavicular joint ([place]) 09/29/2017   Chronic left-sided low back pain with left-sided sciatica 09/19/2017   Epigastric pain    Primary hypertension 04/17/2017    Moderate persistent asthma 07/21/2015   Allergic rhinoconjunctivitis 07/21/2015   GERD (gastroesophageal reflux disease) 07/21/2015   Abnormal gait 11/12/2009   TARSAL TUNNEL SYNDROME, LEFT 10/08/2009   PES PLANUS 10/08/2009    History obtained from: chart review and patient.  [name] is a 47 y.o. male presenting for a follow up visit.  He was last seen in January 2024.  At that time, his lung testing was a little bit worse.  We thought this might be related to ongoing sinus infection.  We did talk about adding an injectable medication to his regimen.  We continue with Breztri 2 puffs twice daily as well as albuterol as needed.  For his rhinitis, we continue with Nasacort as well as Atrovent and cetirizine.  Since last visit, he has done well.  Asthma/Respiratory Symptom History: He has been so so since the last visit. He remains on the Breztri two puffs BID. He does not always take the nighttime dose. He did go to Urgent Care relatively recently - around 3 weeks ago. He did get some steroids and amoxicillin . He also got prednisone a few a couple of weeks before that episode and he also got antibiotics.   He is on 2 L O2 at night.  This is all managed by pulmonology.  He is wondering if this contributes to his worsening lung function.  Allergic Rhinitis Symptom History: He is currently on cetirizine daily.  He has Nasacort but is not using that routinely. It seems that when he eats or drinks anything, he has rhinorrhea constantly. He reports that he has a runny nose constantly.   Otherwise, there have been no changes to his past medical history, surgical history, family history, or social history.    Review of Systems  Constitutional: Negative.  Negative for chills, fever, malaise/fatigue and weight loss.  HENT: Negative.  Negative for congestion, ear discharge, ear pain and sinus pain.   Eyes:  Negative for pain, discharge and redness.  Respiratory:  Negative for cough, sputum production,  shortness of breath, wheezing and stridor.   Cardiovascular: Negative.  Negative for chest pain and palpitations.  Gastrointestinal:  Negative for abdominal pain, constipation, diarrhea, heartburn, nausea and vomiting.  Skin: Negative.  Negative for itching and rash.  Neurological:  Negative for dizziness and headaches.  Endo/Heme/Allergies:  Positive for environmental allergies. Does not bruise/bleed easily.       Objective:   Blood pressure 138/88, pulse 76, temperature 98.2 F (36.8 C), resp. rate 20, height '5\' 9"'$  (1.753 m), weight (!) 314 lb (142.4 kg), SpO2 90 %. Body mass index is 46.37 kg/m.    Physical Exam Vitals reviewed.  Constitutional:      Appearance: He is well-developed.     Comments: Not in a wheelchair today.  Comfortable appearing.  HENT:     Head: Normocephalic and atraumatic.     Right Ear: Tympanic membrane, ear canal and external ear normal.     Left Ear: External ear normal. There is impacted cerumen.     Nose: No nasal deformity, septal deviation, mucosal edema or rhinorrhea.  Right Turbinates: Enlarged, swollen and pale.     Left Turbinates: Enlarged, swollen and pale.     Right Sinus: No maxillary sinus tenderness or frontal sinus tenderness.     Left Sinus: No maxillary sinus tenderness or frontal sinus tenderness.     Comments: No nasal polyps.    Mouth/Throat:     Lips: Pink.     Mouth: Mucous membranes are moist. Mucous membranes are not pale and not dry.     Pharynx: Uvula midline.  Eyes:     General: Lids are normal. Allergic shiner present.        Right eye: No discharge.        Left eye: No discharge.     Conjunctiva/sclera: Conjunctivae normal.     Right eye: Right conjunctiva is not injected. No chemosis.    Left eye: Left conjunctiva is not injected. No chemosis.    Pupils: Pupils are equal, round, and reactive to light.  Cardiovascular:     Rate and Rhythm: Normal rate and regular rhythm.     Heart sounds: Normal heart sounds.   Pulmonary:     Effort: Pulmonary effort is normal. No tachypnea, accessory muscle usage or respiratory distress.     Breath sounds: Normal breath sounds. No wheezing, rhonchi or rales.     Comments: Moving air well in the upper fields, but not in the lower fields. Chest:     Chest wall: No tenderness.  Lymphadenopathy:     Cervical: No cervical adenopathy.  Skin:    General: Skin is warm.     Capillary Refill: Capillary refill takes less than 2 seconds.     Coloration: Skin is not pale.     Findings: Wound present. No abrasion, erythema, petechiae or rash. Rash is not papular, urticarial or vesicular.     Comments: No eczematous or urticarial lesions noted. He does have dressing in place on his left foot secondary to pressure ulcers which are healing, albeit slowly.  He is not able to wear any shoes at this point on the left side.  Neurological:     Mental Status: He is alert.  Psychiatric:        Behavior: Behavior is cooperative.      Diagnostic studies:    Spirometry: results abnormal (FEV1: 2.30/62%, FVC: 2.88/62%, FEV1/FVC: 80%).    Spirometry consistent with possible restrictive disease.  Overall, this is stable compared to previous lung testing.    Allergy Studies: sent blood work instead        Salvatore Marvel, MD  [facility] [place] [facility] [place]

## 2022-10-20 ENCOUNTER — Encounter (HOSPITAL_BASED_OUTPATIENT_CLINIC_OR_DEPARTMENT_OTHER): Payer: PPO | Admitting: Internal Medicine

## 2022-10-20 DIAGNOSIS — L97522 Non-pressure chronic ulcer of other part of left foot with fat layer exposed: Secondary | ICD-10-CM

## 2022-10-21 NOTE — Progress Notes (Signed)
BEN, DEVANY ([id number]) [phone].pdf Page 1 of 7 Visit Report for 10/20/2022 Arrival Information Details Patient Name: Date of Service: [name], [name] 10/20/2022 10:30 A M Medical Record Number: [id number] Patient Account Number: [id number] Date of Birth/Sex: Treating RN: Oct 03, 1975 (47 y.o. [name] Primary Care [name]: [name] Other Clinician: Referring [name]: Treating [name]/Extender: [name] in Treatment: 31 Visit Information History Since Last Visit Added or deleted any medications: No Patient Arrived: Wheel Chair Any new allergies or adverse reactions: No Arrival Time: 10:33 Had a fall or experienced change in No Accompanied By: spouse activities of daily living that may affect Transfer Assistance: Manual risk of falls: Patient Identification Verified: Yes Signs or symptoms of abuse/neglect since last visito No Patient Requires Transmission-Based Precautions: No Hospitalized since last visit: No Patient Has Alerts: No Implantable device outside of the clinic excluding No cellular tissue based products placed in the center since last visit: Has Dressing in Place as Prescribed: Yes Pain Present Now: Yes Electronic Signature(s) Signed: 10/20/2022 5:14:42 PM By: [name] Catholic RN Entered By: [name] on 10/20/2022 10:34:58 -------------------------------------------------------------------------------- Encounter Discharge Information Details Patient Name: Date of Service: [name], [place] E. 10/20/2022 10:30 A M Medical Record Number: [id number] Patient Account Number: [id number] Date of Birth/Sex: Treating RN: May 26, 1976 (47 y.o. [name] Primary Care Sue Mcalexander: [name] Other Clinician: Referring Brailon Don: Treating [name]/Extender: [name] in Treatment: 118 Encounter Discharge Information Items Post Procedure Vitals Discharge Condition:  Stable Temperature (F): 98.6 Ambulatory Status: Wheelchair Pulse (bpm): 90 Discharge Destination: Home Respiratory Rate (breaths/min): 18 Transportation: Private Auto Blood Pressure (mmHg): 174/83 Accompanied By: spouse Schedule Follow-up Appointment: Yes Clinical Summary of Care: Patient Declined Electronic Signature(s) Signed: 10/20/2022 5:14:42 PM By: [name] Catholic RN Entered By: [name] on 10/20/2022 17:14:10 -------------------------------------------------------------------------------- Lower Extremity Assessment Details Patient Name: Date of Service: [name], [name] 10/20/2022 10:30 A M Medical Record Number: [id number] Patient Account Number: [id number] Date of Birth/Sex: Treating RN: [birth year] (47 y.o. [name] Primary Care Dorismar Chay: [name] Other Clinician: Referring Kayn Haymore: Treating [name]/Extender: [name] in Treatment: 118 Edema Assessment Assessed: [name]: No] [name]: No] G[LeftBETHANY, [name] [id number] [[id number].pdf Page 2 of 7] Edema: [Left: N] [Right: o] Calf Left: Right: Point of Measurement: 29 cm From Medial Instep 44 cm Ankle Left: Right: Point of Measurement: 9 cm From Medial Instep 24 cm Vascular Assessment Pulses: Dorsalis Pedis Palpable: [Left:Yes] Electronic Signature(s) Signed: 10/20/2022 5:14:42 PM By: [name] Catholic RN Entered By: [name] on 10/20/2022 10:38:08 -------------------------------------------------------------------------------- Multi Wound Chart Details Patient Name: Date of Service: [name], [name] 10/20/2022 10:30 A M Medical Record Number: [id number] Patient Account Number: [id number] Date of Birth/Sex: Treating RN: 1975-08-31 (47 y.o. M) Primary Care [name]: [name] Other Clinician: Referring [name]: Treating [name]/Extender: [name] in Treatment: 118 Vital Signs Height(in):  69 Pulse(bpm): 90 Weight(lbs): 280 Blood Pressure(mmHg): 174/83 Body Mass Index(BMI): 41.3 Temperature(F): 98.6 Respiratory Rate(breaths/min): 18 [2:Photos:] [N/A:N/A] Left Calcaneus N/A N/A Wound Location: Pressure Injury N/A N/A Wounding Event: Pressure Ulcer N/A N/A Primary Etiology: Asthma, Angina, Hypertension N/A N/A Comorbid History: 10/07/2019 N/A N/A Date Acquired: 118 N/A N/A Weeks of Treatment: Open N/A N/A Wound Status: No N/A N/A Wound Recurrence: 0.3x0.1x0.1 N/A N/A Measurements L x W x D (cm) 0.024 N/A N/A A (cm) : rea 0.002 N/A N/A Volume (cm) : 99.90% N/A N/A % Reduction in A rea: 100.00% N/A  N/A % Reduction in Volume: Category/Stage III N/A N/A Classification: Small N/A N/A Exudate A mount: Serosanguineous N/A N/A Exudate Type: red, brown N/A N/A Exudate Color: Thickened N/A N/A Wound Margin: Small (1-33%) N/A N/A Granulation A mount: Red, Pink N/A N/A Granulation Quality: Large (67-100%) N/A N/A Necrotic A mount: Eschar, Adherent Slough N/A N/A Necrotic Tissue: Fat Layer (Subcutaneous Tissue): Yes N/A N/A Exposed Structures: Fascia: No Tendon: No [name], [name] ([id number]) [phone].pdf Page 3 of 7 Muscle: No Joint: No Bone: No Large (67-100%) N/A N/A Epithelialization: Debridement - Selective/Open Wound N/A N/A Debridement: Lidocaine 4% Topical Solution N/A N/A Pain Control: Other, Slough N/A N/A Tissue Debrided: Non-Viable Tissue N/A N/A Level: 0.03 N/A N/A Debridement A (sq cm): rea Curette N/A N/A Instrument: Minimum N/A N/A Bleeding: Pressure N/A N/A Hemostasis A chieved: 0 N/A N/A Procedural Pain: 0 N/A N/A Post Procedural Pain: Debridement Treatment Response: Procedure was tolerated well N/A N/A Post Debridement Measurements L x 0.3x0.1x0.1 N/A N/A W x D (cm) 0.002 N/A N/A Post Debridement Volume: (cm) Category/Stage III N/A N/A Post Debridement Stage: Callus: Yes N/A  N/A Periwound Skin Texture: Scarring: Yes Maceration: Yes N/A N/A Periwound Skin Moisture: No Abnormalities Noted N/A N/A Periwound Skin Color: No Abnormality N/A N/A Temperature: Debridement N/A N/A Procedures Performed: Treatment Notes Electronic Signature(s) Signed: 10/20/2022 3:34:03 PM By: [name] Shan DO Entered By: [name] on 10/20/2022 10:52:22 -------------------------------------------------------------------------------- Multi-Disciplinary Care Plan Details Patient Name: Date of Service: [name], [name] 10/20/2022 10:30 A M Medical Record Number: [id number] Patient Account Number: [id number] Date of Birth/Sex: Treating RN: September 16, 1975 (47 y.o. [name] Primary Care Irlene Crudup: [name] Other Clinician: Referring Denario Bagot: Treating Felina Tello/Extender: [name] in Treatment: [place] reviewed with physician Active Inactive Wound/Skin Impairment Nursing Diagnoses: Impaired tissue integrity Knowledge deficit related to ulceration/compromised skin integrity Goals: Patient/caregiver will verbalize understanding of skin care regimen Date Initiated: 07/15/2020 Target Resolution Date: 03/08/2023 Goal Status: Active Ulcer/skin breakdown will have a volume reduction of 30% by week 4 Date Initiated: 07/15/2020 Date Inactivated: 08/20/2020 Target Resolution Date: 09/03/2020 Goal Status: Unmet Unmet Reason: no major changes. Ulcer/skin breakdown will heal within 14 weeks Date Initiated: 12/04/2020 Date Inactivated: 12/10/2020 Target Resolution Date: 12/10/2020 Unmet Reason: wounds still open at 14 Goal Status: Unmet weeks and today 21 weeks. Interventions: Assess patient/caregiver ability to obtain necessary supplies Assess patient/caregiver ability to perform ulcer/skin care regimen upon admission and as needed Assess ulceration(s) every visit Provide education on ulcer and skin care Treatment  Activities: Skin care regimen initiated : 07/15/2020 Topical wound management initiated : 07/15/2020 [name], [name] ([id number]) [phone].pdf Page 4 of 7 Notes: Electronic Signature(s) Signed: 10/20/2022 5:14:42 PM By: [name] Catholic RN Entered By: [name] on 10/20/2022 17:10:00 -------------------------------------------------------------------------------- Pain Assessment Details Patient Name: Date of Service: [name], [place] 10/20/2022 10:30 A M Medical Record Number: [id number] Patient Account Number: [id number] Date of Birth/Sex: Treating RN: 06-Apr-1976 (47 y.o. [name] Primary Care Ahana Najera: [name] Other Clinician: Referring Delawrence Fridman: Treating Dheeraj Hail/Extender: [name] in Treatment: 118 Active Problems Location of Pain Severity and Description of Pain Patient Has Paino Yes Site Locations Pain Location: Generalized Pain With Dressing Change: No Duration of the Pain. Constant / Intermittento Constant Rate the pain. Current Pain Level: 2 Worst Pain Level: 10 Least Pain Level: 2 Tolerable Pain Level: 2 Character of Pain Describe the Pain: Difficult to Pinpoint Pain Management and Medication Current Pain Management: Medication: Yes Cold  Application: No Rest: Yes Massage: No Activity: No T.E.N.S.: No Heat Application: No Leg drop or elevation: No Is the Current Pain Management Adequate: Adequate How does your wound impact your activities of daily livingo Sleep: No Bathing: No Appetite: No Relationship With Others: No Bladder Continence: No Emotions: No Bowel Continence: No Work: No Toileting: No Drive: No Dressing: No Hobbies: No Electronic Signature(s) Signed: 10/20/2022 5:14:42 PM By: [name] Catholic RN Entered By: [name] on 10/20/2022 10:37:39 -------------------------------------------------------------------------------- Patient/Caregiver Education Details Patient  Name: Date of Service: Ciro Backer 3/14/2024andnbsp10:30 Roe Record Number: [id number] Patient Account Number: [id number] Date of Birth/Gender: Treating RN: 1976-03-09 (47 y.o. [place], [place], [place] ([id number]) [phone].pdf Page 5 of 7 Primary Care Physician: [name] Other Clinician: Referring Physician: Treating Physician/Extender: [name] in Treatment: 118 Education Assessment Education Provided To: Patient Education Topics Provided Wound/Skin Impairment: Methods: Explain/Verbal Responses: Return demonstration correctly Electronic Signature(s) Signed: 10/20/2022 5:14:42 PM By: [name] Catholic RN Entered By: [name] on 10/20/2022 17:10:19 -------------------------------------------------------------------------------- Wound Assessment Details Patient Name: Date of Service: [name], [place] 10/20/2022 10:30 A M Medical Record Number: [id number] Patient Account Number: [id number] Date of Birth/Sex: Treating RN: 09/17/1975 (47 y.o. [name] Primary Care Tyden Kann: [name] Other Clinician: Referring Maretta Overdorf: Treating Mahmud Keithly/Extender: [name] in Treatment: 118 Wound Status Wound Number: 2 Primary Etiology: Pressure Ulcer Wound Location: Left Calcaneus Wound Status: Open Wounding Event: Pressure Injury Comorbid History: Asthma, Angina, Hypertension Date Acquired: 10/07/2019 Weeks Of Treatment: 118 Clustered Wound: No Photos Wound Measurements Length: (cm) 0.3 Width: (cm) 0.1 Depth: (cm) 0.1 Area: (cm) 0.024 Volume: (cm) 0.002 % Reduction in Area: 99.9% % Reduction in Volume: 100% Epithelialization: Large (67-100%) Tunneling: No Undermining: No Wound Description Classification: Category/Stage III Wound Margin: Thickened Exudate Amount: Small Exudate Type: Serosanguineous Exudate Color: red, brown Foul Odor After Cleansing:  No Slough/Fibrino No Wound Bed Granulation Amount: Small (1-33%) Exposed Structure Granulation Quality: Red, Pink Fascia Exposed: No Necrotic Amount: Large (67-100%) Fat Layer (Subcutaneous Tissue) ExposedBALDWIN, KRISTIANSEN ([id number]) [phone].pdf Page 6 of 7 Necrotic Quality: Eschar, Adherent Slough Tendon Exposed: No Muscle Exposed: No Joint Exposed: No Bone Exposed: No Periwound Skin Texture Texture Color No Abnormalities Noted: No No Abnormalities Noted: Yes Callus: Yes Temperature / Pain Scarring: Yes Temperature: No Abnormality Moisture No Abnormalities Noted: Yes Treatment Notes Wound #2 (Calcaneus) Wound Laterality: Left Cleanser Normal Saline Discharge Instruction: Cleanse the wound with Normal Saline prior to applying a clean dressing using gauze sponges, not tissue or cotton balls. Wound Cleanser Discharge Instruction: Cleanse the wound with wound cleanser prior to applying a clean dressing using gauze sponges, not tissue or cotton balls. Peri-Wound Care Topical compounding topical antibiotics from [facility] Discharge Instruction: apply directly to wound bed. Primary Dressing Endoform 2x2 in Discharge Instruction: Moisten with saline or Hydrogel Secondary Dressing Optifoam Non-Adhesive Dressing, 4x4 in Discharge Instruction: Apply over primary dressing as directed. Woven Gauze Sponge, Non-Sterile 4x4 in Discharge Instruction: Apply over primary dressing as directed. Secured With 46M Medipore H Soft Cloth Surgical T ape, 4 x 10 (in/yd) Discharge Instruction: Secure with tape as directed. Compression Wrap Kerlix Roll 4.5x3.1 (in/yd) Discharge Instruction: Apply Kerlix and Coban compression as directed. Compression Stockings Add-Ons Electronic Signature(s) Signed: 10/20/2022 4:01:22 PM By: Adline Peals Signed: 10/20/2022 5:14:42 PM By: [name] Catholic RN Entered By: Adline Peals on 10/20/2022  10:39:41 -------------------------------------------------------------------------------- Vitals Details Patient Name: Date of Service: [name], [place]  E. 10/20/2022 10:30 A M Medical Record Number: [id number] Patient Account Number: [id number] Date of Birth/Sex: Treating RN: 07/03/1976 (47 y.o. [name] Primary Care Zaara Sprowl: [name] Other Clinician: Referring Zair Borawski: Treating Yanis Juma/Extender: [name] in Treatment: 118 Vital Signs Time Taken: 10:35 Temperature (F): 98.6 Height (in): 69 Pulse (bpm): 90 Weight (lbs): 280 Respiratory Rate (breaths/min): 18 Body Mass Index (BMI): 41.3 Blood Pressure (mmHg): 174/83 VIRGE, SOHL ([id number]) [id number] Page 7 of 7 Reference Range: 80 - 120 mg / dl Electronic Signature(s) Signed: 10/20/2022 5:14:42 PM By: [name] Catholic RN Entered By: [name] on 10/20/2022 10:36:58

## 2022-10-21 NOTE — Progress Notes (Signed)
CHALMUS, VANDIJK ([id number]) [id number] Page 1 of 15 Visit Report for 10/20/2022 Chief Complaint Document Details Patient Name: Date of Service: [name], [place] 10/20/2022 10:30 A M Medical Record Number: [id number] Patient Account Number: [id number] Date of Birth/Sex: Treating RN: 1975/10/11 (47 y.o. M) Primary Care Provider: [name] Other Clinician: Referring Provider: Treating Provider/Extender: [name] in Treatment: [place] from: Patient Chief Complaint Bilateral Plantar Foot Ulcers Electronic Signature(s) Signed: 10/20/2022 3:34:03 PM By: Kalman Shan DO Entered By: Kalman Shan on 10/20/2022 10:52:29 -------------------------------------------------------------------------------- Debridement Details Patient Name: Date of Service: [name], [place] 10/20/2022 10:30 A M Medical Record Number: [id number] Patient Account Number: [id number] Date of Birth/Sex: Treating RN: [birth year] (47 y.o. [name] Primary Care Provider: [name] Other Clinician: Referring Provider: Treating Provider/Extender: [name] in Treatment: 118 Debridement Performed for Assessment: Wound #2 Left Calcaneus Performed By: Physician Kalman Shan, DO Debridement Type: Debridement Level of Consciousness (Pre-procedure): Awake and Alert Start Time: 10:47 Pain Control: Lidocaine 4% T opical Solution T Area Debrided (L x W): otal 0.3 (cm) x 0.1 (cm) = 0.03 (cm) Tissue and other material debrided: Non-Viable, [place], Other: skin Level: Non-Viable Tissue Debridement Description: Selective/Open Wound Instrument: Curette Bleeding: Minimum Hemostasis Achieved: Pressure End Time: 10:48 Procedural Pain: 0 Post Procedural Pain: 0 Response to Treatment: Procedure was tolerated well Level of Consciousness (Post- Awake and Alert procedure): Post Debridement Measurements of  Total Wound Length: (cm) 0.3 Stage: Category/Stage III Width: (cm) 0.1 Depth: (cm) 0.1 Volume: (cm) 0.002 Character of Wound/Ulcer Post Debridement: Improved Post Procedure Diagnosis Same as Pre-procedure Notes Scribed for Dr. Heber Hemlock by J.Scotton Electronic Signature(s) Signed: 10/20/2022 3:34:03 PM By: Kalman Shan DO Signed: 10/20/2022 5:14:42 PM By: [name] Entered By: [name] on 10/20/2022 10:51:39 [name] ([id number]) [id number] Page 2 of 15 -------------------------------------------------------------------------------- HPI Details Patient Name: Date of Service: [name], [place] 10/20/2022 10:30 A M Medical Record Number: [id number] Patient Account Number: [id number] Date of Birth/Sex: Treating RN: 1975-12-04 (47 y.o. M) Primary Care Provider: [name] Other Clinician: Referring Provider: Treating Provider/Extender: [name] in Treatment: 118 History of Present Illness HPI Description: Wounds are12/03/2020 upon evaluation today patient presents for initial inspection here in our clinic concerning issues he has been having with the bottoms of his feet bilaterally. He states these actually occurred as wounds when he was hospitalized for 5 months secondary to Covid. He was apparently with tilting bed where he was in an upright position quite frequently and apparently this occurred in some way shape or form during that time. Fortunately there is no sign of active infection at this time. No fevers, chills, nausea, vomiting, or diarrhea. With that being said he still has substantial wounds on the plantar aspects of his feet Theragen require quite a bit of work to get these to heal. He has been using Santyl currently though that is been problematic both in receiving the medication as well as actually paid for it as it is become quite expensive. Prior to the experience with Covid the patient really  did not have any major medical problems other than hypertension he does have some mild generalized weakness following the Covid experience. 07/22/2020 on evaluation today patient appears to be doing okay in regard to his foot ulcers I feel like the wound beds are showing signs of better improvement that I do believe the Iodoflex is helping in this regard. With that  being said he does have a lot of drainage currently and this is somewhat blue/green in nature which is consistent with Pseudomonas. I do think a culture today would be appropriate for [place] to evaluate and see if that is indeed the case I would likely start him on antibiotic orally as well he is not allergic to Cipro knows of no issues he has had in the past 12/21; patient was admitted to the clinic earlier this month with bilateral presumed pressure ulcers on the bottom of his feet apparently related to excessive pressure from a tilt table arrangement in the intensive care unit. Patient relates this to being on ECMO but I am not really sure that is exactly related to that. I must say I have never seen anything like this. He has fairly extensive full-thickness wounds extending from his heel towards his midfoot mostly centered laterally. There is already been some healing distally. He does not appear to have an arterial issue. He has been using gentamicin to the wound surfaces with Iodoflex to help with ongoing debridement 1/6; this is a patient with pressure ulcers on the bottom of his feet related to excessive pressure from a standing position in the intensive care unit. He is complaining of a lot of pain in the right heel. He is not a diabetic. 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 We have been using Iodoflex to help prepare the surfaces of both wounds for an advanced treatment product. He is nonambulatory spending most of his time in a wheelchair I have asked him not to propel the wheelchair with his heels 1/13;  in general his wounds look better not much surface area change we have been using Iodoflex as of last week. I did an x-ray of the right heel as the patient was complaining of pain. I had some thoughts about a stress fracture perhaps Achilles tendon problems however what it showed was erosive changes along the inferior aspect of the calcaneus he now has a MRI booked for 1/20. 1/20; in general his wounds continue to be better. Some improvement in the large narrow areas proximally in his foot. He is still complaining of pain in the right heel and tenderness in certain areas of this wound. His MRI is tonight. I am not just looking for osteomyelitis that was brought up on the x-ray I am wondering about stress fractures, tendon ruptures etc. He has no such findings on the left. Also noteworthy is that the patient had critical illness neuropathy and some of the discomfort may be actual improvement in nerve function I am just not sure. These wounds were initially in the setting of severe critical illness related to COVID-19. He was put in a standing position. He may have also been on pressors at the point contributing to tissue ischemia. By his description at some point these wounds were grossly necrotic extending proximally up into the Achilles part of his heel. I do not know that I have ever really seen pictures of them like this although they may exist in epic We have ordered Tri layer Oasis. I am trying to stimulate some granulation in these areas. This is of course assuming the MRI is negative for infection 1/27; since the patient was last here he saw Dr. Juleen China of infectious disease. He is planned for vancomycin and ceftriaxone. Prior operative culture grew MSSA. Also ordered baseline lab work. He also ordered arterial studies although the ABIs in our clinic were normal as well as his  clinical exam these were normal I do not think he needs to see vascular surgery. His ABIs at the [facility] were 1.22 in the  right triphasic waveforms with a normal TBI of 1.15 on the left ABI of 1.22 with triphasic waveforms and a normal TBI of 1.08. Finally he saw Dr. Amalia Hailey who will follow him in for 2 months. At this point I do not think he felt that he needed a procedure on the right calcaneal bone. Dr. Juleen China is elected for broad-spectrum antibiotic The patient is still having pain in the right heel. He walks with a walker 2/3; wounds are generally smaller. He is tolerating his IV antibiotics. I believe this is vancomycin and ceftriaxone. We are still waiting for Oasis burn in terms of his out-of-pocket max which he should be meeting soon given the IV antibiotics, MRIs etc. I have asked him to check in on this. We are using silver collagen in the meantime the wounds look better 2/10; tolerating IV vancomycin and Rocephin. We are waiting to apply for Oasis. Although I am not really sure where he is in his out-of-pocket max. 2/17 started the first application of Oasis trilayer. Still on antibiotics. The wounds have generally look better. The area on the left has a little more surface slough requiring debridement [id number]; second application of Oasis trilayer. The wound surface granulation is generally look better. The area on the left with undermining laterally I think is come in a bit. 10/08/2020 upon evaluation today patient is here today for [employer] application #3. Fortunately he seems to be doing extremely well with regard to this and we are seeing a lot of new epithelial growth which is great news. Fortunately there is no signs of active infection at this time. 10/16/2020 upon evaluation today patient appears to be doing well with regard to his foot ulcers. Do believe the Oasis has been of benefit for him. I do not see any signs of infection right now which is great news and I think that he has a lot of new epithelial growth which is great to see as well. The patient is very pleased to hear all of this. I do  think we can proceed with the Oasis trilayer #4 today. 3/18; not as much improvement in these areas on his heels that I was hoping. I did reapply trilateral Oasis today the tissue looks healthier but not as much fill in as I was hoping. 3/25; better looking today I think this is come in a bit the tissue looks healthier. Triple layer Oasis reapplied #6 4/1; somewhat better looking definitely better looking surface not as much change in surface area as I was hoping. He may be spending more time [place] on days then he needs to although he does have heel offloading boots. Triple layer Oasis reapplied #7 4/7; unfortunately apparently [facility] will not approve any further Oasis which is unfortunate since the patient did respond nicely both in terms of the condition of the wound bed as well as surface area. There is still some drainage coming from the wound but not a lot there does not appear to be any infection 4/15; we have been using Hydrofera Blue. He continues to have nice rims of epithelialization on the right greater than the left. The left the epithelialization is coming from the tip of his heel. There is moderate drainage. In this that concerns me about a total contact cast. There is no evidence of infection DILYNN, ROMMEL ([id number])  [phone].pdf Page 3 of 15 4/29; patient has been using Hydrofera Blue with dressing changes. He has no complaints or issues today. 5/5; using Hydrofera Blue. I actually think that he looks marginally better than the last time I saw this 3 weeks ago. There are rims of epithelialization on the left thumb coming from the medial side on the right. Using Hydrofera Blue 5/12; using Hydrofera Blue. These continue to make improvements in surface area. His drainage was not listed as severe I therefore went ahead and put a cast on the left foot. Right foot we will continue to dress his previous 5/16; back for first total contact cast  change. He did not tolerate this particularly well cast injury on the anterior tibia among other issues. Difficulty sleeping. I talked him about this in some detail and afterwards is elected to continue. I told him I would like to have a cast on for 3 weeks to see if this is going to help at all. I think he agreed 5/19; I think the wound is better. There is no tunneling towards his midfoot. The undermining medially also looks better. He has a rim of new skin distally. I think we are making progress here. The area on the left also continues to look somewhat better to me using Hydrofera Blue. He has a list of complaints about the cast but none of them seem serious 5/26; patient presents for 1 week follow-up. He has been using a total contact cast and tolerating this well. Hydrofera Blue is the main dressing used. He denies signs of infection. 6/2 Hydrofera Blue total contact cast on the left. These were large ulcers that formed in intensive care unit where the patient was recovering from [place]. May have had something to do with being ventilated in an upright positiono Pressors etc. We have been able to get the areas down considerably and a viable surface. There is some epithelialization in both sides. Note made of drainage 6/9; changed to [facility] last time because of drainage. He arrives with better looking surfaces and dimensions on the left than the right. Paradoxically the right actually probes more towards his midfoot the left is largely close down but both of these look improved. Using a total contact cast on the left 6/16; complex wounds on his bilateral plantar heels which were initially pressure injury from a stay in the ICU with COVID. We have been using silver alginate most recently. His dimensions of come in quite dramatically however not recently. We have been putting the left foot in a total contact cast 6/23; complex wounds on the bilateral plantar heels. I been putting the left in  the cast paradoxically the area on the right is the one that is going towards closure at a faster rate. Quite a bit of drainage on the left. The patient went to see Dr. Amalia Hailey who said he was going to standby for skin grafts. I had actually considered sending him for skin grafts however he would be mandatorily off his feet for a period of weeks to months. I am thinking that the area on the right is going to close on its own the area on the left has been more stubborn even though we have him in a total contact cast 6/30; took him out of a total contact cast last week is the right heel seem to be making better progress than the left where I was placing the cast. We are using silver alginate. Both wounds are smaller right greater  than left 7/12; both wounds look as though they are making some progress. We are using silver alginate. Heel offloading boots 7/26; very gradual progress especially on the right. Using silver alginate. He is wearing heel offloading boots 8/18; he continues to close these wounds down very gradually. Using silver alginate. The problem polymen being definitive about this is areas of what appears to be callus around the margins. This is not a 100% of the area but certainly sizable especially on the right 9/1; bilateral plantar feet wounds secondary to prolonged pressure while being ventilated for COVID-19 in an upright position. Essentially pressure ulcers on the bottom of his feet. He is made substantial progress using silver alginate. 9/14; bilateral plantar feet wounds secondary to prolonged pressure. Making progress using silver alginate. 9/29 bilateral plantar feet wounds secondary to prolonged pressure. I changed him to Iodoflex last week. MolecuLight showing reddened blush fluorescence 10/11; patient presents for follow-up. He has no issues or complaints today. He denies signs of infection. He continues to use Iodoflex and antibiotic ointment to the wound beds. 10/27; 2-week  follow-up. No evidence of infection. He has callus and thick dry skin around the wound margins we have been using Iodoflex and Bactroban which was in response to a moderate left MolecuLight reddish blush fluorescence. 11/10; 2-week follow-up. Wound margins again have thick callus however the measurements of the actual wound sites are a lot smaller. Everything looks reasonably healthy here. We have been using Iodoflex He was approved for prime matrix but I have elected to delay this given the improvement in the surface area. Hopefully I will not regret that decision as were getting close to the end of the year in terms of insurance payment 12/8; 2-week follow-up. Wounds are generally smaller in size. These were initially substantial wounds extending into the forefoot all the way into the heel on the bilateral plantar feet. They are now both located on the plantar heel distal aspect both of these have a lot of callus around the wounds I used a #5 curette to remove this on the right and the left also some subcutaneous debris to try and get the wound edges were using Iodoflex. He has heel offloading shoe 12/22; 2-week follow-up. Not really much improvement. He has thick callus around the outer edges of both wounds. I remove this there is some nonviable subcutaneous tissue as well. We have been using Iodoflex. Her intake nurse and myself spontaneously thought of a total contact cast I went back in May. At that time we really were not seeing much of an improvement with a cast although the wound was in a much different situation I would like to retry this in 2 weeks and I discussed this with the patient 08/12/2021; the patient has had some improvement with the Iodoflex. The the area on the left heel plantar more improved than the right. I had to put him in a total contact cast on the left although I decided to put that off for 2 weeks. I am going to change his primary dressing to silver collagen. I think in  both areas he has had some improvement most of the healing seems to be more proximal in the heel. The wounds are in the mid aspect. A lot of thick callus on the right heel however. 1/19; we are using silver collagen on both plantar heel areas. He has had some improvement today. The left did not require any debridement. He still had some eschar on the right that was  debrided but both seem to have contracted. I did not put it total contact cast on him today 2/2 we have been using silver collagen. The area on the right plantar heel has areas that appear to be epithelialized interspersed with dry flaking callus and dry skin. I removed this. This really looks better than on the other side. On the left still a large area with raised edges and debris on the surface. 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 He has no issues or complaints today. 2/9; not much change in the left foot wound with 1 week of a cast we are using silver collagen. Silver collagen on the right side. The right side has been the better wound surface. We will reapply the total contact cast on the left 2/16; not much improvement on either side I been using silver collagen with a total contact cast on the left. I'm changing the Hydrofera Blue still with a total contact cast on the left 2/23; some improvement on both sides. Disappointing that he has thick callus around the area that we are putting in a total contact cast on the left. We've been using Hydrofera Blue on both wound areas. This is a man who at essentially pressure ulcers in addition to ischemia caused by medications to support his blood pressure (pressors) in the ICU. He was being ventilated in the standing position for severe Covid. A [place] the wounds extended across his entire foot but are now localized to his plantar heels bilaterally. We  have made progress however neither areas healed. I continue to think the total contact cast is helped albeit painstakingly slowly. He has never wanted a plastic surgery consult although I don't know that they would be interested in grafting in area in this location. 10/07/2021: Continued improvement bilaterally. There is still some callus around the left wound, despite the total contact cast. He has some increased pain in his right midfoot around 1 particular area. This has been painful in the past but seems to be a little bit worse. When his cast was removed today, there was an area on the heel of the left foot that looks a bit macerated. He is also complaining of pain in his left thigh and hip which he thinks is secondary to the limb length discrepancy caused by the cast. 10/14/2021: He continues to improve. A little bit less callus around the left wound. He continues to endorse pain in his right midfoot, but this is not as significant as it was last week. The maceration on his left heel is improved. 10/21/2021: Continued improvement to both wounds. The maceration on his left heel is no longer evident. Less callus bilaterally. Epithelialization progressing. 10/28/2021: Significant improvement this week. The right sided wound is nearly closed with just a small open area at the middle. No maceration seen on the left heel. Continued epithelialization on both sides. No concern for infection. LARENZO, CITTADINO ([id number]) [id number] Page 4 of 15 11/04/2021: T oday, the wounds were measured a little bit differently and come out as larger, but I actually think they are about the same to potentially even smaller, particularly on the left. He continues to accumulate some callus on the right. 11/11/2021: T oday, the patient is expressing some concern that the left wound, despite being in the total contact cast, is not progressing at the same rate as  the 1 on the right. He is interested in  trying a week without the cast to see how the wound does. The wounds are roughly the same size as last week, with the right perhaps being a little bit smaller. He continues to build up callus on both sites. 11/18/2021: Last week, I permitted the patient to go without his total contact cast, just to see if the cast was really making any difference. Today, both wounds have deteriorated to some extent, suggesting that the cast is providing benefit, at least on the left. Both are larger and have accumulated callus, slough, and other debris. 11/26/2021: I debrided both wounds quite aggressively last week in an effort to stimulate the healing cascade. This appears to have been effective as the left sided wound is a full centimeter shorter in length. Although the right was measured slightly larger, on inspection, it looks as though an area of epithelialized tissue was included in the measurements. We have been using PolyMem Ag on the wound surfaces with a total contact cast to the left. 12/02/2021: It appears that the intake personnel are including epithelialized tissue in his wound measurements; the right wound is almost completely epithelialized; there is just a crater at the proximal midfoot with some open areas. On the left, he has built up some callus, but the overall wound surface looks good. There is some senescent skin around the wound margin. He has been in PolyMem Ag bilaterally with a total contact cast on the left. 12/09/2021: The right wound is nearly closed; there is just a small open area at the mid calcaneus. On the left, the wound is smaller with minimal callus buildup. No significant drainage. 12/16/2021: The right calcaneal wound remains minimally open at the mid calcaneus; the rest has epithelialized. On the left, the wound is also a little bit smaller. There is some senescent tissue on the periphery. He is getting his first application of a trial skin substitute called Vendaje  today. 12/23/2021: The wound on his right calcaneus is nearly closed; there is just a small area at the most distal aspect of the calcaneus that is open. On the left, the area where we applied to the skin substitute has a healthier-looking bed of granulation tissue. The wound dimensions are not significantly different on this side but the wound surface is improved. 12/30/2021: The wound on the right calcaneus has not changed significantly aside from some accumulation of callus. On the left, the open area is smaller and continues to have an improved surface. He continues to accumulate callus around the wound. He is here for his third application of Vendaje. 01/06/2022: The right calcaneal wound is down to just a couple of millimeters. He continues to accumulate periwound callus. He unfortunately got his cast wet earlier in the week and his left foot is macerated, resulting in some superficial skin loss just distal to the open wound. The open wound itself, however, is much smaller and has a healthier appearing surface. He is here for his fourth application of Vendaje. 01/13/2022: The right calcaneal wound is about the same. Unfortunately, once again, his cast got wet and his foot is again macerated. This is caused the left calcaneal wound to enlarge. He is here for his fifth application of Vendaje. 01/20/2022: The right calcaneal wound is very small. There is some periwound callus accumulation. He purchased a new cast protector last week and this has been effective in avoiding the maceration that has been occurring on the left. The left  calcaneal wound is narrower and has a healthy and viable-appearing surface. He is here for his 6 application of Vendaje. 01/27/2022: The right calcaneal wound is down to just a pinhole. There is some periwound slough and callus. On the left, the wound is narrower and shorter by about a centimeter. The surface is robust and viable-appearing. Unfortunately, the rep for the trial  skin substitute product did not provide any for [place] to use today. 02/04/2022: The right calcaneal wound remains unchanged. There is more accumulated callus. On the left, although the intake nurse measured it a little bit longer, it looks about the same to me. The surface has a layer of slough, but underneath this, there is good granulation tissue. 02/10/2022: The right calcaneus wound is nearly closed. There is still some callus that builds up around the site. The left side looks about the same in terms of dimensions, but the surface is more robust and vital-appearing. 02/16/2022: The area of the right calcaneus that was nearly closed last week has closed, but there is a small opening at the mid foot where it looks like some moisture got retained and caused some reopening. The left foot wound is narrower and shallower. Both sites have a fair amount of periwound callus and eschar. 02/24/2022: The small midfoot opening on the right calcaneus is a little bit smaller today. The left foot wound is narrower and shallower. He continues to accumulate periwound callus. No concern for infection. 03/01/2022: The patient came to clinic early because he showered and got his cast wet. Fortunately, there is no significant maceration to his foot but the callus softened and it looks like the wound on his left calcaneus may be a little bit wider. The wound on his right calcaneus is just a narrow slit. Continued accumulation of periwound callus bilaterally. 03/08/2022: The wound on his right calcaneus is very nearly closed, just a small pinpoint opening under a bit of eschar; the left wound has come in quite a bit, as well. It is narrower and shorter than at our last visit. Still with accumulated callus and eschar bilaterally. 03/17/2022: The right calcaneal wound is healed. The left wound is smaller and the surface itself is very clean, but there is some blue-green staining on the periwound callus, concerning for Pseudomonas  aeruginosa. 03/23/2022: The right calcaneal wound remains closed. The left wound continues to contract. No further blue-green staining. Small amount of callus and slough accumulation. 03/28/2022: He came in early today because he had gotten his cast wet. On inspection, the wound itself did not get wet or macerated, just a little bit of the forefoot. The wound itself is basically unchanged. 04/07/2022: The right foot wound remains closed. The left wound is the smallest that I have seen it to date. It is narrower and shorter. It still continues to accumulate slough on the surface. 04/15/2022: There is a band of epithelium now dividing the small left plantar foot wound in 2. There is still some slough on the surface. 04/21/2022: The wound continues to narrow. Just a little bit of slough on the surface. He seems to be responding well to endoform. 04/28/2022: Continued slow contraction of the wound. There is a little slough on the surface and some periwound callus. We have been using endoform and total contact cast. 05/05/2022: The wound appears to have stalled. There is slough and some periwound eschar/callus. No concern for infection, however. 05/12/2022: Unfortunately, his right foot has reopened. It is located at the most posterior aspect of  his surgical incision. The area was noted to have drainage coming from it when his padding was removed today. Underneath some callus and senescent skin, there is an opening. No purulent drainage or malodor. On the left foot, the wound is again unchanged. There is some light blue staining on the callus, but no malodor or purulent drainage. RAIDER, SISCO ([id number]) [id number] Page 5 of 15 10/13; right and left heel remanence of extensive plantar foot wounds. These are better than I remember by quite a big margin however he is still left with wounds on the left plantar heel and the right plantar heel. Been using endoform bilaterally. A culture  was done that showed apparently Pseudomonas but we are still waiting for the [facility] antibiotic to use gentamicin today. He is still very active by description I am not sure about the offloading of his noncasted right foot 10/20; both wounds right and left heel debrided not much change from last week. [employer] has arrived which is linezolid, gentamicin and ciprofloxacin we will use this with endoform. T contact cast on the left otal 06/02/2022: Both wounds are smaller today. There is still a fair amount of callus buildup around the right foot ulcer. The left is more superficial and nearly flush with the surrounding tissues. Also with slough and eschar buildup. 06/10/2022: The right sided wound appears to be nearly closed, if not completely so, although it is somewhat difficult to tell given the abnormal tissue and scarring in his foot. There is a fair amount of callus and crust accumulation. On the left, the wound looks about the same, again with callus and slough. He has an appointment next Thursday with Dr. Loel Lofty in podiatry; I am hopeful that there may be some reconstructive options available for Mr. Scharff. 06/16/2022: Both wounds have some eschar and callus accumulation. The right sided wound is extremely narrow and barely open; the left is narrower than last week. There is a little bit of slough. He has his appointment in podiatry later today. 06/23/2022: The patient met with Dr. Loel Lofty last week and unfortunately, there are no reconstructive options that he believes would be helpful. He did order an MRI to evaluate for osteomyelitis and fortunately, none was seen. The left sided wound is a little bit shorter and narrower today. The right sided wound is about the same. There is callus and eschar accumulation bilaterally. 06/29/2022: Both feet have improved from last week. There is epithelium making a valiant effort to creep across the surface on the left. The right side looks like it  got a little dry and the deep crevasse in his midfoot has cracked. Both have eschar and there is some slough on the left. 07/07/2022: Both feet have improved. There is epithelium completely covering the calcaneus on the right with just a small opening in the crevasse in his midfoot. On the left, the open area of tissue is smaller but he continues to build up callus/eschar and slough. 07/15/2022: The opening in the midfoot on the right is about the same size, covered with eschar and a little bit of slough. The open portion of the left wound is narrower and shorter with a bit of slough buildup. He admits to being on his feet more than recommended. 12/14; as far as I can tell everything on the right foot is closed. There is some eschar I removed some of this I cannot identify any open wound here. As usual this will be a very vulnerable area going forward. On the  left this looks really quite healthy. I was pleasantly surprised to see how good this looked. Wound is certainly smaller and there appears to be healthy epithelialization. He has been using Promogran on the right and endoform on the left. He has been offloading the right foot with a heel offloading boot and he has a running shoe on the right foot 08/04/2022: The right foot remains closed. He has a thick cushioned insole in his sneaker. The left sided wound is smaller with just some slough and eschar accumulation. He is wearing the heel off loader on this foot. 08/15/2022: The right foot remains closed. The left wound has narrowed further. There is some slough and eschar accumulation. 08/25/2022: We put him in a peg assist shoe insert and as a result, he has more epithelialization of the ulcer with minimal slough and eschar accumulation. 09/01/2022: The wound is smaller by about half this week. Still with some slough on the surface. The peg assist shoe seems to be doing a remarkable job of adequately offloading the site. 09/08/2022: There is a little  bit more epithelium coming in. There is some slough and callus buildup. 09/16/2022: The wound measures about the same size, but the epithelium that has grown in looks more robust and stronger. There is some slough and callus buildup. 09/23/2022: The wound remains about the same size. The skin edges are looking rather senescent. 09/29/2022: The aggressive debridement I performed last week seems to have been effective. The wound is smaller and has significantly less slough accumulation. 10/06/2022: There has been quite a bit of epithelialization since last week. There are still some open areas with slough accumulation. There is some callus buildup around the perimeter. 10/13/2022: Continued improvement. Minimal callus accumulation. 3/14; patient presents for follow-up. He has been using endoform to the wound bed without issues. He is using a surgical shoe with peg assist for offloading. Electronic Signature(s) Signed: 10/20/2022 3:34:03 PM By: Kalman Shan DO Entered By: Kalman Shan on 10/20/2022 10:52:56 -------------------------------------------------------------------------------- Physical Exam Details Patient Name: Date of Service: [name], [place] 10/20/2022 10:30 A M Medical Record Number: [id number] Patient Account Number: [id number] Date of Birth/Sex: Treating RN: 08/20/1975 (47 y.o. M) Primary Care Provider: [name] Other Clinician: Referring Provider: Treating Provider/Extender: [name] in Treatment: 118 Constitutional respirations regular, non-labored and within target range for patient.. Cardiovascular 2+ dorsalis pedis/posterior tibialis pulses. TILAK, BRIETZKE ([id number]) [id number] Page 6 of 15 Psychiatric pleasant and cooperative. Notes Open wound with slough, granulation tissue and minimal callus however mostly epithelization. No signs of surrounding infection. Electronic Signature(s) Signed: 10/20/2022  3:34:03 PM By: Kalman Shan DO Entered By: Kalman Shan on 10/20/2022 10:53:48 -------------------------------------------------------------------------------- Physician Orders Details Patient Name: Date of Service: [name], [place] 10/20/2022 10:30 A M Medical Record Number: [id number] Patient Account Number: [id number] Date of Birth/Sex: Treating RN: April 13, 1976 (47 y.o. [name] Primary Care Provider: [name] Other Clinician: Referring Provider: Treating Provider/Extender: [name] in Treatment: [phone] Verbal / Phone Orders: No Diagnosis Coding Follow-up Appointments ppointment in 1 week. - Dr. [name] (Dr. [place] covering) Return A ppointment in 2 weeks. - Dr. [name] 10/27/2022 1015 Room 3 Return A Anesthetic Wound #2 Left Calcaneus (In clinic) Topical Lidocaine 4% applied to wound bed - In clinic Bathing/ Shower/ Hygiene May shower and wash wound with soap and water. Edema Control - Lymphedema / SCD / Other Bilateral Lower Extremities Avoid standing for long periods of time. Moisturize legs daily. -  as needed Off-Loading Other: - keep pressure off of the bottom of your feet. Elevate legs throughout the day. Use the Shoe with the PegAssist off-loading insole Additional Orders / Instructions Follow Nutritious Diet - Try to get 70-100 grams of Protein a day+ Wound Treatment Wound #2 - Calcaneus Wound Laterality: Left Cleanser: Normal Saline (Generic) Every Other Day/30 Days Discharge Instructions: Cleanse the wound with Normal Saline prior to applying a clean dressing using gauze sponges, not tissue or cotton balls. Cleanser: Wound Cleanser (Generic) Every Other Day/30 Days Discharge Instructions: Cleanse the wound with wound cleanser prior to applying a clean dressing using gauze sponges, not tissue or cotton balls. Topical: compounding topical antibiotics from [place] Every Other Day/30 Days Discharge Instructions:  apply directly to wound bed. Prim Dressing: Endoform 2x2 in (Generic) Every Other Day/30 Days ary Discharge Instructions: Moisten with saline or Hydrogel Secondary Dressing: Optifoam Non-Adhesive Dressing, 4x4 in (Generic) Every Other Day/30 Days Discharge Instructions: Apply over primary dressing as directed. Secondary Dressing: Woven Gauze Sponge, Non-Sterile 4x4 in (Generic) Every Other Day/30 Days Discharge Instructions: Apply over primary dressing as directed. Secured With: 56M Medipore H Soft Cloth Surgical T ape, 4 x 10 (in/yd) (Generic) Every Other Day/30 Days Discharge Instructions: Secure with tape as directed. Compression Wrap: Kerlix Roll 4.5x3.1 (in/yd) Every Other Day/30 Days Discharge Instructions: Apply Kerlix and Coban compression as directed. MATTI, KEPP ([id number]) [id number] Page 7 of 15 Electronic Signature(s) Signed: 10/20/2022 3:34:03 PM By: Kalman Shan DO Signed: 10/20/2022 5:14:42 PM By: [name] Entered By: [name] on 10/20/2022 11:00:25 -------------------------------------------------------------------------------- Problem List Details Patient Name: Date of Service: [name], [place] 10/20/2022 10:30 A M Medical Record Number: [id number] Patient Account Number: [id number] Date of Birth/Sex: Treating RN: Nov 29, 1975 (47 y.o. M) Primary Care Provider: [name] Other Clinician: Referring Provider: Treating Provider/Extender: [name] in Treatment: 118 Active Problems ICD-10 Encounter Code Description Active Date MDM Diagnosis L97.522 Non-pressure chronic ulcer of other part of left foot with fat layer exposed 09/03/2020 No Yes Inactive Problems ICD-10 Code Description Active Date Inactive Date L97.512 Non-pressure chronic ulcer of other part of right foot with fat layer exposed 09/03/2020 09/03/2020 L89.893 Pressure ulcer of other site, stage 3 07/15/2020 07/15/2020 M62.81 Muscle  weakness (generalized) 07/15/2020 07/15/2020 I10 Essential (primary) hypertension 07/15/2020 07/15/2020 M86.171 Other acute osteomyelitis, right ankle and foot 09/03/2020 09/03/2020 Resolved Problems Electronic Signature(s) Signed: 10/20/2022 3:34:03 PM By: Kalman Shan DO Entered By: Kalman Shan on 10/20/2022 10:52:17 -------------------------------------------------------------------------------- Progress Note Details Patient Name: Date of Service: [name], [place] 10/20/2022 10:30 A M Medical Record Number: [id number] Patient Account Number: [id number] Date of Birth/Sex: Treating RN: 1976-05-13 (47 y.o. M) Primary Care Provider: [name] Other Clinician: Referring Provider: Treating Provider/Extender: [name] in Treatment: 118 Subjective Chief Complaint Information obtained from Patient Bilateral Plantar Foot Ulcers THELTON, MOFFITT ([id number]) [phone].pdf Page 8 of 15 History of Present Illness (HPI) Wounds are12/03/2020 upon evaluation today patient presents for initial inspection here in our clinic concerning issues he has been having with the bottoms of his feet bilaterally. He states these actually occurred as wounds when he was hospitalized for 5 months secondary to Covid. He was apparently with tilting bed where he was in an upright position quite frequently and apparently this occurred in some way shape or form during that time. Fortunately there is no sign of active infection at this time. No fevers, chills, nausea, vomiting, or diarrhea. With that being  said he still has substantial wounds on the plantar aspects of his feet Theragen require quite a bit of work to get these to heal. He has been using Santyl currently though that is been problematic both in receiving the medication as well as actually paid for it as it is become quite expensive. Prior to the experience with Covid the patient really did not have any  major medical problems other than hypertension he does have some mild generalized weakness following the Covid experience. 07/22/2020 on evaluation today patient appears to be doing okay in regard to his foot ulcers I feel like the wound beds are showing signs of better improvement that I do believe the Iodoflex is helping in this regard. With that being said he does have a lot of drainage currently and this is somewhat blue/green in nature which is consistent with Pseudomonas. I do think a culture today would be appropriate for [place] to evaluate and see if that is indeed the case I would likely start him on antibiotic orally as well he is not allergic to Cipro knows of no issues he has had in the past 12/21; patient was admitted to the clinic earlier this month with bilateral presumed pressure ulcers on the bottom of his feet apparently related to excessive pressure from a tilt table arrangement in the intensive care unit. Patient relates this to being on ECMO but I am not really sure that is exactly related to that. I must say I have never seen anything like this. He has fairly extensive full-thickness wounds extending from his heel towards his midfoot mostly centered laterally. There is already been some healing distally. He does not appear to have an arterial issue. He has been using gentamicin to the wound surfaces with Iodoflex to help with ongoing debridement 1/6; this is a patient with pressure ulcers on the bottom of his feet related to excessive pressure from a standing position in the intensive care unit. He is complaining of a lot of pain in the right heel. He is not a diabetic. 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 We have been using Iodoflex to help prepare the surfaces of both wounds for an advanced treatment product. He is nonambulatory spending most of his time in a wheelchair I have asked him not to propel the wheelchair with his heels 1/13; in general his  wounds look better not much surface area change we have been using Iodoflex as of last week. I did an x-ray of the right heel as the patient was complaining of pain. I had some thoughts about a stress fracture perhaps Achilles tendon problems however what it showed was erosive changes along the inferior aspect of the calcaneus he now has a MRI booked for 1/20. 1/20; in general his wounds continue to be better. Some improvement in the large narrow areas proximally in his foot. He is still complaining of pain in the right heel and tenderness in certain areas of this wound. His MRI is tonight. I am not just looking for osteomyelitis that was brought up on the x-ray I am wondering about stress fractures, tendon ruptures etc. He has no such findings on the left. Also noteworthy is that the patient had critical illness neuropathy and some of the discomfort may be actual improvement in nerve function I am just not sure. These wounds were initially in the setting of severe critical illness related to COVID-19. He was put in a standing position. He may have also been  on pressors at the point contributing to tissue ischemia. By his description at some point these wounds were grossly necrotic extending proximally up into the Achilles part of his heel. I do not know that I have ever really seen pictures of them like this although they may exist in epic We have ordered Tri layer Oasis. I am trying to stimulate some granulation in these areas. This is of course assuming the MRI is negative for infection 1/27; since the patient was last here he saw Dr. Juleen China of infectious disease. He is planned for vancomycin and ceftriaxone. Prior operative culture grew MSSA. Also ordered baseline lab work. He also ordered arterial studies although the ABIs in our clinic were normal as well as his clinical exam these were normal I do not think he needs to see vascular surgery. His ABIs at the PTA were 1.22 in the right triphasic  waveforms with a normal TBI of 1.15 on the left ABI of 1.22 with triphasic waveforms and a normal TBI of 1.08. Finally he saw Dr. Amalia Hailey who will follow him in for 2 months. At this point I do not think he felt that he needed a procedure on the right calcaneal bone. Dr. Juleen China is elected for broad-spectrum antibiotic The patient is still having pain in the right heel. He walks with a walker 2/3; wounds are generally smaller. He is tolerating his IV antibiotics. I believe this is vancomycin and ceftriaxone. We are still waiting for Oasis burn in terms of his out-of-pocket max which he should be meeting soon given the IV antibiotics, MRIs etc. I have asked him to check in on this. We are using silver collagen in the meantime the wounds look better 2/10; tolerating IV vancomycin and Rocephin. We are waiting to apply for Oasis. Although I am not really sure where he is in his out-of-pocket max. 2/17 started the first application of Oasis trilayer. Still on antibiotics. The wounds have generally look better. The area on the left has a little more surface slough requiring debridement [id number]; second application of Oasis trilayer. The wound surface granulation is generally look better. The area on the left with undermining laterally I think is come in a bit. 10/08/2020 upon evaluation today patient is here today for [employer] application #3. Fortunately he seems to be doing extremely well with regard to this and we are seeing a lot of new epithelial growth which is great news. Fortunately there is no signs of active infection at this time. 10/16/2020 upon evaluation today patient appears to be doing well with regard to his foot ulcers. Do believe the Oasis has been of benefit for him. I do not see any signs of infection right now which is great news and I think that he has a lot of new epithelial growth which is great to see as well. The patient is very pleased to hear all of this. I do think we can proceed  with the Oasis trilayer #4 today. 3/18; not as much improvement in these areas on his heels that I was hoping. I did reapply trilateral Oasis today the tissue looks healthier but not as much fill in as I was hoping. 3/25; better looking today I think this is come in a bit the tissue looks healthier. Triple layer Oasis reapplied #6 4/1; somewhat better looking definitely better looking surface not as much change in surface area as I was hoping. He may be spending more time [place] on days then he needs to although he  does have heel offloading boots. Triple layer Oasis reapplied #7 4/7; unfortunately apparently [facility] will not approve any further Oasis which is unfortunate since the patient did respond nicely both in terms of the condition of the wound bed as well as surface area. There is still some drainage coming from the wound but not a lot there does not appear to be any infection 4/15; we have been using Hydrofera Blue. He continues to have nice rims of epithelialization on the right greater than the left. The left the epithelialization is coming from the tip of his heel. There is moderate drainage. In this that concerns me about a total contact cast. There is no evidence of infection 4/29; patient has been using Hydrofera Blue with dressing changes. He has no complaints or issues today. 5/5; using Hydrofera Blue. I actually think that he looks marginally better than the last time I saw this 3 weeks ago. There are rims of epithelialization on the left thumb coming from the medial side on the right. Using Hydrofera Blue 5/12; using Hydrofera Blue. These continue to make improvements in surface area. His drainage was not listed as severe I therefore went ahead and put a cast on the left foot. Right foot we will continue to dress his previous 5/16; back for first total contact cast change. He did not tolerate this particularly well cast injury on the anterior tibia among other issues.  Difficulty sleeping. I talked him about this in some detail and afterwards is elected to continue. I told him I would like to have a cast on for 3 weeks to see if this is going to help at all. I think he agreed 5/19; I think the wound is better. There is no tunneling towards his midfoot. The undermining medially also looks better. He has a rim of new skin distally. I think we are making progress here. The area on the left also continues to look somewhat better to me using Hydrofera Blue. He has a list of complaints about the cast but none of them seem serious 5/26; patient presents for 1 week follow-up. He has been using a total contact cast and tolerating this well. Hydrofera Blue is the main dressing used. He denies signs of infection. 6/2 Hydrofera Blue total contact cast on the left. These were large ulcers that formed in intensive care unit where the patient was recovering from [facility]. May have had something to do with being ventilated in an upright positiono Pressors etc. We have been able to get the areas down considerably and a viable surface. There is some epithelialization in both sides. Note made of drainage DYMOND, EYE ([id number]) [id number] Page 9 of 15 6/9; changed to [facility] last time because of drainage. He arrives with better looking surfaces and dimensions on the left than the right. Paradoxically the right actually probes more towards his midfoot the left is largely close down but both of these look improved. Using a total contact cast on the left 6/16; complex wounds on his bilateral plantar heels which were initially pressure injury from a stay in the ICU with COVID. We have been using silver alginate most recently. His dimensions of come in quite dramatically however not recently. We have been putting the left foot in a total contact cast 6/23; complex wounds on the bilateral plantar heels. I been putting the left in the cast paradoxically  the area on the right is the one that is going towards closure at a faster  rate. Quite a bit of drainage on the left. The patient went to see Dr. Amalia Hailey who said he was going to standby for skin grafts. I had actually considered sending him for skin grafts however he would be mandatorily off his feet for a period of weeks to months. I am thinking that the area on the right is going to close on its own the area on the left has been more stubborn even though we have him in a total contact cast 6/30; took him out of a total contact cast last week is the right heel seem to be making better progress than the left where I was placing the cast. We are using silver alginate. Both wounds are smaller right greater than left 7/12; both wounds look as though they are making some progress. We are using silver alginate. Heel offloading boots 7/26; very gradual progress especially on the right. Using silver alginate. He is wearing heel offloading boots 8/18; he continues to close these wounds down very gradually. Using silver alginate. The problem polymen being definitive about this is areas of what appears to be callus around the margins. This is not a 100% of the area but certainly sizable especially on the right 9/1; bilateral plantar feet wounds secondary to prolonged pressure while being ventilated for COVID-19 in an upright position. Essentially pressure ulcers on the bottom of his feet. He is made substantial progress using silver alginate. 9/14; bilateral plantar feet wounds secondary to prolonged pressure. Making progress using silver alginate. 9/29 bilateral plantar feet wounds secondary to prolonged pressure. I changed him to Iodoflex last week. MolecuLight showing reddened blush fluorescence 10/11; patient presents for follow-up. He has no issues or complaints today. He denies signs of infection. He continues to use Iodoflex and antibiotic ointment to the wound beds. 10/27; 2-week follow-up. No evidence  of infection. He has callus and thick dry skin around the wound margins we have been using Iodoflex and Bactroban which was in response to a moderate left MolecuLight reddish blush fluorescence. 11/10; 2-week follow-up. Wound margins again have thick callus however the measurements of the actual wound sites are a lot smaller. Everything looks reasonably healthy here. We have been using Iodoflex He was approved for prime matrix but I have elected to delay this given the improvement in the surface area. Hopefully I will not regret that decision as were getting close to the end of the year in terms of insurance payment 12/8; 2-week follow-up. Wounds are generally smaller in size. These were initially substantial wounds extending into the forefoot all the way into the heel on the bilateral plantar feet. They are now both located on the plantar heel distal aspect both of these have a lot of callus around the wounds I used a #5 curette to remove this on the right and the left also some subcutaneous debris to try and get the wound edges were using Iodoflex. He has heel offloading shoe 12/22; 2-week follow-up. Not really much improvement. He has thick callus around the outer edges of both wounds. I remove this there is some nonviable subcutaneous tissue as well. We have been using Iodoflex. Her intake nurse and myself spontaneously thought of a total contact cast I went back in May. At that time we really were not seeing much of an improvement with a cast although the wound was in a much different situation I would like to retry this in 2 weeks and I discussed this with the patient 08/12/2021; the patient has had  some improvement with the Iodoflex. The the area on the left heel plantar more improved than the right. I had to put him in a total contact cast on the left although I decided to put that off for 2 weeks. I am going to change his primary dressing to silver collagen. I think in both areas he has had  some improvement most of the healing seems to be more proximal in the heel. The wounds are in the mid aspect. A lot of thick callus on the right heel however. 1/19; we are using silver collagen on both plantar heel areas. He has had some improvement today. The left did not require any debridement. He still had some eschar on the right that was debrided but both seem to have contracted. I did not put it total contact cast on him today 2/2 we have been using silver collagen. The area on the right plantar heel has areas that appear to be epithelialized interspersed with dry flaking callus and dry skin. I removed this. This really looks better than on the other side. On the left still a large area with raised edges and debris on the surface. 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 He has no issues or complaints today. 2/9; not much change in the left foot wound with 1 week of a cast we are using silver collagen. Silver collagen on the right side. The right side has been the better wound surface. We will reapply the total contact cast on the left 2/16; not much improvement on either side I been using silver collagen with a total contact cast on the left. I'm changing the Hydrofera Blue still with a total contact cast on the left 2/23; some improvement on both sides. Disappointing that he has thick callus around the area that we are putting in a total contact cast on the left. We've been using Hydrofera Blue on both wound areas. This is a man who at essentially pressure ulcers in addition to ischemia caused by medications to support his blood pressure (pressors) in the ICU. He was being ventilated in the standing position for severe Covid. A Shiley the wounds extended across his entire foot but are now localized to his plantar heels bilaterally. We have made progress  however neither areas healed. I continue to think the total contact cast is helped albeit painstakingly slowly. He has never wanted a plastic surgery consult although I don't know that they would be interested in grafting in area in this location. 10/07/2021: Continued improvement bilaterally. There is still some callus around the left wound, despite the total contact cast. He has some increased pain in his right midfoot around 1 particular area. This has been painful in the past but seems to be a little bit worse. When his cast was removed today, there was an area on the heel of the left foot that looks a bit macerated. He is also complaining of pain in his left thigh and hip which he thinks is secondary to the limb length discrepancy caused by the cast. 10/14/2021: He continues to improve. A little bit less callus around the left wound. He continues to endorse pain in his right midfoot, but this is not as significant as it was last week. The maceration on his left heel is improved. 10/21/2021: Continued improvement to both wounds. The maceration on his left  heel is no longer evident. Less callus bilaterally. Epithelialization progressing. 10/28/2021: Significant improvement this week. The right sided wound is nearly closed with just a small open area at the middle. No maceration seen on the left heel. Continued epithelialization on both sides. No concern for infection. 11/04/2021: T oday, the wounds were measured a little bit differently and come out as larger, but I actually think they are about the same to potentially even smaller, particularly on the left. He continues to accumulate some callus on the right. 11/11/2021: T oday, the patient is expressing some concern that the left wound, despite being in the total contact cast, is not progressing at the same rate as the 1 on the right. He is interested in trying a week without the cast to see how the wound does. The wounds are roughly the same size as last  week, with the right perhaps being a little bit smaller. He continues to build up callus on both sites. 11/18/2021: Last week, I permitted the patient to go without his total contact cast, just to see if the cast was really making any difference. Today, both wounds have deteriorated to some extent, suggesting that the cast is providing benefit, at least on the left. Both are larger and have accumulated callus, slough, and other debris. 11/26/2021: I debrided both wounds quite aggressively last week in an effort to stimulate the healing cascade. This appears to have been effective as the left sided wound is a full centimeter shorter in length. Although the right was measured slightly larger, on inspection, it looks as though an area of epithelialized tissue was included in the measurements. We have been using PolyMem Ag on the wound surfaces with a total contact cast to the left. DESTIN, JONASSON ([id number]) [id number] Page 10 of 15 12/02/2021: It appears that the intake personnel are including epithelialized tissue in his wound measurements; the right wound is almost completely epithelialized; there is just a crater at the proximal midfoot with some open areas. On the left, he has built up some callus, but the overall wound surface looks good. There is some senescent skin around the wound margin. He has been in PolyMem Ag bilaterally with a total contact cast on the left. 12/09/2021: The right wound is nearly closed; there is just a small open area at the mid calcaneus. On the left, the wound is smaller with minimal callus buildup. No significant drainage. 12/16/2021: The right calcaneal wound remains minimally open at the mid calcaneus; the rest has epithelialized. On the left, the wound is also a little bit smaller. There is some senescent tissue on the periphery. He is getting his first application of a trial skin substitute called Vendaje today. 12/23/2021: The wound on his  right calcaneus is nearly closed; there is just a small area at the most distal aspect of the calcaneus that is open. On the left, the area where we applied to the skin substitute has a healthier-looking bed of granulation tissue. The wound dimensions are not significantly different on this side but the wound surface is improved. 12/30/2021: The wound on the right calcaneus has not changed significantly aside from some accumulation of callus. On the left, the open area is smaller and continues to have an improved surface. He continues to accumulate callus around the wound. He is here for his third application of Vendaje. 01/06/2022: The right calcaneal wound is down to just a couple of millimeters. He continues to accumulate periwound callus. He unfortunately got his cast  wet earlier in the week and his left foot is macerated, resulting in some superficial skin loss just distal to the open wound. The open wound itself, however, is much smaller and has a healthier appearing surface. He is here for his fourth application of Vendaje. 01/13/2022: The right calcaneal wound is about the same. Unfortunately, once again, his cast got wet and his foot is again macerated. This is caused the left calcaneal wound to enlarge. He is here for his fifth application of Vendaje. 01/20/2022: The right calcaneal wound is very small. There is some periwound callus accumulation. He purchased a new cast protector last week and this has been effective in avoiding the maceration that has been occurring on the left. The left calcaneal wound is narrower and has a healthy and viable-appearing surface. He is here for his 6 application of Vendaje. 01/27/2022: The right calcaneal wound is down to just a pinhole. There is some periwound slough and callus. On the left, the wound is narrower and shorter by about a centimeter. The surface is robust and viable-appearing. Unfortunately, the rep for the trial skin substitute product did not  provide any for [place] to use today. 02/04/2022: The right calcaneal wound remains unchanged. There is more accumulated callus. On the left, although the intake nurse measured it a little bit longer, it looks about the same to me. The surface has a layer of slough, but underneath this, there is good granulation tissue. 02/10/2022: The right calcaneus wound is nearly closed. There is still some callus that builds up around the site. The left side looks about the same in terms of dimensions, but the surface is more robust and vital-appearing. 02/16/2022: The area of the right calcaneus that was nearly closed last week has closed, but there is a small opening at the mid foot where it looks like some moisture got retained and caused some reopening. The left foot wound is narrower and shallower. Both sites have a fair amount of periwound callus and eschar. 02/24/2022: The small midfoot opening on the right calcaneus is a little bit smaller today. The left foot wound is narrower and shallower. He continues to accumulate periwound callus. No concern for infection. 03/01/2022: The patient came to clinic early because he showered and got his cast wet. Fortunately, there is no significant maceration to his foot but the callus softened and it looks like the wound on his left calcaneus may be a little bit wider. The wound on his right calcaneus is just a narrow slit. Continued accumulation of periwound callus bilaterally. 03/08/2022: The wound on his right calcaneus is very nearly closed, just a small pinpoint opening under a bit of eschar; the left wound has come in quite a bit, as well. It is narrower and shorter than at our last visit. Still with accumulated callus and eschar bilaterally. 03/17/2022: The right calcaneal wound is healed. The left wound is smaller and the surface itself is very clean, but there is some blue-green staining on the periwound callus, concerning for Pseudomonas aeruginosa. 03/23/2022: The right  calcaneal wound remains closed. The left wound continues to contract. No further blue-green staining. Small amount of callus and slough accumulation. 03/28/2022: He came in early today because he had gotten his cast wet. On inspection, the wound itself did not get wet or macerated, just a little bit of the forefoot. The wound itself is basically unchanged. 04/07/2022: The right foot wound remains closed. The left wound is the smallest that I have seen it to  date. It is narrower and shorter. It still continues to accumulate slough on the surface. 04/15/2022: There is a band of epithelium now dividing the small left plantar foot wound in 2. There is still some slough on the surface. 04/21/2022: The wound continues to narrow. Just a little bit of slough on the surface. He seems to be responding well to endoform. 04/28/2022: Continued slow contraction of the wound. There is a little slough on the surface and some periwound callus. We have been using endoform and total contact cast. 05/05/2022: The wound appears to have stalled. There is slough and some periwound eschar/callus. No concern for infection, however. 05/12/2022: Unfortunately, his right foot has reopened. It is located at the most posterior aspect of his surgical incision. The area was noted to have drainage coming from it when his padding was removed today. Underneath some callus and senescent skin, there is an opening. No purulent drainage or malodor. On the left foot, the wound is again unchanged. There is some light blue staining on the callus, but no malodor or purulent drainage. 10/13; right and left heel remanence of extensive plantar foot wounds. These are better than I remember by quite a big margin however he is still left with wounds on the left plantar heel and the right plantar heel. Been using endoform bilaterally. A culture was done that showed apparently Pseudomonas but we are still waiting for the [facility] antibiotic to use gentamicin  today. He is still very active by description I am not sure about the offloading of his noncasted right foot 10/20; both wounds right and left heel debrided not much change from last week. [employer] has arrived which is linezolid, gentamicin and ciprofloxacin we will use this with endoform. T contact cast on the left otal 06/02/2022: Both wounds are smaller today. There is still a fair amount of callus buildup around the right foot ulcer. The left is more superficial and nearly flush with the surrounding tissues. Also with slough and eschar buildup. 06/10/2022: The right sided wound appears to be nearly closed, if not completely so, although it is somewhat difficult to tell given the abnormal tissue and scarring in his foot. There is a fair amount of callus and crust accumulation. On the left, the wound looks about the same, again with callus and slough. He has an appointment next Thursday with Dr. Loel Lofty in podiatry; I am hopeful that there may be some reconstructive options available for Mr. Vinson. 06/16/2022: Both wounds have some eschar and callus accumulation. The right sided wound is extremely narrow and barely open; the left is narrower than last week. There is a little bit of slough. He has his appointment in podiatry later today. REBEL, WURTZ ([id number]) [id number] Page 11 of 15 06/23/2022: The patient met with Dr. Loel Lofty last week and unfortunately, there are no reconstructive options that he believes would be helpful. He did order an MRI to evaluate for osteomyelitis and fortunately, none was seen. The left sided wound is a little bit shorter and narrower today. The right sided wound is about the same. There is callus and eschar accumulation bilaterally. 06/29/2022: Both feet have improved from last week. There is epithelium making a valiant effort to creep across the surface on the left. The right side looks like it got a little dry and the deep  crevasse in his midfoot has cracked. Both have eschar and there is some slough on the left. 07/07/2022: Both feet have improved. There is epithelium completely covering the calcaneus  on the right with just a small opening in the crevasse in his midfoot. On the left, the open area of tissue is smaller but he continues to build up callus/eschar and slough. 07/15/2022: The opening in the midfoot on the right is about the same size, covered with eschar and a little bit of slough. The open portion of the left wound is narrower and shorter with a bit of slough buildup. He admits to being on his feet more than recommended. 12/14; as far as I can tell everything on the right foot is closed. There is some eschar I removed some of this I cannot identify any open wound here. As usual this will be a very vulnerable area going forward. On the left this looks really quite healthy. I was pleasantly surprised to see how good this looked. Wound is certainly smaller and there appears to be healthy epithelialization. He has been using Promogran on the right and endoform on the left. He has been offloading the right foot with a heel offloading boot and he has a running shoe on the right foot 08/04/2022: The right foot remains closed. He has a thick cushioned insole in his sneaker. The left sided wound is smaller with just some slough and eschar accumulation. He is wearing the heel off loader on this foot. 08/15/2022: The right foot remains closed. The left wound has narrowed further. There is some slough and eschar accumulation. 08/25/2022: We put him in a peg assist shoe insert and as a result, he has more epithelialization of the ulcer with minimal slough and eschar accumulation. 09/01/2022: The wound is smaller by about half this week. Still with some slough on the surface. The peg assist shoe seems to be doing a remarkable job of adequately offloading the site. 09/08/2022: There is a little bit more epithelium coming in.  There is some slough and callus buildup. 09/16/2022: The wound measures about the same size, but the epithelium that has grown in looks more robust and stronger. There is some slough and callus buildup. 09/23/2022: The wound remains about the same size. The skin edges are looking rather senescent. 09/29/2022: The aggressive debridement I performed last week seems to have been effective. The wound is smaller and has significantly less slough accumulation. 10/06/2022: There has been quite a bit of epithelialization since last week. There are still some open areas with slough accumulation. There is some callus buildup around the perimeter. 10/13/2022: Continued improvement. Minimal callus accumulation. 3/14; patient presents for follow-up. He has been using endoform to the wound bed without issues. He is using a surgical shoe with peg assist for offloading. Patient History Information obtained from Patient. Family History Cancer - Maternal Grandparents, Diabetes - Father,Paternal Grandparents, Heart Disease - Maternal Grandparents, Hypertension - Father,Paternal Grandparents, Lung Disease - Siblings, No family history of Hereditary Spherocytosis, Kidney Disease, Seizures, Stroke, Thyroid Problems, Tuberculosis. Social History Never smoker, Marital Status - Married, Alcohol Use - Never, Drug Use - No History, Caffeine Use - Daily - tea, soda. Medical History Eyes Denies history of Cataracts, Glaucoma, Optic Neuritis Ear/Nose/Mouth/Throat Denies history of Chronic sinus problems/congestion, Middle ear problems Hematologic/Lymphatic Denies history of Anemia, Hemophilia, Human Immunodeficiency Virus, Lymphedema, Sickle Cell Disease Respiratory Patient has history of Asthma Denies history of Aspiration, Chronic Obstructive Pulmonary Disease (COPD), Pneumothorax, Sleep Apnea, Tuberculosis Cardiovascular Patient has history of Angina - with COVID, Hypertension Denies history of Arrhythmia, Congestive  Heart Failure, Coronary Artery Disease, Deep Vein Thrombosis, Hypotension, Myocardial Infarction, Peripheral Arterial Disease, Peripheral Venous  Disease, Phlebitis, Vasculitis Gastrointestinal Denies history of Cirrhosis , Colitis, Crohnoos, Hepatitis A, Hepatitis B, Hepatitis C Endocrine Denies history of Type I Diabetes, Type II Diabetes Genitourinary Denies history of End Stage Renal Disease Immunological Denies history of Lupus Erythematosus, Raynaudoos, Scleroderma Integumentary (Skin) Denies history of History of Burn Musculoskeletal Denies history of Gout, Rheumatoid Arthritis, Osteoarthritis, Osteomyelitis Neurologic Denies history of Dementia, Neuropathy, Quadriplegia, Paraplegia, Seizure Disorder Oncologic Denies history of Received Chemotherapy, Received Radiation Psychiatric Denies history of Jeri Lager Anxiety DIEZEL, SCHORR ([id number]) [id number] Page 12 of 15 Hospitalization/Surgery History - COVID PNA 07/22/2019- 11/14/2019. - 03/27/2020 wound debridement/ skin graft. Medical A Surgical History Notes nd Constitutional Symptoms (General Health) COVID PNA 07/22/2019-11/14/2019 VENT ECMO, foot drop left foot , Genitourinary kidney stone Psychiatric anxiety Objective Constitutional respirations regular, non-labored and within target range for patient.. Vitals Time Taken: 10:35 AM, Height: 69 in, Weight: 280 lbs, BMI: 41.3, Temperature: 98.6 F, Pulse: 90 bpm, Respiratory Rate: 18 breaths/min, Blood Pressure: 174/83 mmHg. Cardiovascular 2+ dorsalis pedis/posterior tibialis pulses. Psychiatric pleasant and cooperative. General Notes: Open wound with slough, granulation tissue and minimal callus however mostly epithelization. No signs of surrounding infection. Integumentary (Hair, Skin) Wound #2 status is Open. Original cause of wound was Pressure Injury. The date acquired was: 10/07/2019. The wound has been in  treatment 118 weeks. The wound is located on the Left Calcaneus. The wound measures 0.3cm length x 0.1cm width x 0.1cm depth; 0.024cm^2 area and 0.002cm^3 volume. There is Fat Layer (Subcutaneous Tissue) exposed. There is no tunneling or undermining noted. There is a small amount of serosanguineous drainage noted. The wound margin is thickened. There is small (1-33%) red, pink granulation within the wound bed. There is a large (67-100%) amount of necrotic tissue within the wound bed including Eschar and Adherent Slough. The periwound skin appearance had no abnormalities noted for moisture. The periwound skin appearance had no abnormalities noted for color. The periwound skin appearance exhibited: Callus, Scarring. Periwound temperature was noted as No Abnormality. Assessment Active Problems ICD-10 Non-pressure chronic ulcer of other part of left foot with fat layer exposed Patient's wound appears well-healing. I debrided nonviable tissue. I recommended continue the course with endoform. Continue aggressive offloading with surgical shoe and peg assist. Follow-up in 1 week. Procedures Wound #2 Pre-procedure diagnosis of Wound #2 is a Pressure Ulcer located on the Left Calcaneus . There was a Selective/Open Wound Non-Viable Tissue Debridement with a total area of 0.03 sq cm performed by Kalman Shan, DO. With the following instrument(s): Curette to remove Non-Viable tissue/material. Material removed includes Slough and Other: skin after achieving pain control using Lidocaine 4% T opical Solution. No specimens were taken.A Minimum amount of bleeding was controlled with Pressure. The procedure was tolerated well with a pain level of 0 throughout and a pain level of 0 following the procedure. Post Debridement Measurements: 0.3cm length x 0.1cm width x 0.1cm depth; 0.002cm^3 volume. Post debridement Stage noted as Category/Stage III. Character of Wound/Ulcer Post Debridement is improved. Post  procedure Diagnosis Wound #2: Same as Pre-Procedure General Notes: Scribed for Dr. Heber Netarts by J.Scotton. Plan Follow-up Appointments: Return Appointment in 1 week. - Dr. [name] (Dr. Heber [place] covering) Return Appointment in 2 weeks. - Dr. [name] 10/27/2022 [place] 3 Anesthetic: KRISTOPHER, SISAK ([id number]) [id number] Page 13 of 15 Wound #2 Left Calcaneus: (In clinic) Topical Lidocaine 4% applied to wound bed - In clinic Bathing/ Shower/ Hygiene: May shower and wash wound with soap and water. Edema Control - Lymphedema / SCD /  Other: Avoid standing for long periods of time. Moisturize legs daily. - as needed Off-Loading: Other: - keep pressure off of the bottom of your feet. Elevate legs throughout the day. Use the Shoe with the PegAssist off-loading insole Additional Orders / Instructions: Follow Nutritious Diet - Try to get 70-100 grams of Protein a day+ WOUND #2: - Calcaneus Wound Laterality: Left Cleanser: Normal Saline (Generic) Every Other Day/30 Days Discharge Instructions: Cleanse the wound with Normal Saline prior to applying a clean dressing using gauze sponges, not tissue or cotton balls. Cleanser: Wound Cleanser (Generic) Every Other Day/30 Days Discharge Instructions: Cleanse the wound with wound cleanser prior to applying a clean dressing using gauze sponges, not tissue or cotton balls. Topical: compounding topical antibiotics from [place] Every Other Day/30 Days Discharge Instructions: apply directly to wound bed. Prim Dressing: Endoform 2x2 in (Generic) Every Other Day/30 Days ary Discharge Instructions: Moisten with saline or Hydrogel Secondary Dressing: Optifoam Non-Adhesive Dressing, 4x4 in (Generic) Every Other Day/30 Days Discharge Instructions: Apply over primary dressing as directed. Secondary Dressing: Woven Gauze Sponge, Non-Sterile 4x4 in (Generic) Every Other Day/30 Days Discharge Instructions: Apply over primary dressing  as directed. Secured With: 61M Medipore H Soft Cloth Surgical T ape, 4 x 10 (in/yd) (Generic) Every Other Day/30 Days Discharge Instructions: Secure with tape as directed. Com pression Wrap: Kerlix Roll 4.5x3.1 (in/yd) Every Other Day/30 Days Discharge Instructions: Apply Kerlix and Coban compression as directed. 1. In office sharp debridement 2. Endoform 3. Aggressive offloadingoosurgical shoe with peg assist 4. Follow-up in 1 week Electronic Signature(s) Unsigned Previous Signature: 10/20/2022 3:34:03 PM Version By: Kalman Shan DO Entered By: [name] on 10/21/2022 08:14:06 -------------------------------------------------------------------------------- HxROS Details Patient Name: Date of Service: [name], [place] 10/20/2022 10:30 A M Medical Record Number: [id number] Patient Account Number: [id number] Date of Birth/Sex: Treating RN: Mar 21, 1976 (47 y.o. M) Primary Care Provider: [name] Other Clinician: Referring Provider: Treating Provider/Extender: [name] in Treatment: 4 Information Obtained From Patient Constitutional Symptoms (General Health) Medical History: Past Medical History Notes: COVID PNA 07/22/2019-11/14/2019 VENT ECMO, foot drop left foot , Eyes Medical History: Negative for: Cataracts; Glaucoma; Optic Neuritis Ear/Nose/Mouth/Throat Medical History: Negative for: Chronic sinus problems/congestion; Middle ear problems Hematologic/Lymphatic Medical History: Negative for: Anemia; Hemophilia; Human Immunodeficiency Virus; Lymphedema; Sickle Cell Disease Respiratory HARALAMBOS, DEMIRJIAN ([id number]) [id number] Page 14 of 15 Medical History: Positive for: Asthma Negative for: Aspiration; Chronic Obstructive Pulmonary Disease (COPD); Pneumothorax; Sleep Apnea; Tuberculosis Cardiovascular Medical History: Positive for: Angina - with COVID; Hypertension Negative for: Arrhythmia; Congestive Heart  Failure; Coronary Artery Disease; Deep Vein Thrombosis; Hypotension; Myocardial Infarction; Peripheral Arterial Disease; Peripheral Venous Disease; Phlebitis; Vasculitis Gastrointestinal Medical History: Negative for: Cirrhosis ; Colitis; Crohns; Hepatitis A; Hepatitis B; Hepatitis C Endocrine Medical History: Negative for: Type I Diabetes; Type II Diabetes Genitourinary Medical History: Negative for: End Stage Renal Disease Past Medical History Notes: kidney stone Immunological Medical History: Negative for: Lupus Erythematosus; Raynauds; Scleroderma Integumentary (Skin) Medical History: Negative for: History of Burn Musculoskeletal Medical History: Negative for: Gout; Rheumatoid Arthritis; Osteoarthritis; Osteomyelitis Neurologic Medical History: Negative for: Dementia; Neuropathy; Quadriplegia; Paraplegia; Seizure Disorder Oncologic Medical History: Negative for: Received Chemotherapy; Received Radiation Psychiatric Medical History: Negative for: Anorexia/bulimia; Confinement Anxiety Past Medical History Notes: anxiety Immunizations Pneumococcal Vaccine: Received Pneumococcal Vaccination: No Implantable Devices None Hospitalization / Surgery History Type of Hospitalization/Surgery COVID PNA 07/22/2019- 11/14/2019 03/27/2020 wound debridement/ skin graft Family and Social History Cancer: Yes - Maternal Grandparents; Diabetes: Yes - Father,Paternal Grandparents;  Heart Disease: Yes - Maternal Grandparents; Hereditary Spherocytosis: No; Hypertension: Yes - Father,Paternal Grandparents; Kidney Disease: No; Lung Disease: Yes - Siblings; Seizures: No; Stroke: No; Thyroid Problems: No; Tuberculosis: No; Never smoker; Marital Status - Married; Alcohol Use: Never; Drug Use: No History; Caffeine Use: Daily - tea, soda; Financial Concerns: No; Food, Clothing or Shelter Needs: No; Support System Lacking: No; Transportation Concerns: No DAVIED, HAISLIP ([id number])  [id number] Page 15 of 15 Electronic Signature(s) Signed: 10/20/2022 3:34:03 PM By: Kalman Shan DO Entered By: Kalman Shan on 10/20/2022 10:53:01 -------------------------------------------------------------------------------- SuperBill Details Patient Name: Date of Service: [name], [place]. 10/20/2022 Medical Record Number: [id number] Patient Account Number: [id number] Date of Birth/Sex: Treating RN: 07/07/1976 (47 y.o. M) Primary Care Provider: [name] Other Clinician: Referring Provider: Treating Provider/Extender: [name] in Treatment: 118 Diagnosis Coding ICD-10 Codes Code Description [phone] Non-pressure chronic ulcer of other part of left foot with fat layer exposed Facility Procedures : CPT4 Code: [id number] Description: [phone] - DEBRIDE WOUND 1ST 20 SQ CM OR < ICD-10 Diagnosis Description L97.522 Non-pressure chronic ulcer of other part of left foot with fat layer exposed Modifier: Quantity: 1 Physician Procedures : CPT4 Code Description Modifier [id number] - WC PHYS DEBR WO ANESTH 20 SQ CM ICD-10 Diagnosis Description L97.522 Non-pressure chronic ulcer of other part of left foot with fat layer exposed Quantity: 1 Electronic Signature(s) Signed: 10/20/2022 3:34:03 PM By: Kalman Shan DO Entered By: Kalman Shan on 10/20/2022 10:55:27

## 2022-10-22 LAB — ALLERGENS W/COMP RFLX AREA 2

## 2022-10-23 LAB — ALLERGEN PROFILE, MOLD
Aureobasidi Pullulans IgE: <0.1 kU/L
Candida Albicans IgE: 0.1 kU/L — AB
M009-IgE Fusarium proliferatum: <0.1 kU/L
M014-IgE Epicoccum purpur: <0.1 kU/L
Mucor Racemosus IgE: <0.1 kU/L
Phoma Betae IgE: <0.1 kU/L
Setomelanomma Rostrat: <0.1 kU/L
Stemphylium Herbarum IgE: <0.1 kU/L

## 2022-10-23 LAB — ALLERGENS W/COMP RFLX AREA 2
Alternaria Alternata IgE: <0.1 kU/L
Aspergillus Fumigatus IgE: <0.1 kU/L
Bermuda Grass IgE: <0.1 kU/L
Cedar, Mountain IgE: <0.1 kU/L
Cladosporium Herbarum IgE: <0.1 kU/L
Cockroach, German IgE: <0.1 kU/L
Common Silver Birch IgE: <0.1 kU/L
Cottonwood IgE: <0.1 kU/L
D Farinae IgE: 8.67 kU/L — AB
D Pteronyssinus IgE: 7.54 kU/L — AB
E001-IgE Cat Dander: <0.1 kU/L
E005-IgE Dog Dander: 0.41 kU/L — AB
Elm, American IgE: <0.1 kU/L
IgE (Immunoglobulin E), Serum: 273 IU/mL (ref 6–495)
Johnson Grass IgE: <0.1 kU/L
Maple/Box Elder IgE: <0.1 kU/L
Mouse Urine IgE: <0.1 kU/L
Oak, White IgE: 0.27 kU/L — AB
Pecan, Hickory IgE: <0.1 kU/L
Penicillium Chrysogen IgE: <0.1 kU/L
Pigweed, Rough IgE: <0.1 kU/L
Ragweed, Short IgE: 0.22 kU/L — AB
Sheep Sorrel IgE Qn: <0.1 kU/L
Timothy Grass IgE: <0.1 kU/L
White Mulberry IgE: <0.1 kU/L

## 2022-10-23 LAB — PANEL 606648
E101-IgE Can f 1: <0.1 kU/L
E102-IgE Can f 2: <0.1 kU/L
E221-IgE Can f 3: <0.1 kU/L
E226-IgE Can f 5: <0.1 kU/L

## 2022-10-23 LAB — ALLERGEN COMPONENT COMMENTS

## 2022-10-24 ENCOUNTER — Encounter: Payer: Self-pay | Admitting: Physical Medicine and Rehabilitation

## 2022-10-24 ENCOUNTER — Encounter: Payer: PPO | Attending: Physical Medicine and Rehabilitation | Admitting: Physical Medicine and Rehabilitation

## 2022-10-24 VITALS — BP 140/75 | HR 87 | Ht 69.0 in | Wt 314.6 lb

## 2022-10-24 DIAGNOSIS — Z993 Dependence on wheelchair: Secondary | ICD-10-CM | POA: Insufficient documentation

## 2022-10-24 DIAGNOSIS — G7281 Critical illness myopathy: Secondary | ICD-10-CM | POA: Diagnosis not present

## 2022-10-24 DIAGNOSIS — M21372 Foot drop, left foot: Secondary | ICD-10-CM | POA: Insufficient documentation

## 2022-10-24 DIAGNOSIS — L97525 Non-pressure chronic ulcer of other part of left foot with muscle involvement without evidence of necrosis: Secondary | ICD-10-CM | POA: Insufficient documentation

## 2022-10-24 IMAGING — DX DG OS CALCIS 2+V*R*
2 series · 2 of 2 positions shown · non-contrast
Comparison: 01/16/2020

CLINICAL DATA: Nonhealing right heel wound

EXAM:
RIGHT OS CALCIS - 2+ VIEW

[calcaneus axial]
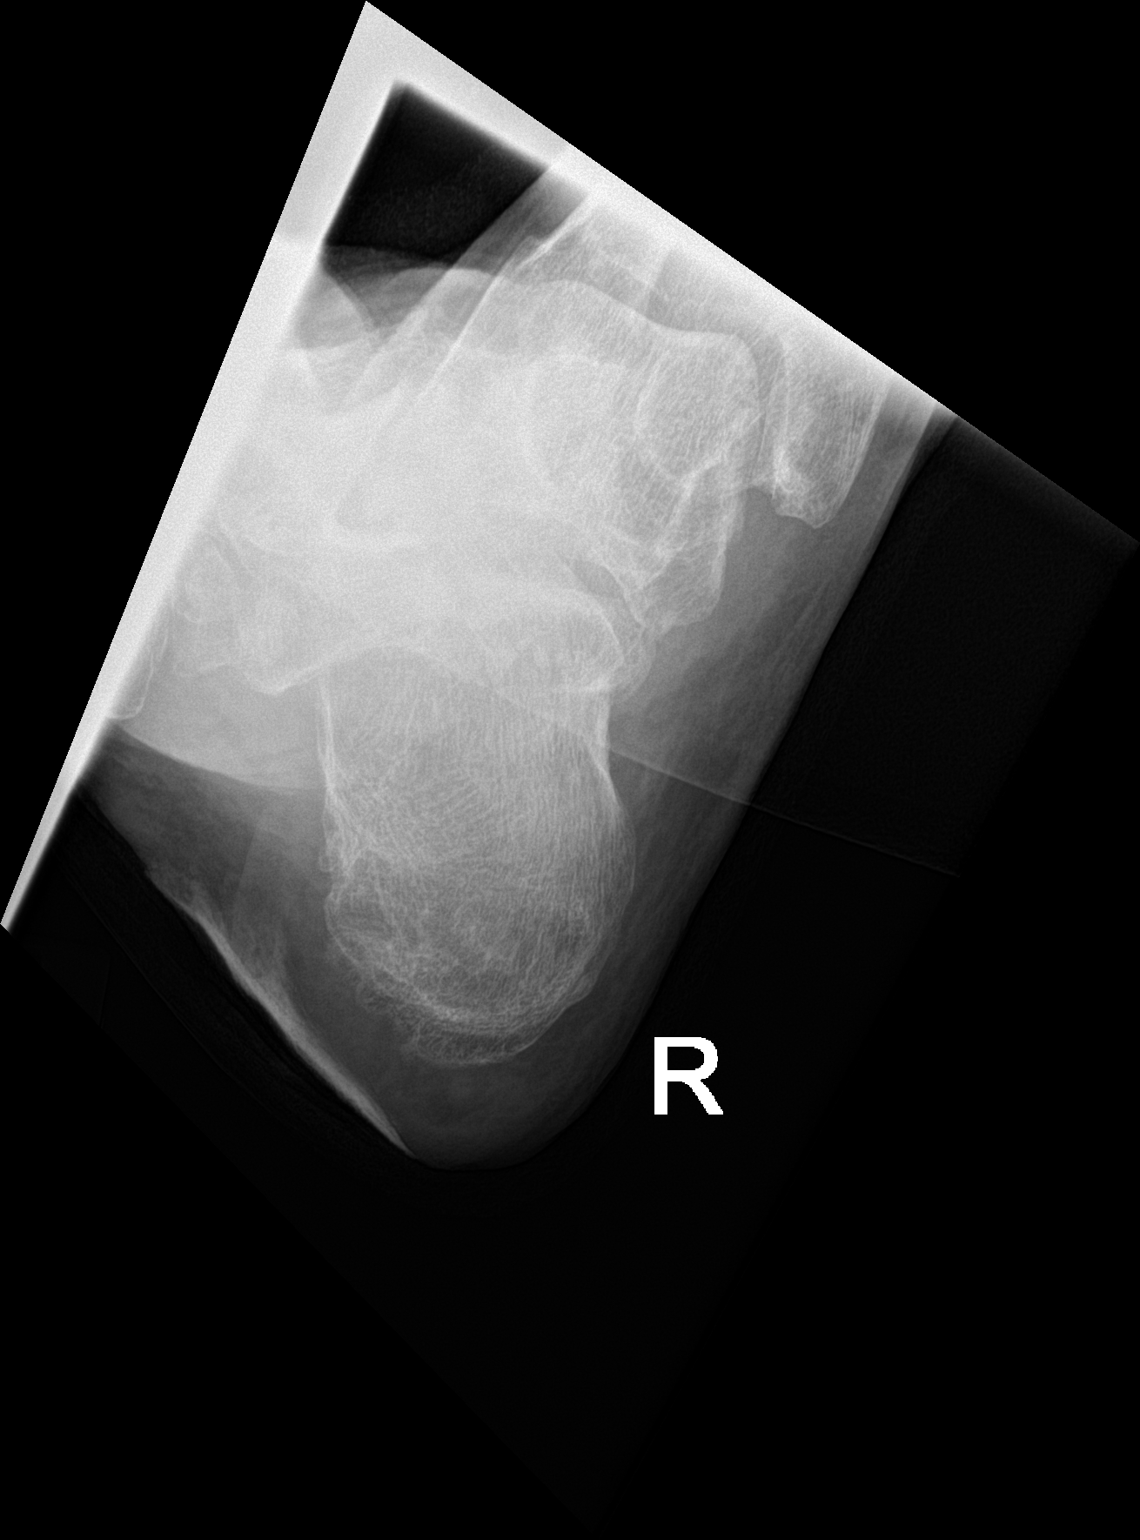

[calcaneus lat]
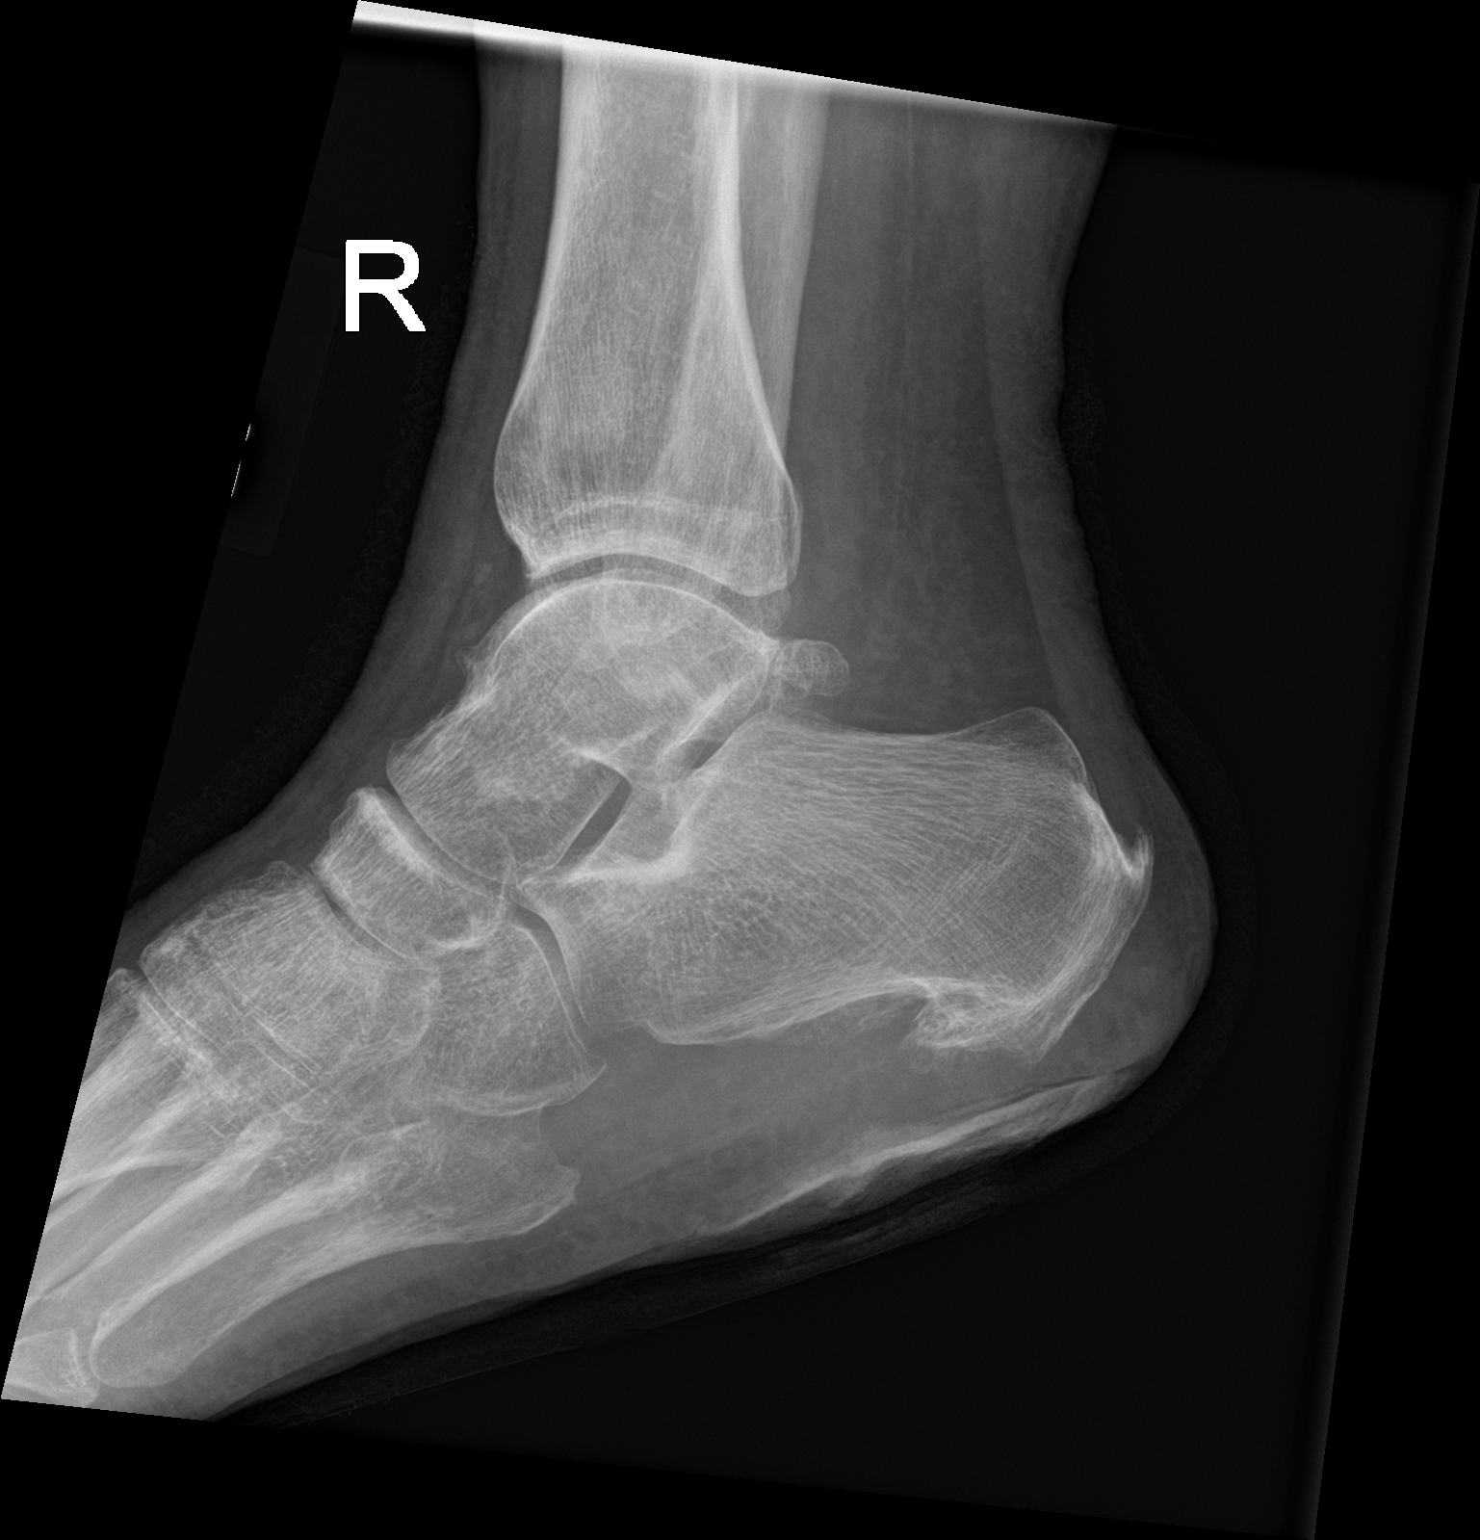

[2 of 2 positions shown; findings below may reference images not displayed]

FINDINGS: Soft tissue defect is noted inferiorly consistent with the known
history of heel wound. Calcaneal spurring is noted. Some erosive
changes are noted along the inferior aspect of the calcaneus
suspicious for osteomyelitis. MRI would be more sensitive in this
regard.
IMPRESSION: Erosive changes along the inferior aspect of the calcaneus
underneath the known soft tissue wound. These changes likely
represent osteomyelitis.

## 2022-10-24 MED ORDER — HYDROCODONE-ACETAMINOPHEN 5-325 MG PO TABS: 1.0000 | ORAL_TABLET | Freq: Two times a day (BID) | ORAL | 0 refills | Status: DC | PRN

## 2022-10-24 NOTE — Patient Instructions (Addendum)
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 Occurred 12/20.  Also has L foot drop. Still w/c dependent- still cannot put weight on feet per Plastics.  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    Allergy shots are usually 1x/week- for 1 year- check into it- because usually last 2+ years. However sounds like a good idea for pt.    2. Wants to not take pain meds all the time- will try moving to Norco 5/325 mg 2x/day as needed. #60- no refills- will try since was on Oxycodone prior-    3. Nuts are a good source of protein- stick to recommended portion- should be OK with that.  Discussed working on diet- is eating good for breakfast- just needs to work on lunch and dinner.  Individual packs could be helpful.  Go to grocery store- not hungry and make decision at grocery store to not bring it home- easier to make 1 decision than many decisions/"will power" at home.   4. L foot- thinking can bear weight in summer- per Wound Care.    5.  When heals and can bear weight on LLE- and got OK from Wound care, will order L foot AFO for gait- so hopefully can get him off other assistive devices.    6. If irritability doesn't improve with changes in pain meds, might want to think about Lamictal going up to 125 mg daily.  Discussed irritability at length.   7.F/U in 3 months- double appt-   8. Needs to do more in w/c! To exercise.    9. Poor sleep- suggest to Psychiatry to increase Trazodone.  Only uses 2L O2- not using CPAP.  Go back to sleep doctor- snoring all night- can try to reduce Trazodone dose- to 1.5 tabs- for now

## 2022-10-24 NOTE — Progress Notes (Signed)
Subjective:    Patient ID: [name], male    DOB: 08-27-1975, 47 y.o.   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 Occurred 12/20.  Also has L foot drop. Still w/c dependent- still cannot put weight on feet per Plastics.  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  Having a lot of sinus infections- had 2-3 in 6 weeks.  Amoxicllin and prednisone- x2. Went back to Allergist 2 weeks ago- and discussed allergy shots.   Hasn't gotten allergy shots- because not approved by insurance.   Has mood has been awful in last week- "ticking time bomb" per wife in last week.  Very edgy.   Cut back on Lamictal from 150 mg daily to 100 mg~ 1 month ago.  Did himself- because wants to get off it-  Tired of taking os many pills.   Got Rx for [facility]- trying to get it approved.  Plan for him "didn't work" because doesn't like to cook and "lazy".   Hasn't taken Oxycodone in 2 + months- ran out- but doesn't want to get addicted to it.  Ran out 2 months ago.  Never taken Norco-  Is rating pain 5/10.  Aches all over- all the time- feels like 47 years old.  Knees hurt really bad.    R foot still healed- tiny spot on L foot still Has been walking more around house last few weeks- tired of getting in w/c.   Same old thing for grumpiness- because stuck in w/c.    Pain Inventory Average Pain 5 Pain Right Now 5 My pain is sharp and stabbing  LOCATION OF PAIN  back, hi, knee, leg, ankle, toes  BOWEL Number of stools per week: 7-8 Oral laxative use No  Type of laxative . Enema or suppository use No  History of colostomy No  Incontinent No   BLADDER Normal In and out cath, frequency . Able to self cath  . Bladder incontinence No  Frequent urination No  Leakage with coughing No   Difficulty starting stream No  Incomplete bladder emptying No    Mobility how many minutes can you walk? 1 ability to climb steps?  yes do you drive?  no use a wheelchair transfers alone  Function not employed: date last employed . I need assistance with the following:  meal prep, household duties, and shopping  Neuro/Psych bowel control problems weakness numbness tingling trouble walking spasms dizziness depression anxiety loss of taste or smell  Prior Studies Any changes since last visit?  no  Physicians involved in your care Any changes since last visit?  no   Family History  Problem Relation Age of Onset   Migraines Mother    GER disease Mother    Heart disease Father    Hyperlipidemia Father    Diabetes Father    Hypertension Father    Sleep apnea Father    Obesity Father    Asthma Brother    Pancreatic cancer Maternal Grandmother    Heart disease Maternal Grandfather    Diabetes Paternal Grandmother    Hypertension Paternal Grandmother    Social History   Socioeconomic History   Marital status: Married    Spouse name: Not on file   Number of children:  1   Years of education: Not on file   Highest education level: Not on file  Occupational History   Occupation: delivery driver   Occupation: Stay at home spouse  Tobacco Use   Smoking status: Never   Smokeless tobacco: Never  Vaping Use   Vaping Use: Never used  Substance and Sexual Activity   Alcohol use: Yes    Comment: rarely 2-3 times a year   Drug use: No   Sexual activity: Yes    Partners: Female  Other Topics Concern   Not on file  Social History Narrative   Not on file   Social Determinants of Health   Financial Resource Strain: Not on file  Food Insecurity: Not on file  Transportation Needs: Not on file  Physical Activity: Not on file  Stress: Not on file  Social Connections: Not on file   Past Surgical History:  Procedure Laterality Date   CANNULATION FOR ECMO  (EXTRACORPOREAL MEMBRANE OXYGENATION) N/A 08/28/2019   Procedure: CANNULATION FOR VV ECMO (EXTRACORPOREAL MEMBRANE OXYGENATION);  Surgeon: Prescott Gum, Collier Salina, MD;  Location: [facility];  Service: Open Heart Surgery;  Laterality: N/A;  CRESCENT CANNULA   CANNULATION FOR ECMO (EXTRACORPOREAL MEMBRANE OXYGENATION) N/A 09/10/2019   Procedure: CANNULATION FOR ECMO (EXTRACORPOREAL MEMBRANE OXYGENATION) PUTTING IN CRESCENT 32FR CANNULA  AND REMOVING GROING CANNULATION;  Surgeon: Wonda Olds, MD;  Location: [facility];  Service: Open Heart Surgery;  Laterality: N/A;  PUTTING IN CRESCENT/REMOVING GROIN CANNULATION   CYSTOSCOPY/URETEROSCOPY/HOLMIUM LASER/STENT PLACEMENT Left 04/18/2019   Procedure: LEFT URETEROSCOPY/HOLMIUM LASER/STENT PLACEMENT;  Surgeon: Ardis Hughs, MD;  Location: [facility];  Service: Urology;  Laterality: Left;   CYSTOSCOPY/URETEROSCOPY/HOLMIUM LASER/STENT PLACEMENT Left 05/02/2019   Procedure: CYSTOSCOPY/URETEROSCOPY/HOLMIUM LASER/STENT EXCHANGE;  Surgeon: Ardis Hughs, MD;  Location: [facility];  Service: Urology;  Laterality: Left;   ECMO CANNULATION N/A 08/03/2019   Procedure: ECMO CANNULATION;  Surgeon: Wonda Olds, MD;  Location: [facility];  Service: Cardiovascular;  Laterality: N/A;   ESOPHAGOGASTRODUODENOSCOPY N/A 09/11/2019   Procedure: ESOPHAGOGASTRODUODENOSCOPY (EGD);  Surgeon: Wonda Olds, MD;  Location: [facility];  Service: Thoracic;  Laterality: N/A;   GRAFT APPLICATION Bilateral [id number]   Procedure: APPLICATION OF SKIN GRAFT BILATERAL FEET;  Surgeon: Edrick Kins, DPM;  Location: [facility];  Service: Podiatry;  Laterality: Bilateral;   IR REPLC GASTRO/COLONIC TUBE PERCUT W/FLUORO  10/14/2019   IRRIGATION AND DEBRIDEMENT SHOULDER Right 09/29/2017   Procedure: IRRIGATION AND DEBRIDEMENT SHOULDER;  Surgeon: Leandrew Koyanagi, MD;  Location: [facility];  Service: Orthopedics;  Laterality: Right;   LUMBAR DISC SURGERY  2002   NASAL ENDOSCOPY WITH EPISTAXIS  CONTROL N/A 08/31/2019   Procedure: NASAL ENDOSCOPY WITH EPISTAXIS CONTROL WITH CAUTERIZATION;  Surgeon: Melida Quitter, MD;  Location: [place];  Service: ENT;  Laterality: N/A;   PORTACATH PLACEMENT N/A 09/11/2019   Procedure: PEG TUBE INSERTION - BEDSIDE;  Surgeon: Wonda Olds, MD;  Location: [place];  Service: Thoracic;  Laterality: N/A;   TEE WITHOUT CARDIOVERSION N/A 08/28/2019   Procedure: TRANSESOPHAGEAL ECHOCARDIOGRAM (TEE);  Surgeon: Prescott Gum, Collier Salina, MD;  Location: [facility];  Service: Open Heart Surgery;  Laterality: N/A;   TEE WITHOUT CARDIOVERSION N/A 09/10/2019   Procedure: TRANSESOPHAGEAL ECHOCARDIOGRAM (TEE);  Surgeon: Wonda Olds, MD;  Location: [facility];  Service: Open Heart Surgery;  Laterality: N/A;   WOUND DEBRIDEMENT Bilateral 03/27/2020   Procedure: EXCISIONAL DEBRIDEMENT OF ULCERS BILATERAL FEET;  Surgeon: Edrick Kins, DPM;  Location: [facility];  Service: Podiatry;  Laterality: Bilateral;   Past Medical History:  Diagnosis Date   Anginal pain ([place])    with covid   Anxiety    Asthma    Depression    Dyspnea    GERD (gastroesophageal reflux disease)    Headache    History of kidney stones    LEFT URETERAL STONE   HTN (hypertension)    Pancreatitis 2018   GALLBALDDER SLUDGE CAUSED ISSUED RESOLVED   Pneumonia 07/2019   covid   Prediabetes    SOBOE (shortness of breath on exertion)    BP (!) 140/75   Pulse 87   Ht 5\' 9"  (1.753 m)   Wt (!) 314 lb 9.6 oz (142.7 kg)   SpO2 94%   BMI 46.46 kg/m   Opioid Risk Score:   Fall Risk Score:  `1  Depression screen [facility] 2/9     09/07/2022   10:07 AM 07/25/2022    9:46 AM 04/01/2022    9:53 AM 10/25/2021    1:45 PM 07/21/2021    3:12 PM 06/04/2021    1:53 PM 03/03/2021   10:08 AM  Depression screen PHQ 2/9  Decreased Interest 3 1 1   1 1   Down, Depressed, Hopeless 3 1 1   1 1   PHQ - 2 Score 6 2 2   2 2   Altered sleeping 3        Tired, decreased energy 3        Change in appetite 2        Feeling bad or failure  about yourself  1        Trouble concentrating 2        Moving slowly or fidgety/restless 1        Suicidal thoughts 0        PHQ-9 Score 18        Difficult doing work/chores Very difficult           Information is confidential and restricted. Go to Review Flowsheets to unlock data.      Review of Systems  Constitutional:  Positive for unexpected weight change.  Respiratory:  Positive for cough, shortness of breath and wheezing.   Neurological:  Positive for dizziness, weakness and numbness.  All other systems reviewed and are negative.     Objective:   Physical Exam Awake, alert, appropriate, sitting in w/c, NAD L foot in healing shoe L foot drop-  DF is 2-/5 and PF 4+/5 Weight pretty much stable appearing.       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 Occurred 12/20.  Also has L foot drop. Still w/c dependent- still cannot put weight on feet per Plastics.  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    Allergy shots are usually 1x/week- for 1 year- check into it- because usually last 2+ years. However sounds like a good idea for pt.    2. Wants to not take pain meds all the time- will try moving to Norco 5/325 mg 2x/day as needed. #60- no refills- will try since was on Oxycodone prior-    3. Nuts are a good source of protein- stick to recommended portion- should be OK with that.  Discussed working on diet- is  eating good for breakfast- just needs to work on lunch and dinner.  Individual packs could be helpful.  Go to grocery store- not hungry- also make decision at grocery store to not bring it home- easier to make 1 decision than many decisions/"will power" at home.   4. L foot- thinking can bear weight in summer- per Wound Care.    5.  When heals and can  bear weight on LLE- and got OK from Wound care, will order L foot AFO for gait- so hopefully can get him off other assistive devices.    6. If irritability doesn't improve with changes in pain meds, might want to think about Lamictal going up to 125 mg daily.  Discussed irritability at length.   7.F/U in 3 months- double appt-   8. Needs to do more in w/c! To exercise.    9. Poor sleep- suggest to Psychiatry to increase Trazodone.  Only uses 2L O2- not using CPAP. Call sleep doctor and snoring all night.  Can try and reduce Trazodone to 1.5 tabs nightly.   I spent a total of 43   minutes on total care today- >50% coordination of care- due to  d/w pt about weight loss; allergies, and AFO; as well as irritability- as detailed above

## 2022-10-25 ENCOUNTER — Telehealth: Payer: Self-pay | Admitting: *Deleted

## 2022-10-25 NOTE — Telephone Encounter (Signed)
Called patient to inquire if he has extra help with his [facility] he stated he does not know. Will get approval and send to [place] to see what his copay is and if unaffordable will reach out and get him set up with PAP

## 2022-10-25 NOTE — Telephone Encounter (Signed)
-----   Message from Valentina Shaggy, MD sent at 10/18/2022  3:02 PM EDT ----- Cheron Every?

## 2022-10-26 ENCOUNTER — Other Ambulatory Visit (HOSPITAL_COMMUNITY): Payer: Self-pay

## 2022-10-26 ENCOUNTER — Other Ambulatory Visit: Payer: Self-pay

## 2022-10-26 MED ORDER — TEZSPIRE 210 MG/1.91ML ~~LOC~~ SOAJ
210.0000 mg | SUBCUTANEOUS | 11 refills | Status: DC
  Filled 2022-10-26: qty 1.91, 28d supply, fill #0

## 2022-10-26 NOTE — Telephone Encounter (Signed)
Woot woot!

## 2022-10-26 NOTE — Telephone Encounter (Signed)
Approval done and rx sent to [facility]

## 2022-10-27 ENCOUNTER — Encounter (HOSPITAL_BASED_OUTPATIENT_CLINIC_OR_DEPARTMENT_OTHER): Payer: PPO | Admitting: General Surgery

## 2022-10-27 DIAGNOSIS — L89623 Pressure ulcer of left heel, stage 3: Secondary | ICD-10-CM | POA: Diagnosis not present

## 2022-10-27 DIAGNOSIS — L97522 Non-pressure chronic ulcer of other part of left foot with fat layer exposed: Secondary | ICD-10-CM | POA: Diagnosis not present

## 2022-10-28 NOTE — Progress Notes (Signed)
HABIB, KIMMERLY ([id number]) [id number] Page 1 of 15 Visit Report for 10/27/2022 Chief Complaint Document Details Patient Name: Date of Service: [name], [place] 10/27/2022 10:15 A M Medical Record Number: [id number] Patient Account Number: [id number] Date of Birth/Sex: Treating RN: 1976-03-05 (47 y.o. M) Primary Care Provider: [name] Other Clinician: Referring Provider: Treating Provider/Extender: [name] in Treatment: [place] from: Patient Chief Complaint Bilateral Plantar Foot Ulcers Electronic Signature(s) Signed: 10/27/2022 11:05:50 AM By: Fredirick Maudlin MD FACS Entered By: Fredirick Maudlin on 10/27/2022 11:05:49 -------------------------------------------------------------------------------- Debridement Details Patient Name: Date of Service: [name], [place] 10/27/2022 10:15 A M Medical Record Number: [id number] Patient Account Number: [id number] Date of Birth/Sex: Treating RN: 1975/11/24 (47 y.o. M) Primary Care Provider: [name] Other Clinician: Referring Provider: Treating Provider/Extender: [name] in Treatment: 119 Debridement Performed for Assessment: Wound #2 Left Calcaneus Performed By: Physician Fredirick Maudlin, MD Debridement Type: Debridement Level of Consciousness (Pre-procedure): Awake and Alert Pre-procedure Verification/Time Out Yes - 10:35 Taken: Start Time: 10:35 Pain Control: Lidocaine 5% topical ointment T Area Debrided (L x W): otal 0.4 (cm) x 0.2 (cm) = 0.08 (cm) Tissue and other material debrided: Non-Viable, Callus, Slough, Subcutaneous, Skin: Epidermis, Slough Level: Skin/Subcutaneous Tissue Debridement Description: Excisional Instrument: Curette Bleeding: Minimum Hemostasis Achieved: Pressure End Time: 10:37 Procedural Pain: 0 Post Procedural Pain: 0 Response to Treatment: Procedure was tolerated well Level of Consciousness  (Post- Awake and Alert procedure): Post Debridement Measurements of Total Wound Length: (cm) 0.4 Stage: Category/Stage III Width: (cm) 0.2 Depth: (cm) 0.1 Volume: (cm) 0.006 Character of Wound/Ulcer Post Debridement: Improved Post Procedure Diagnosis Same as Pre-procedure Notes Scribed for Dr. [name] by J.Scotton Electronic Signature(s) Signed: 10/27/2022 11:05:42 AM By: Fredirick Maudlin MD FACS Entered By: Fredirick Maudlin on 10/27/2022 11:05:42 [name] ([id number]) [id number] Page 2 of 15 -------------------------------------------------------------------------------- HPI Details Patient Name: Date of Service: [name] [place] 10/27/2022 10:15 A M Medical Record Number: [id number] Patient Account Number: [id number] Date of Birth/Sex: Treating RN: Apr 20, 1976 (47 y.o. M) Primary Care Provider: [name] Other Clinician: Referring Provider: Treating Provider/Extender: [name] in Treatment: 119 History of Present Illness HPI Description: Wounds are12/03/2020 upon evaluation today patient presents for initial inspection here in our clinic concerning issues he has been having with the bottoms of his feet bilaterally. He states these actually occurred as wounds when he was hospitalized for 5 months secondary to Covid. He was apparently with tilting bed where he was in an upright position quite frequently and apparently this occurred in some way shape or form during that time. Fortunately there is no sign of active infection at this time. No fevers, chills, nausea, vomiting, or diarrhea. With that being said he still has substantial wounds on the plantar aspects of his feet Theragen require quite a bit of work to get these to heal. He has been using Santyl currently though that is been problematic both in receiving the medication as well as actually paid for it as it is become quite expensive. Prior to the experience with  Covid the patient really did not have any major medical problems other than hypertension he does have some mild generalized weakness following the Covid experience. 07/22/2020 on evaluation today patient appears to be doing okay in regard to his foot ulcers I feel like the wound beds are showing signs of better improvement that I do believe the Iodoflex is helping in this regard. With that being  said he does have a lot of drainage currently and this is somewhat blue/green in nature which is consistent with Pseudomonas. I do think a culture today would be appropriate for [place] to evaluate and see if that is indeed the case I would likely start him on antibiotic orally as well he is not allergic to Cipro knows of no issues he has had in the past 12/21; patient was admitted to the clinic earlier this month with bilateral presumed pressure ulcers on the bottom of his feet apparently related to excessive pressure from a tilt table arrangement in the intensive care unit. Patient relates this to being on ECMO but I am not really sure that is exactly related to that. I must say I have never seen anything like this. He has fairly extensive full-thickness wounds extending from his heel towards his midfoot mostly centered laterally. There is already been some healing distally. He does not appear to have an arterial issue. He has been using gentamicin to the wound surfaces with Iodoflex to help with ongoing debridement 1/6; this is a patient with pressure ulcers on the bottom of his feet related to excessive pressure from a standing position in the intensive care unit. He is complaining of a lot of pain in the right heel. He is not a diabetic. 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 We have been using Iodoflex to help prepare the surfaces of both wounds for an advanced treatment product. He is nonambulatory spending most of his time in a wheelchair I have asked him not to propel the  wheelchair with his heels 1/13; in general his wounds look better not much surface area change we have been using Iodoflex as of last week. I did an x-ray of the right heel as the patient was complaining of pain. I had some thoughts about a stress fracture perhaps Achilles tendon problems however what it showed was erosive changes along the inferior aspect of the calcaneus he now has a MRI booked for 1/20. 1/20; in general his wounds continue to be better. Some improvement in the large narrow areas proximally in his foot. He is still complaining of pain in the right heel and tenderness in certain areas of this wound. His MRI is tonight. I am not just looking for osteomyelitis that was brought up on the x-ray I am wondering about stress fractures, tendon ruptures etc. He has no such findings on the left. Also noteworthy is that the patient had critical illness neuropathy and some of the discomfort may be actual improvement in nerve function I am just not sure. These wounds were initially in the setting of severe critical illness related to COVID-19. He was put in a standing position. He may have also been on pressors at the point contributing to tissue ischemia. By his description at some point these wounds were grossly necrotic extending proximally up into the Achilles part of his heel. I do not know that I have ever really seen pictures of them like this although they may exist in epic We have ordered Tri layer Oasis. I am trying to stimulate some granulation in these areas. This is of course assuming the MRI is negative for infection 1/27; since the patient was last here he saw Dr. Juleen China of infectious disease. He is planned for vancomycin and ceftriaxone. Prior operative culture grew MSSA. Also ordered baseline lab work. He also ordered arterial studies although the ABIs in our clinic were normal as well as his clinical  exam these were normal I do not think he needs to see vascular surgery. His  ABIs at the PTA were 1.22 in the right triphasic waveforms with a normal TBI of 1.15 on the left ABI of 1.22 with triphasic waveforms and a normal TBI of 1.08. Finally he saw Dr. Amalia Hailey who will follow him in for 2 months. At this point I do not think he felt that he needed a procedure on the right calcaneal bone. Dr. Juleen China is elected for broad-spectrum antibiotic The patient is still having pain in the right heel. He walks with a walker 2/3; wounds are generally smaller. He is tolerating his IV antibiotics. I believe this is vancomycin and ceftriaxone. We are still waiting for Oasis burn in terms of his out-of-pocket max which he should be meeting soon given the IV antibiotics, MRIs etc. I have asked him to check in on this. We are using silver collagen in the meantime the wounds look better 2/10; tolerating IV vancomycin and Rocephin. We are waiting to apply for Oasis. Although I am not really sure where he is in his out-of-pocket max. 2/17 started the first application of Oasis trilayer. Still on antibiotics. The wounds have generally look better. The area on the left has a little more surface slough requiring debridement [id number]; second application of Oasis trilayer. The wound surface granulation is generally look better. The area on the left with undermining laterally I think is come in a bit. 10/08/2020 upon evaluation today patient is here today for [employer] application #3. Fortunately he seems to be doing extremely well with regard to this and we are seeing a lot of new epithelial growth which is great news. Fortunately there is no signs of active infection at this time. 10/16/2020 upon evaluation today patient appears to be doing well with regard to his foot ulcers. Do believe the Oasis has been of benefit for him. I do not see any signs of infection right now which is great news and I think that he has a lot of new epithelial growth which is great to see as well. The patient is  very pleased to hear all of this. I do think we can proceed with the Oasis trilayer #4 today. 3/18; not as much improvement in these areas on his heels that I was hoping. I did reapply trilateral Oasis today the tissue looks healthier but not as much fill in as I was hoping. 3/25; better looking today I think this is come in a bit the tissue looks healthier. Triple layer Oasis reapplied #6 4/1; somewhat better looking definitely better looking surface not as much change in surface area as I was hoping. He may be spending more time [place] on days then he needs to although he does have heel offloading boots. Triple layer Oasis reapplied #7 4/7; unfortunately apparently [facility] will not approve any further Oasis which is unfortunate since the patient did respond nicely both in terms of the condition of the wound bed as well as surface area. There is still some drainage coming from the wound but not a lot there does not appear to be any infection [name], [name] ([id number]) [phone].pdf Page 3 of 15 4/15; we have been using Hydrofera Blue. He continues to have nice rims of epithelialization on the right greater than the left. The left the epithelialization is coming from the tip of his heel. There is moderate drainage. In this that concerns me about a total contact cast. There is  no evidence of infection 4/29; patient has been using Hydrofera Blue with dressing changes. He has no complaints or issues today. 5/5; using Hydrofera Blue. I actually think that he looks marginally better than the last time I saw this 3 weeks ago. There are rims of epithelialization on the left thumb coming from the medial side on the right. Using Hydrofera Blue 5/12; using Hydrofera Blue. These continue to make improvements in surface area. His drainage was not listed as severe I therefore went ahead and put a cast on the left foot. Right foot we will continue to dress his  previous 5/16; back for first total contact cast change. He did not tolerate this particularly well cast injury on the anterior tibia among other issues. Difficulty sleeping. I talked him about this in some detail and afterwards is elected to continue. I told him I would like to have a cast on for 3 weeks to see if this is going to help at all. I think he agreed 5/19; I think the wound is better. There is no tunneling towards his midfoot. The undermining medially also looks better. He has a rim of new skin distally. I think we are making progress here. The area on the left also continues to look somewhat better to me using Hydrofera Blue. He has a list of complaints about the cast but none of them seem serious 5/26; patient presents for 1 week follow-up. He has been using a total contact cast and tolerating this well. Hydrofera Blue is the main dressing used. He denies signs of infection. 6/2 Hydrofera Blue total contact cast on the left. These were large ulcers that formed in intensive care unit where the patient was recovering from [place]. May have had something to do with being ventilated in an upright positiono Pressors etc. We have been able to get the areas down considerably and a viable surface. There is some epithelialization in both sides. Note made of drainage 6/9; changed to [facility] last time because of drainage. He arrives with better looking surfaces and dimensions on the left than the right. Paradoxically the right actually probes more towards his midfoot the left is largely close down but both of these look improved. Using a total contact cast on the left 6/16; complex wounds on his bilateral plantar heels which were initially pressure injury from a stay in the ICU with COVID. We have been using silver alginate most recently. His dimensions of come in quite dramatically however not recently. We have been putting the left foot in a total contact cast 6/23; complex wounds on the  bilateral plantar heels. I been putting the left in the cast paradoxically the area on the right is the one that is going towards closure at a faster rate. Quite a bit of drainage on the left. The patient went to see Dr. Amalia Hailey who said he was going to standby for skin grafts. I had actually considered sending him for skin grafts however he would be mandatorily off his feet for a period of weeks to months. I am thinking that the area on the right is going to close on its own the area on the left has been more stubborn even though we have him in a total contact cast 6/30; took him out of a total contact cast last week is the right heel seem to be making better progress than the left where I was placing the cast. We are using silver alginate. Both wounds are smaller right greater than  left 7/12; both wounds look as though they are making some progress. We are using silver alginate. Heel offloading boots 7/26; very gradual progress especially on the right. Using silver alginate. He is wearing heel offloading boots 8/18; he continues to close these wounds down very gradually. Using silver alginate. The problem polymen being definitive about this is areas of what appears to be callus around the margins. This is not a 100% of the area but certainly sizable especially on the right 9/1; bilateral plantar feet wounds secondary to prolonged pressure while being ventilated for COVID-19 in an upright position. Essentially pressure ulcers on the bottom of his feet. He is made substantial progress using silver alginate. 9/14; bilateral plantar feet wounds secondary to prolonged pressure. Making progress using silver alginate. 9/29 bilateral plantar feet wounds secondary to prolonged pressure. I changed him to Iodoflex last week. MolecuLight showing reddened blush fluorescence 10/11; patient presents for follow-up. He has no issues or complaints today. He denies signs of infection. He continues to use Iodoflex and  antibiotic ointment to the wound beds. 10/27; 2-week follow-up. No evidence of infection. He has callus and thick dry skin around the wound margins we have been using Iodoflex and Bactroban which was in response to a moderate left MolecuLight reddish blush fluorescence. 11/10; 2-week follow-up. Wound margins again have thick callus however the measurements of the actual wound sites are a lot smaller. Everything looks reasonably healthy here. We have been using Iodoflex He was approved for prime matrix but I have elected to delay this given the improvement in the surface area. Hopefully I will not regret that decision as were getting close to the end of the year in terms of insurance payment 12/8; 2-week follow-up. Wounds are generally smaller in size. These were initially substantial wounds extending into the forefoot all the way into the heel on the bilateral plantar feet. They are now both located on the plantar heel distal aspect both of these have a lot of callus around the wounds I used a #5 curette to remove this on the right and the left also some subcutaneous debris to try and get the wound edges were using Iodoflex. He has heel offloading shoe 12/22; 2-week follow-up. Not really much improvement. He has thick callus around the outer edges of both wounds. I remove this there is some nonviable subcutaneous tissue as well. We have been using Iodoflex. Her intake nurse and myself spontaneously thought of a total contact cast I went back in May. At that time we really were not seeing much of an improvement with a cast although the wound was in a much different situation I would like to retry this in 2 weeks and I discussed this with the patient 08/12/2021; the patient has had some improvement with the Iodoflex. The the area on the left heel plantar more improved than the right. I had to put him in a total contact cast on the left although I decided to put that off for 2 weeks. I am going to change  his primary dressing to silver collagen. I think in both areas he has had some improvement most of the healing seems to be more proximal in the heel. The wounds are in the mid aspect. A lot of thick callus on the right heel however. 1/19; we are using silver collagen on both plantar heel areas. He has had some improvement today. The left did not require any debridement. He still had some eschar on the right that was debrided  but both seem to have contracted. I did not put it total contact cast on him today 2/2 we have been using silver collagen. The area on the right plantar heel has areas that appear to be epithelialized interspersed with dry flaking callus and dry skin. I removed this. This really looks better than on the other side. On the left still a large area with raised edges and debris on the surface. 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 He has no issues or complaints today. 2/9; not much change in the left foot wound with 1 week of a cast we are using silver collagen. Silver collagen on the right side. The right side has been the better wound surface. We will reapply the total contact cast on the left 2/16; not much improvement on either side I been using silver collagen with a total contact cast on the left. I'm changing the Hydrofera Blue still with a total contact cast on the left 2/23; some improvement on both sides. Disappointing that he has thick callus around the area that we are putting in a total contact cast on the left. We've been using Hydrofera Blue on both wound areas. This is a man who at essentially pressure ulcers in addition to ischemia caused by medications to support his blood pressure (pressors) in the ICU. He was being ventilated in the standing position for severe Covid. A Shiley the wounds extended across his entire foot but are  now localized to his plantar heels bilaterally. We have made progress however neither areas healed. I continue to think the total contact cast is helped albeit painstakingly slowly. He has never wanted a plastic surgery consult although I don't know that they would be interested in grafting in area in this location. 10/07/2021: Continued improvement bilaterally. There is still some callus around the left wound, despite the total contact cast. He has some increased pain in his right midfoot around 1 particular area. This has been painful in the past but seems to be a little bit worse. When his cast was removed today, there was an area on the heel of the left foot that looks a bit macerated. He is also complaining of pain in his left thigh and hip which he thinks is secondary to the limb length discrepancy caused by the cast. 10/14/2021: He continues to improve. A little bit less callus around the left wound. He continues to endorse pain in his right midfoot, but this is not as significant as it was last week. The maceration on his left heel is improved. 10/21/2021: Continued improvement to both wounds. The maceration on his left heel is no longer evident. Less callus bilaterally. Epithelialization progressing. [name], [name] ([id number]) [id number] Page 4 of 15 10/28/2021: Significant improvement this week. The right sided wound is nearly closed with just a small open area at the middle. No maceration seen on the left heel. Continued epithelialization on both sides. No concern for infection. 11/04/2021: T oday, the wounds were measured a little bit differently and come out as larger, but I actually think they are about the same to potentially even smaller, particularly on the left. He continues to accumulate some callus on the right. 11/11/2021: T oday, the patient is expressing some concern that the left wound, despite being in the total contact cast, is not progressing at the same  rate as  the 1 on the right. He is interested in trying a week without the cast to see how the wound does. The wounds are roughly the same size as last week, with the right perhaps being a little bit smaller. He continues to build up callus on both sites. 11/18/2021: Last week, I permitted the patient to go without his total contact cast, just to see if the cast was really making any difference. Today, both wounds have deteriorated to some extent, suggesting that the cast is providing benefit, at least on the left. Both are larger and have accumulated callus, slough, and other debris. 11/26/2021: I debrided both wounds quite aggressively last week in an effort to stimulate the healing cascade. This appears to have been effective as the left sided wound is a full centimeter shorter in length. Although the right was measured slightly larger, on inspection, it looks as though an area of epithelialized tissue was included in the measurements. We have been using PolyMem Ag on the wound surfaces with a total contact cast to the left. 12/02/2021: It appears that the intake personnel are including epithelialized tissue in his wound measurements; the right wound is almost completely epithelialized; there is just a crater at the proximal midfoot with some open areas. On the left, he has built up some callus, but the overall wound surface looks good. There is some senescent skin around the wound margin. He has been in PolyMem Ag bilaterally with a total contact cast on the left. 12/09/2021: The right wound is nearly closed; there is just a small open area at the mid calcaneus. On the left, the wound is smaller with minimal callus buildup. No significant drainage. 12/16/2021: The right calcaneal wound remains minimally open at the mid calcaneus; the rest has epithelialized. On the left, the wound is also a little bit smaller. There is some senescent tissue on the periphery. He is getting his first application of a trial  skin substitute called Vendaje today. 12/23/2021: The wound on his right calcaneus is nearly closed; there is just a small area at the most distal aspect of the calcaneus that is open. On the left, the area where we applied to the skin substitute has a healthier-looking bed of granulation tissue. The wound dimensions are not significantly different on this side but the wound surface is improved. 12/30/2021: The wound on the right calcaneus has not changed significantly aside from some accumulation of callus. On the left, the open area is smaller and continues to have an improved surface. He continues to accumulate callus around the wound. He is here for his third application of Vendaje. 01/06/2022: The right calcaneal wound is down to just a couple of millimeters. He continues to accumulate periwound callus. He unfortunately got his cast wet earlier in the week and his left foot is macerated, resulting in some superficial skin loss just distal to the open wound. The open wound itself, however, is much smaller and has a healthier appearing surface. He is here for his fourth application of Vendaje. 01/13/2022: The right calcaneal wound is about the same. Unfortunately, once again, his cast got wet and his foot is again macerated. This is caused the left calcaneal wound to enlarge. He is here for his fifth application of Vendaje. 01/20/2022: The right calcaneal wound is very small. There is some periwound callus accumulation. He purchased a new cast protector last week and this has been effective in avoiding the maceration that has been occurring on the left. The left calcaneal  wound is narrower and has a healthy and viable-appearing surface. He is here for his 6 application of Vendaje. 01/27/2022: The right calcaneal wound is down to just a pinhole. There is some periwound slough and callus. On the left, the wound is narrower and shorter by about a centimeter. The surface is robust and viable-appearing.  Unfortunately, the rep for the trial skin substitute product did not provide any for [place] to use today. 02/04/2022: The right calcaneal wound remains unchanged. There is more accumulated callus. On the left, although the intake nurse measured it a little bit longer, it looks about the same to me. The surface has a layer of slough, but underneath this, there is good granulation tissue. 02/10/2022: The right calcaneus wound is nearly closed. There is still some callus that builds up around the site. The left side looks about the same in terms of dimensions, but the surface is more robust and vital-appearing. 02/16/2022: The area of the right calcaneus that was nearly closed last week has closed, but there is a small opening at the mid foot where it looks like some moisture got retained and caused some reopening. The left foot wound is narrower and shallower. Both sites have a fair amount of periwound callus and eschar. 02/24/2022: The small midfoot opening on the right calcaneus is a little bit smaller today. The left foot wound is narrower and shallower. He continues to accumulate periwound callus. No concern for infection. 03/01/2022: The patient came to clinic early because he showered and got his cast wet. Fortunately, there is no significant maceration to his foot but the callus softened and it looks like the wound on his left calcaneus may be a little bit wider. The wound on his right calcaneus is just a narrow slit. Continued accumulation of periwound callus bilaterally. 03/08/2022: The wound on his right calcaneus is very nearly closed, just a small pinpoint opening under a bit of eschar; the left wound has come in quite a bit, as well. It is narrower and shorter than at our last visit. Still with accumulated callus and eschar bilaterally. 03/17/2022: The right calcaneal wound is healed. The left wound is smaller and the surface itself is very clean, but there is some blue-green staining on the periwound  callus, concerning for Pseudomonas aeruginosa. 03/23/2022: The right calcaneal wound remains closed. The left wound continues to contract. No further blue-green staining. Small amount of callus and slough accumulation. 03/28/2022: He came in early today because he had gotten his cast wet. On inspection, the wound itself did not get wet or macerated, just a little bit of the forefoot. The wound itself is basically unchanged. 04/07/2022: The right foot wound remains closed. The left wound is the smallest that I have seen it to date. It is narrower and shorter. It still continues to accumulate slough on the surface. 04/15/2022: There is a band of epithelium now dividing the small left plantar foot wound in 2. There is still some slough on the surface. 04/21/2022: The wound continues to narrow. Just a little bit of slough on the surface. He seems to be responding well to endoform. 04/28/2022: Continued slow contraction of the wound. There is a little slough on the surface and some periwound callus. We have been using endoform and total contact cast. 05/05/2022: The wound appears to have stalled. There is slough and some periwound eschar/callus. No concern for infection, however. 05/12/2022: Unfortunately, his right foot has reopened. It is located at the most posterior aspect of his  surgical incision. The area was noted to have drainage coming from it when his padding was removed today. Underneath some callus and senescent skin, there is an opening. No purulent drainage or malodor. On LINDBURGH, [name] ([id number]) [id number] Page 5 of 15 the left foot, the wound is again unchanged. There is some light blue staining on the callus, but no malodor or purulent drainage. 10/13; right and left heel remanence of extensive plantar foot wounds. These are better than I remember by quite a big margin however he is still left with wounds on the left plantar heel and the right plantar heel. Been  using endoform bilaterally. A culture was done that showed apparently Pseudomonas but we are still waiting for the [facility] antibiotic to use gentamicin today. He is still very active by description I am not sure about the offloading of his noncasted right foot 10/20; both wounds right and left heel debrided not much change from last week. [employer] has arrived which is linezolid, gentamicin and ciprofloxacin we will use this with endoform. T contact cast on the left otal 06/02/2022: Both wounds are smaller today. There is still a fair amount of callus buildup around the right foot ulcer. The left is more superficial and nearly flush with the surrounding tissues. Also with slough and eschar buildup. 06/10/2022: The right sided wound appears to be nearly closed, if not completely so, although it is somewhat difficult to tell given the abnormal tissue and scarring in his foot. There is a fair amount of callus and crust accumulation. On the left, the wound looks about the same, again with callus and slough. He has an appointment next Thursday with Dr. Loel Lofty in podiatry; I am hopeful that there may be some reconstructive options available for Mr. Carlone. 06/16/2022: Both wounds have some eschar and callus accumulation. The right sided wound is extremely narrow and barely open; the left is narrower than last week. There is a little bit of slough. He has his appointment in podiatry later today. 06/23/2022: The patient met with Dr. Loel Lofty last week and unfortunately, there are no reconstructive options that he believes would be helpful. He did order an MRI to evaluate for osteomyelitis and fortunately, none was seen. The left sided wound is a little bit shorter and narrower today. The right sided wound is about the same. There is callus and eschar accumulation bilaterally. 06/29/2022: Both feet have improved from last week. There is epithelium making a valiant effort to creep across the surface on  the left. The right side looks like it got a little dry and the deep crevasse in his midfoot has cracked. Both have eschar and there is some slough on the left. 07/07/2022: Both feet have improved. There is epithelium completely covering the calcaneus on the right with just a small opening in the crevasse in his midfoot. On the left, the open area of tissue is smaller but he continues to build up callus/eschar and slough. 07/15/2022: The opening in the midfoot on the right is about the same size, covered with eschar and a little bit of slough. The open portion of the left wound is narrower and shorter with a bit of slough buildup. He admits to being on his feet more than recommended. 12/14; as far as I can tell everything on the right foot is closed. There is some eschar I removed some of this I cannot identify any open wound here. As usual this will be a very vulnerable area going forward. On the left  this looks really quite healthy. I was pleasantly surprised to see how good this looked. Wound is certainly smaller and there appears to be healthy epithelialization. He has been using Promogran on the right and endoform on the left. He has been offloading the right foot with a heel offloading boot and he has a running shoe on the right foot 08/04/2022: The right foot remains closed. He has a thick cushioned insole in his sneaker. The left sided wound is smaller with just some slough and eschar accumulation. He is wearing the heel off loader on this foot. 08/15/2022: The right foot remains closed. The left wound has narrowed further. There is some slough and eschar accumulation. 08/25/2022: We put him in a peg assist shoe insert and as a result, he has more epithelialization of the ulcer with minimal slough and eschar accumulation. 09/01/2022: The wound is smaller by about half this week. Still with some slough on the surface. The peg assist shoe seems to be doing a remarkable job of adequately offloading  the site. 09/08/2022: There is a little bit more epithelium coming in. There is some slough and callus buildup. 09/16/2022: The wound measures about the same size, but the epithelium that has grown in looks more robust and stronger. There is some slough and callus buildup. 09/23/2022: The wound remains about the same size. The skin edges are looking rather senescent. 09/29/2022: The aggressive debridement I performed last week seems to have been effective. The wound is smaller and has significantly less slough accumulation. 10/06/2022: There has been quite a bit of epithelialization since last week. There are still some open areas with slough accumulation. There is some callus buildup around the perimeter. 10/13/2022: Continued improvement. Minimal callus accumulation. 3/14; patient presents for follow-up. He has been using endoform to the wound bed without issues. He is using a surgical shoe with peg assist for offloading. 10/27/2022: The wound dimensions are stable. There is some senescent skin accumulation around the perimeter. Electronic Signature(s) Signed: 10/27/2022 11:06:39 AM By: Fredirick Maudlin MD FACS Entered By: Fredirick Maudlin on 10/27/2022 11:06:39 -------------------------------------------------------------------------------- Physical Exam Details Patient Name: Date of Service: [name], [place] 10/27/2022 10:15 A M Medical Record Number: [id number] Patient Account Number: [id number] Date of Birth/Sex: Treating RN: Feb 27, 1976 (47 y.o. M) Primary Care Provider: [name] Other Clinician: Referring Provider: Treating Provider/Extender: [name] in Treatment: 119 Constitutional . . . . no acute distress. [name], [name] ([id number]) [id number] Page 6 of 15 Respiratory Normal work of breathing on room air. Notes 10/27/2022: The wound dimensions are stable. There is some senescent skin accumulation around the  perimeter. Electronic Signature(s) Signed: 10/27/2022 11:07:33 AM By: Fredirick Maudlin MD FACS Entered By: Fredirick Maudlin on 10/27/2022 11:07:33 -------------------------------------------------------------------------------- Physician Orders Details Patient Name: Date of Service: [name], [place] 10/27/2022 10:15 A M Medical Record Number: [id number] Patient Account Number: [id number] Date of Birth/Sex: Treating RN: 20-Jun-1976 (47 y.o. [name] Primary Care Provider: [name] Other Clinician: Referring Provider: Treating Provider/Extender: [name] in Treatment: [phone] Verbal / Phone Orders: No Diagnosis Coding ICD-10 Coding Code Description [phone] Non-pressure chronic ulcer of other part of left foot with fat layer exposed Follow-up Appointments ppointment in 1 week. - Dr. [name] Room 3 Return A Anesthetic Wound #2 Left Calcaneus (In clinic) Topical Lidocaine 4% applied to wound bed - In clinic Bathing/ Shower/ Hygiene May shower and wash wound with soap and water. Edema Control - Lymphedema / SCD /  Other Bilateral Lower Extremities Avoid standing for long periods of time. Moisturize legs daily. - as needed Off-Loading Other: - keep pressure off of the bottom of your feet. Elevate legs throughout the day. Use the Shoe with the PegAssist off-loading insole Additional Orders / Instructions Follow Nutritious Diet - Try to get 70-100 grams of Protein a day+ Wound Treatment Wound #2 - Calcaneus Wound Laterality: Left Cleanser: Normal Saline (Generic) Every Other Day/30 Days Discharge Instructions: Cleanse the wound with Normal Saline prior to applying a clean dressing using gauze sponges, not tissue or cotton balls. Cleanser: Wound Cleanser (Generic) Every Other Day/30 Days Discharge Instructions: Cleanse the wound with wound cleanser prior to applying a clean dressing using gauze sponges, not tissue or cotton balls. Topical: compounding  topical antibiotics from [place] Every Other Day/30 Days Discharge Instructions: apply directly to wound bed. (KEYSTONE) Prim Dressing: Endoform 2x2 in (Generic) Every Other Day/30 Days ary Discharge Instructions: Moisten with saline or Hydrogel Secondary Dressing: Optifoam Non-Adhesive Dressing, 4x4 in (Generic) Every Other Day/30 Days Discharge Instructions: Apply over primary dressing as directed. Secondary Dressing: Woven Gauze Sponge, Non-Sterile 4x4 in (Generic) Every Other Day/30 Days Discharge Instructions: Apply over primary dressing as directed. Secured With: 58M Medipore H Soft Cloth Surgical T ape, 4 x 10 (in/yd) (Generic) Every Other Day/30 Days Discharge Instructions: Secure with tape as directed. [name], [name] ([id number]) [id number] Page 7 of 15 Compression Wrap: Kerlix Roll 4.5x3.1 (in/yd) Every Other Day/30 Days Discharge Instructions: Apply Kerlix and Coban compression as directed. Electronic Signature(s) Signed: 10/27/2022 12:41:48 PM By: Fredirick Maudlin MD FACS Entered By: Fredirick Maudlin on 10/27/2022 11:08:03 -------------------------------------------------------------------------------- Problem List Details Patient Name: Date of Service: [name], [place] 10/27/2022 10:15 A M Medical Record Number: [id number] Patient Account Number: [id number] Date of Birth/Sex: Treating RN: 06-15-1976 (47 y.o. M) Primary Care Provider: [name] Other Clinician: Referring Provider: Treating Provider/Extender: [name] in Treatment: 119 Active Problems ICD-10 Encounter Code Description Active Date MDM Diagnosis L97.522 Non-pressure chronic ulcer of other part of left foot with fat layer exposed 09/03/2020 No Yes Inactive Problems ICD-10 Code Description Active Date Inactive Date L97.512 Non-pressure chronic ulcer of other part of right foot with fat layer exposed 09/03/2020 09/03/2020 L89.893 Pressure ulcer  of other site, stage 3 07/15/2020 07/15/2020 M62.81 Muscle weakness (generalized) 07/15/2020 07/15/2020 I10 Essential (primary) hypertension 07/15/2020 07/15/2020 M86.171 Other acute osteomyelitis, right ankle and foot 09/03/2020 09/03/2020 Resolved Problems Electronic Signature(s) Signed: 10/27/2022 11:04:09 AM By: Fredirick Maudlin MD FACS Entered By: Fredirick Maudlin on 10/27/2022 11:04:09 -------------------------------------------------------------------------------- Progress Note Details Patient Name: Date of Service: [name], [place] 10/27/2022 10:15 A M Medical Record Number: [id number] Patient Account Number: [id number] Date of Birth/Sex: Treating RN: 09-03-1975 (47 y.o. M) Primary Care Provider: [name] Other Clinician: Referring Provider: Treating Provider/Extender: [name] in Treatment: [address] [name], CHOWDHRY E ([id number]) [id number] Page 8 of 15 Chief Complaint Information obtained from Patient Bilateral Plantar Foot Ulcers History of Present Illness (HPI) Wounds are12/03/2020 upon evaluation today patient presents for initial inspection here in our clinic concerning issues he has been having with the bottoms of his feet bilaterally. He states these actually occurred as wounds when he was hospitalized for 5 months secondary to Covid. He was apparently with tilting bed where he was in an upright position quite frequently and apparently this occurred in some way shape or form during that time. Fortunately there is no sign of active infection at this time.  No fevers, chills, nausea, vomiting, or diarrhea. With that being said he still has substantial wounds on the plantar aspects of his feet Theragen require quite a bit of work to get these to heal. He has been using Santyl currently though that is been problematic both in receiving the medication as well as actually paid for it as it is become quite expensive. Prior to  the experience with Covid the patient really did not have any major medical problems other than hypertension he does have some mild generalized weakness following the Covid experience. 07/22/2020 on evaluation today patient appears to be doing okay in regard to his foot ulcers I feel like the wound beds are showing signs of better improvement that I do believe the Iodoflex is helping in this regard. With that being said he does have a lot of drainage currently and this is somewhat blue/green in nature which is consistent with Pseudomonas. I do think a culture today would be appropriate for [place] to evaluate and see if that is indeed the case I would likely start him on antibiotic orally as well he is not allergic to Cipro knows of no issues he has had in the past 12/21; patient was admitted to the clinic earlier this month with bilateral presumed pressure ulcers on the bottom of his feet apparently related to excessive pressure from a tilt table arrangement in the intensive care unit. Patient relates this to being on ECMO but I am not really sure that is exactly related to that. I must say I have never seen anything like this. He has fairly extensive full-thickness wounds extending from his heel towards his midfoot mostly centered laterally. There is already been some healing distally. He does not appear to have an arterial issue. He has been using gentamicin to the wound surfaces with Iodoflex to help with ongoing debridement 1/6; this is a patient with pressure ulcers on the bottom of his feet related to excessive pressure from a standing position in the intensive care unit. He is complaining of a lot of pain in the right heel. He is not a diabetic. 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 We have been using Iodoflex to help prepare the surfaces of both wounds for an advanced treatment product. He is nonambulatory spending most of his time in a wheelchair I have asked him not to  propel the wheelchair with his heels 1/13; in general his wounds look better not much surface area change we have been using Iodoflex as of last week. I did an x-ray of the right heel as the patient was complaining of pain. I had some thoughts about a stress fracture perhaps Achilles tendon problems however what it showed was erosive changes along the inferior aspect of the calcaneus he now has a MRI booked for 1/20. 1/20; in general his wounds continue to be better. Some improvement in the large narrow areas proximally in his foot. He is still complaining of pain in the right heel and tenderness in certain areas of this wound. His MRI is tonight. I am not just looking for osteomyelitis that was brought up on the x-ray I am wondering about stress fractures, tendon ruptures etc. He has no such findings on the left. Also noteworthy is that the patient had critical illness neuropathy and some of the discomfort may be actual improvement in nerve function I am just not sure. These wounds were initially in the setting of severe critical illness related to COVID-19. He was  put in a standing position. He may have also been on pressors at the point contributing to tissue ischemia. By his description at some point these wounds were grossly necrotic extending proximally up into the Achilles part of his heel. I do not know that I have ever really seen pictures of them like this although they may exist in epic We have ordered Tri layer Oasis. I am trying to stimulate some granulation in these areas. This is of course assuming the MRI is negative for infection 1/27; since the patient was last here he saw Dr. Juleen China of infectious disease. He is planned for vancomycin and ceftriaxone. Prior operative culture grew MSSA. Also ordered baseline lab work. He also ordered arterial studies although the ABIs in our clinic were normal as well as his clinical exam these were normal I do not think he needs to see vascular  surgery. His ABIs at the [facility] were 1.22 in the right triphasic waveforms with a normal TBI of 1.15 on the left ABI of 1.22 with triphasic waveforms and a normal TBI of 1.08. Finally he saw Dr. Amalia Hailey who will follow him in for 2 months. At this point I do not think he felt that he needed a procedure on the right calcaneal bone. Dr. Juleen China is elected for broad-spectrum antibiotic The patient is still having pain in the right heel. He walks with a walker 2/3; wounds are generally smaller. He is tolerating his IV antibiotics. I believe this is vancomycin and ceftriaxone. We are still waiting for Oasis burn in terms of his out-of-pocket max which he should be meeting soon given the IV antibiotics, MRIs etc. I have asked him to check in on this. We are using silver collagen in the meantime the wounds look better 2/10; tolerating IV vancomycin and Rocephin. We are waiting to apply for Oasis. Although I am not really sure where he is in his out-of-pocket max. 2/17 started the first application of Oasis trilayer. Still on antibiotics. The wounds have generally look better. The area on the left has a little more surface slough requiring debridement [id number]; second application of Oasis trilayer. The wound surface granulation is generally look better. The area on the left with undermining laterally I think is come in a bit. 10/08/2020 upon evaluation today patient is here today for [employer] application #3. Fortunately he seems to be doing extremely well with regard to this and we are seeing a lot of new epithelial growth which is great news. Fortunately there is no signs of active infection at this time. 10/16/2020 upon evaluation today patient appears to be doing well with regard to his foot ulcers. Do believe the Oasis has been of benefit for him. I do not see any signs of infection right now which is great news and I think that he has a lot of new epithelial growth which is great to see as well. The patient  is very pleased to hear all of this. I do think we can proceed with the Oasis trilayer #4 today. 3/18; not as much improvement in these areas on his heels that I was hoping. I did reapply trilateral Oasis today the tissue looks healthier but not as much fill in as I was hoping. 3/25; better looking today I think this is come in a bit the tissue looks healthier. Triple layer Oasis reapplied #6 4/1; somewhat better looking definitely better looking surface not as much change in surface area as I was hoping. He may be spending more  time [place] on days then he needs to although he does have heel offloading boots. Triple layer Oasis reapplied #7 4/7; unfortunately apparently [facility] will not approve any further Oasis which is unfortunate since the patient did respond nicely both in terms of the condition of the wound bed as well as surface area. There is still some drainage coming from the wound but not a lot there does not appear to be any infection 4/15; we have been using Hydrofera Blue. He continues to have nice rims of epithelialization on the right greater than the left. The left the epithelialization is coming from the tip of his heel. There is moderate drainage. In this that concerns me about a total contact cast. There is no evidence of infection 4/29; patient has been using Hydrofera Blue with dressing changes. He has no complaints or issues today. 5/5; using Hydrofera Blue. I actually think that he looks marginally better than the last time I saw this 3 weeks ago. There are rims of epithelialization on the left thumb coming from the medial side on the right. Using Hydrofera Blue 5/12; using Hydrofera Blue. These continue to make improvements in surface area. His drainage was not listed as severe I therefore went ahead and put a cast on the left foot. Right foot we will continue to dress his previous 5/16; back for first total contact cast change. He did not tolerate this  particularly well cast injury on the anterior tibia among other issues. Difficulty sleeping. I talked him about this in some detail and afterwards is elected to continue. I told him I would like to have a cast on for 3 weeks to see if this is going to help at all. I think he agreed 5/19; I think the wound is better. There is no tunneling towards his midfoot. The undermining medially also looks better. He has a rim of new skin distally. I think we are making progress here. The area on the left also continues to look somewhat better to me using Hydrofera Blue. He has a list of complaints about the cast but none of them seem serious 5/26; patient presents for 1 week follow-up. He has been using a total contact cast and tolerating this well. Hydrofera Blue is the main dressing used. He [name], [name] ([id number]) [id number] Page 9 of 15 denies signs of infection. 6/2 Hydrofera Blue total contact cast on the left. These were large ulcers that formed in intensive care unit where the patient was recovering from [place]. May have had something to do with being ventilated in an upright positiono Pressors etc. We have been able to get the areas down considerably and a viable surface. There is some epithelialization in both sides. Note made of drainage 6/9; changed to [facility] last time because of drainage. He arrives with better looking surfaces and dimensions on the left than the right. Paradoxically the right actually probes more towards his midfoot the left is largely close down but both of these look improved. Using a total contact cast on the left 6/16; complex wounds on his bilateral plantar heels which were initially pressure injury from a stay in the ICU with COVID. We have been using silver alginate most recently. His dimensions of come in quite dramatically however not recently. We have been putting the left foot in a total contact cast 6/23; complex wounds on the  bilateral plantar heels. I been putting the left in the cast paradoxically the area on the right is  the one that is going towards closure at a faster rate. Quite a bit of drainage on the left. The patient went to see Dr. Amalia Hailey who said he was going to standby for skin grafts. I had actually considered sending him for skin grafts however he would be mandatorily off his feet for a period of weeks to months. I am thinking that the area on the right is going to close on its own the area on the left has been more stubborn even though we have him in a total contact cast 6/30; took him out of a total contact cast last week is the right heel seem to be making better progress than the left where I was placing the cast. We are using silver alginate. Both wounds are smaller right greater than left 7/12; both wounds look as though they are making some progress. We are using silver alginate. Heel offloading boots 7/26; very gradual progress especially on the right. Using silver alginate. He is wearing heel offloading boots 8/18; he continues to close these wounds down very gradually. Using silver alginate. The problem polymen being definitive about this is areas of what appears to be callus around the margins. This is not a 100% of the area but certainly sizable especially on the right 9/1; bilateral plantar feet wounds secondary to prolonged pressure while being ventilated for COVID-19 in an upright position. Essentially pressure ulcers on the bottom of his feet. He is made substantial progress using silver alginate. 9/14; bilateral plantar feet wounds secondary to prolonged pressure. Making progress using silver alginate. 9/29 bilateral plantar feet wounds secondary to prolonged pressure. I changed him to Iodoflex last week. MolecuLight showing reddened blush fluorescence 10/11; patient presents for follow-up. He has no issues or complaints today. He denies signs of infection. He continues to use Iodoflex and  antibiotic ointment to the wound beds. 10/27; 2-week follow-up. No evidence of infection. He has callus and thick dry skin around the wound margins we have been using Iodoflex and Bactroban which was in response to a moderate left MolecuLight reddish blush fluorescence. 11/10; 2-week follow-up. Wound margins again have thick callus however the measurements of the actual wound sites are a lot smaller. Everything looks reasonably healthy here. We have been using Iodoflex He was approved for prime matrix but I have elected to delay this given the improvement in the surface area. Hopefully I will not regret that decision as were getting close to the end of the year in terms of insurance payment 12/8; 2-week follow-up. Wounds are generally smaller in size. These were initially substantial wounds extending into the forefoot all the way into the heel on the bilateral plantar feet. They are now both located on the plantar heel distal aspect both of these have a lot of callus around the wounds I used a #5 curette to remove this on the right and the left also some subcutaneous debris to try and get the wound edges were using Iodoflex. He has heel offloading shoe 12/22; 2-week follow-up. Not really much improvement. He has thick callus around the outer edges of both wounds. I remove this there is some nonviable subcutaneous tissue as well. We have been using Iodoflex. Her intake nurse and myself spontaneously thought of a total contact cast I went back in May. At that time we really were not seeing much of an improvement with a cast although the wound was in a much different situation I would like to retry this in 2 weeks and I  discussed this with the patient 08/12/2021; the patient has had some improvement with the Iodoflex. The the area on the left heel plantar more improved than the right. I had to put him in a total contact cast on the left although I decided to put that off for 2 weeks. I am going to change  his primary dressing to silver collagen. I think in both areas he has had some improvement most of the healing seems to be more proximal in the heel. The wounds are in the mid aspect. A lot of thick callus on the right heel however. 1/19; we are using silver collagen on both plantar heel areas. He has had some improvement today. The left did not require any debridement. He still had some eschar on the right that was debrided but both seem to have contracted. I did not put it total contact cast on him today 2/2 we have been using silver collagen. The area on the right plantar heel has areas that appear to be epithelialized interspersed with dry flaking callus and dry skin. I removed this. This really looks better than on the other side. On the left still a large area with raised edges and debris on the surface. 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 He has no issues or complaints today. 2/9; not much change in the left foot wound with 1 week of a cast we are using silver collagen. Silver collagen on the right side. The right side has been the better wound surface. We will reapply the total contact cast on the left 2/16; not much improvement on either side I been using silver collagen with a total contact cast on the left. I'm changing the Hydrofera Blue still with a total contact cast on the left 2/23; some improvement on both sides. Disappointing that he has thick callus around the area that we are putting in a total contact cast on the left. We've been using Hydrofera Blue on both wound areas. This is a man who at essentially pressure ulcers in addition to ischemia caused by medications to support his blood pressure (pressors) in the ICU. He was being ventilated in the standing position for severe Covid. A Shiley the wounds extended across his entire foot but are  now localized to his plantar heels bilaterally. We have made progress however neither areas healed. I continue to think the total contact cast is helped albeit painstakingly slowly. He has never wanted a plastic surgery consult although I don't know that they would be interested in grafting in area in this location. 10/07/2021: Continued improvement bilaterally. There is still some callus around the left wound, despite the total contact cast. He has some increased pain in his right midfoot around 1 particular area. This has been painful in the past but seems to be a little bit worse. When his cast was removed today, there was an area on the heel of the left foot that looks a bit macerated. He is also complaining of pain in his left thigh and hip which he thinks is secondary to the limb length discrepancy caused by the cast. 10/14/2021: He continues to improve. A little bit less callus around the left wound. He continues to endorse pain in his right midfoot, but this is not as significant as it was last week. The maceration on his left heel is improved. 10/21/2021:  Continued improvement to both wounds. The maceration on his left heel is no longer evident. Less callus bilaterally. Epithelialization progressing. 10/28/2021: Significant improvement this week. The right sided wound is nearly closed with just a small open area at the middle. No maceration seen on the left heel. Continued epithelialization on both sides. No concern for infection. 11/04/2021: T oday, the wounds were measured a little bit differently and come out as larger, but I actually think they are about the same to potentially even smaller, particularly on the left. He continues to accumulate some callus on the right. 11/11/2021: T oday, the patient is expressing some concern that the left wound, despite being in the total contact cast, is not progressing at the same rate as the 1 on the right. He is interested in trying a week without the cast  to see how the wound does. The wounds are roughly the same size as last week, with the right perhaps being a little bit smaller. He continues to build up callus on both sites. 11/18/2021: Last week, I permitted the patient to go without his total contact cast, just to see if the cast was really making any difference. Today, both wounds have deteriorated to some extent, suggesting that the cast is providing benefit, at least on the left. Both are larger and have accumulated callus, slough, and other debris. [name], [name] ([id number]) [id number] Page 10 of 15 11/26/2021: I debrided both wounds quite aggressively last week in an effort to stimulate the healing cascade. This appears to have been effective as the left sided wound is a full centimeter shorter in length. Although the right was measured slightly larger, on inspection, it looks as though an area of epithelialized tissue was included in the measurements. We have been using PolyMem Ag on the wound surfaces with a total contact cast to the left. 12/02/2021: It appears that the intake personnel are including epithelialized tissue in his wound measurements; the right wound is almost completely epithelialized; there is just a crater at the proximal midfoot with some open areas. On the left, he has built up some callus, but the overall wound surface looks good. There is some senescent skin around the wound margin. He has been in PolyMem Ag bilaterally with a total contact cast on the left. 12/09/2021: The right wound is nearly closed; there is just a small open area at the mid calcaneus. On the left, the wound is smaller with minimal callus buildup. No significant drainage. 12/16/2021: The right calcaneal wound remains minimally open at the mid calcaneus; the rest has epithelialized. On the left, the wound is also a little bit smaller. There is some senescent tissue on the periphery. He is getting his first application of a  trial skin substitute called Vendaje today. 12/23/2021: The wound on his right calcaneus is nearly closed; there is just a small area at the most distal aspect of the calcaneus that is open. On the left, the area where we applied to the skin substitute has a healthier-looking bed of granulation tissue. The wound dimensions are not significantly different on this side but the wound surface is improved. 12/30/2021: The wound on the right calcaneus has not changed significantly aside from some accumulation of callus. On the left, the open area is smaller and continues to have an improved surface. He continues to accumulate callus around the wound. He is here for his third application of Vendaje. 01/06/2022: The right calcaneal wound is down to just a couple of millimeters. He  continues to accumulate periwound callus. He unfortunately got his cast wet earlier in the week and his left foot is macerated, resulting in some superficial skin loss just distal to the open wound. The open wound itself, however, is much smaller and has a healthier appearing surface. He is here for his fourth application of Vendaje. 01/13/2022: The right calcaneal wound is about the same. Unfortunately, once again, his cast got wet and his foot is again macerated. This is caused the left calcaneal wound to enlarge. He is here for his fifth application of Vendaje. 01/20/2022: The right calcaneal wound is very small. There is some periwound callus accumulation. He purchased a new cast protector last week and this has been effective in avoiding the maceration that has been occurring on the left. The left calcaneal wound is narrower and has a healthy and viable-appearing surface. He is here for his 6 application of Vendaje. 01/27/2022: The right calcaneal wound is down to just a pinhole. There is some periwound slough and callus. On the left, the wound is narrower and shorter by about a centimeter. The surface is robust and viable-appearing.  Unfortunately, the rep for the trial skin substitute product did not provide any for [place] to use today. 02/04/2022: The right calcaneal wound remains unchanged. There is more accumulated callus. On the left, although the intake nurse measured it a little bit longer, it looks about the same to me. The surface has a layer of slough, but underneath this, there is good granulation tissue. 02/10/2022: The right calcaneus wound is nearly closed. There is still some callus that builds up around the site. The left side looks about the same in terms of dimensions, but the surface is more robust and vital-appearing. 02/16/2022: The area of the right calcaneus that was nearly closed last week has closed, but there is a small opening at the mid foot where it looks like some moisture got retained and caused some reopening. The left foot wound is narrower and shallower. Both sites have a fair amount of periwound callus and eschar. 02/24/2022: The small midfoot opening on the right calcaneus is a little bit smaller today. The left foot wound is narrower and shallower. He continues to accumulate periwound callus. No concern for infection. 03/01/2022: The patient came to clinic early because he showered and got his cast wet. Fortunately, there is no significant maceration to his foot but the callus softened and it looks like the wound on his left calcaneus may be a little bit wider. The wound on his right calcaneus is just a narrow slit. Continued accumulation of periwound callus bilaterally. 03/08/2022: The wound on his right calcaneus is very nearly closed, just a small pinpoint opening under a bit of eschar; the left wound has come in quite a bit, as well. It is narrower and shorter than at our last visit. Still with accumulated callus and eschar bilaterally. 03/17/2022: The right calcaneal wound is healed. The left wound is smaller and the surface itself is very clean, but there is some blue-green staining on the periwound  callus, concerning for Pseudomonas aeruginosa. 03/23/2022: The right calcaneal wound remains closed. The left wound continues to contract. No further blue-green staining. Small amount of callus and slough accumulation. 03/28/2022: He came in early today because he had gotten his cast wet. On inspection, the wound itself did not get wet or macerated, just a little bit of the forefoot. The wound itself is basically unchanged. 04/07/2022: The right foot wound remains closed. The left  wound is the smallest that I have seen it to date. It is narrower and shorter. It still continues to accumulate slough on the surface. 04/15/2022: There is a band of epithelium now dividing the small left plantar foot wound in 2. There is still some slough on the surface. 04/21/2022: The wound continues to narrow. Just a little bit of slough on the surface. He seems to be responding well to endoform. 04/28/2022: Continued slow contraction of the wound. There is a little slough on the surface and some periwound callus. We have been using endoform and total contact cast. 05/05/2022: The wound appears to have stalled. There is slough and some periwound eschar/callus. No concern for infection, however. 05/12/2022: Unfortunately, his right foot has reopened. It is located at the most posterior aspect of his surgical incision. The area was noted to have drainage coming from it when his padding was removed today. Underneath some callus and senescent skin, there is an opening. No purulent drainage or malodor. On the left foot, the wound is again unchanged. There is some light blue staining on the callus, but no malodor or purulent drainage. 10/13; right and left heel remanence of extensive plantar foot wounds. These are better than I remember by quite a big margin however he is still left with wounds on the left plantar heel and the right plantar heel. Been using endoform bilaterally. A culture was done that showed apparently Pseudomonas  but we are still waiting for the [facility] antibiotic to use gentamicin today. He is still very active by description I am not sure about the offloading of his noncasted right foot 10/20; both wounds right and left heel debrided not much change from last week. [employer] has arrived which is linezolid, gentamicin and ciprofloxacin we will use this with endoform. T contact cast on the left otal 06/02/2022: Both wounds are smaller today. There is still a fair amount of callus buildup around the right foot ulcer. The left is more superficial and nearly flush with the surrounding tissues. Also with slough and eschar buildup. 06/10/2022: The right sided wound appears to be nearly closed, if not completely so, although it is somewhat difficult to tell given the abnormal tissue and scarring in his foot. There is a fair amount of callus and crust accumulation. On the left, the wound looks about the same, again with callus and slough. He has [name], [name] ([id number]) [id number] Page 11 of 15 an appointment next Thursday with Dr. Loel Lofty in podiatry; I am hopeful that there may be some reconstructive options available for Mr. Stmarie. 06/16/2022: Both wounds have some eschar and callus accumulation. The right sided wound is extremely narrow and barely open; the left is narrower than last week. There is a little bit of slough. He has his appointment in podiatry later today. 06/23/2022: The patient met with Dr. Loel Lofty last week and unfortunately, there are no reconstructive options that he believes would be helpful. He did order an MRI to evaluate for osteomyelitis and fortunately, none was seen. The left sided wound is a little bit shorter and narrower today. The right sided wound is about the same. There is callus and eschar accumulation bilaterally. 06/29/2022: Both feet have improved from last week. There is epithelium making a valiant effort to creep across the surface on  the left. The right side looks like it got a little dry and the deep crevasse in his midfoot has cracked. Both have eschar and there is some slough on the left. 07/07/2022: Both  feet have improved. There is epithelium completely covering the calcaneus on the right with just a small opening in the crevasse in his midfoot. On the left, the open area of tissue is smaller but he continues to build up callus/eschar and slough. 07/15/2022: The opening in the midfoot on the right is about the same size, covered with eschar and a little bit of slough. The open portion of the left wound is narrower and shorter with a bit of slough buildup. He admits to being on his feet more than recommended. 12/14; as far as I can tell everything on the right foot is closed. There is some eschar I removed some of this I cannot identify any open wound here. As usual this will be a very vulnerable area going forward. On the left this looks really quite healthy. I was pleasantly surprised to see how good this looked. Wound is certainly smaller and there appears to be healthy epithelialization. He has been using Promogran on the right and endoform on the left. He has been offloading the right foot with a heel offloading boot and he has a running shoe on the right foot 08/04/2022: The right foot remains closed. He has a thick cushioned insole in his sneaker. The left sided wound is smaller with just some slough and eschar accumulation. He is wearing the heel off loader on this foot. 08/15/2022: The right foot remains closed. The left wound has narrowed further. There is some slough and eschar accumulation. 08/25/2022: We put him in a peg assist shoe insert and as a result, he has more epithelialization of the ulcer with minimal slough and eschar accumulation. 09/01/2022: The wound is smaller by about half this week. Still with some slough on the surface. The peg assist shoe seems to be doing a remarkable job of adequately offloading  the site. 09/08/2022: There is a little bit more epithelium coming in. There is some slough and callus buildup. 09/16/2022: The wound measures about the same size, but the epithelium that has grown in looks more robust and stronger. There is some slough and callus buildup. 09/23/2022: The wound remains about the same size. The skin edges are looking rather senescent. 09/29/2022: The aggressive debridement I performed last week seems to have been effective. The wound is smaller and has significantly less slough accumulation. 10/06/2022: There has been quite a bit of epithelialization since last week. There are still some open areas with slough accumulation. There is some callus buildup around the perimeter. 10/13/2022: Continued improvement. Minimal callus accumulation. 3/14; patient presents for follow-up. He has been using endoform to the wound bed without issues. He is using a surgical shoe with peg assist for offloading. 10/27/2022: The wound dimensions are stable. There is some senescent skin accumulation around the perimeter. Patient History Information obtained from Patient. Family History Cancer - Maternal Grandparents, Diabetes - Father,Paternal Grandparents, Heart Disease - Maternal Grandparents, Hypertension - Father,Paternal Grandparents, Lung Disease - Siblings, No family history of Hereditary Spherocytosis, Kidney Disease, Seizures, Stroke, Thyroid Problems, Tuberculosis. Social History Never smoker, Marital Status - Married, Alcohol Use - Never, Drug Use - No History, Caffeine Use - Daily - tea, soda. Medical History Eyes Denies history of Cataracts, Glaucoma, Optic Neuritis Ear/Nose/Mouth/Throat Denies history of Chronic sinus problems/congestion, Middle ear problems Hematologic/Lymphatic Denies history of Anemia, Hemophilia, Human Immunodeficiency Virus, Lymphedema, Sickle Cell Disease Respiratory Patient has history of Asthma Denies history of Aspiration, Chronic Obstructive  Pulmonary Disease (COPD), Pneumothorax, Sleep Apnea, Tuberculosis Cardiovascular Patient has history of Angina -  with COVID, Hypertension Denies history of Arrhythmia, Congestive Heart Failure, Coronary Artery Disease, Deep Vein Thrombosis, Hypotension, Myocardial Infarction, Peripheral Arterial Disease, Peripheral Venous Disease, Phlebitis, Vasculitis Gastrointestinal Denies history of Cirrhosis , Colitis, Crohnoos, Hepatitis A, Hepatitis B, Hepatitis C Endocrine Denies history of Type I Diabetes, Type II Diabetes Genitourinary Denies history of End Stage Renal Disease Immunological Denies history of Lupus Erythematosus, Raynaudoos, Scleroderma Integumentary (Skin) Denies history of History of Burn Musculoskeletal Denies history of Gout, Rheumatoid Arthritis, Osteoarthritis, Osteomyelitis [name], [name] ([id number] Page 12 of 15 Neurologic Denies history of Dementia, Neuropathy, Quadriplegia, Paraplegia, Seizure Disorder Oncologic Denies history of Received Chemotherapy, Received Radiation Psychiatric Denies history of Anorexia/bulimia, Confinement Anxiety Hospitalization/Surgery History - COVID PNA 07/22/2019- 11/14/2019. - 03/27/2020 wound debridement/ skin graft. Medical A Surgical History Notes nd Constitutional Symptoms (General Health) COVID PNA 07/22/2019-11/14/2019 [facility] ECMO, foot drop left foot , Genitourinary kidney stone Psychiatric anxiety Objective Constitutional no acute distress. Vitals Time Taken: 9:54 AM, Height: 69 in, Weight: 280 lbs, BMI: 41.3, Temperature: 98.0 F, Pulse: 80 bpm, Respiratory Rate: 20 breaths/min, Blood Pressure: 114/83 mmHg. Respiratory Normal work of breathing on room air. General Notes: 10/27/2022: The wound dimensions are stable. There is some senescent skin accumulation around the perimeter. Integumentary (Hair, Skin) Wound #2 status is Open. Original cause of wound was Pressure Injury. The  date acquired was: 10/07/2019. The wound has been in treatment 119 weeks. The wound is located on the Left Calcaneus. The wound measures 0.4cm length x 0.2cm width x 0.1cm depth; 0.063cm^2 area and 0.006cm^3 volume. There is Fat Layer (Subcutaneous Tissue) exposed. There is no tunneling or undermining noted. There is a small amount of serosanguineous drainage noted. The wound margin is thickened. There is large (67-100%) pink granulation within the wound bed. There is a small (1-33%) amount of necrotic tissue within the wound bed including Eschar and Adherent Slough. The periwound skin appearance had no abnormalities noted for moisture. The periwound skin appearance had no abnormalities noted for color. The periwound skin appearance exhibited: Callus, Scarring. Periwound temperature was noted as No Abnormality. Assessment Active Problems ICD-10 Non-pressure chronic ulcer of other part of left foot with fat layer exposed Procedures Wound #2 Pre-procedure diagnosis of Wound #2 is a Pressure Ulcer located on the Left Calcaneus . There was a Excisional Skin/Subcutaneous Tissue Debridement with a total area of 0.08 sq cm performed by Fredirick Maudlin, MD. With the following instrument(s): Curette to remove Non-Viable tissue/material. Material removed includes Callus, Subcutaneous Tissue, Slough, Skin: Epidermis, and Other: skin after achieving pain control using Lidocaine 5% topical ointment. No specimens were taken. A time out was conducted at 10:35, prior to the start of the procedure. A Minimum amount of bleeding was controlled with Pressure. The procedure was tolerated well with a pain level of 0 throughout and a pain level of 0 following the procedure. Post Debridement Measurements: 0.4cm length x 0.2cm width x 0.1cm depth; 0.006cm^3 volume. Post debridement Stage noted as Category/Stage III. Character of Wound/Ulcer Post Debridement is improved. Post procedure Diagnosis Wound #2: Same as  Pre-Procedure General Notes: Scribed for Dr. [name] by J.Scotton. Plan Follow-up Appointments: Return Appointment in 1 week. - Dr. [name] Room 3 Anesthetic: [name], [name] ([id number]) [id number] Page 13 of 15 Wound #2 Left Calcaneus: (In clinic) Topical Lidocaine 4% applied to wound bed - In clinic Bathing/ Shower/ Hygiene: May shower and wash wound with soap and water. Edema Control - Lymphedema / SCD / Other: Avoid standing for long periods of time. Moisturize  legs daily. - as needed Off-Loading: Other: - keep pressure off of the bottom of your feet. Elevate legs throughout the day. Use the Shoe with the PegAssist off-loading insole Additional Orders / Instructions: Follow Nutritious Diet - Try to get 70-100 grams of Protein a day+ WOUND #2: - Calcaneus Wound Laterality: Left Cleanser: Normal Saline (Generic) Every Other Day/30 Days Discharge Instructions: Cleanse the wound with Normal Saline prior to applying a clean dressing using gauze sponges, not tissue or cotton balls. Cleanser: Wound Cleanser (Generic) Every Other Day/30 Days Discharge Instructions: Cleanse the wound with wound cleanser prior to applying a clean dressing using gauze sponges, not tissue or cotton balls. Topical: compounding topical antibiotics from [name] Every Other Day/30 Days Discharge Instructions: apply directly to wound bed. (KEYSTONE) Prim Dressing: Endoform 2x2 in (Generic) Every Other Day/30 Days ary Discharge Instructions: Moisten with saline or Hydrogel Secondary Dressing: Optifoam Non-Adhesive Dressing, 4x4 in (Generic) Every Other Day/30 Days Discharge Instructions: Apply over primary dressing as directed. Secondary Dressing: Woven Gauze Sponge, Non-Sterile 4x4 in (Generic) Every Other Day/30 Days Discharge Instructions: Apply over primary dressing as directed. Secured With: 34M Medipore H Soft Cloth Surgical T ape, 4 x 10 (in/yd) (Generic) Every Other  Day/30 Days Discharge Instructions: Secure with tape as directed. Com pression Wrap: Kerlix Roll 4.5x3.1 (in/yd) Every Other Day/30 Days Discharge Instructions: Apply Kerlix and Coban compression as directed. 10/27/2022: The wound dimensions are stable. There is some senescent skin accumulation around the perimeter. I elected to perform an aggressive debridement to try and stimulate the healing cascade. He has responded well to this in the past. I used a curette to debride slough, subcutaneous tissue, and periwound senescent skin. We will continue to use his Keystone topical antibiotic compound (prescription drug) along with endoform. He will continue to use the peg assist for offloading. Follow-up in 1 week. Electronic Signature(s) Signed: 10/27/2022 11:09:04 AM By: Fredirick Maudlin MD FACS Entered By: Fredirick Maudlin on 10/27/2022 11:09:03 -------------------------------------------------------------------------------- HxROS Details Patient Name: Date of Service: [name], [place] 10/27/2022 10:15 A M Medical Record Number: [id number] Patient Account Number: [id number] Date of Birth/Sex: Treating RN: 1976/03/30 (47 y.o. M) Primary Care Provider: [name] Other Clinician: Referring Provider: Treating Provider/Extender: [name] in Treatment: [place] From Patient Constitutional Symptoms (General Health) Medical History: Past Medical History Notes: COVID PNA 07/22/2019-11/14/2019 [facility] ECMO, foot drop left foot , Eyes Medical History: Negative for: Cataracts; Glaucoma; Optic Neuritis Ear/Nose/Mouth/Throat Medical History: Negative for: Chronic sinus problems/congestion; Middle ear problems Hematologic/Lymphatic Medical History: Negative for: Anemia; Hemophilia; Human Immunodeficiency Virus; Lymphedema; Sickle Cell Disease Respiratory FELTON, GARNET ([id number]) [phone].pdf Page 14 of 15 Medical  History: Positive for: Asthma Negative for: Aspiration; Chronic Obstructive Pulmonary Disease (COPD); Pneumothorax; Sleep Apnea; Tuberculosis Cardiovascular Medical History: Positive for: Angina - with COVID; Hypertension Negative for: Arrhythmia; Congestive Heart Failure; Coronary Artery Disease; Deep Vein Thrombosis; Hypotension; Myocardial Infarction; Peripheral Arterial Disease; Peripheral Venous Disease; Phlebitis; Vasculitis Gastrointestinal Medical History: Negative for: Cirrhosis ; Colitis; Crohns; Hepatitis A; Hepatitis B; Hepatitis C Endocrine Medical History: Negative for: Type I Diabetes; Type II Diabetes Genitourinary Medical History: Negative for: End Stage Renal Disease Past Medical History Notes: kidney stone Immunological Medical History: Negative for: Lupus Erythematosus; Raynauds; Scleroderma Integumentary (Skin) Medical History: Negative for: History of Burn Musculoskeletal Medical History: Negative for: Gout; Rheumatoid Arthritis; Osteoarthritis; Osteomyelitis Neurologic Medical History: Negative for: Dementia; Neuropathy; Quadriplegia; Paraplegia; Seizure Disorder Oncologic Medical History: Negative for: Received Chemotherapy; Received Radiation Psychiatric Medical  History: Negative for: Anorexia/bulimia; Confinement Anxiety Past Medical History Notes: anxiety Immunizations Pneumococcal Vaccine: Received Pneumococcal Vaccination: No Implantable Devices None Hospitalization / Surgery History Type of Hospitalization/Surgery COVID PNA 07/22/2019- 11/14/2019 03/27/2020 wound debridement/ skin graft Family and Social History Cancer: Yes - Maternal Grandparents; Diabetes: Yes - Father,Paternal Grandparents; Heart Disease: Yes - Maternal Grandparents; Hereditary Spherocytosis: No; Hypertension: Yes - Father,Paternal Grandparents; Kidney Disease: No; Lung Disease: Yes - Siblings; Seizures: No; Stroke: No; Thyroid Problems: No; Tuberculosis: No; Never  smoker; Marital Status - Married; Alcohol Use: Never; Drug Use: No History; Caffeine Use: Daily - tea, soda; Financial Concerns: No; Food, Clothing or Shelter Needs: No; Support System Lacking: No; Transportation Concerns: No LONNE, VANDEMARK ([id number]) [id number] Page 15 of 15 Electronic Signature(s) Signed: 10/27/2022 12:41:48 PM By: Fredirick Maudlin MD FACS Entered By: Fredirick Maudlin on 10/27/2022 11:07:04 -------------------------------------------------------------------------------- SuperBill Details Patient Name: Date of Service: [name], [place] 10/27/2022 Medical Record Number: [id number] Patient Account Number: [id number] Date of Birth/Sex: Treating RN: 20-Aug-1975 (47 y.o. M) Primary Care Provider: [name] Other Clinician: Referring Provider: Treating Provider/Extender: [name] in Treatment: 119 Diagnosis Coding ICD-10 Codes Code Description [phone] Non-pressure chronic ulcer of other part of left foot with fat layer exposed Facility Procedures : CPT4 Code: [id number] Description: [place] TISSUE 20 SQ CM/< ICD-10 Diagnosis Description L97.522 Non-pressure chronic ulcer of other part of left foot with fat layer exposed Modifier: Quantity: 1 Physician Procedures : CPT4 Code Description Modifier [id number] - WC PHYS LEVEL 4 - EST PT 25 ICD-10 Diagnosis Description L97.522 Non-pressure chronic ulcer of other part of left foot with fat layer exposed Quantity: 1 : [id number] - WC PHYS SUBQ TISS 20 SQ CM ICD-10 Diagnosis Description L97.522 Non-pressure chronic ulcer of other part of left foot with fat layer exposed Quantity: 1 Electronic Signature(s) Signed: 10/27/2022 11:13:05 AM By: Fredirick Maudlin MD FACS Entered By: Fredirick Maudlin on 10/27/2022 11:13:04

## 2022-10-29 NOTE — Progress Notes (Signed)
KOBI, PLY ([id number] Page 1 of 6 Visit Report for 10/27/2022 Arrival Information Details Patient Name: Date of Service: [name], [name] [place] E. 10/27/2022 10:15 A M Medical Record Number: [id number] Patient Account Number: [id number] Date of Birth/Sex: Treating RN: 08-24-1975 (47 y.o. Male) Primary Care Seira Cody: [name] Other Clinician: Referring Somara Frymire: Treating Caralee Morea/Extender: [name] in Treatment: 28 Visit Information History Since Last Visit All ordered tests and consults were completed: No Patient Arrived: Wheel Chair Added or deleted any medications: No Arrival Time: 09:54 Any new allergies or adverse reactions: No Accompanied By: spouse Had a fall or experienced change in No Transfer Assistance: None activities of daily living that may affect Patient Identification Verified: Yes risk of falls: Secondary Verification Process Completed: Yes Signs or symptoms of abuse/neglect since last visito No Patient Requires Transmission-Based Precautions: No Hospitalized since last visit: No Patient Has Alerts: No Implantable device outside of the clinic excluding No cellular tissue based products placed in the center since last visit: Pain Present Now: No Electronic Signature(s) Signed: 10/27/2022 10:16:34 AM By: Worthy Rancher Entered By: Worthy Rancher on 10/27/2022 09:54:54 -------------------------------------------------------------------------------- Encounter Discharge Information Details Patient Name: Date of Service: [name], [name] E. 10/27/2022 10:15 A M Medical Record Number: [id number] Patient Account Number: [id number] Date of Birth/Sex: Treating RN: Oct 10, 1975 (47 y.o. Male) [name] Primary Care Braidyn Scorsone: [name] Other Clinician: Referring Macrae Wiegman: Treating Suprena Travaglini/Extender: [name] in Treatment: 119 Encounter Discharge Information Items Post Procedure  Vitals Discharge Condition: Stable Temperature (F): 98 Ambulatory Status: Wheelchair Pulse (bpm): 80 Discharge Destination: Home Respiratory Rate (breaths/min): 20 Transportation: Private Auto Blood Pressure (mmHg): 114/83 Accompanied By: spouse Schedule Follow-up Appointment: Yes Clinical Summary of Care: Patient Declined Electronic Signature(s) Signed: 10/27/2022 6:37:59 PM By: [name] Entered By: [name] on 10/27/2022 18:29:49 -------------------------------------------------------------------------------- Lower Extremity Assessment Details Patient Name: Date of Service: [name], [name] E. 10/27/2022 10:15 A M Medical Record Number: [id number] Patient Account Number: [id number] Date of Birth/Sex: Treating RN: August 03, 1976 (46 y.o. Male) [name] Primary Care Sabrina Keough: [name] Other Clinician: Referring [name] Deems: Treating Elgin Carn/Extender: [name] in Treatment: 119 Edema Assessment Assessed: [name]: No] [name]: No] G[LeftSalvadore Oxford [id number] Page 2 of 6] Edema: [Left: N] [Right: o] Calf Left: Right: Point of Measurement: 29 cm From Medial Instep 44 cm Ankle Left: Right: Point of Measurement: 9 cm From Medial Instep 24 cm Vascular Assessment Pulses: Dorsalis Pedis Palpable: [Left:Yes] Electronic Signature(s) Signed: 10/27/2022 6:37:59 PM By: [name] Entered By: [name] on 10/27/2022 10:20:33 -------------------------------------------------------------------------------- Multi Wound Chart Details Patient Name: Date of Service: [name], [name] [place] E. 10/27/2022 10:15 A M Medical Record Number: [id number] Patient Account Number: [id number] Date of Birth/Sex: Treating RN: [birth year] (47 y.o. Male) Primary Care Anslie Spadafora: [name] Other Clinician: Referring Anden Bartolo: Treating Strummer Canipe/Extender: [name] in Treatment:  119 Vital Signs Height(in): 69 Pulse(bpm): 80 Weight(lbs): 280 Blood Pressure(mmHg): 114/83 Body Mass Index(BMI): 41.3 Temperature(F): 98.0 Respiratory Rate(breaths/min): 20 [2:Photos:] [N/A:N/A] Left Calcaneus N/A N/A Wound Location: Pressure Injury N/A N/A Wounding Event: Pressure Ulcer N/A N/A Primary Etiology: Asthma, Angina, Hypertension N/A N/A Comorbid History: 10/07/2019 N/A N/A Date Acquired: 119 N/A N/A Weeks of Treatment: Open N/A N/A Wound Status: No N/A N/A Wound Recurrence: 0.4x0.2x0.1 N/A N/A Measurements L x W x D (cm) 0.063 N/A N/A A (cm) : rea 0.006 N/A N/A Volume (cm) : 99.80% N/A N/A % Reduction in A  rea: 100.00% N/A N/A % Reduction in Volume: Category/Stage III N/A N/A Classification: Small N/A N/A Exudate A mount: Serosanguineous N/A N/A Exudate Type: red, brown N/A N/A Exudate Color: Thickened N/A N/A Wound Margin: Large (67-100%) N/A N/A Granulation A mount: Pink N/A N/A Granulation Quality: Small (1-33%) N/A N/A Necrotic A mount: Eschar, Adherent Slough N/A N/A Necrotic Tissue: Fat Layer (Subcutaneous Tissue): Yes N/A N/A Exposed Structures: Fascia: No Tendon: No [name], [name] ([id number] Page 3 of 6 Muscle: No Joint: No Bone: No Large (67-100%) N/A N/A Epithelialization: Debridement - Excisional N/A N/A Debridement: Pre-procedure Verification/Time Out 10:35 N/A N/A Taken: Lidocaine 5% topical ointment N/A N/A Pain Control: Other, Callus, Subcutaneous, Slough N/A N/A Tissue Debrided: Skin/Subcutaneous Tissue N/A N/A Level: 0.08 N/A N/A Debridement A (sq cm): rea Curette N/A N/A Instrument: Minimum N/A N/A Bleeding: Pressure N/A N/A Hemostasis A chieved: 0 N/A N/A Procedural Pain: 0 N/A N/A Post Procedural Pain: Procedure was tolerated well N/A N/A Debridement Treatment Response: 0.4x0.2x0.1 N/A N/A Post Debridement Measurements L x W x D (cm) 0.006 N/A  N/A Post Debridement Volume: (cm) Category/Stage III N/A N/A Post Debridement Stage: Callus: Yes N/A N/A Periwound Skin Texture: Scarring: Yes Maceration: Yes N/A N/A Periwound Skin Moisture: No Abnormalities Noted N/A N/A Periwound Skin Color: No Abnormality N/A N/A Temperature: Debridement N/A N/A Procedures Performed: Treatment Notes Electronic Signature(s) Signed: 10/27/2022 11:04:19 AM By: Fredirick Maudlin MD FACS Entered By: Fredirick Maudlin on 10/27/2022 11:04:18 -------------------------------------------------------------------------------- Multi-Disciplinary Care Plan Details Patient Name: Date of Service: [name], [name] [place] E. 10/27/2022 10:15 A M Medical Record Number: [id number] Patient Account Number: [id number] Date of Birth/Sex: Treating RN: 10-Jan-1976 (47 y.o. Male) [name] Primary Care Syvilla Martin: [name] Other Clinician: Referring Shilynn Hoch: Treating Jaeger Trueheart/Extender: [name] in Treatment: [place] reviewed with physician Active Inactive Wound/Skin Impairment Nursing Diagnoses: Impaired tissue integrity Knowledge deficit related [name] ulceration/compromised skin integrity Goals: Patient/caregiver will verbalize understanding of skin care regimen Date Initiated: 07/15/2020 Target Resolution Date: 03/08/2023 Goal Status: Active Ulcer/skin breakdown will have a volume reduction of 30% by week 4 Date Initiated: 07/15/2020 Date Inactivated: 08/20/2020 Target Resolution Date: 09/03/2020 Goal Status: Unmet Unmet Reason: no major changes. Ulcer/skin breakdown will heal within 14 weeks Date Initiated: 12/04/2020 Date Inactivated: 12/10/2020 Target Resolution Date: 12/10/2020 Unmet Reason: wounds still open at 14 Goal Status: Unmet weeks and today 21 weeks. Interventions: Assess patient/caregiver ability [name] obtain necessary supplies Assess patient/caregiver ability [name] perform ulcer/skin care regimen upon  admission and as needed Assess ulceration(s) every visit Provide education on ulcer and skin care Treatment Activities: Skin care regimen initiated : 07/15/2020 TARAY, ALLEN ([id number]) [id number] Page 4 of 6 Topical wound management initiated : 07/15/2020 Notes: Electronic Signature(s) Signed: 10/27/2022 6:37:59 PM By: [name] Entered By: [name] on 10/27/2022 18:28:20 -------------------------------------------------------------------------------- Pain Assessment Details Patient Name: Date of Service: [name], [place] E. 10/27/2022 10:15 A M Medical Record Number: [id number] Patient Account Number: [id number] Date of Birth/Sex: Treating RN: 1975-08-16 (47 y.o. Male) Primary Care Prabhjot Maddux: [name] Other Clinician: Referring Jakevious Hollister: Treating Matisse Salais/Extender: [name] in Treatment: 119 Active Problems Location of Pain Severity and Description of Pain Patient Has Paino Yes Site Locations Rate the pain. Current Pain Level: 4 Worst Pain Level: 10 Least Pain Level: 0 Tolerable Pain Level: 2 Pain Management and Medication Current Pain Management: Electronic Signature(s) Signed: 10/27/2022 10:16:34 AM By: Worthy Rancher Entered By: Worthy Rancher on 10/27/2022 09:55:36 -------------------------------------------------------------------------------- Patient/Caregiver Education  Details Patient Name: Date of Service: [name] 3/21/2024andnbsp10:15 [place] Record Number: [id number] Patient Account Number: [id number] Date of Birth/Gender: Treating RN: Dec 23, 1975 (47 y.o. Male) [name] Primary Care Physician: [name] Other Clinician: Referring Physician: Treating Physician/Extender: [name] in Treatment: 119 Education Assessment Education Provided [name]: Patient Education Topics Provided Wound/Skin Impairment: Methods: Explain/Verbal Responses: Return  demonstration correctly HERSHY, SMOLIK ([id number]) [id number] Page 5 of 6 Electronic Signature(s) Signed: 10/27/2022 6:37:59 PM By: [name] Entered By: [name] on 10/27/2022 18:28:36 -------------------------------------------------------------------------------- Wound Assessment Details Patient Name: Date of Service: [name], [place] E. 10/27/2022 10:15 A M Medical Record Number: [id number] Patient Account Number: [id number] Date of Birth/Sex: Treating RN: [birth year] (47 y.o. Male) Primary Care Chevelle Durr: [name] Other Clinician: Referring Francois Elk: Treating Dian Minahan/Extender: [name] in Treatment: 119 Wound Status Wound Number: 2 Primary Etiology: Pressure Ulcer Wound Location: Left Calcaneus Wound Status: Open Wounding Event: Pressure Injury Comorbid History: Asthma, Angina, Hypertension Date Acquired: 10/07/2019 Weeks Of Treatment: 119 Clustered Wound: No Photos Wound Measurements Length: (cm) 0.4 Width: (cm) 0.2 Depth: (cm) 0.1 Area: (cm) 0.063 Volume: (cm) 0.006 % Reduction in Area: 99.8% % Reduction in Volume: 100% Epithelialization: Large (67-100%) Tunneling: No Undermining: No Wound Description Classification: Category/Stage III Wound Margin: Thickened Exudate Amount: Small Exudate Type: Serosanguineous Exudate Color: red, brown Foul Odor After Cleansing: No Slough/Fibrino No Wound Bed Granulation Amount: Large (67-100%) Exposed Structure Granulation Quality: Pink Fascia Exposed: No Necrotic Amount: Small (1-33%) Fat Layer (Subcutaneous Tissue) Exposed: Yes Necrotic Quality: Eschar, Adherent Slough Tendon Exposed: No Muscle Exposed: No Joint Exposed: No Bone Exposed: No Periwound Skin Texture Texture Color No Abnormalities Noted: No No Abnormalities Noted: Yes Callus: Yes Temperature / Pain Scarring: Yes Temperature: No Abnormality Moisture No Abnormalities Noted:  Yes Treatment Notes OSMAN, CASE ([id number] Page 6 of 6 Wound #2 (Calcaneus) Wound Laterality: Left Cleanser Normal Saline Discharge Instruction: Cleanse the wound with Normal Saline prior [name] applying a clean dressing using gauze sponges, not tissue or cotton balls. Wound Cleanser Discharge Instruction: Cleanse the wound with wound cleanser prior [name] applying a clean dressing using gauze sponges, not tissue or cotton balls. Peri-Wound Care Topical compounding topical antibiotics from [facility] Discharge Instruction: apply directly [name] wound bed. (KEYSTONE) Primary Dressing Endoform 2x2 in Discharge Instruction: Moisten with saline or Hydrogel Secondary Dressing Optifoam Non-Adhesive Dressing, 4x4 in Discharge Instruction: Apply over primary dressing as directed. Woven Gauze Sponge, Non-Sterile 4x4 in Discharge Instruction: Apply over primary dressing as directed. Secured With 25M Medipore H Soft Cloth Surgical T ape, 4 x 10 (in/yd) Discharge Instruction: Secure with tape as directed. Compression Wrap Kerlix Roll 4.5x3.1 (in/yd) Discharge Instruction: Apply Kerlix and Coban compression as directed. Compression Stockings Add-Ons Electronic Signature(s) Signed: 10/27/2022 6:37:59 PM By: [name] Previous Signature: 10/27/2022 10:16:34 AM Version By: Worthy Rancher Entered By: [name] on 10/27/2022 10:33:46 -------------------------------------------------------------------------------- Vitals Details Patient Name: Date of Service: [name], [place] E. 10/27/2022 10:15 A M Medical Record Number: [id number] Patient Account Number: [id number] Date of Birth/Sex: Treating RN: 17-Feb-1976 (47 y.o. Male) Primary Care Lord Lancour: [name] Other Clinician: Referring Robena Ewy: Treating Nessie Nong/Extender: [name] in Treatment: 119 Vital Signs Time Taken: 09:54 Temperature (F): 98.0 Height (in):  69 Pulse (bpm): 80 Weight (lbs): 280 Respiratory Rate (breaths/min): 20 Body Mass Index (BMI): 41.3 Blood Pressure (mmHg): 114/83 Reference Range: 80 - 120 mg / dl Electronic Signature(s) Signed: 10/27/2022  10:16:34 AM By: Worthy Rancher Entered By: Worthy Rancher on 10/27/2022 09:55:17

## 2022-11-03 NOTE — Telephone Encounter (Signed)
Patient has high copay will mail PAP app to patient to try and get Rx through patient assistance

## 2022-11-04 ENCOUNTER — Encounter (HOSPITAL_BASED_OUTPATIENT_CLINIC_OR_DEPARTMENT_OTHER): Payer: PPO | Admitting: General Surgery

## 2022-11-04 DIAGNOSIS — L89623 Pressure ulcer of left heel, stage 3: Secondary | ICD-10-CM | POA: Diagnosis not present

## 2022-11-04 DIAGNOSIS — L97522 Non-pressure chronic ulcer of other part of left foot with fat layer exposed: Secondary | ICD-10-CM | POA: Diagnosis not present

## 2022-11-05 NOTE — Progress Notes (Signed)
[name], [name] ([id number]) [id number] Page 1 of 15 Visit Report for 11/04/2022 Chief Complaint Document Details Patient Name: Date of Service: [name], [name] 11/04/2022 11:00 A M Medical Record Number: [id number] Patient Account Number: [id number] Date of Birth/Sex: Treating RN: [birth year] (47 y.o. M) Primary Care Provider: Cristie [name] Other Clinician: Referring Provider: Treating Provider/Extender: [name] in Treatment: 120 Information Obtained from: Patient Chief Complaint Bilateral Plantar Foot Ulcers Electronic Signature(s) Signed: 11/04/2022 11:31:22 AM By: Fredirick Maudlin MD FACS Entered By: Fredirick Maudlin on 11/04/2022 11:31:22 -------------------------------------------------------------------------------- Debridement Details Patient Name: Date of Service: [name], [name] 11/04/2022 11:00 A M Medical Record Number: [id number] Patient Account Number: [id number] Date of Birth/Sex: Treating RN: [birth year] (47 y.o. Collene [name] Primary Care Provider: Cristie [name] Other Clinician: Referring Provider: Treating Provider/Extender: [name] in Treatment: 120 Debridement Performed for Assessment: Wound #2 Left Calcaneus Performed By: Physician Fredirick Maudlin, MD Debridement Type: Debridement Level of Consciousness (Pre-procedure): Awake and Alert Pre-procedure Verification/Time Out Yes - 11:18 Taken: Start Time: 11:18 Pain Control: Lidocaine 4% T opical Solution T Area Debrided (L x W): otal 1 (cm) x 0.5 (cm) = 0.5 (cm) Tissue and other material debrided: Non-Viable, Slough, Subcutaneous, Slough Level: Skin/Subcutaneous Tissue Debridement Description: Excisional Instrument: Curette Bleeding: Minimum Hemostasis Achieved: Pressure End Time: 11:20 Procedural Pain: 0 Post Procedural Pain: 0 Response to Treatment: Procedure was tolerated well Level of Consciousness (Post- Awake and  Alert procedure): Post Debridement Measurements of Total Wound Length: (cm) 1 Stage: Category/Stage III Width: (cm) 0.5 Depth: (cm) 0.1 Volume: (cm) 0.039 Character of Wound/Ulcer Post Debridement: Improved Post Procedure Diagnosis Same as Pre-procedure Notes Scribed for Dr. Celine [name] by J.Scotton Electronic Signature(s) Signed: 11/04/2022 12:00:48 PM By: Fredirick Maudlin MD FACS Signed: 11/04/2022 4:41:04 PM By: [name] Catholic RN [name] ([id number]) [id number] Page 2 of 15 Entered By: [name] on 11/04/2022 11:26:43 -------------------------------------------------------------------------------- HPI Details Patient Name: Date of Service: [name] Noble 11/04/2022 11:00 A M Medical Record Number: [id number] Patient Account Number: [id number] Date of Birth/Sex: Treating RN: Oct 04, 1975 (47 y.o. M) Primary Care Provider: Cristie [name] Other Clinician: Referring Provider: Treating Provider/Extender: [name] in Treatment: 120 History of Present Illness HPI Description: Wounds are12/03/2020 upon evaluation today patient presents for initial inspection here in our clinic concerning issues he has been having with the bottoms of his feet bilaterally. He states these actually occurred as wounds when he was hospitalized for 5 months secondary to Covid. He was apparently with tilting bed where he was in an upright position quite frequently and apparently this occurred in some way shape or form during that time. Fortunately there is no sign of active infection at this time. No fevers, chills, nausea, vomiting, or diarrhea. With that being said he still has substantial wounds on the plantar aspects of his feet Theragen require quite a bit of work to get these to heal. He has been using Santyl currently though that is been problematic both in receiving the medication as well as actually paid for it as it is become quite expensive.  Prior to the experience with Covid the patient really did not have any major medical problems other than hypertension he does have some mild generalized weakness following the Covid experience. 07/22/2020 on evaluation today patient appears to be doing okay in regard to his foot ulcers I feel like the wound beds are showing signs of better improvement that I do believe the Iodoflex  is helping in this regard. With that being said he does have a lot of drainage currently and this is somewhat blue/green in nature which is consistent with Pseudomonas. I do think a culture today would be appropriate for [place] to evaluate and see if that is indeed the case I would likely start him on antibiotic orally as well he is not allergic to Cipro knows of no issues he has had in the past 12/21; patient was admitted to the clinic earlier this month with bilateral presumed pressure ulcers on the bottom of his feet apparently related to excessive pressure from a tilt table arrangement in the intensive care unit. Patient relates this to being on ECMO but I am not really sure that is exactly related to that. I must say I have never seen anything like this. He has fairly extensive full-thickness wounds extending from his heel towards his midfoot mostly centered laterally. There is already been some healing distally. He does not appear to have an arterial issue. He has been using gentamicin to the wound surfaces with Iodoflex to help with ongoing debridement 1/6; this is a patient with pressure ulcers on the bottom of his feet related to excessive pressure from a standing position in the intensive care unit. He is complaining of a lot of pain in the right heel. He is not a diabetic. 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 We have been using Iodoflex to help prepare the surfaces of both wounds for an advanced treatment product. He is nonambulatory spending most of his time in a wheelchair I have asked  him not to propel the wheelchair with his heels 1/13; in general his wounds look better not much surface area change we have been using Iodoflex as of last week. I did an x-ray of the right heel as the patient was complaining of pain. I had some thoughts about a stress fracture perhaps Achilles tendon problems however what it showed was erosive changes along the inferior aspect of the calcaneus he now has a MRI booked for 1/20. 1/20; in general his wounds continue to be better. Some improvement in the large narrow areas proximally in his foot. He is still complaining of pain in the right heel and tenderness in certain areas of this wound. His MRI is tonight. I am not just looking for osteomyelitis that was brought up on the x-ray I am wondering about stress fractures, tendon ruptures etc. He has no such findings on the left. Also noteworthy is that the patient had critical illness neuropathy and some of the discomfort may be actual improvement in nerve function I am just not sure. These wounds were initially in the setting of severe critical illness related to COVID-19. He was put in a standing position. He may have also been on pressors at the point contributing to tissue ischemia. By his description at some point these wounds were grossly necrotic extending proximally up into the Achilles part of his heel. I do not know that I have ever really seen pictures of them like this although they may exist in epic We have ordered Tri layer Oasis. I am trying to stimulate some granulation in these areas. This is of course assuming the MRI is negative for infection 1/27; since the patient was last here he saw Dr. Earlene Plater of infectious disease. He is planned for vancomycin and ceftriaxone. Prior operative culture grew MSSA. Also ordered baseline lab work. He also ordered arterial studies although the ABIs in our  clinic were normal as well as his clinical exam these were normal I do not think he needs to  see vascular surgery. His ABIs at the PTA were 1.22 in the right triphasic waveforms with a normal TBI of 1.15 on the left ABI of 1.22 with triphasic waveforms and a normal TBI of 1.08. Finally he saw Dr. Amalia Hailey who will follow him in for 2 months. At this point I do not think he felt that he needed a procedure on the right calcaneal bone. Dr. Juleen China is elected for broad-spectrum antibiotic The patient is still having pain in the right heel. He walks with a walker 2/3; wounds are generally smaller. He is tolerating his IV antibiotics. I believe this is vancomycin and ceftriaxone. We are still waiting for Oasis burn in terms of his out-of-pocket max which he should be meeting soon given the IV antibiotics, MRIs etc. I have asked him to check in on this. We are using silver collagen in the meantime the wounds look better 2/10; tolerating IV vancomycin and Rocephin. We are waiting to apply for Oasis. Although I am not really sure where he is in his out-of-pocket max. 2/17 started the first application of Oasis trilayer. Still on antibiotics. The wounds have generally look better. The area on the left has a little more surface slough requiring debridement [id number]; second application of Oasis trilayer. The wound surface granulation is generally look better. The area on the left with undermining laterally I think is come in a bit. 10/08/2020 upon evaluation today patient is here today for [employer] application #3. Fortunately he seems to be doing extremely well with regard to this and we are seeing a lot of new epithelial growth which is great news. Fortunately there is no signs of active infection at this time. 10/16/2020 upon evaluation today patient appears to be doing well with regard to his foot ulcers. Do believe the Oasis has been of benefit for him. I do not see any signs of infection right now which is great news and I think that he has a lot of new epithelial growth which is great to see as well.  The patient is very pleased to hear all of this. I do think we can proceed with the Oasis trilayer #4 today. 3/18; not as much improvement in these areas on his heels that I was hoping. I did reapply trilateral Oasis today the tissue looks healthier but not as much fill in as I was hoping. 3/25; better looking today I think this is come in a bit the tissue looks healthier. Triple layer Oasis reapplied #6 4/1; somewhat better looking definitely better looking surface not as much change in surface area as I was hoping. He may be spending more time [place] on days then he needs to although he does have heel offloading boots. Triple layer Oasis reapplied #7 4/7; unfortunately apparently [facility] will not approve any further Oasis which is unfortunate since the patient did respond nicely both in terms of the condition of the wound bed as well as surface area. There is still some drainage coming from the wound but not a lot there does not appear to be any ENZO, SIMONIN ([id number]) [id number] Page 3 of 15 infection 4/15; we have been using Hydrofera Blue. He continues to have nice rims of epithelialization on the right greater than the left. The left the epithelialization is coming from the tip of his heel. There is moderate drainage. In this that concerns  me about a total contact cast. There is no evidence of infection 4/29; patient has been using Hydrofera Blue with dressing changes. He has no complaints or issues today. 5/5; using Hydrofera Blue. I actually think that he looks marginally better than the last time I saw this 3 weeks ago. There are rims of epithelialization on the left thumb coming from the medial side on the right. Using Hydrofera Blue 5/12; using Hydrofera Blue. These continue to make improvements in surface area. His drainage was not listed as severe I therefore went ahead and put a cast on the left foot. Right foot we will continue to dress  his previous 5/16; back for first total contact cast change. He did not tolerate this particularly well cast injury on the anterior tibia among other issues. Difficulty sleeping. I talked him about this in some detail and afterwards is elected to continue. I told him I would like to have a cast on for 3 weeks to see if this is going to help at all. I think he agreed 5/19; I think the wound is better. There is no tunneling towards his midfoot. The undermining medially also looks better. He has a rim of new skin distally. I think we are making progress here. The area on the left also continues to look somewhat better to me using Hydrofera Blue. He has a list of complaints about the cast but none of them seem serious 5/26; patient presents for 1 week follow-up. He has been using a total contact cast and tolerating this well. Hydrofera Blue is the main dressing used. He denies signs of infection. 6/2 Hydrofera Blue total contact cast on the left. These were large ulcers that formed in intensive care unit where the patient was recovering from [place]. May have had something to do with being ventilated in an upright positiono Pressors etc. We have been able to get the areas down considerably and a viable surface. There is some epithelialization in both sides. Note made of drainage 6/9; changed to [facility] last time because of drainage. He arrives with better looking surfaces and dimensions on the left than the right. Paradoxically the right actually probes more towards his midfoot the left is largely close down but both of these look improved. Using a total contact cast on the left 6/16; complex wounds on his bilateral plantar heels which were initially pressure injury from a stay in the ICU with COVID. We have been using silver alginate most recently. His dimensions of come in quite dramatically however not recently. We have been putting the left foot in a total contact cast 6/23; complex wounds on  the bilateral plantar heels. I been putting the left in the cast paradoxically the area on the right is the one that is going towards closure at a faster rate. Quite a bit of drainage on the left. The patient went to see Dr. Amalia Hailey who said he was going to standby for skin grafts. I had actually considered sending him for skin grafts however he would be mandatorily off his feet for a period of weeks to months. I am thinking that the area on the right is going to close on its own the area on the left has been more stubborn even though we have him in a total contact cast 6/30; took him out of a total contact cast last week is the right heel seem to be making better progress than the left where I was placing the cast. We are using silver  alginate. Both wounds are smaller right greater than left 7/12; both wounds look as though they are making some progress. We are using silver alginate. Heel offloading boots 7/26; very gradual progress especially on the right. Using silver alginate. He is wearing heel offloading boots 8/18; he continues to close these wounds down very gradually. Using silver alginate. The problem polymen being definitive about this is areas of what appears to be callus around the margins. This is not a 100% of the area but certainly sizable especially on the right 9/1; bilateral plantar feet wounds secondary to prolonged pressure while being ventilated for COVID-19 in an upright position. Essentially pressure ulcers on the bottom of his feet. He is made substantial progress using silver alginate. 9/14; bilateral plantar feet wounds secondary to prolonged pressure. Making progress using silver alginate. 9/29 bilateral plantar feet wounds secondary to prolonged pressure. I changed him to Iodoflex last week. MolecuLight showing reddened blush fluorescence 10/11; patient presents for follow-up. He has no issues or complaints today. He denies signs of infection. He continues to use Iodoflex  and antibiotic ointment to the wound beds. 10/27; 2-week follow-up. No evidence of infection. He has callus and thick dry skin around the wound margins we have been using Iodoflex and Bactroban which was in response to a moderate left MolecuLight reddish blush fluorescence. 11/10; 2-week follow-up. Wound margins again have thick callus however the measurements of the actual wound sites are a lot smaller. Everything looks reasonably healthy here. We have been using Iodoflex He was approved for prime matrix but I have elected to delay this given the improvement in the surface area. Hopefully I will not regret that decision as were getting close to the end of the year in terms of insurance payment 12/8; 2-week follow-up. Wounds are generally smaller in size. These were initially substantial wounds extending into the forefoot all the way into the heel on the bilateral plantar feet. They are now both located on the plantar heel distal aspect both of these have a lot of callus around the wounds I used a #5 curette to remove this on the right and the left also some subcutaneous debris to try and get the wound edges were using Iodoflex. He has heel offloading shoe 12/22; 2-week follow-up. Not really much improvement. He has thick callus around the outer edges of both wounds. I remove this there is some nonviable subcutaneous tissue as well. We have been using Iodoflex. Her intake nurse and myself spontaneously thought of a total contact cast I went back in May. At that time we really were not seeing much of an improvement with a cast although the wound was in a much different situation I would like to retry this in 2 weeks and I discussed this with the patient 08/12/2021; the patient has had some improvement with the Iodoflex. The the area on the left heel plantar more improved than the right. I had to put him in a total contact cast on the left although I decided to put that off for 2 weeks. I am going to  change his primary dressing to silver collagen. I think in both areas he has had some improvement most of the healing seems to be more proximal in the heel. The wounds are in the mid aspect. A lot of thick callus on the right heel however. 1/19; we are using silver collagen on both plantar heel areas. He has had some improvement today. The left did not require any debridement. He still had  some eschar on the right that was debrided but both seem to have contracted. I did not put it total contact cast on him today 2/2 we have been using silver collagen. The area on the right plantar heel has areas that appear to be epithelialized interspersed with dry flaking callus and dry skin. I removed this. This really looks better than on the other side. On the left still a large area with raised edges and debris on the surface. 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 He has no issues or complaints today. 2/9; not much change in the left foot wound with 1 week of a cast we are using silver collagen. Silver collagen on the right side. The right side has been the better wound surface. We will reapply the total contact cast on the left 2/16; not much improvement on either side I been using silver collagen with a total contact cast on the left. I'm changing the Hydrofera Blue still with a total contact cast on the left 2/23; some improvement on both sides. Disappointing that he has thick callus around the area that we are putting in a total contact cast on the left. We've been using Hydrofera Blue on both wound areas. This is a man who at essentially pressure ulcers in addition to ischemia caused by medications to support his blood pressure (pressors) in the ICU. He was being ventilated in the standing position for severe Covid. A Shiley the wounds extended across his entire foot but  are now localized to his plantar heels bilaterally. We have made progress however neither areas healed. I continue to think the total contact cast is helped albeit painstakingly slowly. He has never wanted a plastic surgery consult although I don't know that they would be interested in grafting in area in this location. 10/07/2021: Continued improvement bilaterally. There is still some callus around the left wound, despite the total contact cast. He has some increased pain in his right midfoot around 1 particular area. This has been painful in the past but seems to be a little bit worse. When his cast was removed today, there was an area on the heel of the left foot that looks a bit macerated. He is also complaining of pain in his left thigh and hip which he thinks is secondary to the limb length discrepancy caused by the cast. 10/14/2021: He continues to improve. A little bit less callus around the left wound. He continues to endorse pain in his right midfoot, but this is not as significant as it was last week. The maceration on his left heel is improved. 10/21/2021: Continued improvement to both wounds. The maceration on his left heel is no longer evident. Less callus bilaterally. Epithelialization progressing. DERON, PONCE ([id number]) [id number] Page 4 of 15 10/28/2021: Significant improvement this week. The right sided wound is nearly closed with just a small open area at the middle. No maceration seen on the left heel. Continued epithelialization on both sides. No concern for infection. 11/04/2021: T oday, the wounds were measured a little bit differently and come out as larger, but I actually think they are about the same to potentially even smaller, particularly on the left. He continues to accumulate some callus on the right. 11/11/2021: T oday, the patient is expressing some concern that the left wound, despite being in the total contact cast, is  not progressing at the  same rate as the 1 on the right. He is interested in trying a week without the cast to see how the wound does. The wounds are roughly the same size as last week, with the right perhaps being a little bit smaller. He continues to build up callus on both sites. 11/18/2021: Last week, I permitted the patient to go without his total contact cast, just to see if the cast was really making any difference. Today, both wounds have deteriorated to some extent, suggesting that the cast is providing benefit, at least on the left. Both are larger and have accumulated callus, slough, and other debris. 11/26/2021: I debrided both wounds quite aggressively last week in an effort to stimulate the healing cascade. This appears to have been effective as the left sided wound is a full centimeter shorter in length. Although the right was measured slightly larger, on inspection, it looks as though an area of epithelialized tissue was included in the measurements. We have been using PolyMem Ag on the wound surfaces with a total contact cast to the left. 12/02/2021: It appears that the intake personnel are including epithelialized tissue in his wound measurements; the right wound is almost completely epithelialized; there is just a crater at the proximal midfoot with some open areas. On the left, he has built up some callus, but the overall wound surface looks good. There is some senescent skin around the wound margin. He has been in PolyMem Ag bilaterally with a total contact cast on the left. 12/09/2021: The right wound is nearly closed; there is just a small open area at the mid calcaneus. On the left, the wound is smaller with minimal callus buildup. No significant drainage. 12/16/2021: The right calcaneal wound remains minimally open at the mid calcaneus; the rest has epithelialized. On the left, the wound is also a little bit smaller. There is some senescent tissue on the periphery. He is getting his first application of a  trial skin substitute called Vendaje today. 12/23/2021: The wound on his right calcaneus is nearly closed; there is just a small area at the most distal aspect of the calcaneus that is open. On the left, the area where we applied to the skin substitute has a healthier-looking bed of granulation tissue. The wound dimensions are not significantly different on this side but the wound surface is improved. 12/30/2021: The wound on the right calcaneus has not changed significantly aside from some accumulation of callus. On the left, the open area is smaller and continues to have an improved surface. He continues to accumulate callus around the wound. He is here for his third application of Vendaje. 01/06/2022: The right calcaneal wound is down to just a couple of millimeters. He continues to accumulate periwound callus. He unfortunately got his cast wet earlier in the week and his left foot is macerated, resulting in some superficial skin loss just distal to the open wound. The open wound itself, however, is much smaller and has a healthier appearing surface. He is here for his fourth application of Vendaje. 01/13/2022: The right calcaneal wound is about the same. Unfortunately, once again, his cast got wet and his foot is again macerated. This is caused the left calcaneal wound to enlarge. He is here for his fifth application of Vendaje. 01/20/2022: The right calcaneal wound is very small. There is some periwound callus accumulation. He purchased a new cast protector last week and this has been effective in avoiding the maceration that has  been occurring on the left. The left calcaneal wound is narrower and has a healthy and viable-appearing surface. He is here for his 6 application of Vendaje. 01/27/2022: The right calcaneal wound is down to just a pinhole. There is some periwound slough and callus. On the left, the wound is narrower and shorter by about a centimeter. The surface is robust and viable-appearing.  Unfortunately, the rep for the trial skin substitute product did not provide any for [place] to use today. 02/04/2022: The right calcaneal wound remains unchanged. There is more accumulated callus. On the left, although the intake nurse measured it a little bit longer, it looks about the same to me. The surface has a layer of slough, but underneath this, there is good granulation tissue. 02/10/2022: The right calcaneus wound is nearly closed. There is still some callus that builds up around the site. The left side looks about the same in terms of dimensions, but the surface is more robust and vital-appearing. 02/16/2022: The area of the right calcaneus that was nearly closed last week has closed, but there is a small opening at the mid foot where it looks like some moisture got retained and caused some reopening. The left foot wound is narrower and shallower. Both sites have a fair amount of periwound callus and eschar. 02/24/2022: The small midfoot opening on the right calcaneus is a little bit smaller today. The left foot wound is narrower and shallower. He continues to accumulate periwound callus. No concern for infection. 03/01/2022: The patient came to clinic early because he showered and got his cast wet. Fortunately, there is no significant maceration to his foot but the callus softened and it looks like the wound on his left calcaneus may be a little bit wider. The wound on his right calcaneus is just a narrow slit. Continued accumulation of periwound callus bilaterally. 03/08/2022: The wound on his right calcaneus is very nearly closed, just a small pinpoint opening under a bit of eschar; the left wound has come in quite a bit, as well. It is narrower and shorter than at our last visit. Still with accumulated callus and eschar bilaterally. 03/17/2022: The right calcaneal wound is healed. The left wound is smaller and the surface itself is very clean, but there is some blue-green staining on the periwound  callus, concerning for Pseudomonas aeruginosa. 03/23/2022: The right calcaneal wound remains closed. The left wound continues to contract. No further blue-green staining. Small amount of callus and slough accumulation. 03/28/2022: He came in early today because he had gotten his cast wet. On inspection, the wound itself did not get wet or macerated, just a little bit of the forefoot. The wound itself is basically unchanged. 04/07/2022: The right foot wound remains closed. The left wound is the smallest that I have seen it to date. It is narrower and shorter. It still continues to accumulate slough on the surface. 04/15/2022: There is a band of epithelium now dividing the small left plantar foot wound in 2. There is still some slough on the surface. 04/21/2022: The wound continues to narrow. Just a little bit of slough on the surface. He seems to be responding well to endoform. 04/28/2022: Continued slow contraction of the wound. There is a little slough on the surface and some periwound callus. We have been using endoform and total contact cast. 05/05/2022: The wound appears to have stalled. There is slough and some periwound eschar/callus. No concern for infection, however. 05/12/2022: Unfortunately, his right foot has reopened. It is  located at the most posterior aspect of his surgical incision. The area was noted to have drainage KAOS, HOYE ([id number]) [id number] Page 5 of 15 coming from it when his padding was removed today. Underneath some callus and senescent skin, there is an opening. No purulent drainage or malodor. On the left foot, the wound is again unchanged. There is some light blue staining on the callus, but no malodor or purulent drainage. 10/13; right and left heel remanence of extensive plantar foot wounds. These are better than I remember by quite a big margin however he is still left with wounds on the left plantar heel and the right plantar heel. Been  using endoform bilaterally. A culture was done that showed apparently Pseudomonas but we are still waiting for the [facility] antibiotic to use gentamicin today. He is still very active by description I am not sure about the offloading of his noncasted right foot 10/20; both wounds right and left heel debrided not much change from last week. [employer] has arrived which is linezolid, gentamicin and ciprofloxacin we will use this with endoform. T contact cast on the left otal 06/02/2022: Both wounds are smaller today. There is still a fair amount of callus buildup around the right foot ulcer. The left is more superficial and nearly flush with the surrounding tissues. Also with slough and eschar buildup. 06/10/2022: The right sided wound appears to be nearly closed, if not completely so, although it is somewhat difficult to tell given the abnormal tissue and scarring in his foot. There is a fair amount of callus and crust accumulation. On the left, the wound looks about the same, again with callus and slough. He has an appointment next Thursday with Dr. Loel Lofty in podiatry; I am hopeful that there may be some reconstructive options available for Mr. Girdner. 06/16/2022: Both wounds have some eschar and callus accumulation. The right sided wound is extremely narrow and barely open; the left is narrower than last week. There is a little bit of slough. He has his appointment in podiatry later today. 06/23/2022: The patient met with Dr. Loel Lofty last week and unfortunately, there are no reconstructive options that he believes would be helpful. He did order an MRI to evaluate for osteomyelitis and fortunately, none was seen. The left sided wound is a little bit shorter and narrower today. The right sided wound is about the same. There is callus and eschar accumulation bilaterally. 06/29/2022: Both feet have improved from last week. There is epithelium making a valiant effort to creep across the surface on  the left. The right side looks like it got a little dry and the deep crevasse in his midfoot has cracked. Both have eschar and there is some slough on the left. 07/07/2022: Both feet have improved. There is epithelium completely covering the calcaneus on the right with just a small opening in the crevasse in his midfoot. On the left, the open area of tissue is smaller but he continues to build up callus/eschar and slough. 07/15/2022: The opening in the midfoot on the right is about the same size, covered with eschar and a little bit of slough. The open portion of the left wound is narrower and shorter with a bit of slough buildup. He admits to being on his feet more than recommended. 12/14; as far as I can tell everything on the right foot is closed. There is some eschar I removed some of this I cannot identify any open wound here. As usual this will be a  very vulnerable area going forward. On the left this looks really quite healthy. I was pleasantly surprised to see how good this looked. Wound is certainly smaller and there appears to be healthy epithelialization. He has been using Promogran on the right and endoform on the left. He has been offloading the right foot with a heel offloading boot and he has a running shoe on the right foot 08/04/2022: The right foot remains closed. He has a thick cushioned insole in his sneaker. The left sided wound is smaller with just some slough and eschar accumulation. He is wearing the heel off loader on this foot. 08/15/2022: The right foot remains closed. The left wound has narrowed further. There is some slough and eschar accumulation. 08/25/2022: We put him in a peg assist shoe insert and as a result, he has more epithelialization of the ulcer with minimal slough and eschar accumulation. 09/01/2022: The wound is smaller by about half this week. Still with some slough on the surface. The peg assist shoe seems to be doing a remarkable job of adequately offloading  the site. 09/08/2022: There is a little bit more epithelium coming in. There is some slough and callus buildup. 09/16/2022: The wound measures about the same size, but the epithelium that has grown in looks more robust and stronger. There is some slough and callus buildup. 09/23/2022: The wound remains about the same size. The skin edges are looking rather senescent. 09/29/2022: The aggressive debridement I performed last week seems to have been effective. The wound is smaller and has significantly less slough accumulation. 10/06/2022: There has been quite a bit of epithelialization since last week. There are still some open areas with slough accumulation. There is some callus buildup around the perimeter. 10/13/2022: Continued improvement. Minimal callus accumulation. 3/14; patient presents for follow-up. He has been using endoform to the wound bed without issues. He is using a surgical shoe with peg assist for offloading. 10/27/2022: The wound dimensions are stable. There is some senescent skin accumulation around the perimeter. 11/04/2022: Last week I performed a very aggressive debridement in an effort to stimulate the healing cascade. As has been the case when I have done this before, the wound has responded well. There has been epithelialization and contraction of the wound. There are just a couple of small open areas. Electronic Signature(s) Signed: 11/04/2022 11:32:13 AM By: Fredirick Maudlin MD FACS Entered By: Fredirick Maudlin on 11/04/2022 11:32:13 -------------------------------------------------------------------------------- Physical Exam Details Patient Name: Date of Service: [name], [place] 11/04/2022 11:00 A M Medical Record Number: [id number] Patient Account Number: [id number] Date of Birth/Sex: Treating RN: 03/09/1976 (47 y.o. M) Primary Care Provider: Cristie [name] Other Clinician: Referring Provider: Treating Provider/Extender: [name] in Treatment:  [address] ([id number]) [id number] Page 6 of 15 Constitutional Slightly hypertensive. . . . no acute distress. Respiratory Normal work of breathing on room air. Notes 11/04/2022: There has been epithelialization and contraction of the wound. There are just a couple of small open areas. Electronic Signature(s) Signed: 11/04/2022 11:32:55 AM By: Fredirick Maudlin MD FACS Entered By: Fredirick Maudlin on 11/04/2022 11:32:55 -------------------------------------------------------------------------------- Physician Orders Details Patient Name: Date of Service: [name], [place]. 11/04/2022 11:00 A M Medical Record Number: [id number] Patient Account Number: [id number] Date of Birth/Sex: Treating RN: 10-30-1975 (47 y.o. Collene [name] Primary Care Provider: Cristie [name] Other Clinician: Referring Provider: Treating Provider/Extender: [name] in Treatment: 120 Verbal / Phone Orders: No Diagnosis Coding ICD-10 Coding Code  Description L97.522 Non-pressure chronic ulcer of other part of left foot with fat layer exposed Follow-up Appointments ppointment in 1 week. - Dr. Celine [name] Room 3 Return A Anesthetic Wound #2 Left Calcaneus (In clinic) Topical Lidocaine 4% applied to wound bed - In clinic Bathing/ Shower/ Hygiene May shower and wash wound with soap and water. Edema Control - Lymphedema / SCD / Other Bilateral Lower Extremities Avoid standing for long periods of time. Moisturize legs daily. - as needed Off-Loading Other: - keep pressure off of the bottom of your feet. Elevate legs throughout the day. Use the Shoe with the PegAssist off-loading insole Additional Orders / Instructions Follow Nutritious Diet - Try to get 70-100 grams of Protein a day+ Wound Treatment Wound #2 - Calcaneus Wound Laterality: Left Cleanser: Normal Saline (Generic) Every Other Day/30 Days Discharge Instructions: Cleanse the wound with Normal  Saline prior to applying a clean dressing using gauze sponges, not tissue or cotton balls. Cleanser: Wound Cleanser (Generic) Every Other Day/30 Days Discharge Instructions: Cleanse the wound with wound cleanser prior to applying a clean dressing using gauze sponges, not tissue or cotton balls. Topical: compounding topical antibiotics from [place] Every Other Day/30 Days Discharge Instructions: apply directly to wound bed. (KEYSTONE) Prim Dressing: Endoform 2x2 in (Generic) Every Other Day/30 Days ary Discharge Instructions: Moisten with saline or Hydrogel Secondary Dressing: Optifoam Non-Adhesive Dressing, 4x4 in (Generic) Every Other Day/30 Days Discharge Instructions: Apply over primary dressing as directed. Secondary Dressing: Woven Gauze Sponge, Non-Sterile 4x4 in (Generic) Every Other Day/30 Days Discharge Instructions: Apply over primary dressing as directed. DAGIM, TOUSIGNANT ([id number]) [id number] Page 7 of 15 Secured With: 87M Medipore H Soft Cloth Surgical T ape, 4 x 10 (in/yd) (Generic) Every Other Day/30 Days Discharge Instructions: Secure with tape as directed. Compression Wrap: Kerlix Roll 4.5x3.1 (in/yd) Every Other Day/30 Days Discharge Instructions: Apply Kerlix and Coban compression as directed. Electronic Signature(s) Signed: 11/04/2022 12:00:48 PM By: Fredirick Maudlin MD FACS Entered By: Fredirick Maudlin on 11/04/2022 11:33:11 -------------------------------------------------------------------------------- Problem List Details Patient Name: Date of Service: [name], [place] 11/04/2022 11:00 A M Medical Record Number: [id number] Patient Account Number: [id number] Date of Birth/Sex: Treating RN: [birth year] (47 y.o. M) Primary Care Provider: Cristie [name] Other Clinician: Referring Provider: Treating Provider/Extender: [name] in Treatment: 120 Active Problems ICD-10 Encounter Code Description Active Date  MDM Diagnosis L97.522 Non-pressure chronic ulcer of other part of left foot with fat layer exposed 09/03/2020 No Yes Inactive Problems ICD-10 Code Description Active Date Inactive Date L97.512 Non-pressure chronic ulcer of other part of right foot with fat layer exposed 09/03/2020 09/03/2020 L89.893 Pressure ulcer of other site, stage 3 07/15/2020 07/15/2020 M62.81 Muscle weakness (generalized) 07/15/2020 07/15/2020 I10 Essential (primary) hypertension 07/15/2020 07/15/2020 M86.171 Other acute osteomyelitis, right ankle and foot 09/03/2020 09/03/2020 Resolved Problems Electronic Signature(s) Signed: 11/04/2022 11:31:06 AM By: Fredirick Maudlin MD FACS Entered By: Fredirick Maudlin on 11/04/2022 11:31:05 -------------------------------------------------------------------------------- Progress Note Details Patient Name: Date of Service: [name], [place]. 11/04/2022 11:00 A M Medical Record Number: [id number] Patient Account Number: [id number] Date of Birth/Sex: Treating RN: August 22, 1975 (47 y.o. M) Primary Care Provider: Cristie [name] Other Clinician: Referring Provider: Treating Provider/Extender: [name] in Treatment: [address] ([id number]) [id number] Page 8 of 15 Subjective Chief Complaint Information obtained from Patient Bilateral Plantar Foot Ulcers History of Present Illness (HPI) Wounds are12/03/2020 upon evaluation today patient presents for initial inspection here in our clinic concerning issues he has been  having with the bottoms of his feet bilaterally. He states these actually occurred as wounds when he was hospitalized for 5 months secondary to Covid. He was apparently with tilting bed where he was in an upright position quite frequently and apparently this occurred in some way shape or form during that time. Fortunately there is no sign of active infection at this time. No fevers, chills, nausea, vomiting, or diarrhea. With  that being said he still has substantial wounds on the plantar aspects of his feet Theragen require quite a bit of work to get these to heal. He has been using Santyl currently though that is been problematic both in receiving the medication as well as actually paid for it as it is become quite expensive. Prior to the experience with Covid the patient really did not have any major medical problems other than hypertension he does have some mild generalized weakness following the Covid experience. 07/22/2020 on evaluation today patient appears to be doing okay in regard to his foot ulcers I feel like the wound beds are showing signs of better improvement that I do believe the Iodoflex is helping in this regard. With that being said he does have a lot of drainage currently and this is somewhat blue/green in nature which is consistent with Pseudomonas. I do think a culture today would be appropriate for [place] to evaluate and see if that is indeed the case I would likely start him on antibiotic orally as well he is not allergic to Cipro knows of no issues he has had in the past 12/21; patient was admitted to the clinic earlier this month with bilateral presumed pressure ulcers on the bottom of his feet apparently related to excessive pressure from a tilt table arrangement in the intensive care unit. Patient relates this to being on ECMO but I am not really sure that is exactly related to that. I must say I have never seen anything like this. He has fairly extensive full-thickness wounds extending from his heel towards his midfoot mostly centered laterally. There is already been some healing distally. He does not appear to have an arterial issue. He has been using gentamicin to the wound surfaces with Iodoflex to help with ongoing debridement 1/6; this is a patient with pressure ulcers on the bottom of his feet related to excessive pressure from a standing position in the intensive care unit. He is complaining  of a lot of pain in the right heel. He is not a diabetic. 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 We have been using Iodoflex to help prepare the surfaces of both wounds for an advanced treatment product. He is nonambulatory spending most of his time in a wheelchair I have asked him not to propel the wheelchair with his heels 1/13; in general his wounds look better not much surface area change we have been using Iodoflex as of last week. I did an x-ray of the right heel as the patient was complaining of pain. I had some thoughts about a stress fracture perhaps Achilles tendon problems however what it showed was erosive changes along the inferior aspect of the calcaneus he now has a MRI booked for 1/20. 1/20; in general his wounds continue to be better. Some improvement in the large narrow areas proximally in his foot. He is still complaining of pain in the right heel and tenderness in certain areas of this wound. His MRI is tonight. I am not just looking for osteomyelitis that was brought  up on the x-ray I am wondering about stress fractures, tendon ruptures etc. He has no such findings on the left. Also noteworthy is that the patient had critical illness neuropathy and some of the discomfort may be actual improvement in nerve function I am just not sure. These wounds were initially in the setting of severe critical illness related to COVID-19. He was put in a standing position. He may have also been on pressors at the point contributing to tissue ischemia. By his description at some point these wounds were grossly necrotic extending proximally up into the Achilles part of his heel. I do not know that I have ever really seen pictures of them like this although they may exist in epic We have ordered Tri layer Oasis. I am trying to stimulate some granulation in these areas. This is of course assuming the MRI is negative for infection 1/27; since the patient was last here he saw  Dr. Juleen China of infectious disease. He is planned for vancomycin and ceftriaxone. Prior operative culture grew MSSA. Also ordered baseline lab work. He also ordered arterial studies although the ABIs in our clinic were normal as well as his clinical exam these were normal I do not think he needs to see vascular surgery. His ABIs at the PTA were 1.22 in the right triphasic waveforms with a normal TBI of 1.15 on the left ABI of 1.22 with triphasic waveforms and a normal TBI of 1.08. Finally he saw Dr. Amalia Hailey who will follow him in for 2 months. At this point I do not think he felt that he needed a procedure on the right calcaneal bone. Dr. Juleen China is elected for broad-spectrum antibiotic The patient is still having pain in the right heel. He walks with a walker 2/3; wounds are generally smaller. He is tolerating his IV antibiotics. I believe this is vancomycin and ceftriaxone. We are still waiting for Oasis burn in terms of his out-of-pocket max which he should be meeting soon given the IV antibiotics, MRIs etc. I have asked him to check in on this. We are using silver collagen in the meantime the wounds look better 2/10; tolerating IV vancomycin and Rocephin. We are waiting to apply for Oasis. Although I am not really sure where he is in his out-of-pocket max. 2/17 started the first application of Oasis trilayer. Still on antibiotics. The wounds have generally look better. The area on the left has a little more surface slough requiring debridement [id number]; second application of Oasis trilayer. The wound surface granulation is generally look better. The area on the left with undermining laterally I think is come in a bit. 10/08/2020 upon evaluation today patient is here today for [employer] application #3. Fortunately he seems to be doing extremely well with regard to this and we are seeing a lot of new epithelial growth which is great news. Fortunately there is no signs of active infection at this  time. 10/16/2020 upon evaluation today patient appears to be doing well with regard to his foot ulcers. Do believe the Oasis has been of benefit for him. I do not see any signs of infection right now which is great news and I think that he has a lot of new epithelial growth which is great to see as well. The patient is very pleased to hear all of this. I do think we can proceed with the Oasis trilayer #4 today. 3/18; not as much improvement in these areas on his heels that I was hoping. I  did reapply trilateral Oasis today the tissue looks healthier but not as much fill in as I was hoping. 3/25; better looking today I think this is come in a bit the tissue looks healthier. Triple layer Oasis reapplied #6 4/1; somewhat better looking definitely better looking surface not as much change in surface area as I was hoping. He may be spending more time [place] on days then he needs to although he does have heel offloading boots. Triple layer Oasis reapplied #7 4/7; unfortunately apparently [facility] will not approve any further Oasis which is unfortunate since the patient did respond nicely both in terms of the condition of the wound bed as well as surface area. There is still some drainage coming from the wound but not a lot there does not appear to be any infection 4/15; we have been using Hydrofera Blue. He continues to have nice rims of epithelialization on the right greater than the left. The left the epithelialization is coming from the tip of his heel. There is moderate drainage. In this that concerns me about a total contact cast. There is no evidence of infection 4/29; patient has been using Hydrofera Blue with dressing changes. He has no complaints or issues today. 5/5; using Hydrofera Blue. I actually think that he looks marginally better than the last time I saw this 3 weeks ago. There are rims of epithelialization on the left thumb coming from the medial side on the right. Using  Hydrofera Blue 5/12; using Hydrofera Blue. These continue to make improvements in surface area. His drainage was not listed as severe I therefore went ahead and put a cast on the left foot. Right foot we will continue to dress his previous 5/16; back for first total contact cast change. He did not tolerate this particularly well cast injury on the anterior tibia among other issues. Difficulty sleeping. I talked him about this in some detail and afterwards is elected to continue. I told him I would like to have a cast on for 3 weeks to see if this is going to help at all. I think he agreed 5/19; I think the wound is better. There is no tunneling towards his midfoot. The undermining medially also looks better. He has a rim of new skin distally. I think ZAMARRION, SEGARRA ([id number]) [id number] Page 9 of 15 we are making progress here. The area on the left also continues to look somewhat better to me using Hydrofera Blue. He has a list of complaints about the cast but none of them seem serious 5/26; patient presents for 1 week follow-up. He has been using a total contact cast and tolerating this well. Hydrofera Blue is the main dressing used. He denies signs of infection. 6/2 Hydrofera Blue total contact cast on the left. These were large ulcers that formed in intensive care unit where the patient was recovering from [place]. May have had something to do with being ventilated in an upright positiono Pressors etc. We have been able to get the areas down considerably and a viable surface. There is some epithelialization in both sides. Note made of drainage 6/9; changed to [facility] last time because of drainage. He arrives with better looking surfaces and dimensions on the left than the right. Paradoxically the right actually probes more towards his midfoot the left is largely close down but both of these look improved. Using a total contact cast on the left 6/16; complex  wounds on his bilateral plantar heels which were  initially pressure injury from a stay in the ICU with COVID. We have been using silver alginate most recently. His dimensions of come in quite dramatically however not recently. We have been putting the left foot in a total contact cast 6/23; complex wounds on the bilateral plantar heels. I been putting the left in the cast paradoxically the area on the right is the one that is going towards closure at a faster rate. Quite a bit of drainage on the left. The patient went to see Dr. Amalia Hailey who said he was going to standby for skin grafts. I had actually considered sending him for skin grafts however he would be mandatorily off his feet for a period of weeks to months. I am thinking that the area on the right is going to close on its own the area on the left has been more stubborn even though we have him in a total contact cast 6/30; took him out of a total contact cast last week is the right heel seem to be making better progress than the left where I was placing the cast. We are using silver alginate. Both wounds are smaller right greater than left 7/12; both wounds look as though they are making some progress. We are using silver alginate. Heel offloading boots 7/26; very gradual progress especially on the right. Using silver alginate. He is wearing heel offloading boots 8/18; he continues to close these wounds down very gradually. Using silver alginate. The problem polymen being definitive about this is areas of what appears to be callus around the margins. This is not a 100% of the area but certainly sizable especially on the right 9/1; bilateral plantar feet wounds secondary to prolonged pressure while being ventilated for COVID-19 in an upright position. Essentially pressure ulcers on the bottom of his feet. He is made substantial progress using silver alginate. 9/14; bilateral plantar feet wounds secondary to prolonged pressure. Making progress  using silver alginate. 9/29 bilateral plantar feet wounds secondary to prolonged pressure. I changed him to Iodoflex last week. MolecuLight showing reddened blush fluorescence 10/11; patient presents for follow-up. He has no issues or complaints today. He denies signs of infection. He continues to use Iodoflex and antibiotic ointment to the wound beds. 10/27; 2-week follow-up. No evidence of infection. He has callus and thick dry skin around the wound margins we have been using Iodoflex and Bactroban which was in response to a moderate left MolecuLight reddish blush fluorescence. 11/10; 2-week follow-up. Wound margins again have thick callus however the measurements of the actual wound sites are a lot smaller. Everything looks reasonably healthy here. We have been using Iodoflex He was approved for prime matrix but I have elected to delay this given the improvement in the surface area. Hopefully I will not regret that decision as were getting close to the end of the year in terms of insurance payment 12/8; 2-week follow-up. Wounds are generally smaller in size. These were initially substantial wounds extending into the forefoot all the way into the heel on the bilateral plantar feet. They are now both located on the plantar heel distal aspect both of these have a lot of callus around the wounds I used a #5 curette to remove this on the right and the left also some subcutaneous debris to try and get the wound edges were using Iodoflex. He has heel offloading shoe 12/22; 2-week follow-up. Not really much improvement. He has thick callus around the outer edges of both wounds. I remove this there  is some nonviable subcutaneous tissue as well. We have been using Iodoflex. Her intake nurse and myself spontaneously thought of a total contact cast I went back in May. At that time we really were not seeing much of an improvement with a cast although the wound was in a much different situation I would like to  retry this in 2 weeks and I discussed this with the patient 08/12/2021; the patient has had some improvement with the Iodoflex. The the area on the left heel plantar more improved than the right. I had to put him in a total contact cast on the left although I decided to put that off for 2 weeks. I am going to change his primary dressing to silver collagen. I think in both areas he has had some improvement most of the healing seems to be more proximal in the heel. The wounds are in the mid aspect. A lot of thick callus on the right heel however. 1/19; we are using silver collagen on both plantar heel areas. He has had some improvement today. The left did not require any debridement. He still had some eschar on the right that was debrided but both seem to have contracted. I did not put it total contact cast on him today 2/2 we have been using silver collagen. The area on the right plantar heel has areas that appear to be epithelialized interspersed with dry flaking callus and dry skin. I removed this. This really looks better than on the other side. On the left still a large area with raised edges and debris on the surface. 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 He has no issues or complaints today. 2/9; not much change in the left foot wound with 1 week of a cast we are using silver collagen. Silver collagen on the right side. The right side has been the better wound surface. We will reapply the total contact cast on the left 2/16; not much improvement on either side I been using silver collagen with a total contact cast on the left. I'm changing the Hydrofera Blue still with a total contact cast on the left 2/23; some improvement on both sides. Disappointing that he has thick callus around the area that we are putting in a total contact cast on the left. We've been  using Hydrofera Blue on both wound areas. This is a man who at essentially pressure ulcers in addition to ischemia caused by medications to support his blood pressure (pressors) in the ICU. He was being ventilated in the standing position for severe Covid. A Shiley the wounds extended across his entire foot but are now localized to his plantar heels bilaterally. We have made progress however neither areas healed. I continue to think the total contact cast is helped albeit painstakingly slowly. He has never wanted a plastic surgery consult although I don't know that they would be interested in grafting in area in this location. 10/07/2021: Continued improvement bilaterally. There is still some callus around the left wound, despite the total contact cast. He has some increased pain in his right midfoot around 1 particular area. This has been painful in the past but seems to be a little bit worse. When his cast was removed today, there was an area on the heel of the left foot that looks a bit macerated. He is also complaining of  pain in his left thigh and hip which he thinks is secondary to the limb length discrepancy caused by the cast. 10/14/2021: He continues to improve. A little bit less callus around the left wound. He continues to endorse pain in his right midfoot, but this is not as significant as it was last week. The maceration on his left heel is improved. 10/21/2021: Continued improvement to both wounds. The maceration on his left heel is no longer evident. Less callus bilaterally. Epithelialization progressing. 10/28/2021: Significant improvement this week. The right sided wound is nearly closed with just a small open area at the middle. No maceration seen on the left heel. Continued epithelialization on both sides. No concern for infection. 11/04/2021: T oday, the wounds were measured a little bit differently and come out as larger, but I actually think they are about the same to potentially  even smaller, particularly on the left. He continues to accumulate some callus on the right. 11/11/2021: T oday, the patient is expressing some concern that the left wound, despite being in the total contact cast, is not progressing at the same rate as the 1 on the right. He is interested in trying a week without the cast to see how the wound does. The wounds are roughly the same size as last week, with the right perhaps being a little bit smaller. He continues to build up callus on both sites. 11/18/2021: Last week, I permitted the patient to go without his total contact cast, just to see if the cast was really making any difference. Today, both wounds JAHMIER, WILLETT ([id number]) [id number] Page 10 of 15 have deteriorated to some extent, suggesting that the cast is providing benefit, at least on the left. Both are larger and have accumulated callus, slough, and other debris. 11/26/2021: I debrided both wounds quite aggressively last week in an effort to stimulate the healing cascade. This appears to have been effective as the left sided wound is a full centimeter shorter in length. Although the right was measured slightly larger, on inspection, it looks as though an area of epithelialized tissue was included in the measurements. We have been using PolyMem Ag on the wound surfaces with a total contact cast to the left. 12/02/2021: It appears that the intake personnel are including epithelialized tissue in his wound measurements; the right wound is almost completely epithelialized; there is just a crater at the proximal midfoot with some open areas. On the left, he has built up some callus, but the overall wound surface looks good. There is some senescent skin around the wound margin. He has been in PolyMem Ag bilaterally with a total contact cast on the left. 12/09/2021: The right wound is nearly closed; there is just a small open area at the mid calcaneus. On the left, the wound  is smaller with minimal callus buildup. No significant drainage. 12/16/2021: The right calcaneal wound remains minimally open at the mid calcaneus; the rest has epithelialized. On the left, the wound is also a little bit smaller. There is some senescent tissue on the periphery. He is getting his first application of a trial skin substitute called Vendaje today. 12/23/2021: The wound on his right calcaneus is nearly closed; there is just a small area at the most distal aspect of the calcaneus that is open. On the left, the area where we applied to the skin substitute has a healthier-looking bed of granulation tissue. The wound dimensions are not significantly different on this side but the wound surface is  improved. 12/30/2021: The wound on the right calcaneus has not changed significantly aside from some accumulation of callus. On the left, the open area is smaller and continues to have an improved surface. He continues to accumulate callus around the wound. He is here for his third application of Vendaje. 01/06/2022: The right calcaneal wound is down to just a couple of millimeters. He continues to accumulate periwound callus. He unfortunately got his cast wet earlier in the week and his left foot is macerated, resulting in some superficial skin loss just distal to the open wound. The open wound itself, however, is much smaller and has a healthier appearing surface. He is here for his fourth application of Vendaje. 01/13/2022: The right calcaneal wound is about the same. Unfortunately, once again, his cast got wet and his foot is again macerated. This is caused the left calcaneal wound to enlarge. He is here for his fifth application of Vendaje. 01/20/2022: The right calcaneal wound is very small. There is some periwound callus accumulation. He purchased a new cast protector last week and this has been effective in avoiding the maceration that has been occurring on the left. The left calcaneal wound is  narrower and has a healthy and viable-appearing surface. He is here for his 6 application of Vendaje. 01/27/2022: The right calcaneal wound is down to just a pinhole. There is some periwound slough and callus. On the left, the wound is narrower and shorter by about a centimeter. The surface is robust and viable-appearing. Unfortunately, the rep for the trial skin substitute product did not provide any for [place] to use today. 02/04/2022: The right calcaneal wound remains unchanged. There is more accumulated callus. On the left, although the intake nurse measured it a little bit longer, it looks about the same to me. The surface has a layer of slough, but underneath this, there is good granulation tissue. 02/10/2022: The right calcaneus wound is nearly closed. There is still some callus that builds up around the site. The left side looks about the same in terms of dimensions, but the surface is more robust and vital-appearing. 02/16/2022: The area of the right calcaneus that was nearly closed last week has closed, but there is a small opening at the mid foot where it looks like some moisture got retained and caused some reopening. The left foot wound is narrower and shallower. Both sites have a fair amount of periwound callus and eschar. 02/24/2022: The small midfoot opening on the right calcaneus is a little bit smaller today. The left foot wound is narrower and shallower. He continues to accumulate periwound callus. No concern for infection. 03/01/2022: The patient came to clinic early because he showered and got his cast wet. Fortunately, there is no significant maceration to his foot but the callus softened and it looks like the wound on his left calcaneus may be a little bit wider. The wound on his right calcaneus is just a narrow slit. Continued accumulation of periwound callus bilaterally. 03/08/2022: The wound on his right calcaneus is very nearly closed, just a small pinpoint opening under a bit of  eschar; the left wound has come in quite a bit, as well. It is narrower and shorter than at our last visit. Still with accumulated callus and eschar bilaterally. 03/17/2022: The right calcaneal wound is healed. The left wound is smaller and the surface itself is very clean, but there is some blue-green staining on the periwound callus, concerning for Pseudomonas aeruginosa. 03/23/2022: The right calcaneal wound remains  closed. The left wound continues to contract. No further blue-green staining. Small amount of callus and slough accumulation. 03/28/2022: He came in early today because he had gotten his cast wet. On inspection, the wound itself did not get wet or macerated, just a little bit of the forefoot. The wound itself is basically unchanged. 04/07/2022: The right foot wound remains closed. The left wound is the smallest that I have seen it to date. It is narrower and shorter. It still continues to accumulate slough on the surface. 04/15/2022: There is a band of epithelium now dividing the small left plantar foot wound in 2. There is still some slough on the surface. 04/21/2022: The wound continues to narrow. Just a little bit of slough on the surface. He seems to be responding well to endoform. 04/28/2022: Continued slow contraction of the wound. There is a little slough on the surface and some periwound callus. We have been using endoform and total contact cast. 05/05/2022: The wound appears to have stalled. There is slough and some periwound eschar/callus. No concern for infection, however. 05/12/2022: Unfortunately, his right foot has reopened. It is located at the most posterior aspect of his surgical incision. The area was noted to have drainage coming from it when his padding was removed today. Underneath some callus and senescent skin, there is an opening. No purulent drainage or malodor. On the left foot, the wound is again unchanged. There is some light blue staining on the callus, but no  malodor or purulent drainage. 10/13; right and left heel remanence of extensive plantar foot wounds. These are better than I remember by quite a big margin however he is still left with wounds on the left plantar heel and the right plantar heel. Been using endoform bilaterally. A culture was done that showed apparently Pseudomonas but we are still waiting for the [facility] antibiotic to use gentamicin today. He is still very active by description I am not sure about the offloading of his noncasted right foot 10/20; both wounds right and left heel debrided not much change from last week. [employer] has arrived which is linezolid, gentamicin and ciprofloxacin we will use this with endoform. T contact cast on the left otal 06/02/2022: Both wounds are smaller today. There is still a fair amount of callus buildup around the right foot ulcer. The left is more superficial and nearly flush with the surrounding tissues. Also with slough and eschar buildup. ZVI, FURSE ([id number]) [id number] Page 11 of 15 06/10/2022: The right sided wound appears to be nearly closed, if not completely so, although it is somewhat difficult to tell given the abnormal tissue and scarring in his foot. There is a fair amount of callus and crust accumulation. On the left, the wound looks about the same, again with callus and slough. He has an appointment next Thursday with Dr. Loel Lofty in podiatry; I am hopeful that there may be some reconstructive options available for Mr. Favorito. 06/16/2022: Both wounds have some eschar and callus accumulation. The right sided wound is extremely narrow and barely open; the left is narrower than last week. There is a little bit of slough. He has his appointment in podiatry later today. 06/23/2022: The patient met with Dr. Loel Lofty last week and unfortunately, there are no reconstructive options that he believes would be helpful. He did order an MRI to evaluate for  osteomyelitis and fortunately, none was seen. The left sided wound is a little bit shorter and narrower today. The right sided wound is about  the same. There is callus and eschar accumulation bilaterally. 06/29/2022: Both feet have improved from last week. There is epithelium making a valiant effort to creep across the surface on the left. The right side looks like it got a little dry and the deep crevasse in his midfoot has cracked. Both have eschar and there is some slough on the left. 07/07/2022: Both feet have improved. There is epithelium completely covering the calcaneus on the right with just a small opening in the crevasse in his midfoot. On the left, the open area of tissue is smaller but he continues to build up callus/eschar and slough. 07/15/2022: The opening in the midfoot on the right is about the same size, covered with eschar and a little bit of slough. The open portion of the left wound is narrower and shorter with a bit of slough buildup. He admits to being on his feet more than recommended. 12/14; as far as I can tell everything on the right foot is closed. There is some eschar I removed some of this I cannot identify any open wound here. As usual this will be a very vulnerable area going forward. On the left this looks really quite healthy. I was pleasantly surprised to see how good this looked. Wound is certainly smaller and there appears to be healthy epithelialization. He has been using Promogran on the right and endoform on the left. He has been offloading the right foot with a heel offloading boot and he has a running shoe on the right foot 08/04/2022: The right foot remains closed. He has a thick cushioned insole in his sneaker. The left sided wound is smaller with just some slough and eschar accumulation. He is wearing the heel off loader on this foot. 08/15/2022: The right foot remains closed. The left wound has narrowed further. There is some slough and eschar  accumulation. 08/25/2022: We put him in a peg assist shoe insert and as a result, he has more epithelialization of the ulcer with minimal slough and eschar accumulation. 09/01/2022: The wound is smaller by about half this week. Still with some slough on the surface. The peg assist shoe seems to be doing a remarkable job of adequately offloading the site. 09/08/2022: There is a little bit more epithelium coming in. There is some slough and callus buildup. 09/16/2022: The wound measures about the same size, but the epithelium that has grown in looks more robust and stronger. There is some slough and callus buildup. 09/23/2022: The wound remains about the same size. The skin edges are looking rather senescent. 09/29/2022: The aggressive debridement I performed last week seems to have been effective. The wound is smaller and has significantly less slough accumulation. 10/06/2022: There has been quite a bit of epithelialization since last week. There are still some open areas with slough accumulation. There is some callus buildup around the perimeter. 10/13/2022: Continued improvement. Minimal callus accumulation. 3/14; patient presents for follow-up. He has been using endoform to the wound bed without issues. He is using a surgical shoe with peg assist for offloading. 10/27/2022: The wound dimensions are stable. There is some senescent skin accumulation around the perimeter. 11/04/2022: Last week I performed a very aggressive debridement in an effort to stimulate the healing cascade. As has been the case when I have done this before, the wound has responded well. There has been epithelialization and contraction of the wound. There are just a couple of small open areas. Patient History Information obtained from Patient. Family History Cancer -  Maternal Grandparents, Diabetes - Father,Paternal Grandparents, Heart Disease - Maternal Grandparents, Hypertension - Father,Paternal Grandparents, Lung Disease -  Siblings, No family history of Hereditary Spherocytosis, Kidney Disease, Seizures, Stroke, Thyroid Problems, Tuberculosis. Social History Never smoker, Marital Status - Married, Alcohol Use - Never, Drug Use - No History, Caffeine Use - Daily - tea, soda. Medical History Eyes Denies history of Cataracts, Glaucoma, Optic Neuritis Ear/Nose/Mouth/Throat Denies history of Chronic sinus problems/congestion, Middle ear problems Hematologic/Lymphatic Denies history of Anemia, Hemophilia, Human Immunodeficiency Virus, Lymphedema, Sickle Cell Disease Respiratory Patient has history of Asthma Denies history of Aspiration, Chronic Obstructive Pulmonary Disease (COPD), Pneumothorax, Sleep Apnea, Tuberculosis Cardiovascular Patient has history of Angina - with COVID, Hypertension Denies history of Arrhythmia, Congestive Heart Failure, Coronary Artery Disease, Deep Vein Thrombosis, Hypotension, Myocardial Infarction, Peripheral Arterial Disease, Peripheral Venous Disease, Phlebitis, Vasculitis Gastrointestinal Denies history of Cirrhosis , Colitis, Crohnoos, Hepatitis A, Hepatitis B, Hepatitis C Endocrine Denies history of Type I Diabetes, Type II Diabetes Genitourinary Denies history of End Stage Renal Disease TOREAN, SOLAR ([id number]) [id number] Page 12 of 15 Immunological Denies history of Lupus Erythematosus, Raynaudoos, Scleroderma Integumentary (Skin) Denies history of History of Burn Musculoskeletal Denies history of Gout, Rheumatoid Arthritis, Osteoarthritis, Osteomyelitis Neurologic Denies history of Dementia, Neuropathy, Quadriplegia, Paraplegia, Seizure Disorder Oncologic Denies history of Received Chemotherapy, Received Radiation Psychiatric Denies history of Anorexia/bulimia, Confinement Anxiety Hospitalization/Surgery History - COVID PNA 07/22/2019- 11/14/2019. - 03/27/2020 wound debridement/ skin graft. Medical A Surgical History  Notes nd Constitutional Symptoms (General Health) COVID PNA 07/22/2019-11/14/2019 [facility] ECMO, foot drop left foot , Genitourinary kidney stone Psychiatric anxiety Objective Constitutional Slightly hypertensive. no acute distress. Vitals Time Taken: 11:11 AM, Height: 69 in, Weight: 280 lbs, BMI: 41.3, Temperature: 97.9 F, Pulse: 73 bpm, Respiratory Rate: 18 breaths/min, Blood Pressure: 147/90 mmHg. Respiratory Normal work of breathing on room air. General Notes: 11/04/2022: There has been epithelialization and contraction of the wound. There are just a couple of small open areas. Integumentary (Hair, Skin) Wound #2 status is Open. Original cause of wound was Pressure Injury. The date acquired was: 10/07/2019. The wound has been in treatment 120 weeks. The wound is located on the Left Calcaneus. The wound measures 1cm length x 0.5cm width x 0.1cm depth; 0.393cm^2 area and 0.039cm^3 volume. There is Fat Layer (Subcutaneous Tissue) exposed. There is no tunneling or undermining noted. There is a small amount of serosanguineous drainage noted. The wound margin is thickened. There is medium (34-66%) pink granulation within the wound bed. There is a medium (34-66%) amount of necrotic tissue within the wound bed including Eschar and Adherent Slough. The periwound skin appearance had no abnormalities noted for moisture. The periwound skin appearance had no abnormalities noted for color. The periwound skin appearance exhibited: Callus, Scarring. Periwound temperature was noted as No Abnormality. Assessment Active Problems ICD-10 Non-pressure chronic ulcer of other part of left foot with fat layer exposed Procedures Wound #2 Pre-procedure diagnosis of Wound #2 is a Pressure Ulcer located on the Left Calcaneus . There was a Excisional Skin/Subcutaneous Tissue Debridement with a total area of 0.5 sq cm performed by Fredirick Maudlin, MD. With the following instrument(s): Curette to remove Non-Viable  tissue/material. Material removed includes Subcutaneous Tissue and Slough and after achieving pain control using Lidocaine 4% T opical Solution. No specimens were taken. A time out was conducted at 11:18, prior to the start of the procedure. A Minimum amount of bleeding was controlled with Pressure. The procedure was tolerated well with a pain level of 0 throughout  and a pain level of 0 following the procedure. Post Debridement Measurements: 1cm length x 0.5cm width x 0.1cm depth; 0.039cm^3 volume. Post debridement Stage noted as Category/Stage III. Character of Wound/Ulcer Post Debridement is improved. Post procedure Diagnosis Wound #2: Same as Pre-Procedure General Notes: Scribed for Dr. Celine [name] by J.Scotton. ASHLEY, POLACEK ([id number]) [id number] Page 13 of 15 Plan Follow-up Appointments: Return Appointment in 1 week. - Dr. Celine [name] Room 3 Anesthetic: Wound #2 Left Calcaneus: (In clinic) Topical Lidocaine 4% applied to wound bed - In clinic Bathing/ Shower/ Hygiene: May shower and wash wound with soap and water. Edema Control - Lymphedema / SCD / Other: Avoid standing for long periods of time. Moisturize legs daily. - as needed Off-Loading: Other: - keep pressure off of the bottom of your feet. Elevate legs throughout the day. Use the Shoe with the PegAssist off-loading insole Additional Orders / Instructions: Follow Nutritious Diet - Try to get 70-100 grams of Protein a day+ WOUND #2: - Calcaneus Wound Laterality: Left Cleanser: Normal Saline (Generic) Every Other Day/30 Days Discharge Instructions: Cleanse the wound with Normal Saline prior to applying a clean dressing using gauze sponges, not tissue or cotton balls. Cleanser: Wound Cleanser (Generic) Every Other Day/30 Days Discharge Instructions: Cleanse the wound with wound cleanser prior to applying a clean dressing using gauze sponges, not tissue or cotton balls. Topical: compounding topical  antibiotics from [place] Every Other Day/30 Days Discharge Instructions: apply directly to wound bed. (KEYSTONE) Prim Dressing: Endoform 2x2 in (Generic) Every Other Day/30 Days ary Discharge Instructions: Moisten with saline or Hydrogel Secondary Dressing: Optifoam Non-Adhesive Dressing, 4x4 in (Generic) Every Other Day/30 Days Discharge Instructions: Apply over primary dressing as directed. Secondary Dressing: Woven Gauze Sponge, Non-Sterile 4x4 in (Generic) Every Other Day/30 Days Discharge Instructions: Apply over primary dressing as directed. Secured With: 30M Medipore H Soft Cloth Surgical T ape, 4 x 10 (in/yd) (Generic) Every Other Day/30 Days Discharge Instructions: Secure with tape as directed. Com pression Wrap: Kerlix Roll 4.5x3.1 (in/yd) Every Other Day/30 Days Discharge Instructions: Apply Kerlix and Coban compression as directed. 11/04/2022: There has been epithelialization and contraction of the wound. There are just a couple of small open areas. I used a curette to debride slough and subcutaneous tissue from the small remaining open areas. He forgot his Keystone topical antibiotic compound (prescription drug), so we will use topical gentamicin along with endoform. He will continue to use the [place] at home. Continue peg assist insert for offloading. Follow-up in 1 week Electronic Signature(s) Signed: 11/04/2022 11:33:58 AM By: Fredirick Maudlin MD FACS Entered By: Fredirick Maudlin on 11/04/2022 11:33:58 -------------------------------------------------------------------------------- HxROS Details Patient Name: Date of Service: [name], [name] 11/04/2022 11:00 A M Medical Record Number: [id number] Patient Account Number: [id number] Date of Birth/Sex: Treating RN: April 05, 1976 (47 y.o. M) Primary Care Provider: Cristie [name] Other Clinician: Referring Provider: Treating Provider/Extender: [name] in Treatment: 120 Information Obtained  From Patient Constitutional Symptoms (General Health) Medical History: Past Medical History Notes: COVID PNA 07/22/2019-11/14/2019 [facility] ECMO, foot drop left foot , Eyes Medical History: Negative for: Cataracts; Glaucoma; Optic Neuritis Ear/Nose/Mouth/Throat Medical History: Negative for: Chronic sinus problems/congestion; Middle ear problems Hematologic/Lymphatic RYSON, BLACKHAM ([id number]) [id number] Page 14 of 15 Medical History: Negative for: Anemia; Hemophilia; Human Immunodeficiency Virus; Lymphedema; Sickle Cell Disease Respiratory Medical History: Positive for: Asthma Negative for: Aspiration; Chronic Obstructive Pulmonary Disease (COPD); Pneumothorax; Sleep Apnea; Tuberculosis Cardiovascular Medical History: Positive for: Angina - with COVID; Hypertension Negative for: Arrhythmia;  Congestive Heart Failure; Coronary Artery Disease; Deep Vein Thrombosis; Hypotension; Myocardial Infarction; Peripheral Arterial Disease; Peripheral Venous Disease; Phlebitis; Vasculitis Gastrointestinal Medical History: Negative for: Cirrhosis ; Colitis; Crohns; Hepatitis A; Hepatitis B; Hepatitis C Endocrine Medical History: Negative for: Type I Diabetes; Type II Diabetes Genitourinary Medical History: Negative for: End Stage Renal Disease Past Medical History Notes: kidney stone Immunological Medical History: Negative for: Lupus Erythematosus; Raynauds; Scleroderma Integumentary (Skin) Medical History: Negative for: History of Burn Musculoskeletal Medical History: Negative for: Gout; Rheumatoid Arthritis; Osteoarthritis; Osteomyelitis Neurologic Medical History: Negative for: Dementia; Neuropathy; Quadriplegia; Paraplegia; Seizure Disorder Oncologic Medical History: Negative for: Received Chemotherapy; Received Radiation Psychiatric Medical History: Negative for: Anorexia/bulimia; Confinement Anxiety Past Medical History  Notes: anxiety Immunizations Pneumococcal Vaccine: Received Pneumococcal Vaccination: No Implantable Devices None Hospitalization / Surgery History Type of Hospitalization/Surgery COVID PNA 07/22/2019- 11/14/2019 03/27/2020 wound debridement/ skin graft JUANDANIEL, NELMS ([id number]) [id number] Page 15 of 15 Family and Social History Cancer: Yes - Maternal Grandparents; Diabetes: Yes - Father,Paternal Grandparents; Heart Disease: Yes - Maternal Grandparents; Hereditary Spherocytosis: No; Hypertension: Yes - Father,Paternal Grandparents; Kidney Disease: No; Lung Disease: Yes - Siblings; Seizures: No; Stroke: No; Thyroid Problems: No; Tuberculosis: No; Never smoker; Marital Status - Married; Alcohol Use: Never; Drug Use: No History; Caffeine Use: Daily - tea, soda; Financial Concerns: No; Food, Clothing or Shelter Needs: No; Support System Lacking: No; Transportation Concerns: No Engineer, maintenance) Signed: 11/04/2022 12:00:48 PM By: Fredirick Maudlin MD FACS Entered By: Fredirick Maudlin on 11/04/2022 11:32:23 -------------------------------------------------------------------------------- SuperBill Details Patient Name: Date of Service: [name], [place] 11/04/2022 Medical Record Number: [id number] Patient Account Number: [id number] Date of Birth/Sex: Treating RN: 1976/02/24 (47 y.o. M) Primary Care Provider: Cristie [name] Other Clinician: Referring Provider: Treating Provider/Extender: [name] in Treatment: 120 Diagnosis Coding ICD-10 Codes Code Description [phone] Non-pressure chronic ulcer of other part of left foot with fat layer exposed Facility Procedures : CPT4 Code: [id number] Description: [place] TISSUE 20 SQ CM/< ICD-10 Diagnosis Description L97.522 Non-pressure chronic ulcer of other part of left foot with fat layer exposed Modifier: Quantity: 1 Physician Procedures : CPT4 Code Description Modifier [id number]  [id number] - WC PHYS LEVEL 4 - EST PT 25 ICD-10 Diagnosis Description L97.522 Non-pressure chronic ulcer of other part of left foot with fat layer exposed Quantity: 1 : [id number] - WC PHYS SUBQ TISS 20 SQ CM ICD-10 Diagnosis Description L97.522 Non-pressure chronic ulcer of other part of left foot with fat layer exposed Quantity: 1 Electronic Signature(s) Signed: 11/04/2022 11:34:14 AM By: Fredirick Maudlin MD FACS Entered By: Fredirick Maudlin on 11/04/2022 11:34:13

## 2022-11-05 NOTE — Progress Notes (Signed)
[name], [name] ([id number]) [id number] Page 1 of 6 Visit Report for 11/04/2022 Arrival Information Details Patient Name: Date of Service: [name], [place] 11/04/2022 11:00 A M Medical Record Number: [id number] Patient Account Number: [id number] Date of Birth/Sex: Treating RN: [birth year] (47 y.o. [name] Primary Care Tiarra Anastacio: [name] Other Clinician: Referring Anahlia Iseminger: Treating Taitum Alms/Extender: [name] in Treatment: 27 Visit Information History Since Last Visit Added or deleted any medications: No Patient Arrived: Wheel Chair Any new allergies or adverse reactions: No Arrival Time: 11:09 Had a fall or experienced change in No Accompanied By: son activities of daily living that may affect Transfer Assistance: Manual risk of falls: Patient Identification Verified: Yes Signs or symptoms of abuse/neglect since last visito No Patient Requires Transmission-Based Precautions: No Hospitalized since last visit: No Patient Has Alerts: No Implantable device outside of the clinic excluding No cellular tissue based products placed in the center since last visit: Has Dressing in Place as Prescribed: Yes Pain Present Now: No Electronic Signature(s) Signed: 11/04/2022 4:41:04 PM By: [name] Entered By: [name] on 11/04/2022 11:10:00 -------------------------------------------------------------------------------- Encounter Discharge Information Details Patient Name: Date of Service: [name], [place] E. 11/04/2022 11:00 A M Medical Record Number: [id number] Patient Account Number: [id number] Date of Birth/Sex: Treating RN: 1975-09-28 (47 y.o. [name] Primary Care Rashena Dowling: [name] Other Clinician: Referring Benjiman Sedgwick: Treating Delan Ksiazek/Extender: [name] in Treatment: 120 Encounter Discharge Information Items Post Procedure Vitals Discharge Condition:  Stable Temperature (F): 97.9 Ambulatory Status: Wheelchair Pulse (bpm): 73 Discharge Destination: Home Respiratory Rate (breaths/min): 18 Transportation: Private Auto Blood Pressure (mmHg): 147/90 Accompanied By: son Schedule Follow-up Appointment: Yes Clinical Summary of Care: Patient Declined Electronic Signature(s) Signed: 11/04/2022 4:41:04 PM By: [name] Entered By: [name] on 11/04/2022 16:16:57 -------------------------------------------------------------------------------- Lower Extremity Assessment Details Patient Name: Date of Service: [name], [place] E. 11/04/2022 11:00 A M Medical Record Number: [id number] Patient Account Number: [id number] Date of Birth/Sex: Treating RN: 12/21/1975 (47 y.o. [name] Primary Care Kalena Mander: [name] Other Clinician: Referring Rebekkah Powless: Treating Shaton Lore/Extender: [name] in Treatment: 120 Edema Assessment Assessed: [name]: No] [name]: No] G[LeftCHANDRA, [name] [id number] [Right: [id number] Page 2 of 6] Edema: [Left: N] [Right: o] Calf Left: Right: Point of Measurement: 29 cm From Medial Instep 44 cm Ankle Left: Right: Point of Measurement: 9 cm From Medial Instep 26 cm Vascular Assessment Pulses: Dorsalis Pedis Palpable: [Left:Yes] Electronic Signature(s) Signed: 11/04/2022 4:41:04 PM By: [name] Entered By: [name] on 11/04/2022 11:11:33 -------------------------------------------------------------------------------- Multi Wound Chart Details Patient Name: Date of Service: [name], [place] 11/04/2022 11:00 A M Medical Record Number: [id number] Patient Account Number: [id number] Date of Birth/Sex: Treating RN: 1976/01/31 (47 y.o. M) Primary Care Shenetta Schnackenberg: [name] Other Clinician: Referring Gunter Conde: Treating Tamantha Saline/Extender: [name] in Treatment: 120 Vital Signs Height(in):  69 Pulse(bpm): 73 Weight(lbs): 280 Blood Pressure(mmHg): 147/90 Body Mass Index(BMI): 41.3 Temperature(F): 97.9 Respiratory Rate(breaths/min): 18 [2:Photos:] [N/A:N/A] Left Calcaneus N/A N/A Wound Location: Pressure Injury N/A N/A Wounding Event: Pressure Ulcer N/A N/A Primary Etiology: Asthma, Angina, Hypertension N/A N/A Comorbid History: 10/07/2019 N/A N/A Date Acquired: 120 N/A N/A Weeks of Treatment: Open N/A N/A Wound Status: No N/A N/A Wound Recurrence: 1x0.5x0.1 N/A N/A Measurements L x W x D (cm) 0.393 N/A N/A A (cm) : rea 0.039 N/A N/A Volume (cm) : 98.50% N/A N/A % Reduction in A rea: 99.90% N/A  N/A % Reduction in Volume: Category/Stage III N/A N/A Classification: Small N/A N/A Exudate A mount: Serosanguineous N/A N/A Exudate Type: red, brown N/A N/A Exudate Color: Thickened N/A N/A Wound Margin: Medium (34-66%) N/A N/A Granulation A mount: Pink N/A N/A Granulation Quality: Medium (34-66%) N/A N/A Necrotic A mount: Eschar, Adherent Slough N/A N/A Necrotic Tissue: Fat Layer (Subcutaneous Tissue): Yes N/A N/A Exposed Structures: Fascia: No Tendon: No [name], [name] ([id number]) [id number] Page 3 of 6 Muscle: No Joint: No Bone: No Large (67-100%) N/A N/A Epithelialization: Debridement - Excisional N/A N/A Debridement: Pre-procedure Verification/Time Out 11:18 N/A N/A Taken: Lidocaine 4% Topical Solution N/A N/A Pain Control: Subcutaneous, Slough N/A N/A Tissue Debrided: Skin/Subcutaneous Tissue N/A N/A Level: 0.5 N/A N/A Debridement A (sq cm): rea Curette N/A N/A Instrument: Minimum N/A N/A Bleeding: Pressure N/A N/A Hemostasis A chieved: 0 N/A N/A Procedural Pain: 0 N/A N/A Post Procedural Pain: Procedure was tolerated well N/A N/A Debridement Treatment Response: 1x0.5x0.1 N/A N/A Post Debridement Measurements L x W x D (cm) 0.039 N/A N/A Post Debridement Volume: (cm) Category/Stage III  N/A N/A Post Debridement Stage: Callus: Yes N/A N/A Periwound Skin Texture: Scarring: Yes Maceration: Yes N/A N/A Periwound Skin Moisture: No Abnormalities Noted N/A N/A Periwound Skin Color: No Abnormality N/A N/A Temperature: Debridement N/A N/A Procedures Performed: Treatment Notes Electronic Signature(s) Signed: 11/04/2022 11:31:13 AM By: Fredirick Maudlin MD FACS Entered By: Fredirick Maudlin on 11/04/2022 11:31:13 -------------------------------------------------------------------------------- Multi-Disciplinary Care Plan Details Patient Name: Date of Service: [name], [place] E. 11/04/2022 11:00 A M Medical Record Number: [id number] Patient Account Number: [id number] Date of Birth/Sex: Treating RN: 26-Mar-1976 (47 y.o. [name] Primary Care Kynslie Ringle: [name] Other Clinician: Referring Cederic Mozley: Treating Natayla Cadenhead/Extender: [name] in Treatment: [place] reviewed with physician Active Inactive Wound/Skin Impairment Nursing Diagnoses: Impaired tissue integrity Knowledge deficit related [name] ulceration/compromised skin integrity Goals: Patient/caregiver will verbalize understanding of skin care regimen Date Initiated: 07/15/2020 Target Resolution Date: 03/08/2023 Goal Status: Active Ulcer/skin breakdown will have a volume reduction of 30% by week 4 Date Initiated: 07/15/2020 Date Inactivated: 08/20/2020 Target Resolution Date: 09/03/2020 Goal Status: Unmet Unmet Reason: no major changes. Ulcer/skin breakdown will heal within 14 weeks Date Initiated: 12/04/2020 Date Inactivated: 12/10/2020 Target Resolution Date: 12/10/2020 Unmet Reason: wounds still open at 14 Goal Status: Unmet weeks and today 21 weeks. Interventions: Assess patient/caregiver ability [name] obtain necessary supplies Assess patient/caregiver ability [name] perform ulcer/skin care regimen upon admission and as needed Assess ulceration(s) every  visit Provide education on ulcer and skin care Treatment Activities: Skin care regimen initiated : 07/15/2020 [name], [name] ([id number]) [phone].pdf Page 4 of 6 Topical wound management initiated : 07/15/2020 Notes: Electronic Signature(s) Signed: 11/04/2022 4:41:04 PM By: [name] Entered By: [name] on 11/04/2022 16:15:34 -------------------------------------------------------------------------------- Pain Assessment Details Patient Name: Date of Service: [name], [place] 11/04/2022 11:00 A M Medical Record Number: [id number] Patient Account Number: [id number] Date of Birth/Sex: Treating RN: [birth year] (47 y.o. [name] Primary Care Lanai Conlee: [name] Other Clinician: Referring Thelma Viana: Treating Kyjuan Gause/Extender: [name] in Treatment: 120 Active Problems Location of Pain Severity and Description of Pain Patient Has Paino No Site Locations Pain Management and Medication Current Pain Management: Electronic Signature(s) Signed: 11/04/2022 4:41:04 PM By: [name] Entered By: [name] on 11/04/2022 11:11:20 -------------------------------------------------------------------------------- Patient/Caregiver Education Details Patient Name: Date of Service: [name], [name] E. 3/29/2024andnbsp11:00 [place] Record Number: [id number] Patient Account Number:  [id number] Date of Birth/Gender: Treating RN: May 11, 1976 (47 y.o. [name] Primary Care Physician: [name] Other Clinician: Referring Physician: Treating Physician/Extender: [name] in Treatment: 120 Education Assessment Education Provided [name]: Patient Education Topics Provided Wound/Skin Impairment: Methods: Explain/Verbal Responses: Return demonstration correctly [name], [name] ([id number]) [phone].pdf Page 5 of 6 Electronic Signature(s) Signed: 11/04/2022  4:41:04 PM By: [name] Entered By: [name] on 11/04/2022 16:15:50 -------------------------------------------------------------------------------- Wound Assessment Details Patient Name: Date of Service: [name], [place] 11/04/2022 11:00 A M Medical Record Number: [id number] Patient Account Number: [id number] Date of Birth/Sex: Treating RN: Aug 29, 1975 (47 y.o. [name] Primary Care Philip Kotlyar: [name] Other Clinician: Referring Shadman Tozzi: Treating Kalik Hoare/Extender: [name] in Treatment: 120 Wound Status Wound Number: 2 Primary Etiology: Pressure Ulcer Wound Location: Left Calcaneus Wound Status: Open Wounding Event: Pressure Injury Comorbid History: Asthma, Angina, Hypertension Date Acquired: 10/07/2019 Weeks Of Treatment: 120 Clustered Wound: No Photos Wound Measurements Length: (cm) 1 Width: (cm) 0.5 Depth: (cm) 0.1 Area: (cm) 0.393 Volume: (cm) 0.039 % Reduction in Area: 98.5% % Reduction in Volume: 99.9% Epithelialization: Large (67-100%) Tunneling: No Undermining: No Wound Description Classification: Category/Stage III Wound Margin: Thickened Exudate Amount: Small Exudate Type: Serosanguineous Exudate Color: red, brown Foul Odor After Cleansing: No Slough/Fibrino No Wound Bed Granulation Amount: Medium (34-66%) Exposed Structure Granulation Quality: Pink Fascia Exposed: No Necrotic Amount: Medium (34-66%) Fat Layer (Subcutaneous Tissue) Exposed: Yes Necrotic Quality: Eschar, Adherent Slough Tendon Exposed: No Muscle Exposed: No Joint Exposed: No Bone Exposed: No Periwound Skin Texture Texture Color No Abnormalities Noted: No No Abnormalities Noted: Yes Callus: Yes Temperature / Pain Scarring: Yes Temperature: No Abnormality Moisture No Abnormalities Noted: Yes Treatment Notes [name], [name] ([id number]) [id number] Page 6 of 6 Wound #2 (Calcaneus) Wound Laterality:  Left Cleanser Normal Saline Discharge Instruction: Cleanse the wound with Normal Saline prior [name] applying a clean dressing using gauze sponges, not tissue or cotton balls. Wound Cleanser Discharge Instruction: Cleanse the wound with wound cleanser prior [name] applying a clean dressing using gauze sponges, not tissue or cotton balls. Peri-Wound Care Topical compounding topical antibiotics from [facility] Discharge Instruction: apply directly [name] wound bed. (KEYSTONE) Primary Dressing Endoform 2x2 in Discharge Instruction: Moisten with saline or Hydrogel Secondary Dressing Optifoam Non-Adhesive Dressing, 4x4 in Discharge Instruction: Apply over primary dressing as directed. Woven Gauze Sponge, Non-Sterile 4x4 in Discharge Instruction: Apply over primary dressing as directed. Secured With 25M Medipore H Soft Cloth Surgical T ape, 4 x 10 (in/yd) Discharge Instruction: Secure with tape as directed. Compression Wrap Kerlix Roll 4.5x3.1 (in/yd) Discharge Instruction: Apply Kerlix and Coban compression as directed. Compression Stockings Add-Ons Electronic Signature(s) Signed: 11/04/2022 4:41:04 PM By: [name] Entered By: [name] on 11/04/2022 11:15:41 -------------------------------------------------------------------------------- Vitals Details Patient Name: Date of Service: [name], [place] 11/04/2022 11:00 A M Medical Record Number: [id number] Patient Account Number: [id number] Date of Birth/Sex: Treating RN: 11-29-1975 (47 y.o. [name] Primary Care Rossy Virag: [name] Other Clinician: Referring Siraj Dermody: Treating Carmellia Kreisler/Extender: [name] in Treatment: 120 Vital Signs Time Taken: 11:11 Temperature (F): 97.9 Height (in): 69 Pulse (bpm): 73 Weight (lbs): 280 Respiratory Rate (breaths/min): 18 Body Mass Index (BMI): 41.3 Blood Pressure (mmHg): 147/90 Reference Range: 80 - 120 mg / dl Electronic  Signature(s) Signed: 11/04/2022 4:41:04 PM By: [name] Entered By: [name] on 11/04/2022 11:11:13

## 2022-11-07 IMAGING — MR MR FOOT*R* WO/W CM
9 series · 40 of 40 positions shown · IV contrast (gadavist)
Comparison: X-ray 08/13/2020

CLINICAL DATA: Nonhealing right heel wound

EXAM:
MRI OF THE RIGHT FOOT WITHOUT AND WITH CONTRAST
TECHNIQUE: Multiplanar, multisequence MR imaging of the right hindfoot was
performed before and after the administration of intravenous
contrast.
CONTRAST:  10mL GADAVIST GADOBUTROL 1 MMOL/ML IV SOLN

[Series 6: STIR · sagittal · right · 3.0mm · 0.29mm/px · 4 of 25 slices shown]
[im 1/25]
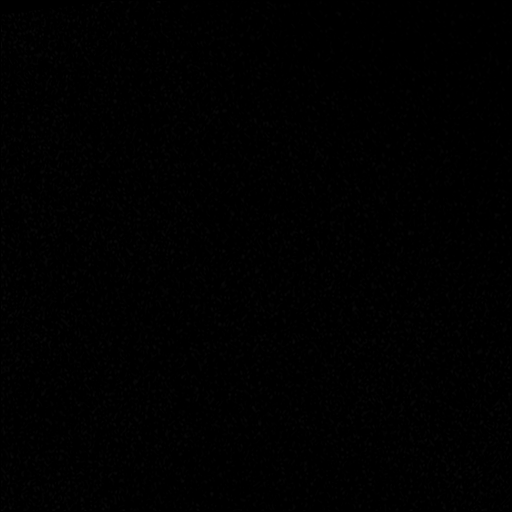
[im 9/25]
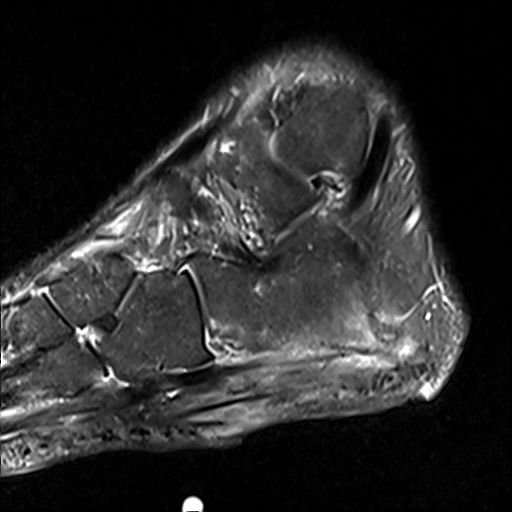
[im 17/25]
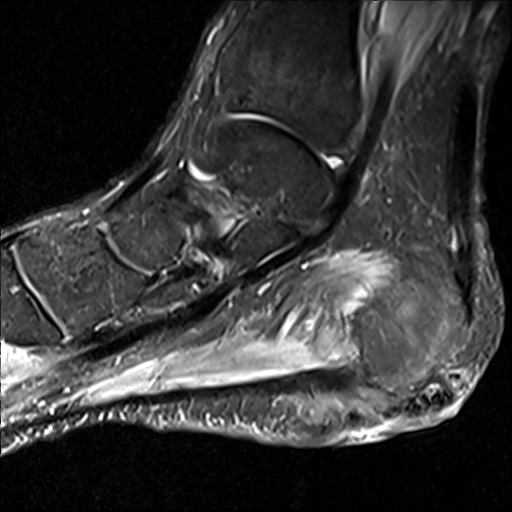
[im 25/25]
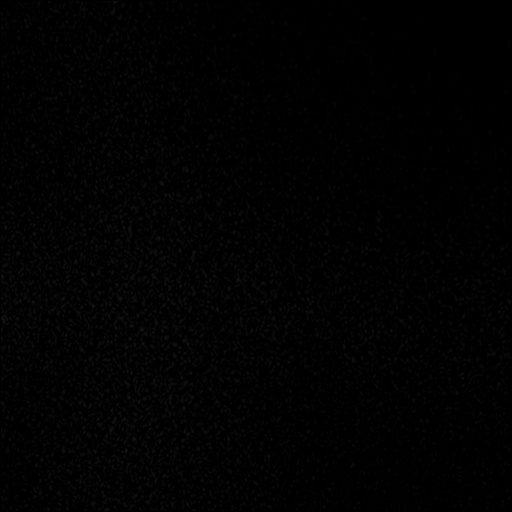

[Series 7: T2 fat-sat · oblique · right · 3.0mm · 0.59mm/px · 3 of 25 slices shown (1 of 2)]
[im 1/25]
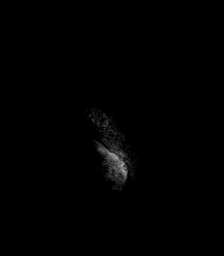
[im 13/25]
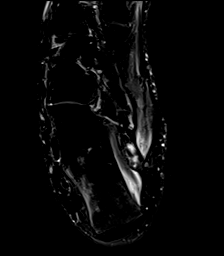
[im 25/25]
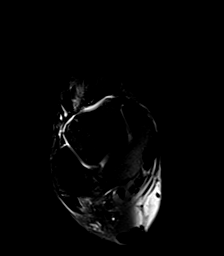

[Series 9: T1 · oblique · right · 3.0mm · 0.59mm/px · 3 of 25 slices shown (1 of 2)]
[im 1/25]
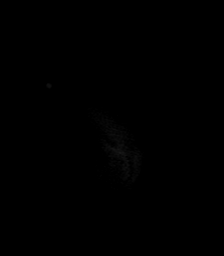
[im 13/25]
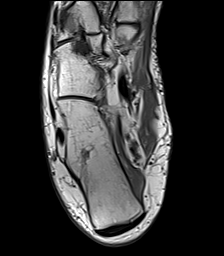
[im 25/25]
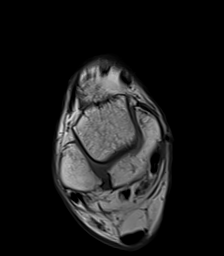

[Series 10: T1 · oblique · right · 3.0mm · 0.47mm/px · 6 of 43 slices shown (2 of 2)]
[im 1/43]
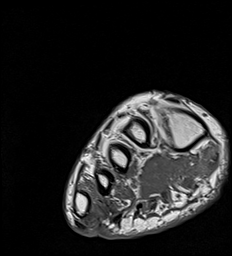
[im 9/43]
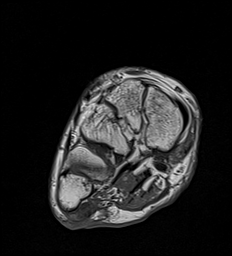
[im 17/43]
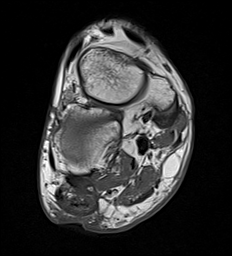
[im 26/43]
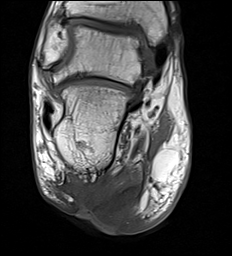
[im 34/43]
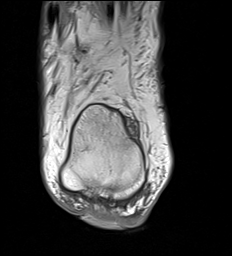
[im 43/43]
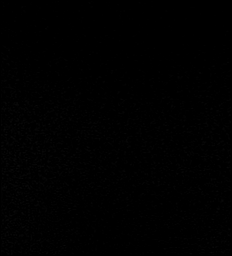

[Series 11: T2 fat-sat · oblique · right · 3.0mm · 0.38mm/px · 6 of 43 slices shown (2 of 2)]
[im 1/43]
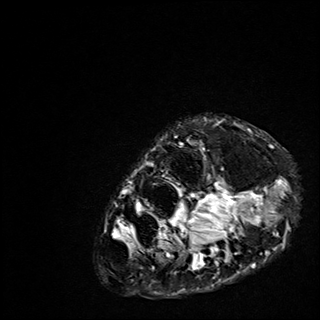
[im 9/43]
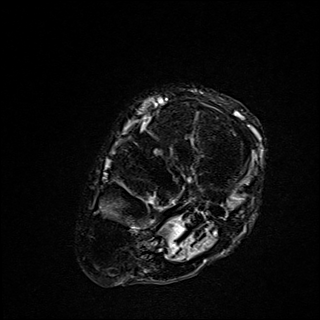
[im 17/43]
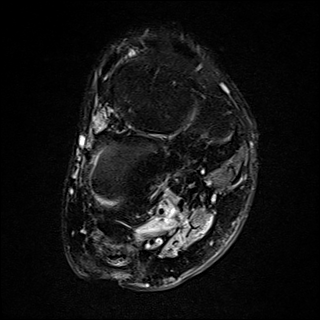
[im 26/43]
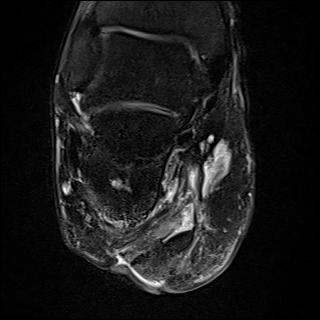
[im 34/43]
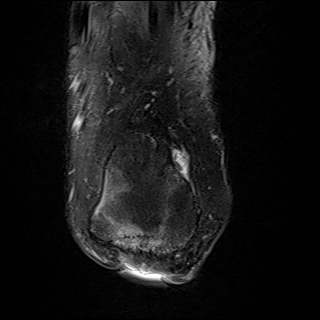
[im 43/43]
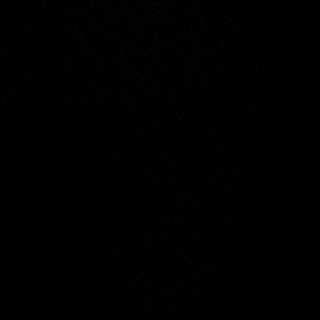

[Series 12: T1 fat-sat · oblique · non-contrast · right · 3.0mm · 0.47mm/px · 6 of 43 slices shown]
[im 1/43]
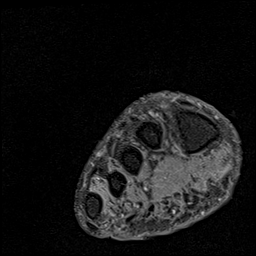
[im 9/43]
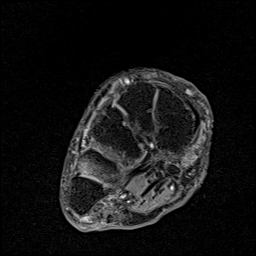
[im 17/43]
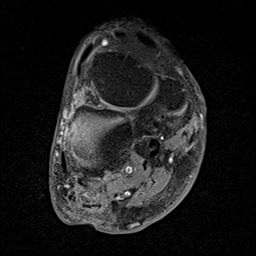
[im 26/43]
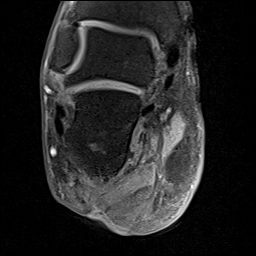
[im 34/43]
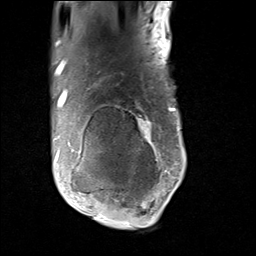
[im 43/43]
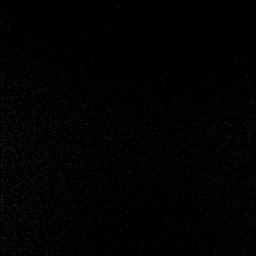

[Series 13: T1 fat-sat post-contrast · oblique · right · 3.0mm · 0.47mm/px · 6 of 43 slices shown (1 of 3)]
[im 1/43]
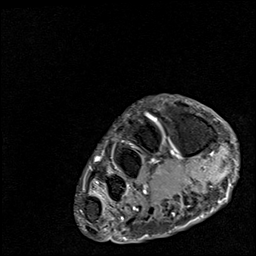
[im 9/43]
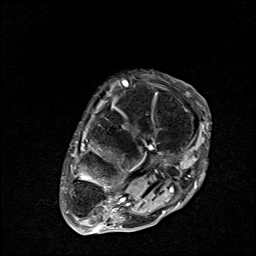
[im 17/43]
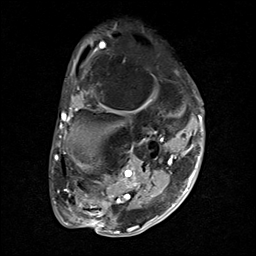
[im 26/43]
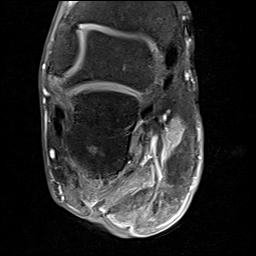
[im 34/43]
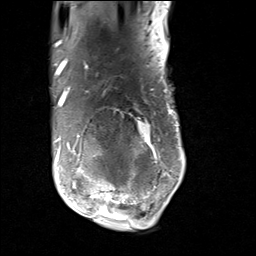
[im 43/43]
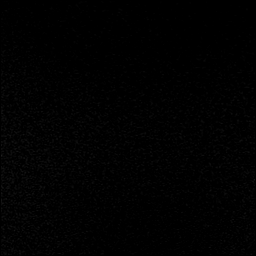

[Series 14: T1 fat-sat post-contrast · sagittal · right · 3.0mm · 0.29mm/px · 3 of 23 slices shown (2 of 3)]
[im 1/23]
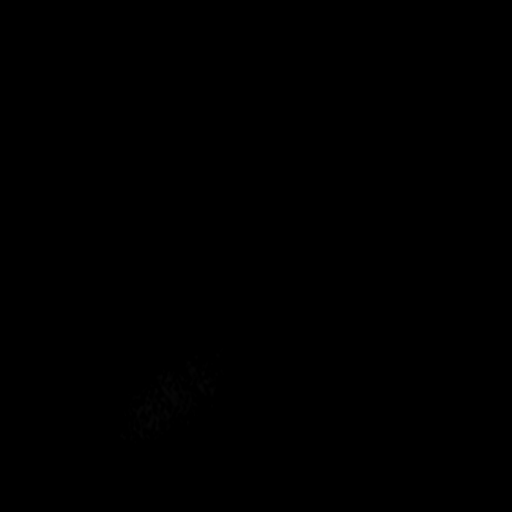
[im 12/23]
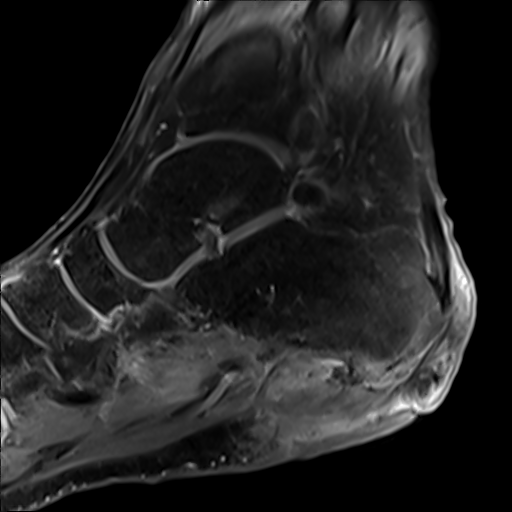
[im 23/23]
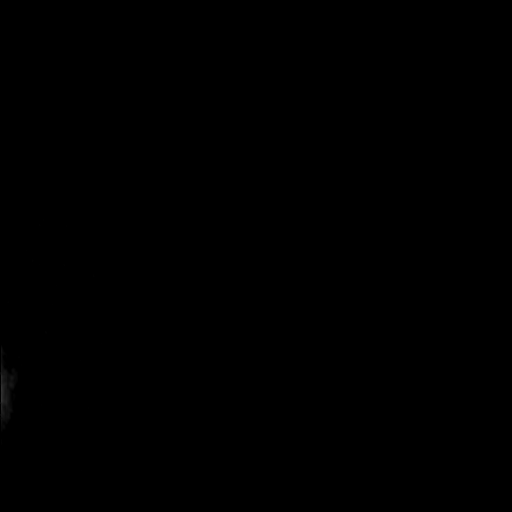

[Series 15: T1 fat-sat post-contrast · oblique · right · 3.0mm · 0.47mm/px · 3 of 25 slices shown (3 of 3)]
[im 1/25]
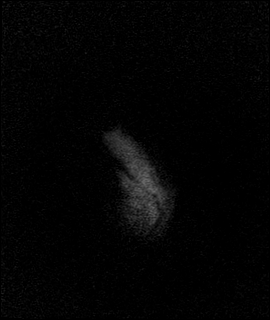
[im 13/25]
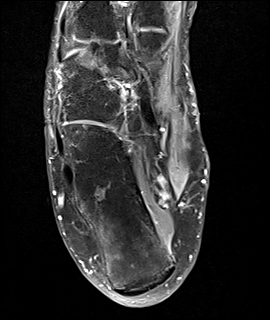
[im 25/25]
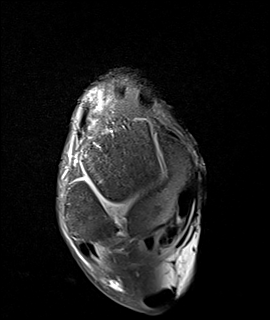

[40 of 40 positions shown; findings below may reference images not displayed]

FINDINGS: Large plantar soft tissue ulceration underlying the calcaneal
tuberosity and body measuring approximately 4.0 x 3.0 cm. Underlying
and surrounding soft tissue thickening and edema compatible with
cellulitis. No organized or rim enhancing fluid collection.
Posterior aspect of the ulcer base closely approximates the plantar
calcaneal enthesophyte at the level of the plantar fascia
attachment. There is prominent bone marrow edema and enhancement
within the plantar and lateral aspects of the calcaneal tuberosity.
Subtle patchy T1 intermediate signal within this region without
confluent low T1 marrow signal change. Findings are favored to
represent early acute osteomyelitis. There is slight fragmentation
of the plantar calcaneal enthesophyte. Otherwise, no evidence of
significant bony erosion or destruction.

Remaining osseous structures are within normal limits. No acute
fracture or dislocation. Scattered mild degenerative changes most
pronounced at the fourth tarsometatarsal joint. Tibiotalar and
subtalar joint spaces are maintained. No joint effusion.

Prominent intramuscular edema within the plantar foot musculature,
most notably within the abductor hallucis and flexor digitorum
brevis muscles suggesting a nonspecific myositis. No evidence of
myonecrosis. Mild distal Achilles tendinosis. Tendinous structures
are otherwise within normal limits. No tenosynovitis. There is edema
and enhancement in the region of the distal posterior tibial vein at
the level of the medial malleolus and plantar hindfoot (series 15,
images 9-15), which may reflect a superficial thrombophlebitis.
IMPRESSION: 1. Large plantar soft tissue ulceration underlying the posterior
calcaneus with surrounding cellulitis. No organized or rim enhancing
fluid collection.
2. Prominent bone marrow edema and enhancement within the plantar
and lateral aspects of the calcaneal tuberosity favored to represent
early acute osteomyelitis.
3. Prominent intramuscular edema within the plantar foot musculature
suggesting a nonspecific myositis.
4. Edema and enhancement in the region of the distal posterior
tibial vein at the level of the medial malleolus and plantar
hindfoot, which may reflect a superficial thrombophlebitis.
5. Mild distal Achilles tendinosis.

These results will be called to the ordering clinician or
representative by the Radiologist Assistant, and communication
documented in the PACS or [REDACTED].

## 2022-11-10 ENCOUNTER — Encounter (HOSPITAL_BASED_OUTPATIENT_CLINIC_OR_DEPARTMENT_OTHER): Payer: PPO | Attending: General Surgery | Admitting: General Surgery

## 2022-11-10 DIAGNOSIS — I10 Essential (primary) hypertension: Secondary | ICD-10-CM | POA: Diagnosis not present

## 2022-11-10 DIAGNOSIS — L97512 Non-pressure chronic ulcer of other part of right foot with fat layer exposed: Secondary | ICD-10-CM | POA: Diagnosis not present

## 2022-11-10 DIAGNOSIS — L97522 Non-pressure chronic ulcer of other part of left foot with fat layer exposed: Secondary | ICD-10-CM | POA: Diagnosis not present

## 2022-11-10 DIAGNOSIS — J45909 Unspecified asthma, uncomplicated: Secondary | ICD-10-CM | POA: Insufficient documentation

## 2022-11-10 DIAGNOSIS — L89623 Pressure ulcer of left heel, stage 3: Secondary | ICD-10-CM | POA: Diagnosis not present

## 2022-11-10 NOTE — Progress Notes (Signed)
RANNY, HERTENSTEIN ([id number]) [id number] Page 1 of 7 Visit Report for 11/10/2022 Arrival Information Details Patient Name: Date of Service: [name], [name] 11/10/2022 10:45 A M Medical Record Number: [id number] Patient Account Number: [id number] Date of Birth/Sex: Treating RN: 1975/12/24 (47 y.o. [name] Primary Care [name]: [name] Other Clinician: Referring [name]: Treating [name]/Extender: [name] in Treatment: 53 Visit Information History Since Last Visit Added or deleted any medications: No Patient Arrived: Wheel Chair Any new allergies or adverse reactions: No Arrival Time: 10:36 Had a fall or experienced change in No Accompanied By: son activities of daily living that may affect Transfer Assistance: Manual risk of falls: Patient Requires Transmission-Based Precautions: No Signs or symptoms of abuse/neglect since last visito No Patient Has Alerts: No Hospitalized since last visit: No Implantable device outside of the clinic excluding No cellular tissue based products placed in the center since last visit: Has Dressing in Place as Prescribed: Yes Pain Present Now: Yes Electronic Signature(s) Signed: 11/10/2022 1:04:54 PM By: [name] Entered By: [name] on 11/10/2022 10:39:49 -------------------------------------------------------------------------------- Encounter Discharge Information Details Patient Name: Date of Service: [name], [name] 11/10/2022 10:45 A M Medical Record Number: [id number] Patient Account Number: [id number] Date of Birth/Sex: Treating RN: October 26, 1975 (47 y.o. [name] Primary Care [name]: [name] Other Clinician: Referring [name]: Treating [name]/Extender: [name] in Treatment: 121 Encounter Discharge Information Items Post Procedure Vitals Discharge Condition: Stable Temperature (F): 98.6 Ambulatory  Status: Wheelchair Pulse (bpm): 80 Discharge Destination: Home Respiratory Rate (breaths/min): 16 Transportation: Private Auto Blood Pressure (mmHg): 167/82 Accompanied By: son Schedule Follow-up Appointment: Yes Clinical Summary of Care: Patient Declined Electronic Signature(s) Signed: 11/10/2022 1:04:54 PM By: [name] Entered By: [name] on 11/10/2022 11:13:40 -------------------------------------------------------------------------------- Lower Extremity Assessment Details Patient Name: Date of Service: [name], [name] 11/10/2022 10:45 A M Medical Record Number: [id number] Patient Account Number: [id number] Date of Birth/Sex: Treating RN: 05-18-1976 (47 y.o. [name] Primary Care [name]: [name] Other Clinician: Referring Makynleigh Breslin: Treating [name]/Extender: [name] in Treatment: 121 Edema Assessment Assessed: [name]: No] [name]: No] G[LeftBALEY, MCQUAIN [name] [Right: [id number] Page 2 of 7] Edema: [Left: N] [Right: o] Calf Left: Right: Point of Measurement: 29 cm From Medial Instep 44 cm Ankle Left: Right: Point of Measurement: 9 cm From Medial Instep 26 cm Electronic Signature(s) Signed: 11/10/2022 1:04:54 PM By: [name] Entered By: [name] on 11/10/2022 10:40:38 -------------------------------------------------------------------------------- Multi Wound Chart Details Patient Name: Date of Service: [name], [name] [place] E. 11/10/2022 10:45 A M Medical Record Number: [id number] Patient Account Number: [id number] Date of Birth/Sex: Treating RN: August 01, 1976 (47 y.o. M) Primary Care [name]: [name] Other Clinician: Referring [name]: Treating [name]/Extender: [name] in Treatment: 121 Vital Signs Height(in): 69 Pulse(bpm): 80 Weight(lbs): 280 Blood Pressure(mmHg): 167/82 Body Mass Index(BMI): 41.3 Temperature(F):  98.6 Respiratory Rate(breaths/min): 18 [2:Photos:] [N/A:N/A] Left Calcaneus N/A N/A Wound Location: Pressure Injury N/A N/A Wounding Event: Pressure Ulcer N/A N/A Primary Etiology: Asthma, Angina, Hypertension N/A N/A Comorbid History: 10/07/2019 N/A N/A Date Acquired: 121 N/A N/A Weeks of Treatment: Open N/A N/A Wound Status: No N/A N/A Wound Recurrence: 4.2x1.5x0.1 N/A N/A Measurements L x W x D (cm) 4.948 N/A N/A A (cm) : rea 0.495 N/A N/A Volume (cm) : 81.70% N/A N/A % Reduction in A rea: 98.20% N/A N/A % Reduction in Volume: Category/Stage III N/A N/A Classification: Medium N/A N/A Exudate  A mount: Serosanguineous N/A N/A Exudate Type: red, brown N/A N/A Exudate Color: Thickened N/A N/A Wound Margin: Medium (34-66%) N/A N/A Granulation A mount: Red, Pink N/A N/A Granulation Quality: Medium (34-66%) N/A N/A Necrotic A mount: Eschar, Adherent Slough N/A N/A Necrotic Tissue: Fat Layer (Subcutaneous Tissue): Yes N/A N/A Exposed Structures: Fascia: No Tendon: No Muscle: No Joint: No Bone: No Medium (34-66%) N/A N/A Epithelialization: Debridement - Excisional N/A N/A Debridement: Pre-procedure Verification/Time Out 10:52 N/A N/A Taken: [name] ([name]) [id number] Page 3 of 7 Lidocaine 4% Topical Solution N/A N/A Pain Control: Subcutaneous, Slough N/A N/A Tissue Debrided: Skin/Subcutaneous Tissue N/A N/A Level: 6.3 N/A N/A Debridement A (sq cm): rea Curette N/A N/A Instrument: Minimum N/A N/A Bleeding: Pressure N/A N/A Hemostasis A chieved: Procedure was tolerated well N/A N/A Debridement Treatment Response: 4.2x1.5x0.1 N/A N/A Post Debridement Measurements L x W x D (cm) 0.495 N/A N/A Post Debridement Volume: (cm) Category/Stage III N/A N/A Post Debridement Stage: Callus: Yes N/A N/A Periwound Skin Texture: Scarring: Yes Maceration: Yes N/A N/A Periwound Skin Moisture: Dry/Scaly: No No  Abnormalities Noted N/A N/A Periwound Skin Color: No Abnormality N/A N/A Temperature: Debridement N/A N/A Procedures Performed: Treatment Notes Wound #2 (Calcaneus) Wound Laterality: Left Cleanser Normal Saline Discharge Instruction: Cleanse the wound with Normal Saline prior [name] applying a clean dressing using gauze sponges, not tissue or cotton balls. Wound Cleanser Discharge Instruction: Cleanse the wound with wound cleanser prior [name] applying a clean dressing using gauze sponges, not tissue or cotton balls. Peri-Wound Care Topical compounding topical antibiotics from [facility] Discharge Instruction: apply directly [name] wound bed. (KEYSTONE) Primary Dressing Endoform 2x2 in Discharge Instruction: Moisten with saline or Hydrogel Secondary Dressing Drawtex 4x4 in Discharge Instruction: Apply over primary dressing as directed. Optifoam Non-Adhesive Dressing, 4x4 in Discharge Instruction: Apply over primary dressing as directed. Woven Gauze Sponge, Non-Sterile 4x4 in Discharge Instruction: Apply over primary dressing as directed. Secured With 23M Medipore H Soft Cloth Surgical T ape, 4 x 10 (in/yd) Discharge Instruction: Secure with tape as directed. Compression Wrap Kerlix Roll 4.5x3.1 (in/yd) Discharge Instruction: Apply Kerlix and Coban compression as directed. Compression Stockings Add-Ons Electronic Signature(s) Signed: 11/10/2022 12:17:01 PM By: Fredirick Maudlin MD FACS Entered By: Fredirick Maudlin on 11/10/2022 12:17:01 -------------------------------------------------------------------------------- Multi-Disciplinary Care Plan Details Patient Name: Date of Service: [name], [place]. 11/10/2022 10:45 A M Medical Record Number: [name] Patient Account Number: [id number] Date of Birth/Sex: Treating RN: 12-Dec-1975 (47 y.o. [name] Primary Care Dinna Severs: [name] Other Clinician: Referring Celsey Asselin: Treating Demere Dotzler/Extender: Dearon, Dema ([name]) [id number] Page 4 of 7 Weeks in Treatment: [place] reviewed with physician Active Inactive Wound/Skin Impairment Nursing Diagnoses: Impaired tissue integrity Knowledge deficit related [name] ulceration/compromised skin integrity Goals: Patient/caregiver will verbalize understanding of skin care regimen Date Initiated: 07/15/2020 Target Resolution Date: 03/08/2023 Goal Status: Active Ulcer/skin breakdown will have a volume reduction of 30% by week 4 Date Initiated: 07/15/2020 Date Inactivated: 08/20/2020 Target Resolution Date: 09/03/2020 Goal Status: Unmet Unmet Reason: no major changes. Ulcer/skin breakdown will heal within 14 weeks Date Initiated: 12/04/2020 Date Inactivated: 12/10/2020 Target Resolution Date: 12/10/2020 Unmet Reason: wounds still open at 14 Goal Status: Unmet weeks and today 21 weeks. Interventions: Assess patient/caregiver ability [name] obtain necessary supplies Assess patient/caregiver ability [name] perform ulcer/skin care regimen upon admission and as needed Assess ulceration(s) every visit Provide education on ulcer and skin care Treatment Activities: Skin care regimen initiated : 07/15/2020 Topical wound management  initiated : 07/15/2020 Notes: Electronic Signature(s) Signed: 11/10/2022 1:04:54 PM By: [name] Entered By: [name] on 11/10/2022 11:12:32 -------------------------------------------------------------------------------- Pain Assessment Details Patient Name: Date of Service: [name], [name] [place] E. 11/10/2022 10:45 A M Medical Record Number: [name] Patient Account Number: [id number] Date of Birth/Sex: Treating RN: [birth year] (47 y.o. [name] Primary Care Jendaya Gossett: [name] Other Clinician: Referring Mahek Schlesinger: Treating Tedd Cottrill/Extender: [name] in Treatment: 121 Active Problems Location of Pain Severity and  Description of Pain Patient Has Paino Yes Site Locations Pain Location: Pain in Ulcers Duration of the Pain. Constant / Intermittento Constant Rate the pain. Current Pain Level: 6 [name], [name] ([name]) [id number] Page 5 of 7 Character of Pain Describe the Pain: Aching Pain Management and Medication Current Pain Management: Electronic Signature(s) Signed: 11/10/2022 1:04:54 PM By: [name] Entered By: [name] on 11/10/2022 10:40:35 -------------------------------------------------------------------------------- Patient/Caregiver Education Details Patient Name: Date of Service: GO URLEY, [name] [place] E. 4/4/2024andnbsp10:45 A M Medical Record Number: [name] Patient Account Number: [id number] Date of Birth/Gender: Treating RN: [birth year] (47 y.o. [name] Primary Care Physician: [name] Other Clinician: Referring Physician: Treating Physician/Extender: [name] in Treatment: 121 Education Assessment Education Provided [name]: Patient Education Topics Provided Wound/Skin Impairment: Methods: Explain/Verbal Responses: Reinforcements needed, State content correctly Electronic Signature(s) Signed: 11/10/2022 1:04:54 PM By: [name] Entered By: [name] on 11/10/2022 11:12:44 -------------------------------------------------------------------------------- Wound Assessment Details Patient Name: Date of Service: [name], [name] [place] E. 11/10/2022 10:45 A M Medical Record Number: [name] Patient Account Number: [id number] Date of Birth/Sex: Treating RN: [birth year] (47 y.o. [name] Primary Care Daymond Cordts: [name] Other Clinician: Referring Jarreau Callanan: Treating Ramsay Bognar/Extender: [name] in Treatment: 121 Wound Status Wound Number: 2 Primary Etiology: Pressure Ulcer Wound Location: Left Calcaneus Wound Status: Open Wounding Event:  Pressure Injury Comorbid History: Asthma, Angina, Hypertension Date Acquired: 10/07/2019 Weeks Of Treatment: 121 Clustered Wound: No Photos [name], [name] ([name]) [id number] Page 6 of 7 Wound Measurements Length: (cm) 4.2 Width: (cm) 1.5 Depth: (cm) 0.1 Area: (cm) 4.948 Volume: (cm) 0.495 % Reduction in Area: 81.7% % Reduction in Volume: 98.2% Epithelialization: Medium (34-66%) Tunneling: No Undermining: No Wound Description Classification: Category/Stage III Wound Margin: Thickened Exudate Amount: Medium Exudate Type: Serosanguineous Exudate Color: red, brown Foul Odor After Cleansing: No Slough/Fibrino Yes Wound Bed Granulation Amount: Medium (34-66%) Exposed Structure Granulation Quality: Red, Pink Fascia Exposed: No Necrotic Amount: Medium (34-66%) Fat Layer (Subcutaneous Tissue) Exposed: Yes Necrotic Quality: Eschar, Adherent Slough Tendon Exposed: No Muscle Exposed: No Joint Exposed: No Bone Exposed: No Periwound Skin Texture Texture Color No Abnormalities Noted: No No Abnormalities Noted: Yes Callus: Yes Temperature / Pain Scarring: Yes Temperature: No Abnormality Moisture No Abnormalities Noted: No Dry / Scaly: No Maceration: Yes Treatment Notes Wound #2 (Calcaneus) Wound Laterality: Left Cleanser Normal Saline Discharge Instruction: Cleanse the wound with Normal Saline prior [name] applying a clean dressing using gauze sponges, not tissue or cotton balls. Wound Cleanser Discharge Instruction: Cleanse the wound with wound cleanser prior [name] applying a clean dressing using gauze sponges, not tissue or cotton balls. Peri-Wound Care Topical compounding topical antibiotics from [facility] Discharge Instruction: apply directly [name] wound bed. (KEYSTONE) Primary Dressing Endoform 2x2 in Discharge Instruction: Moisten with saline or Hydrogel Secondary Dressing Drawtex 4x4 in Discharge Instruction: Apply over primary  dressing as directed. Optifoam Non-Adhesive Dressing, 4x4 in Discharge Instruction: Apply over primary dressing as directed. Woven Gauze Sponge, Non-Sterile 4x4 in Discharge Instruction:  Apply over primary dressing as directed. [name], [name] ([id number]) [id number] Page 7 of 7 Secured With 22M Medipore H Soft Cloth Surgical T ape, 4 x 10 (in/yd) Discharge Instruction: Secure with tape as directed. Compression Wrap Kerlix Roll 4.5x3.1 (in/yd) Discharge Instruction: Apply Kerlix and Coban compression as directed. Compression Stockings Add-Ons Electronic Signature(s) Signed: 11/10/2022 1:04:54 PM By: [name] Entered By: [name] on 11/10/2022 11:13:01 -------------------------------------------------------------------------------- Vitals Details Patient Name: Date of Service: [name], [place] E. 11/10/2022 10:45 A M Medical Record Number: [id number] Patient Account Number: [id number] Date of Birth/Sex: Treating RN: 1976/04/22 (47 y.o. [name] Primary Care Kraig Genis: [name] Other Clinician: Referring Tiberius Loftus: Treating Gonsalo Cuthbertson/Extender: [name] in Treatment: 121 Vital Signs Time Taken: 10:40 Temperature (F): 98.6 Height (in): 69 Pulse (bpm): 80 Weight (lbs): 280 Respiratory Rate (breaths/min): 18 Body Mass Index (BMI): 41.3 Blood Pressure (mmHg): 167/82 Reference Range: 80 - 120 mg / dl Electronic Signature(s) Signed: 11/10/2022 1:04:54 PM By: [name] Entered By: [name] on 11/10/2022 10:41:04

## 2022-11-10 NOTE — Progress Notes (Signed)
[place], SUHY ([id number]) [id number] Page 1 of 15 Visit Report for 11/10/2022 Chief Complaint Document Details Patient Name: Date of Service: [name], [place]. 11/10/2022 10:45 A M Medical Record Number: [id number] Patient Account Number: [id number] Date of Birth/Sex: Treating RN: 24-Jun-1976 (47 y.o. M) Primary Care Provider: [name] Other Clinician: Referring Provider: Treating Provider/Extender: [name] in Treatment: [place] from: Patient Chief Complaint Bilateral Plantar Foot Ulcers Electronic Signature(s) Signed: 11/10/2022 12:17:25 PM By: Fredirick Maudlin MD FACS Entered By: Fredirick Maudlin on 11/10/2022 12:17:24 -------------------------------------------------------------------------------- Debridement Details Patient Name: Date of Service: [name], [name] 11/10/2022 10:45 A M Medical Record Number: [id number] Patient Account Number: [id number] Date of Birth/Sex: Treating RN: 09-22-1975 (47 y.o. [name] Primary Care Provider: [name] Other Clinician: Referring Provider: Treating Provider/Extender: [name] in Treatment: 121 Debridement Performed for Assessment: Wound #2 Left Calcaneus Performed By: Physician Fredirick Maudlin, MD Debridement Type: Debridement Level of Consciousness (Pre-procedure): Awake and Alert Pre-procedure Verification/Time Out Yes - 10:52 Taken: Start Time: 10:52 Pain Control: Lidocaine 4% T opical Solution T Area Debrided (L x W): otal 4.2 (cm) x 1.5 (cm) = 6.3 (cm) Tissue and other material debrided: Non-Viable, Slough, Subcutaneous, Skin: Epidermis, Slough Level: Skin/Subcutaneous Tissue Debridement Description: Excisional Instrument: Curette Bleeding: Minimum Hemostasis Achieved: Pressure Response to Treatment: Procedure was tolerated well Level of Consciousness (Post- Awake and Alert procedure): Post Debridement  Measurements of Total Wound Length: (cm) 4.2 Stage: Category/Stage III Width: (cm) 1.5 Depth: (cm) 0.1 Volume: (cm) 0.495 Character of Wound/Ulcer Post Debridement: Improved Post Procedure Diagnosis Same as Pre-procedure Notes scribed for Dr. [name] by Adline Peals, RN Electronic Signature(s) Signed: 11/10/2022 12:44:03 PM By: Fredirick Maudlin MD FACS Signed: 11/10/2022 1:04:54 PM By: Adline Peals Entered By: Adline Peals on 11/10/2022 10:55:26 [name] ([id number]) [id number] Page 2 of 15 -------------------------------------------------------------------------------- HPI Details Patient Name: Date of Service: [name] [name] 11/10/2022 10:45 A M Medical Record Number: [id number] Patient Account Number: [id number] Date of Birth/Sex: Treating RN: 08/20/1975 (47 y.o. M) Primary Care Provider: [name] Other Clinician: Referring Provider: Treating Provider/Extender: [name] in Treatment: 121 History of Present Illness HPI Description: Wounds are12/03/2020 upon evaluation today patient presents for initial inspection here in our clinic concerning issues he has been having with the bottoms of his feet bilaterally. He states these actually occurred as wounds when he was hospitalized for 5 months secondary to Covid. He was apparently with tilting bed where he was in an upright position quite frequently and apparently this occurred in some way shape or form during that time. Fortunately there is no sign of active infection at this time. No fevers, chills, nausea, vomiting, or diarrhea. With that being said he still has substantial wounds on the plantar aspects of his feet Theragen require quite a bit of work to get these to heal. He has been using Santyl currently though that is been problematic both in receiving the medication as well as actually paid for it as it is become quite expensive. Prior to the  experience with Covid the patient really did not have any major medical problems other than hypertension he does have some mild generalized weakness following the Covid experience. 07/22/2020 on evaluation today patient appears to be doing okay in regard to his foot ulcers I feel like the wound beds are showing signs of better improvement that I do believe the Iodoflex is helping in this regard. With that  being said he does have a lot of drainage currently and this is somewhat blue/green in nature which is consistent with Pseudomonas. I do think a culture today would be appropriate for [place] to evaluate and see if that is indeed the case I would likely start him on antibiotic orally as well he is not allergic to Cipro knows of no issues he has had in the past 12/21; patient was admitted to the clinic earlier this month with bilateral presumed pressure ulcers on the bottom of his feet apparently related to excessive pressure from a tilt table arrangement in the intensive care unit. Patient relates this to being on ECMO but I am not really sure that is exactly related to that. I must say I have never seen anything like this. He has fairly extensive full-thickness wounds extending from his heel towards his midfoot mostly centered laterally. There is already been some healing distally. He does not appear to have an arterial issue. He has been using gentamicin to the wound surfaces with Iodoflex to help with ongoing debridement 1/6; this is a patient with pressure ulcers on the bottom of his feet related to excessive pressure from a standing position in the intensive care unit. He is complaining of a lot of pain in the right heel. He is not a diabetic. 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 We have been using Iodoflex to help prepare the surfaces of both wounds for an advanced treatment product. He is nonambulatory spending most of his time in a wheelchair I have asked him not to  propel the wheelchair with his heels 1/13; in general his wounds look better not much surface area change we have been using Iodoflex as of last week. I did an x-ray of the right heel as the patient was complaining of pain. I had some thoughts about a stress fracture perhaps Achilles tendon problems however what it showed was erosive changes along the inferior aspect of the calcaneus he now has a MRI booked for 1/20. 1/20; in general his wounds continue to be better. Some improvement in the large narrow areas proximally in his foot. He is still complaining of pain in the right heel and tenderness in certain areas of this wound. His MRI is tonight. I am not just looking for osteomyelitis that was brought up on the x-ray I am wondering about stress fractures, tendon ruptures etc. He has no such findings on the left. Also noteworthy is that the patient had critical illness neuropathy and some of the discomfort may be actual improvement in nerve function I am just not sure. These wounds were initially in the setting of severe critical illness related to COVID-19. He was put in a standing position. He may have also been on pressors at the point contributing to tissue ischemia. By his description at some point these wounds were grossly necrotic extending proximally up into the Achilles part of his heel. I do not know that I have ever really seen pictures of them like this although they may exist in epic We have ordered Tri layer Oasis. I am trying to stimulate some granulation in these areas. This is of course assuming the MRI is negative for infection 1/27; since the patient was last here he saw Dr. Juleen China of infectious disease. He is planned for vancomycin and ceftriaxone. Prior operative culture grew MSSA. Also ordered baseline lab work. He also ordered arterial studies although the ABIs in our clinic were normal as well as his  clinical exam these were normal I do not think he needs to see vascular  surgery. His ABIs at the [facility] were 1.22 in the right triphasic waveforms with a normal TBI of 1.15 on the left ABI of 1.22 with triphasic waveforms and a normal TBI of 1.08. Finally he saw Dr. Amalia Hailey who will follow him in for 2 months. At this point I do not think he felt that he needed a procedure on the right calcaneal bone. Dr. Juleen China is elected for broad-spectrum antibiotic The patient is still having pain in the right heel. He walks with a walker 2/3; wounds are generally smaller. He is tolerating his IV antibiotics. I believe this is vancomycin and ceftriaxone. We are still waiting for Oasis burn in terms of his out-of-pocket max which he should be meeting soon given the IV antibiotics, MRIs etc. I have asked him to check in on this. We are using silver collagen in the meantime the wounds look better 2/10; tolerating IV vancomycin and Rocephin. We are waiting to apply for Oasis. Although I am not really sure where he is in his out-of-pocket max. 2/17 started the first application of Oasis trilayer. Still on antibiotics. The wounds have generally look better. The area on the left has a little more surface slough requiring debridement [id number]; second application of Oasis trilayer. The wound surface granulation is generally look better. The area on the left with undermining laterally I think is come in a bit. 10/08/2020 upon evaluation today patient is here today for [employer] application #3. Fortunately he seems to be doing extremely well with regard to this and we are seeing a lot of new epithelial growth which is great news. Fortunately there is no signs of active infection at this time. 10/16/2020 upon evaluation today patient appears to be doing well with regard to his foot ulcers. Do believe the Oasis has been of benefit for him. I do not see any signs of infection right now which is great news and I think that he has a lot of new epithelial growth which is great to see as well. The patient  is very pleased to hear all of this. I do think we can proceed with the Oasis trilayer #4 today. 3/18; not as much improvement in these areas on his heels that I was hoping. I did reapply trilateral Oasis today the tissue looks healthier but not as much fill in as I was hoping. 3/25; better looking today I think this is come in a bit the tissue looks healthier. Triple layer Oasis reapplied #6 4/1; somewhat better looking definitely better looking surface not as much change in surface area as I was hoping. He may be spending more time [place] on days then he needs to although he does have heel offloading boots. Triple layer Oasis reapplied #7 4/7; unfortunately apparently [facility] will not approve any further Oasis which is unfortunate since the patient did respond nicely both in terms of the condition of the wound bed as well as surface area. There is still some drainage coming from the wound but not a lot there does not appear to be any infection 4/15; we have been using Hydrofera Blue. He continues to have nice rims of epithelialization on the right greater than the left. The left the epithelialization is coming from the tip of his heel. There is moderate drainage. In this that concerns me about a total contact cast. There is no evidence of infection 4/29; patient has been  using Hydrofera Blue with dressing changes. He has no complaints or issues today. [name], [name] ([id number]) [id number] Page 3 of 15 5/5; using Hydrofera Blue. I actually think that he looks marginally better than the last time I saw this 3 weeks ago. There are rims of epithelialization on the left thumb coming from the medial side on the right. Using Hydrofera Blue 5/12; using Hydrofera Blue. These continue to make improvements in surface area. His drainage was not listed as severe I therefore went ahead and put a cast on the left foot. Right foot we will continue to dress his  previous 5/16; back for first total contact cast change. He did not tolerate this particularly well cast injury on the anterior tibia among other issues. Difficulty sleeping. I talked him about this in some detail and afterwards is elected to continue. I told him I would like to have a cast on for 3 weeks to see if this is going to help at all. I think he agreed 5/19; I think the wound is better. There is no tunneling towards his midfoot. The undermining medially also looks better. He has a rim of new skin distally. I think we are making progress here. The area on the left also continues to look somewhat better to me using Hydrofera Blue. He has a list of complaints about the cast but none of them seem serious 5/26; patient presents for 1 week follow-up. He has been using a total contact cast and tolerating this well. Hydrofera Blue is the main dressing used. He denies signs of infection. 6/2 Hydrofera Blue total contact cast on the left. These were large ulcers that formed in intensive care unit where the patient was recovering from [place]. May have had something to do with being ventilated in an upright positiono Pressors etc. We have been able to get the areas down considerably and a viable surface. There is some epithelialization in both sides. Note made of drainage 6/9; changed to [facility] last time because of drainage. He arrives with better looking surfaces and dimensions on the left than the right. Paradoxically the right actually probes more towards his midfoot the left is largely close down but both of these look improved. Using a total contact cast on the left 6/16; complex wounds on his bilateral plantar heels which were initially pressure injury from a stay in the ICU with COVID. We have been using silver alginate most recently. His dimensions of come in quite dramatically however not recently. We have been putting the left foot in a total contact cast 6/23; complex wounds on the  bilateral plantar heels. I been putting the left in the cast paradoxically the area on the right is the one that is going towards closure at a faster rate. Quite a bit of drainage on the left. The patient went to see Dr. Amalia Hailey who said he was going to standby for skin grafts. I had actually considered sending him for skin grafts however he would be mandatorily off his feet for a period of weeks to months. I am thinking that the area on the right is going to close on its own the area on the left has been more stubborn even though we have him in a total contact cast 6/30; took him out of a total contact cast last week is the right heel seem to be making better progress than the left where I was placing the cast. We are using silver alginate. Both wounds are smaller right greater  than left 7/12; both wounds look as though they are making some progress. We are using silver alginate. Heel offloading boots 7/26; very gradual progress especially on the right. Using silver alginate. He is wearing heel offloading boots 8/18; he continues to close these wounds down very gradually. Using silver alginate. The problem polymen being definitive about this is areas of what appears to be callus around the margins. This is not a 100% of the area but certainly sizable especially on the right 9/1; bilateral plantar feet wounds secondary to prolonged pressure while being ventilated for COVID-19 in an upright position. Essentially pressure ulcers on the bottom of his feet. He is made substantial progress using silver alginate. 9/14; bilateral plantar feet wounds secondary to prolonged pressure. Making progress using silver alginate. 9/29 bilateral plantar feet wounds secondary to prolonged pressure. I changed him to Iodoflex last week. MolecuLight showing reddened blush fluorescence 10/11; patient presents for follow-up. He has no issues or complaints today. He denies signs of infection. He continues to use Iodoflex and  antibiotic ointment to the wound beds. 10/27; 2-week follow-up. No evidence of infection. He has callus and thick dry skin around the wound margins we have been using Iodoflex and Bactroban which was in response to a moderate left MolecuLight reddish blush fluorescence. 11/10; 2-week follow-up. Wound margins again have thick callus however the measurements of the actual wound sites are a lot smaller. Everything looks reasonably healthy here. We have been using Iodoflex He was approved for prime matrix but I have elected to delay this given the improvement in the surface area. Hopefully I will not regret that decision as were getting close to the end of the year in terms of insurance payment 12/8; 2-week follow-up. Wounds are generally smaller in size. These were initially substantial wounds extending into the forefoot all the way into the heel on the bilateral plantar feet. They are now both located on the plantar heel distal aspect both of these have a lot of callus around the wounds I used a #5 curette to remove this on the right and the left also some subcutaneous debris to try and get the wound edges were using Iodoflex. He has heel offloading shoe 12/22; 2-week follow-up. Not really much improvement. He has thick callus around the outer edges of both wounds. I remove this there is some nonviable subcutaneous tissue as well. We have been using Iodoflex. Her intake nurse and myself spontaneously thought of a total contact cast I went back in May. At that time we really were not seeing much of an improvement with a cast although the wound was in a much different situation I would like to retry this in 2 weeks and I discussed this with the patient 08/12/2021; the patient has had some improvement with the Iodoflex. The the area on the left heel plantar more improved than the right. I had to put him in a total contact cast on the left although I decided to put that off for 2 weeks. I am going to change  his primary dressing to silver collagen. I think in both areas he has had some improvement most of the healing seems to be more proximal in the heel. The wounds are in the mid aspect. A lot of thick callus on the right heel however. 1/19; we are using silver collagen on both plantar heel areas. He has had some improvement today. The left did not require any debridement. He still had some eschar on the right that was  debrided but both seem to have contracted. I did not put it total contact cast on him today 2/2 we have been using silver collagen. The area on the right plantar heel has areas that appear to be epithelialized interspersed with dry flaking callus and dry skin. I removed this. This really looks better than on the other side. On the left still a large area with raised edges and debris on the surface. 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 He has no issues or complaints today. 2/9; not much change in the left foot wound with 1 week of a cast we are using silver collagen. Silver collagen on the right side. The right side has been the better wound surface. We will reapply the total contact cast on the left 2/16; not much improvement on either side I been using silver collagen with a total contact cast on the left. I'm changing the Hydrofera Blue still with a total contact cast on the left 2/23; some improvement on both sides. Disappointing that he has thick callus around the area that we are putting in a total contact cast on the left. We've been using Hydrofera Blue on both wound areas. This is a man who at essentially pressure ulcers in addition to ischemia caused by medications to support his blood pressure (pressors) in the ICU. He was being ventilated in the standing position for severe Covid. A Shiley the wounds extended across his entire foot but are  now localized to his plantar heels bilaterally. We have made progress however neither areas healed. I continue to think the total contact cast is helped albeit painstakingly slowly. He has never wanted a plastic surgery consult although I don't know that they would be interested in grafting in area in this location. 10/07/2021: Continued improvement bilaterally. There is still some callus around the left wound, despite the total contact cast. He has some increased pain in his right midfoot around 1 particular area. This has been painful in the past but seems to be a little bit worse. When his cast was removed today, there was an area on the heel of the left foot that looks a bit macerated. He is also complaining of pain in his left thigh and hip which he thinks is secondary to the limb length discrepancy caused by the cast. 10/14/2021: He continues to improve. A little bit less callus around the left wound. He continues to endorse pain in his right midfoot, but this is not as significant as it was last week. The maceration on his left heel is improved. 10/21/2021: Continued improvement to both wounds. The maceration on his left heel is no longer evident. Less callus bilaterally. Epithelialization progressing. 10/28/2021: Significant improvement this week. The right sided wound is nearly closed with just a small open area at the middle. No maceration seen on the left heel. Continued epithelialization on both sides. No concern for infection. [name], [name] ([id number]) [id number] Page 4 of 15 11/04/2021: T oday, the wounds were measured a little bit differently and come out as larger, but I actually think they are about the same to potentially even smaller, particularly on the left. He continues to accumulate some callus on the right. 11/11/2021: T oday, the patient is expressing some concern that the left wound, despite being in the total contact cast, is not progressing at the same  rate  as the 1 on the right. He is interested in trying a week without the cast to see how the wound does. The wounds are roughly the same size as last week, with the right perhaps being a little bit smaller. He continues to build up callus on both sites. 11/18/2021: Last week, I permitted the patient to go without his total contact cast, just to see if the cast was really making any difference. Today, both wounds have deteriorated to some extent, suggesting that the cast is providing benefit, at least on the left. Both are larger and have accumulated callus, slough, and other debris. 11/26/2021: I debrided both wounds quite aggressively last week in an effort to stimulate the healing cascade. This appears to have been effective as the left sided wound is a full centimeter shorter in length. Although the right was measured slightly larger, on inspection, it looks as though an area of epithelialized tissue was included in the measurements. We have been using PolyMem Ag on the wound surfaces with a total contact cast to the left. 12/02/2021: It appears that the intake personnel are including epithelialized tissue in his wound measurements; the right wound is almost completely epithelialized; there is just a crater at the proximal midfoot with some open areas. On the left, he has built up some callus, but the overall wound surface looks good. There is some senescent skin around the wound margin. He has been in PolyMem Ag bilaterally with a total contact cast on the left. 12/09/2021: The right wound is nearly closed; there is just a small open area at the mid calcaneus. On the left, the wound is smaller with minimal callus buildup. No significant drainage. 12/16/2021: The right calcaneal wound remains minimally open at the mid calcaneus; the rest has epithelialized. On the left, the wound is also a little bit smaller. There is some senescent tissue on the periphery. He is getting his first application of a trial  skin substitute called Vendaje today. 12/23/2021: The wound on his right calcaneus is nearly closed; there is just a small area at the most distal aspect of the calcaneus that is open. On the left, the area where we applied to the skin substitute has a healthier-looking bed of granulation tissue. The wound dimensions are not significantly different on this side but the wound surface is improved. 12/30/2021: The wound on the right calcaneus has not changed significantly aside from some accumulation of callus. On the left, the open area is smaller and continues to have an improved surface. He continues to accumulate callus around the wound. He is here for his third application of Vendaje. 01/06/2022: The right calcaneal wound is down to just a couple of millimeters. He continues to accumulate periwound callus. He unfortunately got his cast wet earlier in the week and his left foot is macerated, resulting in some superficial skin loss just distal to the open wound. The open wound itself, however, is much smaller and has a healthier appearing surface. He is here for his fourth application of Vendaje. 01/13/2022: The right calcaneal wound is about the same. Unfortunately, once again, his cast got wet and his foot is again macerated. This is caused the left calcaneal wound to enlarge. He is here for his fifth application of Vendaje. 01/20/2022: The right calcaneal wound is very small. There is some periwound callus accumulation. He purchased a new cast protector last week and this has been effective in avoiding the maceration that has been occurring on the left. The left  calcaneal wound is narrower and has a healthy and viable-appearing surface. He is here for his 6 application of Vendaje. 01/27/2022: The right calcaneal wound is down to just a pinhole. There is some periwound slough and callus. On the left, the wound is narrower and shorter by about a centimeter. The surface is robust and viable-appearing.  Unfortunately, the rep for the trial skin substitute product did not provide any for [place] to use today. 02/04/2022: The right calcaneal wound remains unchanged. There is more accumulated callus. On the left, although the intake nurse measured it a little bit longer, it looks about the same to me. The surface has a layer of slough, but underneath this, there is good granulation tissue. 02/10/2022: The right calcaneus wound is nearly closed. There is still some callus that builds up around the site. The left side looks about the same in terms of dimensions, but the surface is more robust and vital-appearing. 02/16/2022: The area of the right calcaneus that was nearly closed last week has closed, but there is a small opening at the mid foot where it looks like some moisture got retained and caused some reopening. The left foot wound is narrower and shallower. Both sites have a fair amount of periwound callus and eschar. 02/24/2022: The small midfoot opening on the right calcaneus is a little bit smaller today. The left foot wound is narrower and shallower. He continues to accumulate periwound callus. No concern for infection. 03/01/2022: The patient came to clinic early because he showered and got his cast wet. Fortunately, there is no significant maceration to his foot but the callus softened and it looks like the wound on his left calcaneus may be a little bit wider. The wound on his right calcaneus is just a narrow slit. Continued accumulation of periwound callus bilaterally. 03/08/2022: The wound on his right calcaneus is very nearly closed, just a small pinpoint opening under a bit of eschar; the left wound has come in quite a bit, as well. It is narrower and shorter than at our last visit. Still with accumulated callus and eschar bilaterally. 03/17/2022: The right calcaneal wound is healed. The left wound is smaller and the surface itself is very clean, but there is some blue-green staining on the periwound  callus, concerning for Pseudomonas aeruginosa. 03/23/2022: The right calcaneal wound remains closed. The left wound continues to contract. No further blue-green staining. Small amount of callus and slough accumulation. 03/28/2022: He came in early today because he had gotten his cast wet. On inspection, the wound itself did not get wet or macerated, just a little bit of the forefoot. The wound itself is basically unchanged. 04/07/2022: The right foot wound remains closed. The left wound is the smallest that I have seen it to date. It is narrower and shorter. It still continues to accumulate slough on the surface. 04/15/2022: There is a band of epithelium now dividing the small left plantar foot wound in 2. There is still some slough on the surface. 04/21/2022: The wound continues to narrow. Just a little bit of slough on the surface. He seems to be responding well to endoform. 04/28/2022: Continued slow contraction of the wound. There is a little slough on the surface and some periwound callus. We have been using endoform and total contact cast. 05/05/2022: The wound appears to have stalled. There is slough and some periwound eschar/callus. No concern for infection, however. 05/12/2022: Unfortunately, his right foot has reopened. It is located at the most posterior aspect of  his surgical incision. The area was noted to have drainage coming from it when his padding was removed today. Underneath some callus and senescent skin, there is an opening. No purulent drainage or malodor. On the left foot, the wound is again unchanged. There is some light blue staining on the callus, but no malodor or purulent drainage. 10/13; right and left heel remanence of extensive plantar foot wounds. These are better than I remember by quite a big margin however he is still left with KAYLEM, BELTRE ([id number]) [phone].pdf Page 5 of 15 wounds on the left plantar heel and the right plantar heel. Been  using endoform bilaterally. A culture was done that showed apparently Pseudomonas but we are still waiting for the [name] antibiotic to use gentamicin today. He is still very active by description I am not sure about the offloading of his noncasted right foot 10/20; both wounds right and left heel debrided not much change from last week. [employer] has arrived which is linezolid, gentamicin and ciprofloxacin we will use this with endoform. T contact cast on the left otal 06/02/2022: Both wounds are smaller today. There is still a fair amount of callus buildup around the right foot ulcer. The left is more superficial and nearly flush with the surrounding tissues. Also with slough and eschar buildup. 06/10/2022: The right sided wound appears to be nearly closed, if not completely so, although it is somewhat difficult to tell given the abnormal tissue and scarring in his foot. There is a fair amount of callus and crust accumulation. On the left, the wound looks about the same, again with callus and slough. He has an appointment next Thursday with Dr. Loel Lofty in podiatry; I am hopeful that there may be some reconstructive options available for Mr. Ingram. 06/16/2022: Both wounds have some eschar and callus accumulation. The right sided wound is extremely narrow and barely open; the left is narrower than last week. There is a little bit of slough. He has his appointment in podiatry later today. 06/23/2022: The patient met with Dr. Loel Lofty last week and unfortunately, there are no reconstructive options that he believes would be helpful. He did order an MRI to evaluate for osteomyelitis and fortunately, none was seen. The left sided wound is a little bit shorter and narrower today. The right sided wound is about the same. There is callus and eschar accumulation bilaterally. 06/29/2022: Both feet have improved from last week. There is epithelium making a valiant effort to creep across the surface on  the left. The right side looks like it got a little dry and the deep crevasse in his midfoot has cracked. Both have eschar and there is some slough on the left. 07/07/2022: Both feet have improved. There is epithelium completely covering the calcaneus on the right with just a small opening in the crevasse in his midfoot. On the left, the open area of tissue is smaller but he continues to build up callus/eschar and slough. 07/15/2022: The opening in the midfoot on the right is about the same size, covered with eschar and a little bit of slough. The open portion of the left wound is narrower and shorter with a bit of slough buildup. He admits to being on his feet more than recommended. 12/14; as far as I can tell everything on the right foot is closed. There is some eschar I removed some of this I cannot identify any open wound here. As usual this will be a very vulnerable area going forward. On the  left this looks really quite healthy. I was pleasantly surprised to see how good this looked. Wound is certainly smaller and there appears to be healthy epithelialization. He has been using Promogran on the right and endoform on the left. He has been offloading the right foot with a heel offloading boot and he has a running shoe on the right foot 08/04/2022: The right foot remains closed. He has a thick cushioned insole in his sneaker. The left sided wound is smaller with just some slough and eschar accumulation. He is wearing the heel off loader on this foot. 08/15/2022: The right foot remains closed. The left wound has narrowed further. There is some slough and eschar accumulation. 08/25/2022: We put him in a peg assist shoe insert and as a result, he has more epithelialization of the ulcer with minimal slough and eschar accumulation. 09/01/2022: The wound is smaller by about half this week. Still with some slough on the surface. The peg assist shoe seems to be doing a remarkable job of adequately offloading  the site. 09/08/2022: There is a little bit more epithelium coming in. There is some slough and callus buildup. 09/16/2022: The wound measures about the same size, but the epithelium that has grown in looks more robust and stronger. There is some slough and callus buildup. 09/23/2022: The wound remains about the same size. The skin edges are looking rather senescent. 09/29/2022: The aggressive debridement I performed last week seems to have been effective. The wound is smaller and has significantly less slough accumulation. 10/06/2022: There has been quite a bit of epithelialization since last week. There are still some open areas with slough accumulation. There is some callus buildup around the perimeter. 10/13/2022: Continued improvement. Minimal callus accumulation. 3/14; patient presents for follow-up. He has been using endoform to the wound bed without issues. He is using a surgical shoe with peg assist for offloading. 10/27/2022: The wound dimensions are stable. There is some senescent skin accumulation around the perimeter. 11/04/2022: Last week I performed a very aggressive debridement in an effort to stimulate the healing cascade. As has been the case when I have done this before, the wound has responded well. There has been epithelialization and contraction of the wound. There are just a couple of small open areas. 11/10/2022: Unfortunately, his foot got wet secondary to sweating and there has been some breakdown of the tissue, particularly the skin just adjacent to the main ulcer. Electronic Signature(s) Signed: 11/10/2022 12:18:07 PM By: Fredirick Maudlin MD FACS Entered By: Fredirick Maudlin on 11/10/2022 12:18:06 -------------------------------------------------------------------------------- Physical Exam Details Patient Name: Date of Service: [name], [name] 11/10/2022 10:45 A M Medical Record Number: [id number] Patient Account Number: [id number] Date of Birth/Sex: Treating RN: 12-12-1975 (47  y.o. M) Primary Care Provider: [name] Other Clinician: Referring Provider: Treating Provider/Extender: [name] in Treatment: [address] ([id number]) [id number] Page 6 of 15 Constitutional Hypertensive, asymptomatic. . . . no acute distress. Respiratory Normal work of breathing on room air. Notes 11/10/2022: Unfortunately, his foot got wet secondary to sweating and there has been some breakdown of the tissue, particularly the skin just adjacent to the main ulcer. Electronic Signature(s) Signed: 11/10/2022 12:18:35 PM By: Fredirick Maudlin MD FACS Entered By: Fredirick Maudlin on 11/10/2022 12:18:34 -------------------------------------------------------------------------------- Physician Orders Details Patient Name: Date of Service: [name], [place]. 11/10/2022 10:45 A M Medical Record Number: [id number] Patient Account Number: [id number] Date of Birth/Sex: Treating RN: 1976-07-02 (47 y.o. [name] Primary  Care Provider: [name] Other Clinician: Referring Provider: Treating Provider/Extender: [name] in Treatment: [phone] Verbal / Phone Orders: No Diagnosis Coding ICD-10 Coding Code Description [phone] Non-pressure chronic ulcer of other part of left foot with fat layer exposed Follow-up Appointments ppointment in 1 week. - Dr. [name] Room 3 Return A Anesthetic Wound #2 Left Calcaneus (In clinic) Topical Lidocaine 4% applied to wound bed - In clinic Bathing/ Shower/ Hygiene May shower and wash wound with soap and water. Edema Control - Lymphedema / SCD / Other Bilateral Lower Extremities Avoid standing for long periods of time. Moisturize legs daily. - as needed Off-Loading Other: - keep pressure off of the bottom of your feet. Elevate legs throughout the day. Use the Shoe with the PegAssist off-loading insole Additional Orders / Instructions Follow Nutritious Diet -  Try to get 70-100 grams of Protein a day+ Wound Treatment Wound #2 - Calcaneus Wound Laterality: Left Cleanser: Normal Saline (Generic) Every Other Day/30 Days Discharge Instructions: Cleanse the wound with Normal Saline prior to applying a clean dressing using gauze sponges, not tissue or cotton balls. Cleanser: Wound Cleanser (Generic) Every Other Day/30 Days Discharge Instructions: Cleanse the wound with wound cleanser prior to applying a clean dressing using gauze sponges, not tissue or cotton balls. Topical: compounding topical antibiotics from [place] Every Other Day/30 Days Discharge Instructions: apply directly to wound bed. (KEYSTONE) Prim Dressing: Endoform 2x2 in (Generic) Every Other Day/30 Days ary Discharge Instructions: Moisten with saline or Hydrogel Secondary Dressing: Drawtex 4x4 in Every Other Day/30 Days Discharge Instructions: Apply over primary dressing as directed. Secondary Dressing: Optifoam Non-Adhesive Dressing, 4x4 in (Generic) Every Other Day/30 Days Discharge Instructions: Apply over primary dressing as directed. [name], [name] ([id number]) [id number] Page 7 of 15 Secondary Dressing: Woven Gauze Sponge, Non-Sterile 4x4 in (Generic) Every Other Day/30 Days Discharge Instructions: Apply over primary dressing as directed. Secured With: 42M Medipore H Soft Cloth Surgical T ape, 4 x 10 (in/yd) (Generic) Every Other Day/30 Days Discharge Instructions: Secure with tape as directed. Compression Wrap: Kerlix Roll 4.5x3.1 (in/yd) Every Other Day/30 Days Discharge Instructions: Apply Kerlix and Coban compression as directed. Patient Medications llergies: No Known Drug Allergies A Notifications Medication Indication Start End 11/10/2022 lidocaine DOSE topical 4 % cream - cream topical Electronic Signature(s) Signed: 11/10/2022 12:44:03 PM By: Fredirick Maudlin MD FACS Entered By: Fredirick Maudlin on 11/10/2022  12:19:57 -------------------------------------------------------------------------------- Problem List Details Patient Name: Date of Service: [name], [place] E. 11/10/2022 10:45 A M Medical Record Number: [id number] Patient Account Number: [id number] Date of Birth/Sex: Treating RN: 1975-10-03 (47 y.o. M) Primary Care Provider: [name] Other Clinician: Referring Provider: Treating Provider/Extender: [name] in Treatment: [place] Active Problems ICD-10 Encounter Code Description Active Date MDM Diagnosis L97.522 Non-pressure chronic ulcer of other part of left foot with fat layer exposed 09/03/2020 No Yes Inactive Problems ICD-10 Code Description Active Date Inactive Date L97.512 Non-pressure chronic ulcer of other part of right foot with fat layer exposed 09/03/2020 09/03/2020 L89.893 Pressure ulcer of other site, stage 3 07/15/2020 07/15/2020 M62.81 Muscle weakness (generalized) 07/15/2020 07/15/2020 I10 Essential (primary) hypertension 07/15/2020 07/15/2020 M86.171 Other acute osteomyelitis, right ankle and foot 09/03/2020 09/03/2020 Resolved Problems Electronic Signature(s) Signed: 11/10/2022 12:16:40 PM By: Fredirick Maudlin MD FACS Entered By: Fredirick Maudlin on 11/10/2022 12:16:40 [name] ([id number]) [id number] Page 8 of 15 -------------------------------------------------------------------------------- Progress Note Details Patient Name: Date of Service: [name], [name] 11/10/2022 10:45 A M Medical Record Number:  [id number] Patient Account Number: [id number] Date of Birth/Sex: Treating RN: 1975-11-20 (47 y.o. M) Primary Care Provider: [name] Other Clinician: Referring Provider: Treating Provider/Extender: [name] in Treatment: 121 Subjective Chief Complaint Information obtained from Patient Bilateral Plantar Foot Ulcers History of Present Illness (HPI) Wounds are12/03/2020 upon  evaluation today patient presents for initial inspection here in our clinic concerning issues he has been having with the bottoms of his feet bilaterally. He states these actually occurred as wounds when he was hospitalized for 5 months secondary to Covid. He was apparently with tilting bed where he was in an upright position quite frequently and apparently this occurred in some way shape or form during that time. Fortunately there is no sign of active infection at this time. No fevers, chills, nausea, vomiting, or diarrhea. With that being said he still has substantial wounds on the plantar aspects of his feet Theragen require quite a bit of work to get these to heal. He has been using Santyl currently though that is been problematic both in receiving the medication as well as actually paid for it as it is become quite expensive. Prior to the experience with Covid the patient really did not have any major medical problems other than hypertension he does have some mild generalized weakness following the Covid experience. 07/22/2020 on evaluation today patient appears to be doing okay in regard to his foot ulcers I feel like the wound beds are showing signs of better improvement that I do believe the Iodoflex is helping in this regard. With that being said he does have a lot of drainage currently and this is somewhat blue/green in nature which is consistent with Pseudomonas. I do think a culture today would be appropriate for [place] to evaluate and see if that is indeed the case I would likely start him on antibiotic orally as well he is not allergic to Cipro knows of no issues he has had in the past 12/21; patient was admitted to the clinic earlier this month with bilateral presumed pressure ulcers on the bottom of his feet apparently related to excessive pressure from a tilt table arrangement in the intensive care unit. Patient relates this to being on ECMO but I am not really sure that is exactly related  to that. I must say I have never seen anything like this. He has fairly extensive full-thickness wounds extending from his heel towards his midfoot mostly centered laterally. There is already been some healing distally. He does not appear to have an arterial issue. He has been using gentamicin to the wound surfaces with Iodoflex to help with ongoing debridement 1/6; this is a patient with pressure ulcers on the bottom of his feet related to excessive pressure from a standing position in the intensive care unit. He is complaining of a lot of pain in the right heel. He is not a diabetic. 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 We have been using Iodoflex to help prepare the surfaces of both wounds for an advanced treatment product. He is nonambulatory spending most of his time in a wheelchair I have asked him not to propel the wheelchair with his heels 1/13; in general his wounds look better not much surface area change we have been using Iodoflex as of last week. I did an x-ray of the right heel as the patient was complaining of pain. I had some thoughts about a stress fracture perhaps Achilles tendon problems however what it showed was erosive  changes along the inferior aspect of the calcaneus he now has a MRI booked for 1/20. 1/20; in general his wounds continue to be better. Some improvement in the large narrow areas proximally in his foot. He is still complaining of pain in the right heel and tenderness in certain areas of this wound. His MRI is tonight. I am not just looking for osteomyelitis that was brought up on the x-ray I am wondering about stress fractures, tendon ruptures etc. He has no such findings on the left. Also noteworthy is that the patient had critical illness neuropathy and some of the discomfort may be actual improvement in nerve function I am just not sure. These wounds were initially in the setting of severe critical illness related to COVID-19. He was  put in a standing position. He may have also been on pressors at the point contributing to tissue ischemia. By his description at some point these wounds were grossly necrotic extending proximally up into the Achilles part of his heel. I do not know that I have ever really seen pictures of them like this although they may exist in epic We have ordered Tri layer Oasis. I am trying to stimulate some granulation in these areas. This is of course assuming the MRI is negative for infection 1/27; since the patient was last here he saw Dr. Juleen China of infectious disease. He is planned for vancomycin and ceftriaxone. Prior operative culture grew MSSA. Also ordered baseline lab work. He also ordered arterial studies although the ABIs in our clinic were normal as well as his clinical exam these were normal I do not think he needs to see vascular surgery. His ABIs at the PTA were 1.22 in the right triphasic waveforms with a normal TBI of 1.15 on the left ABI of 1.22 with triphasic waveforms and a normal TBI of 1.08. Finally he saw Dr. Amalia Hailey who will follow him in for 2 months. At this point I do not think he felt that he needed a procedure on the right calcaneal bone. Dr. Juleen China is elected for broad-spectrum antibiotic The patient is still having pain in the right heel. He walks with a walker 2/3; wounds are generally smaller. He is tolerating his IV antibiotics. I believe this is vancomycin and ceftriaxone. We are still waiting for Oasis burn in terms of his out-of-pocket max which he should be meeting soon given the IV antibiotics, MRIs etc. I have asked him to check in on this. We are using silver collagen in the meantime the wounds look better 2/10; tolerating IV vancomycin and Rocephin. We are waiting to apply for Oasis. Although I am not really sure where he is in his out-of-pocket max. 2/17 started the first application of Oasis trilayer. Still on antibiotics. The wounds have generally look better. The  area on the left has a little more surface slough requiring debridement [id number]; second application of Oasis trilayer. The wound surface granulation is generally look better. The area on the left with undermining laterally I think is come in a bit. 10/08/2020 upon evaluation today patient is here today for [employer] application #3. Fortunately he seems to be doing extremely well with regard to this and we are seeing a lot of new epithelial growth which is great news. Fortunately there is no signs of active infection at this time. 10/16/2020 upon evaluation today patient appears to be doing well with regard to his foot ulcers. Do believe the Oasis has been of benefit for him. I do not  see any signs of infection right now which is great news and I think that he has a lot of new epithelial growth which is great to see as well. The patient is very pleased to hear all of this. I do think we can proceed with the Oasis trilayer #4 today. 3/18; not as much improvement in these areas on his heels that I was hoping. I did reapply trilateral Oasis today the tissue looks healthier but not as much fill in as I was hoping. 3/25; better looking today I think this is come in a bit the tissue looks healthier. Triple layer Oasis reapplied #6 4/1; somewhat better looking definitely better looking surface not as much change in surface area as I was hoping. He may be spending more time [place] on days then he needs to although he does have heel offloading boots. Triple layer Oasis reapplied [name], [name] ([id number]) [id number] Page 9 of 15 4/7; unfortunately apparently [facility] will not approve any further [place] which is unfortunate since the patient did respond nicely both in terms of the condition of the wound bed as well as surface area. There is still some drainage coming from the wound but not a lot there does not appear to be any infection 4/15; we have been using  Hydrofera Blue. He continues to have nice rims of epithelialization on the right greater than the left. The left the epithelialization is coming from the tip of his heel. There is moderate drainage. In this that concerns me about a total contact cast. There is no evidence of infection 4/29; patient has been using Hydrofera Blue with dressing changes. He has no complaints or issues today. 5/5; using Hydrofera Blue. I actually think that he looks marginally better than the last time I saw this 3 weeks ago. There are rims of epithelialization on the left thumb coming from the medial side on the right. Using Hydrofera Blue 5/12; using Hydrofera Blue. These continue to make improvements in surface area. His drainage was not listed as severe I therefore went ahead and put a cast on the left foot. Right foot we will continue to dress his previous 5/16; back for first total contact cast change. He did not tolerate this particularly well cast injury on the anterior tibia among other issues. Difficulty sleeping. I talked him about this in some detail and afterwards is elected to continue. I told him I would like to have a cast on for 3 weeks to see if this is going to help at all. I think he agreed 5/19; I think the wound is better. There is no tunneling towards his midfoot. The undermining medially also looks better. He has a rim of new skin distally. I think we are making progress here. The area on the left also continues to look somewhat better to me using Hydrofera Blue. He has a list of complaints about the cast but none of them seem serious 5/26; patient presents for 1 week follow-up. He has been using a total contact cast and tolerating this well. Hydrofera Blue is the main dressing used. He denies signs of infection. 6/2 Hydrofera Blue total contact cast on the left. These were large ulcers that formed in intensive care unit where the patient was recovering from [place]. May have had something to do with  being ventilated in an upright positiono Pressors etc. We have been able to get the areas down considerably and a viable surface. There is some epithelialization in both  sides. Note made of drainage 6/9; changed to [facility] last time because of drainage. He arrives with better looking surfaces and dimensions on the left than the right. Paradoxically the right actually probes more towards his midfoot the left is largely close down but both of these look improved. Using a total contact cast on the left 6/16; complex wounds on his bilateral plantar heels which were initially pressure injury from a stay in the ICU with COVID. We have been using silver alginate most recently. His dimensions of come in quite dramatically however not recently. We have been putting the left foot in a total contact cast 6/23; complex wounds on the bilateral plantar heels. I been putting the left in the cast paradoxically the area on the right is the one that is going towards closure at a faster rate. Quite a bit of drainage on the left. The patient went to see Dr. Amalia Hailey who said he was going to standby for skin grafts. I had actually considered sending him for skin grafts however he would be mandatorily off his feet for a period of weeks to months. I am thinking that the area on the right is going to close on its own the area on the left has been more stubborn even though we have him in a total contact cast 6/30; took him out of a total contact cast last week is the right heel seem to be making better progress than the left where I was placing the cast. We are using silver alginate. Both wounds are smaller right greater than left 7/12; both wounds look as though they are making some progress. We are using silver alginate. Heel offloading boots 7/26; very gradual progress especially on the right. Using silver alginate. He is wearing heel offloading boots 8/18; he continues to close these wounds down very gradually. Using  silver alginate. The problem polymen being definitive about this is areas of what appears to be callus around the margins. This is not a 100% of the area but certainly sizable especially on the right 9/1; bilateral plantar feet wounds secondary to prolonged pressure while being ventilated for COVID-19 in an upright position. Essentially pressure ulcers on the bottom of his feet. He is made substantial progress using silver alginate. 9/14; bilateral plantar feet wounds secondary to prolonged pressure. Making progress using silver alginate. 9/29 bilateral plantar feet wounds secondary to prolonged pressure. I changed him to Iodoflex last week. MolecuLight showing reddened blush fluorescence 10/11; patient presents for follow-up. He has no issues or complaints today. He denies signs of infection. He continues to use Iodoflex and antibiotic ointment to the wound beds. 10/27; 2-week follow-up. No evidence of infection. He has callus and thick dry skin around the wound margins we have been using Iodoflex and Bactroban which was in response to a moderate left MolecuLight reddish blush fluorescence. 11/10; 2-week follow-up. Wound margins again have thick callus however the measurements of the actual wound sites are a lot smaller. Everything looks reasonably healthy here. We have been using Iodoflex He was approved for prime matrix but I have elected to delay this given the improvement in the surface area. Hopefully I will not regret that decision as were getting close to the end of the year in terms of insurance payment 12/8; 2-week follow-up. Wounds are generally smaller in size. These were initially substantial wounds extending into the forefoot all the way into the heel on the bilateral plantar feet. They are now both located on the plantar heel  distal aspect both of these have a lot of callus around the wounds I used a #5 curette to remove this on the right and the left also some subcutaneous debris to  try and get the wound edges were using Iodoflex. He has heel offloading shoe 12/22; 2-week follow-up. Not really much improvement. He has thick callus around the outer edges of both wounds. I remove this there is some nonviable subcutaneous tissue as well. We have been using Iodoflex. Her intake nurse and myself spontaneously thought of a total contact cast I went back in May. At that time we really were not seeing much of an improvement with a cast although the wound was in a much different situation I would like to retry this in 2 weeks and I discussed this with the patient 08/12/2021; the patient has had some improvement with the Iodoflex. The the area on the left heel plantar more improved than the right. I had to put him in a total contact cast on the left although I decided to put that off for 2 weeks. I am going to change his primary dressing to silver collagen. I think in both areas he has had some improvement most of the healing seems to be more proximal in the heel. The wounds are in the mid aspect. A lot of thick callus on the right heel however. 1/19; we are using silver collagen on both plantar heel areas. He has had some improvement today. The left did not require any debridement. He still had some eschar on the right that was debrided but both seem to have contracted. I did not put it total contact cast on him today 2/2 we have been using silver collagen. The area on the right plantar heel has areas that appear to be epithelialized interspersed with dry flaking callus and dry skin. I removed this. This really looks better than on the other side. On the left still a large area with raised edges and debris on the surface. 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 He has no issues or complaints today. 2/9; not much change in the left foot wound with 1 week of a  cast we are using silver collagen. Silver collagen on the right side. The right side has been the better wound surface. We will reapply the total contact cast on the left 2/16; not much improvement on either side I been using silver collagen with a total contact cast on the left. I'm changing the Hydrofera Blue still with a total contact cast on the left 2/23; some improvement on both sides. Disappointing that he has thick callus around the area that we are putting in a total contact cast on the left. We've been using Hydrofera Blue on both wound areas. This is a man who at essentially pressure ulcers in addition to ischemia caused by medications to support his blood pressure (pressors) in the ICU. He was being ventilated in the standing position for severe Covid. A Shiley the wounds extended across his entire foot but are now localized to his plantar heels bilaterally. We have made progress however neither areas healed. I continue to think the total contact cast is helped albeit painstakingly slowly. He has never wanted a plastic surgery consult although I don't know that they would be interested in grafting in area in this location. 10/07/2021: Continued improvement bilaterally. There  is still some callus around the left wound, despite the total contact cast. He has some increased pain in his right midfoot around 1 particular area. This has been painful in the past but seems to be a little bit worse. When his cast was removed today, there was an area on the heel of the left foot that looks a bit macerated. He is also complaining of pain in his left thigh and hip which he thinks is secondary to the limb length discrepancy caused by the cast. 10/14/2021: He continues to improve. A little bit less callus around the left wound. He continues to endorse pain in his right midfoot, but this is not as significant as it was last week. The maceration on his left heel is improved. [name], [name] ([id number])  [id number] Page 10 of 15 10/21/2021: Continued improvement to both wounds. The maceration on his left heel is no longer evident. Less callus bilaterally. Epithelialization progressing. 10/28/2021: Significant improvement this week. The right sided wound is nearly closed with just a small open area at the middle. No maceration seen on the left heel. Continued epithelialization on both sides. No concern for infection. 11/04/2021: T oday, the wounds were measured a little bit differently and come out as larger, but I actually think they are about the same to potentially even smaller, particularly on the left. He continues to accumulate some callus on the right. 11/11/2021: T oday, the patient is expressing some concern that the left wound, despite being in the total contact cast, is not progressing at the same rate as the 1 on the right. He is interested in trying a week without the cast to see how the wound does. The wounds are roughly the same size as last week, with the right perhaps being a little bit smaller. He continues to build up callus on both sites. 11/18/2021: Last week, I permitted the patient to go without his total contact cast, just to see if the cast was really making any difference. Today, both wounds have deteriorated to some extent, suggesting that the cast is providing benefit, at least on the left. Both are larger and have accumulated callus, slough, and other debris. 11/26/2021: I debrided both wounds quite aggressively last week in an effort to stimulate the healing cascade. This appears to have been effective as the left sided wound is a full centimeter shorter in length. Although the right was measured slightly larger, on inspection, it looks as though an area of epithelialized tissue was included in the measurements. We have been using PolyMem Ag on the wound surfaces with a total contact cast to the left. 12/02/2021: It appears that the intake personnel  are including epithelialized tissue in his wound measurements; the right wound is almost completely epithelialized; there is just a crater at the proximal midfoot with some open areas. On the left, he has built up some callus, but the overall wound surface looks good. There is some senescent skin around the wound margin. He has been in PolyMem Ag bilaterally with a total contact cast on the left. 12/09/2021: The right wound is nearly closed; there is just a small open area at the mid calcaneus. On the left, the wound is smaller with minimal callus buildup. No significant drainage. 12/16/2021: The right calcaneal wound remains minimally open at the mid calcaneus; the rest has epithelialized. On the left, the wound is also a little bit smaller. There is some senescent tissue on the periphery. He is getting his first application of  a trial skin substitute called Vendaje today. 12/23/2021: The wound on his right calcaneus is nearly closed; there is just a small area at the most distal aspect of the calcaneus that is open. On the left, the area where we applied to the skin substitute has a healthier-looking bed of granulation tissue. The wound dimensions are not significantly different on this side but the wound surface is improved. 12/30/2021: The wound on the right calcaneus has not changed significantly aside from some accumulation of callus. On the left, the open area is smaller and continues to have an improved surface. He continues to accumulate callus around the wound. He is here for his third application of Vendaje. 01/06/2022: The right calcaneal wound is down to just a couple of millimeters. He continues to accumulate periwound callus. He unfortunately got his cast wet earlier in the week and his left foot is macerated, resulting in some superficial skin loss just distal to the open wound. The open wound itself, however, is much smaller and has a healthier appearing surface. He is here for his fourth  application of Vendaje. 01/13/2022: The right calcaneal wound is about the same. Unfortunately, once again, his cast got wet and his foot is again macerated. This is caused the left calcaneal wound to enlarge. He is here for his fifth application of Vendaje. 01/20/2022: The right calcaneal wound is very small. There is some periwound callus accumulation. He purchased a new cast protector last week and this has been effective in avoiding the maceration that has been occurring on the left. The left calcaneal wound is narrower and has a healthy and viable-appearing surface. He is here for his 6 application of Vendaje. 01/27/2022: The right calcaneal wound is down to just a pinhole. There is some periwound slough and callus. On the left, the wound is narrower and shorter by about a centimeter. The surface is robust and viable-appearing. Unfortunately, the rep for the trial skin substitute product did not provide any for [place] to use today. 02/04/2022: The right calcaneal wound remains unchanged. There is more accumulated callus. On the left, although the intake nurse measured it a little bit longer, it looks about the same to me. The surface has a layer of slough, but underneath this, there is good granulation tissue. 02/10/2022: The right calcaneus wound is nearly closed. There is still some callus that builds up around the site. The left side looks about the same in terms of dimensions, but the surface is more robust and vital-appearing. 02/16/2022: The area of the right calcaneus that was nearly closed last week has closed, but there is a small opening at the mid foot where it looks like some moisture got retained and caused some reopening. The left foot wound is narrower and shallower. Both sites have a fair amount of periwound callus and eschar. 02/24/2022: The small midfoot opening on the right calcaneus is a little bit smaller today. The left foot wound is narrower and shallower. He continues to accumulate  periwound callus. No concern for infection. 03/01/2022: The patient came to clinic early because he showered and got his cast wet. Fortunately, there is no significant maceration to his foot but the callus softened and it looks like the wound on his left calcaneus may be a little bit wider. The wound on his right calcaneus is just a narrow slit. Continued accumulation of periwound callus bilaterally. 03/08/2022: The wound on his right calcaneus is very nearly closed, just a small pinpoint opening under a bit of  eschar; the left wound has come in quite a bit, as well. It is narrower and shorter than at our last visit. Still with accumulated callus and eschar bilaterally. 03/17/2022: The right calcaneal wound is healed. The left wound is smaller and the surface itself is very clean, but there is some blue-green staining on the periwound callus, concerning for Pseudomonas aeruginosa. 03/23/2022: The right calcaneal wound remains closed. The left wound continues to contract. No further blue-green staining. Small amount of callus and slough accumulation. 03/28/2022: He came in early today because he had gotten his cast wet. On inspection, the wound itself did not get wet or macerated, just a little bit of the forefoot. The wound itself is basically unchanged. 04/07/2022: The right foot wound remains closed. The left wound is the smallest that I have seen it to date. It is narrower and shorter. It still continues to accumulate slough on the surface. 04/15/2022: There is a band of epithelium now dividing the small left plantar foot wound in 2. There is still some slough on the surface. 04/21/2022: The wound continues to narrow. Just a little bit of slough on the surface. He seems to be responding well to endoform. 04/28/2022: Continued slow contraction of the wound. There is a little slough on the surface and some periwound callus. We have been using endoform and total contact cast. 05/05/2022: The wound appears to  have stalled. There is slough and some periwound eschar/callus. No concern for infection, however. [name], [name] ([id number]) [id number] Page 11 of 15 05/12/2022: Unfortunately, his right foot has reopened. It is located at the most posterior aspect of his surgical incision. The area was noted to have drainage coming from it when his padding was removed today. Underneath some callus and senescent skin, there is an opening. No purulent drainage or malodor. On the left foot, the wound is again unchanged. There is some light blue staining on the callus, but no malodor or purulent drainage. 10/13; right and left heel remanence of extensive plantar foot wounds. These are better than I remember by quite a big margin however he is still left with wounds on the left plantar heel and the right plantar heel. Been using endoform bilaterally. A culture was done that showed apparently Pseudomonas but we are still waiting for the [facility] antibiotic to use gentamicin today. He is still very active by description I am not sure about the offloading of his noncasted right foot 10/20; both wounds right and left heel debrided not much change from last week. [employer] has arrived which is linezolid, gentamicin and ciprofloxacin we will use this with endoform. T contact cast on the left otal 06/02/2022: Both wounds are smaller today. There is still a fair amount of callus buildup around the right foot ulcer. The left is more superficial and nearly flush with the surrounding tissues. Also with slough and eschar buildup. 06/10/2022: The right sided wound appears to be nearly closed, if not completely so, although it is somewhat difficult to tell given the abnormal tissue and scarring in his foot. There is a fair amount of callus and crust accumulation. On the left, the wound looks about the same, again with callus and slough. He has an appointment next Thursday with Dr. Loel Lofty in podiatry; I  am hopeful that there may be some reconstructive options available for Mr. Wendler. 06/16/2022: Both wounds have some eschar and callus accumulation. The right sided wound is extremely narrow and barely open; the left is narrower than last week. There  is a little bit of slough. He has his appointment in podiatry later today. 06/23/2022: The patient met with Dr. Loel Lofty last week and unfortunately, there are no reconstructive options that he believes would be helpful. He did order an MRI to evaluate for osteomyelitis and fortunately, none was seen. The left sided wound is a little bit shorter and narrower today. The right sided wound is about the same. There is callus and eschar accumulation bilaterally. 06/29/2022: Both feet have improved from last week. There is epithelium making a valiant effort to creep across the surface on the left. The right side looks like it got a little dry and the deep crevasse in his midfoot has cracked. Both have eschar and there is some slough on the left. 07/07/2022: Both feet have improved. There is epithelium completely covering the calcaneus on the right with just a small opening in the crevasse in his midfoot. On the left, the open area of tissue is smaller but he continues to build up callus/eschar and slough. 07/15/2022: The opening in the midfoot on the right is about the same size, covered with eschar and a little bit of slough. The open portion of the left wound is narrower and shorter with a bit of slough buildup. He admits to being on his feet more than recommended. 12/14; as far as I can tell everything on the right foot is closed. There is some eschar I removed some of this I cannot identify any open wound here. As usual this will be a very vulnerable area going forward. On the left this looks really quite healthy. I was pleasantly surprised to see how good this looked. Wound is certainly smaller and there appears to be healthy epithelialization. He has  been using Promogran on the right and endoform on the left. He has been offloading the right foot with a heel offloading boot and he has a running shoe on the right foot 08/04/2022: The right foot remains closed. He has a thick cushioned insole in his sneaker. The left sided wound is smaller with just some slough and eschar accumulation. He is wearing the heel off loader on this foot. 08/15/2022: The right foot remains closed. The left wound has narrowed further. There is some slough and eschar accumulation. 08/25/2022: We put him in a peg assist shoe insert and as a result, he has more epithelialization of the ulcer with minimal slough and eschar accumulation. 09/01/2022: The wound is smaller by about half this week. Still with some slough on the surface. The peg assist shoe seems to be doing a remarkable job of adequately offloading the site. 09/08/2022: There is a little bit more epithelium coming in. There is some slough and callus buildup. 09/16/2022: The wound measures about the same size, but the epithelium that has grown in looks more robust and stronger. There is some slough and callus buildup. 09/23/2022: The wound remains about the same size. The skin edges are looking rather senescent. 09/29/2022: The aggressive debridement I performed last week seems to have been effective. The wound is smaller and has significantly less slough accumulation. 10/06/2022: There has been quite a bit of epithelialization since last week. There are still some open areas with slough accumulation. There is some callus buildup around the perimeter. 10/13/2022: Continued improvement. Minimal callus accumulation. 3/14; patient presents for follow-up. He has been using endoform to the wound bed without issues. He is using a surgical shoe with peg assist for offloading. 10/27/2022: The wound dimensions are stable. There is  some senescent skin accumulation around the perimeter. 11/04/2022: Last week I performed a very aggressive  debridement in an effort to stimulate the healing cascade. As has been the case when I have done this before, the wound has responded well. There has been epithelialization and contraction of the wound. There are just a couple of small open areas. 11/10/2022: Unfortunately, his foot got wet secondary to sweating and there has been some breakdown of the tissue, particularly the skin just adjacent to the main ulcer. Patient History Information obtained from Patient. Family History Cancer - Maternal Grandparents, Diabetes - Father,Paternal Grandparents, Heart Disease - Maternal Grandparents, Hypertension - Father,Paternal Grandparents, Lung Disease - Siblings, No family history of Hereditary Spherocytosis, Kidney Disease, Seizures, Stroke, Thyroid Problems, Tuberculosis. Social History Never smoker, Marital Status - Married, Alcohol Use - Never, Drug Use - No History, Caffeine Use - Daily - tea, soda. Medical History Eyes Denies history of Cataracts, Glaucoma, Optic Neuritis [name], [name] ([id number]) [id number] Page 12 of 15 Ear/Nose/Mouth/Throat Denies history of Chronic sinus problems/congestion, Middle ear problems Hematologic/Lymphatic Denies history of Anemia, Hemophilia, Human Immunodeficiency Virus, Lymphedema, Sickle Cell Disease Respiratory Patient has history of Asthma Denies history of Aspiration, Chronic Obstructive Pulmonary Disease (COPD), Pneumothorax, Sleep Apnea, Tuberculosis Cardiovascular Patient has history of Angina - with COVID, Hypertension Denies history of Arrhythmia, Congestive Heart Failure, Coronary Artery Disease, Deep Vein Thrombosis, Hypotension, Myocardial Infarction, Peripheral Arterial Disease, Peripheral Venous Disease, Phlebitis, Vasculitis Gastrointestinal Denies history of Cirrhosis , Colitis, Crohnoos, Hepatitis A, Hepatitis B, Hepatitis C Endocrine Denies history of Type I Diabetes, Type II  Diabetes Genitourinary Denies history of End Stage Renal Disease Immunological Denies history of Lupus Erythematosus, Raynaudoos, Scleroderma Integumentary (Skin) Denies history of History of Burn Musculoskeletal Denies history of Gout, Rheumatoid Arthritis, Osteoarthritis, Osteomyelitis Neurologic Denies history of Dementia, Neuropathy, Quadriplegia, Paraplegia, Seizure Disorder Oncologic Denies history of Received Chemotherapy, Received Radiation Psychiatric Denies history of Anorexia/bulimia, Confinement Anxiety Hospitalization/Surgery History - COVID PNA 07/22/2019- 11/14/2019. - 03/27/2020 wound debridement/ skin graft. Medical A Surgical History Notes nd Constitutional Symptoms (General Health) COVID PNA 07/22/2019-11/14/2019 [facility], foot drop left foot , Genitourinary kidney stone Psychiatric anxiety Objective Constitutional Hypertensive, asymptomatic. no acute distress. Vitals Time Taken: 10:40 AM, Height: 69 in, Weight: 280 lbs, BMI: 41.3, Temperature: 98.6 F, Pulse: 80 bpm, Respiratory Rate: 18 breaths/min, Blood Pressure: 167/82 mmHg. Respiratory Normal work of breathing on room air. General Notes: 11/10/2022: Unfortunately, his foot got wet secondary to sweating and there has been some breakdown of the tissue, particularly the skin just adjacent to the main ulcer. Integumentary (Hair, Skin) Wound #2 status is Open. Original cause of wound was Pressure Injury. The date acquired was: 10/07/2019. The wound has been in treatment 121 weeks. The wound is located on the Left Calcaneus. The wound measures 4.2cm length x 1.5cm width x 0.1cm depth; 4.948cm^2 area and 0.495cm^3 volume. There is Fat Layer (Subcutaneous Tissue) exposed. There is no tunneling or undermining noted. There is a medium amount of serosanguineous drainage noted. The wound margin is thickened. There is medium (34-66%) red, pink granulation within the wound bed. There is a medium (34-66%) amount of  necrotic tissue within the wound bed including Eschar and Adherent Slough. The periwound skin appearance had no abnormalities noted for color. The periwound skin appearance exhibited: Callus, Scarring, Maceration. The periwound skin appearance did not exhibit: Dry/Scaly. Periwound temperature was noted as No Abnormality. Assessment Active Problems ICD-10 Non-pressure chronic ulcer of other part of left foot with fat layer exposed [name],  [name] ([id number]) [id number] Page 13 of 15 Procedures Wound #2 Pre-procedure diagnosis of Wound #2 is a Pressure Ulcer located on the Left Calcaneus . There was a Excisional Skin/Subcutaneous Tissue Debridement with a total area of 6.3 sq cm performed by Fredirick Maudlin, MD. With the following instrument(s): Curette to remove Non-Viable tissue/material. Material removed includes Subcutaneous Tissue, Slough, and Skin: Epidermis after achieving pain control using Lidocaine 4% T opical Solution. No specimens were taken. A time out was conducted at 10:52, prior to the start of the procedure. A Minimum amount of bleeding was controlled with Pressure. The procedure was tolerated well. Post Debridement Measurements: 4.2cm length x 1.5cm width x 0.1cm depth; 0.495cm^3 volume. Post debridement Stage noted as Category/Stage III. Character of Wound/Ulcer Post Debridement is improved. Post procedure Diagnosis Wound #2: Same as Pre-Procedure General Notes: scribed for Dr. [name] by Adline Peals, RN. Plan Follow-up Appointments: Return Appointment in 1 week. - Dr. [name] Room 3 Anesthetic: Wound #2 Left Calcaneus: (In clinic) Topical Lidocaine 4% applied to wound bed - In clinic Bathing/ Shower/ Hygiene: May shower and wash wound with soap and water. Edema Control - Lymphedema / SCD / Other: Avoid standing for long periods of time. Moisturize legs daily. - as needed Off-Loading: Other: - keep pressure off of the bottom of your  feet. Elevate legs throughout the day. Use the Shoe with the PegAssist off-loading insole Additional Orders / Instructions: Follow Nutritious Diet - Try to get 70-100 grams of Protein a day+ The following medication(s) was prescribed: lidocaine topical 4 % cream cream topical was prescribed at facility WOUND #2: - Calcaneus Wound Laterality: Left Cleanser: Normal Saline (Generic) Every Other Day/30 Days Discharge Instructions: Cleanse the wound with Normal Saline prior to applying a clean dressing using gauze sponges, not tissue or cotton balls. Cleanser: Wound Cleanser (Generic) Every Other Day/30 Days Discharge Instructions: Cleanse the wound with wound cleanser prior to applying a clean dressing using gauze sponges, not tissue or cotton balls. Topical: compounding topical antibiotics from [facility] Every Other Day/30 Days Discharge Instructions: apply directly to wound bed. (KEYSTONE) Prim Dressing: Endoform 2x2 in (Generic) Every Other Day/30 Days ary Discharge Instructions: Moisten with saline or Hydrogel Secondary Dressing: Drawtex 4x4 in Every Other Day/30 Days Discharge Instructions: Apply over primary dressing as directed. Secondary Dressing: Optifoam Non-Adhesive Dressing, 4x4 in (Generic) Every Other Day/30 Days Discharge Instructions: Apply over primary dressing as directed. Secondary Dressing: Woven Gauze Sponge, Non-Sterile 4x4 in (Generic) Every Other Day/30 Days Discharge Instructions: Apply over primary dressing as directed. Secured With: 34M Medipore H Soft Cloth Surgical T ape, 4 x 10 (in/yd) (Generic) Every Other Day/30 Days Discharge Instructions: Secure with tape as directed. Com pression Wrap: Kerlix Roll 4.5x3.1 (in/yd) Every Other Day/30 Days Discharge Instructions: Apply Kerlix and Coban compression as directed. 11/10/2022: Unfortunately, his foot got wet secondary to sweating and there has been some breakdown of the tissue, particularly the skin just adjacent  to the main ulcer. I used a curette to debride slough, macerated skin, and subcutaneous tissue from the wound. We will continue to use his Keystone topical antibiotic compound (prescription drug) and endoform. We discussed ways to minimize moisture accumulation and have ordered him some drawtex to use at home. He also feels like his peg assist shoe is no longer offloading adequately so we will replace this. Follow-up in 1 week. Electronic Signature(s) Signed: 11/10/2022 12:21:05 PM By: Fredirick Maudlin MD FACS Entered By: Fredirick Maudlin on 11/10/2022 12:21:05 -------------------------------------------------------------------------------- HxROS Details Patient Name:  Date of Service: [name], [place] 11/10/2022 10:45 A M Medical Record Number: [id number] Patient Account Number: [id number] Date of Birth/Sex: Treating RN: [birth year] (47 y.o. M) Primary Care Provider: [name] Other Clinician: Referring Provider: Treating Provider/Extender: [name] in Treatment: [address] ([id number]) [id number] Page 14 of 15 Information Obtained From Patient Constitutional Symptoms (General Health) Medical History: Past Medical History Notes: COVID PNA 07/22/2019-11/14/2019 [facility], foot drop left foot , Eyes Medical History: Negative for: Cataracts; Glaucoma; Optic Neuritis Ear/Nose/Mouth/Throat Medical History: Negative for: Chronic sinus problems/congestion; Middle ear problems Hematologic/Lymphatic Medical History: Negative for: Anemia; Hemophilia; Human Immunodeficiency Virus; Lymphedema; Sickle Cell Disease Respiratory Medical History: Positive for: Asthma Negative for: Aspiration; Chronic Obstructive Pulmonary Disease (COPD); Pneumothorax; Sleep Apnea; Tuberculosis Cardiovascular Medical History: Positive for: Angina - with COVID; Hypertension Negative for: Arrhythmia; Congestive Heart Failure; Coronary Artery Disease;  Deep Vein Thrombosis; Hypotension; Myocardial Infarction; Peripheral Arterial Disease; Peripheral Venous Disease; Phlebitis; Vasculitis Gastrointestinal Medical History: Negative for: Cirrhosis ; Colitis; Crohns; Hepatitis A; Hepatitis B; Hepatitis C Endocrine Medical History: Negative for: Type I Diabetes; Type II Diabetes Genitourinary Medical History: Negative for: End Stage Renal Disease Past Medical History Notes: kidney stone Immunological Medical History: Negative for: Lupus Erythematosus; Raynauds; Scleroderma Integumentary (Skin) Medical History: Negative for: History of Burn Musculoskeletal Medical History: Negative for: Gout; Rheumatoid Arthritis; Osteoarthritis; Osteomyelitis Neurologic Medical History: Negative for: Dementia; Neuropathy; Quadriplegia; Paraplegia; Seizure Disorder Oncologic Medical History: Negative for: Received Chemotherapy; Received Radiation [name], [name] ([id number]) [id number] Page 15 of 15 Psychiatric Medical History: Negative for: Anorexia/bulimia; Confinement Anxiety Past Medical History Notes: anxiety Immunizations Pneumococcal Vaccine: Received Pneumococcal Vaccination: No Implantable Devices None Hospitalization / Surgery History Type of Hospitalization/Surgery COVID PNA 07/22/2019- 11/14/2019 03/27/2020 wound debridement/ skin graft Family and Social History Cancer: Yes - Maternal Grandparents; Diabetes: Yes - Father,Paternal Grandparents; Heart Disease: Yes - Maternal Grandparents; Hereditary Spherocytosis: No; Hypertension: Yes - Father,Paternal Grandparents; Kidney Disease: No; Lung Disease: Yes - Siblings; Seizures: No; Stroke: No; Thyroid Problems: No; Tuberculosis: No; Never smoker; Marital Status - Married; Alcohol Use: Never; Drug Use: No History; Caffeine Use: Daily - tea, soda; Financial Concerns: No; Food, Clothing or Shelter Needs: No; Support System Lacking: No; Transportation Concerns:  No Electronic Signature(s) Signed: 11/10/2022 12:44:03 PM By: Fredirick Maudlin MD FACS Entered By: Fredirick Maudlin on 11/10/2022 12:18:13 -------------------------------------------------------------------------------- SuperBill Details Patient Name: Date of Service: [name], [place]. 11/10/2022 Medical Record Number: [id number] Patient Account Number: [id number] Date of Birth/Sex: Treating RN: 25-Mar-1976 (47 y.o. M) Primary Care Provider: [name] Other Clinician: Referring Provider: Treating Provider/Extender: [name] in Treatment: 121 Diagnosis Coding ICD-10 Codes Code Description [phone] Non-pressure chronic ulcer of other part of left foot with fat layer exposed Facility Procedures : CPT4 Code: [id number] Description: [place] TISSUE 20 SQ CM/< ICD-10 Diagnosis Description L97.522 Non-pressure chronic ulcer of other part of left foot with fat layer exposed Modifier: Quantity: 1 Physician Procedures : CPT4 Code Description Modifier [id number] - WC PHYS LEVEL 4 - EST PT 25 ICD-10 Diagnosis Description L97.522 Non-pressure chronic ulcer of other part of left foot with fat layer exposed Quantity: 1 : [id number] - WC PHYS SUBQ TISS 20 SQ CM ICD-10 Diagnosis Description L97.522 Non-pressure chronic ulcer of other part of left foot with fat layer exposed Quantity: 1 Electronic Signature(s) Signed: 11/10/2022 12:21:20 PM By: Fredirick Maudlin MD FACS Entered By: Fredirick Maudlin on 11/10/2022 12:21:20

## 2022-11-15 IMAGING — XA IR PICC >5YO
1 series · 3 of 3 positions shown · IV contrast (agent unspecified)
Comparison: none

INDICATION: Open wound of the right foot requiring IV antibiotics. Request PICC
line placement.

EXAM:
ULTRASOUND AND FLUOROSCOPIC GUIDED PICC LINE INSERTION
MEDICATIONS:
1% plain lidocaine, 1 mL
CONTRAST:  None
FLUOROSCOPY TIME:  Six seconds
COMPLICATIONS:
None immediate.
TECHNIQUE: The procedure, risks, benefits, and alternatives were explained to
the patient and informed written consent was obtained. A timeout was
performed prior to the initiation of the procedure.

[Series 300: ir picc placement left >5 yrs inc img gu · 3 of 3 slices shown]
[im 1/3]
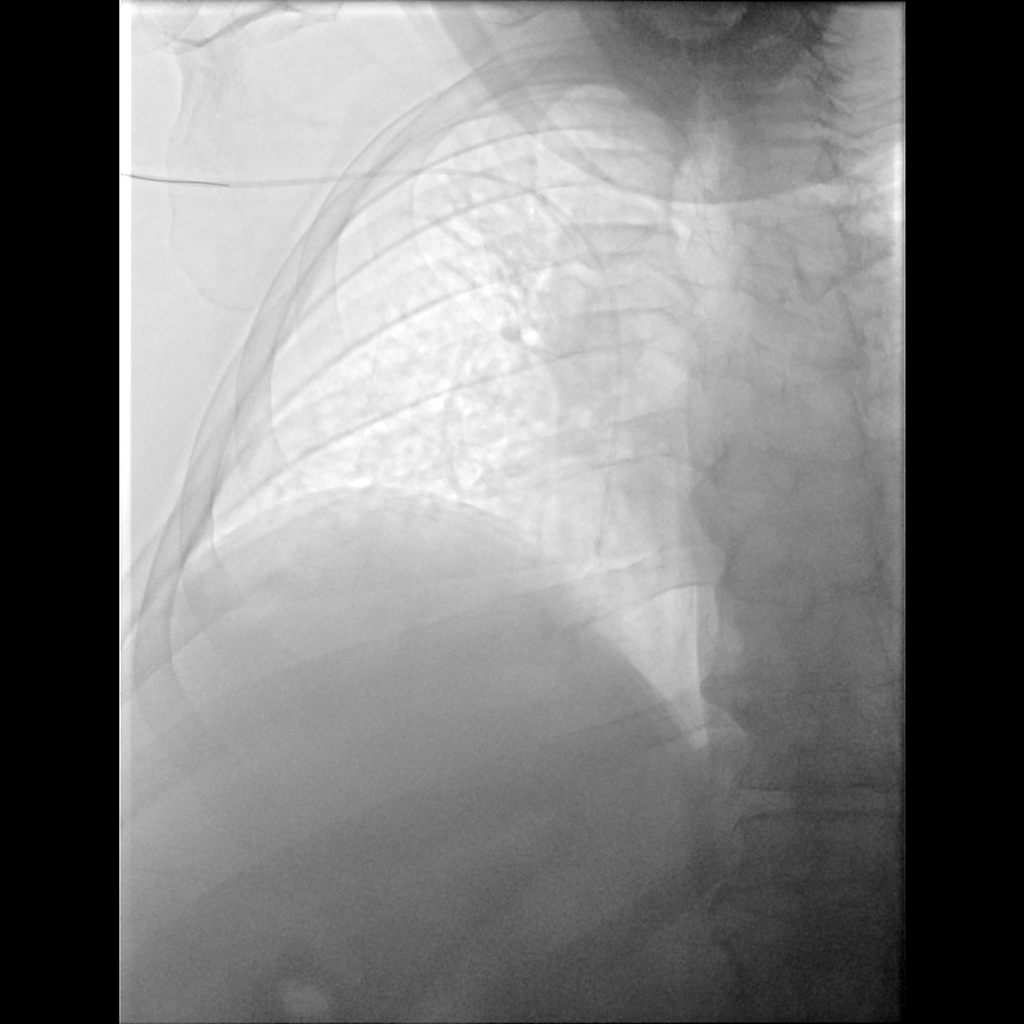
[im 2/3]
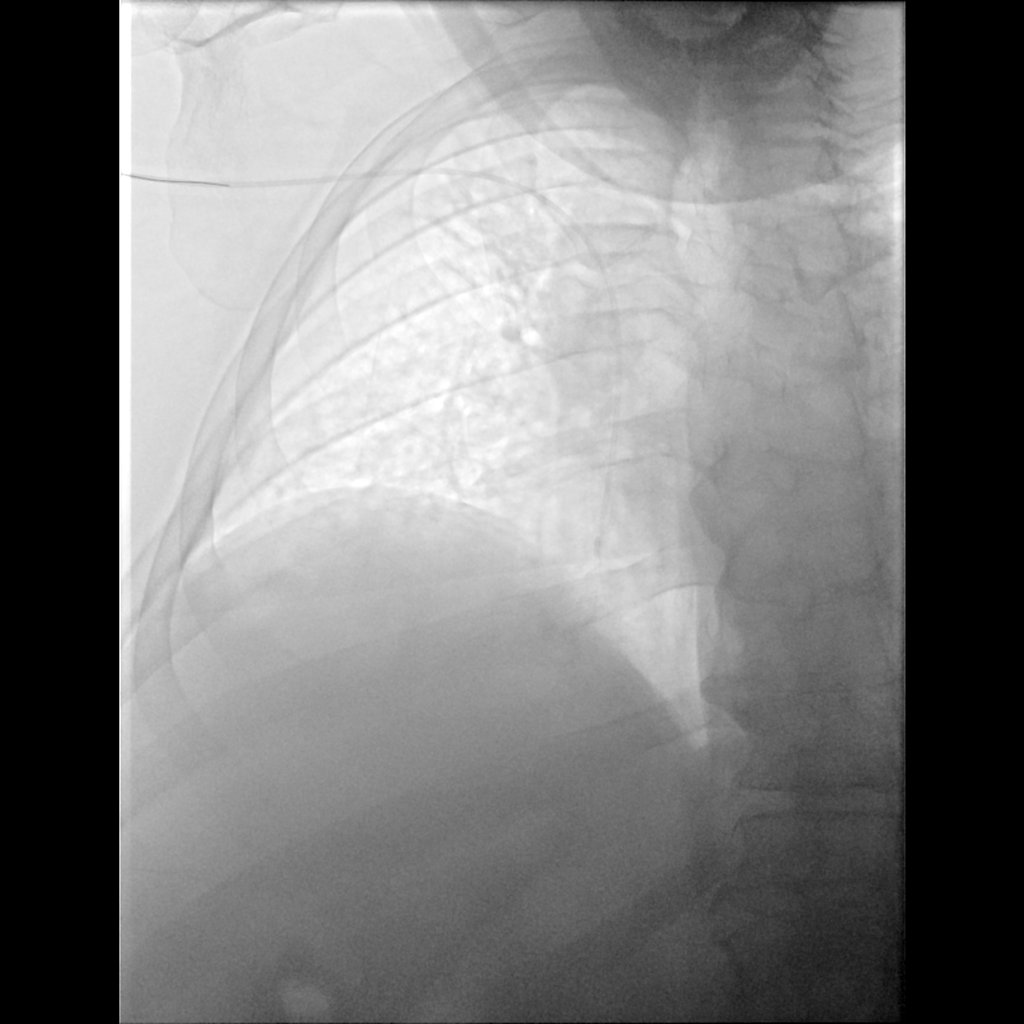
[im 3/3]
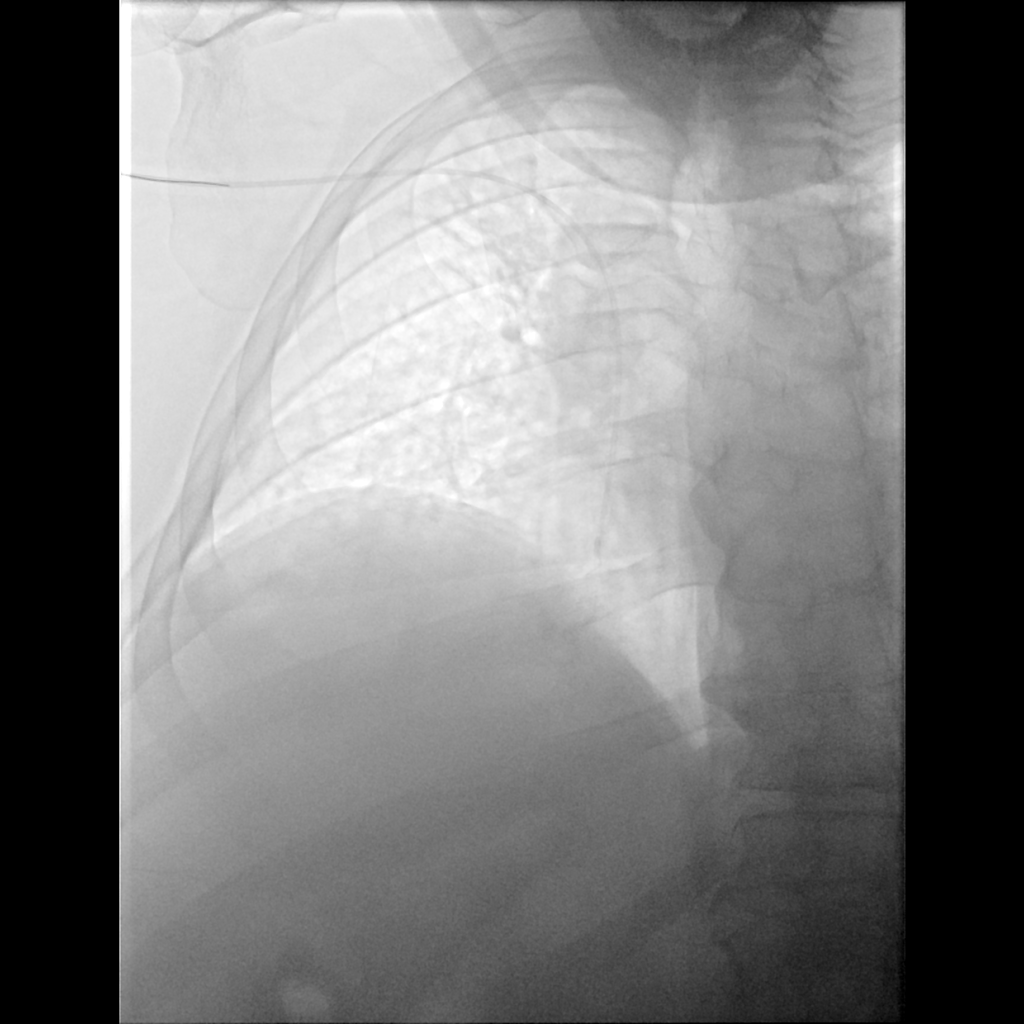

[3 of 3 positions shown; findings below may reference images not displayed]

The right upper extremity was prepped with chlorhexidine in a
sterile fashion, and a sterile drape was applied covering the
operative field. Maximum barrier sterile technique with sterile
gowns and gloves were used for the procedure. A timeout was
performed prior to the initiation of the procedure. Local anesthesia
was provided with 1% lidocaine.

Under direct ultrasound guidance, the basilic vein was accessed with
a micropuncture kit after the overlying soft tissues were
anesthetized with 1% lidocaine.

After the overlying soft tissues were anesthetized, a small venotomy
incision was created and a micropuncture kit was utilized to access
the right basilic vein. Real-time ultrasound guidance was utilized
for vascular access including the acquisition of a permanent
ultrasound image documenting patency of the accessed vessel.

A guidewire was advanced to the level of the superior caval-atrial
junction for measurement purposes and the PICC line was cut to
length. A peel-away sheath was placed and a 39 cm, 5 French, single
lumen was inserted to level of the superior caval-atrial junction. A
post procedure spot fluoroscopic was obtained. The catheter easily
aspirated and flushed and was sutured in place. A dressing was
placed. The patient tolerated the procedure well without immediate
post procedural complication.
FINDINGS: After catheter placement, the tip lies within the superior
cavoatrial junction. The catheter aspirates and flushes normally and
is ready for immediate use.
IMPRESSION: Successful ultrasound and fluoroscopic guided placement of a right
basilic vein approach, 39 cm, 5 French, single lumen PICC with tip
at the superior caval-atrial junction. The PICC line is ready for
immediate use.

## 2022-11-18 ENCOUNTER — Encounter (HOSPITAL_BASED_OUTPATIENT_CLINIC_OR_DEPARTMENT_OTHER): Payer: PPO | Admitting: General Surgery

## 2022-11-18 DIAGNOSIS — L97522 Non-pressure chronic ulcer of other part of left foot with fat layer exposed: Secondary | ICD-10-CM | POA: Diagnosis not present

## 2022-11-18 DIAGNOSIS — L89623 Pressure ulcer of left heel, stage 3: Secondary | ICD-10-CM | POA: Diagnosis not present

## 2022-11-18 NOTE — Progress Notes (Signed)
[name], [name] ([id number]) [phone].pdf Page 1 of 16 Visit Report for 11/18/2022 Chief Complaint Document Details Patient Name: Date of Service: [name], [name] [name] 11/18/2022 9:30 A M Medical Record Number: [id number] Patient Account Number: [id number] Date of Birth/Sex: Treating RN: 1976/06/01 (47 y.o. M) Primary Care Provider: Dorinda Hill Other Clinician: Referring Provider: Treating Provider/Extender: Jana Hakim in Treatment: 122 Information Obtained from: Patient Chief Complaint Bilateral Plantar Foot Ulcers Electronic Signature(s) Signed: 11/18/2022 10:40:35 AM By: [name] MD FACS Entered By: [name] on 11/18/2022 10:40:35 -------------------------------------------------------------------------------- Debridement Details Patient Name: Date of Service: [name], [name] [place] E. 11/18/2022 9:30 A M Medical Record Number: [id number] Patient Account Number: [id number] Date of Birth/Sex: Treating RN: 18-Jun-1976 (47 y.o. [name] Primary Care Provider: Dorinda Hill Other Clinician: Referring Provider: Treating Provider/Extender: Jana Hakim in Treatment: 122 Debridement Performed for Assessment: Wound #2 Left Calcaneus Performed By: Physician [name], MD Debridement Type: Debridement Level of Consciousness (Pre-procedure): Awake and Alert Pre-procedure Verification/Time Out Yes - 10:17 Taken: Start Time: 10:17 Pain Control: Lidocaine 5% topical ointment T Area Debrided (L x W): otal 4 (cm) x 1.3 (cm) = 5.2 (cm) Tissue and other material debrided: Non-Viable, Slough, Slough Level: Non-Viable Tissue Debridement Description: Selective/Open Wound Instrument: Curette Bleeding: Minimum Hemostasis Achieved: Pressure End Time: 10:19 Procedural Pain: 0 Post Procedural Pain: 0 Response [name] Treatment: Procedure was tolerated well Level of Consciousness (Post- Awake and  Alert procedure): Post Debridement Measurements of Total Wound Length: (cm) 4 Stage: Category/Stage III Width: (cm) 1.3 Depth: (cm) 0.1 Volume: (cm) 0.408 Character of Wound/Ulcer Post Debridement: Improved Post Procedure Diagnosis Same as Pre-procedure Notes Scribed for Dr. Lady Gary by J.Scotton Electronic Signature(s) Signed: 11/18/2022 11:32:38 AM By: [name] MD FACS Signed: 11/18/2022 3:43:45 PM By: [name] RN [name] ([id number]) [phone].pdf Page 2 of 16 Entered By: [name] on 11/18/2022 10:23:02 -------------------------------------------------------------------------------- HPI Details Patient Name: Date of Service: [name], [name] [name] 11/18/2022 9:30 A M Medical Record Number: [id number] Patient Account Number: [id number] Date of Birth/Sex: Treating RN: 10-06-1975 (47 y.o. M) Primary Care Provider: Dorinda Hill Other Clinician: Referring Provider: Treating Provider/Extender: Jana Hakim in Treatment: 122 History of Present Illness HPI Description: Wounds are12/03/2020 upon evaluation today patient presents for initial inspection here in our clinic concerning issues he has been having with the bottoms of his feet bilaterally. He states these actually occurred as wounds when he was hospitalized for 5 months secondary [name] Covid. He was apparently with tilting bed where he was in an upright position quite frequently and apparently this occurred in some way shape or form during that time. Fortunately there is no sign of active infection at this time. No fevers, chills, nausea, vomiting, or diarrhea. With that being said he still has substantial wounds on the plantar aspects of his feet Theragen require quite a bit of work [name] get these [name] heal. He has been using Santyl currently though that is been problematic both in receiving the medication as well as actually paid for it as it is become quite expensive.  Prior [name] the experience with Covid the patient really did not have any major medical problems other than hypertension he does have some mild generalized weakness following the Covid experience. 07/22/2020 on evaluation today patient appears [name] be doing okay in regard [name] his foot ulcers I feel like the wound beds are showing signs of better improvement that I do believe the Iodoflex is  helping in this regard. With that being said he does have a lot of drainage currently and this is somewhat blue/green in nature which is consistent with Pseudomonas. I do think a culture today would be appropriate for [place] [name] evaluate and see if that is indeed the case I would likely start him on antibiotic orally as well he is not allergic [name] Cipro knows of no issues he has had in the past 12/21; patient was admitted [name] the clinic earlier this month with bilateral presumed pressure ulcers on the bottom of his feet apparently related [name] excessive pressure from a tilt table arrangement in the intensive care unit. Patient relates this [name] being on ECMO but I am not really sure that is exactly related [name] that. I must say I have never seen anything like this. He has fairly extensive full-thickness wounds extending from his heel towards his midfoot mostly centered laterally. There is already been some healing distally. He does not appear [name] have an arterial issue. He has been using gentamicin [name] the wound surfaces with Iodoflex [name] help with ongoing debridement 1/6; this is a patient with pressure ulcers on the bottom of his feet related [name] excessive pressure from a standing position in the intensive care unit. He is complaining of a lot of pain in the right heel. He is not a diabetic. 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 We have been using Iodoflex [name] help prepare the surfaces of both wounds for an advanced treatment product. He is nonambulatory spending most of his time in a wheelchair I have asked  him not [name] propel the wheelchair with his heels 1/13; in general his wounds look better not much surface area change we have been using Iodoflex as of last week. I did an x-ray of the right heel as the patient was complaining of pain. I had some thoughts about a stress fracture perhaps Achilles tendon problems however what it showed was erosive changes along the inferior aspect of the calcaneus he now has a MRI booked for 1/20. 1/20; in general his wounds continue [name] be better. Some improvement in the large narrow areas proximally in his foot. He is still complaining of pain in the right heel and tenderness in certain areas of this wound. His MRI is tonight. I am not just looking for osteomyelitis that was brought up on the x-ray I am wondering about stress fractures, tendon ruptures etc. He has no such findings on the left. Also noteworthy is that the patient had critical illness neuropathy and some of the discomfort may be actual improvement in nerve function I am just not sure. These wounds were initially in the setting of severe critical illness related [name] COVID-19. He was put in a standing position. He may have also been on pressors at the point contributing [name] tissue ischemia. By his description at some point these wounds were grossly necrotic extending proximally up into the Achilles part of his heel. I do not know that I have ever really seen pictures of them like this although they may exist in epic We have ordered Tri layer Oasis. I am trying [name] stimulate some granulation in these areas. This is of course assuming the MRI is negative for infection 1/27; since the patient was last here he saw Dr. Earlene Plater of infectious disease. He is planned for vancomycin and ceftriaxone. Prior operative culture grew MSSA. Also ordered baseline lab work. He also ordered arterial studies although the ABIs in our clinic  were normal as well as his clinical exam these were normal I do not think he needs [name]  see vascular surgery. His ABIs at the PTA were 1.22 in the right triphasic waveforms with a normal TBI of 1.15 on the left ABI of 1.22 with triphasic waveforms and a normal TBI of 1.08. Finally he saw Dr. Logan Bores who will follow him in for 2 months. At this point I do not think he felt that he needed a procedure on the right calcaneal bone. Dr. Earlene Plater is elected for broad-spectrum antibiotic The patient is still having pain in the right heel. He walks with a walker 2/3; wounds are generally smaller. He is tolerating his IV antibiotics. I believe this is vancomycin and ceftriaxone. We are still waiting for Oasis burn in terms of his out-of-pocket max which he should be meeting soon given the IV antibiotics, MRIs etc. I have asked him [name] check in on this. We are using silver collagen in the meantime the wounds look better 2/10; tolerating IV vancomycin and Rocephin. We are waiting [name] apply for Oasis. Although I am not really sure where he is in his out-of-pocket max. 2/17 started the first application of Oasis trilayer. Still on antibiotics. The wounds have generally look better. The area on the left has a little more surface slough requiring debridement 2/24; second application of Oasis trilayer. The wound surface granulation is generally look better. The area on the left with undermining laterally I think is come in a bit. 10/08/2020 upon evaluation today patient is here today for [employer] application #3. Fortunately he seems [name] be doing extremely well with regard [name] this and we are seeing a lot of new epithelial growth which is great news. Fortunately there is no signs of active infection at this time. 10/16/2020 upon evaluation today patient appears [name] be doing well with regard [name] his foot ulcers. Do believe the Oasis has been of benefit for him. I do not see any signs of infection right now which is great news and I think that he has a lot of new epithelial growth which is great [name] see as well.  The patient is very pleased [name] hear all of this. I do think we can proceed with the Oasis trilayer #4 today. 3/18; not as much improvement in these areas on his heels that I was hoping. I did reapply trilateral Oasis today the tissue looks healthier but not as much fill in as I was hoping. 3/25; better looking today I think this is come in a bit the tissue looks healthier. Triple layer Oasis reapplied #6 4/1; somewhat better looking definitely better looking surface not as much change in surface area as I was hoping. He may be spending more time [place] on days then he needs [name] although he does have heel offloading boots. Triple layer Oasis reapplied #7 4/7; unfortunately apparently [facility] will not approve any further Oasis which is unfortunate since the patient did respond nicely both in terms of the condition of the wound bed as well as surface area. There is still some drainage coming from the wound but not a lot there does not appear [name] be any KENYATTA, KEIDEL ([id number]) [phone].pdf Page 3 of 16 infection 4/15; we have been using Hydrofera Blue. He continues [name] have nice rims of epithelialization on the right greater than the left. The left the epithelialization is coming from the tip of his heel. There is moderate drainage. In this that concerns me  about a total contact cast. There is no evidence of infection 4/29; patient has been using Hydrofera Blue with dressing changes. He has no complaints or issues today. 5/5; using Hydrofera Blue. I actually think that he looks marginally better than the last time I saw this 3 weeks ago. There are rims of epithelialization on the left thumb coming from the medial side on the right. Using Hydrofera Blue 5/12; using Hydrofera Blue. These continue [name] make improvements in surface area. His drainage was not listed as severe I therefore went ahead and put a cast on the left foot. Right foot we will continue [name] dress  his previous 5/16; back for first total contact cast change. He did not tolerate this particularly well cast injury on the anterior tibia among other issues. Difficulty sleeping. I talked him about this in some detail and afterwards is elected [name] continue. I told him I would like [name] have a cast on for 3 weeks [name] see if this is going [name] help at all. I think he agreed 5/19; I think the wound is better. There is no tunneling towards his midfoot. The undermining medially also looks better. He has a rim of new skin distally. I think we are making progress here. The area on the left also continues [name] look somewhat better [name] me using Hydrofera Blue. He has a list of complaints about the cast but none of them seem serious 5/26; patient presents for 1 week follow-up. He has been using a total contact cast and tolerating this well. Hydrofera Blue is the main dressing used. He denies signs of infection. 6/2 Hydrofera Blue total contact cast on the left. These were large ulcers that formed in intensive care unit where the patient was recovering from COVID. May have had something to do with being ventilated in an upright positiono Pressors etc. We have been able [name] get the areas down considerably and a viable surface. There is some epithelialization in both sides. Note made of drainage 6/9; changed [name] last time because of drainage. He arrives with better looking surfaces and dimensions on the left than the right. Paradoxically the right actually probes more towards his midfoot the left is largely close down but both of these look improved. Using a total contact cast on the left 6/16; complex wounds on his bilateral plantar heels which were initially pressure injury from a stay in the ICU with COVID. We have been using silver alginate most recently. His dimensions of come in quite dramatically however not recently. We have been putting the left foot in a total contact cast 6/23; complex wounds on  the bilateral plantar heels. I been putting the left in the cast paradoxically the area on the right is the one that is going towards closure at a faster rate. Quite a bit of drainage on the left. The patient went [name] see Dr. Logan Bores who said he was going [name] standby for skin grafts. I had actually considered sending him for skin grafts however he would be mandatorily off his feet for a period of weeks [name] months. I am thinking that the area on the right is going [name] close on its own the area on the left has been more stubborn even though we have him in a total contact cast 6/30; took him out of a total contact cast last week is the right heel seem [name] be making better progress than the left where I was placing the cast. We are using silver alginate.  Both wounds are smaller right greater than left 7/12; both wounds look as though they are making some progress. We are using silver alginate. Heel offloading boots 7/26; very gradual progress especially on the right. Using silver alginate. He is wearing heel offloading boots 8/18; he continues [name] close these wounds down very gradually. Using silver alginate. The problem polymen being definitive about this is areas of what appears [name] be callus around the margins. This is not a 100% of the area but certainly sizable especially on the right 9/1; bilateral plantar feet wounds secondary [name] prolonged pressure while being ventilated for COVID-19 in an upright position. Essentially pressure ulcers on the bottom of his feet. He is made substantial progress using silver alginate. 9/14; bilateral plantar feet wounds secondary [name] prolonged pressure. Making progress using silver alginate. 9/29 bilateral plantar feet wounds secondary [name] prolonged pressure. I changed him [name] Iodoflex last week. MolecuLight showing reddened blush fluorescence 10/11; patient presents for follow-up. He has no issues or complaints today. He denies signs of infection. He continues [name] use Iodoflex  and antibiotic ointment [name] the wound beds. 10/27; 2-week follow-up. No evidence of infection. He has callus and thick dry skin around the wound margins we have been using Iodoflex and Bactroban which was in response [name] a moderate left MolecuLight reddish blush fluorescence. 11/10; 2-week follow-up. Wound margins again have thick callus however the measurements of the actual wound sites are a lot smaller. Everything looks reasonably healthy here. We have been using Iodoflex He was approved for prime matrix but I have elected [name] delay this given the improvement in the surface area. Hopefully I will not regret that decision as were getting close [name] the end of the year in terms of insurance payment 12/8; 2-week follow-up. Wounds are generally smaller in size. These were initially substantial wounds extending into the forefoot all the way into the heel on the bilateral plantar feet. They are now both located on the plantar heel distal aspect both of these have a lot of callus around the wounds I used a #5 curette [name] remove this on the right and the left also some subcutaneous debris [name] try and get the wound edges were using Iodoflex. He has heel offloading shoe 12/22; 2-week follow-up. Not really much improvement. He has thick callus around the outer edges of both wounds. I remove this there is some nonviable subcutaneous tissue as well. We have been using Iodoflex. Her intake nurse and myself spontaneously thought of a total contact cast I went back in May. At that time we really were not seeing much of an improvement with a cast although the wound was in a much different situation I would like [name] retry this in 2 weeks and I discussed this with the patient 08/12/2021; the patient has had some improvement with the Iodoflex. The the area on the left heel plantar more improved than the right. I had [name] put him in a total contact cast on the left although I decided [name] put that off for 2 weeks. I am going [name]  change his primary dressing [name] silver collagen. I think in both areas he has had some improvement most of the healing seems [name] be more proximal in the heel. The wounds are in the mid aspect. A lot of thick callus on the right heel however. 1/19; we are using silver collagen on both plantar heel areas. He has had some improvement today. The left did not require any debridement. He still had some  eschar on the right that was debrided but both seem [name] have contracted. I did not put it total contact cast on him today 2/2 we have been using silver collagen. The area on the right plantar heel has areas that appear [name] be epithelialized interspersed with dry flaking callus and dry skin. I removed this. This really looks better than on the other side. On the left still a large area with raised edges and debris on the surface. 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 He has no issues or complaints today. 2/9; not much change in the left foot wound with 1 week of a cast we are using silver collagen. Silver collagen on the right side. The right side has been the better wound surface. We will reapply the total contact cast on the left 2/16; not much improvement on either side I been using silver collagen with a total contact cast on the left. I'm changing the Hydrofera Blue still with a total contact cast on the left 2/23; some improvement on both sides. Disappointing that he has thick callus around the area that we are putting in a total contact cast on the left. We've been using Hydrofera Blue on both wound areas. This is a man who at essentially pressure ulcers in addition [name] ischemia caused by medications [name] support his blood pressure (pressors) in the ICU. He was being ventilated in the standing position for severe Covid. A Shiley the wounds extended across his entire foot but  are now localized [name] his plantar heels bilaterally. We have made progress however neither areas healed. I continue [name] think the total contact cast is helped albeit painstakingly slowly. He has never wanted a plastic surgery consult although I don't know that they would be interested in grafting in area in this location. 10/07/2021: Continued improvement bilaterally. There is still some callus around the left wound, despite the total contact cast. He has some increased pain in his right midfoot around 1 particular area. This has been painful in the past but seems [name] be a little bit worse. When his cast was removed today, there was an area on the heel of the left foot that looks a bit macerated. He is also complaining of pain in his left thigh and hip which he thinks is secondary [name] the limb length discrepancy caused by the cast. 10/14/2021: He continues [name] improve. A little bit less callus around the left wound. He continues [name] endorse pain in his right midfoot, but this is not as significant as it was last week. The maceration on his left heel is improved. 10/21/2021: Continued improvement [name] both wounds. The maceration on his left heel is no longer evident. Less callus bilaterally. Epithelialization progressing. [name], [name] ([id number]) [phone].pdf Page 4 of 16 10/28/2021: Significant improvement this week. The right sided wound is nearly closed with just a small open area at the middle. No maceration seen on the left heel. Continued epithelialization on both sides. No concern for infection. 11/04/2021: T oday, the wounds were measured a little bit differently and come out as larger, but I actually think they are about the same [name] potentially even smaller, particularly on the left. He continues [name] accumulate some callus on the right. 11/11/2021: T oday, the patient is expressing some concern that the left wound, despite being in the total contact cast, is not  progressing at the  same rate as the 1 on the right. He is interested in trying a week without the cast [name] see how the wound does. The wounds are roughly the same size as last week, with the right perhaps being a little bit smaller. He continues [name] build up callus on both sites. 11/18/2021: Last week, I permitted the patient [name] go without his total contact cast, just [name] see if the cast was really making any difference. Today, both wounds have deteriorated [name] some extent, suggesting that the cast is providing benefit, at least on the left. Both are larger and have accumulated callus, slough, and other debris. 11/26/2021: I debrided both wounds quite aggressively last week in an effort [name] stimulate the healing cascade. This appears [name] have been effective as the left sided wound is a full centimeter shorter in length. Although the right was measured slightly larger, on inspection, it looks as though an area of epithelialized tissue was included in the measurements. We have been using PolyMem Ag on the wound surfaces with a total contact cast [name] the left. 12/02/2021: It appears that the intake personnel are including epithelialized tissue in his wound measurements; the right wound is almost completely epithelialized; there is just a crater at the proximal midfoot with some open areas. On the left, he has built up some callus, but the overall wound surface looks good. There is some senescent skin around the wound margin. He has been in PolyMem Ag bilaterally with a total contact cast on the left. 12/09/2021: The right wound is nearly closed; there is just a small open area at the mid calcaneus. On the left, the wound is smaller with minimal callus buildup. No significant drainage. 12/16/2021: The right calcaneal wound remains minimally open at the mid calcaneus; the rest has epithelialized. On the left, the wound is also a little bit smaller. There is some senescent tissue on the periphery. He is getting his first application of a  trial skin substitute called Vendaje today. 12/23/2021: The wound on his right calcaneus is nearly closed; there is just a small area at the most distal aspect of the calcaneus that is open. On the left, the area where we applied [name] the skin substitute has a healthier-looking bed of granulation tissue. The wound dimensions are not significantly different on this side but the wound surface is improved. 12/30/2021: The wound on the right calcaneus has not changed significantly aside from some accumulation of callus. On the left, the open area is smaller and continues [name] have an improved surface. He continues [name] accumulate callus around the wound. He is here for his third application of Vendaje. 01/06/2022: The right calcaneal wound is down [name] just a couple of millimeters. He continues [name] accumulate periwound callus. He unfortunately got his cast wet earlier in the week and his left foot is macerated, resulting in some superficial skin loss just distal [name] the open wound. The open wound itself, however, is much smaller and has a healthier appearing surface. He is here for his fourth application of Vendaje. 01/13/2022: The right calcaneal wound is about the same. Unfortunately, once again, his cast got wet and his foot is again macerated. This is caused the left calcaneal wound [name] enlarge. He is here for his fifth application of Vendaje. 01/20/2022: The right calcaneal wound is very small. There is some periwound callus accumulation. He purchased a new cast protector last week and this has been effective in avoiding the maceration that has been  occurring on the left. The left calcaneal wound is narrower and has a healthy and viable-appearing surface. He is here for his 6 application of Vendaje. 01/27/2022: The right calcaneal wound is down [name] just a pinhole. There is some periwound slough and callus. On the left, the wound is narrower and shorter by about a centimeter. The surface is robust and viable-appearing.  Unfortunately, the rep for the trial skin substitute product did not provide any for [place] [name] use today. 02/04/2022: The right calcaneal wound remains unchanged. There is more accumulated callus. On the left, although the intake nurse measured it a little bit longer, it looks about the same [name] me. The surface has a layer of slough, but underneath this, there is good granulation tissue. 02/10/2022: The right calcaneus wound is nearly closed. There is still some callus that builds up around the site. The left side looks about the same in terms of dimensions, but the surface is more robust and vital-appearing. 02/16/2022: The area of the right calcaneus that was nearly closed last week has closed, but there is a small opening at the mid foot where it looks like some moisture got retained and caused some reopening. The left foot wound is narrower and shallower. Both sites have a fair amount of periwound callus and eschar. 02/24/2022: The small midfoot opening on the right calcaneus is a little bit smaller today. The left foot wound is narrower and shallower. He continues [name] accumulate periwound callus. No concern for infection. 03/01/2022: The patient came [name] clinic early because he showered and got his cast wet. Fortunately, there is no significant maceration [name] his foot but the callus softened and it looks like the wound on his left calcaneus may be a little bit wider. The wound on his right calcaneus is just a narrow slit. Continued accumulation of periwound callus bilaterally. 03/08/2022: The wound on his right calcaneus is very nearly closed, just a small pinpoint opening under a bit of eschar; the left wound has come in quite a bit, as well. It is narrower and shorter than at our last visit. Still with accumulated callus and eschar bilaterally. 03/17/2022: The right calcaneal wound is healed. The left wound is smaller and the surface itself is very clean, but there is some blue-green staining on the periwound  callus, concerning for Pseudomonas aeruginosa. 03/23/2022: The right calcaneal wound remains closed. The left wound continues [name] contract. No further blue-green staining. Small amount of callus and slough accumulation. 03/28/2022: He came in early today because he had gotten his cast wet. On inspection, the wound itself did not get wet or macerated, just a little bit of the forefoot. The wound itself is basically unchanged. 04/07/2022: The right foot wound remains closed. The left wound is the smallest that I have seen it [name] date. It is narrower and shorter. It still continues [name] accumulate slough on the surface. 04/15/2022: There is a band of epithelium now dividing the small left plantar foot wound in 2. There is still some slough on the surface. 04/21/2022: The wound continues [name] narrow. Just a little bit of slough on the surface. He seems [name] be responding well [name] endoform. 04/28/2022: Continued slow contraction of the wound. There is a little slough on the surface and some periwound callus. We have been using endoform and total contact cast. 05/05/2022: The wound appears [name] have stalled. There is slough and some periwound eschar/callus. No concern for infection, however. 05/12/2022: Unfortunately, his right foot has reopened. It is located  at the most posterior aspect of his surgical incision. The area was noted [name] have drainage [name], [name] ([id number]) [phone].pdf Page 5 of 16 coming from it when his padding was removed today. Underneath some callus and senescent skin, there is an opening. No purulent drainage or malodor. On the left foot, the wound is again unchanged. There is some light blue staining on the callus, but no malodor or purulent drainage. 10/13; right and left heel remanence of extensive plantar foot wounds. These are better than I remember by quite a big margin however he is still left with wounds on the left plantar heel and the right plantar heel. Been  using endoform bilaterally. A culture was done that showed apparently Pseudomonas but we are still waiting for the [facility] antibiotic [name] use gentamicin today. He is still very active by description I am not sure about the offloading of his noncasted right foot 10/20; both wounds right and left heel debrided not much change from last week. [name] Echevaria has arrived which is linezolid, gentamicin and ciprofloxacin we will use this with endoform. T contact cast on the left otal 06/02/2022: Both wounds are smaller today. There is still a fair amount of callus buildup around the right foot ulcer. The left is more superficial and nearly flush with the surrounding tissues. Also with slough and eschar buildup. 06/10/2022: The right sided wound appears [name] be nearly closed, if not completely so, although it is somewhat difficult [name] tell given the abnormal tissue and scarring in his foot. There is a fair amount of callus and crust accumulation. On the left, the wound looks about the same, again with callus and slough. He has an appointment next Thursday with Dr. Annamary Rummage in podiatry; I am hopeful that there may be some reconstructive options available for Mr. Beddow. 06/16/2022: Both wounds have some eschar and callus accumulation. The right sided wound is extremely narrow and barely open; the left is narrower than last week. There is a little bit of slough. He has his appointment in podiatry later today. 06/23/2022: The patient met with Dr. Annamary Rummage last week and unfortunately, there are no reconstructive options that he believes would be helpful. He did order an MRI [name] evaluate for osteomyelitis and fortunately, none was seen. The left sided wound is a little bit shorter and narrower today. The right sided wound is about the same. There is callus and eschar accumulation bilaterally. 06/29/2022: Both feet have improved from last week. There is epithelium making a valiant effort [name] creep across the surface on  the left. The right side looks like it got a little dry and the deep crevasse in his midfoot has cracked. Both have eschar and there is some slough on the left. 07/07/2022: Both feet have improved. There is epithelium completely covering the calcaneus on the right with just a small opening in the crevasse in his midfoot. On the left, the open area of tissue is smaller but he continues [name] build up callus/eschar and slough. 07/15/2022: The opening in the midfoot on the right is about the same size, covered with eschar and a little bit of slough. The open portion of the left wound is narrower and shorter with a bit of slough buildup. He admits [name] being on his feet more than recommended. 12/14; as far as I can tell everything on the right foot is closed. There is some eschar I removed some of this I cannot identify any open wound here. As usual this will be a very  vulnerable area going forward. On the left this looks really quite healthy. I was pleasantly surprised [name] see how good this looked. Wound is certainly smaller and there appears [name] be healthy epithelialization. He has been using Promogran on the right and endoform on the left. He has been offloading the right foot with a heel offloading boot and he has a running shoe on the right foot 08/04/2022: The right foot remains closed. He has a thick cushioned insole in his sneaker. The left sided wound is smaller with just some slough and eschar accumulation. He is wearing the heel off loader on this foot. 08/15/2022: The right foot remains closed. The left wound has narrowed further. There is some slough and eschar accumulation. 08/25/2022: We put him in a peg assist shoe insert and as a result, he has more epithelialization of the ulcer with minimal slough and eschar accumulation. 09/01/2022: The wound is smaller by about half this week. Still with some slough on the surface. The peg assist shoe seems [name] be doing a remarkable job of adequately offloading  the site. 09/08/2022: There is a little bit more epithelium coming in. There is some slough and callus buildup. 09/16/2022: The wound measures about the same size, but the epithelium that has grown in looks more robust and stronger. There is some slough and callus buildup. 09/23/2022: The wound remains about the same size. The skin edges are looking rather senescent. 09/29/2022: The aggressive debridement I performed last week seems [name] have been effective. The wound is smaller and has significantly less slough accumulation. 10/06/2022: There has been quite a bit of epithelialization since last week. There are still some open areas with slough accumulation. There is some callus buildup around the perimeter. 10/13/2022: Continued improvement. Minimal callus accumulation. 3/14; patient presents for follow-up. He has been using endoform [name] the wound bed without issues. He is using a surgical shoe with peg assist for offloading. 10/27/2022: The wound dimensions are stable. There is some senescent skin accumulation around the perimeter. 11/04/2022: Last week I performed a very aggressive debridement in an effort [name] stimulate the healing cascade. As has been the case when I have done this before, the wound has responded well. There has been epithelialization and contraction of the wound. There are just a couple of small open areas. 11/10/2022: Unfortunately, his foot got wet secondary [name] sweating and there has been some breakdown of the tissue, particularly the skin just adjacent [name] the main ulcer. 11/18/2022: We continue [name] struggle with moisture-related tissue breakdown around the perimeter of his wound. This has caused the thin epithelium that had formed on the surface [name] disintegrate. The underlying surface of the wound has good granulation tissue. Electronic Signature(s) Signed: 11/18/2022 10:42:23 AM By: [name] MD FACS Entered By: [name] on 11/18/2022  10:42:23 -------------------------------------------------------------------------------- Physical Exam Details Patient Name: Date of Service: [name], [name] [place] E. 11/18/2022 9:30 A M Medical Record Number: [id number] Patient Account Number: [id number] [name], [name] ([id number]) [id number].pdf Page 6 of 16 Date of Birth/Sex: Treating RN: Jun 22, 1976 (47 y.o. M) Primary Care Provider: Other Clinician: Dorinda Hill Referring Provider: Treating Provider/Extender: Jana Hakim in Treatment: 122 Constitutional . . . . no acute distress. Respiratory Normal work of breathing on room air. Notes 11/18/2022: We continue [name] struggle with moisture-related tissue breakdown around the perimeter of his wound. This has caused the thin epithelium that had formed on the surface [name] disintegrate. The underlying surface of the wound has good granulation tissue.  Electronic Signature(s) Signed: 11/18/2022 10:43:07 AM By: [name] MD FACS Entered By: [name] on 11/18/2022 10:43:07 -------------------------------------------------------------------------------- Physician Orders Details Patient Name: Date of Service: [name], [name] [place] E. 11/18/2022 9:30 A M Medical Record Number: [id number] Patient Account Number: [id number] Date of Birth/Sex: Treating RN: February 09, 1976 (47 y.o. [name] Primary Care Provider: Dorinda Hill Other Clinician: Referring Provider: Treating Provider/Extender: Jana Hakim in Treatment: 122 Verbal / Phone Orders: No Diagnosis Coding ICD-10 Coding Code Description [phone] Non-pressure chronic ulcer of other part of left foot with fat layer exposed Follow-up Appointments ppointment in 1 week. - Dr. Lady Gary Room 3 Return A Anesthetic Wound #2 Left Calcaneus (In clinic) Topical Lidocaine 4% applied [name] wound bed - In clinic Bathing/ Shower/ Hygiene May shower and wash wound with soap and  water. Edema Control - Lymphedema / SCD / Other Bilateral Lower Extremities Avoid standing for long periods of time. Moisturize legs daily. - as needed Off-Loading Other: - keep pressure off of the bottom of your feet. Elevate legs throughout the day. Use the Shoe with the PegAssist off-loading insole Additional Orders / Instructions Follow Nutritious Diet - Try [name] get 70-100 grams of Protein a day+ Wound Treatment Wound #2 - Calcaneus Wound Laterality: Left Cleanser: Normal Saline (Generic) Every Other Day/30 Days Discharge Instructions: Cleanse the wound with Normal Saline prior [name] applying a clean dressing using gauze sponges, not tissue or cotton balls. Cleanser: Wound Cleanser (Generic) Every Other Day/30 Days Discharge Instructions: Cleanse the wound with wound cleanser prior [name] applying a clean dressing using gauze sponges, not tissue or cotton balls. Peri-Wound Care: Zinc Oxide Ointment 30g tube Every Other Day/30 Days Discharge Instructions: Apply Zinc Oxide [name] periwound with each dressing change Topical: Gentamicin Every Other Day/30 Days Discharge Instructions: As directed by physician [name] ([id number]) [phone].pdf Page 7 of 16 Topical: compounding topical antibiotics from [facility] pharmacy Every Other Day/30 Days Discharge Instructions: apply directly [name] wound bed. (KEYSTONE) Prim Dressing: Endoform 2x2 in (Generic) Every Other Day/30 Days ary Discharge Instructions: Moisten with saline or Hydrogel Secondary Dressing: Drawtex 4x4 in Every Other Day/30 Days Discharge Instructions: Apply over primary dressing as directed. Secondary Dressing: Optifoam Non-Adhesive Dressing, 4x4 in (Generic) Every Other Day/30 Days Discharge Instructions: Apply over primary dressing as directed. Secondary Dressing: Woven Gauze Sponge, Non-Sterile 4x4 in (Generic) Every Other Day/30 Days Discharge Instructions: Apply over primary dressing as  directed. Secured With: 39M Medipore H Soft Cloth Surgical T ape, 4 x 10 (in/yd) (Generic) Every Other Day/30 Days Discharge Instructions: Secure with tape as directed. Compression Wrap: Kerlix Roll 4.5x3.1 (in/yd) Every Other Day/30 Days Discharge Instructions: Apply Kerlix and Coban compression as directed. Electronic Signature(s) Signed: 11/18/2022 11:32:38 AM By: [name] MD FACS Signed: 11/18/2022 3:43:45 PM By: [name] RN Entered By: [name] on 11/18/2022 11:07:46 -------------------------------------------------------------------------------- Problem List Details Patient Name: Date of Service: [name], [name] [place] E. 11/18/2022 9:30 A M Medical Record Number: [id number] Patient Account Number: [id number] Date of Birth/Sex: Treating RN: 1975/12/11 (46 y.o. M) Primary Care Provider: Dorinda Hill Other Clinician: Referring Provider: Treating Provider/Extender: Jana Hakim in Treatment: 122 Active Problems ICD-10 Encounter Code Description Active Date MDM Diagnosis L97.522 Non-pressure chronic ulcer of other part of left foot with fat layer exposed 09/03/2020 No Yes Inactive Problems ICD-10 Code Description Active Date Inactive Date L97.512 Non-pressure chronic ulcer of other part of right foot with fat layer exposed 09/03/2020 09/03/2020 L89.893 Pressure ulcer of other site, stage  3 07/15/2020 07/15/2020 M62.81 Muscle weakness (generalized) 07/15/2020 07/15/2020 I10 Essential (primary) hypertension 07/15/2020 07/15/2020 M86.171 Other acute osteomyelitis, right ankle and foot 09/03/2020 09/03/2020 Resolved Problems ANVITH, [name] ([id number]) [phone].pdf Page 8 of 16 Electronic Signature(s) Signed: 11/18/2022 10:40:19 AM By: [name] MD FACS Entered By: [name] on 11/18/2022 10:40:19 -------------------------------------------------------------------------------- Progress Note Details Patient Name: Date  of Service: [name], [name] [place] E. 11/18/2022 9:30 A M Medical Record Number: [id number] Patient Account Number: [id number] Date of Birth/Sex: Treating RN: 05-Sep-1975 (47 y.o. M) Primary Care Provider: Dorinda Hill Other Clinician: Referring Provider: Treating Provider/Extender: Jana Hakim in Treatment: 122 Subjective Chief Complaint Information obtained from Patient Bilateral Plantar Foot Ulcers History of Present Illness (HPI) Wounds are12/03/2020 upon evaluation today patient presents for initial inspection here in our clinic concerning issues he has been having with the bottoms of his feet bilaterally. He states these actually occurred as wounds when he was hospitalized for 5 months secondary [name] Covid. He was apparently with tilting bed where he was in an upright position quite frequently and apparently this occurred in some way shape or form during that time. Fortunately there is no sign of active infection at this time. No fevers, chills, nausea, vomiting, or diarrhea. With that being said he still has substantial wounds on the plantar aspects of his feet Theragen require quite a bit of work [name] get these [name] heal. He has been using Santyl currently though that is been problematic both in receiving the medication as well as actually paid for it as it is become quite expensive. Prior [name] the experience with Covid the patient really did not have any major medical problems other than hypertension he does have some mild generalized weakness following the Covid experience. 07/22/2020 on evaluation today patient appears [name] be doing okay in regard [name] his foot ulcers I feel like the wound beds are showing signs of better improvement that I do believe the Iodoflex is helping in this regard. With that being said he does have a lot of drainage currently and this is somewhat blue/green in nature which is consistent with Pseudomonas. I do think a culture today would be appropriate for [place]  [name] evaluate and see if that is indeed the case I would likely start him on antibiotic orally as well he is not allergic [name] Cipro knows of no issues he has had in the past 12/21; patient was admitted [name] the clinic earlier this month with bilateral presumed pressure ulcers on the bottom of his feet apparently related [name] excessive pressure from a tilt table arrangement in the intensive care unit. Patient relates this [name] being on ECMO but I am not really sure that is exactly related [name] that. I must say I have never seen anything like this. He has fairly extensive full-thickness wounds extending from his heel towards his midfoot mostly centered laterally. There is already been some healing distally. He does not appear [name] have an arterial issue. He has been using gentamicin [name] the wound surfaces with Iodoflex [name] help with ongoing debridement 1/6; this is a patient with pressure ulcers on the bottom of his feet related [name] excessive pressure from a standing position in the intensive care unit. He is complaining of a lot of pain in the right heel. He is not a diabetic. 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 We have been using Iodoflex [name] help prepare the surfaces of both wounds for an advanced treatment product. He is  nonambulatory spending most of his time in a wheelchair I have asked him not [name] propel the wheelchair with his heels 1/13; in general his wounds look better not much surface area change we have been using Iodoflex as of last week. I did an x-ray of the right heel as the patient was complaining of pain. I had some thoughts about a stress fracture perhaps Achilles tendon problems however what it showed was erosive changes along the inferior aspect of the calcaneus he now has a MRI booked for 1/20. 1/20; in general his wounds continue [name] be better. Some improvement in the large narrow areas proximally in his foot. He is still complaining of pain in the right heel and  tenderness in certain areas of this wound. His MRI is tonight. I am not just looking for osteomyelitis that was brought up on the x-ray I am wondering about stress fractures, tendon ruptures etc. He has no such findings on the left. Also noteworthy is that the patient had critical illness neuropathy and some of the discomfort may be actual improvement in nerve function I am just not sure. These wounds were initially in the setting of severe critical illness related [name] COVID-19. He was put in a standing position. He may have also been on pressors at the point contributing [name] tissue ischemia. By his description at some point these wounds were grossly necrotic extending proximally up into the Achilles part of his heel. I do not know that I have ever really seen pictures of them like this although they may exist in epic We have ordered Tri layer Oasis. I am trying [name] stimulate some granulation in these areas. This is of course assuming the MRI is negative for infection 1/27; since the patient was last here he saw Dr. Earlene Plater of infectious disease. He is planned for vancomycin and ceftriaxone. Prior operative culture grew MSSA. Also ordered baseline lab work. He also ordered arterial studies although the ABIs in our clinic were normal as well as his clinical exam these were normal I do not think he needs [name] see vascular surgery. His ABIs at the P[facility] were 1.22 in the right triphasic waveforms with a normal TBI of 1.15 on the left ABI of 1.22 with triphasic waveforms and a normal TBI of 1.08. Finally he saw Dr. Logan Bores who will follow him in for 2 months. At this point I do not think he felt that he needed a procedure on the right calcaneal bone. Dr. Earlene Plater is elected for broad-spectrum antibiotic The patient is still having pain in the right heel. He walks with a walker 2/3; wounds are generally smaller. He is tolerating his IV antibiotics. I believe this is vancomycin and ceftriaxone. We are still waiting  for Oasis burn in terms of his out-of-pocket max which he should be meeting soon given the IV antibiotics, MRIs etc. I have asked him [name] check in on this. We are using silver collagen in the meantime the wounds look better 2/10; tolerating IV vancomycin and Rocephin. We are waiting [name] apply for Oasis. Although I am not really sure where he is in his out-of-pocket max. 2/17 started the first application of Oasis trilayer. Still on antibiotics. The wounds have generally look better. The area on the left has a little more surface slough requiring debridement 2/24; second application of Oasis trilayer. The wound surface granulation is generally look better. The area on the left with undermining laterally I think is come in a bit. 10/08/2020 upon evaluation today  patient is here today for [employer] application #3. Fortunately he seems [name] be doing extremely well with regard [name] this and we are seeing a lot of new epithelial growth which is great news. Fortunately there is no signs of active infection at this time. 10/16/2020 upon evaluation today patient appears [name] be doing well with regard [name] his foot ulcers. Do believe the Oasis has been of benefit for him. I do not see any signs of infection right now which is great news and I think that he has a lot of new epithelial growth which is great [name] see as well. The patient is very KEYON, LILLER ([id number]) [phone].pdf Page 9 of 16 pleased [name] hear all of this. I do think we can proceed with the Oasis trilayer #4 today. 3/18; not as much improvement in these areas on his heels that I was hoping. I did reapply trilateral Oasis today the tissue looks healthier but not as much fill in as I was hoping. 3/25; better looking today I think this is come in a bit the tissue looks healthier. Triple layer Oasis reapplied #6 4/1; somewhat better looking definitely better looking surface not as much change in surface area as I was hoping. He  may be spending more time [place] on days then he needs [name] although he does have heel offloading boots. Triple layer Oasis reapplied #7 4/7; unfortunately apparently [facility] will not approve any further Oasis which is unfortunate since the patient did respond nicely both in terms of the condition of the wound bed as well as surface area. There is still some drainage coming from the wound but not a lot there does not appear [name] be any infection 4/15; we have been using Hydrofera Blue. He continues [name] have nice rims of epithelialization on the right greater than the left. The left the epithelialization is coming from the tip of his heel. There is moderate drainage. In this that concerns me about a total contact cast. There is no evidence of infection 4/29; patient has been using Hydrofera Blue with dressing changes. He has no complaints or issues today. 5/5; using Hydrofera Blue. I actually think that he looks marginally better than the last time I saw this 3 weeks ago. There are rims of epithelialization on the left thumb coming from the medial side on the right. Using Hydrofera Blue 5/12; using Hydrofera Blue. These continue [name] make improvements in surface area. His drainage was not listed as severe I therefore went ahead and put a cast on the left foot. Right foot we will continue [name] dress his previous 5/16; back for first total contact cast change. He did not tolerate this particularly well cast injury on the anterior tibia among other issues. Difficulty sleeping. I talked him about this in some detail and afterwards is elected [name] continue. I told him I would like [name] have a cast on for 3 weeks [name] see if this is going [name] help at all. I think he agreed 5/19; I think the wound is better. There is no tunneling towards his midfoot. The undermining medially also looks better. He has a rim of new skin distally. I think we are making progress here. The area on the left also continues [name] look  somewhat better [name] me using Hydrofera Blue. He has a list of complaints about the cast but none of them seem serious 5/26; patient presents for 1 week follow-up. He has been using a total contact cast and tolerating  this well. Hydrofera Blue is the main dressing used. He denies signs of infection. 6/2 Hydrofera Blue total contact cast on the left. These were large ulcers that formed in intensive care unit where the patient was recovering from COVID. May have had something to do with being ventilated in an upright positiono Pressors etc. We have been able [name] get the areas down considerably and a viable surface. There is some epithelialization in both sides. Note made of drainage 6/9; changed [name] [facility] time because of drainage. He arrives with better looking surfaces and dimensions on the left than the right. Paradoxically the right actually probes more towards his midfoot the left is largely close down but both of these look improved. Using a total contact cast on the left 6/16; complex wounds on his bilateral plantar heels which were initially pressure injury from a stay in the ICU with COVID. We have been using silver alginate most recently. His dimensions of come in quite dramatically however not recently. We have been putting the left foot in a total contact cast 6/23; complex wounds on the bilateral plantar heels. I been putting the left in the cast paradoxically the area on the right is the one that is going towards closure at a faster rate. Quite a bit of drainage on the left. The patient went [name] see Dr. Logan Bores who said he was going [name] standby for skin grafts. I had actually considered sending him for skin grafts however he would be mandatorily off his feet for a period of weeks [name] months. I am thinking that the area on the right is going [name] close on its own the area on the left has been more stubborn even though we have him in a total contact cast 6/30; took him out of a total  contact cast last week is the right heel seem [name] be making better progress than the left where I was placing the cast. We are using silver alginate. Both wounds are smaller right greater than left 7/12; both wounds look as though they are making some progress. We are using silver alginate. Heel offloading boots 7/26; very gradual progress especially on the right. Using silver alginate. He is wearing heel offloading boots 8/18; he continues [name] close these wounds down very gradually. Using silver alginate. The problem polymen being definitive about this is areas of what appears [name] be callus around the margins. This is not a 100% of the area but certainly sizable especially on the right 9/1; bilateral plantar feet wounds secondary [name] prolonged pressure while being ventilated for COVID-19 in an upright position. Essentially pressure ulcers on the bottom of his feet. He is made substantial progress using silver alginate. 9/14; bilateral plantar feet wounds secondary [name] prolonged pressure. Making progress using silver alginate. 9/29 bilateral plantar feet wounds secondary [name] prolonged pressure. I changed him [name] Iodoflex last week. MolecuLight showing reddened blush fluorescence 10/11; patient presents for follow-up. He has no issues or complaints today. He denies signs of infection. He continues [name] use Iodoflex and antibiotic ointment [name] the wound beds. 10/27; 2-week follow-up. No evidence of infection. He has callus and thick dry skin around the wound margins we have been using Iodoflex and Bactroban which was in response [name] a moderate left MolecuLight reddish blush fluorescence. 11/10; 2-week follow-up. Wound margins again have thick callus however the measurements of the actual wound sites are a lot smaller. Everything looks reasonably healthy here. We have been using Iodoflex He was approved for prime  matrix but I have elected [name] delay this given the improvement in the surface area. Hopefully I will  not regret that decision as were getting close [name] the end of the year in terms of insurance payment 12/8; 2-week follow-up. Wounds are generally smaller in size. These were initially substantial wounds extending into the forefoot all the way into the heel on the bilateral plantar feet. They are now both located on the plantar heel distal aspect both of these have a lot of callus around the wounds I used a #5 curette [name] remove this on the right and the left also some subcutaneous debris [name] try and get the wound edges were using Iodoflex. He has heel offloading shoe 12/22; 2-week follow-up. Not really much improvement. He has thick callus around the outer edges of both wounds. I remove this there is some nonviable subcutaneous tissue as well. We have been using Iodoflex. Her intake nurse and myself spontaneously thought of a total contact cast I went back in May. At that time we really were not seeing much of an improvement with a cast although the wound was in a much different situation I would like [name] retry this in 2 weeks and I discussed this with the patient 08/12/2021; the patient has had some improvement with the Iodoflex. The the area on the left heel plantar more improved than the right. I had [name] put him in a total contact cast on the left although I decided [name] put that off for 2 weeks. I am going [name] change his primary dressing [name] silver collagen. I think in both areas he has had some improvement most of the healing seems [name] be more proximal in the heel. The wounds are in the mid aspect. A lot of thick callus on the right heel however. 1/19; we are using silver collagen on both plantar heel areas. He has had some improvement today. The left did not require any debridement. He still had some eschar on the right that was debrided but both seem [name] have contracted. I did not put it total contact cast on him today 2/2 we have been using silver collagen. The area on the right plantar heel has areas that  appear [name] be epithelialized interspersed with dry flaking callus and dry skin. I removed this. This really looks better than on the other side. On the left still a large area with raised edges and debris on the surface. 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 He has no issues or complaints today. 2/9; not much change in the left foot wound with 1 week of a cast we are using silver collagen. Silver collagen on the right side. The right side has been the better wound surface. We will reapply the total contact cast on the left 2/16; not much improvement on either side I been using silver collagen with a total contact cast on the left. I'm changing the Hydrofera Blue still with a total contact cast on the left 2/23; some improvement on both sides. Disappointing that he has thick callus around the area that we are putting in a total contact cast on the left. We've been using Hydrofera Blue on both wound areas. This is a man who at essentially pressure ulcers in addition [name] ischemia caused by medications [name] support his blood pressure (pressors) in the ICU. He was being ventilated  in the standing position for severe Covid. A Shiley the wounds extended across his entire foot but are now localized [name] his plantar heels bilaterally. We have made progress however neither areas healed. I continue [name] think the total contact cast is helped albeit painstakingly slowly. He has never wanted a plastic surgery consult although I don't know that they would be interested in grafting in area in this location. 10/07/2021: Continued improvement bilaterally. There is still some callus around the left wound, despite the total contact cast. He has some increased pain in his [name], [name] ([id number]) [phone].pdf Page 10 of 16 right midfoot around 1 particular area.  This has been painful in the past but seems [name] be a little bit worse. When his cast was removed today, there was an area on the heel of the left foot that looks a bit macerated. He is also complaining of pain in his left thigh and hip which he thinks is secondary [name] the limb length discrepancy caused by the cast. 10/14/2021: He continues [name] improve. A little bit less callus around the left wound. He continues [name] endorse pain in his right midfoot, but this is not as significant as it was last week. The maceration on his left heel is improved. 10/21/2021: Continued improvement [name] both wounds. The maceration on his left heel is no longer evident. Less callus bilaterally. Epithelialization progressing. 10/28/2021: Significant improvement this week. The right sided wound is nearly closed with just a small open area at the middle. No maceration seen on the left heel. Continued epithelialization on both sides. No concern for infection. 11/04/2021: T oday, the wounds were measured a little bit differently and come out as larger, but I actually think they are about the same [name] potentially even smaller, particularly on the left. He continues [name] accumulate some callus on the right. 11/11/2021: T oday, the patient is expressing some concern that the left wound, despite being in the total contact cast, is not progressing at the same rate as the 1 on the right. He is interested in trying a week without the cast [name] see how the wound does. The wounds are roughly the same size as last week, with the right perhaps being a little bit smaller. He continues [name] build up callus on both sites. 11/18/2021: Last week, I permitted the patient [name] go without his total contact cast, just [name] see if the cast was really making any difference. Today, both wounds have deteriorated [name] some extent, suggesting that the cast is providing benefit, at least on the left. Both are larger and have accumulated callus, slough, and other  debris. 11/26/2021: I debrided both wounds quite aggressively last week in an effort [name] stimulate the healing cascade. This appears [name] have been effective as the left sided wound is a full centimeter shorter in length. Although the right was measured slightly larger, on inspection, it looks as though an area of epithelialized tissue was included in the measurements. We have been using PolyMem Ag on the wound surfaces with a total contact cast [name] the left. 12/02/2021: It appears that the intake personnel are including epithelialized tissue in his wound measurements; the right wound is almost completely epithelialized; there is just a crater at the proximal midfoot with some open areas. On the left, he has built up some callus, but the overall wound surface looks good. There is some senescent skin around the wound margin. He has been in PolyMem Ag bilaterally with a total contact cast on the  left. 12/09/2021: The right wound is nearly closed; there is just a small open area at the mid calcaneus. On the left, the wound is smaller with minimal callus buildup. No significant drainage. 12/16/2021: The right calcaneal wound remains minimally open at the mid calcaneus; the rest has epithelialized. On the left, the wound is also a little bit smaller. There is some senescent tissue on the periphery. He is getting his first application of a trial skin substitute called Vendaje today. 12/23/2021: The wound on his right calcaneus is nearly closed; there is just a small area at the most distal aspect of the calcaneus that is open. On the left, the area where we applied [name] the skin substitute has a healthier-looking bed of granulation tissue. The wound dimensions are not significantly different on this side but the wound surface is improved. 12/30/2021: The wound on the right calcaneus has not changed significantly aside from some accumulation of callus. On the left, the open area is smaller and continues [name] have an  improved surface. He continues [name] accumulate callus around the wound. He is here for his third application of Vendaje. 01/06/2022: The right calcaneal wound is down [name] just a couple of millimeters. He continues [name] accumulate periwound callus. He unfortunately got his cast wet earlier in the week and his left foot is macerated, resulting in some superficial skin loss just distal [name] the open wound. The open wound itself, however, is much smaller and has a healthier appearing surface. He is here for his fourth application of Vendaje. 01/13/2022: The right calcaneal wound is about the same. Unfortunately, once again, his cast got wet and his foot is again macerated. This is caused the left calcaneal wound [name] enlarge. He is here for his fifth application of Vendaje. 01/20/2022: The right calcaneal wound is very small. There is some periwound callus accumulation. He purchased a new cast protector last week and this has been effective in avoiding the maceration that has been occurring on the left. The left calcaneal wound is narrower and has a healthy and viable-appearing surface. He is here for his 6 application of Vendaje. 01/27/2022: The right calcaneal wound is down [name] just a pinhole. There is some periwound slough and callus. On the left, the wound is narrower and shorter by about a centimeter. The surface is robust and viable-appearing. Unfortunately, the rep for the trial skin substitute product did not provide any for [place] [name] use today. 02/04/2022: The right calcaneal wound remains unchanged. There is more accumulated callus. On the left, although the intake nurse measured it a little bit longer, it looks about the same [name] me. The surface has a layer of slough, but underneath this, there is good granulation tissue. 02/10/2022: The right calcaneus wound is nearly closed. There is still some callus that builds up around the site. The left side looks about the same in terms of dimensions, but the surface is more  robust and vital-appearing. 02/16/2022: The area of the right calcaneus that was nearly closed last week has closed, but there is a small opening at the mid foot where it looks like some moisture got retained and caused some reopening. The left foot wound is narrower and shallower. Both sites have a fair amount of periwound callus and eschar. 02/24/2022: The small midfoot opening on the right calcaneus is a little bit smaller today. The left foot wound is narrower and shallower. He continues [name] accumulate periwound callus. No concern for infection. 03/01/2022: The patient came [name] clinic  early because he showered and got his cast wet. Fortunately, there is no significant maceration [name] his foot but the callus softened and it looks like the wound on his left calcaneus may be a little bit wider. The wound on his right calcaneus is just a narrow slit. Continued accumulation of periwound callus bilaterally. 03/08/2022: The wound on his right calcaneus is very nearly closed, just a small pinpoint opening under a bit of eschar; the left wound has come in quite a bit, as well. It is narrower and shorter than at our last visit. Still with accumulated callus and eschar bilaterally. 03/17/2022: The right calcaneal wound is healed. The left wound is smaller and the surface itself is very clean, but there is some blue-green staining on the periwound callus, concerning for Pseudomonas aeruginosa. 03/23/2022: The right calcaneal wound remains closed. The left wound continues [name] contract. No further blue-green staining. Small amount of callus and slough accumulation. 03/28/2022: He came in early today because he had gotten his cast wet. On inspection, the wound itself did not get wet or macerated, just a little bit of the forefoot. The wound itself is basically unchanged. 04/07/2022: The right foot wound remains closed. The left wound is the smallest that I have seen it [name] date. It is narrower and shorter. It still continues  [name] accumulate slough on the surface. 04/15/2022: There is a band of epithelium now dividing the small left plantar foot wound in 2. There is still some slough on the surface. [name], [name] ([id number]) [id number].pdf Page 11 of 16 04/21/2022: The wound continues [name] narrow. Just a little bit of slough on the surface. He seems [name] be responding well [name] endoform. 04/28/2022: Continued slow contraction of the wound. There is a little slough on the surface and some periwound callus. We have been using endoform and total contact cast. 05/05/2022: The wound appears [name] have stalled. There is slough and some periwound eschar/callus. No concern for infection, however. 05/12/2022: Unfortunately, his right foot has reopened. It is located at the most posterior aspect of his surgical incision. The area was noted [name] have drainage coming from it when his padding was removed today. Underneath some callus and senescent skin, there is an opening. No purulent drainage or malodor. On the left foot, the wound is again unchanged. There is some light blue staining on the callus, but no malodor or purulent drainage. 10/13; right and left heel remanence of extensive plantar foot wounds. These are better than I remember by quite a big margin however he is still left with wounds on the left plantar heel and the right plantar heel. Been using endoform bilaterally. A culture was done that showed apparently Pseudomonas but we are still waiting for the [facility] antibiotic [name] use gentamicin today. He is still very active by description I am not sure about the offloading of his noncasted right foot 10/20; both wounds right and left heel debrided not much change from last week. [name] Echevaria has arrived which is linezolid, gentamicin and ciprofloxacin we will use this with endoform. T contact cast on the left otal 06/02/2022: Both wounds are smaller today. There is still a fair amount of callus buildup around the  right foot ulcer. The left is more superficial and nearly flush with the surrounding tissues. Also with slough and eschar buildup. 06/10/2022: The right sided wound appears [name] be nearly closed, if not completely so, although it is somewhat difficult [name] tell given the abnormal tissue and scarring in his foot. There  is a fair amount of callus and crust accumulation. On the left, the wound looks about the same, again with callus and slough. He has an appointment next Thursday with Dr. Annamary Rummage in podiatry; I am hopeful that there may be some reconstructive options available for Mr. Engelstad. 06/16/2022: Both wounds have some eschar and callus accumulation. The right sided wound is extremely narrow and barely open; the left is narrower than last week. There is a little bit of slough. He has his appointment in podiatry later today. 06/23/2022: The patient met with Dr. Annamary Rummage last week and unfortunately, there are no reconstructive options that he believes would be helpful. He did order an MRI [name] evaluate for osteomyelitis and fortunately, none was seen. The left sided wound is a little bit shorter and narrower today. The right sided wound is about the same. There is callus and eschar accumulation bilaterally. 06/29/2022: Both feet have improved from last week. There is epithelium making a valiant effort [name] creep across the surface on the left. The right side looks like it got a little dry and the deep crevasse in his midfoot has cracked. Both have eschar and there is some slough on the left. 07/07/2022: Both feet have improved. There is epithelium completely covering the calcaneus on the right with just a small opening in the crevasse in his midfoot. On the left, the open area of tissue is smaller but he continues [name] build up callus/eschar and slough. 07/15/2022: The opening in the midfoot on the right is about the same size, covered with eschar and a little bit of slough. The open portion of the left  wound is narrower and shorter with a bit of slough buildup. He admits [name] being on his feet more than recommended. 12/14; as far as I can tell everything on the right foot is closed. There is some eschar I removed some of this I cannot identify any open wound here. As usual this will be a very vulnerable area going forward. On the left this looks really quite healthy. I was pleasantly surprised [name] see how good this looked. Wound is certainly smaller and there appears [name] be healthy epithelialization. He has been using Promogran on the right and endoform on the left. He has been offloading the right foot with a heel offloading boot and he has a running shoe on the right foot 08/04/2022: The right foot remains closed. He has a thick cushioned insole in his sneaker. The left sided wound is smaller with just some slough and eschar accumulation. He is wearing the heel off loader on this foot. 08/15/2022: The right foot remains closed. The left wound has narrowed further. There is some slough and eschar accumulation. 08/25/2022: We put him in a peg assist shoe insert and as a result, he has more epithelialization of the ulcer with minimal slough and eschar accumulation. 09/01/2022: The wound is smaller by about half this week. Still with some slough on the surface. The peg assist shoe seems [name] be doing a remarkable job of adequately offloading the site. 09/08/2022: There is a little bit more epithelium coming in. There is some slough and callus buildup. 09/16/2022: The wound measures about the same size, but the epithelium that has grown in looks more robust and stronger. There is some slough and callus buildup. 09/23/2022: The wound remains about the same size. The skin edges are looking rather senescent. 09/29/2022: The aggressive debridement I performed last week seems [name] have been effective. The wound is smaller and  has significantly less slough accumulation. 10/06/2022: There has been quite a bit of  epithelialization since last week. There are still some open areas with slough accumulation. There is some callus buildup around the perimeter. 10/13/2022: Continued improvement. Minimal callus accumulation. 3/14; patient presents for follow-up. He has been using endoform [name] the wound bed without issues. He is using a surgical shoe with peg assist for offloading. 10/27/2022: The wound dimensions are stable. There is some senescent skin accumulation around the perimeter. 11/04/2022: Last week I performed a very aggressive debridement in an effort [name] stimulate the healing cascade. As has been the case when I have done this before, the wound has responded well. There has been epithelialization and contraction of the wound. There are just a couple of small open areas. 11/10/2022: Unfortunately, his foot got wet secondary [name] sweating and there has been some breakdown of the tissue, particularly the skin just adjacent [name] the main ulcer. 11/18/2022: We continue [name] struggle with moisture-related tissue breakdown around the perimeter of his wound. This has caused the thin epithelium that had formed on the surface [name] disintegrate. The underlying surface of the wound has good granulation tissue. Patient History Information obtained from Patient. Family History Cancer - Maternal Grandparents, Diabetes - Father,Paternal Grandparents, Heart Disease - Maternal Grandparents, Hypertension Stanislav, Gervase ([id number]) [id number] Page 12 of 16 Grandparents, Lung Disease - Siblings, No family history of Hereditary Spherocytosis, Kidney Disease, Seizures, Stroke, Thyroid Problems, Tuberculosis. Social History Never smoker, Marital Status - Married, Alcohol Use - Never, Drug Use - No History, Caffeine Use - Daily - tea, soda. Medical History Eyes Denies history of Cataracts, Glaucoma, Optic Neuritis Ear/Nose/Mouth/Throat Denies history of Chronic sinus problems/congestion,  Middle ear problems Hematologic/Lymphatic Denies history of Anemia, Hemophilia, Human Immunodeficiency Virus, Lymphedema, Sickle Cell Disease Respiratory Patient has history of Asthma Denies history of Aspiration, Chronic Obstructive Pulmonary Disease (COPD), Pneumothorax, Sleep Apnea, Tuberculosis Cardiovascular Patient has history of Angina - with COVID, Hypertension Denies history of Arrhythmia, Congestive Heart Failure, Coronary Artery Disease, Deep Vein Thrombosis, Hypotension, Myocardial Infarction, Peripheral Arterial Disease, Peripheral Venous Disease, Phlebitis, Vasculitis Gastrointestinal Denies history of Cirrhosis , Colitis, Crohnoos, Hepatitis A, Hepatitis B, Hepatitis C Endocrine Denies history of Type I Diabetes, Type II Diabetes Genitourinary Denies history of End Stage Renal Disease Immunological Denies history of Lupus Erythematosus, Raynaudoos, Scleroderma Integumentary (Skin) Denies history of History of Burn Musculoskeletal Denies history of Gout, Rheumatoid Arthritis, Osteoarthritis, Osteomyelitis Neurologic Denies history of Dementia, Neuropathy, Quadriplegia, Paraplegia, Seizure Disorder Oncologic Denies history of Received Chemotherapy, Received Radiation Psychiatric Denies history of Anorexia/bulimia, Confinement Anxiety Hospitalization/Surgery History - COVID PNA 07/22/2019- 11/14/2019. - 03/27/2020 wound debridement/ skin graft. Medical A Surgical History Notes nd Constitutional Symptoms (General Health) COVID PNA 07/22/2019-11/14/2019 V[facility] ECMO, foot drop left foot , Genitourinary kidney stone Psychiatric anxiety Objective Constitutional no acute distress. Vitals Time Taken: 9:43 AM, Height: 69 in, Weight: 280 lbs, BMI: 41.3, Temperature: 98 F, Pulse: 73 bpm, Respiratory Rate: 18 breaths/min, Blood Pressure: 134/87 mmHg. Respiratory Normal work of breathing on room air. General Notes: 11/18/2022: We continue [name] struggle with moisture-related  tissue breakdown around the perimeter of his wound. This has caused the thin epithelium that had formed on the surface [name] disintegrate. The underlying surface of the wound has good granulation tissue. Integumentary (Hair, Skin) Wound #2 status is Open. Original cause of wound was Pressure Injury. The date acquired was: 10/07/2019. The wound has been in treatment 122 weeks. The wound is located on the Left Calcaneus.  The wound measures 4cm length x 1.3cm width x 0.1cm depth; 4.084cm^2 area and 0.408cm^3 volume. There is Fat Layer (Subcutaneous Tissue) exposed. There is no tunneling or undermining noted. There is a medium amount of serosanguineous drainage noted. The wound margin is thickened. There is medium (34-66%) red, pink granulation within the wound bed. There is a medium (34-66%) amount of necrotic tissue within the wound bed including Eschar and Adherent Slough. The periwound skin appearance had no abnormalities noted for color. The periwound skin appearance exhibited: Callus, Scarring, Maceration. The periwound skin appearance did not exhibit: Dry/Scaly. Periwound temperature was noted as No Abnormality. Assessment Active Problems ICD-10 [name], [name] ([id number]) [phone] Page 13 of 16 Non-pressure chronic ulcer of other part of left foot with fat layer exposed Procedures Wound #2 Pre-procedure diagnosis of Wound #2 is a Pressure Ulcer located on the Left Calcaneus . There was a Selective/Open Wound Non-Viable Tissue Debridement with a total area of 5.2 sq cm performed by [name], MD. With the following instrument(s): Curette [name] remove Non-Viable tissue/material. Material removed includes [facility] after achieving pain control using Lidocaine 5% topical ointment. No specimens were taken. A time out was conducted at 10:17, prior [name] the start of the procedure. A Minimum amount of bleeding was controlled with Pressure. The procedure was tolerated well with  a pain level of 0 throughout and a pain level of 0 following the procedure. Post Debridement Measurements: 4cm length x 1.3cm width x 0.1cm depth; 0.408cm^3 volume. Post debridement Stage noted as Category/Stage III. Character of Wound/Ulcer Post Debridement is improved. Post procedure Diagnosis Wound #2: Same as Pre-Procedure General Notes: Scribed for Dr. Lady Gary by J.Scotton. Plan Follow-up Appointments: Return Appointment in 1 week. - Dr. Lady Gary Room 3 Anesthetic: Wound #2 Left Calcaneus: (In clinic) Topical Lidocaine 4% applied [name] wound bed - In clinic Bathing/ Shower/ Hygiene: May shower and wash wound with soap and water. Edema Control - Lymphedema / SCD / Other: Avoid standing for long periods of time. Moisturize legs daily. - as needed Off-Loading: Other: - keep pressure off of the bottom of your feet. Elevate legs throughout the day. Use the Shoe with the PegAssist off-loading insole Additional Orders / Instructions: Follow Nutritious Diet - Try [name] get 70-100 grams of Protein a day+ WOUND #2: - Calcaneus Wound Laterality: Left Cleanser: Normal Saline (Generic) Every Other Day/30 Days Discharge Instructions: Cleanse the wound with Normal Saline prior [name] applying a clean dressing using gauze sponges, not tissue or cotton balls. Cleanser: Wound Cleanser (Generic) Every Other Day/30 Days Discharge Instructions: Cleanse the wound with wound cleanser prior [name] applying a clean dressing using gauze sponges, not tissue or cotton balls. Peri-Wound Care: Zinc Oxide Ointment 30g tube Every Other Day/30 Days Discharge Instructions: Apply Zinc Oxide [name] periwound with each dressing change Topical: Gentamicin Every Other Day/30 Days Discharge Instructions: As directed by physician Topical: compounding topical antibiotics from [facility] pharmacy Every Other Day/30 Days Discharge Instructions: apply directly [name] wound bed. (KEYSTONE) Prim Dressing: Endoform 2x2 in (Generic) Every Other Day/30  Days ary Discharge Instructions: Moisten with saline or Hydrogel Secondary Dressing: Drawtex 4x4 in Every Other Day/30 Days Discharge Instructions: Apply over primary dressing as directed. Secondary Dressing: Optifoam Non-Adhesive Dressing, 4x4 in (Generic) Every Other Day/30 Days Discharge Instructions: Apply over primary dressing as directed. Secondary Dressing: Woven Gauze Sponge, Non-Sterile 4x4 in (Generic) Every Other Day/30 Days Discharge Instructions: Apply over primary dressing as directed. Secured With: 4M Medipore H Soft Cloth Surgical T ape, 4 x 10 (  in/yd) (Generic) Every Other Day/30 Days Discharge Instructions: Secure with tape as directed. Com pression Wrap: Kerlix Roll 4.5x3.1 (in/yd) Every Other Day/30 Days Discharge Instructions: Apply Kerlix and Coban compression as directed. 11/18/2022: We continue [name] struggle with moisture-related tissue breakdown around the perimeter of his wound. This has caused the thin epithelium that had formed on the surface [name] disintegrate. The underlying surface of the wound has good granulation tissue. I used a curette [name] debride slough and moist skin from his wound. We are going [name] apply zinc oxide [name] the periwound and continue topical gentamicin [name] the wound bed along with endoform. He is going [name] remove his peg assist shoe whenever he is not ambulating and allow his foot [name] air out a bit during the day; he reports having very sweaty feet at baseline. Hopefully these measures will be the moisture related problems we are facing. Follow-up in 1 week. Electronic Signature(s) Signed: 11/22/2022 5:13:02 PM By: Shawn Stall RN, BSN Signed: 11/23/2022 7:43:54 AM By: [name] MD FACS Previous Signature: 11/18/2022 10:48:02 AM Version By: [name] MD FACS Entered By: Shawn [name] on 11/22/2022 16:58:40 [name] ([id number]) [id number] Page 14 of  16 -------------------------------------------------------------------------------- HxROS Details Patient Name: Date of Service: [name], [name] [name] 11/18/2022 9:30 A M Medical Record Number: [id number] Patient Account Number: [id number] Date of Birth/Sex: Treating RN: 10/13/1975 (47 y.o. M) Primary Care Provider: Dorinda Hill Other Clinician: Referring Provider: Treating Provider/Extender: Jana Hakim in Treatment: 122 Information Obtained From Patient Constitutional Symptoms (General Health) Medical History: Past Medical History Notes: COVID PNA 07/22/2019-11/14/2019 VENT ECMO, foot drop left foot , Eyes Medical History: Negative for: Cataracts; Glaucoma; Optic Neuritis Ear/Nose/Mouth/Throat Medical History: Negative for: Chronic sinus problems/congestion; Middle ear problems Hematologic/Lymphatic Medical History: Negative for: Anemia; Hemophilia; Human Immunodeficiency Virus; Lymphedema; Sickle Cell Disease Respiratory Medical History: Positive for: Asthma Negative for: Aspiration; Chronic Obstructive Pulmonary Disease (COPD); Pneumothorax; Sleep Apnea; Tuberculosis Cardiovascular Medical History: Positive for: Angina - with COVID; Hypertension Negative for: Arrhythmia; Congestive Heart Failure; Coronary Artery Disease; Deep Vein Thrombosis; Hypotension; Myocardial Infarction; Peripheral Arterial Disease; Peripheral Venous Disease; Phlebitis; Vasculitis Gastrointestinal Medical History: Negative for: Cirrhosis ; Colitis; Crohns; Hepatitis A; Hepatitis B; Hepatitis C Endocrine Medical History: Negative for: Type I Diabetes; Type II Diabetes Genitourinary Medical History: Negative for: End Stage Renal Disease Past Medical History Notes: kidney stone Immunological Medical History: Negative for: Lupus Erythematosus; Raynauds; Scleroderma Integumentary (Skin) Medical History: Negative for: History of Burn Musculoskeletal Medical History: Negative  for: Gout; Rheumatoid Arthritis; Osteoarthritis; Osteomyelitis TREVELL, PARISEAU ([id number]) [id number] Page 15 of 16 Neurologic Medical History: Negative for: Dementia; Neuropathy; Quadriplegia; Paraplegia; Seizure Disorder Oncologic Medical History: Negative for: Received Chemotherapy; Received Radiation Psychiatric Medical History: Negative for: Anorexia/bulimia; Confinement Anxiety Past Medical History Notes: anxiety Immunizations Pneumococcal Vaccine: Received Pneumococcal Vaccination: No Implantable Devices None Hospitalization / Surgery History Type of Hospitalization/Surgery COVID PNA 07/22/2019- 11/14/2019 03/27/2020 wound debridement/ skin graft Family and Social History Cancer: Yes - Maternal Grandparents; Diabetes: Yes - Father,Paternal Grandparents; Heart Disease: Yes - Maternal Grandparents; Hereditary Spherocytosis: No; Hypertension: Yes - Father,Paternal Grandparents; Kidney Disease: No; Lung Disease: Yes - Siblings; Seizures: No; Stroke: No; Thyroid Problems: No; Tuberculosis: No; Never smoker; Marital Status - Married; Alcohol Use: Never; Drug Use: No History; Caffeine Use: Daily - tea, soda; Financial Concerns: No; Food, Clothing or Shelter Needs: No; Support System Lacking: No; Transportation Concerns: No Electronic Signature(s) Signed: 11/18/2022 11:32:38 AM By: [name] MD FACS Entered By: [name] on  11/18/2022 10:42:30 -------------------------------------------------------------------------------- SuperBill Details Patient Name: Date of Service: [name], [name] [name] 11/18/2022 Medical Record Number: [id number] Patient Account Number: [id number] Date of Birth/Sex: Treating RN: 03/01/1976 (47 y.o. M) Primary Care Provider: Dorinda Hill Other Clinician: Referring Provider: Treating Provider/Extender: Jana Hakim in Treatment: 122 Diagnosis Coding ICD-10 Codes Code Description [phone]  Non-pressure chronic ulcer of other part of left foot with fat layer exposed Facility Procedures : CPT4 Code: [id number] Description: 97597 - DEBRIDE WOUND 1ST 20 SQ CM OR < ICD-10 Diagnosis Description L97.522 Non-pressure chronic ulcer of other part of left foot with fat layer exposed Modifier: Quantity: 1 Physician Procedures : CPT4 Code Description Modifier [id number] - WC PHYS LEVEL 4 - EST PT 25 ICD-10 Diagnosis Description L97.522 Non-pressure chronic ulcer of other part of left foot with fat layer exposed Quantity: 1 : [id number] 97597 - WC PHYS DEBR WO ANESTH 20 SQ CM [name], [name] ([id number]) [id number] ICD-10 Diagnosis Description L97.522 Non-pressure chronic ulcer of other part of left foot with fat layer exposed Quantity: 1 27.pdf Page 16 of 16 Electronic Signature(s) Signed: 11/18/2022 10:48:17 AM By: [name] MD FACS Entered By: [name] on 11/18/2022 10:48:17

## 2022-11-18 NOTE — Progress Notes (Signed)
[name] ([id number]) [phone].pdf Page 1 of 7 Visit Report for 11/18/2022 Arrival Information Details Patient Name: Date of Service: [name], TO [name] 11/18/2022 9:30 A M Medical Record Number: [id number] Patient Account Number: [id number] Date of Birth/Sex: Treating RN: [birth year] (47 y.o. [name] Primary Care Saverio Kader: Dorinda Hill Other Clinician: Referring Esta Carmon: Treating Maghan Jessee/Extender: Jana Hakim in Treatment: 122 Visit Information History Since Last Visit Added or deleted any medications: No Patient Arrived: Wheel Chair Any new allergies or adverse reactions: No Arrival Time: 09:42 Had a fall or experienced change in No Accompanied By: spouse activities of daily living that may affect Transfer Assistance: Manual risk of falls: Patient Requires Transmission-Based Precautions: No Signs or symptoms of abuse/neglect since last visito No Patient Has Alerts: No Hospitalized since last visit: No Implantable device outside of the clinic excluding No cellular tissue based products placed in the center since last visit: Has Dressing in Place as Prescribed: Yes Pain Present Now: No Electronic Signature(s) Signed: 11/18/2022 3:43:45 PM By: [name] RN Entered By: [name] on 11/18/2022 09:57:36 -------------------------------------------------------------------------------- Encounter Discharge Information Details Patient Name: Date of Service: [name], [name] 11/18/2022 9:30 A M Medical Record Number: [id number] Patient Account Number: [id number] Date of Birth/Sex: Treating RN: 1975-10-07 (47 y.o. [name] Primary Care Jessah Danser: Dorinda Hill Other Clinician: Referring Rama Sorci: Treating Lunah Losasso/Extender: Jana Hakim in Treatment: 122 Encounter Discharge Information Items Post Procedure Vitals Discharge Condition: Stable Temperature (F): 98 Ambulatory Status:  Wheelchair Pulse (bpm): 73 Discharge Destination: Home Respiratory Rate (breaths/min): 18 Transportation: Private Auto Blood Pressure (mmHg): 134/87 Accompanied By: spouse Schedule Follow-up Appointment: Yes Clinical Summary of Care: Patient Declined Electronic Signature(s) Signed: 11/18/2022 3:43:45 PM By: [name] RN Entered By: [name] on 11/18/2022 11:54:11 -------------------------------------------------------------------------------- Lower Extremity Assessment Details Patient Name: Date of Service: [name], TO [name] 11/18/2022 9:30 A M Medical Record Number: [id number] Patient Account Number: [id number] Date of Birth/Sex: Treating RN: 09-Jan-1976 (47 y.o. [name] Primary Care Moe Brier: Dorinda Hill Other Clinician: Referring Latrell Reitan: Treating Jaramie Bastos/Extender: Jana Hakim in Treatment: 122 Edema Assessment Assessed: [name]: No] Franne Forts: No] G[LeftKILLIAN, [name] ([id number])] [Right: [id number].pdf Page 2 of 7] Edema: [Left: N] [Right: o] Calf Left: Right: Point of Measurement: 29 cm From Medial Instep 43 cm Ankle Left: Right: Point of Measurement: 9 cm From Medial Instep 25 cm Vascular Assessment Pulses: Dorsalis Pedis Palpable: [Left:Yes] Electronic Signature(s) Signed: 11/18/2022 3:43:45 PM By: [name] RN Entered By: [name] on 11/18/2022 09:59:54 -------------------------------------------------------------------------------- Multi Wound Chart Details Patient Name: Date of Service: [name], [name] 11/18/2022 9:30 A M Medical Record Number: [id number] Patient Account Number: [id number] Date of Birth/Sex: Treating RN: 1976/07/20 (47 y.o. M) Primary Care Latavion [name]: Dorinda Hill Other Clinician: Referring Duyen Beckom: Treating Acelin Ferdig/Extender: Jana Hakim in Treatment: 122 Vital Signs Height(in): 69 Pulse(bpm): 73 Weight(lbs): 280 Blood Pressure(mmHg):  134/87 Body Mass Index(BMI): 41.3 Temperature(F): 98 Respiratory Rate(breaths/min): 18 [2:Photos:] [N/A:N/A] Left Calcaneus N/A N/A Wound Location: Pressure Injury N/A N/A Wounding Event: Pressure Ulcer N/A N/A Primary Etiology: Asthma, Angina, Hypertension N/A N/A Comorbid History: 10/07/2019 N/A N/A Date Acquired: 122 N/A N/A Weeks of Treatment: Open N/A N/A Wound Status: No N/A N/A Wound Recurrence: 4x1.3x0.1 N/A N/A Measurements L x W x D (cm) 4.084 N/A N/A A (cm) : rea 0.408 N/A N/A Volume (cm) : 84.90% N/A N/A % Reduction in A rea: 98.50% N/A N/A % Reduction in  Volume: Category/Stage III N/A N/A Classification: Medium N/A N/A Exudate A mount: Serosanguineous N/A N/A Exudate Type: red, brown N/A N/A Exudate Color: Thickened N/A N/A Wound Margin: Medium (34-66%) N/A N/A Granulation A mount: Red, Pink N/A N/A Granulation Quality: Medium (34-66%) N/A N/A Necrotic A mount: Eschar, Adherent Slough N/A N/A Necrotic Tissue: Fat Layer (Subcutaneous Tissue): Yes N/A N/A Exposed Structures: Fascia: No Tendon: No [name] ([id number]) [id number].pdf Page 3 of 7 Muscle: No Joint: No Bone: No Medium (34-66%) N/A N/A Epithelialization: Debridement - Selective/Open Wound N/A N/A Debridement: Pre-procedure Verification/Time Out 10:17 N/A N/A Taken: Lidocaine 5% topical ointment N/A N/A Pain Control: Slough N/A N/A Tissue Debrided: Non-Viable Tissue N/A N/A Level: 5.2 N/A N/A Debridement A (sq cm): rea Curette N/A N/A Instrument: Minimum N/A N/A Bleeding: Pressure N/A N/A Hemostasis A chieved: 0 N/A N/A Procedural Pain: 0 N/A N/A Post Procedural Pain: Procedure was tolerated well N/A N/A Debridement Treatment Response: 4x1.3x0.1 N/A N/A Post Debridement Measurements L x W x D (cm) 0.408 N/A N/A Post Debridement Volume: (cm) Category/Stage III N/A N/A Post Debridement Stage: Callus: Yes N/A N/A Periwound  Skin Texture: Scarring: Yes Maceration: Yes N/A N/A Periwound Skin Moisture: Dry/Scaly: No No Abnormalities Noted N/A N/A Periwound Skin Color: No Abnormality N/A N/A Temperature: Debridement N/A N/A Procedures Performed: Treatment Notes Electronic Signature(s) Signed: 11/18/2022 10:40:27 AM By: [name] MD FACS Entered By: [name] on 11/18/2022 10:40:27 -------------------------------------------------------------------------------- Multi-Disciplinary Care Plan Details Patient Name: Date of Service: [name], [name] 11/18/2022 9:30 A M Medical Record Number: [id number] Patient Account Number: [id number] Date of Birth/Sex: Treating RN: [birth year] (47 y.o. [name] Primary Care Mireille Lacombe: Dorinda Hill Other Clinician: Referring Sher Shampine: Treating Glendale Youngblood/Extender: Jana Hakim in Treatment: 122 Multidisciplinary Care Plan reviewed with physician Active Inactive Wound/Skin Impairment Nursing Diagnoses: Impaired tissue integrity Knowledge deficit related to ulceration/compromised skin integrity Goals: Patient/caregiver will verbalize understanding of skin care regimen Date Initiated: 07/15/2020 Target Resolution Date: 05/08/2023 Goal Status: Active Ulcer/skin breakdown will have a volume reduction of 30% by week 4 Date Initiated: 07/15/2020 Date Inactivated: 08/20/2020 Target Resolution Date: 09/03/2020 Goal Status: Unmet Unmet Reason: no major changes. Ulcer/skin breakdown will heal within 14 weeks Date Initiated: 12/04/2020 Date Inactivated: 12/10/2020 Target Resolution Date: 12/10/2020 Unmet Reason: wounds still open at 14 Goal Status: Unmet weeks and today 21 weeks. Interventions: Assess patient/caregiver ability to obtain necessary supplies Assess patient/caregiver ability to perform ulcer/skin care regimen upon admission and as needed Assess ulceration(s) every visit Provide education on ulcer and skin care Treatment  Activities: RAMZEY, PETROVIC ([id number]) [phone].pdf Page 4 of 7 Skin care regimen initiated : 07/15/2020 Topical wound management initiated : 07/15/2020 Notes: Electronic Signature(s) Signed: 11/18/2022 3:43:45 PM By: [name] RN Entered By: [name] on 11/18/2022 11:52:51 -------------------------------------------------------------------------------- Pain Assessment Details Patient Name: Date of Service: [name], TO [name] 11/18/2022 9:30 A M Medical Record Number: [id number] Patient Account Number: [id number] Date of Birth/Sex: Treating RN: 10-07-1975 (47 y.o. [name] Primary Care Rohin Krejci: Dorinda Hill Other Clinician: Referring Brittne Kawasaki: Treating Darlette Dubow/Extender: Jana Hakim in Treatment: 122 Active Problems Location of Pain Severity and Description of Pain Patient Has Paino Yes Site Locations Pain Location: Generalized Pain With Dressing Change: Yes Duration of the Pain. Constant / Intermittento Constant Rate the pain. Current Pain Level: 6 Worst Pain Level: 10 Least Pain Level: 3 Tolerable Pain Level: 3 Pain Management and Medication Current Pain Management: Medication: Yes Cold Application: No Rest: Yes  Massage: No Activity: No T.E.N.S.: No Heat Application: No Leg drop or elevation: No Is the Current Pain Management Adequate: Adequate How does your wound impact your activities of daily livingo Sleep: No Bathing: No Appetite: No Relationship With Others: No Bladder Continence: No Emotions: No Bowel Continence: No Work: No Toileting: No Drive: No Dressing: No Hobbies: No Electronic Signature(s) Signed: 11/18/2022 3:43:45 PM By: [name] RN Entered By: [name] on 11/18/2022 09:59:35 -------------------------------------------------------------------------------- Patient/Caregiver Education Details Patient Name: Date of Service: Mart Piggs  4/12/2024andnbsp9:30 A M Medical Record Number: [id number] Patient Account Number: [id number] Date of Birth/Gender: Treating RN: [birth year] (47 y.o. [name] Primary Care Physician: Dorinda Hill Other Clinician: [name] ([id number]) [id number] Page 5 of 7 Referring Physician: Treating Physician/Extender: Jana Hakim in Treatment: 122 Education Assessment Education Provided To: Patient Education Topics Provided Wound/Skin Impairment: Methods: Explain/Verbal Responses: Return demonstration correctly Electronic Signature(s) Signed: 11/18/2022 3:43:45 PM By: [name] RN Entered By: [name] on 11/18/2022 11:53:06 -------------------------------------------------------------------------------- Wound Assessment Details Patient Name: Date of Service: [name], TO [name] 11/18/2022 9:30 A M Medical Record Number: [id number] Patient Account Number: [id number] Date of Birth/Sex: Treating RN: [birth year] (47 y.o. [name] Primary Care Timia Casselman: Dorinda Hill Other Clinician: Referring Jolly Bleicher: Treating Kavin Weckwerth/Extender: Jana Hakim in Treatment: 122 Wound Status Wound Number: 2 Primary Etiology: Pressure Ulcer Wound Location: Left Calcaneus Wound Status: Open Wounding Event: Pressure Injury Comorbid History: Asthma, Angina, Hypertension Date Acquired: 10/07/2019 Weeks Of Treatment: 122 Clustered Wound: No Photos Wound Measurements Length: (cm) 4 Width: (cm) 1.3 Depth: (cm) 0.1 Area: (cm) 4.084 Volume: (cm) 0.408 % Reduction in Area: 84.9% % Reduction in Volume: 98.5% Epithelialization: Medium (34-66%) Tunneling: No Undermining: No Wound Description Classification: Category/Stage III Wound Margin: Thickened Exudate Amount: Medium Exudate Type: Serosanguineous Exudate Color: red, brown Foul Odor After Cleansing: No Slough/Fibrino Yes Wound Bed Granulation Amount:  Medium (34-66%) Exposed Structure Granulation Quality: Red, Pink Fascia Exposed: No Necrotic Amount: Medium (34-66%) Fat Layer (Subcutaneous Tissue) Exposed: Yes Necrotic Quality: Eschar, Adherent Slough Tendon Exposed: No TRUMON, BERDING ([id number]) [phone].pdf Page 6 of 7 Muscle Exposed: No Joint Exposed: No Bone Exposed: No Periwound Skin Texture Texture Color No Abnormalities Noted: No No Abnormalities Noted: Yes Callus: Yes Temperature / Pain Scarring: Yes Temperature: No Abnormality Moisture No Abnormalities Noted: No Dry / Scaly: No Maceration: Yes Treatment Notes Wound #2 (Calcaneus) Wound Laterality: Left Cleanser Normal Saline Discharge Instruction: Cleanse the wound with Normal Saline prior to applying a clean dressing using gauze sponges, not tissue or cotton balls. Wound Cleanser Discharge Instruction: Cleanse the wound with wound cleanser prior to applying a clean dressing using gauze sponges, not tissue or cotton balls. Peri-Wound Care Zinc Oxide Ointment 30g tube Discharge Instruction: Apply Zinc Oxide to periwound with each dressing change Topical Gentamicin Discharge Instruction: As directed by physician compounding topical antibiotics from [facility] pharmacy Discharge Instruction: apply directly to wound bed. (KEYSTONE) Primary Dressing Endoform 2x2 in Discharge Instruction: Moisten with saline or Hydrogel Secondary Dressing Drawtex 4x4 in Discharge Instruction: Apply over primary dressing as directed. Optifoam Non-Adhesive Dressing, 4x4 in Discharge Instruction: Apply over primary dressing as directed. Woven Gauze Sponge, Non-Sterile 4x4 in Discharge Instruction: Apply over primary dressing as directed. Secured With 52M Medipore H Soft Cloth Surgical T ape, 4 x 10 (in/yd) Discharge Instruction: Secure with tape as directed. Compression Wrap Kerlix Roll 4.5x3.1 (in/yd) Discharge Instruction: Apply Kerlix and Coban  compression as directed. Compression Stockings  Add-Ons Electronic Signature(s) Signed: 11/18/2022 3:43:45 PM By: [name] RN Entered By: [name] on 11/18/2022 10:21:11 -------------------------------------------------------------------------------- Vitals Details Patient Name: Date of Service: [name], [name] 11/18/2022 9:30 A M Medical Record Number: [id number] Patient Account Number: [id number] Date of Birth/Sex: Treating RN: [birth year] (47 y.o. [name] Primary Care Marea Reasner: Dorinda Hill Other Clinician: Referring Munachimso Palin: Treating Deandrew Hoecker/Extender: Ladarrion, [place] ([id number]) [id number] Page 7 of 7 Weeks in Treatment: 122 Vital Signs Time Taken: 09:43 Temperature (F): 98 Height (in): 69 Pulse (bpm): 73 Weight (lbs): 280 Respiratory Rate (breaths/min): 18 Body Mass Index (BMI): 41.3 Blood Pressure (mmHg): 134/87 Reference Range: 80 - 120 mg / dl Electronic Signature(s) Signed: 11/18/2022 3:43:45 PM By: [name] RN Entered By: [name] on 11/18/2022 09:58:54

## 2022-11-21 ENCOUNTER — Other Ambulatory Visit (INDEPENDENT_AMBULATORY_CARE_PROVIDER_SITE_OTHER): Payer: Self-pay | Admitting: Family Medicine

## 2022-11-21 ENCOUNTER — Ambulatory Visit (INDEPENDENT_AMBULATORY_CARE_PROVIDER_SITE_OTHER): Payer: PPO | Admitting: Family Medicine

## 2022-11-21 ENCOUNTER — Encounter (INDEPENDENT_AMBULATORY_CARE_PROVIDER_SITE_OTHER): Payer: Self-pay

## 2022-11-21 DIAGNOSIS — E119 Type 2 diabetes mellitus without complications: Secondary | ICD-10-CM

## 2022-11-25 ENCOUNTER — Encounter (HOSPITAL_BASED_OUTPATIENT_CLINIC_OR_DEPARTMENT_OTHER): Payer: PPO | Admitting: General Surgery

## 2022-11-25 DIAGNOSIS — L97522 Non-pressure chronic ulcer of other part of left foot with fat layer exposed: Secondary | ICD-10-CM | POA: Diagnosis not present

## 2022-11-25 DIAGNOSIS — L89623 Pressure ulcer of left heel, stage 3: Secondary | ICD-10-CM | POA: Diagnosis not present

## 2022-11-26 NOTE — Progress Notes (Signed)
[name], [name] ([id number]) [id number] Page 1 of 16 Visit Report for 11/25/2022 Chief Complaint Document Details Patient Name: Date of Service: [name], TO [name] 11/25/2022 11:00 A M Medical Record Number: [id number] Patient Account Number: [id number] Date of Birth/Sex: Treating RN: January 20, 1976 (47 y.o. M) Primary Care Provider: Dorinda Hill Other Clinician: Referring Provider: Treating Provider/Extender: Jana Hakim in Treatment: 123 Information Obtained from: Patient Chief Complaint Bilateral Plantar Foot Ulcers Electronic Signature(s) Signed: 11/25/2022 12:18:27 PM By: [name] MD FACS Entered By: [name] on 11/25/2022 12:18:27 -------------------------------------------------------------------------------- Debridement Details Patient Name: Date of Service: [name], [name] 11/25/2022 11:00 A M Medical Record Number: [id number] Patient Account Number: [id number] Date of Birth/Sex: Treating RN: [birth year] (46 y.o. [name] Primary Care Provider: Dorinda Hill Other Clinician: Referring Provider: Treating Provider/Extender: Jana Hakim in Treatment: 123 Debridement Performed for Assessment: Wound #2 Left Calcaneus Performed By: Physician [name], MD Debridement Type: Debridement Level of Consciousness (Pre-procedure): Awake and Alert Pre-procedure Verification/Time Out Yes - 11:20 Taken: Start Time: 11:20 Pain Control: Lidocaine 5% topical ointment T Area Debrided (L x W): otal 4 (cm) x 1.1 (cm) = 4.4 (cm) Tissue and other material debrided: Non-Viable, Slough, Slough Level: Non-Viable Tissue Debridement Description: Selective/Open Wound Instrument: Curette Bleeding: Minimum Hemostasis Achieved: Pressure End Time: 11:22 Procedural Pain: 0 Post Procedural Pain: 0 Response to Treatment: Procedure was tolerated well Level of Consciousness (Post- Awake and  Alert procedure): Post Debridement Measurements of Total Wound Length: (cm) 4 Stage: Category/Stage III Width: (cm) 1.1 Depth: (cm) 0.1 Volume: (cm) 0.346 Character of Wound/Ulcer Post Debridement: Improved Post Procedure Diagnosis Same as Pre-procedure Notes Scribed for Dr. Lady Gary by J.Scotton Electronic Signature(s) Signed: 11/25/2022 12:37:49 PM By: [name] MD FACS Signed: 11/25/2022 5:08:39 PM By: [name] RN [name] ([id number]) [id number] Page 2 of 16 Entered By: [name] on 11/25/2022 11:26:31 -------------------------------------------------------------------------------- HPI Details Patient Name: Date of Service: [name], TO [name] 11/25/2022 11:00 A M Medical Record Number: [id number] Patient Account Number: [id number] Date of Birth/Sex: Treating RN: 05-18-1976 (47 y.o. M) Primary Care Provider: Dorinda Hill Other Clinician: Referring Provider: Treating Provider/Extender: Jana Hakim in Treatment: 123 History of Present Illness HPI Description: Wounds are12/03/2020 upon evaluation today patient presents for initial inspection here in our clinic concerning issues he has been having with the bottoms of his feet bilaterally. He states these actually occurred as wounds when he was hospitalized for 5 months secondary to Covid. He was apparently with tilting bed where he was in an upright position quite frequently and apparently this occurred in some way shape or form during that time. Fortunately there is no sign of active infection at this time. No fevers, chills, nausea, vomiting, or diarrhea. With that being said he still has substantial wounds on the plantar aspects of his feet Theragen require quite a bit of work to get these to heal. He has been using Santyl currently though that is been problematic both in receiving the medication as well as actually paid for it as it is become quite expensive.  Prior to the experience with Covid the patient really did not have any major medical problems other than hypertension he does have some mild generalized weakness following the Covid experience. 07/22/2020 on evaluation today patient appears to be doing okay in regard to his foot ulcers I feel like the wound beds are showing signs of better improvement that I do believe the Iodoflex is  helping in this regard. With that being said he does have a lot of drainage currently and this is somewhat blue/green in nature which is consistent with Pseudomonas. I do think a culture today would be appropriate for [place] to evaluate and see if that is indeed the case I would likely start him on antibiotic orally as well he is not allergic to Cipro knows of no issues he has had in the past 12/21; patient was admitted to the clinic earlier this month with bilateral presumed pressure ulcers on the bottom of his feet apparently related to excessive pressure from a tilt table arrangement in the intensive care unit. Patient relates this to being on ECMO but I am not really sure that is exactly related to that. I must say I have never seen anything like this. He has fairly extensive full-thickness wounds extending from his heel towards his midfoot mostly centered laterally. There is already been some healing distally. He does not appear to have an arterial issue. He has been using gentamicin to the wound surfaces with Iodoflex to help with ongoing debridement 1/6; this is a patient with pressure ulcers on the bottom of his feet related to excessive pressure from a standing position in the intensive care unit. He is complaining of a lot of pain in the right heel. He is not a diabetic. 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 We have been using Iodoflex to help prepare the surfaces of both wounds for an advanced treatment product. He is nonambulatory spending most of his time in a wheelchair I have asked  him not to propel the wheelchair with his heels 1/13; in general his wounds look better not much surface area change we have been using Iodoflex as of last week. I did an x-ray of the right heel as the patient was complaining of pain. I had some thoughts about a stress fracture perhaps Achilles tendon problems however what it showed was erosive changes along the inferior aspect of the calcaneus he now has a MRI booked for 1/20. 1/20; in general his wounds continue to be better. Some improvement in the large narrow areas proximally in his foot. He is still complaining of pain in the right heel and tenderness in certain areas of this wound. His MRI is tonight. I am not just looking for osteomyelitis that was brought up on the x-ray I am wondering about stress fractures, tendon ruptures etc. He has no such findings on the left. Also noteworthy is that the patient had critical illness neuropathy and some of the discomfort may be actual improvement in nerve function I am just not sure. These wounds were initially in the setting of severe critical illness related to COVID-19. He was put in a standing position. He may have also been on pressors at the point contributing to tissue ischemia. By his description at some point these wounds were grossly necrotic extending proximally up into the Achilles part of his heel. I do not know that I have ever really seen pictures of them like this although they may exist in epic We have ordered Tri layer Oasis. I am trying to stimulate some granulation in these areas. This is of course assuming the MRI is negative for infection 1/27; since the patient was last here he saw Dr. Earlene Plater of infectious disease. He is planned for vancomycin and ceftriaxone. Prior operative culture grew MSSA. Also ordered baseline lab work. He also ordered arterial studies although the ABIs in our clinic  were normal as well as his clinical exam these were normal I do not think he needs to  see vascular surgery. His ABIs at the [facility] were 1.22 in the right triphasic waveforms with a normal TBI of 1.15 on the left ABI of 1.22 with triphasic waveforms and a normal TBI of 1.08. Finally he saw Dr. Logan Bores who will follow him in for 2 months. At this point I do not think he felt that he needed a procedure on the right calcaneal bone. Dr. Earlene Plater is elected for broad-spectrum antibiotic The patient is still having pain in the right heel. He walks with a walker 2/3; wounds are generally smaller. He is tolerating his IV antibiotics. I believe this is vancomycin and ceftriaxone. We are still waiting for Oasis burn in terms of his out-of-pocket max which he should be meeting soon given the IV antibiotics, MRIs etc. I have asked him to check in on this. We are using silver collagen in the meantime the wounds look better 2/10; tolerating IV vancomycin and Rocephin. We are waiting to apply for Oasis. Although I am not really sure where he is in his out-of-pocket max. 2/17 started the first application of Oasis trilayer. Still on antibiotics. The wounds have generally look better. The area on the left has a little more surface slough requiring debridement 2/24; second application of Oasis trilayer. The wound surface granulation is generally look better. The area on the left with undermining laterally I think is come in a bit. 10/08/2020 upon evaluation today patient is here today for [employer] application #3. Fortunately he seems to be doing extremely well with regard to this and we are seeing a lot of new epithelial growth which is great news. Fortunately there is no signs of active infection at this time. 10/16/2020 upon evaluation today patient appears to be doing well with regard to his foot ulcers. Do believe the Oasis has been of benefit for him. I do not see any signs of infection right now which is great news and I think that he has a lot of new epithelial growth which is great to see as well.  The patient is very pleased to hear all of this. I do think we can proceed with the Oasis trilayer #4 today. 3/18; not as much improvement in these areas on his heels that I was hoping. I did reapply trilateral Oasis today the tissue looks healthier but not as much fill in as I was hoping. 3/25; better looking today I think this is come in a bit the tissue looks healthier. Triple layer Oasis reapplied #6 4/1; somewhat better looking definitely better looking surface not as much change in surface area as I was hoping. He may be spending more time [place] on days then he needs to although he does have heel offloading boots. Triple layer Oasis reapplied #7 4/7; unfortunately apparently [facility] will not approve any further Oasis which is unfortunate since the patient did respond nicely both in terms of the condition of the wound bed as well as surface area. There is still some drainage coming from the wound but not a lot there does not appear to be any KIERNAN, ATKERSON ([id number]) [id number] Page 3 of 16 infection 4/15; we have been using Hydrofera Blue. He continues to have nice rims of epithelialization on the right greater than the left. The left the epithelialization is coming from the tip of his heel. There is moderate drainage. In this that concerns me  about a total contact cast. There is no evidence of infection 4/29; patient has been using Hydrofera Blue with dressing changes. He has no complaints or issues today. 5/5; using Hydrofera Blue. I actually think that he looks marginally better than the last time I saw this 3 weeks ago. There are rims of epithelialization on the left thumb coming from the medial side on the right. Using Hydrofera Blue 5/12; using Hydrofera Blue. These continue to make improvements in surface area. His drainage was not listed as severe I therefore went ahead and put a cast on the left foot. Right foot we will continue to dress  his previous 5/16; back for first total contact cast change. He did not tolerate this particularly well cast injury on the anterior tibia among other issues. Difficulty sleeping. I talked him about this in some detail and afterwards is elected to continue. I told him I would like to have a cast on for 3 weeks to see if this is going to help at all. I think he agreed 5/19; I think the wound is better. There is no tunneling towards his midfoot. The undermining medially also looks better. He has a rim of new skin distally. I think we are making progress here. The area on the left also continues to look somewhat better to me using Hydrofera Blue. He has a list of complaints about the cast but none of them seem serious 5/26; patient presents for 1 week follow-up. He has been using a total contact cast and tolerating this well. Hydrofera Blue is the main dressing used. He denies signs of infection. 6/2 Hydrofera Blue total contact cast on the left. These were large ulcers that formed in intensive care unit where the patient was recovering from COVID. May have had something to do with being ventilated in an upright positiono Pressors etc. We have been able to get the areas down considerably and a viable surface. There is some epithelialization in both sides. Note made of drainage 6/9; changed to [facility] last time because of drainage. He arrives with better looking surfaces and dimensions on the left than the right. Paradoxically the right actually probes more towards his midfoot the left is largely close down but both of these look improved. Using a total contact cast on the left 6/16; complex wounds on his bilateral plantar heels which were initially pressure injury from a stay in the ICU with COVID. We have been using silver alginate most recently. His dimensions of come in quite dramatically however not recently. We have been putting the left foot in a total contact cast 6/23; complex wounds on  the bilateral plantar heels. I been putting the left in the cast paradoxically the area on the right is the one that is going towards closure at a faster rate. Quite a bit of drainage on the left. The patient went to see Dr. Logan Bores who said he was going to standby for skin grafts. I had actually considered sending him for skin grafts however he would be mandatorily off his feet for a period of weeks to months. I am thinking that the area on the right is going to close on its own the area on the left has been more stubborn even though we have him in a total contact cast 6/30; took him out of a total contact cast last week is the right heel seem to be making better progress than the left where I was placing the cast. We are using silver alginate.  Both wounds are smaller right greater than left 7/12; both wounds look as though they are making some progress. We are using silver alginate. Heel offloading boots 7/26; very gradual progress especially on the right. Using silver alginate. He is wearing heel offloading boots 8/18; he continues to close these wounds down very gradually. Using silver alginate. The problem polymen being definitive about this is areas of what appears to be callus around the margins. This is not a 100% of the area but certainly sizable especially on the right 9/1; bilateral plantar feet wounds secondary to prolonged pressure while being ventilated for COVID-19 in an upright position. Essentially pressure ulcers on the bottom of his feet. He is made substantial progress using silver alginate. 9/14; bilateral plantar feet wounds secondary to prolonged pressure. Making progress using silver alginate. 9/29 bilateral plantar feet wounds secondary to prolonged pressure. I changed him to Iodoflex last week. MolecuLight showing reddened blush fluorescence 10/11; patient presents for follow-up. He has no issues or complaints today. He denies signs of infection. He continues to use Iodoflex  and antibiotic ointment to the wound beds. 10/27; 2-week follow-up. No evidence of infection. He has callus and thick dry skin around the wound margins we have been using Iodoflex and Bactroban which was in response to a moderate left MolecuLight reddish blush fluorescence. 11/10; 2-week follow-up. Wound margins again have thick callus however the measurements of the actual wound sites are a lot smaller. Everything looks reasonably healthy here. We have been using Iodoflex He was approved for prime matrix but I have elected to delay this given the improvement in the surface area. Hopefully I will not regret that decision as were getting close to the end of the year in terms of insurance payment 12/8; 2-week follow-up. Wounds are generally smaller in size. These were initially substantial wounds extending into the forefoot all the way into the heel on the bilateral plantar feet. They are now both located on the plantar heel distal aspect both of these have a lot of callus around the wounds I used a #5 curette to remove this on the right and the left also some subcutaneous debris to try and get the wound edges were using Iodoflex. He has heel offloading shoe 12/22; 2-week follow-up. Not really much improvement. He has thick callus around the outer edges of both wounds. I remove this there is some nonviable subcutaneous tissue as well. We have been using Iodoflex. Her intake nurse and myself spontaneously thought of a total contact cast I went back in May. At that time we really were not seeing much of an improvement with a cast although the wound was in a much different situation I would like to retry this in 2 weeks and I discussed this with the patient 08/12/2021; the patient has had some improvement with the Iodoflex. The the area on the left heel plantar more improved than the right. I had to put him in a total contact cast on the left although I decided to put that off for 2 weeks. I am going to  change his primary dressing to silver collagen. I think in both areas he has had some improvement most of the healing seems to be more proximal in the heel. The wounds are in the mid aspect. A lot of thick callus on the right heel however. 1/19; we are using silver collagen on both plantar heel areas. He has had some improvement today. The left did not require any debridement. He still had some  eschar on the right that was debrided but both seem to have contracted. I did not put it total contact cast on him today 2/2 we have been using silver collagen. The area on the right plantar heel has areas that appear to be epithelialized interspersed with dry flaking callus and dry skin. I removed this. This really looks better than on the other side. On the left still a large area with raised edges and debris on the surface. 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 He has no issues or complaints today. 2/9; not much change in the left foot wound with 1 week of a cast we are using silver collagen. Silver collagen on the right side. The right side has been the better wound surface. We will reapply the total contact cast on the left 2/16; not much improvement on either side I been using silver collagen with a total contact cast on the left. I'm changing the Hydrofera Blue still with a total contact cast on the left 2/23; some improvement on both sides. Disappointing that he has thick callus around the area that we are putting in a total contact cast on the left. We've been using Hydrofera Blue on both wound areas. This is a man who at essentially pressure ulcers in addition to ischemia caused by medications to support his blood pressure (pressors) in the ICU. He was being ventilated in the standing position for severe Covid. A Shiley the wounds extended across his entire foot but  are now localized to his plantar heels bilaterally. We have made progress however neither areas healed. I continue to think the total contact cast is helped albeit painstakingly slowly. He has never wanted a plastic surgery consult although I don't know that they would be interested in grafting in area in this location. 10/07/2021: Continued improvement bilaterally. There is still some callus around the left wound, despite the total contact cast. He has some increased pain in his right midfoot around 1 particular area. This has been painful in the past but seems to be a little bit worse. When his cast was removed today, there was an area on the heel of the left foot that looks a bit macerated. He is also complaining of pain in his left thigh and hip which he thinks is secondary to the limb length discrepancy caused by the cast. 10/14/2021: He continues to improve. A little bit less callus around the left wound. He continues to endorse pain in his right midfoot, but this is not as significant as it was last week. The maceration on his left heel is improved. 10/21/2021: Continued improvement to both wounds. The maceration on his left heel is no longer evident. Less callus bilaterally. Epithelialization progressing. SPENCER, CARDINAL ([id number]) [id number] Page 4 of 16 10/28/2021: Significant improvement this week. The right sided wound is nearly closed with just a small open area at the middle. No maceration seen on the left heel. Continued epithelialization on both sides. No concern for infection. 11/04/2021: T oday, the wounds were measured a little bit differently and come out as larger, but I actually think they are about the same to potentially even smaller, particularly on the left. He continues to accumulate some callus on the right. 11/11/2021: T oday, the patient is expressing some concern that the left wound, despite being in the total contact cast, is not  progressing at the  same rate as the 1 on the right. He is interested in trying a week without the cast to see how the wound does. The wounds are roughly the same size as last week, with the right perhaps being a little bit smaller. He continues to build up callus on both sites. 11/18/2021: Last week, I permitted the patient to go without his total contact cast, just to see if the cast was really making any difference. Today, both wounds have deteriorated to some extent, suggesting that the cast is providing benefit, at least on the left. Both are larger and have accumulated callus, slough, and other debris. 11/26/2021: I debrided both wounds quite aggressively last week in an effort to stimulate the healing cascade. This appears to have been effective as the left sided wound is a full centimeter shorter in length. Although the right was measured slightly larger, on inspection, it looks as though an area of epithelialized tissue was included in the measurements. We have been using PolyMem Ag on the wound surfaces with a total contact cast to the left. 12/02/2021: It appears that the intake personnel are including epithelialized tissue in his wound measurements; the right wound is almost completely epithelialized; there is just a crater at the proximal midfoot with some open areas. On the left, he has built up some callus, but the overall wound surface looks good. There is some senescent skin around the wound margin. He has been in PolyMem Ag bilaterally with a total contact cast on the left. 12/09/2021: The right wound is nearly closed; there is just a small open area at the mid calcaneus. On the left, the wound is smaller with minimal callus buildup. No significant drainage. 12/16/2021: The right calcaneal wound remains minimally open at the mid calcaneus; the rest has epithelialized. On the left, the wound is also a little bit smaller. There is some senescent tissue on the periphery. He is getting his first application of a  trial skin substitute called Vendaje today. 12/23/2021: The wound on his right calcaneus is nearly closed; there is just a small area at the most distal aspect of the calcaneus that is open. On the left, the area where we applied to the skin substitute has a healthier-looking bed of granulation tissue. The wound dimensions are not significantly different on this side but the wound surface is improved. 12/30/2021: The wound on the right calcaneus has not changed significantly aside from some accumulation of callus. On the left, the open area is smaller and continues to have an improved surface. He continues to accumulate callus around the wound. He is here for his third application of Vendaje. 01/06/2022: The right calcaneal wound is down to just a couple of millimeters. He continues to accumulate periwound callus. He unfortunately got his cast wet earlier in the week and his left foot is macerated, resulting in some superficial skin loss just distal to the open wound. The open wound itself, however, is much smaller and has a healthier appearing surface. He is here for his fourth application of Vendaje. 01/13/2022: The right calcaneal wound is about the same. Unfortunately, once again, his cast got wet and his foot is again macerated. This is caused the left calcaneal wound to enlarge. He is here for his fifth application of Vendaje. 01/20/2022: The right calcaneal wound is very small. There is some periwound callus accumulation. He purchased a new cast protector last week and this has been effective in avoiding the maceration that has been  occurring on the left. The left calcaneal wound is narrower and has a healthy and viable-appearing surface. He is here for his 6 application of Vendaje. 01/27/2022: The right calcaneal wound is down to just a pinhole. There is some periwound slough and callus. On the left, the wound is narrower and shorter by about a centimeter. The surface is robust and viable-appearing.  Unfortunately, the rep for the trial skin substitute product did not provide any for [place] to use today. 02/04/2022: The right calcaneal wound remains unchanged. There is more accumulated callus. On the left, although the intake nurse measured it a little bit longer, it looks about the same to me. The surface has a layer of slough, but underneath this, there is good granulation tissue. 02/10/2022: The right calcaneus wound is nearly closed. There is still some callus that builds up around the site. The left side looks about the same in terms of dimensions, but the surface is more robust and vital-appearing. 02/16/2022: The area of the right calcaneus that was nearly closed last week has closed, but there is a small opening at the mid foot where it looks like some moisture got retained and caused some reopening. The left foot wound is narrower and shallower. Both sites have a fair amount of periwound callus and eschar. 02/24/2022: The small midfoot opening on the right calcaneus is a little bit smaller today. The left foot wound is narrower and shallower. He continues to accumulate periwound callus. No concern for infection. 03/01/2022: The patient came to clinic early because he showered and got his cast wet. Fortunately, there is no significant maceration to his foot but the callus softened and it looks like the wound on his left calcaneus may be a little bit wider. The wound on his right calcaneus is just a narrow slit. Continued accumulation of periwound callus bilaterally. 03/08/2022: The wound on his right calcaneus is very nearly closed, just a small pinpoint opening under a bit of eschar; the left wound has come in quite a bit, as well. It is narrower and shorter than at our last visit. Still with accumulated callus and eschar bilaterally. 03/17/2022: The right calcaneal wound is healed. The left wound is smaller and the surface itself is very clean, but there is some blue-green staining on the periwound  callus, concerning for Pseudomonas aeruginosa. 03/23/2022: The right calcaneal wound remains closed. The left wound continues to contract. No further blue-green staining. Small amount of callus and slough accumulation. 03/28/2022: He came in early today because he had gotten his cast wet. On inspection, the wound itself did not get wet or macerated, just a little bit of the forefoot. The wound itself is basically unchanged. 04/07/2022: The right foot wound remains closed. The left wound is the smallest that I have seen it to date. It is narrower and shorter. It still continues to accumulate slough on the surface. 04/15/2022: There is a band of epithelium now dividing the small left plantar foot wound in 2. There is still some slough on the surface. 04/21/2022: The wound continues to narrow. Just a little bit of slough on the surface. He seems to be responding well to endoform. 04/28/2022: Continued slow contraction of the wound. There is a little slough on the surface and some periwound callus. We have been using endoform and total contact cast. 05/05/2022: The wound appears to have stalled. There is slough and some periwound eschar/callus. No concern for infection, however. 05/12/2022: Unfortunately, his right foot has reopened. It is located  at the most posterior aspect of his surgical incision. The area was noted to have drainage OWEN, PRATTE ([id number]) [id number] Page 5 of 16 coming from it when his padding was removed today. Underneath some callus and senescent skin, there is an opening. No purulent drainage or malodor. On the left foot, the wound is again unchanged. There is some light blue staining on the callus, but no malodor or purulent drainage. 10/13; right and left heel remanence of extensive plantar foot wounds. These are better than I remember by quite a big margin however he is still left with wounds on the left plantar heel and the right plantar heel. Been  using endoform bilaterally. A culture was done that showed apparently Pseudomonas but we are still waiting for the [facility] antibiotic to use gentamicin today. He is still very active by description I am not sure about the offloading of his noncasted right foot 10/20; both wounds right and left heel debrided not much change from last week. [name] Echevaria has arrived which is linezolid, gentamicin and ciprofloxacin we will use this with endoform. T contact cast on the left otal 06/02/2022: Both wounds are smaller today. There is still a fair amount of callus buildup around the right foot ulcer. The left is more superficial and nearly flush with the surrounding tissues. Also with slough and eschar buildup. 06/10/2022: The right sided wound appears to be nearly closed, if not completely so, although it is somewhat difficult to tell given the abnormal tissue and scarring in his foot. There is a fair amount of callus and crust accumulation. On the left, the wound looks about the same, again with callus and slough. He has an appointment next Thursday with Dr. Annamary Rummage in podiatry; I am hopeful that there may be some reconstructive options available for Mr. Lewter. 06/16/2022: Both wounds have some eschar and callus accumulation. The right sided wound is extremely narrow and barely open; the left is narrower than last week. There is a little bit of slough. He has his appointment in podiatry later today. 06/23/2022: The patient met with Dr. Annamary Rummage last week and unfortunately, there are no reconstructive options that he believes would be helpful. He did order an MRI to evaluate for osteomyelitis and fortunately, none was seen. The left sided wound is a little bit shorter and narrower today. The right sided wound is about the same. There is callus and eschar accumulation bilaterally. 06/29/2022: Both feet have improved from last week. There is epithelium making a valiant effort to creep across the surface on  the left. The right side looks like it got a little dry and the deep crevasse in his midfoot has cracked. Both have eschar and there is some slough on the left. 07/07/2022: Both feet have improved. There is epithelium completely covering the calcaneus on the right with just a small opening in the crevasse in his midfoot. On the left, the open area of tissue is smaller but he continues to build up callus/eschar and slough. 07/15/2022: The opening in the midfoot on the right is about the same size, covered with eschar and a little bit of slough. The open portion of the left wound is narrower and shorter with a bit of slough buildup. He admits to being on his feet more than recommended. 12/14; as far as I can tell everything on the right foot is closed. There is some eschar I removed some of this I cannot identify any open wound here. As usual this will be a very  vulnerable area going forward. On the left this looks really quite healthy. I was pleasantly surprised to see how good this looked. Wound is certainly smaller and there appears to be healthy epithelialization. He has been using Promogran on the right and endoform on the left. He has been offloading the right foot with a heel offloading boot and he has a running shoe on the right foot 08/04/2022: The right foot remains closed. He has a thick cushioned insole in his sneaker. The left sided wound is smaller with just some slough and eschar accumulation. He is wearing the heel off loader on this foot. 08/15/2022: The right foot remains closed. The left wound has narrowed further. There is some slough and eschar accumulation. 08/25/2022: We put him in a peg assist shoe insert and as a result, he has more epithelialization of the ulcer with minimal slough and eschar accumulation. 09/01/2022: The wound is smaller by about half this week. Still with some slough on the surface. The peg assist shoe seems to be doing a remarkable job of adequately offloading  the site. 09/08/2022: There is a little bit more epithelium coming in. There is some slough and callus buildup. 09/16/2022: The wound measures about the same size, but the epithelium that has grown in looks more robust and stronger. There is some slough and callus buildup. 09/23/2022: The wound remains about the same size. The skin edges are looking rather senescent. 09/29/2022: The aggressive debridement I performed last week seems to have been effective. The wound is smaller and has significantly less slough accumulation. 10/06/2022: There has been quite a bit of epithelialization since last week. There are still some open areas with slough accumulation. There is some callus buildup around the perimeter. 10/13/2022: Continued improvement. Minimal callus accumulation. 3/14; patient presents for follow-up. He has been using endoform to the wound bed without issues. He is using a surgical shoe with peg assist for offloading. 10/27/2022: The wound dimensions are stable. There is some senescent skin accumulation around the perimeter. 11/04/2022: Last week I performed a very aggressive debridement in an effort to stimulate the healing cascade. As has been the case when I have done this before, the wound has responded well. There has been epithelialization and contraction of the wound. There are just a couple of small open areas. 11/10/2022: Unfortunately, his foot got wet secondary to sweating and there has been some breakdown of the tissue, particularly the skin just adjacent to the main ulcer. 11/18/2022: We continue to struggle with moisture-related tissue breakdown around the perimeter of his wound. This has caused the thin epithelium that had formed on the surface to disintegrate. The underlying surface of the wound has good granulation tissue. 11/25/2022: The edges of the wound are much less macerated, but the surface is actually looking a little bit dry. There is slough accumulation. No obvious signs of  infection. Electronic Signature(s) Signed: 11/25/2022 12:20:32 PM By: [name] MD FACS Entered By: [name] on 11/25/2022 12:20:32 [name] ([id number]) [id number] Page 6 of 16 -------------------------------------------------------------------------------- Physical Exam Details Patient Name: Date of Service: [name], TO [name] 11/25/2022 11:00 A M Medical Record Number: [id number] Patient Account Number: [id number] Date of Birth/Sex: Treating RN: [birth year] (47 y.o. M) Primary Care Provider: Dorinda Hill Other Clinician: Referring Provider: Treating Provider/Extender: Jana Hakim in Treatment: 123 Constitutional . . . . no acute distress. Respiratory Normal work of breathing on room air. Notes 11/25/2022: The edges of the wound are much less macerated,  but the surface is actually looking a little bit dry. There is slough accumulation. No obvious signs of infection. Electronic Signature(s) Signed: 11/25/2022 12:24:44 PM By: [name] MD FACS Entered By: [name] on 11/25/2022 12:24:44 -------------------------------------------------------------------------------- Physician Orders Details Patient Name: Date of Service: [name], [name] 11/25/2022 11:00 A M Medical Record Number: [id number] Patient Account Number: [id number] Date of Birth/Sex: Treating RN: 1976-05-18 (47 y.o. [name] Primary Care Provider: Dorinda Hill Other Clinician: Referring Provider: Treating Provider/Extender: Jana Hakim in Treatment: 123 Verbal / Phone Orders: No Diagnosis Coding ICD-10 Coding Code Description [phone] Non-pressure chronic ulcer of other part of left foot with fat layer exposed Follow-up Appointments ppointment in 1 week. - Dr. Lady Gary Room 3 Return A Anesthetic Wound #2 Left Calcaneus (In clinic) Topical Lidocaine 4% applied to wound bed - In clinic Bathing/ Shower/  Hygiene May shower and wash wound with soap and water. Edema Control - Lymphedema / SCD / Other Bilateral Lower Extremities Avoid standing for long periods of time. Moisturize legs daily. - as needed Off-Loading Other: - keep pressure off of the bottom of your feet. Elevate legs throughout the day. Use the Shoe with the PegAssist off-loading insole Additional Orders / Instructions Follow Nutritious Diet - Try to get 70-100 grams of Protein a day+ Wound Treatment Wound #2 - Calcaneus Wound Laterality: Left Cleanser: Normal Saline (Generic) Every Other Day/30 Days Discharge Instructions: Cleanse the wound with Normal Saline prior to applying a clean dressing using gauze sponges, not tissue or cotton balls. Cleanser: Wound Cleanser (Generic) Every Other Day/30 Days Discharge Instructions: Cleanse the wound with wound cleanser prior to applying a clean dressing using gauze sponges, not tissue or cotton balls. Peri-Wound Care: Zinc Oxide Ointment 30g tube Every Other Day/30 Days REGGIE, BISE ([id number]) [id number] Page 7 of 16 Discharge Instructions: Apply Zinc Oxide to periwound with each dressing change Topical: Gentamicin Every Other Day/30 Days Discharge Instructions: On hold As directed by physician Topical: compounding topical antibiotics from [facility] Every Other Day/30 Days Discharge Instructions: on hold 4/08/16/22 apply directly to wound bed. [name] Echevaria) Prim Dressing: Cutimed Sorbact Swab Every Other Day/30 Days ary Discharge Instructions: Apply to wound bed as instructed Secondary Dressing: Optifoam Non-Adhesive Dressing, 4x4 in (Generic) Every Other Day/30 Days Discharge Instructions: Apply over primary dressing as directed. Secondary Dressing: Woven Gauze Sponge, Non-Sterile 4x4 in (Generic) Every Other Day/30 Days Discharge Instructions: Apply over primary dressing as directed. Secured With: 29M Medipore H Soft Cloth Surgical T ape, 4 x  10 (in/yd) (Generic) Every Other Day/30 Days Discharge Instructions: Secure with tape as directed. Compression Wrap: Kerlix Roll 4.5x3.1 (in/yd) Every Other Day/30 Days Discharge Instructions: Apply Kerlix and Coban compression as directed. Electronic Signature(s) Signed: 11/25/2022 12:37:49 PM By: [name] MD FACS Entered By: [name] on 11/25/2022 12:25:03 -------------------------------------------------------------------------------- Problem List Details Patient Name: Date of Service: [name], [name] 11/25/2022 11:00 A M Medical Record Number: [id number] Patient Account Number: [id number] Date of Birth/Sex: Treating RN: 06-17-1976 (47 y.o. M) Primary Care Provider: Dorinda Hill Other Clinician: Referring Provider: Treating Provider/Extender: Jana Hakim in Treatment: 123 Active Problems ICD-10 Encounter Code Description Active Date MDM Diagnosis L97.522 Non-pressure chronic ulcer of other part of left foot with fat layer exposed 09/03/2020 No Yes Inactive Problems ICD-10 Code Description Active Date Inactive Date L97.512 Non-pressure chronic ulcer of other part of right foot with fat layer exposed 09/03/2020 09/03/2020 L89.893 Pressure ulcer of other site, stage 3  07/15/2020 07/15/2020 M62.81 Muscle weakness (generalized) 07/15/2020 07/15/2020 I10 Essential (primary) hypertension 07/15/2020 07/15/2020 M86.171 Other acute osteomyelitis, right ankle and foot 09/03/2020 09/03/2020 Resolved Problems BLESS, BELSHE ([id number]) [id number] Page 8 of 16 Electronic Signature(s) Signed: 11/25/2022 12:18:06 PM By: [name] MD FACS Entered By: [name] on 11/25/2022 12:18:06 -------------------------------------------------------------------------------- Progress Note Details Patient Name: Date of Service: [name], [name] 11/25/2022 11:00 A M Medical Record Number: [id number] Patient Account Number:  [id number] Date of Birth/Sex: Treating RN: [birth year] (47 y.o. M) Primary Care Provider: Dorinda Hill Other Clinician: Referring Provider: Treating Provider/Extender: Jana Hakim in Treatment: 123 Subjective Chief Complaint Information obtained from Patient Bilateral Plantar Foot Ulcers History of Present Illness (HPI) Wounds are12/03/2020 upon evaluation today patient presents for initial inspection here in our clinic concerning issues he has been having with the bottoms of his feet bilaterally. He states these actually occurred as wounds when he was hospitalized for 5 months secondary to Covid. He was apparently with tilting bed where he was in an upright position quite frequently and apparently this occurred in some way shape or form during that time. Fortunately there is no sign of active infection at this time. No fevers, chills, nausea, vomiting, or diarrhea. With that being said he still has substantial wounds on the plantar aspects of his feet Theragen require quite a bit of work to get these to heal. He has been using Santyl currently though that is been problematic both in receiving the medication as well as actually paid for it as it is become quite expensive. Prior to the experience with Covid the patient really did not have any major medical problems other than hypertension he does have some mild generalized weakness following the Covid experience. 07/22/2020 on evaluation today patient appears to be doing okay in regard to his foot ulcers I feel like the wound beds are showing signs of better improvement that I do believe the Iodoflex is helping in this regard. With that being said he does have a lot of drainage currently and this is somewhat blue/green in nature which is consistent with Pseudomonas. I do think a culture today would be appropriate for [place] to evaluate and see if that is indeed the case I would likely start him on antibiotic orally as well he is  not allergic to Cipro knows of no issues he has had in the past 12/21; patient was admitted to the clinic earlier this month with bilateral presumed pressure ulcers on the bottom of his feet apparently related to excessive pressure from a tilt table arrangement in the intensive care unit. Patient relates this to being on ECMO but I am not really sure that is exactly related to that. I must say I have never seen anything like this. He has fairly extensive full-thickness wounds extending from his heel towards his midfoot mostly centered laterally. There is already been some healing distally. He does not appear to have an arterial issue. He has been using gentamicin to the wound surfaces with Iodoflex to help with ongoing debridement 1/6; this is a patient with pressure ulcers on the bottom of his feet related to excessive pressure from a standing position in the intensive care unit. He is complaining of a lot of pain in the right heel. He is not a diabetic. 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 We have been using Iodoflex to help prepare the surfaces of both wounds for an advanced treatment product. He is nonambulatory  spending most of his time in a wheelchair I have asked him not to propel the wheelchair with his heels 1/13; in general his wounds look better not much surface area change we have been using Iodoflex as of last week. I did an x-ray of the right heel as the patient was complaining of pain. I had some thoughts about a stress fracture perhaps Achilles tendon problems however what it showed was erosive changes along the inferior aspect of the calcaneus he now has a MRI booked for 1/20. 1/20; in general his wounds continue to be better. Some improvement in the large narrow areas proximally in his foot. He is still complaining of pain in the right heel and tenderness in certain areas of this wound. His MRI is tonight. I am not just looking for osteomyelitis that was  brought up on the x-ray I am wondering about stress fractures, tendon ruptures etc. He has no such findings on the left. Also noteworthy is that the patient had critical illness neuropathy and some of the discomfort may be actual improvement in nerve function I am just not sure. These wounds were initially in the setting of severe critical illness related to COVID-19. He was put in a standing position. He may have also been on pressors at the point contributing to tissue ischemia. By his description at some point these wounds were grossly necrotic extending proximally up into the Achilles part of his heel. I do not know that I have ever really seen pictures of them like this although they may exist in epic We have ordered Tri layer Oasis. I am trying to stimulate some granulation in these areas. This is of course assuming the MRI is negative for infection 1/27; since the patient was last here he saw Dr. Earlene Plater of infectious disease. He is planned for vancomycin and ceftriaxone. Prior operative culture grew MSSA. Also ordered baseline lab work. He also ordered arterial studies although the ABIs in our clinic were normal as well as his clinical exam these were normal I do not think he needs to see vascular surgery. His ABIs at the [facility] were 1.22 in the right triphasic waveforms with a normal TBI of 1.15 on the left ABI of 1.22 with triphasic waveforms and a normal TBI of 1.08. Finally he saw Dr. Logan Bores who will follow him in for 2 months. At this point I do not think he felt that he needed a procedure on the right calcaneal bone. Dr. Earlene Plater is elected for broad-spectrum antibiotic The patient is still having pain in the right heel. He walks with a walker 2/3; wounds are generally smaller. He is tolerating his IV antibiotics. I believe this is vancomycin and ceftriaxone. We are still waiting for Oasis burn in terms of his out-of-pocket max which he should be meeting soon given the IV antibiotics, MRIs  etc. I have asked him to check in on this. We are using silver collagen in the meantime the wounds look better 2/10; tolerating IV vancomycin and Rocephin. We are waiting to apply for Oasis. Although I am not really sure where he is in his out-of-pocket max. 2/17 started the first application of Oasis trilayer. Still on antibiotics. The wounds have generally look better. The area on the left has a little more surface slough requiring debridement 2/24; second application of Oasis trilayer. The wound surface granulation is generally look better. The area on the left with undermining laterally I think is come in a bit. 10/08/2020 upon evaluation today patient  is here today for [employer] application #3. Fortunately he seems to be doing extremely well with regard to this and we are seeing a lot of new epithelial growth which is great news. Fortunately there is no signs of active infection at this time. 10/16/2020 upon evaluation today patient appears to be doing well with regard to his foot ulcers. Do believe the Oasis has been of benefit for him. I do not see any signs of infection right now which is great news and I think that he has a lot of new epithelial growth which is great to see as well. The patient is very ZYAN, COBY ([id number]) [id number] Page 9 of 16 pleased to hear all of this. I do think we can proceed with the Oasis trilayer #4 today. 3/18; not as much improvement in these areas on his heels that I was hoping. I did reapply trilateral Oasis today the tissue looks healthier but not as much fill in as I was hoping. 3/25; better looking today I think this is come in a bit the tissue looks healthier. Triple layer Oasis reapplied #6 4/1; somewhat better looking definitely better looking surface not as much change in surface area as I was hoping. He may be spending more time [place] on days then he needs to although he does have heel offloading boots. Triple  layer Oasis reapplied #7 4/7; unfortunately apparently [facility] will not approve any further Oasis which is unfortunate since the patient did respond nicely both in terms of the condition of the wound bed as well as surface area. There is still some drainage coming from the wound but not a lot there does not appear to be any infection 4/15; we have been using Hydrofera Blue. He continues to have nice rims of epithelialization on the right greater than the left. The left the epithelialization is coming from the tip of his heel. There is moderate drainage. In this that concerns me about a total contact cast. There is no evidence of infection 4/29; patient has been using Hydrofera Blue with dressing changes. He has no complaints or issues today. 5/5; using Hydrofera Blue. I actually think that he looks marginally better than the last time I saw this 3 weeks ago. There are rims of epithelialization on the left thumb coming from the medial side on the right. Using Hydrofera Blue 5/12; using Hydrofera Blue. These continue to make improvements in surface area. His drainage was not listed as severe I therefore went ahead and put a cast on the left foot. Right foot we will continue to dress his previous 5/16; back for first total contact cast change. He did not tolerate this particularly well cast injury on the anterior tibia among other issues. Difficulty sleeping. I talked him about this in some detail and afterwards is elected to continue. I told him I would like to have a cast on for 3 weeks to see if this is going to help at all. I think he agreed 5/19; I think the wound is better. There is no tunneling towards his midfoot. The undermining medially also looks better. He has a rim of new skin distally. I think we are making progress here. The area on the left also continues to look somewhat better to me using Hydrofera Blue. He has a list of complaints about the cast but none of them seem  serious 5/26; patient presents for 1 week follow-up. He has been using a total contact cast and tolerating this  well. Hydrofera Blue is the main dressing used. He denies signs of infection. 6/2 Hydrofera Blue total contact cast on the left. These were large ulcers that formed in intensive care unit where the patient was recovering from COVID. May have had something to do with being ventilated in an upright positiono Pressors etc. We have been able to get the areas down considerably and a viable surface. There is some epithelialization in both sides. Note made of drainage 6/9; changed to [facility] last time because of drainage. He arrives with better looking surfaces and dimensions on the left than the right. Paradoxically the right actually probes more towards his midfoot the left is largely close down but both of these look improved. Using a total contact cast on the left 6/16; complex wounds on his bilateral plantar heels which were initially pressure injury from a stay in the ICU with COVID. We have been using silver alginate most recently. His dimensions of come in quite dramatically however not recently. We have been putting the left foot in a total contact cast 6/23; complex wounds on the bilateral plantar heels. I been putting the left in the cast paradoxically the area on the right is the one that is going towards closure at a faster rate. Quite a bit of drainage on the left. The patient went to see Dr. Logan Bores who said he was going to standby for skin grafts. I had actually considered sending him for skin grafts however he would be mandatorily off his feet for a period of weeks to months. I am thinking that the area on the right is going to close on its own the area on the left has been more stubborn even though we have him in a total contact cast 6/30; took him out of a total contact cast last week is the right heel seem to be making better progress than the left where I was placing the  cast. We are using silver alginate. Both wounds are smaller right greater than left 7/12; both wounds look as though they are making some progress. We are using silver alginate. Heel offloading boots 7/26; very gradual progress especially on the right. Using silver alginate. He is wearing heel offloading boots 8/18; he continues to close these wounds down very gradually. Using silver alginate. The problem polymen being definitive about this is areas of what appears to be callus around the margins. This is not a 100% of the area but certainly sizable especially on the right 9/1; bilateral plantar feet wounds secondary to prolonged pressure while being ventilated for COVID-19 in an upright position. Essentially pressure ulcers on the bottom of his feet. He is made substantial progress using silver alginate. 9/14; bilateral plantar feet wounds secondary to prolonged pressure. Making progress using silver alginate. 9/29 bilateral plantar feet wounds secondary to prolonged pressure. I changed him to Iodoflex last week. MolecuLight showing reddened blush fluorescence 10/11; patient presents for follow-up. He has no issues or complaints today. He denies signs of infection. He continues to use Iodoflex and antibiotic ointment to the wound beds. 10/27; 2-week follow-up. No evidence of infection. He has callus and thick dry skin around the wound margins we have been using Iodoflex and Bactroban which was in response to a moderate left MolecuLight reddish blush fluorescence. 11/10; 2-week follow-up. Wound margins again have thick callus however the measurements of the actual wound sites are a lot smaller. Everything looks reasonably healthy here. We have been using Iodoflex He was approved for prime matrix  but I have elected to delay this given the improvement in the surface area. Hopefully I will not regret that decision as were getting close to the end of the year in terms of insurance payment 12/8; 2-week  follow-up. Wounds are generally smaller in size. These were initially substantial wounds extending into the forefoot all the way into the heel on the bilateral plantar feet. They are now both located on the plantar heel distal aspect both of these have a lot of callus around the wounds I used a #5 curette to remove this on the right and the left also some subcutaneous debris to try and get the wound edges were using Iodoflex. He has heel offloading shoe 12/22; 2-week follow-up. Not really much improvement. He has thick callus around the outer edges of both wounds. I remove this there is some nonviable subcutaneous tissue as well. We have been using Iodoflex. Her intake nurse and myself spontaneously thought of a total contact cast I went back in May. At that time we really were not seeing much of an improvement with a cast although the wound was in a much different situation I would like to retry this in 2 weeks and I discussed this with the patient 08/12/2021; the patient has had some improvement with the Iodoflex. The the area on the left heel plantar more improved than the right. I had to put him in a total contact cast on the left although I decided to put that off for 2 weeks. I am going to change his primary dressing to silver collagen. I think in both areas he has had some improvement most of the healing seems to be more proximal in the heel. The wounds are in the mid aspect. A lot of thick callus on the right heel however. 1/19; we are using silver collagen on both plantar heel areas. He has had some improvement today. The left did not require any debridement. He still had some eschar on the right that was debrided but both seem to have contracted. I did not put it total contact cast on him today 2/2 we have been using silver collagen. The area on the right plantar heel has areas that appear to be epithelialized interspersed with dry flaking callus and dry skin. I removed this. This really looks  better than on the other side. On the left still a large area with raised edges and debris on the surface. 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 He has no issues or complaints today. 2/9; not much change in the left foot wound with 1 week of a cast we are using silver collagen. Silver collagen on the right side. The right side has been the better wound surface. We will reapply the total contact cast on the left 2/16; not much improvement on either side I been using silver collagen with a total contact cast on the left. I'm changing the Hydrofera Blue still with a total contact cast on the left 2/23; some improvement on both sides. Disappointing that he has thick callus around the area that we are putting in a total contact cast on the left. We've been using Hydrofera Blue on both wound areas. This is a man who at essentially pressure ulcers in addition to ischemia caused by medications to support his blood pressure (pressors) in the ICU. He was being ventilated in  the standing position for severe Covid. A Shiley the wounds extended across his entire foot but are now localized to his plantar heels bilaterally. We have made progress however neither areas healed. I continue to think the total contact cast is helped albeit painstakingly slowly. He has never wanted a plastic surgery consult although I don't know that they would be interested in grafting in area in this location. 10/07/2021: Continued improvement bilaterally. There is still some callus around the left wound, despite the total contact cast. He has some increased pain in his AUSTIN, HERD ([id number]) [id number] Page 10 of 16 right midfoot around 1 particular area. This has been painful in the past but seems to be a little bit worse. When his cast was removed today, there was an  area on the heel of the left foot that looks a bit macerated. He is also complaining of pain in his left thigh and hip which he thinks is secondary to the limb length discrepancy caused by the cast. 10/14/2021: He continues to improve. A little bit less callus around the left wound. He continues to endorse pain in his right midfoot, but this is not as significant as it was last week. The maceration on his left heel is improved. 10/21/2021: Continued improvement to both wounds. The maceration on his left heel is no longer evident. Less callus bilaterally. Epithelialization progressing. 10/28/2021: Significant improvement this week. The right sided wound is nearly closed with just a small open area at the middle. No maceration seen on the left heel. Continued epithelialization on both sides. No concern for infection. 11/04/2021: T oday, the wounds were measured a little bit differently and come out as larger, but I actually think they are about the same to potentially even smaller, particularly on the left. He continues to accumulate some callus on the right. 11/11/2021: T oday, the patient is expressing some concern that the left wound, despite being in the total contact cast, is not progressing at the same rate as the 1 on the right. He is interested in trying a week without the cast to see how the wound does. The wounds are roughly the same size as last week, with the right perhaps being a little bit smaller. He continues to build up callus on both sites. 11/18/2021: Last week, I permitted the patient to go without his total contact cast, just to see if the cast was really making any difference. Today, both wounds have deteriorated to some extent, suggesting that the cast is providing benefit, at least on the left. Both are larger and have accumulated callus, slough, and other debris. 11/26/2021: I debrided both wounds quite aggressively last week in an effort to stimulate the healing cascade. This appears  to have been effective as the left sided wound is a full centimeter shorter in length. Although the right was measured slightly larger, on inspection, it looks as though an area of epithelialized tissue was included in the measurements. We have been using PolyMem Ag on the wound surfaces with a total contact cast to the left. 12/02/2021: It appears that the intake personnel are including epithelialized tissue in his wound measurements; the right wound is almost completely epithelialized; there is just a crater at the proximal midfoot with some open areas. On the left, he has built up some callus, but the overall wound surface looks good. There is some senescent skin around the wound margin. He has been in PolyMem Ag bilaterally with a total contact cast on the left.  12/09/2021: The right wound is nearly closed; there is just a small open area at the mid calcaneus. On the left, the wound is smaller with minimal callus buildup. No significant drainage. 12/16/2021: The right calcaneal wound remains minimally open at the mid calcaneus; the rest has epithelialized. On the left, the wound is also a little bit smaller. There is some senescent tissue on the periphery. He is getting his first application of a trial skin substitute called Vendaje today. 12/23/2021: The wound on his right calcaneus is nearly closed; there is just a small area at the most distal aspect of the calcaneus that is open. On the left, the area where we applied to the skin substitute has a healthier-looking bed of granulation tissue. The wound dimensions are not significantly different on this side but the wound surface is improved. 12/30/2021: The wound on the right calcaneus has not changed significantly aside from some accumulation of callus. On the left, the open area is smaller and continues to have an improved surface. He continues to accumulate callus around the wound. He is here for his third application of Vendaje. 01/06/2022: The  right calcaneal wound is down to just a couple of millimeters. He continues to accumulate periwound callus. He unfortunately got his cast wet earlier in the week and his left foot is macerated, resulting in some superficial skin loss just distal to the open wound. The open wound itself, however, is much smaller and has a healthier appearing surface. He is here for his fourth application of Vendaje. 01/13/2022: The right calcaneal wound is about the same. Unfortunately, once again, his cast got wet and his foot is again macerated. This is caused the left calcaneal wound to enlarge. He is here for his fifth application of Vendaje. 01/20/2022: The right calcaneal wound is very small. There is some periwound callus accumulation. He purchased a new cast protector last week and this has been effective in avoiding the maceration that has been occurring on the left. The left calcaneal wound is narrower and has a healthy and viable-appearing surface. He is here for his 6 application of Vendaje. 01/27/2022: The right calcaneal wound is down to just a pinhole. There is some periwound slough and callus. On the left, the wound is narrower and shorter by about a centimeter. The surface is robust and viable-appearing. Unfortunately, the rep for the trial skin substitute product did not provide any for [place] to use today. 02/04/2022: The right calcaneal wound remains unchanged. There is more accumulated callus. On the left, although the intake nurse measured it a little bit longer, it looks about the same to me. The surface has a layer of slough, but underneath this, there is good granulation tissue. 02/10/2022: The right calcaneus wound is nearly closed. There is still some callus that builds up around the site. The left side looks about the same in terms of dimensions, but the surface is more robust and vital-appearing. 02/16/2022: The area of the right calcaneus that was nearly closed last week has closed, but there is a  small opening at the mid foot where it looks like some moisture got retained and caused some reopening. The left foot wound is narrower and shallower. Both sites have a fair amount of periwound callus and eschar. 02/24/2022: The small midfoot opening on the right calcaneus is a little bit smaller today. The left foot wound is narrower and shallower. He continues to accumulate periwound callus. No concern for infection. 03/01/2022: The patient came to clinic early  because he showered and got his cast wet. Fortunately, there is no significant maceration to his foot but the callus softened and it looks like the wound on his left calcaneus may be a little bit wider. The wound on his right calcaneus is just a narrow slit. Continued accumulation of periwound callus bilaterally. 03/08/2022: The wound on his right calcaneus is very nearly closed, just a small pinpoint opening under a bit of eschar; the left wound has come in quite a bit, as well. It is narrower and shorter than at our last visit. Still with accumulated callus and eschar bilaterally. 03/17/2022: The right calcaneal wound is healed. The left wound is smaller and the surface itself is very clean, but there is some blue-green staining on the periwound callus, concerning for Pseudomonas aeruginosa. 03/23/2022: The right calcaneal wound remains closed. The left wound continues to contract. No further blue-green staining. Small amount of callus and slough accumulation. 03/28/2022: He came in early today because he had gotten his cast wet. On inspection, the wound itself did not get wet or macerated, just a little bit of the forefoot. The wound itself is basically unchanged. 04/07/2022: The right foot wound remains closed. The left wound is the smallest that I have seen it to date. It is narrower and shorter. It still continues to accumulate slough on the surface. 04/15/2022: There is a band of epithelium now dividing the small left plantar foot wound in 2.  There is still some slough on the surface. NAKOA, GANUS ([id number]) [id number] Page 11 of 16 04/21/2022: The wound continues to narrow. Just a little bit of slough on the surface. He seems to be responding well to endoform. 04/28/2022: Continued slow contraction of the wound. There is a little slough on the surface and some periwound callus. We have been using endoform and total contact cast. 05/05/2022: The wound appears to have stalled. There is slough and some periwound eschar/callus. No concern for infection, however. 05/12/2022: Unfortunately, his right foot has reopened. It is located at the most posterior aspect of his surgical incision. The area was noted to have drainage coming from it when his padding was removed today. Underneath some callus and senescent skin, there is an opening. No purulent drainage or malodor. On the left foot, the wound is again unchanged. There is some light blue staining on the callus, but no malodor or purulent drainage. 10/13; right and left heel remanence of extensive plantar foot wounds. These are better than I remember by quite a big margin however he is still left with wounds on the left plantar heel and the right plantar heel. Been using endoform bilaterally. A culture was done that showed apparently Pseudomonas but we are still waiting for the [facility] antibiotic to use gentamicin today. He is still very active by description I am not sure about the offloading of his noncasted right foot 10/20; both wounds right and left heel debrided not much change from last week. [name] Echevaria has arrived which is linezolid, gentamicin and ciprofloxacin we will use this with endoform. T contact cast on the left otal 06/02/2022: Both wounds are smaller today. There is still a fair amount of callus buildup around the right foot ulcer. The left is more superficial and nearly flush with the surrounding tissues. Also with slough and eschar  buildup. 06/10/2022: The right sided wound appears to be nearly closed, if not completely so, although it is somewhat difficult to tell given the abnormal tissue and scarring in his foot. There is  a fair amount of callus and crust accumulation. On the left, the wound looks about the same, again with callus and slough. He has an appointment next Thursday with Dr. Annamary Rummage in podiatry; I am hopeful that there may be some reconstructive options available for Mr. Skog. 06/16/2022: Both wounds have some eschar and callus accumulation. The right sided wound is extremely narrow and barely open; the left is narrower than last week. There is a little bit of slough. He has his appointment in podiatry later today. 06/23/2022: The patient met with Dr. Annamary Rummage last week and unfortunately, there are no reconstructive options that he believes would be helpful. He did order an MRI to evaluate for osteomyelitis and fortunately, none was seen. The left sided wound is a little bit shorter and narrower today. The right sided wound is about the same. There is callus and eschar accumulation bilaterally. 06/29/2022: Both feet have improved from last week. There is epithelium making a valiant effort to creep across the surface on the left. The right side looks like it got a little dry and the deep crevasse in his midfoot has cracked. Both have eschar and there is some slough on the left. 07/07/2022: Both feet have improved. There is epithelium completely covering the calcaneus on the right with just a small opening in the crevasse in his midfoot. On the left, the open area of tissue is smaller but he continues to build up callus/eschar and slough. 07/15/2022: The opening in the midfoot on the right is about the same size, covered with eschar and a little bit of slough. The open portion of the left wound is narrower and shorter with a bit of slough buildup. He admits to being on his feet more than recommended. 12/14; as  far as I can tell everything on the right foot is closed. There is some eschar I removed some of this I cannot identify any open wound here. As usual this will be a very vulnerable area going forward. On the left this looks really quite healthy. I was pleasantly surprised to see how good this looked. Wound is certainly smaller and there appears to be healthy epithelialization. He has been using Promogran on the right and endoform on the left. He has been offloading the right foot with a heel offloading boot and he has a running shoe on the right foot 08/04/2022: The right foot remains closed. He has a thick cushioned insole in his sneaker. The left sided wound is smaller with just some slough and eschar accumulation. He is wearing the heel off loader on this foot. 08/15/2022: The right foot remains closed. The left wound has narrowed further. There is some slough and eschar accumulation. 08/25/2022: We put him in a peg assist shoe insert and as a result, he has more epithelialization of the ulcer with minimal slough and eschar accumulation. 09/01/2022: The wound is smaller by about half this week. Still with some slough on the surface. The peg assist shoe seems to be doing a remarkable job of adequately offloading the site. 09/08/2022: There is a little bit more epithelium coming in. There is some slough and callus buildup. 09/16/2022: The wound measures about the same size, but the epithelium that has grown in looks more robust and stronger. There is some slough and callus buildup. 09/23/2022: The wound remains about the same size. The skin edges are looking rather senescent. 09/29/2022: The aggressive debridement I performed last week seems to have been effective. The wound is smaller and has  significantly less slough accumulation. 10/06/2022: There has been quite a bit of epithelialization since last week. There are still some open areas with slough accumulation. There is some callus buildup around the  perimeter. 10/13/2022: Continued improvement. Minimal callus accumulation. 3/14; patient presents for follow-up. He has been using endoform to the wound bed without issues. He is using a surgical shoe with peg assist for offloading. 10/27/2022: The wound dimensions are stable. There is some senescent skin accumulation around the perimeter. 11/04/2022: Last week I performed a very aggressive debridement in an effort to stimulate the healing cascade. As has been the case when I have done this before, the wound has responded well. There has been epithelialization and contraction of the wound. There are just a couple of small open areas. 11/10/2022: Unfortunately, his foot got wet secondary to sweating and there has been some breakdown of the tissue, particularly the skin just adjacent to the main ulcer. 11/18/2022: We continue to struggle with moisture-related tissue breakdown around the perimeter of his wound. This has caused the thin epithelium that had formed on the surface to disintegrate. The underlying surface of the wound has good granulation tissue. 11/25/2022: The edges of the wound are much less macerated, but the surface is actually looking a little bit dry. There is slough accumulation. No obvious signs of infection. Patient History Information obtained from Patient. WILKIE, ZENON ([id number]) [id number] Page 12 of 16 Family History Cancer - Maternal Grandparents, Diabetes - Father,Paternal Grandparents, Heart Disease - Maternal Grandparents, Hypertension - Father,Paternal Grandparents, Lung Disease - Siblings, No family history of Hereditary Spherocytosis, Kidney Disease, Seizures, Stroke, Thyroid Problems, Tuberculosis. Social History Never smoker, Marital Status - Married, Alcohol Use - Never, Drug Use - No History, Caffeine Use - Daily - tea, soda. Medical History Eyes Denies history of Cataracts, Glaucoma, Optic Neuritis Ear/Nose/Mouth/Throat Denies  history of Chronic sinus problems/congestion, Middle ear problems Hematologic/Lymphatic Denies history of Anemia, Hemophilia, Human Immunodeficiency Virus, Lymphedema, Sickle Cell Disease Respiratory Patient has history of Asthma Denies history of Aspiration, Chronic Obstructive Pulmonary Disease (COPD), Pneumothorax, Sleep Apnea, Tuberculosis Cardiovascular Patient has history of Angina - with COVID, Hypertension Denies history of Arrhythmia, Congestive Heart Failure, Coronary Artery Disease, Deep Vein Thrombosis, Hypotension, Myocardial Infarction, Peripheral Arterial Disease, Peripheral Venous Disease, Phlebitis, Vasculitis Gastrointestinal Denies history of Cirrhosis , Colitis, Crohnoos, Hepatitis A, Hepatitis B, Hepatitis C Endocrine Denies history of Type I Diabetes, Type II Diabetes Genitourinary Denies history of End Stage Renal Disease Immunological Denies history of Lupus Erythematosus, Raynaudoos, Scleroderma Integumentary (Skin) Denies history of History of Burn Musculoskeletal Denies history of Gout, Rheumatoid Arthritis, Osteoarthritis, Osteomyelitis Neurologic Denies history of Dementia, Neuropathy, Quadriplegia, Paraplegia, Seizure Disorder Oncologic Denies history of Received Chemotherapy, Received Radiation Psychiatric Denies history of Anorexia/bulimia, Confinement Anxiety Hospitalization/Surgery History - COVID PNA 07/22/2019- 11/14/2019. - 03/27/2020 wound debridement/ skin graft. Medical A Surgical History Notes nd Constitutional Symptoms (General Health) COVID PNA 07/22/2019-11/14/2019 [facility] ECMO, foot drop left foot , Genitourinary kidney stone Psychiatric anxiety Objective Constitutional no acute distress. Vitals Time Taken: 10:44 AM, Height: 69 in, Weight: 280 lbs, BMI: 41.3, Temperature: 98.3 F, Pulse: 90 bpm, Respiratory Rate: 18 breaths/min, Blood Pressure: 124/77 mmHg. Respiratory Normal work of breathing on room air. General Notes:  11/25/2022: The edges of the wound are much less macerated, but the surface is actually looking a little bit dry. There is slough accumulation. No obvious signs of infection. Integumentary (Hair, Skin) Wound #2 status is Open. Original cause of wound was Pressure Injury. The date acquired  was: 10/07/2019. The wound has been in treatment 123 weeks. The wound is located on the Left Calcaneus. The wound measures 4cm length x 1.1cm width x 0.1cm depth; 3.456cm^2 area and 0.346cm^3 volume. There is Fat Layer (Subcutaneous Tissue) exposed. There is no tunneling or undermining noted. There is a medium amount of serosanguineous drainage noted. The wound margin is thickened. There is medium (34-66%) red, pink granulation within the wound bed. There is a medium (34-66%) amount of necrotic tissue within the wound bed including Eschar and Adherent Slough. The periwound skin appearance had no abnormalities noted for color. The periwound skin appearance exhibited: Callus, Scarring, Maceration. The periwound skin appearance did not exhibit: Dry/Scaly. Periwound temperature was noted as No Abnormality. Assessment ALONSO, GAPINSKI ([id number]) [id number] Page 13 of 16 Active Problems ICD-10 Non-pressure chronic ulcer of other part of left foot with fat layer exposed Procedures Wound #2 Pre-procedure diagnosis of Wound #2 is a Pressure Ulcer located on the Left Calcaneus . There was a Selective/Open Wound Non-Viable Tissue Debridement with a total area of 4.4 sq cm performed by [name], MD. With the following instrument(s): Curette to remove Non-Viable tissue/material. Material removed includes [facility] after achieving pain control using Lidocaine 5% topical ointment. No specimens were taken. A time out was conducted at 11:20, prior to the start of the procedure. A Minimum amount of bleeding was controlled with Pressure. The procedure was tolerated well with a pain level of 0  throughout and a pain level of 0 following the procedure. Post Debridement Measurements: 4cm length x 1.1cm width x 0.1cm depth; 0.346cm^3 volume. Post debridement Stage noted as Category/Stage III. Character of Wound/Ulcer Post Debridement is improved. Post procedure Diagnosis Wound #2: Same as Pre-Procedure General Notes: Scribed for Dr. Lady Gary by J.Scotton. Plan Follow-up Appointments: Return Appointment in 1 week. - Dr. Lady Gary Room 3 Anesthetic: Wound #2 Left Calcaneus: (In clinic) Topical Lidocaine 4% applied to wound bed - In clinic Bathing/ Shower/ Hygiene: May shower and wash wound with soap and water. Edema Control - Lymphedema / SCD / Other: Avoid standing for long periods of time. Moisturize legs daily. - as needed Off-Loading: Other: - keep pressure off of the bottom of your feet. Elevate legs throughout the day. Use the Shoe with the PegAssist off-loading insole Additional Orders / Instructions: Follow Nutritious Diet - Try to get 70-100 grams of Protein a day+ WOUND #2: - Calcaneus Wound Laterality: Left Cleanser: Normal Saline (Generic) Every Other Day/30 Days Discharge Instructions: Cleanse the wound with Normal Saline prior to applying a clean dressing using gauze sponges, not tissue or cotton balls. Cleanser: Wound Cleanser (Generic) Every Other Day/30 Days Discharge Instructions: Cleanse the wound with wound cleanser prior to applying a clean dressing using gauze sponges, not tissue or cotton balls. Peri-Wound Care: Zinc Oxide Ointment 30g tube Every Other Day/30 Days Discharge Instructions: Apply Zinc Oxide to periwound with each dressing change Topical: Gentamicin Every Other Day/30 Days Discharge Instructions: On hold As directed by physician Topical: compounding topical antibiotics from [name] pharmacy Every Other Day/30 Days Discharge Instructions: on hold 4/08/16/22 apply directly to wound bed. [name] Echevaria) Prim Dressing: Cutimed Sorbact Swab Every Other Day/30  Days ary Discharge Instructions: Apply to wound bed as instructed Secondary Dressing: Optifoam Non-Adhesive Dressing, 4x4 in (Generic) Every Other Day/30 Days Discharge Instructions: Apply over primary dressing as directed. Secondary Dressing: Woven Gauze Sponge, Non-Sterile 4x4 in (Generic) Every Other Day/30 Days Discharge Instructions: Apply over primary dressing as directed. Secured With: 75M Medipore H Scientist, research (life sciences)  Surgical T ape, 4 x 10 (in/yd) (Generic) Every Other Day/30 Days Discharge Instructions: Secure with tape as directed. Com pression Wrap: Kerlix Roll 4.5x3.1 (in/yd) Every Other Day/30 Days Discharge Instructions: Apply Kerlix and Coban compression as directed. 11/25/2022: The edges of the wound are much less macerated, but the surface is actually looking a little bit dry. There is slough accumulation. No obvious signs of infection. I used a curette to debride slough from the wound surface. I am going to hold all topical antibiotics as I think we may be getting some desiccation of the wound surface with this. I am going to change the contact layer to Sorbact. Unfortunately the maceration from last week caused breakdown of the thin epithelium that had formed so we have had a bit of a setback. Hopefully we can recover the healing and go forward from this point. Electronic Signature(s) Signed: 11/25/2022 12:26:10 PM By: [name] MD FACS Entered By: [name] on 11/25/2022 12:26:09 [name] ([id number]) [id number] Page 14 of 16 -------------------------------------------------------------------------------- HxROS Details Patient Name: Date of Service: [name], TO [name] 11/25/2022 11:00 A M Medical Record Number: [id number] Patient Account Number: [id number] Date of Birth/Sex: Treating RN: 13-Dec-1975 (47 y.o. M) Primary Care Provider: Dorinda Hill Other Clinician: Referring Provider: Treating Provider/Extender: Jana Hakim in Treatment: 123 Information Obtained From Patient Constitutional Symptoms (General Health) Medical History: Past Medical History Notes: COVID PNA 07/22/2019-11/14/2019 V[facility] ECMO, foot drop left foot , Eyes Medical History: Negative for: Cataracts; Glaucoma; Optic Neuritis Ear/Nose/Mouth/Throat Medical History: Negative for: Chronic sinus problems/congestion; Middle ear problems Hematologic/Lymphatic Medical History: Negative for: Anemia; Hemophilia; Human Immunodeficiency Virus; Lymphedema; Sickle Cell Disease Respiratory Medical History: Positive for: Asthma Negative for: Aspiration; Chronic Obstructive Pulmonary Disease (COPD); Pneumothorax; Sleep Apnea; Tuberculosis Cardiovascular Medical History: Positive for: Angina - with COVID; Hypertension Negative for: Arrhythmia; Congestive Heart Failure; Coronary Artery Disease; Deep Vein Thrombosis; Hypotension; Myocardial Infarction; Peripheral Arterial Disease; Peripheral Venous Disease; Phlebitis; Vasculitis Gastrointestinal Medical History: Negative for: Cirrhosis ; Colitis; Crohns; Hepatitis A; Hepatitis B; Hepatitis C Endocrine Medical History: Negative for: Type I Diabetes; Type II Diabetes Genitourinary Medical History: Negative for: End Stage Renal Disease Past Medical History Notes: kidney stone Immunological Medical History: Negative for: Lupus Erythematosus; Raynauds; Scleroderma Integumentary (Skin) Medical History: Negative for: History of Burn Musculoskeletal Medical History: Negative for: Gout; Rheumatoid Arthritis; Osteoarthritis; Osteomyelitis MIKHAEL, HENDRIKS ([id number]) [id number] Page 15 of 16 Neurologic Medical History: Negative for: Dementia; Neuropathy; Quadriplegia; Paraplegia; Seizure Disorder Oncologic Medical History: Negative for: Received Chemotherapy; Received Radiation Psychiatric Medical History: Negative for: Anorexia/bulimia; Confinement  Anxiety Past Medical History Notes: anxiety Immunizations Pneumococcal Vaccine: Received Pneumococcal Vaccination: No Implantable Devices None Hospitalization / Surgery History Type of Hospitalization/Surgery COVID PNA 07/22/2019- 11/14/2019 03/27/2020 wound debridement/ skin graft Family and Social History Cancer: Yes - Maternal Grandparents; Diabetes: Yes - Father,Paternal Grandparents; Heart Disease: Yes - Maternal Grandparents; Hereditary Spherocytosis: No; Hypertension: Yes - Father,Paternal Grandparents; Kidney Disease: No; Lung Disease: Yes - Siblings; Seizures: No; Stroke: No; Thyroid Problems: No; Tuberculosis: No; Never smoker; Marital Status - Married; Alcohol Use: Never; Drug Use: No History; Caffeine Use: Daily - tea, soda; Financial Concerns: No; Food, Clothing or Shelter Needs: No; Support System Lacking: No; Transportation Concerns: No Electronic Signature(s) Signed: 11/25/2022 12:37:49 PM By: [name] MD FACS Entered By: [name] on 11/25/2022 12:20:40 -------------------------------------------------------------------------------- SuperBill Details Patient Name: Date of Service: [name], [name] 11/25/2022 Medical Record Number: [id number] Patient Account Number: [id number] Date of Birth/Sex: Treating  RN: [birth year] (46 y.o. M) Primary Care Provider: Dorinda Hill Other Clinician: Referring Provider: Treating Provider/Extender: Jana Hakim in Treatment: 123 Diagnosis Coding ICD-10 Codes Code Description [phone] Non-pressure chronic ulcer of other part of left foot with fat layer exposed Facility Procedures : CPT4 Code: [id number] Description: 97597 - DEBRIDE WOUND 1ST 20 SQ CM OR < ICD-10 Diagnosis Description L97.522 Non-pressure chronic ulcer of other part of left foot with fat layer exposed Modifier: Quantity: 1 Physician Procedures : CPT4 Code Description Modifier [id number] - WC PHYS LEVEL 3 - EST PT 25 ICD-10  Diagnosis Description L97.522 Non-pressure chronic ulcer of other part of left foot with fat layer exposed Quantity: 1 : [id number] 97597 - WC PHYS DEBR WO ANESTH 20 SQ CM SOHAM, HOLLETT ([id number]) [id number] ICD-10 Diagnosis Description L97.522 Non-pressure chronic ulcer of other part of left foot with fat layer exposed Quantity: 1 27.pdf Page 16 of 16 Electronic Signature(s) Signed: 11/25/2022 12:29:54 PM By: [name] MD FACS Entered By: [name] on 11/25/2022 12:29:54

## 2022-11-26 NOTE — Progress Notes (Signed)
[name], [name] ([id number]) [id number] Page 1 of 6 Visit Report for 11/25/2022 Arrival Information Details Patient Name: Date of Service: [name], TO [name] E. 11/25/2022 11:00 A M Medical Record Number: [id number] Patient Account Number: [id number] Date of Birth/Sex: Treating RN: 1976/04/10 (47 y.o. M) Primary Care Merinda Victorino: Dorinda Hill Other Clinician: Referring Kaely Hollan: Treating Kunaal Walkins/Extender: Jana Hakim in Treatment: 123 Visit Information History Since Last Visit All ordered tests and consults were completed: No Patient Arrived: Wheel Chair Added or deleted any medications: No Arrival Time: 10:44 Any new allergies or adverse reactions: No Accompanied By: mother Had a fall or experienced change in No Transfer Assistance: None activities of daily living that may affect Patient Identification Verified: Yes risk of falls: Secondary Verification Process Completed: Yes Signs or symptoms of abuse/neglect since last visito No Patient Requires Transmission-Based Precautions: No Hospitalized since last visit: No Patient Has Alerts: No Implantable device outside of the clinic excluding No cellular tissue based products placed in the center since last visit: Pain Present Now: No Electronic Signature(s) Signed: 11/25/2022 11:55:36 AM By: Dayton Scrape Entered By: Dayton Scrape on 11/25/2022 10:44:29 -------------------------------------------------------------------------------- Encounter Discharge Information Details Patient Name: Date of Service: [name], [name] 11/25/2022 11:00 A M Medical Record Number: [id number] Patient Account Number: [id number] Date of Birth/Sex: Treating RN: 04/01/1976 (46 y.o. [name] Primary Care Phyllicia Dudek: Dorinda Hill Other Clinician: Referring Kinsler Soeder: Treating Shara Hartis/Extender: Jana Hakim in Treatment: 123 Encounter Discharge Information Items Post Procedure  Vitals Discharge Condition: Stable Temperature (F): 98.3 Ambulatory Status: Wheelchair Pulse (bpm): 90 Discharge Destination: Home Respiratory Rate (breaths/min): 18 Transportation: Private Auto Blood Pressure (mmHg): 124/77 Accompanied By: [name] Schedule Follow-up Appointment: Yes Clinical Summary of Care: Patient Declined Electronic Signature(s) Signed: 11/25/2022 5:08:39 PM By: [name] RN Entered By: [name] on 11/25/2022 17:06:45 -------------------------------------------------------------------------------- Lower Extremity Assessment Details Patient Name: Date of Service: [name], TO [name] E. 11/25/2022 11:00 A M Medical Record Number: [id number] Patient Account Number: [id number] Date of Birth/Sex: Treating RN: 10-Nov-1975 (47 y.o. [name] Primary Care Tyreece Gelles: Dorinda Hill Other Clinician: Referring Beautiful Pensyl: Treating Chuck Caban/Extender: Jana Hakim in Treatment: 123 Edema Assessment Assessed: [name]: No] Franne Forts: No] G[LeftNICCOLAS, LOEPER ([id number])] [Right: [id number].pdf Page 2 of 6] Edema: [Left: N] [Right: o] Calf Left: Right: Point of Measurement: 29 cm From Medial Instep 44 cm Ankle Left: Right: Point of Measurement: 9 cm From Medial Instep 24 cm Vascular Assessment Pulses: Dorsalis Pedis Palpable: [Left:Yes] Electronic Signature(s) Signed: 11/25/2022 5:08:39 PM By: [name] RN Entered By: [name] on 11/25/2022 11:05:40 -------------------------------------------------------------------------------- Multi Wound Chart Details Patient Name: Date of Service: [name], [name] 11/25/2022 11:00 A M Medical Record Number: [id number] Patient Account Number: [id number] Date of Birth/Sex: Treating RN: Mar 23, 1976 (47 y.o. M) Primary Care Kimbrely Buckel: Dorinda Hill Other Clinician: Referring Elizer Bostic: Treating Charmon Thorson/Extender: Jana Hakim in Treatment: 123 Vital  Signs Height(in): 69 Pulse(bpm): 90 Weight(lbs): 280 Blood Pressure(mmHg): 124/77 Body Mass Index(BMI): 41.3 Temperature(F): 98.3 Respiratory Rate(breaths/min): 18 [2:Photos:] [N/A:N/A] Left Calcaneus N/A N/A Wound Location: Pressure Injury N/A N/A Wounding Event: Pressure Ulcer N/A N/A Primary Etiology: Asthma, Angina, Hypertension N/A N/A Comorbid History: 10/07/2019 N/A N/A Date Acquired: 123 N/A N/A Weeks of Treatment: Open N/A N/A Wound Status: No N/A N/A Wound Recurrence: 4x1.1x0.1 N/A N/A Measurements L x W x D (cm) 3.456 N/A N/A A (cm) : rea 0.346 N/A N/A Volume (cm) : 87.20% N/A N/A % Reduction in A  rea: 98.70% N/A N/A % Reduction in Volume: Category/Stage III N/A N/A Classification: Medium N/A N/A Exudate A mount: Serosanguineous N/A N/A Exudate Type: red, brown N/A N/A Exudate Color: Thickened N/A N/A Wound Margin: Medium (34-66%) N/A N/A Granulation A mount: Red, Pink N/A N/A Granulation Quality: Medium (34-66%) N/A N/A Necrotic A mount: Eschar, Adherent Slough N/A N/A Necrotic Tissue: Fat Layer (Subcutaneous Tissue): Yes N/A N/A Exposed Structures: Fascia: No Tendon: No CLEARENCE, VITUG ([id number]) [phone].pdf Page 3 of 6 Muscle: No Joint: No Bone: No Medium (34-66%) N/A N/A Epithelialization: Debridement - Selective/Open Wound N/A N/A Debridement: Pre-procedure Verification/Time Out 11:20 N/A N/A Taken: Lidocaine 5% topical ointment N/A N/A Pain Control: Slough N/A N/A Tissue Debrided: Non-Viable Tissue N/A N/A Level: 4.4 N/A N/A Debridement A (sq cm): rea Curette N/A N/A Instrument: Minimum N/A N/A Bleeding: Pressure N/A N/A Hemostasis A chieved: 0 N/A N/A Procedural Pain: 0 N/A N/A Post Procedural Pain: Procedure was tolerated well N/A N/A Debridement Treatment Response: 4x1.1x0.1 N/A N/A Post Debridement Measurements L x W x D (cm) 0.346 N/A N/A Post Debridement Volume:  (cm) Category/Stage III N/A N/A Post Debridement Stage: Callus: Yes N/A N/A Periwound Skin Texture: Scarring: Yes Maceration: Yes N/A N/A Periwound Skin Moisture: Dry/Scaly: No No Abnormalities Noted N/A N/A Periwound Skin Color: No Abnormality N/A N/A Temperature: Debridement N/A N/A Procedures Performed: Treatment Notes Electronic Signature(s) Signed: 11/25/2022 12:18:18 PM By: [name] MD FACS Entered By: [name] on 11/25/2022 12:18:18 -------------------------------------------------------------------------------- Multi-Disciplinary Care Plan Details Patient Name: Date of Service: [name], [name] 11/25/2022 11:00 A M Medical Record Number: [id number] Patient Account Number: [id number] Date of Birth/Sex: Treating RN: [birth year] (47 y.o. [name] Primary Care Loui Massenburg: Dorinda Hill Other Clinician: Referring Izaak Sahr: Treating Navdeep Fessenden/Extender: Jana Hakim in Treatment: 123 Multidisciplinary Care Plan reviewed with physician Active Inactive Wound/Skin Impairment Nursing Diagnoses: Impaired tissue integrity Knowledge deficit related to ulceration/compromised skin integrity Goals: Patient/caregiver will verbalize understanding of skin care regimen Date Initiated: 07/15/2020 Target Resolution Date: 05/08/2023 Goal Status: Active Ulcer/skin breakdown will have a volume reduction of 30% by week 4 Date Initiated: 07/15/2020 Date Inactivated: 08/20/2020 Target Resolution Date: 09/03/2020 Goal Status: Unmet Unmet Reason: no major changes. Ulcer/skin breakdown will heal within 14 weeks Date Initiated: 12/04/2020 Date Inactivated: 12/10/2020 Target Resolution Date: 12/10/2020 Unmet Reason: wounds still open at 14 Goal Status: Unmet weeks and today 21 weeks. Interventions: Assess patient/caregiver ability to obtain necessary supplies Assess patient/caregiver ability to perform ulcer/skin care regimen upon admission and as  needed Assess ulceration(s) every visit Provide education on ulcer and skin care Treatment Activities: SHRIHAAN, PORZIO ([id number]) [phone].pdf Page 4 of 6 Skin care regimen initiated : 07/15/2020 Topical wound management initiated : 07/15/2020 Notes: Electronic Signature(s) Signed: 11/25/2022 5:08:39 PM By: [name] RN Entered By: [name] on 11/25/2022 17:03:48 -------------------------------------------------------------------------------- Pain Assessment Details Patient Name: Date of Service: [name], TO [name] E. 11/25/2022 11:00 A M Medical Record Number: [id number] Patient Account Number: [id number] Date of Birth/Sex: Treating RN: 27-Apr-1976 (47 y.o. M) Primary Care Kynlei Piontek: Dorinda Hill Other Clinician: Referring Yuepheng Schaller: Treating Hope Brandenburger/Extender: Jana Hakim in Treatment: 123 Active Problems Location of Pain Severity and Description of Pain Patient Has Paino No Site Locations Pain Management and Medication Current Pain Management: Electronic Signature(s) Signed: 11/25/2022 11:55:36 AM By: Dayton Scrape Entered By: Dayton Scrape on 11/25/2022 10:45:07 -------------------------------------------------------------------------------- Patient/Caregiver Education Details Patient Name: Date of Service: [name], [name] 4/19/2024andnbsp11:00 A M Medical Record Number: [id number]  Patient Account Number: [id number] Date of Birth/Gender: Treating RN: 05-28-1976 (47 y.o. [name] Primary Care Physician: Dorinda Hill Other Clinician: Referring Physician: Treating Physician/Extender: Jana Hakim in Treatment: 123 Education Assessment Education Provided To: Patient Education Topics Provided Wound/Skin Impairment: Methods: Explain/Verbal AMAAD, BYERS ([id number]) [phone].pdf Page 5 of 6 Responses: Return demonstration correctly Electronic Signature(s) Signed:  11/25/2022 5:08:39 PM By: [name] RN Entered By: [name] on 11/25/2022 17:04:03 -------------------------------------------------------------------------------- Wound Assessment Details Patient Name: Date of Service: [name], TO [name] E. 11/25/2022 11:00 A M Medical Record Number: [id number] Patient Account Number: [id number] Date of Birth/Sex: Treating RN: Feb 17, 1976 (47 y.o. [name] Primary Care Ieshia Hatcher: Dorinda Hill Other Clinician: Referring Faatima Tench: Treating Dmoni Fortson/Extender: Jana Hakim in Treatment: 123 Wound Status Wound Number: 2 Primary Etiology: Pressure Ulcer Wound Location: Left Calcaneus Wound Status: Open Wounding Event: Pressure Injury Comorbid History: Asthma, Angina, Hypertension Date Acquired: 10/07/2019 Weeks Of Treatment: 123 Clustered Wound: No Photos Wound Measurements Length: (cm) 4 Width: (cm) 1.1 Depth: (cm) 0.1 Area: (cm) 3.456 Volume: (cm) 0.346 % Reduction in Area: 87.2% % Reduction in Volume: 98.7% Epithelialization: Medium (34-66%) Tunneling: No Undermining: No Wound Description Classification: Category/Stage III Wound Margin: Thickened Exudate Amount: Medium Exudate Type: Serosanguineous Exudate Color: red, brown Foul Odor After Cleansing: No Slough/Fibrino Yes Wound Bed Granulation Amount: Medium (34-66%) Exposed Structure Granulation Quality: Red, Pink Fascia Exposed: No Necrotic Amount: Medium (34-66%) Fat Layer (Subcutaneous Tissue) Exposed: Yes Necrotic Quality: Eschar, Adherent Slough Tendon Exposed: No Muscle Exposed: No Joint Exposed: No Bone Exposed: No Periwound Skin Texture Texture Color No Abnormalities Noted: No No Abnormalities Noted: Yes Callus: Yes Temperature / Pain Scarring: Yes Temperature: No Abnormality Moisture No Abnormalities Noted: No Dry / Scaly: No KAVIAN, PETERS ([id number]) [phone].pdf Page 6 of 6 Maceration:  Yes Treatment Notes Wound #2 (Calcaneus) Wound Laterality: Left Cleanser Normal Saline Discharge Instruction: Cleanse the wound with Normal Saline prior to applying a clean dressing using gauze sponges, not tissue or cotton balls. Wound Cleanser Discharge Instruction: Cleanse the wound with wound cleanser prior to applying a clean dressing using gauze sponges, not tissue or cotton balls. Peri-Wound Care Zinc Oxide Ointment 30g tube Discharge Instruction: Apply Zinc Oxide to periwound with each dressing change Topical Gentamicin Discharge Instruction: On hold As directed by physician compounding topical antibiotics from [facility] pharmacy Discharge Instruction: on hold 4/08/16/22 apply directly to wound bed. [name] Echevaria) Primary Dressing Cutimed Sorbact Swab Discharge Instruction: Apply to wound bed as instructed Secondary Dressing Optifoam Non-Adhesive Dressing, 4x4 in Discharge Instruction: Apply over primary dressing as directed. Woven Gauze Sponge, Non-Sterile 4x4 in Discharge Instruction: Apply over primary dressing as directed. Secured With 7M Medipore H Soft Cloth Surgical T ape, 4 x 10 (in/yd) Discharge Instruction: Secure with tape as directed. Compression Wrap Kerlix Roll 4.5x3.1 (in/yd) Discharge Instruction: Apply Kerlix and Coban compression as directed. Compression Stockings Add-Ons Electronic Signature(s) Signed: 11/25/2022 5:08:39 PM By: [name] RN Entered By: [name] on 11/25/2022 11:10:11 -------------------------------------------------------------------------------- Vitals Details Patient Name: Date of Service: [name], [name] 11/25/2022 11:00 A M Medical Record Number: [id number] Patient Account Number: [id number] Date of Birth/Sex: Treating RN: Apr 27, 1976 (47 y.o. M) Primary Care Jaykwon Morones: Dorinda Hill Other Clinician: Referring Nam Vossler: Treating Kordel Leavy/Extender: Jana Hakim in Treatment: 123 Vital Signs Time  Taken: 10:44 Temperature (F): 98.3 Height (in): 69 Pulse (bpm): 90 Weight (lbs): 280 Respiratory Rate (breaths/min): 18 Body Mass Index (BMI): 41.3 Blood Pressure (mmHg): 124/77 Reference  Range: 80 - 120 mg / dl Electronic Signature(s) Signed: 11/25/2022 11:55:36 AM By: Dayton Scrape Entered By: Dayton Scrape on 11/25/2022 10:44:58

## 2022-11-28 ENCOUNTER — Other Ambulatory Visit (INDEPENDENT_AMBULATORY_CARE_PROVIDER_SITE_OTHER): Payer: Self-pay | Admitting: Family Medicine

## 2022-11-28 DIAGNOSIS — E559 Vitamin D deficiency, unspecified: Secondary | ICD-10-CM

## 2022-12-01 ENCOUNTER — Encounter: Payer: Self-pay | Admitting: Behavioral Health

## 2022-12-01 ENCOUNTER — Encounter (HOSPITAL_BASED_OUTPATIENT_CLINIC_OR_DEPARTMENT_OTHER): Payer: PPO | Admitting: General Surgery

## 2022-12-01 ENCOUNTER — Ambulatory Visit (INDEPENDENT_AMBULATORY_CARE_PROVIDER_SITE_OTHER): Payer: PPO | Admitting: Behavioral Health

## 2022-12-01 DIAGNOSIS — F99 Mental disorder, not otherwise specified: Secondary | ICD-10-CM

## 2022-12-01 DIAGNOSIS — F39 Unspecified mood [affective] disorder: Secondary | ICD-10-CM

## 2022-12-01 DIAGNOSIS — F5105 Insomnia due to other mental disorder: Secondary | ICD-10-CM | POA: Diagnosis not present

## 2022-12-01 DIAGNOSIS — L97522 Non-pressure chronic ulcer of other part of left foot with fat layer exposed: Secondary | ICD-10-CM | POA: Diagnosis not present

## 2022-12-01 DIAGNOSIS — F331 Major depressive disorder, recurrent, moderate: Secondary | ICD-10-CM | POA: Diagnosis not present

## 2022-12-01 DIAGNOSIS — F411 Generalized anxiety disorder: Secondary | ICD-10-CM | POA: Diagnosis not present

## 2022-12-01 DIAGNOSIS — L89623 Pressure ulcer of left heel, stage 3: Secondary | ICD-10-CM | POA: Diagnosis not present

## 2022-12-01 MED ORDER — TRAZODONE HCL 100 MG PO TABS
200.0000 mg | ORAL_TABLET | Freq: Every day | ORAL | 3 refills | Status: DC
Start: 2022-12-01 — End: 2023-03-01

## 2022-12-01 MED ORDER — LAMOTRIGINE 100 MG PO TABS: 100.0000 mg | ORAL_TABLET | Freq: Every day | ORAL | 1 refills | Status: DC

## 2022-12-01 MED ORDER — DULOXETINE HCL 60 MG PO CPEP: ORAL_CAPSULE | ORAL | 1 refills | Status: DC

## 2022-12-01 NOTE — Progress Notes (Signed)
RIDGE LAFOND [id number] 30-Jul-1976 47 y.o.  Virtual Visit via Telephone Note  I connected with pt on 12/01/22 at  1:00 PM EDT by telephone and verified that I am speaking with the correct person using two identifiers.   I discussed the limitations, risks, security and privacy concerns of performing an evaluation and management service by telephone and the availability of in person appointments. I also discussed with the patient that there may be a patient responsible charge related to this service. The patient expressed understanding and agreed to proceed.   I discussed the assessment and treatment plan with the patient. The patient was provided an opportunity to ask questions and all were answered. The patient agreed with the plan and demonstrated an understanding of the instructions.   The patient was advised to call back or seek an in-person evaluation if the symptoms worsen or if the condition fails to improve as anticipated.  I provided 30 minutes of non-face-to-face time during this encounter.  The patient was located at home.  The provider was located at [facility].   Joan Flores, NP   Subjective:   Patient ID:  [name] is a 47 y.o. (DOB April 09, 1976) male.  Chief Complaint:  Chief Complaint  Patient presents with   Anxiety   Depression   Follow-up   Medication Refill   Patient Education    HPI 47 year old male presents to this office via telephonic interview for follow up and medicaiton management. Says that his depression and anxiety have improved significantly. He has been trying to get more sunlight. Purchased a mood lamp. Says that he and wife have been getting along much better.  Says he has experience some recent depression and would like to stay on his medication for now.  He says his depression is 4/10 and anxiety is 2/10. He is sleeping 7-8 hours per night. Requesting no medication changes this visit. Denies mania, no psychosis, no current SI. No  HI.    Prior psychiatric medication trials: Auvelity- dry mouth     Review of Systems:  Review of Systems  Constitutional: Negative.   Allergic/Immunologic: Negative.   Neurological: Negative.   Psychiatric/Behavioral: Negative.      Medications: I have reviewed the patient's current medications.  Current Outpatient Medications  Medication Sig Dispense Refill   albuterol (VENTOLIN HFA) 108 (90 Base) MCG/ACT inhaler TAKE 2 PUFFS BY MOUTH EVERY 4 HOURS AS NEEDED 18 each 1   amLODipine (NORVASC) 2.5 MG tablet Take 1 tablet (2.5 mg total) by mouth daily. 30 tablet 5   BREZTRI AEROSPHERE 160-9-4.8 MCG/ACT AERO Inhale 2 puffs into the lungs in the morning and at bedtime. 10.7 g 5   DULoxetine (CYMBALTA) 60 MG capsule 1 capsule Orally Once a day for 90 90 capsule 1   ergocalciferol (VITAMIN D2) 1.25 MG (50000 UT) capsule      HYDROcodone-acetaminophen (NORCO) 5-325 MG tablet Take 1 tablet by mouth 2 (two) times daily as needed for moderate pain. 60 tablet 0   ipratropium (ATROVENT) 0.06 % nasal spray Place 2 sprays into both nostrils 3 (three) times daily. 15 mL 5   lamoTRIgine (LAMICTAL) 100 MG tablet Take 1 tablet (100 mg total) by mouth daily. 90 tablet 1   metoprolol tartrate (LOPRESSOR) 25 MG tablet TAKE 1 TABLET BY MOUTH TWICE A DAY 180 tablet 3   montelukast (SINGULAIR) 10 MG tablet Take 1 tablet (10 mg total) by mouth daily. 90 tablet 1   oxyCODONE (ROXICODONE) 5  MG immediate release tablet Take 1 tablet (5 mg total) by mouth every 6 (six) hours as needed for severe pain (max 2 tabs/day for now). Do Not Fill Before 12/ 21/2023 60 tablet 0   pantoprazole (PROTONIX) 40 MG tablet Take 1 tablet by mouth daily.     tirzepatide [facility]) 2.5 MG/0.5ML Pen Inject 2.5 mg into the skin once a week. 2 mL 0   traZODone (DESYREL) 100 MG tablet Take 2 tablets (200 mg total) by mouth at bedtime. 180 tablet 3   triamcinolone (NASACORT) 55 MCG/ACT AERO nasal inhaler Place 2 sprays into the nose  daily.     Vitamin D, Ergocalciferol, (DRISDOL) 1.25 MG (50000 UNIT) CAPS capsule Take 1 capsule (50,000 Units total) by mouth every 7 (seven) days. 5 capsule 0   No current facility-administered medications for this visit.    Medication Side Effects: None  Allergies: No Known Allergies  Past Medical History:  Diagnosis Date   Anginal pain (HCC)    with covid   Anxiety    Asthma    Depression    Dyspnea    GERD (gastroesophageal reflux disease)    Headache    History of kidney stones    LEFT URETERAL STONE   HTN (hypertension)    Pancreatitis 2018   GALLBALDDER SLUDGE CAUSED ISSUED RESOLVED   Pneumonia 07/2019   covid   Prediabetes    SOBOE (shortness of breath on exertion)     Family History  Problem Relation Age of Onset   Migraines Mother    GER disease Mother    Heart disease Father    Hyperlipidemia Father    Diabetes Father    Hypertension Father    Sleep apnea Father    Obesity Father    Asthma Brother    Pancreatic cancer Maternal Grandmother    Heart disease Maternal Grandfather    Diabetes Paternal Grandmother    Hypertension Paternal Grandmother     Social History   Socioeconomic History   Marital status: Married    Spouse name: Not on file   Number of children: 1   Years of education: Not on file   Highest education level: Not on file  Occupational History   Occupation: delivery driver   Occupation: Stay at home spouse  Tobacco Use   Smoking status: Never   Smokeless tobacco: Never  Vaping Use   Vaping Use: Never used  Substance and Sexual Activity   Alcohol use: Yes    Comment: rarely 2-3 times a year   Drug use: No   Sexual activity: Yes    Partners: Female  Other Topics Concern   Not on file  Social History Narrative   Not on file   Social Determinants of Health   Financial Resource Strain: Not on file  Food Insecurity: Not on file  Transportation Needs: Not on file  Physical Activity: Not on file  Stress: Not on file   Social Connections: Not on file  Intimate Partner Violence: Not on file    Past Medical History, Surgical history, Social history, and Family history were reviewed and updated as appropriate.   Please see review of systems for further details on the patient's review from today.   Objective:   Physical Exam:  There were no vitals taken for this visit.  Physical Exam Constitutional:      General: He is not in acute distress.    Appearance: Normal appearance.  Neurological:     Mental Status: He is  alert and oriented to person, place, and time.     Gait: Gait normal.  Psychiatric:        Attention and Perception: Attention and perception normal. He does not perceive auditory or visual hallucinations.        Mood and Affect: Mood and affect normal. Mood is not anxious or depressed. Affect is not labile.        Speech: Speech normal.        Behavior: Behavior normal. Behavior is cooperative.        Thought Content: Thought content normal.        Cognition and Memory: Cognition and memory normal.        Judgment: Judgment normal.     Lab Review:     Component Value Date/Time   NA 145 (H) 09/21/2022 1243   K 4.3 09/21/2022 1243   CL 102 09/21/2022 1243   CO2 22 09/21/2022 1243   GLUCOSE 99 09/21/2022 1243   GLUCOSE 129 (H) 08/31/2020 0000   BUN 17 09/21/2022 1243   CREATININE 0.94 09/21/2022 1243   CREATININE 1.10 08/31/2020 0000   CALCIUM 9.1 09/21/2022 1243   PROT 6.7 09/21/2022 1243   ALBUMIN 4.0 (L) 09/21/2022 1243   AST 21 09/21/2022 1243   ALT 23 09/21/2022 1243   ALKPHOS 91 09/21/2022 1243   BILITOT 0.4 09/21/2022 1243   GFRNONAA 80 08/31/2020 0000   GFRAA 93 08/31/2020 0000       Component Value Date/Time   WBC 6.6 09/21/2022 1243   WBC 7.7 08/31/2020 0000   RBC 6.06 (H) 09/21/2022 1243   RBC 5.66 08/31/2020 0000   HGB 16.0 09/21/2022 1243   HCT 51.0 09/21/2022 1243   PLT 132 (L) 09/21/2022 1243   MCV 84 09/21/2022 1243   MCH 26.4 (L) 09/21/2022 1243    MCH 28.3 08/31/2020 0000   MCHC 31.4 (L) 09/21/2022 1243   MCHC 33.8 08/31/2020 0000   RDW 12.7 09/21/2022 1243   LYMPHSABS 2.0 09/21/2022 1243   MONOABS 0.7 12/02/2019 0628   EOSABS 0.1 09/21/2022 1243   BASOSABS 0.1 09/21/2022 1243    No results found for: "POCLITH", "LITHIUM"   No results found for: "PHENYTOIN", "PHENOBARB", "VALPROATE", "CBMZ"   .res Assessment: Plan:    Greater than 50% of 30 min telephonic visit with patient was spent on counseling and coordination of care.  We discussed patients chronic health concerns and his continue wound care for feet. Reporting his anxiety and depression have improved overall. He does not want to change his medication regimen for now. Reconciled medications.  We agreed to: Continue  Cymbalta to 60 mg daily.  Continue Trazodone 200 mg at bedtime Continue Lamictal to 100 mg daily, patients request.  Will report any side effects or worsening symptoms To follow up in 3 months  to reassess Provided emergency contact information Reviewed PDMP        Camille was seen today for anxiety, depression, follow-up, medication refill and patient education.  Diagnoses and all orders for this visit:  Major depressive disorder, recurrent episode, moderate -     DULoxetine (CYMBALTA) 60 MG capsule; 1 capsule Orally Once a day for 90 -     lamoTRIgine (LAMICTAL) 100 MG tablet; Take 1 tablet (100 mg total) by mouth daily.  Generalized anxiety disorder -     DULoxetine (CYMBALTA) 60 MG capsule; 1 capsule Orally Once a day for 90 -     lamoTRIgine (LAMICTAL) 100 MG tablet; Take 1 tablet (100  mg total) by mouth daily.  Unspecified mood (affective) disorder -     DULoxetine (CYMBALTA) 60 MG capsule; 1 capsule Orally Once a day for 90 -     lamoTRIgine (LAMICTAL) 100 MG tablet; Take 1 tablet (100 mg total) by mouth daily.  Insomnia due to other mental disorder -     traZODone (DESYREL) 100 MG tablet; Take 2 tablets (200 mg total) by mouth at  bedtime.    Please see After Visit Summary for patient specific instructions.  Future Appointments  Date Time Provider Department Center  12/09/2022  9:45 AM [name], MD [facility]  12/12/2022 10:20 AM Cathey Endow, Scot Jun, DO [facility] None  12/15/2022 10:30 AM [name], MD [facility]  01/17/2023 11:15 AM Alfonse Spruce, MD [facility] None  01/27/2023 10:20 AM Lovorn, Aundra Millet, MD [facility] CPR    No orders of the defined types were placed in this encounter.     -------------------------------

## 2022-12-05 DIAGNOSIS — L97512 Non-pressure chronic ulcer of other part of right foot with fat layer exposed: Secondary | ICD-10-CM | POA: Diagnosis not present

## 2022-12-06 NOTE — Progress Notes (Signed)
QUASHAWN, JEWKES ([id number]) [id number] Page 1 of 16 Visit Report for 12/01/2022 Chief Complaint Document Details Patient Name: Date of Service: [name], [name] 12/01/2022 9:30 A M Medical Record Number: [id number] Patient Account Number: [id number] Date of Birth/Sex: Treating RN: 1976/06/01 (47 y.o. M) Primary Care Provider: Dorinda Hill Other Clinician: Referring Provider: Treating Provider/Extender: Jana Hakim in Treatment: 124 Information Obtained from: Patient Chief Complaint Bilateral Plantar Foot Ulcers Electronic Signature(s) Signed: 12/01/2022 12:02:22 PM By: [name] MD FACS Entered By: [name] on 12/01/2022 12:02:22 -------------------------------------------------------------------------------- Debridement Details Patient Name: Date of Service: [name], [name] [place] [name] 12/01/2022 9:30 A M Medical Record Number: [id number] Patient Account Number: [id number] Date of Birth/Sex: Treating RN: 10-04-1975 (47 y.o. [name] Primary Care Provider: Dorinda Hill Other Clinician: Referring Provider: Treating Provider/Extender: Jana Hakim in Treatment: 124 Debridement Performed for Assessment: Wound #2 Left Calcaneus Performed By: Physician [name], MD Debridement Type: Debridement Level of Consciousness (Pre-procedure): Awake and Alert Pre-procedure Verification/Time Out Yes - 10:17 Taken: Start Time: 10:18 Pain Control: Lidocaine 4% T opical Solution Percent of Wound Bed Debrided: 100% T Area Debrided (cm): otal 10.36 Tissue and other material debrided: Viable, Non-Viable, Callus, Skin: Dermis , Skin: Epidermis Level: Skin/Epidermis Debridement Description: Selective/Open Wound Instrument: Curette Bleeding: Minimum Hemostasis Achieved: Pressure End Time: 10:20 Procedural Pain: 4 Post Procedural Pain: 4 Response [name] Treatment: Procedure was tolerated well Level of  Consciousness (Post- Awake and Alert procedure): Post Debridement Measurements of Total Wound Length: (cm) 5.5 Stage: Category/Stage III Width: (cm) 2.4 Depth: (cm) 0.2 Volume: (cm) 2.073 Character of Wound/Ulcer Post Debridement: Requires Further Debridement Post Procedure Diagnosis Same as Pre-procedure Notes Scribed for Dr. Lady Gary by Brenton Grills RN. Electronic Signature(s) Signed: 12/01/2022 4:24:20 PM By: [name] MD [name] ([id number]) [id number] Page 2 of 16 Signed: 12/05/2022 4:01:24 PM By: Brenton Grills Entered By: Brenton Grills on 12/01/2022 10:41:50 -------------------------------------------------------------------------------- HPI Details Patient Name: Date of Service: [name], [name] [place] [name] 12/01/2022 9:30 A M Medical Record Number: [id number] Patient Account Number: [id number] Date of Birth/Sex: Treating RN: Aug 07, 1976 (47 y.o. M) Primary Care Provider: Dorinda Hill Other Clinician: Referring Provider: Treating Provider/Extender: Jana Hakim in Treatment: 124 History of Present Illness HPI Description: Wounds are12/03/2020 upon evaluation today patient presents for initial inspection here in our clinic concerning issues he has been having with the bottoms of his feet bilaterally. He states these actually occurred as wounds when he was hospitalized for 5 months secondary [name] Covid. He was apparently with tilting bed where he was in an upright position quite frequently and apparently this occurred in some way shape or form during that time. Fortunately there is no sign of active infection at this time. No fevers, chills, nausea, vomiting, or diarrhea. With that being said he still has substantial wounds on the plantar aspects of his feet Theragen require quite a bit of work [name] get these [name] heal. He has been using Santyl currently though that is been problematic both in receiving the medication as well as  actually paid for it as it is become quite expensive. Prior [name] the experience with Covid the patient really did not have any major medical problems other than hypertension he does have some mild generalized weakness following the Covid experience. 07/22/2020 on evaluation today patient appears [name] be doing okay in regard [name] his foot ulcers I feel like the wound beds are showing signs of better improvement that I  do believe the Iodoflex is helping in this regard. With that being said he does have a lot of drainage currently and this is somewhat blue/green in nature which is consistent with Pseudomonas. I do think a culture today would be appropriate for [place] [name] evaluate and see if that is indeed the case I would likely start him on antibiotic orally as well he is not allergic [name] Cipro knows of no issues he has had in the past 12/21; patient was admitted [name] the clinic earlier this month with bilateral presumed pressure ulcers on the bottom of his feet apparently related [name] excessive pressure from a tilt table arrangement in the intensive care unit. Patient relates this [name] being on ECMO but I am not really sure that is exactly related [name] that. I must say I have never seen anything like this. He has fairly extensive full-thickness wounds extending from his heel towards his midfoot mostly centered laterally. There is already been some healing distally. He does not appear [name] have an arterial issue. He has been using gentamicin [name] the wound surfaces with Iodoflex [name] help with ongoing debridement 1/6; this is a patient with pressure ulcers on the bottom of his feet related [name] excessive pressure from a standing position in the intensive care unit. He is complaining of a lot of pain in the right heel. He is not a diabetic. 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 We have been using Iodoflex [name] help prepare the surfaces of both wounds for an advanced treatment product. He is nonambulatory  spending most of his time in a wheelchair I have asked him not [name] propel the wheelchair with his heels 1/13; in general his wounds look better not much surface area change we have been using Iodoflex as of last week. I did an x-ray of the right heel as the patient was complaining of pain. I had some thoughts about a stress fracture perhaps Achilles tendon problems however what it showed was erosive changes along the inferior aspect of the calcaneus he now has a MRI booked for 1/20. 1/20; in general his wounds continue [name] be better. Some improvement in the large narrow areas proximally in his foot. He is still complaining of pain in the right heel and tenderness in certain areas of this wound. His MRI is tonight. I am not just looking for osteomyelitis that was brought up on the x-ray I am wondering about stress fractures, tendon ruptures etc. He has no such findings on the left. Also noteworthy is that the patient had critical illness neuropathy and some of the discomfort may be actual improvement in nerve function I am just not sure. These wounds were initially in the setting of severe critical illness related [name] COVID-19. He was put in a standing position. He may have also been on pressors at the point contributing [name] tissue ischemia. By his description at some point these wounds were grossly necrotic extending proximally up into the Achilles part of his heel. I do not know that I have ever really seen pictures of them like this although they may exist in epic We have ordered Tri layer Oasis. I am trying [name] stimulate some granulation in these areas. This is of course assuming the MRI is negative for infection 1/27; since the patient was last here he saw Dr. Earlene Plater of infectious disease. He is planned for vancomycin and ceftriaxone. Prior operative culture grew MSSA. Also ordered baseline lab work. He also ordered arterial studies although  the ABIs in our clinic were normal as well as his clinical  exam these were normal I do not think he needs [name] see vascular surgery. His ABIs at the [facility] were 1.22 in the right triphasic waveforms with a normal TBI of 1.15 on the left ABI of 1.22 with triphasic waveforms and a normal TBI of 1.08. Finally he saw Dr. Logan Bores who will follow him in for 2 months. At this point I do not think he felt that he needed a procedure on the right calcaneal bone. Dr. Earlene Plater is elected for broad-spectrum antibiotic The patient is still having pain in the right heel. He walks with a walker 2/3; wounds are generally smaller. He is tolerating his IV antibiotics. I believe this is vancomycin and ceftriaxone. We are still waiting for Oasis burn in terms of his out-of-pocket max which he should be meeting soon given the IV antibiotics, MRIs etc. I have asked him [name] check in on this. We are using silver collagen in the meantime the wounds look better 2/10; tolerating IV vancomycin and Rocephin. We are waiting [name] apply for Oasis. Although I am not really sure where he is in his out-of-pocket max. 2/17 started the first application of Oasis trilayer. Still on antibiotics. The wounds have generally look better. The area on the left has a little more surface slough requiring debridement 2/24; second application of Oasis trilayer. The wound surface granulation is generally look better. The area on the left with undermining laterally I think is come in a bit. 10/08/2020 upon evaluation today patient is here today for [employer] application #3. Fortunately he seems [name] be doing extremely well with regard [name] this and we are seeing a lot of new epithelial growth which is great news. Fortunately there is no signs of active infection at this time. 10/16/2020 upon evaluation today patient appears [name] be doing well with regard [name] his foot ulcers. Do believe the Oasis has been of benefit for him. I do not see any signs of infection right now which is great news and I think that he has a lot of  new epithelial growth which is great [name] see as well. The patient is very pleased [name] hear all of this. I do think we can proceed with the Oasis trilayer #4 today. 3/18; not as much improvement in these areas on his heels that I was hoping. I did reapply trilateral Oasis today the tissue looks healthier but not as much fill in as I was hoping. 3/25; better looking today I think this is come in a bit the tissue looks healthier. Triple layer Oasis reapplied #6 4/1; somewhat better looking definitely better looking surface not as much change in surface area as I was hoping. He may be spending more time [place] on days then he needs [name] although he does have heel offloading boots. Triple layer Oasis reapplied ACESON, LABELL ([id number]) [id number] Page 3 of 16 4/7; unfortunately apparently [employer] will not approve any further Oasis which is unfortunate since the patient did respond nicely both in terms of the condition of the wound bed as well as surface area. There is still some drainage coming from the wound but not a lot there does not appear [name] be any infection 4/15; we have been using Hydrofera Blue. He continues [name] have nice rims of epithelialization on the right greater than the left. The left the epithelialization is coming from the tip of his heel. There is moderate drainage.  In this that concerns me about a total contact cast. There is no evidence of infection 4/29; patient has been using Hydrofera Blue with dressing changes. He has no complaints or issues today. 5/5; using Hydrofera Blue. I actually think that he looks marginally better than the last time I saw this 3 weeks ago. There are rims of epithelialization on the left thumb coming from the medial side on the right. Using Hydrofera Blue 5/12; using Hydrofera Blue. These continue [name] make improvements in surface area. His drainage was not listed as severe I therefore went ahead and put a cast on  the left foot. Right foot we will continue [name] dress his previous 5/16; back for first total contact cast change. He did not tolerate this particularly well cast injury on the anterior tibia among other issues. Difficulty sleeping. I talked him about this in some detail and afterwards is elected [name] continue. I told him I would like [name] have a cast on for 3 weeks [name] see if this is going [name] help at all. I think he agreed 5/19; I think the wound is better. There is no tunneling towards his midfoot. The undermining medially also looks better. He has a rim of new skin distally. I think we are making progress here. The area on the left also continues [name] look somewhat better [name] me using Hydrofera Blue. He has a list of complaints about the cast but none of them seem serious 5/26; patient presents for 1 week follow-up. He has been using a total contact cast and tolerating this well. Hydrofera Blue is the main dressing used. He denies signs of infection. 6/2 Hydrofera Blue total contact cast on the left. These were large ulcers that formed in intensive care unit where the patient was recovering from COVID. May have had something to do with being ventilated in an upright positiono Pressors etc. We have been able [name] get the areas down considerably and a viable surface. There is some epithelialization in both sides. Note made of drainage 6/9; changed [name] [facility] last time because of drainage. He arrives with better looking surfaces and dimensions on the left than the right. Paradoxically the right actually probes more towards his midfoot the left is largely close down but both of these look improved. Using a total contact cast on the left 6/16; complex wounds on his bilateral plantar heels which were initially pressure injury from a stay in the ICU with COVID. We have been using silver alginate most recently. His dimensions of come in quite dramatically however not recently. We have been putting the left foot  in a total contact cast 6/23; complex wounds on the bilateral plantar heels. I been putting the left in the cast paradoxically the area on the right is the one that is going towards closure at a faster rate. Quite a bit of drainage on the left. The patient went [name] see Dr. Logan Bores who said he was going [name] standby for skin grafts. I had actually considered sending him for skin grafts however he would be mandatorily off his feet for a period of weeks [name] months. I am thinking that the area on the right is going [name] close on its own the area on the left has been more stubborn even though we have him in a total contact cast 6/30; took him out of a total contact cast last week is the right heel seem [name] be making better progress than the left where I was placing the cast.  We are using silver alginate. Both wounds are smaller right greater than left 7/12; both wounds look as though they are making some progress. We are using silver alginate. Heel offloading boots 7/26; very gradual progress especially on the right. Using silver alginate. He is wearing heel offloading boots 8/18; he continues [name] close these wounds down very gradually. Using silver alginate. The problem polymen being definitive about this is areas of what appears [name] be callus around the margins. This is not a 100% of the area but certainly sizable especially on the right 9/1; bilateral plantar feet wounds secondary [name] prolonged pressure while being ventilated for COVID-19 in an upright position. Essentially pressure ulcers on the bottom of his feet. He is made substantial progress using silver alginate. 9/14; bilateral plantar feet wounds secondary [name] prolonged pressure. Making progress using silver alginate. 9/29 bilateral plantar feet wounds secondary [name] prolonged pressure. I changed him [name] Iodoflex last week. MolecuLight showing reddened blush fluorescence 10/11; patient presents for follow-up. He has no issues or complaints today. He denies  signs of infection. He continues [name] use Iodoflex and antibiotic ointment [name] the wound beds. 10/27; 2-week follow-up. No evidence of infection. He has callus and thick dry skin around the wound margins we have been using Iodoflex and Bactroban which was in response [name] a moderate left MolecuLight reddish blush fluorescence. 11/10; 2-week follow-up. Wound margins again have thick callus however the measurements of the actual wound sites are a lot smaller. Everything looks reasonably healthy here. We have been using Iodoflex He was approved for prime matrix but I have elected [name] delay this given the improvement in the surface area. Hopefully I will not regret that decision as were getting close [name] the end of the year in terms of insurance payment 12/8; 2-week follow-up. Wounds are generally smaller in size. These were initially substantial wounds extending into the forefoot all the way into the heel on the bilateral plantar feet. They are now both located on the plantar heel distal aspect both of these have a lot of callus around the wounds I used a #5 curette [name] remove this on the right and the left also some subcutaneous debris [name] try and get the wound edges were using Iodoflex. He has heel offloading shoe 12/22; 2-week follow-up. Not really much improvement. He has thick callus around the outer edges of both wounds. I remove this there is some nonviable subcutaneous tissue as well. We have been using Iodoflex. Her intake nurse and myself spontaneously thought of a total contact cast I went back in May. At that time we really were not seeing much of an improvement with a cast although the wound was in a much different situation I would like [name] retry this in 2 weeks and I discussed this with the patient 08/12/2021; the patient has had some improvement with the Iodoflex. The the area on the left heel plantar more improved than the right. I had [name] put him in a total contact cast on the left although I  decided [name] put that off for 2 weeks. I am going [name] change his primary dressing [name] silver collagen. I think in both areas he has had some improvement most of the healing seems [name] be more proximal in the heel. The wounds are in the mid aspect. A lot of thick callus on the right heel however. 1/19; we are using silver collagen on both plantar heel areas. He has had some improvement today. The left did not require any  debridement. He still had some eschar on the right that was debrided but both seem [name] have contracted. I did not put it total contact cast on him today 2/2 we have been using silver collagen. The area on the right plantar heel has areas that appear [name] be epithelialized interspersed with dry flaking callus and dry skin. I removed this. This really looks better than on the other side. On the left still a large area with raised edges and debris on the surface. 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 He has no issues or complaints today. 2/9; not much change in the left foot wound with 1 week of a cast we are using silver collagen. Silver collagen on the right side. The right side has been the better wound surface. We will reapply the total contact cast on the left 2/16; not much improvement on either side I been using silver collagen with a total contact cast on the left. I'm changing the Hydrofera Blue still with a total contact cast on the left 2/23; some improvement on both sides. Disappointing that he has thick callus around the area that we are putting in a total contact cast on the left. We've been using Hydrofera Blue on both wound areas. This is a man who at essentially pressure ulcers in addition [name] ischemia caused by medications [name] support his blood pressure (pressors) in the ICU. He was being ventilated in the standing position for severe Covid. A  Shiley the wounds extended across his entire foot but are now localized [name] his plantar heels bilaterally. We have made progress however neither areas healed. I continue [name] think the total contact cast is helped albeit painstakingly slowly. He has never wanted a plastic surgery consult although I don't know that they would be interested in grafting in area in this location. 10/07/2021: Continued improvement bilaterally. There is still some callus around the left wound, despite the total contact cast. He has some increased pain in his right midfoot around 1 particular area. This has been painful in the past but seems [name] be a little bit worse. When his cast was removed today, there was an area on the heel of the left foot that looks a bit macerated. He is also complaining of pain in his left thigh and hip which he thinks is secondary [name] the limb length discrepancy caused by the cast. 10/14/2021: He continues [name] improve. A little bit less callus around the left wound. He continues [name] endorse pain in his right midfoot, but this is not as significant as it was last week. The maceration on his left heel is improved. [name], [name] ([id number]) [id number] Page 4 of 16 10/21/2021: Continued improvement [name] both wounds. The maceration on his left heel is no longer evident. Less callus bilaterally. Epithelialization progressing. 10/28/2021: Significant improvement this week. The right sided wound is nearly closed with just a small open area at the middle. No maceration seen on the left heel. Continued epithelialization on both sides. No concern for infection. 11/04/2021: T oday, the wounds were measured a little bit differently and come out as larger, but I actually think they are about the same [name] potentially even smaller, particularly on the left. He continues [name] accumulate some callus on the right. 11/11/2021: T oday, the patient is expressing some concern that the left wound, despite being  in  the total contact cast, is not progressing at the same rate as the 1 on the right. He is interested in trying a week without the cast [name] see how the wound does. The wounds are roughly the same size as last week, with the right perhaps being a little bit smaller. He continues [name] build up callus on both sites. 11/18/2021: Last week, I permitted the patient [name] go without his total contact cast, just [name] see if the cast was really making any difference. Today, both wounds have deteriorated [name] some extent, suggesting that the cast is providing benefit, at least on the left. Both are larger and have accumulated callus, slough, and other debris. 11/26/2021: I debrided both wounds quite aggressively last week in an effort [name] stimulate the healing cascade. This appears [name] have been effective as the left sided wound is a full centimeter shorter in length. Although the right was measured slightly larger, on inspection, it looks as though an area of epithelialized tissue was included in the measurements. We have been using PolyMem Ag on the wound surfaces with a total contact cast [name] the left. 12/02/2021: It appears that the intake personnel are including epithelialized tissue in his wound measurements; the right wound is almost completely epithelialized; there is just a crater at the proximal midfoot with some open areas. On the left, he has built up some callus, but the overall wound surface looks good. There is some senescent skin around the wound margin. He has been in PolyMem Ag bilaterally with a total contact cast on the left. 12/09/2021: The right wound is nearly closed; there is just a small open area at the mid calcaneus. On the left, the wound is smaller with minimal callus buildup. No significant drainage. 12/16/2021: The right calcaneal wound remains minimally open at the mid calcaneus; the rest has epithelialized. On the left, the wound is also a little bit smaller. There is some senescent tissue on the  periphery. He is getting his first application of a trial skin substitute called Vendaje today. 12/23/2021: The wound on his right calcaneus is nearly closed; there is just a small area at the most distal aspect of the calcaneus that is open. On the left, the area where we applied [name] the skin substitute has a healthier-looking bed of granulation tissue. The wound dimensions are not significantly different on this side but the wound surface is improved. 12/30/2021: The wound on the right calcaneus has not changed significantly aside from some accumulation of callus. On the left, the open area is smaller and continues [name] have an improved surface. He continues [name] accumulate callus around the wound. He is here for his third application of Vendaje. 01/06/2022: The right calcaneal wound is down [name] just a couple of millimeters. He continues [name] accumulate periwound callus. He unfortunately got his cast wet earlier in the week and his left foot is macerated, resulting in some superficial skin loss just distal [name] the open wound. The open wound itself, however, is much smaller and has a healthier appearing surface. He is here for his fourth application of Vendaje. 01/13/2022: The right calcaneal wound is about the same. Unfortunately, once again, his cast got wet and his foot is again macerated. This is caused the left calcaneal wound [name] enlarge. He is here for his fifth application of Vendaje. 01/20/2022: The right calcaneal wound is very small. There is some periwound callus accumulation. He purchased a new cast protector last week and this has been effective in avoiding  the maceration that has been occurring on the left. The left calcaneal wound is narrower and has a healthy and viable-appearing surface. He is here for his 6 application of Vendaje. 01/27/2022: The right calcaneal wound is down [name] just a pinhole. There is some periwound slough and callus. On the left, the wound is narrower and shorter by about a  centimeter. The surface is robust and viable-appearing. Unfortunately, the rep for the trial skin substitute product did not provide any for [place] [name] use today. 02/04/2022: The right calcaneal wound remains unchanged. There is more accumulated callus. On the left, although the intake nurse measured it a little bit longer, it looks about the same [name] me. The surface has a layer of slough, but underneath this, there is good granulation tissue. 02/10/2022: The right calcaneus wound is nearly closed. There is still some callus that builds up around the site. The left side looks about the same in terms of dimensions, but the surface is more robust and vital-appearing. 02/16/2022: The area of the right calcaneus that was nearly closed last week has closed, but there is a small opening at the mid foot where it looks like some moisture got retained and caused some reopening. The left foot wound is narrower and shallower. Both sites have a fair amount of periwound callus and eschar. 02/24/2022: The small midfoot opening on the right calcaneus is a little bit smaller today. The left foot wound is narrower and shallower. He continues [name] accumulate periwound callus. No concern for infection. 03/01/2022: The patient came [name] clinic early because he showered and got his cast wet. Fortunately, there is no significant maceration [name] his foot but the callus softened and it looks like the wound on his left calcaneus may be a little bit wider. The wound on his right calcaneus is just a narrow slit. Continued accumulation of periwound callus bilaterally. 03/08/2022: The wound on his right calcaneus is very nearly closed, just a small pinpoint opening under a bit of eschar; the left wound has come in quite a bit, as well. It is narrower and shorter than at our last visit. Still with accumulated callus and eschar bilaterally. 03/17/2022: The right calcaneal wound is healed. The left wound is smaller and the surface itself is very clean,  but there is some blue-green staining on the periwound callus, concerning for Pseudomonas aeruginosa. 03/23/2022: The right calcaneal wound remains closed. The left wound continues [name] contract. No further blue-green staining. Small amount of callus and slough accumulation. 03/28/2022: He came in early today because he had gotten his cast wet. On inspection, the wound itself did not get wet or macerated, just a little bit of the forefoot. The wound itself is basically unchanged. 04/07/2022: The right foot wound remains closed. The left wound is the smallest that I have seen it [name] date. It is narrower and shorter. It still continues [name] accumulate slough on the surface. 04/15/2022: There is a band of epithelium now dividing the small left plantar foot wound in 2. There is still some slough on the surface. 04/21/2022: The wound continues [name] narrow. Just a little bit of slough on the surface. He seems [name] be responding well [name] endoform. 04/28/2022: Continued slow contraction of the wound. There is a little slough on the surface and some periwound callus. We have been using endoform and total contact cast. 05/05/2022: The wound appears [name] have stalled. There is slough and some periwound eschar/callus. No concern for infection, however. [name], [name] ([id number]) [id number]  Page 5 of 16 05/12/2022: Unfortunately, his right foot has reopened. It is located at the most posterior aspect of his surgical incision. The area was noted [name] have drainage coming from it when his padding was removed today. Underneath some callus and senescent skin, there is an opening. No purulent drainage or malodor. On the left foot, the wound is again unchanged. There is some light blue staining on the callus, but no malodor or purulent drainage. 10/13; right and left heel remanence of extensive plantar foot wounds. These are better than I remember by quite a big margin however he is still left with wounds on  the left plantar heel and the right plantar heel. Been using endoform bilaterally. A culture was done that showed apparently Pseudomonas but we are still waiting for the [facility] antibiotic [name] use gentamicin today. He is still very active by description I am not sure about the offloading of his noncasted right foot 10/20; both wounds right and left heel debrided not much change from last week. [name] Echevaria has arrived which is linezolid, gentamicin and ciprofloxacin we will use this with endoform. T contact cast on the left otal 06/02/2022: Both wounds are smaller today. There is still a fair amount of callus buildup around the right foot ulcer. The left is more superficial and nearly flush with the surrounding tissues. Also with slough and eschar buildup. 06/10/2022: The right sided wound appears [name] be nearly closed, if not completely so, although it is somewhat difficult [name] tell given the abnormal tissue and scarring in his foot. There is a fair amount of callus and crust accumulation. On the left, the wound looks about the same, again with callus and slough. He has an appointment next Thursday with Dr. Annamary Rummage in podiatry; I am hopeful that there may be some reconstructive options available for Mr. Cratty. 06/16/2022: Both wounds have some eschar and callus accumulation. The right sided wound is extremely narrow and barely open; the left is narrower than last week. There is a little bit of slough. He has his appointment in podiatry later today. 06/23/2022: The patient met with Dr. Annamary Rummage last week and unfortunately, there are no reconstructive options that he believes would be helpful. He did order an MRI [name] evaluate for osteomyelitis and fortunately, none was seen. The left sided wound is a little bit shorter and narrower today. The right sided wound is about the same. There is callus and eschar accumulation bilaterally. 06/29/2022: Both feet have improved from last week. There is epithelium  making a valiant effort [name] creep across the surface on the left. The right side looks like it got a little dry and the deep crevasse in his midfoot has cracked. Both have eschar and there is some slough on the left. 07/07/2022: Both feet have improved. There is epithelium completely covering the calcaneus on the right with just a small opening in the crevasse in his midfoot. On the left, the open area of tissue is smaller but he continues [name] build up callus/eschar and slough. 07/15/2022: The opening in the midfoot on the right is about the same size, covered with eschar and a little bit of slough. The open portion of the left wound is narrower and shorter with a bit of slough buildup. He admits [name] being on his feet more than recommended. 12/14; as far as I can tell everything on the right foot is closed. There is some eschar I removed some of this I cannot identify any open wound here. As usual  this will be a very vulnerable area going forward. On the left this looks really quite healthy. I was pleasantly surprised [name] see how good this looked. Wound is certainly smaller and there appears [name] be healthy epithelialization. He has been using Promogran on the right and endoform on the left. He has been offloading the right foot with a heel offloading boot and he has a running shoe on the right foot 08/04/2022: The right foot remains closed. He has a thick cushioned insole in his sneaker. The left sided wound is smaller with just some slough and eschar accumulation. He is wearing the heel off loader on this foot. 08/15/2022: The right foot remains closed. The left wound has narrowed further. There is some slough and eschar accumulation. 08/25/2022: We put him in a peg assist shoe insert and as a result, he has more epithelialization of the ulcer with minimal slough and eschar accumulation. 09/01/2022: The wound is smaller by about half this week. Still with some slough on the surface. The peg assist shoe seems  [name] be doing a remarkable job of adequately offloading the site. 09/08/2022: There is a little bit more epithelium coming in. There is some slough and callus buildup. 09/16/2022: The wound measures about the same size, but the epithelium that has grown in looks more robust and stronger. There is some slough and callus buildup. 09/23/2022: The wound remains about the same size. The skin edges are looking rather senescent. 09/29/2022: The aggressive debridement I performed last week seems [name] have been effective. The wound is smaller and has significantly less slough accumulation. 10/06/2022: There has been quite a bit of epithelialization since last week. There are still some open areas with slough accumulation. There is some callus buildup around the perimeter. 10/13/2022: Continued improvement. Minimal callus accumulation. 3/14; patient presents for follow-up. He has been using endoform [name] the wound bed without issues. He is using a surgical shoe with peg assist for offloading. 10/27/2022: The wound dimensions are stable. There is some senescent skin accumulation around the perimeter. 11/04/2022: Last week I performed a very aggressive debridement in an effort [name] stimulate the healing cascade. As has been the case when I have done this before, the wound has responded well. There has been epithelialization and contraction of the wound. There are just a couple of small open areas. 11/10/2022: Unfortunately, his foot got wet secondary [name] sweating and there has been some breakdown of the tissue, particularly the skin just adjacent [name] the main ulcer. 11/18/2022: We continue [name] struggle with moisture-related tissue breakdown around the perimeter of his wound. This has caused the thin epithelium that had formed on the surface [name] disintegrate. The underlying surface of the wound has good granulation tissue. 11/25/2022: The edges of the wound are much less macerated, but the surface is actually looking a little bit dry.  There is slough accumulation. No obvious signs of infection. 12/01/2022: The wound edges are more macerated and broken down, making the wound larger. The surface has some slough on it. Electronic Signature(s) Signed: 12/01/2022 12:10:30 PM By: [name] MD FACS Entered By: [name] on 12/01/2022 12:10:30 [name] ([id number]) [id number] Page 6 of 16 -------------------------------------------------------------------------------- Physical Exam Details Patient Name: Date of Service: [name] [name] 12/01/2022 9:30 A M Medical Record Number: [id number] Patient Account Number: [id number] Date of Birth/Sex: Treating RN: 1976-04-11 (47 y.o. M) Primary Care Provider: Dorinda Hill Other Clinician: Referring Provider: Treating Provider/Extender: Jana Hakim in Treatment: 124 Constitutional . . . Marland Kitchen  no acute distress. Respiratory Normal work of breathing on room air. Notes 12/01/2022: The wound edges are more macerated and broken down, making the wound larger. The surface has some slough on it. Electronic Signature(s) Signed: 12/01/2022 12:11:32 PM By: [name] MD FACS Entered By: [name] on 12/01/2022 12:11:32 -------------------------------------------------------------------------------- Physician Orders Details Patient Name: Date of Service: [name], [name] 12/01/2022 9:30 A M Medical Record Number: [id number] Patient Account Number: [id number] Date of Birth/Sex: Treating RN: [birth year] (47 y.o. [name] Primary Care Provider: Dorinda Hill Other Clinician: Referring Provider: Treating Provider/Extender: Jana Hakim in Treatment: [phone] Verbal / Phone Orders: No Diagnosis Coding ICD-10 Coding Code Description [phone] Non-pressure chronic ulcer of other part of left foot with fat layer exposed Follow-up Appointments ppointment in 1 week. - Dr. Lady Gary Room 3 Return  A Anesthetic Wound #2 Left Calcaneus (In clinic) Topical Lidocaine 4% applied [name] wound bed - In clinic Bathing/ Shower/ Hygiene May shower and wash wound with soap and water. Edema Control - Lymphedema / SCD / Other Bilateral Lower Extremities Avoid standing for long periods of time. Moisturize legs daily. - as needed Off-Loading Other: - keep pressure off of the bottom of your feet. Elevate legs throughout the day. Use the Shoe with the PegAssist off-loading insole Additional Orders / Instructions Follow Nutritious Diet - Try [name] get 70-100 grams of Protein a day+ Wound Treatment Wound #2 - Calcaneus Wound Laterality: Left Cleanser: Normal Saline (Generic) Every Other Day/30 Days Discharge Instructions: Cleanse the wound with Normal Saline prior [name] applying a clean dressing using gauze sponges, not tissue or cotton balls. Cleanser: Wound Cleanser (Generic) Every Other Day/30 Days Discharge Instructions: Cleanse the wound with wound cleanser prior [name] applying a clean dressing using gauze sponges, not tissue or cotton balls. [name], TOMASELLO ([id number]) [id number] Page 7 of 16 Peri-Wound Care: Zinc Oxide Ointment 30g tube Every Other Day/30 Days Discharge Instructions: Apply Zinc Oxide [name] periwound with each dressing change Topical: Gentamicin Every Other Day/30 Days Discharge Instructions: hold Topical: compounding topical antibiotics from [facility] pharmacy Every Other Day/30 Days Discharge Instructions: HOLD Prim Dressing: Maxorb Extra Ag+ Alginate Dressing, 4x4.75 (in/in) Every Other Day/30 Days ary Discharge Instructions: Apply [name] wound bed as instructed Secondary Dressing: Drawtex 4x4 in Every Other Day/30 Days Discharge Instructions: Apply over primary dressing as directed. Secondary Dressing: Optifoam Non-Adhesive Dressing, 4x4 in (Generic) Every Other Day/30 Days Discharge Instructions: Apply over primary dressing as directed. Secondary Dressing:  Woven Gauze Sponge, Non-Sterile 4x4 in (Generic) Every Other Day/30 Days Discharge Instructions: Apply over primary dressing as directed. Secured With: 38M Medipore H Soft Cloth Surgical T ape, 4 x 10 (in/yd) (Generic) Every Other Day/30 Days Discharge Instructions: Secure with tape as directed. Compression Wrap: Kerlix Roll 4.5x3.1 (in/yd) Every Other Day/30 Days Discharge Instructions: Apply Kerlix and Coban compression as directed. Add-Ons: [employer] Offloading Boot, Size Regular Every Other Day/30 Days Electronic Signature(s) Signed: 12/01/2022 4:24:20 PM By: [name] MD FACS Entered By: [name] on 12/01/2022 12:15:08 -------------------------------------------------------------------------------- Problem List Details Patient Name: Date of Service: [name], [name] 12/01/2022 9:30 A M Medical Record Number: [id number] Patient Account Number: [id number] Date of Birth/Sex: Treating RN: [birth year] (47 y.o. M) Primary Care Provider: Dorinda Hill Other Clinician: Referring Provider: Treating Provider/Extender: Jana Hakim in Treatment: 124 Active Problems ICD-10 Encounter Code Description Active Date MDM Diagnosis L97.522 Non-pressure chronic ulcer of other part of left foot with fat layer exposed 09/03/2020 No Yes  Inactive Problems ICD-10 Code Description Active Date Inactive Date L97.512 Non-pressure chronic ulcer of other part of right foot with fat layer exposed 09/03/2020 09/03/2020 L89.893 Pressure ulcer of other site, stage 3 07/15/2020 07/15/2020 M62.81 Muscle weakness (generalized) 07/15/2020 07/15/2020 I10 Essential (primary) hypertension 07/15/2020 07/15/2020 M86.171 Other acute osteomyelitis, right ankle and foot 09/03/2020 09/03/2020 [name], [name] ([id number]) [id number] Page 8 of 16 Resolved Problems Electronic Signature(s) Signed: 12/01/2022 12:02:04 PM By: [name] MD FACS Entered By: [name] on 12/01/2022 12:02:03 -------------------------------------------------------------------------------- Progress Note Details Patient Name: Date of Service: [name], [name] [place] [name] 12/01/2022 9:30 A M Medical Record Number: [id number] Patient Account Number: [id number] Date of Birth/Sex: Treating RN: 20-Jun-1976 (47 y.o. M) Primary Care Provider: Dorinda Hill Other Clinician: Referring Provider: Treating Provider/Extender: Jana Hakim in Treatment: 124 Subjective Chief Complaint Information obtained from Patient Bilateral Plantar Foot Ulcers History of Present Illness (HPI) Wounds are12/03/2020 upon evaluation today patient presents for initial inspection here in our clinic concerning issues he has been having with the bottoms of his feet bilaterally. He states these actually occurred as wounds when he was hospitalized for 5 months secondary [name] Covid. He was apparently with tilting bed where he was in an upright position quite frequently and apparently this occurred in some way shape or form during that time. Fortunately there is no sign of active infection at this time. No fevers, chills, nausea, vomiting, or diarrhea. With that being said he still has substantial wounds on the plantar aspects of his feet Theragen require quite a bit of work [name] get these [name] heal. He has been using Santyl currently though that is been problematic both in receiving the medication as well as actually paid for it as it is become quite expensive. Prior [name] the experience with Covid the patient really did not have any major medical problems other than hypertension he does have some mild generalized weakness following the Covid experience. 07/22/2020 on evaluation today patient appears [name] be doing okay in regard [name] his foot ulcers I feel like the wound beds are showing signs of better improvement that I do believe the Iodoflex is helping in this regard. With that being said he does have a lot  of drainage currently and this is somewhat blue/green in nature which is consistent with Pseudomonas. I do think a culture today would be appropriate for [place] [name] evaluate and see if that is indeed the case I would likely start him on antibiotic orally as well he is not allergic [name] Cipro knows of no issues he has had in the past 12/21; patient was admitted [name] the clinic earlier this month with bilateral presumed pressure ulcers on the bottom of his feet apparently related [name] excessive pressure from a tilt table arrangement in the intensive care unit. Patient relates this [name] being on ECMO but I am not really sure that is exactly related [name] that. I must say I have never seen anything like this. He has fairly extensive full-thickness wounds extending from his heel towards his midfoot mostly centered laterally. There is already been some healing distally. He does not appear [name] have an arterial issue. He has been using gentamicin [name] the wound surfaces with Iodoflex [name] help with ongoing debridement 1/6; this is a patient with pressure ulcers on the bottom of his feet related [name] excessive pressure from a standing position in the intensive care unit. He is complaining of a lot of pain in the right heel. He is not  a diabetic. 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 We have been using Iodoflex [name] help prepare the surfaces of both wounds for an advanced treatment product. He is nonambulatory spending most of his time in a wheelchair I have asked him not [name] propel the wheelchair with his heels 1/13; in general his wounds look better not much surface area change we have been using Iodoflex as of last week. I did an x-ray of the right heel as the patient was complaining of pain. I had some thoughts about a stress fracture perhaps Achilles tendon problems however what it showed was erosive changes along the inferior aspect of the calcaneus he now has a MRI booked for 1/20. 1/20; in general his  wounds continue [name] be better. Some improvement in the large narrow areas proximally in his foot. He is still complaining of pain in the right heel and tenderness in certain areas of this wound. His MRI is tonight. I am not just looking for osteomyelitis that was brought up on the x-ray I am wondering about stress fractures, tendon ruptures etc. He has no such findings on the left. Also noteworthy is that the patient had critical illness neuropathy and some of the discomfort may be actual improvement in nerve function I am just not sure. These wounds were initially in the setting of severe critical illness related [name] COVID-19. He was put in a standing position. He may have also been on pressors at the point contributing [name] tissue ischemia. By his description at some point these wounds were grossly necrotic extending proximally up into the Achilles part of his heel. I do not know that I have ever really seen pictures of them like this although they may exist in epic We have ordered Tri layer Oasis. I am trying [name] stimulate some granulation in these areas. This is of course assuming the MRI is negative for infection 1/27; since the patient was last here he saw Dr. Earlene Plater of infectious disease. He is planned for vancomycin and ceftriaxone. Prior operative culture grew MSSA. Also ordered baseline lab work. He also ordered arterial studies although the ABIs in our clinic were normal as well as his clinical exam these were normal I do not think he needs [name] see vascular surgery. His ABIs at the [facility] were 1.22 in the right triphasic waveforms with a normal TBI of 1.15 on the left ABI of 1.22 with triphasic waveforms and a normal TBI of 1.08. Finally he saw Dr. Logan Bores who will follow him in for 2 months. At this point I do not think he felt that he needed a procedure on the right calcaneal bone. Dr. Earlene Plater is elected for broad-spectrum antibiotic The patient is still having pain in the right heel. He walks with  a walker 2/3; wounds are generally smaller. He is tolerating his IV antibiotics. I believe this is vancomycin and ceftriaxone. We are still waiting for Oasis burn in terms of his out-of-pocket max which he should be meeting soon given the IV antibiotics, MRIs etc. I have asked him [name] check in on this. We are using silver collagen in the meantime the wounds look better 2/10; tolerating IV vancomycin and Rocephin. We are waiting [name] apply for Oasis. Although I am not really sure where he is in his out-of-pocket max. 2/17 started the first application of Oasis trilayer. Still on antibiotics. The wounds have generally look better. The area on the left has a little more surface slough requiring debridement 2/24;  second application of Oasis trilayer. The wound surface granulation is generally look better. The area on the left with undermining laterally I think is come [name], [name] ([id number]) [id number] Page 9 of 16 in a bit. 10/08/2020 upon evaluation today patient is here today for [employer] application #3. Fortunately he seems [name] be doing extremely well with regard [name] this and we are seeing a lot of new epithelial growth which is great news. Fortunately there is no signs of active infection at this time. 10/16/2020 upon evaluation today patient appears [name] be doing well with regard [name] his foot ulcers. Do believe the Oasis has been of benefit for him. I do not see any signs of infection right now which is great news and I think that he has a lot of new epithelial growth which is great [name] see as well. The patient is very pleased [name] hear all of this. I do think we can proceed with the Oasis trilayer #4 today. 3/18; not as much improvement in these areas on his heels that I was hoping. I did reapply trilateral Oasis today the tissue looks healthier but not as much fill in as I was hoping. 3/25; better looking today I think this is come in a bit the tissue looks healthier.  Triple layer Oasis reapplied #6 4/1; somewhat better looking definitely better looking surface not as much change in surface area as I was hoping. He may be spending more time [place] on days then he needs [name] although he does have heel offloading boots. Triple layer Oasis reapplied #7 4/7; unfortunately apparently [facility] will not approve any further Oasis which is unfortunate since the patient did respond nicely both in terms of the condition of the wound bed as well as surface area. There is still some drainage coming from the wound but not a lot there does not appear [name] be any infection 4/15; we have been using Hydrofera Blue. He continues [name] have nice rims of epithelialization on the right greater than the left. The left the epithelialization is coming from the tip of his heel. There is moderate drainage. In this that concerns me about a total contact cast. There is no evidence of infection 4/29; patient has been using Hydrofera Blue with dressing changes. He has no complaints or issues today. 5/5; using Hydrofera Blue. I actually think that he looks marginally better than the last time I saw this 3 weeks ago. There are rims of epithelialization on the left thumb coming from the medial side on the right. Using Hydrofera Blue 5/12; using Hydrofera Blue. These continue [name] make improvements in surface area. His drainage was not listed as severe I therefore went ahead and put a cast on the left foot. Right foot we will continue [name] dress his previous 5/16; back for first total contact cast change. He did not tolerate this particularly well cast injury on the anterior tibia among other issues. Difficulty sleeping. I talked him about this in some detail and afterwards is elected [name] continue. I told him I would like [name] have a cast on for 3 weeks [name] see if this is going [name] help at all. I think he agreed 5/19; I think the wound is better. There is no tunneling towards his midfoot. The  undermining medially also looks better. He has a rim of new skin distally. I think we are making progress here. The area on the left also continues [name] look somewhat better [name] me using Hydrofera Blue.  He has a list of complaints about the cast but none of them seem serious 5/26; patient presents for 1 week follow-up. He has been using a total contact cast and tolerating this well. Hydrofera Blue is the main dressing used. He denies signs of infection. 6/2 Hydrofera Blue total contact cast on the left. These were large ulcers that formed in intensive care unit where the patient was recovering from COVID. May have had something to do with being ventilated in an upright positiono Pressors etc. We have been able [name] get the areas down considerably and a viable surface. There is some epithelialization in both sides. Note made of drainage 6/9; changed [name] [facility] last time because of drainage. He arrives with better looking surfaces and dimensions on the left than the right. Paradoxically the right actually probes more towards his midfoot the left is largely close down but both of these look improved. Using a total contact cast on the left 6/16; complex wounds on his bilateral plantar heels which were initially pressure injury from a stay in the ICU with COVID. We have been using silver alginate most recently. His dimensions of come in quite dramatically however not recently. We have been putting the left foot in a total contact cast 6/23; complex wounds on the bilateral plantar heels. I been putting the left in the cast paradoxically the area on the right is the one that is going towards closure at a faster rate. Quite a bit of drainage on the left. The patient went [name] see Dr. Logan Bores who said he was going [name] standby for skin grafts. I had actually considered sending him for skin grafts however he would be mandatorily off his feet for a period of weeks [name] months. I am thinking that the area on the right  is going [name] close on its own the area on the left has been more stubborn even though we have him in a total contact cast 6/30; took him out of a total contact cast last week is the right heel seem [name] be making better progress than the left where I was placing the cast. We are using silver alginate. Both wounds are smaller right greater than left 7/12; both wounds look as though they are making some progress. We are using silver alginate. Heel offloading boots 7/26; very gradual progress especially on the right. Using silver alginate. He is wearing heel offloading boots 8/18; he continues [name] close these wounds down very gradually. Using silver alginate. The problem polymen being definitive about this is areas of what appears [name] be callus around the margins. This is not a 100% of the area but certainly sizable especially on the right 9/1; bilateral plantar feet wounds secondary [name] prolonged pressure while being ventilated for COVID-19 in an upright position. Essentially pressure ulcers on the bottom of his feet. He is made substantial progress using silver alginate. 9/14; bilateral plantar feet wounds secondary [name] prolonged pressure. Making progress using silver alginate. 9/29 bilateral plantar feet wounds secondary [name] prolonged pressure. I changed him [name] Iodoflex last week. MolecuLight showing reddened blush fluorescence 10/11; patient presents for follow-up. He has no issues or complaints today. He denies signs of infection. He continues [name] use Iodoflex and antibiotic ointment [name] the wound beds. 10/27; 2-week follow-up. No evidence of infection. He has callus and thick dry skin around the wound margins we have been using Iodoflex and Bactroban which was in response [name] a moderate left MolecuLight reddish blush fluorescence. 11/10; 2-week follow-up. Wound  margins again have thick callus however the measurements of the actual wound sites are a lot smaller. Everything looks reasonably healthy here. We  have been using Iodoflex He was approved for prime matrix but I have elected [name] delay this given the improvement in the surface area. Hopefully I will not regret that decision as were getting close [name] the end of the year in terms of insurance payment 12/8; 2-week follow-up. Wounds are generally smaller in size. These were initially substantial wounds extending into the forefoot all the way into the heel on the bilateral plantar feet. They are now both located on the plantar heel distal aspect both of these have a lot of callus around the wounds I used a #5 curette [name] remove this on the right and the left also some subcutaneous debris [name] try and get the wound edges were using Iodoflex. He has heel offloading shoe 12/22; 2-week follow-up. Not really much improvement. He has thick callus around the outer edges of both wounds. I remove this there is some nonviable subcutaneous tissue as well. We have been using Iodoflex. Her intake nurse and myself spontaneously thought of a total contact cast I went back in May. At that time we really were not seeing much of an improvement with a cast although the wound was in a much different situation I would like [name] retry this in 2 weeks and I discussed this with the patient 08/12/2021; the patient has had some improvement with the Iodoflex. The the area on the left heel plantar more improved than the right. I had [name] put him in a total contact cast on the left although I decided [name] put that off for 2 weeks. I am going [name] change his primary dressing [name] silver collagen. I think in both areas he has had some improvement most of the healing seems [name] be more proximal in the heel. The wounds are in the mid aspect. A lot of thick callus on the right heel however. 1/19; we are using silver collagen on both plantar heel areas. He has had some improvement today. The left did not require any debridement. He still had some eschar on the right that was debrided but both seem [name] have  contracted. I did not put it total contact cast on him today 2/2 we have been using silver collagen. The area on the right plantar heel has areas that appear [name] be epithelialized interspersed with dry flaking callus and dry skin. I removed this. This really looks better than on the other side. On the left still a large area with raised edges and debris on the surface. 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 He has no issues or complaints today. 2/9; not much change in the left foot wound with 1 week of a cast we are using silver collagen. Silver collagen on the right side. The right side has been the better wound surface. We will reapply the total contact cast on the left 2/16; not much improvement on either side I been using silver collagen with a total contact cast on the left. I'm changing the Hydrofera Blue still with a total contact cast on the left 2/23; some improvement on both sides. Disappointing that he has thick callus around the area that we are putting in a total contact cast on the left. We've been using Hydrofera Blue on  both wound areas. [name], [name] ([id number]) [id number] Page 10 of 16 This is a man who at essentially pressure ulcers in addition [name] ischemia caused by medications [name] support his blood pressure (pressors) in the ICU. He was being ventilated in the standing position for severe Covid. A Shiley the wounds extended across his entire foot but are now localized [name] his plantar heels bilaterally. We have made progress however neither areas healed. I continue [name] think the total contact cast is helped albeit painstakingly slowly. He has never wanted a plastic surgery consult although I don't know that they would be interested in grafting in area in this location. 10/07/2021: Continued improvement bilaterally. There is  still some callus around the left wound, despite the total contact cast. He has some increased pain in his right midfoot around 1 particular area. This has been painful in the past but seems [name] be a little bit worse. When his cast was removed today, there was an area on the heel of the left foot that looks a bit macerated. He is also complaining of pain in his left thigh and hip which he thinks is secondary [name] the limb length discrepancy caused by the cast. 10/14/2021: He continues [name] improve. A little bit less callus around the left wound. He continues [name] endorse pain in his right midfoot, but this is not as significant as it was last week. The maceration on his left heel is improved. 10/21/2021: Continued improvement [name] both wounds. The maceration on his left heel is no longer evident. Less callus bilaterally. Epithelialization progressing. 10/28/2021: Significant improvement this week. The right sided wound is nearly closed with just a small open area at the middle. No maceration seen on the left heel. Continued epithelialization on both sides. No concern for infection. 11/04/2021: T oday, the wounds were measured a little bit differently and come out as larger, but I actually think they are about the same [name] potentially even smaller, particularly on the left. He continues [name] accumulate some callus on the right. 11/11/2021: T oday, the patient is expressing some concern that the left wound, despite being in the total contact cast, is not progressing at the same rate as the 1 on the right. He is interested in trying a week without the cast [name] see how the wound does. The wounds are roughly the same size as last week, with the right perhaps being a little bit smaller. He continues [name] build up callus on both sites. 11/18/2021: Last week, I permitted the patient [name] go without his total contact cast, just [name] see if the cast was really making any difference. Today, both wounds have deteriorated [name] some extent,  suggesting that the cast is providing benefit, at least on the left. Both are larger and have accumulated callus, slough, and other debris. 11/26/2021: I debrided both wounds quite aggressively last week in an effort [name] stimulate the healing cascade. This appears [name] have been effective as the left sided wound is a full centimeter shorter in length. Although the right was measured slightly larger, on inspection, it looks as though an area of epithelialized tissue was included in the measurements. We have been using PolyMem Ag on the wound surfaces with a total contact cast [name] the left. 12/02/2021: It appears that the intake personnel are including epithelialized tissue in his wound measurements; the right wound is almost completely epithelialized; there is just a crater at the proximal midfoot with some open areas. On the left, he has built up  some callus, but the overall wound surface looks good. There is some senescent skin around the wound margin. He has been in PolyMem Ag bilaterally with a total contact cast on the left. 12/09/2021: The right wound is nearly closed; there is just a small open area at the mid calcaneus. On the left, the wound is smaller with minimal callus buildup. No significant drainage. 12/16/2021: The right calcaneal wound remains minimally open at the mid calcaneus; the rest has epithelialized. On the left, the wound is also a little bit smaller. There is some senescent tissue on the periphery. He is getting his first application of a trial skin substitute called Vendaje today. 12/23/2021: The wound on his right calcaneus is nearly closed; there is just a small area at the most distal aspect of the calcaneus that is open. On the left, the area where we applied [name] the skin substitute has a healthier-looking bed of granulation tissue. The wound dimensions are not significantly different on this side but the wound surface is improved. 12/30/2021: The wound on the right calcaneus has not  changed significantly aside from some accumulation of callus. On the left, the open area is smaller and continues [name] have an improved surface. He continues [name] accumulate callus around the wound. He is here for his third application of Vendaje. 01/06/2022: The right calcaneal wound is down [name] just a couple of millimeters. He continues [name] accumulate periwound callus. He unfortunately got his cast wet earlier in the week and his left foot is macerated, resulting in some superficial skin loss just distal [name] the open wound. The open wound itself, however, is much smaller and has a healthier appearing surface. He is here for his fourth application of Vendaje. 01/13/2022: The right calcaneal wound is about the same. Unfortunately, once again, his cast got wet and his foot is again macerated. This is caused the left calcaneal wound [name] enlarge. He is here for his fifth application of Vendaje. 01/20/2022: The right calcaneal wound is very small. There is some periwound callus accumulation. He purchased a new cast protector last week and this has been effective in avoiding the maceration that has been occurring on the left. The left calcaneal wound is narrower and has a healthy and viable-appearing surface. He is here for his 6 application of Vendaje. 01/27/2022: The right calcaneal wound is down [name] just a pinhole. There is some periwound slough and callus. On the left, the wound is narrower and shorter by about a centimeter. The surface is robust and viable-appearing. Unfortunately, the rep for the trial skin substitute product did not provide any for [place] [name] use today. 02/04/2022: The right calcaneal wound remains unchanged. There is more accumulated callus. On the left, although the intake nurse measured it a little bit longer, it looks about the same [name] me. The surface has a layer of slough, but underneath this, there is good granulation tissue. 02/10/2022: The right calcaneus wound is nearly closed. There is still  some callus that builds up around the site. The left side looks about the same in terms of dimensions, but the surface is more robust and vital-appearing. 02/16/2022: The area of the right calcaneus that was nearly closed last week has closed, but there is a small opening at the mid foot where it looks like some moisture got retained and caused some reopening. The left foot wound is narrower and shallower. Both sites have a fair amount of periwound callus and eschar. 02/24/2022: The small midfoot opening on the  right calcaneus is a little bit smaller today. The left foot wound is narrower and shallower. He continues [name] accumulate periwound callus. No concern for infection. 03/01/2022: The patient came [name] clinic early because he showered and got his cast wet. Fortunately, there is no significant maceration [name] his foot but the callus softened and it looks like the wound on his left calcaneus may be a little bit wider. The wound on his right calcaneus is just a narrow slit. Continued accumulation of periwound callus bilaterally. 03/08/2022: The wound on his right calcaneus is very nearly closed, just a small pinpoint opening under a bit of eschar; the left wound has come in quite a bit, as well. It is narrower and shorter than at our last visit. Still with accumulated callus and eschar bilaterally. 03/17/2022: The right calcaneal wound is healed. The left wound is smaller and the surface itself is very clean, but there is some blue-green staining on the periwound callus, concerning for Pseudomonas aeruginosa. 03/23/2022: The right calcaneal wound remains closed. The left wound continues [name] contract. No further blue-green staining. Small amount of callus and slough accumulation. 03/28/2022: He came in early today because he had gotten his cast wet. On inspection, the wound itself did not get wet or macerated, just a little bit of the [name], [name] ([id number]) [id number] Page 11 of  16 forefoot. The wound itself is basically unchanged. 04/07/2022: The right foot wound remains closed. The left wound is the smallest that I have seen it [name] date. It is narrower and shorter. It still continues [name] accumulate slough on the surface. 04/15/2022: There is a band of epithelium now dividing the small left plantar foot wound in 2. There is still some slough on the surface. 04/21/2022: The wound continues [name] narrow. Just a little bit of slough on the surface. He seems [name] be responding well [name] endoform. 04/28/2022: Continued slow contraction of the wound. There is a little slough on the surface and some periwound callus. We have been using endoform and total contact cast. 05/05/2022: The wound appears [name] have stalled. There is slough and some periwound eschar/callus. No concern for infection, however. 05/12/2022: Unfortunately, his right foot has reopened. It is located at the most posterior aspect of his surgical incision. The area was noted [name] have drainage coming from it when his padding was removed today. Underneath some callus and senescent skin, there is an opening. No purulent drainage or malodor. On the left foot, the wound is again unchanged. There is some light blue staining on the callus, but no malodor or purulent drainage. 10/13; right and left heel remanence of extensive plantar foot wounds. These are better than I remember by quite a big margin however he is still left with wounds on the left plantar heel and the right plantar heel. Been using endoform bilaterally. A culture was done that showed apparently Pseudomonas but we are still waiting for the [facility] antibiotic [name] use gentamicin today. He is still very active by description I am not sure about the offloading of his noncasted right foot 10/20; both wounds right and left heel debrided not much change from last week. [name] Echevaria has arrived which is linezolid, gentamicin and ciprofloxacin we will use this with endoform. T contact  cast on the left otal 06/02/2022: Both wounds are smaller today. There is still a fair amount of callus buildup around the right foot ulcer. The left is more superficial and nearly flush with the surrounding tissues. Also with slough and eschar  buildup. 06/10/2022: The right sided wound appears [name] be nearly closed, if not completely so, although it is somewhat difficult [name] tell given the abnormal tissue and scarring in his foot. There is a fair amount of callus and crust accumulation. On the left, the wound looks about the same, again with callus and slough. He has an appointment next Thursday with Dr. Annamary Rummage in podiatry; I am hopeful that there may be some reconstructive options available for Mr. Hohmann. 06/16/2022: Both wounds have some eschar and callus accumulation. The right sided wound is extremely narrow and barely open; the left is narrower than last week. There is a little bit of slough. He has his appointment in podiatry later today. 06/23/2022: The patient met with Dr. Annamary Rummage last week and unfortunately, there are no reconstructive options that he believes would be helpful. He did order an MRI [name] evaluate for osteomyelitis and fortunately, none was seen. The left sided wound is a little bit shorter and narrower today. The right sided wound is about the same. There is callus and eschar accumulation bilaterally. 06/29/2022: Both feet have improved from last week. There is epithelium making a valiant effort [name] creep across the surface on the left. The right side looks like it got a little dry and the deep crevasse in his midfoot has cracked. Both have eschar and there is some slough on the left. 07/07/2022: Both feet have improved. There is epithelium completely covering the calcaneus on the right with just a small opening in the crevasse in his midfoot. On the left, the open area of tissue is smaller but he continues [name] build up callus/eschar and slough. 07/15/2022: The opening in the  midfoot on the right is about the same size, covered with eschar and a little bit of slough. The open portion of the left wound is narrower and shorter with a bit of slough buildup. He admits [name] being on his feet more than recommended. 12/14; as far as I can tell everything on the right foot is closed. There is some eschar I removed some of this I cannot identify any open wound here. As usual this will be a very vulnerable area going forward. On the left this looks really quite healthy. I was pleasantly surprised [name] see how good this looked. Wound is certainly smaller and there appears [name] be healthy epithelialization. He has been using Promogran on the right and endoform on the left. He has been offloading the right foot with a heel offloading boot and he has a running shoe on the right foot 08/04/2022: The right foot remains closed. He has a thick cushioned insole in his sneaker. The left sided wound is smaller with just some slough and eschar accumulation. He is wearing the heel off loader on this foot. 08/15/2022: The right foot remains closed. The left wound has narrowed further. There is some slough and eschar accumulation. 08/25/2022: We put him in a peg assist shoe insert and as a result, he has more epithelialization of the ulcer with minimal slough and eschar accumulation. 09/01/2022: The wound is smaller by about half this week. Still with some slough on the surface. The peg assist shoe seems [name] be doing a remarkable job of adequately offloading the site. 09/08/2022: There is a little bit more epithelium coming in. There is some slough and callus buildup. 09/16/2022: The wound measures about the same size, but the epithelium that has grown in looks more robust and stronger. There is some slough and callus buildup. 09/23/2022:  The wound remains about the same size. The skin edges are looking rather senescent. 09/29/2022: The aggressive debridement I performed last week seems [name] have been effective.  The wound is smaller and has significantly less slough accumulation. 10/06/2022: There has been quite a bit of epithelialization since last week. There are still some open areas with slough accumulation. There is some callus buildup around the perimeter. 10/13/2022: Continued improvement. Minimal callus accumulation. 3/14; patient presents for follow-up. He has been using endoform [name] the wound bed without issues. He is using a surgical shoe with peg assist for offloading. 10/27/2022: The wound dimensions are stable. There is some senescent skin accumulation around the perimeter. 11/04/2022: Last week I performed a very aggressive debridement in an effort [name] stimulate the healing cascade. As has been the case when I have done this before, the wound has responded well. There has been epithelialization and contraction of the wound. There are just a couple of small open areas. 11/10/2022: Unfortunately, his foot got wet secondary [name] sweating and there has been some breakdown of the tissue, particularly the skin just adjacent [name] the main ulcer. 11/18/2022: We continue [name] struggle with moisture-related tissue breakdown around the perimeter of his wound. This has caused the thin epithelium that had formed on the surface [name] disintegrate. The underlying surface of the wound has good granulation tissue. [name], [name] ([id number]) [id number] Page 12 of 16 11/25/2022: The edges of the wound are much less macerated, but the surface is actually looking a little bit dry. There is slough accumulation. No obvious signs of infection. 12/01/2022: The wound edges are more macerated and broken down, making the wound larger. The surface has some slough on it. Patient History Information obtained from Patient. Family History Cancer - Maternal Grandparents, Diabetes - Father,Paternal Grandparents, Heart Disease - Maternal Grandparents, Hypertension - Father,Paternal Grandparents, Lung Disease -  Siblings, No family history of Hereditary Spherocytosis, Kidney Disease, Seizures, Stroke, Thyroid Problems, Tuberculosis. Social History Never smoker, Marital Status - Married, Alcohol Use - Never, Drug Use - No History, Caffeine Use - Daily - tea, soda. Medical History Eyes Denies history of Cataracts, Glaucoma, Optic Neuritis Ear/Nose/Mouth/Throat Denies history of Chronic sinus problems/congestion, Middle ear problems Hematologic/Lymphatic Denies history of Anemia, Hemophilia, Human Immunodeficiency Virus, Lymphedema, Sickle Cell Disease Respiratory Patient has history of Asthma Denies history of Aspiration, Chronic Obstructive Pulmonary Disease (COPD), Pneumothorax, Sleep Apnea, Tuberculosis Cardiovascular Patient has history of Angina - with COVID, Hypertension Denies history of Arrhythmia, Congestive Heart Failure, Coronary Artery Disease, Deep Vein Thrombosis, Hypotension, Myocardial Infarction, Peripheral Arterial Disease, Peripheral Venous Disease, Phlebitis, Vasculitis Gastrointestinal Denies history of Cirrhosis , Colitis, Crohnoos, Hepatitis A, Hepatitis B, Hepatitis C Endocrine Denies history of Type I Diabetes, Type II Diabetes Genitourinary Denies history of End Stage Renal Disease Immunological Denies history of Lupus Erythematosus, Raynaudoos, Scleroderma Integumentary (Skin) Denies history of History of Burn Musculoskeletal Denies history of Gout, Rheumatoid Arthritis, Osteoarthritis, Osteomyelitis Neurologic Denies history of Dementia, Neuropathy, Quadriplegia, Paraplegia, Seizure Disorder Oncologic Denies history of Received Chemotherapy, Received Radiation Psychiatric Denies history of Anorexia/bulimia, Confinement Anxiety Hospitalization/Surgery History - COVID PNA 07/22/2019- 11/14/2019. - 03/27/2020 wound debridement/ skin graft. Medical A Surgical History Notes nd Constitutional Symptoms (General Health) COVID PNA 07/22/2019-11/14/2019 VENT ECMO, foot  drop left foot , Genitourinary kidney stone Psychiatric anxiety Objective Constitutional no acute distress. Vitals Time Taken: 9:52 AM, Height: 69 in, Weight: 280 lbs, BMI: 41.3, Temperature: 98.3 F, Pulse: 90 bpm, Respiratory Rate: 18 breaths/min, Blood Pressure: 128/68 mmHg. Respiratory Normal work  of breathing on room air. General Notes: 12/01/2022: The wound edges are more macerated and broken down, making the wound larger. The surface has some slough on it. Integumentary (Hair, Skin) Wound #2 status is Open. Original cause of wound was Pressure Injury. The date acquired was: 10/07/2019. The wound has been in treatment 124 weeks. The wound is located on the Left Calcaneus. The wound measures 5.5cm length x 2.4cm width x 0.3cm depth; 10.367cm^2 area and 3.11cm^3 volume. There is Fat Layer (Subcutaneous Tissue) exposed. There is no tunneling or undermining noted. There is a medium amount of serosanguineous drainage noted. The wound margin is thickened. There is medium (34-66%) red, pink granulation within the wound bed. There is a medium (34-66%) amount of necrotic tissue within the [name], [name] ([id number]) [id number] Page 13 of 16 wound bed including Eschar and Adherent Slough. The periwound skin appearance had no abnormalities noted for color. The periwound skin appearance exhibited: Callus, Scarring, Maceration. The periwound skin appearance did not exhibit: Dry/Scaly. Periwound temperature was noted as No Abnormality. Assessment Active Problems ICD-10 Non-pressure chronic ulcer of other part of left foot with fat layer exposed Procedures Wound #2 Pre-procedure diagnosis of Wound #2 is a Pressure Ulcer located on the Left Calcaneus . There was a Selective/Open Wound Skin/Epidermis Debridement with a total area of 10.36 sq cm performed by [name], MD. With the following instrument(s): Curette [name] remove Viable and Non-Viable  tissue/material. Material removed includes Callus, Skin: Dermis, and Skin: Epidermis after achieving pain control using Lidocaine 4% T opical Solution. No specimens were taken. A time out was conducted at 10:17, prior [name] the start of the procedure. A Minimum amount of bleeding was controlled with Pressure. The procedure was tolerated well with a pain level of 4 throughout and a pain level of 4 following the procedure. Post Debridement Measurements: 5.5cm length x 2.4cm width x 0.2cm depth; 2.073cm^3 volume. Post debridement Stage noted as Category/Stage III. Character of Wound/Ulcer Post Debridement requires further debridement. Post procedure Diagnosis Wound #2: Same as Pre-Procedure General Notes: Scribed for Dr. Lady Gary by Brenton Grills RN.Marland Kitchen Plan Follow-up Appointments: Return Appointment in 1 week. - Dr. Lady Gary Room 3 Anesthetic: Wound #2 Left Calcaneus: (In clinic) Topical Lidocaine 4% applied [name] wound bed - In clinic Bathing/ Shower/ Hygiene: May shower and wash wound with soap and water. Edema Control - Lymphedema / SCD / Other: Avoid standing for long periods of time. Moisturize legs daily. - as needed Off-Loading: Other: - keep pressure off of the bottom of your feet. Elevate legs throughout the day. Use the Shoe with the PegAssist off-loading insole Additional Orders / Instructions: Follow Nutritious Diet - Try [name] get 70-100 grams of Protein a day+ WOUND #2: - Calcaneus Wound Laterality: Left Cleanser: Normal Saline (Generic) Every Other Day/30 Days Discharge Instructions: Cleanse the wound with Normal Saline prior [name] applying a clean dressing using gauze sponges, not tissue or cotton balls. Cleanser: Wound Cleanser (Generic) Every Other Day/30 Days Discharge Instructions: Cleanse the wound with wound cleanser prior [name] applying a clean dressing using gauze sponges, not tissue or cotton balls. Peri-Wound Care: Zinc Oxide Ointment 30g tube Every Other Day/30 Days Discharge  Instructions: Apply Zinc Oxide [name] periwound with each dressing change Topical: Gentamicin Every Other Day/30 Days Discharge Instructions: hold Topical: compounding topical antibiotics from [facility] pharmacy Every Other Day/30 Days Discharge Instructions: HOLD Prim Dressing: Maxorb Extra Ag+ Alginate Dressing, 4x4.75 (in/in) Every Other Day/30 Days ary Discharge Instructions: Apply [name] wound bed as instructed Secondary  Dressing: Drawtex 4x4 in Every Other Day/30 Days Discharge Instructions: Apply over primary dressing as directed. Secondary Dressing: Optifoam Non-Adhesive Dressing, 4x4 in (Generic) Every Other Day/30 Days Discharge Instructions: Apply over primary dressing as directed. Secondary Dressing: Woven Gauze Sponge, Non-Sterile 4x4 in (Generic) Every Other Day/30 Days Discharge Instructions: Apply over primary dressing as directed. Secured With: 77M Medipore H Soft Cloth Surgical T ape, 4 x 10 (in/yd) (Generic) Every Other Day/30 Days Discharge Instructions: Secure with tape as directed. Com pression Wrap: Kerlix Roll 4.5x3.1 (in/yd) Every Other Day/30 Days Discharge Instructions: Apply Kerlix and Coban compression as directed. Add-Ons: [employer] Offloading Boot, Size Regular Every Other Day/30 Days 12/01/2022: The wound edges are more macerated and broken down, making the wound larger. The surface has some slough on it. I used a curette [name] debride slough and skin from the wound. We are losing ground due [name] the tissue maceration. I am going [name] put him in silver alginate with Drawtex backing and a Zetuvit pad. We will stop using the Peg Assist and switch [name] a Globoped heel offloading shoe. He has been showering normally and just changing the dressing when he showers, but I am going [name] ask him [name] use his cast protector in the shower, as well. Will also have them change the dressing daily. Follow-up in 1 week. [name], [name] ([id number]) [id number] Page 14  of 16 Electronic Signature(s) Signed: 12/01/2022 12:21:37 PM By: [name] MD FACS Entered By: [name] on 12/01/2022 12:21:36 -------------------------------------------------------------------------------- HxROS Details Patient Name: Date of Service: [name], [name] [place] [name] 12/01/2022 9:30 A M Medical Record Number: [id number] Patient Account Number: [id number] Date of Birth/Sex: Treating RN: Jul 01, 1976 (47 y.o. M) Primary Care Provider: Dorinda Hill Other Clinician: Referring Provider: Treating Provider/Extender: Jana Hakim in Treatment: 124 Information Obtained From Patient Constitutional Symptoms (General Health) Medical History: Past Medical History Notes: COVID PNA 07/22/2019-11/14/2019 VENT ECMO, foot drop left foot , Eyes Medical History: Negative for: Cataracts; Glaucoma; Optic Neuritis Ear/Nose/Mouth/Throat Medical History: Negative for: Chronic sinus problems/congestion; Middle ear problems Hematologic/Lymphatic Medical History: Negative for: Anemia; Hemophilia; Human Immunodeficiency Virus; Lymphedema; Sickle Cell Disease Respiratory Medical History: Positive for: Asthma Negative for: Aspiration; Chronic Obstructive Pulmonary Disease (COPD); Pneumothorax; Sleep Apnea; Tuberculosis Cardiovascular Medical History: Positive for: Angina - with COVID; Hypertension Negative for: Arrhythmia; Congestive Heart Failure; Coronary Artery Disease; Deep Vein Thrombosis; Hypotension; Myocardial Infarction; Peripheral Arterial Disease; Peripheral Venous Disease; Phlebitis; Vasculitis Gastrointestinal Medical History: Negative for: Cirrhosis ; Colitis; Crohns; Hepatitis A; Hepatitis B; Hepatitis C Endocrine Medical History: Negative for: Type I Diabetes; Type II Diabetes Genitourinary Medical History: Negative for: End Stage Renal Disease Past Medical History Notes: kidney stone Immunological Medical History: Negative for: Lupus  Erythematosus; Raynauds; Scleroderma Integumentary (Skin) WILLARD, FARQUHARSON ([id number]) [id number] Page 15 of 16 Medical History: Negative for: History of Burn Musculoskeletal Medical History: Negative for: Gout; Rheumatoid Arthritis; Osteoarthritis; Osteomyelitis Neurologic Medical History: Negative for: Dementia; Neuropathy; Quadriplegia; Paraplegia; Seizure Disorder Oncologic Medical History: Negative for: Received Chemotherapy; Received Radiation Psychiatric Medical History: Negative for: Anorexia/bulimia; Confinement Anxiety Past Medical History Notes: anxiety Immunizations Pneumococcal Vaccine: Received Pneumococcal Vaccination: No Implantable Devices None Hospitalization / Surgery History Type of Hospitalization/Surgery COVID PNA 07/22/2019- 11/14/2019 03/27/2020 wound debridement/ skin graft Family and Social History Cancer: Yes - Maternal Grandparents; Diabetes: Yes - Father,Paternal Grandparents; Heart Disease: Yes - Maternal Grandparents; Hereditary Spherocytosis: No; Hypertension: Yes - Father,Paternal Grandparents; Kidney Disease: No; Lung Disease: Yes - Siblings; Seizures: No; Stroke: No; Thyroid Problems: No;  Tuberculosis: No; Never smoker; Marital Status - Married; Alcohol Use: Never; Drug Use: No History; Caffeine Use: Daily - tea, soda; Financial Concerns: No; Food, Clothing or Shelter Needs: No; Support System Lacking: No; Transportation Concerns: No Psychologist, prison and probation services) Signed: 12/01/2022 4:24:20 PM By: [name] MD FACS Entered By: [name] on 12/01/2022 12:11:02 -------------------------------------------------------------------------------- SuperBill Details Patient Name: Date of Service: [name], [name] 12/01/2022 Medical Record Number: [id number] Patient Account Number: [id number] Date of Birth/Sex: Treating RN: Dec 22, 1975 (46 y.o. [name] Primary Care Provider: Dorinda Hill Other  Clinician: Referring Provider: Treating Provider/Extender: Jana Hakim in Treatment: 124 Diagnosis Coding ICD-10 Codes Code Description [id number] Non-pressure chronic ulcer of other part of left foot with fat layer exposed Facility Procedures Physician Procedures : CPT4 Code Description Modifier [id number] - WC PHYS LEVEL 3 - EST PT 25 ICD-10 Diagnosis Description L97.522 Non-pressure chronic ulcer of other part of left foot with fat layer exposed Quantity: 1 : [id number] - WC PHYS DEBR WO ANESTH 20 SQ CM ICD-10 Diagnosis Description L97.522 Non-pressure chronic ulcer of other part of left foot with fat layer exposed Quantity: 1 Electronic Signature(s) Signed: 12/01/2022 12:23:03 PM By: [name] MD FACS Entered By: [name] on 12/01/2022 12:23:03

## 2022-12-06 NOTE — Progress Notes (Signed)
KIAN, OTTAVIANO ([id number]) [id number] Page 1 of 7 Visit Report for 12/01/2022 Arrival Information Details Patient Name: Date of Service: [name], TO [name] 12/01/2022 9:30 A M Medical Record Number: [id number] Patient Account Number: [id number] Date of Birth/Sex: Treating RN: 08-Feb-1976 (47 y.o. [name] Primary Care Leotis Isham: Dorinda Hill Other Clinician: Referring Rondarius Kadrmas: Treating Gracen Ringwald/Extender: Jana Hakim in Treatment: 124 Visit Information History Since Last Visit All ordered tests and consults were completed: Yes Patient Arrived: Wheel Chair Added or deleted any medications: No Arrival Time: 09:51 Any new allergies or adverse reactions: No Accompanied By: wife Had a fall or experienced change in No Transfer Assistance: None activities of daily living that may affect Patient Identification Verified: Yes risk of falls: Secondary Verification Process Completed: Yes Signs or symptoms of abuse/neglect since last visito No Patient Requires Transmission-Based Precautions: No Hospitalized since last visit: No Patient Has Alerts: No Implantable device outside of the clinic excluding No cellular tissue based products placed in the center since last visit: Has Dressing in Place as Prescribed: Yes Pain Present Now: Yes Electronic Signature(s) Signed: 12/05/2022 4:01:24 PM By: Brenton Grills Entered By: Brenton Grills on 12/01/2022 09:52:27 -------------------------------------------------------------------------------- Encounter Discharge Information Details Patient Name: Date of Service: [name], [name] 12/01/2022 9:30 A M Medical Record Number: [id number] Patient Account Number: [id number] Date of Birth/Sex: Treating RN: 05-23-1976 (47 y.o. [name] Primary Care Lacinda Curvin: Dorinda Hill Other Clinician: Referring Ralynn San: Treating Laycee Fitzsimmons/Extender: Jana Hakim in Treatment:  124 Encounter Discharge Information Items Post Procedure Vitals Discharge Condition: Stable Temperature (F): 98.0 Ambulatory Status: Wheelchair Pulse (bpm): 80 Discharge Destination: Home Respiratory Rate (breaths/min): 18 Transportation: Private Auto Blood Pressure (mmHg): 138/72 Accompanied By: wife Schedule Follow-up Appointment: Yes Clinical Summary of Care: Patient Declined Electronic Signature(s) Signed: 12/05/2022 4:01:24 PM By: Brenton Grills Entered By: Brenton Grills on 12/01/2022 10:52:58 -------------------------------------------------------------------------------- Lower Extremity Assessment Details Patient Name: Date of Service: [name], TO [name] 12/01/2022 9:30 A M Medical Record Number: [id number] Patient Account Number: [id number] Date of Birth/Sex: Treating RN: 1976/06/06 (47 y.o. [name] Primary Care Leshae Mcclay: Dorinda Hill Other Clinician: Referring Adrienne Delay: Treating Tahirah Sara/Extender: Jana Hakim in Treatment: 124 Edema Assessment G[Left: Jeanice Lim ([id number])] [Right: [id number] Page 2 of 7] Assessed: [Left: No] [Right: No] Edema: [Left: N] [Right: o] Calf Left: Right: Point of Measurement: 29 cm From Medial Instep 44.3 cm Ankle Left: Right: Point of Measurement: 9 cm From Medial Instep 24.4 cm Vascular Assessment Pulses: Dorsalis Pedis Palpable: [Left:Yes] Electronic Signature(s) Signed: 12/05/2022 4:01:24 PM By: Brenton Grills Entered By: Brenton Grills on 12/01/2022 10:02:58 -------------------------------------------------------------------------------- Multi Wound Chart Details Patient Name: Date of Service: [name], [name] 12/01/2022 9:30 A M Medical Record Number: [id number] Patient Account Number: [id number] Date of Birth/Sex: Treating RN: 20-Jan-1976 (47 y.o. M) Primary Care Rockney Grenz: Dorinda Hill Other Clinician: Referring Jeanna Giuffre: Treating Demetrus Pavao/Extender: Jana Hakim in Treatment: 124 Vital Signs Height(in): 69 Pulse(bpm): 90 Weight(lbs): 280 Blood Pressure(mmHg): 128/68 Body Mass Index(BMI): 41.3 Temperature(F): 98.3 Respiratory Rate(breaths/min): 18 [2:Photos:] [N/A:N/A] Left Calcaneus N/A N/A Wound Location: Pressure Injury N/A N/A Wounding Event: Pressure Ulcer N/A N/A Primary Etiology: Asthma, Angina, Hypertension N/A N/A Comorbid History: 10/07/2019 N/A N/A Date Acquired: 124 N/A N/A Weeks of Treatment: Open N/A N/A Wound Status: No N/A N/A Wound Recurrence: 5.5x2.4x0.3 N/A N/A Measurements L x W x D (cm) 10.367 N/A N/A A (cm) : rea 3.11 N/A N/A Volume (cm) :  61.70% N/A N/A % Reduction in A rea: 88.50% N/A N/A % Reduction in Volume: Category/Stage III N/A N/A Classification: Medium N/A N/A Exudate A mount: Serosanguineous N/A N/A Exudate Type: red, brown N/A N/A Exudate Color: Thickened N/A N/A Wound Margin: Medium (34-66%) N/A N/A Granulation A mount: Red, Pink N/A N/A Granulation Quality: Medium (34-66%) N/A N/A Necrotic A mount: Eschar, Adherent Slough N/A N/A Necrotic Tissue: Fat Layer (Subcutaneous Tissue): Yes N/A N/A Exposed Structures: Fascia: No JAQUE, DACY ([id number]) [id number] Page 3 of 7 Tendon: No Muscle: No Joint: No Bone: No Medium (34-66%) N/A N/A Epithelialization: Debridement - Selective/Open Wound N/A N/A Debridement: Pre-procedure Verification/Time Out 10:17 N/A N/A Taken: Lidocaine 4% Topical Solution N/A N/A Pain Control: Callus N/A N/A Tissue Debrided: Skin/Epidermis N/A N/A Level: 10.36 N/A N/A Debridement A (sq cm): rea Curette N/A N/A Instrument: Minimum N/A N/A Bleeding: Pressure N/A N/A Hemostasis A chieved: 4 N/A N/A Procedural Pain: 4 N/A N/A Post Procedural Pain: Procedure was tolerated well N/A N/A Debridement Treatment Response: 5.5x2.4x0.2 N/A N/A Post Debridement Measurements L x W x D  (cm) 2.073 N/A N/A Post Debridement Volume: (cm) Category/Stage III N/A N/A Post Debridement Stage: Callus: Yes N/A N/A Periwound Skin Texture: Scarring: Yes Maceration: Yes N/A N/A Periwound Skin Moisture: Dry/Scaly: No No Abnormalities Noted N/A N/A Periwound Skin Color: No Abnormality N/A N/A Temperature: Debridement N/A N/A Procedures Performed: Treatment Notes Wound #2 (Calcaneus) Wound Laterality: Left Cleanser Normal Saline Discharge Instruction: Cleanse the wound with Normal Saline prior to applying a clean dressing using gauze sponges, not tissue or cotton balls. Wound Cleanser Discharge Instruction: Cleanse the wound with wound cleanser prior to applying a clean dressing using gauze sponges, not tissue or cotton balls. Peri-Wound Care Zinc Oxide Ointment 30g tube Discharge Instruction: Apply Zinc Oxide to periwound with each dressing change Topical Gentamicin Discharge Instruction: hold compounding topical antibiotics from [facility] Discharge Instruction: HOLD Primary Dressing Maxorb Extra Ag+ Alginate Dressing, 4x4.75 (in/in) Discharge Instruction: Apply to wound bed as instructed Secondary Dressing Drawtex 4x4 in Discharge Instruction: Apply over primary dressing as directed. Optifoam Non-Adhesive Dressing, 4x4 in Discharge Instruction: Apply over primary dressing as directed. Woven Gauze Sponge, Non-Sterile 4x4 in Discharge Instruction: Apply over primary dressing as directed. Secured With 27M Medipore H Soft Cloth Surgical T ape, 4 x 10 (in/yd) Discharge Instruction: Secure with tape as directed. Compression Wrap Kerlix Roll 4.5x3.1 (in/yd) Discharge Instruction: Apply Kerlix and Coban compression as directed. Compression Stockings Add-Ons [place] Vascular Offloading Boot, Size Regular LUCIAN, BASWELL ([id number]) [id number] Page 4 of 7 Electronic Signature(s) Signed: 12/01/2022 12:02:12 PM By: [name] MD  FACS Entered By: [name] on 12/01/2022 12:02:12 -------------------------------------------------------------------------------- Multi-Disciplinary Care Plan Details Patient Name: Date of Service: [name], TO [name] 12/01/2022 9:30 A M Medical Record Number: [id number] Patient Account Number: [id number] Date of Birth/Sex: Treating RN: 25-Oct-1975 (47 y.o. [name] Primary Care Keniel Ralston: Dorinda Hill Other Clinician: Referring Takyra Cantrall: Treating Cobin Cadavid/Extender: Jana Hakim in Treatment: 124 Multidisciplinary Care Plan reviewed with physician Active Inactive Wound/Skin Impairment Nursing Diagnoses: Impaired tissue integrity Knowledge deficit related to ulceration/compromised skin integrity Goals: Patient/caregiver will verbalize understanding of skin care regimen Date Initiated: 07/15/2020 Target Resolution Date: 05/08/2023 Goal Status: Active Ulcer/skin breakdown will have a volume reduction of 30% by week 4 Date Initiated: 07/15/2020 Date Inactivated: 08/20/2020 Target Resolution Date: 09/03/2020 Goal Status: Unmet Unmet Reason: no major changes. Ulcer/skin breakdown will heal within 14 weeks Date Initiated: 12/04/2020 Date Inactivated: 12/10/2020 Target Resolution  Date: 12/10/2020 Unmet Reason: wounds still open at 14 Goal Status: Unmet weeks and today 21 weeks. Interventions: Assess patient/caregiver ability to obtain necessary supplies Assess patient/caregiver ability to perform ulcer/skin care regimen upon admission and as needed Assess ulceration(s) every visit Provide education on ulcer and skin care Treatment Activities: Skin care regimen initiated : 07/15/2020 Topical wound management initiated : 07/15/2020 Notes: Electronic Signature(s) Signed: 12/05/2022 4:01:24 PM By: Brenton Grills Entered By: Brenton Grills on 12/01/2022 10:46:32 -------------------------------------------------------------------------------- Pain Assessment  Details Patient Name: Date of Service: [name], TO [name] 12/01/2022 9:30 A M Medical Record Number: [id number] Patient Account Number: [id number] Date of Birth/Sex: Treating RN: 31-Oct-1975 (47 y.o. [name] Primary Care Merri Dimaano: Dorinda Hill Other Clinician: Referring Dionysios Massman: Treating Mescal Flinchbaugh/Extender: Jana Hakim in Treatment: 124 Active Problems Location of Pain Severity and Description of Pain Patient Has Paino Yes Site Locations Pain Location: ARAFAT, COCUZZA ([id number]) [id number] Page 5 of 7 Pain Location: Generalized Pain Duration of the Pain. Constant / Intermittento Constant Rate the pain. Current Pain Level: 4 Pain Management and Medication Current Pain Management: Electronic Signature(s) Signed: 12/05/2022 4:01:24 PM By: Brenton Grills Entered By: Brenton Grills on 12/01/2022 09:57:18 -------------------------------------------------------------------------------- Patient/Caregiver Education Details Patient Name: Date of Service: [name], TO [place] 4/25/2024andnbsp9:30 A M Medical Record Number: [id number] Patient Account Number: [id number] Date of Birth/Gender: Treating RN: 16-Jul-1976 (47 y.o. [name] Primary Care Physician: Dorinda Hill Other Clinician: Referring Physician: Treating Physician/Extender: Jana Hakim in Treatment: 124 Education Assessment Education Provided To: Patient and Caregiver Education Topics Provided Wound/Skin Impairment: Methods: Explain/Verbal Responses: State content correctly Electronic Signature(s) Signed: 12/05/2022 4:01:24 PM By: Brenton Grills Entered By: Brenton Grills on 12/01/2022 10:46:55 -------------------------------------------------------------------------------- Wound Assessment Details Patient Name: Date of Service: [name], TO [name] 12/01/2022 9:30 A M Medical Record Number: [id number] Patient Account Number:  [id number] Date of Birth/Sex: Treating RN: 09-Feb-1976 (47 y.o. [name] Primary Care Anniece Bleiler: Dorinda Hill Other Clinician: Referring Erasmus Bistline: Treating Seanpaul Preece/Extender: Jana Hakim in Treatment: 124 Wound Status Wound Number: 2 Primary Etiology: Pressure Ulcer Wound Location: Left Calcaneus Wound Status: Open Wounding Event: Pressure Injury Comorbid History: Asthma, Angina, Hypertension Date Acquired: 10/07/2019 Weeks Of Treatment: 124 Clustered Wound: No SAHIR, TOLSON ([id number]) [id number] Page 6 of 7 Photos Wound Measurements Length: (cm) 5.5 Width: (cm) 2.4 Depth: (cm) 0.3 Area: (cm) 10.367 Volume: (cm) 3.11 % Reduction in Area: 61.7% % Reduction in Volume: 88.5% Epithelialization: Medium (34-66%) Tunneling: No Undermining: No Wound Description Classification: Category/Stage III Wound Margin: Thickened Exudate Amount: Medium Exudate Type: Serosanguineous Exudate Color: red, brown Foul Odor After Cleansing: No Slough/Fibrino Yes Wound Bed Granulation Amount: Medium (34-66%) Exposed Structure Granulation Quality: Red, Pink Fascia Exposed: No Necrotic Amount: Medium (34-66%) Fat Layer (Subcutaneous Tissue) Exposed: Yes Necrotic Quality: Eschar, Adherent Slough Tendon Exposed: No Muscle Exposed: No Joint Exposed: No Bone Exposed: No Periwound Skin Texture Texture Color No Abnormalities Noted: No No Abnormalities Noted: Yes Callus: Yes Temperature / Pain Scarring: Yes Temperature: No Abnormality Moisture No Abnormalities Noted: No Dry / Scaly: No Maceration: Yes Treatment Notes Wound #2 (Calcaneus) Wound Laterality: Left Cleanser Normal Saline Discharge Instruction: Cleanse the wound with Normal Saline prior to applying a clean dressing using gauze sponges, not tissue or cotton balls. Wound Cleanser Discharge Instruction: Cleanse the wound with wound cleanser prior to applying a clean  dressing using gauze sponges, not tissue or cotton balls. Peri-Wound Care Zinc Oxide Ointment 30g tube Discharge Instruction:  Apply Zinc Oxide to periwound with each dressing change Topical Gentamicin Discharge Instruction: hold compounding topical antibiotics from [facility] pharmacy Discharge Instruction: HOLD Primary Dressing Maxorb Extra Ag+ Alginate Dressing, 4x4.75 (in/in) Discharge Instruction: Apply to wound bed as instructed LAWTON, DOLLINGER ([id number]) [id number] Page 7 of 7 Secondary Dressing Drawtex 4x4 in Discharge Instruction: Apply over primary dressing as directed. Optifoam Non-Adhesive Dressing, 4x4 in Discharge Instruction: Apply over primary dressing as directed. Woven Gauze Sponge, Non-Sterile 4x4 in Discharge Instruction: Apply over primary dressing as directed. Secured With 84M Medipore H Soft Cloth Surgical T ape, 4 x 10 (in/yd) Discharge Instruction: Secure with tape as directed. Compression Wrap Kerlix Roll 4.5x3.1 (in/yd) Discharge Instruction: Apply Kerlix and Coban compression as directed. Compression Stockings Add-Ons [place] Vascular Offloading Boot, Size Regular Electronic Signature(s) Signed: 12/05/2022 4:01:24 PM By: Brenton Grills Entered By: Brenton Grills on 12/01/2022 10:09:15 -------------------------------------------------------------------------------- Vitals Details Patient Name: Date of Service: [name], [name] 12/01/2022 9:30 A M Medical Record Number: [id number] Patient Account Number: [id number] Date of Birth/Sex: Treating RN: [birth year] (47 y.o. [name] Primary Care Elisheba Mcdonnell: Dorinda Hill Other Clinician: Referring Amadeus Oyama: Treating Donyell Ding/Extender: Jana Hakim in Treatment: 124 Vital Signs Time Taken: 09:52 Temperature (F): 98.3 Height (in): 69 Pulse (bpm): 90 Weight (lbs): 280 Respiratory Rate (breaths/min): 18 Body Mass Index (BMI): 41.3 Blood Pressure (mmHg):  128/68 Reference Range: 80 - 120 mg / dl Electronic Signature(s) Signed: 12/05/2022 4:01:24 PM By: Brenton Grills Entered By: Brenton Grills on 12/01/2022 09:56:56

## 2022-12-09 ENCOUNTER — Encounter (HOSPITAL_BASED_OUTPATIENT_CLINIC_OR_DEPARTMENT_OTHER): Payer: PPO | Attending: General Surgery | Admitting: General Surgery

## 2022-12-09 DIAGNOSIS — L97522 Non-pressure chronic ulcer of other part of left foot with fat layer exposed: Secondary | ICD-10-CM | POA: Diagnosis not present

## 2022-12-09 DIAGNOSIS — J45909 Unspecified asthma, uncomplicated: Secondary | ICD-10-CM | POA: Insufficient documentation

## 2022-12-09 DIAGNOSIS — L89623 Pressure ulcer of left heel, stage 3: Secondary | ICD-10-CM | POA: Diagnosis not present

## 2022-12-09 DIAGNOSIS — I10 Essential (primary) hypertension: Secondary | ICD-10-CM | POA: Insufficient documentation

## 2022-12-10 NOTE — Progress Notes (Signed)
[name], [name] ([id number]) [id number] Page 1 of 9 Visit Report for 12/09/2022 Arrival Information Details Patient Name: Date of Service: [name], TO [name] 12/09/2022 9:45 A M Medical Record Number: [id number] Patient Account Number: [id number] Date of Birth/Sex: Treating RN: 20-Jan-1976 (47 y.o. M) Primary Care Taro Hidrogo: Dorinda Hill Other Clinician: Referring Yovana Scogin: Treating Alando Colleran/Extender: Jana Hakim in Treatment: 125 Visit Information History Since Last Visit All ordered tests and consults were completed: No Patient Arrived: Wheel Chair Added or deleted any medications: No Arrival Time: 09:46 Any new allergies or adverse reactions: No Accompanied By: wife Had a fall or experienced change in No Transfer Assistance: None activities of daily living that may affect Patient Identification Verified: Yes risk of falls: Secondary Verification Process Completed: Yes Signs or symptoms of abuse/neglect since last visito No Patient Requires Transmission-Based Precautions: No Hospitalized since last visit: No Patient Has Alerts: No Implantable device outside of the clinic excluding No cellular tissue based products placed in the center since last visit: Pain Present Now: No Electronic Signature(s) Signed: 12/09/2022 2:37:02 PM By: Dayton Scrape Entered By: Dayton Scrape on 12/09/2022 09:47:41 -------------------------------------------------------------------------------- Clinic Level of Care Assessment Details Patient Name: Date of Service: [name] [name] 12/09/2022 9:45 A M Medical Record Number: [id number] Patient Account Number: [id number] Date of Birth/Sex: Treating RN: 1976-04-23 (47 y.o. [name] Primary Care Rickardo Brinegar: Dorinda Hill Other Clinician: Referring Tamari Busic: Treating Leydi Winstead/Extender: Jana Hakim in Treatment: 125 Clinic Level of Care Assessment Items TOOL 4 Quantity Score X- 1  0 Use when only an EandM is performed on FOLLOW-UP visit ASSESSMENTS - Nursing Assessment / Reassessment X- 1 10 Reassessment of Co-morbidities (includes updates in patient status) X- 1 5 Reassessment of Adherence to Treatment Plan ASSESSMENTS - Wound and Skin A ssessment / Reassessment X - Simple Wound Assessment / Reassessment - one wound 1 5 []  - 0 Complex Wound Assessment / Reassessment - multiple wounds []  - 0 Dermatologic / Skin Assessment (not related to wound area) ASSESSMENTS - Focused Assessment []  - 0 Circumferential Edema Measurements - multi extremities []  - 0 Nutritional Assessment / Counseling / Intervention []  - 0 Lower Extremity Assessment (monofilament, tuning fork, pulses) []  - 0 Peripheral Arterial Disease Assessment (using hand held doppler) ASSESSMENTS - Ostomy and/or Continence Assessment and Care []  - 0 Incontinence Assessment and Management []  - 0 Ostomy Care Assessment and Management (repouching, etc.) PROCESS - Coordination of Care X - Simple Patient / Family Education for ongoing care 1 15 GISCARD, NOUN ([id number]) [id number] Page 2 of 9 []  - 0 Complex (extensive) Patient / Family Education for ongoing care X- 1 10 Staff obtains Consents, Records, T Results / Process Orders est X- 1 10 Staff telephones HHA, Nursing Homes / Clarify orders / etc []  - 0 Routine Transfer to another Facility (non-emergent condition) []  - 0 Routine Hospital Admission (non-emergent condition) []  - 0 New Admissions / Manufacturing engineer / Ordering NPWT Apligraf, etc. , []  - 0 Emergency Hospital Admission (emergent condition) X- 1 10 Simple Discharge Coordination []  - 0 Complex (extensive) Discharge Coordination PROCESS - Special Needs []  - 0 Pediatric / Minor Patient Management []  - 0 Isolation Patient Management []  - 0 Hearing / Language / Visual special needs []  - 0 Assessment of Community assistance (transportation, D/C  planning, etc.) []  - 0 Additional assistance / Altered mentation []  - 0 Support Surface(s) Assessment (bed, cushion, seat, etc.) INTERVENTIONS - Wound Cleansing / Measurement X - Simple  Wound Cleansing - one wound 1 5 []  - 0 Complex Wound Cleansing - multiple wounds X- 1 5 Wound Imaging (photographs - any number of wounds) []  - 0 Wound Tracing (instead of photographs) X- 1 5 Simple Wound Measurement - one wound []  - 0 Complex Wound Measurement - multiple wounds INTERVENTIONS - Wound Dressings X - Small Wound Dressing one or multiple wounds 1 10 []  - 0 Medium Wound Dressing one or multiple wounds []  - 0 Large Wound Dressing one or multiple wounds []  - 0 Application of Medications - topical []  - 0 Application of Medications - injection INTERVENTIONS - Miscellaneous []  - 0 External ear exam []  - 0 Specimen Collection (cultures, biopsies, blood, body fluids, etc.) []  - 0 Specimen(s) / Culture(s) sent or taken to Lab for analysis []  - 0 Patient Transfer (multiple staff / Nurse, adult / Similar devices) []  - 0 Simple Staple / Suture removal (25 or less) []  - 0 Complex Staple / Suture removal (26 or more) []  - 0 Hypo / Hyperglycemic Management (close monitor of Blood Glucose) []  - 0 Ankle / Brachial Index (ABI) - do not check if billed separately X- 1 5 Vital Signs Has the patient been seen at the hospital within the last three years: Yes Total Score: 95 Level Of Care: New/Established - Level 3 Electronic Signature(s) Signed: 12/09/2022 1:26:16 PM By: [name] RN Entered By: [name] on 12/09/2022 13:00:14 [name] ([id number]) [id number] Page 3 of 9 -------------------------------------------------------------------------------- Encounter Discharge Information Details Patient Name: Date of Service: [name] [place] 12/09/2022 9:45 A M Medical Record Number: [id number] Patient Account Number: [id number] Date of Birth/Sex:  Treating RN: Oct 03, 1975 (47 y.o. [name] Primary Care Lakishia Bourassa: Dorinda Hill Other Clinician: Referring Renada Cronin: Treating Yoshiharu Brassell/Extender: Jana Hakim in Treatment: 125 Encounter Discharge Information Items Post Procedure Vitals Discharge Condition: Stable Temperature (F): 98 Ambulatory Status: Wheelchair Pulse (bpm): 76 Discharge Destination: Home Respiratory Rate (breaths/min): 20 Transportation: Private Auto Blood Pressure (mmHg): 154/75 Accompanied By: spouse Schedule Follow-up Appointment: Yes Clinical Summary of Care: Patient Declined Electronic Signature(s) Signed: 12/09/2022 1:26:16 PM By: [name] RN Entered By: [name] on 12/09/2022 13:02:17 -------------------------------------------------------------------------------- Lower Extremity Assessment Details Patient Name: Date of Service: [name], TO [name] 12/09/2022 9:45 A M Medical Record Number: [id number] Patient Account Number: [id number] Date of Birth/Sex: Treating RN: August 12, 1975 (47 y.o. [name] Primary Care Devonda Pequignot: Dorinda Hill Other Clinician: Referring Phillp Dolores: Treating Jakylan Ron/Extender: Jana Hakim in Treatment: 125 Edema Assessment Assessed: [name]: No] Franne Forts: No] Edema: [Left: N] [Right: o] Calf Left: Right: Point of Measurement: 29 cm From Medial Instep 44.1 cm Ankle Left: Right: Point of Measurement: 9 cm From Medial Instep 24.2 cm Vascular Assessment Pulses: Dorsalis Pedis Palpable: [Left:Yes] Electronic Signature(s) Signed: 12/09/2022 1:26:16 PM By: [name] RN Entered By: [name] on 12/09/2022 10:02:00 -------------------------------------------------------------------------------- Multi Wound Chart Details Patient Name: Date of Service: [name], [name] 12/09/2022 9:45 A M Medical Record Number: [id number] Patient Account Number: [id number] Date of Birth/Sex: Treating RN: 12-Jun-1976 (46 y.o.  M) Primary Care Arie Gable: Dorinda Hill Other Clinician: Referring Patryk Conant: Treating Jameyah Fennewald/Extender: Jana Hakim in Treatment: [address] ([id number]) [id number] Page 4 of 9 Vital Signs Height(in): 69 Pulse(bpm): 76 Weight(lbs): 280 Blood Pressure(mmHg): 154/75 Body Mass Index(BMI): 41.3 Temperature(F): 98.0 Respiratory Rate(breaths/min): 20 [2:Photos:] [N/A:N/A] Left Calcaneus N/A N/A Wound Location: Pressure Injury N/A N/A Wounding Event: Pressure Ulcer N/A N/A Primary Etiology: Asthma,  Angina, Hypertension N/A N/A Comorbid History: 10/07/2019 N/A N/A Date Acquired: 125 N/A N/A Weeks of Treatment: Open N/A N/A Wound Status: No N/A N/A Wound Recurrence: 5.4x2.2x0.3 N/A N/A Measurements L x W x D (cm) 9.331 N/A N/A A (cm) : rea 2.799 N/A N/A Volume (cm) : 65.60% N/A N/A % Reduction in A rea: 89.70% N/A N/A % Reduction in Volume: Category/Stage III N/A N/A Classification: Medium N/A N/A Exudate A mount: Serosanguineous N/A N/A Exudate Type: red, brown N/A N/A Exudate Color: Thickened N/A N/A Wound Margin: Medium (34-66%) N/A N/A Granulation A mount: Red, Pink N/A N/A Granulation Quality: Medium (34-66%) N/A N/A Necrotic A mount: Eschar, Adherent Slough N/A N/A Necrotic Tissue: Fat Layer (Subcutaneous Tissue): Yes N/A N/A Exposed Structures: Fascia: No Tendon: No Muscle: No Joint: No Bone: No Medium (34-66%) N/A N/A Epithelialization: Debridement - Selective/Open Wound N/A N/A Debridement: Pre-procedure Verification/Time Out 10:20 N/A N/A Taken: Lidocaine 5% topical ointment N/A N/A Pain Control: Other, Slough N/A N/A Tissue Debrided: Non-Viable Tissue N/A N/A Level: 9.33 N/A N/A Debridement A (sq cm): rea Curette N/A N/A Instrument: Minimum N/A N/A Bleeding: Pressure N/A N/A Hemostasis A chieved: 0 N/A N/A Procedural Pain: 0 N/A N/A Post Procedural Pain: Procedure  was tolerated well N/A N/A Debridement Treatment Response: 5.4x2.2x0.3 N/A N/A Post Debridement Measurements L x W x D (cm) 2.799 N/A N/A Post Debridement Volume: (cm) Category/Stage III N/A N/A Post Debridement Stage: Callus: Yes N/A N/A Periwound Skin Texture: Scarring: Yes Maceration: Yes N/A N/A Periwound Skin Moisture: Dry/Scaly: No No Abnormalities Noted N/A N/A Periwound Skin Color: No Abnormality N/A N/A Temperature: Debridement N/A N/A Procedures Performed: Treatment Notes Wound #2 (Calcaneus) Wound Laterality: Left Cleanser Normal Saline Discharge Instruction: Cleanse the wound with Normal Saline prior to applying a clean dressing using gauze sponges, not tissue or cotton balls. EULA, HOEY ([id number]) [id number] Page 5 of 9 Wound Cleanser Discharge Instruction: Cleanse the wound with wound cleanser prior to applying a clean dressing using gauze sponges, not tissue or cotton balls. Peri-Wound Care Zinc Oxide Ointment 30g tube Discharge Instruction: Apply Zinc Oxide to periwound with each dressing change Topical Gentamicin Discharge Instruction: hold compounding topical antibiotics from [facility] pharmacy Discharge Instruction: HOLD Primary Dressing Maxorb Extra Ag+ Alginate Dressing, 4x4.75 (in/in) Discharge Instruction: Apply to wound bed as instructed Secondary Dressing Drawtex 4x4 in Discharge Instruction: Apply over primary dressing as directed. Optifoam Non-Adhesive Dressing, 4x4 in Discharge Instruction: Apply over primary dressing as directed. Woven Gauze Sponge, Non-Sterile 4x4 in Discharge Instruction: Apply over primary dressing as directed. Secured With 30M Medipore H Soft Cloth Surgical T ape, 4 x 10 (in/yd) Discharge Instruction: Secure with tape as directed. Compression Wrap Kerlix Roll 4.5x3.1 (in/yd) Discharge Instruction: Apply Kerlix and Coban compression as directed. Compression Stockings Add-Ons [place]  Vascular Offloading Boot, Size Regular Electronic Signature(s) Signed: 12/09/2022 1:31:06 PM By: [name] MD FACS Entered By: [name] on 12/09/2022 13:31:06 -------------------------------------------------------------------------------- Multi-Disciplinary Care Plan Details Patient Name: Date of Service: [name], TO [name] 12/09/2022 9:45 A M Medical Record Number: [id number] Patient Account Number: [id number] Date of Birth/Sex: Treating RN: [birth year] (47 y.o. [name] Primary Care Marlicia Sroka: Dorinda Hill Other Clinician: Referring Amere Iott: Treating Harumi Yamin/Extender: Jana Hakim in Treatment: 125 Multidisciplinary Care Plan reviewed with physician Active Inactive Wound/Skin Impairment Nursing Diagnoses: Impaired tissue integrity Knowledge deficit related to ulceration/compromised skin integrity Goals: Patient/caregiver will verbalize understanding of skin care regimen Date Initiated: 07/15/2020 Target Resolution Date: 05/08/2023 Goal Status: Active Ulcer/skin breakdown  will have a volume reduction of 30% by week 4 Date Initiated: 07/15/2020 Date Inactivated: 08/20/2020 Target Resolution Date: 09/03/2020 Goal Status: Unmet Unmet Reason: no major changes. KAELEM, GUIJARRO ([id number]) [id number] Page 6 of 9 Ulcer/skin breakdown will heal within 14 weeks Date Initiated: 12/04/2020 Date Inactivated: 12/10/2020 Target Resolution Date: 12/10/2020 Unmet Reason: wounds still open at 14 Goal Status: Unmet weeks and today 21 weeks. Interventions: Assess patient/caregiver ability to obtain necessary supplies Assess patient/caregiver ability to perform ulcer/skin care regimen upon admission and as needed Assess ulceration(s) every visit Provide education on ulcer and skin care Treatment Activities: Skin care regimen initiated : 07/15/2020 Topical wound management initiated : 07/15/2020 Notes: Electronic  Signature(s) Signed: 12/09/2022 1:26:16 PM By: [name] RN Entered By: [name] on 12/09/2022 12:58:01 -------------------------------------------------------------------------------- Pain Assessment Details Patient Name: Date of Service: [name], TO [name] 12/09/2022 9:45 A M Medical Record Number: [id number] Patient Account Number: [id number] Date of Birth/Sex: Treating RN: 1975-11-15 (47 y.o. M) Primary Care Hjalmar Ballengee: Dorinda Hill Other Clinician: Referring Lekia Nier: Treating Monish Haliburton/Extender: Jana Hakim in Treatment: 125 Active Problems Location of Pain Severity and Description of Pain Patient Has Paino Yes Site Locations Rate the pain. Current Pain Level: 5 Worst Pain Level: 10 Least Pain Level: 0 Tolerable Pain Level: 2 Pain Management and Medication Current Pain Management: Electronic Signature(s) Signed: 12/09/2022 2:37:02 PM By: Dayton Scrape Entered By: Dayton Scrape on 12/09/2022 09:48:56 -------------------------------------------------------------------------------- Patient/Caregiver Education Details Patient Name: Date of Service: Mart Piggs 5/3/2024andnbsp9:45 A M Medical Record Number: [id number] Patient Account Number: [id number] Date of Birth/Gender: Treating RN: [birth year] (47 y.o. [name] Primary Care Physician: Dorinda Hill Other Clinician: Referring Physician: Treating Physician/Extender: Chander, Mccargo ([id number]) [id number] Page 7 of 9 Weeks in Treatment: 125 Education Assessment Education Provided To: Patient Education Topics Provided Wound/Skin Impairment: Methods: Explain/Verbal Responses: Return demonstration correctly Electronic Signature(s) Signed: 12/09/2022 1:26:16 PM By: [name] RN Entered By: [name] on 12/09/2022 12:58:20 -------------------------------------------------------------------------------- Wound Assessment  Details Patient Name: Date of Service: [name], TO [name] 12/09/2022 9:45 A M Medical Record Number: [id number] Patient Account Number: [id number] Date of Birth/Sex: Treating RN: 08/15/1975 (47 y.o. M) Primary Care Jailen Lung: Dorinda Hill Other Clinician: Referring Olanda Downie: Treating Thelia Tanksley/Extender: Jana Hakim in Treatment: 125 Wound Status Wound Number: 2 Primary Etiology: Pressure Ulcer Wound Location: Left Calcaneus Wound Status: Open Wounding Event: Pressure Injury Comorbid History: Asthma, Angina, Hypertension Date Acquired: 10/07/2019 Weeks Of Treatment: 125 Clustered Wound: No Photos Wound Measurements Length: (cm) 5.4 Width: (cm) 2.2 Depth: (cm) 0.3 Area: (cm) 9.331 Volume: (cm) 2.799 % Reduction in Area: 65.6% % Reduction in Volume: 89.7% Epithelialization: Medium (34-66%) Wound Description Classification: Category/Stage III Wound Margin: Thickened Exudate Amount: Medium Exudate Type: Serosanguineous Exudate Color: red, brown Foul Odor After Cleansing: No Slough/Fibrino Yes Wound Bed Granulation Amount: Medium (34-66%) Exposed Structure Granulation Quality: Red, Pink Fascia Exposed: No Necrotic Amount: Medium (34-66%) Fat Layer (Subcutaneous Tissue) Exposed: Yes Necrotic Quality: Eschar, Adherent Slough Tendon Exposed: No Muscle Exposed: No HUMZA, [name] ([id number]) [id number] Page 8 of 9 Joint Exposed: No Bone Exposed: No Periwound Skin Texture Texture Color No Abnormalities Noted: No No Abnormalities Noted: Yes Callus: Yes Temperature / Pain Scarring: Yes Temperature: No Abnormality Moisture No Abnormalities Noted: No Dry / Scaly: No Maceration: Yes Treatment Notes Wound #2 (Calcaneus) Wound Laterality: Left Cleanser Normal Saline Discharge Instruction: Cleanse the wound with Normal Saline prior to applying a clean dressing  using gauze sponges, not tissue or cotton balls. Wound  Cleanser Discharge Instruction: Cleanse the wound with wound cleanser prior to applying a clean dressing using gauze sponges, not tissue or cotton balls. Peri-Wound Care Zinc Oxide Ointment 30g tube Discharge Instruction: Apply Zinc Oxide to periwound with each dressing change Topical Gentamicin Discharge Instruction: hold compounding topical antibiotics from [facility] pharmacy Discharge Instruction: HOLD Primary Dressing Maxorb Extra Ag+ Alginate Dressing, 4x4.75 (in/in) Discharge Instruction: Apply to wound bed as instructed Secondary Dressing Drawtex 4x4 in Discharge Instruction: Apply over primary dressing as directed. Optifoam Non-Adhesive Dressing, 4x4 in Discharge Instruction: Apply over primary dressing as directed. Woven Gauze Sponge, Non-Sterile 4x4 in Discharge Instruction: Apply over primary dressing as directed. Secured With 56M Medipore H Soft Cloth Surgical T ape, 4 x 10 (in/yd) Discharge Instruction: Secure with tape as directed. Compression Wrap Kerlix Roll 4.5x3.1 (in/yd) Discharge Instruction: Apply Kerlix and Coban compression as directed. Compression Stockings Add-Ons [place] Vascular Offloading Boot, Size Regular Electronic Signature(s) Signed: 12/09/2022 2:37:02 PM By: Dayton Scrape Entered By: Dayton Scrape on 12/09/2022 09:56:40 -------------------------------------------------------------------------------- Vitals Details Patient Name: Date of Service: [name], TO [name] 12/09/2022 9:45 A M Medical Record Number: [id number] Patient Account Number: [id number] Date of Birth/Sex: Treating RN: Mar 05, 1976 (46 y.o. M) Primary Care Ganesh Deeg: Dorinda Hill Other Clinician: Referring Lummie Montijo: Treating Adam Demary/Extender: Jana Hakim in Treatment: [address] ([id number]) [id number] Page 9 of 9 Vital Signs Time Taken: 09:47 Temperature (F): 98.0 Height (in): 69 Pulse (bpm): 76 Weight (lbs): 280 Respiratory  Rate (breaths/min): 20 Body Mass Index (BMI): 41.3 Blood Pressure (mmHg): 154/75 Reference Range: 80 - 120 mg / dl Electronic Signature(s) Signed: 12/09/2022 2:37:02 PM By: Dayton Scrape Entered By: Dayton Scrape on 12/09/2022 09:48:10

## 2022-12-10 NOTE — Progress Notes (Signed)
[name], [name] ([id number]) [id number] Page 1 of 16 Visit Report for 12/09/2022 Chief Complaint Document Details Patient Name: Date of Service: [name], TO [name] 12/09/2022 9:45 A M Medical Record Number: [id number] Patient Account Number: [id number] Date of Birth/Sex: Treating RN: May 10, 1976 (47 y.o. M) Primary Care Provider: Dorinda Hill Other Clinician: Referring Provider: Treating Provider/Extender: Jana Hakim in Treatment: 125 Information Obtained from: Patient Chief Complaint Bilateral Plantar Foot Ulcers Electronic Signature(s) Signed: 12/09/2022 1:31:14 PM By: [name] MD FACS Entered By: [name] on 12/09/2022 13:31:14 -------------------------------------------------------------------------------- Debridement Details Patient Name: Date of Service: [name], [name] 12/09/2022 9:45 A M Medical Record Number: [id number] Patient Account Number: [id number] Date of Birth/Sex: Treating RN: 1976/02/06 (47 y.o. [name] Primary Care Provider: Dorinda Hill Other Clinician: Referring Provider: Treating Provider/Extender: Jana Hakim in Treatment: 125 Debridement Performed for Assessment: Wound #2 Left Calcaneus Performed By: Physician [name], MD Debridement Type: Debridement Level of Consciousness (Pre-procedure): Awake and Alert Pre-procedure Verification/Time Out Yes - 10:20 Taken: Start Time: 10:20 Pain Control: Lidocaine 5% topical ointment Percent of Wound Bed Debrided: 100% T Area Debrided (cm): otal 9.33 Tissue and other material debrided: Non-Viable, Slough, [employer], Other: skin Level: Non-Viable Tissue Debridement Description: Selective/Open Wound Instrument: Curette Bleeding: Minimum Hemostasis Achieved: Pressure End Time: 10:21 Procedural Pain: 0 Post Procedural Pain: 0 Response to Treatment: Procedure was tolerated well Level of Consciousness (Post-  Awake and Alert procedure): Post Debridement Measurements of Total Wound Length: (cm) 5.4 Stage: Category/Stage III Width: (cm) 2.2 Depth: (cm) 0.3 Volume: (cm) 2.799 [name] of Wound/Ulcer Post Debridement: Improved Post Procedure Diagnosis Same as Pre-procedure Notes Scribed for Dr. Lady Gary by [name] Electronic Signature(s) Signed: 12/09/2022 1:26:16 PM By: [name] RN [name] ([id number]) [id number] Page 2 of 16 Signed: 12/09/2022 5:53:18 PM By: [name] MD FACS Entered By: [name] on 12/09/2022 10:25:12 -------------------------------------------------------------------------------- HPI Details Patient Name: Date of Service: [name], [name] 12/09/2022 9:45 A M Medical Record Number: [id number] Patient Account Number: [id number] Date of Birth/Sex: Treating RN: 02/01/1976 (47 y.o. M) Primary Care Provider: Dorinda Hill Other Clinician: Referring Provider: Treating Provider/Extender: Jana Hakim in Treatment: 125 History of Present Illness HPI Description: Wounds are12/03/2020 upon evaluation today patient presents for initial inspection here in our clinic concerning issues he has been having with the bottoms of his feet bilaterally. He states these actually occurred as wounds when he was hospitalized for 5 months secondary to Covid. He was apparently with tilting bed where he was in an upright position quite frequently and apparently this occurred in some way shape or form during that time. Fortunately there is no sign of active infection at this time. No fevers, chills, nausea, vomiting, or diarrhea. With that being said he still has substantial wounds on the plantar aspects of his feet Theragen require quite a bit of work to get these to heal. He has been using Santyl currently though that is been problematic both in receiving the medication as well as actually paid for it as it is become  quite expensive. Prior to the experience with Covid the patient really did not have any major medical problems other than hypertension he does have some mild generalized weakness following the Covid experience. 07/22/2020 on evaluation today patient appears to be doing okay in regard to his foot ulcers I feel like the wound beds are showing signs of better improvement that I do believe the Iodoflex is  helping in this regard. With that being said he does have a lot of drainage currently and this is somewhat blue/green in nature which is consistent with Pseudomonas. I do think a culture today would be appropriate for [place] to evaluate and see if that is indeed the case I would likely start him on antibiotic orally as well he is not allergic to Cipro knows of no issues he has had in the past 12/21; patient was admitted to the clinic earlier this month with bilateral presumed pressure ulcers on the bottom of his feet apparently related to excessive pressure from a tilt table arrangement in the intensive care unit. Patient relates this to being on ECMO but I am not really sure that is exactly related to that. I must say I have never seen anything like this. He has fairly extensive full-thickness wounds extending from his heel towards his midfoot mostly centered laterally. There is already been some healing distally. He does not appear to have an arterial issue. He has been using gentamicin to the wound surfaces with Iodoflex to help with ongoing debridement 1/6; this is a patient with pressure ulcers on the bottom of his feet related to excessive pressure from a standing position in the intensive care unit. He is complaining of a lot of pain in the right heel. He is not a diabetic. 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 We have been using Iodoflex to help prepare the surfaces of both wounds for an advanced treatment product. He is nonambulatory spending most of his time in a  wheelchair I have asked him not to propel the wheelchair with his heels 1/13; in general his wounds look better not much surface area change we have been using Iodoflex as of last week. I did an x-ray of the right heel as the patient was complaining of pain. I had some thoughts about a stress fracture perhaps Achilles tendon problems however what it showed was erosive changes along the inferior aspect of the calcaneus he now has a MRI booked for 1/20. 1/20; in general his wounds continue to be better. Some improvement in the large narrow areas proximally in his foot. He is still complaining of pain in the right heel and tenderness in certain areas of this wound. His MRI is tonight. I am not just looking for osteomyelitis that was brought up on the x-ray I am wondering about stress fractures, tendon ruptures etc. He has no such findings on the left. Also noteworthy is that the patient had critical illness neuropathy and some of the discomfort may be actual improvement in nerve function I am just not sure. These wounds were initially in the setting of severe critical illness related to COVID-19. He was put in a standing position. He may have also been on pressors at the point contributing to tissue ischemia. By his description at some point these wounds were grossly necrotic extending proximally up into the Achilles part of his heel. I do not know that I have ever really seen pictures of them like this although they may exist in epic We have ordered Tri layer Oasis. I am trying to stimulate some granulation in these areas. This is of course assuming the MRI is negative for infection 1/27; since the patient was last here he saw Dr. Earlene Plater of infectious disease. He is planned for vancomycin and ceftriaxone. Prior operative culture grew MSSA. Also ordered baseline lab work. He also ordered arterial studies although the ABIs in our clinic  were normal as well as his clinical exam these were normal I do not  think he needs to see vascular surgery. His ABIs at the [facility] were 1.22 in the right triphasic waveforms with a normal TBI of 1.15 on the left ABI of 1.22 with triphasic waveforms and a normal TBI of 1.08. Finally he saw Dr. Logan Bores who will follow him in for 2 months. At this point I do not think he felt that he needed a procedure on the right calcaneal bone. Dr. Earlene Plater is elected for broad-spectrum antibiotic The patient is still having pain in the right heel. He walks with a walker 2/3; wounds are generally smaller. He is tolerating his IV antibiotics. I believe this is vancomycin and ceftriaxone. We are still waiting for Oasis burn in terms of his out-of-pocket max which he should be meeting soon given the IV antibiotics, MRIs etc. I have asked him to check in on this. We are using silver collagen in the meantime the wounds look better 2/10; tolerating IV vancomycin and Rocephin. We are waiting to apply for Oasis. Although I am not really sure where he is in his out-of-pocket max. 2/17 started the first application of Oasis trilayer. Still on antibiotics. The wounds have generally look better. The area on the left has a little more surface slough requiring debridement 2/24; second application of Oasis trilayer. The wound surface granulation is generally look better. The area on the left with undermining laterally I think is come in a bit. 10/08/2020 upon evaluation today patient is here today for [employer] application #3. Fortunately he seems to be doing extremely well with regard to this and we are seeing a lot of new epithelial growth which is great news. Fortunately there is no signs of active infection at this time. 10/16/2020 upon evaluation today patient appears to be doing well with regard to his foot ulcers. Do believe the Oasis has been of benefit for him. I do not see any signs of infection right now which is great news and I think that he has a lot of new epithelial growth which is  great to see as well. The patient is very pleased to hear all of this. I do think we can proceed with the Oasis trilayer #4 today. 3/18; not as much improvement in these areas on his heels that I was hoping. I did reapply trilateral Oasis today the tissue looks healthier but not as much fill in as I was hoping. 3/25; better looking today I think this is come in a bit the tissue looks healthier. Triple layer Oasis reapplied #6 4/1; somewhat better looking definitely better looking surface not as much change in surface area as I was hoping. He may be spending more time [place] on days then he needs to although he does have heel offloading boots. Triple layer Oasis reapplied [name], [name] ([id number]) [id number] Page 3 of 16 4/7; unfortunately apparently [employer] will not approve any further Oasis which is unfortunate since the patient did respond nicely both in terms of the condition of the wound bed as well as surface area. There is still some drainage coming from the wound but not a lot there does not appear to be any infection 4/15; we have been using Hydrofera Blue. He continues to have nice rims of epithelialization on the right greater than the left. The left the epithelialization is coming from the tip of his heel. There is moderate drainage. In this that concerns me  about a total contact cast. There is no evidence of infection 4/29; patient has been using Hydrofera Blue with dressing changes. He has no complaints or issues today. 5/5; using Hydrofera Blue. I actually think that he looks marginally better than the last time I saw this 3 weeks ago. There are rims of epithelialization on the left thumb coming from the medial side on the right. Using Hydrofera Blue 5/12; using Hydrofera Blue. These continue to make improvements in surface area. His drainage was not listed as severe I therefore went ahead and put a cast on the left foot. Right foot we  will continue to dress his previous 5/16; back for first total contact cast change. He did not tolerate this particularly well cast injury on the anterior tibia among other issues. Difficulty sleeping. I talked him about this in some detail and afterwards is elected to continue. I told him I would like to have a cast on for 3 weeks to see if this is going to help at all. I think he agreed 5/19; I think the wound is better. There is no tunneling towards his midfoot. The undermining medially also looks better. He has a rim of new skin distally. I think we are making progress here. The area on the left also continues to look somewhat better to me using Hydrofera Blue. He has a list of complaints about the cast but none of them seem serious 5/26; patient presents for 1 week follow-up. He has been using a total contact cast and tolerating this well. Hydrofera Blue is the main dressing used. He denies signs of infection. 6/2 Hydrofera Blue total contact cast on the left. These were large ulcers that formed in intensive care unit where the patient was recovering from COVID. May have had something to do with being ventilated in an upright positiono Pressors etc. We have been able to get the areas down considerably and a viable surface. There is some epithelialization in both sides. Note made of drainage 6/9; changed to [facility] last time because of drainage. He arrives with better looking surfaces and dimensions on the left than the right. Paradoxically the right actually probes more towards his midfoot the left is largely close down but both of these look improved. Using a total contact cast on the left 6/16; complex wounds on his bilateral plantar heels which were initially pressure injury from a stay in the ICU with COVID. We have been using silver alginate most recently. His dimensions of come in quite dramatically however not recently. We have been putting the left foot in a total contact  cast 6/23; complex wounds on the bilateral plantar heels. I been putting the left in the cast paradoxically the area on the right is the one that is going towards closure at a faster rate. Quite a bit of drainage on the left. The patient went to see Dr. Logan Bores who said he was going to standby for skin grafts. I had actually considered sending him for skin grafts however he would be mandatorily off his feet for a period of weeks to months. I am thinking that the area on the right is going to close on its own the area on the left has been more stubborn even though we have him in a total contact cast 6/30; took him out of a total contact cast last week is the right heel seem to be making better progress than the left where I was placing the cast. We are using silver alginate.  Both wounds are smaller right greater than left 7/12; both wounds look as though they are making some progress. We are using silver alginate. Heel offloading boots 7/26; very gradual progress especially on the right. Using silver alginate. He is wearing heel offloading boots 8/18; he continues to close these wounds down very gradually. Using silver alginate. The problem polymen being definitive about this is areas of what appears to be callus around the margins. This is not a 100% of the area but certainly sizable especially on the right 9/1; bilateral plantar feet wounds secondary to prolonged pressure while being ventilated for COVID-19 in an upright position. Essentially pressure ulcers on the bottom of his feet. He is made substantial progress using silver alginate. 9/14; bilateral plantar feet wounds secondary to prolonged pressure. Making progress using silver alginate. 9/29 bilateral plantar feet wounds secondary to prolonged pressure. I changed him to Iodoflex last week. MolecuLight showing reddened blush fluorescence 10/11; patient presents for follow-up. He has no issues or complaints today. He denies signs of infection.  He continues to use Iodoflex and antibiotic ointment to the wound beds. 10/27; 2-week follow-up. No evidence of infection. He has callus and thick dry skin around the wound margins we have been using Iodoflex and Bactroban which was in response to a moderate left MolecuLight reddish blush fluorescence. 11/10; 2-week follow-up. Wound margins again have thick callus however the measurements of the actual wound sites are a lot smaller. Everything looks reasonably healthy here. We have been using Iodoflex He was approved for prime matrix but I have elected to delay this given the improvement in the surface area. Hopefully I will not regret that decision as were getting close to the end of the year in terms of insurance payment 12/8; 2-week follow-up. Wounds are generally smaller in size. These were initially substantial wounds extending into the forefoot all the way into the heel on the bilateral plantar feet. They are now both located on the plantar heel distal aspect both of these have a lot of callus around the wounds I used a #5 curette to remove this on the right and the left also some subcutaneous debris to try and get the wound edges were using Iodoflex. He has heel offloading shoe 12/22; 2-week follow-up. Not really much improvement. He has thick callus around the outer edges of both wounds. I remove this there is some nonviable subcutaneous tissue as well. We have been using Iodoflex. Her intake nurse and myself spontaneously thought of a total contact cast I went back in May. At that time we really were not seeing much of an improvement with a cast although the wound was in a much different situation I would like to retry this in 2 weeks and I discussed this with the patient 08/12/2021; the patient has had some improvement with the Iodoflex. The the area on the left heel plantar more improved than the right. I had to put him in a total contact cast on the left although I decided to put that off  for 2 weeks. I am going to change his primary dressing to silver collagen. I think in both areas he has had some improvement most of the healing seems to be more proximal in the heel. The wounds are in the mid aspect. A lot of thick callus on the right heel however. 1/19; we are using silver collagen on both plantar heel areas. He has had some improvement today. The left did not require any debridement. He still had some  eschar on the right that was debrided but both seem to have contracted. I did not put it total contact cast on him today 2/2 we have been using silver collagen. The area on the right plantar heel has areas that appear to be epithelialized interspersed with dry flaking callus and dry skin. I removed this. This really looks better than on the other side. On the left still a large area with raised edges and debris on the surface. 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 He has no issues or complaints today. 2/9; not much change in the left foot wound with 1 week of a cast we are using silver collagen. Silver collagen on the right side. The right side has been the better wound surface. We will reapply the total contact cast on the left 2/16; not much improvement on either side I been using silver collagen with a total contact cast on the left. I'm changing the Hydrofera Blue still with a total contact cast on the left 2/23; some improvement on both sides. Disappointing that he has thick callus around the area that we are putting in a total contact cast on the left. We've been using Hydrofera Blue on both wound areas. This is a man who at essentially pressure ulcers in addition to ischemia caused by medications to support his blood pressure (pressors) in the ICU. He was being ventilated in the standing position for severe Covid. A Shiley the wounds extended  across his entire foot but are now localized to his plantar heels bilaterally. We have made progress however neither areas healed. I continue to think the total contact cast is helped albeit painstakingly slowly. He has never wanted a plastic surgery consult although I don't know that they would be interested in grafting in area in this location. 10/07/2021: Continued improvement bilaterally. There is still some callus around the left wound, despite the total contact cast. He has some increased pain in his right midfoot around 1 particular area. This has been painful in the past but seems to be a little bit worse. When his cast was removed today, there was an area on the heel of the left foot that looks a bit macerated. He is also complaining of pain in his left thigh and hip which he thinks is secondary to the limb length discrepancy caused by the cast. 10/14/2021: He continues to improve. A little bit less callus around the left wound. He continues to endorse pain in his right midfoot, but this is not as significant as it was last week. The maceration on his left heel is improved. [name], [name] ([id number]) [id number] Page 4 of 16 10/21/2021: Continued improvement to both wounds. The maceration on his left heel is no longer evident. Less callus bilaterally. Epithelialization progressing. 10/28/2021: Significant improvement this week. The right sided wound is nearly closed with just a small open area at the middle. No maceration seen on the left heel. Continued epithelialization on both sides. No concern for infection. 11/04/2021: T oday, the wounds were measured a little bit differently and come out as larger, but I actually think they are about the same to potentially even smaller, particularly on the left. He continues to accumulate some callus on the right. 11/11/2021: T oday, the patient is expressing some concern that the left wound, despite being in the total contact cast,  is  not progressing at the same rate as the 1 on the right. He is interested in trying a week without the cast to see how the wound does. The wounds are roughly the same size as last week, with the right perhaps being a little bit smaller. He continues to build up callus on both sites. 11/18/2021: Last week, I permitted the patient to go without his total contact cast, just to see if the cast was really making any difference. Today, both wounds have deteriorated to some extent, suggesting that the cast is providing benefit, at least on the left. Both are larger and have accumulated callus, slough, and other debris. 11/26/2021: I debrided both wounds quite aggressively last week in an effort to stimulate the healing cascade. This appears to have been effective as the left sided wound is a full centimeter shorter in length. Although the right was measured slightly larger, on inspection, it looks as though an area of epithelialized tissue was included in the measurements. We have been using PolyMem Ag on the wound surfaces with a total contact cast to the left. 12/02/2021: It appears that the intake personnel are including epithelialized tissue in his wound measurements; the right wound is almost completely epithelialized; there is just a crater at the proximal midfoot with some open areas. On the left, he has built up some callus, but the overall wound surface looks good. There is some senescent skin around the wound margin. He has been in PolyMem Ag bilaterally with a total contact cast on the left. 12/09/2021: The right wound is nearly closed; there is just a small open area at the mid calcaneus. On the left, the wound is smaller with minimal callus buildup. No significant drainage. 12/16/2021: The right calcaneal wound remains minimally open at the mid calcaneus; the rest has epithelialized. On the left, the wound is also a little bit smaller. There is some senescent tissue on the periphery. He is getting  his first application of a trial skin substitute called Vendaje today. 12/23/2021: The wound on his right calcaneus is nearly closed; there is just a small area at the most distal aspect of the calcaneus that is open. On the left, the area where we applied to the skin substitute has a healthier-looking bed of granulation tissue. The wound dimensions are not significantly different on this side but the wound surface is improved. 12/30/2021: The wound on the right calcaneus has not changed significantly aside from some accumulation of callus. On the left, the open area is smaller and continues to have an improved surface. He continues to accumulate callus around the wound. He is here for his third application of Vendaje. 01/06/2022: The right calcaneal wound is down to just a couple of millimeters. He continues to accumulate periwound callus. He unfortunately got his cast wet earlier in the week and his left foot is macerated, resulting in some superficial skin loss just distal to the open wound. The open wound itself, however, is much smaller and has a healthier appearing surface. He is here for his fourth application of Vendaje. 01/13/2022: The right calcaneal wound is about the same. Unfortunately, once again, his cast got wet and his foot is again macerated. This is caused the left calcaneal wound to enlarge. He is here for his fifth application of Vendaje. 01/20/2022: The right calcaneal wound is very small. There is some periwound callus accumulation. He purchased a new cast protector last week and this has been effective in avoiding the maceration that has been  occurring on the left. The left calcaneal wound is narrower and has a healthy and viable-appearing surface. He is here for his 6 application of Vendaje. 01/27/2022: The right calcaneal wound is down to just a pinhole. There is some periwound slough and callus. On the left, the wound is narrower and shorter by about a centimeter. The surface is  robust and viable-appearing. Unfortunately, the rep for the trial skin substitute product did not provide any for [place] to use today. 02/04/2022: The right calcaneal wound remains unchanged. There is more accumulated callus. On the left, although the intake nurse measured it a little bit longer, it looks about the same to me. The surface has a layer of slough, but underneath this, there is good granulation tissue. 02/10/2022: The right calcaneus wound is nearly closed. There is still some callus that builds up around the site. The left side looks about the same in terms of dimensions, but the surface is more robust and vital-appearing. 02/16/2022: The area of the right calcaneus that was nearly closed last week has closed, but there is a small opening at the mid foot where it looks like some moisture got retained and caused some reopening. The left foot wound is narrower and shallower. Both sites have a fair amount of periwound callus and eschar. 02/24/2022: The small midfoot opening on the right calcaneus is a little bit smaller today. The left foot wound is narrower and shallower. He continues to accumulate periwound callus. No concern for infection. 03/01/2022: The patient came to clinic early because he showered and got his cast wet. Fortunately, there is no significant maceration to his foot but the callus softened and it looks like the wound on his left calcaneus may be a little bit wider. The wound on his right calcaneus is just a narrow slit. Continued accumulation of periwound callus bilaterally. 03/08/2022: The wound on his right calcaneus is very nearly closed, just a small pinpoint opening under a bit of eschar; the left wound has come in quite a bit, as well. It is narrower and shorter than at our last visit. Still with accumulated callus and eschar bilaterally. 03/17/2022: The right calcaneal wound is healed. The left wound is smaller and the surface itself is very clean, but there is some  blue-green staining on the periwound callus, concerning for Pseudomonas aeruginosa. 03/23/2022: The right calcaneal wound remains closed. The left wound continues to contract. No further blue-green staining. Small amount of callus and slough accumulation. 03/28/2022: He came in early today because he had gotten his cast wet. On inspection, the wound itself did not get wet or macerated, just a little bit of the forefoot. The wound itself is basically unchanged. 04/07/2022: The right foot wound remains closed. The left wound is the smallest that I have seen it to date. It is narrower and shorter. It still continues to accumulate slough on the surface. 04/15/2022: There is a band of epithelium now dividing the small left plantar foot wound in 2. There is still some slough on the surface. 04/21/2022: The wound continues to narrow. Just a little bit of slough on the surface. He seems to be responding well to endoform. 04/28/2022: Continued slow contraction of the wound. There is a little slough on the surface and some periwound callus. We have been using endoform and total contact cast. 05/05/2022: The wound appears to have stalled. There is slough and some periwound eschar/callus. No concern for infection, however. [name], [name] ([id number]) [id number] Page 5 of 16 05/12/2022:  Unfortunately, his right foot has reopened. It is located at the most posterior aspect of his surgical incision. The area was noted to have drainage coming from it when his padding was removed today. Underneath some callus and senescent skin, there is an opening. No purulent drainage or malodor. On the left foot, the wound is again unchanged. There is some light blue staining on the callus, but no malodor or purulent drainage. 10/13; right and left heel remanence of extensive plantar foot wounds. These are better than I remember by quite a big margin however he is still left with wounds on the left plantar  heel and the right plantar heel. Been using endoform bilaterally. A culture was done that showed apparently Pseudomonas but we are still waiting for the [facility] antibiotic to use gentamicin today. He is still very active by description I am not sure about the offloading of his noncasted right foot 10/20; both wounds right and left heel debrided not much change from last week. [name] Echevaria has arrived which is linezolid, gentamicin and ciprofloxacin we will use this with endoform. T contact cast on the left otal 06/02/2022: Both wounds are smaller today. There is still a fair amount of callus buildup around the right foot ulcer. The left is more superficial and nearly flush with the surrounding tissues. Also with slough and eschar buildup. 06/10/2022: The right sided wound appears to be nearly closed, if not completely so, although it is somewhat difficult to tell given the abnormal tissue and scarring in his foot. There is a fair amount of callus and crust accumulation. On the left, the wound looks about the same, again with callus and slough. He has an appointment next Thursday with Dr. Annamary Rummage in podiatry; I am hopeful that there may be some reconstructive options available for Mr. Banaga. 06/16/2022: Both wounds have some eschar and callus accumulation. The right sided wound is extremely narrow and barely open; the left is narrower than last week. There is a little bit of slough. He has his appointment in podiatry later today. 06/23/2022: The patient met with Dr. Annamary Rummage last week and unfortunately, there are no reconstructive options that he believes would be helpful. He did order an MRI to evaluate for osteomyelitis and fortunately, none was seen. The left sided wound is a little bit shorter and narrower today. The right sided wound is about the same. There is callus and eschar accumulation bilaterally. 06/29/2022: Both feet have improved from last week. There is epithelium making a valiant  effort to creep across the surface on the left. The right side looks like it got a little dry and the deep crevasse in his midfoot has cracked. Both have eschar and there is some slough on the left. 07/07/2022: Both feet have improved. There is epithelium completely covering the calcaneus on the right with just a small opening in the crevasse in his midfoot. On the left, the open area of tissue is smaller but he continues to build up callus/eschar and slough. 07/15/2022: The opening in the midfoot on the right is about the same size, covered with eschar and a little bit of slough. The open portion of the left wound is narrower and shorter with a bit of slough buildup. He admits to being on his feet more than recommended. 12/14; as far as I can tell everything on the right foot is closed. There is some eschar I removed some of this I cannot identify any open wound here. As usual this will be a very  vulnerable area going forward. On the left this looks really quite healthy. I was pleasantly surprised to see how good this looked. Wound is certainly smaller and there appears to be healthy epithelialization. He has been using Promogran on the right and endoform on the left. He has been offloading the right foot with a heel offloading boot and he has a running shoe on the right foot 08/04/2022: The right foot remains closed. He has a thick cushioned insole in his sneaker. The left sided wound is smaller with just some slough and eschar accumulation. He is wearing the heel off loader on this foot. 08/15/2022: The right foot remains closed. The left wound has narrowed further. There is some slough and eschar accumulation. 08/25/2022: We put him in a peg assist shoe insert and as a result, he has more epithelialization of the ulcer with minimal slough and eschar accumulation. 09/01/2022: The wound is smaller by about half this week. Still with some slough on the surface. The peg assist shoe seems to be doing a  remarkable job of adequately offloading the site. 09/08/2022: There is a little bit more epithelium coming in. There is some slough and callus buildup. 09/16/2022: The wound measures about the same size, but the epithelium that has grown in looks more robust and stronger. There is some slough and callus buildup. 09/23/2022: The wound remains about the same size. The skin edges are looking rather senescent. 09/29/2022: The aggressive debridement I performed last week seems to have been effective. The wound is smaller and has significantly less slough accumulation. 10/06/2022: There has been quite a bit of epithelialization since last week. There are still some open areas with slough accumulation. There is some callus buildup around the perimeter. 10/13/2022: Continued improvement. Minimal callus accumulation. 3/14; patient presents for follow-up. He has been using endoform to the wound bed without issues. He is using a surgical shoe with peg assist for offloading. 10/27/2022: The wound dimensions are stable. There is some senescent skin accumulation around the perimeter. 11/04/2022: Last week I performed a very aggressive debridement in an effort to stimulate the healing cascade. As has been the case when I have done this before, the wound has responded well. There has been epithelialization and contraction of the wound. There are just a couple of small open areas. 11/10/2022: Unfortunately, his foot got wet secondary to sweating and there has been some breakdown of the tissue, particularly the skin just adjacent to the main ulcer. 11/18/2022: We continue to struggle with moisture-related tissue breakdown around the perimeter of his wound. This has caused the thin epithelium that had formed on the surface to disintegrate. The underlying surface of the wound has good granulation tissue. 11/25/2022: The edges of the wound are much less macerated, but the surface is actually looking a little bit dry. There is slough  accumulation. No obvious signs of infection. 12/01/2022: The wound edges are more macerated and broken down, making the wound larger. The surface has some slough on it. 12/08/2020: The wound looks better this week. There is significantly less maceration and moisture-related tissue breakdown. There is some slough on the wound surface. Electronic Signature(s) Signed: 12/09/2022 1:31:58 PM By: [name] MD [name] ([id number]) [id number] Page 6 of 16 Entered By: [name] on 12/09/2022 13:31:57 -------------------------------------------------------------------------------- Physical Exam Details Patient Name: Date of Service: [name] [name] 12/09/2022 9:45 A M Medical Record Number: [id number] Patient Account Number: [id number] Date of Birth/Sex: Treating RN: February 14, 1976 (47 y.o. M) Primary  Care Provider: Dorinda Hill Other Clinician: Referring Provider: Treating Provider/Extender: Jana Hakim in Treatment: 125 Constitutional Hypertensive, asymptomatic. . . . no acute distress. Respiratory Normal work of breathing on room air. Notes 12/08/2020: The wound looks better this week. There is significantly less maceration and moisture-related tissue breakdown. There is some slough on the wound surface. Electronic Signature(s) Signed: 12/09/2022 1:32:55 PM By: [name] MD FACS Entered By: [name] on 12/09/2022 13:32:55 -------------------------------------------------------------------------------- Physician Orders Details Patient Name: Date of Service: [name], TO [name] 12/09/2022 9:45 A M Medical Record Number: [id number] Patient Account Number: [id number] Date of Birth/Sex: Treating RN: Mar 31, 1976 (47 y.o. [name] Primary Care Provider: Dorinda Hill Other Clinician: Referring Provider: Treating Provider/Extender: Jana Hakim in Treatment: 125 Verbal / Phone Orders:  No Diagnosis Coding ICD-10 Coding Code Description [phone] Non-pressure chronic ulcer of other part of left foot with fat layer exposed Follow-up Appointments ppointment in 1 week. - Dr. Lady Gary Room 3 Return A Anesthetic Wound #2 Left Calcaneus (In clinic) Topical Lidocaine 4% applied to wound bed - In clinic Bathing/ Shower/ Hygiene May shower and wash wound with soap and water. Edema Control - Lymphedema / SCD / Other Bilateral Lower Extremities Avoid standing for long periods of time. Moisturize legs daily. - as needed Off-Loading Other: - keep pressure off of the bottom of your feet. Elevate legs throughout the day. Use the Shoe with the PegAssist off-loading insole Additional Orders / Instructions Follow Nutritious Diet - Try to get 70-100 grams of Protein a day+ Wound Treatment Wound #2 - Calcaneus Wound Laterality: Left Cleanser: Normal Saline (Generic) 1 x Per Day/30 Days Discharge Instructions: Cleanse the wound with Normal Saline prior to applying a clean dressing using gauze sponges, not tissue or cotton balls. CAFFREY, HERDMAN ([id number]) [id number] Page 7 of 16 Cleanser: Wound Cleanser (Generic) 1 x Per Day/30 Days Discharge Instructions: Cleanse the wound with wound cleanser prior to applying a clean dressing using gauze sponges, not tissue or cotton balls. Peri-Wound Care: Zinc Oxide Ointment 30g tube 1 x Per Day/30 Days Discharge Instructions: Apply Zinc Oxide to periwound with each dressing change Topical: Gentamicin 1 x Per Day/30 Days Discharge Instructions: hold Topical: compounding topical antibiotics from [facility] pharmacy 1 x Per Day/30 Days Discharge Instructions: HOLD Prim Dressing: Maxorb Extra Ag+ Alginate Dressing, 4x4.75 (in/in) 1 x Per Day/30 Days ary Discharge Instructions: Apply to wound bed as instructed Secondary Dressing: Drawtex 4x4 in 1 x Per Day/30 Days Discharge Instructions: Apply over primary dressing as  directed. Secondary Dressing: Optifoam Non-Adhesive Dressing, 4x4 in (Generic) 1 x Per Day/30 Days Discharge Instructions: Apply over primary dressing as directed. Secondary Dressing: Woven Gauze Sponge, Non-Sterile 4x4 in (Generic) 1 x Per Day/30 Days Discharge Instructions: Apply over primary dressing as directed. Secured With: 54M Medipore H Soft Cloth Surgical T ape, 4 x 10 (in/yd) (Generic) 1 x Per Day/30 Days Discharge Instructions: Secure with tape as directed. Compression Wrap: Kerlix Roll 4.5x3.1 (in/yd) 1 x Per Day/30 Days Discharge Instructions: Apply Kerlix and Coban compression as directed. Add-Ons: [employer] Offloading Boot, Size Regular 1 x Per Day/30 Days Electronic Signature(s) Signed: 12/09/2022 5:53:18 PM By: [name] MD FACS Previous Signature: 12/09/2022 1:26:16 PM Version By: [name] RN Entered By: [name] on 12/09/2022 13:33:11 -------------------------------------------------------------------------------- Problem List Details Patient Name: Date of Service: [name], TO [name] 12/09/2022 9:45 A M Medical Record Number: [id number] Patient Account Number: [id number] Date of Birth/Sex: Treating RN: Mar 18, 1976 (  47 y.o. M) Primary Care Provider: Dorinda Hill Other Clinician: Referring Provider: Treating Provider/Extender: Jana Hakim in Treatment: 125 Active Problems ICD-10 Encounter Code Description Active Date MDM Diagnosis L97.522 Non-pressure chronic ulcer of other part of left foot with fat layer exposed 09/03/2020 No Yes Inactive Problems ICD-10 Code Description Active Date Inactive Date L97.512 Non-pressure chronic ulcer of other part of right foot with fat layer exposed 09/03/2020 09/03/2020 L89.893 Pressure ulcer of other site, stage 3 07/15/2020 07/15/2020 M62.81 Muscle weakness (generalized) 07/15/2020 07/15/2020 [name] ([id number]) [id number] Page 8 of 16 I10 Essential  (primary) hypertension 07/15/2020 07/15/2020 M86.171 Other acute osteomyelitis, right ankle and foot 09/03/2020 09/03/2020 Resolved Problems Electronic Signature(s) Signed: 12/09/2022 1:30:58 PM By: [name] MD FACS Entered By: [name] on 12/09/2022 13:30:58 -------------------------------------------------------------------------------- Progress Note Details Patient Name: Date of Service: [name], [name] 12/09/2022 9:45 A M Medical Record Number: [id number] Patient Account Number: [id number] Date of Birth/Sex: Treating RN: 08-24-1975 (47 y.o. M) Primary Care Provider: Dorinda Hill Other Clinician: Referring Provider: Treating Provider/Extender: Jana Hakim in Treatment: 125 Subjective Chief Complaint Information obtained from Patient Bilateral Plantar Foot Ulcers History of Present Illness (HPI) Wounds are12/03/2020 upon evaluation today patient presents for initial inspection here in our clinic concerning issues he has been having with the bottoms of his feet bilaterally. He states these actually occurred as wounds when he was hospitalized for 5 months secondary to Covid. He was apparently with tilting bed where he was in an upright position quite frequently and apparently this occurred in some way shape or form during that time. Fortunately there is no sign of active infection at this time. No fevers, chills, nausea, vomiting, or diarrhea. With that being said he still has substantial wounds on the plantar aspects of his feet Theragen require quite a bit of work to get these to heal. He has been using Santyl currently though that is been problematic both in receiving the medication as well as actually paid for it as it is become quite expensive. Prior to the experience with Covid the patient really did not have any major medical problems other than hypertension he does have some mild generalized weakness following the Covid experience. 07/22/2020 on  evaluation today patient appears to be doing okay in regard to his foot ulcers I feel like the wound beds are showing signs of better improvement that I do believe the Iodoflex is helping in this regard. With that being said he does have a lot of drainage currently and this is somewhat blue/green in nature which is consistent with Pseudomonas. I do think a culture today would be appropriate for [place] to evaluate and see if that is indeed the case I would likely start him on antibiotic orally as well he is not allergic to Cipro knows of no issues he has had in the past 12/21; patient was admitted to the clinic earlier this month with bilateral presumed pressure ulcers on the bottom of his feet apparently related to excessive pressure from a tilt table arrangement in the intensive care unit. Patient relates this to being on ECMO but I am not really sure that is exactly related to that. I must say I have never seen anything like this. He has fairly extensive full-thickness wounds extending from his heel towards his midfoot mostly centered laterally. There is already been some healing distally. He does not appear to have an arterial issue. He has been using gentamicin to the wound surfaces  with Iodoflex to help with ongoing debridement 1/6; this is a patient with pressure ulcers on the bottom of his feet related to excessive pressure from a standing position in the intensive care unit. He is complaining of a lot of pain in the right heel. He is not a diabetic. 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 We have been using Iodoflex to help prepare the surfaces of both wounds for an advanced treatment product. He is nonambulatory spending most of his time in a wheelchair I have asked him not to propel the wheelchair with his heels 1/13; in general his wounds look better not much surface area change we have been using Iodoflex as of last week. I did an x-ray of the right heel as the patient  was complaining of pain. I had some thoughts about a stress fracture perhaps Achilles tendon problems however what it showed was erosive changes along the inferior aspect of the calcaneus he now has a MRI booked for 1/20. 1/20; in general his wounds continue to be better. Some improvement in the large narrow areas proximally in his foot. He is still complaining of pain in the right heel and tenderness in certain areas of this wound. His MRI is tonight. I am not just looking for osteomyelitis that was brought up on the x-ray I am wondering about stress fractures, tendon ruptures etc. He has no such findings on the left. Also noteworthy is that the patient had critical illness neuropathy and some of the discomfort may be actual improvement in nerve function I am just not sure. These wounds were initially in the setting of severe critical illness related to COVID-19. He was put in a standing position. He may have also been on pressors at the point contributing to tissue ischemia. By his description at some point these wounds were grossly necrotic extending proximally up into the Achilles part of his heel. I do not know that I have ever really seen pictures of them like this although they may exist in epic We have ordered Tri layer Oasis. I am trying to stimulate some granulation in these areas. This is of course assuming the MRI is negative for infection 1/27; since the patient was last here he saw Dr. Earlene Plater of infectious disease. He is planned for vancomycin and ceftriaxone. Prior operative culture grew MSSA. Also ordered baseline lab work. He also ordered arterial studies although the ABIs in our clinic were normal as well as his clinical exam these were normal I do not think he needs to see vascular surgery. His ABIs at the PTA were 1.22 in the right triphasic waveforms with a normal TBI of 1.15 on the left ABI of 1.22 with triphasic waveforms and a normal TBI of 1.08. Finally he saw Dr. Logan Bores who  will follow him in for 2 months. At this point I do not think he felt that he needed a procedure on the right calcaneal bone. Dr. Earlene Plater is elected for broad-spectrum antibiotic The patient is still having pain in the right heel. He walks with a walker 2/3; wounds are generally smaller. He is tolerating his IV antibiotics. I believe this is vancomycin and ceftriaxone. We are still waiting for Oasis burn in terms of his out-of-pocket max which he should be meeting soon given the IV antibiotics, MRIs etc. I have asked him to check in on this. We are using silver [name], [name] ([id number]) [id number] Page 9 of 16 collagen in the meantime the wounds look  better 2/10; tolerating IV vancomycin and Rocephin. We are waiting to apply for Oasis. Although I am not really sure where he is in his out-of-pocket max. 2/17 started the first application of Oasis trilayer. Still on antibiotics. The wounds have generally look better. The area on the left has a little more surface slough requiring debridement 2/24; second application of Oasis trilayer. The wound surface granulation is generally look better. The area on the left with undermining laterally I think is come in a bit. 10/08/2020 upon evaluation today patient is here today for [employer] application #3. Fortunately he seems to be doing extremely well with regard to this and we are seeing a lot of new epithelial growth which is great news. Fortunately there is no signs of active infection at this time. 10/16/2020 upon evaluation today patient appears to be doing well with regard to his foot ulcers. Do believe the Oasis has been of benefit for him. I do not see any signs of infection right now which is great news and I think that he has a lot of new epithelial growth which is great to see as well. The patient is very pleased to hear all of this. I do think we can proceed with the Oasis trilayer #4 today. 3/18; not as much  improvement in these areas on his heels that I was hoping. I did reapply trilateral Oasis today the tissue looks healthier but not as much fill in as I was hoping. 3/25; better looking today I think this is come in a bit the tissue looks healthier. Triple layer Oasis reapplied #6 4/1; somewhat better looking definitely better looking surface not as much change in surface area as I was hoping. He may be spending more time [place] on days then he needs to although he does have heel offloading boots. Triple layer Oasis reapplied #7 4/7; unfortunately apparently [facility] will not approve any further Oasis which is unfortunate since the patient did respond nicely both in terms of the condition of the wound bed as well as surface area. There is still some drainage coming from the wound but not a lot there does not appear to be any infection 4/15; we have been using Hydrofera Blue. He continues to have nice rims of epithelialization on the right greater than the left. The left the epithelialization is coming from the tip of his heel. There is moderate drainage. In this that concerns me about a total contact cast. There is no evidence of infection 4/29; patient has been using Hydrofera Blue with dressing changes. He has no complaints or issues today. 5/5; using Hydrofera Blue. I actually think that he looks marginally better than the last time I saw this 3 weeks ago. There are rims of epithelialization on the left thumb coming from the medial side on the right. Using Hydrofera Blue 5/12; using Hydrofera Blue. These continue to make improvements in surface area. His drainage was not listed as severe I therefore went ahead and put a cast on the left foot. Right foot we will continue to dress his previous 5/16; back for first total contact cast change. He did not tolerate this particularly well cast injury on the anterior tibia among other issues. Difficulty sleeping. I talked him about this in some  detail and afterwards is elected to continue. I told him I would like to have a cast on for 3 weeks to see if this is going to help at all. I think he agreed 5/19; I  think the wound is better. There is no tunneling towards his midfoot. The undermining medially also looks better. He has a rim of new skin distally. I think we are making progress here. The area on the left also continues to look somewhat better to me using Hydrofera Blue. He has a list of complaints about the cast but none of them seem serious 5/26; patient presents for 1 week follow-up. He has been using a total contact cast and tolerating this well. Hydrofera Blue is the main dressing used. He denies signs of infection. 6/2 Hydrofera Blue total contact cast on the left. These were large ulcers that formed in intensive care unit where the patient was recovering from COVID. May have had something to do with being ventilated in an upright positiono Pressors etc. We have been able to get the areas down considerably and a viable surface. There is some epithelialization in both sides. Note made of drainage 6/9; changed to [facility] last time because of drainage. He arrives with better looking surfaces and dimensions on the left than the right. Paradoxically the right actually probes more towards his midfoot the left is largely close down but both of these look improved. Using a total contact cast on the left 6/16; complex wounds on his bilateral plantar heels which were initially pressure injury from a stay in the ICU with COVID. We have been using silver alginate most recently. His dimensions of come in quite dramatically however not recently. We have been putting the left foot in a total contact cast 6/23; complex wounds on the bilateral plantar heels. I been putting the left in the cast paradoxically the area on the right is the one that is going towards closure at a faster rate. Quite a bit of drainage on the left. The patient went  to see Dr. Logan Bores who said he was going to standby for skin grafts. I had actually considered sending him for skin grafts however he would be mandatorily off his feet for a period of weeks to months. I am thinking that the area on the right is going to close on its own the area on the left has been more stubborn even though we have him in a total contact cast 6/30; took him out of a total contact cast last week is the right heel seem to be making better progress than the left where I was placing the cast. We are using silver alginate. Both wounds are smaller right greater than left 7/12; both wounds look as though they are making some progress. We are using silver alginate. Heel offloading boots 7/26; very gradual progress especially on the right. Using silver alginate. He is wearing heel offloading boots 8/18; he continues to close these wounds down very gradually. Using silver alginate. The problem polymen being definitive about this is areas of what appears to be callus around the margins. This is not a 100% of the area but certainly sizable especially on the right 9/1; bilateral plantar feet wounds secondary to prolonged pressure while being ventilated for COVID-19 in an upright position. Essentially pressure ulcers on the bottom of his feet. He is made substantial progress using silver alginate. 9/14; bilateral plantar feet wounds secondary to prolonged pressure. Making progress using silver alginate. 9/29 bilateral plantar feet wounds secondary to prolonged pressure. I changed him to Iodoflex last week. MolecuLight showing reddened blush fluorescence 10/11; patient presents for follow-up. He has no issues or complaints today. He denies signs of infection. He continues to use  Iodoflex and antibiotic ointment to the wound beds. 10/27; 2-week follow-up. No evidence of infection. He has callus and thick dry skin around the wound margins we have been using Iodoflex and Bactroban which was in response  to a moderate left MolecuLight reddish blush fluorescence. 11/10; 2-week follow-up. Wound margins again have thick callus however the measurements of the actual wound sites are a lot smaller. Everything looks reasonably healthy here. We have been using Iodoflex He was approved for prime matrix but I have elected to delay this given the improvement in the surface area. Hopefully I will not regret that decision as were getting close to the end of the year in terms of insurance payment 12/8; 2-week follow-up. Wounds are generally smaller in size. These were initially substantial wounds extending into the forefoot all the way into the heel on the bilateral plantar feet. They are now both located on the plantar heel distal aspect both of these have a lot of callus around the wounds I used a #5 curette to remove this on the right and the left also some subcutaneous debris to try and get the wound edges were using Iodoflex. He has heel offloading shoe 12/22; 2-week follow-up. Not really much improvement. He has thick callus around the outer edges of both wounds. I remove this there is some nonviable subcutaneous tissue as well. We have been using Iodoflex. Her intake nurse and myself spontaneously thought of a total contact cast I went back in May. At that time we really were not seeing much of an improvement with a cast although the wound was in a much different situation I would like to retry this in 2 weeks and I discussed this with the patient 08/12/2021; the patient has had some improvement with the Iodoflex. The the area on the left heel plantar more improved than the right. I had to put him in a total contact cast on the left although I decided to put that off for 2 weeks. I am going to change his primary dressing to silver collagen. I think in both areas he has had some improvement most of the healing seems to be more proximal in the heel. The wounds are in the mid aspect. A lot of thick callus on the  right heel however. 1/19; we are using silver collagen on both plantar heel areas. He has had some improvement today. The left did not require any debridement. He still had some eschar on the right that was debrided but both seem to have contracted. I did not put it total contact cast on him today 2/2 we have been using silver collagen. The area on the right plantar heel has areas that appear to be epithelialized interspersed with dry flaking callus and dry skin. I removed this. This really looks better than on the other side. On the left still a large area with raised edges and debris on the surface. 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 He has no issues or complaints today. 2/9; not much change in the left foot wound with 1 week of a cast we are using silver collagen. Silver collagen on the right side. The right side has been the [name], [name] ([id number]) [id number] Page 10 of 16 better wound surface. We will reapply the total contact cast on the left 2/16; not much improvement on either side I been  using silver collagen with a total contact cast on the left. I'm changing the Hydrofera Blue still with a total contact cast on the left 2/23; some improvement on both sides. Disappointing that he has thick callus around the area that we are putting in a total contact cast on the left. We've been using Hydrofera Blue on both wound areas. This is a man who at essentially pressure ulcers in addition to ischemia caused by medications to support his blood pressure (pressors) in the ICU. He was being ventilated in the standing position for severe Covid. A Shiley the wounds extended across his entire foot but are now localized to his plantar heels bilaterally. We have made progress however neither areas healed. I continue to think the total  contact cast is helped albeit painstakingly slowly. He has never wanted a plastic surgery consult although I don't know that they would be interested in grafting in area in this location. 10/07/2021: Continued improvement bilaterally. There is still some callus around the left wound, despite the total contact cast. He has some increased pain in his right midfoot around 1 particular area. This has been painful in the past but seems to be a little bit worse. When his cast was removed today, there was an area on the heel of the left foot that looks a bit macerated. He is also complaining of pain in his left thigh and hip which he thinks is secondary to the limb length discrepancy caused by the cast. 10/14/2021: He continues to improve. A little bit less callus around the left wound. He continues to endorse pain in his right midfoot, but this is not as significant as it was last week. The maceration on his left heel is improved. 10/21/2021: Continued improvement to both wounds. The maceration on his left heel is no longer evident. Less callus bilaterally. Epithelialization progressing. 10/28/2021: Significant improvement this week. The right sided wound is nearly closed with just a small open area at the middle. No maceration seen on the left heel. Continued epithelialization on both sides. No concern for infection. 11/04/2021: T oday, the wounds were measured a little bit differently and come out as larger, but I actually think they are about the same to potentially even smaller, particularly on the left. He continues to accumulate some callus on the right. 11/11/2021: T oday, the patient is expressing some concern that the left wound, despite being in the total contact cast, is not progressing at the same rate as the 1 on the right. He is interested in trying a week without the cast to see how the wound does. The wounds are roughly the same size as last week, with the right perhaps being a little bit smaller. He  continues to build up callus on both sites. 11/18/2021: Last week, I permitted the patient to go without his total contact cast, just to see if the cast was really making any difference. Today, both wounds have deteriorated to some extent, suggesting that the cast is providing benefit, at least on the left. Both are larger and have accumulated callus, slough, and other debris. 11/26/2021: I debrided both wounds quite aggressively last week in an effort to stimulate the healing cascade. This appears to have been effective as the left sided wound is a full centimeter shorter in length. Although the right was measured slightly larger, on inspection, it looks as though an area of epithelialized tissue was included in the measurements. We have been using PolyMem Ag on the wound surfaces with  a total contact cast to the left. 12/02/2021: It appears that the intake personnel are including epithelialized tissue in his wound measurements; the right wound is almost completely epithelialized; there is just a crater at the proximal midfoot with some open areas. On the left, he has built up some callus, but the overall wound surface looks good. There is some senescent skin around the wound margin. He has been in PolyMem Ag bilaterally with a total contact cast on the left. 12/09/2021: The right wound is nearly closed; there is just a small open area at the mid calcaneus. On the left, the wound is smaller with minimal callus buildup. No significant drainage. 12/16/2021: The right calcaneal wound remains minimally open at the mid calcaneus; the rest has epithelialized. On the left, the wound is also a little bit smaller. There is some senescent tissue on the periphery. He is getting his first application of a trial skin substitute called Vendaje today. 12/23/2021: The wound on his right calcaneus is nearly closed; there is just a small area at the most distal aspect of the calcaneus that is open. On the left, the area  where we applied to the skin substitute has a healthier-looking bed of granulation tissue. The wound dimensions are not significantly different on this side but the wound surface is improved. 12/30/2021: The wound on the right calcaneus has not changed significantly aside from some accumulation of callus. On the left, the open area is smaller and continues to have an improved surface. He continues to accumulate callus around the wound. He is here for his third application of Vendaje. 01/06/2022: The right calcaneal wound is down to just a couple of millimeters. He continues to accumulate periwound callus. He unfortunately got his cast wet earlier in the week and his left foot is macerated, resulting in some superficial skin loss just distal to the open wound. The open wound itself, however, is much smaller and has a healthier appearing surface. He is here for his fourth application of Vendaje. 01/13/2022: The right calcaneal wound is about the same. Unfortunately, once again, his cast got wet and his foot is again macerated. This is caused the left calcaneal wound to enlarge. He is here for his fifth application of Vendaje. 01/20/2022: The right calcaneal wound is very small. There is some periwound callus accumulation. He purchased a new cast protector last week and this has been effective in avoiding the maceration that has been occurring on the left. The left calcaneal wound is narrower and has a healthy and viable-appearing surface. He is here for his 6 application of Vendaje. 01/27/2022: The right calcaneal wound is down to just a pinhole. There is some periwound slough and callus. On the left, the wound is narrower and shorter by about a centimeter. The surface is robust and viable-appearing. Unfortunately, the rep for the trial skin substitute product did not provide any for [place] to use today. 02/04/2022: The right calcaneal wound remains unchanged. There is more accumulated callus. On the left, although  the intake nurse measured it a little bit longer, it looks about the same to me. The surface has a layer of slough, but underneath this, there is good granulation tissue. 02/10/2022: The right calcaneus wound is nearly closed. There is still some callus that builds up around the site. The left side looks about the same in terms of dimensions, but the surface is more robust and vital-appearing. 02/16/2022: The area of the right calcaneus that was nearly closed last week has  closed, but there is a small opening at the mid foot where it looks like some moisture got retained and caused some reopening. The left foot wound is narrower and shallower. Both sites have a fair amount of periwound callus and eschar. 02/24/2022: The small midfoot opening on the right calcaneus is a little bit smaller today. The left foot wound is narrower and shallower. He continues to accumulate periwound callus. No concern for infection. 03/01/2022: The patient came to clinic early because he showered and got his cast wet. Fortunately, there is no significant maceration to his foot but the callus softened and it looks like the wound on his left calcaneus may be a little bit wider. The wound on his right calcaneus is just a narrow slit. Continued accumulation of periwound callus bilaterally. 03/08/2022: The wound on his right calcaneus is very nearly closed, just a small pinpoint opening under a bit of eschar; the left wound has come in quite a bit, as well. It is narrower and shorter than at our last visit. Still with accumulated callus and eschar bilaterally. 03/17/2022: The right calcaneal wound is healed. The left wound is smaller and the surface itself is very clean, but there is some blue-green staining on the periwound callus, concerning for Pseudomonas aeruginosa. [name], [name] ([id number]) [id number] Page 11 of 16 03/23/2022: The right calcaneal wound remains closed. The left wound continues to  contract. No further blue-green staining. Small amount of callus and slough accumulation. 03/28/2022: He came in early today because he had gotten his cast wet. On inspection, the wound itself did not get wet or macerated, just a little bit of the forefoot. The wound itself is basically unchanged. 04/07/2022: The right foot wound remains closed. The left wound is the smallest that I have seen it to date. It is narrower and shorter. It still continues to accumulate slough on the surface. 04/15/2022: There is a band of epithelium now dividing the small left plantar foot wound in 2. There is still some slough on the surface. 04/21/2022: The wound continues to narrow. Just a little bit of slough on the surface. He seems to be responding well to endoform. 04/28/2022: Continued slow contraction of the wound. There is a little slough on the surface and some periwound callus. We have been using endoform and total contact cast. 05/05/2022: The wound appears to have stalled. There is slough and some periwound eschar/callus. No concern for infection, however. 05/12/2022: Unfortunately, his right foot has reopened. It is located at the most posterior aspect of his surgical incision. The area was noted to have drainage coming from it when his padding was removed today. Underneath some callus and senescent skin, there is an opening. No purulent drainage or malodor. On the left foot, the wound is again unchanged. There is some light blue staining on the callus, but no malodor or purulent drainage. 10/13; right and left heel remanence of extensive plantar foot wounds. These are better than I remember by quite a big margin however he is still left with wounds on the left plantar heel and the right plantar heel. Been using endoform bilaterally. A culture was done that showed apparently Pseudomonas but we are still waiting for the [facility] antibiotic to use gentamicin today. He is still very active by description I am not sure  about the offloading of his noncasted right foot 10/20; both wounds right and left heel debrided not much change from last week. [name] Echevaria has arrived which is linezolid, gentamicin and ciprofloxacin we  will use this with endoform. T contact cast on the left otal 06/02/2022: Both wounds are smaller today. There is still a fair amount of callus buildup around the right foot ulcer. The left is more superficial and nearly flush with the surrounding tissues. Also with slough and eschar buildup. 06/10/2022: The right sided wound appears to be nearly closed, if not completely so, although it is somewhat difficult to tell given the abnormal tissue and scarring in his foot. There is a fair amount of callus and crust accumulation. On the left, the wound looks about the same, again with callus and slough. He has an appointment next Thursday with Dr. Annamary Rummage in podiatry; I am hopeful that there may be some reconstructive options available for Mr. Muise. 06/16/2022: Both wounds have some eschar and callus accumulation. The right sided wound is extremely narrow and barely open; the left is narrower than last week. There is a little bit of slough. He has his appointment in podiatry later today. 06/23/2022: The patient met with Dr. Annamary Rummage last week and unfortunately, there are no reconstructive options that he believes would be helpful. He did order an MRI to evaluate for osteomyelitis and fortunately, none was seen. The left sided wound is a little bit shorter and narrower today. The right sided wound is about the same. There is callus and eschar accumulation bilaterally. 06/29/2022: Both feet have improved from last week. There is epithelium making a valiant effort to creep across the surface on the left. The right side looks like it got a little dry and the deep crevasse in his midfoot has cracked. Both have eschar and there is some slough on the left. 07/07/2022: Both feet have improved. There is  epithelium completely covering the calcaneus on the right with just a small opening in the crevasse in his midfoot. On the left, the open area of tissue is smaller but he continues to build up callus/eschar and slough. 07/15/2022: The opening in the midfoot on the right is about the same size, covered with eschar and a little bit of slough. The open portion of the left wound is narrower and shorter with a bit of slough buildup. He admits to being on his feet more than recommended. 12/14; as far as I can tell everything on the right foot is closed. There is some eschar I removed some of this I cannot identify any open wound here. As usual this will be a very vulnerable area going forward. On the left this looks really quite healthy. I was pleasantly surprised to see how good this looked. Wound is certainly smaller and there appears to be healthy epithelialization. He has been using Promogran on the right and endoform on the left. He has been offloading the right foot with a heel offloading boot and he has a running shoe on the right foot 08/04/2022: The right foot remains closed. He has a thick cushioned insole in his sneaker. The left sided wound is smaller with just some slough and eschar accumulation. He is wearing the heel off loader on this foot. 08/15/2022: The right foot remains closed. The left wound has narrowed further. There is some slough and eschar accumulation. 08/25/2022: We put him in a peg assist shoe insert and as a result, he has more epithelialization of the ulcer with minimal slough and eschar accumulation. 09/01/2022: The wound is smaller by about half this week. Still with some slough on the surface. The peg assist shoe seems to be doing a remarkable job of  adequately offloading the site. 09/08/2022: There is a little bit more epithelium coming in. There is some slough and callus buildup. 09/16/2022: The wound measures about the same size, but the epithelium that has grown in looks more  robust and stronger. There is some slough and callus buildup. 09/23/2022: The wound remains about the same size. The skin edges are looking rather senescent. 09/29/2022: The aggressive debridement I performed last week seems to have been effective. The wound is smaller and has significantly less slough accumulation. 10/06/2022: There has been quite a bit of epithelialization since last week. There are still some open areas with slough accumulation. There is some callus buildup around the perimeter. 10/13/2022: Continued improvement. Minimal callus accumulation. 3/14; patient presents for follow-up. He has been using endoform to the wound bed without issues. He is using a surgical shoe with peg assist for offloading. 10/27/2022: The wound dimensions are stable. There is some senescent skin accumulation around the perimeter. 11/04/2022: Last week I performed a very aggressive debridement in an effort to stimulate the healing cascade. As has been the case when I have done this before, the wound has responded well. There has been epithelialization and contraction of the wound. There are just a couple of small open areas. [name], [name] ([id number]) [id number] Page 12 of 16 11/10/2022: Unfortunately, his foot got wet secondary to sweating and there has been some breakdown of the tissue, particularly the skin just adjacent to the main ulcer. 11/18/2022: We continue to struggle with moisture-related tissue breakdown around the perimeter of his wound. This has caused the thin epithelium that had formed on the surface to disintegrate. The underlying surface of the wound has good granulation tissue. 11/25/2022: The edges of the wound are much less macerated, but the surface is actually looking a little bit dry. There is slough accumulation. No obvious signs of infection. 12/01/2022: The wound edges are more macerated and broken down, making the wound larger. The surface has some slough on  it. 12/08/2020: The wound looks better this week. There is significantly less maceration and moisture-related tissue breakdown. There is some slough on the wound surface. Patient History Information obtained from Patient. Family History Cancer - Maternal Grandparents, Diabetes - Father,Paternal Grandparents, Heart Disease - Maternal Grandparents, Hypertension - Father,Paternal Grandparents, Lung Disease - Siblings, No family history of Hereditary Spherocytosis, Kidney Disease, Seizures, Stroke, Thyroid Problems, Tuberculosis. Social History Never smoker, Marital Status - Married, Alcohol Use - Never, Drug Use - No History, Caffeine Use - Daily - tea, soda. Medical History Eyes Denies history of Cataracts, Glaucoma, Optic Neuritis Ear/Nose/Mouth/Throat Denies history of Chronic sinus problems/congestion, Middle ear problems Hematologic/Lymphatic Denies history of Anemia, Hemophilia, Human Immunodeficiency Virus, Lymphedema, Sickle Cell Disease Respiratory Patient has history of Asthma Denies history of Aspiration, Chronic Obstructive Pulmonary Disease (COPD), Pneumothorax, Sleep Apnea, Tuberculosis Cardiovascular Patient has history of Angina - with COVID, Hypertension Denies history of Arrhythmia, Congestive Heart Failure, Coronary Artery Disease, Deep Vein Thrombosis, Hypotension, Myocardial Infarction, Peripheral Arterial Disease, Peripheral Venous Disease, Phlebitis, Vasculitis Gastrointestinal Denies history of Cirrhosis , Colitis, Crohnoos, Hepatitis A, Hepatitis B, Hepatitis C Endocrine Denies history of Type I Diabetes, Type II Diabetes Genitourinary Denies history of End Stage Renal Disease Immunological Denies history of Lupus Erythematosus, Raynaudoos, Scleroderma Integumentary (Skin) Denies history of History of Burn Musculoskeletal Denies history of Gout, Rheumatoid Arthritis, Osteoarthritis, Osteomyelitis Neurologic Denies history of Dementia, Neuropathy,  Quadriplegia, Paraplegia, Seizure Disorder Oncologic Denies history of Received Chemotherapy, Received Radiation Psychiatric Denies history of Anorexia/bulimia, Confinement Anxiety  Hospitalization/Surgery History - COVID PNA 07/22/2019- 11/14/2019. - 03/27/2020 wound debridement/ skin graft. Medical A Surgical History Notes nd Constitutional Symptoms (General Health) COVID PNA 07/22/2019-11/14/2019 V[facility] ECMO, foot drop left foot , Genitourinary kidney stone Psychiatric anxiety Objective Constitutional Hypertensive, asymptomatic. no acute distress. Vitals Time Taken: 9:47 AM, Height: 69 in, Weight: 280 lbs, BMI: 41.3, Temperature: 98.0 F, Pulse: 76 bpm, Respiratory Rate: 20 breaths/min, Blood Pressure: 154/75 mmHg. Respiratory Normal work of breathing on room air. [name], [name] ([id number]) [id number] Page 13 of 16 General Notes: 12/08/2020: The wound looks better this week. There is significantly less maceration and moisture-related tissue breakdown. There is some slough on the wound surface. Integumentary (Hair, Skin) Wound #2 status is Open. Original cause of wound was Pressure Injury. The date acquired was: 10/07/2019. The wound has been in treatment 125 weeks. The wound is located on the Left Calcaneus. The wound measures 5.4cm length x 2.2cm width x 0.3cm depth; 9.331cm^2 area and 2.799cm^3 volume. There is Fat Layer (Subcutaneous Tissue) exposed. There is a medium amount of serosanguineous drainage noted. The wound margin is thickened. There is medium (34- 66%) red, pink granulation within the wound bed. There is a medium (34-66%) amount of necrotic tissue within the wound bed including Eschar and Adherent Slough. The periwound skin appearance had no abnormalities noted for color. The periwound skin appearance exhibited: Callus, Scarring, Maceration. The periwound skin appearance did not exhibit: Dry/Scaly. Periwound temperature was noted as No  Abnormality. Assessment Active Problems ICD-10 Non-pressure chronic ulcer of other part of left foot with fat layer exposed Procedures Wound #2 Pre-procedure diagnosis of Wound #2 is a Pressure Ulcer located on the Left Calcaneus . There was a Selective/Open Wound Non-Viable Tissue Debridement with a total area of 9.33 sq cm performed by [name], MD. With the following instrument(s): Curette to remove Non-Viable tissue/material. Material removed includes [facility] and Other: skin after achieving pain control using Lidocaine 5% topical ointment. No specimens were taken. A time out was conducted at 10:20, prior to the start of the procedure. A Minimum amount of bleeding was controlled with Pressure. The procedure was tolerated well with a pain level of 0 throughout and a pain level of 0 following the procedure. Post Debridement Measurements: 5.4cm length x 2.2cm width x 0.3cm depth; 2.799cm^3 volume. Post debridement Stage noted as Category/Stage III. [name] of Wound/Ulcer Post Debridement is improved. Post procedure Diagnosis Wound #2: Same as Pre-Procedure General Notes: Scribed for Dr. Lady Gary by [name]. Plan Follow-up Appointments: Return Appointment in 1 week. - Dr. Lady Gary Room 3 Anesthetic: Wound #2 Left Calcaneus: (In clinic) Topical Lidocaine 4% applied to wound bed - In clinic Bathing/ Shower/ Hygiene: May shower and wash wound with soap and water. Edema Control - Lymphedema / SCD / Other: Avoid standing for long periods of time. Moisturize legs daily. - as needed Off-Loading: Other: - keep pressure off of the bottom of your feet. Elevate legs throughout the day. Use the Shoe with the PegAssist off-loading insole Additional Orders / Instructions: Follow Nutritious Diet - Try to get 70-100 grams of Protein a day+ WOUND #2: - Calcaneus Wound Laterality: Left Cleanser: Normal Saline (Generic) 1 x Per Day/30 Days Discharge Instructions: Cleanse the wound with  Normal Saline prior to applying a clean dressing using gauze sponges, not tissue or cotton balls. Cleanser: Wound Cleanser (Generic) 1 x Per Day/30 Days Discharge Instructions: Cleanse the wound with wound cleanser prior to applying a clean dressing using gauze sponges, not tissue or cotton balls.  Peri-Wound Care: Zinc Oxide Ointment 30g tube 1 x Per Day/30 Days Discharge Instructions: Apply Zinc Oxide to periwound with each dressing change Topical: Gentamicin 1 x Per Day/30 Days Discharge Instructions: hold Topical: compounding topical antibiotics from [facility] 1 x Per Day/30 Days Discharge Instructions: HOLD Prim Dressing: Maxorb Extra Ag+ Alginate Dressing, 4x4.75 (in/in) 1 x Per Day/30 Days ary Discharge Instructions: Apply to wound bed as instructed Secondary Dressing: Drawtex 4x4 in 1 x Per Day/30 Days Discharge Instructions: Apply over primary dressing as directed. Secondary Dressing: Optifoam Non-Adhesive Dressing, 4x4 in (Generic) 1 x Per Day/30 Days Discharge Instructions: Apply over primary dressing as directed. Secondary Dressing: Woven Gauze Sponge, Non-Sterile 4x4 in (Generic) 1 x Per Day/30 Days Discharge Instructions: Apply over primary dressing as directed. Secured With: 4M Medipore H Soft Cloth Surgical T ape, 4 x 10 (in/yd) (Generic) 1 x Per Day/30 Days Discharge Instructions: Secure with tape as directed. Com pression Wrap: Kerlix Roll 4.5x3.1 (in/yd) 1 x Per Day/30 Days Discharge Instructions: Apply Kerlix and Coban compression as directed. Add-Ons: [employer] Offloading Boot, Size Regular 1 x Per Day/30 Days [name], [name] ([id number]) [id number] Page 14 of 16 12/08/2020: The wound looks better this week. There is significantly less maceration and moisture-related tissue breakdown. There is some slough on the wound surface. I used a curette to debride slough and skin from the wound. We will continue topical gentamicin, silver  alginate, drawtex, and use the heel offloading shoe. He will continue to wear his cast protector when he showers. Follow-up in 1 week. Electronic Signature(s) Signed: 12/09/2022 1:34:44 PM By: [name] MD FACS Entered By: [name] on 12/09/2022 13:34:44 -------------------------------------------------------------------------------- HxROS Details Patient Name: Date of Service: [name], [name] 12/09/2022 9:45 A M Medical Record Number: [id number] Patient Account Number: [id number] Date of Birth/Sex: Treating RN: 1975/11/10 (47 y.o. M) Primary Care Provider: Dorinda Hill Other Clinician: Referring Provider: Treating Provider/Extender: Jana Hakim in Treatment: 125 Information Obtained From Patient Constitutional Symptoms (General Health) Medical History: Past Medical History Notes: COVID PNA 07/22/2019-11/14/2019 VENT ECMO, foot drop left foot , Eyes Medical History: Negative for: Cataracts; Glaucoma; Optic Neuritis Ear/Nose/Mouth/Throat Medical History: Negative for: Chronic sinus problems/congestion; Middle ear problems Hematologic/Lymphatic Medical History: Negative for: Anemia; Hemophilia; Human Immunodeficiency Virus; Lymphedema; Sickle Cell Disease Respiratory Medical History: Positive for: Asthma Negative for: Aspiration; Chronic Obstructive Pulmonary Disease (COPD); Pneumothorax; Sleep Apnea; Tuberculosis Cardiovascular Medical History: Positive for: Angina - with COVID; Hypertension Negative for: Arrhythmia; Congestive Heart Failure; Coronary Artery Disease; Deep Vein Thrombosis; Hypotension; Myocardial Infarction; Peripheral Arterial Disease; Peripheral Venous Disease; Phlebitis; Vasculitis Gastrointestinal Medical History: Negative for: Cirrhosis ; Colitis; Crohns; Hepatitis A; Hepatitis B; Hepatitis C Endocrine Medical History: Negative for: Type I Diabetes; Type II Diabetes Genitourinary Medical History: Negative for: End  Stage Renal Disease Past Medical History Notes: kidney stone [name], [name] ([id number]) [id number] Page 15 of 16 Immunological Medical History: Negative for: Lupus Erythematosus; Raynauds; Scleroderma Integumentary (Skin) Medical History: Negative for: History of Burn Musculoskeletal Medical History: Negative for: Gout; Rheumatoid Arthritis; Osteoarthritis; Osteomyelitis Neurologic Medical History: Negative for: Dementia; Neuropathy; Quadriplegia; Paraplegia; Seizure Disorder Oncologic Medical History: Negative for: Received Chemotherapy; Received Radiation Psychiatric Medical History: Negative for: Anorexia/bulimia; Confinement Anxiety Past Medical History Notes: anxiety Immunizations Pneumococcal Vaccine: Received Pneumococcal Vaccination: No Implantable Devices None Hospitalization / Surgery History Type of Hospitalization/Surgery COVID PNA 07/22/2019- 11/14/2019 03/27/2020 wound debridement/ skin graft Family and Social History Cancer: Yes - Maternal Grandparents; Diabetes: Yes - Father,Paternal Grandparents; Heart  Disease: Yes - Maternal Grandparents; Hereditary Spherocytosis: No; Hypertension: Yes - Father,Paternal Grandparents; Kidney Disease: No; Lung Disease: Yes - Siblings; Seizures: No; Stroke: No; Thyroid Problems: No; Tuberculosis: No; Never smoker; Marital Status - Married; Alcohol Use: Never; Drug Use: No History; Caffeine Use: Daily - tea, soda; Financial Concerns: No; Food, Clothing or Shelter Needs: No; Support System Lacking: No; Transportation Concerns: No Psychologist, prison and probation services) Signed: 12/09/2022 5:53:18 PM By: [name] MD FACS Entered By: [name] on 12/09/2022 13:32:05 -------------------------------------------------------------------------------- SuperBill Details Patient Name: Date of Service: [name], TO [name] 12/09/2022 Medical Record Number: [id number] Patient Account Number: [id number] Date of  Birth/Sex: Treating RN: [birth year] (47 y.o. [name] Primary Care Provider: Dorinda Hill Other Clinician: Referring Provider: Treating Provider/Extender: Jana Hakim in Treatment: 125 Diagnosis Coding ICD-10 Codes Code Description [phone] Non-pressure chronic ulcer of other part of left foot with fat layer exposed Facility Procedures DENLEY, MCCLEARY ([id number]): CPT4 Code Description [id number] [phone] - WOUND CARE VISIT-LEV 3 EST PT [id number] Page 16 of 16: Modifier Quantity 1 [name], [name] ([id number]): [id number] [phone] - DEBRIDE WOUND 1ST 20 SQ CM OR < ICD-10 Diagnosis Description L97.522 Non-pressure chronic ulcer of other part of left foot with fat [id number] Page 16 of 16: 1 layer exposed Physician Procedures : CPT4 Code Description Modifier [id number] - WC PHYS LEVEL 4 - EST PT 25 ICD-10 Diagnosis Description L97.522 Non-pressure chronic ulcer of other part of left foot with fat layer exposed Quantity: 1 : [id number] - WC PHYS DEBR WO ANESTH 20 SQ CM ICD-10 Diagnosis Description L97.522 Non-pressure chronic ulcer of other part of left foot with fat layer exposed Quantity: 1 Electronic Signature(s) Signed: 12/09/2022 1:34:57 PM By: [name] MD FACS Previous Signature: 12/09/2022 1:26:16 PM Version By: [name] RN Entered By: [name] on 12/09/2022 13:34:57

## 2022-12-12 ENCOUNTER — Ambulatory Visit (INDEPENDENT_AMBULATORY_CARE_PROVIDER_SITE_OTHER): Payer: PPO | Admitting: Family Medicine

## 2022-12-12 ENCOUNTER — Encounter (INDEPENDENT_AMBULATORY_CARE_PROVIDER_SITE_OTHER): Payer: Self-pay | Admitting: Family Medicine

## 2022-12-12 VITALS — BP 136/88 | HR 73 | Temp 98.4°F | Ht 68.0 in | Wt 307.0 lb

## 2022-12-12 DIAGNOSIS — Z6841 Body Mass Index (BMI) 40.0 and over, adult: Secondary | ICD-10-CM

## 2022-12-12 DIAGNOSIS — F324 Major depressive disorder, single episode, in partial remission: Secondary | ICD-10-CM

## 2022-12-12 DIAGNOSIS — E559 Vitamin D deficiency, unspecified: Secondary | ICD-10-CM

## 2022-12-12 DIAGNOSIS — E119 Type 2 diabetes mellitus without complications: Secondary | ICD-10-CM | POA: Diagnosis not present

## 2022-12-12 MED ORDER — VITAMIN D (ERGOCALCIFEROL) 1.25 MG (50000 UNIT) PO CAPS: 50000.0000 [IU] | ORAL_CAPSULE | ORAL | 0 refills | Status: DC

## 2022-12-12 NOTE — Assessment & Plan Note (Signed)
His highest A1c was 6.7.  He is currently sitting in the prediabetic range with his A1c.  He has room for improvement with dietary adherence.  He is trying to avoid high sugar foods and drinks.  His insurance denied coverage for [facility].  Continue working on a reduced calorie diet high in lean protein and dietary fiber.  Avoid high sugar foods and drinks and refined carbohydrates.  Physical activity remains limited and he is wheelchair-bound.  We discussed Roux-en-Y gastric bypass surgery as a long-term plan to help both his obesity, mobility and type 2 diabetes.

## 2022-12-12 NOTE — Assessment & Plan Note (Signed)
Discussed options with patient.  He has been frustrated by his lack of weight loss in the past 3 months of medically supervised weight management.  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  Unfortunately, his insurance denied coverage for [facility] despite a previous A1c of 6.7.  We had a discussion about bariatric surgery options.  He is strongly considering Roux-en-Y gastric bypass surgery.  This would provide him quicker weight loss along with a low calorie high-protein/low carbohydrate diet.  Wound healing should improve with weight reduction.  He seeks improvement in his functionality and quality of life.  Referral placed to [facility] surgery.

## 2022-12-12 NOTE — Assessment & Plan Note (Signed)
Last vitamin D Lab Results  Component Value Date   VD25OH 21.1 (L) 09/21/2022   He remains on prescription vitamin D 50,000 IU once weekly.  His energy level remains low.  Refilled his vitamin D 50,000 IU once weekly.  A repeat vitamin D level will be due in the next month.  He will have this rechecked on his pathway towards bariatric surgery.  I am happy to see him back pre or postop.

## 2022-12-12 NOTE — Assessment & Plan Note (Signed)
He is seeing psych and counseling.  He reports good compliance taking Cymbalta 60 mg daily and trazodone 200 mg at bedtime.  His wife is supportive and here for his visit today.  He feels that his inability to see adequate weight loss and chronic left foot wounds which have left him wheelchair-bound has contributed more to his depression.  This has served as a barrier to his progress.  He is considering weight loss surgery and we did discuss a preop psych eval as part of his pathway.  Continue current medications and follow-up with psychiatry as scheduled.

## 2022-12-12 NOTE — Addendum Note (Signed)
Addended by: Seymour Bars E on: 12/12/2022 12:13 PM   Modules accepted: Level of Service

## 2022-12-12 NOTE — Progress Notes (Signed)
Office: [phone]  /  Fax: [phone]  WEIGHT SUMMARY AND BIOMETRICS  Starting Date: 09/07/22  Starting Weight: 307lb   Weight Lost Since Last Visit: 1lb   Vitals Temp: 98.4 F (36.9 C) BP: 136/88 Pulse Rate: 73 SpO2: 91 %   No data recorded   HPI  Chief Complaint: OBESITY  [name] is here to discuss his progress with his obesity treatment plan. He is on the the Category 4 Plan and states he is following his eating plan approximately 25-30 % of the time. He states he is exercising 0 minutes 0 times per week.   Interval History:  Since last office visit he is down 1 lb This gives him a net weight loss of 1 lb in 3 mos of medically supervised weight management. He was started on Mounjaro 2.5 mg last visit but his insurance denied it after his first 4 weeks He was starting to feel improved satiety without adverse SE He is growing frustrated by his lack of progress with an ultimate goal to heal his chronic left foot wounds, get back to normal physical activity and to get back to work His inability to do the things that he wants to do has worsened his depression and it has been harder to follow his meal plan when feeling depressed He is in counseling and see psych  Pharmacotherapy: Mounjaro 2.5 mg weekly-- ran out  PHYSICAL EXAM:  Blood pressure 136/88, pulse 73, temperature 98.4 F (36.9 C), height 5\' 8"  (1.727 m), weight (!) 307 lb (139.3 kg), SpO2 91 %. Body mass index is 46.68 kg/m.  General: He is overweight, cooperative, alert, well developed, and in no acute distress. PSYCH: Has normal mood, affect and thought process.   Lungs: Normal breathing effort, no conversational dyspnea.   ASSESSMENT AND PLAN  TREATMENT PLAN FOR OBESITY:  Recommended Dietary Goals  [name] is currently in the action stage of change. As such, his goal is to continue weight management plan. He has agreed to practicing portion control and making smarter food choices, such as  increasing vegetables and decreasing simple carbohydrates.  Behavioral Intervention  We discussed the following Behavioral Modification Strategies today: increasing lean protein intake, decreasing simple carbohydrates , increasing vegetables, increasing lower glycemic fruits, increasing water intake, reading food labels , work on managing stress, creating time for self-care and relaxation measures, avoiding temptations and identifying enticing environmental cues, continue to practice mindfulness when eating, and planning for success.  Additional resources provided today: NA  Recommended Physical Activity Goals  [name] has been advised to work up to 150 minutes of moderate intensity aerobic activity a week and strengthening exercises 2-3 times per week for cardiovascular health, weight loss maintenance and preservation of muscle mass.   He has agreed to Think about ways to increase physical activity  Pharmacotherapy changes for the treatment of obesity: none  ASSOCIATED CONDITIONS ADDRESSED TODAY  Type 2 diabetes mellitus without complication, without long-term current use of insulin (HCC) Assessment & Plan: His highest A1c was 6.7.  He is currently sitting in the prediabetic range with his A1c.  He has room for improvement with dietary adherence.  He is trying to avoid high sugar foods and drinks.  His insurance denied coverage for [name]  Continue working on a reduced calorie diet high in lean protein and dietary fiber.  Avoid high sugar foods and drinks and refined carbohydrates.  Physical activity remains limited and he is wheelchair-bound.  We discussed Roux-en-Y gastric bypass surgery as a long-term plan  to help both his obesity, mobility and type 2 diabetes.   Vitamin D deficiency Assessment & Plan: Last vitamin D Lab Results  Component Value Date   VD25OH 21.1 (L) 09/21/2022   He remains on prescription vitamin D 50,000 IU once weekly.  His energy level remains low.  Refilled  his vitamin D 50,000 IU once weekly.  A repeat vitamin D level will be due in the next month.  He will have this rechecked on his pathway towards bariatric surgery.  I am happy to see him back pre or postop.  Orders: -     Vitamin D (Ergocalciferol); Take 1 capsule (50,000 Units total) by mouth every 7 (seven) days.  Dispense: 5 capsule; Refill: 0  Morbid obesity (HCC) Assessment & Plan: Discussed options with patient.  He has been frustrated by his lack of weight loss in the past 3 months of medically supervised weight management.  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  Unfortunately, his insurance denied coverage for [name] despite a previous A1c of 6.7.  We had a discussion about bariatric surgery options.  He is strongly considering Roux-en-Y gastric bypass surgery.  This would provide him quicker weight loss along with a low calorie high-protein/low carbohydrate diet.  Wound healing should improve with weight reduction.  He seeks improvement in his functionality and quality of life.  Referral placed to [name] surgery.  Orders: -     Ambulatory referral to General Surgery  BMI 45.0-49.9, adult (HCC)  Major depressive disorder in partial remission, unspecified whether recurrent [name]) Assessment & Plan: He is seeing psych and counseling.  He reports good compliance taking Cymbalta 60 mg daily and trazodone 200 mg at bedtime.  His wife is supportive and here for his visit today.  He feels that his inability to see adequate weight loss and chronic left foot wounds which have left him wheelchair-bound has contributed more to his depression.  This has served as a barrier to his progress.  He is considering weight loss surgery and we did discuss a preop psych eval as part of his pathway.  Continue current medications and follow-up with psychiatry as  scheduled.       He was informed of the importance of frequent follow up visits to maximize his success with intensive lifestyle modifications for his multiple health conditions.   ATTESTASTION STATEMENTS:  Reviewed by clinician on day of visit: allergies, medications, problem list, medical history, surgical history, family history, social history, and previous encounter notes pertinent to obesity diagnosis.   I have personally spent 30 minutes total time today in preparation, patient care, nutritional counseling and documentation for this visit, including the following: review of clinical lab tests; review of medical tests/procedures/services.      Glennis Brink, DO DABFM, DABOM [facility] [address] [place], [place] [address] [phone]

## 2022-12-15 ENCOUNTER — Encounter (HOSPITAL_BASED_OUTPATIENT_CLINIC_OR_DEPARTMENT_OTHER): Payer: PPO | Admitting: General Surgery

## 2022-12-15 DIAGNOSIS — L97522 Non-pressure chronic ulcer of other part of left foot with fat layer exposed: Secondary | ICD-10-CM | POA: Diagnosis not present

## 2022-12-15 DIAGNOSIS — L89623 Pressure ulcer of left heel, stage 3: Secondary | ICD-10-CM | POA: Diagnosis not present

## 2022-12-22 ENCOUNTER — Encounter (HOSPITAL_BASED_OUTPATIENT_CLINIC_OR_DEPARTMENT_OTHER): Payer: PPO | Admitting: General Surgery

## 2022-12-22 DIAGNOSIS — L89623 Pressure ulcer of left heel, stage 3: Secondary | ICD-10-CM | POA: Diagnosis not present

## 2022-12-22 DIAGNOSIS — L97522 Non-pressure chronic ulcer of other part of left foot with fat layer exposed: Secondary | ICD-10-CM | POA: Diagnosis not present

## 2022-12-23 DIAGNOSIS — R7303 Prediabetes: Secondary | ICD-10-CM | POA: Diagnosis not present

## 2022-12-23 DIAGNOSIS — J45909 Unspecified asthma, uncomplicated: Secondary | ICD-10-CM | POA: Diagnosis not present

## 2022-12-23 DIAGNOSIS — Z6841 Body Mass Index (BMI) 40.0 and over, adult: Secondary | ICD-10-CM | POA: Diagnosis not present

## 2022-12-23 DIAGNOSIS — E559 Vitamin D deficiency, unspecified: Secondary | ICD-10-CM | POA: Diagnosis not present

## 2022-12-23 DIAGNOSIS — I10 Essential (primary) hypertension: Secondary | ICD-10-CM | POA: Diagnosis not present

## 2022-12-29 ENCOUNTER — Encounter (HOSPITAL_BASED_OUTPATIENT_CLINIC_OR_DEPARTMENT_OTHER): Payer: PPO | Admitting: General Surgery

## 2022-12-30 ENCOUNTER — Ambulatory Visit (HOSPITAL_BASED_OUTPATIENT_CLINIC_OR_DEPARTMENT_OTHER): Payer: PPO | Admitting: General Surgery

## 2023-01-03 ENCOUNTER — Other Ambulatory Visit (INDEPENDENT_AMBULATORY_CARE_PROVIDER_SITE_OTHER): Payer: Self-pay | Admitting: Family Medicine

## 2023-01-03 DIAGNOSIS — E559 Vitamin D deficiency, unspecified: Secondary | ICD-10-CM

## 2023-01-05 ENCOUNTER — Encounter (HOSPITAL_BASED_OUTPATIENT_CLINIC_OR_DEPARTMENT_OTHER): Payer: PPO | Admitting: General Surgery

## 2023-01-05 DIAGNOSIS — L89623 Pressure ulcer of left heel, stage 3: Secondary | ICD-10-CM | POA: Diagnosis not present

## 2023-01-05 DIAGNOSIS — L97522 Non-pressure chronic ulcer of other part of left foot with fat layer exposed: Secondary | ICD-10-CM | POA: Diagnosis not present

## 2023-01-11 ENCOUNTER — Other Ambulatory Visit (INDEPENDENT_AMBULATORY_CARE_PROVIDER_SITE_OTHER): Payer: Self-pay | Admitting: Family Medicine

## 2023-01-11 DIAGNOSIS — E559 Vitamin D deficiency, unspecified: Secondary | ICD-10-CM

## 2023-01-12 ENCOUNTER — Encounter (HOSPITAL_BASED_OUTPATIENT_CLINIC_OR_DEPARTMENT_OTHER): Payer: PPO | Attending: General Surgery | Admitting: General Surgery

## 2023-01-12 DIAGNOSIS — L8993 Pressure ulcer of unspecified site, stage 3: Secondary | ICD-10-CM | POA: Diagnosis not present

## 2023-01-12 DIAGNOSIS — L89623 Pressure ulcer of left heel, stage 3: Secondary | ICD-10-CM | POA: Diagnosis not present

## 2023-01-12 DIAGNOSIS — L97512 Non-pressure chronic ulcer of other part of right foot with fat layer exposed: Secondary | ICD-10-CM | POA: Insufficient documentation

## 2023-01-12 DIAGNOSIS — L97522 Non-pressure chronic ulcer of other part of left foot with fat layer exposed: Secondary | ICD-10-CM | POA: Diagnosis not present

## 2023-01-12 DIAGNOSIS — Z833 Family history of diabetes mellitus: Secondary | ICD-10-CM | POA: Insufficient documentation

## 2023-01-12 DIAGNOSIS — I10 Essential (primary) hypertension: Secondary | ICD-10-CM | POA: Diagnosis not present

## 2023-01-12 DIAGNOSIS — L299 Pruritus, unspecified: Secondary | ICD-10-CM | POA: Diagnosis not present

## 2023-01-12 DIAGNOSIS — J45909 Unspecified asthma, uncomplicated: Secondary | ICD-10-CM | POA: Diagnosis not present

## 2023-01-17 ENCOUNTER — Ambulatory Visit: Payer: PPO | Admitting: Allergy & Immunology

## 2023-01-17 NOTE — Progress Notes (Signed)
JUDAS, PETTITT ([id number]) [id number] Page 1 of 7 Visit Report for 12/22/2022 Arrival Information Details Patient Name: Date of Service: [name], TO [name] 12/22/2022 9:45 A M Medical Record Number: [id number] Patient Account Number: [id number] Date of Birth/Sex: Treating RN: 1976-06-09 (47 y.o. M) Primary Care Juliany Daughety: Dorinda Hill Other Clinician: Referring Birda Didonato: Treating Trevonne Nyland/Extender: Jana Hakim in Treatment: 127 Visit Information History Since Last Visit All ordered tests and consults were completed: No Patient Arrived: Wheel Chair Added or deleted any medications: No Arrival Time: 09:41 Any new allergies or adverse reactions: No Accompanied By: wife Had a fall or experienced change in No Transfer Assistance: None activities of daily living that may affect Patient Identification Verified: Yes risk of falls: Secondary Verification Process Completed: Yes Signs or symptoms of abuse/neglect since last visito No Patient Requires Transmission-Based Precautions: No Hospitalized since last visit: No Patient Has Alerts: No Implantable device outside of the clinic excluding No cellular tissue based products placed in the center since last visit: Pain Present Now: No Electronic Signature(s) Signed: 12/22/2022 9:55:36 AM By: Dayton Scrape Entered By: Dayton Scrape on 12/22/2022 09:42:01 -------------------------------------------------------------------------------- Encounter Discharge Information Details Patient Name: Date of Service: [name], TO [name] 12/22/2022 9:45 A M Medical Record Number: [id number] Patient Account Number: [id number] Date of Birth/Sex: Treating RN: 10/30/1975 (47 y.o. [name] Primary Care Glorian Mcdonell: Dorinda Hill Other Clinician: Referring Merton Wadlow: Treating Braelyn Bordonaro/Extender: Jana Hakim in Treatment: 127 Encounter Discharge Information Items Post Procedure  Vitals Discharge Condition: Stable Temperature (F): 98.2 Ambulatory Status: Ambulatory Pulse (bpm): 82 Discharge Destination: Home Respiratory Rate (breaths/min): 18 Transportation: Private Auto Blood Pressure (mmHg): 140/64 Accompanied By: self Schedule Follow-up Appointment: Yes Clinical Summary of Care: Patient Declined Electronic Signature(s) Signed: 01/17/2023 7:49:05 AM By: Brenton Grills Entered By: Brenton Grills on 12/22/2022 10:34:09 -------------------------------------------------------------------------------- Lower Extremity Assessment Details Patient Name: Date of Service: [name], TO [name] 12/22/2022 9:45 A M Medical Record Number: [id number] Patient Account Number: [id number] Date of Birth/Sex: Treating RN: Apr 26, 1976 (47 y.o. [name] Primary Care Laporscha Linehan: Dorinda Hill Other Clinician: Referring Ma Munoz: Treating Benigno Check/Extender: Jana Hakim in Treatment: 127 Edema Assessment Assessed: [name]: No] Franne Forts: No] G[LeftJeanice Lim ([id number])] [Right: [id number] Page 2 of 7] Edema: [Left: N] [Right: o] Calf Left: Right: Point of Measurement: 29 cm From Medial Instep 42.8 cm Ankle Left: Right: Point of Measurement: 9 cm From Medial Instep 25 cm Vascular Assessment Pulses: Dorsalis Pedis Palpable: [Left:Yes] Electronic Signature(s) Signed: 01/17/2023 7:49:05 AM By: Brenton Grills Entered By: Brenton Grills on 12/22/2022 09:59:57 -------------------------------------------------------------------------------- Multi Wound Chart Details Patient Name: Date of Service: [name], [name] 12/22/2022 9:45 A M Medical Record Number: [id number] Patient Account Number: [id number] Date of Birth/Sex: Treating RN: 12/24/1975 (47 y.o. M) Primary Care Tinesha Siegrist: Dorinda Hill Other Clinician: Referring Renso Swett: Treating Jolena Kittle/Extender: Jana Hakim in Treatment: 127 Vital  Signs Height(in): 69 Pulse(bpm): 80 Weight(lbs): 280 Blood Pressure(mmHg): 123/80 Body Mass Index(BMI): 41.3 Temperature(F): 98.0 Respiratory Rate(breaths/min): 20 [2:Photos:] [N/A:N/A] Left Calcaneus N/A N/A Wound Location: Pressure Injury N/A N/A Wounding Event: Pressure Ulcer N/A N/A Primary Etiology: Asthma, Angina, Hypertension N/A N/A Comorbid History: 10/07/2019 N/A N/A Date Acquired: 127 N/A N/A Weeks of Treatment: Open N/A N/A Wound Status: No N/A N/A Wound Recurrence: 5x1.5x0.3 N/A N/A Measurements L x W x D (cm) 5.89 N/A N/A A (cm) : rea 1.767 N/A N/A Volume (cm) : 78.30% N/A N/A % Reduction in A rea: 93.50%  N/A N/A % Reduction in Volume: Category/Stage III N/A N/A Classification: Medium N/A N/A Exudate A mount: Serosanguineous N/A N/A Exudate Type: red, brown N/A N/A Exudate Color: Distinct, outline attached N/A N/A Wound Margin: Medium (34-66%) N/A N/A Granulation A mount: Red, Pink N/A N/A Granulation Quality: Medium (34-66%) N/A N/A Necrotic A mount: Fat Layer (Subcutaneous Tissue): Yes N/A N/A Exposed Structures: Fascia: No Tendon: No Muscle: No [name], [name] ([id number]) [id number] Page 3 of 7 Joint: No Bone: No Debridement - Selective/Open Wound N/A N/A Debridement: Pre-procedure Verification/Time Out 10:10 N/A N/A Taken: Lidocaine 4% Topical Solution N/A N/A Pain Control: Slough N/A N/A Tissue Debrided: Skin/Epidermis N/A N/A Level: 5.89 N/A N/A Debridement A (sq cm): rea Curette N/A N/A Instrument: Minimum N/A N/A Bleeding: Pressure N/A N/A Hemostasis A chieved: 0 N/A N/A Procedural Pain: 0 N/A N/A Post Procedural Pain: Procedure was tolerated well N/A N/A Debridement Treatment Response: 5x1.5x0.3 N/A N/A Post Debridement Measurements L x W x D (cm) 1.767 N/A N/A Post Debridement Volume: (cm) Category/Stage III N/A N/A Post Debridement Stage: Excoriation: No N/A N/A Periwound  Skin Texture: Induration: No Callus: No Crepitus: No Rash: No Scarring: No Dry/Scaly: Yes N/A N/A Periwound Skin Moisture: Maceration: No Hemosiderin Staining: Yes N/A N/A Periwound Skin Color: Atrophie Blanche: No Cyanosis: No Ecchymosis: No Erythema: No Mottled: No Pallor: No Rubor: No No Abnormality N/A N/A Temperature: Debridement N/A N/A Procedures Performed: Treatment Notes Wound #2 (Calcaneus) Wound Laterality: Left Cleanser Normal Saline Discharge Instruction: Cleanse the wound with Normal Saline prior to applying a clean dressing using gauze sponges, not tissue or cotton balls. Wound Cleanser Discharge Instruction: Cleanse the wound with wound cleanser prior to applying a clean dressing using gauze sponges, not tissue or cotton balls. Peri-Wound Care Zinc Oxide Ointment 30g tube Discharge Instruction: Apply Zinc Oxide to periwound with each dressing change Topical Gentamicin Discharge Instruction: hold compounding topical antibiotics from [facility] pharmacy Discharge Instruction: HOLD Primary Dressing Maxorb Extra Ag+ Alginate Dressing, 4x4.75 (in/in) Discharge Instruction: Apply to wound bed as instructed Secondary Dressing Drawtex 4x4 in Discharge Instruction: Apply over primary dressing as directed. Optifoam Non-Adhesive Dressing, 4x4 in Discharge Instruction: Apply over primary dressing as directed. Woven Gauze Sponge, Non-Sterile 4x4 in Discharge Instruction: Apply over primary dressing as directed. Secured With 28M Medipore H Soft Cloth Surgical T ape, 4 x 10 (in/yd) Discharge Instruction: Secure with tape as directed. Compression Wrap Kerlix Roll 4.5x3.1 (in/yd) Discharge Instruction: Apply Kerlix and Coban compression as directed. [name], [name] ([id number]) [id number] Page 4 of 7 Compression Stockings Add-Ons [place] [facility] Offloading Boot, Size Regular Electronic Signature(s) Signed: 12/22/2022 12:17:45 PM By:  [name] MD FACS Entered By: [name] on 12/22/2022 12:17:45 -------------------------------------------------------------------------------- Multi-Disciplinary Care Plan Details Patient Name: Date of Service: [name], TO [name] 12/22/2022 9:45 A M Medical Record Number: [id number] Patient Account Number: [id number] Date of Birth/Sex: Treating RN: [birth year] (47 y.o. [name] Primary Care Erika Slaby: Dorinda Hill Other Clinician: Referring Kearia Yin: Treating Elanora Quin/Extender: Jana Hakim in Treatment: 127 Multidisciplinary Care Plan reviewed with physician Active Inactive Wound/Skin Impairment Nursing Diagnoses: Impaired tissue integrity Knowledge deficit related to ulceration/compromised skin integrity Goals: Patient/caregiver will verbalize understanding of skin care regimen Date Initiated: 07/15/2020 Target Resolution Date: 05/08/2023 Goal Status: Active Ulcer/skin breakdown will have a volume reduction of 30% by week 4 Date Initiated: 07/15/2020 Date Inactivated: 08/20/2020 Target Resolution Date: 09/03/2020 Goal Status: Unmet Unmet Reason: no major changes. Ulcer/skin breakdown will heal within 14 weeks Date Initiated: 12/04/2020 Date  Inactivated: 12/10/2020 Target Resolution Date: 12/10/2020 Unmet Reason: wounds still open at 14 Goal Status: Unmet weeks and today 21 weeks. Interventions: Assess patient/caregiver ability to obtain necessary supplies Assess patient/caregiver ability to perform ulcer/skin care regimen upon admission and as needed Assess ulceration(s) every visit Provide education on ulcer and skin care Treatment Activities: Skin care regimen initiated : 07/15/2020 Topical wound management initiated : 07/15/2020 Notes: Electronic Signature(s) Signed: 01/17/2023 7:49:05 AM By: Brenton Grills Entered By: Brenton Grills on 12/22/2022  10:02:04 -------------------------------------------------------------------------------- Pain Assessment Details Patient Name: Date of Service: [name], [name] 12/22/2022 9:45 A M Medical Record Number: [id number] Patient Account Number: [id number] Date of Birth/Sex: Treating RN: 12/11/1975 (47 y.o. M) Primary Care Taray Normoyle: Dorinda Hill Other Clinician: Referring Mclane Arora: Treating Orry Sigl/Extender: Jana Hakim in Treatment: [address] [name], [name] ([id number]) [id number] Page 5 of 7 Location of Pain Severity and Description of Pain Patient Has Paino No Site Locations Pain Management and Medication Current Pain Management: Electronic Signature(s) Signed: 12/22/2022 9:55:36 AM By: Dayton Scrape Entered By: Dayton Scrape on 12/22/2022 09:42:45 -------------------------------------------------------------------------------- Patient/Caregiver Education Details Patient Name: Date of Service: [name] 5/16/2024andnbsp9:45 A M Medical Record Number: [id number] Patient Account Number: [id number] Date of Birth/Gender: Treating RN: [birth year] (47 y.o. [name] Primary Care Physician: Dorinda Hill Other Clinician: Referring Physician: Treating Physician/Extender: Jana Hakim in Treatment: 127 Education Assessment Education Provided To: Patient and Caregiver Education Topics Provided Wound/Skin Impairment: Methods: Explain/Verbal Responses: State content correctly Electronic Signature(s) Signed: 01/17/2023 7:49:05 AM By: Brenton Grills Entered By: Brenton Grills on 12/22/2022 10:02:25 -------------------------------------------------------------------------------- Wound Assessment Details Patient Name: Date of Service: [name], TO [name] 12/22/2022 9:45 A M Medical Record Number: [id number] Patient Account Number: [id number] Date of Birth/Sex: Treating RN: 11/19/1975 (47 y.o.  M) Primary Care Etheline Geppert: Dorinda Hill Other Clinician: Referring Boss Danielsen: Treating Elaine Middleton/Extender: Jana Hakim in Treatment: 127 Wound Status Wound Number: 2 Primary Etiology: Pressure Ulcer Wound Location: Left Calcaneus Wound Status: Open [name], [name] ([id number]) [id number] Page 6 of 7 Wounding Event: Pressure Injury Comorbid History: Asthma, Angina, Hypertension Date Acquired: 10/07/2019 Weeks Of Treatment: 127 Clustered Wound: No Photos Wound Measurements Length: (cm) 5 Width: (cm) 1.5 Depth: (cm) 0.3 Area: (cm) 5.89 Volume: (cm) 1.767 % Reduction in Area: 78.3% % Reduction in Volume: 93.5% Tunneling: No Undermining: No Wound Description Classification: Category/Stage III Wound Margin: Distinct, outline attached Exudate Amount: Medium Exudate Type: Serosanguineous Exudate Color: red, brown Foul Odor After Cleansing: No Slough/Fibrino No Wound Bed Granulation Amount: Medium (34-66%) Exposed Structure Granulation Quality: Red, Pink Fascia Exposed: No Necrotic Amount: Medium (34-66%) Fat Layer (Subcutaneous Tissue) Exposed: Yes Necrotic Quality: Adherent Slough Tendon Exposed: No Muscle Exposed: No Joint Exposed: No Bone Exposed: No Periwound Skin Texture Texture Color No Abnormalities Noted: No No Abnormalities Noted: No Callus: No Atrophie Blanche: No Crepitus: No Cyanosis: No Excoriation: No Ecchymosis: No Induration: No Erythema: No Rash: No Hemosiderin Staining: Yes Scarring: No Mottled: No Pallor: No Moisture Rubor: No No Abnormalities Noted: No Dry / Scaly: Yes Temperature / Pain Maceration: No Temperature: No Abnormality Electronic Signature(s) Signed: 01/17/2023 7:49:05 AM By: Brenton Grills Previous Signature: 12/22/2022 9:55:36 AM Version By: Dayton Scrape Entered By: Brenton Grills on 12/22/2022  10:00:44 -------------------------------------------------------------------------------- Vitals Details Patient Name: Date of Service: [name], [name] 12/22/2022 9:45 A M Medical Record Number: [id number] Patient Account Number: [id number] Date of Birth/Sex: Treating RN: 01-Sep-1975 (47 y.o. M) Primary Care Renly Roots: Nadene Rubins,  Vernona Rieger Other Clinician: Referring Arlita Buffkin: Treating Tamryn Popko/Extender: Jana Hakim in Treatment: [address] ([id number]) [id number] Page 7 of 7 Vital Signs Time Taken: 09:42 Temperature (F): 98.0 Height (in): 69 Pulse (bpm): 80 Weight (lbs): 280 Respiratory Rate (breaths/min): 20 Body Mass Index (BMI): 41.3 Blood Pressure (mmHg): 123/80 Reference Range: 80 - 120 mg / dl Electronic Signature(s) Signed: 12/22/2022 9:55:36 AM By: Dayton Scrape Entered By: Dayton Scrape on 12/22/2022 09:42:38

## 2023-01-17 NOTE — Progress Notes (Signed)
[name], [name] ([id number]) [id number] Page 1 of 16 Visit Report for 12/22/2022 Chief Complaint Document Details Patient Name: Date of Service: [name], TO [name] 12/22/2022 9:45 A M Medical Record Number: [id number] Patient Account Number: [id number] Date of Birth/Sex: Treating RN: 05/04/1976 (47 y.o. M) Primary Care Provider: Dorinda Hill Other Clinician: Referring Provider: Treating Provider/Extender: Jana Hakim in Treatment: 127 Information Obtained from: Patient Chief Complaint Bilateral Plantar Foot Ulcers Electronic Signature(s) Signed: 12/22/2022 12:17:57 PM By: [name] MD FACS Entered By: [name] on 12/22/2022 12:17:57 -------------------------------------------------------------------------------- Debridement Details Patient Name: Date of Service: [name], [name] 12/22/2022 9:45 A M Medical Record Number: [id number] Patient Account Number: [id number] Date of Birth/Sex: Treating RN: 06/06/1976 (47 y.o. [name] Primary Care Provider: Dorinda Hill Other Clinician: Referring Provider: Treating Provider/Extender: Jana Hakim in Treatment: 127 Debridement Performed for Assessment: Wound #2 Left Calcaneus Performed By: Physician [name], MD Debridement Type: Debridement Level of Consciousness (Pre-procedure): Awake and Alert Pre-procedure Verification/Time Out Yes - 10:10 Taken: Start Time: 10:12 Pain Control: Lidocaine 4% T opical Solution Percent of Wound Bed Debrided: 100% T Area Debrided (cm): otal 5.89 Tissue and other material debrided: Viable, Non-Viable, Slough, Skin: Dermis , Skin: Epidermis, Slough Level: Skin/Epidermis Debridement Description: Selective/Open Wound Instrument: Curette Bleeding: Minimum Hemostasis Achieved: Pressure End Time: 10:15 Procedural Pain: 0 Post Procedural Pain: 0 Response to Treatment: Procedure was tolerated well Level  of Consciousness (Post- Awake and Alert procedure): Post Debridement Measurements of Total Wound Length: (cm) 5 Stage: Category/Stage III Width: (cm) 1.5 Depth: (cm) 0.3 Volume: (cm) 1.767 Character of Wound/Ulcer Post Debridement: Improved Post Procedure Diagnosis Same as Pre-procedure Notes Scribed for Dr Lady Gary by Brenton Grills RN. Electronic Signature(s) Signed: 12/22/2022 4:03:18 PM By: [name] MD [name] ([id number]) [id number] Page 2 of 16 Signed: 01/17/2023 7:49:05 AM By: Brenton Grills Entered By: Brenton Grills on 12/22/2022 10:17:51 -------------------------------------------------------------------------------- HPI Details Patient Name: Date of Service: [name], [name] 12/22/2022 9:45 A M Medical Record Number: [id number] Patient Account Number: [id number] Date of Birth/Sex: Treating RN: [birth year] (47 y.o. M) Primary Care Provider: Dorinda Hill Other Clinician: Referring Provider: Treating Provider/Extender: Jana Hakim in Treatment: 127 History of Present Illness HPI Description: Wounds are12/03/2020 upon evaluation today patient presents for initial inspection here in our clinic concerning issues he has been having with the bottoms of his feet bilaterally. He states these actually occurred as wounds when he was hospitalized for 5 months secondary to Covid. He was apparently with tilting bed where he was in an upright position quite frequently and apparently this occurred in some way shape or form during that time. Fortunately there is no sign of active infection at this time. No fevers, chills, nausea, vomiting, or diarrhea. With that being said he still has substantial wounds on the plantar aspects of his feet Theragen require quite a bit of work to get these to heal. He has been using Santyl currently though that is been problematic both in receiving the medication as well as actually paid for it  as it is become quite expensive. Prior to the experience with Covid the patient really did not have any major medical problems other than hypertension he does have some mild generalized weakness following the Covid experience. 07/22/2020 on evaluation today patient appears to be doing okay in regard to his foot ulcers I feel like the wound beds are showing signs of better improvement that I do  believe the Iodoflex is helping in this regard. With that being said he does have a lot of drainage currently and this is somewhat blue/green in nature which is consistent with Pseudomonas. I do think a culture today would be appropriate for [place] to evaluate and see if that is indeed the case I would likely start him on antibiotic orally as well he is not allergic to Cipro knows of no issues he has had in the past 12/21; patient was admitted to the clinic earlier this month with bilateral presumed pressure ulcers on the bottom of his feet apparently related to excessive pressure from a tilt table arrangement in the intensive care unit. Patient relates this to being on ECMO but I am not really sure that is exactly related to that. I must say I have never seen anything like this. He has fairly extensive full-thickness wounds extending from his heel towards his midfoot mostly centered laterally. There is already been some healing distally. He does not appear to have an arterial issue. He has been using gentamicin to the wound surfaces with Iodoflex to help with ongoing debridement 1/6; this is a patient with pressure ulcers on the bottom of his feet related to excessive pressure from a standing position in the intensive care unit. He is complaining of a lot of pain in the right heel. He is not a diabetic. 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 We have been using Iodoflex to help prepare the surfaces of both wounds for an advanced treatment product. He is nonambulatory spending most of his  time in a wheelchair I have asked him not to propel the wheelchair with his heels 1/13; in general his wounds look better not much surface area change we have been using Iodoflex as of last week. I did an x-ray of the right heel as the patient was complaining of pain. I had some thoughts about a stress fracture perhaps Achilles tendon problems however what it showed was erosive changes along the inferior aspect of the calcaneus he now has a MRI booked for 1/20. 1/20; in general his wounds continue to be better. Some improvement in the large narrow areas proximally in his foot. He is still complaining of pain in the right heel and tenderness in certain areas of this wound. His MRI is tonight. I am not just looking for osteomyelitis that was brought up on the x-ray I am wondering about stress fractures, tendon ruptures etc. He has no such findings on the left. Also noteworthy is that the patient had critical illness neuropathy and some of the discomfort may be actual improvement in nerve function I am just not sure. These wounds were initially in the setting of severe critical illness related to COVID-19. He was put in a standing position. He may have also been on pressors at the point contributing to tissue ischemia. By his description at some point these wounds were grossly necrotic extending proximally up into the Achilles part of his heel. I do not know that I have ever really seen pictures of them like this although they may exist in epic We have ordered Tri layer Oasis. I am trying to stimulate some granulation in these areas. This is of course assuming the MRI is negative for infection 1/27; since the patient was last here he saw Dr. Earlene Plater of infectious disease. He is planned for vancomycin and ceftriaxone. Prior operative culture grew MSSA. Also ordered baseline lab work. He also ordered arterial studies although the  ABIs in our clinic were normal as well as his clinical exam these were normal  I do not think he needs to see vascular surgery. His ABIs at the [facility] were 1.22 in the right triphasic waveforms with a normal TBI of 1.15 on the left ABI of 1.22 with triphasic waveforms and a normal TBI of 1.08. Finally he saw Dr. Logan Bores who will follow him in for 2 months. At this point I do not think he felt that he needed a procedure on the right calcaneal bone. Dr. Earlene Plater is elected for broad-spectrum antibiotic The patient is still having pain in the right heel. He walks with a walker 2/3; wounds are generally smaller. He is tolerating his IV antibiotics. I believe this is vancomycin and ceftriaxone. We are still waiting for Oasis burn in terms of his out-of-pocket max which he should be meeting soon given the IV antibiotics, MRIs etc. I have asked him to check in on this. We are using silver collagen in the meantime the wounds look better 2/10; tolerating IV vancomycin and Rocephin. We are waiting to apply for Oasis. Although I am not really sure where he is in his out-of-pocket max. 2/17 started the first application of Oasis trilayer. Still on antibiotics. The wounds have generally look better. The area on the left has a little more surface slough requiring debridement 2/24; second application of Oasis trilayer. The wound surface granulation is generally look better. The area on the left with undermining laterally I think is come in a bit. 10/08/2020 upon evaluation today patient is here today for [employer] application #3. Fortunately he seems to be doing extremely well with regard to this and we are seeing a lot of new epithelial growth which is great news. Fortunately there is no signs of active infection at this time. 10/16/2020 upon evaluation today patient appears to be doing well with regard to his foot ulcers. Do believe the Oasis has been of benefit for him. I do not see any signs of infection right now which is great news and I think that he has a lot of new epithelial growth  which is great to see as well. The patient is very pleased to hear all of this. I do think we can proceed with the Oasis trilayer #4 today. 3/18; not as much improvement in these areas on his heels that I was hoping. I did reapply trilateral Oasis today the tissue looks healthier but not as much fill in as I was hoping. 3/25; better looking today I think this is come in a bit the tissue looks healthier. Triple layer Oasis reapplied #6 4/1; somewhat better looking definitely better looking surface not as much change in surface area as I was hoping. He may be spending more time [place] on days then he needs to although he does have heel offloading boots. Triple layer Oasis reapplied EDUARD, PASQUALONE ([id number]) [id number] Page 3 of 16 4/7; unfortunately apparently [employer] will not approve any further Oasis which is unfortunate since the patient did respond nicely both in terms of the condition of the wound bed as well as surface area. There is still some drainage coming from the wound but not a lot there does not appear to be any infection 4/15; we have been using Hydrofera Blue. He continues to have nice rims of epithelialization on the right greater than the left. The left the epithelialization is coming from the tip of his heel. There is moderate drainage. In  this that concerns me about a total contact cast. There is no evidence of infection 4/29; patient has been using Hydrofera Blue with dressing changes. He has no complaints or issues today. 5/5; using Hydrofera Blue. I actually think that he looks marginally better than the last time I saw this 3 weeks ago. There are rims of epithelialization on the left thumb coming from the medial side on the right. Using Hydrofera Blue 5/12; using Hydrofera Blue. These continue to make improvements in surface area. His drainage was not listed as severe I therefore went ahead and put a cast on the left foot. Right  foot we will continue to dress his previous 5/16; back for first total contact cast change. He did not tolerate this particularly well cast injury on the anterior tibia among other issues. Difficulty sleeping. I talked him about this in some detail and afterwards is elected to continue. I told him I would like to have a cast on for 3 weeks to see if this is going to help at all. I think he agreed 5/19; I think the wound is better. There is no tunneling towards his midfoot. The undermining medially also looks better. He has a rim of new skin distally. I think we are making progress here. The area on the left also continues to look somewhat better to me using Hydrofera Blue. He has a list of complaints about the cast but none of them seem serious 5/26; patient presents for 1 week follow-up. He has been using a total contact cast and tolerating this well. Hydrofera Blue is the main dressing used. He denies signs of infection. 6/2 Hydrofera Blue total contact cast on the left. These were large ulcers that formed in intensive care unit where the patient was recovering from COVID. May have had something to do with being ventilated in an upright positiono Pressors etc. We have been able to get the areas down considerably and a viable surface. There is some epithelialization in both sides. Note made of drainage 6/9; changed to [facility] last time because of drainage. He arrives with better looking surfaces and dimensions on the left than the right. Paradoxically the right actually probes more towards his midfoot the left is largely close down but both of these look improved. Using a total contact cast on the left 6/16; complex wounds on his bilateral plantar heels which were initially pressure injury from a stay in the ICU with COVID. We have been using silver alginate most recently. His dimensions of come in quite dramatically however not recently. We have been putting the left foot in a total contact  cast 6/23; complex wounds on the bilateral plantar heels. I been putting the left in the cast paradoxically the area on the right is the one that is going towards closure at a faster rate. Quite a bit of drainage on the left. The patient went to see Dr. Logan Bores who said he was going to standby for skin grafts. I had actually considered sending him for skin grafts however he would be mandatorily off his feet for a period of weeks to months. I am thinking that the area on the right is going to close on its own the area on the left has been more stubborn even though we have him in a total contact cast 6/30; took him out of a total contact cast last week is the right heel seem to be making better progress than the left where I was placing the cast. We  are using silver alginate. Both wounds are smaller right greater than left 7/12; both wounds look as though they are making some progress. We are using silver alginate. Heel offloading boots 7/26; very gradual progress especially on the right. Using silver alginate. He is wearing heel offloading boots 8/18; he continues to close these wounds down very gradually. Using silver alginate. The problem polymen being definitive about this is areas of what appears to be callus around the margins. This is not a 100% of the area but certainly sizable especially on the right 9/1; bilateral plantar feet wounds secondary to prolonged pressure while being ventilated for COVID-19 in an upright position. Essentially pressure ulcers on the bottom of his feet. He is made substantial progress using silver alginate. 9/14; bilateral plantar feet wounds secondary to prolonged pressure. Making progress using silver alginate. 9/29 bilateral plantar feet wounds secondary to prolonged pressure. I changed him to Iodoflex last week. MolecuLight showing reddened blush fluorescence 10/11; patient presents for follow-up. He has no issues or complaints today. He denies signs of infection.  He continues to use Iodoflex and antibiotic ointment to the wound beds. 10/27; 2-week follow-up. No evidence of infection. He has callus and thick dry skin around the wound margins we have been using Iodoflex and Bactroban which was in response to a moderate left MolecuLight reddish blush fluorescence. 11/10; 2-week follow-up. Wound margins again have thick callus however the measurements of the actual wound sites are a lot smaller. Everything looks reasonably healthy here. We have been using Iodoflex He was approved for prime matrix but I have elected to delay this given the improvement in the surface area. Hopefully I will not regret that decision as were getting close to the end of the year in terms of insurance payment 12/8; 2-week follow-up. Wounds are generally smaller in size. These were initially substantial wounds extending into the forefoot all the way into the heel on the bilateral plantar feet. They are now both located on the plantar heel distal aspect both of these have a lot of callus around the wounds I used a #5 curette to remove this on the right and the left also some subcutaneous debris to try and get the wound edges were using Iodoflex. He has heel offloading shoe 12/22; 2-week follow-up. Not really much improvement. He has thick callus around the outer edges of both wounds. I remove this there is some nonviable subcutaneous tissue as well. We have been using Iodoflex. Her intake nurse and myself spontaneously thought of a total contact cast I went back in May. At that time we really were not seeing much of an improvement with a cast although the wound was in a much different situation I would like to retry this in 2 weeks and I discussed this with the patient 08/12/2021; the patient has had some improvement with the Iodoflex. The the area on the left heel plantar more improved than the right. I had to put him in a total contact cast on the left although I decided to put that off  for 2 weeks. I am going to change his primary dressing to silver collagen. I think in both areas he has had some improvement most of the healing seems to be more proximal in the heel. The wounds are in the mid aspect. A lot of thick callus on the right heel however. 1/19; we are using silver collagen on both plantar heel areas. He has had some improvement today. The left did not require any debridement.  He still had some eschar on the right that was debrided but both seem to have contracted. I did not put it total contact cast on him today 2/2 we have been using silver collagen. The area on the right plantar heel has areas that appear to be epithelialized interspersed with dry flaking callus and dry skin. I removed this. This really looks better than on the other side. On the left still a large area with raised edges and debris on the surface. 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 He has no issues or complaints today. 2/9; not much change in the left foot wound with 1 week of a cast we are using silver collagen. Silver collagen on the right side. The right side has been the better wound surface. We will reapply the total contact cast on the left 2/16; not much improvement on either side I been using silver collagen with a total contact cast on the left. I'm changing the Hydrofera Blue still with a total contact cast on the left 2/23; some improvement on both sides. Disappointing that he has thick callus around the area that we are putting in a total contact cast on the left. We've been using Hydrofera Blue on both wound areas. This is a man who at essentially pressure ulcers in addition to ischemia caused by medications to support his blood pressure (pressors) in the ICU. He was being ventilated in the standing position for severe Covid. A Shiley the wounds extended  across his entire foot but are now localized to his plantar heels bilaterally. We have made progress however neither areas healed. I continue to think the total contact cast is helped albeit painstakingly slowly. He has never wanted a plastic surgery consult although I don't know that they would be interested in grafting in area in this location. 10/07/2021: Continued improvement bilaterally. There is still some callus around the left wound, despite the total contact cast. He has some increased pain in his right midfoot around 1 particular area. This has been painful in the past but seems to be a little bit worse. When his cast was removed today, there was an area on the heel of the left foot that looks a bit macerated. He is also complaining of pain in his left thigh and hip which he thinks is secondary to the limb length discrepancy caused by the cast. 10/14/2021: He continues to improve. A little bit less callus around the left wound. He continues to endorse pain in his right midfoot, but this is not as significant as it was last week. The maceration on his left heel is improved. GILLIE, ADDARIO ([id number]) [id number] Page 4 of 16 10/21/2021: Continued improvement to both wounds. The maceration on his left heel is no longer evident. Less callus bilaterally. Epithelialization progressing. 10/28/2021: Significant improvement this week. The right sided wound is nearly closed with just a small open area at the middle. No maceration seen on the left heel. Continued epithelialization on both sides. No concern for infection. 11/04/2021: T oday, the wounds were measured a little bit differently and come out as larger, but I actually think they are about the same to potentially even smaller, particularly on the left. He continues to accumulate some callus on the right. 11/11/2021: T oday, the patient is expressing some concern that the left wound, despite being in the total  contact cast,  is not progressing at the same rate as the 1 on the right. He is interested in trying a week without the cast to see how the wound does. The wounds are roughly the same size as last week, with the right perhaps being a little bit smaller. He continues to build up callus on both sites. 11/18/2021: Last week, I permitted the patient to go without his total contact cast, just to see if the cast was really making any difference. Today, both wounds have deteriorated to some extent, suggesting that the cast is providing benefit, at least on the left. Both are larger and have accumulated callus, slough, and other debris. 11/26/2021: I debrided both wounds quite aggressively last week in an effort to stimulate the healing cascade. This appears to have been effective as the left sided wound is a full centimeter shorter in length. Although the right was measured slightly larger, on inspection, it looks as though an area of epithelialized tissue was included in the measurements. We have been using PolyMem Ag on the wound surfaces with a total contact cast to the left. 12/02/2021: It appears that the intake personnel are including epithelialized tissue in his wound measurements; the right wound is almost completely epithelialized; there is just a crater at the proximal midfoot with some open areas. On the left, he has built up some callus, but the overall wound surface looks good. There is some senescent skin around the wound margin. He has been in PolyMem Ag bilaterally with a total contact cast on the left. 12/09/2021: The right wound is nearly closed; there is just a small open area at the mid calcaneus. On the left, the wound is smaller with minimal callus buildup. No significant drainage. 12/16/2021: The right calcaneal wound remains minimally open at the mid calcaneus; the rest has epithelialized. On the left, the wound is also a little bit smaller. There is some senescent tissue on the periphery. He is getting  his first application of a trial skin substitute called Vendaje today. 12/23/2021: The wound on his right calcaneus is nearly closed; there is just a small area at the most distal aspect of the calcaneus that is open. On the left, the area where we applied to the skin substitute has a healthier-looking bed of granulation tissue. The wound dimensions are not significantly different on this side but the wound surface is improved. 12/30/2021: The wound on the right calcaneus has not changed significantly aside from some accumulation of callus. On the left, the open area is smaller and continues to have an improved surface. He continues to accumulate callus around the wound. He is here for his third application of Vendaje. 01/06/2022: The right calcaneal wound is down to just a couple of millimeters. He continues to accumulate periwound callus. He unfortunately got his cast wet earlier in the week and his left foot is macerated, resulting in some superficial skin loss just distal to the open wound. The open wound itself, however, is much smaller and has a healthier appearing surface. He is here for his fourth application of Vendaje. 01/13/2022: The right calcaneal wound is about the same. Unfortunately, once again, his cast got wet and his foot is again macerated. This is caused the left calcaneal wound to enlarge. He is here for his fifth application of Vendaje. 01/20/2022: The right calcaneal wound is very small. There is some periwound callus accumulation. He purchased a new cast protector last week and this has been effective in avoiding the  maceration that has been occurring on the left. The left calcaneal wound is narrower and has a healthy and viable-appearing surface. He is here for his 6 application of Vendaje. 01/27/2022: The right calcaneal wound is down to just a pinhole. There is some periwound slough and callus. On the left, the wound is narrower and shorter by about a centimeter. The surface is  robust and viable-appearing. Unfortunately, the rep for the trial skin substitute product did not provide any for [place] to use today. 02/04/2022: The right calcaneal wound remains unchanged. There is more accumulated callus. On the left, although the intake nurse measured it a little bit longer, it looks about the same to me. The surface has a layer of slough, but underneath this, there is good granulation tissue. 02/10/2022: The right calcaneus wound is nearly closed. There is still some callus that builds up around the site. The left side looks about the same in terms of dimensions, but the surface is more robust and vital-appearing. 02/16/2022: The area of the right calcaneus that was nearly closed last week has closed, but there is a small opening at the mid foot where it looks like some moisture got retained and caused some reopening. The left foot wound is narrower and shallower. Both sites have a fair amount of periwound callus and eschar. 02/24/2022: The small midfoot opening on the right calcaneus is a little bit smaller today. The left foot wound is narrower and shallower. He continues to accumulate periwound callus. No concern for infection. 03/01/2022: The patient came to clinic early because he showered and got his cast wet. Fortunately, there is no significant maceration to his foot but the callus softened and it looks like the wound on his left calcaneus may be a little bit wider. The wound on his right calcaneus is just a narrow slit. Continued accumulation of periwound callus bilaterally. 03/08/2022: The wound on his right calcaneus is very nearly closed, just a small pinpoint opening under a bit of eschar; the left wound has come in quite a bit, as well. It is narrower and shorter than at our last visit. Still with accumulated callus and eschar bilaterally. 03/17/2022: The right calcaneal wound is healed. The left wound is smaller and the surface itself is very clean, but there is some  blue-green staining on the periwound callus, concerning for Pseudomonas aeruginosa. 03/23/2022: The right calcaneal wound remains closed. The left wound continues to contract. No further blue-green staining. Small amount of callus and slough accumulation. 03/28/2022: He came in early today because he had gotten his cast wet. On inspection, the wound itself did not get wet or macerated, just a little bit of the forefoot. The wound itself is basically unchanged. 04/07/2022: The right foot wound remains closed. The left wound is the smallest that I have seen it to date. It is narrower and shorter. It still continues to accumulate slough on the surface. 04/15/2022: There is a band of epithelium now dividing the small left plantar foot wound in 2. There is still some slough on the surface. 04/21/2022: The wound continues to narrow. Just a little bit of slough on the surface. He seems to be responding well to endoform. 04/28/2022: Continued slow contraction of the wound. There is a little slough on the surface and some periwound callus. We have been using endoform and total contact cast. 05/05/2022: The wound appears to have stalled. There is slough and some periwound eschar/callus. No concern for infection, however. QUILLIAN, VARBLE ([id number]) [id number] Page  5 of 16 05/12/2022: Unfortunately, his right foot has reopened. It is located at the most posterior aspect of his surgical incision. The area was noted to have drainage coming from it when his padding was removed today. Underneath some callus and senescent skin, there is an opening. No purulent drainage or malodor. On the left foot, the wound is again unchanged. There is some light blue staining on the callus, but no malodor or purulent drainage. 10/13; right and left heel remanence of extensive plantar foot wounds. These are better than I remember by quite a big margin however he is still left with wounds on the left plantar  heel and the right plantar heel. Been using endoform bilaterally. A culture was done that showed apparently Pseudomonas but we are still waiting for the [facility] antibiotic to use gentamicin today. He is still very active by description I am not sure about the offloading of his noncasted right foot 10/20; both wounds right and left heel debrided not much change from last week. [name] Echevaria has arrived which is linezolid, gentamicin and ciprofloxacin we will use this with endoform. T contact cast on the left otal 06/02/2022: Both wounds are smaller today. There is still a fair amount of callus buildup around the right foot ulcer. The left is more superficial and nearly flush with the surrounding tissues. Also with slough and eschar buildup. 06/10/2022: The right sided wound appears to be nearly closed, if not completely so, although it is somewhat difficult to tell given the abnormal tissue and scarring in his foot. There is a fair amount of callus and crust accumulation. On the left, the wound looks about the same, again with callus and slough. He has an appointment next Thursday with Dr. Annamary Rummage in podiatry; I am hopeful that there may be some reconstructive options available for Mr. Bencosme. 06/16/2022: Both wounds have some eschar and callus accumulation. The right sided wound is extremely narrow and barely open; the left is narrower than last week. There is a little bit of slough. He has his appointment in podiatry later today. 06/23/2022: The patient met with Dr. Annamary Rummage last week and unfortunately, there are no reconstructive options that he believes would be helpful. He did order an MRI to evaluate for osteomyelitis and fortunately, none was seen. The left sided wound is a little bit shorter and narrower today. The right sided wound is about the same. There is callus and eschar accumulation bilaterally. 06/29/2022: Both feet have improved from last week. There is epithelium making a valiant  effort to creep across the surface on the left. The right side looks like it got a little dry and the deep crevasse in his midfoot has cracked. Both have eschar and there is some slough on the left. 07/07/2022: Both feet have improved. There is epithelium completely covering the calcaneus on the right with just a small opening in the crevasse in his midfoot. On the left, the open area of tissue is smaller but he continues to build up callus/eschar and slough. 07/15/2022: The opening in the midfoot on the right is about the same size, covered with eschar and a little bit of slough. The open portion of the left wound is narrower and shorter with a bit of slough buildup. He admits to being on his feet more than recommended. 12/14; as far as I can tell everything on the right foot is closed. There is some eschar I removed some of this I cannot identify any open wound here. As usual this  will be a very vulnerable area going forward. On the left this looks really quite healthy. I was pleasantly surprised to see how good this looked. Wound is certainly smaller and there appears to be healthy epithelialization. He has been using Promogran on the right and endoform on the left. He has been offloading the right foot with a heel offloading boot and he has a running shoe on the right foot 08/04/2022: The right foot remains closed. He has a thick cushioned insole in his sneaker. The left sided wound is smaller with just some slough and eschar accumulation. He is wearing the heel off loader on this foot. 08/15/2022: The right foot remains closed. The left wound has narrowed further. There is some slough and eschar accumulation. 08/25/2022: We put him in a peg assist shoe insert and as a result, he has more epithelialization of the ulcer with minimal slough and eschar accumulation. 09/01/2022: The wound is smaller by about half this week. Still with some slough on the surface. The peg assist shoe seems to be doing a  remarkable job of adequately offloading the site. 09/08/2022: There is a little bit more epithelium coming in. There is some slough and callus buildup. 09/16/2022: The wound measures about the same size, but the epithelium that has grown in looks more robust and stronger. There is some slough and callus buildup. 09/23/2022: The wound remains about the same size. The skin edges are looking rather senescent. 09/29/2022: The aggressive debridement I performed last week seems to have been effective. The wound is smaller and has significantly less slough accumulation. 10/06/2022: There has been quite a bit of epithelialization since last week. There are still some open areas with slough accumulation. There is some callus buildup around the perimeter. 10/13/2022: Continued improvement. Minimal callus accumulation. 3/14; patient presents for follow-up. He has been using endoform to the wound bed without issues. He is using a surgical shoe with peg assist for offloading. 10/27/2022: The wound dimensions are stable. There is some senescent skin accumulation around the perimeter. 11/04/2022: Last week I performed a very aggressive debridement in an effort to stimulate the healing cascade. As has been the case when I have done this before, the wound has responded well. There has been epithelialization and contraction of the wound. There are just a couple of small open areas. 11/10/2022: Unfortunately, his foot got wet secondary to sweating and there has been some breakdown of the tissue, particularly the skin just adjacent to the main ulcer. 11/18/2022: We continue to struggle with moisture-related tissue breakdown around the perimeter of his wound. This has caused the thin epithelium that had formed on the surface to disintegrate. The underlying surface of the wound has good granulation tissue. 11/25/2022: The edges of the wound are much less macerated, but the surface is actually looking a little bit dry. There is slough  accumulation. No obvious signs of infection. 12/01/2022: The wound edges are more macerated and broken down, making the wound larger. The surface has some slough on it. 12/08/2020: The wound looks better this week. There is significantly less maceration and moisture-related tissue breakdown. There is some slough on the wound surface. 12/15/2022: The wound continues to improve. There is almost no moisture-related tissue breakdown. There is epithelium beginning to fill in again from the edges. Light slough on the wound surface. 12/22/2022: More epithelium has filled him from around the edges. There is some slough on the surface. No evidence of moisture-related tissue breakdown. CAYDN, CZAPLA ([id number]) [id number] Page  6 of 16 Electronic Signature(s) Signed: 12/22/2022 12:18:51 PM By: [name] MD FACS Entered By: [name] on 12/22/2022 12:18:51 -------------------------------------------------------------------------------- Physical Exam Details Patient Name: Date of Service: [name], [name] 12/22/2022 9:45 A M Medical Record Number: [id number] Patient Account Number: [id number] Date of Birth/Sex: Treating RN: May 09, 1976 (47 y.o. M) Primary Care Provider: Dorinda Hill Other Clinician: Referring Provider: Treating Provider/Extender: Jana Hakim in Treatment: 127 Constitutional . . . . no acute distress. Respiratory Normal work of breathing on room air. Notes 12/22/2022: More epithelium has filled him from around the edges. There is some slough on the surface. No evidence of moisture-related tissue breakdown. Electronic Signature(s) Signed: 12/22/2022 12:19:35 PM By: [name] MD FACS Entered By: [name] on 12/22/2022 12:19:35 -------------------------------------------------------------------------------- Physician Orders Details Patient Name: Date of Service: [name], TO [name] 12/22/2022 9:45 A M Medical  Record Number: [id number] Patient Account Number: [id number] Date of Birth/Sex: Treating RN: 1976-03-23 (47 y.o. [name] Primary Care Provider: Dorinda Hill Other Clinician: Referring Provider: Treating Provider/Extender: Jana Hakim in Treatment: [phone] Verbal / Phone Orders: No Diagnosis Coding ICD-10 Coding Code Description [phone] Non-pressure chronic ulcer of other part of left foot with fat layer exposed Follow-up Appointments ppointment in 1 week. - Dr. Lady Gary Room 3 Return A Anesthetic Wound #2 Left Calcaneus (In clinic) Topical Lidocaine 4% applied to wound bed - In clinic Bathing/ Shower/ Hygiene May shower and wash wound with soap and water. Edema Control - Lymphedema / SCD / Other Bilateral Lower Extremities Avoid standing for long periods of time. Moisturize legs daily. - as needed Off-Loading Other: - keep pressure off of the bottom of your feet. Elevate legs throughout the day. Use the Shoe with the PegAssist off-loading insole Additional Orders / Instructions Follow Nutritious Diet - Try to get 70-100 grams of Protein a day+ Wound Treatment SHAKI, LADEWIG ([id number]) [id number] Page 7 of 16 Wound #2 - Calcaneus Wound Laterality: Left Cleanser: Normal Saline (Generic) 1 x Per Day/30 Days Discharge Instructions: Cleanse the wound with Normal Saline prior to applying a clean dressing using gauze sponges, not tissue or cotton balls. Cleanser: Wound Cleanser (Generic) 1 x Per Day/30 Days Discharge Instructions: Cleanse the wound with wound cleanser prior to applying a clean dressing using gauze sponges, not tissue or cotton balls. Peri-Wound Care: Zinc Oxide Ointment 30g tube 1 x Per Day/30 Days Discharge Instructions: Apply Zinc Oxide to periwound with each dressing change Topical: Gentamicin 1 x Per Day/30 Days Topical: compounding topical antibiotics from [facility] pharmacy 1 x Per Day/30 Days Discharge  Instructions: HOLD Prim Dressing: Maxorb Extra Ag+ Alginate Dressing, 4x4.75 (in/in) 1 x Per Day/30 Days ary Discharge Instructions: Apply to wound bed as instructed Secondary Dressing: Drawtex 4x4 in 1 x Per Day/30 Days Discharge Instructions: Apply over primary dressing as directed. Secondary Dressing: Optifoam Non-Adhesive Dressing, 4x4 in (Generic) 1 x Per Day/30 Days Discharge Instructions: Apply over primary dressing as directed. Secondary Dressing: Woven Gauze Sponge, Non-Sterile 4x4 in (Generic) 1 x Per Day/30 Days Discharge Instructions: Apply over primary dressing as directed. Secured With: 35M Medipore H Soft Cloth Surgical T ape, 4 x 10 (in/yd) (Generic) 1 x Per Day/30 Days Discharge Instructions: Secure with tape as directed. Compression Wrap: Kerlix Roll 4.5x3.1 (in/yd) 1 x Per Day/30 Days Discharge Instructions: Apply Kerlix and Coban compression as directed. Add-Ons: [employer] Offloading Boot, Size Regular 1 x Per Day/30 Days Electronic Signature(s) Signed: 12/22/2022 4:03:18 PM By: Lady Gary,  Victorino Dike MD FACS Entered By: [name] on 12/22/2022 12:21:38 -------------------------------------------------------------------------------- Problem List Details Patient Name: Date of Service: [name] TO [name] 12/22/2022 9:45 A M Medical Record Number: [id number] Patient Account Number: [id number] Date of Birth/Sex: Treating RN: May 02, 1976 (47 y.o. [name] Primary Care Provider: Dorinda Hill Other Clinician: Referring Provider: Treating Provider/Extender: Jana Hakim in Treatment: 127 Active Problems ICD-10 Encounter Code Description Active Date MDM Diagnosis [phone] Non-pressure chronic ulcer of other part of left foot with fat layer exposed 09/03/2020 No Yes Inactive Problems ICD-10 Code Description Active Date Inactive Date L97.512 Non-pressure chronic ulcer of other part of right foot with fat layer exposed 09/03/2020  09/03/2020 L89.893 Pressure ulcer of other site, stage 3 07/15/2020 07/15/2020 M62.81 Muscle weakness (generalized) 07/15/2020 07/15/2020 [name] ([id number]) [id number] Page 8 of 16 I10 Essential (primary) hypertension 07/15/2020 07/15/2020 M86.171 Other acute osteomyelitis, right ankle and foot 09/03/2020 09/03/2020 Resolved Problems Electronic Signature(s) Signed: 12/22/2022 12:17:38 PM By: [name] MD FACS Entered By: [name] on 12/22/2022 12:17:38 -------------------------------------------------------------------------------- Progress Note Details Patient Name: Date of Service: [name], [name] 12/22/2022 9:45 A M Medical Record Number: [id number] Patient Account Number: [id number] Date of Birth/Sex: Treating RN: 06/03/1976 (47 y.o. M) Primary Care Provider: Dorinda Hill Other Clinician: Referring Provider: Treating Provider/Extender: Jana Hakim in Treatment: 127 Subjective Chief Complaint Information obtained from Patient Bilateral Plantar Foot Ulcers History of Present Illness (HPI) Wounds are12/03/2020 upon evaluation today patient presents for initial inspection here in our clinic concerning issues he has been having with the bottoms of his feet bilaterally. He states these actually occurred as wounds when he was hospitalized for 5 months secondary to Covid. He was apparently with tilting bed where he was in an upright position quite frequently and apparently this occurred in some way shape or form during that time. Fortunately there is no sign of active infection at this time. No fevers, chills, nausea, vomiting, or diarrhea. With that being said he still has substantial wounds on the plantar aspects of his feet Theragen require quite a bit of work to get these to heal. He has been using Santyl currently though that is been problematic both in receiving the medication as well as actually paid for it as it is  become quite expensive. Prior to the experience with Covid the patient really did not have any major medical problems other than hypertension he does have some mild generalized weakness following the Covid experience. 07/22/2020 on evaluation today patient appears to be doing okay in regard to his foot ulcers I feel like the wound beds are showing signs of better improvement that I do believe the Iodoflex is helping in this regard. With that being said he does have a lot of drainage currently and this is somewhat blue/green in nature which is consistent with Pseudomonas. I do think a culture today would be appropriate for [place] to evaluate and see if that is indeed the case I would likely start him on antibiotic orally as well he is not allergic to Cipro knows of no issues he has had in the past 12/21; patient was admitted to the clinic earlier this month with bilateral presumed pressure ulcers on the bottom of his feet apparently related to excessive pressure from a tilt table arrangement in the intensive care unit. Patient relates this to being on ECMO but I am not really sure that is exactly related to that. I must say I have never seen  anything like this. He has fairly extensive full-thickness wounds extending from his heel towards his midfoot mostly centered laterally. There is already been some healing distally. He does not appear to have an arterial issue. He has been using gentamicin to the wound surfaces with Iodoflex to help with ongoing debridement 1/6; this is a patient with pressure ulcers on the bottom of his feet related to excessive pressure from a standing position in the intensive care unit. He is complaining of a lot of pain in the right heel. He is not a diabetic. 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 We have been using Iodoflex to help prepare the surfaces of both wounds for an advanced treatment product. He is nonambulatory spending most of his time in a  wheelchair I have asked him not to propel the wheelchair with his heels 1/13; in general his wounds look better not much surface area change we have been using Iodoflex as of last week. I did an x-ray of the right heel as the patient was complaining of pain. I had some thoughts about a stress fracture perhaps Achilles tendon problems however what it showed was erosive changes along the inferior aspect of the calcaneus he now has a MRI booked for 1/20. 1/20; in general his wounds continue to be better. Some improvement in the large narrow areas proximally in his foot. He is still complaining of pain in the right heel and tenderness in certain areas of this wound. His MRI is tonight. I am not just looking for osteomyelitis that was brought up on the x-ray I am wondering about stress fractures, tendon ruptures etc. He has no such findings on the left. Also noteworthy is that the patient had critical illness neuropathy and some of the discomfort may be actual improvement in nerve function I am just not sure. These wounds were initially in the setting of severe critical illness related to COVID-19. He was put in a standing position. He may have also been on pressors at the point contributing to tissue ischemia. By his description at some point these wounds were grossly necrotic extending proximally up into the Achilles part of his heel. I do not know that I have ever really seen pictures of them like this although they may exist in epic We have ordered Tri layer Oasis. I am trying to stimulate some granulation in these areas. This is of course assuming the MRI is negative for infection 1/27; since the patient was last here he saw Dr. Earlene Plater of infectious disease. He is planned for vancomycin and ceftriaxone. Prior operative culture grew MSSA. Also ordered baseline lab work. He also ordered arterial studies although the ABIs in our clinic were normal as well as his clinical exam these were normal I do not  think he needs to see vascular surgery. His ABIs at the [facility] were 1.22 in the right triphasic waveforms with a normal TBI of 1.15 on the left ABI of 1.22 with triphasic waveforms and a normal TBI of 1.08. Finally he saw Dr. Logan Bores who will follow him in for 2 months. At this point I do not think he felt that he needed a procedure on the right calcaneal bone. Dr. Earlene Plater is elected for broad-spectrum antibiotic The patient is still having pain in the right heel. He walks with a walker 2/3; wounds are generally smaller. He is tolerating his IV antibiotics. I believe this is vancomycin and ceftriaxone. We are still waiting for Oasis burn in terms Rekowski,  TYQUAVI[employer] P[employer]UE ([id number]) [id number] Page 9 of 16 of his out-of-pocket max which he should be meeting soon given the IV antibiotics, MRIs etc. I have asked him to check in on this. We are using silver collagen in the meantime the wounds look better 2/10; tolerating IV vancomycin and Rocephin. We are waiting to apply for Oasis. Although I am not really sure where he is in his out-of-pocket max. 2/17 started the first application of Oasis trilayer. Still on antibiotics. The wounds have generally look better. The area on the left has a little more surface slough requiring debridement 2/24; second application of Oasis trilayer. The wound surface granulation is generally look better. The area on the left with undermining laterally I think is come in a bit. 10/08/2020 upon evaluation today patient is here today for [employer] application #3. Fortunately he seems to be doing extremely well with regard to this and we are seeing a lot of new epithelial growth which is great news. Fortunately there is no signs of active infection at this time. 10/16/2020 upon evaluation today patient appears to be doing well with regard to his foot ulcers. Do believe the Oasis has been of benefit for him. I do not see any signs of infection right now which  is great news and I think that he has a lot of new epithelial growth which is great to see as well. The patient is very pleased to hear all of this. I do think we can proceed with the Oasis trilayer #4 today. 3/18; not as much improvement in these areas on his heels that I was hoping. I did reapply trilateral Oasis today the tissue looks healthier but not as much fill in as I was hoping. 3/25; better looking today I think this is come in a bit the tissue looks healthier. Triple layer Oasis reapplied #6 4/1; somewhat better looking definitely better looking surface not as much change in surface area as I was hoping. He may be spending more time [place] on days then he needs to although he does have heel offloading boots. Triple layer Oasis reapplied #7 4/7; unfortunately apparently [facility] will not approve any further Oasis which is unfortunate since the patient did respond nicely both in terms of the condition of the wound bed as well as surface area. There is still some drainage coming from the wound but not a lot there does not appear to be any infection 4/15; we have been using Hydrofera Blue. He continues to have nice rims of epithelialization on the right greater than the left. The left the epithelialization is coming from the tip of his heel. There is moderate drainage. In this that concerns me about a total contact cast. There is no evidence of infection 4/29; patient has been using Hydrofera Blue with dressing changes. He has no complaints or issues today. 5/5; using Hydrofera Blue. I actually think that he looks marginally better than the last time I saw this 3 weeks ago. There are rims of epithelialization on the left thumb coming from the medial side on the right. Using Hydrofera Blue 5/12; using Hydrofera Blue. These continue to make improvements in surface area. His drainage was not listed as severe I therefore went ahead and put a cast on the left foot. Right foot we will  continue to dress his previous 5/16; back for first total contact cast change. He did not tolerate this particularly well cast injury on the anterior tibia among other issues. Difficulty sleeping.  I talked him about this in some detail and afterwards is elected to continue. I told him I would like to have a cast on for 3 weeks to see if this is going to help at all. I think he agreed 5/19; I think the wound is better. There is no tunneling towards his midfoot. The undermining medially also looks better. He has a rim of new skin distally. I think we are making progress here. The area on the left also continues to look somewhat better to me using Hydrofera Blue. He has a list of complaints about the cast but none of them seem serious 5/26; patient presents for 1 week follow-up. He has been using a total contact cast and tolerating this well. Hydrofera Blue is the main dressing used. He denies signs of infection. 6/2 Hydrofera Blue total contact cast on the left. These were large ulcers that formed in intensive care unit where the patient was recovering from COVID. May have had something to do with being ventilated in an upright positiono Pressors etc. We have been able to get the areas down considerably and a viable surface. There is some epithelialization in both sides. Note made of drainage 6/9; changed to [facility] last time because of drainage. He arrives with better looking surfaces and dimensions on the left than the right. Paradoxically the right actually probes more towards his midfoot the left is largely close down but both of these look improved. Using a total contact cast on the left 6/16; complex wounds on his bilateral plantar heels which were initially pressure injury from a stay in the ICU with COVID. We have been using silver alginate most recently. His dimensions of come in quite dramatically however not recently. We have been putting the left foot in a total contact cast 6/23;  complex wounds on the bilateral plantar heels. I been putting the left in the cast paradoxically the area on the right is the one that is going towards closure at a faster rate. Quite a bit of drainage on the left. The patient went to see Dr. Logan Bores who said he was going to standby for skin grafts. I had actually considered sending him for skin grafts however he would be mandatorily off his feet for a period of weeks to months. I am thinking that the area on the right is going to close on its own the area on the left has been more stubborn even though we have him in a total contact cast 6/30; took him out of a total contact cast last week is the right heel seem to be making better progress than the left where I was placing the cast. We are using silver alginate. Both wounds are smaller right greater than left 7/12; both wounds look as though they are making some progress. We are using silver alginate. Heel offloading boots 7/26; very gradual progress especially on the right. Using silver alginate. He is wearing heel offloading boots 8/18; he continues to close these wounds down very gradually. Using silver alginate. The problem polymen being definitive about this is areas of what appears to be callus around the margins. This is not a 100% of the area but certainly sizable especially on the right 9/1; bilateral plantar feet wounds secondary to prolonged pressure while being ventilated for COVID-19 in an upright position. Essentially pressure ulcers on the bottom of his feet. He is made substantial progress using silver alginate. 9/14; bilateral plantar feet wounds secondary to prolonged pressure. Making progress using  silver alginate. 9/29 bilateral plantar feet wounds secondary to prolonged pressure. I changed him to Iodoflex last week. MolecuLight showing reddened blush fluorescence 10/11; patient presents for follow-up. He has no issues or complaints today. He denies signs of infection. He continues  to use Iodoflex and antibiotic ointment to the wound beds. 10/27; 2-week follow-up. No evidence of infection. He has callus and thick dry skin around the wound margins we have been using Iodoflex and Bactroban which was in response to a moderate left MolecuLight reddish blush fluorescence. 11/10; 2-week follow-up. Wound margins again have thick callus however the measurements of the actual wound sites are a lot smaller. Everything looks reasonably healthy here. We have been using Iodoflex He was approved for prime matrix but I have elected to delay this given the improvement in the surface area. Hopefully I will not regret that decision as were getting close to the end of the year in terms of insurance payment 12/8; 2-week follow-up. Wounds are generally smaller in size. These were initially substantial wounds extending into the forefoot all the way into the heel on the bilateral plantar feet. They are now both located on the plantar heel distal aspect both of these have a lot of callus around the wounds I used a #5 curette to remove this on the right and the left also some subcutaneous debris to try and get the wound edges were using Iodoflex. He has heel offloading shoe 12/22; 2-week follow-up. Not really much improvement. He has thick callus around the outer edges of both wounds. I remove this there is some nonviable subcutaneous tissue as well. We have been using Iodoflex. Her intake nurse and myself spontaneously thought of a total contact cast I went back in May. At that time we really were not seeing much of an improvement with a cast although the wound was in a much different situation I would like to retry this in 2 weeks and I discussed this with the patient 08/12/2021; the patient has had some improvement with the Iodoflex. The the area on the left heel plantar more improved than the right. I had to put him in a total contact cast on the left although I decided to put that off for 2 weeks. I  am going to change his primary dressing to silver collagen. I think in both areas he has had some improvement most of the healing seems to be more proximal in the heel. The wounds are in the mid aspect. A lot of thick callus on the right heel however. 1/19; we are using silver collagen on both plantar heel areas. He has had some improvement today. The left did not require any debridement. He still had some eschar on the right that was debrided but both seem to have contracted. I did not put it total contact cast on him today 2/2 we have been using silver collagen. The area on the right plantar heel has areas that appear to be epithelialized interspersed with dry flaking callus and dry skin. I removed this. This really looks better than on the other side. On the left still a large area with raised edges and debris on the surface. 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 He has no issues or complaints today. RACE, HIDER ([id number]) [id number] Page 10 of 16 2/9; not much change in the left foot wound with  1 week of a cast we are using silver collagen. Silver collagen on the right side. The right side has been the better wound surface. We will reapply the total contact cast on the left 2/16; not much improvement on either side I been using silver collagen with a total contact cast on the left. I'm changing the Hydrofera Blue still with a total contact cast on the left 2/23; some improvement on both sides. Disappointing that he has thick callus around the area that we are putting in a total contact cast on the left. We've been using Hydrofera Blue on both wound areas. This is a man who at essentially pressure ulcers in addition to ischemia caused by medications to support his blood pressure (pressors) in the ICU. He was being ventilated in  the standing position for severe Covid. A Shiley the wounds extended across his entire foot but are now localized to his plantar heels bilaterally. We have made progress however neither areas healed. I continue to think the total contact cast is helped albeit painstakingly slowly. He has never wanted a plastic surgery consult although I don't know that they would be interested in grafting in area in this location. 10/07/2021: Continued improvement bilaterally. There is still some callus around the left wound, despite the total contact cast. He has some increased pain in his right midfoot around 1 particular area. This has been painful in the past but seems to be a little bit worse. When his cast was removed today, there was an area on the heel of the left foot that looks a bit macerated. He is also complaining of pain in his left thigh and hip which he thinks is secondary to the limb length discrepancy caused by the cast. 10/14/2021: He continues to improve. A little bit less callus around the left wound. He continues to endorse pain in his right midfoot, but this is not as significant as it was last week. The maceration on his left heel is improved. 10/21/2021: Continued improvement to both wounds. The maceration on his left heel is no longer evident. Less callus bilaterally. Epithelialization progressing. 10/28/2021: Significant improvement this week. The right sided wound is nearly closed with just a small open area at the middle. No maceration seen on the left heel. Continued epithelialization on both sides. No concern for infection. 11/04/2021: T oday, the wounds were measured a little bit differently and come out as larger, but I actually think they are about the same to potentially even smaller, particularly on the left. He continues to accumulate some callus on the right. 11/11/2021: T oday, the patient is expressing some concern that the left wound, despite being in the total contact cast, is not  progressing at the same rate as the 1 on the right. He is interested in trying a week without the cast to see how the wound does. The wounds are roughly the same size as last week, with the right perhaps being a little bit smaller. He continues to build up callus on both sites. 11/18/2021: Last week, I permitted the patient to go without his total contact cast, just to see if the cast was really making any difference. Today, both wounds have deteriorated to some extent, suggesting that the cast is providing benefit, at least on the left. Both are larger and have accumulated callus, slough, and other debris. 11/26/2021: I debrided both wounds quite aggressively last week in an effort to stimulate the healing cascade. This appears to have been effective as the  left sided wound is a full centimeter shorter in length. Although the right was measured slightly larger, on inspection, it looks as though an area of epithelialized tissue was included in the measurements. We have been using PolyMem Ag on the wound surfaces with a total contact cast to the left. 12/02/2021: It appears that the intake personnel are including epithelialized tissue in his wound measurements; the right wound is almost completely epithelialized; there is just a crater at the proximal midfoot with some open areas. On the left, he has built up some callus, but the overall wound surface looks good. There is some senescent skin around the wound margin. He has been in PolyMem Ag bilaterally with a total contact cast on the left. 12/09/2021: The right wound is nearly closed; there is just a small open area at the mid calcaneus. On the left, the wound is smaller with minimal callus buildup. No significant drainage. 12/16/2021: The right calcaneal wound remains minimally open at the mid calcaneus; the rest has epithelialized. On the left, the wound is also a little bit smaller. There is some senescent tissue on the periphery. He is getting his  first application of a trial skin substitute called Vendaje today. 12/23/2021: The wound on his right calcaneus is nearly closed; there is just a small area at the most distal aspect of the calcaneus that is open. On the left, the area where we applied to the skin substitute has a healthier-looking bed of granulation tissue. The wound dimensions are not significantly different on this side but the wound surface is improved. 12/30/2021: The wound on the right calcaneus has not changed significantly aside from some accumulation of callus. On the left, the open area is smaller and continues to have an improved surface. He continues to accumulate callus around the wound. He is here for his third application of Vendaje. 01/06/2022: The right calcaneal wound is down to just a couple of millimeters. He continues to accumulate periwound callus. He unfortunately got his cast wet earlier in the week and his left foot is macerated, resulting in some superficial skin loss just distal to the open wound. The open wound itself, however, is much smaller and has a healthier appearing surface. He is here for his fourth application of Vendaje. 01/13/2022: The right calcaneal wound is about the same. Unfortunately, once again, his cast got wet and his foot is again macerated. This is caused the left calcaneal wound to enlarge. He is here for his fifth application of Vendaje. 01/20/2022: The right calcaneal wound is very small. There is some periwound callus accumulation. He purchased a new cast protector last week and this has been effective in avoiding the maceration that has been occurring on the left. The left calcaneal wound is narrower and has a healthy and viable-appearing surface. He is here for his 6 application of Vendaje. 01/27/2022: The right calcaneal wound is down to just a pinhole. There is some periwound slough and callus. On the left, the wound is narrower and shorter by about a centimeter. The surface is robust  and viable-appearing. Unfortunately, the rep for the trial skin substitute product did not provide any for [place] to use today. 02/04/2022: The right calcaneal wound remains unchanged. There is more accumulated callus. On the left, although the intake nurse measured it a little bit longer, it looks about the same to me. The surface has a layer of slough, but underneath this, there is good granulation tissue. 02/10/2022: The right calcaneus wound is nearly closed.  There is still some callus that builds up around the site. The left side looks about the same in terms of dimensions, but the surface is more robust and vital-appearing. 02/16/2022: The area of the right calcaneus that was nearly closed last week has closed, but there is a small opening at the mid foot where it looks like some moisture got retained and caused some reopening. The left foot wound is narrower and shallower. Both sites have a fair amount of periwound callus and eschar. 02/24/2022: The small midfoot opening on the right calcaneus is a little bit smaller today. The left foot wound is narrower and shallower. He continues to accumulate periwound callus. No concern for infection. 03/01/2022: The patient came to clinic early because he showered and got his cast wet. Fortunately, there is no significant maceration to his foot but the callus softened and it looks like the wound on his left calcaneus may be a little bit wider. The wound on his right calcaneus is just a narrow slit. Continued accumulation of periwound callus bilaterally. 03/08/2022: The wound on his right calcaneus is very nearly closed, just a small pinpoint opening under a bit of eschar; the left wound has come in quite a bit, as well. It is narrower and shorter than at our last visit. Still with accumulated callus and eschar bilaterally. 03/17/2022: The right calcaneal wound is healed. The left wound is smaller and the surface itself is very clean, but there is some blue-green  staining on the NOTNAMED, SCHOLZ ([id number]) [phone].pdf Page 11 of 16 periwound callus, concerning for Pseudomonas aeruginosa. 03/23/2022: The right calcaneal wound remains closed. The left wound continues to contract. No further blue-green staining. Small amount of callus and slough accumulation. 03/28/2022: He came in early today because he had gotten his cast wet. On inspection, the wound itself did not get wet or macerated, just a little bit of the forefoot. The wound itself is basically unchanged. 04/07/2022: The right foot wound remains closed. The left wound is the smallest that I have seen it to date. It is narrower and shorter. It still continues to accumulate slough on the surface. 04/15/2022: There is a band of epithelium now dividing the small left plantar foot wound in 2. There is still some slough on the surface. 04/21/2022: The wound continues to narrow. Just a little bit of slough on the surface. He seems to be responding well to endoform. 04/28/2022: Continued slow contraction of the wound. There is a little slough on the surface and some periwound callus. We have been using endoform and total contact cast. 05/05/2022: The wound appears to have stalled. There is slough and some periwound eschar/callus. No concern for infection, however. 05/12/2022: Unfortunately, his right foot has reopened. It is located at the most posterior aspect of his surgical incision. The area was noted to have drainage coming from it when his padding was removed today. Underneath some callus and senescent skin, there is an opening. No purulent drainage or malodor. On the left foot, the wound is again unchanged. There is some light blue staining on the callus, but no malodor or purulent drainage. 10/13; right and left heel remanence of extensive plantar foot wounds. These are better than I remember by quite a big margin however he is still left with wounds on the left plantar heel and the  right plantar heel. Been using endoform bilaterally. A culture was done that showed apparently Pseudomonas but we are still waiting for the [facility] antibiotic to use gentamicin  today. He is still very active by description I am not sure about the offloading of his noncasted right foot 10/20; both wounds right and left heel debrided not much change from last week. [name] Echevaria has arrived which is linezolid, gentamicin and ciprofloxacin we will use this with endoform. T contact cast on the left otal 06/02/2022: Both wounds are smaller today. There is still a fair amount of callus buildup around the right foot ulcer. The left is more superficial and nearly flush with the surrounding tissues. Also with slough and eschar buildup. 06/10/2022: The right sided wound appears to be nearly closed, if not completely so, although it is somewhat difficult to tell given the abnormal tissue and scarring in his foot. There is a fair amount of callus and crust accumulation. On the left, the wound looks about the same, again with callus and slough. He has an appointment next Thursday with Dr. Annamary Rummage in podiatry; I am hopeful that there may be some reconstructive options available for Mr. Zdanowicz. 06/16/2022: Both wounds have some eschar and callus accumulation. The right sided wound is extremely narrow and barely open; the left is narrower than last week. There is a little bit of slough. He has his appointment in podiatry later today. 06/23/2022: The patient met with Dr. Annamary Rummage last week and unfortunately, there are no reconstructive options that he believes would be helpful. He did order an MRI to evaluate for osteomyelitis and fortunately, none was seen. The left sided wound is a little bit shorter and narrower today. The right sided wound is about the same. There is callus and eschar accumulation bilaterally. 06/29/2022: Both feet have improved from last week. There is epithelium making a valiant effort to creep  across the surface on the left. The right side looks like it got a little dry and the deep crevasse in his midfoot has cracked. Both have eschar and there is some slough on the left. 07/07/2022: Both feet have improved. There is epithelium completely covering the calcaneus on the right with just a small opening in the crevasse in his midfoot. On the left, the open area of tissue is smaller but he continues to build up callus/eschar and slough. 07/15/2022: The opening in the midfoot on the right is about the same size, covered with eschar and a little bit of slough. The open portion of the left wound is narrower and shorter with a bit of slough buildup. He admits to being on his feet more than recommended. 12/14; as far as I can tell everything on the right foot is closed. There is some eschar I removed some of this I cannot identify any open wound here. As usual this will be a very vulnerable area going forward. On the left this looks really quite healthy. I was pleasantly surprised to see how good this looked. Wound is certainly smaller and there appears to be healthy epithelialization. He has been using Promogran on the right and endoform on the left. He has been offloading the right foot with a heel offloading boot and he has a running shoe on the right foot 08/04/2022: The right foot remains closed. He has a thick cushioned insole in his sneaker. The left sided wound is smaller with just some slough and eschar accumulation. He is wearing the heel off loader on this foot. 08/15/2022: The right foot remains closed. The left wound has narrowed further. There is some slough and eschar accumulation. 08/25/2022: We put him in a peg assist shoe insert and as  a result, he has more epithelialization of the ulcer with minimal slough and eschar accumulation. 09/01/2022: The wound is smaller by about half this week. Still with some slough on the surface. The peg assist shoe seems to be doing a remarkable job  of adequately offloading the site. 09/08/2022: There is a little bit more epithelium coming in. There is some slough and callus buildup. 09/16/2022: The wound measures about the same size, but the epithelium that has grown in looks more robust and stronger. There is some slough and callus buildup. 09/23/2022: The wound remains about the same size. The skin edges are looking rather senescent. 09/29/2022: The aggressive debridement I performed last week seems to have been effective. The wound is smaller and has significantly less slough accumulation. 10/06/2022: There has been quite a bit of epithelialization since last week. There are still some open areas with slough accumulation. There is some callus buildup around the perimeter. 10/13/2022: Continued improvement. Minimal callus accumulation. 3/14; patient presents for follow-up. He has been using endoform to the wound bed without issues. He is using a surgical shoe with peg assist for offloading. 10/27/2022: The wound dimensions are stable. There is some senescent skin accumulation around the perimeter. 11/04/2022: Last week I performed a very aggressive debridement in an effort to stimulate the healing cascade. As has been the case when I have done this before, the wound has responded well. There has been epithelialization and contraction of the wound. There are just a couple of small open areas. LEVENT, OLVER ([id number]) [id number] Page 12 of 16 11/10/2022: Unfortunately, his foot got wet secondary to sweating and there has been some breakdown of the tissue, particularly the skin just adjacent to the main ulcer. 11/18/2022: We continue to struggle with moisture-related tissue breakdown around the perimeter of his wound. This has caused the thin epithelium that had formed on the surface to disintegrate. The underlying surface of the wound has good granulation tissue. 11/25/2022: The edges of the wound are much less macerated,  but the surface is actually looking a little bit dry. There is slough accumulation. No obvious signs of infection. 12/01/2022: The wound edges are more macerated and broken down, making the wound larger. The surface has some slough on it. 12/08/2020: The wound looks better this week. There is significantly less maceration and moisture-related tissue breakdown. There is some slough on the wound surface. 12/15/2022: The wound continues to improve. There is almost no moisture-related tissue breakdown. There is epithelium beginning to fill in again from the edges. Light slough on the wound surface. 12/22/2022: More epithelium has filled him from around the edges. There is some slough on the surface. No evidence of moisture-related tissue breakdown. Patient History Information obtained from Patient. Family History Cancer - Maternal Grandparents, Diabetes - Father,Paternal Grandparents, Heart Disease - Maternal Grandparents, Hypertension - Father,Paternal Grandparents, Lung Disease - Siblings, No family history of Hereditary Spherocytosis, Kidney Disease, Seizures, Stroke, Thyroid Problems, Tuberculosis. Social History Never smoker, Marital Status - Married, Alcohol Use - Never, Drug Use - No History, Caffeine Use - Daily - tea, soda. Medical History Eyes Denies history of Cataracts, Glaucoma, Optic Neuritis Ear/Nose/Mouth/Throat Denies history of Chronic sinus problems/congestion, Middle ear problems Hematologic/Lymphatic Denies history of Anemia, Hemophilia, Human Immunodeficiency Virus, Lymphedema, Sickle Cell Disease Respiratory Patient has history of Asthma Denies history of Aspiration, Chronic Obstructive Pulmonary Disease (COPD), Pneumothorax, Sleep Apnea, Tuberculosis Cardiovascular Patient has history of Angina - with COVID, Hypertension Denies history of Arrhythmia, Congestive Heart Failure, Coronary Artery Disease,  Deep Vein Thrombosis, Hypotension, Myocardial Infarction,  Peripheral Arterial Disease, Peripheral Venous Disease, Phlebitis, Vasculitis Gastrointestinal Denies history of Cirrhosis , Colitis, Crohns, Hepatitis A, Hepatitis B, Hepatitis C Endocrine Denies history of Type I Diabetes, Type II Diabetes Genitourinary Denies history of End Stage Renal Disease Immunological Denies history of Lupus Erythematosus, Raynauds, Scleroderma Integumentary (Skin) Denies history of History of Burn Musculoskeletal Denies history of Gout, Rheumatoid Arthritis, Osteoarthritis, Osteomyelitis Neurologic Denies history of Dementia, Neuropathy, Quadriplegia, Paraplegia, Seizure Disorder Oncologic Denies history of Received Chemotherapy, Received Radiation Psychiatric Denies history of Anorexia/bulimia, Confinement Anxiety Hospitalization/Surgery History - COVID PNA 07/22/2019- 11/14/2019. - 03/27/2020 wound debridement/ skin graft. Medical A Surgical History Notes nd Constitutional Symptoms (General Health) COVID PNA 07/22/2019-11/14/2019 VENT ECMO, foot drop left foot , Genitourinary kidney stone Psychiatric anxiety Objective Constitutional no acute distress. Vitals Time Taken: 9:42 AM, Height: 69 in, Weight: 280 lbs, BMI: 41.3, Temperature: 98.0 F, Pulse: 80 bpm, Respiratory Rate: 20 breaths/min, Blood Pressure: 123/80 mmHg. CHEENOU, DOWIS ([id number]) [id number] Page 13 of 16 Respiratory Normal work of breathing on room air. General Notes: 12/22/2022: More epithelium has filled him from around the edges. There is some slough on the surface. No evidence of moisture-related tissue breakdown. Integumentary (Hair, Skin) Wound #2 status is Open. Original cause of wound was Pressure Injury. The date acquired was: 10/07/2019. The wound has been in treatment 127 weeks. The wound is located on the Left Calcaneus. The wound measures 5cm length x 1.5cm width x 0.3cm depth; 5.89cm^2 area and 1.767cm^3 volume. There is Fat Layer  (Subcutaneous Tissue) exposed. There is no tunneling or undermining noted. There is a medium amount of serosanguineous drainage noted. The wound margin is distinct with the outline attached to the wound base. There is medium (34-66%) red, pink granulation within the wound bed. There is a medium (34-66%) amount of necrotic tissue within the wound bed including Adherent Slough. The periwound skin appearance exhibited: Dry/Scaly, Hemosiderin Staining. The periwound skin appearance did not exhibit: Callus, Crepitus, Excoriation, Induration, Rash, Scarring, Maceration, Atrophie Blanche, Cyanosis, Ecchymosis, Mottled, Pallor, Rubor, Erythema. Periwound temperature was noted as No Abnormality. Assessment Active Problems ICD-10 Non-pressure chronic ulcer of other part of left foot with fat layer exposed Procedures Wound #2 Pre-procedure diagnosis of Wound #2 is a Pressure Ulcer located on the Left Calcaneus . There was a Selective/Open Wound Skin/Epidermis Debridement with a total area of 5.89 sq cm performed by [name], MD. With the following instrument(s): Curette to remove Viable and Non-Viable tissue/material. Material removed includes Slough, Skin: Dermis, and Skin: Epidermis after achieving pain control using Lidocaine 4% T opical Solution. No specimens were taken. A time out was conducted at 10:10, prior to the start of the procedure. A Minimum amount of bleeding was controlled with Pressure. The procedure was tolerated well with a pain level of 0 throughout and a pain level of 0 following the procedure. Post Debridement Measurements: 5cm length x 1.5cm width x 0.3cm depth; 1.767cm^3 volume. Post debridement Stage noted as Category/Stage III. Character of Wound/Ulcer Post Debridement is improved. Post procedure Diagnosis Wound #2: Same as Pre-Procedure General Notes: Scribed for Dr Lady Gary by Brenton Grills RN.Marland Kitchen Plan Follow-up Appointments: Return Appointment in 1 week. - Dr. Lady Gary Room  3 Anesthetic: Wound #2 Left Calcaneus: (In clinic) Topical Lidocaine 4% applied to wound bed - In clinic Bathing/ Shower/ Hygiene: May shower and wash wound with soap and water. Edema Control - Lymphedema / SCD / Other: Avoid standing for long periods of time. Moisturize legs  daily. - as needed Off-Loading: Other: - keep pressure off of the bottom of your feet. Elevate legs throughout the day. Use the Shoe with the PegAssist off-loading insole Additional Orders / Instructions: Follow Nutritious Diet - Try to get 70-100 grams of Protein a day+ WOUND #2: - Calcaneus Wound Laterality: Left Cleanser: Normal Saline (Generic) 1 x Per Day/30 Days Discharge Instructions: Cleanse the wound with Normal Saline prior to applying a clean dressing using gauze sponges, not tissue or cotton balls. Cleanser: Wound Cleanser (Generic) 1 x Per Day/30 Days Discharge Instructions: Cleanse the wound with wound cleanser prior to applying a clean dressing using gauze sponges, not tissue or cotton balls. Peri-Wound Care: Zinc Oxide Ointment 30g tube 1 x Per Day/30 Days Discharge Instructions: Apply Zinc Oxide to periwound with each dressing change Topical: Gentamicin 1 x Per Day/30 Days Topical: compounding topical antibiotics from [facility] pharmacy 1 x Per Day/30 Days Discharge Instructions: HOLD Prim Dressing: Maxorb Extra Ag+ Alginate Dressing, 4x4.75 (in/in) 1 x Per Day/30 Days ary Discharge Instructions: Apply to wound bed as instructed Secondary Dressing: Drawtex 4x4 in 1 x Per Day/30 Days Discharge Instructions: Apply over primary dressing as directed. Secondary Dressing: Optifoam Non-Adhesive Dressing, 4x4 in (Generic) 1 x Per Day/30 Days Discharge Instructions: Apply over primary dressing as directed. Secondary Dressing: Woven Gauze Sponge, Non-Sterile 4x4 in (Generic) 1 x Per Day/30 Days Discharge Instructions: Apply over primary dressing as directed. Secured With: 27M Medipore H Soft Cloth Surgical T  ape, 4 x 10 (in/yd) (Generic) 1 x Per Day/30 Days Discharge Instructions: Secure with tape as directed. Com pression Wrap: Kerlix Roll 4.5x3.1 (in/yd) 1 x Per Day/30 Days Discharge Instructions: Apply Kerlix and Coban compression as directed. EDDIEL, THAN ([id number]) [id number] Page 14 of 16 Add-Ons: [employer] Offloading Boot, Size Regular 1 x Per Day/30 Days 12/22/2022: More epithelium has filled him from around the edges. There is some slough on the surface. No evidence of moisture-related tissue breakdown. I used a curette to debride slough and a little bit of skin from the wound edges. We will continue topical gentamicin with silver alginate and drawtex. He will continue to wear offloading footwear. Follow-up in 1 week. Electronic Signature(s) Signed: 12/22/2022 12:23:01 PM By: [name] MD FACS Previous Signature: 12/22/2022 12:22:18 PM Version By: [name] MD FACS Previous Signature: 12/22/2022 12:20:39 PM Version By: [name] MD FACS Entered By: [name] on 12/22/2022 12:23:00 -------------------------------------------------------------------------------- HxROS Details Patient Name: Date of Service: [name], [name] 12/22/2022 9:45 A M Medical Record Number: [id number] Patient Account Number: [id number] Date of Birth/Sex: Treating RN: 1976/01/21 (47 y.o. M) Primary Care Provider: Dorinda Hill Other Clinician: Referring Provider: Treating Provider/Extender: Jana Hakim in Treatment: 127 Information Obtained From Patient Constitutional Symptoms (General Health) Medical History: Past Medical History Notes: COVID PNA 07/22/2019-11/14/2019 VENT ECMO, foot drop left foot , Eyes Medical History: Negative for: Cataracts; Glaucoma; Optic Neuritis Ear/Nose/Mouth/Throat Medical History: Negative for: Chronic sinus problems/congestion; Middle ear problems Hematologic/Lymphatic Medical  History: Negative for: Anemia; Hemophilia; Human Immunodeficiency Virus; Lymphedema; Sickle Cell Disease Respiratory Medical History: Positive for: Asthma Negative for: Aspiration; Chronic Obstructive Pulmonary Disease (COPD); Pneumothorax; Sleep Apnea; Tuberculosis Cardiovascular Medical History: Positive for: Angina - with COVID; Hypertension Negative for: Arrhythmia; Congestive Heart Failure; Coronary Artery Disease; Deep Vein Thrombosis; Hypotension; Myocardial Infarction; Peripheral Arterial Disease; Peripheral Venous Disease; Phlebitis; Vasculitis Gastrointestinal Medical History: Negative for: Cirrhosis ; Colitis; Crohns; Hepatitis A; Hepatitis B; Hepatitis C Endocrine Medical History: Negative for: Type I  Diabetes; Type II Diabetes HIKEEM, STERMAN ([id number]) [id number] Page 15 of 16 Genitourinary Medical History: Negative for: End Stage Renal Disease Past Medical History Notes: kidney stone Immunological Medical History: Negative for: Lupus Erythematosus; Raynauds; Scleroderma Integumentary (Skin) Medical History: Negative for: History of Burn Musculoskeletal Medical History: Negative for: Gout; Rheumatoid Arthritis; Osteoarthritis; Osteomyelitis Neurologic Medical History: Negative for: Dementia; Neuropathy; Quadriplegia; Paraplegia; Seizure Disorder Oncologic Medical History: Negative for: Received Chemotherapy; Received Radiation Psychiatric Medical History: Negative for: Anorexia/bulimia; Confinement Anxiety Past Medical History Notes: anxiety Immunizations Pneumococcal Vaccine: Received Pneumococcal Vaccination: No Implantable Devices None Hospitalization / Surgery History Type of Hospitalization/Surgery COVID PNA 07/22/2019- 11/14/2019 03/27/2020 wound debridement/ skin graft Family and Social History Cancer: Yes - Maternal Grandparents; Diabetes: Yes - Father,Paternal Grandparents; Heart Disease: Yes - Maternal  Grandparents; Hereditary Spherocytosis: No; Hypertension: Yes - Father,Paternal Grandparents; Kidney Disease: No; Lung Disease: Yes - Siblings; Seizures: No; Stroke: No; Thyroid Problems: No; Tuberculosis: No; Never smoker; Marital Status - Married; Alcohol Use: Never; Drug Use: No History; Caffeine Use: Daily - tea, soda; Financial Concerns: No; Food, Clothing or Shelter Needs: No; Support System Lacking: No; Transportation Concerns: No Electronic Signature(s) Signed: 12/22/2022 4:03:18 PM By: [name] MD FACS Entered By: [name] on 12/22/2022 12:18:59 -------------------------------------------------------------------------------- SuperBill Details Patient Name: Date of Service: [name], [name] 12/22/2022 Medical Record Number: [id number] Patient Account Number: [id number] Date of Birth/Sex: Treating RN: 1975/09/27 (47 y.o. [name] Primary Care Provider: Dorinda Hill Other Clinician: Referring Provider: Treating Provider/Extender: Jana Hakim in Treatment: [address] ([id number]) [id number] Page 16 of 16 Diagnosis Coding ICD-10 Codes Code Description [phone] Non-pressure chronic ulcer of other part of left foot with fat layer exposed Facility Procedures : CPT4 Code: [id number] Description: 97597 - DEBRIDE WOUND 1ST 20 SQ CM OR < ICD-10 Diagnosis Description L97.522 Non-pressure chronic ulcer of other part of left foot with fat layer exposed Modifier: Quantity: 1 Physician Procedures : CPT4 Code Description Modifier [id number] - WC PHYS LEVEL 4 - EST PT 25 ICD-10 Diagnosis Description L97.522 Non-pressure chronic ulcer of other part of left foot with fat layer exposed Quantity: 1 : [id number] - WC PHYS DEBR WO ANESTH 20 SQ CM ICD-10 Diagnosis Description L97.522 Non-pressure chronic ulcer of other part of left foot with fat layer exposed Quantity: 1 Electronic Signature(s) Signed: 12/22/2022  12:23:16 PM By: [name] MD FACS Entered By: [name] on 12/22/2022 12:23:16

## 2023-01-17 NOTE — Progress Notes (Signed)
ERMEL, BOYCHUK ([id number]) [id number] Page 1 of 16 Visit Report for 12/15/2022 Chief Complaint Document Details Patient Name: Date of Service: [name], TO [name] 12/15/2022 10:30 A M Medical Record Number: [id number] Patient Account Number: [id number] Date of Birth/Sex: Treating RN: 19-Aug-1975 (47 y.o. M) Primary Care Provider: Dorinda Hill Other Clinician: Referring Provider: Treating Provider/Extender: Jana Hakim in Treatment: 126 Information Obtained from: Patient Chief Complaint Bilateral Plantar Foot Ulcers Electronic Signature(s) Signed: 12/15/2022 11:31:22 AM By: [name] MD FACS Entered By: [name] on 12/15/2022 11:31:22 -------------------------------------------------------------------------------- Debridement Details Patient Name: Date of Service: [name], [name] 12/15/2022 10:30 A M Medical Record Number: [id number] Patient Account Number: [id number] Date of Birth/Sex: Treating RN: 1976-01-26 (47 y.o. [name] Primary Care Provider: Dorinda Hill Other Clinician: Referring Provider: Treating Provider/Extender: Jana Hakim in Treatment: 126 Debridement Performed for Assessment: Wound #2 Left Calcaneus Performed By: Physician [name], MD Debridement Type: Debridement Level of Consciousness (Pre-procedure): Awake and Alert Pre-procedure Verification/Time Out Yes - 10:50 Taken: Start Time: 10:51 Pain Control: Lidocaine 4% T opical Solution Percent of Wound Bed Debrided: 100% T Area Debrided (cm): otal 7.06 Tissue and other material debrided: Non-Viable, Slough, Slough Level: Non-Viable Tissue Debridement Description: Selective/Open Wound Instrument: Curette Bleeding: Minimum Hemostasis Achieved: Pressure End Time: 10:52 Procedural Pain: 0 Post Procedural Pain: 0 Response to Treatment: Procedure was tolerated well Level of Consciousness (Post- Awake and  Alert procedure): Post Debridement Measurements of Total Wound Length: (cm) 4.5 Stage: Category/Stage III Width: (cm) 2 Depth: (cm) 0.3 Volume: (cm) 2.121 Character of Wound/Ulcer Post Debridement: Improved Post Procedure Diagnosis Same as Pre-procedure Notes Scribed for Dr Lady Gary by Brenton Grills RN. Electronic Signature(s) Signed: 12/15/2022 12:17:36 PM By: [name] MD [name] ([id number]) [id number] Page 2 of 16 Signed: 01/17/2023 7:49:05 AM By: Brenton Grills Entered By: Brenton Grills on 12/15/2022 10:53:09 -------------------------------------------------------------------------------- HPI Details Patient Name: Date of Service: [name], [name] 12/15/2022 10:30 A M Medical Record Number: [id number] Patient Account Number: [id number] Date of Birth/Sex: Treating RN: [birth year] (47 y.o. M) Primary Care Provider: Dorinda Hill Other Clinician: Referring Provider: Treating Provider/Extender: Jana Hakim in Treatment: 126 History of Present Illness HPI Description: Wounds are12/03/2020 upon evaluation today patient presents for initial inspection here in our clinic concerning issues he has been having with the bottoms of his feet bilaterally. He states these actually occurred as wounds when he was hospitalized for 5 months secondary to Covid. He was apparently with tilting bed where he was in an upright position quite frequently and apparently this occurred in some way shape or form during that time. Fortunately there is no sign of active infection at this time. No fevers, chills, nausea, vomiting, or diarrhea. With that being said he still has substantial wounds on the plantar aspects of his feet Theragen require quite a bit of work to get these to heal. He has been using Santyl currently though that is been problematic both in receiving the medication as well as actually paid for it as it is become quite expensive.  Prior to the experience with Covid the patient really did not have any major medical problems other than hypertension he does have some mild generalized weakness following the Covid experience. 07/22/2020 on evaluation today patient appears to be doing okay in regard to his foot ulcers I feel like the wound beds are showing signs of better improvement that I do believe the Iodoflex is helping  in this regard. With that being said he does have a lot of drainage currently and this is somewhat blue/green in nature which is consistent with Pseudomonas. I do think a culture today would be appropriate for [place] to evaluate and see if that is indeed the case I would likely start him on antibiotic orally as well he is not allergic to Cipro knows of no issues he has had in the past 12/21; patient was admitted to the clinic earlier this month with bilateral presumed pressure ulcers on the bottom of his feet apparently related to excessive pressure from a tilt table arrangement in the intensive care unit. Patient relates this to being on ECMO but I am not really sure that is exactly related to that. I must say I have never seen anything like this. He has fairly extensive full-thickness wounds extending from his heel towards his midfoot mostly centered laterally. There is already been some healing distally. He does not appear to have an arterial issue. He has been using gentamicin to the wound surfaces with Iodoflex to help with ongoing debridement 1/6; this is a patient with pressure ulcers on the bottom of his feet related to excessive pressure from a standing position in the intensive care unit. He is complaining of a lot of pain in the right heel. He is not a diabetic. 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 We have been using Iodoflex to help prepare the surfaces of both wounds for an advanced treatment product. He is nonambulatory spending most of his time in a wheelchair I have asked  him not to propel the wheelchair with his heels 1/13; in general his wounds look better not much surface area change we have been using Iodoflex as of last week. I did an x-ray of the right heel as the patient was complaining of pain. I had some thoughts about a stress fracture perhaps Achilles tendon problems however what it showed was erosive changes along the inferior aspect of the calcaneus he now has a MRI booked for 1/20. 1/20; in general his wounds continue to be better. Some improvement in the large narrow areas proximally in his foot. He is still complaining of pain in the right heel and tenderness in certain areas of this wound. His MRI is tonight. I am not just looking for osteomyelitis that was brought up on the x-ray I am wondering about stress fractures, tendon ruptures etc. He has no such findings on the left. Also noteworthy is that the patient had critical illness neuropathy and some of the discomfort may be actual improvement in nerve function I am just not sure. These wounds were initially in the setting of severe critical illness related to COVID-19. He was put in a standing position. He may have also been on pressors at the point contributing to tissue ischemia. By his description at some point these wounds were grossly necrotic extending proximally up into the Achilles part of his heel. I do not know that I have ever really seen pictures of them like this although they may exist in epic We have ordered Tri layer Oasis. I am trying to stimulate some granulation in these areas. This is of course assuming the MRI is negative for infection 1/27; since the patient was last here he saw Dr. Earlene Plater of infectious disease. He is planned for vancomycin and ceftriaxone. Prior operative culture grew MSSA. Also ordered baseline lab work. He also ordered arterial studies although the ABIs in our clinic were  normal as well as his clinical exam these were normal I do not think he needs to  see vascular surgery. His ABIs at the [facility] were 1.22 in the right triphasic waveforms with a normal TBI of 1.15 on the left ABI of 1.22 with triphasic waveforms and a normal TBI of 1.08. Finally he saw Dr. Logan Bores who will follow him in for 2 months. At this point I do not think he felt that he needed a procedure on the right calcaneal bone. Dr. Earlene Plater is elected for broad-spectrum antibiotic The patient is still having pain in the right heel. He walks with a walker 2/3; wounds are generally smaller. He is tolerating his IV antibiotics. I believe this is vancomycin and ceftriaxone. We are still waiting for Oasis burn in terms of his out-of-pocket max which he should be meeting soon given the IV antibiotics, MRIs etc. I have asked him to check in on this. We are using silver collagen in the meantime the wounds look better 2/10; tolerating IV vancomycin and Rocephin. We are waiting to apply for Oasis. Although I am not really sure where he is in his out-of-pocket max. 2/17 started the first application of Oasis trilayer. Still on antibiotics. The wounds have generally look better. The area on the left has a little more surface slough requiring debridement 2/24; second application of Oasis trilayer. The wound surface granulation is generally look better. The area on the left with undermining laterally I think is come in a bit. 10/08/2020 upon evaluation today patient is here today for [employer] application #3. Fortunately he seems to be doing extremely well with regard to this and we are seeing a lot of new epithelial growth which is great news. Fortunately there is no signs of active infection at this time. 10/16/2020 upon evaluation today patient appears to be doing well with regard to his foot ulcers. Do believe the Oasis has been of benefit for him. I do not see any signs of infection right now which is great news and I think that he has a lot of new epithelial growth which is great to see as well.  The patient is very pleased to hear all of this. I do think we can proceed with the Oasis trilayer #4 today. 3/18; not as much improvement in these areas on his heels that I was hoping. I did reapply trilateral Oasis today the tissue looks healthier but not as much fill in as I was hoping. 3/25; better looking today I think this is come in a bit the tissue looks healthier. Triple layer Oasis reapplied #6 4/1; somewhat better looking definitely better looking surface not as much change in surface area as I was hoping. He may be spending more time [place] on days then he needs to although he does have heel offloading boots. Triple layer Oasis reapplied [name], [name] ([id number]) [id number] Page 3 of 16 4/7; unfortunately apparently [employer] will not approve any further Oasis which is unfortunate since the patient did respond nicely both in terms of the condition of the wound bed as well as surface area. There is still some drainage coming from the wound but not a lot there does not appear to be any infection 4/15; we have been using Hydrofera Blue. He continues to have nice rims of epithelialization on the right greater than the left. The left the epithelialization is coming from the tip of his heel. There is moderate drainage. In this that concerns me about  a total contact cast. There is no evidence of infection 4/29; patient has been using Hydrofera Blue with dressing changes. He has no complaints or issues today. 5/5; using Hydrofera Blue. I actually think that he looks marginally better than the last time I saw this 3 weeks ago. There are rims of epithelialization on the left thumb coming from the medial side on the right. Using Hydrofera Blue 5/12; using Hydrofera Blue. These continue to make improvements in surface area. His drainage was not listed as severe I therefore went ahead and put a cast on the left foot. Right foot we will continue to dress  his previous 5/16; back for first total contact cast change. He did not tolerate this particularly well cast injury on the anterior tibia among other issues. Difficulty sleeping. I talked him about this in some detail and afterwards is elected to continue. I told him I would like to have a cast on for 3 weeks to see if this is going to help at all. I think he agreed 5/19; I think the wound is better. There is no tunneling towards his midfoot. The undermining medially also looks better. He has a rim of new skin distally. I think we are making progress here. The area on the left also continues to look somewhat better to me using Hydrofera Blue. He has a list of complaints about the cast but none of them seem serious 5/26; patient presents for 1 week follow-up. He has been using a total contact cast and tolerating this well. Hydrofera Blue is the main dressing used. He denies signs of infection. 6/2 Hydrofera Blue total contact cast on the left. These were large ulcers that formed in intensive care unit where the patient was recovering from COVID. May have had something to do with being ventilated in an upright positiono Pressors etc. We have been able to get the areas down considerably and a viable surface. There is some epithelialization in both sides. Note made of drainage 6/9; changed to [facility] last time because of drainage. He arrives with better looking surfaces and dimensions on the left than the right. Paradoxically the right actually probes more towards his midfoot the left is largely close down but both of these look improved. Using a total contact cast on the left 6/16; complex wounds on his bilateral plantar heels which were initially pressure injury from a stay in the ICU with COVID. We have been using silver alginate most recently. His dimensions of come in quite dramatically however not recently. We have been putting the left foot in a total contact cast 6/23; complex wounds on  the bilateral plantar heels. I been putting the left in the cast paradoxically the area on the right is the one that is going towards closure at a faster rate. Quite a bit of drainage on the left. The patient went to see Dr. Logan Bores who said he was going to standby for skin grafts. I had actually considered sending him for skin grafts however he would be mandatorily off his feet for a period of weeks to months. I am thinking that the area on the right is going to close on its own the area on the left has been more stubborn even though we have him in a total contact cast 6/30; took him out of a total contact cast last week is the right heel seem to be making better progress than the left where I was placing the cast. We are using silver alginate. Both  wounds are smaller right greater than left 7/12; both wounds look as though they are making some progress. We are using silver alginate. Heel offloading boots 7/26; very gradual progress especially on the right. Using silver alginate. He is wearing heel offloading boots 8/18; he continues to close these wounds down very gradually. Using silver alginate. The problem polymen being definitive about this is areas of what appears to be callus around the margins. This is not a 100% of the area but certainly sizable especially on the right 9/1; bilateral plantar feet wounds secondary to prolonged pressure while being ventilated for COVID-19 in an upright position. Essentially pressure ulcers on the bottom of his feet. He is made substantial progress using silver alginate. 9/14; bilateral plantar feet wounds secondary to prolonged pressure. Making progress using silver alginate. 9/29 bilateral plantar feet wounds secondary to prolonged pressure. I changed him to Iodoflex last week. MolecuLight showing reddened blush fluorescence 10/11; patient presents for follow-up. He has no issues or complaints today. He denies signs of infection. He continues to use Iodoflex  and antibiotic ointment to the wound beds. 10/27; 2-week follow-up. No evidence of infection. He has callus and thick dry skin around the wound margins we have been using Iodoflex and Bactroban which was in response to a moderate left MolecuLight reddish blush fluorescence. 11/10; 2-week follow-up. Wound margins again have thick callus however the measurements of the actual wound sites are a lot smaller. Everything looks reasonably healthy here. We have been using Iodoflex He was approved for prime matrix but I have elected to delay this given the improvement in the surface area. Hopefully I will not regret that decision as were getting close to the end of the year in terms of insurance payment 12/8; 2-week follow-up. Wounds are generally smaller in size. These were initially substantial wounds extending into the forefoot all the way into the heel on the bilateral plantar feet. They are now both located on the plantar heel distal aspect both of these have a lot of callus around the wounds I used a #5 curette to remove this on the right and the left also some subcutaneous debris to try and get the wound edges were using Iodoflex. He has heel offloading shoe 12/22; 2-week follow-up. Not really much improvement. He has thick callus around the outer edges of both wounds. I remove this there is some nonviable subcutaneous tissue as well. We have been using Iodoflex. Her intake nurse and myself spontaneously thought of a total contact cast I went back in May. At that time we really were not seeing much of an improvement with a cast although the wound was in a much different situation I would like to retry this in 2 weeks and I discussed this with the patient 08/12/2021; the patient has had some improvement with the Iodoflex. The the area on the left heel plantar more improved than the right. I had to put him in a total contact cast on the left although I decided to put that off for 2 weeks. I am going to  change his primary dressing to silver collagen. I think in both areas he has had some improvement most of the healing seems to be more proximal in the heel. The wounds are in the mid aspect. A lot of thick callus on the right heel however. 1/19; we are using silver collagen on both plantar heel areas. He has had some improvement today. The left did not require any debridement. He still had some eschar  on the right that was debrided but both seem to have contracted. I did not put it total contact cast on him today 2/2 we have been using silver collagen. The area on the right plantar heel has areas that appear to be epithelialized interspersed with dry flaking callus and dry skin. I removed this. This really looks better than on the other side. On the left still a large area with raised edges and debris on the surface. 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 He has no issues or complaints today. 2/9; not much change in the left foot wound with 1 week of a cast we are using silver collagen. Silver collagen on the right side. The right side has been the better wound surface. We will reapply the total contact cast on the left 2/16; not much improvement on either side I been using silver collagen with a total contact cast on the left. I'm changing the Hydrofera Blue still with a total contact cast on the left 2/23; some improvement on both sides. Disappointing that he has thick callus around the area that we are putting in a total contact cast on the left. We've been using Hydrofera Blue on both wound areas. This is a man who at essentially pressure ulcers in addition to ischemia caused by medications to support his blood pressure (pressors) in the ICU. He was being ventilated in the standing position for severe Covid. A Shiley the wounds extended across his entire foot but  are now localized to his plantar heels bilaterally. We have made progress however neither areas healed. I continue to think the total contact cast is helped albeit painstakingly slowly. He has never wanted a plastic surgery consult although I don't know that they would be interested in grafting in area in this location. 10/07/2021: Continued improvement bilaterally. There is still some callus around the left wound, despite the total contact cast. He has some increased pain in his right midfoot around 1 particular area. This has been painful in the past but seems to be a little bit worse. When his cast was removed today, there was an area on the heel of the left foot that looks a bit macerated. He is also complaining of pain in his left thigh and hip which he thinks is secondary to the limb length discrepancy caused by the cast. 10/14/2021: He continues to improve. A little bit less callus around the left wound. He continues to endorse pain in his right midfoot, but this is not as significant as it was last week. The maceration on his left heel is improved. [name], [name] ([id number]) [id number] Page 4 of 16 10/21/2021: Continued improvement to both wounds. The maceration on his left heel is no longer evident. Less callus bilaterally. Epithelialization progressing. 10/28/2021: Significant improvement this week. The right sided wound is nearly closed with just a small open area at the middle. No maceration seen on the left heel. Continued epithelialization on both sides. No concern for infection. 11/04/2021: T oday, the wounds were measured a little bit differently and come out as larger, but I actually think they are about the same to potentially even smaller, particularly on the left. He continues to accumulate some callus on the right. 11/11/2021: T oday, the patient is expressing some concern that the left wound, despite being in the total contact cast, is not progressing  at the  same rate as the 1 on the right. He is interested in trying a week without the cast to see how the wound does. The wounds are roughly the same size as last week, with the right perhaps being a little bit smaller. He continues to build up callus on both sites. 11/18/2021: Last week, I permitted the patient to go without his total contact cast, just to see if the cast was really making any difference. Today, both wounds have deteriorated to some extent, suggesting that the cast is providing benefit, at least on the left. Both are larger and have accumulated callus, slough, and other debris. 11/26/2021: I debrided both wounds quite aggressively last week in an effort to stimulate the healing cascade. This appears to have been effective as the left sided wound is a full centimeter shorter in length. Although the right was measured slightly larger, on inspection, it looks as though an area of epithelialized tissue was included in the measurements. We have been using PolyMem Ag on the wound surfaces with a total contact cast to the left. 12/02/2021: It appears that the intake personnel are including epithelialized tissue in his wound measurements; the right wound is almost completely epithelialized; there is just a crater at the proximal midfoot with some open areas. On the left, he has built up some callus, but the overall wound surface looks good. There is some senescent skin around the wound margin. He has been in PolyMem Ag bilaterally with a total contact cast on the left. 12/09/2021: The right wound is nearly closed; there is just a small open area at the mid calcaneus. On the left, the wound is smaller with minimal callus buildup. No significant drainage. 12/16/2021: The right calcaneal wound remains minimally open at the mid calcaneus; the rest has epithelialized. On the left, the wound is also a little bit smaller. There is some senescent tissue on the periphery. He is getting his first application of a  trial skin substitute called Vendaje today. 12/23/2021: The wound on his right calcaneus is nearly closed; there is just a small area at the most distal aspect of the calcaneus that is open. On the left, the area where we applied to the skin substitute has a healthier-looking bed of granulation tissue. The wound dimensions are not significantly different on this side but the wound surface is improved. 12/30/2021: The wound on the right calcaneus has not changed significantly aside from some accumulation of callus. On the left, the open area is smaller and continues to have an improved surface. He continues to accumulate callus around the wound. He is here for his third application of Vendaje. 01/06/2022: The right calcaneal wound is down to just a couple of millimeters. He continues to accumulate periwound callus. He unfortunately got his cast wet earlier in the week and his left foot is macerated, resulting in some superficial skin loss just distal to the open wound. The open wound itself, however, is much smaller and has a healthier appearing surface. He is here for his fourth application of Vendaje. 01/13/2022: The right calcaneal wound is about the same. Unfortunately, once again, his cast got wet and his foot is again macerated. This is caused the left calcaneal wound to enlarge. He is here for his fifth application of Vendaje. 01/20/2022: The right calcaneal wound is very small. There is some periwound callus accumulation. He purchased a new cast protector last week and this has been effective in avoiding the maceration that has been occurring  on the left. The left calcaneal wound is narrower and has a healthy and viable-appearing surface. He is here for his 6 application of Vendaje. 01/27/2022: The right calcaneal wound is down to just a pinhole. There is some periwound slough and callus. On the left, the wound is narrower and shorter by about a centimeter. The surface is robust and viable-appearing.  Unfortunately, the rep for the trial skin substitute product did not provide any for [place] to use today. 02/04/2022: The right calcaneal wound remains unchanged. There is more accumulated callus. On the left, although the intake nurse measured it a little bit longer, it looks about the same to me. The surface has a layer of slough, but underneath this, there is good granulation tissue. 02/10/2022: The right calcaneus wound is nearly closed. There is still some callus that builds up around the site. The left side looks about the same in terms of dimensions, but the surface is more robust and vital-appearing. 02/16/2022: The area of the right calcaneus that was nearly closed last week has closed, but there is a small opening at the mid foot where it looks like some moisture got retained and caused some reopening. The left foot wound is narrower and shallower. Both sites have a fair amount of periwound callus and eschar. 02/24/2022: The small midfoot opening on the right calcaneus is a little bit smaller today. The left foot wound is narrower and shallower. He continues to accumulate periwound callus. No concern for infection. 03/01/2022: The patient came to clinic early because he showered and got his cast wet. Fortunately, there is no significant maceration to his foot but the callus softened and it looks like the wound on his left calcaneus may be a little bit wider. The wound on his right calcaneus is just a narrow slit. Continued accumulation of periwound callus bilaterally. 03/08/2022: The wound on his right calcaneus is very nearly closed, just a small pinpoint opening under a bit of eschar; the left wound has come in quite a bit, as well. It is narrower and shorter than at our last visit. Still with accumulated callus and eschar bilaterally. 03/17/2022: The right calcaneal wound is healed. The left wound is smaller and the surface itself is very clean, but there is some blue-green staining on the periwound  callus, concerning for Pseudomonas aeruginosa. 03/23/2022: The right calcaneal wound remains closed. The left wound continues to contract. No further blue-green staining. Small amount of callus and slough accumulation. 03/28/2022: He came in early today because he had gotten his cast wet. On inspection, the wound itself did not get wet or macerated, just a little bit of the forefoot. The wound itself is basically unchanged. 04/07/2022: The right foot wound remains closed. The left wound is the smallest that I have seen it to date. It is narrower and shorter. It still continues to accumulate slough on the surface. 04/15/2022: There is a band of epithelium now dividing the small left plantar foot wound in 2. There is still some slough on the surface. 04/21/2022: The wound continues to narrow. Just a little bit of slough on the surface. He seems to be responding well to endoform. 04/28/2022: Continued slow contraction of the wound. There is a little slough on the surface and some periwound callus. We have been using endoform and total contact cast. 05/05/2022: The wound appears to have stalled. There is slough and some periwound eschar/callus. No concern for infection, however. [name], [name] ([id number]) [id number] Page 5 of 16 05/12/2022: Unfortunately,  his right foot has reopened. It is located at the most posterior aspect of his surgical incision. The area was noted to have drainage coming from it when his padding was removed today. Underneath some callus and senescent skin, there is an opening. No purulent drainage or malodor. On the left foot, the wound is again unchanged. There is some light blue staining on the callus, but no malodor or purulent drainage. 10/13; right and left heel remanence of extensive plantar foot wounds. These are better than I remember by quite a big margin however he is still left with wounds on the left plantar heel and the right plantar heel. Been  using endoform bilaterally. A culture was done that showed apparently Pseudomonas but we are still waiting for the [facility] antibiotic to use gentamicin today. He is still very active by description I am not sure about the offloading of his noncasted right foot 10/20; both wounds right and left heel debrided not much change from last week. [name] has arrived which is linezolid, gentamicin and ciprofloxacin we will use this with endoform. T contact cast on the left otal 06/02/2022: Both wounds are smaller today. There is still a fair amount of callus buildup around the right foot ulcer. The left is more superficial and nearly flush with the surrounding tissues. Also with slough and eschar buildup. 06/10/2022: The right sided wound appears to be nearly closed, if not completely so, although it is somewhat difficult to tell given the abnormal tissue and scarring in his foot. There is a fair amount of callus and crust accumulation. On the left, the wound looks about the same, again with callus and slough. He has an appointment next Thursday with Dr. Annamary Rummage in podiatry; I am hopeful that there may be some reconstructive options available for Mr. Denley. 06/16/2022: Both wounds have some eschar and callus accumulation. The right sided wound is extremely narrow and barely open; the left is narrower than last week. There is a little bit of slough. He has his appointment in podiatry later today. 06/23/2022: The patient met with Dr. Annamary Rummage last week and unfortunately, there are no reconstructive options that he believes would be helpful. He did order an MRI to evaluate for osteomyelitis and fortunately, none was seen. The left sided wound is a little bit shorter and narrower today. The right sided wound is about the same. There is callus and eschar accumulation bilaterally. 06/29/2022: Both feet have improved from last week. There is epithelium making a valiant effort to creep across the surface on  the left. The right side looks like it got a little dry and the deep crevasse in his midfoot has cracked. Both have eschar and there is some slough on the left. 07/07/2022: Both feet have improved. There is epithelium completely covering the calcaneus on the right with just a small opening in the crevasse in his midfoot. On the left, the open area of tissue is smaller but he continues to build up callus/eschar and slough. 07/15/2022: The opening in the midfoot on the right is about the same size, covered with eschar and a little bit of slough. The open portion of the left wound is narrower and shorter with a bit of slough buildup. He admits to being on his feet more than recommended. 12/14; as far as I can tell everything on the right foot is closed. There is some eschar I removed some of this I cannot identify any open wound here. As usual this will be a very vulnerable  area going forward. On the left this looks really quite healthy. I was pleasantly surprised to see how good this looked. Wound is certainly smaller and there appears to be healthy epithelialization. He has been using Promogran on the right and endoform on the left. He has been offloading the right foot with a heel offloading boot and he has a running shoe on the right foot 08/04/2022: The right foot remains closed. He has a thick cushioned insole in his sneaker. The left sided wound is smaller with just some slough and eschar accumulation. He is wearing the heel off loader on this foot. 08/15/2022: The right foot remains closed. The left wound has narrowed further. There is some slough and eschar accumulation. 08/25/2022: We put him in a peg assist shoe insert and as a result, he has more epithelialization of the ulcer with minimal slough and eschar accumulation. 09/01/2022: The wound is smaller by about half this week. Still with some slough on the surface. The peg assist shoe seems to be doing a remarkable job of adequately offloading  the site. 09/08/2022: There is a little bit more epithelium coming in. There is some slough and callus buildup. 09/16/2022: The wound measures about the same size, but the epithelium that has grown in looks more robust and stronger. There is some slough and callus buildup. 09/23/2022: The wound remains about the same size. The skin edges are looking rather senescent. 09/29/2022: The aggressive debridement I performed last week seems to have been effective. The wound is smaller and has significantly less slough accumulation. 10/06/2022: There has been quite a bit of epithelialization since last week. There are still some open areas with slough accumulation. There is some callus buildup around the perimeter. 10/13/2022: Continued improvement. Minimal callus accumulation. 3/14; patient presents for follow-up. He has been using endoform to the wound bed without issues. He is using a surgical shoe with peg assist for offloading. 10/27/2022: The wound dimensions are stable. There is some senescent skin accumulation around the perimeter. 11/04/2022: Last week I performed a very aggressive debridement in an effort to stimulate the healing cascade. As has been the case when I have done this before, the wound has responded well. There has been epithelialization and contraction of the wound. There are just a couple of small open areas. 11/10/2022: Unfortunately, his foot got wet secondary to sweating and there has been some breakdown of the tissue, particularly the skin just adjacent to the main ulcer. 11/18/2022: We continue to struggle with moisture-related tissue breakdown around the perimeter of his wound. This has caused the thin epithelium that had formed on the surface to disintegrate. The underlying surface of the wound has good granulation tissue. 11/25/2022: The edges of the wound are much less macerated, but the surface is actually looking a little bit dry. There is slough accumulation. No obvious signs of  infection. 12/01/2022: The wound edges are more macerated and broken down, making the wound larger. The surface has some slough on it. 12/08/2020: The wound looks better this week. There is significantly less maceration and moisture-related tissue breakdown. There is some slough on the wound surface. 12/15/2022: The wound continues to improve. There is almost no moisture-related tissue breakdown. There is epithelium beginning to fill in again from the edges. Light slough on the wound surface. [name], [name] ([id number]) [id number] Page 6 of 16 Electronic Signature(s) Signed: 12/15/2022 11:32:03 AM By: [name] MD FACS Entered By: [name] on 12/15/2022 11:32:03 -------------------------------------------------------------------------------- Physical Exam Details Patient Name: Date  of Service: [name], TO [name] 12/15/2022 10:30 A M Medical Record Number: [id number] Patient Account Number: [id number] Date of Birth/Sex: Treating RN: [birth year] (47 y.o. M) Primary Care Provider: Dorinda Hill Other Clinician: Referring Provider: Treating Provider/Extender: Jana Hakim in Treatment: 126 Constitutional . . . . no acute distress. Respiratory Normal work of breathing on room air. Notes 12/15/2022: The wound continues to improve. There is almost no moisture-related tissue breakdown. There is epithelium beginning to fill in again from the edges. Light slough on the wound surface. Electronic Signature(s) Signed: 12/15/2022 11:32:47 AM By: [name] MD FACS Entered By: [name] on 12/15/2022 11:32:47 -------------------------------------------------------------------------------- Physician Orders Details Patient Name: Date of Service: [name], [name] 12/15/2022 10:30 A M Medical Record Number: [id number] Patient Account Number: [id number] Date of Birth/Sex: Treating RN: 1975/09/27 (47 y.o. [name] Primary Care  Provider: Dorinda Hill Other Clinician: Referring Provider: Treating Provider/Extender: Jana Hakim in Treatment: 126 Verbal / Phone Orders: No Diagnosis Coding ICD-10 Coding Code Description [phone] Non-pressure chronic ulcer of other part of left foot with fat layer exposed Follow-up Appointments ppointment in 1 week. - Dr. Lady Gary Room 3 Return A Anesthetic Wound #2 Left Calcaneus (In clinic) Topical Lidocaine 4% applied to wound bed - In clinic Bathing/ Shower/ Hygiene May shower and wash wound with soap and water. Edema Control - Lymphedema / SCD / Other Bilateral Lower Extremities Avoid standing for long periods of time. Moisturize legs daily. - as needed Off-Loading Other: - keep pressure off of the bottom of your feet. Elevate legs throughout the day. Use the Shoe with the PegAssist off-loading insole Additional Orders / Instructions Follow Nutritious Diet - Try to get 70-100 grams of Protein a day+ Wound Treatment ATREUS, HASZ ([id number]) [id number] Page 7 of 16 Wound #2 - Calcaneus Wound Laterality: Left Cleanser: Normal Saline (Generic) 1 x Per Day/30 Days Discharge Instructions: Cleanse the wound with Normal Saline prior to applying a clean dressing using gauze sponges, not tissue or cotton balls. Cleanser: Wound Cleanser (Generic) 1 x Per Day/30 Days Discharge Instructions: Cleanse the wound with wound cleanser prior to applying a clean dressing using gauze sponges, not tissue or cotton balls. Peri-Wound Care: Zinc Oxide Ointment 30g tube 1 x Per Day/30 Days Discharge Instructions: Apply Zinc Oxide to periwound with each dressing change Topical: Gentamicin 1 x Per Day/30 Days Discharge Instructions: hold Topical: compounding topical antibiotics from [facility] 1 x Per Day/30 Days Discharge Instructions: HOLD Prim Dressing: Maxorb Extra Ag+ Alginate Dressing, 4x4.75 (in/in) 1 x Per Day/30  Days ary Discharge Instructions: Apply to wound bed as instructed Secondary Dressing: Drawtex 4x4 in 1 x Per Day/30 Days Discharge Instructions: Apply over primary dressing as directed. Secondary Dressing: Optifoam Non-Adhesive Dressing, 4x4 in (Generic) 1 x Per Day/30 Days Discharge Instructions: Apply over primary dressing as directed. Secondary Dressing: Woven Gauze Sponge, Non-Sterile 4x4 in (Generic) 1 x Per Day/30 Days Discharge Instructions: Apply over primary dressing as directed. Secured With: 61M Medipore H Soft Cloth Surgical T ape, 4 x 10 (in/yd) (Generic) 1 x Per Day/30 Days Discharge Instructions: Secure with tape as directed. Compression Wrap: Kerlix Roll 4.5x3.1 (in/yd) 1 x Per Day/30 Days Discharge Instructions: Apply Kerlix and Coban compression as directed. Add-Ons: [employer] Offloading Boot, Size Regular 1 x Per Day/30 Days Electronic Signature(s) Signed: 12/15/2022 12:17:36 PM By: [name] MD FACS Entered By: [name] on 12/15/2022 11:33:03 -------------------------------------------------------------------------------- Problem List Details Patient Name: Date of  Service: [name], [name] 12/15/2022 10:30 A M Medical Record Number: [id number] Patient Account Number: [id number] Date of Birth/Sex: Treating RN: [birth year] (47 y.o. M) Primary Care Provider: Dorinda Hill Other Clinician: Referring Provider: Treating Provider/Extender: Jana Hakim in Treatment: 126 Active Problems ICD-10 Encounter Code Description Active Date MDM Diagnosis L97.522 Non-pressure chronic ulcer of other part of left foot with fat layer exposed 09/03/2020 No Yes Inactive Problems ICD-10 Code Description Active Date Inactive Date L97.512 Non-pressure chronic ulcer of other part of right foot with fat layer exposed 09/03/2020 09/03/2020 L89.893 Pressure ulcer of other site, stage 3 07/15/2020 07/15/2020 M62.81 Muscle weakness (generalized) 07/15/2020  07/15/2020 [name] ([id number]) [id number] Page 8 of 16 I10 Essential (primary) hypertension 07/15/2020 07/15/2020 M86.171 Other acute osteomyelitis, right ankle and foot 09/03/2020 09/03/2020 Resolved Problems Electronic Signature(s) Signed: 12/15/2022 11:31:05 AM By: [name] MD FACS Entered By: [name] on 12/15/2022 11:31:05 -------------------------------------------------------------------------------- Progress Note Details Patient Name: Date of Service: [name], [name] 12/15/2022 10:30 A M Medical Record Number: [id number] Patient Account Number: [id number] Date of Birth/Sex: Treating RN: 04/04/1976 (47 y.o. M) Primary Care Provider: Dorinda Hill Other Clinician: Referring Provider: Treating Provider/Extender: Jana Hakim in Treatment: 126 Subjective Chief Complaint Information obtained from Patient Bilateral Plantar Foot Ulcers History of Present Illness (HPI) Wounds are12/03/2020 upon evaluation today patient presents for initial inspection here in our clinic concerning issues he has been having with the bottoms of his feet bilaterally. He states these actually occurred as wounds when he was hospitalized for 5 months secondary to Covid. He was apparently with tilting bed where he was in an upright position quite frequently and apparently this occurred in some way shape or form during that time. Fortunately there is no sign of active infection at this time. No fevers, chills, nausea, vomiting, or diarrhea. With that being said he still has substantial wounds on the plantar aspects of his feet Theragen require quite a bit of work to get these to heal. He has been using Santyl currently though that is been problematic both in receiving the medication as well as actually paid for it as it is become quite expensive. Prior to the experience with Covid the patient really did not have any major medical problems other  than hypertension he does have some mild generalized weakness following the Covid experience. 07/22/2020 on evaluation today patient appears to be doing okay in regard to his foot ulcers I feel like the wound beds are showing signs of better improvement that I do believe the Iodoflex is helping in this regard. With that being said he does have a lot of drainage currently and this is somewhat blue/green in nature which is consistent with Pseudomonas. I do think a culture today would be appropriate for [place] to evaluate and see if that is indeed the case I would likely start him on antibiotic orally as well he is not allergic to Cipro knows of no issues he has had in the past 12/21; patient was admitted to the clinic earlier this month with bilateral presumed pressure ulcers on the bottom of his feet apparently related to excessive pressure from a tilt table arrangement in the intensive care unit. Patient relates this to being on ECMO but I am not really sure that is exactly related to that. I must say I have never seen anything like this. He has fairly extensive full-thickness wounds extending from his heel towards his midfoot mostly centered laterally. There  is already been some healing distally. He does not appear to have an arterial issue. He has been using gentamicin to the wound surfaces with Iodoflex to help with ongoing debridement 1/6; this is a patient with pressure ulcers on the bottom of his feet related to excessive pressure from a standing position in the intensive care unit. He is complaining of a lot of pain in the right heel. He is not a diabetic. 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 We have been using Iodoflex to help prepare the surfaces of both wounds for an advanced treatment product. He is nonambulatory spending most of his time in a wheelchair I have asked him not to propel the wheelchair with his heels 1/13; in general his wounds look better not much  surface area change we have been using Iodoflex as of last week. I did an x-ray of the right heel as the patient was complaining of pain. I had some thoughts about a stress fracture perhaps Achilles tendon problems however what it showed was erosive changes along the inferior aspect of the calcaneus he now has a MRI booked for 1/20. 1/20; in general his wounds continue to be better. Some improvement in the large narrow areas proximally in his foot. He is still complaining of pain in the right heel and tenderness in certain areas of this wound. His MRI is tonight. I am not just looking for osteomyelitis that was brought up on the x-ray I am wondering about stress fractures, tendon ruptures etc. He has no such findings on the left. Also noteworthy is that the patient had critical illness neuropathy and some of the discomfort may be actual improvement in nerve function I am just not sure. These wounds were initially in the setting of severe critical illness related to COVID-19. He was put in a standing position. He may have also been on pressors at the point contributing to tissue ischemia. By his description at some point these wounds were grossly necrotic extending proximally up into the Achilles part of his heel. I do not know that I have ever really seen pictures of them like this although they may exist in epic We have ordered Tri layer Oasis. I am trying to stimulate some granulation in these areas. This is of course assuming the MRI is negative for infection 1/27; since the patient was last here he saw Dr. Earlene Plater of infectious disease. He is planned for vancomycin and ceftriaxone. Prior operative culture grew MSSA. Also ordered baseline lab work. He also ordered arterial studies although the ABIs in our clinic were normal as well as his clinical exam these were normal I do not think he needs to see vascular surgery. His ABIs at the [facility] were 1.22 in the right triphasic waveforms with a normal TBI  of 1.15 on the left ABI of 1.22 with triphasic waveforms and a normal TBI of 1.08. Finally he saw Dr. Logan Bores who will follow him in for 2 months. At this point I do not think he felt that he needed a procedure on the right calcaneal bone. Dr. Earlene Plater is elected for broad-spectrum antibiotic [name], [name] ([id number]) [id number] Page 9 of 16 The patient is still having pain in the right heel. He walks with a walker 2/3; wounds are generally smaller. He is tolerating his IV antibiotics. I believe this is vancomycin and ceftriaxone. We are still waiting for Oasis burn in terms of his out-of-pocket max which he should be meeting soon given the  IV antibiotics, MRIs etc. I have asked him to check in on this. We are using silver collagen in the meantime the wounds look better 2/10; tolerating IV vancomycin and Rocephin. We are waiting to apply for Oasis. Although I am not really sure where he is in his out-of-pocket max. 2/17 started the first application of Oasis trilayer. Still on antibiotics. The wounds have generally look better. The area on the left has a little more surface slough requiring debridement 2/24; second application of Oasis trilayer. The wound surface granulation is generally look better. The area on the left with undermining laterally I think is come in a bit. 10/08/2020 upon evaluation today patient is here today for [employer] application #3. Fortunately he seems to be doing extremely well with regard to this and we are seeing a lot of new epithelial growth which is great news. Fortunately there is no signs of active infection at this time. 10/16/2020 upon evaluation today patient appears to be doing well with regard to his foot ulcers. Do believe the Oasis has been of benefit for him. I do not see any signs of infection right now which is great news and I think that he has a lot of new epithelial growth which is great to see as well. The patient is  very pleased to hear all of this. I do think we can proceed with the Oasis trilayer #4 today. 3/18; not as much improvement in these areas on his heels that I was hoping. I did reapply trilateral Oasis today the tissue looks healthier but not as much fill in as I was hoping. 3/25; better looking today I think this is come in a bit the tissue looks healthier. Triple layer Oasis reapplied #6 4/1; somewhat better looking definitely better looking surface not as much change in surface area as I was hoping. He may be spending more time [place] on days then he needs to although he does have heel offloading boots. Triple layer Oasis reapplied #7 4/7; unfortunately apparently [facility] will not approve any further Oasis which is unfortunate since the patient did respond nicely both in terms of the condition of the wound bed as well as surface area. There is still some drainage coming from the wound but not a lot there does not appear to be any infection 4/15; we have been using Hydrofera Blue. He continues to have nice rims of epithelialization on the right greater than the left. The left the epithelialization is coming from the tip of his heel. There is moderate drainage. In this that concerns me about a total contact cast. There is no evidence of infection 4/29; patient has been using Hydrofera Blue with dressing changes. He has no complaints or issues today. 5/5; using Hydrofera Blue. I actually think that he looks marginally better than the last time I saw this 3 weeks ago. There are rims of epithelialization on the left thumb coming from the medial side on the right. Using Hydrofera Blue 5/12; using Hydrofera Blue. These continue to make improvements in surface area. His drainage was not listed as severe I therefore went ahead and put a cast on the left foot. Right foot we will continue to dress his previous 5/16; back for first total contact cast change. He did not tolerate this  particularly well cast injury on the anterior tibia among other issues. Difficulty sleeping. I talked him about this in some detail and afterwards is elected to continue. I told him I would like  to have a cast on for 3 weeks to see if this is going to help at all. I think he agreed 5/19; I think the wound is better. There is no tunneling towards his midfoot. The undermining medially also looks better. He has a rim of new skin distally. I think we are making progress here. The area on the left also continues to look somewhat better to me using Hydrofera Blue. He has a list of complaints about the cast but none of them seem serious 5/26; patient presents for 1 week follow-up. He has been using a total contact cast and tolerating this well. Hydrofera Blue is the main dressing used. He denies signs of infection. 6/2 Hydrofera Blue total contact cast on the left. These were large ulcers that formed in intensive care unit where the patient was recovering from COVID. May have had something to do with being ventilated in an upright positiono Pressors etc. We have been able to get the areas down considerably and a viable surface. There is some epithelialization in both sides. Note made of drainage 6/9; changed to [facility] last time because of drainage. He arrives with better looking surfaces and dimensions on the left than the right. Paradoxically the right actually probes more towards his midfoot the left is largely close down but both of these look improved. Using a total contact cast on the left 6/16; complex wounds on his bilateral plantar heels which were initially pressure injury from a stay in the ICU with COVID. We have been using silver alginate most recently. His dimensions of come in quite dramatically however not recently. We have been putting the left foot in a total contact cast 6/23; complex wounds on the bilateral plantar heels. I been putting the left in the cast paradoxically the area  on the right is the one that is going towards closure at a faster rate. Quite a bit of drainage on the left. The patient went to see Dr. Logan Bores who said he was going to standby for skin grafts. I had actually considered sending him for skin grafts however he would be mandatorily off his feet for a period of weeks to months. I am thinking that the area on the right is going to close on its own the area on the left has been more stubborn even though we have him in a total contact cast 6/30; took him out of a total contact cast last week is the right heel seem to be making better progress than the left where I was placing the cast. We are using silver alginate. Both wounds are smaller right greater than left 7/12; both wounds look as though they are making some progress. We are using silver alginate. Heel offloading boots 7/26; very gradual progress especially on the right. Using silver alginate. He is wearing heel offloading boots 8/18; he continues to close these wounds down very gradually. Using silver alginate. The problem polymen being definitive about this is areas of what appears to be callus around the margins. This is not a 100% of the area but certainly sizable especially on the right 9/1; bilateral plantar feet wounds secondary to prolonged pressure while being ventilated for COVID-19 in an upright position. Essentially pressure ulcers on the bottom of his feet. He is made substantial progress using silver alginate. 9/14; bilateral plantar feet wounds secondary to prolonged pressure. Making progress using silver alginate. 9/29 bilateral plantar feet wounds secondary to prolonged pressure. I changed him to Iodoflex last week. MolecuLight showing  reddened blush fluorescence 10/11; patient presents for follow-up. He has no issues or complaints today. He denies signs of infection. He continues to use Iodoflex and antibiotic ointment to the wound beds. 10/27; 2-week follow-up. No evidence of  infection. He has callus and thick dry skin around the wound margins we have been using Iodoflex and Bactroban which was in response to a moderate left MolecuLight reddish blush fluorescence. 11/10; 2-week follow-up. Wound margins again have thick callus however the measurements of the actual wound sites are a lot smaller. Everything looks reasonably healthy here. We have been using Iodoflex He was approved for prime matrix but I have elected to delay this given the improvement in the surface area. Hopefully I will not regret that decision as were getting close to the end of the year in terms of insurance payment 12/8; 2-week follow-up. Wounds are generally smaller in size. These were initially substantial wounds extending into the forefoot all the way into the heel on the bilateral plantar feet. They are now both located on the plantar heel distal aspect both of these have a lot of callus around the wounds I used a #5 curette to remove this on the right and the left also some subcutaneous debris to try and get the wound edges were using Iodoflex. He has heel offloading shoe 12/22; 2-week follow-up. Not really much improvement. He has thick callus around the outer edges of both wounds. I remove this there is some nonviable subcutaneous tissue as well. We have been using Iodoflex. Her intake nurse and myself spontaneously thought of a total contact cast I went back in May. At that time we really were not seeing much of an improvement with a cast although the wound was in a much different situation I would like to retry this in 2 weeks and I discussed this with the patient 08/12/2021; the patient has had some improvement with the Iodoflex. The the area on the left heel plantar more improved than the right. I had to put him in a total contact cast on the left although I decided to put that off for 2 weeks. I am going to change his primary dressing to silver collagen. I think in both areas he has had some  improvement most of the healing seems to be more proximal in the heel. The wounds are in the mid aspect. A lot of thick callus on the right heel however. 1/19; we are using silver collagen on both plantar heel areas. He has had some improvement today. The left did not require any debridement. He still had some eschar on the right that was debrided but both seem to have contracted. I did not put it total contact cast on him today 2/2 we have been using silver collagen. The area on the right plantar heel has areas that appear to be epithelialized interspersed with dry flaking callus and dry skin. I removed this. This really looks better than on the other side. On the left still a large area with raised edges and debris on the surface. The patient states he is in the heel offloading boots for a prolonged period of time and really does not use any other footwear [name], [name] ([id number]) [id number] Page 10 of 16 2/6; patient presents for first cast exchange. He has no issues or complaints today. 2/9; not much change in the left foot wound with 1 week of a cast we are using silver collagen. Silver collagen on the right side. The right side has  been the better wound surface. We will reapply the total contact cast on the left 2/16; not much improvement on either side I been using silver collagen with a total contact cast on the left. I'm changing the Hydrofera Blue still with a total contact cast on the left 2/23; some improvement on both sides. Disappointing that he has thick callus around the area that we are putting in a total contact cast on the left. We've been using Hydrofera Blue on both wound areas. This is a man who at essentially pressure ulcers in addition to ischemia caused by medications to support his blood pressure (pressors) in the ICU. He was being ventilated in the standing position for severe Covid. A Shiley the wounds extended across his entire foot but are  now localized to his plantar heels bilaterally. We have made progress however neither areas healed. I continue to think the total contact cast is helped albeit painstakingly slowly. He has never wanted a plastic surgery consult although I don't know that they would be interested in grafting in area in this location. 10/07/2021: Continued improvement bilaterally. There is still some callus around the left wound, despite the total contact cast. He has some increased pain in his right midfoot around 1 particular area. This has been painful in the past but seems to be a little bit worse. When his cast was removed today, there was an area on the heel of the left foot that looks a bit macerated. He is also complaining of pain in his left thigh and hip which he thinks is secondary to the limb length discrepancy caused by the cast. 10/14/2021: He continues to improve. A little bit less callus around the left wound. He continues to endorse pain in his right midfoot, but this is not as significant as it was last week. The maceration on his left heel is improved. 10/21/2021: Continued improvement to both wounds. The maceration on his left heel is no longer evident. Less callus bilaterally. Epithelialization progressing. 10/28/2021: Significant improvement this week. The right sided wound is nearly closed with just a small open area at the middle. No maceration seen on the left heel. Continued epithelialization on both sides. No concern for infection. 11/04/2021: T oday, the wounds were measured a little bit differently and come out as larger, but I actually think they are about the same to potentially even smaller, particularly on the left. He continues to accumulate some callus on the right. 11/11/2021: T oday, the patient is expressing some concern that the left wound, despite being in the total contact cast, is not progressing at the same rate as the 1 on the right. He is interested in trying a week without the cast  to see how the wound does. The wounds are roughly the same size as last week, with the right perhaps being a little bit smaller. He continues to build up callus on both sites. 11/18/2021: Last week, I permitted the patient to go without his total contact cast, just to see if the cast was really making any difference. Today, both wounds have deteriorated to some extent, suggesting that the cast is providing benefit, at least on the left. Both are larger and have accumulated callus, slough, and other debris. 11/26/2021: I debrided both wounds quite aggressively last week in an effort to stimulate the healing cascade. This appears to have been effective as the left sided wound is a full centimeter shorter in length. Although the right was measured slightly larger, on inspection, it  looks as though an area of epithelialized tissue was included in the measurements. We have been using PolyMem Ag on the wound surfaces with a total contact cast to the left. 12/02/2021: It appears that the intake personnel are including epithelialized tissue in his wound measurements; the right wound is almost completely epithelialized; there is just a crater at the proximal midfoot with some open areas. On the left, he has built up some callus, but the overall wound surface looks good. There is some senescent skin around the wound margin. He has been in PolyMem Ag bilaterally with a total contact cast on the left. 12/09/2021: The right wound is nearly closed; there is just a small open area at the mid calcaneus. On the left, the wound is smaller with minimal callus buildup. No significant drainage. 12/16/2021: The right calcaneal wound remains minimally open at the mid calcaneus; the rest has epithelialized. On the left, the wound is also a little bit smaller. There is some senescent tissue on the periphery. He is getting his first application of a trial skin substitute called Vendaje today. 12/23/2021: The wound on his right  calcaneus is nearly closed; there is just a small area at the most distal aspect of the calcaneus that is open. On the left, the area where we applied to the skin substitute has a healthier-looking bed of granulation tissue. The wound dimensions are not significantly different on this side but the wound surface is improved. 12/30/2021: The wound on the right calcaneus has not changed significantly aside from some accumulation of callus. On the left, the open area is smaller and continues to have an improved surface. He continues to accumulate callus around the wound. He is here for his third application of Vendaje. 01/06/2022: The right calcaneal wound is down to just a couple of millimeters. He continues to accumulate periwound callus. He unfortunately got his cast wet earlier in the week and his left foot is macerated, resulting in some superficial skin loss just distal to the open wound. The open wound itself, however, is much smaller and has a healthier appearing surface. He is here for his fourth application of Vendaje. 01/13/2022: The right calcaneal wound is about the same. Unfortunately, once again, his cast got wet and his foot is again macerated. This is caused the left calcaneal wound to enlarge. He is here for his fifth application of Vendaje. 01/20/2022: The right calcaneal wound is very small. There is some periwound callus accumulation. He purchased a new cast protector last week and this has been effective in avoiding the maceration that has been occurring on the left. The left calcaneal wound is narrower and has a healthy and viable-appearing surface. He is here for his 6 application of Vendaje. 01/27/2022: The right calcaneal wound is down to just a pinhole. There is some periwound slough and callus. On the left, the wound is narrower and shorter by about a centimeter. The surface is robust and viable-appearing. Unfortunately, the rep for the trial skin substitute product did not provide any  for [place] to use today. 02/04/2022: The right calcaneal wound remains unchanged. There is more accumulated callus. On the left, although the intake nurse measured it a little bit longer, it looks about the same to me. The surface has a layer of slough, but underneath this, there is good granulation tissue. 02/10/2022: The right calcaneus wound is nearly closed. There is still some callus that builds up around the site. The left side looks about the same in terms  of dimensions, but the surface is more robust and vital-appearing. 02/16/2022: The area of the right calcaneus that was nearly closed last week has closed, but there is a small opening at the mid foot where it looks like some moisture got retained and caused some reopening. The left foot wound is narrower and shallower. Both sites have a fair amount of periwound callus and eschar. 02/24/2022: The small midfoot opening on the right calcaneus is a little bit smaller today. The left foot wound is narrower and shallower. He continues to accumulate periwound callus. No concern for infection. 03/01/2022: The patient came to clinic early because he showered and got his cast wet. Fortunately, there is no significant maceration to his foot but the callus softened and it looks like the wound on his left calcaneus may be a little bit wider. The wound on his right calcaneus is just a narrow slit. Continued accumulation of periwound callus bilaterally. 03/08/2022: The wound on his right calcaneus is very nearly closed, just a small pinpoint opening under a bit of eschar; the left wound has come in quite a bit, as well. It is narrower and shorter than at our last visit. Still with accumulated callus and eschar bilaterally. [name], [name] ([id number]) [id number] Page 11 of 16 03/17/2022: The right calcaneal wound is healed. The left wound is smaller and the surface itself is very clean, but there is some blue-green staining on  the periwound callus, concerning for Pseudomonas aeruginosa. 03/23/2022: The right calcaneal wound remains closed. The left wound continues to contract. No further blue-green staining. Small amount of callus and slough accumulation. 03/28/2022: He came in early today because he had gotten his cast wet. On inspection, the wound itself did not get wet or macerated, just a little bit of the forefoot. The wound itself is basically unchanged. 04/07/2022: The right foot wound remains closed. The left wound is the smallest that I have seen it to date. It is narrower and shorter. It still continues to accumulate slough on the surface. 04/15/2022: There is a band of epithelium now dividing the small left plantar foot wound in 2. There is still some slough on the surface. 04/21/2022: The wound continues to narrow. Just a little bit of slough on the surface. He seems to be responding well to endoform. 04/28/2022: Continued slow contraction of the wound. There is a little slough on the surface and some periwound callus. We have been using endoform and total contact cast. 05/05/2022: The wound appears to have stalled. There is slough and some periwound eschar/callus. No concern for infection, however. 05/12/2022: Unfortunately, his right foot has reopened. It is located at the most posterior aspect of his surgical incision. The area was noted to have drainage coming from it when his padding was removed today. Underneath some callus and senescent skin, there is an opening. No purulent drainage or malodor. On the left foot, the wound is again unchanged. There is some light blue staining on the callus, but no malodor or purulent drainage. 10/13; right and left heel remanence of extensive plantar foot wounds. These are better than I remember by quite a big margin however he is still left with wounds on the left plantar heel and the right plantar heel. Been using endoform bilaterally. A culture was done that showed apparently  Pseudomonas but we are still waiting for the [facility] antibiotic to use gentamicin today. He is still very active by description I am not sure about the offloading of his noncasted right foot  10/20; both wounds right and left heel debrided not much change from last week. [name] has arrived which is linezolid, gentamicin and ciprofloxacin we will use this with endoform. T contact cast on the left otal 06/02/2022: Both wounds are smaller today. There is still a fair amount of callus buildup around the right foot ulcer. The left is more superficial and nearly flush with the surrounding tissues. Also with slough and eschar buildup. 06/10/2022: The right sided wound appears to be nearly closed, if not completely so, although it is somewhat difficult to tell given the abnormal tissue and scarring in his foot. There is a fair amount of callus and crust accumulation. On the left, the wound looks about the same, again with callus and slough. He has an appointment next Thursday with Dr. Annamary Rummage in podiatry; I am hopeful that there may be some reconstructive options available for Mr. Dupell. 06/16/2022: Both wounds have some eschar and callus accumulation. The right sided wound is extremely narrow and barely open; the left is narrower than last week. There is a little bit of slough. He has his appointment in podiatry later today. 06/23/2022: The patient met with Dr. Annamary Rummage last week and unfortunately, there are no reconstructive options that he believes would be helpful. He did order an MRI to evaluate for osteomyelitis and fortunately, none was seen. The left sided wound is a little bit shorter and narrower today. The right sided wound is about the same. There is callus and eschar accumulation bilaterally. 06/29/2022: Both feet have improved from last week. There is epithelium making a valiant effort to creep across the surface on the left. The right side looks like it got a little dry and the deep  crevasse in his midfoot has cracked. Both have eschar and there is some slough on the left. 07/07/2022: Both feet have improved. There is epithelium completely covering the calcaneus on the right with just a small opening in the crevasse in his midfoot. On the left, the open area of tissue is smaller but he continues to build up callus/eschar and slough. 07/15/2022: The opening in the midfoot on the right is about the same size, covered with eschar and a little bit of slough. The open portion of the left wound is narrower and shorter with a bit of slough buildup. He admits to being on his feet more than recommended. 12/14; as far as I can tell everything on the right foot is closed. There is some eschar I removed some of this I cannot identify any open wound here. As usual this will be a very vulnerable area going forward. On the left this looks really quite healthy. I was pleasantly surprised to see how good this looked. Wound is certainly smaller and there appears to be healthy epithelialization. He has been using Promogran on the right and endoform on the left. He has been offloading the right foot with a heel offloading boot and he has a running shoe on the right foot 08/04/2022: The right foot remains closed. He has a thick cushioned insole in his sneaker. The left sided wound is smaller with just some slough and eschar accumulation. He is wearing the heel off loader on this foot. 08/15/2022: The right foot remains closed. The left wound has narrowed further. There is some slough and eschar accumulation. 08/25/2022: We put him in a peg assist shoe insert and as a result, he has more epithelialization of the ulcer with minimal slough and eschar accumulation. 09/01/2022: The wound is smaller  by about half this week. Still with some slough on the surface. The peg assist shoe seems to be doing a remarkable job of adequately offloading the site. 09/08/2022: There is a little bit more epithelium coming in.  There is some slough and callus buildup. 09/16/2022: The wound measures about the same size, but the epithelium that has grown in looks more robust and stronger. There is some slough and callus buildup. 09/23/2022: The wound remains about the same size. The skin edges are looking rather senescent. 09/29/2022: The aggressive debridement I performed last week seems to have been effective. The wound is smaller and has significantly less slough accumulation. 10/06/2022: There has been quite a bit of epithelialization since last week. There are still some open areas with slough accumulation. There is some callus buildup around the perimeter. 10/13/2022: Continued improvement. Minimal callus accumulation. 3/14; patient presents for follow-up. He has been using endoform to the wound bed without issues. He is using a surgical shoe with peg assist for offloading. 10/27/2022: The wound dimensions are stable. There is some senescent skin accumulation around the perimeter. [name], [name] ([id number]) [id number] Page 12 of 16 11/04/2022: Last week I performed a very aggressive debridement in an effort to stimulate the healing cascade. As has been the case when I have done this before, the wound has responded well. There has been epithelialization and contraction of the wound. There are just a couple of small open areas. 11/10/2022: Unfortunately, his foot got wet secondary to sweating and there has been some breakdown of the tissue, particularly the skin just adjacent to the main ulcer. 11/18/2022: We continue to struggle with moisture-related tissue breakdown around the perimeter of his wound. This has caused the thin epithelium that had formed on the surface to disintegrate. The underlying surface of the wound has good granulation tissue. 11/25/2022: The edges of the wound are much less macerated, but the surface is actually looking a little bit dry. There is slough accumulation. No obvious  signs of infection. 12/01/2022: The wound edges are more macerated and broken down, making the wound larger. The surface has some slough on it. 12/08/2020: The wound looks better this week. There is significantly less maceration and moisture-related tissue breakdown. There is some slough on the wound surface. 12/15/2022: The wound continues to improve. There is almost no moisture-related tissue breakdown. There is epithelium beginning to fill in again from the edges. Light slough on the wound surface. Patient History Information obtained from Patient. Family History Cancer - Maternal Grandparents, Diabetes - Father,Paternal Grandparents, Heart Disease - Maternal Grandparents, Hypertension - Father,Paternal Grandparents, Lung Disease - Siblings, No family history of Hereditary Spherocytosis, Kidney Disease, Seizures, Stroke, Thyroid Problems, Tuberculosis. Social History Never smoker, Marital Status - Married, Alcohol Use - Never, Drug Use - No History, Caffeine Use - Daily - tea, soda. Medical History Eyes Denies history of Cataracts, Glaucoma, Optic Neuritis Ear/Nose/Mouth/Throat Denies history of Chronic sinus problems/congestion, Middle ear problems Hematologic/Lymphatic Denies history of Anemia, Hemophilia, Human Immunodeficiency Virus, Lymphedema, Sickle Cell Disease Respiratory Patient has history of Asthma Denies history of Aspiration, Chronic Obstructive Pulmonary Disease (COPD), Pneumothorax, Sleep Apnea, Tuberculosis Cardiovascular Patient has history of Angina - with COVID, Hypertension Denies history of Arrhythmia, Congestive Heart Failure, Coronary Artery Disease, Deep Vein Thrombosis, Hypotension, Myocardial Infarction, Peripheral Arterial Disease, Peripheral Venous Disease, Phlebitis, Vasculitis Gastrointestinal Denies history of Cirrhosis , Colitis, Crohns, Hepatitis A, Hepatitis B, Hepatitis C Endocrine Denies history of Type I Diabetes, Type II  Diabetes Genitourinary Denies history of  End Stage Renal Disease Immunological Denies history of Lupus Erythematosus, Raynauds, Scleroderma Integumentary (Skin) Denies history of History of Burn Musculoskeletal Denies history of Gout, Rheumatoid Arthritis, Osteoarthritis, Osteomyelitis Neurologic Denies history of Dementia, Neuropathy, Quadriplegia, Paraplegia, Seizure Disorder Oncologic Denies history of Received Chemotherapy, Received Radiation Psychiatric Denies history of Anorexia/bulimia, Confinement Anxiety Hospitalization/Surgery History - COVID PNA 07/22/2019- 11/14/2019. - 03/27/2020 wound debridement/ skin graft. Medical A Surgical History Notes nd Constitutional Symptoms (General Health) COVID PNA 07/22/2019-11/14/2019 VENT ECMO, foot drop left foot , Genitourinary kidney stone Psychiatric anxiety Objective Constitutional no acute distress. Vitals Time Taken: 10:24 AM, Height: 69 in, Weight: 280 lbs, BMI: 41.3, Temperature: 98.4 F, Pulse: 77 bpm, Respiratory Rate: 20 breaths/min, Blood [name], [name] ([id number]) [id number] Page 13 of 16 Pressure: 113/74 mmHg. Respiratory Normal work of breathing on room air. General Notes: 12/15/2022: The wound continues to improve. There is almost no moisture-related tissue breakdown. There is epithelium beginning to fill in again from the edges. Light slough on the wound surface. Integumentary (Hair, Skin) Wound #2 status is Open. Original cause of wound was Pressure Injury. The date acquired was: 10/07/2019. The wound has been in treatment 126 weeks. The wound is located on the Left Calcaneus. The wound measures 4.5cm length x 2.5cm width x 0.3cm depth; 8.836cm^2 area and 2.651cm^3 volume. There is Fat Layer (Subcutaneous Tissue) exposed. There is a medium amount of serosanguineous drainage noted. The wound margin is thickened. There is medium (34- 66%) red, pink granulation within the wound bed. There is a  medium (34-66%) amount of necrotic tissue within the wound bed including Eschar and Adherent Slough. The periwound skin appearance had no abnormalities noted for color. The periwound skin appearance exhibited: Callus, Scarring, Maceration. The periwound skin appearance did not exhibit: Dry/Scaly. Periwound temperature was noted as No Abnormality. Wound #2 status is Open. Original cause of wound was Pressure Injury. The date acquired was: 10/07/2019. The wound has been in treatment 126 weeks. The wound is located on the Left Calcaneus. The wound measures 4.5cm length x 2cm width x 0.3cm depth; 7.069cm^2 area and 2.121cm^3 volume. There is Fat Layer (Subcutaneous Tissue) exposed. There is no tunneling or undermining noted. There is a medium amount of serosanguineous drainage noted. The wound margin is distinct with the outline attached to the wound base. There is medium (34-66%) red, pink granulation within the wound bed. There is a medium (34-66%) amount of necrotic tissue within the wound bed including Adherent Slough. The periwound skin appearance exhibited: Dry/Scaly, Hemosiderin Staining. The periwound skin appearance did not exhibit: Callus, Crepitus, Excoriation, Induration, Rash, Scarring, Maceration, Atrophie Blanche, Cyanosis, Ecchymosis, Mottled, Pallor, Rubor, Erythema. Periwound temperature was noted as No Abnormality. Assessment Active Problems ICD-10 Non-pressure chronic ulcer of other part of left foot with fat layer exposed Procedures Wound #2 Pre-procedure diagnosis of Wound #2 is a Pressure Ulcer located on the Left Calcaneus . There was a Selective/Open Wound Non-Viable Tissue Debridement with a total area of 7.06 sq cm performed by [name], MD. With the following instrument(s): Curette to remove Non-Viable tissue/material. Material removed includes [facility] after achieving pain control using Lidocaine 4% Topical Solution. No specimens were taken. A time out was conducted at  10:50, prior to the start of the procedure. A Minimum amount of bleeding was controlled with Pressure. The procedure was tolerated well with a pain level of 0 throughout and a pain level of 0 following the procedure. Post Debridement Measurements: 4.5cm length x 2cm width x 0.3cm depth; 2.121cm^3 volume. Post  debridement Stage noted as Category/Stage III. Character of Wound/Ulcer Post Debridement is improved. Post procedure Diagnosis Wound #2: Same as Pre-Procedure General Notes: Scribed for Dr Lady Gary by Brenton Grills RN.Marland Kitchen Plan Follow-up Appointments: Return Appointment in 1 week. - Dr. Lady Gary Room 3 Anesthetic: Wound #2 Left Calcaneus: (In clinic) Topical Lidocaine 4% applied to wound bed - In clinic Bathing/ Shower/ Hygiene: May shower and wash wound with soap and water. Edema Control - Lymphedema / SCD / Other: Avoid standing for long periods of time. Moisturize legs daily. - as needed Off-Loading: Other: - keep pressure off of the bottom of your feet. Elevate legs throughout the day. Use the Shoe with the PegAssist off-loading insole Additional Orders / Instructions: Follow Nutritious Diet - Try to get 70-100 grams of Protein a day+ WOUND #2: - Calcaneus Wound Laterality: Left Cleanser: Normal Saline (Generic) 1 x Per Day/30 Days Discharge Instructions: Cleanse the wound with Normal Saline prior to applying a clean dressing using gauze sponges, not tissue or cotton balls. Cleanser: Wound Cleanser (Generic) 1 x Per Day/30 Days Discharge Instructions: Cleanse the wound with wound cleanser prior to applying a clean dressing using gauze sponges, not tissue or cotton balls. Peri-Wound Care: Zinc Oxide Ointment 30g tube 1 x Per Day/30 Days Discharge Instructions: Apply Zinc Oxide to periwound with each dressing change Topical: Gentamicin 1 x Per Day/30 Days Discharge Instructions: hold Topical: compounding topical antibiotics from [facility] pharmacy 1 x Per Day/30 Days Discharge  Instructions: HOLD Prim Dressing: Maxorb Extra Ag+ Alginate Dressing, 4x4.75 (in/in) 1 x Per Day/30 Days ary Discharge Instructions: Apply to wound bed as instructed Secondary Dressing: Drawtex 4x4 in 1 x Per Day/30 Days MATRIM, GARDY ([id number]) [id number] Page 14 of 16 Discharge Instructions: Apply over primary dressing as directed. Secondary Dressing: Optifoam Non-Adhesive Dressing, 4x4 in (Generic) 1 x Per Day/30 Days Discharge Instructions: Apply over primary dressing as directed. Secondary Dressing: Woven Gauze Sponge, Non-Sterile 4x4 in (Generic) 1 x Per Day/30 Days Discharge Instructions: Apply over primary dressing as directed. Secured With: 2M Medipore H Soft Cloth Surgical T ape, 4 x 10 (in/yd) (Generic) 1 x Per Day/30 Days Discharge Instructions: Secure with tape as directed. Compression Wrap: Kerlix Roll 4.5x3.1 (in/yd) 1 x Per Day/30 Days Discharge Instructions: Apply Kerlix and Coban compression as directed. Add-Ons: [employer] Offloading Boot, Size Regular 1 x Per Day/30 Days 12/15/2022: The wound continues to improve. There is almost no moisture-related tissue breakdown. There is epithelium beginning to fill in again from the edges. Light slough on the wound surface. I used a curette to debride the slough from his wound. We will continue topical gentamicin with silver alginate and drawtex. He will continue to wear the heel offloading shoe and use a cast protector when he showers to avoid moisture buildup on the wound. Follow-up in 1 week. Electronic Signature(s) Signed: 12/15/2022 11:34:20 AM By: [name] MD FACS Entered By: [name] on 12/15/2022 11:34:19 -------------------------------------------------------------------------------- HxROS Details Patient Name: Date of Service: [name], [name] 12/15/2022 10:30 A M Medical Record Number: [id number] Patient Account Number: [id number] Date of Birth/Sex: Treating RN: 1975-09-08  (47 y.o. M) Primary Care Provider: Dorinda Hill Other Clinician: Referring Provider: Treating Provider/Extender: Jana Hakim in Treatment: 126 Information Obtained From Patient Constitutional Symptoms (General Health) Medical History: Past Medical History Notes: COVID PNA 07/22/2019-11/14/2019 VENT ECMO, foot drop left foot , Eyes Medical History: Negative for: Cataracts; Glaucoma; Optic Neuritis Ear/Nose/Mouth/Throat Medical History: Negative for: Chronic sinus problems/congestion; Middle ear  problems Hematologic/Lymphatic Medical History: Negative for: Anemia; Hemophilia; Human Immunodeficiency Virus; Lymphedema; Sickle Cell Disease Respiratory Medical History: Positive for: Asthma Negative for: Aspiration; Chronic Obstructive Pulmonary Disease (COPD); Pneumothorax; Sleep Apnea; Tuberculosis Cardiovascular Medical History: Positive for: Angina - with COVID; Hypertension Negative for: Arrhythmia; Congestive Heart Failure; Coronary Artery Disease; Deep Vein Thrombosis; Hypotension; Myocardial Infarction; Peripheral Arterial Disease; Peripheral Venous Disease; Phlebitis; Vasculitis Gastrointestinal Medical History: Negative for: Cirrhosis ; Colitis; Crohns; Hepatitis A; Hepatitis B; Hepatitis KASTON, SPATA ([id number]) [id number] Page 15 of 16 Endocrine Medical History: Negative for: Type I Diabetes; Type II Diabetes Genitourinary Medical History: Negative for: End Stage Renal Disease Past Medical History Notes: kidney stone Immunological Medical History: Negative for: Lupus Erythematosus; Raynauds; Scleroderma Integumentary (Skin) Medical History: Negative for: History of Burn Musculoskeletal Medical History: Negative for: Gout; Rheumatoid Arthritis; Osteoarthritis; Osteomyelitis Neurologic Medical History: Negative for: Dementia; Neuropathy; Quadriplegia; Paraplegia; Seizure Disorder Oncologic Medical  History: Negative for: Received Chemotherapy; Received Radiation Psychiatric Medical History: Negative for: Anorexia/bulimia; Confinement Anxiety Past Medical History Notes: anxiety Immunizations Pneumococcal Vaccine: Received Pneumococcal Vaccination: No Implantable Devices None Hospitalization / Surgery History Type of Hospitalization/Surgery COVID PNA 07/22/2019- 11/14/2019 03/27/2020 wound debridement/ skin graft Family and Social History Cancer: Yes - Maternal Grandparents; Diabetes: Yes - Father,Paternal Grandparents; Heart Disease: Yes - Maternal Grandparents; Hereditary Spherocytosis: No; Hypertension: Yes - Father,Paternal Grandparents; Kidney Disease: No; Lung Disease: Yes - Siblings; Seizures: No; Stroke: No; Thyroid Problems: No; Tuberculosis: No; Never smoker; Marital Status - Married; Alcohol Use: Never; Drug Use: No History; Caffeine Use: Daily - tea, soda; Financial Concerns: No; Food, Clothing or Shelter Needs: No; Support System Lacking: No; Transportation Concerns: No Electronic Signature(s) Signed: 12/15/2022 12:17:36 PM By: [name] MD FACS Entered By: [name] on 12/15/2022 11:32:11 -------------------------------------------------------------------------------- SuperBill Details Patient Name: Date of Service: [name], [name] 12/15/2022 Medical Record Number: [id number] Patient Account Number: [id number] Date of Birth/Sex: Treating RN: Mar 26, 1976 (47 y.o. [name] Primary Care Provider: Dorinda Hill Other Clinician: BANKS, QU ([id number]) [id number] Page 16 of 16 Referring Provider: Treating Provider/Extender: Jana Hakim in Treatment: 126 Diagnosis Coding ICD-10 Codes Code Description [phone] Non-pressure chronic ulcer of other part of left foot with fat layer exposed Facility Procedures : CPT4 Code: [id number] Description: 97597 - DEBRIDE WOUND 1ST 20 SQ CM OR < ICD-10  Diagnosis Description L97.522 Non-pressure chronic ulcer of other part of left foot with fat layer exposed Modifier: Quantity: 1 Physician Procedures : CPT4 Code Description Modifier [id number] - WC PHYS LEVEL 4 - EST PT 25 ICD-10 Diagnosis Description L97.522 Non-pressure chronic ulcer of other part of left foot with fat layer exposed Quantity: 1 : [id number] - WC PHYS DEBR WO ANESTH 20 SQ CM ICD-10 Diagnosis Description L97.522 Non-pressure chronic ulcer of other part of left foot with fat layer exposed Quantity: 1 Electronic Signature(s) Signed: 12/15/2022 11:34:38 AM By: [name] MD FACS Entered By: [name] on 12/15/2022 11:34:37

## 2023-01-17 NOTE — Progress Notes (Signed)
STEPFON, MEYEROWITZ ([id number]) [id number] Page 1 of 7 Visit Report for 12/15/2022 Arrival Information Details Patient Name: Date of Service: [name], TO [name] 12/15/2022 10:30 A M Medical Record Number: [id number] Patient Account Number: [id number] Date of Birth/Sex: Treating RN: Sep 15, 1975 (47 y.o. M) Primary Care Walda Hertzog: Dorinda Hill Other Clinician: Referring Vanesha Athens: Treating Coline Calkin/Extender: Jana Hakim in Treatment: 126 Visit Information History Since Last Visit All ordered tests and consults were completed: No Patient Arrived: Wheel Chair Added or deleted any medications: No Arrival Time: 10:23 Any new allergies or adverse reactions: No Accompanied By: wife Had a fall or experienced change in No Transfer Assistance: None activities of daily living that may affect Patient Identification Verified: Yes risk of falls: Secondary Verification Process Completed: Yes Signs or symptoms of abuse/neglect since last visito No Patient Requires Transmission-Based Precautions: No Hospitalized since last visit: No Patient Has Alerts: No Implantable device outside of the clinic excluding No cellular tissue based products placed in the center since last visit: Pain Present Now: No Electronic Signature(s) Signed: 12/15/2022 11:54:01 AM By: Dayton Scrape Entered By: Dayton Scrape on 12/15/2022 10:24:42 -------------------------------------------------------------------------------- Encounter Discharge Information Details Patient Name: Date of Service: [name], TO [name] 12/15/2022 10:30 A M Medical Record Number: [id number] Patient Account Number: [id number] Date of Birth/Sex: Treating RN: 1975-11-16 (47 y.o. [name] Primary Care Dariya Gainer: Dorinda Hill Other Clinician: Referring Milayah Krell: Treating Gerianne Simonet/Extender: Jana Hakim in Treatment: 126 Encounter Discharge Information Items Post Procedure  Vitals Discharge Condition: Stable Temperature (F): 98.1 Ambulatory Status: Ambulatory Pulse (bpm): 78 Discharge Destination: Home Respiratory Rate (breaths/min): 18 Transportation: Private Auto Blood Pressure (mmHg): 128/64 Accompanied By: self Schedule Follow-up Appointment: Yes Clinical Summary of Care: Patient Declined Electronic Signature(s) Signed: 01/17/2023 7:49:05 AM By: Brenton Grills Entered By: Brenton Grills on 12/15/2022 11:10:55 -------------------------------------------------------------------------------- Lower Extremity Assessment Details Patient Name: Date of Service: [name], TO [name] 12/15/2022 10:30 A M Medical Record Number: [id number] Patient Account Number: [id number] Date of Birth/Sex: Treating RN: June 06, 1976 (47 y.o. [name] Primary Care Angle Dirusso: Dorinda Hill Other Clinician: Referring Rilley Stash: Treating Cobi Aldape/Extender: Jana Hakim in Treatment: 126 Edema Assessment Assessed: [name]: No] Franne Forts: No] G[LeftJeanice Lim ([id number])] [Right: [id number] Page 2 of 7] Edema: [Left: N] [Right: o] Calf Left: Right: Point of Measurement: 29 cm From Medial Instep 44.1 cm Ankle Left: Right: Point of Measurement: 9 cm From Medial Instep 24.2 cm Vascular Assessment Pulses: Dorsalis Pedis Palpable: [Left:Yes] Electronic Signature(s) Signed: 01/17/2023 7:49:05 AM By: Brenton Grills Entered By: Brenton Grills on 12/15/2022 10:29:47 -------------------------------------------------------------------------------- Multi Wound Chart Details Patient Name: Date of Service: [name], [name] 12/15/2022 10:30 A M Medical Record Number: [id number] Patient Account Number: [id number] Date of Birth/Sex: Treating RN: 14-Apr-1976 (47 y.o. M) Primary Care Ellesse Antenucci: Dorinda Hill Other Clinician: Referring Ameet Sandy: Treating Jermain Curt/Extender: Jana Hakim in Treatment: 126 Vital  Signs Height(in): 69 Pulse(bpm): 77 Weight(lbs): 280 Blood Pressure(mmHg): 113/74 Body Mass Index(BMI): 41.3 Temperature(F): 98.4 Respiratory Rate(breaths/min): 20 [2:Photos:] [N/A:No Photos N/A] Left Calcaneus Left Calcaneus N/A Wound Location: Pressure Injury Pressure Injury N/A Wounding Event: Pressure Ulcer Pressure Ulcer N/A Primary Etiology: Asthma, Angina, Hypertension Asthma, Angina, Hypertension N/A Comorbid History: 10/07/2019 10/07/2019 N/A Date Acquired: 126 126 N/A Weeks of Treatment: Open Open N/A Wound Status: No No N/A Wound Recurrence: 4.5x2.5x0.3 4.5x2x0.3 N/A Measurements L x W x D (cm) 8.836 7.069 N/A A (cm) : rea 2.651 2.121 N/A Volume (cm) : 67.40% 73.90%  N/A % Reduction in A rea: 90.20% 92.20% N/A % Reduction in Volume: Category/Stage III Category/Stage III N/A Classification: Medium Medium N/A Exudate A mount: Serosanguineous Serosanguineous N/A Exudate Type: red, brown red, brown N/A Exudate Color: Thickened Distinct, outline attached N/A Wound Margin: Medium (34-66%) Medium (34-66%) N/A Granulation A mount: Red, Pink Red, Pink N/A Granulation Quality: Medium (34-66%) Medium (34-66%) N/A Necrotic A mount: Eschar, Adherent [facility] N/A Necrotic Tissue: Fat Layer (Subcutaneous Tissue): Yes Fat Layer (Subcutaneous Tissue): Yes N/A Exposed Structures: Fascia: No Fascia: No Tendon: No Tendon: No JACQUISE, RARICK ([id number]) [id number] Page 3 of 7 Muscle: No Muscle: No Joint: No Joint: No Bone: No Bone: No Medium (34-66%) N/A N/A Epithelialization: Debridement - Selective/Open Wound Debridement - Selective/Open Wound N/A Debridement: Pre-procedure Verification/Time Out 10:50 10:50 N/A Taken: Lidocaine 4% Topical Solution Lidocaine 4% Topical Solution N/A Pain Control: [employer] N/A Tissue Debrided: Non-Viable Tissue Non-Viable Tissue N/A Level: 7.06 7.06 N/A Debridement A (sq  cm): rea Curette Curette N/A Instrument: Minimum Minimum N/A Bleeding: Pressure Pressure N/A Hemostasis A chieved: 0 0 N/A Procedural Pain: 0 0 N/A Post Procedural Pain: Procedure was tolerated well Procedure was tolerated well N/A Debridement Treatment Response: 4.5x2x0.3 4.5x2x0.3 N/A Post Debridement Measurements L x W x D (cm) 2.121 2.121 N/A Post Debridement Volume: (cm) Category/Stage III Category/Stage III N/A Post Debridement Stage: Callus: Yes Excoriation: No N/A Periwound Skin Texture: Scarring: Yes Induration: No Callus: No Crepitus: No Rash: No Scarring: No Maceration: Yes Dry/Scaly: Yes N/A Periwound Skin Moisture: Dry/Scaly: No Maceration: No No Abnormalities Noted Hemosiderin Staining: Yes N/A Periwound Skin Color: Atrophie Blanche: No Cyanosis: No Ecchymosis: No Erythema: No Mottled: No Pallor: No Rubor: No No Abnormality No Abnormality N/A Temperature: Debridement Debridement N/A Procedures Performed: Treatment Notes Wound #2 (Calcaneus) Wound Laterality: Left Cleanser Normal Saline Discharge Instruction: Cleanse the wound with Normal Saline prior to applying a clean dressing using gauze sponges, not tissue or cotton balls. Wound Cleanser Discharge Instruction: Cleanse the wound with wound cleanser prior to applying a clean dressing using gauze sponges, not tissue or cotton balls. Peri-Wound Care Zinc Oxide Ointment 30g tube Discharge Instruction: Apply Zinc Oxide to periwound with each dressing change Topical Gentamicin Discharge Instruction: hold compounding topical antibiotics from [facility] pharmacy Discharge Instruction: HOLD Primary Dressing Maxorb Extra Ag+ Alginate Dressing, 4x4.75 (in/in) Discharge Instruction: Apply to wound bed as instructed Secondary Dressing Drawtex 4x4 in Discharge Instruction: Apply over primary dressing as directed. Optifoam Non-Adhesive Dressing, 4x4 in Discharge Instruction: Apply over primary  dressing as directed. Woven Gauze Sponge, Non-Sterile 4x4 in Discharge Instruction: Apply over primary dressing as directed. Secured With 40M Medipore H Soft Cloth Surgical T ape, 4 x 10 (in/yd) Discharge Instruction: Secure with tape as directed. Compression Wrap Kerlix Roll 4.5x3.1 (in/yd) Discharge Instruction: Apply Kerlix and Coban compression as directed. NEFTALI, ABAIR ([id number]) [id number] Page 4 of 7 Compression Stockings Add-Ons [place] [facility] Offloading Boot, Size Regular Electronic Signature(s) Signed: 12/15/2022 11:31:12 AM By: [name] MD FACS Entered By: [name] on 12/15/2022 11:31:12 -------------------------------------------------------------------------------- Multi-Disciplinary Care Plan Details Patient Name: Date of Service: [name], TO [name] 12/15/2022 10:30 A M Medical Record Number: [id number] Patient Account Number: [id number] Date of Birth/Sex: Treating RN: 1976-02-07 (47 y.o. [name] Primary Care Daisy Mcneel: Dorinda Hill Other Clinician: Referring Nova Evett: Treating Shelbe Haglund/Extender: Jana Hakim in Treatment: 126 Multidisciplinary Care Plan reviewed with physician Active Inactive Wound/Skin Impairment Nursing Diagnoses: Impaired tissue integrity Knowledge deficit related to ulceration/compromised skin integrity  Goals: Patient/caregiver will verbalize understanding of skin care regimen Date Initiated: 07/15/2020 Target Resolution Date: 05/08/2023 Goal Status: Active Ulcer/skin breakdown will have a volume reduction of 30% by week 4 Date Initiated: 07/15/2020 Date Inactivated: 08/20/2020 Target Resolution Date: 09/03/2020 Goal Status: Unmet Unmet Reason: no major changes. Ulcer/skin breakdown will heal within 14 weeks Date Initiated: 12/04/2020 Date Inactivated: 12/10/2020 Target Resolution Date: 12/10/2020 Unmet Reason: wounds still open at 14 Goal Status: Unmet weeks and today 21  weeks. Interventions: Assess patient/caregiver ability to obtain necessary supplies Assess patient/caregiver ability to perform ulcer/skin care regimen upon admission and as needed Assess ulceration(s) every visit Provide education on ulcer and skin care Treatment Activities: Skin care regimen initiated : 07/15/2020 Topical wound management initiated : 07/15/2020 Notes: Electronic Signature(s) Signed: 01/17/2023 7:49:05 AM By: Brenton Grills Entered By: Brenton Grills on 12/15/2022 11:09:16 -------------------------------------------------------------------------------- Pain Assessment Details Patient Name: Date of Service: [name], TO [name] 12/15/2022 10:30 A M Medical Record Number: [id number] Patient Account Number: [id number] Date of Birth/Sex: Treating RN: 1976/06/11 (47 y.o. M) Primary Care Aalayah Riles: Dorinda Hill Other Clinician: Referring Jamil Castillo: Treating Malisa Ruggiero/Extender: Jana Hakim in Treatment: [address], [place] E ([id number]) [id number] Page 5 of 7 Active Problems Location of Pain Severity and Description of Pain Patient Has Paino No Site Locations Pain Management and Medication Current Pain Management: Electronic Signature(s) Signed: 12/15/2022 11:54:01 AM By: Dayton Scrape Entered By: Dayton Scrape on 12/15/2022 10:25:22 -------------------------------------------------------------------------------- Patient/Caregiver Education Details Patient Name: Date of Service: Mart Piggs 5/9/2024andnbsp10:30 A M Medical Record Number: [id number] Patient Account Number: [id number] Date of Birth/Gender: Treating RN: September 18, 1975 (47 y.o. [name] Primary Care Physician: Dorinda Hill Other Clinician: Referring Physician: Treating Physician/Extender: Jana Hakim in Treatment: 126 Education Assessment Education Provided To: Patient and Caregiver Education Topics Provided Wound/Skin  Impairment: Methods: Explain/Verbal Responses: State content correctly Electronic Signature(s) Signed: 01/17/2023 7:49:05 AM By: Brenton Grills Entered By: Brenton Grills on 12/15/2022 10:37:55 -------------------------------------------------------------------------------- Wound Assessment Details Patient Name: Date of Service: [name], TO [name] 12/15/2022 10:30 A M Medical Record Number: [id number] Patient Account Number: [id number] Date of Birth/Sex: Treating RN: 1976-03-12 (47 y.o. M) Primary Care Vitoria Conyer: Dorinda Hill Other Clinician: Referring Dmoni Fortson: Treating Isacc Turney/Extender: Jana Hakim in Treatment: [address] ANAS, KEYE ([id number]) [id number] Page 6 of 7 Wound Number: 2 Primary Etiology: Pressure Ulcer Wound Location: Left Calcaneus Wound Status: Open Wounding Event: Pressure Injury Comorbid History: Asthma, Angina, Hypertension Date Acquired: 10/07/2019 Weeks Of Treatment: 126 Clustered Wound: No Photos Wound Measurements Length: (cm) 4.5 Width: (cm) 2.5 Depth: (cm) 0.3 Area: (cm) 8.836 Volume: (cm) 2.651 % Reduction in Area: 67.4% % Reduction in Volume: 90.2% Epithelialization: Medium (34-66%) Wound Description Classification: Category/Stage III Wound Margin: Thickened Exudate Amount: Medium Exudate Type: Serosanguineous Exudate Color: red, brown Foul Odor After Cleansing: No Slough/Fibrino Yes Wound Bed Granulation Amount: Medium (34-66%) Exposed Structure Granulation Quality: Red, Pink Fascia Exposed: No Necrotic Amount: Medium (34-66%) Fat Layer (Subcutaneous Tissue) Exposed: Yes Necrotic Quality: Eschar, Adherent Slough Tendon Exposed: No Muscle Exposed: No Joint Exposed: No Bone Exposed: No Periwound Skin Texture Texture Color No Abnormalities Noted: No No Abnormalities Noted: Yes Callus: Yes Temperature / Pain Scarring: Yes Temperature: No Abnormality Moisture No  Abnormalities Noted: No Dry / Scaly: No Maceration: Yes Electronic Signature(s) Signed: 12/15/2022 11:54:01 AM By: Dayton Scrape Entered By: Dayton Scrape on 12/15/2022 10:30:26 -------------------------------------------------------------------------------- Wound Assessment Details Patient Name: Date of Service: GO Alyson Reedy, [name] 12/15/2022  10:30 A M Medical Record Number: [id number] Patient Account Number: [id number] Date of Birth/Sex: Treating RN: 03/18/1976 (47 y.o. M) Primary Care Kaylynne Andres: Dorinda Hill Other Clinician: Referring Kardell Virgil: Treating Dynastie Knoop/Extender: Jana Hakim in Treatment: 126 Wound Status Wound Number: 2 Primary Etiology: Pressure Ulcer SYRUS, STAUSS ([id number]) [id number] Page 7 of 7 Wound Location: Left Calcaneus Wound Status: Open Wounding Event: Pressure Injury Comorbid History: Asthma, Angina, Hypertension Date Acquired: 10/07/2019 Weeks Of Treatment: 126 Clustered Wound: No Wound Measurements Length: (cm) 4.5 Width: (cm) 2 Depth: (cm) 0.3 Area: (cm) 7.069 Volume: (cm) 2.121 % Reduction in Area: 73.9% % Reduction in Volume: 92.2% Tunneling: No Undermining: No Wound Description Classification: Category/Stage III Wound Margin: Distinct, outline attached Exudate Amount: Medium Exudate Type: Serosanguineous Exudate Color: red, brown Foul Odor After Cleansing: No Slough/Fibrino No Wound Bed Granulation Amount: Medium (34-66%) Exposed Structure Granulation Quality: Red, Pink Fascia Exposed: No Necrotic Amount: Medium (34-66%) Fat Layer (Subcutaneous Tissue) Exposed: Yes Necrotic Quality: Adherent Slough Tendon Exposed: No Muscle Exposed: No Joint Exposed: No Bone Exposed: No Periwound Skin Texture Texture Color No Abnormalities Noted: No No Abnormalities Noted: No Callus: No Atrophie Blanche: No Crepitus: No Cyanosis: No Excoriation: No Ecchymosis: No Induration: No Erythema:  No Rash: No Hemosiderin Staining: Yes Scarring: No Mottled: No Pallor: No Moisture Rubor: No No Abnormalities Noted: No Dry / Scaly: Yes Temperature / Pain Maceration: No Temperature: No Abnormality Electronic Signature(s) Signed: 01/17/2023 7:49:05 AM By: Brenton Grills Entered By: Brenton Grills on 12/15/2022 10:33:34 -------------------------------------------------------------------------------- Vitals Details Patient Name: Date of Service: [name], [name] 12/15/2022 10:30 A M Medical Record Number: [id number] Patient Account Number: [id number] Date of Birth/Sex: Treating RN: 1975/09/19 (47 y.o. M) Primary Care Brownie Nehme: Dorinda Hill Other Clinician: Referring Faizon Capozzi: Treating Daylyn Christine/Extender: Jana Hakim in Treatment: 126 Vital Signs Time Taken: 10:24 Temperature (F): 98.4 Height (in): 69 Pulse (bpm): 77 Weight (lbs): 280 Respiratory Rate (breaths/min): 20 Body Mass Index (BMI): 41.3 Blood Pressure (mmHg): 113/74 Reference Range: 80 - 120 mg / dl Electronic Signature(s) Signed: 12/15/2022 11:54:01 AM By: Dayton Scrape Entered By: Dayton Scrape on 12/15/2022 10:25:09

## 2023-01-18 ENCOUNTER — Ambulatory Visit (HOSPITAL_BASED_OUTPATIENT_CLINIC_OR_DEPARTMENT_OTHER): Payer: PPO | Admitting: General Surgery

## 2023-01-19 ENCOUNTER — Ambulatory Visit (HOSPITAL_BASED_OUTPATIENT_CLINIC_OR_DEPARTMENT_OTHER): Payer: PPO | Admitting: General Surgery

## 2023-01-20 ENCOUNTER — Encounter (HOSPITAL_BASED_OUTPATIENT_CLINIC_OR_DEPARTMENT_OTHER): Payer: PPO | Admitting: General Surgery

## 2023-01-20 DIAGNOSIS — L97522 Non-pressure chronic ulcer of other part of left foot with fat layer exposed: Secondary | ICD-10-CM | POA: Diagnosis not present

## 2023-01-20 DIAGNOSIS — L89623 Pressure ulcer of left heel, stage 3: Secondary | ICD-10-CM | POA: Diagnosis not present

## 2023-01-24 DIAGNOSIS — L97512 Non-pressure chronic ulcer of other part of right foot with fat layer exposed: Secondary | ICD-10-CM | POA: Diagnosis not present

## 2023-01-26 ENCOUNTER — Encounter (HOSPITAL_BASED_OUTPATIENT_CLINIC_OR_DEPARTMENT_OTHER): Payer: PPO | Admitting: General Surgery

## 2023-01-26 ENCOUNTER — Encounter: Payer: Self-pay | Admitting: Allergy & Immunology

## 2023-01-26 ENCOUNTER — Other Ambulatory Visit: Payer: Self-pay

## 2023-01-26 ENCOUNTER — Ambulatory Visit (INDEPENDENT_AMBULATORY_CARE_PROVIDER_SITE_OTHER): Payer: PPO | Admitting: Allergy & Immunology

## 2023-01-26 VITALS — BP 130/88 | HR 88 | Temp 98.2°F | Resp 20 | Ht 68.5 in | Wt 316.1 lb

## 2023-01-26 DIAGNOSIS — J3089 Other allergic rhinitis: Secondary | ICD-10-CM

## 2023-01-26 DIAGNOSIS — L209 Atopic dermatitis, unspecified: Secondary | ICD-10-CM

## 2023-01-26 DIAGNOSIS — L89623 Pressure ulcer of left heel, stage 3: Secondary | ICD-10-CM | POA: Diagnosis not present

## 2023-01-26 DIAGNOSIS — J302 Other seasonal allergic rhinitis: Secondary | ICD-10-CM

## 2023-01-26 DIAGNOSIS — L97522 Non-pressure chronic ulcer of other part of left foot with fat layer exposed: Secondary | ICD-10-CM | POA: Diagnosis not present

## 2023-01-26 DIAGNOSIS — J4540 Moderate persistent asthma, uncomplicated: Secondary | ICD-10-CM | POA: Diagnosis not present

## 2023-01-26 MED ORDER — TEZEPELUMAB-EKKO 210 MG/1.91ML ~~LOC~~ SOSY
210.0000 mg | PREFILLED_SYRINGE | Freq: Once | SUBCUTANEOUS | Status: AC
  Administered 2023-01-26: 210 mg via SUBCUTANEOUS

## 2023-01-26 NOTE — Progress Notes (Signed)
FOLLOW UP  Date of Service/Encounter:  01/26/23   Assessment:   Moderate persistent asthma, uncomplicated   Absolute eosinophil count of 200 (August 2023)   Perennial and seasonal allergic rhinitis (grass, weeds, ragweed, trees, dust mites, cat, and dog)   Prolonged hospitalization for COVID19 requiring ECMO in late 2020   Fully vaccinated against COVID19  Plan/Recommendations:   1. Moderate persistent asthma, uncomplicated - Lung testing was stable compared to last time.  - Tezspire sample given today. - You can get your other injections at home every 28 days.  - Daily controller medication(s):  Breztri two puffs twice daily with spacer - Prior to physical activity: albuterol 2 puffs 10-15 minutes before physical activity. - Rescue medications: albuterol 4 puffs every 4-6 hours as needed - Asthma control goals:  * Full participation in all desired activities (may need albuterol before activity) * Albuterol use two time or less a week on average (not counting use with activity) * Cough interfering with sleep two time or less a month * Oral steroids no more than once a year * No hospitalizations  2. Allergic rhinitis - well controlled (grass, weeds, ragweed, trees, dust mites, cat, and dog) - Continue with Nasacort one spray per nostril daily. - Continue with Atrovent nasal spray one spray per nostril every 8 hours as needed for runny nose.  - Continue with cetirizine 10mg  daily.  3. Dermatitis on neck/scalp - Start fluocinolone oil twice daily as needed. - You can use this over the rest of the body as well.   4. Return in about 3 months (around 04/28/2023).   Subjective:   [name] is a 47 y.o. male presenting today for follow up of  Chief Complaint  Patient presents with   Immunotherapy    Discuss allergy shots    [name] has a history of the following: Patient Active Problem List   Diagnosis Date Noted   Type 2 diabetes mellitus without  complication, without long-term current use of insulin (HCC) 10/18/2022   Vitamin D deficiency 10/18/2022   Other fatigue 09/07/2022   SOBOE (shortness of breath on exertion) 09/07/2022   Nocturnal hypoxemia 09/07/2022   Mood disorder (HCC) 09/07/2022   Ulcer of left foot (HCC) 09/07/2022   Depression 08/23/2022   Prediabetes 08/23/2022   Obstructive sleep apnea 01/11/2022   Insomnia due to other mental disorder 12/06/2021   Left foot drop 09/06/2021   Wheelchair dependence 06/04/2021   Encounter for therapeutic drug monitoring 09/29/2020   COVID-19 long hauler manifesting chronic dyspnea 09/15/2020   Morbid obesity (HCC) with starting BMI 46 09/15/2020   Encounter for attention to tracheostomy (HCC) 09/15/2020   Critical illness polyneuropathy (HCC) 09/15/2020   Moderate protein-calorie malnutrition (HCC) 09/15/2020   Pyogenic inflammation of bone (HCC) 09/09/2020   Long COVID 09/09/2020   Acute osteomyelitis (HCC) 08/31/2020   MSSA (methicillin susceptible Staphylococcus aureus) infection 08/31/2020   Reactive depression 07/01/2020   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   Impaired sensation to light touch 04/15/2020   Diffuse alveolar damage (HCC) 03/09/2020   Eschar of foot    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   History of COVID-19    Pressure injury of skin 08/22/2019   Need for emotional support    Chest tube in place    Personal history of ECMO    Advanced care planning/counseling discussion    Palliative care by specialist    Goals of care, counseling/discussion    Advanced  directives, counseling/discussion    Subcutaneous crepitus    Thrombocytopenia (HCC)    Leukopenia    Fever    Gram positive bacterial infection    Septic arthritis of right acromioclavicular joint (HCC) 09/29/2017   Chronic left-sided low back pain with left-sided sciatica 09/19/2017   Epigastric pain    Primary hypertension 04/17/2017   Moderate  persistent asthma 07/21/2015   Allergic rhinoconjunctivitis 07/21/2015   GERD (gastroesophageal reflux disease) 07/21/2015   Abnormal gait 11/12/2009   TARSAL TUNNEL SYNDROME, LEFT 10/08/2009   PES PLANUS 10/08/2009    History obtained from: chart review and patient and his wife.  [name] is a 47 y.o. male presenting for a follow up visit.  He was last seen in March 2024.  At that time, his lung testing was stable.  We decided to start him on Tezspire and continue him on Breztri 2 puffs twice daily.  For his rhinitis, we continue with Nasacort as well as cetirizine.  We also restarted Atrovent 1 spray per nostril every 8 hours as needed.  Since last visit, he has done fairly well. He is in a wheelchair today and he is still in a boot on the left side due to a pressure sore. Wife does show me some pictures and it is ever so slowly healing away. He is on disability which was approved in the middle of 2023.   Asthma/Respiratory Symptom History: He remains on the Breztri two puffs twice daily. He tells me that he got notification that he was approved for the injection, by which I think he means the Tezspire. He has not received any of the drug in the mail and he does not recall getting a call from [place] about it. He is open to getting it today if possible. He has not been using his rescue inhaler all that much and he has not been on prednisone at all since the last visit. He is largely doing well, but he does think that he has had more SOB episodes and decrease in stamina since the last visit.   Allergic Rhinitis Symptom History: Allergic rhinitis is controlled with the use of Nasacort as well as the Atrovent. He does feel that the addition of the Atrovent has been particularly helpful for his symptoms. He also remains on the cetirizine 10mg  daily. He has not been on antibiotics at all for his symptoms.  Skin Symptom History: He does have some breakouts on the back of his neck extending into his scalp. He  has tried using some hydrocortisone for this without much improvement. This has been going on for a period of  one week or so.   Otherwise, there have been no changes to his past medical history, surgical history, family history, or social history.    Review of Systems  Constitutional: Negative.  Negative for chills, fever, malaise/fatigue and weight loss.  HENT:  Negative for congestion, ear discharge, ear pain, sinus pain and sore throat.   Eyes:  Negative for pain, discharge and redness.  Respiratory:  Negative for cough, sputum production, shortness of breath, wheezing and stridor.   Cardiovascular: Negative.  Negative for chest pain and palpitations.  Gastrointestinal:  Negative for abdominal pain, constipation, diarrhea, heartburn, nausea and vomiting.  Skin:  Positive for itching and rash.  Neurological:  Negative for dizziness and headaches.  Endo/Heme/Allergies:  Positive for environmental allergies. Does not bruise/bleed easily.       Objective:   Blood pressure 130/88, pulse 88, temperature 98.2 F (36.8  C), resp. rate 20, height 5' 8.5" (1.74 m), weight (!) 316 lb 1.6 oz (143.4 kg), SpO2 94 %. Body mass index is 47.36 kg/m.    Physical Exam Vitals reviewed.  Constitutional:      Appearance: He is well-developed.     Comments: Not in a wheelchair today.  Comfortable appearing.  HENT:     Head: Normocephalic and atraumatic.     Right Ear: Tympanic membrane, ear canal and external ear normal.     Left Ear: External ear normal. There is impacted cerumen.     Nose: No nasal deformity, septal deviation, mucosal edema or rhinorrhea.     Right Turbinates: Enlarged, swollen and pale.     Left Turbinates: Enlarged, swollen and pale.     Right Sinus: No maxillary sinus tenderness or frontal sinus tenderness.     Left Sinus: No maxillary sinus tenderness or frontal sinus tenderness.     Comments: No nasal polyps.    Mouth/Throat:     Lips: Pink.     Mouth: Mucous  membranes are moist. Mucous membranes are not pale and not dry.     Pharynx: Uvula midline.  Eyes:     General: Lids are normal. Allergic shiner present.        Right eye: No discharge.        Left eye: No discharge.     Conjunctiva/sclera: Conjunctivae normal.     Right eye: Right conjunctiva is not injected. No chemosis.    Left eye: Left conjunctiva is not injected. No chemosis.    Pupils: Pupils are equal, round, and reactive to light.  Cardiovascular:     Rate and Rhythm: Normal rate and regular rhythm.     Heart sounds: Normal heart sounds.  Pulmonary:     Effort: Pulmonary effort is normal. No tachypnea, accessory muscle usage or respiratory distress.     Breath sounds: Normal breath sounds. No wheezing, rhonchi or rales.     Comments: Moving air well in the upper fields, but not in the lower fields. Chest:     Chest wall: No tenderness.  Lymphadenopathy:     Cervical: No cervical adenopathy.  Skin:    General: Skin is warm.     Capillary Refill: Capillary refill takes less than 2 seconds.     Coloration: Skin is not pale.     Findings: Wound present. No abrasion, erythema, petechiae or rash. Rash is not papular, urticarial or vesicular.     Comments: He has some redness over the back of the neck as extending into the  scalp.   Neurological:     Mental Status: He is alert.  Psychiatric:        Behavior: Behavior is cooperative.      Diagnostic studies:    Spirometry: results normal (FEV1: 2.42/65%, FVC: 2.95/63%, FEV1/FVC: 82%).    Spirometry consistent with possible restrictive disease. This is stable compared to previous spirometric findings.   Allergy Studies: none   He did receive a sample of Tezspire and tolerated this without a problem.         Malachi Bonds, MD  [facility]

## 2023-01-26 NOTE — Patient Instructions (Addendum)
E 1. Moderate persistent asthma, uncomplicated - Lung testing was stable compared to last time.  - Tezspire sample given today. - You can get your other injections at home every 28 days.  - Daily controller medication(s):  Breztri two puffs twice daily with spacer - Prior to physical activity: albuterol 2 puffs 10-15 minutes before physical activity. - Rescue medications: albuterol 4 puffs every 4-6 hours as needed - Asthma control goals:  * Full participation in all desired activities (may need albuterol before activity) * Albuterol use two time or less a week on average (not counting use with activity) * Cough interfering with sleep two time or less a month * Oral steroids no more than once a year * No hospitalizations  2. Allergic rhinitis - well controlled (grass, weeds, ragweed, trees, dust mites, cat, and dog) - Continue with Nasacort one spray per nostril daily. - Continue with Atrovent nasal spray one spray per nostril every 8 hours as needed for runny nose.  - Continue with cetirizine 10mg  daily.  3. Dermatitis on neck/scalp - Start fluocinolone oil twice daily as needed. - You can use this over the rest of the body as well.   4. Return in about 3 months (around 04/28/2023).    Please inform us of any Emergency Department visits, hospitalizations, or changes in symptoms. Call us before going to the ED for breathing or allergy symptoms since we might be able to fit you in for a sick visit. Feel free to contact us anytime with any questions, problems, or concerns.  It was a pleasure to see you and your family again today!   Websites that have reliable patient information: 1. American Academy of Asthma, Allergy, and Immunology: www.aaaai.org 2. Food Allergy Research and Education (FARE): foodallergy.org 3. Mothers of Asthmatics: http://www.asthmacommunitynetwork.org 4. American College of Allergy, Asthma, and Immunology: www.acaai.org   COVID-19 Vaccine Information can be  found at: [email or web address].[email or web address] For questions related to vaccine distribution or appointments, please email [email or web address] or call [phone].   We realize that you might be concerned about having an allergic reaction to the COVID19 vaccines. To help with that concern, WE ARE OFFERING THE COVID19 VACCINES IN OUR OFFICE! Ask the front desk for dates!     "Like" [place] on Facebook and Instagram for our latest updates!      A healthy democracy works best when [employer] participate! Make sure you are registered to vote! If you have moved or changed any of your contact information, you will need to get this updated before voting!  In some cases, you MAY be able to register to vote online: [email or web address]

## 2023-01-26 NOTE — Progress Notes (Signed)
Immunotherapy   Patient Details  Name: [name] MRN: [id number] Date of Birth: 19-May-1976  01/26/2023  [name] started injections for  Tezspire injection Frequency:EVERY 4 WEEKS   Patient was given a Tezspire injection today at the [facility] office  Consent signed and patient instructions given. Patient waited 30 minutes in office with no issues. He plans to start at home shots but understands at his next appointment he must demonstrate that he know how to give the shots before being able to do them at home.    [name] 01/26/2023, 3:45 PM

## 2023-01-27 ENCOUNTER — Encounter: Payer: Self-pay | Admitting: Allergy & Immunology

## 2023-01-27 ENCOUNTER — Encounter: Payer: PPO | Attending: Physical Medicine and Rehabilitation | Admitting: Physical Medicine and Rehabilitation

## 2023-01-27 ENCOUNTER — Encounter: Payer: Self-pay | Admitting: Physical Medicine and Rehabilitation

## 2023-01-27 VITALS — BP 148/90 | HR 88 | Ht 68.5 in | Wt 314.0 lb

## 2023-01-27 DIAGNOSIS — Z993 Dependence on wheelchair: Secondary | ICD-10-CM | POA: Diagnosis not present

## 2023-01-27 DIAGNOSIS — G7281 Critical illness myopathy: Secondary | ICD-10-CM | POA: Diagnosis not present

## 2023-01-27 DIAGNOSIS — G6281 Critical illness polyneuropathy: Secondary | ICD-10-CM | POA: Diagnosis not present

## 2023-01-27 DIAGNOSIS — M21372 Foot drop, left foot: Secondary | ICD-10-CM | POA: Diagnosis not present

## 2023-01-27 DIAGNOSIS — R269 Unspecified abnormalities of gait and mobility: Secondary | ICD-10-CM | POA: Diagnosis not present

## 2023-01-27 MED ORDER — FLUOCINOLONE ACETONIDE 0.01 % EX SOLN: Freq: Two times a day (BID) | CUTANEOUS | 2 refills | Status: DC | PRN

## 2023-01-27 NOTE — Progress Notes (Signed)
Subjective:    Patient ID: [name], male    DOB: September 29, 1975, 47 y.o.   MRN: [id number]  HPI  Pt is a 47 yr old male with COVID ICU myopathy, Long COVID,  s/p surgery on feet due to necrosis from long ICU stay/pressors to save life-s/p  skin grafts and R foot osteomyelitis as well. Occurred 12/20.  Also has L foot drop. Still w/c dependent- still cannot put weight on feet per Plastics.  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  Hurting "all over". Hurting more than usual- has had it in past, but getting worse.    Just started allergy shots yesterday-    Still taking Lamictal 100 mg daily-   Not able to bear weight on L foot anymore-  Wife took picture yesterday-  Set backs macerated/wet and has set him back- feet sweat real bad.    Has started steps to get gastric bypass- weighed at Allergist- was 314/315 lbs- a lot of steps to go through surgery- has gone through many of steps- has appt next week for surgeon.   Thinking going back to counseling- wife and pt hasn't gone back yet, but having difficulties.     Sleep- last few weeks, been terrible- esp last few nights except last night- itching around wound-  Eating habits still crap-  Making excuses per pt- about why not eating right.   Responsible for cooking- and wife doing all the working-   Even got wet this AM- got leak in in shower boot-  Social Hx:  Son graduated high school last week! Job coach - next fall going to enroll into community college- to try and live- [employer], etc.  And maybe learn trade.    Pain Inventory Average Pain 6 Pain Right Now 5 My pain is constant, sharp, burning, tingling, and aching  In the last 24 hours, has pain interfered with the following? General activity 4 Relation with others 4 Enjoyment of life 7 What TIME of day is your pain at its worst? night Sleep (in general) Poor  Pain is worse with: walking, bending,  inactivity, standing, and some activites Pain improves with: rest, heat/ice, and medication Relief from Meds: 5  Family History  Problem Relation Age of Onset   Migraines Mother    GER disease Mother    Heart disease Father    Hyperlipidemia Father    Diabetes Father    Hypertension Father    Sleep apnea Father    Obesity Father    Asthma Brother    Pancreatic cancer Maternal Grandmother    Heart disease Maternal Grandfather    Diabetes Paternal Grandmother    Hypertension Paternal Grandmother    Social History   Socioeconomic History   Marital status: Married    Spouse name: Not on file   Number of children: 1   Years of education: Not on file   Highest education level: Not on file  Occupational History   Occupation: delivery driver   Occupation: Stay at home spouse  Tobacco Use   Smoking status: Never   Smokeless tobacco: Never  Vaping Use   Vaping Use: Never used  Substance and Sexual Activity   Alcohol use: Yes    Comment: rarely 2-3 times a year   Drug use: No   Sexual activity: Yes    Partners: Female  Other Topics Concern   Not on file  Social History Narrative  Not on file   Social Determinants of Health   Financial Resource Strain: Not on file  Food Insecurity: Not on file  Transportation Needs: Not on file  Physical Activity: Not on file  Stress: Not on file  Social Connections: Not on file   Past Surgical History:  Procedure Laterality Date   CANNULATION FOR ECMO (EXTRACORPOREAL MEMBRANE OXYGENATION) N/A 08/28/2019   Procedure: CANNULATION FOR VV ECMO (EXTRACORPOREAL MEMBRANE OXYGENATION);  Surgeon: Donata Clay, Theron Arista, MD;  Location: [facility] OR;  Service: Open Heart Surgery;  Laterality: N/A;  CRESCENT CANNULA   CANNULATION FOR ECMO (EXTRACORPOREAL MEMBRANE OXYGENATION) N/A 09/10/2019   Procedure: CANNULATION FOR ECMO (EXTRACORPOREAL MEMBRANE OXYGENATION) PUTTING IN CRESCENT 32FR CANNULA  AND REMOVING GROING CANNULATION;  Surgeon: Linden Dolin, MD;   Location: [facility] OR;  Service: Open Heart Surgery;  Laterality: N/A;  PUTTING IN CRESCENT/REMOVING GROIN CANNULATION   CYSTOSCOPY/URETEROSCOPY/HOLMIUM LASER/STENT PLACEMENT Left 04/18/2019   Procedure: LEFT URETEROSCOPY/HOLMIUM LASER/STENT PLACEMENT;  Surgeon: Crist Fat, MD;  Location: [facility];  Service: Urology;  Laterality: Left;   CYSTOSCOPY/URETEROSCOPY/HOLMIUM LASER/STENT PLACEMENT Left 05/02/2019   Procedure: CYSTOSCOPY/URETEROSCOPY/HOLMIUM LASER/STENT EXCHANGE;  Surgeon: Crist Fat, MD;  Location: [facility] ORS;  Service: Urology;  Laterality: Left;   ECMO CANNULATION N/A 08/03/2019   Procedure: ECMO CANNULATION;  Surgeon: Linden Dolin, MD;  Location: [facility];  Service: Cardiovascular;  Laterality: N/A;   ESOPHAGOGASTRODUODENOSCOPY N/A 09/11/2019   Procedure: ESOPHAGOGASTRODUODENOSCOPY (EGD);  Surgeon: Linden Dolin, MD;  Location: [facility] OR;  Service: Thoracic;  Laterality: N/A;   GRAFT APPLICATION Bilateral 03/27/2020   Procedure: APPLICATION OF SKIN GRAFT BILATERAL FEET;  Surgeon: Felecia Shelling, DPM;  Location: [facility];  Service: Podiatry;  Laterality: Bilateral;   IR REPLC GASTRO/COLONIC TUBE PERCUT W/FLUORO  10/14/2019   IRRIGATION AND DEBRIDEMENT SHOULDER Right 09/29/2017   Procedure: IRRIGATION AND DEBRIDEMENT SHOULDER;  Surgeon: Tarry Kos, MD;  Location: [facility] OR;  Service: Orthopedics;  Laterality: Right;   LUMBAR DISC SURGERY  2002   NASAL ENDOSCOPY WITH EPISTAXIS CONTROL N/A 08/31/2019   Procedure: NASAL ENDOSCOPY WITH EPISTAXIS CONTROL WITH CAUTERIZATION;  Surgeon: Christia Reading, MD;  Location: [facility] OR;  Service: ENT;  Laterality: N/A;   PORTACATH PLACEMENT N/A 09/11/2019   Procedure: PEG TUBE INSERTION - BEDSIDE;  Surgeon: Linden Dolin, MD;  Location: [facility] OR;  Service: Thoracic;  Laterality: N/A;   TEE WITHOUT CARDIOVERSION N/A 08/28/2019   Procedure: TRANSESOPHAGEAL ECHOCARDIOGRAM (TEE);  Surgeon: Donata Clay, Theron Arista, MD;  Location: [facility] OR;   Service: Open Heart Surgery;  Laterality: N/A;   TEE WITHOUT CARDIOVERSION N/A 09/10/2019   Procedure: TRANSESOPHAGEAL ECHOCARDIOGRAM (TEE);  Surgeon: Linden Dolin, MD;  Location: [facility] OR;  Service: Open Heart Surgery;  Laterality: N/A;   WOUND DEBRIDEMENT Bilateral 03/27/2020   Procedure: EXCISIONAL DEBRIDEMENT OF ULCERS BILATERAL FEET;  Surgeon: Felecia Shelling, DPM;  Location: [facility];  Service: Podiatry;  Laterality: Bilateral;   Past Surgical History:  Procedure Laterality Date   CANNULATION FOR ECMO (EXTRACORPOREAL MEMBRANE OXYGENATION) N/A 08/28/2019   Procedure: CANNULATION FOR VV ECMO (EXTRACORPOREAL MEMBRANE OXYGENATION);  Surgeon: Donata Clay, Theron Arista, MD;  Location: [facility] OR;  Service: Open Heart Surgery;  Laterality: N/A;  CRESCENT CANNULA   CANNULATION FOR ECMO (EXTRACORPOREAL MEMBRANE OXYGENATION) N/A 09/10/2019   Procedure: CANNULATION FOR ECMO (EXTRACORPOREAL MEMBRANE OXYGENATION) PUTTING IN CRESCENT 32FR CANNULA  AND REMOVING GROING CANNULATION;  Surgeon: Linden Dolin, MD;  Location: [facility] OR;  Service: Open Heart Surgery;  Laterality: N/A;  PUTTING IN CRESCENT/REMOVING GROIN CANNULATION   CYSTOSCOPY/URETEROSCOPY/HOLMIUM LASER/STENT PLACEMENT Left 04/18/2019   Procedure: LEFT URETEROSCOPY/HOLMIUM LASER/STENT PLACEMENT;  Surgeon: Crist Fat, MD;  Location: [facility];  Service: Urology;  Laterality: Left;   CYSTOSCOPY/URETEROSCOPY/HOLMIUM LASER/STENT PLACEMENT Left 05/02/2019   Procedure: CYSTOSCOPY/URETEROSCOPY/HOLMIUM LASER/STENT EXCHANGE;  Surgeon: Crist Fat, MD;  Location: [facility];  Service: Urology;  Laterality: Left;   ECMO CANNULATION N/A 08/03/2019   Procedure: ECMO CANNULATION;  Surgeon: Linden Dolin, MD;  [facility];  Service: Cardiovascular;  Laterality: N/A;   ESOPHAGOGASTRODUODENOSCOPY N/A 09/11/2019   Procedure: ESOPHAGOGASTRODUODENOSCOPY (EGD);  Surgeon: Linden Dolin, MD;  Location: [facility] OR;  Service: Thoracic;   Laterality: N/A;   GRAFT APPLICATION Bilateral 03/27/2020   Procedure: APPLICATION OF SKIN GRAFT BILATERAL FEET;  Surgeon: Felecia Shelling, DPM;  Location: [facility];  Service: Podiatry;  Laterality: Bilateral;   IR REPLC GASTRO/COLONIC TUBE PERCUT W/FLUORO  10/14/2019   IRRIGATION AND DEBRIDEMENT SHOULDER Right 09/29/2017   Procedure: IRRIGATION AND DEBRIDEMENT SHOULDER;  Surgeon: Tarry Kos, MD;  Location: [facility] OR;  Service: Orthopedics;  Laterality: Right;   LUMBAR DISC SURGERY  2002   NASAL ENDOSCOPY WITH EPISTAXIS CONTROL N/A 08/31/2019   Procedure: NASAL ENDOSCOPY WITH EPISTAXIS CONTROL WITH CAUTERIZATION;  Surgeon: Christia Reading, MD;  Location: [facility] OR;  Service: ENT;  Laterality: N/A;   PORTACATH PLACEMENT N/A 09/11/2019   Procedure: PEG TUBE INSERTION - BEDSIDE;  Surgeon: Linden Dolin, MD;  Location: [facility] OR;  Service: Thoracic;  Laterality: N/A;   TEE WITHOUT CARDIOVERSION N/A 08/28/2019   Procedure: TRANSESOPHAGEAL ECHOCARDIOGRAM (TEE);  Surgeon: Donata Clay, Theron Arista, MD;  Location: [facility] OR;  Service: Open Heart Surgery;  Laterality: N/A;   TEE WITHOUT CARDIOVERSION N/A 09/10/2019   Procedure: TRANSESOPHAGEAL ECHOCARDIOGRAM (TEE);  Surgeon: Linden Dolin, MD;  Location: [facility] OR;  Service: Open Heart Surgery;  Laterality: N/A;   WOUND DEBRIDEMENT Bilateral 03/27/2020   Procedure: EXCISIONAL DEBRIDEMENT OF ULCERS BILATERAL FEET;  Surgeon: Felecia Shelling, DPM;  Location: [facility];  Service: Podiatry;  Laterality: Bilateral;   Past Medical History:  Diagnosis Date   Anginal pain (HCC)    with covid   Anxiety    Asthma    Depression    Dyspnea    GERD (gastroesophageal reflux disease)    Headache    History of kidney stones    LEFT URETERAL STONE   HTN (hypertension)    Pancreatitis 2018   GALLBALDDER SLUDGE CAUSED ISSUED RESOLVED   Pneumonia 07/2019   covid   Prediabetes    SOBOE (shortness of breath on exertion)    BP (!) 148/90 Comment: NO BP MED TAKEN TODAY  Pulse 88   Ht 5' 8.5" (1.74  m)   Wt (!) 314 lb (142.4 kg)   SpO2 93%   BMI 47.05 kg/m   Opioid Risk Score:   Fall Risk Score:  `1  Depression screen [facility] 2/9     01/27/2023   10:41 AM 09/07/2022   10:07 AM 07/25/2022    9:46 AM 04/01/2022    9:53 AM 10/25/2021    1:45 PM 07/21/2021    3:12 PM 06/04/2021    1:53 PM  Depression screen PHQ 2/9  Decreased Interest 0 3 1 1   1   Down, Depressed, Hopeless 0 3 1 1   1   PHQ - 2 Score 0 6 2 2   2   Altered sleeping  3  Tired, decreased energy  3       Change in appetite  2       Feeling bad or failure about yourself   1       Trouble concentrating  2       Moving slowly or fidgety/restless  1       Suicidal thoughts  0       PHQ-9 Score  18       Difficult doing work/chores  Very difficult          Information is confidential and restricted. Go to Review Flowsheets to unlock data.    Review of Systems  Musculoskeletal:  Positive for arthralgias and gait problem.       PAIN IN ALL MAJOR JOINT FROM BOTH SHOULDERS DOWN TO ANKLES.   All other systems reviewed and are negative.     Objective:   Physical Exam  Awake, alert, appropriate, depressed- very depressed affect, NAD a Accompanied by son wearing headphones Some wounds on anterior calves- spots slightly red LLE in walking shoe and dressed with Kerlex MS LE's 5/5 in HF/KE and LF RLE_ DF 4+/5 and PF 5/5 LLE- DF on L is 2-/5 and PF 4+/5        Assessment & Plan:   Pt is a 47 yr old male with COVID ICU myopathy, Long COVID,  s/p surgery on feet due to necrosis from long ICU stay/pressors to save life-s/p  skin grafts and R foot osteomyelitis as well. Occurred 12/20.  Also has L foot drop. Still w/c dependent- still cannot put weight on feet per Plastics.  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   2. Cover wound and spray antiperspirant on the foot to help with sweating. Can always use stick if spray gets near wound. Don't get in wound.   3.   Need to change emotional eating- because having surgery won't fix your eating habits.   4.  Can take up to Benadryl 75 mg nightly for itching. That's 3 pills-   Sounds like switched zinc oxide- which was using to dry it up- too much.   5.  We discussed how to not cook, but keep diet more balanced- eat something good for you- FIRST and then only choose good food to eat first, then less room for junk food.   6.  We discussed that I really think surgery/skin graft- is the best choice, so has an end in sight- doesn't have end in sight right now. But per pt, Plastics/Podiatry  told him no skin graft? I think a second opinion for graft is worthwhile.   7. We discussed ways to work on weight- esp eating food good for him first.   8. F/U in 3 months- double appt- ICU myopathy.    I spent a total of 34   minutes on total care today- >50% coordination of care- due to discussion about weight, maceration of L foot wound- and depression-

## 2023-01-27 NOTE — Progress Notes (Signed)
BURNARD, [name] ([id number]) [phone].pdf Page 1 of 17 Visit Report for 01/26/2023 Chief Complaint Document Details Patient Name: Date of Service: [name], TO [name] 01/26/2023 11:00 A M Medical Record Number: [id number] Patient Account Number: [id number] Date of Birth/Sex: Treating RN: March 03, 1976 (47 y.o. M) Primary Care Provider: Dorinda Hill Other Clinician: Referring Provider: Treating Provider/Extender: Jana Hakim in Treatment: 132 Information Obtained from: Patient Chief Complaint Bilateral Plantar Foot Ulcers Electronic Signature(s) Signed: 01/26/2023 11:57:07 AM By: [name] MD FACS Entered By: [name] on 01/26/2023 11:57:07 -------------------------------------------------------------------------------- Debridement Details Patient Name: Date of Service: [name], [name] 01/26/2023 11:00 A M Medical Record Number: [id number] Patient Account Number: [id number] Date of Birth/Sex: Treating RN: 09/14/1975 (47 y.o. [name] Primary Care Provider: Dorinda Hill Other Clinician: Referring Provider: Treating Provider/Extender: Jana Hakim in Treatment: 132 Debridement Performed for Assessment: Wound #2 Left Calcaneus Performed By: Physician [name], MD Debridement Type: Debridement Level of Consciousness (Pre-procedure): Awake and Alert Pre-procedure Verification/Time Out Yes - 11:40 Taken: Start Time: 11:41 Pain Control: Lidocaine 5% topical ointment Percent of Wound Bed Debrided: 100% T Area Debrided (cm): otal 2.67 Tissue and other material debrided: Viable, Non-Viable, Slough, Slough Level: Non-Viable Tissue Debridement Description: Selective/Open Wound Instrument: Curette Bleeding: Minimum Hemostasis Achieved: Pressure End Time: 11:43 Procedural Pain: 0 Post Procedural Pain: 0 Response to Treatment: Procedure was tolerated well Level of Consciousness (Post- Awake  and Alert procedure): Post Debridement Measurements of Total Wound Length: (cm) 3.4 Stage: Category/Stage III Width: (cm) 1 Depth: (cm) 0.3 Volume: (cm) 0.801 Character of Wound/Ulcer Post DebridementEVANGELOS, [name] ([id number]) [phone].pdf Page 2 of 17 Post Procedure Diagnosis Same as Pre-procedure Notes scribed for Dr. Lady Gary by Brenton Grills, RN Electronic Signature(s) Signed: 01/26/2023 12:24:43 PM By: [name] MD FACS Signed: 01/27/2023 10:19:19 AM By: Brenton Grills Entered By: Brenton Grills on 01/26/2023 11:46:47 -------------------------------------------------------------------------------- HPI Details Patient Name: Date of Service: [name], [name] 01/26/2023 11:00 A M Medical Record Number: [id number] Patient Account Number: [id number] Date of Birth/Sex: Treating RN: October 18, 1975 (47 y.o. M) Primary Care Provider: Dorinda Hill Other Clinician: Referring Provider: Treating Provider/Extender: Jana Hakim in Treatment: 132 History of Present Illness HPI Description: Wounds are12/03/2020 upon evaluation today patient presents for initial inspection here in our clinic concerning issues he has been having with the bottoms of his feet bilaterally. He states these actually occurred as wounds when he was hospitalized for 5 months secondary to Covid. He was apparently with tilting bed where he was in an upright position quite frequently and apparently this occurred in some way shape or form during that time. Fortunately there is no sign of active infection at this time. No fevers, chills, nausea, vomiting, or diarrhea. With that being said he still has substantial wounds on the plantar aspects of his feet Theragen require quite a bit of work to get these to heal. He has been using Santyl currently though that is been problematic both in receiving the medication as well as actually paid for it as it is become quite  expensive. Prior to the experience with Covid the patient really did not have any major medical problems other than hypertension he does have some mild generalized weakness following the Covid experience. 07/22/2020 on evaluation today patient appears to be doing okay in regard to his foot ulcers I feel like the wound beds are showing signs of better improvement that I do believe the Iodoflex is helping  in this regard. With that being said he does have a lot of drainage currently and this is somewhat blue/green in nature which is consistent with Pseudomonas. I do think a culture today would be appropriate for [place] to evaluate and see if that is indeed the case I would likely start him on antibiotic orally as well he is not allergic to Cipro knows of no issues he has had in the past 12/21; patient was admitted to the clinic earlier this month with bilateral presumed pressure ulcers on the bottom of his feet apparently related to excessive pressure from a tilt table arrangement in the intensive care unit. Patient relates this to being on ECMO but I am not really sure that is exactly related to that. I must say I have never seen anything like this. He has fairly extensive full-thickness wounds extending from his heel towards his midfoot mostly centered laterally. There is already been some healing distally. He does not appear to have an arterial issue. He has been using gentamicin to the wound surfaces with Iodoflex to help with ongoing debridement 1/6; this is a patient with pressure ulcers on the bottom of his feet related to excessive pressure from a standing position in the intensive care unit. He is complaining of a lot of pain in the right heel. He is not a diabetic. 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 We have been using Iodoflex to help prepare the surfaces of both wounds for an advanced treatment product. He is nonambulatory spending most of his time in a wheelchair  I have asked him not to propel the wheelchair with his heels 1/13; in general his wounds look better not much surface area change we have been using Iodoflex as of last week. I did an x-ray of the right heel as the patient was complaining of pain. I had some thoughts about a stress fracture perhaps Achilles tendon problems however what it showed was erosive changes along the inferior aspect of the calcaneus he now has a MRI booked for 1/20. 1/20; in general his wounds continue to be better. Some improvement in the large narrow areas proximally in his foot. He is still complaining of pain in the right heel and tenderness in certain areas of this wound. His MRI is tonight. I am not just looking for osteomyelitis that was brought up on the x-ray I am wondering about stress fractures, tendon ruptures etc. He has no such findings on the left. Also noteworthy is that the patient had critical illness neuropathy and some of the discomfort may be actual improvement in nerve function I am just not sure. These wounds were initially in the setting of severe critical illness related to COVID-19. He was put in a standing position. He may have also been on pressors at the point contributing to tissue ischemia. By his description at some point these wounds were grossly necrotic extending proximally up into the Achilles part of his heel. I do not know that I have ever really seen pictures of them like this although they may exist in epic We have ordered Tri layer Oasis. I am trying to stimulate some granulation in these areas. This is of course assuming the MRI is negative for infection 1/27; since the patient was last here he saw Dr. Earlene Plater of infectious disease. He is planned for vancomycin and ceftriaxone. Prior operative culture grew MSSA. Also ordered baseline lab work. He also ordered arterial studies although the ABIs in our clinic were  normal as well as his clinical exam these were normal I do not think he  needs to see vascular surgery. His ABIs at the [facility] were 1.22 in the right triphasic waveforms with a normal TBI of 1.15 on the left ABI of 1.22 with triphasic waveforms and a normal TBI of 1.08. Finally he saw Dr. Logan Bores who will follow him in for 2 months. At this point I do not think he felt that he needed a procedure on the right calcaneal bone. Dr. Earlene Plater is elected for broad-spectrum antibiotic The patient is still having pain in the right heel. He walks with a walker 2/3; wounds are generally smaller. He is tolerating his IV antibiotics. I believe this is vancomycin and ceftriaxone. We are still waiting for Oasis burn in terms of his out-of-pocket max which he should be meeting soon given the IV antibiotics, MRIs etc. I have asked him to check in on this. We are using silver collagen in the meantime the wounds look better [name], [name] ([id number]) [phone].pdf Page 3 of 17 2/10; tolerating IV vancomycin and Rocephin. We are waiting to apply for Oasis. Although I am not really sure where he is in his out-of-pocket max. 2/17 started the first application of Oasis trilayer. Still on antibiotics. The wounds have generally look better. The area on the left has a little more surface slough requiring debridement 2/24; second application of Oasis trilayer. The wound surface granulation is generally look better. The area on the left with undermining laterally I think is come in a bit. 10/08/2020 upon evaluation today patient is here today for [employer] application #3. Fortunately he seems to be doing extremely well with regard to this and we are seeing a lot of new epithelial growth which is great news. Fortunately there is no signs of active infection at this time. 10/16/2020 upon evaluation today patient appears to be doing well with regard to his foot ulcers. Do believe the Oasis has been of benefit for him. I do not see any signs of infection right now which is great  news and I think that he has a lot of new epithelial growth which is great to see as well. The patient is very pleased to hear all of this. I do think we can proceed with the Oasis trilayer #4 today. 3/18; not as much improvement in these areas on his heels that I was hoping. I did reapply trilateral Oasis today the tissue looks healthier but not as much fill in as I was hoping. 3/25; better looking today I think this is come in a bit the tissue looks healthier. Triple layer Oasis reapplied #6 4/1; somewhat better looking definitely better looking surface not as much change in surface area as I was hoping. He may be spending more time [place] on days then he needs to although he does have heel offloading boots. Triple layer Oasis reapplied #7 4/7; unfortunately apparently [facility] will not approve any further Oasis which is unfortunate since the patient did respond nicely both in terms of the condition of the wound bed as well as surface area. There is still some drainage coming from the wound but not a lot there does not appear to be any infection 4/15; we have been using Hydrofera Blue. He continues to have nice rims of epithelialization on the right greater than the left. The left the epithelialization is coming from the tip of his heel. There is moderate drainage. In this that concerns me about  a total contact cast. There is no evidence of infection 4/29; patient has been using Hydrofera Blue with dressing changes. He has no complaints or issues today. 5/5; using Hydrofera Blue. I actually think that he looks marginally better than the last time I saw this 3 weeks ago. There are rims of epithelialization on the left thumb coming from the medial side on the right. Using Hydrofera Blue 5/12; using Hydrofera Blue. These continue to make improvements in surface area. His drainage was not listed as severe I therefore went ahead and put a cast on the left foot. Right foot we will continue  to dress his previous 5/16; back for first total contact cast change. He did not tolerate this particularly well cast injury on the anterior tibia among other issues. Difficulty sleeping. I talked him about this in some detail and afterwards is elected to continue. I told him I would like to have a cast on for 3 weeks to see if this is going to help at all. I think he agreed 5/19; I think the wound is better. There is no tunneling towards his midfoot. The undermining medially also looks better. He has a rim of new skin distally. I think we are making progress here. The area on the left also continues to look somewhat better to me using Hydrofera Blue. He has a list of complaints about the cast but none of them seem serious 5/26; patient presents for 1 week follow-up. He has been using a total contact cast and tolerating this well. Hydrofera Blue is the main dressing used. He denies signs of infection. 6/2 Hydrofera Blue total contact cast on the left. These were large ulcers that formed in intensive care unit where the patient was recovering from COVID. May have had something to do with being ventilated in an upright positiono Pressors etc. We have been able to get the areas down considerably and a viable surface. There is some epithelialization in both sides. Note made of drainage 6/9; changed to [facility] last time because of drainage. He arrives with better looking surfaces and dimensions on the left than the right. Paradoxically the right actually probes more towards his midfoot the left is largely close down but both of these look improved. Using a total contact cast on the left 6/16; complex wounds on his bilateral plantar heels which were initially pressure injury from a stay in the ICU with COVID. We have been using silver alginate most recently. His dimensions of come in quite dramatically however not recently. We have been putting the left foot in a total contact cast 6/23; complex  wounds on the bilateral plantar heels. I been putting the left in the cast paradoxically the area on the right is the one that is going towards closure at a faster rate. Quite a bit of drainage on the left. The patient went to see Dr. Logan Bores who said he was going to standby for skin grafts. I had actually considered sending him for skin grafts however he would be mandatorily off his feet for a period of weeks to months. I am thinking that the area on the right is going to close on its own the area on the left has been more stubborn even though we have him in a total contact cast 6/30; took him out of a total contact cast last week is the right heel seem to be making better progress than the left where I was placing the cast. We are using silver alginate. Both  wounds are smaller right greater than left 7/12; both wounds look as though they are making some progress. We are using silver alginate. Heel offloading boots 7/26; very gradual progress especially on the right. Using silver alginate. He is wearing heel offloading boots 8/18; he continues to close these wounds down very gradually. Using silver alginate. The problem polymen being definitive about this is areas of what appears to be callus around the margins. This is not a 100% of the area but certainly sizable especially on the right 9/1; bilateral plantar feet wounds secondary to prolonged pressure while being ventilated for COVID-19 in an upright position. Essentially pressure ulcers on the bottom of his feet. He is made substantial progress using silver alginate. 9/14; bilateral plantar feet wounds secondary to prolonged pressure. Making progress using silver alginate. 9/29 bilateral plantar feet wounds secondary to prolonged pressure. I changed him to Iodoflex last week. MolecuLight showing reddened blush fluorescence 10/11; patient presents for follow-up. He has no issues or complaints today. He denies signs of infection. He continues to use  Iodoflex and antibiotic ointment to the wound beds. 10/27; 2-week follow-up. No evidence of infection. He has callus and thick dry skin around the wound margins we have been using Iodoflex and Bactroban which was in response to a moderate left MolecuLight reddish blush fluorescence. 11/10; 2-week follow-up. Wound margins again have thick callus however the measurements of the actual wound sites are a lot smaller. Everything looks reasonably healthy here. We have been using Iodoflex He was approved for prime matrix but I have elected to delay this given the improvement in the surface area. Hopefully I will not regret that decision as were getting close to the end of the year in terms of insurance payment 12/8; 2-week follow-up. Wounds are generally smaller in size. These were initially substantial wounds extending into the forefoot all the way into the heel on the bilateral plantar feet. They are now both located on the plantar heel distal aspect both of these have a lot of callus around the wounds I used a #5 curette to remove this on the right and the left also some subcutaneous debris to try and get the wound edges were using Iodoflex. He has heel offloading shoe 12/22; 2-week follow-up. Not really much improvement. He has thick callus around the outer edges of both wounds. I remove this there is some nonviable subcutaneous tissue as well. We have been using Iodoflex. Her intake nurse and myself spontaneously thought of a total contact cast I went back in May. At that time we really were not seeing much of an improvement with a cast although the wound was in a much different situation I would like to retry this in 2 weeks and I discussed this with the patient 08/12/2021; the patient has had some improvement with the Iodoflex. The the area on the left heel plantar more improved than the right. I had to put him in a total contact cast on the left although I decided to put that off for 2 weeks. I am  going to change his primary dressing to silver collagen. I think in both areas he has had some improvement most of the healing seems to be more proximal in the heel. The wounds are in the mid aspect. A lot of thick callus on the right heel however. 1/19; we are using silver collagen on both plantar heel areas. He has had some improvement today. The left did not require any debridement. He still had some eschar  on the right that was debrided but both seem to have contracted. I did not put it total contact cast on him today 2/2 we have been using silver collagen. The area on the right plantar heel has areas that appear to be epithelialized interspersed with dry flaking callus and dry skin. I removed this. This really looks better than on the other side. On the left still a large area with raised edges and debris on the surface. 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 He has no issues or complaints today. 2/9; not much change in the left foot wound with 1 week of a cast we are using silver collagen. Silver collagen on the right side. The right side has been the better wound surface. We will reapply the total contact cast on the left [name], [name] ([id number]) [phone].pdf Page 4 of 17 2/16; not much improvement on either side I been using silver collagen with a total contact cast on the left. I'm changing the Hydrofera Blue still with a total contact cast on the left 2/23; some improvement on both sides. Disappointing that he has thick callus around the area that we are putting in a total contact cast on the left. We've been using Hydrofera Blue on both wound areas. This is a man who at essentially pressure ulcers in addition to ischemia caused by medications to support his blood pressure (pressors) in the ICU. He was being ventilated in the  standing position for severe Covid. A Shiley the wounds extended across his entire foot but are now localized to his plantar heels bilaterally. We have made progress however neither areas healed. I continue to think the total contact cast is helped albeit painstakingly slowly. He has never wanted a plastic surgery consult although I don't know that they would be interested in grafting in area in this location. 10/07/2021: Continued improvement bilaterally. There is still some callus around the left wound, despite the total contact cast. He has some increased pain in his right midfoot around 1 particular area. This has been painful in the past but seems to be a little bit worse. When his cast was removed today, there was an area on the heel of the left foot that looks a bit macerated. He is also complaining of pain in his left thigh and hip which he thinks is secondary to the limb length discrepancy caused by the cast. 10/14/2021: He continues to improve. A little bit less callus around the left wound. He continues to endorse pain in his right midfoot, but this is not as significant as it was last week. The maceration on his left heel is improved. 10/21/2021: Continued improvement to both wounds. The maceration on his left heel is no longer evident. Less callus bilaterally. Epithelialization progressing. 10/28/2021: Significant improvement this week. The right sided wound is nearly closed with just a small open area at the middle. No maceration seen on the left heel. Continued epithelialization on both sides. No concern for infection. 11/04/2021: T oday, the wounds were measured a little bit differently and come out as larger, but I actually think they are about the same to potentially even smaller, particularly on the left. He continues to accumulate some callus on the right. 11/11/2021: T oday, the patient is expressing some concern that the left wound, despite being in the total contact cast, is not  progressing at the same rate as the 1 on the right. He is interested in trying a week without the cast to see how the wound does. The wounds are roughly the same size as last week, with the right perhaps being a little bit smaller. He continues to build up callus on both sites. 11/18/2021: Last week, I permitted the patient to go without his total contact cast, just to see if the cast was really making any difference. Today, both wounds have deteriorated to some extent, suggesting that the cast is providing benefit, at least on the left. Both are larger and have accumulated callus, slough, and other debris. 11/26/2021: I debrided both wounds quite aggressively last week in an effort to stimulate the healing cascade. This appears to have been effective as the left sided wound is a full centimeter shorter in length. Although the right was measured slightly larger, on inspection, it looks as though an area of epithelialized tissue was included in the measurements. We have been using PolyMem Ag on the wound surfaces with a total contact cast to the left. 12/02/2021: It appears that the intake personnel are including epithelialized tissue in his wound measurements; the right wound is almost completely epithelialized; there is just a crater at the proximal midfoot with some open areas. On the left, he has built up some callus, but the overall wound surface looks good. There is some senescent skin around the wound margin. He has been in PolyMem Ag bilaterally with a total contact cast on the left. 12/09/2021: The right wound is nearly closed; there is just a small open area at the mid calcaneus. On the left, the wound is smaller with minimal callus buildup. No significant drainage. 12/16/2021: The right calcaneal wound remains minimally open at the mid calcaneus; the rest has epithelialized. On the left, the wound is also a little bit smaller. There is some senescent tissue on the periphery. He is getting his  first application of a trial skin substitute called Vendaje today. 12/23/2021: The wound on his right calcaneus is nearly closed; there is just a small area at the most distal aspect of the calcaneus that is open. On the left, the area where we applied to the skin substitute has a healthier-looking bed of granulation tissue. The wound dimensions are not significantly different on this side but the wound surface is improved. 12/30/2021: The wound on the right calcaneus has not changed significantly aside from some accumulation of callus. On the left, the open area is smaller and continues to have an improved surface. He continues to accumulate callus around the wound. He is here for his third application of Vendaje. 01/06/2022: The right calcaneal wound is down to just a couple of millimeters. He continues to accumulate periwound callus. He unfortunately got his cast wet earlier in the week and his left foot is macerated, resulting in some superficial skin loss just distal to the open wound. The open wound itself, however, is much smaller and has a healthier appearing surface. He is here for his fourth application of Vendaje. 01/13/2022: The right calcaneal wound is about the same. Unfortunately, once again, his cast got wet and his foot is again macerated. This is caused the left calcaneal wound to enlarge. He is here for his fifth application of Vendaje. 01/20/2022: The right calcaneal wound is very small. There is some periwound callus accumulation. He purchased a new cast protector last week and this has been effective in avoiding the maceration that has been occurring  on the left. The left calcaneal wound is narrower and has a healthy and viable-appearing surface. He is here for his 6 application of Vendaje. 01/27/2022: The right calcaneal wound is down to just a pinhole. There is some periwound slough and callus. On the left, the wound is narrower and shorter by about a centimeter. The surface is robust  and viable-appearing. Unfortunately, the rep for the trial skin substitute product did not provide any for [place] to use today. 02/04/2022: The right calcaneal wound remains unchanged. There is more accumulated callus. On the left, although the intake nurse measured it a little bit longer, it looks about the same to me. The surface has a layer of slough, but underneath this, there is good granulation tissue. 02/10/2022: The right calcaneus wound is nearly closed. There is still some callus that builds up around the site. The left side looks about the same in terms of dimensions, but the surface is more robust and vital-appearing. 02/16/2022: The area of the right calcaneus that was nearly closed last week has closed, but there is a small opening at the mid foot where it looks like some moisture got retained and caused some reopening. The left foot wound is narrower and shallower. Both sites have a fair amount of periwound callus and eschar. 02/24/2022: The small midfoot opening on the right calcaneus is a little bit smaller today. The left foot wound is narrower and shallower. He continues to accumulate periwound callus. No concern for infection. 03/01/2022: The patient came to clinic early because he showered and got his cast wet. Fortunately, there is no significant maceration to his foot but the callus softened and it looks like the wound on his left calcaneus may be a little bit wider. The wound on his right calcaneus is just a narrow slit. Continued accumulation of periwound callus bilaterally. 03/08/2022: The wound on his right calcaneus is very nearly closed, just a small pinpoint opening under a bit of eschar; the left wound has come in quite a bit, as well. It is narrower and shorter than at our last visit. Still with accumulated callus and eschar bilaterally. 03/17/2022: The right calcaneal wound is healed. The left wound is smaller and the surface itself is very clean, but there is some blue-green  staining on the periwound callus, concerning for Pseudomonas aeruginosa. [name], [name] ([id number]) [phone].pdf Page 5 of 17 03/23/2022: The right calcaneal wound remains closed. The left wound continues to contract. No further blue-green staining. Small amount of callus and slough accumulation. 03/28/2022: He came in early today because he had gotten his cast wet. On inspection, the wound itself did not get wet or macerated, just a little bit of the forefoot. The wound itself is basically unchanged. 04/07/2022: The right foot wound remains closed. The left wound is the smallest that I have seen it to date. It is narrower and shorter. It still continues to accumulate slough on the surface. 04/15/2022: There is a band of epithelium now dividing the small left plantar foot wound in 2. There is still some slough on the surface. 04/21/2022: The wound continues to narrow. Just a little bit of slough on the surface. He seems to be responding well to endoform. 04/28/2022: Continued slow contraction of the wound. There is a little slough on the surface and some periwound callus. We have been using endoform and total contact cast. 05/05/2022: The wound appears to have stalled. There is slough and some periwound eschar/callus. No concern for infection, however. 05/12/2022: Unfortunately,  his right foot has reopened. It is located at the most posterior aspect of his surgical incision. The area was noted to have drainage coming from it when his padding was removed today. Underneath some callus and senescent skin, there is an opening. No purulent drainage or malodor. On the left foot, the wound is again unchanged. There is some light blue staining on the callus, but no malodor or purulent drainage. 10/13; right and left heel remanence of extensive plantar foot wounds. These are better than I remember by quite a big margin however he is still left with wounds on the left plantar heel and the  right plantar heel. Been using endoform bilaterally. A culture was done that showed apparently Pseudomonas but we are still waiting for the [facility] antibiotic to use gentamicin today. He is still very active by description I am not sure about the offloading of his noncasted right foot 10/20; both wounds right and left heel debrided not much change from last week. [name] has arrived which is linezolid, gentamicin and ciprofloxacin we will use this with endoform. T contact cast on the left otal 06/02/2022: Both wounds are smaller today. There is still a fair amount of callus buildup around the right foot ulcer. The left is more superficial and nearly flush with the surrounding tissues. Also with slough and eschar buildup. 06/10/2022: The right sided wound appears to be nearly closed, if not completely so, although it is somewhat difficult to tell given the abnormal tissue and scarring in his foot. There is a fair amount of callus and crust accumulation. On the left, the wound looks about the same, again with callus and slough. He has an appointment next Thursday with Dr. Loel Lofty in podiatry; I am hopeful that there may be some reconstructive options available for Mr. Klutz. 06/16/2022: Both wounds have some eschar and callus accumulation. The right sided wound is extremely narrow and barely open; the left is narrower than last week. There is a little bit of slough. He has his appointment in podiatry later today. 06/23/2022: The patient met with Dr. Loel Lofty last week and unfortunately, there are no reconstructive options that he believes would be helpful. He did order an MRI to evaluate for osteomyelitis and fortunately, none was seen. The left sided wound is a little bit shorter and narrower today. The right sided wound is about the same. There is callus and eschar accumulation bilaterally. 06/29/2022: Both feet have improved from last week. There is epithelium making a valiant effort to creep  across the surface on the left. The right side looks like it got a little dry and the deep crevasse in his midfoot has cracked. Both have eschar and there is some slough on the left. 07/07/2022: Both feet have improved. There is epithelium completely covering the calcaneus on the right with just a small opening in the crevasse in his midfoot. On the left, the open area of tissue is smaller but he continues to build up callus/eschar and slough. 07/15/2022: The opening in the midfoot on the right is about the same size, covered with eschar and a little bit of slough. The open portion of the left wound is narrower and shorter with a bit of slough buildup. He admits to being on his feet more than recommended. 12/14; as far as I can tell everything on the right foot is closed. There is some eschar I removed some of this I cannot identify any open wound here. As usual this will be a very vulnerable  area going forward. On the left this looks really quite healthy. I was pleasantly surprised to see how good this looked. Wound is certainly smaller and there appears to be healthy epithelialization. He has been using Promogran on the right and endoform on the left. He has been offloading the right foot with a heel offloading boot and he has a running shoe on the right foot 08/04/2022: The right foot remains closed. He has a thick cushioned insole in his sneaker. The left sided wound is smaller with just some slough and eschar accumulation. He is wearing the heel off loader on this foot. 08/15/2022: The right foot remains closed. The left wound has narrowed further. There is some slough and eschar accumulation. 08/25/2022: We put him in a peg assist shoe insert and as a result, he has more epithelialization of the ulcer with minimal slough and eschar accumulation. 09/01/2022: The wound is smaller by about half this week. Still with some slough on the surface. The peg assist shoe seems to be doing a remarkable job  of adequately offloading the site. 09/08/2022: There is a little bit more epithelium coming in. There is some slough and callus buildup. 09/16/2022: The wound measures about the same size, but the epithelium that has grown in looks more robust and stronger. There is some slough and callus buildup. 09/23/2022: The wound remains about the same size. The skin edges are looking rather senescent. 09/29/2022: The aggressive debridement I performed last week seems to have been effective. The wound is smaller and has significantly less slough accumulation. 10/06/2022: There has been quite a bit of epithelialization since last week. There are still some open areas with slough accumulation. There is some callus buildup around the perimeter. 10/13/2022: Continued improvement. Minimal callus accumulation. 3/14; patient presents for follow-up. He has been using endoform to the wound bed without issues. He is using a surgical shoe with peg assist for offloading. 10/27/2022: The wound dimensions are stable. There is some senescent skin accumulation around the perimeter. 11/04/2022: Last week I performed a very aggressive debridement in an effort to stimulate the healing cascade. As has been the case when I have done this before, the wound has responded well. There has been epithelialization and contraction of the wound. There are just a couple of small open areas. 11/10/2022: Unfortunately, his foot got wet secondary to sweating and there has been some breakdown of the tissue, particularly the skin just adjacent to the [name], [name] ([id number]) [phone].pdf Page 6 of 17 main ulcer. 11/18/2022: We continue to struggle with moisture-related tissue breakdown around the perimeter of his wound. This has caused the thin epithelium that had formed on the surface to disintegrate. The underlying surface of the wound has good granulation tissue. 11/25/2022: The edges of the wound are much less macerated,  but the surface is actually looking a little bit dry. There is slough accumulation. No obvious signs of infection. 12/01/2022: The wound edges are more macerated and broken down, making the wound larger. The surface has some slough on it. 12/08/2020: The wound looks better this week. There is significantly less maceration and moisture-related tissue breakdown. There is some slough on the wound surface. 12/15/2022: The wound continues to improve. There is almost no moisture-related tissue breakdown. There is epithelium beginning to fill in again from the edges. Light slough on the wound surface. 12/22/2022: More epithelium has filled him from around the edges. There is some slough on the surface. No evidence of moisture-related tissue breakdown. 01/05/2023: There  has been more epithelialization, particularly at the posterior calcaneal aspect of the wound. There has been a little bit of moisture accumulation with minimal maceration. 01/12/2023: More epithelium has filled in. There is very minimal accumulation of slough. 01/20/2023: The wound is measuring a little bit smaller today. It did measure deeper, but this appears to be secondary to some heaped up senescent skin around the edges. The epithelium that has been present at the posterior aspect of the wound seems to be a bit more robust with greater integrity. 01/26/2023: He reports intense itching to the skin around the wound and there has been some breakdown here. It looks fungal in nature. The wound itself looks pretty good with improved epithelialization with just some slough buildup. He has a little callus along the wound edges. Electronic Signature(s) Signed: 01/26/2023 12:02:16 PM By: [name] MD FACS Entered By: [name] on 01/26/2023 12:02:15 -------------------------------------------------------------------------------- Physical Exam Details Patient Name: Date of Service: [name], TO [name] 01/26/2023 11:00 A M Medical Record  Number: [id number] Patient Account Number: [id number] Date of Birth/Sex: Treating RN: 07/24/1976 (47 y.o. M) Primary Care Provider: Dorinda Hill Other Clinician: Referring Provider: Treating Provider/Extender: Jana Hakim in Treatment: 132 Constitutional . . . . no acute distress. Respiratory Normal work of breathing on room air. Notes 01/26/2023: He reports intense itching to the skin around the wound and there has been some breakdown here. It looks fungal in nature. The wound itself looks pretty good with improved epithelialization with just some slough buildup. He has a little callus along the wound edges. Electronic Signature(s) Signed: 01/26/2023 12:08:48 PM By: [name] MD FACS Previous Signature: 01/26/2023 12:02:32 PM Version By: [name] MD FACS Entered By: [name] on 01/26/2023 12:08:48 -------------------------------------------------------------------------------- Physician Orders Details Patient Name: Date of Service: [name], [name] 01/26/2023 11:00 A M Medical Record Number: [id number] Patient Account Number: [id number] Date of Birth/Sex: Treating RN: 1976/03/12 (47 y.o. [name] Primary Care Provider: Dorinda Hill Other Clinician: NERO, [name] ([id number]) [id number] Page 7 of 17 Referring Provider: Treating Provider/Extender: Jana Hakim in Treatment: 132 Verbal / Phone Orders: No Diagnosis Coding ICD-10 Coding Code Description L97.522 Non-pressure chronic ulcer of other part of left foot with fat layer exposed Follow-up Appointments ppointment in 1 week. - Dr. Lady Gary Room 3 Return A Anesthetic Wound #2 Left Calcaneus (In clinic) Topical Lidocaine 4% applied to wound bed - In clinic Bathing/ Shower/ Hygiene May shower and wash wound with soap and water. Edema Control - Lymphedema / SCD / Other Bilateral Lower Extremities Avoid standing for long  periods of time. Moisturize legs daily. - as needed Off-Loading Other: - keep pressure off of the bottom of your feet. Elevate legs throughout the day. Use the Shoe with the PegAssist off-loading insole Additional Orders / Instructions Follow Nutritious Diet - Try to get 70-100 grams of Protein a day+ Wound Treatment Wound #2 - Calcaneus Wound Laterality: Left Cleanser: Normal Saline (Generic) 1 x Per Day/30 Days Discharge Instructions: Cleanse the wound with Normal Saline prior to applying a clean dressing using gauze sponges, not tissue or cotton balls. Cleanser: Wound Cleanser (Generic) 1 x Per Day/30 Days Discharge Instructions: Cleanse the wound with wound cleanser prior to applying a clean dressing using gauze sponges, not tissue or cotton balls. Peri-Wound Care: Ketoconazole Cream 2% 1 x Per Day/30 Days Discharge Instructions: Apply Ketoconazole as directed Topical: Gentamicin 1 x Per Day/30 Days Topical: compounding topical antibiotics from [facility]  pharmacy 1 x Per Day/30 Days Discharge Instructions: HOLD Prim Dressing: Maxorb Extra Ag+ Alginate Dressing, 4x4.75 (in/in) 1 x Per Day/30 Days ary Discharge Instructions: Apply to wound bed as instructed Secondary Dressing: Drawtex 4x4 in 1 x Per Day/30 Days Discharge Instructions: Apply over primary dressing as directed. Secondary Dressing: Optifoam Non-Adhesive Dressing, 4x4 in (Generic) 1 x Per Day/30 Days Discharge Instructions: Apply over primary dressing as directed. Secondary Dressing: Woven Gauze Sponge, Non-Sterile 4x4 in (Generic) 1 x Per Day/30 Days Discharge Instructions: Apply over primary dressing as directed. Secured With: 52M Medipore H Soft Cloth Surgical T ape, 4 x 10 (in/yd) (Generic) 1 x Per Day/30 Days Discharge Instructions: Secure with tape as directed. Compression Wrap: Kerlix Roll 4.5x3.1 (in/yd) 1 x Per Day/30 Days Discharge Instructions: Apply Kerlix and Coban compression as directed. Add-Ons: Rooke  Vascular Offloading Boot, Size Regular 1 x Per Day/30 Days Electronic Signature(s) Signed: 01/26/2023 12:24:43 PM By: [name] MD FACS Entered By: [name] on 01/26/2023 12:09:28 [name] ([id number]) [id number] Page 8 of 17 -------------------------------------------------------------------------------- Problem List Details Patient Name: Date of Service: [name] 01/26/2023 11:00 A M Medical Record Number: [id number] Patient Account Number: [id number] Date of Birth/Sex: Treating RN: 1975/09/26 (47 y.o. M) Primary Care Provider: Dorinda Hill Other Clinician: Referring Provider: Treating Provider/Extender: Jana Hakim in Treatment: 132 Active Problems ICD-10 Encounter Code Description Active Date MDM Diagnosis L97.522 Non-pressure chronic ulcer of other part of left foot with fat layer exposed 09/03/2020 No Yes Inactive Problems ICD-10 Code Description Active Date Inactive Date L97.512 Non-pressure chronic ulcer of other part of right foot with fat layer exposed 09/03/2020 09/03/2020 L89.893 Pressure ulcer of other site, stage 3 07/15/2020 07/15/2020 M62.81 Muscle weakness (generalized) 07/15/2020 07/15/2020 I10 Essential (primary) hypertension 07/15/2020 07/15/2020 M86.171 Other acute osteomyelitis, right ankle and foot 09/03/2020 09/03/2020 Resolved Problems Electronic Signature(s) Signed: 01/26/2023 11:56:50 AM By: [name] MD FACS Entered By: [name] on 01/26/2023 11:56:49 -------------------------------------------------------------------------------- Progress Note Details Patient Name: Date of Service: [name], [name] 01/26/2023 11:00 A M Medical Record Number: [id number] Patient Account Number: [id number] Date of Birth/Sex: Treating RN: [birth year] (47 y.o. M) Primary Care Provider: Dorinda Hill Other Clinician: Referring Provider: Treating Provider/Extender: Jana Hakim in Treatment: 132 Subjective Chief Complaint Information obtained from Patient YARNELL, ARVIDSON ([id number]) [id number] Page 9 of 17 Bilateral Plantar Foot Ulcers History of Present Illness (HPI) Wounds are12/03/2020 upon evaluation today patient presents for initial inspection here in our clinic concerning issues he has been having with the bottoms of his feet bilaterally. He states these actually occurred as wounds when he was hospitalized for 5 months secondary to Covid. He was apparently with tilting bed where he was in an upright position quite frequently and apparently this occurred in some way shape or form during that time. Fortunately there is no sign of active infection at this time. No fevers, chills, nausea, vomiting, or diarrhea. With that being said he still has substantial wounds on the plantar aspects of his feet Theragen require quite a bit of work to get these to heal. He has been using Santyl currently though that is been problematic both in receiving the medication as well as actually paid for it as it is become quite expensive. Prior to the experience with Covid the patient really did not have any major medical problems other than hypertension he does have some mild generalized weakness following the Covid experience. 07/22/2020 on evaluation today patient  appears to be doing okay in regard to his foot ulcers I feel like the wound beds are showing signs of better improvement that I do believe the Iodoflex is helping in this regard. With that being said he does have a lot of drainage currently and this is somewhat blue/green in nature which is consistent with Pseudomonas. I do think a culture today would be appropriate for [place] to evaluate and see if that is indeed the case I would likely start him on antibiotic orally as well he is not allergic to Cipro knows of no issues he has had in the past 12/21; patient was admitted to the clinic  earlier this month with bilateral presumed pressure ulcers on the bottom of his feet apparently related to excessive pressure from a tilt table arrangement in the intensive care unit. Patient relates this to being on ECMO but I am not really sure that is exactly related to that. I must say I have never seen anything like this. He has fairly extensive full-thickness wounds extending from his heel towards his midfoot mostly centered laterally. There is already been some healing distally. He does not appear to have an arterial issue. He has been using gentamicin to the wound surfaces with Iodoflex to help with ongoing debridement 1/6; this is a patient with pressure ulcers on the bottom of his feet related to excessive pressure from a standing position in the intensive care unit. He is complaining of a lot of pain in the right heel. He is not a diabetic. 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 We have been using Iodoflex to help prepare the surfaces of both wounds for an advanced treatment product. He is nonambulatory spending most of his time in a wheelchair I have asked him not to propel the wheelchair with his heels 1/13; in general his wounds look better not much surface area change we have been using Iodoflex as of last week. I did an x-ray of the right heel as the patient was complaining of pain. I had some thoughts about a stress fracture perhaps Achilles tendon problems however what it showed was erosive changes along the inferior aspect of the calcaneus he now has a MRI booked for 1/20. 1/20; in general his wounds continue to be better. Some improvement in the large narrow areas proximally in his foot. He is still complaining of pain in the right heel and tenderness in certain areas of this wound. His MRI is tonight. I am not just looking for osteomyelitis that was brought up on the x-ray I am wondering about stress fractures, tendon ruptures etc. He has no such findings  on the left. Also noteworthy is that the patient had critical illness neuropathy and some of the discomfort may be actual improvement in nerve function I am just not sure. These wounds were initially in the setting of severe critical illness related to COVID-19. He was put in a standing position. He may have also been on pressors at the point contributing to tissue ischemia. By his description at some point these wounds were grossly necrotic extending proximally up into the Achilles part of his heel. I do not know that I have ever really seen pictures of them like this although they may exist in epic We have ordered Tri layer Oasis. I am trying to stimulate some granulation in these areas. This is of course assuming the MRI is negative for infection 1/27; since the patient was last here he saw Dr. Earlene Plater  of infectious disease. He is planned for vancomycin and ceftriaxone. Prior operative culture grew MSSA. Also ordered baseline lab work. He also ordered arterial studies although the ABIs in our clinic were normal as well as his clinical exam these were normal I do not think he needs to see vascular surgery. His ABIs at the [facility] were 1.22 in the right triphasic waveforms with a normal TBI of 1.15 on the left ABI of 1.22 with triphasic waveforms and a normal TBI of 1.08. Finally he saw Dr. Logan Bores who will follow him in for 2 months. At this point I do not think he felt that he needed a procedure on the right calcaneal bone. Dr. Earlene Plater is elected for broad-spectrum antibiotic The patient is still having pain in the right heel. He walks with a walker 2/3; wounds are generally smaller. He is tolerating his IV antibiotics. I believe this is vancomycin and ceftriaxone. We are still waiting for Oasis burn in terms of his out-of-pocket max which he should be meeting soon given the IV antibiotics, MRIs etc. I have asked him to check in on this. We are using silver collagen in the meantime the wounds look  better 2/10; tolerating IV vancomycin and Rocephin. We are waiting to apply for Oasis. Although I am not really sure where he is in his out-of-pocket max. 2/17 started the first application of Oasis trilayer. Still on antibiotics. The wounds have generally look better. The area on the left has a little more surface slough requiring debridement 2/24; second application of Oasis trilayer. The wound surface granulation is generally look better. The area on the left with undermining laterally I think is come in a bit. 10/08/2020 upon evaluation today patient is here today for [employer] application #3. Fortunately he seems to be doing extremely well with regard to this and we are seeing a lot of new epithelial growth which is great news. Fortunately there is no signs of active infection at this time. 10/16/2020 upon evaluation today patient appears to be doing well with regard to his foot ulcers. Do believe the Oasis has been of benefit for him. I do not see any signs of infection right now which is great news and I think that he has a lot of new epithelial growth which is great to see as well. The patient is very pleased to hear all of this. I do think we can proceed with the Oasis trilayer #4 today. 3/18; not as much improvement in these areas on his heels that I was hoping. I did reapply trilateral Oasis today the tissue looks healthier but not as much fill in as I was hoping. 3/25; better looking today I think this is come in a bit the tissue looks healthier. Triple layer Oasis reapplied #6 4/1; somewhat better looking definitely better looking surface not as much change in surface area as I was hoping. He may be spending more time [place] on days then he needs to although he does have heel offloading boots. Triple layer Oasis reapplied #7 4/7; unfortunately apparently [facility] will not approve any further Oasis which is unfortunate since the patient did respond nicely both in terms of  the condition of the wound bed as well as surface area. There is still some drainage coming from the wound but not a lot there does not appear to be any infection 4/15; we have been using Hydrofera Blue. He continues to have nice rims of epithelialization on the right greater than the  left. The left the epithelialization is coming from the tip of his heel. There is moderate drainage. In this that concerns me about a total contact cast. There is no evidence of infection 4/29; patient has been using Hydrofera Blue with dressing changes. He has no complaints or issues today. 5/5; using Hydrofera Blue. I actually think that he looks marginally better than the last time I saw this 3 weeks ago. There are rims of epithelialization on the left thumb coming from the medial side on the right. Using Hydrofera Blue 5/12; using Hydrofera Blue. These continue to make improvements in surface area. His drainage was not listed as severe I therefore went ahead and put a cast on the left foot. Right foot we will continue to dress his previous 5/16; back for first total contact cast change. He did not tolerate this particularly well cast injury on the anterior tibia among other issues. Difficulty sleeping. I talked him about this in some detail and afterwards is elected to continue. I told him I would like to have a cast on for 3 weeks to see if this is going to help at all. I think he agreed 5/19; I think the wound is better. There is no tunneling towards his midfoot. The undermining medially also looks better. He has a rim of new skin distally. I think we are making progress here. The area on the left also continues to look somewhat better to me using Hydrofera Blue. He has a list of complaints about the cast but none of them seem serious 5/26; patient presents for 1 week follow-up. He has been using a total contact cast and tolerating this well. Hydrofera Blue is the main dressing used. He denies signs of  infection. 6/2 Hydrofera Blue total contact cast on the left. These were large ulcers that formed in intensive care unit where the patient was recovering from COVID. [address] ([id number]) [phone].pdf Page 10 of 17 have had something to do with being ventilated in an upright positiono Pressors etc. We have been able to get the areas down considerably and a viable surface. There is some epithelialization in both sides. Note made of drainage 6/9; changed to [facility] last time because of drainage. He arrives with better looking surfaces and dimensions on the left than the right. Paradoxically the right actually probes more towards his midfoot the left is largely close down but both of these look improved. Using a total contact cast on the left 6/16; complex wounds on his bilateral plantar heels which were initially pressure injury from a stay in the ICU with COVID. We have been using silver alginate most recently. His dimensions of come in quite dramatically however not recently. We have been putting the left foot in a total contact cast 6/23; complex wounds on the bilateral plantar heels. I been putting the left in the cast paradoxically the area on the right is the one that is going towards closure at a faster rate. Quite a bit of drainage on the left. The patient went to see Dr. Logan Bores who said he was going to standby for skin grafts. I had actually considered sending him for skin grafts however he would be mandatorily off his feet for a period of weeks to months. I am thinking that the area on the right is going to close on its own the area on the left has been more stubborn even though we have him in a total contact cast 6/30; took him out of  a total contact cast last week is the right heel seem to be making better progress than the left where I was placing the cast. We are using silver alginate. Both wounds are smaller right greater than left 7/12; both  wounds look as though they are making some progress. We are using silver alginate. Heel offloading boots 7/26; very gradual progress especially on the right. Using silver alginate. He is wearing heel offloading boots 8/18; he continues to close these wounds down very gradually. Using silver alginate. The problem polymen being definitive about this is areas of what appears to be callus around the margins. This is not a 100% of the area but certainly sizable especially on the right 9/1; bilateral plantar feet wounds secondary to prolonged pressure while being ventilated for COVID-19 in an upright position. Essentially pressure ulcers on the bottom of his feet. He is made substantial progress using silver alginate. 9/14; bilateral plantar feet wounds secondary to prolonged pressure. Making progress using silver alginate. 9/29 bilateral plantar feet wounds secondary to prolonged pressure. I changed him to Iodoflex last week. MolecuLight showing reddened blush fluorescence 10/11; patient presents for follow-up. He has no issues or complaints today. He denies signs of infection. He continues to use Iodoflex and antibiotic ointment to the wound beds. 10/27; 2-week follow-up. No evidence of infection. He has callus and thick dry skin around the wound margins we have been using Iodoflex and Bactroban which was in response to a moderate left MolecuLight reddish blush fluorescence. 11/10; 2-week follow-up. Wound margins again have thick callus however the measurements of the actual wound sites are a lot smaller. Everything looks reasonably healthy here. We have been using Iodoflex He was approved for prime matrix but I have elected to delay this given the improvement in the surface area. Hopefully I will not regret that decision as were getting close to the end of the year in terms of insurance payment 12/8; 2-week follow-up. Wounds are generally smaller in size. These were initially substantial wounds  extending into the forefoot all the way into the heel on the bilateral plantar feet. They are now both located on the plantar heel distal aspect both of these have a lot of callus around the wounds I used a #5 curette to remove this on the right and the left also some subcutaneous debris to try and get the wound edges were using Iodoflex. He has heel offloading shoe 12/22; 2-week follow-up. Not really much improvement. He has thick callus around the outer edges of both wounds. I remove this there is some nonviable subcutaneous tissue as well. We have been using Iodoflex. Her intake nurse and myself spontaneously thought of a total contact cast I went back in May. At that time we really were not seeing much of an improvement with a cast although the wound was in a much different situation I would like to retry this in 2 weeks and I discussed this with the patient 08/12/2021; the patient has had some improvement with the Iodoflex. The the area on the left heel plantar more improved than the right. I had to put him in a total contact cast on the left although I decided to put that off for 2 weeks. I am going to change his primary dressing to silver collagen. I think in both areas he has had some improvement most of the healing seems to be more proximal in the heel. The wounds are in the mid aspect. A lot of thick callus on the right  heel however. 1/19; we are using silver collagen on both plantar heel areas. He has had some improvement today. The left did not require any debridement. He still had some eschar on the right that was debrided but both seem to have contracted. I did not put it total contact cast on him today 2/2 we have been using silver collagen. The area on the right plantar heel has areas that appear to be epithelialized interspersed with dry flaking callus and dry skin. I removed this. This really looks better than on the other side. On the left still a large area with raised edges and  debris on the surface. 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 He has no issues or complaints today. 2/9; not much change in the left foot wound with 1 week of a cast we are using silver collagen. Silver collagen on the right side. The right side has been the better wound surface. We will reapply the total contact cast on the left 2/16; not much improvement on either side I been using silver collagen with a total contact cast on the left. I'm changing the Hydrofera Blue still with a total contact cast on the left 2/23; some improvement on both sides. Disappointing that he has thick callus around the area that we are putting in a total contact cast on the left. We've been using Hydrofera Blue on both wound areas. This is a man who at essentially pressure ulcers in addition to ischemia caused by medications to support his blood pressure (pressors) in the ICU. He was being ventilated in the standing position for severe Covid. A Shiley the wounds extended across his entire foot but are now localized to his plantar heels bilaterally. We have made progress however neither areas healed. I continue to think the total contact cast is helped albeit painstakingly slowly. He has never wanted a plastic surgery consult although I don't know that they would be interested in grafting in area in this location. 10/07/2021: Continued improvement bilaterally. There is still some callus around the left wound, despite the total contact cast. He has some increased pain in his right midfoot around 1 particular area. This has been painful in the past but seems to be a little bit worse. When his cast was removed today, there was an area on the heel of the left foot that looks a bit macerated. He is also complaining of pain in his left thigh and hip which he thinks is secondary to the limb  length discrepancy caused by the cast. 10/14/2021: He continues to improve. A little bit less callus around the left wound. He continues to endorse pain in his right midfoot, but this is not as significant as it was last week. The maceration on his left heel is improved. 10/21/2021: Continued improvement to both wounds. The maceration on his left heel is no longer evident. Less callus bilaterally. Epithelialization progressing. 10/28/2021: Significant improvement this week. The right sided wound is nearly closed with just a small open area at the middle. No maceration seen on the left heel. Continued epithelialization on both sides. No concern for infection. 11/04/2021: T oday, the wounds were measured a little bit differently and come out as larger, but I actually think they are about the same to potentially even smaller, particularly on the left. He continues to accumulate some callus on the right.  11/11/2021: T oday, the patient is expressing some concern that the left wound, despite being in the total contact cast, is not progressing at the same rate as the 1 on the right. He is interested in trying a week without the cast to see how the wound does. The wounds are roughly the same size as last week, with the right perhaps being a little bit smaller. He continues to build up callus on both sites. 11/18/2021: Last week, I permitted the patient to go without his total contact cast, just to see if the cast was really making any difference. Today, both wounds have deteriorated to some extent, suggesting that the cast is providing benefit, at least on the left. Both are larger and have accumulated callus, slough, and other debris. 11/26/2021: I debrided both wounds quite aggressively last week in an effort to stimulate the healing cascade. This appears to have been effective as the left sided wound is a full centimeter shorter in length. Although the right was measured slightly larger, on inspection, it looks  as though an area of epithelialized [name], [name] ([id number]) [phone].pdf Page 11 of 17 tissue was included in the measurements. We have been using PolyMem Ag on the wound surfaces with a total contact cast to the left. 12/02/2021: It appears that the intake personnel are including epithelialized tissue in his wound measurements; the right wound is almost completely epithelialized; there is just a crater at the proximal midfoot with some open areas. On the left, he has built up some callus, but the overall wound surface looks good. There is some senescent skin around the wound margin. He has been in PolyMem Ag bilaterally with a total contact cast on the left. 12/09/2021: The right wound is nearly closed; there is just a small open area at the mid calcaneus. On the left, the wound is smaller with minimal callus buildup. No significant drainage. 12/16/2021: The right calcaneal wound remains minimally open at the mid calcaneus; the rest has epithelialized. On the left, the wound is also a little bit smaller. There is some senescent tissue on the periphery. He is getting his first application of a trial skin substitute called Vendaje today. 12/23/2021: The wound on his right calcaneus is nearly closed; there is just a small area at the most distal aspect of the calcaneus that is open. On the left, the area where we applied to the skin substitute has a healthier-looking bed of granulation tissue. The wound dimensions are not significantly different on this side but the wound surface is improved. 12/30/2021: The wound on the right calcaneus has not changed significantly aside from some accumulation of callus. On the left, the open area is smaller and continues to have an improved surface. He continues to accumulate callus around the wound. He is here for his third application of Vendaje. 01/06/2022: The right calcaneal wound is down to just a couple of millimeters. He continues to  accumulate periwound callus. He unfortunately got his cast wet earlier in the week and his left foot is macerated, resulting in some superficial skin loss just distal to the open wound. The open wound itself, however, is much smaller and has a healthier appearing surface. He is here for his fourth application of Vendaje. 01/13/2022: The right calcaneal wound is about the same. Unfortunately, once again, his cast got wet and his foot is again macerated. This is caused the left calcaneal wound to enlarge. He is here for his fifth application of Vendaje. 01/20/2022: The right  calcaneal wound is very small. There is some periwound callus accumulation. He purchased a new cast protector last week and this has been effective in avoiding the maceration that has been occurring on the left. The left calcaneal wound is narrower and has a healthy and viable-appearing surface. He is here for his 6 application of Vendaje. 01/27/2022: The right calcaneal wound is down to just a pinhole. There is some periwound slough and callus. On the left, the wound is narrower and shorter by about a centimeter. The surface is robust and viable-appearing. Unfortunately, the rep for the trial skin substitute product did not provide any for [place] to use today. 02/04/2022: The right calcaneal wound remains unchanged. There is more accumulated callus. On the left, although the intake nurse measured it a little bit longer, it looks about the same to me. The surface has a layer of slough, but underneath this, there is good granulation tissue. 02/10/2022: The right calcaneus wound is nearly closed. There is still some callus that builds up around the site. The left side looks about the same in terms of dimensions, but the surface is more robust and vital-appearing. 02/16/2022: The area of the right calcaneus that was nearly closed last week has closed, but there is a small opening at the mid foot where it looks like some moisture got retained and  caused some reopening. The left foot wound is narrower and shallower. Both sites have a fair amount of periwound callus and eschar. 02/24/2022: The small midfoot opening on the right calcaneus is a little bit smaller today. The left foot wound is narrower and shallower. He continues to accumulate periwound callus. No concern for infection. 03/01/2022: The patient came to clinic early because he showered and got his cast wet. Fortunately, there is no significant maceration to his foot but the callus softened and it looks like the wound on his left calcaneus may be a little bit wider. The wound on his right calcaneus is just a narrow slit. Continued accumulation of periwound callus bilaterally. 03/08/2022: The wound on his right calcaneus is very nearly closed, just a small pinpoint opening under a bit of eschar; the left wound has come in quite a bit, as well. It is narrower and shorter than at our last visit. Still with accumulated callus and eschar bilaterally. 03/17/2022: The right calcaneal wound is healed. The left wound is smaller and the surface itself is very clean, but there is some blue-green staining on the periwound callus, concerning for Pseudomonas aeruginosa. 03/23/2022: The right calcaneal wound remains closed. The left wound continues to contract. No further blue-green staining. Small amount of callus and slough accumulation. 03/28/2022: He came in early today because he had gotten his cast wet. On inspection, the wound itself did not get wet or macerated, just a little bit of the forefoot. The wound itself is basically unchanged. 04/07/2022: The right foot wound remains closed. The left wound is the smallest that I have seen it to date. It is narrower and shorter. It still continues to accumulate slough on the surface. 04/15/2022: There is a band of epithelium now dividing the small left plantar foot wound in 2. There is still some slough on the surface. 04/21/2022: The wound continues to  narrow. Just a little bit of slough on the surface. He seems to be responding well to endoform. 04/28/2022: Continued slow contraction of the wound. There is a little slough on the surface and some periwound callus. We have been using endoform and total  contact cast. 05/05/2022: The wound appears to have stalled. There is slough and some periwound eschar/callus. No concern for infection, however. 05/12/2022: Unfortunately, his right foot has reopened. It is located at the most posterior aspect of his surgical incision. The area was noted to have drainage coming from it when his padding was removed today. Underneath some callus and senescent skin, there is an opening. No purulent drainage or malodor. On the left foot, the wound is again unchanged. There is some light blue staining on the callus, but no malodor or purulent drainage. 10/13; right and left heel remanence of extensive plantar foot wounds. These are better than I remember by quite a big margin however he is still left with wounds on the left plantar heel and the right plantar heel. Been using endoform bilaterally. A culture was done that showed apparently Pseudomonas but we are still waiting for the [facility] antibiotic to use gentamicin today. He is still very active by description I am not sure about the offloading of his noncasted right foot 10/20; both wounds right and left heel debrided not much change from last week. [name] Echevaria has arrived which is linezolid, gentamicin and ciprofloxacin we will use this with endoform. T contact cast on the left otal 06/02/2022: Both wounds are smaller today. There is still a fair amount of callus buildup around the right foot ulcer. The left is more superficial and nearly flush with the surrounding tissues. Also with slough and eschar buildup. 06/10/2022: The right sided wound appears to be nearly closed, if not completely so, although it is somewhat difficult to tell given the abnormal tissue  and scarring in his foot. There is a fair amount of callus and crust accumulation. On the left, the wound looks about the same, again with callus and slough. He has an appointment next Thursday with Dr. Annamary Rummage in podiatry; I am hopeful that there may be some reconstructive options available for Mr. Tillery. TYE, VIGO ([id number]) [phone].pdf Page 12 of 17 06/16/2022: Both wounds have some eschar and callus accumulation. The right sided wound is extremely narrow and barely open; the left is narrower than last week. There is a little bit of slough. He has his appointment in podiatry later today. 06/23/2022: The patient met with Dr. Annamary Rummage last week and unfortunately, there are no reconstructive options that he believes would be helpful. He did order an MRI to evaluate for osteomyelitis and fortunately, none was seen. The left sided wound is a little bit shorter and narrower today. The right sided wound is about the same. There is callus and eschar accumulation bilaterally. 06/29/2022: Both feet have improved from last week. There is epithelium making a valiant effort to creep across the surface on the left. The right side looks like it got a little dry and the deep crevasse in his midfoot has cracked. Both have eschar and there is some slough on the left. 07/07/2022: Both feet have improved. There is epithelium completely covering the calcaneus on the right with just a small opening in the crevasse in his midfoot. On the left, the open area of tissue is smaller but he continues to build up callus/eschar and slough. 07/15/2022: The opening in the midfoot on the right is about the same size, covered with eschar and a little bit of slough. The open portion of the left wound is narrower and shorter with a bit of slough buildup. He admits to being on his feet more than recommended. 12/14; as far as I can tell  everything on the right foot is closed. There is some eschar  I removed some of this I cannot identify any open wound here. As usual this will be a very vulnerable area going forward. On the left this looks really quite healthy. I was pleasantly surprised to see how good this looked. Wound is certainly smaller and there appears to be healthy epithelialization. He has been using Promogran on the right and endoform on the left. He has been offloading the right foot with a heel offloading boot and he has a running shoe on the right foot 08/04/2022: The right foot remains closed. He has a thick cushioned insole in his sneaker. The left sided wound is smaller with just some slough and eschar accumulation. He is wearing the heel off loader on this foot. 08/15/2022: The right foot remains closed. The left wound has narrowed further. There is some slough and eschar accumulation. 08/25/2022: We put him in a peg assist shoe insert and as a result, he has more epithelialization of the ulcer with minimal slough and eschar accumulation. 09/01/2022: The wound is smaller by about half this week. Still with some slough on the surface. The peg assist shoe seems to be doing a remarkable job of adequately offloading the site. 09/08/2022: There is a little bit more epithelium coming in. There is some slough and callus buildup. 09/16/2022: The wound measures about the same size, but the epithelium that has grown in looks more robust and stronger. There is some slough and callus buildup. 09/23/2022: The wound remains about the same size. The skin edges are looking rather senescent. 09/29/2022: The aggressive debridement I performed last week seems to have been effective. The wound is smaller and has significantly less slough accumulation. 10/06/2022: There has been quite a bit of epithelialization since last week. There are still some open areas with slough accumulation. There is some callus buildup around the perimeter. 10/13/2022: Continued improvement. Minimal callus accumulation. 3/14;  patient presents for follow-up. He has been using endoform to the wound bed without issues. He is using a surgical shoe with peg assist for offloading. 10/27/2022: The wound dimensions are stable. There is some senescent skin accumulation around the perimeter. 11/04/2022: Last week I performed a very aggressive debridement in an effort to stimulate the healing cascade. As has been the case when I have done this before, the wound has responded well. There has been epithelialization and contraction of the wound. There are just a couple of small open areas. 11/10/2022: Unfortunately, his foot got wet secondary to sweating and there has been some breakdown of the tissue, particularly the skin just adjacent to the main ulcer. 11/18/2022: We continue to struggle with moisture-related tissue breakdown around the perimeter of his wound. This has caused the thin epithelium that had formed on the surface to disintegrate. The underlying surface of the wound has good granulation tissue. 11/25/2022: The edges of the wound are much less macerated, but the surface is actually looking a little bit dry. There is slough accumulation. No obvious signs of infection. 12/01/2022: The wound edges are more macerated and broken down, making the wound larger. The surface has some slough on it. 12/08/2020: The wound looks better this week. There is significantly less maceration and moisture-related tissue breakdown. There is some slough on the wound surface. 12/15/2022: The wound continues to improve. There is almost no moisture-related tissue breakdown. There is epithelium beginning to fill in again from the edges. Light slough on the wound surface. 12/22/2022: More epithelium  has filled him from around the edges. There is some slough on the surface. No evidence of moisture-related tissue breakdown. 01/05/2023: There has been more epithelialization, particularly at the posterior calcaneal aspect of the wound. There has been a little bit  of moisture accumulation with minimal maceration. 01/12/2023: More epithelium has filled in. There is very minimal accumulation of slough. 01/20/2023: The wound is measuring a little bit smaller today. It did measure deeper, but this appears to be secondary to some heaped up senescent skin around the edges. The epithelium that has been present at the posterior aspect of the wound seems to be a bit more robust with greater integrity. 01/26/2023: He reports intense itching to the skin around the wound and there has been some breakdown here. It looks fungal in nature. The wound itself looks pretty good with improved epithelialization with just some slough buildup. He has a little callus along the wound edges. Patient History Information obtained from Patient. Family History Cancer - Maternal Grandparents, Diabetes - Father,Paternal Grandparents, Heart Disease - Maternal Grandparents, Hypertension - Father,Paternal Grandparents, Lung Disease - Siblings, [name], [name] ([id number]) [id number] Page 13 of 17 No family history of Hereditary Spherocytosis, Kidney Disease, Seizures, Stroke, Thyroid Problems, Tuberculosis. Social History Never smoker, Marital Status - Married, Alcohol Use - Never, Drug Use - No History, Caffeine Use - Daily - tea, soda. Medical History Eyes Denies history of Cataracts, Glaucoma, Optic Neuritis Ear/Nose/Mouth/Throat Denies history of Chronic sinus problems/congestion, Middle ear problems Hematologic/Lymphatic Denies history of Anemia, Hemophilia, Human Immunodeficiency Virus, Lymphedema, Sickle Cell Disease Respiratory Patient has history of Asthma Denies history of Aspiration, Chronic Obstructive Pulmonary Disease (COPD), Pneumothorax, Sleep Apnea, Tuberculosis Cardiovascular Patient has history of Angina - with COVID, Hypertension Denies history of Arrhythmia, Congestive Heart Failure, Coronary Artery Disease, Deep Vein Thrombosis,  Hypotension, Myocardial Infarction, Peripheral Arterial Disease, Peripheral Venous Disease, Phlebitis, Vasculitis Gastrointestinal Denies history of Cirrhosis , Colitis, Crohns, Hepatitis A, Hepatitis B, Hepatitis C Endocrine Denies history of Type I Diabetes, Type II Diabetes Genitourinary Denies history of End Stage Renal Disease Immunological Denies history of Lupus Erythematosus, Raynauds, Scleroderma Integumentary (Skin) Denies history of History of Burn Musculoskeletal Denies history of Gout, Rheumatoid Arthritis, Osteoarthritis, Osteomyelitis Neurologic Denies history of Dementia, Neuropathy, Quadriplegia, Paraplegia, Seizure Disorder Oncologic Denies history of Received Chemotherapy, Received Radiation Psychiatric Denies history of Anorexia/bulimia, Confinement Anxiety Hospitalization/Surgery History - COVID PNA 07/22/2019- 11/14/2019. - 03/27/2020 wound debridement/ skin graft. Medical A Surgical History Notes nd Constitutional Symptoms (General Health) COVID PNA 07/22/2019-11/14/2019 VENT ECMO, foot drop left foot , Genitourinary kidney stone Psychiatric anxiety Objective Constitutional no acute distress. Vitals Time Taken: 11:21 AM, Height: 69 in, Weight: 280 lbs, BMI: 41.3, Temperature: 98.9 F, Pulse: 84 bpm, Respiratory Rate: 18 breaths/min, Blood Pressure: 133/81 mmHg. Respiratory Normal work of breathing on room air. General Notes: 01/26/2023: He reports intense itching to the skin around the wound and there has been some breakdown here. It looks fungal in nature. The wound itself looks pretty good with improved epithelialization with just some slough buildup. He has a little callus along the wound edges. Integumentary (Hair, Skin) Wound #2 status is Open. Original cause of wound was Pressure Injury. The date acquired was: 10/07/2019. The wound has been in treatment 132 weeks. The wound is located on the Left Calcaneus. The wound measures 3.4cm length x 1cm width x  0.3cm depth; 2.67cm^2 area and 0.801cm^3 volume. There is Fat Layer (Subcutaneous Tissue) exposed. There is no tunneling or undermining noted. There is a medium  amount of serosanguineous drainage noted. The wound margin is distinct with the outline attached to the wound base. There is medium (34-66%) red, pink granulation within the wound bed. There is a medium (34-66%) amount of necrotic tissue within the wound bed including Adherent Slough. The periwound skin appearance exhibited: Dry/Scaly, Hemosiderin Staining. The periwound skin appearance did not exhibit: Callus, Crepitus, Excoriation, Induration, Rash, Scarring, Maceration, Atrophie Blanche, Cyanosis, Ecchymosis, Mottled, Pallor, Rubor, Erythema. Periwound temperature was noted as No Abnormality. Assessment [address] [name], [name] ([id number]) [phone].pdf Page 14 of 17 ICD-10 Non-pressure chronic ulcer of other part of left foot with fat layer exposed Procedures Wound #2 Pre-procedure diagnosis of Wound #2 is a Pressure Ulcer located on the Left Calcaneus . There was a Selective/Open Wound Non-Viable Tissue Debridement with a total area of 2.67 sq cm performed by [name], MD. With the following instrument(s): Curette to remove Viable and Non-Viable tissue/material. Material removed includes [facility] after achieving pain control using Lidocaine 5% topical ointment. No specimens were taken. A time out was conducted at 11:40, prior to the start of the procedure. A Minimum amount of bleeding was controlled with Pressure. The procedure was tolerated well with a pain level of 0 throughout and a pain level of 0 following the procedure. Post Debridement Measurements: 3.4cm length x 1cm width x 0.3cm depth; 0.801cm^3 volume. Post debridement Stage noted as Category/Stage III. Character of Wound/Ulcer Post Debridement is stable. Post procedure Diagnosis Wound #2: Same as Pre-Procedure General Notes: scribed  for Dr. Lady Gary by Brenton Grills, RN. Plan Follow-up Appointments: Return Appointment in 1 week. - Dr. Lady Gary Room 3 Anesthetic: Wound #2 Left Calcaneus: (In clinic) Topical Lidocaine 4% applied to wound bed - In clinic Bathing/ Shower/ Hygiene: May shower and wash wound with soap and water. Edema Control - Lymphedema / SCD / Other: Avoid standing for long periods of time. Moisturize legs daily. - as needed Off-Loading: Other: - keep pressure off of the bottom of your feet. Elevate legs throughout the day. Use the Shoe with the PegAssist off-loading insole Additional Orders / Instructions: Follow Nutritious Diet - Try to get 70-100 grams of Protein a day+ WOUND #2: - Calcaneus Wound Laterality: Left Cleanser: Normal Saline (Generic) 1 x Per Day/30 Days Discharge Instructions: Cleanse the wound with Normal Saline prior to applying a clean dressing using gauze sponges, not tissue or cotton balls. Cleanser: Wound Cleanser (Generic) 1 x Per Day/30 Days Discharge Instructions: Cleanse the wound with wound cleanser prior to applying a clean dressing using gauze sponges, not tissue or cotton balls. Peri-Wound Care: Ketoconazole Cream 2% 1 x Per Day/30 Days Discharge Instructions: Apply Ketoconazole as directed Topical: Gentamicin 1 x Per Day/30 Days Topical: compounding topical antibiotics from [facility] pharmacy 1 x Per Day/30 Days Discharge Instructions: HOLD Prim Dressing: Maxorb Extra Ag+ Alginate Dressing, 4x4.75 (in/in) 1 x Per Day/30 Days ary Discharge Instructions: Apply to wound bed as instructed Secondary Dressing: Drawtex 4x4 in 1 x Per Day/30 Days Discharge Instructions: Apply over primary dressing as directed. Secondary Dressing: Optifoam Non-Adhesive Dressing, 4x4 in (Generic) 1 x Per Day/30 Days Discharge Instructions: Apply over primary dressing as directed. Secondary Dressing: Woven Gauze Sponge, Non-Sterile 4x4 in (Generic) 1 x Per Day/30 Days Discharge Instructions: Apply  over primary dressing as directed. Secured With: 87M Medipore H Soft Cloth Surgical T ape, 4 x 10 (in/yd) (Generic) 1 x Per Day/30 Days Discharge Instructions: Secure with tape as directed. Com pression Wrap: Kerlix Roll 4.5x3.1 (in/yd) 1 x Per Day/30  Days Discharge Instructions: Apply Kerlix and Coban compression as directed. Add-Ons: [employer] Offloading Boot, Size Regular 1 x Per Day/30 Days 01/26/2023: He reports intense itching to the skin around the wound and there has been some breakdown here. It looks fungal in nature. The wound itself looks pretty good with improved epithelialization with just some slough buildup. He has a little callus along the wound edges. I used a curette to debride slough and callus from his wound. I am going to discontinue the periwound zinc oxide and apply ketoconazole to the periwound to address what seems to be a fungal issue. They can use an over-the-counter athlete's foot treatment at home. Continue topical gentamicin with silver alginate and drawtex to the wound. I am going to look into submitting his case to the [facility] for discussion. Follow-up in 1 week. Electronic Signature(s) Signed: 01/26/2023 12:11:04 PM By: [name] MD FACS Entered By: [name] on 01/26/2023 12:11:04 [name] ([id number]) [id number] Page 15 of 17 -------------------------------------------------------------------------------- HxROS Details Patient Name: Date of Service: [name] TO [name] 01/26/2023 11:00 A M Medical Record Number: [id number] Patient Account Number: [id number] Date of Birth/Sex: Treating RN: 1975-11-04 (47 y.o. M) Primary Care Provider: Dorinda Hill Other Clinician: Referring Provider: Treating Provider/Extender: Jana Hakim in Treatment: 132 Information Obtained From Patient Constitutional Symptoms (General Health) Medical History: Past Medical History Notes: COVID PNA  07/22/2019-11/14/2019 VENT ECMO, foot drop left foot , Eyes Medical History: Negative for: Cataracts; Glaucoma; Optic Neuritis Ear/Nose/Mouth/Throat Medical History: Negative for: Chronic sinus problems/congestion; Middle ear problems Hematologic/Lymphatic Medical History: Negative for: Anemia; Hemophilia; Human Immunodeficiency Virus; Lymphedema; Sickle Cell Disease Respiratory Medical History: Positive for: Asthma Negative for: Aspiration; Chronic Obstructive Pulmonary Disease (COPD); Pneumothorax; Sleep Apnea; Tuberculosis Cardiovascular Medical History: Positive for: Angina - with COVID; Hypertension Negative for: Arrhythmia; Congestive Heart Failure; Coronary Artery Disease; Deep Vein Thrombosis; Hypotension; Myocardial Infarction; Peripheral Arterial Disease; Peripheral Venous Disease; Phlebitis; Vasculitis Gastrointestinal Medical History: Negative for: Cirrhosis ; Colitis; Crohns; Hepatitis A; Hepatitis B; Hepatitis C Endocrine Medical History: Negative for: Type I Diabetes; Type II Diabetes Genitourinary Medical History: Negative for: End Stage Renal Disease Past Medical History Notes: kidney stone Immunological Medical History: Negative for: Lupus Erythematosus; Raynauds; Scleroderma Integumentary (Skin) Medical History: Negative for: History of Burn [name], WOJTASZEK ([id number]) [id number].pdf Page 16 of 17 Musculoskeletal Medical History: Negative for: Gout; Rheumatoid Arthritis; Osteoarthritis; Osteomyelitis Neurologic Medical History: Negative for: Dementia; Neuropathy; Quadriplegia; Paraplegia; Seizure Disorder Oncologic Medical History: Negative for: Received Chemotherapy; Received Radiation Psychiatric Medical History: Negative for: Anorexia/bulimia; Confinement Anxiety Past Medical History Notes: anxiety Immunizations Pneumococcal Vaccine: Received Pneumococcal Vaccination: No Implantable Devices None Hospitalization /  Surgery History Type of Hospitalization/Surgery COVID PNA 07/22/2019- 11/14/2019 03/27/2020 wound debridement/ skin graft Family and Social History Cancer: Yes - Maternal Grandparents; Diabetes: Yes - Father,Paternal Grandparents; Heart Disease: Yes - Maternal Grandparents; Hereditary Spherocytosis: No; Hypertension: Yes - Father,Paternal Grandparents; Kidney Disease: No; Lung Disease: Yes - Siblings; Seizures: No; Stroke: No; Thyroid Problems: No; Tuberculosis: No; Never smoker; Marital Status - Married; Alcohol Use: Never; Drug Use: No History; Caffeine Use: Daily - tea, soda; Financial Concerns: No; Food, Clothing or Shelter Needs: No; Support System Lacking: No; Transportation Concerns: No Electronic Signature(s) Signed: 01/26/2023 12:24:43 PM By: [name] MD FACS Entered By: [name] on 01/26/2023 12:02:24 -------------------------------------------------------------------------------- SuperBill Details Patient Name: Date of Service: [name], TO [name] 01/26/2023 Medical Record Number: [id number] Patient Account Number: [id number] Date of Birth/Sex: Treating RN: Sep 20, 1975 (  47 y.o. [name] Primary Care Provider: Dorinda Hill Other Clinician: Referring Provider: Treating Provider/Extender: Jana Hakim in Treatment: 132 Diagnosis Coding ICD-10 Codes Code Description [phone] Non-pressure chronic ulcer of other part of left foot with fat layer exposed Facility Procedures : BRYAR, DAHMS Code: [id number] REECE FEHNEL ([id number] Description: [phone] - DEBRIDE WOUND 1ST 20 SQ CM OR < ICD-10 Diagnosis Description L97.522 Non-pressure chronic ulcer of other part of left foot with fat layer exposed ) [phone] Modifier: [id number] Quantity: 1 27.pdf Page 17 of 17 Physician Procedures : CPT4 Code Description Modifier [id number] - WC PHYS LEVEL 4 - EST PT 25 ICD-10 Diagnosis Description L97.522 Non-pressure chronic ulcer of other part of  left foot with fat layer exposed Quantity: 1 : [phone] [id number] - WC PHYS DEBR WO ANESTH 20 SQ CM ICD-10 Diagnosis Description L97.522 Non-pressure chronic ulcer of other part of left foot with fat layer exposed Quantity: 1 Electronic Signature(s) Signed: 01/26/2023 12:11:27 PM By: [name] MD FACS Entered By: [name] on 01/26/2023 12:11:27

## 2023-01-27 NOTE — Progress Notes (Signed)
[name] ([id number]) [id number] Page 1 of 7 Visit Report for 01/26/2023 Arrival Information Details Patient Name: Date of Service: [name], TO [name] 01/26/2023 11:00 A M Medical Record Number: [id number] Patient Account Number: [id number] Date of Birth/Sex: Treating RN: 1975/11/04 (47 y.o. [name] Primary Care Carmencita Cusic: Dorinda Hill Other Clinician: Referring Lashala Laser: Treating Kelita Wallis/Extender: Jana Hakim in Treatment: 132 Visit Information History Since Last Visit All ordered tests and consults were completed: Yes Patient Arrived: Wheel Chair Added or deleted any medications: No Arrival Time: 11:20 Any new allergies or adverse reactions: No Accompanied By: spouse Had a fall or experienced change in No Transfer Assistance: None activities of daily living that may affect Patient Identification Verified: Yes risk of falls: Secondary Verification Process Completed: Yes Signs or symptoms of abuse/neglect since last visito No Patient Requires Transmission-Based Precautions: No Hospitalized since last visit: No Patient Has Alerts: No Implantable device outside of the clinic excluding No cellular tissue based products placed in the center since last visit: Has Dressing in Place as Prescribed: Yes Pain Present Now: No Electronic Signature(s) Signed: 01/27/2023 10:19:19 AM By: Brenton Grills Entered By: Brenton Grills on 01/26/2023 11:21:05 -------------------------------------------------------------------------------- Encounter Discharge Information Details Patient Name: Date of Service: [name], [name] 01/26/2023 11:00 A M Medical Record Number: [id number] Patient Account Number: [id number] Date of Birth/Sex: Treating RN: 1976/05/03 (47 y.o. [name] Primary Care [name]: Dorinda Hill Other Clinician: Referring [name]: Treating Belinda Schlichting/Extender: Jana Hakim in Treatment:  132 Encounter Discharge Information Items Post Procedure Vitals Discharge Condition: Stable Temperature (F): 98.9 Ambulatory Status: Wheelchair Pulse (bpm): 80 Discharge Destination: Home Respiratory Rate (breaths/min): 18 Transportation: Private Auto Blood Pressure (mmHg): 132/78 Accompanied By: spouse Schedule Follow-up Appointment: Yes Clinical Summary of Care: Patient Declined Electronic Signature(s) Signed: 01/27/2023 10:19:19 AM By: Brenton Grills Entered By: Brenton Grills on 01/26/2023 12:03:19 [name] ([id number]) [id number] Page 2 of 7 -------------------------------------------------------------------------------- Lower Extremity Assessment Details Patient Name: Date of Service: [name] [name] 01/26/2023 11:00 A M Medical Record Number: [id number] Patient Account Number: [id number] Date of Birth/Sex: Treating RN: May 23, 1976 (48 y.o. [name] Primary Care Danny Yackley: Dorinda Hill Other Clinician: Referring Square Jowett: Treating Kevion Fatheree/Extender: Jana Hakim in Treatment: 132 Edema Assessment Assessed: [name]: No] [name]: No] Edema: [Left: N] [Right: o] Calf Left: Right: Point of Measurement: 29 cm From Medial Instep 42.8 cm Ankle Left: Right: Point of Measurement: 9 cm From Medial Instep 24.7 cm Vascular Assessment Pulses: Dorsalis Pedis Palpable: [Left:Yes] Electronic Signature(s) Signed: 01/27/2023 10:19:19 AM By: Brenton Grills Entered By: Brenton Grills on 01/26/2023 11:25:24 -------------------------------------------------------------------------------- Multi Wound Chart Details Patient Name: Date of Service: [name], [name] 01/26/2023 11:00 A M Medical Record Number: [id number] Patient Account Number: [id number] Date of Birth/Sex: Treating RN: 1976/05/05 (47 y.o. M) Primary Care [name]: Dorinda Hill Other Clinician: Referring [name]: Treating [name]/Extender: Jana Hakim in Treatment: 132 Vital Signs Height(in): 69 Pulse(bpm): 84 Weight(lbs): 280 Blood Pressure(mmHg): 133/81 Body Mass Index(BMI): 41.3 Temperature(F): 98.9 Respiratory Rate(breaths/min): 18 [2:Photos:] [N/A:N/A] Left Calcaneus N/A N/A Wound Location: Pressure Injury N/A N/A Wounding Event: Pressure Ulcer N/A N/A Primary Etiology: Asthma, Angina, Hypertension N/A N/A Comorbid History: 10/07/2019 N/A N/A Date Acquired: 132 N/A N/A Weeks of Treatment: Open N/A N/A Wound Status: No N/A N/A Wound Recurrence: 3.4x1x0.3 N/A N/A Measurements L x W x D (cm) 2.67 N/A N/A A (cm) : rea 0.801 N/A N/A Volume (cm) : 90.10% N/A  N/A % Reduction in A rea: 97.00% N/A N/A % Reduction in Volume: Category/Stage III N/A N/A Classification: Medium N/A N/A Exudate A mount: Serosanguineous N/A N/A Exudate Type: red, brown N/A N/A Exudate Color: Distinct, outline attached N/A N/A Wound Margin: Medium (34-66%) N/A N/A Granulation A mount: Red, Pink N/A N/A Granulation Quality: Medium (34-66%) N/A N/A Necrotic A mount: Fat Layer (Subcutaneous Tissue): Yes N/A N/A Exposed Structures: Fascia: No Tendon: No Muscle: No Joint: No Bone: No Debridement - Selective/Open Wound N/A N/A Debridement: Pre-procedure Verification/Time Out 11:40 N/A N/A Taken: Lidocaine 5% topical ointment N/A N/A Pain Control: Slough N/A N/A Tissue Debrided: Non-Viable Tissue N/A N/A Level: 2.67 N/A N/A Debridement A (sq cm): rea Curette N/A N/A Instrument: Minimum N/A N/A Bleeding: Pressure N/A N/A Hemostasis A chieved: 0 N/A N/A Procedural Pain: 0 N/A N/A Post Procedural Pain: Procedure was tolerated well N/A N/A Debridement Treatment Response: 3.4x1x0.3 N/A N/A Post Debridement Measurements L x W x D (cm) 0.801 N/A N/A Post Debridement Volume: (cm) Category/Stage III N/A N/A Post Debridement Stage: Excoriation: No N/A N/A Periwound Skin Texture: Induration: No Callus:  No Crepitus: No Rash: No Scarring: No Dry/Scaly: Yes N/A N/A Periwound Skin Moisture: Maceration: No Hemosiderin Staining: Yes N/A N/A Periwound Skin Color: Atrophie Blanche: No Cyanosis: No Ecchymosis: No Erythema: No Mottled: No Pallor: No Rubor: No No Abnormality N/A N/A Temperature: Debridement N/A N/A Procedures Performed: Treatment Notes Electronic Signature(s) Signed: 01/26/2023 11:56:57 AM By: [name] MD FACS Entered By: [name] on 01/26/2023 11:56:56 -------------------------------------------------------------------------------- Multi-Disciplinary Care Plan Details Patient Name: Date of Service: [name], [name] 01/26/2023 11:00 A M Medical Record Number: [id number] Patient Account Number: [id number] Date of Birth/Sex: Treating RN: 10/26/1975 (47 y.o. Dyllen, Menning, Henrietta Dine ([id number]) [id number] Page 4 of 7 Primary Care Denicia Pagliarulo: Dorinda Hill Other Clinician: Referring Machele Deihl: Treating Kilani Joffe/Extender: Jana Hakim in Treatment: 132 Multidisciplinary Care Plan reviewed with physician Active Inactive Wound/Skin Impairment Nursing Diagnoses: Impaired tissue integrity Knowledge deficit related to ulceration/compromised skin integrity Goals: Patient/caregiver will verbalize understanding of skin care regimen Date Initiated: 07/15/2020 Target Resolution Date: 05/08/2023 Goal Status: Active Ulcer/skin breakdown will have a volume reduction of 30% by week 4 Date Initiated: 07/15/2020 Date Inactivated: 08/20/2020 Target Resolution Date: 09/03/2020 Goal Status: Unmet Unmet Reason: no major changes. Ulcer/skin breakdown will heal within 14 weeks Date Initiated: 12/04/2020 Date Inactivated: 12/10/2020 Target Resolution Date: 12/10/2020 Unmet Reason: wounds still open at 14 Goal Status: Unmet weeks and today 21 weeks. Interventions: Assess patient/caregiver ability to obtain necessary  supplies Assess patient/caregiver ability to perform ulcer/skin care regimen upon admission and as needed Assess ulceration(s) every visit Provide education on ulcer and skin care Treatment Activities: Skin care regimen initiated : 07/15/2020 Topical wound management initiated : 07/15/2020 Notes: Electronic Signature(s) Signed: 01/27/2023 10:19:19 AM By: Brenton Grills Entered By: Brenton Grills on 01/26/2023 11:31:34 -------------------------------------------------------------------------------- Pain Assessment Details Patient Name: Date of Service: [name], TO [name] 01/26/2023 11:00 A M Medical Record Number: [id number] Patient Account Number: [id number] Date of Birth/Sex: Treating RN: 04-20-1976 (47 y.o. [name] Primary Care Kristeen Lantz: Dorinda Hill Other Clinician: Referring Aleksandar Duve: Treating Gracyn Allor/Extender: Jana Hakim in Treatment: 132 Active Problems Location of Pain Severity and Description of Pain Patient Has Paino No Site Locations [place] ([id number]) [id number] Page 5 of 7 Pain Management and Medication Current Pain Management: Electronic Signature(s) Signed: 01/27/2023 10:19:19 AM By: Brenton Grills Entered By: Brenton Grills on 01/26/2023 11:21:39 -------------------------------------------------------------------------------- Patient/Caregiver  Education Details Patient Name: Date of Service: [name] [place] 6/20/2024andnbsp11:00 A M Medical Record Number: [id number] Patient Account Number: [id number] Date of Birth/Gender: Treating RN: 1975/08/14 (47 y.o. [name] Primary Care Physician: Dorinda Hill Other Clinician: Referring Physician: Treating Physician/Extender: Jana Hakim in Treatment: 132 Education Assessment Education Provided To: Patient and Caregiver Education Topics Provided Wound/Skin Impairment: Methods: Explain/Verbal Responses: State content  correctly Electronic Signature(s) Signed: 01/27/2023 10:19:19 AM By: Brenton Grills Entered By: Brenton Grills on 01/26/2023 11:31:56 -------------------------------------------------------------------------------- Wound Assessment Details Patient Name: Date of Service: [name], TO [name] 01/26/2023 11:00 A M Medical Record Number: [id number] Patient Account Number: [id number] Date of Birth/Sex: Treating RN: [birth year] (47 y.o. [name] Primary Care Dennette Faulconer: Dorinda Hill Other Clinician: Referring Trent Gabler: Treating Jasalyn Frysinger/Extender: [place], Stupka ([id number]) [id number] Page 6 of 7 Weeks in Treatment: 132 Wound Status Wound Number: 2 Primary Etiology: Pressure Ulcer Wound Location: Left Calcaneus Wound Status: Open Wounding Event: Pressure Injury Comorbid History: Asthma, Angina, Hypertension Date Acquired: 10/07/2019 Weeks Of Treatment: 132 Clustered Wound: No Photos Wound Measurements Length: (cm) 3.4 Width: (cm) 1 Depth: (cm) 0.3 Area: (cm) 2.67 Volume: (cm) 0.801 % Reduction in Area: 90.1% % Reduction in Volume: 97% Tunneling: No Undermining: No Wound Description Classification: Category/Stage III Wound Margin: Distinct, outline attached Exudate Amount: Medium Exudate Type: Serosanguineous Exudate Color: red, brown Foul Odor After Cleansing: No Slough/Fibrino No Wound Bed Granulation Amount: Medium (34-66%) Exposed Structure Granulation Quality: Red, Pink Fascia Exposed: No Necrotic Amount: Medium (34-66%) Fat Layer (Subcutaneous Tissue) Exposed: Yes Necrotic Quality: Adherent Slough Tendon Exposed: No Muscle Exposed: No Joint Exposed: No Bone Exposed: No Periwound Skin Texture Texture Color No Abnormalities Noted: No No Abnormalities Noted: No Callus: No Atrophie Blanche: No Crepitus: No Cyanosis: No Excoriation: No Ecchymosis: No Induration: No Erythema: No Rash: No Hemosiderin  Staining: Yes Scarring: No Mottled: No Pallor: No Moisture Rubor: No No Abnormalities Noted: No Dry / Scaly: Yes Temperature / Pain Maceration: No Temperature: No Abnormality Treatment Notes Wound #2 (Calcaneus) Wound Laterality: Left Cleanser Normal Saline Discharge Instruction: Cleanse the wound with Normal Saline prior to applying a clean dressing using gauze sponges, not tissue or cotton balls. Wound Cleanser Discharge Instruction: Cleanse the wound with wound cleanser prior to applying a clean dressing using gauze sponges, not tissue or cotton balls. Peri-Wound Care Ketoconazole Cream 2% NICOLE, HAFLEY ([id number]) [id number] Page 7 of 7 Discharge Instruction: Apply Ketoconazole as directed Topical Gentamicin compounding topical antibiotics from [facility] pharmacy Discharge Instruction: HOLD Primary Dressing Maxorb Extra Ag+ Alginate Dressing, 4x4.75 (in/in) Discharge Instruction: Apply to wound bed as instructed Secondary Dressing Drawtex 4x4 in Discharge Instruction: Apply over primary dressing as directed. Optifoam Non-Adhesive Dressing, 4x4 in Discharge Instruction: Apply over primary dressing as directed. Woven Gauze Sponge, Non-Sterile 4x4 in Discharge Instruction: Apply over primary dressing as directed. Secured With 18M Medipore H Soft Cloth Surgical T ape, 4 x 10 (in/yd) Discharge Instruction: Secure with tape as directed. Compression Wrap Kerlix Roll 4.5x3.1 (in/yd) Discharge Instruction: Apply Kerlix and Coban compression as directed. Compression Stockings Add-Ons [place] Vascular Offloading Boot, Size Regular Electronic Signature(s) Signed: 01/27/2023 10:19:19 AM By: Brenton Grills Entered By: Brenton Grills on 01/26/2023 11:30:50 -------------------------------------------------------------------------------- Vitals Details Patient Name: Date of Service: [name], [name] 01/26/2023 11:00 A M Medical Record Number:  [id number] Patient Account Number: [id number] Date of Birth/Sex: Treating RN: Dec 31, 1975 (47 y.o. [name] Primary Care Imoni Kohen: Dorinda Hill Other Clinician:  Referring Jalicia Roszak: Treating Cordaro Mukai/Extender: Jana Hakim in Treatment: 132 Vital Signs Time Taken: 11:21 Temperature (F): 98.9 Height (in): 69 Pulse (bpm): 84 Weight (lbs): 280 Respiratory Rate (breaths/min): 18 Body Mass Index (BMI): 41.3 Blood Pressure (mmHg): 133/81 Reference Range: 80 - 120 mg / dl Electronic Signature(s) Signed: 01/27/2023 10:19:19 AM By: Brenton Grills Entered By: Brenton Grills on 01/26/2023 11:21:33

## 2023-01-27 NOTE — Patient Instructions (Addendum)
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 Occurred 12/20.  Also has L foot drop. Still w/c dependent- still cannot put weight on feet per Plastics.  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   2. Cover wound and spray antiperspirant on the foot to help with sweating. Can always use stick if spray gets near wound. Don't get in wound.   3.  Need to change emotional eating- because having surgery won't fix your eating habits.   4.  Can take up to Benadryl 75 mg nightly for itching. That's 3 pills-   Sounds like switched zinc oxide- which was using to dry it up- too much.   5.  We discussed how to not cook, but keep diet more balanced- eat something good for you- FIRST and then only choose good food to eat first, then less room for junk food.   6.  We discussed that I really think surgery/skin graft- is the best choice, so has an end in sight- doesn't have end in sight right now. But per pt, Plastics/Podiatry  told him no skin graft? I think a second opinion for graft is worthwhile.   7. We discussed ways to work on weight- esp eating food good for him first.   8. F/U in 3months- double appt- ICU myopathy.

## 2023-02-02 ENCOUNTER — Encounter (HOSPITAL_BASED_OUTPATIENT_CLINIC_OR_DEPARTMENT_OTHER): Payer: PPO | Admitting: General Surgery

## 2023-02-02 DIAGNOSIS — L89623 Pressure ulcer of left heel, stage 3: Secondary | ICD-10-CM | POA: Diagnosis not present

## 2023-02-02 DIAGNOSIS — L97522 Non-pressure chronic ulcer of other part of left foot with fat layer exposed: Secondary | ICD-10-CM | POA: Diagnosis not present

## 2023-02-02 NOTE — Progress Notes (Signed)
ATA, PECHA ([id number]) [id number] Page 1 of 17 Visit Report for 02/02/2023 Chief Complaint Document Details Patient Name: Date of Service: [name], [name] [name] 02/02/2023 10:15 A M Medical Record Number: [id number] Patient Account Number: [id number] Date of Birth/Sex: Treating RN: May 19, 1976 (47 y.o. M) Primary Care Provider: Dorinda Hill Other Clinician: Referring Provider: Treating Provider/Extender: Jana Hakim in Treatment: 133 Information Obtained from: Patient Chief Complaint Bilateral Plantar Foot Ulcers Electronic Signature(s) Signed: 02/02/2023 10:54:09 AM By: [name] MD FACS Entered By: [name] on 02/02/2023 10:54:09 -------------------------------------------------------------------------------- Debridement Details Patient Name: Date of Service: [name], [name] [place] E. 02/02/2023 10:15 A M Medical Record Number: [id number] Patient Account Number: [id number] Date of Birth/Sex: Treating RN: 12-19-1975 (47 y.o. M) Primary Care Provider: Dorinda Hill Other Clinician: Referring Provider: Treating Provider/Extender: Jana Hakim in Treatment: 133 Debridement Performed for Assessment: Wound #2 Left Calcaneus Performed By: Physician [name], MD Debridement Type: Debridement Level of Consciousness (Pre-procedure): Awake and Alert Pre-procedure Verification/Time Out Yes - 10:26 Taken: Start Time: 10:28 Pain Control: Lidocaine 4% T opical Solution Percent of Wound Bed Debrided: 100% T Area Debrided (cm): otal 2.33 Tissue and other material debrided: Viable, Non-Viable, Slough, Skin: Dermis , Skin: Epidermis, Slough Level: Skin/Epidermis Debridement Description: Selective/Open Wound Instrument: Curette Bleeding: Minimum Hemostasis Achieved: Pressure End Time: 10:30 Procedural Pain: 0 Post Procedural Pain: 0 Response [name] Treatment: Procedure was tolerated well Level of  Consciousness (Post- Awake and Alert procedure): Post Debridement Measurements of Total Wound Length: (cm) 3.3 Stage: Category/Stage III Width: (cm) 0.9 Depth: (cm) 0.2 Volume: (cm) 0.467 Character of Wound/Ulcer Post Debridement: Improved [name], [name] ([id number]) [id number] Page 2 of 17 Post Procedure Diagnosis Same as Pre-procedure Notes Scribed for Dr Lady Gary by Brenton Grills RN. Electronic Signature(s) Signed: 02/02/2023 10:53:59 AM By: [name] MD FACS Entered By: [name] on 02/02/2023 10:53:59 -------------------------------------------------------------------------------- HPI Details Patient Name: Date of Service: [name], [name] [place] E. 02/02/2023 10:15 A M Medical Record Number: [id number] Patient Account Number: [id number] Date of Birth/Sex: Treating RN: 1975-09-30 (47 y.o. M) Primary Care Provider: Dorinda Hill Other Clinician: Referring Provider: Treating Provider/Extender: Jana Hakim in Treatment: 133 History of Present Illness HPI Description: Wounds are12/03/2020 upon evaluation today patient presents for initial inspection here in our clinic concerning issues he has been having with the bottoms of his feet bilaterally. He states these actually occurred as wounds when he was hospitalized for 5 months secondary [name] Covid. He was apparently with tilting bed where he was in an upright position quite frequently and apparently this occurred in some way shape or form during that time. Fortunately there is no sign of active infection at this time. No fevers, chills, nausea, vomiting, or diarrhea. With that being said he still has substantial wounds on the plantar aspects of his feet Theragen require quite a bit of work [name] get these [name] heal. He has been using Santyl currently though that is been problematic both in receiving the medication as well as actually paid for it as it is become quite expensive. Prior [name] the  experience with Covid the patient really did not have any major medical problems other than hypertension he does have some mild generalized weakness following the Covid experience. 07/22/2020 on evaluation today patient appears [name] be doing okay in regard [name] his foot ulcers I feel like the wound beds are showing signs of better improvement that I do believe the Iodoflex is helping in this regard. With  that being said he does have a lot of drainage currently and this is somewhat blue/green in nature which is consistent with Pseudomonas. I do think a culture today would be appropriate for [place] [name] evaluate and see if that is indeed the case I would likely start him on antibiotic orally as well he is not allergic [name] Cipro knows of no issues he has had in the past 12/21; patient was admitted [name] the clinic earlier this month with bilateral presumed pressure ulcers on the bottom of his feet apparently related [name] excessive pressure from a tilt table arrangement in the intensive care unit. Patient relates this [name] being on ECMO but I am not really sure that is exactly related [name] that. I must say I have never seen anything like this. He has fairly extensive full-thickness wounds extending from his heel towards his midfoot mostly centered laterally. There is already been some healing distally. He does not appear [name] have an arterial issue. He has been using gentamicin [name] the wound surfaces with Iodoflex [name] help with ongoing debridement 1/6; this is a patient with pressure ulcers on the bottom of his feet related [name] excessive pressure from a standing position in the intensive care unit. He is complaining of a lot of pain in the right heel. He is not a diabetic. 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 We have been using Iodoflex [name] help prepare the surfaces of both wounds for an advanced treatment product. He is nonambulatory spending most of his time in a wheelchair I have asked him not [name]  propel the wheelchair with his heels 1/13; in general his wounds look better not much surface area change we have been using Iodoflex as of last week. I did an x-ray of the right heel as the patient was complaining of pain. I had some thoughts about a stress fracture perhaps Achilles tendon problems however what it showed was erosive changes along the inferior aspect of the calcaneus he now has a MRI booked for 1/20. 1/20; in general his wounds continue [name] be better. Some improvement in the large narrow areas proximally in his foot. He is still complaining of pain in the right heel and tenderness in certain areas of this wound. His MRI is tonight. I am not just looking for osteomyelitis that was brought up on the x-ray I am wondering about stress fractures, tendon ruptures etc. He has no such findings on the left. Also noteworthy is that the patient had critical illness neuropathy and some of the discomfort may be actual improvement in nerve function I am just not sure. These wounds were initially in the setting of severe critical illness related [name] COVID-19. He was put in a standing position. He may have also been on pressors at the point contributing [name] tissue ischemia. By his description at some point these wounds were grossly necrotic extending proximally up into the Achilles part of his heel. I do not know that I have ever really seen pictures of them like this although they may exist in epic We have ordered Tri layer Oasis. I am trying [name] stimulate some granulation in these areas. This is of course assuming the MRI is negative for infection 1/27; since the patient was last here he saw Dr. Earlene Plater of infectious disease. He is planned for vancomycin and ceftriaxone. Prior operative culture grew MSSA. Also ordered baseline lab work. He also ordered arterial studies although the ABIs in our clinic were normal as well as  his clinical exam these were normal I do not think he needs [name] see vascular  surgery. His ABIs at the [facility] were 1.22 in the right triphasic waveforms with a normal TBI of 1.15 on the left ABI of 1.22 with triphasic waveforms and a normal TBI of 1.08. Finally he saw Dr. Logan Bores who will follow him in for 2 months. At this point I do not think he felt that he needed a procedure on the right calcaneal bone. Dr. Earlene Plater is elected for broad-spectrum antibiotic The patient is still having pain in the right heel. He walks with a walker 2/3; wounds are generally smaller. He is tolerating his IV antibiotics. I believe this is vancomycin and ceftriaxone. We are still waiting for Oasis burn in terms of his out-of-pocket max which he should be meeting soon given the IV antibiotics, MRIs etc. I have asked him [name] check in on this. We are using silver collagen in the meantime the wounds look better 2/10; tolerating IV vancomycin and Rocephin. We are waiting [name] apply for Oasis. Although I am not really sure where he is in his out-of-pocket max. GIANN, OBARA ([id number]) [id number] Page 3 of 17 2/17 started the first application of Oasis trilayer. Still on antibiotics. The wounds have generally look better. The area on the left has a little more surface slough requiring debridement 2/24; second application of Oasis trilayer. The wound surface granulation is generally look better. The area on the left with undermining laterally I think is come in a bit. 10/08/2020 upon evaluation today patient is here today for [employer] application #3. Fortunately he seems [name] be doing extremely well with regard [name] this and we are seeing a lot of new epithelial growth which is great news. Fortunately there is no signs of active infection at this time. 10/16/2020 upon evaluation today patient appears [name] be doing well with regard [name] his foot ulcers. Do believe the Oasis has been of benefit for him. I do not see any signs of infection right now which is great news and I think that  he has a lot of new epithelial growth which is great [name] see as well. The patient is very pleased [name] hear all of this. I do think we can proceed with the Oasis trilayer #4 today. 3/18; not as much improvement in these areas on his heels that I was hoping. I did reapply trilateral Oasis today the tissue looks healthier but not as much fill in as I was hoping. 3/25; better looking today I think this is come in a bit the tissue looks healthier. Triple layer Oasis reapplied #6 4/1; somewhat better looking definitely better looking surface not as much change in surface area as I was hoping. He may be spending more time [place] on days then he needs [name] although he does have heel offloading boots. Triple layer Oasis reapplied #7 4/7; unfortunately apparently [facility] [place] will not approve any further Oasis which is unfortunate since the patient did respond nicely both in terms of the condition of the wound bed as well as surface area. There is still some drainage coming from the wound but not a lot there does not appear [name] be any infection 4/15; we have been using Hydrofera Blue. He continues [name] have nice rims of epithelialization on the right greater than the left. The left the epithelialization is coming from the tip of his heel. There is moderate drainage. In this that concerns me about a total contact cast.  There is no evidence of infection 4/29; patient has been using Hydrofera Blue with dressing changes. He has no complaints or issues today. 5/5; using Hydrofera Blue. I actually think that he looks marginally better than the last time I saw this 3 weeks ago. There are rims of epithelialization on the left thumb coming from the medial side on the right. Using Hydrofera Blue 5/12; using Hydrofera Blue. These continue [name] make improvements in surface area. His drainage was not listed as severe I therefore went ahead and put a cast on the left foot. Right foot we will continue [name] dress his  previous 5/16; back for first total contact cast change. He did not tolerate this particularly well cast injury on the anterior tibia among other issues. Difficulty sleeping. I talked him about this in some detail and afterwards is elected [name] continue. I told him I would like [name] have a cast on for 3 weeks [name] see if this is going [name] help at all. I think he agreed 5/19; I think the wound is better. There is no tunneling towards his midfoot. The undermining medially also looks better. He has a rim of new skin distally. I think we are making progress here. The area on the left also continues [name] look somewhat better [name] me using Hydrofera Blue. He has a list of complaints about the cast but none of them seem serious 5/26; patient presents for 1 week follow-up. He has been using a total contact cast and tolerating this well. Hydrofera Blue is the main dressing used. He denies signs of infection. 6/2 Hydrofera Blue total contact cast on the left. These were large ulcers that formed in intensive care unit where the patient was recovering from COVID. May have had something to do with being ventilated in an upright positiono Pressors etc. We have been able [name] get the areas down considerably and a viable surface. There is some epithelialization in both sides. Note made of drainage 6/9; changed [name] [facility] last time because of drainage. He arrives with better looking surfaces and dimensions on the left than the right. Paradoxically the right actually probes more towards his midfoot the left is largely close down but both of these look improved. Using a total contact cast on the left 6/16; complex wounds on his bilateral plantar heels which were initially pressure injury from a stay in the ICU with COVID. We have been using silver alginate most recently. His dimensions of come in quite dramatically however not recently. We have been putting the left foot in a total contact cast 6/23; complex wounds on the  bilateral plantar heels. I been putting the left in the cast paradoxically the area on the right is the one that is going towards closure at a faster rate. Quite a bit of drainage on the left. The patient went [name] see Dr. Logan Bores who said he was going [name] standby for skin grafts. I had actually considered sending him for skin grafts however he would be mandatorily off his feet for a period of weeks [name] months. I am thinking that the area on the right is going [name] close on its own the area on the left has been more stubborn even though we have him in a total contact cast 6/30; took him out of a total contact cast last week is the right heel seem [name] be making better progress than the left where I was placing the cast. We are using silver alginate. Both wounds are smaller right  greater than left 7/12; both wounds look as though they are making some progress. We are using silver alginate. Heel offloading boots 7/26; very gradual progress especially on the right. Using silver alginate. He is wearing heel offloading boots 8/18; he continues [name] close these wounds down very gradually. Using silver alginate. The problem polymen being definitive about this is areas of what appears [name] be callus around the margins. This is not a 100% of the area but certainly sizable especially on the right 9/1; bilateral plantar feet wounds secondary [name] prolonged pressure while being ventilated for COVID-19 in an upright position. Essentially pressure ulcers on the bottom of his feet. He is made substantial progress using silver alginate. 9/14; bilateral plantar feet wounds secondary [name] prolonged pressure. Making progress using silver alginate. 9/29 bilateral plantar feet wounds secondary [name] prolonged pressure. I changed him [name] Iodoflex last week. MolecuLight showing reddened blush fluorescence 10/11; patient presents for follow-up. He has no issues or complaints today. He denies signs of infection. He continues [name] use Iodoflex and  antibiotic ointment [name] the wound beds. 10/27; 2-week follow-up. No evidence of infection. He has callus and thick dry skin around the wound margins we have been using Iodoflex and Bactroban which was in response [name] a moderate left MolecuLight reddish blush fluorescence. 11/10; 2-week follow-up. Wound margins again have thick callus however the measurements of the actual wound sites are a lot smaller. Everything looks reasonably healthy here. We have been using Iodoflex He was approved for prime matrix but I have elected [name] delay this given the improvement in the surface area. Hopefully I will not regret that decision as were getting close [name] the end of the year in terms of insurance payment 12/8; 2-week follow-up. Wounds are generally smaller in size. These were initially substantial wounds extending into the forefoot all the way into the heel on the bilateral plantar feet. They are now both located on the plantar heel distal aspect both of these have a lot of callus around the wounds I used a #5 curette [name] remove this on the right and the left also some subcutaneous debris [name] try and get the wound edges were using Iodoflex. He has heel offloading shoe 12/22; 2-week follow-up. Not really much improvement. He has thick callus around the outer edges of both wounds. I remove this there is some nonviable subcutaneous tissue as well. We have been using Iodoflex. Her intake nurse and myself spontaneously thought of a total contact cast I went back in May. At that time we really were not seeing much of an improvement with a cast although the wound was in a much different situation I would like [name] retry this in 2 weeks and I discussed this with the patient 08/12/2021; the patient has had some improvement with the Iodoflex. The the area on the left heel plantar more improved than the right. I had [name] put him in a total contact cast on the left although I decided [name] put that off for 2 weeks. I am going [name] change  his primary dressing [name] silver collagen. I think in both areas he has had some improvement most of the healing seems [name] be more proximal in the heel. The wounds are in the mid aspect. A lot of thick callus on the right heel however. 1/19; we are using silver collagen on both plantar heel areas. He has had some improvement today. The left did not require any debridement. He still had some eschar on the right that  was debrided but both seem [name] have contracted. I did not put it total contact cast on him today 2/2 we have been using silver collagen. The area on the right plantar heel has areas that appear [name] be epithelialized interspersed with dry flaking callus and dry skin. I removed this. This really looks better than on the other side. On the left still a large area with raised edges and debris on the surface. 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 He has no issues or complaints today. 2/9; not much change in the left foot wound with 1 week of a cast we are using silver collagen. Silver collagen on the right side. The right side has been the better wound surface. We will reapply the total contact cast on the left 2/16; not much improvement on either side I been using silver collagen with a total contact cast on the left. I'm changing the [facility] still with a total KAMSIYOCHUKWU, SPICKLER ([id number]) [phone].pdf Page 4 of 17 contact cast on the left 2/23; some improvement on both sides. Disappointing that he has thick callus around the area that we are putting in a total contact cast on the left. We've been using Hydrofera Blue on both wound areas. This is a man who at essentially pressure ulcers in addition [name] ischemia caused by medications [name] support his blood pressure (pressors) in the ICU. He was being ventilated in the standing  position for severe Covid. A Shiley the wounds extended across his entire foot but are now localized [name] his plantar heels bilaterally. We have made progress however neither areas healed. I continue [name] think the total contact cast is helped albeit painstakingly slowly. He has never wanted a plastic surgery consult although I don't know that they would be interested in grafting in area in this location. 10/07/2021: Continued improvement bilaterally. There is still some callus around the left wound, despite the total contact cast. He has some increased pain in his right midfoot around 1 particular area. This has been painful in the past but seems [name] be a little bit worse. When his cast was removed today, there was an area on the heel of the left foot that looks a bit macerated. He is also complaining of pain in his left thigh and hip which he thinks is secondary [name] the limb length discrepancy caused by the cast. 10/14/2021: He continues [name] improve. A little bit less callus around the left wound. He continues [name] endorse pain in his right midfoot, but this is not as significant as it was last week. The maceration on his left heel is improved. 10/21/2021: Continued improvement [name] both wounds. The maceration on his left heel is no longer evident. Less callus bilaterally. Epithelialization progressing. 10/28/2021: Significant improvement this week. The right sided wound is nearly closed with just a small open area at the middle. No maceration seen on the left heel. Continued epithelialization on both sides. No concern for infection. 11/04/2021: T oday, the wounds were measured a little bit differently and come out as larger, but I actually think they are about the same [name] potentially even smaller, particularly on the left. He continues [name] accumulate some callus on the right. 11/11/2021: T oday, the patient is expressing some concern that the left wound, despite being in the total contact cast, is not progressing at the  same  rate as the 1 on the right. He is interested in trying a week without the cast [name] see how the wound does. The wounds are roughly the same size as last week, with the right perhaps being a little bit smaller. He continues [name] build up callus on both sites. 11/18/2021: Last week, I permitted the patient [name] [name] without his total contact cast, just [name] see if the cast was really making any difference. Today, both wounds have deteriorated [name] some extent, suggesting that the cast is providing benefit, at least on the left. Both are larger and have accumulated callus, slough, and other debris. 11/26/2021: I debrided both wounds quite aggressively last week in an effort [name] stimulate the healing cascade. This appears [name] have been effective as the left sided wound is a full centimeter shorter in length. Although the right was measured slightly larger, on inspection, it looks as though an area of epithelialized tissue was included in the measurements. We have been using PolyMem Ag on the wound surfaces with a total contact cast [name] the left. 12/02/2021: It appears that the intake personnel are including epithelialized tissue in his wound measurements; the right wound is almost completely epithelialized; there is just a crater at the proximal midfoot with some open areas. On the left, he has built up some callus, but the overall wound surface looks good. There is some senescent skin around the wound margin. He has been in PolyMem Ag bilaterally with a total contact cast on the left. 12/09/2021: The right wound is nearly closed; there is just a small open area at the mid calcaneus. On the left, the wound is smaller with minimal callus buildup. No significant drainage. 12/16/2021: The right calcaneal wound remains minimally open at the mid calcaneus; the rest has epithelialized. On the left, the wound is also a little bit smaller. There is some senescent tissue on the periphery. He is getting his first application of a  trial skin substitute called Vendaje today. 12/23/2021: The wound on his right calcaneus is nearly closed; there is just a small area at the most distal aspect of the calcaneus that is open. On the left, the area where we applied [name] the skin substitute has a healthier-looking bed of granulation tissue. The wound dimensions are not significantly different on this side but the wound surface is improved. 12/30/2021: The wound on the right calcaneus has not changed significantly aside from some accumulation of callus. On the left, the open area is smaller and continues [name] have an improved surface. He continues [name] accumulate callus around the wound. He is here for his third application of Vendaje. 01/06/2022: The right calcaneal wound is down [name] just a couple of millimeters. He continues [name] accumulate periwound callus. He unfortunately got his cast wet earlier in the week and his left foot is macerated, resulting in some superficial skin loss just distal [name] the open wound. The open wound itself, however, is much smaller and has a healthier appearing surface. He is here for his fourth application of Vendaje. 01/13/2022: The right calcaneal wound is about the same. Unfortunately, once again, his cast got wet and his foot is again macerated. This is caused the left calcaneal wound [name] enlarge. He is here for his fifth application of Vendaje. 01/20/2022: The right calcaneal wound is very small. There is some periwound callus accumulation. He purchased a new cast protector last week and this has been effective in avoiding the maceration that has been occurring on the left. The  left calcaneal wound is narrower and has a healthy and viable-appearing surface. He is here for his 6 application of Vendaje. 01/27/2022: The right calcaneal wound is down [name] just a pinhole. There is some periwound slough and callus. On the left, the wound is narrower and shorter by about a centimeter. The surface is robust and viable-appearing.  Unfortunately, the rep for the trial skin substitute product did not provide any for [place] [name] use today. 02/04/2022: The right calcaneal wound remains unchanged. There is more accumulated callus. On the left, although the intake nurse measured it a little bit longer, it looks about the same [name] me. The surface has a layer of slough, but underneath this, there is good granulation tissue. 02/10/2022: The right calcaneus wound is nearly closed. There is still some callus that builds up around the site. The left side looks about the same in terms of dimensions, but the surface is more robust and vital-appearing. 02/16/2022: The area of the right calcaneus that was nearly closed last week has closed, but there is a small opening at the mid foot where it looks like some moisture got retained and caused some reopening. The left foot wound is narrower and shallower. Both sites have a fair amount of periwound callus and eschar. 02/24/2022: The small midfoot opening on the right calcaneus is a little bit smaller today. The left foot wound is narrower and shallower. He continues [name] accumulate periwound callus. No concern for infection. 03/01/2022: The patient came [name] clinic early because he showered and got his cast wet. Fortunately, there is no significant maceration [name] his foot but the callus softened and it looks like the wound on his left calcaneus may be a little bit wider. The wound on his right calcaneus is just a narrow slit. Continued accumulation of periwound callus bilaterally. 03/08/2022: The wound on his right calcaneus is very nearly closed, just a small pinpoint opening under a bit of eschar; the left wound has come in quite a bit, as well. It is narrower and shorter than at our last visit. Still with accumulated callus and eschar bilaterally. 03/17/2022: The right calcaneal wound is healed. The left wound is smaller and the surface itself is very clean, but there is some blue-green staining on the periwound  callus, concerning for Pseudomonas aeruginosa. 03/23/2022: The right calcaneal wound remains closed. The left wound continues [name] contract. No further blue-green staining. Small amount of callus and slough [name], [name] ([id number]) [phone].pdf Page 5 of 17 accumulation. 03/28/2022: He came in early today because he had gotten his cast wet. On inspection, the wound itself did not get wet or macerated, just a little bit of the forefoot. The wound itself is basically unchanged. 04/07/2022: The right foot wound remains closed. The left wound is the smallest that I have seen it [name] date. It is narrower and shorter. It still continues [name] accumulate slough on the surface. 04/15/2022: There is a band of epithelium now dividing the small left plantar foot wound in 2. There is still some slough on the surface. 04/21/2022: The wound continues [name] narrow. Just a little bit of slough on the surface. He seems [name] be responding well [name] endoform. 04/28/2022: Continued slow contraction of the wound. There is a little slough on the surface and some periwound callus. We have been using endoform and total contact cast. 05/05/2022: The wound appears [name] have stalled. There is slough and some periwound eschar/callus. No concern for infection, however. 05/12/2022: Unfortunately, his right foot has  reopened. It is located at the most posterior aspect of his surgical incision. The area was noted [name] have drainage coming from it when his padding was removed today. Underneath some callus and senescent skin, there is an opening. No purulent drainage or malodor. On the left foot, the wound is again unchanged. There is some light blue staining on the callus, but no malodor or purulent drainage. 10/13; right and left heel remanence of extensive plantar foot wounds. These are better than I remember by quite a big margin however he is still left with wounds on the left plantar heel and the right plantar heel. Been  using endoform bilaterally. A culture was done that showed apparently Pseudomonas but we are still waiting for the [facility] antibiotic [name] use gentamicin today. He is still very active by description I am not sure about the offloading of his noncasted right foot 10/20; both wounds right and left heel debrided not much change from last week. [name] Echevaria has arrived which is linezolid, gentamicin and ciprofloxacin we will use this with endoform. T contact cast on the left otal 06/02/2022: Both wounds are smaller today. There is still a fair amount of callus buildup around the right foot ulcer. The left is more superficial and nearly flush with the surrounding tissues. Also with slough and eschar buildup. 06/10/2022: The right sided wound appears [name] be nearly closed, if not completely so, although it is somewhat difficult [name] tell given the abnormal tissue and scarring in his foot. There is a fair amount of callus and crust accumulation. On the left, the wound looks about the same, again with callus and slough. He has an appointment next Thursday with Dr. Annamary Rummage in podiatry; I am hopeful that there may be some reconstructive options available for Mr. Sellitto. 06/16/2022: Both wounds have some eschar and callus accumulation. The right sided wound is extremely narrow and barely open; the left is narrower than last week. There is a little bit of slough. He has his appointment in podiatry later today. 06/23/2022: The patient met with Dr. Annamary Rummage last week and unfortunately, there are no reconstructive options that he believes would be helpful. He did order an MRI [name] evaluate for osteomyelitis and fortunately, none was seen. The left sided wound is a little bit shorter and narrower today. The right sided wound is about the same. There is callus and eschar accumulation bilaterally. 06/29/2022: Both feet have improved from last week. There is epithelium making a valiant effort [name] creep across the surface on  the left. The right side looks like it got a little dry and the deep crevasse in his midfoot has cracked. Both have eschar and there is some slough on the left. 07/07/2022: Both feet have improved. There is epithelium completely covering the calcaneus on the right with just a small opening in the crevasse in his midfoot. On the left, the open area of tissue is smaller but he continues [name] build up callus/eschar and slough. 07/15/2022: The opening in the midfoot on the right is about the same size, covered with eschar and a little bit of slough. The open portion of the left wound is narrower and shorter with a bit of slough buildup. He admits [name] being on his feet more than recommended. 12/14; as far as I can tell everything on the right foot is closed. There is some eschar I removed some of this I cannot identify any open wound here. As usual this will be a very vulnerable area going forward. On  the left this looks really quite healthy. I was pleasantly surprised [name] see how good this looked. Wound is certainly smaller and there appears [name] be healthy epithelialization. He has been using Promogran on the right and endoform on the left. He has been offloading the right foot with a heel offloading boot and he has a running shoe on the right foot 08/04/2022: The right foot remains closed. He has a thick cushioned insole in his sneaker. The left sided wound is smaller with just some slough and eschar accumulation. He is wearing the heel off loader on this foot. 08/15/2022: The right foot remains closed. The left wound has narrowed further. There is some slough and eschar accumulation. 08/25/2022: We put him in a peg assist shoe insert and as a result, he has more epithelialization of the ulcer with minimal slough and eschar accumulation. 09/01/2022: The wound is smaller by about half this week. Still with some slough on the surface. The peg assist shoe seems [name] be doing a remarkable job of adequately offloading  the site. 09/08/2022: There is a little bit more epithelium coming in. There is some slough and callus buildup. 09/16/2022: The wound measures about the same size, but the epithelium that has grown in looks more robust and stronger. There is some slough and callus buildup. 09/23/2022: The wound remains about the same size. The skin edges are looking rather senescent. 09/29/2022: The aggressive debridement I performed last week seems [name] have been effective. The wound is smaller and has significantly less slough accumulation. 10/06/2022: There has been quite a bit of epithelialization since last week. There are still some open areas with slough accumulation. There is some callus buildup around the perimeter. 10/13/2022: Continued improvement. Minimal callus accumulation. 3/14; patient presents for follow-up. He has been using endoform [name] the wound bed without issues. He is using a surgical shoe with peg assist for offloading. 10/27/2022: The wound dimensions are stable. There is some senescent skin accumulation around the perimeter. 11/04/2022: Last week I performed a very aggressive debridement in an effort [name] stimulate the healing cascade. As has been the case when I have done this before, the wound has responded well. There has been epithelialization and contraction of the wound. There are just a couple of small open areas. 11/10/2022: Unfortunately, his foot got wet secondary [name] sweating and there has been some breakdown of the tissue, particularly the skin just adjacent [name] the main ulcer. [name], [name] ([id number]) [id number] Page 6 of 17 11/18/2022: We continue [name] struggle with moisture-related tissue breakdown around the perimeter of his wound. This has caused the thin epithelium that had formed on the surface [name] disintegrate. The underlying surface of the wound has good granulation tissue. 11/25/2022: The edges of the wound are much less macerated, but the surface is actually  looking a little bit dry. There is slough accumulation. No obvious signs of infection. 12/01/2022: The wound edges are more macerated and broken down, making the wound larger. The surface has some slough on it. 12/08/2020: The wound looks better this week. There is significantly less maceration and moisture-related tissue breakdown. There is some slough on the wound surface. 12/15/2022: The wound continues [name] improve. There is almost no moisture-related tissue breakdown. There is epithelium beginning [name] fill in again from the edges. Light slough on the wound surface. 12/22/2022: More epithelium has filled him from around the edges. There is some slough on the surface. No evidence of moisture-related tissue breakdown. 01/05/2023: There has been more epithelialization,  particularly at the posterior calcaneal aspect of the wound. There has been a little bit of moisture accumulation with minimal maceration. 01/12/2023: More epithelium has filled in. There is very minimal accumulation of slough. 01/20/2023: The wound is measuring a little bit smaller today. It did measure deeper, but this appears [name] be secondary [name] some heaped up senescent skin around the edges. The epithelium that has been present at the posterior aspect of the wound seems [name] be a bit more robust with greater integrity. 01/26/2023: He reports intense itching [name] the skin around the wound and there has been some breakdown here. It looks fungal in nature. The wound itself looks pretty good with improved epithelialization with just some slough buildup. He has a little callus along the wound edges. 02/02/2023: He has been treating the periwound skin with an over-the-counter athlete's foot medication and notes significant improvement in his pruritus. The skin looks better here, as well. The wound continues [name] epithelialize. There is light slough accumulation. Electronic Signature(s) Signed: 02/02/2023 10:54:52 AM By: [name] MD FACS Entered By:  [name] on 02/02/2023 10:54:52 -------------------------------------------------------------------------------- Physical Exam Details Patient Name: Date of Service: [name], [name] [name] 02/02/2023 10:15 A M Medical Record Number: [id number] Patient Account Number: [id number] Date of Birth/Sex: Treating RN: 1976-02-18 (47 y.o. M) Primary Care Provider: Dorinda Hill Other Clinician: Referring Provider: Treating Provider/Extender: Jana Hakim in Treatment: 133 Constitutional . . . . no acute distress. Respiratory Normal work of breathing on room air. Notes 02/02/2023: He has been treating the periwound skin with an over-the-counter athlete's foot medication and notes significant improvement in his pruritus. The skin looks better here, as well. The wound continues [name] epithelialize. There is light slough accumulation. Electronic Signature(s) Signed: 02/02/2023 11:17:07 AM By: [name] MD FACS Previous Signature: 02/02/2023 10:56:53 AM Version By: [name] MD FACS Entered By: [name] on 02/02/2023 11:17:07 -------------------------------------------------------------------------------- Physician Orders Details Patient Name: Date of Service: [name], [name] [name] 02/02/2023 10:15 A M Medical Record Number: [id number] Patient Account Number: [id number] Date of Birth/Sex: Treating RN: Apr 30, 1976 (47 y.o. Lauris, Serviss, Henrietta Dine ([id number]) [phone].pdf Page 7 of 17 Primary Care Provider: Dorinda Hill Other Clinician: Referring Provider: Treating Provider/Extender: Jana Hakim in Treatment: [phone] Verbal / Phone Orders: No Diagnosis Coding ICD-10 Coding Code Description [phone] Non-pressure chronic ulcer of other part of left foot with fat layer exposed Follow-up Appointments ppointment in 1 week. - Dr. Lady Gary Room 3 Return A Anesthetic Wound #2 Left Calcaneus (In clinic) Topical  Lidocaine 4% applied [name] wound bed - In clinic Bathing/ Shower/ Hygiene May shower and wash wound with soap and water. Edema Control - Lymphedema / SCD / Other Bilateral Lower Extremities Avoid standing for long periods of time. Moisturize legs daily. - as needed Off-Loading Other: - keep pressure off of the bottom of your feet. Elevate legs throughout the day. Use the Shoe with the PegAssist off-loading insole Additional Orders / Instructions Follow Nutritious Diet - Try [name] get 70-100 grams of Protein a day+ Wound Treatment Wound #2 - Calcaneus Wound Laterality: Left Cleanser: Normal Saline (Generic) 1 x Per Day/30 Days Discharge Instructions: Cleanse the wound with Normal Saline prior [name] applying a clean dressing using gauze sponges, not tissue or cotton balls. Cleanser: Wound Cleanser (Generic) 1 x Per Day/30 Days Discharge Instructions: Cleanse the wound with wound cleanser prior [name] applying a clean dressing using gauze sponges, not tissue or cotton balls. Peri-Wound Care: Ketoconazole  Cream 2% 1 x Per Day/30 Days Discharge Instructions: Apply Ketoconazole as directed Topical: Gentamicin 1 x Per Day/30 Days Topical: compounding topical antibiotics from [facility] pharmacy 1 x Per Day/30 Days Discharge Instructions: HOLD Prim Dressing: Maxorb Extra Ag+ Alginate Dressing, 4x4.75 (in/in) 1 x Per Day/30 Days ary Discharge Instructions: Apply [name] wound bed as instructed Secondary Dressing: Drawtex 4x4 in 1 x Per Day/30 Days Discharge Instructions: Apply over primary dressing as directed. Secondary Dressing: Optifoam Non-Adhesive Dressing, 4x4 in (Generic) 1 x Per Day/30 Days Discharge Instructions: Apply over primary dressing as directed. Secondary Dressing: Woven Gauze Sponge, Non-Sterile 4x4 in (Generic) 1 x Per Day/30 Days Discharge Instructions: Apply over primary dressing as directed. Secured With: 69M Medipore H Soft Cloth Surgical T ape, 4 x 10 (in/yd) (Generic) 1 x Per Day/30  Days Discharge Instructions: Secure with tape as directed. Compression Wrap: Kerlix Roll 4.5x3.1 (in/yd) 1 x Per Day/30 Days Discharge Instructions: Apply Kerlix and Coban compression as directed. Add-Ons: [employer] Offloading Boot, Size Regular 1 x Per Day/30 Days Electronic Signature(s) Signed: 02/02/2023 11:57:31 AM By: [name] MD FACS Entered By: [name] on 02/02/2023 11:17:19 [name] ([id number]) [id number] Page 8 of 17 -------------------------------------------------------------------------------- Problem List Details Patient Name: Date of Service: [name] [name] 02/02/2023 10:15 A M Medical Record Number: [id number] Patient Account Number: [id number] Date of Birth/Sex: Treating RN: May 12, 1976 (46 y.o. [name] Primary Care Provider: Dorinda Hill Other Clinician: Referring Provider: Treating Provider/Extender: Jana Hakim in Treatment: [phone] Active Problems ICD-10 Encounter Code Description Active Date MDM Diagnosis [phone] Non-pressure chronic ulcer of other part of left foot with fat layer exposed 09/03/2020 No Yes Inactive Problems ICD-10 Code Description Active Date Inactive Date L97.512 Non-pressure chronic ulcer of other part of right foot with fat layer exposed 09/03/2020 09/03/2020 L89.893 Pressure ulcer of other site, stage 3 07/15/2020 07/15/2020 M62.81 Muscle weakness (generalized) 07/15/2020 07/15/2020 I10 Essential (primary) hypertension 07/15/2020 07/15/2020 M86.171 Other acute osteomyelitis, right ankle and foot 09/03/2020 09/03/2020 Resolved Problems Electronic Signature(s) Signed: 02/02/2023 10:53:36 AM By: [name] MD FACS Entered By: [name] on 02/02/2023 10:53:36 -------------------------------------------------------------------------------- Progress Note Details Patient Name: Date of Service: [name], [name] [place] E. 02/02/2023 10:15 A M Medical Record Number:  [id number] Patient Account Number: [id number] Date of Birth/Sex: Treating RN: [birth year] (47 y.o. M) Primary Care Provider: Dorinda Hill Other Clinician: Referring Provider: Treating Provider/Extender: Jana Hakim in Treatment: [address] Subjective Chief Complaint STRIDER, VALLANCE ([id number]) [id number] Page 9 of 17 Information obtained from Patient Bilateral Plantar Foot Ulcers History of Present Illness (HPI) Wounds are12/03/2020 upon evaluation today patient presents for initial inspection here in our clinic concerning issues he has been having with the bottoms of his feet bilaterally. He states these actually occurred as wounds when he was hospitalized for 5 months secondary [name] Covid. He was apparently with tilting bed where he was in an upright position quite frequently and apparently this occurred in some way shape or form during that time. Fortunately there is no sign of active infection at this time. No fevers, chills, nausea, vomiting, or diarrhea. With that being said he still has substantial wounds on the plantar aspects of his feet Theragen require quite a bit of work [name] get these [name] heal. He has been using Santyl currently though that is been problematic both in receiving the medication as well as actually paid for it as it is become quite expensive. Prior [name] the experience with Covid the  patient really did not have any major medical problems other than hypertension he does have some mild generalized weakness following the Covid experience. 07/22/2020 on evaluation today patient appears [name] be doing okay in regard [name] his foot ulcers I feel like the wound beds are showing signs of better improvement that I do believe the Iodoflex is helping in this regard. With that being said he does have a lot of drainage currently and this is somewhat blue/green in nature which is consistent with Pseudomonas. I do think a culture today would be appropriate  for [place] [name] evaluate and see if that is indeed the case I would likely start him on antibiotic orally as well he is not allergic [name] Cipro knows of no issues he has had in the past 12/21; patient was admitted [name] the clinic earlier this month with bilateral presumed pressure ulcers on the bottom of his feet apparently related [name] excessive pressure from a tilt table arrangement in the intensive care unit. Patient relates this [name] being on ECMO but I am not really sure that is exactly related [name] that. I must say I have never seen anything like this. He has fairly extensive full-thickness wounds extending from his heel towards his midfoot mostly centered laterally. There is already been some healing distally. He does not appear [name] have an arterial issue. He has been using gentamicin [name] the wound surfaces with Iodoflex [name] help with ongoing debridement 1/6; this is a patient with pressure ulcers on the bottom of his feet related [name] excessive pressure from a standing position in the intensive care unit. He is complaining of a lot of pain in the right heel. He is not a diabetic. 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 We have been using Iodoflex [name] help prepare the surfaces of both wounds for an advanced treatment product. He is nonambulatory spending most of his time in a wheelchair I have asked him not [name] propel the wheelchair with his heels 1/13; in general his wounds look better not much surface area change we have been using Iodoflex as of last week. I did an x-ray of the right heel as the patient was complaining of pain. I had some thoughts about a stress fracture perhaps Achilles tendon problems however what it showed was erosive changes along the inferior aspect of the calcaneus he now has a MRI booked for 1/20. 1/20; in general his wounds continue [name] be better. Some improvement in the large narrow areas proximally in his foot. He is still complaining of pain in the right heel  and tenderness in certain areas of this wound. His MRI is tonight. I am not just looking for osteomyelitis that was brought up on the x-ray I am wondering about stress fractures, tendon ruptures etc. He has no such findings on the left. Also noteworthy is that the patient had critical illness neuropathy and some of the discomfort may be actual improvement in nerve function I am just not sure. These wounds were initially in the setting of severe critical illness related [name] COVID-19. He was put in a standing position. He may have also been on pressors at the point contributing [name] tissue ischemia. By his description at some point these wounds were grossly necrotic extending proximally up into the Achilles part of his heel. I do not know that I have ever really seen pictures of them like this although they may exist in epic We have ordered Tri layer Oasis. I am trying [name]  stimulate some granulation in these areas. This is of course assuming the MRI is negative for infection 1/27; since the patient was last here he saw Dr. Earlene Plater of infectious disease. He is planned for vancomycin and ceftriaxone. Prior operative culture grew MSSA. Also ordered baseline lab work. He also ordered arterial studies although the ABIs in our clinic were normal as well as his clinical exam these were normal I do not think he needs [name] see vascular surgery. His ABIs at the [facility] were 1.22 in the right triphasic waveforms with a normal TBI of 1.15 on the left ABI of 1.22 with triphasic waveforms and a normal TBI of 1.08. Finally he saw Dr. Logan Bores who will follow him in for 2 months. At this point I do not think he felt that he needed a procedure on the right calcaneal bone. Dr. Earlene Plater is elected for broad-spectrum antibiotic The patient is still having pain in the right heel. He walks with a walker 2/3; wounds are generally smaller. He is tolerating his IV antibiotics. I believe this is vancomycin and ceftriaxone. We are still  waiting for Oasis burn in terms of his out-of-pocket max which he should be meeting soon given the IV antibiotics, MRIs etc. I have asked him [name] check in on this. We are using silver collagen in the meantime the wounds look better 2/10; tolerating IV vancomycin and Rocephin. We are waiting [name] apply for Oasis. Although I am not really sure where he is in his out-of-pocket max. 2/17 started the first application of Oasis trilayer. Still on antibiotics. The wounds have generally look better. The area on the left has a little more surface slough requiring debridement 2/24; second application of Oasis trilayer. The wound surface granulation is generally look better. The area on the left with undermining laterally I think is come in a bit. 10/08/2020 upon evaluation today patient is here today for [employer] application #3. Fortunately he seems [name] be doing extremely well with regard [name] this and we are seeing a lot of new epithelial growth which is great news. Fortunately there is no signs of active infection at this time. 10/16/2020 upon evaluation today patient appears [name] be doing well with regard [name] his foot ulcers. Do believe the Oasis has been of benefit for him. I do not see any signs of infection right now which is great news and I think that he has a lot of new epithelial growth which is great [name] see as well. The patient is very pleased [name] hear all of this. I do think we can proceed with the Oasis trilayer #4 today. 3/18; not as much improvement in these areas on his heels that I was hoping. I did reapply trilateral Oasis today the tissue looks healthier but not as much fill in as I was hoping. 3/25; better looking today I think this is come in a bit the tissue looks healthier. Triple layer Oasis reapplied #6 4/1; somewhat better looking definitely better looking surface not as much change in surface area as I was hoping. He may be spending more time [place] on days then he needs [name] although he does  have heel offloading boots. Triple layer Oasis reapplied #7 4/7; unfortunately apparently [facility] will not approve any further Oasis which is unfortunate since the patient did respond nicely both in terms of the condition of the wound bed as well as surface area. There is still some drainage coming from the wound but not a lot there  does not appear [name] be any infection 4/15; we have been using Hydrofera Blue. He continues [name] have nice rims of epithelialization on the right greater than the left. The left the epithelialization is coming from the tip of his heel. There is moderate drainage. In this that concerns me about a total contact cast. There is no evidence of infection 4/29; patient has been using Hydrofera Blue with dressing changes. He has no complaints or issues today. 5/5; using Hydrofera Blue. I actually think that he looks marginally better than the last time I saw this 3 weeks ago. There are rims of epithelialization on the left thumb coming from the medial side on the right. Using Hydrofera Blue 5/12; using Hydrofera Blue. These continue [name] make improvements in surface area. His drainage was not listed as severe I therefore went ahead and put a cast on the left foot. Right foot we will continue [name] dress his previous 5/16; back for first total contact cast change. He did not tolerate this particularly well cast injury on the anterior tibia among other issues. Difficulty sleeping. I talked him about this in some detail and afterwards is elected [name] continue. I told him I would like [name] have a cast on for 3 weeks [name] see if this is going [name] help at all. I think he agreed 5/19; I think the wound is better. There is no tunneling towards his midfoot. The undermining medially also looks better. He has a rim of new skin distally. I think we are making progress here. The area on the left also continues [name] look somewhat better [name] me using Hydrofera Blue. He has a list of complaints about  the cast but none of them seem serious 5/26; patient presents for 1 week follow-up. He has been using a total contact cast and tolerating this well. Hydrofera Blue is the main dressing used. He denies signs of infection. [name], [name] ([id number]) [id number] Page 10 of 17 6/2 Hydrofera Blue total contact cast on the left. These were large ulcers that formed in intensive care unit where the patient was recovering from COVID. May have had something to do with being ventilated in an upright positiono Pressors etc. We have been able [name] get the areas down considerably and a viable surface. There is some epithelialization in both sides. Note made of drainage 6/9; changed [name] [facility] last time because of drainage. He arrives with better looking surfaces and dimensions on the left than the right. Paradoxically the right actually probes more towards his midfoot the left is largely close down but both of these look improved. Using a total contact cast on the left 6/16; complex wounds on his bilateral plantar heels which were initially pressure injury from a stay in the ICU with COVID. We have been using silver alginate most recently. His dimensions of come in quite dramatically however not recently. We have been putting the left foot in a total contact cast 6/23; complex wounds on the bilateral plantar heels. I been putting the left in the cast paradoxically the area on the right is the one that is going towards closure at a faster rate. Quite a bit of drainage on the left. The patient went [name] see Dr. Logan Bores who said he was going [name] standby for skin grafts. I had actually considered sending him for skin grafts however he would be mandatorily off his feet for a period of weeks [name] months. I am thinking that the area on the right is going [name]  close on its own the area on the left has been more stubborn even though we have him in a total contact cast 6/30; took him out of a total  contact cast last week is the right heel seem [name] be making better progress than the left where I was placing the cast. We are using silver alginate. Both wounds are smaller right greater than left 7/12; both wounds look as though they are making some progress. We are using silver alginate. Heel offloading boots 7/26; very gradual progress especially on the right. Using silver alginate. He is wearing heel offloading boots 8/18; he continues [name] close these wounds down very gradually. Using silver alginate. The problem polymen being definitive about this is areas of what appears [name] be callus around the margins. This is not a 100% of the area but certainly sizable especially on the right 9/1; bilateral plantar feet wounds secondary [name] prolonged pressure while being ventilated for COVID-19 in an upright position. Essentially pressure ulcers on the bottom of his feet. He is made substantial progress using silver alginate. 9/14; bilateral plantar feet wounds secondary [name] prolonged pressure. Making progress using silver alginate. 9/29 bilateral plantar feet wounds secondary [name] prolonged pressure. I changed him [name] Iodoflex last week. MolecuLight showing reddened blush fluorescence 10/11; patient presents for follow-up. He has no issues or complaints today. He denies signs of infection. He continues [name] use Iodoflex and antibiotic ointment [name] the wound beds. 10/27; 2-week follow-up. No evidence of infection. He has callus and thick dry skin around the wound margins we have been using Iodoflex and Bactroban which was in response [name] a moderate left MolecuLight reddish blush fluorescence. 11/10; 2-week follow-up. Wound margins again have thick callus however the measurements of the actual wound sites are a lot smaller. Everything looks reasonably healthy here. We have been using Iodoflex He was approved for prime matrix but I have elected [name] delay this given the improvement in the surface area. Hopefully I will  not regret that decision as were getting close [name] the end of the year in terms of insurance payment 12/8; 2-week follow-up. Wounds are generally smaller in size. These were initially substantial wounds extending into the forefoot all the way into the heel on the bilateral plantar feet. They are now both located on the plantar heel distal aspect both of these have a lot of callus around the wounds I used a #5 curette [name] remove this on the right and the left also some subcutaneous debris [name] try and get the wound edges were using Iodoflex. He has heel offloading shoe 12/22; 2-week follow-up. Not really much improvement. He has thick callus around the outer edges of both wounds. I remove this there is some nonviable subcutaneous tissue as well. We have been using Iodoflex. Her intake nurse and myself spontaneously thought of a total contact cast I went back in May. At that time we really were not seeing much of an improvement with a cast although the wound was in a much different situation I would like [name] retry this in 2 weeks and I discussed this with the patient 08/12/2021; the patient has had some improvement with the Iodoflex. The the area on the left heel plantar more improved than the right. I had [name] put him in a total contact cast on the left although I decided [name] put that off for 2 weeks. I am going [name] change his primary dressing [name] silver collagen. I think in both areas he has had some  improvement most of the healing seems [name] be more proximal in the heel. The wounds are in the mid aspect. A lot of thick callus on the right heel however. 1/19; we are using silver collagen on both plantar heel areas. He has had some improvement today. The left did not require any debridement. He still had some eschar on the right that was debrided but both seem [name] have contracted. I did not put it total contact cast on him today 2/2 we have been using silver collagen. The area on the right plantar heel has areas that  appear [name] be epithelialized interspersed with dry flaking callus and dry skin. I removed this. This really looks better than on the other side. On the left still a large area with raised edges and debris on the surface. 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 He has no issues or complaints today. 2/9; not much change in the left foot wound with 1 week of a cast we are using silver collagen. Silver collagen on the right side. The right side has been the better wound surface. We will reapply the total contact cast on the left 2/16; not much improvement on either side I been using silver collagen with a total contact cast on the left. I'm changing the Hydrofera Blue still with a total contact cast on the left 2/23; some improvement on both sides. Disappointing that he has thick callus around the area that we are putting in a total contact cast on the left. We've been using Hydrofera Blue on both wound areas. This is a man who at essentially pressure ulcers in addition [name] ischemia caused by medications [name] support his blood pressure (pressors) in the ICU. He was being ventilated in the standing position for severe Covid. A Shiley the wounds extended across his entire foot but are now localized [name] his plantar heels bilaterally. We have made progress however neither areas healed. I continue [name] think the total contact cast is helped albeit painstakingly slowly. He has never wanted a plastic surgery consult although I don't know that they would be interested in grafting in area in this location. 10/07/2021: Continued improvement bilaterally. There is still some callus around the left wound, despite the total contact cast. He has some increased pain in his right midfoot around 1 particular area. This has been painful in the past but seems [name] be a little bit worse. When his cast  was removed today, there was an area on the heel of the left foot that looks a bit macerated. He is also complaining of pain in his left thigh and hip which he thinks is secondary [name] the limb length discrepancy caused by the cast. 10/14/2021: He continues [name] improve. A little bit less callus around the left wound. He continues [name] endorse pain in his right midfoot, but this is not as significant as it was last week. The maceration on his left heel is improved. 10/21/2021: Continued improvement [name] both wounds. The maceration on his left heel is no longer evident. Less callus bilaterally. Epithelialization progressing. 10/28/2021: Significant improvement this week. The right sided wound is nearly closed with just a small open area at the middle. No maceration seen on the left heel. Continued epithelialization on both sides. No concern for infection. 11/04/2021: T oday, the wounds were measured a little bit differently and come out  as larger, but I actually think they are about the same [name] potentially even smaller, particularly on the left. He continues [name] accumulate some callus on the right. 11/11/2021: T oday, the patient is expressing some concern that the left wound, despite being in the total contact cast, is not progressing at the same rate as the 1 on the right. He is interested in trying a week without the cast [name] see how the wound does. The wounds are roughly the same size as last week, with the right perhaps being a little bit smaller. He continues [name] build up callus on both sites. 11/18/2021: Last week, I permitted the patient [name] [name] without his total contact cast, just [name] see if the cast was really making any difference. Today, both wounds have deteriorated [name] some extent, suggesting that the cast is providing benefit, at least on the left. Both are larger and have accumulated callus, slough, and other debris. 11/26/2021: I debrided both wounds quite aggressively last week in an effort [name] stimulate the  healing cascade. This appears [name] have been effective as the left [name], [name] ([id number]) [id number].pdf Page 11 of 17 sided wound is a full centimeter shorter in length. Although the right was measured slightly larger, on inspection, it looks as though an area of epithelialized tissue was included in the measurements. We have been using PolyMem Ag on the wound surfaces with a total contact cast [name] the left. 12/02/2021: It appears that the intake personnel are including epithelialized tissue in his wound measurements; the right wound is almost completely epithelialized; there is just a crater at the proximal midfoot with some open areas. On the left, he has built up some callus, but the overall wound surface looks good. There is some senescent skin around the wound margin. He has been in PolyMem Ag bilaterally with a total contact cast on the left. 12/09/2021: The right wound is nearly closed; there is just a small open area at the mid calcaneus. On the left, the wound is smaller with minimal callus buildup. No significant drainage. 12/16/2021: The right calcaneal wound remains minimally open at the mid calcaneus; the rest has epithelialized. On the left, the wound is also a little bit smaller. There is some senescent tissue on the periphery. He is getting his first application of a trial skin substitute called Vendaje today. 12/23/2021: The wound on his right calcaneus is nearly closed; there is just a small area at the most distal aspect of the calcaneus that is open. On the left, the area where we applied [name] the skin substitute has a healthier-looking bed of granulation tissue. The wound dimensions are not significantly different on this side but the wound surface is improved. 12/30/2021: The wound on the right calcaneus has not changed significantly aside from some accumulation of callus. On the left, the open area is smaller and continues [name] have an improved surface. He  continues [name] accumulate callus around the wound. He is here for his third application of Vendaje. 01/06/2022: The right calcaneal wound is down [name] just a couple of millimeters. He continues [name] accumulate periwound callus. He unfortunately got his cast wet earlier in the week and his left foot is macerated, resulting in some superficial skin loss just distal [name] the open wound. The open wound itself, however, is much smaller and has a healthier appearing surface. He is here for his fourth application of Vendaje. 01/13/2022: The right calcaneal wound is about the same. Unfortunately, once again, his cast got  wet and his foot is again macerated. This is caused the left calcaneal wound [name] enlarge. He is here for his fifth application of Vendaje. 01/20/2022: The right calcaneal wound is very small. There is some periwound callus accumulation. He purchased a new cast protector last week and this has been effective in avoiding the maceration that has been occurring on the left. The left calcaneal wound is narrower and has a healthy and viable-appearing surface. He is here for his 6 application of Vendaje. 01/27/2022: The right calcaneal wound is down [name] just a pinhole. There is some periwound slough and callus. On the left, the wound is narrower and shorter by about a centimeter. The surface is robust and viable-appearing. Unfortunately, the rep for the trial skin substitute product did not provide any for [place] [name] use today. 02/04/2022: The right calcaneal wound remains unchanged. There is more accumulated callus. On the left, although the intake nurse measured it a little bit longer, it looks about the same [name] me. The surface has a layer of slough, but underneath this, there is good granulation tissue. 02/10/2022: The right calcaneus wound is nearly closed. There is still some callus that builds up around the site. The left side looks about the same in terms of dimensions, but the surface is more robust and  vital-appearing. 02/16/2022: The area of the right calcaneus that was nearly closed last week has closed, but there is a small opening at the mid foot where it looks like some moisture got retained and caused some reopening. The left foot wound is narrower and shallower. Both sites have a fair amount of periwound callus and eschar. 02/24/2022: The small midfoot opening on the right calcaneus is a little bit smaller today. The left foot wound is narrower and shallower. He continues [name] accumulate periwound callus. No concern for infection. 03/01/2022: The patient came [name] clinic early because he showered and got his cast wet. Fortunately, there is no significant maceration [name] his foot but the callus softened and it looks like the wound on his left calcaneus may be a little bit wider. The wound on his right calcaneus is just a narrow slit. Continued accumulation of periwound callus bilaterally. 03/08/2022: The wound on his right calcaneus is very nearly closed, just a small pinpoint opening under a bit of eschar; the left wound has come in quite a bit, as well. It is narrower and shorter than at our last visit. Still with accumulated callus and eschar bilaterally. 03/17/2022: The right calcaneal wound is healed. The left wound is smaller and the surface itself is very clean, but there is some blue-green staining on the periwound callus, concerning for Pseudomonas aeruginosa. 03/23/2022: The right calcaneal wound remains closed. The left wound continues [name] contract. No further blue-green staining. Small amount of callus and slough accumulation. 03/28/2022: He came in early today because he had gotten his cast wet. On inspection, the wound itself did not get wet or macerated, just a little bit of the forefoot. The wound itself is basically unchanged. 04/07/2022: The right foot wound remains closed. The left wound is the smallest that I have seen it [name] date. It is narrower and shorter. It still continues  [name] accumulate slough on the surface. 04/15/2022: There is a band of epithelium now dividing the small left plantar foot wound in 2. There is still some slough on the surface. 04/21/2022: The wound continues [name] narrow. Just a little bit of slough on the surface. He seems [name] be responding well  [name] endoform. 04/28/2022: Continued slow contraction of the wound. There is a little slough on the surface and some periwound callus. We have been using endoform and total contact cast. 05/05/2022: The wound appears [name] have stalled. There is slough and some periwound eschar/callus. No concern for infection, however. 05/12/2022: Unfortunately, his right foot has reopened. It is located at the most posterior aspect of his surgical incision. The area was noted [name] have drainage coming from it when his padding was removed today. Underneath some callus and senescent skin, there is an opening. No purulent drainage or malodor. On the left foot, the wound is again unchanged. There is some light blue staining on the callus, but no malodor or purulent drainage. 10/13; right and left heel remanence of extensive plantar foot wounds. These are better than I remember by quite a big margin however he is still left with wounds on the left plantar heel and the right plantar heel. Been using endoform bilaterally. A culture was done that showed apparently Pseudomonas but we are still waiting for the [facility] antibiotic [name] use gentamicin today. He is still very active by description I am not sure about the offloading of his noncasted right foot 10/20; both wounds right and left heel debrided not much change from last week. [name] Echevaria has arrived which is linezolid, gentamicin and ciprofloxacin we will use this with endoform. T contact cast on the left otal 06/02/2022: Both wounds are smaller today. There is still a fair amount of callus buildup around the right foot ulcer. The left is more superficial and nearly flush with the surrounding  tissues. Also with slough and eschar buildup. 06/10/2022: The right sided wound appears [name] be nearly closed, if not completely so, although it is somewhat difficult [name] tell given the abnormal tissue and scarring in his foot. There is a fair amount of callus and crust accumulation. On the left, the wound looks about the same, again with callus and slough. He has an appointment next Thursday with Dr. Annamary Rummage in podiatry; I am hopeful that there may be some reconstructive options available for Mr. San. [name], [name] ([id number]) [id number] Page 12 of 17 06/16/2022: Both wounds have some eschar and callus accumulation. The right sided wound is extremely narrow and barely open; the left is narrower than last week. There is a little bit of slough. He has his appointment in podiatry later today. 06/23/2022: The patient met with Dr. Annamary Rummage last week and unfortunately, there are no reconstructive options that he believes would be helpful. He did order an MRI [name] evaluate for osteomyelitis and fortunately, none was seen. The left sided wound is a little bit shorter and narrower today. The right sided wound is about the same. There is callus and eschar accumulation bilaterally. 06/29/2022: Both feet have improved from last week. There is epithelium making a valiant effort [name] creep across the surface on the left. The right side looks like it got a little dry and the deep crevasse in his midfoot has cracked. Both have eschar and there is some slough on the left. 07/07/2022: Both feet have improved. There is epithelium completely covering the calcaneus on the right with just a small opening in the crevasse in his midfoot. On the left, the open area of tissue is smaller but he continues [name] build up callus/eschar and slough. 07/15/2022: The opening in the midfoot on the right is about the same size, covered with eschar and a little bit of slough. The open portion of the left  wound  is narrower and shorter with a bit of slough buildup. He admits [name] being on his feet more than recommended. 12/14; as far as I can tell everything on the right foot is closed. There is some eschar I removed some of this I cannot identify any open wound here. As usual this will be a very vulnerable area going forward. On the left this looks really quite healthy. I was pleasantly surprised [name] see how good this looked. Wound is certainly smaller and there appears [name] be healthy epithelialization. He has been using Promogran on the right and endoform on the left. He has been offloading the right foot with a heel offloading boot and he has a running shoe on the right foot 08/04/2022: The right foot remains closed. He has a thick cushioned insole in his sneaker. The left sided wound is smaller with just some slough and eschar accumulation. He is wearing the heel off loader on this foot. 08/15/2022: The right foot remains closed. The left wound has narrowed further. There is some slough and eschar accumulation. 08/25/2022: We put him in a peg assist shoe insert and as a result, he has more epithelialization of the ulcer with minimal slough and eschar accumulation. 09/01/2022: The wound is smaller by about half this week. Still with some slough on the surface. The peg assist shoe seems [name] be doing a remarkable job of adequately offloading the site. 09/08/2022: There is a little bit more epithelium coming in. There is some slough and callus buildup. 09/16/2022: The wound measures about the same size, but the epithelium that has grown in looks more robust and stronger. There is some slough and callus buildup. 09/23/2022: The wound remains about the same size. The skin edges are looking rather senescent. 09/29/2022: The aggressive debridement I performed last week seems [name] have been effective. The wound is smaller and has significantly less slough accumulation. 10/06/2022: There has been quite a bit of epithelialization  since last week. There are still some open areas with slough accumulation. There is some callus buildup around the perimeter. 10/13/2022: Continued improvement. Minimal callus accumulation. 3/14; patient presents for follow-up. He has been using endoform [name] the wound bed without issues. He is using a surgical shoe with peg assist for offloading. 10/27/2022: The wound dimensions are stable. There is some senescent skin accumulation around the perimeter. 11/04/2022: Last week I performed a very aggressive debridement in an effort [name] stimulate the healing cascade. As has been the case when I have done this before, the wound has responded well. There has been epithelialization and contraction of the wound. There are just a couple of small open areas. 11/10/2022: Unfortunately, his foot got wet secondary [name] sweating and there has been some breakdown of the tissue, particularly the skin just adjacent [name] the main ulcer. 11/18/2022: We continue [name] struggle with moisture-related tissue breakdown around the perimeter of his wound. This has caused the thin epithelium that had formed on the surface [name] disintegrate. The underlying surface of the wound has good granulation tissue. 11/25/2022: The edges of the wound are much less macerated, but the surface is actually looking a little bit dry. There is slough accumulation. No obvious signs of infection. 12/01/2022: The wound edges are more macerated and broken down, making the wound larger. The surface has some slough on it. 12/08/2020: The wound looks better this week. There is significantly less maceration and moisture-related tissue breakdown. There is some slough on the wound surface. 12/15/2022: The wound continues [name]  improve. There is almost no moisture-related tissue breakdown. There is epithelium beginning [name] fill in again from the edges. Light slough on the wound surface. 12/22/2022: More epithelium has filled him from around the edges. There is some slough on the  surface. No evidence of moisture-related tissue breakdown. 01/05/2023: There has been more epithelialization, particularly at the posterior calcaneal aspect of the wound. There has been a little bit of moisture accumulation with minimal maceration. 01/12/2023: More epithelium has filled in. There is very minimal accumulation of slough. 01/20/2023: The wound is measuring a little bit smaller today. It did measure deeper, but this appears [name] be secondary [name] some heaped up senescent skin around the edges. The epithelium that has been present at the posterior aspect of the wound seems [name] be a bit more robust with greater integrity. 01/26/2023: He reports intense itching [name] the skin around the wound and there has been some breakdown here. It looks fungal in nature. The wound itself looks pretty good with improved epithelialization with just some slough buildup. He has a little callus along the wound edges. 02/02/2023: He has been treating the periwound skin with an over-the-counter athlete's foot medication and notes significant improvement in his pruritus. The skin looks better here, as well. The wound continues [name] epithelialize. There is light slough accumulation. Patient History Information obtained from Patient. [name], [name] ([id number]) [id number] Page 13 of 17 Family History Cancer - Maternal Grandparents, Diabetes - Father,Paternal Grandparents, Heart Disease - Maternal Grandparents, Hypertension - Father,Paternal Grandparents, Lung Disease - Siblings, No family history of Hereditary Spherocytosis, Kidney Disease, Seizures, Stroke, Thyroid Problems, Tuberculosis. Social History Never smoker, Marital Status - Married, Alcohol Use - Never, Drug Use - No History, Caffeine Use - Daily - tea, soda. Medical History Eyes Denies history of Cataracts, Glaucoma, Optic Neuritis Ear/Nose/Mouth/Throat Denies history of Chronic sinus problems/congestion, Middle ear  problems Hematologic/Lymphatic Denies history of Anemia, Hemophilia, Human Immunodeficiency Virus, Lymphedema, Sickle Cell Disease Respiratory Patient has history of Asthma Denies history of Aspiration, Chronic Obstructive Pulmonary Disease (COPD), Pneumothorax, Sleep Apnea, Tuberculosis Cardiovascular Patient has history of Angina - with COVID, Hypertension Denies history of Arrhythmia, Congestive Heart Failure, Coronary Artery Disease, Deep Vein Thrombosis, Hypotension, Myocardial Infarction, Peripheral Arterial Disease, Peripheral Venous Disease, Phlebitis, Vasculitis Gastrointestinal Denies history of Cirrhosis , Colitis, Crohns, Hepatitis A, Hepatitis B, Hepatitis C Endocrine Denies history of Type I Diabetes, Type II Diabetes Genitourinary Denies history of End Stage Renal Disease Immunological Denies history of Lupus Erythematosus, Raynauds, Scleroderma Integumentary (Skin) Denies history of History of Burn Musculoskeletal Denies history of Gout, Rheumatoid Arthritis, Osteoarthritis, Osteomyelitis Neurologic Denies history of Dementia, Neuropathy, Quadriplegia, Paraplegia, Seizure Disorder Oncologic Denies history of Received Chemotherapy, Received Radiation Psychiatric Denies history of Anorexia/bulimia, Confinement Anxiety Hospitalization/Surgery History - COVID PNA 07/22/2019- 11/14/2019. - 03/27/2020 wound debridement/ skin graft. Medical A Surgical History Notes nd Constitutional Symptoms (General Health) COVID PNA 07/22/2019-11/14/2019 V[facility] ECMO, foot drop left foot , Genitourinary kidney stone Psychiatric anxiety Objective Constitutional no acute distress. Vitals Time Taken: 10:00 AM, Height: 69 in, Weight: 280 lbs, BMI: 41.3, Temperature: 98 F, Pulse: 98 bpm, Respiratory Rate: 18 breaths/min, Blood Pressure: 137/84 mmHg. Respiratory Normal work of breathing on room air. General Notes: 02/02/2023: He has been treating the periwound skin with an over-the-counter  athlete's foot medication and notes significant improvement in his pruritus. The skin looks better here, as well. The wound continues [name] epithelialize. There is light slough accumulation. Integumentary (Hair, Skin) Wound #2 status is Open. Original cause of wound  was Pressure Injury. The date acquired was: 10/07/2019. The wound has been in treatment 133 weeks. The wound is located on the Left Calcaneus. The wound measures 3.3cm length x 0.9cm width x 0.2cm depth; 2.333cm^2 area and 0.467cm^3 volume. There is Fat Layer (Subcutaneous Tissue) exposed. There is no tunneling or undermining noted. There is a medium amount of serosanguineous drainage noted. The wound margin is distinct with the outline attached [name] the wound base. There is medium (34-66%) red, pink granulation within the wound bed. There is a medium (34-66%) amount of necrotic tissue within the wound bed including Adherent Slough. The periwound skin appearance exhibited: Dry/Scaly, Hemosiderin Staining. The periwound skin appearance did not exhibit: Callus, Crepitus, Excoriation, Induration, Rash, Scarring, Maceration, Atrophie Blanche, Cyanosis, Ecchymosis, Mottled, Pallor, Rubor, Erythema. Periwound temperature was noted as No Abnormality. Assessment [name], [name] ([id number]) [id number] Page 14 of 17 Active Problems ICD-10 Non-pressure chronic ulcer of other part of left foot with fat layer exposed Procedures Wound #2 Pre-procedure diagnosis of Wound #2 is a Pressure Ulcer located on the Left Calcaneus . There was a Selective/Open Wound Skin/Epidermis Debridement with a total area of 2.33 sq cm performed by [name], MD. With the following instrument(s): Curette [name] remove Viable and Non-Viable tissue/material. Material removed includes Slough, Skin: Dermis, and Skin: Epidermis after achieving pain control using Lidocaine 4% T opical Solution. No specimens were taken. A time out was conducted at  10:26, prior [name] the start of the procedure. A Minimum amount of bleeding was controlled with Pressure. The procedure was tolerated well with a pain level of 0 throughout and a pain level of 0 following the procedure. Post Debridement Measurements: 3.3cm length x 0.9cm width x 0.2cm depth; 0.467cm^3 volume. Post debridement Stage noted as Category/Stage III. Character of Wound/Ulcer Post Debridement is improved. Post procedure Diagnosis Wound #2: Same as Pre-Procedure General Notes: Scribed for Dr Lady Gary by Brenton Grills RN.Marland Kitchen Plan Follow-up Appointments: Return Appointment in 1 week. - Dr. Lady Gary Room 3 Anesthetic: Wound #2 Left Calcaneus: (In clinic) Topical Lidocaine 4% applied [name] wound bed - In clinic Bathing/ Shower/ Hygiene: May shower and wash wound with soap and water. Edema Control - Lymphedema / SCD / Other: Avoid standing for long periods of time. Moisturize legs daily. - as needed Off-Loading: Other: - keep pressure off of the bottom of your feet. Elevate legs throughout the day. Use the Shoe with the PegAssist off-loading insole Additional Orders / Instructions: Follow Nutritious Diet - Try [name] get 70-100 grams of Protein a day+ WOUND #2: - Calcaneus Wound Laterality: Left Cleanser: Normal Saline (Generic) 1 x Per Day/30 Days Discharge Instructions: Cleanse the wound with Normal Saline prior [name] applying a clean dressing using gauze sponges, not tissue or cotton balls. Cleanser: Wound Cleanser (Generic) 1 x Per Day/30 Days Discharge Instructions: Cleanse the wound with wound cleanser prior [name] applying a clean dressing using gauze sponges, not tissue or cotton balls. Peri-Wound Care: Ketoconazole Cream 2% 1 x Per Day/30 Days Discharge Instructions: Apply Ketoconazole as directed Topical: Gentamicin 1 x Per Day/30 Days Topical: compounding topical antibiotics from [facility] pharmacy 1 x Per Day/30 Days Discharge Instructions: HOLD Prim Dressing: Maxorb Extra Ag+ Alginate  Dressing, 4x4.75 (in/in) 1 x Per Day/30 Days ary Discharge Instructions: Apply [name] wound bed as instructed Secondary Dressing: Drawtex 4x4 in 1 x Per Day/30 Days Discharge Instructions: Apply over primary dressing as directed. Secondary Dressing: Optifoam Non-Adhesive Dressing, 4x4 in (Generic) 1 x Per Day/30 Days Discharge Instructions: Apply over primary  dressing as directed. Secondary Dressing: Woven Gauze Sponge, Non-Sterile 4x4 in (Generic) 1 x Per Day/30 Days Discharge Instructions: Apply over primary dressing as directed. Secured With: 35M Medipore H Soft Cloth Surgical T ape, 4 x 10 (in/yd) (Generic) 1 x Per Day/30 Days Discharge Instructions: Secure with tape as directed. Com pression Wrap: Kerlix Roll 4.5x3.1 (in/yd) 1 x Per Day/30 Days Discharge Instructions: Apply Kerlix and Coban compression as directed. Add-Ons: [employer] Offloading Boot, Size Regular 1 x Per Day/30 Days 02/02/2023: He has been treating the periwound skin with an over-the-counter athlete's foot medication and notes significant improvement in his pruritus. The skin looks better here, as well. The wound continues [name] epithelialize. There is light slough accumulation. I used a curette [name] debride slough and senescent skin from the wound. We will continue topical gentamicin, silver alginate, drawtex and periwound over-the- counter athlete's foot treatment. Follow-up in 1 week. Electronic Signature(s) Signed: 02/02/2023 11:18:09 AM By: [name] MD FACS Previous Signature: 02/02/2023 10:57:34 AM Version By: [name] MD FACS Entered By: [name] on 02/02/2023 11:18:09 [name] ([id number]) [id number] Page 15 of 17 -------------------------------------------------------------------------------- HxROS Details Patient Name: Date of Service: [name] [name] 02/02/2023 10:15 A M Medical Record Number: [id number] Patient Account Number: [id number] Date of  Birth/Sex: Treating RN: Mar 24, 1976 (47 y.o. M) Primary Care Provider: Dorinda Hill Other Clinician: Referring Provider: Treating Provider/Extender: Jana Hakim in Treatment: 133 Information Obtained From Patient Constitutional Symptoms (General Health) Medical History: Past Medical History Notes: COVID PNA 07/22/2019-11/14/2019 V[facility] ECMO, foot drop left foot , Eyes Medical History: Negative for: Cataracts; Glaucoma; Optic Neuritis Ear/Nose/Mouth/Throat Medical History: Negative for: Chronic sinus problems/congestion; Middle ear problems Hematologic/Lymphatic Medical History: Negative for: Anemia; Hemophilia; Human Immunodeficiency Virus; Lymphedema; Sickle Cell Disease Respiratory Medical History: Positive for: Asthma Negative for: Aspiration; Chronic Obstructive Pulmonary Disease (COPD); Pneumothorax; Sleep Apnea; Tuberculosis Cardiovascular Medical History: Positive for: Angina - with COVID; Hypertension Negative for: Arrhythmia; Congestive Heart Failure; Coronary Artery Disease; Deep Vein Thrombosis; Hypotension; Myocardial Infarction; Peripheral Arterial Disease; Peripheral Venous Disease; Phlebitis; Vasculitis Gastrointestinal Medical History: Negative for: Cirrhosis ; Colitis; Crohns; Hepatitis A; Hepatitis B; Hepatitis C Endocrine Medical History: Negative for: Type I Diabetes; Type II Diabetes Genitourinary Medical History: Negative for: End Stage Renal Disease Past Medical History Notes: kidney stone Immunological Medical History: Negative for: Lupus Erythematosus; Raynauds; Scleroderma Integumentary (Skin) Medical History: Negative for: History of Burn KAYVEON, LENNARTZ ([id number]) [phone].pdf Page 16 of 17 Musculoskeletal Medical History: Negative for: Gout; Rheumatoid Arthritis; Osteoarthritis; Osteomyelitis Neurologic Medical History: Negative for: Dementia; Neuropathy; Quadriplegia; Paraplegia; Seizure  Disorder Oncologic Medical History: Negative for: Received Chemotherapy; Received Radiation Psychiatric Medical History: Negative for: Anorexia/bulimia; Confinement Anxiety Past Medical History Notes: anxiety Immunizations Pneumococcal Vaccine: Received Pneumococcal Vaccination: No Implantable Devices None Hospitalization / Surgery History Type of Hospitalization/Surgery COVID PNA 07/22/2019- 11/14/2019 03/27/2020 wound debridement/ skin graft Family and Social History Cancer: Yes - Maternal Grandparents; Diabetes: Yes - Father,Paternal Grandparents; Heart Disease: Yes - Maternal Grandparents; Hereditary Spherocytosis: No; Hypertension: Yes - Father,Paternal Grandparents; Kidney Disease: No; Lung Disease: Yes - Siblings; Seizures: No; Stroke: No; Thyroid Problems: No; Tuberculosis: No; Never smoker; Marital Status - Married; Alcohol Use: Never; Drug Use: No History; Caffeine Use: Daily - tea, soda; Financial Concerns: No; Food, Clothing or Shelter Needs: No; Support System Lacking: No; Transportation Concerns: No Electronic Signature(s) Signed: 02/02/2023 11:57:31 AM By: [name] MD FACS Entered By: [name] on 02/02/2023 10:54:59 -------------------------------------------------------------------------------- SuperBill Details Patient Name: Date of Service:  [name], [name] 02/02/2023 Medical Record Number: [id number] Patient Account Number: [id number] Date of Birth/Sex: Treating RN: 09/16/1975 (47 y.o. [name] Primary Care Provider: Dorinda Hill Other Clinician: Referring Provider: Treating Provider/Extender: Jana Hakim in Treatment: 133 Diagnosis Coding ICD-10 Codes Code Description [phone] Non-pressure chronic ulcer of other part of left foot with fat layer exposed Facility Procedures : [name], [name] Code: [id number] [name] FANIEL ([id number] Description: [phone] - DEBRIDE WOUND 1ST 20 SQ CM OR < ICD-10 Diagnosis Description L97.522  Non-pressure chronic ulcer of other part of left foot with fat layer exposed ) [phone] Modifier: [id number] Quantity: 1 27.pdf Page 17 of 17 Physician Procedures : CPT4 Code Description Modifier [id number] - WC PHYS LEVEL 4 - EST PT 25 ICD-10 Diagnosis Description L97.522 Non-pressure chronic ulcer of other part of left foot with fat layer exposed Quantity: 1 : [id number] - WC PHYS DEBR WO ANESTH 20 SQ CM ICD-10 Diagnosis Description L97.522 Non-pressure chronic ulcer of other part of left foot with fat layer exposed Quantity: 1 Electronic Signature(s) Signed: 02/02/2023 11:18:20 AM By: [name] MD FACS Entered By: [name] on 02/02/2023 11:18:20

## 2023-02-02 NOTE — Progress Notes (Signed)
[name], [name] ([id number]) [id number] Page 1 of 8 Visit Report for 02/02/2023 Arrival Information Details Patient Name: Date of Service: [name], TO [name] E. 02/02/2023 10:15 A M Medical Record Number: [id number] Patient Account Number: [id number] Date of Birth/Sex: Treating RN: [birth year] (47 y.o. [name] Primary Care Ridwan Bondy: Dorinda Hill Other Clinician: Referring Latica Hohmann: Treating Brindley Madarang/Extender: Jana Hakim in Treatment: 133 Visit Information History Since Last Visit All ordered tests and consults were completed: Yes Patient Arrived: Wheel Chair Added or deleted any medications: No Arrival Time: 10:03 Any new allergies or adverse reactions: No Accompanied By: self Had a fall or experienced change in No Transfer Assistance: None activities of daily living that may affect Patient Requires Transmission-Based Precautions: No risk of falls: Patient Has Alerts: No Signs or symptoms of abuse/neglect since last visito No Hospitalized since last visit: No Implantable device outside of the clinic excluding No cellular tissue based products placed in the center since last visit: Pain Present Now: No Electronic Signature(s) Signed: 02/02/2023 12:38:14 PM By: Brenton Grills Entered By: Brenton Grills on 02/02/2023 10:04:07 -------------------------------------------------------------------------------- Encounter Discharge Information Details Patient Name: Date of Service: [name], TO [name] E. 02/02/2023 10:15 A M Medical Record Number: [id number] Patient Account Number: [id number] Date of Birth/Sex: Treating RN: [birth year] (47 y.o. [name] Primary Care Trayonna Bachmeier: Dorinda Hill Other Clinician: Referring Leory Allinson: Treating Tedford Berg/Extender: Jana Hakim in Treatment: [phone] Encounter Discharge Information Items Post Procedure Vitals Discharge Condition: Stable Temperature (F): 98.4 Ambulatory  Status: Wheelchair Pulse (bpm): 76 Discharge Destination: Home Respiratory Rate (breaths/min): 18 Transportation: Private Auto Blood Pressure (mmHg): 128/70 Accompanied By: son Schedule Follow-up Appointment: Yes Clinical Summary of Care: Patient Declined Electronic Signature(s) Signed: 02/02/2023 12:38:14 PM By: Brenton Grills Entered By: Brenton Grills on 02/02/2023 10:30:25 [name] ([id number]) [id number] Page 2 of 8 -------------------------------------------------------------------------------- Lower Extremity Assessment Details Patient Name: Date of Service: [name] 02/02/2023 10:15 A M Medical Record Number: [id number] Patient Account Number: [id number] Date of Birth/Sex: Treating RN: 01-03-1976 (47 y.o. [name] Primary Care Ervan Heber: Dorinda Hill Other Clinician: Referring Melinda Gwinner: Treating Gelisa Tieken/Extender: Jana Hakim in Treatment: 133 Edema Assessment Assessed: [name]: No] Franne Forts: No] Edema: [Left: N] [Right: o] Calf Left: Right: Point of Measurement: 29 cm From Medial Instep 42.8 cm Ankle Left: Right: Point of Measurement: 9 cm From Medial Instep 24.7 cm Vascular Assessment Pulses: Dorsalis Pedis Palpable: [Left:Yes] Electronic Signature(s) Signed: 02/02/2023 12:38:14 PM By: Brenton Grills Entered By: Brenton Grills on 02/02/2023 10:07:45 -------------------------------------------------------------------------------- Multi Wound Chart Details Patient Name: Date of Service: [name], [name] 02/02/2023 10:15 A M Medical Record Number: [id number] Patient Account Number: [id number] Date of Birth/Sex: Treating RN: 1976/07/30 (47 y.o. M) Primary Care Nai Dasch: Dorinda Hill Other Clinician: Referring Mathayus Stanbery: Treating Jayton Popelka/Extender: Jana Hakim in Treatment: 133 Vital Signs Height(in): 69 Pulse(bpm): 98 Weight(lbs): 280 Blood Pressure(mmHg): 137/84 Body  Mass Index(BMI): 41.3 Temperature(F): 98 Respiratory Rate(breaths/min): 18 [2:Photos:] [N/A:N/A] Left Calcaneus N/A N/A Wound Location: Pressure Injury N/A N/A Wounding Event: Pressure Ulcer N/A N/A Primary Etiology: Asthma, Angina, Hypertension N/A N/A Comorbid History: 10/07/2019 N/A N/A Date Acquired: 133 N/A N/A Weeks of Treatment: Open N/A N/A Wound Status: No N/A N/A Wound Recurrence: 3.3x0.9x0.2 N/A N/A Measurements L x W x D (cm) 2.333 N/A N/A A (cm) : rea 0.467 N/A N/A Volume (cm) : 91.40% N/A N/A % Reduction in A rea: 98.30% N/A N/A % Reduction in Volume: Category/Stage III N/A  N/A Classification: Medium N/A N/A Exudate A mount: Serosanguineous N/A N/A Exudate Type: red, brown N/A N/A Exudate Color: Distinct, outline attached N/A N/A Wound Margin: Medium (34-66%) N/A N/A Granulation A mount: Red, Pink N/A N/A Granulation Quality: Medium (34-66%) N/A N/A Necrotic A mount: Fat Layer (Subcutaneous Tissue): Yes N/A N/A Exposed Structures: Fascia: No Tendon: No Muscle: No Joint: No Bone: No Debridement - Selective/Open Wound N/A N/A Debridement: Pre-procedure Verification/Time Out 10:26 N/A N/A Taken: Lidocaine 4% Topical Solution N/A N/A Pain Control: Slough N/A N/A Tissue Debrided: Skin/Epidermis N/A N/A Level: 2.33 N/A N/A Debridement A (sq cm): rea Curette N/A N/A Instrument: Minimum N/A N/A Bleeding: Pressure N/A N/A Hemostasis A chieved: 0 N/A N/A Procedural Pain: 0 N/A N/A Post Procedural Pain: Procedure was tolerated well N/A N/A Debridement Treatment Response: 3.3x0.9x0.2 N/A N/A Post Debridement Measurements L x W x D (cm) 0.467 N/A N/A Post Debridement Volume: (cm) Category/Stage III N/A N/A Post Debridement Stage: Excoriation: No N/A N/A Periwound Skin Texture: Induration: No Callus: No Crepitus: No Rash: No Scarring: No Dry/Scaly: Yes N/A N/A Periwound Skin Moisture: Maceration: No Hemosiderin Staining:  Yes N/A N/A Periwound Skin Color: Atrophie Blanche: No Cyanosis: No Ecchymosis: No Erythema: No Mottled: No Pallor: No Rubor: No No Abnormality N/A N/A Temperature: Debridement N/A N/A Procedures Performed: Treatment Notes Wound #2 (Calcaneus) Wound Laterality: Left Cleanser Normal Saline Discharge Instruction: Cleanse the wound with Normal Saline prior to applying a clean dressing using gauze sponges, not tissue or cotton balls. Wound Cleanser Discharge Instruction: Cleanse the wound with wound cleanser prior to applying a clean dressing using gauze sponges, not tissue or cotton balls. Peri-Wound Care Ketoconazole Cream 2% Discharge Instruction: Apply Ketoconazole as directed Topical Gentamicin compounding topical antibiotics from [facility] pharmacy Discharge Instruction: [name], [name] ([id number]) [id number] Page 4 of 8 Primary Dressing Maxorb Extra Ag+ Alginate Dressing, 4x4.75 (in/in) Discharge Instruction: Apply to wound bed as instructed Secondary Dressing Drawtex 4x4 in Discharge Instruction: Apply over primary dressing as directed. Optifoam Non-Adhesive Dressing, 4x4 in Discharge Instruction: Apply over primary dressing as directed. Woven Gauze Sponge, Non-Sterile 4x4 in Discharge Instruction: Apply over primary dressing as directed. Secured With 68M Medipore H Soft Cloth Surgical T ape, 4 x 10 (in/yd) Discharge Instruction: Secure with tape as directed. Compression Wrap Kerlix Roll 4.5x3.1 (in/yd) Discharge Instruction: Apply Kerlix and Coban compression as directed. Compression Stockings Add-Ons [place] Vascular Offloading Boot, Size Regular Electronic Signature(s) Signed: 02/02/2023 10:53:45 AM By: [name] MD FACS Entered By: [name] on 02/02/2023 10:53:45 -------------------------------------------------------------------------------- Multi-Disciplinary Care Plan Details Patient Name: Date of Service: [name], TO [name] E. 02/02/2023 10:15 A M Medical Record Number: [id number] Patient Account Number: [id number] Date of Birth/Sex: Treating RN: 12-14-1975 (47 y.o. [name] Primary Care Zakarie Sturdivant: Dorinda Hill Other Clinician: Referring Arleene Settle: Treating Siddharth Babington/Extender: Jana Hakim in Treatment: 133 Multidisciplinary Care Plan reviewed with physician Active Inactive Wound/Skin Impairment Nursing Diagnoses: Impaired tissue integrity Knowledge deficit related to ulceration/compromised skin integrity Goals: Patient/caregiver will verbalize understanding of skin care regimen Date Initiated: 07/15/2020 Target Resolution Date: 05/08/2023 Goal Status: Active Ulcer/skin breakdown will have a volume reduction of 30% by week 4 Date Initiated: 07/15/2020 Date Inactivated: 08/20/2020 Target Resolution Date: 09/03/2020 Goal Status: Unmet Unmet Reason: no major changes. Ulcer/skin breakdown will heal within 14 weeks Date Initiated: 12/04/2020 Date Inactivated: 12/10/2020 Target Resolution Date: 12/10/2020 Unmet Reason: wounds still open at 14 Goal Status: Unmet weeks and today 21 weeks. Interventions: Assess patient/caregiver ability to obtain necessary  supplies Assess patient/caregiver ability to perform ulcer/skin care regimen upon admission and as needed Assess ulceration(s) every visit [name], [name] ([id number]) [id number] Page 5 of 8 Provide education on ulcer and skin care Treatment Activities: Skin care regimen initiated : 07/15/2020 Topical wound management initiated : 07/15/2020 Notes: Electronic Signature(s) Signed: 02/02/2023 12:38:14 PM By: Brenton Grills Entered By: Brenton Grills on 02/02/2023 10:14:08 -------------------------------------------------------------------------------- Pain Assessment Details Patient Name: Date of Service: [name], TO [name] E. 02/02/2023 10:15 A M Medical Record Number: [id number] Patient Account  Number: [id number] Date of Birth/Sex: Treating RN: 04-29-1976 (47 y.o. [name] Primary Care Latesia Norrington: Dorinda Hill Other Clinician: Referring Cavion Faiola: Treating Shaynna Husby/Extender: Jana Hakim in Treatment: 133 Active Problems Location of Pain Severity and Description of Pain Patient Has Paino No Site Locations Pain Management and Medication Current Pain Management: Electronic Signature(s) Signed: 02/02/2023 12:38:14 PM By: Brenton Grills Entered By: Brenton Grills on 02/02/2023 10:07:30 -------------------------------------------------------------------------------- Patient/Caregiver Education Details Patient Name: Date of Service: [name] 6/27/2024andnbsp10:15 A M Medical Record Number: [id number] Patient Account Number: [id number] Date of Birth/Gender: Treating RN: 1976-07-23 (47 y.o. [name] Primary Care Physician: Dorinda Hill Other Clinician: CUTTER, [name] ([id number]) [id number] Page 6 of 8 Referring Physician: Treating Physician/Extender: Jana Hakim in Treatment: 133 Education Assessment Education Provided To: Patient Education Topics Provided Wound/Skin Impairment: Methods: Explain/Verbal Responses: State content correctly Electronic Signature(s) Signed: 02/02/2023 12:38:14 PM By: Brenton Grills Entered By: Brenton Grills on 02/02/2023 10:14:31 -------------------------------------------------------------------------------- Wound Assessment Details Patient Name: Date of Service: [name], TO [name] E. 02/02/2023 10:15 A M Medical Record Number: [id number] Patient Account Number: [id number] Date of Birth/Sex: Treating RN: [birth year] (47 y.o. [name] Primary Care Nadira Single: Dorinda Hill Other Clinician: Referring Mirah Nevins: Treating Tyjuan Demetro/Extender: Jana Hakim in Treatment: 133 Wound Status Wound Number: 2 Primary Etiology: Pressure  Ulcer Wound Location: Left Calcaneus Wound Status: Open Wounding Event: Pressure Injury Comorbid History: Asthma, Angina, Hypertension Date Acquired: 10/07/2019 Weeks Of Treatment: 133 Clustered Wound: No Photos Wound Measurements Length: (cm) 3.3 Width: (cm) 0.9 Depth: (cm) 0.2 Area: (cm) 2.333 Volume: (cm) 0.467 % Reduction in Area: 91.4% % Reduction in Volume: 98.3% Tunneling: No Undermining: No Wound Description Classification: Category/Stage III Wound Margin: Distinct, outline attached Exudate Amount: Medium Exudate Type: Serosanguineous Exudate Color: red, brown Foul Odor After Cleansing: No Slough/Fibrino No Wound Bed [name], [name] ([id number]) [id number] Page 7 of 8 Granulation Amount: Medium (34-66%) Exposed Structure Granulation Quality: Red, Pink Fascia Exposed: No Necrotic Amount: Medium (34-66%) Fat Layer (Subcutaneous Tissue) Exposed: Yes Necrotic Quality: Adherent Slough Tendon Exposed: No Muscle Exposed: No Joint Exposed: No Bone Exposed: No Periwound Skin Texture Texture Color No Abnormalities Noted: No No Abnormalities Noted: No Callus: No Atrophie Blanche: No Crepitus: No Cyanosis: No Excoriation: No Ecchymosis: No Induration: No Erythema: No Rash: No Hemosiderin Staining: Yes Scarring: No Mottled: No Pallor: No Moisture Rubor: No No Abnormalities Noted: No Dry / Scaly: Yes Temperature / Pain Maceration: No Temperature: No Abnormality Treatment Notes Wound #2 (Calcaneus) Wound Laterality: Left Cleanser Normal Saline Discharge Instruction: Cleanse the wound with Normal Saline prior to applying a clean dressing using gauze sponges, not tissue or cotton balls. Wound Cleanser Discharge Instruction: Cleanse the wound with wound cleanser prior to applying a clean dressing using gauze sponges, not tissue or cotton balls. Peri-Wound Care Ketoconazole Cream 2% Discharge Instruction: Apply Ketoconazole as  directed Topical Gentamicin compounding topical antibiotics from [name] pharmacy Discharge Instruction: HOLD Primary  Dressing Maxorb Extra Ag+ Alginate Dressing, 4x4.75 (in/in) Discharge Instruction: Apply to wound bed as instructed Secondary Dressing Drawtex 4x4 in Discharge Instruction: Apply over primary dressing as directed. Optifoam Non-Adhesive Dressing, 4x4 in Discharge Instruction: Apply over primary dressing as directed. Woven Gauze Sponge, Non-Sterile 4x4 in Discharge Instruction: Apply over primary dressing as directed. Secured With 51M Medipore H Soft Cloth Surgical T ape, 4 x 10 (in/yd) Discharge Instruction: Secure with tape as directed. Compression Wrap Kerlix Roll 4.5x3.1 (in/yd) Discharge Instruction: Apply Kerlix and Coban compression as directed. Compression Stockings Add-Ons [place] Vascular Offloading Boot, Size Regular Electronic Signature(s) Signed: 02/02/2023 12:38:14 PM By: Brenton Grills Entered By: Brenton Grills on 02/02/2023 10:16:58 [name] ([id number]) [id number] Page 8 of 8 -------------------------------------------------------------------------------- Vitals Details Patient Name: Date of Service: [name], TO [name] E. 02/02/2023 10:15 A M Medical Record Number: [id number] Patient Account Number: [id number] Date of Birth/Sex: Treating RN: October 15, 1975 (47 y.o. [name] Primary Care Brayan Votaw: Dorinda Hill Other Clinician: Referring Keyton Bhat: Treating Theophilus Walz/Extender: Jana Hakim in Treatment: 133 Vital Signs Time Taken: 10:00 Temperature (F): 98 Height (in): 69 Pulse (bpm): 98 Weight (lbs): 280 Respiratory Rate (breaths/min): 18 Body Mass Index (BMI): 41.3 Blood Pressure (mmHg): 137/84 Reference Range: 80 - 120 mg / dl Electronic Signature(s) Signed: 02/02/2023 12:38:14 PM By: Brenton Grills Entered By: Brenton Grills on 02/02/2023 10:07:22

## 2023-02-03 DIAGNOSIS — Z87442 Personal history of urinary calculi: Secondary | ICD-10-CM | POA: Diagnosis not present

## 2023-02-03 DIAGNOSIS — R519 Headache, unspecified: Secondary | ICD-10-CM | POA: Diagnosis not present

## 2023-02-03 DIAGNOSIS — J45998 Other asthma: Secondary | ICD-10-CM | POA: Diagnosis not present

## 2023-02-03 DIAGNOSIS — K851 Biliary acute pancreatitis without necrosis or infection: Secondary | ICD-10-CM | POA: Diagnosis not present

## 2023-02-03 DIAGNOSIS — Z8659 Personal history of other mental and behavioral disorders: Secondary | ICD-10-CM | POA: Diagnosis not present

## 2023-02-03 DIAGNOSIS — F419 Anxiety disorder, unspecified: Secondary | ICD-10-CM | POA: Diagnosis not present

## 2023-02-03 DIAGNOSIS — I10 Essential (primary) hypertension: Secondary | ICD-10-CM | POA: Diagnosis not present

## 2023-02-03 DIAGNOSIS — K219 Gastro-esophageal reflux disease without esophagitis: Secondary | ICD-10-CM | POA: Diagnosis not present

## 2023-02-03 DIAGNOSIS — R0600 Dyspnea, unspecified: Secondary | ICD-10-CM | POA: Diagnosis not present

## 2023-02-03 DIAGNOSIS — Z8679 Personal history of other diseases of the circulatory system: Secondary | ICD-10-CM | POA: Diagnosis not present

## 2023-02-03 DIAGNOSIS — R7303 Prediabetes: Secondary | ICD-10-CM | POA: Diagnosis not present

## 2023-02-06 ENCOUNTER — Other Ambulatory Visit (HOSPITAL_COMMUNITY): Payer: Self-pay | Admitting: Surgery

## 2023-02-10 ENCOUNTER — Encounter (HOSPITAL_BASED_OUTPATIENT_CLINIC_OR_DEPARTMENT_OTHER): Payer: PPO | Attending: General Surgery | Admitting: General Surgery

## 2023-02-10 DIAGNOSIS — L299 Pruritus, unspecified: Secondary | ICD-10-CM | POA: Diagnosis not present

## 2023-02-10 DIAGNOSIS — L89623 Pressure ulcer of left heel, stage 3: Secondary | ICD-10-CM | POA: Diagnosis not present

## 2023-02-10 DIAGNOSIS — L97812 Non-pressure chronic ulcer of other part of right lower leg with fat layer exposed: Secondary | ICD-10-CM | POA: Insufficient documentation

## 2023-02-10 DIAGNOSIS — I10 Essential (primary) hypertension: Secondary | ICD-10-CM | POA: Insufficient documentation

## 2023-02-10 DIAGNOSIS — L97522 Non-pressure chronic ulcer of other part of left foot with fat layer exposed: Secondary | ICD-10-CM | POA: Diagnosis not present

## 2023-02-10 DIAGNOSIS — L89893 Pressure ulcer of other site, stage 3: Secondary | ICD-10-CM | POA: Diagnosis not present

## 2023-02-10 DIAGNOSIS — J45909 Unspecified asthma, uncomplicated: Secondary | ICD-10-CM | POA: Insufficient documentation

## 2023-02-15 ENCOUNTER — Encounter (HOSPITAL_COMMUNITY)
Admission: RE | Admit: 2023-02-15 | Discharge: 2023-02-15 | Disposition: A | Discharge status: Home / Self Care | Payer: PPO | Source: Ambulatory Visit

## 2023-02-15 DIAGNOSIS — I4510 Unspecified right bundle-branch block: Secondary | ICD-10-CM | POA: Diagnosis not present

## 2023-02-15 DIAGNOSIS — Z01810 Encounter for preprocedural cardiovascular examination: Secondary | ICD-10-CM | POA: Insufficient documentation

## 2023-02-15 DIAGNOSIS — Z01818 Encounter for other preprocedural examination: Secondary | ICD-10-CM

## 2023-02-15 NOTE — Progress Notes (Signed)
COULSON, WEHNER ([id number]) [id number] Page 1 of 17 Visit Report for 02/10/2023 Chief Complaint Document Details Patient Name: Date of Service: [name], [name] [name] 02/10/2023 10:15 A M Medical Record Number: [id number] Patient Account Number: [id number] Date of Birth/Sex: Treating RN: 10-Oct-1975 (47 y.o. M) Primary Care Provider: Dorinda Hill Other Clinician: Referring Provider: Treating Provider/Extender: Jana Hakim in Treatment: 134 Information Obtained from: Patient Chief Complaint Bilateral Plantar Foot Ulcers Electronic Signature(s) Signed: 02/10/2023 11:01:56 AM By: [name] MD FACS Entered By: [name] on 02/10/2023 11:01:56 -------------------------------------------------------------------------------- Debridement Details Patient Name: Date of Service: [name], [name] [place] E. 02/10/2023 10:15 A M Medical Record Number: [id number] Patient Account Number: [id number] Date of Birth/Sex: Treating RN: 01-29-1976 (47 y.o. [name] Primary Care Provider: Dorinda Hill Other Clinician: Referring Provider: Treating Provider/Extender: Jana Hakim in Treatment: 134 Debridement Performed for Assessment: Wound #2 Left Calcaneus Performed By: Physician [name], MD Debridement Type: Debridement Level of Consciousness (Pre-procedure): Awake and Alert Pre-procedure Verification/Time Out Yes - 10:40 Taken: Start Time: 10:45 Pain Control: Lidocaine 4% Topical Solution Percent of Wound Bed Debrided: 100% T Area Debrided (cm): otal 2.01 Tissue and other material debrided: Non-Viable, Callus, Slough, Slough Level: Non-Viable Tissue Debridement Description: Selective/Open Wound Instrument: Curette Bleeding: Minimum Hemostasis Achieved: Pressure End Time: 10:48 Procedural Pain: 0 Post Procedural Pain: 0 Response [name] Treatment: Procedure was tolerated well Level of Consciousness (Post- Awake and  Alert procedure): Post Debridement Measurements of Total Wound Length: (cm) 3.2 Stage: Category/Stage III Width: (cm) 0.8 Depth: (cm) 0.2 Volume: (cm) 0.402 Character of Wound/Ulcer Post Debridement: Improved [name], [name] ([id number]) [id number] Page 2 of 17 Post Procedure Diagnosis Same as Pre-procedure Notes Scribed for Dr Lady Gary by Brenton Grills RN. Electronic Signature(s) Signed: 02/10/2023 11:26:06 AM By: [name] MD FACS Signed: 02/15/2023 4:28:57 PM By: Brenton Grills Entered By: Brenton Grills on 02/10/2023 10:49:15 -------------------------------------------------------------------------------- HPI Details Patient Name: Date of Service: [name], [name] [name] 02/10/2023 10:15 A M Medical Record Number: [id number] Patient Account Number: [id number] Date of Birth/Sex: Treating RN: 03-26-1976 (47 y.o. M) Primary Care Provider: Dorinda Hill Other Clinician: Referring Provider: Treating Provider/Extender: Jana Hakim in Treatment: 134 History of Present Illness HPI Description: Wounds are12/03/2020 upon evaluation today patient presents for initial inspection here in our clinic concerning issues he has been having with the bottoms of his feet bilaterally. He states these actually occurred as wounds when he was hospitalized for 5 months secondary [name] Covid. He was apparently with tilting bed where he was in an upright position quite frequently and apparently this occurred in some way shape or form during that time. Fortunately there is no sign of active infection at this time. No fevers, chills, nausea, vomiting, or diarrhea. With that being said he still has substantial wounds on the plantar aspects of his feet Theragen require quite a bit of work [name] get these [name] heal. He has been using Santyl currently though that is been problematic both in receiving the medication as well as actually paid for it as it is become quite  expensive. Prior [name] the experience with Covid the patient really did not have any major medical problems other than hypertension he does have some mild generalized weakness following the Covid experience. 07/22/2020 on evaluation today patient appears [name] be doing okay in regard [name] his foot ulcers I feel like the wound beds are showing signs of better improvement that I do believe the Iodoflex is helping  in this regard. With that being said he does have a lot of drainage currently and this is somewhat blue/green in nature which is consistent with Pseudomonas. I do think a culture today would be appropriate for [place] [name] evaluate and see if that is indeed the case I would likely start him on antibiotic orally as well he is not allergic [name] Cipro knows of no issues he has had in the past 12/21; patient was admitted [name] the clinic earlier this month with bilateral presumed pressure ulcers on the bottom of his feet apparently related [name] excessive pressure from a tilt table arrangement in the intensive care unit. Patient relates this [name] being on ECMO but I am not really sure that is exactly related [name] that. I must say I have never seen anything like this. He has fairly extensive full-thickness wounds extending from his heel towards his midfoot mostly centered laterally. There is already been some healing distally. He does not appear [name] have an arterial issue. He has been using gentamicin [name] the wound surfaces with Iodoflex [name] help with ongoing debridement 1/6; this is a patient with pressure ulcers on the bottom of his feet related [name] excessive pressure from a standing position in the intensive care unit. He is complaining of a lot of pain in the right heel. He is not a diabetic. 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 We have been using Iodoflex [name] help prepare the surfaces of both wounds for an advanced treatment product. He is nonambulatory spending most of his time in a wheelchair  I have asked him not [name] propel the wheelchair with his heels 1/13; in general his wounds look better not much surface area change we have been using Iodoflex as of last week. I did an x-ray of the right heel as the patient was complaining of pain. I had some thoughts about a stress fracture perhaps Achilles tendon problems however what it showed was erosive changes along the inferior aspect of the calcaneus he now has a MRI booked for 1/20. 1/20; in general his wounds continue [name] be better. Some improvement in the large narrow areas proximally in his foot. He is still complaining of pain in the right heel and tenderness in certain areas of this wound. His MRI is tonight. I am not just looking for osteomyelitis that was brought up on the x-ray I am wondering about stress fractures, tendon ruptures etc. He has no such findings on the left. Also noteworthy is that the patient had critical illness neuropathy and some of the discomfort may be actual improvement in nerve function I am just not sure. These wounds were initially in the setting of severe critical illness related [name] COVID-19. He was put in a standing position. He may have also been on pressors at the point contributing [name] tissue ischemia. By his description at some point these wounds were grossly necrotic extending proximally up into the Achilles part of his heel. I do not know that I have ever really seen pictures of them like this although they may exist in epic We have ordered Tri layer Oasis. I am trying [name] stimulate some granulation in these areas. This is of course assuming the MRI is negative for infection 1/27; since the patient was last here he saw Dr. Earlene Plater of infectious disease. He is planned for vancomycin and ceftriaxone. Prior operative culture grew MSSA. Also ordered baseline lab work. He also ordered arterial studies although the ABIs in our clinic were  normal as well as his clinical exam these were normal I do not think he  needs [name] see vascular surgery. His ABIs at the [facility] were 1.22 in the right triphasic waveforms with a normal TBI of 1.15 on the left ABI of 1.22 with triphasic waveforms and a normal TBI of 1.08. Finally he saw Dr. Logan Bores who will follow him in for 2 months. At this point I do not think he felt that he needed a procedure on the right calcaneal bone. Dr. Earlene Plater is elected for broad-spectrum antibiotic The patient is still having pain in the right heel. He walks with a walker 2/3; wounds are generally smaller. He is tolerating his IV antibiotics. I believe this is vancomycin and ceftriaxone. We are still waiting for Oasis burn in terms of his out-of-pocket max which he should be meeting soon given the IV antibiotics, MRIs etc. I have asked him [name] check in on this. We are using silver collagen in the meantime the wounds look better [name], [name] ([id number]) [phone].pdf Page 3 of 17 2/10; tolerating IV vancomycin and Rocephin. We are waiting [name] apply for Oasis. Although I am not really sure where he is in his out-of-pocket max. 2/17 started the first application of Oasis trilayer. Still on antibiotics. The wounds have generally look better. The area on the left has a little more surface slough requiring debridement 2/24; second application of Oasis trilayer. The wound surface granulation is generally look better. The area on the left with undermining laterally I think is come in a bit. 10/08/2020 upon evaluation today patient is here today for [employer] application #3. Fortunately he seems [name] be doing extremely well with regard [name] this and we are seeing a lot of new epithelial growth which is great news. Fortunately there is no signs of active infection at this time. 10/16/2020 upon evaluation today patient appears [name] be doing well with regard [name] his foot ulcers. Do believe the Oasis has been of benefit for him. I do not see any signs of infection right now which is great  news and I think that he has a lot of new epithelial growth which is great [name] see as well. The patient is very pleased [name] hear all of this. I do think we can proceed with the Oasis trilayer #4 today. 3/18; not as much improvement in these areas on his heels that I was hoping. I did reapply trilateral Oasis today the tissue looks healthier but not as much fill in as I was hoping. 3/25; better looking today I think this is come in a bit the tissue looks healthier. Triple layer Oasis reapplied #6 4/1; somewhat better looking definitely better looking surface not as much change in surface area as I was hoping. He may be spending more time [place] on days then he needs [name] although he does have heel offloading boots. Triple layer Oasis reapplied #7 4/7; unfortunately apparently [facility] will not approve any further Oasis which is unfortunate since the patient did respond nicely both in terms of the condition of the wound bed as well as surface area. There is still some drainage coming from the wound but not a lot there does not appear [name] be any infection 4/15; we have been using Hydrofera Blue. He continues [name] have nice rims of epithelialization on the right greater than the left. The left the epithelialization is coming from the tip of his heel. There is moderate drainage. In this that concerns me about  a total contact cast. There is no evidence of infection 4/29; patient has been using Hydrofera Blue with dressing changes. He has no complaints or issues today. 5/5; using Hydrofera Blue. I actually think that he looks marginally better than the last time I saw this 3 weeks ago. There are rims of epithelialization on the left thumb coming from the medial side on the right. Using Hydrofera Blue 5/12; using Hydrofera Blue. These continue [name] make improvements in surface area. His drainage was not listed as severe I therefore went ahead and put a cast on the left foot. Right foot we will continue  [name] dress his previous 5/16; back for first total contact cast change. He did not tolerate this particularly well cast injury on the anterior tibia among other issues. Difficulty sleeping. I talked him about this in some detail and afterwards is elected [name] continue. I told him I would like [name] have a cast on for 3 weeks [name] see if this is going [name] help at all. I think he agreed 5/19; I think the wound is better. There is no tunneling towards his midfoot. The undermining medially also looks better. He has a rim of new skin distally. I think we are making progress here. The area on the left also continues [name] look somewhat better [name] me using Hydrofera Blue. He has a list of complaints about the cast but none of them seem serious 5/26; patient presents for 1 week follow-up. He has been using a total contact cast and tolerating this well. Hydrofera Blue is the main dressing used. He denies signs of infection. 6/2 Hydrofera Blue total contact cast on the left. These were large ulcers that formed in intensive care unit where the patient was recovering from COVID. May have had something to do with being ventilated in an upright positiono Pressors etc. We have been able [name] get the areas down considerably and a viable surface. There is some epithelialization in both sides. Note made of drainage 6/9; changed [name] [facility] last time because of drainage. He arrives with better looking surfaces and dimensions on the left than the right. Paradoxically the right actually probes more towards his midfoot the left is largely close down but both of these look improved. Using a total contact cast on the left 6/16; complex wounds on his bilateral plantar heels which were initially pressure injury from a stay in the ICU with COVID. We have been using silver alginate most recently. His dimensions of come in quite dramatically however not recently. We have been putting the left foot in a total contact cast 6/23; complex  wounds on the bilateral plantar heels. I been putting the left in the cast paradoxically the area on the right is the one that is going towards closure at a faster rate. Quite a bit of drainage on the left. The patient went [name] see Dr. Logan Bores who said he was going [name] standby for skin grafts. I had actually considered sending him for skin grafts however he would be mandatorily off his feet for a period of weeks [name] months. I am thinking that the area on the right is going [name] close on its own the area on the left has been more stubborn even though we have him in a total contact cast 6/30; took him out of a total contact cast last week is the right heel seem [name] be making better progress than the left where I was placing the cast. We are using silver alginate. Both  wounds are smaller right greater than left 7/12; both wounds look as though they are making some progress. We are using silver alginate. Heel offloading boots 7/26; very gradual progress especially on the right. Using silver alginate. He is wearing heel offloading boots 8/18; he continues [name] close these wounds down very gradually. Using silver alginate. The problem polymen being definitive about this is areas of what appears [name] be callus around the margins. This is not a 100% of the area but certainly sizable especially on the right 9/1; bilateral plantar feet wounds secondary [name] prolonged pressure while being ventilated for COVID-19 in an upright position. Essentially pressure ulcers on the bottom of his feet. He is made substantial progress using silver alginate. 9/14; bilateral plantar feet wounds secondary [name] prolonged pressure. Making progress using silver alginate. 9/29 bilateral plantar feet wounds secondary [name] prolonged pressure. I changed him [name] Iodoflex last week. MolecuLight showing reddened blush fluorescence 10/11; patient presents for follow-up. He has no issues or complaints today. He denies signs of infection. He continues [name] use  Iodoflex and antibiotic ointment [name] the wound beds. 10/27; 2-week follow-up. No evidence of infection. He has callus and thick dry skin around the wound margins we have been using Iodoflex and Bactroban which was in response [name] a moderate left MolecuLight reddish blush fluorescence. 11/10; 2-week follow-up. Wound margins again have thick callus however the measurements of the actual wound sites are a lot smaller. Everything looks reasonably healthy here. We have been using Iodoflex He was approved for prime matrix but I have elected [name] delay this given the improvement in the surface area. Hopefully I will not regret that decision as were getting close [name] the end of the year in terms of insurance payment 12/8; 2-week follow-up. Wounds are generally smaller in size. These were initially substantial wounds extending into the forefoot all the way into the heel on the bilateral plantar feet. They are now both located on the plantar heel distal aspect both of these have a lot of callus around the wounds I used a #5 curette [name] remove this on the right and the left also some subcutaneous debris [name] try and get the wound edges were using Iodoflex. He has heel offloading shoe 12/22; 2-week follow-up. Not really much improvement. He has thick callus around the outer edges of both wounds. I remove this there is some nonviable subcutaneous tissue as well. We have been using Iodoflex. Her intake nurse and myself spontaneously thought of a total contact cast I went back in May. At that time we really were not seeing much of an improvement with a cast although the wound was in a much different situation I would like [name] retry this in 2 weeks and I discussed this with the patient 08/12/2021; the patient has had some improvement with the Iodoflex. The the area on the left heel plantar more improved than the right. I had [name] put him in a total contact cast on the left although I decided [name] put that off for 2 weeks. I am  going [name] change his primary dressing [name] silver collagen. I think in both areas he has had some improvement most of the healing seems [name] be more proximal in the heel. The wounds are in the mid aspect. A lot of thick callus on the right heel however. 1/19; we are using silver collagen on both plantar heel areas. He has had some improvement today. The left did not require any debridement. He still had some eschar  on the right that was debrided but both seem [name] have contracted. I did not put it total contact cast on him today 2/2 we have been using silver collagen. The area on the right plantar heel has areas that appear [name] be epithelialized interspersed with dry flaking callus and dry skin. I removed this. This really looks better than on the other side. On the left still a large area with raised edges and debris on the surface. 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 He has no issues or complaints today. 2/9; not much change in the left foot wound with 1 week of a cast we are using silver collagen. Silver collagen on the right side. The right side has been the better wound surface. We will reapply the total contact cast on the left [name], [name] ([id number]) [phone].pdf Page 4 of 17 2/16; not much improvement on either side I been using silver collagen with a total contact cast on the left. I'm changing the Hydrofera Blue still with a total contact cast on the left 2/23; some improvement on both sides. Disappointing that he has thick callus around the area that we are putting in a total contact cast on the left. We've been using Hydrofera Blue on both wound areas. This is a man who at essentially pressure ulcers in addition [name] ischemia caused by medications [name] support his blood pressure (pressors) in the ICU. He was being ventilated in the  standing position for severe Covid. A Shiley the wounds extended across his entire foot but are now localized [name] his plantar heels bilaterally. We have made progress however neither areas healed. I continue [name] think the total contact cast is helped albeit painstakingly slowly. He has never wanted a plastic surgery consult although I don't know that they would be interested in grafting in area in this location. 10/07/2021: Continued improvement bilaterally. There is still some callus around the left wound, despite the total contact cast. He has some increased pain in his right midfoot around 1 particular area. This has been painful in the past but seems [name] be a little bit worse. When his cast was removed today, there was an area on the heel of the left foot that looks a bit macerated. He is also complaining of pain in his left thigh and hip which he thinks is secondary [name] the limb length discrepancy caused by the cast. 10/14/2021: He continues [name] improve. A little bit less callus around the left wound. He continues [name] endorse pain in his right midfoot, but this is not as significant as it was last week. The maceration on his left heel is improved. 10/21/2021: Continued improvement [name] both wounds. The maceration on his left heel is no longer evident. Less callus bilaterally. Epithelialization progressing. 10/28/2021: Significant improvement this week. The right sided wound is nearly closed with just a small open area at the middle. No maceration seen on the left heel. Continued epithelialization on both sides. No concern for infection. 11/04/2021: T oday, the wounds were measured a little bit differently and come out as larger, but I actually think they are about the same [name] potentially even smaller, particularly on the left. He continues [name] accumulate some callus on the right. 11/11/2021: T oday, the patient is expressing some concern that the left wound, despite being in the total contact cast, is not  progressing at the same rate as the 1 on the right. He is interested in trying a week without the cast [name] see how the wound does. The wounds are roughly the same size as last week, with the right perhaps being a little bit smaller. He continues [name] build up callus on both sites. 11/18/2021: Last week, I permitted the patient [name] go without his total contact cast, just [name] see if the cast was really making any difference. Today, both wounds have deteriorated [name] some extent, suggesting that the cast is providing benefit, at least on the left. Both are larger and have accumulated callus, slough, and other debris. 11/26/2021: I debrided both wounds quite aggressively last week in an effort [name] stimulate the healing cascade. This appears [name] have been effective as the left sided wound is a full centimeter shorter in length. Although the right was measured slightly larger, on inspection, it looks as though an area of epithelialized tissue was included in the measurements. We have been using PolyMem Ag on the wound surfaces with a total contact cast [name] the left. 12/02/2021: It appears that the intake personnel are including epithelialized tissue in his wound measurements; the right wound is almost completely epithelialized; there is just a crater at the proximal midfoot with some open areas. On the left, he has built up some callus, but the overall wound surface looks good. There is some senescent skin around the wound margin. He has been in PolyMem Ag bilaterally with a total contact cast on the left. 12/09/2021: The right wound is nearly closed; there is just a small open area at the mid calcaneus. On the left, the wound is smaller with minimal callus buildup. No significant drainage. 12/16/2021: The right calcaneal wound remains minimally open at the mid calcaneus; the rest has epithelialized. On the left, the wound is also a little bit smaller. There is some senescent tissue on the periphery. He is getting his  first application of a trial skin substitute called Vendaje today. 12/23/2021: The wound on his right calcaneus is nearly closed; there is just a small area at the most distal aspect of the calcaneus that is open. On the left, the area where we applied [name] the skin substitute has a healthier-looking bed of granulation tissue. The wound dimensions are not significantly different on this side but the wound surface is improved. 12/30/2021: The wound on the right calcaneus has not changed significantly aside from some accumulation of callus. On the left, the open area is smaller and continues [name] have an improved surface. He continues [name] accumulate callus around the wound. He is here for his third application of Vendaje. 01/06/2022: The right calcaneal wound is down [name] just a couple of millimeters. He continues [name] accumulate periwound callus. He unfortunately got his cast wet earlier in the week and his left foot is macerated, resulting in some superficial skin loss just distal [name] the open wound. The open wound itself, however, is much smaller and has a healthier appearing surface. He is here for his fourth application of Vendaje. 01/13/2022: The right calcaneal wound is about the same. Unfortunately, once again, his cast got wet and his foot is again macerated. This is caused the left calcaneal wound [name] enlarge. He is here for his fifth application of Vendaje. 01/20/2022: The right calcaneal wound is very small. There is some periwound callus accumulation. He purchased a new cast protector last week and this has been effective in avoiding the maceration that has been occurring  on the left. The left calcaneal wound is narrower and has a healthy and viable-appearing surface. He is here for his 6 application of Vendaje. 01/27/2022: The right calcaneal wound is down [name] just a pinhole. There is some periwound slough and callus. On the left, the wound is narrower and shorter by about a centimeter. The surface is robust  and viable-appearing. Unfortunately, the rep for the trial skin substitute product did not provide any for [place] [name] use today. 02/04/2022: The right calcaneal wound remains unchanged. There is more accumulated callus. On the left, although the intake nurse measured it a little bit longer, it looks about the same [name] me. The surface has a layer of slough, but underneath this, there is good granulation tissue. 02/10/2022: The right calcaneus wound is nearly closed. There is still some callus that builds up around the site. The left side looks about the same in terms of dimensions, but the surface is more robust and vital-appearing. 02/16/2022: The area of the right calcaneus that was nearly closed last week has closed, but there is a small opening at the mid foot where it looks like some moisture got retained and caused some reopening. The left foot wound is narrower and shallower. Both sites have a fair amount of periwound callus and eschar. 02/24/2022: The small midfoot opening on the right calcaneus is a little bit smaller today. The left foot wound is narrower and shallower. He continues [name] accumulate periwound callus. No concern for infection. 03/01/2022: The patient came [name] clinic early because he showered and got his cast wet. Fortunately, there is no significant maceration [name] his foot but the callus softened and it looks like the wound on his left calcaneus may be a little bit wider. The wound on his right calcaneus is just a narrow slit. Continued accumulation of periwound callus bilaterally. 03/08/2022: The wound on his right calcaneus is very nearly closed, just a small pinpoint opening under a bit of eschar; the left wound has come in quite a bit, as well. It is narrower and shorter than at our last visit. Still with accumulated callus and eschar bilaterally. 03/17/2022: The right calcaneal wound is healed. The left wound is smaller and the surface itself is very clean, but there is some blue-green  staining on the periwound callus, concerning for Pseudomonas aeruginosa. [name], [name] ([id number]) [id number] Page 5 of 17 03/23/2022: The right calcaneal wound remains closed. The left wound continues [name] contract. No further blue-green staining. Small amount of callus and slough accumulation. 03/28/2022: He came in early today because he had gotten his cast wet. On inspection, the wound itself did not get wet or macerated, just a little bit of the forefoot. The wound itself is basically unchanged. 04/07/2022: The right foot wound remains closed. The left wound is the smallest that I have seen it [name] date. It is narrower and shorter. It still continues [name] accumulate slough on the surface. 04/15/2022: There is a band of epithelium now dividing the small left plantar foot wound in 2. There is still some slough on the surface. 04/21/2022: The wound continues [name] narrow. Just a little bit of slough on the surface. He seems [name] be responding well [name] endoform. 04/28/2022: Continued slow contraction of the wound. There is a little slough on the surface and some periwound callus. We have been using endoform and total contact cast. 05/05/2022: The wound appears [name] have stalled. There is slough and some periwound eschar/callus. No concern for infection, however. 05/12/2022: Unfortunately,  his right foot has reopened. It is located at the most posterior aspect of his surgical incision. The area was noted [name] have drainage coming from it when his padding was removed today. Underneath some callus and senescent skin, there is an opening. No purulent drainage or malodor. On the left foot, the wound is again unchanged. There is some light blue staining on the callus, but no malodor or purulent drainage. 10/13; right and left heel remanence of extensive plantar foot wounds. These are better than I remember by quite a big margin however he is still left with wounds on the left plantar heel and the  right plantar heel. Been using endoform bilaterally. A culture was done that showed apparently Pseudomonas but we are still waiting for the [facility] antibiotic [name] use gentamicin today. He is still very active by description I am not sure about the offloading of his noncasted right foot 10/20; both wounds right and left heel debrided not much change from last week. [name] Echevaria has arrived which is linezolid, gentamicin and ciprofloxacin we will use this with endoform. T contact cast on the left otal 06/02/2022: Both wounds are smaller today. There is still a fair amount of callus buildup around the right foot ulcer. The left is more superficial and nearly flush with the surrounding tissues. Also with slough and eschar buildup. 06/10/2022: The right sided wound appears [name] be nearly closed, if not completely so, although it is somewhat difficult [name] tell given the abnormal tissue and scarring in his foot. There is a fair amount of callus and crust accumulation. On the left, the wound looks about the same, again with callus and slough. He has an appointment next Thursday with Dr. Annamary Rummage in podiatry; I am hopeful that there may be some reconstructive options available for Mr. Hauk. 06/16/2022: Both wounds have some eschar and callus accumulation. The right sided wound is extremely narrow and barely open; the left is narrower than last week. There is a little bit of slough. He has his appointment in podiatry later today. 06/23/2022: The patient met with Dr. Annamary Rummage last week and unfortunately, there are no reconstructive options that he believes would be helpful. He did order an MRI [name] evaluate for osteomyelitis and fortunately, none was seen. The left sided wound is a little bit shorter and narrower today. The right sided wound is about the same. There is callus and eschar accumulation bilaterally. 06/29/2022: Both feet have improved from last week. There is epithelium making a valiant effort [name] creep  across the surface on the left. The right side looks like it got a little dry and the deep crevasse in his midfoot has cracked. Both have eschar and there is some slough on the left. 07/07/2022: Both feet have improved. There is epithelium completely covering the calcaneus on the right with just a small opening in the crevasse in his midfoot. On the left, the open area of tissue is smaller but he continues [name] build up callus/eschar and slough. 07/15/2022: The opening in the midfoot on the right is about the same size, covered with eschar and a little bit of slough. The open portion of the left wound is narrower and shorter with a bit of slough buildup. He admits [name] being on his feet more than recommended. 12/14; as far as I can tell everything on the right foot is closed. There is some eschar I removed some of this I cannot identify any open wound here. As usual this will be a very vulnerable  area going forward. On the left this looks really quite healthy. I was pleasantly surprised [name] see how good this looked. Wound is certainly smaller and there appears [name] be healthy epithelialization. He has been using Promogran on the right and endoform on the left. He has been offloading the right foot with a heel offloading boot and he has a running shoe on the right foot 08/04/2022: The right foot remains closed. He has a thick cushioned insole in his sneaker. The left sided wound is smaller with just some slough and eschar accumulation. He is wearing the heel off loader on this foot. 08/15/2022: The right foot remains closed. The left wound has narrowed further. There is some slough and eschar accumulation. 08/25/2022: We put him in a peg assist shoe insert and as a result, he has more epithelialization of the ulcer with minimal slough and eschar accumulation. 09/01/2022: The wound is smaller by about half this week. Still with some slough on the surface. The peg assist shoe seems [name] be doing a remarkable job  of adequately offloading the site. 09/08/2022: There is a little bit more epithelium coming in. There is some slough and callus buildup. 09/16/2022: The wound measures about the same size, but the epithelium that has grown in looks more robust and stronger. There is some slough and callus buildup. 09/23/2022: The wound remains about the same size. The skin edges are looking rather senescent. 09/29/2022: The aggressive debridement I performed last week seems [name] have been effective. The wound is smaller and has significantly less slough accumulation. 10/06/2022: There has been quite a bit of epithelialization since last week. There are still some open areas with slough accumulation. There is some callus buildup around the perimeter. 10/13/2022: Continued improvement. Minimal callus accumulation. 3/14; patient presents for follow-up. He has been using endoform [name] the wound bed without issues. He is using a surgical shoe with peg assist for offloading. 10/27/2022: The wound dimensions are stable. There is some senescent skin accumulation around the perimeter. 11/04/2022: Last week I performed a very aggressive debridement in an effort [name] stimulate the healing cascade. As has been the case when I have done this before, the wound has responded well. There has been epithelialization and contraction of the wound. There are just a couple of small open areas. 11/10/2022: Unfortunately, his foot got wet secondary [name] sweating and there has been some breakdown of the tissue, particularly the skin just adjacent [name] the [name], [name] ([id number]) [phone].pdf Page 6 of 17 main ulcer. 11/18/2022: We continue [name] struggle with moisture-related tissue breakdown around the perimeter of his wound. This has caused the thin epithelium that had formed on the surface [name] disintegrate. The underlying surface of the wound has good granulation tissue. 11/25/2022: The edges of the wound are much less macerated,  but the surface is actually looking a little bit dry. There is slough accumulation. No obvious signs of infection. 12/01/2022: The wound edges are more macerated and broken down, making the wound larger. The surface has some slough on it. 12/08/2020: The wound looks better this week. There is significantly less maceration and moisture-related tissue breakdown. There is some slough on the wound surface. 12/15/2022: The wound continues [name] improve. There is almost no moisture-related tissue breakdown. There is epithelium beginning [name] fill in again from the edges. Light slough on the wound surface. 12/22/2022: More epithelium has filled him from around the edges. There is some slough on the surface. No evidence of moisture-related tissue breakdown. 01/05/2023: There  has been more epithelialization, particularly at the posterior calcaneal aspect of the wound. There has been a little bit of moisture accumulation with minimal maceration. 01/12/2023: More epithelium has filled in. There is very minimal accumulation of slough. 01/20/2023: The wound is measuring a little bit smaller today. It did measure deeper, but this appears [name] be secondary [name] some heaped up senescent skin around the edges. The epithelium that has been present at the posterior aspect of the wound seems [name] be a bit more robust with greater integrity. 01/26/2023: He reports intense itching [name] the skin around the wound and there has been some breakdown here. It looks fungal in nature. The wound itself looks pretty good with improved epithelialization with just some slough buildup. He has a little callus along the wound edges. 02/02/2023: He has been treating the periwound skin with an over-the-counter athlete's foot medication and notes significant improvement in his pruritus. The skin looks better here, as well. The wound continues [name] epithelialize. There is light slough accumulation. 02/10/2023: The periwound skin still looks fairly angry. He reports  multiple small blisters that have opened up. He is not having the intense itching that he had prior, however. There has been more epithelialization at the calcaneus and there is minimal slough accumulation. Electronic Signature(s) Signed: 02/10/2023 11:06:15 AM By: [name] MD FACS Entered By: [name] on 02/10/2023 11:06:15 -------------------------------------------------------------------------------- Physical Exam Details Patient Name: Date of Service: [name], [name] [name] 02/10/2023 10:15 A M Medical Record Number: [id number] Patient Account Number: [id number] Date of Birth/Sex: Treating RN: 01/31/1976 (47 y.o. M) Primary Care Provider: Dorinda Hill Other Clinician: Referring Provider: Treating Provider/Extender: Jana Hakim in Treatment: 134 Constitutional . . . . no acute distress. Respiratory Normal work of breathing on room air. Notes 02/10/2023: The periwound skin still looks fairly angry. He reports multiple small blisters that have opened up. He is not having the intense itching that he had prior, however. There has been more epithelialization at the calcaneus and there is minimal slough accumulation. Electronic Signature(s) Signed: 02/10/2023 11:07:00 AM By: [name] MD FACS Entered By: [name] on 02/10/2023 11:07:00 Physician Orders Details -------------------------------------------------------------------------------- [name] ([id number]) [id number] Page 7 of 17 Patient Name: Date of Service: [name], [name] [name] 02/10/2023 10:15 A M Medical Record Number: [id number] Patient Account Number: [id number] Date of Birth/Sex: Treating RN: 1976-01-06 (47 y.o. [name] Primary Care Provider: Dorinda Hill Other Clinician: Referring Provider: Treating Provider/Extender: Jana Hakim in Treatment: 134 Verbal / Phone Orders: No Diagnosis Coding ICD-10 Coding Code  Description [phone] Non-pressure chronic ulcer of other part of left foot with fat layer exposed Follow-up Appointments ppointment in 1 week. - Dr. Lady Gary Room 3 Return A Anesthetic Wound #2 Left Calcaneus (In clinic) Topical Lidocaine 4% applied [name] wound bed - In clinic Bathing/ Shower/ Hygiene May shower and wash wound with soap and water. Edema Control - Lymphedema / SCD / Other Bilateral Lower Extremities Avoid standing for long periods of time. Moisturize legs daily. - as needed Off-Loading Other: - keep pressure off of the bottom of your feet. Elevate legs throughout the day. Use the Shoe with the PegAssist off-loading insole Additional Orders / Instructions Follow Nutritious Diet - Try [name] get 70-100 grams of Protein a day+ Wound Treatment Wound #2 - Calcaneus Wound Laterality: Left Cleanser: Normal Saline (Generic) 1 x Per Day/30 Days Discharge Instructions: Cleanse the wound with Normal Saline prior [name] applying a clean dressing using  gauze sponges, not tissue or cotton balls. Cleanser: Wound Cleanser (Generic) 1 x Per Day/30 Days Discharge Instructions: Cleanse the wound with wound cleanser prior [name] applying a clean dressing using gauze sponges, not tissue or cotton balls. Peri-Wound Care: Ketoconazole Cream 2% 1 x Per Day/30 Days Discharge Instructions: Apply Ketoconazole as directed Peri-Wound Care: Zinc Oxide Ointment 30g tube 1 x Per Day/30 Days Discharge Instructions: Apply Zinc Oxide [name] periwound with each dressing change Topical: Gentamicin 1 x Per Day/30 Days Topical: Ketoconazole Cream 2% 1 x Per Day/30 Days Discharge Instructions: Apply Ketoconazole as directed Topical: compounding topical antibiotics from [facility] 1 x Per Day/30 Days Discharge Instructions: HOLD Prim Dressing: Maxorb Extra Ag+ Alginate Dressing, 4x4.75 (in/in) 1 x Per Day/30 Days ary Discharge Instructions: Apply [name] wound bed as instructed Secondary Dressing: Drawtex 4x4 in (Generic)  1 x Per Day/30 Days Discharge Instructions: Apply over primary dressing as directed. Secondary Dressing: Optifoam Non-Adhesive Dressing, 4x4 in (Generic) 1 x Per Day/30 Days Discharge Instructions: Apply over primary dressing as directed. Secondary Dressing: Woven Gauze Sponge, Non-Sterile 4x4 in 1 x Per Day/30 Days Discharge Instructions: Apply over primary dressing as directed. Secured With: 59M Medipore H Soft Cloth Surgical T ape, 4 x 10 (in/yd) 1 x Per Day/30 Days Discharge Instructions: Secure with tape as directed. Compression Wrap: Kerlix Roll 4.5x3.1 (in/yd) (Generic) 1 x Per Day/30 Days Discharge Instructions: Apply Kerlix and Coban compression as directed. [name], [name] ([id number]) [id number] Page 8 of 17 Add-Ons: [employer] Offloading Boot, Size Regular 1 x Per Day/30 Days Electronic Signature(s) Signed: 02/10/2023 11:26:06 AM By: [name] MD FACS Entered By: [name] on 02/10/2023 11:07:15 -------------------------------------------------------------------------------- Problem List Details Patient Name: Date of Service: [name], [name] [name] 02/10/2023 10:15 A M Medical Record Number: [id number] Patient Account Number: [id number] Date of Birth/Sex: Treating RN: [birth year] (47 y.o. [name] Primary Care Provider: Dorinda Hill Other Clinician: Referring Provider: Treating Provider/Extender: Jana Hakim in Treatment: 134 Active Problems ICD-10 Encounter Code Description Active Date MDM Diagnosis [phone] Non-pressure chronic ulcer of other part of left foot with fat layer exposed 09/03/2020 No Yes Inactive Problems ICD-10 Code Description Active Date Inactive Date L97.512 Non-pressure chronic ulcer of other part of right foot with fat layer exposed 09/03/2020 09/03/2020 L89.893 Pressure ulcer of other site, stage 3 07/15/2020 07/15/2020 M62.81 Muscle weakness (generalized) 07/15/2020 07/15/2020 I10  Essential (primary) hypertension 07/15/2020 07/15/2020 M86.171 Other acute osteomyelitis, right ankle and foot 09/03/2020 09/03/2020 Resolved Problems Electronic Signature(s) Signed: 02/10/2023 10:59:36 AM By: [name] MD FACS Entered By: [name] on 02/10/2023 10:59:36 -------------------------------------------------------------------------------- Progress Note Details Patient Name: Date of Service: [name], [name] [place] E. 02/10/2023 10:15 A M Medical Record Number: [id number] Patient Account Number: [id number] EDMAN, LIPSEY ([id number]) [phone].pdf Page 9 of 17 Date of Birth/Sex: Treating RN: 1976-01-19 (47 y.o. M) Primary Care Provider: Other Clinician: Dorinda Hill Referring Provider: Treating Provider/Extender: Jana Hakim in Treatment: 134 Subjective Chief Complaint Information obtained from Patient Bilateral Plantar Foot Ulcers History of Present Illness (HPI) Wounds are12/03/2020 upon evaluation today patient presents for initial inspection here in our clinic concerning issues he has been having with the bottoms of his feet bilaterally. He states these actually occurred as wounds when he was hospitalized for 5 months secondary [name] Covid. He was apparently with tilting bed where he was in an upright position quite frequently and apparently this occurred in some way shape or form during that time. Fortunately there is  no sign of active infection at this time. No fevers, chills, nausea, vomiting, or diarrhea. With that being said he still has substantial wounds on the plantar aspects of his feet Theragen require quite a bit of work [name] get these [name] heal. He has been using Santyl currently though that is been problematic both in receiving the medication as well as actually paid for it as it is become quite expensive. Prior [name] the experience with Covid the patient really did not have any major medical problems other than hypertension  he does have some mild generalized weakness following the Covid experience. 07/22/2020 on evaluation today patient appears [name] be doing okay in regard [name] his foot ulcers I feel like the wound beds are showing signs of better improvement that I do believe the Iodoflex is helping in this regard. With that being said he does have a lot of drainage currently and this is somewhat blue/green in nature which is consistent with Pseudomonas. I do think a culture today would be appropriate for [place] [name] evaluate and see if that is indeed the case I would likely start him on antibiotic orally as well he is not allergic [name] Cipro knows of no issues he has had in the past 12/21; patient was admitted [name] the clinic earlier this month with bilateral presumed pressure ulcers on the bottom of his feet apparently related [name] excessive pressure from a tilt table arrangement in the intensive care unit. Patient relates this [name] being on ECMO but I am not really sure that is exactly related [name] that. I must say I have never seen anything like this. He has fairly extensive full-thickness wounds extending from his heel towards his midfoot mostly centered laterally. There is already been some healing distally. He does not appear [name] have an arterial issue. He has been using gentamicin [name] the wound surfaces with Iodoflex [name] help with ongoing debridement 1/6; this is a patient with pressure ulcers on the bottom of his feet related [name] excessive pressure from a standing position in the intensive care unit. He is complaining of a lot of pain in the right heel. He is not a diabetic. 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 We have been using Iodoflex [name] help prepare the surfaces of both wounds for an advanced treatment product. He is nonambulatory spending most of his time in a wheelchair I have asked him not [name] propel the wheelchair with his heels 1/13; in general his wounds look better not much surface area change we  have been using Iodoflex as of last week. I did an x-ray of the right heel as the patient was complaining of pain. I had some thoughts about a stress fracture perhaps Achilles tendon problems however what it showed was erosive changes along the inferior aspect of the calcaneus he now has a MRI booked for 1/20. 1/20; in general his wounds continue [name] be better. Some improvement in the large narrow areas proximally in his foot. He is still complaining of pain in the right heel and tenderness in certain areas of this wound. His MRI is tonight. I am not just looking for osteomyelitis that was brought up on the x-ray I am wondering about stress fractures, tendon ruptures etc. He has no such findings on the left. Also noteworthy is that the patient had critical illness neuropathy and some of the discomfort may be actual improvement in nerve function I am just not sure. These wounds were initially in the setting of  severe critical illness related [name] COVID-19. He was put in a standing position. He may have also been on pressors at the point contributing [name] tissue ischemia. By his description at some point these wounds were grossly necrotic extending proximally up into the Achilles part of his heel. I do not know that I have ever really seen pictures of them like this although they may exist in epic We have ordered Tri layer Oasis. I am trying [name] stimulate some granulation in these areas. This is of course assuming the MRI is negative for infection 1/27; since the patient was last here he saw Dr. Earlene Plater of infectious disease. He is planned for vancomycin and ceftriaxone. Prior operative culture grew MSSA. Also ordered baseline lab work. He also ordered arterial studies although the ABIs in our clinic were normal as well as his clinical exam these were normal I do not think he needs [name] see vascular surgery. His ABIs at the [facility] were 1.22 in the right triphasic waveforms with a normal TBI of 1.15 on the left ABI  of 1.22 with triphasic waveforms and a normal TBI of 1.08. Finally he saw Dr. Logan Bores who will follow him in for 2 months. At this point I do not think he felt that he needed a procedure on the right calcaneal bone. Dr. Earlene Plater is elected for broad-spectrum antibiotic The patient is still having pain in the right heel. He walks with a walker 2/3; wounds are generally smaller. He is tolerating his IV antibiotics. I believe this is vancomycin and ceftriaxone. We are still waiting for Oasis burn in terms of his out-of-pocket max which he should be meeting soon given the IV antibiotics, MRIs etc. I have asked him [name] check in on this. We are using silver collagen in the meantime the wounds look better 2/10; tolerating IV vancomycin and Rocephin. We are waiting [name] apply for Oasis. Although I am not really sure where he is in his out-of-pocket max. 2/17 started the first application of Oasis trilayer. Still on antibiotics. The wounds have generally look better. The area on the left has a little more surface slough requiring debridement 2/24; second application of Oasis trilayer. The wound surface granulation is generally look better. The area on the left with undermining laterally I think is come in a bit. 10/08/2020 upon evaluation today patient is here today for [employer] application #3. Fortunately he seems [name] be doing extremely well with regard [name] this and we are seeing a lot of new epithelial growth which is great news. Fortunately there is no signs of active infection at this time. 10/16/2020 upon evaluation today patient appears [name] be doing well with regard [name] his foot ulcers. Do believe the Oasis has been of benefit for him. I do not see any signs of infection right now which is great news and I think that he has a lot of new epithelial growth which is great [name] see as well. The patient is very pleased [name] hear all of this. I do think we can proceed with the Oasis trilayer #4 today. 3/18; not as  much improvement in these areas on his heels that I was hoping. I did reapply trilateral Oasis today the tissue looks healthier but not as much fill in as I was hoping. 3/25; better looking today I think this is come in a bit the tissue looks healthier. Triple layer Oasis reapplied #6 4/1; somewhat better looking definitely better looking surface not as much change in surface area as  I was hoping. He may be spending more time [place] on days then he needs [name] although he does have heel offloading boots. Triple layer Oasis reapplied #7 4/7; unfortunately apparently [facility] will not approve any further Oasis which is unfortunate since the patient did respond nicely both in terms of the condition of the wound bed as well as surface area. There is still some drainage coming from the wound but not a lot there does not appear [name] be any infection 4/15; we have been using Hydrofera Blue. He continues [name] have nice rims of epithelialization on the right greater than the left. The left the epithelialization is coming from the tip of his heel. There is moderate drainage. In this that concerns me about a total contact cast. There is no evidence of infection 4/29; patient has been using Hydrofera Blue with dressing changes. He has no complaints or issues today. 5/5; using Hydrofera Blue. I actually think that he looks marginally better than the last time I saw this 3 weeks ago. There are rims of epithelialization on the left thumb coming from the medial side on the right. Using [address] AZAN, MANERI ([id number]) [id number] Page 10 of 17 5/12; using Hydrofera Blue. These continue [name] make improvements in surface area. His drainage was not listed as severe I therefore went ahead and put a cast on the left foot. Right foot we will continue [name] dress his previous 5/16; back for first total contact cast change. He did not tolerate this particularly well cast injury on the  anterior tibia among other issues. Difficulty sleeping. I talked him about this in some detail and afterwards is elected [name] continue. I told him I would like [name] have a cast on for 3 weeks [name] see if this is going [name] help at all. I think he agreed 5/19; I think the wound is better. There is no tunneling towards his midfoot. The undermining medially also looks better. He has a rim of new skin distally. I think we are making progress here. The area on the left also continues [name] look somewhat better [name] me using Hydrofera Blue. He has a list of complaints about the cast but none of them seem serious 5/26; patient presents for 1 week follow-up. He has been using a total contact cast and tolerating this well. Hydrofera Blue is the main dressing used. He denies signs of infection. 6/2 Hydrofera Blue total contact cast on the left. These were large ulcers that formed in intensive care unit where the patient was recovering from COVID. May have had something to do with being ventilated in an upright positiono Pressors etc. We have been able [name] get the areas down considerably and a viable surface. There is some epithelialization in both sides. Note made of drainage 6/9; changed [name] last time because of drainage. He arrives with better looking surfaces and dimensions on the left than the right. Paradoxically the right actually probes more towards his midfoot the left is largely close down but both of these look improved. Using a total contact cast on the left 6/16; complex wounds on his bilateral plantar heels which were initially pressure injury from a stay in the ICU with COVID. We have been using silver alginate most recently. His dimensions of come in quite dramatically however not recently. We have been putting the left foot in a total contact cast 6/23; complex wounds on the bilateral plantar heels. I been putting the left in the cast  paradoxically the area on the right is the one that is going  towards closure at a faster rate. Quite a bit of drainage on the left. The patient went [name] see Dr. Logan Bores who said he was going [name] standby for skin grafts. I had actually considered sending him for skin grafts however he would be mandatorily off his feet for a period of weeks [name] months. I am thinking that the area on the right is going [name] close on its own the area on the left has been more stubborn even though we have him in a total contact cast 6/30; took him out of a total contact cast last week is the right heel seem [name] be making better progress than the left where I was placing the cast. We are using silver alginate. Both wounds are smaller right greater than left 7/12; both wounds look as though they are making some progress. We are using silver alginate. Heel offloading boots 7/26; very gradual progress especially on the right. Using silver alginate. He is wearing heel offloading boots 8/18; he continues [name] close these wounds down very gradually. Using silver alginate. The problem polymen being definitive about this is areas of what appears [name] be callus around the margins. This is not a 100% of the area but certainly sizable especially on the right 9/1; bilateral plantar feet wounds secondary [name] prolonged pressure while being ventilated for COVID-19 in an upright position. Essentially pressure ulcers on the bottom of his feet. He is made substantial progress using silver alginate. 9/14; bilateral plantar feet wounds secondary [name] prolonged pressure. Making progress using silver alginate. 9/29 bilateral plantar feet wounds secondary [name] prolonged pressure. I changed him [name] Iodoflex last week. MolecuLight showing reddened blush fluorescence 10/11; patient presents for follow-up. He has no issues or complaints today. He denies signs of infection. He continues [name] use Iodoflex and antibiotic ointment [name] the wound beds. 10/27; 2-week follow-up. No evidence of infection. He has callus and thick dry skin  around the wound margins we have been using Iodoflex and Bactroban which was in response [name] a moderate left MolecuLight reddish blush fluorescence. 11/10; 2-week follow-up. Wound margins again have thick callus however the measurements of the actual wound sites are a lot smaller. Everything looks reasonably healthy here. We have been using Iodoflex He was approved for prime matrix but I have elected [name] delay this given the improvement in the surface area. Hopefully I will not regret that decision as were getting close [name] the end of the year in terms of insurance payment 12/8; 2-week follow-up. Wounds are generally smaller in size. These were initially substantial wounds extending into the forefoot all the way into the heel on the bilateral plantar feet. They are now both located on the plantar heel distal aspect both of these have a lot of callus around the wounds I used a #5 curette [name] remove this on the right and the left also some subcutaneous debris [name] try and get the wound edges were using Iodoflex. He has heel offloading shoe 12/22; 2-week follow-up. Not really much improvement. He has thick callus around the outer edges of both wounds. I remove this there is some nonviable subcutaneous tissue as well. We have been using Iodoflex. Her intake nurse and myself spontaneously thought of a total contact cast I went back in May. At that time we really were not seeing much of an improvement with a cast although the wound was in a much different situation I would like  [name] retry this in 2 weeks and I discussed this with the patient 08/12/2021; the patient has had some improvement with the Iodoflex. The the area on the left heel plantar more improved than the right. I had [name] put him in a total contact cast on the left although I decided [name] put that off for 2 weeks. I am going [name] change his primary dressing [name] silver collagen. I think in both areas he has had some improvement most of the healing seems [name] be  more proximal in the heel. The wounds are in the mid aspect. A lot of thick callus on the right heel however. 1/19; we are using silver collagen on both plantar heel areas. He has had some improvement today. The left did not require any debridement. He still had some eschar on the right that was debrided but both seem [name] have contracted. I did not put it total contact cast on him today 2/2 we have been using silver collagen. The area on the right plantar heel has areas that appear [name] be epithelialized interspersed with dry flaking callus and dry skin. I removed this. This really looks better than on the other side. On the left still a large area with raised edges and debris on the surface. 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 He has no issues or complaints today. 2/9; not much change in the left foot wound with 1 week of a cast we are using silver collagen. Silver collagen on the right side. The right side has been the better wound surface. We will reapply the total contact cast on the left 2/16; not much improvement on either side I been using silver collagen with a total contact cast on the left. I'm changing the Hydrofera Blue still with a total contact cast on the left 2/23; some improvement on both sides. Disappointing that he has thick callus around the area that we are putting in a total contact cast on the left. We've been using Hydrofera Blue on both wound areas. This is a man who at essentially pressure ulcers in addition [name] ischemia caused by medications [name] support his blood pressure (pressors) in the ICU. He was being ventilated in the standing position for severe Covid. A Shiley the wounds extended across his entire foot but are now localized [name] his plantar heels bilaterally. We have made progress however neither areas healed. I continue [name] think the  total contact cast is helped albeit painstakingly slowly. He has never wanted a plastic surgery consult although I don't know that they would be interested in grafting in area in this location. 10/07/2021: Continued improvement bilaterally. There is still some callus around the left wound, despite the total contact cast. He has some increased pain in his right midfoot around 1 particular area. This has been painful in the past but seems [name] be a little bit worse. When his cast was removed today, there was an area on the heel of the left foot that looks a bit macerated. He is also complaining of pain in his left thigh and hip which he thinks is secondary [name] the limb length discrepancy caused by the cast. 10/14/2021: He continues [name] improve. A little bit less callus around the left wound. He continues [name] endorse pain in his right midfoot, but this is not as significant as it was last week. The  maceration on his left heel is improved. 10/21/2021: Continued improvement [name] both wounds. The maceration on his left heel is no longer evident. Less callus bilaterally. Epithelialization progressing. 10/28/2021: Significant improvement this week. The right sided wound is nearly closed with just a small open area at the middle. No maceration seen on the left heel. Continued epithelialization on both sides. No concern for infection. 11/04/2021: T oday, the wounds were measured a little bit differently and come out as larger, but I actually think they are about the same [name] potentially even smaller, particularly on the left. He continues [name] accumulate some callus on the right. [name], [name] ([id number]) [id number] Page 11 of 17 11/11/2021: T oday, the patient is expressing some concern that the left wound, despite being in the total contact cast, is not progressing at the same rate as the 1 on the right. He is interested in trying a week without the cast [name] see how the wound does. The wounds are  roughly the same size as last week, with the right perhaps being a little bit smaller. He continues [name] build up callus on both sites. 11/18/2021: Last week, I permitted the patient [name] go without his total contact cast, just [name] see if the cast was really making any difference. Today, both wounds have deteriorated [name] some extent, suggesting that the cast is providing benefit, at least on the left. Both are larger and have accumulated callus, slough, and other debris. 11/26/2021: I debrided both wounds quite aggressively last week in an effort [name] stimulate the healing cascade. This appears [name] have been effective as the left sided wound is a full centimeter shorter in length. Although the right was measured slightly larger, on inspection, it looks as though an area of epithelialized tissue was included in the measurements. We have been using PolyMem Ag on the wound surfaces with a total contact cast [name] the left. 12/02/2021: It appears that the intake personnel are including epithelialized tissue in his wound measurements; the right wound is almost completely epithelialized; there is just a crater at the proximal midfoot with some open areas. On the left, he has built up some callus, but the overall wound surface looks good. There is some senescent skin around the wound margin. He has been in PolyMem Ag bilaterally with a total contact cast on the left. 12/09/2021: The right wound is nearly closed; there is just a small open area at the mid calcaneus. On the left, the wound is smaller with minimal callus buildup. No significant drainage. 12/16/2021: The right calcaneal wound remains minimally open at the mid calcaneus; the rest has epithelialized. On the left, the wound is also a little bit smaller. There is some senescent tissue on the periphery. He is getting his first application of a trial skin substitute called Vendaje today. 12/23/2021: The wound on his right calcaneus is nearly closed; there is just a  small area at the most distal aspect of the calcaneus that is open. On the left, the area where we applied [name] the skin substitute has a healthier-looking bed of granulation tissue. The wound dimensions are not significantly different on this side but the wound surface is improved. 12/30/2021: The wound on the right calcaneus has not changed significantly aside from some accumulation of callus. On the left, the open area is smaller and continues [name] have an improved surface. He continues [name] accumulate callus around the wound. He is here for his third application of Vendaje. 01/06/2022: The right calcaneal wound is  down [name] just a couple of millimeters. He continues [name] accumulate periwound callus. He unfortunately got his cast wet earlier in the week and his left foot is macerated, resulting in some superficial skin loss just distal [name] the open wound. The open wound itself, however, is much smaller and has a healthier appearing surface. He is here for his fourth application of Vendaje. 01/13/2022: The right calcaneal wound is about the same. Unfortunately, once again, his cast got wet and his foot is again macerated. This is caused the left calcaneal wound [name] enlarge. He is here for his fifth application of Vendaje. 01/20/2022: The right calcaneal wound is very small. There is some periwound callus accumulation. He purchased a new cast protector last week and this has been effective in avoiding the maceration that has been occurring on the left. The left calcaneal wound is narrower and has a healthy and viable-appearing surface. He is here for his 6 application of Vendaje. 01/27/2022: The right calcaneal wound is down [name] just a pinhole. There is some periwound slough and callus. On the left, the wound is narrower and shorter by about a centimeter. The surface is robust and viable-appearing. Unfortunately, the rep for the trial skin substitute product did not provide any for [place] [name] use today. 02/04/2022: The right  calcaneal wound remains unchanged. There is more accumulated callus. On the left, although the intake nurse measured it a little bit longer, it looks about the same [name] me. The surface has a layer of slough, but underneath this, there is good granulation tissue. 02/10/2022: The right calcaneus wound is nearly closed. There is still some callus that builds up around the site. The left side looks about the same in terms of dimensions, but the surface is more robust and vital-appearing. 02/16/2022: The area of the right calcaneus that was nearly closed last week has closed, but there is a small opening at the mid foot where it looks like some moisture got retained and caused some reopening. The left foot wound is narrower and shallower. Both sites have a fair amount of periwound callus and eschar. 02/24/2022: The small midfoot opening on the right calcaneus is a little bit smaller today. The left foot wound is narrower and shallower. He continues [name] accumulate periwound callus. No concern for infection. 03/01/2022: The patient came [name] clinic early because he showered and got his cast wet. Fortunately, there is no significant maceration [name] his foot but the callus softened and it looks like the wound on his left calcaneus may be a little bit wider. The wound on his right calcaneus is just a narrow slit. Continued accumulation of periwound callus bilaterally. 03/08/2022: The wound on his right calcaneus is very nearly closed, just a small pinpoint opening under a bit of eschar; the left wound has come in quite a bit, as well. It is narrower and shorter than at our last visit. Still with accumulated callus and eschar bilaterally. 03/17/2022: The right calcaneal wound is healed. The left wound is smaller and the surface itself is very clean, but there is some blue-green staining on the periwound callus, concerning for Pseudomonas aeruginosa. 03/23/2022: The right calcaneal wound remains closed. The left wound continues  [name] contract. No further blue-green staining. Small amount of callus and slough accumulation. 03/28/2022: He came in early today because he had gotten his cast wet. On inspection, the wound itself did not get wet or macerated, just a little bit of the forefoot. The wound itself is basically unchanged. 04/07/2022:  The right foot wound remains closed. The left wound is the smallest that I have seen it [name] date. It is narrower and shorter. It still continues [name] accumulate slough on the surface. 04/15/2022: There is a band of epithelium now dividing the small left plantar foot wound in 2. There is still some slough on the surface. 04/21/2022: The wound continues [name] narrow. Just a little bit of slough on the surface. He seems [name] be responding well [name] endoform. 04/28/2022: Continued slow contraction of the wound. There is a little slough on the surface and some periwound callus. We have been using endoform and total contact cast. 05/05/2022: The wound appears [name] have stalled. There is slough and some periwound eschar/callus. No concern for infection, however. 05/12/2022: Unfortunately, his right foot has reopened. It is located at the most posterior aspect of his surgical incision. The area was noted [name] have drainage coming from it when his padding was removed today. Underneath some callus and senescent skin, there is an opening. No purulent drainage or malodor. On the left foot, the wound is again unchanged. There is some light blue staining on the callus, but no malodor or purulent drainage. 10/13; right and left heel remanence of extensive plantar foot wounds. These are better than I remember by quite a big margin however he is still left with wounds on the left plantar heel and the right plantar heel. Been using endoform bilaterally. A culture was done that showed apparently Pseudomonas but we are still waiting for the [facility] antibiotic [name] use gentamicin today. He is still very active by description I am not  sure about the offloading of his noncasted right [name], [name] ([id number]) [id number] Page 12 of 17 foot 10/20; both wounds right and left heel debrided not much change from last week. [name] Echevaria has arrived which is linezolid, gentamicin and ciprofloxacin we will use this with endoform. T contact cast on the left otal 06/02/2022: Both wounds are smaller today. There is still a fair amount of callus buildup around the right foot ulcer. The left is more superficial and nearly flush with the surrounding tissues. Also with slough and eschar buildup. 06/10/2022: The right sided wound appears [name] be nearly closed, if not completely so, although it is somewhat difficult [name] tell given the abnormal tissue and scarring in his foot. There is a fair amount of callus and crust accumulation. On the left, the wound looks about the same, again with callus and slough. He has an appointment next Thursday with Dr. Annamary Rummage in podiatry; I am hopeful that there may be some reconstructive options available for Mr. Labell. 06/16/2022: Both wounds have some eschar and callus accumulation. The right sided wound is extremely narrow and barely open; the left is narrower than last week. There is a little bit of slough. He has his appointment in podiatry later today. 06/23/2022: The patient met with Dr. Annamary Rummage last week and unfortunately, there are no reconstructive options that he believes would be helpful. He did order an MRI [name] evaluate for osteomyelitis and fortunately, none was seen. The left sided wound is a little bit shorter and narrower today. The right sided wound is about the same. There is callus and eschar accumulation bilaterally. 06/29/2022: Both feet have improved from last week. There is epithelium making a valiant effort [name] creep across the surface on the left. The right side looks like it got a little dry and the deep crevasse in his midfoot has cracked. Both have eschar and  there  is some slough on the left. 07/07/2022: Both feet have improved. There is epithelium completely covering the calcaneus on the right with just a small opening in the crevasse in his midfoot. On the left, the open area of tissue is smaller but he continues [name] build up callus/eschar and slough. 07/15/2022: The opening in the midfoot on the right is about the same size, covered with eschar and a little bit of slough. The open portion of the left wound is narrower and shorter with a bit of slough buildup. He admits [name] being on his feet more than recommended. 12/14; as far as I can tell everything on the right foot is closed. There is some eschar I removed some of this I cannot identify any open wound here. As usual this will be a very vulnerable area going forward. On the left this looks really quite healthy. I was pleasantly surprised [name] see how good this looked. Wound is certainly smaller and there appears [name] be healthy epithelialization. He has been using Promogran on the right and endoform on the left. He has been offloading the right foot with a heel offloading boot and he has a running shoe on the right foot 08/04/2022: The right foot remains closed. He has a thick cushioned insole in his sneaker. The left sided wound is smaller with just some slough and eschar accumulation. He is wearing the heel off loader on this foot. 08/15/2022: The right foot remains closed. The left wound has narrowed further. There is some slough and eschar accumulation. 08/25/2022: We put him in a peg assist shoe insert and as a result, he has more epithelialization of the ulcer with minimal slough and eschar accumulation. 09/01/2022: The wound is smaller by about half this week. Still with some slough on the surface. The peg assist shoe seems [name] be doing a remarkable job of adequately offloading the site. 09/08/2022: There is a little bit more epithelium coming in. There is some slough and callus buildup. 09/16/2022: The  wound measures about the same size, but the epithelium that has grown in looks more robust and stronger. There is some slough and callus buildup. 09/23/2022: The wound remains about the same size. The skin edges are looking rather senescent. 09/29/2022: The aggressive debridement I performed last week seems [name] have been effective. The wound is smaller and has significantly less slough accumulation. 10/06/2022: There has been quite a bit of epithelialization since last week. There are still some open areas with slough accumulation. There is some callus buildup around the perimeter. 10/13/2022: Continued improvement. Minimal callus accumulation. 3/14; patient presents for follow-up. He has been using endoform [name] the wound bed without issues. He is using a surgical shoe with peg assist for offloading. 10/27/2022: The wound dimensions are stable. There is some senescent skin accumulation around the perimeter. 11/04/2022: Last week I performed a very aggressive debridement in an effort [name] stimulate the healing cascade. As has been the case when I have done this before, the wound has responded well. There has been epithelialization and contraction of the wound. There are just a couple of small open areas. 11/10/2022: Unfortunately, his foot got wet secondary [name] sweating and there has been some breakdown of the tissue, particularly the skin just adjacent [name] the main ulcer. 11/18/2022: We continue [name] struggle with moisture-related tissue breakdown around the perimeter of his wound. This has caused the thin epithelium that had formed on the surface [name] disintegrate. The underlying surface of the wound has good granulation tissue.  11/25/2022: The edges of the wound are much less macerated, but the surface is actually looking a little bit dry. There is slough accumulation. No obvious signs of infection. 12/01/2022: The wound edges are more macerated and broken down, making the wound larger. The surface has some slough on  it. 12/08/2020: The wound looks better this week. There is significantly less maceration and moisture-related tissue breakdown. There is some slough on the wound surface. 12/15/2022: The wound continues [name] improve. There is almost no moisture-related tissue breakdown. There is epithelium beginning [name] fill in again from the edges. Light slough on the wound surface. 12/22/2022: More epithelium has filled him from around the edges. There is some slough on the surface. No evidence of moisture-related tissue breakdown. 01/05/2023: There has been more epithelialization, particularly at the posterior calcaneal aspect of the wound. There has been a little bit of moisture accumulation with minimal maceration. 01/12/2023: More epithelium has filled in. There is very minimal accumulation of slough. 01/20/2023: The wound is measuring a little bit smaller today. It did measure deeper, but this appears [name] be secondary [name] some heaped up senescent skin around the edges. The epithelium that has been present at the posterior aspect of the wound seems [name] be a bit more robust with greater integrity. [name], [name] ([id number]) [id number] Page 13 of 17 01/26/2023: He reports intense itching [name] the skin around the wound and there has been some breakdown here. It looks fungal in nature. The wound itself looks pretty good with improved epithelialization with just some slough buildup. He has a little callus along the wound edges. 02/02/2023: He has been treating the periwound skin with an over-the-counter athlete's foot medication and notes significant improvement in his pruritus. The skin looks better here, as well. The wound continues [name] epithelialize. There is light slough accumulation. 02/10/2023: The periwound skin still looks fairly angry. He reports multiple small blisters that have opened up. He is not having the intense itching that he had prior, however. There has been more epithelialization at the  calcaneus and there is minimal slough accumulation. Patient History Information obtained from Patient. Family History Cancer - Maternal Grandparents, Diabetes - Father,Paternal Grandparents, Heart Disease - Maternal Grandparents, Hypertension - Father,Paternal Grandparents, Lung Disease - Siblings, No family history of Hereditary Spherocytosis, Kidney Disease, Seizures, Stroke, Thyroid Problems, Tuberculosis. Social History Never smoker, Marital Status - Married, Alcohol Use - Never, Drug Use - No History, Caffeine Use - Daily - tea, soda. Medical History Eyes Denies history of Cataracts, Glaucoma, Optic Neuritis Ear/Nose/Mouth/Throat Denies history of Chronic sinus problems/congestion, Middle ear problems Hematologic/Lymphatic Denies history of Anemia, Hemophilia, Human Immunodeficiency Virus, Lymphedema, Sickle Cell Disease Respiratory Patient has history of Asthma Denies history of Aspiration, Chronic Obstructive Pulmonary Disease (COPD), Pneumothorax, Sleep Apnea, Tuberculosis Cardiovascular Patient has history of Angina - with COVID, Hypertension Denies history of Arrhythmia, Congestive Heart Failure, Coronary Artery Disease, Deep Vein Thrombosis, Hypotension, Myocardial Infarction, Peripheral Arterial Disease, Peripheral Venous Disease, Phlebitis, Vasculitis Gastrointestinal Denies history of Cirrhosis , Colitis, Crohns, Hepatitis A, Hepatitis B, Hepatitis C Endocrine Denies history of Type I Diabetes, Type II Diabetes Genitourinary Denies history of End Stage Renal Disease Immunological Denies history of Lupus Erythematosus, Raynauds, Scleroderma Integumentary (Skin) Denies history of History of Burn Musculoskeletal Denies history of Gout, Rheumatoid Arthritis, Osteoarthritis, Osteomyelitis Neurologic Denies history of Dementia, Neuropathy, Quadriplegia, Paraplegia, Seizure Disorder Oncologic Denies history of Received Chemotherapy, Received Radiation Psychiatric Denies  history of Anorexia/bulimia, Confinement Anxiety Hospitalization/Surgery History - COVID PNA 07/22/2019- 11/14/2019. -  03/27/2020 wound debridement/ skin graft. Medical A Surgical History Notes nd Constitutional Symptoms (General Health) COVID PNA 07/22/2019-11/14/2019 [facility] ECMO, foot drop left foot , Genitourinary kidney stone Psychiatric anxiety Objective Constitutional no acute distress. Vitals Time Taken: 10:19 AM, Height: 69 in, Weight: 280 lbs, BMI: 41.3, Temperature: 98.8 F, Pulse: 99 bpm, Respiratory Rate: 20 breaths/min, Blood Pressure: 131/76 mmHg. Respiratory Normal work of breathing on room air. General Notes: 02/10/2023: The periwound skin still looks fairly angry. He reports multiple small blisters that have opened up. He is not having the intense itching that he had prior, however. There has been more epithelialization at the calcaneus and there is minimal slough accumulation. [name], [name] ([id number]) [id number] Page 14 of 17 Integumentary (Hair, Skin) Wound #2 status is Open. Original cause of wound was Pressure Injury. The date acquired was: 10/07/2019. The wound has been in treatment 134 weeks. The wound is located on the Left Calcaneus. The wound measures 3.2cm length x 0.8cm width x 0.2cm depth; 2.011cm^2 area and 0.402cm^3 volume. There is Fat Layer (Subcutaneous Tissue) exposed. There is no tunneling or undermining noted. There is a medium amount of serosanguineous drainage noted. The wound margin is distinct with the outline attached [name] the wound base. There is medium (34-66%) red, pink granulation within the wound bed. There is a medium (34-66%) amount of necrotic tissue within the wound bed including Adherent Slough. The periwound skin appearance exhibited: Dry/Scaly, Hemosiderin Staining. The periwound skin appearance did not exhibit: Callus, Crepitus, Excoriation, Induration, Rash, Scarring, Maceration, Atrophie Blanche, Cyanosis,  Ecchymosis, Mottled, Pallor, Rubor, Erythema. Periwound temperature was noted as No Abnormality. Assessment Active Problems ICD-10 Non-pressure chronic ulcer of other part of left foot with fat layer exposed Procedures Wound #2 Pre-procedure diagnosis of Wound #2 is a Pressure Ulcer located on the Left Calcaneus . There was a Selective/Open Wound Non-Viable Tissue Debridement with a total area of 2.01 sq cm performed by [name], MD. With the following instrument(s): Curette [name] remove Non-Viable tissue/material. Material removed includes Callus and Slough and after achieving pain control using Lidocaine 4% T opical Solution. No specimens were taken. A time out was conducted at 10:40, prior [name] the start of the procedure. A Minimum amount of bleeding was controlled with Pressure. The procedure was tolerated well with a pain level of 0 throughout and a pain level of 0 following the procedure. Post Debridement Measurements: 3.2cm length x 0.8cm width x 0.2cm depth; 0.402cm^3 volume. Post debridement Stage noted as Category/Stage III. Character of Wound/Ulcer Post Debridement is improved. Post procedure Diagnosis Wound #2: Same as Pre-Procedure General Notes: Scribed for Dr Lady Gary by Brenton Grills RN.Marland Kitchen Plan Follow-up Appointments: Return Appointment in 1 week. - Dr. Lady Gary Room 3 Anesthetic: Wound #2 Left Calcaneus: (In clinic) Topical Lidocaine 4% applied [name] wound bed - In clinic Bathing/ Shower/ Hygiene: May shower and wash wound with soap and water. Edema Control - Lymphedema / SCD / Other: Avoid standing for long periods of time. Moisturize legs daily. - as needed Off-Loading: Other: - keep pressure off of the bottom of your feet. Elevate legs throughout the day. Use the Shoe with the PegAssist off-loading insole Additional Orders / Instructions: Follow Nutritious Diet - Try [name] get 70-100 grams of Protein a day+ WOUND #2: - Calcaneus Wound Laterality: Left Cleanser: Normal  Saline (Generic) 1 x Per Day/30 Days Discharge Instructions: Cleanse the wound with Normal Saline prior [name] applying a clean dressing using gauze sponges, not tissue or cotton balls. Cleanser: Wound Cleanser (Generic)  1 x Per Day/30 Days Discharge Instructions: Cleanse the wound with wound cleanser prior [name] applying a clean dressing using gauze sponges, not tissue or cotton balls. Peri-Wound Care: Ketoconazole Cream 2% 1 x Per Day/30 Days Discharge Instructions: Apply Ketoconazole as directed Peri-Wound Care: Zinc Oxide Ointment 30g tube 1 x Per Day/30 Days Discharge Instructions: Apply Zinc Oxide [name] periwound with each dressing change Topical: Gentamicin 1 x Per Day/30 Days Topical: Ketoconazole Cream 2% 1 x Per Day/30 Days Discharge Instructions: Apply Ketoconazole as directed Topical: compounding topical antibiotics from [facility] pharmacy 1 x Per Day/30 Days Discharge Instructions: HOLD Prim Dressing: Maxorb Extra Ag+ Alginate Dressing, 4x4.75 (in/in) 1 x Per Day/30 Days ary Discharge Instructions: Apply [name] wound bed as instructed Secondary Dressing: Drawtex 4x4 in (Generic) 1 x Per Day/30 Days Discharge Instructions: Apply over primary dressing as directed. Secondary Dressing: Optifoam Non-Adhesive Dressing, 4x4 in (Generic) 1 x Per Day/30 Days Discharge Instructions: Apply over primary dressing as directed. Secondary Dressing: Woven Gauze Sponge, Non-Sterile 4x4 in 1 x Per Day/30 Days Discharge Instructions: Apply over primary dressing as directed. Secured With: 29M Medipore H Soft Cloth Surgical T ape, 4 x 10 (in/yd) 1 x Per Day/30 Days Discharge Instructions: Secure with tape as directed. Com pression Wrap: Kerlix Roll 4.5x3.1 (in/yd) (Generic) 1 x Per Day/30 Days Discharge Instructions: Apply Kerlix and Coban compression as directed. Add-Ons: [employer] Offloading Boot, Size Regular 1 x Per Day/30 Days [name], MCNEAL ([id number]) [id number].pdf Page 15 of  17 02/10/2023: The periwound skin still looks fairly angry. He reports multiple small blisters that have opened up. He is not having the intense itching that he had prior, however. There has been more epithelialization at the calcaneus and there is minimal slough accumulation. I used a curette [name] debride slough and callus from his wound. I am not entirely sure what is going on with the periwound skin but I wonder if it might be coxsackievirus, which should resolve on its own with time. We will continue [name] apply the mixture of ketoconazole and gentamicin [name] the wound surface. I have asked him [name] apply this mixture plus the addition of zinc oxide [name] the periwound skin. Continue silver alginate. Follow-up in 1 week. Electronic Signature(s) Signed: 02/10/2023 11:08:32 AM By: [name] MD FACS Entered By: [name] on 02/10/2023 11:08:32 -------------------------------------------------------------------------------- HxROS Details Patient Name: Date of Service: [name], [name] [place] E. 02/10/2023 10:15 A M Medical Record Number: [id number] Patient Account Number: [id number] Date of Birth/Sex: Treating RN: 01-Apr-1976 (47 y.o. M) Primary Care Provider: Dorinda Hill Other Clinician: Referring Provider: Treating Provider/Extender: Jana Hakim in Treatment: 134 Information Obtained From Patient Constitutional Symptoms (General Health) Medical History: Past Medical History Notes: COVID PNA 07/22/2019-11/14/2019 V[facility] ECMO, foot drop left foot , Eyes Medical History: Negative for: Cataracts; Glaucoma; Optic Neuritis Ear/Nose/Mouth/Throat Medical History: Negative for: Chronic sinus problems/congestion; Middle ear problems Hematologic/Lymphatic Medical History: Negative for: Anemia; Hemophilia; Human Immunodeficiency Virus; Lymphedema; Sickle Cell Disease Respiratory Medical History: Positive for: Asthma Negative for: Aspiration; Chronic Obstructive Pulmonary Disease  (COPD); Pneumothorax; Sleep Apnea; Tuberculosis Cardiovascular Medical History: Positive for: Angina - with COVID; Hypertension Negative for: Arrhythmia; Congestive Heart Failure; Coronary Artery Disease; Deep Vein Thrombosis; Hypotension; Myocardial Infarction; Peripheral Arterial Disease; Peripheral Venous Disease; Phlebitis; Vasculitis Gastrointestinal Medical History: Negative for: Cirrhosis ; Colitis; Crohns; Hepatitis A; Hepatitis B; Hepatitis C Endocrine Medical History: Negative for: Type I Diabetes; Type II Diabetes RACIEL, CAFFREY ([id number]) [id number].pdf Page 16 of 17 Genitourinary Medical History:  Negative for: End Stage Renal Disease Past Medical History Notes: kidney stone Immunological Medical History: Negative for: Lupus Erythematosus; Raynauds; Scleroderma Integumentary (Skin) Medical History: Negative for: History of Burn Musculoskeletal Medical History: Negative for: Gout; Rheumatoid Arthritis; Osteoarthritis; Osteomyelitis Neurologic Medical History: Negative for: Dementia; Neuropathy; Quadriplegia; Paraplegia; Seizure Disorder Oncologic Medical History: Negative for: Received Chemotherapy; Received Radiation Psychiatric Medical History: Negative for: Anorexia/bulimia; Confinement Anxiety Past Medical History Notes: anxiety Immunizations Pneumococcal Vaccine: Received Pneumococcal Vaccination: No Implantable Devices None Hospitalization / Surgery History Type of Hospitalization/Surgery COVID PNA 07/22/2019- 11/14/2019 03/27/2020 wound debridement/ skin graft Family and Social History Cancer: Yes - Maternal Grandparents; Diabetes: Yes - Father,Paternal Grandparents; Heart Disease: Yes - Maternal Grandparents; Hereditary Spherocytosis: No; Hypertension: Yes - Father,Paternal Grandparents; Kidney Disease: No; Lung Disease: Yes - Siblings; Seizures: No; Stroke: No; Thyroid Problems: No; Tuberculosis: No; Never smoker;  Marital Status - Married; Alcohol Use: Never; Drug Use: No History; Caffeine Use: Daily - tea, soda; Financial Concerns: No; Food, Clothing or Shelter Needs: No; Support System Lacking: No; Transportation Concerns: No Electronic Signature(s) Signed: 02/10/2023 11:26:06 AM By: [name] MD FACS Entered By: [name] on 02/10/2023 11:06:23 -------------------------------------------------------------------------------- SuperBill Details Patient Name: Date of Service: [name], [name] [place] E. 02/10/2023 Medical Record Number: [id number] Patient Account Number: [id number] Date of Birth/Sex: Treating RN: 1976-05-23 (47 y.o. [name] Primary Care Provider: Dorinda Hill Other Clinician: Referring Provider: Treating Provider/Extender: Damon, Hargrove ([id number]) [id number] Page 17 of 17 Weeks in Treatment: 134 Diagnosis Coding ICD-10 Codes Code Description [phone] Non-pressure chronic ulcer of other part of left foot with fat layer exposed Facility Procedures : CPT4 Code: [id number] Description: 97597 - DEBRIDE WOUND 1ST 20 SQ CM OR < ICD-10 Diagnosis Description L97.522 Non-pressure chronic ulcer of other part of left foot with fat layer exposed Modifier: Quantity: 1 Physician Procedures : CPT4 Code Description Modifier [id number] - WC PHYS LEVEL 4 - EST PT 25 ICD-10 Diagnosis Description L97.522 Non-pressure chronic ulcer of other part of left foot with fat layer exposed Quantity: 1 : [id number] - WC PHYS DEBR WO ANESTH 20 SQ CM ICD-10 Diagnosis Description L97.522 Non-pressure chronic ulcer of other part of left foot with fat layer exposed Quantity: 1 Electronic Signature(s) Signed: 02/10/2023 11:08:48 AM By: [name] MD FACS Entered By: [name] on 02/10/2023 11:08:48

## 2023-02-15 NOTE — Progress Notes (Signed)
DRAYDON, CLAIRMONT ([id number]) [id number] Page 1 of 8 Visit Report for 02/10/2023 Arrival Information Details Patient Name: Date of Service: [name], TO [name] 02/10/2023 10:15 A M Medical Record Number: [id number] Patient Account Number: [id number] Date of Birth/Sex: Treating RN: [birth year] (47 y.o. M) Primary Care Conita Amenta: Dorinda Hill Other Clinician: Referring Revecca Nachtigal: Treating Deserae Jennings/Extender: Jana Hakim in Treatment: 134 Visit Information History Since Last Visit All ordered tests and consults were completed: No Patient Arrived: Wheel Chair Added or deleted any medications: No Arrival Time: 10:18 Any new allergies or adverse reactions: No Accompanied By: spouse Had a fall or experienced change in No Transfer Assistance: None activities of daily living that may affect Patient Identification Verified: Yes risk of falls: Secondary Verification Process Completed: Yes Signs or symptoms of abuse/neglect since last visito No Patient Requires Transmission-Based Precautions: No Hospitalized since last visit: No Patient Has Alerts: No Implantable device outside of the clinic excluding No cellular tissue based products placed in the center since last visit: Pain Present Now: No Electronic Signature(s) Signed: 02/15/2023 4:28:57 PM By: Brenton Grills Entered By: Brenton Grills on 02/10/2023 10:20:25 -------------------------------------------------------------------------------- Encounter Discharge Information Details Patient Name: Date of Service: [name], TO [name] 02/10/2023 10:15 A M Medical Record Number: [id number] Patient Account Number: [id number] Date of Birth/Sex: Treating RN: 02-06-1976 (47 y.o. [name] Primary Care Jenaveve Fenstermaker: Dorinda Hill Other Clinician: Referring Ulice Follett: Treating Alante Weimann/Extender: Jana Hakim in Treatment: 134 Encounter Discharge Information Items Post Procedure  Vitals Discharge Condition: Stable Temperature (F): 97.8 Ambulatory Status: Wheelchair Pulse (bpm): 80 Discharge Destination: Home Respiratory Rate (breaths/min): 18 Transportation: Private Auto Blood Pressure (mmHg): 128/84 Accompanied By: spouse Schedule Follow-up Appointment: Yes Clinical Summary of Care: Patient Declined Electronic Signature(s) Signed: 02/15/2023 4:28:57 PM By: Brenton Grills Entered By: Brenton Grills on 02/10/2023 10:46:20 [name] ([id number]) [id number] Page 2 of 8 -------------------------------------------------------------------------------- Lower Extremity Assessment Details Patient Name: Date of Service: [name] 02/10/2023 10:15 A M Medical Record Number: [id number] Patient Account Number: [id number] Date of Birth/Sex: Treating RN: [birth year] (47 y.o. [name] Primary Care Belal Scallon: Dorinda Hill Other Clinician: Referring Shirl Weir: Treating Jhoanna Heyde/Extender: Jana Hakim in Treatment: 134 Edema Assessment Assessed: [name]: No] Franne Forts: No] Edema: [Left: N] [Right: o] Calf Left: Right: Point of Measurement: 29 cm From Medial Instep 42.8 cm Ankle Left: Right: Point of Measurement: 9 cm From Medial Instep 24.7 cm Vascular Assessment Pulses: Dorsalis Pedis Palpable: [Left:Yes] Electronic Signature(s) Signed: 02/15/2023 4:28:57 PM By: Brenton Grills Entered By: Brenton Grills on 02/10/2023 10:23:05 -------------------------------------------------------------------------------- Multi Wound Chart Details Patient Name: Date of Service: [name], [name] 02/10/2023 10:15 A M Medical Record Number: [id number] Patient Account Number: [id number] Date of Birth/Sex: Treating RN: 1976-06-16 (47 y.o. M) Primary Care Deysi Soldo: Dorinda Hill Other Clinician: Referring Allizon Woznick: Treating Audri Kozub/Extender: Jana Hakim in Treatment: 134 Vital Signs Height(in):  69 Pulse(bpm): 99 Weight(lbs): 280 Blood Pressure(mmHg): 131/76 Body Mass Index(BMI): 41.3 Temperature(F): 98.8 Respiratory Rate(breaths/min): 20 [2:Photos: No Photos Left Calcaneus Wound Location: Pressure Injury Wounding Event: Pressure Ulcer Primary Etiology: Asthma, Angina, Hypertension Comorbid History: 10/07/2019 Date Acquired: 134 Weeks of Treatment: Open Wound Status: No Wound Recurrence:] [N/A:N/A N/A  N/A N/A N/A N/A N/A N/A N/A] JESIAH, GRISMER ([id number]) [2:3.2x0.8x0.2 Measurements L x W x D (cm) 2.011 A (cm) : rea 0.402 Volume (cm) : 92.60% % Reduction in A rea: 98.50% % Reduction in Volume: Category/Stage III Classification: Medium Exudate A mount:  Serosanguineous Exudate Type: red, brown Exudate  Color: Distinct, outline attached Wound Margin: Medium (34-66%) Granulation A mount: Red, Pink Granulation Quality: Medium (34-66%) Necrotic A mount: Fat Layer (Subcutaneous Tissue): Yes N/A Exposed Structures: Fascia: No Tendon: No Muscle: No Joint: No  Bone: No Debridement - Selective/Open Wound N/A Debridement: Pre-procedure Verification/Time Out 10:40 Taken: Lidocaine 4% T opical Solution Pain Control: Callus, Slough Tissue Debrided: Non-Viable Tissue Level: 2.01 Debridement A (sq cm): rea Curette  Instrument: Minimum Bleeding: Pressure Hemostasis A chieved: 0 Procedural Pain: 0 Post Procedural Pain: Procedure was tolerated well Debridement Treatment Response: 3.2x0.8x0.2 Post Debridement Measurements L x W x D (cm) 0.402 Post Debridement Volume:  (cm) Category/Stage III Post Debridement Stage: Excoriation: No Periwound Skin Texture: Induration: No Callus: No Crepitus: No Rash: No Scarring: No Dry/Scaly: Yes Periwound Skin Moisture: Maceration: No Hemosiderin Staining: Yes Periwound Skin Color:  Atrophie Blanche: No Cyanosis: No Ecchymosis: No Erythema: No Mottled: No Pallor: No Rubor: No No Abnormality Temperature: Debridement Procedures Performed:] [N/A:N/A N/A N/A N/A N/A N/A N/A N/A N/A  N/A N/A N/A N/A N/A N/A N/A N/A N/A N/A N/A N/A N/A N/A  N/A N/A N/A N/A N/A N/A N/A N/A N/A] Treatment Notes Wound #2 (Calcaneus) Wound Laterality: Left Cleanser Normal Saline Discharge Instruction: Cleanse the wound with Normal Saline prior to applying a clean dressing using gauze sponges, not tissue or cotton balls. Wound Cleanser Discharge Instruction: Cleanse the wound with wound cleanser prior to applying a clean dressing using gauze sponges, not tissue or cotton balls. Peri-Wound Care Ketoconazole Cream 2% Discharge Instruction: Apply Ketoconazole as directed Topical Gentamicin compounding topical antibiotics from [facility] pharmacy Discharge Instruction: HOLD Primary Dressing Maxorb Extra Ag+ Alginate Dressing, 4x4.75 (in/in) Discharge Instruction: Apply to wound bed as instructed Secondary Dressing Drawtex 4x4 in Discharge Instruction: Apply over primary dressing as directed. NICKI, GRACY ([id number]) [id number] Page 4 of 8 Optifoam Non-Adhesive Dressing, 4x4 in Discharge Instruction: Apply over primary dressing as directed. Woven Gauze Sponge, Non-Sterile 4x4 in Discharge Instruction: Apply over primary dressing as directed. Secured With 75M Medipore H Soft Cloth Surgical T ape, 4 x 10 (in/yd) Discharge Instruction: Secure with tape as directed. Compression Wrap Kerlix Roll 4.5x3.1 (in/yd) Discharge Instruction: Apply Kerlix and Coban compression as directed. Compression Stockings Add-Ons [place] V[facility] Offloading Boot, Size Regular Electronic Signature(s) Signed: 02/10/2023 11:01:46 AM By: [name] MD FACS Entered By: [name] on 02/10/2023 11:01:46 -------------------------------------------------------------------------------- Multi-Disciplinary Care Plan Details Patient Name: Date of Service: [name], TO [name] 02/10/2023 10:15 A M Medical Record Number: [id number] Patient Account Number: [id number] Date of Birth/Sex:  Treating RN: 1976-07-16 (47 y.o. [name] Primary Care Mandeep Kiser: Dorinda Hill Other Clinician: Referring Kemara Quigley: Treating Briselda Naval/Extender: Jana Hakim in Treatment: 134 Multidisciplinary Care Plan reviewed with physician Active Inactive Wound/Skin Impairment Nursing Diagnoses: Impaired tissue integrity Knowledge deficit related to ulceration/compromised skin integrity Goals: Patient/caregiver will verbalize understanding of skin care regimen Date Initiated: 07/15/2020 Target Resolution Date: 05/08/2023 Goal Status: Active Ulcer/skin breakdown will have a volume reduction of 30% by week 4 Date Initiated: 07/15/2020 Date Inactivated: 08/20/2020 Target Resolution Date: 09/03/2020 Goal Status: Unmet Unmet Reason: no major changes. Ulcer/skin breakdown will heal within 14 weeks Date Initiated: 12/04/2020 Date Inactivated: 12/10/2020 Target Resolution Date: 12/10/2020 Unmet Reason: wounds still open at 14 Goal Status: Unmet weeks and today 21 weeks. Interventions: Assess patient/caregiver ability to obtain necessary supplies Assess patient/caregiver ability to perform ulcer/skin care regimen upon admission and as needed Assess ulceration(s) every visit Provide  education on ulcer and skin care Treatment Activities: Skin care regimen initiated : 07/15/2020 Topical wound management initiated : 07/15/2020 Notes: BLADE, SCHEFF ([id number]) [id number] Page 5 of 8 Electronic Signature(s) Signed: 02/15/2023 4:28:57 PM By: Brenton Grills Entered By: Brenton Grills on 02/10/2023 10:32:39 -------------------------------------------------------------------------------- Pain Assessment Details Patient Name: Date of Service: [name], TO [name] 02/10/2023 10:15 A M Medical Record Number: [id number] Patient Account Number: [id number] Date of Birth/Sex: Treating RN: Jan 11, 1976 (47 y.o. M) Primary Care Pj Zehner: Dorinda Hill Other  Clinician: Referring Carrissa Taitano: Treating Jacorian Golaszewski/Extender: Jana Hakim in Treatment: 134 Active Problems Location of Pain Severity and Description of Pain Patient Has Paino No Site Locations Pain Management and Medication Current Pain Management: Electronic Signature(s) Signed: 02/10/2023 11:23:56 AM By: Dayton Scrape Entered By: Dayton Scrape on 02/10/2023 10:19:33 -------------------------------------------------------------------------------- Patient/Caregiver Education Details Patient Name: Date of Service: Mart Piggs 7/5/2024andnbsp10:15 A M Medical Record Number: [id number] Patient Account Number: [id number] Date of Birth/Gender: Treating RN: Dec 09, 1975 (47 y.o. [name] Primary Care Physician: Dorinda Hill Other Clinician: Referring Physician: Treating Physician/Extender: Jana Hakim in Treatment: 134 Education Assessment Education Provided To: Patient and Caregiver YOSTIN, MALACARA ([id number]) [id number] Page 6 of 8 Education Topics Provided Wound/Skin Impairment: Methods: Explain/Verbal Responses: State content correctly Electronic Signature(s) Signed: 02/15/2023 4:28:57 PM By: Brenton Grills Entered By: Brenton Grills on 02/10/2023 10:33:00 -------------------------------------------------------------------------------- Wound Assessment Details Patient Name: Date of Service: [name], TO [name] 02/10/2023 10:15 A M Medical Record Number: [id number] Patient Account Number: [id number] Date of Birth/Sex: Treating RN: 1976-03-16 (47 y.o. [name] Primary Care Cristabel Bicknell: Dorinda Hill Other Clinician: Referring Calea Hribar: Treating Cory Kitt/Extender: Jana Hakim in Treatment: 134 Wound Status Wound Number: 2 Primary Etiology: Pressure Ulcer Wound Location: Left Calcaneus Wound Status: Open Wounding Event: Pressure Injury Comorbid History: Asthma, Angina,  Hypertension Date Acquired: 10/07/2019 Weeks Of Treatment: 134 Clustered Wound: No Wound Measurements Length: (cm) 3.2 Width: (cm) 0.8 Depth: (cm) 0.2 Area: (cm) 2.011 Volume: (cm) 0.402 % Reduction in Area: 92.6% % Reduction in Volume: 98.5% Tunneling: No Undermining: No Wound Description Classification: Category/Stage III Wound Margin: Distinct, outline attached Exudate Amount: Medium Exudate Type: Serosanguineous Exudate Color: red, brown Foul Odor After Cleansing: No Slough/Fibrino No Wound Bed Granulation Amount: Medium (34-66%) Exposed Structure Granulation Quality: Red, Pink Fascia Exposed: No Necrotic Amount: Medium (34-66%) Fat Layer (Subcutaneous Tissue) Exposed: Yes Necrotic Quality: Adherent Slough Tendon Exposed: No Muscle Exposed: No Joint Exposed: No Bone Exposed: No Periwound Skin Texture Texture Color No Abnormalities Noted: No No Abnormalities Noted: No Callus: No Atrophie Blanche: No Crepitus: No Cyanosis: No Excoriation: No Ecchymosis: No Induration: No Erythema: No Rash: No Hemosiderin Staining: Yes Scarring: No Mottled: No Pallor: No Moisture Rubor: No No Abnormalities Noted: No Dry / Scaly: Yes Temperature / Pain Maceration: No Temperature: No Abnormality JAKALEB, PAYER ([id number]) [id number] Page 7 of 8 Treatment Notes Wound #2 (Calcaneus) Wound Laterality: Left Cleanser Normal Saline Discharge Instruction: Cleanse the wound with Normal Saline prior to applying a clean dressing using gauze sponges, not tissue or cotton balls. Wound Cleanser Discharge Instruction: Cleanse the wound with wound cleanser prior to applying a clean dressing using gauze sponges, not tissue or cotton balls. Peri-Wound Care Ketoconazole Cream 2% Discharge Instruction: Apply Ketoconazole as directed Topical Gentamicin compounding topical antibiotics from [facility] pharmacy Discharge Instruction: HOLD Primary Dressing Maxorb  Extra Ag+ Alginate Dressing, 4x4.75 (in/in) Discharge Instruction: Apply to wound bed as instructed Secondary Dressing Drawtex 4x4  in Discharge Instruction: Apply over primary dressing as directed. Optifoam Non-Adhesive Dressing, 4x4 in Discharge Instruction: Apply over primary dressing as directed. Woven Gauze Sponge, Non-Sterile 4x4 in Discharge Instruction: Apply over primary dressing as directed. Secured With 49M Medipore H Soft Cloth Surgical T ape, 4 x 10 (in/yd) Discharge Instruction: Secure with tape as directed. Compression Wrap Kerlix Roll 4.5x3.1 (in/yd) Discharge Instruction: Apply Kerlix and Coban compression as directed. Compression Stockings Add-Ons [place] Vascular Offloading Boot, Size Regular Electronic Signature(s) Signed: 02/15/2023 4:28:57 PM By: Brenton Grills Entered By: Brenton Grills on 02/10/2023 10:28:58 -------------------------------------------------------------------------------- Vitals Details Patient Name: Date of Service: [name], TO [name] 02/10/2023 10:15 A M Medical Record Number: [id number] Patient Account Number: [id number] Date of Birth/Sex: Treating RN: [birth year] (47 y.o. M) Primary Care Morena Mckissack: Dorinda Hill Other Clinician: Referring Giovanie Lefebre: Treating Blandina Renaldo/Extender: Jana Hakim in Treatment: 134 Vital Signs Time Taken: 10:19 Temperature (F): 98.8 Height (in): 69 Pulse (bpm): 99 Weight (lbs): 280 Respiratory Rate (breaths/min): 20 Body Mass Index (BMI): 41.3 Blood Pressure (mmHg): 131/76 Reference Range: 80 - 120 mg / dl DARA, CAMARGO ([id number]) [id number] Page 8 of 8 Electronic Signature(s) Signed: 02/10/2023 11:23:56 AM By: Dayton Scrape Entered By: Dayton Scrape on 02/10/2023 10:19:25

## 2023-02-16 ENCOUNTER — Encounter (HOSPITAL_BASED_OUTPATIENT_CLINIC_OR_DEPARTMENT_OTHER): Payer: PPO | Admitting: General Surgery

## 2023-02-16 DIAGNOSIS — L89623 Pressure ulcer of left heel, stage 3: Secondary | ICD-10-CM | POA: Diagnosis not present

## 2023-02-16 DIAGNOSIS — L97522 Non-pressure chronic ulcer of other part of left foot with fat layer exposed: Secondary | ICD-10-CM | POA: Diagnosis not present

## 2023-02-16 NOTE — Progress Notes (Addendum)
CHAOS, POKE ([id number]) [id number] Page 1 of 17 Visit Report for 02/16/2023 Chief Complaint Document Details Patient Name: Date of Service: [name], [name] [name] 02/16/2023 9:30 A M Medical Record Number: [id number] Patient Account Number: [id number] Date of Birth/Sex: Treating RN: 05-12-1976 (47 y.o. M) Primary Care Provider: Dorinda Hill Other Clinician: Referring Provider: Treating Provider/Extender: Jana Hakim in Treatment: 135 Information Obtained from: Patient Chief Complaint Bilateral Plantar Foot Ulcers Electronic Signature(s) Signed: 02/16/2023 10:06:44 AM By: [name] MD FACS Entered By: [name] on 02/16/2023 10:06:44 -------------------------------------------------------------------------------- Debridement Details Patient Name: Date of Service: [name], [name] [place] E. 02/16/2023 9:30 A M Medical Record Number: [id number] Patient Account Number: [id number] Date of Birth/Sex: Treating RN: [birth year] (47 y.o. M) Primary Care Provider: Dorinda Hill Other Clinician: Referring Provider: Treating Provider/Extender: Jana Hakim in Treatment: 135 Debridement Performed for Assessment: Wound #2 Left Calcaneus Performed By: Physician [name], MD Debridement Type: Debridement Level of Consciousness (Pre-procedure): Awake and Alert Pre-procedure Verification/Time Out Yes - 10:00 Taken: Start Time: 10:01 Pain Control: Lidocaine 4% Topical Solution Percent of Wound Bed Debrided: 75% T Area Debrided (cm): otal 4.03 Tissue and other material debrided: Non-Viable, Callus, Slough, Slough Level: Non-Viable Tissue Debridement Description: Selective/Open Wound Instrument: Curette Bleeding: Minimum Hemostasis Achieved: Pressure End Time: 10:03 Procedural Pain: 0 Post Procedural Pain: 0 Response [name] Treatment: Procedure was tolerated well Level of Consciousness (Post- Awake and  Alert procedure): Post Debridement Measurements of Total Wound Length: (cm) 3.8 Stage: Category/Stage III Width: (cm) 1.8 Depth: (cm) 0.1 Volume: (cm) 0.537 Character of Wound/Ulcer Post Debridement: Improved [name], [name] ([id number]) [id number] Page 2 of 17 Post Procedure Diagnosis Same as Pre-procedure Notes scribed for Dr. Lady Gary by Brenton Grills, RN Electronic Signature(s) Signed: 02/16/2023 10:06:35 AM By: [name] MD FACS Entered By: [name] on 02/16/2023 10:06:35 -------------------------------------------------------------------------------- HPI Details Patient Name: Date of Service: [name], [name] [place] E. 02/16/2023 9:30 A M Medical Record Number: [id number] Patient Account Number: [id number] Date of Birth/Sex: Treating RN: [birth year] (47 y.o. M) Primary Care Provider: Dorinda Hill Other Clinician: Referring Provider: Treating Provider/Extender: Jana Hakim in Treatment: 135 History of Present Illness HPI Description: Wounds are12/03/2020 upon evaluation today patient presents for initial inspection here in our clinic concerning issues he has been having with the bottoms of his feet bilaterally. He states these actually occurred as wounds when he was hospitalized for 5 months secondary [name] Covid. He was apparently with tilting bed where he was in an upright position quite frequently and apparently this occurred in some way shape or form during that time. Fortunately there is no sign of active infection at this time. No fevers, chills, nausea, vomiting, or diarrhea. With that being said he still has substantial wounds on the plantar aspects of his feet Theragen require quite a bit of work [name] get these [name] heal. He has been using Santyl currently though that is been problematic both in receiving the medication as well as actually paid for it as it is become quite expensive. Prior [name] the experience with Covid the  patient really did not have any major medical problems other than hypertension he does have some mild generalized weakness following the Covid experience. 07/22/2020 on evaluation today patient appears [name] be doing okay in regard [name] his foot ulcers I feel like the wound beds are showing signs of better improvement that I do believe the Iodoflex is helping in this regard. With that being said he does  have a lot of drainage currently and this is somewhat blue/green in nature which is consistent with Pseudomonas. I do think a culture today would be appropriate for [place] [name] evaluate and see if that is indeed the case I would likely start him on antibiotic orally as well he is not allergic [name] Cipro knows of no issues he has had in the past 12/21; patient was admitted [name] the clinic earlier this month with bilateral presumed pressure ulcers on the bottom of his feet apparently related [name] excessive pressure from a tilt table arrangement in the intensive care unit. Patient relates this [name] being on ECMO but I am not really sure that is exactly related [name] that. I must say I have never seen anything like this. He has fairly extensive full-thickness wounds extending from his heel towards his midfoot mostly centered laterally. There is already been some healing distally. He does not appear [name] have an arterial issue. He has been using gentamicin [name] the wound surfaces with Iodoflex [name] help with ongoing debridement 1/6; this is a patient with pressure ulcers on the bottom of his feet related [name] excessive pressure from a standing position in the intensive care unit. He is complaining of a lot of pain in the right heel. He is not a diabetic. 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 We have been using Iodoflex [name] help prepare the surfaces of both wounds for an advanced treatment product. He is nonambulatory spending most of his time in a wheelchair I have asked him not [name] propel the wheelchair with  his heels 1/13; in general his wounds look better not much surface area change we have been using Iodoflex as of last week. I did an x-ray of the right heel as the patient was complaining of pain. I had some thoughts about a stress fracture perhaps Achilles tendon problems however what it showed was erosive changes along the inferior aspect of the calcaneus he now has a MRI booked for 1/20. 1/20; in general his wounds continue [name] be better. Some improvement in the large narrow areas proximally in his foot. He is still complaining of pain in the right heel and tenderness in certain areas of this wound. His MRI is tonight. I am not just looking for osteomyelitis that was brought up on the x-ray I am wondering about stress fractures, tendon ruptures etc. He has no such findings on the left. Also noteworthy is that the patient had critical illness neuropathy and some of the discomfort may be actual improvement in nerve function I am just not sure. These wounds were initially in the setting of severe critical illness related [name] COVID-19. He was put in a standing position. He may have also been on pressors at the point contributing [name] tissue ischemia. By his description at some point these wounds were grossly necrotic extending proximally up into the Achilles part of his heel. I do not know that I have ever really seen pictures of them like this although they may exist in epic We have ordered Tri layer Oasis. I am trying [name] stimulate some granulation in these areas. This is of course assuming the MRI is negative for infection 1/27; since the patient was last here he saw Dr. Earlene Plater of infectious disease. He is planned for vancomycin and ceftriaxone. Prior operative culture grew MSSA. Also ordered baseline lab work. He also ordered arterial studies although the ABIs in our clinic were normal as well as his clinical exam these were  normal I do not think he needs [name] see vascular surgery. His ABIs at the [facility]  were 1.22 in the right triphasic waveforms with a normal TBI of 1.15 on the left ABI of 1.22 with triphasic waveforms and a normal TBI of 1.08. Finally he saw Dr. Logan Bores who will follow him in for 2 months. At this point I do not think he felt that he needed a procedure on the right calcaneal bone. Dr. Earlene Plater is elected for broad-spectrum antibiotic The patient is still having pain in the right heel. He walks with a walker 2/3; wounds are generally smaller. He is tolerating his IV antibiotics. I believe this is vancomycin and ceftriaxone. We are still waiting for Oasis burn in terms of his out-of-pocket max which he should be meeting soon given the IV antibiotics, MRIs etc. I have asked him [name] check in on this. We are using silver collagen in the meantime the wounds look better 2/10; tolerating IV vancomycin and Rocephin. We are waiting [name] apply for Oasis. Although I am not really sure where he is in his out-of-pocket max. [name], [name] ([id number]) [id number] Page 3 of 17 2/17 started the first application of Oasis trilayer. Still on antibiotics. The wounds have generally look better. The area on the left has a little more surface slough requiring debridement 2/24; second application of Oasis trilayer. The wound surface granulation is generally look better. The area on the left with undermining laterally I think is come in a bit. 10/08/2020 upon evaluation today patient is here today for [employer] application #3. Fortunately he seems [name] be doing extremely well with regard [name] this and we are seeing a lot of new epithelial growth which is great news. Fortunately there is no signs of active infection at this time. 10/16/2020 upon evaluation today patient appears [name] be doing well with regard [name] his foot ulcers. Do believe the Oasis has been of benefit for him. I do not see any signs of infection right now which is great news and I think that he has a lot of new  epithelial growth which is great [name] see as well. The patient is very pleased [name] hear all of this. I do think we can proceed with the Oasis trilayer #4 today. 3/18; not as much improvement in these areas on his heels that I was hoping. I did reapply trilateral Oasis today the tissue looks healthier but not as much fill in as I was hoping. 3/25; better looking today I think this is come in a bit the tissue looks healthier. Triple layer Oasis reapplied #6 4/1; somewhat better looking definitely better looking surface not as much change in surface area as I was hoping. He may be spending more time [place] on days then he needs [name] although he does have heel offloading boots. Triple layer Oasis reapplied #7 4/7; unfortunately apparently [facility] will not approve any further Oasis which is unfortunate since the patient did respond nicely both in terms of the condition of the wound bed as well as surface area. There is still some drainage coming from the wound but not a lot there does not appear [name] be any infection 4/15; we have been using Hydrofera Blue. He continues [name] have nice rims of epithelialization on the right greater than the left. The left the epithelialization is coming from the tip of his heel. There is moderate drainage. In this that concerns me about a total contact cast. There is no evidence of  infection 4/29; patient has been using Hydrofera Blue with dressing changes. He has no complaints or issues today. 5/5; using Hydrofera Blue. I actually think that he looks marginally better than the last time I saw this 3 weeks ago. There are rims of epithelialization on the left thumb coming from the medial side on the right. Using Hydrofera Blue 5/12; using Hydrofera Blue. These continue [name] make improvements in surface area. His drainage was not listed as severe I therefore went ahead and put a cast on the left foot. Right foot we will continue [name] dress his previous 5/16; back for first  total contact cast change. He did not tolerate this particularly well cast injury on the anterior tibia among other issues. Difficulty sleeping. I talked him about this in some detail and afterwards is elected [name] continue. I told him I would like [name] have a cast on for 3 weeks [name] see if this is going [name] help at all. I think he agreed 5/19; I think the wound is better. There is no tunneling towards his midfoot. The undermining medially also looks better. He has a rim of new skin distally. I think we are making progress here. The area on the left also continues [name] look somewhat better [name] me using Hydrofera Blue. He has a list of complaints about the cast but none of them seem serious 5/26; patient presents for 1 week follow-up. He has been using a total contact cast and tolerating this well. Hydrofera Blue is the main dressing used. He denies signs of infection. 6/2 Hydrofera Blue total contact cast on the left. These were large ulcers that formed in intensive care unit where the patient was recovering from COVID. May have had something to do with being ventilated in an upright positiono Pressors etc. We have been able [name] get the areas down considerably and a viable surface. There is some epithelialization in both sides. Note made of drainage 6/9; [name] because of drainage. He arrives with better looking surfaces and dimensions on the left than the right. Paradoxically the right actually probes more towards his midfoot the left is largely close down but both of these look improved. Using a total contact cast on the left 6/16; complex wounds on his bilateral plantar heels which were initially pressure injury from a stay in the ICU with COVID. We have been using silver alginate most recently. His dimensions of come in quite dramatically however not recently. We have been putting the left foot in a total contact cast 6/23; complex wounds on the bilateral plantar heels. I been  putting the left in the cast paradoxically the area on the right is the one that is going towards closure at a faster rate. Quite a bit of drainage on the left. The patient went [name] see Dr. Logan Bores who said he was going [name] standby for skin grafts. I had actually considered sending him for skin grafts however he would be mandatorily off his feet for a period of weeks [name] months. I am thinking that the area on the right is going [name] close on its own the area on the left has been more stubborn even though we have him in a total contact cast 6/30; took him out of a total contact cast last week is the right heel seem [name] be making better progress than the left where I was placing the cast. We are using silver alginate. Both wounds are smaller right greater than left 7/12; both  wounds look as though they are making some progress. We are using silver alginate. Heel offloading boots 7/26; very gradual progress especially on the right. Using silver alginate. He is wearing heel offloading boots 8/18; he continues [name] close these wounds down very gradually. Using silver alginate. The problem polymen being definitive about this is areas of what appears [name] be callus around the margins. This is not a 100% of the area but certainly sizable especially on the right 9/1; bilateral plantar feet wounds secondary [name] prolonged pressure while being ventilated for COVID-19 in an upright position. Essentially pressure ulcers on the bottom of his feet. He is made substantial progress using silver alginate. 9/14; bilateral plantar feet wounds secondary [name] prolonged pressure. Making progress using silver alginate. 9/29 bilateral plantar feet wounds secondary [name] prolonged pressure. I changed him [name] Iodoflex last week. MolecuLight showing reddened blush fluorescence 10/11; patient presents for follow-up. He has no issues or complaints today. He denies signs of infection. He continues [name] use Iodoflex and antibiotic ointment [name] the wound  beds. 10/27; 2-week follow-up. No evidence of infection. He has callus and thick dry skin around the wound margins we have been using Iodoflex and Bactroban which was in response [name] a moderate left MolecuLight reddish blush fluorescence. 11/10; 2-week follow-up. Wound margins again have thick callus however the measurements of the actual wound sites are a lot smaller. Everything looks reasonably healthy here. We have been using Iodoflex He was approved for prime matrix but I have elected [name] delay this given the improvement in the surface area. Hopefully I will not regret that decision as were getting close [name] the end of the year in terms of insurance payment 12/8; 2-week follow-up. Wounds are generally smaller in size. These were initially substantial wounds extending into the forefoot all the way into the heel on the bilateral plantar feet. They are now both located on the plantar heel distal aspect both of these have a lot of callus around the wounds I used a #5 curette [name] remove this on the right and the left also some subcutaneous debris [name] try and get the wound edges were using Iodoflex. He has heel offloading shoe 12/22; 2-week follow-up. Not really much improvement. He has thick callus around the outer edges of both wounds. I remove this there is some nonviable subcutaneous tissue as well. We have been using Iodoflex. Her intake nurse and myself spontaneously thought of a total contact cast I went back in May. At that time we really were not seeing much of an improvement with a cast although the wound was in a much different situation I would like [name] retry this in 2 weeks and I discussed this with the patient 08/12/2021; the patient has had some improvement with the Iodoflex. The the area on the left heel plantar more improved than the right. I had [name] put him in a total contact cast on the left although I decided [name] put that off for 2 weeks. I am going [name] change his primary dressing [name] silver  collagen. I think in both areas he has had some improvement most of the healing seems [name] be more proximal in the heel. The wounds are in the mid aspect. A lot of thick callus on the right heel however. 1/19; we are using silver collagen on both plantar heel areas. He has had some improvement today. The left did not require any debridement. He still had some eschar on the right that was debrided but both seem  [name] have contracted. I did not put it total contact cast on him today 2/2 we have been using silver collagen. The area on the right plantar heel has areas that appear [name] be epithelialized interspersed with dry flaking callus and dry skin. I removed this. This really looks better than on the other side. On the left still a large area with raised edges and debris on the surface. 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 He has no issues or complaints today. 2/9; not much change in the left foot wound with 1 week of a cast we are using silver collagen. Silver collagen on the right side. The right side has been the better wound surface. We will reapply the total contact cast on the left 2/16; not much improvement on either side I been using silver collagen with a total contact cast on the left. I'm changing the [employer] still with a total CRASH, LONTZ ([id number]) [id number] Page 4 of 17 contact cast on the left 2/23; some improvement on both sides. Disappointing that he has thick callus around the area that we are putting in a total contact cast on the left. We've been using Hydrofera Blue on both wound areas. This is a man who at essentially pressure ulcers in addition [name] ischemia caused by medications [name] support his blood pressure (pressors) in the ICU. He was being ventilated in the standing position for severe Covid. A Shiley  the wounds extended across his entire foot but are now localized [name] his plantar heels bilaterally. We have made progress however neither areas healed. I continue [name] think the total contact cast is helped albeit painstakingly slowly. He has never wanted a plastic surgery consult although I don't know that they would be interested in grafting in area in this location. 10/07/2021: Continued improvement bilaterally. There is still some callus around the left wound, despite the total contact cast. He has some increased pain in his right midfoot around 1 particular area. This has been painful in the past but seems [name] be a little bit worse. When his cast was removed today, there was an area on the heel of the left foot that looks a bit macerated. He is also complaining of pain in his left thigh and hip which he thinks is secondary [name] the limb length discrepancy caused by the cast. 10/14/2021: He continues [name] improve. A little bit less callus around the left wound. He continues [name] endorse pain in his right midfoot, but this is not as significant as it was last week. The maceration on his left heel is improved. 10/21/2021: Continued improvement [name] both wounds. The maceration on his left heel is no longer evident. Less callus bilaterally. Epithelialization progressing. 10/28/2021: Significant improvement this week. The right sided wound is nearly closed with just a small open area at the middle. No maceration seen on the left heel. Continued epithelialization on both sides. No concern for infection. 11/04/2021: T oday, the wounds were measured a little bit differently and come out as larger, but I actually think they are about the same [name] potentially even smaller, particularly on the left. He continues [name] accumulate some callus on the right. 11/11/2021: T oday, the patient is expressing some concern that the left wound, despite being in the total contact cast, is not progressing at the same rate as the 1 on the  right.  He is interested in trying a week without the cast [name] see how the wound does. The wounds are roughly the same size as last week, with the right perhaps being a little bit smaller. He continues [name] build up callus on both sites. 11/18/2021: Last week, I permitted the patient [name] go without his total contact cast, just [name] see if the cast was really making any difference. Today, both wounds have deteriorated [name] some extent, suggesting that the cast is providing benefit, at least on the left. Both are larger and have accumulated callus, slough, and other debris. 11/26/2021: I debrided both wounds quite aggressively last week in an effort [name] stimulate the healing cascade. This appears [name] have been effective as the left sided wound is a full centimeter shorter in length. Although the right was measured slightly larger, on inspection, it looks as though an area of epithelialized tissue was included in the measurements. We have been using PolyMem Ag on the wound surfaces with a total contact cast [name] the left. 12/02/2021: It appears that the intake personnel are including epithelialized tissue in his wound measurements; the right wound is almost completely epithelialized; there is just a crater at the proximal midfoot with some open areas. On the left, he has built up some callus, but the overall wound surface looks good. There is some senescent skin around the wound margin. He has been in PolyMem Ag bilaterally with a total contact cast on the left. 12/09/2021: The right wound is nearly closed; there is just a small open area at the mid calcaneus. On the left, the wound is smaller with minimal callus buildup. No significant drainage. 12/16/2021: The right calcaneal wound remains minimally open at the mid calcaneus; the rest has epithelialized. On the left, the wound is also a little bit smaller. There is some senescent tissue on the periphery. He is getting his first application of a trial skin substitute called  Vendaje today. 12/23/2021: The wound on his right calcaneus is nearly closed; there is just a small area at the most distal aspect of the calcaneus that is open. On the left, the area where we applied [name] the skin substitute has a healthier-looking bed of granulation tissue. The wound dimensions are not significantly different on this side but the wound surface is improved. 12/30/2021: The wound on the right calcaneus has not changed significantly aside from some accumulation of callus. On the left, the open area is smaller and continues [name] have an improved surface. He continues [name] accumulate callus around the wound. He is here for his third application of Vendaje. 01/06/2022: The right calcaneal wound is down [name] just a couple of millimeters. He continues [name] accumulate periwound callus. He unfortunately got his cast wet earlier in the week and his left foot is macerated, resulting in some superficial skin loss just distal [name] the open wound. The open wound itself, however, is much smaller and has a healthier appearing surface. He is here for his fourth application of Vendaje. 01/13/2022: The right calcaneal wound is about the same. Unfortunately, once again, his cast got wet and his foot is again macerated. This is caused the left calcaneal wound [name] enlarge. He is here for his fifth application of Vendaje. 01/20/2022: The right calcaneal wound is very small. There is some periwound callus accumulation. He purchased a new cast protector last week and this has been effective in avoiding the maceration that has been occurring on the left. The left calcaneal wound is narrower  and has a healthy and viable-appearing surface. He is here for his 6 application of Vendaje. 01/27/2022: The right calcaneal wound is down [name] just a pinhole. There is some periwound slough and callus. On the left, the wound is narrower and shorter by about a centimeter. The surface is robust and viable-appearing. Unfortunately, the rep for the  trial skin substitute product did not provide any for [place] [name] use today. 02/04/2022: The right calcaneal wound remains unchanged. There is more accumulated callus. On the left, although the intake nurse measured it a little bit longer, it looks about the same [name] me. The surface has a layer of slough, but underneath this, there is good granulation tissue. 02/10/2022: The right calcaneus wound is nearly closed. There is still some callus that builds up around the site. The left side looks about the same in terms of dimensions, but the surface is more robust and vital-appearing. 02/16/2022: The area of the right calcaneus that was nearly closed last week has closed, but there is a small opening at the mid foot where it looks like some moisture got retained and caused some reopening. The left foot wound is narrower and shallower. Both sites have a fair amount of periwound callus and eschar. 02/24/2022: The small midfoot opening on the right calcaneus is a little bit smaller today. The left foot wound is narrower and shallower. He continues [name] accumulate periwound callus. No concern for infection. 03/01/2022: The patient came [name] clinic early because he showered and got his cast wet. Fortunately, there is no significant maceration [name] his foot but the callus softened and it looks like the wound on his left calcaneus may be a little bit wider. The wound on his right calcaneus is just a narrow slit. Continued accumulation of periwound callus bilaterally. 03/08/2022: The wound on his right calcaneus is very nearly closed, just a small pinpoint opening under a bit of eschar; the left wound has come in quite a bit, as well. It is narrower and shorter than at our last visit. Still with accumulated callus and eschar bilaterally. 03/17/2022: The right calcaneal wound is healed. The left wound is smaller and the surface itself is very clean, but there is some blue-green staining on the periwound callus, concerning for  Pseudomonas aeruginosa. 03/23/2022: The right calcaneal wound remains closed. The left wound continues [name] contract. No further blue-green staining. Small amount of callus and slough [name], [name] ([id number]) [id number].pdf Page 5 of 17 accumulation. 03/28/2022: He came in early today because he had gotten his cast wet. On inspection, the wound itself did not get wet or macerated, just a little bit of the forefoot. The wound itself is basically unchanged. 04/07/2022: The right foot wound remains closed. The left wound is the smallest that I have seen it [name] date. It is narrower and shorter. It still continues [name] accumulate slough on the surface. 04/15/2022: There is a band of epithelium now dividing the small left plantar foot wound in 2. There is still some slough on the surface. 04/21/2022: The wound continues [name] narrow. Just a little bit of slough on the surface. He seems [name] be responding well [name] endoform. 04/28/2022: Continued slow contraction of the wound. There is a little slough on the surface and some periwound callus. We have been using endoform and total contact cast. 05/05/2022: The wound appears [name] have stalled. There is slough and some periwound eschar/callus. No concern for infection, however. 05/12/2022: Unfortunately, his right foot has reopened. It is located at  the most posterior aspect of his surgical incision. The area was noted [name] have drainage coming from it when his padding was removed today. Underneath some callus and senescent skin, there is an opening. No purulent drainage or malodor. On the left foot, the wound is again unchanged. There is some light blue staining on the callus, but no malodor or purulent drainage. 10/13; right and left heel remanence of extensive plantar foot wounds. These are better than I remember by quite a big margin however he is still left with wounds on the left plantar heel and the right plantar heel. Been using endoform  bilaterally. A culture was done that showed apparently Pseudomonas but we are still waiting for the [facility] antibiotic [name] use gentamicin today. He is still very active by description I am not sure about the offloading of his noncasted right foot 10/20; both wounds right and left heel debrided not much change from last week. [name] Echevaria has arrived which is linezolid, gentamicin and ciprofloxacin we will use this with endoform. T contact cast on the left otal 06/02/2022: Both wounds are smaller today. There is still a fair amount of callus buildup around the right foot ulcer. The left is more superficial and nearly flush with the surrounding tissues. Also with slough and eschar buildup. 06/10/2022: The right sided wound appears [name] be nearly closed, if not completely so, although it is somewhat difficult [name] tell given the abnormal tissue and scarring in his foot. There is a fair amount of callus and crust accumulation. On the left, the wound looks about the same, again with callus and slough. He has an appointment next Thursday with Dr. Annamary Rummage in podiatry; I am hopeful that there may be some reconstructive options available for Mr. Mangino. 06/16/2022: Both wounds have some eschar and callus accumulation. The right sided wound is extremely narrow and barely open; the left is narrower than last week. There is a little bit of slough. He has his appointment in podiatry later today. 06/23/2022: The patient met with Dr. Annamary Rummage last week and unfortunately, there are no reconstructive options that he believes would be helpful. He did order an MRI [name] evaluate for osteomyelitis and fortunately, none was seen. The left sided wound is a little bit shorter and narrower today. The right sided wound is about the same. There is callus and eschar accumulation bilaterally. 06/29/2022: Both feet have improved from last week. There is epithelium making a valiant effort [name] creep across the surface on the left. The  right side looks like it got a little dry and the deep crevasse in his midfoot has cracked. Both have eschar and there is some slough on the left. 07/07/2022: Both feet have improved. There is epithelium completely covering the calcaneus on the right with just a small opening in the crevasse in his midfoot. On the left, the open area of tissue is smaller but he continues [name] build up callus/eschar and slough. 07/15/2022: The opening in the midfoot on the right is about the same size, covered with eschar and a little bit of slough. The open portion of the left wound is narrower and shorter with a bit of slough buildup. He admits [name] being on his feet more than recommended. 12/14; as far as I can tell everything on the right foot is closed. There is some eschar I removed some of this I cannot identify any open wound here. As usual this will be a very vulnerable area going forward. On the left this looks really  quite healthy. I was pleasantly surprised [name] see how good this looked. Wound is certainly smaller and there appears [name] be healthy epithelialization. He has been using Promogran on the right and endoform on the left. He has been offloading the right foot with a heel offloading boot and he has a running shoe on the right foot 08/04/2022: The right foot remains closed. He has a thick cushioned insole in his sneaker. The left sided wound is smaller with just some slough and eschar accumulation. He is wearing the heel off loader on this foot. 08/15/2022: The right foot remains closed. The left wound has narrowed further. There is some slough and eschar accumulation. 08/25/2022: We put him in a peg assist shoe insert and as a result, he has more epithelialization of the ulcer with minimal slough and eschar accumulation. 09/01/2022: The wound is smaller by about half this week. Still with some slough on the surface. The peg assist shoe seems [name] be doing a remarkable job of adequately offloading the  site. 09/08/2022: There is a little bit more epithelium coming in. There is some slough and callus buildup. 09/16/2022: The wound measures about the same size, but the epithelium that has grown in looks more robust and stronger. There is some slough and callus buildup. 09/23/2022: The wound remains about the same size. The skin edges are looking rather senescent. 09/29/2022: The aggressive debridement I performed last week seems [name] have been effective. The wound is smaller and has significantly less slough accumulation. 10/06/2022: There has been quite a bit of epithelialization since last week. There are still some open areas with slough accumulation. There is some callus buildup around the perimeter. 10/13/2022: Continued improvement. Minimal callus accumulation. 3/14; patient presents for follow-up. He has been using endoform [name] the wound bed without issues. He is using a surgical shoe with peg assist for offloading. 10/27/2022: The wound dimensions are stable. There is some senescent skin accumulation around the perimeter. 11/04/2022: Last week I performed a very aggressive debridement in an effort [name] stimulate the healing cascade. As has been the case when I have done this before, the wound has responded well. There has been epithelialization and contraction of the wound. There are just a couple of small open areas. 11/10/2022: Unfortunately, his foot got wet secondary [name] sweating and there has been some breakdown of the tissue, particularly the skin just adjacent [name] the main ulcer. [name], [name] ([id number]) [id number] Page 6 of 17 11/18/2022: We continue [name] struggle with moisture-related tissue breakdown around the perimeter of his wound. This has caused the thin epithelium that had formed on the surface [name] disintegrate. The underlying surface of the wound has good granulation tissue. 11/25/2022: The edges of the wound are much less macerated, but the surface is actually  looking a little bit dry. There is slough accumulation. No obvious signs of infection. 12/01/2022: The wound edges are more macerated and broken down, making the wound larger. The surface has some slough on it. 12/08/2020: The wound looks better this week. There is significantly less maceration and moisture-related tissue breakdown. There is some slough on the wound surface. 12/15/2022: The wound continues [name] improve. There is almost no moisture-related tissue breakdown. There is epithelium beginning [name] fill in again from the edges. Light slough on the wound surface. 12/22/2022: More epithelium has filled him from around the edges. There is some slough on the surface. No evidence of moisture-related tissue breakdown. 01/05/2023: There has been more epithelialization, particularly at the posterior calcaneal  aspect of the wound. There has been a little bit of moisture accumulation with minimal maceration. 01/12/2023: More epithelium has filled in. There is very minimal accumulation of slough. 01/20/2023: The wound is measuring a little bit smaller today. It did measure deeper, but this appears [name] be secondary [name] some heaped up senescent skin around the edges. The epithelium that has been present at the posterior aspect of the wound seems [name] be a bit more robust with greater integrity. 01/26/2023: He reports intense itching [name] the skin around the wound and there has been some breakdown here. It looks fungal in nature. The wound itself looks pretty good with improved epithelialization with just some slough buildup. He has a little callus along the wound edges. 02/02/2023: He has been treating the periwound skin with an over-the-counter athlete's foot medication and notes significant improvement in his pruritus. The skin looks better here, as well. The wound continues [name] epithelialize. There is light slough accumulation. 02/10/2023: The periwound skin still looks fairly angry. He reports multiple small blisters that  have opened up. He is not having the intense itching that he had prior, however. There has been more epithelialization at the calcaneus and there is minimal slough accumulation. 02/16/2023: The periwound skin is improving. An online review of dermatology rash images suggest that this may be hand-foot-and-mouth disease, isolated [name] the feet; the patient says he actually had this as a child. The calcaneus continues [name] epithelialize and the tissue was quite robust here. Very minimal slough accumulation on the open portion of the wound with a little bit of periwound callus buildup. Electronic Signature(s) Signed: 02/16/2023 10:09:52 AM By: [name] MD FACS Entered By: [name] on 02/16/2023 10:09:52 -------------------------------------------------------------------------------- Physical Exam Details Patient Name: Date of Service: [name], [name] [place] E. 02/16/2023 9:30 A M Medical Record Number: [id number] Patient Account Number: [id number] Date of Birth/Sex: Treating RN: April 10, 1976 (47 y.o. M) Primary Care Provider: Dorinda Hill Other Clinician: Referring Provider: Treating Provider/Extender: Jana Hakim in Treatment: 135 Constitutional Slightly hypertensive. . . . no acute distress. Respiratory Normal work of breathing on room air. Notes The calcaneus continues [name] epithelialize and the tissue was quite robust here. Very minimal slough accumulation on the open portion of the wound with a little bit of periwound callus buildup. Electronic Signature(s) Signed: 02/16/2023 10:10:29 AM By: [name] MD FACS Entered By: [name] on 02/16/2023 10:10:29 [name] ([id number]) [id number] Page 7 of 17 -------------------------------------------------------------------------------- Physician Orders Details Patient Name: Date of Service: [name] [name] [name] 02/16/2023 9:30 A M Medical Record Number: [id number] Patient  Account Number: [id number] Date of Birth/Sex: Treating RN: December 03, 1975 (47 y.o. [name] Primary Care Provider: Dorinda Hill Other Clinician: Referring Provider: Treating Provider/Extender: Jana Hakim in Treatment: 135 Verbal / Phone Orders: No Diagnosis Coding ICD-10 Coding Code Description [phone] Non-pressure chronic ulcer of other part of left foot with fat layer exposed Follow-up Appointments ppointment in 1 week. - Dr. Lady Gary Room 3 Return A Anesthetic Wound #2 Left Calcaneus (In clinic) Topical Lidocaine 4% applied [name] wound bed - In clinic Bathing/ Shower/ Hygiene May shower and wash wound with soap and water. Edema Control - Lymphedema / SCD / Other Bilateral Lower Extremities Avoid standing for long periods of time. Moisturize legs daily. - as needed Off-Loading Other: - keep pressure off of the bottom of your feet. Elevate legs throughout the day. Use the Shoe with the PegAssist off-loading insole Additional Orders / Instructions Follow  Nutritious Diet - Try [name] get 70-100 grams of Protein a day+ Wound Treatment Wound #2 - Calcaneus Wound Laterality: Left Cleanser: Normal Saline (Generic) 1 x Per Day/30 Days Discharge Instructions: Cleanse the wound with Normal Saline prior [name] applying a clean dressing using gauze sponges, not tissue or cotton balls. Cleanser: Wound Cleanser (Generic) 1 x Per Day/30 Days Discharge Instructions: Cleanse the wound with wound cleanser prior [name] applying a clean dressing using gauze sponges, not tissue or cotton balls. Peri-Wound Care: Ketoconazole Cream 2% 1 x Per Day/30 Days Discharge Instructions: Apply Ketoconazole as directed Peri-Wound Care: Zinc Oxide Ointment 30g tube 1 x Per Day/30 Days Discharge Instructions: Apply Zinc Oxide [name] periwound with each dressing change Topical: Gentamicin (Generic) 1 x Per Day/30 Days Topical: Ketoconazole Cream 2% 1 x Per Day/30 Days Discharge Instructions: Apply  Ketoconazole as directed Topical: compounding topical antibiotics from [facility] pharmacy 1 x Per Day/30 Days Discharge Instructions: HOLD Prim Dressing: Maxorb Extra Ag+ Alginate Dressing, 4x4.75 (in/in) (DME) (Generic) 1 x Per Day/30 Days ary Discharge Instructions: Apply [name] wound bed as instructed Secondary Dressing: Drawtex 4x4 in (DME) (Generic) 1 x Per Day/30 Days Discharge Instructions: Apply over primary dressing as directed. Secondary Dressing: Optifoam Non-Adhesive Dressing, 4x4 in (DME) (Generic) 1 x Per Day/30 Days Discharge Instructions: Apply over primary dressing as directed. Secondary Dressing: Woven Gauze Sponge, Non-Sterile 4x4 in (DME) (Generic) 1 x Per Day/30 Days Discharge Instructions: Apply over primary dressing as directed. Secured With: 74M Medipore H Soft Cloth Surgical T ape, 4 x 10 (in/yd) (DME) (Generic) 1 x Per Day/30 Days Discharge Instructions: Secure with tape as directed. [name], [name] ([id number]) [id number] Page 8 of 17 Compression Wrap: Kerlix Roll 4.5x3.1 (in/yd) (DME) (Generic) 1 x Per Day/30 Days Discharge Instructions: Apply Kerlix and Coban compression as directed. Add-Ons: [employer] Offloading Boot, Size Regular 1 x Per Day/30 Days Patient Medications llergies: No Known Drug Allergies A Notifications Medication Indication Start End 02/16/2023 gentamicin DOSE topical 0.1 % ointment - Apply [name] wound with dressing changes as directed Electronic Signature(s) Signed: 03/01/2023 11:11:00 AM By: Brenton Grills Signed: 03/01/2023 11:32:09 AM By: [name] MD FACS Previous Signature: 02/16/2023 10:13:14 AM Version By: [name] MD FACS Entered By: Brenton Grills on 02/23/2023 14:19:09 -------------------------------------------------------------------------------- Problem List Details Patient Name: Date of Service: [name], [name] [place] E. 02/16/2023 9:30 A M Medical Record Number: [id number] Patient Account  Number: [id number] Date of Birth/Sex: Treating RN: [birth year] (47 y.o. [name] Primary Care Provider: Dorinda Hill Other Clinician: Referring Provider: Treating Provider/Extender: Jana Hakim in Treatment: 135 Active Problems ICD-10 Encounter Code Description Active Date MDM Diagnosis L97.522 Non-pressure chronic ulcer of other part of left foot with fat layer exposed 09/03/2020 No Yes Inactive Problems ICD-10 Code Description Active Date Inactive Date L97.512 Non-pressure chronic ulcer of other part of right foot with fat layer exposed 09/03/2020 09/03/2020 L89.893 Pressure ulcer of other site, stage 3 07/15/2020 07/15/2020 M62.81 Muscle weakness (generalized) 07/15/2020 07/15/2020 I10 Essential (primary) hypertension 07/15/2020 07/15/2020 M86.171 Other acute osteomyelitis, right ankle and foot 09/03/2020 09/03/2020 Resolved Problems Electronic Signature(s) Signed: 02/16/2023 10:06:02 AM By: [name] MD [name] ([id number]) [phone].pdf Page 9 of 17 Entered By: [name] on 02/16/2023 10:06:02 -------------------------------------------------------------------------------- Progress Note Details Patient Name: Date of Service: [name], [name] [name] 02/16/2023 9:30 A M Medical Record Number: [id number] Patient Account Number: [id number] Date of Birth/Sex: Treating RN: [birth year] (47 y.o. M) Primary Care Provider: Dorinda Hill Other Clinician: Referring  Provider: Treating Provider/Extender: Jana Hakim in Treatment: 135 Subjective Chief Complaint Information obtained from Patient Bilateral Plantar Foot Ulcers History of Present Illness (HPI) Wounds are12/03/2020 upon evaluation today patient presents for initial inspection here in our clinic concerning issues he has been having with the bottoms of his feet bilaterally. He states these actually occurred as wounds when he was hospitalized  for 5 months secondary [name] Covid. He was apparently with tilting bed where he was in an upright position quite frequently and apparently this occurred in some way shape or form during that time. Fortunately there is no sign of active infection at this time. No fevers, chills, nausea, vomiting, or diarrhea. With that being said he still has substantial wounds on the plantar aspects of his feet Theragen require quite a bit of work [name] get these [name] heal. He has been using Santyl currently though that is been problematic both in receiving the medication as well as actually paid for it as it is become quite expensive. Prior [name] the experience with Covid the patient really did not have any major medical problems other than hypertension he does have some mild generalized weakness following the Covid experience. 07/22/2020 on evaluation today patient appears [name] be doing okay in regard [name] his foot ulcers I feel like the wound beds are showing signs of better improvement that I do believe the Iodoflex is helping in this regard. With that being said he does have a lot of drainage currently and this is somewhat blue/green in nature which is consistent with Pseudomonas. I do think a culture today would be appropriate for [place] [name] evaluate and see if that is indeed the case I would likely start him on antibiotic orally as well he is not allergic [name] Cipro knows of no issues he has had in the past 12/21; patient was admitted [name] the clinic earlier this month with bilateral presumed pressure ulcers on the bottom of his feet apparently related [name] excessive pressure from a tilt table arrangement in the intensive care unit. Patient relates this [name] being on ECMO but I am not really sure that is exactly related [name] that. I must say I have never seen anything like this. He has fairly extensive full-thickness wounds extending from his heel towards his midfoot mostly centered laterally. There is already been some healing distally. He  does not appear [name] have an arterial issue. He has been using gentamicin [name] the wound surfaces with Iodoflex [name] help with ongoing debridement 1/6; this is a patient with pressure ulcers on the bottom of his feet related [name] excessive pressure from a standing position in the intensive care unit. He is complaining of a lot of pain in the right heel. He is not a diabetic. 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 We have been using Iodoflex [name] help prepare the surfaces of both wounds for an advanced treatment product. He is nonambulatory spending most of his time in a wheelchair I have asked him not [name] propel the wheelchair with his heels 1/13; in general his wounds look better not much surface area change we have been using Iodoflex as of last week. I did an x-ray of the right heel as the patient was complaining of pain. I had some thoughts about a stress fracture perhaps Achilles tendon problems however what it showed was erosive changes along the inferior aspect of the calcaneus he now has a MRI booked for 1/20. 1/20; in general his wounds continue  [name] be better. Some improvement in the large narrow areas proximally in his foot. He is still complaining of pain in the right heel and tenderness in certain areas of this wound. His MRI is tonight. I am not just looking for osteomyelitis that was brought up on the x-ray I am wondering about stress fractures, tendon ruptures etc. He has no such findings on the left. Also noteworthy is that the patient had critical illness neuropathy and some of the discomfort may be actual improvement in nerve function I am just not sure. These wounds were initially in the setting of severe critical illness related [name] COVID-19. He was put in a standing position. He may have also been on pressors at the point contributing [name] tissue ischemia. By his description at some point these wounds were grossly necrotic extending proximally up into the Achilles  part of his heel. I do not know that I have ever really seen pictures of them like this although they may exist in epic We have ordered Tri layer Oasis. I am trying [name] stimulate some granulation in these areas. This is of course assuming the MRI is negative for infection 1/27; since the patient was last here he saw Dr. Earlene Plater of infectious disease. He is planned for vancomycin and ceftriaxone. Prior operative culture grew MSSA. Also ordered baseline lab work. He also ordered arterial studies although the ABIs in our clinic were normal as well as his clinical exam these were normal I do not think he needs [name] see vascular surgery. His ABIs at the [facility] were 1.22 in the right triphasic waveforms with a normal TBI of 1.15 on the left ABI of 1.22 with triphasic waveforms and a normal TBI of 1.08. Finally he saw Dr. Logan Bores who will follow him in for 2 months. At this point I do not think he felt that he needed a procedure on the right calcaneal bone. Dr. Earlene Plater is elected for broad-spectrum antibiotic The patient is still having pain in the right heel. He walks with a walker 2/3; wounds are generally smaller. He is tolerating his IV antibiotics. I believe this is vancomycin and ceftriaxone. We are still waiting for Oasis burn in terms of his out-of-pocket max which he should be meeting soon given the IV antibiotics, MRIs etc. I have asked him [name] check in on this. We are using silver collagen in the meantime the wounds look better 2/10; tolerating IV vancomycin and Rocephin. We are waiting [name] apply for Oasis. Although I am not really sure where he is in his out-of-pocket max. 2/17 started the first application of Oasis trilayer. Still on antibiotics. The wounds have generally look better. The area on the left has a little more surface slough requiring debridement 2/24; second application of Oasis trilayer. The wound surface granulation is generally look better. The area on the left with undermining  laterally I think is come in a bit. 10/08/2020 upon evaluation today patient is here today for [employer] application #3. Fortunately he seems [name] be doing extremely well with regard [name] this and we are seeing a lot of new epithelial growth which is great news. Fortunately there is no signs of active infection at this time. [name], [name] ([id number]) [id number] Page 10 of 17 10/16/2020 upon evaluation today patient appears [name] be doing well with regard [name] his foot ulcers. Do believe the Oasis has been of benefit for him. I do not see any signs of infection right now which is great news and I  think that he has a lot of new epithelial growth which is great [name] see as well. The patient is very pleased [name] hear all of this. I do think we can proceed with the Oasis trilayer #4 today. 3/18; not as much improvement in these areas on his heels that I was hoping. I did reapply trilateral Oasis today the tissue looks healthier but not as much fill in as I was hoping. 3/25; better looking today I think this is come in a bit the tissue looks healthier. Triple layer Oasis reapplied #6 4/1; somewhat better looking definitely better looking surface not as much change in surface area as I was hoping. He may be spending more time [place] on days then he needs [name] although he does have heel offloading boots. Triple layer Oasis reapplied #7 4/7; unfortunately apparently [facility] will not approve any further Oasis which is unfortunate since the patient did respond nicely both in terms of the condition of the wound bed as well as surface area. There is still some drainage coming from the wound but not a lot there does not appear [name] be any infection 4/15; we have been using Hydrofera Blue. He continues [name] have nice rims of epithelialization on the right greater than the left. The left the epithelialization is coming from the tip of his heel. There is moderate drainage. In this that  concerns me about a total contact cast. There is no evidence of infection 4/29; patient has been using Hydrofera Blue with dressing changes. He has no complaints or issues today. 5/5; using Hydrofera Blue. I actually think that he looks marginally better than the last time I saw this 3 weeks ago. There are rims of epithelialization on the left thumb coming from the medial side on the right. Using Hydrofera Blue 5/12; using Hydrofera Blue. These continue [name] make improvements in surface area. His drainage was not listed as severe I therefore went ahead and put a cast on the left foot. Right foot we will continue [name] dress his previous 5/16; back for first total contact cast change. He did not tolerate this particularly well cast injury on the anterior tibia among other issues. Difficulty sleeping. I talked him about this in some detail and afterwards is elected [name] continue. I told him I would like [name] have a cast on for 3 weeks [name] see if this is going [name] help at all. I think he agreed 5/19; I think the wound is better. There is no tunneling towards his midfoot. The undermining medially also looks better. He has a rim of new skin distally. I think we are making progress here. The area on the left also continues [name] look somewhat better [name] me using Hydrofera Blue. He has a list of complaints about the cast but none of them seem serious 5/26; patient presents for 1 week follow-up. He has been using a total contact cast and tolerating this well. Hydrofera Blue is the main dressing used. He denies signs of infection. 6/2 Hydrofera Blue total contact cast on the left. These were large ulcers that formed in intensive care unit where the patient was recovering from COVID. May have had something to do with being ventilated in an upright positiono Pressors etc. We have been able [name] get the areas down considerably and a viable surface. There is some epithelialization in both sides. Note made of drainage 6/9;  changed [name] [facility] last time because of drainage. He arrives with better looking surfaces and  dimensions on the left than the right. Paradoxically the right actually probes more towards his midfoot the left is largely close down but both of these look improved. Using a total contact cast on the left 6/16; complex wounds on his bilateral plantar heels which were initially pressure injury from a stay in the ICU with COVID. We have been using silver alginate most recently. His dimensions of come in quite dramatically however not recently. We have been putting the left foot in a total contact cast 6/23; complex wounds on the bilateral plantar heels. I been putting the left in the cast paradoxically the area on the right is the one that is going towards closure at a faster rate. Quite a bit of drainage on the left. The patient went [name] see Dr. Logan Bores who said he was going [name] standby for skin grafts. I had actually considered sending him for skin grafts however he would be mandatorily off his feet for a period of weeks [name] months. I am thinking that the area on the right is going [name] close on its own the area on the left has been more stubborn even though we have him in a total contact cast 6/30; took him out of a total contact cast last week is the right heel seem [name] be making better progress than the left where I was placing the cast. We are using silver alginate. Both wounds are smaller right greater than left 7/12; both wounds look as though they are making some progress. We are using silver alginate. Heel offloading boots 7/26; very gradual progress especially on the right. Using silver alginate. He is wearing heel offloading boots 8/18; he continues [name] close these wounds down very gradually. Using silver alginate. The problem polymen being definitive about this is areas of what appears [name] be callus around the margins. This is not a 100% of the area but certainly sizable especially on the right 9/1;  bilateral plantar feet wounds secondary [name] prolonged pressure while being ventilated for COVID-19 in an upright position. Essentially pressure ulcers on the bottom of his feet. He is made substantial progress using silver alginate. 9/14; bilateral plantar feet wounds secondary [name] prolonged pressure. Making progress using silver alginate. 9/29 bilateral plantar feet wounds secondary [name] prolonged pressure. I changed him [name] Iodoflex last week. MolecuLight showing reddened blush fluorescence 10/11; patient presents for follow-up. He has no issues or complaints today. He denies signs of infection. He continues [name] use Iodoflex and antibiotic ointment [name] the wound beds. 10/27; 2-week follow-up. No evidence of infection. He has callus and thick dry skin around the wound margins we have been using Iodoflex and Bactroban which was in response [name] a moderate left MolecuLight reddish blush fluorescence. 11/10; 2-week follow-up. Wound margins again have thick callus however the measurements of the actual wound sites are a lot smaller. Everything looks reasonably healthy here. We have been using Iodoflex He was approved for prime matrix but I have elected [name] delay this given the improvement in the surface area. Hopefully I will not regret that decision as were getting close [name] the end of the year in terms of insurance payment 12/8; 2-week follow-up. Wounds are generally smaller in size. These were initially substantial wounds extending into the forefoot all the way into the heel on the bilateral plantar feet. They are now both located on the plantar heel distal aspect both of these have a lot of callus around the wounds I used a #5 curette [name] remove this on  the right and the left also some subcutaneous debris [name] try and get the wound edges were using Iodoflex. He has heel offloading shoe 12/22; 2-week follow-up. Not really much improvement. He has thick callus around the outer edges of both wounds. I remove this  there is some nonviable subcutaneous tissue as well. We have been using Iodoflex. Her intake nurse and myself spontaneously thought of a total contact cast I went back in May. At that time we really were not seeing much of an improvement with a cast although the wound was in a much different situation I would like [name] retry this in 2 weeks and I discussed this with the patient 08/12/2021; the patient has had some improvement with the Iodoflex. The the area on the left heel plantar more improved than the right. I had [name] put him in a total contact cast on the left although I decided [name] put that off for 2 weeks. I am going [name] change his primary dressing [name] silver collagen. I think in both areas he has had some improvement most of the healing seems [name] be more proximal in the heel. The wounds are in the mid aspect. A lot of thick callus on the right heel however. 1/19; we are using silver collagen on both plantar heel areas. He has had some improvement today. The left did not require any debridement. He still had some eschar on the right that was debrided but both seem [name] have contracted. I did not put it total contact cast on him today 2/2 we have been using silver collagen. The area on the right plantar heel has areas that appear [name] be epithelialized interspersed with dry flaking callus and dry skin. I removed this. This really looks better than on the other side. On the left still a large area with raised edges and debris on the surface. 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 He has no issues or complaints today. 2/9; not much change in the left foot wound with 1 week of a cast we are using silver collagen. Silver collagen on the right side. The right side has been the better wound surface. We will reapply the total contact cast on the left 2/16; not much improvement on  either side I been using silver collagen with a total contact cast on the left. I'm changing the Hydrofera Blue still with a total contact cast on the left 2/23; some improvement on both sides. Disappointing that he has thick callus around the area that we are putting in a total contact cast on the left. We've been using Hydrofera Blue on both wound areas. This is a man who at essentially pressure ulcers in addition [name] ischemia caused by medications [name] support his blood pressure (pressors) in the ICU. He was being ventilated in the standing position for severe Covid. A Shiley the wounds extended across his entire foot but are now localized [name] his plantar heels bilaterally. We have made progress however neither areas healed. I continue [name] think the total contact cast is helped albeit painstakingly slowly. He has never wanted a plastic surgery consult although I don't know that they would be interested in grafting in area in this location. [name], [name] ([id number]) [id number] Page 11 of 17 10/07/2021: Continued improvement bilaterally. There is still some callus around the left wound, despite the total contact cast.  He has some increased pain in his right midfoot around 1 particular area. This has been painful in the past but seems [name] be a little bit worse. When his cast was removed today, there was an area on the heel of the left foot that looks a bit macerated. He is also complaining of pain in his left thigh and hip which he thinks is secondary [name] the limb length discrepancy caused by the cast. 10/14/2021: He continues [name] improve. A little bit less callus around the left wound. He continues [name] endorse pain in his right midfoot, but this is not as significant as it was last week. The maceration on his left heel is improved. 10/21/2021: Continued improvement [name] both wounds. The maceration on his left heel is no longer evident. Less callus bilaterally. Epithelialization  progressing. 10/28/2021: Significant improvement this week. The right sided wound is nearly closed with just a small open area at the middle. No maceration seen on the left heel. Continued epithelialization on both sides. No concern for infection. 11/04/2021: T oday, the wounds were measured a little bit differently and come out as larger, but I actually think they are about the same [name] potentially even smaller, particularly on the left. He continues [name] accumulate some callus on the right. 11/11/2021: T oday, the patient is expressing some concern that the left wound, despite being in the total contact cast, is not progressing at the same rate as the 1 on the right. He is interested in trying a week without the cast [name] see how the wound does. The wounds are roughly the same size as last week, with the right perhaps being a little bit smaller. He continues [name] build up callus on both sites. 11/18/2021: Last week, I permitted the patient [name] go without his total contact cast, just [name] see if the cast was really making any difference. Today, both wounds have deteriorated [name] some extent, suggesting that the cast is providing benefit, at least on the left. Both are larger and have accumulated callus, slough, and other debris. 11/26/2021: I debrided both wounds quite aggressively last week in an effort [name] stimulate the healing cascade. This appears [name] have been effective as the left sided wound is a full centimeter shorter in length. Although the right was measured slightly larger, on inspection, it looks as though an area of epithelialized tissue was included in the measurements. We have been using PolyMem Ag on the wound surfaces with a total contact cast [name] the left. 12/02/2021: It appears that the intake personnel are including epithelialized tissue in his wound measurements; the right wound is almost completely epithelialized; there is just a crater at the proximal midfoot with some open areas. On the left, he  has built up some callus, but the overall wound surface looks good. There is some senescent skin around the wound margin. He has been in PolyMem Ag bilaterally with a total contact cast on the left. 12/09/2021: The right wound is nearly closed; there is just a small open area at the mid calcaneus. On the left, the wound is smaller with minimal callus buildup. No significant drainage. 12/16/2021: The right calcaneal wound remains minimally open at the mid calcaneus; the rest has epithelialized. On the left, the wound is also a little bit smaller. There is some senescent tissue on the periphery. He is getting his first application of a trial skin substitute called Vendaje today. 12/23/2021: The wound on his right calcaneus is nearly closed; there is just a small  area at the most distal aspect of the calcaneus that is open. On the left, the area where we applied [name] the skin substitute has a healthier-looking bed of granulation tissue. The wound dimensions are not significantly different on this side but the wound surface is improved. 12/30/2021: The wound on the right calcaneus has not changed significantly aside from some accumulation of callus. On the left, the open area is smaller and continues [name] have an improved surface. He continues [name] accumulate callus around the wound. He is here for his third application of Vendaje. 01/06/2022: The right calcaneal wound is down [name] just a couple of millimeters. He continues [name] accumulate periwound callus. He unfortunately got his cast wet earlier in the week and his left foot is macerated, resulting in some superficial skin loss just distal [name] the open wound. The open wound itself, however, is much smaller and has a healthier appearing surface. He is here for his fourth application of Vendaje. 01/13/2022: The right calcaneal wound is about the same. Unfortunately, once again, his cast got wet and his foot is again macerated. This is caused the left calcaneal wound [name]  enlarge. He is here for his fifth application of Vendaje. 01/20/2022: The right calcaneal wound is very small. There is some periwound callus accumulation. He purchased a new cast protector last week and this has been effective in avoiding the maceration that has been occurring on the left. The left calcaneal wound is narrower and has a healthy and viable-appearing surface. He is here for his 6 application of Vendaje. 01/27/2022: The right calcaneal wound is down [name] just a pinhole. There is some periwound slough and callus. On the left, the wound is narrower and shorter by about a centimeter. The surface is robust and viable-appearing. Unfortunately, the rep for the trial skin substitute product did not provide any for [place] [name] use today. 02/04/2022: The right calcaneal wound remains unchanged. There is more accumulated callus. On the left, although the intake nurse measured it a little bit longer, it looks about the same [name] me. The surface has a layer of slough, but underneath this, there is good granulation tissue. 02/10/2022: The right calcaneus wound is nearly closed. There is still some callus that builds up around the site. The left side looks about the same in terms of dimensions, but the surface is more robust and vital-appearing. 02/16/2022: The area of the right calcaneus that was nearly closed last week has closed, but there is a small opening at the mid foot where it looks like some moisture got retained and caused some reopening. The left foot wound is narrower and shallower. Both sites have a fair amount of periwound callus and eschar. 02/24/2022: The small midfoot opening on the right calcaneus is a little bit smaller today. The left foot wound is narrower and shallower. He continues [name] accumulate periwound callus. No concern for infection. 03/01/2022: The patient came [name] clinic early because he showered and got his cast wet. Fortunately, there is no significant maceration [name] his foot but the  callus softened and it looks like the wound on his left calcaneus may be a little bit wider. The wound on his right calcaneus is just a narrow slit. Continued accumulation of periwound callus bilaterally. 03/08/2022: The wound on his right calcaneus is very nearly closed, just a small pinpoint opening under a bit of eschar; the left wound has come in quite a bit, as well. It is narrower and shorter than at our last visit.  Still with accumulated callus and eschar bilaterally. 03/17/2022: The right calcaneal wound is healed. The left wound is smaller and the surface itself is very clean, but there is some blue-green staining on the periwound callus, concerning for Pseudomonas aeruginosa. 03/23/2022: The right calcaneal wound remains closed. The left wound continues [name] contract. No further blue-green staining. Small amount of callus and slough accumulation. 03/28/2022: He came in early today because he had gotten his cast wet. On inspection, the wound itself did not get wet or macerated, just a little bit of the forefoot. The wound itself is basically unchanged. 04/07/2022: The right foot wound remains closed. The left wound is the smallest that I have seen it [name] date. It is narrower and shorter. It still continues [name] accumulate slough on the surface. [name], [name] ([id number]) [id number] Page 12 of 17 04/15/2022: There is a band of epithelium now dividing the small left plantar foot wound in 2. There is still some slough on the surface. 04/21/2022: The wound continues [name] narrow. Just a little bit of slough on the surface. He seems [name] be responding well [name] endoform. 04/28/2022: Continued slow contraction of the wound. There is a little slough on the surface and some periwound callus. We have been using endoform and total contact cast. 05/05/2022: The wound appears [name] have stalled. There is slough and some periwound eschar/callus. No concern for infection, however. 05/12/2022:  Unfortunately, his right foot has reopened. It is located at the most posterior aspect of his surgical incision. The area was noted [name] have drainage coming from it when his padding was removed today. Underneath some callus and senescent skin, there is an opening. No purulent drainage or malodor. On the left foot, the wound is again unchanged. There is some light blue staining on the callus, but no malodor or purulent drainage. 10/13; right and left heel remanence of extensive plantar foot wounds. These are better than I remember by quite a big margin however he is still left with wounds on the left plantar heel and the right plantar heel. Been using endoform bilaterally. A culture was done that showed apparently Pseudomonas but we are still waiting for the [facility] antibiotic [name] use gentamicin today. He is still very active by description I am not sure about the offloading of his noncasted right foot 10/20; both wounds right and left heel debrided not much change from last week. [name] Echevaria has arrived which is linezolid, gentamicin and ciprofloxacin we will use this with endoform. T contact cast on the left otal 06/02/2022: Both wounds are smaller today. There is still a fair amount of callus buildup around the right foot ulcer. The left is more superficial and nearly flush with the surrounding tissues. Also with slough and eschar buildup. 06/10/2022: The right sided wound appears [name] be nearly closed, if not completely so, although it is somewhat difficult [name] tell given the abnormal tissue and scarring in his foot. There is a fair amount of callus and crust accumulation. On the left, the wound looks about the same, again with callus and slough. He has an appointment next Thursday with Dr. Annamary Rummage in podiatry; I am hopeful that there may be some reconstructive options available for Mr. Mattheis. 06/16/2022: Both wounds have some eschar and callus accumulation. The right sided wound is extremely narrow  and barely open; the left is narrower than last week. There is a little bit of slough. He has his appointment in podiatry later today. 06/23/2022: The patient met with Dr. Annamary Rummage last  week and unfortunately, there are no reconstructive options that he believes would be helpful. He did order an MRI [name] evaluate for osteomyelitis and fortunately, none was seen. The left sided wound is a little bit shorter and narrower today. The right sided wound is about the same. There is callus and eschar accumulation bilaterally. 06/29/2022: Both feet have improved from last week. There is epithelium making a valiant effort [name] creep across the surface on the left. The right side looks like it got a little dry and the deep crevasse in his midfoot has cracked. Both have eschar and there is some slough on the left. 07/07/2022: Both feet have improved. There is epithelium completely covering the calcaneus on the right with just a small opening in the crevasse in his midfoot. On the left, the open area of tissue is smaller but he continues [name] build up callus/eschar and slough. 07/15/2022: The opening in the midfoot on the right is about the same size, covered with eschar and a little bit of slough. The open portion of the left wound is narrower and shorter with a bit of slough buildup. He admits [name] being on his feet more than recommended. 12/14; as far as I can tell everything on the right foot is closed. There is some eschar I removed some of this I cannot identify any open wound here. As usual this will be a very vulnerable area going forward. On the left this looks really quite healthy. I was pleasantly surprised [name] see how good this looked. Wound is certainly smaller and there appears [name] be healthy epithelialization. He has been using Promogran on the right and endoform on the left. He has been offloading the right foot with a heel offloading boot and he has a running shoe on the right foot 08/04/2022: The right  foot remains closed. He has a thick cushioned insole in his sneaker. The left sided wound is smaller with just some slough and eschar accumulation. He is wearing the heel off loader on this foot. 08/15/2022: The right foot remains closed. The left wound has narrowed further. There is some slough and eschar accumulation. 08/25/2022: We put him in a peg assist shoe insert and as a result, he has more epithelialization of the ulcer with minimal slough and eschar accumulation. 09/01/2022: The wound is smaller by about half this week. Still with some slough on the surface. The peg assist shoe seems [name] be doing a remarkable job of adequately offloading the site. 09/08/2022: There is a little bit more epithelium coming in. There is some slough and callus buildup. 09/16/2022: The wound measures about the same size, but the epithelium that has grown in looks more robust and stronger. There is some slough and callus buildup. 09/23/2022: The wound remains about the same size. The skin edges are looking rather senescent. 09/29/2022: The aggressive debridement I performed last week seems [name] have been effective. The wound is smaller and has significantly less slough accumulation. 10/06/2022: There has been quite a bit of epithelialization since last week. There are still some open areas with slough accumulation. There is some callus buildup around the perimeter. 10/13/2022: Continued improvement. Minimal callus accumulation. 3/14; patient presents for follow-up. He has been using endoform [name] the wound bed without issues. He is using a surgical shoe with peg assist for offloading. 10/27/2022: The wound dimensions are stable. There is some senescent skin accumulation around the perimeter. 11/04/2022: Last week I performed a very aggressive debridement in an effort [name] stimulate the  healing cascade. As has been the case when I have done this before, the wound has responded well. There has been epithelialization and contraction of  the wound. There are just a couple of small open areas. 11/10/2022: Unfortunately, his foot got wet secondary [name] sweating and there has been some breakdown of the tissue, particularly the skin just adjacent [name] the main ulcer. 11/18/2022: We continue [name] struggle with moisture-related tissue breakdown around the perimeter of his wound. This has caused the thin epithelium that had formed on the surface [name] disintegrate. The underlying surface of the wound has good granulation tissue. 11/25/2022: The edges of the wound are much less macerated, but the surface is actually looking a little bit dry. There is slough accumulation. No obvious signs of infection. 12/01/2022: The wound edges are more macerated and broken down, making the wound larger. The surface has some slough on it. [name], [name] ([id number]) [id number] Page 13 of 17 12/08/2020: The wound looks better this week. There is significantly less maceration and moisture-related tissue breakdown. There is some slough on the wound surface. 12/15/2022: The wound continues [name] improve. There is almost no moisture-related tissue breakdown. There is epithelium beginning [name] fill in again from the edges. Light slough on the wound surface. 12/22/2022: More epithelium has filled him from around the edges. There is some slough on the surface. No evidence of moisture-related tissue breakdown. 01/05/2023: There has been more epithelialization, particularly at the posterior calcaneal aspect of the wound. There has been a little bit of moisture accumulation with minimal maceration. 01/12/2023: More epithelium has filled in. There is very minimal accumulation of slough. 01/20/2023: The wound is measuring a little bit smaller today. It did measure deeper, but this appears [name] be secondary [name] some heaped up senescent skin around the edges. The epithelium that has been present at the posterior aspect of the wound seems [name] be a bit more robust with  greater integrity. 01/26/2023: He reports intense itching [name] the skin around the wound and there has been some breakdown here. It looks fungal in nature. The wound itself looks pretty good with improved epithelialization with just some slough buildup. He has a little callus along the wound edges. 02/02/2023: He has been treating the periwound skin with an over-the-counter athlete's foot medication and notes significant improvement in his pruritus. The skin looks better here, as well. The wound continues [name] epithelialize. There is light slough accumulation. 02/10/2023: The periwound skin still looks fairly angry. He reports multiple small blisters that have opened up. He is not having the intense itching that he had prior, however. There has been more epithelialization at the calcaneus and there is minimal slough accumulation. 02/16/2023: The periwound skin is improving. An online review of dermatology rash images suggest that this may be hand-foot-and-mouth disease, isolated [name] the feet; the patient says he actually had this as a child. The calcaneus continues [name] epithelialize and the tissue was quite robust here. Very minimal slough accumulation on the open portion of the wound with a little bit of periwound callus buildup. Patient History Information obtained from Patient. Family History Cancer - Maternal Grandparents, Diabetes - Father,Paternal Grandparents, Heart Disease - Maternal Grandparents, Hypertension - Father,Paternal Grandparents, Lung Disease - Siblings, No family history of Hereditary Spherocytosis, Kidney Disease, Seizures, Stroke, Thyroid Problems, Tuberculosis. Social History Never smoker, Marital Status - Married, Alcohol Use - Never, Drug Use - No History, Caffeine Use - Daily - tea, soda. Medical History Eyes Denies history of Cataracts, Glaucoma, Optic Neuritis  Ear/Nose/Mouth/Throat Denies history of Chronic sinus problems/congestion, Middle ear  problems Hematologic/Lymphatic Denies history of Anemia, Hemophilia, Human Immunodeficiency Virus, Lymphedema, Sickle Cell Disease Respiratory Patient has history of Asthma Denies history of Aspiration, Chronic Obstructive Pulmonary Disease (COPD), Pneumothorax, Sleep Apnea, Tuberculosis Cardiovascular Patient has history of Angina - with COVID, Hypertension Denies history of Arrhythmia, Congestive Heart Failure, Coronary Artery Disease, Deep Vein Thrombosis, Hypotension, Myocardial Infarction, Peripheral Arterial Disease, Peripheral Venous Disease, Phlebitis, Vasculitis Gastrointestinal Denies history of Cirrhosis , Colitis, Crohns, Hepatitis A, Hepatitis B, Hepatitis C Endocrine Denies history of Type I Diabetes, Type II Diabetes Genitourinary Denies history of End Stage Renal Disease Immunological Denies history of Lupus Erythematosus, Raynauds, Scleroderma Integumentary (Skin) Denies history of History of Burn Musculoskeletal Denies history of Gout, Rheumatoid Arthritis, Osteoarthritis, Osteomyelitis Neurologic Denies history of Dementia, Neuropathy, Quadriplegia, Paraplegia, Seizure Disorder Oncologic Denies history of Received Chemotherapy, Received Radiation Psychiatric Denies history of Anorexia/bulimia, Confinement Anxiety Hospitalization/Surgery History - COVID PNA 07/22/2019- 11/14/2019. - 03/27/2020 wound debridement/ skin graft. Medical A Surgical History Notes nd Constitutional Symptoms (General Health) COVID PNA 07/22/2019-11/14/2019 V[facility] ECMO, foot drop left foot , Genitourinary kidney stone Psychiatric anxiety [name], [name] ([id number]) [id number] Page 14 of 17 Objective Constitutional Slightly hypertensive. no acute distress. Vitals Time Taken: 9:34 AM, Height: 69 in, Weight: 280 lbs, BMI: 41.3, Temperature: 98 F, Pulse: 75 bpm, Respiratory Rate: 18 breaths/min, Blood Pressure: 142/77 mmHg. Respiratory Normal work of breathing  on room air. General Notes: The calcaneus continues [name] epithelialize and the tissue was quite robust here. Very minimal slough accumulation on the open portion of the wound with a little bit of periwound callus buildup. Integumentary (Hair, Skin) Wound #2 status is Open. Original cause of wound was Pressure Injury. The date acquired was: 10/07/2019. The wound has been in treatment 135 weeks. The wound is located on the Left Calcaneus. The wound measures 3.8cm length x 1.8cm width x 0.1cm depth; 5.372cm^2 area and 0.537cm^3 volume. There is Fat Layer (Subcutaneous Tissue) exposed. There is no tunneling or undermining noted. There is a medium amount of serosanguineous drainage noted. The wound margin is thickened. There is large (67-100%) pink granulation within the wound bed. There is a small (1-33%) amount of necrotic tissue within the wound bed including Adherent Slough. The periwound skin appearance had no abnormalities noted for moisture. The periwound skin appearance exhibited: Scarring. The periwound skin appearance did not exhibit: Callus, Crepitus, Excoriation, Induration, Rash, Atrophie Blanche, Cyanosis, Ecchymosis, Hemosiderin Staining, Mottled, Pallor, Rubor, Erythema. Periwound temperature was noted as No Abnormality. The periwound has tenderness on palpation. Assessment Active Problems ICD-10 Non-pressure chronic ulcer of other part of left foot with fat layer exposed Procedures Wound #2 Pre-procedure diagnosis of Wound #2 is a Pressure Ulcer located on the Left Calcaneus . There was a Selective/Open Wound Non-Viable Tissue Debridement with a total area of 4.03 sq cm performed by [name], MD. With the following instrument(s): Curette [name] remove Non-Viable tissue/material. Material removed includes Callus and Slough and after achieving pain control using Lidocaine 4% T opical Solution. No specimens were taken. A time out was conducted at 10:00, prior [name] the start of the  procedure. A Minimum amount of bleeding was controlled with Pressure. The procedure was tolerated well with a pain level of 0 throughout and a pain level of 0 following the procedure. Post Debridement Measurements: 3.8cm length x 1.8cm width x 0.1cm depth; 0.537cm^3 volume. Post debridement Stage noted as Category/Stage III. Character of Wound/Ulcer Post Debridement is improved. Post procedure Diagnosis  Wound #2: Same as Pre-Procedure General Notes: scribed for Dr. Lady Gary by Brenton Grills, RN. Plan Follow-up Appointments: Return Appointment in 1 week. - Dr. Lady Gary Room 3 Anesthetic: Wound #2 Left Calcaneus: (In clinic) Topical Lidocaine 4% applied [name] wound bed - In clinic Bathing/ Shower/ Hygiene: May shower and wash wound with soap and water. Edema Control - Lymphedema / SCD / Other: Avoid standing for long periods of time. Moisturize legs daily. - as needed Off-Loading: Other: - keep pressure off of the bottom of your feet. Elevate legs throughout the day. Use the Shoe with the PegAssist off-loading insole Additional Orders / Instructions: Follow Nutritious Diet - Try [name] get 70-100 grams of Protein a day+ The following medication(s) was prescribed: gentamicin topical 0.1 % ointment Apply [name] wound with dressing changes as directed starting 02/16/2023 WOUND #2: - Calcaneus Wound Laterality: Left Cleanser: Normal Saline (Generic) 1 x Per Day/30 Days Discharge Instructions: Cleanse the wound with Normal Saline prior [name] applying a clean dressing using gauze sponges, not tissue or cotton balls. Cleanser: Wound Cleanser (Generic) 1 x Per Day/30 Days Discharge Instructions: Cleanse the wound with wound cleanser prior [name] applying a clean dressing using gauze sponges, not tissue or cotton balls. Peri-Wound Care: Ketoconazole Cream 2% 1 x Per Day/30 Days Discharge Instructions: Apply Ketoconazole as directed Peri-Wound Care: Zinc Oxide Ointment 30g tube 1 x Per Day/30 Days Discharge Instructions:  Apply Zinc Oxide [name] periwound with each dressing change [name], QUAIN ([id number]) [id number] Page 15 of 17 Topical: Gentamicin 1 x Per Day/30 Days Topical: Ketoconazole Cream 2% 1 x Per Day/30 Days Discharge Instructions: Apply Ketoconazole as directed Topical: compounding topical antibiotics from [facility] pharmacy 1 x Per Day/30 Days Discharge Instructions: HOLD Prim Dressing: Maxorb Extra Ag+ Alginate Dressing, 4x4.75 (in/in) 1 x Per Day/30 Days ary Discharge Instructions: Apply [name] wound bed as instructed Secondary Dressing: Drawtex 4x4 in (Generic) 1 x Per Day/30 Days Discharge Instructions: Apply over primary dressing as directed. Secondary Dressing: Optifoam Non-Adhesive Dressing, 4x4 in (Generic) 1 x Per Day/30 Days Discharge Instructions: Apply over primary dressing as directed. Secondary Dressing: Woven Gauze Sponge, Non-Sterile 4x4 in 1 x Per Day/30 Days Discharge Instructions: Apply over primary dressing as directed. Secured With: 28M Medipore H Soft Cloth Surgical T ape, 4 x 10 (in/yd) 1 x Per Day/30 Days Discharge Instructions: Secure with tape as directed. Com pression Wrap: Kerlix Roll 4.5x3.1 (in/yd) (Generic) 1 x Per Day/30 Days Discharge Instructions: Apply Kerlix and Coban compression as directed. Add-Ons: [employer] Offloading Boot, Size Regular 1 x Per Day/30 Days 02/16/2023: The calcaneus continues [name] epithelialize and the tissue was quite robust here. Very minimal slough accumulation on the open portion of the wound with a little bit of periwound callus buildup. I think the rash may have been hand-foot-and-mouth disease which should resolve on its own. I used a curette [name] debride callus and slough from his wound. We will continue topical gentamicin with silver alginate and drawtex. Continue ketoconazole and zinc oxide [name] the periwound. Follow-up in 1 week. Electronic Signature(s) Signed: 02/16/2023 10:14:45 AM By: [name] MD  FACS Entered By: [name] on 02/16/2023 10:14:45 -------------------------------------------------------------------------------- HxROS Details Patient Name: Date of Service: [name], [name] [place] E. 02/16/2023 9:30 A M Medical Record Number: [id number] Patient Account Number: [id number] Date of Birth/Sex: Treating RN: May 09, 1976 (47 y.o. M) Primary Care Provider: Dorinda Hill Other Clinician: Referring Provider: Treating Provider/Extender: Jana Hakim in Treatment: 135 Information Obtained From Patient Constitutional Symptoms (General Health)  Medical History: Past Medical History Notes: COVID PNA 07/22/2019-11/14/2019 VENT ECMO, foot drop left foot , Eyes Medical History: Negative for: Cataracts; Glaucoma; Optic Neuritis Ear/Nose/Mouth/Throat Medical History: Negative for: Chronic sinus problems/congestion; Middle ear problems Hematologic/Lymphatic Medical History: Negative for: Anemia; Hemophilia; Human Immunodeficiency Virus; Lymphedema; Sickle Cell Disease Respiratory Medical History: Positive for: Asthma Negative for: Aspiration; Chronic Obstructive Pulmonary Disease (COPD); Pneumothorax; Sleep Apnea; Tuberculosis KSHAUN, GARGAN ([id number]) [id number] Page 16 of 17 Cardiovascular Medical History: Positive for: Angina - with COVID; Hypertension Negative for: Arrhythmia; Congestive Heart Failure; Coronary Artery Disease; Deep Vein Thrombosis; Hypotension; Myocardial Infarction; Peripheral Arterial Disease; Peripheral Venous Disease; Phlebitis; Vasculitis Gastrointestinal Medical History: Negative for: Cirrhosis ; Colitis; Crohns; Hepatitis A; Hepatitis B; Hepatitis C Endocrine Medical History: Negative for: Type I Diabetes; Type II Diabetes Genitourinary Medical History: Negative for: End Stage Renal Disease Past Medical History Notes: kidney stone Immunological Medical History: Negative for: Lupus Erythematosus;  Raynauds; Scleroderma Integumentary (Skin) Medical History: Negative for: History of Burn Musculoskeletal Medical History: Negative for: Gout; Rheumatoid Arthritis; Osteoarthritis; Osteomyelitis Neurologic Medical History: Negative for: Dementia; Neuropathy; Quadriplegia; Paraplegia; Seizure Disorder Oncologic Medical History: Negative for: Received Chemotherapy; Received Radiation Psychiatric Medical History: Negative for: Anorexia/bulimia; Confinement Anxiety Past Medical History Notes: anxiety Immunizations Pneumococcal Vaccine: Received Pneumococcal Vaccination: No Implantable Devices None Hospitalization / Surgery History Type of Hospitalization/Surgery COVID PNA 07/22/2019- 11/14/2019 03/27/2020 wound debridement/ skin graft Family and Social History Cancer: Yes - Maternal Grandparents; Diabetes: Yes - Father,Paternal Grandparents; Heart Disease: Yes - Maternal Grandparents; Hereditary Spherocytosis: No; Hypertension: Yes - Father,Paternal Grandparents; Kidney Disease: No; Lung Disease: Yes - Siblings; Seizures: No; Stroke: No; Thyroid Problems: No; Tuberculosis: No; Never smoker; Marital Status - Married; Alcohol Use: Never; Drug Use: No History; Caffeine Use: Daily - tea, soda; Financial Concerns: No; Food, Clothing or Shelter Needs: No; Support System Lacking: No; Transportation Concerns: No Electronic Signature(s) Signed: 02/16/2023 12:08:46 PM By: [name] MD [name] ([id number]) [id number] Page 17 of 17 Entered By: [name] on 02/16/2023 10:09:59 -------------------------------------------------------------------------------- SuperBill Details Patient Name: Date of Service: [name], [name] [name] 02/16/2023 Medical Record Number: [id number] Patient Account Number: [id number] Date of Birth/Sex: Treating RN: January 17, 1976 (47 y.o. [name] Primary Care Provider: Dorinda Hill Other Clinician: Referring  Provider: Treating Provider/Extender: Jana Hakim in Treatment: 135 Diagnosis Coding ICD-10 Codes Code Description [phone] Non-pressure chronic ulcer of other part of left foot with fat layer exposed Facility Procedures : CPT4 Code: [id number] Description: [phone] - [facility]BR[facility] WOUND 1ST 20 SQ CM OR < ICD-10 Diagnosis Description L97.522 Non-pressure chronic ulcer of other part of left foot with fat layer exposed Modifier: Quantity: 1 Physician Procedures : CPT4 Code Description Modifier [id number] - WC PHYS LEVEL 4 - EST PT 25 ICD-10 Diagnosis Description L97.522 Non-pressure chronic ulcer of other part of left foot with fat layer exposed Quantity: 1 : [id number] - WC PHYS DEBR WO ANESTH 20 SQ CM ICD-10 Diagnosis Description L97.522 Non-pressure chronic ulcer of other part of left foot with fat layer exposed Quantity: 1 Electronic Signature(s) Signed: 02/16/2023 10:15:11 AM By: [name] MD FACS Entered By: [name] on 02/16/2023 10:15:11

## 2023-02-22 NOTE — Progress Notes (Signed)
ROMIN, DIVITA ([id number]) [phone].pdf Page 1 of 7 Visit Report for 01/20/2023 Arrival Information Details Patient Name: Date of Service: [name], TO [name] 01/20/2023 8:00 A M Medical Record Number: [id number] Patient Account Number: [id number] Date of Birth/Sex: Treating RN: Jun 09, 1976 (47 y.o. [name] Primary Care Nephtali Docken: Dorinda Hill Other Clinician: Referring Swathi Dauphin: Treating Jamila Slatten/Extender: Jana Hakim in Treatment: 131 Visit Information History Since Last Visit All ordered tests and consults were completed: Yes Patient Arrived: Wheel Chair Added or deleted any medications: No Arrival Time: 08:08 Any new allergies or adverse reactions: No Accompanied By: family Had a fall or experienced change in No Transfer Assistance: None activities of daily living that may affect Patient Identification Verified: Yes risk of falls: Secondary Verification Process Completed: Yes Signs or symptoms of abuse/neglect since last visito No Patient Requires Transmission-Based Precautions: No Hospitalized since last visit: No Patient Has Alerts: No Implantable device outside of the clinic excluding No cellular tissue based products placed in the center since last visit: Has Dressing in Place as Prescribed: Yes Pain Present Now: No Electronic Signature(s) Signed: 02/22/2023 8:00:09 AM By: Brenton Grills Entered By: Brenton Grills on 01/20/2023 08:09:05 -------------------------------------------------------------------------------- Encounter Discharge Information Details Patient Name: Date of Service: [name], [name] 01/20/2023 8:00 A M Medical Record Number: [id number] Patient Account Number: [id number] Date of Birth/Sex: Treating RN: April 13, 1976 (47 y.o. [name] Primary Care Arijana Narayan: Dorinda Hill Other Clinician: Referring Dillan Lunden: Treating Josef Tourigny/Extender: Jana Hakim in Treatment:  131 Encounter Discharge Information Items Post Procedure Vitals Discharge Condition: Stable Temperature (F): 98.2 Ambulatory Status: Wheelchair Pulse (bpm): 68 Discharge Destination: Home Respiratory Rate (breaths/min): 18 Transportation: Private Auto Blood Pressure (mmHg): 135/77 Accompanied By: family Schedule Follow-up Appointment: Yes Clinical Summary of Care: Patient Declined Electronic Signature(s) Signed: 02/22/2023 8:00:09 AM By: Brenton Grills Entered By: Brenton Grills on 01/20/2023 08:53:32 [name] ([id number]) [id number].pdf Page 2 of 7 -------------------------------------------------------------------------------- Lower Extremity Assessment Details Patient Name: Date of Service: [name], TO [name] 01/20/2023 8:00 A M Medical Record Number: [id number] Patient Account Number: [id number] Date of Birth/Sex: Treating RN: 1976-04-03 (47 y.o. [name] Primary Care Brentton Wardlow: Dorinda Hill Other Clinician: Referring Euan Wandler: Treating Brookelle Pellicane/Extender: Jana Hakim in Treatment: 131 Edema Assessment Assessed: [name]: No] Franne Forts: No] Edema: [Left: N] [Right: o] Calf Left: Right: Point of Measurement: 29 cm From Medial Instep 42.8 cm Ankle Left: Right: Point of Measurement: 9 cm From Medial Instep 24.7 cm Vascular Assessment Pulses: Dorsalis Pedis Palpable: [Left:Yes] Electronic Signature(s) Signed: 02/22/2023 8:00:09 AM By: Brenton Grills Entered By: Brenton Grills on 01/20/2023 08:12:13 -------------------------------------------------------------------------------- Multi Wound Chart Details Patient Name: Date of Service: [name], [name] 01/20/2023 8:00 A M Medical Record Number: [id number] Patient Account Number: [id number] Date of Birth/Sex: Treating RN: 1976-03-07 (47 y.o. M) Primary Care Levi Klaiber: Dorinda Hill Other Clinician: Referring Terrisha Lopata: Treating Airyonna Franklyn/Extender: Jana Hakim in Treatment: 131 Vital Signs Height(in): 69 Pulse(bpm): 79 Weight(lbs): 280 Blood Pressure(mmHg): 135/77 Body Mass Index(BMI): 41.3 Temperature(F): 98 Respiratory Rate(breaths/min): 18 [2:Photos:] [N/A:N/A] Left Calcaneus N/A N/A Wound Location: Pressure Injury N/A N/A Wounding Event: Pressure Ulcer N/A N/A Primary Etiology: Asthma, Angina, Hypertension N/A N/A Comorbid History: 10/07/2019 N/A N/A Date Acquired: 131 N/A N/A Weeks of Treatment: Open N/A N/A Wound Status: No N/A N/A Wound Recurrence: 3.2x1x0.4 N/A N/A Measurements L x W x D (cm) 2.513 N/A N/A A (cm) : rea 1.005 N/A N/A Volume (cm) : 90.70% N/A  N/A % Reduction in A rea: 96.30% N/A N/A % Reduction in Volume: Category/Stage III N/A N/A Classification: Medium N/A N/A Exudate A mount: Serosanguineous N/A N/A Exudate Type: red, brown N/A N/A Exudate Color: Distinct, outline attached N/A N/A Wound Margin: Medium (34-66%) N/A N/A Granulation A mount: Red, Pink N/A N/A Granulation Quality: Medium (34-66%) N/A N/A Necrotic A mount: Fat Layer (Subcutaneous Tissue): Yes N/A N/A Exposed Structures: Fascia: No Tendon: No Muscle: No Joint: No Bone: No Debridement - Selective/Open Wound N/A N/A Debridement: Pre-procedure Verification/Time Out 08:27 N/A N/A Taken: Lidocaine 4% Topical Solution N/A N/A Pain Control: Slough N/A N/A Tissue Debrided: Non-Viable Tissue N/A N/A Level: 2.51 N/A N/A Debridement A (sq cm): rea Curette N/A N/A Instrument: Minimum N/A N/A Bleeding: Pressure N/A N/A Hemostasis A chieved: 0 N/A N/A Procedural Pain: 0 N/A N/A Post Procedural Pain: Procedure was tolerated well N/A N/A Debridement Treatment Response: 3.2x1x0.4 N/A N/A Post Debridement Measurements L x W x D (cm) 1.005 N/A N/A Post Debridement Volume: (cm) Category/Stage III N/A N/A Post Debridement Stage: Excoriation: No N/A N/A Periwound Skin Texture: Induration: No Callus:  No Crepitus: No Rash: No Scarring: No Dry/Scaly: Yes N/A N/A Periwound Skin Moisture: Maceration: No Hemosiderin Staining: Yes N/A N/A Periwound Skin Color: Atrophie Blanche: No Cyanosis: No Ecchymosis: No Erythema: No Mottled: No Pallor: No Rubor: No No Abnormality N/A N/A Temperature: Debridement N/A N/A Procedures Performed: Treatment Notes Wound #2 (Calcaneus) Wound Laterality: Left Cleanser Normal Saline Discharge Instruction: Cleanse the wound with Normal Saline prior to applying a clean dressing using gauze sponges, not tissue or cotton balls. Wound Cleanser Discharge Instruction: Cleanse the wound with wound cleanser prior to applying a clean dressing using gauze sponges, not tissue or cotton balls. Peri-Wound Care Zinc Oxide Ointment 30g tube Discharge Instruction: Apply Zinc Oxide to periwound with each dressing change Topical Gentamicin compounding topical antibiotics from [facility] pharmacy Discharge Instruction: JESAIAH, FABIANO ([id number]) [phone].pdf Page 4 of 7 Primary Dressing Maxorb Extra Ag+ Alginate Dressing, 4x4.75 (in/in) Discharge Instruction: Apply to wound bed as instructed Secondary Dressing Drawtex 4x4 in Discharge Instruction: Apply over primary dressing as directed. Optifoam Non-Adhesive Dressing, 4x4 in Discharge Instruction: Apply over primary dressing as directed. Woven Gauze Sponge, Non-Sterile 4x4 in Discharge Instruction: Apply over primary dressing as directed. Secured With 81M Medipore H Soft Cloth Surgical T ape, 4 x 10 (in/yd) Discharge Instruction: Secure with tape as directed. Compression Wrap Kerlix Roll 4.5x3.1 (in/yd) Discharge Instruction: Apply Kerlix and Coban compression as directed. Compression Stockings Add-Ons [place] Vascular Offloading Boot, Size Regular Electronic Signature(s) Signed: 01/20/2023 8:57:19 AM By: [name] MD FACS Entered By: [name] on 01/20/2023  08:57:18 -------------------------------------------------------------------------------- Multi-Disciplinary Care Plan Details Patient Name: Date of Service: [name], [name] 01/20/2023 8:00 A M Medical Record Number: [id number] Patient Account Number: [id number] Date of Birth/Sex: Treating RN: 1976-04-10 (47 y.o. [name] Primary Care Etha Stambaugh: Dorinda Hill Other Clinician: Referring Zaul Hubers: Treating Dorothie Wah/Extender: Jana Hakim in Treatment: 131 Multidisciplinary Care Plan reviewed with physician Active Inactive Wound/Skin Impairment Nursing Diagnoses: Impaired tissue integrity Knowledge deficit related to ulceration/compromised skin integrity Goals: Patient/caregiver will verbalize understanding of skin care regimen Date Initiated: 07/15/2020 Target Resolution Date: 05/08/2023 Goal Status: Active Ulcer/skin breakdown will have a volume reduction of 30% by week 4 Date Initiated: 07/15/2020 Date Inactivated: 08/20/2020 Target Resolution Date: 09/03/2020 Goal Status: Unmet Unmet Reason: no major changes. Ulcer/skin breakdown will heal within 14 weeks Date Initiated: 12/04/2020 Date Inactivated: 12/10/2020 Target Resolution  Date: 12/10/2020 Unmet Reason: wounds still open at 14 Goal Status: Unmet weeks and today 21 weeks. Interventions: Assess patient/caregiver ability to obtain necessary supplies Assess patient/caregiver ability to perform ulcer/skin care regimen upon admission and as needed Assess ulceration(s) every visit MYSON, LEVI ([id number]) [phone].pdf Page 5 of 7 Provide education on ulcer and skin care Treatment Activities: Skin care regimen initiated : 07/15/2020 Topical wound management initiated : 07/15/2020 Notes: Electronic Signature(s) Signed: 02/22/2023 8:00:09 AM By: Brenton Grills Entered By: Brenton Grills on 01/20/2023  08:18:08 -------------------------------------------------------------------------------- Pain Assessment Details Patient Name: Date of Service: [name], [name] 01/20/2023 8:00 A M Medical Record Number: [id number] Patient Account Number: [id number] Date of Birth/Sex: Treating RN: 1976-07-05 (47 y.o. [name] Primary Care Shelva Hetzer: Dorinda Hill Other Clinician: Referring Lynnwood Beckford: Treating Teneil Shiller/Extender: Jana Hakim in Treatment: 131 Active Problems Location of Pain Severity and Description of Pain Patient Has Paino No Site Locations Pain Management and Medication Current Pain Management: Electronic Signature(s) Signed: 02/22/2023 8:00:09 AM By: Brenton Grills Entered By: Brenton Grills on 01/20/2023 08:11:55 -------------------------------------------------------------------------------- Patient/Caregiver Education Details Patient Name: Date of Service: [name], [name] 6/14/2024andnbsp8:00 A M Medical Record Number: [id number] Patient Account Number: [id number] Date of Birth/Gender: Treating RN: July 19, 1976 (47 y.o. [name] Primary Care Physician: Dorinda Hill Other Clinician: WILLIES, LAVIOLETTE ([id number]) [id number] Page 6 of 7 Referring Physician: Treating Physician/Extender: Jana Hakim in Treatment: 131 Education Assessment Education Provided To: Patient and Caregiver Education Topics Provided Wound/Skin Impairment: Methods: Explain/Verbal Responses: State content correctly Electronic Signature(s) Signed: 02/22/2023 8:00:09 AM By: Brenton Grills Entered By: Brenton Grills on 01/20/2023 08:18:41 -------------------------------------------------------------------------------- Wound Assessment Details Patient Name: Date of Service: [name], [name] 01/20/2023 8:00 A M Medical Record Number: [id number] Patient Account Number: [id number] Date of Birth/Sex: Treating  RN: 06/11/1976 (47 y.o. [name] Primary Care Willmar Stockinger: Dorinda Hill Other Clinician: Referring Zadok Holaway: Treating Mariell Nester/Extender: Jana Hakim in Treatment: 131 Wound Status Wound Number: 2 Primary Etiology: Pressure Ulcer Wound Location: Left Calcaneus Wound Status: Open Wounding Event: Pressure Injury Comorbid History: Asthma, Angina, Hypertension Date Acquired: 10/07/2019 Weeks Of Treatment: 131 Clustered Wound: No Photos Wound Measurements Length: (cm) 3.2 Width: (cm) 1 Depth: (cm) 0.4 Area: (cm) 2.513 Volume: (cm) 1.005 % Reduction in Area: 90.7% % Reduction in Volume: 96.3% Tunneling: No Undermining: No Wound Description Classification: Category/Stage III Wound Margin: Distinct, outline attached Exudate Amount: Medium Exudate Type: Serosanguineous Exudate Color: red, brown Foul Odor After Cleansing: No Slough/Fibrino No Wound Bed AYDON, SWAMY ([id number]) [phone].pdf Page 7 of 7 Granulation Amount: Medium (34-66%) Exposed Structure Granulation Quality: Red, Pink Fascia Exposed: No Necrotic Amount: Medium (34-66%) Fat Layer (Subcutaneous Tissue) Exposed: Yes Necrotic Quality: Adherent Slough Tendon Exposed: No Muscle Exposed: No Joint Exposed: No Bone Exposed: No Periwound Skin Texture Texture Color No Abnormalities Noted: No No Abnormalities Noted: No Callus: No Atrophie Blanche: No Crepitus: No Cyanosis: No Excoriation: No Ecchymosis: No Induration: No Erythema: No Rash: No Hemosiderin Staining: Yes Scarring: No Mottled: No Pallor: No Moisture Rubor: No No Abnormalities Noted: No Dry / Scaly: Yes Temperature / Pain Maceration: No Temperature: No Abnormality Electronic Signature(s) Signed: 02/22/2023 8:00:09 AM By: Brenton Grills Entered By: Brenton Grills on 01/20/2023 08:17:16 -------------------------------------------------------------------------------- Vitals  Details Patient Name: Date of Service: [name], [name] 01/20/2023 8:00 A M Medical Record Number: [id number] Patient Account Number: [id number] Date of Birth/Sex: Treating RN: 01/11/1976 (47 y.o. [name] Primary Care  Julyssa Kyer: Dorinda Hill Other Clinician: Referring Trevione Wert: Treating Vegas Coffin/Extender: Jana Hakim in Treatment: 131 Vital Signs Time Taken: 08:05 Temperature (F): 98 Height (in): 69 Pulse (bpm): 79 Weight (lbs): 280 Respiratory Rate (breaths/min): 18 Body Mass Index (BMI): 41.3 Blood Pressure (mmHg): 135/77 Reference Range: 80 - 120 mg / dl Electronic Signature(s) Signed: 02/22/2023 8:00:09 AM By: Brenton Grills Entered By: Brenton Grills on 01/20/2023 08:11:46

## 2023-02-22 NOTE — Progress Notes (Signed)
AASHIR, UMHOLTZ ([id number]) [id number] Page 1 of 17 Visit Report for 01/20/2023 Chief Complaint Document Details Patient Name: Date of Service: [name], TO [name] 01/20/2023 8:00 A M Medical Record Number: [id number] Patient Account Number: [id number] Date of Birth/Sex: Treating RN: Dec 09, 1975 (47 y.o. M) Primary Care Provider: Dorinda Hill Other Clinician: Referring Provider: Treating Provider/Extender: Jana Hakim in Treatment: 131 Information Obtained from: Patient Chief Complaint Bilateral Plantar Foot Ulcers Electronic Signature(s) Signed: 01/20/2023 8:57:27 AM By: [name] MD FACS Entered By: [name] on 01/20/2023 08:57:27 -------------------------------------------------------------------------------- Debridement Details Patient Name: Date of Service: [name], [name] 01/20/2023 8:00 A M Medical Record Number: [id number] Patient Account Number: [id number] Date of Birth/Sex: Treating RN: 07-20-1976 (47 y.o. [name] Primary Care Provider: Dorinda Hill Other Clinician: Referring Provider: Treating Provider/Extender: Jana Hakim in Treatment: 131 Debridement Performed for Assessment: Wound #2 Left Calcaneus Performed By: Physician [name], MD Debridement Type: Debridement Level of Consciousness (Pre-procedure): Awake and Alert Pre-procedure Verification/Time Out Yes - 08:27 Taken: Start Time: 08:28 Pain Control: Lidocaine 4% T opical Solution Percent of Wound Bed Debrided: 100% T Area Debrided (cm): otal 2.51 Tissue and other material debrided: Viable, Non-Viable, Slough, Slough Level: Non-Viable Tissue Debridement Description: Selective/Open Wound Instrument: Curette Bleeding: Minimum Hemostasis Achieved: Pressure End Time: 08:30 Procedural Pain: 0 Post Procedural Pain: 0 Response to Treatment: Procedure was tolerated well Level of Consciousness (Post- Awake  and Alert procedure): Post Debridement Measurements of Total Wound Length: (cm) 3.2 Stage: Category/Stage III Width: (cm) 1 Depth: (cm) 0.4 Volume: (cm) 1.005 Character of Wound/Ulcer Post Debridement: Improved POSEY, PETRIK ([id number]) [id number] Page 2 of 17 Post Procedure Diagnosis Same as Pre-procedure Notes Scribed for Dr Lady Gary by Brenton Grills RN. Electronic Signature(s) Signed: 01/20/2023 9:00:42 AM By: [name] MD FACS Signed: 02/22/2023 8:00:09 AM By: Brenton Grills Entered By: Brenton Grills on 01/20/2023 08:32:00 -------------------------------------------------------------------------------- HPI Details Patient Name: Date of Service: [name], [name] 01/20/2023 8:00 A M Medical Record Number: [id number] Patient Account Number: [id number] Date of Birth/Sex: Treating RN: June 28, 1976 (47 y.o. M) Primary Care Provider: Dorinda Hill Other Clinician: Referring Provider: Treating Provider/Extender: Jana Hakim in Treatment: 131 History of Present Illness HPI Description: Wounds are12/03/2020 upon evaluation today patient presents for initial inspection here in our clinic concerning issues he has been having with the bottoms of his feet bilaterally. He states these actually occurred as wounds when he was hospitalized for 5 months secondary to Covid. He was apparently with tilting bed where he was in an upright position quite frequently and apparently this occurred in some way shape or form during that time. Fortunately there is no sign of active infection at this time. No fevers, chills, nausea, vomiting, or diarrhea. With that being said he still has substantial wounds on the plantar aspects of his feet Theragen require quite a bit of work to get these to heal. He has been using Santyl currently though that is been problematic both in receiving the medication as well as actually paid for it as it is become quite  expensive. Prior to the experience with Covid the patient really did not have any major medical problems other than hypertension he does have some mild generalized weakness following the Covid experience. 07/22/2020 on evaluation today patient appears to be doing okay in regard to his foot ulcers I feel like the wound beds are showing signs of better improvement that I do believe the Iodoflex is  helping in this regard. With that being said he does have a lot of drainage currently and this is somewhat blue/green in nature which is consistent with Pseudomonas. I do think a culture today would be appropriate for [place] to evaluate and see if that is indeed the case I would likely start him on antibiotic orally as well he is not allergic to Cipro knows of no issues he has had in the past 12/21; patient was admitted to the clinic earlier this month with bilateral presumed pressure ulcers on the bottom of his feet apparently related to excessive pressure from a tilt table arrangement in the intensive care unit. Patient relates this to being on ECMO but I am not really sure that is exactly related to that. I must say I have never seen anything like this. He has fairly extensive full-thickness wounds extending from his heel towards his midfoot mostly centered laterally. There is already been some healing distally. He does not appear to have an arterial issue. He has been using gentamicin to the wound surfaces with Iodoflex to help with ongoing debridement 1/6; this is a patient with pressure ulcers on the bottom of his feet related to excessive pressure from a standing position in the intensive care unit. He is complaining of a lot of pain in the right heel. He is not a diabetic. 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 We have been using Iodoflex to help prepare the surfaces of both wounds for an advanced treatment product. He is nonambulatory spending most of his time in a wheelchair  I have asked him not to propel the wheelchair with his heels 1/13; in general his wounds look better not much surface area change we have been using Iodoflex as of last week. I did an x-ray of the right heel as the patient was complaining of pain. I had some thoughts about a stress fracture perhaps Achilles tendon problems however what it showed was erosive changes along the inferior aspect of the calcaneus he now has a MRI booked for 1/20. 1/20; in general his wounds continue to be better. Some improvement in the large narrow areas proximally in his foot. He is still complaining of pain in the right heel and tenderness in certain areas of this wound. His MRI is tonight. I am not just looking for osteomyelitis that was brought up on the x-ray I am wondering about stress fractures, tendon ruptures etc. He has no such findings on the left. Also noteworthy is that the patient had critical illness neuropathy and some of the discomfort may be actual improvement in nerve function I am just not sure. These wounds were initially in the setting of severe critical illness related to COVID-19. He was put in a standing position. He may have also been on pressors at the point contributing to tissue ischemia. By his description at some point these wounds were grossly necrotic extending proximally up into the Achilles part of his heel. I do not know that I have ever really seen pictures of them like this although they may exist in epic We have ordered Tri layer Oasis. I am trying to stimulate some granulation in these areas. This is of course assuming the MRI is negative for infection 1/27; since the patient was last here he saw Dr. Earlene Plater of infectious disease. He is planned for vancomycin and ceftriaxone. Prior operative culture grew MSSA. Also ordered baseline lab work. He also ordered arterial studies although the ABIs in our clinic  were normal as well as his clinical exam these were normal I do not think he  needs to see vascular surgery. His ABIs at the [facility] were 1.22 in the right triphasic waveforms with a normal TBI of 1.15 on the left ABI of 1.22 with triphasic waveforms and a normal TBI of 1.08. Finally he saw Dr. Logan Bores who will follow him in for 2 months. At this point I do not think he felt that he needed a procedure on the right calcaneal bone. Dr. Earlene Plater is elected for broad-spectrum antibiotic The patient is still having pain in the right heel. He walks with a walker 2/3; wounds are generally smaller. He is tolerating his IV antibiotics. I believe this is vancomycin and ceftriaxone. We are still waiting for Oasis burn in terms of his out-of-pocket max which he should be meeting soon given the IV antibiotics, MRIs etc. I have asked him to check in on this. We are using silver collagen in the meantime the wounds look better ZEKIAH, CARUTH ([id number]) [phone].pdf Page 3 of 17 2/10; tolerating IV vancomycin and Rocephin. We are waiting to apply for Oasis. Although I am not really sure where he is in his out-of-pocket max. 2/17 started the first application of Oasis trilayer. Still on antibiotics. The wounds have generally look better. The area on the left has a little more surface slough requiring debridement 2/24; second application of Oasis trilayer. The wound surface granulation is generally look better. The area on the left with undermining laterally I think is come in a bit. 10/08/2020 upon evaluation today patient is here today for [employer] application #3. Fortunately he seems to be doing extremely well with regard to this and we are seeing a lot of new epithelial growth which is great news. Fortunately there is no signs of active infection at this time. 10/16/2020 upon evaluation today patient appears to be doing well with regard to his foot ulcers. Do believe the Oasis has been of benefit for him. I do not see any signs of infection right now which is great  news and I think that he has a lot of new epithelial growth which is great to see as well. The patient is very pleased to hear all of this. I do think we can proceed with the Oasis trilayer #4 today. 3/18; not as much improvement in these areas on his heels that I was hoping. I did reapply trilateral Oasis today the tissue looks healthier but not as much fill in as I was hoping. 3/25; better looking today I think this is come in a bit the tissue looks healthier. Triple layer Oasis reapplied #6 4/1; somewhat better looking definitely better looking surface not as much change in surface area as I was hoping. He may be spending more time [place] on days then he needs to although he does have heel offloading boots. Triple layer Oasis reapplied #7 4/7; unfortunately apparently [facility] will not approve any further Oasis which is unfortunate since the patient did respond nicely both in terms of the condition of the wound bed as well as surface area. There is still some drainage coming from the wound but not a lot there does not appear to be any infection 4/15; we have been using Hydrofera Blue. He continues to have nice rims of epithelialization on the right greater than the left. The left the epithelialization is coming from the tip of his heel. There is moderate drainage. In this that concerns me  about a total contact cast. There is no evidence of infection 4/29; patient has been using Hydrofera Blue with dressing changes. He has no complaints or issues today. 5/5; using Hydrofera Blue. I actually think that he looks marginally better than the last time I saw this 3 weeks ago. There are rims of epithelialization on the left thumb coming from the medial side on the right. Using Hydrofera Blue 5/12; using Hydrofera Blue. These continue to make improvements in surface area. His drainage was not listed as severe I therefore went ahead and put a cast on the left foot. Right foot we will continue  to dress his previous 5/16; back for first total contact cast change. He did not tolerate this particularly well cast injury on the anterior tibia among other issues. Difficulty sleeping. I talked him about this in some detail and afterwards is elected to continue. I told him I would like to have a cast on for 3 weeks to see if this is going to help at all. I think he agreed 5/19; I think the wound is better. There is no tunneling towards his midfoot. The undermining medially also looks better. He has a rim of new skin distally. I think we are making progress here. The area on the left also continues to look somewhat better to me using Hydrofera Blue. He has a list of complaints about the cast but none of them seem serious 5/26; patient presents for 1 week follow-up. He has been using a total contact cast and tolerating this well. Hydrofera Blue is the main dressing used. He denies signs of infection. 6/2 Hydrofera Blue total contact cast on the left. These were large ulcers that formed in intensive care unit where the patient was recovering from COVID. May have had something to do with being ventilated in an upright positiono Pressors etc. We have been able to get the areas down considerably and a viable surface. There is some epithelialization in both sides. Note made of drainage 6/9; changed to [facility] last time because of drainage. He arrives with better looking surfaces and dimensions on the left than the right. Paradoxically the right actually probes more towards his midfoot the left is largely close down but both of these look improved. Using a total contact cast on the left 6/16; complex wounds on his bilateral plantar heels which were initially pressure injury from a stay in the ICU with COVID. We have been using silver alginate most recently. His dimensions of come in quite dramatically however not recently. We have been putting the left foot in a total contact cast 6/23; complex  wounds on the bilateral plantar heels. I been putting the left in the cast paradoxically the area on the right is the one that is going towards closure at a faster rate. Quite a bit of drainage on the left. The patient went to see Dr. Logan Bores who said he was going to standby for skin grafts. I had actually considered sending him for skin grafts however he would be mandatorily off his feet for a period of weeks to months. I am thinking that the area on the right is going to close on its own the area on the left has been more stubborn even though we have him in a total contact cast 6/30; took him out of a total contact cast last week is the right heel seem to be making better progress than the left where I was placing the cast. We are using silver alginate.  Both wounds are smaller right greater than left 7/12; both wounds look as though they are making some progress. We are using silver alginate. Heel offloading boots 7/26; very gradual progress especially on the right. Using silver alginate. He is wearing heel offloading boots 8/18; he continues to close these wounds down very gradually. Using silver alginate. The problem polymen being definitive about this is areas of what appears to be callus around the margins. This is not a 100% of the area but certainly sizable especially on the right 9/1; bilateral plantar feet wounds secondary to prolonged pressure while being ventilated for COVID-19 in an upright position. Essentially pressure ulcers on the bottom of his feet. He is made substantial progress using silver alginate. 9/14; bilateral plantar feet wounds secondary to prolonged pressure. Making progress using silver alginate. 9/29 bilateral plantar feet wounds secondary to prolonged pressure. I changed him to Iodoflex last week. MolecuLight showing reddened blush fluorescence 10/11; patient presents for follow-up. He has no issues or complaints today. He denies signs of infection. He continues to use  Iodoflex and antibiotic ointment to the wound beds. 10/27; 2-week follow-up. No evidence of infection. He has callus and thick dry skin around the wound margins we have been using Iodoflex and Bactroban which was in response to a moderate left MolecuLight reddish blush fluorescence. 11/10; 2-week follow-up. Wound margins again have thick callus however the measurements of the actual wound sites are a lot smaller. Everything looks reasonably healthy here. We have been using Iodoflex He was approved for prime matrix but I have elected to delay this given the improvement in the surface area. Hopefully I will not regret that decision as were getting close to the end of the year in terms of insurance payment 12/8; 2-week follow-up. Wounds are generally smaller in size. These were initially substantial wounds extending into the forefoot all the way into the heel on the bilateral plantar feet. They are now both located on the plantar heel distal aspect both of these have a lot of callus around the wounds I used a #5 curette to remove this on the right and the left also some subcutaneous debris to try and get the wound edges were using Iodoflex. He has heel offloading shoe 12/22; 2-week follow-up. Not really much improvement. He has thick callus around the outer edges of both wounds. I remove this there is some nonviable subcutaneous tissue as well. We have been using Iodoflex. Her intake nurse and myself spontaneously thought of a total contact cast I went back in May. At that time we really were not seeing much of an improvement with a cast although the wound was in a much different situation I would like to retry this in 2 weeks and I discussed this with the patient 08/12/2021; the patient has had some improvement with the Iodoflex. The the area on the left heel plantar more improved than the right. I had to put him in a total contact cast on the left although I decided to put that off for 2 weeks. I am  going to change his primary dressing to silver collagen. I think in both areas he has had some improvement most of the healing seems to be more proximal in the heel. The wounds are in the mid aspect. A lot of thick callus on the right heel however. 1/19; we are using silver collagen on both plantar heel areas. He has had some improvement today. The left did not require any debridement. He still had some  eschar on the right that was debrided but both seem to have contracted. I did not put it total contact cast on him today 2/2 we have been using silver collagen. The area on the right plantar heel has areas that appear to be epithelialized interspersed with dry flaking callus and dry skin. I removed this. This really looks better than on the other side. On the left still a large area with raised edges and debris on the surface. 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 He has no issues or complaints today. 2/9; not much change in the left foot wound with 1 week of a cast we are using silver collagen. Silver collagen on the right side. The right side has been the better wound surface. We will reapply the total contact cast on the left HOMMER, CUNLIFFE ([id number]) [id number].pdf Page 4 of 17 2/16; not much improvement on either side I been using silver collagen with a total contact cast on the left. I'm changing the Hydrofera Blue still with a total contact cast on the left 2/23; some improvement on both sides. Disappointing that he has thick callus around the area that we are putting in a total contact cast on the left. We've been using Hydrofera Blue on both wound areas. This is a man who at essentially pressure ulcers in addition to ischemia caused by medications to support his blood pressure (pressors) in the ICU. He was being ventilated in the  standing position for severe Covid. A Shiley the wounds extended across his entire foot but are now localized to his plantar heels bilaterally. We have made progress however neither areas healed. I continue to think the total contact cast is helped albeit painstakingly slowly. He has never wanted a plastic surgery consult although I don't know that they would be interested in grafting in area in this location. 10/07/2021: Continued improvement bilaterally. There is still some callus around the left wound, despite the total contact cast. He has some increased pain in his right midfoot around 1 particular area. This has been painful in the past but seems to be a little bit worse. When his cast was removed today, there was an area on the heel of the left foot that looks a bit macerated. He is also complaining of pain in his left thigh and hip which he thinks is secondary to the limb length discrepancy caused by the cast. 10/14/2021: He continues to improve. A little bit less callus around the left wound. He continues to endorse pain in his right midfoot, but this is not as significant as it was last week. The maceration on his left heel is improved. 10/21/2021: Continued improvement to both wounds. The maceration on his left heel is no longer evident. Less callus bilaterally. Epithelialization progressing. 10/28/2021: Significant improvement this week. The right sided wound is nearly closed with just a small open area at the middle. No maceration seen on the left heel. Continued epithelialization on both sides. No concern for infection. 11/04/2021: T oday, the wounds were measured a little bit differently and come out as larger, but I actually think they are about the same to potentially even smaller, particularly on the left. He continues to accumulate some callus on the right. 11/11/2021: T oday, the patient is expressing some concern that the left wound, despite being in the total contact cast, is not  progressing at the same rate as the 1 on the right. He is interested in trying a week without the cast to see how the wound does. The wounds are roughly the same size as last week, with the right perhaps being a little bit smaller. He continues to build up callus on both sites. 11/18/2021: Last week, I permitted the patient to go without his total contact cast, just to see if the cast was really making any difference. Today, both wounds have deteriorated to some extent, suggesting that the cast is providing benefit, at least on the left. Both are larger and have accumulated callus, slough, and other debris. 11/26/2021: I debrided both wounds quite aggressively last week in an effort to stimulate the healing cascade. This appears to have been effective as the left sided wound is a full centimeter shorter in length. Although the right was measured slightly larger, on inspection, it looks as though an area of epithelialized tissue was included in the measurements. We have been using PolyMem Ag on the wound surfaces with a total contact cast to the left. 12/02/2021: It appears that the intake personnel are including epithelialized tissue in his wound measurements; the right wound is almost completely epithelialized; there is just a crater at the proximal midfoot with some open areas. On the left, he has built up some callus, but the overall wound surface looks good. There is some senescent skin around the wound margin. He has been in PolyMem Ag bilaterally with a total contact cast on the left. 12/09/2021: The right wound is nearly closed; there is just a small open area at the mid calcaneus. On the left, the wound is smaller with minimal callus buildup. No significant drainage. 12/16/2021: The right calcaneal wound remains minimally open at the mid calcaneus; the rest has epithelialized. On the left, the wound is also a little bit smaller. There is some senescent tissue on the periphery. He is getting his  first application of a trial skin substitute called Vendaje today. 12/23/2021: The wound on his right calcaneus is nearly closed; there is just a small area at the most distal aspect of the calcaneus that is open. On the left, the area where we applied to the skin substitute has a healthier-looking bed of granulation tissue. The wound dimensions are not significantly different on this side but the wound surface is improved. 12/30/2021: The wound on the right calcaneus has not changed significantly aside from some accumulation of callus. On the left, the open area is smaller and continues to have an improved surface. He continues to accumulate callus around the wound. He is here for his third application of Vendaje. 01/06/2022: The right calcaneal wound is down to just a couple of millimeters. He continues to accumulate periwound callus. He unfortunately got his cast wet earlier in the week and his left foot is macerated, resulting in some superficial skin loss just distal to the open wound. The open wound itself, however, is much smaller and has a healthier appearing surface. He is here for his fourth application of Vendaje. 01/13/2022: The right calcaneal wound is about the same. Unfortunately, once again, his cast got wet and his foot is again macerated. This is caused the left calcaneal wound to enlarge. He is here for his fifth application of Vendaje. 01/20/2022: The right calcaneal wound is very small. There is some periwound callus accumulation. He purchased a new cast protector last week and this has been effective in avoiding the maceration that has been occurring  on the left. The left calcaneal wound is narrower and has a healthy and viable-appearing surface. He is here for his 6 application of Vendaje. 01/27/2022: The right calcaneal wound is down to just a pinhole. There is some periwound slough and callus. On the left, the wound is narrower and shorter by about a centimeter. The surface is robust  and viable-appearing. Unfortunately, the rep for the trial skin substitute product did not provide any for [place] to use today. 02/04/2022: The right calcaneal wound remains unchanged. There is more accumulated callus. On the left, although the intake nurse measured it a little bit longer, it looks about the same to me. The surface has a layer of slough, but underneath this, there is good granulation tissue. 02/10/2022: The right calcaneus wound is nearly closed. There is still some callus that builds up around the site. The left side looks about the same in terms of dimensions, but the surface is more robust and vital-appearing. 02/16/2022: The area of the right calcaneus that was nearly closed last week has closed, but there is a small opening at the mid foot where it looks like some moisture got retained and caused some reopening. The left foot wound is narrower and shallower. Both sites have a fair amount of periwound callus and eschar. 02/24/2022: The small midfoot opening on the right calcaneus is a little bit smaller today. The left foot wound is narrower and shallower. He continues to accumulate periwound callus. No concern for infection. 03/01/2022: The patient came to clinic early because he showered and got his cast wet. Fortunately, there is no significant maceration to his foot but the callus softened and it looks like the wound on his left calcaneus may be a little bit wider. The wound on his right calcaneus is just a narrow slit. Continued accumulation of periwound callus bilaterally. 03/08/2022: The wound on his right calcaneus is very nearly closed, just a small pinpoint opening under a bit of eschar; the left wound has come in quite a bit, as well. It is narrower and shorter than at our last visit. Still with accumulated callus and eschar bilaterally. 03/17/2022: The right calcaneal wound is healed. The left wound is smaller and the surface itself is very clean, but there is some blue-green  staining on the periwound callus, concerning for Pseudomonas aeruginosa. YOHAN, SAMONS ([id number]) [id number] Page 5 of 17 03/23/2022: The right calcaneal wound remains closed. The left wound continues to contract. No further blue-green staining. Small amount of callus and slough accumulation. 03/28/2022: He came in early today because he had gotten his cast wet. On inspection, the wound itself did not get wet or macerated, just a little bit of the forefoot. The wound itself is basically unchanged. 04/07/2022: The right foot wound remains closed. The left wound is the smallest that I have seen it to date. It is narrower and shorter. It still continues to accumulate slough on the surface. 04/15/2022: There is a band of epithelium now dividing the small left plantar foot wound in 2. There is still some slough on the surface. 04/21/2022: The wound continues to narrow. Just a little bit of slough on the surface. He seems to be responding well to endoform. 04/28/2022: Continued slow contraction of the wound. There is a little slough on the surface and some periwound callus. We have been using endoform and total contact cast. 05/05/2022: The wound appears to have stalled. There is slough and some periwound eschar/callus. No concern for infection, however. 05/12/2022: Unfortunately,  his right foot has reopened. It is located at the most posterior aspect of his surgical incision. The area was noted to have drainage coming from it when his padding was removed today. Underneath some callus and senescent skin, there is an opening. No purulent drainage or malodor. On the left foot, the wound is again unchanged. There is some light blue staining on the callus, but no malodor or purulent drainage. 10/13; right and left heel remanence of extensive plantar foot wounds. These are better than I remember by quite a big margin however he is still left with wounds on the left plantar heel and the  right plantar heel. Been using endoform bilaterally. A culture was done that showed apparently Pseudomonas but we are still waiting for the [facility] antibiotic to use gentamicin today. He is still very active by description I am not sure about the offloading of his noncasted right foot 10/20; both wounds right and left heel debrided not much change from last week. [name] Echevaria has arrived which is linezolid, gentamicin and ciprofloxacin we will use this with endoform. T contact cast on the left otal 06/02/2022: Both wounds are smaller today. There is still a fair amount of callus buildup around the right foot ulcer. The left is more superficial and nearly flush with the surrounding tissues. Also with slough and eschar buildup. 06/10/2022: The right sided wound appears to be nearly closed, if not completely so, although it is somewhat difficult to tell given the abnormal tissue and scarring in his foot. There is a fair amount of callus and crust accumulation. On the left, the wound looks about the same, again with callus and slough. He has an appointment next Thursday with Dr. Annamary Rummage in podiatry; I am hopeful that there may be some reconstructive options available for Mr. Porzio. 06/16/2022: Both wounds have some eschar and callus accumulation. The right sided wound is extremely narrow and barely open; the left is narrower than last week. There is a little bit of slough. He has his appointment in podiatry later today. 06/23/2022: The patient met with Dr. Annamary Rummage last week and unfortunately, there are no reconstructive options that he believes would be helpful. He did order an MRI to evaluate for osteomyelitis and fortunately, none was seen. The left sided wound is a little bit shorter and narrower today. The right sided wound is about the same. There is callus and eschar accumulation bilaterally. 06/29/2022: Both feet have improved from last week. There is epithelium making a valiant effort to creep  across the surface on the left. The right side looks like it got a little dry and the deep crevasse in his midfoot has cracked. Both have eschar and there is some slough on the left. 07/07/2022: Both feet have improved. There is epithelium completely covering the calcaneus on the right with just a small opening in the crevasse in his midfoot. On the left, the open area of tissue is smaller but he continues to build up callus/eschar and slough. 07/15/2022: The opening in the midfoot on the right is about the same size, covered with eschar and a little bit of slough. The open portion of the left wound is narrower and shorter with a bit of slough buildup. He admits to being on his feet more than recommended. 12/14; as far as I can tell everything on the right foot is closed. There is some eschar I removed some of this I cannot identify any open wound here. As usual this will be a very vulnerable  area going forward. On the left this looks really quite healthy. I was pleasantly surprised to see how good this looked. Wound is certainly smaller and there appears to be healthy epithelialization. He has been using Promogran on the right and endoform on the left. He has been offloading the right foot with a heel offloading boot and he has a running shoe on the right foot 08/04/2022: The right foot remains closed. He has a thick cushioned insole in his sneaker. The left sided wound is smaller with just some slough and eschar accumulation. He is wearing the heel off loader on this foot. 08/15/2022: The right foot remains closed. The left wound has narrowed further. There is some slough and eschar accumulation. 08/25/2022: We put him in a peg assist shoe insert and as a result, he has more epithelialization of the ulcer with minimal slough and eschar accumulation. 09/01/2022: The wound is smaller by about half this week. Still with some slough on the surface. The peg assist shoe seems to be doing a remarkable job  of adequately offloading the site. 09/08/2022: There is a little bit more epithelium coming in. There is some slough and callus buildup. 09/16/2022: The wound measures about the same size, but the epithelium that has grown in looks more robust and stronger. There is some slough and callus buildup. 09/23/2022: The wound remains about the same size. The skin edges are looking rather senescent. 09/29/2022: The aggressive debridement I performed last week seems to have been effective. The wound is smaller and has significantly less slough accumulation. 10/06/2022: There has been quite a bit of epithelialization since last week. There are still some open areas with slough accumulation. There is some callus buildup around the perimeter. 10/13/2022: Continued improvement. Minimal callus accumulation. 3/14; patient presents for follow-up. He has been using endoform to the wound bed without issues. He is using a surgical shoe with peg assist for offloading. 10/27/2022: The wound dimensions are stable. There is some senescent skin accumulation around the perimeter. 11/04/2022: Last week I performed a very aggressive debridement in an effort to stimulate the healing cascade. As has been the case when I have done this before, the wound has responded well. There has been epithelialization and contraction of the wound. There are just a couple of small open areas. 11/10/2022: Unfortunately, his foot got wet secondary to sweating and there has been some breakdown of the tissue, particularly the skin just adjacent to the JERMIE, HIPPE ([id number]) [phone].pdf Page 6 of 17 main ulcer. 11/18/2022: We continue to struggle with moisture-related tissue breakdown around the perimeter of his wound. This has caused the thin epithelium that had formed on the surface to disintegrate. The underlying surface of the wound has good granulation tissue. 11/25/2022: The edges of the wound are much less macerated,  but the surface is actually looking a little bit dry. There is slough accumulation. No obvious signs of infection. 12/01/2022: The wound edges are more macerated and broken down, making the wound larger. The surface has some slough on it. 12/08/2020: The wound looks better this week. There is significantly less maceration and moisture-related tissue breakdown. There is some slough on the wound surface. 12/15/2022: The wound continues to improve. There is almost no moisture-related tissue breakdown. There is epithelium beginning to fill in again from the edges. Light slough on the wound surface. 12/22/2022: More epithelium has filled him from around the edges. There is some slough on the surface. No evidence of moisture-related tissue breakdown. 01/05/2023: There  has been more epithelialization, particularly at the posterior calcaneal aspect of the wound. There has been a little bit of moisture accumulation with minimal maceration. 01/12/2023: More epithelium has filled in. There is very minimal accumulation of slough. 01/20/2023: The wound is measuring a little bit smaller today. It did measure deeper, but this appears to be secondary to some heaped up senescent skin around the edges. The epithelium that has been present at the posterior aspect of the wound seems to be a bit more robust with greater integrity. Electronic Signature(s) Signed: 01/20/2023 8:58:21 AM By: [name] MD FACS Entered By: [name] on 01/20/2023 08:58:21 -------------------------------------------------------------------------------- Physical Exam Details Patient Name: Date of Service: [name], [name] 01/20/2023 8:00 A M Medical Record Number: [id number] Patient Account Number: [id number] Date of Birth/Sex: Treating RN: 08/28/1975 (47 y.o. M) Primary Care Provider: Dorinda Hill Other Clinician: Referring Provider: Treating Provider/Extender: Jana Hakim in Treatment: 131 Constitutional .  . . . no acute distress. Respiratory Normal work of breathing on room air. Notes 01/20/2023: The wound is measuring a little bit smaller today. It did measure deeper, but this appears to be secondary to some heaped up senescent skin around the edges. The epithelium that has been present at the posterior aspect of the wound seems to be a bit more robust with greater integrity. Electronic Signature(s) Signed: 01/20/2023 8:58:58 AM By: [name] MD FACS Entered By: [name] on 01/20/2023 08:58:58 -------------------------------------------------------------------------------- Physician Orders Details Patient Name: Date of Service: [name], [name] 01/20/2023 8:00 A M Medical Record Number: [id number] Patient Account Number: [id number] Date of Birth/Sex: Treating RN: 1976/07/15 (47 y.o. [name] Primary Care Provider: Dorinda Hill Other Clinician: Referring Provider: Treating Provider/Extender: Jana Hakim in Treatment: 131 Verbal / Phone Orders: No MEKEL, HAVERSTOCK ([id number]) [id number] Page 7 of 17 Diagnosis Coding ICD-10 Coding Code Description [id number] Non-pressure chronic ulcer of other part of left foot with fat layer exposed Follow-up Appointments ppointment in 1 week. - Dr. Lady Gary Room 3 Return A Anesthetic Wound #2 Left Calcaneus (In clinic) Topical Lidocaine 4% applied to wound bed - In clinic Bathing/ Shower/ Hygiene May shower and wash wound with soap and water. Edema Control - Lymphedema / SCD / Other Bilateral Lower Extremities Avoid standing for long periods of time. Moisturize legs daily. - as needed Off-Loading Other: - keep pressure off of the bottom of your feet. Elevate legs throughout the day. Use the Shoe with the PegAssist off-loading insole Additional Orders / Instructions Follow Nutritious Diet - Try to get 70-100 grams of Protein a day+ Wound Treatment Wound #2 - Calcaneus Wound  Laterality: Left Cleanser: Normal Saline (Generic) 1 x Per Day/30 Days Discharge Instructions: Cleanse the wound with Normal Saline prior to applying a clean dressing using gauze sponges, not tissue or cotton balls. Cleanser: Wound Cleanser (Generic) 1 x Per Day/30 Days Discharge Instructions: Cleanse the wound with wound cleanser prior to applying a clean dressing using gauze sponges, not tissue or cotton balls. Peri-Wound Care: Zinc Oxide Ointment 30g tube 1 x Per Day/30 Days Discharge Instructions: Apply Zinc Oxide to periwound with each dressing change Topical: Gentamicin 1 x Per Day/30 Days Topical: compounding topical antibiotics from [facility] pharmacy 1 x Per Day/30 Days Discharge Instructions: HOLD Prim Dressing: Maxorb Extra Ag+ Alginate Dressing, 4x4.75 (in/in) 1 x Per Day/30 Days ary Discharge Instructions: Apply to wound bed as instructed Secondary Dressing: Drawtex 4x4 in 1 x Per Day/30 Days Discharge Instructions:  Apply over primary dressing as directed. Secondary Dressing: Optifoam Non-Adhesive Dressing, 4x4 in (Generic) 1 x Per Day/30 Days Discharge Instructions: Apply over primary dressing as directed. Secondary Dressing: Woven Gauze Sponge, Non-Sterile 4x4 in (Generic) 1 x Per Day/30 Days Discharge Instructions: Apply over primary dressing as directed. Secured With: 45M Medipore H Soft Cloth Surgical T ape, 4 x 10 (in/yd) (Generic) 1 x Per Day/30 Days Discharge Instructions: Secure with tape as directed. Compression Wrap: Kerlix Roll 4.5x3.1 (in/yd) 1 x Per Day/30 Days Discharge Instructions: Apply Kerlix and Coban compression as directed. Add-Ons: [employer] Offloading Boot, Size Regular 1 x Per Day/30 Days Electronic Signature(s) Signed: 01/20/2023 9:00:42 AM By: [name] MD FACS Entered By: [name] on 01/20/2023 08:59:11 [name] ([id number]) [id number] Page 8 of  17 -------------------------------------------------------------------------------- Problem List Details Patient Name: Date of Service: [name], TO [name] 01/20/2023 8:00 A M Medical Record Number: [id number] Patient Account Number: [id number] Date of Birth/Sex: Treating RN: 15-Aug-1975 (47 y.o. [name] Primary Care Provider: Dorinda Hill Other Clinician: Referring Provider: Treating Provider/Extender: Jana Hakim in Treatment: 131 Active Problems ICD-10 Encounter Code Description Active Date MDM Diagnosis [phone] Non-pressure chronic ulcer of other part of left foot with fat layer exposed 09/03/2020 No Yes Inactive Problems ICD-10 Code Description Active Date Inactive Date L97.512 Non-pressure chronic ulcer of other part of right foot with fat layer exposed 09/03/2020 09/03/2020 L89.893 Pressure ulcer of other site, stage 3 07/15/2020 07/15/2020 M62.81 Muscle weakness (generalized) 07/15/2020 07/15/2020 I10 Essential (primary) hypertension 07/15/2020 07/15/2020 M86.171 Other acute osteomyelitis, right ankle and foot 09/03/2020 09/03/2020 Resolved Problems Electronic Signature(s) Signed: 01/20/2023 8:57:07 AM By: [name] MD FACS Entered By: [name] on 01/20/2023 08:57:07 -------------------------------------------------------------------------------- Progress Note Details Patient Name: Date of Service: [name], [name] 01/20/2023 8:00 A M Medical Record Number: [id number] Patient Account Number: [id number] Date of Birth/Sex: Treating RN: [birth year] (47 y.o. M) Primary Care Provider: Dorinda Hill Other Clinician: Referring Provider: Treating Provider/Extender: Jana Hakim in Treatment: 131 Subjective Chief Complaint Information obtained from Patient Bilateral Plantar Foot Ulcers RYLEY, BACHTEL ([id number]) [id number] Page 9 of 17 History of Present Illness (HPI) Wounds are12/03/2020 upon  evaluation today patient presents for initial inspection here in our clinic concerning issues he has been having with the bottoms of his feet bilaterally. He states these actually occurred as wounds when he was hospitalized for 5 months secondary to Covid. He was apparently with tilting bed where he was in an upright position quite frequently and apparently this occurred in some way shape or form during that time. Fortunately there is no sign of active infection at this time. No fevers, chills, nausea, vomiting, or diarrhea. With that being said he still has substantial wounds on the plantar aspects of his feet Theragen require quite a bit of work to get these to heal. He has been using Santyl currently though that is been problematic both in receiving the medication as well as actually paid for it as it is become quite expensive. Prior to the experience with Covid the patient really did not have any major medical problems other than hypertension he does have some mild generalized weakness following the Covid experience. 07/22/2020 on evaluation today patient appears to be doing okay in regard to his foot ulcers I feel like the wound beds are showing signs of better improvement that I do believe the Iodoflex is helping in this regard. With that being said he does have a  lot of drainage currently and this is somewhat blue/green in nature which is consistent with Pseudomonas. I do think a culture today would be appropriate for [place] to evaluate and see if that is indeed the case I would likely start him on antibiotic orally as well he is not allergic to Cipro knows of no issues he has had in the past 12/21; patient was admitted to the clinic earlier this month with bilateral presumed pressure ulcers on the bottom of his feet apparently related to excessive pressure from a tilt table arrangement in the intensive care unit. Patient relates this to being on ECMO but I am not really sure that is exactly related  to that. I must say I have never seen anything like this. He has fairly extensive full-thickness wounds extending from his heel towards his midfoot mostly centered laterally. There is already been some healing distally. He does not appear to have an arterial issue. He has been using gentamicin to the wound surfaces with Iodoflex to help with ongoing debridement 1/6; this is a patient with pressure ulcers on the bottom of his feet related to excessive pressure from a standing position in the intensive care unit. He is complaining of a lot of pain in the right heel. He is not a diabetic. 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 We have been using Iodoflex to help prepare the surfaces of both wounds for an advanced treatment product. He is nonambulatory spending most of his time in a wheelchair I have asked him not to propel the wheelchair with his heels 1/13; in general his wounds look better not much surface area change we have been using Iodoflex as of last week. I did an x-ray of the right heel as the patient was complaining of pain. I had some thoughts about a stress fracture perhaps Achilles tendon problems however what it showed was erosive changes along the inferior aspect of the calcaneus he now has a MRI booked for 1/20. 1/20; in general his wounds continue to be better. Some improvement in the large narrow areas proximally in his foot. He is still complaining of pain in the right heel and tenderness in certain areas of this wound. His MRI is tonight. I am not just looking for osteomyelitis that was brought up on the x-ray I am wondering about stress fractures, tendon ruptures etc. He has no such findings on the left. Also noteworthy is that the patient had critical illness neuropathy and some of the discomfort may be actual improvement in nerve function I am just not sure. These wounds were initially in the setting of severe critical illness related to COVID-19. He was  put in a standing position. He may have also been on pressors at the point contributing to tissue ischemia. By his description at some point these wounds were grossly necrotic extending proximally up into the Achilles part of his heel. I do not know that I have ever really seen pictures of them like this although they may exist in epic We have ordered Tri layer Oasis. I am trying to stimulate some granulation in these areas. This is of course assuming the MRI is negative for infection 1/27; since the patient was last here he saw Dr. Earlene Plater of infectious disease. He is planned for vancomycin and ceftriaxone. Prior operative culture grew MSSA. Also ordered baseline lab work. He also ordered arterial studies although the ABIs in our clinic were normal as well as his clinical exam these were normal  I do not think he needs to see vascular surgery. His ABIs at the [facility] were 1.22 in the right triphasic waveforms with a normal TBI of 1.15 on the left ABI of 1.22 with triphasic waveforms and a normal TBI of 1.08. Finally he saw Dr. Logan Bores who will follow him in for 2 months. At this point I do not think he felt that he needed a procedure on the right calcaneal bone. Dr. Earlene Plater is elected for broad-spectrum antibiotic The patient is still having pain in the right heel. He walks with a walker 2/3; wounds are generally smaller. He is tolerating his IV antibiotics. I believe this is vancomycin and ceftriaxone. We are still waiting for Oasis burn in terms of his out-of-pocket max which he should be meeting soon given the IV antibiotics, MRIs etc. I have asked him to check in on this. We are using silver collagen in the meantime the wounds look better 2/10; tolerating IV vancomycin and Rocephin. We are waiting to apply for Oasis. Although I am not really sure where he is in his out-of-pocket max. 2/17 started the first application of Oasis trilayer. Still on antibiotics. The wounds have generally look better. The  area on the left has a little more surface slough requiring debridement 2/24; second application of Oasis trilayer. The wound surface granulation is generally look better. The area on the left with undermining laterally I think is come in a bit. 10/08/2020 upon evaluation today patient is here today for [employer] application #3. Fortunately he seems to be doing extremely well with regard to this and we are seeing a lot of new epithelial growth which is great news. Fortunately there is no signs of active infection at this time. 10/16/2020 upon evaluation today patient appears to be doing well with regard to his foot ulcers. Do believe the Oasis has been of benefit for him. I do not see any signs of infection right now which is great news and I think that he has a lot of new epithelial growth which is great to see as well. The patient is very pleased to hear all of this. I do think we can proceed with the Oasis trilayer #4 today. 3/18; not as much improvement in these areas on his heels that I was hoping. I did reapply trilateral Oasis today the tissue looks healthier but not as much fill in as I was hoping. 3/25; better looking today I think this is come in a bit the tissue looks healthier. Triple layer Oasis reapplied #6 4/1; somewhat better looking definitely better looking surface not as much change in surface area as I was hoping. He may be spending more time [place] on days then he needs to although he does have heel offloading boots. Triple layer Oasis reapplied #7 4/7; unfortunately apparently [facility] will not approve any further Oasis which is unfortunate since the patient did respond nicely both in terms of the condition of the wound bed as well as surface area. There is still some drainage coming from the wound but not a lot there does not appear to be any infection 4/15; we have been using Hydrofera Blue. He continues to have nice rims of epithelialization on the right  greater than the left. The left the epithelialization is coming from the tip of his heel. There is moderate drainage. In this that concerns me about a total contact cast. There is no evidence of infection 4/29; patient has been using Hydrofera Blue with dressing  changes. He has no complaints or issues today. 5/5; using Hydrofera Blue. I actually think that he looks marginally better than the last time I saw this 3 weeks ago. There are rims of epithelialization on the left thumb coming from the medial side on the right. Using Hydrofera Blue 5/12; using Hydrofera Blue. These continue to make improvements in surface area. His drainage was not listed as severe I therefore went ahead and put a cast on the left foot. Right foot we will continue to dress his previous 5/16; back for first total contact cast change. He did not tolerate this particularly well cast injury on the anterior tibia among other issues. Difficulty sleeping. I talked him about this in some detail and afterwards is elected to continue. I told him I would like to have a cast on for 3 weeks to see if this is going to help at all. I think he agreed 5/19; I think the wound is better. There is no tunneling towards his midfoot. The undermining medially also looks better. He has a rim of new skin distally. I think we are making progress here. The area on the left also continues to look somewhat better to me using Hydrofera Blue. He has a list of complaints about the cast but none of them seem serious 5/26; patient presents for 1 week follow-up. He has been using a total contact cast and tolerating this well. Hydrofera Blue is the main dressing used. He denies signs of infection. 6/2 Hydrofera Blue total contact cast on the left. These were large ulcers that formed in intensive care unit where the patient was recovering from COVID. May have had something to do with being ventilated in an upright positiono Pressors etc. We have been able to get  the areas down considerably and a viable ASTON, LIESKE ([id number]) [phone].pdf Page 10 of 17 surface. There is some epithelialization in both sides. Note made of drainage 6/9; changed to [facility] last time because of drainage. He arrives with better looking surfaces and dimensions on the left than the right. Paradoxically the right actually probes more towards his midfoot the left is largely close down but both of these look improved. Using a total contact cast on the left 6/16; complex wounds on his bilateral plantar heels which were initially pressure injury from a stay in the ICU with COVID. We have been using silver alginate most recently. His dimensions of come in quite dramatically however not recently. We have been putting the left foot in a total contact cast 6/23; complex wounds on the bilateral plantar heels. I been putting the left in the cast paradoxically the area on the right is the one that is going towards closure at a faster rate. Quite a bit of drainage on the left. The patient went to see Dr. Logan Bores who said he was going to standby for skin grafts. I had actually considered sending him for skin grafts however he would be mandatorily off his feet for a period of weeks to months. I am thinking that the area on the right is going to close on its own the area on the left has been more stubborn even though we have him in a total contact cast 6/30; took him out of a total contact cast last week is the right heel seem to be making better progress than the left where I was placing the cast. We are using silver alginate. Both wounds are smaller right greater than left 7/12; both wounds look  as though they are making some progress. We are using silver alginate. Heel offloading boots 7/26; very gradual progress especially on the right. Using silver alginate. He is wearing heel offloading boots 8/18; he continues to close these wounds down very gradually.  Using silver alginate. The problem polymen being definitive about this is areas of what appears to be callus around the margins. This is not a 100% of the area but certainly sizable especially on the right 9/1; bilateral plantar feet wounds secondary to prolonged pressure while being ventilated for COVID-19 in an upright position. Essentially pressure ulcers on the bottom of his feet. He is made substantial progress using silver alginate. 9/14; bilateral plantar feet wounds secondary to prolonged pressure. Making progress using silver alginate. 9/29 bilateral plantar feet wounds secondary to prolonged pressure. I changed him to Iodoflex last week. MolecuLight showing reddened blush fluorescence 10/11; patient presents for follow-up. He has no issues or complaints today. He denies signs of infection. He continues to use Iodoflex and antibiotic ointment to the wound beds. 10/27; 2-week follow-up. No evidence of infection. He has callus and thick dry skin around the wound margins we have been using Iodoflex and Bactroban which was in response to a moderate left MolecuLight reddish blush fluorescence. 11/10; 2-week follow-up. Wound margins again have thick callus however the measurements of the actual wound sites are a lot smaller. Everything looks reasonably healthy here. We have been using Iodoflex He was approved for prime matrix but I have elected to delay this given the improvement in the surface area. Hopefully I will not regret that decision as were getting close to the end of the year in terms of insurance payment 12/8; 2-week follow-up. Wounds are generally smaller in size. These were initially substantial wounds extending into the forefoot all the way into the heel on the bilateral plantar feet. They are now both located on the plantar heel distal aspect both of these have a lot of callus around the wounds I used a #5 curette to remove this on the right and the left also some subcutaneous  debris to try and get the wound edges were using Iodoflex. He has heel offloading shoe 12/22; 2-week follow-up. Not really much improvement. He has thick callus around the outer edges of both wounds. I remove this there is some nonviable subcutaneous tissue as well. We have been using Iodoflex. Her intake nurse and myself spontaneously thought of a total contact cast I went back in May. At that time we really were not seeing much of an improvement with a cast although the wound was in a much different situation I would like to retry this in 2 weeks and I discussed this with the patient 08/12/2021; the patient has had some improvement with the Iodoflex. The the area on the left heel plantar more improved than the right. I had to put him in a total contact cast on the left although I decided to put that off for 2 weeks. I am going to change his primary dressing to silver collagen. I think in both areas he has had some improvement most of the healing seems to be more proximal in the heel. The wounds are in the mid aspect. A lot of thick callus on the right heel however. 1/19; we are using silver collagen on both plantar heel areas. He has had some improvement today. The left did not require any debridement. He still had some eschar on the right that was debrided but both seem to have  contracted. I did not put it total contact cast on him today 2/2 we have been using silver collagen. The area on the right plantar heel has areas that appear to be epithelialized interspersed with dry flaking callus and dry skin. I removed this. This really looks better than on the other side. On the left still a large area with raised edges and debris on the surface. 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 He has no issues or complaints today. 2/9; not much change in the left foot wound with 1  week of a cast we are using silver collagen. Silver collagen on the right side. The right side has been the better wound surface. We will reapply the total contact cast on the left 2/16; not much improvement on either side I been using silver collagen with a total contact cast on the left. I'm changing the Hydrofera Blue still with a total contact cast on the left 2/23; some improvement on both sides. Disappointing that he has thick callus around the area that we are putting in a total contact cast on the left. We've been using Hydrofera Blue on both wound areas. This is a man who at essentially pressure ulcers in addition to ischemia caused by medications to support his blood pressure (pressors) in the ICU. He was being ventilated in the standing position for severe Covid. A Shiley the wounds extended across his entire foot but are now localized to his plantar heels bilaterally. We have made progress however neither areas healed. I continue to think the total contact cast is helped albeit painstakingly slowly. He has never wanted a plastic surgery consult although I don't know that they would be interested in grafting in area in this location. 10/07/2021: Continued improvement bilaterally. There is still some callus around the left wound, despite the total contact cast. He has some increased pain in his right midfoot around 1 particular area. This has been painful in the past but seems to be a little bit worse. When his cast was removed today, there was an area on the heel of the left foot that looks a bit macerated. He is also complaining of pain in his left thigh and hip which he thinks is secondary to the limb length discrepancy caused by the cast. 10/14/2021: He continues to improve. A little bit less callus around the left wound. He continues to endorse pain in his right midfoot, but this is not as significant as it was last week. The maceration on his left heel is improved. 10/21/2021: Continued  improvement to both wounds. The maceration on his left heel is no longer evident. Less callus bilaterally. Epithelialization progressing. 10/28/2021: Significant improvement this week. The right sided wound is nearly closed with just a small open area at the middle. No maceration seen on the left heel. Continued epithelialization on both sides. No concern for infection. 11/04/2021: T oday, the wounds were measured a little bit differently and come out as larger, but I actually think they are about the same to potentially even smaller, particularly on the left. He continues to accumulate some callus on the right. 11/11/2021: T oday, the patient is expressing some concern that the left wound, despite being in the total contact cast, is not progressing at the same rate as the 1 on the right. He is interested in trying a week without the  cast to see how the wound does. The wounds are roughly the same size as last week, with the right perhaps being a little bit smaller. He continues to build up callus on both sites. 11/18/2021: Last week, I permitted the patient to go without his total contact cast, just to see if the cast was really making any difference. Today, both wounds have deteriorated to some extent, suggesting that the cast is providing benefit, at least on the left. Both are larger and have accumulated callus, slough, and other debris. 11/26/2021: I debrided both wounds quite aggressively last week in an effort to stimulate the healing cascade. This appears to have been effective as the left sided wound is a full centimeter shorter in length. Although the right was measured slightly larger, on inspection, it looks as though an area of epithelialized tissue was included in the measurements. We have been using PolyMem Ag on the wound surfaces with a total contact cast to the left. DEMONIE, KASSA ([id number]) [id number] Page 11 of 17 12/02/2021: It appears that the intake  personnel are including epithelialized tissue in his wound measurements; the right wound is almost completely epithelialized; there is just a crater at the proximal midfoot with some open areas. On the left, he has built up some callus, but the overall wound surface looks good. There is some senescent skin around the wound margin. He has been in PolyMem Ag bilaterally with a total contact cast on the left. 12/09/2021: The right wound is nearly closed; there is just a small open area at the mid calcaneus. On the left, the wound is smaller with minimal callus buildup. No significant drainage. 12/16/2021: The right calcaneal wound remains minimally open at the mid calcaneus; the rest has epithelialized. On the left, the wound is also a little bit smaller. There is some senescent tissue on the periphery. He is getting his first application of a trial skin substitute called Vendaje today. 12/23/2021: The wound on his right calcaneus is nearly closed; there is just a small area at the most distal aspect of the calcaneus that is open. On the left, the area where we applied to the skin substitute has a healthier-looking bed of granulation tissue. The wound dimensions are not significantly different on this side but the wound surface is improved. 12/30/2021: The wound on the right calcaneus has not changed significantly aside from some accumulation of callus. On the left, the open area is smaller and continues to have an improved surface. He continues to accumulate callus around the wound. He is here for his third application of Vendaje. 01/06/2022: The right calcaneal wound is down to just a couple of millimeters. He continues to accumulate periwound callus. He unfortunately got his cast wet earlier in the week and his left foot is macerated, resulting in some superficial skin loss just distal to the open wound. The open wound itself, however, is much smaller and has a healthier appearing surface. He is here for his  fourth application of Vendaje. 01/13/2022: The right calcaneal wound is about the same. Unfortunately, once again, his cast got wet and his foot is again macerated. This is caused the left calcaneal wound to enlarge. He is here for his fifth application of Vendaje. 01/20/2022: The right calcaneal wound is very small. There is some periwound callus accumulation. He purchased a new cast protector last week and this has been effective in avoiding the maceration that has been occurring on the left. The left calcaneal wound is narrower and  has a healthy and viable-appearing surface. He is here for his 6 application of Vendaje. 01/27/2022: The right calcaneal wound is down to just a pinhole. There is some periwound slough and callus. On the left, the wound is narrower and shorter by about a centimeter. The surface is robust and viable-appearing. Unfortunately, the rep for the trial skin substitute product did not provide any for [place] to use today. 02/04/2022: The right calcaneal wound remains unchanged. There is more accumulated callus. On the left, although the intake nurse measured it a little bit longer, it looks about the same to me. The surface has a layer of slough, but underneath this, there is good granulation tissue. 02/10/2022: The right calcaneus wound is nearly closed. There is still some callus that builds up around the site. The left side looks about the same in terms of dimensions, but the surface is more robust and vital-appearing. 02/16/2022: The area of the right calcaneus that was nearly closed last week has closed, but there is a small opening at the mid foot where it looks like some moisture got retained and caused some reopening. The left foot wound is narrower and shallower. Both sites have a fair amount of periwound callus and eschar. 02/24/2022: The small midfoot opening on the right calcaneus is a little bit smaller today. The left foot wound is narrower and shallower. He continues  to accumulate periwound callus. No concern for infection. 03/01/2022: The patient came to clinic early because he showered and got his cast wet. Fortunately, there is no significant maceration to his foot but the callus softened and it looks like the wound on his left calcaneus may be a little bit wider. The wound on his right calcaneus is just a narrow slit. Continued accumulation of periwound callus bilaterally. 03/08/2022: The wound on his right calcaneus is very nearly closed, just a small pinpoint opening under a bit of eschar; the left wound has come in quite a bit, as well. It is narrower and shorter than at our last visit. Still with accumulated callus and eschar bilaterally. 03/17/2022: The right calcaneal wound is healed. The left wound is smaller and the surface itself is very clean, but there is some blue-green staining on the periwound callus, concerning for Pseudomonas aeruginosa. 03/23/2022: The right calcaneal wound remains closed. The left wound continues to contract. No further blue-green staining. Small amount of callus and slough accumulation. 03/28/2022: He came in early today because he had gotten his cast wet. On inspection, the wound itself did not get wet or macerated, just a little bit of the forefoot. The wound itself is basically unchanged. 04/07/2022: The right foot wound remains closed. The left wound is the smallest that I have seen it to date. It is narrower and shorter. It still continues to accumulate slough on the surface. 04/15/2022: There is a band of epithelium now dividing the small left plantar foot wound in 2. There is still some slough on the surface. 04/21/2022: The wound continues to narrow. Just a little bit of slough on the surface. He seems to be responding well to endoform. 04/28/2022: Continued slow contraction of the wound. There is a little slough on the surface and some periwound callus. We have been using endoform and total contact cast. 05/05/2022: The  wound appears to have stalled. There is slough and some periwound eschar/callus. No concern for infection, however. 05/12/2022: Unfortunately, his right foot has reopened. It is located at the most posterior aspect of his surgical incision. The area  was noted to have drainage coming from it when his padding was removed today. Underneath some callus and senescent skin, there is an opening. No purulent drainage or malodor. On the left foot, the wound is again unchanged. There is some light blue staining on the callus, but no malodor or purulent drainage. 10/13; right and left heel remanence of extensive plantar foot wounds. These are better than I remember by quite a big margin however he is still left with wounds on the left plantar heel and the right plantar heel. Been using endoform bilaterally. A culture was done that showed apparently Pseudomonas but we are still waiting for the [facility] antibiotic to use gentamicin today. He is still very active by description I am not sure about the offloading of his noncasted right foot 10/20; both wounds right and left heel debrided not much change from last week. [name] Echevaria has arrived which is linezolid, gentamicin and ciprofloxacin we will use this with endoform. T contact cast on the left otal 06/02/2022: Both wounds are smaller today. There is still a fair amount of callus buildup around the right foot ulcer. The left is more superficial and nearly flush with the surrounding tissues. Also with slough and eschar buildup. 06/10/2022: The right sided wound appears to be nearly closed, if not completely so, although it is somewhat difficult to tell given the abnormal tissue and scarring in his foot. There is a fair amount of callus and crust accumulation. On the left, the wound looks about the same, again with callus and slough. He has an appointment next Thursday with Dr. Annamary Rummage in podiatry; I am hopeful that there may be some reconstructive options available  for Mr. Drinkard. 06/16/2022: Both wounds have some eschar and callus accumulation. The right sided wound is extremely narrow and barely open; the left is narrower than last JACY, HOWAT ([id number]) [phone].pdf Page 12 of 17 week. There is a little bit of slough. He has his appointment in podiatry later today. 06/23/2022: The patient met with Dr. Annamary Rummage last week and unfortunately, there are no reconstructive options that he believes would be helpful. He did order an MRI to evaluate for osteomyelitis and fortunately, none was seen. The left sided wound is a little bit shorter and narrower today. The right sided wound is about the same. There is callus and eschar accumulation bilaterally. 06/29/2022: Both feet have improved from last week. There is epithelium making a valiant effort to creep across the surface on the left. The right side looks like it got a little dry and the deep crevasse in his midfoot has cracked. Both have eschar and there is some slough on the left. 07/07/2022: Both feet have improved. There is epithelium completely covering the calcaneus on the right with just a small opening in the crevasse in his midfoot. On the left, the open area of tissue is smaller but he continues to build up callus/eschar and slough. 07/15/2022: The opening in the midfoot on the right is about the same size, covered with eschar and a little bit of slough. The open portion of the left wound is narrower and shorter with a bit of slough buildup. He admits to being on his feet more than recommended. 12/14; as far as I can tell everything on the right foot is closed. There is some eschar I removed some of this I cannot identify any open wound here. As usual this will be a very vulnerable area going forward. On the left this looks really quite healthy.  I was pleasantly surprised to see how good this looked. Wound is certainly smaller and there appears to be  healthy epithelialization. He has been using Promogran on the right and endoform on the left. He has been offloading the right foot with a heel offloading boot and he has a running shoe on the right foot 08/04/2022: The right foot remains closed. He has a thick cushioned insole in his sneaker. The left sided wound is smaller with just some slough and eschar accumulation. He is wearing the heel off loader on this foot. 08/15/2022: The right foot remains closed. The left wound has narrowed further. There is some slough and eschar accumulation. 08/25/2022: We put him in a peg assist shoe insert and as a result, he has more epithelialization of the ulcer with minimal slough and eschar accumulation. 09/01/2022: The wound is smaller by about half this week. Still with some slough on the surface. The peg assist shoe seems to be doing a remarkable job of adequately offloading the site. 09/08/2022: There is a little bit more epithelium coming in. There is some slough and callus buildup. 09/16/2022: The wound measures about the same size, but the epithelium that has grown in looks more robust and stronger. There is some slough and callus buildup. 09/23/2022: The wound remains about the same size. The skin edges are looking rather senescent. 09/29/2022: The aggressive debridement I performed last week seems to have been effective. The wound is smaller and has significantly less slough accumulation. 10/06/2022: There has been quite a bit of epithelialization since last week. There are still some open areas with slough accumulation. There is some callus buildup around the perimeter. 10/13/2022: Continued improvement. Minimal callus accumulation. 3/14; patient presents for follow-up. He has been using endoform to the wound bed without issues. He is using a surgical shoe with peg assist for offloading. 10/27/2022: The wound dimensions are stable. There is some senescent skin accumulation around the perimeter. 11/04/2022: Last  week I performed a very aggressive debridement in an effort to stimulate the healing cascade. As has been the case when I have done this before, the wound has responded well. There has been epithelialization and contraction of the wound. There are just a couple of small open areas. 11/10/2022: Unfortunately, his foot got wet secondary to sweating and there has been some breakdown of the tissue, particularly the skin just adjacent to the main ulcer. 11/18/2022: We continue to struggle with moisture-related tissue breakdown around the perimeter of his wound. This has caused the thin epithelium that had formed on the surface to disintegrate. The underlying surface of the wound has good granulation tissue. 11/25/2022: The edges of the wound are much less macerated, but the surface is actually looking a little bit dry. There is slough accumulation. No obvious signs of infection. 12/01/2022: The wound edges are more macerated and broken down, making the wound larger. The surface has some slough on it. 12/08/2020: The wound looks better this week. There is significantly less maceration and moisture-related tissue breakdown. There is some slough on the wound surface. 12/15/2022: The wound continues to improve. There is almost no moisture-related tissue breakdown. There is epithelium beginning to fill in again from the edges. Light slough on the wound surface. 12/22/2022: More epithelium has filled him from around the edges. There is some slough on the surface. No evidence of moisture-related tissue breakdown. 01/05/2023: There has been more epithelialization, particularly at the posterior calcaneal aspect of the wound. There has been a little bit of  moisture accumulation with minimal maceration. 01/12/2023: More epithelium has filled in. There is very minimal accumulation of slough. 01/20/2023: The wound is measuring a little bit smaller today. It did measure deeper, but this appears to be secondary to some heaped up  senescent skin around the edges. The epithelium that has been present at the posterior aspect of the wound seems to be a bit more robust with greater integrity. Patient History Information obtained from Patient. Family History Cancer - Maternal Grandparents, Diabetes - Father,Paternal Grandparents, Heart Disease - Maternal Grandparents, Hypertension - Father,Paternal Grandparents, Lung Disease - Siblings, No family history of Hereditary Spherocytosis, Kidney Disease, Seizures, Stroke, Thyroid Problems, Tuberculosis. Social History Never smoker, Marital Status - Married, Alcohol Use - Never, Drug Use - No History, Caffeine Use - Daily - tea, soda. CHAUNCE, WINKELS ([id number]) [id number] Page 13 of 17 Medical History Eyes Denies history of Cataracts, Glaucoma, Optic Neuritis Ear/Nose/Mouth/Throat Denies history of Chronic sinus problems/congestion, Middle ear problems Hematologic/Lymphatic Denies history of Anemia, Hemophilia, Human Immunodeficiency Virus, Lymphedema, Sickle Cell Disease Respiratory Patient has history of Asthma Denies history of Aspiration, Chronic Obstructive Pulmonary Disease (COPD), Pneumothorax, Sleep Apnea, Tuberculosis Cardiovascular Patient has history of Angina - with COVID, Hypertension Denies history of Arrhythmia, Congestive Heart Failure, Coronary Artery Disease, Deep Vein Thrombosis, Hypotension, Myocardial Infarction, Peripheral Arterial Disease, Peripheral Venous Disease, Phlebitis, Vasculitis Gastrointestinal Denies history of Cirrhosis , Colitis, Crohns, Hepatitis A, Hepatitis B, Hepatitis C Endocrine Denies history of Type I Diabetes, Type II Diabetes Genitourinary Denies history of End Stage Renal Disease Immunological Denies history of Lupus Erythematosus, Raynauds, Scleroderma Integumentary (Skin) Denies history of History of Burn Musculoskeletal Denies history of Gout, Rheumatoid Arthritis, Osteoarthritis,  Osteomyelitis Neurologic Denies history of Dementia, Neuropathy, Quadriplegia, Paraplegia, Seizure Disorder Oncologic Denies history of Received Chemotherapy, Received Radiation Psychiatric Denies history of Anorexia/bulimia, Confinement Anxiety Hospitalization/Surgery History - COVID PNA 07/22/2019- 11/14/2019. - 03/27/2020 wound debridement/ skin graft. Medical A Surgical History Notes nd Constitutional Symptoms (General Health) COVID PNA 07/22/2019-11/14/2019 VENT ECMO, foot drop left foot , Genitourinary kidney stone Psychiatric anxiety Objective Constitutional no acute distress. Vitals Time Taken: 8:05 AM, Height: 69 in, Weight: 280 lbs, BMI: 41.3, Temperature: 98 F, Pulse: 79 bpm, Respiratory Rate: 18 breaths/min, Blood Pressure: 135/77 mmHg. Respiratory Normal work of breathing on room air. General Notes: 01/20/2023: The wound is measuring a little bit smaller today. It did measure deeper, but this appears to be secondary to some heaped up senescent skin around the edges. The epithelium that has been present at the posterior aspect of the wound seems to be a bit more robust with greater integrity. Integumentary (Hair, Skin) Wound #2 status is Open. Original cause of wound was Pressure Injury. The date acquired was: 10/07/2019. The wound has been in treatment 131 weeks. The wound is located on the Left Calcaneus. The wound measures 3.2cm length x 1cm width x 0.4cm depth; 2.513cm^2 area and 1.005cm^3 volume. There is Fat Layer (Subcutaneous Tissue) exposed. There is no tunneling or undermining noted. There is a medium amount of serosanguineous drainage noted. The wound margin is distinct with the outline attached to the wound base. There is medium (34-66%) red, pink granulation within the wound bed. There is a medium (34-66%) amount of necrotic tissue within the wound bed including Adherent Slough. The periwound skin appearance exhibited: Dry/Scaly, Hemosiderin Staining.  The periwound skin appearance did not exhibit: Callus, Crepitus, Excoriation, Induration, Rash, Scarring, Maceration, Atrophie Blanche, Cyanosis, Ecchymosis, Mottled, Pallor, Rubor, Erythema. Periwound temperature was noted as No Abnormality.  Assessment Active Problems ICD-10 Non-pressure chronic ulcer of other part of left foot with fat layer exposed REGGINALD, PASK ([id number]) [phone] Page 14 of 17 Procedures Wound #2 Pre-procedure diagnosis of Wound #2 is a Pressure Ulcer located on the Left Calcaneus . There was a Selective/Open Wound Non-Viable Tissue Debridement with a total area of 2.51 sq cm performed by [name], MD. With the following instrument(s): Curette to remove Viable and Non-Viable tissue/material. Material removed includes [facility] after achieving pain control using Lidocaine 4% Topical Solution. No specimens were taken. A time out was conducted at 08:27, prior to the start of the procedure. A Minimum amount of bleeding was controlled with Pressure. The procedure was tolerated well with a pain level of 0 throughout and a pain level of 0 following the procedure. Post Debridement Measurements: 3.2cm length x 1cm width x 0.4cm depth; 1.005cm^3 volume. Post debridement Stage noted as Category/Stage III. Character of Wound/Ulcer Post Debridement is improved. Post procedure Diagnosis Wound #2: Same as Pre-Procedure General Notes: Scribed for Dr Lady Gary by Brenton Grills RN.Marland Kitchen Plan Follow-up Appointments: Return Appointment in 1 week. - Dr. Lady Gary Room 3 Anesthetic: Wound #2 Left Calcaneus: (In clinic) Topical Lidocaine 4% applied to wound bed - In clinic Bathing/ Shower/ Hygiene: May shower and wash wound with soap and water. Edema Control - Lymphedema / SCD / Other: Avoid standing for long periods of time. Moisturize legs daily. - as needed Off-Loading: Other: - keep pressure off of the bottom of your feet. Elevate legs throughout the  day. Use the Shoe with the PegAssist off-loading insole Additional Orders / Instructions: Follow Nutritious Diet - Try to get 70-100 grams of Protein a day+ WOUND #2: - Calcaneus Wound Laterality: Left Cleanser: Normal Saline (Generic) 1 x Per Day/30 Days Discharge Instructions: Cleanse the wound with Normal Saline prior to applying a clean dressing using gauze sponges, not tissue or cotton balls. Cleanser: Wound Cleanser (Generic) 1 x Per Day/30 Days Discharge Instructions: Cleanse the wound with wound cleanser prior to applying a clean dressing using gauze sponges, not tissue or cotton balls. Peri-Wound Care: Zinc Oxide Ointment 30g tube 1 x Per Day/30 Days Discharge Instructions: Apply Zinc Oxide to periwound with each dressing change Topical: Gentamicin 1 x Per Day/30 Days Topical: compounding topical antibiotics from [facility] pharmacy 1 x Per Day/30 Days Discharge Instructions: HOLD Prim Dressing: Maxorb Extra Ag+ Alginate Dressing, 4x4.75 (in/in) 1 x Per Day/30 Days ary Discharge Instructions: Apply to wound bed as instructed Secondary Dressing: Drawtex 4x4 in 1 x Per Day/30 Days Discharge Instructions: Apply over primary dressing as directed. Secondary Dressing: Optifoam Non-Adhesive Dressing, 4x4 in (Generic) 1 x Per Day/30 Days Discharge Instructions: Apply over primary dressing as directed. Secondary Dressing: Woven Gauze Sponge, Non-Sterile 4x4 in (Generic) 1 x Per Day/30 Days Discharge Instructions: Apply over primary dressing as directed. Secured With: 42M Medipore H Soft Cloth Surgical T ape, 4 x 10 (in/yd) (Generic) 1 x Per Day/30 Days Discharge Instructions: Secure with tape as directed. Com pression Wrap: Kerlix Roll 4.5x3.1 (in/yd) 1 x Per Day/30 Days Discharge Instructions: Apply Kerlix and Coban compression as directed. Add-Ons: [employer] Offloading Boot, Size Regular 1 x Per Day/30 Days 01/20/2023: The wound is measuring a little bit smaller today. It did measure  deeper, but this appears to be secondary to some heaped up senescent skin around the edges. The epithelium that has been present at the posterior aspect of the wound seems to be a bit more robust with greater integrity. I  used a curette to debride slough and senescent skin from the wound. We will continue topical gentamicin with silver alginate, drawtex, Optifoam donut for offloading, along with a peg assist insert. Follow-up in 1 week. Electronic Signature(s) Signed: 01/20/2023 8:59:58 AM By: [name] MD FACS Entered By: [name] on 01/20/2023 08:59:58 [name] ([id number]) [id number] Page 15 of 17 -------------------------------------------------------------------------------- HxROS Details Patient Name: Date of Service: [name], TO [name] 01/20/2023 8:00 A M Medical Record Number: [id number] Patient Account Number: [id number] Date of Birth/Sex: Treating RN: March 08, 1976 (47 y.o. M) Primary Care Provider: Dorinda Hill Other Clinician: Referring Provider: Treating Provider/Extender: Jana Hakim in Treatment: 131 Information Obtained From Patient Constitutional Symptoms (General Health) Medical History: Past Medical History Notes: COVID PNA 07/22/2019-11/14/2019 VENT ECMO, foot drop left foot , Eyes Medical History: Negative for: Cataracts; Glaucoma; Optic Neuritis Ear/Nose/Mouth/Throat Medical History: Negative for: Chronic sinus problems/congestion; Middle ear problems Hematologic/Lymphatic Medical History: Negative for: Anemia; Hemophilia; Human Immunodeficiency Virus; Lymphedema; Sickle Cell Disease Respiratory Medical History: Positive for: Asthma Negative for: Aspiration; Chronic Obstructive Pulmonary Disease (COPD); Pneumothorax; Sleep Apnea; Tuberculosis Cardiovascular Medical History: Positive for: Angina - with COVID; Hypertension Negative for: Arrhythmia; Congestive Heart Failure; Coronary Artery  Disease; Deep Vein Thrombosis; Hypotension; Myocardial Infarction; Peripheral Arterial Disease; Peripheral Venous Disease; Phlebitis; Vasculitis Gastrointestinal Medical History: Negative for: Cirrhosis ; Colitis; Crohns; Hepatitis A; Hepatitis B; Hepatitis C Endocrine Medical History: Negative for: Type I Diabetes; Type II Diabetes Genitourinary Medical History: Negative for: End Stage Renal Disease Past Medical History Notes: kidney stone Immunological Medical History: Negative for: Lupus Erythematosus; Raynauds; Scleroderma Integumentary (Skin) Medical History: Negative for: History of Burn Musculoskeletal Medical History: Negative for: Gout; Rheumatoid Arthritis; Osteoarthritis; Osteomyelitis JOBANNY, MAVIS ([id number]) [id number] Page 16 of 17 Neurologic Medical History: Negative for: Dementia; Neuropathy; Quadriplegia; Paraplegia; Seizure Disorder Oncologic Medical History: Negative for: Received Chemotherapy; Received Radiation Psychiatric Medical History: Negative for: Anorexia/bulimia; Confinement Anxiety Past Medical History Notes: anxiety Immunizations Pneumococcal Vaccine: Received Pneumococcal Vaccination: No Implantable Devices None Hospitalization / Surgery History Type of Hospitalization/Surgery COVID PNA 07/22/2019- 11/14/2019 03/27/2020 wound debridement/ skin graft Family and Social History Cancer: Yes - Maternal Grandparents; Diabetes: Yes - Father,Paternal Grandparents; Heart Disease: Yes - Maternal Grandparents; Hereditary Spherocytosis: No; Hypertension: Yes - Father,Paternal Grandparents; Kidney Disease: No; Lung Disease: Yes - Siblings; Seizures: No; Stroke: No; Thyroid Problems: No; Tuberculosis: No; Never smoker; Marital Status - Married; Alcohol Use: Never; Drug Use: No History; Caffeine Use: Daily - tea, soda; Financial Concerns: No; Food, Clothing or Shelter Needs: No; Support System Lacking: No; Transportation  Concerns: No Electronic Signature(s) Signed: 01/20/2023 9:00:42 AM By: [name] MD FACS Entered By: [name] on 01/20/2023 08:58:37 -------------------------------------------------------------------------------- SuperBill Details Patient Name: Date of Service: [name], [name] 01/20/2023 Medical Record Number: [id number] Patient Account Number: [id number] Date of Birth/Sex: Treating RN: [birth year] (47 y.o. [name] Primary Care Provider: Dorinda Hill Other Clinician: Referring Provider: Treating Provider/Extender: Jana Hakim in Treatment: 131 Diagnosis Coding ICD-10 Codes Code Description 7062121014 Non-pressure chronic ulcer of other part of left foot with fat layer exposed Facility Procedures : CPT4 Code: 41324401 Description: [phone] - DEBRIDE WOUND 1ST 20 SQ CM OR < ICD-10 Diagnosis Description L97.522 Non-pressure chronic ulcer of other part of left foot with fat layer exposed Modifier: Quantity: 1 Physician Procedures : CPT4 Code Description Modifier [id number] - WC PHYS LEVEL 4 - EST PT 25 JDYN, PARKERSON ([id number]) [id number] ICD-10 Diagnosis Description L97.522 Non-pressure chronic ulcer  of other part of left foot with fat layer exposed Quantity: 1 27.pdf Page 17 of 17 : [id number] - WC PHYS DEBR WO ANESTH 20 SQ CM 1 ICD-10 Diagnosis Description L97.522 Non-pressure chronic ulcer of other part of left foot with fat layer exposed Quantity: Electronic Signature(s) Signed: 01/20/2023 9:00:10 AM By: [name] MD FACS Entered By: [name] on 01/20/2023 09:00:10

## 2023-02-22 NOTE — Progress Notes (Signed)
FREDERIK, STANDLEY ([id number]) [phone].pdf Page 1 of 8 Visit Report for 02/16/2023 Arrival Information Details Patient Name: Date of Service: [name], [name] [name] 02/16/2023 9:30 A M Medical Record Number: [id number] Patient Account Number: [id number] Date of Birth/Sex: Treating RN: 03/26/1976 (48 y.o. [name] Primary Care Edison Nicholson: Dorinda Hill Other Clinician: Referring Liem Copenhaver: Treating Therese Rocco/Extender: Jana Hakim in Treatment: 135 Visit Information History Since Last Visit Added or deleted any medications: No Patient Arrived: Wheel Chair Any new allergies or adverse reactions: No Arrival Time: 09:33 Had a fall or experienced change in No Accompanied By: family activities of daily living that may affect Transfer Assistance: None risk of falls: Patient Identification Verified: Yes Signs or symptoms of abuse/neglect since last visito No Secondary Verification Process Completed: Yes Hospitalized since last visit: No Patient Requires Transmission-Based Precautions: No Implantable device outside of the clinic excluding No Patient Has Alerts: No cellular tissue based products placed in the center since last visit: Has Dressing in Place as Prescribed: Yes Has Footwear/Offloading in Place as Prescribed: Yes Left: Other:globoped Pain Present Now: No Electronic Signature(s) Signed: 02/16/2023 3:51:17 PM By: Zenaida Deed RN, BSN Entered By: Zenaida Deed on 02/16/2023 09:34:23 -------------------------------------------------------------------------------- Encounter Discharge Information Details Patient Name: Date of Service: [name], [name] [place] E. 02/16/2023 9:30 A M Medical Record Number: [id number] Patient Account Number: [id number] Date of Birth/Sex: Treating RN: 05-12-1976 (47 y.o. [name] Primary Care Julliana Whitmyer: Dorinda Hill Other Clinician: Referring Venida Tsukamoto: Treating Maverik Foot/Extender: Jana Hakim in Treatment: 135 Encounter Discharge Information Items Post Procedure Vitals Discharge Condition: Stable Temperature (F): 98.1 Ambulatory Status: Wheelchair Pulse (bpm): 84 Discharge Destination: Home Respiratory Rate (breaths/min): 18 Transportation: Private Auto Blood Pressure (mmHg): 138/74 Accompanied By: family Schedule Follow-up Appointment: Yes Clinical Summary of Care: Patient Declined Electronic Signature(s) Signed: 02/22/2023 7:59:22 AM By: Brenton Grills Entered By: Brenton Grills on 02/16/2023 10:18:08 [name] ([id number]) [id number] Page 2 of 8 -------------------------------------------------------------------------------- Lower Extremity Assessment Details Patient Name: Date of Service: [name] [name] 02/16/2023 9:30 A M Medical Record Number: [id number] Patient Account Number: [id number] Date of Birth/Sex: Treating RN: 1976/08/04 (47 y.o. [name] Primary Care Alva Broxson: Dorinda Hill Other Clinician: Referring Kairon Shock: Treating Jewett Mcgann/Extender: Jana Hakim in Treatment: 135 Edema Assessment Assessed: [name]: No] Franne Forts: No] Edema: [Left: N] [Right: o] Calf Left: Right: Point of Measurement: 29 cm From Medial Instep 42.8 cm Ankle Left: Right: Point of Measurement: 9 cm From Medial Instep 24.7 cm Vascular Assessment Pulses: Dorsalis Pedis Palpable: [Left:Yes] Extremity colors, hair growth, and conditions: Extremity Color: [Left:Normal] Hair Growth on Extremity: [Left:Yes] Temperature of Extremity: [Left:Warm < 3 seconds] Electronic Signature(s) Signed: 02/16/2023 3:51:17 PM By: Zenaida Deed RN, BSN Entered By: Zenaida Deed on 02/16/2023 09:38:36 -------------------------------------------------------------------------------- Multi Wound Chart Details Patient Name: Date of Service: [name], [name] [place] E. 02/16/2023 9:30 A M Medical Record Number: [id number] Patient  Account Number: [id number] Date of Birth/Sex: Treating RN: February 18, 1976 (47 y.o. M) Primary Care Khye Hochstetler: Dorinda Hill Other Clinician: Referring Zalma Channing: Treating Hayle Parisi/Extender: Jana Hakim in Treatment: 135 Vital Signs Height(in): 69 Pulse(bpm): 75 Weight(lbs): 280 Blood Pressure(mmHg): 142/77 Body Mass Index(BMI): 41.3 Temperature(F): 98 Respiratory Rate(breaths/min): 18 [2:Photos:] [N/A:N/A] Left Calcaneus N/A N/A Wound Location: Pressure Injury N/A N/A Wounding Event: Pressure Ulcer N/A N/A Primary Etiology: Asthma, Angina, Hypertension N/A N/A Comorbid History: 10/07/2019 N/A N/A Date Acquired: 135 N/A N/A Weeks of Treatment: Open N/A N/A Wound Status: No N/A N/A Wound  Recurrence: 3.8x1.8x0.1 N/A N/A Measurements L x W x D (cm) 5.372 N/A N/A A (cm) : rea 0.537 N/A N/A Volume (cm) : 80.20% N/A N/A % Reduction in A rea: 98.00% N/A N/A % Reduction in Volume: Category/Stage III N/A N/A Classification: Medium N/A N/A Exudate A mount: Serosanguineous N/A N/A Exudate Type: red, brown N/A N/A Exudate Color: Thickened N/A N/A Wound Margin: Large (67-100%) N/A N/A Granulation A mount: Pink N/A N/A Granulation Quality: Small (1-33%) N/A N/A Necrotic A mount: Fat Layer (Subcutaneous Tissue): Yes N/A N/A Exposed Structures: Fascia: No Tendon: No Muscle: No Joint: No Bone: No Small (1-33%) N/A N/A Epithelialization: Debridement - Selective/Open Wound N/A N/A Debridement: Pre-procedure Verification/Time Out 10:00 N/A N/A Taken: Lidocaine 4% T opical Solution N/A N/A Pain Control: Callus, Slough N/A N/A Tissue Debrided: Non-Viable Tissue N/A N/A Level: 5.37 N/A N/A Debridement A (sq cm): rea Curette N/A N/A Instrument: Minimum N/A N/A Bleeding: Pressure N/A N/A Hemostasis A chieved: 0 N/A N/A Procedural Pain: 0 N/A N/A Post Procedural Pain: Procedure was tolerated well N/A N/A Debridement Treatment  Response: 3.8x1.8x0.1 N/A N/A Post Debridement Measurements L x W x D (cm) 0.537 N/A N/A Post Debridement Volume: (cm) Category/Stage III N/A N/A Post Debridement Stage: Scarring: Yes N/A N/A Periwound Skin Texture: Excoriation: No Induration: No Callus: No Crepitus: No Rash: No Dry/Scaly: Yes N/A N/A Periwound Skin Moisture: Maceration: No Atrophie Blanche: No N/A N/A Periwound Skin Color: Cyanosis: No Ecchymosis: No Erythema: No Hemosiderin Staining: No Mottled: No Pallor: No Rubor: No No Abnormality N/A N/A Temperature: Yes N/A N/A Tenderness on Palpation: Debridement N/A N/A Procedures Performed: Treatment Notes Electronic Signature(s) Signed: 02/16/2023 10:06:10 AM By: [name] MD FACS Entered By: [name] on 02/16/2023 10:06:10 [name] ([id number]) [id number] Page 4 of 8 -------------------------------------------------------------------------------- Multi-Disciplinary Care Plan Details Patient Name: Date of Service: [name], [name] [name] 02/16/2023 9:30 A M Medical Record Number: [id number] Patient Account Number: [id number] Date of Birth/Sex: Treating RN: 1975-12-31 (47 y.o. [name] Primary Care Natacia Chaisson: Dorinda Hill Other Clinician: Referring Yurianna Tusing: Treating Colleen Kotlarz/Extender: Jana Hakim in Treatment: 135 Multidisciplinary Care Plan reviewed with physician Active Inactive Wound/Skin Impairment Nursing Diagnoses: Impaired tissue integrity Knowledge deficit related [name] ulceration/compromised skin integrity Goals: Patient/caregiver will verbalize understanding of skin care regimen Date Initiated: 07/15/2020 Target Resolution Date: 05/08/2023 Goal Status: Active Ulcer/skin breakdown will have a volume reduction of 30% by week 4 Date Initiated: 07/15/2020 Date Inactivated: 08/20/2020 Target Resolution Date: 09/03/2020 Goal Status: Unmet Unmet Reason: no major  changes. Ulcer/skin breakdown will heal within 14 weeks Date Initiated: 12/04/2020 Date Inactivated: 12/10/2020 Target Resolution Date: 12/10/2020 Unmet Reason: wounds still open at 14 Goal Status: Unmet weeks and today 21 weeks. Interventions: Assess patient/caregiver ability [name] obtain necessary supplies Assess patient/caregiver ability [name] perform ulcer/skin care regimen upon admission and as needed Assess ulceration(s) every visit Provide education on ulcer and skin care Treatment Activities: Skin care regimen initiated : 07/15/2020 Topical wound management initiated : 07/15/2020 Notes: Electronic Signature(s) Signed: 02/16/2023 3:51:17 PM By: Zenaida Deed RN, BSN Entered By: Zenaida Deed on 02/16/2023 09:41:55 -------------------------------------------------------------------------------- Pain Assessment Details Patient Name: Date of Service: [name], [name] [place] E. 02/16/2023 9:30 A M Medical Record Number: [id number] Patient Account Number: [id number] Date of Birth/Sex: Treating RN: 19-Oct-1975 (47 y.o. [name] Primary Care [place] Plake: Dorinda Hill Other Clinician: Referring Mareena Cavan: Treating Orlandria Kissner/Extender: Jana Hakim in Treatment: 135 Active Problems Location of Pain Severity and Description of Pain Patient Has  Paino No Site Locations Rate the pain. [name], [name] ([id number]) [phone].pdf Page 5 of 8 Rate the pain. Current Pain Level: 0 Character of Pain Describe the Pain: Tender Pain Management and Medication Current Pain Management: Electronic Signature(s) Signed: 02/16/2023 3:51:17 PM By: Zenaida Deed RN, BSN Entered By: Zenaida Deed on 02/16/2023 09:35:19 -------------------------------------------------------------------------------- Patient/Caregiver Education Details Patient Name: Date of Service: [name], [name] [place] 7/11/2024andnbsp9:30 A M Medical Record Number: [id number] Patient Account  Number: [id number] Date of Birth/Gender: Treating RN: [birth year] (47 y.o. [name] Primary Care Physician: Dorinda Hill Other Clinician: Referring Physician: Treating Physician/Extender: Jana Hakim in Treatment: 135 Education Assessment Education Provided [name]: Patient Education Topics Provided Offloading: Methods: Explain/Verbal Responses: Reinforcements needed, State content correctly Wound/Skin Impairment: Methods: Explain/Verbal Responses: Reinforcements needed, State content correctly Electronic Signature(s) Signed: 02/16/2023 3:51:17 PM By: Zenaida Deed RN, BSN Entered By: Zenaida Deed on 02/16/2023 09:42:21 [name] ([id number]) [id number] Page 6 of 8 -------------------------------------------------------------------------------- Wound Assessment Details Patient Name: Date of Service: [name], [name] [name] 02/16/2023 9:30 A M Medical Record Number: [id number] Patient Account Number: [id number] Date of Birth/Sex: Treating RN: October 29, 1975 (47 y.o. [name] Primary Care Garen Woolbright: Dorinda Hill Other Clinician: Referring Dhani Dannemiller: Treating Amyra Vantuyl/Extender: Jana Hakim in Treatment: 135 Wound Status Wound Number: 2 Primary Etiology: Pressure Ulcer Wound Location: Left Calcaneus Wound Status: Open Wounding Event: Pressure Injury Comorbid History: Asthma, Angina, Hypertension Date Acquired: 10/07/2019 Weeks Of Treatment: 135 Clustered Wound: No Photos Wound Measurements Length: (cm) 3 Width: (cm) 1 Depth: (cm) 0 Area: (cm) Volume: (cm) .8 % Reduction in Area: 80.2% .8 % Reduction in Volume: 98% .1 Epithelialization: Small (1-33%) 5.372 Tunneling: No 0.537 Undermining: No Wound Description Classification: Category/Stage III Wound Margin: Thickened Exudate Amount: Medium Exudate Type: Serosanguineous Exudate Color: red, brown Foul Odor After Cleansing:  No Slough/Fibrino No Wound Bed Granulation Amount: Large (67-100%) Exposed Structure Granulation Quality: Pink Fascia Exposed: No Necrotic Amount: Small (1-33%) Fat Layer (Subcutaneous Tissue) Exposed: Yes Necrotic Quality: Adherent Slough Tendon Exposed: No Muscle Exposed: No Joint Exposed: No Bone Exposed: No Periwound Skin Texture Texture Color No Abnormalities Noted: No No Abnormalities Noted: No Callus: No Atrophie Blanche: No Crepitus: No Cyanosis: No Excoriation: No Ecchymosis: No Induration: No Erythema: No Rash: No Hemosiderin Staining: No Scarring: Yes Mottled: No Pallor: No Moisture Rubor: No No Abnormalities Noted: Yes Temperature / Pain Temperature: No Abnormality Tenderness on Palpation: Yes Treatment Notes Wound #2 (Calcaneus) Wound Laterality: Left DVAUGHN, [name] ([id number]) [id number] Page 7 of 8 Cleanser Normal Saline Discharge Instruction: Cleanse the wound with Normal Saline prior [name] applying a clean dressing using gauze sponges, not tissue or cotton balls. Wound Cleanser Discharge Instruction: Cleanse the wound with wound cleanser prior [name] applying a clean dressing using gauze sponges, not tissue or cotton balls. Peri-Wound Care Ketoconazole Cream 2% Discharge Instruction: Apply Ketoconazole as directed Zinc Oxide Ointment 30g tube Discharge Instruction: Apply Zinc Oxide [name] periwound with each dressing change Topical Gentamicin Ketoconazole Cream 2% Discharge Instruction: Apply Ketoconazole as directed compounding topical antibiotics from [facility] pharmacy Discharge Instruction: HOLD Primary Dressing Maxorb Extra Ag+ Alginate Dressing, 4x4.75 (in/in) Discharge Instruction: Apply [name] wound bed as instructed Secondary Dressing Drawtex 4x4 in Discharge Instruction: Apply over primary dressing as directed. Optifoam Non-Adhesive Dressing, 4x4 in Discharge Instruction: Apply over primary dressing as  directed. Woven Gauze Sponge, Non-Sterile 4x4 in Discharge Instruction: Apply over primary dressing as directed. Secured With 15M Medipore [employer] Cloth Surgical T ape,  4 x 10 (in/yd) Discharge Instruction: Secure with tape as directed. Compression Wrap Kerlix Roll 4.5x3.1 (in/yd) Discharge Instruction: Apply Kerlix and Coban compression as directed. Compression Stockings Add-Ons [place] [facility] Offloading Boot, Size Regular Electronic Signature(s) Signed: 02/16/2023 3:51:17 PM By: Zenaida Deed RN, BSN Entered By: Zenaida Deed on 02/16/2023 09:44:39 -------------------------------------------------------------------------------- Vitals Details Patient Name: Date of Service: [name], [name] [place] E. 02/16/2023 9:30 A M Medical Record Number: [id number] Patient Account Number: [id number] Date of Birth/Sex: Treating RN: [birth year] (47 y.o. [name] Primary Care Karter Hellmer: Dorinda Hill Other Clinician: Referring Kire Ferg: Treating Khalid Lacko/Extender: Jana Hakim in Treatment: 135 Vital Signs Time Taken: 09:34 Temperature (F): 98 Height (in): 69 Pulse (bpm): 75 Weight (lbs): 280 Respiratory Rate (breaths/min): 18 Body Mass Index (BMI): 41.3 Blood Pressure (mmHg): 142/77 Reference Range: 80 - 120 mg / dl [name], [name] ([id number]) [id number] Page 8 of 8 Electronic Signature(s) Signed: 02/16/2023 3:51:17 PM By: Zenaida Deed RN, BSN Entered By: Zenaida Deed on 02/16/2023 09:35:04

## 2023-02-24 ENCOUNTER — Encounter (HOSPITAL_BASED_OUTPATIENT_CLINIC_OR_DEPARTMENT_OTHER): Payer: PPO | Admitting: General Surgery

## 2023-02-24 DIAGNOSIS — L89622 Pressure ulcer of left heel, stage 2: Secondary | ICD-10-CM | POA: Diagnosis not present

## 2023-02-24 DIAGNOSIS — L97522 Non-pressure chronic ulcer of other part of left foot with fat layer exposed: Secondary | ICD-10-CM | POA: Diagnosis not present

## 2023-02-26 ENCOUNTER — Other Ambulatory Visit (INDEPENDENT_AMBULATORY_CARE_PROVIDER_SITE_OTHER): Payer: Self-pay | Admitting: Family Medicine

## 2023-02-26 DIAGNOSIS — E559 Vitamin D deficiency, unspecified: Secondary | ICD-10-CM

## 2023-02-27 NOTE — Progress Notes (Addendum)
[name] ([id number]) [id number] Page 1 of 17 Visit Report for 02/24/2023 Chief Complaint Document Details Patient Name: Date of Service: [name], [name] [name] 02/24/2023 9:30 A M Medical Record Number: [id number] Patient Account Number: [id number] Date of Birth/Sex: Treating RN: Sep 23, 1975 (47 y.o. M) Primary Care Provider: Dorinda [name] Other Clinician: Referring Provider: Treating Provider/Extender: [name] in Treatment: 136 Information Obtained from: Patient Chief Complaint Bilateral Plantar Foot Ulcers Electronic Signature(s) Signed: 03/07/2023 8:10:08 AM By: [name] Entered By: [name] on 03/07/2023 05:10:08 -------------------------------------------------------------------------------- Debridement Details Patient Name: Date of Service: [name], [name] [place] E. 02/24/2023 9:30 A M Medical Record Number: [id number] Patient Account Number: [id number] Date of Birth/Sex: Treating RN: 1976/02/19 (47 y.o. M) Primary Care Provider: Dorinda [name] Other Clinician: Referring Provider: Treating Provider/Extender: [name] in Treatment: 136 Debridement Performed for Assessment: Wound #2 Left Calcaneus Performed By: Physician [name] Guess, MD Debridement Type: Debridement Level of Consciousness (Pre-procedure): Awake and Alert Pre-procedure Verification/Time Out Yes - 09:50 Taken: Percent of Wound Bed Debrided: 100% T Area Debrided (cm): otal 4.14 Tissue and other material debrided: Non-Viable, Callus, Slough, Slough Level: Non-Viable Tissue Debridement Description: Selective/Open Wound Instrument: Curette Bleeding: Minimum Hemostasis Achieved: Pressure Response [name] Treatment: Procedure was tolerated well Level of Consciousness (Post- Awake and Alert procedure): Post Debridement Measurements of Total Wound Length: (cm) 3.3 Stage: Category/Stage III Width: (cm) 1.6 Depth: (cm)  0.2 Volume: (cm) 0.829 Character of Wound/Ulcer Post Debridement: Improved Post Procedure Diagnosis Same as Pre-procedure Notes [name], [name] ([id number]) [id number] Page 2 of 17 scribed for Dr. Lady [name] by [name] Deed, RN Electronic Signature(s) Signed: 03/07/2023 8:10:01 AM By: [name] Previous Signature: 02/27/2023 6:34:47 PM Version By: [name] Deed RN, BSN Previous Signature: 02/28/2023 7:56:46 AM Version By: [name] Entered By: [name] on 03/07/2023 05:10:01 -------------------------------------------------------------------------------- HPI Details Patient Name: Date of Service: [name], [name] [place] E. 02/24/2023 9:30 A M Medical Record Number: [id number] Patient Account Number: [id number] Date of Birth/Sex: Treating RN: 04/19/1976 (47 y.o. M) Primary Care Provider: Dorinda [name] Other Clinician: Referring Provider: Treating Provider/Extender: [name] in Treatment: 136 History of Present Illness HPI Description: Wounds are12/03/2020 upon evaluation today patient presents for initial inspection here in our clinic concerning issues he has been having with the bottoms of his feet bilaterally. He states these actually occurred as wounds when he was hospitalized for 5 months secondary [name] Covid. He was apparently with tilting bed where he was in an upright position quite frequently and apparently this occurred in some way shape or form during that time. Fortunately there is no sign of active infection at this time. No fevers, chills, nausea, vomiting, or diarrhea. With that being said he still has substantial wounds on the plantar aspects of his feet Theragen require quite a bit of work [name] get these [name] heal. He has been using Santyl currently though that is been problematic both in receiving the medication as well as actually paid for it as it is become quite expensive. Prior [name] the experience  with Covid the patient really did not have any major medical problems other than hypertension he does have some mild generalized weakness following the Covid experience. 07/22/2020 on evaluation today patient appears [name] be doing okay in regard [name] his foot ulcers I feel like the wound beds are showing signs of better improvement that I do believe the Iodoflex is helping in this regard. With that being  said he does have a lot of drainage currently and this is somewhat blue/green in nature which is consistent with Pseudomonas. I do think a culture today would be appropriate for [place] [name] evaluate and see if that is indeed the case I would likely start him on antibiotic orally as well he is not allergic [name] Cipro knows of no issues he has had in the past 12/21; patient was admitted [name] the clinic earlier this month with bilateral presumed pressure ulcers on the bottom of his feet apparently related [name] excessive pressure from a tilt table arrangement in the intensive care unit. Patient relates this [name] being on ECMO but I am not really sure that is exactly related [name] that. I must say I have never seen anything like this. He has fairly extensive full-thickness wounds extending from his heel towards his midfoot mostly centered laterally. There is already been some healing distally. He does not appear [name] have an arterial issue. He has been using gentamicin [name] the wound surfaces with Iodoflex [name] help with ongoing debridement 1/6; this is a patient with pressure ulcers on the bottom of his feet related [name] excessive pressure from a standing position in the intensive care unit. He is complaining of a lot of pain in the right heel. He is not a diabetic. 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 We have been using Iodoflex [name] help prepare the surfaces of both wounds for an advanced treatment product. He is nonambulatory spending most of his time in a wheelchair I have asked him not [name] propel the  wheelchair with his heels 1/13; in general his wounds look better not much surface area change we have been using Iodoflex as of last week. I did an x-ray of the right heel as the patient was complaining of pain. I had some thoughts about a stress fracture perhaps Achilles tendon problems however what it showed was erosive changes along the inferior aspect of the calcaneus he now has a MRI booked for 1/20. 1/20; in general his wounds continue [name] be better. Some improvement in the large narrow areas proximally in his foot. He is still complaining of pain in the right heel and tenderness in certain areas of this wound. His MRI is tonight. I am not just looking for osteomyelitis that was brought up on the x-ray I am wondering about stress fractures, tendon ruptures etc. He has no such findings on the left. Also noteworthy is that the patient had critical illness neuropathy and some of the discomfort may be actual improvement in nerve function I am just not sure. These wounds were initially in the setting of severe critical illness related [name] COVID-19. He was put in a standing position. He may have also been on pressors at the point contributing [name] tissue ischemia. By his description at some point these wounds were grossly necrotic extending proximally up into the Achilles part of his heel. I do not know that I have ever really seen pictures of them like this although they may exist in epic We have ordered Tri layer Oasis. I am trying [name] stimulate some granulation in these areas. This is of course assuming the MRI is negative for infection 1/27; since the patient was last here he saw Dr. Earlene Plater of infectious disease. He is planned for vancomycin and ceftriaxone. Prior operative culture grew MSSA. Also ordered baseline lab work. He also ordered arterial studies although the ABIs in our clinic were normal as well as his clinical  exam these were normal I do not think he needs [name] see vascular surgery. His  ABIs at the [facility] were 1.22 in the right triphasic waveforms with a normal TBI of 1.15 on the left ABI of 1.22 with triphasic waveforms and a normal TBI of 1.08. Finally he saw Dr. Logan Bores who will follow him in for 2 months. At this point I do not think he felt that he needed a procedure on the right calcaneal bone. Dr. Earlene Plater is elected for broad-spectrum antibiotic The patient is still having pain in the right heel. He walks with a walker 2/3; wounds are generally smaller. He is tolerating his IV antibiotics. I believe this is vancomycin and ceftriaxone. We are still waiting for Oasis burn in terms of his out-of-pocket max which he should be meeting soon given the IV antibiotics, MRIs etc. I have asked him [name] check in on this. We are using silver collagen in the meantime the wounds look better 2/10; tolerating IV vancomycin and Rocephin. We are waiting [name] apply for Oasis. Although I am not really sure where he is in his out-of-pocket max. 2/17 started the first application of Oasis trilayer. Still on antibiotics. The wounds have generally look better. The area on the left has a little more surface slough requiring debridement 2/24; second application of Oasis trilayer. The wound surface granulation is generally look better. The area on the left with undermining laterally I think is come in a bit. JERIAH, CORKUM ([id number]) [id number] Page 3 of 17 10/08/2020 upon evaluation today patient is here today for [employer] application #3. Fortunately he seems [name] be doing extremely well with regard [name] this and we are seeing a lot of new epithelial growth which is great news. Fortunately there is no signs of active infection at this time. 10/16/2020 upon evaluation today patient appears [name] be doing well with regard [name] his foot ulcers. Do believe the Oasis has been of benefit for him. I do not see any signs of infection right now which is great news and I think that he has a lot  of new epithelial growth which is great [name] see as well. The patient is very pleased [name] hear all of this. I do think we can proceed with the Oasis trilayer #4 today. 3/18; not as much improvement in these areas on his heels that I was hoping. I did reapply trilateral Oasis today the tissue looks healthier but not as much fill in as I was hoping. 3/25; better looking today I think this is come in a bit the tissue looks healthier. Triple layer Oasis reapplied #6 4/1; somewhat better looking definitely better looking surface not as much change in surface area as I was hoping. He may be spending more time [place] on days then he needs [name] although he does have heel offloading boots. Triple layer Oasis reapplied #7 4/7; unfortunately apparently [facility] will not approve any further Oasis which is unfortunate since the patient did respond nicely both in terms of the condition of the wound bed as well as surface area. There is still some drainage coming from the wound but not a lot there does not appear [name] be any infection 4/15; we have been using Hydrofera Blue. He continues [name] have nice rims of epithelialization on the right greater than the left. The left the epithelialization is coming from the tip of his heel. There is moderate drainage. In this that concerns me about a total contact cast. There is  no evidence of infection 4/29; patient has been using Hydrofera Blue with dressing changes. He has no complaints or issues today. 5/5; using Hydrofera Blue. I actually think that he looks marginally better than the last time I saw this 3 weeks ago. There are rims of epithelialization on the left thumb coming from the medial side on the right. Using Hydrofera Blue 5/12; using Hydrofera Blue. These continue [name] make improvements in surface area. His drainage was not listed as severe I therefore went ahead and put a cast on the left foot. Right foot we will continue [name] dress his previous 5/16; back  for first total contact cast change. He did not tolerate this particularly well cast injury on the anterior tibia among other issues. Difficulty sleeping. I talked him about this in some detail and afterwards is elected [name] continue. I told him I would like [name] have a cast on for 3 weeks [name] see if this is going [name] help at all. I think he agreed 5/19; I think the wound is better. There is no tunneling towards his midfoot. The undermining medially also looks better. He has a rim of new skin distally. I think we are making progress here. The area on the left also continues [name] look somewhat better [name] me using Hydrofera Blue. He has a list of complaints about the cast but none of them seem serious 5/26; patient presents for 1 week follow-up. He has been using a total contact cast and tolerating this well. Hydrofera Blue is the main dressing used. He denies signs of infection. 6/2 Hydrofera Blue total contact cast on the left. These were large ulcers that formed in intensive care unit where the patient was recovering from COVID. May have had something to do with being ventilated in an upright positiono Pressors etc. We have been able [name] get the areas down considerably and a viable surface. There is some epithelialization in both sides. Note made of drainage 6/9; changed [name] [facility] last time because of drainage. He arrives with better looking surfaces and dimensions on the left than the right. Paradoxically the right actually probes more towards his midfoot the left is largely close down but both of these look improved. Using a total contact cast on the left 6/16; complex wounds on his bilateral plantar heels which were initially pressure injury from a stay in the ICU with COVID. We have been using silver alginate most recently. His dimensions of come in quite dramatically however not recently. We have been putting the left foot in a total contact cast 6/23; complex wounds on the bilateral plantar  heels. I been putting the left in the cast paradoxically the area on the right is the one that is going towards closure at a faster rate. Quite a bit of drainage on the left. The patient went [name] see Dr. Logan Bores who said he was going [name] standby for skin grafts. I had actually considered sending him for skin grafts however he would be mandatorily off his feet for a period of weeks [name] months. I am thinking that the area on the right is going [name] close on its own the area on the left has been more stubborn even though we have him in a total contact cast 6/30; took him out of a total contact cast last week is the right heel seem [name] be making better progress than the left where I was placing the cast. We are using silver alginate. Both wounds are smaller right greater than  left 7/12; both wounds look as though they are making some progress. We are using silver alginate. Heel offloading boots 7/26; very gradual progress especially on the right. Using silver alginate. He is wearing heel offloading boots 8/18; he continues [name] close these wounds down very gradually. Using silver alginate. The problem polymen being definitive about this is areas of what appears [name] be callus around the margins. This is not a 100% of the area but certainly sizable especially on the right 9/1; bilateral plantar feet wounds secondary [name] prolonged pressure while being ventilated for COVID-19 in an upright position. Essentially pressure ulcers on the bottom of his feet. He is made substantial progress using silver alginate. 9/14; bilateral plantar feet wounds secondary [name] prolonged pressure. Making progress using silver alginate. 9/29 bilateral plantar feet wounds secondary [name] prolonged pressure. I changed him [name] Iodoflex last week. MolecuLight showing reddened blush fluorescence 10/11; patient presents for follow-up. He has no issues or complaints today. He denies signs of infection. He continues [name] use Iodoflex and antibiotic  ointment [name] the wound beds. 10/27; 2-week follow-up. No evidence of infection. He has callus and thick dry skin around the wound margins we have been using Iodoflex and Bactroban which was in response [name] a moderate left MolecuLight reddish blush fluorescence. 11/10; 2-week follow-up. Wound margins again have thick callus however the measurements of the actual wound sites are a lot smaller. Everything looks reasonably healthy here. We have been using Iodoflex He was approved for prime matrix but I have elected [name] delay this given the improvement in the surface area. Hopefully I will not regret that decision as were getting close [name] the end of the year in terms of insurance payment 12/8; 2-week follow-up. Wounds are generally smaller in size. These were initially substantial wounds extending into the forefoot all the way into the heel on the bilateral plantar feet. They are now both located on the plantar heel distal aspect both of these have a lot of callus around the wounds I used a #5 curette [name] remove this on the right and the left also some subcutaneous debris [name] try and get the wound edges were using Iodoflex. He has heel offloading shoe 12/22; 2-week follow-up. Not really much improvement. He has thick callus around the outer edges of both wounds. I remove this there is some nonviable subcutaneous tissue as well. We have been using Iodoflex. Her intake nurse and myself spontaneously thought of a total contact cast I went back in May. At that time we really were not seeing much of an improvement with a cast although the wound was in a much different situation I would like [name] retry this in 2 weeks and I discussed this with the patient 08/12/2021; the patient has had some improvement with the Iodoflex. The the area on the left heel plantar more improved than the right. I had [name] put him in a total contact cast on the left although I decided [name] put that off for 2 weeks. I am going [name] change his primary  dressing [name] silver collagen. I think in both areas he has had some improvement most of the healing seems [name] be more proximal in the heel. The wounds are in the mid aspect. A lot of thick callus on the right heel however. 1/19; we are using silver collagen on both plantar heel areas. He has had some improvement today. The left did not require any debridement. He still had some eschar on the right that was debrided  but both seem [name] have contracted. I did not put it total contact cast on him today 2/2 we have been using silver collagen. The area on the right plantar heel has areas that appear [name] be epithelialized interspersed with dry flaking callus and dry skin. I removed this. This really looks better than on the other side. On the left still a large area with raised edges and debris on the surface. 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 He has no issues or complaints today. 2/9; not much change in the left foot wound with 1 week of a cast we are using silver collagen. Silver collagen on the right side. The right side has been the better wound surface. We will reapply the total contact cast on the left 2/16; not much improvement on either side I been using silver collagen with a total contact cast on the left. I'm changing the Hydrofera Blue still with a total contact cast on the left 2/23; some improvement on both sides. Disappointing that he has thick callus around the area that we are putting in a total contact cast on the left. We've been using Hydrofera Blue on both wound areas. This is a man who at essentially pressure ulcers in addition [name] ischemia caused by medications [name] support his blood pressure (pressors) in the ICU. He was ADELARD, SANON ([id number]) [phone].pdf Page 4 of 17 being ventilated in the standing position for severe  Covid. A Shiley the wounds extended across his entire foot but are now localized [name] his plantar heels bilaterally. We have made progress however neither areas healed. I continue [name] think the total contact cast is helped albeit painstakingly slowly. He has never wanted a plastic surgery consult although I don't know that they would be interested in grafting in area in this location. 10/07/2021: Continued improvement bilaterally. There is still some callus around the left wound, despite the total contact cast. He has some increased pain in his right midfoot around 1 particular area. This has been painful in the past but seems [name] be a little bit worse. When his cast was removed today, there was an area on the heel of the left foot that looks a bit macerated. He is also complaining of pain in his left thigh and hip which he thinks is secondary [name] the limb length discrepancy caused by the cast. 10/14/2021: He continues [name] improve. A little bit less callus around the left wound. He continues [name] endorse pain in his right midfoot, but this is not as significant as it was last week. The maceration on his left heel is improved. 10/21/2021: Continued improvement [name] both wounds. The maceration on his left heel is no longer evident. Less callus bilaterally. Epithelialization progressing. 10/28/2021: Significant improvement this week. The right sided wound is nearly closed with just a small open area at the middle. No maceration seen on the left heel. Continued epithelialization on both sides. No concern for infection. 11/04/2021: T oday, the wounds were measured a little bit differently and come out as larger, but I actually think they are about the same [name] potentially even smaller, particularly on the left. He continues [name] accumulate some callus on the right. 11/11/2021: T oday, the patient is expressing some concern that the left wound, despite being in the total contact cast, is not progressing at the same rate as the  1  on the right. He is interested in trying a week without the cast [name] see how the wound does. The wounds are roughly the same size as last week, with the right perhaps being a little bit smaller. He continues [name] build up callus on both sites. 11/18/2021: Last week, I permitted the patient [name] go without his total contact cast, just [name] see if the cast was really making any difference. Today, both wounds have deteriorated [name] some extent, suggesting that the cast is providing benefit, at least on the left. Both are larger and have accumulated callus, slough, and other debris. 11/26/2021: I debrided both wounds quite aggressively last week in an effort [name] stimulate the healing cascade. This appears [name] have been effective as the left sided wound is a full centimeter shorter in length. Although the right was measured slightly larger, on inspection, it looks as though an area of epithelialized tissue was included in the measurements. We have been using PolyMem Ag on the wound surfaces with a total contact cast [name] the left. 12/02/2021: It appears that the intake personnel are including epithelialized tissue in his wound measurements; the right wound is almost completely epithelialized; there is just a crater at the proximal midfoot with some open areas. On the left, he has built up some callus, but the overall wound surface looks good. There is some senescent skin around the wound margin. He has been in PolyMem Ag bilaterally with a total contact cast on the left. 12/09/2021: The right wound is nearly closed; there is just a small open area at the mid calcaneus. On the left, the wound is smaller with minimal callus buildup. No significant drainage. 12/16/2021: The right calcaneal wound remains minimally open at the mid calcaneus; the rest has epithelialized. On the left, the wound is also a little bit smaller. There is some senescent tissue on the periphery. He is getting his first application of a trial skin  substitute called Vendaje today. 12/23/2021: The wound on his right calcaneus is nearly closed; there is just a small area at the most distal aspect of the calcaneus that is open. On the left, the area where we applied [name] the skin substitute has a healthier-looking bed of granulation tissue. The wound dimensions are not significantly different on this side but the wound surface is improved. 12/30/2021: The wound on the right calcaneus has not changed significantly aside from some accumulation of callus. On the left, the open area is smaller and continues [name] have an improved surface. He continues [name] accumulate callus around the wound. He is here for his third application of Vendaje. 01/06/2022: The right calcaneal wound is down [name] just a couple of millimeters. He continues [name] accumulate periwound callus. He unfortunately got his cast wet earlier in the week and his left foot is macerated, resulting in some superficial skin loss just distal [name] the open wound. The open wound itself, however, is much smaller and has a healthier appearing surface. He is here for his fourth application of Vendaje. 01/13/2022: The right calcaneal wound is about the same. Unfortunately, once again, his cast got wet and his foot is again macerated. This is caused the left calcaneal wound [name] enlarge. He is here for his fifth application of Vendaje. 01/20/2022: The right calcaneal wound is very small. There is some periwound callus accumulation. He purchased a new cast protector last week and this has been effective in avoiding the maceration that has been occurring on the left. The left calcaneal  wound is narrower and has a healthy and viable-appearing surface. He is here for his 6 application of Vendaje. 01/27/2022: The right calcaneal wound is down [name] just a pinhole. There is some periwound slough and callus. On the left, the wound is narrower and shorter by about a centimeter. The surface is robust and viable-appearing.  Unfortunately, the rep for the trial skin substitute product did not provide any for [place] [name] use today. 02/04/2022: The right calcaneal wound remains unchanged. There is more accumulated callus. On the left, although the intake nurse measured it a little bit longer, it looks about the same [name] me. The surface has a layer of slough, but underneath this, there is good granulation tissue. 02/10/2022: The right calcaneus wound is nearly closed. There is still some callus that builds up around the site. The left side looks about the same in terms of dimensions, but the surface is more robust and vital-appearing. 02/16/2022: The area of the right calcaneus that was nearly closed last week has closed, but there is a small opening at the mid foot where it looks like some moisture got retained and caused some reopening. The left foot wound is narrower and shallower. Both sites have a fair amount of periwound callus and eschar. 02/24/2022: The small midfoot opening on the right calcaneus is a little bit smaller today. The left foot wound is narrower and shallower. He continues [name] accumulate periwound callus. No concern for infection. 03/01/2022: The patient came [name] clinic early because he showered and got his cast wet. Fortunately, there is no significant maceration [name] his foot but the callus softened and it looks like the wound on his left calcaneus may be a little bit wider. The wound on his right calcaneus is just a narrow slit. Continued accumulation of periwound callus bilaterally. 03/08/2022: The wound on his right calcaneus is very nearly closed, just a small pinpoint opening under a bit of eschar; the left wound has come in quite a bit, as well. It is narrower and shorter than at our last visit. Still with accumulated callus and eschar bilaterally. 03/17/2022: The right calcaneal wound is healed. The left wound is smaller and the surface itself is very clean, but there is some blue-green staining on the periwound  callus, concerning for Pseudomonas aeruginosa. 03/23/2022: The right calcaneal wound remains closed. The left wound continues [name] contract. No further blue-green staining. Small amount of callus and slough accumulation. 03/28/2022: He came in early today because he had gotten his cast wet. On inspection, the wound itself did not get wet or macerated, just a little bit of the forefoot. The wound itself is basically unchanged. DEON, DUER ([id number]) [id number] Page 5 of 17 04/07/2022: The right foot wound remains closed. The left wound is the smallest that I have seen it [name] date. It is narrower and shorter. It still continues [name] accumulate slough on the surface. 04/15/2022: There is a band of epithelium now dividing the small left plantar foot wound in 2. There is still some slough on the surface. 04/21/2022: The wound continues [name] narrow. Just a little bit of slough on the surface. He seems [name] be responding well [name] endoform. 04/28/2022: Continued slow contraction of the wound. There is a little slough on the surface and some periwound callus. We have been using endoform and total contact cast. 05/05/2022: The wound appears [name] have stalled. There is slough and some periwound eschar/callus. No concern for infection, however. 05/12/2022: Unfortunately, his right foot has reopened. It  is located at the most posterior aspect of his surgical incision. The area was noted [name] have drainage coming from it when his padding was removed today. Underneath some callus and senescent skin, there is an opening. No purulent drainage or malodor. On the left foot, the wound is again unchanged. There is some light blue staining on the callus, but no malodor or purulent drainage. 10/13; right and left heel remanence of extensive plantar foot wounds. These are better than I remember by quite a big margin however he is still left with wounds on the left plantar heel and the right plantar heel. Been  using endoform bilaterally. A culture was done that showed apparently Pseudomonas but we are still waiting for the [facility] antibiotic [name] use gentamicin today. He is still very active by description I am not sure about the offloading of his noncasted right foot 10/20; both wounds right and left heel debrided not much change from last week. [name] Echevaria has arrived which is linezolid, gentamicin and ciprofloxacin we will use this with endoform. T contact cast on the left otal 06/02/2022: Both wounds are smaller today. There is still a fair amount of callus buildup around the right foot ulcer. The left is more superficial and nearly flush with the surrounding tissues. Also with slough and eschar buildup. 06/10/2022: The right sided wound appears [name] be nearly closed, if not completely so, although it is somewhat difficult [name] tell given the abnormal tissue and scarring in his foot. There is a fair amount of callus and crust accumulation. On the left, the wound looks about the same, again with callus and slough. He has an appointment next Thursday with Dr. Annamary Rummage in podiatry; I am hopeful that there may be some reconstructive options available for Mr. Lamarque. 06/16/2022: Both wounds have some eschar and callus accumulation. The right sided wound is extremely narrow and barely open; the left is narrower than last week. There is a little bit of slough. He has his appointment in podiatry later today. 06/23/2022: The patient met with Dr. Annamary Rummage last week and unfortunately, there are no reconstructive options that he believes would be helpful. He did order an MRI [name] evaluate for osteomyelitis and fortunately, none was seen. The left sided wound is a little bit shorter and narrower today. The right sided wound is about the same. There is callus and eschar accumulation bilaterally. 06/29/2022: Both feet have improved from last week. There is epithelium making a valiant effort [name] creep across the surface on  the left. The right side looks like it got a little dry and the deep crevasse in his midfoot has cracked. Both have eschar and there is some slough on the left. 07/07/2022: Both feet have improved. There is epithelium completely covering the calcaneus on the right with just a small opening in the crevasse in his midfoot. On the left, the open area of tissue is smaller but he continues [name] build up callus/eschar and slough. 07/15/2022: The opening in the midfoot on the right is about the same size, covered with eschar and a little bit of slough. The open portion of the left wound is narrower and shorter with a bit of slough buildup. He admits [name] being on his feet more than recommended. 12/14; as far as I can tell everything on the right foot is closed. There is some eschar I removed some of this I cannot identify any open wound here. As usual this will be a very vulnerable area going forward. On the left  this looks really quite healthy. I was pleasantly surprised [name] see how good this looked. Wound is certainly smaller and there appears [name] be healthy epithelialization. He has been using Promogran on the right and endoform on the left. He has been offloading the right foot with a heel offloading boot and he has a running shoe on the right foot 08/04/2022: The right foot remains closed. He has a thick cushioned insole in his sneaker. The left sided wound is smaller with just some slough and eschar accumulation. He is wearing the heel off loader on this foot. 08/15/2022: The right foot remains closed. The left wound has narrowed further. There is some slough and eschar accumulation. 08/25/2022: We put him in a peg assist shoe insert and as a result, he has more epithelialization of the ulcer with minimal slough and eschar accumulation. 09/01/2022: The wound is smaller by about half this week. Still with some slough on the surface. The peg assist shoe seems [name] be doing a remarkable job of adequately offloading  the site. 09/08/2022: There is a little bit more epithelium coming in. There is some slough and callus buildup. 09/16/2022: The wound measures about the same size, but the epithelium that has grown in looks more robust and stronger. There is some slough and callus buildup. 09/23/2022: The wound remains about the same size. The skin edges are looking rather senescent. 09/29/2022: The aggressive debridement I performed last week seems [name] have been effective. The wound is smaller and has significantly less slough accumulation. 10/06/2022: There has been quite a bit of epithelialization since last week. There are still some open areas with slough accumulation. There is some callus buildup around the perimeter. 10/13/2022: Continued improvement. Minimal callus accumulation. 3/14; patient presents for follow-up. He has been using endoform [name] the wound bed without issues. He is using a surgical shoe with peg assist for offloading. 10/27/2022: The wound dimensions are stable. There is some senescent skin accumulation around the perimeter. 11/04/2022: Last week I performed a very aggressive debridement in an effort [name] stimulate the healing cascade. As has been the case when I have done this before, the wound has responded well. There has been epithelialization and contraction of the wound. There are just a couple of small open areas. 11/10/2022: Unfortunately, his foot got wet secondary [name] sweating and there has been some breakdown of the tissue, particularly the skin just adjacent [name] the main ulcer. 11/18/2022: We continue [name] struggle with moisture-related tissue breakdown around the perimeter of his wound. This has caused the thin epithelium that had formed on the surface [name] disintegrate. The underlying surface of the wound has good granulation tissue. 11/25/2022: The edges of the wound are much less macerated, but the surface is actually looking a little bit dry. There is slough accumulation. No obvious signs CAIDEN, ARTEAGA ([id number]) [phone].pdf Page 6 of 17 of infection. 12/01/2022: The wound edges are more macerated and broken down, making the wound larger. The surface has some slough on it. 12/08/2020: The wound looks better this week. There is significantly less maceration and moisture-related tissue breakdown. There is some slough on the wound surface. 12/15/2022: The wound continues [name] improve. There is almost no moisture-related tissue breakdown. There is epithelium beginning [name] fill in again from the edges. Light slough on the wound surface. 12/22/2022: More epithelium has filled him from around the edges. There is some slough on the surface. No evidence of moisture-related tissue breakdown. 01/05/2023: There has been more epithelialization, particularly at  the posterior calcaneal aspect of the wound. There has been a little bit of moisture accumulation with minimal maceration. 01/12/2023: More epithelium has filled in. There is very minimal accumulation of slough. 01/20/2023: The wound is measuring a little bit smaller today. It did measure deeper, but this appears [name] be secondary [name] some heaped up senescent skin around the edges. The epithelium that has been present at the posterior aspect of the wound seems [name] be a bit more robust with greater integrity. 01/26/2023: He reports intense itching [name] the skin around the wound and there has been some breakdown here. It looks fungal in nature. The wound itself looks pretty good with improved epithelialization with just some slough buildup. He has a little callus along the wound edges. 02/02/2023: He has been treating the periwound skin with an over-the-counter athlete's foot medication and notes significant improvement in his pruritus. The skin looks better here, as well. The wound continues [name] epithelialize. There is light slough accumulation. 02/10/2023: The periwound skin still looks fairly angry. He reports multiple small blisters that  have opened up. He is not having the intense itching that he had prior, however. There has been more epithelialization at the calcaneus and there is minimal slough accumulation. 02/16/2023: The periwound skin is improving. An online review of dermatology rash images suggest that this may be hand-foot-and-mouth disease, isolated [name] the feet; the patient says he actually had this as a child. The calcaneus continues [name] epithelialize and the tissue was quite robust here. Very minimal slough accumulation on the open portion of the wound with a little bit of periwound callus buildup. 02/24/23: Itching resolved, no issues. Electronic Signature(s) Signed: 03/07/2023 8:10:18 AM By: [name] Previous Signature: 03/03/2023 4:29:54 PM Version By: Thayer Dallas Previous Signature: 03/06/2023 10:26:58 AM Version By: [name] Entered By: [name] on 03/07/2023 05:10:18 -------------------------------------------------------------------------------- Physical Exam Details Patient Name: Date of Service: [name], [name] [place] E. 02/24/2023 9:30 A M Medical Record Number: [id number] Patient Account Number: [id number] Date of Birth/Sex: Treating RN: [birth year] (47 y.o. M) Primary Care Provider: Dorinda [name] Other Clinician: Referring Provider: Treating Provider/Extender: [name] in Treatment: 136 Constitutional . . . . no acute distress. Respiratory Normal work of breathing on room air. Notes 02/24/23: Minimal moisture-breakdown at deepest part of wound. Light slough with periwound callus. Electronic Signature(s) Signed: 03/07/2023 8:10:47 AM By: [name] Previous Signature: 03/03/2023 4:29:54 PM Version By: Thayer Dallas Previous Signature: 03/06/2023 10:26:58 AM Version By: [name] Entered By: [name] on 03/07/2023 05:10:47 [name] ([id number]) [id number] Page 7 of  17 -------------------------------------------------------------------------------- Physician Orders Details Patient Name: Date of Service: [name], [name] [name] 02/24/2023 9:30 A M Medical Record Number: [id number] Patient Account Number: [id number] Date of Birth/Sex: Treating RN: [birth year] (47 y.o. [name] Primary Care Provider: Dorinda [name] Other Clinician: Referring Provider: Treating Provider/Extender: [name] in Treatment: [phone] Verbal / Phone Orders: No Diagnosis Coding ICD-10 Coding Code Description [phone] Non-pressure chronic ulcer of other part of left foot with fat layer exposed Follow-up Appointments ppointment in 1 week. - Dr. Lady [name] Room 3 Return A Anesthetic Wound #2 Left Calcaneus (In clinic) Topical Lidocaine 4% applied [name] wound bed - In clinic Bathing/ Shower/ Hygiene May shower and wash wound with soap and water. Edema Control - Lymphedema / SCD / Other Bilateral Lower Extremities Avoid standing for long periods of time. Moisturize legs daily. - as needed Off-Loading  Other: - keep pressure off of the bottom of your feet. Elevate legs throughout the day. Use the Shoe with the PegAssist off-loading insole Additional Orders / Instructions Follow Nutritious Diet - Try [name] get 70-100 grams of Protein a day+ Wound Treatment Wound #2 - Calcaneus Wound Laterality: Left Cleanser: Normal Saline (Generic) 1 x Per Day/30 Days Discharge Instructions: Cleanse the wound with Normal Saline prior [name] applying a clean dressing using gauze sponges, not tissue or cotton balls. Cleanser: Wound Cleanser (Generic) 1 x Per Day/30 Days Discharge Instructions: Cleanse the wound with wound cleanser prior [name] applying a clean dressing using gauze sponges, not tissue or cotton balls. Peri-Wound Care: Ketoconazole Cream 2% 1 x Per Day/30 Days Discharge Instructions: Apply Ketoconazole mixed with zinc [name] the periwound Peri-Wound Care: Zinc Oxide Ointment  30g tube 1 x Per Day/30 Days Discharge Instructions: Apply Zinc Oxide [name] periwound with each dressing change Topical: Gentamicin (Generic) 1 x Per Day/30 Days Discharge Instructions: apply [name] the wound bed Prim Dressing: Maxorb Extra Ag+ Alginate Dressing, 4x4.75 (in/in) (Generic) 1 x Per Day/30 Days ary Discharge Instructions: Apply [name] wound bed as instructed Secondary Dressing: Drawtex 4x4 in (Generic) 1 x Per Day/30 Days Discharge Instructions: Apply over primary dressing as directed. Secondary Dressing: Optifoam Non-Adhesive Dressing, 4x4 in (Generic) 1 x Per Day/30 Days Discharge Instructions: Apply over primary dressing as directed. Secondary Dressing: Woven Gauze Sponge, Non-Sterile 4x4 in (Generic) 1 x Per Day/30 Days Discharge Instructions: Apply over primary dressing as directed. Secured With: 39M Medipore H Soft Cloth Surgical T ape, 4 x 10 (in/yd) (Generic) 1 x Per Day/30 Days Discharge Instructions: Secure with tape as directed. Compression Wrap: Kerlix Roll 4.5x3.1 (in/yd) (Generic) 1 x Per Day/30 Days Discharge Instructions: Apply Kerlix and Coban compression as directed. Add-Ons: [employer] Offloading Boot, Size Regular 1 x Per Day/30 Days SYD, NEWSOME ([id number]) [phone].pdf Page 8 of 17 Electronic Signature(s) Signed: 06/12/2023 4:14:53 PM By: [name] Previous Signature: 02/27/2023 6:34:47 PM Version By: [name] Deed RN, BSN Previous Signature: 02/28/2023 7:56:46 AM Version By: [name] Entered By: [name] on 03/07/2023 05:10:57 -------------------------------------------------------------------------------- Problem List Details Patient Name: Date of Service: [name], [name] [place] E. 02/24/2023 9:30 A M Medical Record Number: [id number] Patient Account Number: [id number] Date of Birth/Sex: Treating RN: 07-Dec-1975 (47 y.o. M) Primary Care Provider: Dorinda [name] Other Clinician: Referring  Provider: Treating Provider/Extender: [name] in Treatment: 136 Active Problems ICD-10 Encounter Code Description Active Date MDM Diagnosis L97.522 Non-pressure chronic ulcer of other part of left foot with fat layer exposed 09/03/2020 No Yes Inactive Problems ICD-10 Code Description Active Date Inactive Date L97.512 Non-pressure chronic ulcer of other part of right foot with fat layer exposed 09/03/2020 09/03/2020 L89.893 Pressure ulcer of other site, stage 3 07/15/2020 07/15/2020 M62.81 Muscle weakness (generalized) 07/15/2020 07/15/2020 I10 Essential (primary) hypertension 07/15/2020 07/15/2020 M86.171 Other acute osteomyelitis, right ankle and foot 09/03/2020 09/03/2020 Resolved Problems Electronic Signature(s) Signed: 03/07/2023 8:09:37 AM By: [name] Previous Signature: 02/27/2023 11:21:15 AM Version By: [name] Entered By: [name] on 03/07/2023 05:09:37 -------------------------------------------------------------------------------- Progress Note Details Patient Name: Date of Service: [name], [name] [place] E. 02/24/2023 9:30 A M Medical Record Number: [id number] Patient Account Number: [id number] LEEMON, AYALA ([id number]) [phone].pdf Page 9 of 17 Date of Birth/Sex: Treating RN: 04/09/1976 (47 y.o. M) Primary Care Provider: Other Clinician: Dorinda [name] Referring Provider: Treating Provider/Extender: [name] in Treatment: 136 Subjective  Chief Complaint Information obtained from Patient Bilateral Plantar Foot Ulcers History of Present Illness (HPI) Wounds are12/03/2020 upon evaluation today patient presents for initial inspection here in our clinic concerning issues he has been having with the bottoms of his feet bilaterally. He states these actually occurred as wounds when he was hospitalized for 5 months secondary [name] Covid. He was apparently with tilting bed where  he was in an upright position quite frequently and apparently this occurred in some way shape or form during that time. Fortunately there is no sign of active infection at this time. No fevers, chills, nausea, vomiting, or diarrhea. With that being said he still has substantial wounds on the plantar aspects of his feet Theragen require quite a bit of work [name] get these [name] heal. He has been using Santyl currently though that is been problematic both in receiving the medication as well as actually paid for it as it is become quite expensive. Prior [name] the experience with Covid the patient really did not have any major medical problems other than hypertension he does have some mild generalized weakness following the Covid experience. 07/22/2020 on evaluation today patient appears [name] be doing okay in regard [name] his foot ulcers I feel like the wound beds are showing signs of better improvement that I do believe the Iodoflex is helping in this regard. With that being said he does have a lot of drainage currently and this is somewhat blue/green in nature which is consistent with Pseudomonas. I do think a culture today would be appropriate for [place] [name] evaluate and see if that is indeed the case I would likely start him on antibiotic orally as well he is not allergic [name] Cipro knows of no issues he has had in the past 12/21; patient was admitted [name] the clinic earlier this month with bilateral presumed pressure ulcers on the bottom of his feet apparently related [name] excessive pressure from a tilt table arrangement in the intensive care unit. Patient relates this [name] being on ECMO but I am not really sure that is exactly related [name] that. I must say I have never seen anything like this. He has fairly extensive full-thickness wounds extending from his heel towards his midfoot mostly centered laterally. There is already been some healing distally. He does not appear [name] have an arterial issue. He has been using gentamicin [name]  the wound surfaces with Iodoflex [name] help with ongoing debridement 1/6; this is a patient with pressure ulcers on the bottom of his feet related [name] excessive pressure from a standing position in the intensive care unit. He is complaining of a lot of pain in the right heel. He is not a diabetic. 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 We have been using Iodoflex [name] help prepare the surfaces of both wounds for an advanced treatment product. He is nonambulatory spending most of his time in a wheelchair I have asked him not [name] propel the wheelchair with his heels 1/13; in general his wounds look better not much surface area change we have been using Iodoflex as of last week. I did an x-ray of the right heel as the patient was complaining of pain. I had some thoughts about a stress fracture perhaps Achilles tendon problems however what it showed was erosive changes along the inferior aspect of the calcaneus he now has a MRI booked for 1/20. 1/20; in general his wounds continue [name] be better. Some improvement in the large narrow areas proximally in  his foot. He is still complaining of pain in the right heel and tenderness in certain areas of this wound. His MRI is tonight. I am not just looking for osteomyelitis that was brought up on the x-ray I am wondering about stress fractures, tendon ruptures etc. He has no such findings on the left. Also noteworthy is that the patient had critical illness neuropathy and some of the discomfort may be actual improvement in nerve function I am just not sure. These wounds were initially in the setting of severe critical illness related [name] COVID-19. He was put in a standing position. He may have also been on pressors at the point contributing [name] tissue ischemia. By his description at some point these wounds were grossly necrotic extending proximally up into the Achilles part of his heel. I do not know that I have ever really seen pictures of them  like this although they may exist in epic We have ordered Tri layer Oasis. I am trying [name] stimulate some granulation in these areas. This is of course assuming the MRI is negative for infection 1/27; since the patient was last here he saw Dr. Earlene Plater of infectious disease. He is planned for vancomycin and ceftriaxone. Prior operative culture grew MSSA. Also ordered baseline lab work. He also ordered arterial studies although the ABIs in our clinic were normal as well as his clinical exam these were normal I do not think he needs [name] see vascular surgery. His ABIs at the [facility] were 1.22 in the right triphasic waveforms with a normal TBI of 1.15 on the left ABI of 1.22 with triphasic waveforms and a normal TBI of 1.08. Finally he saw Dr. Logan Bores who will follow him in for 2 months. At this point I do not think he felt that he needed a procedure on the right calcaneal bone. Dr. Earlene Plater is elected for broad-spectrum antibiotic The patient is still having pain in the right heel. He walks with a walker 2/3; wounds are generally smaller. He is tolerating his IV antibiotics. I believe this is vancomycin and ceftriaxone. We are still waiting for Oasis burn in terms of his out-of-pocket max which he should be meeting soon given the IV antibiotics, MRIs etc. I have asked him [name] check in on this. We are using silver collagen in the meantime the wounds look better 2/10; tolerating IV vancomycin and Rocephin. We are waiting [name] apply for Oasis. Although I am not really sure where he is in his out-of-pocket max. 2/17 started the first application of Oasis trilayer. Still on antibiotics. The wounds have generally look better. The area on the left has a little more surface slough requiring debridement 2/24; second application of Oasis trilayer. The wound surface granulation is generally look better. The area on the left with undermining laterally I think is come in a bit. 10/08/2020 upon evaluation today patient is here  today for [employer] application #3. Fortunately he seems [name] be doing extremely well with regard [name] this and we are seeing a lot of new epithelial growth which is great news. Fortunately there is no signs of active infection at this time. 10/16/2020 upon evaluation today patient appears [name] be doing well with regard [name] his foot ulcers. Do believe the Oasis has been of benefit for him. I do not see any signs of infection right now which is great news and I think that he has a lot of new epithelial growth which is great [name] see as well. The patient is very  pleased [name] hear all of this. I do think we can proceed with the Oasis trilayer #4 today. 3/18; not as much improvement in these areas on his heels that I was hoping. I did reapply trilateral Oasis today the tissue looks healthier but not as much fill in as I was hoping. 3/25; better looking today I think this is come in a bit the tissue looks healthier. Triple layer Oasis reapplied #6 4/1; somewhat better looking definitely better looking surface not as much change in surface area as I was hoping. He may be spending more time [place] on days then he needs [name] although he does have heel offloading boots. Triple layer Oasis reapplied #7 4/7; unfortunately apparently [facility] will not approve any further Oasis which is unfortunate since the patient did respond nicely both in terms of the condition of the wound bed as well as surface area. There is still some drainage coming from the wound but not a lot there does not appear [name] be any infection 4/15; we have been using Hydrofera Blue. He continues [name] have nice rims of epithelialization on the right greater than the left. The left the epithelialization is coming from the tip of his heel. There is moderate drainage. In this that concerns me about a total contact cast. There is no evidence of infection 4/29; patient has been using Hydrofera Blue with dressing changes. He has no complaints or  issues today. 5/5; using Hydrofera Blue. I actually think that he looks marginally better than the last time I saw this 3 weeks ago. There are rims of epithelialization on the left thumb coming from the medial side on the right. Using [address] MITHRAN, STRIKE ([id number]) [id number] Page 10 of 17 5/12; using Hydrofera Blue. These continue [name] make improvements in surface area. His drainage was not listed as severe I therefore went ahead and put a cast on the left foot. Right foot we will continue [name] dress his previous 5/16; back for first total contact cast change. He did not tolerate this particularly well cast injury on the anterior tibia among other issues. Difficulty sleeping. I talked him about this in some detail and afterwards is elected [name] continue. I told him I would like [name] have a cast on for 3 weeks [name] see if this is going [name] help at all. I think he agreed 5/19; I think the wound is better. There is no tunneling towards his midfoot. The undermining medially also looks better. He has a rim of new skin distally. I think we are making progress here. The area on the left also continues [name] look somewhat better [name] me using Hydrofera Blue. He has a list of complaints about the cast but none of them seem serious 5/26; patient presents for 1 week follow-up. He has been using a total contact cast and tolerating this well. Hydrofera Blue is the main dressing used. He denies signs of infection. 6/2 Hydrofera Blue total contact cast on the left. These were large ulcers that formed in intensive care unit where the patient was recovering from COVID. May have had something to do with being ventilated in an upright positiono Pressors etc. We have been able [name] get the areas down considerably and a viable surface. There is some epithelialization in both sides. Note made of drainage 6/9; changed [name] [facility] last time because of drainage. He arrives with better looking  surfaces and dimensions on the left than the right. Paradoxically the right actually probes  more towards his midfoot the left is largely close down but both of these look improved. Using a total contact cast on the left 6/16; complex wounds on his bilateral plantar heels which were initially pressure injury from a stay in the ICU with COVID. We have been using silver alginate most recently. His dimensions of come in quite dramatically however not recently. We have been putting the left foot in a total contact cast 6/23; complex wounds on the bilateral plantar heels. I been putting the left in the cast paradoxically the area on the right is the one that is going towards closure at a faster rate. Quite a bit of drainage on the left. The patient went [name] see Dr. Logan Bores who said he was going [name] standby for skin grafts. I had actually considered sending him for skin grafts however he would be mandatorily off his feet for a period of weeks [name] months. I am thinking that the area on the right is going [name] close on its own the area on the left has been more stubborn even though we have him in a total contact cast 6/30; took him out of a total contact cast last week is the right heel seem [name] be making better progress than the left where I was placing the cast. We are using silver alginate. Both wounds are smaller right greater than left 7/12; both wounds look as though they are making some progress. We are using silver alginate. Heel offloading boots 7/26; very gradual progress especially on the right. Using silver alginate. He is wearing heel offloading boots 8/18; he continues [name] close these wounds down very gradually. Using silver alginate. The problem polymen being definitive about this is areas of what appears [name] be callus around the margins. This is not a 100% of the area but certainly sizable especially on the right 9/1; bilateral plantar feet wounds secondary [name] prolonged pressure while being ventilated  for COVID-19 in an upright position. Essentially pressure ulcers on the bottom of his feet. He is made substantial progress using silver alginate. 9/14; bilateral plantar feet wounds secondary [name] prolonged pressure. Making progress using silver alginate. 9/29 bilateral plantar feet wounds secondary [name] prolonged pressure. I changed him [name] Iodoflex last week. MolecuLight showing reddened blush fluorescence 10/11; patient presents for follow-up. He has no issues or complaints today. He denies signs of infection. He continues [name] use Iodoflex and antibiotic ointment [name] the wound beds. 10/27; 2-week follow-up. No evidence of infection. He has callus and thick dry skin around the wound margins we have been using Iodoflex and Bactroban which was in response [name] a moderate left MolecuLight reddish blush fluorescence. 11/10; 2-week follow-up. Wound margins again have thick callus however the measurements of the actual wound sites are a lot smaller. Everything looks reasonably healthy here. We have been using Iodoflex He was approved for prime matrix but I have elected [name] delay this given the improvement in the surface area. Hopefully I will not regret that decision as were getting close [name] the end of the year in terms of insurance payment 12/8; 2-week follow-up. Wounds are generally smaller in size. These were initially substantial wounds extending into the forefoot all the way into the heel on the bilateral plantar feet. They are now both located on the plantar heel distal aspect both of these have a lot of callus around the wounds I used a #5 curette [name] remove this on the right and the left also some subcutaneous debris [name] try and  get the wound edges were using Iodoflex. He has heel offloading shoe 12/22; 2-week follow-up. Not really much improvement. He has thick callus around the outer edges of both wounds. I remove this there is some nonviable subcutaneous tissue as well. We have been using Iodoflex. Her  intake nurse and myself spontaneously thought of a total contact cast I went back in May. At that time we really were not seeing much of an improvement with a cast although the wound was in a much different situation I would like [name] retry this in 2 weeks and I discussed this with the patient 08/12/2021; the patient has had some improvement with the Iodoflex. The the area on the left heel plantar more improved than the right. I had [name] put him in a total contact cast on the left although I decided [name] put that off for 2 weeks. I am going [name] change his primary dressing [name] silver collagen. I think in both areas he has had some improvement most of the healing seems [name] be more proximal in the heel. The wounds are in the mid aspect. A lot of thick callus on the right heel however. 1/19; we are using silver collagen on both plantar heel areas. He has had some improvement today. The left did not require any debridement. He still had some eschar on the right that was debrided but both seem [name] have contracted. I did not put it total contact cast on him today 2/2 we have been using silver collagen. The area on the right plantar heel has areas that appear [name] be epithelialized interspersed with dry flaking callus and dry skin. I removed this. This really looks better than on the other side. On the left still a large area with raised edges and debris on the surface. 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 He has no issues or complaints today. 2/9; not much change in the left foot wound with 1 week of a cast we are using silver collagen. Silver collagen on the right side. The right side has been the better wound surface. We will reapply the total contact cast on the left 2/16; not much improvement on either side I been using silver collagen with a total contact cast on the left. I'm  changing the Hydrofera Blue still with a total contact cast on the left 2/23; some improvement on both sides. Disappointing that he has thick callus around the area that we are putting in a total contact cast on the left. We've been using Hydrofera Blue on both wound areas. This is a man who at essentially pressure ulcers in addition [name] ischemia caused by medications [name] support his blood pressure (pressors) in the ICU. He was being ventilated in the standing position for severe Covid. A Shiley the wounds extended across his entire foot but are now localized [name] his plantar heels bilaterally. We have made progress however neither areas healed. I continue [name] think the total contact cast is helped albeit painstakingly slowly. He has never wanted a plastic surgery consult although I don't know that they would be interested in grafting in area in this location. 10/07/2021: Continued improvement bilaterally. There is still some callus around the left wound, despite the total contact cast. He has some increased pain in his right midfoot around 1 particular area. This has been painful in the past but  seems [name] be a little bit worse. When his cast was removed today, there was an area on the heel of the left foot that looks a bit macerated. He is also complaining of pain in his left thigh and hip which he thinks is secondary [name] the limb length discrepancy caused by the cast. 10/14/2021: He continues [name] improve. A little bit less callus around the left wound. He continues [name] endorse pain in his right midfoot, but this is not as significant as it was last week. The maceration on his left heel is improved. 10/21/2021: Continued improvement [name] both wounds. The maceration on his left heel is no longer evident. Less callus bilaterally. Epithelialization progressing. 10/28/2021: Significant improvement this week. The right sided wound is nearly closed with just a small open area at the middle. No maceration seen on the  left heel. Continued epithelialization on both sides. No concern for infection. 11/04/2021: T oday, the wounds were measured a little bit differently and come out as larger, but I actually think they are about the same [name] potentially even smaller, particularly on the left. He continues [name] accumulate some callus on the right. ELMOND, POEHLMAN ([id number]) [id number] Page 11 of 17 11/11/2021: T oday, the patient is expressing some concern that the left wound, despite being in the total contact cast, is not progressing at the same rate as the 1 on the right. He is interested in trying a week without the cast [name] see how the wound does. The wounds are roughly the same size as last week, with the right perhaps being a little bit smaller. He continues [name] build up callus on both sites. 11/18/2021: Last week, I permitted the patient [name] go without his total contact cast, just [name] see if the cast was really making any difference. Today, both wounds have deteriorated [name] some extent, suggesting that the cast is providing benefit, at least on the left. Both are larger and have accumulated callus, slough, and other debris. 11/26/2021: I debrided both wounds quite aggressively last week in an effort [name] stimulate the healing cascade. This appears [name] have been effective as the left sided wound is a full centimeter shorter in length. Although the right was measured slightly larger, on inspection, it looks as though an area of epithelialized tissue was included in the measurements. We have been using PolyMem Ag on the wound surfaces with a total contact cast [name] the left. 12/02/2021: It appears that the intake personnel are including epithelialized tissue in his wound measurements; the right wound is almost completely epithelialized; there is just a crater at the proximal midfoot with some open areas. On the left, he has built up some callus, but the overall wound surface looks good. There is some  senescent skin around the wound margin. He has been in PolyMem Ag bilaterally with a total contact cast on the left. 12/09/2021: The right wound is nearly closed; there is just a small open area at the mid calcaneus. On the left, the wound is smaller with minimal callus buildup. No significant drainage. 12/16/2021: The right calcaneal wound remains minimally open at the mid calcaneus; the rest has epithelialized. On the left, the wound is also a little bit smaller. There is some senescent tissue on the periphery. He is getting his first application of a trial skin substitute called Vendaje today. 12/23/2021: The wound on his right calcaneus is nearly closed; there is just a small area at the most distal aspect of the calcaneus that is open.  On the left, the area where we applied [name] the skin substitute has a healthier-looking bed of granulation tissue. The wound dimensions are not significantly different on this side but the wound surface is improved. 12/30/2021: The wound on the right calcaneus has not changed significantly aside from some accumulation of callus. On the left, the open area is smaller and continues [name] have an improved surface. He continues [name] accumulate callus around the wound. He is here for his third application of Vendaje. 01/06/2022: The right calcaneal wound is down [name] just a couple of millimeters. He continues [name] accumulate periwound callus. He unfortunately got his cast wet earlier in the week and his left foot is macerated, resulting in some superficial skin loss just distal [name] the open wound. The open wound itself, however, is much smaller and has a healthier appearing surface. He is here for his fourth application of Vendaje. 01/13/2022: The right calcaneal wound is about the same. Unfortunately, once again, his cast got wet and his foot is again macerated. This is caused the left calcaneal wound [name] enlarge. He is here for his fifth application of Vendaje. 01/20/2022: The right  calcaneal wound is very small. There is some periwound callus accumulation. He purchased a new cast protector last week and this has been effective in avoiding the maceration that has been occurring on the left. The left calcaneal wound is narrower and has a healthy and viable-appearing surface. He is here for his 6 application of Vendaje. 01/27/2022: The right calcaneal wound is down [name] just a pinhole. There is some periwound slough and callus. On the left, the wound is narrower and shorter by about a centimeter. The surface is robust and viable-appearing. Unfortunately, the rep for the trial skin substitute product did not provide any for [place] [name] use today. 02/04/2022: The right calcaneal wound remains unchanged. There is more accumulated callus. On the left, although the intake nurse measured it a little bit longer, it looks about the same [name] me. The surface has a layer of slough, but underneath this, there is good granulation tissue. 02/10/2022: The right calcaneus wound is nearly closed. There is still some callus that builds up around the site. The left side looks about the same in terms of dimensions, but the surface is more robust and vital-appearing. 02/16/2022: The area of the right calcaneus that was nearly closed last week has closed, but there is a small opening at the mid foot where it looks like some moisture got retained and caused some reopening. The left foot wound is narrower and shallower. Both sites have a fair amount of periwound callus and eschar. 02/24/2022: The small midfoot opening on the right calcaneus is a little bit smaller today. The left foot wound is narrower and shallower. He continues [name] accumulate periwound callus. No concern for infection. 03/01/2022: The patient came [name] clinic early because he showered and got his cast wet. Fortunately, there is no significant maceration [name] his foot but the callus softened and it looks like the wound on his left calcaneus may be a little  bit wider. The wound on his right calcaneus is just a narrow slit. Continued accumulation of periwound callus bilaterally. 03/08/2022: The wound on his right calcaneus is very nearly closed, just a small pinpoint opening under a bit of eschar; the left wound has come in quite a bit, as well. It is narrower and shorter than at our last visit. Still with accumulated callus and eschar bilaterally. 03/17/2022: The right calcaneal wound  is healed. The left wound is smaller and the surface itself is very clean, but there is some blue-green staining on the periwound callus, concerning for Pseudomonas aeruginosa. 03/23/2022: The right calcaneal wound remains closed. The left wound continues [name] contract. No further blue-green staining. Small amount of callus and slough accumulation. 03/28/2022: He came in early today because he had gotten his cast wet. On inspection, the wound itself did not get wet or macerated, just a little bit of the forefoot. The wound itself is basically unchanged. 04/07/2022: The right foot wound remains closed. The left wound is the smallest that I have seen it [name] date. It is narrower and shorter. It still continues [name] accumulate slough on the surface. 04/15/2022: There is a band of epithelium now dividing the small left plantar foot wound in 2. There is still some slough on the surface. 04/21/2022: The wound continues [name] narrow. Just a little bit of slough on the surface. He seems [name] be responding well [name] endoform. 04/28/2022: Continued slow contraction of the wound. There is a little slough on the surface and some periwound callus. We have been using endoform and total contact cast. 05/05/2022: The wound appears [name] have stalled. There is slough and some periwound eschar/callus. No concern for infection, however. 05/12/2022: Unfortunately, his right foot has reopened. It is located at the most posterior aspect of his surgical incision. The area was noted [name] have drainage coming from it when  his padding was removed today. Underneath some callus and senescent skin, there is an opening. No purulent drainage or malodor. On the left foot, the wound is again unchanged. There is some light blue staining on the callus, but no malodor or purulent drainage. 10/13; right and left heel remanence of extensive plantar foot wounds. These are better than I remember by quite a big margin however he is still left with wounds on the left plantar heel and the right plantar heel. Been using endoform bilaterally. A culture was done that showed apparently Pseudomonas but we are still waiting for the [facility] antibiotic [name] use gentamicin today. He is still very active by description I am not sure about the offloading of his noncasted right EDMUNDO, TEDESCO ([id number]) [id number] Page 12 of 17 foot 10/20; both wounds right and left heel debrided not much change from last week. [name] Echevaria has arrived which is linezolid, gentamicin and ciprofloxacin we will use this with endoform. T contact cast on the left otal 06/02/2022: Both wounds are smaller today. There is still a fair amount of callus buildup around the right foot ulcer. The left is more superficial and nearly flush with the surrounding tissues. Also with slough and eschar buildup. 06/10/2022: The right sided wound appears [name] be nearly closed, if not completely so, although it is somewhat difficult [name] tell given the abnormal tissue and scarring in his foot. There is a fair amount of callus and crust accumulation. On the left, the wound looks about the same, again with callus and slough. He has an appointment next Thursday with Dr. Annamary Rummage in podiatry; I am hopeful that there may be some reconstructive options available for Mr. Kye. 06/16/2022: Both wounds have some eschar and callus accumulation. The right sided wound is extremely narrow and barely open; the left is narrower than last week. There is a little bit of slough.  He has his appointment in podiatry later today. 06/23/2022: The patient met with Dr. Annamary Rummage last week and unfortunately, there are no reconstructive options that he believes would  be helpful. He did order an MRI [name] evaluate for osteomyelitis and fortunately, none was seen. The left sided wound is a little bit shorter and narrower today. The right sided wound is about the same. There is callus and eschar accumulation bilaterally. 06/29/2022: Both feet have improved from last week. There is epithelium making a valiant effort [name] creep across the surface on the left. The right side looks like it got a little dry and the deep crevasse in his midfoot has cracked. Both have eschar and there is some slough on the left. 07/07/2022: Both feet have improved. There is epithelium completely covering the calcaneus on the right with just a small opening in the crevasse in his midfoot. On the left, the open area of tissue is smaller but he continues [name] build up callus/eschar and slough. 07/15/2022: The opening in the midfoot on the right is about the same size, covered with eschar and a little bit of slough. The open portion of the left wound is narrower and shorter with a bit of slough buildup. He admits [name] being on his feet more than recommended. 12/14; as far as I can tell everything on the right foot is closed. There is some eschar I removed some of this I cannot identify any open wound here. As usual this will be a very vulnerable area going forward. On the left this looks really quite healthy. I was pleasantly surprised [name] see how good this looked. Wound is certainly smaller and there appears [name] be healthy epithelialization. He has been using Promogran on the right and endoform on the left. He has been offloading the right foot with a heel offloading boot and he has a running shoe on the right foot 08/04/2022: The right foot remains closed. He has a thick cushioned insole in his sneaker. The left sided wound  is smaller with just some slough and eschar accumulation. He is wearing the heel off loader on this foot. 08/15/2022: The right foot remains closed. The left wound has narrowed further. There is some slough and eschar accumulation. 08/25/2022: We put him in a peg assist shoe insert and as a result, he has more epithelialization of the ulcer with minimal slough and eschar accumulation. 09/01/2022: The wound is smaller by about half this week. Still with some slough on the surface. The peg assist shoe seems [name] be doing a remarkable job of adequately offloading the site. 09/08/2022: There is a little bit more epithelium coming in. There is some slough and callus buildup. 09/16/2022: The wound measures about the same size, but the epithelium that has grown in looks more robust and stronger. There is some slough and callus buildup. 09/23/2022: The wound remains about the same size. The skin edges are looking rather senescent. 09/29/2022: The aggressive debridement I performed last week seems [name] have been effective. The wound is smaller and has significantly less slough accumulation. 10/06/2022: There has been quite a bit of epithelialization since last week. There are still some open areas with slough accumulation. There is some callus buildup around the perimeter. 10/13/2022: Continued improvement. Minimal callus accumulation. 3/14; patient presents for follow-up. He has been using endoform [name] the wound bed without issues. He is using a surgical shoe with peg assist for offloading. 10/27/2022: The wound dimensions are stable. There is some senescent skin accumulation around the perimeter. 11/04/2022: Last week I performed a very aggressive debridement in an effort [name] stimulate the healing cascade. As has been the case when I have done this  before, the wound has responded well. There has been epithelialization and contraction of the wound. There are just a couple of small open areas. 11/10/2022: Unfortunately, his foot  got wet secondary [name] sweating and there has been some breakdown of the tissue, particularly the skin just adjacent [name] the main ulcer. 11/18/2022: We continue [name] struggle with moisture-related tissue breakdown around the perimeter of his wound. This has caused the thin epithelium that had formed on the surface [name] disintegrate. The underlying surface of the wound has good granulation tissue. 11/25/2022: The edges of the wound are much less macerated, but the surface is actually looking a little bit dry. There is slough accumulation. No obvious signs of infection. 12/01/2022: The wound edges are more macerated and broken down, making the wound larger. The surface has some slough on it. 12/08/2020: The wound looks better this week. There is significantly less maceration and moisture-related tissue breakdown. There is some slough on the wound surface. 12/15/2022: The wound continues [name] improve. There is almost no moisture-related tissue breakdown. There is epithelium beginning [name] fill in again from the edges. Light slough on the wound surface. 12/22/2022: More epithelium has filled him from around the edges. There is some slough on the surface. No evidence of moisture-related tissue breakdown. 01/05/2023: There has been more epithelialization, particularly at the posterior calcaneal aspect of the wound. There has been a little bit of moisture accumulation with minimal maceration. 01/12/2023: More epithelium has filled in. There is very minimal accumulation of slough. 01/20/2023: The wound is measuring a little bit smaller today. It did measure deeper, but this appears [name] be secondary [name] some heaped up senescent skin around the edges. The epithelium that has been present at the posterior aspect of the wound seems [name] be a bit more robust with greater integrity. KAVEON, BLATZ ([id number]) [id number] Page 13 of 17 01/26/2023: He reports intense itching [name] the skin around the wound and there  has been some breakdown here. It looks fungal in nature. The wound itself looks pretty good with improved epithelialization with just some slough buildup. He has a little callus along the wound edges. 02/02/2023: He has been treating the periwound skin with an over-the-counter athlete's foot medication and notes significant improvement in his pruritus. The skin looks better here, as well. The wound continues [name] epithelialize. There is light slough accumulation. 02/10/2023: The periwound skin still looks fairly angry. He reports multiple small blisters that have opened up. He is not having the intense itching that he had prior, however. There has been more epithelialization at the calcaneus and there is minimal slough accumulation. 02/16/2023: The periwound skin is improving. An online review of dermatology rash images suggest that this may be hand-foot-and-mouth disease, isolated [name] the feet; the patient says he actually had this as a child. The calcaneus continues [name] epithelialize and the tissue was quite robust here. Very minimal slough accumulation on the open portion of the wound with a little bit of periwound callus buildup. 02/24/23: Itching resolved, no issues. Patient History Information obtained from Patient. Family History Cancer - Maternal Grandparents, Diabetes - Father,Paternal Grandparents, Heart Disease - Maternal Grandparents, Hypertension - Father,Paternal Grandparents, Lung Disease - Siblings, No family history of Hereditary Spherocytosis, Kidney Disease, Seizures, Stroke, Thyroid Problems, Tuberculosis. Social History Never smoker, Marital Status - Married, Alcohol Use - Never, Drug Use - No History, Caffeine Use - Daily - tea, soda. Medical History Eyes Denies history of Cataracts, Glaucoma, Optic Neuritis Ear/Nose/Mouth/Throat Denies history of Chronic sinus problems/congestion,  Middle ear problems Hematologic/Lymphatic Denies history of Anemia, Hemophilia, Human  Immunodeficiency Virus, Lymphedema, Sickle Cell Disease Respiratory Patient has history of Asthma Denies history of Aspiration, Chronic Obstructive Pulmonary Disease (COPD), Pneumothorax, Sleep Apnea, Tuberculosis Cardiovascular Patient has history of Angina - with COVID, Hypertension Denies history of Arrhythmia, Congestive Heart Failure, Coronary Artery Disease, Deep Vein Thrombosis, Hypotension, Myocardial Infarction, Peripheral Arterial Disease, Peripheral Venous Disease, Phlebitis, Vasculitis Gastrointestinal Denies history of Cirrhosis , Colitis, Crohns, Hepatitis A, Hepatitis B, Hepatitis C Endocrine Denies history of Type I Diabetes, Type II Diabetes Genitourinary Denies history of End Stage Renal Disease Immunological Denies history of Lupus Erythematosus, Raynauds, Scleroderma Integumentary (Skin) Denies history of History of Burn Musculoskeletal Denies history of Gout, Rheumatoid Arthritis, Osteoarthritis, Osteomyelitis Neurologic Denies history of Dementia, Neuropathy, Quadriplegia, Paraplegia, Seizure Disorder Oncologic Denies history of Received Chemotherapy, Received Radiation Psychiatric Denies history of Anorexia/bulimia, Confinement Anxiety Hospitalization/Surgery History - COVID PNA 07/22/2019- 11/14/2019. - 03/27/2020 wound debridement/ skin graft. Medical A Surgical History Notes nd Constitutional Symptoms (General Health) COVID PNA 07/22/2019-11/14/2019 V[facility] ECMO, foot drop left foot , Genitourinary kidney stone Psychiatric anxiety Objective Constitutional no acute distress. Vitals Time Taken: 9:30 AM, Height: 69 in, Weight: 280 lbs, BMI: 41.3, Temperature: 98.3 F, Pulse: 80 bpm, Respiratory Rate: 20 breaths/min, Blood Pressure: 122/83 mmHg. Respiratory ALTAN, KRAAI ([id number]) [phone].pdf Page 14 of 17 Normal work of breathing on room air. General Notes: 02/24/23: Minimal moisture-breakdown at deepest part of wound. Light  slough with periwound callus. Integumentary (Hair, Skin) Wound #2 status is Open. Original cause of wound was Pressure Injury. The date acquired was: 10/07/2019. The wound has been in treatment 136 weeks. The wound is located on the Left Calcaneus. The wound measures 3.3cm length x 1.6cm width x 0.2cm depth; 4.147cm^2 area and 0.829cm^3 volume. There is Fat Layer (Subcutaneous Tissue) exposed. There is no tunneling or undermining noted. There is a medium amount of serosanguineous drainage noted. The wound margin is distinct with the outline attached [name] the wound base. There is large (67-100%) pink granulation within the wound bed. There is a small (1-33%) amount of necrotic tissue within the wound bed including Adherent Slough. The periwound skin appearance had no abnormalities noted for moisture. The periwound skin appearance exhibited: Scarring. The periwound skin appearance did not exhibit: Callus, Crepitus, Excoriation, Induration, Rash, Atrophie Blanche, Cyanosis, Ecchymosis, Hemosiderin Staining, Mottled, Pallor, Rubor, Erythema. Periwound temperature was noted as No Abnormality. The periwound has tenderness on palpation. Assessment Active Problems ICD-10 Non-pressure chronic ulcer of other part of left foot with fat layer exposed Procedures Wound #2 Pre-procedure diagnosis of Wound #2 is a Pressure Ulcer located on the Left Calcaneus . There was a Selective/Open Wound Non-Viable Tissue Debridement with a total area of 4.14 sq cm performed by [name] Guess, MD. With the following instrument(s): Curette [name] remove Non-Viable tissue/material. Material removed includes Callus and Slough and. No specimens were taken. A time out was conducted at 09:50, prior [name] the start of the procedure. A Minimum amount of bleeding was controlled with Pressure. The procedure was tolerated well. Post Debridement Measurements: 3.3cm length x 1.6cm width x 0.2cm depth; 0.829cm^3 volume. Post debridement Stage  noted as Category/Stage III. Character of Wound/Ulcer Post Debridement is improved. Post procedure Diagnosis Wound #2: Same as Pre-Procedure General Notes: scribed for Dr. Lady [name] by [name] Deed, RN. Plan Follow-up Appointments: Return Appointment in 1 week. - Dr. Lady [name] Room 3 Anesthetic: Wound #2 Left Calcaneus: (In clinic) Topical Lidocaine 4% applied [name] wound bed - In clinic  Bathing/ Shower/ Hygiene: May shower and wash wound with soap and water. Edema Control - Lymphedema / SCD / Other: Avoid standing for long periods of time. Moisturize legs daily. - as needed Off-Loading: Other: - keep pressure off of the bottom of your feet. Elevate legs throughout the day. Use the Shoe with the PegAssist off-loading insole Additional Orders / Instructions: Follow Nutritious Diet - Try [name] get 70-100 grams of Protein a day+ WOUND #2: - Calcaneus Wound Laterality: Left Cleanser: Normal Saline (Generic) 1 x Per Day/30 Days Discharge Instructions: Cleanse the wound with Normal Saline prior [name] applying a clean dressing using gauze sponges, not tissue or cotton balls. Cleanser: Wound Cleanser (Generic) 1 x Per Day/30 Days Discharge Instructions: Cleanse the wound with wound cleanser prior [name] applying a clean dressing using gauze sponges, not tissue or cotton balls. Peri-Wound Care: Ketoconazole Cream 2% 1 x Per Day/30 Days Discharge Instructions: Apply Ketoconazole mixed with zinc [name] the periwound Peri-Wound Care: Zinc Oxide Ointment 30g tube 1 x Per Day/30 Days Discharge Instructions: Apply Zinc Oxide [name] periwound with each dressing change Topical: Gentamicin (Generic) 1 x Per Day/30 Days Discharge Instructions: apply [name] the wound bed Prim Dressing: Maxorb Extra Ag+ Alginate Dressing, 4x4.75 (in/in) (Generic) 1 x Per Day/30 Days ary Discharge Instructions: Apply [name] wound bed as instructed Secondary Dressing: Drawtex 4x4 in (Generic) 1 x Per Day/30 Days Discharge Instructions: Apply over primary  dressing as directed. Secondary Dressing: Optifoam Non-Adhesive Dressing, 4x4 in (Generic) 1 x Per Day/30 Days Discharge Instructions: Apply over primary dressing as directed. Secondary Dressing: Woven Gauze Sponge, Non-Sterile 4x4 in (Generic) 1 x Per Day/30 Days Discharge Instructions: Apply over primary dressing as directed. Secured With: 32M Medipore H Soft Cloth Surgical T ape, 4 x 10 (in/yd) (Generic) 1 x Per Day/30 Days Discharge Instructions: Secure with tape as directed. Com pression Wrap: Kerlix Roll 4.5x3.1 (in/yd) (Generic) 1 x Per Day/30 Days Discharge Instructions: Apply Kerlix and Coban compression as directed. Add-Ons: [employer] Offloading Boot, Size Regular 1 x Per Day/30 Days SAXON, BARICH ([id number]) [phone].pdf Page 15 of 17 Continued (slow) improvement 1. Debrided callus and slough with curette. 2. Continue gentamicin, AgAlg, drawtex. 3. Continue [name] protect from moisture. 4. Follow up in one week. Electronic Signature(s) Signed: 03/07/2023 8:11:19 AM By: [name] Previous Signature: 03/03/2023 4:29:54 PM Version By: Thayer Dallas Previous Signature: 03/06/2023 10:26:58 AM Version By: [name] Entered By: [name] on 03/07/2023 05:11:19 -------------------------------------------------------------------------------- HxROS Details Patient Name: Date of Service: [name], [name] [place] E. 02/24/2023 9:30 A M Medical Record Number: [id number] Patient Account Number: [id number] Date of Birth/Sex: Treating RN: September 14, 1975 (47 y.o. M) Primary Care Provider: Dorinda [name] Other Clinician: Referring Provider: Treating Provider/Extender: [name] in Treatment: 136 Information Obtained From Patient Constitutional Symptoms (General Health) Medical History: Past Medical History Notes: COVID PNA 07/22/2019-11/14/2019 V[facility] ECMO, foot drop left foot , Eyes Medical History: Negative for:  Cataracts; Glaucoma; Optic Neuritis Ear/Nose/Mouth/Throat Medical History: Negative for: Chronic sinus problems/congestion; Middle ear problems Hematologic/Lymphatic Medical History: Negative for: Anemia; Hemophilia; Human Immunodeficiency Virus; Lymphedema; Sickle Cell Disease Respiratory Medical History: Positive for: Asthma Negative for: Aspiration; Chronic Obstructive Pulmonary Disease (COPD); Pneumothorax; Sleep Apnea; Tuberculosis Cardiovascular Medical History: Positive for: Angina - with COVID; Hypertension Negative for: Arrhythmia; Congestive Heart Failure; Coronary Artery Disease; Deep Vein Thrombosis; Hypotension; Myocardial Infarction; Peripheral Arterial Disease; Peripheral Venous Disease; Phlebitis; Vasculitis Gastrointestinal Medical History: Negative for: Cirrhosis ; Colitis; Crohns; Hepatitis A; Hepatitis B;  Hepatitis C Endocrine Medical History: Negative for: Type I Diabetes; Type II Diabetes PERSHING, SKIDMORE ([id number]) [phone].pdf Page 16 of 17 Genitourinary Medical History: Negative for: End Stage Renal Disease Past Medical History Notes: kidney stone Immunological Medical History: Negative for: Lupus Erythematosus; Raynauds; Scleroderma Integumentary (Skin) Medical History: Negative for: History of Burn Musculoskeletal Medical History: Negative for: Gout; Rheumatoid Arthritis; Osteoarthritis; Osteomyelitis Neurologic Medical History: Negative for: Dementia; Neuropathy; Quadriplegia; Paraplegia; Seizure Disorder Oncologic Medical History: Negative for: Received Chemotherapy; Received Radiation Psychiatric Medical History: Negative for: Anorexia/bulimia; Confinement Anxiety Past Medical History Notes: anxiety Immunizations Pneumococcal Vaccine: Received Pneumococcal Vaccination: No Implantable Devices None Hospitalization / Surgery History Type of Hospitalization/Surgery COVID PNA 07/22/2019- 11/14/2019 03/27/2020  wound debridement/ skin graft Family and Social History Cancer: Yes - Maternal Grandparents; Diabetes: Yes - Father,Paternal Grandparents; Heart Disease: Yes - Maternal Grandparents; Hereditary Spherocytosis: No; Hypertension: Yes - Father,Paternal Grandparents; Kidney Disease: No; Lung Disease: Yes - Siblings; Seizures: No; Stroke: No; Thyroid Problems: No; Tuberculosis: No; Never smoker; Marital Status - Married; Alcohol Use: Never; Drug Use: No History; Caffeine Use: Daily - tea, soda; Financial Concerns: No; Food, Clothing or Shelter Needs: No; Support System Lacking: No; Transportation Concerns: No Electronic Signature(s) Signed: 06/12/2023 4:14:53 PM By: [name] Entered By: [name] on 03/07/2023 05:10:25 -------------------------------------------------------------------------------- SuperBill Details Patient Name: Date of Service: [name], [name] [place] E. 02/24/2023 Medical Record Number: [id number] Patient Account Number: [id number] Date of Birth/Sex: Treating RN: 01-Feb-1976 (47 y.o. M) Primary Care Provider: Dorinda [name] Other Clinician: Referring Provider: Treating Provider/Extender: Kalem, Rockwell ([id number]) [id number] Page 17 of 17 Weeks in Treatment: 136 Diagnosis Coding ICD-10 Codes Code Description [phone] Non-pressure chronic ulcer of other part of left foot with fat layer exposed Facility Procedures : CPT4 Code: [id number] Description: 97597 - DEBRIDE WOUND 1ST 20 SQ CM OR < ICD-10 Diagnosis Description L97.522 Non-pressure chronic ulcer of other part of left foot with fat layer exposed Modifier: Quantity: 1 Physician Procedures : CPT4 Code Description Modifier [id number] - WC PHYS LEVEL 4 - EST PT 25 ICD-10 Diagnosis Description L97.522 Non-pressure chronic ulcer of other part of left foot with fat layer exposed Quantity: 1 : [id number] - WC PHYS DEBR WO ANESTH 20 SQ CM ICD-10 Diagnosis  Description L97.522 Non-pressure chronic ulcer of other part of left foot with fat layer exposed Quantity: 1 Electronic Signature(s) Signed: 03/07/2023 8:11:26 AM By: [name] Previous Signature: 02/27/2023 11:21:40 AM Version By: [name] Entered By: [name] on 03/07/2023 05:11:26

## 2023-02-28 ENCOUNTER — Ambulatory Visit (HOSPITAL_COMMUNITY)
Admission: RE | Admit: 2023-02-28 | Discharge: 2023-02-28 | Disposition: A | Discharge status: Home / Self Care | Payer: PPO | Source: Ambulatory Visit | Attending: Surgery | Admitting: Surgery

## 2023-02-28 DIAGNOSIS — L97512 Non-pressure chronic ulcer of other part of right foot with fat layer exposed: Secondary | ICD-10-CM | POA: Diagnosis not present

## 2023-02-28 DIAGNOSIS — K219 Gastro-esophageal reflux disease without esophagitis: Secondary | ICD-10-CM | POA: Diagnosis not present

## 2023-02-28 DIAGNOSIS — Z01818 Encounter for other preprocedural examination: Secondary | ICD-10-CM | POA: Diagnosis not present

## 2023-02-28 NOTE — Progress Notes (Addendum)
[name], [name] ([id number]) [id number] Page 1 of 4 Visit Report for 02/24/2023 Arrival Information Details Patient Name: Date of Service: [name], [name] [name] 02/24/2023 9:30 A M Medical Record Number: [id number] Patient Account Number: [id number] Date of Birth/Sex: Treating RN: 11-23-1975 (47 y.o. [name] Primary Care Lorrinda Ramstad: Dorinda Hill Other Clinician: Referring Muneer Leider: Treating Seneca Gadbois/Extender: Jana Hakim in Treatment: 136 Visit Information History Since Last Visit Added or deleted any medications: No Patient Arrived: Wheel Chair Any new allergies or adverse reactions: No Arrival Time: 09:27 Had a fall or experienced change in No Accompanied By: family activities of daily living that may affect Transfer Assistance: None risk of falls: Patient Identification Verified: Yes Signs or symptoms of abuse/neglect since last visito No Secondary Verification Process Completed: Yes Hospitalized since last visit: No Patient Requires Transmission-Based Precautions: No Implantable device outside of the clinic excluding No Patient Has Alerts: No cellular tissue based products placed in the center since last visit: Has Dressing in Place as Prescribed: Yes Pain Present Now: Yes Electronic Signature(s) Signed: 02/27/2023 6:34:47 PM By: Zenaida Deed RN, BSN Entered By: Zenaida Deed on 02/27/2023 17:33:03 -------------------------------------------------------------------------------- Multi Wound Chart Details Patient Name: Date of Service: [name], [name] [place] [name] 02/24/2023 9:30 A M Medical Record Number: [id number] Patient Account Number: [id number] Date of Birth/Sex: Treating RN: 1976/03/13 (47 y.o. M) Primary Care Lyrick Worland: Dorinda Hill Other Clinician: Referring Enid Maultsby: Treating Natalija Mavis/Extender: Jana Hakim in Treatment: 136 Vital Signs Height(in): 69 Pulse(bpm): 80 Weight(lbs): 280 Blood  Pressure(mmHg): 122/83 Body Mass Index(BMI): 41.3 Temperature(F): 98.3 Respiratory Rate(breaths/min): 20 [2:Photos: No Photos Left Calcaneus Wound Location: Pressure Injury Wounding Event: Pressure Ulcer Primary Etiology: Asthma, Angina, Hypertension Comorbid History: 10/07/2019 Date Acquired: 136 Weeks of Treatment: Open Wound Status: No Wound Recurrence: 3.3x1.6x0.2  Measurements L x W x D (cm) 4.147 A (cm) : rea 0.829 Volume (cm) :] [N/A:N/A N/A N/A N/A N/A N/A N/A N/A N/A N/A N/A N/A] Bolinger, [name] ([id number]) [2:84.70% % Reduction in A rea: 96.90% % Reduction in Volume: Category/Stage III Classification: Medium Exudate A mount: Serosanguineous Exudate Type: red, brown Exudate Color: Distinct, outline attached Wound Margin: Large (67-100%) Granulation A mount:  Pink Granulation Quality: Small (1-33%) Necrotic A mount: Fat Layer (Subcutaneous Tissue): Yes N/A Exposed Structures: Fascia: No Tendon: No Muscle: No Joint: No Bone: No Small (1-33%) Epithelialization: Debridement - Selective/Open Wound N/A  Debridement: Pre-procedure Verification/Time Out 09:50 Taken: Callus, Slough Tissue Debrided: Non-Viable Tissue Level: 4.14 Debridement A (sq cm): rea Curette Instrument: Minimum Bleeding: Pressure Hemostasis A chieved: Procedure was tolerated well  Debridement Treatment Response: 3.3x1.6x0.2 Post Debridement Measurements L x W x D (cm) 0.829 Post Debridement Volume: (cm) Category/Stage III Post Debridement Stage: Scarring: Yes Periwound Skin Texture: Excoriation: No Induration: No Callus: No  Crepitus: No Rash: No Dry/Scaly: Yes Periwound Skin Moisture: Maceration: No Atrophie Blanche: No Periwound Skin Color: Cyanosis: No Ecchymosis: No Erythema: No Hemosiderin Staining: No Mottled: No Pallor: No Rubor: No No Abnormality Temperature: Yes  Tenderness on Palpation: Debridement Procedures Performed:] [N/A:N/A N/A N/A N/A N/A N/A N/A N/A N/A N/A N/A N/A N/A N/A N/A N/A N/A N/A N/A N/A N/A N/A N/A N/A N/A  N/A N/A N/A] Treatment Notes Electronic Signature(s) Signed: 03/07/2023 8:09:44 AM By: [name] MD FACS Entered By: [name] on 03/07/2023 08:09:44 -------------------------------------------------------------------------------- Pain Assessment Details Patient Name: Date of Service: [name], [name] [place] [name] 02/24/2023 9:30 A M Medical Record Number: [id number] Patient Account Number: [id number] Date of Birth/Sex:  Treating RN: 1976/07/18 (47 y.o. [name] Primary Care Aijah Lattner: Dorinda Hill Other Clinician: Referring Lailee Hoelzel: Treating Audley Hinojos/Extender: Jana Hakim in Treatment: 136 Active Problems Location of Pain Severity and Description of Pain Patient Has Paino Yes Site Locations Pain Location: [name], [name] ([id number]) [id number] Page 3 of 4 Pain Location: Pain in Ulcers With Dressing Change: Yes Duration of the Pain. Constant / Intermittento Constant Rate the pain. Current Pain Level: 6 Character of Pain Describe the Pain: Burning, Sharp Pain Management and Medication Current Pain Management: Medication: Yes Is the Current Pain Management Adequate: Adequate How does your wound impact your activities of daily livingo Sleep: No Bathing: No Appetite: No Relationship With Others: No Bladder Continence: No Emotions: No Bowel Continence: No Work: No Toileting: No Drive: No Dressing: No Hobbies: No Electronic Signature(s) Signed: 02/27/2023 6:34:47 PM By: Zenaida Deed RN, BSN Entered By: Zenaida Deed on 02/27/2023 17:34:04 -------------------------------------------------------------------------------- Wound Assessment Details Patient Name: Date of Service: [name], [name] [place] [name] 02/24/2023 9:30 A M Medical Record Number: [id number] Patient Account Number: [id number] Date of Birth/Sex: Treating RN: August 27, 1975 (47 y.o. [name] Primary Care Shamell Suarez: Dorinda Hill Other  Clinician: Referring Timotheus Salm: Treating Dameian Crisman/Extender: Jana Hakim in Treatment: 136 Wound Status Wound Number: 2 Primary Etiology: Pressure Ulcer Wound Location: Left Calcaneus Wound Status: Open Wounding Event: Pressure Injury Comorbid History: Asthma, Angina, Hypertension Date Acquired: 10/07/2019 Weeks Of Treatment: 136 Clustered Wound: No Wound Measurements Length: (cm) 3.3 Width: (cm) 1.6 Depth: (cm) 0.2 Area: (cm) 4.147 Volume: (cm) 0.829 % Reduction in Area: 84.7% % Reduction in Volume: 96.9% Epithelialization: Small (1-33%) Tunneling: No Undermining: No Wound Description Classification: Category/Stage III Wound Margin: Distinct, outline attached Exudate Amount: Medium [name], [name] ([id number]) Exudate Type: Serosanguineous Exudate Color: red, brown Foul Odor After Cleansing: No Slough/Fibrino No [id number] Page 4 of 4 Wound Bed Granulation Amount: Large (67-100%) Exposed Structure Granulation Quality: Pink Fascia Exposed: No Necrotic Amount: Small (1-33%) Fat Layer (Subcutaneous Tissue) Exposed: Yes Necrotic Quality: Adherent Slough Tendon Exposed: No Muscle Exposed: No Joint Exposed: No Bone Exposed: No Periwound Skin Texture Texture Color No Abnormalities Noted: No No Abnormalities Noted: No Callus: No Atrophie Blanche: No Crepitus: No Cyanosis: No Excoriation: No Ecchymosis: No Induration: No Erythema: No Rash: No Hemosiderin Staining: No Scarring: Yes Mottled: No Pallor: No Moisture Rubor: No No Abnormalities Noted: Yes Temperature / Pain Temperature: No Abnormality Tenderness on Palpation: Yes Electronic Signature(s) Signed: 02/27/2023 6:34:47 PM By: Zenaida Deed RN, BSN Entered By: Zenaida Deed on 02/27/2023 17:35:12 -------------------------------------------------------------------------------- Vitals Details Patient Name: Date of Service: [name], [name] [place] [name]  02/24/2023 9:30 A M Medical Record Number: [id number] Patient Account Number: [id number] Date of Birth/Sex: Treating RN: [birth year] (47 y.o. [name] Primary Care Cariah Salatino: Dorinda Hill Other Clinician: Referring Anyiah Coverdale: Treating Irl Bodie/Extender: Jana Hakim in Treatment: 136 Vital Signs Time Taken: 09:30 Temperature (F): 98.3 Height (in): 69 Pulse (bpm): 80 Weight (lbs): 280 Respiratory Rate (breaths/min): 20 Body Mass Index (BMI): 41.3 Blood Pressure (mmHg): 122/83 Reference Range: 80 - 120 mg / dl Electronic Signature(s) Signed: 02/27/2023 6:34:47 PM By: Zenaida Deed RN, BSN Entered By: Zenaida Deed on 02/27/2023 17:33:26

## 2023-03-01 ENCOUNTER — Ambulatory Visit (INDEPENDENT_AMBULATORY_CARE_PROVIDER_SITE_OTHER): Payer: PPO | Admitting: Behavioral Health

## 2023-03-01 ENCOUNTER — Encounter: Payer: Self-pay | Admitting: Behavioral Health

## 2023-03-01 DIAGNOSIS — F411 Generalized anxiety disorder: Secondary | ICD-10-CM

## 2023-03-01 DIAGNOSIS — F39 Unspecified mood [affective] disorder: Secondary | ICD-10-CM

## 2023-03-01 DIAGNOSIS — F331 Major depressive disorder, recurrent, moderate: Secondary | ICD-10-CM

## 2023-03-01 DIAGNOSIS — F99 Mental disorder, not otherwise specified: Secondary | ICD-10-CM

## 2023-03-01 DIAGNOSIS — F5105 Insomnia due to other mental disorder: Secondary | ICD-10-CM

## 2023-03-01 MED ORDER — DULOXETINE HCL 60 MG PO CPEP
ORAL_CAPSULE | ORAL | 1 refills | Status: DC
Start: 2023-03-01 — End: 2024-06-18

## 2023-03-01 MED ORDER — LAMOTRIGINE 100 MG PO TABS
100.0000 mg | ORAL_TABLET | Freq: Every day | ORAL | 1 refills | Status: DC
Start: 2023-03-01 — End: 2023-10-17

## 2023-03-01 MED ORDER — TRAZODONE HCL 100 MG PO TABS
200.0000 mg | ORAL_TABLET | Freq: Every day | ORAL | 3 refills | Status: DC
Start: 2023-03-01 — End: 2024-04-24

## 2023-03-01 NOTE — Progress Notes (Signed)
[name] [id number] Jul 23, 1976 47 y.o.  Virtual Visit via Telephone Note  I connected with pt on 03/01/23 at  2:30 PM EDT by telephone and verified that I am speaking with the correct person using two identifiers.   I discussed the limitations, risks, security and privacy concerns of performing an evaluation and management service by telephone and the availability of in person appointments. I also discussed with the patient that there may be a patient responsible charge related to this service. The patient expressed understanding and agreed to proceed.   I discussed the assessment and treatment plan with the patient. The patient was provided an opportunity to ask questions and all were answered. The patient agreed with the plan and demonstrated an understanding of the instructions.   The patient was advised to call back or seek an in-person evaluation if the symptoms worsen or if the condition fails to improve as anticipated.  I provided 30  minutes of non-face-to-face time during this encounter.  The patient was located at home.  The provider was located at [facility].   Joan Flores, NP   Subjective:   Patient ID:  [name] is a 47 y.o. (DOB March 14, 1976) male.  Chief Complaint:  Chief Complaint  Patient presents with   Anxiety   Depression   Follow-up   Medication Refill   Patient Education    HPI  47 year old male presents to this office via telephonic interview for follow up and medicaiton management. No changes since last visit. Says that his depression and anxiety have improved significantly. He has been trying to get more sunlight. Purchased a mood lamp. Says that he and wife have been getting along much better.  Says he has experience some recent depression and would like to stay on his medication for now.  He says his depression is 3/10 and anxiety is 2/10. He is sleeping 7-8 hours per night. Requesting no medication changes this visit. Denies mania,  no psychosis, no current SI. No HI.    Prior psychiatric medication trials: Auvelity- dry mouth    Review of Systems:  Review of Systems  Constitutional: Negative.   Allergic/Immunologic: Negative.   Neurological: Negative.   Psychiatric/Behavioral:  Positive for dysphoric mood.     Medications: I have reviewed the patient's current medications.  Current Outpatient Medications  Medication Sig Dispense Refill   albuterol (VENTOLIN HFA) 108 (90 Base) MCG/ACT inhaler TAKE 2 PUFFS BY MOUTH EVERY 4 HOURS AS NEEDED 18 each 1   Albuterol Sulfate 2.5 MG/0.5ML NEBU 0.5 mL.     amLODipine (NORVASC) 2.5 MG tablet Take 1 tablet (2.5 mg total) by mouth daily. 30 tablet 5   amLODipine (NORVASC) 5 MG tablet Take 5 mg by mouth daily.     budesonide-formoterol (SYMBICORT) 160-4.5 MCG/ACT inhaler Symbicort 160 mcg-4.5 mcg/actuation HFA aerosol inhaler INHALE TWO PUFFS TWICE DAILY TO PREVENT COUGH OR WHEEZE. RINSE, GARGLE, AND SPIT AFTER USE.     DULoxetine (CYMBALTA) 60 MG capsule 1 capsule Orally Once a day for 90 90 capsule 1   ergocalciferol (VITAMIN D2) 1.25 MG (50000 UT) capsule      fluocinolone (SYNALAR) 0.01 % external solution Apply topically 2 (two) times daily as needed. 60 mL 2   HYDROcodone-acetaminophen (NORCO) 5-325 MG tablet Take 1 tablet by mouth 2 (two) times daily as needed for moderate pain. 60 tablet 0   ipratropium (ATROVENT) 0.06 % nasal spray Place 2 sprays into both nostrils 3 (three) times daily. 15  mL 5   lamoTRIgine (LAMICTAL) 100 MG tablet Take 1 tablet (100 mg total) by mouth daily. 90 tablet 1   lisinopril (ZESTRIL) 10 MG tablet lisinopril 10 mg tablet TAKE 1 TABLET BY MOUTH EVERY DAY     montelukast (SINGULAIR) 10 MG tablet Take 1 tablet (10 mg total) by mouth daily. 90 tablet 1   oxyCODONE (ROXICODONE) 5 MG immediate release tablet Take 1 tablet (5 mg total) by mouth every 6 (six) hours as needed for severe pain (max 2 tabs/day for now). Do Not Fill Before 12/ 21/2023  (Patient not taking: Reported on 01/27/2023) 60 tablet 0   pantoprazole (PROTONIX) 40 MG tablet Take 1 tablet by mouth daily.     sildenafil (VIAGRA) 50 MG tablet 1 tablet as needed Orally Once a day     traZODone (DESYREL) 100 MG tablet Take 2 tablets (200 mg total) by mouth at bedtime. 180 tablet 3   triamcinolone (NASACORT) 55 MCG/ACT AERO nasal inhaler Place 2 sprays into the nose daily.     Vitamin D, Ergocalciferol, (DRISDOL) 1.25 MG (50000 UNIT) CAPS capsule TAKE 1 CAPSULE (50,000 UNITS TOTAL) BY MOUTH EVERY 7 (SEVEN) DAYS 5 capsule 0   No current facility-administered medications for this visit.    Medication Side Effects: None  Allergies: No Known Allergies  Past Medical History:  Diagnosis Date   Anginal pain (HCC)    with covid   Anxiety    Asthma    Depression    Dyspnea    GERD (gastroesophageal reflux disease)    Headache    History of kidney stones    LEFT URETERAL STONE   HTN (hypertension)    Pancreatitis 2018   GALLBALDDER SLUDGE CAUSED ISSUED RESOLVED   Pneumonia 07/2019   covid   Prediabetes    SOBOE (shortness of breath on exertion)     Family History  Problem Relation Age of Onset   Migraines Mother    GER disease Mother    Heart disease Father    Hyperlipidemia Father    Diabetes Father    Hypertension Father    Sleep apnea Father    Obesity Father    Asthma Brother    Pancreatic cancer Maternal Grandmother    Heart disease Maternal Grandfather    Diabetes Paternal Grandmother    Hypertension Paternal Grandmother     Social History   Socioeconomic History   Marital status: Married    Spouse name: Not on file   Number of children: 1   Years of education: Not on file   Highest education level: Not on file  Occupational History   Occupation: delivery driver   Occupation: Stay at home spouse  Tobacco Use   Smoking status: Never   Smokeless tobacco: Never  Vaping Use   Vaping status: Never Used  Substance and Sexual Activity    Alcohol use: Yes    Comment: rarely 2-3 times a year   Drug use: No   Sexual activity: Yes    Partners: Female  Other Topics Concern   Not on file  Social History Narrative   Not on file   Social Determinants of Health   Financial Resource Strain: Not on file  Food Insecurity: Not on file  Transportation Needs: Not on file  Physical Activity: Not on file  Stress: Not on file  Social Connections: Not on file  Intimate Partner Violence: Not on file    Past Medical History, Surgical history, Social history, and Family history were  reviewed and updated as appropriate.   Please see review of systems for further details on the patient's review from today.   Objective:   Physical Exam:  There were no vitals taken for this visit.  Physical Exam Constitutional:      General: He is not in acute distress.    Appearance: Normal appearance.  Neurological:     Mental Status: He is alert and oriented to person, place, and time.     Gait: Gait normal.  Psychiatric:        Attention and Perception: Attention and perception normal. He does not perceive auditory or visual hallucinations.        Mood and Affect: Mood and affect normal. Mood is not anxious or depressed. Affect is not labile.        Speech: Speech normal.        Behavior: Behavior normal. Behavior is cooperative.        Thought Content: Thought content normal.        Cognition and Memory: Cognition and memory normal.        Judgment: Judgment normal.     Lab Review:     Component Value Date/Time   NA 145 (H) 09/21/2022 1243   K 4.3 09/21/2022 1243   CL 102 09/21/2022 1243   CO2 22 09/21/2022 1243   GLUCOSE 99 09/21/2022 1243   GLUCOSE 129 (H) 08/31/2020 0000   BUN 17 09/21/2022 1243   CREATININE 0.94 09/21/2022 1243   CREATININE 1.10 08/31/2020 0000   CALCIUM 9.1 09/21/2022 1243   PROT 6.7 09/21/2022 1243   ALBUMIN 4.0 (L) 09/21/2022 1243   AST 21 09/21/2022 1243   ALT 23 09/21/2022 1243   ALKPHOS 91  09/21/2022 1243   BILITOT 0.4 09/21/2022 1243   GFRNONAA 80 08/31/2020 0000   GFRAA 93 08/31/2020 0000       Component Value Date/Time   WBC 6.6 09/21/2022 1243   WBC 7.7 08/31/2020 0000   RBC 6.06 (H) 09/21/2022 1243   RBC 5.66 08/31/2020 0000   HGB 16.0 09/21/2022 1243   HCT 51.0 09/21/2022 1243   PLT 132 (L) 09/21/2022 1243   MCV 84 09/21/2022 1243   MCH 26.4 (L) 09/21/2022 1243   MCH 28.3 08/31/2020 0000   MCHC 31.4 (L) 09/21/2022 1243   MCHC 33.8 08/31/2020 0000   RDW 12.7 09/21/2022 1243   LYMPHSABS 2.0 09/21/2022 1243   MONOABS 0.7 12/02/2019 0628   EOSABS 0.1 09/21/2022 1243   BASOSABS 0.1 09/21/2022 1243    No results found for: "POCLITH", "LITHIUM"   No results found for: "PHENYTOIN", "PHENOBARB", "VALPROATE", "CBMZ"   .res Assessment: Plan:   Greater than 50% of 30 min telephonic visit with patient was spent on counseling and coordination of care.  We discussed patients chronic health concerns and his continue wound care for feet. Reporting his anxiety and depression have improved overall. He does not want to change his medication regimen for now. Reconciled medications.  We agreed to: Continue  Cymbalta to 60 mg daily.  Continue Trazodone 200 mg at bedtime Continue Lamictal to 100 mg daily, patients request.  Will report any side effects or worsening symptoms To follow up in 3 months  to reassess Provided emergency contact information Reviewed PDMP          Anan was seen today for anxiety, depression, follow-up, medication refill and patient education.  Diagnoses and all orders for this visit:  Major depressive disorder, recurrent episode, moderate (HCC) -  DULoxetine (CYMBALTA) 60 MG capsule; 1 capsule Orally Once a day for 90 -     lamoTRIgine (LAMICTAL) 100 MG tablet; Take 1 tablet (100 mg total) by mouth daily.  Generalized anxiety disorder -     DULoxetine (CYMBALTA) 60 MG capsule; 1 capsule Orally Once a day for 90 -     lamoTRIgine  (LAMICTAL) 100 MG tablet; Take 1 tablet (100 mg total) by mouth daily.  Unspecified mood (affective) disorder (HCC) -     DULoxetine (CYMBALTA) 60 MG capsule; 1 capsule Orally Once a day for 90 -     lamoTRIgine (LAMICTAL) 100 MG tablet; Take 1 tablet (100 mg total) by mouth daily.  Insomnia due to other mental disorder -     traZODone (DESYREL) 100 MG tablet; Take 2 tablets (200 mg total) by mouth at bedtime.    Please see After Visit Summary for patient specific instructions.  Future Appointments  Date Time Provider Department Center  03/03/2023 10:00 AM [name], MD [facility]  03/07/2023  9:15 AM Beulah Gandy, RD [facility]  03/21/2023  9:00 AM Sheets, Ivin Booty, LCSW [facility] None  05/02/2023  3:00 PM Alfonse Spruce, MD [facility] None  05/08/2023  9:40 AM Genice Rouge, MD [facility]  06/01/2023  2:30 PM Joan Flores, NP [facility] None    No orders of the defined types were placed in this encounter.     -------------------------------

## 2023-03-03 ENCOUNTER — Encounter (HOSPITAL_BASED_OUTPATIENT_CLINIC_OR_DEPARTMENT_OTHER): Payer: PPO | Admitting: General Surgery

## 2023-03-03 DIAGNOSIS — L97522 Non-pressure chronic ulcer of other part of left foot with fat layer exposed: Secondary | ICD-10-CM | POA: Diagnosis not present

## 2023-03-03 DIAGNOSIS — L89623 Pressure ulcer of left heel, stage 3: Secondary | ICD-10-CM | POA: Diagnosis not present

## 2023-03-03 NOTE — Progress Notes (Signed)
CASSEL, JARECKI ([id number]) [phone].pdf Page 1 of 8 Visit Report for 03/03/2023 Arrival Information Details Patient Name: Date of Service: [name], TO [name] 03/03/2023 10:00 A M Medical Record Number: [id number] Patient Account Number: [id number] Date of Birth/Sex: Treating RN: 09-28-1975 (47 y.o. [name] Primary Care Kayl [name]: Dorinda Hill Other Clinician: Referring Liberato Stansbery: Treating Kirsten Spearing/Extender: Jana Hakim in Treatment: 137 Visit Information History Since Last Visit All ordered tests and consults were completed: Yes Patient Arrived: Wheel Chair Added or deleted any medications: No Arrival Time: 10:01 Any new allergies or adverse reactions: No Accompanied By: son Had a fall or experienced change in No Transfer Assistance: EasyPivot Patient Lift activities of daily living that may affect Patient Identification Verified: Yes risk of falls: Secondary Verification Process Completed: Yes Signs or symptoms of abuse/neglect since last visito No Patient Requires Transmission-Based Precautions: No Hospitalized since last visit: No Patient Has Alerts: No Implantable device outside of the clinic excluding No cellular tissue based products placed in the center since last visit: Has Dressing in Place as Prescribed: Yes Has Compression in Place as Prescribed: Yes Pain Present Now: Yes Electronic Signature(s) Signed: 03/03/2023 12:49:07 PM By: Brenton Grills Entered By: Brenton Grills on 03/03/2023 10:04:54 -------------------------------------------------------------------------------- Encounter Discharge Information Details Patient Name: Date of Service: [name], [name] 03/03/2023 10:00 A M Medical Record Number: [id number] Patient Account Number: [id number] Date of Birth/Sex: Treating RN: [birth year] (47 y.o. [name] Primary Care Lurena Naeve: Dorinda Hill Other Clinician: Referring [name] Campos: Treating  Storey Stangeland/Extender: Jana Hakim in Treatment: [phone] Encounter Discharge Information Items Post Procedure Vitals Discharge Condition: Stable Temperature (F): 97.7 Ambulatory Status: Wheelchair Pulse (bpm): 68 Discharge Destination: Home Respiratory Rate (breaths/min): 18 Transportation: Private Auto Blood Pressure (mmHg): 129/70 Accompanied By: son Schedule Follow-up Appointment: Yes Clinical Summary of Care: Patient Declined Electronic Signature(s) Signed: 03/03/2023 12:49:07 PM By: Brenton Grills Entered By: Brenton Grills on 03/03/2023 10:37:14 [name] ([id number]) [id number] Page 2 of 8 -------------------------------------------------------------------------------- Lower Extremity Assessment Details Patient Name: Date of Service: [name] TO [name] 03/03/2023 10:00 A M Medical Record Number: [id number] Patient Account Number: [id number] Date of Birth/Sex: Treating RN: 11-30-1975 (47 y.o. [name] Primary Care Mykale Gandolfo: Dorinda Hill Other Clinician: Referring Solina Heron: Treating Uriyah Raska/Extender: Jana Hakim in Treatment: 137 Edema Assessment Assessed: [name]: No] Franne Forts: No] Edema: [Left: N] [Right: o] Calf Left: Right: Point of Measurement: 29 cm From Medial Instep 42.8 cm Ankle Left: Right: Point of Measurement: 9 cm From Medial Instep 24.7 cm Vascular Assessment Pulses: Dorsalis Pedis Palpable: [Left:Yes] Extremity colors, hair growth, and conditions: Extremity Color: [Left:Normal] Hair Growth on Extremity: [Left:Yes] Temperature of Extremity: [Left:Warm] Capillary Refill: [Left:< 3 seconds] Dependent Rubor: [Left:No] Blanched when Elevated: [Left:No No] Toe Nail Assessment Left: Right: Thick: No Discolored: No Deformed: No Improper Length and Hygiene: No Electronic Signature(s) Signed: 03/03/2023 12:49:07 PM By: Brenton Grills Entered By: Brenton Grills on 03/03/2023  10:06:05 -------------------------------------------------------------------------------- Multi Wound Chart Details Patient Name: Date of Service: [name], [name] 03/03/2023 10:00 A M Medical Record Number: [id number] Patient Account Number: [id number] Date of Birth/Sex: Treating RN: 16-Nov-1975 (47 y.o. M) Primary Care Lexia Vandevender: Dorinda Hill Other Clinician: Referring Bartley Vuolo: Treating Malaak Stach/Extender: Jana Hakim in Treatment: 137 Vital Signs Height(in): 69 Pulse(bpm): 87 Weight(lbs): 280 Blood Pressure(mmHg): 126/84 Body Mass Index(BMI): 41.3 Temperature(F): 98.2 KEMPER, HUMM ([id number]) [phone].pdf Page 3 of 8 Respiratory Rate(breaths/min): 18 [2:Photos:] [N/A:N/A] Left Calcaneus N/A N/A Wound Location:  Pressure Injury N/A N/A Wounding Event: Pressure Ulcer N/A N/A Primary Etiology: Asthma, Angina, Hypertension N/A N/A Comorbid History: 10/07/2019 N/A N/A Date Acquired: 137 N/A N/A Weeks of Treatment: Open N/A N/A Wound Status: No N/A N/A Wound Recurrence: 6.5x2.5x0.2 N/A N/A Measurements L x W x D (cm) 12.763 N/A N/A A (cm) : rea 2.553 N/A N/A Volume (cm) : 52.90% N/A N/A % Reduction in A rea: 90.60% N/A N/A % Reduction in Volume: Category/Stage III N/A N/A Classification: Medium N/A N/A Exudate A mount: Serosanguineous N/A N/A Exudate Type: red, brown N/A N/A Exudate Color: Distinct, outline attached N/A N/A Wound Margin: Large (67-100%) N/A N/A Granulation A mount: Pink N/A N/A Granulation Quality: Small (1-33%) N/A N/A Necrotic A mount: Fat Layer (Subcutaneous Tissue): Yes N/A N/A Exposed Structures: Fascia: No Tendon: No Muscle: No Joint: No Bone: No Small (1-33%) N/A N/A Epithelialization: Debridement - Selective/Open Wound N/A N/A Debridement: Pre-procedure Verification/Time Out 10:20 N/A N/A Taken: Lidocaine 4% Topical Solution N/A N/A Pain Control: Necrotic/Eschar, Slough  N/A N/A Tissue Debrided: Non-Viable Tissue N/A N/A Level: 12.76 N/A N/A Debridement A (sq cm): rea Curette N/A N/A Instrument: Minimum N/A N/A Bleeding: Pressure N/A N/A Hemostasis A chieved: 0 N/A N/A Procedural Pain: 0 N/A N/A Post Procedural Pain: Procedure was tolerated well N/A N/A Debridement Treatment Response: 6.5x2.5x0.2 N/A N/A Post Debridement Measurements L x W x D (cm) 2.553 N/A N/A Post Debridement Volume: (cm) Category/Stage III N/A N/A Post Debridement Stage: Scarring: Yes N/A N/A Periwound Skin Texture: Excoriation: No Induration: No Callus: No Crepitus: No Rash: No Dry/Scaly: Yes N/A N/A Periwound Skin Moisture: Maceration: No Atrophie Blanche: No N/A N/A Periwound Skin Color: Cyanosis: No Ecchymosis: No Erythema: No Hemosiderin Staining: No Mottled: No Pallor: No Rubor: No No Abnormality N/A N/A Temperature: Yes N/A N/A Tenderness on Palpation: Debridement N/A N/A Procedures Performed: Treatment Notes Electronic Signature(sRICHAD, DEHARO ([id number]) [id number] Page 4 of 8 Signed: 03/03/2023 10:34:02 AM By: [name] MD FACS Entered By: [name] on 03/03/2023 10:34:01 -------------------------------------------------------------------------------- Multi-Disciplinary Care Plan Details Patient Name: Date of Service: [name], TO [name] 03/03/2023 10:00 A M Medical Record Number: [id number] Patient Account Number: [id number] Date of Birth/Sex: Treating RN: 1976/04/09 (47 y.o. [name] Primary Care Sebastyan Snodgrass: Dorinda Hill Other Clinician: Referring Ryott Rafferty: Treating Brandalyn Harting/Extender: Jana Hakim in Treatment: 137 Multidisciplinary Care Plan reviewed with physician Active Inactive Wound/Skin Impairment Nursing Diagnoses: Impaired tissue integrity Knowledge deficit related to ulceration/compromised skin integrity Goals: Patient/caregiver will verbalize  understanding of skin care regimen Date Initiated: 07/15/2020 Target Resolution Date: 05/08/2023 Goal Status: Active Ulcer/skin breakdown will have a volume reduction of 30% by week 4 Date Initiated: 07/15/2020 Date Inactivated: 08/20/2020 Target Resolution Date: 09/03/2020 Goal Status: Unmet Unmet Reason: no major changes. Ulcer/skin breakdown will heal within 14 weeks Date Initiated: 12/04/2020 Date Inactivated: 12/10/2020 Target Resolution Date: 12/10/2020 Unmet Reason: wounds still open at 14 Goal Status: Unmet weeks and today 21 weeks. Interventions: Assess patient/caregiver ability to obtain necessary supplies Assess patient/caregiver ability to perform ulcer/skin care regimen upon admission and as needed Assess ulceration(s) every visit Provide education on ulcer and skin care Treatment Activities: Skin care regimen initiated : 07/15/2020 Topical wound management initiated : 07/15/2020 Notes: Electronic Signature(s) Signed: 03/03/2023 12:49:07 PM By: Brenton Grills Entered By: Brenton Grills on 03/03/2023 10:17:36 -------------------------------------------------------------------------------- Pain Assessment Details Patient Name: Date of Service: [name], [name] 03/03/2023 10:00 A M Medical Record Number: [id number] Patient Account Number: [id number] Date of Birth/Sex: Treating  RN: 04/17/1976 (47 y.o. [name] Primary Care Kit Brubacher: Dorinda Hill Other Clinician: Referring Cortlin Marano: Treating Ryver Zadrozny/Extender: Jana Hakim in Treatment: [address] ([id number]) [id number] Page 5 of 8 Active Problems Location of Pain Severity and Description of Pain Patient Has Paino Yes Site Locations Pain Location: Generalized Pain Pain Management and Medication Current Pain Management: Electronic Signature(s) Signed: 03/03/2023 12:49:07 PM By: Brenton Grills Entered By: Brenton Grills on 03/03/2023  10:05:36 -------------------------------------------------------------------------------- Patient/Caregiver Education Details Patient Name: Date of Service: GO UR[place], [name] 7/26/2024andnbsp10:00 A M Medical Record Number: [id number] Patient Account Number: [id number] Date of Birth/Gender: Treating RN: 03-20-1976 (47 y.o. [name] Primary Care Physician: Dorinda Hill Other Clinician: Referring Physician: Treating Physician/Extender: Jana Hakim in Treatment: 137 Education Assessment Education Provided To: Patient and Caregiver Education Topics Provided Wound/Skin Impairment: Methods: Explain/Verbal Responses: State content correctly Electronic Signature(s) Signed: 03/03/2023 12:49:07 PM By: Brenton Grills Entered By: Brenton Grills on 03/03/2023 10:24:24 [name] ([id number]) [id number] Page 6 of 8 -------------------------------------------------------------------------------- Wound Assessment Details Patient Name: Date of Service: [name], TO [name] 03/03/2023 10:00 A M Medical Record Number: [id number] Patient Account Number: [id number] Date of Birth/Sex: Treating RN: [birth year] (47 y.o. [name] Primary Care Aarya Robinson: Dorinda Hill Other Clinician: Referring Katessa Attridge: Treating Carmela Piechowski/Extender: Jana Hakim in Treatment: 137 Wound Status Wound Number: 2 Primary Etiology: Pressure Ulcer Wound Location: Left Calcaneus Wound Status: Open Wounding Event: Pressure Injury Comorbid History: Asthma, Angina, Hypertension Date Acquired: 10/07/2019 Weeks Of Treatment: 137 Clustered Wound: No Photos Wound Measurements Length: (cm) 6.5 Width: (cm) 2.5 Depth: (cm) 0.2 Area: (cm) 12.763 Volume: (cm) 2.553 % Reduction in Area: 52.9% % Reduction in Volume: 90.6% Epithelialization: Small (1-33%) Tunneling: No Undermining: No Wound Description Classification: Category/Stage III Wound  Margin: Distinct, outline attached Exudate Amount: Medium Exudate Type: Serosanguineous Exudate Color: red, brown Foul Odor After Cleansing: No Slough/Fibrino No Wound Bed Granulation Amount: Large (67-100%) Exposed Structure Granulation Quality: Pink Fascia Exposed: No Necrotic Amount: Small (1-33%) Fat Layer (Subcutaneous Tissue) Exposed: Yes Necrotic Quality: Adherent Slough Tendon Exposed: No Muscle Exposed: No Joint Exposed: No Bone Exposed: No Periwound Skin Texture Texture Color No Abnormalities Noted: No No Abnormalities Noted: No Callus: No Atrophie Blanche: No Crepitus: No Cyanosis: No Excoriation: No Ecchymosis: No Induration: No Erythema: No Rash: No Hemosiderin Staining: No Scarring: Yes Mottled: No Pallor: No Moisture Rubor: No No Abnormalities Noted: Yes Temperature / Pain Temperature: No Abnormality Tenderness on Palpation: Yes Treatment Notes Wound #2 (Calcaneus) Wound Laterality: Left SONYA, GIM ([id number]) [phone].pdf Page 7 of 8 Cleanser Normal Saline Discharge Instruction: Cleanse the wound with Normal Saline prior to applying a clean dressing using gauze sponges, not tissue or cotton balls. Wound Cleanser Discharge Instruction: Cleanse the wound with wound cleanser prior to applying a clean dressing using gauze sponges, not tissue or cotton balls. Peri-Wound Care Ketoconazole Cream 2% Discharge Instruction: Apply Ketoconazole mixed with zinc to the periwound Zinc Oxide Ointment 30g tube Discharge Instruction: Apply Zinc Oxide to periwound with each dressing change Topical Gentamicin Discharge Instruction: apply to the wound bed Primary Dressing Maxorb Extra Ag+ Alginate Dressing, 4x4.75 (in/in) Discharge Instruction: Apply to wound bed as instructed Secondary Dressing Drawtex 4x4 in Discharge Instruction: Apply over primary dressing as directed. Optifoam Non-Adhesive Dressing, 4x4 in Discharge  Instruction: Apply over primary dressing as directed. Woven Gauze Sponge, Non-Sterile 4x4 in Discharge Instruction: Apply over primary dressing as directed. Secured With [employer]   Cloth Surgical T ape, 4 x 10 (in/yd) Discharge Instruction: Secure with tape as directed. Compression Wrap Kerlix Roll 4.5x3.1 (in/yd) Discharge Instruction: Apply Kerlix and Coban compression as directed. Compression Stockings Add-Ons [place] [employer] Offloading Boot, Size Regular Electronic Signature(s) Signed: 03/03/2023 12:49:07 PM By: Brenton Grills Entered By: Brenton Grills on 03/03/2023 10:16:32 -------------------------------------------------------------------------------- Vitals Details Patient Name: Date of Service: [name], [name] 03/03/2023 10:00 A M Medical Record Number: [id number] Patient Account Number: [id number] Date of Birth/Sex: Treating RN: 05/22/1976 (47 y.o. [name] Primary Care Kamrie Fanton: Dorinda Hill Other Clinician: Referring Carey Lafon: Treating Johann Santone/Extender: Jana Hakim in Treatment: 137 Vital Signs Time Taken: 10:04 Temperature (F): 98.2 Height (in): 69 Pulse (bpm): 87 Weight (lbs): 280 Respiratory Rate (breaths/min): 18 Body Mass Index (BMI): 41.3 Blood Pressure (mmHg): 126/84 Reference Range: 80 - 120 mg / dl Electronic Signature(s) Signed: 03/03/2023 12:49:07 PM By: Lorretta Harp, Henrietta Dine ([id number]) [id number] Page 8 of 8 Entered By: Brenton Grills on 03/03/2023 10:05:25

## 2023-03-03 NOTE — Progress Notes (Signed)
[name], [name] ([id number]) [id number] Page 1 of 17 Visit Report for 03/03/2023 Chief Complaint Document Details Patient Name: Date of Service: [name], TO [name] 03/03/2023 10:00 A M Medical Record Number: [id number] Patient Account Number: [id number] Date of Birth/Sex: Treating RN: 06-25-1976 (47 y.o. M) Primary Care Provider: Dorinda Hill Other Clinician: Referring Provider: Treating Provider/Extender: Jana Hakim in Treatment: 137 Information Obtained from: Patient Chief Complaint Bilateral Plantar Foot Ulcers Electronic Signature(s) Signed: 03/03/2023 10:34:35 AM By: [name] MD FACS Entered By: [name] on 03/03/2023 10:34:35 -------------------------------------------------------------------------------- Debridement Details Patient Name: Date of Service: [name], [name] 03/03/2023 10:00 A M Medical Record Number: [id number] Patient Account Number: [id number] Date of Birth/Sex: Treating RN: 01/14/1976 (47 y.o. [name] Primary Care Provider: Dorinda Hill Other Clinician: Referring Provider: Treating Provider/Extender: Jana Hakim in Treatment: 137 Debridement Performed for Assessment: Wound #2 Left Calcaneus Performed By: Physician [name], MD Debridement Type: Debridement Level of Consciousness (Pre-procedure): Awake and Alert Pre-procedure Verification/Time Out Yes - 10:20 Taken: Start Time: 10:22 Pain Control: Lidocaine 4% T opical Solution Percent of Wound Bed Debrided: 100% T Area Debrided (cm): otal 12.76 Tissue and other material debrided: Viable, Non-Viable, Eschar, Slough, Slough Level: Non-Viable Tissue Debridement Description: Selective/Open Wound Instrument: Curette Bleeding: Minimum Hemostasis Achieved: Pressure End Time: 10:25 Procedural Pain: 0 Post Procedural Pain: 0 Response to Treatment: Procedure was tolerated well Level of Consciousness  (Post- Awake and Alert procedure): Post Debridement Measurements of Total Wound Length: (cm) 6.5 Stage: Category/Stage III Width: (cm) 2.5 Depth: (cm) 0.2 Volume: (cm) 2.553 Character of Wound/Ulcer Post DebridementJAMEER, [name] ([id number]) [id number] Page 2 of 17 Post Procedure Diagnosis Same as Pre-procedure Notes Scribed for Dr Lady [name] by Brenton Grills RN Electronic Signature(s) Signed: 03/03/2023 10:46:22 AM By: [name] MD FACS Signed: 03/03/2023 12:49:07 PM By: Brenton Grills Entered By: Brenton Grills on 03/03/2023 10:24:04 -------------------------------------------------------------------------------- HPI Details Patient Name: Date of Service: [name], [name] 03/03/2023 10:00 A M Medical Record Number: [id number] Patient Account Number: [id number] Date of Birth/Sex: Treating RN: 1975-09-25 (47 y.o. M) Primary Care Provider: Dorinda Hill Other Clinician: Referring Provider: Treating Provider/Extender: Jana Hakim in Treatment: 137 History of Present Illness HPI Description: Wounds are12/03/2020 upon evaluation today patient presents for initial inspection here in our clinic concerning issues he has been having with the bottoms of his feet bilaterally. He states these actually occurred as wounds when he was hospitalized for 5 months secondary to Covid. He was apparently with tilting bed where he was in an upright position quite frequently and apparently this occurred in some way shape or form during that time. Fortunately there is no sign of active infection at this time. No fevers, chills, nausea, vomiting, or diarrhea. With that being said he still has substantial wounds on the plantar aspects of his feet Theragen require quite a bit of work to get these to heal. He has been using Santyl currently though that is been problematic both in receiving the medication as well as actually paid for it as it is  become quite expensive. Prior to the experience with Covid the patient really did not have any major medical problems other than hypertension he does have some mild generalized weakness following the Covid experience. 07/22/2020 on evaluation today patient appears to be doing okay in regard to his foot ulcers I feel like the wound beds are showing signs of better improvement that I do believe the Iodoflex  is helping in this regard. With that being said he does have a lot of drainage currently and this is somewhat blue/green in nature which is consistent with Pseudomonas. I do think a culture today would be appropriate for [place] to evaluate and see if that is indeed the case I would likely start him on antibiotic orally as well he is not allergic to Cipro knows of no issues he has had in the past 12/21; patient was admitted to the clinic earlier this month with bilateral presumed pressure ulcers on the bottom of his feet apparently related to excessive pressure from a tilt table arrangement in the intensive care unit. Patient relates this to being on ECMO but I am not really sure that is exactly related to that. I must say I have never seen anything like this. He has fairly extensive full-thickness wounds extending from his heel towards his midfoot mostly centered laterally. There is already been some healing distally. He does not appear to have an arterial issue. He has been using gentamicin to the wound surfaces with Iodoflex to help with ongoing debridement 1/6; this is a patient with pressure ulcers on the bottom of his feet related to excessive pressure from a standing position in the intensive care unit. He is complaining of a lot of pain in the right heel. He is not a diabetic. 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 We have been using Iodoflex to help prepare the surfaces of both wounds for an advanced treatment product. He is nonambulatory spending most of his time in a  wheelchair I have asked him not to propel the wheelchair with his heels 1/13; in general his wounds look better not much surface area change we have been using Iodoflex as of last week. I did an x-ray of the right heel as the patient was complaining of pain. I had some thoughts about a stress fracture perhaps Achilles tendon problems however what it showed was erosive changes along the inferior aspect of the calcaneus he now has a MRI booked for 1/20. 1/20; in general his wounds continue to be better. Some improvement in the large narrow areas proximally in his foot. He is still complaining of pain in the right heel and tenderness in certain areas of this wound. His MRI is tonight. I am not just looking for osteomyelitis that was brought up on the x-ray I am wondering about stress fractures, tendon ruptures etc. He has no such findings on the left. Also noteworthy is that the patient had critical illness neuropathy and some of the discomfort may be actual improvement in nerve function I am just not sure. These wounds were initially in the setting of severe critical illness related to COVID-19. He was put in a standing position. He may have also been on pressors at the point contributing to tissue ischemia. By his description at some point these wounds were grossly necrotic extending proximally up into the Achilles part of his heel. I do not know that I have ever really seen pictures of them like this although they may exist in epic We have ordered Tri layer Oasis. I am trying to stimulate some granulation in these areas. This is of course assuming the MRI is negative for infection 1/27; since the patient was last here he saw [name] of infectious disease. He is planned for vancomycin and ceftriaxone. Prior operative culture grew MSSA. Also ordered baseline lab work. He also ordered arterial studies although the ABIs in our  clinic were normal as well as his clinical exam these were normal I do not  think he needs to see vascular surgery. His ABIs at the [facility] were 1.22 in the right triphasic waveforms with a normal TBI of 1.15 on the left ABI of 1.22 with triphasic waveforms and a normal TBI of 1.08. Finally he saw [name] who will follow him in for 2 months. At this point I do not think he felt that he needed a procedure on the right calcaneal bone. [name] is elected for broad-spectrum antibiotic The patient is still having pain in the right heel. He walks with a walker 2/3; wounds are generally smaller. He is tolerating his IV antibiotics. I believe this is vancomycin and ceftriaxone. We are still waiting for Oasis burn in terms of his out-of-pocket max which he should be meeting soon given the IV antibiotics, MRIs etc. I have asked him to check in on this. We are using silver collagen in the meantime the wounds look better RANGEL, CANNADA ([id number]) [phone].pdf Page 3 of 17 2/10; tolerating IV vancomycin and Rocephin. We are waiting to apply for Oasis. Although I am not really sure where he is in his out-of-pocket max. 2/17 started the first application of Oasis trilayer. Still on antibiotics. The wounds have generally look better. The area on the left has a little more surface slough requiring debridement 2/24; second application of Oasis trilayer. The wound surface granulation is generally look better. The area on the left with undermining laterally I think is come in a bit. 10/08/2020 upon evaluation today patient is here today for [employer] application #3. Fortunately he seems to be doing extremely well with regard to this and we are seeing a lot of new epithelial growth which is great news. Fortunately there is no signs of active infection at this time. 10/16/2020 upon evaluation today patient appears to be doing well with regard to his foot ulcers. Do believe the Oasis has been of benefit for him. I do not see any signs of infection right now which  is great news and I think that he has a lot of new epithelial growth which is great to see as well. The patient is very pleased to hear all of this. I do think we can proceed with the Oasis trilayer #4 today. 3/18; not as much improvement in these areas on his heels that I was hoping. I did reapply trilateral Oasis today the tissue looks healthier but not as much fill in as I was hoping. 3/25; better looking today I think this is come in a bit the tissue looks healthier. Triple layer Oasis reapplied #6 4/1; somewhat better looking definitely better looking surface not as much change in surface area as I was hoping. He may be spending more time [place] on days then he needs to although he does have heel offloading boots. Triple layer Oasis reapplied #7 4/7; unfortunately apparently [facility] will not approve any further Oasis which is unfortunate since the patient did respond nicely both in terms of the condition of the wound bed as well as surface area. There is still some drainage coming from the wound but not a lot there does not appear to be any infection 4/15; we have been using Hydrofera Blue. He continues to have nice rims of epithelialization on the right greater than the left. The left the epithelialization is coming from the tip of his heel. There is moderate drainage. In this that concerns  me about a total contact cast. There is no evidence of infection 4/29; patient has been using Hydrofera Blue with dressing changes. He has no complaints or issues today. 5/5; using Hydrofera Blue. I actually think that he looks marginally better than the last time I saw this 3 weeks ago. There are rims of epithelialization on the left thumb coming from the medial side on the right. Using Hydrofera Blue 5/12; using Hydrofera Blue. These continue to make improvements in surface area. His drainage was not listed as severe I therefore went ahead and put a cast on the left foot. Right foot we will  continue to dress his previous 5/16; back for first total contact cast change. He did not tolerate this particularly well cast injury on the anterior tibia among other issues. Difficulty sleeping. I talked him about this in some detail and afterwards is elected to continue. I told him I would like to have a cast on for 3 weeks to see if this is going to help at all. I think he agreed 5/19; I think the wound is better. There is no tunneling towards his midfoot. The undermining medially also looks better. He has a rim of new skin distally. I think we are making progress here. The area on the left also continues to look somewhat better to me using Hydrofera Blue. He has a list of complaints about the cast but none of them seem serious 5/26; patient presents for 1 week follow-up. He has been using a total contact cast and tolerating this well. Hydrofera Blue is the main dressing used. He denies signs of infection. 6/2 Hydrofera Blue total contact cast on the left. These were large ulcers that formed in intensive care unit where the patient was recovering from COVID. May have had something to do with being ventilated in an upright positiono Pressors etc. We have been able to get the areas down considerably and a viable surface. There is some epithelialization in both sides. Note made of drainage 6/9; changed to [facility] last time because of drainage. He arrives with better looking surfaces and dimensions on the left than the right. Paradoxically the right actually probes more towards his midfoot the left is largely close down but both of these look improved. Using a total contact cast on the left 6/16; complex wounds on his bilateral plantar heels which were initially pressure injury from a stay in the ICU with COVID. We have been using silver alginate most recently. His dimensions of come in quite dramatically however not recently. We have been putting the left foot in a total contact cast 6/23;  complex wounds on the bilateral plantar heels. I been putting the left in the cast paradoxically the area on the right is the one that is going towards closure at a faster rate. Quite a bit of drainage on the left. The patient went to see [name] who said he was going to standby for skin grafts. I had actually considered sending him for skin grafts however he would be mandatorily off his feet for a period of weeks to months. I am thinking that the area on the right is going to close on its own the area on the left has been more stubborn even though we have him in a total contact cast 6/30; took him out of a total contact cast last week is the right heel seem to be making better progress than the left where I was placing the cast. We are using silver  alginate. Both wounds are smaller right greater than left 7/12; both wounds look as though they are making some progress. We are using silver alginate. Heel offloading boots 7/26; very gradual progress especially on the right. Using silver alginate. He is wearing heel offloading boots 8/18; he continues to close these wounds down very gradually. Using silver alginate. The problem polymen being definitive about this is areas of what appears to be callus around the margins. This is not a 100% of the area but certainly sizable especially on the right 9/1; bilateral plantar feet wounds secondary to prolonged pressure while being ventilated for COVID-19 in an upright position. Essentially pressure ulcers on the bottom of his feet. He is made substantial progress using silver alginate. 9/14; bilateral plantar feet wounds secondary to prolonged pressure. Making progress using silver alginate. 9/29 bilateral plantar feet wounds secondary to prolonged pressure. I changed him to Iodoflex last week. MolecuLight showing reddened blush fluorescence 10/11; patient presents for follow-up. He has no issues or complaints today. He denies signs of infection. He continues  to use Iodoflex and antibiotic ointment to the wound beds. 10/27; 2-week follow-up. No evidence of infection. He has callus and thick dry skin around the wound margins we have been using Iodoflex and Bactroban which was in response to a moderate left MolecuLight reddish blush fluorescence. 11/10; 2-week follow-up. Wound margins again have thick callus however the measurements of the actual wound sites are a lot smaller. Everything looks reasonably healthy here. We have been using Iodoflex He was approved for prime matrix but I have elected to delay this given the improvement in the surface area. Hopefully I will not regret that decision as were getting close to the end of the year in terms of insurance payment 12/8; 2-week follow-up. Wounds are generally smaller in size. These were initially substantial wounds extending into the forefoot all the way into the heel on the bilateral plantar feet. They are now both located on the plantar heel distal aspect both of these have a lot of callus around the wounds I used a #5 curette to remove this on the right and the left also some subcutaneous debris to try and get the wound edges were using Iodoflex. He has heel offloading shoe 12/22; 2-week follow-up. Not really much improvement. He has thick callus around the outer edges of both wounds. I remove this there is some nonviable subcutaneous tissue as well. We have been using Iodoflex. Her intake nurse and myself spontaneously thought of a total contact cast I went back in May. At that time we really were not seeing much of an improvement with a cast although the wound was in a much different situation I would like to retry this in 2 weeks and I discussed this with the patient 08/12/2021; the patient has had some improvement with the Iodoflex. The the area on the left heel plantar more improved than the right. I had to put him in a total contact cast on the left although I decided to put that off for 2 weeks. I  am going to change his primary dressing to silver collagen. I think in both areas he has had some improvement most of the healing seems to be more proximal in the heel. The wounds are in the mid aspect. A lot of thick callus on the right heel however. 1/19; we are using silver collagen on both plantar heel areas. He has had some improvement today. The left did not require any debridement. He still had  some eschar on the right that was debrided but both seem to have contracted. I did not put it total contact cast on him today 2/2 we have been using silver collagen. The area on the right plantar heel has areas that appear to be epithelialized interspersed with dry flaking callus and dry skin. I removed this. This really looks better than on the other side. On the left still a large area with raised edges and debris on the surface. 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 He has no issues or complaints today. 2/9; not much change in the left foot wound with 1 week of a cast we are using silver collagen. Silver collagen on the right side. The right side has been the better wound surface. We will reapply the total contact cast on the left SRIHARI, HEDGES ([id number]) [id number] Page 4 of 17 2/16; not much improvement on either side I been using silver collagen with a total contact cast on the left. I'm changing the Hydrofera Blue still with a total contact cast on the left 2/23; some improvement on both sides. Disappointing that he has thick callus around the area that we are putting in a total contact cast on the left. We've been using Hydrofera Blue on both wound areas. This is a man who at essentially pressure ulcers in addition to ischemia caused by medications to support his blood pressure (pressors) in the ICU. He was being ventilated in  the standing position for severe Covid. A Shiley the wounds extended across his entire foot but are now localized to his plantar heels bilaterally. We have made progress however neither areas healed. I continue to think the total contact cast is helped albeit painstakingly slowly. He has never wanted a plastic surgery consult although I don't know that they would be interested in grafting in area in this location. 10/07/2021: Continued improvement bilaterally. There is still some callus around the left wound, despite the total contact cast. He has some increased pain in his right midfoot around 1 particular area. This has been painful in the past but seems to be a little bit worse. When his cast was removed today, there was an area on the heel of the left foot that looks a bit macerated. He is also complaining of pain in his left thigh and hip which he thinks is secondary to the limb length discrepancy caused by the cast. 10/14/2021: He continues to improve. A little bit less callus around the left wound. He continues to endorse pain in his right midfoot, but this is not as significant as it was last week. The maceration on his left heel is improved. 10/21/2021: Continued improvement to both wounds. The maceration on his left heel is no longer evident. Less callus bilaterally. Epithelialization progressing. 10/28/2021: Significant improvement this week. The right sided wound is nearly closed with just a small open area at the middle. No maceration seen on the left heel. Continued epithelialization on both sides. No concern for infection. 11/04/2021: T oday, the wounds were measured a little bit differently and come out as larger, but I actually think they are about the same to potentially even smaller, particularly on the left. He continues to accumulate some callus on the right. 11/11/2021: T oday, the patient is expressing some concern that the left wound, despite being in the total contact cast, is  not  progressing at the same rate as the 1 on the right. He is interested in trying a week without the cast to see how the wound does. The wounds are roughly the same size as last week, with the right perhaps being a little bit smaller. He continues to build up callus on both sites. 11/18/2021: Last week, I permitted the patient to go without his total contact cast, just to see if the cast was really making any difference. Today, both wounds have deteriorated to some extent, suggesting that the cast is providing benefit, at least on the left. Both are larger and have accumulated callus, slough, and other debris. 11/26/2021: I debrided both wounds quite aggressively last week in an effort to stimulate the healing cascade. This appears to have been effective as the left sided wound is a full centimeter shorter in length. Although the right was measured slightly larger, on inspection, it looks as though an area of epithelialized tissue was included in the measurements. We have been using PolyMem Ag on the wound surfaces with a total contact cast to the left. 12/02/2021: It appears that the intake personnel are including epithelialized tissue in his wound measurements; the right wound is almost completely epithelialized; there is just a crater at the proximal midfoot with some open areas. On the left, he has built up some callus, but the overall wound surface looks good. There is some senescent skin around the wound margin. He has been in PolyMem Ag bilaterally with a total contact cast on the left. 12/09/2021: The right wound is nearly closed; there is just a small open area at the mid calcaneus. On the left, the wound is smaller with minimal callus buildup. No significant drainage. 12/16/2021: The right calcaneal wound remains minimally open at the mid calcaneus; the rest has epithelialized. On the left, the wound is also a little bit smaller. There is some senescent tissue on the periphery. He is getting his  first application of a trial skin substitute called Vendaje today. 12/23/2021: The wound on his right calcaneus is nearly closed; there is just a small area at the most distal aspect of the calcaneus that is open. On the left, the area where we applied to the skin substitute has a healthier-looking bed of granulation tissue. The wound dimensions are not significantly different on this side but the wound surface is improved. 12/30/2021: The wound on the right calcaneus has not changed significantly aside from some accumulation of callus. On the left, the open area is smaller and continues to have an improved surface. He continues to accumulate callus around the wound. He is here for his third application of Vendaje. 01/06/2022: The right calcaneal wound is down to just a couple of millimeters. He continues to accumulate periwound callus. He unfortunately got his cast wet earlier in the week and his left foot is macerated, resulting in some superficial skin loss just distal to the open wound. The open wound itself, however, is much smaller and has a healthier appearing surface. He is here for his fourth application of Vendaje. 01/13/2022: The right calcaneal wound is about the same. Unfortunately, once again, his cast got wet and his foot is again macerated. This is caused the left calcaneal wound to enlarge. He is here for his fifth application of Vendaje. 01/20/2022: The right calcaneal wound is very small. There is some periwound callus accumulation. He purchased a new cast protector last week and this has been effective in avoiding the maceration that has  been occurring on the left. The left calcaneal wound is narrower and has a healthy and viable-appearing surface. He is here for his 6 application of Vendaje. 01/27/2022: The right calcaneal wound is down to just a pinhole. There is some periwound slough and callus. On the left, the wound is narrower and shorter by about a centimeter. The surface is robust  and viable-appearing. Unfortunately, the rep for the trial skin substitute product did not provide any for [place] to use today. 02/04/2022: The right calcaneal wound remains unchanged. There is more accumulated callus. On the left, although the intake nurse measured it a little bit longer, it looks about the same to me. The surface has a layer of slough, but underneath this, there is good granulation tissue. 02/10/2022: The right calcaneus wound is nearly closed. There is still some callus that builds up around the site. The left side looks about the same in terms of dimensions, but the surface is more robust and vital-appearing. 02/16/2022: The area of the right calcaneus that was nearly closed last week has closed, but there is a small opening at the mid foot where it looks like some moisture got retained and caused some reopening. The left foot wound is narrower and shallower. Both sites have a fair amount of periwound callus and eschar. 02/24/2022: The small midfoot opening on the right calcaneus is a little bit smaller today. The left foot wound is narrower and shallower. He continues to accumulate periwound callus. No concern for infection. 03/01/2022: The patient came to clinic early because he showered and got his cast wet. Fortunately, there is no significant maceration to his foot but the callus softened and it looks like the wound on his left calcaneus may be a little bit wider. The wound on his right calcaneus is just a narrow slit. Continued accumulation of periwound callus bilaterally. 03/08/2022: The wound on his right calcaneus is very nearly closed, just a small pinpoint opening under a bit of eschar; the left wound has come in quite a bit, as well. It is narrower and shorter than at our last visit. Still with accumulated callus and eschar bilaterally. 03/17/2022: The right calcaneal wound is healed. The left wound is smaller and the surface itself is very clean, but there is some blue-green  staining on the periwound callus, concerning for Pseudomonas aeruginosa. RACER, BREAN ([id number]) [id number] Page 5 of 17 03/23/2022: The right calcaneal wound remains closed. The left wound continues to contract. No further blue-green staining. Small amount of callus and slough accumulation. 03/28/2022: He came in early today because he had gotten his cast wet. On inspection, the wound itself did not get wet or macerated, just a little bit of the forefoot. The wound itself is basically unchanged. 04/07/2022: The right foot wound remains closed. The left wound is the smallest that I have seen it to date. It is narrower and shorter. It still continues to accumulate slough on the surface. 04/15/2022: There is a band of epithelium now dividing the small left plantar foot wound in 2. There is still some slough on the surface. 04/21/2022: The wound continues to narrow. Just a little bit of slough on the surface. He seems to be responding well to endoform. 04/28/2022: Continued slow contraction of the wound. There is a little slough on the surface and some periwound callus. We have been using endoform and total contact cast. 05/05/2022: The wound appears to have stalled. There is slough and some periwound eschar/callus. No concern for infection, however.  05/12/2022: Unfortunately, his right foot has reopened. It is located at the most posterior aspect of his surgical incision. The area was noted to have drainage coming from it when his padding was removed today. Underneath some callus and senescent skin, there is an opening. No purulent drainage or malodor. On the left foot, the wound is again unchanged. There is some light blue staining on the callus, but no malodor or purulent drainage. 10/13; right and left heel remanence of extensive plantar foot wounds. These are better than I remember by quite a big margin however he is still left with wounds on the left plantar heel and the  right plantar heel. Been using endoform bilaterally. A culture was done that showed apparently Pseudomonas but we are still waiting for the [name] antibiotic to use gentamicin today. He is still very active by description I am not sure about the offloading of his noncasted right foot 10/20; both wounds right and left heel debrided not much change from last week. [name] has arrived which is linezolid, gentamicin and ciprofloxacin we will use this with endoform. T contact cast on the left otal 06/02/2022: Both wounds are smaller today. There is still a fair amount of callus buildup around the right foot ulcer. The left is more superficial and nearly flush with the surrounding tissues. Also with slough and eschar buildup. 06/10/2022: The right sided wound appears to be nearly closed, if not completely so, although it is somewhat difficult to tell given the abnormal tissue and scarring in his foot. There is a fair amount of callus and crust accumulation. On the left, the wound looks about the same, again with callus and slough. He has an appointment next Thursday with [name] in podiatry; I am hopeful that there may be some reconstructive options available for Mr. Bonenberger. 06/16/2022: Both wounds have some eschar and callus accumulation. The right sided wound is extremely narrow and barely open; the left is narrower than last week. There is a little bit of slough. He has his appointment in podiatry later today. 06/23/2022: The patient met with [name] last week and unfortunately, there are no reconstructive options that he believes would be helpful. He did order an MRI to evaluate for osteomyelitis and fortunately, none was seen. The left sided wound is a little bit shorter and narrower today. The right sided wound is about the same. There is callus and eschar accumulation bilaterally. 06/29/2022: Both feet have improved from last week. There is epithelium making a valiant effort to creep  across the surface on the left. The right side looks like it got a little dry and the deep crevasse in his midfoot has cracked. Both have eschar and there is some slough on the left. 07/07/2022: Both feet have improved. There is epithelium completely covering the calcaneus on the right with just a small opening in the crevasse in his midfoot. On the left, the open area of tissue is smaller but he continues to build up callus/eschar and slough. 07/15/2022: The opening in the midfoot on the right is about the same size, covered with eschar and a little bit of slough. The open portion of the left wound is narrower and shorter with a bit of slough buildup. He admits to being on his feet more than recommended. 12/14; as far as I can tell everything on the right foot is closed. There is some eschar I removed some of this I cannot identify any open wound here. As usual this will be a  very vulnerable area going forward. On the left this looks really quite healthy. I was pleasantly surprised to see how good this looked. Wound is certainly smaller and there appears to be healthy epithelialization. He has been using Promogran on the right and endoform on the left. He has been offloading the right foot with a heel offloading boot and he has a running shoe on the right foot 08/04/2022: The right foot remains closed. He has a thick cushioned insole in his sneaker. The left sided wound is smaller with just some slough and eschar accumulation. He is wearing the heel off loader on this foot. 08/15/2022: The right foot remains closed. The left wound has narrowed further. There is some slough and eschar accumulation. 08/25/2022: We put him in a peg assist shoe insert and as a result, he has more epithelialization of the ulcer with minimal slough and eschar accumulation. 09/01/2022: The wound is smaller by about half this week. Still with some slough on the surface. The peg assist shoe seems to be doing a remarkable job  of adequately offloading the site. 09/08/2022: There is a little bit more epithelium coming in. There is some slough and callus buildup. 09/16/2022: The wound measures about the same size, but the epithelium that has grown in looks more robust and stronger. There is some slough and callus buildup. 09/23/2022: The wound remains about the same size. The skin edges are looking rather senescent. 09/29/2022: The aggressive debridement I performed last week seems to have been effective. The wound is smaller and has significantly less slough accumulation. 10/06/2022: There has been quite a bit of epithelialization since last week. There are still some open areas with slough accumulation. There is some callus buildup around the perimeter. 10/13/2022: Continued improvement. Minimal callus accumulation. 3/14; patient presents for follow-up. He has been using endoform to the wound bed without issues. He is using a surgical shoe with peg assist for offloading. 10/27/2022: The wound dimensions are stable. There is some senescent skin accumulation around the perimeter. 11/04/2022: Last week I performed a very aggressive debridement in an effort to stimulate the healing cascade. As has been the case when I have done this before, the wound has responded well. There has been epithelialization and contraction of the wound. There are just a couple of small open areas. 11/10/2022: Unfortunately, his foot got wet secondary to sweating and there has been some breakdown of the tissue, particularly the skin just adjacent to the BHUVAN, VILLAGRAN ([id number]) [phone].pdf Page 6 of 17 main ulcer. 11/18/2022: We continue to struggle with moisture-related tissue breakdown around the perimeter of his wound. This has caused the thin epithelium that had formed on the surface to disintegrate. The underlying surface of the wound has good granulation tissue. 11/25/2022: The edges of the wound are much less macerated,  but the surface is actually looking a little bit dry. There is slough accumulation. No obvious signs of infection. 12/01/2022: The wound edges are more macerated and broken down, making the wound larger. The surface has some slough on it. 12/08/2020: The wound looks better this week. There is significantly less maceration and moisture-related tissue breakdown. There is some slough on the wound surface. 12/15/2022: The wound continues to improve. There is almost no moisture-related tissue breakdown. There is epithelium beginning to fill in again from the edges. Light slough on the wound surface. 12/22/2022: More epithelium has filled him from around the edges. There is some slough on the surface. No evidence of moisture-related tissue breakdown.  01/05/2023: There has been more epithelialization, particularly at the posterior calcaneal aspect of the wound. There has been a little bit of moisture accumulation with minimal maceration. 01/12/2023: More epithelium has filled in. There is very minimal accumulation of slough. 01/20/2023: The wound is measuring a little bit smaller today. It did measure deeper, but this appears to be secondary to some heaped up senescent skin around the edges. The epithelium that has been present at the posterior aspect of the wound seems to be a bit more robust with greater integrity. 01/26/2023: He reports intense itching to the skin around the wound and there has been some breakdown here. It looks fungal in nature. The wound itself looks pretty good with improved epithelialization with just some slough buildup. He has a little callus along the wound edges. 02/02/2023: He has been treating the periwound skin with an over-the-counter athlete's foot medication and notes significant improvement in his pruritus. The skin looks better here, as well. The wound continues to epithelialize. There is light slough accumulation. 02/10/2023: The periwound skin still looks fairly angry. He reports  multiple small blisters that have opened up. He is not having the intense itching that he had prior, however. There has been more epithelialization at the calcaneus and there is minimal slough accumulation. 02/16/2023: The periwound skin is improving. An online review of dermatology rash images suggest that this may be hand-foot-and-mouth disease, isolated to the feet; the patient says he actually had this as a child. The calcaneus continues to epithelialize and the tissue was quite robust here. Very minimal slough accumulation on the open portion of the wound with a little bit of periwound callus buildup. 03/03/2023: The wound measurements are about the same, but there is more epithelium making its way to the surface. There is a little bit of crust and eschar around the edges and minimal slough on the surface. Electronic Signature(s) Signed: 03/03/2023 10:35:19 AM By: [name] MD FACS Entered By: [name] on 03/03/2023 10:35:19 -------------------------------------------------------------------------------- Physical Exam Details Patient Name: Date of Service: [name], [name] 03/03/2023 10:00 A M Medical Record Number: [id number] Patient Account Number: [id number] Date of Birth/Sex: Treating RN: 08/26/1975 (47 y.o. M) Primary Care Provider: Dorinda Hill Other Clinician: Referring Provider: Treating Provider/Extender: Jana Hakim in Treatment: 137 Constitutional . . . . no acute distress. Respiratory Normal work of breathing on room air. Notes 03/03/2023: The wound measurements are about the same, but there is more epithelium making its way to the surface. There is a little bit of crust and eschar around the edges and minimal slough on the surface. Electronic Signature(s) Signed: 03/03/2023 10:35:49 AM By: [name] MD FACS Entered By: [name] on 03/03/2023 10:35:48 [name] ([id number]) [id number] Page  7 of 17 -------------------------------------------------------------------------------- Physician Orders Details Patient Name: Date of Service: [name], TO [name] 03/03/2023 10:00 A M Medical Record Number: [id number] Patient Account Number: [id number] Date of Birth/Sex: Treating RN: 1976/04/17 (47 y.o. [name] Primary Care Provider: Dorinda Hill Other Clinician: Referring Provider: Treating Provider/Extender: Jana Hakim in Treatment: [phone] Verbal / Phone Orders: No Diagnosis Coding ICD-10 Coding Code Description [phone] Non-pressure chronic ulcer of other part of left foot with fat layer exposed Follow-up Appointments ppointment in 1 week. - [name] Room 3 Return A Anesthetic Wound #2 Left Calcaneus (In clinic) Topical Lidocaine 4% applied to wound bed - In clinic Bathing/ Shower/ Hygiene May shower and wash wound with soap and water. Edema  Control - Lymphedema / SCD / Other Bilateral Lower Extremities Avoid standing for long periods of time. Moisturize legs daily. - as needed Off-Loading Other: - keep pressure off of the bottom of your feet. Elevate legs throughout the day. Use the Shoe with the PegAssist off-loading insole Additional Orders / Instructions Follow Nutritious Diet - Try to get 70-100 grams of Protein a day+ Wound Treatment Wound #2 - Calcaneus Wound Laterality: Left Cleanser: Normal Saline (Generic) 1 x Per Day/30 Days Discharge Instructions: Cleanse the wound with Normal Saline prior to applying a clean dressing using gauze sponges, not tissue or cotton balls. Cleanser: Wound Cleanser (Generic) 1 x Per Day/30 Days Discharge Instructions: Cleanse the wound with wound cleanser prior to applying a clean dressing using gauze sponges, not tissue or cotton balls. Peri-Wound Care: Ketoconazole Cream 2% 1 x Per Day/30 Days Discharge Instructions: Apply Ketoconazole mixed with zinc to the periwound Peri-Wound Care: Zinc Oxide  Ointment 30g tube 1 x Per Day/30 Days Discharge Instructions: Apply Zinc Oxide to periwound with each dressing change Topical: Gentamicin (Generic) 1 x Per Day/30 Days Discharge Instructions: apply to the wound bed Prim Dressing: Maxorb Extra Ag+ Alginate Dressing, 4x4.75 (in/in) (Generic) 1 x Per Day/30 Days ary Discharge Instructions: Apply to wound bed as instructed Secondary Dressing: Drawtex 4x4 in (Generic) 1 x Per Day/30 Days Discharge Instructions: Apply over primary dressing as directed. Secondary Dressing: Optifoam Non-Adhesive Dressing, 4x4 in (Generic) 1 x Per Day/30 Days Discharge Instructions: Apply over primary dressing as directed. Secondary Dressing: Woven Gauze Sponge, Non-Sterile 4x4 in (Generic) 1 x Per Day/30 Days Discharge Instructions: Apply over primary dressing as directed. Secured With: 29M Medipore H Soft Cloth Surgical T ape, 4 x 10 (in/yd) (Generic) 1 x Per Day/30 Days Discharge Instructions: Secure with tape as directed. BABY, SAUCEMAN ([id number]) [id number] Page 8 of 17 Compression Wrap: Kerlix Roll 4.5x3.1 (in/yd) (Generic) 1 x Per Day/30 Days Discharge Instructions: Apply Kerlix and Coban compression as directed. Add-Ons: [employer] Offloading Boot, Size Regular 1 x Per Day/30 Days Electronic Signature(s) Signed: 03/03/2023 10:46:22 AM By: [name] MD FACS Entered By: [name] on 03/03/2023 10:36:09 -------------------------------------------------------------------------------- Problem List Details Patient Name: Date of Service: [name], [name] 03/03/2023 10:00 A M Medical Record Number: [id number] Patient Account Number: [id number] Date of Birth/Sex: Treating RN: 01-11-1976 (47 y.o. [name] Primary Care Provider: Dorinda Hill Other Clinician: Referring Provider: Treating Provider/Extender: Jana Hakim in Treatment: [id number] Active Problems ICD-10 Encounter Code  Description Active Date MDM Diagnosis [phone] Non-pressure chronic ulcer of other part of left foot with fat layer exposed 09/03/2020 No Yes Inactive Problems ICD-10 Code Description Active Date Inactive Date L97.512 Non-pressure chronic ulcer of other part of right foot with fat layer exposed 09/03/2020 09/03/2020 L89.893 Pressure ulcer of other site, stage 3 07/15/2020 07/15/2020 M62.81 Muscle weakness (generalized) 07/15/2020 07/15/2020 I10 Essential (primary) hypertension 07/15/2020 07/15/2020 M86.171 Other acute osteomyelitis, right ankle and foot 09/03/2020 09/03/2020 Resolved Problems Electronic Signature(s) Signed: 03/03/2023 10:33:52 AM By: [name] MD FACS Entered By: [name] on 03/03/2023 10:33:52 [name] ([id number]) [id number] Page 9 of 17 -------------------------------------------------------------------------------- Progress Note Details Patient Name: Date of Service: [name], TO [name] 03/03/2023 10:00 A M Medical Record Number: [id number] Patient Account Number: [id number] Date of Birth/Sex: Treating RN: 06/02/1976 (47 y.o. M) Primary Care Provider: Dorinda Hill Other Clinician: Referring Provider: Treating Provider/Extender: Jana Hakim in Treatment: 137 Subjective Chief Complaint Information obtained from Patient Bilateral  Plantar Foot Ulcers History of Present Illness (HPI) Wounds are12/03/2020 upon evaluation today patient presents for initial inspection here in our clinic concerning issues he has been having with the bottoms of his feet bilaterally. He states these actually occurred as wounds when he was hospitalized for 5 months secondary to Covid. He was apparently with tilting bed where he was in an upright position quite frequently and apparently this occurred in some way shape or form during that time. Fortunately there is no sign of active infection at this time. No fevers, chills, nausea,  vomiting, or diarrhea. With that being said he still has substantial wounds on the plantar aspects of his feet Theragen require quite a bit of work to get these to heal. He has been using Santyl currently though that is been problematic both in receiving the medication as well as actually paid for it as it is become quite expensive. Prior to the experience with Covid the patient really did not have any major medical problems other than hypertension he does have some mild generalized weakness following the Covid experience. 07/22/2020 on evaluation today patient appears to be doing okay in regard to his foot ulcers I feel like the wound beds are showing signs of better improvement that I do believe the Iodoflex is helping in this regard. With that being said he does have a lot of drainage currently and this is somewhat blue/green in nature which is consistent with Pseudomonas. I do think a culture today would be appropriate for [place] to evaluate and see if that is indeed the case I would likely start him on antibiotic orally as well he is not allergic to Cipro knows of no issues he has had in the past 12/21; patient was admitted to the clinic earlier this month with bilateral presumed pressure ulcers on the bottom of his feet apparently related to excessive pressure from a tilt table arrangement in the intensive care unit. Patient relates this to being on ECMO but I am not really sure that is exactly related to that. I must say I have never seen anything like this. He has fairly extensive full-thickness wounds extending from his heel towards his midfoot mostly centered laterally. There is already been some healing distally. He does not appear to have an arterial issue. He has been using gentamicin to the wound surfaces with Iodoflex to help with ongoing debridement 1/6; this is a patient with pressure ulcers on the bottom of his feet related to excessive pressure from a standing position in the intensive  care unit. He is complaining of a lot of pain in the right heel. He is not a diabetic. 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 We have been using Iodoflex to help prepare the surfaces of both wounds for an advanced treatment product. He is nonambulatory spending most of his time in a wheelchair I have asked him not to propel the wheelchair with his heels 1/13; in general his wounds look better not much surface area change we have been using Iodoflex as of last week. I did an x-ray of the right heel as the patient was complaining of pain. I had some thoughts about a stress fracture perhaps Achilles tendon problems however what it showed was erosive changes along the inferior aspect of the calcaneus he now has a MRI booked for 1/20. 1/20; in general his wounds continue to be better. Some improvement in the large narrow areas proximally in his foot. He is still complaining of  pain in the right heel and tenderness in certain areas of this wound. His MRI is tonight. I am not just looking for osteomyelitis that was brought up on the x-ray I am wondering about stress fractures, tendon ruptures etc. He has no such findings on the left. Also noteworthy is that the patient had critical illness neuropathy and some of the discomfort may be actual improvement in nerve function I am just not sure. These wounds were initially in the setting of severe critical illness related to COVID-19. He was put in a standing position. He may have also been on pressors at the point contributing to tissue ischemia. By his description at some point these wounds were grossly necrotic extending proximally up into the Achilles part of his heel. I do not know that I have ever really seen pictures of them like this although they may exist in epic We have ordered Tri layer Oasis. I am trying to stimulate some granulation in these areas. This is of course assuming the MRI is negative for infection 1/27; since the  patient was last here he saw [name] of infectious disease. He is planned for vancomycin and ceftriaxone. Prior operative culture grew MSSA. Also ordered baseline lab work. He also ordered arterial studies although the ABIs in our clinic were normal as well as his clinical exam these were normal I do not think he needs to see vascular surgery. His ABIs at the [facility] were 1.22 in the right triphasic waveforms with a normal TBI of 1.15 on the left ABI of 1.22 with triphasic waveforms and a normal TBI of 1.08. Finally he saw [name] who will follow him in for 2 months. At this point I do not think he felt that he needed a procedure on the right calcaneal bone. [name] is elected for broad-spectrum antibiotic The patient is still having pain in the right heel. He walks with a walker 2/3; wounds are generally smaller. He is tolerating his IV antibiotics. I believe this is vancomycin and ceftriaxone. We are still waiting for Oasis burn in terms of his out-of-pocket max which he should be meeting soon given the IV antibiotics, MRIs etc. I have asked him to check in on this. We are using silver collagen in the meantime the wounds look better 2/10; tolerating IV vancomycin and Rocephin. We are waiting to apply for Oasis. Although I am not really sure where he is in his out-of-pocket max. 2/17 started the first application of Oasis trilayer. Still on antibiotics. The wounds have generally look better. The area on the left has a little more surface slough requiring debridement 2/24; second application of Oasis trilayer. The wound surface granulation is generally look better. The area on the left with undermining laterally I think is come in a bit. 10/08/2020 upon evaluation today patient is here today for [employer] application #3. Fortunately he seems to be doing extremely well with regard to this and we are seeing a lot of new epithelial growth which is great news. Fortunately there is no signs of  active infection at this time. 10/16/2020 upon evaluation today patient appears to be doing well with regard to his foot ulcers. Do believe the Oasis has been of benefit for him. I do not see any signs of infection right now which is great news and I think that he has a lot of new epithelial growth which is great to see as well. The patient is very pleased to hear all of this. I  do think we can proceed with the Oasis trilayer #4 today. 3/18; not as much improvement in these areas on his heels that I was hoping. I did reapply trilateral Oasis today the tissue looks healthier but not as much fill in as I was hoping. 3/25; better looking today I think this is come in a bit the tissue looks healthier. Triple layer Oasis reapplied #6 4/1; somewhat better looking definitely better looking surface not as much change in surface area as I was hoping. He may be spending more time [place] on days then he needs to although he does have heel offloading boots. Triple layer Oasis reapplied #7 4/7; unfortunately apparently [facility] will not approve any further Oasis which is unfortunate since the patient did respond nicely both in terms of the condition of the wound bed as well as surface area. There is still some drainage coming from the wound but not a lot there does not appear to be any infection DAMONE, SPURGIN ([id number]) [phone].pdf Page 10 of 17 4/15; we have been using Hydrofera Blue. He continues to have nice rims of epithelialization on the right greater than the left. The left the epithelialization is coming from the tip of his heel. There is moderate drainage. In this that concerns me about a total contact cast. There is no evidence of infection 4/29; patient has been using Hydrofera Blue with dressing changes. He has no complaints or issues today. 5/5; using Hydrofera Blue. I actually think that he looks marginally better than the last time I saw this 3 weeks ago.  There are rims of epithelialization on the left thumb coming from the medial side on the right. Using Hydrofera Blue 5/12; using Hydrofera Blue. These continue to make improvements in surface area. His drainage was not listed as severe I therefore went ahead and put a cast on the left foot. Right foot we will continue to dress his previous 5/16; back for first total contact cast change. He did not tolerate this particularly well cast injury on the anterior tibia among other issues. Difficulty sleeping. I talked him about this in some detail and afterwards is elected to continue. I told him I would like to have a cast on for 3 weeks to see if this is going to help at all. I think he agreed 5/19; I think the wound is better. There is no tunneling towards his midfoot. The undermining medially also looks better. He has a rim of new skin distally. I think we are making progress here. The area on the left also continues to look somewhat better to me using Hydrofera Blue. He has a list of complaints about the cast but none of them seem serious 5/26; patient presents for 1 week follow-up. He has been using a total contact cast and tolerating this well. Hydrofera Blue is the main dressing used. He denies signs of infection. 6/2 Hydrofera Blue total contact cast on the left. These were large ulcers that formed in intensive care unit where the patient was recovering from COVID. May have had something to do with being ventilated in an upright positiono Pressors etc. We have been able to get the areas down considerably and a viable surface. There is some epithelialization in both sides. Note made of drainage 6/9; changed to [facility] last time because of drainage. He arrives with better looking surfaces and dimensions on the left than the right. Paradoxically the right actually probes more towards his midfoot the left is largely  close down but both of these look improved. Using a total contact cast on the  left 6/16; complex wounds on his bilateral plantar heels which were initially pressure injury from a stay in the ICU with COVID. We have been using silver alginate most recently. His dimensions of come in quite dramatically however not recently. We have been putting the left foot in a total contact cast 6/23; complex wounds on the bilateral plantar heels. I been putting the left in the cast paradoxically the area on the right is the one that is going towards closure at a faster rate. Quite a bit of drainage on the left. The patient went to see [name] who said he was going to standby for skin grafts. I had actually considered sending him for skin grafts however he would be mandatorily off his feet for a period of weeks to months. I am thinking that the area on the right is going to close on its own the area on the left has been more stubborn even though we have him in a total contact cast 6/30; took him out of a total contact cast last week is the right heel seem to be making better progress than the left where I was placing the cast. We are using silver alginate. Both wounds are smaller right greater than left 7/12; both wounds look as though they are making some progress. We are using silver alginate. Heel offloading boots 7/26; very gradual progress especially on the right. Using silver alginate. He is wearing heel offloading boots 8/18; he continues to close these wounds down very gradually. Using silver alginate. The problem polymen being definitive about this is areas of what appears to be callus around the margins. This is not a 100% of the area but certainly sizable especially on the right 9/1; bilateral plantar feet wounds secondary to prolonged pressure while being ventilated for COVID-19 in an upright position. Essentially pressure ulcers on the bottom of his feet. He is made substantial progress using silver alginate. 9/14; bilateral plantar feet wounds secondary to prolonged pressure.  Making progress using silver alginate. 9/29 bilateral plantar feet wounds secondary to prolonged pressure. I changed him to Iodoflex last week. MolecuLight showing reddened blush fluorescence 10/11; patient presents for follow-up. He has no issues or complaints today. He denies signs of infection. He continues to use Iodoflex and antibiotic ointment to the wound beds. 10/27; 2-week follow-up. No evidence of infection. He has callus and thick dry skin around the wound margins we have been using Iodoflex and Bactroban which was in response to a moderate left MolecuLight reddish blush fluorescence. 11/10; 2-week follow-up. Wound margins again have thick callus however the measurements of the actual wound sites are a lot smaller. Everything looks reasonably healthy here. We have been using Iodoflex He was approved for prime matrix but I have elected to delay this given the improvement in the surface area. Hopefully I will not regret that decision as were getting close to the end of the year in terms of insurance payment 12/8; 2-week follow-up. Wounds are generally smaller in size. These were initially substantial wounds extending into the forefoot all the way into the heel on the bilateral plantar feet. They are now both located on the plantar heel distal aspect both of these have a lot of callus around the wounds I used a #5 curette to remove this on the right and the left also some subcutaneous debris to try and get the wound edges were using Iodoflex.  He has heel offloading shoe 12/22; 2-week follow-up. Not really much improvement. He has thick callus around the outer edges of both wounds. I remove this there is some nonviable subcutaneous tissue as well. We have been using Iodoflex. Her intake nurse and myself spontaneously thought of a total contact cast I went back in May. At that time we really were not seeing much of an improvement with a cast although the wound was in a much different situation  I would like to retry this in 2 weeks and I discussed this with the patient 08/12/2021; the patient has had some improvement with the Iodoflex. The the area on the left heel plantar more improved than the right. I had to put him in a total contact cast on the left although I decided to put that off for 2 weeks. I am going to change his primary dressing to silver collagen. I think in both areas he has had some improvement most of the healing seems to be more proximal in the heel. The wounds are in the mid aspect. A lot of thick callus on the right heel however. 1/19; we are using silver collagen on both plantar heel areas. He has had some improvement today. The left did not require any debridement. He still had some eschar on the right that was debrided but both seem to have contracted. I did not put it total contact cast on him today 2/2 we have been using silver collagen. The area on the right plantar heel has areas that appear to be epithelialized interspersed with dry flaking callus and dry skin. I removed this. This really looks better than on the other side. On the left still a large area with raised edges and debris on the surface. 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 He has no issues or complaints today. 2/9; not much change in the left foot wound with 1 week of a cast we are using silver collagen. Silver collagen on the right side. The right side has been the better wound surface. We will reapply the total contact cast on the left 2/16; not much improvement on either side I been using silver collagen with a total contact cast on the left. I'm changing the Hydrofera Blue still with a total contact cast on the left 2/23; some improvement on both sides. Disappointing that he has thick callus around the area that we are putting in a total contact cast on the left.  We've been using Hydrofera Blue on both wound areas. This is a man who at essentially pressure ulcers in addition to ischemia caused by medications to support his blood pressure (pressors) in the ICU. He was being ventilated in the standing position for severe Covid. A Shiley the wounds extended across his entire foot but are now localized to his plantar heels bilaterally. We have made progress however neither areas healed. I continue to think the total contact cast is helped albeit painstakingly slowly. He has never wanted a plastic surgery consult although I don't know that they would be interested in grafting in area in this location. 10/07/2021: Continued improvement bilaterally. There is still some callus around the left wound, despite the total contact cast. He has some increased pain in his right midfoot around 1 particular area. This has been painful in the past but seems to be a little bit worse.  When his cast was removed today, there was an area on the heel of the left foot that looks a bit macerated. He is also complaining of pain in his left thigh and hip which he thinks is secondary to the limb length discrepancy caused by the cast. 10/14/2021: He continues to improve. A little bit less callus around the left wound. He continues to endorse pain in his right midfoot, but this is not as significant as it was last week. The maceration on his left heel is improved. 10/21/2021: Continued improvement to both wounds. The maceration on his left heel is no longer evident. Less callus bilaterally. Epithelialization progressing. VALENTINE, BREAUX ([id number]) [id number] Page 11 of 17 10/28/2021: Significant improvement this week. The right sided wound is nearly closed with just a small open area at the middle. No maceration seen on the left heel. Continued epithelialization on both sides. No concern for infection. 11/04/2021: T oday, the wounds were measured a little bit  differently and come out as larger, but I actually think they are about the same to potentially even smaller, particularly on the left. He continues to accumulate some callus on the right. 11/11/2021: T oday, the patient is expressing some concern that the left wound, despite being in the total contact cast, is not progressing at the same rate as the 1 on the right. He is interested in trying a week without the cast to see how the wound does. The wounds are roughly the same size as last week, with the right perhaps being a little bit smaller. He continues to build up callus on both sites. 11/18/2021: Last week, I permitted the patient to go without his total contact cast, just to see if the cast was really making any difference. Today, both wounds have deteriorated to some extent, suggesting that the cast is providing benefit, at least on the left. Both are larger and have accumulated callus, slough, and other debris. 11/26/2021: I debrided both wounds quite aggressively last week in an effort to stimulate the healing cascade. This appears to have been effective as the left sided wound is a full centimeter shorter in length. Although the right was measured slightly larger, on inspection, it looks as though an area of epithelialized tissue was included in the measurements. We have been using PolyMem Ag on the wound surfaces with a total contact cast to the left. 12/02/2021: It appears that the intake personnel are including epithelialized tissue in his wound measurements; the right wound is almost completely epithelialized; there is just a crater at the proximal midfoot with some open areas. On the left, he has built up some callus, but the overall wound surface looks good. There is some senescent skin around the wound margin. He has been in PolyMem Ag bilaterally with a total contact cast on the left. 12/09/2021: The right wound is nearly closed; there is just a small open area at the mid calcaneus. On the  left, the wound is smaller with minimal callus buildup. No significant drainage. 12/16/2021: The right calcaneal wound remains minimally open at the mid calcaneus; the rest has epithelialized. On the left, the wound is also a little bit smaller. There is some senescent tissue on the periphery. He is getting his first application of a trial skin substitute called Vendaje today. 12/23/2021: The wound on his right calcaneus is nearly closed; there is just a small area at the most distal aspect of the calcaneus that is open. On the left, the area where we  applied to the skin substitute has a healthier-looking bed of granulation tissue. The wound dimensions are not significantly different on this side but the wound surface is improved. 12/30/2021: The wound on the right calcaneus has not changed significantly aside from some accumulation of callus. On the left, the open area is smaller and continues to have an improved surface. He continues to accumulate callus around the wound. He is here for his third application of Vendaje. 01/06/2022: The right calcaneal wound is down to just a couple of millimeters. He continues to accumulate periwound callus. He unfortunately got his cast wet earlier in the week and his left foot is macerated, resulting in some superficial skin loss just distal to the open wound. The open wound itself, however, is much smaller and has a healthier appearing surface. He is here for his fourth application of Vendaje. 01/13/2022: The right calcaneal wound is about the same. Unfortunately, once again, his cast got wet and his foot is again macerated. This is caused the left calcaneal wound to enlarge. He is here for his fifth application of Vendaje. 01/20/2022: The right calcaneal wound is very small. There is some periwound callus accumulation. He purchased a new cast protector last week and this has been effective in avoiding the maceration that has been occurring on the left. The left  calcaneal wound is narrower and has a healthy and viable-appearing surface. He is here for his 6 application of Vendaje. 01/27/2022: The right calcaneal wound is down to just a pinhole. There is some periwound slough and callus. On the left, the wound is narrower and shorter by about a centimeter. The surface is robust and viable-appearing. Unfortunately, the rep for the trial skin substitute product did not provide any for [place] to use today. 02/04/2022: The right calcaneal wound remains unchanged. There is more accumulated callus. On the left, although the intake nurse measured it a little bit longer, it looks about the same to me. The surface has a layer of slough, but underneath this, there is good granulation tissue. 02/10/2022: The right calcaneus wound is nearly closed. There is still some callus that builds up around the site. The left side looks about the same in terms of dimensions, but the surface is more robust and vital-appearing. 02/16/2022: The area of the right calcaneus that was nearly closed last week has closed, but there is a small opening at the mid foot where it looks like some moisture got retained and caused some reopening. The left foot wound is narrower and shallower. Both sites have a fair amount of periwound callus and eschar. 02/24/2022: The small midfoot opening on the right calcaneus is a little bit smaller today. The left foot wound is narrower and shallower. He continues to accumulate periwound callus. No concern for infection. 03/01/2022: The patient came to clinic early because he showered and got his cast wet. Fortunately, there is no significant maceration to his foot but the callus softened and it looks like the wound on his left calcaneus may be a little bit wider. The wound on his right calcaneus is just a narrow slit. Continued accumulation of periwound callus bilaterally. 03/08/2022: The wound on his right calcaneus is very nearly closed, just a small pinpoint opening  under a bit of eschar; the left wound has come in quite a bit, as well. It is narrower and shorter than at our last visit. Still with accumulated callus and eschar bilaterally. 03/17/2022: The right calcaneal wound is healed. The left wound is smaller  and the surface itself is very clean, but there is some blue-green staining on the periwound callus, concerning for Pseudomonas aeruginosa. 03/23/2022: The right calcaneal wound remains closed. The left wound continues to contract. No further blue-green staining. Small amount of callus and slough accumulation. 03/28/2022: He came in early today because he had gotten his cast wet. On inspection, the wound itself did not get wet or macerated, just a little bit of the forefoot. The wound itself is basically unchanged. 04/07/2022: The right foot wound remains closed. The left wound is the smallest that I have seen it to date. It is narrower and shorter. It still continues to accumulate slough on the surface. 04/15/2022: There is a band of epithelium now dividing the small left plantar foot wound in 2. There is still some slough on the surface. 04/21/2022: The wound continues to narrow. Just a little bit of slough on the surface. He seems to be responding well to endoform. 04/28/2022: Continued slow contraction of the wound. There is a little slough on the surface and some periwound callus. We have been using endoform and total contact cast. 05/05/2022: The wound appears to have stalled. There is slough and some periwound eschar/callus. No concern for infection, however. 05/12/2022: Unfortunately, his right foot has reopened. It is located at the most posterior aspect of his surgical incision. The area was noted to have drainage coming from it when his padding was removed today. Underneath some callus and senescent skin, there is an opening. No purulent drainage or malodor. On JERROL, EMARD ([id number]) [id number] Page 12 of 17 the left  foot, the wound is again unchanged. There is some light blue staining on the callus, but no malodor or purulent drainage. 10/13; right and left heel remanence of extensive plantar foot wounds. These are better than I remember by quite a big margin however he is still left with wounds on the left plantar heel and the right plantar heel. Been using endoform bilaterally. A culture was done that showed apparently Pseudomonas but we are still waiting for the [facility] antibiotic to use gentamicin today. He is still very active by description I am not sure about the offloading of his noncasted right foot 10/20; both wounds right and left heel debrided not much change from last week. [name] has arrived which is linezolid, gentamicin and ciprofloxacin we will use this with endoform. T contact cast on the left otal 06/02/2022: Both wounds are smaller today. There is still a fair amount of callus buildup around the right foot ulcer. The left is more superficial and nearly flush with the surrounding tissues. Also with slough and eschar buildup. 06/10/2022: The right sided wound appears to be nearly closed, if not completely so, although it is somewhat difficult to tell given the abnormal tissue and scarring in his foot. There is a fair amount of callus and crust accumulation. On the left, the wound looks about the same, again with callus and slough. He has an appointment next Thursday with [name] in podiatry; I am hopeful that there may be some reconstructive options available for Mr. Hulgan. 06/16/2022: Both wounds have some eschar and callus accumulation. The right sided wound is extremely narrow and barely open; the left is narrower than last week. There is a little bit of slough. He has his appointment in podiatry later today. 06/23/2022: The patient met with [name] last week and unfortunately, there are no reconstructive options that he believes would be helpful. He did order an MRI to  evaluate for osteomyelitis and fortunately, none was seen. The left sided wound is a little bit shorter and narrower today. The right sided wound is about the same. There is callus and eschar accumulation bilaterally. 06/29/2022: Both feet have improved from last week. There is epithelium making a valiant effort to creep across the surface on the left. The right side looks like it got a little dry and the deep crevasse in his midfoot has cracked. Both have eschar and there is some slough on the left. 07/07/2022: Both feet have improved. There is epithelium completely covering the calcaneus on the right with just a small opening in the crevasse in his midfoot. On the left, the open area of tissue is smaller but he continues to build up callus/eschar and slough. 07/15/2022: The opening in the midfoot on the right is about the same size, covered with eschar and a little bit of slough. The open portion of the left wound is narrower and shorter with a bit of slough buildup. He admits to being on his feet more than recommended. 12/14; as far as I can tell everything on the right foot is closed. There is some eschar I removed some of this I cannot identify any open wound here. As usual this will be a very vulnerable area going forward. On the left this looks really quite healthy. I was pleasantly surprised to see how good this looked. Wound is certainly smaller and there appears to be healthy epithelialization. He has been using Promogran on the right and endoform on the left. He has been offloading the right foot with a heel offloading boot and he has a running shoe on the right foot 08/04/2022: The right foot remains closed. He has a thick cushioned insole in his sneaker. The left sided wound is smaller with just some slough and eschar accumulation. He is wearing the heel off loader on this foot. 08/15/2022: The right foot remains closed. The left wound has narrowed further. There is some slough and eschar  accumulation. 08/25/2022: We put him in a peg assist shoe insert and as a result, he has more epithelialization of the ulcer with minimal slough and eschar accumulation. 09/01/2022: The wound is smaller by about half this week. Still with some slough on the surface. The peg assist shoe seems to be doing a remarkable job of adequately offloading the site. 09/08/2022: There is a little bit more epithelium coming in. There is some slough and callus buildup. 09/16/2022: The wound measures about the same size, but the epithelium that has grown in looks more robust and stronger. There is some slough and callus buildup. 09/23/2022: The wound remains about the same size. The skin edges are looking rather senescent. 09/29/2022: The aggressive debridement I performed last week seems to have been effective. The wound is smaller and has significantly less slough accumulation. 10/06/2022: There has been quite a bit of epithelialization since last week. There are still some open areas with slough accumulation. There is some callus buildup around the perimeter. 10/13/2022: Continued improvement. Minimal callus accumulation. 3/14; patient presents for follow-up. He has been using endoform to the wound bed without issues. He is using a surgical shoe with peg assist for offloading. 10/27/2022: The wound dimensions are stable. There is some senescent skin accumulation around the perimeter. 11/04/2022: Last week I performed a very aggressive debridement in an effort to stimulate the healing cascade. As has been the case when I have done this before, the wound has responded well. There has  been epithelialization and contraction of the wound. There are just a couple of small open areas. 11/10/2022: Unfortunately, his foot got wet secondary to sweating and there has been some breakdown of the tissue, particularly the skin just adjacent to the main ulcer. 11/18/2022: We continue to struggle with moisture-related tissue breakdown around  the perimeter of his wound. This has caused the thin epithelium that had formed on the surface to disintegrate. The underlying surface of the wound has good granulation tissue. 11/25/2022: The edges of the wound are much less macerated, but the surface is actually looking a little bit dry. There is slough accumulation. No obvious signs of infection. 12/01/2022: The wound edges are more macerated and broken down, making the wound larger. The surface has some slough on it. 12/08/2020: The wound looks better this week. There is significantly less maceration and moisture-related tissue breakdown. There is some slough on the wound surface. 12/15/2022: The wound continues to improve. There is almost no moisture-related tissue breakdown. There is epithelium beginning to fill in again from the edges. Light slough on the wound surface. 12/22/2022: More epithelium has filled him from around the edges. There is some slough on the surface. No evidence of moisture-related tissue breakdown. 01/05/2023: There has been more epithelialization, particularly at the posterior calcaneal aspect of the wound. There has been a little bit of moisture accumulation with minimal maceration. KAMDON, HAMBEL ([id number]) [id number] Page 13 of 17 01/12/2023: More epithelium has filled in. There is very minimal accumulation of slough. 01/20/2023: The wound is measuring a little bit smaller today. It did measure deeper, but this appears to be secondary to some heaped up senescent skin around the edges. The epithelium that has been present at the posterior aspect of the wound seems to be a bit more robust with greater integrity. 01/26/2023: He reports intense itching to the skin around the wound and there has been some breakdown here. It looks fungal in nature. The wound itself looks pretty good with improved epithelialization with just some slough buildup. He has a little callus along the wound edges. 02/02/2023: He  has been treating the periwound skin with an over-the-counter athlete's foot medication and notes significant improvement in his pruritus. The skin looks better here, as well. The wound continues to epithelialize. There is light slough accumulation. 02/10/2023: The periwound skin still looks fairly angry. He reports multiple small blisters that have opened up. He is not having the intense itching that he had prior, however. There has been more epithelialization at the calcaneus and there is minimal slough accumulation. 02/16/2023: The periwound skin is improving. An online review of dermatology rash images suggest that this may be hand-foot-and-mouth disease, isolated to the feet; the patient says he actually had this as a child. The calcaneus continues to epithelialize and the tissue was quite robust here. Very minimal slough accumulation on the open portion of the wound with a little bit of periwound callus buildup. 03/03/2023: The wound measurements are about the same, but there is more epithelium making its way to the surface. There is a little bit of crust and eschar around the edges and minimal slough on the surface. Patient History Information obtained from Patient. Family History Cancer - Maternal Grandparents, Diabetes - Father,Paternal Grandparents, Heart Disease - Maternal Grandparents, Hypertension - Father,Paternal Grandparents, Lung Disease - Siblings, No family history of Hereditary Spherocytosis, Kidney Disease, Seizures, Stroke, Thyroid Problems, Tuberculosis. Social History Never smoker, Marital Status - Married, Alcohol Use - Never, Drug Use - No History,  Caffeine Use - Daily - tea, soda. Medical History Eyes Denies history of Cataracts, Glaucoma, Optic Neuritis Ear/Nose/Mouth/Throat Denies history of Chronic sinus problems/congestion, Middle ear problems Hematologic/Lymphatic Denies history of Anemia, Hemophilia, Human Immunodeficiency Virus, Lymphedema, Sickle Cell  Disease Respiratory Patient has history of Asthma Denies history of Aspiration, Chronic Obstructive Pulmonary Disease (COPD), Pneumothorax, Sleep Apnea, Tuberculosis Cardiovascular Patient has history of Angina - with COVID, Hypertension Denies history of Arrhythmia, Congestive Heart Failure, Coronary Artery Disease, Deep Vein Thrombosis, Hypotension, Myocardial Infarction, Peripheral Arterial Disease, Peripheral Venous Disease, Phlebitis, Vasculitis Gastrointestinal Denies history of Cirrhosis , Colitis, Crohns, Hepatitis A, Hepatitis B, Hepatitis C Endocrine Denies history of Type I Diabetes, Type II Diabetes Genitourinary Denies history of End Stage Renal Disease Immunological Denies history of Lupus Erythematosus, Raynauds, Scleroderma Integumentary (Skin) Denies history of History of Burn Musculoskeletal Denies history of Gout, Rheumatoid Arthritis, Osteoarthritis, Osteomyelitis Neurologic Denies history of Dementia, Neuropathy, Quadriplegia, Paraplegia, Seizure Disorder Oncologic Denies history of Received Chemotherapy, Received Radiation Psychiatric Denies history of Anorexia/bulimia, Confinement Anxiety Hospitalization/Surgery History - COVID PNA 07/22/2019- 11/14/2019. - 03/27/2020 wound debridement/ skin graft. Medical A Surgical History Notes nd Constitutional Symptoms (General Health) COVID PNA 07/22/2019-11/14/2019 VENT ECMO, foot drop left foot , Genitourinary kidney stone Psychiatric anxiety Objective Constitutional no acute distress. NARAYANA, CONNELLEY ([id number]) [id number] Page 14 of 17 Vitals Time Taken: 10:04 AM, Height: 69 in, Weight: 280 lbs, BMI: 41.3, Temperature: 98.2 F, Pulse: 87 bpm, Respiratory Rate: 18 breaths/min, Blood Pressure: 126/84 mmHg. Respiratory Normal work of breathing on room air. General Notes: 03/03/2023: The wound measurements are about the same, but there is more epithelium making its way to the surface.  There is a little bit of crust and eschar around the edges and minimal slough on the surface. Integumentary (Hair, Skin) Wound #2 status is Open. Original cause of wound was Pressure Injury. The date acquired was: 10/07/2019. The wound has been in treatment 137 weeks. The wound is located on the Left Calcaneus. The wound measures 6.5cm length x 2.5cm width x 0.2cm depth; 12.763cm^2 area and 2.553cm^3 volume. There is Fat Layer (Subcutaneous Tissue) exposed. There is no tunneling or undermining noted. There is a medium amount of serosanguineous drainage noted. The wound margin is distinct with the outline attached to the wound base. There is large (67-100%) pink granulation within the wound bed. There is a small (1-33%) amount of necrotic tissue within the wound bed including Adherent Slough. The periwound skin appearance had no abnormalities noted for moisture. The periwound skin appearance exhibited: Scarring. The periwound skin appearance did not exhibit: Callus, Crepitus, Excoriation, Induration, Rash, Atrophie Blanche, Cyanosis, Ecchymosis, Hemosiderin Staining, Mottled, Pallor, Rubor, Erythema. Periwound temperature was noted as No Abnormality. The periwound has tenderness on palpation. Assessment Active Problems ICD-10 Non-pressure chronic ulcer of other part of left foot with fat layer exposed Procedures Wound #2 Pre-procedure diagnosis of Wound #2 is a Pressure Ulcer located on the Left Calcaneus . There was a Selective/Open Wound Non-Viable Tissue Debridement with a total area of 12.76 sq cm performed by [name], MD. With the following instrument(s): Curette to remove Viable and Non-Viable tissue/material. Material removed includes Eschar and Slough and after achieving pain control using Lidocaine 4% T opical Solution. No specimens were taken. A time out was conducted at 10:20, prior to the start of the procedure. A Minimum amount of bleeding was controlled with Pressure. The  procedure was tolerated well with a pain level of 0 throughout and a pain level of 0 following the  procedure. Post Debridement Measurements: 6.5cm length x 2.5cm width x 0.2cm depth; 2.553cm^3 volume. Post debridement Stage noted as Category/Stage III. Character of Wound/Ulcer Post Debridement is stable. Post procedure Diagnosis Wound #2: Same as Pre-Procedure General Notes: Scribed for Dr Lady [name] by Brenton Grills RN. Plan Follow-up Appointments: Return Appointment in 1 week. - [name] Room 3 Anesthetic: Wound #2 Left Calcaneus: (In clinic) Topical Lidocaine 4% applied to wound bed - In clinic Bathing/ Shower/ Hygiene: May shower and wash wound with soap and water. Edema Control - Lymphedema / SCD / Other: Avoid standing for long periods of time. Moisturize legs daily. - as needed Off-Loading: Other: - keep pressure off of the bottom of your feet. Elevate legs throughout the day. Use the Shoe with the PegAssist off-loading insole Additional Orders / Instructions: Follow Nutritious Diet - Try to get 70-100 grams of Protein a day+ WOUND #2: - Calcaneus Wound Laterality: Left Cleanser: Normal Saline (Generic) 1 x Per Day/30 Days Discharge Instructions: Cleanse the wound with Normal Saline prior to applying a clean dressing using gauze sponges, not tissue or cotton balls. Cleanser: Wound Cleanser (Generic) 1 x Per Day/30 Days Discharge Instructions: Cleanse the wound with wound cleanser prior to applying a clean dressing using gauze sponges, not tissue or cotton balls. Peri-Wound Care: Ketoconazole Cream 2% 1 x Per Day/30 Days Discharge Instructions: Apply Ketoconazole mixed with zinc to the periwound Peri-Wound Care: Zinc Oxide Ointment 30g tube 1 x Per Day/30 Days Discharge Instructions: Apply Zinc Oxide to periwound with each dressing change Topical: Gentamicin (Generic) 1 x Per Day/30 Days Discharge Instructions: apply to the wound bed Prim Dressing: Maxorb Extra Ag+ Alginate  Dressing, 4x4.75 (in/in) (Generic) 1 x Per Day/30 Days ary Discharge Instructions: Apply to wound bed as instructed Secondary Dressing: Drawtex 4x4 in (Generic) 1 x Per Day/30 Days Discharge Instructions: Apply over primary dressing as directed. Secondary Dressing: Optifoam Non-Adhesive Dressing, 4x4 in (Generic) 1 x Per Day/30 Days Discharge Instructions: Apply over primary dressing as directed. Secondary Dressing: Woven Gauze Sponge, Non-Sterile 4x4 in (Generic) 1 x Per Day/30 Days DEANNA, HANISH ([id number]) [id number].pdf Page 15 of 17 Discharge Instructions: Apply over primary dressing as directed. Secured With: 58M Medipore H Soft Cloth Surgical T ape, 4 x 10 (in/yd) (Generic) 1 x Per Day/30 Days Discharge Instructions: Secure with tape as directed. Compression Wrap: Kerlix Roll 4.5x3.1 (in/yd) (Generic) 1 x Per Day/30 Days Discharge Instructions: Apply Kerlix and Coban compression as directed. Add-Ons: [employer] Offloading Boot, Size Regular 1 x Per Day/30 Days 03/03/2023: The wound measurements are about the same, but there is more epithelium making its way to the surface. There is a little bit of crust and eschar around the edges and minimal slough on the surface. I used a curette to debride slough and eschar from his wound. We will continue to apply topical gentamicin to the wound surface with silver alginate and drawtex. We will apply zinc oxide and ketoconazole to the periwound. Continue offloading efforts. Follow-up in 1 week. Electronic Signature(s) Signed: 03/03/2023 10:37:00 AM By: [name] MD FACS Entered By: [name] on 03/03/2023 10:37:00 -------------------------------------------------------------------------------- HxROS Details Patient Name: Date of Service: [name], [name] 03/03/2023 10:00 A M Medical Record Number: [id number] Patient Account Number: [id number] Date of Birth/Sex: Treating RN: February 20, 1976 (47 y.o.  M) Primary Care Provider: Dorinda Hill Other Clinician: Referring Provider: Treating Provider/Extender: Jana Hakim in Treatment: 137 Information Obtained From Patient Constitutional Symptoms (General Health) Medical History: Past Medical  History Notes: COVID PNA 07/22/2019-11/14/2019 V[facility] ECMO, foot drop left foot , Eyes Medical History: Negative for: Cataracts; Glaucoma; Optic Neuritis Ear/Nose/Mouth/Throat Medical History: Negative for: Chronic sinus problems/congestion; Middle ear problems Hematologic/Lymphatic Medical History: Negative for: Anemia; Hemophilia; Human Immunodeficiency Virus; Lymphedema; Sickle Cell Disease Respiratory Medical History: Positive for: Asthma Negative for: Aspiration; Chronic Obstructive Pulmonary Disease (COPD); Pneumothorax; Sleep Apnea; Tuberculosis Cardiovascular Medical History: Positive for: Angina - with COVID; Hypertension Negative for: Arrhythmia; Congestive Heart Failure; Coronary Artery Disease; Deep Vein Thrombosis; Hypotension; Myocardial Infarction; Peripheral Arterial Disease; Peripheral Venous Disease; Phlebitis; Vasculitis Gastrointestinal Medical HistoryLADISLAUS, FINE ([id number]) [id number] Page 16 of 17 Negative for: Cirrhosis ; Colitis; Crohns; Hepatitis A; Hepatitis B; Hepatitis C Endocrine Medical History: Negative for: Type I Diabetes; Type II Diabetes Genitourinary Medical History: Negative for: End Stage Renal Disease Past Medical History Notes: kidney stone Immunological Medical History: Negative for: Lupus Erythematosus; Raynauds; Scleroderma Integumentary (Skin) Medical History: Negative for: History of Burn Musculoskeletal Medical History: Negative for: Gout; Rheumatoid Arthritis; Osteoarthritis; Osteomyelitis Neurologic Medical History: Negative for: Dementia; Neuropathy; Quadriplegia; Paraplegia; Seizure Disorder Oncologic Medical  History: Negative for: Received Chemotherapy; Received Radiation Psychiatric Medical History: Negative for: Anorexia/bulimia; Confinement Anxiety Past Medical History Notes: anxiety Immunizations Pneumococcal Vaccine: Received Pneumococcal Vaccination: No Implantable Devices None Hospitalization / Surgery History Type of Hospitalization/Surgery COVID PNA 07/22/2019- 11/14/2019 03/27/2020 wound debridement/ skin graft Family and Social History Cancer: Yes - Maternal Grandparents; Diabetes: Yes - Father,Paternal Grandparents; Heart Disease: Yes - Maternal Grandparents; Hereditary Spherocytosis: No; Hypertension: Yes - Father,Paternal Grandparents; Kidney Disease: No; Lung Disease: Yes - Siblings; Seizures: No; Stroke: No; Thyroid Problems: No; Tuberculosis: No; Never smoker; Marital Status - Married; Alcohol Use: Never; Drug Use: No History; Caffeine Use: Daily - tea, soda; Financial Concerns: No; Food, Clothing or Shelter Needs: No; Support System Lacking: No; Transportation Concerns: No Electronic Signature(s) Signed: 03/03/2023 10:46:22 AM By: [name] MD FACS Entered By: [name] on 03/03/2023 10:35:27 [name] ([id number]) [id number] Page 17 of 17 -------------------------------------------------------------------------------- SuperBill Details Patient Name: Date of Service: [name] TO [place] 03/03/2023 Medical Record Number: [id number] Patient Account Number: [id number] Date of Birth/Sex: Treating RN: 1976-02-12 (47 y.o. [name] Primary Care Provider: Dorinda Hill Other Clinician: Referring Provider: Treating Provider/Extender: Jana Hakim in Treatment: 137 Diagnosis Coding ICD-10 Codes Code Description [phone] Non-pressure chronic ulcer of other part of left foot with fat layer exposed Facility Procedures : CPT4 Code: [id number] Description: [phone] - DEBRIDE WOUND 1ST 20 SQ CM OR < ICD-10  Diagnosis Description L97.522 Non-pressure chronic ulcer of other part of left foot with fat layer exposed Modifier: Quantity: 1 Physician Procedures : CPT4 Code Description Modifier [id number] - WC PHYS LEVEL 4 - EST PT 25 ICD-10 Diagnosis Description L97.522 Non-pressure chronic ulcer of other part of left foot with fat layer exposed Quantity: 1 : [id number] - WC PHYS DEBR WO ANESTH 20 SQ CM ICD-10 Diagnosis Description L97.522 Non-pressure chronic ulcer of other part of left foot with fat layer exposed Quantity: 1 Electronic Signature(s) Signed: 03/03/2023 10:37:11 AM By: [name] MD FACS Entered By: [name] on 03/03/2023 10:37:11

## 2023-03-07 ENCOUNTER — Ambulatory Visit: Payer: PPO | Admitting: Dietician

## 2023-03-07 ENCOUNTER — Ambulatory Visit: Payer: PPO | Admitting: Skilled Nursing Facility1

## 2023-03-09 ENCOUNTER — Encounter (HOSPITAL_BASED_OUTPATIENT_CLINIC_OR_DEPARTMENT_OTHER): Payer: PPO | Attending: General Surgery | Admitting: General Surgery

## 2023-03-09 DIAGNOSIS — L89623 Pressure ulcer of left heel, stage 3: Secondary | ICD-10-CM | POA: Diagnosis not present

## 2023-03-09 DIAGNOSIS — J45909 Unspecified asthma, uncomplicated: Secondary | ICD-10-CM | POA: Diagnosis not present

## 2023-03-09 DIAGNOSIS — I10 Essential (primary) hypertension: Secondary | ICD-10-CM | POA: Diagnosis not present

## 2023-03-09 DIAGNOSIS — L97522 Non-pressure chronic ulcer of other part of left foot with fat layer exposed: Secondary | ICD-10-CM | POA: Diagnosis not present

## 2023-03-09 NOTE — Progress Notes (Signed)
KEVAN, SIBBALD ([id number]) [id number] Page 1 of 8 Visit Report for 03/09/2023 Arrival Information Details Patient Name: Date of Service: [name], TO [name] 03/09/2023 2:30 PM Medical Record Number: [id number] Patient Account Number: [id number] Date of Birth/Sex: Treating RN: 11-09-1975 (47 y.o. [name] Primary Care Stepfanie Yott: Dorinda [name] Other Clinician: Referring Krissi Willaims: Treating Mahad Newstrom/Extender: Jana Hakim in Treatment: 138 Visit Information History Since Last Visit Added or deleted any medications: No Patient Arrived: Wheel Chair Any new allergies or adverse reactions: No Arrival Time: 14:30 Had a fall or experienced change in No Accompanied By: son activities of daily living that may affect Transfer Assistance: None risk of falls: Patient Identification Verified: Yes Signs or symptoms of abuse/neglect since last visito No Secondary Verification Process Completed: Yes Hospitalized since last visit: No Patient Requires Transmission-Based Precautions: No Implantable device outside of the clinic excluding No Patient Has Alerts: No cellular tissue based products placed in the center since last visit: Has Dressing in Place as Prescribed: Yes Pain Present Now: Yes Electronic Signature(s) Signed: 03/09/2023 3:40:07 PM By: Redmond Pulling RN, BSN Entered By: Redmond Pulling on 03/09/2023 14:36:18 -------------------------------------------------------------------------------- Encounter Discharge Information Details Patient Name: Date of Service: [name], TO [name] 03/09/2023 2:30 PM Medical Record Number: [id number] Patient Account Number: [id number] Date of Birth/Sex: Treating RN: 1975/10/25 (47 y.o. [name] Primary Care Steffi Noviello: Dorinda [name] Other Clinician: Referring Dracen Reigle: Treating Math Brazie/Extender: Jana Hakim in Treatment: 138 Encounter Discharge Information Items Post Procedure  Vitals Discharge Condition: Stable Temperature (F): 98.8 Ambulatory Status: Wheelchair Pulse (bpm): 93 Discharge Destination: Home Respiratory Rate (breaths/min): 18 Transportation: Private Auto Blood Pressure (mmHg): 143/94 Accompanied By: son Schedule Follow-up Appointment: Yes Clinical Summary of Care: Patient Declined Electronic Signature(s) Signed: 03/09/2023 3:40:07 PM By: Redmond Pulling RN, BSN Entered By: Redmond Pulling on 03/09/2023 15:08:32 [name] ([id number]) [id number] Page 2 of 8 -------------------------------------------------------------------------------- Lower Extremity Assessment Details Patient Name: Date of Service: [name] 03/09/2023 2:30 PM Medical Record Number: [id number] Patient Account Number: [id number] Date of Birth/Sex: Treating RN: 1976-04-16 (47 y.o. [name] Primary Care Aysa Larivee: Dorinda [name] Other Clinician: Referring Christifer Chapdelaine: Treating Kaleb Linquist/Extender: Jana Hakim in Treatment: 138 Edema Assessment Assessed: [name]: No] Franne Forts: No] Edema: [Left: N] [Right: o] Calf Left: Right: Point of Measurement: 29 cm From Medial Instep 43.5 cm Ankle Left: Right: Point of Measurement: 9 cm From Medial Instep 24.8 cm Vascular Assessment Pulses: Dorsalis Pedis Palpable: [Left:Yes] Extremity colors, hair growth, and conditions: Extremity Color: [Left:Normal] Hair Growth on Extremity: [Left:Yes] Temperature of Extremity: [Left:Warm] Capillary Refill: [Left:< 3 seconds] Dependent Rubor: [Left:No No] Electronic Signature(s) Signed: 03/09/2023 3:40:07 PM By: Redmond Pulling RN, BSN Entered By: Redmond Pulling on 03/09/2023 14:37:43 -------------------------------------------------------------------------------- Multi Wound Chart Details Patient Name: Date of Service: [name], [name] 03/09/2023 2:30 PM Medical Record Number: [id number] Patient Account Number: [id number] Date of  Birth/Sex: Treating RN: 1975-12-25 (47 y.o. M) Primary Care Zyla Dascenzo: Dorinda [name] Other Clinician: Referring Ryah Cribb: Treating Takashi Korol/Extender: Jana Hakim in Treatment: 138 Vital Signs Height(in): 69 Pulse(bpm): 93 Weight(lbs): 280 Blood Pressure(mmHg): 143/94 Body Mass Index(BMI): 41.3 Temperature(F): 98.8 Respiratory Rate(breaths/min): 18 [2:Photos:] [N/A:N/A] Left Calcaneus N/A N/A Wound Location: Pressure Injury N/A N/A Wounding Event: Pressure Ulcer N/A N/A Primary Etiology: Asthma, Angina, Hypertension N/A N/A Comorbid History: 10/07/2019 N/A N/A Date Acquired: 138 N/A N/A Weeks of Treatment: Open N/A N/A Wound Status: No N/A N/A Wound Recurrence: 4.5x2x0.2 N/A N/A Measurements  L x W x D (cm) 7.069 N/A N/A A (cm) : rea 1.414 N/A N/A Volume (cm) : 73.90% N/A N/A % Reduction in A rea: 94.80% N/A N/A % Reduction in Volume: Category/Stage III N/A N/A Classification: Medium N/A N/A Exudate A mount: Serosanguineous N/A N/A Exudate Type: red, brown N/A N/A Exudate Color: Distinct, outline attached N/A N/A Wound Margin: Large (67-100%) N/A N/A Granulation A mount: Pink N/A N/A Granulation Quality: Small (1-33%) N/A N/A Necrotic A mount: Fat Layer (Subcutaneous Tissue): Yes N/A N/A Exposed Structures: Fascia: No Tendon: No Muscle: No Joint: No Bone: No Small (1-33%) N/A N/A Epithelialization: Debridement - Selective/Open Wound N/A N/A Debridement: Pre-procedure Verification/Time Out 14:45 N/A N/A Taken: Lidocaine 5% topical ointment N/A N/A Pain Control: Slough N/A N/A Tissue Debrided: Non-Viable Tissue N/A N/A Level: 7.06 N/A N/A Debridement A (sq cm): rea Curette N/A N/A Instrument: Minimum N/A N/A Bleeding: Pressure N/A N/A Hemostasis A chieved: Procedure was tolerated well N/A N/A Debridement Treatment Response: 4.5x2x0.2 N/A N/A Post Debridement Measurements L x W x D (cm) 1.414 N/A N/A Post  Debridement Volume: (cm) Category/Stage III N/A N/A Post Debridement Stage: Scarring: Yes N/A N/A Periwound Skin Texture: Excoriation: No Induration: No Callus: No Crepitus: No Rash: No Dry/Scaly: Yes N/A N/A Periwound Skin Moisture: Maceration: No Atrophie Blanche: No N/A N/A Periwound Skin Color: Cyanosis: No Ecchymosis: No Erythema: No Hemosiderin Staining: No Mottled: No Pallor: No Rubor: No No Abnormality N/A N/A Temperature: Yes N/A N/A Tenderness on Palpation: Debridement N/A N/A Procedures Performed: Treatment Notes Wound #2 (Calcaneus) Wound Laterality: Left Cleanser Normal Saline Discharge Instruction: Cleanse the wound with Normal Saline prior to applying a clean dressing using gauze sponges, not tissue or cotton balls. Wound Cleanser Discharge Instruction: Cleanse the wound with wound cleanser prior to applying a clean dressing using gauze sponges, not tissue or cotton balls. Peri-Wound Care Ketoconazole Cream 2% Discharge Instruction: Apply Ketoconazole mixed with zinc to the periwound [name], [name] ([id number]) [phone].pdf Page 4 of 8 Zinc Oxide Ointment 30g tube Discharge Instruction: Apply Zinc Oxide to periwound with each dressing change Topical Gentamicin Discharge Instruction: apply to the wound bed Primary Dressing Maxorb Extra Ag+ Alginate Dressing, 4x4.75 (in/in) Discharge Instruction: Apply to wound bed as instructed Secondary Dressing Drawtex 4x4 in Discharge Instruction: Apply over primary dressing as directed. Optifoam Non-Adhesive Dressing, 4x4 in Discharge Instruction: Apply over primary dressing as directed. Woven Gauze Sponge, Non-Sterile 4x4 in Discharge Instruction: Apply over primary dressing as directed. Secured With 39M Medipore H Soft Cloth Surgical T ape, 4 x 10 (in/yd) Discharge Instruction: Secure with tape as directed. Compression Wrap Kerlix Roll 4.5x3.1 (in/yd) Discharge Instruction: Apply  Kerlix and Coban compression as directed. Compression Stockings Add-Ons [place] V[employer] Offloading Boot, Size Regular Electronic Signature(s) Signed: 03/09/2023 3:07:36 PM By: [name] MD FACS Entered By: [name] on 03/09/2023 15:07:36 -------------------------------------------------------------------------------- Multi-Disciplinary Care Plan Details Patient Name: Date of Service: [name], TO [name] 03/09/2023 2:30 PM Medical Record Number: [id number] Patient Account Number: [id number] Date of Birth/Sex: Treating RN: June 04, 1976 (47 y.o. [name] Primary Care Makiah Clauson: Dorinda [name] Other Clinician: Referring Kanasia Gayman: Treating Iyahna Obriant/Extender: Jana Hakim in Treatment: 138 Multidisciplinary Care Plan reviewed with physician Active Inactive Wound/Skin Impairment Nursing Diagnoses: Impaired tissue integrity Knowledge deficit related to ulceration/compromised skin integrity Goals: Patient/caregiver will verbalize understanding of skin care regimen Date Initiated: 07/15/2020 Target Resolution Date: 05/08/2023 Goal Status: Active Ulcer/skin breakdown will have a volume reduction of 30% by week 4 Date Initiated: 07/15/2020  Date Inactivated: 08/20/2020 Target Resolution Date: 09/03/2020 Goal Status: Unmet Unmet Reason: no major changes. Ulcer/skin breakdown will heal within 14 weeks [name], [name] ([id number]) [id number] Page 5 of 8 Date Initiated: 12/04/2020 Date Inactivated: 12/10/2020 Target Resolution Date: 12/10/2020 Unmet Reason: wounds still open at 14 Goal Status: Unmet weeks and today 21 weeks. Interventions: Assess patient/caregiver ability to obtain necessary supplies Assess patient/caregiver ability to perform ulcer/skin care regimen upon admission and as needed Assess ulceration(s) every visit Provide education on ulcer and skin care Treatment Activities: Skin care regimen initiated :  07/15/2020 Topical wound management initiated : 07/15/2020 Notes: Electronic Signature(s) Signed: 03/09/2023 3:40:07 PM By: Redmond Pulling RN, BSN Entered By: Redmond Pulling on 03/09/2023 14:42:50 -------------------------------------------------------------------------------- Pain Assessment Details Patient Name: Date of Service: [name], [name] 03/09/2023 2:30 PM Medical Record Number: [id number] Patient Account Number: [id number] Date of Birth/Sex: Treating RN: [birth year] (47 y.o. [name] Primary Care Irasema Chalk: Dorinda [name] Other Clinician: Referring Johny Pitstick: Treating [name]/Extender: Jana Hakim in Treatment: 138 Active Problems Location of Pain Severity and Description of Pain Patient Has Paino No Site Locations Pain Management and Medication Current Pain Management: Electronic Signature(s) Signed: 03/09/2023 3:40:07 PM By: Redmond Pulling RN, BSN Entered By: Redmond Pulling on 03/09/2023 14:36:59 [name] ([id number]) [id number] Page 6 of 8 -------------------------------------------------------------------------------- Patient/Caregiver Education Details Patient Name: Date of Service: [name] 8/1/2024andnbsp2:30 PM Medical Record Number: [id number] Patient Account Number: [id number] Date of Birth/Gender: Treating RN: 1976-07-15 (48 y.o. [name] Primary Care Physician: Dorinda [name] Other Clinician: Referring Physician: Treating Physician/Extender: Jana Hakim in Treatment: 138 Education Assessment Education Provided To: Patient Education Topics Provided Wound/Skin Impairment: Methods: Explain/Verbal Responses: State content correctly [employer]) Signed: 03/09/2023 3:40:07 PM By: Redmond Pulling RN, BSN Entered By: Redmond Pulling on 03/09/2023 14:43:12 -------------------------------------------------------------------------------- Wound Assessment  Details Patient Name: Date of Service: [name], TO [name] 03/09/2023 2:30 PM Medical Record Number: [id number] Patient Account Number: [id number] Date of Birth/Sex: Treating RN: 29-Dec-1975 (47 y.o. [name] Primary Care Sabiha Sura: Dorinda [name] Other Clinician: Referring Dayanne Yiu: Treating [name]/Extender: Jana Hakim in Treatment: 138 Wound Status Wound Number: 2 Primary Etiology: Pressure Ulcer Wound Location: Left Calcaneus Wound Status: Open Wounding Event: Pressure Injury Comorbid History: Asthma, Angina, Hypertension Date Acquired: 10/07/2019 Weeks Of Treatment: 138 Clustered Wound: No Photos Wound Measurements Length: (cm) 4.5 Width: (cm) 2 [name], [name] E ([id number]) Depth: (cm) 0 Area: (cm) Volume: (cm) % Reduction in Area: 73.9% % Reduction in Volume: 94.8% [id number] Page 7 of 8 .2 Epithelialization: Small (1-33%) 7.069 Tunneling: No 1.414 Undermining: No Wound Description Classification: Category/Stage III Wound Margin: Distinct, outline attached Exudate Amount: Medium Exudate Type: Serosanguineous Exudate Color: red, brown Foul Odor After Cleansing: No Slough/Fibrino No Wound Bed Granulation Amount: Large (67-100%) Exposed Structure Granulation Quality: Pink Fascia Exposed: No Necrotic Amount: Small (1-33%) Fat Layer (Subcutaneous Tissue) Exposed: Yes Necrotic Quality: Adherent Slough Tendon Exposed: No Muscle Exposed: No Joint Exposed: No Bone Exposed: No Periwound Skin Texture Texture Color No Abnormalities Noted: No No Abnormalities Noted: No Callus: No Atrophie Blanche: No Crepitus: No Cyanosis: No Excoriation: No Ecchymosis: No Induration: No Erythema: No Rash: No Hemosiderin Staining: No Scarring: Yes Mottled: No Pallor: No Moisture Rubor: No No Abnormalities Noted: Yes Temperature / Pain Temperature: No Abnormality Tenderness on Palpation: Yes Treatment Notes Wound #2  (Calcaneus) Wound Laterality: Left Cleanser Normal Saline Discharge Instruction: Cleanse the wound with Normal Saline prior to applying  a clean dressing using gauze sponges, not tissue or cotton balls. Wound Cleanser Discharge Instruction: Cleanse the wound with wound cleanser prior to applying a clean dressing using gauze sponges, not tissue or cotton balls. Peri-Wound Care Ketoconazole Cream 2% Discharge Instruction: Apply Ketoconazole mixed with zinc to the periwound Zinc Oxide Ointment 30g tube Discharge Instruction: Apply Zinc Oxide to periwound with each dressing change Topical Gentamicin Discharge Instruction: apply to the wound bed Primary Dressing Maxorb Extra Ag+ Alginate Dressing, 4x4.75 (in/in) Discharge Instruction: Apply to wound bed as instructed Secondary Dressing Drawtex 4x4 in Discharge Instruction: Apply over primary dressing as directed. Optifoam Non-Adhesive Dressing, 4x4 in Discharge Instruction: Apply over primary dressing as directed. Woven Gauze Sponge, Non-Sterile 4x4 in Discharge Instruction: Apply over primary dressing as directed. Secured With 50M Medipore H Soft Cloth Surgical T ape, 4 x 10 (in/yd) Discharge Instruction: Secure with tape as directed. Compression [name], [name] ([id number]) [id number] Page 8 of 8 Kerlix Roll 4.5x3.1 (in/yd) Discharge Instruction: Apply Kerlix and Coban compression as directed. Compression Stockings Add-Ons [place] Vascular Offloading Boot, Size Regular Electronic Signature(s) Signed: 03/09/2023 3:40:07 PM By: Redmond Pulling RN, BSN Entered By: Redmond Pulling on 03/09/2023 14:41:33 -------------------------------------------------------------------------------- Vitals Details Patient Name: Date of Service: [name], [name] 03/09/2023 2:30 PM Medical Record Number: [id number] Patient Account Number: [id number] Date of Birth/Sex: Treating RN: 1976/03/18 (47 y.o. [name] Primary  Care [name] Other Clinician: Referring Duong Haydel: Treating [name]/Extender: Jana Hakim in Treatment: 138 Vital Signs Time Taken: 14:36 Temperature (F): 98.8 Height (in): 69 Pulse (bpm): 93 Weight (lbs): 280 Respiratory Rate (breaths/min): 18 Body Mass Index (BMI): 41.3 Blood Pressure (mmHg): 143/94 Reference Range: 80 - 120 mg / dl Electronic Signature(s) Signed: 03/09/2023 3:40:07 PM By: Redmond Pulling RN, BSN Entered By: Redmond Pulling on 03/09/2023 14:36:47

## 2023-03-09 NOTE — Progress Notes (Signed)
[name], [name] ([id number]) [id number] Page 1 of 17 Visit Report for 03/09/2023 Chief Complaint Document Details Patient Name: Date of Service: [name], TO [name] 03/09/2023 2:30 PM Medical Record Number: [id number] Patient Account Number: [id number] Date of Birth/Sex: Treating RN: [birth year] (47 y.o. M) Primary Care Provider: Dorinda Hill Other Clinician: Referring Provider: Treating Provider/Extender: Jana Hakim in Treatment: 138 Information Obtained from: Patient Chief Complaint Bilateral Plantar Foot Ulcers Electronic Signature(s) Signed: 03/09/2023 3:07:44 PM By: [name] MD FACS Entered By: [name] on 03/09/2023 15:07:44 -------------------------------------------------------------------------------- Debridement Details Patient Name: Date of Service: [name], [name] 03/09/2023 2:30 PM Medical Record Number: [id number] Patient Account Number: [id number] Date of Birth/Sex: Treating RN: 03-Feb-1976 (47 y.o. Cline Cools Primary Care Provider: Dorinda Hill Other Clinician: Referring Provider: Treating Provider/Extender: Jana Hakim in Treatment: 138 Debridement Performed for Assessment: Wound #2 Left Calcaneus Performed By: Physician [name], MD Debridement Type: Debridement Level of Consciousness (Pre-procedure): Awake and Alert Pre-procedure Verification/Time Out Yes - 14:45 Taken: Start Time: 14:51 Pain Control: Lidocaine 5% topical ointment Percent of Wound Bed Debrided: 100% T Area Debrided (cm): otal 7.06 Tissue and other material debrided: Non-Viable, Slough, Slough Level: Non-Viable Tissue Debridement Description: Selective/Open Wound Instrument: Curette Bleeding: Minimum Hemostasis Achieved: Pressure Response to Treatment: Procedure was tolerated well Level of Consciousness (Post- Awake and Alert procedure): Post Debridement Measurements of Total Wound Length:  (cm) 4.5 Stage: Category/Stage III Width: (cm) 2 Depth: (cm) 0.2 Volume: (cm) 1.414 Character of Wound/Ulcer Post Debridement: Improved Post Procedure Diagnosis Same as [name], [name] ([id number]) [id number] Page 2 of 17 Notes Scribed for Dr Lady Gary by Redmond Pulling, RN Electronic Signature(s) Signed: 03/09/2023 3:16:59 PM By: [name] MD FACS Signed: 03/09/2023 3:40:07 PM By: Redmond Pulling RN, BSN Entered By: Redmond Pulling on 03/09/2023 14:52:40 -------------------------------------------------------------------------------- HPI Details Patient Name: Date of Service: [name], [name] 03/09/2023 2:30 PM Medical Record Number: [id number] Patient Account Number: [id number] Date of Birth/Sex: Treating RN: 03-08-1976 (47 y.o. M) Primary Care Provider: Dorinda Hill Other Clinician: Referring Provider: Treating Provider/Extender: Jana Hakim in Treatment: 138 History of Present Illness HPI Description: Wounds are12/03/2020 upon evaluation today patient presents for initial inspection here in our clinic concerning issues he has been having with the bottoms of his feet bilaterally. He states these actually occurred as wounds when he was hospitalized for 5 months secondary to Covid. He was apparently with tilting bed where he was in an upright position quite frequently and apparently this occurred in some way shape or form during that time. Fortunately there is no sign of active infection at this time. No fevers, chills, nausea, vomiting, or diarrhea. With that being said he still has substantial wounds on the plantar aspects of his feet Theragen require quite a bit of work to get these to heal. He has been using Santyl currently though that is been problematic both in receiving the medication as well as actually paid for it as it is become quite expensive. Prior to the experience with Covid the patient really did not  have any major medical problems other than hypertension he does have some mild generalized weakness following the Covid experience. 07/22/2020 on evaluation today patient appears to be doing okay in regard to his foot ulcers I feel like the wound beds are showing signs of better improvement that I do believe the Iodoflex is helping in this regard. With that being said he does have a lot  of drainage currently and this is somewhat blue/green in nature which is consistent with Pseudomonas. I do think a culture today would be appropriate for [place] to evaluate and see if that is indeed the case I would likely start him on antibiotic orally as well he is not allergic to Cipro knows of no issues he has had in the past 12/21; patient was admitted to the clinic earlier this month with bilateral presumed pressure ulcers on the bottom of his feet apparently related to excessive pressure from a tilt table arrangement in the intensive care unit. Patient relates this to being on ECMO but I am not really sure that is exactly related to that. I must say I have never seen anything like this. He has fairly extensive full-thickness wounds extending from his heel towards his midfoot mostly centered laterally. There is already been some healing distally. He does not appear to have an arterial issue. He has been using gentamicin to the wound surfaces with Iodoflex to help with ongoing debridement 1/6; this is a patient with pressure ulcers on the bottom of his feet related to excessive pressure from a standing position in the intensive care unit. He is complaining of a lot of pain in the right heel. He is not a diabetic. 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 We have been using Iodoflex to help prepare the surfaces of both wounds for an advanced treatment product. He is nonambulatory spending most of his time in a wheelchair I have asked him not to propel the wheelchair with his heels 1/13; in  general his wounds look better not much surface area change we have been using Iodoflex as of last week. I did an x-ray of the right heel as the patient was complaining of pain. I had some thoughts about a stress fracture perhaps Achilles tendon problems however what it showed was erosive changes along the inferior aspect of the calcaneus he now has a MRI booked for 1/20. 1/20; in general his wounds continue to be better. Some improvement in the large narrow areas proximally in his foot. He is still complaining of pain in the right heel and tenderness in certain areas of this wound. His MRI is tonight. I am not just looking for osteomyelitis that was brought up on the x-ray I am wondering about stress fractures, tendon ruptures etc. He has no such findings on the left. Also noteworthy is that the patient had critical illness neuropathy and some of the discomfort may be actual improvement in nerve function I am just not sure. These wounds were initially in the setting of severe critical illness related to COVID-19. He was put in a standing position. He may have also been on pressors at the point contributing to tissue ischemia. By his description at some point these wounds were grossly necrotic extending proximally up into the Achilles part of his heel. I do not know that I have ever really seen pictures of them like this although they may exist in epic We have ordered Tri layer Oasis. I am trying to stimulate some granulation in these areas. This is of course assuming the MRI is negative for infection 1/27; since the patient was last here he saw Dr. Earlene Plater of infectious disease. He is planned for vancomycin and ceftriaxone. Prior operative culture grew MSSA. Also ordered baseline lab work. He also ordered arterial studies although the ABIs in our clinic were normal as well as his clinical exam these were normal I do  not think he needs to see vascular surgery. His ABIs at the [facility] were 1.22 in the right  triphasic waveforms with a normal TBI of 1.15 on the left ABI of 1.22 with triphasic waveforms and a normal TBI of 1.08. Finally he saw Dr. Logan Bores who will follow him in for 2 months. At this point I do not think he felt that he needed a procedure on the right calcaneal bone. Dr. Earlene Plater is elected for broad-spectrum antibiotic The patient is still having pain in the right heel. He walks with a walker 2/3; wounds are generally smaller. He is tolerating his IV antibiotics. I believe this is vancomycin and ceftriaxone. We are still waiting for Oasis burn in terms of his out-of-pocket max which he should be meeting soon given the IV antibiotics, MRIs etc. I have asked him to check in on this. We are using silver collagen in the meantime the wounds look better 2/10; tolerating IV vancomycin and Rocephin. We are waiting to apply for Oasis. Although I am not really sure where he is in his out-of-pocket max. 2/17 started the first application of Oasis trilayer. Still on antibiotics. The wounds have generally look better. The area on the left has a little more surface slough requiring debridement 2/24; second application of Oasis trilayer. The wound surface granulation is generally look better. The area on the left with undermining laterally I think is come in a bit. IZIAHA, MAHOOD ([id number]) [id number] Page 3 of 17 10/08/2020 upon evaluation today patient is here today for [employer] application #3. Fortunately he seems to be doing extremely well with regard to this and we are seeing a lot of new epithelial growth which is great news. Fortunately there is no signs of active infection at this time. 10/16/2020 upon evaluation today patient appears to be doing well with regard to his foot ulcers. Do believe the Oasis has been of benefit for him. I do not see any signs of infection right now which is great news and I think that he has a lot of new epithelial growth which is great  to see as well. The patient is very pleased to hear all of this. I do think we can proceed with the Oasis trilayer #4 today. 3/18; not as much improvement in these areas on his heels that I was hoping. I did reapply trilateral Oasis today the tissue looks healthier but not as much fill in as I was hoping. 3/25; better looking today I think this is come in a bit the tissue looks healthier. Triple layer Oasis reapplied #6 4/1; somewhat better looking definitely better looking surface not as much change in surface area as I was hoping. He may be spending more time [place] on days then he needs to although he does have heel offloading boots. Triple layer Oasis reapplied #7 4/7; unfortunately apparently [facility] will not approve any further Oasis which is unfortunate since the patient did respond nicely both in terms of the condition of the wound bed as well as surface area. There is still some drainage coming from the wound but not a lot there does not appear to be any infection 4/15; we have been using Hydrofera Blue. He continues to have nice rims of epithelialization on the right greater than the left. The left the epithelialization is coming from the tip of his heel. There is moderate drainage. In this that concerns me about a total contact cast. There is no evidence of infection 4/29; patient  has been using Hydrofera Blue with dressing changes. He has no complaints or issues today. 5/5; using Hydrofera Blue. I actually think that he looks marginally better than the last time I saw this 3 weeks ago. There are rims of epithelialization on the left thumb coming from the medial side on the right. Using Hydrofera Blue 5/12; using Hydrofera Blue. These continue to make improvements in surface area. His drainage was not listed as severe I therefore went ahead and put a cast on the left foot. Right foot we will continue to dress his previous 5/16; back for first total contact cast change. He  did not tolerate this particularly well cast injury on the anterior tibia among other issues. Difficulty sleeping. I talked him about this in some detail and afterwards is elected to continue. I told him I would like to have a cast on for 3 weeks to see if this is going to help at all. I think he agreed 5/19; I think the wound is better. There is no tunneling towards his midfoot. The undermining medially also looks better. He has a rim of new skin distally. I think we are making progress here. The area on the left also continues to look somewhat better to me using Hydrofera Blue. He has a list of complaints about the cast but none of them seem serious 5/26; patient presents for 1 week follow-up. He has been using a total contact cast and tolerating this well. Hydrofera Blue is the main dressing used. He denies signs of infection. 6/2 Hydrofera Blue total contact cast on the left. These were large ulcers that formed in intensive care unit where the patient was recovering from COVID. May have had something to do with being ventilated in an upright positiono Pressors etc. We have been able to get the areas down considerably and a viable surface. There is some epithelialization in both sides. Note made of drainage 6/9; changed to [facility] last time because of drainage. He arrives with better looking surfaces and dimensions on the left than the right. Paradoxically the right actually probes more towards his midfoot the left is largely close down but both of these look improved. Using a total contact cast on the left 6/16; complex wounds on his bilateral plantar heels which were initially pressure injury from a stay in the ICU with COVID. We have been using silver alginate most recently. His dimensions of come in quite dramatically however not recently. We have been putting the left foot in a total contact cast 6/23; complex wounds on the bilateral plantar heels. I been putting the left in the cast  paradoxically the area on the right is the one that is going towards closure at a faster rate. Quite a bit of drainage on the left. The patient went to see Dr. Logan Bores who said he was going to standby for skin grafts. I had actually considered sending him for skin grafts however he would be mandatorily off his feet for a period of weeks to months. I am thinking that the area on the right is going to close on its own the area on the left has been more stubborn even though we have him in a total contact cast 6/30; took him out of a total contact cast last week is the right heel seem to be making better progress than the left where I was placing the cast. We are using silver alginate. Both wounds are smaller right greater than left 7/12; both wounds look as  though they are making some progress. We are using silver alginate. Heel offloading boots 7/26; very gradual progress especially on the right. Using silver alginate. He is wearing heel offloading boots 8/18; he continues to close these wounds down very gradually. Using silver alginate. The problem polymen being definitive about this is areas of what appears to be callus around the margins. This is not a 100% of the area but certainly sizable especially on the right 9/1; bilateral plantar feet wounds secondary to prolonged pressure while being ventilated for COVID-19 in an upright position. Essentially pressure ulcers on the bottom of his feet. He is made substantial progress using silver alginate. 9/14; bilateral plantar feet wounds secondary to prolonged pressure. Making progress using silver alginate. 9/29 bilateral plantar feet wounds secondary to prolonged pressure. I changed him to Iodoflex last week. MolecuLight showing reddened blush fluorescence 10/11; patient presents for follow-up. He has no issues or complaints today. He denies signs of infection. He continues to use Iodoflex and antibiotic ointment to the wound beds. 10/27; 2-week  follow-up. No evidence of infection. He has callus and thick dry skin around the wound margins we have been using Iodoflex and Bactroban which was in response to a moderate left MolecuLight reddish blush fluorescence. 11/10; 2-week follow-up. Wound margins again have thick callus however the measurements of the actual wound sites are a lot smaller. Everything looks reasonably healthy here. We have been using Iodoflex He was approved for prime matrix but I have elected to delay this given the improvement in the surface area. Hopefully I will not regret that decision as were getting close to the end of the year in terms of insurance payment 12/8; 2-week follow-up. Wounds are generally smaller in size. These were initially substantial wounds extending into the forefoot all the way into the heel on the bilateral plantar feet. They are now both located on the plantar heel distal aspect both of these have a lot of callus around the wounds I used a #5 curette to remove this on the right and the left also some subcutaneous debris to try and get the wound edges were using Iodoflex. He has heel offloading shoe 12/22; 2-week follow-up. Not really much improvement. He has thick callus around the outer edges of both wounds. I remove this there is some nonviable subcutaneous tissue as well. We have been using Iodoflex. Her intake nurse and myself spontaneously thought of a total contact cast I went back in May. At that time we really were not seeing much of an improvement with a cast although the wound was in a much different situation I would like to retry this in 2 weeks and I discussed this with the patient 08/12/2021; the patient has had some improvement with the Iodoflex. The the area on the left heel plantar more improved than the right. I had to put him in a total contact cast on the left although I decided to put that off for 2 weeks. I am going to change his primary dressing to silver collagen. I think in  both areas he has had some improvement most of the healing seems to be more proximal in the heel. The wounds are in the mid aspect. A lot of thick callus on the right heel however. 1/19; we are using silver collagen on both plantar heel areas. He has had some improvement today. The left did not require any debridement. He still had some eschar on the right that was debrided but both seem to have contracted.  I did not put it total contact cast on him today 2/2 we have been using silver collagen. The area on the right plantar heel has areas that appear to be epithelialized interspersed with dry flaking callus and dry skin. I removed this. This really looks better than on the other side. On the left still a large area with raised edges and debris on the surface. 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 He has no issues or complaints today. 2/9; not much change in the left foot wound with 1 week of a cast we are using silver collagen. Silver collagen on the right side. The right side has been the better wound surface. We will reapply the total contact cast on the left 2/16; not much improvement on either side I been using silver collagen with a total contact cast on the left. I'm changing the Hydrofera Blue still with a total contact cast on the left 2/23; some improvement on both sides. Disappointing that he has thick callus around the area that we are putting in a total contact cast on the left. We've been using Hydrofera Blue on both wound areas. RENALDO, WORTHMAN ([id number]) [id number] Page 4 of 17 This is a man who at essentially pressure ulcers in addition to ischemia caused by medications to support his blood pressure (pressors) in the ICU. He was being ventilated in the standing position for severe Covid. A Shiley the wounds extended  across his entire foot but are now localized to his plantar heels bilaterally. We have made progress however neither areas healed. I continue to think the total contact cast is helped albeit painstakingly slowly. He has never wanted a plastic surgery consult although I don't know that they would be interested in grafting in area in this location. 10/07/2021: Continued improvement bilaterally. There is still some callus around the left wound, despite the total contact cast. He has some increased pain in his right midfoot around 1 particular area. This has been painful in the past but seems to be a little bit worse. When his cast was removed today, there was an area on the heel of the left foot that looks a bit macerated. He is also complaining of pain in his left thigh and hip which he thinks is secondary to the limb length discrepancy caused by the cast. 10/14/2021: He continues to improve. A little bit less callus around the left wound. He continues to endorse pain in his right midfoot, but this is not as significant as it was last week. The maceration on his left heel is improved. 10/21/2021: Continued improvement to both wounds. The maceration on his left heel is no longer evident. Less callus bilaterally. Epithelialization progressing. 10/28/2021: Significant improvement this week. The right sided wound is nearly closed with just a small open area at the middle. No maceration seen on the left heel. Continued epithelialization on both sides. No concern for infection. 11/04/2021: T oday, the wounds were measured a little bit differently and come out as larger, but I actually think they are about the same to potentially even smaller, particularly on the left. He continues to accumulate some callus on the right. 11/11/2021: T oday, the patient is expressing some concern that the left wound, despite being in the total contact cast, is not progressing at the same rate as the 1 on the right. He is  interested in  trying a week without the cast to see how the wound does. The wounds are roughly the same size as last week, with the right perhaps being a little bit smaller. He continues to build up callus on both sites. 11/18/2021: Last week, I permitted the patient to go without his total contact cast, just to see if the cast was really making any difference. Today, both wounds have deteriorated to some extent, suggesting that the cast is providing benefit, at least on the left. Both are larger and have accumulated callus, slough, and other debris. 11/26/2021: I debrided both wounds quite aggressively last week in an effort to stimulate the healing cascade. This appears to have been effective as the left sided wound is a full centimeter shorter in length. Although the right was measured slightly larger, on inspection, it looks as though an area of epithelialized tissue was included in the measurements. We have been using PolyMem Ag on the wound surfaces with a total contact cast to the left. 12/02/2021: It appears that the intake personnel are including epithelialized tissue in his wound measurements; the right wound is almost completely epithelialized; there is just a crater at the proximal midfoot with some open areas. On the left, he has built up some callus, but the overall wound surface looks good. There is some senescent skin around the wound margin. He has been in PolyMem Ag bilaterally with a total contact cast on the left. 12/09/2021: The right wound is nearly closed; there is just a small open area at the mid calcaneus. On the left, the wound is smaller with minimal callus buildup. No significant drainage. 12/16/2021: The right calcaneal wound remains minimally open at the mid calcaneus; the rest has epithelialized. On the left, the wound is also a little bit smaller. There is some senescent tissue on the periphery. He is getting his first application of a trial skin substitute called Vendaje  today. 12/23/2021: The wound on his right calcaneus is nearly closed; there is just a small area at the most distal aspect of the calcaneus that is open. On the left, the area where we applied to the skin substitute has a healthier-looking bed of granulation tissue. The wound dimensions are not significantly different on this side but the wound surface is improved. 12/30/2021: The wound on the right calcaneus has not changed significantly aside from some accumulation of callus. On the left, the open area is smaller and continues to have an improved surface. He continues to accumulate callus around the wound. He is here for his third application of Vendaje. 01/06/2022: The right calcaneal wound is down to just a couple of millimeters. He continues to accumulate periwound callus. He unfortunately got his cast wet earlier in the week and his left foot is macerated, resulting in some superficial skin loss just distal to the open wound. The open wound itself, however, is much smaller and has a healthier appearing surface. He is here for his fourth application of Vendaje. 01/13/2022: The right calcaneal wound is about the same. Unfortunately, once again, his cast got wet and his foot is again macerated. This is caused the left calcaneal wound to enlarge. He is here for his fifth application of Vendaje. 01/20/2022: The right calcaneal wound is very small. There is some periwound callus accumulation. He purchased a new cast protector last week and this has been effective in avoiding the maceration that has been occurring on the left. The left calcaneal wound is narrower and has a  healthy and viable-appearing surface. He is here for his 6 application of Vendaje. 01/27/2022: The right calcaneal wound is down to just a pinhole. There is some periwound slough and callus. On the left, the wound is narrower and shorter by about a centimeter. The surface is robust and viable-appearing. Unfortunately, the rep for the trial  skin substitute product did not provide any for [place] to use today. 02/04/2022: The right calcaneal wound remains unchanged. There is more accumulated callus. On the left, although the intake nurse measured it a little bit longer, it looks about the same to me. The surface has a layer of slough, but underneath this, there is good granulation tissue. 02/10/2022: The right calcaneus wound is nearly closed. There is still some callus that builds up around the site. The left side looks about the same in terms of dimensions, but the surface is more robust and vital-appearing. 02/16/2022: The area of the right calcaneus that was nearly closed last week has closed, but there is a small opening at the mid foot where it looks like some moisture got retained and caused some reopening. The left foot wound is narrower and shallower. Both sites have a fair amount of periwound callus and eschar. 02/24/2022: The small midfoot opening on the right calcaneus is a little bit smaller today. The left foot wound is narrower and shallower. He continues to accumulate periwound callus. No concern for infection. 03/01/2022: The patient came to clinic early because he showered and got his cast wet. Fortunately, there is no significant maceration to his foot but the callus softened and it looks like the wound on his left calcaneus may be a little bit wider. The wound on his right calcaneus is just a narrow slit. Continued accumulation of periwound callus bilaterally. 03/08/2022: The wound on his right calcaneus is very nearly closed, just a small pinpoint opening under a bit of eschar; the left wound has come in quite a bit, as well. It is narrower and shorter than at our last visit. Still with accumulated callus and eschar bilaterally. 03/17/2022: The right calcaneal wound is healed. The left wound is smaller and the surface itself is very clean, but there is some blue-green staining on the periwound callus, concerning for Pseudomonas  aeruginosa. 03/23/2022: The right calcaneal wound remains closed. The left wound continues to contract. No further blue-green staining. Small amount of callus and slough accumulation. 03/28/2022: He came in early today because he had gotten his cast wet. On inspection, the wound itself did not get wet or macerated, just a little bit of the forefoot. The wound itself is basically unchanged. ROSIO, STERNER ([id number]) [id number] Page 5 of 17 04/07/2022: The right foot wound remains closed. The left wound is the smallest that I have seen it to date. It is narrower and shorter. It still continues to accumulate slough on the surface. 04/15/2022: There is a band of epithelium now dividing the small left plantar foot wound in 2. There is still some slough on the surface. 04/21/2022: The wound continues to narrow. Just a little bit of slough on the surface. He seems to be responding well to endoform. 04/28/2022: Continued slow contraction of the wound. There is a little slough on the surface and some periwound callus. We have been using endoform and total contact cast. 05/05/2022: The wound appears to have stalled. There is slough and some periwound eschar/callus. No concern for infection, however. 05/12/2022: Unfortunately, his right foot has reopened. It is located at the most posterior  aspect of his surgical incision. The area was noted to have drainage coming from it when his padding was removed today. Underneath some callus and senescent skin, there is an opening. No purulent drainage or malodor. On the left foot, the wound is again unchanged. There is some light blue staining on the callus, but no malodor or purulent drainage. 10/13; right and left heel remanence of extensive plantar foot wounds. These are better than I remember by quite a big margin however he is still left with wounds on the left plantar heel and the right plantar heel. Been using endoform bilaterally. A culture  was done that showed apparently Pseudomonas but we are still waiting for the [facility] antibiotic to use gentamicin today. He is still very active by description I am not sure about the offloading of his noncasted right foot 10/20; both wounds right and left heel debrided not much change from last week. [name] Echevaria has arrived which is linezolid, gentamicin and ciprofloxacin we will use this with endoform. T contact cast on the left otal 06/02/2022: Both wounds are smaller today. There is still a fair amount of callus buildup around the right foot ulcer. The left is more superficial and nearly flush with the surrounding tissues. Also with slough and eschar buildup. 06/10/2022: The right sided wound appears to be nearly closed, if not completely so, although it is somewhat difficult to tell given the abnormal tissue and scarring in his foot. There is a fair amount of callus and crust accumulation. On the left, the wound looks about the same, again with callus and slough. He has an appointment next Thursday with Dr. Annamary Rummage in podiatry; I am hopeful that there may be some reconstructive options available for Mr. Kriel. 06/16/2022: Both wounds have some eschar and callus accumulation. The right sided wound is extremely narrow and barely open; the left is narrower than last week. There is a little bit of slough. He has his appointment in podiatry later today. 06/23/2022: The patient met with Dr. Annamary Rummage last week and unfortunately, there are no reconstructive options that he believes would be helpful. He did order an MRI to evaluate for osteomyelitis and fortunately, none was seen. The left sided wound is a little bit shorter and narrower today. The right sided wound is about the same. There is callus and eschar accumulation bilaterally. 06/29/2022: Both feet have improved from last week. There is epithelium making a valiant effort to creep across the surface on the left. The right side looks like it  got a little dry and the deep crevasse in his midfoot has cracked. Both have eschar and there is some slough on the left. 07/07/2022: Both feet have improved. There is epithelium completely covering the calcaneus on the right with just a small opening in the crevasse in his midfoot. On the left, the open area of tissue is smaller but he continues to build up callus/eschar and slough. 07/15/2022: The opening in the midfoot on the right is about the same size, covered with eschar and a little bit of slough. The open portion of the left wound is narrower and shorter with a bit of slough buildup. He admits to being on his feet more than recommended. 12/14; as far as I can tell everything on the right foot is closed. There is some eschar I removed some of this I cannot identify any open wound here. As usual this will be a very vulnerable area going forward. On the left this looks really quite healthy. I  was pleasantly surprised to see how good this looked. Wound is certainly smaller and there appears to be healthy epithelialization. He has been using Promogran on the right and endoform on the left. He has been offloading the right foot with a heel offloading boot and he has a running shoe on the right foot 08/04/2022: The right foot remains closed. He has a thick cushioned insole in his sneaker. The left sided wound is smaller with just some slough and eschar accumulation. He is wearing the heel off loader on this foot. 08/15/2022: The right foot remains closed. The left wound has narrowed further. There is some slough and eschar accumulation. 08/25/2022: We put him in a peg assist shoe insert and as a result, he has more epithelialization of the ulcer with minimal slough and eschar accumulation. 09/01/2022: The wound is smaller by about half this week. Still with some slough on the surface. The peg assist shoe seems to be doing a remarkable job of adequately offloading the site. 09/08/2022: There is a little  bit more epithelium coming in. There is some slough and callus buildup. 09/16/2022: The wound measures about the same size, but the epithelium that has grown in looks more robust and stronger. There is some slough and callus buildup. 09/23/2022: The wound remains about the same size. The skin edges are looking rather senescent. 09/29/2022: The aggressive debridement I performed last week seems to have been effective. The wound is smaller and has significantly less slough accumulation. 10/06/2022: There has been quite a bit of epithelialization since last week. There are still some open areas with slough accumulation. There is some callus buildup around the perimeter. 10/13/2022: Continued improvement. Minimal callus accumulation. 3/14; patient presents for follow-up. He has been using endoform to the wound bed without issues. He is using a surgical shoe with peg assist for offloading. 10/27/2022: The wound dimensions are stable. There is some senescent skin accumulation around the perimeter. 11/04/2022: Last week I performed a very aggressive debridement in an effort to stimulate the healing cascade. As has been the case when I have done this before, the wound has responded well. There has been epithelialization and contraction of the wound. There are just a couple of small open areas. 11/10/2022: Unfortunately, his foot got wet secondary to sweating and there has been some breakdown of the tissue, particularly the skin just adjacent to the main ulcer. 11/18/2022: We continue to struggle with moisture-related tissue breakdown around the perimeter of his wound. This has caused the thin epithelium that had formed on the surface to disintegrate. The underlying surface of the wound has good granulation tissue. OVAL, LITTREL ([id number]) [id number] Page 6 of 17 11/25/2022: The edges of the wound are much less macerated, but the surface is actually looking a little bit dry. There is  slough accumulation. No obvious signs of infection. 12/01/2022: The wound edges are more macerated and broken down, making the wound larger. The surface has some slough on it. 12/08/2020: The wound looks better this week. There is significantly less maceration and moisture-related tissue breakdown. There is some slough on the wound surface. 12/15/2022: The wound continues to improve. There is almost no moisture-related tissue breakdown. There is epithelium beginning to fill in again from the edges. Light slough on the wound surface. 12/22/2022: More epithelium has filled him from around the edges. There is some slough on the surface. No evidence of moisture-related tissue breakdown. 01/05/2023: There has been more epithelialization, particularly at the posterior calcaneal aspect of the  wound. There has been a little bit of moisture accumulation with minimal maceration. 01/12/2023: More epithelium has filled in. There is very minimal accumulation of slough. 01/20/2023: The wound is measuring a little bit smaller today. It did measure deeper, but this appears to be secondary to some heaped up senescent skin around the edges. The epithelium that has been present at the posterior aspect of the wound seems to be a bit more robust with greater integrity. 01/26/2023: He reports intense itching to the skin around the wound and there has been some breakdown here. It looks fungal in nature. The wound itself looks pretty good with improved epithelialization with just some slough buildup. He has a little callus along the wound edges. 02/02/2023: He has been treating the periwound skin with an over-the-counter athlete's foot medication and notes significant improvement in his pruritus. The skin looks better here, as well. The wound continues to epithelialize. There is light slough accumulation. 02/10/2023: The periwound skin still looks fairly angry. He reports multiple small blisters that have opened up. He is not having the  intense itching that he had prior, however. There has been more epithelialization at the calcaneus and there is minimal slough accumulation. 02/16/2023: The periwound skin is improving. An online review of dermatology rash images suggest that this may be hand-foot-and-mouth disease, isolated to the feet; the patient says he actually had this as a child. The calcaneus continues to epithelialize and the tissue was quite robust here. Very minimal slough accumulation on the open portion of the wound with a little bit of periwound callus buildup. 03/03/2023: The wound measurements are about the same, but there is more epithelium making its way to the surface. There is a little bit of crust and eschar around the edges and minimal slough on the surface. 03/09/2023: His dressing was applied rather haphazardly and the drawtex was not applied. The Optifoam was also reversed such that the orange side was in contact with his bare skin. He has some maceration around the wound edges, but this did not appear to result in any tissue breakdown. There is a little bit of slough on the surface and minimal eschar around the edges. Electronic Signature(s) Signed: 03/09/2023 3:09:07 PM By: [name] MD FACS Entered By: [name] on 03/09/2023 15:09:07 -------------------------------------------------------------------------------- Physical Exam Details Patient Name: Date of Service: [name], TO [name] 03/09/2023 2:30 PM Medical Record Number: [id number] Patient Account Number: [id number] Date of Birth/Sex: Treating RN: 25-Sep-1975 (47 y.o. M) Primary Care Provider: Dorinda Hill Other Clinician: Referring Provider: Treating Provider/Extender: Jana Hakim in Treatment: 138 Constitutional Slightly hypertensive. . . . no acute distress. Respiratory Normal work of breathing on room air. Notes 03/09/2023: He has some maceration around the wound edges, but this did not appear to result in any  tissue breakdown. There is a little bit of slough on the surface and minimal eschar around the edges. Electronic Signature(s) Signed: 03/09/2023 3:10:17 PM By: [name] MD FACS Entered By: [name] on 03/09/2023 15:10:16 [name] ([id number]) [id number] Page 7 of 17 -------------------------------------------------------------------------------- Physician Orders Details Patient Name: Date of Service: [name] 03/09/2023 2:30 PM Medical Record Number: [id number] Patient Account Number: [id number] Date of Birth/Sex: Treating RN: Dec 18, 1975 (47 y.o. Cline Cools Primary Care Provider: Dorinda Hill Other Clinician: Referring Provider: Treating Provider/Extender: Jana Hakim in Treatment: [id number] Verbal / Phone Orders: No Diagnosis Coding ICD-10 Coding Code Description [phone] Non-pressure chronic ulcer of other part of  left foot with fat layer exposed Follow-up Appointments ppointment in 1 week. - Dr. Lady Gary Room 3 03/15/23 @ 0930 Return A Anesthetic Wound #2 Left Calcaneus (In clinic) Topical Lidocaine 4% applied to wound bed - In clinic Bathing/ Shower/ Hygiene May shower and wash wound with soap and water. Edema Control - Lymphedema / SCD / Other Bilateral Lower Extremities Avoid standing for long periods of time. Moisturize legs daily. - as needed Off-Loading Other: - keep pressure off of the bottom of your feet. Elevate legs throughout the day. Use the Shoe with the PegAssist off-loading insole Additional Orders / Instructions Follow Nutritious Diet - Try to get 70-100 grams of Protein a day+ Wound Treatment Wound #2 - Calcaneus Wound Laterality: Left Cleanser: Normal Saline (Generic) 1 x Per Day/30 Days Discharge Instructions: Cleanse the wound with Normal Saline prior to applying a clean dressing using gauze sponges, not tissue or cotton balls. Cleanser: Wound Cleanser (Generic) 1 x Per Day/30  Days Discharge Instructions: Cleanse the wound with wound cleanser prior to applying a clean dressing using gauze sponges, not tissue or cotton balls. Peri-Wound Care: Ketoconazole Cream 2% 1 x Per Day/30 Days Discharge Instructions: Apply Ketoconazole mixed with zinc to the periwound Peri-Wound Care: Zinc Oxide Ointment 30g tube 1 x Per Day/30 Days Discharge Instructions: Apply Zinc Oxide to periwound with each dressing change Topical: Gentamicin (Generic) 1 x Per Day/30 Days Discharge Instructions: apply to the wound bed Prim Dressing: Maxorb Extra Ag+ Alginate Dressing, 4x4.75 (in/in) (Generic) 1 x Per Day/30 Days ary Discharge Instructions: Apply to wound bed as instructed Secondary Dressing: Drawtex 4x4 in (Generic) 1 x Per Day/30 Days Discharge Instructions: Apply over primary dressing as directed. Secondary Dressing: Optifoam Non-Adhesive Dressing, 4x4 in (Generic) 1 x Per Day/30 Days Discharge Instructions: Apply over primary dressing as directed. Secondary Dressing: Woven Gauze Sponge, Non-Sterile 4x4 in (Generic) 1 x Per Day/30 Days Discharge Instructions: Apply over primary dressing as directed. Secured With: 65M Medipore H Soft Cloth Surgical T ape, 4 x 10 (in/yd) (Generic) 1 x Per Day/30 Days Discharge Instructions: Secure with tape as directed. NIEVES, JESSEE ([id number]) [id number] Page 8 of 17 Compression Wrap: Kerlix Roll 4.5x3.1 (in/yd) (Generic) 1 x Per Day/30 Days Discharge Instructions: Apply Kerlix and Coban compression as directed. Add-Ons: [employer] Offloading Boot, Size Regular 1 x Per Day/30 Days Patient Medications llergies: No Known Drug Allergies A Notifications Medication Indication Start End 03/09/2023 lidocaine DOSE topical 5 % ointment - ointment topical once daily Electronic Signature(s) Signed: 03/09/2023 3:16:59 PM By: [name] MD FACS Entered By: [name] on 03/09/2023  15:10:36 -------------------------------------------------------------------------------- Problem List Details Patient Name: Date of Service: [name], [name] 03/09/2023 2:30 PM Medical Record Number: [id number] Patient Account Number: [id number] Date of Birth/Sex: Treating RN: 1976/06/08 (47 y.o. M) Primary Care Provider: Dorinda Hill Other Clinician: Referring Provider: Treating Provider/Extender: Jana Hakim in Treatment: 138 Active Problems ICD-10 Encounter Code Description Active Date MDM Diagnosis L97.522 Non-pressure chronic ulcer of other part of left foot with fat layer exposed 09/03/2020 No Yes Inactive Problems ICD-10 Code Description Active Date Inactive Date L97.512 Non-pressure chronic ulcer of other part of right foot with fat layer exposed 09/03/2020 09/03/2020 L89.893 Pressure ulcer of other site, stage 3 07/15/2020 07/15/2020 M62.81 Muscle weakness (generalized) 07/15/2020 07/15/2020 I10 Essential (primary) hypertension 07/15/2020 07/15/2020 M86.171 Other acute osteomyelitis, right ankle and foot 09/03/2020 09/03/2020 Resolved Problems Electronic Signature(s) Signed: 03/09/2023 3:07:30 PM By: [name] MD FACS Entered By: [name]  on 03/09/2023 15:07:30 JOHNLEE, MATSUYAMA ([id number]) [id number] Page 9 of 17 -------------------------------------------------------------------------------- Progress Note Details Patient Name: Date of Service: [name], TO [name] 03/09/2023 2:30 PM Medical Record Number: [id number] Patient Account Number: [id number] Date of Birth/Sex: Treating RN: 11/13/1975 (47 y.o. M) Primary Care Provider: Dorinda Hill Other Clinician: Referring Provider: Treating Provider/Extender: Jana Hakim in Treatment: 138 Subjective Chief Complaint Information obtained from Patient Bilateral Plantar Foot Ulcers History of Present Illness (HPI) Wounds are12/03/2020 upon evaluation  today patient presents for initial inspection here in our clinic concerning issues he has been having with the bottoms of his feet bilaterally. He states these actually occurred as wounds when he was hospitalized for 5 months secondary to Covid. He was apparently with tilting bed where he was in an upright position quite frequently and apparently this occurred in some way shape or form during that time. Fortunately there is no sign of active infection at this time. No fevers, chills, nausea, vomiting, or diarrhea. With that being said he still has substantial wounds on the plantar aspects of his feet Theragen require quite a bit of work to get these to heal. He has been using Santyl currently though that is been problematic both in receiving the medication as well as actually paid for it as it is become quite expensive. Prior to the experience with Covid the patient really did not have any major medical problems other than hypertension he does have some mild generalized weakness following the Covid experience. 07/22/2020 on evaluation today patient appears to be doing okay in regard to his foot ulcers I feel like the wound beds are showing signs of better improvement that I do believe the Iodoflex is helping in this regard. With that being said he does have a lot of drainage currently and this is somewhat blue/green in nature which is consistent with Pseudomonas. I do think a culture today would be appropriate for [place] to evaluate and see if that is indeed the case I would likely start him on antibiotic orally as well he is not allergic to Cipro knows of no issues he has had in the past 12/21; patient was admitted to the clinic earlier this month with bilateral presumed pressure ulcers on the bottom of his feet apparently related to excessive pressure from a tilt table arrangement in the intensive care unit. Patient relates this to being on ECMO but I am not really sure that is exactly related to that.  I must say I have never seen anything like this. He has fairly extensive full-thickness wounds extending from his heel towards his midfoot mostly centered laterally. There is already been some healing distally. He does not appear to have an arterial issue. He has been using gentamicin to the wound surfaces with Iodoflex to help with ongoing debridement 1/6; this is a patient with pressure ulcers on the bottom of his feet related to excessive pressure from a standing position in the intensive care unit. He is complaining of a lot of pain in the right heel. He is not a diabetic. 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 We have been using Iodoflex to help prepare the surfaces of both wounds for an advanced treatment product. He is nonambulatory spending most of his time in a wheelchair I have asked him not to propel the wheelchair with his heels 1/13; in general his wounds look better not much surface area change we have been using Iodoflex as of last week. I  did an x-ray of the right heel as the patient was complaining of pain. I had some thoughts about a stress fracture perhaps Achilles tendon problems however what it showed was erosive changes along the inferior aspect of the calcaneus he now has a MRI booked for 1/20. 1/20; in general his wounds continue to be better. Some improvement in the large narrow areas proximally in his foot. He is still complaining of pain in the right heel and tenderness in certain areas of this wound. His MRI is tonight. I am not just looking for osteomyelitis that was brought up on the x-ray I am wondering about stress fractures, tendon ruptures etc. He has no such findings on the left. Also noteworthy is that the patient had critical illness neuropathy and some of the discomfort may be actual improvement in nerve function I am just not sure. These wounds were initially in the setting of severe critical illness related to COVID-19. He was put in a  standing position. He may have also been on pressors at the point contributing to tissue ischemia. By his description at some point these wounds were grossly necrotic extending proximally up into the Achilles part of his heel. I do not know that I have ever really seen pictures of them like this although they may exist in epic We have ordered Tri layer Oasis. I am trying to stimulate some granulation in these areas. This is of course assuming the MRI is negative for infection 1/27; since the patient was last here he saw Dr. Earlene Plater of infectious disease. He is planned for vancomycin and ceftriaxone. Prior operative culture grew MSSA. Also ordered baseline lab work. He also ordered arterial studies although the ABIs in our clinic were normal as well as his clinical exam these were normal I do not think he needs to see vascular surgery. His ABIs at the [facility] were 1.22 in the right triphasic waveforms with a normal TBI of 1.15 on the left ABI of 1.22 with triphasic waveforms and a normal TBI of 1.08. Finally he saw Dr. Logan Bores who will follow him in for 2 months. At this point I do not think he felt that he needed a procedure on the right calcaneal bone. Dr. Earlene Plater is elected for broad-spectrum antibiotic The patient is still having pain in the right heel. He walks with a walker 2/3; wounds are generally smaller. He is tolerating his IV antibiotics. I believe this is vancomycin and ceftriaxone. We are still waiting for Oasis burn in terms of his out-of-pocket max which he should be meeting soon given the IV antibiotics, MRIs etc. I have asked him to check in on this. We are using silver collagen in the meantime the wounds look better 2/10; tolerating IV vancomycin and Rocephin. We are waiting to apply for Oasis. Although I am not really sure where he is in his out-of-pocket max. 2/17 started the first application of Oasis trilayer. Still on antibiotics. The wounds have generally look better. The area on  the left has a little more surface slough requiring debridement 2/24; second application of Oasis trilayer. The wound surface granulation is generally look better. The area on the left with undermining laterally I think is come in a bit. 10/08/2020 upon evaluation today patient is here today for [employer] application #3. Fortunately he seems to be doing extremely well with regard to this and we are seeing a lot of new epithelial growth which is great news. Fortunately there is no signs of active infection  at this time. 10/16/2020 upon evaluation today patient appears to be doing well with regard to his foot ulcers. Do believe the Oasis has been of benefit for him. I do not see CUTBERTO, RICCA ([id number]) [id number] Page 10 of 17 any signs of infection right now which is great news and I think that he has a lot of new epithelial growth which is great to see as well. The patient is very pleased to hear all of this. I do think we can proceed with the Oasis trilayer #4 today. 3/18; not as much improvement in these areas on his heels that I was hoping. I did reapply trilateral Oasis today the tissue looks healthier but not as much fill in as I was hoping. 3/25; better looking today I think this is come in a bit the tissue looks healthier. Triple layer Oasis reapplied #6 4/1; somewhat better looking definitely better looking surface not as much change in surface area as I was hoping. He may be spending more time [place] on days then he needs to although he does have heel offloading boots. Triple layer Oasis reapplied #7 4/7; unfortunately apparently [facility] will not approve any further Oasis which is unfortunate since the patient did respond nicely both in terms of the condition of the wound bed as well as surface area. There is still some drainage coming from the wound but not a lot there does not appear to be any infection 4/15; we have been using Hydrofera  Blue. He continues to have nice rims of epithelialization on the right greater than the left. The left the epithelialization is coming from the tip of his heel. There is moderate drainage. In this that concerns me about a total contact cast. There is no evidence of infection 4/29; patient has been using Hydrofera Blue with dressing changes. He has no complaints or issues today. 5/5; using Hydrofera Blue. I actually think that he looks marginally better than the last time I saw this 3 weeks ago. There are rims of epithelialization on the left thumb coming from the medial side on the right. Using Hydrofera Blue 5/12; using Hydrofera Blue. These continue to make improvements in surface area. His drainage was not listed as severe I therefore went ahead and put a cast on the left foot. Right foot we will continue to dress his previous 5/16; back for first total contact cast change. He did not tolerate this particularly well cast injury on the anterior tibia among other issues. Difficulty sleeping. I talked him about this in some detail and afterwards is elected to continue. I told him I would like to have a cast on for 3 weeks to see if this is going to help at all. I think he agreed 5/19; I think the wound is better. There is no tunneling towards his midfoot. The undermining medially also looks better. He has a rim of new skin distally. I think we are making progress here. The area on the left also continues to look somewhat better to me using Hydrofera Blue. He has a list of complaints about the cast but none of them seem serious 5/26; patient presents for 1 week follow-up. He has been using a total contact cast and tolerating this well. Hydrofera Blue is the main dressing used. He denies signs of infection. 6/2 Hydrofera Blue total contact cast on the left. These were large ulcers that formed in intensive care unit where the patient was recovering from COVID. May have had something  to do with being  ventilated in an upright positiono Pressors etc. We have been able to get the areas down considerably and a viable surface. There is some epithelialization in both sides. Note made of drainage 6/9; changed to [facility] last time because of drainage. He arrives with better looking surfaces and dimensions on the left than the right. Paradoxically the right actually probes more towards his midfoot the left is largely close down but both of these look improved. Using a total contact cast on the left 6/16; complex wounds on his bilateral plantar heels which were initially pressure injury from a stay in the ICU with COVID. We have been using silver alginate most recently. His dimensions of come in quite dramatically however not recently. We have been putting the left foot in a total contact cast 6/23; complex wounds on the bilateral plantar heels. I been putting the left in the cast paradoxically the area on the right is the one that is going towards closure at a faster rate. Quite a bit of drainage on the left. The patient went to see Dr. Logan Bores who said he was going to standby for skin grafts. I had actually considered sending him for skin grafts however he would be mandatorily off his feet for a period of weeks to months. I am thinking that the area on the right is going to close on its own the area on the left has been more stubborn even though we have him in a total contact cast 6/30; took him out of a total contact cast last week is the right heel seem to be making better progress than the left where I was placing the cast. We are using silver alginate. Both wounds are smaller right greater than left 7/12; both wounds look as though they are making some progress. We are using silver alginate. Heel offloading boots 7/26; very gradual progress especially on the right. Using silver alginate. He is wearing heel offloading boots 8/18; he continues to close these wounds down very gradually. Using silver  alginate. The problem polymen being definitive about this is areas of what appears to be callus around the margins. This is not a 100% of the area but certainly sizable especially on the right 9/1; bilateral plantar feet wounds secondary to prolonged pressure while being ventilated for COVID-19 in an upright position. Essentially pressure ulcers on the bottom of his feet. He is made substantial progress using silver alginate. 9/14; bilateral plantar feet wounds secondary to prolonged pressure. Making progress using silver alginate. 9/29 bilateral plantar feet wounds secondary to prolonged pressure. I changed him to Iodoflex last week. MolecuLight showing reddened blush fluorescence 10/11; patient presents for follow-up. He has no issues or complaints today. He denies signs of infection. He continues to use Iodoflex and antibiotic ointment to the wound beds. 10/27; 2-week follow-up. No evidence of infection. He has callus and thick dry skin around the wound margins we have been using Iodoflex and Bactroban which was in response to a moderate left MolecuLight reddish blush fluorescence. 11/10; 2-week follow-up. Wound margins again have thick callus however the measurements of the actual wound sites are a lot smaller. Everything looks reasonably healthy here. We have been using Iodoflex He was approved for prime matrix but I have elected to delay this given the improvement in the surface area. Hopefully I will not regret that decision as were getting close to the end of the year in terms of insurance payment 12/8; 2-week follow-up. Wounds are generally  smaller in size. These were initially substantial wounds extending into the forefoot all the way into the heel on the bilateral plantar feet. They are now both located on the plantar heel distal aspect both of these have a lot of callus around the wounds I used a #5 curette to remove this on the right and the left also some subcutaneous debris to try and  get the wound edges were using Iodoflex. He has heel offloading shoe 12/22; 2-week follow-up. Not really much improvement. He has thick callus around the outer edges of both wounds. I remove this there is some nonviable subcutaneous tissue as well. We have been using Iodoflex. Her intake nurse and myself spontaneously thought of a total contact cast I went back in May. At that time we really were not seeing much of an improvement with a cast although the wound was in a much different situation I would like to retry this in 2 weeks and I discussed this with the patient 08/12/2021; the patient has had some improvement with the Iodoflex. The the area on the left heel plantar more improved than the right. I had to put him in a total contact cast on the left although I decided to put that off for 2 weeks. I am going to change his primary dressing to silver collagen. I think in both areas he has had some improvement most of the healing seems to be more proximal in the heel. The wounds are in the mid aspect. A lot of thick callus on the right heel however. 1/19; we are using silver collagen on both plantar heel areas. He has had some improvement today. The left did not require any debridement. He still had some eschar on the right that was debrided but both seem to have contracted. I did not put it total contact cast on him today 2/2 we have been using silver collagen. The area on the right plantar heel has areas that appear to be epithelialized interspersed with dry flaking callus and dry skin. I removed this. This really looks better than on the other side. On the left still a large area with raised edges and debris on the surface. 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 He has no issues or complaints today. 2/9; not much change in the left foot wound with 1 week of a cast we  are using silver collagen. Silver collagen on the right side. The right side has been the better wound surface. We will reapply the total contact cast on the left 2/16; not much improvement on either side I been using silver collagen with a total contact cast on the left. I'm changing the Hydrofera Blue still with a total contact cast on the left 2/23; some improvement on both sides. Disappointing that he has thick callus around the area that we are putting in a total contact cast on the left. We've been using Hydrofera Blue on both wound areas. This is a man who at essentially pressure ulcers in addition to ischemia caused by medications to support his blood pressure (pressors) in the ICU. He was being ventilated in the standing position for severe Covid. A Shiley the wounds extended across his entire foot but are now localized to his plantar heels bilaterally. We have made progress however neither areas healed. I continue to think the total contact cast is helped  albeit painstakingly slowly. He has never wanted a plastic surgery consult although I don't know that they would be interested in grafting in area in this location. DELMONTE, YERK ([id number]) [id number] Page 11 of 17 10/07/2021: Continued improvement bilaterally. There is still some callus around the left wound, despite the total contact cast. He has some increased pain in his right midfoot around 1 particular area. This has been painful in the past but seems to be a little bit worse. When his cast was removed today, there was an area on the heel of the left foot that looks a bit macerated. He is also complaining of pain in his left thigh and hip which he thinks is secondary to the limb length discrepancy caused by the cast. 10/14/2021: He continues to improve. A little bit less callus around the left wound. He continues to endorse pain in his right midfoot, but this is not as significant as it was last week. The  maceration on his left heel is improved. 10/21/2021: Continued improvement to both wounds. The maceration on his left heel is no longer evident. Less callus bilaterally. Epithelialization progressing. 10/28/2021: Significant improvement this week. The right sided wound is nearly closed with just a small open area at the middle. No maceration seen on the left heel. Continued epithelialization on both sides. No concern for infection. 11/04/2021: T oday, the wounds were measured a little bit differently and come out as larger, but I actually think they are about the same to potentially even smaller, particularly on the left. He continues to accumulate some callus on the right. 11/11/2021: T oday, the patient is expressing some concern that the left wound, despite being in the total contact cast, is not progressing at the same rate as the 1 on the right. He is interested in trying a week without the cast to see how the wound does. The wounds are roughly the same size as last week, with the right perhaps being a little bit smaller. He continues to build up callus on both sites. 11/18/2021: Last week, I permitted the patient to go without his total contact cast, just to see if the cast was really making any difference. Today, both wounds have deteriorated to some extent, suggesting that the cast is providing benefit, at least on the left. Both are larger and have accumulated callus, slough, and other debris. 11/26/2021: I debrided both wounds quite aggressively last week in an effort to stimulate the healing cascade. This appears to have been effective as the left sided wound is a full centimeter shorter in length. Although the right was measured slightly larger, on inspection, it looks as though an area of epithelialized tissue was included in the measurements. We have been using PolyMem Ag on the wound surfaces with a total contact cast to the left. 12/02/2021: It appears that the intake personnel are including  epithelialized tissue in his wound measurements; the right wound is almost completely epithelialized; there is just a crater at the proximal midfoot with some open areas. On the left, he has built up some callus, but the overall wound surface looks good. There is some senescent skin around the wound margin. He has been in PolyMem Ag bilaterally with a total contact cast on the left. 12/09/2021: The right wound is nearly closed; there is just a small open area at the mid calcaneus. On the left, the wound is smaller with minimal callus buildup. No significant drainage. 12/16/2021: The right calcaneal wound remains minimally open at the  mid calcaneus; the rest has epithelialized. On the left, the wound is also a little bit smaller. There is some senescent tissue on the periphery. He is getting his first application of a trial skin substitute called Vendaje today. 12/23/2021: The wound on his right calcaneus is nearly closed; there is just a small area at the most distal aspect of the calcaneus that is open. On the left, the area where we applied to the skin substitute has a healthier-looking bed of granulation tissue. The wound dimensions are not significantly different on this side but the wound surface is improved. 12/30/2021: The wound on the right calcaneus has not changed significantly aside from some accumulation of callus. On the left, the open area is smaller and continues to have an improved surface. He continues to accumulate callus around the wound. He is here for his third application of Vendaje. 01/06/2022: The right calcaneal wound is down to just a couple of millimeters. He continues to accumulate periwound callus. He unfortunately got his cast wet earlier in the week and his left foot is macerated, resulting in some superficial skin loss just distal to the open wound. The open wound itself, however, is much smaller and has a healthier appearing surface. He is here for his fourth application of  Vendaje. 01/13/2022: The right calcaneal wound is about the same. Unfortunately, once again, his cast got wet and his foot is again macerated. This is caused the left calcaneal wound to enlarge. He is here for his fifth application of Vendaje. 01/20/2022: The right calcaneal wound is very small. There is some periwound callus accumulation. He purchased a new cast protector last week and this has been effective in avoiding the maceration that has been occurring on the left. The left calcaneal wound is narrower and has a healthy and viable-appearing surface. He is here for his 6 application of Vendaje. 01/27/2022: The right calcaneal wound is down to just a pinhole. There is some periwound slough and callus. On the left, the wound is narrower and shorter by about a centimeter. The surface is robust and viable-appearing. Unfortunately, the rep for the trial skin substitute product did not provide any for [place] to use today. 02/04/2022: The right calcaneal wound remains unchanged. There is more accumulated callus. On the left, although the intake nurse measured it a little bit longer, it looks about the same to me. The surface has a layer of slough, but underneath this, there is good granulation tissue. 02/10/2022: The right calcaneus wound is nearly closed. There is still some callus that builds up around the site. The left side looks about the same in terms of dimensions, but the surface is more robust and vital-appearing. 02/16/2022: The area of the right calcaneus that was nearly closed last week has closed, but there is a small opening at the mid foot where it looks like some moisture got retained and caused some reopening. The left foot wound is narrower and shallower. Both sites have a fair amount of periwound callus and eschar. 02/24/2022: The small midfoot opening on the right calcaneus is a little bit smaller today. The left foot wound is narrower and shallower. He continues to accumulate periwound callus.  No concern for infection. 03/01/2022: The patient came to clinic early because he showered and got his cast wet. Fortunately, there is no significant maceration to his foot but the callus softened and it looks like the wound on his left calcaneus may be a little bit wider. The wound on his right  calcaneus is just a narrow slit. Continued accumulation of periwound callus bilaterally. 03/08/2022: The wound on his right calcaneus is very nearly closed, just a small pinpoint opening under a bit of eschar; the left wound has come in quite a bit, as well. It is narrower and shorter than at our last visit. Still with accumulated callus and eschar bilaterally. 03/17/2022: The right calcaneal wound is healed. The left wound is smaller and the surface itself is very clean, but there is some blue-green staining on the periwound callus, concerning for Pseudomonas aeruginosa. 03/23/2022: The right calcaneal wound remains closed. The left wound continues to contract. No further blue-green staining. Small amount of callus and slough accumulation. 03/28/2022: He came in early today because he had gotten his cast wet. On inspection, the wound itself did not get wet or macerated, just a little bit of the forefoot. The wound itself is basically unchanged. 04/07/2022: The right foot wound remains closed. The left wound is the smallest that I have seen it to date. It is narrower and shorter. It still continues to accumulate slough on the surface. 04/15/2022: There is a band of epithelium now dividing the small left plantar foot wound in 2. There is still some slough on the surface. YOVANI, MACFARLAND ([id number]) [id number] Page 12 of 17 04/21/2022: The wound continues to narrow. Just a little bit of slough on the surface. He seems to be responding well to endoform. 04/28/2022: Continued slow contraction of the wound. There is a little slough on the surface and some periwound callus. We have been using  endoform and total contact cast. 05/05/2022: The wound appears to have stalled. There is slough and some periwound eschar/callus. No concern for infection, however. 05/12/2022: Unfortunately, his right foot has reopened. It is located at the most posterior aspect of his surgical incision. The area was noted to have drainage coming from it when his padding was removed today. Underneath some callus and senescent skin, there is an opening. No purulent drainage or malodor. On the left foot, the wound is again unchanged. There is some light blue staining on the callus, but no malodor or purulent drainage. 10/13; right and left heel remanence of extensive plantar foot wounds. These are better than I remember by quite a big margin however he is still left with wounds on the left plantar heel and the right plantar heel. Been using endoform bilaterally. A culture was done that showed apparently Pseudomonas but we are still waiting for the [facility] antibiotic to use gentamicin today. He is still very active by description I am not sure about the offloading of his noncasted right foot 10/20; both wounds right and left heel debrided not much change from last week. [name] Echevaria has arrived which is linezolid, gentamicin and ciprofloxacin we will use this with endoform. T contact cast on the left otal 06/02/2022: Both wounds are smaller today. There is still a fair amount of callus buildup around the right foot ulcer. The left is more superficial and nearly flush with the surrounding tissues. Also with slough and eschar buildup. 06/10/2022: The right sided wound appears to be nearly closed, if not completely so, although it is somewhat difficult to tell given the abnormal tissue and scarring in his foot. There is a fair amount of callus and crust accumulation. On the left, the wound looks about the same, again with callus and slough. He has an appointment next Thursday with Dr. Annamary Rummage in podiatry; I am hopeful that  there may be some reconstructive  options available for Mr. Osterhoudt. 06/16/2022: Both wounds have some eschar and callus accumulation. The right sided wound is extremely narrow and barely open; the left is narrower than last week. There is a little bit of slough. He has his appointment in podiatry later today. 06/23/2022: The patient met with Dr. Annamary Rummage last week and unfortunately, there are no reconstructive options that he believes would be helpful. He did order an MRI to evaluate for osteomyelitis and fortunately, none was seen. The left sided wound is a little bit shorter and narrower today. The right sided wound is about the same. There is callus and eschar accumulation bilaterally. 06/29/2022: Both feet have improved from last week. There is epithelium making a valiant effort to creep across the surface on the left. The right side looks like it got a little dry and the deep crevasse in his midfoot has cracked. Both have eschar and there is some slough on the left. 07/07/2022: Both feet have improved. There is epithelium completely covering the calcaneus on the right with just a small opening in the crevasse in his midfoot. On the left, the open area of tissue is smaller but he continues to build up callus/eschar and slough. 07/15/2022: The opening in the midfoot on the right is about the same size, covered with eschar and a little bit of slough. The open portion of the left wound is narrower and shorter with a bit of slough buildup. He admits to being on his feet more than recommended. 12/14; as far as I can tell everything on the right foot is closed. There is some eschar I removed some of this I cannot identify any open wound here. As usual this will be a very vulnerable area going forward. On the left this looks really quite healthy. I was pleasantly surprised to see how good this looked. Wound is certainly smaller and there appears to be healthy epithelialization. He has been using  Promogran on the right and endoform on the left. He has been offloading the right foot with a heel offloading boot and he has a running shoe on the right foot 08/04/2022: The right foot remains closed. He has a thick cushioned insole in his sneaker. The left sided wound is smaller with just some slough and eschar accumulation. He is wearing the heel off loader on this foot. 08/15/2022: The right foot remains closed. The left wound has narrowed further. There is some slough and eschar accumulation. 08/25/2022: We put him in a peg assist shoe insert and as a result, he has more epithelialization of the ulcer with minimal slough and eschar accumulation. 09/01/2022: The wound is smaller by about half this week. Still with some slough on the surface. The peg assist shoe seems to be doing a remarkable job of adequately offloading the site. 09/08/2022: There is a little bit more epithelium coming in. There is some slough and callus buildup. 09/16/2022: The wound measures about the same size, but the epithelium that has grown in looks more robust and stronger. There is some slough and callus buildup. 09/23/2022: The wound remains about the same size. The skin edges are looking rather senescent. 09/29/2022: The aggressive debridement I performed last week seems to have been effective. The wound is smaller and has significantly less slough accumulation. 10/06/2022: There has been quite a bit of epithelialization since last week. There are still some open areas with slough accumulation. There is some callus buildup around the perimeter. 10/13/2022: Continued improvement. Minimal callus accumulation. 3/14; patient presents  for follow-up. He has been using endoform to the wound bed without issues. He is using a surgical shoe with peg assist for offloading. 10/27/2022: The wound dimensions are stable. There is some senescent skin accumulation around the perimeter. 11/04/2022: Last week I performed a very aggressive  debridement in an effort to stimulate the healing cascade. As has been the case when I have done this before, the wound has responded well. There has been epithelialization and contraction of the wound. There are just a couple of small open areas. 11/10/2022: Unfortunately, his foot got wet secondary to sweating and there has been some breakdown of the tissue, particularly the skin just adjacent to the main ulcer. 11/18/2022: We continue to struggle with moisture-related tissue breakdown around the perimeter of his wound. This has caused the thin epithelium that had formed on the surface to disintegrate. The underlying surface of the wound has good granulation tissue. 11/25/2022: The edges of the wound are much less macerated, but the surface is actually looking a little bit dry. There is slough accumulation. No obvious signs of infection. 12/01/2022: The wound edges are more macerated and broken down, making the wound larger. The surface has some slough on it. JOANATHAN, [name] ([id number]) [id number] Page 13 of 17 12/08/2020: The wound looks better this week. There is significantly less maceration and moisture-related tissue breakdown. There is some slough on the wound surface. 12/15/2022: The wound continues to improve. There is almost no moisture-related tissue breakdown. There is epithelium beginning to fill in again from the edges. Light slough on the wound surface. 12/22/2022: More epithelium has filled him from around the edges. There is some slough on the surface. No evidence of moisture-related tissue breakdown. 01/05/2023: There has been more epithelialization, particularly at the posterior calcaneal aspect of the wound. There has been a little bit of moisture accumulation with minimal maceration. 01/12/2023: More epithelium has filled in. There is very minimal accumulation of slough. 01/20/2023: The wound is measuring a little bit smaller today. It did measure deeper, but  this appears to be secondary to some heaped up senescent skin around the edges. The epithelium that has been present at the posterior aspect of the wound seems to be a bit more robust with greater integrity. 01/26/2023: He reports intense itching to the skin around the wound and there has been some breakdown here. It looks fungal in nature. The wound itself looks pretty good with improved epithelialization with just some slough buildup. He has a little callus along the wound edges. 02/02/2023: He has been treating the periwound skin with an over-the-counter athlete's foot medication and notes significant improvement in his pruritus. The skin looks better here, as well. The wound continues to epithelialize. There is light slough accumulation. 02/10/2023: The periwound skin still looks fairly angry. He reports multiple small blisters that have opened up. He is not having the intense itching that he had prior, however. There has been more epithelialization at the calcaneus and there is minimal slough accumulation. 02/16/2023: The periwound skin is improving. An online review of dermatology rash images suggest that this may be hand-foot-and-mouth disease, isolated to the feet; the patient says he actually had this as a child. The calcaneus continues to epithelialize and the tissue was quite robust here. Very minimal slough accumulation on the open portion of the wound with a little bit of periwound callus buildup. 03/03/2023: The wound measurements are about the same, but there is more epithelium making its way to the surface. There is a  little bit of crust and eschar around the edges and minimal slough on the surface. 03/09/2023: His dressing was applied rather haphazardly and the drawtex was not applied. The Optifoam was also reversed such that the orange side was in contact with his bare skin. He has some maceration around the wound edges, but this did not appear to result in any tissue breakdown. There is a  little bit of slough on the surface and minimal eschar around the edges. Patient History Information obtained from Patient. Family History Cancer - Maternal Grandparents, Diabetes - Father,Paternal Grandparents, Heart Disease - Maternal Grandparents, Hypertension - Father,Paternal Grandparents, Lung Disease - Siblings, No family history of Hereditary Spherocytosis, Kidney Disease, Seizures, Stroke, Thyroid Problems, Tuberculosis. Social History Never smoker, Marital Status - Married, Alcohol Use - Never, Drug Use - No History, Caffeine Use - Daily - tea, soda. Medical History Eyes Denies history of Cataracts, Glaucoma, Optic Neuritis Ear/Nose/Mouth/Throat Denies history of Chronic sinus problems/congestion, Middle ear problems Hematologic/Lymphatic Denies history of Anemia, Hemophilia, Human Immunodeficiency Virus, Lymphedema, Sickle Cell Disease Respiratory Patient has history of Asthma Denies history of Aspiration, Chronic Obstructive Pulmonary Disease (COPD), Pneumothorax, Sleep Apnea, Tuberculosis Cardiovascular Patient has history of Angina - with COVID, Hypertension Denies history of Arrhythmia, Congestive Heart Failure, Coronary Artery Disease, Deep Vein Thrombosis, Hypotension, Myocardial Infarction, Peripheral Arterial Disease, Peripheral Venous Disease, Phlebitis, Vasculitis Gastrointestinal Denies history of Cirrhosis , Colitis, Crohns, Hepatitis A, Hepatitis B, Hepatitis C Endocrine Denies history of Type I Diabetes, Type II Diabetes Genitourinary Denies history of End Stage Renal Disease Immunological Denies history of Lupus Erythematosus, Raynauds, Scleroderma Integumentary (Skin) Denies history of History of Burn Musculoskeletal Denies history of Gout, Rheumatoid Arthritis, Osteoarthritis, Osteomyelitis Neurologic Denies history of Dementia, Neuropathy, Quadriplegia, Paraplegia, Seizure Disorder Oncologic Denies history of Received Chemotherapy, Received  Radiation Psychiatric Denies history of Anorexia/bulimia, Confinement Anxiety Hospitalization/Surgery History - COVID PNA 07/22/2019- 11/14/2019. - 03/27/2020 wound debridement/ skin graft. Medical A Surgical History Notes nd Constitutional Symptoms (General Health) COVID PNA 07/22/2019-11/14/2019 [facility] ECMO, foot drop left foot , Genitourinary kidney stone Psychiatric anxiety WHIT, TONES ([id number]) [id number] Page 14 of 17 Objective Constitutional Slightly hypertensive. no acute distress. Vitals Time Taken: 2:36 PM, Height: 69 in, Weight: 280 lbs, BMI: 41.3, Temperature: 98.8 F, Pulse: 93 bpm, Respiratory Rate: 18 breaths/min, Blood Pressure: 143/94 mmHg. Respiratory Normal work of breathing on room air. General Notes: 03/09/2023: He has some maceration around the wound edges, but this did not appear to result in any tissue breakdown. There is a little bit of slough on the surface and minimal eschar around the edges. Integumentary (Hair, Skin) Wound #2 status is Open. Original cause of wound was Pressure Injury. The date acquired was: 10/07/2019. The wound has been in treatment 138 weeks. The wound is located on the Left Calcaneus. The wound measures 4.5cm length x 2cm width x 0.2cm depth; 7.069cm^2 area and 1.414cm^3 volume. There is Fat Layer (Subcutaneous Tissue) exposed. There is no tunneling or undermining noted. There is a medium amount of serosanguineous drainage noted. The wound margin is distinct with the outline attached to the wound base. There is large (67-100%) pink granulation within the wound bed. There is a small (1-33%) amount of necrotic tissue within the wound bed including Adherent Slough. The periwound skin appearance had no abnormalities noted for moisture. The periwound skin appearance exhibited: Scarring. The periwound skin appearance did not exhibit: Callus, Crepitus, Excoriation, Induration, Rash, Atrophie Blanche,  Cyanosis, Ecchymosis, Hemosiderin Staining, Mottled, Pallor, Rubor, Erythema. Periwound temperature was noted  as No Abnormality. The periwound has tenderness on palpation. Assessment Active Problems ICD-10 Non-pressure chronic ulcer of other part of left foot with fat layer exposed Procedures Wound #2 Pre-procedure diagnosis of Wound #2 is a Pressure Ulcer located on the Left Calcaneus . There was a Selective/Open Wound Non-Viable Tissue Debridement with a total area of 7.06 sq cm performed by [name], MD. With the following instrument(s): Curette to remove Non-Viable tissue/material. Material removed includes [facility] after achieving pain control using Lidocaine 5% topical ointment. No specimens were taken. A time out was conducted at 14:45, prior to the start of the procedure. A Minimum amount of bleeding was controlled with Pressure. The procedure was tolerated well. Post Debridement Measurements: 4.5cm length x 2cm width x 0.2cm depth; 1.414cm^3 volume. Post debridement Stage noted as Category/Stage III. Character of Wound/Ulcer Post Debridement is improved. Post procedure Diagnosis Wound #2: Same as Pre-Procedure General Notes: Scribed for Dr Lady Gary by Redmond Pulling, RN. Plan Follow-up Appointments: Return Appointment in 1 week. - Dr. Lady Gary Room 3 03/15/23 @ 0930 Anesthetic: Wound #2 Left Calcaneus: (In clinic) Topical Lidocaine 4% applied to wound bed - In clinic Bathing/ Shower/ Hygiene: May shower and wash wound with soap and water. Edema Control - Lymphedema / SCD / Other: Avoid standing for long periods of time. Moisturize legs daily. - as needed Off-Loading: Other: - keep pressure off of the bottom of your feet. Elevate legs throughout the day. Use the Shoe with the PegAssist off-loading insole Additional Orders / Instructions: Follow Nutritious Diet - Try to get 70-100 grams of Protein a day+ The following medication(s) was prescribed: lidocaine topical 5 % ointment  ointment topical once daily was prescribed at facility WOUND #2: - Calcaneus Wound Laterality: Left Cleanser: Normal Saline (Generic) 1 x Per Day/30 Days Discharge Instructions: Cleanse the wound with Normal Saline prior to applying a clean dressing using gauze sponges, not tissue or cotton balls. LYNCOLN, BRAUN ([id number]) [id number] Page 15 of 17 Cleanser: Wound Cleanser (Generic) 1 x Per Day/30 Days Discharge Instructions: Cleanse the wound with wound cleanser prior to applying a clean dressing using gauze sponges, not tissue or cotton balls. Peri-Wound Care: Ketoconazole Cream 2% 1 x Per Day/30 Days Discharge Instructions: Apply Ketoconazole mixed with zinc to the periwound Peri-Wound Care: Zinc Oxide Ointment 30g tube 1 x Per Day/30 Days Discharge Instructions: Apply Zinc Oxide to periwound with each dressing change Topical: Gentamicin (Generic) 1 x Per Day/30 Days Discharge Instructions: apply to the wound bed Prim Dressing: Maxorb Extra Ag+ Alginate Dressing, 4x4.75 (in/in) (Generic) 1 x Per Day/30 Days ary Discharge Instructions: Apply to wound bed as instructed Secondary Dressing: Drawtex 4x4 in (Generic) 1 x Per Day/30 Days Discharge Instructions: Apply over primary dressing as directed. Secondary Dressing: Optifoam Non-Adhesive Dressing, 4x4 in (Generic) 1 x Per Day/30 Days Discharge Instructions: Apply over primary dressing as directed. Secondary Dressing: Woven Gauze Sponge, Non-Sterile 4x4 in (Generic) 1 x Per Day/30 Days Discharge Instructions: Apply over primary dressing as directed. Secured With: 72M Medipore H Soft Cloth Surgical T ape, 4 x 10 (in/yd) (Generic) 1 x Per Day/30 Days Discharge Instructions: Secure with tape as directed. Com pression Wrap: Kerlix Roll 4.5x3.1 (in/yd) (Generic) 1 x Per Day/30 Days Discharge Instructions: Apply Kerlix and Coban compression as directed. Add-Ons: [employer] Offloading Boot, Size Regular 1 x Per  Day/30 Days 03/09/2023: His dressing was applied rather haphazardly and the drawtex was not applied. The Optifoam was also reversed such that the orange side was in  contact with his bare skin. He has some maceration around the wound edges, but this did not appear to result in any tissue breakdown. There is a little bit of slough on the surface and minimal eschar around the edges. I used a curette to debride slough and eschar from his wound. We will continue topical gentamicin with silver alginate and drawtex with an Optifoam to help with offloading. He continues to wear a peg assist sandal. I emphasized to him the importance of drawtex for his wound as he otherwise seems to accumulate too much moisture. Follow-up in 1 week. Electronic Signature(s) Signed: 03/09/2023 3:11:33 PM By: [name] MD FACS Entered By: [name] on 03/09/2023 15:11:32 -------------------------------------------------------------------------------- HxROS Details Patient Name: Date of Service: [name], [name] 03/09/2023 2:30 PM Medical Record Number: [id number] Patient Account Number: [id number] Date of Birth/Sex: Treating RN: 1975/10/28 (47 y.o. M) Primary Care Provider: Dorinda Hill Other Clinician: Referring Provider: Treating Provider/Extender: Jana Hakim in Treatment: 138 Information Obtained From Patient Constitutional Symptoms (General Health) Medical History: Past Medical History Notes: COVID PNA 07/22/2019-11/14/2019 VENT ECMO, foot drop left foot , Eyes Medical History: Negative for: Cataracts; Glaucoma; Optic Neuritis Ear/Nose/Mouth/Throat Medical History: Negative for: Chronic sinus problems/congestion; Middle ear problems Hematologic/Lymphatic Medical History: Negative for: Anemia; Hemophilia; Human Immunodeficiency Virus; Lymphedema; Sickle Cell Disease Respiratory JERMONE, CABINESS ([id number]) [id number] Page 16 of 17 Medical  History: Positive for: Asthma Negative for: Aspiration; Chronic Obstructive Pulmonary Disease (COPD); Pneumothorax; Sleep Apnea; Tuberculosis Cardiovascular Medical History: Positive for: Angina - with COVID; Hypertension Negative for: Arrhythmia; Congestive Heart Failure; Coronary Artery Disease; Deep Vein Thrombosis; Hypotension; Myocardial Infarction; Peripheral Arterial Disease; Peripheral Venous Disease; Phlebitis; Vasculitis Gastrointestinal Medical History: Negative for: Cirrhosis ; Colitis; Crohns; Hepatitis A; Hepatitis B; Hepatitis C Endocrine Medical History: Negative for: Type I Diabetes; Type II Diabetes Genitourinary Medical History: Negative for: End Stage Renal Disease Past Medical History Notes: kidney stone Immunological Medical History: Negative for: Lupus Erythematosus; Raynauds; Scleroderma Integumentary (Skin) Medical History: Negative for: History of Burn Musculoskeletal Medical History: Negative for: Gout; Rheumatoid Arthritis; Osteoarthritis; Osteomyelitis Neurologic Medical History: Negative for: Dementia; Neuropathy; Quadriplegia; Paraplegia; Seizure Disorder Oncologic Medical History: Negative for: Received Chemotherapy; Received Radiation Psychiatric Medical History: Negative for: Anorexia/bulimia; Confinement Anxiety Past Medical History Notes: anxiety Immunizations Pneumococcal Vaccine: Received Pneumococcal Vaccination: No Implantable Devices None Hospitalization / Surgery History Type of Hospitalization/Surgery COVID PNA 07/22/2019- 11/14/2019 03/27/2020 wound debridement/ skin graft Family and Social History Cancer: Yes - Maternal Grandparents; Diabetes: Yes - Father,Paternal Grandparents; Heart Disease: Yes - Maternal Grandparents; Hereditary Spherocytosis: No; Hypertension: Yes - Father,Paternal Grandparents; Kidney Disease: No; Lung Disease: Yes - Siblings; Seizures: No; Stroke: No; Thyroid Problems: No; Tuberculosis: No; Never  smoker; Marital Status - Married; Alcohol Use: Never; Drug Use: No History; Caffeine Use: Daily - tea, soda; Financial Concerns: No; Food, Clothing or Shelter Needs: No; Support System Lacking: No; Transportation Concerns: No GREER, GABEHART ([id number]) [id number] Page 17 of 17 Electronic Signature(s) Signed: 03/09/2023 3:16:59 PM By: [name] MD FACS Entered By: [name] on 03/09/2023 15:09:47 -------------------------------------------------------------------------------- SuperBill Details Patient Name: Date of Service: [name], TO [name] 03/09/2023 Medical Record Number: [id number] Patient Account Number: [id number] Date of Birth/Sex: Treating RN: 1975/11/25 (47 y.o. M) Primary Care Provider: Dorinda Hill Other Clinician: Referring Provider: Treating Provider/Extender: Jana Hakim in Treatment: 138 Diagnosis Coding ICD-10 Codes Code Description [phone] Non-pressure chronic ulcer of other part of left foot with fat layer exposed Facility Procedures : CPT4  Code: [id number] Description: 97597 - DEBRIDE WOUND 1ST 20 SQ CM OR < ICD-10 Diagnosis Description L97.522 Non-pressure chronic ulcer of other part of left foot with fat layer exposed Modifier: Quantity: 1 Physician Procedures : CPT4 Code Description Modifier [id number] - WC PHYS LEVEL 4 - EST PT 25 ICD-10 Diagnosis Description L97.522 Non-pressure chronic ulcer of other part of left foot with fat layer exposed Quantity: 1 : [id number] 97597 - WC PHYS DEBR WO ANESTH 20 SQ CM ICD-10 Diagnosis Description L97.522 Non-pressure chronic ulcer of other part of left foot with fat layer exposed Quantity: 1 Electronic Signature(s) Signed: 03/09/2023 3:11:47 PM By: [name] MD FACS Entered By: [name] on 03/09/2023 15:11:46

## 2023-03-15 ENCOUNTER — Encounter (HOSPITAL_BASED_OUTPATIENT_CLINIC_OR_DEPARTMENT_OTHER): Payer: PPO | Admitting: General Surgery

## 2023-03-15 DIAGNOSIS — L97522 Non-pressure chronic ulcer of other part of left foot with fat layer exposed: Secondary | ICD-10-CM | POA: Diagnosis not present

## 2023-03-15 DIAGNOSIS — L89623 Pressure ulcer of left heel, stage 3: Secondary | ICD-10-CM | POA: Diagnosis not present

## 2023-03-15 NOTE — Progress Notes (Addendum)
DENNARD, AIREY ([id number]) [id number] Page 1 of 7 Visit Report for 03/15/2023 Arrival Information Details Patient Name: Date of Service: [name], TO [name] 03/15/2023 9:30 A M Medical Record Number: [id number] Patient Account Number: [id number] Date of Birth/Sex: Treating RN: 05/13/1976 (47 y.o. M) Primary Care Yanuel Tagg: Dorinda Hill Other Clinician: Referring Jezebel Pollet: Treating [name]/Extender: Jana Hakim in Treatment: 139 Visit Information History Since Last Visit All ordered tests and consults were completed: No Patient Arrived: Wheel Chair Added or deleted any medications: No Arrival Time: 09:27 Any new allergies or adverse reactions: No Accompanied By: family Had a fall or experienced change in No Transfer Assistance: None activities of daily living that may affect Patient Identification Verified: Yes risk of falls: Secondary Verification Process Completed: Yes Signs or symptoms of abuse/neglect since last visito No Patient Requires Transmission-Based Precautions: No Hospitalized since last visit: No Patient Has Alerts: No Implantable device outside of the clinic excluding No cellular tissue based products placed in the center since last visit: Pain Present Now: No Electronic Signature(s) Signed: 03/15/2023 9:39:28 AM By: Dayton Scrape Entered By: Dayton Scrape on 03/15/2023 09:28:10 -------------------------------------------------------------------------------- Encounter Discharge Information Details Patient Name: Date of Service: [name], TO [name] 03/15/2023 9:30 A M Medical Record Number: [id number] Patient Account Number: [id number] Date of Birth/Sex: Treating RN: 11-15-1975 (47 y.o. [name] Primary Care [name]: Dorinda Hill Other Clinician: Referring Sallye Lunz: Treating [name]/Extender: Jana Hakim in Treatment: 139 Encounter Discharge Information Items Post Procedure Vitals Discharge  Condition: Stable Temperature (F): 98.2 Ambulatory Status: Wheelchair Pulse (bpm): 78 Discharge Destination: Home Respiratory Rate (breaths/min): 18 Transportation: Private Auto Blood Pressure (mmHg): 128/68 Accompanied By: family Schedule Follow-up Appointment: Yes Clinical Summary of Care: Patient Declined Electronic Signature(s) Signed: 04/04/2023 2:03:55 PM By: Brenton Grills Entered By: Brenton Grills on 03/15/2023 10:05:00 [name] ([id number]) [id number] Page 2 of 7 -------------------------------------------------------------------------------- Lower Extremity Assessment Details Patient Name: Date of Service: [name] [place] 03/15/2023 9:30 A M Medical Record Number: [id number] Patient Account Number: [id number] Date of Birth/Sex: Treating RN: 1976-06-07 (47 y.o. [name] Primary Care Nakoa Ganus: Dorinda Hill Other Clinician: Referring [name]: Treating [name]/Extender: Jana Hakim in Treatment: 139 Edema Assessment Assessed: [name]: No] [name]: No] Edema: [Left: N] [Right: o] Calf Left: Right: Point of Measurement: 29 cm From Medial Instep 43.5 cm Ankle Left: Right: Point of Measurement: 9 cm From Medial Instep 24.4 cm Vascular Assessment Pulses: Dorsalis Pedis Palpable: [Left:Yes] Extremity colors, hair growth, and conditions: Extremity Color: [Left:Normal] Hair Growth on Extremity: [Left:Yes] Temperature of Extremity: [Left:Warm] Capillary Refill: [Left:< 3 seconds] Dependent Rubor: [Left:No No] Toe Nail Assessment Left: Right: Thick: No Discolored: No Deformed: No Improper Length and Hygiene: No Electronic Signature(s) Signed: 04/04/2023 2:03:55 PM By: Brenton Grills Entered By: Brenton Grills on 03/15/2023 09:41:12 -------------------------------------------------------------------------------- Multi Wound Chart Details Patient Name: Date of Service: [name], [name] 03/15/2023 9:30 A  M Medical Record Number: [id number] Patient Account Number: [id number] Date of Birth/Sex: Treating RN: 02-26-1976 (47 y.o. M) Primary Care [name]: Dorinda Hill Other Clinician: Referring Jazmon Kos: Treating [name]/Extender: Jana Hakim in Treatment: 139 Vital Signs Height(in): 69 Pulse(bpm): 73 Weight(lbs): 280 Blood Pressure(mmHg): 124/77 Body Mass Index(BMI): 41.3 [name], [name] ([id number]) [id number] Page 3 of 7 Temperature(F): 97.8 Respiratory Rate(breaths/min): 18 [2:Photos:] [N/A:N/A] Left Calcaneus N/A N/A Wound Location: Pressure Injury N/A N/A Wounding Event: Pressure Ulcer N/A N/A Primary Etiology: Asthma, Angina, Hypertension N/A N/A Comorbid History: 10/07/2019 N/A N/A  Date Acquired: 109 N/A N/A Weeks of Treatment: Open N/A N/A Wound Status: No N/A N/A Wound Recurrence: 4.5x1.5x0.2 N/A N/A Measurements L x W x D (cm) 5.301 N/A N/A A (cm) : rea 1.06 N/A N/A Volume (cm) : 80.40% N/A N/A % Reduction in A rea: 96.10% N/A N/A % Reduction in Volume: Category/Stage III N/A N/A Classification: Medium N/A N/A Exudate A mount: Serosanguineous N/A N/A Exudate Type: red, brown N/A N/A Exudate Color: Distinct, outline attached N/A N/A Wound Margin: Large (67-100%) N/A N/A Granulation A mount: Pink N/A N/A Granulation Quality: Small (1-33%) N/A N/A Necrotic A mount: Fat Layer (Subcutaneous Tissue): Yes N/A N/A Exposed Structures: Fascia: No Tendon: No Muscle: No Joint: No Bone: No Small (1-33%) N/A N/A Epithelialization: Debridement - Selective/Open Wound N/A N/A Debridement: Pre-procedure Verification/Time Out 09:50 N/A N/A Taken: Lidocaine 4% T opical Solution N/A N/A Pain Control: Callus, Slough N/A N/A Tissue Debrided: Non-Viable Tissue N/A N/A Level: 5.3 N/A N/A Debridement A (sq cm): rea Curette N/A N/A Instrument: Minimum N/A N/A Bleeding: Pressure N/A N/A Hemostasis A  chieved: 0 N/A N/A Procedural Pain: 0 N/A N/A Post Procedural Pain: Procedure was tolerated well N/A N/A Debridement Treatment Response: 4.5x1.5x0.2 N/A N/A Post Debridement Measurements L x W x D (cm) 1.06 N/A N/A Post Debridement Volume: (cm) Category/Stage III N/A N/A Post Debridement Stage: Scarring: Yes N/A N/A Periwound Skin Texture: Excoriation: No Induration: No Callus: No Crepitus: No Rash: No Dry/Scaly: Yes N/A N/A Periwound Skin Moisture: Maceration: No Atrophie Blanche: No N/A N/A Periwound Skin Color: Cyanosis: No Ecchymosis: No Erythema: No Hemosiderin Staining: No Mottled: No Pallor: No Rubor: No No Abnormality N/A N/A Temperature: Yes N/A N/A Tenderness on Palpation: Debridement N/A N/A Procedures Performed: Treatment Notes [name], [name] ([id number]) [id number] Page 4 of 7 Electronic Signature(s) Signed: 03/15/2023 9:54:48 AM By: [name] MD FACS Entered By: [name] on 03/15/2023 09:54:48 -------------------------------------------------------------------------------- Multi-Disciplinary Care Plan Details Patient Name: Date of Service: [name], TO [name] 03/15/2023 9:30 A M Medical Record Number: [id number] Patient Account Number: [id number] Date of Birth/Sex: Treating RN: [birth year] (47 y.o. [name] Primary Care Marwah Disbro: Dorinda Hill Other Clinician: Referring Aylyn Wenzler: Treating Marlin Jarrard/Extender: Jana Hakim in Treatment: 139 Multidisciplinary Care Plan reviewed with physician Active Inactive Wound/Skin Impairment Nursing Diagnoses: Impaired tissue integrity Knowledge deficit related to ulceration/compromised skin integrity Goals: Patient/caregiver will verbalize understanding of skin care regimen Date Initiated: 07/15/2020 Target Resolution Date: 05/08/2023 Goal Status: Active Ulcer/skin breakdown will have a volume reduction of 30% by week 4 Date Initiated:  07/15/2020 Date Inactivated: 08/20/2020 Target Resolution Date: 09/03/2020 Goal Status: Unmet Unmet Reason: no major changes. Ulcer/skin breakdown will heal within 14 weeks Date Initiated: 12/04/2020 Date Inactivated: 12/10/2020 Target Resolution Date: 12/10/2020 Unmet Reason: wounds still open at 14 Goal Status: Unmet weeks and today 21 weeks. Interventions: Assess patient/caregiver ability to obtain necessary supplies Assess patient/caregiver ability to perform ulcer/skin care regimen upon admission and as needed Assess ulceration(s) every visit Provide education on ulcer and skin care Treatment Activities: Skin care regimen initiated : 07/15/2020 Topical wound management initiated : 07/15/2020 Notes: Electronic Signature(s) Signed: 04/04/2023 2:03:55 PM By: Brenton Grills Entered By: Brenton Grills on 03/15/2023 09:45:03 -------------------------------------------------------------------------------- Pain Assessment Details Patient Name: Date of Service: [name], TO [name] 03/15/2023 9:30 A M Medical Record Number: [id number] Patient Account Number: [id number] Date of Birth/Sex: Treating RN: 04/22/1976 (47 y.o. M) Primary Care Tron Flythe: Dorinda Hill Other Clinician: Referring Quinnley Colasurdo: Treating Adamary Savary/Extender: Lilyan Gilford,  Henrietta Dine ([id number]) [id number] Page 5 of 7 Weeks in Treatment: 139 Active Problems Location of Pain Severity and Description of Pain Patient Has Paino No Site Locations Pain Management and Medication Current Pain Management: Electronic Signature(s) Signed: 03/15/2023 9:39:28 AM By: Dayton Scrape Entered By: Dayton Scrape on 03/15/2023 09:29:00 -------------------------------------------------------------------------------- Patient/Caregiver Education Details Patient Name: Date of Service: [name] 8/7/2024andnbsp9:30 A M Medical Record Number: [id number] Patient Account Number: [id number] Date of  Birth/Gender: Treating RN: November 23, 1975 (47 y.o. [name] Primary Care Physician: Dorinda Hill Other Clinician: Referring Physician: Treating Physician/Extender: Jana Hakim in Treatment: 139 Education Assessment Education Provided To: Patient and Caregiver Education Topics Provided Wound/Skin Impairment: Methods: Explain/Verbal Responses: State content correctly Electronic Signature(s) Signed: 04/04/2023 2:03:55 PM By: Brenton Grills Entered By: Brenton Grills on 03/15/2023 09:45:28 [name] ([id number]) [id number] Page 6 of 7 -------------------------------------------------------------------------------- Wound Assessment Details Patient Name: Date of Service: [name] [name] 03/15/2023 9:30 A M Medical Record Number: [id number] Patient Account Number: [id number] Date of Birth/Sex: Treating RN: 04/19/1976 (47 y.o. M) Primary Care Kathaleya Mcduffee: Dorinda Hill Other Clinician: Referring Vernon Ariel: Treating Isabellarose Kope/Extender: Jana Hakim in Treatment: 139 Wound Status Wound Number: 2 Primary Etiology: Pressure Ulcer Wound Location: Left Calcaneus Wound Status: Open Wounding Event: Pressure Injury Comorbid History: Asthma, Angina, Hypertension Date Acquired: 10/07/2019 Weeks Of Treatment: 139 Clustered Wound: No Photos Wound Measurements Length: (cm) 4 Width: (cm) 1 Depth: (cm) 0 Area: (cm) Volume: (cm) .5 % Reduction in Area: 80.4% .5 % Reduction in Volume: 96.1% .2 Epithelialization: Small (1-33%) 5.301 Tunneling: No 1.06 Undermining: No Wound Description Classification: Category/Stage III Wound Margin: Distinct, outline attached Exudate Amount: Medium Exudate Type: Serosanguineous Exudate Color: red, brown Foul Odor After Cleansing: No Slough/Fibrino No Wound Bed Granulation Amount: Large (67-100%) Exposed Structure Granulation Quality: Pink Fascia Exposed: No Necrotic Amount:  Small (1-33%) Fat Layer (Subcutaneous Tissue) Exposed: Yes Necrotic Quality: Adherent Slough Tendon Exposed: No Muscle Exposed: No Joint Exposed: No Bone Exposed: No Periwound Skin Texture Texture Color No Abnormalities Noted: No No Abnormalities Noted: No Callus: No Atrophie Blanche: No Crepitus: No Cyanosis: No Excoriation: No Ecchymosis: No Induration: No Erythema: No Rash: No Hemosiderin Staining: No Scarring: Yes Mottled: No Pallor: No Moisture Rubor: No No Abnormalities Noted: Yes Temperature / Pain Temperature: No Abnormality Tenderness on PalpationMELODY, [name] ([id number]) [id number] Page 7 of 7 Electronic Signature(s) Signed: 04/04/2023 2:03:55 PM By: Brenton Grills Previous Signature: 03/15/2023 9:39:28 AM Version By: Dayton Scrape Entered By: Brenton Grills on 03/15/2023 09:44:13 -------------------------------------------------------------------------------- Vitals Details Patient Name: Date of Service: [name], [name] 03/15/2023 9:30 A M Medical Record Number: [id number] Patient Account Number: [id number] Date of Birth/Sex: Treating RN: [birth year] (47 y.o. M) Primary Care Kaisley Stiverson: Dorinda Hill Other Clinician: Referring Jarek Longton: Treating Demitrious Mccannon/Extender: Jana Hakim in Treatment: 139 Vital Signs Time Taken: 09:28 Temperature (F): 97.8 Height (in): 69 Pulse (bpm): 73 Weight (lbs): 280 Respiratory Rate (breaths/min): 18 Body Mass Index (BMI): 41.3 Blood Pressure (mmHg): 124/77 Reference Range: 80 - 120 mg / dl Electronic Signature(s) Signed: 03/15/2023 9:39:28 AM By: Dayton Scrape Entered By: Dayton Scrape on 03/15/2023 09:28:53

## 2023-03-20 ENCOUNTER — Other Ambulatory Visit (INDEPENDENT_AMBULATORY_CARE_PROVIDER_SITE_OTHER): Payer: Self-pay | Admitting: Family Medicine

## 2023-03-20 ENCOUNTER — Other Ambulatory Visit: Payer: Self-pay | Admitting: Allergy & Immunology

## 2023-03-20 DIAGNOSIS — E119 Type 2 diabetes mellitus without complications: Secondary | ICD-10-CM

## 2023-03-20 DIAGNOSIS — E559 Vitamin D deficiency, unspecified: Secondary | ICD-10-CM

## 2023-03-21 ENCOUNTER — Ambulatory Visit (INDEPENDENT_AMBULATORY_CARE_PROVIDER_SITE_OTHER): Payer: PPO | Admitting: Licensed Clinical Social Worker

## 2023-03-21 DIAGNOSIS — F4323 Adjustment disorder with mixed anxiety and depressed mood: Secondary | ICD-10-CM

## 2023-03-22 NOTE — Progress Notes (Signed)
Comprehensive Clinical Assessment (CCA) Note  03/22/2023 VOLTAIRE LAWRANCE [id number]  Chief Complaint:  Chief Complaint  Patient presents with   Obesity   Visit Diagnosis: Adjustment disorder with mixed anxiety and depressed mood     CCA Biopsychosocial Intake/Chief Complaint:  Bariatric evaluation  Current Symptoms/Problems: Had long COVID in 04-06-19 and nearly died, has ulcers on feet that he is still recovering from, he can't work right now due to this and feels down, difficulty with sleep at time, memory issues, low self esteem, weight flucuates, had lost 18 lbs but gained some back, mild feelings of hopelessness at times, No HI/SI, no psychosis   Patient Reported Schizophrenia/Schizoaffective Diagnosis in Past: No   Strengths: funny, likes communication, punctional, hardworker  Preferences: doesn't like pushy people, prefers peoples communication style to not be harsh  Abilities: can be good with his hands, communication, video games   Type of Services Patient Feels are Needed: Bariatric   Initial Clinical Notes/Concerns: History of obesity: weight started to increase in middle school and increased since,  weight loss attempts: slim fast, fasting, medication, change eating,  Current Diet: increasing water intake, increasing protein, and increasing vegetables, being mindful of eating,  Co-morbid diagnosis: high blood pressure, shortness of breath, difficulty walking, arthritis, back problems,  previous procedures: back surgery in the early 2000s, shoulder surgery in 04-05-17 or 2018/04/05, skin grafts on feet in Aug 29, 2021recovered well, family history of obesity: paternal grandmother, father, sister   Mental Health Symptoms Depression:  Change in energy/activity; Difficulty Concentrating; Fatigue; Increase/decrease in appetite; Weight gain/loss; Irritability   Duration of Depressive symptoms: Greater than two weeks   Mania:  None   Anxiety:   Difficulty concentrating; Irritability;  Tension; Worrying; Fatigue   Psychosis:  None   Duration of Psychotic symptoms: No data recorded  Trauma:  None   Obsessions:  None   Compulsions:  None   Inattention:  None   Hyperactivity/Impulsivity:  None   Oppositional/Defiant Behaviors:  None   Emotional Irregularity:  None   Other Mood/Personality Symptoms:  None    Mental Status Exam Appearance and self-care  Stature:  Average   Weight:  Obese   Clothing:  Casual   Grooming:  Normal   Cosmetic use:  None   Posture/gait:  Stooped   Motor activity:  Slowed   Sensorium  Attention:  Normal   Concentration:  Normal   Orientation:  X5   Recall/memory:  Defective in Short-term   Affect and Mood  Affect:  Appropriate   Mood:  Depressed   Relating  Eye contact:  Normal   Facial expression:  Responsive   Attitude toward examiner:  Cooperative   Thought and Language  Speech flow: Clear and Coherent   Thought content:  Appropriate to Mood and Circumstances   Preoccupation:  None   Hallucinations:  None   Organization:  No data recorded  [employer] of Knowledge:  Good   Intelligence:  Average   Abstraction:  Normal   Judgement:  Good   Reality Testing:  Realistic   Insight:  Good   Decision Making:  Normal; Impulsive   Social Functioning  Social Maturity:  Responsible   Social Judgement:  Normal   Stress  Stressors:  Illness   Coping Ability:  Human resources officer Deficits:  Interpersonal; Self-care   Supports:  Family; Church     Religion: Religion/Spirituality Are You A Religious Person?: Yes What is Your Religious Affiliation?: Christian How Might This Affect  Treatment?: Support in treatment  Leisure/Recreation: Leisure / Recreation Do You Have Hobbies?: Yes Leisure and Hobbies: video games, shoot basketball (when physcially able), fishing  Exercise/Diet: Exercise/Diet Do You Exercise?: No Have You Gained or Lost A Significant Amount of  Weight in the Past Six Months?:  (weight flucuates) Do You Follow a Special Diet?: Yes Type of Diet: See above Do You Have Any Trouble Sleeping?: Yes Explanation of Sleeping Difficulties: Difficulty falling asleep at times   CCA Employment/Education Employment/Work Situation: Employment / Work Situation Employment Situation: On disability Why is Patient on Disability: Long Covid, ulcers How Long has Patient Been on Disability: 2 years Patient's Job has Been Impacted by Current Illness: No What is the Longest Time Patient has Held a Job?: 13 years Where was the Patient Employed at that Time?: [employer] Has Patient ever Been in the [employer]?: No  Education: Education Is Patient Currently Attending School?: No Last Grade Completed: 12 Name of High School: [employer] Did Garment/textile technologist From [employer]?: Yes Did Theme park manager?: No Did Designer, television/film set?: No Did You Have Any Special Interests In School?: Arts Did You Have An Individualized Education Program (IIEP): No Did You Have Any Difficulty At School?: Yes Were Any Medications Ever Prescribed For These Difficulties?: No Patient's Education Has Been Impacted by Current Illness: No   CCA Family/Childhood History Family and Relationship History: Family history Marital status: Married Number of Years Married: 24 What types of issues is patient dealing with in the relationship?: physical health changes have impacted relationship Are you sexually active?: No What is your sexual orientation?: Heterosexual Has your sexual activity been affected by drugs, alcohol, medication, or emotional stress?: Physical health and emotional stress Does patient have children?: Yes How many children?: 1 How is patient's relationship with their children?: Son: pretty good  Childhood History:  Childhood History By whom was/is the patient raised?: Both parents, Grandparents Additional childhood history  information: Both parents in the homd. Grandmother helped raised him as well. Patient describes childhood as "good." Description of patient's relationship with caregiver when they were a child: Mother: good,   Father: good Patient's description of current relationship with people who raised him/her: Mother:  fine but they have a lot of health issues and can be over opinionated/overbearing,  Father: fine but strong opinionated How were you disciplined when you got in trouble as a child/adolescent?: spanked, talked to , things taken away, grounded Does patient have siblings?: Yes Number of Siblings: 2 Description of patient's current relationship with siblings: Sister, Brother: pretty good Did patient suffer any verbal/emotional/physical/sexual abuse as a child?: No Did patient suffer from severe childhood neglect?: No Has patient ever been sexually abused/assaulted/raped as an adolescent or adult?: No Was the patient ever a victim of a crime or a disaster?: No Witnessed domestic violence?: No Has patient been affected by domestic violence as an adult?: No  Child/Adolescent Assessment:     CCA Substance Use Alcohol/Drug Use: Alcohol / Drug Use Pain Medications: See patient MAR Prescriptions: See patient MAR Over the Counter: See patient MAR History of alcohol / drug use?: No history of alcohol / drug abuse                         ASAM's:  Six Dimensions of Multidimensional Assessment  Dimension 1:  Acute Intoxication and/or Withdrawal Potential:   Dimension 1:  Description of individual's past and current experiences of substance use and withdrawal: None  Dimension 2:  Biomedical Conditions and Complications:   Dimension 2:  Description of patient's biomedical conditions and  complications: None  Dimension 3:  Emotional, Behavioral, or Cognitive Conditions and Complications:  Dimension 3:  Description of emotional, behavioral, or cognitive conditions and complications: None   Dimension 4:  Readiness to Change:  Dimension 4:  Description of Readiness to Change criteria: None  Dimension 5:  Relapse, Continued use, or Continued Problem Potential:  Dimension 5:  Relapse, continued use, or continued problem potential critiera description: None  Dimension 6:  Recovery/Living Environment:  Dimension 6:  Recovery/Iiving environment criteria description: NOne  ASAM Severity Score: ASAM's Severity Rating Score: 0  ASAM Recommended Level of Treatment:     Substance use Disorder (SUD)    Recommendations for Services/Supports/Treatments: Recommendations for Services/Supports/Treatments Recommendations For Services/Supports/Treatments:  (Bariatric surgery)  DSM5 Diagnoses: Patient Active Problem List   Diagnosis Date Noted   Type 2 diabetes mellitus without complication, without long-term current use of insulin (HCC) 10/18/2022   Vitamin D deficiency 10/18/2022   Other fatigue 09/07/2022   SOBOE (shortness of breath on exertion) 09/07/2022   Nocturnal hypoxemia 09/07/2022   Mood disorder (HCC) 09/07/2022   Ulcer of left foot (HCC) 09/07/2022   Depression 08/23/2022   Prediabetes 08/23/2022   Obstructive sleep apnea 01/11/2022   Insomnia due to other mental disorder 12/06/2021   Left foot drop 09/06/2021   Wheelchair dependence 06/04/2021   Encounter for therapeutic drug monitoring 09/29/2020   COVID-19 long hauler manifesting chronic dyspnea 09/15/2020   Morbid obesity (HCC) with starting BMI 46 09/15/2020   Encounter for attention to tracheostomy (HCC) 09/15/2020   Critical illness polyneuropathy (HCC) 09/15/2020   Moderate protein-calorie malnutrition (HCC) 09/15/2020   Pyogenic inflammation of bone (HCC) 09/09/2020   Long COVID 09/09/2020   Acute osteomyelitis (HCC) 08/31/2020   MSSA (methicillin susceptible Staphylococcus aureus) infection 08/31/2020   Reactive depression 07/01/2020   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   Impaired sensation to light touch 04/15/2020   Diffuse alveolar damage (HCC) 03/09/2020   Eschar of foot    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   History of COVID-19    Pressure injury of skin 08/22/2019   Need for emotional support    Chest tube in place    Personal history of ECMO    Advanced care planning/counseling discussion    Palliative care by specialist    Goals of care, counseling/discussion    Advanced directives, counseling/discussion    Subcutaneous crepitus    Thrombocytopenia (HCC)    Leukopenia    Fever    Gram positive bacterial infection    Septic arthritis of right acromioclavicular joint (HCC) 09/29/2017   Chronic left-sided low back pain with left-sided sciatica 09/19/2017   Epigastric pain    Primary hypertension 04/17/2017   Moderate persistent asthma 07/21/2015   Allergic rhinoconjunctivitis 07/21/2015   GERD (gastroesophageal reflux disease) 07/21/2015   Abnormal gait 11/12/2009   TARSAL TUNNEL SYNDROME, LEFT 10/08/2009   PES PLANUS 10/08/2009    Patient Centered Plan: Patient is on the following Treatment Plan(s):  No treatment plan at this time    Behavioral Health Assessment Patient Name Chiron Umsted Date of Birth 1976/01/14  Age 36 Date of Interview 08.13.2024  Gender Male Date of Report 08.14.2024  Purpose Bariatric/Weight-loss Surgery (pre-operative evaluation)     Assessment Instruments:  DSM-5-TR Self-Rated Level 1 Cross-Cutting Symptom Measure--Adult Severity Measure for Generalized Anxiety Disorder--Adult EAT-26  Chief Complain: Obesity  Client  Background: Patient is a 47 year old Caucasian male seeking weight loss surgery. Patient has a highschool degree and is currently on disability.  Patient is married and has one son. The patient is 5 feet  8 inches tall and 314 lbs., placing him at a BMI of  47.7 classifying him in the obese range and at further risk of co-morbid diseases.  Weight History:  Patient's weight  started to increase in middle school and has slowly increased. After he had COVID in 2020 his weight increased. Patient has tried slim fast, fasting, medication, and changing eating habits with limited success.   Eating Patterns:  Patient is focusing on water intake, increasing his vegetable intake, and increasing his protein intake.   Related Medical Issues:   Patient has been diagnosed with high blood pressure, shortness of breath, difficulty walking, arthritis, and back problems. He has had a back surgery in early 2000's, shoulder surgery in 2018 or 2019, and skin grafts on his feet in Aug 2021. He recovered well from each procedure.   Family History of Obesity:  Patient's paternal grandmother, father, and sister were obese.   Tobacco Use: Patient denies tobacco use.   PATIENT BEHAVIORAL ASSESSMENT SCORES  Personal History of Mental Illness: Patient is actively receiving medication management for depression and anxiety. He has also set up with a therapist for treatment.   Mental Status Examination: Patient was oriented x5 (person, place, situation, time, and object). He was appropriately groomed, and neatly dressed. Patient was alert, engaged, pleasant, and cooperative. Patient denies suicidal and homicidal ideations. Patient denies self-injury. Patient denies psychosis including auditory and visual hallucinations  DSM-5-TR Self-Rated Level 1 Cross-Cutting Symptom Measure--Adult:  Patient rated himself a 1 on the Depression symptom domain indicating "slight, rare, less than a day or two" over a two week period of time symptoms of "Feeling down, depressed, or hopeless." Patient has been unable to work due to complications with long COVID in 2020 that he is still recovering from. Not working and his health issues have impacted his self esteem which in turn impact his mood.  Severity Measure for Generalized Anxiety Disorder--Adult: Patient completed a 10-question scale. Total scores can range from  0 to 40. A raw score is calculated by summing the answer to each question, and an average total score is achieved by dividing the raw score by the number of items (e.g., 10). Patient had a total raw score of 5 out of 40 which was divided by the total number of questions answered (10) to get an average score of . 5 which rounded up to 1 indicates mild clinically significant anxiety.   EAT-26: The EAT-26 is a twenty-six-question screening tool to identify symptoms of eating disorders and disordered eating. The patient scored 4 out of 26. Scores below a 20 are considered not meeting criteria for disordered eating. Patient denies inducing vomiting, or intentional meal skipping. Patient denies binge eating behaviors. Patient denies laxative abuse. Patient does not meet criteria for a DSM-V eating disorder.  Conclusion & Recommendations:   Henrietta Dine Balik's health history and current assessment indicate that she is suitable for bariatric surgery. Patient understands the procedure, the risks associated with it, and the importance of post-operative holistic care (Physical, Spiritual/Values, Relationships, and Mental/Emotional health) with access to resources for support as needed. The patient has made an informed decision to proceed with the procedure. The patient is motivated and expressed understanding of the post-surgical requirements. Patient's psychological assessment will be valid from today's date for  6 months (13/Feb/2025 ). Then, a follow-up appointment will be needed to re-evaluate the patient's psychological status.   I see no significant psychological factors that would hinder the success of bariatric surgery. I support Henrietta Dine Ducey's desire for Bariatric Surgery.   Bynum Bellows, LCSW  Referrals to Alternative Service(s): Referred to Alternative Service(s):   Place:   Date:   Time:    Referred to Alternative Service(s):   Place:   Date:   Time:    Referred to Alternative Service(s):   Place:    Date:   Time:    Referred to Alternative Service(s):   Place:   Date:   Time:      Collaboration of Care: Other provider involved in patient's care [facility]  Patient/Guardian was advised Release of Information must be obtained prior to any record release in order to collaborate their care with an outside provider. Patient/Guardian was advised if they have not already done so to contact the registration department to sign all necessary forms in order for [place] to release information regarding their care.   Consent: Patient/Guardian gives verbal consent for treatment and assignment of benefits for services provided during this visit. Patient/Guardian expressed understanding and agreed to proceed.   Bynum Bellows, LCSW

## 2023-03-23 ENCOUNTER — Encounter (HOSPITAL_BASED_OUTPATIENT_CLINIC_OR_DEPARTMENT_OTHER): Payer: PPO | Admitting: General Surgery

## 2023-03-23 ENCOUNTER — Ambulatory Visit: Payer: PPO | Admitting: Dietician

## 2023-03-23 DIAGNOSIS — L97522 Non-pressure chronic ulcer of other part of left foot with fat layer exposed: Secondary | ICD-10-CM | POA: Diagnosis not present

## 2023-03-23 DIAGNOSIS — L89623 Pressure ulcer of left heel, stage 3: Secondary | ICD-10-CM | POA: Diagnosis not present

## 2023-03-30 ENCOUNTER — Encounter (HOSPITAL_BASED_OUTPATIENT_CLINIC_OR_DEPARTMENT_OTHER): Payer: PPO | Admitting: General Surgery

## 2023-03-30 DIAGNOSIS — L89623 Pressure ulcer of left heel, stage 3: Secondary | ICD-10-CM | POA: Diagnosis not present

## 2023-03-30 DIAGNOSIS — L97522 Non-pressure chronic ulcer of other part of left foot with fat layer exposed: Secondary | ICD-10-CM | POA: Diagnosis not present

## 2023-03-30 NOTE — Progress Notes (Signed)
SETON, ALDRIDGE ([id number]) [id number] Page 1 of 17 Visit Report for 03/30/2023 Chief Complaint Document Details Patient Name: Date of Service: [name], TO [name] 03/30/2023 9:30 A M Medical Record Number: [id number] Patient Account Number: [id number] Date of Birth/Sex: Treating RN: 10-31-1975 (47 y.o. M) Primary Care Provider: Dorinda Hill Other Clinician: Referring Provider: Treating Provider/Extender: Jana Hakim in Treatment: 141 Information Obtained from: Patient Chief Complaint Bilateral Plantar Foot Ulcers Electronic Signature(s) Signed: 03/30/2023 10:47:48 AM By: [name] MD FACS Entered By: [name] on 03/30/2023 07:47:48 -------------------------------------------------------------------------------- Debridement Details Patient Name: Date of Service: [name], [name] 03/30/2023 9:30 A M Medical Record Number: [id number] Patient Account Number: [id number] Date of Birth/Sex: Treating RN: 04-Apr-1976 (47 y.o. [name] Primary Care Provider: Dorinda Hill Other Clinician: Referring Provider: Treating Provider/Extender: Jana Hakim in Treatment: 141 Debridement Performed for Assessment: Wound #2 Left Calcaneus Performed By: Physician [name], MD Debridement Type: Debridement Level of Consciousness (Pre-procedure): Awake and Alert Pre-procedure Verification/Time Out Yes - 09:53 Taken: Start Time: 09:54 Pain Control: Lidocaine 5% topical ointment Percent of Wound Bed Debrided: 100% T Area Debrided (cm): otal 4.12 Tissue and other material debrided: Non-Viable, Slough, Slough Level: Non-Viable Tissue Debridement Description: Selective/Open Wound Instrument: Curette Bleeding: Minimum Hemostasis Achieved: Pressure Response to Treatment: Procedure was tolerated well Level of Consciousness (Post- Awake and Alert procedure): Post Debridement Measurements of Total  Wound Length: (cm) 3.5 Stage: Category/Stage III Width: (cm) 1.5 Depth: (cm) 0.2 Volume: (cm) 0.825 Character of Wound/Ulcer Post Debridement: Requires Further Debridement Post Procedure Diagnosis Same as DAKWON, RIPPON ([id number]) [id number] Page 2 of 17 Notes Scribed for Dr. Lady Gary by [name], RN Electronic Signature(s) Signed: 03/30/2023 12:59:10 PM By: [name] MD FACS Signed: 03/30/2023 1:05:02 PM By: [name] RN Entered By: [name] on 03/30/2023 06:55:59 -------------------------------------------------------------------------------- HPI Details Patient Name: Date of Service: [name], [name] 03/30/2023 9:30 A M Medical Record Number: [id number] Patient Account Number: [id number] Date of Birth/Sex: Treating RN: 16-Feb-1976 (47 y.o. M) Primary Care Provider: Dorinda Hill Other Clinician: Referring Provider: Treating Provider/Extender: Jana Hakim in Treatment: 141 History of Present Illness HPI Description: Wounds are12/03/2020 upon evaluation today patient presents for initial inspection here in our clinic concerning issues he has been having with the bottoms of his feet bilaterally. He states these actually occurred as wounds when he was hospitalized for 5 months secondary to Covid. He was apparently with tilting bed where he was in an upright position quite frequently and apparently this occurred in some way shape or form during that time. Fortunately there is no sign of active infection at this time. No fevers, chills, nausea, vomiting, or diarrhea. With that being said he still has substantial wounds on the plantar aspects of his feet Theragen require quite a bit of work to get these to heal. He has been using Santyl currently though that is been problematic both in receiving the medication as well as actually paid for it as it is become quite expensive. Prior to the experience with Covid  the patient really did not have any major medical problems other than hypertension he does have some mild generalized weakness following the Covid experience. 07/22/2020 on evaluation today patient appears to be doing okay in regard to his foot ulcers I feel like the wound beds are showing signs of better improvement that I do believe the Iodoflex is helping in this regard. With that being said he  does have a lot of drainage currently and this is somewhat blue/green in nature which is consistent with Pseudomonas. I do think a culture today would be appropriate for [place] to evaluate and see if that is indeed the case I would likely start him on antibiotic orally as well he is not allergic to Cipro knows of no issues he has had in the past 12/21; patient was admitted to the clinic earlier this month with bilateral presumed pressure ulcers on the bottom of his feet apparently related to excessive pressure from a tilt table arrangement in the intensive care unit. Patient relates this to being on ECMO but I am not really sure that is exactly related to that. I must say I have never seen anything like this. He has fairly extensive full-thickness wounds extending from his heel towards his midfoot mostly centered laterally. There is already been some healing distally. He does not appear to have an arterial issue. He has been using gentamicin to the wound surfaces with Iodoflex to help with ongoing debridement 1/6; this is a patient with pressure ulcers on the bottom of his feet related to excessive pressure from a standing position in the intensive care unit. He is complaining of a lot of pain in the right heel. He is not a diabetic. 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 We have been using Iodoflex to help prepare the surfaces of both wounds for an advanced treatment product. He is nonambulatory spending most of his time in a wheelchair I have asked him not to propel the wheelchair  with his heels 1/13; in general his wounds look better not much surface area change we have been using Iodoflex as of last week. I did an x-ray of the right heel as the patient was complaining of pain. I had some thoughts about a stress fracture perhaps Achilles tendon problems however what it showed was erosive changes along the inferior aspect of the calcaneus he now has a MRI booked for 1/20. 1/20; in general his wounds continue to be better. Some improvement in the large narrow areas proximally in his foot. He is still complaining of pain in the right heel and tenderness in certain areas of this wound. His MRI is tonight. I am not just looking for osteomyelitis that was brought up on the x-ray I am wondering about stress fractures, tendon ruptures etc. He has no such findings on the left. Also noteworthy is that the patient had critical illness neuropathy and some of the discomfort may be actual improvement in nerve function I am just not sure. These wounds were initially in the setting of severe critical illness related to COVID-19. He was put in a standing position. He may have also been on pressors at the point contributing to tissue ischemia. By his description at some point these wounds were grossly necrotic extending proximally up into the Achilles part of his heel. I do not know that I have ever really seen pictures of them like this although they may exist in epic We have ordered Tri layer Oasis. I am trying to stimulate some granulation in these areas. This is of course assuming the MRI is negative for infection 1/27; since the patient was last here he saw Dr. Earlene Plater of infectious disease. He is planned for vancomycin and ceftriaxone. Prior operative culture grew MSSA. Also ordered baseline lab work. He also ordered arterial studies although the ABIs in our clinic were normal as well as his clinical exam these  were normal I do not think he needs to see vascular surgery. His ABIs at the  PTA were 1.22 in the right triphasic waveforms with a normal TBI of 1.15 on the left ABI of 1.22 with triphasic waveforms and a normal TBI of 1.08. Finally he saw Dr. Logan Bores who will follow him in for 2 months. At this point I do not think he felt that he needed a procedure on the right calcaneal bone. Dr. Earlene Plater is elected for broad-spectrum antibiotic The patient is still having pain in the right heel. He walks with a walker 2/3; wounds are generally smaller. He is tolerating his IV antibiotics. I believe this is vancomycin and ceftriaxone. We are still waiting for Oasis burn in terms of his out-of-pocket max which he should be meeting soon given the IV antibiotics, MRIs etc. I have asked him to check in on this. We are using silver collagen in the meantime the wounds look better 2/10; tolerating IV vancomycin and Rocephin. We are waiting to apply for Oasis. Although I am not really sure where he is in his out-of-pocket max. 2/17 started the first application of Oasis trilayer. Still on antibiotics. The wounds have generally look better. The area on the left has a little more surface slough requiring debridement 2/24; second application of Oasis trilayer. The wound surface granulation is generally look better. The area on the left with undermining laterally I think is come in a bit. KYDEN, TARRENCE ([id number]) [id number] Page 3 of 17 10/08/2020 upon evaluation today patient is here today for [employer] application #3. Fortunately he seems to be doing extremely well with regard to this and we are seeing a lot of new epithelial growth which is great news. Fortunately there is no signs of active infection at this time. 10/16/2020 upon evaluation today patient appears to be doing well with regard to his foot ulcers. Do believe the Oasis has been of benefit for him. I do not see any signs of infection right now which is great news and I think that he has a lot of new  epithelial growth which is great to see as well. The patient is very pleased to hear all of this. I do think we can proceed with the Oasis trilayer #4 today. 3/18; not as much improvement in these areas on his heels that I was hoping. I did reapply trilateral Oasis today the tissue looks healthier but not as much fill in as I was hoping. 3/25; better looking today I think this is come in a bit the tissue looks healthier. Triple layer Oasis reapplied #6 4/1; somewhat better looking definitely better looking surface not as much change in surface area as I was hoping. He may be spending more time [place] on days then he needs to although he does have heel offloading boots. Triple layer Oasis reapplied #7 4/7; unfortunately apparently [facility] will not approve any further Oasis which is unfortunate since the patient did respond nicely both in terms of the condition of the wound bed as well as surface area. There is still some drainage coming from the wound but not a lot there does not appear to be any infection 4/15; we have been using Hydrofera Blue. He continues to have nice rims of epithelialization on the right greater than the left. The left the epithelialization is coming from the tip of his heel. There is moderate drainage. In this that concerns me about a total contact cast. There is no evidence  of infection 4/29; patient has been using Hydrofera Blue with dressing changes. He has no complaints or issues today. 5/5; using Hydrofera Blue. I actually think that he looks marginally better than the last time I saw this 3 weeks ago. There are rims of epithelialization on the left thumb coming from the medial side on the right. Using Hydrofera Blue 5/12; using Hydrofera Blue. These continue to make improvements in surface area. His drainage was not listed as severe I therefore went ahead and put a cast on the left foot. Right foot we will continue to dress his previous 5/16; back for first  total contact cast change. He did not tolerate this particularly well cast injury on the anterior tibia among other issues. Difficulty sleeping. I talked him about this in some detail and afterwards is elected to continue. I told him I would like to have a cast on for 3 weeks to see if this is going to help at all. I think he agreed 5/19; I think the wound is better. There is no tunneling towards his midfoot. The undermining medially also looks better. He has a rim of new skin distally. I think we are making progress here. The area on the left also continues to look somewhat better to me using Hydrofera Blue. He has a list of complaints about the cast but none of them seem serious 5/26; patient presents for 1 week follow-up. He has been using a total contact cast and tolerating this well. Hydrofera Blue is the main dressing used. He denies signs of infection. 6/2 Hydrofera Blue total contact cast on the left. These were large ulcers that formed in intensive care unit where the patient was recovering from COVID. May have had something to do with being ventilated in an upright positiono Pressors etc. We have been able to get the areas down considerably and a viable surface. There is some epithelialization in both sides. Note made of drainage 6/9; changed to [facility] last time because of drainage. He arrives with better looking surfaces and dimensions on the left than the right. Paradoxically the right actually probes more towards his midfoot the left is largely close down but both of these look improved. Using a total contact cast on the left 6/16; complex wounds on his bilateral plantar heels which were initially pressure injury from a stay in the ICU with COVID. We have been using silver alginate most recently. His dimensions of come in quite dramatically however not recently. We have been putting the left foot in a total contact cast 6/23; complex wounds on the bilateral plantar heels. I been  putting the left in the cast paradoxically the area on the right is the one that is going towards closure at a faster rate. Quite a bit of drainage on the left. The patient went to see Dr. Logan Bores who said he was going to standby for skin grafts. I had actually considered sending him for skin grafts however he would be mandatorily off his feet for a period of weeks to months. I am thinking that the area on the right is going to close on its own the area on the left has been more stubborn even though we have him in a total contact cast 6/30; took him out of a total contact cast last week is the right heel seem to be making better progress than the left where I was placing the cast. We are using silver alginate. Both wounds are smaller right greater than left 7/12;  both wounds look as though they are making some progress. We are using silver alginate. Heel offloading boots 7/26; very gradual progress especially on the right. Using silver alginate. He is wearing heel offloading boots 8/18; he continues to close these wounds down very gradually. Using silver alginate. The problem polymen being definitive about this is areas of what appears to be callus around the margins. This is not a 100% of the area but certainly sizable especially on the right 9/1; bilateral plantar feet wounds secondary to prolonged pressure while being ventilated for COVID-19 in an upright position. Essentially pressure ulcers on the bottom of his feet. He is made substantial progress using silver alginate. 9/14; bilateral plantar feet wounds secondary to prolonged pressure. Making progress using silver alginate. 9/29 bilateral plantar feet wounds secondary to prolonged pressure. I changed him to Iodoflex last week. MolecuLight showing reddened blush fluorescence 10/11; patient presents for follow-up. He has no issues or complaints today. He denies signs of infection. He continues to use Iodoflex and antibiotic ointment to the wound  beds. 10/27; 2-week follow-up. No evidence of infection. He has callus and thick dry skin around the wound margins we have been using Iodoflex and Bactroban which was in response to a moderate left MolecuLight reddish blush fluorescence. 11/10; 2-week follow-up. Wound margins again have thick callus however the measurements of the actual wound sites are a lot smaller. Everything looks reasonably healthy here. We have been using Iodoflex He was approved for prime matrix but I have elected to delay this given the improvement in the surface area. Hopefully I will not regret that decision as were getting close to the end of the year in terms of insurance payment 12/8; 2-week follow-up. Wounds are generally smaller in size. These were initially substantial wounds extending into the forefoot all the way into the heel on the bilateral plantar feet. They are now both located on the plantar heel distal aspect both of these have a lot of callus around the wounds I used a #5 curette to remove this on the right and the left also some subcutaneous debris to try and get the wound edges were using Iodoflex. He has heel offloading shoe 12/22; 2-week follow-up. Not really much improvement. He has thick callus around the outer edges of both wounds. I remove this there is some nonviable subcutaneous tissue as well. We have been using Iodoflex. Her intake nurse and myself spontaneously thought of a total contact cast I went back in May. At that time we really were not seeing much of an improvement with a cast although the wound was in a much different situation I would like to retry this in 2 weeks and I discussed this with the patient 08/12/2021; the patient has had some improvement with the Iodoflex. The the area on the left heel plantar more improved than the right. I had to put him in a total contact cast on the left although I decided to put that off for 2 weeks. I am going to change his primary dressing to silver  collagen. I think in both areas he has had some improvement most of the healing seems to be more proximal in the heel. The wounds are in the mid aspect. A lot of thick callus on the right heel however. 1/19; we are using silver collagen on both plantar heel areas. He has had some improvement today. The left did not require any debridement. He still had some eschar on the right that was debrided but both  seem to have contracted. I did not put it total contact cast on him today 2/2 we have been using silver collagen. The area on the right plantar heel has areas that appear to be epithelialized interspersed with dry flaking callus and dry skin. I removed this. This really looks better than on the other side. On the left still a large area with raised edges and debris on the surface. 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 He has no issues or complaints today. 2/9; not much change in the left foot wound with 1 week of a cast we are using silver collagen. Silver collagen on the right side. The right side has been the better wound surface. We will reapply the total contact cast on the left 2/16; not much improvement on either side I been using silver collagen with a total contact cast on the left. I'm changing the Hydrofera Blue still with a total contact cast on the left 2/23; some improvement on both sides. Disappointing that he has thick callus around the area that we are putting in a total contact cast on the left. We've been using Hydrofera Blue on both wound areas. SULEMAN, WOOLS ([id number]) [id number] Page 4 of 17 This is a man who at essentially pressure ulcers in addition to ischemia caused by medications to support his blood pressure (pressors) in the ICU. He was being ventilated in the standing position for severe Covid. A Shiley  the wounds extended across his entire foot but are now localized to his plantar heels bilaterally. We have made progress however neither areas healed. I continue to think the total contact cast is helped albeit painstakingly slowly. He has never wanted a plastic surgery consult although I don't know that they would be interested in grafting in area in this location. 10/07/2021: Continued improvement bilaterally. There is still some callus around the left wound, despite the total contact cast. He has some increased pain in his right midfoot around 1 particular area. This has been painful in the past but seems to be a little bit worse. When his cast was removed today, there was an area on the heel of the left foot that looks a bit macerated. He is also complaining of pain in his left thigh and hip which he thinks is secondary to the limb length discrepancy caused by the cast. 10/14/2021: He continues to improve. A little bit less callus around the left wound. He continues to endorse pain in his right midfoot, but this is not as significant as it was last week. The maceration on his left heel is improved. 10/21/2021: Continued improvement to both wounds. The maceration on his left heel is no longer evident. Less callus bilaterally. Epithelialization progressing. 10/28/2021: Significant improvement this week. The right sided wound is nearly closed with just a small open area at the middle. No maceration seen on the left heel. Continued epithelialization on both sides. No concern for infection. 11/04/2021: T oday, the wounds were measured a little bit differently and come out as larger, but I actually think they are about the same to potentially even smaller, particularly on the left. He continues to accumulate some callus on the right. 11/11/2021: T oday, the patient is expressing some concern that the left wound, despite being in the total contact cast, is not progressing at the same rate as the 1 on  the right.  He is interested in trying a week without the cast to see how the wound does. The wounds are roughly the same size as last week, with the right perhaps being a little bit smaller. He continues to build up callus on both sites. 11/18/2021: Last week, I permitted the patient to go without his total contact cast, just to see if the cast was really making any difference. Today, both wounds have deteriorated to some extent, suggesting that the cast is providing benefit, at least on the left. Both are larger and have accumulated callus, slough, and other debris. 11/26/2021: I debrided both wounds quite aggressively last week in an effort to stimulate the healing cascade. This appears to have been effective as the left sided wound is a full centimeter shorter in length. Although the right was measured slightly larger, on inspection, it looks as though an area of epithelialized tissue was included in the measurements. We have been using PolyMem Ag on the wound surfaces with a total contact cast to the left. 12/02/2021: It appears that the intake personnel are including epithelialized tissue in his wound measurements; the right wound is almost completely epithelialized; there is just a crater at the proximal midfoot with some open areas. On the left, he has built up some callus, but the overall wound surface looks good. There is some senescent skin around the wound margin. He has been in PolyMem Ag bilaterally with a total contact cast on the left. 12/09/2021: The right wound is nearly closed; there is just a small open area at the mid calcaneus. On the left, the wound is smaller with minimal callus buildup. No significant drainage. 12/16/2021: The right calcaneal wound remains minimally open at the mid calcaneus; the rest has epithelialized. On the left, the wound is also a little bit smaller. There is some senescent tissue on the periphery. He is getting his first application of a trial skin substitute called  Vendaje today. 12/23/2021: The wound on his right calcaneus is nearly closed; there is just a small area at the most distal aspect of the calcaneus that is open. On the left, the area where we applied to the skin substitute has a healthier-looking bed of granulation tissue. The wound dimensions are not significantly different on this side but the wound surface is improved. 12/30/2021: The wound on the right calcaneus has not changed significantly aside from some accumulation of callus. On the left, the open area is smaller and continues to have an improved surface. He continues to accumulate callus around the wound. He is here for his third application of Vendaje. 01/06/2022: The right calcaneal wound is down to just a couple of millimeters. He continues to accumulate periwound callus. He unfortunately got his cast wet earlier in the week and his left foot is macerated, resulting in some superficial skin loss just distal to the open wound. The open wound itself, however, is much smaller and has a healthier appearing surface. He is here for his fourth application of Vendaje. 01/13/2022: The right calcaneal wound is about the same. Unfortunately, once again, his cast got wet and his foot is again macerated. This is caused the left calcaneal wound to enlarge. He is here for his fifth application of Vendaje. 01/20/2022: The right calcaneal wound is very small. There is some periwound callus accumulation. He purchased a new cast protector last week and this has been effective in avoiding the maceration that has been occurring on the left. The left calcaneal wound is  narrower and has a healthy and viable-appearing surface. He is here for his 6 application of Vendaje. 01/27/2022: The right calcaneal wound is down to just a pinhole. There is some periwound slough and callus. On the left, the wound is narrower and shorter by about a centimeter. The surface is robust and viable-appearing. Unfortunately, the rep for the  trial skin substitute product did not provide any for [place] to use today. 02/04/2022: The right calcaneal wound remains unchanged. There is more accumulated callus. On the left, although the intake nurse measured it a little bit longer, it looks about the same to me. The surface has a layer of slough, but underneath this, there is good granulation tissue. 02/10/2022: The right calcaneus wound is nearly closed. There is still some callus that builds up around the site. The left side looks about the same in terms of dimensions, but the surface is more robust and vital-appearing. 02/16/2022: The area of the right calcaneus that was nearly closed last week has closed, but there is a small opening at the mid foot where it looks like some moisture got retained and caused some reopening. The left foot wound is narrower and shallower. Both sites have a fair amount of periwound callus and eschar. 02/24/2022: The small midfoot opening on the right calcaneus is a little bit smaller today. The left foot wound is narrower and shallower. He continues to accumulate periwound callus. No concern for infection. 03/01/2022: The patient came to clinic early because he showered and got his cast wet. Fortunately, there is no significant maceration to his foot but the callus softened and it looks like the wound on his left calcaneus may be a little bit wider. The wound on his right calcaneus is just a narrow slit. Continued accumulation of periwound callus bilaterally. 03/08/2022: The wound on his right calcaneus is very nearly closed, just a small pinpoint opening under a bit of eschar; the left wound has come in quite a bit, as well. It is narrower and shorter than at our last visit. Still with accumulated callus and eschar bilaterally. 03/17/2022: The right calcaneal wound is healed. The left wound is smaller and the surface itself is very clean, but there is some blue-green staining on the periwound callus, concerning for  Pseudomonas aeruginosa. 03/23/2022: The right calcaneal wound remains closed. The left wound continues to contract. No further blue-green staining. Small amount of callus and slough accumulation. 03/28/2022: He came in early today because he had gotten his cast wet. On inspection, the wound itself did not get wet or macerated, just a little bit of the forefoot. The wound itself is basically unchanged. HEZZIE, WILCKEN ([id number]) [id number] Page 5 of 17 04/07/2022: The right foot wound remains closed. The left wound is the smallest that I have seen it to date. It is narrower and shorter. It still continues to accumulate slough on the surface. 04/15/2022: There is a band of epithelium now dividing the small left plantar foot wound in 2. There is still some slough on the surface. 04/21/2022: The wound continues to narrow. Just a little bit of slough on the surface. He seems to be responding well to endoform. 04/28/2022: Continued slow contraction of the wound. There is a little slough on the surface and some periwound callus. We have been using endoform and total contact cast. 05/05/2022: The wound appears to have stalled. There is slough and some periwound eschar/callus. No concern for infection, however. 05/12/2022: Unfortunately, his right foot has reopened. It is located  at the most posterior aspect of his surgical incision. The area was noted to have drainage coming from it when his padding was removed today. Underneath some callus and senescent skin, there is an opening. No purulent drainage or malodor. On the left foot, the wound is again unchanged. There is some light blue staining on the callus, but no malodor or purulent drainage. 10/13; right and left heel remanence of extensive plantar foot wounds. These are better than I remember by quite a big margin however he is still left with wounds on the left plantar heel and the right plantar heel. Been using endoform  bilaterally. A culture was done that showed apparently Pseudomonas but we are still waiting for the [facility] antibiotic to use gentamicin today. He is still very active by description I am not sure about the offloading of his noncasted right foot 10/20; both wounds right and left heel debrided not much change from last week. [name] Echevaria has arrived which is linezolid, gentamicin and ciprofloxacin we will use this with endoform. T contact cast on the left otal 06/02/2022: Both wounds are smaller today. There is still a fair amount of callus buildup around the right foot ulcer. The left is more superficial and nearly flush with the surrounding tissues. Also with slough and eschar buildup. 06/10/2022: The right sided wound appears to be nearly closed, if not completely so, although it is somewhat difficult to tell given the abnormal tissue and scarring in his foot. There is a fair amount of callus and crust accumulation. On the left, the wound looks about the same, again with callus and slough. He has an appointment next Thursday with Dr. Annamary Rummage in podiatry; I am hopeful that there may be some reconstructive options available for Mr. Parlette. 06/16/2022: Both wounds have some eschar and callus accumulation. The right sided wound is extremely narrow and barely open; the left is narrower than last week. There is a little bit of slough. He has his appointment in podiatry later today. 06/23/2022: The patient met with Dr. Annamary Rummage last week and unfortunately, there are no reconstructive options that he believes would be helpful. He did order an MRI to evaluate for osteomyelitis and fortunately, none was seen. The left sided wound is a little bit shorter and narrower today. The right sided wound is about the same. There is callus and eschar accumulation bilaterally. 06/29/2022: Both feet have improved from last week. There is epithelium making a valiant effort to creep across the surface on the left. The  right side looks like it got a little dry and the deep crevasse in his midfoot has cracked. Both have eschar and there is some slough on the left. 07/07/2022: Both feet have improved. There is epithelium completely covering the calcaneus on the right with just a small opening in the crevasse in his midfoot. On the left, the open area of tissue is smaller but he continues to build up callus/eschar and slough. 07/15/2022: The opening in the midfoot on the right is about the same size, covered with eschar and a little bit of slough. The open portion of the left wound is narrower and shorter with a bit of slough buildup. He admits to being on his feet more than recommended. 12/14; as far as I can tell everything on the right foot is closed. There is some eschar I removed some of this I cannot identify any open wound here. As usual this will be a very vulnerable area going forward. On the left this looks  really quite healthy. I was pleasantly surprised to see how good this looked. Wound is certainly smaller and there appears to be healthy epithelialization. He has been using Promogran on the right and endoform on the left. He has been offloading the right foot with a heel offloading boot and he has a running shoe on the right foot 08/04/2022: The right foot remains closed. He has a thick cushioned insole in his sneaker. The left sided wound is smaller with just some slough and eschar accumulation. He is wearing the heel off loader on this foot. 08/15/2022: The right foot remains closed. The left wound has narrowed further. There is some slough and eschar accumulation. 08/25/2022: We put him in a peg assist shoe insert and as a result, he has more epithelialization of the ulcer with minimal slough and eschar accumulation. 09/01/2022: The wound is smaller by about half this week. Still with some slough on the surface. The peg assist shoe seems to be doing a remarkable job of adequately offloading the  site. 09/08/2022: There is a little bit more epithelium coming in. There is some slough and callus buildup. 09/16/2022: The wound measures about the same size, but the epithelium that has grown in looks more robust and stronger. There is some slough and callus buildup. 09/23/2022: The wound remains about the same size. The skin edges are looking rather senescent. 09/29/2022: The aggressive debridement I performed last week seems to have been effective. The wound is smaller and has significantly less slough accumulation. 10/06/2022: There has been quite a bit of epithelialization since last week. There are still some open areas with slough accumulation. There is some callus buildup around the perimeter. 10/13/2022: Continued improvement. Minimal callus accumulation. 3/14; patient presents for follow-up. He has been using endoform to the wound bed without issues. He is using a surgical shoe with peg assist for offloading. 10/27/2022: The wound dimensions are stable. There is some senescent skin accumulation around the perimeter. 11/04/2022: Last week I performed a very aggressive debridement in an effort to stimulate the healing cascade. As has been the case when I have done this before, the wound has responded well. There has been epithelialization and contraction of the wound. There are just a couple of small open areas. 11/10/2022: Unfortunately, his foot got wet secondary to sweating and there has been some breakdown of the tissue, particularly the skin just adjacent to the main ulcer. 11/18/2022: We continue to struggle with moisture-related tissue breakdown around the perimeter of his wound. This has caused the thin epithelium that had formed on the surface to disintegrate. The underlying surface of the wound has good granulation tissue. KLAY, QUISENBERRY ([id number]) [id number] Page 6 of 17 11/25/2022: The edges of the wound are much less macerated, but the surface is actually  looking a little bit dry. There is slough accumulation. No obvious signs of infection. 12/01/2022: The wound edges are more macerated and broken down, making the wound larger. The surface has some slough on it. 12/08/2020: The wound looks better this week. There is significantly less maceration and moisture-related tissue breakdown. There is some slough on the wound surface. 12/15/2022: The wound continues to improve. There is almost no moisture-related tissue breakdown. There is epithelium beginning to fill in again from the edges. Light slough on the wound surface. 12/22/2022: More epithelium has filled him from around the edges. There is some slough on the surface. No evidence of moisture-related tissue breakdown. 01/05/2023: There has been more epithelialization, particularly at the posterior  calcaneal aspect of the wound. There has been a little bit of moisture accumulation with minimal maceration. 01/12/2023: More epithelium has filled in. There is very minimal accumulation of slough. 01/20/2023: The wound is measuring a little bit smaller today. It did measure deeper, but this appears to be secondary to some heaped up senescent skin around the edges. The epithelium that has been present at the posterior aspect of the wound seems to be a bit more robust with greater integrity. 01/26/2023: He reports intense itching to the skin around the wound and there has been some breakdown here. It looks fungal in nature. The wound itself looks pretty good with improved epithelialization with just some slough buildup. He has a little callus along the wound edges. 02/02/2023: He has been treating the periwound skin with an over-the-counter athlete's foot medication and notes significant improvement in his pruritus. The skin looks better here, as well. The wound continues to epithelialize. There is light slough accumulation. 02/10/2023: The periwound skin still looks fairly angry. He reports multiple small blisters that  have opened up. He is not having the intense itching that he had prior, however. There has been more epithelialization at the calcaneus and there is minimal slough accumulation. 02/16/2023: The periwound skin is improving. An online review of dermatology rash images suggest that this may be hand-foot-and-mouth disease, isolated to the feet; the patient says he actually had this as a child. The calcaneus continues to epithelialize and the tissue was quite robust here. Very minimal slough accumulation on the open portion of the wound with a little bit of periwound callus buildup. 03/03/2023: The wound measurements are about the same, but there is more epithelium making its way to the surface. There is a little bit of crust and eschar around the edges and minimal slough on the surface. 03/09/2023: His dressing was applied rather haphazardly and the drawtex was not applied. The Optifoam was also reversed such that the orange side was in contact with his bare skin. He has some maceration around the wound edges, but this did not appear to result in any tissue breakdown. There is a little bit of slough on the surface and minimal eschar around the edges. 03/15/2023: The wound measured narrower today. There is some slough accumulation on the surface and some eschar around the edges. 03/23/2023: The length of the wound remains stable, but it continues to narrow. The skin that has covered the posterior aspect of the calcaneus is more robust and has better tensile integrity. Minimal slough and eschar accumulation. 03/30/2023: The wound length has decreased. This seems to be primarily due to more epithelium covering the posterior aspect of the site. Minimal slough and eschar buildup. Electronic Signature(s) Signed: 03/30/2023 10:49:27 AM By: [name] MD FACS Entered By: [name] on 03/30/2023 07:49:27 -------------------------------------------------------------------------------- Physical Exam  Details Patient Name: Date of Service: [name], TO [name] 03/30/2023 9:30 A M Medical Record Number: [id number] Patient Account Number: [id number] Date of Birth/Sex: Treating RN: March 25, 1976 (47 y.o. M) Primary Care Provider: Dorinda Hill Other Clinician: Referring Provider: Treating Provider/Extender: Jana Hakim in Treatment: 141 Constitutional . . . . no acute distress. Respiratory Normal work of breathing on room air. Notes 03/30/2023: The wound length has decreased. This seems to be primarily due to more epithelium covering the posterior aspect of the site. Minimal slough and eschar buildup. Electronic Signature(s) Signed: 03/30/2023 10:49:55 AM By: [name] MD [name] ([id number]) [id number] Page 7 of 17 Entered By:  [name] on 03/30/2023 07:49:55 -------------------------------------------------------------------------------- Physician Orders Details Patient Name: Date of Service: [name] 03/30/2023 9:30 A M Medical Record Number: [id number] Patient Account Number: [id number] Date of Birth/Sex: Treating RN: Dec 22, 1975 (47 y.o. [name] Primary Care Provider: Dorinda Hill Other Clinician: Referring Provider: Treating Provider/Extender: Jana Hakim in Treatment: [phone] Verbal / Phone Orders: No Diagnosis Coding ICD-10 Coding Code Description [phone] Non-pressure chronic ulcer of other part of left foot with fat layer exposed Follow-up Appointments ppointment in 1 week. - Dr. Lady Gary Room 3 Return A Anesthetic Wound #2 Left Calcaneus (In clinic) Topical Lidocaine 4% applied to wound bed - In clinic Bathing/ Shower/ Hygiene May shower and wash wound with soap and water. Edema Control - Lymphedema / SCD / Other Bilateral Lower Extremities Avoid standing for long periods of time. Moisturize legs daily. - as needed Off-Loading Other: - keep pressure off of the  bottom of your feet. Elevate legs throughout the day. Use the Shoe with the PegAssist off-loading insole Additional Orders / Instructions Follow Nutritious Diet - Try to get 70-100 grams of Protein a day+ Wound Treatment Wound #2 - Calcaneus Wound Laterality: Left Cleanser: Normal Saline (Generic) 1 x Per Day/30 Days Discharge Instructions: Cleanse the wound with Normal Saline prior to applying a clean dressing using gauze sponges, not tissue or cotton balls. Cleanser: Wound Cleanser (Generic) 1 x Per Day/30 Days Discharge Instructions: Cleanse the wound with wound cleanser prior to applying a clean dressing using gauze sponges, not tissue or cotton balls. Peri-Wound Care: Ketoconazole Cream 2% 1 x Per Day/30 Days Discharge Instructions: Apply Ketoconazole mixed with zinc to the periwound Peri-Wound Care: Zinc Oxide Ointment 30g tube 1 x Per Day/30 Days Discharge Instructions: Apply Zinc Oxide to periwound with each dressing change Topical: Gentamicin (Generic) 1 x Per Day/30 Days Discharge Instructions: apply to the wound bed Prim Dressing: Maxorb Extra Ag+ Alginate Dressing, 4x4.75 (in/in) (Generic) 1 x Per Day/30 Days ary Discharge Instructions: Apply to wound bed as instructed Secondary Dressing: Drawtex 4x4 in (Generic) 1 x Per Day/30 Days Discharge Instructions: Apply over primary dressing as directed. Secondary Dressing: Optifoam Non-Adhesive Dressing, 4x4 in (Generic) 1 x Per Day/30 Days Discharge Instructions: Apply over primary dressing as directed. Secondary Dressing: Woven Gauze Sponge, Non-Sterile 4x4 in (Generic) 1 x Per Day/30 Days Discharge Instructions: Apply over primary dressing as directed. REVEN, RAPTIS ([id number]) [id number] Page 8 of 17 Secured With: 45M Medipore H Soft Cloth Surgical T ape, 4 x 10 (in/yd) (Generic) 1 x Per Day/30 Days Discharge Instructions: Secure with tape as directed. Compression Wrap: Kerlix Roll 4.5x3.1 (in/yd)  (Generic) 1 x Per Day/30 Days Discharge Instructions: Apply Kerlix and Coban compression as directed. Add-Ons: [employer] Offloading Boot, Size Regular 1 x Per Day/30 Days Electronic Signature(s) Signed: 03/30/2023 12:59:10 PM By: [name] MD FACS Previous Signature: 03/30/2023 9:48:49 AM Version By: [name] RN Entered By: [name] on 03/30/2023 07:50:13 -------------------------------------------------------------------------------- Problem List Details Patient Name: Date of Service: [name], [name] 03/30/2023 9:30 A M Medical Record Number: [id number] Patient Account Number: [id number] Date of Birth/Sex: Treating RN: Dec 13, 1975 (47 y.o. M) Primary Care Provider: Dorinda Hill Other Clinician: Referring Provider: Treating Provider/Extender: Jana Hakim in Treatment: [phone] Active Problems ICD-10 Encounter Code Description Active Date MDM Diagnosis L97.522 Non-pressure chronic ulcer of other part of left foot with fat layer exposed 09/03/2020 No Yes Inactive Problems ICD-10 Code Description Active Date Inactive Date L97.512 Non-pressure  chronic ulcer of other part of right foot with fat layer exposed 09/03/2020 09/03/2020 L89.893 Pressure ulcer of other site, stage 3 07/15/2020 07/15/2020 M62.81 Muscle weakness (generalized) 07/15/2020 07/15/2020 I10 Essential (primary) hypertension 07/15/2020 07/15/2020 M86.171 Other acute osteomyelitis, right ankle and foot 09/03/2020 09/03/2020 Resolved Problems Electronic Signature(s) Signed: 03/30/2023 10:47:31 AM By: [name] MD FACS Entered By: [name] on 03/30/2023 07:47:31 [name] ([id number]) [id number] Page 9 of 17 -------------------------------------------------------------------------------- Progress Note Details Patient Name: Date of Service: [name], [name] 03/30/2023 9:30 A M Medical Record Number: [id number] Patient Account Number:  [id number] Date of Birth/Sex: Treating RN: 06-05-1976 (47 y.o. M) Primary Care Provider: Dorinda Hill Other Clinician: Referring Provider: Treating Provider/Extender: Jana Hakim in Treatment: 141 Subjective Chief Complaint Information obtained from Patient Bilateral Plantar Foot Ulcers History of Present Illness (HPI) Wounds are12/03/2020 upon evaluation today patient presents for initial inspection here in our clinic concerning issues he has been having with the bottoms of his feet bilaterally. He states these actually occurred as wounds when he was hospitalized for 5 months secondary to Covid. He was apparently with tilting bed where he was in an upright position quite frequently and apparently this occurred in some way shape or form during that time. Fortunately there is no sign of active infection at this time. No fevers, chills, nausea, vomiting, or diarrhea. With that being said he still has substantial wounds on the plantar aspects of his feet Theragen require quite a bit of work to get these to heal. He has been using Santyl currently though that is been problematic both in receiving the medication as well as actually paid for it as it is become quite expensive. Prior to the experience with Covid the patient really did not have any major medical problems other than hypertension he does have some mild generalized weakness following the Covid experience. 07/22/2020 on evaluation today patient appears to be doing okay in regard to his foot ulcers I feel like the wound beds are showing signs of better improvement that I do believe the Iodoflex is helping in this regard. With that being said he does have a lot of drainage currently and this is somewhat blue/green in nature which is consistent with Pseudomonas. I do think a culture today would be appropriate for [place] to evaluate and see if that is indeed the case I would likely start him on antibiotic orally as well he is  not allergic to Cipro knows of no issues he has had in the past 12/21; patient was admitted to the clinic earlier this month with bilateral presumed pressure ulcers on the bottom of his feet apparently related to excessive pressure from a tilt table arrangement in the intensive care unit. Patient relates this to being on ECMO but I am not really sure that is exactly related to that. I must say I have never seen anything like this. He has fairly extensive full-thickness wounds extending from his heel towards his midfoot mostly centered laterally. There is already been some healing distally. He does not appear to have an arterial issue. He has been using gentamicin to the wound surfaces with Iodoflex to help with ongoing debridement 1/6; this is a patient with pressure ulcers on the bottom of his feet related to excessive pressure from a standing position in the intensive care unit. He is complaining of a lot of pain in the right heel. He is not a diabetic. 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 We have been using Iodoflex to help prepare the surfaces of both wounds for an advanced treatment product. He is nonambulatory spending most of his time in a wheelchair I have asked him not to propel the wheelchair with his heels 1/13; in general his wounds look better not much surface area change we have been using Iodoflex as of last week. I did an x-ray of the right heel as the patient was complaining of pain. I had some thoughts about a stress fracture perhaps Achilles tendon problems however what it showed was erosive changes along the inferior aspect of the calcaneus he now has a MRI booked for 1/20. 1/20; in general his wounds continue to be better. Some improvement in the large narrow areas proximally in his foot. He is still complaining of pain in the right heel and tenderness in certain areas of this wound. His MRI is tonight. I am not just looking for osteomyelitis that was  brought up on the x-ray I am wondering about stress fractures, tendon ruptures etc. He has no such findings on the left. Also noteworthy is that the patient had critical illness neuropathy and some of the discomfort may be actual improvement in nerve function I am just not sure. These wounds were initially in the setting of severe critical illness related to COVID-19. He was put in a standing position. He may have also been on pressors at the point contributing to tissue ischemia. By his description at some point these wounds were grossly necrotic extending proximally up into the Achilles part of his heel. I do not know that I have ever really seen pictures of them like this although they may exist in epic We have ordered Tri layer Oasis. I am trying to stimulate some granulation in these areas. This is of course assuming the MRI is negative for infection 1/27; since the patient was last here he saw Dr. Earlene Plater of infectious disease. He is planned for vancomycin and ceftriaxone. Prior operative culture grew MSSA. Also ordered baseline lab work. He also ordered arterial studies although the ABIs in our clinic were normal as well as his clinical exam these were normal I do not think he needs to see vascular surgery. His ABIs at the [facility] were 1.22 in the right triphasic waveforms with a normal TBI of 1.15 on the left ABI of 1.22 with triphasic waveforms and a normal TBI of 1.08. Finally he saw Dr. Logan Bores who will follow him in for 2 months. At this point I do not think he felt that he needed a procedure on the right calcaneal bone. Dr. Earlene Plater is elected for broad-spectrum antibiotic The patient is still having pain in the right heel. He walks with a walker 2/3; wounds are generally smaller. He is tolerating his IV antibiotics. I believe this is vancomycin and ceftriaxone. We are still waiting for Oasis burn in terms of his out-of-pocket max which he should be meeting soon given the IV antibiotics, MRIs  etc. I have asked him to check in on this. We are using silver collagen in the meantime the wounds look better 2/10; tolerating IV vancomycin and Rocephin. We are waiting to apply for Oasis. Although I am not really sure where he is in his out-of-pocket max. 2/17 started the first application of Oasis trilayer. Still on antibiotics. The wounds have generally look better. The area on the left has a little more surface slough requiring debridement 2/24; second application of Oasis trilayer. The wound surface granulation is generally  look better. The area on the left with undermining laterally I think is come in a bit. 10/08/2020 upon evaluation today patient is here today for [employer] application #3. Fortunately he seems to be doing extremely well with regard to this and we are seeing a lot of new epithelial growth which is great news. Fortunately there is no signs of active infection at this time. 10/16/2020 upon evaluation today patient appears to be doing well with regard to his foot ulcers. Do believe the Oasis has been of benefit for him. I do not see any signs of infection right now which is great news and I think that he has a lot of new epithelial growth which is great to see as well. The patient is very pleased to hear all of this. I do think we can proceed with the Oasis trilayer #4 today. 3/18; not as much improvement in these areas on his heels that I was hoping. I did reapply trilateral Oasis today the tissue looks healthier but not as much fill NICHOALS, COLASUONNO ([id number]) [id number] Page 10 of 17 in as I was hoping. 3/25; better looking today I think this is come in a bit the tissue looks healthier. Triple layer Oasis reapplied #6 4/1; somewhat better looking definitely better looking surface not as much change in surface area as I was hoping. He may be spending more time [place] on days then he needs to although he does have heel offloading boots. Triple  layer Oasis reapplied #7 4/7; unfortunately apparently [facility] will not approve any further Oasis which is unfortunate since the patient did respond nicely both in terms of the condition of the wound bed as well as surface area. There is still some drainage coming from the wound but not a lot there does not appear to be any infection 4/15; we have been using Hydrofera Blue. He continues to have nice rims of epithelialization on the right greater than the left. The left the epithelialization is coming from the tip of his heel. There is moderate drainage. In this that concerns me about a total contact cast. There is no evidence of infection 4/29; patient has been using Hydrofera Blue with dressing changes. He has no complaints or issues today. 5/5; using Hydrofera Blue. I actually think that he looks marginally better than the last time I saw this 3 weeks ago. There are rims of epithelialization on the left thumb coming from the medial side on the right. Using Hydrofera Blue 5/12; using Hydrofera Blue. These continue to make improvements in surface area. His drainage was not listed as severe I therefore went ahead and put a cast on the left foot. Right foot we will continue to dress his previous 5/16; back for first total contact cast change. He did not tolerate this particularly well cast injury on the anterior tibia among other issues. Difficulty sleeping. I talked him about this in some detail and afterwards is elected to continue. I told him I would like to have a cast on for 3 weeks to see if this is going to help at all. I think he agreed 5/19; I think the wound is better. There is no tunneling towards his midfoot. The undermining medially also looks better. He has a rim of new skin distally. I think we are making progress here. The area on the left also continues to look somewhat better to me using Hydrofera Blue. He has a list of complaints about the cast but none of  them seem  serious 5/26; patient presents for 1 week follow-up. He has been using a total contact cast and tolerating this well. Hydrofera Blue is the main dressing used. He denies signs of infection. 6/2 Hydrofera Blue total contact cast on the left. These were large ulcers that formed in intensive care unit where the patient was recovering from COVID. May have had something to do with being ventilated in an upright positiono Pressors etc. We have been able to get the areas down considerably and a viable surface. There is some epithelialization in both sides. Note made of drainage 6/9; changed to [facility] last time because of drainage. He arrives with better looking surfaces and dimensions on the left than the right. Paradoxically the right actually probes more towards his midfoot the left is largely close down but both of these look improved. Using a total contact cast on the left 6/16; complex wounds on his bilateral plantar heels which were initially pressure injury from a stay in the ICU with COVID. We have been using silver alginate most recently. His dimensions of come in quite dramatically however not recently. We have been putting the left foot in a total contact cast 6/23; complex wounds on the bilateral plantar heels. I been putting the left in the cast paradoxically the area on the right is the one that is going towards closure at a faster rate. Quite a bit of drainage on the left. The patient went to see Dr. Logan Bores who said he was going to standby for skin grafts. I had actually considered sending him for skin grafts however he would be mandatorily off his feet for a period of weeks to months. I am thinking that the area on the right is going to close on its own the area on the left has been more stubborn even though we have him in a total contact cast 6/30; took him out of a total contact cast last week is the right heel seem to be making better progress than the left where I was placing the  cast. We are using silver alginate. Both wounds are smaller right greater than left 7/12; both wounds look as though they are making some progress. We are using silver alginate. Heel offloading boots 7/26; very gradual progress especially on the right. Using silver alginate. He is wearing heel offloading boots 8/18; he continues to close these wounds down very gradually. Using silver alginate. The problem polymen being definitive about this is areas of what appears to be callus around the margins. This is not a 100% of the area but certainly sizable especially on the right 9/1; bilateral plantar feet wounds secondary to prolonged pressure while being ventilated for COVID-19 in an upright position. Essentially pressure ulcers on the bottom of his feet. He is made substantial progress using silver alginate. 9/14; bilateral plantar feet wounds secondary to prolonged pressure. Making progress using silver alginate. 9/29 bilateral plantar feet wounds secondary to prolonged pressure. I changed him to Iodoflex last week. MolecuLight showing reddened blush fluorescence 10/11; patient presents for follow-up. He has no issues or complaints today. He denies signs of infection. He continues to use Iodoflex and antibiotic ointment to the wound beds. 10/27; 2-week follow-up. No evidence of infection. He has callus and thick dry skin around the wound margins we have been using Iodoflex and Bactroban which was in response to a moderate left MolecuLight reddish blush fluorescence. 11/10; 2-week follow-up. Wound margins again have thick callus however the measurements of the actual  wound sites are a lot smaller. Everything looks reasonably healthy here. We have been using Iodoflex He was approved for prime matrix but I have elected to delay this given the improvement in the surface area. Hopefully I will not regret that decision as were getting close to the end of the year in terms of insurance payment 12/8; 2-week  follow-up. Wounds are generally smaller in size. These were initially substantial wounds extending into the forefoot all the way into the heel on the bilateral plantar feet. They are now both located on the plantar heel distal aspect both of these have a lot of callus around the wounds I used a #5 curette to remove this on the right and the left also some subcutaneous debris to try and get the wound edges were using Iodoflex. He has heel offloading shoe 12/22; 2-week follow-up. Not really much improvement. He has thick callus around the outer edges of both wounds. I remove this there is some nonviable subcutaneous tissue as well. We have been using Iodoflex. Her intake nurse and myself spontaneously thought of a total contact cast I went back in May. At that time we really were not seeing much of an improvement with a cast although the wound was in a much different situation I would like to retry this in 2 weeks and I discussed this with the patient 08/12/2021; the patient has had some improvement with the Iodoflex. The the area on the left heel plantar more improved than the right. I had to put him in a total contact cast on the left although I decided to put that off for 2 weeks. I am going to change his primary dressing to silver collagen. I think in both areas he has had some improvement most of the healing seems to be more proximal in the heel. The wounds are in the mid aspect. A lot of thick callus on the right heel however. 1/19; we are using silver collagen on both plantar heel areas. He has had some improvement today. The left did not require any debridement. He still had some eschar on the right that was debrided but both seem to have contracted. I did not put it total contact cast on him today 2/2 we have been using silver collagen. The area on the right plantar heel has areas that appear to be epithelialized interspersed with dry flaking callus and dry skin. I removed this. This really looks  better than on the other side. On the left still a large area with raised edges and debris on the surface. 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 He has no issues or complaints today. 2/9; not much change in the left foot wound with 1 week of a cast we are using silver collagen. Silver collagen on the right side. The right side has been the better wound surface. We will reapply the total contact cast on the left 2/16; not much improvement on either side I been using silver collagen with a total contact cast on the left. I'm changing the Hydrofera Blue still with a total contact cast on the left 2/23; some improvement on both sides. Disappointing that he has thick callus around the area that we are putting in a total contact cast on the left. We've been using Hydrofera Blue on both wound areas. This is a man who at essentially pressure  ulcers in addition to ischemia caused by medications to support his blood pressure (pressors) in the ICU. He was being ventilated in the standing position for severe Covid. A Shiley the wounds extended across his entire foot but are now localized to his plantar heels bilaterally. We have made progress however neither areas healed. I continue to think the total contact cast is helped albeit painstakingly slowly. He has never wanted a plastic surgery consult although I don't know that they would be interested in grafting in area in this location. 10/07/2021: Continued improvement bilaterally. There is still some callus around the left wound, despite the total contact cast. He has some increased pain in his right midfoot around 1 particular area. This has been painful in the past but seems to be a little bit worse. When his cast was removed today, there was an area on the heel of the left foot that looks a bit macerated. He is also  complaining of pain in his left thigh and hip which he thinks is secondary to the limb length DAWIT, MCGUIRT ([id number]) [id number] Page 11 of 17 discrepancy caused by the cast. 10/14/2021: He continues to improve. A little bit less callus around the left wound. He continues to endorse pain in his right midfoot, but this is not as significant as it was last week. The maceration on his left heel is improved. 10/21/2021: Continued improvement to both wounds. The maceration on his left heel is no longer evident. Less callus bilaterally. Epithelialization progressing. 10/28/2021: Significant improvement this week. The right sided wound is nearly closed with just a small open area at the middle. No maceration seen on the left heel. Continued epithelialization on both sides. No concern for infection. 11/04/2021: T oday, the wounds were measured a little bit differently and come out as larger, but I actually think they are about the same to potentially even smaller, particularly on the left. He continues to accumulate some callus on the right. 11/11/2021: T oday, the patient is expressing some concern that the left wound, despite being in the total contact cast, is not progressing at the same rate as the 1 on the right. He is interested in trying a week without the cast to see how the wound does. The wounds are roughly the same size as last week, with the right perhaps being a little bit smaller. He continues to build up callus on both sites. 11/18/2021: Last week, I permitted the patient to go without his total contact cast, just to see if the cast was really making any difference. Today, both wounds have deteriorated to some extent, suggesting that the cast is providing benefit, at least on the left. Both are larger and have accumulated callus, slough, and other debris. 11/26/2021: I debrided both wounds quite aggressively last week in an effort to stimulate the healing cascade. This  appears to have been effective as the left sided wound is a full centimeter shorter in length. Although the right was measured slightly larger, on inspection, it looks as though an area of epithelialized tissue was included in the measurements. We have been using PolyMem Ag on the wound surfaces with a total contact cast to the left. 12/02/2021: It appears that the intake personnel are including epithelialized tissue in his wound measurements; the right wound is almost completely epithelialized; there is just a crater at the proximal midfoot with some open areas. On the left, he has built up some callus, but the overall wound surface looks good. There is  some senescent skin around the wound margin. He has been in PolyMem Ag bilaterally with a total contact cast on the left. 12/09/2021: The right wound is nearly closed; there is just a small open area at the mid calcaneus. On the left, the wound is smaller with minimal callus buildup. No significant drainage. 12/16/2021: The right calcaneal wound remains minimally open at the mid calcaneus; the rest has epithelialized. On the left, the wound is also a little bit smaller. There is some senescent tissue on the periphery. He is getting his first application of a trial skin substitute called Vendaje today. 12/23/2021: The wound on his right calcaneus is nearly closed; there is just a small area at the most distal aspect of the calcaneus that is open. On the left, the area where we applied to the skin substitute has a healthier-looking bed of granulation tissue. The wound dimensions are not significantly different on this side but the wound surface is improved. 12/30/2021: The wound on the right calcaneus has not changed significantly aside from some accumulation of callus. On the left, the open area is smaller and continues to have an improved surface. He continues to accumulate callus around the wound. He is here for his third application of Vendaje. 01/06/2022:  The right calcaneal wound is down to just a couple of millimeters. He continues to accumulate periwound callus. He unfortunately got his cast wet earlier in the week and his left foot is macerated, resulting in some superficial skin loss just distal to the open wound. The open wound itself, however, is much smaller and has a healthier appearing surface. He is here for his fourth application of Vendaje. 01/13/2022: The right calcaneal wound is about the same. Unfortunately, once again, his cast got wet and his foot is again macerated. This is caused the left calcaneal wound to enlarge. He is here for his fifth application of Vendaje. 01/20/2022: The right calcaneal wound is very small. There is some periwound callus accumulation. He purchased a new cast protector last week and this has been effective in avoiding the maceration that has been occurring on the left. The left calcaneal wound is narrower and has a healthy and viable-appearing surface. He is here for his 6 application of Vendaje. 01/27/2022: The right calcaneal wound is down to just a pinhole. There is some periwound slough and callus. On the left, the wound is narrower and shorter by about a centimeter. The surface is robust and viable-appearing. Unfortunately, the rep for the trial skin substitute product did not provide any for [place] to use today. 02/04/2022: The right calcaneal wound remains unchanged. There is more accumulated callus. On the left, although the intake nurse measured it a little bit longer, it looks about the same to me. The surface has a layer of slough, but underneath this, there is good granulation tissue. 02/10/2022: The right calcaneus wound is nearly closed. There is still some callus that builds up around the site. The left side looks about the same in terms of dimensions, but the surface is more robust and vital-appearing. 02/16/2022: The area of the right calcaneus that was nearly closed last week has closed, but there is a  small opening at the mid foot where it looks like some moisture got retained and caused some reopening. The left foot wound is narrower and shallower. Both sites have a fair amount of periwound callus and eschar. 02/24/2022: The small midfoot opening on the right calcaneus is a little bit smaller today. The left foot  wound is narrower and shallower. He continues to accumulate periwound callus. No concern for infection. 03/01/2022: The patient came to clinic early because he showered and got his cast wet. Fortunately, there is no significant maceration to his foot but the callus softened and it looks like the wound on his left calcaneus may be a little bit wider. The wound on his right calcaneus is just a narrow slit. Continued accumulation of periwound callus bilaterally. 03/08/2022: The wound on his right calcaneus is very nearly closed, just a small pinpoint opening under a bit of eschar; the left wound has come in quite a bit, as well. It is narrower and shorter than at our last visit. Still with accumulated callus and eschar bilaterally. 03/17/2022: The right calcaneal wound is healed. The left wound is smaller and the surface itself is very clean, but there is some blue-green staining on the periwound callus, concerning for Pseudomonas aeruginosa. 03/23/2022: The right calcaneal wound remains closed. The left wound continues to contract. No further blue-green staining. Small amount of callus and slough accumulation. 03/28/2022: He came in early today because he had gotten his cast wet. On inspection, the wound itself did not get wet or macerated, just a little bit of the forefoot. The wound itself is basically unchanged. 04/07/2022: The right foot wound remains closed. The left wound is the smallest that I have seen it to date. It is narrower and shorter. It still continues to accumulate slough on the surface. 04/15/2022: There is a band of epithelium now dividing the small left plantar foot wound in 2.  There is still some slough on the surface. 04/21/2022: The wound continues to narrow. Just a little bit of slough on the surface. He seems to be responding well to endoform. DAIMIEN, VANDERZEE ([id number]) [id number] Page 12 of 17 04/28/2022: Continued slow contraction of the wound. There is a little slough on the surface and some periwound callus. We have been using endoform and total contact cast. 05/05/2022: The wound appears to have stalled. There is slough and some periwound eschar/callus. No concern for infection, however. 05/12/2022: Unfortunately, his right foot has reopened. It is located at the most posterior aspect of his surgical incision. The area was noted to have drainage coming from it when his padding was removed today. Underneath some callus and senescent skin, there is an opening. No purulent drainage or malodor. On the left foot, the wound is again unchanged. There is some light blue staining on the callus, but no malodor or purulent drainage. 10/13; right and left heel remanence of extensive plantar foot wounds. These are better than I remember by quite a big margin however he is still left with wounds on the left plantar heel and the right plantar heel. Been using endoform bilaterally. A culture was done that showed apparently Pseudomonas but we are still waiting for the [facility] antibiotic to use gentamicin today. He is still very active by description I am not sure about the offloading of his noncasted right foot 10/20; both wounds right and left heel debrided not much change from last week. [name] Echevaria has arrived which is linezolid, gentamicin and ciprofloxacin we will use this with endoform. T contact cast on the left otal 06/02/2022: Both wounds are smaller today. There is still a fair amount of callus buildup around the right foot ulcer. The left is more superficial and nearly flush with the surrounding tissues. Also with slough and eschar  buildup. 06/10/2022: The right sided wound appears to be nearly closed,  if not completely so, although it is somewhat difficult to tell given the abnormal tissue and scarring in his foot. There is a fair amount of callus and crust accumulation. On the left, the wound looks about the same, again with callus and slough. He has an appointment next Thursday with Dr. Annamary Rummage in podiatry; I am hopeful that there may be some reconstructive options available for Mr. Loadholt. 06/16/2022: Both wounds have some eschar and callus accumulation. The right sided wound is extremely narrow and barely open; the left is narrower than last week. There is a little bit of slough. He has his appointment in podiatry later today. 06/23/2022: The patient met with Dr. Annamary Rummage last week and unfortunately, there are no reconstructive options that he believes would be helpful. He did order an MRI to evaluate for osteomyelitis and fortunately, none was seen. The left sided wound is a little bit shorter and narrower today. The right sided wound is about the same. There is callus and eschar accumulation bilaterally. 06/29/2022: Both feet have improved from last week. There is epithelium making a valiant effort to creep across the surface on the left. The right side looks like it got a little dry and the deep crevasse in his midfoot has cracked. Both have eschar and there is some slough on the left. 07/07/2022: Both feet have improved. There is epithelium completely covering the calcaneus on the right with just a small opening in the crevasse in his midfoot. On the left, the open area of tissue is smaller but he continues to build up callus/eschar and slough. 07/15/2022: The opening in the midfoot on the right is about the same size, covered with eschar and a little bit of slough. The open portion of the left wound is narrower and shorter with a bit of slough buildup. He admits to being on his feet more than recommended. 12/14; as  far as I can tell everything on the right foot is closed. There is some eschar I removed some of this I cannot identify any open wound here. As usual this will be a very vulnerable area going forward. On the left this looks really quite healthy. I was pleasantly surprised to see how good this looked. Wound is certainly smaller and there appears to be healthy epithelialization. He has been using Promogran on the right and endoform on the left. He has been offloading the right foot with a heel offloading boot and he has a running shoe on the right foot 08/04/2022: The right foot remains closed. He has a thick cushioned insole in his sneaker. The left sided wound is smaller with just some slough and eschar accumulation. He is wearing the heel off loader on this foot. 08/15/2022: The right foot remains closed. The left wound has narrowed further. There is some slough and eschar accumulation. 08/25/2022: We put him in a peg assist shoe insert and as a result, he has more epithelialization of the ulcer with minimal slough and eschar accumulation. 09/01/2022: The wound is smaller by about half this week. Still with some slough on the surface. The peg assist shoe seems to be doing a remarkable job of adequately offloading the site. 09/08/2022: There is a little bit more epithelium coming in. There is some slough and callus buildup. 09/16/2022: The wound measures about the same size, but the epithelium that has grown in looks more robust and stronger. There is some slough and callus buildup. 09/23/2022: The wound remains about the same size. The skin edges are  looking rather senescent. 09/29/2022: The aggressive debridement I performed last week seems to have been effective. The wound is smaller and has significantly less slough accumulation. 10/06/2022: There has been quite a bit of epithelialization since last week. There are still some open areas with slough accumulation. There is some callus buildup around the  perimeter. 10/13/2022: Continued improvement. Minimal callus accumulation. 3/14; patient presents for follow-up. He has been using endoform to the wound bed without issues. He is using a surgical shoe with peg assist for offloading. 10/27/2022: The wound dimensions are stable. There is some senescent skin accumulation around the perimeter. 11/04/2022: Last week I performed a very aggressive debridement in an effort to stimulate the healing cascade. As has been the case when I have done this before, the wound has responded well. There has been epithelialization and contraction of the wound. There are just a couple of small open areas. 11/10/2022: Unfortunately, his foot got wet secondary to sweating and there has been some breakdown of the tissue, particularly the skin just adjacent to the main ulcer. 11/18/2022: We continue to struggle with moisture-related tissue breakdown around the perimeter of his wound. This has caused the thin epithelium that had formed on the surface to disintegrate. The underlying surface of the wound has good granulation tissue. 11/25/2022: The edges of the wound are much less macerated, but the surface is actually looking a little bit dry. There is slough accumulation. No obvious signs of infection. 12/01/2022: The wound edges are more macerated and broken down, making the wound larger. The surface has some slough on it. 12/08/2020: The wound looks better this week. There is significantly less maceration and moisture-related tissue breakdown. There is some slough on the wound surface. KAYSTON, BURKART ([id number]) [id number] Page 13 of 17 12/15/2022: The wound continues to improve. There is almost no moisture-related tissue breakdown. There is epithelium beginning to fill in again from the edges. Light slough on the wound surface. 12/22/2022: More epithelium has filled him from around the edges. There is some slough on the surface. No evidence of  moisture-related tissue breakdown. 01/05/2023: There has been more epithelialization, particularly at the posterior calcaneal aspect of the wound. There has been a little bit of moisture accumulation with minimal maceration. 01/12/2023: More epithelium has filled in. There is very minimal accumulation of slough. 01/20/2023: The wound is measuring a little bit smaller today. It did measure deeper, but this appears to be secondary to some heaped up senescent skin around the edges. The epithelium that has been present at the posterior aspect of the wound seems to be a bit more robust with greater integrity. 01/26/2023: He reports intense itching to the skin around the wound and there has been some breakdown here. It looks fungal in nature. The wound itself looks pretty good with improved epithelialization with just some slough buildup. He has a little callus along the wound edges. 02/02/2023: He has been treating the periwound skin with an over-the-counter athlete's foot medication and notes significant improvement in his pruritus. The skin looks better here, as well. The wound continues to epithelialize. There is light slough accumulation. 02/10/2023: The periwound skin still looks fairly angry. He reports multiple small blisters that have opened up. He is not having the intense itching that he had prior, however. There has been more epithelialization at the calcaneus and there is minimal slough accumulation. 02/16/2023: The periwound skin is improving. An online review of dermatology rash images suggest that this may be hand-foot-and-mouth disease, isolated to the  feet; the patient says he actually had this as a child. The calcaneus continues to epithelialize and the tissue was quite robust here. Very minimal slough accumulation on the open portion of the wound with a little bit of periwound callus buildup. 03/03/2023: The wound measurements are about the same, but there is more epithelium making its way to the  surface. There is a little bit of crust and eschar around the edges and minimal slough on the surface. 03/09/2023: His dressing was applied rather haphazardly and the drawtex was not applied. The Optifoam was also reversed such that the orange side was in contact with his bare skin. He has some maceration around the wound edges, but this did not appear to result in any tissue breakdown. There is a little bit of slough on the surface and minimal eschar around the edges. 03/15/2023: The wound measured narrower today. There is some slough accumulation on the surface and some eschar around the edges. 03/23/2023: The length of the wound remains stable, but it continues to narrow. The skin that has covered the posterior aspect of the calcaneus is more robust and has better tensile integrity. Minimal slough and eschar accumulation. 03/30/2023: The wound length has decreased. This seems to be primarily due to more epithelium covering the posterior aspect of the site. Minimal slough and eschar buildup. Patient History Information obtained from Patient. Family History Cancer - Maternal Grandparents, Diabetes - Father,Paternal Grandparents, Heart Disease - Maternal Grandparents, Hypertension - Father,Paternal Grandparents, Lung Disease - Siblings, No family history of Hereditary Spherocytosis, Kidney Disease, Seizures, Stroke, Thyroid Problems, Tuberculosis. Social History Never smoker, Marital Status - Married, Alcohol Use - Never, Drug Use - No History, Caffeine Use - Daily - tea, soda. Medical History Eyes Denies history of Cataracts, Glaucoma, Optic Neuritis Ear/Nose/Mouth/Throat Denies history of Chronic sinus problems/congestion, Middle ear problems Hematologic/Lymphatic Denies history of Anemia, Hemophilia, Human Immunodeficiency Virus, Lymphedema, Sickle Cell Disease Respiratory Patient has history of Asthma Denies history of Aspiration, Chronic Obstructive Pulmonary Disease (COPD), Pneumothorax,  Sleep Apnea, Tuberculosis Cardiovascular Patient has history of Angina - with COVID, Hypertension Denies history of Arrhythmia, Congestive Heart Failure, Coronary Artery Disease, Deep Vein Thrombosis, Hypotension, Myocardial Infarction, Peripheral Arterial Disease, Peripheral Venous Disease, Phlebitis, Vasculitis Gastrointestinal Denies history of Cirrhosis , Colitis, Crohns, Hepatitis A, Hepatitis B, Hepatitis C Endocrine Denies history of Type I Diabetes, Type II Diabetes Genitourinary Denies history of End Stage Renal Disease Immunological Denies history of Lupus Erythematosus, Raynauds, Scleroderma Integumentary (Skin) Denies history of History of Burn Musculoskeletal Denies history of Gout, Rheumatoid Arthritis, Osteoarthritis, Osteomyelitis Neurologic Denies history of Dementia, Neuropathy, Quadriplegia, Paraplegia, Seizure Disorder Oncologic Denies history of Received Chemotherapy, Received Radiation Psychiatric Denies history of Anorexia/bulimia, Confinement Anxiety Hospitalization/Surgery History - COVID PNA 07/22/2019- 11/14/2019. - 03/27/2020 wound debridement/ skin graft. Medical A Surgical History Notes nd Constitutional Symptoms (General Health) COVID PNA 07/22/2019-11/14/2019 VENT ECMO, foot drop left foot , ASANTI, VILLA ([id number]) [id number] Page 14 of 17 kidney stone Psychiatric anxiety Objective Constitutional no acute distress. Vitals Time Taken: 9:34 AM, Height: 69 in, Weight: 280 lbs, BMI: 41.3, Temperature: 99.1 F, Pulse: 86 bpm, Respiratory Rate: 16 breaths/min, Blood Pressure: 134/92 mmHg. Respiratory Normal work of breathing on room air. General Notes: 03/30/2023: The wound length has decreased. This seems to be primarily due to more epithelium covering the posterior aspect of the site. Minimal slough and eschar buildup. Integumentary (Hair, Skin) Wound #2 status is Open. Original cause of wound was  Pressure Injury. The date acquired was:  10/07/2019. The wound has been in treatment 141 weeks. The wound is located on the Left Calcaneus. The wound measures 3.5cm length x 1.5cm width x 0.2cm depth; 4.123cm^2 area and 0.825cm^3 volume. There is Fat Layer (Subcutaneous Tissue) exposed. There is no undermining noted, however, there is tunneling at 3:00 with a maximum distance of 0.8cm. There is a medium amount of serosanguineous drainage noted. The wound margin is distinct with the outline attached to the wound base. There is large (67-100%) pink granulation within the wound bed. There is a small (1-33%) amount of necrotic tissue within the wound bed including Adherent Slough. The periwound skin appearance had no abnormalities noted for moisture. The periwound skin appearance exhibited: Scarring. The periwound skin appearance did not exhibit: Callus, Crepitus, Excoriation, Induration, Rash, Atrophie Blanche, Cyanosis, Ecchymosis, Hemosiderin Staining, Mottled, Pallor, Rubor, Erythema. Periwound temperature was noted as No Abnormality. The periwound has tenderness on palpation. Assessment Active Problems ICD-10 Non-pressure chronic ulcer of other part of left foot with fat layer exposed Procedures Wound #2 Pre-procedure diagnosis of Wound #2 is a Pressure Ulcer located on the Left Calcaneus . There was a Selective/Open Wound Non-Viable Tissue Debridement with a total area of 4.12 sq cm performed by [name], MD. With the following instrument(s): Curette to remove Non-Viable tissue/material. Material removed includes [facility] after achieving pain control using Lidocaine 5% topical ointment. No specimens were taken. A time out was conducted at 09:53, prior to the start of the procedure. A Minimum amount of bleeding was controlled with Pressure. The procedure was tolerated well. Post Debridement Measurements: 3.5cm length x 1.5cm width x 0.2cm depth; 0.825cm^3 volume. Post debridement Stage noted as  Category/Stage III. Character of Wound/Ulcer Post Debridement requires further debridement. Post procedure Diagnosis Wound #2: Same as Pre-Procedure General Notes: Scribed for Dr. Lady Gary by [name], RN. Plan Follow-up Appointments: Return Appointment in 1 week. - Dr. Lady Gary Room 3 Anesthetic: Wound #2 Left Calcaneus: (In clinic) Topical Lidocaine 4% applied to wound bed - In clinic Bathing/ Shower/ Hygiene: May shower and wash wound with soap and water. Edema Control - Lymphedema / SCD / Other: Avoid standing for long periods of time. Moisturize legs daily. - as needed Off-Loading: Other: - keep pressure off of the bottom of your feet. Elevate legs throughout the day. Use the Shoe with the PegAssist off-loading insole Additional Orders / Instructions: Follow Nutritious Diet - Try to get 70-100 grams of Protein a day+ ARIYAN, SOTELLO ([id number]) [phone] Page 15 of 17 WOUND #2: - Calcaneus Wound Laterality: Left Cleanser: Normal Saline (Generic) 1 x Per Day/30 Days Discharge Instructions: Cleanse the wound with Normal Saline prior to applying a clean dressing using gauze sponges, not tissue or cotton balls. Cleanser: Wound Cleanser (Generic) 1 x Per Day/30 Days Discharge Instructions: Cleanse the wound with wound cleanser prior to applying a clean dressing using gauze sponges, not tissue or cotton balls. Peri-Wound Care: Ketoconazole Cream 2% 1 x Per Day/30 Days Discharge Instructions: Apply Ketoconazole mixed with zinc to the periwound Peri-Wound Care: Zinc Oxide Ointment 30g tube 1 x Per Day/30 Days Discharge Instructions: Apply Zinc Oxide to periwound with each dressing change Topical: Gentamicin (Generic) 1 x Per Day/30 Days Discharge Instructions: apply to the wound bed Prim Dressing: Maxorb Extra Ag+ Alginate Dressing, 4x4.75 (in/in) (Generic) 1 x Per Day/30 Days ary Discharge Instructions: Apply to wound bed as instructed Secondary  Dressing: Drawtex 4x4 in (Generic) 1 x Per Day/30 Days Discharge Instructions: Apply over primary dressing as directed. Secondary Dressing:  Optifoam Non-Adhesive Dressing, 4x4 in (Generic) 1 x Per Day/30 Days Discharge Instructions: Apply over primary dressing as directed. Secondary Dressing: Woven Gauze Sponge, Non-Sterile 4x4 in (Generic) 1 x Per Day/30 Days Discharge Instructions: Apply over primary dressing as directed. Secured With: 37M Medipore H Soft Cloth Surgical T ape, 4 x 10 (in/yd) (Generic) 1 x Per Day/30 Days Discharge Instructions: Secure with tape as directed. Com pression Wrap: Kerlix Roll 4.5x3.1 (in/yd) (Generic) 1 x Per Day/30 Days Discharge Instructions: Apply Kerlix and Coban compression as directed. Add-Ons: [employer] Offloading Boot, Size Regular 1 x Per Day/30 Days 03/30/2023: The wound length has decreased. This seems to be primarily due to more epithelium covering the posterior aspect of the site. Minimal slough and eschar buildup. I used a curette to debride slough and eschar from the wound. We will continue topical gentamicin, silver alginate, and drawtex. Continue ketoconazole and zinc oxide to the periwound. He continues to wear a heel offloading shoe. Follow-up in 1 week. Electronic Signature(s) Signed: 03/30/2023 10:51:38 AM By: [name] MD FACS Entered By: [name] on 03/30/2023 07:51:38 -------------------------------------------------------------------------------- HxROS Details Patient Name: Date of Service: [name], [name] 03/30/2023 9:30 A M Medical Record Number: [id number] Patient Account Number: [id number] Date of Birth/Sex: Treating RN: 02-09-1976 (47 y.o. M) Primary Care Provider: Dorinda Hill Other Clinician: Referring Provider: Treating Provider/Extender: Jana Hakim in Treatment: 141 Information Obtained From Patient Constitutional Symptoms (General Health) Medical History: Past Medical History  Notes: COVID PNA 07/22/2019-11/14/2019 VENT ECMO, foot drop left foot , Eyes Medical History: Negative for: Cataracts; Glaucoma; Optic Neuritis Ear/Nose/Mouth/Throat Medical History: Negative for: Chronic sinus problems/congestion; Middle ear problems Hematologic/Lymphatic Medical History: Negative for: Anemia; Hemophilia; Human Immunodeficiency Virus; Lymphedema; Sickle Cell Disease IZAI, NADEN ([id number]) [id number] Page 16 of 17 Respiratory Medical History: Positive for: Asthma Negative for: Aspiration; Chronic Obstructive Pulmonary Disease (COPD); Pneumothorax; Sleep Apnea; Tuberculosis Cardiovascular Medical History: Positive for: Angina - with COVID; Hypertension Negative for: Arrhythmia; Congestive Heart Failure; Coronary Artery Disease; Deep Vein Thrombosis; Hypotension; Myocardial Infarction; Peripheral Arterial Disease; Peripheral Venous Disease; Phlebitis; Vasculitis Gastrointestinal Medical History: Negative for: Cirrhosis ; Colitis; Crohns; Hepatitis A; Hepatitis B; Hepatitis C Endocrine Medical History: Negative for: Type I Diabetes; Type II Diabetes Genitourinary Medical History: Negative for: End Stage Renal Disease Past Medical History Notes: kidney stone Immunological Medical History: Negative for: Lupus Erythematosus; Raynauds; Scleroderma Integumentary (Skin) Medical History: Negative for: History of Burn Musculoskeletal Medical History: Negative for: Gout; Rheumatoid Arthritis; Osteoarthritis; Osteomyelitis Neurologic Medical History: Negative for: Dementia; Neuropathy; Quadriplegia; Paraplegia; Seizure Disorder Oncologic Medical History: Negative for: Received Chemotherapy; Received Radiation Psychiatric Medical History: Negative for: Anorexia/bulimia; Confinement Anxiety Past Medical History Notes: anxiety Immunizations Pneumococcal Vaccine: Received Pneumococcal Vaccination: No Implantable  Devices None Hospitalization / Surgery History Type of Hospitalization/Surgery COVID PNA 07/22/2019- 11/14/2019 03/27/2020 wound debridement/ skin graft Family and Social History Cancer: Yes - Maternal Grandparents; Diabetes: Yes - Father,Paternal Grandparents; Heart Disease: Yes - Maternal Grandparents; Hereditary Spherocytosis: No; Hypertension: Yes - Father,Paternal Grandparents; Kidney Disease: No; Lung Disease: Yes - Siblings; Seizures: No; Stroke: No; Thyroid Problems: No; Tuberculosis: No; Never smoker; Marital Status - Married; Alcohol Use: Never; Drug Use: No History; Caffeine Use: Daily - tea, soda; Financial Concerns: YOSSEF, RETANA ([id number]) [id number] Page 17 of 17 No; Food, Clothing or Shelter Needs: No; Support System Lacking: No; Transportation Concerns: No Psychologist, prison and probation services) Signed: 03/30/2023 12:59:10 PM By: [name] MD FACS Entered By: [name] on 03/30/2023 07:49:34 -------------------------------------------------------------------------------- SuperBill Details Patient  Name: Date of Service: [name] 03/30/2023 Medical Record Number: [id number] Patient Account Number: [id number] Date of Birth/Sex: Treating RN: 09/18/1975 (46 y.o. M) Primary Care Provider: Dorinda Hill Other Clinician: Referring Provider: Treating Provider/Extender: Jana Hakim in Treatment: 141 Diagnosis Coding ICD-10 Codes Code Description [phone] Non-pressure chronic ulcer of other part of left foot with fat layer exposed Facility Procedures : CPT4 Code: [id number] Description: 97597 - DEBRIDE WOUND 1ST 20 SQ CM OR < ICD-10 Diagnosis Description L97.522 Non-pressure chronic ulcer of other part of left foot with fat layer exposed Modifier: Quantity: 1 Physician Procedures : CPT4 Code Description Modifier [id number] - WC PHYS LEVEL 4 - EST PT 25 ICD-10 Diagnosis Description L97.522 Non-pressure chronic ulcer  of other part of left foot with fat layer exposed Quantity: 1 : [id number] 97597 - WC PHYS DEBR WO ANESTH 20 SQ CM ICD-10 Diagnosis Description L97.522 Non-pressure chronic ulcer of other part of left foot with fat layer exposed Quantity: 1 Electronic Signature(s) Signed: 03/30/2023 10:51:52 AM By: [name] MD FACS Entered By: [name] on 03/30/2023 07:51:51

## 2023-03-30 NOTE — Progress Notes (Signed)
KELLEY, LEAMING ([id number]) [id number] Page 1 of 8 Visit Report for 03/30/2023 Arrival Information Details Patient Name: Date of Service: [name], [name] [name] 03/30/2023 9:30 A M Medical Record Number: [id number] Patient Account Number: [id number] Date of Birth/Sex: Treating RN: 11/02/1975 (48 y.o. [name] Primary Care Asael Pann: Dorinda Hill Other Clinician: Referring Nassir Neidert: Treating Ahja Martello/Extender: Jana Hakim in Treatment: 141 Visit Information History Since Last Visit Added or deleted any medications: No Patient Arrived: Wheel Chair Any new allergies or adverse reactions: No Arrival Time: 09:34 Had a fall or experienced change in No Accompanied By: wife activities of daily living that may affect Transfer Assistance: None risk of falls: Patient Identification Verified: Yes Signs or symptoms of abuse/neglect since last visito No Secondary Verification Process Completed: Yes Hospitalized since last visit: No Patient Requires Transmission-Based Precautions: No Implantable device outside of the clinic excluding No Patient Has Alerts: No cellular tissue based products placed in the center since last visit: Has Dressing in Place as Prescribed: Yes Pain Present Now: No Electronic Signature(s) Signed: 03/30/2023 9:47:44 AM By: [name] RN Entered By: [name] on 03/30/2023 06:47:44 -------------------------------------------------------------------------------- Encounter Discharge Information Details Patient Name: Date of Service: [name], [name] [name] 03/30/2023 9:30 A M Medical Record Number: [id number] Patient Account Number: [id number] Date of Birth/Sex: Treating RN: Feb 02, 1976 (47 y.o. [name] Primary Care Wilhelmena Zea: Dorinda Hill Other Clinician: Referring Wyn Nettle: Treating Verlie Hellenbrand/Extender: Jana Hakim in Treatment: [phone] Encounter Discharge Information Items Post Procedure  Vitals Discharge Condition: Stable Temperature (F): 99.1 Ambulatory Status: Wheelchair Pulse (bpm): 86 Discharge Destination: Home Respiratory Rate (breaths/min): 16 Transportation: Private Auto Blood Pressure (mmHg): 134/92 Accompanied By: spouse Schedule Follow-up Appointment: Yes Clinical Summary of Care: Electronic Signature(s) Signed: 03/30/2023 11:04:12 AM By: [name] RN Entered By: [name] on 03/30/2023 08:04:12 [name] ([id number]) [id number] Page 2 of 8 -------------------------------------------------------------------------------- Lower Extremity Assessment Details Patient Name: Date of Service: [name] 03/30/2023 9:30 A M Medical Record Number: [id number] Patient Account Number: [id number] Date of Birth/Sex: Treating RN: 1976/07/27 (47 y.o. [name] Primary Care Chong Wojdyla: Dorinda Hill Other Clinician: Referring Harvin Konicek: Treating Samul Mcinroy/Extender: Jana Hakim in Treatment: 141 Edema Assessment Assessed: [name]: No] Franne Forts: No] Edema: [Left: N] [Right: o] Calf Left: Right: Point of Measurement: 29 cm From Medial Instep 43.5 cm Ankle Left: Right: Point of Measurement: 9 cm From Medial Instep 24.4 cm Vascular Assessment Extremity colors, hair growth, and conditions: Extremity Color: [Left:Normal] Hair Growth on Extremity: [Left:Yes] Temperature of Extremity: [Left:Warm] Capillary Refill: [Left:< 3 seconds] Dependent Rubor: [Left:No No] Electronic Signature(s) Signed: 03/30/2023 9:48:27 AM By: [name] RN Entered By: [name] on 03/30/2023 06:48:27 -------------------------------------------------------------------------------- Multi Wound Chart Details Patient Name: Date of Service: [name], [name] [place] E. 03/30/2023 9:30 A M Medical Record Number: [id number] Patient Account Number: [id number] Date of Birth/Sex: Treating RN: 11-12-1975 (47 y.o. M) Primary Care Dhani Dannemiller:  Dorinda Hill Other Clinician: Referring Maressa Apollo: Treating Ayeza Therriault/Extender: Jana Hakim in Treatment: 141 Vital Signs Height(in): 69 Pulse(bpm): 86 Weight(lbs): 280 Blood Pressure(mmHg): 134/92 Body Mass Index(BMI): 41.3 Temperature(F): 99.1 Respiratory Rate(breaths/min): 16 [2:Photos:] [N/A:N/A] Left Calcaneus N/A N/A Wound Location: Pressure Injury N/A N/A Wounding Event: Pressure Ulcer N/A N/A Primary Etiology: Asthma, Angina, Hypertension N/A N/A Comorbid History: 10/07/2019 N/A N/A Date Acquired: 141 N/A N/A Weeks of Treatment: Open N/A N/A Wound Status: No N/A N/A Wound Recurrence: 3.5x1.5x0.2 N/A N/A Measurements L x W x D (cm)  4.123 N/A N/A A (cm) : rea 0.825 N/A N/A Volume (cm) : 84.80% N/A N/A % Reduction in A rea: 97.00% N/A N/A % Reduction in Volume: 3 Position 1 (o'clock): 0.8 Maximum Distance 1 (cm): Yes N/A N/A Tunneling: Category/Stage III N/A N/A Classification: Medium N/A N/A Exudate A mount: Serosanguineous N/A N/A Exudate Type: red, brown N/A N/A Exudate Color: Distinct, outline attached N/A N/A Wound Margin: Large (67-100%) N/A N/A Granulation A mount: Pink N/A N/A Granulation Quality: Small (1-33%) N/A N/A Necrotic A mount: Fat Layer (Subcutaneous Tissue): Yes N/A N/A Exposed Structures: Fascia: No Tendon: No Muscle: No Joint: No Bone: No Small (1-33%) N/A N/A Epithelialization: Debridement - Selective/Open Wound N/A N/A Debridement: Pre-procedure Verification/Time Out 09:53 N/A N/A Taken: Lidocaine 5% topical ointment N/A N/A Pain Control: Slough N/A N/A Tissue Debrided: Non-Viable Tissue N/A N/A Level: 4.12 N/A N/A Debridement A (sq cm): rea Curette N/A N/A Instrument: Minimum N/A N/A Bleeding: Pressure N/A N/A Hemostasis A chieved: Procedure was tolerated well N/A N/A Debridement Treatment Response: 3.5x1.5x0.2 N/A N/A Post Debridement Measurements L x W x D (cm) 0.825 N/A  N/A Post Debridement Volume: (cm) Category/Stage III N/A N/A Post Debridement Stage: Scarring: Yes N/A N/A Periwound Skin Texture: Excoriation: No Induration: No Callus: No Crepitus: No Rash: No Dry/Scaly: Yes N/A N/A Periwound Skin Moisture: Maceration: No Atrophie Blanche: No N/A N/A Periwound Skin Color: Cyanosis: No Ecchymosis: No Erythema: No Hemosiderin Staining: No Mottled: No Pallor: No Rubor: No No Abnormality N/A N/A Temperature: Yes N/A N/A Tenderness on Palpation: Debridement N/A N/A Procedures Performed: Treatment Notes Electronic Signature(s) Signed: 03/30/2023 10:47:39 AM By: [name] MD FACS Entered By: [name] on 03/30/2023 07:47:39 [name] ([id number]) [id number] Page 4 of 8 -------------------------------------------------------------------------------- Multi-Disciplinary Care Plan Details Patient Name: Date of Service: [name] 03/30/2023 9:30 A M Medical Record Number: [id number] Patient Account Number: [id number] Date of Birth/Sex: Treating RN: 1975-12-19 (47 y.o. [name] Primary Care Lyvia Mondesir: Dorinda Hill Other Clinician: Referring Shada Nienaber: Treating Kaari Zeigler/Extender: Jana Hakim in Treatment: 141 Multidisciplinary Care Plan reviewed with physician Active Inactive Wound/Skin Impairment Nursing Diagnoses: Impaired tissue integrity Knowledge deficit related [name] ulceration/compromised skin integrity Goals: Patient/caregiver will verbalize understanding of skin care regimen Date Initiated: 07/15/2020 Target Resolution Date: 05/08/2023 Goal Status: Active Ulcer/skin breakdown will have a volume reduction of 30% by week 4 Date Initiated: 07/15/2020 Date Inactivated: 08/20/2020 Target Resolution Date: 09/03/2020 Goal Status: Unmet Unmet Reason: no major changes. Ulcer/skin breakdown will heal within 14 weeks Date Initiated: 12/04/2020 Date Inactivated:  12/10/2020 Target Resolution Date: 12/10/2020 Unmet Reason: wounds still open at 14 Goal Status: Unmet weeks and today 21 weeks. Interventions: Assess patient/caregiver ability [name] obtain necessary supplies Assess patient/caregiver ability [name] perform ulcer/skin care regimen upon admission and as needed Assess ulceration(s) every visit Provide education on ulcer and skin care Treatment Activities: Skin care regimen initiated : 07/15/2020 Topical wound management initiated : 07/15/2020 Notes: Electronic Signature(s) Signed: 03/30/2023 9:49:06 AM By: [name] RN Entered By: [name] on 03/30/2023 06:49:05 -------------------------------------------------------------------------------- Pain Assessment Details Patient Name: Date of Service: [name], [name] [name] 03/30/2023 9:30 A M Medical Record Number: [id number] Patient Account Number: [id number] Date of Birth/Sex: Treating RN: 09-May-1976 (47 y.o. [name] Primary Care Blane Worthington: Dorinda Hill Other Clinician: Referring Yousof Alderman: Treating Eaven Schwager/Extender: Jana Hakim in Treatment: 141 Active Problems Location of Pain Severity and Description of Pain Patient Has Paino No [name], [name] ([id number]) [phone].pdf Page 5 of  8 Patient Has Paino No Site Locations Rate the pain. Worst Pain Level: 8 Character of Pain Describe the Pain: Aching, Sharp Pain Management and Medication Current Pain Management: Electronic Signature(s) Signed: 03/30/2023 1:05:02 PM By: [name] RN Entered By: [name] on 03/30/2023 06:48:18 -------------------------------------------------------------------------------- Patient/Caregiver Education Details Patient Name: Date of Service: [name] 8/22/2024andnbsp9:30 A M Medical Record Number: [id number] Patient Account Number: [id number] Date of Birth/Gender: Treating RN: 03-21-1976 (47 y.o. [name] Primary Care Physician:  Dorinda Hill Other Clinician: Referring Physician: Treating Physician/Extender: Jana Hakim in Treatment: 141 Education Assessment Education Provided [name]: Patient Education Topics Provided Wound Debridement: Methods: Explain/Verbal Responses: Reinforcements needed, State content correctly Wound/Skin Impairment: Methods: Explain/Verbal Responses: Reinforcements needed, State content correctly Electronic Signature(s) Signed: 03/30/2023 1:05:02 PM By: [name] RN Entered By: [name] on 03/30/2023 08:02:53 [name] ([id number]) [id number] Page 6 of 8 -------------------------------------------------------------------------------- Wound Assessment Details Patient Name: Date of Service: [name] 03/30/2023 9:30 A M Medical Record Number: [id number] Patient Account Number: [id number] Date of Birth/Sex: Treating RN: 06-Feb-1976 (47 y.o. [name] Primary Care [name] Gores: Dorinda Hill Other Clinician: Referring Selam Pietsch: Treating Shaquon Gropp/Extender: Jana Hakim in Treatment: 141 Wound Status Wound Number: 2 Primary Etiology: Pressure Ulcer Wound Location: Left Calcaneus Wound Status: Open Wounding Event: Pressure Injury Comorbid History: Asthma, Angina, Hypertension Date Acquired: 10/07/2019 Weeks Of Treatment: 141 Clustered Wound: No Photos Wound Measurements Length: (cm) 3 Width: (cm) 1 Depth: (cm) 0 Area: (cm) Volume: (cm) .5 % Reduction in Area: 84.8% .5 % Reduction in Volume: 97% .2 Epithelialization: Small (1-33%) 4.123 Tunneling: Yes 0.825 Position (o'clock): 3 Maximum Distance: (cm) 0.8 Undermining: No Wound Description Classification: Category/Stage III Wound Margin: Distinct, outline attached Exudate Amount: Medium Exudate Type: Serosanguineous Exudate Color: red, brown Foul Odor After Cleansing: No Slough/Fibrino No Wound Bed Granulation Amount: Large  (67-100%) Exposed Structure Granulation Quality: Pink Fascia Exposed: No Necrotic Amount: Small (1-33%) Fat Layer (Subcutaneous Tissue) Exposed: Yes Necrotic Quality: Adherent Slough Tendon Exposed: No Muscle Exposed: No Joint Exposed: No Bone Exposed: No Periwound Skin Texture Texture Color No Abnormalities Noted: No No Abnormalities Noted: No Callus: No Atrophie Blanche: No Crepitus: No Cyanosis: No Excoriation: No Ecchymosis: No Induration: No Erythema: No Rash: No Hemosiderin Staining: No Scarring: Yes Mottled: No Pallor: No Moisture Rubor: No No Abnormalities NotedDEFOREST, [name] ([id number]) [id number] Page 7 of 8 Temperature / Pain Temperature: No Abnormality Tenderness on Palpation: Yes Treatment Notes Wound #2 (Calcaneus) Wound Laterality: Left Cleanser Normal Saline Discharge Instruction: Cleanse the wound with Normal Saline prior [name] applying a clean dressing using gauze sponges, not tissue or cotton balls. Wound Cleanser Discharge Instruction: Cleanse the wound with wound cleanser prior [name] applying a clean dressing using gauze sponges, not tissue or cotton balls. Peri-Wound Care Ketoconazole Cream 2% Discharge Instruction: Apply Ketoconazole mixed with zinc [name] the periwound Zinc Oxide Ointment 30g tube Discharge Instruction: Apply Zinc Oxide [name] periwound with each dressing change Topical Gentamicin Discharge Instruction: apply [name] the wound bed Primary Dressing Maxorb Extra Ag+ Alginate Dressing, 4x4.75 (in/in) Discharge Instruction: Apply [name] wound bed as instructed Secondary Dressing Drawtex 4x4 in Discharge Instruction: Apply over primary dressing as directed. Optifoam Non-Adhesive Dressing, 4x4 in Discharge Instruction: Apply over primary dressing as directed. Woven Gauze Sponge, Non-Sterile 4x4 in Discharge Instruction: Apply over primary dressing as directed. Secured With 61M Medipore H Soft Cloth Surgical T  ape, 4 x 10 (in/yd) Discharge Instruction: Secure  with tape as directed. Compression Wrap Kerlix Roll 4.5x3.1 (in/yd) Discharge Instruction: Apply Kerlix and Coban compression as directed. Compression Stockings Add-Ons [place] V[facility] Offloading Boot, Size Regular Electronic Signature(s) Signed: 03/30/2023 1:05:02 PM By: [name] RN Entered By: [name] on 03/30/2023 06:44:41 -------------------------------------------------------------------------------- Vitals Details Patient Name: Date of Service: [name], [name] [name] 03/30/2023 9:30 A M Medical Record Number: [id number] Patient Account Number: [id number] Date of Birth/Sex: Treating RN: 10/01/1975 (47 y.o. [name] Primary Care [name]: Dorinda Hill Other Clinician: Referring Loralie Malta: Treating [name] Lookingbill/Extender: Jana Hakim in Treatment: 141 Vital Signs Time Taken: 09:34 Temperature (F): 99.1 [name], [name] ([id number]) [id number] Page 8 of 8 Height (in): 69 Pulse (bpm): 86 Weight (lbs): 280 Respiratory Rate (breaths/min): 16 Body Mass Index (BMI): 41.3 Blood Pressure (mmHg): 134/92 Reference Range: 80 - 120 mg / dl Electronic Signature(s) Signed: 03/30/2023 9:48:04 AM By: [name] RN Entered By: [name] on 03/30/2023 06:48:04

## 2023-04-02 NOTE — Therapy (Signed)
OUTPATIENT PHYSICAL THERAPY THORACOLUMBAR EVALUATION   Patient Name: [name] MRN: [id number] DOB:10/28/1975, 47 y.o., male Today's Date: 04/04/2023  END OF SESSION:  PT End of Session - 04/03/23 1630     Visit Number 1    Number of Visits 4    Date for PT Re-Evaluation 05/29/23    Authorization Type HTA    PT Start Time 1625    PT Stop Time 1717    PT Time Calculation (min) 52 min    Activity Tolerance Patient tolerated treatment well    Behavior During Therapy WFL for tasks assessed/performed             Past Medical History:  Diagnosis Date   Anginal pain (HCC)    with covid   Anxiety    Asthma    Depression    Dyspnea    GERD (gastroesophageal reflux disease)    Headache    History of kidney stones    LEFT URETERAL STONE   HTN (hypertension)    Pancreatitis 2018   GALLBALDDER SLUDGE CAUSED ISSUED RESOLVED   Pneumonia 07/2019   covid   Prediabetes    SOBOE (shortness of breath on exertion)    Past Surgical History:  Procedure Laterality Date   CANNULATION FOR ECMO (EXTRACORPOREAL MEMBRANE OXYGENATION) N/A 08/28/2019   Procedure: CANNULATION FOR VV ECMO (EXTRACORPOREAL MEMBRANE OXYGENATION);  Surgeon: Donata Clay, Theron Arista, MD;  Location[facility] OR;  Service: Open Heart Surgery;  Laterality: N/A;  CRESCENT CANNULA   CANNULATION FOR ECMO (EXTRACORPOREAL MEMBRANE OXYGENATION) N/A 09/10/2019   Procedure: CANNULATION FOR ECMO (EXTRACORPOREAL MEMBRANE OXYGENATION) PUTTING IN CRESCENT 32FR CANNULA  AND REMOVING GROING CANNULATION;  Surgeon: Linden Dolin, MD;  Location: [facility] OR;  Service: Open Heart Surgery;  Laterality: N/A;  PUTTING IN CRESCENT/REMOVING GROIN CANNULATION   CYSTOSCOPY/URETEROSCOPY/HOLMIUM LASER/STENT PLACEMENT Left 04/18/2019   Procedure: LEFT URETEROSCOPY/HOLMIUM LASER/STENT PLACEMENT;  Surgeon: Crist Fat, MD;  Location: [facility];  Service: Urology;  Laterality: Left;   CYSTOSCOPY/URETEROSCOPY/HOLMIUM LASER/STENT PLACEMENT  Left 05/02/2019   Procedure: CYSTOSCOPY/URETEROSCOPY/HOLMIUM LASER/STENT EXCHANGE;  Surgeon: Crist Fat, MD;  Location: [facility] ORS;  Service: Urology;  Laterality: Left;   ECMO CANNULATION N/A 08/03/2019   Procedure: ECMO CANNULATION;  Surgeon: Linden Dolin, MD;  Location: [facility];  Service: Cardiovascular;  Laterality: N/A;   ESOPHAGOGASTRODUODENOSCOPY N/A 09/11/2019   Procedure: ESOPHAGOGASTRODUODENOSCOPY (EGD);  Surgeon: Linden Dolin, MD;  Location: [name] OR;  Service: Thoracic;  Laterality: N/A;   GRAFT APPLICATION Bilateral 03/27/2020   Procedure: APPLICATION OF SKIN GRAFT BILATERAL FEET;  Surgeon: Felecia Shelling, DPM;  Location: [facility];  Service: Podiatry;  Laterality: Bilateral;   IR REPLC GASTRO/COLONIC TUBE PERCUT W/FLUORO  10/14/2019   IRRIGATION AND DEBRIDEMENT SHOULDER Right 09/29/2017   Procedure: IRRIGATION AND DEBRIDEMENT SHOULDER;  Surgeon: Tarry Kos, MD;  Location: [facility] OR;  Service: Orthopedics;  Laterality: Right;   LUMBAR DISC SURGERY  2002   NASAL ENDOSCOPY WITH EPISTAXIS CONTROL N/A 08/31/2019   Procedure: NASAL ENDOSCOPY WITH EPISTAXIS CONTROL WITH CAUTERIZATION;  Surgeon: Christia Reading, MD;  Location: [facility] OR;  Service: ENT;  Laterality: N/A;   PORTACATH PLACEMENT N/A 09/11/2019   Procedure: PEG TUBE INSERTION - BEDSIDE;  Surgeon: Linden Dolin, MD;  Location: [facility] OR;  Service: Thoracic;  Laterality: N/A;   TEE WITHOUT CARDIOVERSION N/A 08/28/2019   Procedure: TRANSESOPHAGEAL ECHOCARDIOGRAM (TEE);  Surgeon: Donata Clay, Theron Arista, MD;  Location: [facility] OR;  Service: Open Heart Surgery;  Laterality: N/A;  TEE WITHOUT CARDIOVERSION N/A 09/10/2019   Procedure: TRANSESOPHAGEAL ECHOCARDIOGRAM (TEE);  Surgeon: Linden Dolin, MD;  Location: [facility] OR;  Service: Open Heart Surgery;  Laterality: N/A;   WOUND DEBRIDEMENT Bilateral 03/27/2020   Procedure: EXCISIONAL DEBRIDEMENT OF ULCERS BILATERAL FEET;  Surgeon: Felecia Shelling, DPM;  Location: [facility];  Service: Podiatry;   Laterality: Bilateral;   Patient Active Problem List   Diagnosis Date Noted   Type 2 diabetes mellitus without complication, without long-term current use of insulin (HCC) 10/18/2022   Vitamin D deficiency 10/18/2022   Other fatigue 09/07/2022   SOBOE (shortness of breath on exertion) 09/07/2022   Nocturnal hypoxemia 09/07/2022   Mood disorder (HCC) 09/07/2022   Ulcer of left foot (HCC) 09/07/2022   Depression 08/23/2022   Prediabetes 08/23/2022   Obstructive sleep apnea 01/11/2022   Insomnia due to other mental disorder 12/06/2021   Left foot drop 09/06/2021   Wheelchair dependence 06/04/2021   Encounter for therapeutic drug monitoring 09/29/2020   COVID-19 long hauler manifesting chronic dyspnea 09/15/2020   Morbid obesity (HCC) with starting BMI 46 09/15/2020   Encounter for attention to tracheostomy (HCC) 09/15/2020   Critical illness polyneuropathy (HCC) 09/15/2020   Moderate protein-calorie malnutrition (HCC) 09/15/2020   Pyogenic inflammation of bone (HCC) 09/09/2020   Long COVID 09/09/2020   Acute osteomyelitis (HCC) 08/31/2020   MSSA (methicillin susceptible Staphylococcus aureus) infection 08/31/2020   Reactive depression 07/01/2020   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   Impaired sensation to light touch 04/15/2020   Diffuse alveolar damage (HCC) 03/09/2020   Eschar of foot    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   History of COVID-19    Pressure injury of skin 08/22/2019   Need for emotional support    Chest tube in place    Personal history of ECMO    Advanced care planning/counseling discussion    Palliative care by specialist    Goals of care, counseling/discussion    Advanced directives, counseling/discussion    Subcutaneous crepitus    Thrombocytopenia (HCC)    Leukopenia    Fever    Gram positive bacterial infection    Septic arthritis of right acromioclavicular joint (HCC) 09/29/2017   Chronic left-sided low back  pain with left-sided sciatica 09/19/2017   Epigastric pain    Primary hypertension 04/17/2017   Moderate persistent asthma 07/21/2015   Allergic rhinoconjunctivitis 07/21/2015   GERD (gastroesophageal reflux disease) 07/21/2015   Abnormal gait 11/12/2009   TARSAL TUNNEL SYNDROME, LEFT 10/08/2009   PES PLANUS 10/08/2009     REFERRING PROVIDER: Berna Bue, MD   REFERRING DIAG: E66.01 (ICD-10-CM) - Morbid obesity due to excess calories   Rationale for Evaluation and Treatment: Rehabilitation  THERAPY DIAG:  Muscle weakness (generalized)  Stiffness of right knee, not elsewhere classified  Stiffness of left knee, not elsewhere classified  Difficulty in walking, not elsewhere classified  ONSET DATE: PT order 02/03/2023  SUBJECTIVE:  SUBJECTIVE STATEMENT: Pt is planning to have bariatric surgery.  Pt states he is doing all of the steps to prepare for bariatric surgery.  MD note indicated that he didn't think that pt is ready for surgery yet.  MD referred pt to PT to work on some conditioning exercises.  PT order 02/03/2023.  Reason for referral:  deconditioning  Pt had severe Covid and pneumonia 4 years ago.  He was in the hospital for approx 4.5 months and was in an induced coma for approx 3 months.  Pt developed ulcers on bilat heels while in hospital.  Pt transitioned to a SNF.  Pt received PT in the SNF though was limited due to ulcers in feet.  Pt states his R foot ulcer has pretty much healed though is L has not.  Pt goes to wound care weekly.  He states he's not supposed to walk a lot on his foot and was told by the wound care MD to stay off his feet as much as possible.   He states he walks to the bathroom in his home occasionally, but uses his W/C primarily in home.  He states he rarely walks  to his truck.  He is primarily in W/C.  When he does walk, he doesn't use the walker much.  Pt has decreased stamina and gets SOB.  Pt reports deficits in R grip strength.  Pt is very limited with standing activities due to L foot ulcer.  He is very limited in household chores.  Pt is able to cook and fold laundry in the W/C.   Pt states he has numbness in bilat distal LE's and feet and distal UE's which began after having covid.  Pt states he has some dropfoot on L.   PERTINENT HISTORY:  L foot ulcer--Pt primarily in W/C and not putting much weight through L LE.  Pt was instructed to stay off his foot as much as possible.  He is in a offloader shoe. Debridement of ulcers in bilat feet and skin graft in 2021.  R foot ulcer with good healing.  Pt receives wound care for L foot ulcer.  Pt states he has some dropfoot on L.   Long covid, chronic back pain, arthritis, depression, HTN, Dyspnea,  Lumbar surgery for bulging disc in 2002 R shoulder arthrotomy of AC jt, irrigation and debridement of distal clavicle, and partial excision of distal clavicle on 2019   PAIN:  Location:  back, feet, and bilat feet NPRS:  2/10 current, 8/10 worst, 2/10 best  PRECAUTIONS: L foot ulcer--avoid WB'ing of L LE, long covid, lumbar surgery in 2002    WEIGHT BEARING RESTRICTIONS: Pt states he doesn't have specific WB'ing restrictions though was instructed to stay off his foot as much as possible due to L foot ulcer.    FALLS:  Has patient fallen in last 6 months? Yes. Number of falls 2  LIVING ENVIRONMENT: Lives with: lives with their family Lives in: 1 story home Stairs: 5-6 steps with rails; has a ramp Has following equipment at home: W/C, FWW  OCCUPATION: Pt is on disability.  PLOF: Independent  PATIENT GOALS: improve muscle strength and stamina, to be able to walk again.    OBJECTIVE:   DIAGNOSTIC FINDINGS:  Chest x ray on 03/02/2023: FINDINGS: Stable cardiac and mediastinal contours. Similar  scarring within the left mid and lower lung and right upper lung. No pleural effusion or pneumothorax. Thoracic spine degenerative changes.   IMPRESSION: No acute cardiopulmonary process. Similar  scarring.  PATIENT SURVEYS:  LEFS 8/80   COGNITION: Overall cognitive status: Within functional limits for tasks assessed     OBSERVATION: Pt in W/C.  Pt wearing an offloader shoe on L with a gauze wrap/dressing on heel.  Pt has a wound on plantar surface of R foot that has mostly healed.  He does have some scabbing.  He states he was informed it is going to fill in with callus and skin.  POSTURE: In sitting:  bilat foot ER, R > L    LOWER EXTREMITY ROM:     AROM Right eval Left eval  Hip flexion    Hip extension    Hip abduction    Hip adduction    Hip internal rotation    Hip external rotation    Knee flexion 111 108  Knee extension 3/0 AROM/PROM 0  Ankle dorsiflexion 9 limited  Ankle plantarflexion 46   Ankle inversion    Ankle eversion     (Blank rows = not tested)  LOWER EXTREMITY MMT:    MMT Right eval Left eval  Hip flexion 5/5 4+/5  Hip extension    Hip abduction 5/5 5/5  Hip adduction    Hip internal rotation    Hip external rotation    Knee flexion    Knee extension 5/5 5/5  Ankle dorsiflexion 5/5   Ankle plantarflexion    Ankle inversion    Ankle eversion     (Blank rows = not tested)    GAIT: Not tested due to Pt's L foot ulcer.  TODAY'S TREATMENT:                                                                                                                                See below for pt education  PATIENT EDUCATION:  Education details: objective findings, POC, relevant anatomy, rationale of exercises Person educated: Patient Education method: Explanation Education comprehension: verbalized understanding  HOME EXERCISE PROGRAM: Will give next visit.   ASSESSMENT:  CLINICAL IMPRESSION: Patient is a 47 y.o. male with a dx of morbid  obesity who is planning on having bariatric surgery.  MD ordered PT due to deconditioning.  Pt is very limited with activity currently primarily due to his L foot ulcer.  He is primarily in a W/C and is not putting much weight through L LE.  He occasionally takes a few steps in his home and rarely takes steps to his truck.  He receives wound care for L foot ulcer and is primarily in W/C to allow L foot to heal.  Pt had severe Covid 4 years ago and developed bilat feet ulcers while in the hospital.  He is very limited in functional mobility and performance of household chores.  He performs the household chores that he is able to while sitting in W/C.  Pt reports having decreased stamina.  Pt has limited knee ROM bilat.  He has minimal weakness in L  hip flexion and is limited with actively performing DF.  Pt states he has dropfoot on L.  Pt may benefit from skilled PT services to address impairments and improve overall function.     OBJECTIVE IMPAIRMENTS: decreased activity tolerance, decreased endurance, decreased mobility, difficulty walking, decreased ROM, decreased strength, hypomobility, impaired flexibility, and pain.   ACTIVITY LIMITATIONS: carrying, standing, squatting, stairs, transfers, and locomotion level  PARTICIPATION LIMITATIONS: meal prep, cleaning, laundry, shopping, community activity, and occupation  PERSONAL FACTORS: Fitness and 3+ comorbidities: L foot ulcer, Wb'ing, long covid, lumbar surgery, chronic back pain, arthritis, depression  are also affecting patient's functional outcome.   REHAB POTENTIAL: good for goals  CLINICAL DECISION MAKING: Evolving/moderate complexity  EVALUATION COMPLEXITY: Low   GOALS:   SHORT TERM GOALS: Target date: 05/01/2023   Pt will perform HEP without adverse effects for improved ROM and strength.  Baseline: Goal status: INITIAL    LONG TERM GOALS: Target date: 05/29/2023  Pt will demo 0 deg of R knee extension AROM and at least 115 deg  of bilat knee flexion AROM for improved stiffness and mobility and for improved gait when he is able to ambulate more.   Baseline:  Goal status: INITIAL  2.  Pt will be independent and compliant with HEP for improved pain, ROM, strength, and function.  Baseline:  Goal status: INITIAL  3.  Pt will report at least a 50% improvement in stamina/tolerance with daily activities.  Baseline:  Goal status: INITIAL    PLAN:  PT FREQUENCY:  once every other week  PT DURATION: 8 weeks  PLANNED INTERVENTIONS: Therapeutic exercises, Therapeutic activity, Neuromuscular re-education, Balance training, Gait training, Patient/Family education, Self Care, Joint mobilization, Stair training, DME instructions, Electrical stimulation, Spinal mobilization, Cryotherapy, Moist heat, Taping, Ultrasound, Manual therapy, and Re-evaluation.  PLAN FOR NEXT SESSION: Avoid WB'ing of L LE until more healing has taken place and wound care.  Establish HEP.  Supine SLR, S/L abd, and LAQ next visit.  Knee ROM.     Audie Clear III PT, DPT 04/04/23 4:47 PM

## 2023-04-03 ENCOUNTER — Other Ambulatory Visit: Payer: Self-pay

## 2023-04-03 ENCOUNTER — Ambulatory Visit (HOSPITAL_BASED_OUTPATIENT_CLINIC_OR_DEPARTMENT_OTHER): Payer: PPO | Attending: Surgery | Admitting: Physical Therapy

## 2023-04-03 DIAGNOSIS — M6281 Muscle weakness (generalized): Secondary | ICD-10-CM | POA: Insufficient documentation

## 2023-04-03 DIAGNOSIS — M25661 Stiffness of right knee, not elsewhere classified: Secondary | ICD-10-CM | POA: Diagnosis not present

## 2023-04-03 DIAGNOSIS — R262 Difficulty in walking, not elsewhere classified: Secondary | ICD-10-CM | POA: Diagnosis not present

## 2023-04-03 DIAGNOSIS — M25662 Stiffness of left knee, not elsewhere classified: Secondary | ICD-10-CM | POA: Insufficient documentation

## 2023-04-04 ENCOUNTER — Encounter (HOSPITAL_BASED_OUTPATIENT_CLINIC_OR_DEPARTMENT_OTHER): Payer: Self-pay | Admitting: Physical Therapy

## 2023-04-04 NOTE — Progress Notes (Signed)
GOHAN, TURIN ([id number]) [id number] Page 1 of 17 Visit Report for 03/23/2023 Chief Complaint Document Details Patient Name: Date of Service: [name], TO [name] 03/23/2023 9:30 A M Medical Record Number: [id number] Patient Account Number: [id number] Date of Birth/Sex: Treating RN: 1975/08/12 (47 y.o. M) Primary Care Provider: Dorinda Hill Other Clinician: Referring Provider: Treating Provider/Extender: [name] in Treatment: 140 Information Obtained from: Patient Chief Complaint Bilateral Plantar Foot Ulcers Electronic Signature(s) Signed: 03/23/2023 9:56:00 AM By: [name] MD FACS Entered By: [name] on 03/23/2023 09:56:00 -------------------------------------------------------------------------------- Debridement Details Patient Name: Date of Service: [name], [name] 03/23/2023 9:30 A M Medical Record Number: [id number] Patient Account Number: [id number] Date of Birth/Sex: Treating RN: 1975-08-19 (47 y.o. [name] Primary Care Provider: Dorinda Hill Other Clinician: Referring Provider: Treating Provider/Extender: [name] in Treatment: 140 Debridement Performed for Assessment: Wound #2 Left Calcaneus Performed By: Physician [name], MD Debridement Type: Debridement Level of Consciousness (Pre-procedure): Awake and Alert Pre-procedure Verification/Time Out Yes - 09:50 Taken: Start Time: 09:52 Pain Control: Lidocaine 4% T opical Solution Percent of Wound Bed Debrided: 100% T Area Debrided (cm): otal 1.69 Tissue and other material debrided: Viable, Non-Viable, Slough, Slough Level: Non-Viable Tissue Debridement Description: Selective/Open Wound Instrument: Curette Bleeding: Minimum Hemostasis Achieved: Pressure End Time: 09:54 Procedural Pain: 0 Post Procedural Pain: 0 Response to Treatment: Procedure was tolerated well Level of Consciousness (Post- Awake  and Alert procedure): Post Debridement Measurements of Total Wound Length: (cm) 4.3 Stage: Category/Stage III Width: (cm) 0.5 Depth: (cm) 0.2 Volume: (cm) 0.338 Character of Wound/Ulcer Post Debridement: Improved [name], [name] ([id number]) [id number] Page 2 of 17 Post Procedure Diagnosis Same as Pre-procedure Notes Scribed for Dr Lady Gary by Brenton Grills RN Electronic Signature(s) Signed: 03/23/2023 10:16:35 AM By: [name] MD FACS Signed: 04/04/2023 2:03:55 PM By: Brenton Grills Entered By: Brenton Grills on 03/23/2023 09:52:08 -------------------------------------------------------------------------------- HPI Details Patient Name: Date of Service: [name], [name] 03/23/2023 9:30 A M Medical Record Number: [id number] Patient Account Number: [id number] Date of Birth/Sex: Treating RN: 1976-03-08 (47 y.o. M) Primary Care Provider: Dorinda Hill Other Clinician: Referring Provider: Treating Provider/Extender: [name] in Treatment: 140 History of Present Illness HPI Description: Wounds are12/03/2020 upon evaluation today patient presents for initial inspection here in our clinic concerning issues he has been having with the bottoms of his feet bilaterally. He states these actually occurred as wounds when he was hospitalized for 5 months secondary to Covid. He was apparently with tilting bed where he was in an upright position quite frequently and apparently this occurred in some way shape or form during that time. Fortunately there is no sign of active infection at this time. No fevers, chills, nausea, vomiting, or diarrhea. With that being said he still has substantial wounds on the plantar aspects of his feet Theragen require quite a bit of work to get these to heal. He has been using Santyl currently though that is been problematic both in receiving the medication as well as actually paid for it as it is become quite  expensive. Prior to the experience with Covid the patient really did not have any major medical problems other than hypertension he does have some mild generalized weakness following the Covid experience. 07/22/2020 on evaluation today patient appears to be doing okay in regard to his foot ulcers I feel like the wound beds are showing signs of better improvement that I do believe the Iodoflex is  helping in this regard. With that being said he does have a lot of drainage currently and this is somewhat blue/green in nature which is consistent with Pseudomonas. I do think a culture today would be appropriate for [place] to evaluate and see if that is indeed the case I would likely start him on antibiotic orally as well he is not allergic to Cipro knows of no issues he has had in the past 12/21; patient was admitted to the clinic earlier this month with bilateral presumed pressure ulcers on the bottom of his feet apparently related to excessive pressure from a tilt table arrangement in the intensive care unit. Patient relates this to being on ECMO but I am not really sure that is exactly related to that. I must say I have never seen anything like this. He has fairly extensive full-thickness wounds extending from his heel towards his midfoot mostly centered laterally. There is already been some healing distally. He does not appear to have an arterial issue. He has been using gentamicin to the wound surfaces with Iodoflex to help with ongoing debridement 1/6; this is a patient with pressure ulcers on the bottom of his feet related to excessive pressure from a standing position in the intensive care unit. He is complaining of a lot of pain in the right heel. He is not a diabetic. 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 We have been using Iodoflex to help prepare the surfaces of both wounds for an advanced treatment product. He is nonambulatory spending most of his time in a wheelchair  I have asked him not to propel the wheelchair with his heels 1/13; in general his wounds look better not much surface area change we have been using Iodoflex as of last week. I did an x-ray of the right heel as the patient was complaining of pain. I had some thoughts about a stress fracture perhaps Achilles tendon problems however what it showed was erosive changes along the inferior aspect of the calcaneus he now has a MRI booked for 1/20. 1/20; in general his wounds continue to be better. Some improvement in the large narrow areas proximally in his foot. He is still complaining of pain in the right heel and tenderness in certain areas of this wound. His MRI is tonight. I am not just looking for osteomyelitis that was brought up on the x-ray I am wondering about stress fractures, tendon ruptures etc. He has no such findings on the left. Also noteworthy is that the patient had critical illness neuropathy and some of the discomfort may be actual improvement in nerve function I am just not sure. These wounds were initially in the setting of severe critical illness related to COVID-19. He was put in a standing position. He may have also been on pressors at the point contributing to tissue ischemia. By his description at some point these wounds were grossly necrotic extending proximally up into the Achilles part of his heel. I do not know that I have ever really seen pictures of them like this although they may exist in epic We have ordered Tri layer Oasis. I am trying to stimulate some granulation in these areas. This is of course assuming the MRI is negative for infection 1/27; since the patient was last here he saw Dr. Earlene Plater of infectious disease. He is planned for vancomycin and ceftriaxone. Prior operative culture grew MSSA. Also ordered baseline lab work. He also ordered arterial studies although the ABIs in our clinic  were normal as well as his clinical exam these were normal I do not think he  needs to see vascular surgery. His ABIs at the [facility] were 1.22 in the right triphasic waveforms with a normal TBI of 1.15 on the left ABI of 1.22 with triphasic waveforms and a normal TBI of 1.08. Finally he saw Dr. Logan Bores who will follow him in for 2 months. At this point I do not think he felt that he needed a procedure on the right calcaneal bone. Dr. Earlene Plater is elected for broad-spectrum antibiotic The patient is still having pain in the right heel. He walks with a walker 2/3; wounds are generally smaller. He is tolerating his IV antibiotics. I believe this is vancomycin and ceftriaxone. We are still waiting for Oasis burn in terms of his out-of-pocket max which he should be meeting soon given the IV antibiotics, MRIs etc. I have asked him to check in on this. We are using silver collagen in the meantime the wounds look better [name], [name] ([id number]) [id number] Page 3 of 17 2/10; tolerating IV vancomycin and Rocephin. We are waiting to apply for Oasis. Although I am not really sure where he is in his out-of-pocket max. 2/17 started the first application of Oasis trilayer. Still on antibiotics. The wounds have generally look better. The area on the left has a little more surface slough requiring debridement 2/24; second application of Oasis trilayer. The wound surface granulation is generally look better. The area on the left with undermining laterally I think is come in a bit. 10/08/2020 upon evaluation today patient is here today for [employer] application #3. Fortunately he seems to be doing extremely well with regard to this and we are seeing a lot of new epithelial growth which is great news. Fortunately there is no signs of active infection at this time. 10/16/2020 upon evaluation today patient appears to be doing well with regard to his foot ulcers. Do believe the Oasis has been of benefit for him. I do not see any signs of infection right now which is great  news and I think that he has a lot of new epithelial growth which is great to see as well. The patient is very pleased to hear all of this. I do think we can proceed with the Oasis trilayer #4 today. 3/18; not as much improvement in these areas on his heels that I was hoping. I did reapply trilateral Oasis today the tissue looks healthier but not as much fill in as I was hoping. 3/25; better looking today I think this is come in a bit the tissue looks healthier. Triple layer Oasis reapplied #6 4/1; somewhat better looking definitely better looking surface not as much change in surface area as I was hoping. He may be spending more time [place] on days then he needs to although he does have heel offloading boots. Triple layer Oasis reapplied #7 4/7; unfortunately apparently [facility] will not approve any further Oasis which is unfortunate since the patient did respond nicely both in terms of the condition of the wound bed as well as surface area. There is still some drainage coming from the wound but not a lot there does not appear to be any infection 4/15; we have been using Hydrofera Blue. He continues to have nice rims of epithelialization on the right greater than the left. The left the epithelialization is coming from the tip of his heel. There is moderate drainage. In this that concerns me  about a total contact cast. There is no evidence of infection 4/29; patient has been using Hydrofera Blue with dressing changes. He has no complaints or issues today. 5/5; using Hydrofera Blue. I actually think that he looks marginally better than the last time I saw this 3 weeks ago. There are rims of epithelialization on the left thumb coming from the medial side on the right. Using Hydrofera Blue 5/12; using Hydrofera Blue. These continue to make improvements in surface area. His drainage was not listed as severe I therefore went ahead and put a cast on the left foot. Right foot we will continue  to dress his previous 5/16; back for first total contact cast change. He did not tolerate this particularly well cast injury on the anterior tibia among other issues. Difficulty sleeping. I talked him about this in some detail and afterwards is elected to continue. I told him I would like to have a cast on for 3 weeks to see if this is going to help at all. I think he agreed 5/19; I think the wound is better. There is no tunneling towards his midfoot. The undermining medially also looks better. He has a rim of new skin distally. I think we are making progress here. The area on the left also continues to look somewhat better to me using Hydrofera Blue. He has a list of complaints about the cast but none of them seem serious 5/26; patient presents for 1 week follow-up. He has been using a total contact cast and tolerating this well. Hydrofera Blue is the main dressing used. He denies signs of infection. 6/2 Hydrofera Blue total contact cast on the left. These were large ulcers that formed in intensive care unit where the patient was recovering from COVID. May have had something to do with being ventilated in an upright positiono Pressors etc. We have been able to get the areas down considerably and a viable surface. There is some epithelialization in both sides. Note made of drainage 6/9; changed to [facility] last time because of drainage. He arrives with better looking surfaces and dimensions on the left than the right. Paradoxically the right actually probes more towards his midfoot the left is largely close down but both of these look improved. Using a total contact cast on the left 6/16; complex wounds on his bilateral plantar heels which were initially pressure injury from a stay in the ICU with COVID. We have been using silver alginate most recently. His dimensions of come in quite dramatically however not recently. We have been putting the left foot in a total contact cast 6/23; complex  wounds on the bilateral plantar heels. I been putting the left in the cast paradoxically the area on the right is the one that is going towards closure at a faster rate. Quite a bit of drainage on the left. The patient went to see Dr. Logan Bores who said he was going to standby for skin grafts. I had actually considered sending him for skin grafts however he would be mandatorily off his feet for a period of weeks to months. I am thinking that the area on the right is going to close on its own the area on the left has been more stubborn even though we have him in a total contact cast 6/30; took him out of a total contact cast last week is the right heel seem to be making better progress than the left where I was placing the cast. We are using silver alginate.  Both wounds are smaller right greater than left 7/12; both wounds look as though they are making some progress. We are using silver alginate. Heel offloading boots 7/26; very gradual progress especially on the right. Using silver alginate. He is wearing heel offloading boots 8/18; he continues to close these wounds down very gradually. Using silver alginate. The problem polymen being definitive about this is areas of what appears to be callus around the margins. This is not a 100% of the area but certainly sizable especially on the right 9/1; bilateral plantar feet wounds secondary to prolonged pressure while being ventilated for COVID-19 in an upright position. Essentially pressure ulcers on the bottom of his feet. He is made substantial progress using silver alginate. 9/14; bilateral plantar feet wounds secondary to prolonged pressure. Making progress using silver alginate. 9/29 bilateral plantar feet wounds secondary to prolonged pressure. I changed him to Iodoflex last week. MolecuLight showing reddened blush fluorescence 10/11; patient presents for follow-up. He has no issues or complaints today. He denies signs of infection. He continues to use  Iodoflex and antibiotic ointment to the wound beds. 10/27; 2-week follow-up. No evidence of infection. He has callus and thick dry skin around the wound margins we have been using Iodoflex and Bactroban which was in response to a moderate left MolecuLight reddish blush fluorescence. 11/10; 2-week follow-up. Wound margins again have thick callus however the measurements of the actual wound sites are a lot smaller. Everything looks reasonably healthy here. We have been using Iodoflex He was approved for prime matrix but I have elected to delay this given the improvement in the surface area. Hopefully I will not regret that decision as were getting close to the end of the year in terms of insurance payment 12/8; 2-week follow-up. Wounds are generally smaller in size. These were initially substantial wounds extending into the forefoot all the way into the heel on the bilateral plantar feet. They are now both located on the plantar heel distal aspect both of these have a lot of callus around the wounds I used a #5 curette to remove this on the right and the left also some subcutaneous debris to try and get the wound edges were using Iodoflex. He has heel offloading shoe 12/22; 2-week follow-up. Not really much improvement. He has thick callus around the outer edges of both wounds. I remove this there is some nonviable subcutaneous tissue as well. We have been using Iodoflex. Her intake nurse and myself spontaneously thought of a total contact cast I went back in May. At that time we really were not seeing much of an improvement with a cast although the wound was in a much different situation I would like to retry this in 2 weeks and I discussed this with the patient 08/12/2021; the patient has had some improvement with the Iodoflex. The the area on the left heel plantar more improved than the right. I had to put him in a total contact cast on the left although I decided to put that off for 2 weeks. I am  going to change his primary dressing to silver collagen. I think in both areas he has had some improvement most of the healing seems to be more proximal in the heel. The wounds are in the mid aspect. A lot of thick callus on the right heel however. 1/19; we are using silver collagen on both plantar heel areas. He has had some improvement today. The left did not require any debridement. He still had some  eschar on the right that was debrided but both seem to have contracted. I did not put it total contact cast on him today 2/2 we have been using silver collagen. The area on the right plantar heel has areas that appear to be epithelialized interspersed with dry flaking callus and dry skin. I removed this. This really looks better than on the other side. On the left still a large area with raised edges and debris on the surface. 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 He has no issues or complaints today. 2/9; not much change in the left foot wound with 1 week of a cast we are using silver collagen. Silver collagen on the right side. The right side has been the better wound surface. We will reapply the total contact cast on the left [name], [name] ([id number]) [id number] Page 4 of 17 2/16; not much improvement on either side I been using silver collagen with a total contact cast on the left. I'm changing the Hydrofera Blue still with a total contact cast on the left 2/23; some improvement on both sides. Disappointing that he has thick callus around the area that we are putting in a total contact cast on the left. We've been using Hydrofera Blue on both wound areas. This is a man who at essentially pressure ulcers in addition to ischemia caused by medications to support his blood pressure (pressors) in the ICU. He was being ventilated in the  standing position for severe Covid. A Shiley the wounds extended across his entire foot but are now localized to his plantar heels bilaterally. We have made progress however neither areas healed. I continue to think the total contact cast is helped albeit painstakingly slowly. He has never wanted a plastic surgery consult although I don't know that they would be interested in grafting in area in this location. 10/07/2021: Continued improvement bilaterally. There is still some callus around the left wound, despite the total contact cast. He has some increased pain in his right midfoot around 1 particular area. This has been painful in the past but seems to be a little bit worse. When his cast was removed today, there was an area on the heel of the left foot that looks a bit macerated. He is also complaining of pain in his left thigh and hip which he thinks is secondary to the limb length discrepancy caused by the cast. 10/14/2021: He continues to improve. A little bit less callus around the left wound. He continues to endorse pain in his right midfoot, but this is not as significant as it was last week. The maceration on his left heel is improved. 10/21/2021: Continued improvement to both wounds. The maceration on his left heel is no longer evident. Less callus bilaterally. Epithelialization progressing. 10/28/2021: Significant improvement this week. The right sided wound is nearly closed with just a small open area at the middle. No maceration seen on the left heel. Continued epithelialization on both sides. No concern for infection. 11/04/2021: T oday, the wounds were measured a little bit differently and come out as larger, but I actually think they are about the same to potentially even smaller, particularly on the left. He continues to accumulate some callus on the right. 11/11/2021: T oday, the patient is expressing some concern that the left wound, despite being in the total contact cast, is not  progressing at the same rate as the 1 on the right. He is interested in trying a week without the cast to see how the wound does. The wounds are roughly the same size as last week, with the right perhaps being a little bit smaller. He continues to build up callus on both sites. 11/18/2021: Last week, I permitted the patient to go without his total contact cast, just to see if the cast was really making any difference. Today, both wounds have deteriorated to some extent, suggesting that the cast is providing benefit, at least on the left. Both are larger and have accumulated callus, slough, and other debris. 11/26/2021: I debrided both wounds quite aggressively last week in an effort to stimulate the healing cascade. This appears to have been effective as the left sided wound is a full centimeter shorter in length. Although the right was measured slightly larger, on inspection, it looks as though an area of epithelialized tissue was included in the measurements. We have been using PolyMem Ag on the wound surfaces with a total contact cast to the left. 12/02/2021: It appears that the intake personnel are including epithelialized tissue in his wound measurements; the right wound is almost completely epithelialized; there is just a crater at the proximal midfoot with some open areas. On the left, he has built up some callus, but the overall wound surface looks good. There is some senescent skin around the wound margin. He has been in PolyMem Ag bilaterally with a total contact cast on the left. 12/09/2021: The right wound is nearly closed; there is just a small open area at the mid calcaneus. On the left, the wound is smaller with minimal callus buildup. No significant drainage. 12/16/2021: The right calcaneal wound remains minimally open at the mid calcaneus; the rest has epithelialized. On the left, the wound is also a little bit smaller. There is some senescent tissue on the periphery. He is getting his  first application of a trial skin substitute called Vendaje today. 12/23/2021: The wound on his right calcaneus is nearly closed; there is just a small area at the most distal aspect of the calcaneus that is open. On the left, the area where we applied to the skin substitute has a healthier-looking bed of granulation tissue. The wound dimensions are not significantly different on this side but the wound surface is improved. 12/30/2021: The wound on the right calcaneus has not changed significantly aside from some accumulation of callus. On the left, the open area is smaller and continues to have an improved surface. He continues to accumulate callus around the wound. He is here for his third application of Vendaje. 01/06/2022: The right calcaneal wound is down to just a couple of millimeters. He continues to accumulate periwound callus. He unfortunately got his cast wet earlier in the week and his left foot is macerated, resulting in some superficial skin loss just distal to the open wound. The open wound itself, however, is much smaller and has a healthier appearing surface. He is here for his fourth application of Vendaje. 01/13/2022: The right calcaneal wound is about the same. Unfortunately, once again, his cast got wet and his foot is again macerated. This is caused the left calcaneal wound to enlarge. He is here for his fifth application of Vendaje. 01/20/2022: The right calcaneal wound is very small. There is some periwound callus accumulation. He purchased a new cast protector last week and this has been effective in avoiding the maceration that has been occurring  on the left. The left calcaneal wound is narrower and has a healthy and viable-appearing surface. He is here for his 6 application of Vendaje. 01/27/2022: The right calcaneal wound is down to just a pinhole. There is some periwound slough and callus. On the left, the wound is narrower and shorter by about a centimeter. The surface is robust  and viable-appearing. Unfortunately, the rep for the trial skin substitute product did not provide any for [place] to use today. 02/04/2022: The right calcaneal wound remains unchanged. There is more accumulated callus. On the left, although the intake nurse measured it a little bit longer, it looks about the same to me. The surface has a layer of slough, but underneath this, there is good granulation tissue. 02/10/2022: The right calcaneus wound is nearly closed. There is still some callus that builds up around the site. The left side looks about the same in terms of dimensions, but the surface is more robust and vital-appearing. 02/16/2022: The area of the right calcaneus that was nearly closed last week has closed, but there is a small opening at the mid foot where it looks like some moisture got retained and caused some reopening. The left foot wound is narrower and shallower. Both sites have a fair amount of periwound callus and eschar. 02/24/2022: The small midfoot opening on the right calcaneus is a little bit smaller today. The left foot wound is narrower and shallower. He continues to accumulate periwound callus. No concern for infection. 03/01/2022: The patient came to clinic early because he showered and got his cast wet. Fortunately, there is no significant maceration to his foot but the callus softened and it looks like the wound on his left calcaneus may be a little bit wider. The wound on his right calcaneus is just a narrow slit. Continued accumulation of periwound callus bilaterally. 03/08/2022: The wound on his right calcaneus is very nearly closed, just a small pinpoint opening under a bit of eschar; the left wound has come in quite a bit, as well. It is narrower and shorter than at our last visit. Still with accumulated callus and eschar bilaterally. 03/17/2022: The right calcaneal wound is healed. The left wound is smaller and the surface itself is very clean, but there is some blue-green  staining on the periwound callus, concerning for Pseudomonas aeruginosa. [name], [name] ([id number]) [id number] Page 5 of 17 03/23/2022: The right calcaneal wound remains closed. The left wound continues to contract. No further blue-green staining. Small amount of callus and slough accumulation. 03/28/2022: He came in early today because he had gotten his cast wet. On inspection, the wound itself did not get wet or macerated, just a little bit of the forefoot. The wound itself is basically unchanged. 04/07/2022: The right foot wound remains closed. The left wound is the smallest that I have seen it to date. It is narrower and shorter. It still continues to accumulate slough on the surface. 04/15/2022: There is a band of epithelium now dividing the small left plantar foot wound in 2. There is still some slough on the surface. 04/21/2022: The wound continues to narrow. Just a little bit of slough on the surface. He seems to be responding well to endoform. 04/28/2022: Continued slow contraction of the wound. There is a little slough on the surface and some periwound callus. We have been using endoform and total contact cast. 05/05/2022: The wound appears to have stalled. There is slough and some periwound eschar/callus. No concern for infection, however. 05/12/2022: Unfortunately,  his right foot has reopened. It is located at the most posterior aspect of his surgical incision. The area was noted to have drainage coming from it when his padding was removed today. Underneath some callus and senescent skin, there is an opening. No purulent drainage or malodor. On the left foot, the wound is again unchanged. There is some light blue staining on the callus, but no malodor or purulent drainage. 10/13; right and left heel remanence of extensive plantar foot wounds. These are better than I remember by quite a big margin however he is still left with wounds on the left plantar heel and the  right plantar heel. Been using endoform bilaterally. A culture was done that showed apparently Pseudomonas but we are still waiting for the [facility] antibiotic to use gentamicin today. He is still very active by description I am not sure about the offloading of his noncasted right foot 10/20; both wounds right and left heel debrided not much change from last week. [name] Echevaria has arrived which is linezolid, gentamicin and ciprofloxacin we will use this with endoform. T contact cast on the left otal 06/02/2022: Both wounds are smaller today. There is still a fair amount of callus buildup around the right foot ulcer. The left is more superficial and nearly flush with the surrounding tissues. Also with slough and eschar buildup. 06/10/2022: The right sided wound appears to be nearly closed, if not completely so, although it is somewhat difficult to tell given the abnormal tissue and scarring in his foot. There is a fair amount of callus and crust accumulation. On the left, the wound looks about the same, again with callus and slough. He has an appointment next Thursday with Dr. Annamary Rummage in podiatry; I am hopeful that there may be some reconstructive options available for Mr. Schielke. 06/16/2022: Both wounds have some eschar and callus accumulation. The right sided wound is extremely narrow and barely open; the left is narrower than last week. There is a little bit of slough. He has his appointment in podiatry later today. 06/23/2022: The patient met with Dr. Annamary Rummage last week and unfortunately, there are no reconstructive options that he believes would be helpful. He did order an MRI to evaluate for osteomyelitis and fortunately, none was seen. The left sided wound is a little bit shorter and narrower today. The right sided wound is about the same. There is callus and eschar accumulation bilaterally. 06/29/2022: Both feet have improved from last week. There is epithelium making a valiant effort to creep  across the surface on the left. The right side looks like it got a little dry and the deep crevasse in his midfoot has cracked. Both have eschar and there is some slough on the left. 07/07/2022: Both feet have improved. There is epithelium completely covering the calcaneus on the right with just a small opening in the crevasse in his midfoot. On the left, the open area of tissue is smaller but he continues to build up callus/eschar and slough. 07/15/2022: The opening in the midfoot on the right is about the same size, covered with eschar and a little bit of slough. The open portion of the left wound is narrower and shorter with a bit of slough buildup. He admits to being on his feet more than recommended. 12/14; as far as I can tell everything on the right foot is closed. There is some eschar I removed some of this I cannot identify any open wound here. As usual this will be a very vulnerable  area going forward. On the left this looks really quite healthy. I was pleasantly surprised to see how good this looked. Wound is certainly smaller and there appears to be healthy epithelialization. He has been using Promogran on the right and endoform on the left. He has been offloading the right foot with a heel offloading boot and he has a running shoe on the right foot 08/04/2022: The right foot remains closed. He has a thick cushioned insole in his sneaker. The left sided wound is smaller with just some slough and eschar accumulation. He is wearing the heel off loader on this foot. 08/15/2022: The right foot remains closed. The left wound has narrowed further. There is some slough and eschar accumulation. 08/25/2022: We put him in a peg assist shoe insert and as a result, he has more epithelialization of the ulcer with minimal slough and eschar accumulation. 09/01/2022: The wound is smaller by about half this week. Still with some slough on the surface. The peg assist shoe seems to be doing a remarkable job  of adequately offloading the site. 09/08/2022: There is a little bit more epithelium coming in. There is some slough and callus buildup. 09/16/2022: The wound measures about the same size, but the epithelium that has grown in looks more robust and stronger. There is some slough and callus buildup. 09/23/2022: The wound remains about the same size. The skin edges are looking rather senescent. 09/29/2022: The aggressive debridement I performed last week seems to have been effective. The wound is smaller and has significantly less slough accumulation. 10/06/2022: There has been quite a bit of epithelialization since last week. There are still some open areas with slough accumulation. There is some callus buildup around the perimeter. 10/13/2022: Continued improvement. Minimal callus accumulation. 3/14; patient presents for follow-up. He has been using endoform to the wound bed without issues. He is using a surgical shoe with peg assist for offloading. 10/27/2022: The wound dimensions are stable. There is some senescent skin accumulation around the perimeter. 11/04/2022: Last week I performed a very aggressive debridement in an effort to stimulate the healing cascade. As has been the case when I have done this before, the wound has responded well. There has been epithelialization and contraction of the wound. There are just a couple of small open areas. 11/10/2022: Unfortunately, his foot got wet secondary to sweating and there has been some breakdown of the tissue, particularly the skin just adjacent to the LION, SCHRANTZ ([id number]) [id number] Page 6 of 17 main ulcer. 11/18/2022: We continue to struggle with moisture-related tissue breakdown around the perimeter of his wound. This has caused the thin epithelium that had formed on the surface to disintegrate. The underlying surface of the wound has good granulation tissue. 11/25/2022: The edges of the wound are much less macerated,  but the surface is actually looking a little bit dry. There is slough accumulation. No obvious signs of infection. 12/01/2022: The wound edges are more macerated and broken down, making the wound larger. The surface has some slough on it. 12/08/2020: The wound looks better this week. There is significantly less maceration and moisture-related tissue breakdown. There is some slough on the wound surface. 12/15/2022: The wound continues to improve. There is almost no moisture-related tissue breakdown. There is epithelium beginning to fill in again from the edges. Light slough on the wound surface. 12/22/2022: More epithelium has filled him from around the edges. There is some slough on the surface. No evidence of moisture-related tissue breakdown. 01/05/2023: There  has been more epithelialization, particularly at the posterior calcaneal aspect of the wound. There has been a little bit of moisture accumulation with minimal maceration. 01/12/2023: More epithelium has filled in. There is very minimal accumulation of slough. 01/20/2023: The wound is measuring a little bit smaller today. It did measure deeper, but this appears to be secondary to some heaped up senescent skin around the edges. The epithelium that has been present at the posterior aspect of the wound seems to be a bit more robust with greater integrity. 01/26/2023: He reports intense itching to the skin around the wound and there has been some breakdown here. It looks fungal in nature. The wound itself looks pretty good with improved epithelialization with just some slough buildup. He has a little callus along the wound edges. 02/02/2023: He has been treating the periwound skin with an over-the-counter athlete's foot medication and notes significant improvement in his pruritus. The skin looks better here, as well. The wound continues to epithelialize. There is light slough accumulation. 02/10/2023: The periwound skin still looks fairly angry. He reports  multiple small blisters that have opened up. He is not having the intense itching that he had prior, however. There has been more epithelialization at the calcaneus and there is minimal slough accumulation. 02/16/2023: The periwound skin is improving. An online review of dermatology rash images suggest that this may be hand-foot-and-mouth disease, isolated to the feet; the patient says he actually had this as a child. The calcaneus continues to epithelialize and the tissue was quite robust here. Very minimal slough accumulation on the open portion of the wound with a little bit of periwound callus buildup. 03/03/2023: The wound measurements are about the same, but there is more epithelium making its way to the surface. There is a little bit of crust and eschar around the edges and minimal slough on the surface. 03/09/2023: His dressing was applied rather haphazardly and the drawtex was not applied. The Optifoam was also reversed such that the orange side was in contact with his bare skin. He has some maceration around the wound edges, but this did not appear to result in any tissue breakdown. There is a little bit of slough on the surface and minimal eschar around the edges. 03/15/2023: The wound measured narrower today. There is some slough accumulation on the surface and some eschar around the edges. 03/23/2023: The length of the wound remains stable, but it continues to narrow. The skin that has covered the posterior aspect of the calcaneus is more robust and has better tensile integrity. Minimal slough and eschar accumulation. Electronic Signature(s) Signed: 03/23/2023 9:57:26 AM By: [name] MD FACS Entered By: [name] on 03/23/2023 09:57:25 -------------------------------------------------------------------------------- Physical Exam Details Patient Name: Date of Service: [name], [name] 03/23/2023 9:30 A M Medical Record Number: [id number] Patient Account Number: [id number] Date  of Birth/Sex: Treating RN: 1976/03/24 (47 y.o. M) Primary Care Provider: Dorinda Hill Other Clinician: Referring Provider: Treating Provider/Extender: [name] in Treatment: 140 Constitutional Hypertensive, asymptomatic. . . . no acute distress. Respiratory Normal work of breathing on room air. Notes 03/23/2023: The length of the wound remains stable, but it continues to narrow. The skin that has covered the posterior aspect of the calcaneus is more robust and has better tensile integrity. Minimal slough and eschar accumulation. Electronic Signature(s) [name], [name] ([id number]) [id number] Page 7 of 17 Signed: 03/23/2023 9:59:53 AM By: [name] MD FACS Entered By: [name] on 03/23/2023 09:59:52 -------------------------------------------------------------------------------- Physician Orders  Details Patient Name: Date of Service: [name], TO [name] 03/23/2023 9:30 A M Medical Record Number: [id number] Patient Account Number: [id number] Date of Birth/Sex: Treating RN: 03/31/1976 (47 y.o. [name] Primary Care Provider: Dorinda Hill Other Clinician: Referring Provider: Treating Provider/Extender: [name] in Treatment: 140 Verbal / Phone Orders: No Diagnosis Coding ICD-10 Coding Code Description [phone] Non-pressure chronic ulcer of other part of left foot with fat layer exposed Follow-up Appointments ppointment in 1 week. - Dr. Lady Gary Room 3 Return A Anesthetic Wound #2 Left Calcaneus (In clinic) Topical Lidocaine 4% applied to wound bed - In clinic Bathing/ Shower/ Hygiene May shower and wash wound with soap and water. Edema Control - Lymphedema / SCD / Other Bilateral Lower Extremities Avoid standing for long periods of time. Moisturize legs daily. - as needed Off-Loading Other: - keep pressure off of the bottom of your feet. Elevate legs throughout the day. Use the  Shoe with the PegAssist off-loading insole Additional Orders / Instructions Follow Nutritious Diet - Try to get 70-100 grams of Protein a day+ Wound Treatment Wound #2 - Calcaneus Wound Laterality: Left Cleanser: Normal Saline (Generic) 1 x Per Day/30 Days Discharge Instructions: Cleanse the wound with Normal Saline prior to applying a clean dressing using gauze sponges, not tissue or cotton balls. Cleanser: Wound Cleanser (Generic) 1 x Per Day/30 Days Discharge Instructions: Cleanse the wound with wound cleanser prior to applying a clean dressing using gauze sponges, not tissue or cotton balls. Peri-Wound Care: Ketoconazole Cream 2% 1 x Per Day/30 Days Discharge Instructions: Apply Ketoconazole mixed with zinc to the periwound Peri-Wound Care: Zinc Oxide Ointment 30g tube 1 x Per Day/30 Days Discharge Instructions: Apply Zinc Oxide to periwound with each dressing change Topical: Gentamicin (Generic) 1 x Per Day/30 Days Discharge Instructions: apply to the wound bed Prim Dressing: Maxorb Extra Ag+ Alginate Dressing, 4x4.75 (in/in) (Generic) 1 x Per Day/30 Days ary Discharge Instructions: Apply to wound bed as instructed Secondary Dressing: Drawtex 4x4 in (Generic) 1 x Per Day/30 Days Discharge Instructions: Apply over primary dressing as directed. Secondary Dressing: Optifoam Non-Adhesive Dressing, 4x4 in (Generic) 1 x Per Day/30 Days Discharge Instructions: Apply over primary dressing as directed. [name], [name] ([id number]) [id number] Page 8 of 17 Secondary Dressing: Woven Gauze Sponge, Non-Sterile 4x4 in (Generic) 1 x Per Day/30 Days Discharge Instructions: Apply over primary dressing as directed. Secured With: 35M Medipore H Soft Cloth Surgical T ape, 4 x 10 (in/yd) (Generic) 1 x Per Day/30 Days Discharge Instructions: Secure with tape as directed. Compression Wrap: Kerlix Roll 4.5x3.1 (in/yd) (Generic) 1 x Per Day/30 Days Discharge Instructions: Apply  Kerlix and Coban compression as directed. Add-Ons: [employer] Offloading Boot, Size Regular 1 x Per Day/30 Days Electronic Signature(s) Signed: 03/23/2023 10:16:35 AM By: [name] MD FACS Entered By: [name] on 03/23/2023 10:00:08 -------------------------------------------------------------------------------- Problem List Details Patient Name: Date of Service: [name], [name] 03/23/2023 9:30 A M Medical Record Number: [id number] Patient Account Number: [id number] Date of Birth/Sex: Treating RN: [birth year] (47 y.o. [name] Primary Care Provider: Dorinda Hill Other Clinician: Referring Provider: Treating Provider/Extender: [name] in Treatment: 140 Active Problems ICD-10 Encounter Code Description Active Date MDM Diagnosis [phone] Non-pressure chronic ulcer of other part of left foot with fat layer exposed 09/03/2020 No Yes Inactive Problems ICD-10 Code Description Active Date Inactive Date L97.512 Non-pressure chronic ulcer of other part of right foot with fat layer exposed 09/03/2020 09/03/2020 L89.893 Pressure  ulcer of other site, stage 3 07/15/2020 07/15/2020 M62.81 Muscle weakness (generalized) 07/15/2020 07/15/2020 I10 Essential (primary) hypertension 07/15/2020 07/15/2020 M86.171 Other acute osteomyelitis, right ankle and foot 09/03/2020 09/03/2020 Resolved Problems Electronic Signature(s) Signed: 03/23/2023 9:54:41 AM By: [name] MD FACS Entered By: [name] on 03/23/2023 09:54:41 [name] ([id number]) [id number] Page 9 of 17 -------------------------------------------------------------------------------- Progress Note Details Patient Name: Date of Service: [name], TO [name] 03/23/2023 9:30 A M Medical Record Number: [id number] Patient Account Number: [id number] Date of Birth/Sex: Treating RN: 1976-07-02 (47 y.o. M) Primary Care Provider: Dorinda Hill Other  Clinician: Referring Provider: Treating Provider/Extender: [name] in Treatment: 140 Subjective Chief Complaint Information obtained from Patient Bilateral Plantar Foot Ulcers History of Present Illness (HPI) Wounds are12/03/2020 upon evaluation today patient presents for initial inspection here in our clinic concerning issues he has been having with the bottoms of his feet bilaterally. He states these actually occurred as wounds when he was hospitalized for 5 months secondary to Covid. He was apparently with tilting bed where he was in an upright position quite frequently and apparently this occurred in some way shape or form during that time. Fortunately there is no sign of active infection at this time. No fevers, chills, nausea, vomiting, or diarrhea. With that being said he still has substantial wounds on the plantar aspects of his feet Theragen require quite a bit of work to get these to heal. He has been using Santyl currently though that is been problematic both in receiving the medication as well as actually paid for it as it is become quite expensive. Prior to the experience with Covid the patient really did not have any major medical problems other than hypertension he does have some mild generalized weakness following the Covid experience. 07/22/2020 on evaluation today patient appears to be doing okay in regard to his foot ulcers I feel like the wound beds are showing signs of better improvement that I do believe the Iodoflex is helping in this regard. With that being said he does have a lot of drainage currently and this is somewhat blue/green in nature which is consistent with Pseudomonas. I do think a culture today would be appropriate for [place] to evaluate and see if that is indeed the case I would likely start him on antibiotic orally as well he is not allergic to Cipro knows of no issues he has had in the past 12/21; patient was admitted to the clinic  earlier this month with bilateral presumed pressure ulcers on the bottom of his feet apparently related to excessive pressure from a tilt table arrangement in the intensive care unit. Patient relates this to being on ECMO but I am not really sure that is exactly related to that. I must say I have never seen anything like this. He has fairly extensive full-thickness wounds extending from his heel towards his midfoot mostly centered laterally. There is already been some healing distally. He does not appear to have an arterial issue. He has been using gentamicin to the wound surfaces with Iodoflex to help with ongoing debridement 1/6; this is a patient with pressure ulcers on the bottom of his feet related to excessive pressure from a standing position in the intensive care unit. He is complaining of a lot of pain in the right heel. He is not a diabetic. 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 We have been using Iodoflex to help prepare the surfaces of both wounds for an  advanced treatment product. He is nonambulatory spending most of his time in a wheelchair I have asked him not to propel the wheelchair with his heels 1/13; in general his wounds look better not much surface area change we have been using Iodoflex as of last week. I did an x-ray of the right heel as the patient was complaining of pain. I had some thoughts about a stress fracture perhaps Achilles tendon problems however what it showed was erosive changes along the inferior aspect of the calcaneus he now has a MRI booked for 1/20. 1/20; in general his wounds continue to be better. Some improvement in the large narrow areas proximally in his foot. He is still complaining of pain in the right heel and tenderness in certain areas of this wound. His MRI is tonight. I am not just looking for osteomyelitis that was brought up on the x-ray I am wondering about stress fractures, tendon ruptures etc. He has no such findings  on the left. Also noteworthy is that the patient had critical illness neuropathy and some of the discomfort may be actual improvement in nerve function I am just not sure. These wounds were initially in the setting of severe critical illness related to COVID-19. He was put in a standing position. He may have also been on pressors at the point contributing to tissue ischemia. By his description at some point these wounds were grossly necrotic extending proximally up into the Achilles part of his heel. I do not know that I have ever really seen pictures of them like this although they may exist in epic We have ordered Tri layer Oasis. I am trying to stimulate some granulation in these areas. This is of course assuming the MRI is negative for infection 1/27; since the patient was last here he saw Dr. Earlene Plater of infectious disease. He is planned for vancomycin and ceftriaxone. Prior operative culture grew MSSA. Also ordered baseline lab work. He also ordered arterial studies although the ABIs in our clinic were normal as well as his clinical exam these were normal I do not think he needs to see vascular surgery. His ABIs at the [facility] were 1.22 in the right triphasic waveforms with a normal TBI of 1.15 on the left ABI of 1.22 with triphasic waveforms and a normal TBI of 1.08. Finally he saw Dr. Logan Bores who will follow him in for 2 months. At this point I do not think he felt that he needed a procedure on the right calcaneal bone. Dr. Earlene Plater is elected for broad-spectrum antibiotic The patient is still having pain in the right heel. He walks with a walker 2/3; wounds are generally smaller. He is tolerating his IV antibiotics. I believe this is vancomycin and ceftriaxone. We are still waiting for Oasis burn in terms of his out-of-pocket max which he should be meeting soon given the IV antibiotics, MRIs etc. I have asked him to check in on this. We are using silver collagen in the meantime the wounds look  better 2/10; tolerating IV vancomycin and Rocephin. We are waiting to apply for Oasis. Although I am not really sure where he is in his out-of-pocket max. 2/17 started the first application of Oasis trilayer. Still on antibiotics. The wounds have generally look better. The area on the left has a little more surface slough requiring debridement 2/24; second application of Oasis trilayer. The wound surface granulation is generally look better. The area on the left with undermining laterally I think is come in a  bit. 10/08/2020 upon evaluation today patient is here today for [employer] application #3. Fortunately he seems to be doing extremely well with regard to this and we are seeing a lot of new epithelial growth which is great news. Fortunately there is no signs of active infection at this time. 10/16/2020 upon evaluation today patient appears to be doing well with regard to his foot ulcers. Do believe the Oasis has been of benefit for him. I do not see any signs of infection right now which is great news and I think that he has a lot of new epithelial growth which is great to see as well. The patient is very pleased to hear all of this. I do think we can proceed with the Oasis trilayer #4 today. 3/18; not as much improvement in these areas on his heels that I was hoping. I did reapply trilateral Oasis today the tissue looks healthier but not as much fill [name], [name] ([id number]) [id number] Page 10 of 17 in as I was hoping. 3/25; better looking today I think this is come in a bit the tissue looks healthier. Triple layer Oasis reapplied #6 4/1; somewhat better looking definitely better looking surface not as much change in surface area as I was hoping. He may be spending more time [place] on days then he needs to although he does have heel offloading boots. Triple layer Oasis reapplied #7 4/7; unfortunately apparently [facility] will not approve any further  Oasis which is unfortunate since the patient did respond nicely both in terms of the condition of the wound bed as well as surface area. There is still some drainage coming from the wound but not a lot there does not appear to be any infection 4/15; we have been using Hydrofera Blue. He continues to have nice rims of epithelialization on the right greater than the left. The left the epithelialization is coming from the tip of his heel. There is moderate drainage. In this that concerns me about a total contact cast. There is no evidence of infection 4/29; patient has been using Hydrofera Blue with dressing changes. He has no complaints or issues today. 5/5; using Hydrofera Blue. I actually think that he looks marginally better than the last time I saw this 3 weeks ago. There are rims of epithelialization on the left thumb coming from the medial side on the right. Using Hydrofera Blue 5/12; using Hydrofera Blue. These continue to make improvements in surface area. His drainage was not listed as severe I therefore went ahead and put a cast on the left foot. Right foot we will continue to dress his previous 5/16; back for first total contact cast change. He did not tolerate this particularly well cast injury on the anterior tibia among other issues. Difficulty sleeping. I talked him about this in some detail and afterwards is elected to continue. I told him I would like to have a cast on for 3 weeks to see if this is going to help at all. I think he agreed 5/19; I think the wound is better. There is no tunneling towards his midfoot. The undermining medially also looks better. He has a rim of new skin distally. I think we are making progress here. The area on the left also continues to look somewhat better to me using Hydrofera Blue. He has a list of complaints about the cast but none of them seem serious 5/26; patient presents for 1 week follow-up. He has been using a total  contact cast and tolerating  this well. Hydrofera Blue is the main dressing used. He denies signs of infection. 6/2 Hydrofera Blue total contact cast on the left. These were large ulcers that formed in intensive care unit where the patient was recovering from COVID. May have had something to do with being ventilated in an upright positiono Pressors etc. We have been able to get the areas down considerably and a viable surface. There is some epithelialization in both sides. Note made of drainage 6/9; changed to [facility] last time because of drainage. He arrives with better looking surfaces and dimensions on the left than the right. Paradoxically the right actually probes more towards his midfoot the left is largely close down but both of these look improved. Using a total contact cast on the left 6/16; complex wounds on his bilateral plantar heels which were initially pressure injury from a stay in the ICU with COVID. We have been using silver alginate most recently. His dimensions of come in quite dramatically however not recently. We have been putting the left foot in a total contact cast 6/23; complex wounds on the bilateral plantar heels. I been putting the left in the cast paradoxically the area on the right is the one that is going towards closure at a faster rate. Quite a bit of drainage on the left. The patient went to see Dr. Logan Bores who said he was going to standby for skin grafts. I had actually considered sending him for skin grafts however he would be mandatorily off his feet for a period of weeks to months. I am thinking that the area on the right is going to close on its own the area on the left has been more stubborn even though we have him in a total contact cast 6/30; took him out of a total contact cast last week is the right heel seem to be making better progress than the left where I was placing the cast. We are using silver alginate. Both wounds are smaller right greater than left 7/12; both wounds look  as though they are making some progress. We are using silver alginate. Heel offloading boots 7/26; very gradual progress especially on the right. Using silver alginate. He is wearing heel offloading boots 8/18; he continues to close these wounds down very gradually. Using silver alginate. The problem polymen being definitive about this is areas of what appears to be callus around the margins. This is not a 100% of the area but certainly sizable especially on the right 9/1; bilateral plantar feet wounds secondary to prolonged pressure while being ventilated for COVID-19 in an upright position. Essentially pressure ulcers on the bottom of his feet. He is made substantial progress using silver alginate. 9/14; bilateral plantar feet wounds secondary to prolonged pressure. Making progress using silver alginate. 9/29 bilateral plantar feet wounds secondary to prolonged pressure. I changed him to Iodoflex last week. MolecuLight showing reddened blush fluorescence 10/11; patient presents for follow-up. He has no issues or complaints today. He denies signs of infection. He continues to use Iodoflex and antibiotic ointment to the wound beds. 10/27; 2-week follow-up. No evidence of infection. He has callus and thick dry skin around the wound margins we have been using Iodoflex and Bactroban which was in response to a moderate left MolecuLight reddish blush fluorescence. 11/10; 2-week follow-up. Wound margins again have thick callus however the measurements of the actual wound sites are a lot smaller. Everything looks reasonably healthy here. We have been using Iodoflex  He was approved for prime matrix but I have elected to delay this given the improvement in the surface area. Hopefully I will not regret that decision as were getting close to the end of the year in terms of insurance payment 12/8; 2-week follow-up. Wounds are generally smaller in size. These were initially substantial wounds extending into the  forefoot all the way into the heel on the bilateral plantar feet. They are now both located on the plantar heel distal aspect both of these have a lot of callus around the wounds I used a #5 curette to remove this on the right and the left also some subcutaneous debris to try and get the wound edges were using Iodoflex. He has heel offloading shoe 12/22; 2-week follow-up. Not really much improvement. He has thick callus around the outer edges of both wounds. I remove this there is some nonviable subcutaneous tissue as well. We have been using Iodoflex. Her intake nurse and myself spontaneously thought of a total contact cast I went back in May. At that time we really were not seeing much of an improvement with a cast although the wound was in a much different situation I would like to retry this in 2 weeks and I discussed this with the patient 08/12/2021; the patient has had some improvement with the Iodoflex. The the area on the left heel plantar more improved than the right. I had to put him in a total contact cast on the left although I decided to put that off for 2 weeks. I am going to change his primary dressing to silver collagen. I think in both areas he has had some improvement most of the healing seems to be more proximal in the heel. The wounds are in the mid aspect. A lot of thick callus on the right heel however. 1/19; we are using silver collagen on both plantar heel areas. He has had some improvement today. The left did not require any debridement. He still had some eschar on the right that was debrided but both seem to have contracted. I did not put it total contact cast on him today 2/2 we have been using silver collagen. The area on the right plantar heel has areas that appear to be epithelialized interspersed with dry flaking callus and dry skin. I removed this. This really looks better than on the other side. On the left still a large area with raised edges and debris on the  surface. 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 He has no issues or complaints today. 2/9; not much change in the left foot wound with 1 week of a cast we are using silver collagen. Silver collagen on the right side. The right side has been the better wound surface. We will reapply the total contact cast on the left 2/16; not much improvement on either side I been using silver collagen with a total contact cast on the left. I'm changing the Hydrofera Blue still with a total contact cast on the left 2/23; some improvement on both sides. Disappointing that he has thick callus around the area that we are putting in a total contact cast on the left. We've been using Hydrofera Blue on both wound areas. This is a man who at essentially pressure ulcers in addition to ischemia caused by medications to support his blood pressure (pressors) in the  ICU. He was being ventilated in the standing position for severe Covid. A Shiley the wounds extended across his entire foot but are now localized to his plantar heels bilaterally. We have made progress however neither areas healed. I continue to think the total contact cast is helped albeit painstakingly slowly. He has never wanted a plastic surgery consult although I don't know that they would be interested in grafting in area in this location. 10/07/2021: Continued improvement bilaterally. There is still some callus around the left wound, despite the total contact cast. He has some increased pain in his right midfoot around 1 particular area. This has been painful in the past but seems to be a little bit worse. When his cast was removed today, there was an area on the heel of the left foot that looks a bit macerated. He is also complaining of pain in his left thigh and hip which he thinks is secondary to the limb length [name], [name] ([id number]) [id number] Page 11 of 17 discrepancy caused by the cast. 10/14/2021: He continues to improve. A little bit less callus around the left wound. He continues to endorse pain in his right midfoot, but this is not as significant as it was last week. The maceration on his left heel is improved. 10/21/2021: Continued improvement to both wounds. The maceration on his left heel is no longer evident. Less callus bilaterally. Epithelialization progressing. 10/28/2021: Significant improvement this week. The right sided wound is nearly closed with just a small open area at the middle. No maceration seen on the left heel. Continued epithelialization on both sides. No concern for infection. 11/04/2021: T oday, the wounds were measured a little bit differently and come out as larger, but I actually think they are about the same to potentially even smaller, particularly on the left. He continues to accumulate some callus on the right. 11/11/2021: T oday, the patient is expressing some concern that the left wound, despite being in the total contact cast, is not progressing at the same rate as the 1 on the right. He is interested in trying a week without the cast to see how the wound does. The wounds are roughly the same size as last week, with the right perhaps being a little bit smaller. He continues to build up callus on both sites. 11/18/2021: Last week, I permitted the patient to go without his total contact cast, just to see if the cast was really making any difference. Today, both wounds have deteriorated to some extent, suggesting that the cast is providing benefit, at least on the left. Both are larger and have accumulated callus, slough, and other debris. 11/26/2021: I debrided both wounds quite aggressively last week in an effort to stimulate the healing cascade. This appears to have been effective as the left sided wound is a full centimeter shorter in length. Although the  right was measured slightly larger, on inspection, it looks as though an area of epithelialized tissue was included in the measurements. We have been using PolyMem Ag on the wound surfaces with a total contact cast to the left. 12/02/2021: It appears that the intake personnel are including epithelialized tissue in his wound measurements; the right wound is almost completely epithelialized; there is just a crater at the proximal midfoot with some open areas. On the left, he has built up some callus, but the overall wound surface looks good. There is some senescent skin around the wound margin. He has been in PolyMem Ag bilaterally with a  total contact cast on the left. 12/09/2021: The right wound is nearly closed; there is just a small open area at the mid calcaneus. On the left, the wound is smaller with minimal callus buildup. No significant drainage. 12/16/2021: The right calcaneal wound remains minimally open at the mid calcaneus; the rest has epithelialized. On the left, the wound is also a little bit smaller. There is some senescent tissue on the periphery. He is getting his first application of a trial skin substitute called Vendaje today. 12/23/2021: The wound on his right calcaneus is nearly closed; there is just a small area at the most distal aspect of the calcaneus that is open. On the left, the area where we applied to the skin substitute has a healthier-looking bed of granulation tissue. The wound dimensions are not significantly different on this side but the wound surface is improved. 12/30/2021: The wound on the right calcaneus has not changed significantly aside from some accumulation of callus. On the left, the open area is smaller and continues to have an improved surface. He continues to accumulate callus around the wound. He is here for his third application of Vendaje. 01/06/2022: The right calcaneal wound is down to just a couple of millimeters. He continues to accumulate periwound  callus. He unfortunately got his cast wet earlier in the week and his left foot is macerated, resulting in some superficial skin loss just distal to the open wound. The open wound itself, however, is much smaller and has a healthier appearing surface. He is here for his fourth application of Vendaje. 01/13/2022: The right calcaneal wound is about the same. Unfortunately, once again, his cast got wet and his foot is again macerated. This is caused the left calcaneal wound to enlarge. He is here for his fifth application of Vendaje. 01/20/2022: The right calcaneal wound is very small. There is some periwound callus accumulation. He purchased a new cast protector last week and this has been effective in avoiding the maceration that has been occurring on the left. The left calcaneal wound is narrower and has a healthy and viable-appearing surface. He is here for his 6 application of Vendaje. 01/27/2022: The right calcaneal wound is down to just a pinhole. There is some periwound slough and callus. On the left, the wound is narrower and shorter by about a centimeter. The surface is robust and viable-appearing. Unfortunately, the rep for the trial skin substitute product did not provide any for [place] to use today. 02/04/2022: The right calcaneal wound remains unchanged. There is more accumulated callus. On the left, although the intake nurse measured it a little bit longer, it looks about the same to me. The surface has a layer of slough, but underneath this, there is good granulation tissue. 02/10/2022: The right calcaneus wound is nearly closed. There is still some callus that builds up around the site. The left side looks about the same in terms of dimensions, but the surface is more robust and vital-appearing. 02/16/2022: The area of the right calcaneus that was nearly closed last week has closed, but there is a small opening at the mid foot where it looks like some moisture got retained and caused some  reopening. The left foot wound is narrower and shallower. Both sites have a fair amount of periwound callus and eschar. 02/24/2022: The small midfoot opening on the right calcaneus is a little bit smaller today. The left foot wound is narrower and shallower. He continues to accumulate periwound callus. No concern for infection. 03/01/2022:  The patient came to clinic early because he showered and got his cast wet. Fortunately, there is no significant maceration to his foot but the callus softened and it looks like the wound on his left calcaneus may be a little bit wider. The wound on his right calcaneus is just a narrow slit. Continued accumulation of periwound callus bilaterally. 03/08/2022: The wound on his right calcaneus is very nearly closed, just a small pinpoint opening under a bit of eschar; the left wound has come in quite a bit, as well. It is narrower and shorter than at our last visit. Still with accumulated callus and eschar bilaterally. 03/17/2022: The right calcaneal wound is healed. The left wound is smaller and the surface itself is very clean, but there is some blue-green staining on the periwound callus, concerning for Pseudomonas aeruginosa. 03/23/2022: The right calcaneal wound remains closed. The left wound continues to contract. No further blue-green staining. Small amount of callus and slough accumulation. 03/28/2022: He came in early today because he had gotten his cast wet. On inspection, the wound itself did not get wet or macerated, just a little bit of the forefoot. The wound itself is basically unchanged. 04/07/2022: The right foot wound remains closed. The left wound is the smallest that I have seen it to date. It is narrower and shorter. It still continues to accumulate slough on the surface. 04/15/2022: There is a band of epithelium now dividing the small left plantar foot wound in 2. There is still some slough on the surface. 04/21/2022: The wound continues to narrow. Just a  little bit of slough on the surface. He seems to be responding well to endoform. [name], [name] ([id number]) [id number] Page 12 of 17 04/28/2022: Continued slow contraction of the wound. There is a little slough on the surface and some periwound callus. We have been using endoform and total contact cast. 05/05/2022: The wound appears to have stalled. There is slough and some periwound eschar/callus. No concern for infection, however. 05/12/2022: Unfortunately, his right foot has reopened. It is located at the most posterior aspect of his surgical incision. The area was noted to have drainage coming from it when his padding was removed today. Underneath some callus and senescent skin, there is an opening. No purulent drainage or malodor. On the left foot, the wound is again unchanged. There is some light blue staining on the callus, but no malodor or purulent drainage. 10/13; right and left heel remanence of extensive plantar foot wounds. These are better than I remember by quite a big margin however he is still left with wounds on the left plantar heel and the right plantar heel. Been using endoform bilaterally. A culture was done that showed apparently Pseudomonas but we are still waiting for the [facility] antibiotic to use gentamicin today. He is still very active by description I am not sure about the offloading of his noncasted right foot 10/20; both wounds right and left heel debrided not much change from last week. [name] Echevaria has arrived which is linezolid, gentamicin and ciprofloxacin we will use this with endoform. T contact cast on the left otal 06/02/2022: Both wounds are smaller today. There is still a fair amount of callus buildup around the right foot ulcer. The left is more superficial and nearly flush with the surrounding tissues. Also with slough and eschar buildup. 06/10/2022: The right sided wound appears to be nearly closed, if not completely so, although it  is somewhat difficult to tell given the abnormal tissue and  scarring in his foot. There is a fair amount of callus and crust accumulation. On the left, the wound looks about the same, again with callus and slough. He has an appointment next Thursday with Dr. Annamary Rummage in podiatry; I am hopeful that there may be some reconstructive options available for Mr. Fipps. 06/16/2022: Both wounds have some eschar and callus accumulation. The right sided wound is extremely narrow and barely open; the left is narrower than last week. There is a little bit of slough. He has his appointment in podiatry later today. 06/23/2022: The patient met with Dr. Annamary Rummage last week and unfortunately, there are no reconstructive options that he believes would be helpful. He did order an MRI to evaluate for osteomyelitis and fortunately, none was seen. The left sided wound is a little bit shorter and narrower today. The right sided wound is about the same. There is callus and eschar accumulation bilaterally. 06/29/2022: Both feet have improved from last week. There is epithelium making a valiant effort to creep across the surface on the left. The right side looks like it got a little dry and the deep crevasse in his midfoot has cracked. Both have eschar and there is some slough on the left. 07/07/2022: Both feet have improved. There is epithelium completely covering the calcaneus on the right with just a small opening in the crevasse in his midfoot. On the left, the open area of tissue is smaller but he continues to build up callus/eschar and slough. 07/15/2022: The opening in the midfoot on the right is about the same size, covered with eschar and a little bit of slough. The open portion of the left wound is narrower and shorter with a bit of slough buildup. He admits to being on his feet more than recommended. 12/14; as far as I can tell everything on the right foot is closed. There is some eschar I removed some of this I  cannot identify any open wound here. As usual this will be a very vulnerable area going forward. On the left this looks really quite healthy. I was pleasantly surprised to see how good this looked. Wound is certainly smaller and there appears to be healthy epithelialization. He has been using Promogran on the right and endoform on the left. He has been offloading the right foot with a heel offloading boot and he has a running shoe on the right foot 08/04/2022: The right foot remains closed. He has a thick cushioned insole in his sneaker. The left sided wound is smaller with just some slough and eschar accumulation. He is wearing the heel off loader on this foot. 08/15/2022: The right foot remains closed. The left wound has narrowed further. There is some slough and eschar accumulation. 08/25/2022: We put him in a peg assist shoe insert and as a result, he has more epithelialization of the ulcer with minimal slough and eschar accumulation. 09/01/2022: The wound is smaller by about half this week. Still with some slough on the surface. The peg assist shoe seems to be doing a remarkable job of adequately offloading the site. 09/08/2022: There is a little bit more epithelium coming in. There is some slough and callus buildup. 09/16/2022: The wound measures about the same size, but the epithelium that has grown in looks more robust and stronger. There is some slough and callus buildup. 09/23/2022: The wound remains about the same size. The skin edges are looking rather senescent. 09/29/2022: The aggressive debridement I performed last week seems to have been effective.  The wound is smaller and has significantly less slough accumulation. 10/06/2022: There has been quite a bit of epithelialization since last week. There are still some open areas with slough accumulation. There is some callus buildup around the perimeter. 10/13/2022: Continued improvement. Minimal callus accumulation. 3/14; patient presents for  follow-up. He has been using endoform to the wound bed without issues. He is using a surgical shoe with peg assist for offloading. 10/27/2022: The wound dimensions are stable. There is some senescent skin accumulation around the perimeter. 11/04/2022: Last week I performed a very aggressive debridement in an effort to stimulate the healing cascade. As has been the case when I have done this before, the wound has responded well. There has been epithelialization and contraction of the wound. There are just a couple of small open areas. 11/10/2022: Unfortunately, his foot got wet secondary to sweating and there has been some breakdown of the tissue, particularly the skin just adjacent to the main ulcer. 11/18/2022: We continue to struggle with moisture-related tissue breakdown around the perimeter of his wound. This has caused the thin epithelium that had formed on the surface to disintegrate. The underlying surface of the wound has good granulation tissue. 11/25/2022: The edges of the wound are much less macerated, but the surface is actually looking a little bit dry. There is slough accumulation. No obvious signs of infection. 12/01/2022: The wound edges are more macerated and broken down, making the wound larger. The surface has some slough on it. 12/08/2020: The wound looks better this week. There is significantly less maceration and moisture-related tissue breakdown. There is some slough on the wound surface. [name], [name] ([id number]) [id number] Page 13 of 17 12/15/2022: The wound continues to improve. There is almost no moisture-related tissue breakdown. There is epithelium beginning to fill in again from the edges. Light slough on the wound surface. 12/22/2022: More epithelium has filled him from around the edges. There is some slough on the surface. No evidence of moisture-related tissue breakdown. 01/05/2023: There has been more epithelialization, particularly at the  posterior calcaneal aspect of the wound. There has been a little bit of moisture accumulation with minimal maceration. 01/12/2023: More epithelium has filled in. There is very minimal accumulation of slough. 01/20/2023: The wound is measuring a little bit smaller today. It did measure deeper, but this appears to be secondary to some heaped up senescent skin around the edges. The epithelium that has been present at the posterior aspect of the wound seems to be a bit more robust with greater integrity. 01/26/2023: He reports intense itching to the skin around the wound and there has been some breakdown here. It looks fungal in nature. The wound itself looks pretty good with improved epithelialization with just some slough buildup. He has a little callus along the wound edges. 02/02/2023: He has been treating the periwound skin with an over-the-counter athlete's foot medication and notes significant improvement in his pruritus. The skin looks better here, as well. The wound continues to epithelialize. There is light slough accumulation. 02/10/2023: The periwound skin still looks fairly angry. He reports multiple small blisters that have opened up. He is not having the intense itching that he had prior, however. There has been more epithelialization at the calcaneus and there is minimal slough accumulation. 02/16/2023: The periwound skin is improving. An online review of dermatology rash images suggest that this may be hand-foot-and-mouth disease, isolated to the feet; the patient says he actually had this as a child. The calcaneus continues to epithelialize  and the tissue was quite robust here. Very minimal slough accumulation on the open portion of the wound with a little bit of periwound callus buildup. 03/03/2023: The wound measurements are about the same, but there is more epithelium making its way to the surface. There is a little bit of crust and eschar around the edges and minimal slough on the  surface. 03/09/2023: His dressing was applied rather haphazardly and the drawtex was not applied. The Optifoam was also reversed such that the orange side was in contact with his bare skin. He has some maceration around the wound edges, but this did not appear to result in any tissue breakdown. There is a little bit of slough on the surface and minimal eschar around the edges. 03/15/2023: The wound measured narrower today. There is some slough accumulation on the surface and some eschar around the edges. 03/23/2023: The length of the wound remains stable, but it continues to narrow. The skin that has covered the posterior aspect of the calcaneus is more robust and has better tensile integrity. Minimal slough and eschar accumulation. Patient History Information obtained from Patient. Family History Cancer - Maternal Grandparents, Diabetes - Father,Paternal Grandparents, Heart Disease - Maternal Grandparents, Hypertension - Father,Paternal Grandparents, Lung Disease - Siblings, No family history of Hereditary Spherocytosis, Kidney Disease, Seizures, Stroke, Thyroid Problems, Tuberculosis. Social History Never smoker, Marital Status - Married, Alcohol Use - Never, Drug Use - No History, Caffeine Use - Daily - tea, soda. Medical History Eyes Denies history of Cataracts, Glaucoma, Optic Neuritis Ear/Nose/Mouth/Throat Denies history of Chronic sinus problems/congestion, Middle ear problems Hematologic/Lymphatic Denies history of Anemia, Hemophilia, Human Immunodeficiency Virus, Lymphedema, Sickle Cell Disease Respiratory Patient has history of Asthma Denies history of Aspiration, Chronic Obstructive Pulmonary Disease (COPD), Pneumothorax, Sleep Apnea, Tuberculosis Cardiovascular Patient has history of Angina - with COVID, Hypertension Denies history of Arrhythmia, Congestive Heart Failure, Coronary Artery Disease, Deep Vein Thrombosis, Hypotension, Myocardial Infarction, Peripheral Arterial Disease,  Peripheral Venous Disease, Phlebitis, Vasculitis Gastrointestinal Denies history of Cirrhosis , Colitis, Crohns, Hepatitis A, Hepatitis B, Hepatitis C Endocrine Denies history of Type I Diabetes, Type II Diabetes Genitourinary Denies history of End Stage Renal Disease Immunological Denies history of Lupus Erythematosus, Raynauds, Scleroderma Integumentary (Skin) Denies history of History of Burn Musculoskeletal Denies history of Gout, Rheumatoid Arthritis, Osteoarthritis, Osteomyelitis Neurologic Denies history of Dementia, Neuropathy, Quadriplegia, Paraplegia, Seizure Disorder Oncologic Denies history of Received Chemotherapy, Received Radiation Psychiatric Denies history of Anorexia/bulimia, Confinement Anxiety Hospitalization/Surgery History - COVID PNA 07/22/2019- 11/14/2019. - 03/27/2020 wound debridement/ skin graft. Medical A Surgical History Notes nd Constitutional Symptoms (General Health) COVID PNA 07/22/2019-11/14/2019 VENT ECMO, foot drop left foot , Genitourinary kidney stone Psychiatric anxiety [name], [name] ([id number]) [id number] Page 14 of 17 Objective Constitutional Hypertensive, asymptomatic. no acute distress. Vitals Time Taken: 9:30 AM, Height: 69 in, Weight: 280 lbs, BMI: 41.3, Temperature: 99.1 F, Pulse: 83 bpm, Respiratory Rate: 18 breaths/min, Blood Pressure: 152/90 mmHg. Respiratory Normal work of breathing on room air. General Notes: 03/23/2023: The length of the wound remains stable, but it continues to narrow. The skin that has covered the posterior aspect of the calcaneus is more robust and has better tensile integrity. Minimal slough and eschar accumulation. Integumentary (Hair, Skin) Wound #2 status is Open. Original cause of wound was Pressure Injury. The date acquired was: 10/07/2019. The wound has been in treatment 140 weeks. The wound is located on the Left Calcaneus. The wound measures 4.3cm length x 1.5cm width x  0.2cm depth; 5.066cm^2 area and  1.013cm^3 volume. There is Fat Layer (Subcutaneous Tissue) exposed. There is no tunneling or undermining noted. There is a medium amount of serosanguineous drainage noted. The wound margin is distinct with the outline attached to the wound base. There is large (67-100%) pink granulation within the wound bed. There is a small (1-33%) amount of necrotic tissue within the wound bed including Adherent Slough. The periwound skin appearance had no abnormalities noted for moisture. The periwound skin appearance exhibited: Scarring. The periwound skin appearance did not exhibit: Callus, Crepitus, Excoriation, Induration, Rash, Atrophie Blanche, Cyanosis, Ecchymosis, Hemosiderin Staining, Mottled, Pallor, Rubor, Erythema. Periwound temperature was noted as No Abnormality. The periwound has tenderness on palpation. Assessment Active Problems ICD-10 Non-pressure chronic ulcer of other part of left foot with fat layer exposed Procedures Wound #2 Pre-procedure diagnosis of Wound #2 is a Pressure Ulcer located on the Left Calcaneus . There was a Selective/Open Wound Non-Viable Tissue Debridement with a total area of 1.69 sq cm performed by [name], MD. With the following instrument(s): Curette to remove Viable and Non-Viable tissue/material. Material removed includes [facility] after achieving pain control using Lidocaine 4% Topical Solution. No specimens were taken. A time out was conducted at 09:50, prior to the start of the procedure. A Minimum amount of bleeding was controlled with Pressure. The procedure was tolerated well with a pain level of 0 throughout and a pain level of 0 following the procedure. Post Debridement Measurements: 4.3cm length x 0.5cm width x 0.2cm depth; 0.338cm^3 volume. Post debridement Stage noted as Category/Stage III. Character of Wound/Ulcer Post Debridement is improved. Post procedure Diagnosis Wound #2: Same as Pre-Procedure General Notes:  Scribed for Dr Lady Gary by Brenton Grills RN. Plan Follow-up Appointments: Return Appointment in 1 week. - Dr. Lady Gary Room 3 Anesthetic: Wound #2 Left Calcaneus: (In clinic) Topical Lidocaine 4% applied to wound bed - In clinic Bathing/ Shower/ Hygiene: May shower and wash wound with soap and water. Edema Control - Lymphedema / SCD / Other: Avoid standing for long periods of time. Moisturize legs daily. - as needed Off-Loading: Other: - keep pressure off of the bottom of your feet. Elevate legs throughout the day. Use the Shoe with the PegAssist off-loading insole Additional Orders / Instructions: Follow Nutritious Diet - Try to get 70-100 grams of Protein a day+ WOUND #2: - Calcaneus Wound Laterality: Left Cleanser: Normal Saline (Generic) 1 x Per Day/30 Days HAWKINS, DUYCK ([id number]) [id number] Page 15 of 17 Discharge Instructions: Cleanse the wound with Normal Saline prior to applying a clean dressing using gauze sponges, not tissue or cotton balls. Cleanser: Wound Cleanser (Generic) 1 x Per Day/30 Days Discharge Instructions: Cleanse the wound with wound cleanser prior to applying a clean dressing using gauze sponges, not tissue or cotton balls. Peri-Wound Care: Ketoconazole Cream 2% 1 x Per Day/30 Days Discharge Instructions: Apply Ketoconazole mixed with zinc to the periwound Peri-Wound Care: Zinc Oxide Ointment 30g tube 1 x Per Day/30 Days Discharge Instructions: Apply Zinc Oxide to periwound with each dressing change Topical: Gentamicin (Generic) 1 x Per Day/30 Days Discharge Instructions: apply to the wound bed Prim Dressing: Maxorb Extra Ag+ Alginate Dressing, 4x4.75 (in/in) (Generic) 1 x Per Day/30 Days ary Discharge Instructions: Apply to wound bed as instructed Secondary Dressing: Drawtex 4x4 in (Generic) 1 x Per Day/30 Days Discharge Instructions: Apply over primary dressing as directed. Secondary Dressing: Optifoam Non-Adhesive Dressing, 4x4  in (Generic) 1 x Per Day/30 Days Discharge Instructions: Apply over primary dressing as directed. Secondary Dressing: Woven Gauze Sponge,  Non-Sterile 4x4 in (Generic) 1 x Per Day/30 Days Discharge Instructions: Apply over primary dressing as directed. Secured With: 28M Medipore H Soft Cloth Surgical T ape, 4 x 10 (in/yd) (Generic) 1 x Per Day/30 Days Discharge Instructions: Secure with tape as directed. Com pression Wrap: Kerlix Roll 4.5x3.1 (in/yd) (Generic) 1 x Per Day/30 Days Discharge Instructions: Apply Kerlix and Coban compression as directed. Add-Ons: [employer] Offloading Boot, Size Regular 1 x Per Day/30 Days 03/23/2023: The length of the wound remains stable, but it continues to narrow. The skin that has covered the posterior aspect of the calcaneus is more robust and has better tensile integrity. Minimal slough and eschar accumulation. I used a curette to debride slough and eschar from the wound. We will continue topical gentamicin, silver alginate, and drawtex with periwound ketoconazole and zinc oxide. Continue peg assist offloading insert. Follow-up in 1 week. Electronic Signature(s) Signed: 03/23/2023 10:00:59 AM By: [name] MD FACS Entered By: [name] on 03/23/2023 10:00:59 -------------------------------------------------------------------------------- HxROS Details Patient Name: Date of Service: [name], [name] 03/23/2023 9:30 A M Medical Record Number: [id number] Patient Account Number: [id number] Date of Birth/Sex: Treating RN: [birth year] (47 y.o. M) Primary Care Provider: Dorinda Hill Other Clinician: Referring Provider: Treating Provider/Extender: [name] in Treatment: 140 Information Obtained From Patient Constitutional Symptoms (General Health) Medical History: Past Medical History Notes: COVID PNA 07/22/2019-11/14/2019 VENT ECMO, foot drop left foot , Eyes Medical History: Negative for: Cataracts; Glaucoma; Optic  Neuritis Ear/Nose/Mouth/Throat Medical History: Negative for: Chronic sinus problems/congestion; Middle ear problems Hematologic/Lymphatic Medical History: Negative for: Anemia; Hemophilia; Human Immunodeficiency Virus; Lymphedema; Sickle Cell Disease Respiratory KAULANA, BUNKER ([id number]) [id number] Page 16 of 17 Medical History: Positive for: Asthma Negative for: Aspiration; Chronic Obstructive Pulmonary Disease (COPD); Pneumothorax; Sleep Apnea; Tuberculosis Cardiovascular Medical History: Positive for: Angina - with COVID; Hypertension Negative for: Arrhythmia; Congestive Heart Failure; Coronary Artery Disease; Deep Vein Thrombosis; Hypotension; Myocardial Infarction; Peripheral Arterial Disease; Peripheral Venous Disease; Phlebitis; Vasculitis Gastrointestinal Medical History: Negative for: Cirrhosis ; Colitis; Crohns; Hepatitis A; Hepatitis B; Hepatitis C Endocrine Medical History: Negative for: Type I Diabetes; Type II Diabetes Genitourinary Medical History: Negative for: End Stage Renal Disease Past Medical History Notes: kidney stone Immunological Medical History: Negative for: Lupus Erythematosus; Raynauds; Scleroderma Integumentary (Skin) Medical History: Negative for: History of Burn Musculoskeletal Medical History: Negative for: Gout; Rheumatoid Arthritis; Osteoarthritis; Osteomyelitis Neurologic Medical History: Negative for: Dementia; Neuropathy; Quadriplegia; Paraplegia; Seizure Disorder Oncologic Medical History: Negative for: Received Chemotherapy; Received Radiation Psychiatric Medical History: Negative for: Anorexia/bulimia; Confinement Anxiety Past Medical History Notes: anxiety Immunizations Pneumococcal Vaccine: Received Pneumococcal Vaccination: No Implantable Devices None Hospitalization / Surgery History Type of Hospitalization/Surgery COVID PNA 07/22/2019- 11/14/2019 03/27/2020 wound debridement/ skin  graft Family and Social History Cancer: Yes - Maternal Grandparents; Diabetes: Yes - Father,Paternal Grandparents; Heart Disease: Yes - Maternal Grandparents; Hereditary Spherocytosis: No; Hypertension: Yes - Father,Paternal Grandparents; Kidney Disease: No; Lung Disease: Yes - Siblings; Seizures: No; Stroke: No; Thyroid Problems: No; Tuberculosis: No; Never smoker; Marital Status - Married; Alcohol Use: Never; Drug Use: No History; Caffeine Use: Daily - tea, soda; Financial Concerns: No; Food, Clothing or Shelter Needs: No; Support System Lacking: No; Transportation Concerns: No DAVINDER, MIGNEAULT ([id number]) [id number] Page 17 of 17 Electronic Signature(s) Signed: 03/23/2023 10:16:35 AM By: [name] MD FACS Entered By: [name] on 03/23/2023 09:57:45 -------------------------------------------------------------------------------- SuperBill Details Patient Name: Date of Service: [name], [name] 03/23/2023 Medical Record Number: [id number] Patient Account Number: [id number] Date  of Birth/Sex: Treating RN: 1976/03/23 (47 y.o. [name] Primary Care Provider: Dorinda Hill Other Clinician: Referring Provider: Treating Provider/Extender: [name] in Treatment: 140 Diagnosis Coding ICD-10 Codes Code Description [phone] Non-pressure chronic ulcer of other part of left foot with fat layer exposed Facility Procedures : CPT4 Code: [id number] Description: [phone] - DEBRIDE WOUND 1ST 20 SQ CM OR < ICD-10 Diagnosis Description L97.522 Non-pressure chronic ulcer of other part of left foot with fat layer exposed Modifier: Quantity: 1 Physician Procedures : CPT4 Code Description Modifier [id number] - WC PHYS LEVEL 4 - EST PT 25 ICD-10 Diagnosis Description L97.522 Non-pressure chronic ulcer of other part of left foot with fat layer exposed Quantity: 1 : [id number] - WC PHYS DEBR WO ANESTH 20 SQ CM ICD-10 Diagnosis  Description L97.522 Non-pressure chronic ulcer of other part of left foot with fat layer exposed Quantity: 1 Electronic Signature(s) Signed: 03/23/2023 10:01:11 AM By: [name] MD FACS Entered By: [name] on 03/23/2023 10:01:11

## 2023-04-04 NOTE — Progress Notes (Signed)
[name] ([id number]) [id number] Page 1 of 7 Visit Report for 03/23/2023 Arrival Information Details Patient Name: Date of Service: [name], [name] [name] 03/23/2023 9:30 A M Medical Record Number: [id number] Patient Account Number: [id number] Date of Birth/Sex: Treating RN: 1976-06-09 (47 y.o. [name] Primary Care [name]: [name] Other Clinician: Referring [name]: Treating [name]/Extender: [name] in Treatment: 140 Visit Information History Since Last Visit All ordered tests and consults were completed: Yes Patient Arrived: Wheel Chair Added or deleted any medications: No Arrival Time: 09:33 Any new allergies or adverse reactions: No Accompanied By: son Had a fall or experienced change in No Transfer Assistance: None activities of daily living that may affect Patient Requires Transmission-Based Precautions: No risk of falls: Patient Has Alerts: No Signs or symptoms of abuse/neglect since last visito No Hospitalized since last visit: No Has Dressing in Place as Prescribed: Yes Pain Present Now: No Electronic Signature(s) Signed: 04/04/2023 2:03:55 PM By: [name] Entered By: [name] on 03/23/2023 09:33:41 -------------------------------------------------------------------------------- Encounter Discharge Information Details Patient Name: Date of Service: [name], [name] [place] [name] 03/23/2023 9:30 A M Medical Record Number: [id number] Patient Account Number: [id number] Date of Birth/Sex: Treating RN: Sep 03, 1975 (47 y.o. [name] Primary Care [name]: [name] Other Clinician: Referring [name]: Treating [name]/Extender: [name] in Treatment: 140 Encounter Discharge Information Items Post Procedure Vitals Discharge Condition: Stable Temperature (F): 99.1 Ambulatory Status: Wheelchair Pulse (bpm): 78 Discharge Destination: Home Respiratory Rate  (breaths/min): 18 Transportation: Private Auto Blood Pressure (mmHg): 136/78 Accompanied By: son Schedule Follow-up Appointment: Yes Clinical Summary of Care: Patient Declined Electronic Signature(s) Signed: 04/04/2023 2:03:55 PM By: [name] Entered By: [name] on 03/23/2023 10:04:27 [name] ([id number]) [id number] Page 2 of 7 -------------------------------------------------------------------------------- Lower Extremity Assessment Details Patient Name: Date of Service: [name] [name] 03/23/2023 9:30 A M Medical Record Number: [id number] Patient Account Number: [id number] Date of Birth/Sex: Treating RN: 03-11-1976 (47 y.o. [name] Primary Care Joseguadalupe Stan: [name] Other Clinician: Referring [name]: Treating [name]/Extender: [name] in Treatment: 140 Edema Assessment Assessed: [name]: No] [name]: No] Edema: [Left: N] [Right: o] Calf Left: Right: Point of Measurement: 29 cm From Medial Instep 43.5 cm Ankle Left: Right: Point of Measurement: 9 cm From Medial Instep 24.4 cm Vascular Assessment Pulses: Dorsalis Pedis Palpable: [Left:Yes] Extremity colors, hair growth, and conditions: Extremity Color: [Left:Normal] Hair Growth on Extremity: [Left:Yes] Temperature of Extremity: [Left:Warm] Capillary Refill: [Left:< 3 seconds] Dependent Rubor: [Left:No No] Toe Nail Assessment Left: Right: Thick: No Discolored: No Deformed: No Improper Length and Hygiene: No Electronic Signature(s) Signed: 04/04/2023 2:03:55 PM By: [name] Entered By: [name] on 03/23/2023 09:38:29 -------------------------------------------------------------------------------- Multi Wound Chart Details Patient Name: Date of Service: [name], [name] [place] [name] 03/23/2023 9:30 A M Medical Record Number: [id number] Patient Account Number: [id number] Date of Birth/Sex: Treating RN: 05/08/1976 (47 y.o. M) Primary  Care [name]: [name] Other Clinician: Referring [name]: Treating [name]/Extender: [name] in Treatment: 140 Vital Signs Height(in): 69 Pulse(bpm): 83 Weight(lbs): 280 Blood Pressure(mmHg): 152/90 Body Mass Index(BMI): 41.3 Temperature(F): 99.1 Respiratory Rate(breaths/min): 18 [name] ([id number]) [2:Photos:] [N/A:N/A] Left Calcaneus N/A N/A Wound Location: Pressure Injury N/A N/A Wounding Event: Pressure Ulcer N/A N/A Primary Etiology: Asthma, Angina, Hypertension N/A N/A Comorbid History: 10/07/2019 N/A N/A Date Acquired: 140 N/A N/A Weeks of Treatment: Open N/A N/A Wound Status: No N/A N/A Wound Recurrence: 4.3x1.5x0.2 N/A N/A Measurements L x  W x D (cm) 5.066 N/A N/A A (cm) : rea 1.013 N/A N/A Volume (cm) : 81.30% N/A N/A % Reduction in A rea: 96.30% N/A N/A % Reduction in Volume: Category/Stage III N/A N/A Classification: Medium N/A N/A Exudate A mount: Serosanguineous N/A N/A Exudate Type: red, brown N/A N/A Exudate Color: Distinct, outline attached N/A N/A Wound Margin: Large (67-100%) N/A N/A Granulation A mount: Pink N/A N/A Granulation Quality: Small (1-33%) N/A N/A Necrotic A mount: Fat Layer (Subcutaneous Tissue): Yes N/A N/A Exposed Structures: Fascia: No Tendon: No Muscle: No Joint: No Bone: No Small (1-33%) N/A N/A Epithelialization: Debridement - Selective/Open Wound N/A N/A Debridement: Pre-procedure Verification/Time Out 09:50 N/A N/A Taken: Lidocaine 4% Topical Solution N/A N/A Pain Control: Slough N/A N/A Tissue Debrided: Non-Viable Tissue N/A N/A Level: 1.69 N/A N/A Debridement A (sq cm): rea Curette N/A N/A Instrument: Minimum N/A N/A Bleeding: Pressure N/A N/A Hemostasis A chieved: 0 N/A N/A Procedural Pain: 0 N/A N/A Post Procedural Pain: Procedure was tolerated well N/A N/A Debridement Treatment Response: 4.3x0.5x0.2 N/A N/A Post Debridement Measurements L  x W x D (cm) 0.338 N/A N/A Post Debridement Volume: (cm) Category/Stage III N/A N/A Post Debridement Stage: Scarring: Yes N/A N/A Periwound Skin Texture: Excoriation: No Induration: No Callus: No Crepitus: No Rash: No Dry/Scaly: Yes N/A N/A Periwound Skin Moisture: Maceration: No Atrophie Blanche: No N/A N/A Periwound Skin Color: Cyanosis: No Ecchymosis: No Erythema: No Hemosiderin Staining: No Mottled: No Pallor: No Rubor: No No Abnormality N/A N/A Temperature: Yes N/A N/A Tenderness on Palpation: Debridement N/A N/A Procedures Performed: Treatment Notes Electronic Signature(s) Signed: 03/23/2023 9:54:50 AM By: [name] MD FACS Entered By: [name] on 03/23/2023 09:54:50 [name] ([id number]) [id number] Page 4 of 7 -------------------------------------------------------------------------------- Multi-Disciplinary Care Plan Details Patient Name: Date of Service: [name] [name] 03/23/2023 9:30 A M Medical Record Number: [id number] Patient Account Number: [id number] Date of Birth/Sex: Treating RN: 08-Feb-1976 (47 y.o. [name] Primary Care Anie Juniel: [name] Other Clinician: Referring Zariah Jost: Treating Jessyka [place]/Extender: [name] in Treatment: 140 Multidisciplinary Care Plan reviewed with physician Active Inactive Wound/Skin Impairment Nursing Diagnoses: Impaired tissue integrity Knowledge deficit related [name] ulceration/compromised skin integrity Goals: Patient/caregiver will verbalize understanding of skin care regimen Date Initiated: 07/15/2020 Target Resolution Date: 05/08/2023 Goal Status: Active Ulcer/skin breakdown will have a volume reduction of 30% by week 4 Date Initiated: 07/15/2020 Date Inactivated: 08/20/2020 Target Resolution Date: 09/03/2020 Goal Status: Unmet Unmet Reason: no major changes. Ulcer/skin breakdown will heal within 14 weeks Date Initiated:  12/04/2020 Date Inactivated: 12/10/2020 Target Resolution Date: 12/10/2020 Unmet Reason: wounds still open at 14 Goal Status: Unmet weeks and today 21 weeks. Interventions: Assess patient/caregiver ability [name] obtain necessary supplies Assess patient/caregiver ability [name] perform ulcer/skin care regimen upon admission and as needed Assess ulceration(s) every visit Provide education on ulcer and skin care Treatment Activities: Skin care regimen initiated : 07/15/2020 Topical wound management initiated : 07/15/2020 Notes: Electronic Signature(s) Signed: 04/04/2023 2:03:55 PM By: [name] Entered By: [name] on 03/23/2023 09:40:07 -------------------------------------------------------------------------------- Pain Assessment Details Patient Name: Date of Service: [name], [name] [place] [name] 03/23/2023 9:30 A M Medical Record Number: [id number] Patient Account Number: [id number] Date of Birth/Sex: Treating RN: [birth year] (47 y.o. [name] Primary Care Zein Helbing: [name] Other Clinician: Referring Shontia Gillooly: Treating Cabria Micalizzi/Extender: [name] in Treatment: 140 Active Problems Location of Pain Severity and Description of Pain Patient Has Paino No ELAINE, BASORE ([id number]) [id number] Page 5  of 7 Patient Has Paino No Site Locations Pain Management and Medication Current Pain Management: Electronic Signature(s) Signed: 04/04/2023 2:03:55 PM By: [name] Entered By: [name] on 03/23/2023 09:34:31 -------------------------------------------------------------------------------- Patient/Caregiver Education Details Patient Name: Date of Service: [name], [name] [place] 8/15/2024andnbsp9:30 A M Medical Record Number: [id number] Patient Account Number: [id number] Date of Birth/Gender: Treating RN: 11-Dec-1975 (47 y.o. [name] Primary Care Physician: [name] Other Clinician: Referring Physician: Treating  Physician/Extender: [name] in Treatment: 140 Education Assessment Education Provided [name]: Patient and Caregiver Education Topics Provided Wound/Skin Impairment: Methods: Explain/Verbal Responses: State content correctly Electronic Signature(s) Signed: 04/04/2023 2:03:55 PM By: [name] Entered By: [name] on 03/23/2023 09:40:29 -------------------------------------------------------------------------------- Wound Assessment Details Patient Name: Date of Service: [name], [name] [name] 03/23/2023 9:30 A M Medical Record Number: [id number] Patient Account Number: [id number] Date of Birth/Sex: Treating RN: Aug 27, 1975 (47 y.o. Jerediah, Luecht, Henrietta Dine ([id number]) [id number] Page 6 of 7 Primary Care Joanann Mies: [name] Other Clinician: Referring Roen Macgowan: Treating Atharv Barriere/Extender: [name] in Treatment: 140 Wound Status Wound Number: 2 Primary Etiology: Pressure Ulcer Wound Location: Left Calcaneus Wound Status: Open Wounding Event: Pressure Injury Comorbid History: Asthma, Angina, Hypertension Date Acquired: 10/07/2019 Weeks Of Treatment: 140 Clustered Wound: No Photos Wound Measurements Length: (cm) 4.3 Width: (cm) 1.5 Depth: (cm) 0.2 Area: (cm) 5.066 Volume: (cm) 1.013 % Reduction in Area: 81.3% % Reduction in Volume: 96.3% Epithelialization: Small (1-33%) Tunneling: No Undermining: No Wound Description Classification: Category/Stage III Wound Margin: Distinct, outline attached Exudate Amount: Medium Exudate Type: Serosanguineous Exudate Color: red, brown Foul Odor After Cleansing: No Slough/Fibrino No Wound Bed Granulation Amount: Large (67-100%) Exposed Structure Granulation Quality: Pink Fascia Exposed: No Necrotic Amount: Small (1-33%) Fat Layer (Subcutaneous Tissue) Exposed: Yes Necrotic Quality: Adherent Slough Tendon Exposed: No Muscle Exposed: No Joint  Exposed: No Bone Exposed: No Periwound Skin Texture Texture Color No Abnormalities Noted: No No Abnormalities Noted: No Callus: No Atrophie Blanche: No Crepitus: No Cyanosis: No Excoriation: No Ecchymosis: No Induration: No Erythema: No Rash: No Hemosiderin Staining: No Scarring: Yes Mottled: No Pallor: No Moisture Rubor: No No Abnormalities Noted: Yes Temperature / Pain Temperature: No Abnormality Tenderness on Palpation: Yes Electronic Signature(s) Signed: 04/04/2023 2:03:55 PM By: [name] Entered By: [name] on 03/23/2023 09:42:12 [name] ([id number]) [id number] Page 7 of 7 -------------------------------------------------------------------------------- Vitals Details Patient Name: Date of Service: [name], [name] [name] 03/23/2023 9:30 A M Medical Record Number: [id number] Patient Account Number: [id number] Date of Birth/Sex: Treating RN: February 21, 1976 (47 y.o. [name] Primary Care Kemi Gell: [name] Other Clinician: Referring Ramar Nobrega: Treating Toluwani Yadav/Extender: [name] in Treatment: 140 Vital Signs Time Taken: 09:30 Temperature (F): 99.1 Height (in): 69 Pulse (bpm): 83 Weight (lbs): 280 Respiratory Rate (breaths/min): 18 Body Mass Index (BMI): 41.3 Blood Pressure (mmHg): 152/90 Reference Range: 80 - 120 mg / dl Electronic Signature(s) Signed: 04/04/2023 2:03:55 PM By: [name] Entered By: [name] on 03/23/2023 09:34:16

## 2023-04-04 NOTE — Progress Notes (Signed)
[name], [name] ([id number]) [id number] Page 1 of 17 Visit Report for 03/15/2023 Chief Complaint Document Details Patient Name: Date of Service: [name], TO [name] 03/15/2023 9:30 A M Medical Record Number: [id number] Patient Account Number: [id number] Date of Birth/Sex: Treating RN: 26-Nov-1975 (47 y.o. M) Primary Care Provider: Dorinda Hill Other Clinician: Referring Provider: Treating Provider/Extender: Jana Hakim in Treatment: 139 Information Obtained from: Patient Chief Complaint Bilateral Plantar Foot Ulcers Electronic Signature(s) Signed: 03/15/2023 9:57:01 AM By: [name] MD FACS Entered By: [name] on 03/15/2023 09:57:00 -------------------------------------------------------------------------------- Debridement Details Patient Name: Date of Service: [name], [name] 03/15/2023 9:30 A M Medical Record Number: [id number] Patient Account Number: [id number] Date of Birth/Sex: Treating RN: [birth year] (47 y.o. [name] Primary Care Provider: Dorinda Hill Other Clinician: Referring Provider: Treating Provider/Extender: Jana Hakim in Treatment: 139 Debridement Performed for Assessment: Wound #2 Left Calcaneus Performed By: Physician [name], MD Debridement Type: Debridement Level of Consciousness (Pre-procedure): Awake and Alert Pre-procedure Verification/Time Out Yes - 09:50 Taken: Start Time: 09:51 Pain Control: Lidocaine 4% Topical Solution Percent of Wound Bed Debrided: 100% T Area Debrided (cm): otal 5.3 Tissue and other material debrided: Non-Viable, Callus, Slough, Slough Level: Non-Viable Tissue Debridement Description: Selective/Open Wound Instrument: Curette Bleeding: Minimum Hemostasis Achieved: Pressure End Time: 09:55 Procedural Pain: 0 Post Procedural Pain: 0 Response to Treatment: Procedure was tolerated well Level of Consciousness (Post- Awake and  Alert procedure): Post Debridement Measurements of Total Wound Length: (cm) 4.5 Stage: Category/Stage III Width: (cm) 1.5 Depth: (cm) 0.2 Volume: (cm) 1.06 Character of Wound/Ulcer Post Debridement: Improved [name], [name] ([id number]) [id number] Page 2 of 17 Post Procedure Diagnosis Same as Pre-procedure Notes Scribed for Dr Lady Gary by Brenton Grills RN. Electronic Signature(s) Signed: 03/15/2023 10:31:50 AM By: [name] MD FACS Signed: 04/04/2023 2:03:55 PM By: Brenton Grills Entered By: Brenton Grills on 03/15/2023 09:51:46 -------------------------------------------------------------------------------- HPI Details Patient Name: Date of Service: [name], [name] 03/15/2023 9:30 A M Medical Record Number: [id number] Patient Account Number: [id number] Date of Birth/Sex: Treating RN: 31-Mar-1976 (47 y.o. M) Primary Care Provider: Dorinda Hill Other Clinician: Referring Provider: Treating Provider/Extender: Jana Hakim in Treatment: 139 History of Present Illness HPI Description: Wounds are12/03/2020 upon evaluation today patient presents for initial inspection here in our clinic concerning issues he has been having with the bottoms of his feet bilaterally. He states these actually occurred as wounds when he was hospitalized for 5 months secondary to Covid. He was apparently with tilting bed where he was in an upright position quite frequently and apparently this occurred in some way shape or form during that time. Fortunately there is no sign of active infection at this time. No fevers, chills, nausea, vomiting, or diarrhea. With that being said he still has substantial wounds on the plantar aspects of his feet Theragen require quite a bit of work to get these to heal. He has been using Santyl currently though that is been problematic both in receiving the medication as well as actually paid for it as it is become quite expensive.  Prior to the experience with Covid the patient really did not have any major medical problems other than hypertension he does have some mild generalized weakness following the Covid experience. 07/22/2020 on evaluation today patient appears to be doing okay in regard to his foot ulcers I feel like the wound beds are showing signs of better improvement that I do believe the Iodoflex is helping  in this regard. With that being said he does have a lot of drainage currently and this is somewhat blue/green in nature which is consistent with Pseudomonas. I do think a culture today would be appropriate for [place] to evaluate and see if that is indeed the case I would likely start him on antibiotic orally as well he is not allergic to Cipro knows of no issues he has had in the past 12/21; patient was admitted to the clinic earlier this month with bilateral presumed pressure ulcers on the bottom of his feet apparently related to excessive pressure from a tilt table arrangement in the intensive care unit. Patient relates this to being on ECMO but I am not really sure that is exactly related to that. I must say I have never seen anything like this. He has fairly extensive full-thickness wounds extending from his heel towards his midfoot mostly centered laterally. There is already been some healing distally. He does not appear to have an arterial issue. He has been using gentamicin to the wound surfaces with Iodoflex to help with ongoing debridement 1/6; this is a patient with pressure ulcers on the bottom of his feet related to excessive pressure from a standing position in the intensive care unit. He is complaining of a lot of pain in the right heel. He is not a diabetic. 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 We have been using Iodoflex to help prepare the surfaces of both wounds for an advanced treatment product. He is nonambulatory spending most of his time in a wheelchair I have asked  him not to propel the wheelchair with his heels 1/13; in general his wounds look better not much surface area change we have been using Iodoflex as of last week. I did an x-ray of the right heel as the patient was complaining of pain. I had some thoughts about a stress fracture perhaps Achilles tendon problems however what it showed was erosive changes along the inferior aspect of the calcaneus he now has a MRI booked for 1/20. 1/20; in general his wounds continue to be better. Some improvement in the large narrow areas proximally in his foot. He is still complaining of pain in the right heel and tenderness in certain areas of this wound. His MRI is tonight. I am not just looking for osteomyelitis that was brought up on the x-ray I am wondering about stress fractures, tendon ruptures etc. He has no such findings on the left. Also noteworthy is that the patient had critical illness neuropathy and some of the discomfort may be actual improvement in nerve function I am just not sure. These wounds were initially in the setting of severe critical illness related to COVID-19. He was put in a standing position. He may have also been on pressors at the point contributing to tissue ischemia. By his description at some point these wounds were grossly necrotic extending proximally up into the Achilles part of his heel. I do not know that I have ever really seen pictures of them like this although they may exist in epic We have ordered Tri layer Oasis. I am trying to stimulate some granulation in these areas. This is of course assuming the MRI is negative for infection 1/27; since the patient was last here he saw Dr. Earlene Plater of infectious disease. He is planned for vancomycin and ceftriaxone. Prior operative culture grew MSSA. Also ordered baseline lab work. He also ordered arterial studies although the ABIs in our clinic were  normal as well as his clinical exam these were normal I do not think he needs to  see vascular surgery. His ABIs at the [facility] were 1.22 in the right triphasic waveforms with a normal TBI of 1.15 on the left ABI of 1.22 with triphasic waveforms and a normal TBI of 1.08. Finally he saw Dr. Logan Bores who will follow him in for 2 months. At this point I do not think he felt that he needed a procedure on the right calcaneal bone. Dr. Earlene Plater is elected for broad-spectrum antibiotic The patient is still having pain in the right heel. He walks with a walker 2/3; wounds are generally smaller. He is tolerating his IV antibiotics. I believe this is vancomycin and ceftriaxone. We are still waiting for Oasis burn in terms of his out-of-pocket max which he should be meeting soon given the IV antibiotics, MRIs etc. I have asked him to check in on this. We are using silver collagen in the meantime the wounds look better [name], [name] ([id number]) [id number] Page 3 of 17 2/10; tolerating IV vancomycin and Rocephin. We are waiting to apply for Oasis. Although I am not really sure where he is in his out-of-pocket max. 2/17 started the first application of Oasis trilayer. Still on antibiotics. The wounds have generally look better. The area on the left has a little more surface slough requiring debridement 2/24; second application of Oasis trilayer. The wound surface granulation is generally look better. The area on the left with undermining laterally I think is come in a bit. 10/08/2020 upon evaluation today patient is here today for [employer] application #3. Fortunately he seems to be doing extremely well with regard to this and we are seeing a lot of new epithelial growth which is great news. Fortunately there is no signs of active infection at this time. 10/16/2020 upon evaluation today patient appears to be doing well with regard to his foot ulcers. Do believe the Oasis has been of benefit for him. I do not see any signs of infection right now which is great news and  I think that he has a lot of new epithelial growth which is great to see as well. The patient is very pleased to hear all of this. I do think we can proceed with the Oasis trilayer #4 today. 3/18; not as much improvement in these areas on his heels that I was hoping. I did reapply trilateral Oasis today the tissue looks healthier but not as much fill in as I was hoping. 3/25; better looking today I think this is come in a bit the tissue looks healthier. Triple layer Oasis reapplied #6 4/1; somewhat better looking definitely better looking surface not as much change in surface area as I was hoping. He may be spending more time [place] on days then he needs to although he does have heel offloading boots. Triple layer Oasis reapplied #7 4/7; unfortunately apparently [facility] will not approve any further Oasis which is unfortunate since the patient did respond nicely both in terms of the condition of the wound bed as well as surface area. There is still some drainage coming from the wound but not a lot there does not appear to be any infection 4/15; we have been using Hydrofera Blue. He continues to have nice rims of epithelialization on the right greater than the left. The left the epithelialization is coming from the tip of his heel. There is moderate drainage. In this that concerns me about  a total contact cast. There is no evidence of infection 4/29; patient has been using Hydrofera Blue with dressing changes. He has no complaints or issues today. 5/5; using Hydrofera Blue. I actually think that he looks marginally better than the last time I saw this 3 weeks ago. There are rims of epithelialization on the left thumb coming from the medial side on the right. Using Hydrofera Blue 5/12; using Hydrofera Blue. These continue to make improvements in surface area. His drainage was not listed as severe I therefore went ahead and put a cast on the left foot. Right foot we will continue to dress  his previous 5/16; back for first total contact cast change. He did not tolerate this particularly well cast injury on the anterior tibia among other issues. Difficulty sleeping. I talked him about this in some detail and afterwards is elected to continue. I told him I would like to have a cast on for 3 weeks to see if this is going to help at all. I think he agreed 5/19; I think the wound is better. There is no tunneling towards his midfoot. The undermining medially also looks better. He has a rim of new skin distally. I think we are making progress here. The area on the left also continues to look somewhat better to me using Hydrofera Blue. He has a list of complaints about the cast but none of them seem serious 5/26; patient presents for 1 week follow-up. He has been using a total contact cast and tolerating this well. Hydrofera Blue is the main dressing used. He denies signs of infection. 6/2 Hydrofera Blue total contact cast on the left. These were large ulcers that formed in intensive care unit where the patient was recovering from COVID. May have had something to do with being ventilated in an upright positiono Pressors etc. We have been able to get the areas down considerably and a viable surface. There is some epithelialization in both sides. Note made of drainage 6/9; changed to [facility] last time because of drainage. He arrives with better looking surfaces and dimensions on the left than the right. Paradoxically the right actually probes more towards his midfoot the left is largely close down but both of these look improved. Using a total contact cast on the left 6/16; complex wounds on his bilateral plantar heels which were initially pressure injury from a stay in the ICU with COVID. We have been using silver alginate most recently. His dimensions of come in quite dramatically however not recently. We have been putting the left foot in a total contact cast 6/23; complex wounds on  the bilateral plantar heels. I been putting the left in the cast paradoxically the area on the right is the one that is going towards closure at a faster rate. Quite a bit of drainage on the left. The patient went to see Dr. Logan Bores who said he was going to standby for skin grafts. I had actually considered sending him for skin grafts however he would be mandatorily off his feet for a period of weeks to months. I am thinking that the area on the right is going to close on its own the area on the left has been more stubborn even though we have him in a total contact cast 6/30; took him out of a total contact cast last week is the right heel seem to be making better progress than the left where I was placing the cast. We are using silver alginate. Both  wounds are smaller right greater than left 7/12; both wounds look as though they are making some progress. We are using silver alginate. Heel offloading boots 7/26; very gradual progress especially on the right. Using silver alginate. He is wearing heel offloading boots 8/18; he continues to close these wounds down very gradually. Using silver alginate. The problem polymen being definitive about this is areas of what appears to be callus around the margins. This is not a 100% of the area but certainly sizable especially on the right 9/1; bilateral plantar feet wounds secondary to prolonged pressure while being ventilated for COVID-19 in an upright position. Essentially pressure ulcers on the bottom of his feet. He is made substantial progress using silver alginate. 9/14; bilateral plantar feet wounds secondary to prolonged pressure. Making progress using silver alginate. 9/29 bilateral plantar feet wounds secondary to prolonged pressure. I changed him to Iodoflex last week. MolecuLight showing reddened blush fluorescence 10/11; patient presents for follow-up. He has no issues or complaints today. He denies signs of infection. He continues to use Iodoflex  and antibiotic ointment to the wound beds. 10/27; 2-week follow-up. No evidence of infection. He has callus and thick dry skin around the wound margins we have been using Iodoflex and Bactroban which was in response to a moderate left MolecuLight reddish blush fluorescence. 11/10; 2-week follow-up. Wound margins again have thick callus however the measurements of the actual wound sites are a lot smaller. Everything looks reasonably healthy here. We have been using Iodoflex He was approved for prime matrix but I have elected to delay this given the improvement in the surface area. Hopefully I will not regret that decision as were getting close to the end of the year in terms of insurance payment 12/8; 2-week follow-up. Wounds are generally smaller in size. These were initially substantial wounds extending into the forefoot all the way into the heel on the bilateral plantar feet. They are now both located on the plantar heel distal aspect both of these have a lot of callus around the wounds I used a #5 curette to remove this on the right and the left also some subcutaneous debris to try and get the wound edges were using Iodoflex. He has heel offloading shoe 12/22; 2-week follow-up. Not really much improvement. He has thick callus around the outer edges of both wounds. I remove this there is some nonviable subcutaneous tissue as well. We have been using Iodoflex. Her intake nurse and myself spontaneously thought of a total contact cast I went back in May. At that time we really were not seeing much of an improvement with a cast although the wound was in a much different situation I would like to retry this in 2 weeks and I discussed this with the patient 08/12/2021; the patient has had some improvement with the Iodoflex. The the area on the left heel plantar more improved than the right. I had to put him in a total contact cast on the left although I decided to put that off for 2 weeks. I am going to  change his primary dressing to silver collagen. I think in both areas he has had some improvement most of the healing seems to be more proximal in the heel. The wounds are in the mid aspect. A lot of thick callus on the right heel however. 1/19; we are using silver collagen on both plantar heel areas. He has had some improvement today. The left did not require any debridement. He still had some eschar  on the right that was debrided but both seem to have contracted. I did not put it total contact cast on him today 2/2 we have been using silver collagen. The area on the right plantar heel has areas that appear to be epithelialized interspersed with dry flaking callus and dry skin. I removed this. This really looks better than on the other side. On the left still a large area with raised edges and debris on the surface. 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 He has no issues or complaints today. 2/9; not much change in the left foot wound with 1 week of a cast we are using silver collagen. Silver collagen on the right side. The right side has been the better wound surface. We will reapply the total contact cast on the left [name], [name] ([id number]) [id number] Page 4 of 17 2/16; not much improvement on either side I been using silver collagen with a total contact cast on the left. I'm changing the Hydrofera Blue still with a total contact cast on the left 2/23; some improvement on both sides. Disappointing that he has thick callus around the area that we are putting in a total contact cast on the left. We've been using Hydrofera Blue on both wound areas. This is a man who at essentially pressure ulcers in addition to ischemia caused by medications to support his blood pressure (pressors) in the ICU. He was being ventilated in the standing  position for severe Covid. A Shiley the wounds extended across his entire foot but are now localized to his plantar heels bilaterally. We have made progress however neither areas healed. I continue to think the total contact cast is helped albeit painstakingly slowly. He has never wanted a plastic surgery consult although I don't know that they would be interested in grafting in area in this location. 10/07/2021: Continued improvement bilaterally. There is still some callus around the left wound, despite the total contact cast. He has some increased pain in his right midfoot around 1 particular area. This has been painful in the past but seems to be a little bit worse. When his cast was removed today, there was an area on the heel of the left foot that looks a bit macerated. He is also complaining of pain in his left thigh and hip which he thinks is secondary to the limb length discrepancy caused by the cast. 10/14/2021: He continues to improve. A little bit less callus around the left wound. He continues to endorse pain in his right midfoot, but this is not as significant as it was last week. The maceration on his left heel is improved. 10/21/2021: Continued improvement to both wounds. The maceration on his left heel is no longer evident. Less callus bilaterally. Epithelialization progressing. 10/28/2021: Significant improvement this week. The right sided wound is nearly closed with just a small open area at the middle. No maceration seen on the left heel. Continued epithelialization on both sides. No concern for infection. 11/04/2021: T oday, the wounds were measured a little bit differently and come out as larger, but I actually think they are about the same to potentially even smaller, particularly on the left. He continues to accumulate some callus on the right. 11/11/2021: T oday, the patient is expressing some concern that the left wound, despite being in the total contact cast, is not progressing  at the  same rate as the 1 on the right. He is interested in trying a week without the cast to see how the wound does. The wounds are roughly the same size as last week, with the right perhaps being a little bit smaller. He continues to build up callus on both sites. 11/18/2021: Last week, I permitted the patient to go without his total contact cast, just to see if the cast was really making any difference. Today, both wounds have deteriorated to some extent, suggesting that the cast is providing benefit, at least on the left. Both are larger and have accumulated callus, slough, and other debris. 11/26/2021: I debrided both wounds quite aggressively last week in an effort to stimulate the healing cascade. This appears to have been effective as the left sided wound is a full centimeter shorter in length. Although the right was measured slightly larger, on inspection, it looks as though an area of epithelialized tissue was included in the measurements. We have been using PolyMem Ag on the wound surfaces with a total contact cast to the left. 12/02/2021: It appears that the intake personnel are including epithelialized tissue in his wound measurements; the right wound is almost completely epithelialized; there is just a crater at the proximal midfoot with some open areas. On the left, he has built up some callus, but the overall wound surface looks good. There is some senescent skin around the wound margin. He has been in PolyMem Ag bilaterally with a total contact cast on the left. 12/09/2021: The right wound is nearly closed; there is just a small open area at the mid calcaneus. On the left, the wound is smaller with minimal callus buildup. No significant drainage. 12/16/2021: The right calcaneal wound remains minimally open at the mid calcaneus; the rest has epithelialized. On the left, the wound is also a little bit smaller. There is some senescent tissue on the periphery. He is getting his first application of a  trial skin substitute called Vendaje today. 12/23/2021: The wound on his right calcaneus is nearly closed; there is just a small area at the most distal aspect of the calcaneus that is open. On the left, the area where we applied to the skin substitute has a healthier-looking bed of granulation tissue. The wound dimensions are not significantly different on this side but the wound surface is improved. 12/30/2021: The wound on the right calcaneus has not changed significantly aside from some accumulation of callus. On the left, the open area is smaller and continues to have an improved surface. He continues to accumulate callus around the wound. He is here for his third application of Vendaje. 01/06/2022: The right calcaneal wound is down to just a couple of millimeters. He continues to accumulate periwound callus. He unfortunately got his cast wet earlier in the week and his left foot is macerated, resulting in some superficial skin loss just distal to the open wound. The open wound itself, however, is much smaller and has a healthier appearing surface. He is here for his fourth application of Vendaje. 01/13/2022: The right calcaneal wound is about the same. Unfortunately, once again, his cast got wet and his foot is again macerated. This is caused the left calcaneal wound to enlarge. He is here for his fifth application of Vendaje. 01/20/2022: The right calcaneal wound is very small. There is some periwound callus accumulation. He purchased a new cast protector last week and this has been effective in avoiding the maceration that has been occurring  on the left. The left calcaneal wound is narrower and has a healthy and viable-appearing surface. He is here for his 6 application of Vendaje. 01/27/2022: The right calcaneal wound is down to just a pinhole. There is some periwound slough and callus. On the left, the wound is narrower and shorter by about a centimeter. The surface is robust and viable-appearing.  Unfortunately, the rep for the trial skin substitute product did not provide any for [place] to use today. 02/04/2022: The right calcaneal wound remains unchanged. There is more accumulated callus. On the left, although the intake nurse measured it a little bit longer, it looks about the same to me. The surface has a layer of slough, but underneath this, there is good granulation tissue. 02/10/2022: The right calcaneus wound is nearly closed. There is still some callus that builds up around the site. The left side looks about the same in terms of dimensions, but the surface is more robust and vital-appearing. 02/16/2022: The area of the right calcaneus that was nearly closed last week has closed, but there is a small opening at the mid foot where it looks like some moisture got retained and caused some reopening. The left foot wound is narrower and shallower. Both sites have a fair amount of periwound callus and eschar. 02/24/2022: The small midfoot opening on the right calcaneus is a little bit smaller today. The left foot wound is narrower and shallower. He continues to accumulate periwound callus. No concern for infection. 03/01/2022: The patient came to clinic early because he showered and got his cast wet. Fortunately, there is no significant maceration to his foot but the callus softened and it looks like the wound on his left calcaneus may be a little bit wider. The wound on his right calcaneus is just a narrow slit. Continued accumulation of periwound callus bilaterally. 03/08/2022: The wound on his right calcaneus is very nearly closed, just a small pinpoint opening under a bit of eschar; the left wound has come in quite a bit, as well. It is narrower and shorter than at our last visit. Still with accumulated callus and eschar bilaterally. 03/17/2022: The right calcaneal wound is healed. The left wound is smaller and the surface itself is very clean, but there is some blue-green staining on the periwound  callus, concerning for Pseudomonas aeruginosa. [name], [name] ([id number]) [id number] Page 5 of 17 03/23/2022: The right calcaneal wound remains closed. The left wound continues to contract. No further blue-green staining. Small amount of callus and slough accumulation. 03/28/2022: He came in early today because he had gotten his cast wet. On inspection, the wound itself did not get wet or macerated, just a little bit of the forefoot. The wound itself is basically unchanged. 04/07/2022: The right foot wound remains closed. The left wound is the smallest that I have seen it to date. It is narrower and shorter. It still continues to accumulate slough on the surface. 04/15/2022: There is a band of epithelium now dividing the small left plantar foot wound in 2. There is still some slough on the surface. 04/21/2022: The wound continues to narrow. Just a little bit of slough on the surface. He seems to be responding well to endoform. 04/28/2022: Continued slow contraction of the wound. There is a little slough on the surface and some periwound callus. We have been using endoform and total contact cast. 05/05/2022: The wound appears to have stalled. There is slough and some periwound eschar/callus. No concern for infection, however. 05/12/2022: Unfortunately,  his right foot has reopened. It is located at the most posterior aspect of his surgical incision. The area was noted to have drainage coming from it when his padding was removed today. Underneath some callus and senescent skin, there is an opening. No purulent drainage or malodor. On the left foot, the wound is again unchanged. There is some light blue staining on the callus, but no malodor or purulent drainage. 10/13; right and left heel remanence of extensive plantar foot wounds. These are better than I remember by quite a big margin however he is still left with wounds on the left plantar heel and the right plantar heel. Been  using endoform bilaterally. A culture was done that showed apparently Pseudomonas but we are still waiting for the [facility] antibiotic to use gentamicin today. He is still very active by description I am not sure about the offloading of his noncasted right foot 10/20; both wounds right and left heel debrided not much change from last week. [name] has arrived which is linezolid, gentamicin and ciprofloxacin we will use this with endoform. T contact cast on the left otal 06/02/2022: Both wounds are smaller today. There is still a fair amount of callus buildup around the right foot ulcer. The left is more superficial and nearly flush with the surrounding tissues. Also with slough and eschar buildup. 06/10/2022: The right sided wound appears to be nearly closed, if not completely so, although it is somewhat difficult to tell given the abnormal tissue and scarring in his foot. There is a fair amount of callus and crust accumulation. On the left, the wound looks about the same, again with callus and slough. He has an appointment next Thursday with Dr. Annamary Rummage in podiatry; I am hopeful that there may be some reconstructive options available for Mr. Hansman. 06/16/2022: Both wounds have some eschar and callus accumulation. The right sided wound is extremely narrow and barely open; the left is narrower than last week. There is a little bit of slough. He has his appointment in podiatry later today. 06/23/2022: The patient met with Dr. Annamary Rummage last week and unfortunately, there are no reconstructive options that he believes would be helpful. He did order an MRI to evaluate for osteomyelitis and fortunately, none was seen. The left sided wound is a little bit shorter and narrower today. The right sided wound is about the same. There is callus and eschar accumulation bilaterally. 06/29/2022: Both feet have improved from last week. There is epithelium making a valiant effort to creep across the surface on  the left. The right side looks like it got a little dry and the deep crevasse in his midfoot has cracked. Both have eschar and there is some slough on the left. 07/07/2022: Both feet have improved. There is epithelium completely covering the calcaneus on the right with just a small opening in the crevasse in his midfoot. On the left, the open area of tissue is smaller but he continues to build up callus/eschar and slough. 07/15/2022: The opening in the midfoot on the right is about the same size, covered with eschar and a little bit of slough. The open portion of the left wound is narrower and shorter with a bit of slough buildup. He admits to being on his feet more than recommended. 12/14; as far as I can tell everything on the right foot is closed. There is some eschar I removed some of this I cannot identify any open wound here. As usual this will be a very vulnerable  area going forward. On the left this looks really quite healthy. I was pleasantly surprised to see how good this looked. Wound is certainly smaller and there appears to be healthy epithelialization. He has been using Promogran on the right and endoform on the left. He has been offloading the right foot with a heel offloading boot and he has a running shoe on the right foot 08/04/2022: The right foot remains closed. He has a thick cushioned insole in his sneaker. The left sided wound is smaller with just some slough and eschar accumulation. He is wearing the heel off loader on this foot. 08/15/2022: The right foot remains closed. The left wound has narrowed further. There is some slough and eschar accumulation. 08/25/2022: We put him in a peg assist shoe insert and as a result, he has more epithelialization of the ulcer with minimal slough and eschar accumulation. 09/01/2022: The wound is smaller by about half this week. Still with some slough on the surface. The peg assist shoe seems to be doing a remarkable job of adequately offloading  the site. 09/08/2022: There is a little bit more epithelium coming in. There is some slough and callus buildup. 09/16/2022: The wound measures about the same size, but the epithelium that has grown in looks more robust and stronger. There is some slough and callus buildup. 09/23/2022: The wound remains about the same size. The skin edges are looking rather senescent. 09/29/2022: The aggressive debridement I performed last week seems to have been effective. The wound is smaller and has significantly less slough accumulation. 10/06/2022: There has been quite a bit of epithelialization since last week. There are still some open areas with slough accumulation. There is some callus buildup around the perimeter. 10/13/2022: Continued improvement. Minimal callus accumulation. 3/14; patient presents for follow-up. He has been using endoform to the wound bed without issues. He is using a surgical shoe with peg assist for offloading. 10/27/2022: The wound dimensions are stable. There is some senescent skin accumulation around the perimeter. 11/04/2022: Last week I performed a very aggressive debridement in an effort to stimulate the healing cascade. As has been the case when I have done this before, the wound has responded well. There has been epithelialization and contraction of the wound. There are just a couple of small open areas. 11/10/2022: Unfortunately, his foot got wet secondary to sweating and there has been some breakdown of the tissue, particularly the skin just adjacent to the RAJENDER, SOKOLSKI ([id number]) [id number] Page 6 of 17 main ulcer. 11/18/2022: We continue to struggle with moisture-related tissue breakdown around the perimeter of his wound. This has caused the thin epithelium that had formed on the surface to disintegrate. The underlying surface of the wound has good granulation tissue. 11/25/2022: The edges of the wound are much less macerated, but the surface is actually  looking a little bit dry. There is slough accumulation. No obvious signs of infection. 12/01/2022: The wound edges are more macerated and broken down, making the wound larger. The surface has some slough on it. 12/08/2020: The wound looks better this week. There is significantly less maceration and moisture-related tissue breakdown. There is some slough on the wound surface. 12/15/2022: The wound continues to improve. There is almost no moisture-related tissue breakdown. There is epithelium beginning to fill in again from the edges. Light slough on the wound surface. 12/22/2022: More epithelium has filled him from around the edges. There is some slough on the surface. No evidence of moisture-related tissue breakdown. 01/05/2023: There  has been more epithelialization, particularly at the posterior calcaneal aspect of the wound. There has been a little bit of moisture accumulation with minimal maceration. 01/12/2023: More epithelium has filled in. There is very minimal accumulation of slough. 01/20/2023: The wound is measuring a little bit smaller today. It did measure deeper, but this appears to be secondary to some heaped up senescent skin around the edges. The epithelium that has been present at the posterior aspect of the wound seems to be a bit more robust with greater integrity. 01/26/2023: He reports intense itching to the skin around the wound and there has been some breakdown here. It looks fungal in nature. The wound itself looks pretty good with improved epithelialization with just some slough buildup. He has a little callus along the wound edges. 02/02/2023: He has been treating the periwound skin with an over-the-counter athlete's foot medication and notes significant improvement in his pruritus. The skin looks better here, as well. The wound continues to epithelialize. There is light slough accumulation. 02/10/2023: The periwound skin still looks fairly angry. He reports multiple small blisters that  have opened up. He is not having the intense itching that he had prior, however. There has been more epithelialization at the calcaneus and there is minimal slough accumulation. 02/16/2023: The periwound skin is improving. An online review of dermatology rash images suggest that this may be hand-foot-and-mouth disease, isolated to the feet; the patient says he actually had this as a child. The calcaneus continues to epithelialize and the tissue was quite robust here. Very minimal slough accumulation on the open portion of the wound with a little bit of periwound callus buildup. 03/03/2023: The wound measurements are about the same, but there is more epithelium making its way to the surface. There is a little bit of crust and eschar around the edges and minimal slough on the surface. 03/09/2023: His dressing was applied rather haphazardly and the drawtex was not applied. The Optifoam was also reversed such that the orange side was in contact with his bare skin. He has some maceration around the wound edges, but this did not appear to result in any tissue breakdown. There is a little bit of slough on the surface and minimal eschar around the edges. 03/15/2023: The wound measured narrower today. There is some slough accumulation on the surface and some eschar around the edges. Electronic Signature(s) Signed: 03/15/2023 9:57:56 AM By: [name] MD FACS Entered By: [name] on 03/15/2023 09:57:55 -------------------------------------------------------------------------------- Physical Exam Details Patient Name: Date of Service: [name], TO [name] 03/15/2023 9:30 A M Medical Record Number: [id number] Patient Account Number: [id number] Date of Birth/Sex: Treating RN: 1976-01-31 (47 y.o. M) Primary Care Provider: Dorinda Hill Other Clinician: Referring Provider: Treating Provider/Extender: Jana Hakim in Treatment: 139 Constitutional . . . . no acute  distress. Respiratory Normal work of breathing on room air. Notes 03/15/2023: The wound measured narrower today. There is some slough accumulation on the surface and some eschar around the edges. Electronic Signature(s) Signed: 03/15/2023 10:02:04 AM By: [name] MD FACS Entered By: [name] on 03/15/2023 10:02:04 [name] ([id number]) [id number] Page 7 of 17 -------------------------------------------------------------------------------- Physician Orders Details Patient Name: Date of Service: [name] 03/15/2023 9:30 A M Medical Record Number: [id number] Patient Account Number: [id number] Date of Birth/Sex: Treating RN: [birth year] (47 y.o. [name] Primary Care Provider: Dorinda Hill Other Clinician: Referring Provider: Treating Provider/Extender: Jana Hakim in Treatment: [phone] Verbal /  Phone Orders: No Diagnosis Coding ICD-10 Coding Code Description L97.522 Non-pressure chronic ulcer of other part of left foot with fat layer exposed Follow-up Appointments ppointment in 1 week. - Dr. Lady Gary Room 3 Return A Anesthetic Wound #2 Left Calcaneus (In clinic) Topical Lidocaine 4% applied to wound bed - In clinic Bathing/ Shower/ Hygiene May shower and wash wound with soap and water. Edema Control - Lymphedema / SCD / Other Bilateral Lower Extremities Avoid standing for long periods of time. Moisturize legs daily. - as needed Off-Loading Other: - keep pressure off of the bottom of your feet. Elevate legs throughout the day. Use the Shoe with the PegAssist off-loading insole Additional Orders / Instructions Follow Nutritious Diet - Try to get 70-100 grams of Protein a day+ Wound Treatment Wound #2 - Calcaneus Wound Laterality: Left Cleanser: Normal Saline (Generic) 1 x Per Day/30 Days Discharge Instructions: Cleanse the wound with Normal Saline prior to applying a clean dressing using gauze  sponges, not tissue or cotton balls. Cleanser: Wound Cleanser (Generic) 1 x Per Day/30 Days Discharge Instructions: Cleanse the wound with wound cleanser prior to applying a clean dressing using gauze sponges, not tissue or cotton balls. Peri-Wound Care: Ketoconazole Cream 2% 1 x Per Day/30 Days Discharge Instructions: Apply Ketoconazole mixed with zinc to the periwound Peri-Wound Care: Zinc Oxide Ointment 30g tube 1 x Per Day/30 Days Discharge Instructions: Apply Zinc Oxide to periwound with each dressing change Topical: Gentamicin (Generic) 1 x Per Day/30 Days Discharge Instructions: apply to the wound bed Prim Dressing: Maxorb Extra Ag+ Alginate Dressing, 4x4.75 (in/in) (Generic) 1 x Per Day/30 Days ary Discharge Instructions: Apply to wound bed as instructed Secondary Dressing: Drawtex 4x4 in (Generic) 1 x Per Day/30 Days Discharge Instructions: Apply over primary dressing as directed. Secondary Dressing: Optifoam Non-Adhesive Dressing, 4x4 in (Generic) 1 x Per Day/30 Days Discharge Instructions: Apply over primary dressing as directed. Secondary Dressing: Woven Gauze Sponge, Non-Sterile 4x4 in (Generic) 1 x Per Day/30 Days Discharge Instructions: Apply over primary dressing as directed. Secured With: 53M Medipore H Soft Cloth Surgical Tape, 4 x 10 (in/yd) (Generic) 1 x Per Day/30 Days JAITHAN, MORLAN ([id number]) [id number] Page 8 of 17 Discharge Instructions: Secure with tape as directed. Compression Wrap: Kerlix Roll 4.5x3.1 (in/yd) (Generic) 1 x Per Day/30 Days Discharge Instructions: Apply Kerlix and Coban compression as directed. Add-Ons: [employer] Offloading Boot, Size Regular 1 x Per Day/30 Days Electronic Signature(s) Signed: 03/15/2023 10:31:50 AM By: [name] MD FACS Entered By: [name] on 03/15/2023 10:04:39 -------------------------------------------------------------------------------- Problem List Details Patient Name:  Date of Service: [name], TO [name] 03/15/2023 9:30 A M Medical Record Number: [id number] Patient Account Number: [id number] Date of Birth/Sex: Treating RN: 1976/02/09 (47 y.o. [name] Primary Care Provider: Dorinda Hill Other Clinician: Referring Provider: Treating Provider/Extender: Jana Hakim in Treatment: 139 Active Problems ICD-10 Encounter Code Description Active Date MDM Diagnosis [phone] Non-pressure chronic ulcer of other part of left foot with fat layer exposed 09/03/2020 No Yes Inactive Problems ICD-10 Code Description Active Date Inactive Date L97.512 Non-pressure chronic ulcer of other part of right foot with fat layer exposed 09/03/2020 09/03/2020 L89.893 Pressure ulcer of other site, stage 3 07/15/2020 07/15/2020 M62.81 Muscle weakness (generalized) 07/15/2020 07/15/2020 I10 Essential (primary) hypertension 07/15/2020 07/15/2020 M86.171 Other acute osteomyelitis, right ankle and foot 09/03/2020 09/03/2020 Resolved Problems Electronic Signature(s) Signed: 03/15/2023 9:54:41 AM By: [name] MD FACS Entered By: [name] on 03/15/2023 09:54:40 [name] ([id number]) [id number] Page  9 of 17 -------------------------------------------------------------------------------- Progress Note Details Patient Name: Date of Service: [name], TO [name] 03/15/2023 9:30 A M Medical Record Number: [id number] Patient Account Number: [id number] Date of Birth/Sex: Treating RN: [birth year] (47 y.o. M) Primary Care Provider: Dorinda Hill Other Clinician: Referring Provider: Treating Provider/Extender: Jana Hakim in Treatment: 139 Subjective Chief Complaint Information obtained from Patient Bilateral Plantar Foot Ulcers History of Present Illness (HPI) Wounds are12/03/2020 upon evaluation today patient presents for initial inspection here in our clinic concerning issues he has been having with the  bottoms of his feet bilaterally. He states these actually occurred as wounds when he was hospitalized for 5 months secondary to Covid. He was apparently with tilting bed where he was in an upright position quite frequently and apparently this occurred in some way shape or form during that time. Fortunately there is no sign of active infection at this time. No fevers, chills, nausea, vomiting, or diarrhea. With that being said he still has substantial wounds on the plantar aspects of his feet Theragen require quite a bit of work to get these to heal. He has been using Santyl currently though that is been problematic both in receiving the medication as well as actually paid for it as it is become quite expensive. Prior to the experience with Covid the patient really did not have any major medical problems other than hypertension he does have some mild generalized weakness following the Covid experience. 07/22/2020 on evaluation today patient appears to be doing okay in regard to his foot ulcers I feel like the wound beds are showing signs of better improvement that I do believe the Iodoflex is helping in this regard. With that being said he does have a lot of drainage currently and this is somewhat blue/green in nature which is consistent with Pseudomonas. I do think a culture today would be appropriate for [place] to evaluate and see if that is indeed the case I would likely start him on antibiotic orally as well he is not allergic to Cipro knows of no issues he has had in the past 12/21; patient was admitted to the clinic earlier this month with bilateral presumed pressure ulcers on the bottom of his feet apparently related to excessive pressure from a tilt table arrangement in the intensive care unit. Patient relates this to being on ECMO but I am not really sure that is exactly related to that. I must say I have never seen anything like this. He has fairly extensive full-thickness wounds extending from his  heel towards his midfoot mostly centered laterally. There is already been some healing distally. He does not appear to have an arterial issue. He has been using gentamicin to the wound surfaces with Iodoflex to help with ongoing debridement 1/6; this is a patient with pressure ulcers on the bottom of his feet related to excessive pressure from a standing position in the intensive care unit. He is complaining of a lot of pain in the right heel. He is not a diabetic. 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 We have been using Iodoflex to help prepare the surfaces of both wounds for an advanced treatment product. He is nonambulatory spending most of his time in a wheelchair I have asked him not to propel the wheelchair with his heels 1/13; in general his wounds look better not much surface area change we have been using Iodoflex as of last week. I did an x-ray of the right heel as  the patient was complaining of pain. I had some thoughts about a stress fracture perhaps Achilles tendon problems however what it showed was erosive changes along the inferior aspect of the calcaneus he now has a MRI booked for 1/20. 1/20; in general his wounds continue to be better. Some improvement in the large narrow areas proximally in his foot. He is still complaining of pain in the right heel and tenderness in certain areas of this wound. His MRI is tonight. I am not just looking for osteomyelitis that was brought up on the x-ray I am wondering about stress fractures, tendon ruptures etc. He has no such findings on the left. Also noteworthy is that the patient had critical illness neuropathy and some of the discomfort may be actual improvement in nerve function I am just not sure. These wounds were initially in the setting of severe critical illness related to COVID-19. He was put in a standing position. He may have also been on pressors at the point contributing to tissue ischemia. By his  description at some point these wounds were grossly necrotic extending proximally up into the Achilles part of his heel. I do not know that I have ever really seen pictures of them like this although they may exist in epic We have ordered Tri layer Oasis. I am trying to stimulate some granulation in these areas. This is of course assuming the MRI is negative for infection 1/27; since the patient was last here he saw Dr. Earlene Plater of infectious disease. He is planned for vancomycin and ceftriaxone. Prior operative culture grew MSSA. Also ordered baseline lab work. He also ordered arterial studies although the ABIs in our clinic were normal as well as his clinical exam these were normal I do not think he needs to see vascular surgery. His ABIs at the [facility] were 1.22 in the right triphasic waveforms with a normal TBI of 1.15 on the left ABI of 1.22 with triphasic waveforms and a normal TBI of 1.08. Finally he saw Dr. Logan Bores who will follow him in for 2 months. At this point I do not think he felt that he needed a procedure on the right calcaneal bone. Dr. Earlene Plater is elected for broad-spectrum antibiotic The patient is still having pain in the right heel. He walks with a walker 2/3; wounds are generally smaller. He is tolerating his IV antibiotics. I believe this is vancomycin and ceftriaxone. We are still waiting for Oasis burn in terms of his out-of-pocket max which he should be meeting soon given the IV antibiotics, MRIs etc. I have asked him to check in on this. We are using silver collagen in the meantime the wounds look better 2/10; tolerating IV vancomycin and Rocephin. We are waiting to apply for Oasis. Although I am not really sure where he is in his out-of-pocket max. 2/17 started the first application of Oasis trilayer. Still on antibiotics. The wounds have generally look better. The area on the left has a little more surface slough requiring debridement 2/24; second application of Oasis trilayer.  The wound surface granulation is generally look better. The area on the left with undermining laterally I think is come in a bit. 10/08/2020 upon evaluation today patient is here today for [employer] application #3. Fortunately he seems to be doing extremely well with regard to this and we are seeing a lot of new epithelial growth which is great news. Fortunately there is no signs of active infection at this time. 10/16/2020 upon evaluation today patient  appears to be doing well with regard to his foot ulcers. Do believe the Oasis has been of benefit for him. I do not see any signs of infection right now which is great news and I think that he has a lot of new epithelial growth which is great to see as well. The patient is very pleased to hear all of this. I do think we can proceed with the Oasis trilayer #4 today. 3/18; not as much improvement in these areas on his heels that I was hoping. I did reapply trilateral Oasis today the tissue looks healthier but not as much fill [name], [name] ([id number]) [id number] Page 10 of 17 in as I was hoping. 3/25; better looking today I think this is come in a bit the tissue looks healthier. Triple layer Oasis reapplied #6 4/1; somewhat better looking definitely better looking surface not as much change in surface area as I was hoping. He may be spending more time [place] on days then he needs to although he does have heel offloading boots. Triple layer Oasis reapplied #7 4/7; unfortunately apparently [facility] will not approve any further Oasis which is unfortunate since the patient did respond nicely both in terms of the condition of the wound bed as well as surface area. There is still some drainage coming from the wound but not a lot there does not appear to be any infection 4/15; we have been using Hydrofera Blue. He continues to have nice rims of epithelialization on the right greater than the left. The left the  epithelialization is coming from the tip of his heel. There is moderate drainage. In this that concerns me about a total contact cast. There is no evidence of infection 4/29; patient has been using Hydrofera Blue with dressing changes. He has no complaints or issues today. 5/5; using Hydrofera Blue. I actually think that he looks marginally better than the last time I saw this 3 weeks ago. There are rims of epithelialization on the left thumb coming from the medial side on the right. Using Hydrofera Blue 5/12; using Hydrofera Blue. These continue to make improvements in surface area. His drainage was not listed as severe I therefore went ahead and put a cast on the left foot. Right foot we will continue to dress his previous 5/16; back for first total contact cast change. He did not tolerate this particularly well cast injury on the anterior tibia among other issues. Difficulty sleeping. I talked him about this in some detail and afterwards is elected to continue. I told him I would like to have a cast on for 3 weeks to see if this is going to help at all. I think he agreed 5/19; I think the wound is better. There is no tunneling towards his midfoot. The undermining medially also looks better. He has a rim of new skin distally. I think we are making progress here. The area on the left also continues to look somewhat better to me using Hydrofera Blue. He has a list of complaints about the cast but none of them seem serious 5/26; patient presents for 1 week follow-up. He has been using a total contact cast and tolerating this well. Hydrofera Blue is the main dressing used. He denies signs of infection. 6/2 Hydrofera Blue total contact cast on the left. These were large ulcers that formed in intensive care unit where the patient was recovering from COVID. May have had something to do with being ventilated in an upright  positiono Pressors etc. We have been able to get the areas down considerably and a  viable surface. There is some epithelialization in both sides. Note made of drainage 6/9; changed to [facility] last time because of drainage. He arrives with better looking surfaces and dimensions on the left than the right. Paradoxically the right actually probes more towards his midfoot the left is largely close down but both of these look improved. Using a total contact cast on the left 6/16; complex wounds on his bilateral plantar heels which were initially pressure injury from a stay in the ICU with COVID. We have been using silver alginate most recently. His dimensions of come in quite dramatically however not recently. We have been putting the left foot in a total contact cast 6/23; complex wounds on the bilateral plantar heels. I been putting the left in the cast paradoxically the area on the right is the one that is going towards closure at a faster rate. Quite a bit of drainage on the left. The patient went to see Dr. Logan Bores who said he was going to standby for skin grafts. I had actually considered sending him for skin grafts however he would be mandatorily off his feet for a period of weeks to months. I am thinking that the area on the right is going to close on its own the area on the left has been more stubborn even though we have him in a total contact cast 6/30; took him out of a total contact cast last week is the right heel seem to be making better progress than the left where I was placing the cast. We are using silver alginate. Both wounds are smaller right greater than left 7/12; both wounds look as though they are making some progress. We are using silver alginate. Heel offloading boots 7/26; very gradual progress especially on the right. Using silver alginate. He is wearing heel offloading boots 8/18; he continues to close these wounds down very gradually. Using silver alginate. The problem polymen being definitive about this is areas of what appears to be callus around the  margins. This is not a 100% of the area but certainly sizable especially on the right 9/1; bilateral plantar feet wounds secondary to prolonged pressure while being ventilated for COVID-19 in an upright position. Essentially pressure ulcers on the bottom of his feet. He is made substantial progress using silver alginate. 9/14; bilateral plantar feet wounds secondary to prolonged pressure. Making progress using silver alginate. 9/29 bilateral plantar feet wounds secondary to prolonged pressure. I changed him to Iodoflex last week. MolecuLight showing reddened blush fluorescence 10/11; patient presents for follow-up. He has no issues or complaints today. He denies signs of infection. He continues to use Iodoflex and antibiotic ointment to the wound beds. 10/27; 2-week follow-up. No evidence of infection. He has callus and thick dry skin around the wound margins we have been using Iodoflex and Bactroban which was in response to a moderate left MolecuLight reddish blush fluorescence. 11/10; 2-week follow-up. Wound margins again have thick callus however the measurements of the actual wound sites are a lot smaller. Everything looks reasonably healthy here. We have been using Iodoflex He was approved for prime matrix but I have elected to delay this given the improvement in the surface area. Hopefully I will not regret that decision as were getting close to the end of the year in terms of insurance payment 12/8; 2-week follow-up. Wounds are generally smaller in size. These were initially substantial wounds  extending into the forefoot all the way into the heel on the bilateral plantar feet. They are now both located on the plantar heel distal aspect both of these have a lot of callus around the wounds I used a #5 curette to remove this on the right and the left also some subcutaneous debris to try and get the wound edges were using Iodoflex. He has heel offloading shoe 12/22; 2-week follow-up. Not really  much improvement. He has thick callus around the outer edges of both wounds. I remove this there is some nonviable subcutaneous tissue as well. We have been using Iodoflex. Her intake nurse and myself spontaneously thought of a total contact cast I went back in May. At that time we really were not seeing much of an improvement with a cast although the wound was in a much different situation I would like to retry this in 2 weeks and I discussed this with the patient 08/12/2021; the patient has had some improvement with the Iodoflex. The the area on the left heel plantar more improved than the right. I had to put him in a total contact cast on the left although I decided to put that off for 2 weeks. I am going to change his primary dressing to silver collagen. I think in both areas he has had some improvement most of the healing seems to be more proximal in the heel. The wounds are in the mid aspect. A lot of thick callus on the right heel however. 1/19; we are using silver collagen on both plantar heel areas. He has had some improvement today. The left did not require any debridement. He still had some eschar on the right that was debrided but both seem to have contracted. I did not put it total contact cast on him today 2/2 we have been using silver collagen. The area on the right plantar heel has areas that appear to be epithelialized interspersed with dry flaking callus and dry skin. I removed this. This really looks better than on the other side. On the left still a large area with raised edges and debris on the surface. 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 He has no issues or complaints today. 2/9; not much change in the left foot wound with 1 week of a cast we are using silver collagen. Silver collagen on the right side. The right side has been the better wound  surface. We will reapply the total contact cast on the left 2/16; not much improvement on either side I been using silver collagen with a total contact cast on the left. I'm changing the Hydrofera Blue still with a total contact cast on the left 2/23; some improvement on both sides. Disappointing that he has thick callus around the area that we are putting in a total contact cast on the left. We've been using Hydrofera Blue on both wound areas. This is a man who at essentially pressure ulcers in addition to ischemia caused by medications to support his blood pressure (pressors) in the ICU. He was being ventilated in the standing position for severe Covid. A Shiley the wounds extended across his entire foot but are now localized to his plantar heels bilaterally. We have made progress however neither areas healed. I continue to think the total contact cast is helped albeit painstakingly slowly. He has never wanted a  plastic surgery consult although I don't know that they would be interested in grafting in area in this location. 10/07/2021: Continued improvement bilaterally. There is still some callus around the left wound, despite the total contact cast. He has some increased pain in his right midfoot around 1 particular area. This has been painful in the past but seems to be a little bit worse. When his cast was removed today, there was an area on the heel of the left foot that looks a bit macerated. He is also complaining of pain in his left thigh and hip which he thinks is secondary to the limb length [name], [name] ([id number]) [id number] Page 11 of 17 discrepancy caused by the cast. 10/14/2021: He continues to improve. A little bit less callus around the left wound. He continues to endorse pain in his right midfoot, but this is not as significant as it was last week. The maceration on his left heel is improved. 10/21/2021: Continued improvement to both wounds. The  maceration on his left heel is no longer evident. Less callus bilaterally. Epithelialization progressing. 10/28/2021: Significant improvement this week. The right sided wound is nearly closed with just a small open area at the middle. No maceration seen on the left heel. Continued epithelialization on both sides. No concern for infection. 11/04/2021: T oday, the wounds were measured a little bit differently and come out as larger, but I actually think they are about the same to potentially even smaller, particularly on the left. He continues to accumulate some callus on the right. 11/11/2021: T oday, the patient is expressing some concern that the left wound, despite being in the total contact cast, is not progressing at the same rate as the 1 on the right. He is interested in trying a week without the cast to see how the wound does. The wounds are roughly the same size as last week, with the right perhaps being a little bit smaller. He continues to build up callus on both sites. 11/18/2021: Last week, I permitted the patient to go without his total contact cast, just to see if the cast was really making any difference. Today, both wounds have deteriorated to some extent, suggesting that the cast is providing benefit, at least on the left. Both are larger and have accumulated callus, slough, and other debris. 11/26/2021: I debrided both wounds quite aggressively last week in an effort to stimulate the healing cascade. This appears to have been effective as the left sided wound is a full centimeter shorter in length. Although the right was measured slightly larger, on inspection, it looks as though an area of epithelialized tissue was included in the measurements. We have been using PolyMem Ag on the wound surfaces with a total contact cast to the left. 12/02/2021: It appears that the intake personnel are including epithelialized tissue in his wound measurements; the right wound is almost  completely epithelialized; there is just a crater at the proximal midfoot with some open areas. On the left, he has built up some callus, but the overall wound surface looks good. There is some senescent skin around the wound margin. He has been in PolyMem Ag bilaterally with a total contact cast on the left. 12/09/2021: The right wound is nearly closed; there is just a small open area at the mid calcaneus. On the left, the wound is smaller with minimal callus buildup. No significant drainage. 12/16/2021: The right calcaneal wound remains minimally open at the mid calcaneus; the rest has epithelialized. On the  left, the wound is also a little bit smaller. There is some senescent tissue on the periphery. He is getting his first application of a trial skin substitute called Vendaje today. 12/23/2021: The wound on his right calcaneus is nearly closed; there is just a small area at the most distal aspect of the calcaneus that is open. On the left, the area where we applied to the skin substitute has a healthier-looking bed of granulation tissue. The wound dimensions are not significantly different on this side but the wound surface is improved. 12/30/2021: The wound on the right calcaneus has not changed significantly aside from some accumulation of callus. On the left, the open area is smaller and continues to have an improved surface. He continues to accumulate callus around the wound. He is here for his third application of Vendaje. 01/06/2022: The right calcaneal wound is down to just a couple of millimeters. He continues to accumulate periwound callus. He unfortunately got his cast wet earlier in the week and his left foot is macerated, resulting in some superficial skin loss just distal to the open wound. The open wound itself, however, is much smaller and has a healthier appearing surface. He is here for his fourth application of Vendaje. 01/13/2022: The right calcaneal wound is about the same.  Unfortunately, once again, his cast got wet and his foot is again macerated. This is caused the left calcaneal wound to enlarge. He is here for his fifth application of Vendaje. 01/20/2022: The right calcaneal wound is very small. There is some periwound callus accumulation. He purchased a new cast protector last week and this has been effective in avoiding the maceration that has been occurring on the left. The left calcaneal wound is narrower and has a healthy and viable-appearing surface. He is here for his 6 application of Vendaje. 01/27/2022: The right calcaneal wound is down to just a pinhole. There is some periwound slough and callus. On the left, the wound is narrower and shorter by about a centimeter. The surface is robust and viable-appearing. Unfortunately, the rep for the trial skin substitute product did not provide any for [place] to use today. 02/04/2022: The right calcaneal wound remains unchanged. There is more accumulated callus. On the left, although the intake nurse measured it a little bit longer, it looks about the same to me. The surface has a layer of slough, but underneath this, there is good granulation tissue. 02/10/2022: The right calcaneus wound is nearly closed. There is still some callus that builds up around the site. The left side looks about the same in terms of dimensions, but the surface is more robust and vital-appearing. 02/16/2022: The area of the right calcaneus that was nearly closed last week has closed, but there is a small opening at the mid foot where it looks like some moisture got retained and caused some reopening. The left foot wound is narrower and shallower. Both sites have a fair amount of periwound callus and eschar. 02/24/2022: The small midfoot opening on the right calcaneus is a little bit smaller today. The left foot wound is narrower and shallower. He continues to accumulate periwound callus. No concern for infection. 03/01/2022: The patient came to clinic  early because he showered and got his cast wet. Fortunately, there is no significant maceration to his foot but the callus softened and it looks like the wound on his left calcaneus may be a little bit wider. The wound on his right calcaneus is just a narrow slit. Continued accumulation  of periwound callus bilaterally. 03/08/2022: The wound on his right calcaneus is very nearly closed, just a small pinpoint opening under a bit of eschar; the left wound has come in quite a bit, as well. It is narrower and shorter than at our last visit. Still with accumulated callus and eschar bilaterally. 03/17/2022: The right calcaneal wound is healed. The left wound is smaller and the surface itself is very clean, but there is some blue-green staining on the periwound callus, concerning for Pseudomonas aeruginosa. 03/23/2022: The right calcaneal wound remains closed. The left wound continues to contract. No further blue-green staining. Small amount of callus and slough accumulation. 03/28/2022: He came in early today because he had gotten his cast wet. On inspection, the wound itself did not get wet or macerated, just a little bit of the forefoot. The wound itself is basically unchanged. 04/07/2022: The right foot wound remains closed. The left wound is the smallest that I have seen it to date. It is narrower and shorter. It still continues to accumulate slough on the surface. 04/15/2022: There is a band of epithelium now dividing the small left plantar foot wound in 2. There is still some slough on the surface. 04/21/2022: The wound continues to narrow. Just a little bit of slough on the surface. He seems to be responding well to endoform. [name], [name] ([id number]) [id number] Page 12 of 17 04/28/2022: Continued slow contraction of the wound. There is a little slough on the surface and some periwound callus. We have been using endoform and total contact cast. 05/05/2022: The wound appears to  have stalled. There is slough and some periwound eschar/callus. No concern for infection, however. 05/12/2022: Unfortunately, his right foot has reopened. It is located at the most posterior aspect of his surgical incision. The area was noted to have drainage coming from it when his padding was removed today. Underneath some callus and senescent skin, there is an opening. No purulent drainage or malodor. On the left foot, the wound is again unchanged. There is some light blue staining on the callus, but no malodor or purulent drainage. 10/13; right and left heel remanence of extensive plantar foot wounds. These are better than I remember by quite a big margin however he is still left with wounds on the left plantar heel and the right plantar heel. Been using endoform bilaterally. A culture was done that showed apparently Pseudomonas but we are still waiting for the [facility] antibiotic to use gentamicin today. He is still very active by description I am not sure about the offloading of his noncasted right foot 10/20; both wounds right and left heel debrided not much change from last week. [name] has arrived which is linezolid, gentamicin and ciprofloxacin we will use this with endoform. T contact cast on the left otal 06/02/2022: Both wounds are smaller today. There is still a fair amount of callus buildup around the right foot ulcer. The left is more superficial and nearly flush with the surrounding tissues. Also with slough and eschar buildup. 06/10/2022: The right sided wound appears to be nearly closed, if not completely so, although it is somewhat difficult to tell given the abnormal tissue and scarring in his foot. There is a fair amount of callus and crust accumulation. On the left, the wound looks about the same, again with callus and slough. He has an appointment next Thursday with Dr. Annamary Rummage in podiatry; I am hopeful that there may be some reconstructive options available for Mr.  Konkle. 06/16/2022: Both wounds  have some eschar and callus accumulation. The right sided wound is extremely narrow and barely open; the left is narrower than last week. There is a little bit of slough. He has his appointment in podiatry later today. 06/23/2022: The patient met with Dr. Annamary Rummage last week and unfortunately, there are no reconstructive options that he believes would be helpful. He did order an MRI to evaluate for osteomyelitis and fortunately, none was seen. The left sided wound is a little bit shorter and narrower today. The right sided wound is about the same. There is callus and eschar accumulation bilaterally. 06/29/2022: Both feet have improved from last week. There is epithelium making a valiant effort to creep across the surface on the left. The right side looks like it got a little dry and the deep crevasse in his midfoot has cracked. Both have eschar and there is some slough on the left. 07/07/2022: Both feet have improved. There is epithelium completely covering the calcaneus on the right with just a small opening in the crevasse in his midfoot. On the left, the open area of tissue is smaller but he continues to build up callus/eschar and slough. 07/15/2022: The opening in the midfoot on the right is about the same size, covered with eschar and a little bit of slough. The open portion of the left wound is narrower and shorter with a bit of slough buildup. He admits to being on his feet more than recommended. 12/14; as far as I can tell everything on the right foot is closed. There is some eschar I removed some of this I cannot identify any open wound here. As usual this will be a very vulnerable area going forward. On the left this looks really quite healthy. I was pleasantly surprised to see how good this looked. Wound is certainly smaller and there appears to be healthy epithelialization. He has been using Promogran on the right and endoform on the left. He has been  offloading the right foot with a heel offloading boot and he has a running shoe on the right foot 08/04/2022: The right foot remains closed. He has a thick cushioned insole in his sneaker. The left sided wound is smaller with just some slough and eschar accumulation. He is wearing the heel off loader on this foot. 08/15/2022: The right foot remains closed. The left wound has narrowed further. There is some slough and eschar accumulation. 08/25/2022: We put him in a peg assist shoe insert and as a result, he has more epithelialization of the ulcer with minimal slough and eschar accumulation. 09/01/2022: The wound is smaller by about half this week. Still with some slough on the surface. The peg assist shoe seems to be doing a remarkable job of adequately offloading the site. 09/08/2022: There is a little bit more epithelium coming in. There is some slough and callus buildup. 09/16/2022: The wound measures about the same size, but the epithelium that has grown in looks more robust and stronger. There is some slough and callus buildup. 09/23/2022: The wound remains about the same size. The skin edges are looking rather senescent. 09/29/2022: The aggressive debridement I performed last week seems to have been effective. The wound is smaller and has significantly less slough accumulation. 10/06/2022: There has been quite a bit of epithelialization since last week. There are still some open areas with slough accumulation. There is some callus buildup around the perimeter. 10/13/2022: Continued improvement. Minimal callus accumulation. 3/14; patient presents for follow-up. He has been using endoform to  the wound bed without issues. He is using a surgical shoe with peg assist for offloading. 10/27/2022: The wound dimensions are stable. There is some senescent skin accumulation around the perimeter. 11/04/2022: Last week I performed a very aggressive debridement in an effort to stimulate the healing cascade. As has been  the case when I have done this before, the wound has responded well. There has been epithelialization and contraction of the wound. There are just a couple of small open areas. 11/10/2022: Unfortunately, his foot got wet secondary to sweating and there has been some breakdown of the tissue, particularly the skin just adjacent to the main ulcer. 11/18/2022: We continue to struggle with moisture-related tissue breakdown around the perimeter of his wound. This has caused the thin epithelium that had formed on the surface to disintegrate. The underlying surface of the wound has good granulation tissue. 11/25/2022: The edges of the wound are much less macerated, but the surface is actually looking a little bit dry. There is slough accumulation. No obvious signs of infection. 12/01/2022: The wound edges are more macerated and broken down, making the wound larger. The surface has some slough on it. 12/08/2020: The wound looks better this week. There is significantly less maceration and moisture-related tissue breakdown. There is some slough on the wound surface. GALEN, TRABUE ([id number]) [id number] Page 13 of 17 12/15/2022: The wound continues to improve. There is almost no moisture-related tissue breakdown. There is epithelium beginning to fill in again from the edges. Light slough on the wound surface. 12/22/2022: More epithelium has filled him from around the edges. There is some slough on the surface. No evidence of moisture-related tissue breakdown. 01/05/2023: There has been more epithelialization, particularly at the posterior calcaneal aspect of the wound. There has been a little bit of moisture accumulation with minimal maceration. 01/12/2023: More epithelium has filled in. There is very minimal accumulation of slough. 01/20/2023: The wound is measuring a little bit smaller today. It did measure deeper, but this appears to be secondary to some heaped up senescent skin around the  edges. The epithelium that has been present at the posterior aspect of the wound seems to be a bit more robust with greater integrity. 01/26/2023: He reports intense itching to the skin around the wound and there has been some breakdown here. It looks fungal in nature. The wound itself looks pretty good with improved epithelialization with just some slough buildup. He has a little callus along the wound edges. 02/02/2023: He has been treating the periwound skin with an over-the-counter athlete's foot medication and notes significant improvement in his pruritus. The skin looks better here, as well. The wound continues to epithelialize. There is light slough accumulation. 02/10/2023: The periwound skin still looks fairly angry. He reports multiple small blisters that have opened up. He is not having the intense itching that he had prior, however. There has been more epithelialization at the calcaneus and there is minimal slough accumulation. 02/16/2023: The periwound skin is improving. An online review of dermatology rash images suggest that this may be hand-foot-and-mouth disease, isolated to the feet; the patient says he actually had this as a child. The calcaneus continues to epithelialize and the tissue was quite robust here. Very minimal slough accumulation on the open portion of the wound with a little bit of periwound callus buildup. 03/03/2023: The wound measurements are about the same, but there is more epithelium making its way to the surface. There is a little bit of crust and eschar around the  edges and minimal slough on the surface. 03/09/2023: His dressing was applied rather haphazardly and the drawtex was not applied. The Optifoam was also reversed such that the orange side was in contact with his bare skin. He has some maceration around the wound edges, but this did not appear to result in any tissue breakdown. There is a little bit of slough on the surface and minimal eschar around the  edges. 03/15/2023: The wound measured narrower today. There is some slough accumulation on the surface and some eschar around the edges. Patient History Information obtained from Patient. Family History Cancer - Maternal Grandparents, Diabetes - Father,Paternal Grandparents, Heart Disease - Maternal Grandparents, Hypertension - Father,Paternal Grandparents, Lung Disease - Siblings, No family history of Hereditary Spherocytosis, Kidney Disease, Seizures, Stroke, Thyroid Problems, Tuberculosis. Social History Never smoker, Marital Status - Married, Alcohol Use - Never, Drug Use - No History, Caffeine Use - Daily - tea, soda. Medical History Eyes Denies history of Cataracts, Glaucoma, Optic Neuritis Ear/Nose/Mouth/Throat Denies history of Chronic sinus problems/congestion, Middle ear problems Hematologic/Lymphatic Denies history of Anemia, Hemophilia, Human Immunodeficiency Virus, Lymphedema, Sickle Cell Disease Respiratory Patient has history of Asthma Denies history of Aspiration, Chronic Obstructive Pulmonary Disease (COPD), Pneumothorax, Sleep Apnea, Tuberculosis Cardiovascular Patient has history of Angina - with COVID, Hypertension Denies history of Arrhythmia, Congestive Heart Failure, Coronary Artery Disease, Deep Vein Thrombosis, Hypotension, Myocardial Infarction, Peripheral Arterial Disease, Peripheral Venous Disease, Phlebitis, Vasculitis Gastrointestinal Denies history of Cirrhosis , Colitis, Crohns, Hepatitis A, Hepatitis B, Hepatitis C Endocrine Denies history of Type I Diabetes, Type II Diabetes Genitourinary Denies history of End Stage Renal Disease Immunological Denies history of Lupus Erythematosus, Raynauds, Scleroderma Integumentary (Skin) Denies history of History of Burn Musculoskeletal Denies history of Gout, Rheumatoid Arthritis, Osteoarthritis, Osteomyelitis Neurologic Denies history of Dementia, Neuropathy, Quadriplegia, Paraplegia, Seizure  Disorder Oncologic Denies history of Received Chemotherapy, Received Radiation Psychiatric Denies history of Anorexia/bulimia, Confinement Anxiety Hospitalization/Surgery History - COVID PNA 07/22/2019- 11/14/2019. - 03/27/2020 wound debridement/ skin graft. Medical A Surgical History Notes nd Constitutional Symptoms (General Health) COVID PNA 07/22/2019-11/14/2019 VENT ECMO, foot drop left foot , Genitourinary kidney stone Psychiatric anxiety MAKAI, CORRIS ([id number]) [id number] Page 14 of 17 Objective Constitutional no acute distress. Vitals Time Taken: 9:28 AM, Height: 69 in, Weight: 280 lbs, BMI: 41.3, Temperature: 97.8 F, Pulse: 73 bpm, Respiratory Rate: 18 breaths/min, Blood Pressure: 124/77 mmHg. Respiratory Normal work of breathing on room air. General Notes: 03/15/2023: The wound measured narrower today. There is some slough accumulation on the surface and some eschar around the edges. Integumentary (Hair, Skin) Wound #2 status is Open. Original cause of wound was Pressure Injury. The date acquired was: 10/07/2019. The wound has been in treatment 139 weeks. The wound is located on the Left Calcaneus. The wound measures 4.5cm length x 1.5cm width x 0.2cm depth; 5.301cm^2 area and 1.06cm^3 volume. There is Fat Layer (Subcutaneous Tissue) exposed. There is no tunneling or undermining noted. There is a medium amount of serosanguineous drainage noted. The wound margin is distinct with the outline attached to the wound base. There is large (67-100%) pink granulation within the wound bed. There is a small (1-33%) amount of necrotic tissue within the wound bed including Adherent Slough. The periwound skin appearance had no abnormalities noted for moisture. The periwound skin appearance exhibited: Scarring. The periwound skin appearance did not exhibit: Callus, Crepitus, Excoriation, Induration, Rash, Atrophie Blanche, Cyanosis, Ecchymosis, Hemosiderin  Staining, Mottled, Pallor, Rubor, Erythema. Periwound temperature was noted as No Abnormality. The periwound has  tenderness on palpation. Assessment Active Problems ICD-10 Non-pressure chronic ulcer of other part of left foot with fat layer exposed Procedures Wound #2 Pre-procedure diagnosis of Wound #2 is a Pressure Ulcer located on the Left Calcaneus . There was a Selective/Open Wound Non-Viable Tissue Debridement with a total area of 5.3 sq cm performed by [name], MD. With the following instrument(s): Curette to remove Non-Viable tissue/material. Material removed includes Callus and Slough and after achieving pain control using Lidocaine 4% T opical Solution. No specimens were taken. A time out was conducted at 09:50, prior to the start of the procedure. A Minimum amount of bleeding was controlled with Pressure. The procedure was tolerated well with a pain level of 0 throughout and a pain level of 0 following the procedure. Post Debridement Measurements: 4.5cm length x 1.5cm width x 0.2cm depth; 1.06cm^3 volume. Post debridement Stage noted as Category/Stage III. Character of Wound/Ulcer Post Debridement is improved. Post procedure Diagnosis Wound #2: Same as Pre-Procedure General Notes: Scribed for Dr Lady Gary by Brenton Grills RN.Marland Kitchen Plan Follow-up Appointments: Return Appointment in 1 week. - Dr. Lady Gary Room 3 Anesthetic: Wound #2 Left Calcaneus: (In clinic) Topical Lidocaine 4% applied to wound bed - In clinic Bathing/ Shower/ Hygiene: May shower and wash wound with soap and water. Edema Control - Lymphedema / SCD / Other: Avoid standing for long periods of time. Moisturize legs daily. - as needed Off-Loading: Other: - keep pressure off of the bottom of your feet. Elevate legs throughout the day. Use the Shoe with the PegAssist off-loading insole Additional Orders / Instructions: Follow Nutritious Diet - Try to get 70-100 grams of Protein a day+ WOUND #2: - Calcaneus Wound  Laterality: Left Cleanser: Normal Saline (Generic) 1 x Per Day/30 Days Discharge Instructions: Cleanse the wound with Normal Saline prior to applying a clean dressing using gauze sponges, not tissue or cotton balls. Cleanser: Wound Cleanser (Generic) 1 x Per Day/30 Days Discharge Instructions: Cleanse the wound with wound cleanser prior to applying a clean dressing using gauze sponges, not tissue or cotton balls. Peri-Wound Care: Ketoconazole Cream 2% 1 x Per Day/30 Days [name], [name] ([id number]) [id number] Page 15 of 17 Discharge Instructions: Apply Ketoconazole mixed with zinc to the periwound Peri-Wound Care: Zinc Oxide Ointment 30g tube 1 x Per Day/30 Days Discharge Instructions: Apply Zinc Oxide to periwound with each dressing change Topical: Gentamicin (Generic) 1 x Per Day/30 Days Discharge Instructions: apply to the wound bed Prim Dressing: Maxorb Extra Ag+ Alginate Dressing, 4x4.75 (in/in) (Generic) 1 x Per Day/30 Days ary Discharge Instructions: Apply to wound bed as instructed Secondary Dressing: Drawtex 4x4 in (Generic) 1 x Per Day/30 Days Discharge Instructions: Apply over primary dressing as directed. Secondary Dressing: Optifoam Non-Adhesive Dressing, 4x4 in (Generic) 1 x Per Day/30 Days Discharge Instructions: Apply over primary dressing as directed. Secondary Dressing: Woven Gauze Sponge, Non-Sterile 4x4 in (Generic) 1 x Per Day/30 Days Discharge Instructions: Apply over primary dressing as directed. Secured With: 19M Medipore H Soft Cloth Surgical T ape, 4 x 10 (in/yd) (Generic) 1 x Per Day/30 Days Discharge Instructions: Secure with tape as directed. Com pression Wrap: Kerlix Roll 4.5x3.1 (in/yd) (Generic) 1 x Per Day/30 Days Discharge Instructions: Apply Kerlix and Coban compression as directed. Add-Ons: [employer] Offloading Boot, Size Regular 1 x Per Day/30 Days 03/15/2023: The wound measured narrower today. There is some slough  accumulation on the surface and some eschar around the edges. I used a curette to debride slough and eschar from his wound.  We will continue topical gentamicin, silver alginate, and drawtex. Continue offloading with peg assist insole. Follow-up in 1 week. Electronic Signature(s) Signed: 03/15/2023 10:05:29 AM By: [name] MD FACS Entered By: [name] on 03/15/2023 10:05:29 -------------------------------------------------------------------------------- HxROS Details Patient Name: Date of Service: [name], [name] 03/15/2023 9:30 A M Medical Record Number: [id number] Patient Account Number: [id number] Date of Birth/Sex: Treating RN: 05-24-1976 (47 y.o. M) Primary Care Provider: Dorinda Hill Other Clinician: Referring Provider: Treating Provider/Extender: Jana Hakim in Treatment: 139 Information Obtained From Patient Constitutional Symptoms (General Health) Medical History: Past Medical History Notes: COVID PNA 07/22/2019-11/14/2019 VENT ECMO, foot drop left foot , Eyes Medical History: Negative for: Cataracts; Glaucoma; Optic Neuritis Ear/Nose/Mouth/Throat Medical History: Negative for: Chronic sinus problems/congestion; Middle ear problems Hematologic/Lymphatic Medical History: Negative for: Anemia; Hemophilia; Human Immunodeficiency Virus; Lymphedema; Sickle Cell Disease Respiratory Medical History: Positive for: Asthma Negative for: Aspiration; Chronic Obstructive Pulmonary Disease (COPD); Pneumothorax; Sleep Apnea; Tuberculosis [name], [name] ([id number]) [id number] Page 16 of 17 Cardiovascular Medical History: Positive for: Angina - with COVID; Hypertension Negative for: Arrhythmia; Congestive Heart Failure; Coronary Artery Disease; Deep Vein Thrombosis; Hypotension; Myocardial Infarction; Peripheral Arterial Disease; Peripheral Venous Disease; Phlebitis; Vasculitis Gastrointestinal Medical  History: Negative for: Cirrhosis ; Colitis; Crohns; Hepatitis A; Hepatitis B; Hepatitis C Endocrine Medical History: Negative for: Type I Diabetes; Type II Diabetes Genitourinary Medical History: Negative for: End Stage Renal Disease Past Medical History Notes: kidney stone Immunological Medical History: Negative for: Lupus Erythematosus; Raynauds; Scleroderma Integumentary (Skin) Medical History: Negative for: History of Burn Musculoskeletal Medical History: Negative for: Gout; Rheumatoid Arthritis; Osteoarthritis; Osteomyelitis Neurologic Medical History: Negative for: Dementia; Neuropathy; Quadriplegia; Paraplegia; Seizure Disorder Oncologic Medical History: Negative for: Received Chemotherapy; Received Radiation Psychiatric Medical History: Negative for: Anorexia/bulimia; Confinement Anxiety Past Medical History Notes: anxiety Immunizations Pneumococcal Vaccine: Received Pneumococcal Vaccination: No Implantable Devices None Hospitalization / Surgery History Type of Hospitalization/Surgery COVID PNA 07/22/2019- 11/14/2019 03/27/2020 wound debridement/ skin graft Family and Social History Cancer: Yes - Maternal Grandparents; Diabetes: Yes - Father,Paternal Grandparents; Heart Disease: Yes - Maternal Grandparents; Hereditary Spherocytosis: No; Hypertension: Yes - Father,Paternal Grandparents; Kidney Disease: No; Lung Disease: Yes - Siblings; Seizures: No; Stroke: No; Thyroid Problems: No; Tuberculosis: No; Never smoker; Marital Status - Married; Alcohol Use: Never; Drug Use: No History; Caffeine Use: Daily - tea, soda; Financial Concerns: No; Food, Clothing or Shelter Needs: No; Support System Lacking: No; Transportation Concerns: No Electronic Signature(s) Signed: 03/15/2023 10:31:50 AM By: [name] MD [name] ([id number]) [id number] Page 17 of 17 Entered By: [name] on 03/15/2023  10:01:42 -------------------------------------------------------------------------------- SuperBill Details Patient Name: Date of Service: [name] 03/15/2023 Medical Record Number: [id number] Patient Account Number: [id number] Date of Birth/Sex: Treating RN: [birth year] (47 y.o. [name] Primary Care Provider: Dorinda Hill Other Clinician: Referring Provider: Treating Provider/Extender: Jana Hakim in Treatment: 139 Diagnosis Coding ICD-10 Codes Code Description [phone] Non-pressure chronic ulcer of other part of left foot with fat layer exposed Facility Procedures : CPT4 Code: [id number] Description: [phone] - DEBRIDE WOUND 1ST 20 SQ CM OR < ICD-10 Diagnosis Description L97.522 Non-pressure chronic ulcer of other part of left foot with fat layer exposed Modifier: Quantity: 1 Physician Procedures : CPT4 Code Description Modifier [phone] [id number] - WC PHYS DEBR WO ANESTH 20 SQ CM ICD-10 Diagnosis Description L97.522 Non-pressure chronic ulcer of other part of left foot with fat layer exposed Quantity: 1 Electronic Signature(s) Signed: 03/15/2023 10:31:50 AM By: [name]  MD FACS Signed: 04/04/2023 2:03:55 PM By: Brenton Grills Entered By: Brenton Grills on 03/15/2023 09:52:19

## 2023-04-06 ENCOUNTER — Ambulatory Visit (HOSPITAL_BASED_OUTPATIENT_CLINIC_OR_DEPARTMENT_OTHER): Payer: PPO | Admitting: General Surgery

## 2023-04-07 ENCOUNTER — Encounter (HOSPITAL_BASED_OUTPATIENT_CLINIC_OR_DEPARTMENT_OTHER): Payer: PPO | Admitting: General Surgery

## 2023-04-07 DIAGNOSIS — L89623 Pressure ulcer of left heel, stage 3: Secondary | ICD-10-CM | POA: Diagnosis not present

## 2023-04-07 DIAGNOSIS — L97522 Non-pressure chronic ulcer of other part of left foot with fat layer exposed: Secondary | ICD-10-CM | POA: Diagnosis not present

## 2023-04-07 NOTE — Progress Notes (Signed)
[name], [name] ([id number]) [id number] Page 1 of 8 Visit Report for 04/07/2023 Arrival Information Details Patient Name: Date of Service: [name], [name] [name] 04/07/2023 10:00 A M Medical Record Number: [id number] Patient Account Number: [id number] Date of Birth/Sex: Treating RN: 04-Apr-1976 (47 y.o. [name] Primary Care [name]: [name] Other Clinician: Referring [name]: Treating [name]/Extender: [name] in Treatment: 142 Visit Information History Since Last Visit All ordered tests and consults were completed: Yes Patient Arrived: Wheel Chair Added or deleted any medications: No Arrival Time: 10:13 Any new allergies or adverse reactions: No Accompanied By: self Had a fall or experienced change in No Transfer Assistance: None activities of daily living that may affect Patient Requires Transmission-Based Precautions: No risk of falls: Patient Has Alerts: No Signs or symptoms of abuse/neglect since last visito No Hospitalized since last visit: No Implantable device outside of the clinic excluding No cellular tissue based products placed in the center since last visit: Has Dressing in Place as Prescribed: Yes Pain Present Now: No Electronic Signature(s) Signed: 04/07/2023 12:16:03 PM By: [name] Entered By: [name] on 04/07/2023 07:16:17 -------------------------------------------------------------------------------- Encounter Discharge Information Details Patient Name: Date of Service: [name], [name] [name] 04/07/2023 10:00 A M Medical Record Number: [id number] Patient Account Number: [id number] Date of Birth/Sex: Treating RN: March 15, 1976 (47 y.o. [name] Primary Care [name]: [name] Other Clinician: Referring [name]: Treating [name]/Extender: [name] in Treatment: [phone] Encounter Discharge Information Items Post Procedure Vitals Discharge Condition:  Stable Temperature (F): 98.2 Ambulatory Status: Wheelchair Pulse (bpm): 78 Discharge Destination: Home Respiratory Rate (breaths/min): 18 Transportation: Private Auto Blood Pressure (mmHg): 139/65 Accompanied By: family Schedule Follow-up Appointment: Yes Clinical Summary of Care: Patient Declined Electronic Signature(s) Signed: 04/07/2023 12:16:03 PM By: [name] Entered By: [name] on 04/07/2023 07:50:48 [name] ([id number]) [id number] Page 2 of 8 -------------------------------------------------------------------------------- Lower Extremity Assessment Details Patient Name: Date of Service: [name] 04/07/2023 10:00 A M Medical Record Number: [id number] Patient Account Number: [id number] Date of Birth/Sex: Treating RN: October 23, 1975 (47 y.o. [name] Primary Care [name]: [name] Other Clinician: Referring [name]: Treating Aviyon Hocevar/Extender: [name] in Treatment: 142 Edema Assessment Assessed: [name]: No] Franne Forts: No] Edema: [Left: N] [Right: o] Calf Left: Right: Point of Measurement: 29 cm From Medial Instep 43.5 cm Ankle Left: Right: Point of Measurement: 9 cm From Medial Instep 24.4 cm Vascular Assessment Pulses: Dorsalis Pedis Palpable: [Left:Yes] Extremity colors, hair growth, and conditions: Extremity Color: [Left:Normal] Hair Growth on Extremity: [Left:Yes] Temperature of Extremity: [Left:Warm] Capillary Refill: [Left:< 3 seconds] Dependent Rubor: [Left:No No] Toe Nail Assessment Left: Right: Thick: Yes Discolored: Yes Deformed: Yes Improper Length and Hygiene: Yes Electronic Signature(s) Signed: 04/07/2023 12:16:03 PM By: [name] Entered By: [name] on 04/07/2023 07:22:18 -------------------------------------------------------------------------------- Multi Wound Chart Details Patient Name: Date of Service: [name], [name] [place] E. 04/07/2023 10:00 A  M Medical Record Number: [id number] Patient Account Number: [id number] Date of Birth/Sex: Treating RN: 1976/05/05 (47 y.o. M) Primary Care [name]: [name] Other Clinician: Referring [name]: Treating [name]/Extender: [name] in Treatment: 142 Vital Signs Height(in): 69 Pulse(bpm): 91 Weight(lbs): 280 Blood Pressure(mmHg): 137/81 Body Mass Index(BMI): 41.3 [name], [name] ([id number]) [id number] Page 3 of 8 Temperature(F): 99.1 Respiratory Rate(breaths/min): 18 [2:Photos:] [N/A:N/A] Left Calcaneus N/A N/A Wound Location: Pressure Injury N/A N/A Wounding Event: Pressure Ulcer N/A N/A Primary Etiology: Asthma, Angina, Hypertension N/A N/A Comorbid History: 10/07/2019 N/A N/A  Date Acquired: 3 N/A N/A Weeks of Treatment: Open N/A N/A Wound Status: No N/A N/A Wound Recurrence: 3.3x1.3x0.2 N/A N/A Measurements L x W x D (cm) 3.369 N/A N/A A (cm) : rea 0.674 N/A N/A Volume (cm) : 87.60% N/A N/A % Reduction in A rea: 97.50% N/A N/A % Reduction in Volume: Category/Stage III N/A N/A Classification: Medium N/A N/A Exudate A mount: Serosanguineous N/A N/A Exudate Type: red, brown N/A N/A Exudate Color: Distinct, outline attached N/A N/A Wound Margin: Large (67-100%) N/A N/A Granulation A mount: Pink N/A N/A Granulation Quality: Small (1-33%) N/A N/A Necrotic A mount: Fat Layer (Subcutaneous Tissue): Yes N/A N/A Exposed Structures: Fascia: No Tendon: No Muscle: No Joint: No Bone: No Small (1-33%) N/A N/A Epithelialization: Debridement - Selective/Open Wound N/A N/A Debridement: Pre-procedure Verification/Time Out 10:34 N/A N/A Taken: Lidocaine 4% T opical Solution N/A N/A Pain Control: Callus, Slough N/A N/A Tissue Debrided: Non-Viable Tissue N/A N/A Level: 3.37 N/A N/A Debridement A (sq cm): rea Curette N/A N/A Instrument: Minimum N/A N/A Bleeding: Pressure N/A N/A Hemostasis A  chieved: Procedure was tolerated well N/A N/A Debridement Treatment Response: 3.3x1.3x0.2 N/A N/A Post Debridement Measurements L x W x D (cm) 0.674 N/A N/A Post Debridement Volume: (cm) Category/Stage III N/A N/A Post Debridement Stage: Scarring: Yes N/A N/A Periwound Skin Texture: Excoriation: No Induration: No Callus: No Crepitus: No Rash: No Dry/Scaly: Yes N/A N/A Periwound Skin Moisture: Maceration: No Atrophie Blanche: No N/A N/A Periwound Skin Color: Cyanosis: No Ecchymosis: No Erythema: No Hemosiderin Staining: No Mottled: No Pallor: No Rubor: No No Abnormality N/A N/A Temperature: Yes N/A N/A Tenderness on Palpation: Debridement N/A N/A Procedures Performed: Treatment Notes Wound #2 (Calcaneus) Wound Laterality: Left Cleanser Normal Saline KANNIN, QIN ([id number]) [id number] Page 4 of 8 Discharge Instruction: Cleanse the wound with Normal Saline prior [name] applying a clean dressing using gauze sponges, not tissue or cotton balls. Wound Cleanser Discharge Instruction: Cleanse the wound with wound cleanser prior [name] applying a clean dressing using gauze sponges, not tissue or cotton balls. Peri-Wound Care Ketoconazole Cream 2% Discharge Instruction: Apply Ketoconazole mixed with zinc [name] the periwound Zinc Oxide Ointment 30g tube Discharge Instruction: Apply Zinc Oxide [name] periwound with each dressing change Topical Gentamicin Discharge Instruction: apply [name] the wound bed Primary Dressing Maxorb Extra Ag+ Alginate Dressing, 4x4.75 (in/in) Discharge Instruction: Apply [name] wound bed as instructed Secondary Dressing Drawtex 4x4 in Discharge Instruction: Apply over primary dressing as directed. Optifoam Non-Adhesive Dressing, 4x4 in Discharge Instruction: Apply over primary dressing as directed. Woven Gauze Sponge, Non-Sterile 4x4 in Discharge Instruction: Apply over primary dressing as directed. Secured With 50M Medipore H Soft  Cloth Surgical T ape, 4 x 10 (in/yd) Discharge Instruction: Secure with tape as directed. Compression Wrap Kerlix Roll 4.5x3.1 (in/yd) Discharge Instruction: Apply Kerlix and Coban compression as directed. Compression Stockings Add-Ons [place] Vascular Offloading Boot, Size Regular Electronic Signature(s) Signed: 04/07/2023 11:17:56 AM By: [name] MD FACS Entered By: [name] on 04/07/2023 08:17:56 -------------------------------------------------------------------------------- Multi-Disciplinary Care Plan Details Patient Name: Date of Service: [name], [name] [name] 04/07/2023 10:00 A M Medical Record Number: [id number] Patient Account Number: [id number] Date of Birth/Sex: Treating RN: 12-14-1975 (47 y.o. [name] Primary Care Davon Folta: [name] Other Clinician: Referring Inger Wiest: Treating Merikay Lesniewski/Extender: [name] in Treatment: 142 Multidisciplinary Care Plan reviewed with physician Active Inactive Wound/Skin Impairment Nursing Diagnoses: Impaired tissue integrity Knowledge deficit related [name] ulceration/compromised skin integrity [name], [name] ([id number]) [id number] Page 5 of 8  Goals: Patient/caregiver will verbalize understanding of skin care regimen Date Initiated: 07/15/2020 Target Resolution Date: 05/08/2023 Goal Status: Active Ulcer/skin breakdown will have a volume reduction of 30% by week 4 Date Initiated: 07/15/2020 Date Inactivated: 08/20/2020 Target Resolution Date: 09/03/2020 Goal Status: Unmet Unmet Reason: no major changes. Ulcer/skin breakdown will heal within 14 weeks Date Initiated: 12/04/2020 Date Inactivated: 12/10/2020 Target Resolution Date: 12/10/2020 Unmet Reason: wounds still open at 14 Goal Status: Unmet weeks and today 21 weeks. Interventions: Assess patient/caregiver ability [name] obtain necessary supplies Assess patient/caregiver ability [name] perform ulcer/skin care regimen  upon admission and as needed Assess ulceration(s) every visit Provide education on ulcer and skin care Treatment Activities: Skin care regimen initiated : 07/15/2020 Topical wound management initiated : 07/15/2020 Notes: Electronic Signature(s) Signed: 04/07/2023 12:16:03 PM By: [name] Entered By: [name] on 04/07/2023 07:27:08 -------------------------------------------------------------------------------- Pain Assessment Details Patient Name: Date of Service: [name], [name] [place] E. 04/07/2023 10:00 A M Medical Record Number: [id number] Patient Account Number: [id number] Date of Birth/Sex: Treating RN: 1976/04/04 (47 y.o. [name] Primary Care Gracee Ratterree: [name] Other Clinician: Referring Chasitie Passey: Treating March Joos/Extender: [name] in Treatment: [phone] Active Problems Location of Pain Severity and Description of Pain Patient Has Paino No Site Locations Pain Management and Medication Current Pain Management: Electronic Signature(s) Signed: 04/07/2023 12:16:03 PM By: Lorretta Harp, Henrietta Dine ([id number]) [id number] Page 6 of 8 Entered By: [name] on 04/07/2023 07:19:23 -------------------------------------------------------------------------------- Patient/Caregiver Education Details Patient Name: Date of Service: [name] 8/30/2024andnbsp10:00 A M Medical Record Number: [id number] Patient Account Number: [id number] Date of Birth/Gender: Treating RN: 05-27-1976 (47 y.o. [name] Primary Care Physician: [name] Other Clinician: Referring Physician: Treating Physician/Extender: [name] in Treatment: [phone] Education Assessment Education Provided [name]: Patient and Caregiver Education Topics Provided Wound/Skin Impairment: Methods: Explain/Verbal Responses: State content correctly Electronic Signature(s) Signed: 04/07/2023 12:16:03 PM By: [name] Entered By: [name] on 04/07/2023 07:27:27 -------------------------------------------------------------------------------- Wound Assessment Details Patient Name: Date of Service: [name], [name] [name] 04/07/2023 10:00 A M Medical Record Number: [id number] Patient Account Number: [id number] Date of Birth/Sex: Treating RN: 18-Jan-1976 (47 y.o. [name] Primary Care Naava Janeway: [name] Other Clinician: Referring Tomika Eckles: Treating Elidia Bonenfant/Extender: [name] in Treatment: 142 Wound Status Wound Number: 2 Primary Etiology: Pressure Ulcer Wound Location: Left Calcaneus Wound Status: Open Wounding Event: Pressure Injury Comorbid History: Asthma, Angina, Hypertension Date Acquired: 10/07/2019 Weeks Of Treatment: 142 Clustered Wound: No Photos [name], [name] ([id number]) [id number] Page 7 of 8 Wound Measurements Length: (cm) 3.3 Width: (cm) 1.3 Depth: (cm) 0.2 Area: (cm) 3.369 Volume: (cm) 0.674 % Reduction in Area: 87.6% % Reduction in Volume: 97.5% Epithelialization: Small (1-33%) Tunneling: No Undermining: No Wound Description Classification: Category/Stage III Wound Margin: Distinct, outline attached Exudate Amount: Medium Exudate Type: Serosanguineous Exudate Color: red, brown Foul Odor After Cleansing: No Slough/Fibrino No Wound Bed Granulation Amount: Large (67-100%) Exposed Structure Granulation Quality: Pink Fascia Exposed: No Necrotic Amount: Small (1-33%) Fat Layer (Subcutaneous Tissue) Exposed: Yes Necrotic Quality: Adherent Slough Tendon Exposed: No Muscle Exposed: No Joint Exposed: No Bone Exposed: No Periwound Skin Texture Texture Color No Abnormalities Noted: No No Abnormalities Noted: No Callus: No Atrophie Blanche: No Crepitus: No Cyanosis: No Excoriation: No Ecchymosis: No Induration: No Erythema: No Rash: No Hemosiderin Staining: No Scarring: Yes Mottled: No Pallor:  No Moisture Rubor: No No Abnormalities Noted: Yes Temperature / Pain Temperature: No Abnormality Tenderness on Palpation:  Yes Treatment Notes Wound #2 (Calcaneus) Wound Laterality: Left Cleanser Normal Saline Discharge Instruction: Cleanse the wound with Normal Saline prior [name] applying a clean dressing using gauze sponges, not tissue or cotton balls. Wound Cleanser Discharge Instruction: Cleanse the wound with wound cleanser prior [name] applying a clean dressing using gauze sponges, not tissue or cotton balls. Peri-Wound Care Ketoconazole Cream 2% Discharge Instruction: Apply Ketoconazole mixed with zinc [name] the periwound Zinc Oxide Ointment 30g tube Discharge Instruction: Apply Zinc Oxide [name] periwound with each dressing change Topical Gentamicin Discharge Instruction: apply [name] the wound bed Primary Dressing Maxorb Extra Ag+ Alginate Dressing, 4x4.75 (in/in) Discharge Instruction: Apply [name] wound bed as instructed Secondary Dressing Drawtex 4x4 in Discharge Instruction: Apply over primary dressing as directed. Optifoam Non-Adhesive Dressing, 4x4 in Discharge Instruction: Apply over primary dressing as directed. Woven Gauze Sponge, Non-Sterile 4x4 in Discharge Instruction: Apply over primary dressing as directed. Secured With [name], [name] ([id number]) [id number] Page 8 of 8 54M Medipore H Soft Cloth Surgical T ape, 4 x 10 (in/yd) Discharge Instruction: Secure with tape as directed. Compression Wrap Kerlix Roll 4.5x3.1 (in/yd) Discharge Instruction: Apply Kerlix and Coban compression as directed. Compression Stockings Add-Ons [place] Vascular Offloading Boot, Size Regular Electronic Signature(s) Signed: 04/07/2023 12:16:03 PM By: [name] Entered By: [name] on 04/07/2023 07:25:15 -------------------------------------------------------------------------------- Vitals Details Patient Name: Date of Service: [name], [name] [name] 04/07/2023  10:00 A M Medical Record Number: [id number] Patient Account Number: [id number] Date of Birth/Sex: Treating RN: 02-Jun-1976 (47 y.o. [name] Primary Care Lucy Boardman: [name] Other Clinician: Referring Palmyra Rogacki: Treating Elery Cadenhead/Extender: [name] in Treatment: 142 Vital Signs Time Taken: 10:16 Temperature (F): 99.1 Height (in): 69 Pulse (bpm): 91 Weight (lbs): 280 Respiratory Rate (breaths/min): 18 Body Mass Index (BMI): 41.3 Blood Pressure (mmHg): 137/81 Reference Range: 80 - 120 mg / dl Electronic Signature(s) Signed: 04/07/2023 12:16:03 PM By: [name] Entered By: [name] on 04/07/2023 07:18:50

## 2023-04-07 NOTE — Progress Notes (Signed)
MATTIAS, PATNODE ([id number]) [id number] Page 1 of 17 Visit Report for 04/07/2023 Chief Complaint Document Details Patient Name: Date of Service: [name], [name] [name] 04/07/2023 10:00 A M Medical Record Number: [id number] Patient Account Number: [id number] Date of Birth/Sex: Treating RN: [birth year] (47 y.o. M) Primary Care Provider: Dorinda Hill Other Clinician: Referring Provider: Treating Provider/Extender: Jana Hakim in Treatment: 142 Information Obtained from: Patient Chief Complaint Bilateral Plantar Foot Ulcers Electronic Signature(s) Signed: 04/07/2023 11:18:08 AM By: [name] MD FACS Entered By: [name] on 04/07/2023 08:18:08 -------------------------------------------------------------------------------- Debridement Details Patient Name: Date of Service: [name], [name] [name] E. 04/07/2023 10:00 A M Medical Record Number: [id number] Patient Account Number: [id number] Date of Birth/Sex: Treating RN: 1975-09-22 (46 y.o. [name] Primary Care Provider: Dorinda Hill Other Clinician: Referring Provider: Treating Provider/Extender: Jana Hakim in Treatment: 142 Debridement Performed for Assessment: Wound #2 Left Calcaneus Performed By: Physician [name], MD Debridement Type: Debridement Level of Consciousness (Pre-procedure): Awake and Alert Pre-procedure Verification/Time Out Yes - 10:34 Taken: Start Time: 10:35 Pain Control: Lidocaine 4% Topical Solution Percent of Wound Bed Debrided: 100% T Area Debrided (cm): otal 3.37 Tissue and other material debrided: Non-Viable, Callus, Slough, Slough Level: Non-Viable Tissue Debridement Description: Selective/Open Wound Instrument: Curette Bleeding: Minimum Hemostasis Achieved: Pressure Response [name] Treatment: Procedure was tolerated well Level of Consciousness (Post- Awake and Alert procedure): Post Debridement Measurements of  Total Wound Length: (cm) 3.3 Stage: Category/Stage III Width: (cm) 1.3 Depth: (cm) 0.2 Volume: (cm) 0.674 Character of Wound/Ulcer Post Debridement: Improved Post Procedure Diagnosis Same as [name], [name] ([id number]) [id number] Page 2 of 17 Notes Scribed for Dr Lady Gary by Brenton Grills RN Electronic Signature(s) Signed: 04/07/2023 11:20:44 AM By: [name] MD FACS Signed: 04/07/2023 12:16:03 PM By: Brenton Grills Entered By: Brenton Grills on 04/07/2023 07:36:24 -------------------------------------------------------------------------------- HPI Details Patient Name: Date of Service: [name], [name] [name] E. 04/07/2023 10:00 A M Medical Record Number: [id number] Patient Account Number: [id number] Date of Birth/Sex: Treating RN: 05-May-1976 (47 y.o. M) Primary Care Provider: Dorinda Hill Other Clinician: Referring Provider: Treating Provider/Extender: Jana Hakim in Treatment: 142 History of Present Illness HPI Description: Wounds are12/03/2020 upon evaluation today patient presents for initial inspection here in our clinic concerning issues he has been having with the bottoms of his feet bilaterally. He states these actually occurred as wounds when he was hospitalized for 5 months secondary [name] Covid. He was apparently with tilting bed where he was in an upright position quite frequently and apparently this occurred in some way shape or form during that time. Fortunately there is no sign of active infection at this time. No fevers, chills, nausea, vomiting, or diarrhea. With that being said he still has substantial wounds on the plantar aspects of his feet Theragen require quite a bit of work [name] get these [name] heal. He has been using Santyl currently though that is been problematic both in receiving the medication as well as actually paid for it as it is become quite expensive. Prior [name] the experience with Covid the patient  really did not have any major medical problems other than hypertension he does have some mild generalized weakness following the Covid experience. 07/22/2020 on evaluation today patient appears [name] be doing okay in regard [name] his foot ulcers I feel like the wound beds are showing signs of better improvement that I do believe the Iodoflex is helping in this regard. With that being said he does have  a lot of drainage currently and this is somewhat blue/green in nature which is consistent with Pseudomonas. I do think a culture today would be appropriate for [place] [name] evaluate and see if that is indeed the case I would likely start him on antibiotic orally as well he is not allergic [name] Cipro knows of no issues he has had in the past 12/21; patient was admitted [name] the clinic earlier this month with bilateral presumed pressure ulcers on the bottom of his feet apparently related [name] excessive pressure from a tilt table arrangement in the intensive care unit. Patient relates this [name] being on ECMO but I am not really sure that is exactly related [name] that. I must say I have never seen anything like this. He has fairly extensive full-thickness wounds extending from his heel towards his midfoot mostly centered laterally. There is already been some healing distally. He does not appear [name] have an arterial issue. He has been using gentamicin [name] the wound surfaces with Iodoflex [name] help with ongoing debridement 1/6; this is a patient with pressure ulcers on the bottom of his feet related [name] excessive pressure from a standing position in the intensive care unit. He is complaining of a lot of pain in the right heel. He is not a diabetic. 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 We have been using Iodoflex [name] help prepare the surfaces of both wounds for an advanced treatment product. He is nonambulatory spending most of his time in a wheelchair I have asked him not [name] propel the wheelchair with his  heels 1/13; in general his wounds look better not much surface area change we have been using Iodoflex as of last week. I did an x-ray of the right heel as the patient was complaining of pain. I had some thoughts about a stress fracture perhaps Achilles tendon problems however what it showed was erosive changes along the inferior aspect of the calcaneus he now has a MRI booked for 1/20. 1/20; in general his wounds continue [name] be better. Some improvement in the large narrow areas proximally in his foot. He is still complaining of pain in the right heel and tenderness in certain areas of this wound. His MRI is tonight. I am not just looking for osteomyelitis that was brought up on the x-ray I am wondering about stress fractures, tendon ruptures etc. He has no such findings on the left. Also noteworthy is that the patient had critical illness neuropathy and some of the discomfort may be actual improvement in nerve function I am just not sure. These wounds were initially in the setting of severe critical illness related [name] COVID-19. He was put in a standing position. He may have also been on pressors at the point contributing [name] tissue ischemia. By his description at some point these wounds were grossly necrotic extending proximally up into the Achilles part of his heel. I do not know that I have ever really seen pictures of them like this although they may exist in epic We have ordered Tri layer Oasis. I am trying [name] stimulate some granulation in these areas. This is of course assuming the MRI is negative for infection 1/27; since the patient was last here he saw Dr. Earlene Plater of infectious disease. He is planned for vancomycin and ceftriaxone. Prior operative culture grew MSSA. Also ordered baseline lab work. He also ordered arterial studies although the ABIs in our clinic were normal as well as his clinical exam these were normal  I do not think he needs [name] see vascular surgery. His ABIs at the [facility] were  1.22 in the right triphasic waveforms with a normal TBI of 1.15 on the left ABI of 1.22 with triphasic waveforms and a normal TBI of 1.08. Finally he saw Dr. Logan Bores who will follow him in for 2 months. At this point I do not think he felt that he needed a procedure on the right calcaneal bone. Dr. Earlene Plater is elected for broad-spectrum antibiotic The patient is still having pain in the right heel. He walks with a walker 2/3; wounds are generally smaller. He is tolerating his IV antibiotics. I believe this is vancomycin and ceftriaxone. We are still waiting for Oasis burn in terms of his out-of-pocket max which he should be meeting soon given the IV antibiotics, MRIs etc. I have asked him [name] check in on this. We are using silver collagen in the meantime the wounds look better 2/10; tolerating IV vancomycin and Rocephin. We are waiting [name] apply for Oasis. Although I am not really sure where he is in his out-of-pocket max. 2/17 started the first application of Oasis trilayer. Still on antibiotics. The wounds have generally look better. The area on the left has a little more surface slough requiring debridement 2/24; second application of Oasis trilayer. The wound surface granulation is generally look better. The area on the left with undermining laterally I think is come in a bit. [name], [name] ([id number]) [id number] Page 3 of 17 10/08/2020 upon evaluation today patient is here today for [employer] application #3. Fortunately he seems [name] be doing extremely well with regard [name] this and we are seeing a lot of new epithelial growth which is great news. Fortunately there is no signs of active infection at this time. 10/16/2020 upon evaluation today patient appears [name] be doing well with regard [name] his foot ulcers. Do believe the Oasis has been of benefit for him. I do not see any signs of infection right now which is great news and I think that he has a lot of new epithelial  growth which is great [name] see as well. The patient is very pleased [name] hear all of this. I do think we can proceed with the Oasis trilayer #4 today. 3/18; not as much improvement in these areas on his heels that I was hoping. I did reapply trilateral Oasis today the tissue looks healthier but not as much fill in as I was hoping. 3/25; better looking today I think this is come in a bit the tissue looks healthier. Triple layer Oasis reapplied #6 4/1; somewhat better looking definitely better looking surface not as much change in surface area as I was hoping. He may be spending more time [place] on days then he needs [name] although he does have heel offloading boots. Triple layer Oasis reapplied #7 4/7; unfortunately apparently [facility] will not approve any further Oasis which is unfortunate since the patient did respond nicely both in terms of the condition of the wound bed as well as surface area. There is still some drainage coming from the wound but not a lot there does not appear [name] be any infection 4/15; we have been using Hydrofera Blue. He continues [name] have nice rims of epithelialization on the right greater than the left. The left the epithelialization is coming from the tip of his heel. There is moderate drainage. In this that concerns me about a total contact cast. There is no evidence of infection  4/29; patient has been using Hydrofera Blue with dressing changes. He has no complaints or issues today. 5/5; using Hydrofera Blue. I actually think that he looks marginally better than the last time I saw this 3 weeks ago. There are rims of epithelialization on the left thumb coming from the medial [name] on the right. Using Hydrofera Blue 5/12; using Hydrofera Blue. These continue [name] make improvements in surface area. His drainage was not listed as severe I therefore went ahead and put a cast on the left foot. Right foot we will continue [name] dress his previous 5/16; back for first total  contact cast change. He did not tolerate this particularly well cast injury on the anterior tibia among other issues. Difficulty sleeping. I talked him about this in some detail and afterwards is elected [name] continue. I told him I would like [name] have a cast on for 3 weeks [name] see if this is going [name] help at all. I think he agreed 5/19; I think the wound is better. There is no tunneling towards his midfoot. The undermining medially also looks better. He has a rim of new skin distally. I think we are making progress here. The area on the left also continues [name] look somewhat better [name] me using Hydrofera Blue. He has a list of complaints about the cast but none of them seem serious 5/26; patient presents for 1 week follow-up. He has been using a total contact cast and tolerating this well. Hydrofera Blue is the main dressing used. He denies signs of infection. 6/2 Hydrofera Blue total contact cast on the left. These were large ulcers that formed in intensive care unit where the patient was recovering from COVID. May have had something to do with being ventilated in an upright positiono Pressors etc. We have been able [name] get the areas down considerably and a viable surface. There is some epithelialization in both sides. Note made of drainage 6/9; changed [name] [facility] last time because of drainage. He arrives with better looking surfaces and dimensions on the left than the right. Paradoxically the right actually probes more towards his midfoot the left is largely close down but both of these look improved. Using a total contact cast on the left 6/16; complex wounds on his bilateral plantar heels which were initially pressure injury from a stay in the ICU with COVID. We have been using silver alginate most recently. His dimensions of come in quite dramatically however not recently. We have been putting the left foot in a total contact cast 6/23; complex wounds on the bilateral plantar heels. I been putting  the left in the cast paradoxically the area on the right is the one that is going towards closure at a faster rate. Quite a bit of drainage on the left. The patient went [name] see Dr. Logan Bores who said he was going [name] standby for skin grafts. I had actually considered sending him for skin grafts however he would be mandatorily off his feet for a period of weeks [name] months. I am thinking that the area on the right is going [name] close on its own the area on the left has been more stubborn even though we have him in a total contact cast 6/30; took him out of a total contact cast last week is the right heel seem [name] be making better progress than the left where I was placing the cast. We are using silver alginate. Both wounds are smaller right greater than left 7/12; both wounds  look as though they are making some progress. We are using silver alginate. Heel offloading boots 7/26; very gradual progress especially on the right. Using silver alginate. He is wearing heel offloading boots 8/18; he continues [name] close these wounds down very gradually. Using silver alginate. The problem polymen being definitive about this is areas of what appears [name] be callus around the margins. This is not a 100% of the area but certainly sizable especially on the right 9/1; bilateral plantar feet wounds secondary [name] prolonged pressure while being ventilated for COVID-19 in an upright position. Essentially pressure ulcers on the bottom of his feet. He is made substantial progress using silver alginate. 9/14; bilateral plantar feet wounds secondary [name] prolonged pressure. Making progress using silver alginate. 9/29 bilateral plantar feet wounds secondary [name] prolonged pressure. I changed him [name] Iodoflex last week. MolecuLight showing reddened blush fluorescence 10/11; patient presents for follow-up. He has no issues or complaints today. He denies signs of infection. He continues [name] use Iodoflex and antibiotic ointment [name] the wound  beds. 10/27; 2-week follow-up. No evidence of infection. He has callus and thick dry skin around the wound margins we have been using Iodoflex and Bactroban which was in response [name] a moderate left MolecuLight reddish blush fluorescence. 11/10; 2-week follow-up. Wound margins again have thick callus however the measurements of the actual wound sites are a lot smaller. Everything looks reasonably healthy here. We have been using Iodoflex He was approved for prime matrix but I have elected [name] delay this given the improvement in the surface area. Hopefully I will not regret that decision as were getting close [name] the end of the year in terms of insurance payment 12/8; 2-week follow-up. Wounds are generally smaller in size. These were initially substantial wounds extending into the forefoot all the way into the heel on the bilateral plantar feet. They are now both located on the plantar heel distal aspect both of these have a lot of callus around the wounds I used a #5 curette [name] remove this on the right and the left also some subcutaneous debris [name] try and get the wound edges were using Iodoflex. He has heel offloading shoe 12/22; 2-week follow-up. Not really much improvement. He has thick callus around the outer edges of both wounds. I remove this there is some nonviable subcutaneous tissue as well. We have been using Iodoflex. Her intake nurse and myself spontaneously thought of a total contact cast I went back in May. At that time we really were not seeing much of an improvement with a cast although the wound was in a much different situation I would like [name] retry this in 2 weeks and I discussed this with the patient 08/12/2021; the patient has had some improvement with the Iodoflex. The the area on the left heel plantar more improved than the right. I had [name] put him in a total contact cast on the left although I decided [name] put that off for 2 weeks. I am going [name] change his primary dressing [name] silver  collagen. I think in both areas he has had some improvement most of the healing seems [name] be more proximal in the heel. The wounds are in the mid aspect. A lot of thick callus on the right heel however. 1/19; we are using silver collagen on both plantar heel areas. He has had some improvement today. The left did not require any debridement. He still had some eschar on the right that was debrided but both seem [name]  have contracted. I did not put it total contact cast on him today 2/2 we have been using silver collagen. The area on the right plantar heel has areas that appear [name] be epithelialized interspersed with dry flaking callus and dry skin. I removed this. This really looks better than on the other [name] On the left still a large area with raised edges and debris on the surface. 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 He has no issues or complaints today. 2/9; not much change in the left foot wound with 1 week of a cast we are using silver collagen. Silver collagen on the right [name] The right [name] has been the better wound surface. We will reapply the total contact cast on the left 2/16; not much improvement on either [name] I been using silver collagen with a total contact cast on the left. I'm changing the Hydrofera Blue still with a total contact cast on the left 2/23; some improvement on both sides. Disappointing that he has thick callus around the area that we are putting in a total contact cast on the left. We've been using Hydrofera Blue on both wound areas. [name], [name] ([id number]) [id number] Page 4 of 17 This is a man who at essentially pressure ulcers in addition [name] ischemia caused by medications [name] support his blood pressure (pressors) in the ICU. He was being ventilated in the standing position for severe Covid. A Shiley  the wounds extended across his entire foot but are now localized [name] his plantar heels bilaterally. We have made progress however neither areas healed. I continue [name] think the total contact cast is helped albeit painstakingly slowly. He has never wanted a plastic surgery consult although I don't know that they would be interested in grafting in area in this location. 10/07/2021: Continued improvement bilaterally. There is still some callus around the left wound, despite the total contact cast. He has some increased pain in his right midfoot around 1 particular area. This has been painful in the past but seems [name] be a little bit worse. When his cast was removed today, there was an area on the heel of the left foot that looks a bit macerated. He is also complaining of pain in his left thigh and hip which he thinks is secondary [name] the limb length discrepancy caused by the cast. 10/14/2021: He continues [name] improve. A little bit less callus around the left wound. He continues [name] endorse pain in his right midfoot, but this is not as significant as it was last week. The maceration on his left heel is improved. 10/21/2021: Continued improvement [name] both wounds. The maceration on his left heel is no longer evident. Less callus bilaterally. Epithelialization progressing. 10/28/2021: Significant improvement this week. The right sided wound is nearly closed with just a small open area at the middle. No maceration seen on the left heel. Continued epithelialization on both sides. No concern for infection. 11/04/2021: T oday, the wounds were measured a little bit differently and come out as larger, but I actually think they are about the same [name] potentially even smaller, particularly on the left. He continues [name] accumulate some callus on the right. 11/11/2021: T oday, the patient is expressing some concern that the left wound, despite being in the total contact cast, is not progressing at the same rate as the 1 on the right.  He is interested in trying a week without the cast [name] see how the wound does. The wounds are roughly the same size as last week, with the right perhaps being a little bit smaller. He continues [name] build up callus on both sites. 11/18/2021: Last week, I permitted the patient [name] go without his total contact cast, just [name] see if the cast was really making any difference. Today, both wounds have deteriorated [name] some extent, suggesting that the cast is providing benefit, at least on the left. Both are larger and have accumulated callus, slough, and other debris. 11/26/2021: I debrided both wounds quite aggressively last week in an effort [name] stimulate the healing cascade. This appears [name] have been effective as the left sided wound is a full centimeter shorter in length. Although the right was measured slightly larger, on inspection, it looks as though an area of epithelialized tissue was included in the measurements. We have been using PolyMem Ag on the wound surfaces with a total contact cast [name] the left. 12/02/2021: It appears that the intake personnel are including epithelialized tissue in his wound measurements; the right wound is almost completely epithelialized; there is just a crater at the proximal midfoot with some open areas. On the left, he has built up some callus, but the overall wound surface looks good. There is some senescent skin around the wound margin. He has been in PolyMem Ag bilaterally with a total contact cast on the left. 12/09/2021: The right wound is nearly closed; there is just a small open area at the mid calcaneus. On the left, the wound is smaller with minimal callus buildup. No significant drainage. 12/16/2021: The right calcaneal wound remains minimally open at the mid calcaneus; the rest has epithelialized. On the left, the wound is also a little bit smaller. There is some senescent tissue on the periphery. He is getting his first application of a trial skin substitute called  Vendaje today. 12/23/2021: The wound on his right calcaneus is nearly closed; there is just a small area at the most distal aspect of the calcaneus that is open. On the left, the area where we applied [name] the skin substitute has a healthier-looking bed of granulation tissue. The wound dimensions are not significantly different on this [name] but the wound surface is improved. 12/30/2021: The wound on the right calcaneus has not changed significantly aside from some accumulation of callus. On the left, the open area is smaller and continues [name] have an improved surface. He continues [name] accumulate callus around the wound. He is here for his third application of Vendaje. 01/06/2022: The right calcaneal wound is down [name] just a couple of millimeters. He continues [name] accumulate periwound callus. He unfortunately got his cast wet earlier in the week and his left foot is macerated, resulting in some superficial skin loss just distal [name] the open wound. The open wound itself, however, is much smaller and has a healthier appearing surface. He is here for his fourth application of Vendaje. 01/13/2022: The right calcaneal wound is about the same. Unfortunately, once again, his cast got wet and his foot is again macerated. This is caused the left calcaneal wound [name] enlarge. He is here for his fifth application of Vendaje. 01/20/2022: The right calcaneal wound is very small. There is some periwound callus accumulation. He purchased a new cast protector last week and this has been effective in avoiding the maceration that has been occurring on the left. The left calcaneal wound is narrower and has  a healthy and viable-appearing surface. He is here for his 6 application of Vendaje. 01/27/2022: The right calcaneal wound is down [name] just a pinhole. There is some periwound slough and callus. On the left, the wound is narrower and shorter by about a centimeter. The surface is robust and viable-appearing. Unfortunately, the rep for the  trial skin substitute product did not provide any for [place] [name] use today. 02/04/2022: The right calcaneal wound remains unchanged. There is more accumulated callus. On the left, although the intake nurse measured it a little bit longer, it looks about the same [name] me. The surface has a layer of slough, but underneath this, there is good granulation tissue. 02/10/2022: The right calcaneus wound is nearly closed. There is still some callus that builds up around the site. The left [name] looks about the same in terms of dimensions, but the surface is more robust and vital-appearing. 02/16/2022: The area of the right calcaneus that was nearly closed last week has closed, but there is a small opening at the mid foot where it looks like some moisture got retained and caused some reopening. The left foot wound is narrower and shallower. Both sites have a fair amount of periwound callus and eschar. 02/24/2022: The small midfoot opening on the right calcaneus is a little bit smaller today. The left foot wound is narrower and shallower. He continues [name] accumulate periwound callus. No concern for infection. 03/01/2022: The patient came [name] clinic early because he showered and got his cast wet. Fortunately, there is no significant maceration [name] his foot but the callus softened and it looks like the wound on his left calcaneus may be a little bit wider. The wound on his right calcaneus is just a narrow slit. Continued accumulation of periwound callus bilaterally. 03/08/2022: The wound on his right calcaneus is very nearly closed, just a small pinpoint opening under a bit of eschar; the left wound has come in quite a bit, as well. It is narrower and shorter than at our last visit. Still with accumulated callus and eschar bilaterally. 03/17/2022: The right calcaneal wound is healed. The left wound is smaller and the surface itself is very clean, but there is some blue-green staining on the periwound callus, concerning for  Pseudomonas aeruginosa. 03/23/2022: The right calcaneal wound remains closed. The left wound continues [name] contract. No further blue-green staining. Small amount of callus and slough accumulation. 03/28/2022: He came in early today because he had gotten his cast wet. On inspection, the wound itself did not get wet or macerated, just a little bit of the forefoot. The wound itself is basically unchanged. [name], [name] ([id number]) [id number] Page 5 of 17 04/07/2022: The right foot wound remains closed. The left wound is the smallest that I have seen it [name] date. It is narrower and shorter. It still continues [name] accumulate slough on the surface. 04/15/2022: There is a band of epithelium now dividing the small left plantar foot wound in 2. There is still some slough on the surface. 04/21/2022: The wound continues [name] narrow. Just a little bit of slough on the surface. He seems [name] be responding well [name] endoform. 04/28/2022: Continued slow contraction of the wound. There is a little slough on the surface and some periwound callus. We have been using endoform and total contact cast. 05/05/2022: The wound appears [name] have stalled. There is slough and some periwound eschar/callus. No concern for infection, however. 05/12/2022: Unfortunately, his right foot has reopened. It is located at the most  posterior aspect of his surgical incision. The area was noted [name] have drainage coming from it when his padding was removed today. Underneath some callus and senescent skin, there is an opening. No purulent drainage or malodor. On the left foot, the wound is again unchanged. There is some light blue staining on the callus, but no malodor or purulent drainage. 10/13; right and left heel remanence of extensive plantar foot wounds. These are better than I remember by quite a big margin however he is still left with wounds on the left plantar heel and the right plantar heel. Been using endoform  bilaterally. A culture was done that showed apparently Pseudomonas but we are still waiting for the [place] [facility] antibiotic [name] use gentamicin today. He is still very active by description I am not sure about the offloading of his noncasted right foot 10/20; both wounds right and left heel debrided not much change from last week. [name] Echevaria has arrived which is linezolid, gentamicin and ciprofloxacin we will use this with endoform. T contact cast on the left otal 06/02/2022: Both wounds are smaller today. There is still a fair amount of callus buildup around the right foot ulcer. The left is more superficial and nearly flush with the surrounding tissues. Also with slough and eschar buildup. 06/10/2022: The right sided wound appears [name] be nearly closed, if not completely so, although it is somewhat difficult [name] tell given the abnormal tissue and scarring in his foot. There is a fair amount of callus and crust accumulation. On the left, the wound looks about the same, again with callus and slough. He has an appointment next Thursday with Dr. Annamary Rummage in podiatry; I am hopeful that there may be some reconstructive options available for Mr. Sicotte. 06/16/2022: Both wounds have some eschar and callus accumulation. The right sided wound is extremely narrow and barely open; the left is narrower than last week. There is a little bit of slough. He has his appointment in podiatry later today. 06/23/2022: The patient met with Dr. Annamary Rummage last week and unfortunately, there are no reconstructive options that he believes would be helpful. He did order an MRI [name] evaluate for osteomyelitis and fortunately, none was seen. The left sided wound is a little bit shorter and narrower today. The right sided wound is about the same. There is callus and eschar accumulation bilaterally. 06/29/2022: Both feet have improved from last week. There is epithelium making a valiant effort [name] creep across the surface on the left. The  right [name] looks like it got a little dry and the deep crevasse in his midfoot has cracked. Both have eschar and there is some slough on the left. 07/07/2022: Both feet have improved. There is epithelium completely covering the calcaneus on the right with just a small opening in the crevasse in his midfoot. On the left, the open area of tissue is smaller but he continues [name] build up callus/eschar and slough. 07/15/2022: The opening in the midfoot on the right is about the same size, covered with eschar and a little bit of slough. The open portion of the left wound is narrower and shorter with a bit of slough buildup. He admits [name] being on his feet more than recommended. 12/14; as far as I can tell everything on the right foot is closed. There is some eschar I removed some of this I cannot identify any open wound here. As usual this will be a very vulnerable area going forward. On the left this looks really quite healthy.  I was pleasantly surprised [name] see how good this looked. Wound is certainly smaller and there appears [name] be healthy epithelialization. He has been using Promogran on the right and endoform on the left. He has been offloading the right foot with a heel offloading boot and he has a running shoe on the right foot 08/04/2022: The right foot remains closed. He has a thick cushioned insole in his sneaker. The left sided wound is smaller with just some slough and eschar accumulation. He is wearing the heel off loader on this foot. 08/15/2022: The right foot remains closed. The left wound has narrowed further. There is some slough and eschar accumulation. 08/25/2022: We put him in a peg assist shoe insert and as a result, he has more epithelialization of the ulcer with minimal slough and eschar accumulation. 09/01/2022: The wound is smaller by about half this week. Still with some slough on the surface. The peg assist shoe seems [name] be doing a remarkable job of adequately offloading the  site. 09/08/2022: There is a little bit more epithelium coming in. There is some slough and callus buildup. 09/16/2022: The wound measures about the same size, but the epithelium that has grown in looks more robust and stronger. There is some slough and callus buildup. 09/23/2022: The wound remains about the same size. The skin edges are looking rather senescent. 09/29/2022: The aggressive debridement I performed last week seems [name] have been effective. The wound is smaller and has significantly less slough accumulation. 10/06/2022: There has been quite a bit of epithelialization since last week. There are still some open areas with slough accumulation. There is some callus buildup around the perimeter. 10/13/2022: Continued improvement. Minimal callus accumulation. 3/14; patient presents for follow-up. He has been using endoform [name] the wound bed without issues. He is using a surgical shoe with peg assist for offloading. 10/27/2022: The wound dimensions are stable. There is some senescent skin accumulation around the perimeter. 11/04/2022: Last week I performed a very aggressive debridement in an effort [name] stimulate the healing cascade. As has been the case when I have done this before, the wound has responded well. There has been epithelialization and contraction of the wound. There are just a couple of small open areas. 11/10/2022: Unfortunately, his foot got wet secondary [name] sweating and there has been some breakdown of the tissue, particularly the skin just adjacent [name] the main ulcer. 11/18/2022: We continue [name] struggle with moisture-related tissue breakdown around the perimeter of his wound. This has caused the thin epithelium that had formed on the surface [name] disintegrate. The underlying surface of the wound has good granulation tissue. [name], [name] ([id number]) [id number] Page 6 of 17 11/25/2022: The edges of the wound are much less macerated, but the surface is actually  looking a little bit dry. There is slough accumulation. No obvious signs of infection. 12/01/2022: The wound edges are more macerated and broken down, making the wound larger. The surface has some slough on it. 12/08/2020: The wound looks better this week. There is significantly less maceration and moisture-related tissue breakdown. There is some slough on the wound surface. 12/15/2022: The wound continues [name] improve. There is almost no moisture-related tissue breakdown. There is epithelium beginning [name] fill in again from the edges. Light slough on the wound surface. 12/22/2022: More epithelium has filled him from around the edges. There is some slough on the surface. No evidence of moisture-related tissue breakdown. 01/05/2023: There has been more epithelialization, particularly at the posterior calcaneal aspect of  the wound. There has been a little bit of moisture accumulation with minimal maceration. 01/12/2023: More epithelium has filled in. There is very minimal accumulation of slough. 01/20/2023: The wound is measuring a little bit smaller today. It did measure deeper, but this appears [name] be secondary [name] some heaped up senescent skin around the edges. The epithelium that has been present at the posterior aspect of the wound seems [name] be a bit more robust with greater integrity. 01/26/2023: He reports intense itching [name] the skin around the wound and there has been some breakdown here. It looks fungal in nature. The wound itself looks pretty good with improved epithelialization with just some slough buildup. He has a little callus along the wound edges. 02/02/2023: He has been treating the periwound skin with an over-the-counter athlete's foot medication and notes significant improvement in his pruritus. The skin looks better here, as well. The wound continues [name] epithelialize. There is light slough accumulation. 02/10/2023: The periwound skin still looks fairly angry. He reports multiple small blisters that  have opened up. He is not having the intense itching that he had prior, however. There has been more epithelialization at the calcaneus and there is minimal slough accumulation. 02/16/2023: The periwound skin is improving. An online review of dermatology rash images suggest that this may be hand-foot-and-mouth disease, isolated [name] the feet; the patient says he actually had this as a child. The calcaneus continues [name] epithelialize and the tissue was quite robust here. Very minimal slough accumulation on the open portion of the wound with a little bit of periwound callus buildup. 03/03/2023: The wound measurements are about the same, but there is more epithelium making its way [name] the surface. There is a little bit of crust and eschar around the edges and minimal slough on the surface. 03/09/2023: His dressing was applied rather haphazardly and the drawtex was not applied. The Optifoam was also reversed such that the orange [name] was in contact with his bare skin. He has some maceration around the wound edges, but this did not appear [name] result in any tissue breakdown. There is a little bit of slough on the surface and minimal eschar around the edges. 03/15/2023: The wound measured narrower today. There is some slough accumulation on the surface and some eschar around the edges. 03/23/2023: The length of the wound remains stable, but it continues [name] narrow. The skin that has covered the posterior aspect of the calcaneus is more robust and has better tensile integrity. Minimal slough and eschar accumulation. 03/30/2023: The wound length has decreased. This seems [name] be primarily due [name] more epithelium covering the posterior aspect of the site. Minimal slough and eschar buildup. 04/07/2023: The wound measured a little bit smaller again today. There is minimal slough accumulation with just a little bit of eschar and callus around the edges. Electronic Signature(s) Signed: 04/07/2023 11:18:43 AM By: [name] MD  FACS Entered By: [name] on 04/07/2023 08:18:43 -------------------------------------------------------------------------------- Physical Exam Details Patient Name: Date of Service: [name], [name] [name] 04/07/2023 10:00 A M Medical Record Number: [id number] Patient Account Number: [id number] Date of Birth/Sex: Treating RN: 1976/04/16 (47 y.o. M) Primary Care Provider: Dorinda Hill Other Clinician: Referring Provider: Treating Provider/Extender: Jana Hakim in Treatment: 142 Constitutional . . . . no acute distress. Respiratory Normal work of breathing on room air. Notes 04/07/2023: The wound measured a little bit smaller again today. There is minimal slough accumulation with just a little bit of eschar and callus around  the edges. Electronic Signature(s) Signed: 04/07/2023 [name], [name] [name] AM By: [name] MD FACS SignedBea [name] ([id number]) [id number] Page 7 of 17 Entered By: [name] on 04/07/2023 08:19:19 -------------------------------------------------------------------------------- Physician Orders Details Patient Name: Date of Service: [name] 04/07/2023 10:00 A M Medical Record Number: [id number] Patient Account Number: [id number] Date of Birth/Sex: Treating RN: 1975/10/08 (47 y.o. [name] Primary Care Provider: Dorinda Hill Other Clinician: Referring Provider: Treating Provider/Extender: Jana Hakim in Treatment: [phone] Verbal / Phone Orders: No Diagnosis Coding ICD-10 Coding Code Description [phone] Non-pressure chronic ulcer of other part of left foot with fat layer exposed Follow-up Appointments ppointment in 1 week. - Dr. Lady Gary Room 3 Return A Anesthetic Wound #2 Left Calcaneus (In clinic) Topical Lidocaine 4% applied [name] wound bed - In clinic Bathing/ Shower/ Hygiene May shower and wash wound with soap  and water. Edema Control - Lymphedema / SCD / Other Bilateral Lower Extremities Avoid standing for long periods of time. Moisturize legs daily. - as needed Off-Loading Other: - keep pressure off of the bottom of your feet. Elevate legs throughout the day. Use the Shoe with the PegAssist off-loading insole Additional Orders / Instructions Follow Nutritious Diet - Try [name] get 70-100 grams of Protein a day+ Wound Treatment Wound #2 - Calcaneus Wound Laterality: Left Cleanser: Normal Saline (Generic) 1 x Per Day/30 Days Discharge Instructions: Cleanse the wound with Normal Saline prior [name] applying a clean dressing using gauze sponges, not tissue or cotton balls. Cleanser: Wound Cleanser (Generic) 1 x Per Day/30 Days Discharge Instructions: Cleanse the wound with wound cleanser prior [name] applying a clean dressing using gauze sponges, not tissue or cotton balls. Peri-Wound Care: Ketoconazole Cream 2% 1 x Per Day/30 Days Discharge Instructions: Apply Ketoconazole mixed with zinc [name] the periwound Peri-Wound Care: Zinc Oxide Ointment 30g tube 1 x Per Day/30 Days Discharge Instructions: Apply Zinc Oxide [name] periwound with each dressing change Topical: Gentamicin (Generic) 1 x Per Day/30 Days Discharge Instructions: apply [name] the wound bed Prim Dressing: Maxorb Extra Ag+ Alginate Dressing, 4x4.75 (in/in) (Generic) 1 x Per Day/30 Days ary Discharge Instructions: Apply [name] wound bed as instructed Secondary Dressing: Drawtex 4x4 in (Generic) 1 x Per Day/30 Days Discharge Instructions: Apply over primary dressing as directed. Secondary Dressing: Optifoam Non-Adhesive Dressing, 4x4 in (Generic) 1 x Per Day/30 Days Discharge Instructions: Apply over primary dressing as directed. Secondary Dressing: Woven Gauze Sponge, Non-Sterile 4x4 in (Generic) 1 x Per Day/30 Days NOYAN, TOLEDO ([id number]) [id number] Page 8 of 17 Discharge Instructions: Apply over primary dressing as  directed. Secured With: 72M Medipore H Soft Cloth Surgical T ape, 4 x 10 (in/yd) (Generic) 1 x Per Day/30 Days Discharge Instructions: Secure with tape as directed. Compression Wrap: Kerlix Roll 4.5x3.1 (in/yd) (Generic) 1 x Per Day/30 Days Discharge Instructions: Apply Kerlix and Coban compression as directed. Add-Ons: [employer] Offloading Boot, Size Regular 1 x Per Day/30 Days Electronic Signature(s) Signed: 04/07/2023 11:20:44 AM By: [name] MD FACS Entered By: [name] on 04/07/2023 08:19:32 -------------------------------------------------------------------------------- Problem List Details Patient Name: Date of Service: [name], [name] [name] 04/07/2023 10:00 A M Medical Record Number: [id number] Patient Account Number: [id number] Date of Birth/Sex: Treating RN: Jun 13, 1976 (47 y.o. [name] Primary Care Provider: Dorinda Hill Other Clinician: Referring Provider: Treating Provider/Extender: Jana Hakim in Treatment: [phone] Active Problems ICD-10 Encounter Code Description Active Date MDM Diagnosis [phone] Non-pressure chronic ulcer  of other part of left foot with fat layer exposed 09/03/2020 No Yes Inactive Problems ICD-10 Code Description Active Date Inactive Date L97.512 Non-pressure chronic ulcer of other part of right foot with fat layer exposed 09/03/2020 09/03/2020 L89.893 Pressure ulcer of other site, stage 3 07/15/2020 07/15/2020 M62.81 Muscle weakness (generalized) 07/15/2020 07/15/2020 I10 Essential (primary) hypertension 07/15/2020 07/15/2020 M86.171 Other acute osteomyelitis, right ankle and foot 09/03/2020 09/03/2020 Resolved Problems Electronic Signature(s) Signed: 04/07/2023 11:17:49 AM By: [name] MD FACS Entered By: [name] on 04/07/2023 08:17:49 [name] ([id number]) [id number] Page 9 of  17 -------------------------------------------------------------------------------- Progress Note Details Patient Name: Date of Service: [name], [name] [name] 04/07/2023 10:00 A M Medical Record Number: [id number] Patient Account Number: [id number] Date of Birth/Sex: Treating RN: February 14, 1976 (47 y.o. M) Primary Care Provider: Dorinda Hill Other Clinician: Referring Provider: Treating Provider/Extender: Jana Hakim in Treatment: 142 Subjective Chief Complaint Information obtained from Patient Bilateral Plantar Foot Ulcers History of Present Illness (HPI) Wounds are12/03/2020 upon evaluation today patient presents for initial inspection here in our clinic concerning issues he has been having with the bottoms of his feet bilaterally. He states these actually occurred as wounds when he was hospitalized for 5 months secondary [name] Covid. He was apparently with tilting bed where he was in an upright position quite frequently and apparently this occurred in some way shape or form during that time. Fortunately there is no sign of active infection at this time. No fevers, chills, nausea, vomiting, or diarrhea. With that being said he still has substantial wounds on the plantar aspects of his feet Theragen require quite a bit of work [name] get these [name] heal. He has been using Santyl currently though that is been problematic both in receiving the medication as well as actually paid for it as it is become quite expensive. Prior [name] the experience with Covid the patient really did not have any major medical problems other than hypertension he does have some mild generalized weakness following the Covid experience. 07/22/2020 on evaluation today patient appears [name] be doing okay in regard [name] his foot ulcers I feel like the wound beds are showing signs of better improvement that I do believe the Iodoflex is helping in this regard. With that being said he does have a lot of drainage currently and  this is somewhat blue/green in nature which is consistent with Pseudomonas. I do think a culture today would be appropriate for [place] [name] evaluate and see if that is indeed the case I would likely start him on antibiotic orally as well he is not allergic [name] Cipro knows of no issues he has had in the past 12/21; patient was admitted [name] the clinic earlier this month with bilateral presumed pressure ulcers on the bottom of his feet apparently related [name] excessive pressure from a tilt table arrangement in the intensive care unit. Patient relates this [name] being on ECMO but I am not really sure that is exactly related [name] that. I must say I have never seen anything like this. He has fairly extensive full-thickness wounds extending from his heel towards his midfoot mostly centered laterally. There is already been some healing distally. He does not appear [name] have an arterial issue. He has been using gentamicin [name] the wound surfaces with Iodoflex [name] help with ongoing debridement 1/6; this is a patient with pressure ulcers on the bottom of his feet related [name] excessive pressure from a standing position in the intensive care unit. He is  complaining of a lot of pain in the right heel. He is not a diabetic. 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 We have been using Iodoflex [name] help prepare the surfaces of both wounds for an advanced treatment product. He is nonambulatory spending most of his time in a wheelchair I have asked him not [name] propel the wheelchair with his heels 1/13; in general his wounds look better not much surface area change we have been using Iodoflex as of last week. I did an x-ray of the right heel as the patient was complaining of pain. I had some thoughts about a stress fracture perhaps Achilles tendon problems however what it showed was erosive changes along the inferior aspect of the calcaneus he now has a MRI booked for 1/20. 1/20; in general his wounds continue [name] be  better. Some improvement in the large narrow areas proximally in his foot. He is still complaining of pain in the right heel and tenderness in certain areas of this wound. His MRI is tonight. I am not just looking for osteomyelitis that was brought up on the x-ray I am wondering about stress fractures, tendon ruptures etc. He has no such findings on the left. Also noteworthy is that the patient had critical illness neuropathy and some of the discomfort may be actual improvement in nerve function I am just not sure. These wounds were initially in the setting of severe critical illness related [name] COVID-19. He was put in a standing position. He may have also been on pressors at the point contributing [name] tissue ischemia. By his description at some point these wounds were grossly necrotic extending proximally up into the Achilles part of his heel. I do not know that I have ever really seen pictures of them like this although they may exist in epic We have ordered Tri layer Oasis. I am trying [name] stimulate some granulation in these areas. This is of course assuming the MRI is negative for infection 1/27; since the patient was last here he saw Dr. Earlene Plater of infectious disease. He is planned for vancomycin and ceftriaxone. Prior operative culture grew MSSA. Also ordered baseline lab work. He also ordered arterial studies although the ABIs in our clinic were normal as well as his clinical exam these were normal I do not think he needs [name] see vascular surgery. His ABIs at the [facility] were 1.22 in the right triphasic waveforms with a normal TBI of 1.15 on the left ABI of 1.22 with triphasic waveforms and a normal TBI of 1.08. Finally he saw Dr. Logan Bores who will follow him in for 2 months. At this point I do not think he felt that he needed a procedure on the right calcaneal bone. Dr. Earlene Plater is elected for broad-spectrum antibiotic The patient is still having pain in the right heel. He walks with a walker 2/3; wounds  are generally smaller. He is tolerating his IV antibiotics. I believe this is vancomycin and ceftriaxone. We are still waiting for Oasis burn in terms of his out-of-pocket max which he should be meeting soon given the IV antibiotics, MRIs etc. I have asked him [name] check in on this. We are using silver collagen in the meantime the wounds look better 2/10; tolerating IV vancomycin and Rocephin. We are waiting [name] apply for Oasis. Although I am not really sure where he is in his out-of-pocket max. 2/17 started the first application of Oasis trilayer. Still on antibiotics. The wounds have generally look better. The  area on the left has a little more surface slough requiring debridement 2/24; second application of Oasis trilayer. The wound surface granulation is generally look better. The area on the left with undermining laterally I think is come in a bit. 10/08/2020 upon evaluation today patient is here today for [employer] application #3. Fortunately he seems [name] be doing extremely well with regard [name] this and we are seeing a lot of new epithelial growth which is great news. Fortunately there is no signs of active infection at this time. 10/16/2020 upon evaluation today patient appears [name] be doing well with regard [name] his foot ulcers. Do believe the Oasis has been of benefit for him. I do not see any signs of infection right now which is great news and I think that he has a lot of new epithelial growth which is great [name] see as well. The patient is very pleased [name] hear all of this. I do think we can proceed with the Oasis trilayer #4 today. 3/18; not as much improvement in these areas on his heels that I was hoping. I did reapply trilateral Oasis today the tissue looks healthier but not as much fill HENSON, SWINEFORD ([id number]) [id number] Page 10 of 17 in as I was hoping. 3/25; better looking today I think this is come in a bit the tissue looks healthier. Triple layer Oasis  reapplied #6 4/1; somewhat better looking definitely better looking surface not as much change in surface area as I was hoping. He may be spending more time [place] on days then he needs [name] although he does have heel offloading boots. Triple layer Oasis reapplied #7 4/7; unfortunately apparently [facility] will not approve any further Oasis which is unfortunate since the patient did respond nicely both in terms of the condition of the wound bed as well as surface area. There is still some drainage coming from the wound but not a lot there does not appear [name] be any infection 4/15; we have been using Hydrofera Blue. He continues [name] have nice rims of epithelialization on the right greater than the left. The left the epithelialization is coming from the tip of his heel. There is moderate drainage. In this that concerns me about a total contact cast. There is no evidence of infection 4/29; patient has been using Hydrofera Blue with dressing changes. He has no complaints or issues today. 5/5; using Hydrofera Blue. I actually think that he looks marginally better than the last time I saw this 3 weeks ago. There are rims of epithelialization on the left thumb coming from the medial [name] on the right. Using Hydrofera Blue 5/12; using Hydrofera Blue. These continue [name] make improvements in surface area. His drainage was not listed as severe I therefore went ahead and put a cast on the left foot. Right foot we will continue [name] dress his previous 5/16; back for first total contact cast change. He did not tolerate this particularly well cast injury on the anterior tibia among other issues. Difficulty sleeping. I talked him about this in some detail and afterwards is elected [name] continue. I told him I would like [name] have a cast on for 3 weeks [name] see if this is going [name] help at all. I think he agreed 5/19; I think the wound is better. There is no tunneling towards his midfoot. The undermining medially also  looks better. He has a rim of new skin distally. I think we are making progress here. The area on  the left also continues [name] look somewhat better [name] me using Hydrofera Blue. He has a list of complaints about the cast but none of them seem serious 5/26; patient presents for 1 week follow-up. He has been using a total contact cast and tolerating this well. Hydrofera Blue is the main dressing used. He denies signs of infection. 6/2 Hydrofera Blue total contact cast on the left. These were large ulcers that formed in intensive care unit where the patient was recovering from COVID. May have had something to do with being ventilated in an upright positiono Pressors etc. We have been able [name] get the areas down considerably and a viable surface. There is some epithelialization in both sides. Note made of drainage 6/9; changed [name] [facility] last time because of drainage. He arrives with better looking surfaces and dimensions on the left than the right. Paradoxically the right actually probes more towards his midfoot the left is largely close down but both of these look improved. Using a total contact cast on the left 6/16; complex wounds on his bilateral plantar heels which were initially pressure injury from a stay in the ICU with COVID. We have been using silver alginate most recently. His dimensions of come in quite dramatically however not recently. We have been putting the left foot in a total contact cast 6/23; complex wounds on the bilateral plantar heels. I been putting the left in the cast paradoxically the area on the right is the one that is going towards closure at a faster rate. Quite a bit of drainage on the left. The patient went [name] see Dr. Logan Bores who said he was going [name] standby for skin grafts. I had actually considered sending him for skin grafts however he would be mandatorily off his feet for a period of weeks [name] months. I am thinking that the area on the right is going [name] close on its  own the area on the left has been more stubborn even though we have him in a total contact cast 6/30; took him out of a total contact cast last week is the right heel seem [name] be making better progress than the left where I was placing the cast. We are using silver alginate. Both wounds are smaller right greater than left 7/12; both wounds look as though they are making some progress. We are using silver alginate. Heel offloading boots 7/26; very gradual progress especially on the right. Using silver alginate. He is wearing heel offloading boots 8/18; he continues [name] close these wounds down very gradually. Using silver alginate. The problem polymen being definitive about this is areas of what appears [name] be callus around the margins. This is not a 100% of the area but certainly sizable especially on the right 9/1; bilateral plantar feet wounds secondary [name] prolonged pressure while being ventilated for COVID-19 in an upright position. Essentially pressure ulcers on the bottom of his feet. He is made substantial progress using silver alginate. 9/14; bilateral plantar feet wounds secondary [name] prolonged pressure. Making progress using silver alginate. 9/29 bilateral plantar feet wounds secondary [name] prolonged pressure. I changed him [name] Iodoflex last week. MolecuLight showing reddened blush fluorescence 10/11; patient presents for follow-up. He has no issues or complaints today. He denies signs of infection. He continues [name] use Iodoflex and antibiotic ointment [name] the wound beds. 10/27; 2-week follow-up. No evidence of infection. He has callus and thick dry skin around the wound margins we have been using Iodoflex and Bactroban which was in  response [name] a moderate left MolecuLight reddish blush fluorescence. 11/10; 2-week follow-up. Wound margins again have thick callus however the measurements of the actual wound sites are a lot smaller. Everything looks reasonably healthy here. We have been using  Iodoflex He was approved for prime matrix but I have elected [name] delay this given the improvement in the surface area. Hopefully I will not regret that decision as were getting close [name] the end of the year in terms of insurance payment 12/8; 2-week follow-up. Wounds are generally smaller in size. These were initially substantial wounds extending into the forefoot all the way into the heel on the bilateral plantar feet. They are now both located on the plantar heel distal aspect both of these have a lot of callus around the wounds I used a #5 curette [name] remove this on the right and the left also some subcutaneous debris [name] try and get the wound edges were using Iodoflex. He has heel offloading shoe 12/22; 2-week follow-up. Not really much improvement. He has thick callus around the outer edges of both wounds. I remove this there is some nonviable subcutaneous tissue as well. We have been using Iodoflex. Her intake nurse and myself spontaneously thought of a total contact cast I went back in May. At that time we really were not seeing much of an improvement with a cast although the wound was in a much different situation I would like [name] retry this in 2 weeks and I discussed this with the patient 08/12/2021; the patient has had some improvement with the Iodoflex. The the area on the left heel plantar more improved than the right. I had [name] put him in a total contact cast on the left although I decided [name] put that off for 2 weeks. I am going [name] change his primary dressing [name] silver collagen. I think in both areas he has had some improvement most of the healing seems [name] be more proximal in the heel. The wounds are in the mid aspect. A lot of thick callus on the right heel however. 1/19; we are using silver collagen on both plantar heel areas. He has had some improvement today. The left did not require any debridement. He still had some eschar on the right that was debrided but both seem [name] have contracted. I  did not put it total contact cast on him today 2/2 we have been using silver collagen. The area on the right plantar heel has areas that appear [name] be epithelialized interspersed with dry flaking callus and dry skin. I removed this. This really looks better than on the other [name] On the left still a large area with raised edges and debris on the surface. 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 He has no issues or complaints today. 2/9; not much change in the left foot wound with 1 week of a cast we are using silver collagen. Silver collagen on the right [name] The right [name] has been the better wound surface. We will reapply the total contact cast on the left 2/16; not much improvement on either [name] I been using silver collagen with a total contact cast on the left. I'm changing the Hydrofera Blue still with a total contact cast on the left 2/23; some improvement on both sides. Disappointing that he has thick callus around the area that we are putting in  a total contact cast on the left. We've been using Hydrofera Blue on both wound areas. This is a man who at essentially pressure ulcers in addition [name] ischemia caused by medications [name] support his blood pressure (pressors) in the ICU. He was being ventilated in the standing position for severe Covid. A Shiley the wounds extended across his entire foot but are now localized [name] his plantar heels bilaterally. We have made progress however neither areas healed. I continue [name] think the total contact cast is helped albeit painstakingly slowly. He has never wanted a plastic surgery consult although I don't know that they would be interested in grafting in area in this location. 10/07/2021: Continued improvement bilaterally. There is still some callus around the left wound, despite the total contact cast. He has some increased  pain in his right midfoot around 1 particular area. This has been painful in the past but seems [name] be a little bit worse. When his cast was removed today, there was an area on the heel of the left foot that looks a bit macerated. He is also complaining of pain in his left thigh and hip which he thinks is secondary [name] the limb length BRINLEY, [name] ([id number]) [id number] Page 11 of 17 discrepancy caused by the cast. 10/14/2021: He continues [name] improve. A little bit less callus around the left wound. He continues [name] endorse pain in his right midfoot, but this is not as significant as it was last week. The maceration on his left heel is improved. 10/21/2021: Continued improvement [name] both wounds. The maceration on his left heel is no longer evident. Less callus bilaterally. Epithelialization progressing. 10/28/2021: Significant improvement this week. The right sided wound is nearly closed with just a small open area at the middle. No maceration seen on the left heel. Continued epithelialization on both sides. No concern for infection. 11/04/2021: T oday, the wounds were measured a little bit differently and come out as larger, but I actually think they are about the same [name] potentially even smaller, particularly on the left. He continues [name] accumulate some callus on the right. 11/11/2021: T oday, the patient is expressing some concern that the left wound, despite being in the total contact cast, is not progressing at the same rate as the 1 on the right. He is interested in trying a week without the cast [name] see how the wound does. The wounds are roughly the same size as last week, with the right perhaps being a little bit smaller. He continues [name] build up callus on both sites. 11/18/2021: Last week, I permitted the patient [name] go without his total contact cast, just [name] see if the cast was really making any difference. Today, both wounds have deteriorated [name] some extent, suggesting  that the cast is providing benefit, at least on the left. Both are larger and have accumulated callus, slough, and other debris. 11/26/2021: I debrided both wounds quite aggressively last week in an effort [name] stimulate the healing cascade. This appears [name] have been effective as the left sided wound is a full centimeter shorter in length. Although the right was measured slightly larger, on inspection, it looks as though an area of epithelialized tissue was included in the measurements. We have been using PolyMem Ag on the wound surfaces with a total contact cast [name] the left. 12/02/2021: It appears that the intake personnel are including epithelialized tissue in his wound measurements; the right wound is almost completely epithelialized; there is just a crater at the  proximal midfoot with some open areas. On the left, he has built up some callus, but the overall wound surface looks good. There is some senescent skin around the wound margin. He has been in PolyMem Ag bilaterally with a total contact cast on the left. 12/09/2021: The right wound is nearly closed; there is just a small open area at the mid calcaneus. On the left, the wound is smaller with minimal callus buildup. No significant drainage. 12/16/2021: The right calcaneal wound remains minimally open at the mid calcaneus; the rest has epithelialized. On the left, the wound is also a little bit smaller. There is some senescent tissue on the periphery. He is getting his first application of a trial skin substitute called Vendaje today. 12/23/2021: The wound on his right calcaneus is nearly closed; there is just a small area at the most distal aspect of the calcaneus that is open. On the left, the area where we applied [name] the skin substitute has a healthier-looking bed of granulation tissue. The wound dimensions are not significantly different on this [name] but the wound surface is improved. 12/30/2021: The wound on the right calcaneus has not changed  significantly aside from some accumulation of callus. On the left, the open area is smaller and continues [name] have an improved surface. He continues [name] accumulate callus around the wound. He is here for his third application of Vendaje. 01/06/2022: The right calcaneal wound is down [name] just a couple of millimeters. He continues [name] accumulate periwound callus. He unfortunately got his cast wet earlier in the week and his left foot is macerated, resulting in some superficial skin loss just distal [name] the open wound. The open wound itself, however, is much smaller and has a healthier appearing surface. He is here for his fourth application of Vendaje. 01/13/2022: The right calcaneal wound is about the same. Unfortunately, once again, his cast got wet and his foot is again macerated. This is caused the left calcaneal wound [name] enlarge. He is here for his fifth application of Vendaje. 01/20/2022: The right calcaneal wound is very small. There is some periwound callus accumulation. He purchased a new cast protector last week and this has been effective in avoiding the maceration that has been occurring on the left. The left calcaneal wound is narrower and has a healthy and viable-appearing surface. He is here for his 6 application of Vendaje. 01/27/2022: The right calcaneal wound is down [name] just a pinhole. There is some periwound slough and callus. On the left, the wound is narrower and shorter by about a centimeter. The surface is robust and viable-appearing. Unfortunately, the rep for the trial skin substitute product did not provide any for [place] [name] use today. 02/04/2022: The right calcaneal wound remains unchanged. There is more accumulated callus. On the left, although the intake nurse measured it a little bit longer, it looks about the same [name] me. The surface has a layer of slough, but underneath this, there is good granulation tissue. 02/10/2022: The right calcaneus wound is nearly closed. There is still some callus  that builds up around the site. The left [name] looks about the same in terms of dimensions, but the surface is more robust and vital-appearing. 02/16/2022: The area of the right calcaneus that was nearly closed last week has closed, but there is a small opening at the mid foot where it looks like some moisture got retained and caused some reopening. The left foot wound is narrower and shallower. Both sites have a fair  amount of periwound callus and eschar. 02/24/2022: The small midfoot opening on the right calcaneus is a little bit smaller today. The left foot wound is narrower and shallower. He continues [name] accumulate periwound callus. No concern for infection. 03/01/2022: The patient came [name] clinic early because he showered and got his cast wet. Fortunately, there is no significant maceration [name] his foot but the callus softened and it looks like the wound on his left calcaneus may be a little bit wider. The wound on his right calcaneus is just a narrow slit. Continued accumulation of periwound callus bilaterally. 03/08/2022: The wound on his right calcaneus is very nearly closed, just a small pinpoint opening under a bit of eschar; the left wound has come in quite a bit, as well. It is narrower and shorter than at our last visit. Still with accumulated callus and eschar bilaterally. 03/17/2022: The right calcaneal wound is healed. The left wound is smaller and the surface itself is very clean, but there is some blue-green staining on the periwound callus, concerning for Pseudomonas aeruginosa. 03/23/2022: The right calcaneal wound remains closed. The left wound continues [name] contract. No further blue-green staining. Small amount of callus and slough accumulation. 03/28/2022: He came in early today because he had gotten his cast wet. On inspection, the wound itself did not get wet or macerated, just a little bit of the forefoot. The wound itself is basically unchanged. 04/07/2022: The right foot wound remains  closed. The left wound is the smallest that I have seen it [name] date. It is narrower and shorter. It still continues [name] accumulate slough on the surface. 04/15/2022: There is a band of epithelium now dividing the small left plantar foot wound in 2. There is still some slough on the surface. 04/21/2022: The wound continues [name] narrow. Just a little bit of slough on the surface. He seems [name] be responding well [name] endoform. KINGSLEE, ZALES ([id number]) [id number] Page 12 of 17 04/28/2022: Continued slow contraction of the wound. There is a little slough on the surface and some periwound callus. We have been using endoform and total contact cast. 05/05/2022: The wound appears [name] have stalled. There is slough and some periwound eschar/callus. No concern for infection, however. 05/12/2022: Unfortunately, his right foot has reopened. It is located at the most posterior aspect of his surgical incision. The area was noted [name] have drainage coming from it when his padding was removed today. Underneath some callus and senescent skin, there is an opening. No purulent drainage or malodor. On the left foot, the wound is again unchanged. There is some light blue staining on the callus, but no malodor or purulent drainage. 10/13; right and left heel remanence of extensive plantar foot wounds. These are better than I remember by quite a big margin however he is still left with wounds on the left plantar heel and the right plantar heel. Been using endoform bilaterally. A culture was done that showed apparently Pseudomonas but we are still waiting for the [facility] antibiotic [name] use gentamicin today. He is still very active by description I am not sure about the offloading of his noncasted right foot 10/20; both wounds right and left heel debrided not much change from last week. [name] Echevaria has arrived which is linezolid, gentamicin and ciprofloxacin we will use this with endoform. T contact cast on the  left otal 06/02/2022: Both wounds are smaller today. There is still a fair amount of callus buildup around the right foot ulcer. The left is more  superficial and nearly flush with the surrounding tissues. Also with slough and eschar buildup. 06/10/2022: The right sided wound appears [name] be nearly closed, if not completely so, although it is somewhat difficult [name] tell given the abnormal tissue and scarring in his foot. There is a fair amount of callus and crust accumulation. On the left, the wound looks about the same, again with callus and slough. He has an appointment next Thursday with Dr. Annamary Rummage in podiatry; I am hopeful that there may be some reconstructive options available for Mr. Pacek. 06/16/2022: Both wounds have some eschar and callus accumulation. The right sided wound is extremely narrow and barely open; the left is narrower than last week. There is a little bit of slough. He has his appointment in podiatry later today. 06/23/2022: The patient met with Dr. Annamary Rummage last week and unfortunately, there are no reconstructive options that he believes would be helpful. He did order an MRI [name] evaluate for osteomyelitis and fortunately, none was seen. The left sided wound is a little bit shorter and narrower today. The right sided wound is about the same. There is callus and eschar accumulation bilaterally. 06/29/2022: Both feet have improved from last week. There is epithelium making a valiant effort [name] creep across the surface on the left. The right [name] looks like it got a little dry and the deep crevasse in his midfoot has cracked. Both have eschar and there is some slough on the left. 07/07/2022: Both feet have improved. There is epithelium completely covering the calcaneus on the right with just a small opening in the crevasse in his midfoot. On the left, the open area of tissue is smaller but he continues [name] build up callus/eschar and slough. 07/15/2022: The opening in the midfoot on  the right is about the same size, covered with eschar and a little bit of slough. The open portion of the left wound is narrower and shorter with a bit of slough buildup. He admits [name] being on his feet more than recommended. 12/14; as far as I can tell everything on the right foot is closed. There is some eschar I removed some of this I cannot identify any open wound here. As usual this will be a very vulnerable area going forward. On the left this looks really quite healthy. I was pleasantly surprised [name] see how good this looked. Wound is certainly smaller and there appears [name] be healthy epithelialization. He has been using Promogran on the right and endoform on the left. He has been offloading the right foot with a heel offloading boot and he has a running shoe on the right foot 08/04/2022: The right foot remains closed. He has a thick cushioned insole in his sneaker. The left sided wound is smaller with just some slough and eschar accumulation. He is wearing the heel off loader on this foot. 08/15/2022: The right foot remains closed. The left wound has narrowed further. There is some slough and eschar accumulation. 08/25/2022: We put him in a peg assist shoe insert and as a result, he has more epithelialization of the ulcer with minimal slough and eschar accumulation. 09/01/2022: The wound is smaller by about half this week. Still with some slough on the surface. The peg assist shoe seems [name] be doing a remarkable job of adequately offloading the site. 09/08/2022: There is a little bit more epithelium coming in. There is some slough and callus buildup. 09/16/2022: The wound measures about the same size, but the epithelium that has grown in  looks more robust and stronger. There is some slough and callus buildup. 09/23/2022: The wound remains about the same size. The skin edges are looking rather senescent. 09/29/2022: The aggressive debridement I performed last week seems [name] have been effective. The wound is  smaller and has significantly less slough accumulation. 10/06/2022: There has been quite a bit of epithelialization since last week. There are still some open areas with slough accumulation. There is some callus buildup around the perimeter. 10/13/2022: Continued improvement. Minimal callus accumulation. 3/14; patient presents for follow-up. He has been using endoform [name] the wound bed without issues. He is using a surgical shoe with peg assist for offloading. 10/27/2022: The wound dimensions are stable. There is some senescent skin accumulation around the perimeter. 11/04/2022: Last week I performed a very aggressive debridement in an effort [name] stimulate the healing cascade. As has been the case when I have done this before, the wound has responded well. There has been epithelialization and contraction of the wound. There are just a couple of small open areas. 11/10/2022: Unfortunately, his foot got wet secondary [name] sweating and there has been some breakdown of the tissue, particularly the skin just adjacent [name] the main ulcer. 11/18/2022: We continue [name] struggle with moisture-related tissue breakdown around the perimeter of his wound. This has caused the thin epithelium that had formed on the surface [name] disintegrate. The underlying surface of the wound has good granulation tissue. 11/25/2022: The edges of the wound are much less macerated, but the surface is actually looking a little bit dry. There is slough accumulation. No obvious signs of infection. 12/01/2022: The wound edges are more macerated and broken down, making the wound larger. The surface has some slough on it. 12/08/2020: The wound looks better this week. There is significantly less maceration and moisture-related tissue breakdown. There is some slough on the wound surface. [name], OSANTOWSKI ([id number]) [id number] Page 13 of 17 12/15/2022: The wound continues [name] improve. There is almost no moisture-related tissue  breakdown. There is epithelium beginning [name] fill in again from the edges. Light slough on the wound surface. 12/22/2022: More epithelium has filled him from around the edges. There is some slough on the surface. No evidence of moisture-related tissue breakdown. 01/05/2023: There has been more epithelialization, particularly at the posterior calcaneal aspect of the wound. There has been a little bit of moisture accumulation with minimal maceration. 01/12/2023: More epithelium has filled in. There is very minimal accumulation of slough. 01/20/2023: The wound is measuring a little bit smaller today. It did measure deeper, but this appears [name] be secondary [name] some heaped up senescent skin around the edges. The epithelium that has been present at the posterior aspect of the wound seems [name] be a bit more robust with greater integrity. 01/26/2023: He reports intense itching [name] the skin around the wound and there has been some breakdown here. It looks fungal in nature. The wound itself looks pretty good with improved epithelialization with just some slough buildup. He has a little callus along the wound edges. 02/02/2023: He has been treating the periwound skin with an over-the-counter athlete's foot medication and notes significant improvement in his pruritus. The skin looks better here, as well. The wound continues [name] epithelialize. There is light slough accumulation. 02/10/2023: The periwound skin still looks fairly angry. He reports multiple small blisters that have opened up. He is not having the intense itching that he had prior, however. There has been more epithelialization at the calcaneus and there is minimal slough  accumulation. 02/16/2023: The periwound skin is improving. An online review of dermatology rash images suggest that this may be hand-foot-and-mouth disease, isolated [name] the feet; the patient says he actually had this as a child. The calcaneus continues [name] epithelialize and the tissue was quite  robust here. Very minimal slough accumulation on the open portion of the wound with a little bit of periwound callus buildup. 03/03/2023: The wound measurements are about the same, but there is more epithelium making its way [name] the surface. There is a little bit of crust and eschar around the edges and minimal slough on the surface. 03/09/2023: His dressing was applied rather haphazardly and the drawtex was not applied. The Optifoam was also reversed such that the orange [name] was in contact with his bare skin. He has some maceration around the wound edges, but this did not appear [name] result in any tissue breakdown. There is a little bit of slough on the surface and minimal eschar around the edges. 03/15/2023: The wound measured narrower today. There is some slough accumulation on the surface and some eschar around the edges. 03/23/2023: The length of the wound remains stable, but it continues [name] narrow. The skin that has covered the posterior aspect of the calcaneus is more robust and has better tensile integrity. Minimal slough and eschar accumulation. 03/30/2023: The wound length has decreased. This seems [name] be primarily due [name] more epithelium covering the posterior aspect of the site. Minimal slough and eschar buildup. 04/07/2023: The wound measured a little bit smaller again today. There is minimal slough accumulation with just a little bit of eschar and callus around the edges. Patient History Information obtained from Patient. Family History Cancer - Maternal Grandparents, Diabetes - Father,Paternal Grandparents, Heart Disease - Maternal Grandparents, Hypertension - Father,Paternal Grandparents, Lung Disease - Siblings, No family history of Hereditary Spherocytosis, Kidney Disease, Seizures, Stroke, Thyroid Problems, Tuberculosis. Social History Never smoker, Marital Status - Married, Alcohol Use - Never, Drug Use - No History, Caffeine Use - Daily - tea, soda. Medical History Eyes Denies history  of Cataracts, Glaucoma, Optic Neuritis Ear/Nose/Mouth/Throat Denies history of Chronic sinus problems/congestion, Middle ear problems Hematologic/Lymphatic Denies history of Anemia, Hemophilia, Human Immunodeficiency Virus, Lymphedema, Sickle Cell Disease Respiratory Patient has history of Asthma Denies history of Aspiration, Chronic Obstructive Pulmonary Disease (COPD), Pneumothorax, Sleep Apnea, Tuberculosis Cardiovascular Patient has history of Angina - with COVID, Hypertension Denies history of Arrhythmia, Congestive Heart Failure, Coronary Artery Disease, Deep Vein Thrombosis, Hypotension, Myocardial Infarction, Peripheral Arterial Disease, Peripheral Venous Disease, Phlebitis, Vasculitis Gastrointestinal Denies history of Cirrhosis , Colitis, Crohns, Hepatitis A, Hepatitis B, Hepatitis C Endocrine Denies history of Type I Diabetes, Type II Diabetes Genitourinary Denies history of End Stage Renal Disease Immunological Denies history of Lupus Erythematosus, Raynauds, Scleroderma Integumentary (Skin) Denies history of History of Burn Musculoskeletal Denies history of Gout, Rheumatoid Arthritis, Osteoarthritis, Osteomyelitis Neurologic Denies history of Dementia, Neuropathy, Quadriplegia, Paraplegia, Seizure Disorder Oncologic Denies history of Received Chemotherapy, Received Radiation Psychiatric Denies history of Anorexia/bulimia, Confinement Anxiety Hospitalization/Surgery History - COVID PNA 07/22/2019- 11/14/2019. - 03/27/2020 wound debridement/ skin graft. Medical A Surgical History Notes nd Constitutional Symptoms [facility]) HERU, GUNZENHAUSER ([id number]) [id number] Page 14 of 17 COVID PNA 07/22/2019-11/14/2019 VENT ECMO, foot drop left foot , Genitourinary kidney stone Psychiatric anxiety Objective Constitutional no acute distress. Vitals Time Taken: 10:16 AM, Height: 69 in, Weight: 280 lbs, BMI: 41.3, Temperature: 99.1 F, Pulse: 91  bpm, Respiratory Rate: 18 breaths/min, Blood Pressure: 137/81 mmHg. Respiratory Normal work of breathing  on room air. General Notes: 04/07/2023: The wound measured a little bit smaller again today. There is minimal slough accumulation with just a little bit of eschar and callus around the edges. Integumentary (Hair, Skin) Wound #2 status is Open. Original cause of wound was Pressure Injury. The date acquired was: 10/07/2019. The wound has been in treatment 142 weeks. The wound is located on the Left Calcaneus. The wound measures 3.3cm length x 1.3cm width x 0.2cm depth; 3.369cm^2 area and 0.674cm^3 volume. There is Fat Layer (Subcutaneous Tissue) exposed. There is no tunneling or undermining noted. There is a medium amount of serosanguineous drainage noted. The wound margin is distinct with the outline attached [name] the wound base. There is large (67-100%) pink granulation within the wound bed. There is a small (1-33%) amount of necrotic tissue within the wound bed including Adherent Slough. The periwound skin appearance had no abnormalities noted for moisture. The periwound skin appearance exhibited: Scarring. The periwound skin appearance did not exhibit: Callus, Crepitus, Excoriation, Induration, Rash, Atrophie Blanche, Cyanosis, Ecchymosis, Hemosiderin Staining, Mottled, Pallor, Rubor, Erythema. Periwound temperature was noted as No Abnormality. The periwound has tenderness on palpation. Assessment Active Problems ICD-10 Non-pressure chronic ulcer of other part of left foot with fat layer exposed Procedures Wound #2 Pre-procedure diagnosis of Wound #2 is a Pressure Ulcer located on the Left Calcaneus . There was a Selective/Open Wound Non-Viable Tissue Debridement with a total area of 3.37 sq cm performed by [name], MD. With the following instrument(s): Curette [name] remove Non-Viable tissue/material. Material removed includes Callus and Slough and after achieving pain control using  Lidocaine 4% T opical Solution. No specimens were taken. A time out was conducted at 10:34, prior [name] the start of the procedure. A Minimum amount of bleeding was controlled with Pressure. The procedure was tolerated well. Post Debridement Measurements: 3.3cm length x 1.3cm width x 0.2cm depth; 0.674cm^3 volume. Post debridement Stage noted as Category/Stage III. Character of Wound/Ulcer Post Debridement is improved. Post procedure Diagnosis Wound #2: Same as Pre-Procedure General Notes: Scribed for Dr Lady Gary by Brenton Grills RN. Plan Follow-up Appointments: Return Appointment in 1 week. - Dr. Lady Gary Room 3 Anesthetic: Wound #2 Left Calcaneus: (In clinic) Topical Lidocaine 4% applied [name] wound bed - In clinic Bathing/ Shower/ Hygiene: May shower and wash wound with soap and water. Edema Control - Lymphedema / SCD / Other: Avoid standing for long periods of time. Moisturize legs daily. - as needed Off-Loading: Other: - keep pressure off of the bottom of your feet. Elevate legs throughout the day. Use the Shoe with the PegAssist off-loading insole DAMANY, KRIKORIAN ([id number]) [id number] Page 15 of 17 Additional Orders / Instructions: Follow Nutritious Diet - Try [name] get 70-100 grams of Protein a day+ WOUND #2: - Calcaneus Wound Laterality: Left Cleanser: Normal Saline (Generic) 1 x Per Day/30 Days Discharge Instructions: Cleanse the wound with Normal Saline prior [name] applying a clean dressing using gauze sponges, not tissue or cotton balls. Cleanser: Wound Cleanser (Generic) 1 x Per Day/30 Days Discharge Instructions: Cleanse the wound with wound cleanser prior [name] applying a clean dressing using gauze sponges, not tissue or cotton balls. Peri-Wound Care: Ketoconazole Cream 2% 1 x Per Day/30 Days Discharge Instructions: Apply Ketoconazole mixed with zinc [name] the periwound Peri-Wound Care: Zinc Oxide Ointment 30g tube 1 x Per Day/30 Days Discharge Instructions: Apply  Zinc Oxide [name] periwound with each dressing change Topical: Gentamicin (Generic) 1 x Per Day/30 Days Discharge Instructions: apply [name] the wound bed Prim  Dressing: Maxorb Extra Ag+ Alginate Dressing, 4x4.75 (in/in) (Generic) 1 x Per Day/30 Days ary Discharge Instructions: Apply [name] wound bed as instructed Secondary Dressing: Drawtex 4x4 in (Generic) 1 x Per Day/30 Days Discharge Instructions: Apply over primary dressing as directed. Secondary Dressing: Optifoam Non-Adhesive Dressing, 4x4 in (Generic) 1 x Per Day/30 Days Discharge Instructions: Apply over primary dressing as directed. Secondary Dressing: Woven Gauze Sponge, Non-Sterile 4x4 in (Generic) 1 x Per Day/30 Days Discharge Instructions: Apply over primary dressing as directed. Secured With: 71M Medipore H Soft Cloth Surgical T ape, 4 x 10 (in/yd) (Generic) 1 x Per Day/30 Days Discharge Instructions: Secure with tape as directed. Com pression Wrap: Kerlix Roll 4.5x3.1 (in/yd) (Generic) 1 x Per Day/30 Days Discharge Instructions: Apply Kerlix and Coban compression as directed. Add-Ons: [employer] Offloading Boot, Size Regular 1 x Per Day/30 Days 04/07/2023: The wound measured a little bit smaller again today. There is minimal slough accumulation with just a little bit of eschar and callus around the edges. I used a curette [name] debride callus and slough from his wound. We will continue topical gentamicin, silver alginate, and drawtex with a peg assist offloading insert. Follow-up in 1 week. Electronic Signature(s) Signed: 04/07/2023 11:20:10 AM By: [name] MD FACS Entered By: [name] on 04/07/2023 08:20:10 -------------------------------------------------------------------------------- HxROS Details Patient Name: Date of Service: [name], [name] [name] E. 04/07/2023 10:00 A M Medical Record Number: [id number] Patient Account Number: [id number] Date of Birth/Sex: Treating RN: 05-23-1976 (47 y.o. M) Primary Care Provider: Dorinda Hill Other Clinician: Referring Provider: Treating Provider/Extender: Jana Hakim in Treatment: 142 Information Obtained From Patient Constitutional Symptoms (General Health) Medical History: Past Medical History Notes: COVID PNA 07/22/2019-11/14/2019 VENT ECMO, foot drop left foot , Eyes Medical History: Negative for: Cataracts; Glaucoma; Optic Neuritis Ear/Nose/Mouth/Throat Medical History: Negative for: Chronic sinus problems/congestion; Middle ear problems Hematologic/Lymphatic Medical History: Negative for: Anemia; Hemophilia; Human Immunodeficiency Virus; Lymphedema; Sickle Cell Disease JERIMEY, WEIBLEY ([id number]) [id number] Page 16 of 17 Respiratory Medical History: Positive for: Asthma Negative for: Aspiration; Chronic Obstructive Pulmonary Disease (COPD); Pneumothorax; Sleep Apnea; Tuberculosis Cardiovascular Medical History: Positive for: Angina - with COVID; Hypertension Negative for: Arrhythmia; Congestive Heart Failure; Coronary Artery Disease; Deep Vein Thrombosis; Hypotension; Myocardial Infarction; Peripheral Arterial Disease; Peripheral Venous Disease; Phlebitis; Vasculitis Gastrointestinal Medical History: Negative for: Cirrhosis ; Colitis; Crohns; Hepatitis A; Hepatitis B; Hepatitis C Endocrine Medical History: Negative for: Type I Diabetes; Type II Diabetes Genitourinary Medical History: Negative for: End Stage Renal Disease Past Medical History Notes: kidney stone Immunological Medical History: Negative for: Lupus Erythematosus; Raynauds; Scleroderma Integumentary (Skin) Medical History: Negative for: History of Burn Musculoskeletal Medical History: Negative for: Gout; Rheumatoid Arthritis; Osteoarthritis; Osteomyelitis Neurologic Medical History: Negative for: Dementia; Neuropathy; Quadriplegia; Paraplegia; Seizure Disorder Oncologic Medical History: Negative for: Received Chemotherapy;  Received Radiation Psychiatric Medical History: Negative for: Anorexia/bulimia; Confinement Anxiety Past Medical History Notes: anxiety Immunizations Pneumococcal Vaccine: Received Pneumococcal Vaccination: No Implantable Devices None Hospitalization / Surgery History Type of Hospitalization/Surgery COVID PNA 07/22/2019- 11/14/2019 03/27/2020 wound debridement/ skin graft Family and Social History Cancer: Yes - Maternal Grandparents; Diabetes: Yes - Father,Paternal Grandparents; Heart Disease: Yes - Maternal Grandparents; Hereditary SpherocytosisABE, REINE ([id number]) [id number] Page 17 of 17 No; Hypertension: Yes - Father,Paternal Grandparents; Kidney Disease: No; Lung Disease: Yes - Siblings; Seizures: No; Stroke: No; Thyroid Problems: No; Tuberculosis: No; Never smoker; Marital Status - Married; Alcohol Use: Never; Drug Use: No History; Caffeine Use: Daily - tea, soda; Financial Concerns: No; Food,  Clothing or Shelter Needs: No; Support System Lacking: No; Transportation Concerns: No Electronic Signature(s) Signed: 04/07/2023 11:20:44 AM By: [name] MD FACS Entered By: [name] on 04/07/2023 08:18:50 -------------------------------------------------------------------------------- SuperBill Details Patient Name: Date of Service: [name], [name] [name] 04/07/2023 Medical Record Number: [id number] Patient Account Number: [id number] Date of Birth/Sex: Treating RN: 24-Jan-1976 (47 y.o. [name] Primary Care Provider: Dorinda Hill Other Clinician: Referring Provider: Treating Provider/Extender: Jana Hakim in Treatment: 142 Diagnosis Coding ICD-10 Codes Code Description [id number] Non-pressure chronic ulcer of other part of left foot with fat layer exposed Facility Procedures : CPT4 Code: 24401027 Description: [phone] - DEBRIDE WOUND 1ST 20 SQ CM OR < ICD-10 Diagnosis Description L97.522 Non-pressure chronic  ulcer of other part of left foot with fat layer exposed Modifier: Quantity: 1 Physician Procedures : CPT4 Code Description Modifier [id number] - WC PHYS LEVEL 4 - EST PT 25 ICD-10 Diagnosis Description L97.522 Non-pressure chronic ulcer of other part of left foot with fat layer exposed Quantity: 1 : [id number] - WC PHYS DEBR WO ANESTH 20 SQ CM ICD-10 Diagnosis Description L97.522 Non-pressure chronic ulcer of other part of left foot with fat layer exposed Quantity: 1 Electronic Signature(s) Signed: 04/07/2023 11:20:27 AM By: [name] MD FACS Entered By: [name] on 04/07/2023 08:20:27

## 2023-04-11 ENCOUNTER — Encounter: Payer: Self-pay | Admitting: *Deleted

## 2023-04-13 ENCOUNTER — Encounter (HOSPITAL_BASED_OUTPATIENT_CLINIC_OR_DEPARTMENT_OTHER): Payer: PPO | Attending: General Surgery | Admitting: General Surgery

## 2023-04-13 DIAGNOSIS — L97512 Non-pressure chronic ulcer of other part of right foot with fat layer exposed: Secondary | ICD-10-CM | POA: Insufficient documentation

## 2023-04-13 DIAGNOSIS — Z8616 Personal history of COVID-19: Secondary | ICD-10-CM | POA: Diagnosis not present

## 2023-04-13 DIAGNOSIS — L89623 Pressure ulcer of left heel, stage 3: Secondary | ICD-10-CM | POA: Diagnosis not present

## 2023-04-13 DIAGNOSIS — I10 Essential (primary) hypertension: Secondary | ICD-10-CM | POA: Diagnosis not present

## 2023-04-13 DIAGNOSIS — L97522 Non-pressure chronic ulcer of other part of left foot with fat layer exposed: Secondary | ICD-10-CM | POA: Diagnosis not present

## 2023-04-13 DIAGNOSIS — L84 Corns and callosities: Secondary | ICD-10-CM | POA: Diagnosis not present

## 2023-04-14 ENCOUNTER — Encounter: Payer: PPO | Attending: Surgery | Admitting: Dietician

## 2023-04-14 ENCOUNTER — Encounter: Payer: Self-pay | Admitting: Dietician

## 2023-04-14 VITALS — Ht 68.5 in | Wt 303.9 lb

## 2023-04-14 DIAGNOSIS — Z6841 Body Mass Index (BMI) 40.0 and over, adult: Secondary | ICD-10-CM | POA: Insufficient documentation

## 2023-04-14 DIAGNOSIS — E119 Type 2 diabetes mellitus without complications: Secondary | ICD-10-CM | POA: Insufficient documentation

## 2023-04-14 DIAGNOSIS — E669 Obesity, unspecified: Secondary | ICD-10-CM

## 2023-04-14 DIAGNOSIS — Z713 Dietary counseling and surveillance: Secondary | ICD-10-CM | POA: Insufficient documentation

## 2023-04-14 NOTE — Progress Notes (Signed)
Nutrition Assessment for Bariatric Surgery: Pre-Surgery Behavioral and Nutrition Intervention Program   Medical Nutrition Therapy  Appt Start Time: 10:45    End Time: 12:03  Patient was seen on 04/14/2023 for Pre-Operative Nutrition Assessment. Purpose of todays visit  enhance perioperative outcomes along with a healthy weight maintenance   Referral stated Supervised Weight Loss (SWL) visits needed: 5  Planned surgery: undetermined (pt states the surgeon is suggesting the sleeve, he states he is leaning toward the RYGB)  Pt expectation of surgery: more confidence and being healthier; do more with family   NUTRITION ASSESSMENT   Anthropometrics  Start weight at [facility]: 303.9 lbs (date: 04/14/2023) Height: 68.5 in BMI: 45.54 kg/m2     Clinical   Pharmacotherapy: History of weight loss medication used: Ozempic and Mounjaro, stating insurance started not covering.  Medical hx: asthma, obesity, sleep apnea, HTN Medications: vit D, nasacort, desyrel, miagra, protonix, singulair,  lamictal, atrovent, norco, synalar, cymbalta, symbicort, norvasc, aluterol,  Labs: vit D 21.1; HDL 33; A1c 6.1; sodium 145; albumin 4.0; RBC 6.06; platelets 132 Notable signs/symptoms: wounds from pressure area in the hospital years ago; arrived in wheel chair Any previous deficiencies? No  Evaluation of Nutritional Deficiencies: Micronutrient Nutrition Focused Physical Exam: Hair: No issues observed Eyes: No issues observed Mouth: No issues observed Neck: No issues observed Nails: No issues observed Skin: No issues observed  Lifestyle & Dietary Hx  Pt arrived in wheel chair. Pt states he suffers from depression.  Current Physical Activity Recommendations state 150 minutes per week of moderate to vigorous movement including Cardio and 1-2 days of resistance activities as well as flexibility/balance activities:  Pts current physical activity: light weight every other day; 2-3 days for 30-45 minutes (legs  and arm), with 50-75% recommendation reached   Sleep Hygiene: duration and quality: on oxygen at night, aches and pain; not great sleep  Current Patient Perceived Stress Level as stated by pt on a scale of 1-10:  7       Stress Management Techniques: stress release activity; try to stay calm without using the activity.  According to the Dietary Guidelines for Americans Recommendation: equivalent 1.5-2 cups fruits per day, equivalent 2-3 cups vegetables per day and at least half all grains whole  Fruit servings per day (on average): 1-2, meeting 66-100% recommendation  Non-starchy vegetable servings per day (on average): 0-2, meeting 0-100% recommendation  Whole Grains per day (on average): 0-1  Number of meals missed/skipped per week out of 21: 1-2  24-Hr Dietary Recall First Meal: skip or 4 egg whites, 1-2 slices of sara lee delights bread, sf jam, [place] sausage, Fairlife fat free milk Snack: fruit or protein shake or triscuit or wheat thin or cheese its  Second Meal: salad with lean meat (deli), lean cuisine steamer bowl Snack: fruit or protein shake or triscuit or wheat thin or cheese its Third Meal: varies, lean meat, vegetable, rice Snack: sometimes baked cheese its Beverages: water, one regular soda a day or every other day,  Alcoholic beverages per week: 0-1 (once a month might have a few)   Estimated Energy Needs Calories: 1600  NUTRITION DIAGNOSIS  Overweight/obesity ([place]-3.3) related to past poor dietary habits and physical inactivity as evidenced by patient w/ planned sleeve or RYGB surgery following dietary guidelines for continued weight loss.    NUTRITION INTERVENTION  Nutrition counseling (C-1) and education (E-2) to facilitate bariatric surgery goals.  Educated pt on micronutrient deficiencies post-surgery and behavioral/dietary strategies to start in order to mitigate that  risk   Behavioral and Dietary Interventions Pre-Op Goals Reviewed with the  Patient Nutrition: Healthy Eating Behaviors Switch to non-caloric, non-carbonated and non-caffeinated beverages such as  water, unsweetened tea, Crystal Light and zero calorie beverages (aim for 64 oz. per day) Cut out grazing between meals or at night  Find a protein shake you like Eat every 3-5 hours; keep daily to sleep routine and scheduled meals        Eliminate distractions while eating (TV, computer, reading, driving, texting) Take 82-95 minutes to eat a meal  Decrease high sugar foods/decrease high fat/fried foods Eliminate alcoholic beverages Increase protein intake (eggs, fish, chicken, yogurt) before surgery Eat non starchy vegetables 2 times a day 7 days a week Eat complex carbohydrates such as whole grains and fruits   Behavioral Modification: Physical Activity Increase my usual daily activity (use stairs, park farther, etc.) Engage in _______________________  activity  _______ minutes ______ times per week  Other:    _________________________________________________________________     Problem Solving I will think about my usual eating patterns and how to tweak them How can my friends and family support me Barriers to starting my changes Learn and understand appetite verses hunger   Healthy Coping Allow for ___________ activities per week to help me manage stress Reframe negative thoughts I will keep a picture of someone or something that is my inspiration & look at it daily   Monitoring  Weigh myself once a week  Measure my progress by monitoring how my clothes fit Keep a food record of what I eat and drink for the next ________ (time period) Take pictures of what I eat and drink for the next ________ (time period) Use an app to count steps/day for the next_______ (time period) Measure my progress such as increased energy and more restful sleep Monitor your acid reflux and bowel habits, are they getting better?   *Goals that are bolded indicate the pt would like to  start working towards these  Handouts Provided Include  Bariatric Surgery handouts (Nutrition Visits, Pre Surgery Behavioral Change Goals, Protein Shakes Brands to Choose From, Vitamins & Mineral Supplementation)  Learning Style & Readiness for Change Teaching method utilized: Visual, Auditory, and hands on  Demonstrated degree of understanding via: Teach Back  Readiness Level: contemplative Barriers to learning/adherence to lifestyle change: mobility, desire to meet some goals for success.  RD's Notes for Next Visit Patient progress toward chosen goals.    MONITORING & EVALUATION Dietary intake, weekly physical activity, body weight, and preoperative behavioral change goals   Next Steps  Patient is to follow up at [facility] in 2 weeks for first SWL visit.

## 2023-04-17 ENCOUNTER — Ambulatory Visit (HOSPITAL_BASED_OUTPATIENT_CLINIC_OR_DEPARTMENT_OTHER): Payer: PPO | Attending: Surgery | Admitting: Physical Therapy

## 2023-04-17 DIAGNOSIS — R262 Difficulty in walking, not elsewhere classified: Secondary | ICD-10-CM | POA: Diagnosis not present

## 2023-04-17 DIAGNOSIS — M25662 Stiffness of left knee, not elsewhere classified: Secondary | ICD-10-CM | POA: Insufficient documentation

## 2023-04-17 DIAGNOSIS — M25661 Stiffness of right knee, not elsewhere classified: Secondary | ICD-10-CM | POA: Diagnosis not present

## 2023-04-17 DIAGNOSIS — M6281 Muscle weakness (generalized): Secondary | ICD-10-CM | POA: Insufficient documentation

## 2023-04-17 NOTE — Therapy (Signed)
OUTPATIENT PHYSICAL THERAPY THORACOLUMBAR EVALUATION   Patient Name: [name] MRN: [id number] DOB:[birth year], 47 y.o., male Today's Date: 04/17/2023  END OF SESSION:    Past Medical History:  Diagnosis Date   Anginal pain (HCC)    with covid   Anxiety    Asthma    Depression    Dyspnea    GERD (gastroesophageal reflux disease)    Headache    History of kidney stones    LEFT URETERAL STONE   HTN (hypertension)    Pancreatitis 2018   GALLBALDDER SLUDGE CAUSED ISSUED RESOLVED   Pneumonia 07/2019   covid   Prediabetes    SOBOE (shortness of breath on exertion)    Past Surgical History:  Procedure Laterality Date   CANNULATION FOR ECMO (EXTRACORPOREAL MEMBRANE OXYGENATION) N/A 08/28/2019   Procedure: CANNULATION FOR VV ECMO (EXTRACORPOREAL MEMBRANE OXYGENATION);  Surgeon: Donata Clay, Theron Arista, MD;  Location: [facility] OR;  Service: Open Heart Surgery;  Laterality: N/A;  CRESCENT CANNULA   CANNULATION FOR ECMO (EXTRACORPOREAL MEMBRANE OXYGENATION) N/A 09/10/2019   Procedure: CANNULATION FOR ECMO (EXTRACORPOREAL MEMBRANE OXYGENATION) PUTTING IN CRESCENT 32FR CANNULA  AND REMOVING GROING CANNULATION;  Surgeon: Linden Dolin, MD;  Location: [facility] OR;  Service: Open Heart Surgery;  Laterality: N/A;  PUTTING IN CRESCENT/REMOVING GROIN CANNULATION   CYSTOSCOPY/URETEROSCOPY/HOLMIUM LASER/STENT PLACEMENT Left 04/18/2019   Procedure: LEFT URETEROSCOPY/HOLMIUM LASER/STENT PLACEMENT;  Surgeon: Crist Fat, MD;  Location: [facility];  Service: Urology;  Laterality: Left;   CYSTOSCOPY/URETEROSCOPY/HOLMIUM LASER/STENT PLACEMENT Left 05/02/2019   Procedure: CYSTOSCOPY/URETEROSCOPY/HOLMIUM LASER/STENT EXCHANGE;  Surgeon: Crist Fat, MD;  Location: [facility];  Service: Urology;  Laterality: Left;   ECMO CANNULATION N/A 08/03/2019   Procedure: ECMO CANNULATION;  Surgeon: Linden Dolin, MD;  Location: [facility];  Service: Cardiovascular;  Laterality: N/A;    ESOPHAGOGASTRODUODENOSCOPY N/A 09/11/2019   Procedure: ESOPHAGOGASTRODUODENOSCOPY (EGD);  Surgeon: Linden Dolin, MD;  Location[facility] OR;  Service: Thoracic;  Laterality: N/A;   GRAFT APPLICATION Bilateral 03/27/2020   Procedure: APPLICATION OF SKIN GRAFT BILATERAL FEET;  Surgeon: Felecia Shelling, DPM;  Location: [facility];  Service: Podiatry;  Laterality: Bilateral;   IR REPLC GASTRO/COLONIC TUBE PERCUT W/FLUORO  10/14/2019   IRRIGATION AND DEBRIDEMENT SHOULDER Right 09/29/2017   Procedure: IRRIGATION AND DEBRIDEMENT SHOULDER;  Surgeon: Tarry Kos, MD;  Location: [facility] OR;  Service: Orthopedics;  Laterality: Right;   LUMBAR DISC SURGERY  2002   NASAL ENDOSCOPY WITH EPISTAXIS CONTROL N/A 08/31/2019   Procedure: NASAL ENDOSCOPY WITH EPISTAXIS CONTROL WITH CAUTERIZATION;  Surgeon: Christia Reading, MD;  Location: [facility] OR;  Service: ENT;  Laterality: N/A;   PORTACATH PLACEMENT N/A 09/11/2019   Procedure: PEG TUBE INSERTION - BEDSIDE;  Surgeon: Linden Dolin, MD;  Location: [facility] OR;  Service: Thoracic;  Laterality: N/A;   TEE WITHOUT CARDIOVERSION N/A 08/28/2019   Procedure: TRANSESOPHAGEAL ECHOCARDIOGRAM (TEE);  Surgeon: Donata Clay, Theron Arista, MD;  Location: [facility] OR;  Service: Open Heart Surgery;  Laterality: N/A;   TEE WITHOUT CARDIOVERSION N/A 09/10/2019   Procedure: TRANSESOPHAGEAL ECHOCARDIOGRAM (TEE);  Surgeon: Linden Dolin, MD;  Location: [facility] OR;  Service: Open Heart Surgery;  Laterality: N/A;   WOUND DEBRIDEMENT Bilateral 03/27/2020   Procedure: EXCISIONAL DEBRIDEMENT OF ULCERS BILATERAL FEET;  Surgeon: Felecia Shelling, DPM;  Location: [facility];  Service: Podiatry;  Laterality: Bilateral;   Patient Active Problem List   Diagnosis Date Noted   Type 2 diabetes mellitus without complication, without long-term current use of insulin (  HCC) 10/18/2022   Vitamin D deficiency 10/18/2022   Other fatigue 09/07/2022   SOBOE (shortness of breath on exertion) 09/07/2022   Nocturnal hypoxemia 09/07/2022   Mood disorder  (HCC) 09/07/2022   Ulcer of left foot (HCC) 09/07/2022   Depression 08/23/2022   Prediabetes 08/23/2022   Obstructive sleep apnea 01/11/2022   Insomnia due to other mental disorder 12/06/2021   Left foot drop 09/06/2021   Wheelchair dependence 06/04/2021   Encounter for therapeutic drug monitoring 09/29/2020   COVID-19 long hauler manifesting chronic dyspnea 09/15/2020   Morbid obesity (HCC) with starting BMI 46 09/15/2020   Encounter for attention to tracheostomy (HCC) 09/15/2020   Critical illness polyneuropathy (HCC) 09/15/2020   Moderate protein-calorie malnutrition (HCC) 09/15/2020   Pyogenic inflammation of bone (HCC) 09/09/2020   Long COVID 09/09/2020   Acute osteomyelitis (HCC) 08/31/2020   MSSA (methicillin susceptible Staphylococcus aureus) infection 08/31/2020   Reactive depression 07/01/2020   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   Impaired sensation to light touch 04/15/2020   Diffuse alveolar damage (HCC) 03/09/2020   Eschar of foot    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   History of COVID-19    Pressure injury of skin 08/22/2019   Need for emotional support    Chest tube in place    Personal history of ECMO    Advanced care planning/counseling discussion    Palliative care by specialist    Goals of care, counseling/discussion    Advanced directives, counseling/discussion    Subcutaneous crepitus    Thrombocytopenia (HCC)    Leukopenia    Fever    Gram positive bacterial infection    Septic arthritis of right acromioclavicular joint (HCC) 09/29/2017   Chronic left-sided low back pain with left-sided sciatica 09/19/2017   Epigastric pain    Primary hypertension 04/17/2017   Moderate persistent asthma 07/21/2015   Allergic rhinoconjunctivitis 07/21/2015   GERD (gastroesophageal reflux disease) 07/21/2015   Abnormal gait 11/12/2009   TARSAL TUNNEL SYNDROME, LEFT 10/08/2009   PES PLANUS 10/08/2009     REFERRING  PROVIDER: Berna Bue, MD   REFERRING DIAG: E66.01 (ICD-10-CM) - Morbid obesity due to excess calories   Rationale for Evaluation and Treatment: Rehabilitation  THERAPY DIAG:  No diagnosis found.  ONSET DATE: PT order 02/03/2023  SUBJECTIVE:                                                                                                                                                                                           SUBJECTIVE STATEMENT: Pt is planning to have bariatric surgery.  Pt states he is doing all of the  steps to prepare for bariatric surgery.  MD note indicated that he didn't think that pt is ready for surgery yet.  MD referred pt to PT to work on some conditioning exercises.  PT order 02/03/2023.  Reason for referral:  deconditioning  Pt had severe Covid and pneumonia 4 years ago.  He was in the hospital for approx 4.5 months and was in an induced coma for approx 3 months.  Pt developed ulcers on bilat heels while in hospital.  Pt transitioned to a SNF.  Pt received PT in the SNF though was limited due to ulcers in feet.  Pt states his R foot ulcer has pretty much healed though is L has not.  Pt goes to wound care weekly.  He states he's not supposed to walk a lot on his foot and was told by the wound care MD to stay off his feet as much as possible.   He states he walks to the bathroom in his home occasionally, but uses his W/C primarily in home.  He states he rarely walks to his truck.  He is primarily in W/C.  When he does walk, he doesn't use the walker much.  Pt has decreased stamina and gets SOB.  Pt reports deficits in R grip strength.  Pt is very limited with standing activities due to L foot ulcer.  He is very limited in household chores.  Pt is able to cook and fold laundry in the W/C.   Pt states he has numbness in bilat distal LE's and feet and distal UE's which began after having covid.  Pt states he has some dropfoot on L.    Pt denies any adverse effects  after prior Rx.  Pt reports no changes since last visit.  Pt states he has not moved around a lot today.  Pt sees wound care MD once per week and has his L foot wound debrided.  She didn't have to do as much debridement last week due to the healing.     PERTINENT HISTORY:  L foot ulcer--Pt primarily in W/C and not putting much weight through L LE.  Pt was instructed to stay off his foot as much as possible.  He is in a offloader shoe. Debridement of ulcers in bilat feet and skin graft in 2021.  R foot ulcer with good healing.  Pt receives wound care for L foot ulcer.  Pt states he has some dropfoot on L.   Long covid, chronic back pain, arthritis, depression, HTN, Dyspnea,  Lumbar surgery for bulging disc in 2002 R shoulder arthrotomy of AC jt, irrigation and debridement of distal clavicle, and partial excision of distal clavicle on 2019   PAIN:  Location:  back, feet, and bilat feet NPRS:  2/10 current, 8/10 worst, 2/10 best  PRECAUTIONS: L foot ulcer--avoid WB'ing of L LE, long covid, lumbar surgery in 2002    WEIGHT BEARING RESTRICTIONS: Pt states he doesn't have specific WB'ing restrictions though was instructed to stay off his foot as much as possible due to L foot ulcer.    FALLS:  Has patient fallen in last 6 months? Yes. Number of falls 2  LIVING ENVIRONMENT: Lives with: lives with their family Lives in: 1 story home Stairs: 5-6 steps with rails; has a ramp Has following equipment at home: W/C, FWW  OCCUPATION: Pt is on disability.  PLOF: Independent  PATIENT GOALS: improve muscle strength and stamina, to be able to walk again.    OBJECTIVE:  DIAGNOSTIC FINDINGS:  Chest x ray on 03/02/2023: FINDINGS: Stable cardiac and mediastinal contours. Similar scarring within the left mid and lower lung and right upper lung. No pleural effusion or pneumothorax. Thoracic spine degenerative changes.   IMPRESSION: No acute cardiopulmonary process. Similar scarring.  PATIENT  SURVEYS:  LEFS 8/80   COGNITION: Overall cognitive status: Within functional limits for tasks assessed     OBSERVATION: Pt in W/C.  Pt wearing an offloader shoe on L with a gauze wrap/dressing on heel.  Pt has a wound on plantar surface of R foot that has mostly healed.  He does have some scabbing.  He states he was informed it is going to fill in with callus and skin.  POSTURE: In sitting:  bilat foot ER, R > L    LOWER EXTREMITY ROM:     AROM Right eval Left eval  Hip flexion    Hip extension    Hip abduction    Hip adduction    Hip internal rotation    Hip external rotation    Knee flexion 111 108  Knee extension 3/0 AROM/PROM 0  Ankle dorsiflexion 9 limited  Ankle plantarflexion 46   Ankle inversion    Ankle eversion     (Blank rows = not tested)  LOWER EXTREMITY MMT:    MMT Right eval Left eval  Hip flexion 5/5 4+/5  Hip extension    Hip abduction 5/5 5/5  Hip adduction    Hip internal rotation    Hip external rotation    Knee flexion    Knee extension 5/5 5/5  Ankle dorsiflexion 5/5   Ankle plantarflexion    Ankle inversion    Ankle eversion     (Blank rows = not tested)    GAIT: Not tested due to Pt's L foot ulcer.  TODAY'S TREATMENT:                                                                                                                                See below for pt education  HEP---- POSS 11:06 end  PATIENT EDUCATION:  Education details: objective findings, POC, relevant anatomy, rationale of exercises Person educated: Patient Education method: Explanation Education comprehension: verbalized understanding  HOME EXERCISE PROGRAM: Access Code: [email or web address] URL: [email or web address] Date: 04/17/2023 Prepared by: Aaron Edelman  Exercises - Seated March  - 1-2 x daily - 7 x weekly - 1-2 sets - 10 reps - Seated Long Arc Quad  - 1 x daily - 7 x weekly - 2 sets - 10 reps  ASSESSMENT:  CLINICAL IMPRESSION: Patient  is a 47 y.o. male with a dx of morbid obesity who is planning on having bariatric surgery.  MD ordered PT due to deconditioning.  Pt is very limited with activity currently primarily due to his L foot ulcer.  He is primarily in a W/C and is not putting much weight through L LE.  He occasionally takes a few  steps in his home and rarely takes steps to his truck.  He receives wound care for L foot ulcer and is primarily in W/C to allow L foot to heal.  Pt had severe Covid 4 years ago and developed bilat feet ulcers while in the hospital.  He is very limited in functional mobility and performance of household chores.  He performs the household chores that he is able to while sitting in W/C.  Pt reports having decreased stamina.  Pt has limited knee ROM bilat.  He has minimal weakness in L hip flexion and is limited with actively performing DF.  Pt states he has dropfoot on L.  Pt may benefit from skilled PT services to address impairments and improve overall function.    PT brought pt back to treatment late due to pt using the restroom   OBJECTIVE IMPAIRMENTS: decreased activity tolerance, decreased endurance, decreased mobility, difficulty walking, decreased ROM, decreased strength, hypomobility, impaired flexibility, and pain.   ACTIVITY LIMITATIONS: carrying, standing, squatting, stairs, transfers, and locomotion level  PARTICIPATION LIMITATIONS: meal prep, cleaning, laundry, shopping, community activity, and occupation  PERSONAL FACTORS: Fitness and 3+ comorbidities: L foot ulcer, Wb'ing, long covid, lumbar surgery, chronic back pain, arthritis, depression  are also affecting patient's functional outcome.   REHAB POTENTIAL: good for goals  CLINICAL DECISION MAKING: Evolving/moderate complexity  EVALUATION COMPLEXITY: Low   GOALS:   SHORT TERM GOALS: Target date: 05/01/2023   Pt will perform HEP without adverse effects for improved ROM and strength.  Baseline: Goal status:  INITIAL    LONG TERM GOALS: Target date: 05/29/2023  Pt will demo 0 deg of R knee extension AROM and at least 115 deg of bilat knee flexion AROM for improved stiffness and mobility and for improved gait when he is able to ambulate more.   Baseline:  Goal status: INITIAL  2.  Pt will be independent and compliant with HEP for improved pain, ROM, strength, and function.  Baseline:  Goal status: INITIAL  3.  Pt will report at least a 50% improvement in stamina/tolerance with daily activities.  Baseline:  Goal status: INITIAL    PLAN:  PT FREQUENCY:  once every other week  PT DURATION: 8 weeks  PLANNED INTERVENTIONS: Therapeutic exercises, Therapeutic activity, Neuromuscular re-education, Balance training, Gait training, Patient/Family education, Self Care, Joint mobilization, Stair training, DME instructions, Electrical stimulation, Spinal mobilization, Cryotherapy, Moist heat, Taping, Ultrasound, Manual therapy, and Re-evaluation.  PLAN FOR NEXT SESSION: Avoid WB'ing of L LE until more healing has taken place and wound care.  Establish HEP.  Supine SLR, S/L abd, and LAQ next visit.  Knee ROM.     Audie Clear III PT, DPT 04/17/23 10:39 AM

## 2023-04-18 ENCOUNTER — Encounter (HOSPITAL_BASED_OUTPATIENT_CLINIC_OR_DEPARTMENT_OTHER): Payer: Self-pay | Admitting: Physical Therapy

## 2023-04-18 NOTE — Progress Notes (Signed)
DONEVAN, CLYNE ([id number]) [phone].pdf Page 1 of 8 Visit Report for 04/13/2023 Arrival Information Details Patient Name: Date of Service: [name], TO [name] 04/13/2023 9:30 A M Medical Record Number: [id number] Patient Account Number: [id number] Date of Birth/Sex: Treating RN: 09-22-1975 (47 y.o. [name] Primary Care Jernee Murtaugh: Dorinda Hill Other Clinician: Referring Merrianne Mccumbers: Treating Aerielle Stoklosa/Extender: Jana Hakim in Treatment: 143 Visit Information History Since Last Visit All ordered tests and consults were completed: Yes Patient Arrived: Wheel Chair Added or deleted any medications: No Arrival Time: 09:33 Any new allergies or adverse reactions: No Accompanied By: family Had a fall or experienced change in No Transfer Assistance: None activities of daily living that may affect Patient Requires Transmission-Based Precautions: No risk of falls: Patient Has Alerts: No Signs or symptoms of abuse/neglect since last visito No Hospitalized since last visit: No Implantable device outside of the clinic excluding No cellular tissue based products placed in the center since last visit: Has Dressing in Place as Prescribed: Yes Pain Present Now: No Electronic Signature(s) Signed: 04/18/2023 3:48:27 PM By: Brenton Grills Entered By: Brenton Grills on 04/13/2023 09:34:28 -------------------------------------------------------------------------------- Encounter Discharge Information Details Patient Name: Date of Service: [name], [name] 04/13/2023 9:30 A M Medical Record Number: [id number] Patient Account Number: [id number] Date of Birth/Sex: Treating RN: Sep 24, 1975 (47 y.o. [name] Primary Care Leocadio Heal: Dorinda Hill Other Clinician: Referring [name] [name]: Treating Telsa Dillavou/Extender: Jana Hakim in Treatment: [phone] Encounter Discharge Information Items Post Procedure Vitals Discharge Condition:  Stable Temperature (F): 98.3 Ambulatory Status: Wheelchair Pulse (bpm): 84 Discharge Destination: Home Respiratory Rate (breaths/min): 18 Transportation: Private Auto Blood Pressure (mmHg): 148/72 Accompanied By: family Schedule Follow-up Appointment: Yes Clinical Summary of Care: Patient Declined Electronic Signature(s) Signed: 04/18/2023 3:48:27 PM By: Brenton Grills Entered By: Brenton Grills on 04/13/2023 10:01:16 [name] ([id number]) [id number].pdf Page 2 of 8 -------------------------------------------------------------------------------- Lower Extremity Assessment Details Patient Name: Date of Service: [name] 04/13/2023 9:30 A M Medical Record Number: [id number] Patient Account Number: [id number] Date of Birth/Sex: Treating RN: 05/21/1976 (47 y.o. [name] Primary Care Dekendrick Uzelac: Dorinda Hill Other Clinician: Referring Jaylon Grode: Treating Zohra Clavel/Extender: Jana Hakim in Treatment: 143 Edema Assessment Assessed: [name]: No] Franne Forts: No] Edema: [Left: N] [Right: o] Calf Left: Right: Point of Measurement: 29 cm From Medial Instep 43.5 cm Ankle Left: Right: Point of Measurement: 9 cm From Medial Instep 24.4 cm Vascular Assessment Pulses: Dorsalis Pedis Palpable: [Left:Yes] Extremity colors, hair growth, and conditions: Extremity Color: [Left:Normal] Hair Growth on Extremity: [Left:Yes] Temperature of Extremity: [Left:Warm] Capillary Refill: [Left:< 3 seconds] Dependent Rubor: [Left:No No] Toe Nail Assessment Left: Right: Thick: Yes Discolored: Yes Deformed: Yes Improper Length and Hygiene: Yes Electronic Signature(s) Signed: 04/18/2023 3:48:27 PM By: Brenton Grills Entered By: Brenton Grills on 04/13/2023 09:38:46 -------------------------------------------------------------------------------- Multi Wound Chart Details Patient Name: Date of Service: [name], [name] 04/13/2023 9:30 A  M Medical Record Number: [id number] Patient Account Number: [id number] Date of Birth/Sex: Treating RN: [birth year] (47 y.o. M) Primary Care Haydin Calandra: Dorinda Hill Other Clinician: Referring Jenniger Figiel: Treating Laquentin Loudermilk/Extender: Jana Hakim in Treatment: 143 Vital Signs Height(in): 69 Pulse(bpm): 83 Weight(lbs): 280 Blood Pressure(mmHg): 135/82 Body Mass Index(BMI): 41.3 [name], [name] ([id number]) [phone].pdf Page 3 of 8 Temperature(F): 97.8 Respiratory Rate(breaths/min): 18 [2:Photos: No Photos Left Calcaneus Wound Location: Pressure Injury Wounding Event: Pressure Ulcer Primary Etiology: Asthma, Angina, Hypertension Comorbid History: 10/07/2019 Date Acquired: 143 Weeks of Treatment: Open Wound Status:  No Wound Recurrence: 3.2x1.2x0.2  Measurements L x W x D (cm) 3.016 A (cm) : rea 0.603 Volume (cm) : 88.90% % Reduction in A rea: 97.80% % Reduction in Volume: Category/Stage III Classification: Medium Exudate A mount: Serosanguineous Exudate Type: red, brown Exudate Color: Distinct,  outline attached Wound Margin: Large (67-100%) Granulation A mount: Pink Granulation Quality: Small (1-33%) Necrotic A mount: Fat Layer (Subcutaneous Tissue): Yes N/A Exposed Structures: Fascia: No Tendon: No Muscle: No Joint: No Bone: No Small (1-33%)  Epithelialization: Debridement - Selective/Open Wound N/A Debridement: Pre-procedure Verification/Time Out 09:49 Taken: Lidocaine 4% T opical Solution Pain Control: Callus, Slough Tissue Debrided: Non-Viable Tissue Level: 3.01 Debridement A (sq cm): rea  Curette Instrument: Minimum Bleeding: Pressure Hemostasis A chieved: Procedure was tolerated well Debridement Treatment Response: 3.2x1.2x0.2 Post Debridement Measurements L x W x D (cm) 0.603 Post Debridement Volume: (cm) Category/Stage III Post  Debridement Stage: Scarring: Yes Periwound Skin Texture: Excoriation: No Induration: No Callus: No Crepitus: No Rash: No  Dry/Scaly: Yes Periwound Skin Moisture: Maceration: No Atrophie Blanche: No Periwound Skin Color: Cyanosis: No Ecchymosis: No  Erythema: No Hemosiderin Staining: No Mottled: No Pallor: No Rubor: No No Abnormality Temperature: Yes Tenderness on Palpation: Debridement Procedures Performed:] [N/A:N/A N/A N/A N/A N/A N/A N/A N/A N/A N/A N/A N/A N/A N/A N/A N/A N/A N/A N/A N/A N/A  N/A N/A N/A N/A N/A N/A N/A N/A N/A N/A N/A N/A N/A N/A N/A N/A N/A N/A N/A N/A] Treatment Notes Wound #2 (Calcaneus) Wound Laterality: Left Cleanser Normal Saline Discharge Instruction: Cleanse the wound with Normal Saline prior to applying a clean dressing using gauze sponges, not tissue or cotton balls. Wound Cleanser Discharge Instruction: Cleanse the wound with wound cleanser prior to applying a clean dressing using gauze sponges, not tissue or cotton balls. Peri-Wound Care Ketoconazole Cream 2% Discharge Instruction: Apply Ketoconazole mixed with zinc to the periwound [name], [name] ([id number]) [phone].pdf Page 4 of 8 Zinc Oxide Ointment 30g tube Discharge Instruction: Apply Zinc Oxide to periwound with each dressing change Topical Gentamicin Discharge Instruction: apply to the wound bed Primary Dressing Maxorb Extra Ag+ Alginate Dressing, 4x4.75 (in/in) Discharge Instruction: Apply to wound bed as instructed Secondary Dressing Drawtex 4x4 in Discharge Instruction: Apply over primary dressing as directed. Optifoam Non-Adhesive Dressing, 4x4 in Discharge Instruction: Apply over primary dressing as directed. Woven Gauze Sponge, Non-Sterile 4x4 in Discharge Instruction: Apply over primary dressing as directed. Secured With 40M Medipore H Soft Cloth Surgical T ape, 4 x 10 (in/yd) Discharge Instruction: Secure with tape as directed. Compression Wrap Kerlix Roll 4.5x3.1 (in/yd) Discharge Instruction: Apply Kerlix and Coban compression as directed. Compression  Stockings Add-Ons [place] Vascular Offloading Boot, Size Regular Electronic Signature(s) Signed: 04/13/2023 10:03:48 AM By: [name] MD FACS Entered By: [name] on 04/13/2023 10:03:48 -------------------------------------------------------------------------------- Multi-Disciplinary Care Plan Details Patient Name: Date of Service: [name], TO [name] 04/13/2023 9:30 A M Medical Record Number: [id number] Patient Account Number: [id number] Date of Birth/Sex: Treating RN: 1976/05/06 (47 y.o. [name] Primary Care Reida Hem: Dorinda Hill Other Clinician: Referring Alphonsa Brickle: Treating Stokely Jeancharles/Extender: Jana Hakim in Treatment: 143 Multidisciplinary Care Plan reviewed with physician Active Inactive Wound/Skin Impairment Nursing Diagnoses: Impaired tissue integrity Knowledge deficit related to ulceration/compromised skin integrity Goals: Patient/caregiver will verbalize understanding of skin care regimen Date Initiated: 07/15/2020 Target Resolution Date: 05/08/2023 Goal Status: Active Ulcer/skin breakdown will have a volume reduction of 30% by week 4 Date Initiated: 07/15/2020 Date Inactivated: 08/20/2020 Target Resolution Date: 09/03/2020 Goal  Status: Unmet Unmet Reason: no major changes. Ulcer/skin breakdown will heal within 14 weeks [name], [name] ([id number]) [phone].pdf Page 5 of 8 Date Initiated: 12/04/2020 Date Inactivated: 12/10/2020 Target Resolution Date: 12/10/2020 Unmet Reason: wounds still open at 14 Goal Status: Unmet weeks and today 21 weeks. Interventions: Assess patient/caregiver ability to obtain necessary supplies Assess patient/caregiver ability to perform ulcer/skin care regimen upon admission and as needed Assess ulceration(s) every visit Provide education on ulcer and skin care Treatment Activities: Skin care regimen initiated : 07/15/2020 Topical wound management initiated :  07/15/2020 Notes: Electronic Signature(s) Signed: 04/18/2023 3:48:27 PM By: Brenton Grills Entered By: Brenton Grills on 04/13/2023 09:44:27 -------------------------------------------------------------------------------- Pain Assessment Details Patient Name: Date of Service: [name], [name] 04/13/2023 9:30 A M Medical Record Number: [id number] Patient Account Number: [id number] Date of Birth/Sex: Treating RN: [birth year] (47 y.o. [name] Primary Care Karley Pho: Dorinda Hill Other Clinician: Referring Demeshia Sherburne: Treating Lurlean Kernen/Extender: Jana Hakim in Treatment: 143 Active Problems Location of Pain Severity and Description of Pain Patient Has Paino No Site Locations Pain Management and Medication Current Pain Management: Electronic Signature(s) Signed: 04/18/2023 3:48:27 PM By: Brenton Grills Entered By: Brenton Grills on 04/13/2023 09:38:31 [name] ([id number]) [id number].pdf Page 6 of 8 -------------------------------------------------------------------------------- Patient/Caregiver Education Details Patient Name: Date of Service: [name] 9/5/2024andnbsp9:30 A M Medical Record Number: [id number] Patient Account Number: [id number] Date of Birth/Gender: Treating RN: 08-27-1975 (47 y.o. [name] Primary Care Physician: Dorinda Hill Other Clinician: Referring Physician: Treating Physician/Extender: Jana Hakim in Treatment: 143 Education Assessment Education Provided To: Patient and Caregiver Education Topics Provided Wound/Skin Impairment: Methods: Explain/Verbal Responses: State content correctly Electronic Signature(s) Signed: 04/18/2023 3:48:27 PM By: Brenton Grills Entered By: Brenton Grills on 04/13/2023 09:44:46 -------------------------------------------------------------------------------- Wound Assessment Details Patient Name: Date of Service: [name], TO [place]  E. 04/13/2023 9:30 A M Medical Record Number: [id number] Patient Account Number: [id number] Date of Birth/Sex: Treating RN: 10-Dec-1975 (47 y.o. [name] Primary Care Jeweldean Drohan: Dorinda Hill Other Clinician: Referring Maggy Wyble: Treating Jamyson Jirak/Extender: Jana Hakim in Treatment: 143 Wound Status Wound Number: 2 Primary Etiology: Pressure Ulcer Wound Location: Left Calcaneus Wound Status: Open Wounding Event: Pressure Injury Comorbid History: Asthma, Angina, Hypertension Date Acquired: 10/07/2019 Weeks Of Treatment: 143 Clustered Wound: No Wound Measurements Length: (cm) 3.2 Width: (cm) 1.2 Depth: (cm) 0.2 Area: (cm) 3.016 Volume: (cm) 0.603 % Reduction in Area: 88.9% % Reduction in Volume: 97.8% Epithelialization: Small (1-33%) Tunneling: No Undermining: No Wound Description Classification: Category/Stage III Wound Margin: Distinct, outline attached Exudate Amount: Medium Exudate Type: Serosanguineous Exudate Color: red, brown Foul Odor After Cleansing: No Slough/Fibrino No Wound Bed Granulation Amount: Large (67-100%) Exposed Structure Granulation Quality: Pink Fascia Exposed: No [name], [name] ([id number]) [phone].pdf Page 7 of 8 Necrotic Amount: Small (1-33%) Fat Layer (Subcutaneous Tissue) Exposed: Yes Necrotic Quality: Adherent Slough Tendon Exposed: No Muscle Exposed: No Joint Exposed: No Bone Exposed: No Periwound Skin Texture Texture Color No Abnormalities Noted: No No Abnormalities Noted: No Callus: No Atrophie Blanche: No Crepitus: No Cyanosis: No Excoriation: No Ecchymosis: No Induration: No Erythema: No Rash: No Hemosiderin Staining: No Scarring: Yes Mottled: No Pallor: No Moisture Rubor: No No Abnormalities Noted: Yes Temperature / Pain Temperature: No Abnormality Tenderness on Palpation: Yes Treatment Notes Wound #2 (Calcaneus) Wound Laterality: Left Cleanser Normal  Saline Discharge Instruction: Cleanse the wound with Normal Saline prior to applying a clean dressing using gauze sponges, not tissue or cotton balls.  Wound Cleanser Discharge Instruction: Cleanse the wound with wound cleanser prior to applying a clean dressing using gauze sponges, not tissue or cotton balls. Peri-Wound Care Ketoconazole Cream 2% Discharge Instruction: Apply Ketoconazole mixed with zinc to the periwound Zinc Oxide Ointment 30g tube Discharge Instruction: Apply Zinc Oxide to periwound with each dressing change Topical Gentamicin Discharge Instruction: apply to the wound bed Primary Dressing Maxorb Extra Ag+ Alginate Dressing, 4x4.75 (in/in) Discharge Instruction: Apply to wound bed as instructed Secondary Dressing Drawtex 4x4 in Discharge Instruction: Apply over primary dressing as directed. Optifoam Non-Adhesive Dressing, 4x4 in Discharge Instruction: Apply over primary dressing as directed. Woven Gauze Sponge, Non-Sterile 4x4 in Discharge Instruction: Apply over primary dressing as directed. Secured With 64M Medipore H Soft Cloth Surgical T ape, 4 x 10 (in/yd) Discharge Instruction: Secure with tape as directed. Compression Wrap Kerlix Roll 4.5x3.1 (in/yd) Discharge Instruction: Apply Kerlix and Coban compression as directed. Compression Stockings Add-Ons [place] Vascular Offloading Boot, Size Regular Electronic Signature(s) Signed: 04/18/2023 3:48:27 PM By: Brenton Grills Entered By: Brenton Grills on 04/13/2023 09:40:03 [name] ([id number]) [id number].pdf Page 8 of 8 -------------------------------------------------------------------------------- Vitals Details Patient Name: Date of Service: [name], TO [name] 04/13/2023 9:30 A M Medical Record Number: [id number] Patient Account Number: [id number] Date of Birth/Sex: Treating RN: 12/02/1975 (47 y.o. [name] Primary Care Shikara Mcauliffe: Dorinda Hill Other Clinician: Referring  Laqueisha Catalina: Treating Emrys Mckamie/Extender: Jana Hakim in Treatment: 143 Vital Signs Time Taken: 09:34 Temperature (F): 97.8 Height (in): 69 Pulse (bpm): 83 Weight (lbs): 280 Respiratory Rate (breaths/min): 18 Body Mass Index (BMI): 41.3 Blood Pressure (mmHg): 135/82 Reference Range: 80 - 120 mg / dl Electronic Signature(s) Signed: 04/18/2023 3:48:27 PM By: Brenton Grills Entered By: Brenton Grills on 04/13/2023 09:35:00

## 2023-04-18 NOTE — Progress Notes (Signed)
[name], [name] ([id number]) [id number] Page 1 of 17 Visit Report for 04/13/2023 Chief Complaint Document Details Patient Name: Date of Service: [name], TO [name] 04/13/2023 9:30 A M Medical Record Number: [id number] Patient Account Number: [id number] Date of Birth/Sex: Treating RN: 01-11-1976 (47 y.o. M) Primary Care Provider: Dorinda Hill Other Clinician: Referring Provider: Treating Provider/Extender: Jana Hakim in Treatment: 143 Information Obtained from: Patient Chief Complaint Bilateral Plantar Foot Ulcers Electronic Signature(s) Signed: 04/13/2023 10:03:57 AM By: [name] MD FACS Entered By: [name] on 04/13/2023 07:03:57 -------------------------------------------------------------------------------- Debridement Details Patient Name: Date of Service: [name], [name] 04/13/2023 9:30 A M Medical Record Number: [id number] Patient Account Number: [id number] Date of Birth/Sex: Treating RN: [birth year] (47 y.o. [name] Primary Care Provider: Dorinda Hill Other Clinician: Referring Provider: Treating Provider/Extender: Jana Hakim in Treatment: 143 Debridement Performed for Assessment: Wound #2 Left Calcaneus Performed By: Physician [name], MD Debridement Type: Debridement Level of Consciousness (Pre-procedure): Awake and Alert Pre-procedure Verification/Time Out Yes - 09:49 Taken: Start Time: 09:50 Pain Control: Lidocaine 4% Topical Solution Percent of Wound Bed Debrided: 100% T Area Debrided (cm): otal 3.01 Tissue and other material debrided: Non-Viable, Callus, Slough, Slough Level: Non-Viable Tissue Debridement Description: Selective/Open Wound Instrument: Curette Bleeding: Minimum Hemostasis Achieved: Pressure Response to Treatment: Procedure was tolerated well Level of Consciousness (Post- Awake and Alert procedure): Post Debridement Measurements of Total  Wound Length: (cm) 3.2 Stage: Category/Stage III Width: (cm) 1.2 Depth: (cm) 0.2 Volume: (cm) 0.603 Character of Wound/Ulcer Post Debridement: Improved Post Procedure Diagnosis Same as [name], [name] ([id number]) [id number] Page 2 of 17 Notes Scribed for Dr Lady Gary by Brenton Grills RN. Electronic Signature(s) Signed: 04/13/2023 10:28:43 AM By: [name] MD FACS Signed: 04/18/2023 3:48:27 PM By: Brenton Grills Entered By: Brenton Grills on 04/13/2023 06:50:15 -------------------------------------------------------------------------------- HPI Details Patient Name: Date of Service: [name], [name] 04/13/2023 9:30 A M Medical Record Number: [id number] Patient Account Number: [id number] Date of Birth/Sex: Treating RN: Sep 08, 1975 (47 y.o. M) Primary Care Provider: Dorinda Hill Other Clinician: Referring Provider: Treating Provider/Extender: Jana Hakim in Treatment: 143 History of Present Illness HPI Description: Wounds are12/03/2020 upon evaluation today patient presents for initial inspection here in our clinic concerning issues he has been having with the bottoms of his feet bilaterally. He states these actually occurred as wounds when he was hospitalized for 5 months secondary to Covid. He was apparently with tilting bed where he was in an upright position quite frequently and apparently this occurred in some way shape or form during that time. Fortunately there is no sign of active infection at this time. No fevers, chills, nausea, vomiting, or diarrhea. With that being said he still has substantial wounds on the plantar aspects of his feet Theragen require quite a bit of work to get these to heal. He has been using Santyl currently though that is been problematic both in receiving the medication as well as actually paid for it as it is become quite expensive. Prior to the experience with Covid the patient really did  not have any major medical problems other than hypertension he does have some mild generalized weakness following the Covid experience. 07/22/2020 on evaluation today patient appears to be doing okay in regard to his foot ulcers I feel like the wound beds are showing signs of better improvement that I do believe the Iodoflex is helping in this regard. With that being said he does have  a lot of drainage currently and this is somewhat blue/green in nature which is consistent with Pseudomonas. I do think a culture today would be appropriate for [place] to evaluate and see if that is indeed the case I would likely start him on antibiotic orally as well he is not allergic to Cipro knows of no issues he has had in the past 12/21; patient was admitted to the clinic earlier this month with bilateral presumed pressure ulcers on the bottom of his feet apparently related to excessive pressure from a tilt table arrangement in the intensive care unit. Patient relates this to being on ECMO but I am not really sure that is exactly related to that. I must say I have never seen anything like this. He has fairly extensive full-thickness wounds extending from his heel towards his midfoot mostly centered laterally. There is already been some healing distally. He does not appear to have an arterial issue. He has been using gentamicin to the wound surfaces with Iodoflex to help with ongoing debridement 1/6; this is a patient with pressure ulcers on the bottom of his feet related to excessive pressure from a standing position in the intensive care unit. He is complaining of a lot of pain in the right heel. He is not a diabetic. 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 We have been using Iodoflex to help prepare the surfaces of both wounds for an advanced treatment product. He is nonambulatory spending most of his time in a wheelchair I have asked him not to propel the wheelchair with his heels 1/13; in  general his wounds look better not much surface area change we have been using Iodoflex as of last week. I did an x-ray of the right heel as the patient was complaining of pain. I had some thoughts about a stress fracture perhaps Achilles tendon problems however what it showed was erosive changes along the inferior aspect of the calcaneus he now has a MRI booked for 1/20. 1/20; in general his wounds continue to be better. Some improvement in the large narrow areas proximally in his foot. He is still complaining of pain in the right heel and tenderness in certain areas of this wound. His MRI is tonight. I am not just looking for osteomyelitis that was brought up on the x-ray I am wondering about stress fractures, tendon ruptures etc. He has no such findings on the left. Also noteworthy is that the patient had critical illness neuropathy and some of the discomfort may be actual improvement in nerve function I am just not sure. These wounds were initially in the setting of severe critical illness related to COVID-19. He was put in a standing position. He may have also been on pressors at the point contributing to tissue ischemia. By his description at some point these wounds were grossly necrotic extending proximally up into the Achilles part of his heel. I do not know that I have ever really seen pictures of them like this although they may exist in epic We have ordered Tri layer Oasis. I am trying to stimulate some granulation in these areas. This is of course assuming the MRI is negative for infection 1/27; since the patient was last here he saw Dr. Earlene Plater of infectious disease. He is planned for vancomycin and ceftriaxone. Prior operative culture grew MSSA. Also ordered baseline lab work. He also ordered arterial studies although the ABIs in our clinic were normal as well as his clinical exam these were normal  I do not think he needs to see vascular surgery. His ABIs at the [facility] were 1.22 in the right  triphasic waveforms with a normal TBI of 1.15 on the left ABI of 1.22 with triphasic waveforms and a normal TBI of 1.08. Finally he saw Dr. Logan Bores who will follow him in for 2 months. At this point I do not think he felt that he needed a procedure on the right calcaneal bone. Dr. Earlene Plater is elected for broad-spectrum antibiotic The patient is still having pain in the right heel. He walks with a walker 2/3; wounds are generally smaller. He is tolerating his IV antibiotics. I believe this is vancomycin and ceftriaxone. We are still waiting for Oasis burn in terms of his out-of-pocket max which he should be meeting soon given the IV antibiotics, MRIs etc. I have asked him to check in on this. We are using silver collagen in the meantime the wounds look better 2/10; tolerating IV vancomycin and Rocephin. We are waiting to apply for Oasis. Although I am not really sure where he is in his out-of-pocket max. 2/17 started the first application of Oasis trilayer. Still on antibiotics. The wounds have generally look better. The area on the left has a little more surface slough requiring debridement 2/24; second application of Oasis trilayer. The wound surface granulation is generally look better. The area on the left with undermining laterally I think is come in a bit. [name], [name] ([id number]) [id number] Page 3 of 17 10/08/2020 upon evaluation today patient is here today for [employer] application #3. Fortunately he seems to be doing extremely well with regard to this and we are seeing a lot of new epithelial growth which is great news. Fortunately there is no signs of active infection at this time. 10/16/2020 upon evaluation today patient appears to be doing well with regard to his foot ulcers. Do believe the Oasis has been of benefit for him. I do not see any signs of infection right now which is great news and I think that he has a lot of new epithelial growth which is great  to see as well. The patient is very pleased to hear all of this. I do think we can proceed with the Oasis trilayer #4 today. 3/18; not as much improvement in these areas on his heels that I was hoping. I did reapply trilateral Oasis today the tissue looks healthier but not as much fill in as I was hoping. 3/25; better looking today I think this is come in a bit the tissue looks healthier. Triple layer Oasis reapplied #6 4/1; somewhat better looking definitely better looking surface not as much change in surface area as I was hoping. He may be spending more time [place] on days then he needs to although he does have heel offloading boots. Triple layer Oasis reapplied #7 4/7; unfortunately apparently [facility] will not approve any further Oasis which is unfortunate since the patient did respond nicely both in terms of the condition of the wound bed as well as surface area. There is still some drainage coming from the wound but not a lot there does not appear to be any infection 4/15; we have been using Hydrofera Blue. He continues to have nice rims of epithelialization on the right greater than the left. The left the epithelialization is coming from the tip of his heel. There is moderate drainage. In this that concerns me about a total contact cast. There is no evidence of infection  4/29; patient has been using Hydrofera Blue with dressing changes. He has no complaints or issues today. 5/5; using Hydrofera Blue. I actually think that he looks marginally better than the last time I saw this 3 weeks ago. There are rims of epithelialization on the left thumb coming from the medial side on the right. Using Hydrofera Blue 5/12; using Hydrofera Blue. These continue to make improvements in surface area. His drainage was not listed as severe I therefore went ahead and put a cast on the left foot. Right foot we will continue to dress his previous 5/16; back for first total contact cast change. He  did not tolerate this particularly well cast injury on the anterior tibia among other issues. Difficulty sleeping. I talked him about this in some detail and afterwards is elected to continue. I told him I would like to have a cast on for 3 weeks to see if this is going to help at all. I think he agreed 5/19; I think the wound is better. There is no tunneling towards his midfoot. The undermining medially also looks better. He has a rim of new skin distally. I think we are making progress here. The area on the left also continues to look somewhat better to me using Hydrofera Blue. He has a list of complaints about the cast but none of them seem serious 5/26; patient presents for 1 week follow-up. He has been using a total contact cast and tolerating this well. Hydrofera Blue is the main dressing used. He denies signs of infection. 6/2 Hydrofera Blue total contact cast on the left. These were large ulcers that formed in intensive care unit where the patient was recovering from COVID. May have had something to do with being ventilated in an upright positiono Pressors etc. We have been able to get the areas down considerably and a viable surface. There is some epithelialization in both sides. Note made of drainage 6/9; changed to [facility] last time because of drainage. He arrives with better looking surfaces and dimensions on the left than the right. Paradoxically the right actually probes more towards his midfoot the left is largely close down but both of these look improved. Using a total contact cast on the left 6/16; complex wounds on his bilateral plantar heels which were initially pressure injury from a stay in the ICU with COVID. We have been using silver alginate most recently. His dimensions of come in quite dramatically however not recently. We have been putting the left foot in a total contact cast 6/23; complex wounds on the bilateral plantar heels. I been putting the left in the cast  paradoxically the area on the right is the one that is going towards closure at a faster rate. Quite a bit of drainage on the left. The patient went to see Dr. Logan Bores who said he was going to standby for skin grafts. I had actually considered sending him for skin grafts however he would be mandatorily off his feet for a period of weeks to months. I am thinking that the area on the right is going to close on its own the area on the left has been more stubborn even though we have him in a total contact cast 6/30; took him out of a total contact cast last week is the right heel seem to be making better progress than the left where I was placing the cast. We are using silver alginate. Both wounds are smaller right greater than left 7/12; both wounds  look as though they are making some progress. We are using silver alginate. Heel offloading boots 7/26; very gradual progress especially on the right. Using silver alginate. He is wearing heel offloading boots 8/18; he continues to close these wounds down very gradually. Using silver alginate. The problem polymen being definitive about this is areas of what appears to be callus around the margins. This is not a 100% of the area but certainly sizable especially on the right 9/1; bilateral plantar feet wounds secondary to prolonged pressure while being ventilated for COVID-19 in an upright position. Essentially pressure ulcers on the bottom of his feet. He is made substantial progress using silver alginate. 9/14; bilateral plantar feet wounds secondary to prolonged pressure. Making progress using silver alginate. 9/29 bilateral plantar feet wounds secondary to prolonged pressure. I changed him to Iodoflex last week. MolecuLight showing reddened blush fluorescence 10/11; patient presents for follow-up. He has no issues or complaints today. He denies signs of infection. He continues to use Iodoflex and antibiotic ointment to the wound beds. 10/27; 2-week  follow-up. No evidence of infection. He has callus and thick dry skin around the wound margins we have been using Iodoflex and Bactroban which was in response to a moderate left MolecuLight reddish blush fluorescence. 11/10; 2-week follow-up. Wound margins again have thick callus however the measurements of the actual wound sites are a lot smaller. Everything looks reasonably healthy here. We have been using Iodoflex He was approved for prime matrix but I have elected to delay this given the improvement in the surface area. Hopefully I will not regret that decision as were getting close to the end of the year in terms of insurance payment 12/8; 2-week follow-up. Wounds are generally smaller in size. These were initially substantial wounds extending into the forefoot all the way into the heel on the bilateral plantar feet. They are now both located on the plantar heel distal aspect both of these have a lot of callus around the wounds I used a #5 curette to remove this on the right and the left also some subcutaneous debris to try and get the wound edges were using Iodoflex. He has heel offloading shoe 12/22; 2-week follow-up. Not really much improvement. He has thick callus around the outer edges of both wounds. I remove this there is some nonviable subcutaneous tissue as well. We have been using Iodoflex. Her intake nurse and myself spontaneously thought of a total contact cast I went back in May. At that time we really were not seeing much of an improvement with a cast although the wound was in a much different situation I would like to retry this in 2 weeks and I discussed this with the patient 08/12/2021; the patient has had some improvement with the Iodoflex. The the area on the left heel plantar more improved than the right. I had to put him in a total contact cast on the left although I decided to put that off for 2 weeks. I am going to change his primary dressing to silver collagen. I think in  both areas he has had some improvement most of the healing seems to be more proximal in the heel. The wounds are in the mid aspect. A lot of thick callus on the right heel however. 1/19; we are using silver collagen on both plantar heel areas. He has had some improvement today. The left did not require any debridement. He still had some eschar on the right that was debrided but both seem to  have contracted. I did not put it total contact cast on him today 2/2 we have been using silver collagen. The area on the right plantar heel has areas that appear to be epithelialized interspersed with dry flaking callus and dry skin. I removed this. This really looks better than on the other side. On the left still a large area with raised edges and debris on the surface. 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 He has no issues or complaints today. 2/9; not much change in the left foot wound with 1 week of a cast we are using silver collagen. Silver collagen on the right side. The right side has been the better wound surface. We will reapply the total contact cast on the left 2/16; not much improvement on either side I been using silver collagen with a total contact cast on the left. I'm changing the Hydrofera Blue still with a total contact cast on the left 2/23; some improvement on both sides. Disappointing that he has thick callus around the area that we are putting in a total contact cast on the left. We've been using Hydrofera Blue on both wound areas. [name], [name] ([id number]) [id number] Page 4 of 17 This is a man who at essentially pressure ulcers in addition to ischemia caused by medications to support his blood pressure (pressors) in the ICU. He was being ventilated in the standing position for severe Covid. A Shiley the wounds extended  across his entire foot but are now localized to his plantar heels bilaterally. We have made progress however neither areas healed. I continue to think the total contact cast is helped albeit painstakingly slowly. He has never wanted a plastic surgery consult although I don't know that they would be interested in grafting in area in this location. 10/07/2021: Continued improvement bilaterally. There is still some callus around the left wound, despite the total contact cast. He has some increased pain in his right midfoot around 1 particular area. This has been painful in the past but seems to be a little bit worse. When his cast was removed today, there was an area on the heel of the left foot that looks a bit macerated. He is also complaining of pain in his left thigh and hip which he thinks is secondary to the limb length discrepancy caused by the cast. 10/14/2021: He continues to improve. A little bit less callus around the left wound. He continues to endorse pain in his right midfoot, but this is not as significant as it was last week. The maceration on his left heel is improved. 10/21/2021: Continued improvement to both wounds. The maceration on his left heel is no longer evident. Less callus bilaterally. Epithelialization progressing. 10/28/2021: Significant improvement this week. The right sided wound is nearly closed with just a small open area at the middle. No maceration seen on the left heel. Continued epithelialization on both sides. No concern for infection. 11/04/2021: T oday, the wounds were measured a little bit differently and come out as larger, but I actually think they are about the same to potentially even smaller, particularly on the left. He continues to accumulate some callus on the right. 11/11/2021: T oday, the patient is expressing some concern that the left wound, despite being in the total contact cast, is not progressing at the same rate as the 1 on the right.  He is interested in  trying a week without the cast to see how the wound does. The wounds are roughly the same size as last week, with the right perhaps being a little bit smaller. He continues to build up callus on both sites. 11/18/2021: Last week, I permitted the patient to go without his total contact cast, just to see if the cast was really making any difference. Today, both wounds have deteriorated to some extent, suggesting that the cast is providing benefit, at least on the left. Both are larger and have accumulated callus, slough, and other debris. 11/26/2021: I debrided both wounds quite aggressively last week in an effort to stimulate the healing cascade. This appears to have been effective as the left sided wound is a full centimeter shorter in length. Although the right was measured slightly larger, on inspection, it looks as though an area of epithelialized tissue was included in the measurements. We have been using PolyMem Ag on the wound surfaces with a total contact cast to the left. 12/02/2021: It appears that the intake personnel are including epithelialized tissue in his wound measurements; the right wound is almost completely epithelialized; there is just a crater at the proximal midfoot with some open areas. On the left, he has built up some callus, but the overall wound surface looks good. There is some senescent skin around the wound margin. He has been in PolyMem Ag bilaterally with a total contact cast on the left. 12/09/2021: The right wound is nearly closed; there is just a small open area at the mid calcaneus. On the left, the wound is smaller with minimal callus buildup. No significant drainage. 12/16/2021: The right calcaneal wound remains minimally open at the mid calcaneus; the rest has epithelialized. On the left, the wound is also a little bit smaller. There is some senescent tissue on the periphery. He is getting his first application of a trial skin substitute called Vendaje  today. 12/23/2021: The wound on his right calcaneus is nearly closed; there is just a small area at the most distal aspect of the calcaneus that is open. On the left, the area where we applied to the skin substitute has a healthier-looking bed of granulation tissue. The wound dimensions are not significantly different on this side but the wound surface is improved. 12/30/2021: The wound on the right calcaneus has not changed significantly aside from some accumulation of callus. On the left, the open area is smaller and continues to have an improved surface. He continues to accumulate callus around the wound. He is here for his third application of Vendaje. 01/06/2022: The right calcaneal wound is down to just a couple of millimeters. He continues to accumulate periwound callus. He unfortunately got his cast wet earlier in the week and his left foot is macerated, resulting in some superficial skin loss just distal to the open wound. The open wound itself, however, is much smaller and has a healthier appearing surface. He is here for his fourth application of Vendaje. 01/13/2022: The right calcaneal wound is about the same. Unfortunately, once again, his cast got wet and his foot is again macerated. This is caused the left calcaneal wound to enlarge. He is here for his fifth application of Vendaje. 01/20/2022: The right calcaneal wound is very small. There is some periwound callus accumulation. He purchased a new cast protector last week and this has been effective in avoiding the maceration that has been occurring on the left. The left calcaneal wound is narrower and  has a healthy and viable-appearing surface. He is here for his 6 application of Vendaje. 01/27/2022: The right calcaneal wound is down to just a pinhole. There is some periwound slough and callus. On the left, the wound is narrower and shorter by about a centimeter. The surface is robust and viable-appearing. Unfortunately, the rep for the trial  skin substitute product did not provide any for [place] to use today. 02/04/2022: The right calcaneal wound remains unchanged. There is more accumulated callus. On the left, although the intake nurse measured it a little bit longer, it looks about the same to me. The surface has a layer of slough, but underneath this, there is good granulation tissue. 02/10/2022: The right calcaneus wound is nearly closed. There is still some callus that builds up around the site. The left side looks about the same in terms of dimensions, but the surface is more robust and vital-appearing. 02/16/2022: The area of the right calcaneus that was nearly closed last week has closed, but there is a small opening at the mid foot where it looks like some moisture got retained and caused some reopening. The left foot wound is narrower and shallower. Both sites have a fair amount of periwound callus and eschar. 02/24/2022: The small midfoot opening on the right calcaneus is a little bit smaller today. The left foot wound is narrower and shallower. He continues to accumulate periwound callus. No concern for infection. 03/01/2022: The patient came to clinic early because he showered and got his cast wet. Fortunately, there is no significant maceration to his foot but the callus softened and it looks like the wound on his left calcaneus may be a little bit wider. The wound on his right calcaneus is just a narrow slit. Continued accumulation of periwound callus bilaterally. 03/08/2022: The wound on his right calcaneus is very nearly closed, just a small pinpoint opening under a bit of eschar; the left wound has come in quite a bit, as well. It is narrower and shorter than at our last visit. Still with accumulated callus and eschar bilaterally. 03/17/2022: The right calcaneal wound is healed. The left wound is smaller and the surface itself is very clean, but there is some blue-green staining on the periwound callus, concerning for Pseudomonas  aeruginosa. 03/23/2022: The right calcaneal wound remains closed. The left wound continues to contract. No further blue-green staining. Small amount of callus and slough accumulation. 03/28/2022: He came in early today because he had gotten his cast wet. On inspection, the wound itself did not get wet or macerated, just a little bit of the forefoot. The wound itself is basically unchanged. [name], [name] ([id number]) [id number] Page 5 of 17 04/07/2022: The right foot wound remains closed. The left wound is the smallest that I have seen it to date. It is narrower and shorter. It still continues to accumulate slough on the surface. 04/15/2022: There is a band of epithelium now dividing the small left plantar foot wound in 2. There is still some slough on the surface. 04/21/2022: The wound continues to narrow. Just a little bit of slough on the surface. He seems to be responding well to endoform. 04/28/2022: Continued slow contraction of the wound. There is a little slough on the surface and some periwound callus. We have been using endoform and total contact cast. 05/05/2022: The wound appears to have stalled. There is slough and some periwound eschar/callus. No concern for infection, however. 05/12/2022: Unfortunately, his right foot has reopened. It is located at the  most posterior aspect of his surgical incision. The area was noted to have drainage coming from it when his padding was removed today. Underneath some callus and senescent skin, there is an opening. No purulent drainage or malodor. On the left foot, the wound is again unchanged. There is some light blue staining on the callus, but no malodor or purulent drainage. 10/13; right and left heel remanence of extensive plantar foot wounds. These are better than I remember by quite a big margin however he is still left with wounds on the left plantar heel and the right plantar heel. Been using endoform bilaterally. A culture  was done that showed apparently Pseudomonas but we are still waiting for the [facility] antibiotic to use gentamicin today. He is still very active by description I am not sure about the offloading of his noncasted right foot 10/20; both wounds right and left heel debrided not much change from last week. [name] Echevaria has arrived which is linezolid, gentamicin and ciprofloxacin we will use this with endoform. T contact cast on the left otal 06/02/2022: Both wounds are smaller today. There is still a fair amount of callus buildup around the right foot ulcer. The left is more superficial and nearly flush with the surrounding tissues. Also with slough and eschar buildup. 06/10/2022: The right sided wound appears to be nearly closed, if not completely so, although it is somewhat difficult to tell given the abnormal tissue and scarring in his foot. There is a fair amount of callus and crust accumulation. On the left, the wound looks about the same, again with callus and slough. He has an appointment next Thursday with Dr. Annamary Rummage in podiatry; I am hopeful that there may be some reconstructive options available for Mr. Paola. 06/16/2022: Both wounds have some eschar and callus accumulation. The right sided wound is extremely narrow and barely open; the left is narrower than last week. There is a little bit of slough. He has his appointment in podiatry later today. 06/23/2022: The patient met with Dr. Annamary Rummage last week and unfortunately, there are no reconstructive options that he believes would be helpful. He did order an MRI to evaluate for osteomyelitis and fortunately, none was seen. The left sided wound is a little bit shorter and narrower today. The right sided wound is about the same. There is callus and eschar accumulation bilaterally. 06/29/2022: Both feet have improved from last week. There is epithelium making a valiant effort to creep across the surface on the left. The right side looks like it  got a little dry and the deep crevasse in his midfoot has cracked. Both have eschar and there is some slough on the left. 07/07/2022: Both feet have improved. There is epithelium completely covering the calcaneus on the right with just a small opening in the crevasse in his midfoot. On the left, the open area of tissue is smaller but he continues to build up callus/eschar and slough. 07/15/2022: The opening in the midfoot on the right is about the same size, covered with eschar and a little bit of slough. The open portion of the left wound is narrower and shorter with a bit of slough buildup. He admits to being on his feet more than recommended. 12/14; as far as I can tell everything on the right foot is closed. There is some eschar I removed some of this I cannot identify any open wound here. As usual this will be a very vulnerable area going forward. On the left this looks really quite  healthy. I was pleasantly surprised to see how good this looked. Wound is certainly smaller and there appears to be healthy epithelialization. He has been using Promogran on the right and endoform on the left. He has been offloading the right foot with a heel offloading boot and he has a running shoe on the right foot 08/04/2022: The right foot remains closed. He has a thick cushioned insole in his sneaker. The left sided wound is smaller with just some slough and eschar accumulation. He is wearing the heel off loader on this foot. 08/15/2022: The right foot remains closed. The left wound has narrowed further. There is some slough and eschar accumulation. 08/25/2022: We put him in a peg assist shoe insert and as a result, he has more epithelialization of the ulcer with minimal slough and eschar accumulation. 09/01/2022: The wound is smaller by about half this week. Still with some slough on the surface. The peg assist shoe seems to be doing a remarkable job of adequately offloading the site. 09/08/2022: There is a little  bit more epithelium coming in. There is some slough and callus buildup. 09/16/2022: The wound measures about the same size, but the epithelium that has grown in looks more robust and stronger. There is some slough and callus buildup. 09/23/2022: The wound remains about the same size. The skin edges are looking rather senescent. 09/29/2022: The aggressive debridement I performed last week seems to have been effective. The wound is smaller and has significantly less slough accumulation. 10/06/2022: There has been quite a bit of epithelialization since last week. There are still some open areas with slough accumulation. There is some callus buildup around the perimeter. 10/13/2022: Continued improvement. Minimal callus accumulation. 3/14; patient presents for follow-up. He has been using endoform to the wound bed without issues. He is using a surgical shoe with peg assist for offloading. 10/27/2022: The wound dimensions are stable. There is some senescent skin accumulation around the perimeter. 11/04/2022: Last week I performed a very aggressive debridement in an effort to stimulate the healing cascade. As has been the case when I have done this before, the wound has responded well. There has been epithelialization and contraction of the wound. There are just a couple of small open areas. 11/10/2022: Unfortunately, his foot got wet secondary to sweating and there has been some breakdown of the tissue, particularly the skin just adjacent to the main ulcer. 11/18/2022: We continue to struggle with moisture-related tissue breakdown around the perimeter of his wound. This has caused the thin epithelium that had formed on the surface to disintegrate. The underlying surface of the wound has good granulation tissue. [name], [name] ([id number]) [id number] Page 6 of 17 11/25/2022: The edges of the wound are much less macerated, but the surface is actually looking a little bit dry. There is  slough accumulation. No obvious signs of infection. 12/01/2022: The wound edges are more macerated and broken down, making the wound larger. The surface has some slough on it. 12/08/2020: The wound looks better this week. There is significantly less maceration and moisture-related tissue breakdown. There is some slough on the wound surface. 12/15/2022: The wound continues to improve. There is almost no moisture-related tissue breakdown. There is epithelium beginning to fill in again from the edges. Light slough on the wound surface. 12/22/2022: More epithelium has filled him from around the edges. There is some slough on the surface. No evidence of moisture-related tissue breakdown. 01/05/2023: There has been more epithelialization, particularly at the posterior calcaneal aspect  of the wound. There has been a little bit of moisture accumulation with minimal maceration. 01/12/2023: More epithelium has filled in. There is very minimal accumulation of slough. 01/20/2023: The wound is measuring a little bit smaller today. It did measure deeper, but this appears to be secondary to some heaped up senescent skin around the edges. The epithelium that has been present at the posterior aspect of the wound seems to be a bit more robust with greater integrity. 01/26/2023: He reports intense itching to the skin around the wound and there has been some breakdown here. It looks fungal in nature. The wound itself looks pretty good with improved epithelialization with just some slough buildup. He has a little callus along the wound edges. 02/02/2023: He has been treating the periwound skin with an over-the-counter athlete's foot medication and notes significant improvement in his pruritus. The skin looks better here, as well. The wound continues to epithelialize. There is light slough accumulation. 02/10/2023: The periwound skin still looks fairly angry. He reports multiple small blisters that have opened up. He is not having the  intense itching that he had prior, however. There has been more epithelialization at the calcaneus and there is minimal slough accumulation. 02/16/2023: The periwound skin is improving. An online review of dermatology rash images suggest that this may be hand-foot-and-mouth disease, isolated to the feet; the patient says he actually had this as a child. The calcaneus continues to epithelialize and the tissue was quite robust here. Very minimal slough accumulation on the open portion of the wound with a little bit of periwound callus buildup. 03/03/2023: The wound measurements are about the same, but there is more epithelium making its way to the surface. There is a little bit of crust and eschar around the edges and minimal slough on the surface. 03/09/2023: His dressing was applied rather haphazardly and the drawtex was not applied. The Optifoam was also reversed such that the orange side was in contact with his bare skin. He has some maceration around the wound edges, but this did not appear to result in any tissue breakdown. There is a little bit of slough on the surface and minimal eschar around the edges. 03/15/2023: The wound measured narrower today. There is some slough accumulation on the surface and some eschar around the edges. 03/23/2023: The length of the wound remains stable, but it continues to narrow. The skin that has covered the posterior aspect of the calcaneus is more robust and has better tensile integrity. Minimal slough and eschar accumulation. 03/30/2023: The wound length has decreased. This seems to be primarily due to more epithelium covering the posterior aspect of the site. Minimal slough and eschar buildup. 04/07/2023: The wound measured a little bit smaller again today. There is minimal slough accumulation with just a little bit of eschar and callus around the edges. 04/13/2023: The lateral aspect of his wound is starting to epithelialize. There is very minimal slough on the  surface. Moisture control is good. Electronic Signature(s) Signed: 04/13/2023 10:04:40 AM By: [name] MD FACS Entered By: [name] on 04/13/2023 07:04:40 -------------------------------------------------------------------------------- Physical Exam Details Patient Name: Date of Service: [name], [name] 04/13/2023 9:30 A M Medical Record Number: [id number] Patient Account Number: [id number] Date of Birth/Sex: Treating RN: Oct 25, 1975 (47 y.o. M) Primary Care Provider: Dorinda Hill Other Clinician: Referring Provider: Treating Provider/Extender: Jana Hakim in Treatment: 143 Constitutional . . . . no acute distress. Respiratory Normal work of breathing on room air. Notes 04/13/2023:  The lateral aspect of his wound is starting to epithelialize. There is very minimal slough on the surface. Moisture control is good. CLARION, TRASK ([id number]) [id number] Page 7 of 17 Electronic Signature(s) Signed: 04/13/2023 10:05:21 AM By: [name] MD FACS Entered By: [name] on 04/13/2023 07:05:21 -------------------------------------------------------------------------------- Physician Orders Details Patient Name: Date of Service: [name], [name] 04/13/2023 9:30 A M Medical Record Number: [id number] Patient Account Number: [id number] Date of Birth/Sex: Treating RN: September 02, 1975 (47 y.o. [name] Primary Care Provider: Dorinda Hill Other Clinician: Referring Provider: Treating Provider/Extender: Jana Hakim in Treatment: [phone] Verbal / Phone Orders: No Diagnosis Coding ICD-10 Coding Code Description [phone] Non-pressure chronic ulcer of other part of left foot with fat layer exposed Follow-up Appointments ppointment in 1 week. - Dr. Lady Gary Room 3 Return A Anesthetic Wound #2 Left Calcaneus (In clinic) Topical Lidocaine 4% applied to wound bed - In clinic Bathing/ Shower/ Hygiene May  shower and wash wound with soap and water. Edema Control - Lymphedema / SCD / Other Bilateral Lower Extremities Avoid standing for long periods of time. Moisturize legs daily. - as needed Off-Loading Other: - keep pressure off of the bottom of your feet. Elevate legs throughout the day. Use the Shoe with the PegAssist off-loading insole Additional Orders / Instructions Follow Nutritious Diet - Try to get 70-100 grams of Protein a day+ Wound Treatment Wound #2 - Calcaneus Wound Laterality: Left Cleanser: Normal Saline (Generic) 1 x Per Day/30 Days Discharge Instructions: Cleanse the wound with Normal Saline prior to applying a clean dressing using gauze sponges, not tissue or cotton balls. Cleanser: Wound Cleanser (Generic) 1 x Per Day/30 Days Discharge Instructions: Cleanse the wound with wound cleanser prior to applying a clean dressing using gauze sponges, not tissue or cotton balls. Peri-Wound Care: Ketoconazole Cream 2% 1 x Per Day/30 Days Discharge Instructions: Apply Ketoconazole mixed with zinc to the periwound Peri-Wound Care: Zinc Oxide Ointment 30g tube 1 x Per Day/30 Days Discharge Instructions: Apply Zinc Oxide to periwound with each dressing change Topical: Gentamicin (Generic) 1 x Per Day/30 Days Discharge Instructions: apply to the wound bed Prim Dressing: Maxorb Extra Ag+ Alginate Dressing, 4x4.75 (in/in) (Generic) 1 x Per Day/30 Days ary Discharge Instructions: Apply to wound bed as instructed Secondary Dressing: Drawtex 4x4 in (Generic) 1 x Per Day/30 Days Discharge Instructions: Apply over primary dressing as directed. Secondary Dressing: Optifoam Non-Adhesive Dressing, 4x4 in (Generic) 1 x Per Day/30 Days Discharge Instructions: Apply over primary dressing as directed. [name], [name] ([id number]) [id number] Page 8 of 17 Secondary Dressing: Woven Gauze Sponge, Non-Sterile 4x4 in (Generic) 1 x Per Day/30 Days Discharge Instructions: Apply  over primary dressing as directed. Secured With: 62M Medipore H Soft Cloth Surgical T ape, 4 x 10 (in/yd) (Generic) 1 x Per Day/30 Days Discharge Instructions: Secure with tape as directed. Compression Wrap: Kerlix Roll 4.5x3.1 (in/yd) (Generic) 1 x Per Day/30 Days Discharge Instructions: Apply Kerlix and Coban compression as directed. Add-Ons: [employer] Offloading Boot, Size Regular 1 x Per Day/30 Days Electronic Signature(s) Signed: 04/13/2023 10:28:43 AM By: [name] MD FACS Entered By: [name] on 04/13/2023 07:05:35 -------------------------------------------------------------------------------- Problem List Details Patient Name: Date of Service: [name], [name] 04/13/2023 9:30 A M Medical Record Number: [id number] Patient Account Number: [id number] Date of Birth/Sex: Treating RN: 05/09/1976 (47 y.o. [name] Primary Care Provider: Dorinda Hill Other Clinician: Referring Provider: Treating Provider/Extender: Jana Hakim in Treatment: [phone] Active Problems  ICD-10 Encounter Code Description Active Date MDM Diagnosis L97.522 Non-pressure chronic ulcer of other part of left foot with fat layer exposed 09/03/2020 No Yes Inactive Problems ICD-10 Code Description Active Date Inactive Date L97.512 Non-pressure chronic ulcer of other part of right foot with fat layer exposed 09/03/2020 09/03/2020 L89.893 Pressure ulcer of other site, stage 3 07/15/2020 07/15/2020 M62.81 Muscle weakness (generalized) 07/15/2020 07/15/2020 I10 Essential (primary) hypertension 07/15/2020 07/15/2020 M86.171 Other acute osteomyelitis, right ankle and foot 09/03/2020 09/03/2020 Resolved Problems Electronic Signature(s) Signed: 04/13/2023 10:02:33 AM By: [name] MD FACS Entered By: [name] on 04/13/2023 07:02:33 [name] ([id number]) [id number] Page 9 of  17 -------------------------------------------------------------------------------- Progress Note Details Patient Name: Date of Service: [name], TO [name] 04/13/2023 9:30 A M Medical Record Number: [id number] Patient Account Number: [id number] Date of Birth/Sex: Treating RN: [birth year] (47 y.o. M) Primary Care Provider: Dorinda Hill Other Clinician: Referring Provider: Treating Provider/Extender: Jana Hakim in Treatment: 143 Subjective Chief Complaint Information obtained from Patient Bilateral Plantar Foot Ulcers History of Present Illness (HPI) Wounds are12/03/2020 upon evaluation today patient presents for initial inspection here in our clinic concerning issues he has been having with the bottoms of his feet bilaterally. He states these actually occurred as wounds when he was hospitalized for 5 months secondary to Covid. He was apparently with tilting bed where he was in an upright position quite frequently and apparently this occurred in some way shape or form during that time. Fortunately there is no sign of active infection at this time. No fevers, chills, nausea, vomiting, or diarrhea. With that being said he still has substantial wounds on the plantar aspects of his feet Theragen require quite a bit of work to get these to heal. He has been using Santyl currently though that is been problematic both in receiving the medication as well as actually paid for it as it is become quite expensive. Prior to the experience with Covid the patient really did not have any major medical problems other than hypertension he does have some mild generalized weakness following the Covid experience. 07/22/2020 on evaluation today patient appears to be doing okay in regard to his foot ulcers I feel like the wound beds are showing signs of better improvement that I do believe the Iodoflex is helping in this regard. With that being said he does have a lot of drainage currently and this  is somewhat blue/green in nature which is consistent with Pseudomonas. I do think a culture today would be appropriate for [place] to evaluate and see if that is indeed the case I would likely start him on antibiotic orally as well he is not allergic to Cipro knows of no issues he has had in the past 12/21; patient was admitted to the clinic earlier this month with bilateral presumed pressure ulcers on the bottom of his feet apparently related to excessive pressure from a tilt table arrangement in the intensive care unit. Patient relates this to being on ECMO but I am not really sure that is exactly related to that. I must say I have never seen anything like this. He has fairly extensive full-thickness wounds extending from his heel towards his midfoot mostly centered laterally. There is already been some healing distally. He does not appear to have an arterial issue. He has been using gentamicin to the wound surfaces with Iodoflex to help with ongoing debridement 1/6; this is a patient with pressure ulcers on the bottom of his feet related to excessive  pressure from a standing position in the intensive care unit. He is complaining of a lot of pain in the right heel. He is not a diabetic. 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 We have been using Iodoflex to help prepare the surfaces of both wounds for an advanced treatment product. He is nonambulatory spending most of his time in a wheelchair I have asked him not to propel the wheelchair with his heels 1/13; in general his wounds look better not much surface area change we have been using Iodoflex as of last week. I did an x-ray of the right heel as the patient was complaining of pain. I had some thoughts about a stress fracture perhaps Achilles tendon problems however what it showed was erosive changes along the inferior aspect of the calcaneus he now has a MRI booked for 1/20. 1/20; in general his wounds continue to be better.  Some improvement in the large narrow areas proximally in his foot. He is still complaining of pain in the right heel and tenderness in certain areas of this wound. His MRI is tonight. I am not just looking for osteomyelitis that was brought up on the x-ray I am wondering about stress fractures, tendon ruptures etc. He has no such findings on the left. Also noteworthy is that the patient had critical illness neuropathy and some of the discomfort may be actual improvement in nerve function I am just not sure. These wounds were initially in the setting of severe critical illness related to COVID-19. He was put in a standing position. He may have also been on pressors at the point contributing to tissue ischemia. By his description at some point these wounds were grossly necrotic extending proximally up into the Achilles part of his heel. I do not know that I have ever really seen pictures of them like this although they may exist in epic We have ordered Tri layer Oasis. I am trying to stimulate some granulation in these areas. This is of course assuming the MRI is negative for infection 1/27; since the patient was last here he saw Dr. Earlene Plater of infectious disease. He is planned for vancomycin and ceftriaxone. Prior operative culture grew MSSA. Also ordered baseline lab work. He also ordered arterial studies although the ABIs in our clinic were normal as well as his clinical exam these were normal I do not think he needs to see vascular surgery. His ABIs at the [facility] were 1.22 in the right triphasic waveforms with a normal TBI of 1.15 on the left ABI of 1.22 with triphasic waveforms and a normal TBI of 1.08. Finally he saw Dr. Logan Bores who will follow him in for 2 months. At this point I do not think he felt that he needed a procedure on the right calcaneal bone. Dr. Earlene Plater is elected for broad-spectrum antibiotic The patient is still having pain in the right heel. He walks with a walker 2/3; wounds are  generally smaller. He is tolerating his IV antibiotics. I believe this is vancomycin and ceftriaxone. We are still waiting for Oasis burn in terms of his out-of-pocket max which he should be meeting soon given the IV antibiotics, MRIs etc. I have asked him to check in on this. We are using silver collagen in the meantime the wounds look better 2/10; tolerating IV vancomycin and Rocephin. We are waiting to apply for Oasis. Although I am not really sure where he is in his out-of-pocket max. 2/17 started the first application of  Oasis Administrator, sports. Still on antibiotics. The wounds have generally look better. The area on the left has a little more surface slough requiring debridement 2/24; second application of Oasis trilayer. The wound surface granulation is generally look better. The area on the left with undermining laterally I think is come in a bit. 10/08/2020 upon evaluation today patient is here today for [employer] application #3. Fortunately he seems to be doing extremely well with regard to this and we are seeing a lot of new epithelial growth which is great news. Fortunately there is no signs of active infection at this time. 10/16/2020 upon evaluation today patient appears to be doing well with regard to his foot ulcers. Do believe the Oasis has been of benefit for him. I do not see any signs of infection right now which is great news and I think that he has a lot of new epithelial growth which is great to see as well. The patient is very pleased to hear all of this. I do think we can proceed with the Oasis trilayer #4 today. 3/18; not as much improvement in these areas on his heels that I was hoping. I did reapply trilateral Oasis today the tissue looks healthier but not as much fill [name], [name] ([id number]) [id number] Page 10 of 17 in as I was hoping. 3/25; better looking today I think this is come in a bit the tissue looks healthier. Triple layer Oasis  reapplied #6 4/1; somewhat better looking definitely better looking surface not as much change in surface area as I was hoping. He may be spending more time [place] on days then he needs to although he does have heel offloading boots. Triple layer Oasis reapplied #7 4/7; unfortunately apparently [facility] will not approve any further Oasis which is unfortunate since the patient did respond nicely both in terms of the condition of the wound bed as well as surface area. There is still some drainage coming from the wound but not a lot there does not appear to be any infection 4/15; we have been using Hydrofera Blue. He continues to have nice rims of epithelialization on the right greater than the left. The left the epithelialization is coming from the tip of his heel. There is moderate drainage. In this that concerns me about a total contact cast. There is no evidence of infection 4/29; patient has been using Hydrofera Blue with dressing changes. He has no complaints or issues today. 5/5; using Hydrofera Blue. I actually think that he looks marginally better than the last time I saw this 3 weeks ago. There are rims of epithelialization on the left thumb coming from the medial side on the right. Using Hydrofera Blue 5/12; using Hydrofera Blue. These continue to make improvements in surface area. His drainage was not listed as severe I therefore went ahead and put a cast on the left foot. Right foot we will continue to dress his previous 5/16; back for first total contact cast change. He did not tolerate this particularly well cast injury on the anterior tibia among other issues. Difficulty sleeping. I talked him about this in some detail and afterwards is elected to continue. I told him I would like to have a cast on for 3 weeks to see if this is going to help at all. I think he agreed 5/19; I think the wound is better. There is no tunneling towards his midfoot. The undermining medially also  looks better. He has a rim of new  skin distally. I think we are making progress here. The area on the left also continues to look somewhat better to me using Hydrofera Blue. He has a list of complaints about the cast but none of them seem serious 5/26; patient presents for 1 week follow-up. He has been using a total contact cast and tolerating this well. Hydrofera Blue is the main dressing used. He denies signs of infection. 6/2 Hydrofera Blue total contact cast on the left. These were large ulcers that formed in intensive care unit where the patient was recovering from COVID. May have had something to do with being ventilated in an upright positiono Pressors etc. We have been able to get the areas down considerably and a viable surface. There is some epithelialization in both sides. Note made of drainage 6/9; changed to [facility] last time because of drainage. He arrives with better looking surfaces and dimensions on the left than the right. Paradoxically the right actually probes more towards his midfoot the left is largely close down but both of these look improved. Using a total contact cast on the left 6/16; complex wounds on his bilateral plantar heels which were initially pressure injury from a stay in the ICU with COVID. We have been using silver alginate most recently. His dimensions of come in quite dramatically however not recently. We have been putting the left foot in a total contact cast 6/23; complex wounds on the bilateral plantar heels. I been putting the left in the cast paradoxically the area on the right is the one that is going towards closure at a faster rate. Quite a bit of drainage on the left. The patient went to see Dr. Logan Bores who said he was going to standby for skin grafts. I had actually considered sending him for skin grafts however he would be mandatorily off his feet for a period of weeks to months. I am thinking that the area on the right is going to close on its  own the area on the left has been more stubborn even though we have him in a total contact cast 6/30; took him out of a total contact cast last week is the right heel seem to be making better progress than the left where I was placing the cast. We are using silver alginate. Both wounds are smaller right greater than left 7/12; both wounds look as though they are making some progress. We are using silver alginate. Heel offloading boots 7/26; very gradual progress especially on the right. Using silver alginate. He is wearing heel offloading boots 8/18; he continues to close these wounds down very gradually. Using silver alginate. The problem polymen being definitive about this is areas of what appears to be callus around the margins. This is not a 100% of the area but certainly sizable especially on the right 9/1; bilateral plantar feet wounds secondary to prolonged pressure while being ventilated for COVID-19 in an upright position. Essentially pressure ulcers on the bottom of his feet. He is made substantial progress using silver alginate. 9/14; bilateral plantar feet wounds secondary to prolonged pressure. Making progress using silver alginate. 9/29 bilateral plantar feet wounds secondary to prolonged pressure. I changed him to Iodoflex last week. MolecuLight showing reddened blush fluorescence 10/11; patient presents for follow-up. He has no issues or complaints today. He denies signs of infection. He continues to use Iodoflex and antibiotic ointment to the wound beds. 10/27; 2-week follow-up. No evidence of infection. He has callus and thick dry skin around the  wound margins we have been using Iodoflex and Bactroban which was in response to a moderate left MolecuLight reddish blush fluorescence. 11/10; 2-week follow-up. Wound margins again have thick callus however the measurements of the actual wound sites are a lot smaller. Everything looks reasonably healthy here. We have been using  Iodoflex He was approved for prime matrix but I have elected to delay this given the improvement in the surface area. Hopefully I will not regret that decision as were getting close to the end of the year in terms of insurance payment 12/8; 2-week follow-up. Wounds are generally smaller in size. These were initially substantial wounds extending into the forefoot all the way into the heel on the bilateral plantar feet. They are now both located on the plantar heel distal aspect both of these have a lot of callus around the wounds I used a #5 curette to remove this on the right and the left also some subcutaneous debris to try and get the wound edges were using Iodoflex. He has heel offloading shoe 12/22; 2-week follow-up. Not really much improvement. He has thick callus around the outer edges of both wounds. I remove this there is some nonviable subcutaneous tissue as well. We have been using Iodoflex. Her intake nurse and myself spontaneously thought of a total contact cast I went back in May. At that time we really were not seeing much of an improvement with a cast although the wound was in a much different situation I would like to retry this in 2 weeks and I discussed this with the patient 08/12/2021; the patient has had some improvement with the Iodoflex. The the area on the left heel plantar more improved than the right. I had to put him in a total contact cast on the left although I decided to put that off for 2 weeks. I am going to change his primary dressing to silver collagen. I think in both areas he has had some improvement most of the healing seems to be more proximal in the heel. The wounds are in the mid aspect. A lot of thick callus on the right heel however. 1/19; we are using silver collagen on both plantar heel areas. He has had some improvement today. The left did not require any debridement. He still had some eschar on the right that was debrided but both seem to have contracted. I  did not put it total contact cast on him today 2/2 we have been using silver collagen. The area on the right plantar heel has areas that appear to be epithelialized interspersed with dry flaking callus and dry skin. I removed this. This really looks better than on the other side. On the left still a large area with raised edges and debris on the surface. 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 He has no issues or complaints today. 2/9; not much change in the left foot wound with 1 week of a cast we are using silver collagen. Silver collagen on the right side. The right side has been the better wound surface. We will reapply the total contact cast on the left 2/16; not much improvement on either side I been using silver collagen with a total contact cast on the left. I'm changing the Hydrofera Blue still with a total contact cast on the left 2/23; some improvement on both sides. Disappointing that  he has thick callus around the area that we are putting in a total contact cast on the left. We've been using Hydrofera Blue on both wound areas. This is a man who at essentially pressure ulcers in addition to ischemia caused by medications to support his blood pressure (pressors) in the ICU. He was being ventilated in the standing position for severe Covid. A Shiley the wounds extended across his entire foot but are now localized to his plantar heels bilaterally. We have made progress however neither areas healed. I continue to think the total contact cast is helped albeit painstakingly slowly. He has never wanted a plastic surgery consult although I don't know that they would be interested in grafting in area in this location. 10/07/2021: Continued improvement bilaterally. There is still some callus around the left wound, despite the total contact cast. He has some increased  pain in his right midfoot around 1 particular area. This has been painful in the past but seems to be a little bit worse. When his cast was removed today, there was an area on the heel of the left foot that looks a bit macerated. He is also complaining of pain in his left thigh and hip which he thinks is secondary to the limb length [name], [name] ([id number]) [phone].pdf Page 11 of 17 discrepancy caused by the cast. 10/14/2021: He continues to improve. A little bit less callus around the left wound. He continues to endorse pain in his right midfoot, but this is not as significant as it was last week. The maceration on his left heel is improved. 10/21/2021: Continued improvement to both wounds. The maceration on his left heel is no longer evident. Less callus bilaterally. Epithelialization progressing. 10/28/2021: Significant improvement this week. The right sided wound is nearly closed with just a small open area at the middle. No maceration seen on the left heel. Continued epithelialization on both sides. No concern for infection. 11/04/2021: T oday, the wounds were measured a little bit differently and come out as larger, but I actually think they are about the same to potentially even smaller, particularly on the left. He continues to accumulate some callus on the right. 11/11/2021: T oday, the patient is expressing some concern that the left wound, despite being in the total contact cast, is not progressing at the same rate as the 1 on the right. He is interested in trying a week without the cast to see how the wound does. The wounds are roughly the same size as last week, with the right perhaps being a little bit smaller. He continues to build up callus on both sites. 11/18/2021: Last week, I permitted the patient to go without his total contact cast, just to see if the cast was really making any difference. Today, both wounds have deteriorated to some extent, suggesting  that the cast is providing benefit, at least on the left. Both are larger and have accumulated callus, slough, and other debris. 11/26/2021: I debrided both wounds quite aggressively last week in an effort to stimulate the healing cascade. This appears to have been effective as the left sided wound is a full centimeter shorter in length. Although the right was measured slightly larger, on inspection, it looks as though an area of epithelialized tissue was included in the measurements. We have been using PolyMem Ag on the wound surfaces with a total contact cast to the left. 12/02/2021: It appears that the intake personnel are including epithelialized tissue in his wound measurements; the right  wound is almost completely epithelialized; there is just a crater at the proximal midfoot with some open areas. On the left, he has built up some callus, but the overall wound surface looks good. There is some senescent skin around the wound margin. He has been in PolyMem Ag bilaterally with a total contact cast on the left. 12/09/2021: The right wound is nearly closed; there is just a small open area at the mid calcaneus. On the left, the wound is smaller with minimal callus buildup. No significant drainage. 12/16/2021: The right calcaneal wound remains minimally open at the mid calcaneus; the rest has epithelialized. On the left, the wound is also a little bit smaller. There is some senescent tissue on the periphery. He is getting his first application of a trial skin substitute called Vendaje today. 12/23/2021: The wound on his right calcaneus is nearly closed; there is just a small area at the most distal aspect of the calcaneus that is open. On the left, the area where we applied to the skin substitute has a healthier-looking bed of granulation tissue. The wound dimensions are not significantly different on this side but the wound surface is improved. 12/30/2021: The wound on the right calcaneus has not changed  significantly aside from some accumulation of callus. On the left, the open area is smaller and continues to have an improved surface. He continues to accumulate callus around the wound. He is here for his third application of Vendaje. 01/06/2022: The right calcaneal wound is down to just a couple of millimeters. He continues to accumulate periwound callus. He unfortunately got his cast wet earlier in the week and his left foot is macerated, resulting in some superficial skin loss just distal to the open wound. The open wound itself, however, is much smaller and has a healthier appearing surface. He is here for his fourth application of Vendaje. 01/13/2022: The right calcaneal wound is about the same. Unfortunately, once again, his cast got wet and his foot is again macerated. This is caused the left calcaneal wound to enlarge. He is here for his fifth application of Vendaje. 01/20/2022: The right calcaneal wound is very small. There is some periwound callus accumulation. He purchased a new cast protector last week and this has been effective in avoiding the maceration that has been occurring on the left. The left calcaneal wound is narrower and has a healthy and viable-appearing surface. He is here for his 6 application of Vendaje. 01/27/2022: The right calcaneal wound is down to just a pinhole. There is some periwound slough and callus. On the left, the wound is narrower and shorter by about a centimeter. The surface is robust and viable-appearing. Unfortunately, the rep for the trial skin substitute product did not provide any for [place] to use today. 02/04/2022: The right calcaneal wound remains unchanged. There is more accumulated callus. On the left, although the intake nurse measured it a little bit longer, it looks about the same to me. The surface has a layer of slough, but underneath this, there is good granulation tissue. 02/10/2022: The right calcaneus wound is nearly closed. There is still some callus  that builds up around the site. The left side looks about the same in terms of dimensions, but the surface is more robust and vital-appearing. 02/16/2022: The area of the right calcaneus that was nearly closed last week has closed, but there is a small opening at the mid foot where it looks like some moisture got retained and caused some reopening. The  left foot wound is narrower and shallower. Both sites have a fair amount of periwound callus and eschar. 02/24/2022: The small midfoot opening on the right calcaneus is a little bit smaller today. The left foot wound is narrower and shallower. He continues to accumulate periwound callus. No concern for infection. 03/01/2022: The patient came to clinic early because he showered and got his cast wet. Fortunately, there is no significant maceration to his foot but the callus softened and it looks like the wound on his left calcaneus may be a little bit wider. The wound on his right calcaneus is just a narrow slit. Continued accumulation of periwound callus bilaterally. 03/08/2022: The wound on his right calcaneus is very nearly closed, just a small pinpoint opening under a bit of eschar; the left wound has come in quite a bit, as well. It is narrower and shorter than at our last visit. Still with accumulated callus and eschar bilaterally. 03/17/2022: The right calcaneal wound is healed. The left wound is smaller and the surface itself is very clean, but there is some blue-green staining on the periwound callus, concerning for Pseudomonas aeruginosa. 03/23/2022: The right calcaneal wound remains closed. The left wound continues to contract. No further blue-green staining. Small amount of callus and slough accumulation. 03/28/2022: He came in early today because he had gotten his cast wet. On inspection, the wound itself did not get wet or macerated, just a little bit of the forefoot. The wound itself is basically unchanged. 04/07/2022: The right foot wound remains  closed. The left wound is the smallest that I have seen it to date. It is narrower and shorter. It still continues to accumulate slough on the surface. 04/15/2022: There is a band of epithelium now dividing the small left plantar foot wound in 2. There is still some slough on the surface. 04/21/2022: The wound continues to narrow. Just a little bit of slough on the surface. He seems to be responding well to endoform. [name], [name] ([id number]) [id number] Page 12 of 17 04/28/2022: Continued slow contraction of the wound. There is a little slough on the surface and some periwound callus. We have been using endoform and total contact cast. 05/05/2022: The wound appears to have stalled. There is slough and some periwound eschar/callus. No concern for infection, however. 05/12/2022: Unfortunately, his right foot has reopened. It is located at the most posterior aspect of his surgical incision. The area was noted to have drainage coming from it when his padding was removed today. Underneath some callus and senescent skin, there is an opening. No purulent drainage or malodor. On the left foot, the wound is again unchanged. There is some light blue staining on the callus, but no malodor or purulent drainage. 10/13; right and left heel remanence of extensive plantar foot wounds. These are better than I remember by quite a big margin however he is still left with wounds on the left plantar heel and the right plantar heel. Been using endoform bilaterally. A culture was done that showed apparently Pseudomonas but we are still waiting for the [facility] antibiotic to use gentamicin today. He is still very active by description I am not sure about the offloading of his noncasted right foot 10/20; both wounds right and left heel debrided not much change from last week. [name] Echevaria has arrived which is linezolid, gentamicin and ciprofloxacin we will use this with endoform. T contact cast on the  left otal 06/02/2022: Both wounds are smaller today. There is still a fair amount  of callus buildup around the right foot ulcer. The left is more superficial and nearly flush with the surrounding tissues. Also with slough and eschar buildup. 06/10/2022: The right sided wound appears to be nearly closed, if not completely so, although it is somewhat difficult to tell given the abnormal tissue and scarring in his foot. There is a fair amount of callus and crust accumulation. On the left, the wound looks about the same, again with callus and slough. He has an appointment next Thursday with Dr. Annamary Rummage in podiatry; I am hopeful that there may be some reconstructive options available for Mr. Upadhyay. 06/16/2022: Both wounds have some eschar and callus accumulation. The right sided wound is extremely narrow and barely open; the left is narrower than last week. There is a little bit of slough. He has his appointment in podiatry later today. 06/23/2022: The patient met with Dr. Annamary Rummage last week and unfortunately, there are no reconstructive options that he believes would be helpful. He did order an MRI to evaluate for osteomyelitis and fortunately, none was seen. The left sided wound is a little bit shorter and narrower today. The right sided wound is about the same. There is callus and eschar accumulation bilaterally. 06/29/2022: Both feet have improved from last week. There is epithelium making a valiant effort to creep across the surface on the left. The right side looks like it got a little dry and the deep crevasse in his midfoot has cracked. Both have eschar and there is some slough on the left. 07/07/2022: Both feet have improved. There is epithelium completely covering the calcaneus on the right with just a small opening in the crevasse in his midfoot. On the left, the open area of tissue is smaller but he continues to build up callus/eschar and slough. 07/15/2022: The opening in the midfoot on  the right is about the same size, covered with eschar and a little bit of slough. The open portion of the left wound is narrower and shorter with a bit of slough buildup. He admits to being on his feet more than recommended. 12/14; as far as I can tell everything on the right foot is closed. There is some eschar I removed some of this I cannot identify any open wound here. As usual this will be a very vulnerable area going forward. On the left this looks really quite healthy. I was pleasantly surprised to see how good this looked. Wound is certainly smaller and there appears to be healthy epithelialization. He has been using Promogran on the right and endoform on the left. He has been offloading the right foot with a heel offloading boot and he has a running shoe on the right foot 08/04/2022: The right foot remains closed. He has a thick cushioned insole in his sneaker. The left sided wound is smaller with just some slough and eschar accumulation. He is wearing the heel off loader on this foot. 08/15/2022: The right foot remains closed. The left wound has narrowed further. There is some slough and eschar accumulation. 08/25/2022: We put him in a peg assist shoe insert and as a result, he has more epithelialization of the ulcer with minimal slough and eschar accumulation. 09/01/2022: The wound is smaller by about half this week. Still with some slough on the surface. The peg assist shoe seems to be doing a remarkable job of adequately offloading the site. 09/08/2022: There is a little bit more epithelium coming in. There is some slough and callus buildup. 09/16/2022: The wound  measures about the same size, but the epithelium that has grown in looks more robust and stronger. There is some slough and callus buildup. 09/23/2022: The wound remains about the same size. The skin edges are looking rather senescent. 09/29/2022: The aggressive debridement I performed last week seems to have been effective. The wound is  smaller and has significantly less slough accumulation. 10/06/2022: There has been quite a bit of epithelialization since last week. There are still some open areas with slough accumulation. There is some callus buildup around the perimeter. 10/13/2022: Continued improvement. Minimal callus accumulation. 3/14; patient presents for follow-up. He has been using endoform to the wound bed without issues. He is using a surgical shoe with peg assist for offloading. 10/27/2022: The wound dimensions are stable. There is some senescent skin accumulation around the perimeter. 11/04/2022: Last week I performed a very aggressive debridement in an effort to stimulate the healing cascade. As has been the case when I have done this before, the wound has responded well. There has been epithelialization and contraction of the wound. There are just a couple of small open areas. 11/10/2022: Unfortunately, his foot got wet secondary to sweating and there has been some breakdown of the tissue, particularly the skin just adjacent to the main ulcer. 11/18/2022: We continue to struggle with moisture-related tissue breakdown around the perimeter of his wound. This has caused the thin epithelium that had formed on the surface to disintegrate. The underlying surface of the wound has good granulation tissue. 11/25/2022: The edges of the wound are much less macerated, but the surface is actually looking a little bit dry. There is slough accumulation. No obvious signs of infection. 12/01/2022: The wound edges are more macerated and broken down, making the wound larger. The surface has some slough on it. 12/08/2020: The wound looks better this week. There is significantly less maceration and moisture-related tissue breakdown. There is some slough on the wound surface. [name], [name] ([id number]) [id number] Page 13 of 17 12/15/2022: The wound continues to improve. There is almost no moisture-related tissue  breakdown. There is epithelium beginning to fill in again from the edges. Light slough on the wound surface. 12/22/2022: More epithelium has filled him from around the edges. There is some slough on the surface. No evidence of moisture-related tissue breakdown. 01/05/2023: There has been more epithelialization, particularly at the posterior calcaneal aspect of the wound. There has been a little bit of moisture accumulation with minimal maceration. 01/12/2023: More epithelium has filled in. There is very minimal accumulation of slough. 01/20/2023: The wound is measuring a little bit smaller today. It did measure deeper, but this appears to be secondary to some heaped up senescent skin around the edges. The epithelium that has been present at the posterior aspect of the wound seems to be a bit more robust with greater integrity. 01/26/2023: He reports intense itching to the skin around the wound and there has been some breakdown here. It looks fungal in nature. The wound itself looks pretty good with improved epithelialization with just some slough buildup. He has a little callus along the wound edges. 02/02/2023: He has been treating the periwound skin with an over-the-counter athlete's foot medication and notes significant improvement in his pruritus. The skin looks better here, as well. The wound continues to epithelialize. There is light slough accumulation. 02/10/2023: The periwound skin still looks fairly angry. He reports multiple small blisters that have opened up. He is not having the intense itching that he had prior, however. There  has been more epithelialization at the calcaneus and there is minimal slough accumulation. 02/16/2023: The periwound skin is improving. An online review of dermatology rash images suggest that this may be hand-foot-and-mouth disease, isolated to the feet; the patient says he actually had this as a child. The calcaneus continues to epithelialize and the tissue was quite  robust here. Very minimal slough accumulation on the open portion of the wound with a little bit of periwound callus buildup. 03/03/2023: The wound measurements are about the same, but there is more epithelium making its way to the surface. There is a little bit of crust and eschar around the edges and minimal slough on the surface. 03/09/2023: His dressing was applied rather haphazardly and the drawtex was not applied. The Optifoam was also reversed such that the orange side was in contact with his bare skin. He has some maceration around the wound edges, but this did not appear to result in any tissue breakdown. There is a little bit of slough on the surface and minimal eschar around the edges. 03/15/2023: The wound measured narrower today. There is some slough accumulation on the surface and some eschar around the edges. 03/23/2023: The length of the wound remains stable, but it continues to narrow. The skin that has covered the posterior aspect of the calcaneus is more robust and has better tensile integrity. Minimal slough and eschar accumulation. 03/30/2023: The wound length has decreased. This seems to be primarily due to more epithelium covering the posterior aspect of the site. Minimal slough and eschar buildup. 04/07/2023: The wound measured a little bit smaller again today. There is minimal slough accumulation with just a little bit of eschar and callus around the edges. 04/13/2023: The lateral aspect of his wound is starting to epithelialize. There is very minimal slough on the surface. Moisture control is good. Patient History Information obtained from Patient. Family History Cancer - Maternal Grandparents, Diabetes - Father,Paternal Grandparents, Heart Disease - Maternal Grandparents, Hypertension - Father,Paternal Grandparents, Lung Disease - Siblings, No family history of Hereditary Spherocytosis, Kidney Disease, Seizures, Stroke, Thyroid Problems, Tuberculosis. Social History Never  smoker, Marital Status - Married, Alcohol Use - Never, Drug Use - No History, Caffeine Use - Daily - tea, soda. Medical History Eyes Denies history of Cataracts, Glaucoma, Optic Neuritis Ear/Nose/Mouth/Throat Denies history of Chronic sinus problems/congestion, Middle ear problems Hematologic/Lymphatic Denies history of Anemia, Hemophilia, Human Immunodeficiency Virus, Lymphedema, Sickle Cell Disease Respiratory Patient has history of Asthma Denies history of Aspiration, Chronic Obstructive Pulmonary Disease (COPD), Pneumothorax, Sleep Apnea, Tuberculosis Cardiovascular Patient has history of Angina - with COVID, Hypertension Denies history of Arrhythmia, Congestive Heart Failure, Coronary Artery Disease, Deep Vein Thrombosis, Hypotension, Myocardial Infarction, Peripheral Arterial Disease, Peripheral Venous Disease, Phlebitis, Vasculitis Gastrointestinal Denies history of Cirrhosis , Colitis, Crohns, Hepatitis A, Hepatitis B, Hepatitis C Endocrine Denies history of Type I Diabetes, Type II Diabetes Genitourinary Denies history of End Stage Renal Disease Immunological Denies history of Lupus Erythematosus, Raynauds, Scleroderma Integumentary (Skin) Denies history of History of Burn Musculoskeletal Denies history of Gout, Rheumatoid Arthritis, Osteoarthritis, Osteomyelitis Neurologic Denies history of Dementia, Neuropathy, Quadriplegia, Paraplegia, Seizure Disorder Oncologic Denies history of Received Chemotherapy, Received Radiation Psychiatric Denies history of Anorexia/bulimia, Confinement Anxiety Hospitalization/Surgery History - COVID PNA 07/22/2019- 11/14/2019. - 03/27/2020 wound debridement/ skin graft. [name], LUTTRELL ([id number]) [id number] Page 14 of 17 Medical A Surgical History Notes nd Constitutional Symptoms (General Health) COVID PNA 07/22/2019-11/14/2019 VENT ECMO, foot drop left foot , Genitourinary kidney  stone Psychiatric anxiety Objective Constitutional  no acute distress. Vitals Time Taken: 9:34 AM, Height: 69 in, Weight: 280 lbs, BMI: 41.3, Temperature: 97.8 F, Pulse: 83 bpm, Respiratory Rate: 18 breaths/min, Blood Pressure: 135/82 mmHg. Respiratory Normal work of breathing on room air. General Notes: 04/13/2023: The lateral aspect of his wound is starting to epithelialize. There is very minimal slough on the surface. Moisture control is good. Integumentary (Hair, Skin) Wound #2 status is Open. Original cause of wound was Pressure Injury. The date acquired was: 10/07/2019. The wound has been in treatment 143 weeks. The wound is located on the Left Calcaneus. The wound measures 3.2cm length x 1.2cm width x 0.2cm depth; 3.016cm^2 area and 0.603cm^3 volume. There is Fat Layer (Subcutaneous Tissue) exposed. There is no tunneling or undermining noted. There is a medium amount of serosanguineous drainage noted. The wound margin is distinct with the outline attached to the wound base. There is large (67-100%) pink granulation within the wound bed. There is a small (1-33%) amount of necrotic tissue within the wound bed including Adherent Slough. The periwound skin appearance had no abnormalities noted for moisture. The periwound skin appearance exhibited: Scarring. The periwound skin appearance did not exhibit: Callus, Crepitus, Excoriation, Induration, Rash, Atrophie Blanche, Cyanosis, Ecchymosis, Hemosiderin Staining, Mottled, Pallor, Rubor, Erythema. Periwound temperature was noted as No Abnormality. The periwound has tenderness on palpation. Assessment Active Problems ICD-10 Non-pressure chronic ulcer of other part of left foot with fat layer exposed Procedures Wound #2 Pre-procedure diagnosis of Wound #2 is a Pressure Ulcer located on the Left Calcaneus . There was a Selective/Open Wound Non-Viable Tissue Debridement with a total area of 3.01 sq cm performed by [name], MD. With the  following instrument(s): Curette to remove Non-Viable tissue/material. Material removed includes Callus and Slough and after achieving pain control using Lidocaine 4% T opical Solution. No specimens were taken. A time out was conducted at 09:49, prior to the start of the procedure. A Minimum amount of bleeding was controlled with Pressure. The procedure was tolerated well. Post Debridement Measurements: 3.2cm length x 1.2cm width x 0.2cm depth; 0.603cm^3 volume. Post debridement Stage noted as Category/Stage III. Character of Wound/Ulcer Post Debridement is improved. Post procedure Diagnosis Wound #2: Same as Pre-Procedure General Notes: Scribed for Dr Lady Gary by Brenton Grills RN.Marland Kitchen Plan Follow-up Appointments: Return Appointment in 1 week. - Dr. Lady Gary Room 3 Anesthetic: Wound #2 Left Calcaneus: (In clinic) Topical Lidocaine 4% applied to wound bed - In clinic Bathing/ Shower/ Hygiene: May shower and wash wound with soap and water. Edema Control - Lymphedema / SCD / Other: Avoid standing for long periods of time. Moisturize legs daily. - as needed Off-LoadingEDDI, TATAR ([id number]) [id number] Page 15 of 17 Other: - keep pressure off of the bottom of your feet. Elevate legs throughout the day. Use the Shoe with the PegAssist off-loading insole Additional Orders / Instructions: Follow Nutritious Diet - Try to get 70-100 grams of Protein a day+ WOUND #2: - Calcaneus Wound Laterality: Left Cleanser: Normal Saline (Generic) 1 x Per Day/30 Days Discharge Instructions: Cleanse the wound with Normal Saline prior to applying a clean dressing using gauze sponges, not tissue or cotton balls. Cleanser: Wound Cleanser (Generic) 1 x Per Day/30 Days Discharge Instructions: Cleanse the wound with wound cleanser prior to applying a clean dressing using gauze sponges, not tissue or cotton balls. Peri-Wound Care: Ketoconazole Cream 2% 1 x Per Day/30 Days Discharge  Instructions: Apply Ketoconazole mixed with zinc to the periwound Peri-Wound Care: Zinc Oxide Ointment 30g tube 1  x Per Day/30 Days Discharge Instructions: Apply Zinc Oxide to periwound with each dressing change Topical: Gentamicin (Generic) 1 x Per Day/30 Days Discharge Instructions: apply to the wound bed Prim Dressing: Maxorb Extra Ag+ Alginate Dressing, 4x4.75 (in/in) (Generic) 1 x Per Day/30 Days ary Discharge Instructions: Apply to wound bed as instructed Secondary Dressing: Drawtex 4x4 in (Generic) 1 x Per Day/30 Days Discharge Instructions: Apply over primary dressing as directed. Secondary Dressing: Optifoam Non-Adhesive Dressing, 4x4 in (Generic) 1 x Per Day/30 Days Discharge Instructions: Apply over primary dressing as directed. Secondary Dressing: Woven Gauze Sponge, Non-Sterile 4x4 in (Generic) 1 x Per Day/30 Days Discharge Instructions: Apply over primary dressing as directed. Secured With: 65M Medipore H Soft Cloth Surgical T ape, 4 x 10 (in/yd) (Generic) 1 x Per Day/30 Days Discharge Instructions: Secure with tape as directed. Com pression Wrap: Kerlix Roll 4.5x3.1 (in/yd) (Generic) 1 x Per Day/30 Days Discharge Instructions: Apply Kerlix and Coban compression as directed. Add-Ons: [employer] Offloading Boot, Size Regular 1 x Per Day/30 Days 04/13/2023: The lateral aspect of his wound is starting to epithelialize. There is very minimal slough on the surface. Moisture control is good. I used a curette to debride very minimal slough and callus from the wound. He is doing well with the current regimen of topical gentamicin, silver alginate, drawtex, and an Optifoam donut in the peg assist offloading insert. We will continue this and he will follow-up in 1 week. Electronic Signature(s) Signed: 04/13/2023 10:06:38 AM By: [name] MD FACS Entered By: [name] on 04/13/2023  07:06:38 -------------------------------------------------------------------------------- HxROS Details Patient Name: Date of Service: [name], [name] 04/13/2023 9:30 A M Medical Record Number: [id number] Patient Account Number: [id number] Date of Birth/Sex: Treating RN: 10-Apr-1976 (47 y.o. M) Primary Care Provider: Dorinda Hill Other Clinician: Referring Provider: Treating Provider/Extender: Jana Hakim in Treatment: 143 Information Obtained From Patient Constitutional Symptoms (General Health) Medical History: Past Medical History Notes: COVID PNA 07/22/2019-11/14/2019 VENT ECMO, foot drop left foot , Eyes Medical History: Negative for: Cataracts; Glaucoma; Optic Neuritis Ear/Nose/Mouth/Throat Medical History: Negative for: Chronic sinus problems/congestion; Middle ear problems Hematologic/Lymphatic Medical History: Negative for: Anemia; Hemophilia; Human Immunodeficiency Virus; Lymphedema; Sickle Cell Disease THAINE, SOUTHERS ([id number]) [id number] Page 16 of 17 Respiratory Medical History: Positive for: Asthma Negative for: Aspiration; Chronic Obstructive Pulmonary Disease (COPD); Pneumothorax; Sleep Apnea; Tuberculosis Cardiovascular Medical History: Positive for: Angina - with COVID; Hypertension Negative for: Arrhythmia; Congestive Heart Failure; Coronary Artery Disease; Deep Vein Thrombosis; Hypotension; Myocardial Infarction; Peripheral Arterial Disease; Peripheral Venous Disease; Phlebitis; Vasculitis Gastrointestinal Medical History: Negative for: Cirrhosis ; Colitis; Crohns; Hepatitis A; Hepatitis B; Hepatitis C Endocrine Medical History: Negative for: Type I Diabetes; Type II Diabetes Genitourinary Medical History: Negative for: End Stage Renal Disease Past Medical History Notes: kidney stone Immunological Medical History: Negative for: Lupus Erythematosus; Raynauds; Scleroderma Integumentary  (Skin) Medical History: Negative for: History of Burn Musculoskeletal Medical History: Negative for: Gout; Rheumatoid Arthritis; Osteoarthritis; Osteomyelitis Neurologic Medical History: Negative for: Dementia; Neuropathy; Quadriplegia; Paraplegia; Seizure Disorder Oncologic Medical History: Negative for: Received Chemotherapy; Received Radiation Psychiatric Medical History: Negative for: Anorexia/bulimia; Confinement Anxiety Past Medical History Notes: anxiety Immunizations Pneumococcal Vaccine: Received Pneumococcal Vaccination: No Implantable Devices None Hospitalization / Surgery History Type of Hospitalization/Surgery COVID PNA 07/22/2019- 11/14/2019 03/27/2020 wound debridement/ skin graft Family and Social History Cancer: Yes - Maternal Grandparents; Diabetes: Yes - Father,Paternal Grandparents; Heart Disease: Yes - Maternal Grandparents; Hereditary SpherocytosisSHIVAAY, RECHER ([id number]) [id number] Page 17 of 17 No;  Hypertension: Yes - Father,Paternal Grandparents; Kidney Disease: No; Lung Disease: Yes - Siblings; Seizures: No; Stroke: No; Thyroid Problems: No; Tuberculosis: No; Never smoker; Marital Status - Married; Alcohol Use: Never; Drug Use: No History; Caffeine Use: Daily - tea, soda; Financial Concerns: No; Food, Clothing or Shelter Needs: No; Support System Lacking: No; Transportation Concerns: No Electronic Signature(s) Signed: 04/13/2023 10:28:43 AM By: [name] MD FACS Entered By: [name] on 04/13/2023 07:04:47 -------------------------------------------------------------------------------- SuperBill Details Patient Name: Date of Service: [name], TO [name] 04/13/2023 Medical Record Number: [id number] Patient Account Number: [id number] Date of Birth/Sex: Treating RN: [birth year] (47 y.o. M) Primary Care Provider: Dorinda Hill Other Clinician: Referring Provider: Treating Provider/Extender: Jana Hakim in Treatment: 143 Diagnosis Coding ICD-10 Codes Code Description [phone] Non-pressure chronic ulcer of other part of left foot with fat layer exposed Facility Procedures : CPT4 Code: [id number] Description: 97597 - DEBRIDE WOUND 1ST 20 SQ CM OR < ICD-10 Diagnosis Description L97.522 Non-pressure chronic ulcer of other part of left foot with fat layer exposed Modifier: Quantity: 1 Physician Procedures : CPT4 Code Description Modifier [id number] - WC PHYS LEVEL 4 - EST PT 25 ICD-10 Diagnosis Description L97.522 Non-pressure chronic ulcer of other part of left foot with fat layer exposed Quantity: 1 : [id number] - WC PHYS DEBR WO ANESTH 20 SQ CM ICD-10 Diagnosis Description L97.522 Non-pressure chronic ulcer of other part of left foot with fat layer exposed Quantity: 1 Electronic Signature(s) Signed: 04/13/2023 10:06:56 AM By: [name] MD FACS Entered By: [name] on 04/13/2023 07:06:56

## 2023-04-20 ENCOUNTER — Encounter (HOSPITAL_BASED_OUTPATIENT_CLINIC_OR_DEPARTMENT_OTHER): Payer: PPO | Admitting: General Surgery

## 2023-04-20 DIAGNOSIS — L97522 Non-pressure chronic ulcer of other part of left foot with fat layer exposed: Secondary | ICD-10-CM | POA: Diagnosis not present

## 2023-04-20 DIAGNOSIS — L97512 Non-pressure chronic ulcer of other part of right foot with fat layer exposed: Secondary | ICD-10-CM | POA: Diagnosis not present

## 2023-04-20 DIAGNOSIS — L89623 Pressure ulcer of left heel, stage 3: Secondary | ICD-10-CM | POA: Diagnosis not present

## 2023-04-24 ENCOUNTER — Other Ambulatory Visit: Payer: Self-pay

## 2023-04-24 MED ORDER — HYDROCODONE-ACETAMINOPHEN 5-325 MG PO TABS: 1.0000 | ORAL_TABLET | Freq: Two times a day (BID) | ORAL | 0 refills | Status: DC | PRN

## 2023-04-24 NOTE — Progress Notes (Signed)
will verbalize understanding of skin care regimen Date Initiated: 07/15/2020 Target Resolution Date: 05/08/2023 Goal Status: Active Ulcer/skin breakdown will have a volume reduction of 30% by week 4 Date Initiated: 07/15/2020 Date Inactivated: 08/20/2020 Target Resolution Date: 09/03/2020 Goal Status: Unmet Unmet Reason: no major changes. Ulcer/skin breakdown will heal within 14 weeks Date Initiated: 12/04/2020 Date Inactivated: 12/10/2020 Target Resolution Date: 12/10/2020 Unmet Reason: wounds still open at 14 Goal Status: Unmet weeks and today 21 weeks. Interventions: Assess patient/caregiver ability to obtain necessary supplies Assess patient/caregiver ability to perform ulcer/skin care regimen upon  admission and as needed Assess ulceration(s) every visit Provide education on ulcer and skin care Treatment Activities: Skin care regimen initiated : 07/15/2020 Topical wound management initiated : 07/15/2020 Notes: Electronic Signature(s) Signed: 04/24/2023 12:35:20 PM By: Brenton Grills Entered By: Brenton Grills on 04/20/2023 06:59:44 -------------------------------------------------------------------------------- Pain Assessment Details Patient Name: Date of Service: [name], [name] 04/20/2023 9:30 A M Medical Record Number: [id number] Patient Account Number: [id number] Date of Birth/Sex: Treating RN: 1975-12-02 (47 y.o. [name] Primary Care Rayshaun Needle: Dorinda Hill Other Clinician: Referring Malin Cervini: Treating Angelly Spearing/Extender: Jana Hakim in Treatment: 144 Active Problems Location of Pain Severity and Description of Pain Patient Has Paino No Site Locations Pain Management and Medication Current Pain Management: Electronic Signature(s) Signed: 04/24/2023 12:35:20 PM By: Lorretta Harp, Henrietta Dine ([id number]) [id number] Page 6 of 8 Entered By: Brenton Grills on 04/20/2023 06:52:04 -------------------------------------------------------------------------------- Patient/Caregiver Education Details Patient Name: Date of Service: [name] 9/12/2024andnbsp9:30 A M Medical Record Number: [id number] Patient Account Number: [id number] Date of Birth/Gender: Treating RN: 06/04/1976 (47 y.o. [name] Primary Care Physician: Dorinda Hill Other Clinician: Referring Physician: Treating Physician/Extender: Jana Hakim in Treatment: 144 Education Assessment Education Provided To: Patient and Caregiver Education Topics Provided Wound/Skin Impairment: Methods: Explain/Verbal Responses: State content correctly Electronic Signature(s) Signed: 04/24/2023 12:35:20 PM By: Brenton Grills Entered By: Brenton Grills on 04/20/2023 07:00:50 -------------------------------------------------------------------------------- Wound Assessment Details Patient Name: Date of Service: [name], TO [name] 04/20/2023 9:30 A M Medical Record Number: [id number] Patient Account Number: [id number] Date of Birth/Sex: Treating RN: Feb 26, 1976 (47 y.o. [name] Primary Care Debbora Ang: Dorinda Hill Other Clinician: Referring Kermitt Harjo: Treating Lynette Noah/Extender: Jana Hakim in Treatment: 144 Wound Status Wound Number: 2 Primary Etiology: Pressure Ulcer Wound Location: Left Calcaneus Wound Status: Open Wounding Event: Pressure Injury Comorbid History: Asthma, Angina, Hypertension Date Acquired: 10/07/2019 Weeks Of Treatment: 144 Clustered Wound: No Photos PHILIPPOS, ROTI ([id number]) [phone].pdf Page 7 of 8 Wound Measurements Length: (cm) 2.8 Width: (cm) 1 Depth: (cm) 0.2 Area: (cm) 2.199 Volume: (cm) 0.44 % Reduction in Area: 91.9% % Reduction in Volume: 98.4% Epithelialization: Small (1-33%) Tunneling: No Undermining: No Wound Description Classification: Category/Stage III Wound Margin: Distinct, outline attached Exudate Amount: Medium Exudate Type: Serosanguineous Exudate Color: red, brown Foul Odor After Cleansing: No Slough/Fibrino No Wound Bed Granulation Amount: Large (67-100%) Exposed Structure Granulation Quality: Pink Fascia Exposed: No Necrotic Amount: Small (1-33%) Fat Layer (Subcutaneous Tissue) Exposed: Yes Necrotic Quality: Adherent Slough Tendon Exposed: No Muscle Exposed: No Joint Exposed: No Bone Exposed: No Periwound Skin Texture Texture Color No Abnormalities Noted: No No Abnormalities Noted: No Callus: No Atrophie Blanche: No Crepitus: No Cyanosis: No Excoriation: No Ecchymosis: No Induration: No Erythema: No Rash: No Hemosiderin Staining: No Scarring: Yes Mottled: No Pallor:  No Moisture Rubor: No No Abnormalities Noted: Yes Temperature / Pain Temperature: No Abnormality Tenderness on Palpation: Yes Treatment  will verbalize understanding of skin care regimen Date Initiated: 07/15/2020 Target Resolution Date: 05/08/2023 Goal Status: Active Ulcer/skin breakdown will have a volume reduction of 30% by week 4 Date Initiated: 07/15/2020 Date Inactivated: 08/20/2020 Target Resolution Date: 09/03/2020 Goal Status: Unmet Unmet Reason: no major changes. Ulcer/skin breakdown will heal within 14 weeks Date Initiated: 12/04/2020 Date Inactivated: 12/10/2020 Target Resolution Date: 12/10/2020 Unmet Reason: wounds still open at 14 Goal Status: Unmet weeks and today 21 weeks. Interventions: Assess patient/caregiver ability to obtain necessary supplies Assess patient/caregiver ability to perform ulcer/skin care regimen upon  admission and as needed Assess ulceration(s) every visit Provide education on ulcer and skin care Treatment Activities: Skin care regimen initiated : 07/15/2020 Topical wound management initiated : 07/15/2020 Notes: Electronic Signature(s) Signed: 04/24/2023 12:35:20 PM By: Brenton Grills Entered By: Brenton Grills on 04/20/2023 06:59:44 -------------------------------------------------------------------------------- Pain Assessment Details Patient Name: Date of Service: [name], [name] 04/20/2023 9:30 A M Medical Record Number: [id number] Patient Account Number: [id number] Date of Birth/Sex: Treating RN: 1975-12-02 (47 y.o. [name] Primary Care Rayshaun Needle: Dorinda Hill Other Clinician: Referring Malin Cervini: Treating Angelly Spearing/Extender: Jana Hakim in Treatment: 144 Active Problems Location of Pain Severity and Description of Pain Patient Has Paino No Site Locations Pain Management and Medication Current Pain Management: Electronic Signature(s) Signed: 04/24/2023 12:35:20 PM By: Lorretta Harp, Henrietta Dine ([id number]) [id number] Page 6 of 8 Entered By: Brenton Grills on 04/20/2023 06:52:04 -------------------------------------------------------------------------------- Patient/Caregiver Education Details Patient Name: Date of Service: [name] 9/12/2024andnbsp9:30 A M Medical Record Number: [id number] Patient Account Number: [id number] Date of Birth/Gender: Treating RN: 06/04/1976 (47 y.o. [name] Primary Care Physician: Dorinda Hill Other Clinician: Referring Physician: Treating Physician/Extender: Jana Hakim in Treatment: 144 Education Assessment Education Provided To: Patient and Caregiver Education Topics Provided Wound/Skin Impairment: Methods: Explain/Verbal Responses: State content correctly Electronic Signature(s) Signed: 04/24/2023 12:35:20 PM By: Brenton Grills Entered By: Brenton Grills on 04/20/2023 07:00:50 -------------------------------------------------------------------------------- Wound Assessment Details Patient Name: Date of Service: [name], TO [name] 04/20/2023 9:30 A M Medical Record Number: [id number] Patient Account Number: [id number] Date of Birth/Sex: Treating RN: Feb 26, 1976 (47 y.o. [name] Primary Care Debbora Ang: Dorinda Hill Other Clinician: Referring Kermitt Harjo: Treating Lynette Noah/Extender: Jana Hakim in Treatment: 144 Wound Status Wound Number: 2 Primary Etiology: Pressure Ulcer Wound Location: Left Calcaneus Wound Status: Open Wounding Event: Pressure Injury Comorbid History: Asthma, Angina, Hypertension Date Acquired: 10/07/2019 Weeks Of Treatment: 144 Clustered Wound: No Photos PHILIPPOS, ROTI ([id number]) [phone].pdf Page 7 of 8 Wound Measurements Length: (cm) 2.8 Width: (cm) 1 Depth: (cm) 0.2 Area: (cm) 2.199 Volume: (cm) 0.44 % Reduction in Area: 91.9% % Reduction in Volume: 98.4% Epithelialization: Small (1-33%) Tunneling: No Undermining: No Wound Description Classification: Category/Stage III Wound Margin: Distinct, outline attached Exudate Amount: Medium Exudate Type: Serosanguineous Exudate Color: red, brown Foul Odor After Cleansing: No Slough/Fibrino No Wound Bed Granulation Amount: Large (67-100%) Exposed Structure Granulation Quality: Pink Fascia Exposed: No Necrotic Amount: Small (1-33%) Fat Layer (Subcutaneous Tissue) Exposed: Yes Necrotic Quality: Adherent Slough Tendon Exposed: No Muscle Exposed: No Joint Exposed: No Bone Exposed: No Periwound Skin Texture Texture Color No Abnormalities Noted: No No Abnormalities Noted: No Callus: No Atrophie Blanche: No Crepitus: No Cyanosis: No Excoriation: No Ecchymosis: No Induration: No Erythema: No Rash: No Hemosiderin Staining: No Scarring: Yes Mottled: No Pallor:  No Moisture Rubor: No No Abnormalities Noted: Yes Temperature / Pain Temperature: No Abnormality Tenderness on Palpation: Yes Treatment  Date Acquired: 144 N/A N/A Weeks of Treatment: Open N/A N/A Wound Status: No N/A N/A Wound Recurrence: 2.8x1x0.2 N/A N/A Measurements L x W x D (cm) 2.199 N/A N/A A (cm) : rea 0.44 N/A N/A Volume (cm) : 91.90% N/A N/A % Reduction in A rea: 98.40% N/A N/A % Reduction in Volume: Category/Stage III N/A N/A Classification: Medium N/A N/A Exudate A mount: Serosanguineous N/A N/A Exudate Type: red, brown N/A N/A Exudate Color: Distinct, outline attached N/A N/A Wound Margin: Large (67-100%) N/A N/A Granulation A mount: Pink N/A N/A Granulation Quality: Small (1-33%) N/A N/A Necrotic A mount: Fat Layer (Subcutaneous Tissue): Yes N/A N/A Exposed Structures: Fascia: No Tendon: No Muscle: No Joint: No Bone: No Small (1-33%) N/A N/A Epithelialization: Debridement - Selective/Open Wound N/A N/A Debridement: Pre-procedure Verification/Time Out 10:06 N/A N/A Taken: Lidocaine 4% Topical Solution N/A N/A Pain Control: Slough N/A N/A Tissue Debrided: Non-Viable Tissue N/A N/A Level: 2.2 N/A N/A Debridement A (sq cm): rea Curette N/A N/A Instrument: Minimum N/A N/A Bleeding: Pressure N/A N/A Hemostasis A  chieved: Procedure was tolerated well N/A N/A Debridement Treatment Response: 2.8x1x0.5 N/A N/A Post Debridement Measurements L x W x D (cm) 1.1 N/A N/A Post Debridement Volume: (cm) Category/Stage III N/A N/A Post Debridement Stage: Scarring: Yes N/A N/A Periwound Skin Texture: Excoriation: No Induration: No Callus: No Crepitus: No Rash: No Dry/Scaly: Yes N/A N/A Periwound Skin Moisture: Maceration: No Atrophie Blanche: No N/A N/A Periwound Skin Color: Cyanosis: No Ecchymosis: No Erythema: No Hemosiderin Staining: No Mottled: No Pallor: No Rubor: No No Abnormality N/A N/A Temperature: Yes N/A N/A Tenderness on Palpation: Debridement N/A N/A Procedures Performed: Treatment Notes Wound #2 (Calcaneus) Wound Laterality: Left Cleanser Normal Saline DELMAS, KALKOWSKI ([id number]) [phone].pdf Page 4 of 8 Discharge Instruction: Cleanse the wound with Normal Saline prior to applying a clean dressing using gauze sponges, not tissue or cotton balls. Wound Cleanser Discharge Instruction: Cleanse the wound with wound cleanser prior to applying a clean dressing using gauze sponges, not tissue or cotton balls. Peri-Wound Care Ketoconazole Cream 2% Discharge Instruction: Apply Ketoconazole mixed with zinc to the periwound Zinc Oxide Ointment 30g tube Discharge Instruction: Apply Zinc Oxide to periwound with each dressing change Topical Gentamicin Discharge Instruction: apply to the wound bed Primary Dressing Maxorb Extra Ag+ Alginate Dressing, 4x4.75 (in/in) Discharge Instruction: Apply to wound bed as instructed Secondary Dressing Drawtex 4x4 in Discharge Instruction: Apply over primary dressing as directed. Optifoam Non-Adhesive Dressing, 4x4 in Discharge Instruction: Apply over primary dressing as directed. Woven Gauze Sponge, Non-Sterile 4x4 in Discharge Instruction: Apply over primary dressing as directed. Secured With 65M Medipore H Soft  Cloth Surgical T ape, 4 x 10 (in/yd) Discharge Instruction: Secure with tape as directed. Compression Wrap Kerlix Roll 4.5x3.1 (in/yd) Discharge Instruction: Apply Kerlix and Coban compression as directed. Compression Stockings Add-Ons [place] [employer] Offloading Boot, Size Regular Electronic Signature(s) Signed: 04/20/2023 10:35:52 AM By: [name] MD FACS Entered By: [name] on 04/20/2023 07:35:52 -------------------------------------------------------------------------------- Multi-Disciplinary Care Plan Details Patient Name: Date of Service: [name], TO [name] 04/20/2023 9:30 A M Medical Record Number: [id number] Patient Account Number: [id number] Date of Birth/Sex: Treating RN: 15-Nov-1975 (47 y.o. [name] Primary Care Joaopedro Eschbach: Dorinda Hill Other Clinician: Referring Jaedynn Bohlken: Treating Dequavion Follette/Extender: Jana Hakim in Treatment: 144 Multidisciplinary Care Plan reviewed with physician Active Inactive Wound/Skin Impairment Nursing Diagnoses: Impaired tissue integrity Knowledge deficit related to ulceration/compromised skin integrity SHADEED, BOARDWINE ([id number]) [phone].pdf Page 5 of 8 Goals: Patient/caregiver  Date Acquired: 144 N/A N/A Weeks of Treatment: Open N/A N/A Wound Status: No N/A N/A Wound Recurrence: 2.8x1x0.2 N/A N/A Measurements L x W x D (cm) 2.199 N/A N/A A (cm) : rea 0.44 N/A N/A Volume (cm) : 91.90% N/A N/A % Reduction in A rea: 98.40% N/A N/A % Reduction in Volume: Category/Stage III N/A N/A Classification: Medium N/A N/A Exudate A mount: Serosanguineous N/A N/A Exudate Type: red, brown N/A N/A Exudate Color: Distinct, outline attached N/A N/A Wound Margin: Large (67-100%) N/A N/A Granulation A mount: Pink N/A N/A Granulation Quality: Small (1-33%) N/A N/A Necrotic A mount: Fat Layer (Subcutaneous Tissue): Yes N/A N/A Exposed Structures: Fascia: No Tendon: No Muscle: No Joint: No Bone: No Small (1-33%) N/A N/A Epithelialization: Debridement - Selective/Open Wound N/A N/A Debridement: Pre-procedure Verification/Time Out 10:06 N/A N/A Taken: Lidocaine 4% Topical Solution N/A N/A Pain Control: Slough N/A N/A Tissue Debrided: Non-Viable Tissue N/A N/A Level: 2.2 N/A N/A Debridement A (sq cm): rea Curette N/A N/A Instrument: Minimum N/A N/A Bleeding: Pressure N/A N/A Hemostasis A  chieved: Procedure was tolerated well N/A N/A Debridement Treatment Response: 2.8x1x0.5 N/A N/A Post Debridement Measurements L x W x D (cm) 1.1 N/A N/A Post Debridement Volume: (cm) Category/Stage III N/A N/A Post Debridement Stage: Scarring: Yes N/A N/A Periwound Skin Texture: Excoriation: No Induration: No Callus: No Crepitus: No Rash: No Dry/Scaly: Yes N/A N/A Periwound Skin Moisture: Maceration: No Atrophie Blanche: No N/A N/A Periwound Skin Color: Cyanosis: No Ecchymosis: No Erythema: No Hemosiderin Staining: No Mottled: No Pallor: No Rubor: No No Abnormality N/A N/A Temperature: Yes N/A N/A Tenderness on Palpation: Debridement N/A N/A Procedures Performed: Treatment Notes Wound #2 (Calcaneus) Wound Laterality: Left Cleanser Normal Saline DELMAS, KALKOWSKI ([id number]) [phone].pdf Page 4 of 8 Discharge Instruction: Cleanse the wound with Normal Saline prior to applying a clean dressing using gauze sponges, not tissue or cotton balls. Wound Cleanser Discharge Instruction: Cleanse the wound with wound cleanser prior to applying a clean dressing using gauze sponges, not tissue or cotton balls. Peri-Wound Care Ketoconazole Cream 2% Discharge Instruction: Apply Ketoconazole mixed with zinc to the periwound Zinc Oxide Ointment 30g tube Discharge Instruction: Apply Zinc Oxide to periwound with each dressing change Topical Gentamicin Discharge Instruction: apply to the wound bed Primary Dressing Maxorb Extra Ag+ Alginate Dressing, 4x4.75 (in/in) Discharge Instruction: Apply to wound bed as instructed Secondary Dressing Drawtex 4x4 in Discharge Instruction: Apply over primary dressing as directed. Optifoam Non-Adhesive Dressing, 4x4 in Discharge Instruction: Apply over primary dressing as directed. Woven Gauze Sponge, Non-Sterile 4x4 in Discharge Instruction: Apply over primary dressing as directed. Secured With 65M Medipore H Soft  Cloth Surgical T ape, 4 x 10 (in/yd) Discharge Instruction: Secure with tape as directed. Compression Wrap Kerlix Roll 4.5x3.1 (in/yd) Discharge Instruction: Apply Kerlix and Coban compression as directed. Compression Stockings Add-Ons [place] Vascular Offloading Boot, Size Regular Electronic Signature(s) Signed: 04/20/2023 10:35:52 AM By: [name] MD FACS Entered By: [name] on 04/20/2023 07:35:52 -------------------------------------------------------------------------------- Multi-Disciplinary Care Plan Details Patient Name: Date of Service: [name], TO [name] 04/20/2023 9:30 A M Medical Record Number: [id number] Patient Account Number: [id number] Date of Birth/Sex: Treating RN: 15-Nov-1975 (47 y.o. [name] Primary Care Joaopedro Eschbach: Dorinda Hill Other Clinician: Referring Jaedynn Bohlken: Treating Dequavion Follette/Extender: Jana Hakim in Treatment: 144 Multidisciplinary Care Plan reviewed with physician Active Inactive Wound/Skin Impairment Nursing Diagnoses: Impaired tissue integrity Knowledge deficit related to ulceration/compromised skin integrity SHADEED, BOARDWINE ([id number]) [phone].pdf Page 5 of 8 Goals: Patient/caregiver

## 2023-04-24 NOTE — Progress Notes (Signed)
[name], [name] ([id number]) [id number].pdf Page 1 of 17 Visit Report for 04/20/2023 Chief Complaint Document Details Patient Name: Date of Service: [name], TO [name] 04/20/2023 9:30 A M Medical Record Number: [id number] Patient Account Number: [id number] Date of Birth/Sex: Treating RN: 05-22-1976 (47 y.o. M) Primary Care Provider: Dorinda Hill Other Clinician: Referring Provider: Treating Provider/Extender: Jana Hakim in Treatment: 144 Information Obtained from: Patient Chief Complaint Bilateral Plantar Foot Ulcers Electronic Signature(s) Signed: 04/20/2023 10:36:05 AM By: [name] MD FACS Entered By: [name] on 04/20/2023 07:36:05 -------------------------------------------------------------------------------- Debridement Details Patient Name: Date of Service: [name], [name] 04/20/2023 9:30 A M Medical Record Number: [id number] Patient Account Number: [id number] Date of Birth/Sex: Treating RN: [birth year] (47 y.o. [name] Primary Care Provider: Dorinda Hill Other Clinician: Referring Provider: Treating Provider/Extender: Jana Hakim in Treatment: 144 Debridement Performed for Assessment: Wound #2 Left Calcaneus Performed By: Physician [name], MD The following information was scribed by: Brenton Grills The information was scribed for: [name] Debridement Type: Debridement Level of Consciousness (Pre-procedure): Awake and Alert Pre-procedure Verification/Time Out Yes - 10:06 Taken: Start Time: 10:07 Pain Control: Lidocaine 4% T opical Solution Percent of Wound Bed Debrided: 100% T Area Debrided (cm): otal 2.2 Tissue and other material debrided: Non-Viable, Slough, Slough Level: Non-Viable Tissue Debridement Description: Selective/Open Wound Instrument: Curette Bleeding: Minimum Hemostasis Achieved: Pressure Response to Treatment: Procedure was tolerated  well Level of Consciousness (Post- Awake and Alert procedure): Post Debridement Measurements of Total Wound Length: (cm) 2.8 Stage: Category/Stage III Width: (cm) 1 Depth: (cm) 0.5 Volume: (cm) 1.1 Character of Wound/Ulcer Post Debridement: Improved Post Procedure Diagnosis [name], [name] ([id number]) [id number] Page 2 of 17 Same as Pre-procedure Electronic Signature(s) Signed: 04/20/2023 12:00:58 PM By: [name] MD FACS Signed: 04/24/2023 12:35:20 PM By: Brenton Grills Entered By: Brenton Grills on 04/20/2023 07:07:27 -------------------------------------------------------------------------------- HPI Details Patient Name: Date of Service: [name], [name] 04/20/2023 9:30 A M Medical Record Number: [id number] Patient Account Number: [id number] Date of Birth/Sex: Treating RN: [birth year] (47 y.o. M) Primary Care Provider: Dorinda Hill Other Clinician: Referring Provider: Treating Provider/Extender: Jana Hakim in Treatment: 144 History of Present Illness HPI Description: Wounds are12/03/2020 upon evaluation today patient presents for initial inspection here in our clinic concerning issues he has been having with the bottoms of his feet bilaterally. He states these actually occurred as wounds when he was hospitalized for 5 months secondary to Covid. He was apparently with tilting bed where he was in an upright position quite frequently and apparently this occurred in some way shape or form during that time. Fortunately there is no sign of active infection at this time. No fevers, chills, nausea, vomiting, or diarrhea. With that being said he still has substantial wounds on the plantar aspects of his feet Theragen require quite a bit of work to get these to heal. He has been using Santyl currently though that is been problematic both in receiving the medication as well as actually paid for it as it is become quite expensive. Prior  to the experience with Covid the patient really did not have any major medical problems other than hypertension he does have some mild generalized weakness following the Covid experience. 07/22/2020 on evaluation today patient appears to be doing okay in regard to his foot ulcers I feel like the wound beds are showing signs of better improvement that I do believe the Iodoflex is helping in this regard. With  particularly at the posterior calcaneal aspect of the wound. There has been a little bit of moisture accumulation with minimal maceration. 01/12/2023: More epithelium has filled in. There is very minimal accumulation of slough. 01/20/2023: The wound is measuring a little bit smaller today. It did measure deeper, but this appears to be secondary to some heaped up senescent skin around the edges. The epithelium that has been present at the posterior aspect of the wound seems to be a bit more robust with greater integrity. 01/26/2023: He reports intense itching to the skin around the wound and there has been some breakdown here. It looks fungal in nature. The wound itself looks pretty good with improved epithelialization with just some slough buildup. He has a little callus along the wound edges. 02/02/2023: He has been treating the periwound skin with an over-the-counter athlete's foot medication and notes significant improvement in his pruritus. The skin looks better here, as well. The wound continues to epithelialize. There is light slough accumulation. 02/10/2023: The periwound skin still looks fairly angry. He reports multiple small blisters that  have opened up. He is not having the intense itching that he had prior, however. There has been more epithelialization at the calcaneus and there is minimal slough accumulation. 02/16/2023: The periwound skin is improving. An online review of dermatology rash images suggest that this may be hand-foot-and-mouth disease, isolated to the feet; the patient says he actually had this as a child. The calcaneus continues to epithelialize and the tissue was quite robust here. Very minimal slough accumulation on the open portion of the wound with a little bit of periwound callus buildup. 03/03/2023: The wound measurements are about the same, but there is more epithelium making its way to the surface. There is a little bit of crust and eschar around the edges and minimal slough on the surface. 03/09/2023: His dressing was applied rather haphazardly and the drawtex was not applied. The Optifoam was also reversed such that the orange side was in contact with his bare skin. He has some maceration around the wound edges, but this did not appear to result in any tissue breakdown. There is a little bit of slough on the surface and minimal eschar around the edges. 03/15/2023: The wound measured narrower today. There is some slough accumulation on the surface and some eschar around the edges. 03/23/2023: The length of the wound remains stable, but it continues to narrow. The skin that has covered the posterior aspect of the calcaneus is more robust and has better tensile integrity. Minimal slough and eschar accumulation. 03/30/2023: The wound length has decreased. This seems to be primarily due to more epithelium covering the posterior aspect of the site. Minimal slough and eschar buildup. 04/07/2023: The wound measured a little bit smaller again today. There is minimal slough accumulation with just a little bit of eschar and callus around the edges. 04/13/2023: The lateral aspect of his wound is starting to epithelialize. There  is very minimal slough on the surface. Moisture control is good. 04/20/2023: Continued, albeit extremely slow, encroachment of epithelium as of the wound surface. Minimal slough. Moisture balance is good. Electronic Signature(s) Signed: 04/20/2023 10:37:04 AM By: [name] MD FACS Entered By: [name] on 04/20/2023 07:37:04 -------------------------------------------------------------------------------- Physical Exam Details Patient Name: Date of Service: [name], [name] 04/20/2023 9:30 A M Medical Record Number: [id number] Patient Account Number: [id number] Date of Birth/Sex: Treating RN: 1976-04-26 (47 y.o. M) Primary Care Provider: Dorinda Hill Other Clinician: Referring Provider: Treating Provider/Extender: Lady Gary  Diabetes: Yes - Father,Paternal Grandparents; Heart Disease: Yes - Maternal Grandparents; Hereditary Spherocytosis: No; Hypertension: Yes - Father,Paternal Grandparents; Kidney Disease: No; Lung Disease: Yes - Siblings; Seizures: No; Stroke: No; Thyroid Problems: No; Tuberculosis: No; Never smoker; Marital Status - Married; Alcohol Use: Never; Drug Use: No History; Caffeine Use: Daily - tea, soda; Financial Concerns: No; Food, Clothing or Shelter Needs: No; Support System Lacking: No; Transportation Concerns: No Electronic Signature(s) Signed: 04/20/2023 12:00:58 PM By: [name] MD FACS Entered By: [name] on 04/20/2023  07:37:15 -------------------------------------------------------------------------------- SuperBill Details Patient Name: Date of Service: [name], [name] 04/20/2023 Medical Record Number: [id number] Patient Account Number: [id number] Date of Birth/Sex: Treating RN: 10-07-1975 (47 y.o. M) Primary Care Provider: Dorinda Hill Other Clinician: Referring Provider: Treating Provider/Extender: Jana Hakim in Treatment: 144 Diagnosis Coding ICD-10 Codes Code Description [phone] Non-pressure chronic ulcer of other part of left foot with fat layer exposed Facility Procedures : CPT4 Code: 46962952 Description: [phone] - DEBRIDE WOUND 1ST 20 SQ CM OR < ICD-10 Diagnosis Description L97.522 Non-pressure chronic ulcer of other part of left foot with fat layer exposed Modifier: Quantity: 1 Physician Procedures : CPT4 Code Description Modifier [id number] - WC PHYS LEVEL 4 - EST PT 25 ICD-10 Diagnosis Description L97.522 Non-pressure chronic ulcer of other part of left foot with fat layer exposed Quantity: 1 : [id number] - WC PHYS DEBR WO ANESTH 20 SQ CM ICD-10 Diagnosis Description L97.522 Non-pressure chronic ulcer of other part of left foot with fat layer exposed Quantity: 1 Electronic Signature(s) Signed: 04/20/2023 10:41:07 AM By: [name] MD FACS Entered By: [name] on 04/20/2023 07:41:07  Diabetes: Yes - Father,Paternal Grandparents; Heart Disease: Yes - Maternal Grandparents; Hereditary Spherocytosis: No; Hypertension: Yes - Father,Paternal Grandparents; Kidney Disease: No; Lung Disease: Yes - Siblings; Seizures: No; Stroke: No; Thyroid Problems: No; Tuberculosis: No; Never smoker; Marital Status - Married; Alcohol Use: Never; Drug Use: No History; Caffeine Use: Daily - tea, soda; Financial Concerns: No; Food, Clothing or Shelter Needs: No; Support System Lacking: No; Transportation Concerns: No Electronic Signature(s) Signed: 04/20/2023 12:00:58 PM By: [name] MD FACS Entered By: [name] on 04/20/2023  07:37:15 -------------------------------------------------------------------------------- SuperBill Details Patient Name: Date of Service: [name], [name] 04/20/2023 Medical Record Number: [id number] Patient Account Number: [id number] Date of Birth/Sex: Treating RN: 10-07-1975 (47 y.o. M) Primary Care Provider: Dorinda Hill Other Clinician: Referring Provider: Treating Provider/Extender: Jana Hakim in Treatment: 144 Diagnosis Coding ICD-10 Codes Code Description [phone] Non-pressure chronic ulcer of other part of left foot with fat layer exposed Facility Procedures : CPT4 Code: 46962952 Description: [phone] - DEBRIDE WOUND 1ST 20 SQ CM OR < ICD-10 Diagnosis Description L97.522 Non-pressure chronic ulcer of other part of left foot with fat layer exposed Modifier: Quantity: 1 Physician Procedures : CPT4 Code Description Modifier [id number] - WC PHYS LEVEL 4 - EST PT 25 ICD-10 Diagnosis Description L97.522 Non-pressure chronic ulcer of other part of left foot with fat layer exposed Quantity: 1 : [id number] - WC PHYS DEBR WO ANESTH 20 SQ CM ICD-10 Diagnosis Description L97.522 Non-pressure chronic ulcer of other part of left foot with fat layer exposed Quantity: 1 Electronic Signature(s) Signed: 04/20/2023 10:41:07 AM By: [name] MD FACS Entered By: [name] on 04/20/2023 07:41:07  [name], [name] ([id number]) [id number].pdf Page 1 of 17 Visit Report for 04/20/2023 Chief Complaint Document Details Patient Name: Date of Service: [name], TO [name] 04/20/2023 9:30 A M Medical Record Number: [id number] Patient Account Number: [id number] Date of Birth/Sex: Treating RN: 05-22-1976 (47 y.o. M) Primary Care Provider: Dorinda Hill Other Clinician: Referring Provider: Treating Provider/Extender: Jana Hakim in Treatment: 144 Information Obtained from: Patient Chief Complaint Bilateral Plantar Foot Ulcers Electronic Signature(s) Signed: 04/20/2023 10:36:05 AM By: [name] MD FACS Entered By: [name] on 04/20/2023 07:36:05 -------------------------------------------------------------------------------- Debridement Details Patient Name: Date of Service: [name], [name] 04/20/2023 9:30 A M Medical Record Number: [id number] Patient Account Number: [id number] Date of Birth/Sex: Treating RN: [birth year] (47 y.o. [name] Primary Care Provider: Dorinda Hill Other Clinician: Referring Provider: Treating Provider/Extender: Jana Hakim in Treatment: 144 Debridement Performed for Assessment: Wound #2 Left Calcaneus Performed By: Physician [name], MD The following information was scribed by: Brenton Grills The information was scribed for: [name] Debridement Type: Debridement Level of Consciousness (Pre-procedure): Awake and Alert Pre-procedure Verification/Time Out Yes - 10:06 Taken: Start Time: 10:07 Pain Control: Lidocaine 4% T opical Solution Percent of Wound Bed Debrided: 100% T Area Debrided (cm): otal 2.2 Tissue and other material debrided: Non-Viable, Slough, Slough Level: Non-Viable Tissue Debridement Description: Selective/Open Wound Instrument: Curette Bleeding: Minimum Hemostasis Achieved: Pressure Response to Treatment: Procedure was tolerated  well Level of Consciousness (Post- Awake and Alert procedure): Post Debridement Measurements of Total Wound Length: (cm) 2.8 Stage: Category/Stage III Width: (cm) 1 Depth: (cm) 0.5 Volume: (cm) 1.1 Character of Wound/Ulcer Post Debridement: Improved Post Procedure Diagnosis [name], [name] ([id number]) [id number] Page 2 of 17 Same as Pre-procedure Electronic Signature(s) Signed: 04/20/2023 12:00:58 PM By: [name] MD FACS Signed: 04/24/2023 12:35:20 PM By: Brenton Grills Entered By: Brenton Grills on 04/20/2023 07:07:27 -------------------------------------------------------------------------------- HPI Details Patient Name: Date of Service: [name], [name] 04/20/2023 9:30 A M Medical Record Number: [id number] Patient Account Number: [id number] Date of Birth/Sex: Treating RN: [birth year] (47 y.o. M) Primary Care Provider: Dorinda Hill Other Clinician: Referring Provider: Treating Provider/Extender: Jana Hakim in Treatment: 144 History of Present Illness HPI Description: Wounds are12/03/2020 upon evaluation today patient presents for initial inspection here in our clinic concerning issues he has been having with the bottoms of his feet bilaterally. He states these actually occurred as wounds when he was hospitalized for 5 months secondary to Covid. He was apparently with tilting bed where he was in an upright position quite frequently and apparently this occurred in some way shape or form during that time. Fortunately there is no sign of active infection at this time. No fevers, chills, nausea, vomiting, or diarrhea. With that being said he still has substantial wounds on the plantar aspects of his feet Theragen require quite a bit of work to get these to heal. He has been using Santyl currently though that is been problematic both in receiving the medication as well as actually paid for it as it is become quite expensive. Prior  to the experience with Covid the patient really did not have any major medical problems other than hypertension he does have some mild generalized weakness following the Covid experience. 07/22/2020 on evaluation today patient appears to be doing okay in regard to his foot ulcers I feel like the wound beds are showing signs of better improvement that I do believe the Iodoflex is helping in this regard. With  [name], [name] ([id number]) [id number].pdf Page 1 of 17 Visit Report for 04/20/2023 Chief Complaint Document Details Patient Name: Date of Service: [name], TO [name] 04/20/2023 9:30 A M Medical Record Number: [id number] Patient Account Number: [id number] Date of Birth/Sex: Treating RN: 05-22-1976 (47 y.o. M) Primary Care Provider: Dorinda Hill Other Clinician: Referring Provider: Treating Provider/Extender: Jana Hakim in Treatment: 144 Information Obtained from: Patient Chief Complaint Bilateral Plantar Foot Ulcers Electronic Signature(s) Signed: 04/20/2023 10:36:05 AM By: [name] MD FACS Entered By: [name] on 04/20/2023 07:36:05 -------------------------------------------------------------------------------- Debridement Details Patient Name: Date of Service: [name], [name] 04/20/2023 9:30 A M Medical Record Number: [id number] Patient Account Number: [id number] Date of Birth/Sex: Treating RN: [birth year] (47 y.o. [name] Primary Care Provider: Dorinda Hill Other Clinician: Referring Provider: Treating Provider/Extender: Jana Hakim in Treatment: 144 Debridement Performed for Assessment: Wound #2 Left Calcaneus Performed By: Physician [name], MD The following information was scribed by: Brenton Grills The information was scribed for: [name] Debridement Type: Debridement Level of Consciousness (Pre-procedure): Awake and Alert Pre-procedure Verification/Time Out Yes - 10:06 Taken: Start Time: 10:07 Pain Control: Lidocaine 4% T opical Solution Percent of Wound Bed Debrided: 100% T Area Debrided (cm): otal 2.2 Tissue and other material debrided: Non-Viable, Slough, Slough Level: Non-Viable Tissue Debridement Description: Selective/Open Wound Instrument: Curette Bleeding: Minimum Hemostasis Achieved: Pressure Response to Treatment: Procedure was tolerated  well Level of Consciousness (Post- Awake and Alert procedure): Post Debridement Measurements of Total Wound Length: (cm) 2.8 Stage: Category/Stage III Width: (cm) 1 Depth: (cm) 0.5 Volume: (cm) 1.1 Character of Wound/Ulcer Post Debridement: Improved Post Procedure Diagnosis [name], [name] ([id number]) [id number] Page 2 of 17 Same as Pre-procedure Electronic Signature(s) Signed: 04/20/2023 12:00:58 PM By: [name] MD FACS Signed: 04/24/2023 12:35:20 PM By: Brenton Grills Entered By: Brenton Grills on 04/20/2023 07:07:27 -------------------------------------------------------------------------------- HPI Details Patient Name: Date of Service: [name], [name] 04/20/2023 9:30 A M Medical Record Number: [id number] Patient Account Number: [id number] Date of Birth/Sex: Treating RN: [birth year] (47 y.o. M) Primary Care Provider: Dorinda Hill Other Clinician: Referring Provider: Treating Provider/Extender: Jana Hakim in Treatment: 144 History of Present Illness HPI Description: Wounds are12/03/2020 upon evaluation today patient presents for initial inspection here in our clinic concerning issues he has been having with the bottoms of his feet bilaterally. He states these actually occurred as wounds when he was hospitalized for 5 months secondary to Covid. He was apparently with tilting bed where he was in an upright position quite frequently and apparently this occurred in some way shape or form during that time. Fortunately there is no sign of active infection at this time. No fevers, chills, nausea, vomiting, or diarrhea. With that being said he still has substantial wounds on the plantar aspects of his feet Theragen require quite a bit of work to get these to heal. He has been using Santyl currently though that is been problematic both in receiving the medication as well as actually paid for it as it is become quite expensive. Prior  to the experience with Covid the patient really did not have any major medical problems other than hypertension he does have some mild generalized weakness following the Covid experience. 07/22/2020 on evaluation today patient appears to be doing okay in regard to his foot ulcers I feel like the wound beds are showing signs of better improvement that I do believe the Iodoflex is helping in this regard. With  particularly at the posterior calcaneal aspect of the wound. There has been a little bit of moisture accumulation with minimal maceration. 01/12/2023: More epithelium has filled in. There is very minimal accumulation of slough. 01/20/2023: The wound is measuring a little bit smaller today. It did measure deeper, but this appears to be secondary to some heaped up senescent skin around the edges. The epithelium that has been present at the posterior aspect of the wound seems to be a bit more robust with greater integrity. 01/26/2023: He reports intense itching to the skin around the wound and there has been some breakdown here. It looks fungal in nature. The wound itself looks pretty good with improved epithelialization with just some slough buildup. He has a little callus along the wound edges. 02/02/2023: He has been treating the periwound skin with an over-the-counter athlete's foot medication and notes significant improvement in his pruritus. The skin looks better here, as well. The wound continues to epithelialize. There is light slough accumulation. 02/10/2023: The periwound skin still looks fairly angry. He reports multiple small blisters that  have opened up. He is not having the intense itching that he had prior, however. There has been more epithelialization at the calcaneus and there is minimal slough accumulation. 02/16/2023: The periwound skin is improving. An online review of dermatology rash images suggest that this may be hand-foot-and-mouth disease, isolated to the feet; the patient says he actually had this as a child. The calcaneus continues to epithelialize and the tissue was quite robust here. Very minimal slough accumulation on the open portion of the wound with a little bit of periwound callus buildup. 03/03/2023: The wound measurements are about the same, but there is more epithelium making its way to the surface. There is a little bit of crust and eschar around the edges and minimal slough on the surface. 03/09/2023: His dressing was applied rather haphazardly and the drawtex was not applied. The Optifoam was also reversed such that the orange side was in contact with his bare skin. He has some maceration around the wound edges, but this did not appear to result in any tissue breakdown. There is a little bit of slough on the surface and minimal eschar around the edges. 03/15/2023: The wound measured narrower today. There is some slough accumulation on the surface and some eschar around the edges. 03/23/2023: The length of the wound remains stable, but it continues to narrow. The skin that has covered the posterior aspect of the calcaneus is more robust and has better tensile integrity. Minimal slough and eschar accumulation. 03/30/2023: The wound length has decreased. This seems to be primarily due to more epithelium covering the posterior aspect of the site. Minimal slough and eschar buildup. 04/07/2023: The wound measured a little bit smaller again today. There is minimal slough accumulation with just a little bit of eschar and callus around the edges. 04/13/2023: The lateral aspect of his wound is starting to epithelialize. There  is very minimal slough on the surface. Moisture control is good. 04/20/2023: Continued, albeit extremely slow, encroachment of epithelium as of the wound surface. Minimal slough. Moisture balance is good. Electronic Signature(s) Signed: 04/20/2023 10:37:04 AM By: [name] MD FACS Entered By: [name] on 04/20/2023 07:37:04 -------------------------------------------------------------------------------- Physical Exam Details Patient Name: Date of Service: [name], [name] 04/20/2023 9:30 A M Medical Record Number: [id number] Patient Account Number: [id number] Date of Birth/Sex: Treating RN: 1976-04-26 (47 y.o. M) Primary Care Provider: Dorinda Hill Other Clinician: Referring Provider: Treating Provider/Extender: Lady Gary  [name], [name] ([id number]) [id number].pdf Page 1 of 17 Visit Report for 04/20/2023 Chief Complaint Document Details Patient Name: Date of Service: [name], TO [name] 04/20/2023 9:30 A M Medical Record Number: [id number] Patient Account Number: [id number] Date of Birth/Sex: Treating RN: 05-22-1976 (47 y.o. M) Primary Care Provider: Dorinda Hill Other Clinician: Referring Provider: Treating Provider/Extender: Jana Hakim in Treatment: 144 Information Obtained from: Patient Chief Complaint Bilateral Plantar Foot Ulcers Electronic Signature(s) Signed: 04/20/2023 10:36:05 AM By: [name] MD FACS Entered By: [name] on 04/20/2023 07:36:05 -------------------------------------------------------------------------------- Debridement Details Patient Name: Date of Service: [name], [name] 04/20/2023 9:30 A M Medical Record Number: [id number] Patient Account Number: [id number] Date of Birth/Sex: Treating RN: [birth year] (47 y.o. [name] Primary Care Provider: Dorinda Hill Other Clinician: Referring Provider: Treating Provider/Extender: Jana Hakim in Treatment: 144 Debridement Performed for Assessment: Wound #2 Left Calcaneus Performed By: Physician [name], MD The following information was scribed by: Brenton Grills The information was scribed for: [name] Debridement Type: Debridement Level of Consciousness (Pre-procedure): Awake and Alert Pre-procedure Verification/Time Out Yes - 10:06 Taken: Start Time: 10:07 Pain Control: Lidocaine 4% T opical Solution Percent of Wound Bed Debrided: 100% T Area Debrided (cm): otal 2.2 Tissue and other material debrided: Non-Viable, Slough, Slough Level: Non-Viable Tissue Debridement Description: Selective/Open Wound Instrument: Curette Bleeding: Minimum Hemostasis Achieved: Pressure Response to Treatment: Procedure was tolerated  well Level of Consciousness (Post- Awake and Alert procedure): Post Debridement Measurements of Total Wound Length: (cm) 2.8 Stage: Category/Stage III Width: (cm) 1 Depth: (cm) 0.5 Volume: (cm) 1.1 Character of Wound/Ulcer Post Debridement: Improved Post Procedure Diagnosis [name], [name] ([id number]) [id number] Page 2 of 17 Same as Pre-procedure Electronic Signature(s) Signed: 04/20/2023 12:00:58 PM By: [name] MD FACS Signed: 04/24/2023 12:35:20 PM By: Brenton Grills Entered By: Brenton Grills on 04/20/2023 07:07:27 -------------------------------------------------------------------------------- HPI Details Patient Name: Date of Service: [name], [name] 04/20/2023 9:30 A M Medical Record Number: [id number] Patient Account Number: [id number] Date of Birth/Sex: Treating RN: [birth year] (47 y.o. M) Primary Care Provider: Dorinda Hill Other Clinician: Referring Provider: Treating Provider/Extender: Jana Hakim in Treatment: 144 History of Present Illness HPI Description: Wounds are12/03/2020 upon evaluation today patient presents for initial inspection here in our clinic concerning issues he has been having with the bottoms of his feet bilaterally. He states these actually occurred as wounds when he was hospitalized for 5 months secondary to Covid. He was apparently with tilting bed where he was in an upright position quite frequently and apparently this occurred in some way shape or form during that time. Fortunately there is no sign of active infection at this time. No fevers, chills, nausea, vomiting, or diarrhea. With that being said he still has substantial wounds on the plantar aspects of his feet Theragen require quite a bit of work to get these to heal. He has been using Santyl currently though that is been problematic both in receiving the medication as well as actually paid for it as it is become quite expensive. Prior  to the experience with Covid the patient really did not have any major medical problems other than hypertension he does have some mild generalized weakness following the Covid experience. 07/22/2020 on evaluation today patient appears to be doing okay in regard to his foot ulcers I feel like the wound beds are showing signs of better improvement that I do believe the Iodoflex is helping in this regard. With  Diabetes: Yes - Father,Paternal Grandparents; Heart Disease: Yes - Maternal Grandparents; Hereditary Spherocytosis: No; Hypertension: Yes - Father,Paternal Grandparents; Kidney Disease: No; Lung Disease: Yes - Siblings; Seizures: No; Stroke: No; Thyroid Problems: No; Tuberculosis: No; Never smoker; Marital Status - Married; Alcohol Use: Never; Drug Use: No History; Caffeine Use: Daily - tea, soda; Financial Concerns: No; Food, Clothing or Shelter Needs: No; Support System Lacking: No; Transportation Concerns: No Electronic Signature(s) Signed: 04/20/2023 12:00:58 PM By: [name] MD FACS Entered By: [name] on 04/20/2023  07:37:15 -------------------------------------------------------------------------------- SuperBill Details Patient Name: Date of Service: [name], [name] 04/20/2023 Medical Record Number: [id number] Patient Account Number: [id number] Date of Birth/Sex: Treating RN: 10-07-1975 (47 y.o. M) Primary Care Provider: Dorinda Hill Other Clinician: Referring Provider: Treating Provider/Extender: Jana Hakim in Treatment: 144 Diagnosis Coding ICD-10 Codes Code Description [phone] Non-pressure chronic ulcer of other part of left foot with fat layer exposed Facility Procedures : CPT4 Code: 46962952 Description: [phone] - DEBRIDE WOUND 1ST 20 SQ CM OR < ICD-10 Diagnosis Description L97.522 Non-pressure chronic ulcer of other part of left foot with fat layer exposed Modifier: Quantity: 1 Physician Procedures : CPT4 Code Description Modifier [id number] - WC PHYS LEVEL 4 - EST PT 25 ICD-10 Diagnosis Description L97.522 Non-pressure chronic ulcer of other part of left foot with fat layer exposed Quantity: 1 : [id number] - WC PHYS DEBR WO ANESTH 20 SQ CM ICD-10 Diagnosis Description L97.522 Non-pressure chronic ulcer of other part of left foot with fat layer exposed Quantity: 1 Electronic Signature(s) Signed: 04/20/2023 10:41:07 AM By: [name] MD FACS Entered By: [name] on 04/20/2023 07:41:07  Diabetes: Yes - Father,Paternal Grandparents; Heart Disease: Yes - Maternal Grandparents; Hereditary Spherocytosis: No; Hypertension: Yes - Father,Paternal Grandparents; Kidney Disease: No; Lung Disease: Yes - Siblings; Seizures: No; Stroke: No; Thyroid Problems: No; Tuberculosis: No; Never smoker; Marital Status - Married; Alcohol Use: Never; Drug Use: No History; Caffeine Use: Daily - tea, soda; Financial Concerns: No; Food, Clothing or Shelter Needs: No; Support System Lacking: No; Transportation Concerns: No Electronic Signature(s) Signed: 04/20/2023 12:00:58 PM By: [name] MD FACS Entered By: [name] on 04/20/2023  07:37:15 -------------------------------------------------------------------------------- SuperBill Details Patient Name: Date of Service: [name], [name] 04/20/2023 Medical Record Number: [id number] Patient Account Number: [id number] Date of Birth/Sex: Treating RN: 10-07-1975 (47 y.o. M) Primary Care Provider: Dorinda Hill Other Clinician: Referring Provider: Treating Provider/Extender: Jana Hakim in Treatment: 144 Diagnosis Coding ICD-10 Codes Code Description [phone] Non-pressure chronic ulcer of other part of left foot with fat layer exposed Facility Procedures : CPT4 Code: 46962952 Description: [phone] - DEBRIDE WOUND 1ST 20 SQ CM OR < ICD-10 Diagnosis Description L97.522 Non-pressure chronic ulcer of other part of left foot with fat layer exposed Modifier: Quantity: 1 Physician Procedures : CPT4 Code Description Modifier [id number] 99214 - WC PHYS LEVEL 4 - EST PT 25 ICD-10 Diagnosis Description L97.522 Non-pressure chronic ulcer of other part of left foot with fat layer exposed Quantity: 1 : [id number] - WC PHYS DEBR WO ANESTH 20 SQ CM ICD-10 Diagnosis Description L97.522 Non-pressure chronic ulcer of other part of left foot with fat layer exposed Quantity: 1 Electronic Signature(s) Signed: 04/20/2023 10:41:07 AM By: [name] MD FACS Entered By: [name] on 04/20/2023 07:41:07  particularly at the posterior calcaneal aspect of the wound. There has been a little bit of moisture accumulation with minimal maceration. 01/12/2023: More epithelium has filled in. There is very minimal accumulation of slough. 01/20/2023: The wound is measuring a little bit smaller today. It did measure deeper, but this appears to be secondary to some heaped up senescent skin around the edges. The epithelium that has been present at the posterior aspect of the wound seems to be a bit more robust with greater integrity. 01/26/2023: He reports intense itching to the skin around the wound and there has been some breakdown here. It looks fungal in nature. The wound itself looks pretty good with improved epithelialization with just some slough buildup. He has a little callus along the wound edges. 02/02/2023: He has been treating the periwound skin with an over-the-counter athlete's foot medication and notes significant improvement in his pruritus. The skin looks better here, as well. The wound continues to epithelialize. There is light slough accumulation. 02/10/2023: The periwound skin still looks fairly angry. He reports multiple small blisters that  have opened up. He is not having the intense itching that he had prior, however. There has been more epithelialization at the calcaneus and there is minimal slough accumulation. 02/16/2023: The periwound skin is improving. An online review of dermatology rash images suggest that this may be hand-foot-and-mouth disease, isolated to the feet; the patient says he actually had this as a child. The calcaneus continues to epithelialize and the tissue was quite robust here. Very minimal slough accumulation on the open portion of the wound with a little bit of periwound callus buildup. 03/03/2023: The wound measurements are about the same, but there is more epithelium making its way to the surface. There is a little bit of crust and eschar around the edges and minimal slough on the surface. 03/09/2023: His dressing was applied rather haphazardly and the drawtex was not applied. The Optifoam was also reversed such that the orange side was in contact with his bare skin. He has some maceration around the wound edges, but this did not appear to result in any tissue breakdown. There is a little bit of slough on the surface and minimal eschar around the edges. 03/15/2023: The wound measured narrower today. There is some slough accumulation on the surface and some eschar around the edges. 03/23/2023: The length of the wound remains stable, but it continues to narrow. The skin that has covered the posterior aspect of the calcaneus is more robust and has better tensile integrity. Minimal slough and eschar accumulation. 03/30/2023: The wound length has decreased. This seems to be primarily due to more epithelium covering the posterior aspect of the site. Minimal slough and eschar buildup. 04/07/2023: The wound measured a little bit smaller again today. There is minimal slough accumulation with just a little bit of eschar and callus around the edges. 04/13/2023: The lateral aspect of his wound is starting to epithelialize. There  is very minimal slough on the surface. Moisture control is good. 04/20/2023: Continued, albeit extremely slow, encroachment of epithelium as of the wound surface. Minimal slough. Moisture balance is good. Electronic Signature(s) Signed: 04/20/2023 10:37:04 AM By: [name] MD FACS Entered By: [name] on 04/20/2023 07:37:04 -------------------------------------------------------------------------------- Physical Exam Details Patient Name: Date of Service: [name], [name] 04/20/2023 9:30 A M Medical Record Number: [id number] Patient Account Number: [id number] Date of Birth/Sex: Treating RN: 1976-04-26 (47 y.o. M) Primary Care Provider: Dorinda Hill Other Clinician: Referring Provider: Treating Provider/Extender: Lady Gary  [name], [name] ([id number]) [id number].pdf Page 1 of 17 Visit Report for 04/20/2023 Chief Complaint Document Details Patient Name: Date of Service: [name], TO [name] 04/20/2023 9:30 A M Medical Record Number: [id number] Patient Account Number: [id number] Date of Birth/Sex: Treating RN: 05-22-1976 (47 y.o. M) Primary Care Provider: Dorinda Hill Other Clinician: Referring Provider: Treating Provider/Extender: Jana Hakim in Treatment: 144 Information Obtained from: Patient Chief Complaint Bilateral Plantar Foot Ulcers Electronic Signature(s) Signed: 04/20/2023 10:36:05 AM By: [name] MD FACS Entered By: [name] on 04/20/2023 07:36:05 -------------------------------------------------------------------------------- Debridement Details Patient Name: Date of Service: [name], [name] 04/20/2023 9:30 A M Medical Record Number: [id number] Patient Account Number: [id number] Date of Birth/Sex: Treating RN: [birth year] (47 y.o. [name] Primary Care Provider: Dorinda Hill Other Clinician: Referring Provider: Treating Provider/Extender: Jana Hakim in Treatment: 144 Debridement Performed for Assessment: Wound #2 Left Calcaneus Performed By: Physician [name], MD The following information was scribed by: Brenton Grills The information was scribed for: [name] Debridement Type: Debridement Level of Consciousness (Pre-procedure): Awake and Alert Pre-procedure Verification/Time Out Yes - 10:06 Taken: Start Time: 10:07 Pain Control: Lidocaine 4% T opical Solution Percent of Wound Bed Debrided: 100% T Area Debrided (cm): otal 2.2 Tissue and other material debrided: Non-Viable, Slough, Slough Level: Non-Viable Tissue Debridement Description: Selective/Open Wound Instrument: Curette Bleeding: Minimum Hemostasis Achieved: Pressure Response to Treatment: Procedure was tolerated  well Level of Consciousness (Post- Awake and Alert procedure): Post Debridement Measurements of Total Wound Length: (cm) 2.8 Stage: Category/Stage III Width: (cm) 1 Depth: (cm) 0.5 Volume: (cm) 1.1 Character of Wound/Ulcer Post Debridement: Improved Post Procedure Diagnosis [name], [name] ([id number]) [id number] Page 2 of 17 Same as Pre-procedure Electronic Signature(s) Signed: 04/20/2023 12:00:58 PM By: [name] MD FACS Signed: 04/24/2023 12:35:20 PM By: Brenton Grills Entered By: Brenton Grills on 04/20/2023 07:07:27 -------------------------------------------------------------------------------- HPI Details Patient Name: Date of Service: [name], [name] 04/20/2023 9:30 A M Medical Record Number: [id number] Patient Account Number: [id number] Date of Birth/Sex: Treating RN: [birth year] (47 y.o. M) Primary Care Provider: Dorinda Hill Other Clinician: Referring Provider: Treating Provider/Extender: Jana Hakim in Treatment: 144 History of Present Illness HPI Description: Wounds are12/03/2020 upon evaluation today patient presents for initial inspection here in our clinic concerning issues he has been having with the bottoms of his feet bilaterally. He states these actually occurred as wounds when he was hospitalized for 5 months secondary to Covid. He was apparently with tilting bed where he was in an upright position quite frequently and apparently this occurred in some way shape or form during that time. Fortunately there is no sign of active infection at this time. No fevers, chills, nausea, vomiting, or diarrhea. With that being said he still has substantial wounds on the plantar aspects of his feet Theragen require quite a bit of work to get these to heal. He has been using Santyl currently though that is been problematic both in receiving the medication as well as actually paid for it as it is become quite expensive. Prior  to the experience with Covid the patient really did not have any major medical problems other than hypertension he does have some mild generalized weakness following the Covid experience. 07/22/2020 on evaluation today patient appears to be doing okay in regard to his foot ulcers I feel like the wound beds are showing signs of better improvement that I do believe the Iodoflex is helping in this regard. With  Diabetes: Yes - Father,Paternal Grandparents; Heart Disease: Yes - Maternal Grandparents; Hereditary Spherocytosis: No; Hypertension: Yes - Father,Paternal Grandparents; Kidney Disease: No; Lung Disease: Yes - Siblings; Seizures: No; Stroke: No; Thyroid Problems: No; Tuberculosis: No; Never smoker; Marital Status - Married; Alcohol Use: Never; Drug Use: No History; Caffeine Use: Daily - tea, soda; Financial Concerns: No; Food, Clothing or Shelter Needs: No; Support System Lacking: No; Transportation Concerns: No Electronic Signature(s) Signed: 04/20/2023 12:00:58 PM By: [name] MD FACS Entered By: [name] on 04/20/2023  07:37:15 -------------------------------------------------------------------------------- SuperBill Details Patient Name: Date of Service: [name], [name] 04/20/2023 Medical Record Number: [id number] Patient Account Number: [id number] Date of Birth/Sex: Treating RN: 10-07-1975 (47 y.o. M) Primary Care Provider: Dorinda Hill Other Clinician: Referring Provider: Treating Provider/Extender: Jana Hakim in Treatment: 144 Diagnosis Coding ICD-10 Codes Code Description [phone] Non-pressure chronic ulcer of other part of left foot with fat layer exposed Facility Procedures : CPT4 Code: 46962952 Description: [phone] - DEBRIDE WOUND 1ST 20 SQ CM OR < ICD-10 Diagnosis Description L97.522 Non-pressure chronic ulcer of other part of left foot with fat layer exposed Modifier: Quantity: 1 Physician Procedures : CPT4 Code Description Modifier [id number] 99214 - WC PHYS LEVEL 4 - EST PT 25 ICD-10 Diagnosis Description L97.522 Non-pressure chronic ulcer of other part of left foot with fat layer exposed Quantity: 1 : [id number] - WC PHYS DEBR WO ANESTH 20 SQ CM ICD-10 Diagnosis Description L97.522 Non-pressure chronic ulcer of other part of left foot with fat layer exposed Quantity: 1 Electronic Signature(s) Signed: 04/20/2023 10:41:07 AM By: [name] MD FACS Entered By: [name] on 04/20/2023 07:41:07  [name], [name] ([id number]) [id number].pdf Page 1 of 17 Visit Report for 04/20/2023 Chief Complaint Document Details Patient Name: Date of Service: [name], TO [name] 04/20/2023 9:30 A M Medical Record Number: [id number] Patient Account Number: [id number] Date of Birth/Sex: Treating RN: 05-22-1976 (47 y.o. M) Primary Care Provider: Dorinda Hill Other Clinician: Referring Provider: Treating Provider/Extender: Jana Hakim in Treatment: 144 Information Obtained from: Patient Chief Complaint Bilateral Plantar Foot Ulcers Electronic Signature(s) Signed: 04/20/2023 10:36:05 AM By: [name] MD FACS Entered By: [name] on 04/20/2023 07:36:05 -------------------------------------------------------------------------------- Debridement Details Patient Name: Date of Service: [name], [name] 04/20/2023 9:30 A M Medical Record Number: [id number] Patient Account Number: [id number] Date of Birth/Sex: Treating RN: [birth year] (47 y.o. [name] Primary Care Provider: Dorinda Hill Other Clinician: Referring Provider: Treating Provider/Extender: Jana Hakim in Treatment: 144 Debridement Performed for Assessment: Wound #2 Left Calcaneus Performed By: Physician [name], MD The following information was scribed by: Brenton Grills The information was scribed for: [name] Debridement Type: Debridement Level of Consciousness (Pre-procedure): Awake and Alert Pre-procedure Verification/Time Out Yes - 10:06 Taken: Start Time: 10:07 Pain Control: Lidocaine 4% T opical Solution Percent of Wound Bed Debrided: 100% T Area Debrided (cm): otal 2.2 Tissue and other material debrided: Non-Viable, Slough, Slough Level: Non-Viable Tissue Debridement Description: Selective/Open Wound Instrument: Curette Bleeding: Minimum Hemostasis Achieved: Pressure Response to Treatment: Procedure was tolerated  well Level of Consciousness (Post- Awake and Alert procedure): Post Debridement Measurements of Total Wound Length: (cm) 2.8 Stage: Category/Stage III Width: (cm) 1 Depth: (cm) 0.5 Volume: (cm) 1.1 Character of Wound/Ulcer Post Debridement: Improved Post Procedure Diagnosis [name], [name] ([id number]) [id number] Page 2 of 17 Same as Pre-procedure Electronic Signature(s) Signed: 04/20/2023 12:00:58 PM By: [name] MD FACS Signed: 04/24/2023 12:35:20 PM By: Brenton Grills Entered By: Brenton Grills on 04/20/2023 07:07:27 -------------------------------------------------------------------------------- HPI Details Patient Name: Date of Service: [name], [name] 04/20/2023 9:30 A M Medical Record Number: [id number] Patient Account Number: [id number] Date of Birth/Sex: Treating RN: [birth year] (47 y.o. M) Primary Care Provider: Dorinda Hill Other Clinician: Referring Provider: Treating Provider/Extender: Jana Hakim in Treatment: 144 History of Present Illness HPI Description: Wounds are12/03/2020 upon evaluation today patient presents for initial inspection here in our clinic concerning issues he has been having with the bottoms of his feet bilaterally. He states these actually occurred as wounds when he was hospitalized for 5 months secondary to Covid. He was apparently with tilting bed where he was in an upright position quite frequently and apparently this occurred in some way shape or form during that time. Fortunately there is no sign of active infection at this time. No fevers, chills, nausea, vomiting, or diarrhea. With that being said he still has substantial wounds on the plantar aspects of his feet Theragen require quite a bit of work to get these to heal. He has been using Santyl currently though that is been problematic both in receiving the medication as well as actually paid for it as it is become quite expensive. Prior  to the experience with Covid the patient really did not have any major medical problems other than hypertension he does have some mild generalized weakness following the Covid experience. 07/22/2020 on evaluation today patient appears to be doing okay in regard to his foot ulcers I feel like the wound beds are showing signs of better improvement that I do believe the Iodoflex is helping in this regard. With  particularly at the posterior calcaneal aspect of the wound. There has been a little bit of moisture accumulation with minimal maceration. 01/12/2023: More epithelium has filled in. There is very minimal accumulation of slough. 01/20/2023: The wound is measuring a little bit smaller today. It did measure deeper, but this appears to be secondary to some heaped up senescent skin around the edges. The epithelium that has been present at the posterior aspect of the wound seems to be a bit more robust with greater integrity. 01/26/2023: He reports intense itching to the skin around the wound and there has been some breakdown here. It looks fungal in nature. The wound itself looks pretty good with improved epithelialization with just some slough buildup. He has a little callus along the wound edges. 02/02/2023: He has been treating the periwound skin with an over-the-counter athlete's foot medication and notes significant improvement in his pruritus. The skin looks better here, as well. The wound continues to epithelialize. There is light slough accumulation. 02/10/2023: The periwound skin still looks fairly angry. He reports multiple small blisters that  have opened up. He is not having the intense itching that he had prior, however. There has been more epithelialization at the calcaneus and there is minimal slough accumulation. 02/16/2023: The periwound skin is improving. An online review of dermatology rash images suggest that this may be hand-foot-and-mouth disease, isolated to the feet; the patient says he actually had this as a child. The calcaneus continues to epithelialize and the tissue was quite robust here. Very minimal slough accumulation on the open portion of the wound with a little bit of periwound callus buildup. 03/03/2023: The wound measurements are about the same, but there is more epithelium making its way to the surface. There is a little bit of crust and eschar around the edges and minimal slough on the surface. 03/09/2023: His dressing was applied rather haphazardly and the drawtex was not applied. The Optifoam was also reversed such that the orange side was in contact with his bare skin. He has some maceration around the wound edges, but this did not appear to result in any tissue breakdown. There is a little bit of slough on the surface and minimal eschar around the edges. 03/15/2023: The wound measured narrower today. There is some slough accumulation on the surface and some eschar around the edges. 03/23/2023: The length of the wound remains stable, but it continues to narrow. The skin that has covered the posterior aspect of the calcaneus is more robust and has better tensile integrity. Minimal slough and eschar accumulation. 03/30/2023: The wound length has decreased. This seems to be primarily due to more epithelium covering the posterior aspect of the site. Minimal slough and eschar buildup. 04/07/2023: The wound measured a little bit smaller again today. There is minimal slough accumulation with just a little bit of eschar and callus around the edges. 04/13/2023: The lateral aspect of his wound is starting to epithelialize. There  is very minimal slough on the surface. Moisture control is good. 04/20/2023: Continued, albeit extremely slow, encroachment of epithelium as of the wound surface. Minimal slough. Moisture balance is good. Electronic Signature(s) Signed: 04/20/2023 10:37:04 AM By: [name] MD FACS Entered By: [name] on 04/20/2023 07:37:04 -------------------------------------------------------------------------------- Physical Exam Details Patient Name: Date of Service: [name], [name] 04/20/2023 9:30 A M Medical Record Number: [id number] Patient Account Number: [id number] Date of Birth/Sex: Treating RN: 1976-04-26 (47 y.o. M) Primary Care Provider: Dorinda Hill Other Clinician: Referring Provider: Treating Provider/Extender: Lady Gary  particularly at the posterior calcaneal aspect of the wound. There has been a little bit of moisture accumulation with minimal maceration. 01/12/2023: More epithelium has filled in. There is very minimal accumulation of slough. 01/20/2023: The wound is measuring a little bit smaller today. It did measure deeper, but this appears to be secondary to some heaped up senescent skin around the edges. The epithelium that has been present at the posterior aspect of the wound seems to be a bit more robust with greater integrity. 01/26/2023: He reports intense itching to the skin around the wound and there has been some breakdown here. It looks fungal in nature. The wound itself looks pretty good with improved epithelialization with just some slough buildup. He has a little callus along the wound edges. 02/02/2023: He has been treating the periwound skin with an over-the-counter athlete's foot medication and notes significant improvement in his pruritus. The skin looks better here, as well. The wound continues to epithelialize. There is light slough accumulation. 02/10/2023: The periwound skin still looks fairly angry. He reports multiple small blisters that  have opened up. He is not having the intense itching that he had prior, however. There has been more epithelialization at the calcaneus and there is minimal slough accumulation. 02/16/2023: The periwound skin is improving. An online review of dermatology rash images suggest that this may be hand-foot-and-mouth disease, isolated to the feet; the patient says he actually had this as a child. The calcaneus continues to epithelialize and the tissue was quite robust here. Very minimal slough accumulation on the open portion of the wound with a little bit of periwound callus buildup. 03/03/2023: The wound measurements are about the same, but there is more epithelium making its way to the surface. There is a little bit of crust and eschar around the edges and minimal slough on the surface. 03/09/2023: His dressing was applied rather haphazardly and the drawtex was not applied. The Optifoam was also reversed such that the orange side was in contact with his bare skin. He has some maceration around the wound edges, but this did not appear to result in any tissue breakdown. There is a little bit of slough on the surface and minimal eschar around the edges. 03/15/2023: The wound measured narrower today. There is some slough accumulation on the surface and some eschar around the edges. 03/23/2023: The length of the wound remains stable, but it continues to narrow. The skin that has covered the posterior aspect of the calcaneus is more robust and has better tensile integrity. Minimal slough and eschar accumulation. 03/30/2023: The wound length has decreased. This seems to be primarily due to more epithelium covering the posterior aspect of the site. Minimal slough and eschar buildup. 04/07/2023: The wound measured a little bit smaller again today. There is minimal slough accumulation with just a little bit of eschar and callus around the edges. 04/13/2023: The lateral aspect of his wound is starting to epithelialize. There  is very minimal slough on the surface. Moisture control is good. 04/20/2023: Continued, albeit extremely slow, encroachment of epithelium as of the wound surface. Minimal slough. Moisture balance is good. Electronic Signature(s) Signed: 04/20/2023 10:37:04 AM By: [name] MD FACS Entered By: [name] on 04/20/2023 07:37:04 -------------------------------------------------------------------------------- Physical Exam Details Patient Name: Date of Service: [name], [name] 04/20/2023 9:30 A M Medical Record Number: [id number] Patient Account Number: [id number] Date of Birth/Sex: Treating RN: 1976-04-26 (47 y.o. M) Primary Care Provider: Dorinda Hill Other Clinician: Referring Provider: Treating Provider/Extender: Lady Gary  [name], [name] ([id number]) [id number].pdf Page 1 of 17 Visit Report for 04/20/2023 Chief Complaint Document Details Patient Name: Date of Service: [name], TO [name] 04/20/2023 9:30 A M Medical Record Number: [id number] Patient Account Number: [id number] Date of Birth/Sex: Treating RN: 05-22-1976 (47 y.o. M) Primary Care Provider: Dorinda Hill Other Clinician: Referring Provider: Treating Provider/Extender: Jana Hakim in Treatment: 144 Information Obtained from: Patient Chief Complaint Bilateral Plantar Foot Ulcers Electronic Signature(s) Signed: 04/20/2023 10:36:05 AM By: [name] MD FACS Entered By: [name] on 04/20/2023 07:36:05 -------------------------------------------------------------------------------- Debridement Details Patient Name: Date of Service: [name], [name] 04/20/2023 9:30 A M Medical Record Number: [id number] Patient Account Number: [id number] Date of Birth/Sex: Treating RN: [birth year] (47 y.o. [name] Primary Care Provider: Dorinda Hill Other Clinician: Referring Provider: Treating Provider/Extender: Jana Hakim in Treatment: 144 Debridement Performed for Assessment: Wound #2 Left Calcaneus Performed By: Physician [name], MD The following information was scribed by: Brenton Grills The information was scribed for: [name] Debridement Type: Debridement Level of Consciousness (Pre-procedure): Awake and Alert Pre-procedure Verification/Time Out Yes - 10:06 Taken: Start Time: 10:07 Pain Control: Lidocaine 4% T opical Solution Percent of Wound Bed Debrided: 100% T Area Debrided (cm): otal 2.2 Tissue and other material debrided: Non-Viable, Slough, Slough Level: Non-Viable Tissue Debridement Description: Selective/Open Wound Instrument: Curette Bleeding: Minimum Hemostasis Achieved: Pressure Response to Treatment: Procedure was tolerated  well Level of Consciousness (Post- Awake and Alert procedure): Post Debridement Measurements of Total Wound Length: (cm) 2.8 Stage: Category/Stage III Width: (cm) 1 Depth: (cm) 0.5 Volume: (cm) 1.1 Character of Wound/Ulcer Post Debridement: Improved Post Procedure Diagnosis [name], [name] ([id number]) [id number] Page 2 of 17 Same as Pre-procedure Electronic Signature(s) Signed: 04/20/2023 12:00:58 PM By: [name] MD FACS Signed: 04/24/2023 12:35:20 PM By: Brenton Grills Entered By: Brenton Grills on 04/20/2023 07:07:27 -------------------------------------------------------------------------------- HPI Details Patient Name: Date of Service: [name], [name] 04/20/2023 9:30 A M Medical Record Number: [id number] Patient Account Number: [id number] Date of Birth/Sex: Treating RN: [birth year] (47 y.o. M) Primary Care Provider: Dorinda Hill Other Clinician: Referring Provider: Treating Provider/Extender: Jana Hakim in Treatment: 144 History of Present Illness HPI Description: Wounds are12/03/2020 upon evaluation today patient presents for initial inspection here in our clinic concerning issues he has been having with the bottoms of his feet bilaterally. He states these actually occurred as wounds when he was hospitalized for 5 months secondary to Covid. He was apparently with tilting bed where he was in an upright position quite frequently and apparently this occurred in some way shape or form during that time. Fortunately there is no sign of active infection at this time. No fevers, chills, nausea, vomiting, or diarrhea. With that being said he still has substantial wounds on the plantar aspects of his feet Theragen require quite a bit of work to get these to heal. He has been using Santyl currently though that is been problematic both in receiving the medication as well as actually paid for it as it is become quite expensive. Prior  to the experience with Covid the patient really did not have any major medical problems other than hypertension he does have some mild generalized weakness following the Covid experience. 07/22/2020 on evaluation today patient appears to be doing okay in regard to his foot ulcers I feel like the wound beds are showing signs of better improvement that I do believe the Iodoflex is helping in this regard. With  particularly at the posterior calcaneal aspect of the wound. There has been a little bit of moisture accumulation with minimal maceration. 01/12/2023: More epithelium has filled in. There is very minimal accumulation of slough. 01/20/2023: The wound is measuring a little bit smaller today. It did measure deeper, but this appears to be secondary to some heaped up senescent skin around the edges. The epithelium that has been present at the posterior aspect of the wound seems to be a bit more robust with greater integrity. 01/26/2023: He reports intense itching to the skin around the wound and there has been some breakdown here. It looks fungal in nature. The wound itself looks pretty good with improved epithelialization with just some slough buildup. He has a little callus along the wound edges. 02/02/2023: He has been treating the periwound skin with an over-the-counter athlete's foot medication and notes significant improvement in his pruritus. The skin looks better here, as well. The wound continues to epithelialize. There is light slough accumulation. 02/10/2023: The periwound skin still looks fairly angry. He reports multiple small blisters that  have opened up. He is not having the intense itching that he had prior, however. There has been more epithelialization at the calcaneus and there is minimal slough accumulation. 02/16/2023: The periwound skin is improving. An online review of dermatology rash images suggest that this may be hand-foot-and-mouth disease, isolated to the feet; the patient says he actually had this as a child. The calcaneus continues to epithelialize and the tissue was quite robust here. Very minimal slough accumulation on the open portion of the wound with a little bit of periwound callus buildup. 03/03/2023: The wound measurements are about the same, but there is more epithelium making its way to the surface. There is a little bit of crust and eschar around the edges and minimal slough on the surface. 03/09/2023: His dressing was applied rather haphazardly and the drawtex was not applied. The Optifoam was also reversed such that the orange side was in contact with his bare skin. He has some maceration around the wound edges, but this did not appear to result in any tissue breakdown. There is a little bit of slough on the surface and minimal eschar around the edges. 03/15/2023: The wound measured narrower today. There is some slough accumulation on the surface and some eschar around the edges. 03/23/2023: The length of the wound remains stable, but it continues to narrow. The skin that has covered the posterior aspect of the calcaneus is more robust and has better tensile integrity. Minimal slough and eschar accumulation. 03/30/2023: The wound length has decreased. This seems to be primarily due to more epithelium covering the posterior aspect of the site. Minimal slough and eschar buildup. 04/07/2023: The wound measured a little bit smaller again today. There is minimal slough accumulation with just a little bit of eschar and callus around the edges. 04/13/2023: The lateral aspect of his wound is starting to epithelialize. There  is very minimal slough on the surface. Moisture control is good. 04/20/2023: Continued, albeit extremely slow, encroachment of epithelium as of the wound surface. Minimal slough. Moisture balance is good. Electronic Signature(s) Signed: 04/20/2023 10:37:04 AM By: [name] MD FACS Entered By: [name] on 04/20/2023 07:37:04 -------------------------------------------------------------------------------- Physical Exam Details Patient Name: Date of Service: [name], [name] 04/20/2023 9:30 A M Medical Record Number: [id number] Patient Account Number: [id number] Date of Birth/Sex: Treating RN: 1976-04-26 (47 y.o. M) Primary Care Provider: Dorinda Hill Other Clinician: Referring Provider: Treating Provider/Extender: Lady Gary  Diabetes: Yes - Father,Paternal Grandparents; Heart Disease: Yes - Maternal Grandparents; Hereditary Spherocytosis: No; Hypertension: Yes - Father,Paternal Grandparents; Kidney Disease: No; Lung Disease: Yes - Siblings; Seizures: No; Stroke: No; Thyroid Problems: No; Tuberculosis: No; Never smoker; Marital Status - Married; Alcohol Use: Never; Drug Use: No History; Caffeine Use: Daily - tea, soda; Financial Concerns: No; Food, Clothing or Shelter Needs: No; Support System Lacking: No; Transportation Concerns: No Electronic Signature(s) Signed: 04/20/2023 12:00:58 PM By: [name] MD FACS Entered By: [name] on 04/20/2023  07:37:15 -------------------------------------------------------------------------------- SuperBill Details Patient Name: Date of Service: [name], [name] 04/20/2023 Medical Record Number: [id number] Patient Account Number: [id number] Date of Birth/Sex: Treating RN: 10-07-1975 (47 y.o. M) Primary Care Provider: Dorinda Hill Other Clinician: Referring Provider: Treating Provider/Extender: Jana Hakim in Treatment: 144 Diagnosis Coding ICD-10 Codes Code Description [phone] Non-pressure chronic ulcer of other part of left foot with fat layer exposed Facility Procedures : CPT4 Code: [id number] Description: [phone] - DEBRIDE WOUND 1ST 20 SQ CM OR < ICD-10 Diagnosis Description L97.522 Non-pressure chronic ulcer of other part of left foot with fat layer exposed Modifier: Quantity: 1 Physician Procedures : CPT4 Code Description Modifier [id number] - WC PHYS LEVEL 4 - EST PT 25 ICD-10 Diagnosis Description L97.522 Non-pressure chronic ulcer of other part of left foot with fat layer exposed Quantity: 1 : [id number] - WC PHYS DEBR WO ANESTH 20 SQ CM ICD-10 Diagnosis Description L97.522 Non-pressure chronic ulcer of other part of left foot with fat layer exposed Quantity: 1 Electronic Signature(s) Signed: 04/20/2023 10:41:07 AM By: [name] MD FACS Entered By: [name] on 04/20/2023 07:41:07  [name], [name] ([id number]) [id number].pdf Page 1 of 17 Visit Report for 04/20/2023 Chief Complaint Document Details Patient Name: Date of Service: [name], TO [name] 04/20/2023 9:30 A M Medical Record Number: [id number] Patient Account Number: [id number] Date of Birth/Sex: Treating RN: 05-22-1976 (47 y.o. M) Primary Care Provider: Dorinda Hill Other Clinician: Referring Provider: Treating Provider/Extender: Jana Hakim in Treatment: 144 Information Obtained from: Patient Chief Complaint Bilateral Plantar Foot Ulcers Electronic Signature(s) Signed: 04/20/2023 10:36:05 AM By: [name] MD FACS Entered By: [name] on 04/20/2023 07:36:05 -------------------------------------------------------------------------------- Debridement Details Patient Name: Date of Service: [name], [name] 04/20/2023 9:30 A M Medical Record Number: [id number] Patient Account Number: [id number] Date of Birth/Sex: Treating RN: [birth year] (47 y.o. [name] Primary Care Provider: Dorinda Hill Other Clinician: Referring Provider: Treating Provider/Extender: Jana Hakim in Treatment: 144 Debridement Performed for Assessment: Wound #2 Left Calcaneus Performed By: Physician [name], MD The following information was scribed by: Brenton Grills The information was scribed for: [name] Debridement Type: Debridement Level of Consciousness (Pre-procedure): Awake and Alert Pre-procedure Verification/Time Out Yes - 10:06 Taken: Start Time: 10:07 Pain Control: Lidocaine 4% T opical Solution Percent of Wound Bed Debrided: 100% T Area Debrided (cm): otal 2.2 Tissue and other material debrided: Non-Viable, Slough, Slough Level: Non-Viable Tissue Debridement Description: Selective/Open Wound Instrument: Curette Bleeding: Minimum Hemostasis Achieved: Pressure Response to Treatment: Procedure was tolerated  well Level of Consciousness (Post- Awake and Alert procedure): Post Debridement Measurements of Total Wound Length: (cm) 2.8 Stage: Category/Stage III Width: (cm) 1 Depth: (cm) 0.5 Volume: (cm) 1.1 Character of Wound/Ulcer Post Debridement: Improved Post Procedure Diagnosis [name], [name] ([id number]) [id number] Page 2 of 17 Same as Pre-procedure Electronic Signature(s) Signed: 04/20/2023 12:00:58 PM By: [name] MD FACS Signed: 04/24/2023 12:35:20 PM By: Brenton Grills Entered By: Brenton Grills on 04/20/2023 07:07:27 -------------------------------------------------------------------------------- HPI Details Patient Name: Date of Service: [name], [name] 04/20/2023 9:30 A M Medical Record Number: [id number] Patient Account Number: [id number] Date of Birth/Sex: Treating RN: [birth year] (47 y.o. M) Primary Care Provider: Dorinda Hill Other Clinician: Referring Provider: Treating Provider/Extender: Jana Hakim in Treatment: 144 History of Present Illness HPI Description: Wounds are12/03/2020 upon evaluation today patient presents for initial inspection here in our clinic concerning issues he has been having with the bottoms of his feet bilaterally. He states these actually occurred as wounds when he was hospitalized for 5 months secondary to Covid. He was apparently with tilting bed where he was in an upright position quite frequently and apparently this occurred in some way shape or form during that time. Fortunately there is no sign of active infection at this time. No fevers, chills, nausea, vomiting, or diarrhea. With that being said he still has substantial wounds on the plantar aspects of his feet Theragen require quite a bit of work to get these to heal. He has been using Santyl currently though that is been problematic both in receiving the medication as well as actually paid for it as it is become quite expensive. Prior  to the experience with Covid the patient really did not have any major medical problems other than hypertension he does have some mild generalized weakness following the Covid experience. 07/22/2020 on evaluation today patient appears to be doing okay in regard to his foot ulcers I feel like the wound beds are showing signs of better improvement that I do believe the Iodoflex is helping in this regard. With  [name], [name] ([id number]) [id number].pdf Page 1 of 17 Visit Report for 04/20/2023 Chief Complaint Document Details Patient Name: Date of Service: [name], TO [name] 04/20/2023 9:30 A M Medical Record Number: [id number] Patient Account Number: [id number] Date of Birth/Sex: Treating RN: 05-22-1976 (47 y.o. M) Primary Care Provider: Dorinda Hill Other Clinician: Referring Provider: Treating Provider/Extender: Jana Hakim in Treatment: 144 Information Obtained from: Patient Chief Complaint Bilateral Plantar Foot Ulcers Electronic Signature(s) Signed: 04/20/2023 10:36:05 AM By: [name] MD FACS Entered By: [name] on 04/20/2023 07:36:05 -------------------------------------------------------------------------------- Debridement Details Patient Name: Date of Service: [name], [name] 04/20/2023 9:30 A M Medical Record Number: [id number] Patient Account Number: [id number] Date of Birth/Sex: Treating RN: [birth year] (47 y.o. [name] Primary Care Provider: Dorinda Hill Other Clinician: Referring Provider: Treating Provider/Extender: Jana Hakim in Treatment: 144 Debridement Performed for Assessment: Wound #2 Left Calcaneus Performed By: Physician [name], MD The following information was scribed by: Brenton Grills The information was scribed for: [name] Debridement Type: Debridement Level of Consciousness (Pre-procedure): Awake and Alert Pre-procedure Verification/Time Out Yes - 10:06 Taken: Start Time: 10:07 Pain Control: Lidocaine 4% T opical Solution Percent of Wound Bed Debrided: 100% T Area Debrided (cm): otal 2.2 Tissue and other material debrided: Non-Viable, Slough, Slough Level: Non-Viable Tissue Debridement Description: Selective/Open Wound Instrument: Curette Bleeding: Minimum Hemostasis Achieved: Pressure Response to Treatment: Procedure was tolerated  well Level of Consciousness (Post- Awake and Alert procedure): Post Debridement Measurements of Total Wound Length: (cm) 2.8 Stage: Category/Stage III Width: (cm) 1 Depth: (cm) 0.5 Volume: (cm) 1.1 Character of Wound/Ulcer Post Debridement: Improved Post Procedure Diagnosis [name], [name] ([id number]) [id number] Page 2 of 17 Same as Pre-procedure Electronic Signature(s) Signed: 04/20/2023 12:00:58 PM By: [name] MD FACS Signed: 04/24/2023 12:35:20 PM By: Brenton Grills Entered By: Brenton Grills on 04/20/2023 07:07:27 -------------------------------------------------------------------------------- HPI Details Patient Name: Date of Service: [name], [name] 04/20/2023 9:30 A M Medical Record Number: [id number] Patient Account Number: [id number] Date of Birth/Sex: Treating RN: [birth year] (47 y.o. M) Primary Care Provider: Dorinda Hill Other Clinician: Referring Provider: Treating Provider/Extender: Jana Hakim in Treatment: 144 History of Present Illness HPI Description: Wounds are12/03/2020 upon evaluation today patient presents for initial inspection here in our clinic concerning issues he has been having with the bottoms of his feet bilaterally. He states these actually occurred as wounds when he was hospitalized for 5 months secondary to Covid. He was apparently with tilting bed where he was in an upright position quite frequently and apparently this occurred in some way shape or form during that time. Fortunately there is no sign of active infection at this time. No fevers, chills, nausea, vomiting, or diarrhea. With that being said he still has substantial wounds on the plantar aspects of his feet Theragen require quite a bit of work to get these to heal. He has been using Santyl currently though that is been problematic both in receiving the medication as well as actually paid for it as it is become quite expensive. Prior  to the experience with Covid the patient really did not have any major medical problems other than hypertension he does have some mild generalized weakness following the Covid experience. 07/22/2020 on evaluation today patient appears to be doing okay in regard to his foot ulcers I feel like the wound beds are showing signs of better improvement that I do believe the Iodoflex is helping in this regard. With  Diabetes: Yes - Father,Paternal Grandparents; Heart Disease: Yes - Maternal Grandparents; Hereditary Spherocytosis: No; Hypertension: Yes - Father,Paternal Grandparents; Kidney Disease: No; Lung Disease: Yes - Siblings; Seizures: No; Stroke: No; Thyroid Problems: No; Tuberculosis: No; Never smoker; Marital Status - Married; Alcohol Use: Never; Drug Use: No History; Caffeine Use: Daily - tea, soda; Financial Concerns: No; Food, Clothing or Shelter Needs: No; Support System Lacking: No; Transportation Concerns: No Electronic Signature(s) Signed: 04/20/2023 12:00:58 PM By: [name] MD FACS Entered By: [name] on 04/20/2023  07:37:15 -------------------------------------------------------------------------------- SuperBill Details Patient Name: Date of Service: [name], [name] 04/20/2023 Medical Record Number: [id number] Patient Account Number: [id number] Date of Birth/Sex: Treating RN: 10-07-1975 (47 y.o. M) Primary Care Provider: Dorinda Hill Other Clinician: Referring Provider: Treating Provider/Extender: Jana Hakim in Treatment: 144 Diagnosis Coding ICD-10 Codes Code Description [phone] Non-pressure chronic ulcer of other part of left foot with fat layer exposed Facility Procedures : CPT4 Code: [id number] Description: [phone] - DEBRIDE WOUND 1ST 20 SQ CM OR < ICD-10 Diagnosis Description L97.522 Non-pressure chronic ulcer of other part of left foot with fat layer exposed Modifier: Quantity: 1 Physician Procedures : CPT4 Code Description Modifier [id number] - WC PHYS LEVEL 4 - EST PT 25 ICD-10 Diagnosis Description L97.522 Non-pressure chronic ulcer of other part of left foot with fat layer exposed Quantity: 1 : [id number] - WC PHYS DEBR WO ANESTH 20 SQ CM ICD-10 Diagnosis Description L97.522 Non-pressure chronic ulcer of other part of left foot with fat layer exposed Quantity: 1 Electronic Signature(s) Signed: 04/20/2023 10:41:07 AM By: [name] MD FACS Entered By: [name] on 04/20/2023 07:41:07  particularly at the posterior calcaneal aspect of the wound. There has been a little bit of moisture accumulation with minimal maceration. 01/12/2023: More epithelium has filled in. There is very minimal accumulation of slough. 01/20/2023: The wound is measuring a little bit smaller today. It did measure deeper, but this appears to be secondary to some heaped up senescent skin around the edges. The epithelium that has been present at the posterior aspect of the wound seems to be a bit more robust with greater integrity. 01/26/2023: He reports intense itching to the skin around the wound and there has been some breakdown here. It looks fungal in nature. The wound itself looks pretty good with improved epithelialization with just some slough buildup. He has a little callus along the wound edges. 02/02/2023: He has been treating the periwound skin with an over-the-counter athlete's foot medication and notes significant improvement in his pruritus. The skin looks better here, as well. The wound continues to epithelialize. There is light slough accumulation. 02/10/2023: The periwound skin still looks fairly angry. He reports multiple small blisters that  have opened up. He is not having the intense itching that he had prior, however. There has been more epithelialization at the calcaneus and there is minimal slough accumulation. 02/16/2023: The periwound skin is improving. An online review of dermatology rash images suggest that this may be hand-foot-and-mouth disease, isolated to the feet; the patient says he actually had this as a child. The calcaneus continues to epithelialize and the tissue was quite robust here. Very minimal slough accumulation on the open portion of the wound with a little bit of periwound callus buildup. 03/03/2023: The wound measurements are about the same, but there is more epithelium making its way to the surface. There is a little bit of crust and eschar around the edges and minimal slough on the surface. 03/09/2023: His dressing was applied rather haphazardly and the drawtex was not applied. The Optifoam was also reversed such that the orange side was in contact with his bare skin. He has some maceration around the wound edges, but this did not appear to result in any tissue breakdown. There is a little bit of slough on the surface and minimal eschar around the edges. 03/15/2023: The wound measured narrower today. There is some slough accumulation on the surface and some eschar around the edges. 03/23/2023: The length of the wound remains stable, but it continues to narrow. The skin that has covered the posterior aspect of the calcaneus is more robust and has better tensile integrity. Minimal slough and eschar accumulation. 03/30/2023: The wound length has decreased. This seems to be primarily due to more epithelium covering the posterior aspect of the site. Minimal slough and eschar buildup. 04/07/2023: The wound measured a little bit smaller again today. There is minimal slough accumulation with just a little bit of eschar and callus around the edges. 04/13/2023: The lateral aspect of his wound is starting to epithelialize. There  is very minimal slough on the surface. Moisture control is good. 04/20/2023: Continued, albeit extremely slow, encroachment of epithelium as of the wound surface. Minimal slough. Moisture balance is good. Electronic Signature(s) Signed: 04/20/2023 10:37:04 AM By: [name] MD FACS Entered By: [name] on 04/20/2023 07:37:04 -------------------------------------------------------------------------------- Physical Exam Details Patient Name: Date of Service: [name], [name] 04/20/2023 9:30 A M Medical Record Number: [id number] Patient Account Number: [id number] Date of Birth/Sex: Treating RN: 1976-04-26 (47 y.o. M) Primary Care Provider: Dorinda Hill Other Clinician: Referring Provider: Treating Provider/Extender: Lady Gary  [name], [name] ([id number]) [id number].pdf Page 1 of 17 Visit Report for 04/20/2023 Chief Complaint Document Details Patient Name: Date of Service: [name], TO [name] 04/20/2023 9:30 A M Medical Record Number: [id number] Patient Account Number: [id number] Date of Birth/Sex: Treating RN: 05-22-1976 (47 y.o. M) Primary Care Provider: Dorinda Hill Other Clinician: Referring Provider: Treating Provider/Extender: Jana Hakim in Treatment: 144 Information Obtained from: Patient Chief Complaint Bilateral Plantar Foot Ulcers Electronic Signature(s) Signed: 04/20/2023 10:36:05 AM By: [name] MD FACS Entered By: [name] on 04/20/2023 07:36:05 -------------------------------------------------------------------------------- Debridement Details Patient Name: Date of Service: [name], [name] 04/20/2023 9:30 A M Medical Record Number: [id number] Patient Account Number: [id number] Date of Birth/Sex: Treating RN: [birth year] (47 y.o. [name] Primary Care Provider: Dorinda Hill Other Clinician: Referring Provider: Treating Provider/Extender: Jana Hakim in Treatment: 144 Debridement Performed for Assessment: Wound #2 Left Calcaneus Performed By: Physician [name], MD The following information was scribed by: Brenton Grills The information was scribed for: [name] Debridement Type: Debridement Level of Consciousness (Pre-procedure): Awake and Alert Pre-procedure Verification/Time Out Yes - 10:06 Taken: Start Time: 10:07 Pain Control: Lidocaine 4% T opical Solution Percent of Wound Bed Debrided: 100% T Area Debrided (cm): otal 2.2 Tissue and other material debrided: Non-Viable, Slough, Slough Level: Non-Viable Tissue Debridement Description: Selective/Open Wound Instrument: Curette Bleeding: Minimum Hemostasis Achieved: Pressure Response to Treatment: Procedure was tolerated  well Level of Consciousness (Post- Awake and Alert procedure): Post Debridement Measurements of Total Wound Length: (cm) 2.8 Stage: Category/Stage III Width: (cm) 1 Depth: (cm) 0.5 Volume: (cm) 1.1 Character of Wound/Ulcer Post Debridement: Improved Post Procedure Diagnosis [name], [name] ([id number]) [id number] Page 2 of 17 Same as Pre-procedure Electronic Signature(s) Signed: 04/20/2023 12:00:58 PM By: [name] MD FACS Signed: 04/24/2023 12:35:20 PM By: Brenton Grills Entered By: Brenton Grills on 04/20/2023 07:07:27 -------------------------------------------------------------------------------- HPI Details Patient Name: Date of Service: [name], [name] 04/20/2023 9:30 A M Medical Record Number: [id number] Patient Account Number: [id number] Date of Birth/Sex: Treating RN: [birth year] (47 y.o. M) Primary Care Provider: Dorinda Hill Other Clinician: Referring Provider: Treating Provider/Extender: Jana Hakim in Treatment: 144 History of Present Illness HPI Description: Wounds are12/03/2020 upon evaluation today patient presents for initial inspection here in our clinic concerning issues he has been having with the bottoms of his feet bilaterally. He states these actually occurred as wounds when he was hospitalized for 5 months secondary to Covid. He was apparently with tilting bed where he was in an upright position quite frequently and apparently this occurred in some way shape or form during that time. Fortunately there is no sign of active infection at this time. No fevers, chills, nausea, vomiting, or diarrhea. With that being said he still has substantial wounds on the plantar aspects of his feet Theragen require quite a bit of work to get these to heal. He has been using Santyl currently though that is been problematic both in receiving the medication as well as actually paid for it as it is become quite expensive. Prior  to the experience with Covid the patient really did not have any major medical problems other than hypertension he does have some mild generalized weakness following the Covid experience. 07/22/2020 on evaluation today patient appears to be doing okay in regard to his foot ulcers I feel like the wound beds are showing signs of better improvement that I do believe the Iodoflex is helping in this regard. With  particularly at the posterior calcaneal aspect of the wound. There has been a little bit of moisture accumulation with minimal maceration. 01/12/2023: More epithelium has filled in. There is very minimal accumulation of slough. 01/20/2023: The wound is measuring a little bit smaller today. It did measure deeper, but this appears to be secondary to some heaped up senescent skin around the edges. The epithelium that has been present at the posterior aspect of the wound seems to be a bit more robust with greater integrity. 01/26/2023: He reports intense itching to the skin around the wound and there has been some breakdown here. It looks fungal in nature. The wound itself looks pretty good with improved epithelialization with just some slough buildup. He has a little callus along the wound edges. 02/02/2023: He has been treating the periwound skin with an over-the-counter athlete's foot medication and notes significant improvement in his pruritus. The skin looks better here, as well. The wound continues to epithelialize. There is light slough accumulation. 02/10/2023: The periwound skin still looks fairly angry. He reports multiple small blisters that  have opened up. He is not having the intense itching that he had prior, however. There has been more epithelialization at the calcaneus and there is minimal slough accumulation. 02/16/2023: The periwound skin is improving. An online review of dermatology rash images suggest that this may be hand-foot-and-mouth disease, isolated to the feet; the patient says he actually had this as a child. The calcaneus continues to epithelialize and the tissue was quite robust here. Very minimal slough accumulation on the open portion of the wound with a little bit of periwound callus buildup. 03/03/2023: The wound measurements are about the same, but there is more epithelium making its way to the surface. There is a little bit of crust and eschar around the edges and minimal slough on the surface. 03/09/2023: His dressing was applied rather haphazardly and the drawtex was not applied. The Optifoam was also reversed such that the orange side was in contact with his bare skin. He has some maceration around the wound edges, but this did not appear to result in any tissue breakdown. There is a little bit of slough on the surface and minimal eschar around the edges. 03/15/2023: The wound measured narrower today. There is some slough accumulation on the surface and some eschar around the edges. 03/23/2023: The length of the wound remains stable, but it continues to narrow. The skin that has covered the posterior aspect of the calcaneus is more robust and has better tensile integrity. Minimal slough and eschar accumulation. 03/30/2023: The wound length has decreased. This seems to be primarily due to more epithelium covering the posterior aspect of the site. Minimal slough and eschar buildup. 04/07/2023: The wound measured a little bit smaller again today. There is minimal slough accumulation with just a little bit of eschar and callus around the edges. 04/13/2023: The lateral aspect of his wound is starting to epithelialize. There  is very minimal slough on the surface. Moisture control is good. 04/20/2023: Continued, albeit extremely slow, encroachment of epithelium as of the wound surface. Minimal slough. Moisture balance is good. Electronic Signature(s) Signed: 04/20/2023 10:37:04 AM By: [name] MD FACS Entered By: [name] on 04/20/2023 07:37:04 -------------------------------------------------------------------------------- Physical Exam Details Patient Name: Date of Service: [name], [name] 04/20/2023 9:30 A M Medical Record Number: [id number] Patient Account Number: [id number] Date of Birth/Sex: Treating RN: 1976-04-26 (47 y.o. M) Primary Care Provider: Dorinda Hill Other Clinician: Referring Provider: Treating Provider/Extender: Lady Gary  particularly at the posterior calcaneal aspect of the wound. There has been a little bit of moisture accumulation with minimal maceration. 01/12/2023: More epithelium has filled in. There is very minimal accumulation of slough. 01/20/2023: The wound is measuring a little bit smaller today. It did measure deeper, but this appears to be secondary to some heaped up senescent skin around the edges. The epithelium that has been present at the posterior aspect of the wound seems to be a bit more robust with greater integrity. 01/26/2023: He reports intense itching to the skin around the wound and there has been some breakdown here. It looks fungal in nature. The wound itself looks pretty good with improved epithelialization with just some slough buildup. He has a little callus along the wound edges. 02/02/2023: He has been treating the periwound skin with an over-the-counter athlete's foot medication and notes significant improvement in his pruritus. The skin looks better here, as well. The wound continues to epithelialize. There is light slough accumulation. 02/10/2023: The periwound skin still looks fairly angry. He reports multiple small blisters that  have opened up. He is not having the intense itching that he had prior, however. There has been more epithelialization at the calcaneus and there is minimal slough accumulation. 02/16/2023: The periwound skin is improving. An online review of dermatology rash images suggest that this may be hand-foot-and-mouth disease, isolated to the feet; the patient says he actually had this as a child. The calcaneus continues to epithelialize and the tissue was quite robust here. Very minimal slough accumulation on the open portion of the wound with a little bit of periwound callus buildup. 03/03/2023: The wound measurements are about the same, but there is more epithelium making its way to the surface. There is a little bit of crust and eschar around the edges and minimal slough on the surface. 03/09/2023: His dressing was applied rather haphazardly and the drawtex was not applied. The Optifoam was also reversed such that the orange side was in contact with his bare skin. He has some maceration around the wound edges, but this did not appear to result in any tissue breakdown. There is a little bit of slough on the surface and minimal eschar around the edges. 03/15/2023: The wound measured narrower today. There is some slough accumulation on the surface and some eschar around the edges. 03/23/2023: The length of the wound remains stable, but it continues to narrow. The skin that has covered the posterior aspect of the calcaneus is more robust and has better tensile integrity. Minimal slough and eschar accumulation. 03/30/2023: The wound length has decreased. This seems to be primarily due to more epithelium covering the posterior aspect of the site. Minimal slough and eschar buildup. 04/07/2023: The wound measured a little bit smaller again today. There is minimal slough accumulation with just a little bit of eschar and callus around the edges. 04/13/2023: The lateral aspect of his wound is starting to epithelialize. There  is very minimal slough on the surface. Moisture control is good. 04/20/2023: Continued, albeit extremely slow, encroachment of epithelium as of the wound surface. Minimal slough. Moisture balance is good. Electronic Signature(s) Signed: 04/20/2023 10:37:04 AM By: [name] MD FACS Entered By: [name] on 04/20/2023 07:37:04 -------------------------------------------------------------------------------- Physical Exam Details Patient Name: Date of Service: [name], [name] 04/20/2023 9:30 A M Medical Record Number: [id number] Patient Account Number: [id number] Date of Birth/Sex: Treating RN: 1976-04-26 (47 y.o. M) Primary Care Provider: Dorinda Hill Other Clinician: Referring Provider: Treating Provider/Extender: Lady Gary

## 2023-04-26 DIAGNOSIS — Z125 Encounter for screening for malignant neoplasm of prostate: Secondary | ICD-10-CM | POA: Diagnosis not present

## 2023-04-26 DIAGNOSIS — E785 Hyperlipidemia, unspecified: Secondary | ICD-10-CM | POA: Diagnosis not present

## 2023-04-26 DIAGNOSIS — E559 Vitamin D deficiency, unspecified: Secondary | ICD-10-CM | POA: Diagnosis not present

## 2023-04-26 DIAGNOSIS — Z79899 Other long term (current) drug therapy: Secondary | ICD-10-CM | POA: Diagnosis not present

## 2023-04-26 DIAGNOSIS — I10 Essential (primary) hypertension: Secondary | ICD-10-CM | POA: Diagnosis not present

## 2023-04-27 ENCOUNTER — Encounter (HOSPITAL_BASED_OUTPATIENT_CLINIC_OR_DEPARTMENT_OTHER): Payer: PPO | Admitting: General Surgery

## 2023-04-27 DIAGNOSIS — L89623 Pressure ulcer of left heel, stage 3: Secondary | ICD-10-CM | POA: Diagnosis not present

## 2023-04-27 DIAGNOSIS — L97522 Non-pressure chronic ulcer of other part of left foot with fat layer exposed: Secondary | ICD-10-CM | POA: Diagnosis not present

## 2023-04-27 NOTE — Progress Notes (Signed)
4:20:28 PM By: Isabelle Course By: Brenton Grills on 04/27/2023 11:02:53 -------------------------------------------------------------------------------- Patient/Caregiver Education Details Patient Name: Date of Service: [name], [name] 9/19/2024andnbsp11:00 A M Medical Record Number: [id number] Patient Account Number: [id number] Date of Birth/Gender: Treating RN: [birth year] (47 y.o. [name] Primary Care Physician: Dorinda Hill Other  Clinician: Referring Physician: Treating Physician/Extender: Jana Hakim in Treatment: 145 Education Assessment Education Provided To: Patient and Caregiver Education Topics Provided Wound/Skin Impairment: Methods: Explain/Verbal Responses: State content correctly Electronic Signature(s) Signed: 04/27/2023 4:20:28 PM By: Brenton Grills Entered By: Brenton Grills on 04/27/2023 11:12:46 -------------------------------------------------------------------------------- Wound Assessment Details Patient Name: Date of Service: [name], TO [name] 04/27/2023 11:00 A M Medical Record Number: [id number] Patient Account Number: [id number] Date of Birth/Sex: Treating RN: [birth year] (47 y.o. Zachry, Donisi, Henrietta Dine ([id number]) [id number] Page 6 of 9 Primary Care Dustine Stickler: Dorinda Hill Other Clinician: Referring Keia Rask: Treating Xiao Graul/Extender: Jana Hakim in Treatment: 145 Wound Status Wound Number: 2 Primary Etiology: Pressure Ulcer Wound Location: Left Calcaneus Wound Status: Open Wounding Event: Pressure Injury Comorbid History: Asthma, Angina, Hypertension Date Acquired: 10/07/2019 Weeks Of Treatment: 145 Clustered Wound: No Photos Wound Measurements Length: (cm) 2.6 Width: (cm) 1 Depth: (cm) 0.2 Area: (cm) 2.042 Volume: (cm) 0.408 % Reduction in Area: 92.5% % Reduction in Volume: 98.5% Epithelialization: Small (1-33%) Tunneling: No Undermining: No Wound Description Classification: Category/Stage III Wound Margin: Distinct, outline attached Exudate Amount: Medium Exudate Type: Serosanguineous Exudate Color: red, brown Foul Odor After Cleansing: No Slough/Fibrino No Wound Bed Granulation Amount: Large (67-100%) Exposed Structure Granulation Quality: Pink Fascia Exposed: No Necrotic Amount: Small (1-33%) Fat Layer (Subcutaneous Tissue) Exposed: Yes Necrotic Quality: Adherent  Slough Tendon Exposed: No Muscle Exposed: No Joint Exposed: No Bone Exposed: No Periwound Skin Texture Texture Color No Abnormalities Noted: No No Abnormalities Noted: No Callus: No Atrophie Blanche: No Crepitus: No Cyanosis: No Excoriation: No Ecchymosis: No Induration: No Erythema: No Rash: No Hemosiderin Staining: No Scarring: Yes Mottled: No Pallor: No Moisture Rubor: No No Abnormalities Noted: Yes Temperature / Pain Temperature: No Abnormality Tenderness on Palpation: Yes Treatment Notes Wound #2 (Calcaneus) Wound Laterality: Left Cleanser Normal Saline Discharge Instruction: Cleanse the wound with Normal Saline prior to applying a clean dressing using gauze sponges, not tissue or cotton balls. Wound Cleanser Discharge Instruction: Cleanse the wound with wound cleanser prior to applying a clean dressing using gauze sponges, not tissue or cotton balls. ISMAILA, PELSTER ([id number]) [id number] Page 7 of 9 Peri-Wound Care Ketoconazole Cream 2% Discharge Instruction: Apply Ketoconazole mixed with zinc to the periwound Zinc Oxide Ointment 30g tube Discharge Instruction: Apply Zinc Oxide to periwound with each dressing change Topical Gentamicin Discharge Instruction: apply to the wound bed Primary Dressing Maxorb Extra Ag+ Alginate Dressing, 4x4.75 (in/in) Discharge Instruction: Apply to wound bed as instructed Secondary Dressing Drawtex 4x4 in Discharge Instruction: Apply over primary dressing as directed. Optifoam Non-Adhesive Dressing, 4x4 in Discharge Instruction: Apply over primary dressing as directed. Woven Gauze Sponge, Non-Sterile 4x4 in Discharge Instruction: Apply over primary dressing as directed. Secured With 62M Medipore H Soft Cloth Surgical T ape, 4 x 10 (in/yd) Discharge Instruction: Secure with tape as directed. Compression Wrap Kerlix Roll 4.5x3.1 (in/yd) Discharge Instruction: Apply Kerlix and Coban compression as  directed. Compression Stockings Add-Ons [employer] Offloading Boot, Size Regular Electronic Signature(s) Signed: 04/27/2023 4:20:28 PM By: Brenton Grills Entered By: Brenton Grills on 04/27/2023 11:09:48 -------------------------------------------------------------------------------- Wound Assessment Details Patient Name: Date of Service: [name],  Category/Stage II N/A Classification: Medium Medium N/A Exudate A mount: Serosanguineous Serosanguineous N/A Exudate Type: red, brown red, brown N/A Exudate Color: Distinct, outline attached Distinct, outline attached N/A Wound Margin: Large (67-100%) Medium (34-66%) N/A Granulation A mount: Pink Red, Pink N/A Granulation Quality: Small (1-33%) Medium (34-66%) N/A Necrotic A mount: Adherent Slough Eschar, Adherent Slough N/A Necrotic Tissue: Fat Layer (Subcutaneous Tissue): Yes Fat Layer (Subcutaneous Tissue): Yes N/A Exposed Structures: Fascia: No Tendon: No Muscle: No Joint: No Bone: No Small (1-33%) N/A N/A Epithelialization: Debridement - Selective/Open Wound N/A N/A Debridement: Pre-procedure Verification/Time Out 11:20 N/A N/A Taken: Lidocaine 4% T opical Solution N/A N/A Pain Control: Callus, Slough N/A N/A Tissue Debrided: Non-Viable Tissue N/A N/A Level: 2.04 N/A N/A Debridement A (sq cm): rea Curette N/A N/A Instrument: Minimum N/A N/A Bleeding: Pressure N/A N/A Hemostasis A chieved: Procedure was tolerated well N/A N/A Debridement Treatment Response: 2.6x1x0.2 N/A N/A Post Debridement Measurements L x W x D  (cm) 0.408 N/A N/A Post Debridement Volume: (cm) Category/Stage III N/A N/A Post Debridement Stage: Scarring: Yes Rash: Yes N/A Periwound Skin Texture: Excoriation: No Induration: No Callus: No Crepitus: No Rash: No Dry/Scaly: Yes Dry/Scaly: Yes N/A Periwound Skin Moisture: Maceration: No Atrophie Blanche: No Hemosiderin Staining: Yes N/A Periwound Skin Color: Cyanosis: No Ecchymosis: No Erythema: No Hemosiderin Staining: No Mottled: No Pallor: No Rubor: No No Abnormality No Abnormality N/A Temperature: Yes N/A N/A Tenderness on Palpation: Debridement N/A N/A Procedures Performed: Treatment Notes Electronic Signature(s) Signed: 04/27/2023 11:28:21 AM By: [name] MD FACS Entered By: [name] on 04/27/2023 11:28:21 [name] ([id number]) [id number] Page 4 of 9 -------------------------------------------------------------------------------- Multi-Disciplinary Care Plan Details Patient Name: Date of Service: [name], TO [name] 04/27/2023 11:00 A M Medical Record Number: [id number] Patient Account Number: [id number] Date of Birth/Sex: Treating RN: 10-Feb-1976 (47 y.o. [name] Primary Care Anquan Azzarello: Dorinda Hill Other Clinician: Referring Rumi Taras: Treating Saloma Cadena/Extender: Jana Hakim in Treatment: 145 Multidisciplinary Care Plan reviewed with physician Active Inactive Wound/Skin Impairment Nursing Diagnoses: Impaired tissue integrity Knowledge deficit related to ulceration/compromised skin integrity Goals: Patient/caregiver will verbalize understanding of skin care regimen Date Initiated: 07/15/2020 Target Resolution Date: 05/08/2023 Goal Status: Active Ulcer/skin breakdown will have a volume reduction of 30% by week 4 Date Initiated: 07/15/2020 Date Inactivated: 08/20/2020 Target Resolution Date: 09/03/2020 Goal Status: Unmet Unmet Reason: no major changes. Ulcer/skin breakdown  will heal within 14 weeks Date Initiated: 12/04/2020 Date Inactivated: 12/10/2020 Target Resolution Date: 12/10/2020 Unmet Reason: wounds still open at 14 Goal Status: Unmet weeks and today 21 weeks. Interventions: Assess patient/caregiver ability to obtain necessary supplies Assess patient/caregiver ability to perform ulcer/skin care regimen upon admission and as needed Assess ulceration(s) every visit Provide education on ulcer and skin care Treatment Activities: Skin care regimen initiated : 07/15/2020 Topical wound management initiated : 07/15/2020 Notes: Electronic Signature(s) Signed: 04/27/2023 4:20:28 PM By: Brenton Grills Entered By: Brenton Grills on 04/27/2023 11:10:35 -------------------------------------------------------------------------------- Pain Assessment Details Patient Name: Date of Service: [name], [name] 04/27/2023 11:00 A M Medical Record Number: [id number] Patient Account Number: [id number] Date of Birth/Sex: Treating RN: 1975/11/15 (47 y.o. [name] Primary Care Porsha Skilton: Dorinda Hill Other Clinician: Referring Alroy Portela: Treating Miho Monda/Extender: Jana Hakim in Treatment: 145 Active Problems Location of Pain Severity and Description of Pain Patient Has Paino No BOGART, WINZELER ([id number]) [id number] Page 5 of 9 Patient Has Paino No Site Locations Pain Management and Medication Current Pain Management: Electronic Signature(s) Signed: 04/27/2023  Category/Stage II N/A Classification: Medium Medium N/A Exudate A mount: Serosanguineous Serosanguineous N/A Exudate Type: red, brown red, brown N/A Exudate Color: Distinct, outline attached Distinct, outline attached N/A Wound Margin: Large (67-100%) Medium (34-66%) N/A Granulation A mount: Pink Red, Pink N/A Granulation Quality: Small (1-33%) Medium (34-66%) N/A Necrotic A mount: Adherent Slough Eschar, Adherent Slough N/A Necrotic Tissue: Fat Layer (Subcutaneous Tissue): Yes Fat Layer (Subcutaneous Tissue): Yes N/A Exposed Structures: Fascia: No Tendon: No Muscle: No Joint: No Bone: No Small (1-33%) N/A N/A Epithelialization: Debridement - Selective/Open Wound N/A N/A Debridement: Pre-procedure Verification/Time Out 11:20 N/A N/A Taken: Lidocaine 4% T opical Solution N/A N/A Pain Control: Callus, Slough N/A N/A Tissue Debrided: Non-Viable Tissue N/A N/A Level: 2.04 N/A N/A Debridement A (sq cm): rea Curette N/A N/A Instrument: Minimum N/A N/A Bleeding: Pressure N/A N/A Hemostasis A chieved: Procedure was tolerated well N/A N/A Debridement Treatment Response: 2.6x1x0.2 N/A N/A Post Debridement Measurements L x W x D  (cm) 0.408 N/A N/A Post Debridement Volume: (cm) Category/Stage III N/A N/A Post Debridement Stage: Scarring: Yes Rash: Yes N/A Periwound Skin Texture: Excoriation: No Induration: No Callus: No Crepitus: No Rash: No Dry/Scaly: Yes Dry/Scaly: Yes N/A Periwound Skin Moisture: Maceration: No Atrophie Blanche: No Hemosiderin Staining: Yes N/A Periwound Skin Color: Cyanosis: No Ecchymosis: No Erythema: No Hemosiderin Staining: No Mottled: No Pallor: No Rubor: No No Abnormality No Abnormality N/A Temperature: Yes N/A N/A Tenderness on Palpation: Debridement N/A N/A Procedures Performed: Treatment Notes Electronic Signature(s) Signed: 04/27/2023 11:28:21 AM By: [name] MD FACS Entered By: [name] on 04/27/2023 11:28:21 [name] ([id number]) [id number] Page 4 of 9 -------------------------------------------------------------------------------- Multi-Disciplinary Care Plan Details Patient Name: Date of Service: [name], TO [name] 04/27/2023 11:00 A M Medical Record Number: [id number] Patient Account Number: [id number] Date of Birth/Sex: Treating RN: 10-Feb-1976 (47 y.o. [name] Primary Care Anquan Azzarello: Dorinda Hill Other Clinician: Referring Rumi Taras: Treating Saloma Cadena/Extender: Jana Hakim in Treatment: 145 Multidisciplinary Care Plan reviewed with physician Active Inactive Wound/Skin Impairment Nursing Diagnoses: Impaired tissue integrity Knowledge deficit related to ulceration/compromised skin integrity Goals: Patient/caregiver will verbalize understanding of skin care regimen Date Initiated: 07/15/2020 Target Resolution Date: 05/08/2023 Goal Status: Active Ulcer/skin breakdown will have a volume reduction of 30% by week 4 Date Initiated: 07/15/2020 Date Inactivated: 08/20/2020 Target Resolution Date: 09/03/2020 Goal Status: Unmet Unmet Reason: no major changes. Ulcer/skin breakdown  will heal within 14 weeks Date Initiated: 12/04/2020 Date Inactivated: 12/10/2020 Target Resolution Date: 12/10/2020 Unmet Reason: wounds still open at 14 Goal Status: Unmet weeks and today 21 weeks. Interventions: Assess patient/caregiver ability to obtain necessary supplies Assess patient/caregiver ability to perform ulcer/skin care regimen upon admission and as needed Assess ulceration(s) every visit Provide education on ulcer and skin care Treatment Activities: Skin care regimen initiated : 07/15/2020 Topical wound management initiated : 07/15/2020 Notes: Electronic Signature(s) Signed: 04/27/2023 4:20:28 PM By: Brenton Grills Entered By: Brenton Grills on 04/27/2023 11:10:35 -------------------------------------------------------------------------------- Pain Assessment Details Patient Name: Date of Service: [name], [name] 04/27/2023 11:00 A M Medical Record Number: [id number] Patient Account Number: [id number] Date of Birth/Sex: Treating RN: 1975/11/15 (47 y.o. [name] Primary Care Porsha Skilton: Dorinda Hill Other Clinician: Referring Alroy Portela: Treating Miho Monda/Extender: Jana Hakim in Treatment: 145 Active Problems Location of Pain Severity and Description of Pain Patient Has Paino No BOGART, WINZELER ([id number]) [id number] Page 5 of 9 Patient Has Paino No Site Locations Pain Management and Medication Current Pain Management: Electronic Signature(s) Signed: 04/27/2023  TO [name] 04/27/2023 11:00 A M Medical Record Number: [id number] Patient Account Number: [id number] Date of Birth/Sex: Treating RN: October 14, 1975 (46 y.o. [name] Primary Care Kallyn Demarcus: Dorinda Hill Other Clinician: Referring Braydee Shimkus: Treating Preeti Winegardner/Extender: Jana Hakim in Treatment: 145 Wound Status Wound Number: 5 Primary Etiology: Pressure Ulcer Wound Location: Right, Plantar Foot Wound Status: Open Wounding Event: Gradually Appeared Comorbid History: Asthma, Angina, Hypertension Date Acquired: 04/27/2023 Weeks Of Treatment: 0 Clustered Wound: No Photos GIONNI, VILLAR ([id number]) [id number] Page 8 of 9 Wound Measurements Length: (cm) 0.2 Width: (cm) 0.2 Depth: (cm) 0.1 Area: (cm) 0.031 Volume: (cm) 0.003 % Reduction in Area: % Reduction in Volume: Tunneling: No Undermining: No Wound Description Classification: Category/Stage II Wound Margin: Distinct, outline attached Exudate Amount: Medium Exudate Type: Serosanguineous Exudate Color: red, brown Foul Odor After Cleansing: No Slough/Fibrino Yes Wound Bed Granulation Amount: Medium (34-66%) Exposed Structure Granulation Quality: Red, Pink Fat Layer (Subcutaneous Tissue) Exposed: Yes Necrotic Amount: Medium (34-66%) Necrotic Quality: Eschar, Adherent Slough Periwound Skin Texture Texture Color No Abnormalities Noted: No No Abnormalities Noted: No Rash: Yes Hemosiderin Staining: Yes Moisture Temperature / Pain No Abnormalities Noted: No Temperature: No Abnormality Dry / Scaly: Yes Treatment  Notes Wound #5 (Foot) Wound Laterality: Plantar, Right Cleanser Peri-Wound Care Topical Primary Dressing Secondary Dressing Secured With Compression Wrap Compression Stockings Add-Ons Electronic Signature(s) Signed: 04/27/2023 4:20:28 PM By: Brenton Grills Entered By: Brenton Grills on 04/27/2023 11:28:25 Vitals Details -------------------------------------------------------------------------------- [name] ([id number]) [id number] Page 9 of 9 Patient Name: Date of Service: [name], TO [name] 04/27/2023 11:00 A M Medical Record Number: [id number] Patient Account Number: [id number] Date of Birth/Sex: Treating RN: 06/02/1976 (47 y.o. [name] Primary Care Radames Mejorado: Dorinda Hill Other Clinician: Referring Elyza Whitt: Treating Estha Few/Extender: Jana Hakim in Treatment: 145 Vital Signs Time Taken: 11:02 Temperature (F): 98.2 Height (in): 69 Pulse (bpm): 87 Weight (lbs): 280 Respiratory Rate (breaths/min): 18 Body Mass Index (BMI): 41.3 Blood Pressure (mmHg): 146/94 Reference Range: 80 - 120 mg / dl Electronic Signature(s) Signed: 04/27/2023 4:20:28 PM By: Brenton Grills Entered By: Brenton Grills on 04/27/2023 11:02:46

## 2023-04-27 NOTE — Progress Notes (Addendum)
Sponge, Non-Sterile 4x4 in (Generic) 1 x Per Day/Other:until healed Discharge Instructions: Apply over primary dressing as directed. Secured With: 36M Medipore H Soft Cloth Surgical T ape, 4 x 10 (in/yd) (Generic) 1 x Per Day/Other:until healed Discharge Instructions: Secure with tape as directed. Compression Wrap: Kerlix Roll 4.5x3.1 (in/yd) (Generic) 1 x Per Day/Other:until healed Discharge Instructions: Apply Kerlix and Coban compression as directed. Add-Ons: [employer] Offloading Boot, Size Regular 1 x Per Day/Other:until healed Wound #5 - Foot Wound Laterality: Plantar, Right Topical: Gentamicin 1 x Per Day/30 Days Discharge Instructions: As directed by physician Prim Dressing: Maxorb Extra Ag+ Alginate Dressing, 4x4.75 (in/in) 1 x Per Day/30 Days ary Discharge Instructions: Apply to wound bed as instructed Secondary Dressing: Drawtex 4x4 in 1 x Per Day/30 Days Discharge Instructions: Apply over primary dressing as directed. Secondary Dressing: Optifoam  Non-Adhesive Dressing, 4x4 in 1 x Per Day/30 Days Discharge Instructions: Apply over primary dressing as directed. Secured With: [employer], 4.5x3.1 (in/yd) 1 x Per Day/30 Days Discharge Instructions: Secure with Kerlix as directed. Patient Medications llergies: No Known Drug Allergies A Notifications Medication Indication Start End 04/27/2023 gentamicin DOSE topical 0.1 % ointment - apply thin layer to wounds with dressing changes Electronic Signature(s) Signed: 04/27/2023 12:33:23 PM By: [name] MD FACS Signed: 04/27/2023 4:20:28 PM By: Brenton Grills Previous Signature: 04/27/2023 11:42:07 AM Version By: [name] MD FACS Previous Signature: 04/27/2023 11:34:47 AM Version By: [name] MD FACS Entered By: Brenton Grills on 04/27/2023 08:44:46 -------------------------------------------------------------------------------- Problem List Details Patient Name: Date of Service: [name], [name] 04/27/2023 11:00 A M Medical Record Number: [id number] Patient Account Number: [id number] Date of Birth/Sex: Treating RN: [birth year] (47 y.o. [name] Primary Care Provider: Dorinda Hill Other Clinician: Referring Provider: Treating Provider/Extender: Jana Hakim in Treatment: [phone] Active Problems ICD-10 Encounter Code Description Active Date MDM Diagnosis [name], [name] ([id number]) [phone].pdf Page 9 of 18 L97.522 Non-pressure chronic ulcer of other part of left foot with fat layer exposed 09/03/2020 No Yes L97.512 Non-pressure chronic ulcer of other part of right foot with fat layer exposed 04/27/2023 No Yes Inactive Problems ICD-10 Code Description Active Date Inactive Date L89.893 Pressure ulcer of other site, stage 3 07/15/2020 07/15/2020 M62.81 Muscle weakness (generalized) 07/15/2020 07/15/2020 I10 Essential (primary) hypertension 07/15/2020 07/15/2020 M86.171 Other acute osteomyelitis, right ankle and  foot 09/03/2020 09/03/2020 Resolved Problems Electronic Signature(s) Signed: 04/27/2023 11:28:10 AM By: [name] MD FACS Entered By: [name] on 04/27/2023 08:28:10 -------------------------------------------------------------------------------- Progress Note Details Patient Name: Date of Service: [name], [name] 04/27/2023 11:00 A M Medical Record Number: [id number] Patient Account Number: [id number] Date of Birth/Sex: Treating RN: 23-Sep-1975 (47 y.o. M) Primary Care Provider: Dorinda Hill Other Clinician: Referring Provider: Treating Provider/Extender: Jana Hakim in Treatment: 145 Subjective Chief Complaint Information obtained from Patient Bilateral Plantar Foot Ulcers History of Present Illness (HPI) Wounds are12/03/2020 upon evaluation today patient presents for initial inspection here in our clinic concerning issues he has been having with the bottoms of his feet bilaterally. He states these actually occurred as wounds when he was hospitalized for 5 months secondary to Covid. He was apparently with tilting bed where he was in an upright position quite frequently and apparently this occurred in some way shape or form during that time. Fortunately there is no sign of active infection at this time. No fevers, chills, nausea, vomiting, or diarrhea. With that being said he still has substantial wounds on the plantar aspects  [id number] Page 17 of 18 Negative for: Cirrhosis ; Colitis; Crohns; Hepatitis A; Hepatitis B; Hepatitis C Endocrine Medical History: Negative for: Type I Diabetes; Type II Diabetes Genitourinary Medical History: Negative for: End Stage Renal Disease Past Medical History Notes: kidney stone Immunological Medical History: Negative for: Lupus Erythematosus; Raynauds; Scleroderma Integumentary (Skin) Medical History: Negative for: History of Burn Musculoskeletal Medical History: Negative for: Gout; Rheumatoid Arthritis; Osteoarthritis; Osteomyelitis Neurologic Medical History: Negative for: Dementia; Neuropathy; Quadriplegia; Paraplegia; Seizure Disorder Oncologic Medical History: Negative for: Received Chemotherapy; Received Radiation Psychiatric Medical History: Negative for: Anorexia/bulimia; Confinement Anxiety Past Medical History Notes: anxiety Immunizations Pneumococcal Vaccine: Received Pneumococcal Vaccination: No Implantable Devices None Hospitalization / Surgery History Type of Hospitalization/Surgery COVID PNA 07/22/2019- 11/14/2019 03/27/2020 wound debridement/ skin graft Family and Social History Cancer: Yes - Maternal Grandparents; Diabetes: Yes - Father,Paternal Grandparents; Heart Disease: Yes - Maternal  Grandparents; Hereditary Spherocytosis: No; Hypertension: Yes - Father,Paternal Grandparents; Kidney Disease: No; Lung Disease: Yes - Siblings; Seizures: No; Stroke: No; Thyroid Problems: No; Tuberculosis: No; Never smoker; Marital Status - Married; Alcohol Use: Never; Drug Use: No History; Caffeine Use: Daily - tea, soda; Financial Concerns: No; Food, Clothing or Shelter Needs: No; Support System Lacking: No; Transportation Concerns: No Electronic Signature(s) Signed: 04/27/2023 11:42:07 AM By: [name] MD FACS Entered By: [name] on 04/27/2023 08:31:45 [name] ([id number]) [id number] Page 18 of 18 -------------------------------------------------------------------------------- SuperBill Details Patient Name: Date of Service: [name], TO [name] 04/27/2023 Medical Record Number: [id number] Patient Account Number: [id number] Date of Birth/Sex: Treating RN: 1976-01-25 (47 y.o. 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 Facility Procedures : CPT4 Code: 93235573 Description: 97597 - DEBRIDE WOUND 1ST 20 SQ CM OR < ICD-10 Diagnosis Description L97.522 Non-pressure chronic ulcer of other part of left foot with fat layer exposed Modifier: Quantity: 1 Physician Procedures : CPT4 Code Description Modifier [id number] 99214 - WC PHYS LEVEL 4 - EST PT 25 ICD-10 Diagnosis Description L97.512 Non-pressure chronic ulcer of other part of right foot with fat layer exposed L97.522 Non-pressure chronic ulcer of other part of left foot  with fat layer exposed Quantity: 1 : [id number] 97597 - WC PHYS DEBR WO ANESTH 20 SQ CM ICD-10  Diagnosis Description L97.522 Non-pressure chronic ulcer of other part of left foot with fat layer exposed Quantity: 1 Electronic Signature(s) Signed: 04/27/2023 11:37:43 AM By: [name] MD FACS Entered By: [name] on 04/27/2023 08:37:42  [id number] Page 17 of 18 Negative for: Cirrhosis ; Colitis; Crohns; Hepatitis A; Hepatitis B; Hepatitis C Endocrine Medical History: Negative for: Type I Diabetes; Type II Diabetes Genitourinary Medical History: Negative for: End Stage Renal Disease Past Medical History Notes: kidney stone Immunological Medical History: Negative for: Lupus Erythematosus; Raynauds; Scleroderma Integumentary (Skin) Medical History: Negative for: History of Burn Musculoskeletal Medical History: Negative for: Gout; Rheumatoid Arthritis; Osteoarthritis; Osteomyelitis Neurologic Medical History: Negative for: Dementia; Neuropathy; Quadriplegia; Paraplegia; Seizure Disorder Oncologic Medical History: Negative for: Received Chemotherapy; Received Radiation Psychiatric Medical History: Negative for: Anorexia/bulimia; Confinement Anxiety Past Medical History Notes: anxiety Immunizations Pneumococcal Vaccine: Received Pneumococcal Vaccination: No Implantable Devices None Hospitalization / Surgery History Type of Hospitalization/Surgery COVID PNA 07/22/2019- 11/14/2019 03/27/2020 wound debridement/ skin graft Family and Social History Cancer: Yes - Maternal Grandparents; Diabetes: Yes - Father,Paternal Grandparents; Heart Disease: Yes - Maternal  Grandparents; Hereditary Spherocytosis: No; Hypertension: Yes - Father,Paternal Grandparents; Kidney Disease: No; Lung Disease: Yes - Siblings; Seizures: No; Stroke: No; Thyroid Problems: No; Tuberculosis: No; Never smoker; Marital Status - Married; Alcohol Use: Never; Drug Use: No History; Caffeine Use: Daily - tea, soda; Financial Concerns: No; Food, Clothing or Shelter Needs: No; Support System Lacking: No; Transportation Concerns: No Electronic Signature(s) Signed: 04/27/2023 11:42:07 AM By: [name] MD FACS Entered By: [name] on 04/27/2023 08:31:45 [name] ([id number]) [id number] Page 18 of 18 -------------------------------------------------------------------------------- SuperBill Details Patient Name: Date of Service: [name], TO [name] 04/27/2023 Medical Record Number: [id number] Patient Account Number: [id number] Date of Birth/Sex: Treating RN: 1976-01-25 (47 y.o. 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 Facility Procedures : CPT4 Code: 93235573 Description: 97597 - DEBRIDE WOUND 1ST 20 SQ CM OR < ICD-10 Diagnosis Description L97.522 Non-pressure chronic ulcer of other part of left foot with fat layer exposed Modifier: Quantity: 1 Physician Procedures : CPT4 Code Description Modifier [id number] 99214 - WC PHYS LEVEL 4 - EST PT 25 ICD-10 Diagnosis Description L97.512 Non-pressure chronic ulcer of other part of right foot with fat layer exposed L97.522 Non-pressure chronic ulcer of other part of left foot  with fat layer exposed Quantity: 1 : [id number] 97597 - WC PHYS DEBR WO ANESTH 20 SQ CM ICD-10  Diagnosis Description L97.522 Non-pressure chronic ulcer of other 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 Quantity: 1 Electronic Signature(s) Signed: 04/27/2023 11:37:43 AM By: [name] MD FACS Entered By: [name] on 04/27/2023 08:37:42  [id number] Page 17 of 18 Negative for: Cirrhosis ; Colitis; Crohns; Hepatitis A; Hepatitis B; Hepatitis C Endocrine Medical History: Negative for: Type I Diabetes; Type II Diabetes Genitourinary Medical History: Negative for: End Stage Renal Disease Past Medical History Notes: kidney stone Immunological Medical History: Negative for: Lupus Erythematosus; Raynauds; Scleroderma Integumentary (Skin) Medical History: Negative for: History of Burn Musculoskeletal Medical History: Negative for: Gout; Rheumatoid Arthritis; Osteoarthritis; Osteomyelitis Neurologic Medical History: Negative for: Dementia; Neuropathy; Quadriplegia; Paraplegia; Seizure Disorder Oncologic Medical History: Negative for: Received Chemotherapy; Received Radiation Psychiatric Medical History: Negative for: Anorexia/bulimia; Confinement Anxiety Past Medical History Notes: anxiety Immunizations Pneumococcal Vaccine: Received Pneumococcal Vaccination: No Implantable Devices None Hospitalization / Surgery History Type of Hospitalization/Surgery COVID PNA 07/22/2019- 11/14/2019 03/27/2020 wound debridement/ skin graft Family and Social History Cancer: Yes - Maternal Grandparents; Diabetes: Yes - Father,Paternal Grandparents; Heart Disease: Yes - Maternal  Grandparents; Hereditary Spherocytosis: No; Hypertension: Yes - Father,Paternal Grandparents; Kidney Disease: No; Lung Disease: Yes - Siblings; Seizures: No; Stroke: No; Thyroid Problems: No; Tuberculosis: No; Never smoker; Marital Status - Married; Alcohol Use: Never; Drug Use: No History; Caffeine Use: Daily - tea, soda; Financial Concerns: No; Food, Clothing or Shelter Needs: No; Support System Lacking: No; Transportation Concerns: No Electronic Signature(s) Signed: 04/27/2023 11:42:07 AM By: [name] MD FACS Entered By: [name] on 04/27/2023 08:31:45 [name] ([id number]) [id number] Page 18 of 18 -------------------------------------------------------------------------------- SuperBill Details Patient Name: Date of Service: [name], TO [name] 04/27/2023 Medical Record Number: [id number] Patient Account Number: [id number] Date of Birth/Sex: Treating RN: 1976-01-25 (47 y.o. 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 Facility Procedures : CPT4 Code: 93235573 Description: 97597 - DEBRIDE WOUND 1ST 20 SQ CM OR < ICD-10 Diagnosis Description L97.522 Non-pressure chronic ulcer of other part of left foot with fat layer exposed Modifier: Quantity: 1 Physician Procedures : CPT4 Code Description Modifier [id number] 99214 - WC PHYS LEVEL 4 - EST PT 25 ICD-10 Diagnosis Description L97.512 Non-pressure chronic ulcer of other part of right foot with fat layer exposed L97.522 Non-pressure chronic ulcer of other part of left foot  with fat layer exposed Quantity: 1 : [id number] 97597 - WC PHYS DEBR WO ANESTH 20 SQ CM ICD-10  Diagnosis Description L97.522 Non-pressure chronic ulcer of other part of left foot with fat layer exposed Quantity: 1 Electronic Signature(s) Signed: 04/27/2023 11:37:43 AM By: [name] MD FACS Entered By: [name] on 04/27/2023 08:37:42  [id number] Page 17 of 18 Negative for: Cirrhosis ; Colitis; Crohns; Hepatitis A; Hepatitis B; Hepatitis C Endocrine Medical History: Negative for: Type I Diabetes; Type II Diabetes Genitourinary Medical History: Negative for: End Stage Renal Disease Past Medical History Notes: kidney stone Immunological Medical History: Negative for: Lupus Erythematosus; Raynauds; Scleroderma Integumentary (Skin) Medical History: Negative for: History of Burn Musculoskeletal Medical History: Negative for: Gout; Rheumatoid Arthritis; Osteoarthritis; Osteomyelitis Neurologic Medical History: Negative for: Dementia; Neuropathy; Quadriplegia; Paraplegia; Seizure Disorder Oncologic Medical History: Negative for: Received Chemotherapy; Received Radiation Psychiatric Medical History: Negative for: Anorexia/bulimia; Confinement Anxiety Past Medical History Notes: anxiety Immunizations Pneumococcal Vaccine: Received Pneumococcal Vaccination: No Implantable Devices None Hospitalization / Surgery History Type of Hospitalization/Surgery COVID PNA 07/22/2019- 11/14/2019 03/27/2020 wound debridement/ skin graft Family and Social History Cancer: Yes - Maternal Grandparents; Diabetes: Yes - Father,Paternal Grandparents; Heart Disease: Yes - Maternal  Grandparents; Hereditary Spherocytosis: No; Hypertension: Yes - Father,Paternal Grandparents; Kidney Disease: No; Lung Disease: Yes - Siblings; Seizures: No; Stroke: No; Thyroid Problems: No; Tuberculosis: No; Never smoker; Marital Status - Married; Alcohol Use: Never; Drug Use: No History; Caffeine Use: Daily - tea, soda; Financial Concerns: No; Food, Clothing or Shelter Needs: No; Support System Lacking: No; Transportation Concerns: No Electronic Signature(s) Signed: 04/27/2023 11:42:07 AM By: [name] MD FACS Entered By: [name] on 04/27/2023 08:31:45 [name] ([id number]) [id number] Page 18 of 18 -------------------------------------------------------------------------------- SuperBill Details Patient Name: Date of Service: [name], TO [name] 04/27/2023 Medical Record Number: [id number] Patient Account Number: [id number] Date of Birth/Sex: Treating RN: 1976-01-25 (47 y.o. 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 Facility Procedures : CPT4 Code: 93235573 Description: 97597 - DEBRIDE WOUND 1ST 20 SQ CM OR < ICD-10 Diagnosis Description L97.522 Non-pressure chronic ulcer of other part of left foot with fat layer exposed Modifier: Quantity: 1 Physician Procedures : CPT4 Code Description Modifier [id number] 99214 - WC PHYS LEVEL 4 - EST PT 25 ICD-10 Diagnosis Description L97.512 Non-pressure chronic ulcer of other part of right foot with fat layer exposed L97.522 Non-pressure chronic ulcer of other part of left foot  with fat layer exposed Quantity: 1 : [id number] 97597 - WC PHYS DEBR WO ANESTH 20 SQ CM ICD-10  Diagnosis Description L97.522 Non-pressure chronic ulcer of other part of left foot with fat layer exposed Quantity: 1 Electronic Signature(s) Signed: 04/27/2023 11:37:43 AM By: [name] MD FACS Entered By: [name] on 04/27/2023 08:37:42  Sponge, Non-Sterile 4x4 in (Generic) 1 x Per Day/Other:until healed Discharge Instructions: Apply over primary dressing as directed. Secured With: 36M Medipore H Soft Cloth Surgical T ape, 4 x 10 (in/yd) (Generic) 1 x Per Day/Other:until healed Discharge Instructions: Secure with tape as directed. Compression Wrap: Kerlix Roll 4.5x3.1 (in/yd) (Generic) 1 x Per Day/Other:until healed Discharge Instructions: Apply Kerlix and Coban compression as directed. Add-Ons: [employer] Offloading Boot, Size Regular 1 x Per Day/Other:until healed Wound #5 - Foot Wound Laterality: Plantar, Right Topical: Gentamicin 1 x Per Day/30 Days Discharge Instructions: As directed by physician Prim Dressing: Maxorb Extra Ag+ Alginate Dressing, 4x4.75 (in/in) 1 x Per Day/30 Days ary Discharge Instructions: Apply to wound bed as instructed Secondary Dressing: Drawtex 4x4 in 1 x Per Day/30 Days Discharge Instructions: Apply over primary dressing as directed. Secondary Dressing: Optifoam  Non-Adhesive Dressing, 4x4 in 1 x Per Day/30 Days Discharge Instructions: Apply over primary dressing as directed. Secured With: [employer], 4.5x3.1 (in/yd) 1 x Per Day/30 Days Discharge Instructions: Secure with Kerlix as directed. Patient Medications llergies: No Known Drug Allergies A Notifications Medication Indication Start End 04/27/2023 gentamicin DOSE topical 0.1 % ointment - apply thin layer to wounds with dressing changes Electronic Signature(s) Signed: 04/27/2023 12:33:23 PM By: [name] MD FACS Signed: 04/27/2023 4:20:28 PM By: Brenton Grills Previous Signature: 04/27/2023 11:42:07 AM Version By: [name] MD FACS Previous Signature: 04/27/2023 11:34:47 AM Version By: [name] MD FACS Entered By: Brenton Grills on 04/27/2023 08:44:46 -------------------------------------------------------------------------------- Problem List Details Patient Name: Date of Service: [name], [name] 04/27/2023 11:00 A M Medical Record Number: [id number] Patient Account Number: [id number] Date of Birth/Sex: Treating RN: [birth year] (47 y.o. [name] Primary Care Provider: Dorinda Hill Other Clinician: Referring Provider: Treating Provider/Extender: Jana Hakim in Treatment: [phone] Active Problems ICD-10 Encounter Code Description Active Date MDM Diagnosis [name], [name] ([id number]) [phone].pdf Page 9 of 18 L97.522 Non-pressure chronic ulcer of other part of left foot with fat layer exposed 09/03/2020 No Yes L97.512 Non-pressure chronic ulcer of other part of right foot with fat layer exposed 04/27/2023 No Yes Inactive Problems ICD-10 Code Description Active Date Inactive Date L89.893 Pressure ulcer of other site, stage 3 07/15/2020 07/15/2020 M62.81 Muscle weakness (generalized) 07/15/2020 07/15/2020 I10 Essential (primary) hypertension 07/15/2020 07/15/2020 M86.171 Other acute osteomyelitis, right ankle and  foot 09/03/2020 09/03/2020 Resolved Problems Electronic Signature(s) Signed: 04/27/2023 11:28:10 AM By: [name] MD FACS Entered By: [name] on 04/27/2023 08:28:10 -------------------------------------------------------------------------------- Progress Note Details Patient Name: Date of Service: [name], [name] 04/27/2023 11:00 A M Medical Record Number: [id number] Patient Account Number: [id number] Date of Birth/Sex: Treating RN: 23-Sep-1975 (47 y.o. M) Primary Care Provider: Dorinda Hill Other Clinician: Referring Provider: Treating Provider/Extender: Jana Hakim in Treatment: 145 Subjective Chief Complaint Information obtained from Patient Bilateral Plantar Foot Ulcers History of Present Illness (HPI) Wounds are12/03/2020 upon evaluation today patient presents for initial inspection here in our clinic concerning issues he has been having with the bottoms of his feet bilaterally. He states these actually occurred as wounds when he was hospitalized for 5 months secondary to Covid. He was apparently with tilting bed where he was in an upright position quite frequently and apparently this occurred in some way shape or form during that time. Fortunately there is no sign of active infection at this time. No fevers, chills, nausea, vomiting, or diarrhea. With that being said he still has substantial wounds on the plantar aspects  [id number] Page 17 of 18 Negative for: Cirrhosis ; Colitis; Crohns; Hepatitis A; Hepatitis B; Hepatitis C Endocrine Medical History: Negative for: Type I Diabetes; Type II Diabetes Genitourinary Medical History: Negative for: End Stage Renal Disease Past Medical History Notes: kidney stone Immunological Medical History: Negative for: Lupus Erythematosus; Raynauds; Scleroderma Integumentary (Skin) Medical History: Negative for: History of Burn Musculoskeletal Medical History: Negative for: Gout; Rheumatoid Arthritis; Osteoarthritis; Osteomyelitis Neurologic Medical History: Negative for: Dementia; Neuropathy; Quadriplegia; Paraplegia; Seizure Disorder Oncologic Medical History: Negative for: Received Chemotherapy; Received Radiation Psychiatric Medical History: Negative for: Anorexia/bulimia; Confinement Anxiety Past Medical History Notes: anxiety Immunizations Pneumococcal Vaccine: Received Pneumococcal Vaccination: No Implantable Devices None Hospitalization / Surgery History Type of Hospitalization/Surgery COVID PNA 07/22/2019- 11/14/2019 03/27/2020 wound debridement/ skin graft Family and Social History Cancer: Yes - Maternal Grandparents; Diabetes: Yes - Father,Paternal Grandparents; Heart Disease: Yes - Maternal  Grandparents; Hereditary Spherocytosis: No; Hypertension: Yes - Father,Paternal Grandparents; Kidney Disease: No; Lung Disease: Yes - Siblings; Seizures: No; Stroke: No; Thyroid Problems: No; Tuberculosis: No; Never smoker; Marital Status - Married; Alcohol Use: Never; Drug Use: No History; Caffeine Use: Daily - tea, soda; Financial Concerns: No; Food, Clothing or Shelter Needs: No; Support System Lacking: No; Transportation Concerns: No Electronic Signature(s) Signed: 04/27/2023 11:42:07 AM By: [name] MD FACS Entered By: [name] on 04/27/2023 08:31:45 [name] ([id number]) [id number] Page 18 of 18 -------------------------------------------------------------------------------- SuperBill Details Patient Name: Date of Service: [name], TO [name] 04/27/2023 Medical Record Number: [id number] Patient Account Number: [id number] Date of Birth/Sex: Treating RN: 1976-01-25 (47 y.o. 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 Facility Procedures : CPT4 Code: 93235573 Description: 97597 - DEBRIDE WOUND 1ST 20 SQ CM OR < ICD-10 Diagnosis Description L97.522 Non-pressure chronic ulcer of other part of left foot with fat layer exposed Modifier: Quantity: 1 Physician Procedures : CPT4 Code Description Modifier [id number] 99214 - WC PHYS LEVEL 4 - EST PT 25 ICD-10 Diagnosis Description L97.512 Non-pressure chronic ulcer of other part of right foot with fat layer exposed L97.522 Non-pressure chronic ulcer of other part of left foot  with fat layer exposed Quantity: 1 : [id number] 97597 - WC PHYS DEBR WO ANESTH 20 SQ CM ICD-10  Diagnosis Description L97.522 Non-pressure chronic ulcer of other part of left foot with fat layer exposed Quantity: 1 Electronic Signature(s) Signed: 04/27/2023 11:37:43 AM By: [name] MD FACS Entered By: [name] on 04/27/2023 08:37:42  [id number] Page 17 of 18 Negative for: Cirrhosis ; Colitis; Crohns; Hepatitis A; Hepatitis B; Hepatitis C Endocrine Medical History: Negative for: Type I Diabetes; Type II Diabetes Genitourinary Medical History: Negative for: End Stage Renal Disease Past Medical History Notes: kidney stone Immunological Medical History: Negative for: Lupus Erythematosus; Raynauds; Scleroderma Integumentary (Skin) Medical History: Negative for: History of Burn Musculoskeletal Medical History: Negative for: Gout; Rheumatoid Arthritis; Osteoarthritis; Osteomyelitis Neurologic Medical History: Negative for: Dementia; Neuropathy; Quadriplegia; Paraplegia; Seizure Disorder Oncologic Medical History: Negative for: Received Chemotherapy; Received Radiation Psychiatric Medical History: Negative for: Anorexia/bulimia; Confinement Anxiety Past Medical History Notes: anxiety Immunizations Pneumococcal Vaccine: Received Pneumococcal Vaccination: No Implantable Devices None Hospitalization / Surgery History Type of Hospitalization/Surgery COVID PNA 07/22/2019- 11/14/2019 03/27/2020 wound debridement/ skin graft Family and Social History Cancer: Yes - Maternal Grandparents; Diabetes: Yes - Father,Paternal Grandparents; Heart Disease: Yes - Maternal  Grandparents; Hereditary Spherocytosis: No; Hypertension: Yes - Father,Paternal Grandparents; Kidney Disease: No; Lung Disease: Yes - Siblings; Seizures: No; Stroke: No; Thyroid Problems: No; Tuberculosis: No; Never smoker; Marital Status - Married; Alcohol Use: Never; Drug Use: No History; Caffeine Use: Daily - tea, soda; Financial Concerns: No; Food, Clothing or Shelter Needs: No; Support System Lacking: No; Transportation Concerns: No Electronic Signature(s) Signed: 04/27/2023 11:42:07 AM By: [name] MD FACS Entered By: [name] on 04/27/2023 08:31:45 [name] ([id number]) [id number] Page 18 of 18 -------------------------------------------------------------------------------- SuperBill Details Patient Name: Date of Service: [name], TO [name] 04/27/2023 Medical Record Number: [id number] Patient Account Number: [id number] Date of Birth/Sex: Treating RN: 1976-01-25 (47 y.o. 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 Facility Procedures : CPT4 Code: 93235573 Description: 97597 - DEBRIDE WOUND 1ST 20 SQ CM OR < ICD-10 Diagnosis Description L97.522 Non-pressure chronic ulcer of other part of left foot with fat layer exposed Modifier: Quantity: 1 Physician Procedures : CPT4 Code Description Modifier [id number] 99214 - WC PHYS LEVEL 4 - EST PT 25 ICD-10 Diagnosis Description L97.512 Non-pressure chronic ulcer of other part of right foot with fat layer exposed L97.522 Non-pressure chronic ulcer of other part of left foot  with fat layer exposed Quantity: 1 : [id number] 97597 - WC PHYS DEBR WO ANESTH 20 SQ CM ICD-10  Diagnosis Description L97.522 Non-pressure chronic ulcer of other part of left foot with fat layer exposed Quantity: 1 Electronic Signature(s) Signed: 04/27/2023 11:37:43 AM By: [name] MD FACS Entered By: [name] on 04/27/2023 08:37:42  [id number] Page 17 of 18 Negative for: Cirrhosis ; Colitis; Crohns; Hepatitis A; Hepatitis B; Hepatitis C Endocrine Medical History: Negative for: Type I Diabetes; Type II Diabetes Genitourinary Medical History: Negative for: End Stage Renal Disease Past Medical History Notes: kidney stone Immunological Medical History: Negative for: Lupus Erythematosus; Raynauds; Scleroderma Integumentary (Skin) Medical History: Negative for: History of Burn Musculoskeletal Medical History: Negative for: Gout; Rheumatoid Arthritis; Osteoarthritis; Osteomyelitis Neurologic Medical History: Negative for: Dementia; Neuropathy; Quadriplegia; Paraplegia; Seizure Disorder Oncologic Medical History: Negative for: Received Chemotherapy; Received Radiation Psychiatric Medical History: Negative for: Anorexia/bulimia; Confinement Anxiety Past Medical History Notes: anxiety Immunizations Pneumococcal Vaccine: Received Pneumococcal Vaccination: No Implantable Devices None Hospitalization / Surgery History Type of Hospitalization/Surgery COVID PNA 07/22/2019- 11/14/2019 03/27/2020 wound debridement/ skin graft Family and Social History Cancer: Yes - Maternal Grandparents; Diabetes: Yes - Father,Paternal Grandparents; Heart Disease: Yes - Maternal  Grandparents; Hereditary Spherocytosis: No; Hypertension: Yes - Father,Paternal Grandparents; Kidney Disease: No; Lung Disease: Yes - Siblings; Seizures: No; Stroke: No; Thyroid Problems: No; Tuberculosis: No; Never smoker; Marital Status - Married; Alcohol Use: Never; Drug Use: No History; Caffeine Use: Daily - tea, soda; Financial Concerns: No; Food, Clothing or Shelter Needs: No; Support System Lacking: No; Transportation Concerns: No Electronic Signature(s) Signed: 04/27/2023 11:42:07 AM By: [name] MD FACS Entered By: [name] on 04/27/2023 08:31:45 [name] ([id number]) [id number] Page 18 of 18 -------------------------------------------------------------------------------- SuperBill Details Patient Name: Date of Service: [name], TO [name] 04/27/2023 Medical Record Number: [id number] Patient Account Number: [id number] Date of Birth/Sex: Treating RN: 1976-01-25 (47 y.o. 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 Facility Procedures : CPT4 Code: 93235573 Description: 97597 - DEBRIDE WOUND 1ST 20 SQ CM OR < ICD-10 Diagnosis Description L97.522 Non-pressure chronic ulcer of other part of left foot with fat layer exposed Modifier: Quantity: 1 Physician Procedures : CPT4 Code Description Modifier [id number] 99214 - WC PHYS LEVEL 4 - EST PT 25 ICD-10 Diagnosis Description L97.512 Non-pressure chronic ulcer of other part of right foot with fat layer exposed L97.522 Non-pressure chronic ulcer of other part of left foot  with fat layer exposed Quantity: 1 : [id number] 97597 - WC PHYS DEBR WO ANESTH 20 SQ CM ICD-10  Diagnosis Description L97.522 Non-pressure chronic ulcer of other part of left foot with fat layer exposed Quantity: 1 Electronic Signature(s) Signed: 04/27/2023 11:37:43 AM By: [name] MD FACS Entered By: [name] on 04/27/2023 08:37:42  Sponge, Non-Sterile 4x4 in (Generic) 1 x Per Day/Other:until healed Discharge Instructions: Apply over primary dressing as directed. Secured With: 36M Medipore H Soft Cloth Surgical T ape, 4 x 10 (in/yd) (Generic) 1 x Per Day/Other:until healed Discharge Instructions: Secure with tape as directed. Compression Wrap: Kerlix Roll 4.5x3.1 (in/yd) (Generic) 1 x Per Day/Other:until healed Discharge Instructions: Apply Kerlix and Coban compression as directed. Add-Ons: [employer] Offloading Boot, Size Regular 1 x Per Day/Other:until healed Wound #5 - Foot Wound Laterality: Plantar, Right Topical: Gentamicin 1 x Per Day/30 Days Discharge Instructions: As directed by physician Prim Dressing: Maxorb Extra Ag+ Alginate Dressing, 4x4.75 (in/in) 1 x Per Day/30 Days ary Discharge Instructions: Apply to wound bed as instructed Secondary Dressing: Drawtex 4x4 in 1 x Per Day/30 Days Discharge Instructions: Apply over primary dressing as directed. Secondary Dressing: Optifoam  Non-Adhesive Dressing, 4x4 in 1 x Per Day/30 Days Discharge Instructions: Apply over primary dressing as directed. Secured With: [employer], 4.5x3.1 (in/yd) 1 x Per Day/30 Days Discharge Instructions: Secure with Kerlix as directed. Patient Medications llergies: No Known Drug Allergies A Notifications Medication Indication Start End 04/27/2023 gentamicin DOSE topical 0.1 % ointment - apply thin layer to wounds with dressing changes Electronic Signature(s) Signed: 04/27/2023 12:33:23 PM By: [name] MD FACS Signed: 04/27/2023 4:20:28 PM By: Brenton Grills Previous Signature: 04/27/2023 11:42:07 AM Version By: [name] MD FACS Previous Signature: 04/27/2023 11:34:47 AM Version By: [name] MD FACS Entered By: Brenton Grills on 04/27/2023 08:44:46 -------------------------------------------------------------------------------- Problem List Details Patient Name: Date of Service: [name], [name] 04/27/2023 11:00 A M Medical Record Number: [id number] Patient Account Number: [id number] Date of Birth/Sex: Treating RN: [birth year] (47 y.o. [name] Primary Care Provider: Dorinda Hill Other Clinician: Referring Provider: Treating Provider/Extender: Jana Hakim in Treatment: [phone] Active Problems ICD-10 Encounter Code Description Active Date MDM Diagnosis [name], [name] ([id number]) [phone].pdf Page 9 of 18 L97.522 Non-pressure chronic ulcer of other part of left foot with fat layer exposed 09/03/2020 No Yes L97.512 Non-pressure chronic ulcer of other part of right foot with fat layer exposed 04/27/2023 No Yes Inactive Problems ICD-10 Code Description Active Date Inactive Date L89.893 Pressure ulcer of other site, stage 3 07/15/2020 07/15/2020 M62.81 Muscle weakness (generalized) 07/15/2020 07/15/2020 I10 Essential (primary) hypertension 07/15/2020 07/15/2020 M86.171 Other acute osteomyelitis, right ankle and  foot 09/03/2020 09/03/2020 Resolved Problems Electronic Signature(s) Signed: 04/27/2023 11:28:10 AM By: [name] MD FACS Entered By: [name] on 04/27/2023 08:28:10 -------------------------------------------------------------------------------- Progress Note Details Patient Name: Date of Service: [name], [name] 04/27/2023 11:00 A M Medical Record Number: [id number] Patient Account Number: [id number] Date of Birth/Sex: Treating RN: 23-Sep-1975 (47 y.o. M) Primary Care Provider: Dorinda Hill Other Clinician: Referring Provider: Treating Provider/Extender: Jana Hakim in Treatment: 145 Subjective Chief Complaint Information obtained from Patient Bilateral Plantar Foot Ulcers History of Present Illness (HPI) Wounds are12/03/2020 upon evaluation today patient presents for initial inspection here in our clinic concerning issues he has been having with the bottoms of his feet bilaterally. He states these actually occurred as wounds when he was hospitalized for 5 months secondary to Covid. He was apparently with tilting bed where he was in an upright position quite frequently and apparently this occurred in some way shape or form during that time. Fortunately there is no sign of active infection at this time. No fevers, chills, nausea, vomiting, or diarrhea. With that being said he still has substantial wounds on the plantar aspects  Sponge, Non-Sterile 4x4 in (Generic) 1 x Per Day/Other:until healed Discharge Instructions: Apply over primary dressing as directed. Secured With: 36M Medipore H Soft Cloth Surgical T ape, 4 x 10 (in/yd) (Generic) 1 x Per Day/Other:until healed Discharge Instructions: Secure with tape as directed. Compression Wrap: Kerlix Roll 4.5x3.1 (in/yd) (Generic) 1 x Per Day/Other:until healed Discharge Instructions: Apply Kerlix and Coban compression as directed. Add-Ons: [employer] Offloading Boot, Size Regular 1 x Per Day/Other:until healed Wound #5 - Foot Wound Laterality: Plantar, Right Topical: Gentamicin 1 x Per Day/30 Days Discharge Instructions: As directed by physician Prim Dressing: Maxorb Extra Ag+ Alginate Dressing, 4x4.75 (in/in) 1 x Per Day/30 Days ary Discharge Instructions: Apply to wound bed as instructed Secondary Dressing: Drawtex 4x4 in 1 x Per Day/30 Days Discharge Instructions: Apply over primary dressing as directed. Secondary Dressing: Optifoam  Non-Adhesive Dressing, 4x4 in 1 x Per Day/30 Days Discharge Instructions: Apply over primary dressing as directed. Secured With: [employer], 4.5x3.1 (in/yd) 1 x Per Day/30 Days Discharge Instructions: Secure with Kerlix as directed. Patient Medications llergies: No Known Drug Allergies A Notifications Medication Indication Start End 04/27/2023 gentamicin DOSE topical 0.1 % ointment - apply thin layer to wounds with dressing changes Electronic Signature(s) Signed: 04/27/2023 12:33:23 PM By: [name] MD FACS Signed: 04/27/2023 4:20:28 PM By: Brenton Grills Previous Signature: 04/27/2023 11:42:07 AM Version By: [name] MD FACS Previous Signature: 04/27/2023 11:34:47 AM Version By: [name] MD FACS Entered By: Brenton Grills on 04/27/2023 08:44:46 -------------------------------------------------------------------------------- Problem List Details Patient Name: Date of Service: [name], [name] 04/27/2023 11:00 A M Medical Record Number: [id number] Patient Account Number: [id number] Date of Birth/Sex: Treating RN: [birth year] (47 y.o. [name] Primary Care Provider: Dorinda Hill Other Clinician: Referring Provider: Treating Provider/Extender: Jana Hakim in Treatment: [phone] Active Problems ICD-10 Encounter Code Description Active Date MDM Diagnosis [name], [name] ([id number]) [phone].pdf Page 9 of 18 L97.522 Non-pressure chronic ulcer of other part of left foot with fat layer exposed 09/03/2020 No Yes L97.512 Non-pressure chronic ulcer of other part of right foot with fat layer exposed 04/27/2023 No Yes Inactive Problems ICD-10 Code Description Active Date Inactive Date L89.893 Pressure ulcer of other site, stage 3 07/15/2020 07/15/2020 M62.81 Muscle weakness (generalized) 07/15/2020 07/15/2020 I10 Essential (primary) hypertension 07/15/2020 07/15/2020 M86.171 Other acute osteomyelitis, right ankle and  foot 09/03/2020 09/03/2020 Resolved Problems Electronic Signature(s) Signed: 04/27/2023 11:28:10 AM By: [name] MD FACS Entered By: [name] on 04/27/2023 08:28:10 -------------------------------------------------------------------------------- Progress Note Details Patient Name: Date of Service: [name], [name] 04/27/2023 11:00 A M Medical Record Number: [id number] Patient Account Number: [id number] Date of Birth/Sex: Treating RN: 23-Sep-1975 (47 y.o. M) Primary Care Provider: Dorinda Hill Other Clinician: Referring Provider: Treating Provider/Extender: Jana Hakim in Treatment: 145 Subjective Chief Complaint Information obtained from Patient Bilateral Plantar Foot Ulcers History of Present Illness (HPI) Wounds are12/03/2020 upon evaluation today patient presents for initial inspection here in our clinic concerning issues he has been having with the bottoms of his feet bilaterally. He states these actually occurred as wounds when he was hospitalized for 5 months secondary to Covid. He was apparently with tilting bed where he was in an upright position quite frequently and apparently this occurred in some way shape or form during that time. Fortunately there is no sign of active infection at this time. No fevers, chills, nausea, vomiting, or diarrhea. With that being said he still has substantial wounds on the plantar aspects  Sponge, Non-Sterile 4x4 in (Generic) 1 x Per Day/Other:until healed Discharge Instructions: Apply over primary dressing as directed. Secured With: 36M Medipore H Soft Cloth Surgical T ape, 4 x 10 (in/yd) (Generic) 1 x Per Day/Other:until healed Discharge Instructions: Secure with tape as directed. Compression Wrap: Kerlix Roll 4.5x3.1 (in/yd) (Generic) 1 x Per Day/Other:until healed Discharge Instructions: Apply Kerlix and Coban compression as directed. Add-Ons: [employer] Offloading Boot, Size Regular 1 x Per Day/Other:until healed Wound #5 - Foot Wound Laterality: Plantar, Right Topical: Gentamicin 1 x Per Day/30 Days Discharge Instructions: As directed by physician Prim Dressing: Maxorb Extra Ag+ Alginate Dressing, 4x4.75 (in/in) 1 x Per Day/30 Days ary Discharge Instructions: Apply to wound bed as instructed Secondary Dressing: Drawtex 4x4 in 1 x Per Day/30 Days Discharge Instructions: Apply over primary dressing as directed. Secondary Dressing: Optifoam  Non-Adhesive Dressing, 4x4 in 1 x Per Day/30 Days Discharge Instructions: Apply over primary dressing as directed. Secured With: [employer], 4.5x3.1 (in/yd) 1 x Per Day/30 Days Discharge Instructions: Secure with Kerlix as directed. Patient Medications llergies: No Known Drug Allergies A Notifications Medication Indication Start End 04/27/2023 gentamicin DOSE topical 0.1 % ointment - apply thin layer to wounds with dressing changes Electronic Signature(s) Signed: 04/27/2023 12:33:23 PM By: [name] MD FACS Signed: 04/27/2023 4:20:28 PM By: Brenton Grills Previous Signature: 04/27/2023 11:42:07 AM Version By: [name] MD FACS Previous Signature: 04/27/2023 11:34:47 AM Version By: [name] MD FACS Entered By: Brenton Grills on 04/27/2023 08:44:46 -------------------------------------------------------------------------------- Problem List Details Patient Name: Date of Service: [name], [name] 04/27/2023 11:00 A M Medical Record Number: [id number] Patient Account Number: [id number] Date of Birth/Sex: Treating RN: [birth year] (47 y.o. [name] Primary Care Provider: Dorinda Hill Other Clinician: Referring Provider: Treating Provider/Extender: Jana Hakim in Treatment: [phone] Active Problems ICD-10 Encounter Code Description Active Date MDM Diagnosis [name], [name] ([id number]) [phone].pdf Page 9 of 18 L97.522 Non-pressure chronic ulcer of other part of left foot with fat layer exposed 09/03/2020 No Yes L97.512 Non-pressure chronic ulcer of other part of right foot with fat layer exposed 04/27/2023 No Yes Inactive Problems ICD-10 Code Description Active Date Inactive Date L89.893 Pressure ulcer of other site, stage 3 07/15/2020 07/15/2020 M62.81 Muscle weakness (generalized) 07/15/2020 07/15/2020 I10 Essential (primary) hypertension 07/15/2020 07/15/2020 M86.171 Other acute osteomyelitis, right ankle and  foot 09/03/2020 09/03/2020 Resolved Problems Electronic Signature(s) Signed: 04/27/2023 11:28:10 AM By: [name] MD FACS Entered By: [name] on 04/27/2023 08:28:10 -------------------------------------------------------------------------------- Progress Note Details Patient Name: Date of Service: [name], [name] 04/27/2023 11:00 A M Medical Record Number: [id number] Patient Account Number: [id number] Date of Birth/Sex: Treating RN: 23-Sep-1975 (47 y.o. M) Primary Care Provider: Dorinda Hill Other Clinician: Referring Provider: Treating Provider/Extender: Jana Hakim in Treatment: 145 Subjective Chief Complaint Information obtained from Patient Bilateral Plantar Foot Ulcers History of Present Illness (HPI) Wounds are12/03/2020 upon evaluation today patient presents for initial inspection here in our clinic concerning issues he has been having with the bottoms of his feet bilaterally. He states these actually occurred as wounds when he was hospitalized for 5 months secondary to Covid. He was apparently with tilting bed where he was in an upright position quite frequently and apparently this occurred in some way shape or form during that time. Fortunately there is no sign of active infection at this time. No fevers, chills, nausea, vomiting, or diarrhea. With that being said he still has substantial wounds on the plantar aspects  [id number] Page 17 of 18 Negative for: Cirrhosis ; Colitis; Crohns; Hepatitis A; Hepatitis B; Hepatitis C Endocrine Medical History: Negative for: Type I Diabetes; Type II Diabetes Genitourinary Medical History: Negative for: End Stage Renal Disease Past Medical History Notes: kidney stone Immunological Medical History: Negative for: Lupus Erythematosus; Raynauds; Scleroderma Integumentary (Skin) Medical History: Negative for: History of Burn Musculoskeletal Medical History: Negative for: Gout; Rheumatoid Arthritis; Osteoarthritis; Osteomyelitis Neurologic Medical History: Negative for: Dementia; Neuropathy; Quadriplegia; Paraplegia; Seizure Disorder Oncologic Medical History: Negative for: Received Chemotherapy; Received Radiation Psychiatric Medical History: Negative for: Anorexia/bulimia; Confinement Anxiety Past Medical History Notes: anxiety Immunizations Pneumococcal Vaccine: Received Pneumococcal Vaccination: No Implantable Devices None Hospitalization / Surgery History Type of Hospitalization/Surgery COVID PNA 07/22/2019- 11/14/2019 03/27/2020 wound debridement/ skin graft Family and Social History Cancer: Yes - Maternal Grandparents; Diabetes: Yes - Father,Paternal Grandparents; Heart Disease: Yes - Maternal  Grandparents; Hereditary Spherocytosis: No; Hypertension: Yes - Father,Paternal Grandparents; Kidney Disease: No; Lung Disease: Yes - Siblings; Seizures: No; Stroke: No; Thyroid Problems: No; Tuberculosis: No; Never smoker; Marital Status - Married; Alcohol Use: Never; Drug Use: No History; Caffeine Use: Daily - tea, soda; Financial Concerns: No; Food, Clothing or Shelter Needs: No; Support System Lacking: No; Transportation Concerns: No Electronic Signature(s) Signed: 04/27/2023 11:42:07 AM By: [name] MD FACS Entered By: [name] on 04/27/2023 08:31:45 [name] ([id number]) [id number] Page 18 of 18 -------------------------------------------------------------------------------- SuperBill Details Patient Name: Date of Service: [name], TO [name] 04/27/2023 Medical Record Number: [id number] Patient Account Number: [id number] Date of Birth/Sex: Treating RN: 1976-01-25 (47 y.o. 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 Facility Procedures : CPT4 Code: 93235573 Description: 97597 - DEBRIDE WOUND 1ST 20 SQ CM OR < ICD-10 Diagnosis Description L97.522 Non-pressure chronic ulcer of other part of left foot with fat layer exposed Modifier: Quantity: 1 Physician Procedures : CPT4 Code Description Modifier [id number] 99214 - WC PHYS LEVEL 4 - EST PT 25 ICD-10 Diagnosis Description L97.512 Non-pressure chronic ulcer of other part of right foot with fat layer exposed L97.522 Non-pressure chronic ulcer of other part of left foot  with fat layer exposed Quantity: 1 : [id number] - WC PHYS DEBR WO ANESTH 20 SQ CM ICD-10  Diagnosis Description L97.522 Non-pressure chronic ulcer of other part of left foot with fat layer exposed Quantity: 1 Electronic Signature(s) Signed: 04/27/2023 11:37:43 AM By: [name] MD FACS Entered By: [name] on 04/27/2023 08:37:42  [id number] Page 17 of 18 Negative for: Cirrhosis ; Colitis; Crohns; Hepatitis A; Hepatitis B; Hepatitis C Endocrine Medical History: Negative for: Type I Diabetes; Type II Diabetes Genitourinary Medical History: Negative for: End Stage Renal Disease Past Medical History Notes: kidney stone Immunological Medical History: Negative for: Lupus Erythematosus; Raynauds; Scleroderma Integumentary (Skin) Medical History: Negative for: History of Burn Musculoskeletal Medical History: Negative for: Gout; Rheumatoid Arthritis; Osteoarthritis; Osteomyelitis Neurologic Medical History: Negative for: Dementia; Neuropathy; Quadriplegia; Paraplegia; Seizure Disorder Oncologic Medical History: Negative for: Received Chemotherapy; Received Radiation Psychiatric Medical History: Negative for: Anorexia/bulimia; Confinement Anxiety Past Medical History Notes: anxiety Immunizations Pneumococcal Vaccine: Received Pneumococcal Vaccination: No Implantable Devices None Hospitalization / Surgery History Type of Hospitalization/Surgery COVID PNA 07/22/2019- 11/14/2019 03/27/2020 wound debridement/ skin graft Family and Social History Cancer: Yes - Maternal Grandparents; Diabetes: Yes - Father,Paternal Grandparents; Heart Disease: Yes - Maternal  Grandparents; Hereditary Spherocytosis: No; Hypertension: Yes - Father,Paternal Grandparents; Kidney Disease: No; Lung Disease: Yes - Siblings; Seizures: No; Stroke: No; Thyroid Problems: No; Tuberculosis: No; Never smoker; Marital Status - Married; Alcohol Use: Never; Drug Use: No History; Caffeine Use: Daily - tea, soda; Financial Concerns: No; Food, Clothing or Shelter Needs: No; Support System Lacking: No; Transportation Concerns: No Electronic Signature(s) Signed: 04/27/2023 11:42:07 AM By: [name] MD FACS Entered By: [name] on 04/27/2023 08:31:45 [name] ([id number]) [id number] Page 18 of 18 -------------------------------------------------------------------------------- SuperBill Details Patient Name: Date of Service: [name], TO [name] 04/27/2023 Medical Record Number: [id number] Patient Account Number: [id number] Date of Birth/Sex: Treating RN: 1976-01-25 (47 y.o. 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 Facility Procedures : CPT4 Code: 93235573 Description: 97597 - DEBRIDE WOUND 1ST 20 SQ CM OR < ICD-10 Diagnosis Description L97.522 Non-pressure chronic ulcer of other part of left foot with fat layer exposed Modifier: Quantity: 1 Physician Procedures : CPT4 Code Description Modifier [id number] 99214 - WC PHYS LEVEL 4 - EST PT 25 ICD-10 Diagnosis Description L97.512 Non-pressure chronic ulcer of other part of right foot with fat layer exposed L97.522 Non-pressure chronic ulcer of other part of left foot  with fat layer exposed Quantity: 1 : [id number] 97597 - WC PHYS DEBR WO ANESTH 20 SQ CM ICD-10  Diagnosis Description L97.522 Non-pressure chronic ulcer of other part of left foot with fat layer exposed Quantity: 1 Electronic Signature(s) Signed: 04/27/2023 11:37:43 AM By: [name] MD FACS Entered By: [name] on 04/27/2023 08:37:42  [id number] Page 17 of 18 Negative for: Cirrhosis ; Colitis; Crohns; Hepatitis A; Hepatitis B; Hepatitis C Endocrine Medical History: Negative for: Type I Diabetes; Type II Diabetes Genitourinary Medical History: Negative for: End Stage Renal Disease Past Medical History Notes: kidney stone Immunological Medical History: Negative for: Lupus Erythematosus; Raynauds; Scleroderma Integumentary (Skin) Medical History: Negative for: History of Burn Musculoskeletal Medical History: Negative for: Gout; Rheumatoid Arthritis; Osteoarthritis; Osteomyelitis Neurologic Medical History: Negative for: Dementia; Neuropathy; Quadriplegia; Paraplegia; Seizure Disorder Oncologic Medical History: Negative for: Received Chemotherapy; Received Radiation Psychiatric Medical History: Negative for: Anorexia/bulimia; Confinement Anxiety Past Medical History Notes: anxiety Immunizations Pneumococcal Vaccine: Received Pneumococcal Vaccination: No Implantable Devices None Hospitalization / Surgery History Type of Hospitalization/Surgery COVID PNA 07/22/2019- 11/14/2019 03/27/2020 wound debridement/ skin graft Family and Social History Cancer: Yes - Maternal Grandparents; Diabetes: Yes - Father,Paternal Grandparents; Heart Disease: Yes - Maternal  Grandparents; Hereditary Spherocytosis: No; Hypertension: Yes - Father,Paternal Grandparents; Kidney Disease: No; Lung Disease: Yes - Siblings; Seizures: No; Stroke: No; Thyroid Problems: No; Tuberculosis: No; Never smoker; Marital Status - Married; Alcohol Use: Never; Drug Use: No History; Caffeine Use: Daily - tea, soda; Financial Concerns: No; Food, Clothing or Shelter Needs: No; Support System Lacking: No; Transportation Concerns: No Electronic Signature(s) Signed: 04/27/2023 11:42:07 AM By: [name] MD FACS Entered By: [name] on 04/27/2023 08:31:45 [name] ([id number]) [id number] Page 18 of 18 -------------------------------------------------------------------------------- SuperBill Details Patient Name: Date of Service: [name], TO [name] 04/27/2023 Medical Record Number: [id number] Patient Account Number: [id number] Date of Birth/Sex: Treating RN: 1976-01-25 (47 y.o. 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 Facility Procedures : CPT4 Code: 93235573 Description: 97597 - DEBRIDE WOUND 1ST 20 SQ CM OR < ICD-10 Diagnosis Description L97.522 Non-pressure chronic ulcer of other part of left foot with fat layer exposed Modifier: Quantity: 1 Physician Procedures : CPT4 Code Description Modifier [id number] 99214 - WC PHYS LEVEL 4 - EST PT 25 ICD-10 Diagnosis Description L97.512 Non-pressure chronic ulcer of other part of right foot with fat layer exposed L97.522 Non-pressure chronic ulcer of other part of left foot  with fat layer exposed Quantity: 1 : [id number] 97597 - WC PHYS DEBR WO ANESTH 20 SQ CM ICD-10  Diagnosis Description L97.522 Non-pressure chronic ulcer of other part of left foot with fat layer exposed Quantity: 1 Electronic Signature(s) Signed: 04/27/2023 11:37:43 AM By: [name] MD FACS Entered By: [name] on 04/27/2023 08:37:42  Sponge, Non-Sterile 4x4 in (Generic) 1 x Per Day/Other:until healed Discharge Instructions: Apply over primary dressing as directed. Secured With: 36M Medipore H Soft Cloth Surgical T ape, 4 x 10 (in/yd) (Generic) 1 x Per Day/Other:until healed Discharge Instructions: Secure with tape as directed. Compression Wrap: Kerlix Roll 4.5x3.1 (in/yd) (Generic) 1 x Per Day/Other:until healed Discharge Instructions: Apply Kerlix and Coban compression as directed. Add-Ons: [employer] Offloading Boot, Size Regular 1 x Per Day/Other:until healed Wound #5 - Foot Wound Laterality: Plantar, Right Topical: Gentamicin 1 x Per Day/30 Days Discharge Instructions: As directed by physician Prim Dressing: Maxorb Extra Ag+ Alginate Dressing, 4x4.75 (in/in) 1 x Per Day/30 Days ary Discharge Instructions: Apply to wound bed as instructed Secondary Dressing: Drawtex 4x4 in 1 x Per Day/30 Days Discharge Instructions: Apply over primary dressing as directed. Secondary Dressing: Optifoam  Non-Adhesive Dressing, 4x4 in 1 x Per Day/30 Days Discharge Instructions: Apply over primary dressing as directed. Secured With: [employer], 4.5x3.1 (in/yd) 1 x Per Day/30 Days Discharge Instructions: Secure with Kerlix as directed. Patient Medications llergies: No Known Drug Allergies A Notifications Medication Indication Start End 04/27/2023 gentamicin DOSE topical 0.1 % ointment - apply thin layer to wounds with dressing changes Electronic Signature(s) Signed: 04/27/2023 12:33:23 PM By: [name] MD FACS Signed: 04/27/2023 4:20:28 PM By: Brenton Grills Previous Signature: 04/27/2023 11:42:07 AM Version By: [name] MD FACS Previous Signature: 04/27/2023 11:34:47 AM Version By: [name] MD FACS Entered By: Brenton Grills on 04/27/2023 08:44:46 -------------------------------------------------------------------------------- Problem List Details Patient Name: Date of Service: [name], [name] 04/27/2023 11:00 A M Medical Record Number: [id number] Patient Account Number: [id number] Date of Birth/Sex: Treating RN: [birth year] (47 y.o. [name] Primary Care Provider: Dorinda Hill Other Clinician: Referring Provider: Treating Provider/Extender: Jana Hakim in Treatment: [phone] Active Problems ICD-10 Encounter Code Description Active Date MDM Diagnosis [name], [name] ([id number]) [phone].pdf Page 9 of 18 L97.522 Non-pressure chronic ulcer of other part of left foot with fat layer exposed 09/03/2020 No Yes L97.512 Non-pressure chronic ulcer of other part of right foot with fat layer exposed 04/27/2023 No Yes Inactive Problems ICD-10 Code Description Active Date Inactive Date L89.893 Pressure ulcer of other site, stage 3 07/15/2020 07/15/2020 M62.81 Muscle weakness (generalized) 07/15/2020 07/15/2020 I10 Essential (primary) hypertension 07/15/2020 07/15/2020 M86.171 Other acute osteomyelitis, right ankle and  foot 09/03/2020 09/03/2020 Resolved Problems Electronic Signature(s) Signed: 04/27/2023 11:28:10 AM By: [name] MD FACS Entered By: [name] on 04/27/2023 08:28:10 -------------------------------------------------------------------------------- Progress Note Details Patient Name: Date of Service: [name], [name] 04/27/2023 11:00 A M Medical Record Number: [id number] Patient Account Number: [id number] Date of Birth/Sex: Treating RN: 23-Sep-1975 (47 y.o. M) Primary Care Provider: Dorinda Hill Other Clinician: Referring Provider: Treating Provider/Extender: Jana Hakim in Treatment: 145 Subjective Chief Complaint Information obtained from Patient Bilateral Plantar Foot Ulcers History of Present Illness (HPI) Wounds are12/03/2020 upon evaluation today patient presents for initial inspection here in our clinic concerning issues he has been having with the bottoms of his feet bilaterally. He states these actually occurred as wounds when he was hospitalized for 5 months secondary to Covid. He was apparently with tilting bed where he was in an upright position quite frequently and apparently this occurred in some way shape or form during that time. Fortunately there is no sign of active infection at this time. No fevers, chills, nausea, vomiting, or diarrhea. With that being said he still has substantial wounds on the plantar aspects  Sponge, Non-Sterile 4x4 in (Generic) 1 x Per Day/Other:until healed Discharge Instructions: Apply over primary dressing as directed. Secured With: 36M Medipore H Soft Cloth Surgical T ape, 4 x 10 (in/yd) (Generic) 1 x Per Day/Other:until healed Discharge Instructions: Secure with tape as directed. Compression Wrap: Kerlix Roll 4.5x3.1 (in/yd) (Generic) 1 x Per Day/Other:until healed Discharge Instructions: Apply Kerlix and Coban compression as directed. Add-Ons: [employer] Offloading Boot, Size Regular 1 x Per Day/Other:until healed Wound #5 - Foot Wound Laterality: Plantar, Right Topical: Gentamicin 1 x Per Day/30 Days Discharge Instructions: As directed by physician Prim Dressing: Maxorb Extra Ag+ Alginate Dressing, 4x4.75 (in/in) 1 x Per Day/30 Days ary Discharge Instructions: Apply to wound bed as instructed Secondary Dressing: Drawtex 4x4 in 1 x Per Day/30 Days Discharge Instructions: Apply over primary dressing as directed. Secondary Dressing: Optifoam  Non-Adhesive Dressing, 4x4 in 1 x Per Day/30 Days Discharge Instructions: Apply over primary dressing as directed. Secured With: [employer], 4.5x3.1 (in/yd) 1 x Per Day/30 Days Discharge Instructions: Secure with Kerlix as directed. Patient Medications llergies: No Known Drug Allergies A Notifications Medication Indication Start End 04/27/2023 gentamicin DOSE topical 0.1 % ointment - apply thin layer to wounds with dressing changes Electronic Signature(s) Signed: 04/27/2023 12:33:23 PM By: [name] MD FACS Signed: 04/27/2023 4:20:28 PM By: Brenton Grills Previous Signature: 04/27/2023 11:42:07 AM Version By: [name] MD FACS Previous Signature: 04/27/2023 11:34:47 AM Version By: [name] MD FACS Entered By: Brenton Grills on 04/27/2023 08:44:46 -------------------------------------------------------------------------------- Problem List Details Patient Name: Date of Service: [name], [name] 04/27/2023 11:00 A M Medical Record Number: [id number] Patient Account Number: [id number] Date of Birth/Sex: Treating RN: [birth year] (47 y.o. [name] Primary Care Provider: Dorinda Hill Other Clinician: Referring Provider: Treating Provider/Extender: Jana Hakim in Treatment: [phone] Active Problems ICD-10 Encounter Code Description Active Date MDM Diagnosis [name], [name] ([id number]) [phone].pdf Page 9 of 18 L97.522 Non-pressure chronic ulcer of other part of left foot with fat layer exposed 09/03/2020 No Yes L97.512 Non-pressure chronic ulcer of other part of right foot with fat layer exposed 04/27/2023 No Yes Inactive Problems ICD-10 Code Description Active Date Inactive Date L89.893 Pressure ulcer of other site, stage 3 07/15/2020 07/15/2020 M62.81 Muscle weakness (generalized) 07/15/2020 07/15/2020 I10 Essential (primary) hypertension 07/15/2020 07/15/2020 M86.171 Other acute osteomyelitis, right ankle and  foot 09/03/2020 09/03/2020 Resolved Problems Electronic Signature(s) Signed: 04/27/2023 11:28:10 AM By: [name] MD FACS Entered By: [name] on 04/27/2023 08:28:10 -------------------------------------------------------------------------------- Progress Note Details Patient Name: Date of Service: [name], [name] 04/27/2023 11:00 A M Medical Record Number: [id number] Patient Account Number: [id number] Date of Birth/Sex: Treating RN: 23-Sep-1975 (47 y.o. M) Primary Care Provider: Dorinda Hill Other Clinician: Referring Provider: Treating Provider/Extender: Jana Hakim in Treatment: 145 Subjective Chief Complaint Information obtained from Patient Bilateral Plantar Foot Ulcers History of Present Illness (HPI) Wounds are12/03/2020 upon evaluation today patient presents for initial inspection here in our clinic concerning issues he has been having with the bottoms of his feet bilaterally. He states these actually occurred as wounds when he was hospitalized for 5 months secondary to Covid. He was apparently with tilting bed where he was in an upright position quite frequently and apparently this occurred in some way shape or form during that time. Fortunately there is no sign of active infection at this time. No fevers, chills, nausea, vomiting, or diarrhea. With that being said he still has substantial wounds on the plantar aspects  Sponge, Non-Sterile 4x4 in (Generic) 1 x Per Day/Other:until healed Discharge Instructions: Apply over primary dressing as directed. Secured With: 36M Medipore H Soft Cloth Surgical T ape, 4 x 10 (in/yd) (Generic) 1 x Per Day/Other:until healed Discharge Instructions: Secure with tape as directed. Compression Wrap: Kerlix Roll 4.5x3.1 (in/yd) (Generic) 1 x Per Day/Other:until healed Discharge Instructions: Apply Kerlix and Coban compression as directed. Add-Ons: [employer] Offloading Boot, Size Regular 1 x Per Day/Other:until healed Wound #5 - Foot Wound Laterality: Plantar, Right Topical: Gentamicin 1 x Per Day/30 Days Discharge Instructions: As directed by physician Prim Dressing: Maxorb Extra Ag+ Alginate Dressing, 4x4.75 (in/in) 1 x Per Day/30 Days ary Discharge Instructions: Apply to wound bed as instructed Secondary Dressing: Drawtex 4x4 in 1 x Per Day/30 Days Discharge Instructions: Apply over primary dressing as directed. Secondary Dressing: Optifoam  Non-Adhesive Dressing, 4x4 in 1 x Per Day/30 Days Discharge Instructions: Apply over primary dressing as directed. Secured With: [employer], 4.5x3.1 (in/yd) 1 x Per Day/30 Days Discharge Instructions: Secure with Kerlix as directed. Patient Medications llergies: No Known Drug Allergies A Notifications Medication Indication Start End 04/27/2023 gentamicin DOSE topical 0.1 % ointment - apply thin layer to wounds with dressing changes Electronic Signature(s) Signed: 04/27/2023 12:33:23 PM By: [name] MD FACS Signed: 04/27/2023 4:20:28 PM By: Brenton Grills Previous Signature: 04/27/2023 11:42:07 AM Version By: [name] MD FACS Previous Signature: 04/27/2023 11:34:47 AM Version By: [name] MD FACS Entered By: Brenton Grills on 04/27/2023 08:44:46 -------------------------------------------------------------------------------- Problem List Details Patient Name: Date of Service: [name], [name] 04/27/2023 11:00 A M Medical Record Number: [id number] Patient Account Number: [id number] Date of Birth/Sex: Treating RN: [birth year] (47 y.o. [name] Primary Care Provider: Dorinda Hill Other Clinician: Referring Provider: Treating Provider/Extender: Jana Hakim in Treatment: [phone] Active Problems ICD-10 Encounter Code Description Active Date MDM Diagnosis [name], [name] ([id number]) [phone].pdf Page 9 of 18 L97.522 Non-pressure chronic ulcer of other part of left foot with fat layer exposed 09/03/2020 No Yes L97.512 Non-pressure chronic ulcer of other part of right foot with fat layer exposed 04/27/2023 No Yes Inactive Problems ICD-10 Code Description Active Date Inactive Date L89.893 Pressure ulcer of other site, stage 3 07/15/2020 07/15/2020 M62.81 Muscle weakness (generalized) 07/15/2020 07/15/2020 I10 Essential (primary) hypertension 07/15/2020 07/15/2020 M86.171 Other acute osteomyelitis, right ankle and  foot 09/03/2020 09/03/2020 Resolved Problems Electronic Signature(s) Signed: 04/27/2023 11:28:10 AM By: [name] MD FACS Entered By: [name] on 04/27/2023 08:28:10 -------------------------------------------------------------------------------- Progress Note Details Patient Name: Date of Service: [name], [name] 04/27/2023 11:00 A M Medical Record Number: [id number] Patient Account Number: [id number] Date of Birth/Sex: Treating RN: 23-Sep-1975 (47 y.o. M) Primary Care Provider: Dorinda Hill Other Clinician: Referring Provider: Treating Provider/Extender: Jana Hakim in Treatment: 145 Subjective Chief Complaint Information obtained from Patient Bilateral Plantar Foot Ulcers History of Present Illness (HPI) Wounds are12/03/2020 upon evaluation today patient presents for initial inspection here in our clinic concerning issues he has been having with the bottoms of his feet bilaterally. He states these actually occurred as wounds when he was hospitalized for 5 months secondary to Covid. He was apparently with tilting bed where he was in an upright position quite frequently and apparently this occurred in some way shape or form during that time. Fortunately there is no sign of active infection at this time. No fevers, chills, nausea, vomiting, or diarrhea. With that being said he still has substantial wounds on the plantar aspects  [id number] Page 17 of 18 Negative for: Cirrhosis ; Colitis; Crohns; Hepatitis A; Hepatitis B; Hepatitis C Endocrine Medical History: Negative for: Type I Diabetes; Type II Diabetes Genitourinary Medical History: Negative for: End Stage Renal Disease Past Medical History Notes: kidney stone Immunological Medical History: Negative for: Lupus Erythematosus; Raynauds; Scleroderma Integumentary (Skin) Medical History: Negative for: History of Burn Musculoskeletal Medical History: Negative for: Gout; Rheumatoid Arthritis; Osteoarthritis; Osteomyelitis Neurologic Medical History: Negative for: Dementia; Neuropathy; Quadriplegia; Paraplegia; Seizure Disorder Oncologic Medical History: Negative for: Received Chemotherapy; Received Radiation Psychiatric Medical History: Negative for: Anorexia/bulimia; Confinement Anxiety Past Medical History Notes: anxiety Immunizations Pneumococcal Vaccine: Received Pneumococcal Vaccination: No Implantable Devices None Hospitalization / Surgery History Type of Hospitalization/Surgery COVID PNA 07/22/2019- 11/14/2019 03/27/2020 wound debridement/ skin graft Family and Social History Cancer: Yes - Maternal Grandparents; Diabetes: Yes - Father,Paternal Grandparents; Heart Disease: Yes - Maternal  Grandparents; Hereditary Spherocytosis: No; Hypertension: Yes - Father,Paternal Grandparents; Kidney Disease: No; Lung Disease: Yes - Siblings; Seizures: No; Stroke: No; Thyroid Problems: No; Tuberculosis: No; Never smoker; Marital Status - Married; Alcohol Use: Never; Drug Use: No History; Caffeine Use: Daily - tea, soda; Financial Concerns: No; Food, Clothing or Shelter Needs: No; Support System Lacking: No; Transportation Concerns: No Electronic Signature(s) Signed: 04/27/2023 11:42:07 AM By: [name] MD FACS Entered By: [name] on 04/27/2023 08:31:45 [name] ([id number]) [id number] Page 18 of 18 -------------------------------------------------------------------------------- SuperBill Details Patient Name: Date of Service: [name], TO [name] 04/27/2023 Medical Record Number: [id number] Patient Account Number: [id number] Date of Birth/Sex: Treating RN: 1976-01-25 (47 y.o. 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 Facility Procedures : CPT4 Code: 93235573 Description: 97597 - DEBRIDE WOUND 1ST 20 SQ CM OR < ICD-10 Diagnosis Description L97.522 Non-pressure chronic ulcer of other part of left foot with fat layer exposed Modifier: Quantity: 1 Physician Procedures : CPT4 Code Description Modifier [id number] 99214 - WC PHYS LEVEL 4 - EST PT 25 ICD-10 Diagnosis Description L97.512 Non-pressure chronic ulcer of other part of right foot with fat layer exposed L97.522 Non-pressure chronic ulcer of other part of left foot  with fat layer exposed Quantity: 1 : [id number] 97597 - WC PHYS DEBR WO ANESTH 20 SQ CM ICD-10  Diagnosis Description L97.522 Non-pressure chronic ulcer of other part of left foot with fat layer exposed Quantity: 1 Electronic Signature(s) Signed: 04/27/2023 11:37:43 AM By: [name] MD FACS Entered By: [name] on 04/27/2023 08:37:42  [id number] Page 17 of 18 Negative for: Cirrhosis ; Colitis; Crohns; Hepatitis A; Hepatitis B; Hepatitis C Endocrine Medical History: Negative for: Type I Diabetes; Type II Diabetes Genitourinary Medical History: Negative for: End Stage Renal Disease Past Medical History Notes: kidney stone Immunological Medical History: Negative for: Lupus Erythematosus; Raynauds; Scleroderma Integumentary (Skin) Medical History: Negative for: History of Burn Musculoskeletal Medical History: Negative for: Gout; Rheumatoid Arthritis; Osteoarthritis; Osteomyelitis Neurologic Medical History: Negative for: Dementia; Neuropathy; Quadriplegia; Paraplegia; Seizure Disorder Oncologic Medical History: Negative for: Received Chemotherapy; Received Radiation Psychiatric Medical History: Negative for: Anorexia/bulimia; Confinement Anxiety Past Medical History Notes: anxiety Immunizations Pneumococcal Vaccine: Received Pneumococcal Vaccination: No Implantable Devices None Hospitalization / Surgery History Type of Hospitalization/Surgery COVID PNA 07/22/2019- 11/14/2019 03/27/2020 wound debridement/ skin graft Family and Social History Cancer: Yes - Maternal Grandparents; Diabetes: Yes - Father,Paternal Grandparents; Heart Disease: Yes - Maternal  Grandparents; Hereditary Spherocytosis: No; Hypertension: Yes - Father,Paternal Grandparents; Kidney Disease: No; Lung Disease: Yes - Siblings; Seizures: No; Stroke: No; Thyroid Problems: No; Tuberculosis: No; Never smoker; Marital Status - Married; Alcohol Use: Never; Drug Use: No History; Caffeine Use: Daily - tea, soda; Financial Concerns: No; Food, Clothing or Shelter Needs: No; Support System Lacking: No; Transportation Concerns: No Electronic Signature(s) Signed: 04/27/2023 11:42:07 AM By: [name] MD FACS Entered By: [name] on 04/27/2023 08:31:45 [name] ([id number]) [id number] Page 18 of 18 -------------------------------------------------------------------------------- SuperBill Details Patient Name: Date of Service: [name], TO [name] 04/27/2023 Medical Record Number: [id number] Patient Account Number: [id number] Date of Birth/Sex: Treating RN: 1976-01-25 (47 y.o. 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 Facility Procedures : CPT4 Code: 93235573 Description: 97597 - DEBRIDE WOUND 1ST 20 SQ CM OR < ICD-10 Diagnosis Description L97.522 Non-pressure chronic ulcer of other part of left foot with fat layer exposed Modifier: Quantity: 1 Physician Procedures : CPT4 Code Description Modifier [id number] 99214 - WC PHYS LEVEL 4 - EST PT 25 ICD-10 Diagnosis Description L97.512 Non-pressure chronic ulcer of other part of right foot with fat layer exposed L97.522 Non-pressure chronic ulcer of other part of left foot  with fat layer exposed Quantity: 1 : [id number] 97597 - WC PHYS DEBR WO ANESTH 20 SQ CM ICD-10  Diagnosis Description L97.522 Non-pressure chronic ulcer of other part of left foot with fat layer exposed Quantity: 1 Electronic Signature(s) Signed: 04/27/2023 11:37:43 AM By: [name] MD FACS Entered By: [name] on 04/27/2023 08:37:42  Sponge, Non-Sterile 4x4 in (Generic) 1 x Per Day/Other:until healed Discharge Instructions: Apply over primary dressing as directed. Secured With: 36M Medipore H Soft Cloth Surgical T ape, 4 x 10 (in/yd) (Generic) 1 x Per Day/Other:until healed Discharge Instructions: Secure with tape as directed. Compression Wrap: Kerlix Roll 4.5x3.1 (in/yd) (Generic) 1 x Per Day/Other:until healed Discharge Instructions: Apply Kerlix and Coban compression as directed. Add-Ons: [employer] Offloading Boot, Size Regular 1 x Per Day/Other:until healed Wound #5 - Foot Wound Laterality: Plantar, Right Topical: Gentamicin 1 x Per Day/30 Days Discharge Instructions: As directed by physician Prim Dressing: Maxorb Extra Ag+ Alginate Dressing, 4x4.75 (in/in) 1 x Per Day/30 Days ary Discharge Instructions: Apply to wound bed as instructed Secondary Dressing: Drawtex 4x4 in 1 x Per Day/30 Days Discharge Instructions: Apply over primary dressing as directed. Secondary Dressing: Optifoam  Non-Adhesive Dressing, 4x4 in 1 x Per Day/30 Days Discharge Instructions: Apply over primary dressing as directed. Secured With: [employer], 4.5x3.1 (in/yd) 1 x Per Day/30 Days Discharge Instructions: Secure with Kerlix as directed. Patient Medications llergies: No Known Drug Allergies A Notifications Medication Indication Start End 04/27/2023 gentamicin DOSE topical 0.1 % ointment - apply thin layer to wounds with dressing changes Electronic Signature(s) Signed: 04/27/2023 12:33:23 PM By: [name] MD FACS Signed: 04/27/2023 4:20:28 PM By: Brenton Grills Previous Signature: 04/27/2023 11:42:07 AM Version By: [name] MD FACS Previous Signature: 04/27/2023 11:34:47 AM Version By: [name] MD FACS Entered By: Brenton Grills on 04/27/2023 08:44:46 -------------------------------------------------------------------------------- Problem List Details Patient Name: Date of Service: [name], [name] 04/27/2023 11:00 A M Medical Record Number: [id number] Patient Account Number: [id number] Date of Birth/Sex: Treating RN: [birth year] (47 y.o. [name] Primary Care Provider: Dorinda Hill Other Clinician: Referring Provider: Treating Provider/Extender: Jana Hakim in Treatment: [phone] Active Problems ICD-10 Encounter Code Description Active Date MDM Diagnosis [name], [name] ([id number]) [phone].pdf Page 9 of 18 L97.522 Non-pressure chronic ulcer of other part of left foot with fat layer exposed 09/03/2020 No Yes L97.512 Non-pressure chronic ulcer of other part of right foot with fat layer exposed 04/27/2023 No Yes Inactive Problems ICD-10 Code Description Active Date Inactive Date L89.893 Pressure ulcer of other site, stage 3 07/15/2020 07/15/2020 M62.81 Muscle weakness (generalized) 07/15/2020 07/15/2020 I10 Essential (primary) hypertension 07/15/2020 07/15/2020 M86.171 Other acute osteomyelitis, right ankle and  foot 09/03/2020 09/03/2020 Resolved Problems Electronic Signature(s) Signed: 04/27/2023 11:28:10 AM By: [name] MD FACS Entered By: [name] on 04/27/2023 08:28:10 -------------------------------------------------------------------------------- Progress Note Details Patient Name: Date of Service: [name], [name] 04/27/2023 11:00 A M Medical Record Number: [id number] Patient Account Number: [id number] Date of Birth/Sex: Treating RN: 23-Sep-1975 (47 y.o. M) Primary Care Provider: Dorinda Hill Other Clinician: Referring Provider: Treating Provider/Extender: Jana Hakim in Treatment: 145 Subjective Chief Complaint Information obtained from Patient Bilateral Plantar Foot Ulcers History of Present Illness (HPI) Wounds are12/03/2020 upon evaluation today patient presents for initial inspection here in our clinic concerning issues he has been having with the bottoms of his feet bilaterally. He states these actually occurred as wounds when he was hospitalized for 5 months secondary to Covid. He was apparently with tilting bed where he was in an upright position quite frequently and apparently this occurred in some way shape or form during that time. Fortunately there is no sign of active infection at this time. No fevers, chills, nausea, vomiting, or diarrhea. With that being said he still has substantial wounds on the plantar aspects  [id number] Page 17 of 18 Negative for: Cirrhosis ; Colitis; Crohns; Hepatitis A; Hepatitis B; Hepatitis C Endocrine Medical History: Negative for: Type I Diabetes; Type II Diabetes Genitourinary Medical History: Negative for: End Stage Renal Disease Past Medical History Notes: kidney stone Immunological Medical History: Negative for: Lupus Erythematosus; Raynauds; Scleroderma Integumentary (Skin) Medical History: Negative for: History of Burn Musculoskeletal Medical History: Negative for: Gout; Rheumatoid Arthritis; Osteoarthritis; Osteomyelitis Neurologic Medical History: Negative for: Dementia; Neuropathy; Quadriplegia; Paraplegia; Seizure Disorder Oncologic Medical History: Negative for: Received Chemotherapy; Received Radiation Psychiatric Medical History: Negative for: Anorexia/bulimia; Confinement Anxiety Past Medical History Notes: anxiety Immunizations Pneumococcal Vaccine: Received Pneumococcal Vaccination: No Implantable Devices None Hospitalization / Surgery History Type of Hospitalization/Surgery COVID PNA 07/22/2019- 11/14/2019 03/27/2020 wound debridement/ skin graft Family and Social History Cancer: Yes - Maternal Grandparents; Diabetes: Yes - Father,Paternal Grandparents; Heart Disease: Yes - Maternal  Grandparents; Hereditary Spherocytosis: No; Hypertension: Yes - Father,Paternal Grandparents; Kidney Disease: No; Lung Disease: Yes - Siblings; Seizures: No; Stroke: No; Thyroid Problems: No; Tuberculosis: No; Never smoker; Marital Status - Married; Alcohol Use: Never; Drug Use: No History; Caffeine Use: Daily - tea, soda; Financial Concerns: No; Food, Clothing or Shelter Needs: No; Support System Lacking: No; Transportation Concerns: No Electronic Signature(s) Signed: 04/27/2023 11:42:07 AM By: [name] MD FACS Entered By: [name] on 04/27/2023 08:31:45 [name] ([id number]) [id number] Page 18 of 18 -------------------------------------------------------------------------------- SuperBill Details Patient Name: Date of Service: [name], TO [name] 04/27/2023 Medical Record Number: [id number] Patient Account Number: [id number] Date of Birth/Sex: Treating RN: 1976-01-25 (47 y.o. 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 Facility Procedures : CPT4 Code: 93235573 Description: 97597 - DEBRIDE WOUND 1ST 20 SQ CM OR < ICD-10 Diagnosis Description L97.522 Non-pressure chronic ulcer of other part of left foot with fat layer exposed Modifier: Quantity: 1 Physician Procedures : CPT4 Code Description Modifier [id number] 99214 - WC PHYS LEVEL 4 - EST PT 25 ICD-10 Diagnosis Description L97.512 Non-pressure chronic ulcer of other part of right foot with fat layer exposed L97.522 Non-pressure chronic ulcer of other part of left foot  with fat layer exposed Quantity: 1 : [id number] 97597 - WC PHYS DEBR WO ANESTH 20 SQ CM ICD-10  Diagnosis Description L97.522 Non-pressure chronic ulcer of other part of left foot with fat layer exposed Quantity: 1 Electronic Signature(s) Signed: 04/27/2023 11:37:43 AM By: [name] MD FACS Entered By: [name] on 04/27/2023 08:37:42  [id number] Page 17 of 18 Negative for: Cirrhosis ; Colitis; Crohns; Hepatitis A; Hepatitis B; Hepatitis C Endocrine Medical History: Negative for: Type I Diabetes; Type II Diabetes Genitourinary Medical History: Negative for: End Stage Renal Disease Past Medical History Notes: kidney stone Immunological Medical History: Negative for: Lupus Erythematosus; Raynauds; Scleroderma Integumentary (Skin) Medical History: Negative for: History of Burn Musculoskeletal Medical History: Negative for: Gout; Rheumatoid Arthritis; Osteoarthritis; Osteomyelitis Neurologic Medical History: Negative for: Dementia; Neuropathy; Quadriplegia; Paraplegia; Seizure Disorder Oncologic Medical History: Negative for: Received Chemotherapy; Received Radiation Psychiatric Medical History: Negative for: Anorexia/bulimia; Confinement Anxiety Past Medical History Notes: anxiety Immunizations Pneumococcal Vaccine: Received Pneumococcal Vaccination: No Implantable Devices None Hospitalization / Surgery History Type of Hospitalization/Surgery COVID PNA 07/22/2019- 11/14/2019 03/27/2020 wound debridement/ skin graft Family and Social History Cancer: Yes - Maternal Grandparents; Diabetes: Yes - Father,Paternal Grandparents; Heart Disease: Yes - Maternal  Grandparents; Hereditary Spherocytosis: No; Hypertension: Yes - Father,Paternal Grandparents; Kidney Disease: No; Lung Disease: Yes - Siblings; Seizures: No; Stroke: No; Thyroid Problems: No; Tuberculosis: No; Never smoker; Marital Status - Married; Alcohol Use: Never; Drug Use: No History; Caffeine Use: Daily - tea, soda; Financial Concerns: No; Food, Clothing or Shelter Needs: No; Support System Lacking: No; Transportation Concerns: No Electronic Signature(s) Signed: 04/27/2023 11:42:07 AM By: [name] MD FACS Entered By: [name] on 04/27/2023 08:31:45 [name] ([id number]) [id number] Page 18 of 18 -------------------------------------------------------------------------------- SuperBill Details Patient Name: Date of Service: [name], TO [name] 04/27/2023 Medical Record Number: [id number] Patient Account Number: [id number] Date of Birth/Sex: Treating RN: 1976-01-25 (47 y.o. 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 Facility Procedures : CPT4 Code: 93235573 Description: 97597 - DEBRIDE WOUND 1ST 20 SQ CM OR < ICD-10 Diagnosis Description L97.522 Non-pressure chronic ulcer of other part of left foot with fat layer exposed Modifier: Quantity: 1 Physician Procedures : CPT4 Code Description Modifier [id number] 99214 - WC PHYS LEVEL 4 - EST PT 25 ICD-10 Diagnosis Description L97.512 Non-pressure chronic ulcer of other part of right foot with fat layer exposed L97.522 Non-pressure chronic ulcer of other part of left foot  with fat layer exposed Quantity: 1 : [id number] 97597 - WC PHYS DEBR WO ANESTH 20 SQ CM ICD-10  Diagnosis Description L97.522 Non-pressure chronic ulcer of other part of left foot with fat layer exposed Quantity: 1 Electronic Signature(s) Signed: 04/27/2023 11:37:43 AM By: [name] MD FACS Entered By: [name] on 04/27/2023 08:37:42  Sponge, Non-Sterile 4x4 in (Generic) 1 x Per Day/Other:until healed Discharge Instructions: Apply over primary dressing as directed. Secured With: 36M Medipore H Soft Cloth Surgical T ape, 4 x 10 (in/yd) (Generic) 1 x Per Day/Other:until healed Discharge Instructions: Secure with tape as directed. Compression Wrap: Kerlix Roll 4.5x3.1 (in/yd) (Generic) 1 x Per Day/Other:until healed Discharge Instructions: Apply Kerlix and Coban compression as directed. Add-Ons: [employer] Offloading Boot, Size Regular 1 x Per Day/Other:until healed Wound #5 - Foot Wound Laterality: Plantar, Right Topical: Gentamicin 1 x Per Day/30 Days Discharge Instructions: As directed by physician Prim Dressing: Maxorb Extra Ag+ Alginate Dressing, 4x4.75 (in/in) 1 x Per Day/30 Days ary Discharge Instructions: Apply to wound bed as instructed Secondary Dressing: Drawtex 4x4 in 1 x Per Day/30 Days Discharge Instructions: Apply over primary dressing as directed. Secondary Dressing: Optifoam  Non-Adhesive Dressing, 4x4 in 1 x Per Day/30 Days Discharge Instructions: Apply over primary dressing as directed. Secured With: [employer], 4.5x3.1 (in/yd) 1 x Per Day/30 Days Discharge Instructions: Secure with Kerlix as directed. Patient Medications llergies: No Known Drug Allergies A Notifications Medication Indication Start End 04/27/2023 gentamicin DOSE topical 0.1 % ointment - apply thin layer to wounds with dressing changes Electronic Signature(s) Signed: 04/27/2023 12:33:23 PM By: [name] MD FACS Signed: 04/27/2023 4:20:28 PM By: Brenton Grills Previous Signature: 04/27/2023 11:42:07 AM Version By: [name] MD FACS Previous Signature: 04/27/2023 11:34:47 AM Version By: [name] MD FACS Entered By: Brenton Grills on 04/27/2023 08:44:46 -------------------------------------------------------------------------------- Problem List Details Patient Name: Date of Service: [name], [name] 04/27/2023 11:00 A M Medical Record Number: [id number] Patient Account Number: [id number] Date of Birth/Sex: Treating RN: [birth year] (47 y.o. [name] Primary Care Provider: Dorinda Hill Other Clinician: Referring Provider: Treating Provider/Extender: Jana Hakim in Treatment: [phone] Active Problems ICD-10 Encounter Code Description Active Date MDM Diagnosis [name], [name] ([id number]) [phone].pdf Page 9 of 18 L97.522 Non-pressure chronic ulcer of other part of left foot with fat layer exposed 09/03/2020 No Yes L97.512 Non-pressure chronic ulcer of other part of right foot with fat layer exposed 04/27/2023 No Yes Inactive Problems ICD-10 Code Description Active Date Inactive Date L89.893 Pressure ulcer of other site, stage 3 07/15/2020 07/15/2020 M62.81 Muscle weakness (generalized) 07/15/2020 07/15/2020 I10 Essential (primary) hypertension 07/15/2020 07/15/2020 M86.171 Other acute osteomyelitis, right ankle and  foot 09/03/2020 09/03/2020 Resolved Problems Electronic Signature(s) Signed: 04/27/2023 11:28:10 AM By: [name] MD FACS Entered By: [name] on 04/27/2023 08:28:10 -------------------------------------------------------------------------------- Progress Note Details Patient Name: Date of Service: [name], [name] 04/27/2023 11:00 A M Medical Record Number: [id number] Patient Account Number: [id number] Date of Birth/Sex: Treating RN: 23-Sep-1975 (47 y.o. M) Primary Care Provider: Dorinda Hill Other Clinician: Referring Provider: Treating Provider/Extender: Jana Hakim in Treatment: 145 Subjective Chief Complaint Information obtained from Patient Bilateral Plantar Foot Ulcers History of Present Illness (HPI) Wounds are12/03/2020 upon evaluation today patient presents for initial inspection here in our clinic concerning issues he has been having with the bottoms of his feet bilaterally. He states these actually occurred as wounds when he was hospitalized for 5 months secondary to Covid. He was apparently with tilting bed where he was in an upright position quite frequently and apparently this occurred in some way shape or form during that time. Fortunately there is no sign of active infection at this time. No fevers, chills, nausea, vomiting, or diarrhea. With that being said he still has substantial wounds on the plantar aspects  [id number] Page 17 of 18 Negative for: Cirrhosis ; Colitis; Crohns; Hepatitis A; Hepatitis B; Hepatitis C Endocrine Medical History: Negative for: Type I Diabetes; Type II Diabetes Genitourinary Medical History: Negative for: End Stage Renal Disease Past Medical History Notes: kidney stone Immunological Medical History: Negative for: Lupus Erythematosus; Raynauds; Scleroderma Integumentary (Skin) Medical History: Negative for: History of Burn Musculoskeletal Medical History: Negative for: Gout; Rheumatoid Arthritis; Osteoarthritis; Osteomyelitis Neurologic Medical History: Negative for: Dementia; Neuropathy; Quadriplegia; Paraplegia; Seizure Disorder Oncologic Medical History: Negative for: Received Chemotherapy; Received Radiation Psychiatric Medical History: Negative for: Anorexia/bulimia; Confinement Anxiety Past Medical History Notes: anxiety Immunizations Pneumococcal Vaccine: Received Pneumococcal Vaccination: No Implantable Devices None Hospitalization / Surgery History Type of Hospitalization/Surgery COVID PNA 07/22/2019- 11/14/2019 03/27/2020 wound debridement/ skin graft Family and Social History Cancer: Yes - Maternal Grandparents; Diabetes: Yes - Father,Paternal Grandparents; Heart Disease: Yes - Maternal  Grandparents; Hereditary Spherocytosis: No; Hypertension: Yes - Father,Paternal Grandparents; Kidney Disease: No; Lung Disease: Yes - Siblings; Seizures: No; Stroke: No; Thyroid Problems: No; Tuberculosis: No; Never smoker; Marital Status - Married; Alcohol Use: Never; Drug Use: No History; Caffeine Use: Daily - tea, soda; Financial Concerns: No; Food, Clothing or Shelter Needs: No; Support System Lacking: No; Transportation Concerns: No Electronic Signature(s) Signed: 04/27/2023 11:42:07 AM By: [name] MD FACS Entered By: [name] on 04/27/2023 08:31:45 [name] ([id number]) [id number] Page 18 of 18 -------------------------------------------------------------------------------- SuperBill Details Patient Name: Date of Service: [name], TO [name] 04/27/2023 Medical Record Number: [id number] Patient Account Number: [id number] Date of Birth/Sex: Treating RN: 1976-01-25 (47 y.o. 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 Facility Procedures : CPT4 Code: 93235573 Description: 97597 - DEBRIDE WOUND 1ST 20 SQ CM OR < ICD-10 Diagnosis Description L97.522 Non-pressure chronic ulcer of other part of left foot with fat layer exposed Modifier: Quantity: 1 Physician Procedures : CPT4 Code Description Modifier [id number] 99214 - WC PHYS LEVEL 4 - EST PT 25 ICD-10 Diagnosis Description L97.512 Non-pressure chronic ulcer of other part of right foot with fat layer exposed L97.522 Non-pressure chronic ulcer of other part of left foot  with fat layer exposed Quantity: 1 : [id number] 97597 - WC PHYS DEBR WO ANESTH 20 SQ CM ICD-10  Diagnosis Description L97.522 Non-pressure chronic ulcer of other part of left foot with fat layer exposed Quantity: 1 Electronic Signature(s) Signed: 04/27/2023 11:37:43 AM By: [name] MD FACS Entered By: [name] on 04/27/2023 08:37:42  [id number] Page 17 of 18 Negative for: Cirrhosis ; Colitis; Crohns; Hepatitis A; Hepatitis B; Hepatitis C Endocrine Medical History: Negative for: Type I Diabetes; Type II Diabetes Genitourinary Medical History: Negative for: End Stage Renal Disease Past Medical History Notes: kidney stone Immunological Medical History: Negative for: Lupus Erythematosus; Raynauds; Scleroderma Integumentary (Skin) Medical History: Negative for: History of Burn Musculoskeletal Medical History: Negative for: Gout; Rheumatoid Arthritis; Osteoarthritis; Osteomyelitis Neurologic Medical History: Negative for: Dementia; Neuropathy; Quadriplegia; Paraplegia; Seizure Disorder Oncologic Medical History: Negative for: Received Chemotherapy; Received Radiation Psychiatric Medical History: Negative for: Anorexia/bulimia; Confinement Anxiety Past Medical History Notes: anxiety Immunizations Pneumococcal Vaccine: Received Pneumococcal Vaccination: No Implantable Devices None Hospitalization / Surgery History Type of Hospitalization/Surgery COVID PNA 07/22/2019- 11/14/2019 03/27/2020 wound debridement/ skin graft Family and Social History Cancer: Yes - Maternal Grandparents; Diabetes: Yes - Father,Paternal Grandparents; Heart Disease: Yes - Maternal  Grandparents; Hereditary Spherocytosis: No; Hypertension: Yes - Father,Paternal Grandparents; Kidney Disease: No; Lung Disease: Yes - Siblings; Seizures: No; Stroke: No; Thyroid Problems: No; Tuberculosis: No; Never smoker; Marital Status - Married; Alcohol Use: Never; Drug Use: No History; Caffeine Use: Daily - tea, soda; Financial Concerns: No; Food, Clothing or Shelter Needs: No; Support System Lacking: No; Transportation Concerns: No Electronic Signature(s) Signed: 04/27/2023 11:42:07 AM By: [name] MD FACS Entered By: [name] on 04/27/2023 08:31:45 [name] ([id number]) [id number] Page 18 of 18 -------------------------------------------------------------------------------- SuperBill Details Patient Name: Date of Service: [name], TO [name] 04/27/2023 Medical Record Number: [id number] Patient Account Number: [id number] Date of Birth/Sex: Treating RN: 1976-01-25 (47 y.o. 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 Facility Procedures : CPT4 Code: [id number] Description: 97597 - DEBRIDE WOUND 1ST 20 SQ CM OR < ICD-10 Diagnosis Description L97.522 Non-pressure chronic ulcer of other part of left foot with fat layer exposed Modifier: Quantity: 1 Physician Procedures : CPT4 Code Description Modifier [id number] - WC PHYS LEVEL 4 - EST PT 25 ICD-10 Diagnosis Description L97.512 Non-pressure chronic ulcer of other part of right foot with fat layer exposed L97.522 Non-pressure chronic ulcer of other part of left foot  with fat layer exposed Quantity: 1 : [id number] 97597 - WC PHYS DEBR WO ANESTH 20 SQ CM ICD-10  Diagnosis Description L97.522 Non-pressure chronic ulcer of other part of left foot with fat layer exposed Quantity: 1 Electronic Signature(s) Signed: 04/27/2023 11:37:43 AM By: [name] MD FACS Entered By: [name] on 04/27/2023 08:37:42  Sponge, Non-Sterile 4x4 in (Generic) 1 x Per Day/Other:until healed Discharge Instructions: Apply over primary dressing as directed. Secured With: 36M Medipore H Soft Cloth Surgical T ape, 4 x 10 (in/yd) (Generic) 1 x Per Day/Other:until healed Discharge Instructions: Secure with tape as directed. Compression Wrap: Kerlix Roll 4.5x3.1 (in/yd) (Generic) 1 x Per Day/Other:until healed Discharge Instructions: Apply Kerlix and Coban compression as directed. Add-Ons: [employer] Offloading Boot, Size Regular 1 x Per Day/Other:until healed Wound #5 - Foot Wound Laterality: Plantar, Right Topical: Gentamicin 1 x Per Day/30 Days Discharge Instructions: As directed by physician Prim Dressing: Maxorb Extra Ag+ Alginate Dressing, 4x4.75 (in/in) 1 x Per Day/30 Days ary Discharge Instructions: Apply to wound bed as instructed Secondary Dressing: Drawtex 4x4 in 1 x Per Day/30 Days Discharge Instructions: Apply over primary dressing as directed. Secondary Dressing: Optifoam  Non-Adhesive Dressing, 4x4 in 1 x Per Day/30 Days Discharge Instructions: Apply over primary dressing as directed. Secured With: [employer], 4.5x3.1 (in/yd) 1 x Per Day/30 Days Discharge Instructions: Secure with Kerlix as directed. Patient Medications llergies: No Known Drug Allergies A Notifications Medication Indication Start End 04/27/2023 gentamicin DOSE topical 0.1 % ointment - apply thin layer to wounds with dressing changes Electronic Signature(s) Signed: 04/27/2023 12:33:23 PM By: [name] MD FACS Signed: 04/27/2023 4:20:28 PM By: Brenton Grills Previous Signature: 04/27/2023 11:42:07 AM Version By: [name] MD FACS Previous Signature: 04/27/2023 11:34:47 AM Version By: [name] MD FACS Entered By: Brenton Grills on 04/27/2023 08:44:46 -------------------------------------------------------------------------------- Problem List Details Patient Name: Date of Service: [name], [name] 04/27/2023 11:00 A M Medical Record Number: [id number] Patient Account Number: [id number] Date of Birth/Sex: Treating RN: [birth year] (47 y.o. [name] Primary Care Provider: Dorinda Hill Other Clinician: Referring Provider: Treating Provider/Extender: Jana Hakim in Treatment: [phone] Active Problems ICD-10 Encounter Code Description Active Date MDM Diagnosis [name], [name] ([id number]) [phone] Page 9 of 18 L97.522 Non-pressure chronic ulcer of other part of left foot with fat layer exposed 09/03/2020 No Yes L97.512 Non-pressure chronic ulcer of other part of right foot with fat layer exposed 04/27/2023 No Yes Inactive Problems ICD-10 Code Description Active Date Inactive Date L89.893 Pressure ulcer of other site, stage 3 07/15/2020 07/15/2020 M62.81 Muscle weakness (generalized) 07/15/2020 07/15/2020 I10 Essential (primary) hypertension 07/15/2020 07/15/2020 M86.171 Other acute osteomyelitis, right ankle and  foot 09/03/2020 09/03/2020 Resolved Problems Electronic Signature(s) Signed: 04/27/2023 11:28:10 AM By: [name] MD FACS Entered By: [name] on 04/27/2023 08:28:10 -------------------------------------------------------------------------------- Progress Note Details Patient Name: Date of Service: [name], [name] 04/27/2023 11:00 A M Medical Record Number: [id number] Patient Account Number: [id number] Date of Birth/Sex: Treating RN: 23-Sep-1975 (47 y.o. M) Primary Care Provider: Dorinda Hill Other Clinician: Referring Provider: Treating Provider/Extender: Jana Hakim in Treatment: 145 Subjective Chief Complaint Information obtained from Patient Bilateral Plantar Foot Ulcers History of Present Illness (HPI) Wounds are12/03/2020 upon evaluation today patient presents for initial inspection here in our clinic concerning issues he has been having with the bottoms of his feet bilaterally. He states these actually occurred as wounds when he was hospitalized for 5 months secondary to Covid. He was apparently with tilting bed where he was in an upright position quite frequently and apparently this occurred in some way shape or form during that time. Fortunately there is no sign of active infection at this time. No fevers, chills, nausea, vomiting, or diarrhea. With that being said he still has substantial wounds on the plantar aspects

## 2023-05-01 ENCOUNTER — Encounter (HOSPITAL_BASED_OUTPATIENT_CLINIC_OR_DEPARTMENT_OTHER): Payer: Self-pay | Admitting: Physical Therapy

## 2023-05-01 ENCOUNTER — Ambulatory Visit (HOSPITAL_BASED_OUTPATIENT_CLINIC_OR_DEPARTMENT_OTHER): Payer: PPO | Admitting: Physical Therapy

## 2023-05-01 DIAGNOSIS — M6281 Muscle weakness (generalized): Secondary | ICD-10-CM | POA: Diagnosis not present

## 2023-05-01 DIAGNOSIS — M25661 Stiffness of right knee, not elsewhere classified: Secondary | ICD-10-CM

## 2023-05-01 DIAGNOSIS — R262 Difficulty in walking, not elsewhere classified: Secondary | ICD-10-CM

## 2023-05-01 DIAGNOSIS — M25662 Stiffness of left knee, not elsewhere classified: Secondary | ICD-10-CM

## 2023-05-01 NOTE — Therapy (Incomplete)
OUTPATIENT PHYSICAL THERAPY TREATMENT NOTE   Patient Name: [name] MRN: [id number] DOB:28-Jun-1976, 47 y.o., male Today's Date: 05/02/2023  END OF SESSION:  PT End of Session -  05/01/2023    Visit Number 3   Number of Visits 4   Date for PT Re-Evaluation 05/29/2023   Authorization Type HTA   PT Start Time  1320   PT Stop Time 1355   PT Time Calculation (min) 35 min   Activity Tolerance Patient tolerated treatment well   Behavior During Therapy  WFL for tasks assessed/performed      Past Medical History:  Diagnosis Date   Anginal pain (HCC)    with covid   Anxiety    Asthma    Depression    Dyspnea    GERD (gastroesophageal reflux disease)    Headache    History of kidney stones    LEFT URETERAL STONE   HTN (hypertension)    Pancreatitis 2018   GALLBALDDER SLUDGE CAUSED ISSUED RESOLVED   Pneumonia 07/2019   covid   Prediabetes    SOBOE (shortness of breath on exertion)    Past Surgical History:  Procedure Laterality Date   CANNULATION FOR ECMO (EXTRACORPOREAL MEMBRANE OXYGENATION) N/A 08/28/2019   Procedure: CANNULATION FOR VV ECMO (EXTRACORPOREAL MEMBRANE OXYGENATION);  Surgeon: Donata Clay, Theron Arista, MD;  Location: [facility] OR;  Service: Open Heart Surgery;  Laterality: N/A;  CRESCENT CANNULA   CANNULATION FOR ECMO (EXTRACORPOREAL MEMBRANE OXYGENATION) N/A 09/10/2019   Procedure: CANNULATION FOR ECMO (EXTRACORPOREAL MEMBRANE OXYGENATION) PUTTING IN CRESCENT 32FR CANNULA  AND REMOVING GROING CANNULATION;  Surgeon: Linden Dolin, MD;  Location: [facility] OR;  Service: Open Heart Surgery;  Laterality: N/A;  PUTTING IN CRESCENT/REMOVING GROIN CANNULATION   CYSTOSCOPY/URETEROSCOPY/HOLMIUM LASER/STENT PLACEMENT Left 04/18/2019   Procedure: LEFT URETEROSCOPY/HOLMIUM LASER/STENT PLACEMENT;  Surgeon: Crist Fat, MD;  Location: [facility];  Service: Urology;  Laterality: Left;   CYSTOSCOPY/URETEROSCOPY/HOLMIUM LASER/STENT PLACEMENT Left 05/02/2019   Procedure:  CYSTOSCOPY/URETEROSCOPY/HOLMIUM LASER/STENT EXCHANGE;  Surgeon: Crist Fat, MD;  Location: [facility] ORS;  Service: Urology;  Laterality: Left;   ECMO CANNULATION N/A 08/03/2019   Procedure: ECMO CANNULATION;  Surgeon: Linden Dolin, MD;  Location: [facility];  Service: Cardiovascular;  Laterality: N/A;   ESOPHAGOGASTRODUODENOSCOPY N/A 09/11/2019   Procedure: ESOPHAGOGASTRODUODENOSCOPY (EGD);  Surgeon: Linden Dolin, MD;  Location: [facility] OR;  Service: Thoracic;  Laterality: N/A;   GRAFT APPLICATION Bilateral 03/27/2020   Procedure: APPLICATION OF SKIN GRAFT BILATERAL FEET;  Surgeon: Felecia Shelling, DPM;  Location: [facility];  Service: Podiatry;  Laterality: Bilateral;   IR REPLC GASTRO/COLONIC TUBE PERCUT W/FLUORO  10/14/2019   IRRIGATION AND DEBRIDEMENT SHOULDER Right 09/29/2017   Procedure: IRRIGATION AND DEBRIDEMENT SHOULDER;  Surgeon: Tarry Kos, MD;  Location: [facility] OR;  Service: Orthopedics;  Laterality: Right;   LUMBAR DISC SURGERY  2002   NASAL ENDOSCOPY WITH EPISTAXIS CONTROL N/A 08/31/2019   Procedure: NASAL ENDOSCOPY WITH EPISTAXIS CONTROL WITH CAUTERIZATION;  Surgeon: Christia Reading, MD;  Location: [facility] OR;  Service: ENT;  Laterality: N/A;   PORTACATH PLACEMENT N/A 09/11/2019   Procedure: PEG TUBE INSERTION - BEDSIDE;  Surgeon: Linden Dolin, MD;  Location: [facility] OR;  Service: Thoracic;  Laterality: N/A;   TEE WITHOUT CARDIOVERSION N/A 08/28/2019   Procedure: TRANSESOPHAGEAL ECHOCARDIOGRAM (TEE);  Surgeon: Donata Clay, Theron Arista, MD;  Location: [facility] OR;  Service: Open Heart Surgery;  Laterality: N/A;   TEE WITHOUT CARDIOVERSION N/A 09/10/2019   Procedure: TRANSESOPHAGEAL ECHOCARDIOGRAM (TEE);  Surgeon: Linden Dolin, MD;  Location: [facility] OR;  Service: Open Heart Surgery;  Laterality: N/A;   WOUND DEBRIDEMENT Bilateral 03/27/2020   Procedure: EXCISIONAL DEBRIDEMENT OF ULCERS BILATERAL FEET;  Surgeon: Felecia Shelling, DPM;  Location: [facility];  Service: Podiatry;  Laterality: Bilateral;   Patient  Active Problem List   Diagnosis Date Noted   Type 2 diabetes mellitus without complication, without long-term current use of insulin (HCC) 10/18/2022   Vitamin D deficiency 10/18/2022   Other fatigue 09/07/2022   SOBOE (shortness of breath on exertion) 09/07/2022   Nocturnal hypoxemia 09/07/2022   Mood disorder (HCC) 09/07/2022   Ulcer of left foot (HCC) 09/07/2022   Depression 08/23/2022   Prediabetes 08/23/2022   Obstructive sleep apnea 01/11/2022   Insomnia due to other mental disorder 12/06/2021   Left foot drop 09/06/2021   Wheelchair dependence 06/04/2021   Encounter for therapeutic drug monitoring 09/29/2020   COVID-19 long hauler manifesting chronic dyspnea 09/15/2020   Morbid obesity (HCC) with starting BMI 46 09/15/2020   Encounter for attention to tracheostomy (HCC) 09/15/2020   Critical illness polyneuropathy (HCC) 09/15/2020   Moderate protein-calorie malnutrition (HCC) 09/15/2020   Pyogenic inflammation of bone (HCC) 09/09/2020   Long COVID 09/09/2020   Acute osteomyelitis (HCC) 08/31/2020   MSSA (methicillin susceptible Staphylococcus aureus) infection 08/31/2020   Reactive depression 07/01/2020   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   Impaired sensation to light touch 04/15/2020   Diffuse alveolar damage (HCC) 03/09/2020   Eschar of foot    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   History of COVID-19    Pressure injury of skin 08/22/2019   Need for emotional support    Chest tube in place    Personal history of ECMO    Advanced care planning/counseling discussion    Palliative care by specialist    Goals of care, counseling/discussion    Advanced directives, counseling/discussion    Subcutaneous crepitus    Thrombocytopenia (HCC)    Leukopenia    Fever    Gram positive bacterial infection    Septic arthritis of right acromioclavicular joint (HCC) 09/29/2017   Chronic left-sided low back pain with left-sided sciatica  09/19/2017   Epigastric pain    Primary hypertension 04/17/2017   Moderate persistent asthma 07/21/2015   Allergic rhinoconjunctivitis 07/21/2015   GERD (gastroesophageal reflux disease) 07/21/2015   Abnormal gait 11/12/2009   TARSAL TUNNEL SYNDROME, LEFT 10/08/2009   PES PLANUS 10/08/2009     REFERRING PROVIDER: Berna Bue, MD   REFERRING DIAG: E66.01 (ICD-10-CM) - Morbid obesity due to excess calories   Rationale for Evaluation and Treatment: Rehabilitation  THERAPY DIAG:  Muscle weakness (generalized)  Stiffness of right knee, not elsewhere classified  Stiffness of left knee, not elsewhere classified  Difficulty in walking, not elsewhere classified  ONSET DATE: PT order 02/03/2023  SUBJECTIVE:  SUBJECTIVE STATEMENT: Pt is planning to have bariatric surgery.  MD referred pt to PT to work on some conditioning exercises.  PT order 02/03/2023.  Reason for referral:  deconditioning  Pt states his L foot is doing better though does have some drying and cracking in his R foot.  Pt denies any adverse effects after prior Rx.  Pt states he had a vasectomy on the Friday before last.  He reports instructions of no strenuous activity for 1 week.  Pt states he hasn't done his home exercises due to recent vasectomy.   PERTINENT HISTORY:  L foot ulcer--Pt primarily in W/C and not putting much weight through L LE.  Pt was instructed to stay off his foot as much as possible.  He is in a offloader shoe. Debridement of ulcers in bilat feet and skin graft in 2021.  R foot ulcer with good healing.  Pt receives wound care for L foot ulcer.  Pt states he has some dropfoot on L.   Long covid, chronic back pain, arthritis, depression, HTN, Dyspnea,  Lumbar surgery for bulging disc in 2002 R shoulder  arthrotomy of AC jt, irrigation and debridement of distal clavicle, and partial excision of distal clavicle on 2019   PAIN:  Location:  back NPRS:  4/10 current, 8/10 worst, 2/10 best  PRECAUTIONS: L foot ulcer--avoid WB'ing of L LE, long covid, lumbar surgery in 2002    WEIGHT BEARING RESTRICTIONS: Pt states he doesn't have specific WB'ing restrictions though was instructed to stay off his foot as much as possible due to L foot ulcer.    FALLS:  Has patient fallen in last 6 months? Yes. Number of falls 2  LIVING ENVIRONMENT: Lives with: lives with their family Lives in: 1 story home Stairs: 5-6 steps with rails; has a ramp Has following equipment at home: W/C, FWW  OCCUPATION: Pt is on disability.  PLOF: Independent  PATIENT GOALS: improve muscle strength and stamina, to be able to walk again.    OBJECTIVE:   DIAGNOSTIC FINDINGS:  Chest x ray on 03/02/2023: FINDINGS: Stable cardiac and mediastinal contours. Similar scarring within the left mid and lower lung and right upper lung. No pleural effusion or pneumothorax. Thoracic spine degenerative changes.   IMPRESSION: No acute cardiopulmonary process. Similar scarring.   TODAY'S TREATMENT:                                                                                                                                   Pt performed:   Seated march 2x10 reps   LAQ 2x10 reps   Seated rows with RTB 2x10   Seated shoulder ER with RTB 2x10   Seated shoulder horizontal abd with RTB 2x10   Seated knee flexion AROM x10 bilat with table elevated (open chain)        Reviewed HEP.  Updated HEP.  Pt received a HEP handout and was educated in correct form  and appropriate frequency.       PATIENT EDUCATION:  Education details:  POC, HEP, exercise form, relevant anatomy, and rationale of exercises.  PT answered pt and wife's questions.  Person educated: Patient and wife Education method: Explanation, demonstration, verbal and  tactile cues, handout Education comprehension: verbalized understanding, returned demonstration, verbal and tactile cues required  HOME EXERCISE PROGRAM: Access Code: [email or web address] URL: [email or web address] Date: 04/17/2023 Prepared by: Aaron Edelman  Exercises - Seated March  - 1-2 x daily - 7 x weekly - 1-2 sets - 10 reps - Seated Long Arc Quad  - 1 x daily - 7 x weekly - 2 sets - 10 reps  Updated HEP: - Seated Shoulder Row with Anchored Resistance  - 1 x daily - 4-5 x weekly - 2 sets - 10 reps - Seated Bilateral Shoulder External Rotation with Resistance  - 1 x daily - 4 x weekly - 2 sets - 10 reps - Seated Shoulder Horizontal Abduction with Resistance - Palms Down  - 1 x daily - 4 x weekly - 2 sets - 10 reps   ASSESSMENT:  CLINICAL IMPRESSION: PT reviewed HEP and pt performed HEP.  PT performed exercises to improve LE strength, knee ROM, and UE/postural strength.  Pt performed exercises well with cuing and instruction in correct form.  He tolerated exercises well.  PT is limited with ther ex due to ulcers in bilat feet and is limited with Wb'ing.  PT updated HEP and gave pt a HEP handout.  Pt demonstrates good understanding of HEP.  He responded well to Rx having no c/o's after Rx.      OBJECTIVE IMPAIRMENTS: decreased activity tolerance, decreased endurance, decreased mobility, difficulty walking, decreased ROM, decreased strength, hypomobility, impaired flexibility, and pain.   ACTIVITY LIMITATIONS: carrying, standing, squatting, stairs, transfers, and locomotion level  PARTICIPATION LIMITATIONS: meal prep, cleaning, laundry, shopping, community activity, and occupation  PERSONAL FACTORS: Fitness and 3+ comorbidities: L foot ulcer, Wb'ing, long covid, lumbar surgery, chronic back pain, arthritis, depression  are also affecting patient's functional outcome.   REHAB POTENTIAL: good for goals  CLINICAL DECISION MAKING: Evolving/moderate complexity  EVALUATION  COMPLEXITY: Low   GOALS:   SHORT TERM GOALS: Target date: 05/01/2023   Pt will perform HEP without adverse effects for improved ROM and strength.  Baseline: Goal status: INITIAL    LONG TERM GOALS: Target date: 05/29/2023  Pt will demo 0 deg of R knee extension AROM and at least 115 deg of bilat knee flexion AROM for improved stiffness and mobility and for improved gait when he is able to ambulate more.   Baseline:  Goal status: INITIAL  2.  Pt will be independent and compliant with HEP for improved pain, ROM, strength, and function.  Baseline:  Goal status: INITIAL  3.  Pt will report at least a 50% improvement in stamina/tolerance with daily activities.  Baseline:  Goal status: INITIAL    PLAN:  PT FREQUENCY:  once every other week  PT DURATION: 8 weeks  PLANNED INTERVENTIONS: Therapeutic exercises, Therapeutic activity, Neuromuscular re-education, Balance training, Gait training, Patient/Family education, Self Care, Joint mobilization, Stair training, DME instructions, Electrical stimulation, Spinal mobilization, Cryotherapy, Moist heat, Taping, Ultrasound, Manual therapy, and Re-evaluation.  PLAN FOR NEXT SESSION: Avoid WB'ing of L LE until more healing has taken place and wound care.  Cont with ther ex.  Review and perform HEP.  Knee ROM.     Audie Clear III PT, DPT 05/02/23 9:35 PM

## 2023-05-02 ENCOUNTER — Other Ambulatory Visit: Payer: Self-pay

## 2023-05-02 ENCOUNTER — Encounter: Payer: Self-pay | Admitting: Allergy & Immunology

## 2023-05-02 ENCOUNTER — Encounter (HOSPITAL_BASED_OUTPATIENT_CLINIC_OR_DEPARTMENT_OTHER): Payer: Self-pay | Admitting: Physical Therapy

## 2023-05-02 ENCOUNTER — Encounter: Payer: Self-pay | Admitting: Dietician

## 2023-05-02 ENCOUNTER — Encounter: Payer: PPO | Attending: Surgery | Admitting: Dietician

## 2023-05-02 ENCOUNTER — Ambulatory Visit (INDEPENDENT_AMBULATORY_CARE_PROVIDER_SITE_OTHER): Payer: PPO | Admitting: Allergy & Immunology

## 2023-05-02 VITALS — BP 150/102 | HR 75 | Temp 98.6°F

## 2023-05-02 VITALS — Ht 68.5 in | Wt 304.3 lb

## 2023-05-02 DIAGNOSIS — E119 Type 2 diabetes mellitus without complications: Secondary | ICD-10-CM | POA: Diagnosis not present

## 2023-05-02 DIAGNOSIS — J4540 Moderate persistent asthma, uncomplicated: Secondary | ICD-10-CM | POA: Diagnosis not present

## 2023-05-02 DIAGNOSIS — E669 Obesity, unspecified: Secondary | ICD-10-CM | POA: Diagnosis not present

## 2023-05-02 DIAGNOSIS — L209 Atopic dermatitis, unspecified: Secondary | ICD-10-CM | POA: Diagnosis not present

## 2023-05-02 DIAGNOSIS — J302 Other seasonal allergic rhinitis: Secondary | ICD-10-CM | POA: Diagnosis not present

## 2023-05-02 DIAGNOSIS — J3089 Other allergic rhinitis: Secondary | ICD-10-CM

## 2023-05-02 NOTE — Patient Instructions (Addendum)
E 1. Moderate persistent asthma, uncomplicated - Lung testing was [facility] better compared to the last time. - Call your insurance company to see what the out of pocket would be for Tezspire injections:    - Then call us back with that information.  - Daily controller medication(s):  Breztri two puffs twice daily with spacer - Prior to physical activity: albuterol 2 puffs 10-15 minutes before physical activity. - Rescue medications: albuterol 4 puffs every 4-6 hours as needed - Asthma control goals:  * Full participation in all desired activities (may need albuterol before activity) * Albuterol use two time or less a week on average (not counting use with activity) * Cough interfering with sleep two time or less a month * Oral steroids no more than once a year * No hospitalizations  2. Allergic rhinitis - well controlled (grass, weeds, ragweed, trees, dust mites, cat, and dog) - Continue with Nasacort one spray per nostril daily. - Continue with Atrovent nasal spray one spray per nostril every 8 hours as needed for runny nose.  - Continue with cetirizine 10mg  daily.  3. Dermatitis on neck/scalp - Continue with fluocinolone oil twice daily as needed. - You can use this over the rest of the body as well.   4. Return in about 6 months (around 10/30/2023).    Please inform us of any Emergency Department visits, hospitalizations, or changes in symptoms. Call us before going to the ED for breathing or allergy symptoms since we might be able to fit you in for a sick visit. Feel free to contact us anytime with any questions, problems, or concerns.  It was a pleasure to see you and your family again today!   Websites that have reliable patient information: 1. American Academy of Asthma, Allergy, and Immunology: www.aaaai.org 2. Food Allergy Research and Education (FARE): foodallergy.org 3. Mothers of Asthmatics: http://www.asthmacommunitynetwork.org 4. American College of Allergy, Asthma, and  Immunology: www.acaai.org   COVID-19 Vaccine Information can be found at: PodExchange.[email or web address] For questions related to vaccine distribution or appointments, please email [email or web address] or call [phone].   We realize that you might be concerned about having an allergic reaction to the COVID19 vaccines. To help with that concern, WE ARE OFFERING THE COVID19 VACCINES IN OUR OFFICE! Ask the front desk for dates!     "Like" [place] on Facebook and Instagram for our latest updates!      A healthy democracy works best when [employer] participate! Make sure you are registered to vote! If you have moved or changed any of your contact information, you will need to get this updated before voting!  In some cases, you MAY be able to register to vote online: [email or web address]

## 2023-05-02 NOTE — Progress Notes (Unsigned)
FOLLOW UP  Date of Service/Encounter:  05/02/23   Assessment:   Moderate persistent asthma, uncomplicated   Absolute eosinophil count of 200 (August 2023)   Perennial and seasonal allergic rhinitis (grass, weeds, ragweed, trees, dust mites, cat, and dog)   Prolonged hospitalization for COVID19 requiring ECMO in late 2020   Fully vaccinated against COVID19  Plan/Recommendations:   1. Moderate persistent asthma, uncomplicated - Lung testing was [facility] better compared to the last time. - Call your insurance company to see what the out of pocket would be for Tezspire injections:  - Then call us back with that information.  - Daily controller medication(s):  Breztri two puffs twice daily with spacer - Prior to physical activity: albuterol 2 puffs 10-15 minutes before physical activity. - Rescue medications: albuterol 4 puffs every 4-6 hours as needed - Asthma control goals:  * Full participation in all desired activities (may need albuterol before activity) * Albuterol use two time or less a week on average (not counting use with activity) * Cough interfering with sleep two time or less a month * Oral steroids no more than once a year * No hospitalizations  2. Allergic rhinitis - well controlled (grass, weeds, ragweed, trees, dust mites, cat, and dog) - Continue with Nasacort one spray per nostril daily. - Continue with Atrovent nasal spray one spray per nostril every 8 hours as needed for runny nose.  - Continue with cetirizine 10mg  daily.  3. Dermatitis on neck/scalp - Continue with fluocinolone oil twice daily as needed. - You can use this over the rest of the body as well.   4. Return in about 6 months (around 10/30/2023).    Subjective:   [name] is a 47 y.o. male presenting today for follow up of  Chief Complaint  Patient presents with   Follow-up    No concerns    [name] has a history of the following: Patient Active Problem List   Diagnosis  Date Noted   Type 2 diabetes mellitus without complication, without long-term current use of insulin (HCC) 10/18/2022   Vitamin D deficiency 10/18/2022   Other fatigue 09/07/2022   SOBOE (shortness of breath on exertion) 09/07/2022   Nocturnal hypoxemia 09/07/2022   Mood disorder (HCC) 09/07/2022   Ulcer of left foot (HCC) 09/07/2022   Depression 08/23/2022   Prediabetes 08/23/2022   Obstructive sleep apnea 01/11/2022   Insomnia due to other mental disorder 12/06/2021   Left foot drop 09/06/2021   Wheelchair dependence 06/04/2021   Encounter for therapeutic drug monitoring 09/29/2020   COVID-19 long hauler manifesting chronic dyspnea 09/15/2020   Morbid obesity (HCC) with starting BMI 46 09/15/2020   Encounter for attention to tracheostomy (HCC) 09/15/2020   Critical illness polyneuropathy (HCC) 09/15/2020   Moderate protein-calorie malnutrition (HCC) 09/15/2020   Pyogenic inflammation of bone (HCC) 09/09/2020   Long COVID 09/09/2020   Acute osteomyelitis (HCC) 08/31/2020   MSSA (methicillin susceptible Staphylococcus aureus) infection 08/31/2020   Reactive depression 07/01/2020   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   Impaired sensation to light touch 04/15/2020   Diffuse alveolar damage (HCC) 03/09/2020   Eschar of foot    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   History of COVID-19    Pressure injury of skin 08/22/2019   Need for emotional support    Chest tube in place    Personal history of ECMO    Advanced care planning/counseling discussion    Palliative care by specialist  Goals of care, counseling/discussion    Advanced directives, counseling/discussion    Subcutaneous crepitus    Thrombocytopenia (HCC)    Leukopenia    Fever    Gram positive bacterial infection    Septic arthritis of right acromioclavicular joint (HCC) 09/29/2017   Chronic left-sided low back pain with left-sided sciatica 09/19/2017   Epigastric pain     Primary hypertension 04/17/2017   Moderate persistent asthma 07/21/2015   Allergic rhinoconjunctivitis 07/21/2015   GERD (gastroesophageal reflux disease) 07/21/2015   Abnormal gait 11/12/2009   TARSAL TUNNEL SYNDROME, LEFT 10/08/2009   PES PLANUS 10/08/2009    History obtained from: chart review and patient.  [name] is a 47 y.o. male presenting for a follow up visit.  He was last seen in June 2024.  At that time, his lung testing was stable.  We gave him a sample of Tezspire and continue with Breztri 2 puffs twice daily with spacer.  For his rhinitis, we continue with the Nasacort as well as Atrovent and cetirizine.  He had dermatitis on his neck and scalp, so we added fluocinolone oil twice daily.  Since last visit, he has done well.   Asthma/Respiratory Symptom History: He did think that the sample from June helped a lot. He remains on the Breztri two puffs BID. He is using the emergency inhaler once in the evenings priro to bed. It feels that he it opens him up before he puts his oxygen on. He has been on 2 L O2 at night.  His PCP must be following his oxygen. He does feel that the use of the TEzspire helped. I did discuss with Tammy and his PAP was denied. She recommended that he talk to his insurance company to get an estimated out of pocket cost and then resubmit the PAP form.   Allergic Rhinitis Symptom History: He reports that his sneezing and itchy eyes are fairly well controlled. They do have two dogs inside. He does think that they might bother him, but it is worth it for the companionship. They are thinking of getting a third dog.   Skin Symptom History: Skin is under much better control with the fluocinolone oil. This seems to be working to control his inflammation of his skin. He does not need refills of this.   His pressure ulcer on his left foot is healing.  He has been eating a high-protein diet to help this happen.  His wound doctor is optimistic that he will NOT be in the boot  in the next 6 months.  Otherwise, there have been no changes to his past medical history, surgical history, family history, or social history.    Review of systems otherwise negative other than that mentioned in the HPI.    Objective:   Blood pressure (!) 150/102, pulse 75, temperature 98.6 F (37 C), SpO2 95%. There is no height or weight on file to calculate BMI.    Physical Exam Vitals reviewed.  Constitutional:      Appearance: He is well-developed.     Comments: Not in a wheelchair today.  Comfortable appearing.  HENT:     Head: Normocephalic and atraumatic.     Right Ear: Tympanic membrane, ear canal and external ear normal.     Left Ear: Tympanic membrane, ear canal and external ear normal.     Nose: No nasal deformity, septal deviation, nasal tenderness, mucosal edema, congestion or rhinorrhea.     Right Turbinates: Enlarged, swollen and pale.  Left Turbinates: Enlarged, swollen and pale.     Right Sinus: No maxillary sinus tenderness or frontal sinus tenderness.     Left Sinus: No maxillary sinus tenderness or frontal sinus tenderness.     Comments: No nasal polyps.    Mouth/Throat:     Lips: Pink.     Mouth: Mucous membranes are moist. Mucous membranes are not pale and not dry.     Pharynx: Uvula midline.     Comments: Cobblestoning present in the posterior oropharynx.  Eyes:     General: Lids are normal. Allergic shiner present.        Right eye: No discharge.        Left eye: No discharge.     Conjunctiva/sclera: Conjunctivae normal.     Right eye: Right conjunctiva is not injected. No chemosis.    Left eye: Left conjunctiva is not injected. No chemosis.    Pupils: Pupils are equal, round, and reactive to light.  Cardiovascular:     Rate and Rhythm: Normal rate and regular rhythm.     Heart sounds: Normal heart sounds.  Pulmonary:     Effort: Pulmonary effort is normal. No tachypnea, accessory muscle usage or respiratory distress.     Breath sounds:  Normal breath sounds. No wheezing, rhonchi or rales.     Comments: Moving air well in the upper fields, but not in the lower fields. Chest:     Chest wall: No tenderness.  Lymphadenopathy:     Cervical: No cervical adenopathy.  Skin:    General: Skin is warm.     Capillary Refill: Capillary refill takes less than 2 seconds.     Coloration: Skin is not pale.     Findings: Wound present. No abrasion, erythema, petechiae or rash. Rash is not papular, urticarial or vesicular.     Comments: He has some redness over the back of the neck as extending into the  scalp.   Neurological:     Mental Status: He is alert.  Psychiatric:        Behavior: Behavior is cooperative.      Diagnostic studies:    Spirometry: results normal (FEV1: 2.61/71%, FVC: 3.25/70%, FEV1/FVC: 80%).    Spirometry consistent with normal pattern.   Allergy Studies: none        Malachi Bonds, MD  [facility]

## 2023-05-02 NOTE — Progress Notes (Signed)
Supervised Weight Loss Visit Bariatric Nutrition Education Appt Start Time: 11:59    End Time: 12:27  Planned surgery: undetermined (pt states the surgeon is suggesting the sleeve, he states he is leaning toward the RYGB)  Pt expectation of surgery: more confidence and being healthier; do more with family  Referral stated Supervised Weight Loss (SWL) visits needed: 5  1 out of 5 SWL Appointments   NUTRITION ASSESSMENT   Anthropometrics  Start weight at [facility]: 303.9 lbs (date: 04/14/2023) Height: 68.5 in Weight today: 304.3 lbs BMI: 45.60 kg/m2     Clinical   Pharmacotherapy: History of weight loss medication used: Ozempic and Mounjaro, stating insurance started not covering.  Medical hx: asthma, obesity, sleep apnea, HTN Medications: vit D, nasacort, desyrel, miagra, protonix, singulair,  lamictal, atrovent, norco, synalar, cymbalta, symbicort, norvasc, aluterol,  Labs: vit D 21.1; HDL 33; A1c 6.1; sodium 145; albumin 4.0; RBC 6.06; platelets 132 Notable signs/symptoms: wounds from pressure area in the hospital years ago; arrived in wheel chair Any previous deficiencies? No  Lifestyle & Dietary Hx  Pt states eating throughout the day is not going that great, stating it is hard to do right now. Pt states he went out of town. Pt states he has been eating more non-starchy vegetables. Pt states he may wake up a little later and then skip breakfast.  Estimated daily fluid intake: 64 oz Supplements: vit D Current average weekly physical activity: chair exercises; physical therapy once a week  24-Hr Dietary Recall First Meal: skip or 4 egg whites, 1-2 slices of sara lee delights bread, sf jam, [place] sausage, Fairlife fat free milk Snack: fruit or protein shake or triscuit or wheat thin or cheese its Second Meal: salad with lean meat (deli), lean cuisine steamer bowl or spring mix salad with sliced meat and cheese. Snack: fruit or protein shake or triscuit or wheat thin or cheese  its Third Meal: varies, lean meat, vegetable, rice Snack: sometimes baked cheese its Beverages: water, one regular soda a day or every other day  Estimated Energy Needs Calories: 1600  NUTRITION DIAGNOSIS  Overweight/obesity ([place]-3.3) related to past poor dietary habits and physical inactivity as evidenced by patient w/ planned sleeve or RYGB surgery following dietary guidelines for continued weight loss.   NUTRITION INTERVENTION  Nutrition counseling (C-1) and education (E-2) to facilitate bariatric surgery goals.  Encouraged patient to honor their body's internal hunger and fullness cues.  Throughout the day, check in mentally and rate hunger. Stop eating when satisfied not full regardless of how much food is left on the plate.  Get more if still hungry 20-30 minutes later.  The key is to honor satisfaction so throughout the meal, rate fullness factor and stop when comfortably satisfied not physically full. The key is to honor hunger and fullness without any feelings of guilt or shame.  Pay attention to what the internal cues are, rather than any external factors. This will enhance the confidence you have in listening to your own body and following those internal cues enabling you to increase how often you eat when you are hungry not out of appetite and stop when you are satisfied not full.  Encouraged pt to continue to eat balanced meals inclusive of non starchy vegetables 2 times a day 7 days a week Encouraged pt to choose lean protein sources: limiting beef, pork, sausage, hotdogs, and lunch meat Encourage pt to choose healthy fats such as plant based limiting animal fats Encouraged pt to continue to drink a  minium 64 fluid ounces with half being plain water to satisfy proper hydration    Pre-Op Goals Progress & New Goals Continue: include a protein food with every meal or snacks Continue: avoid skipping meals; eat every 3-5 hours; keep sleep routine and scheduled meals Continue: Eat non  starchy vegetables 2 times a day 7 days a week New: practice separating fluids from your meals and snacks; stop drinking 15 minutes before you eat and don't drink while you eat and for 30 minutes after. New: increase physical activity; use physical therapy exercises  Handouts Provided Include  Goals printed out  Learning Style & Readiness for Change Teaching method utilized: Visual & Auditory  Demonstrated degree of understanding via: Teach Back  Readiness Level: contemplative Barriers to learning/adherence to lifestyle change: mobility, motivation  RD's Notes for next Visit  Patient progress toward chosen goals.   MONITORING & EVALUATION Dietary intake, weekly physical activity, body weight, and pre-op goals in 1 month.   Next Steps  Patient is to return to [facility] in one month for next SWL visit.

## 2023-05-03 ENCOUNTER — Encounter: Payer: Self-pay | Admitting: Allergy & Immunology

## 2023-05-03 MED ORDER — ALBUTEROL SULFATE HFA 108 (90 BASE) MCG/ACT IN AERS: INHALATION_SPRAY | RESPIRATORY_TRACT | 1 refills | Status: DC

## 2023-05-03 MED ORDER — MONTELUKAST SODIUM 10 MG PO TABS: 10.0000 mg | ORAL_TABLET | Freq: Every day | ORAL | 1 refills | Status: DC

## 2023-05-03 MED ORDER — BREZTRI AEROSPHERE 160-9-4.8 MCG/ACT IN AERO: 2.0000 | INHALATION_SPRAY | Freq: Two times a day (BID) | RESPIRATORY_TRACT | 1 refills | Status: DC

## 2023-05-04 ENCOUNTER — Encounter (HOSPITAL_BASED_OUTPATIENT_CLINIC_OR_DEPARTMENT_OTHER): Payer: PPO | Admitting: General Surgery

## 2023-05-04 DIAGNOSIS — I10 Essential (primary) hypertension: Secondary | ICD-10-CM | POA: Diagnosis not present

## 2023-05-04 DIAGNOSIS — E785 Hyperlipidemia, unspecified: Secondary | ICD-10-CM | POA: Diagnosis not present

## 2023-05-04 DIAGNOSIS — Z23 Encounter for immunization: Secondary | ICD-10-CM | POA: Diagnosis not present

## 2023-05-04 DIAGNOSIS — L89892 Pressure ulcer of other site, stage 2: Secondary | ICD-10-CM | POA: Diagnosis not present

## 2023-05-04 DIAGNOSIS — Z0000 Encounter for general adult medical examination without abnormal findings: Secondary | ICD-10-CM | POA: Diagnosis not present

## 2023-05-04 DIAGNOSIS — F339 Major depressive disorder, recurrent, unspecified: Secondary | ICD-10-CM | POA: Diagnosis not present

## 2023-05-04 DIAGNOSIS — J45909 Unspecified asthma, uncomplicated: Secondary | ICD-10-CM | POA: Diagnosis not present

## 2023-05-04 DIAGNOSIS — G7281 Critical illness myopathy: Secondary | ICD-10-CM | POA: Diagnosis not present

## 2023-05-04 DIAGNOSIS — L97522 Non-pressure chronic ulcer of other part of left foot with fat layer exposed: Secondary | ICD-10-CM | POA: Diagnosis not present

## 2023-05-04 DIAGNOSIS — Z6841 Body Mass Index (BMI) 40.0 and over, adult: Secondary | ICD-10-CM | POA: Diagnosis not present

## 2023-05-04 DIAGNOSIS — R7303 Prediabetes: Secondary | ICD-10-CM | POA: Diagnosis not present

## 2023-05-04 DIAGNOSIS — E8881 Metabolic syndrome: Secondary | ICD-10-CM | POA: Diagnosis not present

## 2023-05-04 DIAGNOSIS — J9610 Chronic respiratory failure, unspecified whether with hypoxia or hypercapnia: Secondary | ICD-10-CM | POA: Diagnosis not present

## 2023-05-04 DIAGNOSIS — L89623 Pressure ulcer of left heel, stage 3: Secondary | ICD-10-CM | POA: Diagnosis not present

## 2023-05-04 NOTE — Progress Notes (Signed)
19 Electronic Signature(s) Signed: 05/04/2023 12:27:44 PM By: [name] MD FACS Entered By: [name] on 05/04/2023 09:27:44 -------------------------------------------------------------------------------- Progress Note Details Patient Name: Date of Service: [name], TO [name] E. 05/04/2023 11:15 A M Medical Record Number: [id number] Patient Account Number:  [id number] Date of Birth/Sex: Treating RN: 1975-09-09 (47 y.o. M) Primary Care Provider: Dorinda Hill Other Clinician: Referring Provider: Treating Provider/Extender: Jana Hakim in Treatment: 146 Subjective Chief Complaint Information obtained from Patient Bilateral Plantar Foot Ulcers History of Present Illness (HPI) Wounds are12/03/2020 upon evaluation today patient presents for initial inspection here in our clinic concerning issues he has been having with the bottoms of his feet bilaterally. He states these actually occurred as wounds when he was hospitalized for 5 months secondary to Covid. He was apparently with tilting bed where he was in an upright position quite frequently and apparently this occurred in some way shape or form during that time. Fortunately there is no sign of active infection at this time. No fevers, chills, nausea, vomiting, or diarrhea. With that being said he still has substantial wounds on the plantar aspects of his feet Theragen require quite a bit of work to get these to heal. He has been using Santyl currently though that is been problematic both in receiving the medication as well as actually paid for it as it is become quite expensive. Prior to the experience with Covid the patient really did not have any major medical problems other than hypertension he does have some mild generalized weakness following the Covid experience. 07/22/2020 on evaluation today patient appears to be doing okay in regard to his foot ulcers I feel like the wound beds are showing signs of better improvement that I do believe the Iodoflex is helping in this regard. With that being said he does have a lot of drainage currently and this is somewhat blue/green in nature which is consistent with Pseudomonas. I do think a culture today would be appropriate for [place] to evaluate and see if that is indeed the case I would likely start him on antibiotic orally as well he is  not allergic to Cipro knows of no issues he has had in the past 12/21; patient was admitted to the clinic earlier this month with bilateral presumed pressure ulcers on the bottom of his feet apparently related to excessive pressure from a tilt table arrangement in the intensive care unit. Patient relates this to being on ECMO but I am not really sure that is exactly related to that. I must say I have never seen anything like this. He has fairly extensive full-thickness wounds extending from his heel towards his midfoot mostly centered laterally. There is already been some healing distally. He does not appear to have an arterial issue. He has been using gentamicin to the wound surfaces with Iodoflex to help with ongoing debridement 1/6; this is a patient with pressure ulcers on the bottom of his feet related to excessive pressure from a standing position in the intensive care unit. He is complaining of a lot of pain in the right heel. He is not a diabetic. 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 We have been using Iodoflex to help prepare the surfaces of both wounds for an advanced treatment product. He is nonambulatory spending most of his time in a wheelchair I have asked him not to propel the wheelchair with his heels 1/13; in general his wounds look better not much surface area change we have  in/yd) 1 x Per Day/30 Days Discharge Instructions: Secure with Kerlix as directed. 05/04/2023: The wounds are stable this week. No significant change. There is a little bit of callus buildup around the edges of each site and some thin slough on the surface on the left foot. I used a curette to debride callus, eschar, and slough from his wounds. We will continue topical gentamicin and silver alginate to both. The heel off loader shoe is not really providing enough offloading for the left foot so we will put him back in a peg assist. Follow-up in 1 week. Electronic Signature(s) Signed: 05/04/2023 12:32:34 PM By: [name] MD [name], TONY9/26/2024 12:32:34 PM By: [name] MD FACS SignedBea [name] ([id number]) [id number] Page 17 of 19 Entered By: [name] on 05/04/2023 09:32:34 -------------------------------------------------------------------------------- HxROS Details Patient Name: Date of Service: [name] TO [name] E. 05/04/2023 11:15 A M Medical Record Number: [id number] Patient Account Number: [id number] Date of Birth/Sex: Treating RN: 10/09/1975 (47 y.o. M) Primary Care Provider: Dorinda Hill Other Clinician: Referring Provider: Treating Provider/Extender: Jana Hakim in Treatment: 146 Information Obtained From Patient Constitutional Symptoms (General Health) Medical History: Past Medical History Notes: COVID PNA 07/22/2019-11/14/2019 [facility] ECMO,  foot drop left foot , Eyes Medical History: Negative for: Cataracts; Glaucoma; Optic Neuritis Ear/Nose/Mouth/Throat Medical History: Negative for: Chronic sinus problems/congestion; Middle ear problems Hematologic/Lymphatic Medical History: Negative for: Anemia; Hemophilia; Human Immunodeficiency Virus; Lymphedema; Sickle Cell Disease Respiratory Medical History: Positive for: Asthma Negative for: Aspiration; Chronic Obstructive Pulmonary Disease (COPD); Pneumothorax; Sleep Apnea; Tuberculosis Cardiovascular Medical History: Positive for: Angina - with COVID; Hypertension Negative for: Arrhythmia; Congestive Heart Failure; Coronary Artery Disease; Deep Vein Thrombosis; Hypotension; Myocardial Infarction; Peripheral Arterial Disease; Peripheral Venous Disease; Phlebitis; Vasculitis Gastrointestinal Medical History: Negative for: Cirrhosis ; Colitis; Crohns; Hepatitis A; Hepatitis B; Hepatitis C Endocrine Medical History: Negative for: Type I Diabetes; Type II Diabetes Genitourinary Medical History: Negative for: End Stage Renal Disease Past Medical History Notes: kidney stone Immunological Medical History: Negative for: Lupus Erythematosus; Raynauds; Scleroderma DONTARIOUS, BRANOM ([id number]) [id number] Page 18 of 19 Integumentary (Skin) Medical History: Negative for: History of Burn Musculoskeletal Medical History: Negative for: Gout; Rheumatoid Arthritis; Osteoarthritis; Osteomyelitis Neurologic Medical History: Negative for: Dementia; Neuropathy; Quadriplegia; Paraplegia; Seizure Disorder Oncologic Medical History: Negative for: Received Chemotherapy; Received Radiation Psychiatric Medical History: Negative for: Anorexia/bulimia; Confinement Anxiety Past Medical History Notes: anxiety Immunizations Pneumococcal Vaccine: Received Pneumococcal Vaccination: No Implantable Devices None Hospitalization / Surgery History Type of  Hospitalization/Surgery COVID PNA 07/22/2019- 11/14/2019 03/27/2020 wound debridement/ skin graft Family and Social History Cancer: Yes - Maternal Grandparents; Diabetes: Yes - Father,Paternal Grandparents; Heart Disease: Yes - Maternal Grandparents; Hereditary Spherocytosis: No; Hypertension: Yes - Father,Paternal Grandparents; Kidney Disease: No; Lung Disease: Yes - Siblings; Seizures: No; Stroke: No; Thyroid Problems: No; Tuberculosis: No; Never smoker; Marital Status - Married; Alcohol Use: Never; Drug Use: No History; Caffeine Use: Daily - tea, soda; Financial Concerns: No; Food, Clothing or Shelter Needs: No; Support System Lacking: No; Transportation Concerns: No Electronic Signature(s) Signed: 05/04/2023 12:58:46 PM By: [name] MD FACS Entered By: [name] on 05/04/2023 09:29:01 -------------------------------------------------------------------------------- SuperBill Details Patient Name: Date of Service: [name], TO [name] E. 05/04/2023 Medical Record Number: [id number] Patient Account Number: [id number] Date of Birth/Sex: Treating RN: 11/07/1975 (47 y.o. M) Primary Care Provider: Dorinda Hill Other Clinician: Referring Provider: Treating Provider/Extender: Jana Hakim in Treatment: 146 Diagnosis Coding ICD-10 Codes Code Description [phone] Non-pressure chronic ulcer of other part of right foot  in/yd) 1 x Per Day/30 Days Discharge Instructions: Secure with Kerlix as directed. 05/04/2023: The wounds are stable this week. No significant change. There is a little bit of callus buildup around the edges of each site and some thin slough on the surface on the left foot. I used a curette to debride callus, eschar, and slough from his wounds. We will continue topical gentamicin and silver alginate to both. The heel off loader shoe is not really providing enough offloading for the left foot so we will put him back in a peg assist. Follow-up in 1 week. Electronic Signature(s) Signed: 05/04/2023 12:32:34 PM By: [name] MD [name], TONY9/26/2024 12:32:34 PM By: [name] MD FACS SignedBea [name] ([id number]) [id number] Page 17 of 19 Entered By: [name] on 05/04/2023 09:32:34 -------------------------------------------------------------------------------- HxROS Details Patient Name: Date of Service: [name] TO [name] E. 05/04/2023 11:15 A M Medical Record Number: [id number] Patient Account Number: [id number] Date of Birth/Sex: Treating RN: 10/09/1975 (47 y.o. M) Primary Care Provider: Dorinda Hill Other Clinician: Referring Provider: Treating Provider/Extender: Jana Hakim in Treatment: 146 Information Obtained From Patient Constitutional Symptoms (General Health) Medical History: Past Medical History Notes: COVID PNA 07/22/2019-11/14/2019 VENT ECMO,  foot drop left foot , Eyes Medical History: Negative for: Cataracts; Glaucoma; Optic Neuritis Ear/Nose/Mouth/Throat Medical History: Negative for: Chronic sinus problems/congestion; Middle ear problems Hematologic/Lymphatic Medical History: Negative for: Anemia; Hemophilia; Human Immunodeficiency Virus; Lymphedema; Sickle Cell Disease Respiratory Medical History: Positive for: Asthma Negative for: Aspiration; Chronic Obstructive Pulmonary Disease (COPD); Pneumothorax; Sleep Apnea; Tuberculosis Cardiovascular Medical History: Positive for: Angina - with COVID; Hypertension Negative for: Arrhythmia; Congestive Heart Failure; Coronary Artery Disease; Deep Vein Thrombosis; Hypotension; Myocardial Infarction; Peripheral Arterial Disease; Peripheral Venous Disease; Phlebitis; Vasculitis Gastrointestinal Medical History: Negative for: Cirrhosis ; Colitis; Crohns; Hepatitis A; Hepatitis B; Hepatitis C Endocrine Medical History: Negative for: Type I Diabetes; Type II Diabetes Genitourinary Medical History: Negative for: End Stage Renal Disease Past Medical History Notes: kidney stone Immunological Medical History: Negative for: Lupus Erythematosus; Raynauds; Scleroderma DONTARIOUS, BRANOM ([id number]) [id number] Page 18 of 19 Integumentary (Skin) Medical History: Negative for: History of Burn Musculoskeletal Medical History: Negative for: Gout; Rheumatoid Arthritis; Osteoarthritis; Osteomyelitis Neurologic Medical History: Negative for: Dementia; Neuropathy; Quadriplegia; Paraplegia; Seizure Disorder Oncologic Medical History: Negative for: Received Chemotherapy; Received Radiation Psychiatric Medical History: Negative for: Anorexia/bulimia; Confinement Anxiety Past Medical History Notes: anxiety Immunizations Pneumococcal Vaccine: Received Pneumococcal Vaccination: No Implantable Devices None Hospitalization / Surgery History Type of  Hospitalization/Surgery COVID PNA 07/22/2019- 11/14/2019 03/27/2020 wound debridement/ skin graft Family and Social History Cancer: Yes - Maternal Grandparents; Diabetes: Yes - Father,Paternal Grandparents; Heart Disease: Yes - Maternal Grandparents; Hereditary Spherocytosis: No; Hypertension: Yes - Father,Paternal Grandparents; Kidney Disease: No; Lung Disease: Yes - Siblings; Seizures: No; Stroke: No; Thyroid Problems: No; Tuberculosis: No; Never smoker; Marital Status - Married; Alcohol Use: Never; Drug Use: No History; Caffeine Use: Daily - tea, soda; Financial Concerns: No; Food, Clothing or Shelter Needs: No; Support System Lacking: No; Transportation Concerns: No Electronic Signature(s) Signed: 05/04/2023 12:58:46 PM By: [name] MD FACS Entered By: [name] on 05/04/2023 09:29:01 -------------------------------------------------------------------------------- SuperBill Details Patient Name: Date of Service: [name], TO [name] E. 05/04/2023 Medical Record Number: [id number] Patient Account Number: [id number] Date of Birth/Sex: Treating RN: 11/07/1975 (47 y.o. M) Primary Care Provider: Dorinda Hill Other Clinician: Referring Provider: Treating Provider/Extender: Jana Hakim in Treatment: 146 Diagnosis Coding ICD-10 Codes Code Description [phone] Non-pressure chronic ulcer of other part of right foot  not appear to result in any tissue breakdown. There is a little bit of slough on the surface and minimal eschar around the edges. 03/15/2023: The wound measured narrower today. There is some slough accumulation on the surface and some eschar around the edges. 03/23/2023: The length of the wound remains stable, but it continues to narrow. The skin that has covered the posterior aspect of the calcaneus is more robust and has better tensile integrity. Minimal slough and eschar accumulation. 03/30/2023: The wound length has decreased. This seems to be primarily due to more epithelium covering the posterior aspect of the site. Minimal slough and eschar buildup. 04/07/2023: The wound measured a little bit smaller again today. There is minimal slough accumulation with just a little bit of eschar and callus around the edges. 04/13/2023: The lateral aspect of his wound is starting to epithelialize. There is very minimal slough on  the surface. Moisture control is good. 04/20/2023: Continued, albeit extremely slow, encroachment of epithelium as of the wound surface. Minimal slough. Moisture balance is good. 04/27/2023: More epithelium is filling in from the heel area. There is a little bit of slough on the surface with some periwound callus and eschar. He did note a little bit of drainage coming from his right foot and there is a small crack in the skin extending medially from the main site of his prior ulceration. 05/04/2023: The wounds are stable this week. No significant change. There is a little bit of callus buildup around the edges of each site and some thin slough on the surface on the left foot. Electronic Signature(s) Signed: 05/04/2023 12:28:54 PM By: [name] MD FACS Entered By: [name] on 05/04/2023 09:81:19 -------------------------------------------------------------------------------- Physical Exam Details Patient Name: Date of Service: [name], TO [name] E. 05/04/2023 11:15 A M Medical Record Number: [id number] Patient Account Number: [id number] Date of Birth/Sex: Treating RN: 1976-06-26 (47 y.o. M) Primary Care Provider: Dorinda Hill Other Clinician: Referring Provider: Treating Provider/Extender: Jana Hakim in Treatment: 146 Constitutional . . . . no acute distress. Respiratory Normal work of breathing on room air. Notes 05/04/2023: The wounds are stable this week. No significant change. There is a little bit of callus buildup around the edges of each site and some thin slough on the surface on the left foot. Electronic Signature(s) Signed: 05/04/2023 12:29:27 PM By: [name] MD FACS Entered By: [name] on 05/04/2023 13:08:65 [name] ([id number]) [id number] Page 8 of 19 -------------------------------------------------------------------------------- Physician Orders Details Patient Name: Date of Service: [name] [name] E. 05/04/2023 11:15 A M Medical Record Number: [id number] Patient Account Number: [id number] Date of Birth/Sex: Treating RN: 01-14-1976 (47 y.o. [name] Primary Care Provider: Dorinda Hill Other Clinician: Referring Provider: Treating Provider/Extender: Jana Hakim in Treatment: 146 The following information was scribed by: Zenaida Deed The information was scribed for: [name] Verbal / Phone Orders: No Diagnosis Coding ICD-10 Coding Code Description [phone] Non-pressure chronic ulcer of other part of right foot with fat layer exposed L97.522 Non-pressure chronic ulcer of other part of left foot with fat layer exposed Follow-up Appointments ppointment in 1 week. - Dr. Lady Gary Room 3 - Return A Anesthetic Wound #2 Left Calcaneus (In clinic) Topical Lidocaine 4% applied to wound bed - In clinic Bathing/ Shower/ Hygiene May shower and wash wound with soap and water. Edema Control - Lymphedema / SCD / Other Bilateral Lower Extremities Avoid standing for long periods of time. Moisturize legs daily. - as needed  19 Electronic Signature(s) Signed: 05/04/2023 12:27:44 PM By: [name] MD FACS Entered By: [name] on 05/04/2023 09:27:44 -------------------------------------------------------------------------------- Progress Note Details Patient Name: Date of Service: [name], TO [name] E. 05/04/2023 11:15 A M Medical Record Number: [id number] Patient Account Number:  [id number] Date of Birth/Sex: Treating RN: 1975-09-09 (47 y.o. M) Primary Care Provider: Dorinda Hill Other Clinician: Referring Provider: Treating Provider/Extender: Jana Hakim in Treatment: 146 Subjective Chief Complaint Information obtained from Patient Bilateral Plantar Foot Ulcers History of Present Illness (HPI) Wounds are12/03/2020 upon evaluation today patient presents for initial inspection here in our clinic concerning issues he has been having with the bottoms of his feet bilaterally. He states these actually occurred as wounds when he was hospitalized for 5 months secondary to Covid. He was apparently with tilting bed where he was in an upright position quite frequently and apparently this occurred in some way shape or form during that time. Fortunately there is no sign of active infection at this time. No fevers, chills, nausea, vomiting, or diarrhea. With that being said he still has substantial wounds on the plantar aspects of his feet Theragen require quite a bit of work to get these to heal. He has been using Santyl currently though that is been problematic both in receiving the medication as well as actually paid for it as it is become quite expensive. Prior to the experience with Covid the patient really did not have any major medical problems other than hypertension he does have some mild generalized weakness following the Covid experience. 07/22/2020 on evaluation today patient appears to be doing okay in regard to his foot ulcers I feel like the wound beds are showing signs of better improvement that I do believe the Iodoflex is helping in this regard. With that being said he does have a lot of drainage currently and this is somewhat blue/green in nature which is consistent with Pseudomonas. I do think a culture today would be appropriate for [place] to evaluate and see if that is indeed the case I would likely start him on antibiotic orally as well he is  not allergic to Cipro knows of no issues he has had in the past 12/21; patient was admitted to the clinic earlier this month with bilateral presumed pressure ulcers on the bottom of his feet apparently related to excessive pressure from a tilt table arrangement in the intensive care unit. Patient relates this to being on ECMO but I am not really sure that is exactly related to that. I must say I have never seen anything like this. He has fairly extensive full-thickness wounds extending from his heel towards his midfoot mostly centered laterally. There is already been some healing distally. He does not appear to have an arterial issue. He has been using gentamicin to the wound surfaces with Iodoflex to help with ongoing debridement 1/6; this is a patient with pressure ulcers on the bottom of his feet related to excessive pressure from a standing position in the intensive care unit. He is complaining of a lot of pain in the right heel. He is not a diabetic. 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 We have been using Iodoflex to help prepare the surfaces of both wounds for an advanced treatment product. He is nonambulatory spending most of his time in a wheelchair I have asked him not to propel the wheelchair with his heels 1/13; in general his wounds look better not much surface area change we have  not appear to result in any tissue breakdown. There is a little bit of slough on the surface and minimal eschar around the edges. 03/15/2023: The wound measured narrower today. There is some slough accumulation on the surface and some eschar around the edges. 03/23/2023: The length of the wound remains stable, but it continues to narrow. The skin that has covered the posterior aspect of the calcaneus is more robust and has better tensile integrity. Minimal slough and eschar accumulation. 03/30/2023: The wound length has decreased. This seems to be primarily due to more epithelium covering the posterior aspect of the site. Minimal slough and eschar buildup. 04/07/2023: The wound measured a little bit smaller again today. There is minimal slough accumulation with just a little bit of eschar and callus around the edges. 04/13/2023: The lateral aspect of his wound is starting to epithelialize. There is very minimal slough on  the surface. Moisture control is good. 04/20/2023: Continued, albeit extremely slow, encroachment of epithelium as of the wound surface. Minimal slough. Moisture balance is good. 04/27/2023: More epithelium is filling in from the heel area. There is a little bit of slough on the surface with some periwound callus and eschar. He did note a little bit of drainage coming from his right foot and there is a small crack in the skin extending medially from the main site of his prior ulceration. 05/04/2023: The wounds are stable this week. No significant change. There is a little bit of callus buildup around the edges of each site and some thin slough on the surface on the left foot. Electronic Signature(s) Signed: 05/04/2023 12:28:54 PM By: [name] MD FACS Entered By: [name] on 05/04/2023 09:81:19 -------------------------------------------------------------------------------- Physical Exam Details Patient Name: Date of Service: [name], TO [name] E. 05/04/2023 11:15 A M Medical Record Number: [id number] Patient Account Number: [id number] Date of Birth/Sex: Treating RN: 1976-06-26 (47 y.o. M) Primary Care Provider: Dorinda Hill Other Clinician: Referring Provider: Treating Provider/Extender: Jana Hakim in Treatment: 146 Constitutional . . . . no acute distress. Respiratory Normal work of breathing on room air. Notes 05/04/2023: The wounds are stable this week. No significant change. There is a little bit of callus buildup around the edges of each site and some thin slough on the surface on the left foot. Electronic Signature(s) Signed: 05/04/2023 12:29:27 PM By: [name] MD FACS Entered By: [name] on 05/04/2023 13:08:65 [name] ([id number]) [id number] Page 8 of 19 -------------------------------------------------------------------------------- Physician Orders Details Patient Name: Date of Service: [name] [name] E. 05/04/2023 11:15 A M Medical Record Number: [id number] Patient Account Number: [id number] Date of Birth/Sex: Treating RN: 01-14-1976 (47 y.o. [name] Primary Care Provider: Dorinda Hill Other Clinician: Referring Provider: Treating Provider/Extender: Jana Hakim in Treatment: 146 The following information was scribed by: Zenaida Deed The information was scribed for: [name] Verbal / Phone Orders: No Diagnosis Coding ICD-10 Coding Code Description [phone] Non-pressure chronic ulcer of other part of right foot with fat layer exposed L97.522 Non-pressure chronic ulcer of other part of left foot with fat layer exposed Follow-up Appointments ppointment in 1 week. - Dr. Lady [facility] Room 3 - Return A Anesthetic Wound #2 Left Calcaneus (In clinic) Topical Lidocaine 4% applied to wound bed - In clinic Bathing/ Shower/ Hygiene May shower and wash wound with soap and water. Edema Control - Lymphedema / SCD / Other Bilateral Lower Extremities Avoid standing for long periods of time. Moisturize legs daily. - as needed  in/yd) 1 x Per Day/30 Days Discharge Instructions: Secure with Kerlix as directed. 05/04/2023: The wounds are stable this week. No significant change. There is a little bit of callus buildup around the edges of each site and some thin slough on the surface on the left foot. I used a curette to debride callus, eschar, and slough from his wounds. We will continue topical gentamicin and silver alginate to both. The heel off loader shoe is not really providing enough offloading for the left foot so we will put him back in a peg assist. Follow-up in 1 week. Electronic Signature(s) Signed: 05/04/2023 12:32:34 PM By: [name] MD [name], TONY9/26/2024 12:32:34 PM By: [name] MD FACS SignedBea [name] ([id number]) [id number] Page 17 of 19 Entered By: [name] on 05/04/2023 09:32:34 -------------------------------------------------------------------------------- HxROS Details Patient Name: Date of Service: [name] TO [name] E. 05/04/2023 11:15 A M Medical Record Number: [id number] Patient Account Number: [id number] Date of Birth/Sex: Treating RN: 10/09/1975 (47 y.o. M) Primary Care Provider: Dorinda Hill Other Clinician: Referring Provider: Treating Provider/Extender: Jana Hakim in Treatment: 146 Information Obtained From Patient Constitutional Symptoms (General Health) Medical History: Past Medical History Notes: COVID PNA 07/22/2019-11/14/2019 VENT ECMO,  foot drop left foot , Eyes Medical History: Negative for: Cataracts; Glaucoma; Optic Neuritis Ear/Nose/Mouth/Throat Medical History: Negative for: Chronic sinus problems/congestion; Middle ear problems Hematologic/Lymphatic Medical History: Negative for: Anemia; Hemophilia; Human Immunodeficiency Virus; Lymphedema; Sickle Cell Disease Respiratory Medical History: Positive for: Asthma Negative for: Aspiration; Chronic Obstructive Pulmonary Disease (COPD); Pneumothorax; Sleep Apnea; Tuberculosis Cardiovascular Medical History: Positive for: Angina - with COVID; Hypertension Negative for: Arrhythmia; Congestive Heart Failure; Coronary Artery Disease; Deep Vein Thrombosis; Hypotension; Myocardial Infarction; Peripheral Arterial Disease; Peripheral Venous Disease; Phlebitis; Vasculitis Gastrointestinal Medical History: Negative for: Cirrhosis ; Colitis; Crohns; Hepatitis A; Hepatitis B; Hepatitis C Endocrine Medical History: Negative for: Type I Diabetes; Type II Diabetes Genitourinary Medical History: Negative for: End Stage Renal Disease Past Medical History Notes: kidney stone Immunological Medical History: Negative for: Lupus Erythematosus; Raynauds; Scleroderma DONTARIOUS, BRANOM ([id number]) [id number] Page 18 of 19 Integumentary (Skin) Medical History: Negative for: History of Burn Musculoskeletal Medical History: Negative for: Gout; Rheumatoid Arthritis; Osteoarthritis; Osteomyelitis Neurologic Medical History: Negative for: Dementia; Neuropathy; Quadriplegia; Paraplegia; Seizure Disorder Oncologic Medical History: Negative for: Received Chemotherapy; Received Radiation Psychiatric Medical History: Negative for: Anorexia/bulimia; Confinement Anxiety Past Medical History Notes: anxiety Immunizations Pneumococcal Vaccine: Received Pneumococcal Vaccination: No Implantable Devices None Hospitalization / Surgery History Type of  Hospitalization/Surgery COVID PNA 07/22/2019- 11/14/2019 03/27/2020 wound debridement/ skin graft Family and Social History Cancer: Yes - Maternal Grandparents; Diabetes: Yes - Father,Paternal Grandparents; Heart Disease: Yes - Maternal Grandparents; Hereditary Spherocytosis: No; Hypertension: Yes - Father,Paternal Grandparents; Kidney Disease: No; Lung Disease: Yes - Siblings; Seizures: No; Stroke: No; Thyroid Problems: No; Tuberculosis: No; Never smoker; Marital Status - Married; Alcohol Use: Never; Drug Use: No History; Caffeine Use: Daily - tea, soda; Financial Concerns: No; Food, Clothing or Shelter Needs: No; Support System Lacking: No; Transportation Concerns: No Electronic Signature(s) Signed: 05/04/2023 12:58:46 PM By: [name] MD FACS Entered By: [name] on 05/04/2023 09:29:01 -------------------------------------------------------------------------------- SuperBill Details Patient Name: Date of Service: [name], TO [name] E. 05/04/2023 Medical Record Number: [id number] Patient Account Number: [id number] Date of Birth/Sex: Treating RN: 11/07/1975 (47 y.o. M) Primary Care Provider: Dorinda Hill Other Clinician: Referring Provider: Treating Provider/Extender: Jana Hakim in Treatment: 146 Diagnosis Coding ICD-10 Codes Code Description [phone] Non-pressure chronic ulcer of other part of right foot  not appear to result in any tissue breakdown. There is a little bit of slough on the surface and minimal eschar around the edges. 03/15/2023: The wound measured narrower today. There is some slough accumulation on the surface and some eschar around the edges. 03/23/2023: The length of the wound remains stable, but it continues to narrow. The skin that has covered the posterior aspect of the calcaneus is more robust and has better tensile integrity. Minimal slough and eschar accumulation. 03/30/2023: The wound length has decreased. This seems to be primarily due to more epithelium covering the posterior aspect of the site. Minimal slough and eschar buildup. 04/07/2023: The wound measured a little bit smaller again today. There is minimal slough accumulation with just a little bit of eschar and callus around the edges. 04/13/2023: The lateral aspect of his wound is starting to epithelialize. There is very minimal slough on  the surface. Moisture control is good. 04/20/2023: Continued, albeit extremely slow, encroachment of epithelium as of the wound surface. Minimal slough. Moisture balance is good. 04/27/2023: More epithelium is filling in from the heel area. There is a little bit of slough on the surface with some periwound callus and eschar. He did note a little bit of drainage coming from his right foot and there is a small crack in the skin extending medially from the main site of his prior ulceration. 05/04/2023: The wounds are stable this week. No significant change. There is a little bit of callus buildup around the edges of each site and some thin slough on the surface on the left foot. Electronic Signature(s) Signed: 05/04/2023 12:28:54 PM By: [name] MD FACS Entered By: [name] on 05/04/2023 09:81:19 -------------------------------------------------------------------------------- Physical Exam Details Patient Name: Date of Service: [name], TO [name] E. 05/04/2023 11:15 A M Medical Record Number: [id number] Patient Account Number: [id number] Date of Birth/Sex: Treating RN: 1976-06-26 (47 y.o. M) Primary Care Provider: Dorinda Hill Other Clinician: Referring Provider: Treating Provider/Extender: Jana Hakim in Treatment: 146 Constitutional . . . . no acute distress. Respiratory Normal work of breathing on room air. Notes 05/04/2023: The wounds are stable this week. No significant change. There is a little bit of callus buildup around the edges of each site and some thin slough on the surface on the left foot. Electronic Signature(s) Signed: 05/04/2023 12:29:27 PM By: [name] MD FACS Entered By: [name] on 05/04/2023 13:08:65 [name] ([id number]) [id number] Page 8 of 19 -------------------------------------------------------------------------------- Physician Orders Details Patient Name: Date of Service: [name] [name] E. 05/04/2023 11:15 A M Medical Record Number: [id number] Patient Account Number: [id number] Date of Birth/Sex: Treating RN: 01-14-1976 (47 y.o. [name] Primary Care Provider: Dorinda Hill Other Clinician: Referring Provider: Treating Provider/Extender: Jana Hakim in Treatment: 146 The following information was scribed by: Zenaida Deed The information was scribed for: [name] Verbal / Phone Orders: No Diagnosis Coding ICD-10 Coding Code Description [phone] Non-pressure chronic ulcer of other part of right foot with fat layer exposed L97.522 Non-pressure chronic ulcer of other part of left foot with fat layer exposed Follow-up Appointments ppointment in 1 week. - Dr. Lady [facility] Room 3 - Return A Anesthetic Wound #2 Left Calcaneus (In clinic) Topical Lidocaine 4% applied to wound bed - In clinic Bathing/ Shower/ Hygiene May shower and wash wound with soap and water. Edema Control - Lymphedema / SCD / Other Bilateral Lower Extremities Avoid standing for long periods of time. Moisturize legs daily. - as needed  19 Electronic Signature(s) Signed: 05/04/2023 12:27:44 PM By: [name] MD FACS Entered By: [name] on 05/04/2023 09:27:44 -------------------------------------------------------------------------------- Progress Note Details Patient Name: Date of Service: [name], TO [name] E. 05/04/2023 11:15 A M Medical Record Number: [id number] Patient Account Number:  [id number] Date of Birth/Sex: Treating RN: 1975-09-09 (47 y.o. M) Primary Care Provider: Dorinda Hill Other Clinician: Referring Provider: Treating Provider/Extender: Jana Hakim in Treatment: 146 Subjective Chief Complaint Information obtained from Patient Bilateral Plantar Foot Ulcers History of Present Illness (HPI) Wounds are12/03/2020 upon evaluation today patient presents for initial inspection here in our clinic concerning issues he has been having with the bottoms of his feet bilaterally. He states these actually occurred as wounds when he was hospitalized for 5 months secondary to Covid. He was apparently with tilting bed where he was in an upright position quite frequently and apparently this occurred in some way shape or form during that time. Fortunately there is no sign of active infection at this time. No fevers, chills, nausea, vomiting, or diarrhea. With that being said he still has substantial wounds on the plantar aspects of his feet Theragen require quite a bit of work to get these to heal. He has been using Santyl currently though that is been problematic both in receiving the medication as well as actually paid for it as it is become quite expensive. Prior to the experience with Covid the patient really did not have any major medical problems other than hypertension he does have some mild generalized weakness following the Covid experience. 07/22/2020 on evaluation today patient appears to be doing okay in regard to his foot ulcers I feel like the wound beds are showing signs of better improvement that I do believe the Iodoflex is helping in this regard. With that being said he does have a lot of drainage currently and this is somewhat blue/green in nature which is consistent with Pseudomonas. I do think a culture today would be appropriate for [place] to evaluate and see if that is indeed the case I would likely start him on antibiotic orally as well he is  not allergic to Cipro knows of no issues he has had in the past 12/21; patient was admitted to the clinic earlier this month with bilateral presumed pressure ulcers on the bottom of his feet apparently related to excessive pressure from a tilt table arrangement in the intensive care unit. Patient relates this to being on ECMO but I am not really sure that is exactly related to that. I must say I have never seen anything like this. He has fairly extensive full-thickness wounds extending from his heel towards his midfoot mostly centered laterally. There is already been some healing distally. He does not appear to have an arterial issue. He has been using gentamicin to the wound surfaces with Iodoflex to help with ongoing debridement 1/6; this is a patient with pressure ulcers on the bottom of his feet related to excessive pressure from a standing position in the intensive care unit. He is complaining of a lot of pain in the right heel. He is not a diabetic. 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 We have been using Iodoflex to help prepare the surfaces of both wounds for an advanced treatment product. He is nonambulatory spending most of his time in a wheelchair I have asked him not to propel the wheelchair with his heels 1/13; in general his wounds look better not much surface area change we have  in/yd) 1 x Per Day/30 Days Discharge Instructions: Secure with Kerlix as directed. 05/04/2023: The wounds are stable this week. No significant change. There is a little bit of callus buildup around the edges of each site and some thin slough on the surface on the left foot. I used a curette to debride callus, eschar, and slough from his wounds. We will continue topical gentamicin and silver alginate to both. The heel off loader shoe is not really providing enough offloading for the left foot so we will put him back in a peg assist. Follow-up in 1 week. Electronic Signature(s) Signed: 05/04/2023 12:32:34 PM By: [name] MD [name], TONY9/26/2024 12:32:34 PM By: [name] MD FACS SignedBea [name] ([id number]) [id number] Page 17 of 19 Entered By: [name] on 05/04/2023 09:32:34 -------------------------------------------------------------------------------- HxROS Details Patient Name: Date of Service: [name] TO [name] E. 05/04/2023 11:15 A M Medical Record Number: [id number] Patient Account Number: [id number] Date of Birth/Sex: Treating RN: 10/09/1975 (47 y.o. M) Primary Care Provider: Dorinda Hill Other Clinician: Referring Provider: Treating Provider/Extender: Jana Hakim in Treatment: 146 Information Obtained From Patient Constitutional Symptoms (General Health) Medical History: Past Medical History Notes: COVID PNA 07/22/2019-11/14/2019 VENT ECMO,  foot drop left foot , Eyes Medical History: Negative for: Cataracts; Glaucoma; Optic Neuritis Ear/Nose/Mouth/Throat Medical History: Negative for: Chronic sinus problems/congestion; Middle ear problems Hematologic/Lymphatic Medical History: Negative for: Anemia; Hemophilia; Human Immunodeficiency Virus; Lymphedema; Sickle Cell Disease Respiratory Medical History: Positive for: Asthma Negative for: Aspiration; Chronic Obstructive Pulmonary Disease (COPD); Pneumothorax; Sleep Apnea; Tuberculosis Cardiovascular Medical History: Positive for: Angina - with COVID; Hypertension Negative for: Arrhythmia; Congestive Heart Failure; Coronary Artery Disease; Deep Vein Thrombosis; Hypotension; Myocardial Infarction; Peripheral Arterial Disease; Peripheral Venous Disease; Phlebitis; Vasculitis Gastrointestinal Medical History: Negative for: Cirrhosis ; Colitis; Crohns; Hepatitis A; Hepatitis B; Hepatitis C Endocrine Medical History: Negative for: Type I Diabetes; Type II Diabetes Genitourinary Medical History: Negative for: End Stage Renal Disease Past Medical History Notes: kidney stone Immunological Medical History: Negative for: Lupus Erythematosus; Raynauds; Scleroderma DONTARIOUS, BRANOM ([id number]) [id number] Page 18 of 19 Integumentary (Skin) Medical History: Negative for: History of Burn Musculoskeletal Medical History: Negative for: Gout; Rheumatoid Arthritis; Osteoarthritis; Osteomyelitis Neurologic Medical History: Negative for: Dementia; Neuropathy; Quadriplegia; Paraplegia; Seizure Disorder Oncologic Medical History: Negative for: Received Chemotherapy; Received Radiation Psychiatric Medical History: Negative for: Anorexia/bulimia; Confinement Anxiety Past Medical History Notes: anxiety Immunizations Pneumococcal Vaccine: Received Pneumococcal Vaccination: No Implantable Devices None Hospitalization / Surgery History Type of  Hospitalization/Surgery COVID PNA 07/22/2019- 11/14/2019 03/27/2020 wound debridement/ skin graft Family and Social History Cancer: Yes - Maternal Grandparents; Diabetes: Yes - Father,Paternal Grandparents; Heart Disease: Yes - Maternal Grandparents; Hereditary Spherocytosis: No; Hypertension: Yes - Father,Paternal Grandparents; Kidney Disease: No; Lung Disease: Yes - Siblings; Seizures: No; Stroke: No; Thyroid Problems: No; Tuberculosis: No; Never smoker; Marital Status - Married; Alcohol Use: Never; Drug Use: No History; Caffeine Use: Daily - tea, soda; Financial Concerns: No; Food, Clothing or Shelter Needs: No; Support System Lacking: No; Transportation Concerns: No Electronic Signature(s) Signed: 05/04/2023 12:58:46 PM By: [name] MD FACS Entered By: [name] on 05/04/2023 09:29:01 -------------------------------------------------------------------------------- SuperBill Details Patient Name: Date of Service: [name], TO [name] E. 05/04/2023 Medical Record Number: [id number] Patient Account Number: [id number] Date of Birth/Sex: Treating RN: 11/07/1975 (47 y.o. M) Primary Care Provider: Dorinda Hill Other Clinician: Referring Provider: Treating Provider/Extender: Jana Hakim in Treatment: 146 Diagnosis Coding ICD-10 Codes Code Description [phone] Non-pressure chronic ulcer of other part of right foot  19 Electronic Signature(s) Signed: 05/04/2023 12:27:44 PM By: [name] MD FACS Entered By: [name] on 05/04/2023 09:27:44 -------------------------------------------------------------------------------- Progress Note Details Patient Name: Date of Service: [name], TO [name] E. 05/04/2023 11:15 A M Medical Record Number: [id number] Patient Account Number:  [id number] Date of Birth/Sex: Treating RN: 1975-09-09 (47 y.o. M) Primary Care Provider: Dorinda Hill Other Clinician: Referring Provider: Treating Provider/Extender: Jana Hakim in Treatment: 146 Subjective Chief Complaint Information obtained from Patient Bilateral Plantar Foot Ulcers History of Present Illness (HPI) Wounds are12/03/2020 upon evaluation today patient presents for initial inspection here in our clinic concerning issues he has been having with the bottoms of his feet bilaterally. He states these actually occurred as wounds when he was hospitalized for 5 months secondary to Covid. He was apparently with tilting bed where he was in an upright position quite frequently and apparently this occurred in some way shape or form during that time. Fortunately there is no sign of active infection at this time. No fevers, chills, nausea, vomiting, or diarrhea. With that being said he still has substantial wounds on the plantar aspects of his feet Theragen require quite a bit of work to get these to heal. He has been using Santyl currently though that is been problematic both in receiving the medication as well as actually paid for it as it is become quite expensive. Prior to the experience with Covid the patient really did not have any major medical problems other than hypertension he does have some mild generalized weakness following the Covid experience. 07/22/2020 on evaluation today patient appears to be doing okay in regard to his foot ulcers I feel like the wound beds are showing signs of better improvement that I do believe the Iodoflex is helping in this regard. With that being said he does have a lot of drainage currently and this is somewhat blue/green in nature which is consistent with Pseudomonas. I do think a culture today would be appropriate for [place] to evaluate and see if that is indeed the case I would likely start him on antibiotic orally as well he is  not allergic to Cipro knows of no issues he has had in the past 12/21; patient was admitted to the clinic earlier this month with bilateral presumed pressure ulcers on the bottom of his feet apparently related to excessive pressure from a tilt table arrangement in the intensive care unit. Patient relates this to being on ECMO but I am not really sure that is exactly related to that. I must say I have never seen anything like this. He has fairly extensive full-thickness wounds extending from his heel towards his midfoot mostly centered laterally. There is already been some healing distally. He does not appear to have an arterial issue. He has been using gentamicin to the wound surfaces with Iodoflex to help with ongoing debridement 1/6; this is a patient with pressure ulcers on the bottom of his feet related to excessive pressure from a standing position in the intensive care unit. He is complaining of a lot of pain in the right heel. He is not a diabetic. 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 We have been using Iodoflex to help prepare the surfaces of both wounds for an advanced treatment product. He is nonambulatory spending most of his time in a wheelchair I have asked him not to propel the wheelchair with his heels 1/13; in general his wounds look better not much surface area change we have  19 Electronic Signature(s) Signed: 05/04/2023 12:27:44 PM By: [name] MD FACS Entered By: [name] on 05/04/2023 09:27:44 -------------------------------------------------------------------------------- Progress Note Details Patient Name: Date of Service: [name], TO [name] E. 05/04/2023 11:15 A M Medical Record Number: [id number] Patient Account Number:  [id number] Date of Birth/Sex: Treating RN: 1975-09-09 (47 y.o. M) Primary Care Provider: Dorinda Hill Other Clinician: Referring Provider: Treating Provider/Extender: Jana Hakim in Treatment: 146 Subjective Chief Complaint Information obtained from Patient Bilateral Plantar Foot Ulcers History of Present Illness (HPI) Wounds are12/03/2020 upon evaluation today patient presents for initial inspection here in our clinic concerning issues he has been having with the bottoms of his feet bilaterally. He states these actually occurred as wounds when he was hospitalized for 5 months secondary to Covid. He was apparently with tilting bed where he was in an upright position quite frequently and apparently this occurred in some way shape or form during that time. Fortunately there is no sign of active infection at this time. No fevers, chills, nausea, vomiting, or diarrhea. With that being said he still has substantial wounds on the plantar aspects of his feet Theragen require quite a bit of work to get these to heal. He has been using Santyl currently though that is been problematic both in receiving the medication as well as actually paid for it as it is become quite expensive. Prior to the experience with Covid the patient really did not have any major medical problems other than hypertension he does have some mild generalized weakness following the Covid experience. 07/22/2020 on evaluation today patient appears to be doing okay in regard to his foot ulcers I feel like the wound beds are showing signs of better improvement that I do believe the Iodoflex is helping in this regard. With that being said he does have a lot of drainage currently and this is somewhat blue/green in nature which is consistent with Pseudomonas. I do think a culture today would be appropriate for [place] to evaluate and see if that is indeed the case I would likely start him on antibiotic orally as well he is  not allergic to Cipro knows of no issues he has had in the past 12/21; patient was admitted to the clinic earlier this month with bilateral presumed pressure ulcers on the bottom of his feet apparently related to excessive pressure from a tilt table arrangement in the intensive care unit. Patient relates this to being on ECMO but I am not really sure that is exactly related to that. I must say I have never seen anything like this. He has fairly extensive full-thickness wounds extending from his heel towards his midfoot mostly centered laterally. There is already been some healing distally. He does not appear to have an arterial issue. He has been using gentamicin to the wound surfaces with Iodoflex to help with ongoing debridement 1/6; this is a patient with pressure ulcers on the bottom of his feet related to excessive pressure from a standing position in the intensive care unit. He is complaining of a lot of pain in the right heel. He is not a diabetic. 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 We have been using Iodoflex to help prepare the surfaces of both wounds for an advanced treatment product. He is nonambulatory spending most of his time in a wheelchair I have asked him not to propel the wheelchair with his heels 1/13; in general his wounds look better not much surface area change we have  not appear to result in any tissue breakdown. There is a little bit of slough on the surface and minimal eschar around the edges. 03/15/2023: The wound measured narrower today. There is some slough accumulation on the surface and some eschar around the edges. 03/23/2023: The length of the wound remains stable, but it continues to narrow. The skin that has covered the posterior aspect of the calcaneus is more robust and has better tensile integrity. Minimal slough and eschar accumulation. 03/30/2023: The wound length has decreased. This seems to be primarily due to more epithelium covering the posterior aspect of the site. Minimal slough and eschar buildup. 04/07/2023: The wound measured a little bit smaller again today. There is minimal slough accumulation with just a little bit of eschar and callus around the edges. 04/13/2023: The lateral aspect of his wound is starting to epithelialize. There is very minimal slough on  the surface. Moisture control is good. 04/20/2023: Continued, albeit extremely slow, encroachment of epithelium as of the wound surface. Minimal slough. Moisture balance is good. 04/27/2023: More epithelium is filling in from the heel area. There is a little bit of slough on the surface with some periwound callus and eschar. He did note a little bit of drainage coming from his right foot and there is a small crack in the skin extending medially from the main site of his prior ulceration. 05/04/2023: The wounds are stable this week. No significant change. There is a little bit of callus buildup around the edges of each site and some thin slough on the surface on the left foot. Electronic Signature(s) Signed: 05/04/2023 12:28:54 PM By: [name] MD FACS Entered By: [name] on 05/04/2023 09:81:19 -------------------------------------------------------------------------------- Physical Exam Details Patient Name: Date of Service: [name], TO [name] E. 05/04/2023 11:15 A M Medical Record Number: [id number] Patient Account Number: [id number] Date of Birth/Sex: Treating RN: 1976-06-26 (47 y.o. M) Primary Care Provider: Dorinda Hill Other Clinician: Referring Provider: Treating Provider/Extender: Jana Hakim in Treatment: 146 Constitutional . . . . no acute distress. Respiratory Normal work of breathing on room air. Notes 05/04/2023: The wounds are stable this week. No significant change. There is a little bit of callus buildup around the edges of each site and some thin slough on the surface on the left foot. Electronic Signature(s) Signed: 05/04/2023 12:29:27 PM By: [name] MD FACS Entered By: [name] on 05/04/2023 13:08:65 [name] ([id number]) [id number] Page 8 of 19 -------------------------------------------------------------------------------- Physician Orders Details Patient Name: Date of Service: [name] [name] E. 05/04/2023 11:15 A M Medical Record Number: [id number] Patient Account Number: [id number] Date of Birth/Sex: Treating RN: 01-14-1976 (47 y.o. [name] Primary Care Provider: Dorinda Hill Other Clinician: Referring Provider: Treating Provider/Extender: Jana Hakim in Treatment: 146 The following information was scribed by: Zenaida Deed The information was scribed for: [name] Verbal / Phone Orders: No Diagnosis Coding ICD-10 Coding Code Description [phone] Non-pressure chronic ulcer of other part of right foot with fat layer exposed L97.522 Non-pressure chronic ulcer of other part of left foot with fat layer exposed Follow-up Appointments ppointment in 1 week. - Dr. Lady Gary Room 3 - Return A Anesthetic Wound #2 Left Calcaneus (In clinic) Topical Lidocaine 4% applied to wound bed - In clinic Bathing/ Shower/ Hygiene May shower and wash wound with soap and water. Edema Control - Lymphedema / SCD / Other Bilateral Lower Extremities Avoid standing for long periods of time. Moisturize legs daily. - as needed  not appear to result in any tissue breakdown. There is a little bit of slough on the surface and minimal eschar around the edges. 03/15/2023: The wound measured narrower today. There is some slough accumulation on the surface and some eschar around the edges. 03/23/2023: The length of the wound remains stable, but it continues to narrow. The skin that has covered the posterior aspect of the calcaneus is more robust and has better tensile integrity. Minimal slough and eschar accumulation. 03/30/2023: The wound length has decreased. This seems to be primarily due to more epithelium covering the posterior aspect of the site. Minimal slough and eschar buildup. 04/07/2023: The wound measured a little bit smaller again today. There is minimal slough accumulation with just a little bit of eschar and callus around the edges. 04/13/2023: The lateral aspect of his wound is starting to epithelialize. There is very minimal slough on  the surface. Moisture control is good. 04/20/2023: Continued, albeit extremely slow, encroachment of epithelium as of the wound surface. Minimal slough. Moisture balance is good. 04/27/2023: More epithelium is filling in from the heel area. There is a little bit of slough on the surface with some periwound callus and eschar. He did note a little bit of drainage coming from his right foot and there is a small crack in the skin extending medially from the main site of his prior ulceration. 05/04/2023: The wounds are stable this week. No significant change. There is a little bit of callus buildup around the edges of each site and some thin slough on the surface on the left foot. Electronic Signature(s) Signed: 05/04/2023 12:28:54 PM By: [name] MD FACS Entered By: [name] on 05/04/2023 09:81:19 -------------------------------------------------------------------------------- Physical Exam Details Patient Name: Date of Service: [name], TO [name] E. 05/04/2023 11:15 A M Medical Record Number: [id number] Patient Account Number: [id number] Date of Birth/Sex: Treating RN: 1976-06-26 (47 y.o. M) Primary Care Provider: Dorinda Hill Other Clinician: Referring Provider: Treating Provider/Extender: Jana Hakim in Treatment: 146 Constitutional . . . . no acute distress. Respiratory Normal work of breathing on room air. Notes 05/04/2023: The wounds are stable this week. No significant change. There is a little bit of callus buildup around the edges of each site and some thin slough on the surface on the left foot. Electronic Signature(s) Signed: 05/04/2023 12:29:27 PM By: [name] MD FACS Entered By: [name] on 05/04/2023 13:08:65 [name] ([id number]) [id number] Page 8 of 19 -------------------------------------------------------------------------------- Physician Orders Details Patient Name: Date of Service: [name] [name] E. 05/04/2023 11:15 A M Medical Record Number: [id number] Patient Account Number: [id number] Date of Birth/Sex: Treating RN: 01-14-1976 (47 y.o. [name] Primary Care Provider: Dorinda Hill Other Clinician: Referring Provider: Treating Provider/Extender: Jana Hakim in Treatment: 146 The following information was scribed by: Zenaida Deed The information was scribed for: [name] Verbal / Phone Orders: No Diagnosis Coding ICD-10 Coding Code Description [phone] Non-pressure chronic ulcer of other part of right foot with fat layer exposed L97.522 Non-pressure chronic ulcer of other part of left foot with fat layer exposed Follow-up Appointments ppointment in 1 week. - Dr. Lady Gary Room 3 - Return A Anesthetic Wound #2 Left Calcaneus (In clinic) Topical Lidocaine 4% applied to wound bed - In clinic Bathing/ Shower/ Hygiene May shower and wash wound with soap and water. Edema Control - Lymphedema / SCD / Other Bilateral Lower Extremities Avoid standing for long periods of time. Moisturize legs daily. - as needed  in/yd) 1 x Per Day/30 Days Discharge Instructions: Secure with Kerlix as directed. 05/04/2023: The wounds are stable this week. No significant change. There is a little bit of callus buildup around the edges of each site and some thin slough on the surface on the left foot. I used a curette to debride callus, eschar, and slough from his wounds. We will continue topical gentamicin and silver alginate to both. The heel off loader shoe is not really providing enough offloading for the left foot so we will put him back in a peg assist. Follow-up in 1 week. Electronic Signature(s) Signed: 05/04/2023 12:32:34 PM By: [name] MD [name], TONY9/26/2024 12:32:34 PM By: [name] MD FACS SignedBea [name] ([id number]) [id number] Page 17 of 19 Entered By: [name] on 05/04/2023 09:32:34 -------------------------------------------------------------------------------- HxROS Details Patient Name: Date of Service: [name] TO [name] E. 05/04/2023 11:15 A M Medical Record Number: [id number] Patient Account Number: [id number] Date of Birth/Sex: Treating RN: 10/09/1975 (47 y.o. M) Primary Care Provider: Dorinda Hill Other Clinician: Referring Provider: Treating Provider/Extender: Jana Hakim in Treatment: 146 Information Obtained From Patient Constitutional Symptoms (General Health) Medical History: Past Medical History Notes: COVID PNA 07/22/2019-11/14/2019 [facility] ECMO,  foot drop left foot , Eyes Medical History: Negative for: Cataracts; Glaucoma; Optic Neuritis Ear/Nose/Mouth/Throat Medical History: Negative for: Chronic sinus problems/congestion; Middle ear problems Hematologic/Lymphatic Medical History: Negative for: Anemia; Hemophilia; Human Immunodeficiency Virus; Lymphedema; Sickle Cell Disease Respiratory Medical History: Positive for: Asthma Negative for: Aspiration; Chronic Obstructive Pulmonary Disease (COPD); Pneumothorax; Sleep Apnea; Tuberculosis Cardiovascular Medical History: Positive for: Angina - with COVID; Hypertension Negative for: Arrhythmia; Congestive Heart Failure; Coronary Artery Disease; Deep Vein Thrombosis; Hypotension; Myocardial Infarction; Peripheral Arterial Disease; Peripheral Venous Disease; Phlebitis; Vasculitis Gastrointestinal Medical History: Negative for: Cirrhosis ; Colitis; Crohns; Hepatitis A; Hepatitis B; Hepatitis C Endocrine Medical History: Negative for: Type I Diabetes; Type II Diabetes Genitourinary Medical History: Negative for: End Stage Renal Disease Past Medical History Notes: kidney stone Immunological Medical History: Negative for: Lupus Erythematosus; Raynauds; Scleroderma DONTARIOUS, BRANOM ([id number]) [id number] Page 18 of 19 Integumentary (Skin) Medical History: Negative for: History of Burn Musculoskeletal Medical History: Negative for: Gout; Rheumatoid Arthritis; Osteoarthritis; Osteomyelitis Neurologic Medical History: Negative for: Dementia; Neuropathy; Quadriplegia; Paraplegia; Seizure Disorder Oncologic Medical History: Negative for: Received Chemotherapy; Received Radiation Psychiatric Medical History: Negative for: Anorexia/bulimia; Confinement Anxiety Past Medical History Notes: anxiety Immunizations Pneumococcal Vaccine: Received Pneumococcal Vaccination: No Implantable Devices None Hospitalization / Surgery History Type of  Hospitalization/Surgery COVID PNA 07/22/2019- 11/14/2019 03/27/2020 wound debridement/ skin graft Family and Social History Cancer: Yes - Maternal Grandparents; Diabetes: Yes - Father,Paternal Grandparents; Heart Disease: Yes - Maternal Grandparents; Hereditary Spherocytosis: No; Hypertension: Yes - Father,Paternal Grandparents; Kidney Disease: No; Lung Disease: Yes - Siblings; Seizures: No; Stroke: No; Thyroid Problems: No; Tuberculosis: No; Never smoker; Marital Status - Married; Alcohol Use: Never; Drug Use: No History; Caffeine Use: Daily - tea, soda; Financial Concerns: No; Food, Clothing or Shelter Needs: No; Support System Lacking: No; Transportation Concerns: No Electronic Signature(s) Signed: 05/04/2023 12:58:46 PM By: [name] MD FACS Entered By: [name] on 05/04/2023 09:29:01 -------------------------------------------------------------------------------- SuperBill Details Patient Name: Date of Service: [name], TO [name] E. 05/04/2023 Medical Record Number: [id number] Patient Account Number: [id number] Date of Birth/Sex: Treating RN: 11/07/1975 (47 y.o. M) Primary Care Provider: Dorinda Hill Other Clinician: Referring Provider: Treating Provider/Extender: Jana Hakim in Treatment: 146 Diagnosis Coding ICD-10 Codes Code Description [phone] Non-pressure chronic ulcer of other part of right foot  in/yd) 1 x Per Day/30 Days Discharge Instructions: Secure with Kerlix as directed. 05/04/2023: The wounds are stable this week. No significant change. There is a little bit of callus buildup around the edges of each site and some thin slough on the surface on the left foot. I used a curette to debride callus, eschar, and slough from his wounds. We will continue topical gentamicin and silver alginate to both. The heel off loader shoe is not really providing enough offloading for the left foot so we will put him back in a peg assist. Follow-up in 1 week. Electronic Signature(s) Signed: 05/04/2023 12:32:34 PM By: [name] MD [name], TONY9/26/2024 12:32:34 PM By: [name] MD FACS SignedBea [name] ([id number]) [id number] Page 17 of 19 Entered By: [name] on 05/04/2023 09:32:34 -------------------------------------------------------------------------------- HxROS Details Patient Name: Date of Service: [name] TO [name] E. 05/04/2023 11:15 A M Medical Record Number: [id number] Patient Account Number: [id number] Date of Birth/Sex: Treating RN: 10/09/1975 (47 y.o. M) Primary Care Provider: Dorinda Hill Other Clinician: Referring Provider: Treating Provider/Extender: Jana Hakim in Treatment: 146 Information Obtained From Patient Constitutional Symptoms (General Health) Medical History: Past Medical History Notes: COVID PNA 07/22/2019-11/14/2019 [facility] ECMO,  foot drop left foot , Eyes Medical History: Negative for: Cataracts; Glaucoma; Optic Neuritis Ear/Nose/Mouth/Throat Medical History: Negative for: Chronic sinus problems/congestion; Middle ear problems Hematologic/Lymphatic Medical History: Negative for: Anemia; Hemophilia; Human Immunodeficiency Virus; Lymphedema; Sickle Cell Disease Respiratory Medical History: Positive for: Asthma Negative for: Aspiration; Chronic Obstructive Pulmonary Disease (COPD); Pneumothorax; Sleep Apnea; Tuberculosis Cardiovascular Medical History: Positive for: Angina - with COVID; Hypertension Negative for: Arrhythmia; Congestive Heart Failure; Coronary Artery Disease; Deep Vein Thrombosis; Hypotension; Myocardial Infarction; Peripheral Arterial Disease; Peripheral Venous Disease; Phlebitis; Vasculitis Gastrointestinal Medical History: Negative for: Cirrhosis ; Colitis; Crohns; Hepatitis A; Hepatitis B; Hepatitis C Endocrine Medical History: Negative for: Type I Diabetes; Type II Diabetes Genitourinary Medical History: Negative for: End Stage Renal Disease Past Medical History Notes: kidney stone Immunological Medical History: Negative for: Lupus Erythematosus; Raynauds; Scleroderma DONTARIOUS, BRANOM ([id number]) [id number] Page 18 of 19 Integumentary (Skin) Medical History: Negative for: History of Burn Musculoskeletal Medical History: Negative for: Gout; Rheumatoid Arthritis; Osteoarthritis; Osteomyelitis Neurologic Medical History: Negative for: Dementia; Neuropathy; Quadriplegia; Paraplegia; Seizure Disorder Oncologic Medical History: Negative for: Received Chemotherapy; Received Radiation Psychiatric Medical History: Negative for: Anorexia/bulimia; Confinement Anxiety Past Medical History Notes: anxiety Immunizations Pneumococcal Vaccine: Received Pneumococcal Vaccination: No Implantable Devices None Hospitalization / Surgery History Type of  Hospitalization/Surgery COVID PNA 07/22/2019- 11/14/2019 03/27/2020 wound debridement/ skin graft Family and Social History Cancer: Yes - Maternal Grandparents; Diabetes: Yes - Father,Paternal Grandparents; Heart Disease: Yes - Maternal Grandparents; Hereditary Spherocytosis: No; Hypertension: Yes - Father,Paternal Grandparents; Kidney Disease: No; Lung Disease: Yes - Siblings; Seizures: No; Stroke: No; Thyroid Problems: No; Tuberculosis: No; Never smoker; Marital Status - Married; Alcohol Use: Never; Drug Use: No History; Caffeine Use: Daily - tea, soda; Financial Concerns: No; Food, Clothing or Shelter Needs: No; Support System Lacking: No; Transportation Concerns: No Electronic Signature(s) Signed: 05/04/2023 12:58:46 PM By: [name] MD FACS Entered By: [name] on 05/04/2023 09:29:01 -------------------------------------------------------------------------------- SuperBill Details Patient Name: Date of Service: [name], TO [name] E. 05/04/2023 Medical Record Number: [id number] Patient Account Number: [id number] Date of Birth/Sex: Treating RN: 11/07/1975 (47 y.o. M) Primary Care Provider: Dorinda Hill Other Clinician: Referring Provider: Treating Provider/Extender: Jana Hakim in Treatment: 146 Diagnosis Coding ICD-10 Codes Code Description [phone] Non-pressure chronic ulcer of other part of right foot  19 Electronic Signature(s) Signed: 05/04/2023 12:27:44 PM By: [name] MD FACS Entered By: [name] on 05/04/2023 09:27:44 -------------------------------------------------------------------------------- Progress Note Details Patient Name: Date of Service: [name], TO [name] E. 05/04/2023 11:15 A M Medical Record Number: [id number] Patient Account Number:  [id number] Date of Birth/Sex: Treating RN: 1975-09-09 (47 y.o. M) Primary Care Provider: Dorinda Hill Other Clinician: Referring Provider: Treating Provider/Extender: Jana Hakim in Treatment: 146 Subjective Chief Complaint Information obtained from Patient Bilateral Plantar Foot Ulcers History of Present Illness (HPI) Wounds are12/03/2020 upon evaluation today patient presents for initial inspection here in our clinic concerning issues he has been having with the bottoms of his feet bilaterally. He states these actually occurred as wounds when he was hospitalized for 5 months secondary to Covid. He was apparently with tilting bed where he was in an upright position quite frequently and apparently this occurred in some way shape or form during that time. Fortunately there is no sign of active infection at this time. No fevers, chills, nausea, vomiting, or diarrhea. With that being said he still has substantial wounds on the plantar aspects of his feet Theragen require quite a bit of work to get these to heal. He has been using Santyl currently though that is been problematic both in receiving the medication as well as actually paid for it as it is become quite expensive. Prior to the experience with Covid the patient really did not have any major medical problems other than hypertension he does have some mild generalized weakness following the Covid experience. 07/22/2020 on evaluation today patient appears to be doing okay in regard to his foot ulcers I feel like the wound beds are showing signs of better improvement that I do believe the Iodoflex is helping in this regard. With that being said he does have a lot of drainage currently and this is somewhat blue/green in nature which is consistent with Pseudomonas. I do think a culture today would be appropriate for [place] to evaluate and see if that is indeed the case I would likely start him on antibiotic orally as well he is  not allergic to Cipro knows of no issues he has had in the past 12/21; patient was admitted to the clinic earlier this month with bilateral presumed pressure ulcers on the bottom of his feet apparently related to excessive pressure from a tilt table arrangement in the intensive care unit. Patient relates this to being on ECMO but I am not really sure that is exactly related to that. I must say I have never seen anything like this. He has fairly extensive full-thickness wounds extending from his heel towards his midfoot mostly centered laterally. There is already been some healing distally. He does not appear to have an arterial issue. He has been using gentamicin to the wound surfaces with Iodoflex to help with ongoing debridement 1/6; this is a patient with pressure ulcers on the bottom of his feet related to excessive pressure from a standing position in the intensive care unit. He is complaining of a lot of pain in the right heel. He is not a diabetic. 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 We have been using Iodoflex to help prepare the surfaces of both wounds for an advanced treatment product. He is nonambulatory spending most of his time in a wheelchair I have asked him not to propel the wheelchair with his heels 1/13; in general his wounds look better not much surface area change we have  in/yd) 1 x Per Day/30 Days Discharge Instructions: Secure with Kerlix as directed. 05/04/2023: The wounds are stable this week. No significant change. There is a little bit of callus buildup around the edges of each site and some thin slough on the surface on the left foot. I used a curette to debride callus, eschar, and slough from his wounds. We will continue topical gentamicin and silver alginate to both. The heel off loader shoe is not really providing enough offloading for the left foot so we will put him back in a peg assist. Follow-up in 1 week. Electronic Signature(s) Signed: 05/04/2023 12:32:34 PM By: [name] MD [name], TONY9/26/2024 12:32:34 PM By: [name] MD FACS SignedBea [name] ([id number]) [id number] Page 17 of 19 Entered By: [name] on 05/04/2023 09:32:34 -------------------------------------------------------------------------------- HxROS Details Patient Name: Date of Service: [name] TO [name] E. 05/04/2023 11:15 A M Medical Record Number: [id number] Patient Account Number: [id number] Date of Birth/Sex: Treating RN: 10/09/1975 (47 y.o. M) Primary Care Provider: Dorinda Hill Other Clinician: Referring Provider: Treating Provider/Extender: Jana Hakim in Treatment: 146 Information Obtained From Patient Constitutional Symptoms (General Health) Medical History: Past Medical History Notes: COVID PNA 07/22/2019-11/14/2019 [facility] ECMO,  foot drop left foot , Eyes Medical History: Negative for: Cataracts; Glaucoma; Optic Neuritis Ear/Nose/Mouth/Throat Medical History: Negative for: Chronic sinus problems/congestion; Middle ear problems Hematologic/Lymphatic Medical History: Negative for: Anemia; Hemophilia; Human Immunodeficiency Virus; Lymphedema; Sickle Cell Disease Respiratory Medical History: Positive for: Asthma Negative for: Aspiration; Chronic Obstructive Pulmonary Disease (COPD); Pneumothorax; Sleep Apnea; Tuberculosis Cardiovascular Medical History: Positive for: Angina - with COVID; Hypertension Negative for: Arrhythmia; Congestive Heart Failure; Coronary Artery Disease; Deep Vein Thrombosis; Hypotension; Myocardial Infarction; Peripheral Arterial Disease; Peripheral Venous Disease; Phlebitis; Vasculitis Gastrointestinal Medical History: Negative for: Cirrhosis ; Colitis; Crohns; Hepatitis A; Hepatitis B; Hepatitis C Endocrine Medical History: Negative for: Type I Diabetes; Type II Diabetes Genitourinary Medical History: Negative for: End Stage Renal Disease Past Medical History Notes: kidney stone Immunological Medical History: Negative for: Lupus Erythematosus; Raynauds; Scleroderma DONTARIOUS, BRANOM ([id number]) [id number] Page 18 of 19 Integumentary (Skin) Medical History: Negative for: History of Burn Musculoskeletal Medical History: Negative for: Gout; Rheumatoid Arthritis; Osteoarthritis; Osteomyelitis Neurologic Medical History: Negative for: Dementia; Neuropathy; Quadriplegia; Paraplegia; Seizure Disorder Oncologic Medical History: Negative for: Received Chemotherapy; Received Radiation Psychiatric Medical History: Negative for: Anorexia/bulimia; Confinement Anxiety Past Medical History Notes: anxiety Immunizations Pneumococcal Vaccine: Received Pneumococcal Vaccination: No Implantable Devices None Hospitalization / Surgery History Type of  Hospitalization/Surgery COVID PNA 07/22/2019- 11/14/2019 03/27/2020 wound debridement/ skin graft Family and Social History Cancer: Yes - Maternal Grandparents; Diabetes: Yes - Father,Paternal Grandparents; Heart Disease: Yes - Maternal Grandparents; Hereditary Spherocytosis: No; Hypertension: Yes - Father,Paternal Grandparents; Kidney Disease: No; Lung Disease: Yes - Siblings; Seizures: No; Stroke: No; Thyroid Problems: No; Tuberculosis: No; Never smoker; Marital Status - Married; Alcohol Use: Never; Drug Use: No History; Caffeine Use: Daily - tea, soda; Financial Concerns: No; Food, Clothing or Shelter Needs: No; Support System Lacking: No; Transportation Concerns: No Electronic Signature(s) Signed: 05/04/2023 12:58:46 PM By: [name] MD FACS Entered By: [name] on 05/04/2023 09:29:01 -------------------------------------------------------------------------------- SuperBill Details Patient Name: Date of Service: [name], TO [name] E. 05/04/2023 Medical Record Number: [id number] Patient Account Number: [id number] Date of Birth/Sex: Treating RN: 11/07/1975 (47 y.o. M) Primary Care Provider: Dorinda Hill Other Clinician: Referring Provider: Treating Provider/Extender: Jana Hakim in Treatment: 146 Diagnosis Coding ICD-10 Codes Code Description [phone] Non-pressure chronic ulcer of other part of right foot  19 Electronic Signature(s) Signed: 05/04/2023 12:27:44 PM By: [name] MD FACS Entered By: [name] on 05/04/2023 09:27:44 -------------------------------------------------------------------------------- Progress Note Details Patient Name: Date of Service: [name], TO [name] E. 05/04/2023 11:15 A M Medical Record Number: [id number] Patient Account Number:  [id number] Date of Birth/Sex: Treating RN: 1975-09-09 (47 y.o. M) Primary Care Provider: Dorinda Hill Other Clinician: Referring Provider: Treating Provider/Extender: Jana Hakim in Treatment: 146 Subjective Chief Complaint Information obtained from Patient Bilateral Plantar Foot Ulcers History of Present Illness (HPI) Wounds are12/03/2020 upon evaluation today patient presents for initial inspection here in our clinic concerning issues he has been having with the bottoms of his feet bilaterally. He states these actually occurred as wounds when he was hospitalized for 5 months secondary to Covid. He was apparently with tilting bed where he was in an upright position quite frequently and apparently this occurred in some way shape or form during that time. Fortunately there is no sign of active infection at this time. No fevers, chills, nausea, vomiting, or diarrhea. With that being said he still has substantial wounds on the plantar aspects of his feet Theragen require quite a bit of work to get these to heal. He has been using Santyl currently though that is been problematic both in receiving the medication as well as actually paid for it as it is become quite expensive. Prior to the experience with Covid the patient really did not have any major medical problems other than hypertension he does have some mild generalized weakness following the Covid experience. 07/22/2020 on evaluation today patient appears to be doing okay in regard to his foot ulcers I feel like the wound beds are showing signs of better improvement that I do believe the Iodoflex is helping in this regard. With that being said he does have a lot of drainage currently and this is somewhat blue/green in nature which is consistent with Pseudomonas. I do think a culture today would be appropriate for [place] to evaluate and see if that is indeed the case I would likely start him on antibiotic orally as well he is  not allergic to Cipro knows of no issues he has had in the past 12/21; patient was admitted to the clinic earlier this month with bilateral presumed pressure ulcers on the bottom of his feet apparently related to excessive pressure from a tilt table arrangement in the intensive care unit. Patient relates this to being on ECMO but I am not really sure that is exactly related to that. I must say I have never seen anything like this. He has fairly extensive full-thickness wounds extending from his heel towards his midfoot mostly centered laterally. There is already been some healing distally. He does not appear to have an arterial issue. He has been using gentamicin to the wound surfaces with Iodoflex to help with ongoing debridement 1/6; this is a patient with pressure ulcers on the bottom of his feet related to excessive pressure from a standing position in the intensive care unit. He is complaining of a lot of pain in the right heel. He is not a diabetic. 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 We have been using Iodoflex to help prepare the surfaces of both wounds for an advanced treatment product. He is nonambulatory spending most of his time in a wheelchair I have asked him not to propel the wheelchair with his heels 1/13; in general his wounds look better not much surface area change we have  not appear to result in any tissue breakdown. There is a little bit of slough on the surface and minimal eschar around the edges. 03/15/2023: The wound measured narrower today. There is some slough accumulation on the surface and some eschar around the edges. 03/23/2023: The length of the wound remains stable, but it continues to narrow. The skin that has covered the posterior aspect of the calcaneus is more robust and has better tensile integrity. Minimal slough and eschar accumulation. 03/30/2023: The wound length has decreased. This seems to be primarily due to more epithelium covering the posterior aspect of the site. Minimal slough and eschar buildup. 04/07/2023: The wound measured a little bit smaller again today. There is minimal slough accumulation with just a little bit of eschar and callus around the edges. 04/13/2023: The lateral aspect of his wound is starting to epithelialize. There is very minimal slough on  the surface. Moisture control is good. 04/20/2023: Continued, albeit extremely slow, encroachment of epithelium as of the wound surface. Minimal slough. Moisture balance is good. 04/27/2023: More epithelium is filling in from the heel area. There is a little bit of slough on the surface with some periwound callus and eschar. He did note a little bit of drainage coming from his right foot and there is a small crack in the skin extending medially from the main site of his prior ulceration. 05/04/2023: The wounds are stable this week. No significant change. There is a little bit of callus buildup around the edges of each site and some thin slough on the surface on the left foot. Electronic Signature(s) Signed: 05/04/2023 12:28:54 PM By: [name] MD FACS Entered By: [name] on 05/04/2023 09:81:19 -------------------------------------------------------------------------------- Physical Exam Details Patient Name: Date of Service: [name], TO [name] E. 05/04/2023 11:15 A M Medical Record Number: [id number] Patient Account Number: [id number] Date of Birth/Sex: Treating RN: 1976-06-26 (47 y.o. M) Primary Care Provider: Dorinda Hill Other Clinician: Referring Provider: Treating Provider/Extender: Jana Hakim in Treatment: 146 Constitutional . . . . no acute distress. Respiratory Normal work of breathing on room air. Notes 05/04/2023: The wounds are stable this week. No significant change. There is a little bit of callus buildup around the edges of each site and some thin slough on the surface on the left foot. Electronic Signature(s) Signed: 05/04/2023 12:29:27 PM By: [name] MD FACS Entered By: [name] on 05/04/2023 13:08:65 [name] ([id number]) [id number] Page 8 of 19 -------------------------------------------------------------------------------- Physician Orders Details Patient Name: Date of Service: [name] [name] E. 05/04/2023 11:15 A M Medical Record Number: [id number] Patient Account Number: [id number] Date of Birth/Sex: Treating RN: 01-14-1976 (47 y.o. [name] Primary Care Provider: Dorinda Hill Other Clinician: Referring Provider: Treating Provider/Extender: Jana Hakim in Treatment: 146 The following information was scribed by: Zenaida Deed The information was scribed for: [name] Verbal / Phone Orders: No Diagnosis Coding ICD-10 Coding Code Description [phone] Non-pressure chronic ulcer of other part of right foot with fat layer exposed L97.522 Non-pressure chronic ulcer of other part of left foot with fat layer exposed Follow-up Appointments ppointment in 1 week. - Dr. Lady Gary Room 3 - Return A Anesthetic Wound #2 Left Calcaneus (In clinic) Topical Lidocaine 4% applied to wound bed - In clinic Bathing/ Shower/ Hygiene May shower and wash wound with soap and water. Edema Control - Lymphedema / SCD / Other Bilateral Lower Extremities Avoid standing for long periods of time. Moisturize legs daily. - as needed  not appear to result in any tissue breakdown. There is a little bit of slough on the surface and minimal eschar around the edges. 03/15/2023: The wound measured narrower today. There is some slough accumulation on the surface and some eschar around the edges. 03/23/2023: The length of the wound remains stable, but it continues to narrow. The skin that has covered the posterior aspect of the calcaneus is more robust and has better tensile integrity. Minimal slough and eschar accumulation. 03/30/2023: The wound length has decreased. This seems to be primarily due to more epithelium covering the posterior aspect of the site. Minimal slough and eschar buildup. 04/07/2023: The wound measured a little bit smaller again today. There is minimal slough accumulation with just a little bit of eschar and callus around the edges. 04/13/2023: The lateral aspect of his wound is starting to epithelialize. There is very minimal slough on  the surface. Moisture control is good. 04/20/2023: Continued, albeit extremely slow, encroachment of epithelium as of the wound surface. Minimal slough. Moisture balance is good. 04/27/2023: More epithelium is filling in from the heel area. There is a little bit of slough on the surface with some periwound callus and eschar. He did note a little bit of drainage coming from his right foot and there is a small crack in the skin extending medially from the main site of his prior ulceration. 05/04/2023: The wounds are stable this week. No significant change. There is a little bit of callus buildup around the edges of each site and some thin slough on the surface on the left foot. Electronic Signature(s) Signed: 05/04/2023 12:28:54 PM By: [name] MD FACS Entered By: [name] on 05/04/2023 09:81:19 -------------------------------------------------------------------------------- Physical Exam Details Patient Name: Date of Service: [name], TO [name] E. 05/04/2023 11:15 A M Medical Record Number: [id number] Patient Account Number: [id number] Date of Birth/Sex: Treating RN: 1976-06-26 (47 y.o. M) Primary Care Provider: Dorinda Hill Other Clinician: Referring Provider: Treating Provider/Extender: Jana Hakim in Treatment: 146 Constitutional . . . . no acute distress. Respiratory Normal work of breathing on room air. Notes 05/04/2023: The wounds are stable this week. No significant change. There is a little bit of callus buildup around the edges of each site and some thin slough on the surface on the left foot. Electronic Signature(s) Signed: 05/04/2023 12:29:27 PM By: [name] MD FACS Entered By: [name] on 05/04/2023 13:08:65 [name] ([id number]) [id number] Page 8 of 19 -------------------------------------------------------------------------------- Physician Orders Details Patient Name: Date of Service: [name] [name] E. 05/04/2023 11:15 A M Medical Record Number: [id number] Patient Account Number: [id number] Date of Birth/Sex: Treating RN: 01-14-1976 (47 y.o. [name] Primary Care Provider: Dorinda Hill Other Clinician: Referring Provider: Treating Provider/Extender: Jana Hakim in Treatment: 146 The following information was scribed by: Zenaida Deed The information was scribed for: [name] Verbal / Phone Orders: No Diagnosis Coding ICD-10 Coding Code Description [phone] Non-pressure chronic ulcer of other part of right foot with fat layer exposed L97.522 Non-pressure chronic ulcer of other part of left foot with fat layer exposed Follow-up Appointments ppointment in 1 week. - Dr. Lady Gary Room 3 - Return A Anesthetic Wound #2 Left Calcaneus (In clinic) Topical Lidocaine 4% applied to wound bed - In clinic Bathing/ Shower/ Hygiene May shower and wash wound with soap and water. Edema Control - Lymphedema / SCD / Other Bilateral Lower Extremities Avoid standing for long periods of time. Moisturize legs daily. - as needed  not appear to result in any tissue breakdown. There is a little bit of slough on the surface and minimal eschar around the edges. 03/15/2023: The wound measured narrower today. There is some slough accumulation on the surface and some eschar around the edges. 03/23/2023: The length of the wound remains stable, but it continues to narrow. The skin that has covered the posterior aspect of the calcaneus is more robust and has better tensile integrity. Minimal slough and eschar accumulation. 03/30/2023: The wound length has decreased. This seems to be primarily due to more epithelium covering the posterior aspect of the site. Minimal slough and eschar buildup. 04/07/2023: The wound measured a little bit smaller again today. There is minimal slough accumulation with just a little bit of eschar and callus around the edges. 04/13/2023: The lateral aspect of his wound is starting to epithelialize. There is very minimal slough on  the surface. Moisture control is good. 04/20/2023: Continued, albeit extremely slow, encroachment of epithelium as of the wound surface. Minimal slough. Moisture balance is good. 04/27/2023: More epithelium is filling in from the heel area. There is a little bit of slough on the surface with some periwound callus and eschar. He did note a little bit of drainage coming from his right foot and there is a small crack in the skin extending medially from the main site of his prior ulceration. 05/04/2023: The wounds are stable this week. No significant change. There is a little bit of callus buildup around the edges of each site and some thin slough on the surface on the left foot. Electronic Signature(s) Signed: 05/04/2023 12:28:54 PM By: [name] MD FACS Entered By: [name] on 05/04/2023 09:81:19 -------------------------------------------------------------------------------- Physical Exam Details Patient Name: Date of Service: [name], TO [name] E. 05/04/2023 11:15 A M Medical Record Number: [id number] Patient Account Number: [id number] Date of Birth/Sex: Treating RN: 1976-06-26 (47 y.o. M) Primary Care Provider: Dorinda Hill Other Clinician: Referring Provider: Treating Provider/Extender: Jana Hakim in Treatment: 146 Constitutional . . . . no acute distress. Respiratory Normal work of breathing on room air. Notes 05/04/2023: The wounds are stable this week. No significant change. There is a little bit of callus buildup around the edges of each site and some thin slough on the surface on the left foot. Electronic Signature(s) Signed: 05/04/2023 12:29:27 PM By: [name] MD FACS Entered By: [name] on 05/04/2023 13:08:65 [name] ([id number]) [id number] Page 8 of 19 -------------------------------------------------------------------------------- Physician Orders Details Patient Name: Date of Service: [name] [name] E. 05/04/2023 11:15 A M Medical Record Number: [id number] Patient Account Number: [id number] Date of Birth/Sex: Treating RN: 01-14-1976 (47 y.o. [name] Primary Care Provider: Dorinda Hill Other Clinician: Referring Provider: Treating Provider/Extender: Jana Hakim in Treatment: 146 The following information was scribed by: Zenaida Deed The information was scribed for: [name] Verbal / Phone Orders: No Diagnosis Coding ICD-10 Coding Code Description [phone] Non-pressure chronic ulcer of other part of right foot with fat layer exposed L97.522 Non-pressure chronic ulcer of other part of left foot with fat layer exposed Follow-up Appointments ppointment in 1 week. - Dr. Lady Gary Room 3 - Return A Anesthetic Wound #2 Left Calcaneus (In clinic) Topical Lidocaine 4% applied to wound bed - In clinic Bathing/ Shower/ Hygiene May shower and wash wound with soap and water. Edema Control - Lymphedema / SCD / Other Bilateral Lower Extremities Avoid standing for long periods of time. Moisturize legs daily. - as needed  in/yd) 1 x Per Day/30 Days Discharge Instructions: Secure with Kerlix as directed. 05/04/2023: The wounds are stable this week. No significant change. There is a little bit of callus buildup around the edges of each site and some thin slough on the surface on the left foot. I used a curette to debride callus, eschar, and slough from his wounds. We will continue topical gentamicin and silver alginate to both. The heel off loader shoe is not really providing enough offloading for the left foot so we will put him back in a peg assist. Follow-up in 1 week. Electronic Signature(s) Signed: 05/04/2023 12:32:34 PM By: [name] MD [name], TONY9/26/2024 12:32:34 PM By: [name] MD FACS SignedBea [name] ([id number]) [id number] Page 17 of 19 Entered By: [name] on 05/04/2023 09:32:34 -------------------------------------------------------------------------------- HxROS Details Patient Name: Date of Service: [name] TO [name] E. 05/04/2023 11:15 A M Medical Record Number: [id number] Patient Account Number: [id number] Date of Birth/Sex: Treating RN: 10/09/1975 (47 y.o. M) Primary Care Provider: Dorinda Hill Other Clinician: Referring Provider: Treating Provider/Extender: Jana Hakim in Treatment: 146 Information Obtained From Patient Constitutional Symptoms (General Health) Medical History: Past Medical History Notes: COVID PNA 07/22/2019-11/14/2019 VENT ECMO,  foot drop left foot , Eyes Medical History: Negative for: Cataracts; Glaucoma; Optic Neuritis Ear/Nose/Mouth/Throat Medical History: Negative for: Chronic sinus problems/congestion; Middle ear problems Hematologic/Lymphatic Medical History: Negative for: Anemia; Hemophilia; Human Immunodeficiency Virus; Lymphedema; Sickle Cell Disease Respiratory Medical History: Positive for: Asthma Negative for: Aspiration; Chronic Obstructive Pulmonary Disease (COPD); Pneumothorax; Sleep Apnea; Tuberculosis Cardiovascular Medical History: Positive for: Angina - with COVID; Hypertension Negative for: Arrhythmia; Congestive Heart Failure; Coronary Artery Disease; Deep Vein Thrombosis; Hypotension; Myocardial Infarction; Peripheral Arterial Disease; Peripheral Venous Disease; Phlebitis; Vasculitis Gastrointestinal Medical History: Negative for: Cirrhosis ; Colitis; Crohns; Hepatitis A; Hepatitis B; Hepatitis C Endocrine Medical History: Negative for: Type I Diabetes; Type II Diabetes Genitourinary Medical History: Negative for: End Stage Renal Disease Past Medical History Notes: kidney stone Immunological Medical History: Negative for: Lupus Erythematosus; Raynauds; Scleroderma DONTARIOUS, BRANOM ([id number]) [id number] Page 18 of 19 Integumentary (Skin) Medical History: Negative for: History of Burn Musculoskeletal Medical History: Negative for: Gout; Rheumatoid Arthritis; Osteoarthritis; Osteomyelitis Neurologic Medical History: Negative for: Dementia; Neuropathy; Quadriplegia; Paraplegia; Seizure Disorder Oncologic Medical History: Negative for: Received Chemotherapy; Received Radiation Psychiatric Medical History: Negative for: Anorexia/bulimia; Confinement Anxiety Past Medical History Notes: anxiety Immunizations Pneumococcal Vaccine: Received Pneumococcal Vaccination: No Implantable Devices None Hospitalization / Surgery History Type of  Hospitalization/Surgery COVID PNA 07/22/2019- 11/14/2019 03/27/2020 wound debridement/ skin graft Family and Social History Cancer: Yes - Maternal Grandparents; Diabetes: Yes - Father,Paternal Grandparents; Heart Disease: Yes - Maternal Grandparents; Hereditary Spherocytosis: No; Hypertension: Yes - Father,Paternal Grandparents; Kidney Disease: No; Lung Disease: Yes - Siblings; Seizures: No; Stroke: No; Thyroid Problems: No; Tuberculosis: No; Never smoker; Marital Status - Married; Alcohol Use: Never; Drug Use: No History; Caffeine Use: Daily - tea, soda; Financial Concerns: No; Food, Clothing or Shelter Needs: No; Support System Lacking: No; Transportation Concerns: No Electronic Signature(s) Signed: 05/04/2023 12:58:46 PM By: [name] MD FACS Entered By: [name] on 05/04/2023 09:29:01 -------------------------------------------------------------------------------- SuperBill Details Patient Name: Date of Service: [name], TO [name] E. 05/04/2023 Medical Record Number: [id number] Patient Account Number: [id number] Date of Birth/Sex: Treating RN: 11/07/1975 (47 y.o. M) Primary Care Provider: Dorinda Hill Other Clinician: Referring Provider: Treating Provider/Extender: Jana Hakim in Treatment: 146 Diagnosis Coding ICD-10 Codes Code Description [phone] Non-pressure chronic ulcer of other part of right foot  19 Electronic Signature(s) Signed: 05/04/2023 12:27:44 PM By: [name] MD FACS Entered By: [name] on 05/04/2023 09:27:44 -------------------------------------------------------------------------------- Progress Note Details Patient Name: Date of Service: [name], TO [name] E. 05/04/2023 11:15 A M Medical Record Number: [id number] Patient Account Number:  [id number] Date of Birth/Sex: Treating RN: 1975-09-09 (47 y.o. M) Primary Care Provider: Dorinda Hill Other Clinician: Referring Provider: Treating Provider/Extender: Jana Hakim in Treatment: 146 Subjective Chief Complaint Information obtained from Patient Bilateral Plantar Foot Ulcers History of Present Illness (HPI) Wounds are12/03/2020 upon evaluation today patient presents for initial inspection here in our clinic concerning issues he has been having with the bottoms of his feet bilaterally. He states these actually occurred as wounds when he was hospitalized for 5 months secondary to Covid. He was apparently with tilting bed where he was in an upright position quite frequently and apparently this occurred in some way shape or form during that time. Fortunately there is no sign of active infection at this time. No fevers, chills, nausea, vomiting, or diarrhea. With that being said he still has substantial wounds on the plantar aspects of his feet Theragen require quite a bit of work to get these to heal. He has been using Santyl currently though that is been problematic both in receiving the medication as well as actually paid for it as it is become quite expensive. Prior to the experience with Covid the patient really did not have any major medical problems other than hypertension he does have some mild generalized weakness following the Covid experience. 07/22/2020 on evaluation today patient appears to be doing okay in regard to his foot ulcers I feel like the wound beds are showing signs of better improvement that I do believe the Iodoflex is helping in this regard. With that being said he does have a lot of drainage currently and this is somewhat blue/green in nature which is consistent with Pseudomonas. I do think a culture today would be appropriate for [place] to evaluate and see if that is indeed the case I would likely start him on antibiotic orally as well he is  not allergic to Cipro knows of no issues he has had in the past 12/21; patient was admitted to the clinic earlier this month with bilateral presumed pressure ulcers on the bottom of his feet apparently related to excessive pressure from a tilt table arrangement in the intensive care unit. Patient relates this to being on ECMO but I am not really sure that is exactly related to that. I must say I have never seen anything like this. He has fairly extensive full-thickness wounds extending from his heel towards his midfoot mostly centered laterally. There is already been some healing distally. He does not appear to have an arterial issue. He has been using gentamicin to the wound surfaces with Iodoflex to help with ongoing debridement 1/6; this is a patient with pressure ulcers on the bottom of his feet related to excessive pressure from a standing position in the intensive care unit. He is complaining of a lot of pain in the right heel. He is not a diabetic. 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 We have been using Iodoflex to help prepare the surfaces of both wounds for an advanced treatment product. He is nonambulatory spending most of his time in a wheelchair I have asked him not to propel the wheelchair with his heels 1/13; in general his wounds look better not much surface area change we have  in/yd) 1 x Per Day/30 Days Discharge Instructions: Secure with Kerlix as directed. 05/04/2023: The wounds are stable this week. No significant change. There is a little bit of callus buildup around the edges of each site and some thin slough on the surface on the left foot. I used a curette to debride callus, eschar, and slough from his wounds. We will continue topical gentamicin and silver alginate to both. The heel off loader shoe is not really providing enough offloading for the left foot so we will put him back in a peg assist. Follow-up in 1 week. Electronic Signature(s) Signed: 05/04/2023 12:32:34 PM By: [name] MD [name], TONY9/26/2024 12:32:34 PM By: [name] MD FACS SignedBea [name] ([id number]) [id number] Page 17 of 19 Entered By: [name] on 05/04/2023 09:32:34 -------------------------------------------------------------------------------- HxROS Details Patient Name: Date of Service: [name] TO [name] E. 05/04/2023 11:15 A M Medical Record Number: [id number] Patient Account Number: [id number] Date of Birth/Sex: Treating RN: 10/09/1975 (47 y.o. M) Primary Care Provider: Dorinda Hill Other Clinician: Referring Provider: Treating Provider/Extender: Jana Hakim in Treatment: 146 Information Obtained From Patient Constitutional Symptoms (General Health) Medical History: Past Medical History Notes: COVID PNA 07/22/2019-11/14/2019 [facility] ECMO,  foot drop left foot , Eyes Medical History: Negative for: Cataracts; Glaucoma; Optic Neuritis Ear/Nose/Mouth/Throat Medical History: Negative for: Chronic sinus problems/congestion; Middle ear problems Hematologic/Lymphatic Medical History: Negative for: Anemia; Hemophilia; Human Immunodeficiency Virus; Lymphedema; Sickle Cell Disease Respiratory Medical History: Positive for: Asthma Negative for: Aspiration; Chronic Obstructive Pulmonary Disease (COPD); Pneumothorax; Sleep Apnea; Tuberculosis Cardiovascular Medical History: Positive for: Angina - with COVID; Hypertension Negative for: Arrhythmia; Congestive Heart Failure; Coronary Artery Disease; Deep Vein Thrombosis; Hypotension; Myocardial Infarction; Peripheral Arterial Disease; Peripheral Venous Disease; Phlebitis; Vasculitis Gastrointestinal Medical History: Negative for: Cirrhosis ; Colitis; Crohns; Hepatitis A; Hepatitis B; Hepatitis C Endocrine Medical History: Negative for: Type I Diabetes; Type II Diabetes Genitourinary Medical History: Negative for: End Stage Renal Disease Past Medical History Notes: kidney stone Immunological Medical History: Negative for: Lupus Erythematosus; Raynauds; Scleroderma DONTARIOUS, BRANOM ([id number]) [id number] Page 18 of 19 Integumentary (Skin) Medical History: Negative for: History of Burn Musculoskeletal Medical History: Negative for: Gout; Rheumatoid Arthritis; Osteoarthritis; Osteomyelitis Neurologic Medical History: Negative for: Dementia; Neuropathy; Quadriplegia; Paraplegia; Seizure Disorder Oncologic Medical History: Negative for: Received Chemotherapy; Received Radiation Psychiatric Medical History: Negative for: Anorexia/bulimia; Confinement Anxiety Past Medical History Notes: anxiety Immunizations Pneumococcal Vaccine: Received Pneumococcal Vaccination: No Implantable Devices None Hospitalization / Surgery History Type of  Hospitalization/Surgery COVID PNA 07/22/2019- 11/14/2019 03/27/2020 wound debridement/ skin graft Family and Social History Cancer: Yes - Maternal Grandparents; Diabetes: Yes - Father,Paternal Grandparents; Heart Disease: Yes - Maternal Grandparents; Hereditary Spherocytosis: No; Hypertension: Yes - Father,Paternal Grandparents; Kidney Disease: No; Lung Disease: Yes - Siblings; Seizures: No; Stroke: No; Thyroid Problems: No; Tuberculosis: No; Never smoker; Marital Status - Married; Alcohol Use: Never; Drug Use: No History; Caffeine Use: Daily - tea, soda; Financial Concerns: No; Food, Clothing or Shelter Needs: No; Support System Lacking: No; Transportation Concerns: No Electronic Signature(s) Signed: 05/04/2023 12:58:46 PM By: [name] MD FACS Entered By: [name] on 05/04/2023 09:29:01 -------------------------------------------------------------------------------- SuperBill Details Patient Name: Date of Service: [name], TO [name] E. 05/04/2023 Medical Record Number: [id number] Patient Account Number: [id number] Date of Birth/Sex: Treating RN: 11/07/1975 (47 y.o. M) Primary Care Provider: Dorinda Hill Other Clinician: Referring Provider: Treating Provider/Extender: Jana Hakim in Treatment: 146 Diagnosis Coding ICD-10 Codes Code Description [phone] Non-pressure chronic ulcer of other part of right foot  in/yd) 1 x Per Day/30 Days Discharge Instructions: Secure with Kerlix as directed. 05/04/2023: The wounds are stable this week. No significant change. There is a little bit of callus buildup around the edges of each site and some thin slough on the surface on the left foot. I used a curette to debride callus, eschar, and slough from his wounds. We will continue topical gentamicin and silver alginate to both. The heel off loader shoe is not really providing enough offloading for the left foot so we will put him back in a peg assist. Follow-up in 1 week. Electronic Signature(s) Signed: 05/04/2023 12:32:34 PM By: [name] MD [name], TONY9/26/2024 12:32:34 PM By: [name] MD FACS SignedBea [name] ([id number]) [id number] Page 17 of 19 Entered By: [name] on 05/04/2023 09:32:34 -------------------------------------------------------------------------------- HxROS Details Patient Name: Date of Service: [name] TO [name] E. 05/04/2023 11:15 A M Medical Record Number: [id number] Patient Account Number: [id number] Date of Birth/Sex: Treating RN: 10/09/1975 (47 y.o. M) Primary Care Provider: Dorinda Hill Other Clinician: Referring Provider: Treating Provider/Extender: Jana Hakim in Treatment: 146 Information Obtained From Patient Constitutional Symptoms (General Health) Medical History: Past Medical History Notes: COVID PNA 07/22/2019-11/14/2019 [facility] ECMO,  foot drop left foot , Eyes Medical History: Negative for: Cataracts; Glaucoma; Optic Neuritis Ear/Nose/Mouth/Throat Medical History: Negative for: Chronic sinus problems/congestion; Middle ear problems Hematologic/Lymphatic Medical History: Negative for: Anemia; Hemophilia; Human Immunodeficiency Virus; Lymphedema; Sickle Cell Disease Respiratory Medical History: Positive for: Asthma Negative for: Aspiration; Chronic Obstructive Pulmonary Disease (COPD); Pneumothorax; Sleep Apnea; Tuberculosis Cardiovascular Medical History: Positive for: Angina - with COVID; Hypertension Negative for: Arrhythmia; Congestive Heart Failure; Coronary Artery Disease; Deep Vein Thrombosis; Hypotension; Myocardial Infarction; Peripheral Arterial Disease; Peripheral Venous Disease; Phlebitis; Vasculitis Gastrointestinal Medical History: Negative for: Cirrhosis ; Colitis; Crohns; Hepatitis A; Hepatitis B; Hepatitis C Endocrine Medical History: Negative for: Type I Diabetes; Type II Diabetes Genitourinary Medical History: Negative for: End Stage Renal Disease Past Medical History Notes: kidney stone Immunological Medical History: Negative for: Lupus Erythematosus; Raynauds; Scleroderma DONTARIOUS, BRANOM ([id number]) [id number] Page 18 of 19 Integumentary (Skin) Medical History: Negative for: History of Burn Musculoskeletal Medical History: Negative for: Gout; Rheumatoid Arthritis; Osteoarthritis; Osteomyelitis Neurologic Medical History: Negative for: Dementia; Neuropathy; Quadriplegia; Paraplegia; Seizure Disorder Oncologic Medical History: Negative for: Received Chemotherapy; Received Radiation Psychiatric Medical History: Negative for: Anorexia/bulimia; Confinement Anxiety Past Medical History Notes: anxiety Immunizations Pneumococcal Vaccine: Received Pneumococcal Vaccination: No Implantable Devices None Hospitalization / Surgery History Type of  Hospitalization/Surgery COVID PNA 07/22/2019- 11/14/2019 03/27/2020 wound debridement/ skin graft Family and Social History Cancer: Yes - Maternal Grandparents; Diabetes: Yes - Father,Paternal Grandparents; Heart Disease: Yes - Maternal Grandparents; Hereditary Spherocytosis: No; Hypertension: Yes - Father,Paternal Grandparents; Kidney Disease: No; Lung Disease: Yes - Siblings; Seizures: No; Stroke: No; Thyroid Problems: No; Tuberculosis: No; Never smoker; Marital Status - Married; Alcohol Use: Never; Drug Use: No History; Caffeine Use: Daily - tea, soda; Financial Concerns: No; Food, Clothing or Shelter Needs: No; Support System Lacking: No; Transportation Concerns: No Electronic Signature(s) Signed: 05/04/2023 12:58:46 PM By: [name] MD FACS Entered By: [name] on 05/04/2023 09:29:01 -------------------------------------------------------------------------------- SuperBill Details Patient Name: Date of Service: [name], TO [name] E. 05/04/2023 Medical Record Number: [id number] Patient Account Number: [id number] Date of Birth/Sex: Treating RN: 11/07/1975 (47 y.o. M) Primary Care Provider: Dorinda Hill Other Clinician: Referring Provider: Treating Provider/Extender: Jana Hakim in Treatment: 146 Diagnosis Coding ICD-10 Codes Code Description [phone] Non-pressure chronic ulcer of other part of right foot  not appear to result in any tissue breakdown. There is a little bit of slough on the surface and minimal eschar around the edges. 03/15/2023: The wound measured narrower today. There is some slough accumulation on the surface and some eschar around the edges. 03/23/2023: The length of the wound remains stable, but it continues to narrow. The skin that has covered the posterior aspect of the calcaneus is more robust and has better tensile integrity. Minimal slough and eschar accumulation. 03/30/2023: The wound length has decreased. This seems to be primarily due to more epithelium covering the posterior aspect of the site. Minimal slough and eschar buildup. 04/07/2023: The wound measured a little bit smaller again today. There is minimal slough accumulation with just a little bit of eschar and callus around the edges. 04/13/2023: The lateral aspect of his wound is starting to epithelialize. There is very minimal slough on  the surface. Moisture control is good. 04/20/2023: Continued, albeit extremely slow, encroachment of epithelium as of the wound surface. Minimal slough. Moisture balance is good. 04/27/2023: More epithelium is filling in from the heel area. There is a little bit of slough on the surface with some periwound callus and eschar. He did note a little bit of drainage coming from his right foot and there is a small crack in the skin extending medially from the main site of his prior ulceration. 05/04/2023: The wounds are stable this week. No significant change. There is a little bit of callus buildup around the edges of each site and some thin slough on the surface on the left foot. Electronic Signature(s) Signed: 05/04/2023 12:28:54 PM By: [name] MD FACS Entered By: [name] on 05/04/2023 09:81:19 -------------------------------------------------------------------------------- Physical Exam Details Patient Name: Date of Service: [name], TO [name] E. 05/04/2023 11:15 A M Medical Record Number: [id number] Patient Account Number: [id number] Date of Birth/Sex: Treating RN: 1976-06-26 (47 y.o. M) Primary Care Provider: Dorinda Hill Other Clinician: Referring Provider: Treating Provider/Extender: Jana Hakim in Treatment: 146 Constitutional . . . . no acute distress. Respiratory Normal work of breathing on room air. Notes 05/04/2023: The wounds are stable this week. No significant change. There is a little bit of callus buildup around the edges of each site and some thin slough on the surface on the left foot. Electronic Signature(s) Signed: 05/04/2023 12:29:27 PM By: [name] MD FACS Entered By: [name] on 05/04/2023 13:08:65 [name] ([id number]) [id number] Page 8 of 19 -------------------------------------------------------------------------------- Physician Orders Details Patient Name: Date of Service: [name] [name] E. 05/04/2023 11:15 A M Medical Record Number: [id number] Patient Account Number: [id number] Date of Birth/Sex: Treating RN: 01-14-1976 (47 y.o. [name] Primary Care Provider: Dorinda Hill Other Clinician: Referring Provider: Treating Provider/Extender: Jana Hakim in Treatment: 146 The following information was scribed by: Zenaida Deed The information was scribed for: [name] Verbal / Phone Orders: No Diagnosis Coding ICD-10 Coding Code Description [phone] Non-pressure chronic ulcer of other part of right foot with fat layer exposed L97.522 Non-pressure chronic ulcer of other part of left foot with fat layer exposed Follow-up Appointments ppointment in 1 week. - Dr. Lady Gary Room 3 - Return A Anesthetic Wound #2 Left Calcaneus (In clinic) Topical Lidocaine 4% applied to wound bed - In clinic Bathing/ Shower/ Hygiene May shower and wash wound with soap and water. Edema Control - Lymphedema / SCD / Other Bilateral Lower Extremities Avoid standing for long periods of time. Moisturize legs daily. - as needed  not appear to result in any tissue breakdown. There is a little bit of slough on the surface and minimal eschar around the edges. 03/15/2023: The wound measured narrower today. There is some slough accumulation on the surface and some eschar around the edges. 03/23/2023: The length of the wound remains stable, but it continues to narrow. The skin that has covered the posterior aspect of the calcaneus is more robust and has better tensile integrity. Minimal slough and eschar accumulation. 03/30/2023: The wound length has decreased. This seems to be primarily due to more epithelium covering the posterior aspect of the site. Minimal slough and eschar buildup. 04/07/2023: The wound measured a little bit smaller again today. There is minimal slough accumulation with just a little bit of eschar and callus around the edges. 04/13/2023: The lateral aspect of his wound is starting to epithelialize. There is very minimal slough on  the surface. Moisture control is good. 04/20/2023: Continued, albeit extremely slow, encroachment of epithelium as of the wound surface. Minimal slough. Moisture balance is good. 04/27/2023: More epithelium is filling in from the heel area. There is a little bit of slough on the surface with some periwound callus and eschar. He did note a little bit of drainage coming from his right foot and there is a small crack in the skin extending medially from the main site of his prior ulceration. 05/04/2023: The wounds are stable this week. No significant change. There is a little bit of callus buildup around the edges of each site and some thin slough on the surface on the left foot. Electronic Signature(s) Signed: 05/04/2023 12:28:54 PM By: [name] MD FACS Entered By: [name] on 05/04/2023 09:81:19 -------------------------------------------------------------------------------- Physical Exam Details Patient Name: Date of Service: [name], TO [name] E. 05/04/2023 11:15 A M Medical Record Number: [id number] Patient Account Number: [id number] Date of Birth/Sex: Treating RN: 1976-06-26 (47 y.o. M) Primary Care Provider: Dorinda Hill Other Clinician: Referring Provider: Treating Provider/Extender: Jana Hakim in Treatment: 146 Constitutional . . . . no acute distress. Respiratory Normal work of breathing on room air. Notes 05/04/2023: The wounds are stable this week. No significant change. There is a little bit of callus buildup around the edges of each site and some thin slough on the surface on the left foot. Electronic Signature(s) Signed: 05/04/2023 12:29:27 PM By: [name] MD FACS Entered By: [name] on 05/04/2023 13:08:65 [name] ([id number]) [id number] Page 8 of 19 -------------------------------------------------------------------------------- Physician Orders Details Patient Name: Date of Service: [name] [name] E. 05/04/2023 11:15 A M Medical Record Number: [id number] Patient Account Number: [id number] Date of Birth/Sex: Treating RN: 01-14-1976 (47 y.o. [name] Primary Care Provider: Dorinda Hill Other Clinician: Referring Provider: Treating Provider/Extender: Jana Hakim in Treatment: 146 The following information was scribed by: Zenaida Deed The information was scribed for: [name] Verbal / Phone Orders: No Diagnosis Coding ICD-10 Coding Code Description [phone] Non-pressure chronic ulcer of other part of right foot with fat layer exposed L97.522 Non-pressure chronic ulcer of other part of left foot with fat layer exposed Follow-up Appointments ppointment in 1 week. - Dr. Lady Gary Room 3 - Return A Anesthetic Wound #2 Left Calcaneus (In clinic) Topical Lidocaine 4% applied to wound bed - In clinic Bathing/ Shower/ Hygiene May shower and wash wound with soap and water. Edema Control - Lymphedema / SCD / Other Bilateral Lower Extremities Avoid standing for long periods of time. Moisturize legs daily. - as needed

## 2023-05-04 NOTE — Progress Notes (Signed)
DAYMEON, MALARKEY ([id number]) [phone].pdf Page 1 of 10 Visit Report for 05/04/2023 Arrival Information Details Patient Name: Date of Service: [name], TO [name] 05/04/2023 11:15 A M Medical Record Number: [id number] Patient Account Number: [id number] Date of Birth/Sex: Treating RN: [birth year] (47 y.o. M) Primary Care Cornelius Schuitema: Dorinda Hill Other Clinician: Referring Shunda Rabadi: Treating Kallan Bischoff/Extender: Jana Hakim in Treatment: 146 Visit Information History Since Last Visit Added or deleted any medications: No Patient Arrived: Wheel Chair Any new allergies or adverse reactions: No Arrival Time: 11:17 Had a fall or experienced change in No Accompanied By: family activities of daily living that may affect Transfer Assistance: None risk of falls: Patient Identification Verified: Yes Signs or symptoms of abuse/neglect since last visito No Secondary Verification Process Completed: Yes Hospitalized since last visit: No Patient Requires Transmission-Based Precautions: No Implantable device outside of the clinic excluding No Patient Has Alerts: No cellular tissue based products placed in the center since last visit: Has Dressing in Place as Prescribed: Yes Has Footwear/Offloading in Place as Prescribed: Yes Left: Other:heel offloader Pain Present Now: No Electronic Signature(s) Signed: 05/04/2023 4:08:12 PM By: Zenaida Deed RN, BSN Entered By: Zenaida Deed on 05/04/2023 11:34:15 -------------------------------------------------------------------------------- Encounter Discharge Information Details Patient Name: Date of Service: [name], TO [name] 05/04/2023 11:15 A M Medical Record Number: [id number] Patient Account Number: [id number] Date of Birth/Sex: Treating RN: [birth year] (47 y.o. [name] Primary Care Jeovani Weisenburger: Dorinda Hill Other Clinician: Referring Shireen Rayburn: Treating Fishel Wamble/Extender: Jana Hakim in Treatment: 146 Encounter Discharge Information Items Post Procedure Vitals Discharge Condition: Stable Temperature (F): 98 Ambulatory Status: Wheelchair Pulse (bpm): 78 Discharge Destination: Home Respiratory Rate (breaths/min): 18 Transportation: Private Auto Blood Pressure (mmHg): 139/85 Accompanied By: spouse Schedule Follow-up Appointment: Yes Clinical Summary of Care: Patient Declined Electronic Signature(s) Signed: 05/04/2023 4:08:12 PM By: Zenaida Deed RN, BSN Entered By: Zenaida Deed on 05/04/2023 12:27:40 [name] ([id number]) [id number] Page 2 of 10 -------------------------------------------------------------------------------- Lower Extremity Assessment Details Patient Name: Date of Service: [name] 05/04/2023 11:15 A M Medical Record Number: [id number] Patient Account Number: [id number] Date of Birth/Sex: Treating RN: 1975/12/25 (47 y.o. [name] Primary Care Graziella Connery: Dorinda Hill Other Clinician: Referring Tanasia Budzinski: Treating Eastin Swing/Extender: Jana Hakim in Treatment: 146 Edema Assessment Assessed: [name]: No] Franne Forts: No] Edema: [Left: No] [Right: No] Calf Left: Right: Point of Measurement: 29 cm From Medial Instep 43.5 cm Ankle Left: Right: Point of Measurement: 9 cm From Medial Instep 24.4 cm Vascular Assessment Pulses: Dorsalis Pedis Palpable: [Left:Yes] [Right:Yes] Extremity colors, hair growth, and conditions: Extremity Color: [Left:Normal] [Right:Normal] Hair Growth on Extremity: [Left:Yes] [Right:Yes] Temperature of Extremity: [Left:Warm] [Right:Warm] Capillary Refill: [Left:< 3 seconds] [Right:< 3 seconds] Dependent Rubor: [Left:No] [Right:No] Blanched when Elevated: [Left:No] [Right:No No] Electronic Signature(s) Signed: 05/04/2023 4:08:12 PM By: Zenaida Deed RN, BSN Entered By: Zenaida Deed on 05/04/2023  11:42:07 -------------------------------------------------------------------------------- Multi Wound Chart Details Patient Name: Date of Service: [name], [name] 05/04/2023 11:15 A M Medical Record Number: [id number] Patient Account Number: [id number] Date of Birth/Sex: Treating RN: May 04, 1976 (47 y.o. M) Primary Care Tashi Andujo: Dorinda Hill Other Clinician: Referring Keyauna Graefe: Treating Thandiwe Siragusa/Extender: Jana Hakim in Treatment: 146 Vital Signs Height(in): 69 Pulse(bpm): 78 Weight(lbs): 280 Blood Pressure(mmHg): 139/85 Body Mass Index(BMI): 41.3 Temperature(F): 98.0 Respiratory Rate(breaths/min): 18 [2:Photos:] [N/A:N/A [id number] Page 3 of 10] Left Calcaneus Right, Plantar Foot N/A Wound Location: Pressure Injury Gradually Appeared N/A Wounding Event: Pressure Ulcer Pressure Ulcer N/A  Primary Etiology: Asthma, Angina, Hypertension Asthma, Angina, Hypertension N/A Comorbid History: 10/07/2019 04/27/2023 N/A Date Acquired: 146 1 N/A Weeks of Treatment: Open Open N/A Wound Status: No No N/A Wound Recurrence: 3.5x1.4x0.1 1x0.1x0.5 N/A Measurements L x W x D (cm) 3.848 0.079 N/A A (cm) : rea 0.385 0.039 N/A Volume (cm) : 85.80% -154.80% N/A % Reduction in A rea: 98.60% -1200.00% N/A % Reduction in Volume: Category/Stage III Category/Stage II N/A Classification: Medium Medium N/A Exudate A mount: Serosanguineous Serosanguineous N/A Exudate Type: red, brown red, brown N/A Exudate Color: Distinct, outline attached Distinct, outline attached N/A Wound Margin: Large (67-100%) Large (67-100%) N/A Granulation A mount: Pink Red N/A Granulation Quality: Small (1-33%) None Present (0%) N/A Necrotic A mount: Fat Layer (Subcutaneous Tissue): Yes Fat Layer (Subcutaneous Tissue): Yes N/A Exposed Structures: Fascia: No Tendon: No Muscle: No Joint: No Bone: No Small (1-33%) Small (1-33%)  N/A Epithelialization: Debridement - Selective/Open Wound Debridement - Selective/Open Wound N/A Debridement: Pre-procedure Verification/Time Out 11:50 11:50 N/A Taken: Lidocaine 4% T opical Solution Lidocaine 4% Topical Solution N/A Pain Control: Callus, Slough Callus N/A Tissue Debrided: Skin/Epidermis Skin/Epidermis N/A Level: 3.85 0.24 N/A Debridement A (sq cm): rea Curette Curette N/A Instrument: Minimum None N/A Bleeding: Pressure N/A N/A Hemostasis A chieved: 0 0 N/A Procedural Pain: 0 0 N/A Post Procedural Pain: Procedure was tolerated well Procedure was tolerated well N/A Debridement Treatment Response: 3.5x1.4x0.1 1x0.2x0.4 N/A Post Debridement Measurements L x W x D (cm) 0.385 0.063 N/A Post Debridement Volume: (cm) Category/Stage III Category/Stage II N/A Post Debridement Stage: Callus: Yes Callus: Yes N/A Periwound Skin Texture: Scarring: Yes Scarring: Yes Excoriation: No Rash: No Induration: No Crepitus: No Rash: No Dry/Scaly: Yes Dry/Scaly: Yes N/A Periwound Skin Moisture: Maceration: No Atrophie Blanche: No Hemosiderin Staining: No N/A Periwound Skin Color: Cyanosis: No Ecchymosis: No Erythema: No Hemosiderin Staining: No Mottled: No Pallor: No Rubor: No No Abnormality No Abnormality N/A Temperature: Yes N/A N/A Tenderness on Palpation: Debridement Debridement N/A Procedures Performed: Treatment Notes Wound #2 (Calcaneus) Wound Laterality: Left Cleanser Normal Saline Discharge Instruction: Cleanse the wound with Normal Saline prior to applying a clean dressing using gauze sponges, not tissue or cotton balls. Wound Cleanser Discharge Instruction: Cleanse the wound with wound cleanser prior to applying a clean dressing using gauze sponges, not tissue or cotton balls. Peri-Wound Care Ketoconazole Cream 2% ALEXI, HERRERO ([id number]) [phone].pdf Page 4 of 10 Discharge Instruction: Apply Ketoconazole mixed  with zinc to the periwound Zinc Oxide Ointment 30g tube Discharge Instruction: Apply Zinc Oxide to periwound with each dressing change Topical Gentamicin Discharge Instruction: apply to the wound bed Primary Dressing Maxorb Extra Ag+ Alginate Dressing, 4x4.75 (in/in) Discharge Instruction: Apply to wound bed as instructed Secondary Dressing Drawtex 4x4 in Discharge Instruction: Apply over primary dressing as directed. Optifoam Non-Adhesive Dressing, 4x4 in Discharge Instruction: Apply over primary dressing as directed. Woven Gauze Sponge, Non-Sterile 4x4 in Discharge Instruction: Apply over primary dressing as directed. Secured With 27M Medipore H Soft Cloth Surgical T ape, 4 x 10 (in/yd) Discharge Instruction: Secure with tape as directed. Compression Wrap Kerlix Roll 4.5x3.1 (in/yd) Discharge Instruction: Apply Kerlix and Coban compression as directed. Compression Stockings Add-Ons [employer] Offloading Boot, Size Regular Wound #5 (Foot) Wound Laterality: Plantar, Right Cleanser Peri-Wound Care Topical Gentamicin Discharge Instruction: As directed by physician Primary Dressing Maxorb Extra Ag+ Alginate Dressing, 4x4.75 (in/in) Discharge Instruction: Apply to wound bed as instructed Secondary Dressing Drawtex 4x4 in Discharge Instruction: Apply over primary dressing as directed. Optifoam Non-Adhesive Dressing,  4x4 in Discharge Instruction: Apply over primary dressing as directed. Secured With [employer], 4.5x3.1 (in/yd) Discharge Instruction: Secure with Kerlix as directed. Compression Wrap Compression Stockings Add-Ons Electronic Signature(s) Signed: 05/04/2023 12:27:54 PM By: [name] MD FACS Entered By: [name] on 05/04/2023 12:27:54 [name] ([id number]) [id number] Page 5 of 10 -------------------------------------------------------------------------------- Multi-Disciplinary Care Plan Details Patient  Name: Date of Service: [name] 05/04/2023 11:15 A M Medical Record Number: [id number] Patient Account Number: [id number] Date of Birth/Sex: Treating RN: 1975-10-11 (47 y.o. [name] Primary Care Avea Mcgowen: Dorinda Hill Other Clinician: Referring Dreshaun Stene: Treating Chelsea Nusz/Extender: Jana Hakim in Treatment: 146 Multidisciplinary Care Plan reviewed with physician Active Inactive Wound/Skin Impairment Nursing Diagnoses: Impaired tissue integrity Knowledge deficit related to ulceration/compromised skin integrity Goals: Patient/caregiver will verbalize understanding of skin care regimen Date Initiated: 07/15/2020 Target Resolution Date: 05/08/2023 Goal Status: Active Ulcer/skin breakdown will have a volume reduction of 30% by week 4 Date Initiated: 07/15/2020 Date Inactivated: 08/20/2020 Target Resolution Date: 09/03/2020 Goal Status: Unmet Unmet Reason: no major changes. Ulcer/skin breakdown will heal within 14 weeks Date Initiated: 12/04/2020 Date Inactivated: 12/10/2020 Target Resolution Date: 12/10/2020 Unmet Reason: wounds still open at 14 Goal Status: Unmet weeks and today 21 weeks. Interventions: Assess patient/caregiver ability to obtain necessary supplies Assess patient/caregiver ability to perform ulcer/skin care regimen upon admission and as needed Assess ulceration(s) every visit Provide education on ulcer and skin care Treatment Activities: Skin care regimen initiated : 07/15/2020 Topical wound management initiated : 07/15/2020 Notes: Electronic Signature(s) Signed: 05/04/2023 4:08:12 PM By: Zenaida Deed RN, BSN Entered By: Zenaida Deed on 05/04/2023 11:46:50 -------------------------------------------------------------------------------- Pain Assessment Details Patient Name: Date of Service: [name], [name] 05/04/2023 11:15 A M Medical Record Number: [id number] Patient Account Number: [id number] Date of Birth/Sex: Treating  RN: 1976/03/21 (47 y.o. M) Primary Care Ignatz Deis: Dorinda Hill Other Clinician: Referring Carrell Rahmani: Treating Joseph Johns/Extender: Jana Hakim in Treatment: 146 Active Problems Location of Pain Severity and Description of Pain Patient Has Paino No SAMIH, WITHERELL ([id number]) [phone].pdf Page 6 of 10 Patient Has Paino No Site Locations Rate the pain. Current Pain Level: 0 Pain Management and Medication Current Pain Management: Electronic Signature(s) Signed: 05/04/2023 4:08:12 PM By: Zenaida Deed RN, BSN Entered By: Zenaida Deed on 05/04/2023 11:41:27 -------------------------------------------------------------------------------- Patient/Caregiver Education Details Patient Name: Date of Service: [name], TO [place] 9/26/2024andnbsp11:15 A M Medical Record Number: [id number] Patient Account Number: [id number] Date of Birth/Gender: Treating RN: 05/09/1976 (47 y.o. [name] Primary Care Physician: Dorinda Hill Other Clinician: Referring Physician: Treating Physician/Extender: Jana Hakim in Treatment: 146 Education Assessment Education Provided To: Patient Education Topics Provided Elevated Blood Sugar/ Impact on Healing: Methods: Explain/Verbal Responses: Reinforcements needed, State content correctly Offloading: Methods: Explain/Verbal Responses: Reinforcements needed, State content correctly Wound/Skin Impairment: Methods: Explain/Verbal Responses: Reinforcements needed, State content correctly Electronic Signature(s) Signed: 05/04/2023 4:08:12 PM By: Zenaida Deed RN, BSN Entered By: Zenaida Deed on 05/04/2023 11:47:28 [name] ([id number]) [id number] Page 7 of 10 -------------------------------------------------------------------------------- Wound Assessment Details Patient Name: Date of Service: [name] 05/04/2023 11:15 A M Medical  Record Number: [id number] Patient Account Number: [id number] Date of Birth/Sex: Treating RN: 1976-02-07 (47 y.o. [name] Primary Care Tyde Lamison: Dorinda Hill Other Clinician: Referring Marry Kusch: Treating Tarahji Ramthun/Extender: Jana Hakim in Treatment: 146 Wound Status Wound Number: 2 Primary Etiology: Pressure Ulcer Wound Location: Left Calcaneus Wound Status: Open Wounding Event: Pressure Injury Comorbid  History: Asthma, Angina, Hypertension Date Acquired: 10/07/2019 Weeks Of Treatment: 146 Clustered Wound: No Photos Wound Measurements Length: (cm) 3 Width: (cm) 1 Depth: (cm) 0 Area: (cm) Volume: (cm) .5 % Reduction in Area: 85.8% .4 % Reduction in Volume: 98.6% .1 Epithelialization: Small (1-33%) 3.848 Tunneling: No 0.385 Undermining: No Wound Description Classification: Category/Stage III Wound Margin: Distinct, outline attached Exudate Amount: Medium Exudate Type: Serosanguineous Exudate Color: red, brown Foul Odor After Cleansing: No Slough/Fibrino No Wound Bed Granulation Amount: Large (67-100%) Exposed Structure Granulation Quality: Pink Fascia Exposed: No Necrotic Amount: Small (1-33%) Fat Layer (Subcutaneous Tissue) Exposed: Yes Necrotic Quality: Adherent Slough Tendon Exposed: No Muscle Exposed: No Joint Exposed: No Bone Exposed: No Periwound Skin Texture Texture Color No Abnormalities Noted: No No Abnormalities Noted: Yes Callus: Yes Temperature / Pain Crepitus: No Temperature: No Abnormality Excoriation: No Tenderness on Palpation: Yes Induration: No Rash: No Scarring: Yes Moisture No Abnormalities Noted: Yes Treatment Notes MADDOXX, PORTNOY ([id number]) [id number] Page 8 of 10 Wound #2 (Calcaneus) Wound Laterality: Left Cleanser Normal Saline Discharge Instruction: Cleanse the wound with Normal Saline prior to applying a clean dressing using gauze sponges, not tissue or cotton  balls. Wound Cleanser Discharge Instruction: Cleanse the wound with wound cleanser prior to applying a clean dressing using gauze sponges, not tissue or cotton balls. Peri-Wound Care Ketoconazole Cream 2% Discharge Instruction: Apply Ketoconazole mixed with zinc to the periwound Zinc Oxide Ointment 30g tube Discharge Instruction: Apply Zinc Oxide to periwound with each dressing change Topical Gentamicin Discharge Instruction: apply to the wound bed Primary Dressing Maxorb Extra Ag+ Alginate Dressing, 4x4.75 (in/in) Discharge Instruction: Apply to wound bed as instructed Secondary Dressing Drawtex 4x4 in Discharge Instruction: Apply over primary dressing as directed. Optifoam Non-Adhesive Dressing, 4x4 in Discharge Instruction: Apply over primary dressing as directed. Woven Gauze Sponge, Non-Sterile 4x4 in Discharge Instruction: Apply over primary dressing as directed. Secured With 25M Medipore H Soft Cloth Surgical T ape, 4 x 10 (in/yd) Discharge Instruction: Secure with tape as directed. Compression Wrap Kerlix Roll 4.5x3.1 (in/yd) Discharge Instruction: Apply Kerlix and Coban compression as directed. Compression Stockings Add-Ons [employer] Offloading Boot, Size Regular Electronic Signature(s) Signed: 05/04/2023 4:08:12 PM By: Zenaida Deed RN, BSN Entered By: Zenaida Deed on 05/04/2023 11:44:34 -------------------------------------------------------------------------------- Wound Assessment Details Patient Name: Date of Service: [name], TO [name] 05/04/2023 11:15 A M Medical Record Number: [id number] Patient Account Number: [id number] Date of Birth/Sex: Treating RN: 1976-02-04 (47 y.o. [name] Primary Care Joliyah Lippens: Dorinda Hill Other Clinician: Referring Kaylah Chiasson: Treating Saifullah Jolley/Extender: Jana Hakim in Treatment: 146 Wound Status Wound Number: 5 Primary Etiology: Pressure Ulcer Wound Location: Right, Plantar Foot Wound  Status: Open Wounding Event: Gradually Appeared Comorbid History: Asthma, Angina, Hypertension Date Acquired: 04/27/2023 Weeks Of Treatment: 1 Clustered Wound: No Photos TORREZ, WHACK ([id number]) [phone].pdf Page 9 of 10 Wound Measurements Length: (cm) 1 Width: (cm) 0.1 Depth: (cm) 0.5 Area: (cm) 0.079 Volume: (cm) 0.039 % Reduction in Area: -154.8% % Reduction in Volume: -1200% Epithelialization: Small (1-33%) Tunneling: No Undermining: No Wound Description Classification: Category/Stage II Wound Margin: Distinct, outline attached Exudate Amount: Medium Exudate Type: Serosanguineous Exudate Color: red, brown Foul Odor After Cleansing: No Slough/Fibrino Yes Wound Bed Granulation Amount: Large (67-100%) Exposed Structure Granulation Quality: Red Fat Layer (Subcutaneous Tissue) Exposed: Yes Necrotic Amount: None Present (0%) Periwound Skin Texture Texture Color No Abnormalities Noted: No No Abnormalities Noted: Yes Callus: Yes Temperature / Pain Rash: No Temperature: No Abnormality Scarring: Yes Moisture No Abnormalities  Noted: No Dry / Scaly: Yes Treatment Notes Wound #5 (Foot) Wound Laterality: Plantar, Right Cleanser Peri-Wound Care Topical Gentamicin Discharge Instruction: As directed by physician Primary Dressing Maxorb Extra Ag+ Alginate Dressing, 4x4.75 (in/in) Discharge Instruction: Apply to wound bed as instructed Secondary Dressing Drawtex 4x4 in Discharge Instruction: Apply over primary dressing as directed. Optifoam Non-Adhesive Dressing, 4x4 in Discharge Instruction: Apply over primary dressing as directed. Secured With [employer], 4.5x3.1 (in/yd) Discharge Instruction: Secure with Kerlix as directed. Compression Wrap Compression Stockings Add-Ons TERRIANCE, PAVESE ([id number]) [phone].pdf Page 10 of 10 Electronic Signature(s) Signed: 05/04/2023 4:08:12 PM By: Zenaida Deed RN,  BSN Entered By: Zenaida Deed on 05/04/2023 11:48:17 -------------------------------------------------------------------------------- Vitals Details Patient Name: Date of Service: [name], [name] 05/04/2023 11:15 A M Medical Record Number: [id number] Patient Account Number: [id number] Date of Birth/Sex: Treating RN: 08/16/1975 (47 y.o. M) Primary Care Felicie Kocher: Dorinda Hill Other Clinician: Referring Radames Mejorado: Treating Tung Pustejovsky/Extender: Jana Hakim in Treatment: 146 Vital Signs Time Taken: 11:18 Temperature (F): 98.0 Height (in): 69 Pulse (bpm): 78 Weight (lbs): 280 Respiratory Rate (breaths/min): 18 Body Mass Index (BMI): 41.3 Blood Pressure (mmHg): 139/85 Reference Range: 80 - 120 mg / dl Electronic Signature(s) Signed: 05/04/2023 4:08:12 PM By: Zenaida Deed RN, BSN Entered By: Zenaida Deed on 05/04/2023 11:34:23

## 2023-05-08 ENCOUNTER — Encounter: Payer: PPO | Attending: Physical Medicine and Rehabilitation | Admitting: Physical Medicine and Rehabilitation

## 2023-05-08 ENCOUNTER — Encounter: Payer: Self-pay | Admitting: Physical Medicine and Rehabilitation

## 2023-05-08 VITALS — BP 118/75 | HR 66 | Ht 68.5 in | Wt 303.8 lb

## 2023-05-08 DIAGNOSIS — G7281 Critical illness myopathy: Secondary | ICD-10-CM

## 2023-05-08 DIAGNOSIS — M21372 Foot drop, left foot: Secondary | ICD-10-CM | POA: Diagnosis not present

## 2023-05-08 DIAGNOSIS — G6281 Critical illness polyneuropathy: Secondary | ICD-10-CM

## 2023-05-08 DIAGNOSIS — Z993 Dependence on wheelchair: Secondary | ICD-10-CM

## 2023-05-08 MED ORDER — HYDROCODONE-ACETAMINOPHEN 5-325 MG PO TABS: 1.0000 | ORAL_TABLET | Freq: Two times a day (BID) | ORAL | 0 refills | Status: DC | PRN

## 2023-05-08 NOTE — Progress Notes (Signed)
Subjective:    Patient ID: [name], male    DOB: 1976-04-01, 47 y.o.   MRN: [id number]  HPI  Pt is a 47 yr old male with COVID ICU myopathy, Long COVID,  s/p surgery on feet due to necrosis from long ICU stay/pressors to save life-s/p  skin grafts and R foot osteomyelitis as well. Occurred 12/20.  Also has L foot drop. Still w/c dependent- still cannot put weight on feet per Plastics.  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  Sent to wrong pharmacy and insurance wouldn't cover it.    Feet-  had a little issue on R foot-  Not moisturizing so got nice and cracked up open a little.   Now moisturizing- foot butter- - thick cream L foot- doing pretty good.  Threatening with power w/c-  Still slow-   Asked for print off of measurements of L foot wound- hasn't taken picture lately.   Doing steps leading up to the gastric sleeve surgery.  Has lost  12 lbs since last time weighed Is 303 lbs today with clothes on (was as low as 298 lbs at 1 time).   Shops very different from how used to shop- having to change mindset.  Biggest hurdle is eating out and soda.  Seeing PT as well as dietitian   Had vasectomy done- no pain meds given.  2 weeks ago-    Seeing therapist for depression-  Still depressed and issues with wife.  Trying to stay off feet more- because R foot issues.  Does a little more "walking than probably should".  Makes it hard to do things-  Went back on Metoprolol- PCP- BP was a little high.  118/75 today and HR 68 A1c 5.7-     Pain Inventory Average Pain 5 Pain Right Now 5 My pain is constant and sharp  LOCATION OF PAIN  back knees feet  BOWEL Number of stools per week: 7   BLADDER Normal In and out cath, frequency n/a  Mobility ability to climb steps?  yes do you drive?  yes use a wheelchair transfers alone  Function disabled: date disabled   I need assistance with the following:  bathing,  meal prep, household duties, and shopping  Neuro/Psych trouble walking spasms depression  Prior Studies Any changes since last visit?  no  Physicians involved in your care Any changes since last visit?  no   Family History  Problem Relation Age of Onset   Migraines Mother    GER disease Mother    Heart disease Father    Hyperlipidemia Father    Diabetes Father    Hypertension Father    Sleep apnea Father    Obesity Father    Asthma Brother    Pancreatic cancer Maternal Grandmother    Heart disease Maternal Grandfather    Diabetes Paternal Grandmother    Hypertension Paternal Grandmother    Social History   Socioeconomic History   Marital status: Married    Spouse name: Not on file   Number of children: 1   Years of education: Not on file   Highest education level: Not on file  Occupational History   Occupation: delivery driver   Occupation: Stay at home spouse  Tobacco Use   Smoking status: Never   Smokeless tobacco: Never  Vaping Use   Vaping status: Never Used  Substance and Sexual Activity   Alcohol use: Yes  Comment: rarely 2-3 times a year   Drug use: No   Sexual activity: Yes    Partners: Female  Other Topics Concern   Not on file  Social History Narrative   Not on file   Social Determinants of Health   Financial Resource Strain: Not on file  Food Insecurity: Not on file  Transportation Needs: Not on file  Physical Activity: Not on file  Stress: Not on file  Social Connections: Not on file   Past Surgical History:  Procedure Laterality Date   CANNULATION FOR ECMO (EXTRACORPOREAL MEMBRANE OXYGENATION) N/A 08/28/2019   Procedure: CANNULATION FOR VV ECMO (EXTRACORPOREAL MEMBRANE OXYGENATION);  Surgeon: Donata Clay, Theron Arista, MD;  Location: [facility] OR;  Service: Open Heart Surgery;  Laterality: N/A;  CRESCENT CANNULA   CANNULATION FOR ECMO (EXTRACORPOREAL MEMBRANE OXYGENATION) N/A 09/10/2019   Procedure: CANNULATION FOR ECMO (EXTRACORPOREAL MEMBRANE  OXYGENATION) PUTTING IN CRESCENT 32FR CANNULA  AND REMOVING GROING CANNULATION;  Surgeon: Linden Dolin, MD;  Location: [facility] OR;  Service: Open Heart Surgery;  Laterality: N/A;  PUTTING IN CRESCENT/REMOVING GROIN CANNULATION   CYSTOSCOPY/URETEROSCOPY/HOLMIUM LASER/STENT PLACEMENT Left 04/18/2019   Procedure: LEFT URETEROSCOPY/HOLMIUM LASER/STENT PLACEMENT;  Surgeon: Crist Fat, MD;  Location: [facility];  Service: Urology;  Laterality: Left;   CYSTOSCOPY/URETEROSCOPY/HOLMIUM LASER/STENT PLACEMENT Left 05/02/2019   Procedure: CYSTOSCOPY/URETEROSCOPY/HOLMIUM LASER/STENT EXCHANGE;  Surgeon: Crist Fat, MD;  Location: [facility];  Service: Urology;  Laterality: Left;   ECMO CANNULATION N/A 08/03/2019   Procedure: ECMO CANNULATION;  Surgeon: Linden Dolin, MD;  Location: [facility];  Service: Cardiovascular;  Laterality: N/A;   ESOPHAGOGASTRODUODENOSCOPY N/A 09/11/2019   Procedure: ESOPHAGOGASTRODUODENOSCOPY (EGD);  Surgeon: Linden Dolin, MD;  Location: [name] OR;  Service: Thoracic;  Laterality: N/A;   GRAFT APPLICATION Bilateral 03/27/2020   Procedure: APPLICATION OF SKIN GRAFT BILATERAL FEET;  Surgeon: Felecia Shelling, DPM;  Location: [facility];  Service: Podiatry;  Laterality: Bilateral;   IR REPLC GASTRO/COLONIC TUBE PERCUT W/FLUORO  10/14/2019   IRRIGATION AND DEBRIDEMENT SHOULDER Right 09/29/2017   Procedure: IRRIGATION AND DEBRIDEMENT SHOULDER;  Surgeon: Tarry Kos, MD;  Location: [facility] OR;  Service: Orthopedics;  Laterality: Right;   LUMBAR DISC SURGERY  2002   NASAL ENDOSCOPY WITH EPISTAXIS CONTROL N/A 08/31/2019   Procedure: NASAL ENDOSCOPY WITH EPISTAXIS CONTROL WITH CAUTERIZATION;  Surgeon: Christia Reading, MD;  Location: [facility] OR;  Service: ENT;  Laterality: N/A;   PORTACATH PLACEMENT N/A 09/11/2019   Procedure: PEG TUBE INSERTION - BEDSIDE;  Surgeon: Linden Dolin, MD;  Location: [facility] OR;  Service: Thoracic;  Laterality: N/A;   TEE WITHOUT CARDIOVERSION N/A  08/28/2019   Procedure: TRANSESOPHAGEAL ECHOCARDIOGRAM (TEE);  Surgeon: Donata Clay, Theron Arista, MD;  Location: [facility] OR;  Service: Open Heart Surgery;  Laterality: N/A;   TEE WITHOUT CARDIOVERSION N/A 09/10/2019   Procedure: TRANSESOPHAGEAL ECHOCARDIOGRAM (TEE);  Surgeon: Linden Dolin, MD;  Location: [facility] OR;  Service: Open Heart Surgery;  Laterality: N/A;   WOUND DEBRIDEMENT Bilateral 03/27/2020   Procedure: EXCISIONAL DEBRIDEMENT OF ULCERS BILATERAL FEET;  Surgeon: Felecia Shelling, DPM;  Location: [facility];  Service: Podiatry;  Laterality: Bilateral;   Past Medical History:  Diagnosis Date   Anginal pain (HCC)    with covid   Anxiety    Asthma    Depression    Dyspnea    GERD (gastroesophageal reflux disease)    Headache    History of kidney stones    LEFT URETERAL STONE   HTN (hypertension)  Pancreatitis 2018   GALLBALDDER SLUDGE CAUSED ISSUED RESOLVED   Pneumonia 07/2019   covid   Prediabetes    SOBOE (shortness of breath on exertion)    BP 118/75   Pulse 66   Ht 5' 8.5" (1.74 m)   Wt (!) 303 lb 12.8 oz (137.8 kg)   SpO2 93%   BMI 45.52 kg/m   Opioid Risk Score:   Fall Risk Score:  `1  Depression screen [facility] 2/9     05/08/2023    9:43 AM 04/14/2023   11:23 AM 01/27/2023   10:41 AM 09/07/2022   10:07 AM 07/25/2022    9:46 AM 04/01/2022    9:53 AM 10/25/2021    1:45 PM  Depression screen PHQ 2/9  Decreased Interest 3 0 0 3 1 1    Down, Depressed, Hopeless 3 0 0 3 1 1    PHQ - 2 Score 6 0 0 6 2 2    Altered sleeping    3     Tired, decreased energy    3     Change in appetite    2     Feeling bad or failure about yourself     1     Trouble concentrating    2     Moving slowly or fidgety/restless    1     Suicidal thoughts    0     PHQ-9 Score    18     Difficult doing work/chores    Very difficult        Information is confidential and restricted. Go to Review Flowsheets to unlock data.    Review of Systems  Constitutional: Negative.   HENT: Negative.    Respiratory:  Negative.    Cardiovascular: Negative.   Gastrointestinal: Negative.   Endocrine: Negative.   Genitourinary: Negative.   Musculoskeletal:  Positive for back pain and gait problem.  Skin: Negative.   Allergic/Immunologic: Negative.   Hematological: Negative.   Psychiatric/Behavioral:  Positive for dysphoric mood.   All other systems reviewed and are negative.      Objective:   Physical Exam  Awake, alert, appropriate, accompanied by wife, in manual w/c, NAD  Constantly shaking legs- Both feet in kerlex and wearing L foot healing shoe.         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 Occurred 12/20.  Also has L foot drop. Still w/c dependent- still cannot put weight on feet per Plastics.  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   2. Will send another Rx for pain meds- was sent to wrong pharmacy- and insurance wouldn't pay for it. Per pt.  Norco 5/325 mg up to 2x/day as needed- #60- sent to [facility].   3. Got vasectomy 2 weeks ago.    4. Working on getting gastric sleeve- doing things to meet criteria. Discussed this with pt and wife- about sleeve vs bypass.  Also discussed why needs to eat - not "like a bird" because changes could mess with metabolism.    5.  Weight 303 down from 314 at last appointment 3 months ago.    6.  In funk- so not doing w/c exercises- bands, etc.  Suggest writing the  reminders to do exercises/on mirror- as reminder- so wife doesn't have to.    7. Feels like being cheated- suggest volunteering- the goal is to deal with grief in positive way.   8. Needs to keep off wound on L foot- as much as possible- you are slowing down the process of healing.   9. F/U  3 months double appt- ICU  myopathy   I spent a total of  34  minutes on total care today- >50% coordination of care- due to  discussion about weight, L foot; doing exercises; L foot wound;

## 2023-05-08 NOTE — Patient Instructions (Signed)
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 Occurred 12/20.  Also has L foot drop. Still w/c dependent- still cannot put weight on feet per Plastics.  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   2. Will send another Rx for pain meds- was sent to wrong pharmacy- and insurance wouldn't pay for it. Per pt.  Norco 5/325 mg up to 2x/day as needed- #60- sent to [facility].   3. Got vasectomy 2 weeks ago.    4. Working on getting gastric sleeve- doing things to meet criteria. Discussed this with pt and wife- about sleeve vs bypass.  Also discussed why needs to eat - not "like a bird" because changes could mess with metabolism.    5.  Weight 303 down from 314 at last appointment 3 months ago.    6.  In funk- so not doing w/c exercises- bands, etc.  Suggest writing the reminders to do exercises/on mirror- as reminder- so wife doesn't have to.    7. Feels like being cheated- suggest volunteering- the goal is to deal with grief in positive way.   8. Needs to keep off wound on L foot- as much as possible- you are slowing down the process of healing.   9. F/U  3 months double appt- ICU myopathy

## 2023-05-11 ENCOUNTER — Encounter (HOSPITAL_BASED_OUTPATIENT_CLINIC_OR_DEPARTMENT_OTHER): Payer: PPO | Attending: General Surgery | Admitting: General Surgery

## 2023-05-11 DIAGNOSIS — J45909 Unspecified asthma, uncomplicated: Secondary | ICD-10-CM | POA: Insufficient documentation

## 2023-05-11 DIAGNOSIS — I10 Essential (primary) hypertension: Secondary | ICD-10-CM | POA: Diagnosis not present

## 2023-05-11 DIAGNOSIS — Z8616 Personal history of COVID-19: Secondary | ICD-10-CM | POA: Diagnosis not present

## 2023-05-11 DIAGNOSIS — L97522 Non-pressure chronic ulcer of other part of left foot with fat layer exposed: Secondary | ICD-10-CM | POA: Insufficient documentation

## 2023-05-11 DIAGNOSIS — L97512 Non-pressure chronic ulcer of other part of right foot with fat layer exposed: Secondary | ICD-10-CM | POA: Diagnosis not present

## 2023-05-11 DIAGNOSIS — L89623 Pressure ulcer of left heel, stage 3: Secondary | ICD-10-CM | POA: Diagnosis not present

## 2023-05-11 DIAGNOSIS — L89892 Pressure ulcer of other site, stage 2: Secondary | ICD-10-CM | POA: Diagnosis not present

## 2023-05-11 NOTE — Progress Notes (Signed)
1 x Per Day/Other:until healed Discharge Instructions: Apply over primary dressing as directed. Secondary Dressing: Woven Gauze Sponge, Non-Sterile 4x4 in (Generic) 1 x Per Day/Other:until healed Discharge Instructions: Apply over primary dressing as directed. Secured With: 58M Medipore H Soft Cloth Surgical T ape, 4 x 10 (in/yd) (Generic) 1 x Per Day/Other:until healed Discharge Instructions: Secure with tape as directed. Com pression Wrap: Kerlix Roll 4.5x3.1 (in/yd) (Generic) 1 x Per Day/Other:until healed Discharge Instructions: Apply Kerlix and Coban compression as directed. Add-Ons: [employer] Offloading Boot, Size Regular 1 x Per Day/Other:until healed WOUND #5: - Foot Wound Laterality: Plantar, Right Topical: Gentamicin 1 x Per Day/30 Days Discharge Instructions: As directed by physician Prim Dressing: Maxorb Extra Ag+ Alginate Dressing, 4x4.75 (in/in) 1 x Per Day/30 Days ary Discharge Instructions: Apply [name] wound bed as instructed Secondary Dressing: Drawtex 4x4 in 1 x Per Day/30 Days Discharge Instructions: Apply over primary dressing as directed. Secondary Dressing: Optifoam Non-Adhesive Dressing, 4x4 in 1 x Per Day/30 Days Discharge Instructions: Apply over primary dressing as directed. Secured With: [employer], 4.5x3.1 (in/yd) 1 x Per Day/30  Days Discharge Instructions: Secure with Kerlix as directed. 05/11/2023: The skin on the right heel is filling in further. There is a little bit of slough accumulation on both surfaces along with periwound callus accumulation. I used a curette [name] debride slough and callus from both wounds. We will continue topical gentamicin with silver alginate and drawtex bilaterally. The patient asked if a plastic surgery referral might be appropriate. Although I am not sure what options they might have [name] offer, we can certainly at least get their input and so a referral was sent today. He will follow-up in 1 week. Electronic Signature(s) Signed: 05/11/2023 11:26:01 AM By: [name] MD FACS Entered By: [name] on 05/11/2023 08:26:01 -------------------------------------------------------------------------------- HxROS Details Patient Name: Date of Service: [name], [name] [place] E. 05/11/2023 10:15 A M Medical Record Number: [id number] Patient Account Number: [id number] Date of Birth/Sex: Treating RN: Dec 21, 1975 (47 y.o. M) Primary Care Provider: Dorinda Hill Other Clinician: Referring Provider: Treating Provider/Extender: Jana Hakim in Treatment: 147 Information Obtained From Patient Constitutional Symptoms (General Health) Medical History: Past Medical History Notes: COVID PNA 07/22/2019-11/14/2019 VENT ECMO, foot drop left foot , Eyes Medical History: Negative for: Cataracts; Glaucoma; Optic Neuritis [name], [name] ([id number]) [id number].pdf Page 18 of 19 Ear/Nose/Mouth/Throat Medical History: Negative for: Chronic sinus problems/congestion; Middle ear problems Hematologic/Lymphatic Medical History: Negative for: Anemia; Hemophilia; Human Immunodeficiency Virus; Lymphedema; Sickle Cell Disease Respiratory Medical History: Positive for: Asthma Negative for: Aspiration; Chronic Obstructive Pulmonary Disease (COPD); Pneumothorax; Sleep  Apnea; Tuberculosis Cardiovascular Medical History: Positive for: Angina - with COVID; Hypertension Negative for: Arrhythmia; Congestive Heart Failure; Coronary Artery Disease; Deep Vein Thrombosis; Hypotension; Myocardial Infarction; Peripheral Arterial Disease; Peripheral Venous Disease; Phlebitis; Vasculitis Gastrointestinal Medical History: Negative for: Cirrhosis ; Colitis; Crohns; Hepatitis A; Hepatitis B; Hepatitis C Endocrine Medical History: Negative for: Type I Diabetes; Type II Diabetes Genitourinary Medical History: Negative for: End Stage Renal Disease Past Medical History Notes: kidney stone Immunological Medical History: Negative for: Lupus Erythematosus; Raynauds; Scleroderma Integumentary (Skin) Medical History: Negative for: History of Burn Musculoskeletal Medical History: Negative for: Gout; Rheumatoid Arthritis; Osteoarthritis; Osteomyelitis Neurologic Medical History: Negative for: Dementia; Neuropathy; Quadriplegia; Paraplegia; Seizure Disorder Oncologic Medical History: Negative for: Received Chemotherapy; Received Radiation Psychiatric Medical History: Negative for: Anorexia/bulimia; Confinement Anxiety Past Medical History Notes: anxiety Immunizations Pneumococcal Vaccine: Received Pneumococcal Vaccination: No Implantable Devices [name], [name] ([id number]) [id number].pdf Page [phone]  1 x Per Day/Other:until healed Discharge Instructions: Apply over primary dressing as directed. Secondary Dressing: Woven Gauze Sponge, Non-Sterile 4x4 in (Generic) 1 x Per Day/Other:until healed Discharge Instructions: Apply over primary dressing as directed. Secured With: 58M Medipore H Soft Cloth Surgical T ape, 4 x 10 (in/yd) (Generic) 1 x Per Day/Other:until healed Discharge Instructions: Secure with tape as directed. Com pression Wrap: Kerlix Roll 4.5x3.1 (in/yd) (Generic) 1 x Per Day/Other:until healed Discharge Instructions: Apply Kerlix and Coban compression as directed. Add-Ons: Rooke [employer] Offloading Boot, Size Regular 1 x Per Day/Other:until healed WOUND #5: - Foot Wound Laterality: Plantar, Right Topical: Gentamicin 1 x Per Day/30 Days Discharge Instructions: As directed by physician Prim Dressing: Maxorb Extra Ag+ Alginate Dressing, 4x4.75 (in/in) 1 x Per Day/30 Days ary Discharge Instructions: Apply [name] wound bed as instructed Secondary Dressing: Drawtex 4x4 in 1 x Per Day/30 Days Discharge Instructions: Apply over primary dressing as directed. Secondary Dressing: Optifoam Non-Adhesive Dressing, 4x4 in 1 x Per Day/30 Days Discharge Instructions: Apply over primary dressing as directed. Secured With: [employer], 4.5x3.1 (in/yd) 1 x Per Day/30  Days Discharge Instructions: Secure with Kerlix as directed. 05/11/2023: The skin on the right heel is filling in further. There is a little bit of slough accumulation on both surfaces along with periwound callus accumulation. I used a curette [name] debride slough and callus from both wounds. We will continue topical gentamicin with silver alginate and drawtex bilaterally. The patient asked if a plastic surgery referral might be appropriate. Although I am not sure what options they might have [name] offer, we can certainly at least get their input and so a referral was sent today. He will follow-up in 1 week. Electronic Signature(s) Signed: 05/11/2023 11:26:01 AM By: [name] MD FACS Entered By: [name] on 05/11/2023 08:26:01 -------------------------------------------------------------------------------- HxROS Details Patient Name: Date of Service: [name], [name] [place] E. 05/11/2023 10:15 A M Medical Record Number: [id number] Patient Account Number: [id number] Date of Birth/Sex: Treating RN: Dec 21, 1975 (47 y.o. M) Primary Care Provider: Dorinda Hill Other Clinician: Referring Provider: Treating Provider/Extender: Jana Hakim in Treatment: 147 Information Obtained From Patient Constitutional Symptoms (General Health) Medical History: Past Medical History Notes: COVID PNA 07/22/2019-11/14/2019 VENT ECMO, foot drop left foot , Eyes Medical History: Negative for: Cataracts; Glaucoma; Optic Neuritis [name], [name] ([id number]) [id number].pdf Page 18 of 19 Ear/Nose/Mouth/Throat Medical History: Negative for: Chronic sinus problems/congestion; Middle ear problems Hematologic/Lymphatic Medical History: Negative for: Anemia; Hemophilia; Human Immunodeficiency Virus; Lymphedema; Sickle Cell Disease Respiratory Medical History: Positive for: Asthma Negative for: Aspiration; Chronic Obstructive Pulmonary Disease (COPD); Pneumothorax; Sleep  Apnea; Tuberculosis Cardiovascular Medical History: Positive for: Angina - with COVID; Hypertension Negative for: Arrhythmia; Congestive Heart Failure; Coronary Artery Disease; Deep Vein Thrombosis; Hypotension; Myocardial Infarction; Peripheral Arterial Disease; Peripheral Venous Disease; Phlebitis; Vasculitis Gastrointestinal Medical History: Negative for: Cirrhosis ; Colitis; Crohns; Hepatitis A; Hepatitis B; Hepatitis C Endocrine Medical History: Negative for: Type I Diabetes; Type II Diabetes Genitourinary Medical History: Negative for: End Stage Renal Disease Past Medical History Notes: kidney stone Immunological Medical History: Negative for: Lupus Erythematosus; Raynauds; Scleroderma Integumentary (Skin) Medical History: Negative for: History of Burn Musculoskeletal Medical History: Negative for: Gout; Rheumatoid Arthritis; Osteoarthritis; Osteomyelitis Neurologic Medical History: Negative for: Dementia; Neuropathy; Quadriplegia; Paraplegia; Seizure Disorder Oncologic Medical History: Negative for: Received Chemotherapy; Received Radiation Psychiatric Medical History: Negative for: Anorexia/bulimia; Confinement Anxiety Past Medical History Notes: anxiety Immunizations Pneumococcal Vaccine: Received Pneumococcal Vaccination: No Implantable Devices [name], [name] ([id number]) [id number].pdf Page [phone]  as needed Off-Loading Other: - keep pressure off of the bottom of your feet. Elevate legs throughout the day. Use the Shoe with the PegAssist off-loading insole Additional Orders / Instructions Follow Nutritious Diet - Try [name] get 70-100 grams of Protein a day+ Other: - 500 mg per day [name] start, then increase [name] 500mg  twice a day. Also consider adding a zinc supplement. Juven Shake 1-2 times daily. Wound Treatment Wound #2 - Calcaneus Wound Laterality: Left Cleanser: Normal Saline (Generic) 1 x Per Day/Other:until healed Discharge Instructions: Cleanse the wound with Normal Saline prior [name] applying a clean dressing using gauze sponges, not tissue or cotton balls. Cleanser: Wound Cleanser (Generic) 1 x Per Day/Other:until healed Discharge Instructions: Cleanse the wound with wound cleanser prior [name]  applying a clean dressing using gauze sponges, not tissue or cotton balls. Peri-Wound Care: Ketoconazole Cream 2% (Generic) 1 x Per Day/Other:until healed Discharge Instructions: Apply Ketoconazole mixed with zinc [name] the periwound Peri-Wound Care: Zinc Oxide Ointment 30g tube (Generic) 1 x Per Day/Other:until healed Discharge Instructions: Apply Zinc Oxide [name] periwound with each dressing change Topical: Gentamicin (Generic) 1 x Per Day/Other:until healed Discharge Instructions: apply [name] the wound bed Prim Dressing: Maxorb Extra Ag+ Alginate Dressing, 4x4.75 (in/in) (Generic) 1 x Per Day/Other:until healed ary Discharge Instructions: Apply [name] wound bed as instructed Secondary Dressing: Drawtex 4x4 in (Generic) 1 x Per Day/Other:until healed Discharge Instructions: Apply over primary dressing as directed. Secondary Dressing: Optifoam Non-Adhesive Dressing, 4x4 in (Generic) 1 x Per Day/Other:until healed Discharge Instructions: Apply over primary dressing as directed. Secondary Dressing: Woven Gauze Sponge, Non-Sterile 4x4 in (Generic) 1 x Per Day/Other:until healed Discharge Instructions: Apply over primary dressing as directed. Secured With: 22M Medipore H Soft Cloth Surgical T ape, 4 x 10 (in/yd) (Generic) 1 x Per Day/Other:until healed Discharge Instructions: Secure with tape as directed. Compression Wrap: Kerlix Roll 4.5x3.1 (in/yd) (Generic) 1 x Per Day/Other:until healed Discharge Instructions: Apply Kerlix and Coban compression as directed. [name], [name] ([id number]) [id number].pdf Page 9 of 19 Add-Ons: Rooke V[employer] Offloading Boot, Size Regular 1 x Per Day/Other:until healed Wound #5 - Foot Wound Laterality: Plantar, Right Topical: Gentamicin 1 x Per Day/30 Days Discharge Instructions: As directed by physician Prim Dressing: Maxorb Extra Ag+ Alginate Dressing, 4x4.75 (in/in) 1 x Per Day/30 Days ary Discharge Instructions: Apply [name] wound bed as  instructed Secondary Dressing: Drawtex 4x4 in 1 x Per Day/30 Days Discharge Instructions: Apply over primary dressing as directed. Secondary Dressing: Optifoam Non-Adhesive Dressing, 4x4 in 1 x Per Day/30 Days Discharge Instructions: Apply over primary dressing as directed. Secured With: [employer], 4.5x3.1 (in/yd) 1 x Per Day/30 Days Discharge Instructions: Secure with Kerlix as directed. Consults Plastic Surgery - Referral [name] plastic surgery. Evaluate for tissue coverage options for bilateral plantar foot ulcers. Electronic Signature(s) Signed: 05/11/2023 12:55:03 PM By: [name] MD FACS Entered By: [name] on 05/11/2023 08:25:08 Prescription 05/11/2023 -------------------------------------------------------------------------------- Kathryne Gin MD Patient Name: Provider: Sep 15, 1975 [id number] Date of Birth: NPI#: [id number] Sex: DEA #: [phone] Phone #: License #: UPN: Patient Address: [address] [facility] [place], [place] [address] [address] [place], [place] [address] [phone] Allergies No Known Drug Allergies Provider's Orders Plastic Surgery - Referral [name] plastic surgery. Evaluate for tissue coverage options for bilateral plantar foot ulcers. Hand Signature: Date(s): Electronic Signature(s) Signed: 05/11/2023 11:26:25 AM By: [name] MD FACS Entered By: [name] on 05/11/2023 08:26:25 Problem List  as needed Off-Loading Other: - keep pressure off of the bottom of your feet. Elevate legs throughout the day. Use the Shoe with the PegAssist off-loading insole Additional Orders / Instructions Follow Nutritious Diet - Try [name] get 70-100 grams of Protein a day+ Other: - 500 mg per day [name] start, then increase [name] 500mg  twice a day. Also consider adding a zinc supplement. Juven Shake 1-2 times daily. Wound Treatment Wound #2 - Calcaneus Wound Laterality: Left Cleanser: Normal Saline (Generic) 1 x Per Day/Other:until healed Discharge Instructions: Cleanse the wound with Normal Saline prior [name] applying a clean dressing using gauze sponges, not tissue or cotton balls. Cleanser: Wound Cleanser (Generic) 1 x Per Day/Other:until healed Discharge Instructions: Cleanse the wound with wound cleanser prior [name]  applying a clean dressing using gauze sponges, not tissue or cotton balls. Peri-Wound Care: Ketoconazole Cream 2% (Generic) 1 x Per Day/Other:until healed Discharge Instructions: Apply Ketoconazole mixed with zinc [name] the periwound Peri-Wound Care: Zinc Oxide Ointment 30g tube (Generic) 1 x Per Day/Other:until healed Discharge Instructions: Apply Zinc Oxide [name] periwound with each dressing change Topical: Gentamicin (Generic) 1 x Per Day/Other:until healed Discharge Instructions: apply [name] the wound bed Prim Dressing: Maxorb Extra Ag+ Alginate Dressing, 4x4.75 (in/in) (Generic) 1 x Per Day/Other:until healed ary Discharge Instructions: Apply [name] wound bed as instructed Secondary Dressing: Drawtex 4x4 in (Generic) 1 x Per Day/Other:until healed Discharge Instructions: Apply over primary dressing as directed. Secondary Dressing: Optifoam Non-Adhesive Dressing, 4x4 in (Generic) 1 x Per Day/Other:until healed Discharge Instructions: Apply over primary dressing as directed. Secondary Dressing: Woven Gauze Sponge, Non-Sterile 4x4 in (Generic) 1 x Per Day/Other:until healed Discharge Instructions: Apply over primary dressing as directed. Secured With: 22M Medipore H Soft Cloth Surgical T ape, 4 x 10 (in/yd) (Generic) 1 x Per Day/Other:until healed Discharge Instructions: Secure with tape as directed. Compression Wrap: Kerlix Roll 4.5x3.1 (in/yd) (Generic) 1 x Per Day/Other:until healed Discharge Instructions: Apply Kerlix and Coban compression as directed. [name], [name] ([id number]) [id number].pdf Page 9 of 19 Add-Ons: Rooke V[employer] Offloading Boot, Size Regular 1 x Per Day/Other:until healed Wound #5 - Foot Wound Laterality: Plantar, Right Topical: Gentamicin 1 x Per Day/30 Days Discharge Instructions: As directed by physician Prim Dressing: Maxorb Extra Ag+ Alginate Dressing, 4x4.75 (in/in) 1 x Per Day/30 Days ary Discharge Instructions: Apply [name] wound bed as  instructed Secondary Dressing: Drawtex 4x4 in 1 x Per Day/30 Days Discharge Instructions: Apply over primary dressing as directed. Secondary Dressing: Optifoam Non-Adhesive Dressing, 4x4 in 1 x Per Day/30 Days Discharge Instructions: Apply over primary dressing as directed. Secured With: [employer], 4.5x3.1 (in/yd) 1 x Per Day/30 Days Discharge Instructions: Secure with Kerlix as directed. Consults Plastic Surgery - Referral [name] plastic surgery. Evaluate for tissue coverage options for bilateral plantar foot ulcers. Electronic Signature(s) Signed: 05/11/2023 12:55:03 PM By: [name] MD FACS Entered By: [name] on 05/11/2023 08:25:08 Prescription 05/11/2023 -------------------------------------------------------------------------------- Kathryne Gin MD Patient Name: Provider: Sep 15, 1975 [id number] Date of Birth: NPI#: [id number] Sex: DEA #: [phone] Phone #: License #: UPN: Patient Address: [address] [facility] [place], [place] [address] [address] [place], [place] [address] [phone] Allergies No Known Drug Allergies Provider's Orders Plastic Surgery - Referral [name] plastic surgery. Evaluate for tissue coverage options for bilateral plantar foot ulcers. Hand Signature: Date(s): Electronic Signature(s) Signed: 05/11/2023 11:26:25 AM By: [name] MD FACS Entered By: [name] on 05/11/2023 08:26:25 Problem List  [name], [name] ([id number]) [id number].pdf Page 1 of 19 Visit Report for 05/11/2023 Chief Complaint Document Details Patient Name: Date of Service: [name], [name] [name] 05/11/2023 10:15 A M Medical Record Number: [id number] Patient Account Number: [id number] Date of Birth/Sex: Treating RN: 29-Nov-1975 (47 y.o. M) Primary Care Provider: Dorinda Hill Other Clinician: Referring Provider: Treating Provider/Extender: Jana Hakim in Treatment: 147 Information Obtained from: Patient Chief Complaint Bilateral Plantar Foot Ulcers Electronic Signature(s) Signed: 05/11/2023 11:21:41 AM By: [name] MD FACS Entered By: [name] on 05/11/2023 08:21:41 -------------------------------------------------------------------------------- Debridement Details Patient Name: Date of Service: [name], [name] [place] E. 05/11/2023 10:15 A M Medical Record Number: [id number] Patient Account Number: [id number] Date of Birth/Sex: Treating RN: 04-14-1976 (47 y.o. [name] Primary Care Provider: Dorinda Hill Other Clinician: Referring Provider: Treating Provider/Extender: Jana Hakim in Treatment: 147 Debridement Performed for Assessment: Wound #2 Left Calcaneus Performed By: Physician [name], MD The following information was scribed by: Brenton Grills The information was scribed for: [name] Debridement Type: Debridement Level of Consciousness (Pre-procedure): Awake and Alert Pre-procedure Verification/Time Out Yes - 10:40 Taken: Start Time: 10:42 Pain Control: Lidocaine 4% Topical Solution Percent of Wound Bed Debrided: 100% T Area Debrided (cm): otal 3.3 Tissue and other material debrided: Slough, Slough Level: Non-Viable Tissue Debridement Description: Selective/Open Wound Instrument: Curette Bleeding: Minimum Hemostasis Achieved: Pressure Response [name] Treatment: Procedure was tolerated well Level of  Consciousness (Post- Awake and Alert procedure): Post Debridement Measurements of Total Wound Length: (cm) 3.5 Stage: Category/Stage III Width: (cm) 1.2 Depth: (cm) 0.1 Volume: (cm) 0.33 Character of Wound/Ulcer Post Debridement: Improved Post Procedure Diagnosis [name], [name] ([id number]) [id number].pdf Page 2 of 19 Same as Pre-procedure Electronic Signature(s) Signed: 05/11/2023 12:55:03 PM By: [name] MD FACS Signed: 05/11/2023 4:35:23 PM By: Brenton Grills Entered By: Brenton Grills on 05/11/2023 07:44:27 -------------------------------------------------------------------------------- Debridement Details Patient Name: Date of Service: [name], [name] [name] 05/11/2023 10:15 A M Medical Record Number: [id number] Patient Account Number: [id number] Date of Birth/Sex: Treating RN: 1976/04/29 (47 y.o. [name] Primary Care Provider: Dorinda Hill Other Clinician: Referring Provider: Treating Provider/Extender: Jana Hakim in Treatment: 147 Debridement Performed for Assessment: Wound #5 Right,Plantar Foot Performed By: Physician [name], MD The following information was scribed by: Brenton Grills The information was scribed for: [name] Debridement Type: Debridement Level of Consciousness (Pre-procedure): Awake and Alert Pre-procedure Verification/Time Out Yes - 10:40 Taken: Start Time: 10:42 Pain Control: Lidocaine 4% T opical Solution Percent of Wound Bed Debrided: 100% T Area Debrided (cm): otal 0.08 Tissue and other material debrided: Callus, Slough, Slough Level: Non-Viable Tissue Debridement Description: Selective/Open Wound Instrument: Curette Bleeding: Minimum Hemostasis Achieved: Pressure Response [name] Treatment: Procedure was tolerated well Level of Consciousness (Post- Awake and Alert procedure): Post Debridement Measurements of Total Wound Length: (cm) 1 Stage: Category/Stage  II Width: (cm) 0.1 Depth: (cm) 0.5 Volume: (cm) 0.039 Character of Wound/Ulcer Post Debridement: Improved Post Procedure Diagnosis Same as Pre-procedure Electronic Signature(s) Signed: 05/11/2023 12:55:03 PM By: [name] MD FACS Signed: 05/11/2023 4:35:23 PM By: Brenton Grills Entered By: Brenton Grills on 05/11/2023 07:45:32 -------------------------------------------------------------------------------- HPI Details Patient Name: Date of Service: [name], [name] [place] E. 05/11/2023 10:15 A M Medical Record Number: [id number] Patient Account Number: [id number] Date of Birth/Sex: Treating RN: 25-Apr-1976 (47 y.o. M) Primary Care Provider: Dorinda Hill Other Clinician: Morton [name] ([id number]) [id number] Page 3 of 19 Referring Provider: Treating Provider/Extender: Jana Hakim in  as needed Off-Loading Other: - keep pressure off of the bottom of your feet. Elevate legs throughout the day. Use the Shoe with the PegAssist off-loading insole Additional Orders / Instructions Follow Nutritious Diet - Try [name] get 70-100 grams of Protein a day+ Other: - 500 mg per day [name] start, then increase [name] 500mg  twice a day. Also consider adding a zinc supplement. Juven Shake 1-2 times daily. Wound Treatment Wound #2 - Calcaneus Wound Laterality: Left Cleanser: Normal Saline (Generic) 1 x Per Day/Other:until healed Discharge Instructions: Cleanse the wound with Normal Saline prior [name] applying a clean dressing using gauze sponges, not tissue or cotton balls. Cleanser: Wound Cleanser (Generic) 1 x Per Day/Other:until healed Discharge Instructions: Cleanse the wound with wound cleanser prior [name]  applying a clean dressing using gauze sponges, not tissue or cotton balls. Peri-Wound Care: Ketoconazole Cream 2% (Generic) 1 x Per Day/Other:until healed Discharge Instructions: Apply Ketoconazole mixed with zinc [name] the periwound Peri-Wound Care: Zinc Oxide Ointment 30g tube (Generic) 1 x Per Day/Other:until healed Discharge Instructions: Apply Zinc Oxide [name] periwound with each dressing change Topical: Gentamicin (Generic) 1 x Per Day/Other:until healed Discharge Instructions: apply [name] the wound bed Prim Dressing: Maxorb Extra Ag+ Alginate Dressing, 4x4.75 (in/in) (Generic) 1 x Per Day/Other:until healed ary Discharge Instructions: Apply [name] wound bed as instructed Secondary Dressing: Drawtex 4x4 in (Generic) 1 x Per Day/Other:until healed Discharge Instructions: Apply over primary dressing as directed. Secondary Dressing: Optifoam Non-Adhesive Dressing, 4x4 in (Generic) 1 x Per Day/Other:until healed Discharge Instructions: Apply over primary dressing as directed. Secondary Dressing: Woven Gauze Sponge, Non-Sterile 4x4 in (Generic) 1 x Per Day/Other:until healed Discharge Instructions: Apply over primary dressing as directed. Secured With: 22M Medipore H Soft Cloth Surgical T ape, 4 x 10 (in/yd) (Generic) 1 x Per Day/Other:until healed Discharge Instructions: Secure with tape as directed. Compression Wrap: Kerlix Roll 4.5x3.1 (in/yd) (Generic) 1 x Per Day/Other:until healed Discharge Instructions: Apply Kerlix and Coban compression as directed. [name], [name] ([id number]) [id number].pdf Page 9 of 19 Add-Ons: [employer] Offloading Boot, Size Regular 1 x Per Day/Other:until healed Wound #5 - Foot Wound Laterality: Plantar, Right Topical: Gentamicin 1 x Per Day/30 Days Discharge Instructions: As directed by physician Prim Dressing: Maxorb Extra Ag+ Alginate Dressing, 4x4.75 (in/in) 1 x Per Day/30 Days ary Discharge Instructions: Apply [name] wound bed as  instructed Secondary Dressing: Drawtex 4x4 in 1 x Per Day/30 Days Discharge Instructions: Apply over primary dressing as directed. Secondary Dressing: Optifoam Non-Adhesive Dressing, 4x4 in 1 x Per Day/30 Days Discharge Instructions: Apply over primary dressing as directed. Secured With: [employer], 4.5x3.1 (in/yd) 1 x Per Day/30 Days Discharge Instructions: Secure with Kerlix as directed. Consults Plastic Surgery - Referral [name] plastic surgery. Evaluate for tissue coverage options for bilateral plantar foot ulcers. Electronic Signature(s) Signed: 05/11/2023 12:55:03 PM By: [name] MD FACS Entered By: [name] on 05/11/2023 08:25:08 Prescription 05/11/2023 -------------------------------------------------------------------------------- Kathryne Gin MD Patient Name: Provider: Sep 15, 1975 [id number] Date of Birth: NPI#: [id number] Sex: DEA #: [phone] Phone #: License #: UPN: Patient Address: [address] [facility] [place], [place] [address] [address] Floor [place], [place] [address] [phone] Allergies No Known Drug Allergies Provider's Orders Plastic Surgery - Referral [name] plastic surgery. Evaluate for tissue coverage options for bilateral plantar foot ulcers. Hand Signature: Date(s): Electronic Signature(s) Signed: 05/11/2023 11:26:25 AM By: [name] MD FACS Entered By: [name] on 05/11/2023 08:26:25 Problem List  of 19 None Hospitalization / Surgery History Type of Hospitalization/Surgery COVID PNA 07/22/2019- 11/14/2019 03/27/2020 wound debridement/ skin graft Family and Social History Cancer: Yes - Maternal Grandparents; Diabetes: Yes - Father,Paternal Grandparents; Heart Disease: Yes - Maternal Grandparents; Hereditary Spherocytosis: No; Hypertension: Yes - Father,Paternal Grandparents; Kidney Disease: No; Lung Disease: Yes - Siblings; Seizures: No; Stroke: No; Thyroid Problems: No; Tuberculosis: No; Never smoker; Marital Status - Married; Alcohol  Use: Never; Drug Use: No History; Caffeine Use: Daily - tea, soda; Financial Concerns: No; Food, Clothing or Shelter Needs: No; Support System Lacking: No; Transportation Concerns: No Electronic Signature(s) Signed: 05/11/2023 12:55:03 PM By: [name] MD FACS Entered By: [name] on 05/11/2023 08:22:31 -------------------------------------------------------------------------------- SuperBill Details Patient Name: Date of Service: [name], [name] [name] 05/11/2023 Medical Record Number: [id number] Patient Account Number: [id number] Date of Birth/Sex: Treating RN: 1976-03-03 (47 y.o. M) Primary Care Provider: Dorinda Hill Other Clinician: Referring Provider: Treating Provider/Extender: Jana Hakim in Treatment: 147 Diagnosis Coding ICD-10 Codes Code Description [phone] Non-pressure chronic ulcer of other part of right foot with fat layer exposed L97.522 Non-pressure chronic ulcer of other part of left foot with fat layer exposed Facility Procedures : CPT4 Code: [id number] Description: 97597 - DEBRIDE WOUND 1ST 20 SQ CM OR < ICD-10 Diagnosis Description L97.512 Non-pressure chronic ulcer of other part of right foot with fat layer exposed L97.522 Non-pressure chronic ulcer of other part of left foot with fat layer exposed Modifier: Quantity: 1 Physician Procedures : CPT4 Code Description Modifier [id number] 99214 - WC PHYS LEVEL 4 - EST PT 25 ICD-10 Diagnosis Description L97.512 Non-pressure chronic ulcer of other part of right foot with fat layer exposed L97.522 Non-pressure chronic ulcer of other part of left foot  with fat layer exposed Quantity: 1 : [id number] 97597 - WC PHYS DEBR WO ANESTH 20 SQ CM ICD-10 Diagnosis Description L97.512 Non-pressure chronic ulcer of other part of right foot with fat layer exposed L97.522 Non-pressure chronic ulcer of other part of left foot with fat layer exposed Quantity: 1 Electronic Signature(s) Signed: 05/11/2023 11:26:18 AM  By: [name] MD FACS Entered By: [name] on 05/11/2023 08:26:18  as needed Off-Loading Other: - keep pressure off of the bottom of your feet. Elevate legs throughout the day. Use the Shoe with the PegAssist off-loading insole Additional Orders / Instructions Follow Nutritious Diet - Try [name] get 70-100 grams of Protein a day+ Other: - 500 mg per day [name] start, then increase [name] 500mg  twice a day. Also consider adding a zinc supplement. Juven Shake 1-2 times daily. Wound Treatment Wound #2 - Calcaneus Wound Laterality: Left Cleanser: Normal Saline (Generic) 1 x Per Day/Other:until healed Discharge Instructions: Cleanse the wound with Normal Saline prior [name] applying a clean dressing using gauze sponges, not tissue or cotton balls. Cleanser: Wound Cleanser (Generic) 1 x Per Day/Other:until healed Discharge Instructions: Cleanse the wound with wound cleanser prior [name]  applying a clean dressing using gauze sponges, not tissue or cotton balls. Peri-Wound Care: Ketoconazole Cream 2% (Generic) 1 x Per Day/Other:until healed Discharge Instructions: Apply Ketoconazole mixed with zinc [name] the periwound Peri-Wound Care: Zinc Oxide Ointment 30g tube (Generic) 1 x Per Day/Other:until healed Discharge Instructions: Apply Zinc Oxide [name] periwound with each dressing change Topical: Gentamicin (Generic) 1 x Per Day/Other:until healed Discharge Instructions: apply [name] the wound bed Prim Dressing: Maxorb Extra Ag+ Alginate Dressing, 4x4.75 (in/in) (Generic) 1 x Per Day/Other:until healed ary Discharge Instructions: Apply [name] wound bed as instructed Secondary Dressing: Drawtex 4x4 in (Generic) 1 x Per Day/Other:until healed Discharge Instructions: Apply over primary dressing as directed. Secondary Dressing: Optifoam Non-Adhesive Dressing, 4x4 in (Generic) 1 x Per Day/Other:until healed Discharge Instructions: Apply over primary dressing as directed. Secondary Dressing: Woven Gauze Sponge, Non-Sterile 4x4 in (Generic) 1 x Per Day/Other:until healed Discharge Instructions: Apply over primary dressing as directed. Secured With: 22M Medipore H Soft Cloth Surgical T ape, 4 x 10 (in/yd) (Generic) 1 x Per Day/Other:until healed Discharge Instructions: Secure with tape as directed. Compression Wrap: Kerlix Roll 4.5x3.1 (in/yd) (Generic) 1 x Per Day/Other:until healed Discharge Instructions: Apply Kerlix and Coban compression as directed. [name], [name] ([id number]) [id number].pdf Page 9 of 19 Add-Ons: Rooke V[employer] Offloading Boot, Size Regular 1 x Per Day/Other:until healed Wound #5 - Foot Wound Laterality: Plantar, Right Topical: Gentamicin 1 x Per Day/30 Days Discharge Instructions: As directed by physician Prim Dressing: Maxorb Extra Ag+ Alginate Dressing, 4x4.75 (in/in) 1 x Per Day/30 Days ary Discharge Instructions: Apply [name] wound bed as  instructed Secondary Dressing: Drawtex 4x4 in 1 x Per Day/30 Days Discharge Instructions: Apply over primary dressing as directed. Secondary Dressing: Optifoam Non-Adhesive Dressing, 4x4 in 1 x Per Day/30 Days Discharge Instructions: Apply over primary dressing as directed. Secured With: [employer], 4.5x3.1 (in/yd) 1 x Per Day/30 Days Discharge Instructions: Secure with Kerlix as directed. Consults Plastic Surgery - Referral [name] plastic surgery. Evaluate for tissue coverage options for bilateral plantar foot ulcers. Electronic Signature(s) Signed: 05/11/2023 12:55:03 PM By: [name] MD FACS Entered By: [name] on 05/11/2023 08:25:08 Prescription 05/11/2023 -------------------------------------------------------------------------------- Kathryne Gin MD Patient Name: Provider: Sep 15, 1975 [id number] Date of Birth: NPI#: [id number] Sex: DEA #: [phone] Phone #: License #: UPN: Patient Address: [address] [facility] [place], [place] [address] [address] [place], [place] [address] [phone] Allergies No Known Drug Allergies Provider's Orders Plastic Surgery - Referral [name] plastic surgery. Evaluate for tissue coverage options for bilateral plantar foot ulcers. Hand Signature: Date(s): Electronic Signature(s) Signed: 05/11/2023 11:26:25 AM By: [name] MD FACS Entered By: [name] on 05/11/2023 08:26:25 Problem List  as needed Off-Loading Other: - keep pressure off of the bottom of your feet. Elevate legs throughout the day. Use the Shoe with the PegAssist off-loading insole Additional Orders / Instructions Follow Nutritious Diet - Try [name] get 70-100 grams of Protein a day+ Other: - 500 mg per day [name] start, then increase [name] 500mg  twice a day. Also consider adding a zinc supplement. Juven Shake 1-2 times daily. Wound Treatment Wound #2 - Calcaneus Wound Laterality: Left Cleanser: Normal Saline (Generic) 1 x Per Day/Other:until healed Discharge Instructions: Cleanse the wound with Normal Saline prior [name] applying a clean dressing using gauze sponges, not tissue or cotton balls. Cleanser: Wound Cleanser (Generic) 1 x Per Day/Other:until healed Discharge Instructions: Cleanse the wound with wound cleanser prior [name]  applying a clean dressing using gauze sponges, not tissue or cotton balls. Peri-Wound Care: Ketoconazole Cream 2% (Generic) 1 x Per Day/Other:until healed Discharge Instructions: Apply Ketoconazole mixed with zinc [name] the periwound Peri-Wound Care: Zinc Oxide Ointment 30g tube (Generic) 1 x Per Day/Other:until healed Discharge Instructions: Apply Zinc Oxide [name] periwound with each dressing change Topical: Gentamicin (Generic) 1 x Per Day/Other:until healed Discharge Instructions: apply [name] the wound bed Prim Dressing: Maxorb Extra Ag+ Alginate Dressing, 4x4.75 (in/in) (Generic) 1 x Per Day/Other:until healed ary Discharge Instructions: Apply [name] wound bed as instructed Secondary Dressing: Drawtex 4x4 in (Generic) 1 x Per Day/Other:until healed Discharge Instructions: Apply over primary dressing as directed. Secondary Dressing: Optifoam Non-Adhesive Dressing, 4x4 in (Generic) 1 x Per Day/Other:until healed Discharge Instructions: Apply over primary dressing as directed. Secondary Dressing: Woven Gauze Sponge, Non-Sterile 4x4 in (Generic) 1 x Per Day/Other:until healed Discharge Instructions: Apply over primary dressing as directed. Secured With: 22M Medipore H Soft Cloth Surgical T ape, 4 x 10 (in/yd) (Generic) 1 x Per Day/Other:until healed Discharge Instructions: Secure with tape as directed. Compression Wrap: Kerlix Roll 4.5x3.1 (in/yd) (Generic) 1 x Per Day/Other:until healed Discharge Instructions: Apply Kerlix and Coban compression as directed. [name], [name] ([id number]) [id number].pdf Page 9 of 19 Add-Ons: Rooke V[employer] Offloading Boot, Size Regular 1 x Per Day/Other:until healed Wound #5 - Foot Wound Laterality: Plantar, Right Topical: Gentamicin 1 x Per Day/30 Days Discharge Instructions: As directed by physician Prim Dressing: Maxorb Extra Ag+ Alginate Dressing, 4x4.75 (in/in) 1 x Per Day/30 Days ary Discharge Instructions: Apply [name] wound bed as  instructed Secondary Dressing: Drawtex 4x4 in 1 x Per Day/30 Days Discharge Instructions: Apply over primary dressing as directed. Secondary Dressing: Optifoam Non-Adhesive Dressing, 4x4 in 1 x Per Day/30 Days Discharge Instructions: Apply over primary dressing as directed. Secured With: [employer], 4.5x3.1 (in/yd) 1 x Per Day/30 Days Discharge Instructions: Secure with Kerlix as directed. Consults Plastic Surgery - Referral [name] plastic surgery. Evaluate for tissue coverage options for bilateral plantar foot ulcers. Electronic Signature(s) Signed: 05/11/2023 12:55:03 PM By: [name] MD FACS Entered By: [name] on 05/11/2023 08:25:08 Prescription 05/11/2023 -------------------------------------------------------------------------------- Kathryne Gin MD Patient Name: Provider: Sep 15, 1975 [id number] Date of Birth: NPI#: [id number] Sex: DEA #: [phone] Phone #: License #: UPN: Patient Address: [address] [facility] [place], [place] [address] [address] [place], [place] [address] [phone] Allergies No Known Drug Allergies Provider's Orders Plastic Surgery - Referral [name] plastic surgery. Evaluate for tissue coverage options for bilateral plantar foot ulcers. Hand Signature: Date(s): Electronic Signature(s) Signed: 05/11/2023 11:26:25 AM By: [name] MD FACS Entered By: [name] on 05/11/2023 08:26:25 Problem List  Details -------------------------------------------------------------------------------- [name], [name] ([id number]) [id number] Page 10 of 19 Patient Name: Date of Service: [name], [name] [name] 05/11/2023 10:15 A M Medical Record Number: [id number] Patient Account Number: [id number] Date of Birth/Sex: Treating RN: [birth year] (47 y.o. [name] Primary Care Provider: Dorinda Hill Other  Clinician: Referring Provider: Treating Provider/Extender: Jana Hakim in Treatment: [phone] Active Problems ICD-10 Encounter Code Description Active Date MDM Diagnosis [id number] Non-pressure chronic ulcer of other part of right foot with fat layer exposed 04/27/2023 No Yes L97.522 Non-pressure chronic ulcer of other part of left foot with fat layer exposed 09/03/2020 No Yes Inactive Problems ICD-10 Code Description Active Date Inactive Date L89.893 Pressure ulcer of other site, stage 3 07/15/2020 07/15/2020 M62.81 Muscle weakness (generalized) 07/15/2020 07/15/2020 I10 Essential (primary) hypertension 07/15/2020 07/15/2020 M86.171 Other acute osteomyelitis, right ankle and foot 09/03/2020 09/03/2020 Resolved Problems Electronic Signature(s) Signed: 05/11/2023 11:21:14 AM By: [name] MD FACS Entered By: [name] on 05/11/2023 08:21:14 -------------------------------------------------------------------------------- Progress Note Details Patient Name: Date of Service: [name], [name] [place] E. 05/11/2023 10:15 A M Medical Record Number: [id number] Patient Account Number: [id number] Date of Birth/Sex: Treating RN: 12-11-1975 (47 y.o. M) Primary Care Provider: Dorinda Hill Other Clinician: Referring Provider: Treating Provider/Extender: Jana Hakim in Treatment: 147 Subjective Chief Complaint Information obtained from Patient Bilateral Plantar Foot Ulcers History of Present Illness (HPI) Wounds are12/03/2020 upon evaluation today patient presents for initial inspection here in our clinic concerning issues he has been having with the bottoms of his feet bilaterally. He states these actually occurred as wounds when he was hospitalized for 5 months secondary [name] Covid. He was apparently with tilting bed where he was in an upright position quite frequently and apparently this occurred in some way shape or form during that time. Fortunately there is  no sign of active infection at this time. No fevers, chills, nausea, vomiting, or diarrhea. With that being said he still has substantial wounds on the plantar aspects of his feet Theragen require quite a bit of work [name] get these [name] heal. He has been using Santyl currently though that is been problematic both in receiving the medication as well as actually paid for it as it is become quite expensive. Prior [name] the experience with Covid the patient really did not have any major medical [name], [name] ([id number]) [id number] Page 11 of 19 problems other than hypertension he does have some mild generalized weakness following the Covid experience. 07/22/2020 on evaluation today patient appears [name] be doing okay in regard [name] his foot ulcers I feel like the wound beds are showing signs of better improvement that I do believe the Iodoflex is helping in this regard. With that being said he does have a lot of drainage currently and this is somewhat blue/green in nature which is consistent with Pseudomonas. I do think a culture today would be appropriate for [place] [name] evaluate and see if that is indeed the case I would likely start him on antibiotic orally as well he is not allergic [name] Cipro knows of no issues he has had in the past 12/21; patient was admitted [name] the clinic earlier this month with bilateral presumed pressure ulcers on the bottom of his feet apparently related [name] excessive pressure from a tilt table arrangement in the intensive care unit. Patient relates this [name] being on ECMO but I am not really sure that is exactly related [name] that. I must say I have never seen anything like  1 x Per Day/Other:until healed Discharge Instructions: Apply over primary dressing as directed. Secondary Dressing: Woven Gauze Sponge, Non-Sterile 4x4 in (Generic) 1 x Per Day/Other:until healed Discharge Instructions: Apply over primary dressing as directed. Secured With: 58M Medipore H Soft Cloth Surgical T ape, 4 x 10 (in/yd) (Generic) 1 x Per Day/Other:until healed Discharge Instructions: Secure with tape as directed. Com pression Wrap: Kerlix Roll 4.5x3.1 (in/yd) (Generic) 1 x Per Day/Other:until healed Discharge Instructions: Apply Kerlix and Coban compression as directed. Add-Ons: Rooke [employer] Offloading Boot, Size Regular 1 x Per Day/Other:until healed WOUND #5: - Foot Wound Laterality: Plantar, Right Topical: Gentamicin 1 x Per Day/30 Days Discharge Instructions: As directed by physician Prim Dressing: Maxorb Extra Ag+ Alginate Dressing, 4x4.75 (in/in) 1 x Per Day/30 Days ary Discharge Instructions: Apply [name] wound bed as instructed Secondary Dressing: Drawtex 4x4 in 1 x Per Day/30 Days Discharge Instructions: Apply over primary dressing as directed. Secondary Dressing: Optifoam Non-Adhesive Dressing, 4x4 in 1 x Per Day/30 Days Discharge Instructions: Apply over primary dressing as directed. Secured With: [employer], 4.5x3.1 (in/yd) 1 x Per Day/30  Days Discharge Instructions: Secure with Kerlix as directed. 05/11/2023: The skin on the right heel is filling in further. There is a little bit of slough accumulation on both surfaces along with periwound callus accumulation. I used a curette [name] debride slough and callus from both wounds. We will continue topical gentamicin with silver alginate and drawtex bilaterally. The patient asked if a plastic surgery referral might be appropriate. Although I am not sure what options they might have [name] offer, we can certainly at least get their input and so a referral was sent today. He will follow-up in 1 week. Electronic Signature(s) Signed: 05/11/2023 11:26:01 AM By: [name] MD FACS Entered By: [name] on 05/11/2023 08:26:01 -------------------------------------------------------------------------------- HxROS Details Patient Name: Date of Service: [name], [name] [place] E. 05/11/2023 10:15 A M Medical Record Number: [id number] Patient Account Number: [id number] Date of Birth/Sex: Treating RN: Dec 21, 1975 (47 y.o. M) Primary Care Provider: Dorinda Hill Other Clinician: Referring Provider: Treating Provider/Extender: Jana Hakim in Treatment: 147 Information Obtained From Patient Constitutional Symptoms (General Health) Medical History: Past Medical History Notes: COVID PNA 07/22/2019-11/14/2019 VENT ECMO, foot drop left foot , Eyes Medical History: Negative for: Cataracts; Glaucoma; Optic Neuritis [name], [name] ([id number]) [id number] Page 18 of 19 Ear/Nose/Mouth/Throat Medical History: Negative for: Chronic sinus problems/congestion; Middle ear problems Hematologic/Lymphatic Medical History: Negative for: Anemia; Hemophilia; Human Immunodeficiency Virus; Lymphedema; Sickle Cell Disease Respiratory Medical History: Positive for: Asthma Negative for: Aspiration; Chronic Obstructive Pulmonary Disease (COPD); Pneumothorax; Sleep  Apnea; Tuberculosis Cardiovascular Medical History: Positive for: Angina - with COVID; Hypertension Negative for: Arrhythmia; Congestive Heart Failure; Coronary Artery Disease; Deep Vein Thrombosis; Hypotension; Myocardial Infarction; Peripheral Arterial Disease; Peripheral Venous Disease; Phlebitis; Vasculitis Gastrointestinal Medical History: Negative for: Cirrhosis ; Colitis; Crohns; Hepatitis A; Hepatitis B; Hepatitis C Endocrine Medical History: Negative for: Type I Diabetes; Type II Diabetes Genitourinary Medical History: Negative for: End Stage Renal Disease Past Medical History Notes: kidney stone Immunological Medical History: Negative for: Lupus Erythematosus; Raynauds; Scleroderma Integumentary (Skin) Medical History: Negative for: History of Burn Musculoskeletal Medical History: Negative for: Gout; Rheumatoid Arthritis; Osteoarthritis; Osteomyelitis Neurologic Medical History: Negative for: Dementia; Neuropathy; Quadriplegia; Paraplegia; Seizure Disorder Oncologic Medical History: Negative for: Received Chemotherapy; Received Radiation Psychiatric Medical History: Negative for: Anorexia/bulimia; Confinement Anxiety Past Medical History Notes: anxiety Immunizations Pneumococcal Vaccine: Received Pneumococcal Vaccination: No Implantable Devices [name], [name] ([id number]) [id number] Page [phone]  surface and minimal eschar around the edges. 03/15/2023: The wound measured narrower today. There is some slough accumulation on the surface and some eschar around the edges. 03/23/2023: The length of the wound remains stable, but it continues [name] narrow. The skin that has covered the posterior aspect of the calcaneus is more robust and has better tensile integrity. Minimal slough and eschar accumulation. 03/30/2023: The wound length has decreased. This seems [name] be primarily due [name] more epithelium covering the posterior aspect of the site. Minimal slough and eschar buildup. 04/07/2023: The wound measured a little bit smaller again today. There is minimal slough accumulation with just a little bit of eschar and callus around the edges. 04/13/2023: The lateral aspect of his wound is starting [name] epithelialize. There is very minimal slough on the surface. Moisture control is good. 04/20/2023: Continued, albeit extremely slow, encroachment of  epithelium as of the wound surface. Minimal slough. Moisture balance is good. 04/27/2023: More epithelium is filling in from the heel area. There is a little bit of slough on the surface with some periwound callus and eschar. He did note a little bit of drainage coming from his right foot and there is a small crack in the skin extending medially from the main site of his prior ulceration. 05/04/2023: The wounds are stable this week. No significant change. There is a little bit of callus buildup around the edges of each site and some thin slough on the surface on the left foot. 05/11/2023: The skin on the right heel is filling in further. There is a little bit of slough accumulation on both surfaces along with periwound callus accumulation. Electronic Signature(s) Signed: 05/11/2023 11:22:23 AM By: [name] MD FACS Entered By: [name] on 05/11/2023 08:22:23 -------------------------------------------------------------------------------- Physical Exam Details Patient Name: Date of Service: [name], [name] [place] E. 05/11/2023 10:15 A M Medical Record Number: [id number] Patient Account Number: [id number] Date of Birth/Sex: Treating RN: October 01, 1975 (47 y.o. M) Primary Care Provider: Dorinda Hill Other Clinician: Referring Provider: Treating Provider/Extender: Jana Hakim in Treatment: 147 Constitutional . . . . no acute distress. Respiratory Normal work of breathing on room air. Notes 05/11/2023: The skin on the right heel is filling in further. There is a little bit of slough accumulation on both surfaces along with periwound callus accumulation. Electronic Signature(s) Signed: 05/11/2023 11:24:38 AM By: [name] MD FACS Entered By: [name] on 05/11/2023 08:24:37 Physician Orders Details -------------------------------------------------------------------------------- [name] ([id number]) [id number] Page 8 of  19 Patient Name: Date of Service: [name], [name] [name] 05/11/2023 10:15 A M Medical Record Number: [id number] Patient Account Number: [id number] Date of Birth/Sex: Treating RN: 08-Aug-1976 (47 y.o. [name] Primary Care Provider: Dorinda Hill Other Clinician: Referring Provider: Treating Provider/Extender: Jana Hakim in Treatment: 147 The following information was scribed by: Brenton Grills The information was scribed for: [name] Verbal / Phone Orders: No Diagnosis Coding ICD-10 Coding Code Description [phone] Non-pressure chronic ulcer of other part of right foot with fat layer exposed L97.522 Non-pressure chronic ulcer of other part of left foot with fat layer exposed Follow-up Appointments ppointment in 1 week. - Dr. Lady Gary Room 3 - Return A Anesthetic Wound #2 Left Calcaneus (In clinic) Topical Lidocaine 4% applied [name] wound bed - In clinic Bathing/ Shower/ Hygiene May shower and wash wound with soap and water. Edema Control - Lymphedema / SCD / Other Bilateral Lower Extremities Avoid standing for long periods of time. Moisturize legs daily. -  Details -------------------------------------------------------------------------------- [name], [name] ([id number]) [id number] Page 10 of 19 Patient Name: Date of Service: [name], [name] [name] 05/11/2023 10:15 A M Medical Record Number: [id number] Patient Account Number: [id number] Date of Birth/Sex: Treating RN: [birth year] (47 y.o. [name] Primary Care Provider: Dorinda Hill Other  Clinician: Referring Provider: Treating Provider/Extender: Jana Hakim in Treatment: [phone] Active Problems ICD-10 Encounter Code Description Active Date MDM Diagnosis [id number] Non-pressure chronic ulcer of other part of right foot with fat layer exposed 04/27/2023 No Yes L97.522 Non-pressure chronic ulcer of other part of left foot with fat layer exposed 09/03/2020 No Yes Inactive Problems ICD-10 Code Description Active Date Inactive Date L89.893 Pressure ulcer of other site, stage 3 07/15/2020 07/15/2020 M62.81 Muscle weakness (generalized) 07/15/2020 07/15/2020 I10 Essential (primary) hypertension 07/15/2020 07/15/2020 M86.171 Other acute osteomyelitis, right ankle and foot 09/03/2020 09/03/2020 Resolved Problems Electronic Signature(s) Signed: 05/11/2023 11:21:14 AM By: [name] MD FACS Entered By: [name] on 05/11/2023 08:21:14 -------------------------------------------------------------------------------- Progress Note Details Patient Name: Date of Service: [name], [name] [place] E. 05/11/2023 10:15 A M Medical Record Number: [id number] Patient Account Number: [id number] Date of Birth/Sex: Treating RN: 12-11-1975 (47 y.o. M) Primary Care Provider: Dorinda Hill Other Clinician: Referring Provider: Treating Provider/Extender: Jana Hakim in Treatment: 147 Subjective Chief Complaint Information obtained from Patient Bilateral Plantar Foot Ulcers History of Present Illness (HPI) Wounds are12/03/2020 upon evaluation today patient presents for initial inspection here in our clinic concerning issues he has been having with the bottoms of his feet bilaterally. He states these actually occurred as wounds when he was hospitalized for 5 months secondary [name] Covid. He was apparently with tilting bed where he was in an upright position quite frequently and apparently this occurred in some way shape or form during that time. Fortunately there is  no sign of active infection at this time. No fevers, chills, nausea, vomiting, or diarrhea. With that being said he still has substantial wounds on the plantar aspects of his feet Theragen require quite a bit of work [name] get these [name] heal. He has been using Santyl currently though that is been problematic both in receiving the medication as well as actually paid for it as it is become quite expensive. Prior [name] the experience with Covid the patient really did not have any major medical [name], [name] ([id number]) [id number] Page 11 of 19 problems other than hypertension he does have some mild generalized weakness following the Covid experience. 07/22/2020 on evaluation today patient appears [name] be doing okay in regard [name] his foot ulcers I feel like the wound beds are showing signs of better improvement that I do believe the Iodoflex is helping in this regard. With that being said he does have a lot of drainage currently and this is somewhat blue/green in nature which is consistent with Pseudomonas. I do think a culture today would be appropriate for [place] [name] evaluate and see if that is indeed the case I would likely start him on antibiotic orally as well he is not allergic [name] Cipro knows of no issues he has had in the past 12/21; patient was admitted [name] the clinic earlier this month with bilateral presumed pressure ulcers on the bottom of his feet apparently related [name] excessive pressure from a tilt table arrangement in the intensive care unit. Patient relates this [name] being on ECMO but I am not really sure that is exactly related [name] that. I must say I have never seen anything like  surface and minimal eschar around the edges. 03/15/2023: The wound measured narrower today. There is some slough accumulation on the surface and some eschar around the edges. 03/23/2023: The length of the wound remains stable, but it continues [name] narrow. The skin that has covered the posterior aspect of the calcaneus is more robust and has better tensile integrity. Minimal slough and eschar accumulation. 03/30/2023: The wound length has decreased. This seems [name] be primarily due [name] more epithelium covering the posterior aspect of the site. Minimal slough and eschar buildup. 04/07/2023: The wound measured a little bit smaller again today. There is minimal slough accumulation with just a little bit of eschar and callus around the edges. 04/13/2023: The lateral aspect of his wound is starting [name] epithelialize. There is very minimal slough on the surface. Moisture control is good. 04/20/2023: Continued, albeit extremely slow, encroachment of  epithelium as of the wound surface. Minimal slough. Moisture balance is good. 04/27/2023: More epithelium is filling in from the heel area. There is a little bit of slough on the surface with some periwound callus and eschar. He did note a little bit of drainage coming from his right foot and there is a small crack in the skin extending medially from the main site of his prior ulceration. 05/04/2023: The wounds are stable this week. No significant change. There is a little bit of callus buildup around the edges of each site and some thin slough on the surface on the left foot. 05/11/2023: The skin on the right heel is filling in further. There is a little bit of slough accumulation on both surfaces along with periwound callus accumulation. Electronic Signature(s) Signed: 05/11/2023 11:22:23 AM By: [name] MD FACS Entered By: [name] on 05/11/2023 08:22:23 -------------------------------------------------------------------------------- Physical Exam Details Patient Name: Date of Service: [name], [name] [place] E. 05/11/2023 10:15 A M Medical Record Number: [id number] Patient Account Number: [id number] Date of Birth/Sex: Treating RN: October 01, 1975 (47 y.o. M) Primary Care Provider: Dorinda Hill Other Clinician: Referring Provider: Treating Provider/Extender: Jana Hakim in Treatment: 147 Constitutional . . . . no acute distress. Respiratory Normal work of breathing on room air. Notes 05/11/2023: The skin on the right heel is filling in further. There is a little bit of slough accumulation on both surfaces along with periwound callus accumulation. Electronic Signature(s) Signed: 05/11/2023 11:24:38 AM By: [name] MD FACS Entered By: [name] on 05/11/2023 08:24:37 Physician Orders Details -------------------------------------------------------------------------------- [name] ([id number]) [id number] Page 8 of  19 Patient Name: Date of Service: [name], [name] [name] 05/11/2023 10:15 A M Medical Record Number: [id number] Patient Account Number: [id number] Date of Birth/Sex: Treating RN: 08-Aug-1976 (47 y.o. [name] Primary Care Provider: Dorinda Hill Other Clinician: Referring Provider: Treating Provider/Extender: Jana Hakim in Treatment: 147 The following information was scribed by: Brenton Grills The information was scribed for: [name] Verbal / Phone Orders: No Diagnosis Coding ICD-10 Coding Code Description [phone] Non-pressure chronic ulcer of other part of right foot with fat layer exposed L97.522 Non-pressure chronic ulcer of other part of left foot with fat layer exposed Follow-up Appointments ppointment in 1 week. - Dr. Lady Gary Room 3 - Return A Anesthetic Wound #2 Left Calcaneus (In clinic) Topical Lidocaine 4% applied [name] wound bed - In clinic Bathing/ Shower/ Hygiene May shower and wash wound with soap and water. Edema Control - Lymphedema / SCD / Other Bilateral Lower Extremities Avoid standing for long periods of time. Moisturize legs daily. -  [name], [name] ([id number]) [id number] Page 1 of 19 Visit Report for 05/11/2023 Chief Complaint Document Details Patient Name: Date of Service: [name], [name] [name] 05/11/2023 10:15 A M Medical Record Number: [id number] Patient Account Number: [id number] Date of Birth/Sex: Treating RN: 29-Nov-1975 (47 y.o. M) Primary Care Provider: Dorinda Hill Other Clinician: Referring Provider: Treating Provider/Extender: Jana Hakim in Treatment: 147 Information Obtained from: Patient Chief Complaint Bilateral Plantar Foot Ulcers Electronic Signature(s) Signed: 05/11/2023 11:21:41 AM By: [name] MD FACS Entered By: [name] on 05/11/2023 08:21:41 -------------------------------------------------------------------------------- Debridement Details Patient Name: Date of Service: [name], [name] [place] E. 05/11/2023 10:15 A M Medical Record Number: [id number] Patient Account Number: [id number] Date of Birth/Sex: Treating RN: 04-14-1976 (47 y.o. [name] Primary Care Provider: Dorinda Hill Other Clinician: Referring Provider: Treating Provider/Extender: Jana Hakim in Treatment: 147 Debridement Performed for Assessment: Wound #2 Left Calcaneus Performed By: Physician [name], MD The following information was scribed by: Brenton Grills The information was scribed for: [name] Debridement Type: Debridement Level of Consciousness (Pre-procedure): Awake and Alert Pre-procedure Verification/Time Out Yes - 10:40 Taken: Start Time: 10:42 Pain Control: Lidocaine 4% Topical Solution Percent of Wound Bed Debrided: 100% T Area Debrided (cm): otal 3.3 Tissue and other material debrided: Slough, Slough Level: Non-Viable Tissue Debridement Description: Selective/Open Wound Instrument: Curette Bleeding: Minimum Hemostasis Achieved: Pressure Response [name] Treatment: Procedure was tolerated well Level of  Consciousness (Post- Awake and Alert procedure): Post Debridement Measurements of Total Wound Length: (cm) 3.5 Stage: Category/Stage III Width: (cm) 1.2 Depth: (cm) 0.1 Volume: (cm) 0.33 Character of Wound/Ulcer Post Debridement: Improved Post Procedure Diagnosis [name], [name] ([id number]) [id number] Page 2 of 19 Same as Pre-procedure Electronic Signature(s) Signed: 05/11/2023 12:55:03 PM By: [name] MD FACS Signed: 05/11/2023 4:35:23 PM By: Brenton Grills Entered By: Brenton Grills on 05/11/2023 07:44:27 -------------------------------------------------------------------------------- Debridement Details Patient Name: Date of Service: [name], [name] [name] 05/11/2023 10:15 A M Medical Record Number: [id number] Patient Account Number: [id number] Date of Birth/Sex: Treating RN: 1976/04/29 (47 y.o. [name] Primary Care Provider: Dorinda Hill Other Clinician: Referring Provider: Treating Provider/Extender: Jana Hakim in Treatment: 147 Debridement Performed for Assessment: Wound #5 Right,Plantar Foot Performed By: Physician [name], MD The following information was scribed by: Brenton Grills The information was scribed for: [name] Debridement Type: Debridement Level of Consciousness (Pre-procedure): Awake and Alert Pre-procedure Verification/Time Out Yes - 10:40 Taken: Start Time: 10:42 Pain Control: Lidocaine 4% T opical Solution Percent of Wound Bed Debrided: 100% T Area Debrided (cm): otal 0.08 Tissue and other material debrided: Callus, Slough, Slough Level: Non-Viable Tissue Debridement Description: Selective/Open Wound Instrument: Curette Bleeding: Minimum Hemostasis Achieved: Pressure Response [name] Treatment: Procedure was tolerated well Level of Consciousness (Post- Awake and Alert procedure): Post Debridement Measurements of Total Wound Length: (cm) 1 Stage: Category/Stage  II Width: (cm) 0.1 Depth: (cm) 0.5 Volume: (cm) 0.039 Character of Wound/Ulcer Post Debridement: Improved Post Procedure Diagnosis Same as Pre-procedure Electronic Signature(s) Signed: 05/11/2023 12:55:03 PM By: [name] MD FACS Signed: 05/11/2023 4:35:23 PM By: Brenton Grills Entered By: Brenton Grills on 05/11/2023 07:45:32 -------------------------------------------------------------------------------- HPI Details Patient Name: Date of Service: [name], [name] [place] E. 05/11/2023 10:15 A M Medical Record Number: [id number] Patient Account Number: [id number] Date of Birth/Sex: Treating RN: 25-Apr-1976 (47 y.o. M) Primary Care Provider: Dorinda Hill Other Clinician: Morton [name] ([id number]) [id number] Page 3 of 19 Referring Provider: Treating Provider/Extender: Jana Hakim in  as needed Off-Loading Other: - keep pressure off of the bottom of your feet. Elevate legs throughout the day. Use the Shoe with the PegAssist off-loading insole Additional Orders / Instructions Follow Nutritious Diet - Try [name] get 70-100 grams of Protein a day+ Other: - 500 mg per day [name] start, then increase [name] 500mg  twice a day. Also consider adding a zinc supplement. Juven Shake 1-2 times daily. Wound Treatment Wound #2 - Calcaneus Wound Laterality: Left Cleanser: Normal Saline (Generic) 1 x Per Day/Other:until healed Discharge Instructions: Cleanse the wound with Normal Saline prior [name] applying a clean dressing using gauze sponges, not tissue or cotton balls. Cleanser: Wound Cleanser (Generic) 1 x Per Day/Other:until healed Discharge Instructions: Cleanse the wound with wound cleanser prior [name]  applying a clean dressing using gauze sponges, not tissue or cotton balls. Peri-Wound Care: Ketoconazole Cream 2% (Generic) 1 x Per Day/Other:until healed Discharge Instructions: Apply Ketoconazole mixed with zinc [name] the periwound Peri-Wound Care: Zinc Oxide Ointment 30g tube (Generic) 1 x Per Day/Other:until healed Discharge Instructions: Apply Zinc Oxide [name] periwound with each dressing change Topical: Gentamicin (Generic) 1 x Per Day/Other:until healed Discharge Instructions: apply [name] the wound bed Prim Dressing: Maxorb Extra Ag+ Alginate Dressing, 4x4.75 (in/in) (Generic) 1 x Per Day/Other:until healed ary Discharge Instructions: Apply [name] wound bed as instructed Secondary Dressing: Drawtex 4x4 in (Generic) 1 x Per Day/Other:until healed Discharge Instructions: Apply over primary dressing as directed. Secondary Dressing: Optifoam Non-Adhesive Dressing, 4x4 in (Generic) 1 x Per Day/Other:until healed Discharge Instructions: Apply over primary dressing as directed. Secondary Dressing: Woven Gauze Sponge, Non-Sterile 4x4 in (Generic) 1 x Per Day/Other:until healed Discharge Instructions: Apply over primary dressing as directed. Secured With: 22M Medipore H Soft Cloth Surgical T ape, 4 x 10 (in/yd) (Generic) 1 x Per Day/Other:until healed Discharge Instructions: Secure with tape as directed. Compression Wrap: Kerlix Roll 4.5x3.1 (in/yd) (Generic) 1 x Per Day/Other:until healed Discharge Instructions: Apply Kerlix and Coban compression as directed. [name], [name] ([id number]) [id number] Page 9 of 19 Add-Ons: [employer] Offloading Boot, Size Regular 1 x Per Day/Other:until healed Wound #5 - Foot Wound Laterality: Plantar, Right Topical: Gentamicin 1 x Per Day/30 Days Discharge Instructions: As directed by physician Prim Dressing: Maxorb Extra Ag+ Alginate Dressing, 4x4.75 (in/in) 1 x Per Day/30 Days ary Discharge Instructions: Apply [name] wound bed as  instructed Secondary Dressing: Drawtex 4x4 in 1 x Per Day/30 Days Discharge Instructions: Apply over primary dressing as directed. Secondary Dressing: Optifoam Non-Adhesive Dressing, 4x4 in 1 x Per Day/30 Days Discharge Instructions: Apply over primary dressing as directed. Secured With: [employer], 4.5x3.1 (in/yd) 1 x Per Day/30 Days Discharge Instructions: Secure with Kerlix as directed. Consults Plastic Surgery - Referral [name] plastic surgery. Evaluate for tissue coverage options for bilateral plantar foot ulcers. Electronic Signature(s) Signed: 05/11/2023 12:55:03 PM By: [name] MD FACS Entered By: [name] on 05/11/2023 08:25:08 Prescription 05/11/2023 -------------------------------------------------------------------------------- Kathryne Gin MD Patient Name: Provider: Sep 15, 1975 [id number] Date of Birth: NPI#: [id number] Sex: DEA #: [phone] Phone #: License #: UPN: Patient Address: [address] [facility] [place], [place] [address] [address] [place], [place] [address] [phone] Allergies No Known Drug Allergies Provider's Orders Plastic Surgery - Referral [name] plastic surgery. Evaluate for tissue coverage options for bilateral plantar foot ulcers. Hand Signature: Date(s): Electronic Signature(s) Signed: 05/11/2023 11:26:25 AM By: [name] MD FACS Entered By: [name] on 05/11/2023 08:26:25 Problem List  surface and minimal eschar around the edges. 03/15/2023: The wound measured narrower today. There is some slough accumulation on the surface and some eschar around the edges. 03/23/2023: The length of the wound remains stable, but it continues [name] narrow. The skin that has covered the posterior aspect of the calcaneus is more robust and has better tensile integrity. Minimal slough and eschar accumulation. 03/30/2023: The wound length has decreased. This seems [name] be primarily due [name] more epithelium covering the posterior aspect of the site. Minimal slough and eschar buildup. 04/07/2023: The wound measured a little bit smaller again today. There is minimal slough accumulation with just a little bit of eschar and callus around the edges. 04/13/2023: The lateral aspect of his wound is starting [name] epithelialize. There is very minimal slough on the surface. Moisture control is good. 04/20/2023: Continued, albeit extremely slow, encroachment of  epithelium as of the wound surface. Minimal slough. Moisture balance is good. 04/27/2023: More epithelium is filling in from the heel area. There is a little bit of slough on the surface with some periwound callus and eschar. He did note a little bit of drainage coming from his right foot and there is a small crack in the skin extending medially from the main site of his prior ulceration. 05/04/2023: The wounds are stable this week. No significant change. There is a little bit of callus buildup around the edges of each site and some thin slough on the surface on the left foot. 05/11/2023: The skin on the right heel is filling in further. There is a little bit of slough accumulation on both surfaces along with periwound callus accumulation. Electronic Signature(s) Signed: 05/11/2023 11:22:23 AM By: [name] MD FACS Entered By: [name] on 05/11/2023 08:22:23 -------------------------------------------------------------------------------- Physical Exam Details Patient Name: Date of Service: [name], [name] [place] E. 05/11/2023 10:15 A M Medical Record Number: [id number] Patient Account Number: [id number] Date of Birth/Sex: Treating RN: October 01, 1975 (47 y.o. M) Primary Care Provider: Dorinda Hill Other Clinician: Referring Provider: Treating Provider/Extender: Jana Hakim in Treatment: 147 Constitutional . . . . no acute distress. Respiratory Normal work of breathing on room air. Notes 05/11/2023: The skin on the right heel is filling in further. There is a little bit of slough accumulation on both surfaces along with periwound callus accumulation. Electronic Signature(s) Signed: 05/11/2023 11:24:38 AM By: [name] MD FACS Entered By: [name] on 05/11/2023 08:24:37 Physician Orders Details -------------------------------------------------------------------------------- [name] ([id number]) [id number] Page 8 of  19 Patient Name: Date of Service: [name], [name] [name] 05/11/2023 10:15 A M Medical Record Number: [id number] Patient Account Number: [id number] Date of Birth/Sex: Treating RN: 08-Aug-1976 (47 y.o. [name] Primary Care Provider: Dorinda Hill Other Clinician: Referring Provider: Treating Provider/Extender: Jana Hakim in Treatment: 147 The following information was scribed by: Brenton Grills The information was scribed for: [name] Verbal / Phone Orders: No Diagnosis Coding ICD-10 Coding Code Description [phone] Non-pressure chronic ulcer of other part of right foot with fat layer exposed L97.522 Non-pressure chronic ulcer of other part of left foot with fat layer exposed Follow-up Appointments ppointment in 1 week. - Dr. Lady Gary Room 3 - Return A Anesthetic Wound #2 Left Calcaneus (In clinic) Topical Lidocaine 4% applied [name] wound bed - In clinic Bathing/ Shower/ Hygiene May shower and wash wound with soap and water. Edema Control - Lymphedema / SCD / Other Bilateral Lower Extremities Avoid standing for long periods of time. Moisturize legs daily. -  Details -------------------------------------------------------------------------------- [name], [name] ([id number]) [id number] Page 10 of 19 Patient Name: Date of Service: [name], [name] [name] 05/11/2023 10:15 A M Medical Record Number: [id number] Patient Account Number: [id number] Date of Birth/Sex: Treating RN: [birth year] (47 y.o. [name] Primary Care Provider: Dorinda Hill Other  Clinician: Referring Provider: Treating Provider/Extender: Jana Hakim in Treatment: [phone] Active Problems ICD-10 Encounter Code Description Active Date MDM Diagnosis [id number] Non-pressure chronic ulcer of other part of right foot with fat layer exposed 04/27/2023 No Yes L97.522 Non-pressure chronic ulcer of other part of left foot with fat layer exposed 09/03/2020 No Yes Inactive Problems ICD-10 Code Description Active Date Inactive Date L89.893 Pressure ulcer of other site, stage 3 07/15/2020 07/15/2020 M62.81 Muscle weakness (generalized) 07/15/2020 07/15/2020 I10 Essential (primary) hypertension 07/15/2020 07/15/2020 M86.171 Other acute osteomyelitis, right ankle and foot 09/03/2020 09/03/2020 Resolved Problems Electronic Signature(s) Signed: 05/11/2023 11:21:14 AM By: [name] MD FACS Entered By: [name] on 05/11/2023 08:21:14 -------------------------------------------------------------------------------- Progress Note Details Patient Name: Date of Service: [name], [name] [place] E. 05/11/2023 10:15 A M Medical Record Number: [id number] Patient Account Number: [id number] Date of Birth/Sex: Treating RN: 12-11-1975 (47 y.o. M) Primary Care Provider: Dorinda Hill Other Clinician: Referring Provider: Treating Provider/Extender: Jana Hakim in Treatment: 147 Subjective Chief Complaint Information obtained from Patient Bilateral Plantar Foot Ulcers History of Present Illness (HPI) Wounds are12/03/2020 upon evaluation today patient presents for initial inspection here in our clinic concerning issues he has been having with the bottoms of his feet bilaterally. He states these actually occurred as wounds when he was hospitalized for 5 months secondary [name] Covid. He was apparently with tilting bed where he was in an upright position quite frequently and apparently this occurred in some way shape or form during that time. Fortunately there is  no sign of active infection at this time. No fevers, chills, nausea, vomiting, or diarrhea. With that being said he still has substantial wounds on the plantar aspects of his feet Theragen require quite a bit of work [name] get these [name] heal. He has been using Santyl currently though that is been problematic both in receiving the medication as well as actually paid for it as it is become quite expensive. Prior [name] the experience with Covid the patient really did not have any major medical [name], [name] ([id number]) [id number] Page 11 of 19 problems other than hypertension he does have some mild generalized weakness following the Covid experience. 07/22/2020 on evaluation today patient appears [name] be doing okay in regard [name] his foot ulcers I feel like the wound beds are showing signs of better improvement that I do believe the Iodoflex is helping in this regard. With that being said he does have a lot of drainage currently and this is somewhat blue/green in nature which is consistent with Pseudomonas. I do think a culture today would be appropriate for [place] [name] evaluate and see if that is indeed the case I would likely start him on antibiotic orally as well he is not allergic [name] Cipro knows of no issues he has had in the past 12/21; patient was admitted [name] the clinic earlier this month with bilateral presumed pressure ulcers on the bottom of his feet apparently related [name] excessive pressure from a tilt table arrangement in the intensive care unit. Patient relates this [name] being on ECMO but I am not really sure that is exactly related [name] that. I must say I have never seen anything like  Details -------------------------------------------------------------------------------- [name], [name] ([id number]) [id number] Page 10 of 19 Patient Name: Date of Service: [name], [name] [name] 05/11/2023 10:15 A M Medical Record Number: [id number] Patient Account Number: [id number] Date of Birth/Sex: Treating RN: [birth year] (47 y.o. [name] Primary Care Provider: Dorinda Hill Other  Clinician: Referring Provider: Treating Provider/Extender: Jana Hakim in Treatment: [phone] Active Problems ICD-10 Encounter Code Description Active Date MDM Diagnosis [id number] Non-pressure chronic ulcer of other part of right foot with fat layer exposed 04/27/2023 No Yes L97.522 Non-pressure chronic ulcer of other part of left foot with fat layer exposed 09/03/2020 No Yes Inactive Problems ICD-10 Code Description Active Date Inactive Date L89.893 Pressure ulcer of other site, stage 3 07/15/2020 07/15/2020 M62.81 Muscle weakness (generalized) 07/15/2020 07/15/2020 I10 Essential (primary) hypertension 07/15/2020 07/15/2020 M86.171 Other acute osteomyelitis, right ankle and foot 09/03/2020 09/03/2020 Resolved Problems Electronic Signature(s) Signed: 05/11/2023 11:21:14 AM By: [name] MD FACS Entered By: [name] on 05/11/2023 08:21:14 -------------------------------------------------------------------------------- Progress Note Details Patient Name: Date of Service: [name], [name] [place] E. 05/11/2023 10:15 A M Medical Record Number: [id number] Patient Account Number: [id number] Date of Birth/Sex: Treating RN: 12-11-1975 (47 y.o. M) Primary Care Provider: Dorinda Hill Other Clinician: Referring Provider: Treating Provider/Extender: Jana Hakim in Treatment: 147 Subjective Chief Complaint Information obtained from Patient Bilateral Plantar Foot Ulcers History of Present Illness (HPI) Wounds are12/03/2020 upon evaluation today patient presents for initial inspection here in our clinic concerning issues he has been having with the bottoms of his feet bilaterally. He states these actually occurred as wounds when he was hospitalized for 5 months secondary [name] Covid. He was apparently with tilting bed where he was in an upright position quite frequently and apparently this occurred in some way shape or form during that time. Fortunately there is  no sign of active infection at this time. No fevers, chills, nausea, vomiting, or diarrhea. With that being said he still has substantial wounds on the plantar aspects of his feet Theragen require quite a bit of work [name] get these [name] heal. He has been using Santyl currently though that is been problematic both in receiving the medication as well as actually paid for it as it is become quite expensive. Prior [name] the experience with Covid the patient really did not have any major medical [name], [name] ([id number]) [id number] Page 11 of 19 problems other than hypertension he does have some mild generalized weakness following the Covid experience. 07/22/2020 on evaluation today patient appears [name] be doing okay in regard [name] his foot ulcers I feel like the wound beds are showing signs of better improvement that I do believe the Iodoflex is helping in this regard. With that being said he does have a lot of drainage currently and this is somewhat blue/green in nature which is consistent with Pseudomonas. I do think a culture today would be appropriate for [place] [name] evaluate and see if that is indeed the case I would likely start him on antibiotic orally as well he is not allergic [name] Cipro knows of no issues he has had in the past 12/21; patient was admitted [name] the clinic earlier this month with bilateral presumed pressure ulcers on the bottom of his feet apparently related [name] excessive pressure from a tilt table arrangement in the intensive care unit. Patient relates this [name] being on ECMO but I am not really sure that is exactly related [name] that. I must say I have never seen anything like  as needed Off-Loading Other: - keep pressure off of the bottom of your feet. Elevate legs throughout the day. Use the Shoe with the PegAssist off-loading insole Additional Orders / Instructions Follow Nutritious Diet - Try [name] get 70-100 grams of Protein a day+ Other: - 500 mg per day [name] start, then increase [name] 500mg  twice a day. Also consider adding a zinc supplement. Juven Shake 1-2 times daily. Wound Treatment Wound #2 - Calcaneus Wound Laterality: Left Cleanser: Normal Saline (Generic) 1 x Per Day/Other:until healed Discharge Instructions: Cleanse the wound with Normal Saline prior [name] applying a clean dressing using gauze sponges, not tissue or cotton balls. Cleanser: Wound Cleanser (Generic) 1 x Per Day/Other:until healed Discharge Instructions: Cleanse the wound with wound cleanser prior [name]  applying a clean dressing using gauze sponges, not tissue or cotton balls. Peri-Wound Care: Ketoconazole Cream 2% (Generic) 1 x Per Day/Other:until healed Discharge Instructions: Apply Ketoconazole mixed with zinc [name] the periwound Peri-Wound Care: Zinc Oxide Ointment 30g tube (Generic) 1 x Per Day/Other:until healed Discharge Instructions: Apply Zinc Oxide [name] periwound with each dressing change Topical: Gentamicin (Generic) 1 x Per Day/Other:until healed Discharge Instructions: apply [name] the wound bed Prim Dressing: Maxorb Extra Ag+ Alginate Dressing, 4x4.75 (in/in) (Generic) 1 x Per Day/Other:until healed ary Discharge Instructions: Apply [name] wound bed as instructed Secondary Dressing: Drawtex 4x4 in (Generic) 1 x Per Day/Other:until healed Discharge Instructions: Apply over primary dressing as directed. Secondary Dressing: Optifoam Non-Adhesive Dressing, 4x4 in (Generic) 1 x Per Day/Other:until healed Discharge Instructions: Apply over primary dressing as directed. Secondary Dressing: Woven Gauze Sponge, Non-Sterile 4x4 in (Generic) 1 x Per Day/Other:until healed Discharge Instructions: Apply over primary dressing as directed. Secured With: 22M Medipore H Soft Cloth Surgical T ape, 4 x 10 (in/yd) (Generic) 1 x Per Day/Other:until healed Discharge Instructions: Secure with tape as directed. Compression Wrap: Kerlix Roll 4.5x3.1 (in/yd) (Generic) 1 x Per Day/Other:until healed Discharge Instructions: Apply Kerlix and Coban compression as directed. [name], [name] ([id number]) [id number] Page 9 of 19 Add-Ons: [employer] Offloading Boot, Size Regular 1 x Per Day/Other:until healed Wound #5 - Foot Wound Laterality: Plantar, Right Topical: Gentamicin 1 x Per Day/30 Days Discharge Instructions: As directed by physician Prim Dressing: Maxorb Extra Ag+ Alginate Dressing, 4x4.75 (in/in) 1 x Per Day/30 Days ary Discharge Instructions: Apply [name] wound bed as  instructed Secondary Dressing: Drawtex 4x4 in 1 x Per Day/30 Days Discharge Instructions: Apply over primary dressing as directed. Secondary Dressing: Optifoam Non-Adhesive Dressing, 4x4 in 1 x Per Day/30 Days Discharge Instructions: Apply over primary dressing as directed. Secured With: [employer], 4.5x3.1 (in/yd) 1 x Per Day/30 Days Discharge Instructions: Secure with Kerlix as directed. Consults Plastic Surgery - Referral [name] plastic surgery. Evaluate for tissue coverage options for bilateral plantar foot ulcers. Electronic Signature(s) Signed: 05/11/2023 12:55:03 PM By: [name] MD FACS Entered By: [name] on 05/11/2023 08:25:08 Prescription 05/11/2023 -------------------------------------------------------------------------------- Kathryne Gin MD Patient Name: Provider: Sep 15, 1975 [id number] Date of Birth: NPI#: [id number] Sex: DEA #: [phone] Phone #: License #: UPN: Patient Address: [address] [facility] [place], [place] [address] [address] [place], [place] [address] [phone] Allergies No Known Drug Allergies Provider's Orders Plastic Surgery - Referral [name] plastic surgery. Evaluate for tissue coverage options for bilateral plantar foot ulcers. Hand Signature: Date(s): Electronic Signature(s) Signed: 05/11/2023 11:26:25 AM By: [name] MD FACS Entered By: [name] on 05/11/2023 08:26:25 Problem List  as needed Off-Loading Other: - keep pressure off of the bottom of your feet. Elevate legs throughout the day. Use the Shoe with the PegAssist off-loading insole Additional Orders / Instructions Follow Nutritious Diet - Try [name] get 70-100 grams of Protein a day+ Other: - 500 mg per day [name] start, then increase [name] 500mg  twice a day. Also consider adding a zinc supplement. Juven Shake 1-2 times daily. Wound Treatment Wound #2 - Calcaneus Wound Laterality: Left Cleanser: Normal Saline (Generic) 1 x Per Day/Other:until healed Discharge Instructions: Cleanse the wound with Normal Saline prior [name] applying a clean dressing using gauze sponges, not tissue or cotton balls. Cleanser: Wound Cleanser (Generic) 1 x Per Day/Other:until healed Discharge Instructions: Cleanse the wound with wound cleanser prior [name]  applying a clean dressing using gauze sponges, not tissue or cotton balls. Peri-Wound Care: Ketoconazole Cream 2% (Generic) 1 x Per Day/Other:until healed Discharge Instructions: Apply Ketoconazole mixed with zinc [name] the periwound Peri-Wound Care: Zinc Oxide Ointment 30g tube (Generic) 1 x Per Day/Other:until healed Discharge Instructions: Apply Zinc Oxide [name] periwound with each dressing change Topical: Gentamicin (Generic) 1 x Per Day/Other:until healed Discharge Instructions: apply [name] the wound bed Prim Dressing: Maxorb Extra Ag+ Alginate Dressing, 4x4.75 (in/in) (Generic) 1 x Per Day/Other:until healed ary Discharge Instructions: Apply [name] wound bed as instructed Secondary Dressing: Drawtex 4x4 in (Generic) 1 x Per Day/Other:until healed Discharge Instructions: Apply over primary dressing as directed. Secondary Dressing: Optifoam Non-Adhesive Dressing, 4x4 in (Generic) 1 x Per Day/Other:until healed Discharge Instructions: Apply over primary dressing as directed. Secondary Dressing: Woven Gauze Sponge, Non-Sterile 4x4 in (Generic) 1 x Per Day/Other:until healed Discharge Instructions: Apply over primary dressing as directed. Secured With: 22M Medipore H Soft Cloth Surgical T ape, 4 x 10 (in/yd) (Generic) 1 x Per Day/Other:until healed Discharge Instructions: Secure with tape as directed. Compression Wrap: Kerlix Roll 4.5x3.1 (in/yd) (Generic) 1 x Per Day/Other:until healed Discharge Instructions: Apply Kerlix and Coban compression as directed. [name], [name] ([id number]) [id number] Page 9 of 19 Add-Ons: [employer] Offloading Boot, Size Regular 1 x Per Day/Other:until healed Wound #5 - Foot Wound Laterality: Plantar, Right Topical: Gentamicin 1 x Per Day/30 Days Discharge Instructions: As directed by physician Prim Dressing: Maxorb Extra Ag+ Alginate Dressing, 4x4.75 (in/in) 1 x Per Day/30 Days ary Discharge Instructions: Apply [name] wound bed as  instructed Secondary Dressing: Drawtex 4x4 in 1 x Per Day/30 Days Discharge Instructions: Apply over primary dressing as directed. Secondary Dressing: Optifoam Non-Adhesive Dressing, 4x4 in 1 x Per Day/30 Days Discharge Instructions: Apply over primary dressing as directed. Secured With: [employer], 4.5x3.1 (in/yd) 1 x Per Day/30 Days Discharge Instructions: Secure with Kerlix as directed. Consults Plastic Surgery - Referral [name] plastic surgery. Evaluate for tissue coverage options for bilateral plantar foot ulcers. Electronic Signature(s) Signed: 05/11/2023 12:55:03 PM By: [name] MD FACS Entered By: [name] on 05/11/2023 08:25:08 Prescription 05/11/2023 -------------------------------------------------------------------------------- Kathryne Gin MD Patient Name: Provider: Sep 15, 1975 [id number] Date of Birth: NPI#: [id number] Sex: DEA #: [phone] Phone #: License #: UPN: Patient Address: [address] [facility] [place], [place] [address] [address] [place], [place] [address] [phone] Allergies No Known Drug Allergies Provider's Orders Plastic Surgery - Referral [name] plastic surgery. Evaluate for tissue coverage options for bilateral plantar foot ulcers. Hand Signature: Date(s): Electronic Signature(s) Signed: 05/11/2023 11:26:25 AM By: [name] MD FACS Entered By: [name] on 05/11/2023 08:26:25 Problem List  [name], [name] ([id number]) [id number] Page 1 of 19 Visit Report for 05/11/2023 Chief Complaint Document Details Patient Name: Date of Service: [name], [name] [name] 05/11/2023 10:15 A M Medical Record Number: [id number] Patient Account Number: [id number] Date of Birth/Sex: Treating RN: 29-Nov-1975 (47 y.o. M) Primary Care Provider: Dorinda Hill Other Clinician: Referring Provider: Treating Provider/Extender: Jana Hakim in Treatment: 147 Information Obtained from: Patient Chief Complaint Bilateral Plantar Foot Ulcers Electronic Signature(s) Signed: 05/11/2023 11:21:41 AM By: [name] MD FACS Entered By: [name] on 05/11/2023 08:21:41 -------------------------------------------------------------------------------- Debridement Details Patient Name: Date of Service: [name], [name] [place] E. 05/11/2023 10:15 A M Medical Record Number: [id number] Patient Account Number: [id number] Date of Birth/Sex: Treating RN: 04-14-1976 (47 y.o. [name] Primary Care Provider: Dorinda Hill Other Clinician: Referring Provider: Treating Provider/Extender: Jana Hakim in Treatment: 147 Debridement Performed for Assessment: Wound #2 Left Calcaneus Performed By: Physician [name], MD The following information was scribed by: Brenton Grills The information was scribed for: [name] Debridement Type: Debridement Level of Consciousness (Pre-procedure): Awake and Alert Pre-procedure Verification/Time Out Yes - 10:40 Taken: Start Time: 10:42 Pain Control: Lidocaine 4% Topical Solution Percent of Wound Bed Debrided: 100% T Area Debrided (cm): otal 3.3 Tissue and other material debrided: Slough, Slough Level: Non-Viable Tissue Debridement Description: Selective/Open Wound Instrument: Curette Bleeding: Minimum Hemostasis Achieved: Pressure Response [name] Treatment: Procedure was tolerated well Level of  Consciousness (Post- Awake and Alert procedure): Post Debridement Measurements of Total Wound Length: (cm) 3.5 Stage: Category/Stage III Width: (cm) 1.2 Depth: (cm) 0.1 Volume: (cm) 0.33 Character of Wound/Ulcer Post Debridement: Improved Post Procedure Diagnosis [name], [name] ([id number]) [id number] Page 2 of 19 Same as Pre-procedure Electronic Signature(s) Signed: 05/11/2023 12:55:03 PM By: [name] MD FACS Signed: 05/11/2023 4:35:23 PM By: Brenton Grills Entered By: Brenton Grills on 05/11/2023 07:44:27 -------------------------------------------------------------------------------- Debridement Details Patient Name: Date of Service: [name], [name] [name] 05/11/2023 10:15 A M Medical Record Number: [id number] Patient Account Number: [id number] Date of Birth/Sex: Treating RN: 1976/04/29 (47 y.o. [name] Primary Care Provider: Dorinda Hill Other Clinician: Referring Provider: Treating Provider/Extender: Jana Hakim in Treatment: 147 Debridement Performed for Assessment: Wound #5 Right,Plantar Foot Performed By: Physician [name], MD The following information was scribed by: Brenton Grills The information was scribed for: [name] Debridement Type: Debridement Level of Consciousness (Pre-procedure): Awake and Alert Pre-procedure Verification/Time Out Yes - 10:40 Taken: Start Time: 10:42 Pain Control: Lidocaine 4% T opical Solution Percent of Wound Bed Debrided: 100% T Area Debrided (cm): otal 0.08 Tissue and other material debrided: Callus, Slough, Slough Level: Non-Viable Tissue Debridement Description: Selective/Open Wound Instrument: Curette Bleeding: Minimum Hemostasis Achieved: Pressure Response [name] Treatment: Procedure was tolerated well Level of Consciousness (Post- Awake and Alert procedure): Post Debridement Measurements of Total Wound Length: (cm) 1 Stage: Category/Stage  II Width: (cm) 0.1 Depth: (cm) 0.5 Volume: (cm) 0.039 Character of Wound/Ulcer Post Debridement: Improved Post Procedure Diagnosis Same as Pre-procedure Electronic Signature(s) Signed: 05/11/2023 12:55:03 PM By: [name] MD FACS Signed: 05/11/2023 4:35:23 PM By: Brenton Grills Entered By: Brenton Grills on 05/11/2023 07:45:32 -------------------------------------------------------------------------------- HPI Details Patient Name: Date of Service: [name], [name] [place] E. 05/11/2023 10:15 A M Medical Record Number: [id number] Patient Account Number: [id number] Date of Birth/Sex: Treating RN: 25-Apr-1976 (47 y.o. M) Primary Care Provider: Dorinda Hill Other Clinician: Morton [name] ([id number]) [id number] Page 3 of 19 Referring Provider: Treating Provider/Extender: Jana Hakim in  1 x Per Day/Other:until healed Discharge Instructions: Apply over primary dressing as directed. Secondary Dressing: Woven Gauze Sponge, Non-Sterile 4x4 in (Generic) 1 x Per Day/Other:until healed Discharge Instructions: Apply over primary dressing as directed. Secured With: 58M Medipore H Soft Cloth Surgical T ape, 4 x 10 (in/yd) (Generic) 1 x Per Day/Other:until healed Discharge Instructions: Secure with tape as directed. Com pression Wrap: Kerlix Roll 4.5x3.1 (in/yd) (Generic) 1 x Per Day/Other:until healed Discharge Instructions: Apply Kerlix and Coban compression as directed. Add-Ons: [employer] Offloading Boot, Size Regular 1 x Per Day/Other:until healed WOUND #5: - Foot Wound Laterality: Plantar, Right Topical: Gentamicin 1 x Per Day/30 Days Discharge Instructions: As directed by physician Prim Dressing: Maxorb Extra Ag+ Alginate Dressing, 4x4.75 (in/in) 1 x Per Day/30 Days ary Discharge Instructions: Apply [name] wound bed as instructed Secondary Dressing: Drawtex 4x4 in 1 x Per Day/30 Days Discharge Instructions: Apply over primary dressing as directed. Secondary Dressing: Optifoam Non-Adhesive Dressing, 4x4 in 1 x Per Day/30 Days Discharge Instructions: Apply over primary dressing as directed. Secured With: [employer], 4.5x3.1 (in/yd) 1 x Per Day/30  Days Discharge Instructions: Secure with Kerlix as directed. 05/11/2023: The skin on the right heel is filling in further. There is a little bit of slough accumulation on both surfaces along with periwound callus accumulation. I used a curette [name] debride slough and callus from both wounds. We will continue topical gentamicin with silver alginate and drawtex bilaterally. The patient asked if a plastic surgery referral might be appropriate. Although I am not sure what options they might have [name] offer, we can certainly at least get their input and so a referral was sent today. He will follow-up in 1 week. Electronic Signature(s) Signed: 05/11/2023 11:26:01 AM By: [name] MD FACS Entered By: [name] on 05/11/2023 08:26:01 -------------------------------------------------------------------------------- HxROS Details Patient Name: Date of Service: [name], [name] [place] E. 05/11/2023 10:15 A M Medical Record Number: [id number] Patient Account Number: [id number] Date of Birth/Sex: Treating RN: Dec 21, 1975 (47 y.o. M) Primary Care Provider: Dorinda Hill Other Clinician: Referring Provider: Treating Provider/Extender: Jana Hakim in Treatment: 147 Information Obtained From Patient Constitutional Symptoms (General Health) Medical History: Past Medical History Notes: COVID PNA 07/22/2019-11/14/2019 [facility] ECMO, foot drop left foot , Eyes Medical History: Negative for: Cataracts; Glaucoma; Optic Neuritis [name], [name] ([id number]) [id number] Page 18 of 19 Ear/Nose/Mouth/Throat Medical History: Negative for: Chronic sinus problems/congestion; Middle ear problems Hematologic/Lymphatic Medical History: Negative for: Anemia; Hemophilia; Human Immunodeficiency Virus; Lymphedema; Sickle Cell Disease Respiratory Medical History: Positive for: Asthma Negative for: Aspiration; Chronic Obstructive Pulmonary Disease (COPD); Pneumothorax; Sleep  Apnea; Tuberculosis Cardiovascular Medical History: Positive for: Angina - with COVID; Hypertension Negative for: Arrhythmia; Congestive Heart Failure; Coronary Artery Disease; Deep Vein Thrombosis; Hypotension; Myocardial Infarction; Peripheral Arterial Disease; Peripheral Venous Disease; Phlebitis; Vasculitis Gastrointestinal Medical History: Negative for: Cirrhosis ; Colitis; Crohns; Hepatitis A; Hepatitis B; Hepatitis C Endocrine Medical History: Negative for: Type I Diabetes; Type II Diabetes Genitourinary Medical History: Negative for: End Stage Renal Disease Past Medical History Notes: kidney stone Immunological Medical History: Negative for: Lupus Erythematosus; Raynauds; Scleroderma Integumentary (Skin) Medical History: Negative for: History of Burn Musculoskeletal Medical History: Negative for: Gout; Rheumatoid Arthritis; Osteoarthritis; Osteomyelitis Neurologic Medical History: Negative for: Dementia; Neuropathy; Quadriplegia; Paraplegia; Seizure Disorder Oncologic Medical History: Negative for: Received Chemotherapy; Received Radiation Psychiatric Medical History: Negative for: Anorexia/bulimia; Confinement Anxiety Past Medical History Notes: anxiety Immunizations Pneumococcal Vaccine: Received Pneumococcal Vaccination: No Implantable Devices [name], [name] ([id number]) [id number] Page [phone]  Details -------------------------------------------------------------------------------- [name], [name] ([id number]) [id number] Page 10 of 19 Patient Name: Date of Service: [name], [name] [name] 05/11/2023 10:15 A M Medical Record Number: [id number] Patient Account Number: [id number] Date of Birth/Sex: Treating RN: [birth year] (47 y.o. [name] Primary Care Provider: Dorinda Hill Other  Clinician: Referring Provider: Treating Provider/Extender: Jana Hakim in Treatment: [phone] Active Problems ICD-10 Encounter Code Description Active Date MDM Diagnosis [id number] Non-pressure chronic ulcer of other part of right foot with fat layer exposed 04/27/2023 No Yes L97.522 Non-pressure chronic ulcer of other part of left foot with fat layer exposed 09/03/2020 No Yes Inactive Problems ICD-10 Code Description Active Date Inactive Date L89.893 Pressure ulcer of other site, stage 3 07/15/2020 07/15/2020 M62.81 Muscle weakness (generalized) 07/15/2020 07/15/2020 I10 Essential (primary) hypertension 07/15/2020 07/15/2020 M86.171 Other acute osteomyelitis, right ankle and foot 09/03/2020 09/03/2020 Resolved Problems Electronic Signature(s) Signed: 05/11/2023 11:21:14 AM By: [name] MD FACS Entered By: [name] on 05/11/2023 08:21:14 -------------------------------------------------------------------------------- Progress Note Details Patient Name: Date of Service: [name], [name] [place] E. 05/11/2023 10:15 A M Medical Record Number: [id number] Patient Account Number: [id number] Date of Birth/Sex: Treating RN: 12-11-1975 (47 y.o. M) Primary Care Provider: Dorinda Hill Other Clinician: Referring Provider: Treating Provider/Extender: Jana Hakim in Treatment: 147 Subjective Chief Complaint Information obtained from Patient Bilateral Plantar Foot Ulcers History of Present Illness (HPI) Wounds are12/03/2020 upon evaluation today patient presents for initial inspection here in our clinic concerning issues he has been having with the bottoms of his feet bilaterally. He states these actually occurred as wounds when he was hospitalized for 5 months secondary [name] Covid. He was apparently with tilting bed where he was in an upright position quite frequently and apparently this occurred in some way shape or form during that time. Fortunately there is  no sign of active infection at this time. No fevers, chills, nausea, vomiting, or diarrhea. With that being said he still has substantial wounds on the plantar aspects of his feet Theragen require quite a bit of work [name] get these [name] heal. He has been using Santyl currently though that is been problematic both in receiving the medication as well as actually paid for it as it is become quite expensive. Prior [name] the experience with Covid the patient really did not have any major medical [name], [name] ([id number]) [id number] Page 11 of 19 problems other than hypertension he does have some mild generalized weakness following the Covid experience. 07/22/2020 on evaluation today patient appears [name] be doing okay in regard [name] his foot ulcers I feel like the wound beds are showing signs of better improvement that I do believe the Iodoflex is helping in this regard. With that being said he does have a lot of drainage currently and this is somewhat blue/green in nature which is consistent with Pseudomonas. I do think a culture today would be appropriate for [place] [name] evaluate and see if that is indeed the case I would likely start him on antibiotic orally as well he is not allergic [name] Cipro knows of no issues he has had in the past 12/21; patient was admitted [name] the clinic earlier this month with bilateral presumed pressure ulcers on the bottom of his feet apparently related [name] excessive pressure from a tilt table arrangement in the intensive care unit. Patient relates this [name] being on ECMO but I am not really sure that is exactly related [name] that. I must say I have never seen anything like  of 19 None Hospitalization / Surgery History Type of Hospitalization/Surgery COVID PNA 07/22/2019- 11/14/2019 03/27/2020 wound debridement/ skin graft Family and Social History Cancer: Yes - Maternal Grandparents; Diabetes: Yes - Father,Paternal Grandparents; Heart Disease: Yes - Maternal Grandparents; Hereditary Spherocytosis: No; Hypertension: Yes - Father,Paternal Grandparents; Kidney Disease: No; Lung Disease: Yes - Siblings; Seizures: No; Stroke: No; Thyroid Problems: No; Tuberculosis: No; Never smoker; Marital Status - Married; Alcohol  Use: Never; Drug Use: No History; Caffeine Use: Daily - tea, soda; Financial Concerns: No; Food, Clothing or Shelter Needs: No; Support System Lacking: No; Transportation Concerns: No Electronic Signature(s) Signed: 05/11/2023 12:55:03 PM By: [name] MD FACS Entered By: [name] on 05/11/2023 08:22:31 -------------------------------------------------------------------------------- SuperBill Details Patient Name: Date of Service: [name], [name] [name] 05/11/2023 Medical Record Number: [id number] Patient Account Number: [id number] Date of Birth/Sex: Treating RN: 1976-03-03 (47 y.o. M) Primary Care Provider: Dorinda Hill Other Clinician: Referring Provider: Treating Provider/Extender: Jana Hakim in Treatment: 147 Diagnosis Coding ICD-10 Codes Code Description [phone] Non-pressure chronic ulcer of other part of right foot with fat layer exposed L97.522 Non-pressure chronic ulcer of other part of left foot with fat layer exposed Facility Procedures : CPT4 Code: [id number] Description: 97597 - DEBRIDE WOUND 1ST 20 SQ CM OR < ICD-10 Diagnosis Description L97.512 Non-pressure chronic ulcer of other part of right foot with fat layer exposed L97.522 Non-pressure chronic ulcer of other part of left foot with fat layer exposed Modifier: Quantity: 1 Physician Procedures : CPT4 Code Description Modifier [id number] 99214 - WC PHYS LEVEL 4 - EST PT 25 ICD-10 Diagnosis Description L97.512 Non-pressure chronic ulcer of other part of right foot with fat layer exposed L97.522 Non-pressure chronic ulcer of other part of left foot  with fat layer exposed Quantity: 1 : [id number] 97597 - WC PHYS DEBR WO ANESTH 20 SQ CM ICD-10 Diagnosis Description L97.512 Non-pressure chronic ulcer of other part of right foot with fat layer exposed L97.522 Non-pressure chronic ulcer of other part of left foot with fat layer exposed Quantity: 1 Electronic Signature(s) Signed: 05/11/2023 11:26:18 AM  By: [name] MD FACS Entered By: [name] on 05/11/2023 08:26:18  surface and minimal eschar around the edges. 03/15/2023: The wound measured narrower today. There is some slough accumulation on the surface and some eschar around the edges. 03/23/2023: The length of the wound remains stable, but it continues [name] narrow. The skin that has covered the posterior aspect of the calcaneus is more robust and has better tensile integrity. Minimal slough and eschar accumulation. 03/30/2023: The wound length has decreased. This seems [name] be primarily due [name] more epithelium covering the posterior aspect of the site. Minimal slough and eschar buildup. 04/07/2023: The wound measured a little bit smaller again today. There is minimal slough accumulation with just a little bit of eschar and callus around the edges. 04/13/2023: The lateral aspect of his wound is starting [name] epithelialize. There is very minimal slough on the surface. Moisture control is good. 04/20/2023: Continued, albeit extremely slow, encroachment of  epithelium as of the wound surface. Minimal slough. Moisture balance is good. 04/27/2023: More epithelium is filling in from the heel area. There is a little bit of slough on the surface with some periwound callus and eschar. He did note a little bit of drainage coming from his right foot and there is a small crack in the skin extending medially from the main site of his prior ulceration. 05/04/2023: The wounds are stable this week. No significant change. There is a little bit of callus buildup around the edges of each site and some thin slough on the surface on the left foot. 05/11/2023: The skin on the right heel is filling in further. There is a little bit of slough accumulation on both surfaces along with periwound callus accumulation. Electronic Signature(s) Signed: 05/11/2023 11:22:23 AM By: [name] MD FACS Entered By: [name] on 05/11/2023 08:22:23 -------------------------------------------------------------------------------- Physical Exam Details Patient Name: Date of Service: [name], [name] [place] E. 05/11/2023 10:15 A M Medical Record Number: [id number] Patient Account Number: [id number] Date of Birth/Sex: Treating RN: October 01, 1975 (47 y.o. M) Primary Care Provider: Dorinda Hill Other Clinician: Referring Provider: Treating Provider/Extender: Jana Hakim in Treatment: 147 Constitutional . . . . no acute distress. Respiratory Normal work of breathing on room air. Notes 05/11/2023: The skin on the right heel is filling in further. There is a little bit of slough accumulation on both surfaces along with periwound callus accumulation. Electronic Signature(s) Signed: 05/11/2023 11:24:38 AM By: [name] MD FACS Entered By: [name] on 05/11/2023 08:24:37 Physician Orders Details -------------------------------------------------------------------------------- [name] ([id number]) [id number] Page 8 of  19 Patient Name: Date of Service: [name], [name] [name] 05/11/2023 10:15 A M Medical Record Number: [id number] Patient Account Number: [id number] Date of Birth/Sex: Treating RN: 08-Aug-1976 (47 y.o. [name] Primary Care Provider: Dorinda Hill Other Clinician: Referring Provider: Treating Provider/Extender: Jana Hakim in Treatment: 147 The following information was scribed by: Brenton Grills The information was scribed for: [name] Verbal / Phone Orders: No Diagnosis Coding ICD-10 Coding Code Description [phone] Non-pressure chronic ulcer of other part of right foot with fat layer exposed L97.522 Non-pressure chronic ulcer of other part of left foot with fat layer exposed Follow-up Appointments ppointment in 1 week. - Dr. Lady Gary Room 3 - Return A Anesthetic Wound #2 Left Calcaneus (In clinic) Topical Lidocaine 4% applied [name] wound bed - In clinic Bathing/ Shower/ Hygiene May shower and wash wound with soap and water. Edema Control - Lymphedema / SCD / Other Bilateral Lower Extremities Avoid standing for long periods of time. Moisturize legs daily. -  of 19 None Hospitalization / Surgery History Type of Hospitalization/Surgery COVID PNA 07/22/2019- 11/14/2019 03/27/2020 wound debridement/ skin graft Family and Social History Cancer: Yes - Maternal Grandparents; Diabetes: Yes - Father,Paternal Grandparents; Heart Disease: Yes - Maternal Grandparents; Hereditary Spherocytosis: No; Hypertension: Yes - Father,Paternal Grandparents; Kidney Disease: No; Lung Disease: Yes - Siblings; Seizures: No; Stroke: No; Thyroid Problems: No; Tuberculosis: No; Never smoker; Marital Status - Married; Alcohol  Use: Never; Drug Use: No History; Caffeine Use: Daily - tea, soda; Financial Concerns: No; Food, Clothing or Shelter Needs: No; Support System Lacking: No; Transportation Concerns: No Electronic Signature(s) Signed: 05/11/2023 12:55:03 PM By: [name] MD FACS Entered By: [name] on 05/11/2023 08:22:31 -------------------------------------------------------------------------------- SuperBill Details Patient Name: Date of Service: [name], [name] [name] 05/11/2023 Medical Record Number: [id number] Patient Account Number: [id number] Date of Birth/Sex: Treating RN: 1976-03-03 (47 y.o. M) Primary Care Provider: Dorinda Hill Other Clinician: Referring Provider: Treating Provider/Extender: Jana Hakim in Treatment: 147 Diagnosis Coding ICD-10 Codes Code Description [phone] Non-pressure chronic ulcer of other part of right foot with fat layer exposed L97.522 Non-pressure chronic ulcer of other part of left foot with fat layer exposed Facility Procedures : CPT4 Code: [id number] Description: 97597 - DEBRIDE WOUND 1ST 20 SQ CM OR < ICD-10 Diagnosis Description L97.512 Non-pressure chronic ulcer of other part of right foot with fat layer exposed L97.522 Non-pressure chronic ulcer of other part of left foot with fat layer exposed Modifier: Quantity: 1 Physician Procedures : CPT4 Code Description Modifier [id number] 99214 - WC PHYS LEVEL 4 - EST PT 25 ICD-10 Diagnosis Description L97.512 Non-pressure chronic ulcer of other part of right foot with fat layer exposed L97.522 Non-pressure chronic ulcer of other part of left foot  with fat layer exposed Quantity: 1 : [id number] 97597 - WC PHYS DEBR WO ANESTH 20 SQ CM ICD-10 Diagnosis Description L97.512 Non-pressure chronic ulcer of other part of right foot with fat layer exposed L97.522 Non-pressure chronic ulcer of other part of left foot with fat layer exposed Quantity: 1 Electronic Signature(s) Signed: 05/11/2023 11:26:18 AM  By: [name] MD FACS Entered By: [name] on 05/11/2023 08:26:18  [name], [name] ([id number]) [id number] Page 1 of 19 Visit Report for 05/11/2023 Chief Complaint Document Details Patient Name: Date of Service: [name], [name] [name] 05/11/2023 10:15 A M Medical Record Number: [id number] Patient Account Number: [id number] Date of Birth/Sex: Treating RN: 29-Nov-1975 (47 y.o. M) Primary Care Provider: Dorinda Hill Other Clinician: Referring Provider: Treating Provider/Extender: Jana Hakim in Treatment: 147 Information Obtained from: Patient Chief Complaint Bilateral Plantar Foot Ulcers Electronic Signature(s) Signed: 05/11/2023 11:21:41 AM By: [name] MD FACS Entered By: [name] on 05/11/2023 08:21:41 -------------------------------------------------------------------------------- Debridement Details Patient Name: Date of Service: [name], [name] [place] E. 05/11/2023 10:15 A M Medical Record Number: [id number] Patient Account Number: [id number] Date of Birth/Sex: Treating RN: 04-14-1976 (47 y.o. [name] Primary Care Provider: Dorinda Hill Other Clinician: Referring Provider: Treating Provider/Extender: Jana Hakim in Treatment: 147 Debridement Performed for Assessment: Wound #2 Left Calcaneus Performed By: Physician [name], MD The following information was scribed by: Brenton Grills The information was scribed for: [name] Debridement Type: Debridement Level of Consciousness (Pre-procedure): Awake and Alert Pre-procedure Verification/Time Out Yes - 10:40 Taken: Start Time: 10:42 Pain Control: Lidocaine 4% Topical Solution Percent of Wound Bed Debrided: 100% T Area Debrided (cm): otal 3.3 Tissue and other material debrided: Slough, Slough Level: Non-Viable Tissue Debridement Description: Selective/Open Wound Instrument: Curette Bleeding: Minimum Hemostasis Achieved: Pressure Response [name] Treatment: Procedure was tolerated well Level of  Consciousness (Post- Awake and Alert procedure): Post Debridement Measurements of Total Wound Length: (cm) 3.5 Stage: Category/Stage III Width: (cm) 1.2 Depth: (cm) 0.1 Volume: (cm) 0.33 Character of Wound/Ulcer Post Debridement: Improved Post Procedure Diagnosis [name], [name] ([id number]) [id number] Page 2 of 19 Same as Pre-procedure Electronic Signature(s) Signed: 05/11/2023 12:55:03 PM By: [name] MD FACS Signed: 05/11/2023 4:35:23 PM By: Brenton Grills Entered By: Brenton Grills on 05/11/2023 07:44:27 -------------------------------------------------------------------------------- Debridement Details Patient Name: Date of Service: [name], [name] [name] 05/11/2023 10:15 A M Medical Record Number: [id number] Patient Account Number: [id number] Date of Birth/Sex: Treating RN: 1976/04/29 (47 y.o. [name] Primary Care Provider: Dorinda Hill Other Clinician: Referring Provider: Treating Provider/Extender: Jana Hakim in Treatment: 147 Debridement Performed for Assessment: Wound #5 Right,Plantar Foot Performed By: Physician [name], MD The following information was scribed by: Brenton Grills The information was scribed for: [name] Debridement Type: Debridement Level of Consciousness (Pre-procedure): Awake and Alert Pre-procedure Verification/Time Out Yes - 10:40 Taken: Start Time: 10:42 Pain Control: Lidocaine 4% T opical Solution Percent of Wound Bed Debrided: 100% T Area Debrided (cm): otal 0.08 Tissue and other material debrided: Callus, Slough, Slough Level: Non-Viable Tissue Debridement Description: Selective/Open Wound Instrument: Curette Bleeding: Minimum Hemostasis Achieved: Pressure Response [name] Treatment: Procedure was tolerated well Level of Consciousness (Post- Awake and Alert procedure): Post Debridement Measurements of Total Wound Length: (cm) 1 Stage: Category/Stage  II Width: (cm) 0.1 Depth: (cm) 0.5 Volume: (cm) 0.039 Character of Wound/Ulcer Post Debridement: Improved Post Procedure Diagnosis Same as Pre-procedure Electronic Signature(s) Signed: 05/11/2023 12:55:03 PM By: [name] MD FACS Signed: 05/11/2023 4:35:23 PM By: Brenton Grills Entered By: Brenton Grills on 05/11/2023 07:45:32 -------------------------------------------------------------------------------- HPI Details Patient Name: Date of Service: [name], [name] [place] E. 05/11/2023 10:15 A M Medical Record Number: [id number] Patient Account Number: [id number] Date of Birth/Sex: Treating RN: 25-Apr-1976 (47 y.o. M) Primary Care Provider: Dorinda Hill Other Clinician: Morton [name] ([id number]) [id number] Page 3 of 19 Referring Provider: Treating Provider/Extender: Jana Hakim in

## 2023-05-11 NOTE — Progress Notes (Signed)
Discharge Instruction: Secure with Kerlix as directed. Compression Wrap Compression Stockings Add-Ons Electronic Signature(s) Signed: 05/11/2023 11:21:29 AM By: [name] MD [name] ([id number]) [id number] Page 5 of 10 Entered By: [name] on 05/11/2023 08:21:29 -------------------------------------------------------------------------------- Multi-Disciplinary Care Plan Details Patient Name: Date of Service: [name] [place] 05/11/2023 10:15 A M Medical Record Number: [id number] Patient Account Number: [id number] Date of Birth/Sex: Treating RN: 11-25-1975 (47 y.o. [name] Primary Care Tammela Bales:  Dorinda Hill Other Clinician: Referring Denese Mentink: Treating [name]/Extender: Jana Hakim in Treatment: 147 Multidisciplinary Care Plan reviewed with physician Active Inactive Wound/Skin Impairment Nursing Diagnoses: Impaired tissue integrity Knowledge deficit related to ulceration/compromised skin integrity Goals: Patient/caregiver will verbalize understanding of skin care regimen Date Initiated: 07/15/2020 Target Resolution Date: 05/08/2023 Goal Status: Active Ulcer/skin breakdown will have a volume reduction of 30% by week 4 Date Initiated: 07/15/2020 Date Inactivated: 08/20/2020 Target Resolution Date: 09/03/2020 Goal Status: Unmet Unmet Reason: no major changes. Ulcer/skin breakdown will heal within 14 weeks Date Initiated: 12/04/2020 Date Inactivated: 12/10/2020 Target Resolution Date: 12/10/2020 Unmet Reason: wounds still open at 14 Goal Status: Unmet weeks and today 21 weeks. Interventions: Assess patient/caregiver ability to obtain necessary supplies Assess patient/caregiver ability to perform ulcer/skin care regimen upon admission and as needed Assess ulceration(s) every visit Provide education on ulcer and skin care Treatment Activities: Skin care regimen initiated : 07/15/2020 Topical wound management initiated : 07/15/2020 Notes: Electronic Signature(s) Signed: 05/11/2023 4:35:23 PM By: Brenton Grills Entered By: Brenton Grills on 05/11/2023 07:34:06 -------------------------------------------------------------------------------- Pain Assessment Details Patient Name: Date of Service: [name], TO [name] 05/11/2023 10:15 A M Medical Record Number: [id number] Patient Account Number: [id number] Date of Birth/Sex: Treating RN: 09-15-1975 (47 y.o. [name] Primary Care Chaye Misch: Dorinda Hill Other Clinician: Referring Gracia Saggese: Treating [name]: Jana Hakim in Treatment: [address] ([id number])  [id number] Page 6 of 10 Active Problems Location of Pain Severity and Description of Pain Patient Has Paino No Site Locations Pain Management and Medication Current Pain Management: Electronic Signature(s) Signed: 05/11/2023 4:35:23 PM By: Brenton Grills Entered By: Brenton Grills on 05/11/2023 07:25:21 -------------------------------------------------------------------------------- Patient/Caregiver Education Details Patient Name: Date of Service: [name] 10/3/2024andnbsp10:15 A M Medical Record Number: [id number] Patient Account Number: [id number] Date of Birth/Gender: Treating RN: 1976-06-11 (47 y.o. [name] Primary Care Physician: Dorinda Hill Other Clinician: Referring Physician: Treating Physician/Extender: Jana Hakim in Treatment: 147 Education Assessment Education Provided To: Patient and Caregiver Education Topics Provided Wound/Skin Impairment: Methods: Explain/Verbal Responses: State content correctly Electronic Signature(s) Signed: 05/11/2023 4:35:23 PM By: Brenton Grills Entered By: Brenton Grills on 05/11/2023 07:34:37 [name] ([id number]) [id number] Page 7 of 10 -------------------------------------------------------------------------------- Wound Assessment Details Patient Name: Date of Service: [name] [place] 05/11/2023 10:15 A M Medical Record Number: [id number] Patient Account Number: [id number] Date of Birth/Sex: Treating RN: 1976-01-03 (47 y.o. [name] Primary Care Mariko Nowakowski: Dorinda Hill Other Clinician: Referring Alfa Leibensperger: Treating Cristina Ceniceros/Extender: Jana Hakim in Treatment: 147 Wound Status Wound Number: 2 Primary Etiology: Pressure Ulcer Wound Location: Left Calcaneus Wound Status: Open Wounding Event: Pressure Injury Comorbid History: Asthma, Angina, Hypertension Date Acquired: 10/07/2019 Weeks Of Treatment:  147 Clustered Wound: No Photos Wound Measurements Length: (cm) 3.5 Width: (cm) 1.2 Depth: (cm) 0.1 Area: (cm) 3.299 Volume: (cm) 0.33 % Reduction in Area: 87.8% % Reduction in Volume: 98.8% Epithelialization: Small (1-33%) Tunneling: No Undermining: No Wound Description Classification: Category/Stage III Wound  Discharge Instruction: Secure with Kerlix as directed. Compression Wrap Compression Stockings Add-Ons Electronic Signature(s) Signed: 05/11/2023 11:21:29 AM By: [name] MD [name] ([id number]) [id number] Page 5 of 10 Entered By: [name] on 05/11/2023 08:21:29 -------------------------------------------------------------------------------- Multi-Disciplinary Care Plan Details Patient Name: Date of Service: [name] [place] 05/11/2023 10:15 A M Medical Record Number: [id number] Patient Account Number: [id number] Date of Birth/Sex: Treating RN: 11-25-1975 (47 y.o. [name] Primary Care Tammela Bales:  Dorinda Hill Other Clinician: Referring Denese Mentink: Treating [name]/Extender: Jana Hakim in Treatment: 147 Multidisciplinary Care Plan reviewed with physician Active Inactive Wound/Skin Impairment Nursing Diagnoses: Impaired tissue integrity Knowledge deficit related to ulceration/compromised skin integrity Goals: Patient/caregiver will verbalize understanding of skin care regimen Date Initiated: 07/15/2020 Target Resolution Date: 05/08/2023 Goal Status: Active Ulcer/skin breakdown will have a volume reduction of 30% by week 4 Date Initiated: 07/15/2020 Date Inactivated: 08/20/2020 Target Resolution Date: 09/03/2020 Goal Status: Unmet Unmet Reason: no major changes. Ulcer/skin breakdown will heal within 14 weeks Date Initiated: 12/04/2020 Date Inactivated: 12/10/2020 Target Resolution Date: 12/10/2020 Unmet Reason: wounds still open at 14 Goal Status: Unmet weeks and today 21 weeks. Interventions: Assess patient/caregiver ability to obtain necessary supplies Assess patient/caregiver ability to perform ulcer/skin care regimen upon admission and as needed Assess ulceration(s) every visit Provide education on ulcer and skin care Treatment Activities: Skin care regimen initiated : 07/15/2020 Topical wound management initiated : 07/15/2020 Notes: Electronic Signature(s) Signed: 05/11/2023 4:35:23 PM By: Brenton Grills Entered By: Brenton Grills on 05/11/2023 07:34:06 -------------------------------------------------------------------------------- Pain Assessment Details Patient Name: Date of Service: [name], TO [name] 05/11/2023 10:15 A M Medical Record Number: [id number] Patient Account Number: [id number] Date of Birth/Sex: Treating RN: 09-15-1975 (47 y.o. [name] Primary Care Chaye Misch: Dorinda Hill Other Clinician: Referring Gracia Saggese: Treating [name]: Jana Hakim in Treatment: [address] ([id number])  [id number] Page 6 of 10 Active Problems Location of Pain Severity and Description of Pain Patient Has Paino No Site Locations Pain Management and Medication Current Pain Management: Electronic Signature(s) Signed: 05/11/2023 4:35:23 PM By: Brenton Grills Entered By: Brenton Grills on 05/11/2023 07:25:21 -------------------------------------------------------------------------------- Patient/Caregiver Education Details Patient Name: Date of Service: [name] 10/3/2024andnbsp10:15 A M Medical Record Number: [id number] Patient Account Number: [id number] Date of Birth/Gender: Treating RN: 1976-06-11 (47 y.o. [name] Primary Care Physician: Dorinda Hill Other Clinician: Referring Physician: Treating Physician/Extender: Jana Hakim in Treatment: 147 Education Assessment Education Provided To: Patient and Caregiver Education Topics Provided Wound/Skin Impairment: Methods: Explain/Verbal Responses: State content correctly Electronic Signature(s) Signed: 05/11/2023 4:35:23 PM By: Brenton Grills Entered By: Brenton Grills on 05/11/2023 07:34:37 [name] ([id number]) [id number] Page 7 of 10 -------------------------------------------------------------------------------- Wound Assessment Details Patient Name: Date of Service: [name] [place] 05/11/2023 10:15 A M Medical Record Number: [id number] Patient Account Number: [id number] Date of Birth/Sex: Treating RN: 1976-01-03 (47 y.o. [name] Primary Care Mariko Nowakowski: Dorinda Hill Other Clinician: Referring Alfa Leibensperger: Treating Cristina Ceniceros/Extender: Jana Hakim in Treatment: 147 Wound Status Wound Number: 2 Primary Etiology: Pressure Ulcer Wound Location: Left Calcaneus Wound Status: Open Wounding Event: Pressure Injury Comorbid History: Asthma, Angina, Hypertension Date Acquired: 10/07/2019 Weeks Of Treatment:  147 Clustered Wound: No Photos Wound Measurements Length: (cm) 3.5 Width: (cm) 1.2 Depth: (cm) 0.1 Area: (cm) 3.299 Volume: (cm) 0.33 % Reduction in Area: 87.8% % Reduction in Volume: 98.8% Epithelialization: Small (1-33%) Tunneling: No Undermining: No Wound Description Classification: Category/Stage III Wound  in Discharge Instruction: Apply over primary dressing as directed. Secured With [employer], 4.5x3.1 (in/yd) Discharge Instruction: Secure with Kerlix as directed. Compression Wrap Compression Stockings Add-Ons [name], [name] ([id number]) [id number].pdf Page 10 of 10 Electronic Signature(s) Signed: 05/11/2023 4:35:23 PM By: Brenton Grills Entered By: Brenton Grills on 05/11/2023 07:33:12 -------------------------------------------------------------------------------- Vitals Details Patient Name: Date of Service: [name], [name] 05/11/2023 10:15 A M Medical Record Number: [id number] Patient Account Number: [id number] Date of Birth/Sex: Treating RN: 12/20/1975 (46 y.o. [name] Primary Care Glanda Spanbauer: Dorinda Hill Other Clinician: Referring Jigar Zielke: Treating Minsa Weddington/Extender: Jana Hakim in Treatment: 147 Vital Signs Time Taken: 10:17 Temperature  (F): 97.9 Height (in): 69 Pulse (bpm): 80 Weight (lbs): 280 Respiratory Rate (breaths/min): 18 Body Mass Index (BMI): 41.3 Blood Pressure (mmHg): 121/77 Reference Range: 80 - 120 mg / dl Electronic Signature(s) Signed: 05/11/2023 4:35:23 PM By: Brenton Grills Entered By: Brenton Grills on 05/11/2023 07:22:59  Margin: Distinct, outline attached Exudate Amount: Medium Exudate Type: Serosanguineous Exudate Color: red, brown Foul Odor After Cleansing: No Slough/Fibrino No Wound Bed Granulation Amount: Large (67-100%) Exposed Structure Granulation Quality: Pink Fascia Exposed: No Necrotic Amount: Small (1-33%) Fat Layer (Subcutaneous Tissue) Exposed: Yes Necrotic Quality: Adherent Slough Tendon Exposed: No Muscle Exposed: No Joint Exposed: No Bone Exposed: No Periwound Skin Texture Texture Color No Abnormalities Noted: No No Abnormalities Noted: Yes Callus: Yes Temperature / Pain Crepitus: No Temperature: No Abnormality Excoriation: No Tenderness on Palpation: Yes Induration: No Rash: No Scarring: Yes Moisture No Abnormalities Noted: Yes Treatment Notes Wound #2 (Calcaneus) Wound Laterality: Left Cleanser Normal Saline Discharge Instruction: Cleanse the wound with Normal Saline prior to applying a clean dressing using gauze sponges, not tissue or cotton balls. [name], [name] ([id number]) [id number].pdf Page 8 of 10 Wound Cleanser Discharge Instruction: Cleanse the wound with wound cleanser prior to applying a clean dressing using gauze sponges, not tissue or cotton balls. Peri-Wound Care Ketoconazole Cream 2% Discharge Instruction: Apply Ketoconazole mixed with zinc to the periwound Zinc Oxide Ointment 30g tube Discharge Instruction: Apply Zinc Oxide to periwound with each dressing change Topical Gentamicin Discharge Instruction: apply to the wound bed Primary Dressing Maxorb Extra Ag+ Alginate Dressing, 4x4.75 (in/in) Discharge  Instruction: Apply to wound bed as instructed Secondary Dressing Drawtex 4x4 in Discharge Instruction: Apply over primary dressing as directed. Optifoam Non-Adhesive Dressing, 4x4 in Discharge Instruction: Apply over primary dressing as directed. Woven Gauze Sponge, Non-Sterile 4x4 in Discharge Instruction: Apply over primary dressing as directed. Secured With 24M Medipore H Soft Cloth Surgical T ape, 4 x 10 (in/yd) Discharge Instruction: Secure with tape as directed. Compression Wrap Kerlix Roll 4.5x3.1 (in/yd) Discharge Instruction: Apply Kerlix and Coban compression as directed. Compression Stockings Add-Ons [employer] Offloading Boot, Size Regular Electronic Signature(s) Signed: 05/11/2023 4:35:23 PM By: Brenton Grills Entered By: Brenton Grills on 05/11/2023 07:32:15 -------------------------------------------------------------------------------- Wound Assessment Details Patient Name: Date of Service: [name], TO [name] 05/11/2023 10:15 A M Medical Record Number: [id number] Patient Account Number: [id number] Date of Birth/Sex: Treating RN: 1975-11-27 (47 y.o. [name] Primary Care Arda Daggs: Dorinda Hill Other Clinician: Referring Sohaib Vereen: Treating [name]/Extender: Jana Hakim in Treatment: 147 Wound Status Wound Number: 5 Primary Etiology: Pressure Ulcer Wound Location: Right, Plantar Foot Wound Status: Open Wounding Event: Gradually Appeared Comorbid History: Asthma, Angina, Hypertension Date Acquired: 04/27/2023 Weeks Of Treatment: 2 Clustered Wound: No Photos [name], [name] ([id number]) [id number].pdf Page 9 of 10 Wound Measurements Length: (cm) 1 Width: (cm) 0.1 Depth: (cm) 0.5 Area: (cm) 0.079 Volume: (cm) 0.039 % Reduction in Area: -154.8% % Reduction in Volume: -1200% Epithelialization: Small (1-33%) Tunneling: No Undermining: No Wound Description Classification: Category/Stage II Wound Margin:  Distinct, outline attached Exudate Amount: Medium Exudate Type: Serosanguineous Exudate Color: red, brown Foul Odor After Cleansing: No Slough/Fibrino Yes Wound Bed Granulation Amount: Large (67-100%) Exposed Structure Granulation Quality: Red Fat Layer (Subcutaneous Tissue) Exposed: Yes Necrotic Amount: None Present (0%) Periwound Skin Texture Texture Color No Abnormalities Noted: No No Abnormalities Noted: Yes Callus: Yes Temperature / Pain Rash: No Temperature: No Abnormality Scarring: Yes Moisture No Abnormalities Noted: No Dry / Scaly: Yes Treatment Notes Wound #5 (Foot) Wound Laterality: Plantar, Right Cleanser Peri-Wound Care Topical Gentamicin Discharge Instruction: As directed by physician Primary Dressing Maxorb Extra Ag+ Alginate Dressing, 4x4.75 (in/in) Discharge Instruction: Apply to wound bed as instructed Secondary Dressing Drawtex 4x4 in Discharge Instruction: Apply over primary dressing as directed. Optifoam Non-Adhesive Dressing, 4x4  Discharge Instruction: Secure with Kerlix as directed. Compression Wrap Compression Stockings Add-Ons Electronic Signature(s) Signed: 05/11/2023 11:21:29 AM By: [name] MD [name] ([id number]) [id number] Page 5 of 10 Entered By: [name] on 05/11/2023 08:21:29 -------------------------------------------------------------------------------- Multi-Disciplinary Care Plan Details Patient Name: Date of Service: [name] [place] 05/11/2023 10:15 A M Medical Record Number: [id number] Patient Account Number: [id number] Date of Birth/Sex: Treating RN: 11-25-1975 (47 y.o. [name] Primary Care Tammela Bales:  Dorinda Hill Other Clinician: Referring Denese Mentink: Treating [name]/Extender: Jana Hakim in Treatment: 147 Multidisciplinary Care Plan reviewed with physician Active Inactive Wound/Skin Impairment Nursing Diagnoses: Impaired tissue integrity Knowledge deficit related to ulceration/compromised skin integrity Goals: Patient/caregiver will verbalize understanding of skin care regimen Date Initiated: 07/15/2020 Target Resolution Date: 05/08/2023 Goal Status: Active Ulcer/skin breakdown will have a volume reduction of 30% by week 4 Date Initiated: 07/15/2020 Date Inactivated: 08/20/2020 Target Resolution Date: 09/03/2020 Goal Status: Unmet Unmet Reason: no major changes. Ulcer/skin breakdown will heal within 14 weeks Date Initiated: 12/04/2020 Date Inactivated: 12/10/2020 Target Resolution Date: 12/10/2020 Unmet Reason: wounds still open at 14 Goal Status: Unmet weeks and today 21 weeks. Interventions: Assess patient/caregiver ability to obtain necessary supplies Assess patient/caregiver ability to perform ulcer/skin care regimen upon admission and as needed Assess ulceration(s) every visit Provide education on ulcer and skin care Treatment Activities: Skin care regimen initiated : 07/15/2020 Topical wound management initiated : 07/15/2020 Notes: Electronic Signature(s) Signed: 05/11/2023 4:35:23 PM By: Brenton Grills Entered By: Brenton Grills on 05/11/2023 07:34:06 -------------------------------------------------------------------------------- Pain Assessment Details Patient Name: Date of Service: [name], TO [name] 05/11/2023 10:15 A M Medical Record Number: [id number] Patient Account Number: [id number] Date of Birth/Sex: Treating RN: 09-15-1975 (47 y.o. [name] Primary Care Chaye Misch: Dorinda Hill Other Clinician: Referring Gracia Saggese: Treating [name]: Jana Hakim in Treatment: [address] ([id number])  [id number] Page 6 of 10 Active Problems Location of Pain Severity and Description of Pain Patient Has Paino No Site Locations Pain Management and Medication Current Pain Management: Electronic Signature(s) Signed: 05/11/2023 4:35:23 PM By: Brenton Grills Entered By: Brenton Grills on 05/11/2023 07:25:21 -------------------------------------------------------------------------------- Patient/Caregiver Education Details Patient Name: Date of Service: [name] 10/3/2024andnbsp10:15 A M Medical Record Number: [id number] Patient Account Number: [id number] Date of Birth/Gender: Treating RN: 1976-06-11 (47 y.o. [name] Primary Care Physician: Dorinda Hill Other Clinician: Referring Physician: Treating Physician/Extender: Jana Hakim in Treatment: 147 Education Assessment Education Provided To: Patient and Caregiver Education Topics Provided Wound/Skin Impairment: Methods: Explain/Verbal Responses: State content correctly Electronic Signature(s) Signed: 05/11/2023 4:35:23 PM By: Brenton Grills Entered By: Brenton Grills on 05/11/2023 07:34:37 [name] ([id number]) [id number] Page 7 of 10 -------------------------------------------------------------------------------- Wound Assessment Details Patient Name: Date of Service: [name] [place] 05/11/2023 10:15 A M Medical Record Number: [id number] Patient Account Number: [id number] Date of Birth/Sex: Treating RN: 1976-01-03 (47 y.o. [name] Primary Care Mariko Nowakowski: Dorinda Hill Other Clinician: Referring Alfa Leibensperger: Treating Cristina Ceniceros/Extender: Jana Hakim in Treatment: 147 Wound Status Wound Number: 2 Primary Etiology: Pressure Ulcer Wound Location: Left Calcaneus Wound Status: Open Wounding Event: Pressure Injury Comorbid History: Asthma, Angina, Hypertension Date Acquired: 10/07/2019 Weeks Of Treatment:  147 Clustered Wound: No Photos Wound Measurements Length: (cm) 3.5 Width: (cm) 1.2 Depth: (cm) 0.1 Area: (cm) 3.299 Volume: (cm) 0.33 % Reduction in Area: 87.8% % Reduction in Volume: 98.8% Epithelialization: Small (1-33%) Tunneling: No Undermining: No Wound Description Classification: Category/Stage III Wound

## 2023-05-15 ENCOUNTER — Ambulatory Visit (HOSPITAL_BASED_OUTPATIENT_CLINIC_OR_DEPARTMENT_OTHER): Payer: PPO | Attending: Surgery | Admitting: Physical Therapy

## 2023-05-15 DIAGNOSIS — R262 Difficulty in walking, not elsewhere classified: Secondary | ICD-10-CM | POA: Insufficient documentation

## 2023-05-15 DIAGNOSIS — M25662 Stiffness of left knee, not elsewhere classified: Secondary | ICD-10-CM | POA: Insufficient documentation

## 2023-05-15 DIAGNOSIS — M25661 Stiffness of right knee, not elsewhere classified: Secondary | ICD-10-CM | POA: Diagnosis not present

## 2023-05-15 DIAGNOSIS — M6281 Muscle weakness (generalized): Secondary | ICD-10-CM | POA: Diagnosis not present

## 2023-05-15 NOTE — Therapy (Signed)
OUTPATIENT PHYSICAL THERAPY TREATMENT NOTE   Patient Name: [name] MRN: [id number] DOB:29-Jul-1976, 47 y.o., male Today's Date: 05/16/2023  END OF SESSION:  PT End of Session - 05/15/23 1201     Visit Number 4    Number of Visits 5    Date for PT Re-Evaluation 05/29/23    Authorization Type HTA    PT Start Time 1110    PT Stop Time 1155    PT Time Calculation (min) 45 min    Activity Tolerance Patient tolerated treatment well    Behavior During Therapy WFL for tasks assessed/performed                Past Medical History:  Diagnosis Date   Anginal pain (HCC)    with covid   Anxiety    Asthma    Depression    Dyspnea    GERD (gastroesophageal reflux disease)    Headache    History of kidney stones    LEFT URETERAL STONE   HTN (hypertension)    Pancreatitis 2018   GALLBALDDER SLUDGE CAUSED ISSUED RESOLVED   Pneumonia 07/2019   covid   Prediabetes    SOBOE (shortness of breath on exertion)    Past Surgical History:  Procedure Laterality Date   CANNULATION FOR ECMO (EXTRACORPOREAL MEMBRANE OXYGENATION) N/A 08/28/2019   Procedure: CANNULATION FOR VV ECMO (EXTRACORPOREAL MEMBRANE OXYGENATION);  Surgeon: Donata Clay, Theron Arista, MD;  Location: [facility];  Service: Open Heart Surgery;  Laterality: N/A;  CRESCENT CANNULA   CANNULATION FOR ECMO (EXTRACORPOREAL MEMBRANE OXYGENATION) N/A 09/10/2019   Procedure: CANNULATION FOR ECMO (EXTRACORPOREAL MEMBRANE OXYGENATION) PUTTING IN CRESCENT 32FR CANNULA  AND REMOVING GROING CANNULATION;  Surgeon: Linden Dolin, MD;  Location: [facility] OR;  Service: Open Heart Surgery;  Laterality: N/A;  PUTTING IN CRESCENT/REMOVING GROIN CANNULATION   CYSTOSCOPY/URETEROSCOPY/HOLMIUM LASER/STENT PLACEMENT Left 04/18/2019   Procedure: LEFT URETEROSCOPY/HOLMIUM LASER/STENT PLACEMENT;  Surgeon: Crist Fat, MD;  Location: [name];  Service: Urology;  Laterality: Left;   CYSTOSCOPY/URETEROSCOPY/HOLMIUM LASER/STENT PLACEMENT Left  05/02/2019   Procedure: CYSTOSCOPY/URETEROSCOPY/HOLMIUM LASER/STENT EXCHANGE;  Surgeon: Crist Fat, MD;  Location: [facility];  Service: Urology;  Laterality: Left;   ECMO CANNULATION N/A 08/03/2019   Procedure: ECMO CANNULATION;  Surgeon: Linden Dolin, MD;  Location: [facility];  Service: Cardiovascular;  Laterality: N/A;   ESOPHAGOGASTRODUODENOSCOPY N/A 09/11/2019   Procedure: ESOPHAGOGASTRODUODENOSCOPY (EGD);  Surgeon: Linden Dolin, MD;  Location: [facility] OR;  Service: Thoracic;  Laterality: N/A;   GRAFT APPLICATION Bilateral 03/27/2020   Procedure: APPLICATION OF SKIN GRAFT BILATERAL FEET;  Surgeon: Felecia Shelling, DPM;  Location: [facility];  Service: Podiatry;  Laterality: Bilateral;   IR REPLC GASTRO/COLONIC TUBE PERCUT W/FLUORO  10/14/2019   IRRIGATION AND DEBRIDEMENT SHOULDER Right 09/29/2017   Procedure: IRRIGATION AND DEBRIDEMENT SHOULDER;  Surgeon: Tarry Kos, MD;  Location: [facility] OR;  Service: Orthopedics;  Laterality: Right;   LUMBAR DISC SURGERY  2002   NASAL ENDOSCOPY WITH EPISTAXIS CONTROL N/A 08/31/2019   Procedure: NASAL ENDOSCOPY WITH EPISTAXIS CONTROL WITH CAUTERIZATION;  Surgeon: Christia Reading, MD;  Location: [facility] OR;  Service: ENT;  Laterality: N/A;   PORTACATH PLACEMENT N/A 09/11/2019   Procedure: PEG TUBE INSERTION - BEDSIDE;  Surgeon: Linden Dolin, MD;  Location: [facility] OR;  Service: Thoracic;  Laterality: N/A;   TEE WITHOUT CARDIOVERSION N/A 08/28/2019   Procedure: TRANSESOPHAGEAL ECHOCARDIOGRAM (TEE);  Surgeon: Donata Clay, Theron Arista, MD;  Location: [facility] OR;  Service: Open Heart Surgery;  Laterality: N/A;   TEE WITHOUT CARDIOVERSION N/A 09/10/2019   Procedure: TRANSESOPHAGEAL ECHOCARDIOGRAM (TEE);  Surgeon: Linden Dolin, MD;  Location: [facility] OR;  Service: Open Heart Surgery;  Laterality: N/A;   WOUND DEBRIDEMENT Bilateral 03/27/2020   Procedure: EXCISIONAL DEBRIDEMENT OF ULCERS BILATERAL FEET;  Surgeon: Felecia Shelling, DPM;  Location: [facility];  Service: Podiatry;  Laterality:  Bilateral;   Patient Active Problem List   Diagnosis Date Noted   Type 2 diabetes mellitus without complication, without long-term current use of insulin (HCC) 10/18/2022   Vitamin D deficiency 10/18/2022   Other fatigue 09/07/2022   SOBOE (shortness of breath on exertion) 09/07/2022   Nocturnal hypoxemia 09/07/2022   Mood disorder (HCC) 09/07/2022   Ulcer of left foot (HCC) 09/07/2022   Depression 08/23/2022   Prediabetes 08/23/2022   Obstructive sleep apnea 01/11/2022   Insomnia due to other mental disorder 12/06/2021   Left foot drop 09/06/2021   Wheelchair dependence 06/04/2021   Encounter for therapeutic drug monitoring 09/29/2020   COVID-19 long hauler manifesting chronic dyspnea 09/15/2020   Morbid obesity (HCC) with starting BMI 46 09/15/2020   Encounter for attention to tracheostomy (HCC) 09/15/2020   Critical illness polyneuropathy (HCC) 09/15/2020   Moderate protein-calorie malnutrition (HCC) 09/15/2020   Pyogenic inflammation of bone (HCC) 09/09/2020   Long COVID 09/09/2020   Acute osteomyelitis (HCC) 08/31/2020   MSSA (methicillin susceptible Staphylococcus aureus) infection 08/31/2020   Reactive depression 07/01/2020   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   Impaired sensation to light touch 04/15/2020   Diffuse alveolar damage (HCC) 03/09/2020   Eschar of foot    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   History of COVID-19    Pressure injury of skin 08/22/2019   Need for emotional support    Chest tube in place    Personal history of ECMO    Advanced care planning/counseling discussion    Palliative care by specialist    Goals of care, counseling/discussion    Advanced directives, counseling/discussion    Subcutaneous crepitus    Thrombocytopenia (HCC)    Leukopenia    Fever    Gram positive bacterial infection    Septic arthritis of right acromioclavicular joint (HCC) 09/29/2017   Chronic left-sided low back pain with  left-sided sciatica 09/19/2017   Epigastric pain    Primary hypertension 04/17/2017   Moderate persistent asthma 07/21/2015   Allergic rhinoconjunctivitis 07/21/2015   GERD (gastroesophageal reflux disease) 07/21/2015   Abnormal gait 11/12/2009   TARSAL TUNNEL SYNDROME, LEFT 10/08/2009   PES PLANUS 10/08/2009     REFERRING PROVIDER: Berna Bue, MD   REFERRING DIAG: E66.01 (ICD-10-CM) - Morbid obesity due to excess calories   Rationale for Evaluation and Treatment: Rehabilitation  THERAPY DIAG:  Muscle weakness (generalized)  Stiffness of right knee, not elsewhere classified  Stiffness of left knee, not elsewhere classified  Difficulty in walking, not elsewhere classified  ONSET DATE: PT order 02/03/2023  SUBJECTIVE:  SUBJECTIVE STATEMENT: Pt is planning to have bariatric surgery.  MD referred pt to PT to work on some conditioning exercises.  PT order 02/03/2023.  Reason for referral:  deconditioning  Pt has no timelines on when he is able to increase weight on L LE.  Pt is wearing an offloader shoe on L.  Pt states the wounds on his feet are still healing just very slow.  Pt denies any adverse effects after prior Rx and had no pain and soreness after Rx.  Pt states he has not performed his HEP as much as he should.       PERTINENT HISTORY:  L foot ulcer--Pt primarily in W/C and not putting much weight through L LE.  Pt was instructed to stay off his foot as much as possible.  He is in a offloader shoe. Debridement of ulcers in bilat feet and skin graft in 2021.  R foot ulcer with good healing.  Pt receives wound care for L foot ulcer.  Pt states he has some dropfoot on L.   Long covid, chronic back pain, arthritis, depression, HTN, Dyspnea,  Lumbar surgery for bulging disc in 2002 R  shoulder arthrotomy of AC jt, irrigation and debridement of distal clavicle, and partial excision of distal clavicle on 2019   PAIN:  Location:  back NPRS:  0/10 current, 8/10 worst, 2/10 best  Location:  L > R NPRS:  3-4/10  PRECAUTIONS: L foot ulcer--avoid WB'ing of L LE, long covid, lumbar surgery in 2002    WEIGHT BEARING RESTRICTIONS: Pt states he doesn't have specific WB'ing restrictions though was instructed to stay off his foot as much as possible due to L foot ulcer.    FALLS:  Has patient fallen in last 6 months? Yes. Number of falls 2  LIVING ENVIRONMENT: Lives with: lives with their family Lives in: 1 story home Stairs: 5-6 steps with rails; has a ramp Has following equipment at home: W/C, FWW  OCCUPATION: Pt is on disability.  PLOF: Independent  PATIENT GOALS: improve muscle strength and stamina, to be able to walk again.    OBJECTIVE:   DIAGNOSTIC FINDINGS:  Chest x ray on 03/02/2023: FINDINGS: Stable cardiac and mediastinal contours. Similar scarring within the left mid and lower lung and right upper lung. No pleural effusion or pneumothorax. Thoracic spine degenerative changes.   IMPRESSION: No acute cardiopulmonary process. Similar scarring.   TODAY'S TREATMENT:                                                                                                                                   Pt performed:   Seated march with RTB 2x10 reps   LAQ AROM x 12 reps, 1.5# x10 reps, 2# x 10 reps   Seated clams with RTB 2x10    Seated rows with RTB 2x10   Seated shoulder ER with RTB 2x10   Seated shoulder horizontal abd with RTB x10  and GTB x10   Seated knee flexion AROM x10 bilat with table elevated (open chain)   See below for pt education.       PATIENT EDUCATION:  Education details:  POC, HEP, exercise form, relevant anatomy, and rationale of exercises.  PT answered pt and wife's questions.  Person educated: Patient and wife Education method:  Explanation, demonstration, verbal and tactile cues, handout Education comprehension: verbalized understanding, returned demonstration, verbal and tactile cues required  HOME EXERCISE PROGRAM: Access Code: [email or web address] URL: [email or web address] Date: 04/17/2023 Prepared by: Aaron Edelman  Exercises - Seated March  - 1-2 x daily - 7 x weekly - 1-2 sets - 10 reps - Seated Long Arc Quad  - 1 x daily - 7 x weekly - 2 sets - 10 reps - Seated Shoulder Row with Anchored Resistance  - 1 x daily - 4-5 x weekly - 2 sets - 10 reps - Seated Bilateral Shoulder External Rotation with Resistance  - 1 x daily - 4 x weekly - 2 sets - 10 reps - Seated Shoulder Horizontal Abduction with Resistance - Palms Down  - 1 x daily - 4 x weekly - 2 sets - 10 reps   ASSESSMENT:  CLINICAL IMPRESSION: PT increased intensity of exercises with adding resistance to lower extremity exercises.  Pt tolerated progression well without c/o's.  He requires cuing and instruction in correct form and positioning with exercises.  Pt states he hasn't performed as much of his HEP as he should.  PT encouraged pt to be compliant with HEP.  PT is limited with ther ex due to ulcers in bilat feet and is limited with Wb'ing.  He responded well to Rx having no c/o's after Rx.        OBJECTIVE IMPAIRMENTS: decreased activity tolerance, decreased endurance, decreased mobility, difficulty walking, decreased ROM, decreased strength, hypomobility, impaired flexibility, and pain.   ACTIVITY LIMITATIONS: carrying, standing, squatting, stairs, transfers, and locomotion level  PARTICIPATION LIMITATIONS: meal prep, cleaning, laundry, shopping, community activity, and occupation  PERSONAL FACTORS: Fitness and 3+ comorbidities: L foot ulcer, Wb'ing, long covid, lumbar surgery, chronic back pain, arthritis, depression  are also affecting patient's functional outcome.   REHAB POTENTIAL: good for goals  CLINICAL DECISION MAKING:  Evolving/moderate complexity  EVALUATION COMPLEXITY: Low   GOALS:   SHORT TERM GOALS: Target date: 05/01/2023   Pt will perform HEP without adverse effects for improved ROM and strength.  Baseline: Goal status: INITIAL    LONG TERM GOALS: Target date: 05/29/2023  Pt will demo 0 deg of R knee extension AROM and at least 115 deg of bilat knee flexion AROM for improved stiffness and mobility and for improved gait when he is able to ambulate more.   Baseline:  Goal status: INITIAL  2.  Pt will be independent and compliant with HEP for improved pain, ROM, strength, and function.  Baseline:  Goal status: INITIAL  3.  Pt will report at least a 50% improvement in stamina/tolerance with daily activities.  Baseline:  Goal status: INITIAL    PLAN:  PT FREQUENCY:  once every other week  PT DURATION: 8 weeks  PLANNED INTERVENTIONS: Therapeutic exercises, Therapeutic activity, Neuromuscular re-education, Balance training, Gait training, Patient/Family education, Self Care, Joint mobilization, Stair training, DME instructions, Electrical stimulation, Spinal mobilization, Cryotherapy, Moist heat, Taping, Ultrasound, Manual therapy, and Re-evaluation.  PLAN FOR NEXT SESSION: Avoid WB'ing of L LE until more healing has taken place and wound care.  Cont with ther ex.  Review and perform  HEP.  Knee ROM.  Recert vs discharge next Rx.     Audie Clear III PT, DPT 05/16/23 4:54 PM

## 2023-05-16 ENCOUNTER — Encounter (HOSPITAL_BASED_OUTPATIENT_CLINIC_OR_DEPARTMENT_OTHER): Payer: Self-pay | Admitting: Physical Therapy

## 2023-05-18 ENCOUNTER — Encounter (HOSPITAL_BASED_OUTPATIENT_CLINIC_OR_DEPARTMENT_OTHER): Payer: PPO | Admitting: General Surgery

## 2023-05-18 DIAGNOSIS — L97512 Non-pressure chronic ulcer of other part of right foot with fat layer exposed: Secondary | ICD-10-CM | POA: Diagnosis not present

## 2023-05-18 DIAGNOSIS — L89623 Pressure ulcer of left heel, stage 3: Secondary | ICD-10-CM | POA: Diagnosis not present

## 2023-05-18 DIAGNOSIS — L89892 Pressure ulcer of other site, stage 2: Secondary | ICD-10-CM | POA: Diagnosis not present

## 2023-05-18 NOTE — Progress Notes (Signed)
wound bed as instructed Secondary Dressing: Drawtex 4x4 in 1 x Per Day/30 Days Discharge Instructions: Apply over primary dressing as directed. Secondary Dressing: Optifoam Non-Adhesive Dressing, 4x4 in 1 x Per Day/30 Days Discharge Instructions: Apply over primary dressing as directed. Secured With: [employer], 4.5x3.1 (in/yd) 1 x Per Day/30 Days Discharge Instructions: Secure with Kerlix as directed. 05/18/2023: Both wounds are smaller today. The right foot is down [name] just a sliver of opening. I used a curette [name] debride callus from the right foot and callus and slough from the left foot. We will continue topical gentamicin with silver alginate, drawtex, and foam donut offloading bilaterally. Follow-up in 1 week. [name], [name] ([id number]) [id number] Page 17 of 19 Electronic Signature(s) Signed: 05/18/2023 12:48:49 PM By: [name] MD FACS Entered By: [name] on 05/18/2023 12:48:49 -------------------------------------------------------------------------------- HxROS Details Patient Name: Date of Service: [name], [name] [place] E. 05/18/2023 11:00 A M Medical Record Number: [id number] Patient Account Number: [id number] Date of Birth/Sex: Treating RN: 1975/08/26 (47 y.o. M) Primary Care Provider: Dorinda [name] Other Clinician: Referring Provider: Treating Provider/Extender: [name] in Treatment:  148 Information Obtained From Patient Constitutional Symptoms (General Health) Medical History: Past Medical History Notes: COVID PNA 07/22/2019-11/14/2019 [facility] ECMO, foot drop left foot , Eyes Medical History: Negative for: Cataracts; Glaucoma; Optic Neuritis Ear/Nose/Mouth/Throat Medical History: Negative for: Chronic sinus problems/congestion; Middle ear problems Hematologic/Lymphatic Medical History: Negative for: Anemia; Hemophilia; Human Immunodeficiency Virus; Lymphedema; Sickle Cell Disease Respiratory Medical History: Positive for: Asthma Negative for: Aspiration; Chronic Obstructive Pulmonary Disease (COPD); Pneumothorax; Sleep Apnea; Tuberculosis Cardiovascular Medical History: Positive for: Angina - with COVID; Hypertension Negative for: Arrhythmia; Congestive Heart Failure; Coronary Artery Disease; Deep Vein Thrombosis; Hypotension; Myocardial Infarction; Peripheral Arterial Disease; Peripheral Venous Disease; Phlebitis; Vasculitis Gastrointestinal Medical History: Negative for: Cirrhosis ; Colitis; Crohns; Hepatitis A; Hepatitis B; Hepatitis C Endocrine Medical History: Negative for: Type I Diabetes; Type II Diabetes Genitourinary Medical History: Negative for: End Stage Renal Disease Past Medical History Notes: kidney stone Immunological Medical HistoryDMITRY, [name] ([id number]) [id number] Page 18 of 19 Negative for: Lupus Erythematosus; Raynauds; Scleroderma Integumentary (Skin) Medical History: Negative for: History of Burn Musculoskeletal Medical History: Negative for: Gout; Rheumatoid Arthritis; Osteoarthritis; Osteomyelitis Neurologic Medical History: Negative for: Dementia; Neuropathy; Quadriplegia; Paraplegia; Seizure Disorder Oncologic Medical History: Negative for: Received Chemotherapy; Received Radiation Psychiatric Medical History: Negative for: Anorexia/bulimia; Confinement Anxiety Past Medical History  Notes: anxiety Immunizations Pneumococcal Vaccine: Received Pneumococcal Vaccination: No Implantable Devices None Hospitalization / Surgery History Type of Hospitalization/Surgery COVID PNA 07/22/2019- 11/14/2019 03/27/2020 wound debridement/ skin graft Family and Social History Cancer: Yes - Maternal Grandparents; Diabetes: Yes - Father,Paternal Grandparents; Heart Disease: Yes - Maternal Grandparents; Hereditary Spherocytosis: No; Hypertension: Yes - Father,Paternal Grandparents; Kidney Disease: No; Lung Disease: Yes - Siblings; Seizures: No; Stroke: No; Thyroid Problems: No; Tuberculosis: No; Never smoker; Marital Status - Married; Alcohol Use: Never; Drug Use: No History; Caffeine Use: Daily - tea, soda; Financial Concerns: No; Food, Clothing or Shelter Needs: No; Support System Lacking: No; Transportation Concerns: No Psychologist, prison and probation services) Signed: 05/18/2023 1:10:39 PM By: [name] MD FACS Entered By: [name] on 05/18/2023 12:46:58 -------------------------------------------------------------------------------- SuperBill Details Patient Name: Date of Service: [name], [name] [name] 05/18/2023 Medical Record Number: [id number] Patient Account Number: [id number] Date of Birth/Sex: Treating RN: [birth year] (47 y.o. M) Primary Care Provider: Dorinda [name] Other Clinician: Referring Provider: Treating Provider/Extender: [name] in Treatment: 148 Diagnosis Coding ICD-10 Codes Code Description [phone] Non-pressure chronic  [name], [name] ([id number]) [id number] Page 1 of 19 Visit Report for 05/18/2023 Chief Complaint Document Details Patient Name: Date of Service: [name], [name] [name] 05/18/2023 11:00 A M Medical Record Number: [id number] Patient Account Number: [id number] Date of Birth/Sex: Treating RN: 10-Jan-1976 (47 y.o. M) Primary Care Provider: Dorinda [name] Other Clinician: Referring Provider: Treating Provider/Extender: [name] in Treatment: 148 Information Obtained from: Patient Chief Complaint Bilateral Plantar Foot Ulcers Electronic Signature(s) Signed: 05/18/2023 12:44:33 PM By: [name] MD FACS Entered By: [name] on 05/18/2023 12:44:32 -------------------------------------------------------------------------------- Debridement Details Patient Name: Date of Service: [name], [name] [place] E. 05/18/2023 11:00 A M Medical Record Number: [id number] Patient Account Number: [id number] Date of Birth/Sex: Treating RN: [birth year] (47 y.o. [name] Primary Care Provider: Dorinda [name] Other Clinician: Referring Provider: Treating Provider/Extender: [name] in Treatment: 148 Debridement Performed for Assessment: Wound #5 Right,Plantar Foot Performed By: Physician [name], MD The following information was scribed by: Brenton Grills The information was scribed for: [name] Debridement Type: Debridement Level of Consciousness (Pre-procedure): Awake and Alert Pre-procedure Verification/Time Out Yes - 12:00 Taken: Start Time: 12:01 Pain Control: Lidocaine 4% T opical Solution Percent of Wound Bed Debrided: 100% T Area Debrided (cm): otal 0.08 Tissue and other material debrided: Viable, Callus, Slough, Slough Level: Non-Viable Tissue Debridement Description: Selective/Open Wound Instrument: Curette Bleeding: Minimum Hemostasis Achieved: Pressure Response [name] Treatment: Procedure was  tolerated well Level of Consciousness (Post- Awake and Alert procedure): Post Debridement Measurements of Total Wound Length: (cm) 1 Stage: Category/Stage II Width: (cm) 0.1 Depth: (cm) 0.2 Volume: (cm) 0.016 Character of Wound/Ulcer Post Debridement: Improved Post Procedure Diagnosis [name], [name] ([id number]) [id number] Page 2 of 19 Same as Pre-procedure Electronic Signature(s) Signed: 05/18/2023 1:10:39 PM By: [name] MD FACS Signed: 05/18/2023 4:20:59 PM By: Brenton Grills Entered By: Brenton Grills on 05/18/2023 12:02:29 -------------------------------------------------------------------------------- Debridement Details Patient Name: Date of Service: [name], [name] [place] E. 05/18/2023 11:00 A M Medical Record Number: [id number] Patient Account Number: [id number] Date of Birth/Sex: Treating RN: 22-Aug-1975 (47 y.o. [name] Primary Care Provider: Dorinda [name] Other Clinician: Referring Provider: Treating Provider/Extender: [name] in Treatment: 148 Debridement Performed for Assessment: Wound #2 Left Calcaneus Performed By: Physician [name], MD The following information was scribed by: Brenton Grills The information was scribed for: [name] Debridement Type: Debridement Level of Consciousness (Pre-procedure): Awake and Alert Pre-procedure Verification/Time Out Yes - 12:00 Taken: Start Time: 12:01 Pain Control: Lidocaine 4% T opical Solution Percent of Wound Bed Debrided: 100% T Area Debrided (cm): otal 2.2 Tissue and other material debrided: Viable, Callus, Slough, Slough Level: Non-Viable Tissue Debridement Description: Selective/Open Wound Instrument: Curette Bleeding: Minimum Hemostasis Achieved: Pressure Response [name] Treatment: Procedure was tolerated well Level of Consciousness (Post- Awake and Alert procedure): Post Debridement Measurements of Total Wound Length: (cm)  2.8 Stage: Category/Stage III Width: (cm) 1 Depth: (cm) 0.1 Volume: (cm) 0.22 Character of Wound/Ulcer Post Debridement: Improved Post Procedure Diagnosis Same as Pre-procedure Electronic Signature(s) Signed: 05/18/2023 1:10:39 PM By: [name] MD FACS Signed: 05/18/2023 4:20:59 PM By: Brenton Grills Entered By: Brenton Grills on 05/18/2023 12:03:13 -------------------------------------------------------------------------------- HPI Details Patient Name: Date of Service: [name], [name] [place] E. 05/18/2023 11:00 A M Medical Record Number: [id number] Patient Account Number: [id number] Date of Birth/Sex: Treating RN: 07-Jan-1976 (47 y.o. M) Primary Care Provider: Dorinda [name] Other Clinician: [name] ([id number]) [id number] Page 3 of 19 Referring Provider: Treating Provider/Extender: [name]  [name], [name] ([id number]) [id number] Page 1 of 19 Visit Report for 05/18/2023 Chief Complaint Document Details Patient Name: Date of Service: [name], [name] [name] 05/18/2023 11:00 A M Medical Record Number: [id number] Patient Account Number: [id number] Date of Birth/Sex: Treating RN: 10-Jan-1976 (47 y.o. M) Primary Care Provider: Dorinda [name] Other Clinician: Referring Provider: Treating Provider/Extender: [name] in Treatment: 148 Information Obtained from: Patient Chief Complaint Bilateral Plantar Foot Ulcers Electronic Signature(s) Signed: 05/18/2023 12:44:33 PM By: [name] MD FACS Entered By: [name] on 05/18/2023 12:44:32 -------------------------------------------------------------------------------- Debridement Details Patient Name: Date of Service: [name], [name] [place] E. 05/18/2023 11:00 A M Medical Record Number: [id number] Patient Account Number: [id number] Date of Birth/Sex: Treating RN: [birth year] (47 y.o. [name] Primary Care Provider: Dorinda [name] Other Clinician: Referring Provider: Treating Provider/Extender: [name] in Treatment: 148 Debridement Performed for Assessment: Wound #5 Right,Plantar Foot Performed By: Physician [name], MD The following information was scribed by: Brenton Grills The information was scribed for: [name] Debridement Type: Debridement Level of Consciousness (Pre-procedure): Awake and Alert Pre-procedure Verification/Time Out Yes - 12:00 Taken: Start Time: 12:01 Pain Control: Lidocaine 4% T opical Solution Percent of Wound Bed Debrided: 100% T Area Debrided (cm): otal 0.08 Tissue and other material debrided: Viable, Callus, Slough, Slough Level: Non-Viable Tissue Debridement Description: Selective/Open Wound Instrument: Curette Bleeding: Minimum Hemostasis Achieved: Pressure Response [name] Treatment: Procedure was  tolerated well Level of Consciousness (Post- Awake and Alert procedure): Post Debridement Measurements of Total Wound Length: (cm) 1 Stage: Category/Stage II Width: (cm) 0.1 Depth: (cm) 0.2 Volume: (cm) 0.016 Character of Wound/Ulcer Post Debridement: Improved Post Procedure Diagnosis [name], [name] ([id number]) [id number] Page 2 of 19 Same as Pre-procedure Electronic Signature(s) Signed: 05/18/2023 1:10:39 PM By: [name] MD FACS Signed: 05/18/2023 4:20:59 PM By: Brenton Grills Entered By: Brenton Grills on 05/18/2023 12:02:29 -------------------------------------------------------------------------------- Debridement Details Patient Name: Date of Service: [name], [name] [place] E. 05/18/2023 11:00 A M Medical Record Number: [id number] Patient Account Number: [id number] Date of Birth/Sex: Treating RN: 22-Aug-1975 (47 y.o. [name] Primary Care Provider: Dorinda [name] Other Clinician: Referring Provider: Treating Provider/Extender: [name] in Treatment: 148 Debridement Performed for Assessment: Wound #2 Left Calcaneus Performed By: Physician [name], MD The following information was scribed by: Brenton Grills The information was scribed for: [name] Debridement Type: Debridement Level of Consciousness (Pre-procedure): Awake and Alert Pre-procedure Verification/Time Out Yes - 12:00 Taken: Start Time: 12:01 Pain Control: Lidocaine 4% T opical Solution Percent of Wound Bed Debrided: 100% T Area Debrided (cm): otal 2.2 Tissue and other material debrided: Viable, Callus, Slough, Slough Level: Non-Viable Tissue Debridement Description: Selective/Open Wound Instrument: Curette Bleeding: Minimum Hemostasis Achieved: Pressure Response [name] Treatment: Procedure was tolerated well Level of Consciousness (Post- Awake and Alert procedure): Post Debridement Measurements of Total Wound Length: (cm)  2.8 Stage: Category/Stage III Width: (cm) 1 Depth: (cm) 0.1 Volume: (cm) 0.22 Character of Wound/Ulcer Post Debridement: Improved Post Procedure Diagnosis Same as Pre-procedure Electronic Signature(s) Signed: 05/18/2023 1:10:39 PM By: [name] MD FACS Signed: 05/18/2023 4:20:59 PM By: Brenton Grills Entered By: Brenton Grills on 05/18/2023 12:03:13 -------------------------------------------------------------------------------- HPI Details Patient Name: Date of Service: [name], [name] [place] E. 05/18/2023 11:00 A M Medical Record Number: [id number] Patient Account Number: [id number] Date of Birth/Sex: Treating RN: 07-Jan-1976 (47 y.o. M) Primary Care Provider: Dorinda [name] Other Clinician: [name] ([id number]) [id number] Page 3 of 19 Referring Provider: Treating Provider/Extender: [name]  keep pressure off of the bottom of your feet. Elevate legs throughout the day. Use the Shoe with the PegAssist off-loading insole Additional Orders / Instructions Follow Nutritious Diet - Try [name] get 70-100 grams of Protein a day+ Other: - 500 mg per day [name] start, then increase [name] 500mg  twice a day. Also consider adding a zinc supplement. Juven Shake 1-2 times daily. Wound Treatment Wound #2 - Calcaneus Wound Laterality: Left Cleanser: Normal Saline (Generic) 1 x Per Day/Other:until healed Discharge Instructions: Cleanse the wound with Normal Saline prior [name] applying a clean dressing using gauze sponges, not tissue or cotton balls. Cleanser: Wound Cleanser (Generic) 1 x Per Day/Other:until healed Discharge Instructions: Cleanse the wound with wound cleanser prior [name]  applying a clean dressing using gauze sponges, not tissue or cotton balls. Peri-Wound Care: Ketoconazole Cream 2% (Generic) 1 x Per Day/Other:until healed Discharge Instructions: Apply Ketoconazole mixed with zinc [name] the periwound Peri-Wound Care: Zinc Oxide Ointment 30g tube (Generic) 1 x Per Day/Other:until healed Discharge Instructions: Apply Zinc Oxide [name] periwound with each dressing change Topical: Gentamicin (Generic) 1 x Per Day/Other:until healed Discharge Instructions: apply [name] the wound bed Prim Dressing: Maxorb Extra Ag+ Alginate Dressing, 4x4.75 (in/in) (Generic) 1 x Per Day/Other:until healed ary Discharge Instructions: Apply [name] wound bed as instructed Secondary Dressing: Drawtex 4x4 in (Generic) 1 x Per Day/Other:until healed Discharge Instructions: Apply over primary dressing as directed. Secondary Dressing: Optifoam Non-Adhesive Dressing, 4x4 in (Generic) 1 x Per Day/Other:until healed Discharge Instructions: Apply over primary dressing as directed. Secondary Dressing: Woven Gauze Sponge, Non-Sterile 4x4 in (Generic) 1 x Per Day/Other:until healed Discharge Instructions: Apply over primary dressing as directed. Secured With: 14M Med[employer] H Soft Cloth Surgical T ape, 4 x 10 (in/yd) (Generic) 1 x Per Day/Other:until healed Discharge Instructions: Secure with tape as directed. Compression Wrap: Kerlix Roll 4.5x3.1 (in/yd) (Generic) 1 x Per Day/Other:until healed Discharge Instructions: Apply Kerlix and Coban compression as directed. [name], [name] ([id number]) [id number] Page 9 of 19 Add-Ons: [employer] Offloading Boot, Size Regular 1 x Per Day/Other:until healed Wound #5 - Foot Wound Laterality: Plantar, Right Topical: Gentamicin 1 x Per Day/30 Days Discharge Instructions: As directed by physician Prim Dressing: Maxorb Extra Ag+ Alginate Dressing, 4x4.75 (in/in) 1 x Per Day/30 Days ary Discharge Instructions: Apply [name] wound bed as  instructed Secondary Dressing: Drawtex 4x4 in 1 x Per Day/30 Days Discharge Instructions: Apply over primary dressing as directed. Secondary Dressing: Optifoam Non-Adhesive Dressing, 4x4 in 1 x Per Day/30 Days Discharge Instructions: Apply over primary dressing as directed. Secured With: [employer], 4.5x3.1 (in/yd) 1 x Per Day/30 Days Discharge Instructions: Secure with Kerlix as directed. Electronic Signature(s) Signed: 05/18/2023 1:10:39 PM By: [name] MD FACS Entered By: [name] on 05/18/2023 12:48:07 -------------------------------------------------------------------------------- Problem List Details Patient Name: Date of Service: [name], [name] [place] E. 05/18/2023 11:00 A M Medical Record Number: [id number] Patient Account Number: [id number] Date of Birth/Sex: Treating RN: 09/08/1975 (47 y.o. [name] Primary Care Provider: Dorinda [name] Other Clinician: Referring Provider: Treating Provider/Extender: [name] in Treatment: [id number] Active Problems ICD-10 Encounter Code Description Active Date MDM Diagnosis L97.512 Non-pressure chronic ulcer of other part of right foot with fat layer exposed 04/27/2023 No Yes L97.522 Non-pressure chronic ulcer of other part of left foot with fat layer exposed 09/03/2020 No Yes Inactive Problems ICD-10 Code Description Active Date Inactive Date L89.893 Pressure ulcer of other site, stage 3 07/15/2020 07/15/2020 M62.81 Muscle weakness (generalized) 07/15/2020 07/15/2020 I10 Essential (primary)  [name], [name] ([id number]) [id number] Page 1 of 19 Visit Report for 05/18/2023 Chief Complaint Document Details Patient Name: Date of Service: [name], [name] [name] 05/18/2023 11:00 A M Medical Record Number: [id number] Patient Account Number: [id number] Date of Birth/Sex: Treating RN: 10-Jan-1976 (47 y.o. M) Primary Care Provider: Dorinda [name] Other Clinician: Referring Provider: Treating Provider/Extender: [name] in Treatment: 148 Information Obtained from: Patient Chief Complaint Bilateral Plantar Foot Ulcers Electronic Signature(s) Signed: 05/18/2023 12:44:33 PM By: [name] MD FACS Entered By: [name] on 05/18/2023 12:44:32 -------------------------------------------------------------------------------- Debridement Details Patient Name: Date of Service: [name], [name] [place] E. 05/18/2023 11:00 A M Medical Record Number: [id number] Patient Account Number: [id number] Date of Birth/Sex: Treating RN: [birth year] (47 y.o. [name] Primary Care Provider: Dorinda [name] Other Clinician: Referring Provider: Treating Provider/Extender: [name] in Treatment: 148 Debridement Performed for Assessment: Wound #5 Right,Plantar Foot Performed By: Physician [name], MD The following information was scribed by: Brenton Grills The information was scribed for: [name] Debridement Type: Debridement Level of Consciousness (Pre-procedure): Awake and Alert Pre-procedure Verification/Time Out Yes - 12:00 Taken: Start Time: 12:01 Pain Control: Lidocaine 4% T opical Solution Percent of Wound Bed Debrided: 100% T Area Debrided (cm): otal 0.08 Tissue and other material debrided: Viable, Callus, Slough, Slough Level: Non-Viable Tissue Debridement Description: Selective/Open Wound Instrument: Curette Bleeding: Minimum Hemostasis Achieved: Pressure Response [name] Treatment: Procedure was  tolerated well Level of Consciousness (Post- Awake and Alert procedure): Post Debridement Measurements of Total Wound Length: (cm) 1 Stage: Category/Stage II Width: (cm) 0.1 Depth: (cm) 0.2 Volume: (cm) 0.016 Character of Wound/Ulcer Post Debridement: Improved Post Procedure Diagnosis [name], [name] ([id number]) [id number] Page 2 of 19 Same as Pre-procedure Electronic Signature(s) Signed: 05/18/2023 1:10:39 PM By: [name] MD FACS Signed: 05/18/2023 4:20:59 PM By: Brenton Grills Entered By: Brenton Grills on 05/18/2023 12:02:29 -------------------------------------------------------------------------------- Debridement Details Patient Name: Date of Service: [name], [name] [place] E. 05/18/2023 11:00 A M Medical Record Number: [id number] Patient Account Number: [id number] Date of Birth/Sex: Treating RN: 22-Aug-1975 (47 y.o. [name] Primary Care Provider: Dorinda [name] Other Clinician: Referring Provider: Treating Provider/Extender: [name] in Treatment: 148 Debridement Performed for Assessment: Wound #2 Left Calcaneus Performed By: Physician [name], MD The following information was scribed by: Brenton Grills The information was scribed for: [name] Debridement Type: Debridement Level of Consciousness (Pre-procedure): Awake and Alert Pre-procedure Verification/Time Out Yes - 12:00 Taken: Start Time: 12:01 Pain Control: Lidocaine 4% T opical Solution Percent of Wound Bed Debrided: 100% T Area Debrided (cm): otal 2.2 Tissue and other material debrided: Viable, Callus, Slough, Slough Level: Non-Viable Tissue Debridement Description: Selective/Open Wound Instrument: Curette Bleeding: Minimum Hemostasis Achieved: Pressure Response [name] Treatment: Procedure was tolerated well Level of Consciousness (Post- Awake and Alert procedure): Post Debridement Measurements of Total Wound Length: (cm)  2.8 Stage: Category/Stage III Width: (cm) 1 Depth: (cm) 0.1 Volume: (cm) 0.22 Character of Wound/Ulcer Post Debridement: Improved Post Procedure Diagnosis Same as Pre-procedure Electronic Signature(s) Signed: 05/18/2023 1:10:39 PM By: [name] MD FACS Signed: 05/18/2023 4:20:59 PM By: Brenton Grills Entered By: Brenton Grills on 05/18/2023 12:03:13 -------------------------------------------------------------------------------- HPI Details Patient Name: Date of Service: [name], [name] [place] E. 05/18/2023 11:00 A M Medical Record Number: [id number] Patient Account Number: [id number] Date of Birth/Sex: Treating RN: 07-Jan-1976 (47 y.o. M) Primary Care Provider: Dorinda [name] Other Clinician: [name] ([id number]) [id number] Page 3 of 19 Referring Provider: Treating Provider/Extender: [name]  of slough on the surface and minimal eschar around the edges. 03/15/2023: The wound measured narrower today. There is some slough accumulation on the surface and some eschar around the edges. 03/23/2023: The length of the wound remains stable, but it continues [name] narrow. The skin that has covered the posterior aspect of the calcaneus is more robust and has better tensile integrity. Minimal slough and eschar accumulation. 03/30/2023: The wound length has decreased. This seems [name] be primarily due [name] more epithelium covering the posterior aspect of the site. Minimal slough and eschar buildup. 04/07/2023: The wound measured a little bit smaller again today. There is minimal slough accumulation with just a little bit of eschar and callus around the edges. 04/13/2023: The lateral aspect of his wound is starting [name] epithelialize. There is very minimal slough on the surface. Moisture control is good. 04/20/2023: Continued, albeit extremely slow, encroachment of  epithelium as of the wound surface. Minimal slough. Moisture balance is good. 04/27/2023: More epithelium is filling in from the heel area. There is a little bit of slough on the surface with some periwound callus and eschar. He did note a little bit of drainage coming from his right foot and there is a small crack in the skin extending medially from the main site of his prior ulceration. 05/04/2023: The wounds are stable this week. No significant change. There is a little bit of callus buildup around the edges of each site and some thin slough on the surface on the left foot. 05/11/2023: The skin on the right heel is filling in further. There is a little bit of slough accumulation on both surfaces along with periwound callus accumulation. 05/18/2023: Both wounds are smaller today. The right foot is down [name] just a sliver of opening. Electronic Signature(s) Signed: 05/18/2023 12:46:11 PM By: [name] MD FACS Entered By: [name] on 05/18/2023 12:46:11 -------------------------------------------------------------------------------- Physical Exam Details Patient Name: Date of Service: [name], [name] [place] E. 05/18/2023 11:00 A M Medical Record Number: [id number] Patient Account Number: [id number] Date of Birth/Sex: Treating RN: 1976/03/13 (47 y.o. M) Primary Care Provider: Dorinda [name] Other Clinician: Referring Provider: Treating Provider/Extender: [name] in Treatment: 148 Constitutional . . . . no acute distress. Respiratory Normal work of breathing on room air. Notes 05/18/2023: Both wounds are smaller today. The right foot is down [name] just a sliver of opening. Electronic Signature(s) Signed: 05/18/2023 12:47:21 PM By: [name] MD FACS Entered By: [name] on 05/18/2023 12:47:20 [name] ([id number]) [id number] Page 8 of  19 -------------------------------------------------------------------------------- Physician Orders Details Patient Name: Date of Service: [name] [name] 05/18/2023 11:00 A M Medical Record Number: [id number] Patient Account Number: [id number] Date of Birth/Sex: Treating RN: Aug 02, 1976 (47 y.o. [name] Primary Care Provider: Dorinda [name] Other Clinician: Referring Provider: Treating Provider/Extender: [name] in Treatment: [phone] Verbal / Phone Orders: No Diagnosis Coding ICD-10 Coding Code Description [phone] Non-pressure chronic ulcer of other part of right foot with fat layer exposed L97.522 Non-pressure chronic ulcer of other part of left foot with fat layer exposed Follow-up Appointments ppointment in 1 week. - Dr. Lady Gary Room 3 - Return A Anesthetic Wound #2 Left Calcaneus (In clinic) Topical Lidocaine 4% applied [name] wound bed - In clinic Bathing/ Shower/ Hygiene May shower and wash wound with soap and water. Edema Control - Lymphedema / SCD / Other Bilateral Lower Extremities Avoid standing for long periods of time. Moisturize legs daily. - as needed Off-Loading Other: -  keep pressure off of the bottom of your feet. Elevate legs throughout the day. Use the Shoe with the PegAssist off-loading insole Additional Orders / Instructions Follow Nutritious Diet - Try [name] get 70-100 grams of Protein a day+ Other: - 500 mg per day [name] start, then increase [name] 500mg  twice a day. Also consider adding a zinc supplement. Juven Shake 1-2 times daily. Wound Treatment Wound #2 - Calcaneus Wound Laterality: Left Cleanser: Normal Saline (Generic) 1 x Per Day/Other:until healed Discharge Instructions: Cleanse the wound with Normal Saline prior [name] applying a clean dressing using gauze sponges, not tissue or cotton balls. Cleanser: Wound Cleanser (Generic) 1 x Per Day/Other:until healed Discharge Instructions: Cleanse the wound with wound cleanser prior [name]  applying a clean dressing using gauze sponges, not tissue or cotton balls. Peri-Wound Care: Ketoconazole Cream 2% (Generic) 1 x Per Day/Other:until healed Discharge Instructions: Apply Ketoconazole mixed with zinc [name] the periwound Peri-Wound Care: Zinc Oxide Ointment 30g tube (Generic) 1 x Per Day/Other:until healed Discharge Instructions: Apply Zinc Oxide [name] periwound with each dressing change Topical: Gentamicin (Generic) 1 x Per Day/Other:until healed Discharge Instructions: apply [name] the wound bed Prim Dressing: Maxorb Extra Ag+ Alginate Dressing, 4x4.75 (in/in) (Generic) 1 x Per Day/Other:until healed ary Discharge Instructions: Apply [name] wound bed as instructed Secondary Dressing: Drawtex 4x4 in (Generic) 1 x Per Day/Other:until healed Discharge Instructions: Apply over primary dressing as directed. Secondary Dressing: Optifoam Non-Adhesive Dressing, 4x4 in (Generic) 1 x Per Day/Other:until healed Discharge Instructions: Apply over primary dressing as directed. Secondary Dressing: Woven Gauze Sponge, Non-Sterile 4x4 in (Generic) 1 x Per Day/Other:until healed Discharge Instructions: Apply over primary dressing as directed. Secured With: 14M Medipore H Soft Cloth Surgical T ape, 4 x 10 (in/yd) (Generic) 1 x Per Day/Other:until healed Discharge Instructions: Secure with tape as directed. Compression Wrap: Kerlix Roll 4.5x3.1 (in/yd) (Generic) 1 x Per Day/Other:until healed Discharge Instructions: Apply Kerlix and Coban compression as directed. [name], [name] ([id number]) [id number] Page 9 of 19 Add-Ons: [employer] Offloading Boot, Size Regular 1 x Per Day/Other:until healed Wound #5 - Foot Wound Laterality: Plantar, Right Topical: Gentamicin 1 x Per Day/30 Days Discharge Instructions: As directed by physician Prim Dressing: Maxorb Extra Ag+ Alginate Dressing, 4x4.75 (in/in) 1 x Per Day/30 Days ary Discharge Instructions: Apply [name] wound bed as  instructed Secondary Dressing: Drawtex 4x4 in 1 x Per Day/30 Days Discharge Instructions: Apply over primary dressing as directed. Secondary Dressing: Optifoam Non-Adhesive Dressing, 4x4 in 1 x Per Day/30 Days Discharge Instructions: Apply over primary dressing as directed. Secured With: [employer], 4.5x3.1 (in/yd) 1 x Per Day/30 Days Discharge Instructions: Secure with Kerlix as directed. Electronic Signature(s) Signed: 05/18/2023 1:10:39 PM By: [name] MD FACS Entered By: [name] on 05/18/2023 12:48:07 -------------------------------------------------------------------------------- Problem List Details Patient Name: Date of Service: [name], [name] [place] E. 05/18/2023 11:00 A M Medical Record Number: [id number] Patient Account Number: [id number] Date of Birth/Sex: Treating RN: 09/08/1975 (47 y.o. [name] Primary Care Provider: Dorinda [name] Other Clinician: Referring Provider: Treating Provider/Extender: [name] in Treatment: [id number] Active Problems ICD-10 Encounter Code Description Active Date MDM Diagnosis L97.512 Non-pressure chronic ulcer of other part of right foot with fat layer exposed 04/27/2023 No Yes L97.522 Non-pressure chronic ulcer of other part of left foot with fat layer exposed 09/03/2020 No Yes Inactive Problems ICD-10 Code Description Active Date Inactive Date L89.893 Pressure ulcer of other site, stage 3 07/15/2020 07/15/2020 M62.81 Muscle weakness (generalized) 07/15/2020 07/15/2020 I10 Essential (primary)  hypertension 07/15/2020 07/15/2020 M86.171 Other acute osteomyelitis, right ankle and foot 09/03/2020 09/03/2020 Resolved Problems [name], [name] ([id number]) [phone].pdf Page 10 of 19 Electronic Signature(s) Signed: 05/18/2023 12:43:17 PM By: [name] MD FACS Entered By: [name] on 05/18/2023  12:43:17 -------------------------------------------------------------------------------- Progress Note Details Patient Name: Date of Service: [name], [name] [name] 05/18/2023 11:00 A M Medical Record Number: [id number] Patient Account Number: [id number] Date of Birth/Sex: Treating RN: 06-Sep-1975 (47 y.o. M) Primary Care Provider: Dorinda [name] Other Clinician: Referring Provider: Treating Provider/Extender: [name] in Treatment: 148 Subjective Chief Complaint Information obtained from Patient Bilateral Plantar Foot Ulcers History of Present Illness (HPI) Wounds are12/03/2020 upon evaluation today patient presents for initial inspection here in our clinic concerning issues he has been having with the bottoms of his feet bilaterally. He states these actually occurred as wounds when he was hospitalized for 5 months secondary [name] Covid. He was apparently with tilting bed where he was in an upright position quite frequently and apparently this occurred in some way shape or form during that time. Fortunately there is no sign of active infection at this time. No fevers, chills, nausea, vomiting, or diarrhea. With that being said he still has substantial wounds on the plantar aspects of his feet Theragen require quite a bit of work [name] get these [name] heal. He has been using Santyl currently though that is been problematic both in receiving the medication as well as actually paid for it as it is become quite expensive. Prior [name] the experience with Covid the patient really did not have any major medical problems other than hypertension he does have some mild generalized weakness following the Covid experience. 07/22/2020 on evaluation today patient appears [name] be doing okay in regard [name] his foot ulcers I feel like the wound beds are showing signs of better improvement that I do believe the Iodoflex is helping in this regard. With that being said he does have a lot of drainage currently  and this is somewhat blue/green in nature which is consistent with Pseudomonas. I do think a culture today would be appropriate for [place] [name] evaluate and see if that is indeed the case I would likely start him on antibiotic orally as well he is not allergic [name] Cipro knows of no issues he has had in the past 12/21; patient was admitted [name] the clinic earlier this month with bilateral presumed pressure ulcers on the bottom of his feet apparently related [name] excessive pressure from a tilt table arrangement in the intensive care unit. Patient relates this [name] being on ECMO but I am not really sure that is exactly related [name] that. I must say I have never seen anything like this. He has fairly extensive full-thickness wounds extending from his heel towards his midfoot mostly centered laterally. There is already been some healing distally. He does not appear [name] have an arterial issue. He has been using gentamicin [name] the wound surfaces with Iodoflex [name] help with ongoing debridement 1/6; this is a patient with pressure ulcers on the bottom of his feet related [name] excessive pressure from a standing position in the intensive care unit. He is complaining of a lot of pain in the right heel. He is not a diabetic. 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 We have been using Iodoflex [name] help prepare the surfaces of both wounds for an advanced treatment product. He is nonambulatory spending most of his time in a wheelchair I have asked  keep pressure off of the bottom of your feet. Elevate legs throughout the day. Use the Shoe with the PegAssist off-loading insole Additional Orders / Instructions Follow Nutritious Diet - Try [name] get 70-100 grams of Protein a day+ Other: - 500 mg per day [name] start, then increase [name] 500mg  twice a day. Also consider adding a zinc supplement. Juven Shake 1-2 times daily. Wound Treatment Wound #2 - Calcaneus Wound Laterality: Left Cleanser: Normal Saline (Generic) 1 x Per Day/Other:until healed Discharge Instructions: Cleanse the wound with Normal Saline prior [name] applying a clean dressing using gauze sponges, not tissue or cotton balls. Cleanser: Wound Cleanser (Generic) 1 x Per Day/Other:until healed Discharge Instructions: Cleanse the wound with wound cleanser prior [name]  applying a clean dressing using gauze sponges, not tissue or cotton balls. Peri-Wound Care: Ketoconazole Cream 2% (Generic) 1 x Per Day/Other:until healed Discharge Instructions: Apply Ketoconazole mixed with zinc [name] the periwound Peri-Wound Care: Zinc Oxide Ointment 30g tube (Generic) 1 x Per Day/Other:until healed Discharge Instructions: Apply Zinc Oxide [name] periwound with each dressing change Topical: Gentamicin (Generic) 1 x Per Day/Other:until healed Discharge Instructions: apply [name] the wound bed Prim Dressing: Maxorb Extra Ag+ Alginate Dressing, 4x4.75 (in/in) (Generic) 1 x Per Day/Other:until healed ary Discharge Instructions: Apply [name] wound bed as instructed Secondary Dressing: Drawtex 4x4 in (Generic) 1 x Per Day/Other:until healed Discharge Instructions: Apply over primary dressing as directed. Secondary Dressing: Optifoam Non-Adhesive Dressing, 4x4 in (Generic) 1 x Per Day/Other:until healed Discharge Instructions: Apply over primary dressing as directed. Secondary Dressing: Woven Gauze Sponge, Non-Sterile 4x4 in (Generic) 1 x Per Day/Other:until healed Discharge Instructions: Apply over primary dressing as directed. Secured With: 14M Medipore H Soft Cloth Surgical T ape, 4 x 10 (in/yd) (Generic) 1 x Per Day/Other:until healed Discharge Instructions: Secure with tape as directed. Compression Wrap: Kerlix Roll 4.5x3.1 (in/yd) (Generic) 1 x Per Day/Other:until healed Discharge Instructions: Apply Kerlix and Coban compression as directed. [name], [name] ([id number]) [id number] Page 9 of 19 Add-Ons: [employer] Offloading Boot, Size Regular 1 x Per Day/Other:until healed Wound #5 - Foot Wound Laterality: Plantar, Right Topical: Gentamicin 1 x Per Day/30 Days Discharge Instructions: As directed by physician Prim Dressing: Maxorb Extra Ag+ Alginate Dressing, 4x4.75 (in/in) 1 x Per Day/30 Days ary Discharge Instructions: Apply [name] wound bed as  instructed Secondary Dressing: Drawtex 4x4 in 1 x Per Day/30 Days Discharge Instructions: Apply over primary dressing as directed. Secondary Dressing: Optifoam Non-Adhesive Dressing, 4x4 in 1 x Per Day/30 Days Discharge Instructions: Apply over primary dressing as directed. Secured With: [employer], 4.5x3.1 (in/yd) 1 x Per Day/30 Days Discharge Instructions: Secure with Kerlix as directed. Electronic Signature(s) Signed: 05/18/2023 1:10:39 PM By: [name] MD FACS Entered By: [name] on 05/18/2023 12:48:07 -------------------------------------------------------------------------------- Problem List Details Patient Name: Date of Service: [name], [name] [place] E. 05/18/2023 11:00 A M Medical Record Number: [id number] Patient Account Number: [id number] Date of Birth/Sex: Treating RN: 09/08/1975 (47 y.o. [name] Primary Care Provider: Dorinda [name] Other Clinician: Referring Provider: Treating Provider/Extender: [name] in Treatment: [id number] Active Problems ICD-10 Encounter Code Description Active Date MDM Diagnosis L97.512 Non-pressure chronic ulcer of other part of right foot with fat layer exposed 04/27/2023 No Yes L97.522 Non-pressure chronic ulcer of other part of left foot with fat layer exposed 09/03/2020 No Yes Inactive Problems ICD-10 Code Description Active Date Inactive Date L89.893 Pressure ulcer of other site, stage 3 07/15/2020 07/15/2020 M62.81 Muscle weakness (generalized) 07/15/2020 07/15/2020 I10 Essential (primary)  hypertension 07/15/2020 07/15/2020 M86.171 Other acute osteomyelitis, right ankle and foot 09/03/2020 09/03/2020 Resolved Problems [name], [name] ([id number]) [phone].pdf Page 10 of 19 Electronic Signature(s) Signed: 05/18/2023 12:43:17 PM By: [name] MD FACS Entered By: [name] on 05/18/2023  12:43:17 -------------------------------------------------------------------------------- Progress Note Details Patient Name: Date of Service: [name], [name] [name] 05/18/2023 11:00 A M Medical Record Number: [id number] Patient Account Number: [id number] Date of Birth/Sex: Treating RN: 06-Sep-1975 (47 y.o. M) Primary Care Provider: Dorinda [name] Other Clinician: Referring Provider: Treating Provider/Extender: [name] in Treatment: 148 Subjective Chief Complaint Information obtained from Patient Bilateral Plantar Foot Ulcers History of Present Illness (HPI) Wounds are12/03/2020 upon evaluation today patient presents for initial inspection here in our clinic concerning issues he has been having with the bottoms of his feet bilaterally. He states these actually occurred as wounds when he was hospitalized for 5 months secondary [name] Covid. He was apparently with tilting bed where he was in an upright position quite frequently and apparently this occurred in some way shape or form during that time. Fortunately there is no sign of active infection at this time. No fevers, chills, nausea, vomiting, or diarrhea. With that being said he still has substantial wounds on the plantar aspects of his feet Theragen require quite a bit of work [name] get these [name] heal. He has been using Santyl currently though that is been problematic both in receiving the medication as well as actually paid for it as it is become quite expensive. Prior [name] the experience with Covid the patient really did not have any major medical problems other than hypertension he does have some mild generalized weakness following the Covid experience. 07/22/2020 on evaluation today patient appears [name] be doing okay in regard [name] his foot ulcers I feel like the wound beds are showing signs of better improvement that I do believe the Iodoflex is helping in this regard. With that being said he does have a lot of drainage currently  and this is somewhat blue/green in nature which is consistent with Pseudomonas. I do think a culture today would be appropriate for [place] [name] evaluate and see if that is indeed the case I would likely start him on antibiotic orally as well he is not allergic [name] Cipro knows of no issues he has had in the past 12/21; patient was admitted [name] the clinic earlier this month with bilateral presumed pressure ulcers on the bottom of his feet apparently related [name] excessive pressure from a tilt table arrangement in the intensive care unit. Patient relates this [name] being on ECMO but I am not really sure that is exactly related [name] that. I must say I have never seen anything like this. He has fairly extensive full-thickness wounds extending from his heel towards his midfoot mostly centered laterally. There is already been some healing distally. He does not appear [name] have an arterial issue. He has been using gentamicin [name] the wound surfaces with Iodoflex [name] help with ongoing debridement 1/6; this is a patient with pressure ulcers on the bottom of his feet related [name] excessive pressure from a standing position in the intensive care unit. He is complaining of a lot of pain in the right heel. He is not a diabetic. 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 We have been using Iodoflex [name] help prepare the surfaces of both wounds for an advanced treatment product. He is nonambulatory spending most of his time in a wheelchair I have asked  [name], [name] ([id number]) [id number] Page 1 of 19 Visit Report for 05/18/2023 Chief Complaint Document Details Patient Name: Date of Service: [name], [name] [name] 05/18/2023 11:00 A M Medical Record Number: [id number] Patient Account Number: [id number] Date of Birth/Sex: Treating RN: 10-Jan-1976 (47 y.o. M) Primary Care Provider: Dorinda [name] Other Clinician: Referring Provider: Treating Provider/Extender: [name] in Treatment: 148 Information Obtained from: Patient Chief Complaint Bilateral Plantar Foot Ulcers Electronic Signature(s) Signed: 05/18/2023 12:44:33 PM By: [name] MD FACS Entered By: [name] on 05/18/2023 12:44:32 -------------------------------------------------------------------------------- Debridement Details Patient Name: Date of Service: [name], [name] [place] E. 05/18/2023 11:00 A M Medical Record Number: [id number] Patient Account Number: [id number] Date of Birth/Sex: Treating RN: [birth year] (47 y.o. [name] Primary Care Provider: Dorinda [name] Other Clinician: Referring Provider: Treating Provider/Extender: [name] in Treatment: 148 Debridement Performed for Assessment: Wound #5 Right,Plantar Foot Performed By: Physician [name], MD The following information was scribed by: Brenton Grills The information was scribed for: [name] Debridement Type: Debridement Level of Consciousness (Pre-procedure): Awake and Alert Pre-procedure Verification/Time Out Yes - 12:00 Taken: Start Time: 12:01 Pain Control: Lidocaine 4% T opical Solution Percent of Wound Bed Debrided: 100% T Area Debrided (cm): otal 0.08 Tissue and other material debrided: Viable, Callus, Slough, Slough Level: Non-Viable Tissue Debridement Description: Selective/Open Wound Instrument: Curette Bleeding: Minimum Hemostasis Achieved: Pressure Response [name] Treatment: Procedure was  tolerated well Level of Consciousness (Post- Awake and Alert procedure): Post Debridement Measurements of Total Wound Length: (cm) 1 Stage: Category/Stage II Width: (cm) 0.1 Depth: (cm) 0.2 Volume: (cm) 0.016 Character of Wound/Ulcer Post Debridement: Improved Post Procedure Diagnosis [name], [name] ([id number]) [id number] Page 2 of 19 Same as Pre-procedure Electronic Signature(s) Signed: 05/18/2023 1:10:39 PM By: [name] MD FACS Signed: 05/18/2023 4:20:59 PM By: Brenton Grills Entered By: Brenton Grills on 05/18/2023 12:02:29 -------------------------------------------------------------------------------- Debridement Details Patient Name: Date of Service: [name], [name] [place] E. 05/18/2023 11:00 A M Medical Record Number: [id number] Patient Account Number: [id number] Date of Birth/Sex: Treating RN: 22-Aug-1975 (47 y.o. [name] Primary Care Provider: Dorinda [name] Other Clinician: Referring Provider: Treating Provider/Extender: [name] in Treatment: 148 Debridement Performed for Assessment: Wound #2 Left Calcaneus Performed By: Physician [name], MD The following information was scribed by: Brenton Grills The information was scribed for: [name] Debridement Type: Debridement Level of Consciousness (Pre-procedure): Awake and Alert Pre-procedure Verification/Time Out Yes - 12:00 Taken: Start Time: 12:01 Pain Control: Lidocaine 4% T opical Solution Percent of Wound Bed Debrided: 100% T Area Debrided (cm): otal 2.2 Tissue and other material debrided: Viable, Callus, Slough, Slough Level: Non-Viable Tissue Debridement Description: Selective/Open Wound Instrument: Curette Bleeding: Minimum Hemostasis Achieved: Pressure Response [name] Treatment: Procedure was tolerated well Level of Consciousness (Post- Awake and Alert procedure): Post Debridement Measurements of Total Wound Length: (cm)  2.8 Stage: Category/Stage III Width: (cm) 1 Depth: (cm) 0.1 Volume: (cm) 0.22 Character of Wound/Ulcer Post Debridement: Improved Post Procedure Diagnosis Same as Pre-procedure Electronic Signature(s) Signed: 05/18/2023 1:10:39 PM By: [name] MD FACS Signed: 05/18/2023 4:20:59 PM By: Brenton Grills Entered By: Brenton Grills on 05/18/2023 12:03:13 -------------------------------------------------------------------------------- HPI Details Patient Name: Date of Service: [name], [name] [place] E. 05/18/2023 11:00 A M Medical Record Number: [id number] Patient Account Number: [id number] Date of Birth/Sex: Treating RN: 07-Jan-1976 (47 y.o. M) Primary Care Provider: Dorinda [name] Other Clinician: [name] ([id number]) [id number] Page 3 of 19 Referring Provider: Treating Provider/Extender: [name]  keep pressure off of the bottom of your feet. Elevate legs throughout the day. Use the Shoe with the PegAssist off-loading insole Additional Orders / Instructions Follow Nutritious Diet - Try [name] get 70-100 grams of Protein a day+ Other: - 500 mg per day [name] start, then increase [name] 500mg  twice a day. Also consider adding a zinc supplement. Juven Shake 1-2 times daily. Wound Treatment Wound #2 - Calcaneus Wound Laterality: Left Cleanser: Normal Saline (Generic) 1 x Per Day/Other:until healed Discharge Instructions: Cleanse the wound with Normal Saline prior [name] applying a clean dressing using gauze sponges, not tissue or cotton balls. Cleanser: Wound Cleanser (Generic) 1 x Per Day/Other:until healed Discharge Instructions: Cleanse the wound with wound cleanser prior [name]  applying a clean dressing using gauze sponges, not tissue or cotton balls. Peri-Wound Care: Ketoconazole Cream 2% (Generic) 1 x Per Day/Other:until healed Discharge Instructions: Apply Ketoconazole mixed with zinc [name] the periwound Peri-Wound Care: Zinc Oxide Ointment 30g tube (Generic) 1 x Per Day/Other:until healed Discharge Instructions: Apply Zinc Oxide [name] periwound with each dressing change Topical: Gentamicin (Generic) 1 x Per Day/Other:until healed Discharge Instructions: apply [name] the wound bed Prim Dressing: Maxorb Extra Ag+ Alginate Dressing, 4x4.75 (in/in) (Generic) 1 x Per Day/Other:until healed ary Discharge Instructions: Apply [name] wound bed as instructed Secondary Dressing: Drawtex 4x4 in (Generic) 1 x Per Day/Other:until healed Discharge Instructions: Apply over primary dressing as directed. Secondary Dressing: Optifoam Non-Adhesive Dressing, 4x4 in (Generic) 1 x Per Day/Other:until healed Discharge Instructions: Apply over primary dressing as directed. Secondary Dressing: Woven Gauze Sponge, Non-Sterile 4x4 in (Generic) 1 x Per Day/Other:until healed Discharge Instructions: Apply over primary dressing as directed. Secured With: 14M Medipore H Soft Cloth Surgical T ape, 4 x 10 (in/yd) (Generic) 1 x Per Day/Other:until healed Discharge Instructions: Secure with tape as directed. Compression Wrap: Kerlix Roll 4.5x3.1 (in/yd) (Generic) 1 x Per Day/Other:until healed Discharge Instructions: Apply Kerlix and Coban compression as directed. [name], [name] ([id number]) [id number] Page 9 of 19 Add-Ons: [employer] Offloading Boot, Size Regular 1 x Per Day/Other:until healed Wound #5 - Foot Wound Laterality: Plantar, Right Topical: Gentamicin 1 x Per Day/30 Days Discharge Instructions: As directed by physician Prim Dressing: Maxorb Extra Ag+ Alginate Dressing, 4x4.75 (in/in) 1 x Per Day/30 Days ary Discharge Instructions: Apply [name] wound bed as  instructed Secondary Dressing: Drawtex 4x4 in 1 x Per Day/30 Days Discharge Instructions: Apply over primary dressing as directed. Secondary Dressing: Optifoam Non-Adhesive Dressing, 4x4 in 1 x Per Day/30 Days Discharge Instructions: Apply over primary dressing as directed. Secured With: [employer], 4.5x3.1 (in/yd) 1 x Per Day/30 Days Discharge Instructions: Secure with Kerlix as directed. Electronic Signature(s) Signed: 05/18/2023 1:10:39 PM By: [name] MD FACS Entered By: [name] on 05/18/2023 12:48:07 -------------------------------------------------------------------------------- Problem List Details Patient Name: Date of Service: [name], [name] [place] E. 05/18/2023 11:00 A M Medical Record Number: [id number] Patient Account Number: [id number] Date of Birth/Sex: Treating RN: 09/08/1975 (47 y.o. [name] Primary Care Provider: Dorinda [name] Other Clinician: Referring Provider: Treating Provider/Extender: [name] in Treatment: [id number] Active Problems ICD-10 Encounter Code Description Active Date MDM Diagnosis L97.512 Non-pressure chronic ulcer of other part of right foot with fat layer exposed 04/27/2023 No Yes L97.522 Non-pressure chronic ulcer of other part of left foot with fat layer exposed 09/03/2020 No Yes Inactive Problems ICD-10 Code Description Active Date Inactive Date L89.893 Pressure ulcer of other site, stage 3 07/15/2020 07/15/2020 M62.81 Muscle weakness (generalized) 07/15/2020 07/15/2020 I10 Essential (primary)  keep pressure off of the bottom of your feet. Elevate legs throughout the day. Use the Shoe with the PegAssist off-loading insole Additional Orders / Instructions Follow Nutritious Diet - Try [name] get 70-100 grams of Protein a day+ Other: - 500 mg per day [name] start, then increase [name] 500mg  twice a day. Also consider adding a zinc supplement. Juven Shake 1-2 times daily. Wound Treatment Wound #2 - Calcaneus Wound Laterality: Left Cleanser: Normal Saline (Generic) 1 x Per Day/Other:until healed Discharge Instructions: Cleanse the wound with Normal Saline prior [name] applying a clean dressing using gauze sponges, not tissue or cotton balls. Cleanser: Wound Cleanser (Generic) 1 x Per Day/Other:until healed Discharge Instructions: Cleanse the wound with wound cleanser prior [name]  applying a clean dressing using gauze sponges, not tissue or cotton balls. Peri-Wound Care: Ketoconazole Cream 2% (Generic) 1 x Per Day/Other:until healed Discharge Instructions: Apply Ketoconazole mixed with zinc [name] the periwound Peri-Wound Care: Zinc Oxide Ointment 30g tube (Generic) 1 x Per Day/Other:until healed Discharge Instructions: Apply Zinc Oxide [name] periwound with each dressing change Topical: Gentamicin (Generic) 1 x Per Day/Other:until healed Discharge Instructions: apply [name] the wound bed Prim Dressing: Maxorb Extra Ag+ Alginate Dressing, 4x4.75 (in/in) (Generic) 1 x Per Day/Other:until healed ary Discharge Instructions: Apply [name] wound bed as instructed Secondary Dressing: Drawtex 4x4 in (Generic) 1 x Per Day/Other:until healed Discharge Instructions: Apply over primary dressing as directed. Secondary Dressing: Optifoam Non-Adhesive Dressing, 4x4 in (Generic) 1 x Per Day/Other:until healed Discharge Instructions: Apply over primary dressing as directed. Secondary Dressing: Woven Gauze Sponge, Non-Sterile 4x4 in (Generic) 1 x Per Day/Other:until healed Discharge Instructions: Apply over primary dressing as directed. Secured With: 14M Medipore H Soft Cloth Surgical T ape, 4 x 10 (in/yd) (Generic) 1 x Per Day/Other:until healed Discharge Instructions: Secure with tape as directed. Compression Wrap: Kerlix Roll 4.5x3.1 (in/yd) (Generic) 1 x Per Day/Other:until healed Discharge Instructions: Apply Kerlix and Coban compression as directed. [name], [name] ([id number]) [id number] Page 9 of 19 Add-Ons: [employer] Offloading Boot, Size Regular 1 x Per Day/Other:until healed Wound #5 - Foot Wound Laterality: Plantar, Right Topical: Gentamicin 1 x Per Day/30 Days Discharge Instructions: As directed by physician Prim Dressing: Maxorb Extra Ag+ Alginate Dressing, 4x4.75 (in/in) 1 x Per Day/30 Days ary Discharge Instructions: Apply [name] wound bed as  instructed Secondary Dressing: Drawtex 4x4 in 1 x Per Day/30 Days Discharge Instructions: Apply over primary dressing as directed. Secondary Dressing: Optifoam Non-Adhesive Dressing, 4x4 in 1 x Per Day/30 Days Discharge Instructions: Apply over primary dressing as directed. Secured With: [employer], 4.5x3.1 (in/yd) 1 x Per Day/30 Days Discharge Instructions: Secure with Kerlix as directed. Electronic Signature(s) Signed: 05/18/2023 1:10:39 PM By: [name] MD FACS Entered By: [name] on 05/18/2023 12:48:07 -------------------------------------------------------------------------------- Problem List Details Patient Name: Date of Service: [name], [name] [place] E. 05/18/2023 11:00 A M Medical Record Number: [id number] Patient Account Number: [id number] Date of Birth/Sex: Treating RN: 09/08/1975 (47 y.o. [name] Primary Care Provider: Dorinda [name] Other Clinician: Referring Provider: Treating Provider/Extender: [name] in Treatment: [id number] Active Problems ICD-10 Encounter Code Description Active Date MDM Diagnosis L97.512 Non-pressure chronic ulcer of other part of right foot with fat layer exposed 04/27/2023 No Yes L97.522 Non-pressure chronic ulcer of other part of left foot with fat layer exposed 09/03/2020 No Yes Inactive Problems ICD-10 Code Description Active Date Inactive Date L89.893 Pressure ulcer of other site, stage 3 07/15/2020 07/15/2020 M62.81 Muscle weakness (generalized) 07/15/2020 07/15/2020 I10 Essential (primary)  [name], [name] ([id number]) [id number] Page 1 of 19 Visit Report for 05/18/2023 Chief Complaint Document Details Patient Name: Date of Service: [name], [name] [name] 05/18/2023 11:00 A M Medical Record Number: [id number] Patient Account Number: [id number] Date of Birth/Sex: Treating RN: 10-Jan-1976 (47 y.o. M) Primary Care Provider: Dorinda [name] Other Clinician: Referring Provider: Treating Provider/Extender: [name] in Treatment: 148 Information Obtained from: Patient Chief Complaint Bilateral Plantar Foot Ulcers Electronic Signature(s) Signed: 05/18/2023 12:44:33 PM By: [name] MD FACS Entered By: [name] on 05/18/2023 12:44:32 -------------------------------------------------------------------------------- Debridement Details Patient Name: Date of Service: [name], [name] [place] E. 05/18/2023 11:00 A M Medical Record Number: [id number] Patient Account Number: [id number] Date of Birth/Sex: Treating RN: [birth year] (47 y.o. [name] Primary Care Provider: Dorinda [name] Other Clinician: Referring Provider: Treating Provider/Extender: [name] in Treatment: 148 Debridement Performed for Assessment: Wound #5 Right,Plantar Foot Performed By: Physician [name], MD The following information was scribed by: Brenton Grills The information was scribed for: [name] Debridement Type: Debridement Level of Consciousness (Pre-procedure): Awake and Alert Pre-procedure Verification/Time Out Yes - 12:00 Taken: Start Time: 12:01 Pain Control: Lidocaine 4% T opical Solution Percent of Wound Bed Debrided: 100% T Area Debrided (cm): otal 0.08 Tissue and other material debrided: Viable, Callus, Slough, Slough Level: Non-Viable Tissue Debridement Description: Selective/Open Wound Instrument: Curette Bleeding: Minimum Hemostasis Achieved: Pressure Response [name] Treatment: Procedure was  tolerated well Level of Consciousness (Post- Awake and Alert procedure): Post Debridement Measurements of Total Wound Length: (cm) 1 Stage: Category/Stage II Width: (cm) 0.1 Depth: (cm) 0.2 Volume: (cm) 0.016 Character of Wound/Ulcer Post Debridement: Improved Post Procedure Diagnosis [name], [name] ([id number]) [id number] Page 2 of 19 Same as Pre-procedure Electronic Signature(s) Signed: 05/18/2023 1:10:39 PM By: [name] MD FACS Signed: 05/18/2023 4:20:59 PM By: Brenton Grills Entered By: Brenton Grills on 05/18/2023 12:02:29 -------------------------------------------------------------------------------- Debridement Details Patient Name: Date of Service: [name], [name] [place] E. 05/18/2023 11:00 A M Medical Record Number: [id number] Patient Account Number: [id number] Date of Birth/Sex: Treating RN: 22-Aug-1975 (47 y.o. [name] Primary Care Provider: Dorinda [name] Other Clinician: Referring Provider: Treating Provider/Extender: [name] in Treatment: 148 Debridement Performed for Assessment: Wound #2 Left Calcaneus Performed By: Physician [name], MD The following information was scribed by: Brenton Grills The information was scribed for: [name] Debridement Type: Debridement Level of Consciousness (Pre-procedure): Awake and Alert Pre-procedure Verification/Time Out Yes - 12:00 Taken: Start Time: 12:01 Pain Control: Lidocaine 4% T opical Solution Percent of Wound Bed Debrided: 100% T Area Debrided (cm): otal 2.2 Tissue and other material debrided: Viable, Callus, Slough, Slough Level: Non-Viable Tissue Debridement Description: Selective/Open Wound Instrument: Curette Bleeding: Minimum Hemostasis Achieved: Pressure Response [name] Treatment: Procedure was tolerated well Level of Consciousness (Post- Awake and Alert procedure): Post Debridement Measurements of Total Wound Length: (cm)  2.8 Stage: Category/Stage III Width: (cm) 1 Depth: (cm) 0.1 Volume: (cm) 0.22 Character of Wound/Ulcer Post Debridement: Improved Post Procedure Diagnosis Same as Pre-procedure Electronic Signature(s) Signed: 05/18/2023 1:10:39 PM By: [name] MD FACS Signed: 05/18/2023 4:20:59 PM By: Brenton Grills Entered By: Brenton Grills on 05/18/2023 12:03:13 -------------------------------------------------------------------------------- HPI Details Patient Name: Date of Service: [name], [name] [place] E. 05/18/2023 11:00 A M Medical Record Number: [id number] Patient Account Number: [id number] Date of Birth/Sex: Treating RN: 07-Jan-1976 (47 y.o. M) Primary Care Provider: Dorinda [name] Other Clinician: [name] ([id number]) [id number] Page 3 of 19 Referring Provider: Treating Provider/Extender: [name]  wound bed as instructed Secondary Dressing: Drawtex 4x4 in 1 x Per Day/30 Days Discharge Instructions: Apply over primary dressing as directed. Secondary Dressing: Optifoam Non-Adhesive Dressing, 4x4 in 1 x Per Day/30 Days Discharge Instructions: Apply over primary dressing as directed. Secured With: [employer], 4.5x3.1 (in/yd) 1 x Per Day/30 Days Discharge Instructions: Secure with Kerlix as directed. 05/18/2023: Both wounds are smaller today. The right foot is down [name] just a sliver of opening. I used a curette [name] debride callus from the right foot and callus and slough from the left foot. We will continue topical gentamicin with silver alginate, drawtex, and foam donut offloading bilaterally. Follow-up in 1 week. [name], [name] ([id number]) [id number] Page 17 of 19 Electronic Signature(s) Signed: 05/18/2023 12:48:49 PM By: [name] MD FACS Entered By: [name] on 05/18/2023 12:48:49 -------------------------------------------------------------------------------- HxROS Details Patient Name: Date of Service: [name], [name] [place] E. 05/18/2023 11:00 A M Medical Record Number: [id number] Patient Account Number: [id number] Date of Birth/Sex: Treating RN: 1975/08/26 (47 y.o. M) Primary Care Provider: Dorinda [name] Other Clinician: Referring Provider: Treating Provider/Extender: [name] in Treatment:  148 Information Obtained From Patient Constitutional Symptoms (General Health) Medical History: Past Medical History Notes: COVID PNA 07/22/2019-11/14/2019 [facility] ECMO, foot drop left foot , Eyes Medical History: Negative for: Cataracts; Glaucoma; Optic Neuritis Ear/Nose/Mouth/Throat Medical History: Negative for: Chronic sinus problems/congestion; Middle ear problems Hematologic/Lymphatic Medical History: Negative for: Anemia; Hemophilia; Human Immunodeficiency Virus; Lymphedema; Sickle Cell Disease Respiratory Medical History: Positive for: Asthma Negative for: Aspiration; Chronic Obstructive Pulmonary Disease (COPD); Pneumothorax; Sleep Apnea; Tuberculosis Cardiovascular Medical History: Positive for: Angina - with COVID; Hypertension Negative for: Arrhythmia; Congestive Heart Failure; Coronary Artery Disease; Deep Vein Thrombosis; Hypotension; Myocardial Infarction; Peripheral Arterial Disease; Peripheral Venous Disease; Phlebitis; Vasculitis Gastrointestinal Medical History: Negative for: Cirrhosis ; Colitis; Crohns; Hepatitis A; Hepatitis B; Hepatitis C Endocrine Medical History: Negative for: Type I Diabetes; Type II Diabetes Genitourinary Medical History: Negative for: End Stage Renal Disease Past Medical History Notes: kidney stone Immunological Medical HistoryDMITRY, [name] ([id number]) [id number] Page 18 of 19 Negative for: Lupus Erythematosus; Raynauds; Scleroderma Integumentary (Skin) Medical History: Negative for: History of Burn Musculoskeletal Medical History: Negative for: Gout; Rheumatoid Arthritis; Osteoarthritis; Osteomyelitis Neurologic Medical History: Negative for: Dementia; Neuropathy; Quadriplegia; Paraplegia; Seizure Disorder Oncologic Medical History: Negative for: Received Chemotherapy; Received Radiation Psychiatric Medical History: Negative for: Anorexia/bulimia; Confinement Anxiety Past Medical History  Notes: anxiety Immunizations Pneumococcal Vaccine: Received Pneumococcal Vaccination: No Implantable Devices None Hospitalization / Surgery History Type of Hospitalization/Surgery COVID PNA 07/22/2019- 11/14/2019 03/27/2020 wound debridement/ skin graft Family and Social History Cancer: Yes - Maternal Grandparents; Diabetes: Yes - Father,Paternal Grandparents; Heart Disease: Yes - Maternal Grandparents; Hereditary Spherocytosis: No; Hypertension: Yes - Father,Paternal Grandparents; Kidney Disease: No; Lung Disease: Yes - Siblings; Seizures: No; Stroke: No; Thyroid Problems: No; Tuberculosis: No; Never smoker; Marital Status - Married; Alcohol Use: Never; Drug Use: No History; Caffeine Use: Daily - tea, soda; Financial Concerns: No; Food, Clothing or Shelter Needs: No; Support System Lacking: No; Transportation Concerns: No Psychologist, prison and probation services) Signed: 05/18/2023 1:10:39 PM By: [name] MD FACS Entered By: [name] on 05/18/2023 12:46:58 -------------------------------------------------------------------------------- SuperBill Details Patient Name: Date of Service: [name], [name] [name] 05/18/2023 Medical Record Number: [id number] Patient Account Number: [id number] Date of Birth/Sex: Treating RN: [birth year] (47 y.o. M) Primary Care Provider: Dorinda [name] Other Clinician: Referring Provider: Treating Provider/Extender: [name] in Treatment: 148 Diagnosis Coding ICD-10 Codes Code Description [phone] Non-pressure chronic  of slough on the surface and minimal eschar around the edges. 03/15/2023: The wound measured narrower today. There is some slough accumulation on the surface and some eschar around the edges. 03/23/2023: The length of the wound remains stable, but it continues [name] narrow. The skin that has covered the posterior aspect of the calcaneus is more robust and has better tensile integrity. Minimal slough and eschar accumulation. 03/30/2023: The wound length has decreased. This seems [name] be primarily due [name] more epithelium covering the posterior aspect of the site. Minimal slough and eschar buildup. 04/07/2023: The wound measured a little bit smaller again today. There is minimal slough accumulation with just a little bit of eschar and callus around the edges. 04/13/2023: The lateral aspect of his wound is starting [name] epithelialize. There is very minimal slough on the surface. Moisture control is good. 04/20/2023: Continued, albeit extremely slow, encroachment of  epithelium as of the wound surface. Minimal slough. Moisture balance is good. 04/27/2023: More epithelium is filling in from the heel area. There is a little bit of slough on the surface with some periwound callus and eschar. He did note a little bit of drainage coming from his right foot and there is a small crack in the skin extending medially from the main site of his prior ulceration. 05/04/2023: The wounds are stable this week. No significant change. There is a little bit of callus buildup around the edges of each site and some thin slough on the surface on the left foot. 05/11/2023: The skin on the right heel is filling in further. There is a little bit of slough accumulation on both surfaces along with periwound callus accumulation. 05/18/2023: Both wounds are smaller today. The right foot is down [name] just a sliver of opening. Electronic Signature(s) Signed: 05/18/2023 12:46:11 PM By: [name] MD FACS Entered By: [name] on 05/18/2023 12:46:11 -------------------------------------------------------------------------------- Physical Exam Details Patient Name: Date of Service: [name], [name] [place] E. 05/18/2023 11:00 A M Medical Record Number: [id number] Patient Account Number: [id number] Date of Birth/Sex: Treating RN: 1976/03/13 (47 y.o. M) Primary Care Provider: Dorinda [name] Other Clinician: Referring Provider: Treating Provider/Extender: [name] in Treatment: 148 Constitutional . . . . no acute distress. Respiratory Normal work of breathing on room air. Notes 05/18/2023: Both wounds are smaller today. The right foot is down [name] just a sliver of opening. Electronic Signature(s) Signed: 05/18/2023 12:47:21 PM By: [name] MD FACS Entered By: [name] on 05/18/2023 12:47:20 [name] ([id number]) [id number] Page 8 of  19 -------------------------------------------------------------------------------- Physician Orders Details Patient Name: Date of Service: [name] [name] 05/18/2023 11:00 A M Medical Record Number: [id number] Patient Account Number: [id number] Date of Birth/Sex: Treating RN: Aug 02, 1976 (47 y.o. [name] Primary Care Provider: Dorinda [name] Other Clinician: Referring Provider: Treating Provider/Extender: [name] in Treatment: [phone] Verbal / Phone Orders: No Diagnosis Coding ICD-10 Coding Code Description [phone] Non-pressure chronic ulcer of other part of right foot with fat layer exposed L97.522 Non-pressure chronic ulcer of other part of left foot with fat layer exposed Follow-up Appointments ppointment in 1 week. - Dr. Lady Gary Room 3 - Return A Anesthetic Wound #2 Left Calcaneus (In clinic) Topical Lidocaine 4% applied [name] wound bed - In clinic Bathing/ Shower/ Hygiene May shower and wash wound with soap and water. Edema Control - Lymphedema / SCD / Other Bilateral Lower Extremities Avoid standing for long periods of time. Moisturize legs daily. - as needed Off-Loading Other: -  hypertension 07/15/2020 07/15/2020 M86.171 Other acute osteomyelitis, right ankle and foot 09/03/2020 09/03/2020 Resolved Problems [name], [name] ([id number]) [phone].pdf Page 10 of 19 Electronic Signature(s) Signed: 05/18/2023 12:43:17 PM By: [name] MD FACS Entered By: [name] on 05/18/2023  12:43:17 -------------------------------------------------------------------------------- Progress Note Details Patient Name: Date of Service: [name], [name] [name] 05/18/2023 11:00 A M Medical Record Number: [id number] Patient Account Number: [id number] Date of Birth/Sex: Treating RN: 06-Sep-1975 (47 y.o. M) Primary Care Provider: Dorinda [name] Other Clinician: Referring Provider: Treating Provider/Extender: [name] in Treatment: 148 Subjective Chief Complaint Information obtained from Patient Bilateral Plantar Foot Ulcers History of Present Illness (HPI) Wounds are12/03/2020 upon evaluation today patient presents for initial inspection here in our clinic concerning issues he has been having with the bottoms of his feet bilaterally. He states these actually occurred as wounds when he was hospitalized for 5 months secondary [name] Covid. He was apparently with tilting bed where he was in an upright position quite frequently and apparently this occurred in some way shape or form during that time. Fortunately there is no sign of active infection at this time. No fevers, chills, nausea, vomiting, or diarrhea. With that being said he still has substantial wounds on the plantar aspects of his feet Theragen require quite a bit of work [name] get these [name] heal. He has been using Santyl currently though that is been problematic both in receiving the medication as well as actually paid for it as it is become quite expensive. Prior [name] the experience with Covid the patient really did not have any major medical problems other than hypertension he does have some mild generalized weakness following the Covid experience. 07/22/2020 on evaluation today patient appears [name] be doing okay in regard [name] his foot ulcers I feel like the wound beds are showing signs of better improvement that I do believe the Iodoflex is helping in this regard. With that being said he does have a lot of drainage currently  and this is somewhat blue/green in nature which is consistent with Pseudomonas. I do think a culture today would be appropriate for [place] [name] evaluate and see if that is indeed the case I would likely start him on antibiotic orally as well he is not allergic [name] Cipro knows of no issues he has had in the past 12/21; patient was admitted [name] the clinic earlier this month with bilateral presumed pressure ulcers on the bottom of his feet apparently related [name] excessive pressure from a tilt table arrangement in the intensive care unit. Patient relates this [name] being on ECMO but I am not really sure that is exactly related [name] that. I must say I have never seen anything like this. He has fairly extensive full-thickness wounds extending from his heel towards his midfoot mostly centered laterally. There is already been some healing distally. He does not appear [name] have an arterial issue. He has been using gentamicin [name] the wound surfaces with Iodoflex [name] help with ongoing debridement 1/6; this is a patient with pressure ulcers on the bottom of his feet related [name] excessive pressure from a standing position in the intensive care unit. He is complaining of a lot of pain in the right heel. He is not a diabetic. 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 We have been using Iodoflex [name] help prepare the surfaces of both wounds for an advanced treatment product. He is nonambulatory spending most of his time in a wheelchair I have asked  of slough on the surface and minimal eschar around the edges. 03/15/2023: The wound measured narrower today. There is some slough accumulation on the surface and some eschar around the edges. 03/23/2023: The length of the wound remains stable, but it continues [name] narrow. The skin that has covered the posterior aspect of the calcaneus is more robust and has better tensile integrity. Minimal slough and eschar accumulation. 03/30/2023: The wound length has decreased. This seems [name] be primarily due [name] more epithelium covering the posterior aspect of the site. Minimal slough and eschar buildup. 04/07/2023: The wound measured a little bit smaller again today. There is minimal slough accumulation with just a little bit of eschar and callus around the edges. 04/13/2023: The lateral aspect of his wound is starting [name] epithelialize. There is very minimal slough on the surface. Moisture control is good. 04/20/2023: Continued, albeit extremely slow, encroachment of  epithelium as of the wound surface. Minimal slough. Moisture balance is good. 04/27/2023: More epithelium is filling in from the heel area. There is a little bit of slough on the surface with some periwound callus and eschar. He did note a little bit of drainage coming from his right foot and there is a small crack in the skin extending medially from the main site of his prior ulceration. 05/04/2023: The wounds are stable this week. No significant change. There is a little bit of callus buildup around the edges of each site and some thin slough on the surface on the left foot. 05/11/2023: The skin on the right heel is filling in further. There is a little bit of slough accumulation on both surfaces along with periwound callus accumulation. 05/18/2023: Both wounds are smaller today. The right foot is down [name] just a sliver of opening. Electronic Signature(s) Signed: 05/18/2023 12:46:11 PM By: [name] MD FACS Entered By: [name] on 05/18/2023 12:46:11 -------------------------------------------------------------------------------- Physical Exam Details Patient Name: Date of Service: [name], [name] [place] E. 05/18/2023 11:00 A M Medical Record Number: [id number] Patient Account Number: [id number] Date of Birth/Sex: Treating RN: 1976/03/13 (47 y.o. M) Primary Care Provider: Dorinda [name] Other Clinician: Referring Provider: Treating Provider/Extender: [name] in Treatment: 148 Constitutional . . . . no acute distress. Respiratory Normal work of breathing on room air. Notes 05/18/2023: Both wounds are smaller today. The right foot is down [name] just a sliver of opening. Electronic Signature(s) Signed: 05/18/2023 12:47:21 PM By: [name] MD FACS Entered By: [name] on 05/18/2023 12:47:20 [name] ([id number]) [id number] Page 8 of  19 -------------------------------------------------------------------------------- Physician Orders Details Patient Name: Date of Service: [name] [name] 05/18/2023 11:00 A M Medical Record Number: [id number] Patient Account Number: [id number] Date of Birth/Sex: Treating RN: Aug 02, 1976 (47 y.o. [name] Primary Care Provider: Dorinda [name] Other Clinician: Referring Provider: Treating Provider/Extender: [name] in Treatment: [phone] Verbal / Phone Orders: No Diagnosis Coding ICD-10 Coding Code Description [phone] Non-pressure chronic ulcer of other part of right foot with fat layer exposed L97.522 Non-pressure chronic ulcer of other part of left foot with fat layer exposed Follow-up Appointments ppointment in 1 week. - Dr. Lady Gary Room 3 - Return A Anesthetic Wound #2 Left Calcaneus (In clinic) Topical Lidocaine 4% applied [name] wound bed - In clinic Bathing/ Shower/ Hygiene May shower and wash wound with soap and water. Edema Control - Lymphedema / SCD / Other Bilateral Lower Extremities Avoid standing for long periods of time. Moisturize legs daily. - as needed Off-Loading Other: -  of slough on the surface and minimal eschar around the edges. 03/15/2023: The wound measured narrower today. There is some slough accumulation on the surface and some eschar around the edges. 03/23/2023: The length of the wound remains stable, but it continues [name] narrow. The skin that has covered the posterior aspect of the calcaneus is more robust and has better tensile integrity. Minimal slough and eschar accumulation. 03/30/2023: The wound length has decreased. This seems [name] be primarily due [name] more epithelium covering the posterior aspect of the site. Minimal slough and eschar buildup. 04/07/2023: The wound measured a little bit smaller again today. There is minimal slough accumulation with just a little bit of eschar and callus around the edges. 04/13/2023: The lateral aspect of his wound is starting [name] epithelialize. There is very minimal slough on the surface. Moisture control is good. 04/20/2023: Continued, albeit extremely slow, encroachment of  epithelium as of the wound surface. Minimal slough. Moisture balance is good. 04/27/2023: More epithelium is filling in from the heel area. There is a little bit of slough on the surface with some periwound callus and eschar. He did note a little bit of drainage coming from his right foot and there is a small crack in the skin extending medially from the main site of his prior ulceration. 05/04/2023: The wounds are stable this week. No significant change. There is a little bit of callus buildup around the edges of each site and some thin slough on the surface on the left foot. 05/11/2023: The skin on the right heel is filling in further. There is a little bit of slough accumulation on both surfaces along with periwound callus accumulation. 05/18/2023: Both wounds are smaller today. The right foot is down [name] just a sliver of opening. Electronic Signature(s) Signed: 05/18/2023 12:46:11 PM By: [name] MD FACS Entered By: [name] on 05/18/2023 12:46:11 -------------------------------------------------------------------------------- Physical Exam Details Patient Name: Date of Service: [name], [name] [place] E. 05/18/2023 11:00 A M Medical Record Number: [id number] Patient Account Number: [id number] Date of Birth/Sex: Treating RN: 1976/03/13 (47 y.o. M) Primary Care Provider: Dorinda [name] Other Clinician: Referring Provider: Treating Provider/Extender: [name] in Treatment: 148 Constitutional . . . . no acute distress. Respiratory Normal work of breathing on room air. Notes 05/18/2023: Both wounds are smaller today. The right foot is down [name] just a sliver of opening. Electronic Signature(s) Signed: 05/18/2023 12:47:21 PM By: [name] MD FACS Entered By: [name] on 05/18/2023 12:47:20 [name] ([id number]) [id number] Page 8 of  19 -------------------------------------------------------------------------------- Physician Orders Details Patient Name: Date of Service: [name] [name] 05/18/2023 11:00 A M Medical Record Number: [id number] Patient Account Number: [id number] Date of Birth/Sex: Treating RN: Aug 02, 1976 (47 y.o. [name] Primary Care Provider: Dorinda [name] Other Clinician: Referring Provider: Treating Provider/Extender: [name] in Treatment: [phone] Verbal / Phone Orders: No Diagnosis Coding ICD-10 Coding Code Description [phone] Non-pressure chronic ulcer of other part of right foot with fat layer exposed L97.522 Non-pressure chronic ulcer of other part of left foot with fat layer exposed Follow-up Appointments ppointment in 1 week. - Dr. Lady Gary Room 3 - Return A Anesthetic Wound #2 Left Calcaneus (In clinic) Topical Lidocaine 4% applied [name] wound bed - In clinic Bathing/ Shower/ Hygiene May shower and wash wound with soap and water. Edema Control - Lymphedema / SCD / Other Bilateral Lower Extremities Avoid standing for long periods of time. Moisturize legs daily. - as needed Off-Loading Other: -  [name], [name] ([id number]) [id number] Page 1 of 19 Visit Report for 05/18/2023 Chief Complaint Document Details Patient Name: Date of Service: [name], [name] [name] 05/18/2023 11:00 A M Medical Record Number: [id number] Patient Account Number: [id number] Date of Birth/Sex: Treating RN: 10-Jan-1976 (47 y.o. M) Primary Care Provider: Dorinda [name] Other Clinician: Referring Provider: Treating Provider/Extender: [name] in Treatment: 148 Information Obtained from: Patient Chief Complaint Bilateral Plantar Foot Ulcers Electronic Signature(s) Signed: 05/18/2023 12:44:33 PM By: [name] MD FACS Entered By: [name] on 05/18/2023 12:44:32 -------------------------------------------------------------------------------- Debridement Details Patient Name: Date of Service: [name], [name] [place] E. 05/18/2023 11:00 A M Medical Record Number: [id number] Patient Account Number: [id number] Date of Birth/Sex: Treating RN: [birth year] (47 y.o. [name] Primary Care Provider: Dorinda [name] Other Clinician: Referring Provider: Treating Provider/Extender: [name] in Treatment: 148 Debridement Performed for Assessment: Wound #5 Right,Plantar Foot Performed By: Physician [name], MD The following information was scribed by: Brenton Grills The information was scribed for: [name] Debridement Type: Debridement Level of Consciousness (Pre-procedure): Awake and Alert Pre-procedure Verification/Time Out Yes - 12:00 Taken: Start Time: 12:01 Pain Control: Lidocaine 4% T opical Solution Percent of Wound Bed Debrided: 100% T Area Debrided (cm): otal 0.08 Tissue and other material debrided: Viable, Callus, Slough, Slough Level: Non-Viable Tissue Debridement Description: Selective/Open Wound Instrument: Curette Bleeding: Minimum Hemostasis Achieved: Pressure Response [name] Treatment: Procedure was  tolerated well Level of Consciousness (Post- Awake and Alert procedure): Post Debridement Measurements of Total Wound Length: (cm) 1 Stage: Category/Stage II Width: (cm) 0.1 Depth: (cm) 0.2 Volume: (cm) 0.016 Character of Wound/Ulcer Post Debridement: Improved Post Procedure Diagnosis [name], [name] ([id number]) [id number] Page 2 of 19 Same as Pre-procedure Electronic Signature(s) Signed: 05/18/2023 1:10:39 PM By: [name] MD FACS Signed: 05/18/2023 4:20:59 PM By: Brenton Grills Entered By: Brenton Grills on 05/18/2023 12:02:29 -------------------------------------------------------------------------------- Debridement Details Patient Name: Date of Service: [name], [name] [place] E. 05/18/2023 11:00 A M Medical Record Number: [id number] Patient Account Number: [id number] Date of Birth/Sex: Treating RN: 22-Aug-1975 (47 y.o. [name] Primary Care Provider: Dorinda [name] Other Clinician: Referring Provider: Treating Provider/Extender: [name] in Treatment: 148 Debridement Performed for Assessment: Wound #2 Left Calcaneus Performed By: Physician [name], MD The following information was scribed by: Brenton Grills The information was scribed for: [name] Debridement Type: Debridement Level of Consciousness (Pre-procedure): Awake and Alert Pre-procedure Verification/Time Out Yes - 12:00 Taken: Start Time: 12:01 Pain Control: Lidocaine 4% T opical Solution Percent of Wound Bed Debrided: 100% T Area Debrided (cm): otal 2.2 Tissue and other material debrided: Viable, Callus, Slough, Slough Level: Non-Viable Tissue Debridement Description: Selective/Open Wound Instrument: Curette Bleeding: Minimum Hemostasis Achieved: Pressure Response [name] Treatment: Procedure was tolerated well Level of Consciousness (Post- Awake and Alert procedure): Post Debridement Measurements of Total Wound Length: (cm)  2.8 Stage: Category/Stage III Width: (cm) 1 Depth: (cm) 0.1 Volume: (cm) 0.22 Character of Wound/Ulcer Post Debridement: Improved Post Procedure Diagnosis Same as Pre-procedure Electronic Signature(s) Signed: 05/18/2023 1:10:39 PM By: [name] MD FACS Signed: 05/18/2023 4:20:59 PM By: Brenton Grills Entered By: Brenton Grills on 05/18/2023 12:03:13 -------------------------------------------------------------------------------- HPI Details Patient Name: Date of Service: [name], [name] [place] E. 05/18/2023 11:00 A M Medical Record Number: [id number] Patient Account Number: [id number] Date of Birth/Sex: Treating RN: 07-Jan-1976 (47 y.o. M) Primary Care Provider: Dorinda [name] Other Clinician: [name] ([id number]) [id number] Page 3 of 19 Referring Provider: Treating Provider/Extender: [name]  wound bed as instructed Secondary Dressing: Drawtex 4x4 in 1 x Per Day/30 Days Discharge Instructions: Apply over primary dressing as directed. Secondary Dressing: Optifoam Non-Adhesive Dressing, 4x4 in 1 x Per Day/30 Days Discharge Instructions: Apply over primary dressing as directed. Secured With: [employer], 4.5x3.1 (in/yd) 1 x Per Day/30 Days Discharge Instructions: Secure with Kerlix as directed. 05/18/2023: Both wounds are smaller today. The right foot is down [name] just a sliver of opening. I used a curette [name] debride callus from the right foot and callus and slough from the left foot. We will continue topical gentamicin with silver alginate, drawtex, and foam donut offloading bilaterally. Follow-up in 1 week. [name], [name] ([id number]) [id number] Page 17 of 19 Electronic Signature(s) Signed: 05/18/2023 12:48:49 PM By: [name] MD FACS Entered By: [name] on 05/18/2023 12:48:49 -------------------------------------------------------------------------------- HxROS Details Patient Name: Date of Service: [name], [name] [place] E. 05/18/2023 11:00 A M Medical Record Number: [id number] Patient Account Number: [id number] Date of Birth/Sex: Treating RN: 1975/08/26 (47 y.o. M) Primary Care Provider: Dorinda [name] Other Clinician: Referring Provider: Treating Provider/Extender: [name] in Treatment:  148 Information Obtained From Patient Constitutional Symptoms (General Health) Medical History: Past Medical History Notes: COVID PNA 07/22/2019-11/14/2019 [facility], foot drop left foot , Eyes Medical History: Negative for: Cataracts; Glaucoma; Optic Neuritis Ear/Nose/Mouth/Throat Medical History: Negative for: Chronic sinus problems/congestion; Middle ear problems Hematologic/Lymphatic Medical History: Negative for: Anemia; Hemophilia; Human Immunodeficiency Virus; Lymphedema; Sickle Cell Disease Respiratory Medical History: Positive for: Asthma Negative for: Aspiration; Chronic Obstructive Pulmonary Disease (COPD); Pneumothorax; Sleep Apnea; Tuberculosis Cardiovascular Medical History: Positive for: Angina - with COVID; Hypertension Negative for: Arrhythmia; Congestive Heart Failure; Coronary Artery Disease; Deep Vein Thrombosis; Hypotension; Myocardial Infarction; Peripheral Arterial Disease; Peripheral Venous Disease; Phlebitis; Vasculitis Gastrointestinal Medical History: Negative for: Cirrhosis ; Colitis; Crohns; Hepatitis A; Hepatitis B; Hepatitis C Endocrine Medical History: Negative for: Type I Diabetes; Type II Diabetes Genitourinary Medical History: Negative for: End Stage Renal Disease Past Medical History Notes: kidney stone Immunological Medical HistoryDMITRY, [name] ([id number]) [id number] Page 18 of 19 Negative for: Lupus Erythematosus; Raynauds; Scleroderma Integumentary (Skin) Medical History: Negative for: History of Burn Musculoskeletal Medical History: Negative for: Gout; Rheumatoid Arthritis; Osteoarthritis; Osteomyelitis Neurologic Medical History: Negative for: Dementia; Neuropathy; Quadriplegia; Paraplegia; Seizure Disorder Oncologic Medical History: Negative for: Received Chemotherapy; Received Radiation Psychiatric Medical History: Negative for: Anorexia/bulimia; Confinement Anxiety Past Medical History  Notes: anxiety Immunizations Pneumococcal Vaccine: Received Pneumococcal Vaccination: No Implantable Devices None Hospitalization / Surgery History Type of Hospitalization/Surgery COVID PNA 07/22/2019- 11/14/2019 03/27/2020 wound debridement/ skin graft Family and Social History Cancer: Yes - Maternal Grandparents; Diabetes: Yes - Father,Paternal Grandparents; Heart Disease: Yes - Maternal Grandparents; Hereditary Spherocytosis: No; Hypertension: Yes - Father,Paternal Grandparents; Kidney Disease: No; Lung Disease: Yes - Siblings; Seizures: No; Stroke: No; Thyroid Problems: No; Tuberculosis: No; Never smoker; Marital Status - Married; Alcohol Use: Never; Drug Use: No History; Caffeine Use: Daily - tea, soda; Financial Concerns: No; Food, Clothing or Shelter Needs: No; Support System Lacking: No; Transportation Concerns: No Psychologist, prison and probation services) Signed: 05/18/2023 1:10:39 PM By: [name] MD FACS Entered By: [name] on 05/18/2023 12:46:58 -------------------------------------------------------------------------------- SuperBill Details Patient Name: Date of Service: [name], [name] [name] 05/18/2023 Medical Record Number: [id number] Patient Account Number: [id number] Date of Birth/Sex: Treating RN: [birth year] (47 y.o. M) Primary Care Provider: Dorinda [name] Other Clinician: Referring Provider: Treating Provider/Extender: [name] in Treatment: 148 Diagnosis Coding ICD-10 Codes Code Description [phone] Non-pressure chronic  [name], [name] ([id number]) [id number] Page 1 of 19 Visit Report for 05/18/2023 Chief Complaint Document Details Patient Name: Date of Service: [name], [name] [name] 05/18/2023 11:00 A M Medical Record Number: [id number] Patient Account Number: [id number] Date of Birth/Sex: Treating RN: 10-Jan-1976 (47 y.o. M) Primary Care Provider: Dorinda [name] Other Clinician: Referring Provider: Treating Provider/Extender: [name] in Treatment: 148 Information Obtained from: Patient Chief Complaint Bilateral Plantar Foot Ulcers Electronic Signature(s) Signed: 05/18/2023 12:44:33 PM By: [name] MD FACS Entered By: [name] on 05/18/2023 12:44:32 -------------------------------------------------------------------------------- Debridement Details Patient Name: Date of Service: [name], [name] [place] E. 05/18/2023 11:00 A M Medical Record Number: [id number] Patient Account Number: [id number] Date of Birth/Sex: Treating RN: [birth year] (47 y.o. [name] Primary Care Provider: Dorinda [name] Other Clinician: Referring Provider: Treating Provider/Extender: [name] in Treatment: 148 Debridement Performed for Assessment: Wound #5 Right,Plantar Foot Performed By: Physician [name], MD The following information was scribed by: Brenton Grills The information was scribed for: [name] Debridement Type: Debridement Level of Consciousness (Pre-procedure): Awake and Alert Pre-procedure Verification/Time Out Yes - 12:00 Taken: Start Time: 12:01 Pain Control: Lidocaine 4% T opical Solution Percent of Wound Bed Debrided: 100% T Area Debrided (cm): otal 0.08 Tissue and other material debrided: Viable, Callus, Slough, Slough Level: Non-Viable Tissue Debridement Description: Selective/Open Wound Instrument: Curette Bleeding: Minimum Hemostasis Achieved: Pressure Response [name] Treatment: Procedure was  tolerated well Level of Consciousness (Post- Awake and Alert procedure): Post Debridement Measurements of Total Wound Length: (cm) 1 Stage: Category/Stage II Width: (cm) 0.1 Depth: (cm) 0.2 Volume: (cm) 0.016 Character of Wound/Ulcer Post Debridement: Improved Post Procedure Diagnosis [name], [name] ([id number]) [id number] Page 2 of 19 Same as Pre-procedure Electronic Signature(s) Signed: 05/18/2023 1:10:39 PM By: [name] MD FACS Signed: 05/18/2023 4:20:59 PM By: Brenton Grills Entered By: Brenton Grills on 05/18/2023 12:02:29 -------------------------------------------------------------------------------- Debridement Details Patient Name: Date of Service: [name], [name] [place] E. 05/18/2023 11:00 A M Medical Record Number: [id number] Patient Account Number: [id number] Date of Birth/Sex: Treating RN: 22-Aug-1975 (47 y.o. [name] Primary Care Provider: Dorinda [name] Other Clinician: Referring Provider: Treating Provider/Extender: [name] in Treatment: 148 Debridement Performed for Assessment: Wound #2 Left Calcaneus Performed By: Physician [name], MD The following information was scribed by: Brenton Grills The information was scribed for: [name] Debridement Type: Debridement Level of Consciousness (Pre-procedure): Awake and Alert Pre-procedure Verification/Time Out Yes - 12:00 Taken: Start Time: 12:01 Pain Control: Lidocaine 4% T opical Solution Percent of Wound Bed Debrided: 100% T Area Debrided (cm): otal 2.2 Tissue and other material debrided: Viable, Callus, Slough, Slough Level: Non-Viable Tissue Debridement Description: Selective/Open Wound Instrument: Curette Bleeding: Minimum Hemostasis Achieved: Pressure Response [name] Treatment: Procedure was tolerated well Level of Consciousness (Post- Awake and Alert procedure): Post Debridement Measurements of Total Wound Length: (cm)  2.8 Stage: Category/Stage III Width: (cm) 1 Depth: (cm) 0.1 Volume: (cm) 0.22 Character of Wound/Ulcer Post Debridement: Improved Post Procedure Diagnosis Same as Pre-procedure Electronic Signature(s) Signed: 05/18/2023 1:10:39 PM By: [name] MD FACS Signed: 05/18/2023 4:20:59 PM By: Brenton Grills Entered By: Brenton Grills on 05/18/2023 12:03:13 -------------------------------------------------------------------------------- HPI Details Patient Name: Date of Service: [name], [name] [place] E. 05/18/2023 11:00 A M Medical Record Number: [id number] Patient Account Number: [id number] Date of Birth/Sex: Treating RN: 07-Jan-1976 (47 y.o. M) Primary Care Provider: Dorinda [name] Other Clinician: [name] ([id number]) [id number] Page 3 of 19 Referring Provider: Treating Provider/Extender: [name]  keep pressure off of the bottom of your feet. Elevate legs throughout the day. Use the Shoe with the PegAssist off-loading insole Additional Orders / Instructions Follow Nutritious Diet - Try [name] get 70-100 grams of Protein a day+ Other: - 500 mg per day [name] start, then increase [name] 500mg  twice a day. Also consider adding a zinc supplement. Juven Shake 1-2 times daily. Wound Treatment Wound #2 - Calcaneus Wound Laterality: Left Cleanser: Normal Saline (Generic) 1 x Per Day/Other:until healed Discharge Instructions: Cleanse the wound with Normal Saline prior [name] applying a clean dressing using gauze sponges, not tissue or cotton balls. Cleanser: Wound Cleanser (Generic) 1 x Per Day/Other:until healed Discharge Instructions: Cleanse the wound with wound cleanser prior [name]  applying a clean dressing using gauze sponges, not tissue or cotton balls. Peri-Wound Care: Ketoconazole Cream 2% (Generic) 1 x Per Day/Other:until healed Discharge Instructions: Apply Ketoconazole mixed with zinc [name] the periwound Peri-Wound Care: Zinc Oxide Ointment 30g tube (Generic) 1 x Per Day/Other:until healed Discharge Instructions: Apply Zinc Oxide [name] periwound with each dressing change Topical: Gentamicin (Generic) 1 x Per Day/Other:until healed Discharge Instructions: apply [name] the wound bed Prim Dressing: Maxorb Extra Ag+ Alginate Dressing, 4x4.75 (in/in) (Generic) 1 x Per Day/Other:until healed ary Discharge Instructions: Apply [name] wound bed as instructed Secondary Dressing: Drawtex 4x4 in (Generic) 1 x Per Day/Other:until healed Discharge Instructions: Apply over primary dressing as directed. Secondary Dressing: Optifoam Non-Adhesive Dressing, 4x4 in (Generic) 1 x Per Day/Other:until healed Discharge Instructions: Apply over primary dressing as directed. Secondary Dressing: Woven Gauze Sponge, Non-Sterile 4x4 in (Generic) 1 x Per Day/Other:until healed Discharge Instructions: Apply over primary dressing as directed. Secured With: 14M Medipore H Soft Cloth Surgical T ape, 4 x 10 (in/yd) (Generic) 1 x Per Day/Other:until healed Discharge Instructions: Secure with tape as directed. Compression Wrap: Kerlix Roll 4.5x3.1 (in/yd) (Generic) 1 x Per Day/Other:until healed Discharge Instructions: Apply Kerlix and Coban compression as directed. [name], [name] ([id number]) [id number] Page 9 of 19 Add-Ons: [employer] Offloading Boot, Size Regular 1 x Per Day/Other:until healed Wound #5 - Foot Wound Laterality: Plantar, Right Topical: Gentamicin 1 x Per Day/30 Days Discharge Instructions: As directed by physician Prim Dressing: Maxorb Extra Ag+ Alginate Dressing, 4x4.75 (in/in) 1 x Per Day/30 Days ary Discharge Instructions: Apply [name] wound bed as  instructed Secondary Dressing: Drawtex 4x4 in 1 x Per Day/30 Days Discharge Instructions: Apply over primary dressing as directed. Secondary Dressing: Optifoam Non-Adhesive Dressing, 4x4 in 1 x Per Day/30 Days Discharge Instructions: Apply over primary dressing as directed. Secured With: [employer], 4.5x3.1 (in/yd) 1 x Per Day/30 Days Discharge Instructions: Secure with Kerlix as directed. Electronic Signature(s) Signed: 05/18/2023 1:10:39 PM By: [name] MD FACS Entered By: [name] on 05/18/2023 12:48:07 -------------------------------------------------------------------------------- Problem List Details Patient Name: Date of Service: [name], [name] [place] E. 05/18/2023 11:00 A M Medical Record Number: [id number] Patient Account Number: [id number] Date of Birth/Sex: Treating RN: 09/08/1975 (47 y.o. [name] Primary Care Provider: Dorinda [name] Other Clinician: Referring Provider: Treating Provider/Extender: [name] in Treatment: [id number] Active Problems ICD-10 Encounter Code Description Active Date MDM Diagnosis L97.512 Non-pressure chronic ulcer of other part of right foot with fat layer exposed 04/27/2023 No Yes L97.522 Non-pressure chronic ulcer of other part of left foot with fat layer exposed 09/03/2020 No Yes Inactive Problems ICD-10 Code Description Active Date Inactive Date L89.893 Pressure ulcer of other site, stage 3 07/15/2020 07/15/2020 M62.81 Muscle weakness (generalized) 07/15/2020 07/15/2020 I10 Essential (primary)  hypertension 07/15/2020 07/15/2020 M86.171 Other acute osteomyelitis, right ankle and foot 09/03/2020 09/03/2020 Resolved Problems [name], [name] ([id number]) [phone].pdf Page 10 of 19 Electronic Signature(s) Signed: 05/18/2023 12:43:17 PM By: [name] MD FACS Entered By: [name] on 05/18/2023  12:43:17 -------------------------------------------------------------------------------- Progress Note Details Patient Name: Date of Service: [name], [name] [name] 05/18/2023 11:00 A M Medical Record Number: [id number] Patient Account Number: [id number] Date of Birth/Sex: Treating RN: 06-Sep-1975 (47 y.o. M) Primary Care Provider: Dorinda [name] Other Clinician: Referring Provider: Treating Provider/Extender: [name] in Treatment: 148 Subjective Chief Complaint Information obtained from Patient Bilateral Plantar Foot Ulcers History of Present Illness (HPI) Wounds are12/03/2020 upon evaluation today patient presents for initial inspection here in our clinic concerning issues he has been having with the bottoms of his feet bilaterally. He states these actually occurred as wounds when he was hospitalized for 5 months secondary [name] Covid. He was apparently with tilting bed where he was in an upright position quite frequently and apparently this occurred in some way shape or form during that time. Fortunately there is no sign of active infection at this time. No fevers, chills, nausea, vomiting, or diarrhea. With that being said he still has substantial wounds on the plantar aspects of his feet Theragen require quite a bit of work [name] get these [name] heal. He has been using Santyl currently though that is been problematic both in receiving the medication as well as actually paid for it as it is become quite expensive. Prior [name] the experience with Covid the patient really did not have any major medical problems other than hypertension he does have some mild generalized weakness following the Covid experience. 07/22/2020 on evaluation today patient appears [name] be doing okay in regard [name] his foot ulcers I feel like the wound beds are showing signs of better improvement that I do believe the Iodoflex is helping in this regard. With that being said he does have a lot of drainage currently  and this is somewhat blue/green in nature which is consistent with Pseudomonas. I do think a culture today would be appropriate for [place] [name] evaluate and see if that is indeed the case I would likely start him on antibiotic orally as well he is not allergic [name] Cipro knows of no issues he has had in the past 12/21; patient was admitted [name] the clinic earlier this month with bilateral presumed pressure ulcers on the bottom of his feet apparently related [name] excessive pressure from a tilt table arrangement in the intensive care unit. Patient relates this [name] being on ECMO but I am not really sure that is exactly related [name] that. I must say I have never seen anything like this. He has fairly extensive full-thickness wounds extending from his heel towards his midfoot mostly centered laterally. There is already been some healing distally. He does not appear [name] have an arterial issue. He has been using gentamicin [name] the wound surfaces with Iodoflex [name] help with ongoing debridement 1/6; this is a patient with pressure ulcers on the bottom of his feet related [name] excessive pressure from a standing position in the intensive care unit. He is complaining of a lot of pain in the right heel. He is not a diabetic. 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 We have been using Iodoflex [name] help prepare the surfaces of both wounds for an advanced treatment product. He is nonambulatory spending most of his time in a wheelchair I have asked  keep pressure off of the bottom of your feet. Elevate legs throughout the day. Use the Shoe with the PegAssist off-loading insole Additional Orders / Instructions Follow Nutritious Diet - Try [name] get 70-100 grams of Protein a day+ Other: - 500 mg per day [name] start, then increase [name] 500mg  twice a day. Also consider adding a zinc supplement. Juven Shake 1-2 times daily. Wound Treatment Wound #2 - Calcaneus Wound Laterality: Left Cleanser: Normal Saline (Generic) 1 x Per Day/Other:until healed Discharge Instructions: Cleanse the wound with Normal Saline prior [name] applying a clean dressing using gauze sponges, not tissue or cotton balls. Cleanser: Wound Cleanser (Generic) 1 x Per Day/Other:until healed Discharge Instructions: Cleanse the wound with wound cleanser prior [name]  applying a clean dressing using gauze sponges, not tissue or cotton balls. Peri-Wound Care: Ketoconazole Cream 2% (Generic) 1 x Per Day/Other:until healed Discharge Instructions: Apply Ketoconazole mixed with zinc [name] the periwound Peri-Wound Care: Zinc Oxide Ointment 30g tube (Generic) 1 x Per Day/Other:until healed Discharge Instructions: Apply Zinc Oxide [name] periwound with each dressing change Topical: Gentamicin (Generic) 1 x Per Day/Other:until healed Discharge Instructions: apply [name] the wound bed Prim Dressing: Maxorb Extra Ag+ Alginate Dressing, 4x4.75 (in/in) (Generic) 1 x Per Day/Other:until healed ary Discharge Instructions: Apply [name] wound bed as instructed Secondary Dressing: Drawtex 4x4 in (Generic) 1 x Per Day/Other:until healed Discharge Instructions: Apply over primary dressing as directed. Secondary Dressing: Optifoam Non-Adhesive Dressing, 4x4 in (Generic) 1 x Per Day/Other:until healed Discharge Instructions: Apply over primary dressing as directed. Secondary Dressing: Woven Gauze Sponge, Non-Sterile 4x4 in (Generic) 1 x Per Day/Other:until healed Discharge Instructions: Apply over primary dressing as directed. Secured With: 14M Medipore H Soft Cloth Surgical T ape, 4 x 10 (in/yd) (Generic) 1 x Per Day/Other:until healed Discharge Instructions: Secure with tape as directed. Compression Wrap: Kerlix Roll 4.5x3.1 (in/yd) (Generic) 1 x Per Day/Other:until healed Discharge Instructions: Apply Kerlix and Coban compression as directed. [name], [name] ([id number]) [id number] Page 9 of 19 Add-Ons: Ro[employer] V[employer] Offloading Boot, Size Regular 1 x Per Day/Other:until healed Wound #5 - Foot Wound Laterality: Plantar, Right Topical: Gentamicin 1 x Per Day/30 Days Discharge Instructions: As directed by physician Prim Dressing: Maxorb Extra Ag+ Alginate Dressing, 4x4.75 (in/in) 1 x Per Day/30 Days ary Discharge Instructions: Apply [name] wound bed as  instructed Secondary Dressing: Drawtex 4x4 in 1 x Per Day/30 Days Discharge Instructions: Apply over primary dressing as directed. Secondary Dressing: Optifoam Non-Adhesive Dressing, 4x4 in 1 x Per Day/30 Days Discharge Instructions: Apply over primary dressing as directed. Secured With: [employer], 4.5x3.1 (in/yd) 1 x Per Day/30 Days Discharge Instructions: Secure with Kerlix as directed. Electronic Signature(s) Signed: 05/18/2023 1:10:39 PM By: [name] MD FACS Entered By: [name] on 05/18/2023 12:48:07 -------------------------------------------------------------------------------- Problem List Details Patient Name: Date of Service: [name], [name] [place] E. 05/18/2023 11:00 A M Medical Record Number: [id number] Patient Account Number: [id number] Date of Birth/Sex: Treating RN: 09/08/1975 (47 y.o. [name] Primary Care Provider: Dorinda [name] Other Clinician: Referring Provider: Treating Provider/Extender: [name] in Treatment: [id number] Active Problems ICD-10 Encounter Code Description Active Date MDM Diagnosis L97.512 Non-pressure chronic ulcer of other part of right foot with fat layer exposed 04/27/2023 No Yes L97.522 Non-pressure chronic ulcer of other part of left foot with fat layer exposed 09/03/2020 No Yes Inactive Problems ICD-10 Code Description Active Date Inactive Date L89.893 Pressure ulcer of other site, stage 3 07/15/2020 07/15/2020 M62.81 Muscle weakness (generalized) 07/15/2020 07/15/2020 I10 Essential (primary)  [name], [name] ([id number]) [id number] Page 1 of 19 Visit Report for 05/18/2023 Chief Complaint Document Details Patient Name: Date of Service: [name], [name] [name] 05/18/2023 11:00 A M Medical Record Number: [id number] Patient Account Number: [id number] Date of Birth/Sex: Treating RN: 10-Jan-1976 (47 y.o. M) Primary Care Provider: Dorinda [name] Other Clinician: Referring Provider: Treating Provider/Extender: [name] in Treatment: 148 Information Obtained from: Patient Chief Complaint Bilateral Plantar Foot Ulcers Electronic Signature(s) Signed: 05/18/2023 12:44:33 PM By: [name] MD FACS Entered By: [name] on 05/18/2023 12:44:32 -------------------------------------------------------------------------------- Debridement Details Patient Name: Date of Service: [name], [name] [place] E. 05/18/2023 11:00 A M Medical Record Number: [id number] Patient Account Number: [id number] Date of Birth/Sex: Treating RN: [birth year] (47 y.o. [name] Primary Care Provider: Dorinda [name] Other Clinician: Referring Provider: Treating Provider/Extender: [name] in Treatment: 148 Debridement Performed for Assessment: Wound #5 Right,Plantar Foot Performed By: Physician [name], MD The following information was scribed by: Brenton Grills The information was scribed for: [name] Debridement Type: Debridement Level of Consciousness (Pre-procedure): Awake and Alert Pre-procedure Verification/Time Out Yes - 12:00 Taken: Start Time: 12:01 Pain Control: Lidocaine 4% T opical Solution Percent of Wound Bed Debrided: 100% T Area Debrided (cm): otal 0.08 Tissue and other material debrided: Viable, Callus, Slough, Slough Level: Non-Viable Tissue Debridement Description: Selective/Open Wound Instrument: Curette Bleeding: Minimum Hemostasis Achieved: Pressure Response [name] Treatment: Procedure was  tolerated well Level of Consciousness (Post- Awake and Alert procedure): Post Debridement Measurements of Total Wound Length: (cm) 1 Stage: Category/Stage II Width: (cm) 0.1 Depth: (cm) 0.2 Volume: (cm) 0.016 Character of Wound/Ulcer Post Debridement: Improved Post Procedure Diagnosis [name], [name] ([id number]) [id number] Page 2 of 19 Same as Pre-procedure Electronic Signature(s) Signed: 05/18/2023 1:10:39 PM By: [name] MD FACS Signed: 05/18/2023 4:20:59 PM By: Brenton Grills Entered By: Brenton Grills on 05/18/2023 12:02:29 -------------------------------------------------------------------------------- Debridement Details Patient Name: Date of Service: [name], [name] [place] E. 05/18/2023 11:00 A M Medical Record Number: [id number] Patient Account Number: [id number] Date of Birth/Sex: Treating RN: 22-Aug-1975 (47 y.o. [name] Primary Care Provider: Dorinda [name] Other Clinician: Referring Provider: Treating Provider/Extender: [name] in Treatment: 148 Debridement Performed for Assessment: Wound #2 Left Calcaneus Performed By: Physician [name], MD The following information was scribed by: Brenton Grills The information was scribed for: [name] Debridement Type: Debridement Level of Consciousness (Pre-procedure): Awake and Alert Pre-procedure Verification/Time Out Yes - 12:00 Taken: Start Time: 12:01 Pain Control: Lidocaine 4% T opical Solution Percent of Wound Bed Debrided: 100% T Area Debrided (cm): otal 2.2 Tissue and other material debrided: Viable, Callus, Slough, Slough Level: Non-Viable Tissue Debridement Description: Selective/Open Wound Instrument: Curette Bleeding: Minimum Hemostasis Achieved: Pressure Response [name] Treatment: Procedure was tolerated well Level of Consciousness (Post- Awake and Alert procedure): Post Debridement Measurements of Total Wound Length: (cm)  2.8 Stage: Category/Stage III Width: (cm) 1 Depth: (cm) 0.1 Volume: (cm) 0.22 Character of Wound/Ulcer Post Debridement: Improved Post Procedure Diagnosis Same as Pre-procedure Electronic Signature(s) Signed: 05/18/2023 1:10:39 PM By: [name] MD FACS Signed: 05/18/2023 4:20:59 PM By: Brenton Grills Entered By: Brenton Grills on 05/18/2023 12:03:13 -------------------------------------------------------------------------------- HPI Details Patient Name: Date of Service: [name], [name] [place] E. 05/18/2023 11:00 A M Medical Record Number: [id number] Patient Account Number: [id number] Date of Birth/Sex: Treating RN: 07-Jan-1976 (47 y.o. M) Primary Care Provider: Dorinda [name] Other Clinician: [name] ([id number]) [id number] Page 3 of 19 Referring Provider: Treating Provider/Extender: [name]  wound bed as instructed Secondary Dressing: Drawtex 4x4 in 1 x Per Day/30 Days Discharge Instructions: Apply over primary dressing as directed. Secondary Dressing: Optifoam Non-Adhesive Dressing, 4x4 in 1 x Per Day/30 Days Discharge Instructions: Apply over primary dressing as directed. Secured With: [employer], 4.5x3.1 (in/yd) 1 x Per Day/30 Days Discharge Instructions: Secure with Kerlix as directed. 05/18/2023: Both wounds are smaller today. The right foot is down [name] just a sliver of opening. I used a curette [name] debride callus from the right foot and callus and slough from the left foot. We will continue topical gentamicin with silver alginate, drawtex, and foam donut offloading bilaterally. Follow-up in 1 week. [name], [name] ([id number]) [id number] Page 17 of 19 Electronic Signature(s) Signed: 05/18/2023 12:48:49 PM By: [name] MD FACS Entered By: [name] on 05/18/2023 12:48:49 -------------------------------------------------------------------------------- HxROS Details Patient Name: Date of Service: [name], [name] [place] E. 05/18/2023 11:00 A M Medical Record Number: [id number] Patient Account Number: [id number] Date of Birth/Sex: Treating RN: 1975/08/26 (47 y.o. M) Primary Care Provider: Dorinda [name] Other Clinician: Referring Provider: Treating Provider/Extender: [name] in Treatment:  148 Information Obtained From Patient Constitutional Symptoms (General Health) Medical History: Past Medical History Notes: COVID PNA 07/22/2019-11/14/2019 VENT ECMO, foot drop left foot , Eyes Medical History: Negative for: Cataracts; Glaucoma; Optic Neuritis Ear/Nose/Mouth/Throat Medical History: Negative for: Chronic sinus problems/congestion; Middle ear problems Hematologic/Lymphatic Medical History: Negative for: Anemia; Hemophilia; Human Immunodeficiency Virus; Lymphedema; Sickle Cell Disease Respiratory Medical History: Positive for: Asthma Negative for: Aspiration; Chronic Obstructive Pulmonary Disease (COPD); Pneumothorax; Sleep Apnea; Tuberculosis Cardiovascular Medical History: Positive for: Angina - with COVID; Hypertension Negative for: Arrhythmia; Congestive Heart Failure; Coronary Artery Disease; Deep Vein Thrombosis; Hypotension; Myocardial Infarction; Peripheral Arterial Disease; Peripheral Venous Disease; Phlebitis; Vasculitis Gastrointestinal Medical History: Negative for: Cirrhosis ; Colitis; Crohns; Hepatitis A; Hepatitis B; Hepatitis C Endocrine Medical History: Negative for: Type I Diabetes; Type II Diabetes Genitourinary Medical History: Negative for: End Stage Renal Disease Past Medical History Notes: kidney stone Immunological Medical HistoryDMITRY, [name] ([id number]) [id number] Page 18 of 19 Negative for: Lupus Erythematosus; Raynauds; Scleroderma Integumentary (Skin) Medical History: Negative for: History of Burn Musculoskeletal Medical History: Negative for: Gout; Rheumatoid Arthritis; Osteoarthritis; Osteomyelitis Neurologic Medical History: Negative for: Dementia; Neuropathy; Quadriplegia; Paraplegia; Seizure Disorder Oncologic Medical History: Negative for: Received Chemotherapy; Received Radiation Psychiatric Medical History: Negative for: Anorexia/bulimia; Confinement Anxiety Past Medical History  Notes: anxiety Immunizations Pneumococcal Vaccine: Received Pneumococcal Vaccination: No Implantable Devices None Hospitalization / Surgery History Type of Hospitalization/Surgery COVID PNA 07/22/2019- 11/14/2019 03/27/2020 wound debridement/ skin graft Family and Social History Cancer: Yes - Maternal Grandparents; Diabetes: Yes - Father,Paternal Grandparents; Heart Disease: Yes - Maternal Grandparents; Hereditary Spherocytosis: No; Hypertension: Yes - Father,Paternal Grandparents; Kidney Disease: No; Lung Disease: Yes - Siblings; Seizures: No; Stroke: No; Thyroid Problems: No; Tuberculosis: No; Never smoker; Marital Status - Married; Alcohol Use: Never; Drug Use: No History; Caffeine Use: Daily - tea, soda; Financial Concerns: No; Food, Clothing or Shelter Needs: No; Support System Lacking: No; Transportation Concerns: No Psychologist, prison and probation services) Signed: 05/18/2023 1:10:39 PM By: [name] MD FACS Entered By: [name] on 05/18/2023 12:46:58 -------------------------------------------------------------------------------- SuperBill Details Patient Name: Date of Service: [name], [name] [name] 05/18/2023 Medical Record Number: [id number] Patient Account Number: [id number] Date of Birth/Sex: Treating RN: [birth year] (47 y.o. M) Primary Care Provider: Dorinda [name] Other Clinician: Referring Provider: Treating Provider/Extender: [name] in Treatment: 148 Diagnosis Coding ICD-10 Codes Code Description [phone] Non-pressure chronic  wound bed as instructed Secondary Dressing: Drawtex 4x4 in 1 x Per Day/30 Days Discharge Instructions: Apply over primary dressing as directed. Secondary Dressing: Optifoam Non-Adhesive Dressing, 4x4 in 1 x Per Day/30 Days Discharge Instructions: Apply over primary dressing as directed. Secured With: [employer], 4.5x3.1 (in/yd) 1 x Per Day/30 Days Discharge Instructions: Secure with Kerlix as directed. 05/18/2023: Both wounds are smaller today. The right foot is down [name] just a sliver of opening. I used a curette [name] debride callus from the right foot and callus and slough from the left foot. We will continue topical gentamicin with silver alginate, drawtex, and foam donut offloading bilaterally. Follow-up in 1 week. [name], [name] ([id number]) [id number] Page 17 of 19 Electronic Signature(s) Signed: 05/18/2023 12:48:49 PM By: [name] MD FACS Entered By: [name] on 05/18/2023 12:48:49 -------------------------------------------------------------------------------- HxROS Details Patient Name: Date of Service: [name], [name] [place] E. 05/18/2023 11:00 A M Medical Record Number: [id number] Patient Account Number: [id number] Date of Birth/Sex: Treating RN: 1975/08/26 (47 y.o. M) Primary Care Provider: Dorinda [name] Other Clinician: Referring Provider: Treating Provider/Extender: [name] in Treatment:  148 Information Obtained From Patient Constitutional Symptoms (General Health) Medical History: Past Medical History Notes: COVID PNA 07/22/2019-11/14/2019 VENT ECMO, foot drop left foot , Eyes Medical History: Negative for: Cataracts; Glaucoma; Optic Neuritis Ear/Nose/Mouth/Throat Medical History: Negative for: Chronic sinus problems/congestion; Middle ear problems Hematologic/Lymphatic Medical History: Negative for: Anemia; Hemophilia; Human Immunodeficiency Virus; Lymphedema; Sickle Cell Disease Respiratory Medical History: Positive for: Asthma Negative for: Aspiration; Chronic Obstructive Pulmonary Disease (COPD); Pneumothorax; Sleep Apnea; Tuberculosis Cardiovascular Medical History: Positive for: Angina - with COVID; Hypertension Negative for: Arrhythmia; Congestive Heart Failure; Coronary Artery Disease; Deep Vein Thrombosis; Hypotension; Myocardial Infarction; Peripheral Arterial Disease; Peripheral Venous Disease; Phlebitis; Vasculitis Gastrointestinal Medical History: Negative for: Cirrhosis ; Colitis; Crohns; Hepatitis A; Hepatitis B; Hepatitis C Endocrine Medical History: Negative for: Type I Diabetes; Type II Diabetes Genitourinary Medical History: Negative for: End Stage Renal Disease Past Medical History Notes: kidney stone Immunological Medical HistoryDMITRY, [name] ([id number]) [id number] Page 18 of 19 Negative for: Lupus Erythematosus; Raynauds; Scleroderma Integumentary (Skin) Medical History: Negative for: History of Burn Musculoskeletal Medical History: Negative for: Gout; Rheumatoid Arthritis; Osteoarthritis; Osteomyelitis Neurologic Medical History: Negative for: Dementia; Neuropathy; Quadriplegia; Paraplegia; Seizure Disorder Oncologic Medical History: Negative for: Received Chemotherapy; Received Radiation Psychiatric Medical History: Negative for: Anorexia/bulimia; Confinement Anxiety Past Medical History  Notes: anxiety Immunizations Pneumococcal Vaccine: Received Pneumococcal Vaccination: No Implantable Devices None Hospitalization / Surgery History Type of Hospitalization/Surgery COVID PNA 07/22/2019- 11/14/2019 03/27/2020 wound debridement/ skin graft Family and Social History Cancer: Yes - Maternal Grandparents; Diabetes: Yes - Father,Paternal Grandparents; Heart Disease: Yes - Maternal Grandparents; Hereditary Spherocytosis: No; Hypertension: Yes - Father,Paternal Grandparents; Kidney Disease: No; Lung Disease: Yes - Siblings; Seizures: No; Stroke: No; Thyroid Problems: No; Tuberculosis: No; Never smoker; Marital Status - Married; Alcohol Use: Never; Drug Use: No History; Caffeine Use: Daily - tea, soda; Financial Concerns: No; Food, Clothing or Shelter Needs: No; Support System Lacking: No; Transportation Concerns: No Psychologist, prison and probation services) Signed: 05/18/2023 1:10:39 PM By: [name] MD FACS Entered By: [name] on 05/18/2023 12:46:58 -------------------------------------------------------------------------------- SuperBill Details Patient Name: Date of Service: [name], [name] [name] 05/18/2023 Medical Record Number: [id number] Patient Account Number: [id number] Date of Birth/Sex: Treating RN: [birth year] (47 y.o. M) Primary Care Provider: Dorinda [name] Other Clinician: Referring Provider: Treating Provider/Extender: [name] in Treatment: 148 Diagnosis Coding ICD-10 Codes Code Description [phone] Non-pressure chronic

## 2023-05-18 NOTE — Progress Notes (Signed)
Optifoam Non-Adhesive Dressing, 4x4 in Discharge Instruction: Apply over primary dressing as directed. Secured With [employer], 4.5x3.1 (in/yd) Discharge Instruction: Secure with Kerlix as directed. Compression Wrap Compression Stockings Add-Ons AMORY, SIMONETTI ([id number]) [phone].pdf Page 10 of 10 Electronic Signature(s) Signed: 05/18/2023 4:20:59 PM By: Brenton Grills Entered By: Brenton Grills on 05/18/2023 11:44:12 -------------------------------------------------------------------------------- Vitals Details Patient Name: Date of Service: [name], [name] [place] E. 05/18/2023 11:00 A M Medical Record Number: [id number] Patient Account Number: [id number] Date of Birth/Sex: Treating RN: 23-Jan-1976 (47 y.o. [name] Primary Care Keithan Dileonardo: Dorinda Hill Other Clinician: Referring Davyd Podgorski: Treating Masyn Fullam/Extender: Jana Hakim in  Treatment: 148 Vital Signs Time Taken: 11:36 Temperature (F): 99.1 Height (in): 69 Pulse (bpm): 68 Weight (lbs): 280 Respiratory Rate (breaths/min): 18 Body Mass Index (BMI): 41.3 Blood Pressure (mmHg): 130/69 Reference Range: 80 - 120 mg / dl Electronic Signature(s) Signed: 05/18/2023 4:20:59 PM By: Brenton Grills Entered By: Brenton Grills on 05/18/2023 11:36:44  Classification: Category/Stage III Wound Margin: Distinct, outline attached Exudate Amount: Medium Exudate Type: Serosanguineous Exudate Color: red, brown Foul Odor After Cleansing: No Slough/Fibrino No Wound Bed Granulation Amount: Large (67-100%) Exposed Structure Granulation Quality: Pink Fascia Exposed: No Necrotic Amount: Small (1-33%) Fat Layer (Subcutaneous Tissue) Exposed: Yes Necrotic Quality: Adherent Slough Tendon Exposed: No Muscle Exposed: No Joint Exposed: No Bone Exposed: No Periwound Skin Texture Texture Color No Abnormalities Noted: No No Abnormalities Noted: Yes Callus: Yes Temperature / Pain Crepitus: No Temperature: No Abnormality Excoriation: No Tenderness on Palpation: Yes Induration: No Rash: No Scarring: Yes Moisture No Abnormalities Noted: Yes Treatment Notes Wound #2 (Calcaneus) Wound Laterality: Left Cleanser Normal Saline Discharge Instruction: Cleanse the wound with Normal Saline prior [name] applying a clean dressing using gauze sponges, not tissue or cotton balls. JIHAD, BROWNLOW ([id number]) [phone].pdf Page 8 of 10 Wound Cleanser Discharge Instruction: Cleanse the wound with wound cleanser prior [name] applying a clean dressing using gauze sponges, not tissue or cotton balls. Peri-Wound Care Ketoconazole Cream 2% Discharge Instruction: Apply Ketoconazole mixed with zinc [name] the periwound Zinc Oxide Ointment 30g tube Discharge Instruction: Apply Zinc Oxide [name] periwound with each dressing change Topical Gentamicin Discharge Instruction: apply [name] the wound bed Primary Dressing Maxorb Extra Ag+ Alginate  Dressing, 4x4.75 (in/in) Discharge Instruction: Apply [name] wound bed as instructed Secondary Dressing Drawtex 4x4 in Discharge Instruction: Apply over primary dressing as directed. Optifoam Non-Adhesive Dressing, 4x4 in Discharge Instruction: Apply over primary dressing as directed. Woven Gauze Sponge, Non-Sterile 4x4 in Discharge Instruction: Apply over primary dressing as directed. Secured With 55M Medipore H Soft Cloth Surgical T ape, 4 x 10 (in/yd) Discharge Instruction: Secure with tape as directed. Compression Wrap Kerlix Roll 4.5x3.1 (in/yd) Discharge Instruction: Apply Kerlix and Coban compression as directed. Compression Stockings Add-Ons [employer] Offloading Boot, Size Regular Electronic Signature(s) Signed: 05/18/2023 4:20:59 PM By: Brenton Grills Entered By: Brenton Grills on 05/18/2023 11:42:52 -------------------------------------------------------------------------------- Wound Assessment Details Patient Name: Date of Service: [name], [name] [name] 05/18/2023 11:00 A M Medical Record Number: [id number] Patient Account Number: [id number] Date of Birth/Sex: Treating RN: 16-Sep-1975 (47 y.o. [name] Primary Care Lynell Greenhouse: Dorinda Hill Other Clinician: Referring Milianna Ericsson: Treating Maelee Hoot/Extender: Jana Hakim in Treatment: 148 Wound Status Wound Number: 5 Primary Etiology: Pressure Ulcer Wound Location: Right, Plantar Foot Wound Status: Open Wounding Event: Gradually Appeared Comorbid History: Asthma, Angina, Hypertension Date Acquired: 04/27/2023 Weeks Of Treatment: 3 Clustered Wound: No Photos JHAIR, WITHERINGTON ([id number]) [phone].pdf Page 9 of 10 Wound Measurements Length: (cm) 1 Width: (cm) 0.1 Depth: (cm) 0.3 Area: (cm) 0.079 Volume: (cm) 0.024 % Reduction in Area: -154.8% % Reduction in Volume: -700% Epithelialization: Small (1-33%) Tunneling: No Undermining: No Wound  Description Classification: Category/Stage II Wound Margin: Distinct, outline attached Exudate Amount: Medium Exudate Type: Serosanguineous Exudate Color: red, brown Foul Odor After Cleansing: No Slough/Fibrino Yes Wound Bed Granulation Amount: Large (67-100%) Exposed Structure Granulation Quality: Red Fat Layer (Subcutaneous Tissue) Exposed: Yes Necrotic Amount: None Present (0%) Periwound Skin Texture Texture Color No Abnormalities Noted: No No Abnormalities Noted: Yes Callus: Yes Temperature / Pain Rash: No Temperature: No Abnormality Scarring: Yes Moisture No Abnormalities Noted: No Dry / Scaly: Yes Treatment Notes Wound #5 (Foot) Wound Laterality: Plantar, Right Cleanser Peri-Wound Care Topical Gentamicin Discharge Instruction: As directed by physician Primary Dressing Maxorb Extra Ag+ Alginate Dressing, 4x4.75 (in/in) Discharge Instruction: Apply [name] wound bed as instructed Secondary Dressing Drawtex 4x4 in Discharge Instruction: Apply over primary dressing as directed.  Roll Sterile, 4.5x3.1 (in/yd) Discharge Instruction: Secure with Kerlix as directed. Compression Wrap Compression Stockings Add-Ons Electronic Signature(s) Signed: 05/18/2023 12:43:53 PM By: [name] MD [name] ([id number]) [id number] Page 5 of 10 Entered By: [name] on 05/18/2023 12:43:53 -------------------------------------------------------------------------------- Multi-Disciplinary Care Plan Details Patient Name: Date of Service: [name] Leach [name] 05/18/2023 11:00 A M Medical Record Number: [id number] Patient Account Number: [id number] Date of Birth/Sex: Treating RN: 10/29/1975 (47 y.o. [name] Primary Care Rupert Azzara: Dorinda Hill Other Clinician: Referring Maxden Naji: Treating Chelle Cayton/Extender: Jana Hakim in Treatment: 148 Multidisciplinary Care Plan reviewed with physician Active Inactive Wound/Skin Impairment Nursing Diagnoses: Impaired tissue integrity Knowledge deficit related [name] ulceration/compromised skin integrity Goals: Patient/caregiver will verbalize understanding of skin care regimen Date Initiated: 07/15/2020 Target Resolution Date: 05/08/2023 Goal Status: Active Ulcer/skin breakdown will have a volume reduction of 30% by week 4 Date Initiated: 07/15/2020 Date Inactivated: 08/20/2020 Target Resolution Date: 09/03/2020 Goal Status: Unmet Unmet Reason: no major changes. Ulcer/skin breakdown will heal within 14 weeks Date Initiated: 12/04/2020 Date Inactivated: 12/10/2020 Target Resolution Date: 12/10/2020 Unmet Reason: wounds still open at 14 Goal Status: Unmet weeks and today 21 weeks. Interventions: Assess patient/caregiver ability [name] obtain necessary supplies Assess patient/caregiver ability [name] perform ulcer/skin care regimen upon admission and as needed Assess ulceration(s) every visit Provide education on ulcer and skin care Treatment Activities: Skin care regimen initiated : 07/15/2020 Topical wound management initiated : 07/15/2020 Notes: Electronic Signature(s) Signed: 05/18/2023 4:20:59 PM By: Brenton Grills Entered By: Brenton Grills on 05/18/2023 11:47:13 -------------------------------------------------------------------------------- Pain Assessment Details Patient Name: Date of Service: [name], [name] [place] E. 05/18/2023 11:00 A M Medical Record Number: [id number] Patient Account Number: [id number] Date of Birth/Sex: Treating RN: [birth year] (47 y.o. [name] Primary Care Aivy Akter: Dorinda Hill Other Clinician: Referring Jordynne Mccown: Treating Noe Pittsley/Extender: Jana Hakim in Treatment:  [address] ([id number]) [id number] Page 6 of 10 Active Problems Location of Pain Severity and Description of Pain Patient Has Paino No Site Locations Pain Management and Medication Current Pain Management: Electronic Signature(s) Signed: 05/18/2023 4:20:59 PM By: Brenton Grills Entered By: Brenton Grills on 05/18/2023 11:36:56 -------------------------------------------------------------------------------- Patient/Caregiver Education Details Patient Name: Date of Service: [name], [name] [place] 10/10/2024andnbsp11:00 A M Medical Record Number: [id number] Patient Account Number: [id number] Date of Birth/Gender: Treating RN: [birth year] (47 y.o. [name] Primary Care Physician: Dorinda Hill Other Clinician: Referring Physician: Treating Physician/Extender: Jana Hakim in Treatment: 148 Education Assessment Education Provided [name]: Patient and Caregiver Education Topics Provided Wound/Skin Impairment: Methods: Explain/Verbal Responses: State content correctly Electronic Signature(s) Signed: 05/18/2023 4:20:59 PM By: Brenton Grills Entered By: Brenton Grills on 05/18/2023 11:47:31 [name] ([id number]) [id number] Page 7 of 10 -------------------------------------------------------------------------------- Wound Assessment Details Patient Name: Date of Service: [name] Leach [name] 05/18/2023 11:00 A M Medical Record Number: [id number] Patient Account Number: [id number] Date of Birth/Sex: Treating RN: 1975-08-20 (47 y.o. [name] Primary Care Ario Mcdiarmid: Dorinda Hill Other Clinician: Referring Jourdan Maldonado: Treating Ifrah Vest/Extender: Jana Hakim in Treatment: 148 Wound Status Wound Number: 2 Primary Etiology: Pressure Ulcer Wound Location: Left Calcaneus Wound Status: Open Wounding Event: Pressure Injury Comorbid History: Asthma, Angina, Hypertension Date  Acquired: 10/07/2019 Weeks Of Treatment: 148 Clustered Wound: No Photos Wound Measurements Length: (cm) 2.8 Width: (cm) 1 Depth: (cm) 0.1 Area: (cm) 2.199 Volume: (cm) 0.22 % Reduction in Area: 91.9% % Reduction in Volume: 99.2% Epithelialization: Small (1-33%) Tunneling: No Undermining: No Wound Description  Roll Sterile, 4.5x3.1 (in/yd) Discharge Instruction: Secure with Kerlix as directed. Compression Wrap Compression Stockings Add-Ons Electronic Signature(s) Signed: 05/18/2023 12:43:53 PM By: [name] MD [name] ([id number]) [id number] Page 5 of 10 Entered By: [name] on 05/18/2023 12:43:53 -------------------------------------------------------------------------------- Multi-Disciplinary Care Plan Details Patient Name: Date of Service: [name] Leach [name] 05/18/2023 11:00 A M Medical Record Number: [id number] Patient Account Number: [id number] Date of Birth/Sex: Treating RN: 10/29/1975 (47 y.o. [name] Primary Care Rupert Azzara: Dorinda Hill Other Clinician: Referring Maxden Naji: Treating Chelle Cayton/Extender: Jana Hakim in Treatment: 148 Multidisciplinary Care Plan reviewed with physician Active Inactive Wound/Skin Impairment Nursing Diagnoses: Impaired tissue integrity Knowledge deficit related [name] ulceration/compromised skin integrity Goals: Patient/caregiver will verbalize understanding of skin care regimen Date Initiated: 07/15/2020 Target Resolution Date: 05/08/2023 Goal Status: Active Ulcer/skin breakdown will have a volume reduction of 30% by week 4 Date Initiated: 07/15/2020 Date Inactivated: 08/20/2020 Target Resolution Date: 09/03/2020 Goal Status: Unmet Unmet Reason: no major changes. Ulcer/skin breakdown will heal within 14 weeks Date Initiated: 12/04/2020 Date Inactivated: 12/10/2020 Target Resolution Date: 12/10/2020 Unmet Reason: wounds still open at 14 Goal Status: Unmet weeks and today 21 weeks. Interventions: Assess patient/caregiver ability [name] obtain necessary supplies Assess patient/caregiver ability [name] perform ulcer/skin care regimen upon admission and as needed Assess ulceration(s) every visit Provide education on ulcer and skin care Treatment Activities: Skin care regimen initiated : 07/15/2020 Topical wound management initiated : 07/15/2020 Notes: Electronic Signature(s) Signed: 05/18/2023 4:20:59 PM By: Brenton Grills Entered By: Brenton Grills on 05/18/2023 11:47:13 -------------------------------------------------------------------------------- Pain Assessment Details Patient Name: Date of Service: [name], [name] [place] E. 05/18/2023 11:00 A M Medical Record Number: [id number] Patient Account Number: [id number] Date of Birth/Sex: Treating RN: [birth year] (47 y.o. [name] Primary Care Aivy Akter: Dorinda Hill Other Clinician: Referring Jordynne Mccown: Treating Noe Pittsley/Extender: Jana Hakim in Treatment:  [address] ([id number]) [id number] Page 6 of 10 Active Problems Location of Pain Severity and Description of Pain Patient Has Paino No Site Locations Pain Management and Medication Current Pain Management: Electronic Signature(s) Signed: 05/18/2023 4:20:59 PM By: Brenton Grills Entered By: Brenton Grills on 05/18/2023 11:36:56 -------------------------------------------------------------------------------- Patient/Caregiver Education Details Patient Name: Date of Service: [name], [name] [place] 10/10/2024andnbsp11:00 A M Medical Record Number: [id number] Patient Account Number: [id number] Date of Birth/Gender: Treating RN: [birth year] (47 y.o. [name] Primary Care Physician: Dorinda Hill Other Clinician: Referring Physician: Treating Physician/Extender: Jana Hakim in Treatment: 148 Education Assessment Education Provided [name]: Patient and Caregiver Education Topics Provided Wound/Skin Impairment: Methods: Explain/Verbal Responses: State content correctly Electronic Signature(s) Signed: 05/18/2023 4:20:59 PM By: Brenton Grills Entered By: Brenton Grills on 05/18/2023 11:47:31 [name] ([id number]) [id number] Page 7 of 10 -------------------------------------------------------------------------------- Wound Assessment Details Patient Name: Date of Service: [name] Leach [name] 05/18/2023 11:00 A M Medical Record Number: [id number] Patient Account Number: [id number] Date of Birth/Sex: Treating RN: 1975-08-20 (47 y.o. [name] Primary Care Ario Mcdiarmid: Dorinda Hill Other Clinician: Referring Jourdan Maldonado: Treating Ifrah Vest/Extender: Jana Hakim in Treatment: 148 Wound Status Wound Number: 2 Primary Etiology: Pressure Ulcer Wound Location: Left Calcaneus Wound Status: Open Wounding Event: Pressure Injury Comorbid History: Asthma, Angina, Hypertension Date  Acquired: 10/07/2019 Weeks Of Treatment: 148 Clustered Wound: No Photos Wound Measurements Length: (cm) 2.8 Width: (cm) 1 Depth: (cm) 0.1 Area: (cm) 2.199 Volume: (cm) 0.22 % Reduction in Area: 91.9% % Reduction in Volume: 99.2% Epithelialization: Small (1-33%) Tunneling: No Undermining: No Wound Description  Classification: Category/Stage III Wound Margin: Distinct, outline attached Exudate Amount: Medium Exudate Type: Serosanguineous Exudate Color: red, brown Foul Odor After Cleansing: No Slough/Fibrino No Wound Bed Granulation Amount: Large (67-100%) Exposed Structure Granulation Quality: Pink Fascia Exposed: No Necrotic Amount: Small (1-33%) Fat Layer (Subcutaneous Tissue) Exposed: Yes Necrotic Quality: Adherent Slough Tendon Exposed: No Muscle Exposed: No Joint Exposed: No Bone Exposed: No Periwound Skin Texture Texture Color No Abnormalities Noted: No No Abnormalities Noted: Yes Callus: Yes Temperature / Pain Crepitus: No Temperature: No Abnormality Excoriation: No Tenderness on Palpation: Yes Induration: No Rash: No Scarring: Yes Moisture No Abnormalities Noted: Yes Treatment Notes Wound #2 (Calcaneus) Wound Laterality: Left Cleanser Normal Saline Discharge Instruction: Cleanse the wound with Normal Saline prior [name] applying a clean dressing using gauze sponges, not tissue or cotton balls. JIHAD, BROWNLOW ([id number]) [phone].pdf Page 8 of 10 Wound Cleanser Discharge Instruction: Cleanse the wound with wound cleanser prior [name] applying a clean dressing using gauze sponges, not tissue or cotton balls. Peri-Wound Care Ketoconazole Cream 2% Discharge Instruction: Apply Ketoconazole mixed with zinc [name] the periwound Zinc Oxide Ointment 30g tube Discharge Instruction: Apply Zinc Oxide [name] periwound with each dressing change Topical Gentamicin Discharge Instruction: apply [name] the wound bed Primary Dressing Maxorb Extra Ag+ Alginate  Dressing, 4x4.75 (in/in) Discharge Instruction: Apply [name] wound bed as instructed Secondary Dressing Drawtex 4x4 in Discharge Instruction: Apply over primary dressing as directed. Optifoam Non-Adhesive Dressing, 4x4 in Discharge Instruction: Apply over primary dressing as directed. Woven Gauze Sponge, Non-Sterile 4x4 in Discharge Instruction: Apply over primary dressing as directed. Secured With 55M Medipore H Soft Cloth Surgical T ape, 4 x 10 (in/yd) Discharge Instruction: Secure with tape as directed. Compression Wrap Kerlix Roll 4.5x3.1 (in/yd) Discharge Instruction: Apply Kerlix and Coban compression as directed. Compression Stockings Add-Ons [employer] Offloading Boot, Size Regular Electronic Signature(s) Signed: 05/18/2023 4:20:59 PM By: Brenton Grills Entered By: Brenton Grills on 05/18/2023 11:42:52 -------------------------------------------------------------------------------- Wound Assessment Details Patient Name: Date of Service: [name], [name] [name] 05/18/2023 11:00 A M Medical Record Number: [id number] Patient Account Number: [id number] Date of Birth/Sex: Treating RN: 16-Sep-1975 (47 y.o. [name] Primary Care Lynell Greenhouse: Dorinda Hill Other Clinician: Referring Milianna Ericsson: Treating Maelee Hoot/Extender: Jana Hakim in Treatment: 148 Wound Status Wound Number: 5 Primary Etiology: Pressure Ulcer Wound Location: Right, Plantar Foot Wound Status: Open Wounding Event: Gradually Appeared Comorbid History: Asthma, Angina, Hypertension Date Acquired: 04/27/2023 Weeks Of Treatment: 3 Clustered Wound: No Photos JHAIR, WITHERINGTON ([id number]) [phone].pdf Page 9 of 10 Wound Measurements Length: (cm) 1 Width: (cm) 0.1 Depth: (cm) 0.3 Area: (cm) 0.079 Volume: (cm) 0.024 % Reduction in Area: -154.8% % Reduction in Volume: -700% Epithelialization: Small (1-33%) Tunneling: No Undermining: No Wound  Description Classification: Category/Stage II Wound Margin: Distinct, outline attached Exudate Amount: Medium Exudate Type: Serosanguineous Exudate Color: red, brown Foul Odor After Cleansing: No Slough/Fibrino Yes Wound Bed Granulation Amount: Large (67-100%) Exposed Structure Granulation Quality: Red Fat Layer (Subcutaneous Tissue) Exposed: Yes Necrotic Amount: None Present (0%) Periwound Skin Texture Texture Color No Abnormalities Noted: No No Abnormalities Noted: Yes Callus: Yes Temperature / Pain Rash: No Temperature: No Abnormality Scarring: Yes Moisture No Abnormalities Noted: No Dry / Scaly: Yes Treatment Notes Wound #5 (Foot) Wound Laterality: Plantar, Right Cleanser Peri-Wound Care Topical Gentamicin Discharge Instruction: As directed by physician Primary Dressing Maxorb Extra Ag+ Alginate Dressing, 4x4.75 (in/in) Discharge Instruction: Apply [name] wound bed as instructed Secondary Dressing Drawtex 4x4 in Discharge Instruction: Apply over primary dressing as directed.

## 2023-05-19 DIAGNOSIS — S61213A Laceration without foreign body of left middle finger without damage to nail, initial encounter: Secondary | ICD-10-CM | POA: Diagnosis not present

## 2023-05-19 DIAGNOSIS — M79645 Pain in left finger(s): Secondary | ICD-10-CM | POA: Diagnosis not present

## 2023-05-25 ENCOUNTER — Encounter (HOSPITAL_BASED_OUTPATIENT_CLINIC_OR_DEPARTMENT_OTHER): Payer: PPO | Admitting: General Surgery

## 2023-05-25 DIAGNOSIS — L97512 Non-pressure chronic ulcer of other part of right foot with fat layer exposed: Secondary | ICD-10-CM | POA: Diagnosis not present

## 2023-05-25 DIAGNOSIS — L89623 Pressure ulcer of left heel, stage 3: Secondary | ICD-10-CM | POA: Diagnosis not present

## 2023-05-25 DIAGNOSIS — L89892 Pressure ulcer of other site, stage 2: Secondary | ICD-10-CM | POA: Diagnosis not present

## 2023-05-26 NOTE — Progress Notes (Signed)
dressing as directed. Optifoam Non-Adhesive Dressing, 4x4 in Discharge Instruction: Apply over primary dressing as directed. Secured With [employer], 4.5x3.1 (in/yd) Discharge Instruction: Secure with Kerlix as directed. Compression Wrap Compression Stockings Add-Ons [name], [place] ([id number]) [phone].pdf Page 10 of 10 Electronic Signature(s) Signed: 05/26/2023 12:08:28 PM By: Brenton Grills Entered By: Brenton Grills on 05/25/2023 07:10:25 -------------------------------------------------------------------------------- Vitals Details Patient Name: Date of Service: [name], [name] 05/25/2023 10:15 A M Medical Record Number: [id number] Patient Account Number: [id number] Date of Birth/Sex: Treating RN: 1975/11/24 (48 [name] Primary Care [name]: [name] Other Clinician: Referring [name]: Treating  [name]/Extender: [name] in Treatment: 149 Vital Signs Time Taken: 10:01 Temperature (F): 98.7 Height (in): 69 Pulse (bpm): 75 Weight (lbs): 280 Respiratory Rate (breaths/min): 18 Body Mass Index (BMI): 41.3 Blood Pressure (mmHg): 124/68 Reference Range: 80 - 120 mg / dl Electronic Signature(s) Signed: 05/26/2023 12:08:28 PM By: Brenton Grills Entered By: Brenton Grills on 05/25/2023 07:01:35  No Wound Description Classification: Category/Stage III Wound Margin: Distinct, outline attached Exudate Amount: Medium Exudate Type: Serosanguineous Exudate Color: red, brown Foul Odor After Cleansing: No Slough/Fibrino No Wound Bed Granulation Amount: Large (67-100%) Exposed Structure Granulation Quality: Pink Fascia Exposed: No Necrotic Amount: Small (1-33%) Fat Layer (Subcutaneous Tissue) Exposed: Yes Necrotic Quality: Adherent Slough Tendon Exposed: No Muscle Exposed: No Joint Exposed: No Bone Exposed: No Periwound Skin Texture Texture Color No Abnormalities Noted: No No Abnormalities Noted: Yes Callus: Yes Temperature / Pain Crepitus: No Temperature: No Abnormality Excoriation: No Tenderness on Palpation: Yes Induration: No Rash: No Scarring: Yes Moisture No Abnormalities Noted: Yes Treatment Notes Wound #2 (Calcaneus) Wound Laterality: Left Cleanser Normal Saline Discharge Instruction: Cleanse the wound with Normal Saline prior to applying a clean dressing using gauze sponges, not tissue or cotton balls. [name], [name] ([id number]) [id number] Page 8 of 10 Wound Cleanser Discharge Instruction: Cleanse the wound with wound cleanser prior to applying a clean dressing using gauze sponges, not tissue or cotton balls. Peri-Wound Care Ketoconazole Cream 2% Discharge Instruction: Apply Ketoconazole mixed with zinc to the periwound Zinc Oxide Ointment 30g tube Discharge Instruction: Apply Zinc Oxide to periwound with each dressing change Topical Gentamicin Discharge Instruction:  apply to the wound bed Primary Dressing Maxorb Extra Ag+ Alginate Dressing, 4x4.75 (in/in) Discharge Instruction: Apply to wound bed as instructed Secondary Dressing Drawtex 4x4 in Discharge Instruction: Apply over primary dressing as directed. Optifoam Non-Adhesive Dressing, 4x4 in Discharge Instruction: Apply over primary dressing as directed. Woven Gauze Sponge, Non-Sterile 4x4 in Discharge Instruction: Apply over primary dressing as directed. Secured With 26M Medipore H Soft Cloth Surgical T ape, 4 x 10 (in/yd) Discharge Instruction: Secure with tape as directed. Compression Wrap Kerlix Roll 4.5x3.1 (in/yd) Discharge Instruction: Apply Kerlix and Coban compression as directed. Compression Stockings Add-Ons [name] Vascular Offloading Boot, Size Regular Electronic Signature(s) Signed: 05/26/2023 12:08:28 PM By: Brenton Grills Entered By: Brenton Grills on 05/25/2023 07:09:50 -------------------------------------------------------------------------------- Wound Assessment Details Patient Name: Date of Service: [name], TO [name] 05/25/2023 10:15 A M Medical Record Number: [id number] Patient Account Number: [id number] Date of Birth/Sex: Treating RN: 02-22-1976 (54 [name] Primary Care [name]: [name] Other Clinician: Referring [name]: Treating [name]/Extender: [name] in Treatment: 149 Wound Status Wound Number: 5 Primary Etiology: Pressure Ulcer Wound Location: Right, Plantar Foot Wound Status: Open Wounding Event: Gradually Appeared Comorbid History: Asthma, Angina, Hypertension Date Acquired: 04/27/2023 Weeks Of Treatment: 4 Clustered Wound: No Photos [name] ([id number]) [id number] Page 9 of 10 Wound Measurements Length: (cm) 1 Width: (cm) 0.1 Depth: (cm) 0.2 Area: (cm) 0.079 Volume: (cm) 0.016 % Reduction in Area: -154.8% % Reduction in Volume: -433.3% Epithelialization: Small  (1-33%) Tunneling: No Undermining: No Wound Description Classification: Category/Stage II Wound Margin: Distinct, outline attached Exudate Amount: Medium Exudate Type: Serosanguineous Exudate Color: red, brown Foul Odor After Cleansing: No Slough/Fibrino Yes Wound Bed Granulation Amount: Large (67-100%) Exposed Structure Granulation Quality: Red Fat Layer (Subcutaneous Tissue) Exposed: Yes Necrotic Amount: None Present (0%) Periwound Skin Texture Texture Color No Abnormalities Noted: No No Abnormalities Noted: Yes Callus: Yes Temperature / Pain Rash: No Temperature: No Abnormality Scarring: Yes Moisture No Abnormalities Noted: No Dry / Scaly: Yes Treatment Notes Wound #5 (Foot) Wound Laterality: Plantar, Right Cleanser Peri-Wound Care Topical Gentamicin Discharge Instruction: As directed by physician Primary Dressing Maxorb Extra Ag+ Alginate Dressing, 4x4.75 (in/in) Discharge Instruction: Apply to wound bed as instructed Secondary Dressing Drawtex 4x4 in Discharge Instruction: Apply over primary  dressing as directed. Optifoam Non-Adhesive Dressing, 4x4 in Discharge Instruction: Apply over primary dressing as directed. Secured With [employer], 4.5x3.1 (in/yd) Discharge Instruction: Secure with Kerlix as directed. Compression Wrap Compression Stockings Add-Ons [name], [place] ([id number]) [phone].pdf Page 10 of 10 Electronic Signature(s) Signed: 05/26/2023 12:08:28 PM By: Brenton Grills Entered By: Brenton Grills on 05/25/2023 07:10:25 -------------------------------------------------------------------------------- Vitals Details Patient Name: Date of Service: [name], [name] 05/25/2023 10:15 A M Medical Record Number: [id number] Patient Account Number: [id number] Date of Birth/Sex: Treating RN: 1975/11/24 (48 [name] Primary Care [name]: [name] Other Clinician: Referring [name]: Treating  [name]/Extender: [name] in Treatment: 149 Vital Signs Time Taken: 10:01 Temperature (F): 98.7 Height (in): 69 Pulse (bpm): 75 Weight (lbs): 280 Respiratory Rate (breaths/min): 18 Body Mass Index (BMI): 41.3 Blood Pressure (mmHg): 124/68 Reference Range: 80 - 120 mg / dl Electronic Signature(s) Signed: 05/26/2023 12:08:28 PM By: Brenton Grills Entered By: Brenton Grills on 05/25/2023 07:01:35  No Wound Description Classification: Category/Stage III Wound Margin: Distinct, outline attached Exudate Amount: Medium Exudate Type: Serosanguineous Exudate Color: red, brown Foul Odor After Cleansing: No Slough/Fibrino No Wound Bed Granulation Amount: Large (67-100%) Exposed Structure Granulation Quality: Pink Fascia Exposed: No Necrotic Amount: Small (1-33%) Fat Layer (Subcutaneous Tissue) Exposed: Yes Necrotic Quality: Adherent Slough Tendon Exposed: No Muscle Exposed: No Joint Exposed: No Bone Exposed: No Periwound Skin Texture Texture Color No Abnormalities Noted: No No Abnormalities Noted: Yes Callus: Yes Temperature / Pain Crepitus: No Temperature: No Abnormality Excoriation: No Tenderness on Palpation: Yes Induration: No Rash: No Scarring: Yes Moisture No Abnormalities Noted: Yes Treatment Notes Wound #2 (Calcaneus) Wound Laterality: Left Cleanser Normal Saline Discharge Instruction: Cleanse the wound with Normal Saline prior to applying a clean dressing using gauze sponges, not tissue or cotton balls. [name], [name] ([id number]) [id number].pdf Page 8 of 10 Wound Cleanser Discharge Instruction: Cleanse the wound with wound cleanser prior to applying a clean dressing using gauze sponges, not tissue or cotton balls. Peri-Wound Care Ketoconazole Cream 2% Discharge Instruction: Apply Ketoconazole mixed with zinc to the periwound Zinc Oxide Ointment 30g tube Discharge Instruction: Apply Zinc Oxide to periwound with each dressing change Topical Gentamicin Discharge Instruction:  apply to the wound bed Primary Dressing Maxorb Extra Ag+ Alginate Dressing, 4x4.75 (in/in) Discharge Instruction: Apply to wound bed as instructed Secondary Dressing Drawtex 4x4 in Discharge Instruction: Apply over primary dressing as directed. Optifoam Non-Adhesive Dressing, 4x4 in Discharge Instruction: Apply over primary dressing as directed. Woven Gauze Sponge, Non-Sterile 4x4 in Discharge Instruction: Apply over primary dressing as directed. Secured With 26M Medipore H Soft Cloth Surgical T ape, 4 x 10 (in/yd) Discharge Instruction: Secure with tape as directed. Compression Wrap Kerlix Roll 4.5x3.1 (in/yd) Discharge Instruction: Apply Kerlix and Coban compression as directed. Compression Stockings Add-Ons [name] Vascular Offloading Boot, Size Regular Electronic Signature(s) Signed: 05/26/2023 12:08:28 PM By: Brenton Grills Entered By: Brenton Grills on 05/25/2023 07:09:50 -------------------------------------------------------------------------------- Wound Assessment Details Patient Name: Date of Service: [name], TO [name] 05/25/2023 10:15 A M Medical Record Number: [id number] Patient Account Number: [id number] Date of Birth/Sex: Treating RN: 02-22-1976 (54 [name] Primary Care [name]: [name] Other Clinician: Referring [name]: Treating [name]/Extender: [name] in Treatment: 149 Wound Status Wound Number: 5 Primary Etiology: Pressure Ulcer Wound Location: Right, Plantar Foot Wound Status: Open Wounding Event: Gradually Appeared Comorbid History: Asthma, Angina, Hypertension Date Acquired: 04/27/2023 Weeks Of Treatment: 4 Clustered Wound: No Photos [name] ([id number]) [id number] Page 9 of 10 Wound Measurements Length: (cm) 1 Width: (cm) 0.1 Depth: (cm) 0.2 Area: (cm) 0.079 Volume: (cm) 0.016 % Reduction in Area: -154.8% % Reduction in Volume: -433.3% Epithelialization: Small  (1-33%) Tunneling: No Undermining: No Wound Description Classification: Category/Stage II Wound Margin: Distinct, outline attached Exudate Amount: Medium Exudate Type: Serosanguineous Exudate Color: red, brown Foul Odor After Cleansing: No Slough/Fibrino Yes Wound Bed Granulation Amount: Large (67-100%) Exposed Structure Granulation Quality: Red Fat Layer (Subcutaneous Tissue) Exposed: Yes Necrotic Amount: None Present (0%) Periwound Skin Texture Texture Color No Abnormalities Noted: No No Abnormalities Noted: Yes Callus: Yes Temperature / Pain Rash: No Temperature: No Abnormality Scarring: Yes Moisture No Abnormalities Noted: No Dry / Scaly: Yes Treatment Notes Wound #5 (Foot) Wound Laterality: Plantar, Right Cleanser Peri-Wound Care Topical Gentamicin Discharge Instruction: As directed by physician Primary Dressing Maxorb Extra Ag+ Alginate Dressing, 4x4.75 (in/in) Discharge Instruction: Apply to wound bed as instructed Secondary Dressing Drawtex 4x4 in Discharge Instruction: Apply over primary  No Wound Description Classification: Category/Stage III Wound Margin: Distinct, outline attached Exudate Amount: Medium Exudate Type: Serosanguineous Exudate Color: red, brown Foul Odor After Cleansing: No Slough/Fibrino No Wound Bed Granulation Amount: Large (67-100%) Exposed Structure Granulation Quality: Pink Fascia Exposed: No Necrotic Amount: Small (1-33%) Fat Layer (Subcutaneous Tissue) Exposed: Yes Necrotic Quality: Adherent Slough Tendon Exposed: No Muscle Exposed: No Joint Exposed: No Bone Exposed: No Periwound Skin Texture Texture Color No Abnormalities Noted: No No Abnormalities Noted: Yes Callus: Yes Temperature / Pain Crepitus: No Temperature: No Abnormality Excoriation: No Tenderness on Palpation: Yes Induration: No Rash: No Scarring: Yes Moisture No Abnormalities Noted: Yes Treatment Notes Wound #2 (Calcaneus) Wound Laterality: Left Cleanser Normal Saline Discharge Instruction: Cleanse the wound with Normal Saline prior to applying a clean dressing using gauze sponges, not tissue or cotton balls. [name], [name] ([id number]) [id number].pdf Page 8 of 10 Wound Cleanser Discharge Instruction: Cleanse the wound with wound cleanser prior to applying a clean dressing using gauze sponges, not tissue or cotton balls. Peri-Wound Care Ketoconazole Cream 2% Discharge Instruction: Apply Ketoconazole mixed with zinc to the periwound Zinc Oxide Ointment 30g tube Discharge Instruction: Apply Zinc Oxide to periwound with each dressing change Topical Gentamicin Discharge Instruction:  apply to the wound bed Primary Dressing Maxorb Extra Ag+ Alginate Dressing, 4x4.75 (in/in) Discharge Instruction: Apply to wound bed as instructed Secondary Dressing Drawtex 4x4 in Discharge Instruction: Apply over primary dressing as directed. Optifoam Non-Adhesive Dressing, 4x4 in Discharge Instruction: Apply over primary dressing as directed. Woven Gauze Sponge, Non-Sterile 4x4 in Discharge Instruction: Apply over primary dressing as directed. Secured With 26M Medipore H Soft Cloth Surgical T ape, 4 x 10 (in/yd) Discharge Instruction: Secure with tape as directed. Compression Wrap Kerlix Roll 4.5x3.1 (in/yd) Discharge Instruction: Apply Kerlix and Coban compression as directed. Compression Stockings Add-Ons [name] Vascular Offloading Boot, Size Regular Electronic Signature(s) Signed: 05/26/2023 12:08:28 PM By: Brenton Grills Entered By: Brenton Grills on 05/25/2023 07:09:50 -------------------------------------------------------------------------------- Wound Assessment Details Patient Name: Date of Service: [name], TO [name] 05/25/2023 10:15 A M Medical Record Number: [id number] Patient Account Number: [id number] Date of Birth/Sex: Treating RN: 02-22-1976 (54 [name] Primary Care [name]: [name] Other Clinician: Referring [name]: Treating [name]/Extender: [name] in Treatment: 149 Wound Status Wound Number: 5 Primary Etiology: Pressure Ulcer Wound Location: Right, Plantar Foot Wound Status: Open Wounding Event: Gradually Appeared Comorbid History: Asthma, Angina, Hypertension Date Acquired: 04/27/2023 Weeks Of Treatment: 4 Clustered Wound: No Photos [name] ([id number]) [id number].pdf Page 9 of 10 Wound Measurements Length: (cm) 1 Width: (cm) 0.1 Depth: (cm) 0.2 Area: (cm) 0.079 Volume: (cm) 0.016 % Reduction in Area: -154.8% % Reduction in Volume: -433.3% Epithelialization: Small  (1-33%) Tunneling: No Undermining: No Wound Description Classification: Category/Stage II Wound Margin: Distinct, outline attached Exudate Amount: Medium Exudate Type: Serosanguineous Exudate Color: red, brown Foul Odor After Cleansing: No Slough/Fibrino Yes Wound Bed Granulation Amount: Large (67-100%) Exposed Structure Granulation Quality: Red Fat Layer (Subcutaneous Tissue) Exposed: Yes Necrotic Amount: None Present (0%) Periwound Skin Texture Texture Color No Abnormalities Noted: No No Abnormalities Noted: Yes Callus: Yes Temperature / Pain Rash: No Temperature: No Abnormality Scarring: Yes Moisture No Abnormalities Noted: No Dry / Scaly: Yes Treatment Notes Wound #5 (Foot) Wound Laterality: Plantar, Right Cleanser Peri-Wound Care Topical Gentamicin Discharge Instruction: As directed by physician Primary Dressing Maxorb Extra Ag+ Alginate Dressing, 4x4.75 (in/in) Discharge Instruction: Apply to wound bed as instructed Secondary Dressing Drawtex 4x4 in Discharge Instruction: Apply over primary

## 2023-05-26 NOTE — Progress Notes (Signed)
surface and minimal eschar around the edges. 03/15/2023: The wound measured narrower today. There is some slough accumulation on the surface and some eschar around the edges. 03/23/2023: The length of the wound remains stable, but it continues to narrow. The skin that has covered the posterior aspect of the calcaneus is more robust and has better tensile integrity. Minimal slough and eschar accumulation. 03/30/2023: The wound length has decreased. This seems to be primarily due to more epithelium covering the posterior aspect of the site. Minimal slough and eschar buildup. 04/07/2023: The wound measured a little bit smaller again today. There is minimal slough accumulation with just a little bit of eschar and callus around the edges. 04/13/2023: The lateral aspect of his wound is starting to epithelialize. There is very minimal slough on the surface. Moisture control is good. 04/20/2023: Continued, albeit extremely slow, encroachment of  epithelium as of the wound surface. Minimal slough. Moisture balance is good. 04/27/2023: More epithelium is filling in from the heel area. There is a little bit of slough on the surface with some periwound callus and eschar. He did note a little bit of drainage coming from his right foot and there is a small crack in the skin extending medially from the main site of his prior ulceration. 05/04/2023: The wounds are stable this week. No significant change. There is a little bit of callus buildup around the edges of each site and some thin slough on the surface on the left foot. 05/11/2023: The skin on the right heel is filling in further. There is a little bit of slough accumulation on both surfaces along with periwound callus accumulation. 05/18/2023: Both wounds are smaller today. The right foot is down to just a sliver of opening. 05/25/2023: Epithelium continues to advance from the calcaneal area towards the midfoot on the left. On the right, there is tiniest crack of an opening that remains. Electronic Signature(s) Signed: 05/25/2023 10:28:40 AM By: [name] MD FACS Entered By: [name] on 05/25/2023 07:28:40 -------------------------------------------------------------------------------- Physical Exam Details Patient Name: Date of Service: [name], [name] 05/25/2023 10:15 A M Medical Record Number: [id number] Patient Account Number: [id number] Date of Birth/Sex: Treating RN: 08-09-1975 (47 y.o. M) Primary Care Provider: Dorinda Hill Other Clinician: Referring Provider: Treating Provider/Extender: Jana Hakim in Treatment: 149 Constitutional . . . . no acute distress. Respiratory Normal work of breathing on room air. Notes 05/25/2023: Epithelium continues to advance from the calcaneal area towards the midfoot on the left. On the right, there is tiniest crack of an opening that remains. Electronic Signature(s) Signed: 05/25/2023 10:29:12 AM By:  [name] MD FACS Entered By: [name] on 05/25/2023 07:29:12 [name] ([id number]) [id number] Page 8 of 19 -------------------------------------------------------------------------------- Physician Orders Details Patient Name: Date of Service: [name] [place] 05/25/2023 10:15 A M Medical Record Number: [id number] Patient Account Number: [id number] Date of Birth/Sex: Treating RN: [birth year] (47 y.o. [name] Primary Care Provider: Dorinda Hill Other Clinician: Referring Provider: Treating Provider/Extender: Jana Hakim in Treatment: 149 The following information was scribed by: Brenton Grills The information was scribed for: [name] Verbal / Phone Orders: No Diagnosis Coding ICD-10 Coding Code Description [phone] Non-pressure chronic ulcer of other part of right foot with fat layer exposed L97.522 Non-pressure chronic ulcer of other part of left foot with fat layer exposed Follow-up Appointments ppointment in 1 week. - Dr. Lady Gary Room 3 - 06/01/2023 at 10:15am Return A ppointment in 2 weeks. -  Woven Gauze Sponge, Non-Sterile 4x4 in (Generic) 1 x Per Day/Other:until healed Discharge Instructions: Apply over primary dressing as directed. Secured With: 65M Medipore H Soft Cloth Surgical T ape, 4 x 10 (in/yd) (Generic) 1 x Per Day/Other:until healed Discharge Instructions: Secure with tape as directed. Com pression Wrap: Kerlix Roll 4.5x3.1 (in/yd) (Generic) 1 x Per Day/Other:until healed Discharge Instructions: Apply Kerlix and Coban compression as directed. Add-Ons: [employer] Offloading Boot, Size Regular 1 x Per Day/Other:until healed WOUND #5: - Foot Wound Laterality: Plantar, Right Topical: Gentamicin 1 x Per Day/30 Days Discharge Instructions: As directed by physician Prim Dressing: Maxorb Extra Ag+ Alginate Dressing, 4x4.75 (in/in) 1 x Per Day/30 Days ary Discharge Instructions: Apply to wound bed as instructed Secondary Dressing: Drawtex 4x4 in 1 x Per Day/30 Days Discharge Instructions: Apply over primary dressing as directed. Secondary Dressing: Optifoam Non-Adhesive Dressing, 4x4 in 1 x Per Day/30 Days Discharge Instructions: Apply over primary dressing as directed. [name], [name] ([id number]) [id number] Page 17 of 19 Secured With: [employer], 4.5x3.1 (in/yd) 1 x Per Day/30 Days Discharge Instructions: Secure with Kerlix as directed. 05/25/2023: Epithelium continues to advance from the calcaneal area towards the midfoot on the left. On the right, there is tiniest crack of an opening that remains. I used a  curette to debride some callus on the right and some slough from the left. We will continue topical gentamicin and silver alginate to both sites. Continue periwound ketoconazole on the left. Continue offloading sandals. Follow-up in 1 week. Electronic Signature(s) Signed: 05/25/2023 6:11:41 PM By: Shawn Stall RN, BSN Signed: 05/26/2023 7:45:11 AM By: [name] MD FACS Previous Signature: 05/25/2023 10:30:21 AM Version By: [name] MD FACS Entered By: Shawn [name] on 05/25/2023 15:07:04 -------------------------------------------------------------------------------- HxROS Details Patient Name: Date of Service: [name], TO [name] 05/25/2023 10:15 A M Medical Record Number: [id number] Patient Account Number: [id number] Date of Birth/Sex: Treating RN: [birth year] (47 y.o. M) Primary Care Provider: Dorinda Hill Other Clinician: Referring Provider: Treating Provider/Extender: Jana Hakim in Treatment: 149 Information Obtained From Patient Constitutional Symptoms (General Health) Medical History: Past Medical History Notes: COVID PNA 07/22/2019-11/14/2019 VENT ECMO, foot drop left foot , Eyes Medical History: Negative for: Cataracts; Glaucoma; Optic Neuritis Ear/Nose/Mouth/Throat Medical History: Negative for: Chronic sinus problems/congestion; Middle ear problems Hematologic/Lymphatic Medical History: Negative for: Anemia; Hemophilia; Human Immunodeficiency Virus; Lymphedema; Sickle Cell Disease Respiratory Medical History: Positive for: Asthma Negative for: Aspiration; Chronic Obstructive Pulmonary Disease (COPD); Pneumothorax; Sleep Apnea; Tuberculosis Cardiovascular Medical History: Positive for: Angina - with COVID; Hypertension Negative for: Arrhythmia; Congestive Heart Failure; Coronary Artery Disease; Deep Vein Thrombosis; Hypotension; Myocardial Infarction; Peripheral Arterial Disease; Peripheral Venous Disease; Phlebitis;  Vasculitis Gastrointestinal Medical History: Negative for: Cirrhosis ; Colitis; Crohns; Hepatitis A; Hepatitis B; Hepatitis [name], [name] ([id number]) [id number] Page 18 of 19 Endocrine Medical History: Negative for: Type I Diabetes; Type II Diabetes Genitourinary Medical History: Negative for: End Stage Renal Disease Past Medical History Notes: kidney stone Immunological Medical History: Negative for: Lupus Erythematosus; Raynauds; Scleroderma Integumentary (Skin) Medical History: Negative for: History of Burn Musculoskeletal Medical History: Negative for: Gout; Rheumatoid Arthritis; Osteoarthritis; Osteomyelitis Neurologic Medical History: Negative for: Dementia; Neuropathy; Quadriplegia; Paraplegia; Seizure Disorder Oncologic Medical History: Negative for: Received Chemotherapy; Received Radiation Psychiatric Medical History: Negative for: Anorexia/bulimia; Confinement Anxiety Past Medical History Notes: anxiety Immunizations Pneumococcal Vaccine: Received Pneumococcal Vaccination: No Implantable Devices None Hospitalization / Surgery History Type of Hospitalization/Surgery COVID PNA 07/22/2019- 11/14/2019 03/27/2020 wound debridement/ skin graft Family and  Social History Cancer: Yes - Maternal Grandparents; Diabetes: Yes - Father,Paternal Grandparents; Heart Disease: Yes - Maternal Grandparents; Hereditary Spherocytosis: No; Hypertension: Yes - Father,Paternal Grandparents; Kidney Disease: No; Lung Disease: Yes - Siblings; Seizures: No; Stroke: No; Thyroid Problems: No; Tuberculosis: No; Never smoker; Marital Status - Married; Alcohol Use: Never; Drug Use: No History; Caffeine Use: Daily - tea, soda; Financial Concerns: No; Food, Clothing or Shelter Needs: No; Support System Lacking: No; Transportation Concerns: No Electronic Signature(s) Signed: 05/25/2023 11:04:27 AM By: [name] MD FACS Entered By: [name] on  05/25/2023 57:84:69 SuperBill Details -------------------------------------------------------------------------------- [name] ([id number]) [id number] Page 19 of 19 Patient Name: Date of Service: [name], TO [place] 05/25/2023 Medical Record Number: [id number] Patient Account Number: [id number] Date of Birth/Sex: Treating RN: 1975/10/18 (46 y.o. M) Primary Care Provider: Dorinda Hill Other Clinician: Referring Provider: Treating Provider/Extender: Jana Hakim in Treatment: 149 Diagnosis Coding ICD-10 Codes Code Description [phone] Non-pressure chronic ulcer of other part of right foot with fat layer exposed L97.522 Non-pressure chronic ulcer of other part of left foot with fat layer exposed Facility Procedures CPT4 Code Description Modifier Quantity [id number] [phone] - DEBRIDE WOUND 1ST 20 SQ CM OR < 1 ICD-10 Diagnosis Description L97.512 Non-pressure chronic ulcer of other part of right foot with fat layer exposed L97.522 Non-pressure chronic ulcer of other part of left foot with fat layer exposed Physician Procedures Quantity CPT4 Code Description Modifier [id number] - WC PHYS LEVEL 4 - EST PT 25 1 ICD-10 Diagnosis Description L97.512 Non-pressure chronic ulcer of other part of right foot with fat layer exposed L97.522 Non-pressure chronic ulcer of other part of left foot with fat layer exposed [phone] - WC PHYS DEBR WO ANESTH 20 SQ CM 1 ICD-10 Diagnosis Description L97.512 Non-pressure chronic ulcer of other part of right foot with fat layer exposed L97.522 Non-pressure chronic ulcer of other part of left foot with fat layer exposed Electronic Signature(s) Signed: 05/25/2023 10:30:40 AM By: [name] MD FACS Entered By: [name] on 05/25/2023 07:30:40  Woven Gauze Sponge, Non-Sterile 4x4 in (Generic) 1 x Per Day/Other:until healed Discharge Instructions: Apply over primary dressing as directed. Secured With: 65M Medipore H Soft Cloth Surgical T ape, 4 x 10 (in/yd) (Generic) 1 x Per Day/Other:until healed Discharge Instructions: Secure with tape as directed. Com pression Wrap: Kerlix Roll 4.5x3.1 (in/yd) (Generic) 1 x Per Day/Other:until healed Discharge Instructions: Apply Kerlix and Coban compression as directed. Add-Ons: [employer] Offloading Boot, Size Regular 1 x Per Day/Other:until healed WOUND #5: - Foot Wound Laterality: Plantar, Right Topical: Gentamicin 1 x Per Day/30 Days Discharge Instructions: As directed by physician Prim Dressing: Maxorb Extra Ag+ Alginate Dressing, 4x4.75 (in/in) 1 x Per Day/30 Days ary Discharge Instructions: Apply to wound bed as instructed Secondary Dressing: Drawtex 4x4 in 1 x Per Day/30 Days Discharge Instructions: Apply over primary dressing as directed. Secondary Dressing: Optifoam Non-Adhesive Dressing, 4x4 in 1 x Per Day/30 Days Discharge Instructions: Apply over primary dressing as directed. [name], [name] ([id number]) [id number] Page 17 of 19 Secured With: [employer], 4.5x3.1 (in/yd) 1 x Per Day/30 Days Discharge Instructions: Secure with Kerlix as directed. 05/25/2023: Epithelium continues to advance from the calcaneal area towards the midfoot on the left. On the right, there is tiniest crack of an opening that remains. I used a  curette to debride some callus on the right and some slough from the left. We will continue topical gentamicin and silver alginate to both sites. Continue periwound ketoconazole on the left. Continue offloading sandals. Follow-up in 1 week. Electronic Signature(s) Signed: 05/25/2023 6:11:41 PM By: Shawn Stall RN, BSN Signed: 05/26/2023 7:45:11 AM By: [name] MD FACS Previous Signature: 05/25/2023 10:30:21 AM Version By: [name] MD FACS Entered By: Shawn [name] on 05/25/2023 15:07:04 -------------------------------------------------------------------------------- HxROS Details Patient Name: Date of Service: [name], TO [name] 05/25/2023 10:15 A M Medical Record Number: [id number] Patient Account Number: [id number] Date of Birth/Sex: Treating RN: [birth year] (47 y.o. M) Primary Care Provider: Dorinda Hill Other Clinician: Referring Provider: Treating Provider/Extender: Jana Hakim in Treatment: 149 Information Obtained From Patient Constitutional Symptoms (General Health) Medical History: Past Medical History Notes: COVID PNA 07/22/2019-11/14/2019 VENT ECMO, foot drop left foot , Eyes Medical History: Negative for: Cataracts; Glaucoma; Optic Neuritis Ear/Nose/Mouth/Throat Medical History: Negative for: Chronic sinus problems/congestion; Middle ear problems Hematologic/Lymphatic Medical History: Negative for: Anemia; Hemophilia; Human Immunodeficiency Virus; Lymphedema; Sickle Cell Disease Respiratory Medical History: Positive for: Asthma Negative for: Aspiration; Chronic Obstructive Pulmonary Disease (COPD); Pneumothorax; Sleep Apnea; Tuberculosis Cardiovascular Medical History: Positive for: Angina - with COVID; Hypertension Negative for: Arrhythmia; Congestive Heart Failure; Coronary Artery Disease; Deep Vein Thrombosis; Hypotension; Myocardial Infarction; Peripheral Arterial Disease; Peripheral Venous Disease; Phlebitis;  Vasculitis Gastrointestinal Medical History: Negative for: Cirrhosis ; Colitis; Crohns; Hepatitis A; Hepatitis B; Hepatitis [name], [name] ([id number]) [id number] Page 18 of 19 Endocrine Medical History: Negative for: Type I Diabetes; Type II Diabetes Genitourinary Medical History: Negative for: End Stage Renal Disease Past Medical History Notes: kidney stone Immunological Medical History: Negative for: Lupus Erythematosus; Raynauds; Scleroderma Integumentary (Skin) Medical History: Negative for: History of Burn Musculoskeletal Medical History: Negative for: Gout; Rheumatoid Arthritis; Osteoarthritis; Osteomyelitis Neurologic Medical History: Negative for: Dementia; Neuropathy; Quadriplegia; Paraplegia; Seizure Disorder Oncologic Medical History: Negative for: Received Chemotherapy; Received Radiation Psychiatric Medical History: Negative for: Anorexia/bulimia; Confinement Anxiety Past Medical History Notes: anxiety Immunizations Pneumococcal Vaccine: Received Pneumococcal Vaccination: No Implantable Devices None Hospitalization / Surgery History Type of Hospitalization/Surgery COVID PNA 07/22/2019- 11/14/2019 03/27/2020 wound debridement/ skin graft Family and  Social History Cancer: Yes - Maternal Grandparents; Diabetes: Yes - Father,Paternal Grandparents; Heart Disease: Yes - Maternal Grandparents; Hereditary Spherocytosis: No; Hypertension: Yes - Father,Paternal Grandparents; Kidney Disease: No; Lung Disease: Yes - Siblings; Seizures: No; Stroke: No; Thyroid Problems: No; Tuberculosis: No; Never smoker; Marital Status - Married; Alcohol Use: Never; Drug Use: No History; Caffeine Use: Daily - tea, soda; Financial Concerns: No; Food, Clothing or Shelter Needs: No; Support System Lacking: No; Transportation Concerns: No Electronic Signature(s) Signed: 05/25/2023 11:04:27 AM By: [name] MD FACS Entered By: [name] on  05/25/2023 57:84:69 SuperBill Details -------------------------------------------------------------------------------- [name] ([id number]) [id number] Page 19 of 19 Patient Name: Date of Service: [name], TO [place] 05/25/2023 Medical Record Number: [id number] Patient Account Number: [id number] Date of Birth/Sex: Treating RN: 1975/10/18 (46 y.o. M) Primary Care Provider: Dorinda Hill Other Clinician: Referring Provider: Treating Provider/Extender: Jana Hakim in Treatment: 149 Diagnosis Coding ICD-10 Codes Code Description [phone] Non-pressure chronic ulcer of other part of right foot with fat layer exposed L97.522 Non-pressure chronic ulcer of other part of left foot with fat layer exposed Facility Procedures CPT4 Code Description Modifier Quantity [id number] [phone] - DEBRIDE WOUND 1ST 20 SQ CM OR < 1 ICD-10 Diagnosis Description L97.512 Non-pressure chronic ulcer of other part of right foot with fat layer exposed L97.522 Non-pressure chronic ulcer of other part of left foot with fat layer exposed Physician Procedures Quantity CPT4 Code Description Modifier [id number] - WC PHYS LEVEL 4 - EST PT 25 1 ICD-10 Diagnosis Description L97.512 Non-pressure chronic ulcer of other part of right foot with fat layer exposed L97.522 Non-pressure chronic ulcer of other part of left foot with fat layer exposed [id number] - WC PHYS DEBR WO ANESTH 20 SQ CM 1 ICD-10 Diagnosis Description L97.512 Non-pressure chronic ulcer of other part of right foot with fat layer exposed L97.522 Non-pressure chronic ulcer of other part of left foot with fat layer exposed Electronic Signature(s) Signed: 05/25/2023 10:30:40 AM By: [name] MD FACS Entered By: [name] on 05/25/2023 07:30:40  Treatment: 149 Active Problems ICD-10 Encounter Code Description Active Date MDM Diagnosis L97.512 Non-pressure chronic ulcer of other part of right foot with fat layer exposed 04/27/2023 No Yes L97.522 Non-pressure chronic ulcer of other part of left foot with fat layer exposed 09/03/2020 No Yes Inactive Problems ICD-10 Code Description Active Date Inactive Date L89.893 Pressure ulcer of other site, stage 3 07/15/2020 07/15/2020 M62.81 Muscle weakness (generalized) 07/15/2020 07/15/2020 [name]  ([id number]) [phone].pdf Page 10 of 19 I10 Essential (primary) hypertension 07/15/2020 07/15/2020 M86.171 Other acute osteomyelitis, right ankle and foot 09/03/2020 09/03/2020 Resolved Problems Electronic Signature(s) Signed: 05/25/2023 10:26:02 AM By: [name] MD FACS Entered By: [name] on 05/25/2023 07:26:02 -------------------------------------------------------------------------------- Progress Note Details Patient Name: Date of Service: [name], [name] 05/25/2023 10:15 A M Medical Record Number: [id number] Patient Account Number: [id number] Date of Birth/Sex: Treating RN: [birth year] (47 y.o. M) Primary Care Provider: Dorinda Hill Other Clinician: Referring Provider: Treating Provider/Extender: Jana Hakim in Treatment: 149 Subjective Chief Complaint Information obtained from Patient Bilateral Plantar Foot Ulcers History of Present Illness (HPI) Wounds are12/03/2020 upon evaluation today patient presents for initial inspection here in our clinic concerning issues he has been having with the bottoms of his feet bilaterally. He states these actually occurred as wounds when he was hospitalized for 5 months secondary to Covid. He was apparently with tilting bed where he was in an upright position quite frequently and apparently this occurred in some way shape or form during that time. Fortunately there is no sign of active infection at this time. No fevers, chills, nausea, vomiting, or diarrhea. With that being said he still has substantial wounds on the plantar aspects of his feet Theragen require quite a bit of work to get these to heal. He has been using Santyl currently though that is been problematic both in receiving the medication as well as actually paid for it as it is become quite expensive. Prior to the experience with Covid the patient really did not have any major medical problems other than hypertension he does  have some mild generalized weakness following the Covid experience. 07/22/2020 on evaluation today patient appears to be doing okay in regard to his foot ulcers I feel like the wound beds are showing signs of better improvement that I do believe the Iodoflex is helping in this regard. With that being said he does have a lot of drainage currently and this is somewhat blue/green in nature which is consistent with Pseudomonas. I do think a culture today would be appropriate for [place] to evaluate and see if that is indeed the case I would likely start him on antibiotic orally as well he is not allergic to Cipro knows of no issues he has had in the past 12/21; patient was admitted to the clinic earlier this month with bilateral presumed pressure ulcers on the bottom of his feet apparently related to excessive pressure from a tilt table arrangement in the intensive care unit. Patient relates this to being on ECMO but I am not really sure that is exactly related to that. I must say I have never seen anything like this. He has fairly extensive full-thickness wounds extending from his heel towards his midfoot mostly centered laterally. There is already been some healing distally. He does not appear to have an arterial issue. He has been using gentamicin to the wound surfaces with Iodoflex to help with ongoing debridement 1/6; this is a patient with pressure ulcers on the bottom of  surface and minimal eschar around the edges. 03/15/2023: The wound measured narrower today. There is some slough accumulation on the surface and some eschar around the edges. 03/23/2023: The length of the wound remains stable, but it continues to narrow. The skin that has covered the posterior aspect of the calcaneus is more robust and has better tensile integrity. Minimal slough and eschar accumulation. 03/30/2023: The wound length has decreased. This seems to be primarily due to more epithelium covering the posterior aspect of the site. Minimal slough and eschar buildup. 04/07/2023: The wound measured a little bit smaller again today. There is minimal slough accumulation with just a little bit of eschar and callus around the edges. 04/13/2023: The lateral aspect of his wound is starting to epithelialize. There is very minimal slough on the surface. Moisture control is good. 04/20/2023: Continued, albeit extremely slow, encroachment of  epithelium as of the wound surface. Minimal slough. Moisture balance is good. 04/27/2023: More epithelium is filling in from the heel area. There is a little bit of slough on the surface with some periwound callus and eschar. He did note a little bit of drainage coming from his right foot and there is a small crack in the skin extending medially from the main site of his prior ulceration. 05/04/2023: The wounds are stable this week. No significant change. There is a little bit of callus buildup around the edges of each site and some thin slough on the surface on the left foot. 05/11/2023: The skin on the right heel is filling in further. There is a little bit of slough accumulation on both surfaces along with periwound callus accumulation. 05/18/2023: Both wounds are smaller today. The right foot is down to just a sliver of opening. 05/25/2023: Epithelium continues to advance from the calcaneal area towards the midfoot on the left. On the right, there is tiniest crack of an opening that remains. Electronic Signature(s) Signed: 05/25/2023 10:28:40 AM By: [name] MD FACS Entered By: [name] on 05/25/2023 07:28:40 -------------------------------------------------------------------------------- Physical Exam Details Patient Name: Date of Service: [name], [name] 05/25/2023 10:15 A M Medical Record Number: [id number] Patient Account Number: [id number] Date of Birth/Sex: Treating RN: 08-09-1975 (47 y.o. M) Primary Care Provider: Dorinda Hill Other Clinician: Referring Provider: Treating Provider/Extender: Jana Hakim in Treatment: 149 Constitutional . . . . no acute distress. Respiratory Normal work of breathing on room air. Notes 05/25/2023: Epithelium continues to advance from the calcaneal area towards the midfoot on the left. On the right, there is tiniest crack of an opening that remains. Electronic Signature(s) Signed: 05/25/2023 10:29:12 AM By:  [name] MD FACS Entered By: [name] on 05/25/2023 07:29:12 [name] ([id number]) [id number] Page 8 of 19 -------------------------------------------------------------------------------- Physician Orders Details Patient Name: Date of Service: [name] [place] 05/25/2023 10:15 A M Medical Record Number: [id number] Patient Account Number: [id number] Date of Birth/Sex: Treating RN: [birth year] (47 y.o. [name] Primary Care Provider: Dorinda Hill Other Clinician: Referring Provider: Treating Provider/Extender: Jana Hakim in Treatment: 149 The following information was scribed by: Brenton Grills The information was scribed for: [name] Verbal / Phone Orders: No Diagnosis Coding ICD-10 Coding Code Description [phone] Non-pressure chronic ulcer of other part of right foot with fat layer exposed L97.522 Non-pressure chronic ulcer of other part of left foot with fat layer exposed Follow-up Appointments ppointment in 1 week. - Dr. Lady Gary Room 3 - 06/01/2023 at 10:15am Return A ppointment in 2 weeks. -  Woven Gauze Sponge, Non-Sterile 4x4 in (Generic) 1 x Per Day/Other:until healed Discharge Instructions: Apply over primary dressing as directed. Secured With: 65M Medipore H Soft Cloth Surgical T ape, 4 x 10 (in/yd) (Generic) 1 x Per Day/Other:until healed Discharge Instructions: Secure with tape as directed. Com pression Wrap: Kerlix Roll 4.5x3.1 (in/yd) (Generic) 1 x Per Day/Other:until healed Discharge Instructions: Apply Kerlix and Coban compression as directed. Add-Ons: [employer] Offloading Boot, Size Regular 1 x Per Day/Other:until healed WOUND #5: - Foot Wound Laterality: Plantar, Right Topical: Gentamicin 1 x Per Day/30 Days Discharge Instructions: As directed by physician Prim Dressing: Maxorb Extra Ag+ Alginate Dressing, 4x4.75 (in/in) 1 x Per Day/30 Days ary Discharge Instructions: Apply to wound bed as instructed Secondary Dressing: Drawtex 4x4 in 1 x Per Day/30 Days Discharge Instructions: Apply over primary dressing as directed. Secondary Dressing: Optifoam Non-Adhesive Dressing, 4x4 in 1 x Per Day/30 Days Discharge Instructions: Apply over primary dressing as directed. [name], [name] ([id number]) [id number] Page 17 of 19 Secured With: [employer], 4.5x3.1 (in/yd) 1 x Per Day/30 Days Discharge Instructions: Secure with Kerlix as directed. 05/25/2023: Epithelium continues to advance from the calcaneal area towards the midfoot on the left. On the right, there is tiniest crack of an opening that remains. I used a  curette to debride some callus on the right and some slough from the left. We will continue topical gentamicin and silver alginate to both sites. Continue periwound ketoconazole on the left. Continue offloading sandals. Follow-up in 1 week. Electronic Signature(s) Signed: 05/25/2023 6:11:41 PM By: Shawn Stall RN, BSN Signed: 05/26/2023 7:45:11 AM By: [name] MD FACS Previous Signature: 05/25/2023 10:30:21 AM Version By: [name] MD FACS Entered By: Shawn [name] on 05/25/2023 15:07:04 -------------------------------------------------------------------------------- HxROS Details Patient Name: Date of Service: [name], TO [name] 05/25/2023 10:15 A M Medical Record Number: [id number] Patient Account Number: [id number] Date of Birth/Sex: Treating RN: [birth year] (47 y.o. M) Primary Care Provider: Dorinda Hill Other Clinician: Referring Provider: Treating Provider/Extender: Jana Hakim in Treatment: 149 Information Obtained From Patient Constitutional Symptoms (General Health) Medical History: Past Medical History Notes: COVID PNA 07/22/2019-11/14/2019 [facility] ECMO, foot drop left foot , Eyes Medical History: Negative for: Cataracts; Glaucoma; Optic Neuritis Ear/Nose/Mouth/Throat Medical History: Negative for: Chronic sinus problems/congestion; Middle ear problems Hematologic/Lymphatic Medical History: Negative for: Anemia; Hemophilia; Human Immunodeficiency Virus; Lymphedema; Sickle Cell Disease Respiratory Medical History: Positive for: Asthma Negative for: Aspiration; Chronic Obstructive Pulmonary Disease (COPD); Pneumothorax; Sleep Apnea; Tuberculosis Cardiovascular Medical History: Positive for: Angina - with COVID; Hypertension Negative for: Arrhythmia; Congestive Heart Failure; Coronary Artery Disease; Deep Vein Thrombosis; Hypotension; Myocardial Infarction; Peripheral Arterial Disease; Peripheral Venous Disease; Phlebitis;  Vasculitis Gastrointestinal Medical History: Negative for: Cirrhosis ; Colitis; Crohns; Hepatitis A; Hepatitis B; Hepatitis [name], [name] ([id number]) [id number] Page 18 of 19 Endocrine Medical History: Negative for: Type I Diabetes; Type II Diabetes Genitourinary Medical History: Negative for: End Stage Renal Disease Past Medical History Notes: kidney stone Immunological Medical History: Negative for: Lupus Erythematosus; Raynauds; Scleroderma Integumentary (Skin) Medical History: Negative for: History of Burn Musculoskeletal Medical History: Negative for: Gout; Rheumatoid Arthritis; Osteoarthritis; Osteomyelitis Neurologic Medical History: Negative for: Dementia; Neuropathy; Quadriplegia; Paraplegia; Seizure Disorder Oncologic Medical History: Negative for: Received Chemotherapy; Received Radiation Psychiatric Medical History: Negative for: Anorexia/bulimia; Confinement Anxiety Past Medical History Notes: anxiety Immunizations Pneumococcal Vaccine: Received Pneumococcal Vaccination: No Implantable Devices None Hospitalization / Surgery History Type of Hospitalization/Surgery COVID PNA 07/22/2019- 11/14/2019 03/27/2020 wound debridement/ skin graft Family and  surface and minimal eschar around the edges. 03/15/2023: The wound measured narrower today. There is some slough accumulation on the surface and some eschar around the edges. 03/23/2023: The length of the wound remains stable, but it continues to narrow. The skin that has covered the posterior aspect of the calcaneus is more robust and has better tensile integrity. Minimal slough and eschar accumulation. 03/30/2023: The wound length has decreased. This seems to be primarily due to more epithelium covering the posterior aspect of the site. Minimal slough and eschar buildup. 04/07/2023: The wound measured a little bit smaller again today. There is minimal slough accumulation with just a little bit of eschar and callus around the edges. 04/13/2023: The lateral aspect of his wound is starting to epithelialize. There is very minimal slough on the surface. Moisture control is good. 04/20/2023: Continued, albeit extremely slow, encroachment of  epithelium as of the wound surface. Minimal slough. Moisture balance is good. 04/27/2023: More epithelium is filling in from the heel area. There is a little bit of slough on the surface with some periwound callus and eschar. He did note a little bit of drainage coming from his right foot and there is a small crack in the skin extending medially from the main site of his prior ulceration. 05/04/2023: The wounds are stable this week. No significant change. There is a little bit of callus buildup around the edges of each site and some thin slough on the surface on the left foot. 05/11/2023: The skin on the right heel is filling in further. There is a little bit of slough accumulation on both surfaces along with periwound callus accumulation. 05/18/2023: Both wounds are smaller today. The right foot is down to just a sliver of opening. 05/25/2023: Epithelium continues to advance from the calcaneal area towards the midfoot on the left. On the right, there is tiniest crack of an opening that remains. Electronic Signature(s) Signed: 05/25/2023 10:28:40 AM By: [name] MD FACS Entered By: [name] on 05/25/2023 07:28:40 -------------------------------------------------------------------------------- Physical Exam Details Patient Name: Date of Service: [name], [name] 05/25/2023 10:15 A M Medical Record Number: [id number] Patient Account Number: [id number] Date of Birth/Sex: Treating RN: 08-09-1975 (47 y.o. M) Primary Care Provider: Dorinda Hill Other Clinician: Referring Provider: Treating Provider/Extender: Jana Hakim in Treatment: 149 Constitutional . . . . no acute distress. Respiratory Normal work of breathing on room air. Notes 05/25/2023: Epithelium continues to advance from the calcaneal area towards the midfoot on the left. On the right, there is tiniest crack of an opening that remains. Electronic Signature(s) Signed: 05/25/2023 10:29:12 AM By:  [name] MD FACS Entered By: [name] on 05/25/2023 07:29:12 [name] ([id number]) [id number] Page 8 of 19 -------------------------------------------------------------------------------- Physician Orders Details Patient Name: Date of Service: [name] [place] 05/25/2023 10:15 A M Medical Record Number: [id number] Patient Account Number: [id number] Date of Birth/Sex: Treating RN: [birth year] (47 y.o. [name] Primary Care Provider: Dorinda Hill Other Clinician: Referring Provider: Treating Provider/Extender: Jana Hakim in Treatment: 149 The following information was scribed by: Brenton Grills The information was scribed for: [name] Verbal / Phone Orders: No Diagnosis Coding ICD-10 Coding Code Description [phone] Non-pressure chronic ulcer of other part of right foot with fat layer exposed L97.522 Non-pressure chronic ulcer of other part of left foot with fat layer exposed Follow-up Appointments ppointment in 1 week. - Dr. Lady Gary Room 3 - 06/01/2023 at 10:15am Return A ppointment in 2 weeks. -  [name], [name] ([id number]) [id number] Page 1 of 19 Visit Report for 05/25/2023 Chief Complaint Document Details Patient Name: Date of Service: [name], TO [name] 05/25/2023 10:15 A M Medical Record Number: [id number] Patient Account Number: [id number] Date of Birth/Sex: Treating RN: May 05, 1976 (47 y.o. M) Primary Care Provider: Dorinda Hill Other Clinician: Referring Provider: Treating Provider/Extender: Jana Hakim in Treatment: 149 Information Obtained from: Patient Chief Complaint Bilateral Plantar Foot Ulcers Electronic Signature(s) Signed: 05/25/2023 10:26:22 AM By: [name] MD FACS Entered By: [name] on 05/25/2023 07:26:21 -------------------------------------------------------------------------------- Debridement Details Patient Name: Date of Service: [name], [name] 05/25/2023 10:15 A M Medical Record Number: [id number] Patient Account Number: [id number] Date of Birth/Sex: Treating RN: [birth year] (47 y.o. [name] Primary Care Provider: Dorinda Hill Other Clinician: Referring Provider: Treating Provider/Extender: Jana Hakim in Treatment: 149 Debridement Performed for Assessment: Wound #2 Left Calcaneus Performed By: Physician [name], MD The following information was scribed by: Brenton Grills The information was scribed for: [name] Debridement Type: Debridement Level of Consciousness (Pre-procedure): Awake and Alert Pre-procedure Verification/Time Out Yes - 10:21 Taken: Start Time: 10:22 Pain Control: Lidocaine 4% T opical Solution Percent of Wound Bed Debrided: 100% T Area Debrided (cm): otal 1.65 Tissue and other material debrided: Non-Viable, Slough, Slough Level: Non-Viable Tissue Debridement Description: Selective/Open Wound Instrument: Curette Bleeding: Minimum Hemostasis Achieved: Pressure Response to Treatment: Procedure was  tolerated well Level of Consciousness (Post- Awake and Alert procedure): Post Debridement Measurements of Total Wound Length: (cm) 2.1 Stage: Category/Stage III Width: (cm) 1 Depth: (cm) 0.1 Volume: (cm) 0.165 Character of Wound/Ulcer Post Debridement: Improved Post Procedure Diagnosis [name], [name] ([id number]) [id number] Page 2 of 19 Same as Pre-procedure Electronic Signature(s) Signed: 05/25/2023 11:04:27 AM By: [name] MD FACS Signed: 05/26/2023 12:08:28 PM By: Brenton Grills Entered By: Brenton Grills on 05/25/2023 48:54:62 -------------------------------------------------------------------------------- Debridement Details Patient Name: Date of Service: [name], TO [name] 05/25/2023 10:15 A M Medical Record Number: [id number] Patient Account Number: [id number] Date of Birth/Sex: Treating RN: 05/23/1976 (47 y.o. [name] Primary Care Provider: Dorinda Hill Other Clinician: Referring Provider: Treating Provider/Extender: Jana Hakim in Treatment: 149 Debridement Performed for Assessment: Wound #5 Right,Plantar Foot Performed By: Physician [name], MD The following information was scribed by: Brenton Grills The information was scribed for: [name] Debridement Type: Debridement Level of Consciousness (Pre-procedure): Awake and Alert Pre-procedure Verification/Time Out Yes - 10:21 Taken: Start Time: 10:22 Pain Control: Lidocaine 4% Topical Solution Percent of Wound Bed Debrided: 100% T Area Debrided (cm): otal 0.08 Tissue and other material debrided: Non-Viable, Callus Level: Non-Viable Tissue Debridement Description: Selective/Open Wound Instrument: Curette Bleeding: Minimum Hemostasis Achieved: Pressure Response to Treatment: Procedure was tolerated well Level of Consciousness (Post- Awake and Alert procedure): Post Debridement Measurements of Total Wound Length: (cm) 1 Stage:  Category/Stage II Width: (cm) 0.1 Depth: (cm) 0.2 Volume: (cm) 0.016 Character of Wound/Ulcer Post Debridement: Improved Post Procedure Diagnosis Same as Pre-procedure Electronic Signature(s) Signed: 05/25/2023 11:04:27 AM By: [name] MD FACS Signed: 05/26/2023 12:08:28 PM By: Brenton Grills Entered By: Brenton Grills on 05/25/2023 07:24:15 -------------------------------------------------------------------------------- HPI Details Patient Name: Date of Service: [name], [name] 05/25/2023 10:15 A M Medical Record Number: [id number] Patient Account Number: [id number] Date of Birth/Sex: Treating RN: 07-31-1976 (47 y.o. M) Primary Care Provider: Dorinda Hill Other Clinician: Morton [name] ([id number]) [id number] Page 3 of 19 Referring Provider: Treating Provider/Extender: Jana Hakim in  [name], [name] ([id number]) [id number] Page 1 of 19 Visit Report for 05/25/2023 Chief Complaint Document Details Patient Name: Date of Service: [name], TO [name] 05/25/2023 10:15 A M Medical Record Number: [id number] Patient Account Number: [id number] Date of Birth/Sex: Treating RN: May 05, 1976 (47 y.o. M) Primary Care Provider: Dorinda Hill Other Clinician: Referring Provider: Treating Provider/Extender: Jana Hakim in Treatment: 149 Information Obtained from: Patient Chief Complaint Bilateral Plantar Foot Ulcers Electronic Signature(s) Signed: 05/25/2023 10:26:22 AM By: [name] MD FACS Entered By: [name] on 05/25/2023 07:26:21 -------------------------------------------------------------------------------- Debridement Details Patient Name: Date of Service: [name], [name] 05/25/2023 10:15 A M Medical Record Number: [id number] Patient Account Number: [id number] Date of Birth/Sex: Treating RN: [birth year] (47 y.o. [name] Primary Care Provider: Dorinda Hill Other Clinician: Referring Provider: Treating Provider/Extender: Jana Hakim in Treatment: 149 Debridement Performed for Assessment: Wound #2 Left Calcaneus Performed By: Physician [name], MD The following information was scribed by: Brenton Grills The information was scribed for: [name] Debridement Type: Debridement Level of Consciousness (Pre-procedure): Awake and Alert Pre-procedure Verification/Time Out Yes - 10:21 Taken: Start Time: 10:22 Pain Control: Lidocaine 4% T opical Solution Percent of Wound Bed Debrided: 100% T Area Debrided (cm): otal 1.65 Tissue and other material debrided: Non-Viable, Slough, Slough Level: Non-Viable Tissue Debridement Description: Selective/Open Wound Instrument: Curette Bleeding: Minimum Hemostasis Achieved: Pressure Response to Treatment: Procedure was  tolerated well Level of Consciousness (Post- Awake and Alert procedure): Post Debridement Measurements of Total Wound Length: (cm) 2.1 Stage: Category/Stage III Width: (cm) 1 Depth: (cm) 0.1 Volume: (cm) 0.165 Character of Wound/Ulcer Post Debridement: Improved Post Procedure Diagnosis [name], [name] ([id number]) [id number] Page 2 of 19 Same as Pre-procedure Electronic Signature(s) Signed: 05/25/2023 11:04:27 AM By: [name] MD FACS Signed: 05/26/2023 12:08:28 PM By: Brenton Grills Entered By: Brenton Grills on 05/25/2023 48:54:62 -------------------------------------------------------------------------------- Debridement Details Patient Name: Date of Service: [name], TO [name] 05/25/2023 10:15 A M Medical Record Number: [id number] Patient Account Number: [id number] Date of Birth/Sex: Treating RN: 05/23/1976 (47 y.o. [name] Primary Care Provider: Dorinda Hill Other Clinician: Referring Provider: Treating Provider/Extender: Jana Hakim in Treatment: 149 Debridement Performed for Assessment: Wound #5 Right,Plantar Foot Performed By: Physician [name], MD The following information was scribed by: Brenton Grills The information was scribed for: [name] Debridement Type: Debridement Level of Consciousness (Pre-procedure): Awake and Alert Pre-procedure Verification/Time Out Yes - 10:21 Taken: Start Time: 10:22 Pain Control: Lidocaine 4% Topical Solution Percent of Wound Bed Debrided: 100% T Area Debrided (cm): otal 0.08 Tissue and other material debrided: Non-Viable, Callus Level: Non-Viable Tissue Debridement Description: Selective/Open Wound Instrument: Curette Bleeding: Minimum Hemostasis Achieved: Pressure Response to Treatment: Procedure was tolerated well Level of Consciousness (Post- Awake and Alert procedure): Post Debridement Measurements of Total Wound Length: (cm) 1 Stage:  Category/Stage II Width: (cm) 0.1 Depth: (cm) 0.2 Volume: (cm) 0.016 Character of Wound/Ulcer Post Debridement: Improved Post Procedure Diagnosis Same as Pre-procedure Electronic Signature(s) Signed: 05/25/2023 11:04:27 AM By: [name] MD FACS Signed: 05/26/2023 12:08:28 PM By: Brenton Grills Entered By: Brenton Grills on 05/25/2023 07:24:15 -------------------------------------------------------------------------------- HPI Details Patient Name: Date of Service: [name], [name] 05/25/2023 10:15 A M Medical Record Number: [id number] Patient Account Number: [id number] Date of Birth/Sex: Treating RN: 07-31-1976 (47 y.o. M) Primary Care Provider: Dorinda Hill Other Clinician: Morton [name] ([id number]) [id number] Page 3 of 19 Referring Provider: Treating Provider/Extender: Jana Hakim in  Social History Cancer: Yes - Maternal Grandparents; Diabetes: Yes - Father,Paternal Grandparents; Heart Disease: Yes - Maternal Grandparents; Hereditary Spherocytosis: No; Hypertension: Yes - Father,Paternal Grandparents; Kidney Disease: No; Lung Disease: Yes - Siblings; Seizures: No; Stroke: No; Thyroid Problems: No; Tuberculosis: No; Never smoker; Marital Status - Married; Alcohol Use: Never; Drug Use: No History; Caffeine Use: Daily - tea, soda; Financial Concerns: No; Food, Clothing or Shelter Needs: No; Support System Lacking: No; Transportation Concerns: No Electronic Signature(s) Signed: 05/25/2023 11:04:27 AM By: [name] MD FACS Entered By: [name] on  05/25/2023 57:84:69 SuperBill Details -------------------------------------------------------------------------------- [name] ([id number]) [id number] Page 19 of 19 Patient Name: Date of Service: [name], TO [place] 05/25/2023 Medical Record Number: [id number] Patient Account Number: [id number] Date of Birth/Sex: Treating RN: 1975/10/18 (46 y.o. M) Primary Care Provider: Dorinda Hill Other Clinician: Referring Provider: Treating Provider/Extender: Jana Hakim in Treatment: 149 Diagnosis Coding ICD-10 Codes Code Description [phone] Non-pressure chronic ulcer of other part of right foot with fat layer exposed L97.522 Non-pressure chronic ulcer of other part of left foot with fat layer exposed Facility Procedures CPT4 Code Description Modifier Quantity [id number] [phone] - DEBRIDE WOUND 1ST 20 SQ CM OR < 1 ICD-10 Diagnosis Description L97.512 Non-pressure chronic ulcer of other part of right foot with fat layer exposed L97.522 Non-pressure chronic ulcer of other part of left foot with fat layer exposed Physician Procedures Quantity CPT4 Code Description Modifier [id number] - WC PHYS LEVEL 4 - EST PT 25 1 ICD-10 Diagnosis Description L97.512 Non-pressure chronic ulcer of other part of right foot with fat layer exposed L97.522 Non-pressure chronic ulcer of other part of left foot with fat layer exposed [id number] - WC PHYS DEBR WO ANESTH 20 SQ CM 1 ICD-10 Diagnosis Description L97.512 Non-pressure chronic ulcer of other part of right foot with fat layer exposed L97.522 Non-pressure chronic ulcer of other part of left foot with fat layer exposed Electronic Signature(s) Signed: 05/25/2023 10:30:40 AM By: [name] MD FACS Entered By: [name] on 05/25/2023 07:30:40  Woven Gauze Sponge, Non-Sterile 4x4 in (Generic) 1 x Per Day/Other:until healed Discharge Instructions: Apply over primary dressing as directed. Secured With: 65M Medipore H Soft Cloth Surgical T ape, 4 x 10 (in/yd) (Generic) 1 x Per Day/Other:until healed Discharge Instructions: Secure with tape as directed. Com pression Wrap: Kerlix Roll 4.5x3.1 (in/yd) (Generic) 1 x Per Day/Other:until healed Discharge Instructions: Apply Kerlix and Coban compression as directed. Add-Ons: [employer] Offloading Boot, Size Regular 1 x Per Day/Other:until healed WOUND #5: - Foot Wound Laterality: Plantar, Right Topical: Gentamicin 1 x Per Day/30 Days Discharge Instructions: As directed by physician Prim Dressing: Maxorb Extra Ag+ Alginate Dressing, 4x4.75 (in/in) 1 x Per Day/30 Days ary Discharge Instructions: Apply to wound bed as instructed Secondary Dressing: Drawtex 4x4 in 1 x Per Day/30 Days Discharge Instructions: Apply over primary dressing as directed. Secondary Dressing: Optifoam Non-Adhesive Dressing, 4x4 in 1 x Per Day/30 Days Discharge Instructions: Apply over primary dressing as directed. [name], [name] ([id number]) [id number] Page 17 of 19 Secured With: [employer], 4.5x3.1 (in/yd) 1 x Per Day/30 Days Discharge Instructions: Secure with Kerlix as directed. 05/25/2023: Epithelium continues to advance from the calcaneal area towards the midfoot on the left. On the right, there is tiniest crack of an opening that remains. I used a  curette to debride some callus on the right and some slough from the left. We will continue topical gentamicin and silver alginate to both sites. Continue periwound ketoconazole on the left. Continue offloading sandals. Follow-up in 1 week. Electronic Signature(s) Signed: 05/25/2023 6:11:41 PM By: Shawn Stall RN, BSN Signed: 05/26/2023 7:45:11 AM By: [name] MD FACS Previous Signature: 05/25/2023 10:30:21 AM Version By: [name] MD FACS Entered By: Shawn [name] on 05/25/2023 15:07:04 -------------------------------------------------------------------------------- HxROS Details Patient Name: Date of Service: [name], TO [name] 05/25/2023 10:15 A M Medical Record Number: [id number] Patient Account Number: [id number] Date of Birth/Sex: Treating RN: [birth year] (47 y.o. M) Primary Care Provider: Dorinda Hill Other Clinician: Referring Provider: Treating Provider/Extender: Jana Hakim in Treatment: 149 Information Obtained From Patient Constitutional Symptoms (General Health) Medical History: Past Medical History Notes: COVID PNA 07/22/2019-11/14/2019 VENT ECMO, foot drop left foot , Eyes Medical History: Negative for: Cataracts; Glaucoma; Optic Neuritis Ear/Nose/Mouth/Throat Medical History: Negative for: Chronic sinus problems/congestion; Middle ear problems Hematologic/Lymphatic Medical History: Negative for: Anemia; Hemophilia; Human Immunodeficiency Virus; Lymphedema; Sickle Cell Disease Respiratory Medical History: Positive for: Asthma Negative for: Aspiration; Chronic Obstructive Pulmonary Disease (COPD); Pneumothorax; Sleep Apnea; Tuberculosis Cardiovascular Medical History: Positive for: Angina - with COVID; Hypertension Negative for: Arrhythmia; Congestive Heart Failure; Coronary Artery Disease; Deep Vein Thrombosis; Hypotension; Myocardial Infarction; Peripheral Arterial Disease; Peripheral Venous Disease; Phlebitis;  Vasculitis Gastrointestinal Medical History: Negative for: Cirrhosis ; Colitis; Crohns; Hepatitis A; Hepatitis B; Hepatitis [name], [name] ([id number]) [id number] Page 18 of 19 Endocrine Medical History: Negative for: Type I Diabetes; Type II Diabetes Genitourinary Medical History: Negative for: End Stage Renal Disease Past Medical History Notes: kidney stone Immunological Medical History: Negative for: Lupus Erythematosus; Raynauds; Scleroderma Integumentary (Skin) Medical History: Negative for: History of Burn Musculoskeletal Medical History: Negative for: Gout; Rheumatoid Arthritis; Osteoarthritis; Osteomyelitis Neurologic Medical History: Negative for: Dementia; Neuropathy; Quadriplegia; Paraplegia; Seizure Disorder Oncologic Medical History: Negative for: Received Chemotherapy; Received Radiation Psychiatric Medical History: Negative for: Anorexia/bulimia; Confinement Anxiety Past Medical History Notes: anxiety Immunizations Pneumococcal Vaccine: Received Pneumococcal Vaccination: No Implantable Devices None Hospitalization / Surgery History Type of Hospitalization/Surgery COVID PNA 07/22/2019- 11/14/2019 03/27/2020 wound debridement/ skin graft Family and  Treatment: 149 Active Problems ICD-10 Encounter Code Description Active Date MDM Diagnosis L97.512 Non-pressure chronic ulcer of other part of right foot with fat layer exposed 04/27/2023 No Yes L97.522 Non-pressure chronic ulcer of other part of left foot with fat layer exposed 09/03/2020 No Yes Inactive Problems ICD-10 Code Description Active Date Inactive Date L89.893 Pressure ulcer of other site, stage 3 07/15/2020 07/15/2020 M62.81 Muscle weakness (generalized) 07/15/2020 07/15/2020 [name]  ([id number]) [phone].pdf Page 10 of 19 I10 Essential (primary) hypertension 07/15/2020 07/15/2020 M86.171 Other acute osteomyelitis, right ankle and foot 09/03/2020 09/03/2020 Resolved Problems Electronic Signature(s) Signed: 05/25/2023 10:26:02 AM By: [name] MD FACS Entered By: [name] on 05/25/2023 07:26:02 -------------------------------------------------------------------------------- Progress Note Details Patient Name: Date of Service: [name], [name] 05/25/2023 10:15 A M Medical Record Number: [id number] Patient Account Number: [id number] Date of Birth/Sex: Treating RN: [birth year] (47 y.o. M) Primary Care Provider: Dorinda Hill Other Clinician: Referring Provider: Treating Provider/Extender: Jana Hakim in Treatment: 149 Subjective Chief Complaint Information obtained from Patient Bilateral Plantar Foot Ulcers History of Present Illness (HPI) Wounds are12/03/2020 upon evaluation today patient presents for initial inspection here in our clinic concerning issues he has been having with the bottoms of his feet bilaterally. He states these actually occurred as wounds when he was hospitalized for 5 months secondary to Covid. He was apparently with tilting bed where he was in an upright position quite frequently and apparently this occurred in some way shape or form during that time. Fortunately there is no sign of active infection at this time. No fevers, chills, nausea, vomiting, or diarrhea. With that being said he still has substantial wounds on the plantar aspects of his feet Theragen require quite a bit of work to get these to heal. He has been using Santyl currently though that is been problematic both in receiving the medication as well as actually paid for it as it is become quite expensive. Prior to the experience with Covid the patient really did not have any major medical problems other than hypertension he does  have some mild generalized weakness following the Covid experience. 07/22/2020 on evaluation today patient appears to be doing okay in regard to his foot ulcers I feel like the wound beds are showing signs of better improvement that I do believe the Iodoflex is helping in this regard. With that being said he does have a lot of drainage currently and this is somewhat blue/green in nature which is consistent with Pseudomonas. I do think a culture today would be appropriate for [place] to evaluate and see if that is indeed the case I would likely start him on antibiotic orally as well he is not allergic to Cipro knows of no issues he has had in the past 12/21; patient was admitted to the clinic earlier this month with bilateral presumed pressure ulcers on the bottom of his feet apparently related to excessive pressure from a tilt table arrangement in the intensive care unit. Patient relates this to being on ECMO but I am not really sure that is exactly related to that. I must say I have never seen anything like this. He has fairly extensive full-thickness wounds extending from his heel towards his midfoot mostly centered laterally. There is already been some healing distally. He does not appear to have an arterial issue. He has been using gentamicin to the wound surfaces with Iodoflex to help with ongoing debridement 1/6; this is a patient with pressure ulcers on the bottom of  Woven Gauze Sponge, Non-Sterile 4x4 in (Generic) 1 x Per Day/Other:until healed Discharge Instructions: Apply over primary dressing as directed. Secured With: 65M Medipore H Soft Cloth Surgical T ape, 4 x 10 (in/yd) (Generic) 1 x Per Day/Other:until healed Discharge Instructions: Secure with tape as directed. Com pression Wrap: Kerlix Roll 4.5x3.1 (in/yd) (Generic) 1 x Per Day/Other:until healed Discharge Instructions: Apply Kerlix and Coban compression as directed. Add-Ons: [employer] Offloading Boot, Size Regular 1 x Per Day/Other:until healed WOUND #5: - Foot Wound Laterality: Plantar, Right Topical: Gentamicin 1 x Per Day/30 Days Discharge Instructions: As directed by physician Prim Dressing: Maxorb Extra Ag+ Alginate Dressing, 4x4.75 (in/in) 1 x Per Day/30 Days ary Discharge Instructions: Apply to wound bed as instructed Secondary Dressing: Drawtex 4x4 in 1 x Per Day/30 Days Discharge Instructions: Apply over primary dressing as directed. Secondary Dressing: Optifoam Non-Adhesive Dressing, 4x4 in 1 x Per Day/30 Days Discharge Instructions: Apply over primary dressing as directed. [name], [name] ([id number]) [id number] Page 17 of 19 Secured With: [employer], 4.5x3.1 (in/yd) 1 x Per Day/30 Days Discharge Instructions: Secure with Kerlix as directed. 05/25/2023: Epithelium continues to advance from the calcaneal area towards the midfoot on the left. On the right, there is tiniest crack of an opening that remains. I used a  curette to debride some callus on the right and some slough from the left. We will continue topical gentamicin and silver alginate to both sites. Continue periwound ketoconazole on the left. Continue offloading sandals. Follow-up in 1 week. Electronic Signature(s) Signed: 05/25/2023 6:11:41 PM By: Shawn Stall RN, BSN Signed: 05/26/2023 7:45:11 AM By: [name] MD FACS Previous Signature: 05/25/2023 10:30:21 AM Version By: [name] MD FACS Entered By: Shawn [name] on 05/25/2023 15:07:04 -------------------------------------------------------------------------------- HxROS Details Patient Name: Date of Service: [name], TO [name] 05/25/2023 10:15 A M Medical Record Number: [id number] Patient Account Number: [id number] Date of Birth/Sex: Treating RN: [birth year] (47 y.o. M) Primary Care Provider: Dorinda Hill Other Clinician: Referring Provider: Treating Provider/Extender: Jana Hakim in Treatment: 149 Information Obtained From Patient Constitutional Symptoms (General Health) Medical History: Past Medical History Notes: COVID PNA 07/22/2019-11/14/2019 VENT ECMO, foot drop left foot , Eyes Medical History: Negative for: Cataracts; Glaucoma; Optic Neuritis Ear/Nose/Mouth/Throat Medical History: Negative for: Chronic sinus problems/congestion; Middle ear problems Hematologic/Lymphatic Medical History: Negative for: Anemia; Hemophilia; Human Immunodeficiency Virus; Lymphedema; Sickle Cell Disease Respiratory Medical History: Positive for: Asthma Negative for: Aspiration; Chronic Obstructive Pulmonary Disease (COPD); Pneumothorax; Sleep Apnea; Tuberculosis Cardiovascular Medical History: Positive for: Angina - with COVID; Hypertension Negative for: Arrhythmia; Congestive Heart Failure; Coronary Artery Disease; Deep Vein Thrombosis; Hypotension; Myocardial Infarction; Peripheral Arterial Disease; Peripheral Venous Disease; Phlebitis;  Vasculitis Gastrointestinal Medical History: Negative for: Cirrhosis ; Colitis; Crohns; Hepatitis A; Hepatitis B; Hepatitis [name], [name] ([id number]) [id number] Page 18 of 19 Endocrine Medical History: Negative for: Type I Diabetes; Type II Diabetes Genitourinary Medical History: Negative for: End Stage Renal Disease Past Medical History Notes: kidney stone Immunological Medical History: Negative for: Lupus Erythematosus; Raynauds; Scleroderma Integumentary (Skin) Medical History: Negative for: History of Burn Musculoskeletal Medical History: Negative for: Gout; Rheumatoid Arthritis; Osteoarthritis; Osteomyelitis Neurologic Medical History: Negative for: Dementia; Neuropathy; Quadriplegia; Paraplegia; Seizure Disorder Oncologic Medical History: Negative for: Received Chemotherapy; Received Radiation Psychiatric Medical History: Negative for: Anorexia/bulimia; Confinement Anxiety Past Medical History Notes: anxiety Immunizations Pneumococcal Vaccine: Received Pneumococcal Vaccination: No Implantable Devices None Hospitalization / Surgery History Type of Hospitalization/Surgery COVID PNA 07/22/2019- 11/14/2019 03/27/2020 wound debridement/ skin graft Family and  Social History Cancer: Yes - Maternal Grandparents; Diabetes: Yes - Father,Paternal Grandparents; Heart Disease: Yes - Maternal Grandparents; Hereditary Spherocytosis: No; Hypertension: Yes - Father,Paternal Grandparents; Kidney Disease: No; Lung Disease: Yes - Siblings; Seizures: No; Stroke: No; Thyroid Problems: No; Tuberculosis: No; Never smoker; Marital Status - Married; Alcohol Use: Never; Drug Use: No History; Caffeine Use: Daily - tea, soda; Financial Concerns: No; Food, Clothing or Shelter Needs: No; Support System Lacking: No; Transportation Concerns: No Electronic Signature(s) Signed: 05/25/2023 11:04:27 AM By: [name] MD FACS Entered By: [name] on  05/25/2023 57:84:69 SuperBill Details -------------------------------------------------------------------------------- [name] ([id number]) [id number] Page 19 of 19 Patient Name: Date of Service: [name], TO [place] 05/25/2023 Medical Record Number: [id number] Patient Account Number: [id number] Date of Birth/Sex: Treating RN: 1975/10/18 (46 y.o. M) Primary Care Provider: Dorinda Hill Other Clinician: Referring Provider: Treating Provider/Extender: Jana Hakim in Treatment: 149 Diagnosis Coding ICD-10 Codes Code Description [phone] Non-pressure chronic ulcer of other part of right foot with fat layer exposed L97.522 Non-pressure chronic ulcer of other part of left foot with fat layer exposed Facility Procedures CPT4 Code Description Modifier Quantity [id number] [phone] - DEBRIDE WOUND 1ST 20 SQ CM OR < 1 ICD-10 Diagnosis Description L97.512 Non-pressure chronic ulcer of other part of right foot with fat layer exposed L97.522 Non-pressure chronic ulcer of other part of left foot with fat layer exposed Physician Procedures Quantity CPT4 Code Description Modifier [id number] - WC PHYS LEVEL 4 - EST PT 25 1 ICD-10 Diagnosis Description L97.512 Non-pressure chronic ulcer of other part of right foot with fat layer exposed L97.522 Non-pressure chronic ulcer of other part of left foot with fat layer exposed [id number] [phone] - WC PHYS DEBR WO ANESTH 20 SQ CM 1 ICD-10 Diagnosis Description L97.512 Non-pressure chronic ulcer of other part of right foot with fat layer exposed L97.522 Non-pressure chronic ulcer of other part of left foot with fat layer exposed Electronic Signature(s) Signed: 05/25/2023 10:30:40 AM By: [name] MD FACS Entered By: [name] on 05/25/2023 07:30:40  Woven Gauze Sponge, Non-Sterile 4x4 in (Generic) 1 x Per Day/Other:until healed Discharge Instructions: Apply over primary dressing as directed. Secured With: 65M Medipore H Soft Cloth Surgical T ape, 4 x 10 (in/yd) (Generic) 1 x Per Day/Other:until healed Discharge Instructions: Secure with tape as directed. Com pression Wrap: Kerlix Roll 4.5x3.1 (in/yd) (Generic) 1 x Per Day/Other:until healed Discharge Instructions: Apply Kerlix and Coban compression as directed. Add-Ons: [employer] Offloading Boot, Size Regular 1 x Per Day/Other:until healed WOUND #5: - Foot Wound Laterality: Plantar, Right Topical: Gentamicin 1 x Per Day/30 Days Discharge Instructions: As directed by physician Prim Dressing: Maxorb Extra Ag+ Alginate Dressing, 4x4.75 (in/in) 1 x Per Day/30 Days ary Discharge Instructions: Apply to wound bed as instructed Secondary Dressing: Drawtex 4x4 in 1 x Per Day/30 Days Discharge Instructions: Apply over primary dressing as directed. Secondary Dressing: Optifoam Non-Adhesive Dressing, 4x4 in 1 x Per Day/30 Days Discharge Instructions: Apply over primary dressing as directed. [name], [name] ([id number]) [id number] Page 17 of 19 Secured With: [employer], 4.5x3.1 (in/yd) 1 x Per Day/30 Days Discharge Instructions: Secure with Kerlix as directed. 05/25/2023: Epithelium continues to advance from the calcaneal area towards the midfoot on the left. On the right, there is tiniest crack of an opening that remains. I used a  curette to debride some callus on the right and some slough from the left. We will continue topical gentamicin and silver alginate to both sites. Continue periwound ketoconazole on the left. Continue offloading sandals. Follow-up in 1 week. Electronic Signature(s) Signed: 05/25/2023 6:11:41 PM By: Shawn Stall RN, BSN Signed: 05/26/2023 7:45:11 AM By: [name] MD FACS Previous Signature: 05/25/2023 10:30:21 AM Version By: [name] MD FACS Entered By: Shawn [name] on 05/25/2023 15:07:04 -------------------------------------------------------------------------------- HxROS Details Patient Name: Date of Service: [name], TO [name] 05/25/2023 10:15 A M Medical Record Number: [id number] Patient Account Number: [id number] Date of Birth/Sex: Treating RN: [birth year] (47 y.o. M) Primary Care Provider: Dorinda Hill Other Clinician: Referring Provider: Treating Provider/Extender: Jana Hakim in Treatment: 149 Information Obtained From Patient Constitutional Symptoms (General Health) Medical History: Past Medical History Notes: COVID PNA 07/22/2019-11/14/2019 VENT ECMO, foot drop left foot , Eyes Medical History: Negative for: Cataracts; Glaucoma; Optic Neuritis Ear/Nose/Mouth/Throat Medical History: Negative for: Chronic sinus problems/congestion; Middle ear problems Hematologic/Lymphatic Medical History: Negative for: Anemia; Hemophilia; Human Immunodeficiency Virus; Lymphedema; Sickle Cell Disease Respiratory Medical History: Positive for: Asthma Negative for: Aspiration; Chronic Obstructive Pulmonary Disease (COPD); Pneumothorax; Sleep Apnea; Tuberculosis Cardiovascular Medical History: Positive for: Angina - with COVID; Hypertension Negative for: Arrhythmia; Congestive Heart Failure; Coronary Artery Disease; Deep Vein Thrombosis; Hypotension; Myocardial Infarction; Peripheral Arterial Disease; Peripheral Venous Disease; Phlebitis;  Vasculitis Gastrointestinal Medical History: Negative for: Cirrhosis ; Colitis; Crohns; Hepatitis A; Hepatitis B; Hepatitis [name], [name] ([id number]) [id number] Page 18 of 19 Endocrine Medical History: Negative for: Type I Diabetes; Type II Diabetes Genitourinary Medical History: Negative for: End Stage Renal Disease Past Medical History Notes: kidney stone Immunological Medical History: Negative for: Lupus Erythematosus; Raynauds; Scleroderma Integumentary (Skin) Medical History: Negative for: History of Burn Musculoskeletal Medical History: Negative for: Gout; Rheumatoid Arthritis; Osteoarthritis; Osteomyelitis Neurologic Medical History: Negative for: Dementia; Neuropathy; Quadriplegia; Paraplegia; Seizure Disorder Oncologic Medical History: Negative for: Received Chemotherapy; Received Radiation Psychiatric Medical History: Negative for: Anorexia/bulimia; Confinement Anxiety Past Medical History Notes: anxiety Immunizations Pneumococcal Vaccine: Received Pneumococcal Vaccination: No Implantable Devices None Hospitalization / Surgery History Type of Hospitalization/Surgery COVID PNA 07/22/2019- 11/14/2019 03/27/2020 wound debridement/ skin graft Family and  surface and minimal eschar around the edges. 03/15/2023: The wound measured narrower today. There is some slough accumulation on the surface and some eschar around the edges. 03/23/2023: The length of the wound remains stable, but it continues to narrow. The skin that has covered the posterior aspect of the calcaneus is more robust and has better tensile integrity. Minimal slough and eschar accumulation. 03/30/2023: The wound length has decreased. This seems to be primarily due to more epithelium covering the posterior aspect of the site. Minimal slough and eschar buildup. 04/07/2023: The wound measured a little bit smaller again today. There is minimal slough accumulation with just a little bit of eschar and callus around the edges. 04/13/2023: The lateral aspect of his wound is starting to epithelialize. There is very minimal slough on the surface. Moisture control is good. 04/20/2023: Continued, albeit extremely slow, encroachment of  epithelium as of the wound surface. Minimal slough. Moisture balance is good. 04/27/2023: More epithelium is filling in from the heel area. There is a little bit of slough on the surface with some periwound callus and eschar. He did note a little bit of drainage coming from his right foot and there is a small crack in the skin extending medially from the main site of his prior ulceration. 05/04/2023: The wounds are stable this week. No significant change. There is a little bit of callus buildup around the edges of each site and some thin slough on the surface on the left foot. 05/11/2023: The skin on the right heel is filling in further. There is a little bit of slough accumulation on both surfaces along with periwound callus accumulation. 05/18/2023: Both wounds are smaller today. The right foot is down to just a sliver of opening. 05/25/2023: Epithelium continues to advance from the calcaneal area towards the midfoot on the left. On the right, there is tiniest crack of an opening that remains. Electronic Signature(s) Signed: 05/25/2023 10:28:40 AM By: [name] MD FACS Entered By: [name] on 05/25/2023 07:28:40 -------------------------------------------------------------------------------- Physical Exam Details Patient Name: Date of Service: [name], [name] 05/25/2023 10:15 A M Medical Record Number: [id number] Patient Account Number: [id number] Date of Birth/Sex: Treating RN: 08-09-1975 (47 y.o. M) Primary Care Provider: Dorinda Hill Other Clinician: Referring Provider: Treating Provider/Extender: Jana Hakim in Treatment: 149 Constitutional . . . . no acute distress. Respiratory Normal work of breathing on room air. Notes 05/25/2023: Epithelium continues to advance from the calcaneal area towards the midfoot on the left. On the right, there is tiniest crack of an opening that remains. Electronic Signature(s) Signed: 05/25/2023 10:29:12 AM By:  [name] MD FACS Entered By: [name] on 05/25/2023 07:29:12 [name] ([id number]) [id number] Page 8 of 19 -------------------------------------------------------------------------------- Physician Orders Details Patient Name: Date of Service: [name] [place] 05/25/2023 10:15 A M Medical Record Number: [id number] Patient Account Number: [id number] Date of Birth/Sex: Treating RN: [birth year] (47 y.o. [name] Primary Care Provider: Dorinda Hill Other Clinician: Referring Provider: Treating Provider/Extender: Jana Hakim in Treatment: 149 The following information was scribed by: Brenton Grills The information was scribed for: [name] Verbal / Phone Orders: No Diagnosis Coding ICD-10 Coding Code Description [phone] Non-pressure chronic ulcer of other part of right foot with fat layer exposed L97.522 Non-pressure chronic ulcer of other part of left foot with fat layer exposed Follow-up Appointments ppointment in 1 week. - Dr. Lady Gary Room 3 - 06/01/2023 at 10:15am Return A ppointment in 2 weeks. -  Social History Cancer: Yes - Maternal Grandparents; Diabetes: Yes - Father,Paternal Grandparents; Heart Disease: Yes - Maternal Grandparents; Hereditary Spherocytosis: No; Hypertension: Yes - Father,Paternal Grandparents; Kidney Disease: No; Lung Disease: Yes - Siblings; Seizures: No; Stroke: No; Thyroid Problems: No; Tuberculosis: No; Never smoker; Marital Status - Married; Alcohol Use: Never; Drug Use: No History; Caffeine Use: Daily - tea, soda; Financial Concerns: No; Food, Clothing or Shelter Needs: No; Support System Lacking: No; Transportation Concerns: No Electronic Signature(s) Signed: 05/25/2023 11:04:27 AM By: [name] MD FACS Entered By: [name] on  05/25/2023 57:84:69 SuperBill Details -------------------------------------------------------------------------------- [name] ([id number]) [id number] Page 19 of 19 Patient Name: Date of Service: [name], TO [place] 05/25/2023 Medical Record Number: [id number] Patient Account Number: [id number] Date of Birth/Sex: Treating RN: 1975/10/18 (46 y.o. M) Primary Care Provider: Dorinda Hill Other Clinician: Referring Provider: Treating Provider/Extender: Jana Hakim in Treatment: 149 Diagnosis Coding ICD-10 Codes Code Description [phone] Non-pressure chronic ulcer of other part of right foot with fat layer exposed L97.522 Non-pressure chronic ulcer of other part of left foot with fat layer exposed Facility Procedures CPT4 Code Description Modifier Quantity [id number] [phone] - DEBRIDE WOUND 1ST 20 SQ CM OR < 1 ICD-10 Diagnosis Description L97.512 Non-pressure chronic ulcer of other part of right foot with fat layer exposed L97.522 Non-pressure chronic ulcer of other part of left foot with fat layer exposed Physician Procedures Quantity CPT4 Code Description Modifier [phone] [phone] - WC PHYS LEVEL 4 - EST PT 25 1 ICD-10 Diagnosis Description L97.512 Non-pressure chronic ulcer of other part of right foot with fat layer exposed L97.522 Non-pressure chronic ulcer of other part of left foot with fat layer exposed [phone] - WC PHYS DEBR WO ANESTH 20 SQ CM 1 ICD-10 Diagnosis Description L97.512 Non-pressure chronic ulcer of other part of right foot with fat layer exposed L97.522 Non-pressure chronic ulcer of other part of left foot with fat layer exposed Electronic Signature(s) Signed: 05/25/2023 10:30:40 AM By: [name] MD FACS Entered By: [name] on 05/25/2023 07:30:40  Social History Cancer: Yes - Maternal Grandparents; Diabetes: Yes - Father,Paternal Grandparents; Heart Disease: Yes - Maternal Grandparents; Hereditary Spherocytosis: No; Hypertension: Yes - Father,Paternal Grandparents; Kidney Disease: No; Lung Disease: Yes - Siblings; Seizures: No; Stroke: No; Thyroid Problems: No; Tuberculosis: No; Never smoker; Marital Status - Married; Alcohol Use: Never; Drug Use: No History; Caffeine Use: Daily - tea, soda; Financial Concerns: No; Food, Clothing or Shelter Needs: No; Support System Lacking: No; Transportation Concerns: No Electronic Signature(s) Signed: 05/25/2023 11:04:27 AM By: [name] MD FACS Entered By: [name] on  05/25/2023 57:84:69 SuperBill Details -------------------------------------------------------------------------------- [name] ([id number]) [id number] Page 19 of 19 Patient Name: Date of Service: [name], TO [place] 05/25/2023 Medical Record Number: [id number] Patient Account Number: [id number] Date of Birth/Sex: Treating RN: 1975/10/18 (46 y.o. M) Primary Care Provider: Dorinda Hill Other Clinician: Referring Provider: Treating Provider/Extender: Jana Hakim in Treatment: 149 Diagnosis Coding ICD-10 Codes Code Description [phone] Non-pressure chronic ulcer of other part of right foot with fat layer exposed L97.522 Non-pressure chronic ulcer of other part of left foot with fat layer exposed Facility Procedures CPT4 Code Description Modifier Quantity [phone] - DEBRIDE WOUND 1ST 20 SQ CM OR < 1 ICD-10 Diagnosis Description L97.512 Non-pressure chronic ulcer of other part of right foot with fat layer exposed L97.522 Non-pressure chronic ulcer of other part of left foot with fat layer exposed Physician Procedures Quantity CPT4 Code Description Modifier [phone] [id number] - WC PHYS LEVEL 4 - EST PT 25 1 ICD-10 Diagnosis Description L97.512 Non-pressure chronic ulcer of other part of right foot with fat layer exposed L97.522 Non-pressure chronic ulcer of other part of left foot with fat layer exposed [id number] - WC PHYS DEBR WO ANESTH 20 SQ CM 1 ICD-10 Diagnosis Description L97.512 Non-pressure chronic ulcer of other part of right foot with fat layer exposed L97.522 Non-pressure chronic ulcer of other part of left foot with fat layer exposed Electronic Signature(s) Signed: 05/25/2023 10:30:40 AM By: [name] MD FACS Entered By: [name] on 05/25/2023 07:30:40  Please make appt. Return A Anesthetic Wound #2 Left Calcaneus (In clinic) Topical Lidocaine 4% applied to wound bed - In clinic Bathing/ Shower/ Hygiene May shower and wash wound with soap and water. Edema Control - Lymphedema / SCD / Other Bilateral Lower Extremities Avoid standing for long periods of time. Moisturize legs daily. - as needed Off-Loading Other: - keep pressure off of the bottom of your feet. Elevate legs throughout the day. Use the Shoe with the PegAssist off-loading insole Additional Orders / Instructions Follow Nutritious Diet - Try to get 70-100 grams of Protein a day+ Coupons for Juven sent with patient. Other: - 500 mg per day to start, then increase to 500mg  twice a day. Also consider adding a zinc supplement. Juven Shake 1-2 times daily. Wound Treatment Wound #2 -  Calcaneus Wound Laterality: Left Cleanser: Normal Saline (Generic) 1 x Per Day/Other:until healed Discharge Instructions: Cleanse the wound with Normal Saline prior to applying a clean dressing using gauze sponges, not tissue or cotton balls. Cleanser: Wound Cleanser (Generic) 1 x Per Day/Other:until healed Discharge Instructions: Cleanse the wound with wound cleanser prior to applying a clean dressing using gauze sponges, not tissue or cotton balls. Peri-Wound Care: Ketoconazole Cream 2% (Generic) 1 x Per Day/Other:until healed Discharge Instructions: Apply Ketoconazole mixed with zinc to the periwound Peri-Wound Care: Zinc Oxide Ointment 30g tube (Generic) 1 x Per Day/Other:until healed Discharge Instructions: Apply Zinc Oxide to periwound with each dressing change Topical: Gentamicin (Generic) 1 x Per Day/Other:until healed Discharge Instructions: apply to the wound bed Prim Dressing: Maxorb Extra Ag+ Alginate Dressing, 4x4.75 (in/in) (Generic) 1 x Per Day/Other:until healed ary Discharge Instructions: Apply to wound bed as instructed Secondary Dressing: Drawtex 4x4 in (Generic) 1 x Per Day/Other:until healed Discharge Instructions: Apply over primary dressing as directed. Secondary Dressing: Optifoam Non-Adhesive Dressing, 4x4 in (Generic) 1 x Per Day/Other:until healed Discharge Instructions: Apply over primary dressing as directed. [name], [name] ([id number]) [id number] Page 9 of 19 Secondary Dressing: Woven Gauze Sponge, Non-Sterile 4x4 in (Generic) 1 x Per Day/Other:until healed Discharge Instructions: Apply over primary dressing as directed. Secured With: 7M Medipore H Soft Cloth Surgical T ape, 4 x 10 (in/yd) (Generic) 1 x Per Day/Other:until healed Discharge Instructions: Secure with tape as directed. Compression Wrap: Kerlix Roll 4.5x3.1 (in/yd) (Generic) 1 x Per Day/Other:until healed Discharge Instructions: Apply Kerlix and Coban compression as  directed. Add-Ons: [employer] Offloading Boot, Size Regular 1 x Per Day/Other:until healed Wound #5 - Foot Wound Laterality: Plantar, Right Topical: Gentamicin 1 x Per Day/30 Days Discharge Instructions: As directed by physician Prim Dressing: Maxorb Extra Ag+ Alginate Dressing, 4x4.75 (in/in) 1 x Per Day/30 Days ary Discharge Instructions: Apply to wound bed as instructed Secondary Dressing: Drawtex 4x4 in 1 x Per Day/30 Days Discharge Instructions: Apply over primary dressing as directed. Secondary Dressing: Optifoam Non-Adhesive Dressing, 4x4 in 1 x Per Day/30 Days Discharge Instructions: Apply over primary dressing as directed. Secured With: [employer], 4.5x3.1 (in/yd) 1 x Per Day/30 Days Discharge Instructions: Secure with Kerlix as directed. Electronic Signature(s) Signed: 05/25/2023 11:04:27 AM By: [name] MD FACS Signed: 05/26/2023 12:08:28 PM By: Brenton Grills Entered By: Brenton Grills on 05/25/2023 07:56:01 -------------------------------------------------------------------------------- Problem List Details Patient Name: Date of Service: [name], TO [name] 05/25/2023 10:15 A M Medical Record Number: [id number] Patient Account Number: [id number] Date of Birth/Sex: Treating RN: 1976-01-07 (47 y.o. [name] Primary Care Provider: Dorinda Hill Other Clinician: Referring Provider: Treating Provider/Extender: Jana Hakim in  Treatment: 149 Active Problems ICD-10 Encounter Code Description Active Date MDM Diagnosis L97.512 Non-pressure chronic ulcer of other part of right foot with fat layer exposed 04/27/2023 No Yes L97.522 Non-pressure chronic ulcer of other part of left foot with fat layer exposed 09/03/2020 No Yes Inactive Problems ICD-10 Code Description Active Date Inactive Date L89.893 Pressure ulcer of other site, stage 3 07/15/2020 07/15/2020 M62.81 Muscle weakness (generalized) 07/15/2020 07/15/2020 [name]  ([id number]) [phone].pdf Page 10 of 19 I10 Essential (primary) hypertension 07/15/2020 07/15/2020 M86.171 Other acute osteomyelitis, right ankle and foot 09/03/2020 09/03/2020 Resolved Problems Electronic Signature(s) Signed: 05/25/2023 10:26:02 AM By: [name] MD FACS Entered By: [name] on 05/25/2023 07:26:02 -------------------------------------------------------------------------------- Progress Note Details Patient Name: Date of Service: [name], [name] 05/25/2023 10:15 A M Medical Record Number: [id number] Patient Account Number: [id number] Date of Birth/Sex: Treating RN: [birth year] (47 y.o. M) Primary Care Provider: Dorinda Hill Other Clinician: Referring Provider: Treating Provider/Extender: Jana Hakim in Treatment: 149 Subjective Chief Complaint Information obtained from Patient Bilateral Plantar Foot Ulcers History of Present Illness (HPI) Wounds are12/03/2020 upon evaluation today patient presents for initial inspection here in our clinic concerning issues he has been having with the bottoms of his feet bilaterally. He states these actually occurred as wounds when he was hospitalized for 5 months secondary to Covid. He was apparently with tilting bed where he was in an upright position quite frequently and apparently this occurred in some way shape or form during that time. Fortunately there is no sign of active infection at this time. No fevers, chills, nausea, vomiting, or diarrhea. With that being said he still has substantial wounds on the plantar aspects of his feet Theragen require quite a bit of work to get these to heal. He has been using Santyl currently though that is been problematic both in receiving the medication as well as actually paid for it as it is become quite expensive. Prior to the experience with Covid the patient really did not have any major medical problems other than hypertension he does  have some mild generalized weakness following the Covid experience. 07/22/2020 on evaluation today patient appears to be doing okay in regard to his foot ulcers I feel like the wound beds are showing signs of better improvement that I do believe the Iodoflex is helping in this regard. With that being said he does have a lot of drainage currently and this is somewhat blue/green in nature which is consistent with Pseudomonas. I do think a culture today would be appropriate for [place] to evaluate and see if that is indeed the case I would likely start him on antibiotic orally as well he is not allergic to Cipro knows of no issues he has had in the past 12/21; patient was admitted to the clinic earlier this month with bilateral presumed pressure ulcers on the bottom of his feet apparently related to excessive pressure from a tilt table arrangement in the intensive care unit. Patient relates this to being on ECMO but I am not really sure that is exactly related to that. I must say I have never seen anything like this. He has fairly extensive full-thickness wounds extending from his heel towards his midfoot mostly centered laterally. There is already been some healing distally. He does not appear to have an arterial issue. He has been using gentamicin to the wound surfaces with Iodoflex to help with ongoing debridement 1/6; this is a patient with pressure ulcers on the bottom of  Please make appt. Return A Anesthetic Wound #2 Left Calcaneus (In clinic) Topical Lidocaine 4% applied to wound bed - In clinic Bathing/ Shower/ Hygiene May shower and wash wound with soap and water. Edema Control - Lymphedema / SCD / Other Bilateral Lower Extremities Avoid standing for long periods of time. Moisturize legs daily. - as needed Off-Loading Other: - keep pressure off of the bottom of your feet. Elevate legs throughout the day. Use the Shoe with the PegAssist off-loading insole Additional Orders / Instructions Follow Nutritious Diet - Try to get 70-100 grams of Protein a day+ Coupons for Juven sent with patient. Other: - 500 mg per day to start, then increase to 500mg  twice a day. Also consider adding a zinc supplement. Juven Shake 1-2 times daily. Wound Treatment Wound #2 -  Calcaneus Wound Laterality: Left Cleanser: Normal Saline (Generic) 1 x Per Day/Other:until healed Discharge Instructions: Cleanse the wound with Normal Saline prior to applying a clean dressing using gauze sponges, not tissue or cotton balls. Cleanser: Wound Cleanser (Generic) 1 x Per Day/Other:until healed Discharge Instructions: Cleanse the wound with wound cleanser prior to applying a clean dressing using gauze sponges, not tissue or cotton balls. Peri-Wound Care: Ketoconazole Cream 2% (Generic) 1 x Per Day/Other:until healed Discharge Instructions: Apply Ketoconazole mixed with zinc to the periwound Peri-Wound Care: Zinc Oxide Ointment 30g tube (Generic) 1 x Per Day/Other:until healed Discharge Instructions: Apply Zinc Oxide to periwound with each dressing change Topical: Gentamicin (Generic) 1 x Per Day/Other:until healed Discharge Instructions: apply to the wound bed Prim Dressing: Maxorb Extra Ag+ Alginate Dressing, 4x4.75 (in/in) (Generic) 1 x Per Day/Other:until healed ary Discharge Instructions: Apply to wound bed as instructed Secondary Dressing: Drawtex 4x4 in (Generic) 1 x Per Day/Other:until healed Discharge Instructions: Apply over primary dressing as directed. Secondary Dressing: Optifoam Non-Adhesive Dressing, 4x4 in (Generic) 1 x Per Day/Other:until healed Discharge Instructions: Apply over primary dressing as directed. [name], [name] ([id number]) [id number] Page 9 of 19 Secondary Dressing: Woven Gauze Sponge, Non-Sterile 4x4 in (Generic) 1 x Per Day/Other:until healed Discharge Instructions: Apply over primary dressing as directed. Secured With: 7M Medipore H Soft Cloth Surgical T ape, 4 x 10 (in/yd) (Generic) 1 x Per Day/Other:until healed Discharge Instructions: Secure with tape as directed. Compression Wrap: Kerlix Roll 4.5x3.1 (in/yd) (Generic) 1 x Per Day/Other:until healed Discharge Instructions: Apply Kerlix and Coban compression as  directed. Add-Ons: [employer] Offloading Boot, Size Regular 1 x Per Day/Other:until healed Wound #5 - Foot Wound Laterality: Plantar, Right Topical: Gentamicin 1 x Per Day/30 Days Discharge Instructions: As directed by physician Prim Dressing: Maxorb Extra Ag+ Alginate Dressing, 4x4.75 (in/in) 1 x Per Day/30 Days ary Discharge Instructions: Apply to wound bed as instructed Secondary Dressing: Drawtex 4x4 in 1 x Per Day/30 Days Discharge Instructions: Apply over primary dressing as directed. Secondary Dressing: Optifoam Non-Adhesive Dressing, 4x4 in 1 x Per Day/30 Days Discharge Instructions: Apply over primary dressing as directed. Secured With: [employer], 4.5x3.1 (in/yd) 1 x Per Day/30 Days Discharge Instructions: Secure with Kerlix as directed. Electronic Signature(s) Signed: 05/25/2023 11:04:27 AM By: [name] MD FACS Signed: 05/26/2023 12:08:28 PM By: Brenton Grills Entered By: Brenton Grills on 05/25/2023 07:56:01 -------------------------------------------------------------------------------- Problem List Details Patient Name: Date of Service: [name], TO [name] 05/25/2023 10:15 A M Medical Record Number: [id number] Patient Account Number: [id number] Date of Birth/Sex: Treating RN: 1976-01-07 (47 y.o. [name] Primary Care Provider: Dorinda Hill Other Clinician: Referring Provider: Treating Provider/Extender: Jana Hakim in  surface and minimal eschar around the edges. 03/15/2023: The wound measured narrower today. There is some slough accumulation on the surface and some eschar around the edges. 03/23/2023: The length of the wound remains stable, but it continues to narrow. The skin that has covered the posterior aspect of the calcaneus is more robust and has better tensile integrity. Minimal slough and eschar accumulation. 03/30/2023: The wound length has decreased. This seems to be primarily due to more epithelium covering the posterior aspect of the site. Minimal slough and eschar buildup. 04/07/2023: The wound measured a little bit smaller again today. There is minimal slough accumulation with just a little bit of eschar and callus around the edges. 04/13/2023: The lateral aspect of his wound is starting to epithelialize. There is very minimal slough on the surface. Moisture control is good. 04/20/2023: Continued, albeit extremely slow, encroachment of  epithelium as of the wound surface. Minimal slough. Moisture balance is good. 04/27/2023: More epithelium is filling in from the heel area. There is a little bit of slough on the surface with some periwound callus and eschar. He did note a little bit of drainage coming from his right foot and there is a small crack in the skin extending medially from the main site of his prior ulceration. 05/04/2023: The wounds are stable this week. No significant change. There is a little bit of callus buildup around the edges of each site and some thin slough on the surface on the left foot. 05/11/2023: The skin on the right heel is filling in further. There is a little bit of slough accumulation on both surfaces along with periwound callus accumulation. 05/18/2023: Both wounds are smaller today. The right foot is down to just a sliver of opening. 05/25/2023: Epithelium continues to advance from the calcaneal area towards the midfoot on the left. On the right, there is tiniest crack of an opening that remains. Electronic Signature(s) Signed: 05/25/2023 10:28:40 AM By: [name] MD FACS Entered By: [name] on 05/25/2023 07:28:40 -------------------------------------------------------------------------------- Physical Exam Details Patient Name: Date of Service: [name], [name] 05/25/2023 10:15 A M Medical Record Number: [id number] Patient Account Number: [id number] Date of Birth/Sex: Treating RN: 08-09-1975 (47 y.o. M) Primary Care Provider: Dorinda Hill Other Clinician: Referring Provider: Treating Provider/Extender: Jana Hakim in Treatment: 149 Constitutional . . . . no acute distress. Respiratory Normal work of breathing on room air. Notes 05/25/2023: Epithelium continues to advance from the calcaneal area towards the midfoot on the left. On the right, there is tiniest crack of an opening that remains. Electronic Signature(s) Signed: 05/25/2023 10:29:12 AM By:  [name] MD FACS Entered By: [name] on 05/25/2023 07:29:12 [name] ([id number]) [id number] Page 8 of 19 -------------------------------------------------------------------------------- Physician Orders Details Patient Name: Date of Service: [name] [place] 05/25/2023 10:15 A M Medical Record Number: [id number] Patient Account Number: [id number] Date of Birth/Sex: Treating RN: [birth year] (47 y.o. [name] Primary Care Provider: Dorinda Hill Other Clinician: Referring Provider: Treating Provider/Extender: Jana Hakim in Treatment: 149 The following information was scribed by: Brenton Grills The information was scribed for: [name] Verbal / Phone Orders: No Diagnosis Coding ICD-10 Coding Code Description [phone] Non-pressure chronic ulcer of other part of right foot with fat layer exposed L97.522 Non-pressure chronic ulcer of other part of left foot with fat layer exposed Follow-up Appointments ppointment in 1 week. - Dr. Lady Gary Room 3 - 06/01/2023 at 10:15am Return A ppointment in 2 weeks. -  Woven Gauze Sponge, Non-Sterile 4x4 in (Generic) 1 x Per Day/Other:until healed Discharge Instructions: Apply over primary dressing as directed. Secured With: 65M Medipore H Soft Cloth Surgical T ape, 4 x 10 (in/yd) (Generic) 1 x Per Day/Other:until healed Discharge Instructions: Secure with tape as directed. Com pression Wrap: Kerlix Roll 4.5x3.1 (in/yd) (Generic) 1 x Per Day/Other:until healed Discharge Instructions: Apply Kerlix and Coban compression as directed. Add-Ons: [employer] Offloading Boot, Size Regular 1 x Per Day/Other:until healed WOUND #5: - Foot Wound Laterality: Plantar, Right Topical: Gentamicin 1 x Per Day/30 Days Discharge Instructions: As directed by physician Prim Dressing: Maxorb Extra Ag+ Alginate Dressing, 4x4.75 (in/in) 1 x Per Day/30 Days ary Discharge Instructions: Apply to wound bed as instructed Secondary Dressing: Drawtex 4x4 in 1 x Per Day/30 Days Discharge Instructions: Apply over primary dressing as directed. Secondary Dressing: Optifoam Non-Adhesive Dressing, 4x4 in 1 x Per Day/30 Days Discharge Instructions: Apply over primary dressing as directed. [name], [name] ([id number]) [id number] Page 17 of 19 Secured With: [employer], 4.5x3.1 (in/yd) 1 x Per Day/30 Days Discharge Instructions: Secure with Kerlix as directed. 05/25/2023: Epithelium continues to advance from the calcaneal area towards the midfoot on the left. On the right, there is tiniest crack of an opening that remains. I used a  curette to debride some callus on the right and some slough from the left. We will continue topical gentamicin and silver alginate to both sites. Continue periwound ketoconazole on the left. Continue offloading sandals. Follow-up in 1 week. Electronic Signature(s) Signed: 05/25/2023 6:11:41 PM By: Shawn Stall RN, BSN Signed: 05/26/2023 7:45:11 AM By: [name] MD FACS Previous Signature: 05/25/2023 10:30:21 AM Version By: [name] MD FACS Entered By: Shawn [name] on 05/25/2023 15:07:04 -------------------------------------------------------------------------------- HxROS Details Patient Name: Date of Service: [name], TO [name] 05/25/2023 10:15 A M Medical Record Number: [id number] Patient Account Number: [id number] Date of Birth/Sex: Treating RN: [birth year] (47 y.o. M) Primary Care Provider: Dorinda Hill Other Clinician: Referring Provider: Treating Provider/Extender: Jana Hakim in Treatment: 149 Information Obtained From Patient Constitutional Symptoms (General Health) Medical History: Past Medical History Notes: COVID PNA 07/22/2019-11/14/2019 [facility] ECMO, foot drop left foot , Eyes Medical History: Negative for: Cataracts; Glaucoma; Optic Neuritis Ear/Nose/Mouth/Throat Medical History: Negative for: Chronic sinus problems/congestion; Middle ear problems Hematologic/Lymphatic Medical History: Negative for: Anemia; Hemophilia; Human Immunodeficiency Virus; Lymphedema; Sickle Cell Disease Respiratory Medical History: Positive for: Asthma Negative for: Aspiration; Chronic Obstructive Pulmonary Disease (COPD); Pneumothorax; Sleep Apnea; Tuberculosis Cardiovascular Medical History: Positive for: Angina - with COVID; Hypertension Negative for: Arrhythmia; Congestive Heart Failure; Coronary Artery Disease; Deep Vein Thrombosis; Hypotension; Myocardial Infarction; Peripheral Arterial Disease; Peripheral Venous Disease; Phlebitis;  Vasculitis Gastrointestinal Medical History: Negative for: Cirrhosis ; Colitis; Crohns; Hepatitis A; Hepatitis B; Hepatitis [name], [name] ([id number]) [id number] Page 18 of 19 Endocrine Medical History: Negative for: Type I Diabetes; Type II Diabetes Genitourinary Medical History: Negative for: End Stage Renal Disease Past Medical History Notes: kidney stone Immunological Medical History: Negative for: Lupus Erythematosus; Raynauds; Scleroderma Integumentary (Skin) Medical History: Negative for: History of Burn Musculoskeletal Medical History: Negative for: Gout; Rheumatoid Arthritis; Osteoarthritis; Osteomyelitis Neurologic Medical History: Negative for: Dementia; Neuropathy; Quadriplegia; Paraplegia; Seizure Disorder Oncologic Medical History: Negative for: Received Chemotherapy; Received Radiation Psychiatric Medical History: Negative for: Anorexia/bulimia; Confinement Anxiety Past Medical History Notes: anxiety Immunizations Pneumococcal Vaccine: Received Pneumococcal Vaccination: No Implantable Devices None Hospitalization / Surgery History Type of Hospitalization/Surgery COVID PNA 07/22/2019- 11/14/2019 03/27/2020 wound debridement/ skin graft Family and  Woven Gauze Sponge, Non-Sterile 4x4 in (Generic) 1 x Per Day/Other:until healed Discharge Instructions: Apply over primary dressing as directed. Secured With: 65M Medipore H Soft Cloth Surgical T ape, 4 x 10 (in/yd) (Generic) 1 x Per Day/Other:until healed Discharge Instructions: Secure with tape as directed. Com pression Wrap: Kerlix Roll 4.5x3.1 (in/yd) (Generic) 1 x Per Day/Other:until healed Discharge Instructions: Apply Kerlix and Coban compression as directed. Add-Ons: [employer] Offloading Boot, Size Regular 1 x Per Day/Other:until healed WOUND #5: - Foot Wound Laterality: Plantar, Right Topical: Gentamicin 1 x Per Day/30 Days Discharge Instructions: As directed by physician Prim Dressing: Maxorb Extra Ag+ Alginate Dressing, 4x4.75 (in/in) 1 x Per Day/30 Days ary Discharge Instructions: Apply to wound bed as instructed Secondary Dressing: Drawtex 4x4 in 1 x Per Day/30 Days Discharge Instructions: Apply over primary dressing as directed. Secondary Dressing: Optifoam Non-Adhesive Dressing, 4x4 in 1 x Per Day/30 Days Discharge Instructions: Apply over primary dressing as directed. [name], [name] ([id number]) [id number] Page 17 of 19 Secured With: [employer], 4.5x3.1 (in/yd) 1 x Per Day/30 Days Discharge Instructions: Secure with Kerlix as directed. 05/25/2023: Epithelium continues to advance from the calcaneal area towards the midfoot on the left. On the right, there is tiniest crack of an opening that remains. I used a  curette to debride some callus on the right and some slough from the left. We will continue topical gentamicin and silver alginate to both sites. Continue periwound ketoconazole on the left. Continue offloading sandals. Follow-up in 1 week. Electronic Signature(s) Signed: 05/25/2023 6:11:41 PM By: Shawn Stall RN, BSN Signed: 05/26/2023 7:45:11 AM By: [name] MD FACS Previous Signature: 05/25/2023 10:30:21 AM Version By: [name] MD FACS Entered By: Shawn [name] on 05/25/2023 15:07:04 -------------------------------------------------------------------------------- HxROS Details Patient Name: Date of Service: [name], TO [name] 05/25/2023 10:15 A M Medical Record Number: [id number] Patient Account Number: [id number] Date of Birth/Sex: Treating RN: [birth year] (47 y.o. M) Primary Care Provider: Dorinda Hill Other Clinician: Referring Provider: Treating Provider/Extender: Jana Hakim in Treatment: 149 Information Obtained From Patient Constitutional Symptoms (General Health) Medical History: Past Medical History Notes: COVID PNA 07/22/2019-11/14/2019 VENT ECMO, foot drop left foot , Eyes Medical History: Negative for: Cataracts; Glaucoma; Optic Neuritis Ear/Nose/Mouth/Throat Medical History: Negative for: Chronic sinus problems/congestion; Middle ear problems Hematologic/Lymphatic Medical History: Negative for: Anemia; Hemophilia; Human Immunodeficiency Virus; Lymphedema; Sickle Cell Disease Respiratory Medical History: Positive for: Asthma Negative for: Aspiration; Chronic Obstructive Pulmonary Disease (COPD); Pneumothorax; Sleep Apnea; Tuberculosis Cardiovascular Medical History: Positive for: Angina - with COVID; Hypertension Negative for: Arrhythmia; Congestive Heart Failure; Coronary Artery Disease; Deep Vein Thrombosis; Hypotension; Myocardial Infarction; Peripheral Arterial Disease; Peripheral Venous Disease; Phlebitis;  Vasculitis Gastrointestinal Medical History: Negative for: Cirrhosis ; Colitis; Crohns; Hepatitis A; Hepatitis B; Hepatitis [name], [name] ([id number]) [id number] Page 18 of 19 Endocrine Medical History: Negative for: Type I Diabetes; Type II Diabetes Genitourinary Medical History: Negative for: End Stage Renal Disease Past Medical History Notes: kidney stone Immunological Medical History: Negative for: Lupus Erythematosus; Raynauds; Scleroderma Integumentary (Skin) Medical History: Negative for: History of Burn Musculoskeletal Medical History: Negative for: Gout; Rheumatoid Arthritis; Osteoarthritis; Osteomyelitis Neurologic Medical History: Negative for: Dementia; Neuropathy; Quadriplegia; Paraplegia; Seizure Disorder Oncologic Medical History: Negative for: Received Chemotherapy; Received Radiation Psychiatric Medical History: Negative for: Anorexia/bulimia; Confinement Anxiety Past Medical History Notes: anxiety Immunizations Pneumococcal Vaccine: Received Pneumococcal Vaccination: No Implantable Devices None Hospitalization / Surgery History Type of Hospitalization/Surgery COVID PNA 07/22/2019- 11/14/2019 03/27/2020 wound debridement/ skin graft Family and  Social History Cancer: Yes - Maternal Grandparents; Diabetes: Yes - Father,Paternal Grandparents; Heart Disease: Yes - Maternal Grandparents; Hereditary Spherocytosis: No; Hypertension: Yes - Father,Paternal Grandparents; Kidney Disease: No; Lung Disease: Yes - Siblings; Seizures: No; Stroke: No; Thyroid Problems: No; Tuberculosis: No; Never smoker; Marital Status - Married; Alcohol Use: Never; Drug Use: No History; Caffeine Use: Daily - tea, soda; Financial Concerns: No; Food, Clothing or Shelter Needs: No; Support System Lacking: No; Transportation Concerns: No Electronic Signature(s) Signed: 05/25/2023 11:04:27 AM By: [name] MD FACS Entered By: [name] on  05/25/2023 57:84:69 SuperBill Details -------------------------------------------------------------------------------- [name] ([id number]) [id number] Page 19 of 19 Patient Name: Date of Service: [name], TO [place] 05/25/2023 Medical Record Number: [id number] Patient Account Number: [id number] Date of Birth/Sex: Treating RN: 1975/10/18 (46 y.o. M) Primary Care Provider: Dorinda Hill Other Clinician: Referring Provider: Treating Provider/Extender: Jana Hakim in Treatment: 149 Diagnosis Coding ICD-10 Codes Code Description [phone] Non-pressure chronic ulcer of other part of right foot with fat layer exposed L97.522 Non-pressure chronic ulcer of other part of left foot with fat layer exposed Facility Procedures CPT4 Code Description Modifier Quantity [id number] [phone] - DEBRIDE WOUND 1ST 20 SQ CM OR < 1 ICD-10 Diagnosis Description L97.512 Non-pressure chronic ulcer of other part of right foot with fat layer exposed L97.522 Non-pressure chronic ulcer of other part of left foot with fat layer exposed Physician Procedures Quantity CPT4 Code Description Modifier [id number] - WC PHYS LEVEL 4 - EST PT 25 1 ICD-10 Diagnosis Description L97.512 Non-pressure chronic ulcer of other part of right foot with fat layer exposed L97.522 Non-pressure chronic ulcer of other part of left foot with fat layer exposed [id number] - WC PHYS DEBR WO ANESTH 20 SQ CM 1 ICD-10 Diagnosis Description L97.512 Non-pressure chronic ulcer of other part of right foot with fat layer exposed L97.522 Non-pressure chronic ulcer of other part of left foot with fat layer exposed Electronic Signature(s) Signed: 05/25/2023 10:30:40 AM By: [name] MD FACS Entered By: [name] on 05/25/2023 07:30:40  Woven Gauze Sponge, Non-Sterile 4x4 in (Generic) 1 x Per Day/Other:until healed Discharge Instructions: Apply over primary dressing as directed. Secured With: 65M Medipore H Soft Cloth Surgical T ape, 4 x 10 (in/yd) (Generic) 1 x Per Day/Other:until healed Discharge Instructions: Secure with tape as directed. Com pression Wrap: Kerlix Roll 4.5x3.1 (in/yd) (Generic) 1 x Per Day/Other:until healed Discharge Instructions: Apply Kerlix and Coban compression as directed. Add-Ons: [employer] Offloading Boot, Size Regular 1 x Per Day/Other:until healed WOUND #5: - Foot Wound Laterality: Plantar, Right Topical: Gentamicin 1 x Per Day/30 Days Discharge Instructions: As directed by physician Prim Dressing: Maxorb Extra Ag+ Alginate Dressing, 4x4.75 (in/in) 1 x Per Day/30 Days ary Discharge Instructions: Apply to wound bed as instructed Secondary Dressing: Drawtex 4x4 in 1 x Per Day/30 Days Discharge Instructions: Apply over primary dressing as directed. Secondary Dressing: Optifoam Non-Adhesive Dressing, 4x4 in 1 x Per Day/30 Days Discharge Instructions: Apply over primary dressing as directed. [name], [name] ([id number]) [id number] Page 17 of 19 Secured With: [employer], 4.5x3.1 (in/yd) 1 x Per Day/30 Days Discharge Instructions: Secure with Kerlix as directed. 05/25/2023: Epithelium continues to advance from the calcaneal area towards the midfoot on the left. On the right, there is tiniest crack of an opening that remains. I used a  curette to debride some callus on the right and some slough from the left. We will continue topical gentamicin and silver alginate to both sites. Continue periwound ketoconazole on the left. Continue offloading sandals. Follow-up in 1 week. Electronic Signature(s) Signed: 05/25/2023 6:11:41 PM By: Shawn Stall RN, BSN Signed: 05/26/2023 7:45:11 AM By: [name] MD FACS Previous Signature: 05/25/2023 10:30:21 AM Version By: [name] MD FACS Entered By: Shawn [name] on 05/25/2023 15:07:04 -------------------------------------------------------------------------------- HxROS Details Patient Name: Date of Service: [name], TO [name] 05/25/2023 10:15 A M Medical Record Number: [id number] Patient Account Number: [id number] Date of Birth/Sex: Treating RN: [birth year] (47 y.o. M) Primary Care Provider: Dorinda Hill Other Clinician: Referring Provider: Treating Provider/Extender: Jana Hakim in Treatment: 149 Information Obtained From Patient Constitutional Symptoms (General Health) Medical History: Past Medical History Notes: COVID PNA 07/22/2019-11/14/2019 VENT ECMO, foot drop left foot , Eyes Medical History: Negative for: Cataracts; Glaucoma; Optic Neuritis Ear/Nose/Mouth/Throat Medical History: Negative for: Chronic sinus problems/congestion; Middle ear problems Hematologic/Lymphatic Medical History: Negative for: Anemia; Hemophilia; Human Immunodeficiency Virus; Lymphedema; Sickle Cell Disease Respiratory Medical History: Positive for: Asthma Negative for: Aspiration; Chronic Obstructive Pulmonary Disease (COPD); Pneumothorax; Sleep Apnea; Tuberculosis Cardiovascular Medical History: Positive for: Angina - with COVID; Hypertension Negative for: Arrhythmia; Congestive Heart Failure; Coronary Artery Disease; Deep Vein Thrombosis; Hypotension; Myocardial Infarction; Peripheral Arterial Disease; Peripheral Venous Disease; Phlebitis;  Vasculitis Gastrointestinal Medical History: Negative for: Cirrhosis ; Colitis; Crohns; Hepatitis A; Hepatitis B; Hepatitis [name], [name] ([id number]) [id number] Page 18 of 19 Endocrine Medical History: Negative for: Type I Diabetes; Type II Diabetes Genitourinary Medical History: Negative for: End Stage Renal Disease Past Medical History Notes: kidney stone Immunological Medical History: Negative for: Lupus Erythematosus; Raynauds; Scleroderma Integumentary (Skin) Medical History: Negative for: History of Burn Musculoskeletal Medical History: Negative for: Gout; Rheumatoid Arthritis; Osteoarthritis; Osteomyelitis Neurologic Medical History: Negative for: Dementia; Neuropathy; Quadriplegia; Paraplegia; Seizure Disorder Oncologic Medical History: Negative for: Received Chemotherapy; Received Radiation Psychiatric Medical History: Negative for: Anorexia/bulimia; Confinement Anxiety Past Medical History Notes: anxiety Immunizations Pneumococcal Vaccine: Received Pneumococcal Vaccination: No Implantable Devices None Hospitalization / Surgery History Type of Hospitalization/Surgery COVID PNA 07/22/2019- 11/14/2019 03/27/2020 wound debridement/ skin graft Family and

## 2023-05-28 NOTE — Therapy (Signed)
OUTPATIENT PHYSICAL THERAPY TREATMENT NOTE   Patient Name: [name] MRN: [id number] DOB:Dec 24, 1975, 47 y.o., male Today's Date: 05/30/2023  END OF SESSION:  PT End of Session - 05/29/23 1201     Visit Number 5    Number of Visits 8    Date for PT Re-Evaluation 07/10/23    Authorization Type HTA    PT Start Time 1110    PT Stop Time 1156    PT Time Calculation (min) 46 min    Activity Tolerance Patient tolerated treatment well    Behavior During Therapy WFL for tasks assessed/performed                 Past Medical History:  Diagnosis Date   Anginal pain (HCC)    with covid   Anxiety    Asthma    Depression    Dyspnea    GERD (gastroesophageal reflux disease)    Headache    History of kidney stones    LEFT URETERAL STONE   HTN (hypertension)    Pancreatitis 2018   GALLBALDDER SLUDGE CAUSED ISSUED RESOLVED   Pneumonia 07/2019   covid   Prediabetes    SOBOE (shortness of breath on exertion)    Past Surgical History:  Procedure Laterality Date   CANNULATION FOR ECMO (EXTRACORPOREAL MEMBRANE OXYGENATION) N/A 08/28/2019   Procedure: CANNULATION FOR VV ECMO (EXTRACORPOREAL MEMBRANE OXYGENATION);  Surgeon: Donata Clay, Theron Arista, MD;  Location: [facility] OR;  Service: Open Heart Surgery;  Laterality: N/A;  CRESCENT CANNULA   CANNULATION FOR ECMO (EXTRACORPOREAL MEMBRANE OXYGENATION) N/A 09/10/2019   Procedure: CANNULATION FOR ECMO (EXTRACORPOREAL MEMBRANE OXYGENATION) PUTTING IN CRESCENT 32FR CANNULA  AND REMOVING GROING CANNULATION;  Surgeon: Linden Dolin, MD;  Location: [facility] OR;  Service: Open Heart Surgery;  Laterality: N/A;  PUTTING IN CRESCENT/REMOVING GROIN CANNULATION   CYSTOSCOPY/URETEROSCOPY/HOLMIUM LASER/STENT PLACEMENT Left 04/18/2019   Procedure: LEFT URETEROSCOPY/HOLMIUM LASER/STENT PLACEMENT;  Surgeon: Crist Fat, MD;  Location: [facility];  Service: Urology;  Laterality: Left;   CYSTOSCOPY/URETEROSCOPY/HOLMIUM LASER/STENT PLACEMENT  Left 05/02/2019   Procedure: CYSTOSCOPY/URETEROSCOPY/HOLMIUM LASER/STENT EXCHANGE;  Surgeon: Crist Fat, MD;  Location: [facility] ORS;  Service: Urology;  Laterality: Left;   ECMO CANNULATION N/A 08/03/2019   Procedure: ECMO CANNULATION;  Surgeon: Linden Dolin, MD;  Location: [facility];  Service: Cardiovascular;  Laterality: N/A;   ESOPHAGOGASTRODUODENOSCOPY N/A 09/11/2019   Procedure: ESOPHAGOGASTRODUODENOSCOPY (EGD);  Surgeon: Linden Dolin, MD;  Location: [facility] OR;  Service: Thoracic;  Laterality: N/A;   GRAFT APPLICATION Bilateral 03/27/2020   Procedure: APPLICATION OF SKIN GRAFT BILATERAL FEET;  Surgeon: Felecia Shelling, DPM;  Location: [facility];  Service: Podiatry;  Laterality: Bilateral;   IR REPLC GASTRO/COLONIC TUBE PERCUT W/FLUORO  10/14/2019   IRRIGATION AND DEBRIDEMENT SHOULDER Right 09/29/2017   Procedure: IRRIGATION AND DEBRIDEMENT SHOULDER;  Surgeon: Tarry Kos, MD;  Location: [facility] OR;  Service: Orthopedics;  Laterality: Right;   LUMBAR DISC SURGERY  2002   NASAL ENDOSCOPY WITH EPISTAXIS CONTROL N/A 08/31/2019   Procedure: NASAL ENDOSCOPY WITH EPISTAXIS CONTROL WITH CAUTERIZATION;  Surgeon: Christia Reading, MD;  Location: [facility] OR;  Service: ENT;  Laterality: N/A;   PORTACATH PLACEMENT N/A 09/11/2019   Procedure: PEG TUBE INSERTION - BEDSIDE;  Surgeon: Linden Dolin, MD;  Location: [facility] OR;  Service: Thoracic;  Laterality: N/A;   TEE WITHOUT CARDIOVERSION N/A 08/28/2019   Procedure: TRANSESOPHAGEAL ECHOCARDIOGRAM (TEE);  Surgeon: Donata Clay, Theron Arista, MD;  Location: [facility] OR;  Service: Open Heart  Surgery;  Laterality: N/A;   TEE WITHOUT CARDIOVERSION N/A 09/10/2019   Procedure: TRANSESOPHAGEAL ECHOCARDIOGRAM (TEE);  Surgeon: Linden Dolin, MD;  Location: [facility] OR;  Service: Open Heart Surgery;  Laterality: N/A;   WOUND DEBRIDEMENT Bilateral 03/27/2020   Procedure: EXCISIONAL DEBRIDEMENT OF ULCERS BILATERAL FEET;  Surgeon: Felecia Shelling, DPM;  Location: [facility];  Service: Podiatry;   Laterality: Bilateral;   Patient Active Problem List   Diagnosis Date Noted   Type 2 diabetes mellitus without complication, without long-term current use of insulin (HCC) 10/18/2022   Vitamin D deficiency 10/18/2022   Other fatigue 09/07/2022   SOBOE (shortness of breath on exertion) 09/07/2022   Nocturnal hypoxemia 09/07/2022   Mood disorder (HCC) 09/07/2022   Ulcer of left foot (HCC) 09/07/2022   Depression 08/23/2022   Prediabetes 08/23/2022   Obstructive sleep apnea 01/11/2022   Insomnia due to other mental disorder 12/06/2021   Left foot drop 09/06/2021   Wheelchair dependence 06/04/2021   Encounter for therapeutic drug monitoring 09/29/2020   COVID-19 long hauler manifesting chronic dyspnea 09/15/2020   Morbid obesity (HCC) with starting BMI 46 09/15/2020   Encounter for attention to tracheostomy (HCC) 09/15/2020   Critical illness polyneuropathy (HCC) 09/15/2020   Moderate protein-calorie malnutrition (HCC) 09/15/2020   Pyogenic inflammation of bone (HCC) 09/09/2020   Long COVID 09/09/2020   Acute osteomyelitis (HCC) 08/31/2020   MSSA (methicillin susceptible Staphylococcus aureus) infection 08/31/2020   Reactive depression 07/01/2020   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   Impaired sensation to light touch 04/15/2020   Diffuse alveolar damage (HCC) 03/09/2020   Eschar of foot    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   History of COVID-19    Pressure injury of skin 08/22/2019   Need for emotional support    Chest tube in place    Personal history of ECMO    Advanced care planning/counseling discussion    Palliative care by specialist    Goals of care, counseling/discussion    Advanced directives, counseling/discussion    Subcutaneous crepitus    Thrombocytopenia (HCC)    Leukopenia    Fever    Gram positive bacterial infection    Septic arthritis of right acromioclavicular joint (HCC) 09/29/2017   Chronic left-sided low back  pain with left-sided sciatica 09/19/2017   Epigastric pain    Primary hypertension 04/17/2017   Moderate persistent asthma 07/21/2015   Allergic rhinoconjunctivitis 07/21/2015   GERD (gastroesophageal reflux disease) 07/21/2015   Abnormal gait 11/12/2009   TARSAL TUNNEL SYNDROME, LEFT 10/08/2009   PES PLANUS 10/08/2009     REFERRING PROVIDER: Berna Bue, MD   REFERRING DIAG: E66.01 (ICD-10-CM) - Morbid obesity due to excess calories   Rationale for Evaluation and Treatment: Rehabilitation  THERAPY DIAG:  Muscle weakness (generalized)  Stiffness of right knee, not elsewhere classified  Stiffness of left knee, not elsewhere classified  Difficulty in walking, not elsewhere classified  ONSET DATE: PT order 02/03/2023  SUBJECTIVE:  SUBJECTIVE STATEMENT: Pt states he is doing his exercises a little bit more, but not as much as he should.  Pt denies any adverse effects after prior Rx.  Pt states both of his feet are healing and still no change in Wb'ing.  Pt sees wound care weekly.  Pt reports 30% improvement in stamina/tolerance with daily activities.  Pt states his legs and arms feel a little stronger.  He reports his shoulders are still weak.  Pt reports improved mobility.  Pt is planning to have bariatric surgery.  MD referred pt to PT to work on some conditioning exercises.  PT order 02/03/2023.  Reason for referral:  deconditioning      PERTINENT HISTORY:  L foot ulcer--Pt primarily in W/C and not putting much weight through L LE.  Pt was instructed to stay off his foot as much as possible.  He is in a offloader shoe. Debridement of ulcers in bilat feet and skin graft in 2021.  R foot ulcer with good healing.  Pt receives wound care for L foot ulcer.  Pt states he has some dropfoot on L.    Long covid, chronic back pain, arthritis, depression, HTN, Dyspnea,  Lumbar surgery for bulging disc in 2002 R shoulder arthrotomy of AC jt, irrigation and debridement of distal clavicle, and partial excision of distal clavicle on 2019   PAIN:  Location:  back NPRS:  0/10 current, 8/10 worst, 2/10 best  Location:  bilat NPRS:  4/10  PRECAUTIONS: L foot ulcer--avoid WB'ing of L LE, long covid, lumbar surgery in 2002    WEIGHT BEARING RESTRICTIONS: Pt states he doesn't have specific WB'ing restrictions though was instructed to stay off his foot as much as possible due to L foot ulcer.    FALLS:  Has patient fallen in last 6 months? Yes. Number of falls 2  LIVING ENVIRONMENT: Lives with: lives with their family Lives in: 1 story home Stairs: 5-6 steps with rails; has a ramp Has following equipment at home: W/C, FWW  OCCUPATION: Pt is on disability.  PLOF: Independent  PATIENT GOALS: improve muscle strength and stamina, to be able to walk again.  Pt wants to be able to perform yard work.  He wants to be able to stand to cook and take a shower.    OBJECTIVE:   DIAGNOSTIC FINDINGS:  Chest x ray on 03/02/2023: FINDINGS: Stable cardiac and mediastinal contours. Similar scarring within the left mid and lower lung and right upper lung. No pleural effusion or pneumothorax. Thoracic spine degenerative changes.   IMPRESSION: No acute cardiopulmonary process. Similar scarring.   TODAY'S TREATMENT:                                                                                                                                 LOWER EXTREMITY ROM:      AROM Right eval Left eval Right 10/21 Left 10/21  Hip flexion  Hip extension        Hip abduction        Hip adduction        Hip internal rotation        Hip external rotation        Knee flexion 111 108 114 112  Knee extension 3/0 AROM/PROM 0 2 0  Ankle dorsiflexion 9 limited 8 -13 deg  Ankle plantarflexion 46       Ankle inversion        Ankle eversion         (Blank rows = not tested)    LE strength: Hip flexion:  R:  5/5, L:  5/5    Pt performed:   Seated march with GTB 2x10 reps   LAQ 2# x 10 reps, 4# 2x10 reps   Seated knee flexion AROM x 10 bilat (open chain), RTB x10 bilat     LEFS:  INITIAL/CURRENT:  8/80 / 25/80         See below for pt education.       PATIENT EDUCATION:  Education details:  POC, HEP, exercise form, relevant anatomy, objective findings, goal progress, and rationale of exercises.  PT encouraged pt to be compliant with HEP. Person educated: Patient and wife Education method: Explanation, demonstration, verbal and tactile cues Education comprehension: verbalized understanding, returned demonstration, verbal and tactile cues required  HOME EXERCISE PROGRAM: Access Code: [email or web address] URL: [email or web address] Date: 04/17/2023 Prepared by: Aaron Edelman  Exercises - Seated March  - 1-2 x daily - 7 x weekly - 1-2 sets - 10 reps - Seated Long Arc Quad  - 1 x daily - 7 x weekly - 2 sets - 10 reps - Seated Shoulder Row with Anchored Resistance  - 1 x daily - 4-5 x weekly - 2 sets - 10 reps - Seated Bilateral Shoulder External Rotation with Resistance  - 1 x daily - 4 x weekly - 2 sets - 10 reps - Seated Shoulder Horizontal Abduction with Resistance - Palms Down  - 1 x daily - 4 x weekly - 2 sets - 10 reps   ASSESSMENT:  CLINICAL IMPRESSION: Pt is tolerating progression of exercises well without c/o's.  Pt reports 30% improvement in stamina/tolerance with daily activities.  Pt reports improved mobility.  He is limited with WB'ing and and standing activities due to the ulcers in bilat feet.  Pt is taking a few steps at home, but limits his Wb'ing and walking due to the ulcers in his feet.  PT is also limited with ther ex due to ulcers in bilat feet.  Pt demonstrates improved knee flexion by 3/4 deg in R/L respectively.  Pt demonstrates clinically  significant improvement in self perceived disability with LEFS improving from 8/80 to 25/80.  Pt met STG #1, partially met LTG #3, and is progressing toward other LTG's.  He should benefit from cont skilled PT services to address ongoing goals and improve tolerance to activity and overall function.      OBJECTIVE IMPAIRMENTS: decreased activity tolerance, decreased endurance, decreased mobility, difficulty walking, decreased ROM, decreased strength, hypomobility, impaired flexibility, and pain.   ACTIVITY LIMITATIONS: carrying, standing, squatting, stairs, transfers, and locomotion level  PARTICIPATION LIMITATIONS: meal prep, cleaning, laundry, shopping, community activity, and occupation  PERSONAL FACTORS: Fitness and 3+ comorbidities: L foot ulcer, Wb'ing, long covid, lumbar surgery, chronic back pain, arthritis, depression  are also affecting patient's functional outcome.   REHAB POTENTIAL: good for goals  CLINICAL DECISION MAKING: Evolving/moderate complexity  EVALUATION COMPLEXITY: Low   GOALS:   SHORT TERM GOALS: Target date: 05/01/2023   Pt will perform HEP without adverse effects for improved ROM and strength.  Baseline: Goal status: GOAL MET    LONG TERM GOALS: Target date: 07/10/2023  Pt will demo 0 deg of R knee extension AROM and at least 115 deg of bilat knee flexion AROM for improved stiffness and mobility and for improved gait when he is able to ambulate more.   Baseline:  Goal status: PROGRESSING  10/21  2.  Pt will be independent and compliant with HEP for improved pain, ROM, strength, and function.  Baseline:  Goal status: PROGRESSING 10/21  3.  Pt will report at least a 50% improvement in stamina/tolerance with daily activities.  Baseline:  Goal status:  60% MET  10/21    PLAN:  PT FREQUENCY:  once every other week  PT DURATION: 6 weeks  PLANNED INTERVENTIONS: Therapeutic exercises, Therapeutic activity, Neuromuscular re-education, Balance  training, Gait training, Patient/Family education, Self Care, Joint mobilization, Stair training, DME instructions, Electrical stimulation, Spinal mobilization, Cryotherapy, Moist heat, Taping, Ultrasound, Manual therapy, and Re-evaluation.  PLAN FOR NEXT SESSION: Avoid WB'ing of L LE until more healing has taken place and wound care.  Cont with ther ex.  Review and perform HEP.  Knee ROM.  PN completed and will send recert.     Audie Clear III PT, DPT 05/30/23 11:28 AM

## 2023-05-29 ENCOUNTER — Encounter (HOSPITAL_BASED_OUTPATIENT_CLINIC_OR_DEPARTMENT_OTHER): Payer: Self-pay | Admitting: Physical Therapy

## 2023-05-29 ENCOUNTER — Ambulatory Visit (HOSPITAL_BASED_OUTPATIENT_CLINIC_OR_DEPARTMENT_OTHER): Payer: PPO | Admitting: Physical Therapy

## 2023-05-29 DIAGNOSIS — M6281 Muscle weakness (generalized): Secondary | ICD-10-CM

## 2023-05-29 DIAGNOSIS — R262 Difficulty in walking, not elsewhere classified: Secondary | ICD-10-CM

## 2023-05-29 DIAGNOSIS — M25661 Stiffness of right knee, not elsewhere classified: Secondary | ICD-10-CM

## 2023-05-29 DIAGNOSIS — M25662 Stiffness of left knee, not elsewhere classified: Secondary | ICD-10-CM

## 2023-05-30 ENCOUNTER — Encounter: Payer: Self-pay | Admitting: Dietician

## 2023-05-30 ENCOUNTER — Encounter: Payer: PPO | Attending: Surgery | Admitting: Dietician

## 2023-05-30 VITALS — Ht 68.5 in | Wt 303.5 lb

## 2023-05-30 DIAGNOSIS — E669 Obesity, unspecified: Secondary | ICD-10-CM | POA: Diagnosis not present

## 2023-05-30 DIAGNOSIS — E119 Type 2 diabetes mellitus without complications: Secondary | ICD-10-CM | POA: Insufficient documentation

## 2023-05-30 NOTE — Progress Notes (Signed)
Supervised Weight Loss Visit Bariatric Nutrition Education Appt Start Time: 2:01    End Time: 12:27  Planned surgery: undetermined (pt states the surgeon is suggesting the sleeve, he states he is leaning toward the RYGB)  Pt expectation of surgery: more confidence and being healthier; do more with family  Referral stated Supervised Weight Loss (SWL) visits needed: 5  2 out of 5 SWL Appointments   NUTRITION ASSESSMENT   Anthropometrics  Start weight at [facility]: 303.9 lbs (date: 04/14/2023) Height: 68.5 in Weight today: 303.5 lbs BMI: 45.48 kg/m2     Clinical   Pharmacotherapy: History of weight loss medication used: Ozempic and Mounjaro, stating insurance started not covering.  Medical hx: asthma, obesity, sleep apnea, HTN Medications: vit D, nasacort, desyrel, miagra, protonix, singulair,  lamictal, atrovent, norco, synalar, cymbalta, symbicort, norvasc, aluterol,  Labs: vit D 21.1; HDL 33; A1c 6.1; sodium 145; albumin 4.0; RBC 6.06; platelets 132 Notable signs/symptoms: wounds from pressure area in the hospital years ago; arrived in wheel chair Any previous deficiencies? No  Lifestyle & Dietary Hx  Pt states his foot is healing, stating that the doctor was pleased with the progress. Pt states he started taking vitamin C and Zinc along with the Vit D. Pt states he was on a strong antibiotic, stating he cut his finger and needed stitches. Pt states he would like to come off his depression medication. Pt states he is still waking up later, stating he is a night owl. Pt states he has been trying to eat slower. Pt agrees that taking smaller bites and chew well will help him tolerate his food with out drinking.  Estimated daily fluid intake: 64 oz Supplements: vit D, vit C, zinc Current average weekly physical activity: chair exercises; physical therapy once a week  24-Hr Dietary Recall First Meal: skip or 4 egg whites, 1-2 slices of sara lee delights bread, sf jam, [place]  sausage, Fairlife fat free milk Snack: fruit or protein shake or triscuit or wheat thin or cheese its Second Meal: salad with lean meat (deli), lean cuisine steamer bowl or spring mix salad with sliced meat and cheese. Snack: fruit or protein shake or triscuit or wheat thin or cheese its Third Meal: varies, lean meat, vegetable, rice Snack: sometimes baked cheese its Beverages: water, one regular soda a day or every other day  Estimated Energy Needs Calories: 1600  NUTRITION DIAGNOSIS  Overweight/obesity (Barrett-3.3) related to past poor dietary habits and physical inactivity as evidenced by patient w/ planned sleeve or RYGB surgery following dietary guidelines for continued weight loss.   NUTRITION INTERVENTION  Nutrition counseling (C-1) and education (E-2) to facilitate bariatric surgery goals.  Encouraged patient to honor their body's internal hunger and fullness cues.  Throughout the day, check in mentally and rate hunger. Stop eating when satisfied not full regardless of how much food is left on the plate.  Get more if still hungry 20-30 minutes later.  The key is to honor satisfaction so throughout the meal, rate fullness factor and stop when comfortably satisfied not physically full. The key is to honor hunger and fullness without any feelings of guilt or shame.  Pay attention to what the internal cues are, rather than any external factors. This will enhance the confidence you have in listening to your own body and following those internal cues enabling you to increase how often you eat when you are hungry not out of appetite and stop when you are satisfied not full.  Encouraged pt to continue  to eat balanced meals inclusive of non starchy vegetables 2 times a day 7 days a week Encouraged pt to choose lean protein sources: limiting beef, pork, sausage, hotdogs, and lunch meat Encourage pt to choose healthy fats such as plant based limiting animal fats Encouraged pt to continue to drink a  minium 64 fluid ounces with half being plain water to satisfy proper hydration    Pre-Op Goals Progress & New Goals Continue: include a protein food with every meal or snacks Continue: avoid skipping meals; eat every 3-5 hours; keep sleep routine and scheduled meals Continue: Eat non starchy vegetables 2 times a day 7 days a week Continue: practice separating fluids from your meals and snacks; stop drinking 15 minutes before you eat and don't drink while you eat and for 30 minutes after. Continue: increase physical activity; use physical therapy exercises New: slow down at meals and snacks, chew well, aim for satisfaction before fullness. New: avoid sugar sweetened beverages.  Handouts Provided Include    Learning Style & Readiness for Change Teaching method utilized: Visual & Auditory  Demonstrated degree of understanding via: Teach Back  Readiness Level: contemplative Barriers to learning/adherence to lifestyle change: mobility, motivation  RD's Notes for next Visit  Patient progress toward chosen goals.   MONITORING & EVALUATION Dietary intake, weekly physical activity, body weight, and pre-op goals in 1 month.   Next Steps  Patient is to return to [facility] in one month for next SWL visit.

## 2023-05-31 ENCOUNTER — Other Ambulatory Visit (INDEPENDENT_AMBULATORY_CARE_PROVIDER_SITE_OTHER): Payer: Self-pay | Admitting: Family Medicine

## 2023-05-31 DIAGNOSIS — E559 Vitamin D deficiency, unspecified: Secondary | ICD-10-CM

## 2023-06-01 ENCOUNTER — Ambulatory Visit: Payer: PPO | Admitting: Behavioral Health

## 2023-06-01 ENCOUNTER — Encounter (HOSPITAL_BASED_OUTPATIENT_CLINIC_OR_DEPARTMENT_OTHER): Payer: PPO | Admitting: General Surgery

## 2023-06-01 DIAGNOSIS — L89892 Pressure ulcer of other site, stage 2: Secondary | ICD-10-CM | POA: Diagnosis not present

## 2023-06-01 DIAGNOSIS — L89623 Pressure ulcer of left heel, stage 3: Secondary | ICD-10-CM | POA: Diagnosis not present

## 2023-06-01 DIAGNOSIS — L97512 Non-pressure chronic ulcer of other part of right foot with fat layer exposed: Secondary | ICD-10-CM | POA: Diagnosis not present

## 2023-06-01 NOTE — Progress Notes (Signed)
Weeks of Treatment: Open Open N/A Wound Status: No No N/A Wound Recurrence: 2.4x1x0.1 1x0.3x0.1 N/A Measurements L x W x D (cm) 1.885 0.236 N/A A (cm) : rea 0.188 0.024 N/A Volume (cm) : 93.00% -661.30% N/A % Reduction in A rea: 99.30% -700.00% N/A % Reduction in Volume: Category/Stage III Category/Stage II N/A Classification: Medium Medium N/A Exudate A mount: Serous Serosanguineous N/A Exudate Type: amber red, brown N/A Exudate Color: Distinct, outline attached Distinct, outline attached N/A Wound Margin: Large (67-100%) Large (67-100%) N/A Granulation A mount: Pink Red N/A Granulation Quality: Small (1-33%) None Present (0%) N/A Necrotic A mount: Fat Layer (Subcutaneous Tissue): Yes Fat Layer (Subcutaneous Tissue): Yes N/A Exposed Structures: Fascia: No Tendon: No Muscle: No Joint: No Bone: No Small (1-33%) Small (1-33%) N/A Epithelialization: Debridement - Selective/Open Wound Debridement - Selective/Open Wound N/A Debridement: Pre-procedure Verification/Time Out 10:50 10:50 N/A Taken: Lidocaine 4% Topical Solution Lidocaine 4% T opical Solution N/A Pain Control: [employer], Slough N/A Tissue Debrided: Non-Viable Tissue Skin/Epidermis N/A Level: 1.88 1.92 N/A Debridement A (sq cm): rea Curette Curette N/A Instrument: Minimum Minimum N/A Bleeding: Pressure Pressure N/A Hemostasis A chieved: 0 0 N/A Procedural Pain: 0 0 N/A Post Procedural Pain: Procedure was tolerated well Procedure was tolerated well N/A Debridement Treatment Response: 2.4x1x0.1 3.5x0.7x0.4 N/A Post Debridement Measurements L x W x D (cm) 0.188 0.77 N/A Post Debridement Volume: (cm) Category/Stage III Category/Stage II N/A Post Debridement Stage: Callus: Yes Callus: Yes N/A Periwound Skin Texture: Scarring: Yes Scarring: Yes Excoriation: No Rash: No Induration: No Crepitus: No Rash: No Dry/Scaly: Yes Dry/Scaly: Yes N/A Periwound Skin Moisture: Maceration: No Atrophie Blanche: No Hemosiderin Staining: No N/A Periwound Skin Color: Cyanosis: No Ecchymosis: No Erythema: No Hemosiderin Staining: No Mottled: No Pallor: No Rubor: No No Abnormality No Abnormality N/A Temperature: Yes Yes N/A Tenderness on Palpation: Debridement Debridement N/A Procedures Performed: Treatment Notes Electronic Signature(s) Signed: 06/01/2023 11:13:34 AM By: [name] MD FACS Entered By: [name] on 06/01/2023 08:13:34 [name] ([id number]) [id number] Page 4 of 9 -------------------------------------------------------------------------------- Multi-Disciplinary Care Plan Details Patient Name: Date of Service: [name] [place] 06/01/2023 10:15 A M Medical Record Number: [id number] Patient Account Number: [id number] Date of Birth/Sex: Treating RN: [birth year] (47 y.o. [name] Primary Care Shelli Portilla: Dorinda Hill Other Clinician: Referring Arienne Gartin: Treating Joyce Leckey/Extender: Jana Hakim in Treatment: 150 Multidisciplinary Care Plan reviewed with physician Active Inactive Wound/Skin  Impairment Nursing Diagnoses: Impaired tissue integrity Knowledge deficit related to ulceration/compromised skin integrity Goals: Patient/caregiver will verbalize understanding of skin care regimen Date Initiated: 07/15/2020 Target Resolution Date: 06/29/2023 Goal Status: Active Ulcer/skin breakdown will have a volume reduction of 30% by week 4 Date Initiated: 07/15/2020 Date Inactivated: 08/20/2020 Target Resolution Date: 09/03/2020 Goal Status: Unmet Unmet Reason: no major changes. Ulcer/skin breakdown will heal within 14 weeks Date Initiated: 12/04/2020 Date Inactivated: 12/10/2020 Target Resolution Date: 12/10/2020 Unmet Reason: wounds still open at 14 Goal Status: Unmet weeks and today 21 weeks. Interventions: Assess patient/caregiver ability to obtain necessary supplies Assess patient/caregiver ability to perform ulcer/skin care regimen upon admission and as needed Assess ulceration(s) every visit Provide education on ulcer and skin care Treatment Activities: Skin care regimen initiated : 07/15/2020 Topical wound management initiated : 07/15/2020 Notes: Electronic Signature(s) Signed: 06/01/2023 4:45:32 PM By: Zenaida Deed RN, BSN Entered By: Zenaida Deed on 06/01/2023 07:46:06 -------------------------------------------------------------------------------- Pain Assessment Details Patient Name: Date of Service: [name], [name] 06/01/2023 10:15 A M Medical Record Number: [id number] Patient Account  Discharge Instruction: Apply Ketoconazole mixed with zinc to the periwound Zinc Oxide Ointment 30g tube Discharge Instruction: Apply Zinc Oxide to periwound with each dressing change Topical Gentamicin Discharge Instruction: apply to the wound bed Primary Dressing Maxorb Extra Ag+ Alginate Dressing, 4x4.75 (in/in) Discharge Instruction: Apply to wound bed as instructed Secondary Dressing Drawtex 4x4 in Discharge Instruction: Apply over primary dressing as directed. Optifoam Non-Adhesive Dressing, 4x4 in Discharge Instruction: Apply over primary dressing as directed. Woven Gauze Sponge, Non-Sterile 4x4 in Discharge Instruction: Apply over primary dressing as directed. Secured With 27M Medipore H Soft Cloth Surgical T ape, 4 x 10 (in/yd) Discharge Instruction: Secure with tape as directed. Compression Wrap Kerlix Roll 4.5x3.1 (in/yd) Discharge Instruction: Apply Kerlix and Coban compression as directed. Compression Stockings Add-Ons [employer] Offloading Boot, Size Regular Electronic Signature(s) Signed: 06/01/2023 4:45:32 PM By: Zenaida Deed RN, BSN Previous Signature: 06/01/2023 10:35:35 AM Version By: Dayton Scrape Entered By: Zenaida Deed on 06/01/2023 07:45:24 -------------------------------------------------------------------------------- Wound Assessment Details Patient Name: Date of Service: [name], TO [name] 06/01/2023 10:15 A M Medical Record Number: [id number] Patient Account Number: [id number] Date of Birth/Sex: Treating RN: May 10, 1976 (47 y.o. [name] Primary Care Rashel Okeefe: Dorinda Hill Other Clinician: Referring Roen Macgowan: Treating Kampbell Holaway/Extender: Jana Hakim in Treatment: 150 Wound  Status Wound Number: 5 Primary Etiology: Pressure Ulcer BRANDO, LICARI ([id number]) [id number] Page 8 of 9 Wound Location: Right, Plantar Foot Wound Status: Open Wounding Event: Gradually Appeared Comorbid History: Asthma, Angina, Hypertension Date Acquired: 04/27/2023 Weeks Of Treatment: 5 Clustered Wound: No Photos Wound Measurements Length: (cm) 1 Width: (cm) 0.3 Depth: (cm) 0.1 Area: (cm) 0.236 Volume: (cm) 0.024 % Reduction in Area: -661.3% % Reduction in Volume: -700% Epithelialization: Small (1-33%) Tunneling: No Undermining: No Wound Description Classification: Category/Stage II Wound Margin: Distinct, outline attached Exudate Amount: Medium Exudate Type: Serosanguineous Exudate Color: red, brown Foul Odor After Cleansing: No Slough/Fibrino Yes Wound Bed Granulation Amount: Large (67-100%) Exposed Structure Granulation Quality: Red Fat Layer (Subcutaneous Tissue) Exposed: Yes Necrotic Amount: None Present (0%) Periwound Skin Texture Texture Color No Abnormalities Noted: No No Abnormalities Noted: Yes Callus: Yes Temperature / Pain Rash: No Temperature: No Abnormality Scarring: Yes Tenderness on Palpation: Yes Moisture No Abnormalities Noted: No Dry / Scaly: Yes Treatment Notes Wound #5 (Foot) Wound Laterality: Plantar, Right Cleanser Peri-Wound Care Ketoconazole Cream 2% Discharge Instruction: Apply Ketoconazole to periwound as needed Zinc Oxide Ointment 30g tube Discharge Instruction: Apply Zinc Oxide to periwound with each dressing change Topical Gentamicin Discharge Instruction: As directed by physician Primary Dressing Maxorb Extra Ag+ Alginate Dressing, 4x4.75 (in/in) Discharge Instruction: Apply to wound bed as instructed Secondary Dressing Drawtex 4x4 in Discharge Instruction: Apply over primary dressing as directed. [name], BOCHENEK ([id number]) [id number] Page 9 of 9 Optifoam  Non-Adhesive Dressing, 4x4 in Discharge Instruction: Apply over primary dressing as directed. Secured With [employer], 4.5x3.1 (in/yd) Discharge Instruction: Secure with Kerlix as directed. Compression Wrap Compression Stockings Add-Ons Electronic Signature(s) Signed: 06/01/2023 4:45:32 PM By: Zenaida Deed RN, BSN Previous Signature: 06/01/2023 10:35:35 AM Version By: Dayton Scrape Entered By: Zenaida Deed on 06/01/2023 07:44:19 -------------------------------------------------------------------------------- Vitals Details Patient Name: Date of Service: [name], [name] 06/01/2023 10:15 A M Medical Record Number: [id number] Patient Account Number: [id number] Date of Birth/Sex: Treating RN: Jun 05, 1976 (47 y.o. M) Primary Care Marlowe Lawes: Dorinda Hill Other Clinician: Referring Zaiyah Sottile: Treating Kaiser Belluomini/Extender: Jana Hakim in Treatment: 150 Vital Signs Time Taken: 10:27 Temperature (F): 98.1 Height (in): 69 Pulse (bpm):  JABEN, SKILLIN ([id number]) [id number] Page 1 of 9 Visit Report for 06/01/2023 Arrival Information Details Patient Name: Date of Service: [name], TO [name] 06/01/2023 10:15 A M Medical Record Number: [id number] Patient Account Number: [id number] Date of Birth/Sex: Treating RN: 06-12-1976 (47 y.o. M) Primary Care Brittan Butterbaugh: Dorinda Hill Other Clinician: Referring Uniqua Kihn: Treating Kaleab Frasier/Extender: Jana Hakim in Treatment: 150 Visit Information History Since Last Visit Added or deleted any medications: No Patient Arrived: Wheel Chair Any new allergies or adverse reactions: No Arrival Time: 10:26 Had a fall or experienced change in No Accompanied By: son activities of daily living that may affect Transfer Assistance: None risk of falls: Patient Identification Verified: Yes Signs or symptoms of abuse/neglect since last visito No Secondary Verification Process Completed: Yes Hospitalized since last visit: No Patient Requires Transmission-Based Precautions: No Implantable device outside of the clinic excluding No Patient Has Alerts: No cellular tissue based products placed in the center since last visit: Has Dressing in Place as Prescribed: Yes Pain Present Now: Yes Electronic Signature(s) Signed: 06/01/2023 4:45:32 PM By: Zenaida Deed RN, BSN Entered By: Zenaida Deed on 06/01/2023 07:35:07 -------------------------------------------------------------------------------- Encounter Discharge Information Details Patient Name: Date of Service: [name], TO [name] 06/01/2023 10:15 A M Medical Record Number: [id number] Patient Account Number: [id number] Date of Birth/Sex: Treating RN: 01/19/1976 (47 y.o. [name] Primary Care Antionne Enrique: Dorinda Hill Other Clinician: Referring Karrina Lye: Treating Amaree Loisel/Extender: Jana Hakim in Treatment: 150 Encounter Discharge Information Items Post Procedure  Vitals Discharge Condition: Stable Temperature (F): 98.1 Ambulatory Status: Wheelchair Pulse (bpm): 74 Discharge Destination: Home Respiratory Rate (breaths/min): 18 Transportation: Private Auto Blood Pressure (mmHg): 123/72 Accompanied By: son Schedule Follow-up Appointment: Yes Clinical Summary of Care: Patient Declined Electronic Signature(s) Signed: 06/01/2023 4:45:32 PM By: Zenaida Deed RN, BSN Entered By: Zenaida Deed on 06/01/2023 09:31:11 [name] ([id number]) [id number] Page 2 of 9 -------------------------------------------------------------------------------- Lower Extremity Assessment Details Patient Name: Date of Service: [name] [place] 06/01/2023 10:15 A M Medical Record Number: [id number] Patient Account Number: [id number] Date of Birth/Sex: Treating RN: 1976-02-14 (47 y.o. [name] Primary Care Lelar Farewell: Dorinda Hill Other Clinician: Referring Chaddrick Brue: Treating Dadrian Ballantine/Extender: Jana Hakim in Treatment: 150 Edema Assessment Assessed: [name]: No] [Right: No] Edema: [Left: No] [Right: No] Calf Left: Right: Point of Measurement: 29 cm From Medial Instep 43.2 cm Ankle Left: Right: Point of Measurement: 9 cm From Medial Instep 24.4 cm Vascular Assessment Pulses: Dorsalis Pedis Palpable: [Left:Yes] [Right:Yes] Extremity colors, hair growth, and conditions: Extremity Color: [Left:Normal] Hair Growth on Extremity: [Left:Yes] Temperature of Extremity: [Left:Warm] Capillary Refill: [Left:< 3 seconds] [Right:< 3 seconds] Dependent Rubor: [Left:No No] Electronic Signature(s) Signed: 06/01/2023 4:45:32 PM By: Zenaida Deed RN, BSN Entered By: Zenaida Deed on 06/01/2023 07:37:39 -------------------------------------------------------------------------------- Multi Wound Chart Details Patient Name: Date of Service: [name], [name] 06/01/2023 10:15 A M Medical Record Number:  [id number] Patient Account Number: [id number] Date of Birth/Sex: Treating RN: 01/16/1976 (47 y.o. M) Primary Care Lyon Dumont: Dorinda Hill Other Clinician: Referring Seraphim Trow: Treating Abi Shoults/Extender: Jana Hakim in Treatment: 150 Vital Signs Height(in): 69 Pulse(bpm): 74 Weight(lbs): 280 Blood Pressure(mmHg): 123/72 Body Mass Index(BMI): 41.3 Temperature(F): 98.1 Respiratory Rate(breaths/min): 18 [2:Photos:] [N/A:N/A [id number] Page 3 of 9] Left Calcaneus Right, Plantar Foot N/A Wound Location: Pressure Injury Gradually Appeared N/A Wounding Event: Pressure Ulcer Pressure Ulcer N/A Primary Etiology: Asthma, Angina, Hypertension Asthma, Angina, Hypertension N/A Comorbid History: 10/07/2019 04/27/2023 N/A Date Acquired: 150 5 N/A  Discharge Instruction: Apply Ketoconazole mixed with zinc to the periwound Zinc Oxide Ointment 30g tube Discharge Instruction: Apply Zinc Oxide to periwound with each dressing change Topical Gentamicin Discharge Instruction: apply to the wound bed Primary Dressing Maxorb Extra Ag+ Alginate Dressing, 4x4.75 (in/in) Discharge Instruction: Apply to wound bed as instructed Secondary Dressing Drawtex 4x4 in Discharge Instruction: Apply over primary dressing as directed. Optifoam Non-Adhesive Dressing, 4x4 in Discharge Instruction: Apply over primary dressing as directed. Woven Gauze Sponge, Non-Sterile 4x4 in Discharge Instruction: Apply over primary dressing as directed. Secured With 27M Medipore H Soft Cloth Surgical T ape, 4 x 10 (in/yd) Discharge Instruction: Secure with tape as directed. Compression Wrap Kerlix Roll 4.5x3.1 (in/yd) Discharge Instruction: Apply Kerlix and Coban compression as directed. Compression Stockings Add-Ons [employer] Offloading Boot, Size Regular Electronic Signature(s) Signed: 06/01/2023 4:45:32 PM By: Zenaida Deed RN, BSN Previous Signature: 06/01/2023 10:35:35 AM Version By: Dayton Scrape Entered By: Zenaida Deed on 06/01/2023 07:45:24 -------------------------------------------------------------------------------- Wound Assessment Details Patient Name: Date of Service: [name], TO [name] 06/01/2023 10:15 A M Medical Record Number: [id number] Patient Account Number: [id number] Date of Birth/Sex: Treating RN: May 10, 1976 (47 y.o. [name] Primary Care Rashel Okeefe: Dorinda Hill Other Clinician: Referring Roen Macgowan: Treating Kampbell Holaway/Extender: Jana Hakim in Treatment: 150 Wound  Status Wound Number: 5 Primary Etiology: Pressure Ulcer BRANDO, LICARI ([id number]) [id number] Page 8 of 9 Wound Location: Right, Plantar Foot Wound Status: Open Wounding Event: Gradually Appeared Comorbid History: Asthma, Angina, Hypertension Date Acquired: 04/27/2023 Weeks Of Treatment: 5 Clustered Wound: No Photos Wound Measurements Length: (cm) 1 Width: (cm) 0.3 Depth: (cm) 0.1 Area: (cm) 0.236 Volume: (cm) 0.024 % Reduction in Area: -661.3% % Reduction in Volume: -700% Epithelialization: Small (1-33%) Tunneling: No Undermining: No Wound Description Classification: Category/Stage II Wound Margin: Distinct, outline attached Exudate Amount: Medium Exudate Type: Serosanguineous Exudate Color: red, brown Foul Odor After Cleansing: No Slough/Fibrino Yes Wound Bed Granulation Amount: Large (67-100%) Exposed Structure Granulation Quality: Red Fat Layer (Subcutaneous Tissue) Exposed: Yes Necrotic Amount: None Present (0%) Periwound Skin Texture Texture Color No Abnormalities Noted: No No Abnormalities Noted: Yes Callus: Yes Temperature / Pain Rash: No Temperature: No Abnormality Scarring: Yes Tenderness on Palpation: Yes Moisture No Abnormalities Noted: No Dry / Scaly: Yes Treatment Notes Wound #5 (Foot) Wound Laterality: Plantar, Right Cleanser Peri-Wound Care Ketoconazole Cream 2% Discharge Instruction: Apply Ketoconazole to periwound as needed Zinc Oxide Ointment 30g tube Discharge Instruction: Apply Zinc Oxide to periwound with each dressing change Topical Gentamicin Discharge Instruction: As directed by physician Primary Dressing Maxorb Extra Ag+ Alginate Dressing, 4x4.75 (in/in) Discharge Instruction: Apply to wound bed as instructed Secondary Dressing Drawtex 4x4 in Discharge Instruction: Apply over primary dressing as directed. [name], BOCHENEK ([id number]) [id number] Page 9 of 9 Optifoam  Non-Adhesive Dressing, 4x4 in Discharge Instruction: Apply over primary dressing as directed. Secured With [employer], 4.5x3.1 (in/yd) Discharge Instruction: Secure with Kerlix as directed. Compression Wrap Compression Stockings Add-Ons Electronic Signature(s) Signed: 06/01/2023 4:45:32 PM By: Zenaida Deed RN, BSN Previous Signature: 06/01/2023 10:35:35 AM Version By: Dayton Scrape Entered By: Zenaida Deed on 06/01/2023 07:44:19 -------------------------------------------------------------------------------- Vitals Details Patient Name: Date of Service: [name], [name] 06/01/2023 10:15 A M Medical Record Number: [id number] Patient Account Number: [id number] Date of Birth/Sex: Treating RN: Jun 05, 1976 (47 y.o. M) Primary Care Marlowe Lawes: Dorinda Hill Other Clinician: Referring Zaiyah Sottile: Treating Kaiser Belluomini/Extender: Jana Hakim in Treatment: 150 Vital Signs Time Taken: 10:27 Temperature (F): 98.1 Height (in): 69 Pulse (bpm):  Discharge Instruction: Apply Ketoconazole mixed with zinc to the periwound Zinc Oxide Ointment 30g tube Discharge Instruction: Apply Zinc Oxide to periwound with each dressing change Topical Gentamicin Discharge Instruction: apply to the wound bed Primary Dressing Maxorb Extra Ag+ Alginate Dressing, 4x4.75 (in/in) Discharge Instruction: Apply to wound bed as instructed Secondary Dressing Drawtex 4x4 in Discharge Instruction: Apply over primary dressing as directed. Optifoam Non-Adhesive Dressing, 4x4 in Discharge Instruction: Apply over primary dressing as directed. Woven Gauze Sponge, Non-Sterile 4x4 in Discharge Instruction: Apply over primary dressing as directed. Secured With 27M Medipore H Soft Cloth Surgical T ape, 4 x 10 (in/yd) Discharge Instruction: Secure with tape as directed. Compression Wrap Kerlix Roll 4.5x3.1 (in/yd) Discharge Instruction: Apply Kerlix and Coban compression as directed. Compression Stockings Add-Ons [employer] Offloading Boot, Size Regular Electronic Signature(s) Signed: 06/01/2023 4:45:32 PM By: Zenaida Deed RN, BSN Previous Signature: 06/01/2023 10:35:35 AM Version By: Dayton Scrape Entered By: Zenaida Deed on 06/01/2023 07:45:24 -------------------------------------------------------------------------------- Wound Assessment Details Patient Name: Date of Service: [name], TO [name] 06/01/2023 10:15 A M Medical Record Number: [id number] Patient Account Number: [id number] Date of Birth/Sex: Treating RN: May 10, 1976 (47 y.o. [name] Primary Care Rashel Okeefe: Dorinda Hill Other Clinician: Referring Roen Macgowan: Treating Kampbell Holaway/Extender: Jana Hakim in Treatment: 150 Wound  Status Wound Number: 5 Primary Etiology: Pressure Ulcer BRANDO, LICARI ([id number]) [id number] Page 8 of 9 Wound Location: Right, Plantar Foot Wound Status: Open Wounding Event: Gradually Appeared Comorbid History: Asthma, Angina, Hypertension Date Acquired: 04/27/2023 Weeks Of Treatment: 5 Clustered Wound: No Photos Wound Measurements Length: (cm) 1 Width: (cm) 0.3 Depth: (cm) 0.1 Area: (cm) 0.236 Volume: (cm) 0.024 % Reduction in Area: -661.3% % Reduction in Volume: -700% Epithelialization: Small (1-33%) Tunneling: No Undermining: No Wound Description Classification: Category/Stage II Wound Margin: Distinct, outline attached Exudate Amount: Medium Exudate Type: Serosanguineous Exudate Color: red, brown Foul Odor After Cleansing: No Slough/Fibrino Yes Wound Bed Granulation Amount: Large (67-100%) Exposed Structure Granulation Quality: Red Fat Layer (Subcutaneous Tissue) Exposed: Yes Necrotic Amount: None Present (0%) Periwound Skin Texture Texture Color No Abnormalities Noted: No No Abnormalities Noted: Yes Callus: Yes Temperature / Pain Rash: No Temperature: No Abnormality Scarring: Yes Tenderness on Palpation: Yes Moisture No Abnormalities Noted: No Dry / Scaly: Yes Treatment Notes Wound #5 (Foot) Wound Laterality: Plantar, Right Cleanser Peri-Wound Care Ketoconazole Cream 2% Discharge Instruction: Apply Ketoconazole to periwound as needed Zinc Oxide Ointment 30g tube Discharge Instruction: Apply Zinc Oxide to periwound with each dressing change Topical Gentamicin Discharge Instruction: As directed by physician Primary Dressing Maxorb Extra Ag+ Alginate Dressing, 4x4.75 (in/in) Discharge Instruction: Apply to wound bed as instructed Secondary Dressing Drawtex 4x4 in Discharge Instruction: Apply over primary dressing as directed. [name], BOCHENEK ([id number]) [id number] Page 9 of 9 Optifoam  Non-Adhesive Dressing, 4x4 in Discharge Instruction: Apply over primary dressing as directed. Secured With [employer], 4.5x3.1 (in/yd) Discharge Instruction: Secure with Kerlix as directed. Compression Wrap Compression Stockings Add-Ons Electronic Signature(s) Signed: 06/01/2023 4:45:32 PM By: Zenaida Deed RN, BSN Previous Signature: 06/01/2023 10:35:35 AM Version By: Dayton Scrape Entered By: Zenaida Deed on 06/01/2023 07:44:19 -------------------------------------------------------------------------------- Vitals Details Patient Name: Date of Service: [name], [name] 06/01/2023 10:15 A M Medical Record Number: [id number] Patient Account Number: [id number] Date of Birth/Sex: Treating RN: Jun 05, 1976 (47 y.o. M) Primary Care Marlowe Lawes: Dorinda Hill Other Clinician: Referring Zaiyah Sottile: Treating Kaiser Belluomini/Extender: Jana Hakim in Treatment: 150 Vital Signs Time Taken: 10:27 Temperature (F): 98.1 Height (in): 69 Pulse (bpm):

## 2023-06-01 NOTE — Progress Notes (Signed)
MD FACS Signed: 06/01/2023 4:45:32 PM By: Zenaida Deed RN, BSN Previous Signature: 06/01/2023 11:16:02 AM Version By: [name] MD FACS Entered By: Zenaida Deed on 06/01/2023 08:16:17 -------------------------------------------------------------------------------- Problem List Details Patient Name: Date of Service: [name], [name] 06/01/2023 10:15 A M Medical Record Number: [id number] Patient Account Number: [id number] Date of Birth/Sex: Treating RN: [birth year] (47 y.o. [name] Primary Care Provider: Dorinda Hill Other Clinician: Referring  Provider: Treating Provider/Extender: Jana Hakim in Treatment: 150 Active Problems ICD-10 Encounter Code Description Active Date MDM Diagnosis L97.512 Non-pressure chronic ulcer of other part of right foot with fat layer exposed 04/27/2023 No Yes L97.522 Non-pressure chronic ulcer of other part of left foot with fat layer exposed 09/03/2020 No Yes Inactive Problems HAGEN, MAKAREWICZ ([id number]) [id number] Page 10 of 19 ICD-10 Code Description Active Date Inactive Date L89.893 Pressure ulcer of other site, stage 3 07/15/2020 07/15/2020 M62.81 Muscle weakness (generalized) 07/15/2020 07/15/2020 I10 Essential (primary) hypertension 07/15/2020 07/15/2020 M86.171 Other acute osteomyelitis, right ankle and foot 09/03/2020 09/03/2020 Resolved Problems Electronic Signature(s) Signed: 06/01/2023 11:13:27 AM By: [name] MD FACS Entered By: [name] on 06/01/2023 08:13:26 -------------------------------------------------------------------------------- Progress Note Details Patient Name: Date of Service: [name], [name] 06/01/2023 10:15 A M Medical Record Number: [id number] Patient Account Number: [id number] Date of Birth/Sex: Treating RN: 27-Jun-1976 (47 y.o. M) Primary Care Provider: Dorinda Hill Other Clinician: Referring Provider: Treating Provider/Extender: Jana Hakim in Treatment: 150 Subjective Chief Complaint Information obtained from Patient Bilateral Plantar Foot Ulcers History of Present Illness (HPI) Wounds are12/03/2020 upon evaluation today patient presents for initial inspection here in our clinic concerning issues he has been having with the bottoms of his feet bilaterally. He states these actually occurred as wounds when he was hospitalized for 5 months secondary to Covid. He was apparently with tilting bed where he was in an upright position quite frequently and apparently this occurred in  some way shape or form during that time. Fortunately there is no sign of active infection at this time. No fevers, chills, nausea, vomiting, or diarrhea. With that being said he still has substantial wounds on the plantar aspects of his feet Theragen require quite a bit of work to get these to heal. He has been using Santyl currently though that is been problematic both in receiving the medication as well as actually paid for it as it is become quite expensive. Prior to the experience with Covid the patient really did not have any major medical problems other than hypertension he does have some mild generalized weakness following the Covid experience. 07/22/2020 on evaluation today patient appears to be doing okay in regard to his foot ulcers I feel like the wound beds are showing signs of better improvement that I do believe the Iodoflex is helping in this regard. With that being said he does have a lot of drainage currently and this is somewhat blue/green in nature which is consistent with Pseudomonas. I do think a culture today would be appropriate for [place] to evaluate and see if that is indeed the case I would likely start him on antibiotic orally as well he is not allergic to Cipro knows of no issues he has had in the past 12/21; patient was admitted to the clinic earlier this month with bilateral presumed pressure ulcers on the bottom of his feet apparently related to excessive pressure from a tilt table arrangement in the intensive care unit. Patient relates this to being on  MD FACS Signed: 06/01/2023 4:45:32 PM By: Zenaida Deed RN, BSN Previous Signature: 06/01/2023 11:16:02 AM Version By: [name] MD FACS Entered By: Zenaida Deed on 06/01/2023 08:16:17 -------------------------------------------------------------------------------- Problem List Details Patient Name: Date of Service: [name], [name] 06/01/2023 10:15 A M Medical Record Number: [id number] Patient Account Number: [id number] Date of Birth/Sex: Treating RN: [birth year] (47 y.o. [name] Primary Care Provider: Dorinda Hill Other Clinician: Referring  Provider: Treating Provider/Extender: Jana Hakim in Treatment: 150 Active Problems ICD-10 Encounter Code Description Active Date MDM Diagnosis L97.512 Non-pressure chronic ulcer of other part of right foot with fat layer exposed 04/27/2023 No Yes L97.522 Non-pressure chronic ulcer of other part of left foot with fat layer exposed 09/03/2020 No Yes Inactive Problems HAGEN, MAKAREWICZ ([id number]) [id number] Page 10 of 19 ICD-10 Code Description Active Date Inactive Date L89.893 Pressure ulcer of other site, stage 3 07/15/2020 07/15/2020 M62.81 Muscle weakness (generalized) 07/15/2020 07/15/2020 I10 Essential (primary) hypertension 07/15/2020 07/15/2020 M86.171 Other acute osteomyelitis, right ankle and foot 09/03/2020 09/03/2020 Resolved Problems Electronic Signature(s) Signed: 06/01/2023 11:13:27 AM By: [name] MD FACS Entered By: [name] on 06/01/2023 08:13:26 -------------------------------------------------------------------------------- Progress Note Details Patient Name: Date of Service: [name], [name] 06/01/2023 10:15 A M Medical Record Number: [id number] Patient Account Number: [id number] Date of Birth/Sex: Treating RN: 27-Jun-1976 (47 y.o. M) Primary Care Provider: Dorinda Hill Other Clinician: Referring Provider: Treating Provider/Extender: Jana Hakim in Treatment: 150 Subjective Chief Complaint Information obtained from Patient Bilateral Plantar Foot Ulcers History of Present Illness (HPI) Wounds are12/03/2020 upon evaluation today patient presents for initial inspection here in our clinic concerning issues he has been having with the bottoms of his feet bilaterally. He states these actually occurred as wounds when he was hospitalized for 5 months secondary to Covid. He was apparently with tilting bed where he was in an upright position quite frequently and apparently this occurred in  some way shape or form during that time. Fortunately there is no sign of active infection at this time. No fevers, chills, nausea, vomiting, or diarrhea. With that being said he still has substantial wounds on the plantar aspects of his feet Theragen require quite a bit of work to get these to heal. He has been using Santyl currently though that is been problematic both in receiving the medication as well as actually paid for it as it is become quite expensive. Prior to the experience with Covid the patient really did not have any major medical problems other than hypertension he does have some mild generalized weakness following the Covid experience. 07/22/2020 on evaluation today patient appears to be doing okay in regard to his foot ulcers I feel like the wound beds are showing signs of better improvement that I do believe the Iodoflex is helping in this regard. With that being said he does have a lot of drainage currently and this is somewhat blue/green in nature which is consistent with Pseudomonas. I do think a culture today would be appropriate for [place] to evaluate and see if that is indeed the case I would likely start him on antibiotic orally as well he is not allergic to Cipro knows of no issues he has had in the past 12/21; patient was admitted to the clinic earlier this month with bilateral presumed pressure ulcers on the bottom of his feet apparently related to excessive pressure from a tilt table arrangement in the intensive care unit. Patient relates this to being on  MD FACS Signed: 06/01/2023 4:45:32 PM By: Zenaida Deed RN, BSN Previous Signature: 06/01/2023 11:16:02 AM Version By: [name] MD FACS Entered By: Zenaida Deed on 06/01/2023 08:16:17 -------------------------------------------------------------------------------- Problem List Details Patient Name: Date of Service: [name], [name] 06/01/2023 10:15 A M Medical Record Number: [id number] Patient Account Number: [id number] Date of Birth/Sex: Treating RN: [birth year] (47 y.o. [name] Primary Care Provider: Dorinda Hill Other Clinician: Referring  Provider: Treating Provider/Extender: Jana Hakim in Treatment: 150 Active Problems ICD-10 Encounter Code Description Active Date MDM Diagnosis L97.512 Non-pressure chronic ulcer of other part of right foot with fat layer exposed 04/27/2023 No Yes L97.522 Non-pressure chronic ulcer of other part of left foot with fat layer exposed 09/03/2020 No Yes Inactive Problems HAGEN, MAKAREWICZ ([id number]) [id number] Page 10 of 19 ICD-10 Code Description Active Date Inactive Date L89.893 Pressure ulcer of other site, stage 3 07/15/2020 07/15/2020 M62.81 Muscle weakness (generalized) 07/15/2020 07/15/2020 I10 Essential (primary) hypertension 07/15/2020 07/15/2020 M86.171 Other acute osteomyelitis, right ankle and foot 09/03/2020 09/03/2020 Resolved Problems Electronic Signature(s) Signed: 06/01/2023 11:13:27 AM By: [name] MD FACS Entered By: [name] on 06/01/2023 08:13:26 -------------------------------------------------------------------------------- Progress Note Details Patient Name: Date of Service: [name], [name] 06/01/2023 10:15 A M Medical Record Number: [id number] Patient Account Number: [id number] Date of Birth/Sex: Treating RN: 27-Jun-1976 (47 y.o. M) Primary Care Provider: Dorinda Hill Other Clinician: Referring Provider: Treating Provider/Extender: Jana Hakim in Treatment: 150 Subjective Chief Complaint Information obtained from Patient Bilateral Plantar Foot Ulcers History of Present Illness (HPI) Wounds are12/03/2020 upon evaluation today patient presents for initial inspection here in our clinic concerning issues he has been having with the bottoms of his feet bilaterally. He states these actually occurred as wounds when he was hospitalized for 5 months secondary to Covid. He was apparently with tilting bed where he was in an upright position quite frequently and apparently this occurred in  some way shape or form during that time. Fortunately there is no sign of active infection at this time. No fevers, chills, nausea, vomiting, or diarrhea. With that being said he still has substantial wounds on the plantar aspects of his feet Theragen require quite a bit of work to get these to heal. He has been using Santyl currently though that is been problematic both in receiving the medication as well as actually paid for it as it is become quite expensive. Prior to the experience with Covid the patient really did not have any major medical problems other than hypertension he does have some mild generalized weakness following the Covid experience. 07/22/2020 on evaluation today patient appears to be doing okay in regard to his foot ulcers I feel like the wound beds are showing signs of better improvement that I do believe the Iodoflex is helping in this regard. With that being said he does have a lot of drainage currently and this is somewhat blue/green in nature which is consistent with Pseudomonas. I do think a culture today would be appropriate for [place] to evaluate and see if that is indeed the case I would likely start him on antibiotic orally as well he is not allergic to Cipro knows of no issues he has had in the past 12/21; patient was admitted to the clinic earlier this month with bilateral presumed pressure ulcers on the bottom of his feet apparently related to excessive pressure from a tilt table arrangement in the intensive care unit. Patient relates this to being on  History: Past Medical History Notes: COVID PNA 07/22/2019-11/14/2019 V[facility] ECMO, foot drop left foot , Eyes Medical History: Negative for: Cataracts; Glaucoma; Optic Neuritis Ear/Nose/Mouth/Throat Medical History: Negative for: Chronic sinus problems/congestion; Middle ear problems Hematologic/Lymphatic Medical History: Negative for: Anemia; Hemophilia; Human Immunodeficiency Virus; Lymphedema; Sickle Cell Disease Respiratory Medical History: Positive for: Asthma Negative for: Aspiration; Chronic Obstructive Pulmonary Disease (COPD); Pneumothorax; Sleep Apnea; Tuberculosis Cardiovascular Medical History: Positive for: Angina - with COVID; Hypertension Negative for: Arrhythmia; Congestive Heart Failure; Coronary Artery Disease; Deep Vein Thrombosis; Hypotension; Myocardial Infarction; Peripheral Arterial Disease; Peripheral Venous Disease; Phlebitis; Vasculitis Gastrointestinal Medical History: Negative for: Cirrhosis ; Colitis; Crohns; Hepatitis A; Hepatitis B; Hepatitis C Endocrine Medical History: Negative for: Type I Diabetes; Type II Diabetes Genitourinary Medical History: Negative for: End Stage Renal Disease Past Medical History Notes: kidney stone Immunological Medical History: Negative for: Lupus Erythematosus; Raynauds; Scleroderma Integumentary (Skin) Medical History: Negative for: History of Burn Musculoskeletal Medical History: Negative for: Gout; Rheumatoid Arthritis; Osteoarthritis; Osteomyelitis Neurologic Medical  History: Negative for: Dementia; Neuropathy; Quadriplegia; Paraplegia; Seizure Disorder Oncologic Medical History: Negative for: Received Chemotherapy; Received Radiation RUEL, TOSI ([id number]) [id number] Page 18 of 19 Psychiatric Medical History: Negative for: Anorexia/bulimia; Confinement Anxiety Past Medical History Notes: anxiety Immunizations Pneumococcal Vaccine: Received Pneumococcal Vaccination: No Implantable Devices None Hospitalization / Surgery History Type of Hospitalization/Surgery COVID PNA 07/22/2019- 11/14/2019 03/27/2020 wound debridement/ skin graft Family and Social History Cancer: Yes - Maternal Grandparents; Diabetes: Yes - Father,Paternal Grandparents; Heart Disease: Yes - Maternal Grandparents; Hereditary Spherocytosis: No; Hypertension: Yes - Father,Paternal Grandparents; Kidney Disease: No; Lung Disease: Yes - Siblings; Seizures: No; Stroke: No; Thyroid Problems: No; Tuberculosis: No; Never smoker; Marital Status - Married; Alcohol Use: Never; Drug Use: No History; Caffeine Use: Daily - tea, soda; Financial Concerns: No; Food, Clothing or Shelter Needs: No; Support System Lacking: No; Transportation Concerns: No Electronic Signature(s) Signed: 06/01/2023 11:18:24 AM By: [name] MD FACS Entered By: [name] on 06/01/2023 08:15:18 -------------------------------------------------------------------------------- SuperBill Details Patient Name: Date of Service: [name], [name] 06/01/2023 Medical Record Number: [id number] Patient Account Number: [id number] Date of Birth/Sex: Treating RN: 03/22/1976 (47 y.o. M) Primary Care Provider: Dorinda Hill Other Clinician: Referring Provider: Treating Provider/Extender: Jana Hakim in Treatment: 150 Diagnosis Coding ICD-10 Codes Code Description [phone] Non-pressure chronic ulcer of other part of right foot with fat layer exposed L97.522  Non-pressure chronic ulcer of other part of left foot with fat layer exposed Facility Procedures : CPT4 Code: 21308657 Description: 97597 - DEBRIDE WOUND 1ST 20 SQ CM OR < ICD-10 Diagnosis Description L97.512 Non-pressure chronic ulcer of other part of right foot with fat layer exposed L97.522 Non-pressure chronic ulcer of other part of left foot with fat layer exposed Modifier: Quantity: 1 Physician Procedures : CPT4 Code Description Modifier [id number] - WC PHYS LEVEL 4 - EST PT ICD-10 Diagnosis Description L97.512 Non-pressure chronic ulcer of other part of right foot with fat layer exposed L97.522 Non-pressure chronic ulcer of other part of left foot  with fat layer exposed Quantity: 1 : [id number] 97597 - WC PHYS DEBR WO ANESTH 20 SQ CM ICD-10 Diagnosis Description DMARCO, FILTZ ([id number]) [id number] L97.512 Non-pressure chronic ulcer of other part of right foot with fat layer exposed L97.522 Non-pressure  chronic ulcer of other part of left foot with fat layer exposed Quantity: 1 7.pdf Page 19 of 19 Electronic Signature(s) Signed: 06/01/2023 11:17:56 AM By: [name] MD FACS Entered By: [name] on 06/01/2023 08:17:56  History: Past Medical History Notes: COVID PNA 07/22/2019-11/14/2019 VENT ECMO, foot drop left foot , Eyes Medical History: Negative for: Cataracts; Glaucoma; Optic Neuritis Ear/Nose/Mouth/Throat Medical History: Negative for: Chronic sinus problems/congestion; Middle ear problems Hematologic/Lymphatic Medical History: Negative for: Anemia; Hemophilia; Human Immunodeficiency Virus; Lymphedema; Sickle Cell Disease Respiratory Medical History: Positive for: Asthma Negative for: Aspiration; Chronic Obstructive Pulmonary Disease (COPD); Pneumothorax; Sleep Apnea; Tuberculosis Cardiovascular Medical History: Positive for: Angina - with COVID; Hypertension Negative for: Arrhythmia; Congestive Heart Failure; Coronary Artery Disease; Deep Vein Thrombosis; Hypotension; Myocardial Infarction; Peripheral Arterial Disease; Peripheral Venous Disease; Phlebitis; Vasculitis Gastrointestinal Medical History: Negative for: Cirrhosis ; Colitis; Crohns; Hepatitis A; Hepatitis B; Hepatitis C Endocrine Medical History: Negative for: Type I Diabetes; Type II Diabetes Genitourinary Medical History: Negative for: End Stage Renal Disease Past Medical History Notes: kidney stone Immunological Medical History: Negative for: Lupus Erythematosus; Raynauds; Scleroderma Integumentary (Skin) Medical History: Negative for: History of Burn Musculoskeletal Medical History: Negative for: Gout; Rheumatoid Arthritis; Osteoarthritis; Osteomyelitis Neurologic Medical  History: Negative for: Dementia; Neuropathy; Quadriplegia; Paraplegia; Seizure Disorder Oncologic Medical History: Negative for: Received Chemotherapy; Received Radiation RUEL, TOSI ([id number]) [id number] Page 18 of 19 Psychiatric Medical History: Negative for: Anorexia/bulimia; Confinement Anxiety Past Medical History Notes: anxiety Immunizations Pneumococcal Vaccine: Received Pneumococcal Vaccination: No Implantable Devices None Hospitalization / Surgery History Type of Hospitalization/Surgery COVID PNA 07/22/2019- 11/14/2019 03/27/2020 wound debridement/ skin graft Family and Social History Cancer: Yes - Maternal Grandparents; Diabetes: Yes - Father,Paternal Grandparents; Heart Disease: Yes - Maternal Grandparents; Hereditary Spherocytosis: No; Hypertension: Yes - Father,Paternal Grandparents; Kidney Disease: No; Lung Disease: Yes - Siblings; Seizures: No; Stroke: No; Thyroid Problems: No; Tuberculosis: No; Never smoker; Marital Status - Married; Alcohol Use: Never; Drug Use: No History; Caffeine Use: Daily - tea, soda; Financial Concerns: No; Food, Clothing or Shelter Needs: No; Support System Lacking: No; Transportation Concerns: No Electronic Signature(s) Signed: 06/01/2023 11:18:24 AM By: [name] MD FACS Entered By: [name] on 06/01/2023 08:15:18 -------------------------------------------------------------------------------- SuperBill Details Patient Name: Date of Service: [name], [name] 06/01/2023 Medical Record Number: [id number] Patient Account Number: [id number] Date of Birth/Sex: Treating RN: 03/22/1976 (47 y.o. M) Primary Care Provider: Dorinda Hill Other Clinician: Referring Provider: Treating Provider/Extender: Jana Hakim in Treatment: 150 Diagnosis Coding ICD-10 Codes Code Description [phone] Non-pressure chronic ulcer of other part of right foot with fat layer exposed L97.522  Non-pressure chronic ulcer of other part of left foot with fat layer exposed Facility Procedures : CPT4 Code: 21308657 Description: 97597 - DEBRIDE WOUND 1ST 20 SQ CM OR < ICD-10 Diagnosis Description L97.512 Non-pressure chronic ulcer of other part of right foot with fat layer exposed L97.522 Non-pressure chronic ulcer of other part of left foot with fat layer exposed Modifier: Quantity: 1 Physician Procedures : CPT4 Code Description Modifier [id number] 99214 - WC PHYS LEVEL 4 - EST PT ICD-10 Diagnosis Description L97.512 Non-pressure chronic ulcer of other part of right foot with fat layer exposed L97.522 Non-pressure chronic ulcer of other part of left foot  with fat layer exposed Quantity: 1 : [id number] 97597 - WC PHYS DEBR WO ANESTH 20 SQ CM ICD-10 Diagnosis Description DMARCO, FILTZ ([id number]) [id number] L97.512 Non-pressure chronic ulcer of other part of right foot with fat layer exposed L97.522 Non-pressure  chronic ulcer of other part of left foot with fat layer exposed Quantity: 1 7.pdf Page 19 of 19 Electronic Signature(s) Signed: 06/01/2023 11:17:56 AM By: [name] MD FACS Entered By: [name] on 06/01/2023 08:17:56  Aug 29, 1975 (47 y.o. M) Primary Care Provider: Dorinda Hill Other Clinician: Referring Provider: Treating Provider/Extender: Jana Hakim in Treatment: 150 History of Present Illness HPI Description: Wounds are12/03/2020 upon evaluation today patient presents for initial inspection here in our clinic concerning issues he has been having with the bottoms of his feet bilaterally. He states these actually occurred as wounds when he was hospitalized for 5 months secondary to Covid. He was apparently with tilting bed where he was in an upright position quite frequently and apparently this occurred in some way shape or form during that time. Fortunately there is no sign of active infection at this time. No fevers, chills, nausea, vomiting, or diarrhea. With that being said he still has substantial wounds on the plantar aspects of his feet Theragen require quite a bit of work to get these to heal. He has been using Santyl currently though that is been problematic both in receiving the medication as well as actually paid for it as it is become quite  expensive. Prior to the experience with Covid the patient really did not have any major medical problems other than hypertension he does have some mild generalized weakness following the Covid experience. 07/22/2020 on evaluation today patient appears to be doing okay in regard to his foot ulcers I feel like the wound beds are showing signs of better improvement that I do believe the Iodoflex is helping in this regard. With that being said he does have a lot of drainage currently and this is somewhat blue/green in nature which is consistent with Pseudomonas. I do think a culture today would be appropriate for [place] to evaluate and see if that is indeed the case I would likely start him on antibiotic orally as well he is not allergic to Cipro knows of no issues he has had in the past 12/21; patient was admitted to the clinic earlier this month with bilateral presumed pressure ulcers on the bottom of his feet apparently related to excessive pressure from a tilt table arrangement in the intensive care unit. Patient relates this to being on ECMO but I am not really sure that is exactly related to that. I must say I have never seen anything like this. He has fairly extensive full-thickness wounds extending from his heel towards his midfoot mostly centered laterally. There is already been some healing distally. He does not appear to have an arterial issue. He has been using gentamicin to the wound surfaces with Iodoflex to help with ongoing debridement 1/6; this is a patient with pressure ulcers on the bottom of his feet related to excessive pressure from a standing position in the intensive care unit. He is complaining of a lot of pain in the right heel. He is not a diabetic. 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 We have been using Iodoflex to help prepare the surfaces of both wounds for an advanced treatment product. He is nonambulatory spending most of his time in a wheelchair  I have asked him not to propel the wheelchair with his heels 1/13; in general his wounds look better not much surface area change we have been using Iodoflex as of last week. I did an x-ray of the right heel as the patient was complaining of pain. I had some thoughts about a stress fracture perhaps Achilles tendon problems however what it showed was erosive changes along the inferior aspect of the calcaneus he now has a MRI booked for 1/20. 1/20; in general his  edges, but this did not appear to result in any tissue breakdown. There is a little bit of slough on the surface and minimal eschar around the edges. 03/15/2023: The wound measured narrower today. There is some slough accumulation on the surface and some eschar around the edges. 03/23/2023: The length of the wound remains stable, but it continues to narrow. The skin that has covered the posterior aspect of the calcaneus is more robust and has better tensile integrity. Minimal slough and eschar accumulation. 03/30/2023: The wound length has decreased. This seems to be primarily due to more epithelium covering the posterior aspect of the site. Minimal slough and eschar buildup. 04/07/2023: The wound measured a little bit smaller again today. There is minimal slough accumulation with just a little bit of eschar and callus around the edges. 04/13/2023: The lateral aspect of his wound is  starting to epithelialize. There is very minimal slough on the surface. Moisture control is good. 04/20/2023: Continued, albeit extremely slow, encroachment of epithelium as of the wound surface. Minimal slough. Moisture balance is good. 04/27/2023: More epithelium is filling in from the heel area. There is a little bit of slough on the surface with some periwound callus and eschar. He did note a little bit of drainage coming from his right foot and there is a small crack in the skin extending medially from the main site of his prior ulceration. 05/04/2023: The wounds are stable this week. No significant change. There is a little bit of callus buildup around the edges of each site and some thin slough on the surface on the left foot. 05/11/2023: The skin on the right heel is filling in further. There is a little bit of slough accumulation on both surfaces along with periwound callus accumulation. 05/18/2023: Both wounds are smaller today. The right foot is down to just a sliver of opening. 05/25/2023: Epithelium continues to advance from the calcaneal area towards the midfoot on the left. On the right, there is tiniest crack of an opening that remains. 06/01/2023: No significant change bilaterally. He has built up quite a bit of callus on the calcaneal surface of the right foot. Electronic Signature(s) Signed: 06/01/2023 11:15:03 AM By: [name] MD FACS Entered By: [name] on 06/01/2023 08:15:02 -------------------------------------------------------------------------------- Physical Exam Details Patient Name: Date of Service: [name], TO [name] 06/01/2023 10:15 A M Medical Record Number: [id number] Patient Account Number: [id number] Date of Birth/Sex: Treating RN: 1975-11-17 (47 y.o. M) Primary Care Provider: Dorinda Hill Other Clinician: Referring Provider: Treating Provider/Extender: Jana Hakim in Treatment: 150 Constitutional . . . . no acute  distress. Respiratory Normal work of breathing on room air.. Notes 06/01/2023: No significant change bilaterally. He has built up quite a bit of callus on the calcaneal surface of the right foot. Electronic Signature(s) Signed: 06/01/2023 11:15:40 AM By: [name] MD FACS Entered By: [name] on 06/01/2023 08:15:40 [name] ([id number]) [id number] Page 8 of 19 -------------------------------------------------------------------------------- Physician Orders Details Patient Name: Date of Service: [name] 06/01/2023 10:15 A M Medical Record Number: [id number] Patient Account Number: [id number] Date of Birth/Sex: Treating RN: September 06, 1975 (47 y.o. [name] Primary Care Provider: Dorinda Hill Other Clinician: Referring Provider: Treating Provider/Extender: Jana Hakim in Treatment: 150 The following information was scribed by: Zenaida Deed The information was scribed for: [name] Verbal / Phone Orders: No Diagnosis Coding ICD-10 Coding Code Description L97.512 Non-pressure chronic ulcer of other part of right foot with  drainage noted. The wound margin is distinct with the outline attached to the wound base. There is large (67-100%) red granulation within the wound bed. There is no necrotic tissue within the wound bed. The periwound skin appearance had no abnormalities noted for color. The periwound skin appearance exhibited: Callus, Scarring, Dry/Scaly. The periwound skin appearance did not exhibit: Rash. Periwound temperature was noted as No Abnormality. The periwound has tenderness on palpation. 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 There was a Selective/Open Wound Non-Viable Tissue Debridement with a total area of 1.88 sq cm performed by [name], MD. With the following instrument(s): Curette to remove Non-Viable tissue/material. Material removed includes [facility] after achieving pain control using Lidocaine 4% Topical Solution. No specimens were taken. A time out was conducted at 10:50, prior to the start of the procedure. A Minimum amount of bleeding was controlled with Pressure. The procedure was tolerated well with a pain level of 0 throughout and a pain level of 0 following the procedure. Post Debridement Measurements: 2.4cm length x 1cm width x 0.1cm depth; 0.188cm^3 volume. Post debridement Stage noted as Category/Stage III. Character of Wound/Ulcer Post Debridement is improved. Post procedure Diagnosis Wound #2: Same as Pre-Procedure Wound #5 Pre-procedure  diagnosis of Wound #5 is a Pressure Ulcer located on the Right,Plantar Foot . There was a Selective/Open Wound Skin/Epidermis Debridement with a total area of 1.92 sq cm performed by [name], MD. With the following instrument(s): Curette to remove Non-Viable tissue/material. Material removed includes Callus, Slough, and Skin: Epidermis after achieving pain control using Lidocaine 4% Topical Solution. No specimens were taken. A time out was conducted at 10:50, prior to the start of the procedure. A Minimum amount of bleeding was controlled with Pressure. The procedure was tolerated well with a pain level of 0 throughout and a pain level of 0 following the procedure. Post Debridement Measurements: 3.5cm length x 0.7cm width x 0.4cm depth; 0.77cm^3 volume. Post debridement Stage noted as Category/Stage II. Character of Wound/Ulcer Post Debridement is improved. Post procedure Diagnosis Wound #5: Same as Pre-Procedure Plan 06/01/2023: No significant change bilaterally. He has built up quite a bit of callus on the calcaneal surface of the right foot. I used a curette to debride slough from the left heel ulcer and slough and callus from the right. We will continue topical gentamicin and silver alginate with drawtex for moisture management and Optifoam doughnuts for offloading in addition to the peg assist offloading inserts. He has an appointment scheduled with plastic surgery next Wednesday. He is in the process of completing requirements to undergo bariatric surgery and weight loss will certainly be of benefit to him as well, and reducing the pressure on his feet. Follow-up with me a week from today. Electronic Signature(s) Signed: 06/01/2023 11:17:36 AM By: [name] MD FACS Entered By: [name] on 06/01/2023 08:17:36 -------------------------------------------------------------------------------- HxROS Details Patient Name: Date of Service: [name], [name] 06/01/2023 10:15  A M Medical Record Number: [id number] Patient Account Number: [id number] Date of Birth/Sex: Treating RN: 1976/06/17 (47 y.o. M) Primary Care Provider: Dorinda Hill Other Clinician: Referring Provider: Treating Provider/Extender: Jana Hakim in Treatment: [address] ([id number]) [id number] Page 17 of 19 Information Obtained From Patient Constitutional Symptoms (General Health) Medical  Aug 29, 1975 (47 y.o. M) Primary Care Provider: Dorinda Hill Other Clinician: Referring Provider: Treating Provider/Extender: Jana Hakim in Treatment: 150 History of Present Illness HPI Description: Wounds are12/03/2020 upon evaluation today patient presents for initial inspection here in our clinic concerning issues he has been having with the bottoms of his feet bilaterally. He states these actually occurred as wounds when he was hospitalized for 5 months secondary to Covid. He was apparently with tilting bed where he was in an upright position quite frequently and apparently this occurred in some way shape or form during that time. Fortunately there is no sign of active infection at this time. No fevers, chills, nausea, vomiting, or diarrhea. With that being said he still has substantial wounds on the plantar aspects of his feet Theragen require quite a bit of work to get these to heal. He has been using Santyl currently though that is been problematic both in receiving the medication as well as actually paid for it as it is become quite  expensive. Prior to the experience with Covid the patient really did not have any major medical problems other than hypertension he does have some mild generalized weakness following the Covid experience. 07/22/2020 on evaluation today patient appears to be doing okay in regard to his foot ulcers I feel like the wound beds are showing signs of better improvement that I do believe the Iodoflex is helping in this regard. With that being said he does have a lot of drainage currently and this is somewhat blue/green in nature which is consistent with Pseudomonas. I do think a culture today would be appropriate for [place] to evaluate and see if that is indeed the case I would likely start him on antibiotic orally as well he is not allergic to Cipro knows of no issues he has had in the past 12/21; patient was admitted to the clinic earlier this month with bilateral presumed pressure ulcers on the bottom of his feet apparently related to excessive pressure from a tilt table arrangement in the intensive care unit. Patient relates this to being on ECMO but I am not really sure that is exactly related to that. I must say I have never seen anything like this. He has fairly extensive full-thickness wounds extending from his heel towards his midfoot mostly centered laterally. There is already been some healing distally. He does not appear to have an arterial issue. He has been using gentamicin to the wound surfaces with Iodoflex to help with ongoing debridement 1/6; this is a patient with pressure ulcers on the bottom of his feet related to excessive pressure from a standing position in the intensive care unit. He is complaining of a lot of pain in the right heel. He is not a diabetic. 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 We have been using Iodoflex to help prepare the surfaces of both wounds for an advanced treatment product. He is nonambulatory spending most of his time in a wheelchair  I have asked him not to propel the wheelchair with his heels 1/13; in general his wounds look better not much surface area change we have been using Iodoflex as of last week. I did an x-ray of the right heel as the patient was complaining of pain. I had some thoughts about a stress fracture perhaps Achilles tendon problems however what it showed was erosive changes along the inferior aspect of the calcaneus he now has a MRI booked for 1/20. 1/20; in general his  History: Past Medical History Notes: COVID PNA 07/22/2019-11/14/2019 VENT ECMO, foot drop left foot , Eyes Medical History: Negative for: Cataracts; Glaucoma; Optic Neuritis Ear/Nose/Mouth/Throat Medical History: Negative for: Chronic sinus problems/congestion; Middle ear problems Hematologic/Lymphatic Medical History: Negative for: Anemia; Hemophilia; Human Immunodeficiency Virus; Lymphedema; Sickle Cell Disease Respiratory Medical History: Positive for: Asthma Negative for: Aspiration; Chronic Obstructive Pulmonary Disease (COPD); Pneumothorax; Sleep Apnea; Tuberculosis Cardiovascular Medical History: Positive for: Angina - with COVID; Hypertension Negative for: Arrhythmia; Congestive Heart Failure; Coronary Artery Disease; Deep Vein Thrombosis; Hypotension; Myocardial Infarction; Peripheral Arterial Disease; Peripheral Venous Disease; Phlebitis; Vasculitis Gastrointestinal Medical History: Negative for: Cirrhosis ; Colitis; Crohns; Hepatitis A; Hepatitis B; Hepatitis C Endocrine Medical History: Negative for: Type I Diabetes; Type II Diabetes Genitourinary Medical History: Negative for: End Stage Renal Disease Past Medical History Notes: kidney stone Immunological Medical History: Negative for: Lupus Erythematosus; Raynauds; Scleroderma Integumentary (Skin) Medical History: Negative for: History of Burn Musculoskeletal Medical History: Negative for: Gout; Rheumatoid Arthritis; Osteoarthritis; Osteomyelitis Neurologic Medical  History: Negative for: Dementia; Neuropathy; Quadriplegia; Paraplegia; Seizure Disorder Oncologic Medical History: Negative for: Received Chemotherapy; Received Radiation RUEL, TOSI ([id number]) [id number] Page 18 of 19 Psychiatric Medical History: Negative for: Anorexia/bulimia; Confinement Anxiety Past Medical History Notes: anxiety Immunizations Pneumococcal Vaccine: Received Pneumococcal Vaccination: No Implantable Devices None Hospitalization / Surgery History Type of Hospitalization/Surgery COVID PNA 07/22/2019- 11/14/2019 03/27/2020 wound debridement/ skin graft Family and Social History Cancer: Yes - Maternal Grandparents; Diabetes: Yes - Father,Paternal Grandparents; Heart Disease: Yes - Maternal Grandparents; Hereditary Spherocytosis: No; Hypertension: Yes - Father,Paternal Grandparents; Kidney Disease: No; Lung Disease: Yes - Siblings; Seizures: No; Stroke: No; Thyroid Problems: No; Tuberculosis: No; Never smoker; Marital Status - Married; Alcohol Use: Never; Drug Use: No History; Caffeine Use: Daily - tea, soda; Financial Concerns: No; Food, Clothing or Shelter Needs: No; Support System Lacking: No; Transportation Concerns: No Electronic Signature(s) Signed: 06/01/2023 11:18:24 AM By: [name] MD FACS Entered By: [name] on 06/01/2023 08:15:18 -------------------------------------------------------------------------------- SuperBill Details Patient Name: Date of Service: [name], [name] 06/01/2023 Medical Record Number: [id number] Patient Account Number: [id number] Date of Birth/Sex: Treating RN: 03/22/1976 (47 y.o. M) Primary Care Provider: Dorinda Hill Other Clinician: Referring Provider: Treating Provider/Extender: Jana Hakim in Treatment: 150 Diagnosis Coding ICD-10 Codes Code Description [phone] Non-pressure chronic ulcer of other part of right foot with fat layer exposed L97.522  Non-pressure chronic ulcer of other part of left foot with fat layer exposed Facility Procedures : CPT4 Code: [id number] Description: 97597 - DEBRIDE WOUND 1ST 20 SQ CM OR < ICD-10 Diagnosis Description L97.512 Non-pressure chronic ulcer of other part of right foot with fat layer exposed L97.522 Non-pressure chronic ulcer of other part of left foot with fat layer exposed Modifier: Quantity: 1 Physician Procedures : CPT4 Code Description Modifier [id number] - WC PHYS LEVEL 4 - EST PT ICD-10 Diagnosis Description L97.512 Non-pressure chronic ulcer of other part of right foot with fat layer exposed L97.522 Non-pressure chronic ulcer of other part of left foot  with fat layer exposed Quantity: 1 : [id number] 97597 - WC PHYS DEBR WO ANESTH 20 SQ CM ICD-10 Diagnosis Description DMARCO, FILTZ ([id number]) [id number] L97.512 Non-pressure chronic ulcer of other part of right foot with fat layer exposed L97.522 Non-pressure  chronic ulcer of other part of left foot with fat layer exposed Quantity: 1 7.pdf Page 19 of 19 Electronic Signature(s) Signed: 06/01/2023 11:17:56 AM By: [name] MD FACS Entered By: [name] on 06/01/2023 08:17:56  Aug 29, 1975 (47 y.o. M) Primary Care Provider: Dorinda Hill Other Clinician: Referring Provider: Treating Provider/Extender: Jana Hakim in Treatment: 150 History of Present Illness HPI Description: Wounds are12/03/2020 upon evaluation today patient presents for initial inspection here in our clinic concerning issues he has been having with the bottoms of his feet bilaterally. He states these actually occurred as wounds when he was hospitalized for 5 months secondary to Covid. He was apparently with tilting bed where he was in an upright position quite frequently and apparently this occurred in some way shape or form during that time. Fortunately there is no sign of active infection at this time. No fevers, chills, nausea, vomiting, or diarrhea. With that being said he still has substantial wounds on the plantar aspects of his feet Theragen require quite a bit of work to get these to heal. He has been using Santyl currently though that is been problematic both in receiving the medication as well as actually paid for it as it is become quite  expensive. Prior to the experience with Covid the patient really did not have any major medical problems other than hypertension he does have some mild generalized weakness following the Covid experience. 07/22/2020 on evaluation today patient appears to be doing okay in regard to his foot ulcers I feel like the wound beds are showing signs of better improvement that I do believe the Iodoflex is helping in this regard. With that being said he does have a lot of drainage currently and this is somewhat blue/green in nature which is consistent with Pseudomonas. I do think a culture today would be appropriate for [place] to evaluate and see if that is indeed the case I would likely start him on antibiotic orally as well he is not allergic to Cipro knows of no issues he has had in the past 12/21; patient was admitted to the clinic earlier this month with bilateral presumed pressure ulcers on the bottom of his feet apparently related to excessive pressure from a tilt table arrangement in the intensive care unit. Patient relates this to being on ECMO but I am not really sure that is exactly related to that. I must say I have never seen anything like this. He has fairly extensive full-thickness wounds extending from his heel towards his midfoot mostly centered laterally. There is already been some healing distally. He does not appear to have an arterial issue. He has been using gentamicin to the wound surfaces with Iodoflex to help with ongoing debridement 1/6; this is a patient with pressure ulcers on the bottom of his feet related to excessive pressure from a standing position in the intensive care unit. He is complaining of a lot of pain in the right heel. He is not a diabetic. 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 We have been using Iodoflex to help prepare the surfaces of both wounds for an advanced treatment product. He is nonambulatory spending most of his time in a wheelchair  I have asked him not to propel the wheelchair with his heels 1/13; in general his wounds look better not much surface area change we have been using Iodoflex as of last week. I did an x-ray of the right heel as the patient was complaining of pain. I had some thoughts about a stress fracture perhaps Achilles tendon problems however what it showed was erosive changes along the inferior aspect of the calcaneus he now has a MRI booked for 1/20. 1/20; in general his  Aug 29, 1975 (47 y.o. M) Primary Care Provider: Dorinda Hill Other Clinician: Referring Provider: Treating Provider/Extender: Jana Hakim in Treatment: 150 History of Present Illness HPI Description: Wounds are12/03/2020 upon evaluation today patient presents for initial inspection here in our clinic concerning issues he has been having with the bottoms of his feet bilaterally. He states these actually occurred as wounds when he was hospitalized for 5 months secondary to Covid. He was apparently with tilting bed where he was in an upright position quite frequently and apparently this occurred in some way shape or form during that time. Fortunately there is no sign of active infection at this time. No fevers, chills, nausea, vomiting, or diarrhea. With that being said he still has substantial wounds on the plantar aspects of his feet Theragen require quite a bit of work to get these to heal. He has been using Santyl currently though that is been problematic both in receiving the medication as well as actually paid for it as it is become quite  expensive. Prior to the experience with Covid the patient really did not have any major medical problems other than hypertension he does have some mild generalized weakness following the Covid experience. 07/22/2020 on evaluation today patient appears to be doing okay in regard to his foot ulcers I feel like the wound beds are showing signs of better improvement that I do believe the Iodoflex is helping in this regard. With that being said he does have a lot of drainage currently and this is somewhat blue/green in nature which is consistent with Pseudomonas. I do think a culture today would be appropriate for [place] to evaluate and see if that is indeed the case I would likely start him on antibiotic orally as well he is not allergic to Cipro knows of no issues he has had in the past 12/21; patient was admitted to the clinic earlier this month with bilateral presumed pressure ulcers on the bottom of his feet apparently related to excessive pressure from a tilt table arrangement in the intensive care unit. Patient relates this to being on ECMO but I am not really sure that is exactly related to that. I must say I have never seen anything like this. He has fairly extensive full-thickness wounds extending from his heel towards his midfoot mostly centered laterally. There is already been some healing distally. He does not appear to have an arterial issue. He has been using gentamicin to the wound surfaces with Iodoflex to help with ongoing debridement 1/6; this is a patient with pressure ulcers on the bottom of his feet related to excessive pressure from a standing position in the intensive care unit. He is complaining of a lot of pain in the right heel. He is not a diabetic. 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 We have been using Iodoflex to help prepare the surfaces of both wounds for an advanced treatment product. He is nonambulatory spending most of his time in a wheelchair  I have asked him not to propel the wheelchair with his heels 1/13; in general his wounds look better not much surface area change we have been using Iodoflex as of last week. I did an x-ray of the right heel as the patient was complaining of pain. I had some thoughts about a stress fracture perhaps Achilles tendon problems however what it showed was erosive changes along the inferior aspect of the calcaneus he now has a MRI booked for 1/20. 1/20; in general his  GAILARD, ROMULUS ([id number]) [id number] Page 1 of 19 Visit Report for 06/01/2023 Chief Complaint Document Details Patient Name: Date of Service: [name], TO [name] 06/01/2023 10:15 A M Medical Record Number: [id number] Patient Account Number: [id number] Date of Birth/Sex: Treating RN: November 14, 1975 (47 y.o. M) Primary Care Provider: Dorinda Hill Other Clinician: Referring Provider: Treating Provider/Extender: Jana Hakim in Treatment: 150 Information Obtained from: Patient Chief Complaint Bilateral Plantar Foot Ulcers Electronic Signature(s) Signed: 06/01/2023 11:13:42 AM By: [name] MD FACS Entered By: [name] on 06/01/2023 08:13:42 -------------------------------------------------------------------------------- Debridement Details Patient Name: Date of Service: [name], [name] 06/01/2023 10:15 A M Medical Record Number: [id number] Patient Account Number: [id number] Date of Birth/Sex: Treating RN: 08/21/1975 (48 y.o. [name] Primary Care Provider: Dorinda Hill Other Clinician: Referring Provider: Treating Provider/Extender: Jana Hakim in Treatment: 150 Debridement Performed for Assessment: Wound #2 Left Calcaneus Performed By: Physician [name], MD The following information was scribed by: Zenaida Deed The information was scribed for: [name] Debridement Type: Debridement Level of Consciousness (Pre-procedure): Awake and Alert Pre-procedure Verification/Time Out Yes - 10:50 Taken: Start Time: 10:52 Pain Control: Lidocaine 4% T opical Solution Percent of Wound Bed Debrided: 100% T Area Debrided (cm): otal 1.88 Tissue and other material debrided: Non-Viable, Slough, Slough Level: Non-Viable Tissue Debridement Description: Selective/Open Wound Instrument: Curette Bleeding: Minimum Hemostasis Achieved: Pressure Procedural Pain: 0 Post Procedural Pain:  0 Response to Treatment: Procedure was tolerated well Level of Consciousness (Post- Awake and Alert procedure): Post Debridement Measurements of Total Wound Length: (cm) 2.4 Stage: Category/Stage III Width: (cm) 1 Depth: (cm) 0.1 Volume: (cm) 0.188 Character of Wound/Ulcer Post Debridement: Improved DENO, DILKS ([id number]) [id number] Page 2 of 19 Post Procedure Diagnosis Same as Pre-procedure Electronic Signature(s) Signed: 06/01/2023 11:18:24 AM By: [name] MD FACS Signed: 06/01/2023 4:45:32 PM By: Zenaida Deed RN, BSN Entered By: Zenaida Deed on 06/01/2023 07:56:43 -------------------------------------------------------------------------------- Debridement Details Patient Name: Date of Service: [name], [name] 06/01/2023 10:15 A M Medical Record Number: [id number] Patient Account Number: [id number] Date of Birth/Sex: Treating RN: 25-Mar-1976 (47 y.o. [name] Primary Care Provider: Dorinda Hill Other Clinician: Referring Provider: Treating Provider/Extender: Jana Hakim in Treatment: 150 Debridement Performed for Assessment: Wound #5 Right,Plantar Foot Performed By: Physician [name], MD The following information was scribed by: Zenaida Deed The information was scribed for: [name] Debridement Type: Debridement Level of Consciousness (Pre-procedure): Awake and Alert Pre-procedure Verification/Time Out Yes - 10:50 Taken: Start Time: 10:52 Pain Control: Lidocaine 4% Topical Solution Percent of Wound Bed Debrided: 100% T Area Debrided (cm): otal 1.92 Tissue and other material debrided: Non-Viable, Callus, Slough, Skin: Epidermis, Slough Level: Skin/Epidermis Debridement Description: Selective/Open Wound Instrument: Curette Bleeding: Minimum Hemostasis Achieved: Pressure Procedural Pain: 0 Post Procedural Pain: 0 Response to Treatment: Procedure was tolerated  well Level of Consciousness (Post- Awake and Alert procedure): Post Debridement Measurements of Total Wound Length: (cm) 3.5 Stage: Category/Stage II Width: (cm) 0.7 Depth: (cm) 0.4 Volume: (cm) 0.77 Character of Wound/Ulcer Post Debridement: Improved Post Procedure Diagnosis Same as Pre-procedure Electronic Signature(s) Signed: 06/01/2023 11:18:24 AM By: [name] MD FACS Signed: 06/01/2023 4:45:32 PM By: Zenaida Deed RN, BSN Entered By: Zenaida Deed on 06/01/2023 08:01:40 HPI Details -------------------------------------------------------------------------------- [name] ([id number]) [id number] Page 3 of 19 Patient Name: Date of Service: [name], TO [name] 06/01/2023 10:15 A M Medical Record Number: [id number] Patient Account Number: [id number] Date of Birth/Sex: Treating RN:  History: Past Medical History Notes: COVID PNA 07/22/2019-11/14/2019 VENT ECMO, foot drop left foot , Eyes Medical History: Negative for: Cataracts; Glaucoma; Optic Neuritis Ear/Nose/Mouth/Throat Medical History: Negative for: Chronic sinus problems/congestion; Middle ear problems Hematologic/Lymphatic Medical History: Negative for: Anemia; Hemophilia; Human Immunodeficiency Virus; Lymphedema; Sickle Cell Disease Respiratory Medical History: Positive for: Asthma Negative for: Aspiration; Chronic Obstructive Pulmonary Disease (COPD); Pneumothorax; Sleep Apnea; Tuberculosis Cardiovascular Medical History: Positive for: Angina - with COVID; Hypertension Negative for: Arrhythmia; Congestive Heart Failure; Coronary Artery Disease; Deep Vein Thrombosis; Hypotension; Myocardial Infarction; Peripheral Arterial Disease; Peripheral Venous Disease; Phlebitis; Vasculitis Gastrointestinal Medical History: Negative for: Cirrhosis ; Colitis; Crohns; Hepatitis A; Hepatitis B; Hepatitis C Endocrine Medical History: Negative for: Type I Diabetes; Type II Diabetes Genitourinary Medical History: Negative for: End Stage Renal Disease Past Medical History Notes: kidney stone Immunological Medical History: Negative for: Lupus Erythematosus; Raynauds; Scleroderma Integumentary (Skin) Medical History: Negative for: History of Burn Musculoskeletal Medical History: Negative for: Gout; Rheumatoid Arthritis; Osteoarthritis; Osteomyelitis Neurologic Medical  History: Negative for: Dementia; Neuropathy; Quadriplegia; Paraplegia; Seizure Disorder Oncologic Medical History: Negative for: Received Chemotherapy; Received Radiation RUEL, TOSI ([id number]) [id number] Page 18 of 19 Psychiatric Medical History: Negative for: Anorexia/bulimia; Confinement Anxiety Past Medical History Notes: anxiety Immunizations Pneumococcal Vaccine: Received Pneumococcal Vaccination: No Implantable Devices None Hospitalization / Surgery History Type of Hospitalization/Surgery COVID PNA 07/22/2019- 11/14/2019 03/27/2020 wound debridement/ skin graft Family and Social History Cancer: Yes - Maternal Grandparents; Diabetes: Yes - Father,Paternal Grandparents; Heart Disease: Yes - Maternal Grandparents; Hereditary Spherocytosis: No; Hypertension: Yes - Father,Paternal Grandparents; Kidney Disease: No; Lung Disease: Yes - Siblings; Seizures: No; Stroke: No; Thyroid Problems: No; Tuberculosis: No; Never smoker; Marital Status - Married; Alcohol Use: Never; Drug Use: No History; Caffeine Use: Daily - tea, soda; Financial Concerns: No; Food, Clothing or Shelter Needs: No; Support System Lacking: No; Transportation Concerns: No Electronic Signature(s) Signed: 06/01/2023 11:18:24 AM By: [name] MD FACS Entered By: [name] on 06/01/2023 08:15:18 -------------------------------------------------------------------------------- SuperBill Details Patient Name: Date of Service: [name], [name] 06/01/2023 Medical Record Number: [id number] Patient Account Number: [id number] Date of Birth/Sex: Treating RN: 03/22/1976 (47 y.o. M) Primary Care Provider: Dorinda Hill Other Clinician: Referring Provider: Treating Provider/Extender: Jana Hakim in Treatment: 150 Diagnosis Coding ICD-10 Codes Code Description [phone] Non-pressure chronic ulcer of other part of right foot with fat layer exposed L97.522  Non-pressure chronic ulcer of other part of left foot with fat layer exposed Facility Procedures : CPT4 Code: 21308657 Description: 97597 - DEBRIDE WOUND 1ST 20 SQ CM OR < ICD-10 Diagnosis Description L97.512 Non-pressure chronic ulcer of other part of right foot with fat layer exposed L97.522 Non-pressure chronic ulcer of other part of left foot with fat layer exposed Modifier: Quantity: 1 Physician Procedures : CPT4 Code Description Modifier [id number] 99214 - WC PHYS LEVEL 4 - EST PT ICD-10 Diagnosis Description L97.512 Non-pressure chronic ulcer of other part of right foot with fat layer exposed L97.522 Non-pressure chronic ulcer of other part of left foot  with fat layer exposed Quantity: 1 : [id number] 97597 - WC PHYS DEBR WO ANESTH 20 SQ CM ICD-10 Diagnosis Description DMARCO, FILTZ ([id number]) [id number] L97.512 Non-pressure chronic ulcer of other part of right foot with fat layer exposed L97.522 Non-pressure  chronic ulcer of other part of left foot with fat layer exposed Quantity: 1 7.pdf Page 19 of 19 Electronic Signature(s) Signed: 06/01/2023 11:17:56 AM By: [name] MD FACS Entered By: [name] on 06/01/2023 08:17:56  History: Past Medical History Notes: COVID PNA 07/22/2019-11/14/2019 VENT ECMO, foot drop left foot , Eyes Medical History: Negative for: Cataracts; Glaucoma; Optic Neuritis Ear/Nose/Mouth/Throat Medical History: Negative for: Chronic sinus problems/congestion; Middle ear problems Hematologic/Lymphatic Medical History: Negative for: Anemia; Hemophilia; Human Immunodeficiency Virus; Lymphedema; Sickle Cell Disease Respiratory Medical History: Positive for: Asthma Negative for: Aspiration; Chronic Obstructive Pulmonary Disease (COPD); Pneumothorax; Sleep Apnea; Tuberculosis Cardiovascular Medical History: Positive for: Angina - with COVID; Hypertension Negative for: Arrhythmia; Congestive Heart Failure; Coronary Artery Disease; Deep Vein Thrombosis; Hypotension; Myocardial Infarction; Peripheral Arterial Disease; Peripheral Venous Disease; Phlebitis; Vasculitis Gastrointestinal Medical History: Negative for: Cirrhosis ; Colitis; Crohns; Hepatitis A; Hepatitis B; Hepatitis C Endocrine Medical History: Negative for: Type I Diabetes; Type II Diabetes Genitourinary Medical History: Negative for: End Stage Renal Disease Past Medical History Notes: kidney stone Immunological Medical History: Negative for: Lupus Erythematosus; Raynauds; Scleroderma Integumentary (Skin) Medical History: Negative for: History of Burn Musculoskeletal Medical History: Negative for: Gout; Rheumatoid Arthritis; Osteoarthritis; Osteomyelitis Neurologic Medical  History: Negative for: Dementia; Neuropathy; Quadriplegia; Paraplegia; Seizure Disorder Oncologic Medical History: Negative for: Received Chemotherapy; Received Radiation RUEL, TOSI ([id number]) [id number] Page 18 of 19 Psychiatric Medical History: Negative for: Anorexia/bulimia; Confinement Anxiety Past Medical History Notes: anxiety Immunizations Pneumococcal Vaccine: Received Pneumococcal Vaccination: No Implantable Devices None Hospitalization / Surgery History Type of Hospitalization/Surgery COVID PNA 07/22/2019- 11/14/2019 03/27/2020 wound debridement/ skin graft Family and Social History Cancer: Yes - Maternal Grandparents; Diabetes: Yes - Father,Paternal Grandparents; Heart Disease: Yes - Maternal Grandparents; Hereditary Spherocytosis: No; Hypertension: Yes - Father,Paternal Grandparents; Kidney Disease: No; Lung Disease: Yes - Siblings; Seizures: No; Stroke: No; Thyroid Problems: No; Tuberculosis: No; Never smoker; Marital Status - Married; Alcohol Use: Never; Drug Use: No History; Caffeine Use: Daily - tea, soda; Financial Concerns: No; Food, Clothing or Shelter Needs: No; Support System Lacking: No; Transportation Concerns: No Electronic Signature(s) Signed: 06/01/2023 11:18:24 AM By: [name] MD FACS Entered By: [name] on 06/01/2023 08:15:18 -------------------------------------------------------------------------------- SuperBill Details Patient Name: Date of Service: [name], [name] 06/01/2023 Medical Record Number: [id number] Patient Account Number: [id number] Date of Birth/Sex: Treating RN: 03/22/1976 (47 y.o. M) Primary Care Provider: Dorinda Hill Other Clinician: Referring Provider: Treating Provider/Extender: Jana Hakim in Treatment: 150 Diagnosis Coding ICD-10 Codes Code Description [phone] Non-pressure chronic ulcer of other part of right foot with fat layer exposed L97.522  Non-pressure chronic ulcer of other part of left foot with fat layer exposed Facility Procedures : CPT4 Code: 21308657 Description: 97597 - DEBRIDE WOUND 1ST 20 SQ CM OR < ICD-10 Diagnosis Description L97.512 Non-pressure chronic ulcer of other part of right foot with fat layer exposed L97.522 Non-pressure chronic ulcer of other part of left foot with fat layer exposed Modifier: Quantity: 1 Physician Procedures : CPT4 Code Description Modifier [id number] 99214 - WC PHYS LEVEL 4 - EST PT ICD-10 Diagnosis Description L97.512 Non-pressure chronic ulcer of other part of right foot with fat layer exposed L97.522 Non-pressure chronic ulcer of other part of left foot  with fat layer exposed Quantity: 1 : [id number] 97597 - WC PHYS DEBR WO ANESTH 20 SQ CM ICD-10 Diagnosis Description DMARCO, FILTZ ([id number]) [id number] L97.512 Non-pressure chronic ulcer of other part of right foot with fat layer exposed L97.522 Non-pressure  chronic ulcer of other part of left foot with fat layer exposed Quantity: 1 7.pdf Page 19 of 19 Electronic Signature(s) Signed: 06/01/2023 11:17:56 AM By: [name] MD FACS Entered By: [name] on 06/01/2023 08:17:56  drainage noted. The wound margin is distinct with the outline attached to the wound base. There is large (67-100%) red granulation within the wound bed. There is no necrotic tissue within the wound bed. The periwound skin appearance had no abnormalities noted for color. The periwound skin appearance exhibited: Callus, Scarring, Dry/Scaly. The periwound skin appearance did not exhibit: Rash. Periwound temperature was noted as No Abnormality. The periwound has tenderness on palpation. 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 There was a Selective/Open Wound Non-Viable Tissue Debridement with a total area of 1.88 sq cm performed by [name], MD. With the following instrument(s): Curette to remove Non-Viable tissue/material. Material removed includes [facility] after achieving pain control using Lidocaine 4% Topical Solution. No specimens were taken. A time out was conducted at 10:50, prior to the start of the procedure. A Minimum amount of bleeding was controlled with Pressure. The procedure was tolerated well with a pain level of 0 throughout and a pain level of 0 following the procedure. Post Debridement Measurements: 2.4cm length x 1cm width x 0.1cm depth; 0.188cm^3 volume. Post debridement Stage noted as Category/Stage III. Character of Wound/Ulcer Post Debridement is improved. Post procedure Diagnosis Wound #2: Same as Pre-Procedure Wound #5 Pre-procedure  diagnosis of Wound #5 is a Pressure Ulcer located on the Right,Plantar Foot . There was a Selective/Open Wound Skin/Epidermis Debridement with a total area of 1.92 sq cm performed by [name], MD. With the following instrument(s): Curette to remove Non-Viable tissue/material. Material removed includes Callus, Slough, and Skin: Epidermis after achieving pain control using Lidocaine 4% Topical Solution. No specimens were taken. A time out was conducted at 10:50, prior to the start of the procedure. A Minimum amount of bleeding was controlled with Pressure. The procedure was tolerated well with a pain level of 0 throughout and a pain level of 0 following the procedure. Post Debridement Measurements: 3.5cm length x 0.7cm width x 0.4cm depth; 0.77cm^3 volume. Post debridement Stage noted as Category/Stage II. Character of Wound/Ulcer Post Debridement is improved. Post procedure Diagnosis Wound #5: Same as Pre-Procedure Plan 06/01/2023: No significant change bilaterally. He has built up quite a bit of callus on the calcaneal surface of the right foot. I used a curette to debride slough from the left heel ulcer and slough and callus from the right. We will continue topical gentamicin and silver alginate with drawtex for moisture management and Optifoam doughnuts for offloading in addition to the peg assist offloading inserts. He has an appointment scheduled with plastic surgery next Wednesday. He is in the process of completing requirements to undergo bariatric surgery and weight loss will certainly be of benefit to him as well, and reducing the pressure on his feet. Follow-up with me a week from today. Electronic Signature(s) Signed: 06/01/2023 11:17:36 AM By: [name] MD FACS Entered By: [name] on 06/01/2023 08:17:36 -------------------------------------------------------------------------------- HxROS Details Patient Name: Date of Service: [name], [name] 06/01/2023 10:15  A M Medical Record Number: [id number] Patient Account Number: [id number] Date of Birth/Sex: Treating RN: 1976/06/17 (47 y.o. M) Primary Care Provider: Dorinda Hill Other Clinician: Referring Provider: Treating Provider/Extender: Jana Hakim in Treatment: [address] ([id number]) [id number] Page 17 of 19 Information Obtained From Patient Constitutional Symptoms (General Health) Medical  Aug 29, 1975 (47 y.o. M) Primary Care Provider: Dorinda Hill Other Clinician: Referring Provider: Treating Provider/Extender: Jana Hakim in Treatment: 150 History of Present Illness HPI Description: Wounds are12/03/2020 upon evaluation today patient presents for initial inspection here in our clinic concerning issues he has been having with the bottoms of his feet bilaterally. He states these actually occurred as wounds when he was hospitalized for 5 months secondary to Covid. He was apparently with tilting bed where he was in an upright position quite frequently and apparently this occurred in some way shape or form during that time. Fortunately there is no sign of active infection at this time. No fevers, chills, nausea, vomiting, or diarrhea. With that being said he still has substantial wounds on the plantar aspects of his feet Theragen require quite a bit of work to get these to heal. He has been using Santyl currently though that is been problematic both in receiving the medication as well as actually paid for it as it is become quite  expensive. Prior to the experience with Covid the patient really did not have any major medical problems other than hypertension he does have some mild generalized weakness following the Covid experience. 07/22/2020 on evaluation today patient appears to be doing okay in regard to his foot ulcers I feel like the wound beds are showing signs of better improvement that I do believe the Iodoflex is helping in this regard. With that being said he does have a lot of drainage currently and this is somewhat blue/green in nature which is consistent with Pseudomonas. I do think a culture today would be appropriate for [place] to evaluate and see if that is indeed the case I would likely start him on antibiotic orally as well he is not allergic to Cipro knows of no issues he has had in the past 12/21; patient was admitted to the clinic earlier this month with bilateral presumed pressure ulcers on the bottom of his feet apparently related to excessive pressure from a tilt table arrangement in the intensive care unit. Patient relates this to being on ECMO but I am not really sure that is exactly related to that. I must say I have never seen anything like this. He has fairly extensive full-thickness wounds extending from his heel towards his midfoot mostly centered laterally. There is already been some healing distally. He does not appear to have an arterial issue. He has been using gentamicin to the wound surfaces with Iodoflex to help with ongoing debridement 1/6; this is a patient with pressure ulcers on the bottom of his feet related to excessive pressure from a standing position in the intensive care unit. He is complaining of a lot of pain in the right heel. He is not a diabetic. 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 We have been using Iodoflex to help prepare the surfaces of both wounds for an advanced treatment product. He is nonambulatory spending most of his time in a wheelchair  I have asked him not to propel the wheelchair with his heels 1/13; in general his wounds look better not much surface area change we have been using Iodoflex as of last week. I did an x-ray of the right heel as the patient was complaining of pain. I had some thoughts about a stress fracture perhaps Achilles tendon problems however what it showed was erosive changes along the inferior aspect of the calcaneus he now has a MRI booked for 1/20. 1/20; in general his  MD FACS Signed: 06/01/2023 4:45:32 PM By: Zenaida Deed RN, BSN Previous Signature: 06/01/2023 11:16:02 AM Version By: [name] MD FACS Entered By: Zenaida Deed on 06/01/2023 08:16:17 -------------------------------------------------------------------------------- Problem List Details Patient Name: Date of Service: [name], [name] 06/01/2023 10:15 A M Medical Record Number: [id number] Patient Account Number: [id number] Date of Birth/Sex: Treating RN: [birth year] (47 y.o. [name] Primary Care Provider: Dorinda Hill Other Clinician: Referring  Provider: Treating Provider/Extender: Jana Hakim in Treatment: 150 Active Problems ICD-10 Encounter Code Description Active Date MDM Diagnosis L97.512 Non-pressure chronic ulcer of other part of right foot with fat layer exposed 04/27/2023 No Yes L97.522 Non-pressure chronic ulcer of other part of left foot with fat layer exposed 09/03/2020 No Yes Inactive Problems HAGEN, MAKAREWICZ ([id number]) [id number] Page 10 of 19 ICD-10 Code Description Active Date Inactive Date L89.893 Pressure ulcer of other site, stage 3 07/15/2020 07/15/2020 M62.81 Muscle weakness (generalized) 07/15/2020 07/15/2020 I10 Essential (primary) hypertension 07/15/2020 07/15/2020 M86.171 Other acute osteomyelitis, right ankle and foot 09/03/2020 09/03/2020 Resolved Problems Electronic Signature(s) Signed: 06/01/2023 11:13:27 AM By: [name] MD FACS Entered By: [name] on 06/01/2023 08:13:26 -------------------------------------------------------------------------------- Progress Note Details Patient Name: Date of Service: [name], [name] 06/01/2023 10:15 A M Medical Record Number: [id number] Patient Account Number: [id number] Date of Birth/Sex: Treating RN: 27-Jun-1976 (47 y.o. M) Primary Care Provider: Dorinda Hill Other Clinician: Referring Provider: Treating Provider/Extender: Jana Hakim in Treatment: 150 Subjective Chief Complaint Information obtained from Patient Bilateral Plantar Foot Ulcers History of Present Illness (HPI) Wounds are12/03/2020 upon evaluation today patient presents for initial inspection here in our clinic concerning issues he has been having with the bottoms of his feet bilaterally. He states these actually occurred as wounds when he was hospitalized for 5 months secondary to Covid. He was apparently with tilting bed where he was in an upright position quite frequently and apparently this occurred in  some way shape or form during that time. Fortunately there is no sign of active infection at this time. No fevers, chills, nausea, vomiting, or diarrhea. With that being said he still has substantial wounds on the plantar aspects of his feet Theragen require quite a bit of work to get these to heal. He has been using Santyl currently though that is been problematic both in receiving the medication as well as actually paid for it as it is become quite expensive. Prior to the experience with Covid the patient really did not have any major medical problems other than hypertension he does have some mild generalized weakness following the Covid experience. 07/22/2020 on evaluation today patient appears to be doing okay in regard to his foot ulcers I feel like the wound beds are showing signs of better improvement that I do believe the Iodoflex is helping in this regard. With that being said he does have a lot of drainage currently and this is somewhat blue/green in nature which is consistent with Pseudomonas. I do think a culture today would be appropriate for [place] to evaluate and see if that is indeed the case I would likely start him on antibiotic orally as well he is not allergic to Cipro knows of no issues he has had in the past 12/21; patient was admitted to the clinic earlier this month with bilateral presumed pressure ulcers on the bottom of his feet apparently related to excessive pressure from a tilt table arrangement in the intensive care unit. Patient relates this to being on  edges, but this did not appear to result in any tissue breakdown. There is a little bit of slough on the surface and minimal eschar around the edges. 03/15/2023: The wound measured narrower today. There is some slough accumulation on the surface and some eschar around the edges. 03/23/2023: The length of the wound remains stable, but it continues to narrow. The skin that has covered the posterior aspect of the calcaneus is more robust and has better tensile integrity. Minimal slough and eschar accumulation. 03/30/2023: The wound length has decreased. This seems to be primarily due to more epithelium covering the posterior aspect of the site. Minimal slough and eschar buildup. 04/07/2023: The wound measured a little bit smaller again today. There is minimal slough accumulation with just a little bit of eschar and callus around the edges. 04/13/2023: The lateral aspect of his wound is  starting to epithelialize. There is very minimal slough on the surface. Moisture control is good. 04/20/2023: Continued, albeit extremely slow, encroachment of epithelium as of the wound surface. Minimal slough. Moisture balance is good. 04/27/2023: More epithelium is filling in from the heel area. There is a little bit of slough on the surface with some periwound callus and eschar. He did note a little bit of drainage coming from his right foot and there is a small crack in the skin extending medially from the main site of his prior ulceration. 05/04/2023: The wounds are stable this week. No significant change. There is a little bit of callus buildup around the edges of each site and some thin slough on the surface on the left foot. 05/11/2023: The skin on the right heel is filling in further. There is a little bit of slough accumulation on both surfaces along with periwound callus accumulation. 05/18/2023: Both wounds are smaller today. The right foot is down to just a sliver of opening. 05/25/2023: Epithelium continues to advance from the calcaneal area towards the midfoot on the left. On the right, there is tiniest crack of an opening that remains. 06/01/2023: No significant change bilaterally. He has built up quite a bit of callus on the calcaneal surface of the right foot. Electronic Signature(s) Signed: 06/01/2023 11:15:03 AM By: [name] MD FACS Entered By: [name] on 06/01/2023 08:15:02 -------------------------------------------------------------------------------- Physical Exam Details Patient Name: Date of Service: [name], TO [name] 06/01/2023 10:15 A M Medical Record Number: [id number] Patient Account Number: [id number] Date of Birth/Sex: Treating RN: 1975-11-17 (47 y.o. M) Primary Care Provider: Dorinda Hill Other Clinician: Referring Provider: Treating Provider/Extender: Jana Hakim in Treatment: 150 Constitutional . . . . no acute  distress. Respiratory Normal work of breathing on room air.. Notes 06/01/2023: No significant change bilaterally. He has built up quite a bit of callus on the calcaneal surface of the right foot. Electronic Signature(s) Signed: 06/01/2023 11:15:40 AM By: [name] MD FACS Entered By: [name] on 06/01/2023 08:15:40 [name] ([id number]) [id number] Page 8 of 19 -------------------------------------------------------------------------------- Physician Orders Details Patient Name: Date of Service: [name] 06/01/2023 10:15 A M Medical Record Number: [id number] Patient Account Number: [id number] Date of Birth/Sex: Treating RN: September 06, 1975 (47 y.o. [name] Primary Care Provider: Dorinda Hill Other Clinician: Referring Provider: Treating Provider/Extender: Jana Hakim in Treatment: 150 The following information was scribed by: Zenaida Deed The information was scribed for: [name] Verbal / Phone Orders: No Diagnosis Coding ICD-10 Coding Code Description L97.512 Non-pressure chronic ulcer of other part of right foot with  Aug 29, 1975 (47 y.o. M) Primary Care Provider: Dorinda Hill Other Clinician: Referring Provider: Treating Provider/Extender: Jana Hakim in Treatment: 150 History of Present Illness HPI Description: Wounds are12/03/2020 upon evaluation today patient presents for initial inspection here in our clinic concerning issues he has been having with the bottoms of his feet bilaterally. He states these actually occurred as wounds when he was hospitalized for 5 months secondary to Covid. He was apparently with tilting bed where he was in an upright position quite frequently and apparently this occurred in some way shape or form during that time. Fortunately there is no sign of active infection at this time. No fevers, chills, nausea, vomiting, or diarrhea. With that being said he still has substantial wounds on the plantar aspects of his feet Theragen require quite a bit of work to get these to heal. He has been using Santyl currently though that is been problematic both in receiving the medication as well as actually paid for it as it is become quite  expensive. Prior to the experience with Covid the patient really did not have any major medical problems other than hypertension he does have some mild generalized weakness following the Covid experience. 07/22/2020 on evaluation today patient appears to be doing okay in regard to his foot ulcers I feel like the wound beds are showing signs of better improvement that I do believe the Iodoflex is helping in this regard. With that being said he does have a lot of drainage currently and this is somewhat blue/green in nature which is consistent with Pseudomonas. I do think a culture today would be appropriate for [place] to evaluate and see if that is indeed the case I would likely start him on antibiotic orally as well he is not allergic to Cipro knows of no issues he has had in the past 12/21; patient was admitted to the clinic earlier this month with bilateral presumed pressure ulcers on the bottom of his feet apparently related to excessive pressure from a tilt table arrangement in the intensive care unit. Patient relates this to being on ECMO but I am not really sure that is exactly related to that. I must say I have never seen anything like this. He has fairly extensive full-thickness wounds extending from his heel towards his midfoot mostly centered laterally. There is already been some healing distally. He does not appear to have an arterial issue. He has been using gentamicin to the wound surfaces with Iodoflex to help with ongoing debridement 1/6; this is a patient with pressure ulcers on the bottom of his feet related to excessive pressure from a standing position in the intensive care unit. He is complaining of a lot of pain in the right heel. He is not a diabetic. 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 We have been using Iodoflex to help prepare the surfaces of both wounds for an advanced treatment product. He is nonambulatory spending most of his time in a wheelchair  I have asked him not to propel the wheelchair with his heels 1/13; in general his wounds look better not much surface area change we have been using Iodoflex as of last week. I did an x-ray of the right heel as the patient was complaining of pain. I had some thoughts about a stress fracture perhaps Achilles tendon problems however what it showed was erosive changes along the inferior aspect of the calcaneus he now has a MRI booked for 1/20. 1/20; in general his  MD FACS Signed: 06/01/2023 4:45:32 PM By: Zenaida Deed RN, BSN Previous Signature: 06/01/2023 11:16:02 AM Version By: [name] MD FACS Entered By: Zenaida Deed on 06/01/2023 08:16:17 -------------------------------------------------------------------------------- Problem List Details Patient Name: Date of Service: [name], [name] 06/01/2023 10:15 A M Medical Record Number: [id number] Patient Account Number: [id number] Date of Birth/Sex: Treating RN: [birth year] (47 y.o. [name] Primary Care Provider: Dorinda Hill Other Clinician: Referring  Provider: Treating Provider/Extender: Jana Hakim in Treatment: 150 Active Problems ICD-10 Encounter Code Description Active Date MDM Diagnosis L97.512 Non-pressure chronic ulcer of other part of right foot with fat layer exposed 04/27/2023 No Yes L97.522 Non-pressure chronic ulcer of other part of left foot with fat layer exposed 09/03/2020 No Yes Inactive Problems HAGEN, MAKAREWICZ ([id number]) [id number] Page 10 of 19 ICD-10 Code Description Active Date Inactive Date L89.893 Pressure ulcer of other site, stage 3 07/15/2020 07/15/2020 M62.81 Muscle weakness (generalized) 07/15/2020 07/15/2020 I10 Essential (primary) hypertension 07/15/2020 07/15/2020 M86.171 Other acute osteomyelitis, right ankle and foot 09/03/2020 09/03/2020 Resolved Problems Electronic Signature(s) Signed: 06/01/2023 11:13:27 AM By: [name] MD FACS Entered By: [name] on 06/01/2023 08:13:26 -------------------------------------------------------------------------------- Progress Note Details Patient Name: Date of Service: [name], [name] 06/01/2023 10:15 A M Medical Record Number: [id number] Patient Account Number: [id number] Date of Birth/Sex: Treating RN: 27-Jun-1976 (47 y.o. M) Primary Care Provider: Dorinda Hill Other Clinician: Referring Provider: Treating Provider/Extender: Jana Hakim in Treatment: 150 Subjective Chief Complaint Information obtained from Patient Bilateral Plantar Foot Ulcers History of Present Illness (HPI) Wounds are12/03/2020 upon evaluation today patient presents for initial inspection here in our clinic concerning issues he has been having with the bottoms of his feet bilaterally. He states these actually occurred as wounds when he was hospitalized for 5 months secondary to Covid. He was apparently with tilting bed where he was in an upright position quite frequently and apparently this occurred in  some way shape or form during that time. Fortunately there is no sign of active infection at this time. No fevers, chills, nausea, vomiting, or diarrhea. With that being said he still has substantial wounds on the plantar aspects of his feet Theragen require quite a bit of work to get these to heal. He has been using Santyl currently though that is been problematic both in receiving the medication as well as actually paid for it as it is become quite expensive. Prior to the experience with Covid the patient really did not have any major medical problems other than hypertension he does have some mild generalized weakness following the Covid experience. 07/22/2020 on evaluation today patient appears to be doing okay in regard to his foot ulcers I feel like the wound beds are showing signs of better improvement that I do believe the Iodoflex is helping in this regard. With that being said he does have a lot of drainage currently and this is somewhat blue/green in nature which is consistent with Pseudomonas. I do think a culture today would be appropriate for [place] to evaluate and see if that is indeed the case I would likely start him on antibiotic orally as well he is not allergic to Cipro knows of no issues he has had in the past 12/21; patient was admitted to the clinic earlier this month with bilateral presumed pressure ulcers on the bottom of his feet apparently related to excessive pressure from a tilt table arrangement in the intensive care unit. Patient relates this to being on  MD FACS Signed: 06/01/2023 4:45:32 PM By: Zenaida Deed RN, BSN Previous Signature: 06/01/2023 11:16:02 AM Version By: [name] MD FACS Entered By: Zenaida Deed on 06/01/2023 08:16:17 -------------------------------------------------------------------------------- Problem List Details Patient Name: Date of Service: [name], [name] 06/01/2023 10:15 A M Medical Record Number: [id number] Patient Account Number: [id number] Date of Birth/Sex: Treating RN: [birth year] (47 y.o. [name] Primary Care Provider: Dorinda Hill Other Clinician: Referring  Provider: Treating Provider/Extender: Jana Hakim in Treatment: 150 Active Problems ICD-10 Encounter Code Description Active Date MDM Diagnosis L97.512 Non-pressure chronic ulcer of other part of right foot with fat layer exposed 04/27/2023 No Yes L97.522 Non-pressure chronic ulcer of other part of left foot with fat layer exposed 09/03/2020 No Yes Inactive Problems HAGEN, MAKAREWICZ ([id number]) [id number] Page 10 of 19 ICD-10 Code Description Active Date Inactive Date L89.893 Pressure ulcer of other site, stage 3 07/15/2020 07/15/2020 M62.81 Muscle weakness (generalized) 07/15/2020 07/15/2020 I10 Essential (primary) hypertension 07/15/2020 07/15/2020 M86.171 Other acute osteomyelitis, right ankle and foot 09/03/2020 09/03/2020 Resolved Problems Electronic Signature(s) Signed: 06/01/2023 11:13:27 AM By: [name] MD FACS Entered By: [name] on 06/01/2023 08:13:26 -------------------------------------------------------------------------------- Progress Note Details Patient Name: Date of Service: [name], [name] 06/01/2023 10:15 A M Medical Record Number: [id number] Patient Account Number: [id number] Date of Birth/Sex: Treating RN: 27-Jun-1976 (47 y.o. M) Primary Care Provider: Dorinda Hill Other Clinician: Referring Provider: Treating Provider/Extender: Jana Hakim in Treatment: 150 Subjective Chief Complaint Information obtained from Patient Bilateral Plantar Foot Ulcers History of Present Illness (HPI) Wounds are12/03/2020 upon evaluation today patient presents for initial inspection here in our clinic concerning issues he has been having with the bottoms of his feet bilaterally. He states these actually occurred as wounds when he was hospitalized for 5 months secondary to Covid. He was apparently with tilting bed where he was in an upright position quite frequently and apparently this occurred in  some way shape or form during that time. Fortunately there is no sign of active infection at this time. No fevers, chills, nausea, vomiting, or diarrhea. With that being said he still has substantial wounds on the plantar aspects of his feet Theragen require quite a bit of work to get these to heal. He has been using Santyl currently though that is been problematic both in receiving the medication as well as actually paid for it as it is become quite expensive. Prior to the experience with Covid the patient really did not have any major medical problems other than hypertension he does have some mild generalized weakness following the Covid experience. 07/22/2020 on evaluation today patient appears to be doing okay in regard to his foot ulcers I feel like the wound beds are showing signs of better improvement that I do believe the Iodoflex is helping in this regard. With that being said he does have a lot of drainage currently and this is somewhat blue/green in nature which is consistent with Pseudomonas. I do think a culture today would be appropriate for [place] to evaluate and see if that is indeed the case I would likely start him on antibiotic orally as well he is not allergic to Cipro knows of no issues he has had in the past 12/21; patient was admitted to the clinic earlier this month with bilateral presumed pressure ulcers on the bottom of his feet apparently related to excessive pressure from a tilt table arrangement in the intensive care unit. Patient relates this to being on  GAILARD, ROMULUS ([id number]) [id number] Page 1 of 19 Visit Report for 06/01/2023 Chief Complaint Document Details Patient Name: Date of Service: [name], TO [name] 06/01/2023 10:15 A M Medical Record Number: [id number] Patient Account Number: [id number] Date of Birth/Sex: Treating RN: November 14, 1975 (47 y.o. M) Primary Care Provider: Dorinda Hill Other Clinician: Referring Provider: Treating Provider/Extender: Jana Hakim in Treatment: 150 Information Obtained from: Patient Chief Complaint Bilateral Plantar Foot Ulcers Electronic Signature(s) Signed: 06/01/2023 11:13:42 AM By: [name] MD FACS Entered By: [name] on 06/01/2023 08:13:42 -------------------------------------------------------------------------------- Debridement Details Patient Name: Date of Service: [name], [name] 06/01/2023 10:15 A M Medical Record Number: [id number] Patient Account Number: [id number] Date of Birth/Sex: Treating RN: 08/21/1975 (48 y.o. [name] Primary Care Provider: Dorinda Hill Other Clinician: Referring Provider: Treating Provider/Extender: Jana Hakim in Treatment: 150 Debridement Performed for Assessment: Wound #2 Left Calcaneus Performed By: Physician [name], MD The following information was scribed by: Zenaida Deed The information was scribed for: [name] Debridement Type: Debridement Level of Consciousness (Pre-procedure): Awake and Alert Pre-procedure Verification/Time Out Yes - 10:50 Taken: Start Time: 10:52 Pain Control: Lidocaine 4% T opical Solution Percent of Wound Bed Debrided: 100% T Area Debrided (cm): otal 1.88 Tissue and other material debrided: Non-Viable, Slough, Slough Level: Non-Viable Tissue Debridement Description: Selective/Open Wound Instrument: Curette Bleeding: Minimum Hemostasis Achieved: Pressure Procedural Pain: 0 Post Procedural Pain:  0 Response to Treatment: Procedure was tolerated well Level of Consciousness (Post- Awake and Alert procedure): Post Debridement Measurements of Total Wound Length: (cm) 2.4 Stage: Category/Stage III Width: (cm) 1 Depth: (cm) 0.1 Volume: (cm) 0.188 Character of Wound/Ulcer Post Debridement: Improved DENO, DILKS ([id number]) [id number] Page 2 of 19 Post Procedure Diagnosis Same as Pre-procedure Electronic Signature(s) Signed: 06/01/2023 11:18:24 AM By: [name] MD FACS Signed: 06/01/2023 4:45:32 PM By: Zenaida Deed RN, BSN Entered By: Zenaida Deed on 06/01/2023 07:56:43 -------------------------------------------------------------------------------- Debridement Details Patient Name: Date of Service: [name], [name] 06/01/2023 10:15 A M Medical Record Number: [id number] Patient Account Number: [id number] Date of Birth/Sex: Treating RN: 25-Mar-1976 (47 y.o. [name] Primary Care Provider: Dorinda Hill Other Clinician: Referring Provider: Treating Provider/Extender: Jana Hakim in Treatment: 150 Debridement Performed for Assessment: Wound #5 Right,Plantar Foot Performed By: Physician [name], MD The following information was scribed by: Zenaida Deed The information was scribed for: [name] Debridement Type: Debridement Level of Consciousness (Pre-procedure): Awake and Alert Pre-procedure Verification/Time Out Yes - 10:50 Taken: Start Time: 10:52 Pain Control: Lidocaine 4% Topical Solution Percent of Wound Bed Debrided: 100% T Area Debrided (cm): otal 1.92 Tissue and other material debrided: Non-Viable, Callus, Slough, Skin: Epidermis, Slough Level: Skin/Epidermis Debridement Description: Selective/Open Wound Instrument: Curette Bleeding: Minimum Hemostasis Achieved: Pressure Procedural Pain: 0 Post Procedural Pain: 0 Response to Treatment: Procedure was tolerated  well Level of Consciousness (Post- Awake and Alert procedure): Post Debridement Measurements of Total Wound Length: (cm) 3.5 Stage: Category/Stage II Width: (cm) 0.7 Depth: (cm) 0.4 Volume: (cm) 0.77 Character of Wound/Ulcer Post Debridement: Improved Post Procedure Diagnosis Same as Pre-procedure Electronic Signature(s) Signed: 06/01/2023 11:18:24 AM By: [name] MD FACS Signed: 06/01/2023 4:45:32 PM By: Zenaida Deed RN, BSN Entered By: Zenaida Deed on 06/01/2023 08:01:40 HPI Details -------------------------------------------------------------------------------- [name] ([id number]) [id number] Page 3 of 19 Patient Name: Date of Service: [name], TO [name] 06/01/2023 10:15 A M Medical Record Number: [id number] Patient Account Number: [id number] Date of Birth/Sex: Treating RN:  Aug 29, 1975 (47 y.o. M) Primary Care Provider: Dorinda Hill Other Clinician: Referring Provider: Treating Provider/Extender: Jana Hakim in Treatment: 150 History of Present Illness HPI Description: Wounds are12/03/2020 upon evaluation today patient presents for initial inspection here in our clinic concerning issues he has been having with the bottoms of his feet bilaterally. He states these actually occurred as wounds when he was hospitalized for 5 months secondary to Covid. He was apparently with tilting bed where he was in an upright position quite frequently and apparently this occurred in some way shape or form during that time. Fortunately there is no sign of active infection at this time. No fevers, chills, nausea, vomiting, or diarrhea. With that being said he still has substantial wounds on the plantar aspects of his feet Theragen require quite a bit of work to get these to heal. He has been using Santyl currently though that is been problematic both in receiving the medication as well as actually paid for it as it is become quite  expensive. Prior to the experience with Covid the patient really did not have any major medical problems other than hypertension he does have some mild generalized weakness following the Covid experience. 07/22/2020 on evaluation today patient appears to be doing okay in regard to his foot ulcers I feel like the wound beds are showing signs of better improvement that I do believe the Iodoflex is helping in this regard. With that being said he does have a lot of drainage currently and this is somewhat blue/green in nature which is consistent with Pseudomonas. I do think a culture today would be appropriate for [place] to evaluate and see if that is indeed the case I would likely start him on antibiotic orally as well he is not allergic to Cipro knows of no issues he has had in the past 12/21; patient was admitted to the clinic earlier this month with bilateral presumed pressure ulcers on the bottom of his feet apparently related to excessive pressure from a tilt table arrangement in the intensive care unit. Patient relates this to being on ECMO but I am not really sure that is exactly related to that. I must say I have never seen anything like this. He has fairly extensive full-thickness wounds extending from his heel towards his midfoot mostly centered laterally. There is already been some healing distally. He does not appear to have an arterial issue. He has been using gentamicin to the wound surfaces with Iodoflex to help with ongoing debridement 1/6; this is a patient with pressure ulcers on the bottom of his feet related to excessive pressure from a standing position in the intensive care unit. He is complaining of a lot of pain in the right heel. He is not a diabetic. 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 We have been using Iodoflex to help prepare the surfaces of both wounds for an advanced treatment product. He is nonambulatory spending most of his time in a wheelchair  I have asked him not to propel the wheelchair with his heels 1/13; in general his wounds look better not much surface area change we have been using Iodoflex as of last week. I did an x-ray of the right heel as the patient was complaining of pain. I had some thoughts about a stress fracture perhaps Achilles tendon problems however what it showed was erosive changes along the inferior aspect of the calcaneus he now has a MRI booked for 1/20. 1/20; in general his  GAILARD, ROMULUS ([id number]) [id number] Page 1 of 19 Visit Report for 06/01/2023 Chief Complaint Document Details Patient Name: Date of Service: [name], TO [name] 06/01/2023 10:15 A M Medical Record Number: [id number] Patient Account Number: [id number] Date of Birth/Sex: Treating RN: November 14, 1975 (47 y.o. M) Primary Care Provider: Dorinda Hill Other Clinician: Referring Provider: Treating Provider/Extender: Jana Hakim in Treatment: 150 Information Obtained from: Patient Chief Complaint Bilateral Plantar Foot Ulcers Electronic Signature(s) Signed: 06/01/2023 11:13:42 AM By: [name] MD FACS Entered By: [name] on 06/01/2023 08:13:42 -------------------------------------------------------------------------------- Debridement Details Patient Name: Date of Service: [name], [name] 06/01/2023 10:15 A M Medical Record Number: [id number] Patient Account Number: [id number] Date of Birth/Sex: Treating RN: 08/21/1975 (48 y.o. [name] Primary Care Provider: Dorinda Hill Other Clinician: Referring Provider: Treating Provider/Extender: Jana Hakim in Treatment: 150 Debridement Performed for Assessment: Wound #2 Left Calcaneus Performed By: Physician [name], MD The following information was scribed by: Zenaida Deed The information was scribed for: [name] Debridement Type: Debridement Level of Consciousness (Pre-procedure): Awake and Alert Pre-procedure Verification/Time Out Yes - 10:50 Taken: Start Time: 10:52 Pain Control: Lidocaine 4% T opical Solution Percent of Wound Bed Debrided: 100% T Area Debrided (cm): otal 1.88 Tissue and other material debrided: Non-Viable, Slough, Slough Level: Non-Viable Tissue Debridement Description: Selective/Open Wound Instrument: Curette Bleeding: Minimum Hemostasis Achieved: Pressure Procedural Pain: 0 Post Procedural Pain:  0 Response to Treatment: Procedure was tolerated well Level of Consciousness (Post- Awake and Alert procedure): Post Debridement Measurements of Total Wound Length: (cm) 2.4 Stage: Category/Stage III Width: (cm) 1 Depth: (cm) 0.1 Volume: (cm) 0.188 Character of Wound/Ulcer Post Debridement: Improved DENO, DILKS ([id number]) [id number] Page 2 of 19 Post Procedure Diagnosis Same as Pre-procedure Electronic Signature(s) Signed: 06/01/2023 11:18:24 AM By: [name] MD FACS Signed: 06/01/2023 4:45:32 PM By: Zenaida Deed RN, BSN Entered By: Zenaida Deed on 06/01/2023 07:56:43 -------------------------------------------------------------------------------- Debridement Details Patient Name: Date of Service: [name], [name] 06/01/2023 10:15 A M Medical Record Number: [id number] Patient Account Number: [id number] Date of Birth/Sex: Treating RN: 25-Mar-1976 (47 y.o. [name] Primary Care Provider: Dorinda Hill Other Clinician: Referring Provider: Treating Provider/Extender: Jana Hakim in Treatment: 150 Debridement Performed for Assessment: Wound #5 Right,Plantar Foot Performed By: Physician [name], MD The following information was scribed by: Zenaida Deed The information was scribed for: [name] Debridement Type: Debridement Level of Consciousness (Pre-procedure): Awake and Alert Pre-procedure Verification/Time Out Yes - 10:50 Taken: Start Time: 10:52 Pain Control: Lidocaine 4% Topical Solution Percent of Wound Bed Debrided: 100% T Area Debrided (cm): otal 1.92 Tissue and other material debrided: Non-Viable, Callus, Slough, Skin: Epidermis, Slough Level: Skin/Epidermis Debridement Description: Selective/Open Wound Instrument: Curette Bleeding: Minimum Hemostasis Achieved: Pressure Procedural Pain: 0 Post Procedural Pain: 0 Response to Treatment: Procedure was tolerated  well Level of Consciousness (Post- Awake and Alert procedure): Post Debridement Measurements of Total Wound Length: (cm) 3.5 Stage: Category/Stage II Width: (cm) 0.7 Depth: (cm) 0.4 Volume: (cm) 0.77 Character of Wound/Ulcer Post Debridement: Improved Post Procedure Diagnosis Same as Pre-procedure Electronic Signature(s) Signed: 06/01/2023 11:18:24 AM By: [name] MD FACS Signed: 06/01/2023 4:45:32 PM By: Zenaida Deed RN, BSN Entered By: Zenaida Deed on 06/01/2023 08:01:40 HPI Details -------------------------------------------------------------------------------- [name] ([id number]) [id number] Page 3 of 19 Patient Name: Date of Service: [name], TO [name] 06/01/2023 10:15 A M Medical Record Number: [id number] Patient Account Number: [id number] Date of Birth/Sex: Treating RN:  edges, but this did not appear to result in any tissue breakdown. There is a little bit of slough on the surface and minimal eschar around the edges. 03/15/2023: The wound measured narrower today. There is some slough accumulation on the surface and some eschar around the edges. 03/23/2023: The length of the wound remains stable, but it continues to narrow. The skin that has covered the posterior aspect of the calcaneus is more robust and has better tensile integrity. Minimal slough and eschar accumulation. 03/30/2023: The wound length has decreased. This seems to be primarily due to more epithelium covering the posterior aspect of the site. Minimal slough and eschar buildup. 04/07/2023: The wound measured a little bit smaller again today. There is minimal slough accumulation with just a little bit of eschar and callus around the edges. 04/13/2023: The lateral aspect of his wound is  starting to epithelialize. There is very minimal slough on the surface. Moisture control is good. 04/20/2023: Continued, albeit extremely slow, encroachment of epithelium as of the wound surface. Minimal slough. Moisture balance is good. 04/27/2023: More epithelium is filling in from the heel area. There is a little bit of slough on the surface with some periwound callus and eschar. He did note a little bit of drainage coming from his right foot and there is a small crack in the skin extending medially from the main site of his prior ulceration. 05/04/2023: The wounds are stable this week. No significant change. There is a little bit of callus buildup around the edges of each site and some thin slough on the surface on the left foot. 05/11/2023: The skin on the right heel is filling in further. There is a little bit of slough accumulation on both surfaces along with periwound callus accumulation. 05/18/2023: Both wounds are smaller today. The right foot is down to just a sliver of opening. 05/25/2023: Epithelium continues to advance from the calcaneal area towards the midfoot on the left. On the right, there is tiniest crack of an opening that remains. 06/01/2023: No significant change bilaterally. He has built up quite a bit of callus on the calcaneal surface of the right foot. Electronic Signature(s) Signed: 06/01/2023 11:15:03 AM By: [name] MD FACS Entered By: [name] on 06/01/2023 08:15:02 -------------------------------------------------------------------------------- Physical Exam Details Patient Name: Date of Service: [name], TO [name] 06/01/2023 10:15 A M Medical Record Number: [id number] Patient Account Number: [id number] Date of Birth/Sex: Treating RN: 1975-11-17 (47 y.o. M) Primary Care Provider: Dorinda Hill Other Clinician: Referring Provider: Treating Provider/Extender: Jana Hakim in Treatment: 150 Constitutional . . . . no acute  distress. Respiratory Normal work of breathing on room air.. Notes 06/01/2023: No significant change bilaterally. He has built up quite a bit of callus on the calcaneal surface of the right foot. Electronic Signature(s) Signed: 06/01/2023 11:15:40 AM By: [name] MD FACS Entered By: [name] on 06/01/2023 08:15:40 [name] ([id number]) [id number] Page 8 of 19 -------------------------------------------------------------------------------- Physician Orders Details Patient Name: Date of Service: [name] 06/01/2023 10:15 A M Medical Record Number: [id number] Patient Account Number: [id number] Date of Birth/Sex: Treating RN: September 06, 1975 (47 y.o. [name] Primary Care Provider: Dorinda Hill Other Clinician: Referring Provider: Treating Provider/Extender: Jana Hakim in Treatment: 150 The following information was scribed by: Zenaida Deed The information was scribed for: [name] Verbal / Phone Orders: No Diagnosis Coding ICD-10 Coding Code Description L97.512 Non-pressure chronic ulcer of other part of right foot with  drainage noted. The wound margin is distinct with the outline attached to the wound base. There is large (67-100%) red granulation within the wound bed. There is no necrotic tissue within the wound bed. The periwound skin appearance had no abnormalities noted for color. The periwound skin appearance exhibited: Callus, Scarring, Dry/Scaly. The periwound skin appearance did not exhibit: Rash. Periwound temperature was noted as No Abnormality. The periwound has tenderness on palpation. 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 There was a Selective/Open Wound Non-Viable Tissue Debridement with a total area of 1.88 sq cm performed by [name], MD. With the following instrument(s): Curette to remove Non-Viable tissue/material. Material removed includes [facility] after achieving pain control using Lidocaine 4% Topical Solution. No specimens were taken. A time out was conducted at 10:50, prior to the start of the procedure. A Minimum amount of bleeding was controlled with Pressure. The procedure was tolerated well with a pain level of 0 throughout and a pain level of 0 following the procedure. Post Debridement Measurements: 2.4cm length x 1cm width x 0.1cm depth; 0.188cm^3 volume. Post debridement Stage noted as Category/Stage III. Character of Wound/Ulcer Post Debridement is improved. Post procedure Diagnosis Wound #2: Same as Pre-Procedure Wound #5 Pre-procedure  diagnosis of Wound #5 is a Pressure Ulcer located on the Right,Plantar Foot . There was a Selective/Open Wound Skin/Epidermis Debridement with a total area of 1.92 sq cm performed by [name], MD. With the following instrument(s): Curette to remove Non-Viable tissue/material. Material removed includes Callus, Slough, and Skin: Epidermis after achieving pain control using Lidocaine 4% Topical Solution. No specimens were taken. A time out was conducted at 10:50, prior to the start of the procedure. A Minimum amount of bleeding was controlled with Pressure. The procedure was tolerated well with a pain level of 0 throughout and a pain level of 0 following the procedure. Post Debridement Measurements: 3.5cm length x 0.7cm width x 0.4cm depth; 0.77cm^3 volume. Post debridement Stage noted as Category/Stage II. Character of Wound/Ulcer Post Debridement is improved. Post procedure Diagnosis Wound #5: Same as Pre-Procedure Plan 06/01/2023: No significant change bilaterally. He has built up quite a bit of callus on the calcaneal surface of the right foot. I used a curette to debride slough from the left heel ulcer and slough and callus from the right. We will continue topical gentamicin and silver alginate with drawtex for moisture management and Optifoam doughnuts for offloading in addition to the peg assist offloading inserts. He has an appointment scheduled with plastic surgery next Wednesday. He is in the process of completing requirements to undergo bariatric surgery and weight loss will certainly be of benefit to him as well, and reducing the pressure on his feet. Follow-up with me a week from today. Electronic Signature(s) Signed: 06/01/2023 11:17:36 AM By: [name] MD FACS Entered By: [name] on 06/01/2023 08:17:36 -------------------------------------------------------------------------------- HxROS Details Patient Name: Date of Service: [name], [name] 06/01/2023 10:15  A M Medical Record Number: [id number] Patient Account Number: [id number] Date of Birth/Sex: Treating RN: 1976/06/17 (47 y.o. M) Primary Care Provider: Dorinda Hill Other Clinician: Referring Provider: Treating Provider/Extender: Jana Hakim in Treatment: [address] ([id number]) [id number] Page 17 of 19 Information Obtained From Patient Constitutional Symptoms (General Health) Medical

## 2023-06-07 ENCOUNTER — Ambulatory Visit (INDEPENDENT_AMBULATORY_CARE_PROVIDER_SITE_OTHER): Payer: PPO | Admitting: Behavioral Health

## 2023-06-07 ENCOUNTER — Ambulatory Visit: Payer: PPO | Admitting: Plastic Surgery

## 2023-06-07 ENCOUNTER — Encounter: Payer: Self-pay | Admitting: Plastic Surgery

## 2023-06-07 VITALS — BP 121/78 | HR 75

## 2023-06-07 DIAGNOSIS — Z0389 Encounter for observation for other suspected diseases and conditions ruled out: Secondary | ICD-10-CM

## 2023-06-07 DIAGNOSIS — S91302A Unspecified open wound, left foot, initial encounter: Secondary | ICD-10-CM

## 2023-06-07 DIAGNOSIS — T148XXA Other injury of unspecified body region, initial encounter: Secondary | ICD-10-CM

## 2023-06-07 NOTE — Progress Notes (Signed)
Referring Provider Melida Quitter, MD [address] [place],  [place] [address]   CC:  Chief Complaint  Patient presents with   Advice Only      [name] is an 47 y.o. male.  HPI: Mr. Bonifay is a very pleasant 47 year old male who presents today with a 2+ year history of nonhealing ulcers on the plantar surface of his feet with the worst being over the heel on the left foot.  The patient has a history of developing COVID and requiring prolonged hospitalization and ventilation.  During that period of time he developed ulcers on the bottom of his feet.  He has undergone treatment by a podiatrist and has chronic ongoing therapy with the wound clinic.  The right foot is almost completely healed but the left foot still has a wound approximately 3 x 5.5 cm that extends down to the subcutaneous tissue but does cover the bone.  Patient states that he can ambulate but this is difficult due to the wounds on his feet which are painful when he stands on them.  He is a borderline diabetic with a last hemoglobin A1c in February 2024 of 6.1 his last white blood cell count also in February of this year was 6.6.  Patient is obese.  No Known Allergies  Outpatient Encounter Medications as of 06/07/2023  Medication Sig   albuterol (VENTOLIN HFA) 108 (90 Base) MCG/ACT inhaler TAKE 2 PUFFS BY MOUTH EVERY 4 HOURS AS NEEDED   amLODipine (NORVASC) 5 MG tablet Take 5 mg by mouth daily.   Budeson-Glycopyrrol-Formoterol (BREZTRI AEROSPHERE) 160-9-4.8 MCG/ACT AERO Inhale 2 puffs into the lungs in the morning and at bedtime.   DULoxetine (CYMBALTA) 60 MG capsule 1 capsule Orally Once a day for 90   fluocinolone (SYNALAR) 0.01 % external solution Apply topically 2 (two) times daily as needed.   HYDROcodone-acetaminophen (NORCO) 5-325 MG tablet Take 1 tablet by mouth 2 (two) times daily as needed for moderate pain.   ipratropium (ATROVENT) 0.06 % nasal spray Place 2 sprays into both nostrils 3 (three) times daily.    lamoTRIgine (LAMICTAL) 100 MG tablet Take 1 tablet (100 mg total) by mouth daily.   montelukast (SINGULAIR) 10 MG tablet Take 1 tablet (10 mg total) by mouth daily.   pantoprazole (PROTONIX) 40 MG tablet Take 1 tablet by mouth daily.   sildenafil (VIAGRA) 50 MG tablet 1 tablet as needed Orally Once a day   traZODone (DESYREL) 100 MG tablet Take 2 tablets (200 mg total) by mouth at bedtime.   triamcinolone (NASACORT) 55 MCG/ACT AERO nasal inhaler Place 2 sprays into the nose daily.   Vitamin D, Ergocalciferol, (DRISDOL) 1.25 MG (50000 UNIT) CAPS capsule TAKE 1 CAPSULE (50,000 UNITS TOTAL) BY MOUTH EVERY 7 (SEVEN) DAYS   No facility-administered encounter medications on file as of 06/07/2023.     Past Medical History:  Diagnosis Date   Anginal pain (HCC)    with covid   Anxiety    Asthma    Depression    Dyspnea    GERD (gastroesophageal reflux disease)    Headache    History of kidney stones    LEFT URETERAL STONE   HTN (hypertension)    Pancreatitis 2018   GALLBALDDER SLUDGE CAUSED ISSUED RESOLVED   Pneumonia 07/2019   covid   Prediabetes    SOBOE (shortness of breath on exertion)     Past Surgical History:  Procedure Laterality Date   CANNULATION FOR ECMO (EXTRACORPOREAL MEMBRANE OXYGENATION) N/A  08/28/2019   Procedure: CANNULATION FOR VV ECMO (EXTRACORPOREAL MEMBRANE OXYGENATION);  Surgeon: Donata Clay, Theron Arista, MD;  Location: [facility] OR;  Service: Open Heart Surgery;  Laterality: N/A;  CRESCENT CANNULA   CANNULATION FOR ECMO (EXTRACORPOREAL MEMBRANE OXYGENATION) N/A 09/10/2019   Procedure: CANNULATION FOR ECMO (EXTRACORPOREAL MEMBRANE OXYGENATION) PUTTING IN CRESCENT 32FR CANNULA  AND REMOVING GROING CANNULATION;  Surgeon: Linden Dolin, MD;  Location: [facility] OR;  Service: Open Heart Surgery;  Laterality: N/A;  PUTTING IN CRESCENT/REMOVING GROIN CANNULATION   CYSTOSCOPY/URETEROSCOPY/HOLMIUM LASER/STENT PLACEMENT Left 04/18/2019   Procedure: LEFT URETEROSCOPY/HOLMIUM LASER/STENT  PLACEMENT;  Surgeon: Crist Fat, MD;  Location: [facility];  Service: Urology;  Laterality: Left;   CYSTOSCOPY/URETEROSCOPY/HOLMIUM LASER/STENT PLACEMENT Left 05/02/2019   Procedure: CYSTOSCOPY/URETEROSCOPY/HOLMIUM LASER/STENT EXCHANGE;  Surgeon: Crist Fat, MD;  Location: [facility] ORS;  Service: Urology;  Laterality: Left;   ECMO CANNULATION N/A 08/03/2019   Procedure: ECMO CANNULATION;  Surgeon: Linden Dolin, MD;  Location: [facility];  Service: Cardiovascular;  Laterality: N/A;   ESOPHAGOGASTRODUODENOSCOPY N/A 09/11/2019   Procedure: ESOPHAGOGASTRODUODENOSCOPY (EGD);  Surgeon: Linden Dolin, MD;  Location: [facility] OR;  Service: Thoracic;  Laterality: N/A;   GRAFT APPLICATION Bilateral 03/27/2020   Procedure: APPLICATION OF SKIN GRAFT BILATERAL FEET;  Surgeon: Felecia Shelling, DPM;  Location: [facility];  Service: Podiatry;  Laterality: Bilateral;   IR REPLC GASTRO/COLONIC TUBE PERCUT W/FLUORO  10/14/2019   IRRIGATION AND DEBRIDEMENT SHOULDER Right 09/29/2017   Procedure: IRRIGATION AND DEBRIDEMENT SHOULDER;  Surgeon: Tarry Kos, MD;  Location: [facility] OR;  Service: Orthopedics;  Laterality: Right;   LUMBAR DISC SURGERY  2002   NASAL ENDOSCOPY WITH EPISTAXIS CONTROL N/A 08/31/2019   Procedure: NASAL ENDOSCOPY WITH EPISTAXIS CONTROL WITH CAUTERIZATION;  Surgeon: Christia Reading, MD;  Location: [facility] OR;  Service: ENT;  Laterality: N/A;   PORTACATH PLACEMENT N/A 09/11/2019   Procedure: PEG TUBE INSERTION - BEDSIDE;  Surgeon: Linden Dolin, MD;  Location: [facility] OR;  Service: Thoracic;  Laterality: N/A;   TEE WITHOUT CARDIOVERSION N/A 08/28/2019   Procedure: TRANSESOPHAGEAL ECHOCARDIOGRAM (TEE);  Surgeon: Donata Clay, Theron Arista, MD;  [facility];  Service: Open Heart Surgery;  Laterality: N/A;   TEE WITHOUT CARDIOVERSION N/A 09/10/2019   Procedure: TRANSESOPHAGEAL ECHOCARDIOGRAM (TEE);  Surgeon: Linden Dolin, MD;  Location: [facility] OR;  Service: Open Heart Surgery;  Laterality: N/A;    WOUND DEBRIDEMENT Bilateral 03/27/2020   Procedure: EXCISIONAL DEBRIDEMENT OF ULCERS BILATERAL FEET;  Surgeon: Felecia Shelling, DPM;  Location: [facility];  Service: Podiatry;  Laterality: Bilateral;    Family History  Problem Relation Age of Onset   Migraines Mother    GER disease Mother    Heart disease Father    Hyperlipidemia Father    Diabetes Father    Hypertension Father    Sleep apnea Father    Obesity Father    Asthma Brother    Pancreatic cancer Maternal Grandmother    Heart disease Maternal Grandfather    Diabetes Paternal Grandmother    Hypertension Paternal Grandmother     Social History   Social History Narrative   Not on file     Review of Systems General: Denies fevers, chills, weight loss CV: Denies chest pain, shortness of breath, palpitations Feet: Open wounds on the plantar surface of both feet.  Patient states that he has pain but there is no significant drainage.  Physical Exam    06/07/2023    8:42 AM 05/30/2023    2:01  PM 05/08/2023    9:42 AM  Vitals with BMI  Height  5' 8.5" 5' 8.5"  Weight  303 lbs 8 oz 303 lbs 13 oz  BMI  45.47 45.52  Systolic 121  118  Diastolic 78  75  Pulse 75  66    General:  No acute distress,  Alert and oriented, Non-Toxic, Normal speech and affect Feet, plantar surface: Patient has wounds on the plantar surface of both feet.  He has had significant wound healing but still has open wounds on both heels with the left greater than the right.  There is no evidence of ongoing infection, no drainage, no erythema.  The patient has weakly palpable dorsalis pedis and posterior tibial pulses with normal capillary refill.   Assessment/Plan Chronic wounds, bilateral plantar surface: Patient has an extremely difficult problem as his wounds are on a weightbearing portion of the foot.  I have told him that just placing a skin graft will absolutely not work that the skin graft will immediately fail once he starts putting pressure on  the foot again.  I suspect that he ultimately will require a fasciocutaneous free flap over the heel if he is to get the wound to heal however he would like to try a less invasive option first.  I have agreed to take him to the operating room and place myriad in the wound to see if we can stimulate any further healing of the wound on its own.  He understands that this is not in any way guaranteed but may be a reasonable option.  Risks of bleeding and infection were discussed.  Will schedule for operative placement of myriad at his request.  All questions were answered to his satisfaction.  Photographs were obtained today with his consent.  [name] 06/07/2023, 11:15 AM

## 2023-06-07 NOTE — Progress Notes (Signed)
Pt did not show for scheduled appt and did not provide 24 hour notice as required. Additional fees to be assessed.  

## 2023-06-08 ENCOUNTER — Encounter (HOSPITAL_BASED_OUTPATIENT_CLINIC_OR_DEPARTMENT_OTHER): Payer: PPO | Admitting: General Surgery

## 2023-06-12 ENCOUNTER — Ambulatory Visit (HOSPITAL_BASED_OUTPATIENT_CLINIC_OR_DEPARTMENT_OTHER): Payer: PPO | Attending: Surgery | Admitting: Physical Therapy

## 2023-06-12 DIAGNOSIS — M25662 Stiffness of left knee, not elsewhere classified: Secondary | ICD-10-CM | POA: Insufficient documentation

## 2023-06-12 DIAGNOSIS — M25661 Stiffness of right knee, not elsewhere classified: Secondary | ICD-10-CM | POA: Diagnosis not present

## 2023-06-12 DIAGNOSIS — R262 Difficulty in walking, not elsewhere classified: Secondary | ICD-10-CM | POA: Insufficient documentation

## 2023-06-12 DIAGNOSIS — M6281 Muscle weakness (generalized): Secondary | ICD-10-CM | POA: Diagnosis not present

## 2023-06-12 NOTE — Therapy (Signed)
OUTPATIENT PHYSICAL THERAPY TREATMENT NOTE   Patient Name: [name] MRN: [id number] DOB:[birth year], 47 y.o., male Today's Date: 06/13/2023  END OF SESSION:  PT End of Session - 06/13/23 2130     Visit Number 6    Number of Visits 8    Date for PT Re-Evaluation 07/10/23    Authorization Type HTA    PT Start Time 1405    PT Stop Time 1441    PT Time Calculation (min) 36 min    Activity Tolerance Patient tolerated treatment well    Behavior During Therapy WFL for tasks assessed/performed                  Past Medical History:  Diagnosis Date   Anginal pain (HCC)    with covid   Anxiety    Asthma    Depression    Dyspnea    GERD (gastroesophageal reflux disease)    Headache    History of kidney stones    LEFT URETERAL STONE   HTN (hypertension)    Pancreatitis 2018   GALLBALDDER SLUDGE CAUSED ISSUED RESOLVED   Pneumonia 07/2019   covid   Prediabetes    SOBOE (shortness of breath on exertion)    Past Surgical History:  Procedure Laterality Date   CANNULATION FOR ECMO (EXTRACORPOREAL MEMBRANE OXYGENATION) N/A 08/28/2019   Procedure: CANNULATION FOR VV ECMO (EXTRACORPOREAL MEMBRANE OXYGENATION);  Surgeon: Donata Clay, Theron Arista, MD;  Location: [facility] OR;  Service: Open Heart Surgery;  Laterality: N/A;  CRESCENT CANNULA   CANNULATION FOR ECMO (EXTRACORPOREAL MEMBRANE OXYGENATION) N/A 09/10/2019   Procedure: CANNULATION FOR ECMO (EXTRACORPOREAL MEMBRANE OXYGENATION) PUTTING IN CRESCENT 32FR CANNULA  AND REMOVING GROING CANNULATION;  Surgeon: Linden Dolin, MD;  Location: [facility] OR;  Service: Open Heart Surgery;  Laterality: N/A;  PUTTING IN CRESCENT/REMOVING GROIN CANNULATION   CYSTOSCOPY/URETEROSCOPY/HOLMIUM LASER/STENT PLACEMENT Left 04/18/2019   Procedure: LEFT URETEROSCOPY/HOLMIUM LASER/STENT PLACEMENT;  Surgeon: Crist Fat, MD;  Location: [facility];  Service: Urology;  Laterality: Left;   CYSTOSCOPY/URETEROSCOPY/HOLMIUM LASER/STENT PLACEMENT  Left 05/02/2019   Procedure: CYSTOSCOPY/URETEROSCOPY/HOLMIUM LASER/STENT EXCHANGE;  Surgeon: Crist Fat, MD;  Location: [facility] ORS;  Service: Urology;  Laterality: Left;   ECMO CANNULATION N/A 08/03/2019   Procedure: ECMO CANNULATION;  Surgeon: Linden Dolin, MD;  Location: [facility];  Service: Cardiovascular;  Laterality: N/A;   ESOPHAGOGASTRODUODENOSCOPY N/A 09/11/2019   Procedure: ESOPHAGOGASTRODUODENOSCOPY (EGD);  Surgeon: Linden Dolin, MD;  Location: [facility] OR;  Service: Thoracic;  Laterality: N/A;   GRAFT APPLICATION Bilateral 03/27/2020   Procedure: APPLICATION OF SKIN GRAFT BILATERAL FEET;  Surgeon: Felecia Shelling, DPM;  Location: [facility];  Service: Podiatry;  Laterality: Bilateral;   IR REPLC GASTRO/COLONIC TUBE PERCUT W/FLUORO  10/14/2019   IRRIGATION AND DEBRIDEMENT SHOULDER Right 09/29/2017   Procedure: IRRIGATION AND DEBRIDEMENT SHOULDER;  Surgeon: Tarry Kos, MD;  Location: [facility] OR;  Service: Orthopedics;  Laterality: Right;   LUMBAR DISC SURGERY  2002   NASAL ENDOSCOPY WITH EPISTAXIS CONTROL N/A 08/31/2019   Procedure: NASAL ENDOSCOPY WITH EPISTAXIS CONTROL WITH CAUTERIZATION;  Surgeon: Christia Reading, MD;  Location: [facility] OR;  Service: ENT;  Laterality: N/A;   PORTACATH PLACEMENT N/A 09/11/2019   Procedure: PEG TUBE INSERTION - BEDSIDE;  Surgeon: Linden Dolin, MD;  Location: [facility] OR;  Service: Thoracic;  Laterality: N/A;   TEE WITHOUT CARDIOVERSION N/A 08/28/2019   Procedure: TRANSESOPHAGEAL ECHOCARDIOGRAM (TEE);  Surgeon: Donata Clay, Theron Arista, MD;  Location: [facility] OR;  Service: Open  Heart Surgery;  Laterality: N/A;   TEE WITHOUT CARDIOVERSION N/A 09/10/2019   Procedure: TRANSESOPHAGEAL ECHOCARDIOGRAM (TEE);  Surgeon: Linden Dolin, MD;  Location: [facility] OR;  Service: Open Heart Surgery;  Laterality: N/A;   WOUND DEBRIDEMENT Bilateral 03/27/2020   Procedure: EXCISIONAL DEBRIDEMENT OF ULCERS BILATERAL FEET;  Surgeon: Felecia Shelling, DPM;  Location: [facility];  Service: Podiatry;   Laterality: Bilateral;   Patient Active Problem List   Diagnosis Date Noted   Type 2 diabetes mellitus without complication, without long-term current use of insulin (HCC) 10/18/2022   Vitamin D deficiency 10/18/2022   Other fatigue 09/07/2022   SOBOE (shortness of breath on exertion) 09/07/2022   Nocturnal hypoxemia 09/07/2022   Mood disorder (HCC) 09/07/2022   Ulcer of left foot (HCC) 09/07/2022   Depression 08/23/2022   Prediabetes 08/23/2022   Obstructive sleep apnea 01/11/2022   Insomnia due to other mental disorder 12/06/2021   Left foot drop 09/06/2021   Wheelchair dependence 06/04/2021   Encounter for therapeutic drug monitoring 09/29/2020   COVID-19 long hauler manifesting chronic dyspnea 09/15/2020   Morbid obesity (HCC) with starting BMI 46 09/15/2020   Encounter for attention to tracheostomy (HCC) 09/15/2020   Critical illness polyneuropathy (HCC) 09/15/2020   Moderate protein-calorie malnutrition (HCC) 09/15/2020   Pyogenic inflammation of bone (HCC) 09/09/2020   Long COVID 09/09/2020   Acute osteomyelitis (HCC) 08/31/2020   MSSA (methicillin susceptible Staphylococcus aureus) infection 08/31/2020   Reactive depression 07/01/2020   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   Impaired sensation to light touch 04/15/2020   Diffuse alveolar damage (HCC) 03/09/2020   Eschar of foot    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   History of COVID-19    Pressure injury of skin 08/22/2019   Need for emotional support    Chest tube in place    Personal history of ECMO    Advanced care planning/counseling discussion    Palliative care by specialist    Goals of care, counseling/discussion    Advanced directives, counseling/discussion    Subcutaneous crepitus    Thrombocytopenia (HCC)    Leukopenia    Fever    Gram positive bacterial infection    Septic arthritis of right acromioclavicular joint (HCC) 09/29/2017   Chronic left-sided low back  pain with left-sided sciatica 09/19/2017   Epigastric pain    Primary hypertension 04/17/2017   Moderate persistent asthma 07/21/2015   Allergic rhinoconjunctivitis 07/21/2015   GERD (gastroesophageal reflux disease) 07/21/2015   Abnormal gait 11/12/2009   TARSAL TUNNEL SYNDROME, LEFT 10/08/2009   PES PLANUS 10/08/2009     REFERRING PROVIDER: Berna Bue, MD   REFERRING DIAG: E66.01 (ICD-10-CM) - Morbid obesity due to excess calories   Rationale for Evaluation and Treatment: Rehabilitation  THERAPY DIAG:  Muscle weakness (generalized)  Stiffness of right knee, not elsewhere classified  Stiffness of left knee, not elsewhere classified  Difficulty in walking, not elsewhere classified  ONSET DATE: PT order 02/03/2023  SUBJECTIVE:  SUBJECTIVE STATEMENT: Pt reports he has been performing his HEP.  He also began working out with his 10# dumbbells performing shoulder press and biceps curl.  Pt has been performing his HEP.  Pt sees wound care weekly, but missed last week.  Pt states his wounds are still healing.      PERTINENT HISTORY:  L foot ulcer--Pt primarily in W/C and not putting much weight through L LE.  Pt was instructed to stay off his foot as much as possible.  He is in a offloader shoe. Debridement of ulcers in bilat feet and skin graft in 2021.  R foot ulcer with good healing.  Pt receives wound care for L foot ulcer.  Pt states he has some dropfoot on L.   Long covid, chronic back pain, arthritis, depression, HTN, Dyspnea,  Lumbar surgery for bulging disc in 2002 R shoulder arthrotomy of AC jt, irrigation and debridement of distal clavicle, and partial excision of distal clavicle on 2019   PAIN:  Location:  back NPRS:  0/10 current, 8/10 worst, 2/10 best  Knees "a little  achy, but not bad"  PRECAUTIONS: L foot ulcer--avoid WB'ing of L LE, long covid, lumbar surgery in 2002    WEIGHT BEARING RESTRICTIONS: Pt states he doesn't have specific WB'ing restrictions though was instructed to stay off his foot as much as possible due to L foot ulcer.    FALLS:  Has patient fallen in last 6 months? Yes. Number of falls 2  LIVING ENVIRONMENT: Lives with: lives with their family Lives in: 1 story home Stairs: 5-6 steps with rails; has a ramp Has following equipment at home: W/C, FWW  OCCUPATION: Pt is on disability.  PLOF: Independent  PATIENT GOALS: improve muscle strength and stamina, to be able to walk again.  Pt wants to be able to perform yard work.  He wants to be able to stand to cook and take a shower.    OBJECTIVE:   DIAGNOSTIC FINDINGS:  Chest x ray on 03/02/2023: FINDINGS: Stable cardiac and mediastinal contours. Similar scarring within the left mid and lower lung and right upper lung. No pleural effusion or pneumothorax. Thoracic spine degenerative changes.   IMPRESSION: No acute cardiopulmonary process. Similar scarring.   TODAY'S TREATMENT:                                                                                                                                     Pt performed:   Seated march with BTB x10, Black T-band 2x10 reps   Seated clams with GTB 3x10   Seated shoulder horizontal abduction with GTB 3x10   Seated rows with GTB 3x10   Seated shoulder ER with RTB 2x10    LAQ 5# 2x10 reps   Seated HS curls with RTB 2 x10 bilat      See below for pt education.       PATIENT EDUCATION:  Education details:  POC, HEP, exercise form, relevant anatomy, and rationale of exercises.  Person educated: Patient and wife Education method: Explanation, demonstration, verbal and tactile cues Education comprehension: verbalized understanding, returned demonstration, verbal and tactile cues required  HOME EXERCISE PROGRAM: Access  Code: [email or web address] URL: [email or web address] Date: 04/17/2023 Prepared by: Aaron Edelman  Exercises - Seated March  - 1-2 x daily - 7 x weekly - 1-2 sets - 10 reps - Seated Long Arc Quad  - 1 x daily - 7 x weekly - 2 sets - 10 reps - Seated Shoulder Row with Anchored Resistance  - 1 x daily - 4-5 x weekly - 2 sets - 10 reps - Seated Bilateral Shoulder External Rotation with Resistance  - 1 x daily - 4 x weekly - 2 sets - 10 reps - Seated Shoulder Horizontal Abduction with Resistance - Palms Down  - 1 x daily - 4 x weekly - 2 sets - 10 reps   ASSESSMENT:  CLINICAL IMPRESSION: Pt has increased compliance with HEP.  PT increased resistance with select exercises and pt tolerated exercises well.  Pt performed exercises well with cuing for correct form.  He has wounds on his feet that limit his Wb'ing and exercise ability.  Pt responded well to Rx having no c/o's after Rx.  He should benefit from cont skilled PT services to address ongoing goals and improve tolerance to activity and overall function.      OBJECTIVE IMPAIRMENTS: decreased activity tolerance, decreased endurance, decreased mobility, difficulty walking, decreased ROM, decreased strength, hypomobility, impaired flexibility, and pain.   ACTIVITY LIMITATIONS: carrying, standing, squatting, stairs, transfers, and locomotion level  PARTICIPATION LIMITATIONS: meal prep, cleaning, laundry, shopping, community activity, and occupation  PERSONAL FACTORS: Fitness and 3+ comorbidities: L foot ulcer, Wb'ing, long covid, lumbar surgery, chronic back pain, arthritis, depression  are also affecting patient's functional outcome.   REHAB POTENTIAL: good for goals  CLINICAL DECISION MAKING: Evolving/moderate complexity  EVALUATION COMPLEXITY: Low   GOALS:   SHORT TERM GOALS: Target date: 05/01/2023   Pt will perform HEP without adverse effects for improved ROM and strength.  Baseline: Goal status: GOAL MET    LONG TERM  GOALS: Target date: 07/10/2023  Pt will demo 0 deg of R knee extension AROM and at least 115 deg of bilat knee flexion AROM for improved stiffness and mobility and for improved gait when he is able to ambulate more.   Baseline:  Goal status: PROGRESSING  10/21  2.  Pt will be independent and compliant with HEP for improved pain, ROM, strength, and function.  Baseline:  Goal status: PROGRESSING 10/21  3.  Pt will report at least a 50% improvement in stamina/tolerance with daily activities.  Baseline:  Goal status:  60% MET  10/21    PLAN:  PT FREQUENCY:  once every other week  PT DURATION: 6 weeks  PLANNED INTERVENTIONS: Therapeutic exercises, Therapeutic activity, Neuromuscular re-education, Balance training, Gait training, Patient/Family education, Self Care, Joint mobilization, Stair training, DME instructions, Electrical stimulation, Spinal mobilization, Cryotherapy, Moist heat, Taping, Ultrasound, Manual therapy, and Re-evaluation.  PLAN FOR NEXT SESSION: Avoid WB'ing of L LE until more healing has taken place and wound care.  Cont with ther ex.  Review and perform HEP.  Knee ROM.    Audie Clear III PT, DPT 06/13/23 9:49 PM

## 2023-06-13 ENCOUNTER — Encounter (HOSPITAL_BASED_OUTPATIENT_CLINIC_OR_DEPARTMENT_OTHER): Payer: Self-pay | Admitting: Physical Therapy

## 2023-06-15 ENCOUNTER — Encounter (HOSPITAL_BASED_OUTPATIENT_CLINIC_OR_DEPARTMENT_OTHER): Payer: PPO | Attending: General Surgery | Admitting: General Surgery

## 2023-06-15 DIAGNOSIS — L97512 Non-pressure chronic ulcer of other part of right foot with fat layer exposed: Secondary | ICD-10-CM | POA: Diagnosis not present

## 2023-06-15 DIAGNOSIS — I10 Essential (primary) hypertension: Secondary | ICD-10-CM | POA: Insufficient documentation

## 2023-06-15 DIAGNOSIS — L89892 Pressure ulcer of other site, stage 2: Secondary | ICD-10-CM | POA: Diagnosis not present

## 2023-06-15 DIAGNOSIS — L97522 Non-pressure chronic ulcer of other part of left foot with fat layer exposed: Secondary | ICD-10-CM | POA: Insufficient documentation

## 2023-06-15 DIAGNOSIS — L89623 Pressure ulcer of left heel, stage 3: Secondary | ICD-10-CM | POA: Diagnosis not present

## 2023-06-15 DIAGNOSIS — Z8616 Personal history of COVID-19: Secondary | ICD-10-CM | POA: Insufficient documentation

## 2023-06-19 ENCOUNTER — Other Ambulatory Visit (HOSPITAL_COMMUNITY): Payer: Self-pay | Admitting: General Surgery

## 2023-06-19 DIAGNOSIS — L97512 Non-pressure chronic ulcer of other part of right foot with fat layer exposed: Secondary | ICD-10-CM

## 2023-06-19 DIAGNOSIS — L97522 Non-pressure chronic ulcer of other part of left foot with fat layer exposed: Secondary | ICD-10-CM

## 2023-06-20 ENCOUNTER — Telehealth: Payer: Self-pay

## 2023-06-20 ENCOUNTER — Encounter: Payer: Self-pay | Admitting: Surgical

## 2023-06-20 ENCOUNTER — Ambulatory Visit (INDEPENDENT_AMBULATORY_CARE_PROVIDER_SITE_OTHER): Payer: PPO | Admitting: Surgical

## 2023-06-20 VITALS — BP 126/75 | HR 64

## 2023-06-20 DIAGNOSIS — T148XXA Other injury of unspecified body region, initial encounter: Secondary | ICD-10-CM

## 2023-06-20 MED ORDER — ONDANSETRON HCL 4 MG PO TABS: 4.0000 mg | ORAL_TABLET | Freq: Three times a day (TID) | ORAL | 0 refills | Status: DC | PRN

## 2023-06-20 NOTE — Progress Notes (Signed)
Patient ID: [name], male    DOB: 1976/02/16, 47 y.o.   MRN: [id number]  Chief Complaint  Patient presents with   Pre-op Exam      ICD-10-CM   1. Chronic wound  T14.8XXA       History of Present Illness: AVEON NEWBORN is a 47 y.o.  male  with a history of 2-year history of nonhealing ulcers in the plantar surface of his feet.  He presents for preoperative evaluation for upcoming procedure, left plantar surface wound preparation and myriad placement, scheduled for 06/30/2023 with Dr. Ladona Ridgel.  Patient presents today with his spouse.  The patient has not had problems with anesthesia. No history of DVT/PE.  No family history of DVT/PE.  No family or personal history of bleeding or clotting disorders.  Patient is not currently taking any blood thinners.  No history of CVA/MI.   Summary of Previous Visit: Patient with 2-year history of nonhealing ulcers on the plantar surface of his feet with the worst being over the heel of his left foot.  He has a history of developing COVID and requiring prolonged hospitalization and ventilation.  During that time he developed ulcers on the bottom of his feet.  He has underwent treatment by podiatrist and chronic ongoing therapy with wound care clinic.  The left foot wound is approximately 3 x 5.5 cm and extends down to the subcutaneous tissue.  He is a borderline diabetic with last A1c February 2024 of 6.1.  PMH Significant for: Moderate persistent asthma, GERD, prediabetes, shortness of breath on exertion, history of COVID requiring hospitalization and ventilation.  History of COVID ICU myopathy  History of osteomyelitis of right foot.  History of left foot drop.  Is on chronic Norco 12/09/2023 twice daily PRN, he reports he did not pick up last rx due to error in rx. It was sent to wrong pharmacy, He reports he took norco PRN, but has not needed it lately.  Patient reports he is overall doing well, he has not had any significant changes to his  health.  He has been going to wound care weekly for assistance with dressing changes.  He reports that he feels as if his health is at baseline, he has not had any recent pulmonary exacerbations.   Past Medical History: Allergies: No Known Allergies  Current Medications:  Current Outpatient Medications:    albuterol (VENTOLIN HFA) 108 (90 Base) MCG/ACT inhaler, TAKE 2 PUFFS BY MOUTH EVERY 4 HOURS AS NEEDED, Disp: 18 each, Rfl: 1   amLODipine (NORVASC) 5 MG tablet, Take 5 mg by mouth daily., Disp: , Rfl:    Budeson-Glycopyrrol-Formoterol (BREZTRI AEROSPHERE) 160-9-4.8 MCG/ACT AERO, Inhale 2 puffs into the lungs in the morning and at bedtime., Disp: 3 each, Rfl: 1   DULoxetine (CYMBALTA) 60 MG capsule, 1 capsule Orally Once a day for 90, Disp: 90 capsule, Rfl: 1   fluocinolone (SYNALAR) 0.01 % external solution, Apply topically 2 (two) times daily as needed., Disp: 60 mL, Rfl: 2   ipratropium (ATROVENT) 0.06 % nasal spray, Place 2 sprays into both nostrils 3 (three) times daily., Disp: 15 mL, Rfl: 5   lamoTRIgine (LAMICTAL) 100 MG tablet, Take 1 tablet (100 mg total) by mouth daily., Disp: 90 tablet, Rfl: 1   montelukast (SINGULAIR) 10 MG tablet, Take 1 tablet (10 mg total) by mouth daily., Disp: 90 tablet, Rfl: 1   ondansetron (ZOFRAN) 4 MG tablet, Take 1 tablet (4 mg total) by mouth every 8 (  eight) hours as needed for nausea or vomiting., Disp: 20 tablet, Rfl: 0   pantoprazole (PROTONIX) 40 MG tablet, Take 1 tablet by mouth daily., Disp: , Rfl:    sildenafil (VIAGRA) 50 MG tablet, 1 tablet as needed Orally Once a day, Disp: , Rfl:    traZODone (DESYREL) 100 MG tablet, Take 2 tablets (200 mg total) by mouth at bedtime., Disp: 180 tablet, Rfl: 3   triamcinolone (NASACORT) 55 MCG/ACT AERO nasal inhaler, Place 2 sprays into the nose daily., Disp: , Rfl:    Vitamin D, Ergocalciferol, (DRISDOL) 1.25 MG (50000 UNIT) CAPS capsule, TAKE 1 CAPSULE (50,000 UNITS TOTAL) BY MOUTH EVERY 7 (SEVEN) DAYS, Disp:  5 capsule, Rfl: 0   HYDROcodone-acetaminophen (NORCO) 5-325 MG tablet, Take 1 tablet by mouth 2 (two) times daily as needed for moderate pain. (Patient not taking: Reported on 06/20/2023), Disp: 60 tablet, Rfl: 0  Past Medical Problems: Past Medical History:  Diagnosis Date   Anginal pain (HCC)    with covid   Anxiety    Asthma    Depression    Dyspnea    GERD (gastroesophageal reflux disease)    Headache    History of kidney stones    LEFT URETERAL STONE   HTN (hypertension)    Pancreatitis 2018   GALLBALDDER SLUDGE CAUSED ISSUED RESOLVED   Pneumonia 07/2019   covid   Prediabetes    SOBOE (shortness of breath on exertion)     Past Surgical History: Past Surgical History:  Procedure Laterality Date   CANNULATION FOR ECMO (EXTRACORPOREAL MEMBRANE OXYGENATION) N/A 08/28/2019   Procedure: CANNULATION FOR VV ECMO (EXTRACORPOREAL MEMBRANE OXYGENATION);  Surgeon: Donata Clay, Theron Arista, MD;  Location: [facility] OR;  Service: Open Heart Surgery;  Laterality: N/A;  CRESCENT CANNULA   CANNULATION FOR ECMO (EXTRACORPOREAL MEMBRANE OXYGENATION) N/A 09/10/2019   Procedure: CANNULATION FOR ECMO (EXTRACORPOREAL MEMBRANE OXYGENATION) PUTTING IN CRESCENT 32FR CANNULA  AND REMOVING GROING CANNULATION;  Surgeon: Linden Dolin, MD;  Location: [facility] OR;  Service: Open Heart Surgery;  Laterality: N/A;  PUTTING IN CRESCENT/REMOVING GROIN CANNULATION   CYSTOSCOPY/URETEROSCOPY/HOLMIUM LASER/STENT PLACEMENT Left 04/18/2019   Procedure: LEFT URETEROSCOPY/HOLMIUM LASER/STENT PLACEMENT;  Surgeon: Crist Fat, MD;  Location: [facility];  Service: Urology;  Laterality: Left;   CYSTOSCOPY/URETEROSCOPY/HOLMIUM LASER/STENT PLACEMENT Left 05/02/2019   Procedure: CYSTOSCOPY/URETEROSCOPY/HOLMIUM LASER/STENT EXCHANGE;  Surgeon: Crist Fat, MD;  Location: [facility];  Service: Urology;  Laterality: Left;   ECMO CANNULATION N/A 08/03/2019   Procedure: ECMO CANNULATION;  Surgeon: Linden Dolin, MD;   Location: [facility];  Service: Cardiovascular;  Laterality: N/A;   ESOPHAGOGASTRODUODENOSCOPY N/A 09/11/2019   Procedure: ESOPHAGOGASTRODUODENOSCOPY (EGD);  Surgeon: Linden Dolin, MD;  Location: [facility] OR;  Service: Thoracic;  Laterality: N/A;   GRAFT APPLICATION Bilateral 03/27/2020   Procedure: APPLICATION OF SKIN GRAFT BILATERAL FEET;  Surgeon: Felecia Shelling, DPM;  Location: [facility];  Service: Podiatry;  Laterality: Bilateral;   IR REPLC GASTRO/COLONIC TUBE PERCUT W/FLUORO  10/14/2019   IRRIGATION AND DEBRIDEMENT SHOULDER Right 09/29/2017   Procedure: IRRIGATION AND DEBRIDEMENT SHOULDER;  Surgeon: Tarry Kos, MD;  Location: [facility] OR;  Service: Orthopedics;  Laterality: Right;   LUMBAR DISC SURGERY  2002   NASAL ENDOSCOPY WITH EPISTAXIS CONTROL N/A 08/31/2019   Procedure: NASAL ENDOSCOPY WITH EPISTAXIS CONTROL WITH CAUTERIZATION;  Surgeon: Christia Reading, MD;  Location: [facility] OR;  Service: ENT;  Laterality: N/A;   PORTACATH PLACEMENT N/A 09/11/2019   Procedure: PEG TUBE INSERTION - BEDSIDE;  Surgeon: Linden Dolin, MD;  Location: [facility] OR;  Service: Thoracic;  Laterality: N/A;   TEE WITHOUT CARDIOVERSION N/A 08/28/2019   Procedure: TRANSESOPHAGEAL ECHOCARDIOGRAM (TEE);  Surgeon: Donata Clay, Theron Arista, MD;  Location: [facility] OR;  Service: Open Heart Surgery;  Laterality: N/A;   TEE WITHOUT CARDIOVERSION N/A 09/10/2019   Procedure: TRANSESOPHAGEAL ECHOCARDIOGRAM (TEE);  Surgeon: Linden Dolin, MD;  Location: [facility] OR;  Service: Open Heart Surgery;  Laterality: N/A;   WOUND DEBRIDEMENT Bilateral 03/27/2020   Procedure: EXCISIONAL DEBRIDEMENT OF ULCERS BILATERAL FEET;  Surgeon: Felecia Shelling, DPM;  Location: [facility];  Service: Podiatry;  Laterality: Bilateral;    Social History: Social History   Socioeconomic History   Marital status: Married    Spouse name: Not on file   Number of children: 1   Years of education: Not on file   Highest education level: Not on file  Occupational History   Occupation:  delivery driver   Occupation: Stay at home spouse  Tobacco Use   Smoking status: Never   Smokeless tobacco: Never  Vaping Use   Vaping status: Never Used  Substance and Sexual Activity   Alcohol use: Yes    Comment: rarely 2-3 times a year   Drug use: No   Sexual activity: Yes    Partners: Female  Other Topics Concern   Not on file  Social History Narrative   Not on file   Social Determinants of Health   Financial Resource Strain: Not on file  Food Insecurity: Not on file  Transportation Needs: Not on file  Physical Activity: Not on file  Stress: Not on file  Social Connections: Not on file  Intimate Partner Violence: Not on file    Family History: Family History  Problem Relation Age of Onset   Migraines Mother    GER disease Mother    Heart disease Father    Hyperlipidemia Father    Diabetes Father    Hypertension Father    Sleep apnea Father    Obesity Father    Asthma Brother    Pancreatic cancer Maternal Grandmother    Heart disease Maternal Grandfather    Diabetes Paternal Grandmother    Hypertension Paternal Grandmother     Review of Systems: Review of Systems  Constitutional: Negative.   Respiratory: Negative.    Cardiovascular: Negative.   Gastrointestinal: Negative.   Neurological: Negative.     Physical Exam: Vital Signs BP 126/75 (BP Location: Left Arm, Patient Position: Sitting, Cuff Size: Large)   Pulse 64   SpO2 92%   Physical Exam  Constitutional:      General: Not in acute distress.    Appearance: Normal appearance. Not ill-appearing.  HENT:     Head: Normocephalic and atraumatic.  Eyes:     Pupils: Pupils are equal, round Neck:     Musculoskeletal: Normal range of motion.  Cardiovascular:     Rate and Rhythm: Normal rate    Pulses: Normal pulses.  Pulmonary:     Effort: Pulmonary effort is normal. No respiratory distress.  Abdominal:     General: Abdomen is flat. There is no distension.  Musculoskeletal: Normal range of  motion.  Skin:    General: Skin is warm and dry.     Findings: No erythema or rash.  Neurological:     General: No focal deficit present.     Mental Status: Alert and oriented to person, place, and time. Mental status is at baseline.     Motor:  No weakness.  Psychiatric:        Mood and Affect: Mood normal.        Behavior: Behavior normal.    Assessment/Plan: The patient is scheduled for left plantar surface wound preparation with placement of myriad wound matrix with Dr. Ladona Ridgel.  Risks, benefits, and alternatives of procedure discussed, questions answered and consent obtained.    Smoking Status: non smoker; Counseling Given? N/a  Caprini Score: 5, moderate; Risk Factors include: Age, BMI > 40, and length of planned surgery. Recommendation for mechanical prophylaxis. Encourage early ambulation.   Pictures obtained: @consult   Post-op Rx sent to pharmacy: Zofran. May need postop Norco, however he is rx norco by Dr Berline Chough with [facility]. Asked patient to discuss pain medicine with Dr. Berline Chough to confirm no problem with [place] rx post-op pain medications. Patient was agreeable with this plan.  Patient was provided with the General Surgical Risk consent document and Pain Medication Agreement prior to their appointment.  They had adequate time to read through the risk consent documents and Pain Medication Agreement. We also discussed them in person together during this preop appointment. All of their questions were answered to their satisfaction.  Recommended calling if they have any further questions.  Risk consent form and Pain Medication Agreement to be scanned into patient's chart.  Electronically signed by: Kermit Balo Lamiracle Chaidez, PA-C 06/20/2023 1:51 PM

## 2023-06-20 NOTE — Telephone Encounter (Signed)
Patient is having plastic surgery on left foot on 06/30/23.

## 2023-06-20 NOTE — H&P (View-Only) (Signed)
Patient ID: [name], male    DOB: [birth year], 47 y.o.   MRN: [id number]  Chief Complaint  Patient presents with   Pre-op Exam      ICD-10-CM   1. Chronic wound  T14.8XXA       History of Present Illness: [name] is a 48 y.o.  male  with a history of 2-year history of nonhealing ulcers in the plantar surface of his feet.  He presents for preoperative evaluation for upcoming procedure, left plantar surface wound preparation and myriad placement, scheduled for 06/30/2023 with Dr. Ladona Ridgel.  Patient presents today with his spouse.  The patient has not had problems with anesthesia. No history of DVT/PE.  No family history of DVT/PE.  No family or personal history of bleeding or clotting disorders.  Patient is not currently taking any blood thinners.  No history of CVA/MI.   Summary of Previous Visit: Patient with 2-year history of nonhealing ulcers on the plantar surface of his feet with the worst being over the heel of his left foot.  He has a history of developing COVID and requiring prolonged hospitalization and ventilation.  During that time he developed ulcers on the bottom of his feet.  He has underwent treatment by podiatrist and chronic ongoing therapy with wound care clinic.  The left foot wound is approximately 3 x 5.5 cm and extends down to the subcutaneous tissue.  He is a borderline diabetic with last A1c February 2024 of 6.1.  PMH Significant for: Moderate persistent asthma, GERD, prediabetes, shortness of breath on exertion, history of COVID requiring hospitalization and ventilation.  History of COVID ICU myopathy  History of osteomyelitis of right foot.  History of left foot drop.  Is on chronic Norco 12/09/2023 twice daily PRN, he reports he did not pick up last rx due to error in rx. It was sent to wrong pharmacy, He reports he took norco PRN, but has not needed it lately.  Patient reports he is overall doing well, he has not had any significant changes to his  health.  He has been going to wound care weekly for assistance with dressing changes.  He reports that he feels as if his health is at baseline, he has not had any recent pulmonary exacerbations.   Past Medical History: Allergies: No Known Allergies  Current Medications:  Current Outpatient Medications:    albuterol (VENTOLIN HFA) 108 (90 Base) MCG/ACT inhaler, TAKE 2 PUFFS BY MOUTH EVERY 4 HOURS AS NEEDED, Disp: 18 each, Rfl: 1   amLODipine (NORVASC) 5 MG tablet, Take 5 mg by mouth daily., Disp: , Rfl:    Budeson-Glycopyrrol-Formoterol (BREZTRI AEROSPHERE) 160-9-4.8 MCG/ACT AERO, Inhale 2 puffs into the lungs in the morning and at bedtime., Disp: 3 each, Rfl: 1   DULoxetine (CYMBALTA) 60 MG capsule, 1 capsule Orally Once a day for 90, Disp: 90 capsule, Rfl: 1   fluocinolone (SYNALAR) 0.01 % external solution, Apply topically 2 (two) times daily as needed., Disp: 60 mL, Rfl: 2   ipratropium (ATROVENT) 0.06 % nasal spray, Place 2 sprays into both nostrils 3 (three) times daily., Disp: 15 mL, Rfl: 5   lamoTRIgine (LAMICTAL) 100 MG tablet, Take 1 tablet (100 mg total) by mouth daily., Disp: 90 tablet, Rfl: 1   montelukast (SINGULAIR) 10 MG tablet, Take 1 tablet (10 mg total) by mouth daily., Disp: 90 tablet, Rfl: 1   ondansetron (ZOFRAN) 4 MG tablet, Take 1 tablet (4 mg total) by mouth every 8 (  eight) hours as needed for nausea or vomiting., Disp: 20 tablet, Rfl: 0   pantoprazole (PROTONIX) 40 MG tablet, Take 1 tablet by mouth daily., Disp: , Rfl:    sildenafil (VIAGRA) 50 MG tablet, 1 tablet as needed Orally Once a day, Disp: , Rfl:    traZODone (DESYREL) 100 MG tablet, Take 2 tablets (200 mg total) by mouth at bedtime., Disp: 180 tablet, Rfl: 3   triamcinolone (NASACORT) 55 MCG/ACT AERO nasal inhaler, Place 2 sprays into the nose daily., Disp: , Rfl:    Vitamin D, Ergocalciferol, (DRISDOL) 1.25 MG (50000 UNIT) CAPS capsule, TAKE 1 CAPSULE (50,000 UNITS TOTAL) BY MOUTH EVERY 7 (SEVEN) DAYS, Disp:  5 capsule, Rfl: 0   HYDROcodone-acetaminophen (NORCO) 5-325 MG tablet, Take 1 tablet by mouth 2 (two) times daily as needed for moderate pain. (Patient not taking: Reported on 06/20/2023), Disp: 60 tablet, Rfl: 0  Past Medical Problems: Past Medical History:  Diagnosis Date   Anginal pain (HCC)    with covid   Anxiety    Asthma    Depression    Dyspnea    GERD (gastroesophageal reflux disease)    Headache    History of kidney stones    LEFT URETERAL STONE   HTN (hypertension)    Pancreatitis 2018   GALLBALDDER SLUDGE CAUSED ISSUED RESOLVED   Pneumonia 07/2019   covid   Prediabetes    SOBOE (shortness of breath on exertion)     Past Surgical History: Past Surgical History:  Procedure Laterality Date   CANNULATION FOR ECMO (EXTRACORPOREAL MEMBRANE OXYGENATION) N/A 08/28/2019   Procedure: CANNULATION FOR VV ECMO (EXTRACORPOREAL MEMBRANE OXYGENATION);  Surgeon: Donata Clay, Theron Arista, MD;  Location: [facility] OR;  Service: Open Heart Surgery;  Laterality: N/A;  CRESCENT CANNULA   CANNULATION FOR ECMO (EXTRACORPOREAL MEMBRANE OXYGENATION) N/A 09/10/2019   Procedure: CANNULATION FOR ECMO (EXTRACORPOREAL MEMBRANE OXYGENATION) PUTTING IN CRESCENT 32FR CANNULA  AND REMOVING GROING CANNULATION;  Surgeon: Linden Dolin, MD;  Location: [facility] OR;  Service: Open Heart Surgery;  Laterality: N/A;  PUTTING IN CRESCENT/REMOVING GROIN CANNULATION   CYSTOSCOPY/URETEROSCOPY/HOLMIUM LASER/STENT PLACEMENT Left 04/18/2019   Procedure: LEFT URETEROSCOPY/HOLMIUM LASER/STENT PLACEMENT;  Surgeon: Crist Fat, MD;  Location[name];  Service: Urology;  Laterality: Left;   CYSTOSCOPY/URETEROSCOPY/HOLMIUM LASER/STENT PLACEMENT Left 05/02/2019   Procedure: CYSTOSCOPY/URETEROSCOPY/HOLMIUM LASER/STENT EXCHANGE;  Surgeon: Crist Fat, MD;  Location: [facility];  Service: Urology;  Laterality: Left;   ECMO CANNULATION N/A 08/03/2019   Procedure: ECMO CANNULATION;  Surgeon: Linden Dolin, MD;   Location: [facility];  Service: Cardiovascular;  Laterality: N/A;   ESOPHAGOGASTRODUODENOSCOPY N/A 09/11/2019   Procedure: ESOPHAGOGASTRODUODENOSCOPY (EGD);  Surgeon: Linden Dolin, MD;  Location: [facility] OR;  Service: Thoracic;  Laterality: N/A;   GRAFT APPLICATION Bilateral 03/27/2020   Procedure: APPLICATION OF SKIN GRAFT BILATERAL FEET;  Surgeon: Felecia Shelling, DPM;  Location: [facility];  Service: Podiatry;  Laterality: Bilateral;   IR REPLC GASTRO/COLONIC TUBE PERCUT W/FLUORO  10/14/2019   IRRIGATION AND DEBRIDEMENT SHOULDER Right 09/29/2017   Procedure: IRRIGATION AND DEBRIDEMENT SHOULDER;  Surgeon: Tarry Kos, MD;  Location: [facility] OR;  Service: Orthopedics;  Laterality: Right;   LUMBAR DISC SURGERY  2002   NASAL ENDOSCOPY WITH EPISTAXIS CONTROL N/A 08/31/2019   Procedure: NASAL ENDOSCOPY WITH EPISTAXIS CONTROL WITH CAUTERIZATION;  Surgeon: Christia Reading, MD;  Location: [facility] OR;  Service: ENT;  Laterality: N/A;   PORTACATH PLACEMENT N/A 09/11/2019   Procedure: PEG TUBE INSERTION - BEDSIDE;  Surgeon: Linden Dolin, MD;  Location: [facility] OR;  Service: Thoracic;  Laterality: N/A;   TEE WITHOUT CARDIOVERSION N/A 08/28/2019   Procedure: TRANSESOPHAGEAL ECHOCARDIOGRAM (TEE);  Surgeon: Donata Clay, Theron Arista, MD;  Location: [facility] OR;  Service: Open Heart Surgery;  Laterality: N/A;   TEE WITHOUT CARDIOVERSION N/A 09/10/2019   Procedure: TRANSESOPHAGEAL ECHOCARDIOGRAM (TEE);  Surgeon: Linden Dolin, MD;  Location: [facility] OR;  Service: Open Heart Surgery;  Laterality: N/A;   WOUND DEBRIDEMENT Bilateral 03/27/2020   Procedure: EXCISIONAL DEBRIDEMENT OF ULCERS BILATERAL FEET;  Surgeon: Felecia Shelling, DPM;  Location: [facility];  Service: Podiatry;  Laterality: Bilateral;    Social History: Social History   Socioeconomic History   Marital status: Married    Spouse name: Not on file   Number of children: 1   Years of education: Not on file   Highest education level: Not on file  Occupational History   Occupation:  delivery driver   Occupation: Stay at home spouse  Tobacco Use   Smoking status: Never   Smokeless tobacco: Never  Vaping Use   Vaping status: Never Used  Substance and Sexual Activity   Alcohol use: Yes    Comment: rarely 2-3 times a year   Drug use: No   Sexual activity: Yes    Partners: Female  Other Topics Concern   Not on file  Social History Narrative   Not on file   Social Determinants of Health   Financial Resource Strain: Not on file  Food Insecurity: Not on file  Transportation Needs: Not on file  Physical Activity: Not on file  Stress: Not on file  Social Connections: Not on file  Intimate Partner Violence: Not on file    Family History: Family History  Problem Relation Age of Onset   Migraines Mother    GER disease Mother    Heart disease Father    Hyperlipidemia Father    Diabetes Father    Hypertension Father    Sleep apnea Father    Obesity Father    Asthma Brother    Pancreatic cancer Maternal Grandmother    Heart disease Maternal Grandfather    Diabetes Paternal Grandmother    Hypertension Paternal Grandmother     Review of Systems: Review of Systems  Constitutional: Negative.   Respiratory: Negative.    Cardiovascular: Negative.   Gastrointestinal: Negative.   Neurological: Negative.     Physical Exam: Vital Signs BP 126/75 (BP Location: Left Arm, Patient Position: Sitting, Cuff Size: Large)   Pulse 64   SpO2 92%   Physical Exam  Constitutional:      General: Not in acute distress.    Appearance: Normal appearance. Not ill-appearing.  HENT:     Head: Normocephalic and atraumatic.  Eyes:     Pupils: Pupils are equal, round Neck:     Musculoskeletal: Normal range of motion.  Cardiovascular:     Rate and Rhythm: Normal rate    Pulses: Normal pulses.  Pulmonary:     Effort: Pulmonary effort is normal. No respiratory distress.  Abdominal:     General: Abdomen is flat. There is no distension.  Musculoskeletal: Normal range of  motion.  Skin:    General: Skin is warm and dry.     Findings: No erythema or rash.  Neurological:     General: No focal deficit present.     Mental Status: Alert and oriented to person, place, and time. Mental status is at baseline.     Motor:  No weakness.  Psychiatric:        Mood and Affect: Mood normal.        Behavior: Behavior normal.    Assessment/Plan: The patient is scheduled for left plantar surface wound preparation with placement of myriad wound matrix with Dr. Ladona Ridgel.  Risks, benefits, and alternatives of procedure discussed, questions answered and consent obtained.    Smoking Status: non smoker; Counseling Given? N/a  Caprini Score: 5, moderate; Risk Factors include: Age, BMI > 40, and length of planned surgery. Recommendation for mechanical prophylaxis. Encourage early ambulation.   Pictures obtained: @consult   Post-op Rx sent to pharmacy: Zofran. May need postop Norco, however he is rx norco by Dr Berline Chough with [facility]. Asked patient to discuss pain medicine with Dr. Berline Chough to confirm no problem with [place] rx post-op pain medications. Patient was agreeable with this plan.  Patient was provided with the General Surgical Risk consent document and Pain Medication Agreement prior to their appointment.  They had adequate time to read through the risk consent documents and Pain Medication Agreement. We also discussed them in person together during this preop appointment. All of their questions were answered to their satisfaction.  Recommended calling if they have any further questions.  Risk consent form and Pain Medication Agreement to be scanned into patient's chart.  Electronically signed by: Kermit Balo Angele Wiemann, PA-C 06/20/2023 1:51 PM

## 2023-06-21 ENCOUNTER — Telehealth: Payer: Self-pay

## 2023-06-21 NOTE — Telephone Encounter (Signed)
Refill request

## 2023-06-21 NOTE — Progress Notes (Signed)
[name] ([id number]) [id number].pdf Page 1 of 20 Visit Report for 06/15/2023 Chief Complaint Document Details Patient Name: Date of Service: [name], TO [name] 06/15/2023 11:00 A M Medical Record Number: [id number] Patient Account Number: [id number] Date of Birth/Sex: Treating RN: 03/07/1976 (47 y.o. M) Primary Care Provider: Dorinda Hill Other Clinician: Referring Provider: Treating Provider/Extender: Jana Hakim in Treatment: 152 Information Obtained from: Patient Chief Complaint Bilateral Plantar Foot Ulcers Electronic Signature(s) Signed: 06/15/2023 12:15:05 PM By: [name] Guess MD FACS Entered By: [name] on 06/15/2023 09:15:05 -------------------------------------------------------------------------------- Debridement Details Patient Name: Date of Service: [name], [name] 06/15/2023 11:00 A M Medical Record Number: [id number] Patient Account Number: [id number] Date of Birth/Sex: Treating RN: [birth year] (47 y.o. [name] Primary Care Provider: Dorinda Hill Other Clinician: Referring Provider: Treating Provider/Extender: Jana Hakim in Treatment: 152 Debridement Performed for Assessment: Wound #5 Right,Plantar Foot Performed By: Physician [name] Guess, MD The following information was scribed by: [name] The information was scribed for: [name] Debridement Type: Debridement Level of Consciousness (Pre-procedure): Awake and Alert Pre-procedure Verification/Time Out Yes - 11:50 Taken: Start Time: 11:51 Percent of Wound Bed Debrided: 100% T Area Debrided (cm): otal 0.31 Tissue and other material debrided: Viable, Non-Viable, Callus, Eschar, Slough, Subcutaneous, Skin: Epidermis, Slough Level: Skin/Subcutaneous Tissue Debridement Description: Excisional Instrument: Curette Bleeding: Minimum Hemostasis Achieved: Pressure Response to Treatment: Procedure was  tolerated well Level of Consciousness (Post- Awake and Alert procedure): Post Debridement Measurements of Total Wound Length: (cm) 2 Stage: Category/Stage II Width: (cm) 0.2 Depth: (cm) 0.1 Volume: (cm) 0.031 Character of Wound/Ulcer Post Debridement: Requires Further Debridement Post Procedure Diagnosis [name] ([id number]) [id number] Page 2 of 20 Same as Pre-procedure Electronic Signature(s) Signed: 06/15/2023 2:17:41 PM By: [name] Guess MD FACS Signed: 06/21/2023 2:35:12 PM By: [name] Ard RN Entered By: [name] on 06/15/2023 08:54:56 -------------------------------------------------------------------------------- Debridement Details Patient Name: Date of Service: [name], [name] 06/15/2023 11:00 A M Medical Record Number: [id number] Patient Account Number: [id number] Date of Birth/Sex: Treating RN: [birth year] (47 y.o. [name] Primary Care Provider: Dorinda Hill Other Clinician: Referring Provider: Treating Provider/Extender: Jana Hakim in Treatment: 152 Debridement Performed for Assessment: Wound #2 Left Calcaneus Performed By: Physician [name] Guess, MD Debridement Type: Debridement Level of Consciousness (Pre-procedure): Awake and Alert Pre-procedure Verification/Time Out Yes - 11:50 Taken: Start Time: 11:51 Percent of Wound Bed Debrided: 100% T Area Debrided (cm): otal 6.59 Tissue and other material debrided: Viable, Non-Viable, Callus, Eschar, Slough, Subcutaneous, Skin: Epidermis, Slough Level: Skin/Subcutaneous Tissue Debridement Description: Excisional Instrument: Curette Bleeding: Minimum Hemostasis Achieved: Pressure Response to Treatment: Procedure was tolerated well Level of Consciousness (Post- Awake and Alert procedure): Post Debridement Measurements of Total Wound Length: (cm) 4.2 Stage: Category/Stage III Width: (cm) 2 Depth: (cm) 0.1 Volume: (cm) 0.66 Character  of Wound/Ulcer Post Debridement: Requires Further Debridement Post Procedure Diagnosis Same as Pre-procedure Electronic Signature(s) Signed: 06/15/2023 2:17:41 PM By: [name] Guess MD FACS Signed: 06/21/2023 2:35:12 PM By: [name] Ard RN Entered By: [name] on 06/15/2023 08:57:50 -------------------------------------------------------------------------------- HPI Details Patient Name: Date of Service: [name], [name] 06/15/2023 11:00 A M Medical Record Number: [id number] Patient Account Number: [id number] Date of Birth/Sex: Treating RN: September 15, 1975 (47 y.o. M) Primary Care Provider: Dorinda Hill Other Clinician: Referring Provider: Treating Provider/Extender: Jana Hakim in Treatment: [name], [name] ([id number]) [id number] Page 3 of 20 History of Present Illness HPI Description: Wounds are12/03/2020 upon  evaluation today patient presents for initial inspection here in our clinic concerning issues he has been having with the bottoms of his feet bilaterally. He states these actually occurred as wounds when he was hospitalized for 5 months secondary to Covid. He was apparently with tilting bed where he was in an upright position quite frequently and apparently this occurred in some way shape or form during that time. Fortunately there is no sign of active infection at this time. No fevers, chills, nausea, vomiting, or diarrhea. With that being said he still has substantial wounds on the plantar aspects of his feet Theragen require quite a bit of work to get these to heal. He has been using Santyl currently though that is been problematic both in receiving the medication as well as actually paid for it as it is become quite expensive. Prior to the experience with Covid the patient really did not have any major medical problems other than hypertension he does have some mild generalized weakness following the Covid  experience. 07/22/2020 on evaluation today patient appears to be doing okay in regard to his foot ulcers I feel like the wound beds are showing signs of better improvement that I do believe the Iodoflex is helping in this regard. With that being said he does have a lot of drainage currently and this is somewhat blue/green in nature which is consistent with Pseudomonas. I do think a culture today would be appropriate for [place] to evaluate and see if that is indeed the case I would likely start him on antibiotic orally as well he is not allergic to Cipro knows of no issues he has had in the past 12/21; patient was admitted to the clinic earlier this month with bilateral presumed pressure ulcers on the bottom of his feet apparently related to excessive pressure from a tilt table arrangement in the intensive care unit. Patient relates this to being on ECMO but I am not really sure that is exactly related to that. I must say I have never seen anything like this. He has fairly extensive full-thickness wounds extending from his heel towards his midfoot mostly centered laterally. There is already been some healing distally. He does not appear to have an arterial issue. He has been using gentamicin to the wound surfaces with Iodoflex to help with ongoing debridement 1/6; this is a patient with pressure ulcers on the bottom of his feet related to excessive pressure from a standing position in the intensive care unit. He is complaining of a lot of pain in the right heel. He is not a diabetic. 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 We have been using Iodoflex to help prepare the surfaces of both wounds for an advanced treatment product. He is nonambulatory spending most of his time in a wheelchair I have asked him not to propel the wheelchair with his heels 1/13; in general his wounds look better not much surface area change we have been using Iodoflex as of last week. I did an x-ray of  the right heel as the patient was complaining of pain. I had some thoughts about a stress fracture perhaps Achilles tendon problems however what it showed was erosive changes along the inferior aspect of the calcaneus he now has a MRI booked for 1/20. 1/20; in general his wounds continue to be better. Some improvement in the large narrow areas proximally in his foot. He is still complaining of pain in the right heel and tenderness in certain areas of  this wound. His MRI is tonight. I am not just looking for osteomyelitis that was brought up on the x-ray I am wondering about stress fractures, tendon ruptures etc. He has no such findings on the left. Also noteworthy is that the patient had critical illness neuropathy and some of the discomfort may be actual improvement in nerve function I am just not sure. These wounds were initially in the setting of severe critical illness related to COVID-19. He was put in a standing position. He may have also been on pressors at the point contributing to tissue ischemia. By his description at some point these wounds were grossly necrotic extending proximally up into the Achilles part of his heel. I do not know that I have ever really seen pictures of them like this although they may exist in epic We have ordered Tri layer Oasis. I am trying to stimulate some granulation in these areas. This is of course assuming the MRI is negative for infection 1/27; since the patient was last here he saw Dr. Earlene Plater of infectious disease. He is planned for vancomycin and ceftriaxone. Prior operative culture grew MSSA. Also ordered baseline lab work. He also ordered arterial studies although the ABIs in our clinic were normal as well as his clinical exam these were normal I do not think he needs to see vascular surgery. His ABIs at the [facility] were 1.22 in the right triphasic waveforms with a normal TBI of 1.15 on the left ABI of 1.22 with triphasic waveforms and a normal TBI of  1.08. Finally he saw Dr. Logan Bores who will follow him in for 2 months. At this point I do not think he felt that he needed a procedure on the right calcaneal bone. Dr. Earlene Plater is elected for broad-spectrum antibiotic The patient is still having pain in the right heel. He walks with a walker 2/3; wounds are generally smaller. He is tolerating his IV antibiotics. I believe this is vancomycin and ceftriaxone. We are still waiting for Oasis burn in terms of his out-of-pocket max which he should be meeting soon given the IV antibiotics, MRIs etc. I have asked him to check in on this. We are using silver collagen in the meantime the wounds look better 2/10; tolerating IV vancomycin and Rocephin. We are waiting to apply for Oasis. Although I am not really sure where he is in his out-of-pocket max. 2/17 started the first application of Oasis trilayer. Still on antibiotics. The wounds have generally look better. The area on the left has a little more surface slough requiring debridement 2/24; second application of Oasis trilayer. The wound surface granulation is generally look better. The area on the left with undermining laterally I think is come in a bit. 10/08/2020 upon evaluation today patient is here today for [employer] application #3. Fortunately he seems to be doing extremely well with regard to this and we are seeing a lot of new epithelial growth which is great news. Fortunately there is no signs of active infection at this time. 10/16/2020 upon evaluation today patient appears to be doing well with regard to his foot ulcers. Do believe the Oasis has been of benefit for him. I do not see any signs of infection right now which is great news and I think that he has a lot of new epithelial growth which is great to see as well. The patient is very pleased to hear all of this. I do think we can proceed with the Oasis trilayer #4 today. 3/18;  not as much improvement in these areas on his heels that I was  hoping. I did reapply trilateral Oasis today the tissue looks healthier but not as much fill in as I was hoping. 3/25; better looking today I think this is come in a bit the tissue looks healthier. Triple layer Oasis reapplied #6 4/1; somewhat better looking definitely better looking surface not as much change in surface area as I was hoping. He may be spending more time [place] on days then he needs to although he does have heel offloading boots. Triple layer Oasis reapplied #7 4/7; unfortunately apparently [facility] will not approve any further Oasis which is unfortunate since the patient did respond nicely both in terms of the condition of the wound bed as well as surface area. There is still some drainage coming from the wound but not a lot there does not appear to be any infection 4/15; we have been using Hydrofera Blue. He continues to have nice rims of epithelialization on the right greater than the left. The left the epithelialization is coming from the tip of his heel. There is moderate drainage. In this that concerns me about a total contact cast. There is no evidence of infection 4/29; patient has been using Hydrofera Blue with dressing changes. He has no complaints or issues today. 5/5; using Hydrofera Blue. I actually think that he looks marginally better than the last time I saw this 3 weeks ago. There are rims of epithelialization on the left thumb coming from the medial side on the right. Using Hydrofera Blue 5/12; using Hydrofera Blue. These continue to make improvements in surface area. His drainage was not listed as severe I therefore went ahead and put a cast on the left foot. Right foot we will continue to dress his previous 5/16; back for first total contact cast change. He did not tolerate this particularly well cast injury on the anterior tibia among other issues. Difficulty sleeping. I talked him about this in some detail and afterwards is elected to continue. I  told him I would like to have a cast on for 3 weeks to see if this is going to help at all. I think he agreed 5/19; I think the wound is better. There is no tunneling towards his midfoot. The undermining medially also looks better. He has a rim of new skin distally. I think we are making progress here. The area on the left also continues to look somewhat better to me using Hydrofera Blue. He has a list of complaints about the cast but none of them seem serious 5/26; patient presents for 1 week follow-up. He has been using a total contact cast and tolerating this well. Hydrofera Blue is the main dressing used. He denies signs of infection. 6/2 Hydrofera Blue total contact cast on the left. These were large ulcers that formed in intensive care unit where the patient was recovering from COVID. May have had something to do with being ventilated in an upright positiono Pressors etc. We have been able to get the areas down considerably and a viable SHAMERE, WILHELMI ([id number]) [id number].pdf Page 4 of 20 surface. There is some epithelialization in both sides. Note made of drainage 6/9; changed to [facility] last time because of drainage. He arrives with better looking surfaces and dimensions on the left than the right. Paradoxically the right actually probes more towards his midfoot the left is largely close down but both of these look improved. Using a total  contact cast on the left 6/16; complex wounds on his bilateral plantar heels which were initially pressure injury from a stay in the ICU with COVID. We have been using silver alginate most recently. His dimensions of come in quite dramatically however not recently. We have been putting the left foot in a total contact cast 6/23; complex wounds on the bilateral plantar heels. I been putting the left in the cast paradoxically the area on the right is the one that is going towards closure at a faster rate. Quite a bit of  drainage on the left. The patient went to see Dr. Logan Bores who said he was going to standby for skin grafts. I had actually considered sending him for skin grafts however he would be mandatorily off his feet for a period of weeks to months. I am thinking that the area on the right is going to close on its own the area on the left has been more stubborn even though we have him in a total contact cast 6/30; took him out of a total contact cast last week is the right heel seem to be making better progress than the left where I was placing the cast. We are using silver alginate. Both wounds are smaller right greater than left 7/12; both wounds look as though they are making some progress. We are using silver alginate. Heel offloading boots 7/26; very gradual progress especially on the right. Using silver alginate. He is wearing heel offloading boots 8/18; he continues to close these wounds down very gradually. Using silver alginate. The problem polymen being definitive about this is areas of what appears to be callus around the margins. This is not a 100% of the area but certainly sizable especially on the right 9/1; bilateral plantar feet wounds secondary to prolonged pressure while being ventilated for COVID-19 in an upright position. Essentially pressure ulcers on the bottom of his feet. He is made substantial progress using silver alginate. 9/14; bilateral plantar feet wounds secondary to prolonged pressure. Making progress using silver alginate. 9/29 bilateral plantar feet wounds secondary to prolonged pressure. I changed him to Iodoflex last week. MolecuLight showing reddened blush fluorescence 10/11; patient presents for follow-up. He has no issues or complaints today. He denies signs of infection. He continues to use Iodoflex and antibiotic ointment to the wound beds. 10/27; 2-week follow-up. No evidence of infection. He has callus and thick dry skin around the wound margins we have been using  Iodoflex and Bactroban which was in response to a moderate left MolecuLight reddish blush fluorescence. 11/10; 2-week follow-up. Wound margins again have thick callus however the measurements of the actual wound sites are a lot smaller. Everything looks reasonably healthy here. We have been using Iodoflex He was approved for prime matrix but I have elected to delay this given the improvement in the surface area. Hopefully I will not regret that decision as were getting close to the end of the year in terms of insurance payment 12/8; 2-week follow-up. Wounds are generally smaller in size. These were initially substantial wounds extending into the forefoot all the way into the heel on the bilateral plantar feet. They are now both located on the plantar heel distal aspect both of these have a lot of callus around the wounds I used a #5 curette to remove this on the right and the left also some subcutaneous debris to try and get the wound edges were using Iodoflex. He has heel offloading shoe 12/22; 2-week follow-up. Not really much  improvement. He has thick callus around the outer edges of both wounds. I remove this there is some nonviable subcutaneous tissue as well. We have been using Iodoflex. Her intake nurse and myself spontaneously thought of a total contact cast I went back in May. At that time we really were not seeing much of an improvement with a cast although the wound was in a much different situation I would like to retry this in 2 weeks and I discussed this with the patient 08/12/2021; the patient has had some improvement with the Iodoflex. The the area on the left heel plantar more improved than the right. I had to put him in a total contact cast on the left although I decided to put that off for 2 weeks. I am going to change his primary dressing to silver collagen. I think in both areas he has had some improvement most of the healing seems to be more proximal in the heel. The wounds are in  the mid aspect. A lot of thick callus on the right heel however. 1/19; we are using silver collagen on both plantar heel areas. He has had some improvement today. The left did not require any debridement. He still had some eschar on the right that was debrided but both seem to have contracted. I did not put it total contact cast on him today 2/2 we have been using silver collagen. The area on the right plantar heel has areas that appear to be epithelialized interspersed with dry flaking callus and dry skin. I removed this. This really looks better than on the other side. On the left still a large area with raised edges and debris on the surface. 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 He has no issues or complaints today. 2/9; not much change in the left foot wound with 1 week of a cast we are using silver collagen. Silver collagen on the right side. The right side has been the better wound surface. We will reapply the total contact cast on the left 2/16; not much improvement on either side I been using silver collagen with a total contact cast on the left. I'm changing the Hydrofera Blue still with a total contact cast on the left 2/23; some improvement on both sides. Disappointing that he has thick callus around the area that we are putting in a total contact cast on the left. We've been using Hydrofera Blue on both wound areas. This is a man who at essentially pressure ulcers in addition to ischemia caused by medications to support his blood pressure (pressors) in the ICU. He was being ventilated in the standing position for severe Covid. A Shiley the wounds extended across his entire foot but are now localized to his plantar heels bilaterally. We have made progress however neither areas healed. I continue to think the total contact cast is helped albeit  painstakingly slowly. He has never wanted a plastic surgery consult although I don't know that they would be interested in grafting in area in this location. 10/07/2021: Continued improvement bilaterally. There is still some callus around the left wound, despite the total contact cast. He has some increased pain in his right midfoot around 1 particular area. This has been painful in the past but seems to be a little bit worse. When his cast was removed today, there was an area on  the heel of the left foot that looks a bit macerated. He is also complaining of pain in his left thigh and hip which he thinks is secondary to the limb length discrepancy caused by the cast. 10/14/2021: He continues to improve. A little bit less callus around the left wound. He continues to endorse pain in his right midfoot, but this is not as significant as it was last week. The maceration on his left heel is improved. 10/21/2021: Continued improvement to both wounds. The maceration on his left heel is no longer evident. Less callus bilaterally. Epithelialization progressing. 10/28/2021: Significant improvement this week. The right sided wound is nearly closed with just a small open area at the middle. No maceration seen on the left heel. Continued epithelialization on both sides. No concern for infection. 11/04/2021: T oday, the wounds were measured a little bit differently and come out as larger, but I actually think they are about the same to potentially even smaller, particularly on the left. He continues to accumulate some callus on the right. 11/11/2021: T oday, the patient is expressing some concern that the left wound, despite being in the total contact cast, is not progressing at the same rate as the 1 on the right. He is interested in trying a week without the cast to see how the wound does. The wounds are roughly the same size as last week, with the right perhaps being a little bit smaller. He continues to build up callus  on both sites. 11/18/2021: Last week, I permitted the patient to go without his total contact cast, just to see if the cast was really making any difference. Today, both wounds have deteriorated to some extent, suggesting that the cast is providing benefit, at least on the left. Both are larger and have accumulated callus, slough, and other debris. 11/26/2021: I debrided both wounds quite aggressively last week in an effort to stimulate the healing cascade. This appears to have been effective as the left sided wound is a full centimeter shorter in length. Although the right was measured slightly larger, on inspection, it looks as though an area of epithelialized tissue was included in the measurements. We have been using PolyMem Ag on the wound surfaces with a total contact cast to the left. [name], [name] ([id number]) [phone].pdf Page 5 of 20 12/02/2021: It appears that the intake personnel are including epithelialized tissue in his wound measurements; the right wound is almost completely epithelialized; there is just a crater at the proximal midfoot with some open areas. On the left, he has built up some callus, but the overall wound surface looks good. There is some senescent skin around the wound margin. He has been in PolyMem Ag bilaterally with a total contact cast on the left. 12/09/2021: The right wound is nearly closed; there is just a small open area at the mid calcaneus. On the left, the wound is smaller with minimal callus buildup. No significant drainage. 12/16/2021: The right calcaneal wound remains minimally open at the mid calcaneus; the rest has epithelialized. On the left, the wound is also a little bit smaller. There is some senescent tissue on the periphery. He is getting his first application of a trial skin substitute called Vendaje today. 12/23/2021: The wound on his right calcaneus is nearly closed; there is just a small area at the most distal aspect of  the calcaneus that is open. On the left, the area where we applied to the skin substitute has a healthier-looking bed of granulation  tissue. The wound dimensions are not significantly different on this side but the wound surface is improved. 12/30/2021: The wound on the right calcaneus has not changed significantly aside from some accumulation of callus. On the left, the open area is smaller and continues to have an improved surface. He continues to accumulate callus around the wound. He is here for his third application of Vendaje. 01/06/2022: The right calcaneal wound is down to just a couple of millimeters. He continues to accumulate periwound callus. He unfortunately got his cast wet earlier in the week and his left foot is macerated, resulting in some superficial skin loss just distal to the open wound. The open wound itself, however, is much smaller and has a healthier appearing surface. He is here for his fourth application of Vendaje. 01/13/2022: The right calcaneal wound is about the same. Unfortunately, once again, his cast got wet and his foot is again macerated. This is caused the left calcaneal wound to enlarge. He is here for his fifth application of Vendaje. 01/20/2022: The right calcaneal wound is very small. There is some periwound callus accumulation. He purchased a new cast protector last week and this has been effective in avoiding the maceration that has been occurring on the left. The left calcaneal wound is narrower and has a healthy and viable-appearing surface. He is here for his 6 application of Vendaje. 01/27/2022: The right calcaneal wound is down to just a pinhole. There is some periwound slough and callus. On the left, the wound is narrower and shorter by about a centimeter. The surface is robust and viable-appearing. Unfortunately, the rep for the trial skin substitute product did not provide any for [place] to use today. 02/04/2022: The right calcaneal wound remains unchanged.  There is more accumulated callus. On the left, although the intake nurse measured it a little bit longer, it looks about the same to me. The surface has a layer of slough, but underneath this, there is good granulation tissue. 02/10/2022: The right calcaneus wound is nearly closed. There is still some callus that builds up around the site. The left side looks about the same in terms of dimensions, but the surface is more robust and vital-appearing. 02/16/2022: The area of the right calcaneus that was nearly closed last week has closed, but there is a small opening at the mid foot where it looks like some moisture got retained and caused some reopening. The left foot wound is narrower and shallower. Both sites have a fair amount of periwound callus and eschar. 02/24/2022: The small midfoot opening on the right calcaneus is a little bit smaller today. The left foot wound is narrower and shallower. He continues to accumulate periwound callus. No concern for infection. 03/01/2022: The patient came to clinic early because he showered and got his cast wet. Fortunately, there is no significant maceration to his foot but the callus softened and it looks like the wound on his left calcaneus may be a little bit wider. The wound on his right calcaneus is just a narrow slit. Continued accumulation of periwound callus bilaterally. 03/08/2022: The wound on his right calcaneus is very nearly closed, just a small pinpoint opening under a bit of eschar; the left wound has come in quite a bit, as well. It is narrower and shorter than at our last visit. Still with accumulated callus and eschar bilaterally. 03/17/2022: The right calcaneal wound is healed. The left wound is smaller and the surface itself is very clean, but there is some blue-green  staining on the periwound callus, concerning for Pseudomonas aeruginosa. 03/23/2022: The right calcaneal wound remains closed. The left wound continues to contract. No further blue-green  staining. Small amount of callus and slough accumulation. 03/28/2022: He came in early today because he had gotten his cast wet. On inspection, the wound itself did not get wet or macerated, just a little bit of the forefoot. The wound itself is basically unchanged. 04/07/2022: The right foot wound remains closed. The left wound is the smallest that I have seen it to date. It is narrower and shorter. It still continues to accumulate slough on the surface. 04/15/2022: There is a band of epithelium now dividing the small left plantar foot wound in 2. There is still some slough on the surface. 04/21/2022: The wound continues to narrow. Just a little bit of slough on the surface. He seems to be responding well to endoform. 04/28/2022: Continued slow contraction of the wound. There is a little slough on the surface and some periwound callus. We have been using endoform and total contact cast. 05/05/2022: The wound appears to have stalled. There is slough and some periwound eschar/callus. No concern for infection, however. 05/12/2022: Unfortunately, his right foot has reopened. It is located at the most posterior aspect of his surgical incision. The area was noted to have drainage coming from it when his padding was removed today. Underneath some callus and senescent skin, there is an opening. No purulent drainage or malodor. On the left foot, the wound is again unchanged. There is some light blue staining on the callus, but no malodor or purulent drainage. 10/13; right and left heel remanence of extensive plantar foot wounds. These are better than I remember by quite a big margin however he is still left with wounds on the left plantar heel and the right plantar heel. Been using endoform bilaterally. A culture was done that showed apparently Pseudomonas but we are still waiting for the [facility] antibiotic to use gentamicin today. He is still very active by description I am not sure about the offloading of his  noncasted right foot 10/20; both wounds right and left heel debrided not much change from last week. [name] Echevaria has arrived which is linezolid, gentamicin and ciprofloxacin we will use this with endoform. T contact cast on the left otal 06/02/2022: Both wounds are smaller today. There is still a fair amount of callus buildup around the right foot ulcer. The left is more superficial and nearly flush with the surrounding tissues. Also with slough and eschar buildup. 06/10/2022: The right sided wound appears to be nearly closed, if not completely so, although it is somewhat difficult to tell given the abnormal tissue and scarring in his foot. There is a fair amount of callus and crust accumulation. On the left, the wound looks about the same, again with callus and slough. He has an appointment next Thursday with Dr. Annamary Rummage in podiatry; I am hopeful that there may be some reconstructive options available for Mr. Moffitt. 06/16/2022: Both wounds have some eschar and callus accumulation. The right sided wound is extremely narrow and barely open; the left is narrower than last RIPLEY, STADNIK ([id number]) [phone].pdf Page 6 of 20 week. There is a little bit of slough. He has his appointment in podiatry later today. 06/23/2022: The patient met with Dr. Annamary Rummage last week and unfortunately, there are no reconstructive options that he believes would be helpful. He did order an MRI to evaluate for osteomyelitis and fortunately, none was seen. The left sided  wound is a little bit shorter and narrower today. The right sided wound is about the same. There is callus and eschar accumulation bilaterally. 06/29/2022: Both feet have improved from last week. There is epithelium making a valiant effort to creep across the surface on the left. The right side looks like it got a little dry and the deep crevasse in his midfoot has cracked. Both have eschar and there is some slough on the  left. 07/07/2022: Both feet have improved. There is epithelium completely covering the calcaneus on the right with just a small opening in the crevasse in his midfoot. On the left, the open area of tissue is smaller but he continues to build up callus/eschar and slough. 07/15/2022: The opening in the midfoot on the right is about the same size, covered with eschar and a little bit of slough. The open portion of the left wound is narrower and shorter with a bit of slough buildup. He admits to being on his feet more than recommended. 12/14; as far as I can tell everything on the right foot is closed. There is some eschar I removed some of this I cannot identify any open wound here. As usual this will be a very vulnerable area going forward. On the left this looks really quite healthy. I was pleasantly surprised to see how good this looked. Wound is certainly smaller and there appears to be healthy epithelialization. He has been using Promogran on the right and endoform on the left. He has been offloading the right foot with a heel offloading boot and he has a running shoe on the right foot 08/04/2022: The right foot remains closed. He has a thick cushioned insole in his sneaker. The left sided wound is smaller with just some slough and eschar accumulation. He is wearing the heel off loader on this foot. 08/15/2022: The right foot remains closed. The left wound has narrowed further. There is some slough and eschar accumulation. 08/25/2022: We put him in a peg assist shoe insert and as a result, he has more epithelialization of the ulcer with minimal slough and eschar accumulation. 09/01/2022: The wound is smaller by about half this week. Still with some slough on the surface. The peg assist shoe seems to be doing a remarkable job of adequately offloading the site. 09/08/2022: There is a little bit more epithelium coming in. There is some slough and callus buildup. 09/16/2022: The wound measures about the same  size, but the epithelium that has grown in looks more robust and stronger. There is some slough and callus buildup. 09/23/2022: The wound remains about the same size. The skin edges are looking rather senescent. 09/29/2022: The aggressive debridement I performed last week seems to have been effective. The wound is smaller and has significantly less slough accumulation. 10/06/2022: There has been quite a bit of epithelialization since last week. There are still some open areas with slough accumulation. There is some callus buildup around the perimeter. 10/13/2022: Continued improvement. Minimal callus accumulation. 3/14; patient presents for follow-up. He has been using endoform to the wound bed without issues. He is using a surgical shoe with peg assist for offloading. 10/27/2022: The wound dimensions are stable. There is some senescent skin accumulation around the perimeter. 11/04/2022: Last week I performed a very aggressive debridement in an effort to stimulate the healing cascade. As has been the case when I have done this before, the wound has responded well. There has been epithelialization and contraction of the wound. There are just  a couple of small open areas. 11/10/2022: Unfortunately, his foot got wet secondary to sweating and there has been some breakdown of the tissue, particularly the skin just adjacent to the main ulcer. 11/18/2022: We continue to struggle with moisture-related tissue breakdown around the perimeter of his wound. This has caused the thin epithelium that had formed on the surface to disintegrate. The underlying surface of the wound has good granulation tissue. 11/25/2022: The edges of the wound are much less macerated, but the surface is actually looking a little bit dry. There is slough accumulation. No obvious signs of infection. 12/01/2022: The wound edges are more macerated and broken down, making the wound larger. The surface has some slough on it. 12/08/2020: The wound looks  better this week. There is significantly less maceration and moisture-related tissue breakdown. There is some slough on the wound surface. 12/15/2022: The wound continues to improve. There is almost no moisture-related tissue breakdown. There is epithelium beginning to fill in again from the edges. Light slough on the wound surface. 12/22/2022: More epithelium has filled him from around the edges. There is some slough on the surface. No evidence of moisture-related tissue breakdown. 01/05/2023: There has been more epithelialization, particularly at the posterior calcaneal aspect of the wound. There has been a little bit of moisture accumulation with minimal maceration. 01/12/2023: More epithelium has filled in. There is very minimal accumulation of slough. 01/20/2023: The wound is measuring a little bit smaller today. It did measure deeper, but this appears to be secondary to some heaped up senescent skin around the edges. The epithelium that has been present at the posterior aspect of the wound seems to be a bit more robust with greater integrity. 01/26/2023: He reports intense itching to the skin around the wound and there has been some breakdown here. It looks fungal in nature. The wound itself looks pretty good with improved epithelialization with just some slough buildup. He has a little callus along the wound edges. 02/02/2023: He has been treating the periwound skin with an over-the-counter athlete's foot medication and notes significant improvement in his pruritus. The skin looks better here, as well. The wound continues to epithelialize. There is light slough accumulation. 02/10/2023: The periwound skin still looks fairly angry. He reports multiple small blisters that have opened up. He is not having the intense itching that he had prior, however. There has been more epithelialization at the calcaneus and there is minimal slough accumulation. 02/16/2023: The periwound skin is improving. An online review  of dermatology rash images suggest that this may be hand-foot-and-mouth disease, isolated to the feet; the patient says he actually had this as a child. The calcaneus continues to epithelialize and the tissue was quite robust here. Very minimal slough KIEGAN, MANGINO ([id number]) [phone].pdf Page 7 of 20 accumulation on the open portion of the wound with a little bit of periwound callus buildup. 03/03/2023: The wound measurements are about the same, but there is more epithelium making its way to the surface. There is a little bit of crust and eschar around the edges and minimal slough on the surface. 03/09/2023: His dressing was applied rather haphazardly and the drawtex was not applied. The Optifoam was also reversed such that the orange side was in contact with his bare skin. He has some maceration around the wound edges, but this did not appear to result in any tissue breakdown. There is a little bit of slough on the surface and minimal eschar around the edges. 03/15/2023: The wound measured  narrower today. There is some slough accumulation on the surface and some eschar around the edges. 03/23/2023: The length of the wound remains stable, but it continues to narrow. The skin that has covered the posterior aspect of the calcaneus is more robust and has better tensile integrity. Minimal slough and eschar accumulation. 03/30/2023: The wound length has decreased. This seems to be primarily due to more epithelium covering the posterior aspect of the site. Minimal slough and eschar buildup. 04/07/2023: The wound measured a little bit smaller again today. There is minimal slough accumulation with just a little bit of eschar and callus around the edges. 04/13/2023: The lateral aspect of his wound is starting to epithelialize. There is very minimal slough on the surface. Moisture control is good. 04/20/2023: Continued, albeit extremely slow, encroachment of epithelium as of the wound  surface. Minimal slough. Moisture balance is good. 04/27/2023: More epithelium is filling in from the heel area. There is a little bit of slough on the surface with some periwound callus and eschar. He did note a little bit of drainage coming from his right foot and there is a small crack in the skin extending medially from the main site of his prior ulceration. 05/04/2023: The wounds are stable this week. No significant change. There is a little bit of callus buildup around the edges of each site and some thin slough on the surface on the left foot. 05/11/2023: The skin on the right heel is filling in further. There is a little bit of slough accumulation on both surfaces along with periwound callus accumulation. 05/18/2023: Both wounds are smaller today. The right foot is down to just a sliver of opening. 05/25/2023: Epithelium continues to advance from the calcaneal area towards the midfoot on the left. On the right, there is tiniest crack of an opening that remains. 06/01/2023: No significant change bilaterally. He has built up quite a bit of callus on the calcaneal surface of the right foot. 06/15/2023: He missed his appointment last week. The wounds measured larger today. There is some slough and eschar accumulation on both wounds. He was discussed in the [facility] wound camp last week. The consensus was in favor of proceeding with plastic surgery intervention but they also suggested repeating the MRI imaging of his heels to ensure no osteomyelitis present. Electronic Signature(s) Signed: 06/15/2023 12:16:46 PM By: [name] Guess MD FACS Entered By: [name] on 06/15/2023 09:16:46 -------------------------------------------------------------------------------- Physical Exam Details Patient Name: Date of Service: [name], [name] 06/15/2023 11:00 A M Medical Record Number: [id number] Patient Account Number: [id number] Date of Birth/Sex: Treating RN: Apr 25, 1976 (47 y.o. M) Primary Care  Provider: Dorinda Hill Other Clinician: Referring Provider: Treating Provider/Extender: Jana Hakim in Treatment: 152 Constitutional Hypertensive, asymptomatic. . . . no acute distress. Respiratory Normal work of breathing on room air.. Notes 06/15/2023: The wounds measured larger today. There is some slough and eschar accumulation on both wounds. Electronic Signature(s) Signed: 06/15/2023 12:22:46 PM By: [name] Guess MD FACS Entered By: [name] on 06/15/2023 09:22:46 Morton [name] ([id number]) [id number] Page 8 of 20 -------------------------------------------------------------------------------- Physician Orders Details Patient Name: Date of Service: [name] TO [name] 06/15/2023 11:00 A M Medical Record Number: [id number] Patient Account Number: [id number] Date of Birth/Sex: Treating RN: May 06, 1976 (47 y.o. [name] Primary Care Provider: Dorinda Hill Other Clinician: Referring Provider: Treating Provider/Extender: Jana Hakim in Treatment: [phone] Verbal / Phone Orders: No Diagnosis Coding ICD-10 Coding Code Description L97.512 Non-pressure chronic ulcer  of other part of right foot with fat layer exposed L97.522 Non-pressure chronic ulcer of other part of left foot with fat layer exposed Follow-up Appointments ppointment in 1 week. - Dr. Lady Gary Room 1- Return A Anesthetic Wound #2 Left Calcaneus (In clinic) Topical Lidocaine 4% applied to wound bed - In clinic Bathing/ Shower/ Hygiene May shower and wash wound with soap and water. Off-Loading Other: - keep pressure off of the bottom of your feet. Elevate legs throughout the day. Use the Shoe with the PegAssist off-loading insole Additional Orders / Instructions Follow Nutritious Diet - Try to get 70-100 grams of Protein a day+ Coupons for Juven sent with patient. Other: - 500 mg per day to start, then increase to 500mg  twice a  day. Also consider adding a zinc supplement. Juven Shake 1-2 times daily. Wound Treatment Wound #2 - Calcaneus Wound Laterality: Left Cleanser: Normal Saline (Generic) 1 x Per Day/Other:until healed Discharge Instructions: Cleanse the wound with Normal Saline prior to applying a clean dressing using gauze sponges, not tissue or cotton balls. Cleanser: Wound Cleanser (Generic) 1 x Per Day/Other:until healed Discharge Instructions: Cleanse the wound with wound cleanser prior to applying a clean dressing using gauze sponges, not tissue or cotton balls. Peri-Wound Care: Ketoconazole Cream 2% (Generic) 1 x Per Day/Other:until healed Discharge Instructions: Apply Ketoconazole mixed with zinc to the periwound Peri-Wound Care: Zinc Oxide Ointment 30g tube (Generic) 1 x Per Day/Other:until healed Discharge Instructions: Apply Zinc Oxide to periwound with each dressing change Topical: Gentamicin (Generic) 1 x Per Day/Other:until healed Discharge Instructions: apply to the wound bed Topical: Ketoconazole Cream 2% 1 x Per Day/Other:until healed Discharge Instructions: Apply Ketoconazole to periwound Prim Dressing: Maxorb Extra Ag+ Alginate Dressing, 4x4.75 (in/in) (Generic) 1 x Per Day/Other:until healed ary Discharge Instructions: Apply to wound bed as instructed Secondary Dressing: Drawtex 4x4 in (Generic) 1 x Per Day/Other:until healed Discharge Instructions: Apply over primary dressing as directed. Secondary Dressing: Optifoam Non-Adhesive Dressing, 4x4 in (Generic) 1 x Per Day/Other:until healed Discharge Instructions: Apply over primary dressing as directed. Secondary Dressing: Woven Gauze Sponge, Non-Sterile 4x4 in (Generic) 1 x Per Day/Other:until healed Discharge Instructions: Apply over primary dressing as directed. DEAGLAN, REISER ([id number]) [id number].pdf Page 9 of 20 Secured With: 48M Medipore H Soft Cloth Surgical T ape, 4 x 10 (in/yd) (Generic) 1 x Per  Day/Other:until healed Discharge Instructions: Secure with tape as directed. Compression Wrap: Kerlix Roll 4.5x3.1 (in/yd) (Generic) 1 x Per Day/Other:until healed Discharge Instructions: Apply Kerlix and Coban compression as directed. Add-Ons: [employer] Offloading Boot, Size Regular 1 x Per Day/Other:until healed Wound #5 - Foot Wound Laterality: Plantar, Right Peri-Wound Care: Ketoconazole Cream 2% 1 x Per Day/30 Days Discharge Instructions: Apply Ketoconazole to periwound as needed Peri-Wound Care: Zinc Oxide Ointment 30g tube 1 x Per Day/30 Days Discharge Instructions: Apply Zinc Oxide to periwound with each dressing change Topical: Gentamicin 1 x Per Day/30 Days Discharge Instructions: As directed by physician Prim Dressing: Maxorb Extra Ag+ Alginate Dressing, 4x4.75 (in/in) 1 x Per Day/30 Days ary Discharge Instructions: Apply to wound bed as instructed Secondary Dressing: Drawtex 4x4 in 1 x Per Day/30 Days Discharge Instructions: Apply over primary dressing as directed. Secondary Dressing: Optifoam Non-Adhesive Dressing, 4x4 in 1 x Per Day/30 Days Discharge Instructions: Apply over primary dressing as directed. Secured With: [employer], 4.5x3.1 (in/yd) 1 x Per Day/30 Days Discharge Instructions: Secure with Kerlix as directed. Radiology Magnetic Resonance Imaging (MRI) - Bilateral heels- evaluate for osteomyelitis Electronic Signature(s) Signed: 06/15/2023  2:17:41 PM By: [name] Guess MD FACS Signed: 06/21/2023 2:35:12 PM By: [name] Ard RN Entered By: [name] on 06/15/2023 10:05:59 Prescription 06/15/2023 -------------------------------------------------------------------------------- Kathryne Gin MD Patient Name: Provider: Jul 06, 1976 [id number] Date of Birth: NPI#: [id number] Sex: DEA #: [phone] [id number] Phone #: License #: UPN: Patient Address: [address] Eligha Bridegroom [facility] [place], [place] [address] [place], [place] [address] [phone] Allergies No Known Drug Allergies Provider's Orders Magnetic Resonance Imaging (MRI) - Bilateral heels- evaluate for osteomyelitis Hand Signature: Date(s): Electronic Signature(s) MICHA, VANVLIET ([id number]) [id number] Page 10 of 20 Signed: 06/15/2023 2:17:41 PM By: [name] Guess MD FACS Signed: 06/21/2023 2:35:12 PM By: [name] Ard RN Entered By: [name] on 06/15/2023 10:06:01 -------------------------------------------------------------------------------- Problem List Details Patient Name: Date of Service: [name], [name] 06/15/2023 11:00 A M Medical Record Number: [id number] Patient Account Number: [id number] Date of Birth/Sex: Treating RN: 1976/02/11 (47 y.o. M) Primary Care Provider: Dorinda Hill Other Clinician: Referring Provider: Treating Provider/Extender: Jana Hakim in Treatment: 152 Active Problems ICD-10 Encounter Code Description Active Date MDM Diagnosis L97.512 Non-pressure chronic ulcer of other part of right foot with fat layer exposed 04/27/2023 No Yes L97.522 Non-pressure chronic ulcer of other part of left foot with fat layer exposed 09/03/2020 No Yes Inactive Problems ICD-10 Code Description Active Date Inactive Date L89.893 Pressure ulcer of other site, stage 3 07/15/2020 07/15/2020 M62.81 Muscle weakness (generalized) 07/15/2020 07/15/2020 I10 Essential (primary) hypertension 07/15/2020 07/15/2020 M86.171 Other acute osteomyelitis, right ankle and foot 09/03/2020 09/03/2020 Resolved Problems Electronic Signature(s) Signed: 06/15/2023 12:13:10 PM By: [name] Guess MD FACS Entered By: [name] on 06/15/2023 09:13:09 -------------------------------------------------------------------------------- Progress Note Details Patient Name: Date of Service: [name], [name] 06/15/2023 11:00 A M Medical  Record Number: [id number] Patient Account Number: [id number] Date of Birth/Sex: Treating RN: 08/18/1975 (47 y.o. M) Primary Care Provider: Dorinda Hill Other Clinician: Referring Provider: Treating Provider/Extender: Jana Hakim in Treatment: [address] [place] E ([id number]) [id number] Page 11 of 20 Subjective Chief Complaint Information obtained from Patient Bilateral Plantar Foot Ulcers History of Present Illness (HPI) Wounds are12/03/2020 upon evaluation today patient presents for initial inspection here in our clinic concerning issues he has been having with the bottoms of his feet bilaterally. He states these actually occurred as wounds when he was hospitalized for 5 months secondary to Covid. He was apparently with tilting bed where he was in an upright position quite frequently and apparently this occurred in some way shape or form during that time. Fortunately there is no sign of active infection at this time. No fevers, chills, nausea, vomiting, or diarrhea. With that being said he still has substantial wounds on the plantar aspects of his feet Theragen require quite a bit of work to get these to heal. He has been using Santyl currently though that is been problematic both in receiving the medication as well as actually paid for it as it is become quite expensive. Prior to the experience with Covid the patient really did not have any major medical problems other than hypertension he does have some mild generalized weakness following the Covid experience. 07/22/2020 on evaluation today patient appears to be doing okay in regard to his foot ulcers I feel like the wound beds are showing signs of better improvement that I do believe the Iodoflex is helping in this regard. With that being said he does have a lot  of drainage currently and this is somewhat blue/green in nature which is consistent with Pseudomonas. I do think a culture today  would be appropriate for [place] to evaluate and see if that is indeed the case I would likely start him on antibiotic orally as well he is not allergic to Cipro knows of no issues he has had in the past 12/21; patient was admitted to the clinic earlier this month with bilateral presumed pressure ulcers on the bottom of his feet apparently related to excessive pressure from a tilt table arrangement in the intensive care unit. Patient relates this to being on ECMO but I am not really sure that is exactly related to that. I must say I have never seen anything like this. He has fairly extensive full-thickness wounds extending from his heel towards his midfoot mostly centered laterally. There is already been some healing distally. He does not appear to have an arterial issue. He has been using gentamicin to the wound surfaces with Iodoflex to help with ongoing debridement 1/6; this is a patient with pressure ulcers on the bottom of his feet related to excessive pressure from a standing position in the intensive care unit. He is complaining of a lot of pain in the right heel. He is not a diabetic. 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 We have been using Iodoflex to help prepare the surfaces of both wounds for an advanced treatment product. He is nonambulatory spending most of his time in a wheelchair I have asked him not to propel the wheelchair with his heels 1/13; in general his wounds look better not much surface area change we have been using Iodoflex as of last week. I did an x-ray of the right heel as the patient was complaining of pain. I had some thoughts about a stress fracture perhaps Achilles tendon problems however what it showed was erosive changes along the inferior aspect of the calcaneus he now has a MRI booked for 1/20. 1/20; in general his wounds continue to be better. Some improvement in the large narrow areas proximally in his foot. He is still complaining of pain  in the right heel and tenderness in certain areas of this wound. His MRI is tonight. I am not just looking for osteomyelitis that was brought up on the x-ray I am wondering about stress fractures, tendon ruptures etc. He has no such findings on the left. Also noteworthy is that the patient had critical illness neuropathy and some of the discomfort may be actual improvement in nerve function I am just not sure. These wounds were initially in the setting of severe critical illness related to COVID-19. He was put in a standing position. He may have also been on pressors at the point contributing to tissue ischemia. By his description at some point these wounds were grossly necrotic extending proximally up into the Achilles part of his heel. I do not know that I have ever really seen pictures of them like this although they may exist in epic We have ordered Tri layer Oasis. I am trying to stimulate some granulation in these areas. This is of course assuming the MRI is negative for infection 1/27; since the patient was last here he saw Dr. Earlene Plater of infectious disease. He is planned for vancomycin and ceftriaxone. Prior operative culture grew MSSA. Also ordered baseline lab work. He also ordered arterial studies although the ABIs in our clinic were normal as well as his clinical exam these were normal I  do not think he needs to see vascular surgery. His ABIs at the PTA were 1.22 in the right triphasic waveforms with a normal TBI of 1.15 on the left ABI of 1.22 with triphasic waveforms and a normal TBI of 1.08. Finally he saw Dr. Logan Bores who will follow him in for 2 months. At this point I do not think he felt that he needed a procedure on the right calcaneal bone. Dr. Earlene Plater is elected for broad-spectrum antibiotic The patient is still having pain in the right heel. He walks with a walker 2/3; wounds are generally smaller. He is tolerating his IV antibiotics. I believe this is vancomycin and ceftriaxone.  We are still waiting for Oasis burn in terms of his out-of-pocket max which he should be meeting soon given the IV antibiotics, MRIs etc. I have asked him to check in on this. We are using silver collagen in the meantime the wounds look better 2/10; tolerating IV vancomycin and Rocephin. We are waiting to apply for Oasis. Although I am not really sure where he is in his out-of-pocket max. 2/17 started the first application of Oasis trilayer. Still on antibiotics. The wounds have generally look better. The area on the left has a little more surface slough requiring debridement 2/24; second application of Oasis trilayer. The wound surface granulation is generally look better. The area on the left with undermining laterally I think is come in a bit. 10/08/2020 upon evaluation today patient is here today for [employer] application #3. Fortunately he seems to be doing extremely well with regard to this and we are seeing a lot of new epithelial growth which is great news. Fortunately there is no signs of active infection at this time. 10/16/2020 upon evaluation today patient appears to be doing well with regard to his foot ulcers. Do believe the Oasis has been of benefit for him. I do not see any signs of infection right now which is great news and I think that he has a lot of new epithelial growth which is great to see as well. The patient is very pleased to hear all of this. I do think we can proceed with the Oasis trilayer #4 today. 3/18; not as much improvement in these areas on his heels that I was hoping. I did reapply trilateral Oasis today the tissue looks healthier but not as much fill in as I was hoping. 3/25; better looking today I think this is come in a bit the tissue looks healthier. Triple layer Oasis reapplied #6 4/1; somewhat better looking definitely better looking surface not as much change in surface area as I was hoping. He may be spending more time [place] on days then he needs to  although he does have heel offloading boots. Triple layer Oasis reapplied #7 4/7; unfortunately apparently [facility] will not approve any further Oasis which is unfortunate since the patient did respond nicely both in terms of the condition of the wound bed as well as surface area. There is still some drainage coming from the wound but not a lot there does not appear to be any infection 4/15; we have been using Hydrofera Blue. He continues to have nice rims of epithelialization on the right greater than the left. The left the epithelialization is coming from the tip of his heel. There is moderate drainage. In this that concerns me about a total contact cast. There is no evidence of infection 4/29; patient has been using Hydrofera Blue with dressing changes.  He has no complaints or issues today. 5/5; using Hydrofera Blue. I actually think that he looks marginally better than the last time I saw this 3 weeks ago. There are rims of epithelialization on the left thumb coming from the medial side on the right. Using Hydrofera Blue 5/12; using Hydrofera Blue. These continue to make improvements in surface area. His drainage was not listed as severe I therefore went ahead and put a cast on the left foot. Right foot we will continue to dress his previous 5/16; back for first total contact cast change. He did not tolerate this particularly well cast injury on the anterior tibia among other issues. Difficulty sleeping. I talked him about this in some detail and afterwards is elected to continue. I told him I would like to have a cast on for 3 weeks to see if this is going to help at all. I think he agreed [name], [name] ([id number]) [phone].pdf Page 12 of 20 5/19; I think the wound is better. There is no tunneling towards his midfoot. The undermining medially also looks better. He has a rim of new skin distally. I think we are making progress here. The area on the left  also continues to look somewhat better to me using Hydrofera Blue. He has a list of complaints about the cast but none of them seem serious 5/26; patient presents for 1 week follow-up. He has been using a total contact cast and tolerating this well. Hydrofera Blue is the main dressing used. He denies signs of infection. 6/2 Hydrofera Blue total contact cast on the left. These were large ulcers that formed in intensive care unit where the patient was recovering from COVID. May have had something to do with being ventilated in an upright positiono Pressors etc. We have been able to get the areas down considerably and a viable surface. There is some epithelialization in both sides. Note made of drainage 6/9; changed to [facility] last time because of drainage. He arrives with better looking surfaces and dimensions on the left than the right. Paradoxically the right actually probes more towards his midfoot the left is largely close down but both of these look improved. Using a total contact cast on the left 6/16; complex wounds on his bilateral plantar heels which were initially pressure injury from a stay in the ICU with COVID. We have been using silver alginate most recently. His dimensions of come in quite dramatically however not recently. We have been putting the left foot in a total contact cast 6/23; complex wounds on the bilateral plantar heels. I been putting the left in the cast paradoxically the area on the right is the one that is going towards closure at a faster rate. Quite a bit of drainage on the left. The patient went to see Dr. Logan Bores who said he was going to standby for skin grafts. I had actually considered sending him for skin grafts however he would be mandatorily off his feet for a period of weeks to months. I am thinking that the area on the right is going to close on its own the area on the left has been more stubborn even though we have him in a total contact cast 6/30; took  him out of a total contact cast last week is the right heel seem to be making better progress than the left where I was placing the cast. We are using silver alginate. Both wounds are smaller right greater than left 7/12; both wounds look  as though they are making some progress. We are using silver alginate. Heel offloading boots 7/26; very gradual progress especially on the right. Using silver alginate. He is wearing heel offloading boots 8/18; he continues to close these wounds down very gradually. Using silver alginate. The problem polymen being definitive about this is areas of what appears to be callus around the margins. This is not a 100% of the area but certainly sizable especially on the right 9/1; bilateral plantar feet wounds secondary to prolonged pressure while being ventilated for COVID-19 in an upright position. Essentially pressure ulcers on the bottom of his feet. He is made substantial progress using silver alginate. 9/14; bilateral plantar feet wounds secondary to prolonged pressure. Making progress using silver alginate. 9/29 bilateral plantar feet wounds secondary to prolonged pressure. I changed him to Iodoflex last week. MolecuLight showing reddened blush fluorescence 10/11; patient presents for follow-up. He has no issues or complaints today. He denies signs of infection. He continues to use Iodoflex and antibiotic ointment to the wound beds. 10/27; 2-week follow-up. No evidence of infection. He has callus and thick dry skin around the wound margins we have been using Iodoflex and Bactroban which was in response to a moderate left MolecuLight reddish blush fluorescence. 11/10; 2-week follow-up. Wound margins again have thick callus however the measurements of the actual wound sites are a lot smaller. Everything looks reasonably healthy here. We have been using Iodoflex He was approved for prime matrix but I have elected to delay this given the improvement in the surface area.  Hopefully I will not regret that decision as were getting close to the end of the year in terms of insurance payment 12/8; 2-week follow-up. Wounds are generally smaller in size. These were initially substantial wounds extending into the forefoot all the way into the heel on the bilateral plantar feet. They are now both located on the plantar heel distal aspect both of these have a lot of callus around the wounds I used a #5 curette to remove this on the right and the left also some subcutaneous debris to try and get the wound edges were using Iodoflex. He has heel offloading shoe 12/22; 2-week follow-up. Not really much improvement. He has thick callus around the outer edges of both wounds. I remove this there is some nonviable subcutaneous tissue as well. We have been using Iodoflex. Her intake nurse and myself spontaneously thought of a total contact cast I went back in May. At that time we really were not seeing much of an improvement with a cast although the wound was in a much different situation I would like to retry this in 2 weeks and I discussed this with the patient 08/12/2021; the patient has had some improvement with the Iodoflex. The the area on the left heel plantar more improved than the right. I had to put him in a total contact cast on the left although I decided to put that off for 2 weeks. I am going to change his primary dressing to silver collagen. I think in both areas he has had some improvement most of the healing seems to be more proximal in the heel. The wounds are in the mid aspect. A lot of thick callus on the right heel however. 1/19; we are using silver collagen on both plantar heel areas. He has had some improvement today. The left did not require any debridement. He still had some eschar on the right that was debrided but both seem to have contracted.  I did not put it total contact cast on him today 2/2 we have been using silver collagen. The area on the right plantar  heel has areas that appear to be epithelialized interspersed with dry flaking callus and dry skin. I removed this. This really looks better than on the other side. On the left still a large area with raised edges and debris on the surface. 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 He has no issues or complaints today. 2/9; not much change in the left foot wound with 1 week of a cast we are using silver collagen. Silver collagen on the right side. The right side has been the better wound surface. We will reapply the total contact cast on the left 2/16; not much improvement on either side I been using silver collagen with a total contact cast on the left. I'm changing the Hydrofera Blue still with a total contact cast on the left 2/23; some improvement on both sides. Disappointing that he has thick callus around the area that we are putting in a total contact cast on the left. We've been using Hydrofera Blue on both wound areas. This is a man who at essentially pressure ulcers in addition to ischemia caused by medications to support his blood pressure (pressors) in the ICU. He was being ventilated in the standing position for severe Covid. A Shiley the wounds extended across his entire foot but are now localized to his plantar heels bilaterally. We have made progress however neither areas healed. I continue to think the total contact cast is helped albeit painstakingly slowly. He has never wanted a plastic surgery consult although I don't know that they would be interested in grafting in area in this location. 10/07/2021: Continued improvement bilaterally. There is still some callus around the left wound, despite the total contact cast. He has some increased pain in his right midfoot around 1 particular area. This has been painful in the past but seems to be a little bit  worse. When his cast was removed today, there was an area on the heel of the left foot that looks a bit macerated. He is also complaining of pain in his left thigh and hip which he thinks is secondary to the limb length discrepancy caused by the cast. 10/14/2021: He continues to improve. A little bit less callus around the left wound. He continues to endorse pain in his right midfoot, but this is not as significant as it was last week. The maceration on his left heel is improved. 10/21/2021: Continued improvement to both wounds. The maceration on his left heel is no longer evident. Less callus bilaterally. Epithelialization progressing. 10/28/2021: Significant improvement this week. The right sided wound is nearly closed with just a small open area at the middle. No maceration seen on the left heel. Continued epithelialization on both sides. No concern for infection. 11/04/2021: T oday, the wounds were measured a little bit differently and come out as larger, but I actually think they are about the same to potentially even smaller, particularly on the left. He continues to accumulate some callus on the right. 11/11/2021: T oday, the patient is expressing some concern that the left wound, despite being in the total contact cast, is not progressing at the same rate as the 1 on the right. He is interested in trying a week without the cast  to see how the wound does. The wounds are roughly the same size as last week, with the right perhaps being a little bit smaller. He continues to build up callus on both sites. [name], [name] ([id number]) [phone].pdf Page 13 of 20 11/18/2021: Last week, I permitted the patient to go without his total contact cast, just to see if the cast was really making any difference. Today, both wounds have deteriorated to some extent, suggesting that the cast is providing benefit, at least on the left. Both are larger and have accumulated callus, slough,  and other debris. 11/26/2021: I debrided both wounds quite aggressively last week in an effort to stimulate the healing cascade. This appears to have been effective as the left sided wound is a full centimeter shorter in length. Although the right was measured slightly larger, on inspection, it looks as though an area of epithelialized tissue was included in the measurements. We have been using PolyMem Ag on the wound surfaces with a total contact cast to the left. 12/02/2021: It appears that the intake personnel are including epithelialized tissue in his wound measurements; the right wound is almost completely epithelialized; there is just a crater at the proximal midfoot with some open areas. On the left, he has built up some callus, but the overall wound surface looks good. There is some senescent skin around the wound margin. He has been in PolyMem Ag bilaterally with a total contact cast on the left. 12/09/2021: The right wound is nearly closed; there is just a small open area at the mid calcaneus. On the left, the wound is smaller with minimal callus buildup. No significant drainage. 12/16/2021: The right calcaneal wound remains minimally open at the mid calcaneus; the rest has epithelialized. On the left, the wound is also a little bit smaller. There is some senescent tissue on the periphery. He is getting his first application of a trial skin substitute called Vendaje today. 12/23/2021: The wound on his right calcaneus is nearly closed; there is just a small area at the most distal aspect of the calcaneus that is open. On the left, the area where we applied to the skin substitute has a healthier-looking bed of granulation tissue. The wound dimensions are not significantly different on this side but the wound surface is improved. 12/30/2021: The wound on the right calcaneus has not changed significantly aside from some accumulation of callus. On the left, the open area is smaller and continues to  have an improved surface. He continues to accumulate callus around the wound. He is here for his third application of Vendaje. 01/06/2022: The right calcaneal wound is down to just a couple of millimeters. He continues to accumulate periwound callus. He unfortunately got his cast wet earlier in the week and his left foot is macerated, resulting in some superficial skin loss just distal to the open wound. The open wound itself, however, is much smaller and has a healthier appearing surface. He is here for his fourth application of Vendaje. 01/13/2022: The right calcaneal wound is about the same. Unfortunately, once again, his cast got wet and his foot is again macerated. This is caused the left calcaneal wound to enlarge. He is here for his fifth application of Vendaje. 01/20/2022: The right calcaneal wound is very small. There is some periwound callus accumulation. He purchased a new cast protector last week and this has been effective in avoiding the maceration that has been occurring on the left. The left calcaneal wound is narrower and has  a healthy and viable-appearing surface. He is here for his 6 application of Vendaje. 01/27/2022: The right calcaneal wound is down to just a pinhole. There is some periwound slough and callus. On the left, the wound is narrower and shorter by about a centimeter. The surface is robust and viable-appearing. Unfortunately, the rep for the trial skin substitute product did not provide any for [place] to use today. 02/04/2022: The right calcaneal wound remains unchanged. There is more accumulated callus. On the left, although the intake nurse measured it a little bit longer, it looks about the same to me. The surface has a layer of slough, but underneath this, there is good granulation tissue. 02/10/2022: The right calcaneus wound is nearly closed. There is still some callus that builds up around the site. The left side looks about the same in terms of dimensions, but the surface  is more robust and vital-appearing. 02/16/2022: The area of the right calcaneus that was nearly closed last week has closed, but there is a small opening at the mid foot where it looks like some moisture got retained and caused some reopening. The left foot wound is narrower and shallower. Both sites have a fair amount of periwound callus and eschar. 02/24/2022: The small midfoot opening on the right calcaneus is a little bit smaller today. The left foot wound is narrower and shallower. He continues to accumulate periwound callus. No concern for infection. 03/01/2022: The patient came to clinic early because he showered and got his cast wet. Fortunately, there is no significant maceration to his foot but the callus softened and it looks like the wound on his left calcaneus may be a little bit wider. The wound on his right calcaneus is just a narrow slit. Continued accumulation of periwound callus bilaterally. 03/08/2022: The wound on his right calcaneus is very nearly closed, just a small pinpoint opening under a bit of eschar; the left wound has come in quite a bit, as well. It is narrower and shorter than at our last visit. Still with accumulated callus and eschar bilaterally. 03/17/2022: The right calcaneal wound is healed. The left wound is smaller and the surface itself is very clean, but there is some blue-green staining on the periwound callus, concerning for Pseudomonas aeruginosa. 03/23/2022: The right calcaneal wound remains closed. The left wound continues to contract. No further blue-green staining. Small amount of callus and slough accumulation. 03/28/2022: He came in early today because he had gotten his cast wet. On inspection, the wound itself did not get wet or macerated, just a little bit of the forefoot. The wound itself is basically unchanged. 04/07/2022: The right foot wound remains closed. The left wound is the smallest that I have seen it to date. It is narrower and shorter. It still  continues to accumulate slough on the surface. 04/15/2022: There is a band of epithelium now dividing the small left plantar foot wound in 2. There is still some slough on the surface. 04/21/2022: The wound continues to narrow. Just a little bit of slough on the surface. He seems to be responding well to endoform. 04/28/2022: Continued slow contraction of the wound. There is a little slough on the surface and some periwound callus. We have been using endoform and total contact cast. 05/05/2022: The wound appears to have stalled. There is slough and some periwound eschar/callus. No concern for infection, however. 05/12/2022: Unfortunately, his right foot has reopened. It is located at the most posterior aspect of his surgical incision. The area was  noted to have drainage coming from it when his padding was removed today. Underneath some callus and senescent skin, there is an opening. No purulent drainage or malodor. On the left foot, the wound is again unchanged. There is some light blue staining on the callus, but no malodor or purulent drainage. 10/13; right and left heel remanence of extensive plantar foot wounds. These are better than I remember by quite a big margin however he is still left with wounds on the left plantar heel and the right plantar heel. Been using endoform bilaterally. A culture was done that showed apparently Pseudomonas but we are still waiting for the [facility] antibiotic to use gentamicin today. He is still very active by description I am not sure about the offloading of his noncasted right foot 10/20; both wounds right and left heel debrided not much change from last week. [name] Echevaria has arrived which is linezolid, gentamicin and ciprofloxacin we will use this with endoform. T contact cast on the left otal 06/02/2022: Both wounds are smaller today. There is still a fair amount of callus buildup around the right foot ulcer. The left is more superficial and nearly flush [name], [name] ([id number]) [phone].pdf Page 14 of 20 with the surrounding tissues. Also with slough and eschar buildup. 06/10/2022: The right sided wound appears to be nearly closed, if not completely so, although it is somewhat difficult to tell given the abnormal tissue and scarring in his foot. There is a fair amount of callus and crust accumulation. On the left, the wound looks about the same, again with callus and slough. He has an appointment next Thursday with Dr. Annamary Rummage in podiatry; I am hopeful that there may be some reconstructive options available for Mr. Murano. 06/16/2022: Both wounds have some eschar and callus accumulation. The right sided wound is extremely narrow and barely open; the left is narrower than last week. There is a little bit of slough. He has his appointment in podiatry later today. 06/23/2022: The patient met with Dr. Annamary Rummage last week and unfortunately, there are no reconstructive options that he believes would be helpful. He did order an MRI to evaluate for osteomyelitis and fortunately, none was seen. The left sided wound is a little bit shorter and narrower today. The right sided wound is about the same. There is callus and eschar accumulation bilaterally. 06/29/2022: Both feet have improved from last week. There is epithelium making a valiant effort to creep across the surface on the left. The right side looks like it got a little dry and the deep crevasse in his midfoot has cracked. Both have eschar and there is some slough on the left. 07/07/2022: Both feet have improved. There is epithelium completely covering the calcaneus on the right with just a small opening in the crevasse in his midfoot. On the left, the open area of tissue is smaller but he continues to build up callus/eschar and slough. 07/15/2022: The opening in the midfoot on the right is about the same size, covered with eschar and a little bit of slough. The open portion of the  left wound is narrower and shorter with a bit of slough buildup. He admits to being on his feet more than recommended. 12/14; as far as I can tell everything on the right foot is closed. There is some eschar I removed some of this I cannot identify any open wound here. As usual this will be a very vulnerable area going forward. On the left this looks really quite healthy.  I was pleasantly surprised to see how good this looked. Wound is certainly smaller and there appears to be healthy epithelialization. He has been using Promogran on the right and endoform on the left. He has been offloading the right foot with a heel offloading boot and he has a running shoe on the right foot 08/04/2022: The right foot remains closed. He has a thick cushioned insole in his sneaker. The left sided wound is smaller with just some slough and eschar accumulation. He is wearing the heel off loader on this foot. 08/15/2022: The right foot remains closed. The left wound has narrowed further. There is some slough and eschar accumulation. 08/25/2022: We put him in a peg assist shoe insert and as a result, he has more epithelialization of the ulcer with minimal slough and eschar accumulation. 09/01/2022: The wound is smaller by about half this week. Still with some slough on the surface. The peg assist shoe seems to be doing a remarkable job of adequately offloading the site. 09/08/2022: There is a little bit more epithelium coming in. There is some slough and callus buildup. 09/16/2022: The wound measures about the same size, but the epithelium that has grown in looks more robust and stronger. There is some slough and callus buildup. 09/23/2022: The wound remains about the same size. The skin edges are looking rather senescent. 09/29/2022: The aggressive debridement I performed last week seems to have been effective. The wound is smaller and has significantly less slough accumulation. 10/06/2022: There has been quite a bit of  epithelialization since last week. There are still some open areas with slough accumulation. There is some callus buildup around the perimeter. 10/13/2022: Continued improvement. Minimal callus accumulation. 3/14; patient presents for follow-up. He has been using endoform to the wound bed without issues. He is using a surgical shoe with peg assist for offloading. 10/27/2022: The wound dimensions are stable. There is some senescent skin accumulation around the perimeter. 11/04/2022: Last week I performed a very aggressive debridement in an effort to stimulate the healing cascade. As has been the case when I have done this before, the wound has responded well. There has been epithelialization and contraction of the wound. There are just a couple of small open areas. 11/10/2022: Unfortunately, his foot got wet secondary to sweating and there has been some breakdown of the tissue, particularly the skin just adjacent to the main ulcer. 11/18/2022: We continue to struggle with moisture-related tissue breakdown around the perimeter of his wound. This has caused the thin epithelium that had formed on the surface to disintegrate. The underlying surface of the wound has good granulation tissue. 11/25/2022: The edges of the wound are much less macerated, but the surface is actually looking a little bit dry. There is slough accumulation. No obvious signs of infection. 12/01/2022: The wound edges are more macerated and broken down, making the wound larger. The surface has some slough on it. 12/08/2020: The wound looks better this week. There is significantly less maceration and moisture-related tissue breakdown. There is some slough on the wound surface. 12/15/2022: The wound continues to improve. There is almost no moisture-related tissue breakdown. There is epithelium beginning to fill in again from the edges. Light slough on the wound surface. 12/22/2022: More epithelium has filled him from around the edges. There is some  slough on the surface. No evidence of moisture-related tissue breakdown. 01/05/2023: There has been more epithelialization, particularly at the posterior calcaneal aspect of the wound. There has been a little bit of  moisture accumulation with minimal maceration. 01/12/2023: More epithelium has filled in. There is very minimal accumulation of slough. 01/20/2023: The wound is measuring a little bit smaller today. It did measure deeper, but this appears to be secondary to some heaped up senescent skin around the edges. The epithelium that has been present at the posterior aspect of the wound seems to be a bit more robust with greater integrity. 01/26/2023: He reports intense itching to the skin around the wound and there has been some breakdown here. It looks fungal in nature. The wound itself looks pretty good with improved epithelialization with just some slough buildup. He has a little callus along the wound edges. 02/02/2023: He has been treating the periwound skin with an over-the-counter athlete's foot medication and notes significant improvement in his pruritus. The [name], [name] ([id number]) [phone].pdf Page 15 of 20 skin looks better here, as well. The wound continues to epithelialize. There is light slough accumulation. 02/10/2023: The periwound skin still looks fairly angry. He reports multiple small blisters that have opened up. He is not having the intense itching that he had prior, however. There has been more epithelialization at the calcaneus and there is minimal slough accumulation. 02/16/2023: The periwound skin is improving. An online review of dermatology rash images suggest that this may be hand-foot-and-mouth disease, isolated to the feet; the patient says he actually had this as a child. The calcaneus continues to epithelialize and the tissue was quite robust here. Very minimal slough accumulation on the open portion of the wound with a little bit of periwound  callus buildup. 03/03/2023: The wound measurements are about the same, but there is more epithelium making its way to the surface. There is a little bit of crust and eschar around the edges and minimal slough on the surface. 03/09/2023: His dressing was applied rather haphazardly and the drawtex was not applied. The Optifoam was also reversed such that the orange side was in contact with his bare skin. He has some maceration around the wound edges, but this did not appear to result in any tissue breakdown. There is a little bit of slough on the surface and minimal eschar around the edges. 03/15/2023: The wound measured narrower today. There is some slough accumulation on the surface and some eschar around the edges. 03/23/2023: The length of the wound remains stable, but it continues to narrow. The skin that has covered the posterior aspect of the calcaneus is more robust and has better tensile integrity. Minimal slough and eschar accumulation. 03/30/2023: The wound length has decreased. This seems to be primarily due to more epithelium covering the posterior aspect of the site. Minimal slough and eschar buildup. 04/07/2023: The wound measured a little bit smaller again today. There is minimal slough accumulation with just a little bit of eschar and callus around the edges. 04/13/2023: The lateral aspect of his wound is starting to epithelialize. There is very minimal slough on the surface. Moisture control is good. 04/20/2023: Continued, albeit extremely slow, encroachment of epithelium as of the wound surface. Minimal slough. Moisture balance is good. 04/27/2023: More epithelium is filling in from the heel area. There is a little bit of slough on the surface with some periwound callus and eschar. He did note a little bit of drainage coming from his right foot and there is a small crack in the skin extending medially from the main site of his prior ulceration. 05/04/2023: The wounds are stable this week. No  significant change. There is a little  bit of callus buildup around the edges of each site and some thin slough on the surface on the left foot. 05/11/2023: The skin on the right heel is filling in further. There is a little bit of slough accumulation on both surfaces along with periwound callus accumulation. 05/18/2023: Both wounds are smaller today. The right foot is down to just a sliver of opening. 05/25/2023: Epithelium continues to advance from the calcaneal area towards the midfoot on the left. On the right, there is tiniest crack of an opening that remains. 06/01/2023: No significant change bilaterally. He has built up quite a bit of callus on the calcaneal surface of the right foot. 06/15/2023: He missed his appointment last week. The wounds measured larger today. There is some slough and eschar accumulation on both wounds. He was discussed in the [facility] wound camp last week. The consensus was in favor of proceeding with plastic surgery intervention but they also suggested repeating the MRI imaging of his heels to ensure no osteomyelitis present. Patient History Information obtained from Patient. Family History Cancer - Maternal Grandparents, Diabetes - Father,Paternal Grandparents, Heart Disease - Maternal Grandparents, Hypertension - Father,Paternal Grandparents, Lung Disease - Siblings, No family history of Hereditary Spherocytosis, Kidney Disease, Seizures, Stroke, Thyroid Problems, Tuberculosis. Social History Never smoker, Marital Status - Married, Alcohol Use - Never, Drug Use - No History, Caffeine Use - Daily - tea, soda. Medical History Eyes Denies history of Cataracts, Glaucoma, Optic Neuritis Ear/Nose/Mouth/Throat Denies history of Chronic sinus problems/congestion, Middle ear problems Hematologic/Lymphatic Denies history of Anemia, Hemophilia, Human Immunodeficiency Virus, Lymphedema, Sickle Cell Disease Respiratory Patient has history of Asthma Denies history of  Aspiration, Chronic Obstructive Pulmonary Disease (COPD), Pneumothorax, Sleep Apnea, Tuberculosis Cardiovascular Patient has history of Angina - with COVID, Hypertension Denies history of Arrhythmia, Congestive Heart Failure, Coronary Artery Disease, Deep Vein Thrombosis, Hypotension, Myocardial Infarction, Peripheral Arterial Disease, Peripheral Venous Disease, Phlebitis, Vasculitis Gastrointestinal Denies history of Cirrhosis , Colitis, Crohns, Hepatitis A, Hepatitis B, Hepatitis C Endocrine Denies history of Type I Diabetes, Type II Diabetes Genitourinary Denies history of End Stage Renal Disease Immunological Denies history of Lupus Erythematosus, Raynauds, Scleroderma Integumentary (Skin) Denies history of History of Burn Musculoskeletal Denies history of Gout, Rheumatoid Arthritis, Osteoarthritis, Osteomyelitis Neurologic Denies history of Dementia, Neuropathy, Quadriplegia, Paraplegia, Seizure Disorder Oncologic Denies history of Received Chemotherapy, Received Radiation Psychiatric [name], [name] ([id number]) [id number] Page 16 of 20 Denies history of Anorexia/bulimia, Confinement Anxiety Hospitalization/Surgery History - COVID PNA 07/22/2019- 11/14/2019. - 03/27/2020 wound debridement/ skin graft. Medical A Surgical History Notes nd Constitutional Symptoms (General Health) COVID PNA 07/22/2019-11/14/2019 [facility] ECMO, foot drop left foot , Genitourinary kidney stone Psychiatric anxiety Objective Constitutional Hypertensive, asymptomatic. no acute distress. Vitals Time Taken: 11:23 AM, Height: 69 in, Weight: 280 lbs, BMI: 41.3, Temperature: 98.0 F, Pulse: 80 bpm, Respiratory Rate: 18 breaths/min, Blood Pressure: 151/92 mmHg. Respiratory Normal work of breathing on room air.. General Notes: 06/15/2023: The wounds measured larger today. There is some slough and eschar accumulation on both wounds. Integumentary (Hair, Skin) Wound #2 status is  Open. Original cause of wound was Pressure Injury. The date acquired was: 10/07/2019. The wound has been in treatment 152 weeks. The wound is located on the Left Calcaneus. The wound measures 4.2cm length x 2cm width x 0.1cm depth; 6.597cm^2 area and 0.66cm^3 volume. There is Fat Layer (Subcutaneous Tissue) exposed. There is no tunneling or undermining noted. There is a medium amount of serous drainage noted. The wound margin is distinct with the outline attached  to the wound base. There is large (67-100%) pink granulation within the wound bed. There is a small (1-33%) amount of necrotic tissue within the wound bed including Adherent Slough. The periwound skin appearance had no abnormalities noted for moisture. The periwound skin appearance had no abnormalities noted for color. The periwound skin appearance exhibited: Callus, Scarring. The periwound skin appearance did not exhibit: Crepitus, Excoriation, Induration, Rash. Periwound temperature was noted as No Abnormality. The periwound has tenderness on palpation. Wound #5 status is Open. Original cause of wound was Gradually Appeared. The date acquired was: 04/27/2023. The wound has been in treatment 7 weeks. The wound is located on the Right,Plantar Foot. The wound measures 2cm length x 0.2cm width x 0.2cm depth; 0.314cm^2 area and 0.063cm^3 volume. There is Fat Layer (Subcutaneous Tissue) exposed. There is no tunneling or undermining noted. There is a medium amount of serosanguineous drainage noted. The wound margin is distinct with the outline attached to the wound base. There is large (67-100%) red granulation within the wound bed. There is no necrotic tissue within the wound bed. The periwound skin appearance had no abnormalities noted for color. The periwound skin appearance exhibited: Callus, Scarring, Dry/Scaly. The periwound skin appearance did not exhibit: Rash. Periwound temperature was noted as No Abnormality. The periwound has tenderness  on palpation. 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 There was a Excisional Skin/Subcutaneous Tissue Debridement with a total area of 6.59 sq cm performed by [name] Guess, MD. With the following instrument(s): Curette to remove Viable and Non-Viable tissue/material. Material removed includes Eschar, Callus, Subcutaneous Tissue, Slough, and Skin: Epidermis. No specimens were taken. A time out was conducted at 11:50, prior to the start of the procedure. A Minimum amount of bleeding was controlled with Pressure. The procedure was tolerated well. Post Debridement Measurements: 4.2cm length x 2cm width x 0.1cm depth; 0.66cm^3 volume. Post debridement Stage noted as Category/Stage III. Character of Wound/Ulcer Post Debridement requires further debridement. Post procedure Diagnosis Wound #2: Same as Pre-Procedure Wound #5 Pre-procedure diagnosis of Wound #5 is a Pressure Ulcer located on the Right,Plantar Foot . There was a Excisional Skin/Subcutaneous Tissue Debridement with a total area of 0.31 sq cm performed by [name] Guess, MD. With the following instrument(s): Curette to remove Viable and Non-Viable tissue/material. Material removed includes Eschar, Callus, Subcutaneous Tissue, Slough, and Skin: Epidermis. No specimens were taken. A time out was conducted at 11:50, prior to the start of the procedure. A Minimum amount of bleeding was controlled with Pressure. The procedure was tolerated well. Post Debridement Measurements: 2cm length x 0.2cm width x 0.1cm depth; 0.031cm^3 volume. Post debridement Stage noted as Category/Stage II. Character of Wound/Ulcer Post Debridement requires further debridement. [name], [name] ([id number])  [phone].pdf Page 17 of 20 Post procedure Diagnosis Wound #5: Same as Pre-Procedure Plan Follow-up Appointments: Return Appointment in 1 week. - Dr. Lady Gary Room 1- Anesthetic: Wound #2 Left Calcaneus: (In clinic) Topical Lidocaine 4% applied to wound bed - In clinic Bathing/ Shower/ Hygiene: May shower and wash wound with soap and water. Off-Loading: Other: - keep pressure off of the bottom of your feet. Elevate legs throughout the day. Use the Shoe with the PegAssist off-loading insole Additional Orders / Instructions: Follow Nutritious Diet - Try to get 70-100 grams  of Protein a day+ Coupons for Juven sent with patient. Other: - 500 mg per day to start, then increase to 500mg  twice a day. Also consider adding a zinc supplement. Juven Shake 1-2 times daily. Radiology ordered were: Magnetic Resonance Imaging (MRI) - Bilateral heels- evaluate for osteomyelitis WOUND #2: - Calcaneus Wound Laterality: Left Cleanser: Normal Saline (Generic) 1 x Per Day/Other:until healed Discharge Instructions: Cleanse the wound with Normal Saline prior to applying a clean dressing using gauze sponges, not tissue or cotton balls. Cleanser: Wound Cleanser (Generic) 1 x Per Day/Other:until healed Discharge Instructions: Cleanse the wound with wound cleanser prior to applying a clean dressing using gauze sponges, not tissue or cotton balls. Peri-Wound Care: Ketoconazole Cream 2% (Generic) 1 x Per Day/Other:until healed Discharge Instructions: Apply Ketoconazole mixed with zinc to the periwound Peri-Wound Care: Zinc Oxide Ointment 30g tube (Generic) 1 x Per Day/Other:until healed Discharge Instructions: Apply Zinc Oxide to periwound with each dressing change Topical: Gentamicin (Generic) 1 x Per Day/Other:until healed Discharge Instructions: apply to the wound bed Topical: Ketoconazole Cream 2% 1 x Per Day/Other:until healed Discharge Instructions: Apply Ketoconazole to  periwound Prim Dressing: Maxorb Extra Ag+ Alginate Dressing, 4x4.75 (in/in) (Generic) 1 x Per Day/Other:until healed ary Discharge Instructions: Apply to wound bed as instructed Secondary Dressing: Drawtex 4x4 in (Generic) 1 x Per Day/Other:until healed Discharge Instructions: Apply over primary dressing as directed. Secondary Dressing: Optifoam Non-Adhesive Dressing, 4x4 in (Generic) 1 x Per Day/Other:until healed Discharge Instructions: Apply over primary dressing as directed. Secondary Dressing: Woven Gauze Sponge, Non-Sterile 4x4 in (Generic) 1 x Per Day/Other:until healed Discharge Instructions: Apply over primary dressing as directed. Secured With: 49M Medipore H Soft Cloth Surgical T ape, 4 x 10 (in/yd) (Generic) 1 x Per Day/Other:until healed Discharge Instructions: Secure with tape as directed. Com pression Wrap: Kerlix Roll 4.5x3.1 (in/yd) (Generic) 1 x Per Day/Other:until healed Discharge Instructions: Apply Kerlix and Coban compression as directed. Add-Ons: [employer] Offloading Boot, Size Regular 1 x Per Day/Other:until healed WOUND #5: - Foot Wound Laterality: Plantar, Right Peri-Wound Care: Ketoconazole Cream 2% 1 x Per Day/30 Days Discharge Instructions: Apply Ketoconazole to periwound as needed Peri-Wound Care: Zinc Oxide Ointment 30g tube 1 x Per Day/30 Days Discharge Instructions: Apply Zinc Oxide to periwound with each dressing change Topical: Gentamicin 1 x Per Day/30 Days Discharge Instructions: As directed by physician Prim Dressing: Maxorb Extra Ag+ Alginate Dressing, 4x4.75 (in/in) 1 x Per Day/30 Days ary Discharge Instructions: Apply to wound bed as instructed Secondary Dressing: Drawtex 4x4 in 1 x Per Day/30 Days Discharge Instructions: Apply over primary dressing as directed. Secondary Dressing: Optifoam Non-Adhesive Dressing, 4x4 in 1 x Per Day/30 Days Discharge Instructions: Apply over primary dressing as directed. Secured With: [employer],  4.5x3.1 (in/yd) 1 x Per Day/30 Days Discharge Instructions: Secure with Kerlix as directed. 06/15/2023: He missed his appointment last week. The wounds measured larger today. There is some slough and eschar accumulation on both wounds. He was discussed in the [facility] wound camp last week. The consensus was in favor of proceeding with plastic surgery intervention but they also suggested repeating the MRI imaging of his heels to ensure no osteomyelitis present. I used a curette to debride callus, slough, eschar, and subcutaneous tissue from the both heels. We will continue topical gentamicin, silver alginate, drawtex, and Optifoam donuts. Continue peg assist offloading inserts. We will order the MRIs as suggested by the conference discussion. Follow-up in 1 week. Electronic Signature(s) Signed: 06/15/2023 6:10:25 PM By: [name] Stall RN,  BSN Signed: 06/16/2023 7:56:51 AM By: [name] Guess MD FACS Previous Signature: 06/15/2023 12:24:53 PM Version By: [name] Guess MD FACS Entered By: [name] on 06/15/2023 12:56:31 Morton [name] ([id number]) [id number] Page 18 of 20 -------------------------------------------------------------------------------- HxROS Details Patient Name: Date of Service: [name], TO [name] 06/15/2023 11:00 A M Medical Record Number: [id number] Patient Account Number: [id number] Date of Birth/Sex: Treating RN: 04/01/1976 (47 y.o. M) Primary Care Provider: Dorinda Hill Other Clinician: Referring Provider: Treating Provider/Extender: Jana Hakim in Treatment: 152 Information Obtained From Patient Constitutional Symptoms (General Health) Medical History: Past Medical History Notes: COVID PNA 07/22/2019-11/14/2019 V[facility] ECMO, foot drop left foot , Eyes Medical History: Negative for: Cataracts; Glaucoma; Optic Neuritis Ear/Nose/Mouth/Throat Medical History: Negative for: Chronic sinus problems/congestion; Middle  ear problems Hematologic/Lymphatic Medical History: Negative for: Anemia; Hemophilia; Human Immunodeficiency Virus; Lymphedema; Sickle Cell Disease Respiratory Medical History: Positive for: Asthma Negative for: Aspiration; Chronic Obstructive Pulmonary Disease (COPD); Pneumothorax; Sleep Apnea; Tuberculosis Cardiovascular Medical History: Positive for: Angina - with COVID; Hypertension Negative for: Arrhythmia; Congestive Heart Failure; Coronary Artery Disease; Deep Vein Thrombosis; Hypotension; Myocardial Infarction; Peripheral Arterial Disease; Peripheral Venous Disease; Phlebitis; Vasculitis Gastrointestinal Medical History: Negative for: Cirrhosis ; Colitis; Crohns; Hepatitis A; Hepatitis B; Hepatitis C Endocrine Medical History: Negative for: Type I Diabetes; Type II Diabetes Genitourinary Medical History: Negative for: End Stage Renal Disease Past Medical History Notes: kidney stone Immunological Medical History: Negative for: Lupus Erythematosus; Raynauds; Scleroderma Integumentary (Skin) Medical History: Negative for: History of Burn [name], [name] ([id number]) [id number].pdf Page 19 of 20 Musculoskeletal Medical History: Negative for: Gout; Rheumatoid Arthritis; Osteoarthritis; Osteomyelitis Neurologic Medical History: Negative for: Dementia; Neuropathy; Quadriplegia; Paraplegia; Seizure Disorder Oncologic Medical History: Negative for: Received Chemotherapy; Received Radiation Psychiatric Medical History: Negative for: Anorexia/bulimia; Confinement Anxiety Past Medical History Notes: anxiety Immunizations Pneumococcal Vaccine: Received Pneumococcal Vaccination: No Implantable Devices None Hospitalization / Surgery History Type of Hospitalization/Surgery COVID PNA 07/22/2019- 11/14/2019 03/27/2020 wound debridement/ skin graft Family and Social History Cancer: Yes - Maternal Grandparents; Diabetes: Yes - Father,Paternal  Grandparents; Heart Disease: Yes - Maternal Grandparents; Hereditary Spherocytosis: No; Hypertension: Yes - Father,Paternal Grandparents; Kidney Disease: No; Lung Disease: Yes - Siblings; Seizures: No; Stroke: No; Thyroid Problems: No; Tuberculosis: No; Never smoker; Marital Status - Married; Alcohol Use: Never; Drug Use: No History; Caffeine Use: Daily - tea, soda; Financial Concerns: No; Food, Clothing or Shelter Needs: No; Support System Lacking: No; Transportation Concerns: No Electronic Signature(s) Signed: 06/15/2023 2:17:41 PM By: [name] Guess MD FACS Entered By: [name] on 06/15/2023 09:17:59 -------------------------------------------------------------------------------- SuperBill Details Patient Name: Date of Service: [name], [name] 06/15/2023 Medical Record Number: [id number] Patient Account Number: [id number] Date of Birth/Sex: Treating RN: 1976-02-15 (47 y.o. M) Primary Care Provider: Dorinda Hill Other Clinician: Referring Provider: Treating Provider/Extender: Jana Hakim in Treatment: 152 Diagnosis Coding ICD-10 Codes Code Description [phone] Non-pressure chronic ulcer of other part of right foot with fat layer exposed L97.522 Non-pressure chronic ulcer of other part of left foot with fat layer exposed Facility Procedures : [name], [name] Code: [id number] [facility] ([id number]) Description: 11042 - DEB SUBQ TISSUE 20 SQ CM/< ICD-10 Diagnosis Description [phone] L97.512 Non-pressure chronic ulcer of other part of right foot with fat layer expose L97.522 Non-pressure chronic ulcer of other part of left foot with fat layer  exposed Modifier: [id number] d Quantity: 1 27.pdf Page 20 of 20 Physician Procedures : CPT4 Code Description Modifier [id number] 99214 - WC PHYS LEVEL 4 - EST PT ICD-10  Diagnosis Description L97.512 Non-pressure chronic ulcer of other part of right foot with fat layer exposed L97.522 Non-pressure chronic ulcer of  other part of left foot  with fat layer exposed Quantity: 1 : [id number] 11042 - WC PHYS SUBQ TISS 20 SQ CM ICD-10 Diagnosis Description L97.512 Non-pressure chronic ulcer of other part of right foot with fat layer exposed L97.522 Non-pressure chronic ulcer of other part of left foot with fat layer exposed Quantity: 1 Electronic Signature(s) Signed: 06/15/2023 12:25:49 PM By: [name] Guess MD FACS Entered By: [name] on 06/15/2023 09:25:48

## 2023-06-21 NOTE — Progress Notes (Signed)
[name] ([id number]) [id number] Page 1 of 9 Visit Report for 06/15/2023 Arrival Information Details Patient Name: Date of Service: [name], [name] 06/15/2023 11:00 A M Medical Record Number: [id number] Patient Account Number: [id number] Date of Birth/Sex: Treating RN: 16-Nov-1975 (47 y.o. [name] Primary Care Haifa Hatton: Dorinda Hill Other Clinician: Referring Aretha Levi: Treating Emanuell Morina/Extender: Jana Hakim in Treatment: 152 Visit Information History Since Last Visit Added or deleted any medications: No Patient Arrived: Wheel Chair Any new allergies or adverse reactions: No Arrival Time: 11:22 Had a fall or experienced change in No Accompanied By: wife/ son activities of daily living that may affect Transfer Assistance: None risk of falls: Patient Requires Transmission-Based Precautions: No Signs or symptoms of abuse/neglect since last visito No Patient Has Alerts: No Hospitalized since last visit: No Implantable device outside of the clinic excluding No cellular tissue based products placed in the center since last visit: Has Dressing in Place as Prescribed: Yes Pain Present Now: No Electronic Signature(s) Signed: 06/21/2023 2:35:12 PM By: [name] RN Entered By: [name] on 06/15/2023 08:22:32 -------------------------------------------------------------------------------- Lower Extremity Assessment Details Patient Name: Date of Service: [name], [name] 06/15/2023 11:00 A M Medical Record Number: [id number] Patient Account Number: [id number] Date of Birth/Sex: Treating RN: 26-Feb-1976 (47 y.o. [name] Primary Care Braxton Vantrease: Dorinda Hill Other Clinician: Referring Cordai Rodrigue: Treating Bristol Osentoski/Extender: Jana Hakim in Treatment: 152 Edema Assessment Assessed: [name]: No] Franne Forts: No] Edema: [Left: No] [Right: No] Calf Left: Right: Point of Measurement: 29 cm From  Medial Instep 42 cm 43 cm Ankle Left: Right: Point of Measurement: 9 cm From Medial Instep 24 cm 23 cm Vascular Assessment Pulses: Dorsalis Pedis Palpable: [Left:Yes] [Right:Yes] Extremity colors, hair growth, and conditions: [name], [name] ([id number]) [Right:[id number] Page 2 of 9] Extremity Color: [Left:Normal] Hair Growth on Extremity: [Left:Yes] Temperature of Extremity: [Left:Warm] Capillary Refill: [Left:< 3 seconds] [Right:< 3 seconds] Dependent Rubor: [Left:No No] Electronic Signature(s) Signed: 06/21/2023 2:35:12 PM By: [name] RN Entered By: [name] on 06/15/2023 08:29:48 -------------------------------------------------------------------------------- Multi Wound Chart Details Patient Name: Date of Service: [name], [name] [place] [name] 06/15/2023 11:00 A M Medical Record Number: [id number] Patient Account Number: [id number] Date of Birth/Sex: Treating RN: 1976/05/15 (47 y.o. M) Primary Care Brion Sossamon: Dorinda Hill Other Clinician: Referring Coner Gibbard: Treating Tonnya Garbett/Extender: Jana Hakim in Treatment: 152 Vital Signs Height(in): 69 Pulse(bpm): 80 Weight(lbs): 280 Blood Pressure(mmHg): 151/92 Body Mass Index(BMI): 41.3 Temperature(F): 98.0 Respiratory Rate(breaths/min): 18 [2:Photos:] [5:No Photos] [[name]:[name]] Left Calcaneus Right, Plantar Foot [name] Wound Location: Pressure Injury Gradually Appeared [name] Wounding Event: Pressure Ulcer Pressure Ulcer [name] Primary Etiology: Asthma, Angina, Hypertension Asthma, Angina, Hypertension [name] Comorbid History: 10/07/2019 04/27/2023 [name] Date Acquired: 152 7 [name] Weeks of Treatment: Open Open [name] Wound Status: No No [name] Wound Recurrence: 4.2x2x0.1 2x0.2x0.2 [name] Measurements L x W x D (cm) 6.597 0.314 [name] A (cm) : rea 0.66 0.063 [name] Volume (cm) : 75.70% -912.90% [name] % Reduction in A rea: 97.60% -2000.00% [name] % Reduction in Volume: Category/Stage III Category/Stage  II [name] Classification: Medium Medium [name] Exudate A mount: Serous Serosanguineous [name] Exudate Type: amber red, brown [name] Exudate Color: Distinct, outline attached Distinct, outline attached [name] Wound Margin: Large (67-100%) Large (67-100%) [name] Granulation A mount: Pink Red [name] Granulation Quality: Small (1-33%) None Present (0%) [name] Necrotic A mount: Fat Layer (Subcutaneous Tissue): Yes Fat Layer (Subcutaneous Tissue): Yes [name] Exposed Structures: Fascia: No Tendon: No Muscle: No Joint: No Bone: No  Small (1-33%) Small (1-33%) [name] Epithelialization: Debridement - Excisional Debridement - Excisional [name] Debridement: Pre-procedure Verification/Time Out 11:50 11:50 [name] Taken: Necrotic/Eschar, Callus, Necrotic/Eschar, Callus, [name] Tissue Debrided: [name] ([id number]) [id number] Page 3 of 9 Subcutaneous, Slough Subcutaneous, Slough Skin/Subcutaneous Tissue Skin/Subcutaneous Tissue [name] Level: 6.59 0.31 [name] Debridement A (sq cm): rea Curette Curette [name] Instrument: Minimum Minimum [name] Bleeding: Pressure Pressure [name] Hemostasis Achieved: Procedure was tolerated well Procedure was tolerated well [name] Debridement Treatment Response: 4.2x2x0.1 2x0.2x0.1 [name] Post Debridement Measurements L x W x D (cm) 0.66 0.031 [name] Post Debridement Volume: (cm) Category/Stage III Category/Stage II [name] Post Debridement Stage: Callus: Yes Callus: Yes [name] Periwound Skin Texture: Scarring: Yes Scarring: Yes Excoriation: No Rash: No Induration: No Crepitus: No Rash: No Dry/Scaly: Yes Dry/Scaly: Yes [name] Periwound Skin Moisture: Maceration: No Atrophie Blanche: No Hemosiderin Staining: No [name] Periwound Skin Color: Cyanosis: No Ecchymosis: No Erythema: No Hemosiderin Staining: No Mottled: No Pallor: No Rubor: No No Abnormality No Abnormality [name] Temperature: Yes Yes [name] Tenderness on Palpation: Debridement Debridement [name] Procedures  Performed: Treatment Notes Wound #2 (Calcaneus) Wound Laterality: Left Cleanser Normal Saline Discharge Instruction: Cleanse the wound with Normal Saline prior [name] applying a clean dressing using gauze sponges, not tissue or cotton balls. Wound Cleanser Discharge Instruction: Cleanse the wound with wound cleanser prior [name] applying a clean dressing using gauze sponges, not tissue or cotton balls. Peri-Wound Care Ketoconazole Cream 2% Discharge Instruction: Apply Ketoconazole mixed with zinc [name] the periwound Zinc Oxide Ointment 30g tube Discharge Instruction: Apply Zinc Oxide [name] periwound with each dressing change Topical Gentamicin Discharge Instruction: apply [name] the wound bed Primary Dressing Maxorb Extra Ag+ Alginate Dressing, 4x4.75 (in/in) Discharge Instruction: Apply [name] wound bed as instructed Secondary Dressing Drawtex 4x4 in Discharge Instruction: Apply over primary dressing as directed. Optifoam Non-Adhesive Dressing, 4x4 in Discharge Instruction: Apply over primary dressing as directed. Woven Gauze Sponge, Non-Sterile 4x4 in Discharge Instruction: Apply over primary dressing as directed. Secured With 36M Medipore H Soft Cloth Surgical T ape, 4 x 10 (in/yd) Discharge Instruction: Secure with tape as directed. Compression Wrap Kerlix Roll 4.5x3.1 (in/yd) Discharge Instruction: Apply Kerlix and Coban compression as directed. Compression Stockings Add-Ons [employer] Offloading Boot, Size Regular Wound #5 (Foot) Wound Laterality: Plantar, Right [name], [name] ([id number]) [id number].pdf Page 4 of 9 Cleanser Peri-Wound Care Ketoconazole Cream 2% Discharge Instruction: Apply Ketoconazole [name] periwound as needed Zinc Oxide Ointment 30g tube Discharge Instruction: Apply Zinc Oxide [name] periwound with each dressing change Topical Gentamicin Discharge Instruction: As directed by physician Primary Dressing Maxorb Extra Ag+ Alginate Dressing, 4x4.75  (in/in) Discharge Instruction: Apply [name] wound bed as instructed Secondary Dressing Drawtex 4x4 in Discharge Instruction: Apply over primary dressing as directed. Optifoam Non-Adhesive Dressing, 4x4 in Discharge Instruction: Apply over primary dressing as directed. Secured With [employer], 4.5x3.1 (in/yd) Discharge Instruction: Secure with Kerlix as directed. Compression Wrap Compression Stockings Add-Ons Electronic Signature(s) Signed: 06/15/2023 12:13:20 PM By: [name] MD FACS Entered By: [name] on 06/15/2023 09:13:20 -------------------------------------------------------------------------------- Multi-Disciplinary Care Plan Details Patient Name: Date of Service: [name], [name] 06/15/2023 11:00 A M Medical Record Number: [id number] Patient Account Number: [id number] Date of Birth/Sex: Treating RN: [birth year] (47 y.o. [name] Primary Care Khamauri Bauernfeind: Dorinda Hill Other Clinician: Referring Taner Rzepka: Treating Mazey Mantell/Extender: Jana Hakim in Treatment: 152 Multidisciplinary Care Plan reviewed with physician Active Inactive Wound/Skin Impairment Nursing Diagnoses: Impaired tissue integrity Knowledge deficit related [name] ulceration/compromised skin integrity Goals: Patient/caregiver will verbalize understanding  of skin care regimen Date Initiated: 07/15/2020 Target Resolution Date: 06/29/2023 Goal Status: Active Ulcer/skin breakdown will have a volume reduction of 30% by week 4 Date Initiated: 07/15/2020 Date Inactivated: 08/20/2020 Target Resolution Date: 09/03/2020 Goal Status: Unmet Unmet Reason: no major changes. Ulcer/skin breakdown will heal within 14 weeks Date Initiated: 12/04/2020 Date Inactivated: 12/10/2020 Target Resolution Date: 12/10/2020 [name], [name] ([id number]) [id number] Page 5 of 9 Unmet Reason: wounds still open at 14 Goal Status: Unmet weeks and today 21  weeks. Interventions: Assess patient/caregiver ability [name] obtain necessary supplies Assess patient/caregiver ability [name] perform ulcer/skin care regimen upon admission and as needed Assess ulceration(s) every visit Provide education on ulcer and skin care Treatment Activities: Skin care regimen initiated : 07/15/2020 Topical wound management initiated : 07/15/2020 Notes: Electronic Signature(s) Signed: 06/21/2023 2:35:12 PM By: [name] RN Entered By: [name] on 06/15/2023 09:09:38 -------------------------------------------------------------------------------- Pain Assessment Details Patient Name: Date of Service: [name], [name] [place] [name] 06/15/2023 11:00 A M Medical Record Number: [id number] Patient Account Number: [id number] Date of Birth/Sex: Treating RN: 1976/02/24 (47 y.o. [name] Primary Care Kayonna Lawniczak: Dorinda Hill Other Clinician: Referring Atziri Zubiate: Treating Emerald Shor/Extender: Jana Hakim in Treatment: 152 Active Problems Location of Pain Severity and Description of Pain Patient Has Paino No Site Locations Pain Management and Medication Current Pain Management: Electronic Signature(s) Signed: 06/21/2023 2:35:12 PM By: [name] RN Entered By: [name] on 06/15/2023 08:29:27 [name] ([id number]) [id number] Page 6 of 9 -------------------------------------------------------------------------------- Patient/Caregiver Education Details Patient Name: Date of Service: [name] 11/7/2024andnbsp11:00 A M Medical Record Number: [id number] Patient Account Number: [id number] Date of Birth/Gender: Treating RN: April 17, 1976 (47 y.o. [name] Primary Care Physician: Dorinda Hill Other Clinician: Referring Physician: Treating Physician/Extender: Jana Hakim in Treatment: 152 Education Assessment Education Provided [name]: Patient Education Topics Provided Wound  Debridement: Methods: Explain/Verbal Responses: Reinforcements needed, State content correctly Wound/Skin Impairment: Methods: Explain/Verbal Responses: Reinforcements needed, State content correctly Electronic Signature(s) Signed: 06/21/2023 2:35:12 PM By: [name] RN Entered By: [name] on 06/15/2023 08:50:02 -------------------------------------------------------------------------------- Wound Assessment Details Patient Name: Date of Service: [name], [name] [place] [name] 06/15/2023 11:00 A M Medical Record Number: [id number] Patient Account Number: [id number] Date of Birth/Sex: Treating RN: [birth year] (47 y.o. [name] Primary Care Skanda Worlds: Dorinda Hill Other Clinician: Referring Ysabelle Goodroe: Treating Mcihael Hinderman/Extender: Jana Hakim in Treatment: 152 Wound Status Wound Number: 2 Primary Etiology: Pressure Ulcer Wound Location: Left Calcaneus Wound Status: Open Wounding Event: Pressure Injury Comorbid History: Asthma, Angina, Hypertension Date Acquired: 10/07/2019 Weeks Of Treatment: 152 Clustered Wound: No Photos Wound Measurements Length: (cm) 4. Width: (cm) 2 Depth: (cm) 0. Area: (cm) 6 [name], [name] ([id number]) Volume: (cm) 2 % Reduction in Area: 75.7% % Reduction in Volume: 97.6% 1 Epithelialization: Small (1-33%) .597 Tunneling: No [id number].pdf Page 7 of 9 0.66 Undermining: No Wound Description Classification: Category/Stage III Wound Margin: Distinct, outline attached Exudate Amount: Medium Exudate Type: Serous Exudate Color: amber Foul Odor After Cleansing: No Slough/Fibrino No Wound Bed Granulation Amount: Large (67-100%) Exposed Structure Granulation Quality: Pink Fascia Exposed: No Necrotic Amount: Small (1-33%) Fat Layer (Subcutaneous Tissue) Exposed: Yes Necrotic Quality: Adherent Slough Tendon Exposed: No Muscle Exposed: No Joint Exposed: No Bone Exposed: No Periwound Skin  Texture Texture Color No Abnormalities Noted: No No Abnormalities Noted: Yes Callus: Yes Temperature / Pain Crepitus: No Temperature: No Abnormality Excoriation: No Tenderness on Palpation: Yes Induration: No Rash: No Scarring: Yes Moisture No Abnormalities  Noted: Yes Treatment Notes Wound #2 (Calcaneus) Wound Laterality: Left Cleanser Normal Saline Discharge Instruction: Cleanse the wound with Normal Saline prior [name] applying a clean dressing using gauze sponges, not tissue or cotton balls. Wound Cleanser Discharge Instruction: Cleanse the wound with wound cleanser prior [name] applying a clean dressing using gauze sponges, not tissue or cotton balls. Peri-Wound Care Ketoconazole Cream 2% Discharge Instruction: Apply Ketoconazole mixed with zinc [name] the periwound Zinc Oxide Ointment 30g tube Discharge Instruction: Apply Zinc Oxide [name] periwound with each dressing change Topical Gentamicin Discharge Instruction: apply [name] the wound bed Primary Dressing Maxorb Extra Ag+ Alginate Dressing, 4x4.75 (in/in) Discharge Instruction: Apply [name] wound bed as instructed Secondary Dressing Drawtex 4x4 in Discharge Instruction: Apply over primary dressing as directed. Optifoam Non-Adhesive Dressing, 4x4 in Discharge Instruction: Apply over primary dressing as directed. Woven Gauze Sponge, Non-Sterile 4x4 in Discharge Instruction: Apply over primary dressing as directed. Secured With 4M Medipore H Soft Cloth Surgical T ape, 4 x 10 (in/yd) Discharge Instruction: Secure with tape as directed. Compression Wrap Kerlix Roll 4.5x3.1 (in/yd) Discharge Instruction: Apply Kerlix and Coban compression as directed. Compression Stockings [name], [name] ([id number]) [id number].pdf Page 8 of 9 Add-Ons [employer] Offloading Boot, Size Regular Electronic Signature(s) Signed: 06/21/2023 2:35:12 PM By: [name] RN Entered By: [name] on 06/15/2023  08:34:50 -------------------------------------------------------------------------------- Wound Assessment Details Patient Name: Date of Service: [name], [name] 06/15/2023 11:00 A M Medical Record Number: [id number] Patient Account Number: [id number] Date of Birth/Sex: Treating RN: [birth year] (47 y.o. [name] Primary Care Amaura Authier: Dorinda Hill Other Clinician: Referring Daphnie Venturini: Treating Griffon Herberg/Extender: Jana Hakim in Treatment: 152 Wound Status Wound Number: 5 Primary Etiology: Pressure Ulcer Wound Location: Right, Plantar Foot Wound Status: Open Wounding Event: Gradually Appeared Comorbid History: Asthma, Angina, Hypertension Date Acquired: 04/27/2023 Weeks Of Treatment: 7 Clustered Wound: No Wound Measurements Length: (cm) 2 Width: (cm) 0.2 Depth: (cm) 0.2 Area: (cm) 0.314 Volume: (cm) 0.063 % Reduction in Area: -912.9% % Reduction in Volume: -2000% Epithelialization: Small (1-33%) Tunneling: No Undermining: No Wound Description Classification: Category/Stage II Wound Margin: Distinct, outline attached Exudate Amount: Medium Exudate Type: Serosanguineous Exudate Color: red, brown Foul Odor After Cleansing: No Slough/Fibrino Yes Wound Bed Granulation Amount: Large (67-100%) Exposed Structure Granulation Quality: Red Fat Layer (Subcutaneous Tissue) Exposed: Yes Necrotic Amount: None Present (0%) Periwound Skin Texture Texture Color No Abnormalities Noted: No No Abnormalities Noted: Yes Callus: Yes Temperature / Pain Rash: No Temperature: No Abnormality Scarring: Yes Tenderness on Palpation: Yes Moisture No Abnormalities Noted: No Dry / Scaly: Yes Treatment Notes Wound #5 (Foot) Wound Laterality: Plantar, Right Cleanser Peri-Wound Care Ketoconazole Cream 2% Discharge Instruction: Apply Ketoconazole [name] periwound as needed Zinc Oxide Ointment 30g tube Discharge Instruction: Apply Zinc Oxide [name] periwound with each  dressing change BUZZ, VITULLI ([id number]) [id number] Page 9 of 9 Topical Gentamicin Discharge Instruction: As directed by physician Primary Dressing Maxorb Extra Ag+ Alginate Dressing, 4x4.75 (in/in) Discharge Instruction: Apply [name] wound bed as instructed Secondary Dressing Drawtex 4x4 in Discharge Instruction: Apply over primary dressing as directed. Optifoam Non-Adhesive Dressing, 4x4 in Discharge Instruction: Apply over primary dressing as directed. Secured With [employer], 4.5x3.1 (in/yd) Discharge Instruction: Secure with Kerlix as directed. Compression Wrap Compression Stockings Add-Ons Electronic Signature(s) Signed: 06/21/2023 2:35:12 PM By: [name] RN Entered By: [name] on 06/15/2023 08:35:19 -------------------------------------------------------------------------------- Vitals Details Patient Name: Date of Service: [name], [name] [place] [name] 06/15/2023 11:00 A M Medical Record Number: [id number] Patient Account  Number: [id number] Date of Birth/Sex: Treating RN: 1975/12/21 (47 y.o. [name] Primary Care Thaine Garriga: Dorinda Hill Other Clinician: Referring Stacye Noori: Treating Agnes Probert/Extender: Jana Hakim in Treatment: 152 Vital Signs Time Taken: 11:23 Temperature (F): 98.0 Height (in): 69 Pulse (bpm): 80 Weight (lbs): 280 Respiratory Rate (breaths/min): 18 Body Mass Index (BMI): 41.3 Blood Pressure (mmHg): 151/92 Reference Range: 80 - 120 mg / dl Electronic Signature(s) Signed: 06/21/2023 2:35:12 PM By: [name] RN Entered By: [name] on 06/15/2023 08:24:42

## 2023-06-22 ENCOUNTER — Encounter (HOSPITAL_BASED_OUTPATIENT_CLINIC_OR_DEPARTMENT_OTHER): Payer: PPO | Admitting: General Surgery

## 2023-06-22 DIAGNOSIS — L89623 Pressure ulcer of left heel, stage 3: Secondary | ICD-10-CM | POA: Diagnosis not present

## 2023-06-22 DIAGNOSIS — L97512 Non-pressure chronic ulcer of other part of right foot with fat layer exposed: Secondary | ICD-10-CM | POA: Diagnosis not present

## 2023-06-22 DIAGNOSIS — L89892 Pressure ulcer of other site, stage 2: Secondary | ICD-10-CM | POA: Diagnosis not present

## 2023-06-22 MED ORDER — HYDROCODONE-ACETAMINOPHEN 5-325 MG PO TABS: 1.0000 | ORAL_TABLET | Freq: Two times a day (BID) | ORAL | 0 refills | Status: AC | PRN

## 2023-06-22 NOTE — Progress Notes (Signed)
TILLMON, ENTRIKIN ([id number]) [id number] Page 1 of 19 Visit Report for 06/22/2023 Chief Complaint Document Details Patient Name: Date of Service: [name], TO [name] 06/22/2023 10:45 A M Medical Record Number: [id number] Patient Account Number: [id number] Date of Birth/Sex: Treating RN: [birth year] (47 y.o. M) Primary Care Provider: Dorinda Hill Other Clinician: Referring Provider: Treating Provider/Extender: Jana Hakim in Treatment: 153 Information Obtained from: Patient Chief Complaint Bilateral Plantar Foot Ulcers Electronic Signature(s) Signed: 06/22/2023 11:07:42 AM By: [name] MD FACS Entered By: [name] on 06/22/2023 08:07:42 -------------------------------------------------------------------------------- Debridement Details Patient Name: Date of Service: [name], [name] 06/22/2023 10:45 A M Medical Record Number: [id number] Patient Account Number: [id number] Date of Birth/Sex: Treating RN: 02-04-1976 (47 y.o. [name] Primary Care Provider: Dorinda Hill Other Clinician: Referring Provider: Treating Provider/Extender: Jana Hakim in Treatment: 153 Debridement Performed for Assessment: Wound #2 Left Calcaneus Performed By: Physician [name], MD The following information was scribed by: Zenaida Deed The information was scribed for: [name] Debridement Type: Debridement Level of Consciousness (Pre-procedure): Awake and Alert Pre-procedure Verification/Time Out Yes - 10:55 Taken: Start Time: 10:58 Pain Control: Lidocaine 4% T opical Solution Percent of Wound Bed Debrided: 100% T Area Debrided (cm): otal 3.3 Tissue and other material debrided: Viable, Non-Viable, Slough, Subcutaneous, Slough Level: Skin/Subcutaneous Tissue Debridement Description: Excisional Instrument: Curette Bleeding: Minimum Hemostasis Achieved: Pressure Response to Treatment:  Procedure was tolerated well Level of Consciousness (Post- Awake and Alert procedure): Post Debridement Measurements of Total Wound Length: (cm) 3 Stage: Category/Stage III Width: (cm) 1.4 Depth: (cm) 0.1 Volume: (cm) 0.33 Character of Wound/Ulcer Post Debridement: Improved Post Procedure Diagnosis VERDUN, ACKERMANN ([id number]) [id number] Page 2 of 19 Same as Pre-procedure Electronic Signature(s) Signed: 06/22/2023 3:32:46 PM By: [name] MD FACS Signed: 06/22/2023 4:32:10 PM By: Zenaida Deed RN, BSN Entered By: Zenaida Deed on 06/22/2023 07:59:17 -------------------------------------------------------------------------------- Debridement Details Patient Name: Date of Service: [name], [name] 06/22/2023 10:45 A M Medical Record Number: [id number] Patient Account Number: [id number] Date of Birth/Sex: Treating RN: 1975/11/27 (47 y.o. [name] Primary Care Provider: Dorinda Hill Other Clinician: Referring Provider: Treating Provider/Extender: Jana Hakim in Treatment: 153 Debridement Performed for Assessment: Wound #5 Right,Plantar Foot Performed By: Physician [name], MD Debridement Type: Debridement Level of Consciousness (Pre-procedure): Awake and Alert Pre-procedure Verification/Time Out Yes - 10:55 Taken: Start Time: 10:58 Pain Control: Lidocaine 4% T opical Solution Percent of Wound Bed Debrided: 100% T Area Debrided (cm): otal 0.26 Tissue and other material debrided: Viable, Non-Viable, Slough, Subcutaneous, Slough Level: Skin/Subcutaneous Tissue Debridement Description: Excisional Instrument: Curette Bleeding: Minimum Hemostasis Achieved: Pressure Procedural Pain: 0 Post Procedural Pain: 0 Response to Treatment: Procedure was tolerated well Level of Consciousness (Post- Awake and Alert procedure): Post Debridement Measurements of Total Wound Length: (cm) 1.1 Stage:  Category/Stage II Width: (cm) 0.3 Depth: (cm) 0.1 Volume: (cm) 0.026 Character of Wound/Ulcer Post Debridement: Improved Post Procedure Diagnosis Same as Pre-procedure Electronic Signature(s) Signed: 06/22/2023 3:32:46 PM By: [name] MD FACS Signed: 06/22/2023 4:32:10 PM By: Zenaida Deed RN, BSN Entered By: Zenaida Deed on 06/22/2023 08:00:29 -------------------------------------------------------------------------------- HPI Details Patient Name: Date of Service: [name], [name] 06/22/2023 10:45 A M Medical Record Number: [id number] Patient Account Number: [id number] Date of Birth/Sex: Treating RN: Oct 23, 1975 (47 y.o. M) Primary Care Provider: Dorinda Hill Other Clinician: [name] ([id number]) [id number] Page 3 of 19 Referring Provider: Treating Provider/Extender: Jana Hakim in  Treatment: 153 History of Present Illness HPI Description: Wounds are12/03/2020 upon evaluation today patient presents for initial inspection here in our clinic concerning issues he has been having with the bottoms of his feet bilaterally. He states these actually occurred as wounds when he was hospitalized for 5 months secondary to Covid. He was apparently with tilting bed where he was in an upright position quite frequently and apparently this occurred in some way shape or form during that time. Fortunately there is no sign of active infection at this time. No fevers, chills, nausea, vomiting, or diarrhea. With that being said he still has substantial wounds on the plantar aspects of his feet Theragen require quite a bit of work to get these to heal. He has been using Santyl currently though that is been problematic both in receiving the medication as well as actually paid for it as it is become quite expensive. Prior to the experience with Covid the patient really did not have any major medical problems other than hypertension he does  have some mild generalized weakness following the Covid experience. 07/22/2020 on evaluation today patient appears to be doing okay in regard to his foot ulcers I feel like the wound beds are showing signs of better improvement that I do believe the Iodoflex is helping in this regard. With that being said he does have a lot of drainage currently and this is somewhat blue/green in nature which is consistent with Pseudomonas. I do think a culture today would be appropriate for [place] to evaluate and see if that is indeed the case I would likely start him on antibiotic orally as well he is not allergic to Cipro knows of no issues he has had in the past 12/21; patient was admitted to the clinic earlier this month with bilateral presumed pressure ulcers on the bottom of his feet apparently related to excessive pressure from a tilt table arrangement in the intensive care unit. Patient relates this to being on ECMO but I am not really sure that is exactly related to that. I must say I have never seen anything like this. He has fairly extensive full-thickness wounds extending from his heel towards his midfoot mostly centered laterally. There is already been some healing distally. He does not appear to have an arterial issue. He has been using gentamicin to the wound surfaces with Iodoflex to help with ongoing debridement 1/6; this is a patient with pressure ulcers on the bottom of his feet related to excessive pressure from a standing position in the intensive care unit. He is complaining of a lot of pain in the right heel. He is not a diabetic. 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 We have been using Iodoflex to help prepare the surfaces of both wounds for an advanced treatment product. He is nonambulatory spending most of his time in a wheelchair I have asked him not to propel the wheelchair with his heels 1/13; in general his wounds look better not much surface area change we have  been using Iodoflex as of last week. I did an x-ray of the right heel as the patient was complaining of pain. I had some thoughts about a stress fracture perhaps Achilles tendon problems however what it showed was erosive changes along the inferior aspect of the calcaneus he now has a MRI booked for 1/20. 1/20; in general his wounds continue to be better. Some improvement in the large narrow areas proximally in his foot. He is still complaining of  pain in the right heel and tenderness in certain areas of this wound. His MRI is tonight. I am not just looking for osteomyelitis that was brought up on the x-ray I am wondering about stress fractures, tendon ruptures etc. He has no such findings on the left. Also noteworthy is that the patient had critical illness neuropathy and some of the discomfort may be actual improvement in nerve function I am just not sure. These wounds were initially in the setting of severe critical illness related to COVID-19. He was put in a standing position. He may have also been on pressors at the point contributing to tissue ischemia. By his description at some point these wounds were grossly necrotic extending proximally up into the Achilles part of his heel. I do not know that I have ever really seen pictures of them like this although they may exist in epic We have ordered Tri layer Oasis. I am trying to stimulate some granulation in these areas. This is of course assuming the MRI is negative for infection 1/27; since the patient was last here he saw Dr. Earlene Plater of infectious disease. He is planned for vancomycin and ceftriaxone. Prior operative culture grew MSSA. Also ordered baseline lab work. He also ordered arterial studies although the ABIs in our clinic were normal as well as his clinical exam these were normal I do not think he needs to see vascular surgery. His ABIs at the [facility] were 1.22 in the right triphasic waveforms with a normal TBI of 1.15 on the left ABI of  1.22 with triphasic waveforms and a normal TBI of 1.08. Finally he saw Dr. Logan Bores who will follow him in for 2 months. At this point I do not think he felt that he needed a procedure on the right calcaneal bone. Dr. Earlene Plater is elected for broad-spectrum antibiotic The patient is still having pain in the right heel. He walks with a walker 2/3; wounds are generally smaller. He is tolerating his IV antibiotics. I believe this is vancomycin and ceftriaxone. We are still waiting for Oasis burn in terms of his out-of-pocket max which he should be meeting soon given the IV antibiotics, MRIs etc. I have asked him to check in on this. We are using silver collagen in the meantime the wounds look better 2/10; tolerating IV vancomycin and Rocephin. We are waiting to apply for Oasis. Although I am not really sure where he is in his out-of-pocket max. 2/17 started the first application of Oasis trilayer. Still on antibiotics. The wounds have generally look better. The area on the left has a little more surface slough requiring debridement 2/24; second application of Oasis trilayer. The wound surface granulation is generally look better. The area on the left with undermining laterally I think is come in a bit. 10/08/2020 upon evaluation today patient is here today for [employer] application #3. Fortunately he seems to be doing extremely well with regard to this and we are seeing a lot of new epithelial growth which is great news. Fortunately there is no signs of active infection at this time. 10/16/2020 upon evaluation today patient appears to be doing well with regard to his foot ulcers. Do believe the Oasis has been of benefit for him. I do not see any signs of infection right now which is great news and I think that he has a lot of new epithelial growth which is great to see as well. The patient is very pleased to hear all of this. I do  think we can proceed with the Oasis trilayer #4 today. 3/18; not as much  improvement in these areas on his heels that I was hoping. I did reapply trilateral Oasis today the tissue looks healthier but not as much fill in as I was hoping. 3/25; better looking today I think this is come in a bit the tissue looks healthier. Triple layer Oasis reapplied #6 4/1; somewhat better looking definitely better looking surface not as much change in surface area as I was hoping. He may be spending more time [place] on days then he needs to although he does have heel offloading boots. Triple layer Oasis reapplied #7 4/7; unfortunately apparently [facility] will not approve any further Oasis which is unfortunate since the patient did respond nicely both in terms of the condition of the wound bed as well as surface area. There is still some drainage coming from the wound but not a lot there does not appear to be any infection 4/15; we have been using Hydrofera Blue. He continues to have nice rims of epithelialization on the right greater than the left. The left the epithelialization is coming from the tip of his heel. There is moderate drainage. In this that concerns me about a total contact cast. There is no evidence of infection 4/29; patient has been using Hydrofera Blue with dressing changes. He has no complaints or issues today. 5/5; using Hydrofera Blue. I actually think that he looks marginally better than the last time I saw this 3 weeks ago. There are rims of epithelialization on the left thumb coming from the medial side on the right. Using Hydrofera Blue 5/12; using Hydrofera Blue. These continue to make improvements in surface area. His drainage was not listed as severe I therefore went ahead and put a cast on the left foot. Right foot we will continue to dress his previous 5/16; back for first total contact cast change. He did not tolerate this particularly well cast injury on the anterior tibia among other issues. Difficulty sleeping. I talked him about this in some  detail and afterwards is elected to continue. I told him I would like to have a cast on for 3 weeks to see if this is going to help at all. I think he agreed 5/19; I think the wound is better. There is no tunneling towards his midfoot. The undermining medially also looks better. He has a rim of new skin distally. I think we are making progress here. The area on the left also continues to look somewhat better to me using Hydrofera Blue. He has a list of complaints about the JOHNNATHAN, DOWNS ([id number]) [id number] Page 4 of 19 cast but none of them seem serious 5/26; patient presents for 1 week follow-up. He has been using a total contact cast and tolerating this well. Hydrofera Blue is the main dressing used. He denies signs of infection. 6/2 Hydrofera Blue total contact cast on the left. These were large ulcers that formed in intensive care unit where the patient was recovering from COVID. May have had something to do with being ventilated in an upright positiono Pressors etc. We have been able to get the areas down considerably and a viable surface. There is some epithelialization in both sides. Note made of drainage 6/9; changed to [facility] last time because of drainage. He arrives with better looking surfaces and dimensions on the left than the right. Paradoxically the right actually probes more towards his midfoot the left is largely  close down but both of these look improved. Using a total contact cast on the left 6/16; complex wounds on his bilateral plantar heels which were initially pressure injury from a stay in the ICU with COVID. We have been using silver alginate most recently. His dimensions of come in quite dramatically however not recently. We have been putting the left foot in a total contact cast 6/23; complex wounds on the bilateral plantar heels. I been putting the left in the cast paradoxically the area on the right is the one that is going towards  closure at a faster rate. Quite a bit of drainage on the left. The patient went to see Dr. Logan Bores who said he was going to standby for skin grafts. I had actually considered sending him for skin grafts however he would be mandatorily off his feet for a period of weeks to months. I am thinking that the area on the right is going to close on its own the area on the left has been more stubborn even though we have him in a total contact cast 6/30; took him out of a total contact cast last week is the right heel seem to be making better progress than the left where I was placing the cast. We are using silver alginate. Both wounds are smaller right greater than left 7/12; both wounds look as though they are making some progress. We are using silver alginate. Heel offloading boots 7/26; very gradual progress especially on the right. Using silver alginate. He is wearing heel offloading boots 8/18; he continues to close these wounds down very gradually. Using silver alginate. The problem polymen being definitive about this is areas of what appears to be callus around the margins. This is not a 100% of the area but certainly sizable especially on the right 9/1; bilateral plantar feet wounds secondary to prolonged pressure while being ventilated for COVID-19 in an upright position. Essentially pressure ulcers on the bottom of his feet. He is made substantial progress using silver alginate. 9/14; bilateral plantar feet wounds secondary to prolonged pressure. Making progress using silver alginate. 9/29 bilateral plantar feet wounds secondary to prolonged pressure. I changed him to Iodoflex last week. MolecuLight showing reddened blush fluorescence 10/11; patient presents for follow-up. He has no issues or complaints today. He denies signs of infection. He continues to use Iodoflex and antibiotic ointment to the wound beds. 10/27; 2-week follow-up. No evidence of infection. He has callus and thick dry skin around  the wound margins we have been using Iodoflex and Bactroban which was in response to a moderate left MolecuLight reddish blush fluorescence. 11/10; 2-week follow-up. Wound margins again have thick callus however the measurements of the actual wound sites are a lot smaller. Everything looks reasonably healthy here. We have been using Iodoflex He was approved for prime matrix but I have elected to delay this given the improvement in the surface area. Hopefully I will not regret that decision as were getting close to the end of the year in terms of insurance payment 12/8; 2-week follow-up. Wounds are generally smaller in size. These were initially substantial wounds extending into the forefoot all the way into the heel on the bilateral plantar feet. They are now both located on the plantar heel distal aspect both of these have a lot of callus around the wounds I used a #5 curette to remove this on the right and the left also some subcutaneous debris to try and get the wound edges were using Iodoflex.  He has heel offloading shoe 12/22; 2-week follow-up. Not really much improvement. He has thick callus around the outer edges of both wounds. I remove this there is some nonviable subcutaneous tissue as well. We have been using Iodoflex. Her intake nurse and myself spontaneously thought of a total contact cast I went back in May. At that time we really were not seeing much of an improvement with a cast although the wound was in a much different situation I would like to retry this in 2 weeks and I discussed this with the patient 08/12/2021; the patient has had some improvement with the Iodoflex. The the area on the left heel plantar more improved than the right. I had to put him in a total contact cast on the left although I decided to put that off for 2 weeks. I am going to change his primary dressing to silver collagen. I think in both areas he has had some improvement most of the healing seems to be more  proximal in the heel. The wounds are in the mid aspect. A lot of thick callus on the right heel however. 1/19; we are using silver collagen on both plantar heel areas. He has had some improvement today. The left did not require any debridement. He still had some eschar on the right that was debrided but both seem to have contracted. I did not put it total contact cast on him today 2/2 we have been using silver collagen. The area on the right plantar heel has areas that appear to be epithelialized interspersed with dry flaking callus and dry skin. I removed this. This really looks better than on the other side. On the left still a large area with raised edges and debris on the surface. 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 He has no issues or complaints today. 2/9; not much change in the left foot wound with 1 week of a cast we are using silver collagen. Silver collagen on the right side. The right side has been the better wound surface. We will reapply the total contact cast on the left 2/16; not much improvement on either side I been using silver collagen with a total contact cast on the left. I'm changing the Hydrofera Blue still with a total contact cast on the left 2/23; some improvement on both sides. Disappointing that he has thick callus around the area that we are putting in a total contact cast on the left. We've been using Hydrofera Blue on both wound areas. This is a man who at essentially pressure ulcers in addition to ischemia caused by medications to support his blood pressure (pressors) in the ICU. He was being ventilated in the standing position for severe Covid. A Shiley the wounds extended across his entire foot but are now localized to his plantar heels bilaterally. We have made progress however neither areas healed. I continue to think the total  contact cast is helped albeit painstakingly slowly. He has never wanted a plastic surgery consult although I don't know that they would be interested in grafting in area in this location. 10/07/2021: Continued improvement bilaterally. There is still some callus around the left wound, despite the total contact cast. He has some increased pain in his right midfoot around 1 particular area. This has been painful in the past but seems to be a little bit worse.  When his cast was removed today, there was an area on the heel of the left foot that looks a bit macerated. He is also complaining of pain in his left thigh and hip which he thinks is secondary to the limb length discrepancy caused by the cast. 10/14/2021: He continues to improve. A little bit less callus around the left wound. He continues to endorse pain in his right midfoot, but this is not as significant as it was last week. The maceration on his left heel is improved. 10/21/2021: Continued improvement to both wounds. The maceration on his left heel is no longer evident. Less callus bilaterally. Epithelialization progressing. 10/28/2021: Significant improvement this week. The right sided wound is nearly closed with just a small open area at the middle. No maceration seen on the left heel. Continued epithelialization on both sides. No concern for infection. 11/04/2021: T oday, the wounds were measured a little bit differently and come out as larger, but I actually think they are about the same to potentially even smaller, particularly on the left. He continues to accumulate some callus on the right. 11/11/2021: T oday, the patient is expressing some concern that the left wound, despite being in the total contact cast, is not progressing at the same rate as the 1 on the right. He is interested in trying a week without the cast to see how the wound does. The wounds are roughly the same size as last week, with the right perhaps being a little bit smaller. He  continues to build up callus on both sites. 11/18/2021: Last week, I permitted the patient to go without his total contact cast, just to see if the cast was really making any difference. Today, both wounds have deteriorated to some extent, suggesting that the cast is providing benefit, at least on the left. Both are larger and have accumulated callus, slough, and ZYMIRE, MIKES ([id number]) [id number] Page 5 of 19 other debris. 11/26/2021: I debrided both wounds quite aggressively last week in an effort to stimulate the healing cascade. This appears to have been effective as the left sided wound is a full centimeter shorter in length. Although the right was measured slightly larger, on inspection, it looks as though an area of epithelialized tissue was included in the measurements. We have been using PolyMem Ag on the wound surfaces with a total contact cast to the left. 12/02/2021: It appears that the intake personnel are including epithelialized tissue in his wound measurements; the right wound is almost completely epithelialized; there is just a crater at the proximal midfoot with some open areas. On the left, he has built up some callus, but the overall wound surface looks good. There is some senescent skin around the wound margin. He has been in Poly[employer] bilaterally with a total contact cast on the left. 12/09/2021: The right wound is nearly closed; there is just a small open area at the mid calcaneus. On the left, the wound is smaller with minimal callus buildup. No significant drainage. 12/16/2021: The right calcaneal wound remains minimally open at the mid calcaneus; the rest has epithelialized. On the left, the wound is also a little bit smaller. There is some senescent tissue on the periphery. He is getting his first application of a trial skin substitute called Vendaje today. 12/23/2021: The wound on his right calcaneus is nearly closed; there is just a small area at  the most distal aspect of the calcaneus that is open. On the left, the area where we  applied to the skin substitute has a healthier-looking bed of granulation tissue. The wound dimensions are not significantly different on this side but the wound surface is improved. 12/30/2021: The wound on the right calcaneus has not changed significantly aside from some accumulation of callus. On the left, the open area is smaller and continues to have an improved surface. He continues to accumulate callus around the wound. He is here for his third application of Vendaje. 01/06/2022: The right calcaneal wound is down to just a couple of millimeters. He continues to accumulate periwound callus. He unfortunately got his cast wet earlier in the week and his left foot is macerated, resulting in some superficial skin loss just distal to the open wound. The open wound itself, however, is much smaller and has a healthier appearing surface. He is here for his fourth application of Vendaje. 01/13/2022: The right calcaneal wound is about the same. Unfortunately, once again, his cast got wet and his foot is again macerated. This is caused the left calcaneal wound to enlarge. He is here for his fifth application of Vendaje. 01/20/2022: The right calcaneal wound is very small. There is some periwound callus accumulation. He purchased a new cast protector last week and this has been effective in avoiding the maceration that has been occurring on the left. The left calcaneal wound is narrower and has a healthy and viable-appearing surface. He is here for his 6 application of Vendaje. 01/27/2022: The right calcaneal wound is down to just a pinhole. There is some periwound slough and callus. On the left, the wound is narrower and shorter by about a centimeter. The surface is robust and viable-appearing. Unfortunately, the rep for the trial skin substitute product did not provide any for [place] to use today. 02/04/2022: The right calcaneal  wound remains unchanged. There is more accumulated callus. On the left, although the intake nurse measured it a little bit longer, it looks about the same to me. The surface has a layer of slough, but underneath this, there is good granulation tissue. 02/10/2022: The right calcaneus wound is nearly closed. There is still some callus that builds up around the site. The left side looks about the same in terms of dimensions, but the surface is more robust and vital-appearing. 02/16/2022: The area of the right calcaneus that was nearly closed last week has closed, but there is a small opening at the mid foot where it looks like some moisture got retained and caused some reopening. The left foot wound is narrower and shallower. Both sites have a fair amount of periwound callus and eschar. 02/24/2022: The small midfoot opening on the right calcaneus is a little bit smaller today. The left foot wound is narrower and shallower. He continues to accumulate periwound callus. No concern for infection. 03/01/2022: The patient came to clinic early because he showered and got his cast wet. Fortunately, there is no significant maceration to his foot but the callus softened and it looks like the wound on his left calcaneus may be a little bit wider. The wound on his right calcaneus is just a narrow slit. Continued accumulation of periwound callus bilaterally. 03/08/2022: The wound on his right calcaneus is very nearly closed, just a small pinpoint opening under a bit of eschar; the left wound has come in quite a bit, as well. It is narrower and shorter than at our last visit. Still with accumulated callus and eschar bilaterally. 03/17/2022: The right calcaneal wound is healed. The left wound is smaller and  the surface itself is very clean, but there is some blue-green staining on the periwound callus, concerning for Pseudomonas aeruginosa. 03/23/2022: The right calcaneal wound remains closed. The left wound continues to  contract. No further blue-green staining. Small amount of callus and slough accumulation. 03/28/2022: He came in early today because he had gotten his cast wet. On inspection, the wound itself did not get wet or macerated, just a little bit of the forefoot. The wound itself is basically unchanged. 04/07/2022: The right foot wound remains closed. The left wound is the smallest that I have seen it to date. It is narrower and shorter. It still continues to accumulate slough on the surface. 04/15/2022: There is a band of epithelium now dividing the small left plantar foot wound in 2. There is still some slough on the surface. 04/21/2022: The wound continues to narrow. Just a little bit of slough on the surface. He seems to be responding well to endoform. 04/28/2022: Continued slow contraction of the wound. There is a little slough on the surface and some periwound callus. We have been using endoform and total contact cast. 05/05/2022: The wound appears to have stalled. There is slough and some periwound eschar/callus. No concern for infection, however. 05/12/2022: Unfortunately, his right foot has reopened. It is located at the most posterior aspect of his surgical incision. The area was noted to have drainage coming from it when his padding was removed today. Underneath some callus and senescent skin, there is an opening. No purulent drainage or malodor. On the left foot, the wound is again unchanged. There is some light blue staining on the callus, but no malodor or purulent drainage. 10/13; right and left heel remanence of extensive plantar foot wounds. These are better than I remember by quite a big margin however he is still left with wounds on the left plantar heel and the right plantar heel. Been using endoform bilaterally. A culture was done that showed apparently Pseudomonas but we are still waiting for the [facility] antibiotic to use gentamicin today. He is still very active by description I am not sure  about the offloading of his noncasted right foot 10/20; both wounds right and left heel debrided not much change from last week. [name] Echevaria has arrived which is linezolid, gentamicin and ciprofloxacin we will use this with endoform. T contact cast on the left otal 06/02/2022: Both wounds are smaller today. There is still a fair amount of callus buildup around the right foot ulcer. The left is more superficial and nearly flush with the surrounding tissues. Also with slough and eschar buildup. CATRELL, THORNBERRY ([id number]) [id number] Page 6 of 19 06/10/2022: The right sided wound appears to be nearly closed, if not completely so, although it is somewhat difficult to tell given the abnormal tissue and scarring in his foot. There is a fair amount of callus and crust accumulation. On the left, the wound looks about the same, again with callus and slough. He has an appointment next Thursday with Dr. Annamary Rummage in podiatry; I am hopeful that there may be some reconstructive options available for Mr. Kroth. 06/16/2022: Both wounds have some eschar and callus accumulation. The right sided wound is extremely narrow and barely open; the left is narrower than last week. There is a little bit of slough. He has his appointment in podiatry later today. 06/23/2022: The patient met with Dr. Annamary Rummage last week and unfortunately, there are no reconstructive options that he believes would be helpful. He did order an MRI to  evaluate for osteomyelitis and fortunately, none was seen. The left sided wound is a little bit shorter and narrower today. The right sided wound is about the same. There is callus and eschar accumulation bilaterally. 06/29/2022: Both feet have improved from last week. There is epithelium making a valiant effort to creep across the surface on the left. The right side looks like it got a little dry and the deep crevasse in his midfoot has cracked. Both have eschar and there  is some slough on the left. 07/07/2022: Both feet have improved. There is epithelium completely covering the calcaneus on the right with just a small opening in the crevasse in his midfoot. On the left, the open area of tissue is smaller but he continues to build up callus/eschar and slough. 07/15/2022: The opening in the midfoot on the right is about the same size, covered with eschar and a little bit of slough. The open portion of the left wound is narrower and shorter with a bit of slough buildup. He admits to being on his feet more than recommended. 12/14; as far as I can tell everything on the right foot is closed. There is some eschar I removed some of this I cannot identify any open wound here. As usual this will be a very vulnerable area going forward. On the left this looks really quite healthy. I was pleasantly surprised to see how good this looked. Wound is certainly smaller and there appears to be healthy epithelialization. He has been using Promogran on the right and endoform on the left. He has been offloading the right foot with a heel offloading boot and he has a running shoe on the right foot 08/04/2022: The right foot remains closed. He has a thick cushioned insole in his sneaker. The left sided wound is smaller with just some slough and eschar accumulation. He is wearing the heel off loader on this foot. 08/15/2022: The right foot remains closed. The left wound has narrowed further. There is some slough and eschar accumulation. 08/25/2022: We put him in a peg assist shoe insert and as a result, he has more epithelialization of the ulcer with minimal slough and eschar accumulation. 09/01/2022: The wound is smaller by about half this week. Still with some slough on the surface. The peg assist shoe seems to be doing a remarkable job of adequately offloading the site. 09/08/2022: There is a little bit more epithelium coming in. There is some slough and callus buildup. 09/16/2022: The wound  measures about the same size, but the epithelium that has grown in looks more robust and stronger. There is some slough and callus buildup. 09/23/2022: The wound remains about the same size. The skin edges are looking rather senescent. 09/29/2022: The aggressive debridement I performed last week seems to have been effective. The wound is smaller and has significantly less slough accumulation. 10/06/2022: There has been quite a bit of epithelialization since last week. There are still some open areas with slough accumulation. There is some callus buildup around the perimeter. 10/13/2022: Continued improvement. Minimal callus accumulation. 3/14; patient presents for follow-up. He has been using endoform to the wound bed without issues. He is using a surgical shoe with peg assist for offloading. 10/27/2022: The wound dimensions are stable. There is some senescent skin accumulation around the perimeter. 11/04/2022: Last week I performed a very aggressive debridement in an effort to stimulate the healing cascade. As has been the case when I have done this before, the wound has responded well. There  has been epithelialization and contraction of the wound. There are just a couple of small open areas. 11/10/2022: Unfortunately, his foot got wet secondary to sweating and there has been some breakdown of the tissue, particularly the skin just adjacent to the main ulcer. 11/18/2022: We continue to struggle with moisture-related tissue breakdown around the perimeter of his wound. This has caused the thin epithelium that had formed on the surface to disintegrate. The underlying surface of the wound has good granulation tissue. 11/25/2022: The edges of the wound are much less macerated, but the surface is actually looking a little bit dry. There is slough accumulation. No obvious signs of infection. 12/01/2022: The wound edges are more macerated and broken down, making the wound larger. The surface has some slough on  it. 12/08/2020: The wound looks better this week. There is significantly less maceration and moisture-related tissue breakdown. There is some slough on the wound surface. 12/15/2022: The wound continues to improve. There is almost no moisture-related tissue breakdown. There is epithelium beginning to fill in again from the edges. Light slough on the wound surface. 12/22/2022: More epithelium has filled him from around the edges. There is some slough on the surface. No evidence of moisture-related tissue breakdown. 01/05/2023: There has been more epithelialization, particularly at the posterior calcaneal aspect of the wound. There has been a little bit of moisture accumulation with minimal maceration. 01/12/2023: More epithelium has filled in. There is very minimal accumulation of slough. 01/20/2023: The wound is measuring a little bit smaller today. It did measure deeper, but this appears to be secondary to some heaped up senescent skin around the edges. The epithelium that has been present at the posterior aspect of the wound seems to be a bit more robust with greater integrity. 01/26/2023: He reports intense itching to the skin around the wound and there has been some breakdown here. It looks fungal in nature. The wound itself looks pretty good with improved epithelialization with just some slough buildup. He has a little callus along the wound edges. 02/02/2023: He has been treating the periwound skin with an over-the-counter athlete's foot medication and notes significant improvement in his pruritus. The skin looks better here, as well. The wound continues to epithelialize. There is light slough accumulation. TERREANCE, VALCOURT ([id number]) [id number] Page 7 of 19 02/10/2023: The periwound skin still looks fairly angry. He reports multiple small blisters that have opened up. He is not having the intense itching that he had prior, however. There has been more epithelialization at the  calcaneus and there is minimal slough accumulation. 02/16/2023: The periwound skin is improving. An online review of dermatology rash images suggest that this may be hand-foot-and-mouth disease, isolated to the feet; the patient says he actually had this as a child. The calcaneus continues to epithelialize and the tissue was quite robust here. Very minimal slough accumulation on the open portion of the wound with a little bit of periwound callus buildup. 03/03/2023: The wound measurements are about the same, but there is more epithelium making its way to the surface. There is a little bit of crust and eschar around the edges and minimal slough on the surface. 03/09/2023: His dressing was applied rather haphazardly and the drawtex was not applied. The Optifoam was also reversed such that the orange side was in contact with his bare skin. He has some maceration around the wound edges, but this did not appear to result in any tissue breakdown. There is a little bit of slough on the  surface and minimal eschar around the edges. 03/15/2023: The wound measured narrower today. There is some slough accumulation on the surface and some eschar around the edges. 03/23/2023: The length of the wound remains stable, but it continues to narrow. The skin that has covered the posterior aspect of the calcaneus is more robust and has better tensile integrity. Minimal slough and eschar accumulation. 03/30/2023: The wound length has decreased. This seems to be primarily due to more epithelium covering the posterior aspect of the site. Minimal slough and eschar buildup. 04/07/2023: The wound measured a little bit smaller again today. There is minimal slough accumulation with just a little bit of eschar and callus around the edges. 04/13/2023: The lateral aspect of his wound is starting to epithelialize. There is very minimal slough on the surface. Moisture control is good. 04/20/2023: Continued, albeit extremely slow, encroachment of  epithelium as of the wound surface. Minimal slough. Moisture balance is good. 04/27/2023: More epithelium is filling in from the heel area. There is a little bit of slough on the surface with some periwound callus and eschar. He did note a little bit of drainage coming from his right foot and there is a small crack in the skin extending medially from the main site of his prior ulceration. 05/04/2023: The wounds are stable this week. No significant change. There is a little bit of callus buildup around the edges of each site and some thin slough on the surface on the left foot. 05/11/2023: The skin on the right heel is filling in further. There is a little bit of slough accumulation on both surfaces along with periwound callus accumulation. 05/18/2023: Both wounds are smaller today. The right foot is down to just a sliver of opening. 05/25/2023: Epithelium continues to advance from the calcaneal area towards the midfoot on the left. On the right, there is tiniest crack of an opening that remains. 06/01/2023: No significant change bilaterally. He has built up quite a bit of callus on the calcaneal surface of the right foot. 06/15/2023: He missed his appointment last week. The wounds measured larger today. There is some slough and eschar accumulation on both wounds. He was discussed in the [facility] wound camp last week. The consensus was in favor of proceeding with plastic surgery intervention but they also suggested repeating the MRI imaging of his heels to ensure no osteomyelitis present. 06/22/2023: The wounds are basically stable. His MRI is scheduled for tomorrow and his surgery for a week from tomorrow, 22 November. Electronic Signature(s) Signed: 06/22/2023 11:08:29 AM By: [name] MD FACS Entered By: [name] on 06/22/2023 82:95:62 -------------------------------------------------------------------------------- Physical Exam Details Patient Name: Date of Service: [name], TO [place]  E. 06/22/2023 10:45 A M Medical Record Number: [id number] Patient Account Number: [id number] Date of Birth/Sex: Treating RN: 1976/01/31 (47 y.o. M) Primary Care Provider: Dorinda Hill Other Clinician: Referring Provider: Treating Provider/Extender: Jana Hakim in Treatment: 153 Constitutional . . . . no acute distress. Respiratory Normal work of breathing on room air.. Notes 06/22/2023: The wounds are basically stable. Electronic Signature(s) Signed: 06/22/2023 11:09:08 AM By: [name] MD FACS Entered By: [name] on 06/22/2023 08:09:08 [name] ([id number]) [id number] Page 8 of 19 -------------------------------------------------------------------------------- Physician Orders Details Patient Name: Date of Service: [name] 06/22/2023 10:45 A M Medical Record Number: [id number] Patient Account Number: [id number] Date of Birth/Sex: Treating RN: 01/29/1976 (47 y.o. [name] Primary Care Provider: Dorinda Hill Other Clinician: Referring Provider: Treating Provider/Extender: Lady Gary  Daleen Squibb, Tobias Alexander in Treatment: 153 The following information was scribed by: Zenaida Deed The information was scribed for: [name] Verbal / Phone Orders: No Diagnosis Coding ICD-10 Coding Code Description L97.512 Non-pressure chronic ulcer of other part of right foot with fat layer exposed L97.522 Non-pressure chronic ulcer of other part of left foot with fat layer exposed Follow-up Appointments ppointment in: - call to schedule appointment if needed after plastic surgery Return A Anesthetic Wound #2 Left Calcaneus (In clinic) Topical Lidocaine 4% applied to wound bed - In clinic Bathing/ Shower/ Hygiene May shower and wash wound with soap and water. Off-Loading Other: - keep pressure off of the bottom of your feet. Elevate legs throughout the day. Use the Shoe with the PegAssist  off-loading insole Additional Orders / Instructions Follow Nutritious Diet - Try to get 70-100 grams of Protein a day+ Coupons for Juven sent with patient. Other: - 500 mg per day to start, then increase to 500mg  twice a day. Also consider adding a zinc supplement. Juven Shake 1-2 times daily. Wound Treatment Wound #2 - Calcaneus Wound Laterality: Left Cleanser: Normal Saline (Generic) 1 x Per Day/Other:until healed Discharge Instructions: Cleanse the wound with Normal Saline prior to applying a clean dressing using gauze sponges, not tissue or cotton balls. Cleanser: Wound Cleanser (Generic) 1 x Per Day/Other:until healed Discharge Instructions: Cleanse the wound with wound cleanser prior to applying a clean dressing using gauze sponges, not tissue or cotton balls. Peri-Wound Care: Ketoconazole Cream 2% (Generic) 1 x Per Day/Other:until healed Discharge Instructions: Apply Ketoconazole mixed with zinc to the periwound Peri-Wound Care: Zinc Oxide Ointment 30g tube (Generic) 1 x Per Day/Other:until healed Discharge Instructions: Apply Zinc Oxide to periwound with each dressing change Topical: Gentamicin (Generic) 1 x Per Day/Other:until healed Discharge Instructions: apply to the wound bed Topical: Ketoconazole Cream 2% 1 x Per Day/Other:until healed Discharge Instructions: Apply Ketoconazole to periwound Prim Dressing: Maxorb Extra Ag+ Alginate Dressing, 4x4.75 (in/in) (Generic) 1 x Per Day/Other:until healed ary Discharge Instructions: Apply to wound bed as instructed Secondary Dressing: Drawtex 4x4 in (Generic) 1 x Per Day/Other:until healed Discharge Instructions: Apply over primary dressing as directed. Secondary Dressing: Optifoam Non-Adhesive Dressing, 4x4 in (Generic) 1 x Per Day/Other:until healed Discharge Instructions: Apply over primary dressing as directed. FAWKES, PIVARNIK ([id number]) [id number] Page 9 of 19 Secondary Dressing: Woven Gauze Sponge,  Non-Sterile 4x4 in (Generic) 1 x Per Day/Other:until healed Discharge Instructions: Apply over primary dressing as directed. Secured With: 65M Medipore H Soft Cloth Surgical T ape, 4 x 10 (in/yd) (Generic) 1 x Per Day/Other:until healed Discharge Instructions: Secure with tape as directed. Compression Wrap: Kerlix Roll 4.5x3.1 (in/yd) (Generic) 1 x Per Day/Other:until healed Discharge Instructions: Apply Kerlix and Coban compression as directed. Add-Ons: [employer] Offloading Boot, Size Regular 1 x Per Day/Other:until healed Wound #5 - Foot Wound Laterality: Plantar, Right Peri-Wound Care: Ketoconazole Cream 2% 1 x Per Day/30 Days Discharge Instructions: Apply Ketoconazole to periwound as needed Peri-Wound Care: Zinc Oxide Ointment 30g tube 1 x Per Day/30 Days Discharge Instructions: Apply Zinc Oxide to periwound with each dressing change Topical: Gentamicin 1 x Per Day/30 Days Discharge Instructions: As directed by physician Prim Dressing: Maxorb Extra Ag+ Alginate Dressing, 4x4.75 (in/in) 1 x Per Day/30 Days ary Discharge Instructions: Apply to wound bed as instructed Secondary Dressing: Drawtex 4x4 in 1 x Per Day/30 Days Discharge Instructions: Apply over primary dressing as directed. Secondary Dressing: Optifoam Non-Adhesive Dressing, 4x4 in 1 x Per Day/30 Days Discharge Instructions:  Apply over primary dressing as directed. Secured With: [employer], 4.5x3.1 (in/yd) 1 x Per Day/30 Days Discharge Instructions: Secure with Kerlix as directed. Electronic Signature(s) Signed: 06/22/2023 3:32:46 PM By: [name] MD FACS Entered By: [name] on 06/22/2023 08:09:31 -------------------------------------------------------------------------------- Problem List Details Patient Name: Date of Service: [name], [name] 06/22/2023 10:45 A M Medical Record Number: [id number] Patient Account Number: [id number] Date of Birth/Sex: Treating RN: [birth year] (47 y.o. [name] Primary Care Provider: Dorinda Hill Other Clinician: Referring Provider: Treating Provider/Extender: Jana Hakim in Treatment: [phone] Active Problems ICD-10 Encounter Code Description Active Date MDM Diagnosis L97.512 Non-pressure chronic ulcer of other part of right foot with fat layer exposed 04/27/2023 No Yes L97.522 Non-pressure chronic ulcer of other part of left foot with fat layer exposed 09/03/2020 No Yes Inactive Problems ICD-10 ABDULHAMID, UPPAL ([id number]) [id number] Page 10 of 26 Code Description Active Date Inactive Date L89.893 Pressure ulcer of other site, stage 3 07/15/2020 07/15/2020 M62.81 Muscle weakness (generalized) 07/15/2020 07/15/2020 I10 Essential (primary) hypertension 07/15/2020 07/15/2020 M86.171 Other acute osteomyelitis, right ankle and foot 09/03/2020 09/03/2020 Resolved Problems Electronic Signature(s) Signed: 06/22/2023 11:02:57 AM By: [name] MD FACS Entered By: [name] on 06/22/2023 08:02:57 -------------------------------------------------------------------------------- Progress Note Details Patient Name: Date of Service: [name], [name] 06/22/2023 10:45 A M Medical Record Number: [id number] Patient Account Number: [id number] Date of Birth/Sex: Treating RN: Mar 10, 1976 (47 y.o. M) Primary Care Provider: Dorinda Hill Other Clinician: Referring Provider: Treating Provider/Extender: Jana Hakim in Treatment: 153 Subjective Chief Complaint Information obtained from Patient Bilateral Plantar Foot Ulcers History of Present Illness (HPI) Wounds are12/03/2020 upon evaluation today patient presents for initial inspection here in our clinic concerning issues he has been having with the bottoms of his feet bilaterally. He states these actually occurred as wounds when he was hospitalized for 5 months secondary to Covid. He was apparently with tilting bed where  he was in an upright position quite frequently and apparently this occurred in some way shape or form during that time. Fortunately there is no sign of active infection at this time. No fevers, chills, nausea, vomiting, or diarrhea. With that being said he still has substantial wounds on the plantar aspects of his feet Theragen require quite a bit of work to get these to heal. He has been using Santyl currently though that is been problematic both in receiving the medication as well as actually paid for it as it is become quite expensive. Prior to the experience with Covid the patient really did not have any major medical problems other than hypertension he does have some mild generalized weakness following the Covid experience. 07/22/2020 on evaluation today patient appears to be doing okay in regard to his foot ulcers I feel like the wound beds are showing signs of better improvement that I do believe the Iodoflex is helping in this regard. With that being said he does have a lot of drainage currently and this is somewhat blue/green in nature which is consistent with Pseudomonas. I do think a culture today would be appropriate for [place] to evaluate and see if that is indeed the case I would likely start him on antibiotic orally as well he is not allergic to Cipro knows of no issues he has had in the past 12/21; patient was admitted to the clinic earlier this month with bilateral presumed pressure ulcers on the bottom of his feet apparently related to excessive pressure from a  tilt table arrangement in the intensive care unit. Patient relates this to being on ECMO but I am not really sure that is exactly related to that. I must say I have never seen anything like this. He has fairly extensive full-thickness wounds extending from his heel towards his midfoot mostly centered laterally. There is already been some healing distally. He does not appear to have an arterial issue. He has been using gentamicin to  the wound surfaces with Iodoflex to help with ongoing debridement 1/6; this is a patient with pressure ulcers on the bottom of his feet related to excessive pressure from a standing position in the intensive care unit. He is complaining of a lot of pain in the right heel. He is not a diabetic. 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 We have been using Iodoflex to help prepare the surfaces of both wounds for an advanced treatment product. He is nonambulatory spending most of his time in a wheelchair I have asked him not to propel the wheelchair with his heels 1/13; in general his wounds look better not much surface area change we have been using Iodoflex as of last week. I did an x-ray of the right heel as the patient was complaining of pain. I had some thoughts about a stress fracture perhaps Achilles tendon problems however what it showed was erosive changes along the inferior aspect of the calcaneus he now has a MRI booked for 1/20. 1/20; in general his wounds continue to be better. Some improvement in the large narrow areas proximally in his foot. He is still complaining of pain in the right heel and tenderness in certain areas of this wound. His MRI is tonight. I am not just looking for osteomyelitis that was brought up on the x-ray I am wondering about stress fractures, tendon ruptures etc. He has no such findings on the left. Also noteworthy is that the patient had critical illness neuropathy and some of the discomfort may be actual improvement in nerve function I am just not sure. These wounds were initially in the setting of severe critical illness related to COVID-19. He was put in a standing position. He may have also been on pressors at the point contributing to tissue ischemia. By his description at some point these wounds were grossly necrotic extending proximally up into the Achilles part of his heel. I do not know that I have ever really seen pictures of them  like this although they may exist in epic We have ordered Tri layer Oasis. I am trying to stimulate some granulation in these areas. This is of course assuming the MRI is negative for infection TALON, DISHNER ([id number]) [id number] Page 11 of 19 1/27; since the patient was last here he saw Dr. Earlene Plater of infectious disease. He is planned for vancomycin and ceftriaxone. Prior operative culture grew MSSA. Also ordered baseline lab work. He also ordered arterial studies although the ABIs in our clinic were normal as well as his clinical exam these were normal I do not think he needs to see vascular surgery. His ABIs at the [facility] were 1.22 in the right triphasic waveforms with a normal TBI of 1.15 on the left ABI of 1.22 with triphasic waveforms and a normal TBI of 1.08. Finally he saw Dr. Logan Bores who will follow him in for 2 months. At this point I do not think he felt that he needed a procedure on the right calcaneal bone. Dr. Earlene Plater is elected for broad-spectrum antibiotic  The patient is still having pain in the right heel. He walks with a walker 2/3; wounds are generally smaller. He is tolerating his IV antibiotics. I believe this is vancomycin and ceftriaxone. We are still waiting for Oasis burn in terms of his out-of-pocket max which he should be meeting soon given the IV antibiotics, MRIs etc. I have asked him to check in on this. We are using silver collagen in the meantime the wounds look better 2/10; tolerating IV vancomycin and Rocephin. We are waiting to apply for Oasis. Although I am not really sure where he is in his out-of-pocket max. 2/17 started the first application of Oasis trilayer. Still on antibiotics. The wounds have generally look better. The area on the left has a little more surface slough requiring debridement 2/24; second application of Oasis trilayer. The wound surface granulation is generally look better. The area on the left with undermining  laterally I think is come in a bit. 10/08/2020 upon evaluation today patient is here today for [employer] application #3. Fortunately he seems to be doing extremely well with regard to this and we are seeing a lot of new epithelial growth which is great news. Fortunately there is no signs of active infection at this time. 10/16/2020 upon evaluation today patient appears to be doing well with regard to his foot ulcers. Do believe the Oasis has been of benefit for him. I do not see any signs of infection right now which is great news and I think that he has a lot of new epithelial growth which is great to see as well. The patient is very pleased to hear all of this. I do think we can proceed with the Oasis trilayer #4 today. 3/18; not as much improvement in these areas on his heels that I was hoping. I did reapply trilateral Oasis today the tissue looks healthier but not as much fill in as I was hoping. 3/25; better looking today I think this is come in a bit the tissue looks healthier. Triple layer Oasis reapplied #6 4/1; somewhat better looking definitely better looking surface not as much change in surface area as I was hoping. He may be spending more time [place] on days then he needs to although he does have heel offloading boots. Triple layer Oasis reapplied #7 4/7; unfortunately apparently [facility] will not approve any further Oasis which is unfortunate since the patient did respond nicely both in terms of the condition of the wound bed as well as surface area. There is still some drainage coming from the wound but not a lot there does not appear to be any infection 4/15; we have been using Hydrofera Blue. He continues to have nice rims of epithelialization on the right greater than the left. The left the epithelialization is coming from the tip of his heel. There is moderate drainage. In this that concerns me about a total contact cast. There is no evidence of infection 4/29;  patient has been using Hydrofera Blue with dressing changes. He has no complaints or issues today. 5/5; using Hydrofera Blue. I actually think that he looks marginally better than the last time I saw this 3 weeks ago. There are rims of epithelialization on the left thumb coming from the medial side on the right. Using Hydrofera Blue 5/12; using Hydrofera Blue. These continue to make improvements in surface area. His drainage was not listed as severe I therefore went ahead and put a cast on the left foot. Right  foot we will continue to dress his previous 5/16; back for first total contact cast change. He did not tolerate this particularly well cast injury on the anterior tibia among other issues. Difficulty sleeping. I talked him about this in some detail and afterwards is elected to continue. I told him I would like to have a cast on for 3 weeks to see if this is going to help at all. I think he agreed 5/19; I think the wound is better. There is no tunneling towards his midfoot. The undermining medially also looks better. He has a rim of new skin distally. I think we are making progress here. The area on the left also continues to look somewhat better to me using Hydrofera Blue. He has a list of complaints about the cast but none of them seem serious 5/26; patient presents for 1 week follow-up. He has been using a total contact cast and tolerating this well. Hydrofera Blue is the main dressing used. He denies signs of infection. 6/2 Hydrofera Blue total contact cast on the left. These were large ulcers that formed in intensive care unit where the patient was recovering from COVID. May have had something to do with being ventilated in an upright positiono Pressors etc. We have been able to get the areas down considerably and a viable surface. There is some epithelialization in both sides. Note made of drainage 6/9; changed to [facility] last time because of drainage. He arrives with better looking  surfaces and dimensions on the left than the right. Paradoxically the right actually probes more towards his midfoot the left is largely close down but both of these look improved. Using a total contact cast on the left 6/16; complex wounds on his bilateral plantar heels which were initially pressure injury from a stay in the ICU with COVID. We have been using silver alginate most recently. His dimensions of come in quite dramatically however not recently. We have been putting the left foot in a total contact cast 6/23; complex wounds on the bilateral plantar heels. I been putting the left in the cast paradoxically the area on the right is the one that is going towards closure at a faster rate. Quite a bit of drainage on the left. The patient went to see Dr. Logan Bores who said he was going to standby for skin grafts. I had actually considered sending him for skin grafts however he would be mandatorily off his feet for a period of weeks to months. I am thinking that the area on the right is going to close on its own the area on the left has been more stubborn even though we have him in a total contact cast 6/30; took him out of a total contact cast last week is the right heel seem to be making better progress than the left where I was placing the cast. We are using silver alginate. Both wounds are smaller right greater than left 7/12; both wounds look as though they are making some progress. We are using silver alginate. Heel offloading boots 7/26; very gradual progress especially on the right. Using silver alginate. He is wearing heel offloading boots 8/18; he continues to close these wounds down very gradually. Using silver alginate. The problem polymen being definitive about this is areas of what appears to be callus around the margins. This is not a 100% of the area but certainly sizable especially on the right 9/1; bilateral plantar feet wounds secondary to prolonged pressure while being ventilated  for COVID-19 in an upright position. Essentially pressure ulcers on the bottom of his feet. He is made substantial progress using silver alginate. 9/14; bilateral plantar feet wounds secondary to prolonged pressure. Making progress using silver alginate. 9/29 bilateral plantar feet wounds secondary to prolonged pressure. I changed him to Iodoflex last week. MolecuLight showing reddened blush fluorescence 10/11; patient presents for follow-up. He has no issues or complaints today. He denies signs of infection. He continues to use Iodoflex and antibiotic ointment to the wound beds. 10/27; 2-week follow-up. No evidence of infection. He has callus and thick dry skin around the wound margins we have been using Iodoflex and Bactroban which was in response to a moderate left MolecuLight reddish blush fluorescence. 11/10; 2-week follow-up. Wound margins again have thick callus however the measurements of the actual wound sites are a lot smaller. Everything looks reasonably healthy here. We have been using Iodoflex He was approved for prime matrix but I have elected to delay this given the improvement in the surface area. Hopefully I will not regret that decision as were getting close to the end of the year in terms of insurance payment 12/8; 2-week follow-up. Wounds are generally smaller in size. These were initially substantial wounds extending into the forefoot all the way into the heel on the bilateral plantar feet. They are now both located on the plantar heel distal aspect both of these have a lot of callus around the wounds I used a #5 curette to remove this on the right and the left also some subcutaneous debris to try and get the wound edges were using Iodoflex. He has heel offloading shoe 12/22; 2-week follow-up. Not really much improvement. He has thick callus around the outer edges of both wounds. I remove this there is some nonviable subcutaneous tissue as well. We have been using Iodoflex. Her  intake nurse and myself spontaneously thought of a total contact cast I went back in May. At that time we really were not seeing much of an improvement with a cast although the wound was in a much different situation I would like to retry this in 2 weeks and I discussed this with the patient ROC, TEEM ([id number]) [phone] Page 12 of 19 08/12/2021; the patient has had some improvement with the Iodoflex. The the area on the left heel plantar more improved than the right. I had to put him in a total contact cast on the left although I decided to put that off for 2 weeks. I am going to change his primary dressing to silver collagen. I think in both areas he has had some improvement most of the healing seems to be more proximal in the heel. The wounds are in the mid aspect. A lot of thick callus on the right heel however. 1/19; we are using silver collagen on both plantar heel areas. He has had some improvement today. The left did not require any debridement. He still had some eschar on the right that was debrided but both seem to have contracted. I did not put it total contact cast on him today 2/2 we have been using silver collagen. The area on the right plantar heel has areas that appear to be epithelialized interspersed with dry flaking callus and dry skin. I removed this. This really looks better than on the other side. On the left still a large area with raised edges and debris on the surface. The patient states he is in the heel offloading boots for a prolonged period  of time and really does not use any other footwear 2/6; patient presents for first cast exchange. He has no issues or complaints today. 2/9; not much change in the left foot wound with 1 week of a cast we are using silver collagen. Silver collagen on the right side. The right side has been the better wound surface. We will reapply the total contact cast on the left 2/16; not much improvement on either  side I been using silver collagen with a total contact cast on the left. I'm changing the Hydrofera Blue still with a total contact cast on the left 2/23; some improvement on both sides. Disappointing that he has thick callus around the area that we are putting in a total contact cast on the left. We've been using Hydrofera Blue on both wound areas. This is a man who at essentially pressure ulcers in addition to ischemia caused by medications to support his blood pressure (pressors) in the ICU. He was being ventilated in the standing position for severe Covid. A Shiley the wounds extended across his entire foot but are now localized to his plantar heels bilaterally. We have made progress however neither areas healed. I continue to think the total contact cast is helped albeit painstakingly slowly. He has never wanted a plastic surgery consult although I don't know that they would be interested in grafting in area in this location. 10/07/2021: Continued improvement bilaterally. There is still some callus around the left wound, despite the total contact cast. He has some increased pain in his right midfoot around 1 particular area. This has been painful in the past but seems to be a little bit worse. When his cast was removed today, there was an area on the heel of the left foot that looks a bit macerated. He is also complaining of pain in his left thigh and hip which he thinks is secondary to the limb length discrepancy caused by the cast. 10/14/2021: He continues to improve. A little bit less callus around the left wound. He continues to endorse pain in his right midfoot, but this is not as significant as it was last week. The maceration on his left heel is improved. 10/21/2021: Continued improvement to both wounds. The maceration on his left heel is no longer evident. Less callus bilaterally. Epithelialization progressing. 10/28/2021: Significant improvement this week. The right sided wound is nearly  closed with just a small open area at the middle. No maceration seen on the left heel. Continued epithelialization on both sides. No concern for infection. 11/04/2021: T oday, the wounds were measured a little bit differently and come out as larger, but I actually think they are about the same to potentially even smaller, particularly on the left. He continues to accumulate some callus on the right. 11/11/2021: T oday, the patient is expressing some concern that the left wound, despite being in the total contact cast, is not progressing at the same rate as the 1 on the right. He is interested in trying a week without the cast to see how the wound does. The wounds are roughly the same size as last week, with the right perhaps being a little bit smaller. He continues to build up callus on both sites. 11/18/2021: Last week, I permitted the patient to go without his total contact cast, just to see if the cast was really making any difference. Today, both wounds have deteriorated to some extent, suggesting that the cast is providing benefit, at least on the left. Both are  larger and have accumulated callus, slough, and other debris. 11/26/2021: I debrided both wounds quite aggressively last week in an effort to stimulate the healing cascade. This appears to have been effective as the left sided wound is a full centimeter shorter in length. Although the right was measured slightly larger, on inspection, it looks as though an area of epithelialized tissue was included in the measurements. We have been using PolyMem Ag on the wound surfaces with a total contact cast to the left. 12/02/2021: It appears that the intake personnel are including epithelialized tissue in his wound measurements; the right wound is almost completely epithelialized; there is just a crater at the proximal midfoot with some open areas. On the left, he has built up some callus, but the overall wound surface looks good. There is some senescent  skin around the wound margin. He has been in PolyMem Ag bilaterally with a total contact cast on the left. 12/09/2021: The right wound is nearly closed; there is just a small open area at the mid calcaneus. On the left, the wound is smaller with minimal callus buildup. No significant drainage. 12/16/2021: The right calcaneal wound remains minimally open at the mid calcaneus; the rest has epithelialized. On the left, the wound is also a little bit smaller. There is some senescent tissue on the periphery. He is getting his first application of a trial skin substitute called Vendaje today. 12/23/2021: The wound on his right calcaneus is nearly closed; there is just a small area at the most distal aspect of the calcaneus that is open. On the left, the area where we applied to the skin substitute has a healthier-looking bed of granulation tissue. The wound dimensions are not significantly different on this side but the wound surface is improved. 12/30/2021: The wound on the right calcaneus has not changed significantly aside from some accumulation of callus. On the left, the open area is smaller and continues to have an improved surface. He continues to accumulate callus around the wound. He is here for his third application of Vendaje. 01/06/2022: The right calcaneal wound is down to just a couple of millimeters. He continues to accumulate periwound callus. He unfortunately got his cast wet earlier in the week and his left foot is macerated, resulting in some superficial skin loss just distal to the open wound. The open wound itself, however, is much smaller and has a healthier appearing surface. He is here for his fourth application of Vendaje. 01/13/2022: The right calcaneal wound is about the same. Unfortunately, once again, his cast got wet and his foot is again macerated. This is caused the left calcaneal wound to enlarge. He is here for his fifth application of Vendaje. 01/20/2022: The right calcaneal wound  is very small. There is some periwound callus accumulation. He purchased a new cast protector last week and this has been effective in avoiding the maceration that has been occurring on the left. The left calcaneal wound is narrower and has a healthy and viable-appearing surface. He is here for his 6 application of Vendaje. 01/27/2022: The right calcaneal wound is down to just a pinhole. There is some periwound slough and callus. On the left, the wound is narrower and shorter by about a centimeter. The surface is robust and viable-appearing. Unfortunately, the rep for the trial skin substitute product did not provide any for [place] to use today. 02/04/2022: The right calcaneal wound remains unchanged. There is more accumulated callus. On the left, although the intake nurse measured it  a little bit longer, it looks about the same to me. The surface has a layer of slough, but underneath this, there is good granulation tissue. 02/10/2022: The right calcaneus wound is nearly closed. There is still some callus that builds up around the site. The left side looks about the same in terms of dimensions, but the surface is more robust and vital-appearing. 02/16/2022: The area of the right calcaneus that was nearly closed last week has closed, but there is a small opening at the mid foot where it looks like some CHRISTINA, HODSDON ([id number]) [id number] Page 13 of 19 moisture got retained and caused some reopening. The left foot wound is narrower and shallower. Both sites have a fair amount of periwound callus and eschar. 02/24/2022: The small midfoot opening on the right calcaneus is a little bit smaller today. The left foot wound is narrower and shallower. He continues to accumulate periwound callus. No concern for infection. 03/01/2022: The patient came to clinic early because he showered and got his cast wet. Fortunately, there is no significant maceration to his foot but the callus softened  and it looks like the wound on his left calcaneus may be a little bit wider. The wound on his right calcaneus is just a narrow slit. Continued accumulation of periwound callus bilaterally. 03/08/2022: The wound on his right calcaneus is very nearly closed, just a small pinpoint opening under a bit of eschar; the left wound has come in quite a bit, as well. It is narrower and shorter than at our last visit. Still with accumulated callus and eschar bilaterally. 03/17/2022: The right calcaneal wound is healed. The left wound is smaller and the surface itself is very clean, but there is some blue-green staining on the periwound callus, concerning for Pseudomonas aeruginosa. 03/23/2022: The right calcaneal wound remains closed. The left wound continues to contract. No further blue-green staining. Small amount of callus and slough accumulation. 03/28/2022: He came in early today because he had gotten his cast wet. On inspection, the wound itself did not get wet or macerated, just a little bit of the forefoot. The wound itself is basically unchanged. 04/07/2022: The right foot wound remains closed. The left wound is the smallest that I have seen it to date. It is narrower and shorter. It still continues to accumulate slough on the surface. 04/15/2022: There is a band of epithelium now dividing the small left plantar foot wound in 2. There is still some slough on the surface. 04/21/2022: The wound continues to narrow. Just a little bit of slough on the surface. He seems to be responding well to endoform. 04/28/2022: Continued slow contraction of the wound. There is a little slough on the surface and some periwound callus. We have been using endoform and total contact cast. 05/05/2022: The wound appears to have stalled. There is slough and some periwound eschar/callus. No concern for infection, however. 05/12/2022: Unfortunately, his right foot has reopened. It is located at the most posterior aspect of his surgical  incision. The area was noted to have drainage coming from it when his padding was removed today. Underneath some callus and senescent skin, there is an opening. No purulent drainage or malodor. On the left foot, the wound is again unchanged. There is some light blue staining on the callus, but no malodor or purulent drainage. 10/13; right and left heel remanence of extensive plantar foot wounds. These are better than I remember by quite a big margin however he is still left with wounds  on the left plantar heel and the right plantar heel. Been using endoform bilaterally. A culture was done that showed apparently Pseudomonas but we are still waiting for the [facility] antibiotic to use gentamicin today. He is still very active by description I am not sure about the offloading of his noncasted right foot 10/20; both wounds right and left heel debrided not much change from last week. [name] Echevaria has arrived which is linezolid, gentamicin and ciprofloxacin we will use this with endoform. T contact cast on the left otal 06/02/2022: Both wounds are smaller today. There is still a fair amount of callus buildup around the right foot ulcer. The left is more superficial and nearly flush with the surrounding tissues. Also with slough and eschar buildup. 06/10/2022: The right sided wound appears to be nearly closed, if not completely so, although it is somewhat difficult to tell given the abnormal tissue and scarring in his foot. There is a fair amount of callus and crust accumulation. On the left, the wound looks about the same, again with callus and slough. He has an appointment next Thursday with Dr. Annamary Rummage in podiatry; I am hopeful that there may be some reconstructive options available for Mr. Ramaley. 06/16/2022: Both wounds have some eschar and callus accumulation. The right sided wound is extremely narrow and barely open; the left is narrower than last week. There is a little bit of slough. He has his  appointment in podiatry later today. 06/23/2022: The patient met with Dr. Annamary Rummage last week and unfortunately, there are no reconstructive options that he believes would be helpful. He did order an MRI to evaluate for osteomyelitis and fortunately, none was seen. The left sided wound is a little bit shorter and narrower today. The right sided wound is about the same. There is callus and eschar accumulation bilaterally. 06/29/2022: Both feet have improved from last week. There is epithelium making a valiant effort to creep across the surface on the left. The right side looks like it got a little dry and the deep crevasse in his midfoot has cracked. Both have eschar and there is some slough on the left. 07/07/2022: Both feet have improved. There is epithelium completely covering the calcaneus on the right with just a small opening in the crevasse in his midfoot. On the left, the open area of tissue is smaller but he continues to build up callus/eschar and slough. 07/15/2022: The opening in the midfoot on the right is about the same size, covered with eschar and a little bit of slough. The open portion of the left wound is narrower and shorter with a bit of slough buildup. He admits to being on his feet more than recommended. 12/14; as far as I can tell everything on the right foot is closed. There is some eschar I removed some of this I cannot identify any open wound here. As usual this will be a very vulnerable area going forward. On the left this looks really quite healthy. I was pleasantly surprised to see how good this looked. Wound is certainly smaller and there appears to be healthy epithelialization. He has been using Promogran on the right and endoform on the left. He has been offloading the right foot with a heel offloading boot and he has a running shoe on the right foot 08/04/2022: The right foot remains closed. He has a thick cushioned insole in his sneaker. The left sided wound is smaller  with just some slough and eschar accumulation. He is wearing the heel off  loader on this foot. 08/15/2022: The right foot remains closed. The left wound has narrowed further. There is some slough and eschar accumulation. 08/25/2022: We put him in a peg assist shoe insert and as a result, he has more epithelialization of the ulcer with minimal slough and eschar accumulation. 09/01/2022: The wound is smaller by about half this week. Still with some slough on the surface. The peg assist shoe seems to be doing a remarkable job of adequately offloading the site. 09/08/2022: There is a little bit more epithelium coming in. There is some slough and callus buildup. 09/16/2022: The wound measures about the same size, but the epithelium that has grown in looks more robust and stronger. There is some slough and callus buildup. 09/23/2022: The wound remains about the same size. The skin edges are looking rather senescent. 09/29/2022: The aggressive debridement I performed last week seems to have been effective. The wound is smaller and has significantly less slough TERREON, GELDER ([id number]) [id number] Page 14 of 19 accumulation. 10/06/2022: There has been quite a bit of epithelialization since last week. There are still some open areas with slough accumulation. There is some callus buildup around the perimeter. 10/13/2022: Continued improvement. Minimal callus accumulation. 3/14; patient presents for follow-up. He has been using endoform to the wound bed without issues. He is using a surgical shoe with peg assist for offloading. 10/27/2022: The wound dimensions are stable. There is some senescent skin accumulation around the perimeter. 11/04/2022: Last week I performed a very aggressive debridement in an effort to stimulate the healing cascade. As has been the case when I have done this before, the wound has responded well. There has been epithelialization and contraction of the wound. There  are just a couple of small open areas. 11/10/2022: Unfortunately, his foot got wet secondary to sweating and there has been some breakdown of the tissue, particularly the skin just adjacent to the main ulcer. 11/18/2022: We continue to struggle with moisture-related tissue breakdown around the perimeter of his wound. This has caused the thin epithelium that had formed on the surface to disintegrate. The underlying surface of the wound has good granulation tissue. 11/25/2022: The edges of the wound are much less macerated, but the surface is actually looking a little bit dry. There is slough accumulation. No obvious signs of infection. 12/01/2022: The wound edges are more macerated and broken down, making the wound larger. The surface has some slough on it. 12/08/2020: The wound looks better this week. There is significantly less maceration and moisture-related tissue breakdown. There is some slough on the wound surface. 12/15/2022: The wound continues to improve. There is almost no moisture-related tissue breakdown. There is epithelium beginning to fill in again from the edges. Light slough on the wound surface. 12/22/2022: More epithelium has filled him from around the edges. There is some slough on the surface. No evidence of moisture-related tissue breakdown. 01/05/2023: There has been more epithelialization, particularly at the posterior calcaneal aspect of the wound. There has been a little bit of moisture accumulation with minimal maceration. 01/12/2023: More epithelium has filled in. There is very minimal accumulation of slough. 01/20/2023: The wound is measuring a little bit smaller today. It did measure deeper, but this appears to be secondary to some heaped up senescent skin around the edges. The epithelium that has been present at the posterior aspect of the wound seems to be a bit more robust with greater integrity. 01/26/2023: He reports intense itching to the skin around the wound and  there has been  some breakdown here. It looks fungal in nature. The wound itself looks pretty good with improved epithelialization with just some slough buildup. He has a little callus along the wound edges. 02/02/2023: He has been treating the periwound skin with an over-the-counter athlete's foot medication and notes significant improvement in his pruritus. The skin looks better here, as well. The wound continues to epithelialize. There is light slough accumulation. 02/10/2023: The periwound skin still looks fairly angry. He reports multiple small blisters that have opened up. He is not having the intense itching that he had prior, however. There has been more epithelialization at the calcaneus and there is minimal slough accumulation. 02/16/2023: The periwound skin is improving. An online review of dermatology rash images suggest that this may be hand-foot-and-mouth disease, isolated to the feet; the patient says he actually had this as a child. The calcaneus continues to epithelialize and the tissue was quite robust here. Very minimal slough accumulation on the open portion of the wound with a little bit of periwound callus buildup. 03/03/2023: The wound measurements are about the same, but there is more epithelium making its way to the surface. There is a little bit of crust and eschar around the edges and minimal slough on the surface. 03/09/2023: His dressing was applied rather haphazardly and the drawtex was not applied. The Optifoam was also reversed such that the orange side was in contact with his bare skin. He has some maceration around the wound edges, but this did not appear to result in any tissue breakdown. There is a little bit of slough on the surface and minimal eschar around the edges. 03/15/2023: The wound measured narrower today. There is some slough accumulation on the surface and some eschar around the edges. 03/23/2023: The length of the wound remains stable, but it continues to narrow. The skin that  has covered the posterior aspect of the calcaneus is more robust and has better tensile integrity. Minimal slough and eschar accumulation. 03/30/2023: The wound length has decreased. This seems to be primarily due to more epithelium covering the posterior aspect of the site. Minimal slough and eschar buildup. 04/07/2023: The wound measured a little bit smaller again today. There is minimal slough accumulation with just a little bit of eschar and callus around the edges. 04/13/2023: The lateral aspect of his wound is starting to epithelialize. There is very minimal slough on the surface. Moisture control is good. 04/20/2023: Continued, albeit extremely slow, encroachment of epithelium as of the wound surface. Minimal slough. Moisture balance is good. 04/27/2023: More epithelium is filling in from the heel area. There is a little bit of slough on the surface with some periwound callus and eschar. He did note a little bit of drainage coming from his right foot and there is a small crack in the skin extending medially from the main site of his prior ulceration. 05/04/2023: The wounds are stable this week. No significant change. There is a little bit of callus buildup around the edges of each site and some thin slough on the surface on the left foot. 05/11/2023: The skin on the right heel is filling in further. There is a little bit of slough accumulation on both surfaces along with periwound callus accumulation. 05/18/2023: Both wounds are smaller today. The right foot is down to just a sliver of opening. 05/25/2023: Epithelium continues to advance from the calcaneal area towards the midfoot on the left. On the right, there is tiniest crack of an opening that  remains. 06/01/2023: No significant change bilaterally. He has built up quite a bit of callus on the calcaneal surface of the right foot. 06/15/2023: He missed his appointment last week. The wounds measured larger today. There is some slough and eschar  accumulation on both wounds. He was discussed in the [facility] wound camp last week. The consensus was in favor of proceeding with plastic surgery intervention but they also suggested AARAN, RUNDE ([id number]) [id number] Page 15 of 19 repeating the MRI imaging of his heels to ensure no osteomyelitis present. 06/22/2023: The wounds are basically stable. His MRI is scheduled for tomorrow and his surgery for a week from tomorrow, 22 November. Patient History Information obtained from Patient. Family History Cancer - Maternal Grandparents, Diabetes - Father,Paternal Grandparents, Heart Disease - Maternal Grandparents, Hypertension - Father,Paternal Grandparents, Lung Disease - Siblings, No family history of Hereditary Spherocytosis, Kidney Disease, Seizures, Stroke, Thyroid Problems, Tuberculosis. Social History Never smoker, Marital Status - Married, Alcohol Use - Never, Drug Use - No History, Caffeine Use - Daily - tea, soda. Medical History Eyes Denies history of Cataracts, Glaucoma, Optic Neuritis Ear/Nose/Mouth/Throat Denies history of Chronic sinus problems/congestion, Middle ear problems Hematologic/Lymphatic Denies history of Anemia, Hemophilia, Human Immunodeficiency Virus, Lymphedema, Sickle Cell Disease Respiratory Patient has history of Asthma Denies history of Aspiration, Chronic Obstructive Pulmonary Disease (COPD), Pneumothorax, Sleep Apnea, Tuberculosis Cardiovascular Patient has history of Angina - with COVID, Hypertension Denies history of Arrhythmia, Congestive Heart Failure, Coronary Artery Disease, Deep Vein Thrombosis, Hypotension, Myocardial Infarction, Peripheral Arterial Disease, Peripheral Venous Disease, Phlebitis, Vasculitis Gastrointestinal Denies history of Cirrhosis , Colitis, Crohns, Hepatitis A, Hepatitis B, Hepatitis C Endocrine Denies history of Type I Diabetes, Type II Diabetes Genitourinary Denies history of End Stage  Renal Disease Immunological Denies history of Lupus Erythematosus, Raynauds, Scleroderma Integumentary (Skin) Denies history of History of Burn Musculoskeletal Denies history of Gout, Rheumatoid Arthritis, Osteoarthritis, Osteomyelitis Neurologic Denies history of Dementia, Neuropathy, Quadriplegia, Paraplegia, Seizure Disorder Oncologic Denies history of Received Chemotherapy, Received Radiation Psychiatric Denies history of Anorexia/bulimia, Confinement Anxiety Hospitalization/Surgery History - COVID PNA 07/22/2019- 11/14/2019. - 03/27/2020 wound debridement/ skin graft. Medical A Surgical History Notes nd Constitutional Symptoms (General Health) COVID PNA 07/22/2019-11/14/2019 VENT ECMO, foot drop left foot , Genitourinary kidney stone Psychiatric anxiety Objective Constitutional no acute distress. Vitals Time Taken: 10:36 AM, Height: 69 in, Weight: 280 lbs, BMI: 41.3, Temperature: 98.6 F, Pulse: 84 bpm, Respiratory Rate: 18 breaths/min, Blood Pressure: 120/79 mmHg. Respiratory Normal work of breathing on room air.. General Notes: 06/22/2023: The wounds are basically stable. Integumentary (Hair, Skin) Wound #2 status is Open. Original cause of wound was Pressure Injury. The date acquired was: 10/07/2019. The wound has been in treatment 153 weeks. The wound is located on the Left Calcaneus. The wound measures 3cm length x 1.4cm width x 0.1cm depth; 3.299cm^2 area and 0.33cm^3 volume. There is Fat Layer (Subcutaneous Tissue) exposed. There is no tunneling or undermining noted. There is a medium amount of purulent drainage noted. The wound margin is distinct with the outline attached to the wound base. There is large (67-100%) pink granulation within the wound bed. There is a small (1-33%) amount of necrotic tissue within the wound bed including Adherent Slough. The periwound skin appearance had no abnormalities noted for moisture. The periwound skin appearance had no abnormalities  noted for color. The periwound skin appearance exhibited: Callus, Scarring. The periwound skin appearance did not exhibit: KASIN, MALVIN ([id number]) [id number] Page 16 of 19 Excoriation, Induration, Rash. Periwound temperature was  noted as No Abnormality. The periwound has tenderness on palpation. Wound #5 status is Open. Original cause of wound was Gradually Appeared. The date acquired was: 04/27/2023. The wound has been in treatment 8 weeks. The wound is located on the Right,Plantar Foot. The wound measures 1.1cm length x 0.3cm width x 0.1cm depth; 0.259cm^2 area and 0.026cm^3 volume. There is Fat Layer (Subcutaneous Tissue) exposed. There is no tunneling or undermining noted. There is a medium amount of serosanguineous drainage noted. The wound margin is distinct with the outline attached to the wound base. There is large (67-100%) red granulation within the wound bed. There is a small (1-33%) amount of necrotic tissue within the wound bed including Adherent Slough. The periwound skin appearance had no abnormalities noted for color. The periwound skin appearance exhibited: Callus, Scarring, Dry/Scaly. The periwound skin appearance did not exhibit: Rash. Periwound temperature was noted as No Abnormality. The periwound has tenderness on palpation. 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 There was a Excisional Skin/Subcutaneous Tissue Debridement with a total area of 3.3 sq cm performed by [name], MD. With the following instrument(s): Curette to remove Viable and Non-Viable tissue/material. Material removed includes Subcutaneous Tissue and Slough and after achieving pain control using Lidocaine  4% T opical Solution. No specimens were taken. A time out was conducted at 10:55, prior to the start of the procedure. A Minimum amount of bleeding was controlled with Pressure. The procedure was tolerated well. Post Debridement Measurements: 3cm length x 1.4cm width x 0.1cm depth; 0.33cm^3 volume. Post debridement Stage noted as Category/Stage III. Character of Wound/Ulcer Post Debridement is improved. Post procedure Diagnosis Wound #2: Same as Pre-Procedure Wound #5 Pre-procedure diagnosis of Wound #5 is a Pressure Ulcer located on the Right,Plantar Foot . There was a Excisional Skin/Subcutaneous Tissue Debridement with a total area of 0.26 sq cm performed by [name], MD. With the following instrument(s): Curette to remove Viable and Non-Viable tissue/material. Material removed includes Subcutaneous Tissue and Slough and after achieving pain control using Lidocaine 4% T opical Solution. No specimens were taken. A time out was conducted at 10:55, prior to the start of the procedure. A Minimum amount of bleeding was controlled with Pressure. The procedure was tolerated well with a pain level of 0 throughout and a pain level of 0 following the procedure. Post Debridement Measurements: 1.1cm length x 0.3cm width x 0.1cm depth; 0.026cm^3 volume. Post debridement Stage noted as Category/Stage II. Character of Wound/Ulcer Post Debridement is improved. Post procedure Diagnosis Wound #5: Same as Pre-Procedure Plan Follow-up Appointments: Return Appointment in: - call to schedule appointment if needed after plastic surgery Anesthetic: Wound #2 Left Calcaneus: (In clinic) Topical Lidocaine 4% applied to wound bed - In clinic Bathing/ Shower/ Hygiene: May shower and wash wound with soap and water. Off-Loading: Other: - keep pressure off of the bottom of your feet. Elevate legs throughout the day. Use the Shoe with the PegAssist off-loading insole Additional Orders / Instructions: Follow  Nutritious Diet - Try to get 70-100 grams of Protein a day+ Coupons for Juven sent with patient. Other: - 500 mg per day to start, then increase to 500mg  twice a day. Also consider adding a zinc supplement. Juven Shake 1-2 times daily. WOUND #2: - Calcaneus Wound Laterality:  Left Cleanser: Normal Saline (Generic) 1 x Per Day/Other:until healed Discharge Instructions: Cleanse the wound with Normal Saline prior to applying a clean dressing using gauze sponges, not tissue or cotton balls. Cleanser: Wound Cleanser (Generic) 1 x Per Day/Other:until healed Discharge Instructions: Cleanse the wound with wound cleanser prior to applying a clean dressing using gauze sponges, not tissue or cotton balls. Peri-Wound Care: Ketoconazole Cream 2% (Generic) 1 x Per Day/Other:until healed Discharge Instructions: Apply Ketoconazole mixed with zinc to the periwound Peri-Wound Care: Zinc Oxide Ointment 30g tube (Generic) 1 x Per Day/Other:until healed Discharge Instructions: Apply Zinc Oxide to periwound with each dressing change Topical: Gentamicin (Generic) 1 x Per Day/Other:until healed Discharge Instructions: apply to the wound bed Topical: Ketoconazole Cream 2% 1 x Per Day/Other:until healed Discharge Instructions: Apply Ketoconazole to periwound Prim Dressing: Maxorb Extra Ag+ Alginate Dressing, 4x4.75 (in/in) (Generic) 1 x Per Day/Other:until healed ary Discharge Instructions: Apply to wound bed as instructed Secondary Dressing: Drawtex 4x4 in (Generic) 1 x Per Day/Other:until healed Discharge Instructions: Apply over primary dressing as directed. Secondary Dressing: Optifoam Non-Adhesive Dressing, 4x4 in (Generic) 1 x Per Day/Other:until healed Discharge Instructions: Apply over primary dressing as directed. Secondary Dressing: Woven Gauze Sponge, Non-Sterile 4x4 in (Generic) 1 x Per Day/Other:until healed CALIB, WEGLEY ([id number]) [id number] Page 17 of 19 Discharge  Instructions: Apply over primary dressing as directed. Secured With: 64M Medipore H Soft Cloth Surgical T ape, 4 x 10 (in/yd) (Generic) 1 x Per Day/Other:until healed Discharge Instructions: Secure with tape as directed. Com pression Wrap: Kerlix Roll 4.5x3.1 (in/yd) (Generic) 1 x Per Day/Other:until healed Discharge Instructions: Apply Kerlix and Coban compression as directed. Add-Ons: [employer] Offloading Boot, Size Regular 1 x Per Day/Other:until healed WOUND #5: - Foot Wound Laterality: Plantar, Right Peri-Wound Care: Ketoconazole Cream 2% 1 x Per Day/30 Days Discharge Instructions: Apply Ketoconazole to periwound as needed Peri-Wound Care: Zinc Oxide Ointment 30g tube 1 x Per Day/30 Days Discharge Instructions: Apply Zinc Oxide to periwound with each dressing change Topical: Gentamicin 1 x Per Day/30 Days Discharge Instructions: As directed by physician Prim Dressing: Maxorb Extra Ag+ Alginate Dressing, 4x4.75 (in/in) 1 x Per Day/30 Days ary Discharge Instructions: Apply to wound bed as instructed Secondary Dressing: Drawtex 4x4 in 1 x Per Day/30 Days Discharge Instructions: Apply over primary dressing as directed. Secondary Dressing: Optifoam Non-Adhesive Dressing, 4x4 in 1 x Per Day/30 Days Discharge Instructions: Apply over primary dressing as directed. Secured With: [employer], 4.5x3.1 (in/yd) 1 x Per Day/30 Days Discharge Instructions: Secure with Kerlix as directed. 06/22/2023: The wounds are basically stable. His MRI is scheduled for tomorrow and his surgery for a week from tomorrow, 22 November. I used a curette to debride slough, callus, and subcutaneous tissue from each of the plantar foot wounds. We will continue topical gentamicin with silver alginate and drawtex with peg assist offloading inserts. He will follow-up after his surgery, once he is released from Dr. Lubertha Basque care. Electronic Signature(s) Signed: 06/22/2023 11:10:16 AM By: [name] MD  FACS Entered By: [name] on 06/22/2023 08:10:16 -------------------------------------------------------------------------------- HxROS Details Patient Name: Date of Service: [name], [name] 06/22/2023 10:45 A M Medical Record Number: [id number] Patient Account Number: [id number] Date of Birth/Sex: Treating RN: 1976-04-01 (47 y.o. M) Primary Care Provider: Dorinda Hill Other Clinician: Referring Provider: Treating Provider/Extender: Jana Hakim in Treatment: 153 Information Obtained From Patient Constitutional Symptoms (General Health) Medical History: Past Medical History Notes: COVID PNA 07/22/2019-11/14/2019 V[facility] ECMO, foot drop left  foot , Eyes Medical History: Negative for: Cataracts; Glaucoma; Optic Neuritis Ear/Nose/Mouth/Throat Medical History: Negative for: Chronic sinus problems/congestion; Middle ear problems Hematologic/Lymphatic Medical History: Negative for: Anemia; Hemophilia; Human Immunodeficiency Virus; Lymphedema; Sickle Cell Disease Respiratory Medical History: Positive for: Asthma LYRIK, DICKOW ([id number]) [id number] Page 18 of 19 Negative for: Aspiration; Chronic Obstructive Pulmonary Disease (COPD); Pneumothorax; Sleep Apnea; Tuberculosis Cardiovascular Medical History: Positive for: Angina - with COVID; Hypertension Negative for: Arrhythmia; Congestive Heart Failure; Coronary Artery Disease; Deep Vein Thrombosis; Hypotension; Myocardial Infarction; Peripheral Arterial Disease; Peripheral Venous Disease; Phlebitis; Vasculitis Gastrointestinal Medical History: Negative for: Cirrhosis ; Colitis; Crohns; Hepatitis A; Hepatitis B; Hepatitis C Endocrine Medical History: Negative for: Type I Diabetes; Type II Diabetes Genitourinary Medical History: Negative for: End Stage Renal Disease Past Medical History Notes: kidney stone Immunological Medical History: Negative for: Lupus  Erythematosus; Raynauds; Scleroderma Integumentary (Skin) Medical History: Negative for: History of Burn Musculoskeletal Medical History: Negative for: Gout; Rheumatoid Arthritis; Osteoarthritis; Osteomyelitis Neurologic Medical History: Negative for: Dementia; Neuropathy; Quadriplegia; Paraplegia; Seizure Disorder Oncologic Medical History: Negative for: Received Chemotherapy; Received Radiation Psychiatric Medical History: Negative for: Anorexia/bulimia; Confinement Anxiety Past Medical History Notes: anxiety Immunizations Pneumococcal Vaccine: Received Pneumococcal Vaccination: No Implantable Devices None Hospitalization / Surgery History Type of Hospitalization/Surgery COVID PNA 07/22/2019- 11/14/2019 03/27/2020 wound debridement/ skin graft Family and Social History Cancer: Yes - Maternal Grandparents; Diabetes: Yes - Father,Paternal Grandparents; Heart Disease: Yes - Maternal Grandparents; Hereditary Spherocytosis: No; Hypertension: Yes - Father,Paternal Grandparents; Kidney Disease: No; Lung Disease: Yes - Siblings; Seizures: No; Stroke: No; Thyroid Problems: No; Tuberculosis: No; Never smoker; Marital Status - Married; Alcohol Use: Never; Drug Use: No History; Caffeine Use: Daily - tea, soda; Financial Concerns: No; Food, Clothing or Shelter Needs: No; Support System Lacking: No; Transportation Concerns: No SHIV, LAUER ([id number]) [id number] Page 19 of 19 Electronic Signature(s) Signed: 06/22/2023 3:32:46 PM By: [name] MD FACS Entered By: [name] on 06/22/2023 08:08:36 -------------------------------------------------------------------------------- SuperBill Details Patient Name: Date of Service: [name], [name] 06/22/2023 Medical Record Number: [id number] Patient Account Number: [id number] Date of Birth/Sex: Treating RN: 09/29/1975 (47 y.o. M) Primary Care Provider: Dorinda Hill Other Clinician: Referring  Provider: Treating Provider/Extender: Jana Hakim in Treatment: 153 Diagnosis Coding ICD-10 Codes Code Description [phone] Non-pressure chronic ulcer of other part of right foot with fat layer exposed L97.522 Non-pressure chronic ulcer of other part of left foot with fat layer exposed Facility Procedures : CPT4 Code: [id number] Description: 11042 - DEB SUBQ TISSUE 20 SQ CM/< ICD-10 Diagnosis Description L97.512 Non-pressure chronic ulcer of other part of right foot with fat layer exposed L97.522 Non-pressure chronic ulcer of other part of left foot with fat layer exposed Modifier: Quantity: 1 Physician Procedures : CPT4 Code Description Modifier [id number] - WC PHYS LEVEL 4 - EST PT ICD-10 Diagnosis Description L97.512 Non-pressure chronic ulcer of other part of right foot with fat layer exposed L97.522 Non-pressure chronic ulcer of other part of left foot  with fat layer exposed Quantity: 1 : [id number] 11042 - WC PHYS SUBQ TISS 20 SQ CM ICD-10 Diagnosis Description L97.512 Non-pressure chronic ulcer of other part of right foot with fat layer exposed L97.522 Non-pressure chronic ulcer of other part of left foot with fat layer exposed Quantity: 1 Electronic Signature(s) Signed: 06/22/2023 11:10:42 AM By: [name] MD FACS Entered By: [name] on 06/22/2023 08:10:41

## 2023-06-22 NOTE — Progress Notes (Signed)
[name], [name] ([id number]) [id number] Page 1 of 8 Visit Report for 06/22/2023 Arrival Information Details Patient Name: Date of Service: [name], [name] [name] 06/22/2023 10:45 A M Medical Record Number: [id number] Patient Account Number: [id number] Date of Birth/Sex: Treating RN: August 30, 1975 (47 y.o. [name] Primary Care Atticus Lemberger: Dorinda Hill Other Clinician: Referring Teresina Bugaj: Treating Moneisha Vosler/Extender: Jana Hakim in Treatment: 153 Visit Information History Since Last Visit Added or deleted any medications: No Patient Arrived: Ambulatory Any new allergies or adverse reactions: No Arrival Time: 10:34 Had a fall or experienced change in No Accompanied By: family activities of daily living that may affect Transfer Assistance: None risk of falls: Patient Identification Verified: Yes Signs or symptoms of abuse/neglect since last No Secondary Verification Process Completed: Yes visito Patient Requires Transmission-Based Precautions: No Hospitalized since last visit: No Patient Has Alerts: No Implantable device outside of the clinic No excluding cellular tissue based products placed in the center since last visit: Has Dressing in Place as Prescribed: Yes Has Footwear/Offloading in Place as Yes Prescribed: Left: Removable Cast Walker/Walking Boot Pain Present Now: Yes Electronic Signature(s) Signed: 06/22/2023 4:32:10 PM By: Zenaida Deed RN, BSN Entered By: Zenaida Deed on 06/22/2023 10:35:43 -------------------------------------------------------------------------------- Encounter Discharge Information Details Patient Name: Date of Service: [name], [name] [place] E. 06/22/2023 10:45 A M Medical Record Number: [id number] Patient Account Number: [id number] Date of Birth/Sex: Treating RN: [birth year] (47 y.o. [name] Primary Care Kameran Lallier: Dorinda Hill Other Clinician: Referring Evalin Shawhan: Treating  Allye Hoyos/Extender: Jana Hakim in Treatment: [phone] Encounter Discharge Information Items Post Procedure Vitals Discharge Condition: Stable Temperature (F): 98.6 Ambulatory Status: Wheelchair Pulse (bpm): 84 Discharge Destination: Home Respiratory Rate (breaths/min): 18 Transportation: Private Auto Blood Pressure (mmHg): 120/79 Accompanied By: spouse Schedule Follow-up Appointment: Yes Clinical Summary of Care: Patient Declined Electronic Signature(s) Signed: 06/22/2023 4:32:10 PM By: Zenaida Deed RN, BSN Entered By: Zenaida Deed on 06/22/2023 11:18:45 [name] ([id number]) [id number] Page 2 of 8 -------------------------------------------------------------------------------- Multi Wound Chart Details Patient Name: Date of Service: [name] [name] [name] 06/22/2023 10:45 A M Medical Record Number: [id number] Patient Account Number: [id number] Date of Birth/Sex: Treating RN: 1976-06-26 (47 y.o. M) Primary Care Virgina Deakins: Dorinda Hill Other Clinician: Referring Westly Hinnant: Treating Daymien Goth/Extender: Jana Hakim in Treatment: 153 Vital Signs Height(in): 69 Pulse(bpm): 84 Weight(lbs): 280 Blood Pressure(mmHg): 120/79 Body Mass Index(BMI): 41.3 Temperature(F): 98.6 Respiratory Rate(breaths/min): 18 [2:Photos:] [N/A:N/A] Left Calcaneus Right, Plantar Foot N/A Wound Location: Pressure Injury Gradually Appeared N/A Wounding Event: Pressure Ulcer Pressure Ulcer N/A Primary Etiology: Asthma, Angina, Hypertension Asthma, Angina, Hypertension N/A Comorbid History: 10/07/2019 04/27/2023 N/A Date Acquired: 153 8 N/A Weeks of Treatment: Open Open N/A Wound Status: No No N/A Wound Recurrence: 3x1.4x0.1 1.1x0.3x0.1 N/A Measurements L x W x D (cm) 3.299 0.259 N/A A (cm) : rea 0.33 0.026 N/A Volume (cm) : 87.80% -735.50% N/A % Reduction in A rea: 98.80% -766.70% N/A % Reduction in  Volume: Category/Stage III Category/Stage II N/A Classification: Medium Medium N/A Exudate A mount: Purulent Serosanguineous N/A Exudate Type: yellow, brown, green red, brown N/A Exudate Color: Distinct, outline attached Distinct, outline attached N/A Wound Margin: Large (67-100%) Large (67-100%) N/A Granulation A mount: Pink Red N/A Granulation Quality: Small (1-33%) Small (1-33%) N/A Necrotic A mount: Fat Layer (Subcutaneous Tissue): Yes Fat Layer (Subcutaneous Tissue): Yes N/A Exposed Structures: Fascia: No Tendon: No Muscle: No Joint: No Bone: No Small (1-33%) Small (1-33%) N/A Epithelialization: Debridement - Excisional Debridement - Excisional N/A Debridement:  Pre-procedure Verification/Time Out 10:55 10:55 N/A Taken: Lidocaine 4% Topical Solution Lidocaine 4% Topical Solution N/A Pain Control: Subcutaneous, Slough Subcutaneous, Slough N/A Tissue Debrided: Skin/Subcutaneous Tissue Skin/Subcutaneous Tissue N/A Level: 3.3 0.26 N/A Debridement A (sq cm): rea Curette Curette N/A Instrument: Minimum Minimum N/A Bleeding: Pressure Pressure N/A Hemostasis A chieved: N/A 0 N/A Procedural Pain: N/A 0 N/A Post Procedural Pain: Procedure was tolerated well Procedure was tolerated well N/A Debridement Treatment Response: 3x1.4x0.1 1.1x0.3x0.1 N/A Post Debridement Measurements L x W x D (cm) 0.33 0.026 N/A Post Debridement Volume: (cm) Category/Stage III Category/Stage II N/A Post Debridement Stage: Callus: Yes Callus: Yes N/A Periwound Skin TextureNAZIM, [name] ([id number]) [id number].pdf Page 3 of 8 Scarring: Yes Scarring: Yes Excoriation: No Rash: No Induration: No Crepitus: No Rash: No Dry/Scaly: Yes Dry/Scaly: Yes N/A Periwound Skin Moisture: Maceration: No Atrophie Blanche: No Hemosiderin Staining: No N/A Periwound Skin Color: Cyanosis: No Ecchymosis: No Erythema: No Hemosiderin Staining: No Mottled: No Pallor:  No Rubor: No No Abnormality No Abnormality N/A Temperature: Yes Yes N/A Tenderness on Palpation: Debridement Debridement N/A Procedures Performed: Treatment Notes Electronic Signature(s) Signed: 06/22/2023 11:06:07 AM By: [name] MD FACS Entered By: [name] on 06/22/2023 11:06:07 -------------------------------------------------------------------------------- Multi-Disciplinary Care Plan Details Patient Name: Date of Service: [name], [name] [name] 06/22/2023 10:45 A M Medical Record Number: [id number] Patient Account Number: [id number] Date of Birth/Sex: Treating RN: Dec 23, 1975 (47 y.o. [name] Primary Care Devlyn Retter: Dorinda Hill Other Clinician: Referring Loreda Silverio: Treating Deangelo Berns/Extender: Jana Hakim in Treatment: 153 Multidisciplinary Care Plan reviewed with physician Active Inactive Wound/Skin Impairment Nursing Diagnoses: Impaired tissue integrity Knowledge deficit related [name] ulceration/compromised skin integrity Goals: Patient/caregiver will verbalize understanding of skin care regimen Date Initiated: 07/15/2020 Target Resolution Date: 06/29/2023 Goal Status: Active Ulcer/skin breakdown will have a volume reduction of 30% by week 4 Date Initiated: 07/15/2020 Date Inactivated: 08/20/2020 Target Resolution Date: 09/03/2020 Goal Status: Unmet Unmet Reason: no major changes. Ulcer/skin breakdown will heal within 14 weeks Date Initiated: 12/04/2020 Date Inactivated: 12/10/2020 Target Resolution Date: 12/10/2020 Unmet Reason: wounds still open at 14 Goal Status: Unmet weeks and today 21 weeks. Interventions: Assess patient/caregiver ability [name] obtain necessary supplies Assess patient/caregiver ability [name] perform ulcer/skin care regimen upon admission and as needed Assess ulceration(s) every visit Provide education on ulcer and skin care Treatment Activities: Skin care regimen initiated : 07/15/2020 Topical wound management  initiated : 07/15/2020 Notes: [name], [name] ([id number]) [phone].pdf Page 4 of 8 Electronic Signature(s) Signed: 06/22/2023 4:32:10 PM By: Zenaida Deed RN, BSN Entered By: Zenaida Deed on 06/22/2023 10:50:06 -------------------------------------------------------------------------------- Pain Assessment Details Patient Name: Date of Service: [name], [name] [name] 06/22/2023 10:45 A M Medical Record Number: [id number] Patient Account Number: [id number] Date of Birth/Sex: Treating RN: 1976-07-23 (47 y.o. [name] Primary Care Ashiya Kinkead: Dorinda Hill Other Clinician: Referring Sherrey North: Treating Tryniti Laatsch/Extender: Jana Hakim in Treatment: 153 Active Problems Location of Pain Severity and Description of Pain Patient Has Paino Yes Site Locations Pain Location: Pain in Ulcers With Dressing Change: Yes Duration of the Pain. Constant / Intermittento Intermittent Rate the pain. Current Pain Level: 4 Worst Pain Level: 5 Least Pain Level: 0 Character of Pain Describe the Pain: Aching, Tender Pain Management and Medication Current Pain Management: Medication: Yes Is the Current Pain Management Adequate: Adequate Rest: Yes How does your wound impact your activities of daily livingo Sleep: No Bathing: No Appetite: No Relationship With Others: No Bladder Continence: No Emotions: No Bowel Continence: No  Work: No Toileting: No Drive: No Dressing: No Hobbies: No Psychologist, prison and probation services) Signed: 06/22/2023 4:32:10 PM By: Zenaida Deed RN, BSN Entered By: Zenaida Deed on 06/22/2023 10:36:42 Patient/Caregiver Education Details -------------------------------------------------------------------------------- [name] ([id number]) [id number] Page 5 of 8 Patient Name: Date of Service: [name] [place] 11/14/2024andnbsp10:45 A M Medical Record Number: [id number] Patient Account Number:  [id number] Date of Birth/Gender: Treating RN: 10/04/1975 (47 y.o. [name] Primary Care Physician: Dorinda Hill Other Clinician: Referring Physician: Treating Physician/Extender: Jana Hakim in Treatment: 153 Education Assessment Education Provided [name]: Patient Education Topics Provided Offloading: Methods: Explain/Verbal Responses: Reinforcements needed, State content correctly Wound/Skin Impairment: Methods: Explain/Verbal Responses: Reinforcements needed, State content correctly Electronic Signature(s) Signed: 06/22/2023 4:32:10 PM By: Zenaida Deed RN, BSN Entered By: Zenaida Deed on 06/22/2023 10:50:30 -------------------------------------------------------------------------------- Wound Assessment Details Patient Name: Date of Service: [name], [name] [place] E. 06/22/2023 10:45 A M Medical Record Number: [id number] Patient Account Number: [id number] Date of Birth/Sex: Treating RN: 1975/12/18 (47 y.o. [name] Primary Care Pairlee Sawtell: Dorinda Hill Other Clinician: Referring Desarae Placide: Treating Elliott Quade/Extender: Jana Hakim in Treatment: 153 Wound Status Wound Number: 2 Primary Etiology: Pressure Ulcer Wound Location: Left Calcaneus Wound Status: Open Wounding Event: Pressure Injury Comorbid History: Asthma, Angina, Hypertension Date Acquired: 10/07/2019 Weeks Of Treatment: 153 Clustered Wound: No Photos Wound Measurements Length: (cm) 3 Width: (cm) 1.4 Depth: (cm) 0.1 Area: (cm) 3.2 Volume: (cm) 0.3 [name], [name] ([id number]) Wound Description Classification: Category/Stage III Wound Margin: Distinct, outline attache Exudate Amount: Medium Exudate Type: Purulent Exudate Color: yellow, brown, green Foul Odor After Cleansing: Slough/Fibrino % Reduction in Area: 87.8% % Reduction in Volume: 98.8% Epithelialization: Small (1-33%) 99 Tunneling: No 3 Undermining:  No [id number] Page 6 of 8 d No No Wound Bed Granulation Amount: Large (67-100%) Exposed Structure Granulation Quality: Pink Fascia Exposed: No Necrotic Amount: Small (1-33%) Fat Layer (Subcutaneous Tissue) Exposed: Yes Necrotic Quality: Adherent Slough Tendon Exposed: No Muscle Exposed: No Joint Exposed: No Bone Exposed: No Periwound Skin Texture Texture Color No Abnormalities Noted: No No Abnormalities Noted: Yes Callus: Yes Temperature / Pain Crepitus: No Temperature: No Abnormality Excoriation: No Tenderness on Palpation: Yes Induration: No Rash: No Scarring: Yes Moisture No Abnormalities Noted: Yes Treatment Notes Wound #2 (Calcaneus) Wound Laterality: Left Cleanser Normal Saline Discharge Instruction: Cleanse the wound with Normal Saline prior [name] applying a clean dressing using gauze sponges, not tissue or cotton balls. Wound Cleanser Discharge Instruction: Cleanse the wound with wound cleanser prior [name] applying a clean dressing using gauze sponges, not tissue or cotton balls. Peri-Wound Care Ketoconazole Cream 2% Discharge Instruction: Apply Ketoconazole mixed with zinc [name] the periwound Zinc Oxide Ointment 30g tube Discharge Instruction: Apply Zinc Oxide [name] periwound with each dressing change Topical Gentamicin Discharge Instruction: apply [name] the wound bed Ketoconazole Cream 2% Discharge Instruction: Apply Ketoconazole [name] periwound Primary Dressing Maxorb Extra Ag+ Alginate Dressing, 4x4.75 (in/in) Discharge Instruction: Apply [name] wound bed as instructed Secondary Dressing Drawtex 4x4 in Discharge Instruction: Apply over primary dressing as directed. Optifoam Non-Adhesive Dressing, 4x4 in Discharge Instruction: Apply over primary dressing as directed. Woven Gauze Sponge, Non-Sterile 4x4 in Discharge Instruction: Apply over primary dressing as directed. Secured With 59M Medipore H Soft Cloth Surgical T ape, 4 x 10  (in/yd) Discharge Instruction: Secure with tape as directed. Compression Wrap Kerlix Roll 4.5x3.1 (in/yd) Discharge Instruction: Apply Kerlix and Coban compression as directed. Compression Stockings [name], [name] ([id number]) [phone].pdf Page 7 of 8 Add-Ons [employer]  Offloading Boot, Size Regular Electronic Signature(s) Signed: 06/22/2023 4:32:10 PM By: Zenaida Deed RN, BSN Entered By: Zenaida Deed on 06/22/2023 10:47:31 -------------------------------------------------------------------------------- Wound Assessment Details Patient Name: Date of Service: [name], [name] [name] 06/22/2023 10:45 A M Medical Record Number: [id number] Patient Account Number: [id number] Date of Birth/Sex: Treating RN: February 13, 1976 (47 y.o. [name] Primary Care Taiwan Talcott: Dorinda Hill Other Clinician: Referring Ingrid Shifrin: Treating Dvaughn Fickle/Extender: Jana Hakim in Treatment: 153 Wound Status Wound Number: 5 Primary Etiology: Pressure Ulcer Wound Location: Right, Plantar Foot Wound Status: Open Wounding Event: Gradually Appeared Comorbid History: Asthma, Angina, Hypertension Date Acquired: 04/27/2023 Weeks Of Treatment: 8 Clustered Wound: No Photos Wound Measurements Length: (cm) 1.1 Width: (cm) 0.3 Depth: (cm) 0.1 Area: (cm) 0.259 Volume: (cm) 0.026 % Reduction in Area: -735.5% % Reduction in Volume: -766.7% Epithelialization: Small (1-33%) Tunneling: No Undermining: No Wound Description Classification: Category/Stage II Wound Margin: Distinct, outline attached Exudate Amount: Medium Exudate Type: Serosanguineous Exudate Color: red, brown Foul Odor After Cleansing: No Slough/Fibrino Yes Wound Bed Granulation Amount: Large (67-100%) Exposed Structure Granulation Quality: Red Fat Layer (Subcutaneous Tissue) Exposed: Yes Necrotic Amount: Small (1-33%) Necrotic Quality: Adherent Slough Periwound Skin Texture Texture  Color No Abnormalities Noted: No No Abnormalities Noted: Yes Callus: Yes Temperature / Pain Rash: No Temperature: No Abnormality Scarring: Yes Tenderness on Palpation: Yes Moisture [name], [name] ([id number]) [phone].pdf Page 8 of 8 No Abnormalities Noted: No Dry / Scaly: Yes Treatment Notes Wound #5 (Foot) Wound Laterality: Plantar, Right Cleanser Peri-Wound Care Ketoconazole Cream 2% Discharge Instruction: Apply Ketoconazole [name] periwound as needed Zinc Oxide Ointment 30g tube Discharge Instruction: Apply Zinc Oxide [name] periwound with each dressing change Topical Gentamicin Discharge Instruction: As directed by physician Primary Dressing Maxorb Extra Ag+ Alginate Dressing, 4x4.75 (in/in) Discharge Instruction: Apply [name] wound bed as instructed Secondary Dressing Drawtex 4x4 in Discharge Instruction: Apply over primary dressing as directed. Optifoam Non-Adhesive Dressing, 4x4 in Discharge Instruction: Apply over primary dressing as directed. Secured With [employer], 4.5x3.1 (in/yd) Discharge Instruction: Secure with Kerlix as directed. Compression Wrap Compression Stockings Add-Ons Electronic Signature(s) Signed: 06/22/2023 4:32:10 PM By: Zenaida Deed RN, BSN Entered By: Zenaida Deed on 06/22/2023 10:48:16 -------------------------------------------------------------------------------- Vitals Details Patient Name: Date of Service: [name], [name] [place] E. 06/22/2023 10:45 A M Medical Record Number: [id number] Patient Account Number: [id number] Date of Birth/Sex: Treating RN: Jul 03, 1976 (47 y.o. [name] Primary Care Xyon Lukasik: Dorinda Hill Other Clinician: Referring Douglass Dunshee: Treating Amadea Keagy/Extender: Jana Hakim in Treatment: 153 Vital Signs Time Taken: 10:36 Temperature (F): 98.6 Height (in): 69 Pulse (bpm): 84 Weight (lbs): 280 Respiratory Rate (breaths/min): 18 Body Mass Index (BMI):  41.3 Blood Pressure (mmHg): 120/79 Reference Range: 80 - 120 mg / dl Electronic Signature(s) Signed: 06/22/2023 4:32:10 PM By: Zenaida Deed RN, BSN Entered By: Zenaida Deed on 06/22/2023 10:37:26

## 2023-06-23 ENCOUNTER — Other Ambulatory Visit: Payer: Self-pay

## 2023-06-23 ENCOUNTER — Ambulatory Visit (HOSPITAL_COMMUNITY)
Admission: RE | Admit: 2023-06-23 | Discharge: 2023-06-23 | Disposition: A | Discharge status: Home / Self Care | Payer: PPO | Source: Ambulatory Visit | Attending: General Surgery | Admitting: General Surgery

## 2023-06-23 ENCOUNTER — Encounter (HOSPITAL_BASED_OUTPATIENT_CLINIC_OR_DEPARTMENT_OTHER): Payer: Self-pay | Admitting: Plastic Surgery

## 2023-06-23 DIAGNOSIS — L97522 Non-pressure chronic ulcer of other part of left foot with fat layer exposed: Secondary | ICD-10-CM | POA: Insufficient documentation

## 2023-06-23 DIAGNOSIS — S91302A Unspecified open wound, left foot, initial encounter: Secondary | ICD-10-CM | POA: Diagnosis not present

## 2023-06-23 DIAGNOSIS — R600 Localized edema: Secondary | ICD-10-CM | POA: Diagnosis not present

## 2023-06-23 DIAGNOSIS — L97512 Non-pressure chronic ulcer of other part of right foot with fat layer exposed: Secondary | ICD-10-CM | POA: Insufficient documentation

## 2023-06-23 DIAGNOSIS — M799 Soft tissue disorder, unspecified: Secondary | ICD-10-CM | POA: Diagnosis not present

## 2023-06-23 DIAGNOSIS — M7989 Other specified soft tissue disorders: Secondary | ICD-10-CM | POA: Diagnosis not present

## 2023-06-23 DIAGNOSIS — S91301A Unspecified open wound, right foot, initial encounter: Secondary | ICD-10-CM | POA: Diagnosis not present

## 2023-06-23 MED ORDER — GADOBUTROL 1 MMOL/ML IV SOLN
10.0000 mL | Freq: Once | INTRAVENOUS | Status: AC | PRN
  Administered 2023-06-23: 10 mL via INTRAVENOUS

## 2023-06-25 ENCOUNTER — Other Ambulatory Visit (INDEPENDENT_AMBULATORY_CARE_PROVIDER_SITE_OTHER): Payer: Self-pay | Admitting: Family Medicine

## 2023-06-25 DIAGNOSIS — E559 Vitamin D deficiency, unspecified: Secondary | ICD-10-CM

## 2023-06-26 ENCOUNTER — Ambulatory Visit (HOSPITAL_BASED_OUTPATIENT_CLINIC_OR_DEPARTMENT_OTHER): Payer: PPO | Admitting: Anesthesiology

## 2023-06-26 NOTE — Progress Notes (Signed)
   06/23/23 1012  PAT Phone Screen  Is the patient taking a GLP-1 receptor agonist? No  Do You Have Diabetes? No  Do You Have Hypertension? Yes  Have You Ever Been to the ER for Asthma? No  Have You Taken Oral Steroids in the Past 3 Months? No  Do you Take Phenteramine or any Other Diet Drugs? No  Recent  Lab Work, EKG, CXR? Yes  Where was this test performed? EKG 02/15/23 NSR w/ incomplete rbbb  Do you have a history of heart problems? No  Have you ever had tests on your heart? Yes  What cardiac tests were performed? Echo  What date/year were cardiac tests completed? last echo ef 70-75% 10/07/19  Results viewable: [facility] Tab  Any Recent Hospitalizations? No  Height 5' 8.5" (1.74 m)  Weight (!) 137 kg  Pat Appointment Scheduled No  Reason for No Appointment Not Needed   Reviewed hx and oxygen use at night w/ Dr Salvadore FarberRip Harbour to proceed with surgery as planned 11/22 at [facility].

## 2023-06-26 NOTE — Anesthesia Preprocedure Evaluation (Addendum)
Anesthesia Evaluation  Patient identified by MRN, date of birth, ID band Patient awake    Reviewed: Allergy & Precautions, NPO status , Patient's Chart, lab work & pertinent test results  Airway Mallampati: IV  TM Distance: >3 FB Neck ROM: Full    Dental  (+) Dental Advisory Given, Teeth Intact   Pulmonary shortness of breath and with exertion, asthma , sleep apnea , pneumonia Extensive hospitalization for COVID including 2 months on ECMO.  Admitted 07/22/2019 due to COVID PNA, discharged 12/04/2019.  Has continued shortness of breath.  Pt reports that he feels he is slowly improving.   Pulmonary exam normal breath sounds clear to auscultation       Cardiovascular METS: < 3 Mets hypertension, Pt. on home beta blockers and Pt. on medications pulmonary hypertension (mod pHTN on echo 10/2019)+ Orthopnea  Normal cardiovascular exam+ Valvular Problems/Murmurs (mild MR) MR  Rhythm:Regular Rate:Normal  Echo 10/2019   1. Left ventricular ejection fraction, by estimation, is 70 to 75%. The left ventricle has hyperdynamic function. The left ventricle has no regional wall motion abnormalities. Left ventricular diastolic parameters were normal.   2. Right ventricular systolic function is normal. The right ventricular size is normal. There is moderately elevated pulmonary artery systolic pressure.   3. The mitral valve is normal in structure and function. Mild mitral valve regurgitation. No evidence of mitral stenosis.   4. The aortic valve is grossly normal. Aortic valve regurgitation is not visualized. No aortic stenosis is present.      Neuro/Psych  Headaches PSYCHIATRIC DISORDERS Anxiety Depression     Neuromuscular disease (critical illness myopathy)    GI/Hepatic Neg liver ROS,GERD  ,,  Endo/Other  diabetes  Class 3 obesity  Renal/GU      Musculoskeletal  (+) Arthritis ,    Abdominal  (+) + obese  Peds  Hematology   Anesthesia  Other Findings   Reproductive/Obstetrics                             Anesthesia Physical Anesthesia Plan  ASA: 4  Anesthesia Plan: Regional   Post-op Pain Management: Tylenol PO (pre-op)* and Gabapentin PO (pre-op)*   Induction:   PONV Risk Score and Plan: 1 and Ondansetron, Midazolam, Propofol infusion, TIVA and Treatment may vary due to age or medical condition  Airway Management Planned: Natural Airway and Simple Face Mask  Additional Equipment:   Intra-op Plan:   Post-operative Plan:   Informed Consent: I have reviewed the patients History and Physical, chart, labs and discussed the procedure including the risks, benefits and alternatives for the proposed anesthesia with the patient or authorized representative who has indicated his/her understanding and acceptance.     Dental advisory given  Plan Discussed with: CRNA  Anesthesia Plan Comments: (D/w PAT RN pt on 2LPM [place] at bedtime ever since 5 mo admission in 07/2019 for COVID (ECMO, etc). Otherwise using inhalers as instructed, no CPAP, normal echo 10/2019 towards end of COVID admission (mod pulmonary HTN, mild MR, LVEF 70%). Approved for [facility] -Finucane)        Anesthesia Quick Evaluation

## 2023-06-27 ENCOUNTER — Encounter: Payer: Self-pay | Admitting: Dietician

## 2023-06-27 ENCOUNTER — Encounter: Payer: PPO | Attending: Surgery | Admitting: Dietician

## 2023-06-27 VITALS — Ht 68.5 in | Wt 308.6 lb

## 2023-06-27 DIAGNOSIS — E669 Obesity, unspecified: Secondary | ICD-10-CM | POA: Diagnosis not present

## 2023-06-27 DIAGNOSIS — E119 Type 2 diabetes mellitus without complications: Secondary | ICD-10-CM | POA: Diagnosis not present

## 2023-06-27 NOTE — Progress Notes (Signed)
Supervised Weight Loss Visit Bariatric Nutrition Education Appt Start Time: 12:00    End Time: 12:29  Planned surgery: Sleeve Gastrectomy (pt states the surgeon is suggesting the sleeve, he states he is leaning toward the RYGB)  Pt expectation of surgery: more confidence and being healthier; do more with family  Referral stated Supervised Weight Loss (SWL) visits needed: 5  3 out of 5 SWL Appointments   NUTRITION ASSESSMENT   Anthropometrics  Start weight at [facility]: 303.9 lbs (date: 04/14/2023) Height: 68.5 in Weight today: 308.6 lbs BMI: 46.24 kg/m2     Clinical   Pharmacotherapy: History of weight loss medication used: Ozempic and Mounjaro, stating insurance started not covering.  Medical hx: asthma, obesity, sleep apnea, HTN Medications: vit D, nasacort, desyrel, miagra, protonix, singulair,  lamictal, atrovent, norco, synalar, cymbalta, symbicort, norvasc, aluterol,  Labs: vit D 21.1; HDL 33; A1c 6.1; sodium 145; albumin 4.0; RBC 6.06; platelets 132 Notable signs/symptoms: wounds from pressure area in the hospital years ago; arrived in wheel chair Any previous deficiencies? No  Lifestyle & Dietary Hx  Pt arrived with wife and son. Pt states he is having a foot surgery on Friday. Pt stated he has not been doing well with some of the changes, stating he drinks soda daily and needs to eat more non-starchy vegetables.  Estimated daily fluid intake: 64 oz Supplements: vit D, vit C, zinc Current average weekly physical activity: chair exercises; physical therapy once a week  24-Hr Dietary Recall First Meal: skip or 4 egg whites, 1-2 slices of sara lee delights bread, sf jam, [place] sausage, Fairlife fat free milk Snack: fruit or protein shake or triscuit or wheat thin or cheese its Second Meal: salad with lean meat (deli), lean cuisine steamer bowl or spring mix salad with sliced meat and cheese. Snack: fruit or protein shake or triscuit or wheat thin or cheese its Third  Meal: varies, lean meat, vegetable, rice Snack: sometimes baked cheese its Beverages: water, one regular soda a day or every other day  Estimated Energy Needs Calories: 1600  NUTRITION DIAGNOSIS  Overweight/obesity (Elliott-3.3) related to past poor dietary habits and physical inactivity as evidenced by patient w/ planned sleeve or RYGB surgery following dietary guidelines for continued weight loss.  NUTRITION INTERVENTION  Nutrition counseling (C-1) and education (E-2) to facilitate bariatric surgery goals.  Encouraged patient to honor their body's internal hunger and fullness cues.  Throughout the day, check in mentally and rate hunger. Stop eating when satisfied not full regardless of how much food is left on the plate.  Get more if still hungry 20-30 minutes later.  The key is to honor satisfaction so throughout the meal, rate fullness factor and stop when comfortably satisfied not physically full. The key is to honor hunger and fullness without any feelings of guilt or shame.  Pay attention to what the internal cues are, rather than any external factors. This will enhance the confidence you have in listening to your own body and following those internal cues enabling you to increase how often you eat when you are hungry not out of appetite and stop when you are satisfied not full.  Encouraged pt to continue to eat balanced meals inclusive of non starchy vegetables 2 times a day 7 days a week Encouraged pt to choose lean protein sources: limiting beef, pork, sausage, hotdogs, and lunch meat Encourage pt to choose healthy fats such as plant based limiting animal fats Encouraged pt to continue to drink a minium 64 fluid ounces  with half being plain water to satisfy proper hydration. Why you need complex carbohydrates: Whole grains and other complex carbohydrates are required to have a healthy diet. Whole grains provide fiber which can help with blood glucose levels and help keep you satiated. Fruits  and starchy vegetables provide essential vitamins and minerals required for immune function, eyesight support, brain support, bone density, wound healing and many other functions within the body. According to the current evidenced based 2020-2025 Dietary Guidelines for Americans, complex carbohydrates are part of a healthy eating pattern which is associated with a decreased risk for type 2 diabetes, cancers, and cardiovascular disease.    Pre-Op Goals Progress & New Goals Continue: include a protein food with every meal or snacks Continue: avoid skipping meals; eat every 3-5 hours; keep sleep routine and scheduled meals; meal prepping and meal planning. Re-engage: Eat non starchy vegetables 2 times or more a day 7 days a week. Continue: practice separating fluids from your meals and snacks; stop drinking 15 minutes before you eat and don't drink while you eat and for 30 minutes after. Continue: increase physical activity; use physical therapy exercises Re-engage/Continue: slow down at meals and snacks, chew well, aim for satisfaction before fullness, take 20-30 minutes to eat. Re-engage/Continue: avoid sugar sweetened beverages.  Handouts Provided Include  Bariatric MyPlate Goals Printed  Learning Style & Readiness for Change Teaching method utilized: Visual & Auditory  Demonstrated degree of understanding via: Teach Back  Readiness Level: contemplative Barriers to learning/adherence to lifestyle change: mobility, motivation  RD's Notes for next Visit  Patient progress toward chosen goals.   MONITORING & EVALUATION Dietary intake, weekly physical activity, body weight, and pre-op goals.  Next Steps  Patient is to return to [facility] in one month for next SWL visit.

## 2023-06-28 ENCOUNTER — Encounter (HOSPITAL_BASED_OUTPATIENT_CLINIC_OR_DEPARTMENT_OTHER): Payer: Self-pay | Admitting: Physical Therapy

## 2023-06-28 ENCOUNTER — Ambulatory Visit (HOSPITAL_BASED_OUTPATIENT_CLINIC_OR_DEPARTMENT_OTHER): Payer: PPO | Admitting: Physical Therapy

## 2023-06-28 DIAGNOSIS — M6281 Muscle weakness (generalized): Secondary | ICD-10-CM | POA: Diagnosis not present

## 2023-06-28 DIAGNOSIS — M25662 Stiffness of left knee, not elsewhere classified: Secondary | ICD-10-CM

## 2023-06-28 DIAGNOSIS — R262 Difficulty in walking, not elsewhere classified: Secondary | ICD-10-CM

## 2023-06-28 DIAGNOSIS — M25661 Stiffness of right knee, not elsewhere classified: Secondary | ICD-10-CM

## 2023-06-28 NOTE — Therapy (Signed)
OUTPATIENT PHYSICAL THERAPY TREATMENT NOTE   Patient Name: [name] MRN: [id number] DOB:21-Dec-1975, 47 y.o., male Today's Date: 06/29/2023  END OF SESSION:  PT End of Session - 06/28/23 1324     Visit Number 7    Number of Visits 8    Date for PT Re-Evaluation 07/10/23    Authorization Type HTA    PT Start Time 1320    PT Stop Time 1402    PT Time Calculation (min) 42 min    Activity Tolerance Patient tolerated treatment well    Behavior During Therapy WFL for tasks assessed/performed                  Past Medical History:  Diagnosis Date   Anginal pain (HCC)    with covid   Anxiety    Asthma    Depression    Dyspnea    GERD (gastroesophageal reflux disease)    Headache    History of kidney stones    LEFT URETERAL STONE   HTN (hypertension)    Pancreatitis 2018   GALLBALDDER SLUDGE CAUSED ISSUED RESOLVED   Pneumonia 07/2019   covid   Prediabetes    SOBOE (shortness of breath on exertion)    Past Surgical History:  Procedure Laterality Date   CANNULATION FOR ECMO (EXTRACORPOREAL MEMBRANE OXYGENATION) N/A 08/28/2019   Procedure: CANNULATION FOR VV ECMO (EXTRACORPOREAL MEMBRANE OXYGENATION);  Surgeon: Donata Clay, Theron Arista, MD;  Location: [facility] OR;  Service: Open Heart Surgery;  Laterality: N/A;  CRESCENT CANNULA   CANNULATION FOR ECMO (EXTRACORPOREAL MEMBRANE OXYGENATION) N/A 09/10/2019   Procedure: CANNULATION FOR ECMO (EXTRACORPOREAL MEMBRANE OXYGENATION) PUTTING IN CRESCENT 32FR CANNULA  AND REMOVING GROING CANNULATION;  Surgeon: Linden Dolin, MD;  Location: [facility] OR;  Service: Open Heart Surgery;  Laterality: N/A;  PUTTING IN CRESCENT/REMOVING GROIN CANNULATION   CYSTOSCOPY/URETEROSCOPY/HOLMIUM LASER/STENT PLACEMENT Left 04/18/2019   Procedure: LEFT URETEROSCOPY/HOLMIUM LASER/STENT PLACEMENT;  Surgeon: Crist Fat, MD;  Location: [facility];  Service: Urology;  Laterality: Left;   CYSTOSCOPY/URETEROSCOPY/HOLMIUM LASER/STENT  PLACEMENT Left 05/02/2019   Procedure: CYSTOSCOPY/URETEROSCOPY/HOLMIUM LASER/STENT EXCHANGE;  Surgeon: Crist Fat, MD;  Location: [facility];  Service: Urology;  Laterality: Left;   ECMO CANNULATION N/A 08/03/2019   Procedure: ECMO CANNULATION;  Surgeon: Linden Dolin, MD;  Location: [facility];  Service: Cardiovascular;  Laterality: N/A;   ESOPHAGOGASTRODUODENOSCOPY N/A 09/11/2019   Procedure: ESOPHAGOGASTRODUODENOSCOPY (EGD);  Surgeon: Linden Dolin, MD;  Location: [facility] OR;  Service: Thoracic;  Laterality: N/A;   GRAFT APPLICATION Bilateral 03/27/2020   Procedure: APPLICATION OF SKIN GRAFT BILATERAL FEET;  Surgeon: Felecia Shelling, DPM;  Location: [facility];  Service: Podiatry;  Laterality: Bilateral;   IR REPLC GASTRO/COLONIC TUBE PERCUT W/FLUORO  10/14/2019   IRRIGATION AND DEBRIDEMENT SHOULDER Right 09/29/2017   Procedure: IRRIGATION AND DEBRIDEMENT SHOULDER;  Surgeon: Tarry Kos, MD;  Location: [facility] OR;  Service: Orthopedics;  Laterality: Right;   LUMBAR DISC SURGERY  2002   NASAL ENDOSCOPY WITH EPISTAXIS CONTROL N/A 08/31/2019   Procedure: NASAL ENDOSCOPY WITH EPISTAXIS CONTROL WITH CAUTERIZATION;  Surgeon: Christia Reading, MD;  Location: [facility] OR;  Service: ENT;  Laterality: N/A;   PORTACATH PLACEMENT N/A 09/11/2019   Procedure: PEG TUBE INSERTION - BEDSIDE;  Surgeon: Linden Dolin, MD;  Location: [facility] OR;  Service: Thoracic;  Laterality: N/A;   TEE WITHOUT CARDIOVERSION N/A 08/28/2019   Procedure: TRANSESOPHAGEAL ECHOCARDIOGRAM (TEE);  Surgeon: Donata Clay, Theron Arista, MD;  Location: [facility] OR;  Service: Open  Heart Surgery;  Laterality: N/A;   TEE WITHOUT CARDIOVERSION N/A 09/10/2019   Procedure: TRANSESOPHAGEAL ECHOCARDIOGRAM (TEE);  Surgeon: Linden Dolin, MD;  Location: [facility] OR;  Service: Open Heart Surgery;  Laterality: N/A;   WOUND DEBRIDEMENT Bilateral 03/27/2020   Procedure: EXCISIONAL DEBRIDEMENT OF ULCERS BILATERAL FEET;  Surgeon: Felecia Shelling, DPM;  Location: [facility];   Service: Podiatry;  Laterality: Bilateral;   Patient Active Problem List   Diagnosis Date Noted   Type 2 diabetes mellitus without complication, without long-term current use of insulin (HCC) 10/18/2022   Vitamin D deficiency 10/18/2022   Other fatigue 09/07/2022   SOBOE (shortness of breath on exertion) 09/07/2022   Nocturnal hypoxemia 09/07/2022   Mood disorder (HCC) 09/07/2022   Ulcer of left foot (HCC) 09/07/2022   Depression 08/23/2022   Prediabetes 08/23/2022   Obstructive sleep apnea 01/11/2022   Insomnia due to other mental disorder 12/06/2021   Left foot drop 09/06/2021   Wheelchair dependence 06/04/2021   Encounter for therapeutic drug monitoring 09/29/2020   COVID-19 long hauler manifesting chronic dyspnea 09/15/2020   Morbid obesity (HCC) with starting BMI 46 09/15/2020   Encounter for attention to tracheostomy (HCC) 09/15/2020   Critical illness polyneuropathy (HCC) 09/15/2020   Moderate protein-calorie malnutrition (HCC) 09/15/2020   Pyogenic inflammation of bone (HCC) 09/09/2020   Long COVID 09/09/2020   Acute osteomyelitis (HCC) 08/31/2020   MSSA (methicillin susceptible Staphylococcus aureus) infection 08/31/2020   Reactive depression 07/01/2020   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   Impaired sensation to light touch 04/15/2020   Diffuse alveolar damage (HCC) 03/09/2020   Eschar of foot    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   History of COVID-19    Pressure injury of skin 08/22/2019   Need for emotional support    Chest tube in place    Personal history of ECMO    Advanced care planning/counseling discussion    Palliative care by specialist    Goals of care, counseling/discussion    Advanced directives, counseling/discussion    Subcutaneous crepitus    Thrombocytopenia (HCC)    Leukopenia    Fever    Gram positive bacterial infection    Septic arthritis of right acromioclavicular joint (HCC) 09/29/2017   Chronic  left-sided low back pain with left-sided sciatica 09/19/2017   Epigastric pain    Primary hypertension 04/17/2017   Moderate persistent asthma 07/21/2015   Allergic rhinoconjunctivitis 07/21/2015   GERD (gastroesophageal reflux disease) 07/21/2015   Abnormal gait 11/12/2009   TARSAL TUNNEL SYNDROME, LEFT 10/08/2009   PES PLANUS 10/08/2009     REFERRING PROVIDER: Berna Bue, MD   REFERRING DIAG: E66.01 (ICD-10-CM) - Morbid obesity due to excess calories   Rationale for Evaluation and Treatment: Rehabilitation  THERAPY DIAG:  Muscle weakness (generalized)  Stiffness of right knee, not elsewhere classified  Stiffness of left knee, not elsewhere classified  Difficulty in walking, not elsewhere classified  ONSET DATE: PT order 02/03/2023  SUBJECTIVE:  SUBJECTIVE STATEMENT: Pt states wound care MD recommended seeing a plastic surgeon concerning L foot.  He saw the plastic surgeon and is planning on having surgery on Friday.  Pt states he is not performing his HEP as he should.       PERTINENT HISTORY:  L foot ulcer--Pt primarily in W/C and not putting much weight through L LE.  Pt was instructed to stay off his foot as much as possible.  He is in a offloader shoe. Debridement of ulcers in bilat feet and skin graft in 2021.  R foot ulcer with good healing.  Pt receives wound care for L foot ulcer.  Pt states he has some dropfoot on L.   Long covid, chronic back pain, arthritis, depression, HTN, Dyspnea,  Lumbar surgery for bulging disc in 2002 R shoulder arthrotomy of AC jt, irrigation and debridement of distal clavicle, and partial excision of distal clavicle on 2019   PAIN:  Location:  back, bilat LE's, bilat feet NPRS:  3/10 current, 8/10 worst, 2/10 best   PRECAUTIONS: L foot  ulcer--avoid WB'ing of L LE, long covid, lumbar surgery in 2002    WEIGHT BEARING RESTRICTIONS: Pt states he doesn't have specific WB'ing restrictions though was instructed to stay off his foot as much as possible due to L foot ulcer.    FALLS:  Has patient fallen in last 6 months? Yes. Number of falls 2  LIVING ENVIRONMENT: Lives with: lives with their family Lives in: 1 story home Stairs: 5-6 steps with rails; has a ramp Has following equipment at home: W/C, FWW  OCCUPATION: Pt is on disability.  PLOF: Independent  PATIENT GOALS: improve muscle strength and stamina, to be able to walk again.  Pt wants to be able to perform yard work.  He wants to be able to stand to cook and take a shower.    OBJECTIVE:   DIAGNOSTIC FINDINGS:  Chest x ray on 03/02/2023: FINDINGS: Stable cardiac and mediastinal contours. Similar scarring within the left mid and lower lung and right upper lung. No pleural effusion or pneumothorax. Thoracic spine degenerative changes.   IMPRESSION: No acute cardiopulmonary process. Similar scarring.   TODAY'S TREATMENT:                                                                                                                                     Pt performed:   Seated march with Black T-band 3x10 reps   Seated clams with GTB 3x10   Seated shoulder horizontal abduction with GTB 3x10   Seated rows with GTB 3x10   Seated shoulder ER with RTB 2x10    LAQ 5# 3x10 reps   Seated HS curls with RTB 2 x10 bilat      See below for pt education.       PATIENT EDUCATION:  Education details:  PT encouraged compliance with HEP.  POC, HEP, exercise form, relevant anatomy, and  rationale of exercises.  Person educated: Patient and wife Education method: Explanation, demonstration, verbal and tactile cues Education comprehension: verbalized understanding, returned demonstration, verbal and tactile cues required  HOME EXERCISE PROGRAM: Access Code:  [email or web address] URL: [email or web address] Date: 04/17/2023 Prepared by: Aaron Edelman  Exercises - Seated March  - 1-2 x daily - 7 x weekly - 1-2 sets - 10 reps - Seated Long Arc Quad  - 1 x daily - 7 x weekly - 2 sets - 10 reps - Seated Shoulder Row with Anchored Resistance  - 1 x daily - 4-5 x weekly - 2 sets - 10 reps - Seated Bilateral Shoulder External Rotation with Resistance  - 1 x daily - 4 x weekly - 2 sets - 10 reps - Seated Shoulder Horizontal Abduction with Resistance - Palms Down  - 1 x daily - 4 x weekly - 2 sets - 10 reps   ASSESSMENT:  CLINICAL IMPRESSION: Pt performed exercises well with cuing for correct form.  He gives good effort with all exercise and had good tolerance with exercises.  Pt states he hasn't been performing his HEP as he should and PT provided encouragement to be compliant with HEP.  Pt is having surgery on his foot on Friday.  PT instructed pt he would have to get clearance from surgeon before he returns to PT.  Pt verbalized agreement.  Pt responded well to Rx having no c/o's after Rx.  He should benefit from cont skilled PT services to address ongoing goals and improve tolerance to activity and overall function.      OBJECTIVE IMPAIRMENTS: decreased activity tolerance, decreased endurance, decreased mobility, difficulty walking, decreased ROM, decreased strength, hypomobility, impaired flexibility, and pain.   ACTIVITY LIMITATIONS: carrying, standing, squatting, stairs, transfers, and locomotion level  PARTICIPATION LIMITATIONS: meal prep, cleaning, laundry, shopping, community activity, and occupation  PERSONAL FACTORS: Fitness and 3+ comorbidities: L foot ulcer, Wb'ing, long covid, lumbar surgery, chronic back pain, arthritis, depression  are also affecting patient's functional outcome.   REHAB POTENTIAL: good for goals  CLINICAL DECISION MAKING: Evolving/moderate complexity  EVALUATION COMPLEXITY: Low   GOALS:   SHORT TERM GOALS:  Target date: 05/01/2023   Pt will perform HEP without adverse effects for improved ROM and strength.  Baseline: Goal status: GOAL MET    LONG TERM GOALS: Target date: 07/10/2023  Pt will demo 0 deg of R knee extension AROM and at least 115 deg of bilat knee flexion AROM for improved stiffness and mobility and for improved gait when he is able to ambulate more.   Baseline:  Goal status: PROGRESSING  10/21  2.  Pt will be independent and compliant with HEP for improved pain, ROM, strength, and function.  Baseline:  Goal status: PROGRESSING 10/21  3.  Pt will report at least a 50% improvement in stamina/tolerance with daily activities.  Baseline:  Goal status:  60% MET  10/21    PLAN:  PT FREQUENCY:  once every other week  PT DURATION: 6 weeks  PLANNED INTERVENTIONS: Therapeutic exercises, Therapeutic activity, Neuromuscular re-education, Balance training, Gait training, Patient/Family education, Self Care, Joint mobilization, Stair training, DME instructions, Electrical stimulation, Spinal mobilization, Cryotherapy, Moist heat, Taping, Ultrasound, Manual therapy, and Re-evaluation.  PLAN FOR NEXT SESSION: Avoid WB'ing of L LE until more healing has taken place and wound care.  Cont with ther ex.  Review and perform HEP.  Knee ROM.  Pt will get clearance from surgeon before he returns to PT.   Audie Clear III  PT, DPT 06/29/23 8:40 PM

## 2023-06-30 ENCOUNTER — Encounter (HOSPITAL_BASED_OUTPATIENT_CLINIC_OR_DEPARTMENT_OTHER): Payer: Self-pay | Admitting: Plastic Surgery

## 2023-06-30 ENCOUNTER — Ambulatory Visit (HOSPITAL_BASED_OUTPATIENT_CLINIC_OR_DEPARTMENT_OTHER)
Admission: RE | Admit: 2023-06-30 | Discharge: 2023-06-30 | Disposition: A | Discharge status: Home / Self Care | Payer: PPO | Attending: Plastic Surgery | Admitting: Plastic Surgery

## 2023-06-30 ENCOUNTER — Other Ambulatory Visit: Payer: Self-pay

## 2023-06-30 ENCOUNTER — Encounter (HOSPITAL_BASED_OUTPATIENT_CLINIC_OR_DEPARTMENT_OTHER)
Admission: RE | Disposition: A | Discharge status: Home / Self Care | Payer: Self-pay | Source: Home / Self Care | Attending: Plastic Surgery

## 2023-06-30 ENCOUNTER — Ambulatory Visit (HOSPITAL_BASED_OUTPATIENT_CLINIC_OR_DEPARTMENT_OTHER): Payer: PPO | Admitting: General Surgery

## 2023-06-30 DIAGNOSIS — Z833 Family history of diabetes mellitus: Secondary | ICD-10-CM | POA: Diagnosis not present

## 2023-06-30 DIAGNOSIS — L97529 Non-pressure chronic ulcer of other part of left foot with unspecified severity: Secondary | ICD-10-CM | POA: Diagnosis not present

## 2023-06-30 DIAGNOSIS — I272 Pulmonary hypertension, unspecified: Secondary | ICD-10-CM | POA: Diagnosis not present

## 2023-06-30 DIAGNOSIS — M1990 Unspecified osteoarthritis, unspecified site: Secondary | ICD-10-CM | POA: Diagnosis not present

## 2023-06-30 DIAGNOSIS — I340 Nonrheumatic mitral (valve) insufficiency: Secondary | ICD-10-CM | POA: Insufficient documentation

## 2023-06-30 DIAGNOSIS — R7303 Prediabetes: Secondary | ICD-10-CM | POA: Diagnosis not present

## 2023-06-30 DIAGNOSIS — J4540 Moderate persistent asthma, uncomplicated: Secondary | ICD-10-CM | POA: Diagnosis not present

## 2023-06-30 DIAGNOSIS — K219 Gastro-esophageal reflux disease without esophagitis: Secondary | ICD-10-CM | POA: Diagnosis not present

## 2023-06-30 DIAGNOSIS — Z8616 Personal history of COVID-19: Secondary | ICD-10-CM | POA: Diagnosis not present

## 2023-06-30 DIAGNOSIS — Z6841 Body Mass Index (BMI) 40.0 and over, adult: Secondary | ICD-10-CM | POA: Insufficient documentation

## 2023-06-30 DIAGNOSIS — Z79899 Other long term (current) drug therapy: Secondary | ICD-10-CM | POA: Diagnosis not present

## 2023-06-30 DIAGNOSIS — G4730 Sleep apnea, unspecified: Secondary | ICD-10-CM | POA: Insufficient documentation

## 2023-06-30 DIAGNOSIS — Z539 Procedure and treatment not carried out, unspecified reason: Secondary | ICD-10-CM | POA: Insufficient documentation

## 2023-06-30 DIAGNOSIS — R0602 Shortness of breath: Secondary | ICD-10-CM | POA: Insufficient documentation

## 2023-06-30 DIAGNOSIS — I10 Essential (primary) hypertension: Secondary | ICD-10-CM | POA: Insufficient documentation

## 2023-06-30 DIAGNOSIS — E66813 Obesity, class 3: Secondary | ICD-10-CM | POA: Diagnosis not present

## 2023-06-30 DIAGNOSIS — L97519 Non-pressure chronic ulcer of other part of right foot with unspecified severity: Secondary | ICD-10-CM | POA: Insufficient documentation

## 2023-06-30 DIAGNOSIS — F32A Depression, unspecified: Secondary | ICD-10-CM | POA: Insufficient documentation

## 2023-06-30 SURGERY — DEBRIDEMENT, WOUND, WITH CLOSURE
Anesthesia: Regional | Site: Foot | Laterality: Left

## 2023-06-30 MED ORDER — MIDAZOLAM HCL 2 MG/2ML IJ SOLN
INTRAMUSCULAR | Status: AC
  Filled 2023-06-30: qty 2

## 2023-06-30 MED ORDER — CHLORHEXIDINE GLUCONATE CLOTH 2 % EX PADS: 6.0000 | MEDICATED_PAD | Freq: Once | CUTANEOUS | Status: DC

## 2023-06-30 MED ORDER — CEFAZOLIN IN SODIUM CHLORIDE 3-0.9 GM/100ML-% IV SOLN: 3.0000 g | INTRAVENOUS | Status: DC

## 2023-06-30 MED ORDER — CEFAZOLIN SODIUM-DEXTROSE 2-4 GM/100ML-% IV SOLN
INTRAVENOUS | Status: AC
  Filled 2023-06-30: qty 100

## 2023-06-30 MED ORDER — LACTATED RINGERS IV SOLN: INTRAVENOUS | Status: DC

## 2023-06-30 MED ORDER — FENTANYL CITRATE (PF) 100 MCG/2ML IJ SOLN
INTRAMUSCULAR | Status: AC
  Filled 2023-06-30: qty 2

## 2023-06-30 NOTE — Progress Notes (Signed)
Surgery cancelled today, see anesthesia note. Dr Ladona Ridgel spoke with wife and pt/family understand situation

## 2023-06-30 NOTE — Interval H&P Note (Signed)
History and Physical Interval Note:  After discussion with anesthesia, will delay surgery until pulmonology/cardiology evaluation can be done. Will reschedule in the Main OR  06/30/2023 7:25 AM  [name]  has presented today for surgery, with the diagnosis of Chronic wound.  The various methods of treatment have been discussed with the patient and family. After consideration of risks, benefits and other options for treatment, the patient has consented to  Procedure(s): left plantar surface wound preparation and myriad placement (Left) as a surgical intervention.  The patient's history has been reviewed, patient examined, no change in status, stable for surgery.  I have reviewed the patient's chart and labs.  Questions were answered to the patient's satisfaction.     [name]

## 2023-06-30 NOTE — Progress Notes (Signed)
Anesthesia Note After reviewing [name] chart I believe that he should be evaluated for his pulmonary HTN prior to elective surgery. He claims he saw a pulmonologist in the past, but I cannot find a record of this. He cannot lie flat without being SOB, and he also has significant DOE. I believe he is not a candidate for [facility], but I explained that I would be willing to do his case today at [facility] with limited sedation and regional/local. He was nervous about doing the procedure without being fully asleep. I explained that I felt GA/deep sedation was not safe in his case today. Dr. Ladona Ridgel arrived in the room at this time and we had a discussion about his medical management. Dr. Ladona Ridgel we felt the best course of action was to delay surgery for pulmonary evaluation, and to have him done at the main OR.

## 2023-07-04 ENCOUNTER — Telehealth: Payer: Self-pay | Admitting: Pulmonary Disease

## 2023-07-04 ENCOUNTER — Telehealth: Payer: Self-pay | Admitting: *Deleted

## 2023-07-04 NOTE — Telephone Encounter (Signed)
   Pre-operative Risk Assessment    Patient Name: [name]  DOB: Feb 27, 1976 MRN: [id number]   Last OV: None Upcoming OV: Dr. Elease Hashimoto 07/10/2023 Pre op already added to notes.   Request for Surgical Clearance    Procedure:   Left Plantar Surface wound preparation and myriad placement.   Date of Surgery:  Clearance TBD                                 Surgeon:  Dr. Weyman Croon Surgeon's Group or Practice Name:  [facility] Phone number:  [phone] Fax number:  [phone]   Type of Clearance Requested:   - Medical    Type of Anesthesia:  General    Additional requests/questions:    Signed, Emmit [name]   07/04/2023, 9:30 AM

## 2023-07-04 NOTE — Telephone Encounter (Signed)
Fax received from Dr. Weyman Croon with [facility] to perform a left plantar surface wound prep and myriad placement on patient. Length of surgery 1 hour 15 min under general anesthesia. Patient needs surgery clearance. Surgery is pending. Patient was seen on 10/28/20. Office protocol is a risk assessment can be sent to surgeon if patient has been seen in 60 days or less.   Sending to Dr Everardo All- pt has ov 07/05/23 for surgery clearance

## 2023-07-04 NOTE — Telephone Encounter (Signed)
Will risk stratify patient when he arrives.

## 2023-07-05 ENCOUNTER — Ambulatory Visit (HOSPITAL_BASED_OUTPATIENT_CLINIC_OR_DEPARTMENT_OTHER): Payer: PPO | Admitting: Pulmonary Disease

## 2023-07-05 ENCOUNTER — Encounter (HOSPITAL_BASED_OUTPATIENT_CLINIC_OR_DEPARTMENT_OTHER): Payer: Self-pay | Admitting: Pulmonary Disease

## 2023-07-05 VITALS — BP 118/86 | HR 73 | Resp 16 | Ht 68.5 in | Wt 309.0 lb

## 2023-07-05 DIAGNOSIS — I2729 Other secondary pulmonary hypertension: Secondary | ICD-10-CM | POA: Diagnosis not present

## 2023-07-05 DIAGNOSIS — J4530 Mild persistent asthma, uncomplicated: Secondary | ICD-10-CM

## 2023-07-05 DIAGNOSIS — Z8616 Personal history of COVID-19: Secondary | ICD-10-CM | POA: Diagnosis not present

## 2023-07-05 DIAGNOSIS — Z01811 Encounter for preprocedural respiratory examination: Secondary | ICD-10-CM | POA: Diagnosis not present

## 2023-07-05 NOTE — Patient Instructions (Addendum)
Mild persistent asthma - mildly symptomatic with inconsistent inhaler use Post-ARDS 2/2 covid --RESTART Breztri TWO puffs in the morning and evening --CONTINUE Albuterol TWO puffs AS NEEDED for shortness of breath or wheezing  Suspected pulmonary hypertension likely related to chronic lung disease 10/01/20 Split night no OSA or CSA. Nocturnal hypoxemia requiring 2L nightly --Repeat echocardiogram to confirm elevated PASP --ORDER pulmonary function test  Peri-operative Assessment of Pulmonary Risk for Non-Thoracic Surgery:  ARISCAT score: LOW RISK 1.6% risk of in-hospital post-op pulmonary complications (composite including respiratory failure, respiratory infection, pleural effusion, atelectasis, pneumothorax, bronchospasm, aspiration pneumonitis)  For Mr. Piontek, risk of perioperative pulmonary complications is increased by:  [ ]  Age greater than 65 years  [ ]  COPD  [ ]  Serum albumin <3.5  [ ]  Smoking  [ ]  Obstructive sleep apnea  [ ]  NYHA Class II Pulmonary Hypertension  Respiratory complications generally occur in 1% of ASA Class I patients, 5% of ASA Class II and 10% of ASA Class III-IV patients These complications rarely result in mortality and include postoperative pneumonia, atelectasis, pulmonary embolism, ARDS and increased time requiring postoperative mechanical ventilation.  Overall, I recommend proceeding with the surgery if the risk for respiratory complications are outweighed by the potential benefits. This will need to be discussed between the patient and surgeon.  To reduce risks of respiratory complications, I recommend: --Pre- and post-operative incentive spirometry performed frequently while awake --Avoiding use of pancuronium during anesthesia.  I have discussed the risk factors and recommendations above with the patient.

## 2023-07-05 NOTE — Progress Notes (Signed)
Patient ID: [name], male    DOB: 1976-05-16, 47 y.o.   MRN: [id number]  Chief Complaint  Patient presents with   Pre-op Exam    Surgical clearance. Having foot surgery not yet scheduled.     Referring provider: Melida Quitter, MD  HPI:  [name] is a 47 y.o. man with history of asthma and extensive ~4 month hospitalization with COVID19 ARDS including 2 months on VV ECMO with tracheostomy s/p decannulation whom we are seeing in consultation at the request of Wile, Nyoka Cowden, MD for evaluation of respiratory failure.  Prior notes: Patient returns to clinic after several months.  Overall he is doing relatively well.  Breathing seem to slowly be improving.  No longer using daytime oxygen.  Recent sleep study with nocturnal hypoxemia prescribed 2 L nasal cannula.  No need for CPAP or BiPAP.  He continued to have ongoing issues with bilateral foot wounds.  Wearing orthopedic boot today.  Cannot exercise, improve his stamina as he is has to spend most if not all time nonweightbearing.  Endorses 20 pound weight gain.  Frustrated by this.  Want to get back to work.  Discussed my goal to get back to work to, PFTs reviewed with moderate changes.  Frankly results better than anticipated.  HPI initial visit: Multiple notes reviewed in EMR pertaining to prolonged hospitalization. Further details documented there.   His chief complaint is shortness of breath. Present since discharge from hospital late April 2021. Today he reports slow but he thinks gradual improvement over time. Still winded with relatively minimal activity especially inclines/stairs. Severity described as moderate. But during conversation he reports working in yard around house at a slow pace and his wife mentions going for a walk. So his exertional capacity is improving but slowly. 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 He endorses some cough in evening. Not  productive then but does produce a small (quarter or so size) amount of dark yellow to brown sputum. No cough during the day. No chest pain with exertion. Feels symptoms improve with once daily Symbicort. Uses albuterol in the evenings multiple times a week. Unsure if it helps, doesn't see much benefit. Thinks it may be habit. Wife agrees. Mild to minimal LE edema managed with lasix.   Has critical illness myopathy. Starting outpatient PT soon per report. He is being treated for foot ulcers as a result of prolonged immobilization in ICU. Planning what sounds like surgical debridement in the future  07/05/23 Patient was last seen in 2022 with Dr. Judeth Horn. He reports he is not consistent with his Markus Daft and is followed by Dr. Dellis Anes with [facility]. He has some cough and shortness of breath off the inhaler but improved whenever he is compliant. Previously on Trelegy and did well with this but insurance did not cover. Dr. Weyman Croon with [facility] to perform a left plantar surface wound prep and myriad placement on patient. Length of surgery 1 hour 15 min under general anesthesia.   PMH: asthma, obesity Surgical History: ECMO cannulation, shoulder debridement 2019 Family History: asthma in brother, DM and HTN in father Social History: Lives in [place] with wife, son, worked in [employer] prior to [employer], never smoker   Public house manager / Pulmonary Flowsheets:   ACT:  Asthma Control Test ACT Total Score  01/26/2023  1:00 PM 14  12/15/2020  9:00 AM 17  10/28/2020  3:36 PM 17    Imaging: Personally reviewed with interpretation as follows: Most recent CXR 10/27/2019: Low lung volumes, bilateral diffuse infiltrates most dense in center of chest Most recent CT Chest 06/12/21: Resolved airspace disease. Scarring with bronchiectasis and architectural distortion bilatearlly  Lab Results: Personally Reviewed, Eos 100 CBC    Component Value Date/Time   WBC 6.6  09/21/2022 1243   WBC 7.7 08/31/2020 0000   RBC 6.06 (H) 09/21/2022 1243   RBC 5.66 08/31/2020 0000   HGB 16.0 09/21/2022 1243   HCT 51.0 09/21/2022 1243   PLT 132 (L) 09/21/2022 1243   MCV 84 09/21/2022 1243   MCH 26.4 (L) 09/21/2022 1243   MCH 28.3 08/31/2020 0000   MCHC 31.4 (L) 09/21/2022 1243   MCHC 33.8 08/31/2020 0000   RDW 12.7 09/21/2022 1243   LYMPHSABS 2.0 09/21/2022 1243   MONOABS 0.7 12/02/2019 0628   EOSABS 0.1 09/21/2022 1243   BASOSABS 0.1 09/21/2022 1243    BMET    Component Value Date/Time   NA 145 (H) 09/21/2022 1243   K 4.3 09/21/2022 1243   CL 102 09/21/2022 1243   CO2 22 09/21/2022 1243   GLUCOSE 99 09/21/2022 1243   GLUCOSE 129 (H) 08/31/2020 0000   BUN 17 09/21/2022 1243   CREATININE 0.94 09/21/2022 1243   CREATININE 1.10 08/31/2020 0000   CALCIUM 9.1 09/21/2022 1243   GFRNONAA 80 08/31/2020 0000   GFRAA 93 08/31/2020 0000    BNP    Component Value Date/Time   BNP 15.3 07/22/2019 1039    ProBNP No results found for: "PROBNP"  Specialty Problems       Pulmonary Problems   Allergic rhinoconjunctivitis   Moderate persistent asthma   Diffuse alveolar damage (HCC)   COVID-19 long hauler manifesting chronic dyspnea   Encounter for attention to tracheostomy (HCC)   Obstructive sleep apnea   Nocturnal hypoxemia   SOBOE (shortness of breath on exertion)    No Known Allergies  Immunization History  Administered Date(s) Administered   Influenza, Quadrivalent, Recombinant, Inj, Pf 05/13/2020   PFIZER(Purple Top)SARS-COV-2 Vaccination 02/18/2020, 03/10/2020    Past Medical History:  Diagnosis Date   Anginal pain (HCC)    with covid   Anxiety    Asthma    Depression    Dyspnea    GERD (gastroesophageal reflux disease)    Headache    History of kidney stones    LEFT URETERAL STONE   HTN (hypertension)    Pancreatitis 2018   GALLBALDDER SLUDGE CAUSED ISSUED RESOLVED   Pneumonia 07/2019   covid   Prediabetes    SOBOE  (shortness of breath on exertion)     Tobacco History: Social History   Tobacco Use  Smoking Status Never  Smokeless Tobacco Never   Counseling given: Not Answered    Outpatient Encounter Medications as of 07/05/2023  Medication Sig   albuterol (VENTOLIN HFA) 108 (90 Base) MCG/ACT inhaler TAKE 2 PUFFS BY MOUTH EVERY 4 HOURS AS NEEDED   amLODipine (NORVASC) 5 MG tablet Take 5 mg by mouth daily.   Budeson-Glycopyrrol-Formoterol (BREZTRI AEROSPHERE) 160-9-4.8 MCG/ACT AERO Inhale 2 puffs into the lungs in the morning and at bedtime.   DULoxetine (CYMBALTA) 60 MG capsule 1 capsule Orally Once a day for 90   fluocinolone (SYNALAR) 0.01 % external solution Apply topically 2 (two) times daily as needed.   HYDROcodone-acetaminophen (NORCO) 5-325 MG tablet Take 1 tablet by mouth 2 (two) times daily as needed for moderate pain (pain score  4-6).   ipratropium (ATROVENT) 0.06 % nasal spray Place 2 sprays into both nostrils 3 (three) times daily.   lamoTRIgine (LAMICTAL) 100 MG tablet Take 1 tablet (100 mg total) by mouth daily.   metoprolol tartrate (LOPRESSOR) 25 MG tablet Take 25 mg by mouth 2 (two) times daily.   montelukast (SINGULAIR) 10 MG tablet Take 1 tablet (10 mg total) by mouth daily.   OXYGEN Inhale 2 L into the lungs at bedtime.   pantoprazole (PROTONIX) 40 MG tablet Take 1 tablet by mouth daily.   sildenafil (VIAGRA) 50 MG tablet 1 tablet as needed Orally Once a day   traZODone (DESYREL) 100 MG tablet Take 2 tablets (200 mg total) by mouth at bedtime.   triamcinolone (NASACORT) 55 MCG/ACT AERO nasal inhaler Place 2 sprays into the nose daily.   Vitamin D, Ergocalciferol, (DRISDOL) 1.25 MG (50000 UNIT) CAPS capsule TAKE 1 CAPSULE (50,000 UNITS TOTAL) BY MOUTH EVERY 7 (SEVEN) DAYS   [DISCONTINUED] ondansetron (ZOFRAN) 4 MG tablet Take 1 tablet (4 mg total) by mouth every 8 (eight) hours as needed for nausea or vomiting.   No facility-administered encounter medications on file as of  07/05/2023.     Review of Systems  Review of Systems  Constitutional:  Negative for chills, diaphoresis and fever.  HENT:  Negative for congestion.   Respiratory:  Positive for cough and shortness of breath. Negative for wheezing.   Cardiovascular:  Negative for chest pain, palpitations and leg swelling.    N/a  Physical Exam  BP 118/86   Pulse 73   Resp 16   Ht 5' 8.5" (1.74 m)   Wt (!) 309 lb (140.2 kg)   SpO2 95%   BMI 46.30 kg/m   Wt Readings from Last 5 Encounters:  07/05/23 (!) 309 lb (140.2 kg)  06/30/23 (!) 307 lb 8.7 oz (139.5 kg)  06/27/23 (!) 308 lb 9.6 oz (140 kg)  05/30/23 (!) 303 lb 8 oz (137.7 kg)  05/08/23 (!) 303 lb 12.8 oz (137.8 kg)    BMI Readings from Last 5 Encounters:  07/05/23 46.30 kg/m  06/30/23 46.08 kg/m  06/27/23 46.24 kg/m  05/30/23 45.48 kg/m  05/08/23 45.52 kg/m     Physical Exam Physical Exam: General: Well-appearing, no acute distress HENT: [place], AT Eyes: EOMI, no scleral icterus Respiratory: Clear to auscultation bilaterally.  No crackles, wheezing or rales Cardiovascular: RRR, -M/R/G, no JVD Extremities:-Edema,-tenderness Neuro: AAO x4, CNII-XII grossly intact Psych: Normal mood, normal affect   Assessment & Plan:   47 year old male with hx asthma and hx ARDS secondary to covid requiring VV ECMO, s/p tracheostomy s/p decannulation who presents for follow-up. Last seen in 2022. Mild asthma symptoms off bronchodilators. Presents today for optimizing lung issues and plan for surgery.  Discussed clinical course and management of asthma including bronchodilator regimen, preventive care including vaccinations and action plan for exacerbation.   Mild persistent asthma - mildly symptomatic with inconsistent inhaler use Post-ARDS 2/2 covid --RESTART Breztri TWO puffs in the morning and evening --CONTINUE Albuterol TWO puffs AS NEEDED for shortness of breath or wheezing  Suspected pulmonary hypertension likely related to  chronic lung disease 10/01/20 Split night no OSA or CSA. Nocturnal hypoxemia requiring 2L nightly --Repeat echocardiogram to confirm elevated PASP --ORDER pulmonary function test --Consider ONO in the future  Peri-operative Assessment of Pulmonary Risk for Non-Thoracic Surgery:  ARISCAT score: LOW RISK 1.6% risk of in-hospital post-op pulmonary complications (composite including respiratory failure, respiratory infection, pleural effusion, atelectasis, pneumothorax,  bronchospasm, aspiration pneumonitis)  For Mr. Santoli, risk of perioperative pulmonary complications is increased by:  [ ]  Age greater than 65 years  [ ]  COPD  [ ]  Serum albumin <3.5  [ ]  Smoking  [ ]  Obstructive sleep apnea  [ ]  NYHA Class II Pulmonary Hypertension  Respiratory complications generally occur in 1% of ASA Class I patients, 5% of ASA Class II and 10% of ASA Class III-IV patients These complications rarely result in mortality and include postoperative pneumonia, atelectasis, pulmonary embolism, ARDS and increased time requiring postoperative mechanical ventilation.  Overall, I recommend proceeding with the surgery if the risk for respiratory complications are outweighed by the potential benefits. This will need to be discussed between the patient and surgeon.  To reduce risks of respiratory complications, I recommend: --Pre- and post-operative incentive spirometry performed frequently while awake --Avoiding use of pancuronium during anesthesia.  I have discussed the risk factors and recommendations above with the patient.   Return for after PFT when next available with me.  I have spent a total time of 40-minutes on the day of the appointment including chart review, data review, collecting history, coordinating care and discussing medical diagnosis and plan with the patient/family. Past medical history, allergies, medications were reviewed. Pertinent imaging, labs and tests included in this note have been  reviewed and interpreted independently by me.  Eulon Allnutt Mechele Collin, MD 07/05/2023

## 2023-07-09 NOTE — Progress Notes (Unsigned)
  Cardiology Office Note:  .   Date:  07/09/2023  ID:  [name], DOB [birth year], MRN [id number] PCP: Melida Quitter, MD  [facility] Prov[facility] Cardiologist:  None { Click to update primary MD,subspecialty MD or APP then REFRESH:1}   History of Present Illness: .   [name] is a 47 y.o. male with hx of asthma S/p 4 month hospitalization with COVID 19 ARDSwith VV ECMO, tracheostomy,  We are seeing him for pre-procedure evaluation prior to left plantar surface wound prep.  Echo has been ordered by Dr. Everardo All of pulmonary   Echo from March, 2021 showed hyperdynamic LV systolic function with EF 70-75%. Moderate elevation of pulmonary pressures Mild MR       ROS: ***  Studies Reviewed: .        *** Risk Assessment/Calculations:   {Does this patient have ATRIAL FIBRILLATION?:[phone]} No BP recorded.  {Refresh Note OR Click here to enter BP  :1}***       Physical Exam:   VS:  There were no vitals taken for this visit.   Wt Readings from Last 3 Encounters:  07/05/23 (!) 309 lb (140.2 kg)  06/30/23 (!) 307 lb 8.7 oz (139.5 kg)  06/27/23 (!) 308 lb 9.6 oz (140 kg)    GEN: Well nourished, well developed in no acute distress NECK: No JVD; No carotid bruits CARDIAC: ***RRR, no murmurs, rubs, gallops RESPIRATORY:  Clear to auscultation without rales, wheezing or rhonchi  ABDOMEN: Soft, non-tender, non-distended EXTREMITIES:  No edema; No deformity   ASSESSMENT AND PLAN: .   ***    {Are you ordering a CV Procedure (e.g. stress test, cath, DCCV, TEE, etc)?   Press F2        :[id number]}  Dispo: ***  Signed, Kristeen Miss, MD

## 2023-07-10 ENCOUNTER — Encounter: Payer: PPO | Admitting: Plastic Surgery

## 2023-07-10 ENCOUNTER — Ambulatory Visit: Payer: PPO | Admitting: Cardiovascular Disease

## 2023-07-12 ENCOUNTER — Ambulatory Visit (HOSPITAL_BASED_OUTPATIENT_CLINIC_OR_DEPARTMENT_OTHER): Payer: PPO | Attending: Surgery | Admitting: Physical Therapy

## 2023-07-12 DIAGNOSIS — M6281 Muscle weakness (generalized): Secondary | ICD-10-CM | POA: Diagnosis not present

## 2023-07-12 DIAGNOSIS — M25662 Stiffness of left knee, not elsewhere classified: Secondary | ICD-10-CM | POA: Insufficient documentation

## 2023-07-12 DIAGNOSIS — R262 Difficulty in walking, not elsewhere classified: Secondary | ICD-10-CM | POA: Insufficient documentation

## 2023-07-12 DIAGNOSIS — M25661 Stiffness of right knee, not elsewhere classified: Secondary | ICD-10-CM | POA: Insufficient documentation

## 2023-07-12 NOTE — Therapy (Signed)
OUTPATIENT PHYSICAL THERAPY TREATMENT NOTE / DISCHARGE SUMMARY   Patient Name: [name] MRN: [id number] DOB:24-Feb-1976, 47 y.o., male Today's Date: 07/13/2023  END OF SESSION:  PT End of Session - 07/12/23 1324     Visit Number 8    Number of Visits 8    Authorization Type HTA    PT Start Time 1320    PT Stop Time 1409    PT Time Calculation (min) 49 min    Activity Tolerance Patient tolerated treatment well    Behavior During Therapy WFL for tasks assessed/performed                  Past Medical History:  Diagnosis Date   Anginal pain (HCC)    with covid   Anxiety    Asthma    Depression    Dyspnea    GERD (gastroesophageal reflux disease)    Headache    History of kidney stones    LEFT URETERAL STONE   HTN (hypertension)    Pancreatitis 2018   GALLBALDDER SLUDGE CAUSED ISSUED RESOLVED   Pneumonia 07/2019   covid   Prediabetes    SOBOE (shortness of breath on exertion)    Past Surgical History:  Procedure Laterality Date   CANNULATION FOR ECMO (EXTRACORPOREAL MEMBRANE OXYGENATION) N/A 08/28/2019   Procedure: CANNULATION FOR VV ECMO (EXTRACORPOREAL MEMBRANE OXYGENATION);  Surgeon: Donata Clay, Theron Arista, MD;  Location: [facility] OR;  Service: Open Heart Surgery;  Laterality: N/A;  CRESCENT CANNULA   CANNULATION FOR ECMO (EXTRACORPOREAL MEMBRANE OXYGENATION) N/A 09/10/2019   Procedure: CANNULATION FOR ECMO (EXTRACORPOREAL MEMBRANE OXYGENATION) PUTTING IN CRESCENT 32FR CANNULA  AND REMOVING GROING CANNULATION;  Surgeon: Linden Dolin, MD;  Location: [facility] OR;  Service: Open Heart Surgery;  Laterality: N/A;  PUTTING IN CRESCENT/REMOVING GROIN CANNULATION   CYSTOSCOPY/URETEROSCOPY/HOLMIUM LASER/STENT PLACEMENT Left 04/18/2019   Procedure: LEFT URETEROSCOPY/HOLMIUM LASER/STENT PLACEMENT;  Surgeon: Crist Fat, MD;  Location: [facility];  Service: Urology;  Laterality: Left;   CYSTOSCOPY/URETEROSCOPY/HOLMIUM LASER/STENT PLACEMENT Left 05/02/2019    Procedure: CYSTOSCOPY/URETEROSCOPY/HOLMIUM LASER/STENT EXCHANGE;  Surgeon: Crist Fat, MD;  Location: [facility] ORS;  Service: Urology;  Laterality: Left;   ECMO CANNULATION N/A 08/03/2019   Procedure: ECMO CANNULATION;  Surgeon: Linden Dolin, MD;  Location: [facility];  Service: Cardiovascular;  Laterality: N/A;   ESOPHAGOGASTRODUODENOSCOPY N/A 09/11/2019   Procedure: ESOPHAGOGASTRODUODENOSCOPY (EGD);  Surgeon: Linden Dolin, MD;  Location[facility] OR;  Service: Thoracic;  Laterality: N/A;   GRAFT APPLICATION Bilateral 03/27/2020   Procedure: APPLICATION OF SKIN GRAFT BILATERAL FEET;  Surgeon: Felecia Shelling, DPM;  Location: [facility];  Service: Podiatry;  Laterality: Bilateral;   IR REPLC GASTRO/COLONIC TUBE PERCUT W/FLUORO  10/14/2019   IRRIGATION AND DEBRIDEMENT SHOULDER Right 09/29/2017   Procedure: IRRIGATION AND DEBRIDEMENT SHOULDER;  Surgeon: Tarry Kos, MD;  Location: [facility] OR;  Service: Orthopedics;  Laterality: Right;   LUMBAR DISC SURGERY  2002   NASAL ENDOSCOPY WITH EPISTAXIS CONTROL N/A 08/31/2019   Procedure: NASAL ENDOSCOPY WITH EPISTAXIS CONTROL WITH CAUTERIZATION;  Surgeon: Christia Reading, MD;  Location: [facility] OR;  Service: ENT;  Laterality: N/A;   PORTACATH PLACEMENT N/A 09/11/2019   Procedure: PEG TUBE INSERTION - BEDSIDE;  Surgeon: Linden Dolin, MD;  Location: [facility] OR;  Service: Thoracic;  Laterality: N/A;   TEE WITHOUT CARDIOVERSION N/A 08/28/2019   Procedure: TRANSESOPHAGEAL ECHOCARDIOGRAM (TEE);  Surgeon: Donata Clay, Theron Arista, MD;  Location: [facility] OR;  Service: Open Heart Surgery;  Laterality: N/A;  TEE WITHOUT CARDIOVERSION N/A 09/10/2019   Procedure: TRANSESOPHAGEAL ECHOCARDIOGRAM (TEE);  Surgeon: Linden Dolin, MD;  Location: [facility] OR;  Service: Open Heart Surgery;  Laterality: N/A;   WOUND DEBRIDEMENT Bilateral 03/27/2020   Procedure: EXCISIONAL DEBRIDEMENT OF ULCERS BILATERAL FEET;  Surgeon: Felecia Shelling, DPM;  Location: [facility];  Service: Podiatry;   Laterality: Bilateral;   Patient Active Problem List   Diagnosis Date Noted   Type 2 diabetes mellitus without complication, without long-term current use of insulin (HCC) 10/18/2022   Vitamin D deficiency 10/18/2022   Other fatigue 09/07/2022   SOBOE (shortness of breath on exertion) 09/07/2022   Nocturnal hypoxemia 09/07/2022   Mood disorder (HCC) 09/07/2022   Ulcer of left foot (HCC) 09/07/2022   Depression 08/23/2022   Prediabetes 08/23/2022   Obstructive sleep apnea 01/11/2022   Insomnia due to other mental disorder 12/06/2021   Left foot drop 09/06/2021   Wheelchair dependence 06/04/2021   Encounter for therapeutic drug monitoring 09/29/2020   COVID-19 long hauler manifesting chronic dyspnea 09/15/2020   Morbid obesity (HCC) with starting BMI 46 09/15/2020   Encounter for attention to tracheostomy (HCC) 09/15/2020   Critical illness polyneuropathy (HCC) 09/15/2020   Moderate protein-calorie malnutrition (HCC) 09/15/2020   Pyogenic inflammation of bone (HCC) 09/09/2020   Long COVID 09/09/2020   Acute osteomyelitis (HCC) 08/31/2020   MSSA (methicillin susceptible Staphylococcus aureus) infection 08/31/2020   Reactive depression 07/01/2020   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   Impaired sensation to light touch 04/15/2020   Diffuse alveolar damage (HCC) 03/09/2020   Eschar of foot    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   History of COVID-19    Pressure injury of skin 08/22/2019   Need for emotional support    Chest tube in place    Personal history of ECMO    Advanced care planning/counseling discussion    Palliative care by specialist    Goals of care, counseling/discussion    Advanced directives, counseling/discussion    Subcutaneous crepitus    Thrombocytopenia (HCC)    Leukopenia    Fever    Gram positive bacterial infection    Septic arthritis of right acromioclavicular joint (HCC) 09/29/2017   Chronic left-sided low back  pain with left-sided sciatica 09/19/2017   Epigastric pain    Primary hypertension 04/17/2017   Moderate persistent asthma 07/21/2015   Allergic rhinoconjunctivitis 07/21/2015   GERD (gastroesophageal reflux disease) 07/21/2015   Abnormal gait 11/12/2009   TARSAL TUNNEL SYNDROME, LEFT 10/08/2009   PES PLANUS 10/08/2009     REFERRING PROVIDER: Berna Bue, MD   REFERRING DIAG: E66.01 (ICD-10-CM) - Morbid obesity due to excess calories   Rationale for Evaluation and Treatment: Rehabilitation  THERAPY DIAG:  Muscle weakness (generalized)  Stiffness of right knee, not elsewhere classified  Stiffness of left knee, not elsewhere classified  Difficulty in walking, not elsewhere classified  ONSET DATE: PT order 02/03/2023  SUBJECTIVE:  SUBJECTIVE STATEMENT: Pt hasn't had surgery yet.  He was cleared by pulmonology and is waiting to see cardiologist to be cleared.   Pt reports at least 50% improvement in stamina/tolerance with daily activities.  Pt states he is not doing his exercises as much as he should.  He reports he is performing his HEP at least 2 times per week.  Pt reports improved showering.  Pt reports it's easier to move in W/C.  Pt's L foot limits his mobility.      PERTINENT HISTORY:  L foot ulcer--Pt primarily in W/C and not putting much weight through L LE.  Pt was instructed to stay off his foot as much as possible.  He is in a offloader shoe. Debridement of ulcers in bilat feet and skin graft in 2021.  R foot ulcer with good healing.  Pt receives wound care for L foot ulcer.  Pt states he has some dropfoot on L.   Long covid, chronic back pain, arthritis, depression, HTN, Dyspnea,  Lumbar surgery for bulging disc in 2002 R shoulder arthrotomy of AC jt, irrigation and  debridement of distal clavicle, and partial excision of distal clavicle on 2019   PAIN:  Location:  back, bilat LE's, bilat feet NPRS:  3/10 current, 8/10 worst, 2/10 best   PRECAUTIONS: L foot ulcer--avoid WB'ing of L LE, long covid, lumbar surgery in 2002    WEIGHT BEARING RESTRICTIONS: Pt states he doesn't have specific WB'ing restrictions though was instructed to stay off his foot as much as possible due to L foot ulcer.    FALLS:  Has patient fallen in last 6 months? Yes. Number of falls 2  LIVING ENVIRONMENT: Lives with: lives with their family Lives in: 1 story home Stairs: 5-6 steps with rails; has a ramp Has following equipment at home: W/C, FWW  OCCUPATION: Pt is on disability.  PLOF: Independent  PATIENT GOALS: improve muscle strength and stamina, to be able to walk again.  Pt wants to be able to perform yard work.  He wants to be able to stand to cook and take a shower.    OBJECTIVE:   DIAGNOSTIC FINDINGS:  Chest x ray on 03/02/2023: FINDINGS: Stable cardiac and mediastinal contours. Similar scarring within the left mid and lower lung and right upper lung. No pleural effusion or pneumothorax. Thoracic spine degenerative changes.   IMPRESSION: No acute cardiopulmonary process. Similar scarring.   TODAY'S TREATMENT:                                                                                                                                  MMT:  Hip flexion:  5/5 bilat  Knee extension:  5/5 bilat   LOWER EXTREMITY ROM:      AROM Right eval Left eval Right 10/21 Left 10/21 Right 12/4 Left 12/4  Hip flexion            Hip extension  Hip abduction            Hip adduction            Hip internal rotation            Hip external rotation            Knee flexion 111 108 114 112 Pt had cramping when trying to assess 112  Knee extension 3/0 AROM/PROM 0 2 0 2 0  Ankle dorsiflexion 9 limited 8 -13 deg 9 limited  Ankle plantarflexion 46           Ankle inversion            Ankle eversion             (Blank rows = not tested)  LEFS: INITIAL/PRIOR/CURRENT: 8/80 / 25/80 / 33/80   Pt performed:   Seated shoulder ER with RTB 2x10    LAQ 5# 3x10 reps   Seated HS curls with RTB 2 x10 bilat      See below for pt education.      Reviewed and updated HEP.  Pt received a HEP handout and was educated in correct form and appropriate frequency.        PATIENT EDUCATION:  Education details:  PT encouraged compliance with HEP.  POC/discharge planning, objective findings, HEP, exercise form, relevant anatomy, and rationale of exercises.  Person educated: Patient and wife Education method: Explanation, demonstration, verbal and tactile cues Education comprehension: verbalized understanding, returned demonstration, verbal and tactile cues required  HOME EXERCISE PROGRAM: Access Code: [email or web address] URL: [email or web address] Date: 04/17/2023 Prepared by: Aaron Edelman  Exercises - Seated March  - 1-2 x daily - 7 x weekly - 1-2 sets - 10 reps - Seated Long Arc Quad  - 1 x daily - 7 x weekly - 2 sets - 10 reps - Seated Shoulder Row with Anchored Resistance  - 1 x daily - 4-5 x weekly - 2 sets - 10 reps - Seated Bilateral Shoulder External Rotation with Resistance  - 1 x daily - 4 x weekly - 2 sets - 10 reps - Seated Shoulder Horizontal Abduction with Resistance - Palms Down  - 1 x daily - 4 x weekly - 2 sets - 10 reps  Updated HEP: - Seated Hip Abduction with Resistance  - 1 x daily - 3-4 x weekly - 2 sets - 10 reps   ASSESSMENT:  CLINICAL IMPRESSION: Pt has progressed well with function as evidenced by subjective reports.  Pt reports at least 50% improvement in stamina/tolerance with daily activities. Pt reports improved showering and easier mobility in W/C.  He continues to be limited with mobility and ambulation due to L foot ulcer.  He has good knee extension ROM on L though still has some limitations on R.  Pt is  very limited with L foot DF AROM.  Pt has good strength in bilat hip flex and knee extension.  Pt hasn't been consistent with HEP and PT encouraged compliance with HEP.  PT reviewed HEP and educated pt with correct exercises and appropriate frequency.  Pt demonstrates improved self perceived disability as evidenced by increased LEFS score.  Pt met STG #1 and LTG #3 and partially met LTG #2.  Pt is independent with HEP and agrees with discharge.  He responded well to Rx having no c/o's after Rx.       OBJECTIVE IMPAIRMENTS: decreased activity tolerance, decreased endurance, decreased mobility, difficulty walking, decreased ROM, decreased strength, hypomobility, impaired flexibility,  and pain.   ACTIVITY LIMITATIONS: carrying, standing, squatting, stairs, transfers, and locomotion level  PARTICIPATION LIMITATIONS: meal prep, cleaning, laundry, shopping, community activity, and occupation  PERSONAL FACTORS: Fitness and 3+ comorbidities: L foot ulcer, Wb'ing, long covid, lumbar surgery, chronic back pain, arthritis, depression  are also affecting patient's functional outcome.   REHAB POTENTIAL: good for goals  CLINICAL DECISION MAKING: Evolving/moderate complexity  EVALUATION COMPLEXITY: Low   GOALS:   SHORT TERM GOALS: Target date: 05/01/2023   Pt will perform HEP without adverse effects for improved ROM and strength.  Baseline: Goal status: GOAL MET    LONG TERM GOALS: Target date: 07/10/2023  Pt will demo 0 deg of R knee extension AROM and at least 115 deg of bilat knee flexion AROM for improved stiffness and mobility and for improved gait when he is able to ambulate more.   Baseline:  Goal status: NOT MET  12/4  2.  Pt will be independent and compliant with HEP for improved pain, ROM, strength, and function.  Baseline:  Goal status: 50% MET  12/4  3.  Pt will report at least a 50% improvement in stamina/tolerance with daily activities.  Baseline:  Goal status:  GOAL MET   12/4    PLAN:  PT FREQUENCY:  once every other week  PT DURATION: 6 weeks  PLANNED INTERVENTIONS: Therapeutic exercises, Therapeutic activity, Neuromuscular re-education, Balance training, Gait training, Patient/Family education, Self Care, Joint mobilization, Stair training, DME instructions, Electrical stimulation, Spinal mobilization, Cryotherapy, Moist heat, Taping, Ultrasound, Manual therapy, and Re-evaluation.  PLAN FOR NEXT SESSION:  Pt was appreciative of PT services.  He will be discharged from PT due to reaching maximum functional potential at this time and also good progress toward goals.  He is agreeable with discharge and will cont with HEP.    PHYSICAL THERAPY DISCHARGE SUMMARY  Visits from Start of Care: 8  Current functional level related to goals / functional outcomes: See above   Remaining deficits: See above   Education / Equipment: HEP    Audie Clear III PT, DPT 07/13/23 9:36 PM

## 2023-07-13 ENCOUNTER — Encounter (HOSPITAL_BASED_OUTPATIENT_CLINIC_OR_DEPARTMENT_OTHER): Payer: Self-pay | Admitting: Physical Therapy

## 2023-07-17 ENCOUNTER — Encounter (HOSPITAL_BASED_OUTPATIENT_CLINIC_OR_DEPARTMENT_OTHER): Payer: PPO | Attending: Internal Medicine | Admitting: Internal Medicine

## 2023-07-17 DIAGNOSIS — L97522 Non-pressure chronic ulcer of other part of left foot with fat layer exposed: Secondary | ICD-10-CM | POA: Diagnosis not present

## 2023-07-17 DIAGNOSIS — J45909 Unspecified asthma, uncomplicated: Secondary | ICD-10-CM | POA: Insufficient documentation

## 2023-07-17 DIAGNOSIS — G6281 Critical illness polyneuropathy: Secondary | ICD-10-CM | POA: Insufficient documentation

## 2023-07-17 DIAGNOSIS — L89892 Pressure ulcer of other site, stage 2: Secondary | ICD-10-CM | POA: Diagnosis not present

## 2023-07-17 DIAGNOSIS — I10 Essential (primary) hypertension: Secondary | ICD-10-CM | POA: Diagnosis not present

## 2023-07-17 DIAGNOSIS — M2170 Unequal limb length (acquired), unspecified site: Secondary | ICD-10-CM | POA: Diagnosis not present

## 2023-07-17 DIAGNOSIS — L97512 Non-pressure chronic ulcer of other part of right foot with fat layer exposed: Secondary | ICD-10-CM | POA: Diagnosis not present

## 2023-07-17 NOTE — Progress Notes (Signed)
[name], [name] ([id number]) [phone].pdf Page 1 of 16 Visit Report for 07/17/2023 Debridement Details Patient Name: Date of Service: [name], [name] [name] 07/17/2023 9:00 A M Medical Record Number: [id number] Patient Account Number: [id number] Date of Birth/Sex: Treating RN: 12-02-1975 (47 y.o. M) Primary Care Provider: Dorinda Hill Other Clinician: Referring Provider: Treating Provider/Extender: Huntley Dec in Treatment: 156 Debridement Performed for Assessment: Wound #5 Right,Plantar Foot Performed By: Physician Maxwell Caul., MD The following information was scribed by: [name] The information was scribed for: Baltazar Najjar Debridement Type: Debridement Level of Consciousness (Pre-procedure): Awake and Alert Pre-procedure Verification/Time Out Yes - 09:40 Taken: Start Time: 09:40 Pain Control: Lidocaine 5% topical ointment Percent of Wound Bed Debrided: 100% T Area Debrided (cm): otal 0.31 Tissue and other material debrided: Viable, Non-Viable, Eschar, Skin: Dermis , Skin: Epidermis Level: Skin/Epidermis Debridement Description: Selective/Open Wound Instrument: Curette Bleeding: Minimum Hemostasis Achieved: Pressure End Time: 09:43 Procedural Pain: 0 Post Procedural Pain: 0 Response [name] Treatment: Procedure was tolerated well Level of Consciousness (Post- Awake and Alert procedure): Post Debridement Measurements of Total Wound Length: (cm) 1 Stage: Category/Stage II Width: (cm) 0.4 Depth: (cm) 0.2 Volume: (cm) 0.063 Character of Wound/Ulcer Post Debridement: Improved Post Procedure Diagnosis Same as Pre-procedure Electronic Signature(s) Signed: 07/17/2023 4:43:54 PM By: Baltazar Najjar MD Entered By: Baltazar Najjar on 07/17/2023 10:23:36 -------------------------------------------------------------------------------- HPI Details Patient Name: Date of Service: [name], [name] [place] E. 07/17/2023 9:00 A M Medical  Record Number: [id number] Patient Account Number: [id number] Date of Birth/Sex: Treating RN: 24-Jan-1976 (47 y.o. M) Primary Care Provider: Dorinda Hill Other Clinician: Referring Provider: Treating Provider/Extender: Huntley Dec in Treatment: [address], [place] E ([id number]) [id number] Page 2 of 16 History of Present Illness HPI Description: Wounds are12/03/2020 upon evaluation today patient presents for initial inspection here in our clinic concerning issues he has been having with the bottoms of his feet bilaterally. He states these actually occurred as wounds when he was hospitalized for 5 months secondary [name] Covid. He was apparently with tilting bed where he was in an upright position quite frequently and apparently this occurred in some way shape or form during that time. Fortunately there is no sign of active infection at this time. No fevers, chills, nausea, vomiting, or diarrhea. With that being said he still has substantial wounds on the plantar aspects of his feet Theragen require quite a bit of work [name] get these [name] heal. He has been using Santyl currently though that is been problematic both in receiving the medication as well as actually paid for it as it is become quite expensive. Prior [name] the experience with Covid the patient really did not have any major medical problems other than hypertension he does have some mild generalized weakness following the Covid experience. 07/22/2020 on evaluation today patient appears [name] be doing okay in regard [name] his foot ulcers I feel like the wound beds are showing signs of better improvement that I do believe the Iodoflex is helping in this regard. With that being said he does have a lot of drainage currently and this is somewhat blue/green in nature which is consistent with Pseudomonas. I do think a culture today would be appropriate for [place] [name] evaluate and see if that is indeed the case I would likely  start him on antibiotic orally as well he is not allergic [name] Cipro knows of no issues he has had in the past 12/21; patient was admitted [name] the clinic earlier this  month with bilateral presumed pressure ulcers on the bottom of his feet apparently related [name] excessive pressure from a tilt table arrangement in the intensive care unit. Patient relates this [name] being on ECMO but I am not really sure that is exactly related [name] that. I must say I have never seen anything like this. He has fairly extensive full-thickness wounds extending from his heel towards his midfoot mostly centered laterally. There is already been some healing distally. He does not appear [name] have an arterial issue. He has been using gentamicin [name] the wound surfaces with Iodoflex [name] help with ongoing debridement 1/6; this is a patient with pressure ulcers on the bottom of his feet related [name] excessive pressure from a standing position in the intensive care unit. He is complaining of a lot of pain in the right heel. He is not a diabetic. 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 We have been using Iodoflex [name] help prepare the surfaces of both wounds for an advanced treatment product. He is nonambulatory spending most of his time in a wheelchair I have asked him not [name] propel the wheelchair with his heels 1/13; in general his wounds look better not much surface area change we have been using Iodoflex as of last week. I did an x-ray of the right heel as the patient was complaining of pain. I had some thoughts about a stress fracture perhaps Achilles tendon problems however what it showed was erosive changes along the inferior aspect of the calcaneus he now has a MRI booked for 1/20. 1/20; in general his wounds continue [name] be better. Some improvement in the large narrow areas proximally in his foot. He is still complaining of pain in the right heel and tenderness in certain areas of this wound. His MRI is tonight. I am  not just looking for osteomyelitis that was brought up on the x-ray I am wondering about stress fractures, tendon ruptures etc. He has no such findings on the left. Also noteworthy is that the patient had critical illness neuropathy and some of the discomfort may be actual improvement in nerve function I am just not sure. These wounds were initially in the setting of severe critical illness related [name] COVID-19. He was put in a standing position. He may have also been on pressors at the point contributing [name] tissue ischemia. By his description at some point these wounds were grossly necrotic extending proximally up into the Achilles part of his heel. I do not know that I have ever really seen pictures of them like this although they may exist in epic We have ordered Tri layer Oasis. I am trying [name] stimulate some granulation in these areas. This is of course assuming the MRI is negative for infection 1/27; since the patient was last here he saw Dr. Earlene Plater of infectious disease. He is planned for vancomycin and ceftriaxone. Prior operative culture grew MSSA. Also ordered baseline lab work. He also ordered arterial studies although the ABIs in our clinic were normal as well as his clinical exam these were normal I do not think he needs [name] see vascular surgery. His ABIs at the [facility] were 1.22 in the right triphasic waveforms with a normal TBI of 1.15 on the left ABI of 1.22 with triphasic waveforms and a normal TBI of 1.08. Finally he saw Dr. Logan Bores who will follow him in for 2 months. At this point I do not think he felt that he needed a procedure on the right  calcaneal bone. Dr. Earlene Plater is elected for broad-spectrum antibiotic The patient is still having pain in the right heel. He walks with a walker 2/3; wounds are generally smaller. He is tolerating his IV antibiotics. I believe this is vancomycin and ceftriaxone. We are still waiting for Oasis burn in terms of his out-of-pocket max which he should be  meeting soon given the IV antibiotics, MRIs etc. I have asked him [name] check in on this. We are using silver collagen in the meantime the wounds look better 2/10; tolerating IV vancomycin and Rocephin. We are waiting [name] apply for Oasis. Although I am not really sure where he is in his out-of-pocket max. 2/17 started the first application of Oasis trilayer. Still on antibiotics. The wounds have generally look better. The area on the left has a little more surface slough requiring debridement 2/24; second application of Oasis trilayer. The wound surface granulation is generally look better. The area on the left with undermining laterally I think is come in a bit. 10/08/2020 upon evaluation today patient is here today for [employer] application #3. Fortunately he seems [name] be doing extremely well with regard [name] this and we are seeing a lot of new epithelial growth which is great news. Fortunately there is no signs of active infection at this time. 10/16/2020 upon evaluation today patient appears [name] be doing well with regard [name] his foot ulcers. Do believe the Oasis has been of benefit for him. I do not see any signs of infection right now which is great news and I think that he has a lot of new epithelial growth which is great [name] see as well. The patient is very pleased [name] hear all of this. I do think we can proceed with the Oasis trilayer #4 today. 3/18; not as much improvement in these areas on his heels that I was hoping. I did reapply trilateral Oasis today the tissue looks healthier but not as much fill in as I was hoping. 3/25; better looking today I think this is come in a bit the tissue looks healthier. Triple layer Oasis reapplied #6 4/1; somewhat better looking definitely better looking surface not as much change in surface area as I was hoping. He may be spending more time [place] on days then he needs [name] although he does have heel offloading boots. Triple layer Oasis reapplied #7 4/7;  unfortunately apparently [facility] will not approve any further Oasis which is unfortunate since the patient did respond nicely both in terms of the condition of the wound bed as well as surface area. There is still some drainage coming from the wound but not a lot there does not appear [name] be any infection 4/15; we have been using Hydrofera Blue. He continues [name] have nice rims of epithelialization on the right greater than the left. The left the epithelialization is coming from the tip of his heel. There is moderate drainage. In this that concerns me about a total contact cast. There is no evidence of infection 4/29; patient has been using Hydrofera Blue with dressing changes. He has no complaints or issues today. 5/5; using Hydrofera Blue. I actually think that he looks marginally better than the last time I saw this 3 weeks ago. There are rims of epithelialization on the left thumb coming from the medial side on the right. Using Hydrofera Blue 5/12; using Hydrofera Blue. These continue [name] make improvements in surface area. His drainage was not listed as severe I therefore went ahead  and put a cast on the left foot. Right foot we will continue [name] dress his previous 5/16; back for first total contact cast change. He did not tolerate this particularly well cast injury on the anterior tibia among other issues. Difficulty sleeping. I talked him about this in some detail and afterwards is elected [name] continue. I told him I would like [name] have a cast on for 3 weeks [name] see if this is going [name] help at all. I think he agreed 5/19; I think the wound is better. There is no tunneling towards his midfoot. The undermining medially also looks better. He has a rim of new skin distally. I think we are making progress here. The area on the left also continues [name] look somewhat better [name] me using Hydrofera Blue. He has a list of complaints about the cast but none of them seem serious 5/26; patient presents  for 1 week follow-up. He has been using a total contact cast and tolerating this well. Hydrofera Blue is the main dressing used. He denies signs of infection. 6/2 Hydrofera Blue total contact cast on the left. These were large ulcers that formed in intensive care unit where the patient was recovering from COVID. May have had something to do with being ventilated in an upright positiono Pressors etc. We have been able [name] get the areas down considerably and a viable RAM, [facility] ([id number]) [phone].pdf Page 3 of 16 surface. There is some epithelialization in both sides. Note made of drainage 6/9; changed [name] [facility] last time because of drainage. He arrives with better looking surfaces and dimensions on the left than the right. Paradoxically the right actually probes more towards his midfoot the left is largely close down but both of these look improved. Using a total contact cast on the left 6/16; complex wounds on his bilateral plantar heels which were initially pressure injury from a stay in the ICU with COVID. We have been using silver alginate most recently. His dimensions of come in quite dramatically however not recently. We have been putting the left foot in a total contact cast 6/23; complex wounds on the bilateral plantar heels. I been putting the left in the cast paradoxically the area on the right is the one that is going towards closure at a faster rate. Quite a bit of drainage on the left. The patient went [name] see Dr. Logan Bores who said he was going [name] standby for skin grafts. I had actually considered sending him for skin grafts however he would be mandatorily off his feet for a period of weeks [name] months. I am thinking that the area on the right is going [name] close on its own the area on the left has been more stubborn even though we have him in a total contact cast 6/30; took him out of a total contact cast last week is the right heel seem [name] be making  better progress than the left where I was placing the cast. We are using silver alginate. Both wounds are smaller right greater than left 7/12; both wounds look as though they are making some progress. We are using silver alginate. Heel offloading boots 7/26; very gradual progress especially on the right. Using silver alginate. He is wearing heel offloading boots 8/18; he continues [name] close these wounds down very gradually. Using silver alginate. The problem polymen being definitive about this is areas of what appears [name] be callus around the margins. This is not a 100% of the area but  certainly sizable especially on the right 9/1; bilateral plantar feet wounds secondary [name] prolonged pressure while being ventilated for COVID-19 in an upright position. Essentially pressure ulcers on the bottom of his feet. He is made substantial progress using silver alginate. 9/14; bilateral plantar feet wounds secondary [name] prolonged pressure. Making progress using silver alginate. 9/29 bilateral plantar feet wounds secondary [name] prolonged pressure. I changed him [name] Iodoflex last week. MolecuLight showing reddened blush fluorescence 10/11; patient presents for follow-up. He has no issues or complaints today. He denies signs of infection. He continues [name] use Iodoflex and antibiotic ointment [name] the wound beds. 10/27; 2-week follow-up. No evidence of infection. He has callus and thick dry skin around the wound margins we have been using Iodoflex and Bactroban which was in response [name] a moderate left MolecuLight reddish blush fluorescence. 11/10; 2-week follow-up. Wound margins again have thick callus however the measurements of the actual wound sites are a lot smaller. Everything looks reasonably healthy here. We have been using Iodoflex He was approved for prime matrix but I have elected [name] delay this given the improvement in the surface area. Hopefully I will not regret that decision as were getting close [name] the end of  the year in terms of insurance payment 12/8; 2-week follow-up. Wounds are generally smaller in size. These were initially substantial wounds extending into the forefoot all the way into the heel on the bilateral plantar feet. They are now both located on the plantar heel distal aspect both of these have a lot of callus around the wounds I used a #5 curette [name] remove this on the right and the left also some subcutaneous debris [name] try and get the wound edges were using Iodoflex. He has heel offloading shoe 12/22; 2-week follow-up. Not really much improvement. He has thick callus around the outer edges of both wounds. I remove this there is some nonviable subcutaneous tissue as well. We have been using Iodoflex. Her intake nurse and myself spontaneously thought of a total contact cast I went back in May. At that time we really were not seeing much of an improvement with a cast although the wound was in a much different situation I would like [name] retry this in 2 weeks and I discussed this with the patient 08/12/2021; the patient has had some improvement with the Iodoflex. The the area on the left heel plantar more improved than the right. I had [name] put him in a total contact cast on the left although I decided [name] put that off for 2 weeks. I am going [name] change his primary dressing [name] silver collagen. I think in both areas he has had some improvement most of the healing seems [name] be more proximal in the heel. The wounds are in the mid aspect. A lot of thick callus on the right heel however. 1/19; we are using silver collagen on both plantar heel areas. He has had some improvement today. The left did not require any debridement. He still had some eschar on the right that was debrided but both seem [name] have contracted. I did not put it total contact cast on him today 2/2 we have been using silver collagen. The area on the right plantar heel has areas that appear [name] be epithelialized interspersed with dry flaking  callus and dry skin. I removed this. This really looks better than on the other side. On the left still a large area with raised edges and debris on the surface. The patient states he  is in the heel offloading boots for a prolonged period of time and really does not use any other footwear 2/6; patient presents for first cast exchange. He has no issues or complaints today. 2/9; not much change in the left foot wound with 1 week of a cast we are using silver collagen. Silver collagen on the right side. The right side has been the better wound surface. We will reapply the total contact cast on the left 2/16; not much improvement on either side I been using silver collagen with a total contact cast on the left. I'm changing the Hydrofera Blue still with a total contact cast on the left 2/23; some improvement on both sides. Disappointing that he has thick callus around the area that we are putting in a total contact cast on the left. We've been using Hydrofera Blue on both wound areas. This is a man who at essentially pressure ulcers in addition [name] ischemia caused by medications [name] support his blood pressure (pressors) in the ICU. He was being ventilated in the standing position for severe Covid. A Shiley the wounds extended across his entire foot but are now localized [name] his plantar heels bilaterally. We have made progress however neither areas healed. I continue [name] think the total contact cast is helped albeit painstakingly slowly. He has never wanted a plastic surgery consult although I don't know that they would be interested in grafting in area in this location. 10/07/2021: Continued improvement bilaterally. There is still some callus around the left wound, despite the total contact cast. He has some increased pain in his right midfoot around 1 particular area. This has been painful in the past but seems [name] be a little bit worse. When his cast was removed today, there was an area on the heel of the  left foot that looks a bit macerated. He is also complaining of pain in his left thigh and hip which he thinks is secondary [name] the limb length discrepancy caused by the cast. 10/14/2021: He continues [name] improve. A little bit less callus around the left wound. He continues [name] endorse pain in his right midfoot, but this is not as significant as it was last week. The maceration on his left heel is improved. 10/21/2021: Continued improvement [name] both wounds. The maceration on his left heel is no longer evident. Less callus bilaterally. Epithelialization progressing. 10/28/2021: Significant improvement this week. The right sided wound is nearly closed with just a small open area at the middle. No maceration seen on the left heel. Continued epithelialization on both sides. No concern for infection. 11/04/2021: T oday, the wounds were measured a little bit differently and come out as larger, but I actually think they are about the same [name] potentially even smaller, particularly on the left. He continues [name] accumulate some callus on the right. 11/11/2021: T oday, the patient is expressing some concern that the left wound, despite being in the total contact cast, is not progressing at the same rate as the 1 on the right. He is interested in trying a week without the cast [name] see how the wound does. The wounds are roughly the same size as last week, with the right perhaps being a little bit smaller. He continues [name] build up callus on both sites. 11/18/2021: Last week, I permitted the patient [name] go without his total contact cast, just [name] see if the cast was really making any difference. Today, both wounds have deteriorated [name] some extent, suggesting that the cast is  providing benefit, at least on the left. Both are larger and have accumulated callus, slough, and other debris. 11/26/2021: I debrided both wounds quite aggressively last week in an effort [name] stimulate the healing cascade. This appears [name] have been effective as  the left sided wound is a full centimeter shorter in length. Although the right was measured slightly larger, on inspection, it looks as though an area of epithelialized tissue was included in the measurements. We have been using PolyMem Ag on the wound surfaces with a total contact cast [name] the left. [name], [name] ([id number]) [phone].pdf Page 4 of 16 12/02/2021: It appears that the intake personnel are including epithelialized tissue in his wound measurements; the right wound is almost completely epithelialized; there is just a crater at the proximal midfoot with some open areas. On the left, he has built up some callus, but the overall wound surface looks good. There is some senescent skin around the wound margin. He has been in PolyMem Ag bilaterally with a total contact cast on the left. 12/09/2021: The right wound is nearly closed; there is just a small open area at the mid calcaneus. On the left, the wound is smaller with minimal callus buildup. No significant drainage. 12/16/2021: The right calcaneal wound remains minimally open at the mid calcaneus; the rest has epithelialized. On the left, the wound is also a little bit smaller. There is some senescent tissue on the periphery. He is getting his first application of a trial skin substitute called Vendaje today. 12/23/2021: The wound on his right calcaneus is nearly closed; there is just a small area at the most distal aspect of the calcaneus that is open. On the left, the area where we applied [name] the skin substitute has a healthier-looking bed of granulation tissue. The wound dimensions are not significantly different on this side but the wound surface is improved. 12/30/2021: The wound on the right calcaneus has not changed significantly aside from some accumulation of callus. On the left, the open area is smaller and continues [name] have an improved surface. He continues [name] accumulate callus around the wound. He is here  for his third application of Vendaje. 01/06/2022: The right calcaneal wound is down [name] just a couple of millimeters. He continues [name] accumulate periwound callus. He unfortunately got his cast wet earlier in the week and his left foot is macerated, resulting in some superficial skin loss just distal [name] the open wound. The open wound itself, however, is much smaller and has a healthier appearing surface. He is here for his fourth application of Vendaje. 01/13/2022: The right calcaneal wound is about the same. Unfortunately, once again, his cast got wet and his foot is again macerated. This is caused the left calcaneal wound [name] enlarge. He is here for his fifth application of Vendaje. 01/20/2022: The right calcaneal wound is very small. There is some periwound callus accumulation. He purchased a new cast protector last week and this has been effective in avoiding the maceration that has been occurring on the left. The left calcaneal wound is narrower and has a healthy and viable-appearing surface. He is here for his 6 application of Vendaje. 01/27/2022: The right calcaneal wound is down [name] just a pinhole. There is some periwound slough and callus. On the left, the wound is narrower and shorter by about a centimeter. The surface is robust and viable-appearing. Unfortunately, the rep for the trial skin substitute product did not provide any for [place] [name] use today. 02/04/2022: The right  calcaneal wound remains unchanged. There is more accumulated callus. On the left, although the intake nurse measured it a little bit longer, it looks about the same [name] me. The surface has a layer of slough, but underneath this, there is good granulation tissue. 02/10/2022: The right calcaneus wound is nearly closed. There is still some callus that builds up around the site. The left side looks about the same in terms of dimensions, but the surface is more robust and vital-appearing. 02/16/2022: The area of the right calcaneus that was  nearly closed last week has closed, but there is a small opening at the mid foot where it looks like some moisture got retained and caused some reopening. The left foot wound is narrower and shallower. Both sites have a fair amount of periwound callus and eschar. 02/24/2022: The small midfoot opening on the right calcaneus is a little bit smaller today. The left foot wound is narrower and shallower. He continues [name] accumulate periwound callus. No concern for infection. 03/01/2022: The patient came [name] clinic early because he showered and got his cast wet. Fortunately, there is no significant maceration [name] his foot but the callus softened and it looks like the wound on his left calcaneus may be a little bit wider. The wound on his right calcaneus is just a narrow slit. Continued accumulation of periwound callus bilaterally. 03/08/2022: The wound on his right calcaneus is very nearly closed, just a small pinpoint opening under a bit of eschar; the left wound has come in quite a bit, as well. It is narrower and shorter than at our last visit. Still with accumulated callus and eschar bilaterally. 03/17/2022: The right calcaneal wound is healed. The left wound is smaller and the surface itself is very clean, but there is some blue-green staining on the periwound callus, concerning for Pseudomonas aeruginosa. 03/23/2022: The right calcaneal wound remains closed. The left wound continues [name] contract. No further blue-green staining. Small amount of callus and slough accumulation. 03/28/2022: He came in early today because he had gotten his cast wet. On inspection, the wound itself did not get wet or macerated, just a little bit of the forefoot. The wound itself is basically unchanged. 04/07/2022: The right foot wound remains closed. The left wound is the smallest that I have seen it [name] date. It is narrower and shorter. It still continues [name] accumulate slough on the surface. 04/15/2022: There is a band of epithelium  now dividing the small left plantar foot wound in 2. There is still some slough on the surface. 04/21/2022: The wound continues [name] narrow. Just a little bit of slough on the surface. He seems [name] be responding well [name] endoform. 04/28/2022: Continued slow contraction of the wound. There is a little slough on the surface and some periwound callus. We have been using endoform and total contact cast. 05/05/2022: The wound appears [name] have stalled. There is slough and some periwound eschar/callus. No concern for infection, however. 05/12/2022: Unfortunately, his right foot has reopened. It is located at the most posterior aspect of his surgical incision. The area was noted [name] have drainage coming from it when his padding was removed today. Underneath some callus and senescent skin, there is an opening. No purulent drainage or malodor. On the left foot, the wound is again unchanged. There is some light blue staining on the callus, but no malodor or purulent drainage. 10/13; right and left heel remanence of extensive plantar foot wounds. These are better than I remember by quite a  big margin however he is still left with wounds on the left plantar heel and the right plantar heel. Been using endoform bilaterally. A culture was done that showed apparently Pseudomonas but we are still waiting for the [facility] antibiotic [name] use gentamicin today. He is still very active by description I am not sure about the offloading of his noncasted right foot 10/20; both wounds right and left heel debrided not much change from last week. [name] Echevaria has arrived which is linezolid, gentamicin and ciprofloxacin we will use this with endoform. T contact cast on the left otal 06/02/2022: Both wounds are smaller today. There is still a fair amount of callus buildup around the right foot ulcer. The left is more superficial and nearly flush with the surrounding tissues. Also with slough and eschar buildup. 06/10/2022: The right sided wound  appears [name] be nearly closed, if not completely so, although it is somewhat difficult [name] tell given the abnormal tissue and scarring in his foot. There is a fair amount of callus and crust accumulation. On the left, the wound looks about the same, again with callus and slough. He has an appointment next Thursday with Dr. Annamary Rummage in podiatry; I am hopeful that there may be some reconstructive options available for Mr. Eadie. 06/16/2022: Both wounds have some eschar and callus accumulation. The right sided wound is extremely narrow and barely open; the left is narrower than last [name], [name] ([id number]) [id number].pdf Page 5 of 16 week. There is a little bit of slough. He has his appointment in podiatry later today. 06/23/2022: The patient met with Dr. Annamary Rummage last week and unfortunately, there are no reconstructive options that he believes would be helpful. He did order an MRI [name] evaluate for osteomyelitis and fortunately, none was seen. The left sided wound is a little bit shorter and narrower today. The right sided wound is about the same. There is callus and eschar accumulation bilaterally. 06/29/2022: Both feet have improved from last week. There is epithelium making a valiant effort [name] creep across the surface on the left. The right side looks like it got a little dry and the deep crevasse in his midfoot has cracked. Both have eschar and there is some slough on the left. 07/07/2022: Both feet have improved. There is epithelium completely covering the calcaneus on the right with just a small opening in the crevasse in his midfoot. On the left, the open area of tissue is smaller but he continues [name] build up callus/eschar and slough. 07/15/2022: The opening in the midfoot on the right is about the same size, covered with eschar and a little bit of slough. The open portion of the left wound is narrower and shorter with a bit of slough buildup. He admits [name] being on his  feet more than recommended. 12/14; as far as I can tell everything on the right foot is closed. There is some eschar I removed some of this I cannot identify any open wound here. As usual this will be a very vulnerable area going forward. On the left this looks really quite healthy. I was pleasantly surprised [name] see how good this looked. Wound is certainly smaller and there appears [name] be healthy epithelialization. He has been using Promogran on the right and endoform on the left. He has been offloading the right foot with a heel offloading boot and he has a running shoe on the right foot 08/04/2022: The right foot remains closed. He has a thick cushioned insole in his sneaker. The  left sided wound is smaller with just some slough and eschar accumulation. He is wearing the heel off loader on this foot. 08/15/2022: The right foot remains closed. The left wound has narrowed further. There is some slough and eschar accumulation. 08/25/2022: We put him in a peg assist shoe insert and as a result, he has more epithelialization of the ulcer with minimal slough and eschar accumulation. 09/01/2022: The wound is smaller by about half this week. Still with some slough on the surface. The peg assist shoe seems [name] be doing a remarkable job of adequately offloading the site. 09/08/2022: There is a little bit more epithelium coming in. There is some slough and callus buildup. 09/16/2022: The wound measures about the same size, but the epithelium that has grown in looks more robust and stronger. There is some slough and callus buildup. 09/23/2022: The wound remains about the same size. The skin edges are looking rather senescent. 09/29/2022: The aggressive debridement I performed last week seems [name] have been effective. The wound is smaller and has significantly less slough accumulation. 10/06/2022: There has been quite a bit of epithelialization since last week. There are still some open areas with slough accumulation. There  is some callus buildup around the perimeter. 10/13/2022: Continued improvement. Minimal callus accumulation. 3/14; patient presents for follow-up. He has been using endoform [name] the wound bed without issues. He is using a surgical shoe with peg assist for offloading. 10/27/2022: The wound dimensions are stable. There is some senescent skin accumulation around the perimeter. 11/04/2022: Last week I performed a very aggressive debridement in an effort [name] stimulate the healing cascade. As has been the case when I have done this before, the wound has responded well. There has been epithelialization and contraction of the wound. There are just a couple of small open areas. 11/10/2022: Unfortunately, his foot got wet secondary [name] sweating and there has been some breakdown of the tissue, particularly the skin just adjacent [name] the main ulcer. 11/18/2022: We continue [name] struggle with moisture-related tissue breakdown around the perimeter of his wound. This has caused the thin epithelium that had formed on the surface [name] disintegrate. The underlying surface of the wound has good granulation tissue. 11/25/2022: The edges of the wound are much less macerated, but the surface is actually looking a little bit dry. There is slough accumulation. No obvious signs of infection. 12/01/2022: The wound edges are more macerated and broken down, making the wound larger. The surface has some slough on it. 12/08/2020: The wound looks better this week. There is significantly less maceration and moisture-related tissue breakdown. There is some slough on the wound surface. 12/15/2022: The wound continues [name] improve. There is almost no moisture-related tissue breakdown. There is epithelium beginning [name] fill in again from the edges. Light slough on the wound surface. 12/22/2022: More epithelium has filled him from around the edges. There is some slough on the surface. No evidence of moisture-related tissue breakdown. 01/05/2023: There has  been more epithelialization, particularly at the posterior calcaneal aspect of the wound. There has been a little bit of moisture accumulation with minimal maceration. 01/12/2023: More epithelium has filled in. There is very minimal accumulation of slough. 01/20/2023: The wound is measuring a little bit smaller today. It did measure deeper, but this appears [name] be secondary [name] some heaped up senescent skin around the edges. The epithelium that has been present at the posterior aspect of the wound seems [name] be a bit more robust with greater integrity. 01/26/2023: He  reports intense itching [name] the skin around the wound and there has been some breakdown here. It looks fungal in nature. The wound itself looks pretty good with improved epithelialization with just some slough buildup. He has a little callus along the wound edges. 02/02/2023: He has been treating the periwound skin with an over-the-counter athlete's foot medication and notes significant improvement in his pruritus. The skin looks better here, as well. The wound continues [name] epithelialize. There is light slough accumulation. 02/10/2023: The periwound skin still looks fairly angry. He reports multiple small blisters that have opened up. He is not having the intense itching that he had prior, however. There has been more epithelialization at the calcaneus and there is minimal slough accumulation. 02/16/2023: The periwound skin is improving. An online review of dermatology rash images suggest that this may be hand-foot-and-mouth disease, isolated [name] the feet; the patient says he actually had this as a child. The calcaneus continues [name] epithelialize and the tissue was quite robust here. Very minimal slough [name], [name] ([id number]) [phone].pdf Page 6 of 16 accumulation on the open portion of the wound with a little bit of periwound callus buildup. 03/03/2023: The wound measurements are about the same, but there is more  epithelium making its way [name] the surface. There is a little bit of crust and eschar around the edges and minimal slough on the surface. 03/09/2023: His dressing was applied rather haphazardly and the drawtex was not applied. The Optifoam was also reversed such that the orange side was in contact with his bare skin. He has some maceration around the wound edges, but this did not appear [name] result in any tissue breakdown. There is a little bit of slough on the surface and minimal eschar around the edges. 03/15/2023: The wound measured narrower today. There is some slough accumulation on the surface and some eschar around the edges. 03/23/2023: The length of the wound remains stable, but it continues [name] narrow. The skin that has covered the posterior aspect of the calcaneus is more robust and has better tensile integrity. Minimal slough and eschar accumulation. 03/30/2023: The wound length has decreased. This seems [name] be primarily due [name] more epithelium covering the posterior aspect of the site. Minimal slough and eschar buildup. 04/07/2023: The wound measured a little bit smaller again today. There is minimal slough accumulation with just a little bit of eschar and callus around the edges. 04/13/2023: The lateral aspect of his wound is starting [name] epithelialize. There is very minimal slough on the surface. Moisture control is good. 04/20/2023: Continued, albeit extremely slow, encroachment of epithelium as of the wound surface. Minimal slough. Moisture balance is good. 04/27/2023: More epithelium is filling in from the heel area. There is a little bit of slough on the surface with some periwound callus and eschar. He did note a little bit of drainage coming from his right foot and there is a small crack in the skin extending medially from the main site of his prior ulceration. 05/04/2023: The wounds are stable this week. No significant change. There is a little bit of callus buildup around the edges of each site and  some thin slough on the surface on the left foot. 05/11/2023: The skin on the right heel is filling in further. There is a little bit of slough accumulation on both surfaces along with periwound callus accumulation. 05/18/2023: Both wounds are smaller today. The right foot is down [name] just a sliver of opening. 05/25/2023: Epithelium continues [name] advance from the  calcaneal area towards the midfoot on the left. On the right, there is tiniest crack of an opening that remains. 06/01/2023: No significant change bilaterally. He has built up quite a bit of callus on the calcaneal surface of the right foot. 06/15/2023: He missed his appointment last week. The wounds measured larger today. There is some slough and eschar accumulation on both wounds. He was discussed in the [facility] wound camp last week. The consensus was in favor of proceeding with plastic surgery intervention but they also suggested repeating the MRI imaging of his heels [name] ensure no osteomyelitis present. 06/22/2023: The wounds are basically stable. His MRI is scheduled for tomorrow and his surgery for a week from tomorrow, 22 November. 12/9; patient I know from some months ago. 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 He saw Dr. [name] aylor from plastic surgery who is planning surgery. He is received his okay from pulmonology and he sees cardiology on Wednesday. He has been using silver alginate [name] both wound areas. The area on the left heel actually looks fairly good healthy looking granulation some epithelialization towards the tip of the heel. Right heel is almost completely eschared. Electronic Signature(s) Signed: 07/17/2023 4:43:54 PM By: Baltazar Najjar MD Entered By: Baltazar Najjar on 07/17/2023 10:25:04 -------------------------------------------------------------------------------- Physical Exam Details Patient Name: Date of Service: [name], [name] [place] E. 07/17/2023 9:00 A M Medical Record Number:  [id number] Patient Account Number: [id number] Date of Birth/Sex: Treating RN: October 20, 1975 (47 y.o. M) Primary Care Provider: Dorinda Hill Other Clinician: Referring Provider: Treating Provider/Extender: Huntley Dec in Treatment: 156 Constitutional Sitting or standing Blood Pressure is within target range for patient.. Pulse regular and within target range for patient.Marland Kitchen Respirations regular, non-labored and within target range.. Temperature is normal and within the target range for the patient.Marland Kitchen Appears in no distress. Notes Wound exam; large amount of eschar over the entirety of the right plantar heel extending towards the right midfoot. I removed this with a #5 curette. No debridement on the left which actually appears [name] have some decent looking granulation. No exposed bone in either area. Electronic Signature(s) Signed: 07/17/2023 4:43:54 PM By: Baltazar Najjar MD Entered By: Baltazar Najjar on 07/17/2023 10:25:51 [name] ([id number]) [id number] Page 7 of 16 -------------------------------------------------------------------------------- Physician Orders Details Patient Name: Date of Service: [name], [name] [name] 07/17/2023 9:00 A M Medical Record Number: [id number] Patient Account Number: [id number] Date of Birth/Sex: Treating RN: Dec 16, 1975 (47 y.o. [name] Primary Care Provider: Dorinda Hill Other Clinician: Referring Provider: Treating Provider/Extender: Huntley Dec in Treatment: 156 Verbal / Phone Orders: No Diagnosis Coding Follow-up Appointments ppointment in 1 week. - Dr. Lady Gary Return A ppointment in: - ++++call [name] schedule appointment if needed after plastic surgery Return A Anesthetic Wound #2 Left Calcaneus (In clinic) Topical Lidocaine 4% applied [name] wound bed - In clinic Bathing/ Shower/ Hygiene May shower and wash wound with soap and water. Off-Loading Other: - keep pressure off  of the bottom of your feet. Elevate legs throughout the day. Use the Shoe with the PegAssist off-loading insole Additional Orders / Instructions Follow Nutritious Diet - Try [name] get 70-100 grams of Protein a day+ Coupons for Juven sent with patient. Other: - 500 mg per day [name] start, then increase [name] 500mg  twice a day. Also consider adding a zinc supplement. Juven Shake 1-2 times daily. Wound Treatment Wound #2 - Calcaneus Wound Laterality: Left Cleanser: Normal Saline (Generic) 1 x Per  Day/Other:until healed Discharge Instructions: Cleanse the wound with Normal Saline prior [name] applying a clean dressing using gauze sponges, not tissue or cotton balls. Cleanser: Wound Cleanser (Generic) 1 x Per Day/Other:until healed Discharge Instructions: Cleanse the wound with wound cleanser prior [name] applying a clean dressing using gauze sponges, not tissue or cotton balls. Peri-Wound Care: Ketoconazole Cream 2% (Generic) 1 x Per Day/Other:until healed Discharge Instructions: Apply Ketoconazole mixed with zinc [name] the periwound Peri-Wound Care: Zinc Oxide Ointment 30g tube (Generic) 1 x Per Day/Other:until healed Discharge Instructions: Apply Zinc Oxide [name] periwound with each dressing change Topical: Gentamicin (Generic) 1 x Per Day/Other:until healed Discharge Instructions: apply [name] the wound bed Topical: Ketoconazole Cream 2% 1 x Per Day/Other:until healed Discharge Instructions: Apply Ketoconazole [name] periwound Prim Dressing: Maxorb Extra Ag+ Alginate Dressing, 4x4.75 (in/in) (Generic) 1 x Per Day/Other:until healed ary Discharge Instructions: Apply [name] wound bed as instructed Secondary Dressing: Drawtex 4x4 in (Generic) 1 x Per Day/Other:until healed Discharge Instructions: Apply over primary dressing as directed. Secondary Dressing: Optifoam Non-Adhesive Dressing, 4x4 in (Generic) 1 x Per Day/Other:until healed Discharge Instructions: As needed- Apply over primary dressing as directed. Secondary  Dressing: Woven Gauze Sponge, Non-Sterile 4x4 in (Generic) 1 x Per Day/Other:until healed Discharge Instructions: Apply over primary dressing as directed. Secured With: 85M Medipore H Soft Cloth Surgical T ape, 4 x 10 (in/yd) (Generic) 1 x Per Day/Other:until healed Discharge Instructions: Secure with tape as directed. Compression Wrap: Kerlix Roll 4.5x3.1 (in/yd) (Generic) 1 x Per Day/Other:until healed [name], DEMINT ([id number]) [id number].pdf Page 8 of 16 Discharge Instructions: Apply Kerlix and Coban compression as directed. Add-Ons: [employer] Offloading Boot, Size Regular 1 x Per Day/Other:until healed Wound #5 - Foot Wound Laterality: Plantar, Right Peri-Wound Care: Ketoconazole Cream 2% 1 x Per Day/30 Days Discharge Instructions: Apply Ketoconazole [name] periwound as needed Peri-Wound Care: Zinc Oxide Ointment 30g tube 1 x Per Day/30 Days Discharge Instructions: Apply Zinc Oxide [name] periwound with each dressing change Topical: Gentamicin 1 x Per Day/30 Days Discharge Instructions: As directed by physician Prim Dressing: Maxorb Extra Ag+ Alginate Dressing, 4x4.75 (in/in) 1 x Per Day/30 Days ary Discharge Instructions: Apply [name] wound bed as instructed Secondary Dressing: Drawtex 4x4 in 1 x Per Day/30 Days Discharge Instructions: As needed -Apply over primary dressing as directed. Secondary Dressing: Optifoam Non-Adhesive Dressing, 4x4 in 1 x Per Day/30 Days Discharge Instructions: Apply over primary dressing as directed. Secured With: [employer], 4.5x3.1 (in/yd) 1 x Per Day/30 Days Discharge Instructions: Secure with Kerlix as directed. Electronic Signature(s) Signed: 07/17/2023 4:42:52 PM By: [name] RN Signed: 07/17/2023 4:43:54 PM By: Baltazar Najjar MD Entered By: [name] on 07/17/2023 09:55:35 -------------------------------------------------------------------------------- Problem List Details Patient Name: Date of Service: [name], [name] [place] E. 07/17/2023 9:00 A M Medical Record Number: [id number] Patient Account Number: [id number] Date of Birth/Sex: Treating RN: Sep 22, 1975 (48 y.o. M) Primary Care Provider: Dorinda Hill Other Clinician: Referring Provider: Treating Provider/Extender: Huntley Dec in Treatment: 156 Active Problems ICD-10 Encounter Code Description Active Date MDM Diagnosis [phone] Non-pressure chronic ulcer of other part of right foot with fat layer exposed 04/27/2023 No Yes L97.522 Non-pressure chronic ulcer of other part of left foot with fat layer exposed 09/03/2020 No Yes Inactive Problems ICD-10 Code Description Active Date Inactive Date L89.893 Pressure ulcer of other site, stage 3 07/15/2020 07/15/2020 M62.81 Muscle weakness (generalized) 07/15/2020 07/15/2020 [name] ([id number]) [phone].pdf Page 9 of 16 I10 Essential (primary) hypertension 07/15/2020 07/15/2020 M86.171 Other acute osteomyelitis,  right ankle and foot 09/03/2020 09/03/2020 Resolved Problems Electronic Signature(s) Signed: 07/17/2023 4:43:54 PM By: Baltazar Najjar MD Entered By: Baltazar Najjar on 07/17/2023 10:23:14 -------------------------------------------------------------------------------- Progress Note Details Patient Name: Date of Service: [name], [name] [place] E. 07/17/2023 9:00 A M Medical Record Number: [id number] Patient Account Number: [id number] Date of Birth/Sex: Treating RN: [id number] (47 y.o. M) Primary Care Provider: Dorinda Hill Other Clinician: Referring Provider: Treating Provider/Extender: Huntley Dec in Treatment: 156 Subjective History of Present Illness (HPI) Wounds are12/03/2020 upon evaluation today patient presents for initial inspection here in our clinic concerning issues he has been having with the bottoms of his feet bilaterally. He states these actually occurred as wounds when he was hospitalized for 5 months secondary [name]  Covid. He was apparently with tilting bed where he was in an upright position quite frequently and apparently this occurred in some way shape or form during that time. Fortunately there is no sign of active infection at this time. No fevers, chills, nausea, vomiting, or diarrhea. With that being said he still has substantial wounds on the plantar aspects of his feet Theragen require quite a bit of work [name] get these [name] heal. He has been using Santyl currently though that is been problematic both in receiving the medication as well as actually paid for it as it is become quite expensive. Prior [name] the experience with Covid the patient really did not have any major medical problems other than hypertension he does have some mild generalized weakness following the Covid experience. 07/22/2020 on evaluation today patient appears [name] be doing okay in regard [name] his foot ulcers I feel like the wound beds are showing signs of better improvement that I do believe the Iodoflex is helping in this regard. With that being said he does have a lot of drainage currently and this is somewhat blue/green in nature which is consistent with Pseudomonas. I do think a culture today would be appropriate for [place] [name] evaluate and see if that is indeed the case I would likely start him on antibiotic orally as well he is not allergic [name] Cipro knows of no issues he has had in the past 12/21; patient was admitted [name] the clinic earlier this month with bilateral presumed pressure ulcers on the bottom of his feet apparently related [name] excessive pressure from a tilt table arrangement in the intensive care unit. Patient relates this [name] being on ECMO but I am not really sure that is exactly related [name] that. I must say I have never seen anything like this. He has fairly extensive full-thickness wounds extending from his heel towards his midfoot mostly centered laterally. There is already been some healing distally. He does not appear [name] have an  arterial issue. He has been using gentamicin [name] the wound surfaces with Iodoflex [name] help with ongoing debridement 1/6; this is a patient with pressure ulcers on the bottom of his feet related [name] excessive pressure from a standing position in the intensive care unit. He is complaining of a lot of pain in the right heel. He is not a diabetic. 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 We have been using Iodoflex [name] help prepare the surfaces of both wounds for an advanced treatment product. He is nonambulatory spending most of his time in a wheelchair I have asked him not [name] propel the wheelchair with his heels 1/13; in general his wounds look better not much surface area change we have been using Iodoflex as  of last week. I did an x-ray of the right heel as the patient was complaining of pain. I had some thoughts about a stress fracture perhaps Achilles tendon problems however what it showed was erosive changes along the inferior aspect of the calcaneus he now has a MRI booked for 1/20. 1/20; in general his wounds continue [name] be better. Some improvement in the large narrow areas proximally in his foot. He is still complaining of pain in the right heel and tenderness in certain areas of this wound. His MRI is tonight. I am not just looking for osteomyelitis that was brought up on the x-ray I am wondering about stress fractures, tendon ruptures etc. He has no such findings on the left. Also noteworthy is that the patient had critical illness neuropathy and some of the discomfort may be actual improvement in nerve function I am just not sure. These wounds were initially in the setting of severe critical illness related [name] COVID-19. He was put in a standing position. He may have also been on pressors at the point contributing [name] tissue ischemia. By his description at some point these wounds were grossly necrotic extending proximally up into the Achilles part of his heel. I do not know  that I have ever really seen pictures of them like this although they may exist in epic We have ordered Tri layer Oasis. I am trying [name] stimulate some granulation in these areas. This is of course assuming the MRI is negative for infection 1/27; since the patient was last here he saw Dr. Earlene Plater of infectious disease. He is planned for vancomycin and ceftriaxone. Prior operative culture grew MSSA. Also ordered baseline lab work. He also ordered arterial studies although the ABIs in our clinic were normal as well as his clinical exam these were normal I do not think he needs [name] see vascular surgery. His ABIs at the [facility] were 1.22 in the right triphasic waveforms with a normal TBI of 1.15 on the left ABI of 1.22 with triphasic waveforms and a normal TBI of 1.08. Finally he saw Dr. Logan Bores who will follow him in for 2 months. At this point I do not think he felt that he needed a procedure on the right calcaneal bone. Dr. Earlene Plater is elected for broad-spectrum antibiotic The patient is still having pain in the right heel. He walks with a walker 2/3; wounds are generally smaller. He is tolerating his IV antibiotics. I believe this is vancomycin and ceftriaxone. We are still waiting for Oasis burn in terms [name], [name] ([id number]) [phone].pdf Page 10 of 16 of his out-of-pocket max which he should be meeting soon given the IV antibiotics, MRIs etc. I have asked him [name] check in on this. We are using silver collagen in the meantime the wounds look better 2/10; tolerating IV vancomycin and Rocephin. We are waiting [name] apply for Oasis. Although I am not really sure where he is in his out-of-pocket max. 2/17 started the first application of Oasis trilayer. Still on antibiotics. The wounds have generally look better. The area on the left has a little more surface slough requiring debridement 2/24; second application of Oasis trilayer. The wound surface granulation is generally look  better. The area on the left with undermining laterally I think is come in a bit. 10/08/2020 upon evaluation today patient is here today for [employer] application #3. Fortunately he seems [name] be doing extremely well with regard [name] this and we are seeing a lot of new epithelial  growth which is great news. Fortunately there is no signs of active infection at this time. 10/16/2020 upon evaluation today patient appears [name] be doing well with regard [name] his foot ulcers. Do believe the Oasis has been of benefit for him. I do not see any signs of infection right now which is great news and I think that he has a lot of new epithelial growth which is great [name] see as well. The patient is very pleased [name] hear all of this. I do think we can proceed with the Oasis trilayer #4 today. 3/18; not as much improvement in these areas on his heels that I was hoping. I did reapply trilateral Oasis today the tissue looks healthier but not as much fill in as I was hoping. 3/25; better looking today I think this is come in a bit the tissue looks healthier. Triple layer Oasis reapplied #6 4/1; somewhat better looking definitely better looking surface not as much change in surface area as I was hoping. He may be spending more time [place] on days then he needs [name] although he does have heel offloading boots. Triple layer Oasis reapplied #7 4/7; unfortunately apparently [facility] will not approve any further Oasis which is unfortunate since the patient did respond nicely both in terms of the condition of the wound bed as well as surface area. There is still some drainage coming from the wound but not a lot there does not appear [name] be any infection 4/15; we have been using Hydrofera Blue. He continues [name] have nice rims of epithelialization on the right greater than the left. The left the epithelialization is coming from the tip of his heel. There is moderate drainage. In this that concerns me about a total contact cast.  There is no evidence of infection 4/29; patient has been using Hydrofera Blue with dressing changes. He has no complaints or issues today. 5/5; using Hydrofera Blue. I actually think that he looks marginally better than the last time I saw this 3 weeks ago. There are rims of epithelialization on the left thumb coming from the medial side on the right. Using Hydrofera Blue 5/12; using Hydrofera Blue. These continue [name] make improvements in surface area. His drainage was not listed as severe I therefore went ahead and put a cast on the left foot. Right foot we will continue [name] dress his previous 5/16; back for first total contact cast change. He did not tolerate this particularly well cast injury on the anterior tibia among other issues. Difficulty sleeping. I talked him about this in some detail and afterwards is elected [name] continue. I told him I would like [name] have a cast on for 3 weeks [name] see if this is going [name] help at all. I think he agreed 5/19; I think the wound is better. There is no tunneling towards his midfoot. The undermining medially also looks better. He has a rim of new skin distally. I think we are making progress here. The area on the left also continues [name] look somewhat better [name] me using Hydrofera Blue. He has a list of complaints about the cast but none of them seem serious 5/26; patient presents for 1 week follow-up. He has been using a total contact cast and tolerating this well. Hydrofera Blue is the main dressing used. He denies signs of infection. 6/2 Hydrofera Blue total contact cast on the left. These were large ulcers that formed in intensive care unit where the patient was recovering from COVID.  May have had something to do with being ventilated in an upright positiono Pressors etc. We have been able [name] get the areas down considerably and a viable surface. There is some epithelialization in both sides. Note made of drainage 6/9; changed [name] [facility] last time because of  drainage. He arrives with better looking surfaces and dimensions on the left than the right. Paradoxically the right actually probes more towards his midfoot the left is largely close down but both of these look improved. Using a total contact cast on the left 6/16; complex wounds on his bilateral plantar heels which were initially pressure injury from a stay in the ICU with COVID. We have been using silver alginate most recently. His dimensions of come in quite dramatically however not recently. We have been putting the left foot in a total contact cast 6/23; complex wounds on the bilateral plantar heels. I been putting the left in the cast paradoxically the area on the right is the one that is going towards closure at a faster rate. Quite a bit of drainage on the left. The patient went [name] see Dr. Logan Bores who said he was going [name] standby for skin grafts. I had actually considered sending him for skin grafts however he would be mandatorily off his feet for a period of weeks [name] months. I am thinking that the area on the right is going [name] close on its own the area on the left has been more stubborn even though we have him in a total contact cast 6/30; took him out of a total contact cast last week is the right heel seem [name] be making better progress than the left where I was placing the cast. We are using silver alginate. Both wounds are smaller right greater than left 7/12; both wounds look as though they are making some progress. We are using silver alginate. Heel offloading boots 7/26; very gradual progress especially on the right. Using silver alginate. He is wearing heel offloading boots 8/18; he continues [name] close these wounds down very gradually. Using silver alginate. The problem polymen being definitive about this is areas of what appears [name] be callus around the margins. This is not a 100% of the area but certainly sizable especially on the right 9/1; bilateral plantar feet wounds secondary [name]  prolonged pressure while being ventilated for COVID-19 in an upright position. Essentially pressure ulcers on the bottom of his feet. He is made substantial progress using silver alginate. 9/14; bilateral plantar feet wounds secondary [name] prolonged pressure. Making progress using silver alginate. 9/29 bilateral plantar feet wounds secondary [name] prolonged pressure. I changed him [name] Iodoflex last week. MolecuLight showing reddened blush fluorescence 10/11; patient presents for follow-up. He has no issues or complaints today. He denies signs of infection. He continues [name] use Iodoflex and antibiotic ointment [name] the wound beds. 10/27; 2-week follow-up. No evidence of infection. He has callus and thick dry skin around the wound margins we have been using Iodoflex and Bactroban which was in response [name] a moderate left MolecuLight reddish blush fluorescence. 11/10; 2-week follow-up. Wound margins again have thick callus however the measurements of the actual wound sites are a lot smaller. Everything looks reasonably healthy here. We have been using Iodoflex He was approved for prime matrix but I have elected [name] delay this given the improvement in the surface area. Hopefully I will not regret that decision as were getting close [name] the end of the year in terms of insurance payment 12/8; 2-week  follow-up. Wounds are generally smaller in size. These were initially substantial wounds extending into the forefoot all the way into the heel on the bilateral plantar feet. They are now both located on the plantar heel distal aspect both of these have a lot of callus around the wounds I used a #5 curette [name] remove this on the right and the left also some subcutaneous debris [name] try and get the wound edges were using Iodoflex. He has heel offloading shoe 12/22; 2-week follow-up. Not really much improvement. He has thick callus around the outer edges of both wounds. I remove this there is some nonviable subcutaneous tissue  as well. We have been using Iodoflex. Her intake nurse and myself spontaneously thought of a total contact cast I went back in May. At that time we really were not seeing much of an improvement with a cast although the wound was in a much different situation I would like [name] retry this in 2 weeks and I discussed this with the patient 08/12/2021; the patient has had some improvement with the Iodoflex. The the area on the left heel plantar more improved than the right. I had [name] put him in a total contact cast on the left although I decided [name] put that off for 2 weeks. I am going [name] change his primary dressing [name] silver collagen. I think in both areas he has had some improvement most of the healing seems [name] be more proximal in the heel. The wounds are in the mid aspect. A lot of thick callus on the right heel however. 1/19; we are using silver collagen on both plantar heel areas. He has had some improvement today. The left did not require any debridement. He still had some eschar on the right that was debrided but both seem [name] have contracted. I did not put it total contact cast on him today 2/2 we have been using silver collagen. The area on the right plantar heel has areas that appear [name] be epithelialized interspersed with dry flaking callus and dry skin. I removed this. This really looks better than on the other side. On the left still a large area with raised edges and debris on the surface. 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 He has no issues or complaints today. [name], [name] ([id number]) [phone].pdf Page 11 of 16 2/9; not much change in the left foot wound with 1 week of a cast we are using silver collagen. Silver collagen on the right side. The right side has been the better wound surface. We will reapply the total contact cast on the  left 2/16; not much improvement on either side I been using silver collagen with a total contact cast on the left. I'm changing the Hydrofera Blue still with a total contact cast on the left 2/23; some improvement on both sides. Disappointing that he has thick callus around the area that we are putting in a total contact cast on the left. We've been using Hydrofera Blue on both wound areas. This is a man who at essentially pressure ulcers in addition [name] ischemia caused by medications [name] support his blood pressure (pressors) in the ICU. He was being ventilated in the standing position for severe Covid. A Shiley the wounds extended across his entire foot but are now localized [name] his plantar heels bilaterally. We have made progress however  neither areas healed. I continue [name] think the total contact cast is helped albeit painstakingly slowly. He has never wanted a plastic surgery consult although I don't know that they would be interested in grafting in area in this location. 10/07/2021: Continued improvement bilaterally. There is still some callus around the left wound, despite the total contact cast. He has some increased pain in his right midfoot around 1 particular area. This has been painful in the past but seems [name] be a little bit worse. When his cast was removed today, there was an area on the heel of the left foot that looks a bit macerated. He is also complaining of pain in his left thigh and hip which he thinks is secondary [name] the limb length discrepancy caused by the cast. 10/14/2021: He continues [name] improve. A little bit less callus around the left wound. He continues [name] endorse pain in his right midfoot, but this is not as significant as it was last week. The maceration on his left heel is improved. 10/21/2021: Continued improvement [name] both wounds. The maceration on his left heel is no longer evident. Less callus bilaterally. Epithelialization progressing. 10/28/2021: Significant improvement this  week. The right sided wound is nearly closed with just a small open area at the middle. No maceration seen on the left heel. Continued epithelialization on both sides. No concern for infection. 11/04/2021: T oday, the wounds were measured a little bit differently and come out as larger, but I actually think they are about the same [name] potentially even smaller, particularly on the left. He continues [name] accumulate some callus on the right. 11/11/2021: T oday, the patient is expressing some concern that the left wound, despite being in the total contact cast, is not progressing at the same rate as the 1 on the right. He is interested in trying a week without the cast [name] see how the wound does. The wounds are roughly the same size as last week, with the right perhaps being a little bit smaller. He continues [name] build up callus on both sites. 11/18/2021: Last week, I permitted the patient [name] go without his total contact cast, just [name] see if the cast was really making any difference. Today, both wounds have deteriorated [name] some extent, suggesting that the cast is providing benefit, at least on the left. Both are larger and have accumulated callus, slough, and other debris. 11/26/2021: I debrided both wounds quite aggressively last week in an effort [name] stimulate the healing cascade. This appears [name] have been effective as the left sided wound is a full centimeter shorter in length. Although the right was measured slightly larger, on inspection, it looks as though an area of epithelialized tissue was included in the measurements. We have been using PolyMem Ag on the wound surfaces with a total contact cast [name] the left. 12/02/2021: It appears that the intake personnel are including epithelialized tissue in his wound measurements; the right wound is almost completely epithelialized; there is just a crater at the proximal midfoot with some open areas. On the left, he has built up some callus, but the overall wound surface  looks good. There is some senescent skin around the wound margin. He has been in PolyMem Ag bilaterally with a total contact cast on the left. 12/09/2021: The right wound is nearly closed; there is just a small open area at the mid calcaneus. On the left, the wound is smaller with minimal callus buildup. No significant drainage. 12/16/2021: The right calcaneal wound remains  minimally open at the mid calcaneus; the rest has epithelialized. On the left, the wound is also a little bit smaller. There is some senescent tissue on the periphery. He is getting his first application of a trial skin substitute called Vendaje today. 12/23/2021: The wound on his right calcaneus is nearly closed; there is just a small area at the most distal aspect of the calcaneus that is open. On the left, the area where we applied [name] the skin substitute has a healthier-looking bed of granulation tissue. The wound dimensions are not significantly different on this side but the wound surface is improved. 12/30/2021: The wound on the right calcaneus has not changed significantly aside from some accumulation of callus. On the left, the open area is smaller and continues [name] have an improved surface. He continues [name] accumulate callus around the wound. He is here for his third application of Vendaje. 01/06/2022: The right calcaneal wound is down [name] just a couple of millimeters. He continues [name] accumulate periwound callus. He unfortunately got his cast wet earlier in the week and his left foot is macerated, resulting in some superficial skin loss just distal [name] the open wound. The open wound itself, however, is much smaller and has a healthier appearing surface. He is here for his fourth application of Vendaje. 01/13/2022: The right calcaneal wound is about the same. Unfortunately, once again, his cast got wet and his foot is again macerated. This is caused the left calcaneal wound [name] enlarge. He is here for his fifth application of  Vendaje. 01/20/2022: The right calcaneal wound is very small. There is some periwound callus accumulation. He purchased a new cast protector last week and this has been effective in avoiding the maceration that has been occurring on the left. The left calcaneal wound is narrower and has a healthy and viable-appearing surface. He is here for his 6 application of Vendaje. 01/27/2022: The right calcaneal wound is down [name] just a pinhole. There is some periwound slough and callus. On the left, the wound is narrower and shorter by about a centimeter. The surface is robust and viable-appearing. Unfortunately, the rep for the trial skin substitute product did not provide any for [place] [name] use today. 02/04/2022: The right calcaneal wound remains unchanged. There is more accumulated callus. On the left, although the intake nurse measured it a little bit longer, it looks about the same [name] me. The surface has a layer of slough, but underneath this, there is good granulation tissue. 02/10/2022: The right calcaneus wound is nearly closed. There is still some callus that builds up around the site. The left side looks about the same in terms of dimensions, but the surface is more robust and vital-appearing. 02/16/2022: The area of the right calcaneus that was nearly closed last week has closed, but there is a small opening at the mid foot where it looks like some moisture got retained and caused some reopening. The left foot wound is narrower and shallower. Both sites have a fair amount of periwound callus and eschar. 02/24/2022: The small midfoot opening on the right calcaneus is a little bit smaller today. The left foot wound is narrower and shallower. He continues [name] accumulate periwound callus. No concern for infection. 03/01/2022: The patient came [name] clinic early because he showered and got his cast wet. Fortunately, there is no significant maceration [name] his foot but the callus softened and it looks like the wound on his  left calcaneus may be a little bit wider. The  wound on his right calcaneus is just a narrow slit. Continued accumulation of periwound callus bilaterally. 03/08/2022: The wound on his right calcaneus is very nearly closed, just a small pinpoint opening under a bit of eschar; the left wound has come in quite a bit, as well. It is narrower and shorter than at our last visit. Still with accumulated callus and eschar bilaterally. 03/17/2022: The right calcaneal wound is healed. The left wound is smaller and the surface itself is very clean, but there is some blue-green staining on the [name], [name] ([id number]) [phone].pdf Page 12 of 16 periwound callus, concerning for Pseudomonas aeruginosa. 03/23/2022: The right calcaneal wound remains closed. The left wound continues [name] contract. No further blue-green staining. Small amount of callus and slough accumulation. 03/28/2022: He came in early today because he had gotten his cast wet. On inspection, the wound itself did not get wet or macerated, just a little bit of the forefoot. The wound itself is basically unchanged. 04/07/2022: The right foot wound remains closed. The left wound is the smallest that I have seen it [name] date. It is narrower and shorter. It still continues [name] accumulate slough on the surface. 04/15/2022: There is a band of epithelium now dividing the small left plantar foot wound in 2. There is still some slough on the surface. 04/21/2022: The wound continues [name] narrow. Just a little bit of slough on the surface. He seems [name] be responding well [name] endoform. 04/28/2022: Continued slow contraction of the wound. There is a little slough on the surface and some periwound callus. We have been using endoform and total contact cast. 05/05/2022: The wound appears [name] have stalled. There is slough and some periwound eschar/callus. No concern for infection, however. 05/12/2022: Unfortunately, his right foot has reopened. It is located  at the most posterior aspect of his surgical incision. The area was noted [name] have drainage coming from it when his padding was removed today. Underneath some callus and senescent skin, there is an opening. No purulent drainage or malodor. On the left foot, the wound is again unchanged. There is some light blue staining on the callus, but no malodor or purulent drainage. 10/13; right and left heel remanence of extensive plantar foot wounds. These are better than I remember by quite a big margin however he is still left with wounds on the left plantar heel and the right plantar heel. Been using endoform bilaterally. A culture was done that showed apparently Pseudomonas but we are still waiting for the [facility] antibiotic [name] use gentamicin today. He is still very active by description I am not sure about the offloading of his noncasted right foot 10/20; both wounds right and left heel debrided not much change from last week. [name] Echevaria has arrived which is linezolid, gentamicin and ciprofloxacin we will use this with endoform. T contact cast on the left otal 06/02/2022: Both wounds are smaller today. There is still a fair amount of callus buildup around the right foot ulcer. The left is more superficial and nearly flush with the surrounding tissues. Also with slough and eschar buildup. 06/10/2022: The right sided wound appears [name] be nearly closed, if not completely so, although it is somewhat difficult [name] tell given the abnormal tissue and scarring in his foot. There is a fair amount of callus and crust accumulation. On the left, the wound looks about the same, again with callus and slough. He has an appointment next Thursday with Dr. Annamary Rummage in podiatry; I am hopeful that there may  be some reconstructive options available for Mr. Meuse. 06/16/2022: Both wounds have some eschar and callus accumulation. The right sided wound is extremely narrow and barely open; the left is narrower than last week. There  is a little bit of slough. He has his appointment in podiatry later today. 06/23/2022: The patient met with Dr. Annamary Rummage last week and unfortunately, there are no reconstructive options that he believes would be helpful. He did order an MRI [name] evaluate for osteomyelitis and fortunately, none was seen. The left sided wound is a little bit shorter and narrower today. The right sided wound is about the same. There is callus and eschar accumulation bilaterally. 06/29/2022: Both feet have improved from last week. There is epithelium making a valiant effort [name] creep across the surface on the left. The right side looks like it got a little dry and the deep crevasse in his midfoot has cracked. Both have eschar and there is some slough on the left. 07/07/2022: Both feet have improved. There is epithelium completely covering the calcaneus on the right with just a small opening in the crevasse in his midfoot. On the left, the open area of tissue is smaller but he continues [name] build up callus/eschar and slough. 07/15/2022: The opening in the midfoot on the right is about the same size, covered with eschar and a little bit of slough. The open portion of the left wound is narrower and shorter with a bit of slough buildup. He admits [name] being on his feet more than recommended. 12/14; as far as I can tell everything on the right foot is closed. There is some eschar I removed some of this I cannot identify any open wound here. As usual this will be a very vulnerable area going forward. On the left this looks really quite healthy. I was pleasantly surprised [name] see how good this looked. Wound is certainly smaller and there appears [name] be healthy epithelialization. He has been using Promogran on the right and endoform on the left. He has been offloading the right foot with a heel offloading boot and he has a running shoe on the right foot 08/04/2022: The right foot remains closed. He has a thick cushioned insole in his  sneaker. The left sided wound is smaller with just some slough and eschar accumulation. He is wearing the heel off loader on this foot. 08/15/2022: The right foot remains closed. The left wound has narrowed further. There is some slough and eschar accumulation. 08/25/2022: We put him in a peg assist shoe insert and as a result, he has more epithelialization of the ulcer with minimal slough and eschar accumulation. 09/01/2022: The wound is smaller by about half this week. Still with some slough on the surface. The peg assist shoe seems [name] be doing a remarkable job of adequately offloading the site. 09/08/2022: There is a little bit more epithelium coming in. There is some slough and callus buildup. 09/16/2022: The wound measures about the same size, but the epithelium that has grown in looks more robust and stronger. There is some slough and callus buildup. 09/23/2022: The wound remains about the same size. The skin edges are looking rather senescent. 09/29/2022: The aggressive debridement I performed last week seems [name] have been effective. The wound is smaller and has significantly less slough accumulation. 10/06/2022: There has been quite a bit of epithelialization since last week. There are still some open areas with slough accumulation. There is some callus buildup around the perimeter. 10/13/2022: Continued improvement. Minimal callus  accumulation. 3/14; patient presents for follow-up. He has been using endoform [name] the wound bed without issues. He is using a surgical shoe with peg assist for offloading. 10/27/2022: The wound dimensions are stable. There is some senescent skin accumulation around the perimeter. 11/04/2022: Last week I performed a very aggressive debridement in an effort [name] stimulate the healing cascade. As has been the case when I have done this before, the wound has responded well. There has been epithelialization and contraction of the wound. There are just a couple of small open  areas. [name], [name] ([id number]) [phone].pdf Page 13 of 16 11/10/2022: Unfortunately, his foot got wet secondary [name] sweating and there has been some breakdown of the tissue, particularly the skin just adjacent [name] the main ulcer. 11/18/2022: We continue [name] struggle with moisture-related tissue breakdown around the perimeter of his wound. This has caused the thin epithelium that had formed on the surface [name] disintegrate. The underlying surface of the wound has good granulation tissue. 11/25/2022: The edges of the wound are much less macerated, but the surface is actually looking a little bit dry. There is slough accumulation. No obvious signs of infection. 12/01/2022: The wound edges are more macerated and broken down, making the wound larger. The surface has some slough on it. 12/08/2020: The wound looks better this week. There is significantly less maceration and moisture-related tissue breakdown. There is some slough on the wound surface. 12/15/2022: The wound continues [name] improve. There is almost no moisture-related tissue breakdown. There is epithelium beginning [name] fill in again from the edges. Light slough on the wound surface. 12/22/2022: More epithelium has filled him from around the edges. There is some slough on the surface. No evidence of moisture-related tissue breakdown. 01/05/2023: There has been more epithelialization, particularly at the posterior calcaneal aspect of the wound. There has been a little bit of moisture accumulation with minimal maceration. 01/12/2023: More epithelium has filled in. There is very minimal accumulation of slough. 01/20/2023: The wound is measuring a little bit smaller today. It did measure deeper, but this appears [name] be secondary [name] some heaped up senescent skin around the edges. The epithelium that has been present at the posterior aspect of the wound seems [name] be a bit more robust with greater integrity. 01/26/2023: He reports intense  itching [name] the skin around the wound and there has been some breakdown here. It looks fungal in nature. The wound itself looks pretty good with improved epithelialization with just some slough buildup. He has a little callus along the wound edges. 02/02/2023: He has been treating the periwound skin with an over-the-counter athlete's foot medication and notes significant improvement in his pruritus. The skin looks better here, as well. The wound continues [name] epithelialize. There is light slough accumulation. 02/10/2023: The periwound skin still looks fairly angry. He reports multiple small blisters that have opened up. He is not having the intense itching that he had prior, however. There has been more epithelialization at the calcaneus and there is minimal slough accumulation. 02/16/2023: The periwound skin is improving. An online review of dermatology rash images suggest that this may be hand-foot-and-mouth disease, isolated [name] the feet; the patient says he actually had this as a child. The calcaneus continues [name] epithelialize and the tissue was quite robust here. Very minimal slough accumulation on the open portion of the wound with a little bit of periwound callus buildup. 03/03/2023: The wound measurements are about the same, but there is more epithelium making its way [name] the  surface. There is a little bit of crust and eschar around the edges and minimal slough on the surface. 03/09/2023: His dressing was applied rather haphazardly and the drawtex was not applied. The Optifoam was also reversed such that the orange side was in contact with his bare skin. He has some maceration around the wound edges, but this did not appear [name] result in any tissue breakdown. There is a little bit of slough on the surface and minimal eschar around the edges. 03/15/2023: The wound measured narrower today. There is some slough accumulation on the surface and some eschar around the edges. 03/23/2023: The length of the wound  remains stable, but it continues [name] narrow. The skin that has covered the posterior aspect of the calcaneus is more robust and has better tensile integrity. Minimal slough and eschar accumulation. 03/30/2023: The wound length has decreased. This seems [name] be primarily due [name] more epithelium covering the posterior aspect of the site. Minimal slough and eschar buildup. 04/07/2023: The wound measured a little bit smaller again today. There is minimal slough accumulation with just a little bit of eschar and callus around the edges. 04/13/2023: The lateral aspect of his wound is starting [name] epithelialize. There is very minimal slough on the surface. Moisture control is good. 04/20/2023: Continued, albeit extremely slow, encroachment of epithelium as of the wound surface. Minimal slough. Moisture balance is good. 04/27/2023: More epithelium is filling in from the heel area. There is a little bit of slough on the surface with some periwound callus and eschar. He did note a little bit of drainage coming from his right foot and there is a small crack in the skin extending medially from the main site of his prior ulceration. 05/04/2023: The wounds are stable this week. No significant change. There is a little bit of callus buildup around the edges of each site and some thin slough on the surface on the left foot. 05/11/2023: The skin on the right heel is filling in further. There is a little bit of slough accumulation on both surfaces along with periwound callus accumulation. 05/18/2023: Both wounds are smaller today. The right foot is down [name] just a sliver of opening. 05/25/2023: Epithelium continues [name] advance from the calcaneal area towards the midfoot on the left. On the right, there is tiniest crack of an opening that remains. 06/01/2023: No significant change bilaterally. He has built up quite a bit of callus on the calcaneal surface of the right foot. 06/15/2023: He missed his appointment last week. The wounds  measured larger today. There is some slough and eschar accumulation on both wounds. He was discussed in the [facility] wound camp last week. The consensus was in favor of proceeding with plastic surgery intervention but they also suggested repeating the MRI imaging of his heels [name] ensure no osteomyelitis present. 06/22/2023: The wounds are basically stable. His MRI is scheduled for tomorrow and his surgery for a week from tomorrow, 22 November. 12/9; patient I know from some months ago. 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 He saw Dr. [name] aylor from plastic surgery who is planning surgery. He is received his okay from pulmonology and he sees cardiology on Wednesday. He has been using silver alginate [name] both wound areas. The area on the left heel actually looks fairly good healthy looking granulation some epithelialization towards the tip of the heel. Right heel is almost completely eschared. [name], [name] ([id number]) [phone].pdf Page 14 of 16 Objective Constitutional Sitting  or standing Blood Pressure is within target range for patient.. Pulse regular and within target range for patient.Marland Kitchen Respirations regular, non-labored and within target range.. Temperature is normal and within the target range for the patient.Marland Kitchen Appears in no distress. Vitals Time Taken: 9:00 AM, Height: 69 in, Weight: 280 lbs, BMI: 41.3, Temperature: 98.2 F, Pulse: 80 bpm, Respiratory Rate: 16 breaths/min, Blood Pressure: 141/74 mmHg. General Notes: Wound exam; large amount of eschar over the entirety of the right plantar heel extending towards the right midfoot. I removed this with a #5 curette. No debridement on the left which actually appears [name] have some decent looking granulation. No exposed bone in either area. Integumentary (Hair, Skin) Wound #2 status is Open. Original cause of wound was Pressure Injury. The date acquired was: 10/07/2019. The wound has been  in treatment 156 weeks. The wound is located on the Left Calcaneus. The wound measures 2.8cm length x 1.2cm width x 0.1cm depth; 2.639cm^2 area and 0.264cm^3 volume. There is Fat Layer (Subcutaneous Tissue) exposed. There is no tunneling or undermining noted. There is a medium amount of serosanguineous drainage noted. The wound margin is distinct with the outline attached [name] the wound base. There is large (67-100%) pink granulation within the wound bed. There is a small (1-33%) amount of necrotic tissue within the wound bed including Eschar and Adherent Slough. The periwound skin appearance had no abnormalities noted for moisture. The periwound skin appearance had no abnormalities noted for color. The periwound skin appearance exhibited: Callus, Scarring. The periwound skin appearance did not exhibit: Crepitus, Excoriation, Induration, Rash. Periwound temperature was noted as No Abnormality. The periwound has tenderness on palpation. Wound #5 status is Open. Original cause of wound was Gradually Appeared. The date acquired was: 04/27/2023. The wound has been in treatment 11 weeks. The wound is located on the Right,Plantar Foot. The wound measures 1cm length x 0.4cm width x 0.2cm depth; 0.314cm^2 area and 0.063cm^3 volume. There is Fat Layer (Subcutaneous Tissue) exposed. There is no tunneling or undermining noted. There is a medium amount of serosanguineous drainage noted. The wound margin is distinct with the outline attached [name] the wound base. There is large (67-100%) red granulation within the wound bed. There is a small (1-33%) amount of necrotic tissue within the wound bed including Eschar and Adherent Slough. The periwound skin appearance had no abnormalities noted for color. The periwound skin appearance exhibited: Callus, Scarring, Dry/Scaly. The periwound skin appearance did not exhibit: Rash. Periwound temperature was noted as No Abnormality. The periwound has tenderness on  palpation. Assessment Active Problems ICD-10 Non-pressure chronic ulcer of other part of right foot with fat layer exposed Non-pressure chronic ulcer of other part of left foot with fat layer exposed Procedures Wound #5 Pre-procedure diagnosis of Wound #5 is a Pressure Ulcer located on the Right,Plantar Foot . There was a Selective/Open Wound Skin/Epidermis Debridement with a total area of 0.31 sq cm performed by Maxwell Caul., MD. With the following instrument(s): Curette [name] remove Viable and Non-Viable tissue/material. Material removed includes Eschar, Skin: Dermis, and Skin: Epidermis after achieving pain control using Lidocaine 5% topical ointment. No specimens were taken. A time out was conducted at 09:40, prior [name] the start of the procedure. A Minimum amount of bleeding was controlled with Pressure. The procedure was tolerated well with a pain level of 0 throughout and a pain level of 0 following the procedure. Post Debridement Measurements: 1cm length x 0.4cm width x 0.2cm depth; 0.063cm^3 volume. Post debridement Stage noted as  Category/Stage II. Character of Wound/Ulcer Post Debridement is improved. Post procedure Diagnosis Wound #5: Same as Pre-Procedure Plan Follow-up Appointments: Return Appointment in 1 week. - Dr. Lady Gary Return Appointment in: - ++++call [name] schedule appointment if needed after plastic surgery Anesthetic: Wound #2 Left Calcaneus: (In clinic) Topical Lidocaine 4% applied [name] wound bed - In clinic Bathing/ Shower/ Hygiene: May shower and wash wound with soap and water. Off-Loading: Other: - keep pressure off of the bottom of your feet. Elevate legs throughout the day. Use the Shoe with the PegAssist off-loading insole Additional Orders / Instructions: Follow Nutritious Diet - Try [name] get 70-100 grams of Protein a day+ Coupons for Juven sent with patient. Other: - 500 mg per day [name] start, then increase [name] 500mg  twice a day. Also consider adding a zinc  supplement. Juven Shake 1-2 times daily. WOUND #2: - Calcaneus Wound Laterality: Left Cleanser: Normal Saline (Generic) 1 x Per Day/Other:until healed Discharge Instructions: Cleanse the wound with Normal Saline prior [name] applying a clean dressing using gauze sponges, not tissue or cotton balls. Cleanser: Wound Cleanser (Generic) 1 x Per Day/Other:until healed [name], [name] ([id number]) [id number].pdf Page 15 of 16 Discharge Instructions: Cleanse the wound with wound cleanser prior [name] applying a clean dressing using gauze sponges, not tissue or cotton balls. Peri-Wound Care: Ketoconazole Cream 2% (Generic) 1 x Per Day/Other:until healed Discharge Instructions: Apply Ketoconazole mixed with zinc [name] the periwound Peri-Wound Care: Zinc Oxide Ointment 30g tube (Generic) 1 x Per Day/Other:until healed Discharge Instructions: Apply Zinc Oxide [name] periwound with each dressing change Topical: Gentamicin (Generic) 1 x Per Day/Other:until healed Discharge Instructions: apply [name] the wound bed Topical: Ketoconazole Cream 2% 1 x Per Day/Other:until healed Discharge Instructions: Apply Ketoconazole [name] periwound Prim Dressing: Maxorb Extra Ag+ Alginate Dressing, 4x4.75 (in/in) (Generic) 1 x Per Day/Other:until healed ary Discharge Instructions: Apply [name] wound bed as instructed Secondary Dressing: Drawtex 4x4 in (Generic) 1 x Per Day/Other:until healed Discharge Instructions: Apply over primary dressing as directed. Secondary Dressing: Optifoam Non-Adhesive Dressing, 4x4 in (Generic) 1 x Per Day/Other:until healed Discharge Instructions: As needed- Apply over primary dressing as directed. Secondary Dressing: Woven Gauze Sponge, Non-Sterile 4x4 in (Generic) 1 x Per Day/Other:until healed Discharge Instructions: Apply over primary dressing as directed. Secured With: 53M Medipore H Soft Cloth Surgical T ape, 4 x 10 (in/yd) (Generic) 1 x Per Day/Other:until healed Discharge Instructions:  Secure with tape as directed. Com pression Wrap: Kerlix Roll 4.5x3.1 (in/yd) (Generic) 1 x Per Day/Other:until healed Discharge Instructions: Apply Kerlix and Coban compression as directed. Add-Ons: [employer] Offloading Boot, Size Regular 1 x Per Day/Other:until healed WOUND #5: - Foot Wound Laterality: Plantar, Right Peri-Wound Care: Ketoconazole Cream 2% 1 x Per Day/30 Days Discharge Instructions: Apply Ketoconazole [name] periwound as needed Peri-Wound Care: Zinc Oxide Ointment 30g tube 1 x Per Day/30 Days Discharge Instructions: Apply Zinc Oxide [name] periwound with each dressing change Topical: Gentamicin 1 x Per Day/30 Days Discharge Instructions: As directed by physician Prim Dressing: Maxorb Extra Ag+ Alginate Dressing, 4x4.75 (in/in) 1 x Per Day/30 Days ary Discharge Instructions: Apply [name] wound bed as instructed Secondary Dressing: Drawtex 4x4 in 1 x Per Day/30 Days Discharge Instructions: As needed -Apply over primary dressing as directed. Secondary Dressing: Optifoam Non-Adhesive Dressing, 4x4 in 1 x Per Day/30 Days Discharge Instructions: Apply over primary dressing as directed. Secured With: [employer], 4.5x3.1 (in/yd) 1 x Per Day/30 Days Discharge Instructions: Secure with Kerlix as directed. 1. Continued with the silver alginate based dressings  2. Making plans for plastic surgery Dr. Ladona Ridgel. I wonder what the plans are for offloading the surgery that is done. Electronic Signature(s) Signed: 07/17/2023 4:43:54 PM By: Baltazar Najjar MD Entered By: Baltazar Najjar on 07/17/2023 10:27:22 -------------------------------------------------------------------------------- SuperBill Details Patient Name: Date of Service: [name], [name] [name] 07/17/2023 Medical Record Number: [id number] Patient Account Number: [id number] Date of Birth/Sex: Treating RN: 10/04/1975 (47 y.o. M) Primary Care Provider: Dorinda Hill Other Clinician: Referring Provider: Treating Provider/Extender:  [place] Dec in Treatment: 156 Diagnosis Coding ICD-10 Codes Code Description [phone] Non-pressure chronic ulcer of other part of right foot with fat layer exposed L97.522 Non-pressure chronic ulcer of other part of left foot with fat layer exposed Facility Procedures : CPT4 Code: [id number] Description: 97597 - DEBRIDE WOUND 1ST 20 SQ CM OR < ICD-10 Diagnosis Description L97.512 Non-pressure chronic ulcer of other part of right foot with fat layer exposed Modifier: Quantity: 1 Physician Procedures TATSUMI, DOWTY ([id number]): CPT4 Code Description 3329518 [id number] - WC PHYS DEBR WO ANESTH 20 SQ CM ICD-10 Diagnosis Description L97.512 Non-pressure chronic ulcer of other part of right foot with fat [id number].pdf Page 16 of 16: Quantity Modifier 1 layer exposed Electronic Signature(s) Signed: 07/17/2023 4:43:54 PM By: Baltazar Najjar MD Entered By: Baltazar Najjar on 07/17/2023 10:27:41

## 2023-07-17 NOTE — Progress Notes (Signed)
KUNJ, SAFRIT ([id number]) [id number] Page 1 of 9 Visit Report for 07/17/2023 Arrival Information Details Patient Name: Date of Service: [name], TO [name] 07/17/2023 9:00 A M Medical Record Number: [id number] Patient Account Number: [id number] Date of Birth/Sex: Treating RN: [birth year] (47 y.o. [name] Primary Care [name]: [name] Other Clinician: Referring Shacola Schussler: Treating Mirtha Jain/Extender: Huntley Dec in Treatment: 156 Visit Information History Since Last Visit Added or deleted any medications: No Patient Arrived: Wheel Chair Any new allergies or adverse reactions: No Arrival Time: 09:00 Had a fall or experienced change in No Accompanied By: wife and child activities of daily living that may affect Transfer Assistance: None risk of falls: Patient Requires Transmission-Based Precautions: No Signs or symptoms of abuse/neglect since last visito No Patient Has Alerts: No Hospitalized since last visit: No Implantable device outside of the clinic excluding No cellular tissue based products placed in the center since last visit: Has Dressing in Place as Prescribed: Yes Pain Present Now: No Electronic Signature(s) Signed: 07/17/2023 4:42:52 PM By: [name] RN Entered By: [name] on 07/17/2023 09:05:33 -------------------------------------------------------------------------------- Encounter Discharge Information Details Patient Name: Date of Service: [name], [name] 07/17/2023 9:00 A M Medical Record Number: [id number] Patient Account Number: [id number] Date of Birth/Sex: Treating RN: Feb 03, 1976 (47 y.o. [name] Primary Care [name] [name]: [name] Other Clinician: Referring Naziah Portee: Treating Freddy Spadafora/Extender: Huntley Dec in Treatment: 156 Encounter Discharge Information Items Post Procedure Vitals Discharge Condition: Stable Temperature (F): 98.2 Ambulatory  Status: Wheelchair Pulse (bpm): 80 Discharge Destination: Home Respiratory Rate (breaths/min): 16 Transportation: Private Auto Blood Pressure (mmHg): 141/74 Accompanied By: Wife and child Schedule Follow-up Appointment: Yes Clinical Summary of Care: Patient Declined Electronic Signature(s) Signed: 07/17/2023 4:42:52 PM By: [name] RN Entered By: [name] on 07/17/2023 10:24:46 [name] ([id number]) [id number] Page 2 of 9 -------------------------------------------------------------------------------- Lower Extremity Assessment Details Patient Name: Date of Service: [name], TO [name] 07/17/2023 9:00 A M Medical Record Number: [id number] Patient Account Number: [id number] Date of Birth/Sex: Treating RN: 1975/11/21 (47 y.o. [name] Primary Care Yaseen Gilberg: [name] Other Clinician: Referring Tonette Koehne: Treating Markcus Lazenby/Extender: Huntley Dec in Treatment: 156 Edema Assessment Assessed: [name]: No] [name]: No] Edema: [Left: No] [Right: No] Calf Left: Right: Point of Measurement: 29 cm From Medial Instep 43 cm 43.2 cm Ankle Left: Right: Point of Measurement: 9 cm From Medial Instep 24.4 cm 24.5 cm Vascular Assessment Pulses: Dorsalis Pedis Palpable: [Left:Yes] [Right:Yes] Extremity colors, hair growth, and conditions: Extremity Color: [Left:Normal] Hair Growth on Extremity: [Left:Yes] Temperature of Extremity: [Left:Warm] Capillary Refill: [Left:< 3 seconds] [Right:< 3 seconds] Dependent Rubor: [Left:No No] Electronic Signature(s) Signed: 07/17/2023 4:42:52 PM By: [name] RN Entered By: [name] on 07/17/2023 09:18:19 -------------------------------------------------------------------------------- Multi Wound Chart Details Patient Name: Date of Service: [name], [name] 07/17/2023 9:00 A M Medical Record Number: [id number] Patient Account Number: [id number] Date of Birth/Sex:  Treating RN: [birth year] (47 y.o. M) Primary Care Loren Sawaya: [name] Other Clinician: Referring Barbar Brede: Treating Siddhi Dornbush/Extender: Huntley Dec in Treatment: 156 Vital Signs Height(in): 69 Pulse(bpm): 80 Weight(lbs): 280 Blood Pressure(mmHg): 141/74 Body Mass Index(BMI): 41.3 Temperature(F): 98.2 Respiratory Rate(breaths/min): 16 [2:Photos:] [N/A:N/A [id number] Page 3 of 9] Left Calcaneus Right, Plantar Foot N/A Wound Location: Pressure Injury Gradually Appeared N/A Wounding Event: Pressure Ulcer Pressure Ulcer N/A Primary Etiology: Asthma, Angina, Hypertension Asthma, Angina, Hypertension N/A Comorbid History: 10/07/2019 04/27/2023 N/A Date Acquired: 156 11 N/A Weeks of  Treatment: Open Open N/A Wound Status: No No N/A Wound Recurrence: 2.8x1.2x0.1 1x0.4x0.2 N/A Measurements L x W x D (cm) 2.639 0.314 N/A A (cm) : rea 0.264 0.063 N/A Volume (cm) : 90.30% -912.90% N/A % Reduction in A rea: 99.00% -2000.00% N/A % Reduction in Volume: Category/Stage III Category/Stage II N/A Classification: Medium Medium N/A Exudate A mount: Serosanguineous Serosanguineous N/A Exudate Type: red, brown red, brown N/A Exudate Color: Distinct, outline attached Distinct, outline attached N/A Wound Margin: Large (67-100%) Large (67-100%) N/A Granulation A mount: Pink Red N/A Granulation Quality: Small (1-33%) Small (1-33%) N/A Necrotic A mount: Eschar, Adherent Slough Eschar, Adherent Slough N/A Necrotic Tissue: Fat Layer (Subcutaneous Tissue): Yes Fat Layer (Subcutaneous Tissue): Yes N/A Exposed Structures: Fascia: No Tendon: No Muscle: No Joint: No Bone: No Small (1-33%) Small (1-33%) N/A Epithelialization: N/A Debridement - Selective/Open Wound N/A Debridement: Pre-procedure Verification/Time Out N/A 09:40 N/A Taken: N/A Lidocaine 5% topical ointment N/A Pain Control: N/A Necrotic/Eschar N/A Tissue Debrided: N/A  Skin/Epidermis N/A Level: N/A 0.31 N/A Debridement A (sq cm): rea N/A Curette N/A Instrument: N/A Minimum N/A Bleeding: N/A Pressure N/A Hemostasis A chieved: N/A 0 N/A Procedural Pain: N/A 0 N/A Post Procedural Pain: N/A Procedure was tolerated well N/A Debridement Treatment Response: N/A 1x0.4x0.2 N/A Post Debridement Measurements L x W x D (cm) N/A 0.063 N/A Post Debridement Volume: (cm) N/A Category/Stage II N/A Post Debridement Stage: Callus: Yes Callus: Yes N/A Periwound Skin Texture: Scarring: Yes Scarring: Yes Excoriation: No Rash: No Induration: No Crepitus: No Rash: No Dry/Scaly: Yes Dry/Scaly: Yes N/A Periwound Skin Moisture: Maceration: No Atrophie Blanche: No Hemosiderin Staining: No N/A Periwound Skin Color: Cyanosis: No Ecchymosis: No Erythema: No Hemosiderin Staining: No Mottled: No Pallor: No Rubor: No No Abnormality No Abnormality N/A Temperature: Yes Yes N/A Tenderness on Palpation: N/A Debridement N/A Procedures Performed: Treatment Notes Electronic Signature(s) Signed: 07/17/2023 4:43:54 PM By: Baltazar Najjar MD Entered By: Baltazar Najjar on 07/17/2023 10:23:19 [name] ([id number]) [id number] Page 4 of 9 -------------------------------------------------------------------------------- Multi-Disciplinary Care Plan Details Patient Name: Date of Service: [name], TO [name] 07/17/2023 9:00 A M Medical Record Number: [id number] Patient Account Number: [id number] Date of Birth/Sex: Treating RN: 1976/07/01 (47 y.o. [name] Primary Care Milinda Sweeney: [name] Other Clinician: Referring Liba Hulsey: Treating Sevan Mcbroom/Extender: Huntley Dec in Treatment: 156 Multidisciplinary Care Plan reviewed with physician Active Inactive Wound/Skin Impairment Nursing Diagnoses: Impaired tissue integrity Knowledge deficit related to ulceration/compromised skin  integrity Goals: Patient/caregiver will verbalize understanding of skin care regimen Date Initiated: 07/15/2020 Target Resolution Date: 10/06/2023 Goal Status: Active Ulcer/skin breakdown will have a volume reduction of 30% by week 4 Date Initiated: 07/15/2020 Date Inactivated: 08/20/2020 Target Resolution Date: 09/03/2020 Goal Status: Unmet Unmet Reason: no major changes. Ulcer/skin breakdown will heal within 14 weeks Date Initiated: 12/04/2020 Date Inactivated: 12/10/2020 Target Resolution Date: 12/10/2020 Unmet Reason: wounds still open at 14 Goal Status: Unmet weeks and today 21 weeks. Interventions: Assess patient/caregiver ability to obtain necessary supplies Assess patient/caregiver ability to perform ulcer/skin care regimen upon admission and as needed Assess ulceration(s) every visit Provide education on ulcer and skin care Treatment Activities: Skin care regimen initiated : 07/15/2020 Topical wound management initiated : 07/15/2020 Notes: Electronic Signature(s) Signed: 07/17/2023 4:42:52 PM By: [name] RN Entered By: [name] on 07/17/2023 10:22:30 -------------------------------------------------------------------------------- Pain Assessment Details Patient Name: Date of Service: [name], [name] 07/17/2023 9:00 A M Medical Record Number: [id number] Patient Account Number: [id number] Date of Birth/Sex: Treating RN: 1976/05/18 (  47 y.o. [name] Primary Care Yilin Weedon: [name] Other Clinician: Referring Kindred Heying: Treating Razia Screws/Extender: Huntley Dec in Treatment: 156 Active Problems Location of Pain Severity and Description of Pain Patient Has [name], [name] ([id number]) [id number] Page 5 of 9 Patient Has Paino Yes Site Locations Pain Location: Generalized Pain With Dressing Change: Yes Duration of the Pain. Constant / Intermittento Constant Rate the pain. Current Pain Level:  4 Worst Pain Level: 10 Least Pain Level: 3 Tolerable Pain Level: 3 Character of Pain Describe the Pain: Difficult to Pinpoint Pain Management and Medication Current Pain Management: Medication: Yes Cold Application: No Rest: Yes Massage: No Activity: No T.E.N.S.: No Heat Application: No Leg drop or elevation: No Is the Current Pain Management Adequate: Adequate How does your wound impact your activities of daily livingo Sleep: No Bathing: No Appetite: No Relationship With Others: No Bladder Continence: No Emotions: No Bowel Continence: No Work: No Toileting: No Drive: No Dressing: No Hobbies: No Electronic Signature(s) Signed: 07/17/2023 4:42:52 PM By: [name] RN Entered By: [name] on 07/17/2023 09:16:26 -------------------------------------------------------------------------------- Patient/Caregiver Education Details Patient Name: Date of Service: [name], TO [place] 12/9/2024andnbsp9:00 A M Medical Record Number: [id number] Patient Account Number: [id number] Date of Birth/Gender: Treating RN: 03/10/1976 (47 y.o. [name] Primary Care Physician: [name] Other Clinician: Referring Physician: Treating Physician/Extender: Huntley Dec in Treatment: 156 Education Assessment Education Provided To: Patient Education Topics Provided Wound/Skin Impairment: Methods: Explain/Verbal Responses: State content correctly [name], [name] ([id number]) [id number] Page 6 of 9 Signed: 07/17/2023 4:42:52 PM By: [name] RN Entered By: [name] on 07/17/2023 10:22:49 -------------------------------------------------------------------------------- Wound Assessment Details Patient Name: Date of Service: [name], TO [name] 07/17/2023 9:00 A M Medical Record Number: [id number] Patient Account Number: [id number] Date of Birth/Sex: Treating RN: 23-Sep-1975 (47 y.o. [name] Primary Care [name] Loy: [name] Other Clinician: Referring Maxson Oddo: Treating Keanu Lesniak/Extender: Huntley Dec in Treatment: 156 Wound Status Wound Number: 2 Primary Etiology: Pressure Ulcer Wound Location: Left Calcaneus Wound Status: Open Wounding Event: Pressure Injury Comorbid History: Asthma, Angina, Hypertension Date Acquired: 10/07/2019 Weeks Of Treatment: 156 Clustered Wound: No Photos Wound Measurements Length: (cm) 2.8 Width: (cm) 1.2 Depth: (cm) 0.1 Area: (cm) 2.639 Volume: (cm) 0.264 % Reduction in Area: 90.3% % Reduction in Volume: 99% Epithelialization: Small (1-33%) Tunneling: No Undermining: No Wound Description Classification: Category/Stage III Wound Margin: Distinct, outline attached Exudate Amount: Medium Exudate Type: Serosanguineous Exudate Color: red, brown Foul Odor After Cleansing: No Slough/Fibrino No Wound Bed Granulation Amount: Large (67-100%) Exposed Structure Granulation Quality: Pink Fascia Exposed: No Necrotic Amount: Small (1-33%) Fat Layer (Subcutaneous Tissue) Exposed: Yes Necrotic Quality: Eschar, Adherent Slough Tendon Exposed: No Muscle Exposed: No Joint Exposed: No Bone Exposed: No Periwound Skin Texture Texture Color No Abnormalities Noted: No No Abnormalities Noted: Yes Callus: Yes Temperature / Pain Crepitus: No Temperature: No Abnormality Excoriation: No Tenderness on Palpation: Yes Induration: No Rash: No ScarringMARRELL, [name] ([id number]) [id number] Page 7 of 9 Moisture No Abnormalities Noted: Yes Treatment Notes Wound #2 (Calcaneus) Wound Laterality: Left Cleanser Normal Saline Discharge Instruction: Cleanse the wound with Normal Saline prior to applying a clean dressing using gauze sponges, not tissue or cotton balls. Wound Cleanser Discharge Instruction: Cleanse the wound with wound cleanser prior to applying a clean dressing using  gauze sponges, not tissue or cotton balls. Peri-Wound Care Ketoconazole Cream 2% Discharge Instruction: Apply Ketoconazole mixed with zinc  to the periwound Zinc Oxide Ointment 30g tube Discharge Instruction: Apply Zinc Oxide to periwound with each dressing change Topical Gentamicin Discharge Instruction: apply to the wound bed Ketoconazole Cream 2% Discharge Instruction: Apply Ketoconazole to periwound Primary Dressing Maxorb Extra Ag+ Alginate Dressing, 4x4.75 (in/in) Discharge Instruction: Apply to wound bed as instructed Secondary Dressing Drawtex 4x4 in Discharge Instruction: Apply over primary dressing as directed. Optifoam Non-Adhesive Dressing, 4x4 in Discharge Instruction: As needed- Apply over primary dressing as directed. Woven Gauze Sponge, Non-Sterile 4x4 in Discharge Instruction: Apply over primary dressing as directed. Secured With 68M Medipore H Soft Cloth Surgical T ape, 4 x 10 (in/yd) Discharge Instruction: Secure with tape as directed. Compression Wrap Kerlix Roll 4.5x3.1 (in/yd) Discharge Instruction: Apply Kerlix and Coban compression as directed. Compression Stockings Add-Ons [employer] Offloading Boot, Size Regular Electronic Signature(s) Signed: 07/17/2023 4:19:11 PM By: Shawn Stall RN, BSN Signed: 07/17/2023 4:42:52 PM By: [name] RN Entered By: Shawn Stall on 07/17/2023 09:23:28 -------------------------------------------------------------------------------- Wound Assessment Details Patient Name: Date of Service: [name], [name] 07/17/2023 9:00 A M Medical Record Number: [id number] Patient Account Number: [id number] Date of Birth/Sex: Treating RN: 1976-05-12 (47 y.o. [name] Primary Care Danasia Baker: [name] Other Clinician: Referring Taesha Goodell: Treating Leyton Brownlee/Extender: Huntley Dec in Treatment: [address] AYHAM, PEPE ([id number]) [id number] Page 8 of 9 Wound  Number: 5 Primary Etiology: Pressure Ulcer Wound Location: Right, Plantar Foot Wound Status: Open Wounding Event: Gradually Appeared Comorbid History: Asthma, Angina, Hypertension Date Acquired: 04/27/2023 Weeks Of Treatment: 11 Clustered Wound: No Photos Wound Measurements Length: (cm) 1 Width: (cm) 0.4 Depth: (cm) 0.2 Area: (cm) 0.314 Volume: (cm) 0.063 % Reduction in Area: -912.9% % Reduction in Volume: -2000% Epithelialization: Small (1-33%) Tunneling: No Undermining: No Wound Description Classification: Category/Stage II Wound Margin: Distinct, outline attached Exudate Amount: Medium Exudate Type: Serosanguineous Exudate Color: red, brown Foul Odor After Cleansing: No Slough/Fibrino Yes Wound Bed Granulation Amount: Large (67-100%) Exposed Structure Granulation Quality: Red Fat Layer (Subcutaneous Tissue) Exposed: Yes Necrotic Amount: Small (1-33%) Necrotic Quality: Eschar, Adherent Slough Periwound Skin Texture Texture Color No Abnormalities Noted: No No Abnormalities Noted: Yes Callus: Yes Temperature / Pain Rash: No Temperature: No Abnormality Scarring: Yes Tenderness on Palpation: Yes Moisture No Abnormalities Noted: No Dry / Scaly: Yes Treatment Notes Wound #5 (Foot) Wound Laterality: Plantar, Right Cleanser Peri-Wound Care Ketoconazole Cream 2% Discharge Instruction: Apply Ketoconazole to periwound as needed Zinc Oxide Ointment 30g tube Discharge Instruction: Apply Zinc Oxide to periwound with each dressing change Topical Gentamicin Discharge Instruction: As directed by physician Primary Dressing Maxorb Extra Ag+ Alginate Dressing, 4x4.75 (in/in) Discharge Instruction: Apply to wound bed as instructed Secondary Dressing Drawtex 4x4 in [name], [name] ([id number]) [id number] Page 9 of 9 Discharge Instruction: As needed -Apply over primary dressing as directed. Optifoam Non-Adhesive Dressing, 4x4 in Discharge  Instruction: Apply over primary dressing as directed. Secured With [employer], 4.5x3.1 (in/yd) Discharge Instruction: Secure with Kerlix as directed. Compression Wrap Compression Stockings Add-Ons Electronic Signature(s) Signed: 07/17/2023 4:19:11 PM By: Shawn Stall RN, BSN Signed: 07/17/2023 4:42:52 PM By: [name] RN Entered By: Shawn Stall on 07/17/2023 09:24:07 -------------------------------------------------------------------------------- Vitals Details Patient Name: Date of Service: [name], [name] 07/17/2023 9:00 A M Medical Record Number: [id number] Patient Account Number: [id number] Date of Birth/Sex: Treating RN: May 10, 1976 (47 y.o. [name] Primary Care Hosey Burmester: [name] Other Clinician: Referring Laci Frenkel: Treating Jaimes Eckert/Extender: Huntley Dec in Treatment: 156 Vital Signs Time  Taken: 09:00 Temperature (F): 98.2 Height (in): 69 Pulse (bpm): 80 Weight (lbs): 280 Respiratory Rate (breaths/min): 16 Body Mass Index (BMI): 41.3 Blood Pressure (mmHg): 141/74 Reference Range: 80 - 120 mg / dl Electronic Signature(s) Signed: 07/17/2023 4:42:52 PM By: [name] RN Entered By: [name] on 07/17/2023 09:06:09

## 2023-07-18 ENCOUNTER — Other Ambulatory Visit: Payer: Self-pay

## 2023-07-18 DIAGNOSIS — F419 Anxiety disorder, unspecified: Secondary | ICD-10-CM | POA: Insufficient documentation

## 2023-07-18 DIAGNOSIS — Z87442 Personal history of urinary calculi: Secondary | ICD-10-CM | POA: Insufficient documentation

## 2023-07-18 DIAGNOSIS — E785 Hyperlipidemia, unspecified: Secondary | ICD-10-CM | POA: Insufficient documentation

## 2023-07-18 DIAGNOSIS — I10 Essential (primary) hypertension: Secondary | ICD-10-CM | POA: Insufficient documentation

## 2023-07-18 DIAGNOSIS — R519 Headache, unspecified: Secondary | ICD-10-CM | POA: Insufficient documentation

## 2023-07-18 DIAGNOSIS — I209 Angina pectoris, unspecified: Secondary | ICD-10-CM | POA: Insufficient documentation

## 2023-07-18 DIAGNOSIS — R0600 Dyspnea, unspecified: Secondary | ICD-10-CM | POA: Insufficient documentation

## 2023-07-18 DIAGNOSIS — Z01810 Encounter for preprocedural cardiovascular examination: Secondary | ICD-10-CM | POA: Insufficient documentation

## 2023-07-18 DIAGNOSIS — J45909 Unspecified asthma, uncomplicated: Secondary | ICD-10-CM | POA: Insufficient documentation

## 2023-07-18 HISTORY — DX: Hyperlipidemia, unspecified: E78.5

## 2023-07-19 ENCOUNTER — Ambulatory Visit: Payer: PPO | Attending: Cardiology | Admitting: Cardiology

## 2023-07-19 ENCOUNTER — Encounter: Payer: Self-pay | Admitting: Cardiology

## 2023-07-19 VITALS — BP 130/82 | HR 75 | Ht 68.6 in | Wt 312.0 lb

## 2023-07-19 DIAGNOSIS — I10 Essential (primary) hypertension: Secondary | ICD-10-CM

## 2023-07-19 DIAGNOSIS — J8409 Other alveolar and parieto-alveolar conditions: Secondary | ICD-10-CM

## 2023-07-19 DIAGNOSIS — R0602 Shortness of breath: Secondary | ICD-10-CM

## 2023-07-19 DIAGNOSIS — G4733 Obstructive sleep apnea (adult) (pediatric): Secondary | ICD-10-CM

## 2023-07-19 NOTE — Progress Notes (Signed)
Cardiology Office Note:    Date:  07/19/2023   ID:  [name], DOB 10-26-1975, MRN [id number]  PCP:  Melida Quitter, MD  Cardiologist:  Garwin Brothers, MD   Referring MD: Melida Quitter, MD    ASSESSMENT:    1. Primary hypertension   2. Diffuse alveolar damage (HCC)   3. Obstructive sleep apnea   4. Morbid obesity (HCC) with starting BMI 46   5. SOBOE (shortness of breath on exertion)    PLAN:    In order of problems listed above:  Primary prevention stressed with the patient.  Importance of compliance with diet medication stressed and patient verbalized standing. Dyspnea on exertion: Patient has multiple risk factors for coronary artery disease.  In view of this I discussed CT coronary angiography.  I would like to get a good idea of the coronary anatomy because the patient has multiple risk factors for coronary artery disease and contemplating multiple surgeries.  He is agreeable.  His symptoms are concerning and I will rule out ischemic substrate Cardiac murmur: Echocardiogram will be done to assess murmur heard on auscultation. Essential hypertension: Blood pressure stable and diet was emphasized. Morbid obesity: Weight reduction stressed diet emphasized and he promises to do better. Patient will be seen in follow-up appointment in 6 months or earlier if the patient has any concerns.    Medication Adjustments/Labs and Tests Ordered: Current medicines are reviewed at length with the patient today.  Concerns regarding medicines are outlined above.  Orders Placed This Encounter  Procedures   EKG 12-Lead   No orders of the defined types were placed in this encounter.    History of Present Illness:    [name] is a 47 y.o. male who is being seen today for the evaluation of dyspnea on exertion and preop cardiovascular evaluation at the request of Wile, Nyoka Cowden, MD. patient is a pleasant 47 year old male.  He has past medical history of essential hypertension,  morbid obesity and history of pulmonary alveolar damage.  He has wounds of the lower extremities and is planning to undergo surgery on both feet in a staged fashion.  He also is looking forward to have bariatric surgery.  He is in a wheelchair.  He ambulates not much because of these issues.  He has dyspnea on exertion.  At the time of my evaluation, the patient is alert awake oriented and in no distress.  His wife accompanies him for this visit and his anesthesiologist wants cardiac evaluation before the CTs of multiple surgeries.  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   Critical illness polyneuropathy (HCC) 09/15/2020   Depression    Diffuse alveolar damage (HCC) 03/09/2020   Dyspnea    Encounter for attention to tracheostomy (HCC) 09/15/2020   Encounter for therapeutic drug monitoring 09/29/2020   Epigastric pain    Eschar of foot    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   Fever    GERD (gastroesophageal reflux disease)    Goals of care, counseling/discussion    Gram positive bacterial infection    Headache    History of COVID-19    History of kidney stones    LEFT URETERAL STONE   HTN (hypertension)    Hyperlipidemia 07/18/2023   Impaired sensation to light touch 04/15/2020   Insomnia due to other mental disorder 12/06/2021   Left foot drop 09/06/2021   Leukopenia    Long COVID 09/09/2020   Moderate persistent asthma 07/21/2015    Moderate protein-calorie malnutrition (HCC) 09/15/2020   Mood disorder (HCC) 09/07/2022   Morbid obesity (HCC) with starting BMI 46 09/15/2020   MSSA (methicillin susceptible Staphylococcus aureus) infection 08/31/2020   Need for emotional support    Nocturnal hypoxemia 09/07/2022   Obstructive sleep apnea 01/11/2022   Other fatigue 09/07/2022   Palliative care by specialist    Personal history of ECMO    PES PLANUS 10/08/2009   Qualifier: Diagnosis of   By: Pearletha Forge MD, Vincenza Hews      Replacing diagnoses that were inactivated after the 11/07/22 regulatory import     Pneumonia 07/2019   covid   Prediabetes    Preop cardiovascular exam    Pressure injury of skin 08/22/2019   Primary hypertension 04/17/2017   Pyogenic inflammation of bone (HCC) 09/09/2020   Reactive depression 07/01/2020   Septic arthritis of right acromioclavicular joint (HCC) 09/29/2017   SOBOE (shortness of breath on exertion)    Subcutaneous crepitus    TARSAL TUNNEL SYNDROME, LEFT 10/08/2009   Qualifier: Diagnosis of   By: Pearletha Forge MD, Shane         Thrombocytopenia (HCC)    Type 2 diabetes mellitus without complication, without long-term current use of insulin (HCC) 10/18/2022   Ulcer of left foot (HCC) 09/07/2022   Vitamin D deficiency 10/18/2022   Wheelchair dependence 06/04/2021    Past Surgical History:  Procedure Laterality Date   CANNULATION FOR ECMO (EXTRACORPOREAL MEMBRANE OXYGENATION) N/A 08/28/2019   Procedure: CANNULATION FOR VV ECMO (EXTRACORPOREAL MEMBRANE OXYGENATION);  Surgeon: Donata Clay, Theron Arista, MD;  Location: [facility] OR;  Service: Open Heart Surgery;  Laterality: N/A;  CRESCENT CANNULA   CANNULATION FOR ECMO (EXTRACORPOREAL MEMBRANE OXYGENATION) N/A 09/10/2019   Procedure: CANNULATION FOR ECMO (EXTRACORPOREAL MEMBRANE OXYGENATION) PUTTING IN CRESCENT 32FR CANNULA  AND REMOVING GROING CANNULATION;  Surgeon: Linden Dolin, MD;  Location: [facility] OR;  Service: Open Heart Surgery;  Laterality: N/A;  PUTTING IN  CRESCENT/REMOVING GROIN CANNULATION   CYSTOSCOPY/URETEROSCOPY/HOLMIUM LASER/STENT PLACEMENT Left 04/18/2019   Procedure: LEFT URETEROSCOPY/HOLMIUM LASER/STENT PLACEMENT;  Surgeon: Crist Fat, MD;  [facility];  Service: Urology;  Laterality: Left;   CYSTOSCOPY/URETEROSCOPY/HOLMIUM LASER/STENT PLACEMENT Left 05/02/2019   Procedure: CYSTOSCOPY/URETEROSCOPY/HOLMIUM LASER/STENT EXCHANGE;  Surgeon: Crist Fat, MD;  Location: [facility] ORS;  Service: Urology;  Laterality: Left;   ECMO CANNULATION N/A 08/03/2019   Procedure: ECMO CANNULATION;  Surgeon: Linden Dolin, MD;  Location: [facility];  Service: Cardiovascular;  Laterality: N/A;   ESOPHAGOGASTRODUODENOSCOPY N/A 09/11/2019   Procedure: ESOPHAGOGASTRODUODENOSCOPY (EGD);  Surgeon: Linden Dolin, MD;  Location: [facility] OR;  Service: Thoracic;  Laterality: N/A;   GRAFT APPLICATION Bilateral 03/27/2020   Procedure: APPLICATION OF SKIN GRAFT BILATERAL FEET;  Surgeon: Felecia Shelling, DPM;  Location: [facility];  Service: Podiatry;  Laterality: Bilateral;   IR REPLC GASTRO/COLONIC TUBE PERCUT W/FLUORO  10/14/2019   IRRIGATION AND DEBRIDEMENT SHOULDER Right 09/29/2017  Procedure: IRRIGATION AND DEBRIDEMENT SHOULDER;  Surgeon: Tarry Kos, MD;  Location: [facility] OR;  Service: Orthopedics;  Laterality: Right;   LUMBAR DISC SURGERY  2002   NASAL ENDOSCOPY WITH EPISTAXIS CONTROL N/A 08/31/2019   Procedure: NASAL ENDOSCOPY WITH EPISTAXIS CONTROL WITH CAUTERIZATION;  Surgeon: Christia Reading, MD;  Location: [facility] OR;  Service: ENT;  Laterality: N/A;   PORTACATH PLACEMENT N/A 09/11/2019   Procedure: PEG TUBE INSERTION - BEDSIDE;  Surgeon: Linden Dolin, MD;  Location: [facility] OR;  Service: Thoracic;  Laterality: N/A;   TEE WITHOUT CARDIOVERSION N/A 08/28/2019   Procedure: TRANSESOPHAGEAL ECHOCARDIOGRAM (TEE);  Surgeon: Donata Clay, Theron Arista, MD;  Location: [facility] OR;  Service: Open Heart Surgery;  Laterality: N/A;   TEE WITHOUT  CARDIOVERSION N/A 09/10/2019   Procedure: TRANSESOPHAGEAL ECHOCARDIOGRAM (TEE);  Surgeon: Linden Dolin, MD;  Location: [facility] OR;  Service: Open Heart Surgery;  Laterality: N/A;   WOUND DEBRIDEMENT Bilateral 03/27/2020   Procedure: EXCISIONAL DEBRIDEMENT OF ULCERS BILATERAL FEET;  Surgeon: Felecia Shelling, DPM;  Location: [facility];  Service: Podiatry;  Laterality: Bilateral;    Current Medications: Current Meds  Medication Sig   albuterol (VENTOLIN HFA) 108 (90 Base) MCG/ACT inhaler TAKE 2 PUFFS BY MOUTH EVERY 4 HOURS AS NEEDED   amLODipine (NORVASC) 5 MG tablet Take 5 mg by mouth daily.   Budeson-Glycopyrrol-Formoterol (BREZTRI AEROSPHERE) 160-9-4.8 MCG/ACT AERO Inhale 2 puffs into the lungs in the morning and at bedtime.   cetirizine (ZYRTEC) 10 MG tablet Take 10 mg by mouth daily.   DULoxetine (CYMBALTA) 60 MG capsule 1 capsule Orally Once a day for 90   HYDROcodone-acetaminophen (NORCO) 5-325 MG tablet Take 1 tablet by mouth 2 (two) times daily as needed for moderate pain (pain score 4-6).   ipratropium (ATROVENT) 0.06 % nasal spray Place 2 sprays into both nostrils 3 (three) times daily.   lamoTRIgine (LAMICTAL) 100 MG tablet Take 1 tablet (100 mg total) by mouth daily.   metoprolol tartrate (LOPRESSOR) 25 MG tablet Take 25 mg by mouth 2 (two) times daily.   montelukast (SINGULAIR) 10 MG tablet Take 1 tablet (10 mg total) by mouth daily.   OXYGEN Inhale 2 L into the lungs at bedtime.   pantoprazole (PROTONIX) 40 MG tablet Take 1 tablet by mouth daily.   sildenafil (VIAGRA) 50 MG tablet 1 tablet as needed Orally Once a day   traZODone (DESYREL) 100 MG tablet Take 2 tablets (200 mg total) by mouth at bedtime.   triamcinolone (NASACORT) 55 MCG/ACT AERO nasal inhaler Place 2 sprays into the nose daily.   Vitamin D, Ergocalciferol, (DRISDOL) 1.25 MG (50000 UNIT) CAPS capsule TAKE 1 CAPSULE (50,000 UNITS TOTAL) BY MOUTH EVERY 7 (SEVEN) DAYS     Allergies:   Patient has no known allergies.    Social History   Socioeconomic History   Marital status: Married    Spouse name: Not on file   Number of children: 1   Years of education: Not on file   Highest education level: Not on file  Occupational History   Occupation: delivery driver   Occupation: Stay at home spouse  Tobacco Use   Smoking status: Never   Smokeless tobacco: Never  Vaping Use   Vaping status: Never Used  Substance and Sexual Activity   Alcohol use: Yes    Comment: rarely 2-3 times a year   Drug use: No   Sexual activity: Yes    Partners: Female  Other Topics Concern   Not on file  Social History Narrative   Not on file   Social Determinants of Health   Financial Resource Strain: Not on file  Food Insecurity: Not on file  Transportation Needs: Not on file  Physical Activity: Not on file  Stress: Not on file  Social Connections: Not on file     Family History: The patient's family history includes Asthma in his brother; Diabetes in his father and paternal grandmother; GER disease in his mother; Heart disease in his father and maternal grandfather; Hyperlipidemia in his father; Hypertension in his father and paternal grandmother; Migraines in his mother; Obesity in his father; Pancreatic cancer in his maternal grandmother; Sleep apnea in his father.  ROS:   Please see the history of present illness.    All other systems reviewed and are negative.  EKGs/Labs/Other Studies Reviewed:    The following studies were reviewed today:  EKG Interpretation Date/Time:  Wednesday July 19 2023 09:30:25 EST Ventricular Rate:  75 PR Interval:  184 QRS Duration:  96 QT Interval:  398 QTC Calculation: 444 R Axis:   125  Text Interpretation: Normal sinus rhythm with sinus arrhythmia Incomplete right bundle branch block Left posterior fascicular block Anteroseptal infarct (cited on or before 03-Oct-2019) When compared with ECG of 15-Feb-2023 10:14, Left posterior fascicular block is now Present  Criteria for Inferior infarct are no longer Present Questionable change in initial forces of Lateral leads Nonspecific T wave abnormality, improved in Lateral leads Confirmed by Belva Crome [phone]) on 07/19/2023 9:51:20 AM     Recent Labs: 09/21/2022: ALT 23; BUN 17; Creatinine, Ser 0.94; Hemoglobin 16.0; Platelets 132; Potassium 4.3; Sodium 145; TSH 1.410  Recent Lipid Panel    Component Value Date/Time   CHOL 156 09/21/2022 1243   TRIG 148 09/21/2022 1243   HDL 33 (L) 09/21/2022 1243   CHOLHDL 4.7 09/21/2022 1243   CHOLHDL 3.6 04/18/2017 0336   VLDL 12 04/18/2017 0336   LDLCALC 97 09/21/2022 1243    Physical Exam:    VS:  BP 130/82   Pulse 75   Ht 5' 8.6" (1.742 m)   Wt (!) 312 lb (141.5 kg)   SpO2 96%   BMI 46.61 kg/m     Wt Readings from Last 3 Encounters:  07/19/23 (!) 312 lb (141.5 kg)  07/05/23 (!) 309 lb (140.2 kg)  06/30/23 (!) 307 lb 8.7 oz (139.5 kg)     GEN: Patient is in no acute distress HEENT: Normal NECK: No JVD; No carotid bruits LYMPHATICS: No lymphadenopathy CARDIAC: S1 S2 regular, 2/6 systolic murmur at the apex. RESPIRATORY:  Clear to auscultation without rales, wheezing or rhonchi  ABDOMEN: Soft, non-tender, non-distended MUSCULOSKELETAL:  No edema; No deformity  SKIN: Warm and dry NEUROLOGIC:  Alert and oriented x 3 PSYCHIATRIC:  Normal affect    Signed, Garwin Brothers, MD  07/19/2023 10:12 AM    [facility]

## 2023-07-19 NOTE — Patient Instructions (Signed)
Medication Instructions:  Your physician recommends that you continue on your current medications as directed. Please refer to the Current Medication list given to you today.   *If you need a refill on your cardiac medications before your next appointment, please call your pharmacy*   Lab Work: None ordered   If you have labs (blood work) drawn today and your tests are completely normal, you will receive your results only by: MyChart Message (if you have MyChart) OR A paper copy in the mail If you have any lab test that is abnormal or we need to change your treatment, we will call you to review the results.    Testing/Procedures:   Your cardiac CT will be scheduled at the following location:   [facility] [address] Floor, [address] [place],  [place]  [address]  Main: [phone]  Hold all erectile dysfunction medications at least 3 days (72 hrs) prior to test. (Ie viagra, cialis, sildenafil, tadalafil, etc) We will administer nitroglycerin during this exam.   On the Night Before the Test: Be sure to Drink plenty of water. Do not consume any caffeinated/decaffeinated beverages or chocolate 12 hours prior to your test. Do not take any antihistamines 12 hours prior to your test.   On the Day of the Test: Drink plenty of water until 1 hour prior to the test. Do not eat any food 1 hour prior to test. You may take your regular medications prior to the test.  Take metoprolol (Lopressor) 100 mg (4 tablets) two hours prior to test. HOLD Furosemide/Hydrochlorothiazide morning of the test.  After the Test: Drink plenty of water. After receiving IV contrast, you may experience a mild flushed feeling. This is normal. On occasion, you may experience a mild rash up to 24 hours after the test. This is not dangerous. If this occurs, you can take Benadryl 25 mg and increase your fluid intake. If you experience trouble breathing, this can be  serious. If it is severe call 911 IMMEDIATELY. If it is mild, please call our office. If you take any of these medications: Glipizide/Metformin, Avandament, Glucavance, please do not take 48 hours after completing test unless otherwise instructed.  We will call to schedule your test 2-4 weeks out understanding that some insurance companies will need an authorization prior to the service being performed.   For non-scheduling related questions, please contact the cardiac imaging nurse navigator should you have any questions/concerns: Rockwell Alexandria, Cardiac Imaging Nurse Navigator Larey Brick, Cardiac Imaging Nurse Navigator [facility] Direct Office Dial: [phone]   For scheduling needs, including cancellations and rescheduling, please call [place], [phone].   Your physician has requested that you have an echocardiogram. Echocardiography is a painless test that uses sound waves to create images of your heart. It provides your doctor with information about the size and shape of your heart and how well your heart's chambers and valves are working. This procedure takes approximately one hour. There are no restrictions for this procedure. Please do NOT wear cologne, perfume, aftershave, or lotions (deodorant is allowed). Please arrive 15 minutes prior to your appointment time.   Your next appointment:   9 month(s)  The format for your next appointment:   In Person  Provider:   Belva Crome, MD   Other Instructions Cardiac CT Angiogram A cardiac CT angiogram is a procedure to look at the heart and the area around the heart. It may be done  to help find the cause of chest pains or other symptoms of heart disease. During this procedure, a substance called contrast dye is injected into the blood vessels in the area to be checked. A large X-ray machine, called a CT scanner, then takes detailed pictures of the heart and the surrounding area. The procedure is  also sometimes called a coronary CT angiogram, coronary artery scanning, or CTA. A cardiac CT angiogram allows the health care provider to see how well blood is flowing to and from the heart. The health care provider will be able to see if there are any problems, such as: Blockage or narrowing of the coronary arteries in the heart. Fluid around the heart. Signs of weakness or disease in the muscles, valves, and tissues of the heart. Tell a health care provider about: Any allergies you have. This is especially important if you have had a previous allergic reaction to contrast dye. All medicines you are taking, including vitamins, herbs, eye drops, creams, and over-the-counter medicines. Any blood disorders you have. Any surgeries you have had. Any medical conditions you have. Whether you are pregnant or may be pregnant. Any anxiety disorders, chronic pain, or other conditions you have that may increase your stress or prevent you from lying still. What are the risks? Generally, this is a safe procedure. However, problems may occur, including: Bleeding. Infection. Allergic reactions to medicines or dyes. Damage to other structures or organs. Kidney damage from the contrast dye that is used. Increased risk of cancer from radiation exposure. This risk is low. Talk with your health care provider about: The risks and benefits of testing. How you can receive the lowest dose of radiation. What happens before the procedure? Wear comfortable clothing and remove any jewelry, glasses, dentures, and hearing aids. Follow instructions from your health care provider about eating and drinking. This may include: For 12 hours before the procedure -- avoid caffeine. This includes tea, coffee, soda, energy drinks, and diet pills. Drink plenty of water or other fluids that do not have caffeine in them. Being well hydrated can prevent complications. For 4-6 hours before the procedure -- stop eating and drinking.  The contrast dye can cause nausea, but this is less likely if your stomach is empty. Ask your health care provider about changing or stopping your regular medicines. This is especially important if you are taking diabetes medicines, blood thinners, or medicines to treat problems with erections (erectile dysfunction). What happens during the procedure?  Hair on your chest may need to be removed so that small sticky patches called electrodes can be placed on your chest. These will transmit information that helps to monitor your heart during the procedure. An IV will be inserted into one of your veins. You might be given a medicine to control your heart rate during the procedure. This will help to ensure that good images are obtained. You will be asked to lie on an exam table. This table will slide in and out of the CT machine during the procedure. Contrast dye will be injected into the IV. You might feel warm, or you may get a metallic taste in your mouth. You will be given a medicine called nitroglycerin. This will relax or dilate the arteries in your heart. The table that you are lying on will move into the CT machine tunnel for the scan. The person running the machine will give you instructions while the scans are being done. You may be asked to: Keep your arms above your  head. Hold your breath. Stay very still, even if the table is moving. When the scanning is complete, you will be moved out of the machine. The IV will be removed. The procedure may vary among health care providers and hospitals. What can I expect after the procedure? After your procedure, it is common to have: A metallic taste in your mouth from the contrast dye. A feeling of warmth. A headache from the nitroglycerin. Follow these instructions at home: Take over-the-counter and prescription medicines only as told by your health care provider. If you are told, drink enough fluid to keep your urine pale yellow. This will  help to flush the contrast dye out of your body. Most people can return to their normal activities right after the procedure. Ask your health care provider what activities are safe for you. It is up to you to get the results of your procedure. Ask your health care provider, or the department that is doing the procedure, when your results will be ready. Keep all follow-up visits as told by your health care provider. This is important. Contact a health care provider if: You have any symptoms of allergy to the contrast dye. These include: Shortness of breath. Rash or hives. A racing heartbeat. Summary A cardiac CT angiogram is a procedure to look at the heart and the area around the heart. It may be done to help find the cause of chest pains or other symptoms of heart disease. During this procedure, a large X-ray machine, called a CT scanner, takes detailed pictures of the heart and the surrounding area after a contrast dye has been injected into blood vessels in the area. Ask your health care provider about changing or stopping your regular medicines before the procedure. This is especially important if you are taking diabetes medicines, blood thinners, or medicines to treat erectile dysfunction. If you are told, drink enough fluid to keep your urine pale yellow. This will help to flush the contrast dye out of your body. This information is not intended to replace advice given to you by your health care provider. Make sure you discuss any questions you have with your health care provider. Document Revised: 03/20/2019 Document Reviewed: 03/20/2019 Elsevier Patient Education  [employer].  Echocardiogram An echocardiogram is a test that uses sound waves to make images of your heart. This way of making images is often called ultrasound. The images from this test can help find out many things about your heart, including: The size and shape of your heart. The strength of your heart muscle and  how well it's working. The size, thickness, and movement of your heart's walls. How your heart valves are working. Problems such as: A tumor or a growth from an infection around the heart valves. Areas of heart muscle that aren't working well because of poor blood flow or injury from a heart attack. An aneurysm. This is a weak or damaged part of an artery wall. An artery is a blood vessel. Tell a health care provider about: Any allergies you have. All medicines you're taking, including vitamins, herbs, eye drops, creams, and over-the-counter medicines. Any bleeding problems you have. Any surgeries you've had. Any medical problems you have. Whether you're pregnant or may be pregnant. What are the risks? Your health care provider will talk with you about risks. These may include an allergic reaction to IV dye that may be used during the test. What happens before the test? You don't need to do anything to get  ready for this test. You may eat and drink normally. What happens during the test?  You'll take off your clothes from the waist up and put on a hospital gown. Sticky patches called electrodes may be placed on your chest. These will be connected to a machine that monitors your heart rate and rhythm. You'll lie down on a table for the exam. A wand covered in gel will be moved over your chest. Sound waves from the wand will go to your heart and bounce back--or "echo" back. The sound waves will go to a computer that uses them to make images of your heart. The images can be viewed on a monitor. The images will also be recorded on the computer so your provider can look at them later. You may be asked to change positions or hold your breath for a short time. This makes it easier to get different views or better views of your heart. In some cases, you may be given a dye through an IV. The IV is put into one of your veins. This dye can make the areas of your heart easier to see. The procedure may  vary among providers and hospitals. What can I expect after the test? You may return to your normal diet, activities, and medicines unless your provider tells you not to. If an IV was placed for the test, it will be removed. It's up to you to get the results of your test. Ask your provider, or the department that's doing the test, when your results will be ready. This information is not intended to replace advice given to you by your health care provider. Make sure you discuss any questions you have with your health care provider. Document Revised: 09/23/2022 Document Reviewed: 09/23/2022 Elsevier Patient Education  2024 [employer].

## 2023-07-23 ENCOUNTER — Other Ambulatory Visit (INDEPENDENT_AMBULATORY_CARE_PROVIDER_SITE_OTHER): Payer: Self-pay | Admitting: Family Medicine

## 2023-07-23 DIAGNOSIS — E559 Vitamin D deficiency, unspecified: Secondary | ICD-10-CM

## 2023-07-24 ENCOUNTER — Encounter: Payer: PPO | Admitting: Surgical

## 2023-07-25 ENCOUNTER — Encounter: Payer: PPO | Attending: Surgery | Admitting: Dietician

## 2023-07-25 ENCOUNTER — Encounter: Payer: Self-pay | Admitting: Dietician

## 2023-07-25 VITALS — Ht 68.5 in | Wt 315.5 lb

## 2023-07-25 DIAGNOSIS — E119 Type 2 diabetes mellitus without complications: Secondary | ICD-10-CM | POA: Insufficient documentation

## 2023-07-25 DIAGNOSIS — E669 Obesity, unspecified: Secondary | ICD-10-CM | POA: Diagnosis not present

## 2023-07-25 NOTE — Progress Notes (Signed)
Supervised Weight Loss Visit Bariatric Nutrition Education Appt Start Time: 11:54    End Time: 12:26  Planned surgery: Sleeve Gastrectomy (pt states the surgeon is suggesting the sleeve, he states he is leaning toward the RYGB)  Pt expectation of surgery: more confidence and being healthier; do more with family  Referral stated Supervised Weight Loss (SWL) visits needed: 5  4 out of 5 SWL Appointments   NUTRITION ASSESSMENT   Anthropometrics  Start weight at [facility]: 303.9 lbs (date: 04/14/2023) Height: 68.5 in Weight today: 315.5 lbs BMI: 47.27 kg/m2     Clinical   Pharmacotherapy: History of weight loss medication used: Ozempic and Mounjaro, stating insurance started not covering.  Medical hx: asthma, obesity, sleep apnea, HTN Medications: vit D, nasacort, desyrel, miagra, protonix, singulair,  lamictal, atrovent, norco, synalar, cymbalta, symbicort, norvasc, aluterol,  Labs: vit D 21.1; HDL 33; A1c 6.1; sodium 145; albumin 4.0; RBC 6.06; platelets 132 Notable signs/symptoms: wounds from pressure area in the hospital years ago; arrived in wheel chair Any previous deficiencies? No  Lifestyle & Dietary Hx  Pt states he went to physical therapy last week, stating he will do exercises at home 3 days per week, about 45 minutes each time. Pt states he is getting up about 9 am each day, stating he will go to bed around 1 am. Pt states he hasn't been sleeping well in the past few weeks, stating last night was better. Pt arrived in good spirits, stating he talked to a friend yesterday; and pt is trying to keep his own spirits up.  Estimated daily fluid intake: 64+ oz Supplements: vit D, vit C, zinc Current average weekly physical activity: Pt states he went to physical therapy last week, stating he will do exercises at home 3 days per week, about 45 minutes each time. All sitting (chair exercises)  24-Hr Dietary Recall First Meal: skip or 4 egg whites, 1-2 slices of sara lee delights  bread, sf jam, [place] sausage, Fairlife fat free milk or fruit and peanut butter Snack: fruit or protein shake or triscuit or wheat thin or cheese its Second Meal: salad with lean meat (deli), lean cuisine steamer bowl or spring mix salad with sliced meat and cheese. Snack: fruit or protein shake or triscuit or wheat thin or cheese its Third Meal: varies, lean meat, vegetable, rice Snack: sometimes baked cheese its Beverages: water, one regular soda a day or every other day  Estimated Energy Needs Calories: 1600  NUTRITION DIAGNOSIS  Overweight/obesity ([place]-3.3) related to past poor dietary habits and physical inactivity as evidenced by patient w/ planned sleeve or RYGB surgery following dietary guidelines for continued weight loss.  NUTRITION INTERVENTION  Nutrition counseling (C-1) and education (E-2) to facilitate bariatric surgery goals.  Encouraged patient to honor their body's internal hunger and fullness cues.  Throughout the day, check in mentally and rate hunger. Stop eating when satisfied not full regardless of how much food is left on the plate.  Get more if still hungry 20-30 minutes later.  The key is to honor satisfaction so throughout the meal, rate fullness factor and stop when comfortably satisfied not physically full. The key is to honor hunger and fullness without any feelings of guilt or shame.  Pay attention to what the internal cues are, rather than any external factors. This will enhance the confidence you have in listening to your own body and following those internal cues enabling you to increase how often you eat when you are hungry not out of  appetite and stop when you are satisfied not full.  Encouraged pt to continue to eat balanced meals inclusive of non starchy vegetables 2 times a day 7 days a week Encouraged pt to choose lean protein sources: limiting beef, pork, sausage, hotdogs, and lunch meat Encourage pt to choose healthy fats such as plant based limiting  animal fats Encouraged pt to continue to drink a minium 64 fluid ounces with half being plain water to satisfy proper hydration. Why you need complex carbohydrates: Whole grains and other complex carbohydrates are required to have a healthy diet. Whole grains provide fiber which can help with blood glucose levels and help keep you satiated. Fruits and starchy vegetables provide essential vitamins and minerals required for immune function, eyesight support, brain support, bone density, wound healing and many other functions within the body. According to the current evidenced based 2020-2025 Dietary Guidelines for Americans, complex carbohydrates are part of a healthy eating pattern which is associated with a decreased risk for type 2 diabetes, cancers, and cardiovascular disease.   Purpose of hydration: Water makes up over 50% of your total body water, and is part of many organs throughout the body. Water is essential to transport digested nutrients, regulate body temperature, rid the body of waste products, and protects joints and the spinal cord. When not properly hydrated you will begin to experience headaches, cramps and dizziness. Further dehydration can result in rapid heart rate, shock, oliguria, and may cause seizures.  https://www.merckmanuals.com/home/hormonal-and-metabolic-disordehttps://www.usgs.gov/special-topic/water-science-school/science/water-you-water-and-human-body?qt-science_center_objects=0#qt-science_center_objectsrs/water-balance/about-body-water FriendLock.it InvestmentInstructor.com.cy FurEliminator.es https://www.health.BasicFM.no  Pre-Op Goals Progress & New Goals Continue: include a protein food with every meal or snacks Continue: avoid skipping meals; eat every 3-5 hours; keep  sleep routine and scheduled meals; meal prepping and meal planning. Continue: Eat non starchy vegetables 2 times or more a day 7 days a week. Continue: practice separating fluids from your meals and snacks; stop drinking 15 minutes before you eat and don't drink while you eat and for 30 minutes after. Continue: increase physical activity; use physical therapy exercises Continue: slow down at meals and snacks, chew well, aim for satisfaction before fullness, take 20-30 minutes to eat. Re-engage/Continue: avoid sugar sweetened beverages. Aim for mostly plain water. Limit fluids to zero calorie (max 5 calories) and no carbonation.  Handouts Provided Include  Bariatric MyPlate Goals Printed  Learning Style & Readiness for Change Teaching method utilized: Visual & Auditory  Demonstrated degree of understanding via: Teach Back  Readiness Level: contemplative Barriers to learning/adherence to lifestyle change: mobility, motivation  RD's Notes for next Visit  Patient progress toward chosen goals.  MONITORING & EVALUATION Dietary intake, weekly physical activity, body weight, and pre-op goals.  Next Steps  Patient is to return to [facility] in one month for next SWL visit.

## 2023-07-28 ENCOUNTER — Encounter (HOSPITAL_BASED_OUTPATIENT_CLINIC_OR_DEPARTMENT_OTHER): Payer: PPO | Admitting: General Surgery

## 2023-07-28 DIAGNOSIS — L89623 Pressure ulcer of left heel, stage 3: Secondary | ICD-10-CM | POA: Diagnosis not present

## 2023-07-28 DIAGNOSIS — L97512 Non-pressure chronic ulcer of other part of right foot with fat layer exposed: Secondary | ICD-10-CM | POA: Diagnosis not present

## 2023-07-28 DIAGNOSIS — L89892 Pressure ulcer of other site, stage 2: Secondary | ICD-10-CM | POA: Diagnosis not present

## 2023-07-29 NOTE — Progress Notes (Signed)
[name], [name] ([id number]) [phone].pdf Page 1 of 17 Visit Report for 07/28/2023 Chief Complaint Document Details Patient Name: Date of Service: [name], TO [name] E. 07/28/2023 7:45 A M Medical Record Number: [id number] Patient Account Number: [id number] Date of Birth/Sex: Treating RN: [birth year] (47 y.o. M) Primary Care Provider: Dorinda Hill Other Clinician: Referring Provider: Treating Provider/Extender: Jana Hakim in Treatment: 158 Information Obtained from: Patient Chief Complaint Bilateral Plantar Foot Ulcers Electronic Signature(s) Signed: 07/28/2023 10:26:47 AM By: [name] MD FACS Entered By: [name] on 07/28/2023 05:16:14 -------------------------------------------------------------------------------- Debridement Details Patient Name: Date of Service: [name], [name] 07/28/2023 7:45 A M Medical Record Number: [id number] Patient Account Number: [id number] Date of Birth/Sex: Treating RN: October 23, 1975 (47 y.o. [name] Primary Care Provider: Dorinda Hill Other Clinician: Referring Provider: Treating Provider/Extender: Jana Hakim in Treatment: 158 Debridement Performed for Assessment: Wound #2 Left Calcaneus Performed By: Physician [name], MD The following information was scribed by: Zenaida Deed The information was scribed for: [name] Debridement Type: Debridement Level of Consciousness (Pre-procedure): Awake and Alert Pre-procedure Verification/Time Out Yes - 07:50 Taken: Start Time: 07:51 Pain Control: Lidocaine 4% Topical Solution Percent of Wound Bed Debrided: 125% T Area Debrided (cm): otal 4.67 Tissue and other material debrided: Callus, Eschar, Slough, Subcutaneous, Skin: Epidermis, Slough Level: Skin/Subcutaneous Tissue Debridement Description: Excisional Instrument: Curette Bleeding: Minimum Hemostasis Achieved: Pressure Procedural  Pain: 3 Post Procedural Pain: 1 Response to Treatment: Procedure was tolerated well Level of Consciousness (Post- Awake and Alert procedure): Post Debridement Measurements of Total Wound Length: (cm) 3.4 Stage: Category/Stage III Width: (cm) 1.4 Depth: (cm) 0.1 Volume: (cm) 0.374 Character of Wound/Ulcer Post Debridement: Improved [name], [name] ([id number]) [id number] Page 2 of 17 Post Procedure Diagnosis Same as Pre-procedure Electronic Signature(s) Signed: 07/28/2023 10:26:47 AM By: [name] MD FACS Signed: 07/28/2023 12:52:17 PM By: Zenaida Deed RN, BSN Entered By: Zenaida Deed on 07/28/2023 04:57:35 -------------------------------------------------------------------------------- Debridement Details Patient Name: Date of Service: [name], [name] 07/28/2023 7:45 A M Medical Record Number: [id number] Patient Account Number: [id number] Date of Birth/Sex: Treating RN: [birth year] (47 y.o. [name] Primary Care Provider: Dorinda Hill Other Clinician: Referring Provider: Treating Provider/Extender: Jana Hakim in Treatment: 158 Debridement Performed for Assessment: Wound #5 Right,Plantar Foot Performed By: Physician [name], MD The following information was scribed by: Zenaida Deed The information was scribed for: [name] Debridement Type: Debridement Level of Consciousness (Pre-procedure): Awake and Alert Pre-procedure Verification/Time Out Yes - 07:50 Taken: Start Time: 07:51 Pain Control: Lidocaine 4% Topical Solution Percent of Wound Bed Debrided: 125% T Area Debrided (cm): otal 1.18 Tissue and other material debrided: Callus, Eschar, Slough, Subcutaneous, Skin: Epidermis, Slough Level: Skin/Subcutaneous Tissue Debridement Description: Excisional Instrument: Curette Bleeding: Minimum Hemostasis Achieved: Pressure Procedural Pain: 3 Post Procedural Pain: 1 Response to  Treatment: Procedure was tolerated well Level of Consciousness (Post- Awake and Alert procedure): Post Debridement Measurements of Total Wound Length: (cm) 3 Stage: Category/Stage II Width: (cm) 0.4 Depth: (cm) 0.2 Volume: (cm) 0.188 Character of Wound/Ulcer Post Debridement: Improved Post Procedure Diagnosis Same as Pre-procedure Electronic Signature(s) Signed: 07/28/2023 10:26:47 AM By: [name] MD FACS Signed: 07/28/2023 12:52:17 PM By: Zenaida Deed RN, BSN Entered By: Zenaida Deed on 07/28/2023 04:58:21 HPI Details -------------------------------------------------------------------------------- [name] ([id number]) [id number] Page 3 of 17 Patient Name: Date of Service: [name], TO [name] E. 07/28/2023 7:45 A M Medical Record Number: [id number] Patient Account Number: [id number] Date of  Birth/Sex: Treating RN: September 07, 1975 (47 y.o. M) Primary Care Provider: Dorinda Hill Other Clinician: Referring Provider: Treating Provider/Extender: Jana Hakim in Treatment: 158 History of Present Illness HPI Description: Wounds are12/03/2020 upon evaluation today patient presents for initial inspection here in our clinic concerning issues he has been having with the bottoms of his feet bilaterally. He states these actually occurred as wounds when he was hospitalized for 5 months secondary to Covid. He was apparently with tilting bed where he was in an upright position quite frequently and apparently this occurred in some way shape or form during that time. Fortunately there is no sign of active infection at this time. No fevers, chills, nausea, vomiting, or diarrhea. With that being said he still has substantial wounds on the plantar aspects of his feet Theragen require quite a bit of work to get these to heal. He has been using Santyl currently though that is been problematic both in receiving the medication as well as actually  paid for it as it is become quite expensive. Prior to the experience with Covid the patient really did not have any major medical problems other than hypertension he does have some mild generalized weakness following the Covid experience. 07/22/2020 on evaluation today patient appears to be doing okay in regard to his foot ulcers I feel like the wound beds are showing signs of better improvement that I do believe the Iodoflex is helping in this regard. With that being said he does have a lot of drainage currently and this is somewhat blue/green in nature which is consistent with Pseudomonas. I do think a culture today would be appropriate for [place] to evaluate and see if that is indeed the case I would likely start him on antibiotic orally as well he is not allergic to Cipro knows of no issues he has had in the past 12/21; patient was admitted to the clinic earlier this month with bilateral presumed pressure ulcers on the bottom of his feet apparently related to excessive pressure from a tilt table arrangement in the intensive care unit. Patient relates this to being on ECMO but I am not really sure that is exactly related to that. I must say I have never seen anything like this. He has fairly extensive full-thickness wounds extending from his heel towards his midfoot mostly centered laterally. There is already been some healing distally. He does not appear to have an arterial issue. He has been using gentamicin to the wound surfaces with Iodoflex to help with ongoing debridement 1/6; this is a patient with pressure ulcers on the bottom of his feet related to excessive pressure from a standing position in the intensive care unit. He is complaining of a lot of pain in the right heel. He is not a diabetic. 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 We have been using Iodoflex to help prepare the surfaces of both wounds for an advanced treatment product. He is nonambulatory spending  most of his time in a wheelchair I have asked him not to propel the wheelchair with his heels 1/13; in general his wounds look better not much surface area change we have been using Iodoflex as of last week. I did an x-ray of the right heel as the patient was complaining of pain. I had some thoughts about a stress fracture perhaps Achilles tendon problems however what it showed was erosive changes along the inferior aspect of the calcaneus he now has a MRI booked for 1/20. 1/20;  in general his wounds continue to be better. Some improvement in the large narrow areas proximally in his foot. He is still complaining of pain in the right heel and tenderness in certain areas of this wound. His MRI is tonight. I am not just looking for osteomyelitis that was brought up on the x-ray I am wondering about stress fractures, tendon ruptures etc. He has no such findings on the left. Also noteworthy is that the patient had critical illness neuropathy and some of the discomfort may be actual improvement in nerve function I am just not sure. These wounds were initially in the setting of severe critical illness related to COVID-19. He was put in a standing position. He may have also been on pressors at the point contributing to tissue ischemia. By his description at some point these wounds were grossly necrotic extending proximally up into the Achilles part of his heel. I do not know that I have ever really seen pictures of them like this although they may exist in epic We have ordered Tri layer Oasis. I am trying to stimulate some granulation in these areas. This is of course assuming the MRI is negative for infection 1/27; since the patient was last here he saw Dr. Earlene Plater of infectious disease. He is planned for vancomycin and ceftriaxone. Prior operative culture grew MSSA. Also ordered baseline lab work. He also ordered arterial studies although the ABIs in our clinic were normal as well as his clinical exam these  were normal I do not think he needs to see vascular surgery. His ABIs at the [facility] were 1.22 in the right triphasic waveforms with a normal TBI of 1.15 on the left ABI of 1.22 with triphasic waveforms and a normal TBI of 1.08. Finally he saw Dr. Logan Bores who will follow him in for 2 months. At this point I do not think he felt that he needed a procedure on the right calcaneal bone. Dr. Earlene Plater is elected for broad-spectrum antibiotic The patient is still having pain in the right heel. He walks with a walker 2/3; wounds are generally smaller. He is tolerating his IV antibiotics. I believe this is vancomycin and ceftriaxone. We are still waiting for Oasis burn in terms of his out-of-pocket max which he should be meeting soon given the IV antibiotics, MRIs etc. I have asked him to check in on this. We are using silver collagen in the meantime the wounds look better 2/10; tolerating IV vancomycin and Rocephin. We are waiting to apply for Oasis. Although I am not really sure where he is in his out-of-pocket max. 2/17 started the first application of Oasis trilayer. Still on antibiotics. The wounds have generally look better. The area on the left has a little more surface slough requiring debridement 2/24; second application of Oasis trilayer. The wound surface granulation is generally look better. The area on the left with undermining laterally I think is come in a bit. 10/08/2020 upon evaluation today patient is here today for [employer] application #3. Fortunately he seems to be doing extremely well with regard to this and we are seeing a lot of new epithelial growth which is great news. Fortunately there is no signs of active infection at this time. 10/16/2020 upon evaluation today patient appears to be doing well with regard to his foot ulcers. Do believe the Oasis has been of benefit for him. I do not see any signs of infection right now which is great news and I think that he has a  lot of new  epithelial growth which is great to see as well. The patient is very pleased to hear all of this. I do think we can proceed with the Oasis trilayer #4 today. 3/18; not as much improvement in these areas on his heels that I was hoping. I did reapply trilateral Oasis today the tissue looks healthier but not as much fill in as I was hoping. 3/25; better looking today I think this is come in a bit the tissue looks healthier. Triple layer Oasis reapplied #6 4/1; somewhat better looking definitely better looking surface not as much change in surface area as I was hoping. He may be spending more time [place] on days then he needs to although he does have heel offloading boots. Triple layer Oasis reapplied #7 4/7; unfortunately apparently [facility] will not approve any further Oasis which is unfortunate since the patient did respond nicely both in terms of the condition of the wound bed as well as surface area. There is still some drainage coming from the wound but not a lot there does not appear to be any infection 4/15; we have been using Hydrofera Blue. He continues to have nice rims of epithelialization on the right greater than the left. The left the epithelialization is coming from the tip of his heel. There is moderate drainage. In this that concerns me about a total contact cast. There is no evidence of infection 4/29; patient has been using Hydrofera Blue with dressing changes. He has no complaints or issues today. 5/5; using Hydrofera Blue. I actually think that he looks marginally better than the last time I saw this 3 weeks ago. There are rims of epithelialization on the left thumb coming from the medial side on the right. Using Hydrofera Blue 5/12; using Hydrofera Blue. These continue to make improvements in surface area. His drainage was not listed as severe I therefore went ahead and put a cast on the left foot. Right foot we will continue to dress his previous 5/16; back for first  total contact cast change. He did not tolerate this particularly well cast injury on the anterior tibia among other issues. Difficulty sleeping. KYRIL, CHAMPIGNY ([id number]) [id number] Page 4 of 17 talked him about this in some detail and afterwards is elected to continue. I told him I would like to have a cast on for 3 weeks to see if this is going to help at all. I think he agreed 5/19; I think the wound is better. There is no tunneling towards his midfoot. The undermining medially also looks better. He has a rim of new skin distally. I think we are making progress here. The area on the left also continues to look somewhat better to me using Hydrofera Blue. He has a list of complaints about the cast but none of them seem serious 5/26; patient presents for 1 week follow-up. He has been using a total contact cast and tolerating this well. Hydrofera Blue is the main dressing used. He denies signs of infection. 6/2 Hydrofera Blue total contact cast on the left. These were large ulcers that formed in intensive care unit where the patient was recovering from COVID. May have had something to do with being ventilated in an upright positiono Pressors etc. We have been able to get the areas down considerably and a viable surface. There is some epithelialization in both sides. Note made of drainage 6/9; changed to [facility] last time because of drainage. He arrives with  better looking surfaces and dimensions on the left than the right. Paradoxically the right actually probes more towards his midfoot the left is largely close down but both of these look improved. Using a total contact cast on the left 6/16; complex wounds on his bilateral plantar heels which were initially pressure injury from a stay in the ICU with COVID. We have been using silver alginate most recently. His dimensions of come in quite dramatically however not recently. We have been putting the left foot in a  total contact cast 6/23; complex wounds on the bilateral plantar heels. I been putting the left in the cast paradoxically the area on the right is the one that is going towards closure at a faster rate. Quite a bit of drainage on the left. The patient went to see Dr. Logan Bores who said he was going to standby for skin grafts. I had actually considered sending him for skin grafts however he would be mandatorily off his feet for a period of weeks to months. I am thinking that the area on the right is going to close on its own the area on the left has been more stubborn even though we have him in a total contact cast 6/30; took him out of a total contact cast last week is the right heel seem to be making better progress than the left where I was placing the cast. We are using silver alginate. Both wounds are smaller right greater than left 7/12; both wounds look as though they are making some progress. We are using silver alginate. Heel offloading boots 7/26; very gradual progress especially on the right. Using silver alginate. He is wearing heel offloading boots 8/18; he continues to close these wounds down very gradually. Using silver alginate. The problem polymen being definitive about this is areas of what appears to be callus around the margins. This is not a 100% of the area but certainly sizable especially on the right 9/1; bilateral plantar feet wounds secondary to prolonged pressure while being ventilated for COVID-19 in an upright position. Essentially pressure ulcers on the bottom of his feet. He is made substantial progress using silver alginate. 9/14; bilateral plantar feet wounds secondary to prolonged pressure. Making progress using silver alginate. 9/29 bilateral plantar feet wounds secondary to prolonged pressure. I changed him to Iodoflex last week. MolecuLight showing reddened blush fluorescence 10/11; patient presents for follow-up. He has no issues or complaints today. He denies signs  of infection. He continues to use Iodoflex and antibiotic ointment to the wound beds. 10/27; 2-week follow-up. No evidence of infection. He has callus and thick dry skin around the wound margins we have been using Iodoflex and Bactroban which was in response to a moderate left MolecuLight reddish blush fluorescence. 11/10; 2-week follow-up. Wound margins again have thick callus however the measurements of the actual wound sites are a lot smaller. Everything looks reasonably healthy here. We have been using Iodoflex He was approved for prime matrix but I have elected to delay this given the improvement in the surface area. Hopefully I will not regret that decision as were getting close to the end of the year in terms of insurance payment 12/8; 2-week follow-up. Wounds are generally smaller in size. These were initially substantial wounds extending into the forefoot all the way into the heel on the bilateral plantar feet. They are now both located on the plantar heel distal aspect both of these have a lot of callus around the wounds I used a #5  curette to remove this on the right and the left also some subcutaneous debris to try and get the wound edges were using Iodoflex. He has heel offloading shoe 12/22; 2-week follow-up. Not really much improvement. He has thick callus around the outer edges of both wounds. I remove this there is some nonviable subcutaneous tissue as well. We have been using Iodoflex. Her intake nurse and myself spontaneously thought of a total contact cast I went back in May. At that time we really were not seeing much of an improvement with a cast although the wound was in a much different situation I would like to retry this in 2 weeks and I discussed this with the patient 08/12/2021; the patient has had some improvement with the Iodoflex. The the area on the left heel plantar more improved than the right. I had to put him in a total contact cast on the left although I decided to  put that off for 2 weeks. I am going to change his primary dressing to silver collagen. I think in both areas he has had some improvement most of the healing seems to be more proximal in the heel. The wounds are in the mid aspect. A lot of thick callus on the right heel however. 1/19; we are using silver collagen on both plantar heel areas. He has had some improvement today. The left did not require any debridement. He still had some eschar on the right that was debrided but both seem to have contracted. I did not put it total contact cast on him today 2/2 we have been using silver collagen. The area on the right plantar heel has areas that appear to be epithelialized interspersed with dry flaking callus and dry skin. I removed this. This really looks better than on the other side. On the left still a large area with raised edges and debris on the surface. 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 He has no issues or complaints today. 2/9; not much change in the left foot wound with 1 week of a cast we are using silver collagen. Silver collagen on the right side. The right side has been the better wound surface. We will reapply the total contact cast on the left 2/16; not much improvement on either side I been using silver collagen with a total contact cast on the left. I'm changing the Hydrofera Blue still with a total contact cast on the left 2/23; some improvement on both sides. Disappointing that he has thick callus around the area that we are putting in a total contact cast on the left. We've been using Hydrofera Blue on both wound areas. This is a man who at essentially pressure ulcers in addition to ischemia caused by medications to support his blood pressure (pressors) in the ICU. He was being ventilated in the standing position for severe Covid. A Shiley the  wounds extended across his entire foot but are now localized to his plantar heels bilaterally. We have made progress however neither areas healed. I continue to think the total contact cast is helped albeit painstakingly slowly. He has never wanted a plastic surgery consult although I don't know that they would be interested in grafting in area in this location. 10/07/2021: Continued improvement bilaterally. There is still some callus around the left wound, despite the total contact cast. He has some increased  pain in his right midfoot around 1 particular area. This has been painful in the past but seems to be a little bit worse. When his cast was removed today, there was an area on the heel of the left foot that looks a bit macerated. He is also complaining of pain in his left thigh and hip which he thinks is secondary to the limb length discrepancy caused by the cast. 10/14/2021: He continues to improve. A little bit less callus around the left wound. He continues to endorse pain in his right midfoot, but this is not as significant as it was last week. The maceration on his left heel is improved. 10/21/2021: Continued improvement to both wounds. The maceration on his left heel is no longer evident. Less callus bilaterally. Epithelialization progressing. 10/28/2021: Significant improvement this week. The right sided wound is nearly closed with just a small open area at the middle. No maceration seen on the left heel. Continued epithelialization on both sides. No concern for infection. 11/04/2021: T oday, the wounds were measured a little bit differently and come out as larger, but I actually think they are about the same to potentially even smaller, particularly on the left. He continues to accumulate some callus on the right. 11/11/2021: T oday, the patient is expressing some concern that the left wound, despite being in the total contact cast, is not progressing at the same rate as the 1 on the right. He is  interested in trying a week without the cast to see how the wound does. The wounds are roughly the same size as last week, with the right ROLLEY, ROSARIO ([id number]) [id number] Page 5 of 17 perhaps being a little bit smaller. He continues to build up callus on both sites. 11/18/2021: Last week, I permitted the patient to go without his total contact cast, just to see if the cast was really making any difference. Today, both wounds have deteriorated to some extent, suggesting that the cast is providing benefit, at least on the left. Both are larger and have accumulated callus, slough, and other debris. 11/26/2021: I debrided both wounds quite aggressively last week in an effort to stimulate the healing cascade. This appears to have been effective as the left sided wound is a full centimeter shorter in length. Although the right was measured slightly larger, on inspection, it looks as though an area of epithelialized tissue was included in the measurements. We have been using PolyMem Ag on the wound surfaces with a total contact cast to the left. 12/02/2021: It appears that the intake personnel are including epithelialized tissue in his wound measurements; the right wound is almost completely epithelialized; there is just a crater at the proximal midfoot with some open areas. On the left, he has built up some callus, but the overall wound surface looks good. There is some senescent skin around the wound margin. He has been in PolyMem Ag bilaterally with a total contact cast on the left. 12/09/2021: The right wound is nearly closed; there is just a small open area at the mid calcaneus. On the left, the wound is smaller with minimal callus buildup. No significant drainage. 12/16/2021: The right calcaneal wound remains minimally open at the mid calcaneus; the rest has epithelialized. On the left, the wound is also a little bit smaller. There is some senescent tissue on the periphery.  He is getting his first application of a trial skin substitute called Vendaje today. 12/23/2021: The wound on his right calcaneus is nearly closed;  there is just a small area at the most distal aspect of the calcaneus that is open. On the left, the area where we applied to the skin substitute has a healthier-looking bed of granulation tissue. The wound dimensions are not significantly different on this side but the wound surface is improved. 12/30/2021: The wound on the right calcaneus has not changed significantly aside from some accumulation of callus. On the left, the open area is smaller and continues to have an improved surface. He continues to accumulate callus around the wound. He is here for his third application of Vendaje. 01/06/2022: The right calcaneal wound is down to just a couple of millimeters. He continues to accumulate periwound callus. He unfortunately got his cast wet earlier in the week and his left foot is macerated, resulting in some superficial skin loss just distal to the open wound. The open wound itself, however, is much smaller and has a healthier appearing surface. He is here for his fourth application of Vendaje. 01/13/2022: The right calcaneal wound is about the same. Unfortunately, once again, his cast got wet and his foot is again macerated. This is caused the left calcaneal wound to enlarge. He is here for his fifth application of Vendaje. 01/20/2022: The right calcaneal wound is very small. There is some periwound callus accumulation. He purchased a new cast protector last week and this has been effective in avoiding the maceration that has been occurring on the left. The left calcaneal wound is narrower and has a healthy and viable-appearing surface. He is here for his 6 application of Vendaje. 01/27/2022: The right calcaneal wound is down to just a pinhole. There is some periwound slough and callus. On the left, the wound is narrower and shorter by about a centimeter. The  surface is robust and viable-appearing. Unfortunately, the rep for the trial skin substitute product did not provide any for [place] to use today. 02/04/2022: The right calcaneal wound remains unchanged. There is more accumulated callus. On the left, although the intake nurse measured it a little bit longer, it looks about the same to me. The surface has a layer of slough, but underneath this, there is good granulation tissue. 02/10/2022: The right calcaneus wound is nearly closed. There is still some callus that builds up around the site. The left side looks about the same in terms of dimensions, but the surface is more robust and vital-appearing. 02/16/2022: The area of the right calcaneus that was nearly closed last week has closed, but there is a small opening at the mid foot where it looks like some moisture got retained and caused some reopening. The left foot wound is narrower and shallower. Both sites have a fair amount of periwound callus and eschar. 02/24/2022: The small midfoot opening on the right calcaneus is a little bit smaller today. The left foot wound is narrower and shallower. He continues to accumulate periwound callus. No concern for infection. 03/01/2022: The patient came to clinic early because he showered and got his cast wet. Fortunately, there is no significant maceration to his foot but the callus softened and it looks like the wound on his left calcaneus may be a little bit wider. The wound on his right calcaneus is just a narrow slit. Continued accumulation of periwound callus bilaterally. 03/08/2022: The wound on his right calcaneus is very nearly closed, just a small pinpoint opening under a bit of eschar; the left wound has come in quite a bit, as well. It is narrower and shorter than  at our last visit. Still with accumulated callus and eschar bilaterally. 03/17/2022: The right calcaneal wound is healed. The left wound is smaller and the surface itself is very clean, but there is  some blue-green staining on the periwound callus, concerning for Pseudomonas aeruginosa. 03/23/2022: The right calcaneal wound remains closed. The left wound continues to contract. No further blue-green staining. Small amount of callus and slough accumulation. 03/28/2022: He came in early today because he had gotten his cast wet. On inspection, the wound itself did not get wet or macerated, just a little bit of the forefoot. The wound itself is basically unchanged. 04/07/2022: The right foot wound remains closed. The left wound is the smallest that I have seen it to date. It is narrower and shorter. It still continues to accumulate slough on the surface. 04/15/2022: There is a band of epithelium now dividing the small left plantar foot wound in 2. There is still some slough on the surface. 04/21/2022: The wound continues to narrow. Just a little bit of slough on the surface. He seems to be responding well to endoform. 04/28/2022: Continued slow contraction of the wound. There is a little slough on the surface and some periwound callus. We have been using endoform and total contact cast. 05/05/2022: The wound appears to have stalled. There is slough and some periwound eschar/callus. No concern for infection, however. 05/12/2022: Unfortunately, his right foot has reopened. It is located at the most posterior aspect of his surgical incision. The area was noted to have drainage coming from it when his padding was removed today. Underneath some callus and senescent skin, there is an opening. No purulent drainage or malodor. On the left foot, the wound is again unchanged. There is some light blue staining on the callus, but no malodor or purulent drainage. 10/13; right and left heel remanence of extensive plantar foot wounds. These are better than I remember by quite a big margin however he is still left with wounds on the left plantar heel and the right plantar heel. Been using endoform bilaterally. A culture was  done that showed apparently Pseudomonas but we are still waiting for the [facility] antibiotic to use gentamicin today. He is still very active by description I am not sure about the offloading of his noncasted right foot 10/20; both wounds right and left heel debrided not much change from last week. [name] Echevaria has arrived which is linezolid, gentamicin and ciprofloxacin we will use this with endoform. T contact cast on the left JESUSALBERTO, GREISS ([id number]) [id number].pdf Page 6 of 17 06/02/2022: Both wounds are smaller today. There is still a fair amount of callus buildup around the right foot ulcer. The left is more superficial and nearly flush with the surrounding tissues. Also with slough and eschar buildup. 06/10/2022: The right sided wound appears to be nearly closed, if not completely so, although it is somewhat difficult to tell given the abnormal tissue and scarring in his foot. There is a fair amount of callus and crust accumulation. On the left, the wound looks about the same, again with callus and slough. He has an appointment next Thursday with Dr. Annamary Rummage in podiatry; I am hopeful that there may be some reconstructive options available for Mr. Simonetti. 06/16/2022: Both wounds have some eschar and callus accumulation. The right sided wound is extremely narrow and barely open; the left is narrower than last week. There is a little bit of slough. He has his appointment in podiatry later today. 06/23/2022: The patient met  with Dr. Annamary Rummage last week and unfortunately, there are no reconstructive options that he believes would be helpful. He did order an MRI to evaluate for osteomyelitis and fortunately, none was seen. The left sided wound is a little bit shorter and narrower today. The right sided wound is about the same. There is callus and eschar accumulation bilaterally. 06/29/2022: Both feet have improved from last week. There is epithelium making a valiant  effort to creep across the surface on the left. The right side looks like it got a little dry and the deep crevasse in his midfoot has cracked. Both have eschar and there is some slough on the left. 07/07/2022: Both feet have improved. There is epithelium completely covering the calcaneus on the right with just a small opening in the crevasse in his midfoot. On the left, the open area of tissue is smaller but he continues to build up callus/eschar and slough. 07/15/2022: The opening in the midfoot on the right is about the same size, covered with eschar and a little bit of slough. The open portion of the left wound is narrower and shorter with a bit of slough buildup. He admits to being on his feet more than recommended. 12/14; as far as I can tell everything on the right foot is closed. There is some eschar I removed some of this I cannot identify any open wound here. As usual this will be a very vulnerable area going forward. On the left this looks really quite healthy. I was pleasantly surprised to see how good this looked. Wound is certainly smaller and there appears to be healthy epithelialization. He has been using Promogran on the right and endoform on the left. He has been offloading the right foot with a heel offloading boot and he has a running shoe on the right foot 08/04/2022: The right foot remains closed. He has a thick cushioned insole in his sneaker. The left sided wound is smaller with just some slough and eschar accumulation. He is wearing the heel off loader on this foot. 08/15/2022: The right foot remains closed. The left wound has narrowed further. There is some slough and eschar accumulation. 08/25/2022: We put him in a peg assist shoe insert and as a result, he has more epithelialization of the ulcer with minimal slough and eschar accumulation. 09/01/2022: The wound is smaller by about half this week. Still with some slough on the surface. The peg assist shoe seems to be doing a  remarkable job of adequately offloading the site. 09/08/2022: There is a little bit more epithelium coming in. There is some slough and callus buildup. 09/16/2022: The wound measures about the same size, but the epithelium that has grown in looks more robust and stronger. There is some slough and callus buildup. 09/23/2022: The wound remains about the same size. The skin edges are looking rather senescent. 09/29/2022: The aggressive debridement I performed last week seems to have been effective. The wound is smaller and has significantly less slough accumulation. 10/06/2022: There has been quite a bit of epithelialization since last week. There are still some open areas with slough accumulation. There is some callus buildup around the perimeter. 10/13/2022: Continued improvement. Minimal callus accumulation. 3/14; patient presents for follow-up. He has been using endoform to the wound bed without issues. He is using a surgical shoe with peg assist for offloading. 10/27/2022: The wound dimensions are stable. There is some senescent skin accumulation around the perimeter. 11/04/2022: Last week I performed a very aggressive debridement in  an effort to stimulate the healing cascade. As has been the case when I have done this before, the wound has responded well. There has been epithelialization and contraction of the wound. There are just a couple of small open areas. 11/10/2022: Unfortunately, his foot got wet secondary to sweating and there has been some breakdown of the tissue, particularly the skin just adjacent to the main ulcer. 11/18/2022: We continue to struggle with moisture-related tissue breakdown around the perimeter of his wound. This has caused the thin epithelium that had formed on the surface to disintegrate. The underlying surface of the wound has good granulation tissue. 11/25/2022: The edges of the wound are much less macerated, but the surface is actually looking a little bit dry. There is slough  accumulation. No obvious signs of infection. 12/01/2022: The wound edges are more macerated and broken down, making the wound larger. The surface has some slough on it. 12/08/2020: The wound looks better this week. There is significantly less maceration and moisture-related tissue breakdown. There is some slough on the wound surface. 12/15/2022: The wound continues to improve. There is almost no moisture-related tissue breakdown. There is epithelium beginning to fill in again from the edges. Light slough on the wound surface. 12/22/2022: More epithelium has filled him from around the edges. There is some slough on the surface. No evidence of moisture-related tissue breakdown. 01/05/2023: There has been more epithelialization, particularly at the posterior calcaneal aspect of the wound. There has been a little bit of moisture accumulation with minimal maceration. 01/12/2023: More epithelium has filled in. There is very minimal accumulation of slough. 01/20/2023: The wound is measuring a little bit smaller today. It did measure deeper, but this appears to be secondary to some heaped up senescent skin around the edges. The epithelium that has been present at the posterior aspect of the wound seems to be a bit more robust with greater integrity. 01/26/2023: He reports intense itching to the skin around the wound and there has been some breakdown here. It looks fungal in nature. The wound itself looks pretty good with improved epithelialization with just some slough buildup. He has a little callus along the wound edges. JAXXON, DATEMA ([id number]) [phone].pdf Page 7 of 17 02/02/2023: He has been treating the periwound skin with an over-the-counter athlete's foot medication and notes significant improvement in his pruritus. The skin looks better here, as well. The wound continues to epithelialize. There is light slough accumulation. 02/10/2023: The periwound skin still looks fairly angry.  He reports multiple small blisters that have opened up. He is not having the intense itching that he had prior, however. There has been more epithelialization at the calcaneus and there is minimal slough accumulation. 02/16/2023: The periwound skin is improving. An online review of dermatology rash images suggest that this may be hand-foot-and-mouth disease, isolated to the feet; the patient says he actually had this as a child. The calcaneus continues to epithelialize and the tissue was quite robust here. Very minimal slough accumulation on the open portion of the wound with a little bit of periwound callus buildup. 03/03/2023: The wound measurements are about the same, but there is more epithelium making its way to the surface. There is a little bit of crust and eschar around the edges and minimal slough on the surface. 03/09/2023: His dressing was applied rather haphazardly and the drawtex was not applied. The Optifoam was also reversed such that the orange side was in contact with his bare skin. He has some maceration  around the wound edges, but this did not appear to result in any tissue breakdown. There is a little bit of slough on the surface and minimal eschar around the edges. 03/15/2023: The wound measured narrower today. There is some slough accumulation on the surface and some eschar around the edges. 03/23/2023: The length of the wound remains stable, but it continues to narrow. The skin that has covered the posterior aspect of the calcaneus is more robust and has better tensile integrity. Minimal slough and eschar accumulation. 03/30/2023: The wound length has decreased. This seems to be primarily due to more epithelium covering the posterior aspect of the site. Minimal slough and eschar buildup. 04/07/2023: The wound measured a little bit smaller again today. There is minimal slough accumulation with just a little bit of eschar and callus around the edges. 04/13/2023: The lateral aspect of his  wound is starting to epithelialize. There is very minimal slough on the surface. Moisture control is good. 04/20/2023: Continued, albeit extremely slow, encroachment of epithelium as of the wound surface. Minimal slough. Moisture balance is good. 04/27/2023: More epithelium is filling in from the heel area. There is a little bit of slough on the surface with some periwound callus and eschar. He did note a little bit of drainage coming from his right foot and there is a small crack in the skin extending medially from the main site of his prior ulceration. 05/04/2023: The wounds are stable this week. No significant change. There is a little bit of callus buildup around the edges of each site and some thin slough on the surface on the left foot. 05/11/2023: The skin on the right heel is filling in further. There is a little bit of slough accumulation on both surfaces along with periwound callus accumulation. 05/18/2023: Both wounds are smaller today. The right foot is down to just a sliver of opening. 05/25/2023: Epithelium continues to advance from the calcaneal area towards the midfoot on the left. On the right, there is tiniest crack of an opening that remains. 06/01/2023: No significant change bilaterally. He has built up quite a bit of callus on the calcaneal surface of the right foot. 06/15/2023: He missed his appointment last week. The wounds measured larger today. There is some slough and eschar accumulation on both wounds. He was discussed in the [facility] wound camp last week. The consensus was in favor of proceeding with plastic surgery intervention but they also suggested repeating the MRI imaging of his heels to ensure no osteomyelitis present. 06/22/2023: The wounds are basically stable. His MRI is scheduled for tomorrow and his surgery for a week from tomorrow, 22 November. 12/9; patient I know from some months ago. 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 He  saw Dr. [name] aylor from plastic surgery who is planning surgery. He is received his okay from pulmonology and he sees cardiology on Wednesday. He has been using silver alginate to both wound areas. The area on the left heel actually looks fairly good healthy looking granulation some epithelialization towards the tip of the heel. Right heel is almost completely eschared. 07/28/2023: No significant change to the wounds. The MRIs have finally been read and there is no osteomyelitis seen in either heel. His surgery had to be rescheduled due to concerns from anesthesiology about his post-COVID cardiopulmonary status. He has been cleared by pulmonology, but cardiology wants to do some additional testing in advance of clearing him for surgery. I understand he is also planning  to go ahead with bariatric surgery. Electronic Signature(s) Signed: 07/28/2023 10:26:47 AM By: [name] MD FACS Entered By: [name] on 07/28/2023 05:17:47 -------------------------------------------------------------------------------- Physical Exam Details Patient Name: Date of Service: [name], TO [name] E. 07/28/2023 7:45 A M Medical Record Number: [id number] Patient Account Number: [id number] Date of Birth/Sex: Treating RN: 1976/05/23 (47 y.o. M) Primary Care Provider: Dorinda Hill Other Clinician: Referring Provider: Treating Provider/Extender: Jana Hakim in Treatment: 158 Constitutional Slightly hypertensive. . . . no acute distress. JJUAN, MASTROGIOVANNI ([id number]) [phone].pdf Page 8 of 17 Notes 07/28/2023: No significant change to either of the wounds. There is heavy eschar accumulation on the right, in particular. Electronic Signature(s) Signed: 07/28/2023 10:26:47 AM By: [name] MD FACS Entered By: [name] on 07/28/2023 05:25:30 -------------------------------------------------------------------------------- Physician Orders  Details Patient Name: Date of Service: [name], TO [name] E. 07/28/2023 7:45 A M Medical Record Number: [id number] Patient Account Number: [id number] Date of Birth/Sex: Treating RN: [birth year] (47 y.o. [name] Primary Care Provider: Dorinda Hill Other Clinician: Referring Provider: Treating Provider/Extender: Jana Hakim in Treatment: 158 The following information was scribed by: Zenaida Deed The information was scribed for: [name] Verbal / Phone Orders: No Diagnosis Coding ICD-10 Coding Code Description [phone] Non-pressure chronic ulcer of other part of right foot with fat layer exposed L97.522 Non-pressure chronic ulcer of other part of left foot with fat layer exposed Follow-up Appointments ppointment in 2 weeks. - or 3 weeks with Dr. Lady Gary Return A Anesthetic Wound #2 Left Calcaneus (In clinic) Topical Lidocaine 4% applied to wound bed - In clinic Bathing/ Shower/ Hygiene May shower and wash wound with soap and water. Off-Loading Other: - keep pressure off of the bottom of your feet. Elevate legs throughout the day. Use the Shoe with the PegAssist off-loading insole Additional Orders / Instructions Follow Nutritious Diet - Try to get 70-100 grams of Protein a day+ Coupons for Juven sent with patient. Other: - Vitamin C 500 mg per day to start, then increase to 500mg  twice a day. Also consider adding a zinc supplement. Juven Shake 1-2 times daily. Wound Treatment Wound #2 - Calcaneus Wound Laterality: Left Cleanser: Normal Saline (Generic) 1 x Per Day/Other:until healed Discharge Instructions: Cleanse the wound with Normal Saline prior to applying a clean dressing using gauze sponges, not tissue or cotton balls. Cleanser: Wound Cleanser (Generic) 1 x Per Day/Other:until healed Discharge Instructions: Cleanse the wound with wound cleanser prior to applying a clean dressing using gauze sponges, not tissue or cotton  balls. Peri-Wound Care: Ketoconazole Cream 2% (Generic) 1 x Per Day/Other:until healed Discharge Instructions: Apply Ketoconazole mixed with zinc to the periwound Peri-Wound Care: Zinc Oxide Ointment 30g tube (Generic) 1 x Per Day/Other:until healed Discharge Instructions: Apply Zinc Oxide to periwound with each dressing change Topical: Gentamicin (Generic) 1 x Per Day/Other:until healed Discharge Instructions: apply to the wound bed Topical: Ketoconazole Cream 2% 1 x Per Day/Other:until healed Discharge Instructions: Apply Ketoconazole to periwound CHRISTIANO, LEDMAN ([id number]) [id number] Page 9 of 17 Prim Dressing: Maxorb Extra Ag+ Alginate Dressing, 4x4.75 (in/in) (Generic) 1 x Per Day/Other:until healed ary Discharge Instructions: Apply to wound bed as instructed Secondary Dressing: Drawtex 4x4 in (Generic) 1 x Per Day/Other:until healed Discharge Instructions: Apply over primary dressing as directed. Secondary Dressing: Optifoam Non-Adhesive Dressing, 4x4 in (Generic) 1 x Per Day/Other:until healed Discharge Instructions: As needed- Apply over primary dressing as directed. Secondary Dressing: Woven Gauze Sponge, Non-Sterile 4x4 in (Generic) 1 x Per Day/Other:until  healed Discharge Instructions: Apply over primary dressing as directed. Secured With: 60M Medipore H Soft Cloth Surgical T ape, 4 x 10 (in/yd) (Generic) 1 x Per Day/Other:until healed Discharge Instructions: Secure with tape as directed. Compression Wrap: Kerlix Roll 4.5x3.1 (in/yd) (Generic) 1 x Per Day/Other:until healed Discharge Instructions: Apply Kerlix and Coban compression as directed. Wound #5 - Foot Wound Laterality: Plantar, Right Peri-Wound Care: Ketoconazole Cream 2% 1 x Per Day/30 Days Discharge Instructions: Apply Ketoconazole to periwound as needed Peri-Wound Care: Zinc Oxide Ointment 30g tube 1 x Per Day/30 Days Discharge Instructions: Apply Zinc Oxide to periwound with each  dressing change Topical: Gentamicin 1 x Per Day/30 Days Discharge Instructions: As directed by physician Prim Dressing: Maxorb Extra Ag+ Alginate Dressing, 4x4.75 (in/in) 1 x Per Day/30 Days ary Discharge Instructions: Apply to wound bed as instructed Secondary Dressing: Drawtex 4x4 in 1 x Per Day/30 Days Discharge Instructions: As needed -Apply over primary dressing as directed. Secondary Dressing: Optifoam Non-Adhesive Dressing, 4x4 in 1 x Per Day/30 Days Discharge Instructions: Apply over primary dressing as directed. Secured With: [employer], 4.5x3.1 (in/yd) 1 x Per Day/30 Days Discharge Instructions: Secure with Kerlix as directed. Electronic Signature(s) Signed: 07/28/2023 10:26:47 AM By: [name] MD FACS Signed: 07/28/2023 12:52:17 PM By: Zenaida Deed RN, BSN Entered By: Zenaida Deed on 07/28/2023 04:49:08 -------------------------------------------------------------------------------- Problem List Details Patient Name: Date of Service: [name], TO [name] E. 07/28/2023 7:45 A M Medical Record Number: [id number] Patient Account Number: [id number] Date of Birth/Sex: Treating RN: Nov 15, 1975 (47 y.o. [name] Primary Care Provider: Dorinda Hill Other Clinician: Referring Provider: Treating Provider/Extender: Jana Hakim in Treatment: 158 Active Problems ICD-10 Encounter Code Description Active Date MDM Diagnosis L97.512 Non-pressure chronic ulcer of other part of right foot with fat layer exposed 04/27/2023 No Yes L97.522 Non-pressure chronic ulcer of other part of left foot with fat layer exposed 09/03/2020 No Yes NICHOLIS, HENNIGAN ([id number]) [phone].pdf Page 10 of 17 Inactive Problems ICD-10 Code Description Active Date Inactive Date L89.893 Pressure ulcer of other site, stage 3 07/15/2020 07/15/2020 M62.81 Muscle weakness (generalized) 07/15/2020 07/15/2020 I10 Essential (primary) hypertension  07/15/2020 07/15/2020 M86.171 Other acute osteomyelitis, right ankle and foot 09/03/2020 09/03/2020 Resolved Problems Electronic Signature(s) Signed: 07/28/2023 10:26:47 AM By: [name] MD FACS Entered By: [name] on 07/28/2023 05:15:56 -------------------------------------------------------------------------------- Progress Note Details Patient Name: Date of Service: [name], [name] 07/28/2023 7:45 A M Medical Record Number: [id number] Patient Account Number: [id number] Date of Birth/Sex: Treating RN: [birth year] (47 y.o. M) Primary Care Provider: Dorinda Hill Other Clinician: Referring Provider: Treating Provider/Extender: Jana Hakim in Treatment: 158 Subjective Chief Complaint Information obtained from Patient Bilateral Plantar Foot Ulcers History of Present Illness (HPI) Wounds are12/03/2020 upon evaluation today patient presents for initial inspection here in our clinic concerning issues he has been having with the bottoms of his feet bilaterally. He states these actually occurred as wounds when he was hospitalized for 5 months secondary to Covid. He was apparently with tilting bed where he was in an upright position quite frequently and apparently this occurred in some way shape or form during that time. Fortunately there is no sign of active infection at this time. No fevers, chills, nausea, vomiting, or diarrhea. With that being said he still has substantial wounds on the plantar aspects of his feet Theragen require quite a bit of work to get these to heal. He has been using Santyl currently though that is been problematic both  in receiving the medication as well as actually paid for it as it is become quite expensive. Prior to the experience with Covid the patient really did not have any major medical problems other than hypertension he does have some mild generalized weakness following the Covid experience. 07/22/2020 on evaluation today  patient appears to be doing okay in regard to his foot ulcers I feel like the wound beds are showing signs of better improvement that I do believe the Iodoflex is helping in this regard. With that being said he does have a lot of drainage currently and this is somewhat blue/green in nature which is consistent with Pseudomonas. I do think a culture today would be appropriate for [place] to evaluate and see if that is indeed the case I would likely start him on antibiotic orally as well he is not allergic to Cipro knows of no issues he has had in the past 12/21; patient was admitted to the clinic earlier this month with bilateral presumed pressure ulcers on the bottom of his feet apparently related to excessive pressure from a tilt table arrangement in the intensive care unit. Patient relates this to being on ECMO but I am not really sure that is exactly related to that. I must say I have never seen anything like this. He has fairly extensive full-thickness wounds extending from his heel towards his midfoot mostly centered laterally. There is already been some healing distally. He does not appear to have an arterial issue. He has been using gentamicin to the wound surfaces with Iodoflex to help with ongoing debridement 1/6; this is a patient with pressure ulcers on the bottom of his feet related to excessive pressure from a standing position in the intensive care unit. He is complaining of a lot of pain in the right heel. He is not a diabetic. 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 We have been using Iodoflex to help prepare the surfaces of both wounds for an advanced treatment product. He is nonambulatory spending most of his time in a wheelchair I have asked him not to propel the wheelchair with his heels 1/13; in general his wounds look better not much surface area change we have been using Iodoflex as of last week. I did an x-ray of the right heel as the patient was complaining  of pain. I had some thoughts about a stress fracture perhaps Achilles tendon problems however what it showed was erosive changes along the inferior aspect of the calcaneus he now has a MRI booked for 1/20. 1/20; in general his wounds continue to be better. Some improvement in the large narrow areas proximally in his foot. He is still complaining of pain in the right BRECKER, FEUERBORN ([id number]) [id number].pdf Page 11 of 17 heel and tenderness in certain areas of this wound. His MRI is tonight. I am not just looking for osteomyelitis that was brought up on the x-ray I am wondering about stress fractures, tendon ruptures etc. He has no such findings on the left. Also noteworthy is that the patient had critical illness neuropathy and some of the discomfort may be actual improvement in nerve function I am just not sure. These wounds were initially in the setting of severe critical illness related to COVID-19. He was put in a standing position. He may have also been on pressors at the point contributing to tissue ischemia. By his description at some point these wounds were grossly necrotic extending proximally up into the Achilles part of  his heel. I do not know that I have ever really seen pictures of them like this although they may exist in epic We have ordered Tri layer Oasis. I am trying to stimulate some granulation in these areas. This is of course assuming the MRI is negative for infection 1/27; since the patient was last here he saw Dr. Earlene Plater of infectious disease. He is planned for vancomycin and ceftriaxone. Prior operative culture grew MSSA. Also ordered baseline lab work. He also ordered arterial studies although the ABIs in our clinic were normal as well as his clinical exam these were normal I do not think he needs to see vascular surgery. His ABIs at the [facility] were 1.22 in the right triphasic waveforms with a normal TBI of 1.15 on the left ABI of 1.22 with triphasic  waveforms and a normal TBI of 1.08. Finally he saw Dr. Logan Bores who will follow him in for 2 months. At this point I do not think he felt that he needed a procedure on the right calcaneal bone. Dr. Earlene Plater is elected for broad-spectrum antibiotic The patient is still having pain in the right heel. He walks with a walker 2/3; wounds are generally smaller. He is tolerating his IV antibiotics. I believe this is vancomycin and ceftriaxone. We are still waiting for Oasis burn in terms of his out-of-pocket max which he should be meeting soon given the IV antibiotics, MRIs etc. I have asked him to check in on this. We are using silver collagen in the meantime the wounds look better 2/10; tolerating IV vancomycin and Rocephin. We are waiting to apply for Oasis. Although I am not really sure where he is in his out-of-pocket max. 2/17 started the first application of Oasis trilayer. Still on antibiotics. The wounds have generally look better. The area on the left has a little more surface slough requiring debridement 2/24; second application of Oasis trilayer. The wound surface granulation is generally look better. The area on the left with undermining laterally I think is come in a bit. 10/08/2020 upon evaluation today patient is here today for [employer] application #3. Fortunately he seems to be doing extremely well with regard to this and we are seeing a lot of new epithelial growth which is great news. Fortunately there is no signs of active infection at this time. 10/16/2020 upon evaluation today patient appears to be doing well with regard to his foot ulcers. Do believe the Oasis has been of benefit for him. I do not see any signs of infection right now which is great news and I think that he has a lot of new epithelial growth which is great to see as well. The patient is very pleased to hear all of this. I do think we can proceed with the Oasis trilayer #4 today. 3/18; not as much improvement in these  areas on his heels that I was hoping. I did reapply trilateral Oasis today the tissue looks healthier but not as much fill in as I was hoping. 3/25; better looking today I think this is come in a bit the tissue looks healthier. Triple layer Oasis reapplied #6 4/1; somewhat better looking definitely better looking surface not as much change in surface area as I was hoping. He may be spending more time [place] on days then he needs to although he does have heel offloading boots. Triple layer Oasis reapplied #7 4/7; unfortunately apparently [facility] will not approve any further Oasis which is unfortunate since the  patient did respond nicely both in terms of the condition of the wound bed as well as surface area. There is still some drainage coming from the wound but not a lot there does not appear to be any infection 4/15; we have been using Hydrofera Blue. He continues to have nice rims of epithelialization on the right greater than the left. The left the epithelialization is coming from the tip of his heel. There is moderate drainage. In this that concerns me about a total contact cast. There is no evidence of infection 4/29; patient has been using Hydrofera Blue with dressing changes. He has no complaints or issues today. 5/5; using Hydrofera Blue. I actually think that he looks marginally better than the last time I saw this 3 weeks ago. There are rims of epithelialization on the left thumb coming from the medial side on the right. Using Hydrofera Blue 5/12; using Hydrofera Blue. These continue to make improvements in surface area. His drainage was not listed as severe I therefore went ahead and put a cast on the left foot. Right foot we will continue to dress his previous 5/16; back for first total contact cast change. He did not tolerate this particularly well cast injury on the anterior tibia among other issues. Difficulty sleeping. I talked him about this in some detail and  afterwards is elected to continue. I told him I would like to have a cast on for 3 weeks to see if this is going to help at all. I think he agreed 5/19; I think the wound is better. There is no tunneling towards his midfoot. The undermining medially also looks better. He has a rim of new skin distally. I think we are making progress here. The area on the left also continues to look somewhat better to me using Hydrofera Blue. He has a list of complaints about the cast but none of them seem serious 5/26; patient presents for 1 week follow-up. He has been using a total contact cast and tolerating this well. Hydrofera Blue is the main dressing used. He denies signs of infection. 6/2 Hydrofera Blue total contact cast on the left. These were large ulcers that formed in intensive care unit where the patient was recovering from COVID. May have had something to do with being ventilated in an upright positiono Pressors etc. We have been able to get the areas down considerably and a viable surface. There is some epithelialization in both sides. Note made of drainage 6/9; changed to [facility] last time because of drainage. He arrives with better looking surfaces and dimensions on the left than the right. Paradoxically the right actually probes more towards his midfoot the left is largely close down but both of these look improved. Using a total contact cast on the left 6/16; complex wounds on his bilateral plantar heels which were initially pressure injury from a stay in the ICU with COVID. We have been using silver alginate most recently. His dimensions of come in quite dramatically however not recently. We have been putting the left foot in a total contact cast 6/23; complex wounds on the bilateral plantar heels. I been putting the left in the cast paradoxically the area on the right is the one that is going towards closure at a faster rate. Quite a bit of drainage on the left. The patient went to see Dr.  Logan Bores who said he was going to standby for skin grafts. I had actually considered sending him for skin grafts however he  would be mandatorily off his feet for a period of weeks to months. I am thinking that the area on the right is going to close on its own the area on the left has been more stubborn even though we have him in a total contact cast 6/30; took him out of a total contact cast last week is the right heel seem to be making better progress than the left where I was placing the cast. We are using silver alginate. Both wounds are smaller right greater than left 7/12; both wounds look as though they are making some progress. We are using silver alginate. Heel offloading boots 7/26; very gradual progress especially on the right. Using silver alginate. He is wearing heel offloading boots 8/18; he continues to close these wounds down very gradually. Using silver alginate. The problem polymen being definitive about this is areas of what appears to be callus around the margins. This is not a 100% of the area but certainly sizable especially on the right 9/1; bilateral plantar feet wounds secondary to prolonged pressure while being ventilated for COVID-19 in an upright position. Essentially pressure ulcers on the bottom of his feet. He is made substantial progress using silver alginate. 9/14; bilateral plantar feet wounds secondary to prolonged pressure. Making progress using silver alginate. 9/29 bilateral plantar feet wounds secondary to prolonged pressure. I changed him to Iodoflex last week. MolecuLight showing reddened blush fluorescence 10/11; patient presents for follow-up. He has no issues or complaints today. He denies signs of infection. He continues to use Iodoflex and antibiotic ointment to the wound beds. 10/27; 2-week follow-up. No evidence of infection. He has callus and thick dry skin around the wound margins we have been using Iodoflex and Bactroban which was in response to a  moderate left MolecuLight reddish blush fluorescence. 11/10; 2-week follow-up. Wound margins again have thick callus however the measurements of the actual wound sites are a lot smaller. Everything looks reasonably healthy here. We have been using Iodoflex He was approved for prime matrix but I have elected to delay this given the improvement in the surface area. Hopefully I will not regret that decision as were getting close to the end of the year in terms of insurance payment ERLE, MANTOOTH ([id number]) [id number] Page 12 of 17 12/8; 2-week follow-up. Wounds are generally smaller in size. These were initially substantial wounds extending into the forefoot all the way into the heel on the bilateral plantar feet. They are now both located on the plantar heel distal aspect both of these have a lot of callus around the wounds I used a #5 curette to remove this on the right and the left also some subcutaneous debris to try and get the wound edges were using Iodoflex. He has heel offloading shoe 12/22; 2-week follow-up. Not really much improvement. He has thick callus around the outer edges of both wounds. I remove this there is some nonviable subcutaneous tissue as well. We have been using Iodoflex. Her intake nurse and myself spontaneously thought of a total contact cast I went back in May. At that time we really were not seeing much of an improvement with a cast although the wound was in a much different situation I would like to retry this in 2 weeks and I discussed this with the patient 08/12/2021; the patient has had some improvement with the Iodoflex. The the area on the left heel plantar more improved than the right. I had to put him in a total contact cast  on the left although I decided to put that off for 2 weeks. I am going to change his primary dressing to silver collagen. I think in both areas he has had some improvement most of the healing seems to be more proximal  in the heel. The wounds are in the mid aspect. A lot of thick callus on the right heel however. 1/19; we are using silver collagen on both plantar heel areas. He has had some improvement today. The left did not require any debridement. He still had some eschar on the right that was debrided but both seem to have contracted. I did not put it total contact cast on him today 2/2 we have been using silver collagen. The area on the right plantar heel has areas that appear to be epithelialized interspersed with dry flaking callus and dry skin. I removed this. This really looks better than on the other side. On the left still a large area with raised edges and debris on the surface. 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 He has no issues or complaints today. 2/9; not much change in the left foot wound with 1 week of a cast we are using silver collagen. Silver collagen on the right side. The right side has been the better wound surface. We will reapply the total contact cast on the left 2/16; not much improvement on either side I been using silver collagen with a total contact cast on the left. I'm changing the Hydrofera Blue still with a total contact cast on the left 2/23; some improvement on both sides. Disappointing that he has thick callus around the area that we are putting in a total contact cast on the left. We've been using Hydrofera Blue on both wound areas. This is a man who at essentially pressure ulcers in addition to ischemia caused by medications to support his blood pressure (pressors) in the ICU. He was being ventilated in the standing position for severe Covid. A Shiley the wounds extended across his entire foot but are now localized to his plantar heels bilaterally. We have made progress however neither areas healed. I continue to think the total contact  cast is helped albeit painstakingly slowly. He has never wanted a plastic surgery consult although I don't know that they would be interested in grafting in area in this location. 10/07/2021: Continued improvement bilaterally. There is still some callus around the left wound, despite the total contact cast. He has some increased pain in his right midfoot around 1 particular area. This has been painful in the past but seems to be a little bit worse. When his cast was removed today, there was an area on the heel of the left foot that looks a bit macerated. He is also complaining of pain in his left thigh and hip which he thinks is secondary to the limb length discrepancy caused by the cast. 10/14/2021: He continues to improve. A little bit less callus around the left wound. He continues to endorse pain in his right midfoot, but this is not as significant as it was last week. The maceration on his left heel is improved. 10/21/2021: Continued improvement to both wounds. The maceration on his left heel is no longer evident. Less callus bilaterally. Epithelialization progressing. 10/28/2021: Significant improvement this week. The right sided wound is nearly closed with just a small open area  at the middle. No maceration seen on the left heel. Continued epithelialization on both sides. No concern for infection. 11/04/2021: T oday, the wounds were measured a little bit differently and come out as larger, but I actually think they are about the same to potentially even smaller, particularly on the left. He continues to accumulate some callus on the right. 11/11/2021: T oday, the patient is expressing some concern that the left wound, despite being in the total contact cast, is not progressing at the same rate as the 1 on the right. He is interested in trying a week without the cast to see how the wound does. The wounds are roughly the same size as last week, with the right perhaps being a little bit smaller. He  continues to build up callus on both sites. 11/18/2021: Last week, I permitted the patient to go without his total contact cast, just to see if the cast was really making any difference. Today, both wounds have deteriorated to some extent, suggesting that the cast is providing benefit, at least on the left. Both are larger and have accumulated callus, slough, and other debris. 11/26/2021: I debrided both wounds quite aggressively last week in an effort to stimulate the healing cascade. This appears to have been effective as the left sided wound is a full centimeter shorter in length. Although the right was measured slightly larger, on inspection, it looks as though an area of epithelialized tissue was included in the measurements. We have been using PolyMem Ag on the wound surfaces with a total contact cast to the left. 12/02/2021: It appears that the intake personnel are including epithelialized tissue in his wound measurements; the right wound is almost completely epithelialized; there is just a crater at the proximal midfoot with some open areas. On the left, he has built up some callus, but the overall wound surface looks good. There is some senescent skin around the wound margin. He has been in PolyMem Ag bilaterally with a total contact cast on the left. 12/09/2021: The right wound is nearly closed; there is just a small open area at the mid calcaneus. On the left, the wound is smaller with minimal callus buildup. No significant drainage. 12/16/2021: The right calcaneal wound remains minimally open at the mid calcaneus; the rest has epithelialized. On the left, the wound is also a little bit smaller. There is some senescent tissue on the periphery. He is getting his first application of a trial skin substitute called Vendaje today. 12/23/2021: The wound on his right calcaneus is nearly closed; there is just a small area at the most distal aspect of the calcaneus that is open. On the left, the area  where we applied to the skin substitute has a healthier-looking bed of granulation tissue. The wound dimensions are not significantly different on this side but the wound surface is improved. 12/30/2021: The wound on the right calcaneus has not changed significantly aside from some accumulation of callus. On the left, the open area is smaller and continues to have an improved surface. He continues to accumulate callus around the wound. He is here for his third application of Vendaje. 01/06/2022: The right calcaneal wound is down to just a couple of millimeters. He continues to accumulate periwound callus. He unfortunately got his cast wet earlier in the week and his left foot is macerated, resulting in some superficial skin loss just distal to the open wound. The open wound itself, however, is much smaller and has a healthier appearing surface.  He is here for his fourth application of Vendaje. 01/13/2022: The right calcaneal wound is about the same. Unfortunately, once again, his cast got wet and his foot is again macerated. This is caused the left calcaneal wound to enlarge. He is here for his fifth application of Vendaje. 01/20/2022: The right calcaneal wound is very small. There is some periwound callus accumulation. He purchased a new cast protector last week and this has been effective in avoiding the maceration that has been occurring on the left. The left calcaneal wound is narrower and has a healthy and viable-appearing surface. He is here for his 6 application of Vendaje. 01/27/2022: The right calcaneal wound is down to just a pinhole. There is some periwound slough and callus. On the left, the wound is narrower and shorter by about a centimeter. The surface is robust and viable-appearing. Unfortunately, the rep for the trial skin substitute product did not provide any for [place] to use MALIKI, HANDFIELD ([id number]) [id number] Page 13 of 17 today. 02/04/2022: The right  calcaneal wound remains unchanged. There is more accumulated callus. On the left, although the intake nurse measured it a little bit longer, it looks about the same to me. The surface has a layer of slough, but underneath this, there is good granulation tissue. 02/10/2022: The right calcaneus wound is nearly closed. There is still some callus that builds up around the site. The left side looks about the same in terms of dimensions, but the surface is more robust and vital-appearing. 02/16/2022: The area of the right calcaneus that was nearly closed last week has closed, but there is a small opening at the mid foot where it looks like some moisture got retained and caused some reopening. The left foot wound is narrower and shallower. Both sites have a fair amount of periwound callus and eschar. 02/24/2022: The small midfoot opening on the right calcaneus is a little bit smaller today. The left foot wound is narrower and shallower. He continues to accumulate periwound callus. No concern for infection. 03/01/2022: The patient came to clinic early because he showered and got his cast wet. Fortunately, there is no significant maceration to his foot but the callus softened and it looks like the wound on his left calcaneus may be a little bit wider. The wound on his right calcaneus is just a narrow slit. Continued accumulation of periwound callus bilaterally. 03/08/2022: The wound on his right calcaneus is very nearly closed, just a small pinpoint opening under a bit of eschar; the left wound has come in quite a bit, as well. It is narrower and shorter than at our last visit. Still with accumulated callus and eschar bilaterally. 03/17/2022: The right calcaneal wound is healed. The left wound is smaller and the surface itself is very clean, but there is some blue-green staining on the periwound callus, concerning for Pseudomonas aeruginosa. 03/23/2022: The right calcaneal wound remains closed. The left wound continues  to contract. No further blue-green staining. Small amount of callus and slough accumulation. 03/28/2022: He came in early today because he had gotten his cast wet. On inspection, the wound itself did not get wet or macerated, just a little bit of the forefoot. The wound itself is basically unchanged. 04/07/2022: The right foot wound remains closed. The left wound is the smallest that I have seen it to date. It is narrower and shorter. It still continues to accumulate slough on the surface. 04/15/2022: There is a band of epithelium now dividing the small left plantar  foot wound in 2. There is still some slough on the surface. 04/21/2022: The wound continues to narrow. Just a little bit of slough on the surface. He seems to be responding well to endoform. 04/28/2022: Continued slow contraction of the wound. There is a little slough on the surface and some periwound callus. We have been using endoform and total contact cast. 05/05/2022: The wound appears to have stalled. There is slough and some periwound eschar/callus. No concern for infection, however. 05/12/2022: Unfortunately, his right foot has reopened. It is located at the most posterior aspect of his surgical incision. The area was noted to have drainage coming from it when his padding was removed today. Underneath some callus and senescent skin, there is an opening. No purulent drainage or malodor. On the left foot, the wound is again unchanged. There is some light blue staining on the callus, but no malodor or purulent drainage. 10/13; right and left heel remanence of extensive plantar foot wounds. These are better than I remember by quite a big margin however he is still left with wounds on the left plantar heel and the right plantar heel. Been using endoform bilaterally. A culture was done that showed apparently Pseudomonas but we are still waiting for the [facility] antibiotic to use gentamicin today. He is still very active by description I am not  sure about the offloading of his noncasted right foot 10/20; both wounds right and left heel debrided not much change from last week. [name] Echevaria has arrived which is linezolid, gentamicin and ciprofloxacin we will use this with endoform. T contact cast on the left otal 06/02/2022: Both wounds are smaller today. There is still a fair amount of callus buildup around the right foot ulcer. The left is more superficial and nearly flush with the surrounding tissues. Also with slough and eschar buildup. 06/10/2022: The right sided wound appears to be nearly closed, if not completely so, although it is somewhat difficult to tell given the abnormal tissue and scarring in his foot. There is a fair amount of callus and crust accumulation. On the left, the wound looks about the same, again with callus and slough. He has an appointment next Thursday with Dr. Annamary Rummage in podiatry; I am hopeful that there may be some reconstructive options available for Mr. Gannett. 06/16/2022: Both wounds have some eschar and callus accumulation. The right sided wound is extremely narrow and barely open; the left is narrower than last week. There is a little bit of slough. He has his appointment in podiatry later today. 06/23/2022: The patient met with Dr. Annamary Rummage last week and unfortunately, there are no reconstructive options that he believes would be helpful. He did order an MRI to evaluate for osteomyelitis and fortunately, none was seen. The left sided wound is a little bit shorter and narrower today. The right sided wound is about the same. There is callus and eschar accumulation bilaterally. 06/29/2022: Both feet have improved from last week. There is epithelium making a valiant effort to creep across the surface on the left. The right side looks like it got a little dry and the deep crevasse in his midfoot has cracked. Both have eschar and there is some slough on the left. 07/07/2022: Both feet have improved. There is  epithelium completely covering the calcaneus on the right with just a small opening in the crevasse in his midfoot. On the left, the open area of tissue is smaller but he continues to build up callus/eschar and slough. 07/15/2022: The opening in  the midfoot on the right is about the same size, covered with eschar and a little bit of slough. The open portion of the left wound is narrower and shorter with a bit of slough buildup. He admits to being on his feet more than recommended. 12/14; as far as I can tell everything on the right foot is closed. There is some eschar I removed some of this I cannot identify any open wound here. As usual this will be a very vulnerable area going forward. On the left this looks really quite healthy. I was pleasantly surprised to see how good this looked. Wound is certainly smaller and there appears to be healthy epithelialization. He has been using Promogran on the right and endoform on the left. He has been offloading the right foot with a heel offloading boot and he has a running shoe on the right foot 08/04/2022: The right foot remains closed. He has a thick cushioned insole in his sneaker. The left sided wound is smaller with just some slough and eschar accumulation. He is wearing the heel off loader on this foot. 08/15/2022: The right foot remains closed. The left wound has narrowed further. There is some slough and eschar accumulation. 08/25/2022: We put him in a peg assist shoe insert and as a result, he has more epithelialization of the ulcer with minimal slough and eschar accumulation. 09/01/2022: The wound is smaller by about half this week. Still with some slough on the surface. The peg assist shoe seems to be doing a remarkable job of adequately offloading the site. KENNIEL, OSTWALD ([id number]) [phone].pdf Page 14 of 17 09/08/2022: There is a little bit more epithelium coming in. There is some slough and callus buildup. 09/16/2022: The  wound measures about the same size, but the epithelium that has grown in looks more robust and stronger. There is some slough and callus buildup. 09/23/2022: The wound remains about the same size. The skin edges are looking rather senescent. 09/29/2022: The aggressive debridement I performed last week seems to have been effective. The wound is smaller and has significantly less slough accumulation. 10/06/2022: There has been quite a bit of epithelialization since last week. There are still some open areas with slough accumulation. There is some callus buildup around the perimeter. 10/13/2022: Continued improvement. Minimal callus accumulation. 3/14; patient presents for follow-up. He has been using endoform to the wound bed without issues. He is using a surgical shoe with peg assist for offloading. 10/27/2022: The wound dimensions are stable. There is some senescent skin accumulation around the perimeter. 11/04/2022: Last week I performed a very aggressive debridement in an effort to stimulate the healing cascade. As has been the case when I have done this before, the wound has responded well. There has been epithelialization and contraction of the wound. There are just a couple of small open areas. 11/10/2022: Unfortunately, his foot got wet secondary to sweating and there has been some breakdown of the tissue, particularly the skin just adjacent to the main ulcer. 11/18/2022: We continue to struggle with moisture-related tissue breakdown around the perimeter of his wound. This has caused the thin epithelium that had formed on the surface to disintegrate. The underlying surface of the wound has good granulation tissue. 11/25/2022: The edges of the wound are much less macerated, but the surface is actually looking a little bit dry. There is slough accumulation. No obvious signs of infection. 12/01/2022: The wound edges are more macerated and broken down, making the wound larger. The surface has  some slough on  it. 12/08/2020: The wound looks better this week. There is significantly less maceration and moisture-related tissue breakdown. There is some slough on the wound surface. 12/15/2022: The wound continues to improve. There is almost no moisture-related tissue breakdown. There is epithelium beginning to fill in again from the edges. Light slough on the wound surface. 12/22/2022: More epithelium has filled him from around the edges. There is some slough on the surface. No evidence of moisture-related tissue breakdown. 01/05/2023: There has been more epithelialization, particularly at the posterior calcaneal aspect of the wound. There has been a little bit of moisture accumulation with minimal maceration. 01/12/2023: More epithelium has filled in. There is very minimal accumulation of slough. 01/20/2023: The wound is measuring a little bit smaller today. It did measure deeper, but this appears to be secondary to some heaped up senescent skin around the edges. The epithelium that has been present at the posterior aspect of the wound seems to be a bit more robust with greater integrity. 01/26/2023: He reports intense itching to the skin around the wound and there has been some breakdown here. It looks fungal in nature. The wound itself looks pretty good with improved epithelialization with just some slough buildup. He has a little callus along the wound edges. 02/02/2023: He has been treating the periwound skin with an over-the-counter athlete's foot medication and notes significant improvement in his pruritus. The skin looks better here, as well. The wound continues to epithelialize. There is light slough accumulation. 02/10/2023: The periwound skin still looks fairly angry. He reports multiple small blisters that have opened up. He is not having the intense itching that he had prior, however. There has been more epithelialization at the calcaneus and there is minimal slough accumulation. 02/16/2023: The periwound skin  is improving. An online review of dermatology rash images suggest that this may be hand-foot-and-mouth disease, isolated to the feet; the patient says he actually had this as a child. The calcaneus continues to epithelialize and the tissue was quite robust here. Very minimal slough accumulation on the open portion of the wound with a little bit of periwound callus buildup. 03/03/2023: The wound measurements are about the same, but there is more epithelium making its way to the surface. There is a little bit of crust and eschar around the edges and minimal slough on the surface. 03/09/2023: His dressing was applied rather haphazardly and the drawtex was not applied. The Optifoam was also reversed such that the orange side was in contact with his bare skin. He has some maceration around the wound edges, but this did not appear to result in any tissue breakdown. There is a little bit of slough on the surface and minimal eschar around the edges. 03/15/2023: The wound measured narrower today. There is some slough accumulation on the surface and some eschar around the edges. 03/23/2023: The length of the wound remains stable, but it continues to narrow. The skin that has covered the posterior aspect of the calcaneus is more robust and has better tensile integrity. Minimal slough and eschar accumulation. 03/30/2023: The wound length has decreased. This seems to be primarily due to more epithelium covering the posterior aspect of the site. Minimal slough and eschar buildup. 04/07/2023: The wound measured a little bit smaller again today. There is minimal slough accumulation with just a little bit of eschar and callus around the edges. 04/13/2023: The lateral aspect of his wound is starting to epithelialize. There is very minimal slough on the surface. Moisture  control is good. 04/20/2023: Continued, albeit extremely slow, encroachment of epithelium as of the wound surface. Minimal slough. Moisture balance is  good. 04/27/2023: More epithelium is filling in from the heel area. There is a little bit of slough on the surface with some periwound callus and eschar. He did note a little bit of drainage coming from his right foot and there is a small crack in the skin extending medially from the main site of his prior ulceration. 05/04/2023: The wounds are stable this week. No significant change. There is a little bit of callus buildup around the edges of each site and some thin slough on the surface on the left foot. 05/11/2023: The skin on the right heel is filling in further. There is a little bit of slough accumulation on both surfaces along with periwound callus accumulation. EMIRHAN, SAYSON ([id number]) [phone].pdf Page 15 of 17 05/18/2023: Both wounds are smaller today. The right foot is down to just a sliver of opening. 05/25/2023: Epithelium continues to advance from the calcaneal area towards the midfoot on the left. On the right, there is tiniest crack of an opening that remains. 06/01/2023: No significant change bilaterally. He has built up quite a bit of callus on the calcaneal surface of the right foot. 06/15/2023: He missed his appointment last week. The wounds measured larger today. There is some slough and eschar accumulation on both wounds. He was discussed in the [facility] wound camp last week. The consensus was in favor of proceeding with plastic surgery intervention but they also suggested repeating the MRI imaging of his heels to ensure no osteomyelitis present. 06/22/2023: The wounds are basically stable. His MRI is scheduled for tomorrow and his surgery for a week from tomorrow, 22 November. 12/9; patient I know from some months ago. 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 He saw Dr. [name] aylor from plastic surgery who is planning surgery. He is received his okay from pulmonology and he sees cardiology on Wednesday. He has been using  silver alginate to both wound areas. The area on the left heel actually looks fairly good healthy looking granulation some epithelialization towards the tip of the heel. Right heel is almost completely eschared. 07/28/2023: No significant change to the wounds. The MRIs have finally been read and there is no osteomyelitis seen in either heel. His surgery had to be rescheduled due to concerns from anesthesiology about his post-COVID cardiopulmonary status. He has been cleared by pulmonology, but cardiology wants to do some additional testing in advance of clearing him for surgery. I understand he is also planning to go ahead with bariatric surgery. Objective Constitutional Slightly hypertensive. no acute distress. Vitals Time Taken: 7:33 AM, Height: 69 in, Weight: 280 lbs, BMI: 41.3, Temperature: 98 F, Pulse: 78 bpm, Respiratory Rate: 16 breaths/min, Blood Pressure: 143/100 mmHg. General Notes: 07/28/2023: No significant change to either of the wounds. There is heavy eschar accumulation on the right, in particular. Integumentary (Hair, Skin) Wound #2 status is Open. Original cause of wound was Pressure Injury. The date acquired was: 10/07/2019. The wound has been in treatment 158 weeks. The wound is located on the Left Calcaneus. The wound measures 3.4cm length x 1.4cm width x 0.1cm depth; 3.738cm^2 area and 0.374cm^3 volume. There is Fat Layer (Subcutaneous Tissue) exposed. There is no tunneling or undermining noted. There is a medium amount of serosanguineous drainage noted. The wound margin is distinct with the outline attached to the wound base. There is large (67-100%)  pink granulation within the wound bed. There is a small (1-33%) amount of necrotic tissue within the wound bed including Eschar and Adherent Slough. The periwound skin appearance had no abnormalities noted for moisture. The periwound skin appearance had no abnormalities noted for color. The periwound skin appearance exhibited:  Callus, Scarring. The periwound skin appearance did not exhibit: Crepitus, Excoriation, Induration, Rash. Periwound temperature was noted as No Abnormality. The periwound has tenderness on palpation. Wound #5 status is Open. Original cause of wound was Gradually Appeared. The date acquired was: 04/27/2023. The wound has been in treatment 13 weeks. The wound is located on the Right,Plantar Foot. The wound measures 3cm length x 0.4cm width x 0.2cm depth; 0.942cm^2 area and 0.188cm^3 volume. There is Fat Layer (Subcutaneous Tissue) exposed. There is no tunneling or undermining noted. There is a medium amount of serosanguineous drainage noted. The wound margin is distinct with the outline attached to the wound base. There is no granulation within the wound bed. There is a small (1-33%) amount of necrotic tissue within the wound bed including Eschar and Adherent Slough. The periwound skin appearance had no abnormalities noted for color. The periwound skin appearance exhibited: Callus, Scarring, Dry/Scaly. The periwound skin appearance did not exhibit: Rash. Periwound temperature was noted as No Abnormality. The periwound has tenderness on palpation. 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 There was a Excisional Skin/Subcutaneous Tissue Debridement with a total area of 4.67 sq cm performed by [name], MD. With the following instrument(s): Curette Material removed includes Eschar, Callus, Subcutaneous Tissue, Slough, and Skin: Epidermis after achieving pain control using Lidocaine 4% T opical Solution. No specimens were taken. A time out was conducted at 07:50, prior to the start of the procedure. A Minimum amount of bleeding was controlled  with Pressure. The procedure was tolerated well with a pain level of 3 throughout and a pain level of 1 following the procedure. Post Debridement Measurements: 3.4cm length x 1.4cm width x 0.1cm depth; 0.374cm^3 volume. Post debridement Stage noted as Category/Stage III. Character of Wound/Ulcer Post Debridement is improved. Post procedure Diagnosis Wound #2: Same as Pre-Procedure ULYSSES, TANSIL ([id number]) [id number] Page 16 of 17 Wound #5 Pre-procedure diagnosis of Wound #5 is a Pressure Ulcer located on the Right,Plantar Foot . There was a Excisional Skin/Subcutaneous Tissue Debridement with a total area of 1.18 sq cm performed by [name], MD. With the following instrument(s): Curette Material removed includes Eschar, Callus, Subcutaneous Tissue, Slough, and Skin: Epidermis after achieving pain control using Lidocaine 4% T opical Solution. No specimens were taken. A time out was conducted at 07:50, prior to the start of the procedure. A Minimum amount of bleeding was controlled with Pressure. The procedure was tolerated well with a pain level of 3 throughout and a pain level of 1 following the procedure. Post Debridement Measurements: 3cm length x 0.4cm width x 0.2cm depth; 0.188cm^3 volume. Post debridement Stage noted as Category/Stage II. Character of Wound/Ulcer Post Debridement is improved. Post procedure Diagnosis Wound #5: Same as Pre-Procedure Plan Follow-up Appointments: Return Appointment in 2 weeks. - or 3 weeks with Dr. Lady Gary Anesthetic: Wound #2 Left Calcaneus: (In clinic) Topical Lidocaine 4% applied to wound bed - In clinic Bathing/ Shower/ Hygiene: May shower and wash wound with  soap and water. Off-Loading: Other: - keep pressure off of the bottom of your feet. Elevate legs throughout the day. Use the Shoe with the PegAssist off-loading insole Additional Orders / Instructions: Follow Nutritious Diet - Try to get 70-100 grams of  Protein a day+ Coupons for Juven sent with patient. Other: - Vitamin C 500 mg per day to start, then increase to 500mg  twice a day. Also consider adding a zinc supplement. Juven Shake 1-2 times daily. WOUND #2: - Calcaneus Wound Laterality: Left Cleanser: Normal Saline (Generic) 1 x Per Day/Other:until healed Discharge Instructions: Cleanse the wound with Normal Saline prior to applying a clean dressing using gauze sponges, not tissue or cotton balls. Cleanser: Wound Cleanser (Generic) 1 x Per Day/Other:until healed Discharge Instructions: Cleanse the wound with wound cleanser prior to applying a clean dressing using gauze sponges, not tissue or cotton balls. Peri-Wound Care: Ketoconazole Cream 2% (Generic) 1 x Per Day/Other:until healed Discharge Instructions: Apply Ketoconazole mixed with zinc to the periwound Peri-Wound Care: Zinc Oxide Ointment 30g tube (Generic) 1 x Per Day/Other:until healed Discharge Instructions: Apply Zinc Oxide to periwound with each dressing change Topical: Gentamicin (Generic) 1 x Per Day/Other:until healed Discharge Instructions: apply to the wound bed Topical: Ketoconazole Cream 2% 1 x Per Day/Other:until healed Discharge Instructions: Apply Ketoconazole to periwound Prim Dressing: Maxorb Extra Ag+ Alginate Dressing, 4x4.75 (in/in) (Generic) 1 x Per Day/Other:until healed ary Discharge Instructions: Apply to wound bed as instructed Secondary Dressing: Drawtex 4x4 in (Generic) 1 x Per Day/Other:until healed Discharge Instructions: Apply over primary dressing as directed. Secondary Dressing: Optifoam Non-Adhesive Dressing, 4x4 in (Generic) 1 x Per Day/Other:until healed Discharge Instructions: As needed- Apply over primary dressing as directed. Secondary Dressing: Woven Gauze Sponge, Non-Sterile 4x4 in (Generic) 1 x Per Day/Other:until healed Discharge Instructions: Apply over primary dressing as directed. Secured With: 66M Medipore H Soft Cloth Surgical T ape, 4  x 10 (in/yd) (Generic) 1 x Per Day/Other:until healed Discharge Instructions: Secure with tape as directed. Com pression Wrap: Kerlix Roll 4.5x3.1 (in/yd) (Generic) 1 x Per Day/Other:until healed Discharge Instructions: Apply Kerlix and Coban compression as directed. WOUND #5: - Foot Wound Laterality: Plantar, Right Peri-Wound Care: Ketoconazole Cream 2% 1 x Per Day/30 Days Discharge Instructions: Apply Ketoconazole to periwound as needed Peri-Wound Care: Zinc Oxide Ointment 30g tube 1 x Per Day/30 Days Discharge Instructions: Apply Zinc Oxide to periwound with each dressing change Topical: Gentamicin 1 x Per Day/30 Days Discharge Instructions: As directed by physician Prim Dressing: Maxorb Extra Ag+ Alginate Dressing, 4x4.75 (in/in) 1 x Per Day/30 Days ary Discharge Instructions: Apply to wound bed as instructed Secondary Dressing: Drawtex 4x4 in 1 x Per Day/30 Days Discharge Instructions: As needed -Apply over primary dressing as directed. Secondary Dressing: Optifoam Non-Adhesive Dressing, 4x4 in 1 x Per Day/30 Days Discharge Instructions: Apply over primary dressing as directed. Secured With: [employer], 4.5x3.1 (in/yd) 1 x Per Day/30 Days Discharge Instructions: Secure with Kerlix as directed. 07/28/2023: No significant change to the wounds. The MRIs have finally been read and there is no osteomyelitis seen in either heel. His surgery had to be rescheduled due to concerns from anesthesiology about his post-COVID cardiopulmonary status. He has been cleared by pulmonology, but cardiology wants to do some additional testing in advance of clearing him for surgery. I understand he is also planning to go ahead with bariatric surgery. I used a curette to debride eschar, slough, skin, and subcutaneous tissue from both of his wounds. We will continue the topical gentamicin  due to his propensity for developing Pseudomonas infections anytime we discontinue it, along with silver alginate,  drawtex as needed to control moisture, with peg assist inserts for offloading. Follow-up in about 2 weeks. Electronic Signature(s) Signed: 07/28/2023 10:26:47 AM By: [name] MD FACS Entered By: [name] on 07/28/2023 05:27:35 [name] ([id number]) [id number] Page 17 of 17 -------------------------------------------------------------------------------- SuperBill Details Patient Name: Date of Service: [name] 07/28/2023 Medical Record Number: [id number] Patient Account Number: [id number] Date of Birth/Sex: Treating RN: [birth year] (47 y.o. M) Primary Care Provider: Dorinda Hill Other Clinician: Referring Provider: Treating Provider/Extender: Jana Hakim in Treatment: 158 Diagnosis Coding ICD-10 Codes Code Description [id number] Non-pressure chronic ulcer of other part of right foot with fat layer exposed L97.522 Non-pressure chronic ulcer of other part of left foot with fat layer exposed Facility Procedures : CPT4 Code: [id number] Description: 11042 - DEB SUBQ TISSUE 20 SQ CM/< ICD-10 Diagnosis Description L97.512 Non-pressure chronic ulcer of other part of right foot with fat layer exposed L97.522 Non-pressure chronic ulcer of other part of left foot with fat layer exposed Modifier: Quantity: 1 Physician Procedures : CPT4 Code Description Modifier [id number] - WC PHYS LEVEL 4 - EST PT ICD-10 Diagnosis Description L97.512 Non-pressure chronic ulcer of other part of right foot with fat layer exposed L97.522 Non-pressure chronic ulcer of other part of left foot  with fat layer exposed Quantity: 1 : [id number] 11042 - WC PHYS SUBQ TISS 20 SQ CM ICD-10 Diagnosis Description L97.512 Non-pressure chronic ulcer of other part of right foot with fat layer exposed L97.522 Non-pressure chronic ulcer of other part of left foot with fat layer exposed Quantity: 1 Electronic Signature(s) Signed: 07/28/2023 10:26:47 AM By:  [name] MD FACS Entered By: [name] on 07/28/2023 05:28:13

## 2023-07-29 NOTE — Progress Notes (Signed)
[name], [name] ([id number]) [id number] Page 1 of 9 Visit Report for 07/28/2023 Arrival Information Details Patient Name: Date of Service: [name], [name] [name] 07/28/2023 7:45 A M Medical Record Number: [id number] Patient Account Number: [id number] Date of Birth/Sex: Treating RN: [birth year] (47 y.o. [name] Primary Care Fitzpatrick Alberico: Dorinda Hill Other Clinician: Referring Dashiel Bergquist: Treating Hildy Nicholl/Extender: Jana Hakim in Treatment: 158 Visit Information History Since Last Visit Added or deleted any medications: No Patient Arrived: Wheel Chair Any new allergies or adverse reactions: No Arrival Time: 07:33 Had a fall or experienced change in No Accompanied By: spouse activities of daily living that may affect Transfer Assistance: Manual risk of falls: Patient Identification Verified: Yes Signs or symptoms of abuse/neglect since last visito No Patient Requires Transmission-Based Precautions: No Hospitalized since last visit: No Patient Has Alerts: No Implantable device outside of the clinic excluding No cellular tissue based products placed in the center since last visit: Has Dressing in Place as Prescribed: Yes Pain Present Now: Yes Electronic Signature(s) Signed: 07/28/2023 10:02:27 AM By: [name] RN Entered By: [name] on 07/28/2023 04:33:33 -------------------------------------------------------------------------------- Encounter Discharge Information Details Patient Name: Date of Service: [name], [name] [name] 07/28/2023 7:45 A M Medical Record Number: [id number] Patient Account Number: [id number] Date of Birth/Sex: Treating RN: 1975-11-22 (47 y.o. [name] Primary Care Aly Seidenberg: Dorinda Hill Other Clinician: Referring Tyria Springer: Treating Manami Tutor/Extender: Jana Hakim in Treatment: 158 Encounter Discharge Information Items Post Procedure Vitals Discharge Condition:  Stable Temperature (F): 98 Ambulatory Status: Ambulatory Pulse (bpm): 78 Discharge Destination: Home Respiratory Rate (breaths/min): 16 Transportation: Private Auto Blood Pressure (mmHg): 143/100 Accompanied By: spouse Schedule Follow-up Appointment: Yes Clinical Summary of Care: Patient Declined Electronic Signature(s) Signed: 07/28/2023 12:14:37 PM By: [name] RN Entered By: [name] on 07/28/2023 08:37:02 [name] ([id number]) [id number] Page 2 of 9 -------------------------------------------------------------------------------- Lower Extremity Assessment Details Patient Name: Date of Service: [name] 07/28/2023 7:45 A M Medical Record Number: [id number] Patient Account Number: [id number] Date of Birth/Sex: Treating RN: May 17, 1976 (46 y.o. [name] Primary Care Millena Callins: Dorinda Hill Other Clinician: Referring Azarian Starace: Treating Markelle Najarian/Extender: Jana Hakim in Treatment: 158 Edema Assessment Assessed: [name]: No] Franne Forts: No] Edema: [Left: No] [Right: No] Calf Left: Right: Point of Measurement: 29 cm From Medial Instep 43 cm 44 cm Ankle Left: Right: Point of Measurement: 9 cm From Medial Instep 26 cm 26.5 cm Vascular Assessment Pulses: Dorsalis Pedis Palpable: [Left:Yes] [Right:Yes] Extremity colors, hair growth, and conditions: Extremity Color: [Left:Normal] Hair Growth on Extremity: [Left:Yes] Temperature of Extremity: [Left:Warm] Capillary Refill: [Left:< 3 seconds] [Right:< 3 seconds] Dependent Rubor: [Left:No No] Electronic Signature(s) Signed: 07/28/2023 10:02:27 AM By: [name] RN Entered By: [name] on 07/28/2023 04:44:14 -------------------------------------------------------------------------------- Multi Wound Chart Details Patient Name: Date of Service: [name], [name] [place] E. 07/28/2023 7:45 A M Medical Record Number: [id number] Patient Account  Number: [id number] Date of Birth/Sex: Treating RN: May 16, 1976 (47 y.o. [name] Primary Care Broughton Eppinger: Dorinda Hill Other Clinician: Referring Kreed Kauffman: Treating Amiyah Shryock/Extender: Jana Hakim in Treatment: 158 Vital Signs Height(in): 69 Pulse(bpm): 78 Weight(lbs): 280 Blood Pressure(mmHg): 143/100 Body Mass Index(BMI): 41.3 Temperature(F): 98 Respiratory Rate(breaths/min): 16 [2:Photos:] [N/A:N/A [id number] Page 3 of 9] Left Calcaneus Right, Plantar Foot N/A Wound Location: Pressure Injury Gradually Appeared N/A Wounding Event: Pressure Ulcer Pressure Ulcer N/A Primary Etiology: Asthma, Angina, Hypertension Asthma, Angina, Hypertension N/A Comorbid History: 10/07/2019 04/27/2023 N/A Date Acquired: 158 13 N/A  Weeks of Treatment: Open Open N/A Wound Status: No No N/A Wound Recurrence: 3.4x1.4x0.1 3x0.4x0.2 N/A Measurements L x W x D (cm) 3.738 0.942 N/A A (cm) : rea 0.374 0.188 N/A Volume (cm) : 86.20% -2938.70% N/A % Reduction in A rea: 98.60% -6166.70% N/A % Reduction in Volume: Category/Stage III Category/Stage II N/A Classification: Medium Medium N/A Exudate A mount: Serosanguineous Serosanguineous N/A Exudate Type: red, brown red, brown N/A Exudate Color: Distinct, outline attached Distinct, outline attached N/A Wound Margin: Large (67-100%) None Present (0%) N/A Granulation A mount: Pink N/A N/A Granulation Quality: Small (1-33%) Small (1-33%) N/A Necrotic A mount: Eschar, Adherent Slough Eschar, Adherent Slough N/A Necrotic Tissue: Fat Layer (Subcutaneous Tissue): Yes Fat Layer (Subcutaneous Tissue): Yes N/A Exposed Structures: Fascia: No Tendon: No Muscle: No Joint: No Bone: No Small (1-33%) Medium (34-66%) N/A Epithelialization: Debridement - Excisional Debridement - Excisional N/A Debridement: Pre-procedure Verification/Time Out 07:50 07:50 N/A Taken: Lidocaine 4% Topical Solution  Lidocaine 4% Topical Solution N/A Pain Control: Necrotic/Eschar, Callus, Necrotic/Eschar, Callus, N/A Tissue Debrided: Subcutaneous, Slough Subcutaneous, Slough Skin/Subcutaneous Tissue Skin/Subcutaneous Tissue N/A Level: 4.67 1.18 N/A Debridement A (sq cm): rea Curette Curette N/A Instrument: Minimum Minimum N/A Bleeding: Pressure Pressure N/A Hemostasis Achieved: 3 3 N/A Procedural Pain: 1 1 N/A Post Procedural Pain: Debridement Treatment Response: Procedure was tolerated well Procedure was tolerated well N/A Post Debridement Measurements L x 3.4x1.4x0.1 3x0.4x0.2 N/A W x D (cm) 0.374 0.188 N/A Post Debridement Volume: (cm) Category/Stage III Category/Stage II N/A Post Debridement Stage: Callus: Yes Callus: Yes N/A Periwound Skin Texture: Scarring: Yes Scarring: Yes Excoriation: No Rash: No Induration: No Crepitus: No Rash: No Dry/Scaly: Yes Dry/Scaly: Yes N/A Periwound Skin Moisture: Maceration: No Atrophie Blanche: No Hemosiderin Staining: No N/A Periwound Skin Color: Cyanosis: No Ecchymosis: No Erythema: No Hemosiderin Staining: No Mottled: No Pallor: No Rubor: No No Abnormality No Abnormality N/A Temperature: Yes Yes N/A Tenderness on Palpation: Debridement Debridement N/A Procedures Performed: Treatment Notes Electronic Signature(s) Signed: 07/28/2023 10:26:47 AM By: [name] MD FACS Signed: 07/28/2023 12:52:17 PM By: Zenaida Deed RN, BSN Entered By: [name] on 07/28/2023 05:16:07 [name] ([id number]) [id number] Page 4 of 9 -------------------------------------------------------------------------------- Multi-Disciplinary Care Plan Details Patient Name: Date of Service: [name] 07/28/2023 7:45 A M Medical Record Number: [id number] Patient Account Number: [id number] Date of Birth/Sex: Treating RN: Aug 12, 1975 (47 y.o. [name] Primary Care Vu Liebman: Dorinda Hill Other  Clinician: Referring Theodore Rahrig: Treating Shellie Rogoff/Extender: Jana Hakim in Treatment: 158 Multidisciplinary Care Plan reviewed with physician Active Inactive Wound/Skin Impairment Nursing Diagnoses: Impaired tissue integrity Knowledge deficit related [name] ulceration/compromised skin integrity Goals: Patient/caregiver will verbalize understanding of skin care regimen Date Initiated: 07/15/2020 Target Resolution Date: 10/06/2023 Goal Status: Active Ulcer/skin breakdown will have a volume reduction of 30% by week 4 Date Initiated: 07/15/2020 Date Inactivated: 08/20/2020 Target Resolution Date: 09/03/2020 Goal Status: Unmet Unmet Reason: no major changes. Ulcer/skin breakdown will heal within 14 weeks Date Initiated: 12/04/2020 Date Inactivated: 12/10/2020 Target Resolution Date: 12/10/2020 Unmet Reason: wounds still open at 14 Goal Status: Unmet weeks and today 21 weeks. Interventions: Assess patient/caregiver ability [name] obtain necessary supplies Assess patient/caregiver ability [name] perform ulcer/skin care regimen upon admission and as needed Assess ulceration(s) every visit Provide education on ulcer and skin care Treatment Activities: Skin care regimen initiated : 07/15/2020 Topical wound management initiated : 07/15/2020 Notes: Electronic Signature(s) Signed: 07/28/2023 12:52:17 PM By: Zenaida Deed RN, BSN Entered By: Zenaida Deed on 07/28/2023 04:43:00 -------------------------------------------------------------------------------- Pain Assessment  Details Patient Name: Date of Service: [name] 07/28/2023 7:45 A M Medical Record Number: [id number] Patient Account Number: [id number] Date of Birth/Sex: Treating RN: 11-26-1975 (47 y.o. [name] Primary Care Janard Culp: Dorinda Hill Other Clinician: Referring Wilene Pharo: Treating Jabez Molner/Extender: Jana Hakim in Treatment: 158 Active Problems Location of Pain Severity  and Description of Pain Patient Has EAVEN, [place] ([id number]) [id number] Page 5 of 9 Patient Has Paino Yes Site Locations Pain Location: Generalized Pain With Dressing Change: No Duration of the Pain. Constant / Intermittento Constant Rate the pain. Current Pain Level: 4 Worst Pain Level: 10 Least Pain Level: 3 Pain Management and Medication Current Pain Management: Electronic Signature(s) Signed: 07/28/2023 10:02:27 AM By: [name] RN Entered By: [name] on 07/28/2023 04:43:42 -------------------------------------------------------------------------------- Patient/Caregiver Education Details Patient Name: Date of Service: [name], [name] 12/20/2024andnbsp7:45 A M Medical Record Number: [id number] Patient Account Number: [id number] Date of Birth/Gender: Treating RN: Jan 05, 1976 (47 y.o. [name] Primary Care Physician: Dorinda Hill Other Clinician: Referring Physician: Treating Physician/Extender: Jana Hakim in Treatment: 158 Education Assessment Education Provided [name]: Patient Education Topics Provided Offloading: Methods: Explain/Verbal Responses: Reinforcements needed, State content correctly Wound/Skin Impairment: Methods: Explain/Verbal Responses: Reinforcements needed, State content correctly Electronic Signature(s) Signed: 07/28/2023 12:52:17 PM By: Zenaida Deed RN, BSN Entered By: Zenaida Deed on 07/28/2023 04:43:24 [name] ([id number]) [id number] Page 6 of 9 -------------------------------------------------------------------------------- Wound Assessment Details Patient Name: Date of Service: [name] 07/28/2023 7:45 A M Medical Record Number: [id number] Patient Account Number: [id number] Date of Birth/Sex: Treating RN: 1976-03-17 (47 y.o. [name] Primary Care Coburn Knaus: Dorinda Hill Other Clinician: Referring  Kelten Enochs: Treating Fotios Amos/Extender: Jana Hakim in Treatment: 158 Wound Status Wound Number: 2 Primary Etiology: Pressure Ulcer Wound Location: Left Calcaneus Wound Status: Open Wounding Event: Pressure Injury Comorbid History: Asthma, Angina, Hypertension Date Acquired: 10/07/2019 Weeks Of Treatment: 158 Clustered Wound: No Photos Wound Measurements Length: (cm) 3.4 Width: (cm) 1.4 Depth: (cm) 0.1 Area: (cm) 3.738 Volume: (cm) 0.374 % Reduction in Area: 86.2% % Reduction in Volume: 98.6% Epithelialization: Small (1-33%) Tunneling: No Undermining: No Wound Description Classification: Category/Stage III Wound Margin: Distinct, outline attached Exudate Amount: Medium Exudate Type: Serosanguineous Exudate Color: red, brown Foul Odor After Cleansing: No Slough/Fibrino No Wound Bed Granulation Amount: Large (67-100%) Exposed Structure Granulation Quality: Pink Fascia Exposed: No Necrotic Amount: Small (1-33%) Fat Layer (Subcutaneous Tissue) Exposed: Yes Necrotic Quality: Eschar, Adherent Slough Tendon Exposed: No Muscle Exposed: No Joint Exposed: No Bone Exposed: No Periwound Skin Texture Texture Color No Abnormalities Noted: No No Abnormalities Noted: Yes Callus: Yes Temperature / Pain Crepitus: No Temperature: No Abnormality Excoriation: No Tenderness on Palpation: Yes Induration: No Rash: No Scarring: Yes Moisture No Abnormalities Noted: Yes Treatment Notes Wound #2 (Calcaneus) Wound Laterality: Left Cleanser Normal Saline Discharge Instruction: Cleanse the wound with Normal Saline prior [name] applying a clean dressing using gauze sponges, not tissue or cotton balls. DEANTRE, HAEFS ([id number]) [id number] Page 7 of 9 Wound Cleanser Discharge Instruction: Cleanse the wound with wound cleanser prior [name] applying a clean dressing using gauze sponges, not tissue or cotton balls. Peri-Wound Care Ketoconazole  Cream 2% Discharge Instruction: Apply Ketoconazole mixed with zinc [name] the periwound Zinc Oxide Ointment 30g tube Discharge Instruction: Apply Zinc Oxide [name] periwound with each dressing change Topical Gentamicin Discharge Instruction: apply [name] the wound bed Ketoconazole Cream 2% Discharge Instruction: Apply Ketoconazole [name] periwound Primary  Dressing Maxorb Extra Ag+ Alginate Dressing, 4x4.75 (in/in) Discharge Instruction: Apply [name] wound bed as instructed Secondary Dressing Drawtex 4x4 in Discharge Instruction: Apply over primary dressing as directed. Optifoam Non-Adhesive Dressing, 4x4 in Discharge Instruction: As needed- Apply over primary dressing as directed. Woven Gauze Sponge, Non-Sterile 4x4 in Discharge Instruction: Apply over primary dressing as directed. Secured With 79M Medipore H Soft Cloth Surgical T ape, 4 x 10 (in/yd) Discharge Instruction: Secure with tape as directed. Compression Wrap Kerlix Roll 4.5x3.1 (in/yd) Discharge Instruction: Apply Kerlix and Coban compression as directed. Compression Stockings Add-Ons Electronic Signature(s) Signed: 07/28/2023 10:02:27 AM By: [name] RN Entered By: [name] on 07/28/2023 04:45:33 -------------------------------------------------------------------------------- Wound Assessment Details Patient Name: Date of Service: [name], [name] [name] 07/28/2023 7:45 A M Medical Record Number: [id number] Patient Account Number: [id number] Date of Birth/Sex: Treating RN: Dec 20, 1975 (47 y.o. [name] Primary Care Dalal Livengood: Dorinda Hill Other Clinician: Referring Talya Quain: Treating Madison Albea/Extender: Jana Hakim in Treatment: 158 Wound Status Wound Number: 5 Primary Etiology: Pressure Ulcer Wound Location: Right, Plantar Foot Wound Status: Open Wounding Event: Gradually Appeared Comorbid History: Asthma, Angina, Hypertension Date Acquired: 04/27/2023 Weeks Of Treatment: 13 Clustered  Wound: No Photos MARLA, LOADHOLT ([id number]) [id number] Page 8 of 9 Wound Measurements Length: (cm) 3 Width: (cm) 0.4 Depth: (cm) 0.2 Area: (cm) 0.942 Volume: (cm) 0.188 % Reduction in Area: -2938.7% % Reduction in Volume: -6166.7% Epithelialization: Medium (34-66%) Tunneling: No Undermining: No Wound Description Classification: Category/Stage II Wound Margin: Distinct, outline attached Exudate Amount: Medium Exudate Type: Serosanguineous Exudate Color: red, brown Foul Odor After Cleansing: No Slough/Fibrino Yes Wound Bed Granulation Amount: None Present (0%) Exposed Structure Necrotic Amount: Small (1-33%) Fat Layer (Subcutaneous Tissue) Exposed: Yes Necrotic Quality: Eschar, Adherent Slough Periwound Skin Texture Texture Color No Abnormalities Noted: No No Abnormalities Noted: Yes Callus: Yes Temperature / Pain Rash: No Temperature: No Abnormality Scarring: Yes Tenderness on Palpation: Yes Moisture No Abnormalities Noted: No Dry / Scaly: Yes Treatment Notes Wound #5 (Foot) Wound Laterality: Plantar, Right Cleanser Peri-Wound Care Ketoconazole Cream 2% Discharge Instruction: Apply Ketoconazole [name] periwound as needed Zinc Oxide Ointment 30g tube Discharge Instruction: Apply Zinc Oxide [name] periwound with each dressing change Topical Gentamicin Discharge Instruction: As directed by physician Primary Dressing Maxorb Extra Ag+ Alginate Dressing, 4x4.75 (in/in) Discharge Instruction: Apply [name] wound bed as instructed Secondary Dressing Drawtex 4x4 in Discharge Instruction: As needed -Apply over primary dressing as directed. Optifoam Non-Adhesive Dressing, 4x4 in Discharge Instruction: Apply over primary dressing as directed. Secured With [employer], 4.5x3.1 (in/yd) Discharge Instruction: Secure with Kerlix as directed. Compression TIVIS, DESANCTIS ([id number]) [id number] Page 9 of 9 Compression  Stockings Add-Ons Electronic Signature(s) Signed: 07/28/2023 10:02:27 AM By: [name] RN Entered By: [name] on 07/28/2023 04:44:35 -------------------------------------------------------------------------------- Vitals Details Patient Name: Date of Service: [name], [name] [place] E. 07/28/2023 7:45 A M Medical Record Number: [id number] Patient Account Number: [id number] Date of Birth/Sex: Treating RN: 09/16/1975 (47 y.o. [name] Primary Care Jahmad Petrich: Dorinda Hill Other Clinician: Referring Latina Frank: Treating Liesl Simons/Extender: Jana Hakim in Treatment: 158 Vital Signs Time Taken: 07:33 Temperature (F): 98 Height (in): 69 Pulse (bpm): 78 Weight (lbs): 280 Respiratory Rate (breaths/min): 16 Body Mass Index (BMI): 41.3 Blood Pressure (mmHg): 143/100 Reference Range: 80 - 120 mg / dl Electronic Signature(s) Signed: 07/28/2023 10:02:27 AM By: [name] RN Entered By: [name] on 07/28/2023 04:42:47

## 2023-08-04 ENCOUNTER — Telehealth (HOSPITAL_COMMUNITY): Payer: Self-pay | Admitting: Emergency Medicine

## 2023-08-04 ENCOUNTER — Encounter (HOSPITAL_COMMUNITY): Payer: Self-pay

## 2023-08-04 NOTE — Telephone Encounter (Signed)
Attempted to call patient regarding upcoming cardiac CT appointment. °Left message on voicemail with name and callback number °Dwan Fennel RN Navigator Cardiac Imaging °[facility] °[phone] Office °[phone] Cell ° °

## 2023-08-07 ENCOUNTER — Encounter: Payer: PPO | Attending: Physical Medicine and Rehabilitation | Admitting: Physical Medicine and Rehabilitation

## 2023-08-07 ENCOUNTER — Encounter: Payer: Self-pay | Admitting: Physical Medicine and Rehabilitation

## 2023-08-07 VITALS — BP 119/69 | HR 73 | Ht 68.5 in | Wt 315.5 lb

## 2023-08-07 DIAGNOSIS — Z993 Dependence on wheelchair: Secondary | ICD-10-CM | POA: Diagnosis not present

## 2023-08-07 DIAGNOSIS — U099 Post covid-19 condition, unspecified: Secondary | ICD-10-CM | POA: Diagnosis not present

## 2023-08-07 DIAGNOSIS — S91309S Unspecified open wound, unspecified foot, sequela: Secondary | ICD-10-CM | POA: Insufficient documentation

## 2023-08-07 DIAGNOSIS — M21372 Foot drop, left foot: Secondary | ICD-10-CM | POA: Diagnosis not present

## 2023-08-07 DIAGNOSIS — Z5181 Encounter for therapeutic drug level monitoring: Secondary | ICD-10-CM | POA: Diagnosis not present

## 2023-08-07 DIAGNOSIS — R0609 Other forms of dyspnea: Secondary | ICD-10-CM | POA: Diagnosis not present

## 2023-08-07 DIAGNOSIS — G894 Chronic pain syndrome: Secondary | ICD-10-CM | POA: Diagnosis not present

## 2023-08-07 DIAGNOSIS — Z79899 Other long term (current) drug therapy: Secondary | ICD-10-CM | POA: Insufficient documentation

## 2023-08-07 DIAGNOSIS — G7281 Critical illness myopathy: Secondary | ICD-10-CM | POA: Insufficient documentation

## 2023-08-07 NOTE — Progress Notes (Signed)
Subjective:    Patient ID: [name], male    DOB: Feb 18, 1976, 47 y.o.   MRN: [id number]  HPI  Pt is a 47 yr old male with COVID ICU myopathy, Long COVID,  s/p surgery on feet due to necrosis from long ICU stay/pressors to save life-s/p  skin grafts and R foot osteomyelitis as well. Occurred 12/20.  Also has L foot drop. Still w/c dependent- still cannot put weight on feet per Plastics.  Here in f/u for critical illness polyneuropathy and wounds on feet B/L     Has Cards eval tomorrow- getting stress test- this week and something else- an ECHO? Pulmonary - been cleared by them- but wants additional breathing testing later.   So won't have surgery until cleared.   Has shoe on the R foot-   R foot opened back up a little bit-  Walking on it too much- also wearing regular tennis shoe, not healing shoe.  L foot "has come a long way".   To put a "framework" in L foot- and attaching skin graft to it.   Starting to "lose his mind here a little bit".   Difficulty starting stream for peeing- eventually can pee- can be really full- not as strong-   Quit taking Lamictal- did cold [place] because felt in zombie land- in fog all day. Kind of helped.  ~ 50% better with being off Lamictal.   Wants to eventually come off Duloxetine.    Has plenty of Norco- not taking much- has used 6 pills since November- on days when it's really bad.   Going back to college and get IT degree!  Pain Inventory Average Pain 5 Pain Right Now 5 My pain is sharp and burning  LOCATION OF PAIN  back knee leg ankle toes   BOWEL Number of stools per week: 5+  BLADDER Normal Frequent urination Yes  Difficulty starting stream Yes   Mobility walk with assistance how many minutes can you walk? 30 sec ability to climb steps?  yes do you drive?  yes use a wheelchair  Function disabled: date disabled 2020 I need assistance with the following:  household duties and  shopping  Neuro/Psych weakness numbness tingling trouble walking spasms dizziness depression  Prior Studies Any changes since last visit?  no  Physicians involved in your care Any changes since last visit?  no   Family History  Problem Relation Age of Onset   Migraines Mother    GER disease Mother    Heart disease Father    Hyperlipidemia Father    Diabetes Father    Hypertension Father    Sleep apnea Father    Obesity Father    Asthma Brother    Pancreatic cancer Maternal Grandmother    Heart disease Maternal Grandfather    Diabetes Paternal Grandmother    Hypertension Paternal Grandmother    Social History   Socioeconomic History   Marital status: Married    Spouse name: Not on file   Number of children: 1   Years of education: Not on file   Highest education level: Not on file  Occupational History   Occupation: delivery driver   Occupation: Stay at home spouse  Tobacco Use   Smoking status: Never   Smokeless tobacco: Never  Vaping Use   Vaping status: Never Used  Substance and Sexual Activity   Alcohol use: Yes    Comment: rarely 2-3 times a year   Drug use: No   Sexual activity:  Yes    Partners: Female  Other Topics Concern   Not on file  Social History Narrative   Not on file   Social Drivers of Health   Financial Resource Strain: Not on file  Food Insecurity: Not on file  Transportation Needs: Not on file  Physical Activity: Not on file  Stress: Not on file  Social Connections: Not on file   Past Surgical History:  Procedure Laterality Date   CANNULATION FOR ECMO (EXTRACORPOREAL MEMBRANE OXYGENATION) N/A 08/28/2019   Procedure: CANNULATION FOR VV ECMO (EXTRACORPOREAL MEMBRANE OXYGENATION);  Surgeon: Donata Clay, Theron Arista, MD;  Location: [facility] OR;  Service: Open Heart Surgery;  Laterality: N/A;  CRESCENT CANNULA   CANNULATION FOR ECMO (EXTRACORPOREAL MEMBRANE OXYGENATION) N/A 09/10/2019   Procedure: CANNULATION FOR ECMO (EXTRACORPOREAL MEMBRANE  OXYGENATION) PUTTING IN CRESCENT 32FR CANNULA  AND REMOVING GROING CANNULATION;  Surgeon: Linden Dolin, MD;  Location: [facility] OR;  Service: Open Heart Surgery;  Laterality: N/A;  PUTTING IN CRESCENT/REMOVING GROIN CANNULATION   CYSTOSCOPY/URETEROSCOPY/HOLMIUM LASER/STENT PLACEMENT Left 04/18/2019   Procedure: LEFT URETEROSCOPY/HOLMIUM LASER/STENT PLACEMENT;  Surgeon: Crist Fat, MD;  Location: [facility];  Service: Urology;  Laterality: Left;   CYSTOSCOPY/URETEROSCOPY/HOLMIUM LASER/STENT PLACEMENT Left 05/02/2019   Procedure: CYSTOSCOPY/URETEROSCOPY/HOLMIUM LASER/STENT EXCHANGE;  Surgeon: Crist Fat, MD;  Location: [facility] ORS;  Service: Urology;  Laterality: Left;   ECMO CANNULATION N/A 08/03/2019   Procedure: ECMO CANNULATION;  Surgeon: Linden Dolin, MD;  Location: [facility];  Service: Cardiovascular;  Laterality: N/A;   ESOPHAGOGASTRODUODENOSCOPY N/A 09/11/2019   Procedure: ESOPHAGOGASTRODUODENOSCOPY (EGD);  Surgeon: Linden Dolin, MD;  Location: [facility] OR;  Service: Thoracic;  Laterality: N/A;   GRAFT APPLICATION Bilateral 03/27/2020   Procedure: APPLICATION OF SKIN GRAFT BILATERAL FEET;  Surgeon: Felecia Shelling, DPM;  Location: [facility];  Service: Podiatry;  Laterality: Bilateral;   IR REPLC GASTRO/COLONIC TUBE PERCUT W/FLUORO  10/14/2019   IRRIGATION AND DEBRIDEMENT SHOULDER Right 09/29/2017   Procedure: IRRIGATION AND DEBRIDEMENT SHOULDER;  Surgeon: Tarry Kos, MD;  Location: [facility] OR;  Service: Orthopedics;  Laterality: Right;   LUMBAR DISC SURGERY  2002   NASAL ENDOSCOPY WITH EPISTAXIS CONTROL N/A 08/31/2019   Procedure: NASAL ENDOSCOPY WITH EPISTAXIS CONTROL WITH CAUTERIZATION;  Surgeon: Christia Reading, MD;  Location: [facility] OR;  Service: ENT;  Laterality: N/A;   PORTACATH PLACEMENT N/A 09/11/2019   Procedure: PEG TUBE INSERTION - BEDSIDE;  Surgeon: Linden Dolin, MD;  Location: [facility] OR;  Service: Thoracic;  Laterality: N/A;   TEE WITHOUT  CARDIOVERSION N/A 08/28/2019   Procedure: TRANSESOPHAGEAL ECHOCARDIOGRAM (TEE);  Surgeon: Donata Clay, Theron Arista, MD;  Location: [facility] OR;  Service: Open Heart Surgery;  Laterality: N/A;   TEE WITHOUT CARDIOVERSION N/A 09/10/2019   Procedure: TRANSESOPHAGEAL ECHOCARDIOGRAM (TEE);  Surgeon: Linden Dolin, MD;  Location: [facility] OR;  Service: Open Heart Surgery;  Laterality: N/A;   WOUND DEBRIDEMENT Bilateral 03/27/2020   Procedure: EXCISIONAL DEBRIDEMENT OF ULCERS BILATERAL FEET;  Surgeon: Felecia Shelling, DPM;  Location: [facility] OR[facility];  Service: Podiatry;  Laterality: Bilateral;   Past Medical History:  Diagnosis Date   Abnormal gait 11/12/2009   Qualifier: Diagnosis of   By: Pearletha Forge MD, Shane         Acute osteomyelitis (HCC) 08/31/2020   Advanced care planning/counseling discussion    Advanced directives, counseling/discussion    Allergic rhinoconjunctivitis 07/21/2015   Anginal pain (HCC)    with covid   Anxiety    Asthma    Chest tube  in place    Chronic left-sided low back pain with left-sided sciatica 09/19/2017   COVID-19 long hauler manifesting chronic dyspnea 09/15/2020   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   Critical illness polyneuropathy (HCC) 09/15/2020   Depression    Diffuse alveolar damage (HCC) 03/09/2020   Dyspnea    Encounter for attention to tracheostomy (HCC) 09/15/2020   Encounter for therapeutic drug monitoring 09/29/2020   Epigastric pain    Eschar of foot    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   Fever    GERD (gastroesophageal reflux disease)    Goals of care, counseling/discussion    Gram positive bacterial infection    Headache    History of COVID-19    History of kidney stones    LEFT URETERAL STONE   HTN (hypertension)    Hyperlipidemia 07/18/2023   Impaired sensation to light touch 04/15/2020   Insomnia due to other mental disorder 12/06/2021   Left foot drop 09/06/2021   Leukopenia    Long COVID 09/09/2020   Moderate  persistent asthma 07/21/2015   Moderate protein-calorie malnutrition (HCC) 09/15/2020   Mood disorder (HCC) 09/07/2022   Morbid obesity (HCC) with starting BMI 46 09/15/2020   MSSA (methicillin susceptible Staphylococcus aureus) infection 08/31/2020   Need for emotional support    Nocturnal hypoxemia 09/07/2022   Obstructive sleep apnea 01/11/2022   Other fatigue 09/07/2022   Palliative care by specialist    Personal history of ECMO    PES PLANUS 10/08/2009   Qualifier: Diagnosis of   By: Pearletha Forge MD, Vincenza Hews      Replacing diagnoses that were inactivated after the 11/07/22 regulatory import     Pneumonia 07/2019   covid   Prediabetes    Preop cardiovascular exam    Pressure injury of skin 08/22/2019   Primary hypertension 04/17/2017   Pyogenic inflammation of bone (HCC) 09/09/2020   Reactive depression 07/01/2020   Septic arthritis of right acromioclavicular joint (HCC) 09/29/2017   SOBOE (shortness of breath on exertion)    Subcutaneous crepitus    TARSAL TUNNEL SYNDROME, LEFT 10/08/2009   Qualifier: Diagnosis of   By: Pearletha Forge MD, Shane         Thrombocytopenia (HCC)    Type 2 diabetes mellitus without complication, without long-term current use of insulin (HCC) 10/18/2022   Ulcer of left foot (HCC) 09/07/2022   Vitamin D deficiency 10/18/2022   Wheelchair dependence 06/04/2021   There were no vitals taken for this visit.  Opioid Risk Score:   Fall Risk Score:  `1  Depression screen [facility] 2/9     08/07/2023   10:46 AM 06/27/2023    2:03 PM 05/08/2023    9:43 AM 04/14/2023   11:23 AM 01/27/2023   10:41 AM 09/07/2022   10:07 AM 07/25/2022    9:46 AM  Depression screen PHQ 2/9  Decreased Interest 3 0 3 0 0 3 1  Down, Depressed, Hopeless 3 0 3 0 0 3 1  PHQ - 2 Score 6 0 6 0 0 6 2  Altered sleeping      3   Tired, decreased energy      3   Change in appetite      2   Feeling bad or failure about yourself       1   Trouble concentrating      2   Moving slowly or  fidgety/restless      1   Suicidal thoughts  0   PHQ-9 Score      18   Difficult doing work/chores      Very difficult       Review of Systems  All other systems reviewed and are negative.      Objective:   Physical Exam  Awake, alert, appropriate, appears a little anxious/depressed more than normal; accompanied by son and wife, NAD In transport w/c Saw pics of B/L feet- R foot- almost completely healed, but opened up a little near heel L foot- still open with thick slough seen in 2 small parts  MSK R DF-  4-/5 and PF 4/5 LLE- DF 0/5; and PF 2+/5      Assessment & Plan:   Pt is a 47 yr old male with COVID ICU myopathy, Long COVID,  s/p surgery on feet due to necrosis from long ICU stay/pressors to save life-s/p  skin grafts and R foot osteomyelitis as well. Occurred 12/20.  Also has L foot drop. Still w/c dependent- still cannot put weight on feet per Plastics.  Here in f/u for critical illness polyneuropathy    Suggest asking PCP or we can send a referral to Urology-  about difficulty starting stream- so suggest Flomax or another medicine to help him empty better.    2. Explained Duloxetine  helps nerve pain and mood. Con't- don't stop it!   3. I think the Brain fog is likely due to long COVID-  Not any of his meds.    4. Supposed to bring pain meds when comes to appointments-   Because you don't take it often won't make come in q2 months, but will need to see every 3 months.    5. I agree with having skin grft for L foot- I agree the chance is there it might not work, but nothing is 100%- and has to stay off L foot completely for 6-8 weeks.    6. Due for oral drug screen- due today per clinic policy. Con't Norco used rarely.    7.  No cool brace for AFO- could use traditional carbon fiber AFO OR could have double upright AFO which goes into his shoe- can pick out shoes.  Foot up brace- will NOT work.  Spent a large period of time discussing- pt doesn't want-  explained we literally have no choice to help him walk.     8. F/U in 3 months- double appt- long COVID/L foot drop   9. Doesn't need refills on any meds today- con't Duloxetine

## 2023-08-07 NOTE — Patient Instructions (Addendum)
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 Occurred 12/20.  Also has L foot drop. Still w/c dependent- still cannot put weight on feet per Plastics.  Here in f/u for critical illness polyneuropathy    Suggest asking PCP or we can send a referral to Urology-  about difficulty starting stream- so suggest Flomax or another medicine to help him empty better.    2. Explained Duloxetine  helps nerve pain and mood. Con't    3. I think the Brain fog is likely due to long COVID-  Not any of his meds.    4. Supposed to bring pain meds when comes to appointments-   Because you don't take it often won't make come in q2 months, but will need to see every 3 months.    5. I agree with having skin grft for L foot- I agree the chance is there it might not work, but nothing is 100%- and has to stay off L foot completely for 6-8 weeks.    6. Due for oral drug screen- due today per clinic policy. Con't Norco- uses rarely   7.  No cool brace for AFO- could use traditional carbon fiber AFO OR could have double upright AFO which goes into his shoe- can pick out shoes.  Foot up brace- will NOT work.  Spent a large period of time discussing- pt doesn't want- explained we literally have no choice to help him walk.     8. F/U in 3 months- double appt-

## 2023-08-07 NOTE — Addendum Note (Signed)
Addended by: Doreene Eland on: 08/07/2023 11:51 AM   Modules accepted: Orders

## 2023-08-08 ENCOUNTER — Ambulatory Visit (HOSPITAL_BASED_OUTPATIENT_CLINIC_OR_DEPARTMENT_OTHER)
Admission: RE | Admit: 2023-08-08 | Discharge: 2023-08-08 | Disposition: A | Discharge status: Home / Self Care | Payer: PPO | Source: Ambulatory Visit | Attending: Cardiology | Admitting: Cardiology

## 2023-08-08 ENCOUNTER — Encounter (HOSPITAL_BASED_OUTPATIENT_CLINIC_OR_DEPARTMENT_OTHER): Payer: Self-pay

## 2023-08-08 DIAGNOSIS — R0609 Other forms of dyspnea: Secondary | ICD-10-CM | POA: Insufficient documentation

## 2023-08-08 DIAGNOSIS — R0602 Shortness of breath: Secondary | ICD-10-CM | POA: Diagnosis not present

## 2023-08-08 LAB — POCT I-STAT CREATININE: Creatinine, Ser: 1.1 mg/dL (ref 0.61–1.24)

## 2023-08-08 MED ORDER — IOHEXOL 350 MG/ML SOLN
100.0000 mL | Freq: Once | INTRAVENOUS | Status: AC | PRN
  Administered 2023-08-08: 95 mL via INTRAVENOUS

## 2023-08-08 MED ORDER — METOPROLOL TARTRATE 5 MG/5ML IV SOLN: 10.0000 mg | Freq: Once | INTRAVENOUS | Status: DC | PRN

## 2023-08-08 MED ORDER — DILTIAZEM HCL 25 MG/5ML IV SOLN: 10.0000 mg | INTRAVENOUS | Status: DC | PRN

## 2023-08-08 MED ORDER — NITROGLYCERIN 0.4 MG SL SUBL
0.8000 mg | SUBLINGUAL_TABLET | Freq: Once | SUBLINGUAL | Status: AC
  Administered 2023-08-08: 0.8 mg via SUBLINGUAL

## 2023-08-10 ENCOUNTER — Ambulatory Visit (HOSPITAL_BASED_OUTPATIENT_CLINIC_OR_DEPARTMENT_OTHER)
Admission: RE | Admit: 2023-08-10 | Discharge: 2023-08-10 | Disposition: A | Discharge status: Home / Self Care | Payer: PPO | Source: Ambulatory Visit | Attending: Cardiology | Admitting: Cardiology

## 2023-08-10 DIAGNOSIS — R0602 Shortness of breath: Secondary | ICD-10-CM | POA: Diagnosis not present

## 2023-08-10 LAB — ECHOCARDIOGRAM COMPLETE
AR max vel: 2.3 cm2
AV Area VTI: 2.32 cm2
AV Area mean vel: 2.62 cm2
AV Mean grad: 8 mm[Hg]
AV Peak grad: 14.6 mm[Hg]
Ao pk vel: 1.91 m/s
Area-P 1/2: 2.56 cm2
Calc EF: 69 %
S' Lateral: 2.9 cm
Single Plane A2C EF: 67.7 %
Single Plane A4C EF: 68.4 %

## 2023-08-10 MED ORDER — PERFLUTREN LIPID MICROSPHERE
1.0000 mL | INTRAVENOUS | Status: AC | PRN
  Administered 2023-08-10: 2 mL via INTRAVENOUS

## 2023-08-11 ENCOUNTER — Encounter (HOSPITAL_BASED_OUTPATIENT_CLINIC_OR_DEPARTMENT_OTHER): Payer: PPO | Attending: General Surgery | Admitting: General Surgery

## 2023-08-11 DIAGNOSIS — L97522 Non-pressure chronic ulcer of other part of left foot with fat layer exposed: Secondary | ICD-10-CM | POA: Insufficient documentation

## 2023-08-11 DIAGNOSIS — L97512 Non-pressure chronic ulcer of other part of right foot with fat layer exposed: Secondary | ICD-10-CM | POA: Insufficient documentation

## 2023-08-11 DIAGNOSIS — L89623 Pressure ulcer of left heel, stage 3: Secondary | ICD-10-CM | POA: Diagnosis not present

## 2023-08-11 DIAGNOSIS — L89892 Pressure ulcer of other site, stage 2: Secondary | ICD-10-CM | POA: Diagnosis not present

## 2023-08-11 NOTE — Progress Notes (Signed)
 [name], [name] ([id number]) [phone] Page 1 of 17 Visit Report for 08/11/2023 Chief Complaint Document Details Patient Name: Date of Service: [name], [name] [name] 08/11/2023 8:45 A M Medical Record Number: [id number] Patient Account Number: [id number] Date of Birth/Sex: Treating RN: [birth year] (48 y.o. M) Primary Care Provider: Stephane Doffing Other Clinician: Referring Provider: Treating Provider/Extender: Marolyn Delon Stephane Doffing Devra in Treatment: 160 Information Obtained from: Patient Chief Complaint Bilateral Plantar Foot Ulcers Electronic Signature(s) Signed: 08/11/2023 9:26:54 AM By: Marolyn Delon MD FACS Entered By: Marolyn Delon on 08/11/2023 09:26:54 -------------------------------------------------------------------------------- Debridement Details Patient Name: Date of Service: [name], [name] [place] E. 08/11/2023 8:45 A M Medical Record Number: [id number] Patient Account Number: [id number] Date of Birth/Sex: Treating RN: January 23, 1976 (48 y.o. [name] Primary Care Provider: Stephane Doffing Other Clinician: Referring Provider: Treating Provider/Extender: Marolyn Delon Stephane Doffing Devra in Treatment: 160 Debridement Performed for Assessment: Wound #2 Left Calcaneus Performed By: Physician Marolyn Delon, MD The following information was scribed by: Merleen [name] The information was scribed for: Marolyn Delon Debridement Type: Debridement Level of Consciousness (Pre-procedure): Awake and Alert Pre-procedure Verification/Time Out Yes - 09:20 Taken: Start Time: 09:21 Pain Control: Lidocaine  4% T opical Solution Percent of Wound Bed Debrided: 100% T Area Debrided (cm): otal 1.65 Tissue and other material debrided: Viable, Non-Viable, Eschar, Slough, Subcutaneous, Slough Level: Skin/Subcutaneous Tissue Debridement Description: Excisional Instrument: Curette Bleeding: Minimum Hemostasis Achieved: Pressure Procedural Pain: 0 Post  Procedural Pain: 0 Response [name] Treatment: Procedure was tolerated well Level of Consciousness (Post- Awake and Alert procedure): Post Debridement Measurements of Total Wound Length: (cm) 2.1 Stage: Category/Stage III Width: (cm) 1 Depth: (cm) 0.1 Volume: (cm) 0.165 Character of Wound/Ulcer Post Debridement: Improved [name], [name] ([id number]) [id number].pdf Page 2 of 17 Post Procedure Diagnosis Same as Pre-procedure Electronic Signature(s) Signed: 08/11/2023 12:14:34 PM By: Marolyn Delon MD FACS Signed: 08/11/2023 12:47:24 PM By: Merleen Handing RN, BSN Entered By: Merleen [name] on 08/11/2023 90:76:72 -------------------------------------------------------------------------------- Debridement Details Patient Name: Date of Service: [name], [name] [place] E. 08/11/2023 8:45 A M Medical Record Number: [id number] Patient Account Number: [id number] Date of Birth/Sex: Treating RN: Mar 31, 1976 (48 y.o. [name] Primary Care Provider: Stephane Doffing Other Clinician: Referring Provider: Treating Provider/Extender: Marolyn Delon Stephane Doffing Devra in Treatment: 160 Debridement Performed for Assessment: Wound #5 Right,Plantar Foot Performed By: Physician Marolyn Delon, MD The following information was scribed by: Merleen [name] The information was scribed for: Marolyn Delon Debridement Type: Debridement Level of Consciousness (Pre-procedure): Awake and Alert Pre-procedure Verification/Time Out Yes - 09:20 Taken: Start Time: 09:21 Pain Control: Lidocaine  4% T opical Solution Percent of Wound Bed Debrided: 125% T Area Debrided (cm): otal 0.74 Tissue and other material debrided: Viable, Non-Viable, Callus, Eschar, Slough, Subcutaneous, Skin: Epidermis, Slough Level: Skin/Subcutaneous Tissue Debridement Description: Excisional Instrument: Curette Bleeding: Minimum Hemostasis Achieved: Pressure Procedural Pain: 0 Post Procedural Pain: 0 Response [name]  Treatment: Procedure was tolerated well Level of Consciousness (Post- Awake and Alert procedure): Post Debridement Measurements of Total Wound Length: (cm) 1.5 Stage: Category/Stage II Width: (cm) 0.5 Depth: (cm) 0.6 Volume: (cm) 0.353 Character of Wound/Ulcer Post Debridement: Improved Post Procedure Diagnosis Same as Pre-procedure Electronic Signature(s) Signed: 08/11/2023 12:14:34 PM By: Marolyn Delon MD FACS Signed: 08/11/2023 12:47:24 PM By: Merleen Handing RN, BSN Entered By: Merleen [name] on 08/11/2023 09:24:45 HPI Details -------------------------------------------------------------------------------- [name] ([id number]) [id number] Page 3 of 17 Patient Name: Date of Service: [name], [name] [name] 08/11/2023 8:45 A M Medical Record Number: [id number] Patient Account Number:  [id number] Date of Birth/Sex: Treating RN: 09/02/1975 (48 y.o. M) Primary Care Provider: Stephane Doffing Other Clinician: Referring Provider: Treating Provider/Extender: Marolyn Delon Stephane Doffing Devra in Treatment: 160 History of Present Illness HPI Description: Wounds are12/03/2020 upon evaluation today patient presents for initial inspection here in our clinic concerning issues he has been having with the bottoms of his feet bilaterally. He states these actually occurred as wounds when he was hospitalized for 5 months secondary [name] Covid. He was apparently with tilting bed where he was in an upright position quite frequently and apparently this occurred in some way shape or form during that time. Fortunately there is no sign of active infection at this time. No fevers, chills, nausea, vomiting, or diarrhea. With that being said he still has substantial wounds on the plantar aspects of his feet Theragen require quite a bit of work [name] get these [name] heal. He has been using Santyl  currently though that is been problematic both in receiving the medication as well as actually paid  for it as it is become quite expensive. Prior [name] the experience with Covid the patient really did not have any major medical problems other than hypertension he does have some mild generalized weakness following the Covid experience. 07/22/2020 on evaluation today patient appears [name] be doing okay in regard [name] his foot ulcers I feel like the wound beds are showing signs of better improvement that I do believe the Iodoflex is helping in this regard. With that being said he does have a lot of drainage currently and this is somewhat blue/green in nature which is consistent with Pseudomonas. I do think a culture today would be appropriate for us  [name] evaluate and see if that is indeed the case I would likely start him on antibiotic orally as well he is not allergic [name] Cipro  knows of no issues he has had in the past 12/21; patient was admitted [name] the clinic earlier this month with bilateral presumed pressure ulcers on the bottom of his feet apparently related [name] excessive pressure from a tilt table arrangement in the intensive care unit. Patient relates this [name] being on ECMO but I am not really sure that is exactly related [name] that. I must say I have never seen anything like this. He has fairly extensive full-thickness wounds extending from his heel towards his midfoot mostly centered laterally. There is already been some healing distally. He does not appear [name] have an arterial issue. He has been using gentamicin  [name] the wound surfaces with Iodoflex [name] help with ongoing debridement 1/6; this is a patient with pressure ulcers on the bottom of his feet related [name] excessive pressure from a standing position in the intensive care unit. He is complaining of a lot of pain in the right heel. He is not a diabetic. 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 We have been using Iodoflex [name] help prepare the surfaces of both wounds for an advanced treatment product. He is nonambulatory spending most of  his time in a wheelchair I have asked him not [name] propel the wheelchair with his heels 1/13; in general his wounds look better not much surface area change we have been using Iodoflex as of last week. I did an x-ray of the right heel as the patient was complaining of pain. I had some thoughts about a stress fracture perhaps Achilles tendon problems however what it showed was erosive changes along the inferior aspect of the calcaneus he now has a MRI booked  for 1/20. 1/20; in general his wounds continue [name] be better. Some improvement in the large narrow areas proximally in his foot. He is still complaining of pain in the right heel and tenderness in certain areas of this wound. His MRI is tonight. I am not just looking for osteomyelitis that was brought up on the x-ray I am wondering about stress fractures, tendon ruptures etc. He has no such findings on the left. Also noteworthy is that the patient had critical illness neuropathy and some of the discomfort may be actual improvement in nerve function I am just not sure. These wounds were initially in the setting of severe critical illness related [name] COVID-19. He was put in a standing position. He may have also been on pressors at the point contributing [name] tissue ischemia. By his description at some point these wounds were grossly necrotic extending proximally up into the Achilles part of his heel. I do not know that I have ever really seen pictures of them like this although they may exist in epic We have ordered Tri layer Oasis. I am trying [name] stimulate some granulation in these areas. This is of course assuming the MRI is negative for infection 1/27; since the patient was last here he saw Dr. Prentiss of infectious disease. He is planned for vancomycin  and ceftriaxone . Prior operative culture grew MSSA. Also ordered baseline lab work. He also ordered arterial studies although the ABIs in our clinic were normal as well as his clinical exam these were  normal I do not think he needs [name] see vascular surgery. His ABIs at the [facility] were 1.22 in the right triphasic waveforms with a normal TBI of 1.15 on the left ABI of 1.22 with triphasic waveforms and a normal TBI of 1.08. Finally he saw Dr. Janit who will follow him in for 2 months. At this point I do not think he felt that he needed a procedure on the right calcaneal bone. Dr. Prentiss is elected for broad-spectrum antibiotic The patient is still having pain in the right heel. He walks with a walker 2/3; wounds are generally smaller. He is tolerating his IV antibiotics. I believe this is vancomycin  and ceftriaxone . We are still waiting for Oasis burn in terms of his out-of-pocket max which he should be meeting soon given the IV antibiotics, MRIs etc. I have asked him [name] check in on this. We are using silver collagen in the meantime the wounds look better 2/10; tolerating IV vancomycin  and Rocephin . We are waiting [name] apply for Oasis. Although I am not really sure where he is in his out-of-pocket max. 2/17 started the first application of Oasis trilayer. Still on antibiotics. The wounds have generally look better. The area on the left has a little more surface slough requiring debridement 2/24; second application of Oasis trilayer. The wound surface granulation is generally look better. The area on the left with undermining laterally I think is come in a bit. 10/08/2020 upon evaluation today patient is here today for [employer] application #3. Fortunately he seems [name] be doing extremely well with regard [name] this and we are seeing a lot of new epithelial growth which is great news. Fortunately there is no signs of active infection at this time. 10/16/2020 upon evaluation today patient appears [name] be doing well with regard [name] his foot ulcers. Do believe the Oasis has been of benefit for him. I do not see any signs of infection right now which is great news and I think that  he has a lot of new epithelial  growth which is great [name] see as well. The patient is very pleased [name] hear all of this. I do think we can proceed with the Oasis trilayer #4 today. 3/18; not as much improvement in these areas on his heels that I was hoping. I did reapply trilateral Oasis today the tissue looks healthier but not as much fill in as I was hoping. 3/25; better looking today I think this is come in a bit the tissue looks healthier. Triple layer Oasis reapplied #6 4/1; somewhat better looking definitely better looking surface not as much change in surface area as I was hoping. He may be spending more time [place] on days then he needs [name] although he does have heel offloading boots. Triple layer Oasis reapplied #7 4/7; unfortunately apparently [employer] will not approve any further Oasis which is unfortunate since the patient did respond nicely both in terms of the condition of the wound bed as well as surface area. There is still some drainage coming from the wound but not a lot there does not appear [name] be any infection 4/15; we have been using Hydrofera Blue. He continues [name] have nice rims of epithelialization on the right greater than the left. The left the epithelialization is coming from the tip of his heel. There is moderate drainage. In this that concerns me about a total contact cast. There is no evidence of infection 4/29; patient has been using Hydrofera Blue with dressing changes. He has no complaints or issues today. 5/5; using Hydrofera Blue. I actually think that he looks marginally better than the last time I saw this 3 weeks ago. There are rims of epithelialization on the left thumb coming from the medial side on the right. Using Hydrofera Blue 5/12; using Hydrofera Blue. These continue [name] make improvements in surface area. His drainage was not listed as severe I therefore went ahead and put a cast on the left foot. Right foot we will continue [name] dress his previous 5/16; back for first total  contact cast change. He did not tolerate this particularly well cast injury on the anterior tibia among other issues. Difficulty sleeping. [name], [name] ([id number]) [phone].pdf Page 4 of 17 talked him about this in some detail and afterwards is elected [name] continue. I told him I would like [name] have a cast on for 3 weeks [name] see if this is going [name] help at all. I think he agreed 5/19; I think the wound is better. There is no tunneling towards his midfoot. The undermining medially also looks better. He has a rim of new skin distally. I think we are making progress here. The area on the left also continues [name] look somewhat better [name] me using Hydrofera Blue. He has a list of complaints about the cast but none of them seem serious 5/26; patient presents for 1 week follow-up. He has been using a total contact cast and tolerating this well. Hydrofera Blue is the main dressing used. He denies signs of infection. 6/2 Hydrofera Blue total contact cast on the left. These were large ulcers that formed in intensive care unit where the patient was recovering from COVID. May have had something to do with being ventilated in an upright positiono Pressors etc. We have been able [name] get the areas down considerably and a viable surface. There is some epithelialization in both sides. Note made of drainage 6/9; changed [name] Hydrofera Blue last time because of drainage.  He arrives with better looking surfaces and dimensions on the left than the right. Paradoxically the right actually probes more towards his midfoot the left is largely close down but both of these look improved. Using a total contact cast on the left 6/16; complex wounds on his bilateral plantar heels which were initially pressure injury from a stay in the ICU with COVID. We have been using silver alginate most recently. His dimensions of come in quite dramatically however not recently. We have been putting the left foot in a total  contact cast 6/23; complex wounds on the bilateral plantar heels. I been putting the left in the cast paradoxically the area on the right is the one that is going towards closure at a faster rate. Quite a bit of drainage on the left. The patient went [name] see Dr. Janit who said he was going [name] standby for skin grafts. I had actually considered sending him for skin grafts however he would be mandatorily off his feet for a period of weeks [name] months. I am thinking that the area on the right is going [name] close on its own the area on the left has been more stubborn even though we have him in a total contact cast 6/30; took him out of a total contact cast last week is the right heel seem [name] be making better progress than the left where I was placing the cast. We are using silver alginate. Both wounds are smaller right greater than left 7/12; both wounds look as though they are making some progress. We are using silver alginate. Heel offloading boots 7/26; very gradual progress especially on the right. Using silver alginate. He is wearing heel offloading boots 8/18; he continues [name] close these wounds down very gradually. Using silver alginate. The problem polymen being definitive about this is areas of what appears [name] be callus around the margins. This is not a 100% of the area but certainly sizable especially on the right 9/1; bilateral plantar feet wounds secondary [name] prolonged pressure while being ventilated for COVID-19 in an upright position. Essentially pressure ulcers on the bottom of his feet. He is made substantial progress using silver alginate. 9/14; bilateral plantar feet wounds secondary [name] prolonged pressure. Making progress using silver alginate. 9/29 bilateral plantar feet wounds secondary [name] prolonged pressure. I changed him [name] Iodoflex last week. MolecuLight showing reddened blush fluorescence 10/11; patient presents for follow-up. He has no issues or complaints today. He denies signs of  infection. He continues [name] use Iodoflex and antibiotic ointment [name] the wound beds. 10/27; 2-week follow-up. No evidence of infection. He has callus and thick dry skin around the wound margins we have been using Iodoflex and Bactroban  which was in response [name] a moderate left MolecuLight reddish blush fluorescence. 11/10; 2-week follow-up. Wound margins again have thick callus however the measurements of the actual wound sites are a lot smaller. Everything looks reasonably healthy here. We have been using Iodoflex He was approved for prime matrix but I have elected [name] delay this given the improvement in the surface area. Hopefully I will not regret that decision as were getting close [name] the end of the year in terms of insurance payment 12/8; 2-week follow-up. Wounds are generally smaller in size. These were initially substantial wounds extending into the forefoot all the way into the heel on the bilateral plantar feet. They are now both located on the plantar heel distal aspect both of these have a lot of callus around the wounds I  used a #5 curette [name] remove this on the right and the left also some subcutaneous debris [name] try and get the wound edges were using Iodoflex. He has heel offloading shoe 12/22; 2-week follow-up. Not really much improvement. He has thick callus around the outer edges of both wounds. I remove this there is some nonviable subcutaneous tissue as well. We have been using Iodoflex. Her intake nurse and myself spontaneously thought of a total contact cast I went back in May. At that time we really were not seeing much of an improvement with a cast although the wound was in a much different situation I would like [name] retry this in 2 weeks and I discussed this with the patient 08/12/2021; the patient has had some improvement with the Iodoflex. The the area on the left heel plantar more improved than the right. I had [name] put him in a total contact cast on the left although I decided [name] put  that off for 2 weeks. I am going [name] change his primary dressing [name] silver collagen. I think in both areas he has had some improvement most of the healing seems [name] be more proximal in the heel. The wounds are in the mid aspect. A lot of thick callus on the right heel however. 1/19; we are using silver collagen on both plantar heel areas. He has had some improvement today. The left did not require any debridement. He still had some eschar on the right that was debrided but both seem [name] have contracted. I did not put it total contact cast on him today 2/2 we have been using silver collagen. The area on the right plantar heel has areas that appear [name] be epithelialized interspersed with dry flaking callus and dry skin. I removed this. This really looks better than on the other side. On the left still a large area with raised edges and debris on the surface. 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 He has no issues or complaints today. 2/9; not much change in the left foot wound with 1 week of a cast we are using silver collagen. Silver collagen on the right side. The right side has been the better wound surface. We will reapply the total contact cast on the left 2/16; not much improvement on either side I been using silver collagen with a total contact cast on the left. I'm changing the Hydrofera Blue still with a total contact cast on the left 2/23; some improvement on both sides. Disappointing that he has thick callus around the area that we are putting in a total contact cast on the left. We've been using Hydrofera Blue on both wound areas. This is a man who at essentially pressure ulcers in addition [name] ischemia caused by medications [name] support his blood pressure (pressors) in the ICU. He was being ventilated in the standing position for severe Covid. A Shiley the wounds  extended across his entire foot but are now localized [name] his plantar heels bilaterally. We have made progress however neither areas healed. I continue [name] think the total contact cast is helped albeit painstakingly slowly. He has never wanted a plastic surgery consult although I don't know that they would be interested in grafting in area in this location. 10/07/2021: Continued improvement bilaterally. There is still some callus around the left wound, despite the total contact cast. He  has some increased pain in his right midfoot around 1 particular area. This has been painful in the past but seems [name] be a little bit worse. When his cast was removed today, there was an area on the heel of the left foot that looks a bit macerated. He is also complaining of pain in his left thigh and hip which he thinks is secondary [name] the limb length discrepancy caused by the cast. 10/14/2021: He continues [name] improve. A little bit less callus around the left wound. He continues [name] endorse pain in his right midfoot, but this is not as significant as it was last week. The maceration on his left heel is improved. 10/21/2021: Continued improvement [name] both wounds. The maceration on his left heel is no longer evident. Less callus bilaterally. Epithelialization progressing. 10/28/2021: Significant improvement this week. The right sided wound is nearly closed with just a small open area at the middle. No maceration seen on the left heel. Continued epithelialization on both sides. No concern for infection. 11/04/2021: T oday, the wounds were measured a little bit differently and come out as larger, but I actually think they are about the same [name] potentially even smaller, particularly on the left. He continues [name] accumulate some callus on the right. 11/11/2021: T oday, the patient is expressing some concern that the left wound, despite being in the total contact cast, is not progressing at the same rate as the 1 on the right. He is  interested in trying a week without the cast [name] see how the wound does. The wounds are roughly the same size as last week, with the right KAREE, CHRISTOPHERSON ([id number]) [phone].pdf Page 5 of 17 perhaps being a little bit smaller. He continues [name] build up callus on both sites. 11/18/2021: Last week, I permitted the patient [name] go without his total contact cast, just [name] see if the cast was really making any difference. Today, both wounds have deteriorated [name] some extent, suggesting that the cast is providing benefit, at least on the left. Both are larger and have accumulated callus, slough, and other debris. 11/26/2021: I debrided both wounds quite aggressively last week in an effort [name] stimulate the healing cascade. This appears [name] have been effective as the left sided wound is a full centimeter shorter in length. Although the right was measured slightly larger, on inspection, it looks as though an area of epithelialized tissue was included in the measurements. We have been using PolyMem Ag on the wound surfaces with a total contact cast [name] the left. 12/02/2021: It appears that the intake personnel are including epithelialized tissue in his wound measurements; the right wound is almost completely epithelialized; there is just a crater at the proximal midfoot with some open areas. On the left, he has built up some callus, but the overall wound surface looks good. There is some senescent skin around the wound margin. He has been in PolyMem Ag bilaterally with a total contact cast on the left. 12/09/2021: The right wound is nearly closed; there is just a small open area at the mid calcaneus. On the left, the wound is smaller with minimal callus buildup. No significant drainage. 12/16/2021: The right calcaneal wound remains minimally open at the mid calcaneus; the rest has epithelialized. On the left, the wound is also a little bit smaller. There is some senescent tissue on the periphery.  He is getting his first application of a trial skin substitute called Vendaje today. 12/23/2021: The wound on his right calcaneus  is nearly closed; there is just a small area at the most distal aspect of the calcaneus that is open. On the left, the area where we applied [name] the skin substitute has a healthier-looking bed of granulation tissue. The wound dimensions are not significantly different on this side but the wound surface is improved. 12/30/2021: The wound on the right calcaneus has not changed significantly aside from some accumulation of callus. On the left, the open area is smaller and continues [name] have an improved surface. He continues [name] accumulate callus around the wound. He is here for his third application of Vendaje. 01/06/2022: The right calcaneal wound is down [name] just a couple of millimeters. He continues [name] accumulate periwound callus. He unfortunately got his cast wet earlier in the week and his left foot is macerated, resulting in some superficial skin loss just distal [name] the open wound. The open wound itself, however, is much smaller and has a healthier appearing surface. He is here for his fourth application of Vendaje. 01/13/2022: The right calcaneal wound is about the same. Unfortunately, once again, his cast got wet and his foot is again macerated. This is caused the left calcaneal wound [name] enlarge. He is here for his fifth application of Vendaje. 01/20/2022: The right calcaneal wound is very small. There is some periwound callus accumulation. He purchased a new cast protector last week and this has been effective in avoiding the maceration that has been occurring on the left. The left calcaneal wound is narrower and has a healthy and viable-appearing surface. He is here for his 6 application of Vendaje. 01/27/2022: The right calcaneal wound is down [name] just a pinhole. There is some periwound slough and callus. On the left, the wound is narrower and shorter by about a centimeter. The  surface is robust and viable-appearing. Unfortunately, the rep for the trial skin substitute product did not provide any for us  [name] use today. 02/04/2022: The right calcaneal wound remains unchanged. There is more accumulated callus. On the left, although the intake nurse measured it a little bit longer, it looks about the same [name] me. The surface has a layer of slough, but underneath this, there is good granulation tissue. 02/10/2022: The right calcaneus wound is nearly closed. There is still some callus that builds up around the site. The left side looks about the same in terms of dimensions, but the surface is more robust and vital-appearing. 02/16/2022: The area of the right calcaneus that was nearly closed last week has closed, but there is a small opening at the mid foot where it looks like some moisture got retained and caused some reopening. The left foot wound is narrower and shallower. Both sites have a fair amount of periwound callus and eschar. 02/24/2022: The small midfoot opening on the right calcaneus is a little bit smaller today. The left foot wound is narrower and shallower. He continues [name] accumulate periwound callus. No concern for infection. 03/01/2022: The patient came [name] clinic early because he showered and got his cast wet. Fortunately, there is no significant maceration [name] his foot but the callus softened and it looks like the wound on his left calcaneus may be a little bit wider. The wound on his right calcaneus is just a narrow slit. Continued accumulation of periwound callus bilaterally. 03/08/2022: The wound on his right calcaneus is very nearly closed, just a small pinpoint opening under a bit of eschar; the left wound has come in quite a bit, as well. It is narrower  and shorter than at our last visit. Still with accumulated callus and eschar bilaterally. 03/17/2022: The right calcaneal wound is healed. The left wound is smaller and the surface itself is very clean, but there is  some blue-green staining on the periwound callus, concerning for Pseudomonas aeruginosa. 03/23/2022: The right calcaneal wound remains closed. The left wound continues [name] contract. No further blue-green staining. Small amount of callus and slough accumulation. 03/28/2022: He came in early today because he had gotten his cast wet. On inspection, the wound itself did not get wet or macerated, just a little bit of the forefoot. The wound itself is basically unchanged. 04/07/2022: The right foot wound remains closed. The left wound is the smallest that I have seen it [name] date. It is narrower and shorter. It still continues [name] accumulate slough on the surface. 04/15/2022: There is a band of epithelium now dividing the small left plantar foot wound in 2. There is still some slough on the surface. 04/21/2022: The wound continues [name] narrow. Just a little bit of slough on the surface. He seems [name] be responding well [name] endoform. 04/28/2022: Continued slow contraction of the wound. There is a little slough on the surface and some periwound callus. We have been using endoform and total contact cast. 05/05/2022: The wound appears [name] have stalled. There is slough and some periwound eschar/callus. No concern for infection, however. 05/12/2022: Unfortunately, his right foot has reopened. It is located at the most posterior aspect of his surgical incision. The area was noted [name] have drainage coming from it when his padding was removed today. Underneath some callus and senescent skin, there is an opening. No purulent drainage or malodor. On the left foot, the wound is again unchanged. There is some light blue staining on the callus, but no malodor or purulent drainage. 10/13; right and left heel remanence of extensive plantar foot wounds. These are better than I remember by quite a big margin however he is still left with wounds on the left plantar heel and the right plantar heel. Been using endoform bilaterally. A culture was  done that showed apparently Pseudomonas but we are still waiting for the [facility] antibiotic [name] use gentamicin  today. He is still very active by description I am not sure about the offloading of his noncasted right foot 10/20; both wounds right and left heel debrided not much change from last week. Patsy has arrived which is linezolid, gentamicin  and ciprofloxacin  we will use this with endoform. T contact cast on the left KARSTEN, HOWRY ([id number]) [phone].pdf Page 6 of 17 06/02/2022: Both wounds are smaller today. There is still a fair amount of callus buildup around the right foot ulcer. The left is more superficial and nearly flush with the surrounding tissues. Also with slough and eschar buildup. 06/10/2022: The right sided wound appears [name] be nearly closed, if not completely so, although it is somewhat difficult [name] tell given the abnormal tissue and scarring in his foot. There is a fair amount of callus and crust accumulation. On the left, the wound looks about the same, again with callus and slough. He has an appointment next Thursday with Dr. Malvin in podiatry; I am hopeful that there may be some reconstructive options available for Mr. Mak. 06/16/2022: Both wounds have some eschar and callus accumulation. The right sided wound is extremely narrow and barely open; the left is narrower than last week. There is a little bit of slough. He has his appointment in podiatry later today. 06/23/2022:  The patient met with Dr. Malvin last week and unfortunately, there are no reconstructive options that he believes would be helpful. He did order an MRI [name] evaluate for osteomyelitis and fortunately, none was seen. The left sided wound is a little bit shorter and narrower today. The right sided wound is about the same. There is callus and eschar accumulation bilaterally. 06/29/2022: Both feet have improved from last week. There is epithelium making a valiant  effort [name] creep across the surface on the left. The right side looks like it got a little dry and the deep crevasse in his midfoot has cracked. Both have eschar and there is some slough on the left. 07/07/2022: Both feet have improved. There is epithelium completely covering the calcaneus on the right with just a small opening in the crevasse in his midfoot. On the left, the open area of tissue is smaller but he continues [name] build up callus/eschar and slough. 07/15/2022: The opening in the midfoot on the right is about the same size, covered with eschar and a little bit of slough. The open portion of the left wound is narrower and shorter with a bit of slough buildup. He admits [name] being on his feet more than recommended. 12/14; as far as I can tell everything on the right foot is closed. There is some eschar I removed some of this I cannot identify any open wound here. As usual this will be a very vulnerable area going forward. On the left this looks really quite healthy. I was pleasantly surprised [name] see how good this looked. Wound is certainly smaller and there appears [name] be healthy epithelialization. He has been using Promogran on the right and endoform on the left. He has been offloading the right foot with a heel offloading boot and he has a running shoe on the right foot 08/04/2022: The right foot remains closed. He has a thick cushioned insole in his sneaker. The left sided wound is smaller with just some slough and eschar accumulation. He is wearing the heel off loader on this foot. 08/15/2022: The right foot remains closed. The left wound has narrowed further. There is some slough and eschar accumulation. 08/25/2022: We put him in a peg assist shoe insert and as a result, he has more epithelialization of the ulcer with minimal slough and eschar accumulation. 09/01/2022: The wound is smaller by about half this week. Still with some slough on the surface. The peg assist shoe seems [name] be doing a  remarkable job of adequately offloading the site. 09/08/2022: There is a little bit more epithelium coming in. There is some slough and callus buildup. 09/16/2022: The wound measures about the same size, but the epithelium that has grown in looks more robust and stronger. There is some slough and callus buildup. 09/23/2022: The wound remains about the same size. The skin edges are looking rather senescent. 09/29/2022: The aggressive debridement I performed last week seems [name] have been effective. The wound is smaller and has significantly less slough accumulation. 10/06/2022: There has been quite a bit of epithelialization since last week. There are still some open areas with slough accumulation. There is some callus buildup around the perimeter. 10/13/2022: Continued improvement. Minimal callus accumulation. 3/14; patient presents for follow-up. He has been using endoform [name] the wound bed without issues. He is using a surgical shoe with peg assist for offloading. 10/27/2022: The wound dimensions are stable. There is some senescent skin accumulation around the perimeter. 11/04/2022: Last week I performed a very  aggressive debridement in an effort [name] stimulate the healing cascade. As has been the case when I have done this before, the wound has responded well. There has been epithelialization and contraction of the wound. There are just a couple of small open areas. 11/10/2022: Unfortunately, his foot got wet secondary [name] sweating and there has been some breakdown of the tissue, particularly the skin just adjacent [name] the main ulcer. 11/18/2022: We continue [name] struggle with moisture-related tissue breakdown around the perimeter of his wound. This has caused the thin epithelium that had formed on the surface [name] disintegrate. The underlying surface of the wound has good granulation tissue. 11/25/2022: The edges of the wound are much less macerated, but the surface is actually looking a little bit dry. There is slough  accumulation. No obvious signs of infection. 12/01/2022: The wound edges are more macerated and broken down, making the wound larger. The surface has some slough on it. 12/08/2020: The wound looks better this week. There is significantly less maceration and moisture-related tissue breakdown. There is some slough on the wound surface. 12/15/2022: The wound continues [name] improve. There is almost no moisture-related tissue breakdown. There is epithelium beginning [name] fill in again from the edges. Light slough on the wound surface. 12/22/2022: More epithelium has filled him from around the edges. There is some slough on the surface. No evidence of moisture-related tissue breakdown. 01/05/2023: There has been more epithelialization, particularly at the posterior calcaneal aspect of the wound. There has been a little bit of moisture accumulation with minimal maceration. 01/12/2023: More epithelium has filled in. There is very minimal accumulation of slough. 01/20/2023: The wound is measuring a little bit smaller today. It did measure deeper, but this appears [name] be secondary [name] some heaped up senescent skin around the edges. The epithelium that has been present at the posterior aspect of the wound seems [name] be a bit more robust with greater integrity. 01/26/2023: He reports intense itching [name] the skin around the wound and there has been some breakdown here. It looks fungal in nature. The wound itself looks pretty good with improved epithelialization with just some slough buildup. He has a little callus along the wound edges. [name], [name] ([id number]) [phone].pdf Page 7 of 17 02/02/2023: He has been treating the periwound skin with an over-the-counter athlete's foot medication and notes significant improvement in his pruritus. The skin looks better here, as well. The wound continues [name] epithelialize. There is light slough accumulation. 02/10/2023: The periwound skin still looks fairly angry.  He reports multiple small blisters that have opened up. He is not having the intense itching that he had prior, however. There has been more epithelialization at the calcaneus and there is minimal slough accumulation. 02/16/2023: The periwound skin is improving. An online review of dermatology rash images suggest that this may be hand-foot-and-mouth disease, isolated [name] the feet; the patient says he actually had this as a child. The calcaneus continues [name] epithelialize and the tissue was quite robust here. Very minimal slough accumulation on the open portion of the wound with a little bit of periwound callus buildup. 03/03/2023: The wound measurements are about the same, but there is more epithelium making its way [name] the surface. There is a little bit of crust and eschar around the edges and minimal slough on the surface. 03/09/2023: His dressing was applied rather haphazardly and the drawtex was not applied. The Optifoam was also reversed such that the orange side was in contact with his bare skin. He  has some maceration around the wound edges, but this did not appear [name] result in any tissue breakdown. There is a little bit of slough on the surface and minimal eschar around the edges. 03/15/2023: The wound measured narrower today. There is some slough accumulation on the surface and some eschar around the edges. 03/23/2023: The length of the wound remains stable, but it continues [name] narrow. The skin that has covered the posterior aspect of the calcaneus is more robust and has better tensile integrity. Minimal slough and eschar accumulation. 03/30/2023: The wound length has decreased. This seems [name] be primarily due [name] more epithelium covering the posterior aspect of the site. Minimal slough and eschar buildup. 04/07/2023: The wound measured a little bit smaller again today. There is minimal slough accumulation with just a little bit of eschar and callus around the edges. 04/13/2023: The lateral aspect of his  wound is starting [name] epithelialize. There is very minimal slough on the surface. Moisture control is good. 04/20/2023: Continued, albeit extremely slow, encroachment of epithelium as of the wound surface. Minimal slough. Moisture balance is good. 04/27/2023: More epithelium is filling in from the heel area. There is a little bit of slough on the surface with some periwound callus and eschar. He did note a little bit of drainage coming from his right foot and there is a small crack in the skin extending medially from the main site of his prior ulceration. 05/04/2023: The wounds are stable this week. No significant change. There is a little bit of callus buildup around the edges of each site and some thin slough on the surface on the left foot. 05/11/2023: The skin on the right heel is filling in further. There is a little bit of slough accumulation on both surfaces along with periwound callus accumulation. 05/18/2023: Both wounds are smaller today. The right foot is down [name] just a sliver of opening. 05/25/2023: Epithelium continues [name] advance from the calcaneal area towards the midfoot on the left. On the right, there is tiniest crack of an opening that remains. 06/01/2023: No significant change bilaterally. He has built up quite a bit of callus on the calcaneal surface of the right foot. 06/15/2023: He missed his appointment last week. The wounds measured larger today. There is some slough and eschar accumulation on both wounds. He was discussed in the [facility] wound camp last week. The consensus was in favor of proceeding with plastic surgery intervention but they also suggested repeating the MRI imaging of his heels [name] ensure no osteomyelitis present. 06/22/2023: The wounds are basically stable. His MRI is scheduled for tomorrow and his surgery for a week from tomorrow, 22 November. 12/9; patient I know from some months ago. 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 He  saw Dr. ONEIDA aylor from plastic surgery who is planning surgery. He is received his okay from pulmonology and he sees cardiology on Wednesday. He has been using silver alginate [name] both wound areas. The area on the left heel actually looks fairly good healthy looking granulation some epithelialization towards the tip of the heel. Right heel is almost completely eschared. 07/28/2023: No significant change [name] the wounds. The MRIs have finally been read and there is no osteomyelitis seen in either heel. His surgery had [name] be rescheduled due [name] concerns from anesthesiology about his post-COVID cardiopulmonary status. He has been cleared by pulmonology, but cardiology wants to do some additional testing in advance of clearing him for surgery. I understand he  is also planning [name] go ahead with bariatric surgery. 08/11/2023: The right wound is deeper with a lot of built-up crust, eschar, and slough. The left wound is smaller with more epithelialization. His echocardiogram is normal and his coronary calcium  score is 0 however the coronary CTA was nondiagnostic due [name] poor opacification of vessels. He has previously been cleared by pulmonology [name] proceed with surgery. Electronic Signature(s) Signed: 08/11/2023 9:28:30 AM By: Marolyn Nest MD FACS Entered By: Marolyn Nest on 08/11/2023 09:28:30 -------------------------------------------------------------------------------- Physical Exam Details Patient Name: Date of Service: [name], [name] [name] 08/11/2023 8:45 A M Medical Record Number: [id number] Patient Account Number: [id number] Date of Birth/Sex: Treating RN: November 14, 1975 (48 y.o. M) Primary Care Provider: Stephane Doffing Other Clinician: Referring Provider: Treating Provider/Extender: Marolyn Nest Stephane Doffing Devra in Treatment: [address], [place] E ([id number]) [id number] Page 8 of 17 Constitutional . . . . no acute distress. Respiratory Normal work of breathing on room  air.. Notes 08/11/2023: The right wound is deeper with a lot of built-up crust, eschar, and slough. The left wound is smaller with more epithelialization. Electronic Signature(s) Signed: 08/11/2023 9:30:55 AM By: Marolyn Nest MD FACS Entered By: Marolyn Nest on 08/11/2023 09:30:55 -------------------------------------------------------------------------------- Physician Orders Details Patient Name: Date of Service: [name], [name] [name] 08/11/2023 8:45 A M Medical Record Number: [id number] Patient Account Number: [id number] Date of Birth/Sex: Treating RN: 1976/01/17 (48 y.o. [name] Primary Care Provider: Stephane Doffing Other Clinician: Referring Provider: Treating Provider/Extender: Marolyn Nest Stephane Doffing Devra in Treatment: 160 The following information was scribed by: Merleen [name] The information was scribed for: Marolyn Nest Verbal / Phone Orders: No Diagnosis Coding ICD-10 Coding Code Description L97.512 Non-pressure chronic ulcer of other part of right foot with fat layer exposed L97.522 Non-pressure chronic ulcer of other part of left foot with fat layer exposed Follow-up Appointments ppointment in 2 weeks. - Dr. Marolyn Return A Anesthetic Wound #2 Left Calcaneus (In clinic) Topical Lidocaine  4% applied [name] wound bed - In clinic Bathing/ Shower/ Hygiene May shower and wash wound with soap and water . Off-Loading Open toe surgical shoe [name]: - with peg assist insert both feet Other: - keep pressure off of the bottom of your feet. Elevate legs throughout the day. Use the Shoe with the PegAssist off-loading insole both feet Additional Orders / Instructions Follow Nutritious Diet - Try [name] get 70-100 grams of Protein a day+ Coupons for Juven sent with patient. Other: - Vitamin C 500 mg per day [name] start, then increase [name] 500mg  twice a day. Also consider adding a zinc  supplement. Juven Shake 1-2 times daily. Wound Treatment Wound #2 - Calcaneus Wound  Laterality: Left Cleanser: Normal Saline (Generic) 1 x Per Day/Other:until healed Discharge Instructions: Cleanse the wound with Normal Saline prior [name] applying a clean dressing using gauze sponges, not tissue or cotton balls. Cleanser: Wound Cleanser (Generic) 1 x Per Day/Other:until healed Discharge Instructions: Cleanse the wound with wound cleanser prior [name] applying a clean dressing using gauze sponges, not tissue or cotton balls. Peri-Wound Care: Ketoconazole Cream 2% (Generic) 1 x Per Day/Other:until healed Discharge Instructions: Apply Ketoconazole mixed with zinc  [name] the periwound Peri-Wound Care: Zinc  Oxide Ointment 30g tube (Generic) 1 x Per Day/Other:until healed [name], [name] ([id number]) [phone].pdf Page 9 of 17 Discharge Instructions: Apply Zinc  Oxide [name] periwound with each dressing change Topical: Gentamicin  (Generic) 1 x Per Day/Other:until healed Discharge Instructions: apply [name] the wound bed Topical: Ketoconazole Cream 2% 1 x Per Day/Other:until healed Discharge Instructions: Apply Ketoconazole  [name] periwound Prim Dressing: Maxorb Extra Ag+ Alginate Dressing, 4x4.75 (in/in) (Generic) 1 x Per Day/Other:until healed ary Discharge Instructions: Apply [name] wound bed as instructed Secondary Dressing: Drawtex 4x4 in (Generic) 1 x Per Day/Other:until healed Discharge Instructions: Apply over primary dressing as directed. Secondary Dressing: Optifoam Non-Adhesive Dressing, 4x4 in (Generic) 1 x Per Day/Other:until healed Discharge Instructions: As needed- Apply over primary dressing as directed. Secondary Dressing: Woven Gauze Sponge, Non-Sterile 4x4 in (Generic) 1 x Per Day/Other:until healed Discharge Instructions: Apply over primary dressing as directed. Secured With: 71M Medipore H Soft Cloth Surgical T ape, 4 x 10 (in/yd) (Generic) 1 x Per Day/Other:until healed Discharge Instructions: Secure with tape as directed. Compression Wrap: Kerlix Roll 4.5x3.1  (in/yd) (Generic) 1 x Per Day/Other:until healed Discharge Instructions: Apply Kerlix and Coban compression as directed. Wound #5 - Foot Wound Laterality: Plantar, Right Peri-Wound Care: Ketoconazole Cream 2% 1 x Per Day/30 Days Discharge Instructions: Apply Ketoconazole [name] periwound as needed Peri-Wound Care: Zinc  Oxide Ointment 30g tube 1 x Per Day/30 Days Discharge Instructions: Apply Zinc  Oxide [name] periwound with each dressing change Topical: Gentamicin  1 x Per Day/30 Days Discharge Instructions: As directed by physician Prim Dressing: Maxorb Extra Ag+ Alginate Dressing, 4x4.75 (in/in) 1 x Per Day/30 Days ary Discharge Instructions: Apply [name] wound bed as instructed Secondary Dressing: Drawtex 4x4 in 1 x Per Day/30 Days Discharge Instructions: As needed -Apply over primary dressing as directed. Secondary Dressing: Optifoam Non-Adhesive Dressing, 4x4 in 1 x Per Day/30 Days Discharge Instructions: Apply over primary dressing as directed. Secured With: [employer], 4.5x3.1 (in/yd) 1 x Per Day/30 Days Discharge Instructions: Secure with Kerlix as directed. Electronic Signature(s) Signed: 08/11/2023 12:14:34 PM By: Marolyn Nest MD FACS Entered By: Marolyn Nest on 08/11/2023 09:31:14 -------------------------------------------------------------------------------- Problem List Details Patient Name: Date of Service: [name], [name] [name] 08/11/2023 8:45 A M Medical Record Number: [id number] Patient Account Number: [id number] Date of Birth/Sex: Treating RN: 09-08-1975 (48 y.o. [name] Primary Care Provider: Stephane Doffing Other Clinician: Referring Provider: Treating Provider/Extender: Marolyn Nest Stephane Doffing Devra in Treatment: [phone] Active Problems ICD-10 Encounter Code Description Active Date MDM ANDERSEN, IORIO ([id number]) [phone].pdf Page 10 of 17 Code Description Active Date MDM Diagnosis L97.512 Non-pressure chronic ulcer of other  part of right foot with fat layer exposed 04/27/2023 No Yes L97.522 Non-pressure chronic ulcer of other part of left foot with fat layer exposed 09/03/2020 No Yes Inactive Problems ICD-10 Code Description Active Date Inactive Date L89.893 Pressure ulcer of other site, stage 3 07/15/2020 07/15/2020 M62.81 Muscle weakness (generalized) 07/15/2020 07/15/2020 I10 Essential (primary) hypertension 07/15/2020 07/15/2020 M86.171 Other acute osteomyelitis, right ankle and foot 09/03/2020 09/03/2020 Resolved Problems Electronic Signature(s) Signed: 08/11/2023 9:26:20 AM By: Marolyn Nest MD FACS Entered By: Marolyn Nest on 08/11/2023 09:26:20 -------------------------------------------------------------------------------- Progress Note Details Patient Name: Date of Service: [name], [name] [place] E. 08/11/2023 8:45 A M Medical Record Number: [id number] Patient Account Number: [id number] Date of Birth/Sex: Treating RN: 1976-05-02 (48 y.o. M) Primary Care Provider: Stephane Doffing Other Clinician: Referring Provider: Treating Provider/Extender: Marolyn Nest Stephane Doffing Devra in Treatment: 160 Subjective Chief Complaint Information obtained from Patient Bilateral Plantar Foot Ulcers History of Present Illness (HPI) Wounds are12/03/2020 upon evaluation today patient presents for initial inspection here in our clinic concerning issues he has been having with the bottoms of his feet bilaterally. He states these actually occurred as wounds when he was hospitalized for 5 months secondary [name] Covid. He was apparently with tilting bed where he was  in an upright position quite frequently and apparently this occurred in some way shape or form during that time. Fortunately there is no sign of active infection at this time. No fevers, chills, nausea, vomiting, or diarrhea. With that being said he still has substantial wounds on the plantar aspects of his feet Theragen require quite a bit of work [name] get these [name] heal. He has  been using Santyl  currently though that is been problematic both in receiving the medication as well as actually paid for it as it is become quite expensive. Prior [name] the experience with Covid the patient really did not have any major medical problems other than hypertension he does have some mild generalized weakness following the Covid experience. 07/22/2020 on evaluation today patient appears [name] be doing okay in regard [name] his foot ulcers I feel like the wound beds are showing signs of better improvement that I do believe the Iodoflex is helping in this regard. With that being said he does have a lot of drainage currently and this is somewhat blue/green in nature which is consistent with Pseudomonas. I do think a culture today would be appropriate for us  [name] evaluate and see if that is indeed the case I would likely start him on antibiotic orally as well he is not allergic [name] Cipro  knows of no issues he has had in the past 12/21; patient was admitted [name] the clinic earlier this month with bilateral presumed pressure ulcers on the bottom of his feet apparently related [name] excessive pressure from a tilt table arrangement in the intensive care unit. Patient relates this [name] being on ECMO but I am not really sure that is exactly related [name] that. I must say I have never seen anything like this. He has fairly extensive full-thickness wounds extending from his heel towards his midfoot mostly centered laterally. There is already been some healing distally. He does not appear [name] have an arterial issue. He has been using gentamicin  [name] the wound surfaces with Iodoflex [name] help with ongoing debridement 1/6; this is a patient with pressure ulcers on the bottom of his feet related [name] excessive pressure from a standing position in the intensive care unit. He is complaining of a lot of pain in the right heel. He is not a diabetic. 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 We have been  using Iodoflex [name] help prepare the surfaces of both wounds for an advanced treatment product. He is nonambulatory spending most of his time in a wheelchair I [name], [name] ([id number]) [id number] Page 11 of 17 have asked him not [name] propel the wheelchair with his heels 1/13; in general his wounds look better not much surface area change we have been using Iodoflex as of last week. I did an x-ray of the right heel as the patient was complaining of pain. I had some thoughts about a stress fracture perhaps Achilles tendon problems however what it showed was erosive changes along the inferior aspect of the calcaneus he now has a MRI booked for 1/20. 1/20; in general his wounds continue [name] be better. Some improvement in the large narrow areas proximally in his foot. He is still complaining of pain in the right heel and tenderness in certain areas of this wound. His MRI is tonight. I am not just looking for osteomyelitis that was brought up on the x-ray I am wondering about stress fractures, tendon ruptures etc. He has no such findings on the left. Also noteworthy is that  the patient had critical illness neuropathy and some of the discomfort may be actual improvement in nerve function I am just not sure. These wounds were initially in the setting of severe critical illness related [name] COVID-19. He was put in a standing position. He may have also been on pressors at the point contributing [name] tissue ischemia. By his description at some point these wounds were grossly necrotic extending proximally up into the Achilles part of his heel. I do not know that I have ever really seen pictures of them like this although they may exist in epic We have ordered Tri layer Oasis. I am trying [name] stimulate some granulation in these areas. This is of course assuming the MRI is negative for infection 1/27; since the patient was last here he saw Dr. Prentiss of infectious disease. He is planned for  vancomycin  and ceftriaxone . Prior operative culture grew MSSA. Also ordered baseline lab work. He also ordered arterial studies although the ABIs in our clinic were normal as well as his clinical exam these were normal I do not think he needs [name] see vascular surgery. His ABIs at the [facility] were 1.22 in the right triphasic waveforms with a normal TBI of 1.15 on the left ABI of 1.22 with triphasic waveforms and a normal TBI of 1.08. Finally he saw Dr. Janit who will follow him in for 2 months. At this point I do not think he felt that he needed a procedure on the right calcaneal bone. Dr. Prentiss is elected for broad-spectrum antibiotic The patient is still having pain in the right heel. He walks with a walker 2/3; wounds are generally smaller. He is tolerating his IV antibiotics. I believe this is vancomycin  and ceftriaxone . We are still waiting for Oasis burn in terms of his out-of-pocket max which he should be meeting soon given the IV antibiotics, MRIs etc. I have asked him [name] check in on this. We are using silver collagen in the meantime the wounds look better 2/10; tolerating IV vancomycin  and Rocephin . We are waiting [name] apply for Oasis. Although I am not really sure where he is in his out-of-pocket max. 2/17 started the first application of Oasis trilayer. Still on antibiotics. The wounds have generally look better. The area on the left has a little more surface slough requiring debridement 2/24; second application of Oasis trilayer. The wound surface granulation is generally look better. The area on the left with undermining laterally I think is come in a bit. 10/08/2020 upon evaluation today patient is here today for [employer] application #3. Fortunately he seems [name] be doing extremely well with regard [name] this and we are seeing a lot of new epithelial growth which is great news. Fortunately there is no signs of active infection at this time. 10/16/2020 upon evaluation today patient appears [name]  be doing well with regard [name] his foot ulcers. Do believe the Oasis has been of benefit for him. I do not see any signs of infection right now which is great news and I think that he has a lot of new epithelial growth which is great [name] see as well. The patient is very pleased [name] hear all of this. I do think we can proceed with the Oasis trilayer #4 today. 3/18; not as much improvement in these areas on his heels that I was hoping. I did reapply trilateral Oasis today the tissue looks healthier but not as much fill in as I was hoping. 3/25; better looking today I think  this is come in a bit the tissue looks healthier. Triple layer Oasis reapplied #6 4/1; somewhat better looking definitely better looking surface not as much change in surface area as I was hoping. He may be spending more time [place] on days then he needs [name] although he does have heel offloading boots. Triple layer Oasis reapplied #7 4/7; unfortunately apparently [employer] will not approve any further Oasis which is unfortunate since the patient did respond nicely both in terms of the condition of the wound bed as well as surface area. There is still some drainage coming from the wound but not a lot there does not appear [name] be any infection 4/15; we have been using Hydrofera Blue. He continues [name] have nice rims of epithelialization on the right greater than the left. The left the epithelialization is coming from the tip of his heel. There is moderate drainage. In this that concerns me about a total contact cast. There is no evidence of infection 4/29; patient has been using Hydrofera Blue with dressing changes. He has no complaints or issues today. 5/5; using Hydrofera Blue. I actually think that he looks marginally better than the last time I saw this 3 weeks ago. There are rims of epithelialization on the left thumb coming from the medial side on the right. Using Hydrofera Blue 5/12; using Hydrofera Blue. These continue [name]  make improvements in surface area. His drainage was not listed as severe I therefore went ahead and put a cast on the left foot. Right foot we will continue [name] dress his previous 5/16; back for first total contact cast change. He did not tolerate this particularly well cast injury on the anterior tibia among other issues. Difficulty sleeping. I talked him about this in some detail and afterwards is elected [name] continue. I told him I would like [name] have a cast on for 3 weeks [name] see if this is going [name] help at all. I think he agreed 5/19; I think the wound is better. There is no tunneling towards his midfoot. The undermining medially also looks better. He has a rim of new skin distally. I think we are making progress here. The area on the left also continues [name] look somewhat better [name] me using Hydrofera Blue. He has a list of complaints about the cast but none of them seem serious 5/26; patient presents for 1 week follow-up. He has been using a total contact cast and tolerating this well. Hydrofera Blue is the main dressing used. He denies signs of infection. 6/2 Hydrofera Blue total contact cast on the left. These were large ulcers that formed in intensive care unit where the patient was recovering from COVID. May have had something to do with being ventilated in an upright positiono Pressors etc. We have been able [name] get the areas down considerably and a viable surface. There is some epithelialization in both sides. Note made of drainage 6/9; changed [name] Hydrofera Blue last time because of drainage. He arrives with better looking surfaces and dimensions on the left than the right. Paradoxically the right actually probes more towards his midfoot the left is largely close down but both of these look improved. Using a total contact cast on the left 6/16; complex wounds on his bilateral plantar heels which were initially pressure injury from a stay in the ICU with COVID. We have been using silver  alginate most recently. His dimensions of come in quite dramatically however not recently. We have been putting the  left foot in a total contact cast 6/23; complex wounds on the bilateral plantar heels. I been putting the left in the cast paradoxically the area on the right is the one that is going towards closure at a faster rate. Quite a bit of drainage on the left. The patient went [name] see Dr. Janit who said he was going [name] standby for skin grafts. I had actually considered sending him for skin grafts however he would be mandatorily off his feet for a period of weeks [name] months. I am thinking that the area on the right is going [name] close on its own the area on the left has been more stubborn even though we have him in a total contact cast 6/30; took him out of a total contact cast last week is the right heel seem [name] be making better progress than the left where I was placing the cast. We are using silver alginate. Both wounds are smaller right greater than left 7/12; both wounds look as though they are making some progress. We are using silver alginate. Heel offloading boots 7/26; very gradual progress especially on the right. Using silver alginate. He is wearing heel offloading boots 8/18; he continues [name] close these wounds down very gradually. Using silver alginate. The problem polymen being definitive about this is areas of what appears [name] be callus around the margins. This is not a 100% of the area but certainly sizable especially on the right 9/1; bilateral plantar feet wounds secondary [name] prolonged pressure while being ventilated for COVID-19 in an upright position. Essentially pressure ulcers on the bottom of his feet. He is made substantial progress using silver alginate. 9/14; bilateral plantar feet wounds secondary [name] prolonged pressure. Making progress using silver alginate. 9/29 bilateral plantar feet wounds secondary [name] prolonged pressure. I changed him [name] Iodoflex last week. MolecuLight  showing reddened blush fluorescence 10/11; patient presents for follow-up. He has no issues or complaints today. He denies signs of infection. He continues [name] use Iodoflex and antibiotic ointment [name] the wound beds. [name], [name] ([id number]) [phone].pdf Page 12 of 17 10/27; 2-week follow-up. No evidence of infection. He has callus and thick dry skin around the wound margins we have been using Iodoflex and Bactroban  which was in response [name] a moderate left MolecuLight reddish blush fluorescence. 11/10; 2-week follow-up. Wound margins again have thick callus however the measurements of the actual wound sites are a lot smaller. Everything looks reasonably healthy here. We have been using Iodoflex He was approved for prime matrix but I have elected [name] delay this given the improvement in the surface area. Hopefully I will not regret that decision as were getting close [name] the end of the year in terms of insurance payment 12/8; 2-week follow-up. Wounds are generally smaller in size. These were initially substantial wounds extending into the forefoot all the way into the heel on the bilateral plantar feet. They are now both located on the plantar heel distal aspect both of these have a lot of callus around the wounds I used a #5 curette [name] remove this on the right and the left also some subcutaneous debris [name] try and get the wound edges were using Iodoflex. He has heel offloading shoe 12/22; 2-week follow-up. Not really much improvement. He has thick callus around the outer edges of both wounds. I remove this there is some nonviable subcutaneous tissue as well. We have been using Iodoflex. Her intake nurse and myself spontaneously thought of a total contact cast I  went back in May. At that time we really were not seeing much of an improvement with a cast although the wound was in a much different situation I would like [name] retry this in 2 weeks and I discussed this with the  patient 08/12/2021; the patient has had some improvement with the Iodoflex. The the area on the left heel plantar more improved than the right. I had [name] put him in a total contact cast on the left although I decided [name] put that off for 2 weeks. I am going [name] change his primary dressing [name] silver collagen. I think in both areas he has had some improvement most of the healing seems [name] be more proximal in the heel. The wounds are in the mid aspect. A lot of thick callus on the right heel however. 1/19; we are using silver collagen on both plantar heel areas. He has had some improvement today. The left did not require any debridement. He still had some eschar on the right that was debrided but both seem [name] have contracted. I did not put it total contact cast on him today 2/2 we have been using silver collagen. The area on the right plantar heel has areas that appear [name] be epithelialized interspersed with dry flaking callus and dry skin. I removed this. This really looks better than on the other side. On the left still a large area with raised edges and debris on the surface. 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 He has no issues or complaints today. 2/9; not much change in the left foot wound with 1 week of a cast we are using silver collagen. Silver collagen on the right side. The right side has been the better wound surface. We will reapply the total contact cast on the left 2/16; not much improvement on either side I been using silver collagen with a total contact cast on the left. I'm changing the Hydrofera Blue still with a total contact cast on the left 2/23; some improvement on both sides. Disappointing that he has thick callus around the area that we are putting in a total contact cast on the left. We've been using Hydrofera Blue on both wound areas. This is a  man who at essentially pressure ulcers in addition [name] ischemia caused by medications [name] support his blood pressure (pressors) in the ICU. He was being ventilated in the standing position for severe Covid. A Shiley the wounds extended across his entire foot but are now localized [name] his plantar heels bilaterally. We have made progress however neither areas healed. I continue [name] think the total contact cast is helped albeit painstakingly slowly. He has never wanted a plastic surgery consult although I don't know that they would be interested in grafting in area in this location. 10/07/2021: Continued improvement bilaterally. There is still some callus around the left wound, despite the total contact cast. He has some increased pain in his right midfoot around 1 particular area. This has been painful in the past but seems [name] be a little bit worse. When his cast was removed today, there was an area on the heel of the left foot that looks a bit macerated. He is also complaining of pain in his left thigh and hip which he thinks is secondary [name] the limb length discrepancy caused by the cast. 10/14/2021: He continues [name]  improve. A little bit less callus around the left wound. He continues [name] endorse pain in his right midfoot, but this is not as significant as it was last week. The maceration on his left heel is improved. 10/21/2021: Continued improvement [name] both wounds. The maceration on his left heel is no longer evident. Less callus bilaterally. Epithelialization progressing. 10/28/2021: Significant improvement this week. The right sided wound is nearly closed with just a small open area at the middle. No maceration seen on the left heel. Continued epithelialization on both sides. No concern for infection. 11/04/2021: T oday, the wounds were measured a little bit differently and come out as larger, but I actually think they are about the same [name] potentially even smaller, particularly on the left. He continues [name]  accumulate some callus on the right. 11/11/2021: T oday, the patient is expressing some concern that the left wound, despite being in the total contact cast, is not progressing at the same rate as the 1 on the right. He is interested in trying a week without the cast [name] see how the wound does. The wounds are roughly the same size as last week, with the right perhaps being a little bit smaller. He continues [name] build up callus on both sites. 11/18/2021: Last week, I permitted the patient [name] go without his total contact cast, just [name] see if the cast was really making any difference. Today, both wounds have deteriorated [name] some extent, suggesting that the cast is providing benefit, at least on the left. Both are larger and have accumulated callus, slough, and other debris. 11/26/2021: I debrided both wounds quite aggressively last week in an effort [name] stimulate the healing cascade. This appears [name] have been effective as the left sided wound is a full centimeter shorter in length. Although the right was measured slightly larger, on inspection, it looks as though an area of epithelialized tissue was included in the measurements. We have been using PolyMem Ag on the wound surfaces with a total contact cast [name] the left. 12/02/2021: It appears that the intake personnel are including epithelialized tissue in his wound measurements; the right wound is almost completely epithelialized; there is just a crater at the proximal midfoot with some open areas. On the left, he has built up some callus, but the overall wound surface looks good. There is some senescent skin around the wound margin. He has been in PolyMem Ag bilaterally with a total contact cast on the left. 12/09/2021: The right wound is nearly closed; there is just a small open area at the mid calcaneus. On the left, the wound is smaller with minimal callus buildup. No significant drainage. 12/16/2021: The right calcaneal wound remains minimally open at the mid  calcaneus; the rest has epithelialized. On the left, the wound is also a little bit smaller. There is some senescent tissue on the periphery. He is getting his first application of a trial skin substitute called Vendaje today. 12/23/2021: The wound on his right calcaneus is nearly closed; there is just a small area at the most distal aspect of the calcaneus that is open. On the left, the area where we applied [name] the skin substitute has a healthier-looking bed of granulation tissue. The wound dimensions are not significantly different on this side but the wound surface is improved. 12/30/2021: The wound on the right calcaneus has not changed significantly aside from some accumulation of callus. On the left, the open area is smaller and continues [name] have an improved surface. He  continues [name] accumulate callus around the wound. He is here for his third application of Vendaje. 01/06/2022: The right calcaneal wound is down [name] just a couple of millimeters. He continues [name] accumulate periwound callus. He unfortunately got his cast wet earlier in the week and his left foot is macerated, resulting in some superficial skin loss just distal [name] the open wound. The open wound itself, however, is much smaller and has a healthier appearing surface. He is here for his fourth application of Vendaje. 01/13/2022: The right calcaneal wound is about the same. Unfortunately, once again, his cast got wet and his foot is again macerated. This is caused the left calcaneal wound [name] enlarge. He is here for his fifth application of Vendaje. TAIKI, BUCKWALTER ([id number]) [phone].pdf Page 13 of 17 01/20/2022: The right calcaneal wound is very small. There is some periwound callus accumulation. He purchased a new cast protector last week and this has been effective in avoiding the maceration that has been occurring on the left. The left calcaneal wound is narrower and has a healthy and viable-appearing surface. He  is here for his 6 application of Vendaje. 01/27/2022: The right calcaneal wound is down [name] just a pinhole. There is some periwound slough and callus. On the left, the wound is narrower and shorter by about a centimeter. The surface is robust and viable-appearing. Unfortunately, the rep for the trial skin substitute product did not provide any for us  [name] use today. 02/04/2022: The right calcaneal wound remains unchanged. There is more accumulated callus. On the left, although the intake nurse measured it a little bit longer, it looks about the same [name] me. The surface has a layer of slough, but underneath this, there is good granulation tissue. 02/10/2022: The right calcaneus wound is nearly closed. There is still some callus that builds up around the site. The left side looks about the same in terms of dimensions, but the surface is more robust and vital-appearing. 02/16/2022: The area of the right calcaneus that was nearly closed last week has closed, but there is a small opening at the mid foot where it looks like some moisture got retained and caused some reopening. The left foot wound is narrower and shallower. Both sites have a fair amount of periwound callus and eschar. 02/24/2022: The small midfoot opening on the right calcaneus is a little bit smaller today. The left foot wound is narrower and shallower. He continues [name] accumulate periwound callus. No concern for infection. 03/01/2022: The patient came [name] clinic early because he showered and got his cast wet. Fortunately, there is no significant maceration [name] his foot but the callus softened and it looks like the wound on his left calcaneus may be a little bit wider. The wound on his right calcaneus is just a narrow slit. Continued accumulation of periwound callus bilaterally. 03/08/2022: The wound on his right calcaneus is very nearly closed, just a small pinpoint opening under a bit of eschar; the left wound has come in quite a bit, as well. It is  narrower and shorter than at our last visit. Still with accumulated callus and eschar bilaterally. 03/17/2022: The right calcaneal wound is healed. The left wound is smaller and the surface itself is very clean, but there is some blue-green staining on the periwound callus, concerning for Pseudomonas aeruginosa. 03/23/2022: The right calcaneal wound remains closed. The left wound continues [name] contract. No further blue-green staining. Small amount of callus and slough accumulation. 03/28/2022: He came in early today because  he had gotten his cast wet. On inspection, the wound itself did not get wet or macerated, just a little bit of the forefoot. The wound itself is basically unchanged. 04/07/2022: The right foot wound remains closed. The left wound is the smallest that I have seen it [name] date. It is narrower and shorter. It still continues [name] accumulate slough on the surface. 04/15/2022: There is a band of epithelium now dividing the small left plantar foot wound in 2. There is still some slough on the surface. 04/21/2022: The wound continues [name] narrow. Just a little bit of slough on the surface. He seems [name] be responding well [name] endoform. 04/28/2022: Continued slow contraction of the wound. There is a little slough on the surface and some periwound callus. We have been using endoform and total contact cast. 05/05/2022: The wound appears [name] have stalled. There is slough and some periwound eschar/callus. No concern for infection, however. 05/12/2022: Unfortunately, his right foot has reopened. It is located at the most posterior aspect of his surgical incision. The area was noted [name] have drainage coming from it when his padding was removed today. Underneath some callus and senescent skin, there is an opening. No purulent drainage or malodor. On the left foot, the wound is again unchanged. There is some light blue staining on the callus, but no malodor or purulent drainage. 10/13; right and left heel remanence  of extensive plantar foot wounds. These are better than I remember by quite a big margin however he is still left with wounds on the left plantar heel and the right plantar heel. Been using endoform bilaterally. A culture was done that showed apparently Pseudomonas but we are still waiting for the [facility] antibiotic [name] use gentamicin  today. He is still very active by description I am not sure about the offloading of his noncasted right foot 10/20; both wounds right and left heel debrided not much change from last week. Patsy has arrived which is linezolid, gentamicin  and ciprofloxacin  we will use this with endoform. T contact cast on the left otal 06/02/2022: Both wounds are smaller today. There is still a fair amount of callus buildup around the right foot ulcer. The left is more superficial and nearly flush with the surrounding tissues. Also with slough and eschar buildup. 06/10/2022: The right sided wound appears [name] be nearly closed, if not completely so, although it is somewhat difficult [name] tell given the abnormal tissue and scarring in his foot. There is a fair amount of callus and crust accumulation. On the left, the wound looks about the same, again with callus and slough. He has an appointment next Thursday with Dr. Malvin in podiatry; I am hopeful that there may be some reconstructive options available for Mr. Wagster. 06/16/2022: Both wounds have some eschar and callus accumulation. The right sided wound is extremely narrow and barely open; the left is narrower than last week. There is a little bit of slough. He has his appointment in podiatry later today. 06/23/2022: The patient met with Dr. Malvin last week and unfortunately, there are no reconstructive options that he believes would be helpful. He did order an MRI [name] evaluate for osteomyelitis and fortunately, none was seen. The left sided wound is a little bit shorter and narrower today. The right sided wound is about the  same. There is callus and eschar accumulation bilaterally. 06/29/2022: Both feet have improved from last week. There is epithelium making a valiant effort [name] creep across the surface on the left. The right  side looks like it got a little dry and the deep crevasse in his midfoot has cracked. Both have eschar and there is some slough on the left. 07/07/2022: Both feet have improved. There is epithelium completely covering the calcaneus on the right with just a small opening in the crevasse in his midfoot. On the left, the open area of tissue is smaller but he continues [name] build up callus/eschar and slough. 07/15/2022: The opening in the midfoot on the right is about the same size, covered with eschar and a little bit of slough. The open portion of the left wound is narrower and shorter with a bit of slough buildup. He admits [name] being on his feet more than recommended. 12/14; as far as I can tell everything on the right foot is closed. There is some eschar I removed some of this I cannot identify any open wound here. As usual this will be a very vulnerable area going forward. On the left this looks really quite healthy. I was pleasantly surprised [name] see how good this looked. Wound is certainly smaller and there appears [name] be healthy epithelialization. He has been using Promogran on the right and endoform on the left. He has been offloading the right foot with a heel offloading boot and he has a running shoe on the right foot 08/04/2022: The right foot remains closed. He has a thick cushioned insole in his sneaker. The left sided wound is smaller with just some slough and eschar accumulation. He is wearing the heel off loader on this foot. TREVAR, BOEHRINGER ([id number]) [phone].pdf Page 14 of 17 08/15/2022: The right foot remains closed. The left wound has narrowed further. There is some slough and eschar accumulation. 08/25/2022: We put him in a peg assist shoe insert and as a  result, he has more epithelialization of the ulcer with minimal slough and eschar accumulation. 09/01/2022: The wound is smaller by about half this week. Still with some slough on the surface. The peg assist shoe seems [name] be doing a remarkable job of adequately offloading the site. 09/08/2022: There is a little bit more epithelium coming in. There is some slough and callus buildup. 09/16/2022: The wound measures about the same size, but the epithelium that has grown in looks more robust and stronger. There is some slough and callus buildup. 09/23/2022: The wound remains about the same size. The skin edges are looking rather senescent. 09/29/2022: The aggressive debridement I performed last week seems [name] have been effective. The wound is smaller and has significantly less slough accumulation. 10/06/2022: There has been quite a bit of epithelialization since last week. There are still some open areas with slough accumulation. There is some callus buildup around the perimeter. 10/13/2022: Continued improvement. Minimal callus accumulation. 3/14; patient presents for follow-up. He has been using endoform [name] the wound bed without issues. He is using a surgical shoe with peg assist for offloading. 10/27/2022: The wound dimensions are stable. There is some senescent skin accumulation around the perimeter. 11/04/2022: Last week I performed a very aggressive debridement in an effort [name] stimulate the healing cascade. As has been the case when I have done this before, the wound has responded well. There has been epithelialization and contraction of the wound. There are just a couple of small open areas. 11/10/2022: Unfortunately, his foot got wet secondary [name] sweating and there has been some breakdown of the tissue, particularly the skin just adjacent [name] the main ulcer. 11/18/2022: We continue [name] struggle with moisture-related tissue  breakdown around the perimeter of his wound. This has caused the thin epithelium that  had formed on the surface [name] disintegrate. The underlying surface of the wound has good granulation tissue. 11/25/2022: The edges of the wound are much less macerated, but the surface is actually looking a little bit dry. There is slough accumulation. No obvious signs of infection. 12/01/2022: The wound edges are more macerated and broken down, making the wound larger. The surface has some slough on it. 12/08/2020: The wound looks better this week. There is significantly less maceration and moisture-related tissue breakdown. There is some slough on the wound surface. 12/15/2022: The wound continues [name] improve. There is almost no moisture-related tissue breakdown. There is epithelium beginning [name] fill in again from the edges. Light slough on the wound surface. 12/22/2022: More epithelium has filled him from around the edges. There is some slough on the surface. No evidence of moisture-related tissue breakdown. 01/05/2023: There has been more epithelialization, particularly at the posterior calcaneal aspect of the wound. There has been a little bit of moisture accumulation with minimal maceration. 01/12/2023: More epithelium has filled in. There is very minimal accumulation of slough. 01/20/2023: The wound is measuring a little bit smaller today. It did measure deeper, but this appears [name] be secondary [name] some heaped up senescent skin around the edges. The epithelium that has been present at the posterior aspect of the wound seems [name] be a bit more robust with greater integrity. 01/26/2023: He reports intense itching [name] the skin around the wound and there has been some breakdown here. It looks fungal in nature. The wound itself looks pretty good with improved epithelialization with just some slough buildup. He has a little callus along the wound edges. 02/02/2023: He has been treating the periwound skin with an over-the-counter athlete's foot medication and notes significant improvement in his pruritus. The skin  looks better here, as well. The wound continues [name] epithelialize. There is light slough accumulation. 02/10/2023: The periwound skin still looks fairly angry. He reports multiple small blisters that have opened up. He is not having the intense itching that he had prior, however. There has been more epithelialization at the calcaneus and there is minimal slough accumulation. 02/16/2023: The periwound skin is improving. An online review of dermatology rash images suggest that this may be hand-foot-and-mouth disease, isolated [name] the feet; the patient says he actually had this as a child. The calcaneus continues [name] epithelialize and the tissue was quite robust here. Very minimal slough accumulation on the open portion of the wound with a little bit of periwound callus buildup. 03/03/2023: The wound measurements are about the same, but there is more epithelium making its way [name] the surface. There is a little bit of crust and eschar around the edges and minimal slough on the surface. 03/09/2023: His dressing was applied rather haphazardly and the drawtex was not applied. The Optifoam was also reversed such that the orange side was in contact with his bare skin. He has some maceration around the wound edges, but this did not appear [name] result in any tissue breakdown. There is a little bit of slough on the surface and minimal eschar around the edges. 03/15/2023: The wound measured narrower today. There is some slough accumulation on the surface and some eschar around the edges. 03/23/2023: The length of the wound remains stable, but it continues [name] narrow. The skin that has covered the posterior aspect of the calcaneus is more robust and has better tensile integrity. Minimal slough  and eschar accumulation. 03/30/2023: The wound length has decreased. This seems [name] be primarily due [name] more epithelium covering the posterior aspect of the site. Minimal slough and eschar buildup. 04/07/2023: The wound measured a little bit  smaller again today. There is minimal slough accumulation with just a little bit of eschar and callus around the edges. 04/13/2023: The lateral aspect of his wound is starting [name] epithelialize. There is very minimal slough on the surface. Moisture control is good. 04/20/2023: Continued, albeit extremely slow, encroachment of epithelium as of the wound surface. Minimal slough. Moisture balance is good. 04/27/2023: More epithelium is filling in from the heel area. There is a little bit of slough on the surface with some periwound callus and eschar. He did note a LORANZO, DESHA ([id number]) [id number] Page 15 of 17 little bit of drainage coming from his right foot and there is a small crack in the skin extending medially from the main site of his prior ulceration. 05/04/2023: The wounds are stable this week. No significant change. There is a little bit of callus buildup around the edges of each site and some thin slough on the surface on the left foot. 05/11/2023: The skin on the right heel is filling in further. There is a little bit of slough accumulation on both surfaces along with periwound callus accumulation. 05/18/2023: Both wounds are smaller today. The right foot is down [name] just a sliver of opening. 05/25/2023: Epithelium continues [name] advance from the calcaneal area towards the midfoot on the left. On the right, there is tiniest crack of an opening that remains. 06/01/2023: No significant change bilaterally. He has built up quite a bit of callus on the calcaneal surface of the right foot. 06/15/2023: He missed his appointment last week. The wounds measured larger today. There is some slough and eschar accumulation on both wounds. He was discussed in the [facility] wound camp last week. The consensus was in favor of proceeding with plastic surgery intervention but they also suggested repeating the MRI imaging of his heels [name] ensure no osteomyelitis present. 06/22/2023: The  wounds are basically stable. His MRI is scheduled for tomorrow and his surgery for a week from tomorrow, 22 November. 12/9; patient I know from some months ago. 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 He saw Dr. ONEIDA aylor from plastic surgery who is planning surgery. He is received his okay from pulmonology and he sees cardiology on Wednesday. He has been using silver alginate [name] both wound areas. The area on the left heel actually looks fairly good healthy looking granulation some epithelialization towards the tip of the heel. Right heel is almost completely eschared. 07/28/2023: No significant change [name] the wounds. The MRIs have finally been read and there is no osteomyelitis seen in either heel. His surgery had [name] be rescheduled due [name] concerns from anesthesiology about his post-COVID cardiopulmonary status. He has been cleared by pulmonology, but cardiology wants to do some additional testing in advance of clearing him for surgery. I understand he is also planning [name] go ahead with bariatric surgery. 08/11/2023: The right wound is deeper with a lot of built-up crust, eschar, and slough. The left wound is smaller with more epithelialization. His echocardiogram is normal and his coronary calcium  score is 0 however the coronary CTA was nondiagnostic due [name] poor opacification of vessels. He has previously been cleared by pulmonology [name] proceed with surgery. Objective Constitutional no acute distress. Vitals Time Taken: 9:04 AM, Height: 69 in,  Weight: 280 lbs, BMI: 41.3, Temperature: 98.2 F, Pulse: 72 bpm, Respiratory Rate: 16 breaths/min, Blood Pressure: 118/66 mmHg. Respiratory Normal work of breathing on room air.. General Notes: 08/11/2023: The right wound is deeper with a lot of built-up crust, eschar, and slough. The left wound is smaller with more epithelialization. Integumentary (Hair, Skin) Wound #2 status is Open. Original cause of wound was Pressure Injury.  The date acquired was: 10/07/2019. The wound has been in treatment 160 weeks. The wound is located on the Left Calcaneus. The wound measures 2.1cm length x 1cm width x 0.1cm depth; 1.649cm^2 area and 0.165cm^3 volume. There is Fat Layer (Subcutaneous Tissue) exposed. There is no tunneling or undermining noted. There is a medium amount of serosanguineous drainage noted. The wound margin is distinct with the outline attached [name] the wound base. There is large (67-100%) pink granulation within the wound bed. There is a small (1-33%) amount of necrotic tissue within the wound bed including Adherent Slough. The periwound skin appearance had no abnormalities noted for moisture. The periwound skin appearance had no abnormalities noted for color. The periwound skin appearance exhibited: Callus, Scarring. The periwound skin appearance did not exhibit: Crepitus, Excoriation, Induration, Rash. Periwound temperature was noted as No Abnormality. The periwound has tenderness on palpation. Wound #5 status is Open. Original cause of wound was Gradually Appeared. The date acquired was: 04/27/2023. The wound has been in treatment 15 weeks. The wound is located on the Right,Plantar Foot. The wound measures 1.5cm length x 0.5cm width x 0.6cm depth; 0.589cm^2 area and 0.353cm^3 volume. There is Fat Layer (Subcutaneous Tissue) exposed. There is no tunneling or undermining noted. There is a medium amount of serosanguineous drainage noted. The wound margin is distinct with the outline attached [name] the wound base. There is small (1-33%) pink, pale granulation within the wound bed. There is a small (1- 33%) amount of necrotic tissue within the wound bed including Eschar and Adherent Slough. The periwound skin appearance had no abnormalities noted for color. The periwound skin appearance exhibited: Callus, Scarring, Dry/Scaly. The periwound skin appearance did not exhibit: Rash. Periwound temperature was noted as No Abnormality. The  periwound has tenderness on palpation. Assessment Active Problems ICD-10 Non-pressure chronic ulcer of other part of right foot with fat layer exposed Non-pressure chronic ulcer of other part of left foot with fat layer exposed [name], [name] ([id number]) [phone].pdf Page 16 of 17 Procedures Wound #2 Pre-procedure diagnosis of Wound #2 is a Pressure Ulcer located on the Left Calcaneus . There was a Excisional Skin/Subcutaneous Tissue Debridement with a total area of 1.65 sq cm performed by Marolyn Nest, MD. With the following instrument(s): Curette [name] remove Viable and Non-Viable tissue/material. Material removed includes Eschar, Subcutaneous Tissue, and Slough after achieving pain control using Lidocaine  4% T opical Solution. No specimens were taken. A time out was conducted at 09:20, prior [name] the start of the procedure. A Minimum amount of bleeding was controlled with Pressure. The procedure was tolerated well with a pain level of 0 throughout and a pain level of 0 following the procedure. Post Debridement Measurements: 2.1cm length x 1cm width x 0.1cm depth; 0.165cm^3 volume. Post debridement Stage noted as Category/Stage III. Character of Wound/Ulcer Post Debridement is improved. Post procedure Diagnosis Wound #2: Same as Pre-Procedure Wound #5 Pre-procedure diagnosis of Wound #5 is a Pressure Ulcer located on the Right,Plantar Foot . There was a Excisional Skin/Subcutaneous Tissue Debridement with a total area of 0.74 sq cm performed by Marolyn Nest, MD. With  the following instrument(s): Curette [name] remove Viable and Non-Viable tissue/material. Material removed includes Eschar, Callus, Subcutaneous Tissue, Slough, and Skin: Epidermis after achieving pain control using Lidocaine  4% T opical Solution. No specimens were taken. A time out was conducted at 09:20, prior [name] the start of the procedure. A Minimum amount of bleeding was controlled with  Pressure. The procedure was tolerated well with a pain level of 0 throughout and a pain level of 0 following the procedure. Post Debridement Measurements: 1.5cm length x 0.5cm width x 0.6cm depth; 0.353cm^3 volume. Post debridement Stage noted as Category/Stage II. Character of Wound/Ulcer Post Debridement is improved. Post procedure Diagnosis Wound #5: Same as Pre-Procedure Plan Follow-up Appointments: Return Appointment in 2 weeks. - Dr. Marolyn Anesthetic: Wound #2 Left Calcaneus: (In clinic) Topical Lidocaine  4% applied [name] wound bed - In clinic Bathing/ Shower/ Hygiene: May shower and wash wound with soap and water . Off-Loading: Open toe surgical shoe [name]: - with peg assist insert both feet Other: - keep pressure off of the bottom of your feet. Elevate legs throughout the day. Use the Shoe with the PegAssist off-loading insole both feet Additional Orders / Instructions: Follow Nutritious Diet - Try [name] get 70-100 grams of Protein a day+ Coupons for Juven sent with patient. Other: - Vitamin C 500 mg per day [name] start, then increase [name] 500mg  twice a day. Also consider adding a zinc  supplement. Juven Shake 1-2 times daily. WOUND #2: - Calcaneus Wound Laterality: Left Cleanser: Normal Saline (Generic) 1 x Per Day/Other:until healed Discharge Instructions: Cleanse the wound with Normal Saline prior [name] applying a clean dressing using gauze sponges, not tissue or cotton balls. Cleanser: Wound Cleanser (Generic) 1 x Per Day/Other:until healed Discharge Instructions: Cleanse the wound with wound cleanser prior [name] applying a clean dressing using gauze sponges, not tissue or cotton balls. Peri-Wound Care: Ketoconazole Cream 2% (Generic) 1 x Per Day/Other:until healed Discharge Instructions: Apply Ketoconazole mixed with zinc  [name] the periwound Peri-Wound Care: Zinc  Oxide Ointment 30g tube (Generic) 1 x Per Day/Other:until healed Discharge Instructions: Apply Zinc  Oxide [name] periwound with each dressing  change Topical: Gentamicin  (Generic) 1 x Per Day/Other:until healed Discharge Instructions: apply [name] the wound bed Topical: Ketoconazole Cream 2% 1 x Per Day/Other:until healed Discharge Instructions: Apply Ketoconazole [name] periwound Prim Dressing: Maxorb Extra Ag+ Alginate Dressing, 4x4.75 (in/in) (Generic) 1 x Per Day/Other:until healed ary Discharge Instructions: Apply [name] wound bed as instructed Secondary Dressing: Drawtex 4x4 in (Generic) 1 x Per Day/Other:until healed Discharge Instructions: Apply over primary dressing as directed. Secondary Dressing: Optifoam Non-Adhesive Dressing, 4x4 in (Generic) 1 x Per Day/Other:until healed Discharge Instructions: As needed- Apply over primary dressing as directed. Secondary Dressing: Woven Gauze Sponge, Non-Sterile 4x4 in (Generic) 1 x Per Day/Other:until healed Discharge Instructions: Apply over primary dressing as directed. Secured With: 75M Medipore H Soft Cloth Surgical T ape, 4 x 10 (in/yd) (Generic) 1 x Per Day/Other:until healed Discharge Instructions: Secure with tape as directed. Com pression Wrap: Kerlix Roll 4.5x3.1 (in/yd) (Generic) 1 x Per Day/Other:until healed Discharge Instructions: Apply Kerlix and Coban compression as directed. WOUND #5: - Foot Wound Laterality: Plantar, Right Peri-Wound Care: Ketoconazole Cream 2% 1 x Per Day/30 Days Discharge Instructions: Apply Ketoconazole [name] periwound as needed Peri-Wound Care: Zinc  Oxide Ointment 30g tube 1 x Per Day/30 Days Discharge Instructions: Apply Zinc  Oxide [name] periwound with each dressing change Topical: Gentamicin  1 x Per Day/30 Days Discharge Instructions: As directed by physician Prim Dressing: Maxorb Extra Ag+ Alginate Dressing, 4x4.75 (in/in) 1 x  Per Day/30 Days ary Discharge Instructions: Apply [name] wound bed as instructed Secondary Dressing: Drawtex 4x4 in 1 x Per Day/30 Days Discharge Instructions: As needed -Apply over primary dressing as directed. Secondary Dressing:  Optifoam Non-Adhesive Dressing, 4x4 in 1 x Per Day/30 Days Discharge Instructions: Apply over primary dressing as directed. Secured With: [employer], 4.5x3.1 (in/yd) 1 x Per Day/30 Days Discharge Instructions: Secure with Kerlix as directed. 08/11/2023: The right wound is deeper with a lot of built-up crust, eschar, and slough. The left wound is smaller with more epithelialization. His echocardiogram is normal and his coronary calcium  score is 0 however the coronary CTA was nondiagnostic due [name] poor opacification of vessels. He has previously been AIKEN, WITHEM ([id number]) [id number] Page 17 of 17 cleared by pulmonology [name] proceed with surgery. I used a curette [name] debride slough and subcutaneous tissue from the left foot and slough, callus, eschar, and subcutaneous tissue from the right foot. We are using topical gentamicin  due [name] chronic and repeated Pseudomonas infections with silver alginate and drawtex bilaterally. He continues [name] wear a sneaker on his right foot and I think this is contributing [name] the increased depth of the wound and failure [name] make significant progress. I am going [name] put him in a peg assist offloading insert on this foot, so he will be wearing them bilaterally. It sounds like from a cardiopulmonary standpoint, he is likely ready [name] go ahead with his surgery, but obviously that will be deferred [name] plastics regarding the timing. Follow-up with me in 1 week. Electronic Signature(s) Signed: 08/11/2023 9:32:38 AM By: Marolyn Nest MD FACS Entered By: Marolyn Nest on 08/11/2023 09:32:38 -------------------------------------------------------------------------------- SuperBill Details Patient Name: Date of Service: [name], [name] [name] 08/11/2023 Medical Record Number: [id number] Patient Account Number: [id number] Date of Birth/Sex: Treating RN: Mar 11, 1976 (48 y.o. M) Primary Care Provider: Stephane Doffing Other Clinician: Referring  Provider: Treating Provider/Extender: Marolyn Nest Stephane Doffing Devra in Treatment: 160 Diagnosis Coding ICD-10 Codes Code Description [phone] Non-pressure chronic ulcer of other part of right foot with fat layer exposed L97.522 Non-pressure chronic ulcer of other part of left foot with fat layer exposed Facility Procedures : CPT4 Code: [id number] Description: 11042 - DEB SUBQ TISSUE 20 SQ CM/< ICD-10 Diagnosis Description L97.512 Non-pressure chronic ulcer of other part of right foot with fat layer exposed L97.522 Non-pressure chronic ulcer of other part of left foot with fat layer exposed Modifier: Quantity: 1 Physician Procedures : CPT4 Code Description Modifier [id number] - WC PHYS LEVEL 4 - EST PT ICD-10 Diagnosis Description L97.512 Non-pressure chronic ulcer of other part of right foot with fat layer exposed L97.522 Non-pressure chronic ulcer of other part of left foot  with fat layer exposed Quantity: 1 : [id number] 11042 - WC PHYS SUBQ TISS 20 SQ CM ICD-10 Diagnosis Description L97.512 Non-pressure chronic ulcer of other part of right foot with fat layer exposed L97.522 Non-pressure chronic ulcer of other part of left foot with fat layer exposed Quantity: 1 Electronic Signature(s) Signed: 08/11/2023 9:32:54 AM By: Marolyn Nest MD FACS Entered By: Marolyn Nest on 08/11/2023 09:32:54

## 2023-08-14 ENCOUNTER — Telehealth: Payer: Self-pay

## 2023-08-14 ENCOUNTER — Encounter: Payer: PPO | Admitting: Surgical

## 2023-08-14 DIAGNOSIS — R0602 Shortness of breath: Secondary | ICD-10-CM

## 2023-08-14 NOTE — Telephone Encounter (Signed)
 Pt states that he has not received his results from his cardiac CT. Please advise

## 2023-08-16 NOTE — Addendum Note (Signed)
 Addended by: Eleonore Chiquito on: 08/16/2023 11:12 AM   Modules accepted: Orders

## 2023-08-16 NOTE — Addendum Note (Signed)
 Addended by: Belva Crome R on: 08/16/2023 11:47 AM   Modules accepted: Orders

## 2023-08-16 NOTE — Telephone Encounter (Signed)
 Recommendations reviewed with pt as per Dr. Kem Parkinson note.  Instructions reviewed with pt and sent by MyChart. Pt verbalized understanding and had no additional questions.

## 2023-08-17 NOTE — Progress Notes (Signed)
 KAMDIN, FOLLETT ([id number]) [phone].pdf Page 1 of 9 Visit Report for 08/11/2023 Arrival Information Details Patient Name: Date of Service: [name], [name] [name] 08/11/2023 8:45 A M Medical Record Number: [id number] Patient Account Number: [id number] Date of Birth/Sex: Treating RN: 15-Dec-1975 (48 y.o. [name] Primary Care Daneille Desilva: Stephane Doffing Other Clinician: Referring Zailah Zagami: Treating Rayn Shorb/Extender: Marolyn Delon Stephane Doffing Devra in Treatment: 160 Visit Information History Since Last Visit Added or deleted any medications: No Patient Arrived: Wheel Chair Any new allergies or adverse reactions: No Arrival Time: 08:55 Had a fall or experienced change in No Accompanied By: son activities of daily living that may affect Transfer Assistance: Manual risk of falls: Patient Identification Verified: Yes Signs or symptoms of abuse/neglect since last visito No Secondary Verification Process Completed: Yes Hospitalized since last visit: No Patient Requires Transmission-Based Precautions: No Implantable device outside of the clinic excluding No Patient Has Alerts: No cellular tissue based products placed in the center since last visit: Has Dressing in Place as Prescribed: Yes Has Footwear/Offloading in Place as Prescribed: Yes Left: Wedge Shoe Right: Wedge Shoe Pain Present Now: No Electronic Signature(s) Signed: 08/11/2023 12:56:04 PM By: Drury Nestle RN, BSN Entered By: Drury [name] on 08/11/2023 08:59:54 -------------------------------------------------------------------------------- Encounter Discharge Information Details Patient Name: Date of Service: [name], [name] [place] E. 08/11/2023 8:45 A M Medical Record Number: [id number] Patient Account Number: [id number] Date of Birth/Sex: Treating RN: Dec 10, 1975 (48 y.o. [name] Claven Pollen Primary Care Soua Caltagirone: Stephane Doffing Other Clinician: Referring Chynah Orihuela: Treating Yaser Harvill/Extender: Marolyn Delon Stephane Doffing Devra in Treatment: 160 Encounter Discharge Information Items Post Procedure Vitals Discharge Condition: Stable Temperature (F): 98.2 Ambulatory Status: Wheelchair Pulse (bpm): 72 Discharge Destination: Home Respiratory Rate (breaths/min): 16 Transportation: Private Auto Blood Pressure (mmHg): 118/66 Accompanied By: son Schedule Follow-up Appointment: Yes Clinical Summary of Care: Patient Declined Electronic Signature(s) Signed: 08/11/2023 10:09:12 AM By: Claven Pollen RN Entered By: Claven Pollen on 08/11/2023 10:07:39 [name] ([id number]) [id number] Page 2 of 9 -------------------------------------------------------------------------------- Lower Extremity Assessment Details Patient Name: Date of Service: [name] FISHERMAN MAINE 08/11/2023 8:45 A M Medical Record Number: [id number] Patient Account Number: [id number] Date of Birth/Sex: Treating RN: [birth year] (48 y.o. [name] Drury, [name] Primary Care Trayveon Beckford: Stephane Doffing Other Clinician: Referring Tyrion Glaude: Treating Chelsea Nusz/Extender: Marolyn Delon Stephane Doffing Devra in Treatment: 160 Edema Assessment Assessed: [name]: Yes] [name]: Yes] Edema: [Left: Yes] [Right: Yes] Calf Left: Right: Point of Measurement: 29 cm From Medial Instep 42 cm 44 cm Ankle Left: Right: Point of Measurement: 9 cm From Medial Instep 26 cm 24 cm Vascular Assessment Pulses: Dorsalis Pedis Palpable: [Left:Yes] [Right:Yes] Extremity colors, hair growth, and conditions: Extremity Color: [Left:Normal] [Right:Normal] Hair Growth on Extremity: [Left:Yes] [Right:Yes] Temperature of Extremity: [Left:Warm] Capillary Refill: [Left:< 3 seconds] [Right:< 3 seconds] Dependent Rubor: [Left:No] [Right:No] Blanched when Elevated: [Left:No No] [Right:No No] Toe Nail Assessment Left: Right: Thick: No No Discolored: No No Deformed: No No Improper Length and Hygiene: No No Electronic Signature(s) Signed:  08/11/2023 12:56:04 PM By: Drury Nestle RN, BSN Entered By: Drury [name] on 08/11/2023 09:00:57 -------------------------------------------------------------------------------- Multi Wound Chart Details Patient Name: Date of Service: [name], [name] [place] E. 08/11/2023 8:45 A M Medical Record Number: [id number] Patient Account Number: [id number] Date of Birth/Sex: Treating RN: 14-Nov-1975 (48 y.o. M) Primary Care Danyon Mcginness: Stephane Doffing Other Clinician: Referring Kerney Hopfensperger: Treating Tikita Mabee/Extender: Marolyn Delon Stephane Doffing Devra in Treatment: 160 Vital Signs Height(in): 69 Pulse(bpm): 72 Weight(lbs): 280 Blood Pressure(mmHg): 118/66 [name], [name] ([id number]) [phone].pdf Page 3 of  9 Body Mass Index(BMI): 41.3 Temperature(F): 98.2 Respiratory Rate(breaths/min): 16 [2:Photos:] [N/A:N/A] Left Calcaneus Right, Plantar Foot N/A Wound Location: Pressure Injury Gradually Appeared N/A Wounding Event: Pressure Ulcer Pressure Ulcer N/A Primary Etiology: Asthma, Angina, Hypertension Asthma, Angina, Hypertension N/A Comorbid History: 10/07/2019 04/27/2023 N/A Date Acquired: 160 15 N/A Weeks of Treatment: Open Open N/A Wound Status: No No N/A Wound Recurrence: 2.1x1x0.1 1.5x0.5x0.6 N/A Measurements L x W x D (cm) 1.649 0.589 N/A A (cm) : rea 0.165 0.353 N/A Volume (cm) : 93.90% -1800.00% N/A % Reduction in A rea: 99.40% -11666.70% N/A % Reduction in Volume: Category/Stage III Category/Stage II N/A Classification: Medium Medium N/A Exudate A mount: Serosanguineous Serosanguineous N/A Exudate Type: red, brown red, brown N/A Exudate Color: Distinct, outline attached Distinct, outline attached N/A Wound Margin: Large (67-100%) Small (1-33%) N/A Granulation A mount: Pink Pink, Pale N/A Granulation Quality: Small (1-33%) Small (1-33%) N/A Necrotic A mount: Adherent Slough Eschar, Adherent Slough N/A Necrotic Tissue: Fat Layer (Subcutaneous Tissue):  Yes Fat Layer (Subcutaneous Tissue): Yes N/A Exposed Structures: Fascia: No Fascia: No Tendon: No Tendon: No Muscle: No Muscle: No Joint: No Joint: No Bone: No Bone: No Medium (34-66%) Medium (34-66%) N/A Epithelialization: Debridement - Excisional Debridement - Excisional N/A Debridement: Pre-procedure Verification/Time Out 09:20 09:20 N/A Taken: Lidocaine  4% Topical Solution Lidocaine  4% Topical Solution N/A Pain Control: Necrotic/Eschar, Subcutaneous, Necrotic/Eschar, Callus, N/A Tissue Debrided: Slough Subcutaneous, Slough Skin/Subcutaneous Tissue Skin/Subcutaneous Tissue N/A Level: 1.65 0.74 N/A Debridement A (sq cm): rea Curette Curette N/A Instrument: Minimum Minimum N/A Bleeding: Pressure Pressure N/A Hemostasis Achieved: 0 0 N/A Procedural Pain: 0 0 N/A Post Procedural Pain: Debridement Treatment Response: Procedure was tolerated well Procedure was tolerated well N/A Post Debridement Measurements L x 2.1x1x0.1 1.5x0.5x0.6 N/A W x D (cm) 0.165 0.353 N/A Post Debridement Volume: (cm) Category/Stage III Category/Stage II N/A Post Debridement Stage: Callus: Yes Callus: Yes N/A Periwound Skin Texture: Scarring: Yes Scarring: Yes Excoriation: No Rash: No Induration: No Crepitus: No Rash: No Dry/Scaly: Yes Dry/Scaly: Yes N/A Periwound Skin Moisture: Maceration: No Atrophie Blanche: No Hemosiderin Staining: No N/A Periwound Skin Color: Cyanosis: No Ecchymosis: No Erythema: No Hemosiderin Staining: No Mottled: No Pallor: No Rubor: No No Abnormality No Abnormality N/A Temperature: Yes Yes N/A Tenderness on Palpation: Debridement Debridement N/A Procedures Performed: Treatment Notes [name], [name] ([id number]) [phone].pdf Page 4 of 9 Electronic Signature(s) Signed: 08/11/2023 9:26:30 AM By: Marolyn Nest MD FACS Entered By: Marolyn Nest on 08/11/2023  09:26:30 -------------------------------------------------------------------------------- Multi-Disciplinary Care Plan Details Patient Name: Date of Service: [name], [name] [name] 08/11/2023 8:45 A M Medical Record Number: [id number] Patient Account Number: [id number] Date of Birth/Sex: Treating RN: 1975/12/04 (48 y.o. [name] Merleen Handing Primary Care Srinidhi Landers: Stephane Doffing Other Clinician: Referring Kashira Behunin: Treating Gladiola Madore/Extender: Marolyn Nest Stephane Doffing Devra in Treatment: 160 Multidisciplinary Care Plan reviewed with physician Active Inactive Wound/Skin Impairment Nursing Diagnoses: Impaired tissue integrity Knowledge deficit related [name] ulceration/compromised skin integrity Goals: Patient/caregiver will verbalize understanding of skin care regimen Date Initiated: 07/15/2020 Target Resolution Date: 10/06/2023 Goal Status: Active Ulcer/skin breakdown will have a volume reduction of 30% by week 4 Date Initiated: 07/15/2020 Date Inactivated: 08/20/2020 Target Resolution Date: 09/03/2020 Goal Status: Unmet Unmet Reason: no major changes. Ulcer/skin breakdown will heal within 14 weeks Date Initiated: 12/04/2020 Date Inactivated: 12/10/2020 Target Resolution Date: 12/10/2020 Unmet Reason: wounds still open at 14 Goal Status: Unmet weeks and today 21 weeks. Interventions: Assess patient/caregiver ability [name] obtain necessary supplies Assess patient/caregiver ability [name] perform ulcer/skin care regimen upon admission  and as needed Assess ulceration(s) every visit Provide education on ulcer and skin care Treatment Activities: Skin care regimen initiated : 07/15/2020 Topical wound management initiated : 07/15/2020 Notes: Electronic Signature(s) Signed: 08/11/2023 12:47:24 PM By: Merleen Handing RN, BSN Entered By: Boehlein, Linda on 08/11/2023 09:25:32 -------------------------------------------------------------------------------- Pain Assessment Details Patient Name: Date of  Service: [name], [name] [place] E. 08/11/2023 8:45 A M Medical Record Number: [id number] Patient Account Number: [id number] Date of Birth/Sex: Treating RN: 1975/12/13 (48 y.o. Ramin, Zoll, [name] ([id number]) [phone].pdf Page 5 of 9 Primary Care Knight Oelkers: Stephane Doffing Other Clinician: Referring Eddrick [name]: Treating Braycen Burandt/Extender: Marolyn Delon Stephane Doffing Devra in Treatment: 160 Active Problems Location of Pain Severity and Description of Pain Patient Has Paino No Site Locations Pain Management and Medication Current Pain Management: Electronic Signature(s) Signed: 08/11/2023 12:56:04 PM By: Drury Armida RN, BSN Entered By: Drury Armida on 08/11/2023 09:00:15 -------------------------------------------------------------------------------- Patient/Caregiver Education Details Patient Name: Date of Service: [name] [name] 1/3/2025andnbsp8:45 A M Medical Record Number: [id number] Patient Account Number: [id number] Date of Birth/Gender: Treating RN: Jun 24, 1976 (48 y.o. [name] Claven Pollen Primary Care Physician: Stephane Doffing Other Clinician: Referring Physician: Treating Physician/Extender: Marolyn Delon Stephane Doffing Devra in Treatment: 160 Education Assessment Education Provided [name]: Patient Education Topics Provided Wound/Skin Impairment: Methods: Explain/Verbal Responses: State content correctly [employer]) Signed: 08/11/2023 12:47:24 PM By: Merleen Handing RN, BSN Entered By: Boehlein, Linda on 08/11/2023 09:25:42 [name] ([id number]) [id number] Page 6 of 9 -------------------------------------------------------------------------------- Wound Assessment Details Patient Name: Date of Service: [name] [name] MAINE 08/11/2023 8:45 A M Medical Record Number: [id number] Patient Account Number: [id number] Date of Birth/Sex: Treating RN: Jun 06, 1976 (48 y.o. [name] Drury Armida Primary Care Jodene Polyak: Stephane Doffing Other Clinician: Referring Iann Rodier: Treating Alashia Brownfield/Extender: Marolyn Delon Stephane Doffing Devra in Treatment: 160 Wound Status Wound Number: 2 Primary Etiology: Pressure Ulcer Wound Location: Left Calcaneus Wound Status: Open Wounding Event: Pressure Injury Comorbid History: Asthma, Angina, Hypertension Date Acquired: 10/07/2019 Weeks Of Treatment: 160 Clustered Wound: No Photos Wound Measurements Length: (cm) 2 Width: (cm) 1 Depth: (cm) 0 Area: (cm) Volume: (cm) .1 % Reduction in Area: 93.9% % Reduction in Volume: 99.4% .1 Epithelialization: Medium (34-66%) 1.649 Tunneling: No 0.165 Undermining: No Wound Description Classification: Category/Stage III Wound Margin: Distinct, outline attached Exudate Amount: Medium Exudate Type: Serosanguineous Exudate Color: red, brown Foul Odor After Cleansing: No Slough/Fibrino No Wound Bed Granulation Amount: Large (67-100%) Exposed Structure Granulation Quality: Pink Fascia Exposed: No Necrotic Amount: Small (1-33%) Fat Layer (Subcutaneous Tissue) Exposed: Yes Necrotic Quality: Adherent Slough Tendon Exposed: No Muscle Exposed: No Joint Exposed: No Bone Exposed: No Periwound Skin Texture Texture Color No Abnormalities Noted: No No Abnormalities Noted: Yes Callus: Yes Temperature / Pain Crepitus: No Temperature: No Abnormality Excoriation: No Tenderness on Palpation: Yes Induration: No Rash: No Scarring: Yes Moisture No Abnormalities Noted: Yes Treatment Notes [name], [name] ([id number]) [id number] Page 7 of 9 Wound #2 (Calcaneus) Wound Laterality: Left Cleanser Normal Saline Discharge Instruction: Cleanse the wound with Normal Saline prior [name] applying a clean dressing using gauze sponges, not tissue or cotton balls. Wound Cleanser Discharge Instruction: Cleanse the wound with wound cleanser prior [name] applying a clean dressing using gauze sponges, not tissue or cotton  balls. Peri-Wound Care Ketoconazole Cream 2% Discharge Instruction: Apply Ketoconazole mixed with zinc  [name] the periwound Zinc  Oxide Ointment 30g tube Discharge Instruction: Apply Zinc  Oxide [name] periwound with each dressing change Topical Gentamicin  Discharge Instruction: apply [name] the wound bed Ketoconazole Cream 2% Discharge  Instruction: Apply Ketoconazole [name] periwound Primary Dressing Maxorb Extra Ag+ Alginate Dressing, 4x4.75 (in/in) Discharge Instruction: Apply [name] wound bed as instructed Secondary Dressing Drawtex 4x4 in Discharge Instruction: Apply over primary dressing as directed. Optifoam Non-Adhesive Dressing, 4x4 in Discharge Instruction: As needed- Apply over primary dressing as directed. Woven Gauze Sponge, Non-Sterile 4x4 in Discharge Instruction: Apply over primary dressing as directed. Secured With 72M Medipore H Soft Cloth Surgical T ape, 4 x 10 (in/yd) Discharge Instruction: Secure with tape as directed. Compression Wrap Kerlix Roll 4.5x3.1 (in/yd) Discharge Instruction: Apply Kerlix and Coban compression as directed. Compression Stockings Add-Ons Electronic Signature(s) Signed: 08/11/2023 12:56:04 PM By: Drury Nestle RN, BSN Signed: 08/16/2023 4:28:52 PM By: Hadassah Belt Entered By: Hadassah Belt on 08/11/2023 09:02:46 -------------------------------------------------------------------------------- Wound Assessment Details Patient Name: Date of Service: [name], [name] [name] 08/11/2023 8:45 A M Medical Record Number: [id number] Patient Account Number: [id number] Date of Birth/Sex: Treating RN: August 01, 1976 (48 y.o. [name] Primary Care Findley Blankenbaker: Stephane Doffing Other Clinician: Referring Cieara Stierwalt: Treating Railee Bonillas/Extender: Marolyn Delon Stephane Doffing Devra in Treatment: 160 Wound Status Wound Number: 5 Primary Etiology: Pressure Ulcer Wound Location: Right, Plantar Foot Wound Status: Open Wounding Event: Gradually Appeared Comorbid History: Asthma,  Angina, Hypertension Date Acquired: 04/27/2023 [facility] Treatment: [address] ([id number]) [phone].pdf Page 8 of 9 Clustered Wound: No Photos Wound Measurements Length: (cm) 1.5 Width: (cm) 0.5 Depth: (cm) 0.6 Area: (cm) 0.589 Volume: (cm) 0.353 % Reduction in Area: -1800% % Reduction in Volume: -11666.7% Epithelialization: Medium (34-66%) Tunneling: No Undermining: No Wound Description Classification: Category/Stage II Wound Margin: Distinct, outline attached Exudate Amount: Medium Exudate Type: Serosanguineous Exudate Color: red, brown Foul Odor After Cleansing: No Slough/Fibrino Yes Wound Bed Granulation Amount: Small (1-33%) Exposed Structure Granulation Quality: Pink, Pale Fascia Exposed: No Necrotic Amount: Small (1-33%) Fat Layer (Subcutaneous Tissue) Exposed: Yes Necrotic Quality: Eschar, Adherent Slough Tendon Exposed: No Muscle Exposed: No Joint Exposed: No Bone Exposed: No Periwound Skin Texture Texture Color No Abnormalities Noted: No No Abnormalities Noted: Yes Callus: Yes Temperature / Pain Rash: No Temperature: No Abnormality Scarring: Yes Tenderness on Palpation: Yes Moisture No Abnormalities Noted: No Dry / Scaly: Yes Treatment Notes Wound #5 (Foot) Wound Laterality: Plantar, Right Cleanser Peri-Wound Care Ketoconazole Cream 2% Discharge Instruction: Apply Ketoconazole [name] periwound as needed Zinc  Oxide Ointment 30g tube Discharge Instruction: Apply Zinc  Oxide [name] periwound with each dressing change Topical Gentamicin  Discharge Instruction: As directed by physician Primary Dressing Maxorb Extra Ag+ Alginate Dressing, 4x4.75 (in/in) Discharge Instruction: Apply [name] wound bed as instructed Secondary Dressing Drawtex 4x4 in Discharge Instruction: As needed -Apply over primary dressing as directed. [name], [name] ([id number]) [phone].pdf Page 9 of 9 Optifoam Non-Adhesive Dressing, 4x4  in Discharge Instruction: Apply over primary dressing as directed. Secured With [employer], 4.5x3.1 (in/yd) Discharge Instruction: Secure with Kerlix as directed. Compression Wrap Compression Stockings Add-Ons Electronic Signature(s) Signed: 08/11/2023 12:56:04 PM By: Drury Nestle RN, BSN Signed: 08/16/2023 4:28:52 PM By: Hadassah Belt Entered By: Hadassah Belt on 08/11/2023 09:03:13 -------------------------------------------------------------------------------- Vitals Details Patient Name: Date of Service: [name], [name] [name] 08/11/2023 8:45 A M Medical Record Number: [id number] Patient Account Number: [id number] Date of Birth/Sex: Treating RN: 10-08-1975 (48 y.o. [name] Drury, Geminus.gell Primary Care Imir Brumbach: Stephane Doffing Other Clinician: Referring Lajuana Patchell: Treating Mykelti Goldenstein/Extender: Marolyn Delon Stephane Doffing Devra in Treatment: 160 Vital Signs Time Taken: 09:04 Temperature (F): 98.2 Height (in): 69 Pulse (bpm): 72 Weight (lbs): 280 Respiratory Rate (breaths/min): 16 Body Mass Index (BMI): 41.3 Blood  Pressure (mmHg): 118/66 Reference Range: 80 - 120 mg / dl Electronic Signature(s) Signed: 08/11/2023 12:56:04 PM By: Drury Nestle RN, BSN Entered By: Drury [name] on 08/11/2023 09:05:09

## 2023-08-22 ENCOUNTER — Encounter (HOSPITAL_COMMUNITY): Payer: Self-pay

## 2023-08-22 ENCOUNTER — Telehealth: Payer: Self-pay

## 2023-08-22 NOTE — Telephone Encounter (Signed)
 Left vm to return call.

## 2023-08-22 NOTE — Telephone Encounter (Signed)
-----   Message from Jennifer SAUNDERS Revankar sent at 08/20/2023  4:25 PM EST ----- The results of the study is unremarkable. Please inform patient. I will discuss in detail at next appointment. Cc  primary care/referring physician Jennifer SAUNDERS Crape, MD 08/20/2023 4:25 PM

## 2023-08-23 ENCOUNTER — Encounter (HOSPITAL_BASED_OUTPATIENT_CLINIC_OR_DEPARTMENT_OTHER): Payer: PPO | Admitting: General Surgery

## 2023-08-23 DIAGNOSIS — L97512 Non-pressure chronic ulcer of other part of right foot with fat layer exposed: Secondary | ICD-10-CM | POA: Diagnosis not present

## 2023-08-23 DIAGNOSIS — L89623 Pressure ulcer of left heel, stage 3: Secondary | ICD-10-CM | POA: Diagnosis not present

## 2023-08-23 DIAGNOSIS — L89892 Pressure ulcer of other site, stage 2: Secondary | ICD-10-CM | POA: Diagnosis not present

## 2023-08-23 IMAGING — CT CT CHEST W/O CM
2 of 3 series · 14 of 36 positions shown, 17 images · non-contrast
Comparison: Chest CT 09/05/2019. Most recent chest radiograph
10/27/2019

CLINICAL DATA: Abnormal xray - lung nodule, >= 1 cm reported
abnormal outpatient CXR, declines re XR, no s/s of PE

EXAM:
CT CHEST WITHOUT CONTRAST
TECHNIQUE: Multidetector CT imaging of the chest was performed following the
standard protocol without IV contrast.

[Series 2: thorax · axial · 0.91mm/px · z∈[+1079,+1331]mm · 11 of 150 slices shown, 14 images]
[im 12/150  mediastinal]
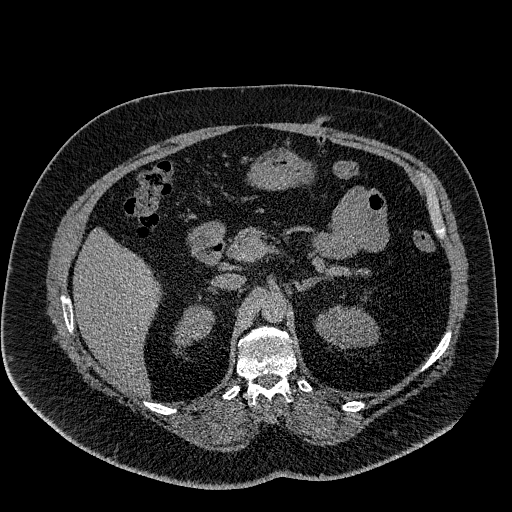
[im 12/150  lung]
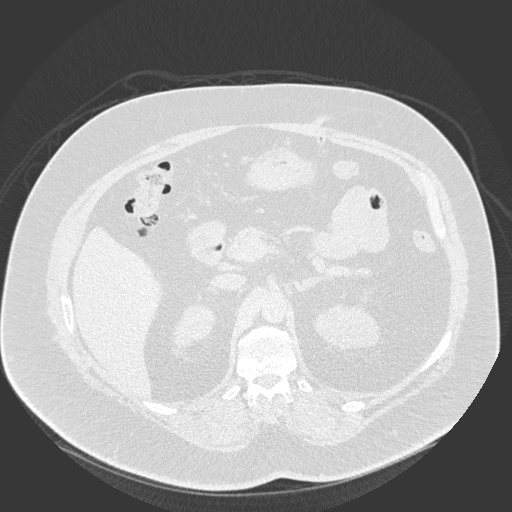
[im 23/150  lung]
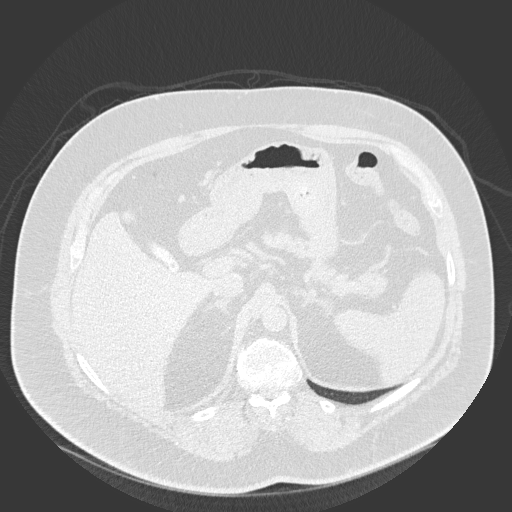
[im 34/150  lung]
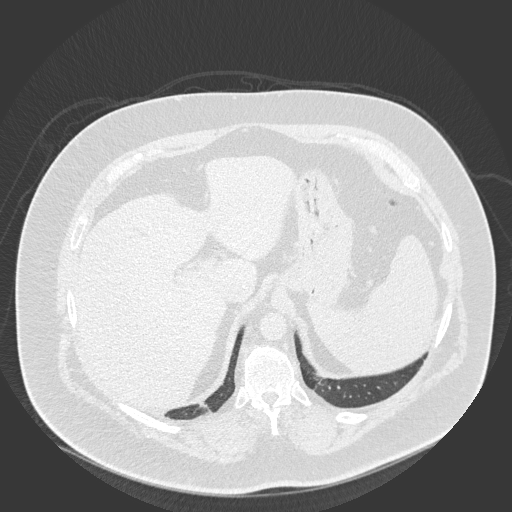
[im 50/150  lung]
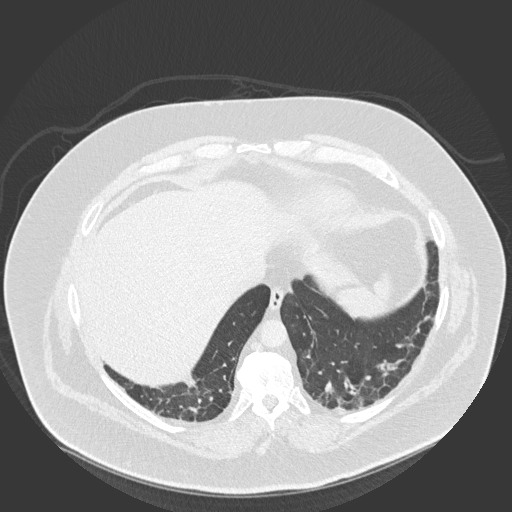
[im 61/150  mediastinal]
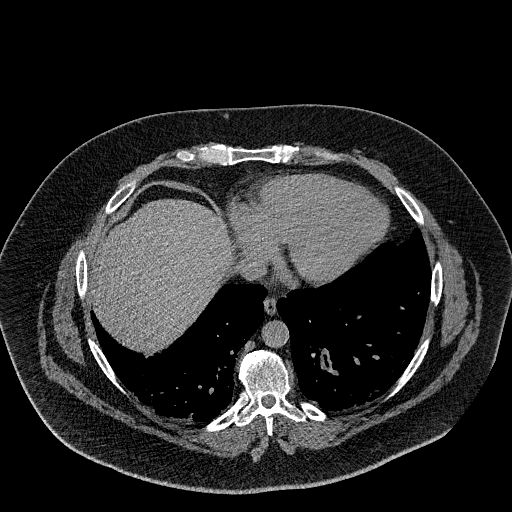
[im 61/150  lung]
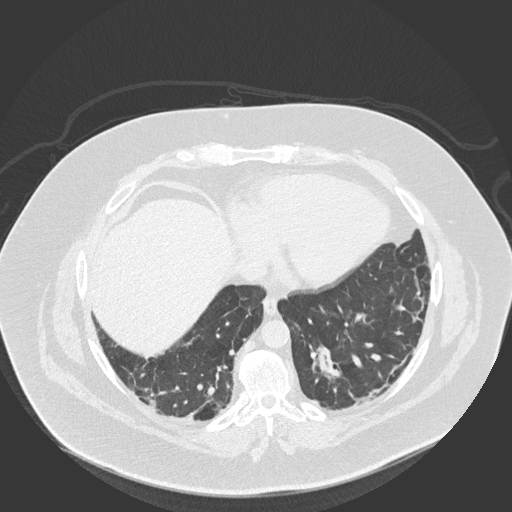
[im 78/150  lung]
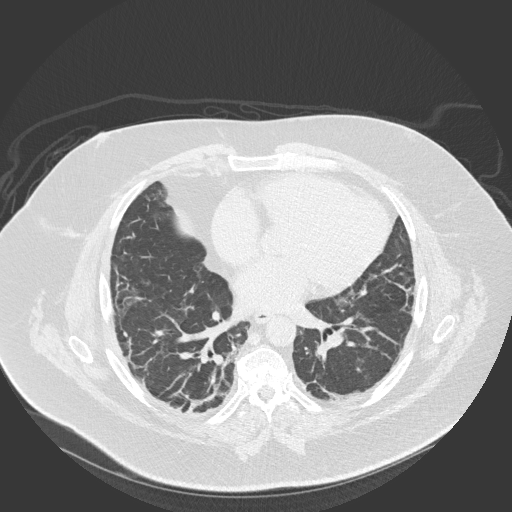
[im 89/150  lung]
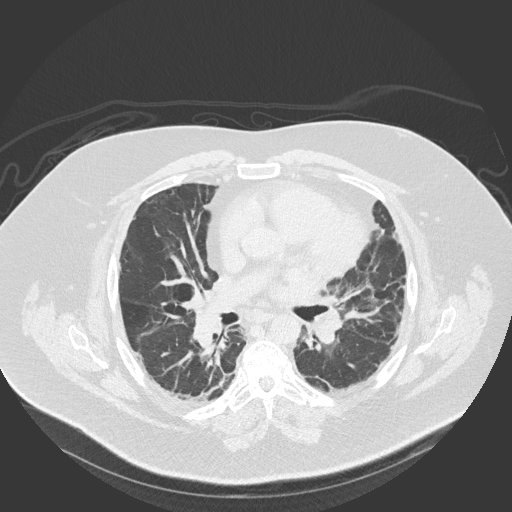
[im 100/150  lung]
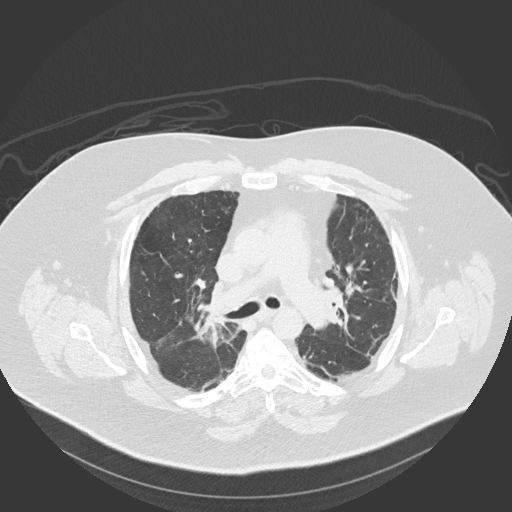
[im 116/150  mediastinal]
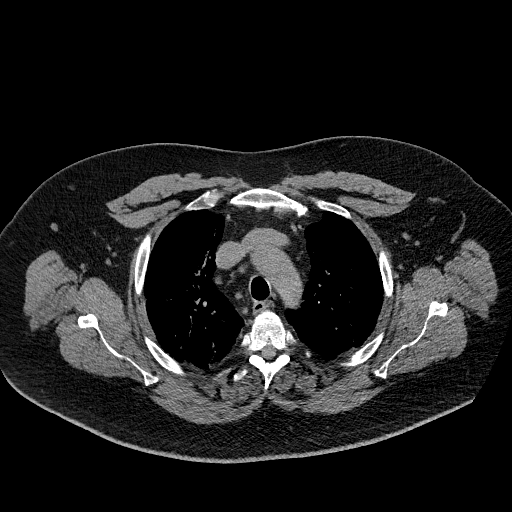
[im 116/150  lung]
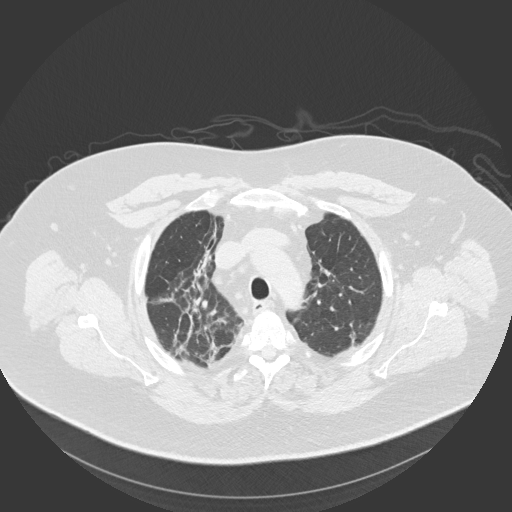
[im 127/150  lung]
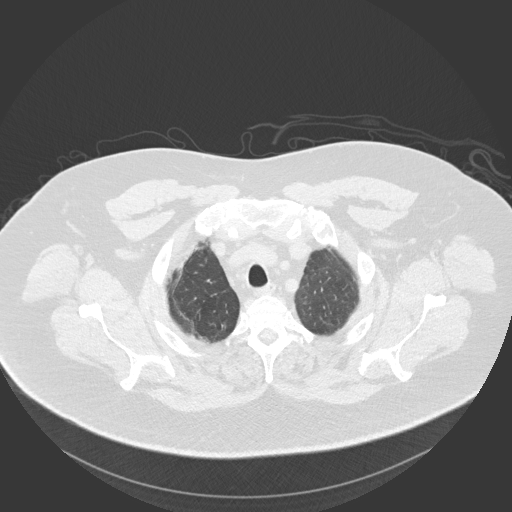
[im 138/150  lung]
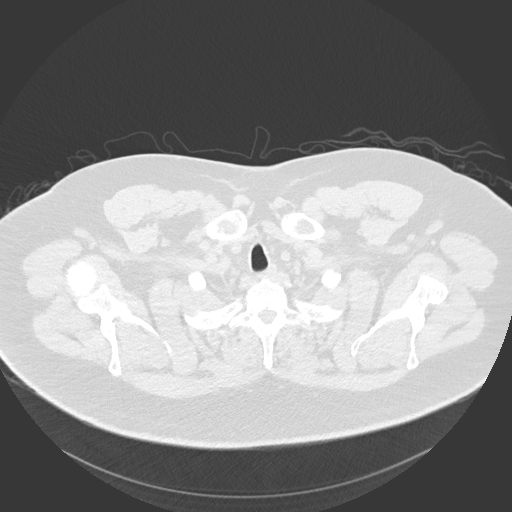

[Series 5: coronal · coronal · 0.64mm/px · 3 of 193 slices shown]
[im 39/193  lung]
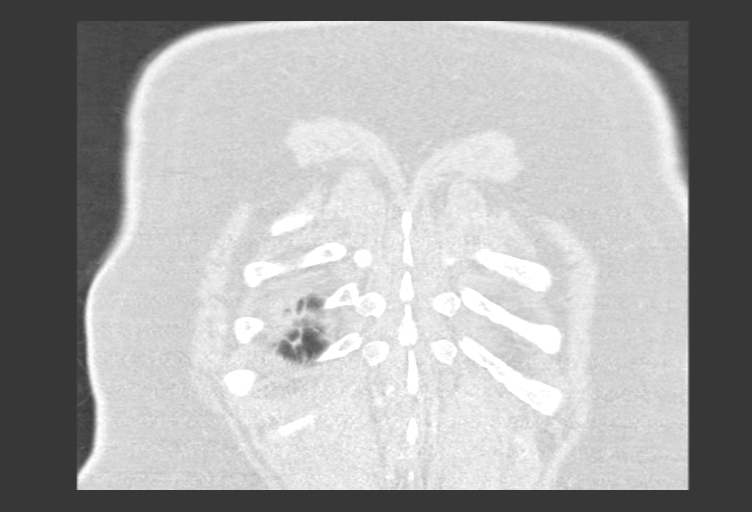
[im 77/193  lung]
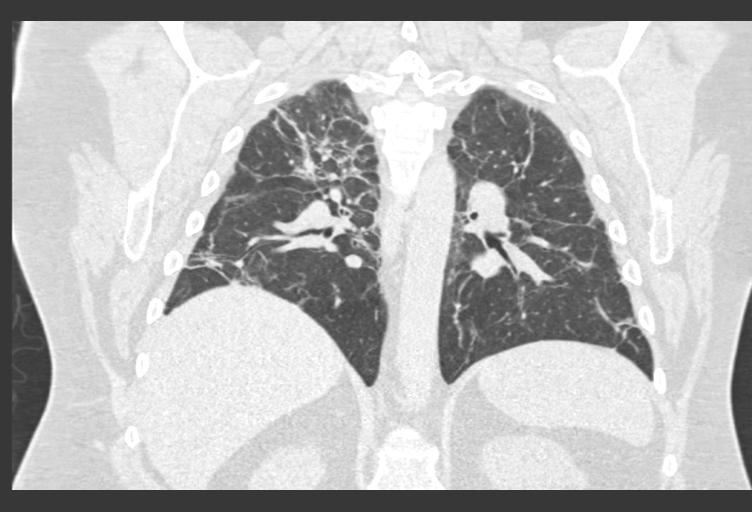
[im 116/193  lung]
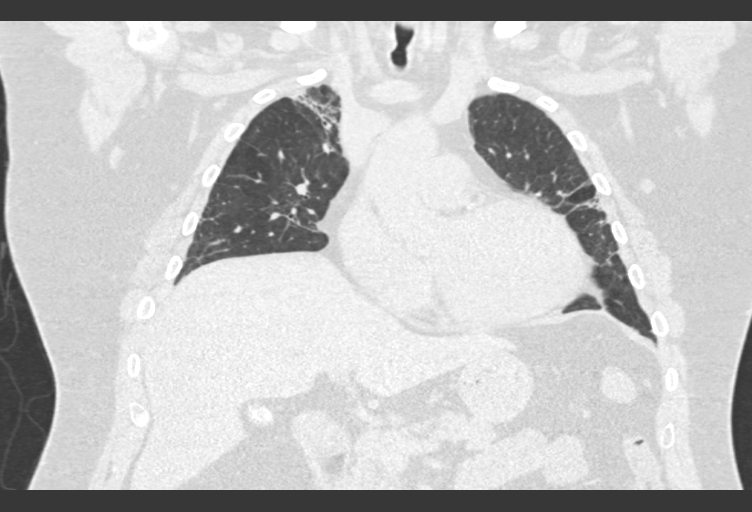

[14 of 36 positions shown; findings below may reference images not displayed]

FINDINGS: Cardiovascular: Normal caliber thoracic aorta. Heart is normal in
size. Small volume pericardial fluid anteriorly, similar to prior.

Mediastinum/Nodes: Shotty mediastinal lymph nodes, with largest
lymph node measuring 14 mm short axis in the right lower
paratracheal station. Overall improved adenopathy from prior exam.
Limited assessment for hilar adenopathy on this unenhanced exam.
Tracheostomy tube is been decannulated. No esophageal wall
thickening.

Lungs/Pleura: Bandlike scarring with bronchiectasis and
architectural distortion in the right greater than left upper lobes.
Additional areas of scarring throughout both lungs, in a subpleural
predominant distribution. Some of these areas of scarring are
slightly confluent. Tiny 4 mm subpleural nodule in the right middle
lobe, series 3, image 64. This was previously obscured by airspace
disease. No larger nodule. No evidence of acute consolidation. No
pleural fluid. Minimal retained mucus in the trachea.

Upper Abdomen: Gallbladder filled with gallstones, nondistended. No
acute upper abdominal findings.

Musculoskeletal: Exaggerated thoracic kyphosis. Mild flattening of
lower thoracic vertebra, chronic and unchanged There are no acute or
suspicious osseous abnormalities.
IMPRESSION: 1. Tiny 4 mm subpleural nodule in the right middle lobe, previously
obscured by airspace disease. No follow-up needed if patient is
low-risk. Non-contrast chest CT can be considered in 12 months if
patient is high-risk. This recommendation follows the consensus
statement: Guidelines for Management of Incidental Pulmonary Nodules
Detected on CT Images: From the [HOSPITAL] 6166; Radiology
6166; [DATE].
2. Bandlike scarring with bronchiectasis and architectural
distortion in the right greater than left upper lobes. Additional
areas of scarring throughout both lungs, in a subpleural predominant
distribution. Findings typical of post COVID sequela.
3. Shotty mediastinal lymph nodes, improved from prior exam, likely
reactive.
4. Tracheostomy tube has been decannulated.
5. Incidental cholelithiasis.

## 2023-08-24 NOTE — Progress Notes (Signed)
AVALON, ZUBIA ([id number]) [id number].pdf Page 1 of 17 Visit Report for 08/23/2023 Chief Complaint Document Details Patient Name: Date of Service: [name], [name] [name] 08/23/2023 9:30 A M Medical Record Number: [id number] Patient Account Number: [id number] Date of Birth/Sex: Treating RN: 22-Mar-1976 (48 y.o. M) Primary Care Provider: Dorinda Hill Other Clinician: Referring Provider: Treating Provider/Extender: Jana Hakim in Treatment: 162 Information Obtained from: Patient Chief Complaint Bilateral Plantar Foot Ulcers Electronic Signature(s) Signed: 08/23/2023 9:40:00 AM By: [name] MD FACS Entered By: [name] on 08/23/2023 09:40:00 -------------------------------------------------------------------------------- Debridement Details Patient Name: Date of Service: [name], [name] [place] E. 08/23/2023 9:30 A M Medical Record Number: [id number] Patient Account Number: [id number] Date of Birth/Sex: Treating RN: 08-Oct-1975 (48 y.o. Cline Cools Primary Care Provider: Dorinda Hill Other Clinician: Referring Provider: Treating Provider/Extender: Jana Hakim in Treatment: 162 Debridement Performed for Assessment: Wound #2 Left Calcaneus Performed By: Physician [name], MD The following information was scribed by: Redmond Pulling The information was scribed for: [name] Debridement Type: Debridement Level of Consciousness (Pre-procedure): Awake and Alert Pre-procedure Verification/Time Out Yes - 09:30 Taken: Start Time: 09:30 Pain Control: Lidocaine 5% topical ointment Percent of Wound Bed Debrided: 100% T Area Debrided (cm): otal 2.83 Tissue and other material debrided: Non-Viable, Eschar, Slough, Subcutaneous, Slough Level: Skin/Subcutaneous Tissue Debridement Description: Excisional Instrument: Curette Bleeding: Minimum Hemostasis Achieved: Pressure Response [name] Treatment: Procedure  was tolerated well Level of Consciousness (Post- Awake and Alert procedure): Post Debridement Measurements of Total Wound Length: (cm) 3.6 Stage: Category/Stage III Width: (cm) 1 Depth: (cm) 0.1 Volume: (cm) 0.283 Character of Wound/Ulcer Post Debridement: Improved Post Procedure Diagnosis [name], [name] ([id number]) [phone].pdf Page 2 of 17 Same as Pre-procedure Electronic Signature(s) Signed: 08/23/2023 10:21:33 AM By: [name] MD FACS Signed: 08/23/2023 4:22:17 PM By: Redmond Pulling RN, BSN Entered By: Redmond Pulling on 08/23/2023 09:32:48 -------------------------------------------------------------------------------- Debridement Details Patient Name: Date of Service: [name], [name] [place] E. 08/23/2023 9:30 A M Medical Record Number: [id number] Patient Account Number: [id number] Date of Birth/Sex: Treating RN: 1975/11/21 (48 y.o. Cline Cools Primary Care Provider: Dorinda Hill Other Clinician: Referring Provider: Treating Provider/Extender: Jana Hakim in Treatment: 162 Debridement Performed for Assessment: Wound #5 Right,Plantar Foot Performed By: Physician [name], MD Debridement Type: Debridement Level of Consciousness (Pre-procedure): Awake and Alert Pre-procedure Verification/Time Out Yes - 09:30 Taken: Start Time: 09:30 Pain Control: Lidocaine 5% topical ointment Percent of Wound Bed Debrided: 100% T Area Debrided (cm): otal 0.35 Tissue and other material debrided: Non-Viable, Eschar, Slough, Subcutaneous, Slough Level: Skin/Subcutaneous Tissue Debridement Description: Excisional Instrument: Curette Bleeding: Minimum Hemostasis Achieved: Pressure Response [name] Treatment: Procedure was tolerated well Level of Consciousness (Post- Awake and Alert procedure): Post Debridement Measurements of Total Wound Length: (cm) 1.5 Stage: Category/Stage II Width: (cm) 0.3 Depth: (cm) 0.6 Volume: (cm)  0.212 Character of Wound/Ulcer Post Debridement: Improved Post Procedure Diagnosis Same as Pre-procedure Electronic Signature(s) Signed: 08/23/2023 10:21:33 AM By: [name] MD FACS Signed: 08/23/2023 4:22:17 PM By: Redmond Pulling RN, BSN Entered By: Redmond Pulling on 08/23/2023 09:35:47 -------------------------------------------------------------------------------- HPI Details Patient Name: Date of Service: [name], [name] [place] E. 08/23/2023 9:30 A M Medical Record Number: [id number] Patient Account Number: [id number] Date of Birth/Sex: Treating RN: [birth year] (48 y.o. M) Primary Care Provider: Dorinda Hill Other Clinician: Referring Provider: Treating Provider/Extender: Jana Hakim in Treatment: [address], [place] E ([id number]) [id number] Page 3 of 17 History of Present Illness HPI Description: Wounds  are12/03/2020 upon evaluation today patient presents for initial inspection here in our clinic concerning issues he has been having with the bottoms of his feet bilaterally. He states these actually occurred as wounds when he was hospitalized for 5 months secondary [name] Covid. He was apparently with tilting bed where he was in an upright position quite frequently and apparently this occurred in some way shape or form during that time. Fortunately there is no sign of active infection at this time. No fevers, chills, nausea, vomiting, or diarrhea. With that being said he still has substantial wounds on the plantar aspects of his feet Theragen require quite a bit of work [name] get these [name] heal. He has been using Santyl currently though that is been problematic both in receiving the medication as well as actually paid for it as it is become quite expensive. Prior [name] the experience with Covid the patient really did not have any major medical problems other than hypertension he does have some mild generalized weakness following the Covid  experience. 07/22/2020 on evaluation today patient appears [name] be doing okay in regard [name] his foot ulcers I feel like the wound beds are showing signs of better improvement that I do believe the Iodoflex is helping in this regard. With that being said he does have a lot of drainage currently and this is somewhat blue/green in nature which is consistent with Pseudomonas. I do think a culture today would be appropriate for [place] [name] evaluate and see if that is indeed the case I would likely start him on antibiotic orally as well he is not allergic [name] Cipro knows of no issues he has had in the past 12/21; patient was admitted [name] the clinic earlier this month with bilateral presumed pressure ulcers on the bottom of his feet apparently related [name] excessive pressure from a tilt table arrangement in the intensive care unit. Patient relates this [name] being on ECMO but I am not really sure that is exactly related [name] that. I must say I have never seen anything like this. He has fairly extensive full-thickness wounds extending from his heel towards his midfoot mostly centered laterally. There is already been some healing distally. He does not appear [name] have an arterial issue. He has been using gentamicin [name] the wound surfaces with Iodoflex [name] help with ongoing debridement 1/6; this is a patient with pressure ulcers on the bottom of his feet related [name] excessive pressure from a standing position in the intensive care unit. He is complaining of a lot of pain in the right heel. He is not a diabetic. 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 We have been using Iodoflex [name] help prepare the surfaces of both wounds for an advanced treatment product. He is nonambulatory spending most of his time in a wheelchair I have asked him not [name] propel the wheelchair with his heels 1/13; in general his wounds look better not much surface area change we have been using Iodoflex as of last week. I did an x-ray of  the right heel as the patient was complaining of pain. I had some thoughts about a stress fracture perhaps Achilles tendon problems however what it showed was erosive changes along the inferior aspect of the calcaneus he now has a MRI booked for 1/20. 1/20; in general his wounds continue [name] be better. Some improvement in the large narrow areas proximally in his foot. He is still complaining of pain in the right heel and tenderness in certain  areas of this wound. His MRI is tonight. I am not just looking for osteomyelitis that was brought up on the x-ray I am wondering about stress fractures, tendon ruptures etc. He has no such findings on the left. Also noteworthy is that the patient had critical illness neuropathy and some of the discomfort may be actual improvement in nerve function I am just not sure. These wounds were initially in the setting of severe critical illness related [name] COVID-19. He was put in a standing position. He may have also been on pressors at the point contributing [name] tissue ischemia. By his description at some point these wounds were grossly necrotic extending proximally up into the Achilles part of his heel. I do not know that I have ever really seen pictures of them like this although they may exist in epic We have ordered Tri layer Oasis. I am trying [name] stimulate some granulation in these areas. This is of course assuming the MRI is negative for infection 1/27; since the patient was last here he saw Dr. Earlene Plater of infectious disease. He is planned for vancomycin and ceftriaxone. Prior operative culture grew MSSA. Also ordered baseline lab work. He also ordered arterial studies although the ABIs in our clinic were normal as well as his clinical exam these were normal I do not think he needs [name] see vascular surgery. His ABIs at the [facility] were 1.22 in the right triphasic waveforms with a normal TBI of 1.15 on the left ABI of 1.22 with triphasic waveforms and a normal TBI of  1.08. Finally he saw Dr. Logan Bores who will follow him in for 2 months. At this point I do not think he felt that he needed a procedure on the right calcaneal bone. Dr. Earlene Plater is elected for broad-spectrum antibiotic The patient is still having pain in the right heel. He walks with a walker 2/3; wounds are generally smaller. He is tolerating his IV antibiotics. I believe this is vancomycin and ceftriaxone. We are still waiting for Oasis burn in terms of his out-of-pocket max which he should be meeting soon given the IV antibiotics, MRIs etc. I have asked him [name] check in on this. We are using silver collagen in the meantime the wounds look better 2/10; tolerating IV vancomycin and Rocephin. We are waiting [name] apply for Oasis. Although I am not really sure where he is in his out-of-pocket max. 2/17 started the first application of Oasis trilayer. Still on antibiotics. The wounds have generally look better. The area on the left has a little more surface slough requiring debridement 2/24; second application of Oasis trilayer. The wound surface granulation is generally look better. The area on the left with undermining laterally I think is come in a bit. 10/08/2020 upon evaluation today patient is here today for [employer] application #3. Fortunately he seems [name] be doing extremely well with regard [name] this and we are seeing a lot of new epithelial growth which is great news. Fortunately there is no signs of active infection at this time. 10/16/2020 upon evaluation today patient appears [name] be doing well with regard [name] his foot ulcers. Do believe the Oasis has been of benefit for him. I do not see any signs of infection right now which is great news and I think that he has a lot of new epithelial growth which is great [name] see as well. The patient is very pleased [name] hear all of this. I do think we can proceed with the Oasis trilayer #4  today. 3/18; not as much improvement in these areas on his heels that I was  hoping. I did reapply trilateral Oasis today the tissue looks healthier but not as much fill in as I was hoping. 3/25; better looking today I think this is come in a bit the tissue looks healthier. Triple layer Oasis reapplied #6 4/1; somewhat better looking definitely better looking surface not as much change in surface area as I was hoping. He may be spending more time [place] on days then he needs [name] although he does have heel offloading boots. Triple layer Oasis reapplied #7 4/7; unfortunately apparently [facility] will not approve any further Oasis which is unfortunate since the patient did respond nicely both in terms of the condition of the wound bed as well as surface area. There is still some drainage coming from the wound but not a lot there does not appear [name] be any infection 4/15; we have been using Hydrofera Blue. He continues [name] have nice rims of epithelialization on the right greater than the left. The left the epithelialization is coming from the tip of his heel. There is moderate drainage. In this that concerns me about a total contact cast. There is no evidence of infection 4/29; patient has been using Hydrofera Blue with dressing changes. He has no complaints or issues today. 5/5; using Hydrofera Blue. I actually think that he looks marginally better than the last time I saw this 3 weeks ago. There are rims of epithelialization on the left thumb coming from the medial side on the right. Using Hydrofera Blue 5/12; using Hydrofera Blue. These continue [name] make improvements in surface area. His drainage was not listed as severe I therefore went ahead and put a cast on the left foot. Right foot we will continue [name] dress his previous 5/16; back for first total contact cast change. He did not tolerate this particularly well cast injury on the anterior tibia among other issues. Difficulty sleeping. I talked him about this in some detail and afterwards is elected [name] continue. I  told him I would like [name] have a cast on for 3 weeks [name] see if this is going [name] help at all. I think he agreed 5/19; I think the wound is better. There is no tunneling towards his midfoot. The undermining medially also looks better. He has a rim of new skin distally. I think we are making progress here. The area on the left also continues [name] look somewhat better [name] me using Hydrofera Blue. He has a list of complaints about the cast but none of them seem serious 5/26; patient presents for 1 week follow-up. He has been using a total contact cast and tolerating this well. Hydrofera Blue is the main dressing used. He denies signs of infection. [name], [name] ([id number]) [phone].pdf Page 4 of 17 6/2 Hydrofera Blue total contact cast on the left. These were large ulcers that formed in intensive care unit where the patient was recovering from COVID. May have had something to do with being ventilated in an upright positiono Pressors etc. We have been able [name] get the areas down considerably and a viable surface. There is some epithelialization in both sides. Note made of drainage 6/9; changed [name] [facility] last time because of drainage. He arrives with better looking surfaces and dimensions on the left than the right. Paradoxically the right actually probes more towards his midfoot the left is largely close down but both of these look improved. Using  a total contact cast on the left 6/16; complex wounds on his bilateral plantar heels which were initially pressure injury from a stay in the ICU with COVID. We have been using silver alginate most recently. His dimensions of come in quite dramatically however not recently. We have been putting the left foot in a total contact cast 6/23; complex wounds on the bilateral plantar heels. I been putting the left in the cast paradoxically the area on the right is the one that is going towards closure at a faster rate. Quite a bit of  drainage on the left. The patient went [name] see Dr. Logan Bores who said he was going [name] standby for skin grafts. I had actually considered sending him for skin grafts however he would be mandatorily off his feet for a period of weeks [name] months. I am thinking that the area on the right is going [name] close on its own the area on the left has been more stubborn even though we have him in a total contact cast 6/30; took him out of a total contact cast last week is the right heel seem [name] be making better progress than the left where I was placing the cast. We are using silver alginate. Both wounds are smaller right greater than left 7/12; both wounds look as though they are making some progress. We are using silver alginate. Heel offloading boots 7/26; very gradual progress especially on the right. Using silver alginate. He is wearing heel offloading boots 8/18; he continues [name] close these wounds down very gradually. Using silver alginate. The problem polymen being definitive about this is areas of what appears [name] be callus around the margins. This is not a 100% of the area but certainly sizable especially on the right 9/1; bilateral plantar feet wounds secondary [name] prolonged pressure while being ventilated for COVID-19 in an upright position. Essentially pressure ulcers on the bottom of his feet. He is made substantial progress using silver alginate. 9/14; bilateral plantar feet wounds secondary [name] prolonged pressure. Making progress using silver alginate. 9/29 bilateral plantar feet wounds secondary [name] prolonged pressure. I changed him [name] Iodoflex last week. MolecuLight showing reddened blush fluorescence 10/11; patient presents for follow-up. He has no issues or complaints today. He denies signs of infection. He continues [name] use Iodoflex and antibiotic ointment [name] the wound beds. 10/27; 2-week follow-up. No evidence of infection. He has callus and thick dry skin around the wound margins we have been using  Iodoflex and Bactroban which was in response [name] a moderate left MolecuLight reddish blush fluorescence. 11/10; 2-week follow-up. Wound margins again have thick callus however the measurements of the actual wound sites are a lot smaller. Everything looks reasonably healthy here. We have been using Iodoflex He was approved for prime matrix but I have elected [name] delay this given the improvement in the surface area. Hopefully I will not regret that decision as were getting close [name] the end of the year in terms of insurance payment 12/8; 2-week follow-up. Wounds are generally smaller in size. These were initially substantial wounds extending into the forefoot all the way into the heel on the bilateral plantar feet. They are now both located on the plantar heel distal aspect both of these have a lot of callus around the wounds I used a #5 curette [name] remove this on the right and the left also some subcutaneous debris [name] try and get the wound edges were using Iodoflex. He has heel offloading shoe 12/22; 2-week follow-up. Not  really much improvement. He has thick callus around the outer edges of both wounds. I remove this there is some nonviable subcutaneous tissue as well. We have been using Iodoflex. Her intake nurse and myself spontaneously thought of a total contact cast I went back in May. At that time we really were not seeing much of an improvement with a cast although the wound was in a much different situation I would like [name] retry this in 2 weeks and I discussed this with the patient 08/12/2021; the patient has had some improvement with the Iodoflex. The the area on the left heel plantar more improved than the right. I had [name] put him in a total contact cast on the left although I decided [name] put that off for 2 weeks. I am going [name] change his primary dressing [name] silver collagen. I think in both areas he has had some improvement most of the healing seems [name] be more proximal in the heel. The wounds are in  the mid aspect. A lot of thick callus on the right heel however. 1/19; we are using silver collagen on both plantar heel areas. He has had some improvement today. The left did not require any debridement. He still had some eschar on the right that was debrided but both seem [name] have contracted. I did not put it total contact cast on him today 2/2 we have been using silver collagen. The area on the right plantar heel has areas that appear [name] be epithelialized interspersed with dry flaking callus and dry skin. I removed this. This really looks better than on the other side. On the left still a large area with raised edges and debris on the surface. 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 He has no issues or complaints today. 2/9; not much change in the left foot wound with 1 week of a cast we are using silver collagen. Silver collagen on the right side. The right side has been the better wound surface. We will reapply the total contact cast on the left 2/16; not much improvement on either side I been using silver collagen with a total contact cast on the left. I'm changing the Hydrofera Blue still with a total contact cast on the left 2/23; some improvement on both sides. Disappointing that he has thick callus around the area that we are putting in a total contact cast on the left. We've been using Hydrofera Blue on both wound areas. This is a man who at essentially pressure ulcers in addition [name] ischemia caused by medications [name] support his blood pressure (pressors) in the ICU. He was being ventilated in the standing position for severe Covid. A Shiley the wounds extended across his entire foot but are now localized [name] his plantar heels bilaterally. We have made progress however neither areas healed. I continue [name] think the total contact cast is helped albeit  painstakingly slowly. He has never wanted a plastic surgery consult although I don't know that they would be interested in grafting in area in this location. 10/07/2021: Continued improvement bilaterally. There is still some callus around the left wound, despite the total contact cast. He has some increased pain in his right midfoot around 1 particular area. This has been painful in the past but seems [name] be a little bit worse. When his cast was removed today, there was an  area on the heel of the left foot that looks a bit macerated. He is also complaining of pain in his left thigh and hip which he thinks is secondary [name] the limb length discrepancy caused by the cast. 10/14/2021: He continues [name] improve. A little bit less callus around the left wound. He continues [name] endorse pain in his right midfoot, but this is not as significant as it was last week. The maceration on his left heel is improved. 10/21/2021: Continued improvement [name] both wounds. The maceration on his left heel is no longer evident. Less callus bilaterally. Epithelialization progressing. 10/28/2021: Significant improvement this week. The right sided wound is nearly closed with just a small open area at the middle. No maceration seen on the left heel. Continued epithelialization on both sides. No concern for infection. 11/04/2021: T oday, the wounds were measured a little bit differently and come out as larger, but I actually think they are about the same [name] potentially even smaller, particularly on the left. He continues [name] accumulate some callus on the right. 11/11/2021: T oday, the patient is expressing some concern that the left wound, despite being in the total contact cast, is not progressing at the same rate as the 1 on the right. He is interested in trying a week without the cast [name] see how the wound does. The wounds are roughly the same size as last week, with the right perhaps being a little bit smaller. He continues [name] build up callus  on both sites. 11/18/2021: Last week, I permitted the patient [name] go without his total contact cast, just [name] see if the cast was really making any difference. Today, both wounds have deteriorated [name] some extent, suggesting that the cast is providing benefit, at least on the left. Both are larger and have accumulated callus, slough, and other debris. 11/26/2021: I debrided both wounds quite aggressively last week in an effort [name] stimulate the healing cascade. This appears [name] have been effective as the left KARO, LEINONEN ([id number]) [phone].pdf Page 5 of 17 sided wound is a full centimeter shorter in length. Although the right was measured slightly larger, on inspection, it looks as though an area of epithelialized tissue was included in the measurements. We have been using PolyMem Ag on the wound surfaces with a total contact cast [name] the left. 12/02/2021: It appears that the intake personnel are including epithelialized tissue in his wound measurements; the right wound is almost completely epithelialized; there is just a crater at the proximal midfoot with some open areas. On the left, he has built up some callus, but the overall wound surface looks good. There is some senescent skin around the wound margin. He has been in PolyMem Ag bilaterally with a total contact cast on the left. 12/09/2021: The right wound is nearly closed; there is just a small open area at the mid calcaneus. On the left, the wound is smaller with minimal callus buildup. No significant drainage. 12/16/2021: The right calcaneal wound remains minimally open at the mid calcaneus; the rest has epithelialized. On the left, the wound is also a little bit smaller. There is some senescent tissue on the periphery. He is getting his first application of a trial skin substitute called Vendaje today. 12/23/2021: The wound on his right calcaneus is nearly closed; there is just a small area at the most distal aspect of  the calcaneus that is open. On the left, the area where we applied [name] the skin substitute has a healthier-looking bed  of granulation tissue. The wound dimensions are not significantly different on this side but the wound surface is improved. 12/30/2021: The wound on the right calcaneus has not changed significantly aside from some accumulation of callus. On the left, the open area is smaller and continues [name] have an improved surface. He continues [name] accumulate callus around the wound. He is here for his third application of Vendaje. 01/06/2022: The right calcaneal wound is down [name] just a couple of millimeters. He continues [name] accumulate periwound callus. He unfortunately got his cast wet earlier in the week and his left foot is macerated, resulting in some superficial skin loss just distal [name] the open wound. The open wound itself, however, is much smaller and has a healthier appearing surface. He is here for his fourth application of Vendaje. 01/13/2022: The right calcaneal wound is about the same. Unfortunately, once again, his cast got wet and his foot is again macerated. This is caused the left calcaneal wound [name] enlarge. He is here for his fifth application of Vendaje. 01/20/2022: The right calcaneal wound is very small. There is some periwound callus accumulation. He purchased a new cast protector last week and this has been effective in avoiding the maceration that has been occurring on the left. The left calcaneal wound is narrower and has a healthy and viable-appearing surface. He is here for his 6 application of Vendaje. 01/27/2022: The right calcaneal wound is down [name] just a pinhole. There is some periwound slough and callus. On the left, the wound is narrower and shorter by about a centimeter. The surface is robust and viable-appearing. Unfortunately, the rep for the trial skin substitute product did not provide any for [place] [name] use today. 02/04/2022: The right calcaneal wound remains unchanged.  There is more accumulated callus. On the left, although the intake nurse measured it a little bit longer, it looks about the same [name] me. The surface has a layer of slough, but underneath this, there is good granulation tissue. 02/10/2022: The right calcaneus wound is nearly closed. There is still some callus that builds up around the site. The left side looks about the same in terms of dimensions, but the surface is more robust and vital-appearing. 02/16/2022: The area of the right calcaneus that was nearly closed last week has closed, but there is a small opening at the mid foot where it looks like some moisture got retained and caused some reopening. The left foot wound is narrower and shallower. Both sites have a fair amount of periwound callus and eschar. 02/24/2022: The small midfoot opening on the right calcaneus is a little bit smaller today. The left foot wound is narrower and shallower. He continues [name] accumulate periwound callus. No concern for infection. 03/01/2022: The patient came [name] clinic early because he showered and got his cast wet. Fortunately, there is no significant maceration [name] his foot but the callus softened and it looks like the wound on his left calcaneus may be a little bit wider. The wound on his right calcaneus is just a narrow slit. Continued accumulation of periwound callus bilaterally. 03/08/2022: The wound on his right calcaneus is very nearly closed, just a small pinpoint opening under a bit of eschar; the left wound has come in quite a bit, as well. It is narrower and shorter than at our last visit. Still with accumulated callus and eschar bilaterally. 03/17/2022: The right calcaneal wound is healed. The left wound is smaller and the surface itself is very clean, but there is  some blue-green staining on the periwound callus, concerning for Pseudomonas aeruginosa. 03/23/2022: The right calcaneal wound remains closed. The left wound continues [name] contract. No further blue-green  staining. Small amount of callus and slough accumulation. 03/28/2022: He came in early today because he had gotten his cast wet. On inspection, the wound itself did not get wet or macerated, just a little bit of the forefoot. The wound itself is basically unchanged. 04/07/2022: The right foot wound remains closed. The left wound is the smallest that I have seen it [name] date. It is narrower and shorter. It still continues [name] accumulate slough on the surface. 04/15/2022: There is a band of epithelium now dividing the small left plantar foot wound in 2. There is still some slough on the surface. 04/21/2022: The wound continues [name] narrow. Just a little bit of slough on the surface. He seems [name] be responding well [name] endoform. 04/28/2022: Continued slow contraction of the wound. There is a little slough on the surface and some periwound callus. We have been using endoform and total contact cast. 05/05/2022: The wound appears [name] have stalled. There is slough and some periwound eschar/callus. No concern for infection, however. 05/12/2022: Unfortunately, his right foot has reopened. It is located at the most posterior aspect of his surgical incision. The area was noted [name] have drainage coming from it when his padding was removed today. Underneath some callus and senescent skin, there is an opening. No purulent drainage or malodor. On the left foot, the wound is again unchanged. There is some light blue staining on the callus, but no malodor or purulent drainage. 10/13; right and left heel remanence of extensive plantar foot wounds. These are better than I remember by quite a big margin however he is still left with wounds on the left plantar heel and the right plantar heel. Been using endoform bilaterally. A culture was done that showed apparently Pseudomonas but we are still waiting for the [facility] antibiotic [name] use gentamicin today. He is still very active by description I am not sure about the offloading of his  noncasted right foot 10/20; both wounds right and left heel debrided not much change from last week. [name] Echevaria has arrived which is linezolid, gentamicin and ciprofloxacin we will use this with endoform. T contact cast on the left otal 06/02/2022: Both wounds are smaller today. There is still a fair amount of callus buildup around the right foot ulcer. The left is more superficial and nearly flush with the surrounding tissues. Also with slough and eschar buildup. 06/10/2022: The right sided wound appears [name] be nearly closed, if not completely so, although it is somewhat difficult [name] tell given the abnormal tissue and scarring in his foot. There is a fair amount of callus and crust accumulation. On the left, the wound looks about the same, again with callus and slough. He has an appointment next Thursday with Dr. Annamary Rummage in podiatry; I am hopeful that there may be some reconstructive options available for Mr. Howle. [name], [name] ([id number]) [phone].pdf Page 6 of 17 06/16/2022: Both wounds have some eschar and callus accumulation. The right sided wound is extremely narrow and barely open; the left is narrower than last week. There is a little bit of slough. He has his appointment in podiatry later today. 06/23/2022: The patient met with Dr. Annamary Rummage last week and unfortunately, there are no reconstructive options that he believes would be helpful. He did order an MRI [name] evaluate for osteomyelitis and fortunately, none was seen. The  left sided wound is a little bit shorter and narrower today. The right sided wound is about the same. There is callus and eschar accumulation bilaterally. 06/29/2022: Both feet have improved from last week. There is epithelium making a valiant effort [name] creep across the surface on the left. The right side looks like it got a little dry and the deep crevasse in his midfoot has cracked. Both have eschar and there is some slough on the  left. 07/07/2022: Both feet have improved. There is epithelium completely covering the calcaneus on the right with just a small opening in the crevasse in his midfoot. On the left, the open area of tissue is smaller but he continues [name] build up callus/eschar and slough. 07/15/2022: The opening in the midfoot on the right is about the same size, covered with eschar and a little bit of slough. The open portion of the left wound is narrower and shorter with a bit of slough buildup. He admits [name] being on his feet more than recommended. 12/14; as far as I can tell everything on the right foot is closed. There is some eschar I removed some of this I cannot identify any open wound here. As usual this will be a very vulnerable area going forward. On the left this looks really quite healthy. I was pleasantly surprised [name] see how good this looked. Wound is certainly smaller and there appears [name] be healthy epithelialization. He has been using Promogran on the right and endoform on the left. He has been offloading the right foot with a heel offloading boot and he has a running shoe on the right foot 08/04/2022: The right foot remains closed. He has a thick cushioned insole in his sneaker. The left sided wound is smaller with just some slough and eschar accumulation. He is wearing the heel off loader on this foot. 08/15/2022: The right foot remains closed. The left wound has narrowed further. There is some slough and eschar accumulation. 08/25/2022: We put him in a peg assist shoe insert and as a result, he has more epithelialization of the ulcer with minimal slough and eschar accumulation. 09/01/2022: The wound is smaller by about half this week. Still with some slough on the surface. The peg assist shoe seems [name] be doing a remarkable job of adequately offloading the site. 09/08/2022: There is a little bit more epithelium coming in. There is some slough and callus buildup. 09/16/2022: The wound measures about the same  size, but the epithelium that has grown in looks more robust and stronger. There is some slough and callus buildup. 09/23/2022: The wound remains about the same size. The skin edges are looking rather senescent. 09/29/2022: The aggressive debridement I performed last week seems [name] have been effective. The wound is smaller and has significantly less slough accumulation. 10/06/2022: There has been quite a bit of epithelialization since last week. There are still some open areas with slough accumulation. There is some callus buildup around the perimeter. 10/13/2022: Continued improvement. Minimal callus accumulation. 3/14; patient presents for follow-up. He has been using endoform [name] the wound bed without issues. He is using a surgical shoe with peg assist for offloading. 10/27/2022: The wound dimensions are stable. There is some senescent skin accumulation around the perimeter. 11/04/2022: Last week I performed a very aggressive debridement in an effort [name] stimulate the healing cascade. As has been the case when I have done this before, the wound has responded well. There has been epithelialization and contraction of the wound. There  are just a couple of small open areas. 11/10/2022: Unfortunately, his foot got wet secondary [name] sweating and there has been some breakdown of the tissue, particularly the skin just adjacent [name] the main ulcer. 11/18/2022: We continue [name] struggle with moisture-related tissue breakdown around the perimeter of his wound. This has caused the thin epithelium that had formed on the surface [name] disintegrate. The underlying surface of the wound has good granulation tissue. 11/25/2022: The edges of the wound are much less macerated, but the surface is actually looking a little bit dry. There is slough accumulation. No obvious signs of infection. 12/01/2022: The wound edges are more macerated and broken down, making the wound larger. The surface has some slough on it. 12/08/2020: The wound looks  better this week. There is significantly less maceration and moisture-related tissue breakdown. There is some slough on the wound surface. 12/15/2022: The wound continues [name] improve. There is almost no moisture-related tissue breakdown. There is epithelium beginning [name] fill in again from the edges. Light slough on the wound surface. 12/22/2022: More epithelium has filled him from around the edges. There is some slough on the surface. No evidence of moisture-related tissue breakdown. 01/05/2023: There has been more epithelialization, particularly at the posterior calcaneal aspect of the wound. There has been a little bit of moisture accumulation with minimal maceration. 01/12/2023: More epithelium has filled in. There is very minimal accumulation of slough. 01/20/2023: The wound is measuring a little bit smaller today. It did measure deeper, but this appears [name] be secondary [name] some heaped up senescent skin around the edges. The epithelium that has been present at the posterior aspect of the wound seems [name] be a bit more robust with greater integrity. 01/26/2023: He reports intense itching [name] the skin around the wound and there has been some breakdown here. It looks fungal in nature. The wound itself looks pretty good with improved epithelialization with just some slough buildup. He has a little callus along the wound edges. 02/02/2023: He has been treating the periwound skin with an over-the-counter athlete's foot medication and notes significant improvement in his pruritus. The skin looks better here, as well. The wound continues [name] epithelialize. There is light slough accumulation. 02/10/2023: The periwound skin still looks fairly angry. He reports multiple small blisters that have opened up. He is not having the intense itching that he had prior, however. There has been more epithelialization at the calcaneus and there is minimal slough accumulation. [name], [name] ([id number])  [phone].pdf Page 7 of 17 02/16/2023: The periwound skin is improving. An online review of dermatology rash images suggest that this may be hand-foot-and-mouth disease, isolated [name] the feet; the patient says he actually had this as a child. The calcaneus continues [name] epithelialize and the tissue was quite robust here. Very minimal slough accumulation on the open portion of the wound with a little bit of periwound callus buildup. 03/03/2023: The wound measurements are about the same, but there is more epithelium making its way [name] the surface. There is a little bit of crust and eschar around the edges and minimal slough on the surface. 03/09/2023: His dressing was applied rather haphazardly and the drawtex was not applied. The Optifoam was also reversed such that the orange side was in contact with his bare skin. He has some maceration around the wound edges, but this did not appear [name] result in any tissue breakdown. There is a little bit of slough on the surface and minimal eschar around the edges. 03/15/2023: The  wound measured narrower today. There is some slough accumulation on the surface and some eschar around the edges. 03/23/2023: The length of the wound remains stable, but it continues [name] narrow. The skin that has covered the posterior aspect of the calcaneus is more robust and has better tensile integrity. Minimal slough and eschar accumulation. 03/30/2023: The wound length has decreased. This seems [name] be primarily due [name] more epithelium covering the posterior aspect of the site. Minimal slough and eschar buildup. 04/07/2023: The wound measured a little bit smaller again today. There is minimal slough accumulation with just a little bit of eschar and callus around the edges. 04/13/2023: The lateral aspect of his wound is starting [name] epithelialize. There is very minimal slough on the surface. Moisture control is good. 04/20/2023: Continued, albeit extremely slow, encroachment of  epithelium as of the wound surface. Minimal slough. Moisture balance is good. 04/27/2023: More epithelium is filling in from the heel area. There is a little bit of slough on the surface with some periwound callus and eschar. He did note a little bit of drainage coming from his right foot and there is a small crack in the skin extending medially from the main site of his prior ulceration. 05/04/2023: The wounds are stable this week. No significant change. There is a little bit of callus buildup around the edges of each site and some thin slough on the surface on the left foot. 05/11/2023: The skin on the right heel is filling in further. There is a little bit of slough accumulation on both surfaces along with periwound callus accumulation. 05/18/2023: Both wounds are smaller today. The right foot is down [name] just a sliver of opening. 05/25/2023: Epithelium continues [name] advance from the calcaneal area towards the midfoot on the left. On the right, there is tiniest crack of an opening that remains. 06/01/2023: No significant change bilaterally. He has built up quite a bit of callus on the calcaneal surface of the right foot. 06/15/2023: He missed his appointment last week. The wounds measured larger today. There is some slough and eschar accumulation on both wounds. He was discussed in the [facility] wound camp last week. The consensus was in favor of proceeding with plastic surgery intervention but they also suggested repeating the MRI imaging of his heels [name] ensure no osteomyelitis present. 06/22/2023: The wounds are basically stable. His MRI is scheduled for tomorrow and his surgery for a week from tomorrow, 22 November. 12/9; patient I know from some months ago. 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 He saw Dr. [name] aylor from plastic surgery who is planning surgery. He is received his okay from pulmonology and he sees cardiology on Wednesday. He has been using silver  alginate [name] both wound areas. The area on the left heel actually looks fairly good healthy looking granulation some epithelialization towards the tip of the heel. Right heel is almost completely eschared. 07/28/2023: No significant change [name] the wounds. The MRIs have finally been read and there is no osteomyelitis seen in either heel. His surgery had [name] be rescheduled due [name] concerns from anesthesiology about his post-COVID cardiopulmonary status. He has been cleared by pulmonology, but cardiology wants to do some additional testing in advance of clearing him for surgery. I understand he is also planning [name] go ahead with bariatric surgery. 08/11/2023: The right wound is deeper with a lot of built-up crust, eschar, and slough. The left wound is smaller with more epithelialization. His echocardiogram is normal  and his coronary calcium score is 0 however the coronary CTA was nondiagnostic due [name] poor opacification of vessels. He has previously been cleared by pulmonology [name] proceed with surgery. 08/23/2023: No significant change [name] either foot. He is awaiting a Lexiscan [name] get cardiac clearance for surgery. Electronic Signature(s) Signed: 08/23/2023 9:41:03 AM By: [name] MD FACS Entered By: [name] on 08/23/2023 09:41:03 -------------------------------------------------------------------------------- Physical Exam Details Patient Name: Date of Service: [name], [name] [place] E. 08/23/2023 9:30 A M Medical Record Number: [id number] Patient Account Number: [id number] Date of Birth/Sex: Treating RN: 1976-06-03 (48 y.o. M) Primary Care Provider: Dorinda Hill Other Clinician: Referring Provider: Treating Provider/Extender: Jana Hakim in Treatment: 162 Constitutional . . . . no acute distress. [name], [name] ([id number]) [phone].pdf Page 8 of 17 Respiratory Normal work of breathing on room air.. Notes 08/23/2023: No significant change [name]  either foot. Electronic Signature(s) Signed: 08/23/2023 9:41:50 AM By: [name] MD FACS Entered By: [name] on 08/23/2023 09:41:50 -------------------------------------------------------------------------------- Physician Orders Details Patient Name: Date of Service: [name], [name] [place] E. 08/23/2023 9:30 A M Medical Record Number: [id number] Patient Account Number: [id number] Date of Birth/Sex: Treating RN: 1975-10-24 (48 y.o. Cline Cools Primary Care Provider: Dorinda Hill Other Clinician: Referring Provider: Treating Provider/Extender: Jana Hakim in Treatment: [phone] Verbal / Phone Orders: No Diagnosis Coding Follow-up Appointments ppointment in 2 weeks. - Dr. Lady Gary Return A Anesthetic Wound #2 Left Calcaneus (In clinic) Topical Lidocaine 4% applied [name] wound bed - In clinic Bathing/ Shower/ Hygiene May shower and wash wound with soap and water. Off-Loading Open toe surgical shoe [name]: - with peg assist insert both feet Other: - keep pressure off of the bottom of your feet. Elevate legs throughout the day. Use the Shoe with the PegAssist off-loading insole both feet Additional Orders / Instructions Follow Nutritious Diet - Try [name] get 70-100 grams of Protein a day+ Coupons for Juven sent with patient. Other: - Vitamin C 500 mg per day [name] start, then increase [name] 500mg  twice a day. Also consider adding a zinc supplement. Juven Shake 1-2 times daily. Wound Treatment Wound #2 - Calcaneus Wound Laterality: Left Cleanser: Normal Saline (Generic) 1 x Per Day/Other:until healed Discharge Instructions: Cleanse the wound with Normal Saline prior [name] applying a clean dressing using gauze sponges, not tissue or cotton balls. Cleanser: Wound Cleanser (Generic) 1 x Per Day/Other:until healed Discharge Instructions: Cleanse the wound with wound cleanser prior [name] applying a clean dressing using gauze sponges, not tissue or cotton balls. Peri-Wound Care:  Ketoconazole Cream 2% (Generic) 1 x Per Day/Other:until healed Discharge Instructions: Apply Ketoconazole mixed with zinc [name] the periwound Peri-Wound Care: Zinc Oxide Ointment 30g tube (Generic) 1 x Per Day/Other:until healed Discharge Instructions: Apply Zinc Oxide [name] periwound with each dressing change Topical: Gentamicin (Generic) 1 x Per Day/Other:until healed Discharge Instructions: apply [name] the wound bed Topical: Ketoconazole Cream 2% 1 x Per Day/Other:until healed Discharge Instructions: Apply Ketoconazole [name] periwound Prim Dressing: Maxorb Extra Ag+ Alginate Dressing, 4x4.75 (in/in) (Generic) 1 x Per Day/Other:until healed ary Discharge Instructions: Apply [name] wound bed as instructed Secondary Dressing: Drawtex 4x4 in (Generic) 1 x Per Day/Other:until healed ARJUNREDDY, BALTHASER ([id number]) [phone].pdf Page 9 of 17 Discharge Instructions: Apply over primary dressing as directed. Secondary Dressing: Optifoam Non-Adhesive Dressing, 4x4 in (Generic) 1 x Per Day/Other:until healed Discharge Instructions: As needed- Apply over primary dressing as directed. Secondary Dressing: Woven Gauze Sponge, Non-Sterile 4x4 in (Generic) 1 x Per Day/Other:until  healed Discharge Instructions: Apply over primary dressing as directed. Secured With: 49M Medipore H Soft Cloth Surgical T ape, 4 x 10 (in/yd) (Generic) 1 x Per Day/Other:until healed Discharge Instructions: Secure with tape as directed. Compression Wrap: Kerlix Roll 4.5x3.1 (in/yd) (Generic) 1 x Per Day/Other:until healed Discharge Instructions: Apply Kerlix and Coban compression as directed. Wound #5 - Foot Wound Laterality: Plantar, Right Peri-Wound Care: Ketoconazole Cream 2% 1 x Per Day/30 Days Discharge Instructions: Apply Ketoconazole [name] periwound as needed Peri-Wound Care: Zinc Oxide Ointment 30g tube 1 x Per Day/30 Days Discharge Instructions: Apply Zinc Oxide [name] periwound with each dressing change Topical:  Gentamicin 1 x Per Day/30 Days Discharge Instructions: As directed by physician Prim Dressing: Maxorb Extra Ag+ Alginate Dressing, 4x4.75 (in/in) 1 x Per Day/30 Days ary Discharge Instructions: Apply [name] wound bed as instructed Secondary Dressing: Drawtex 4x4 in 1 x Per Day/30 Days Discharge Instructions: As needed -Apply over primary dressing as directed. Secondary Dressing: Optifoam Non-Adhesive Dressing, 4x4 in 1 x Per Day/30 Days Discharge Instructions: Apply over primary dressing as directed. Secured With: [employer], 4.5x3.1 (in/yd) 1 x Per Day/30 Days Discharge Instructions: Secure with Kerlix as directed. Patient Medications llergies: No Known Drug Allergies A Notifications Medication Indication Start End 08/23/2023 lidocaine DOSE topical 5 % ointment - ointment topical once daily Electronic Signature(s) Signed: 08/23/2023 10:21:33 AM By: [name] MD FACS Entered By: [name] on 08/23/2023 09:42:11 -------------------------------------------------------------------------------- Problem List Details Patient Name: Date of Service: [name], [name] [place] E. 08/23/2023 9:30 A M Medical Record Number: [id number] Patient Account Number: [id number] Date of Birth/Sex: Treating RN: 14-Aug-1975 (48 y.o. M) Primary Care Provider: Dorinda Hill Other Clinician: Referring Provider: Treating Provider/Extender: Jana Hakim in Treatment: [phone] Active Problems ICD-10 Encounter Code Description Active Date MDM Diagnosis L97.512 Non-pressure chronic ulcer of other part of right foot with fat layer exposed 04/27/2023 No Yes TERRIL, JUNKINS ([id number]) [phone].pdf Page 10 of [id number] Non-pressure chronic ulcer of other part of left foot with fat layer exposed 09/03/2020 No Yes Inactive Problems ICD-10 Code Description Active Date Inactive Date L89.893 Pressure ulcer of other site, stage 3 07/15/2020 07/15/2020 M62.81 Muscle  weakness (generalized) 07/15/2020 07/15/2020 I10 Essential (primary) hypertension 07/15/2020 07/15/2020 M86.171 Other acute osteomyelitis, right ankle and foot 09/03/2020 09/03/2020 Resolved Problems Electronic Signature(s) Signed: 08/23/2023 9:38:55 AM By: [name] MD FACS Entered By: [name] on 08/23/2023 09:38:55 -------------------------------------------------------------------------------- Progress Note Details Patient Name: Date of Service: [name], [name] [place] E. 08/23/2023 9:30 A M Medical Record Number: [id number] Patient Account Number: [id number] Date of Birth/Sex: Treating RN: Oct 15, 1975 (48 y.o. M) Primary Care Provider: Dorinda Hill Other Clinician: Referring Provider: Treating Provider/Extender: Jana Hakim in Treatment: 162 Subjective Chief Complaint Information obtained from Patient Bilateral Plantar Foot Ulcers History of Present Illness (HPI) Wounds are12/03/2020 upon evaluation today patient presents for initial inspection here in our clinic concerning issues he has been having with the bottoms of his feet bilaterally. He states these actually occurred as wounds when he was hospitalized for 5 months secondary [name] Covid. He was apparently with tilting bed where he was in an upright position quite frequently and apparently this occurred in some way shape or form during that time. Fortunately there is no sign of active infection at this time. No fevers, chills, nausea, vomiting, or diarrhea. With that being said he still has substantial wounds on the plantar aspects of his feet Theragen require quite a bit of work [name] get these  [name] heal. He has been using Santyl currently though that is been problematic both in receiving the medication as well as actually paid for it as it is become quite expensive. Prior [name] the experience with Covid the patient really did not have any major medical problems other than hypertension he does have some mild generalized  weakness following the Covid experience. 07/22/2020 on evaluation today patient appears [name] be doing okay in regard [name] his foot ulcers I feel like the wound beds are showing signs of better improvement that I do believe the Iodoflex is helping in this regard. With that being said he does have a lot of drainage currently and this is somewhat blue/green in nature which is consistent with Pseudomonas. I do think a culture today would be appropriate for [place] [name] evaluate and see if that is indeed the case I would likely start him on antibiotic orally as well he is not allergic [name] Cipro knows of no issues he has had in the past 12/21; patient was admitted [name] the clinic earlier this month with bilateral presumed pressure ulcers on the bottom of his feet apparently related [name] excessive pressure from a tilt table arrangement in the intensive care unit. Patient relates this [name] being on ECMO but I am not really sure that is exactly related [name] that. I must say I have never seen anything like this. He has fairly extensive full-thickness wounds extending from his heel towards his midfoot mostly centered laterally. There is already been some healing distally. He does not appear [name] have an arterial issue. He has been using gentamicin [name] the wound surfaces with Iodoflex [name] help with ongoing debridement 1/6; this is a patient with pressure ulcers on the bottom of his feet related [name] excessive pressure from a standing position in the intensive care unit. He is complaining of a lot of pain in the right heel. He is not a diabetic. 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 We have been using Iodoflex [name] help prepare the surfaces of both wounds for an advanced treatment product. He is nonambulatory spending most of his time in a wheelchair I have asked him not [name] propel the wheelchair with his heels 1/13; in general his wounds look better not much surface area change we have been using Iodoflex as of  last week. [name], [name] ([id number]) [phone].pdf Page 11 of 17 I did an x-ray of the right heel as the patient was complaining of pain. I had some thoughts about a stress fracture perhaps Achilles tendon problems however what it showed was erosive changes along the inferior aspect of the calcaneus he now has a MRI booked for 1/20. 1/20; in general his wounds continue [name] be better. Some improvement in the large narrow areas proximally in his foot. He is still complaining of pain in the right heel and tenderness in certain areas of this wound. His MRI is tonight. I am not just looking for osteomyelitis that was brought up on the x-ray I am wondering about stress fractures, tendon ruptures etc. He has no such findings on the left. Also noteworthy is that the patient had critical illness neuropathy and some of the discomfort may be actual improvement in nerve function I am just not sure. These wounds were initially in the setting of severe critical illness related [name] COVID-19. He was put in a standing position. He may have also been on pressors at the point contributing [name] tissue ischemia. By his description at some  point these wounds were grossly necrotic extending proximally up into the Achilles part of his heel. I do not know that I have ever really seen pictures of them like this although they may exist in epic We have ordered Tri layer Oasis. I am trying [name] stimulate some granulation in these areas. This is of course assuming the MRI is negative for infection 1/27; since the patient was last here he saw Dr. Earlene Plater of infectious disease. He is planned for vancomycin and ceftriaxone. Prior operative culture grew MSSA. Also ordered baseline lab work. He also ordered arterial studies although the ABIs in our clinic were normal as well as his clinical exam these were normal I do not think he needs [name] see vascular surgery. His ABIs at the [facility] were 1.22 in the right triphasic  waveforms with a normal TBI of 1.15 on the left ABI of 1.22 with triphasic waveforms and a normal TBI of 1.08. Finally he saw Dr. Logan Bores who will follow him in for 2 months. At this point I do not think he felt that he needed a procedure on the right calcaneal bone. Dr. Earlene Plater is elected for broad-spectrum antibiotic The patient is still having pain in the right heel. He walks with a walker 2/3; wounds are generally smaller. He is tolerating his IV antibiotics. I believe this is vancomycin and ceftriaxone. We are still waiting for Oasis burn in terms of his out-of-pocket max which he should be meeting soon given the IV antibiotics, MRIs etc. I have asked him [name] check in on this. We are using silver collagen in the meantime the wounds look better 2/10; tolerating IV vancomycin and Rocephin. We are waiting [name] apply for Oasis. Although I am not really sure where he is in his out-of-pocket max. 2/17 started the first application of Oasis trilayer. Still on antibiotics. The wounds have generally look better. The area on the left has a little more surface slough requiring debridement 2/24; second application of Oasis trilayer. The wound surface granulation is generally look better. The area on the left with undermining laterally I think is come in a bit. 10/08/2020 upon evaluation today patient is here today for [employer] application #3. Fortunately he seems [name] be doing extremely well with regard [name] this and we are seeing a lot of new epithelial growth which is great news. Fortunately there is no signs of active infection at this time. 10/16/2020 upon evaluation today patient appears [name] be doing well with regard [name] his foot ulcers. Do believe the Oasis has been of benefit for him. I do not see any signs of infection right now which is great news and I think that he has a lot of new epithelial growth which is great [name] see as well. The patient is very pleased [name] hear all of this. I do think we can proceed  with the Oasis trilayer #4 today. 3/18; not as much improvement in these areas on his heels that I was hoping. I did reapply trilateral Oasis today the tissue looks healthier but not as much fill in as I was hoping. 3/25; better looking today I think this is come in a bit the tissue looks healthier. Triple layer Oasis reapplied #6 4/1; somewhat better looking definitely better looking surface not as much change in surface area as I was hoping. He may be spending more time [place] on days then he needs [name] although he does have heel offloading boots. Triple layer Oasis reapplied #7 4/7; unfortunately apparently [employer] will not approve any further Oasis which is unfortunate since the patient did respond nicely both in terms of the condition of the wound bed as well as surface area. There is still some drainage coming from the wound but not a lot there does not appear [name] be any infection 4/15; we have been using Hydrofera Blue. He continues [name] have nice rims of epithelialization on the right greater than the left. The left the epithelialization is coming from the tip of his heel. There is moderate drainage. In this that concerns me about a total contact cast. There is no evidence of infection 4/29; patient has been using Hydrofera Blue with dressing changes. He has no complaints or issues today. 5/5; using Hydrofera Blue. I actually think that he looks marginally better than the last time I saw this 3 weeks ago. There are rims of epithelialization on the left thumb coming from the medial side on the right. Using Hydrofera Blue 5/12; using Hydrofera Blue. These continue [name] make improvements in surface area. His drainage was not listed as severe I therefore went ahead and put a cast on the left foot. Right foot we will continue [name] dress his previous 5/16; back for first total contact cast change. He did not tolerate this particularly well cast injury on the anterior tibia among other issues.  Difficulty sleeping. I talked him about this in some detail and afterwards is elected [name] continue. I told him I would like [name] have a cast on for 3 weeks [name] see if this is going [name] help at all. I think he agreed 5/19; I think the wound is better. There is no tunneling towards his midfoot. The undermining medially also looks better. He has a rim of new skin distally. I think we are making progress here. The area on the left also continues [name] look somewhat better [name] me using Hydrofera Blue. He has a list of complaints about the cast but none of them seem serious 5/26; patient presents for 1 week follow-up. He has been using a total contact cast and tolerating this well. Hydrofera Blue is the main dressing used. He denies signs of infection. 6/2 Hydrofera Blue total contact cast on the left. These were large ulcers that formed in intensive care unit where the patient was recovering from COVID. May have had something to do with being ventilated in an upright positiono Pressors etc. We have been able [name] get the areas down considerably and a viable surface. There is some epithelialization in both sides. Note made of drainage 6/9; changed [name] [facility] last time because of drainage. He arrives with better looking surfaces and dimensions on the left than the right. Paradoxically the right actually probes more towards his midfoot the left is largely close down but both of these look improved. Using a total contact cast on the left 6/16; complex wounds on his bilateral plantar heels which were initially pressure injury from a stay in the ICU with COVID. We have been using silver alginate most recently. His dimensions of come in quite dramatically however not recently. We have been putting the left foot in a total contact cast 6/23; complex wounds on the bilateral plantar heels. I been putting the left in the cast paradoxically the area on the right is the one that is going towards closure at a faster rate.  Quite a bit of drainage on the left. The patient went [name] see Dr. Logan Bores who said he was going [name] standby  for skin grafts. I had actually considered sending him for skin grafts however he would be mandatorily off his feet for a period of weeks [name] months. I am thinking that the area on the right is going [name] close on its own the area on the left has been more stubborn even though we have him in a total contact cast 6/30; took him out of a total contact cast last week is the right heel seem [name] be making better progress than the left where I was placing the cast. We are using silver alginate. Both wounds are smaller right greater than left 7/12; both wounds look as though they are making some progress. We are using silver alginate. Heel offloading boots 7/26; very gradual progress especially on the right. Using silver alginate. He is wearing heel offloading boots 8/18; he continues [name] close these wounds down very gradually. Using silver alginate. The problem polymen being definitive about this is areas of what appears [name] be callus around the margins. This is not a 100% of the area but certainly sizable especially on the right 9/1; bilateral plantar feet wounds secondary [name] prolonged pressure while being ventilated for COVID-19 in an upright position. Essentially pressure ulcers on the bottom of his feet. He is made substantial progress using silver alginate. 9/14; bilateral plantar feet wounds secondary [name] prolonged pressure. Making progress using silver alginate. 9/29 bilateral plantar feet wounds secondary [name] prolonged pressure. I changed him [name] Iodoflex last week. MolecuLight showing reddened blush fluorescence 10/11; patient presents for follow-up. He has no issues or complaints today. He denies signs of infection. He continues [name] use Iodoflex and antibiotic ointment [name] the wound beds. 10/27; 2-week follow-up. No evidence of infection. He has callus and thick dry skin around the wound margins we have  been using Iodoflex and Bactroban which was in response [name] a moderate left MolecuLight reddish blush fluorescence. 11/10; 2-week follow-up. Wound margins again have thick callus however the measurements of the actual wound sites are a lot smaller. Everything looks [name], [name] ([id number]) [phone].pdf Page 12 of 17 reasonably healthy here. We have been using Iodoflex He was approved for prime matrix but I have elected [name] delay this given the improvement in the surface area. Hopefully I will not regret that decision as were getting close [name] the end of the year in terms of insurance payment 12/8; 2-week follow-up. Wounds are generally smaller in size. These were initially substantial wounds extending into the forefoot all the way into the heel on the bilateral plantar feet. They are now both located on the plantar heel distal aspect both of these have a lot of callus around the wounds I used a #5 curette [name] remove this on the right and the left also some subcutaneous debris [name] try and get the wound edges were using Iodoflex. He has heel offloading shoe 12/22; 2-week follow-up. Not really much improvement. He has thick callus around the outer edges of both wounds. I remove this there is some nonviable subcutaneous tissue as well. We have been using Iodoflex. Her intake nurse and myself spontaneously thought of a total contact cast I went back in May. At that time we really were not seeing much of an improvement with a cast although the wound was in a much different situation I would like [name] retry this in 2 weeks and I discussed this with the patient 08/12/2021; the patient has had some improvement with the Iodoflex. The the area on the left heel plantar more  improved than the right. I had [name] put him in a total contact cast on the left although I decided [name] put that off for 2 weeks. I am going [name] change his primary dressing [name] silver collagen. I think in both areas he has had  some improvement most of the healing seems [name] be more proximal in the heel. The wounds are in the mid aspect. A lot of thick callus on the right heel however. 1/19; we are using silver collagen on both plantar heel areas. He has had some improvement today. The left did not require any debridement. He still had some eschar on the right that was debrided but both seem [name] have contracted. I did not put it total contact cast on him today 2/2 we have been using silver collagen. The area on the right plantar heel has areas that appear [name] be epithelialized interspersed with dry flaking callus and dry skin. I removed this. This really looks better than on the other side. On the left still a large area with raised edges and debris on the surface. 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 He has no issues or complaints today. 2/9; not much change in the left foot wound with 1 week of a cast we are using silver collagen. Silver collagen on the right side. The right side has been the better wound surface. We will reapply the total contact cast on the left 2/16; not much improvement on either side I been using silver collagen with a total contact cast on the left. I'm changing the Hydrofera Blue still with a total contact cast on the left 2/23; some improvement on both sides. Disappointing that he has thick callus around the area that we are putting in a total contact cast on the left. We've been using Hydrofera Blue on both wound areas. This is a man who at essentially pressure ulcers in addition [name] ischemia caused by medications [name] support his blood pressure (pressors) in the ICU. He was being ventilated in the standing position for severe Covid. A Shiley the wounds extended across his entire foot but are now localized [name] his plantar heels bilaterally. We have made progress  however neither areas healed. I continue [name] think the total contact cast is helped albeit painstakingly slowly. He has never wanted a plastic surgery consult although I don't know that they would be interested in grafting in area in this location. 10/07/2021: Continued improvement bilaterally. There is still some callus around the left wound, despite the total contact cast. He has some increased pain in his right midfoot around 1 particular area. This has been painful in the past but seems [name] be a little bit worse. When his cast was removed today, there was an area on the heel of the left foot that looks a bit macerated. He is also complaining of pain in his left thigh and hip which he thinks is secondary [name] the limb length discrepancy caused by the cast. 10/14/2021: He continues [name] improve. A little bit less callus around the left wound. He continues [name] endorse pain in his right midfoot, but this is not as significant as it was last week. The maceration on his left heel is improved. 10/21/2021: Continued improvement [name] both wounds. The maceration on his left heel is no longer evident. Less callus bilaterally. Epithelialization progressing. 10/28/2021: Significant improvement this  week. The right sided wound is nearly closed with just a small open area at the middle. No maceration seen on the left heel. Continued epithelialization on both sides. No concern for infection. 11/04/2021: T oday, the wounds were measured a little bit differently and come out as larger, but I actually think they are about the same [name] potentially even smaller, particularly on the left. He continues [name] accumulate some callus on the right. 11/11/2021: T oday, the patient is expressing some concern that the left wound, despite being in the total contact cast, is not progressing at the same rate as the 1 on the right. He is interested in trying a week without the cast [name] see how the wound does. The wounds are roughly the same size as last  week, with the right perhaps being a little bit smaller. He continues [name] build up callus on both sites. 11/18/2021: Last week, I permitted the patient [name] go without his total contact cast, just [name] see if the cast was really making any difference. Today, both wounds have deteriorated [name] some extent, suggesting that the cast is providing benefit, at least on the left. Both are larger and have accumulated callus, slough, and other debris. 11/26/2021: I debrided both wounds quite aggressively last week in an effort [name] stimulate the healing cascade. This appears [name] have been effective as the left sided wound is a full centimeter shorter in length. Although the right was measured slightly larger, on inspection, it looks as though an area of epithelialized tissue was included in the measurements. We have been using PolyMem Ag on the wound surfaces with a total contact cast [name] the left. 12/02/2021: It appears that the intake personnel are including epithelialized tissue in his wound measurements; the right wound is almost completely epithelialized; there is just a crater at the proximal midfoot with some open areas. On the left, he has built up some callus, but the overall wound surface looks good. There is some senescent skin around the wound margin. He has been in PolyMem Ag bilaterally with a total contact cast on the left. 12/09/2021: The right wound is nearly closed; there is just a small open area at the mid calcaneus. On the left, the wound is smaller with minimal callus buildup. No significant drainage. 12/16/2021: The right calcaneal wound remains minimally open at the mid calcaneus; the rest has epithelialized. On the left, the wound is also a little bit smaller. There is some senescent tissue on the periphery. He is getting his first application of a trial skin substitute called Vendaje today. 12/23/2021: The wound on his right calcaneus is nearly closed; there is just a small area at the most distal  aspect of the calcaneus that is open. On the left, the area where we applied [name] the skin substitute has a healthier-looking bed of granulation tissue. The wound dimensions are not significantly different on this side but the wound surface is improved. 12/30/2021: The wound on the right calcaneus has not changed significantly aside from some accumulation of callus. On the left, the open area is smaller and continues [name] have an improved surface. He continues [name] accumulate callus around the wound. He is here for his third application of Vendaje. 01/06/2022: The right calcaneal wound is down [name] just a couple of millimeters. He continues [name] accumulate periwound callus. He unfortunately got his cast wet earlier in the week and his left foot is macerated, resulting in some superficial skin loss just distal [name] the open wound.  The open wound itself, however, is much smaller and has a healthier appearing surface. He is here for his fourth application of Vendaje. 01/13/2022: The right calcaneal wound is about the same. Unfortunately, once again, his cast got wet and his foot is again macerated. This is caused the left calcaneal wound [name] enlarge. He is here for his fifth application of Vendaje. 01/20/2022: The right calcaneal wound is very small. There is some periwound callus accumulation. He purchased a new cast protector last week and this has been effective in avoiding the maceration that has been occurring on the left. The left calcaneal wound is narrower and has a healthy and viable-appearing [name], [name] ([id number]) [phone].pdf Page 13 of 17 surface. He is here for his 6 application of Vendaje. 01/27/2022: The right calcaneal wound is down [name] just a pinhole. There is some periwound slough and callus. On the left, the wound is narrower and shorter by about a centimeter. The surface is robust and viable-appearing. Unfortunately, the rep for the trial skin substitute product did not  provide any for [place] [name] use today. 02/04/2022: The right calcaneal wound remains unchanged. There is more accumulated callus. On the left, although the intake nurse measured it a little bit longer, it looks about the same [name] me. The surface has a layer of slough, but underneath this, there is good granulation tissue. 02/10/2022: The right calcaneus wound is nearly closed. There is still some callus that builds up around the site. The left side looks about the same in terms of dimensions, but the surface is more robust and vital-appearing. 02/16/2022: The area of the right calcaneus that was nearly closed last week has closed, but there is a small opening at the mid foot where it looks like some moisture got retained and caused some reopening. The left foot wound is narrower and shallower. Both sites have a fair amount of periwound callus and eschar. 02/24/2022: The small midfoot opening on the right calcaneus is a little bit smaller today. The left foot wound is narrower and shallower. He continues [name] accumulate periwound callus. No concern for infection. 03/01/2022: The patient came [name] clinic early because he showered and got his cast wet. Fortunately, there is no significant maceration [name] his foot but the callus softened and it looks like the wound on his left calcaneus may be a little bit wider. The wound on his right calcaneus is just a narrow slit. Continued accumulation of periwound callus bilaterally. 03/08/2022: The wound on his right calcaneus is very nearly closed, just a small pinpoint opening under a bit of eschar; the left wound has come in quite a bit, as well. It is narrower and shorter than at our last visit. Still with accumulated callus and eschar bilaterally. 03/17/2022: The right calcaneal wound is healed. The left wound is smaller and the surface itself is very clean, but there is some blue-green staining on the periwound callus, concerning for Pseudomonas aeruginosa. 03/23/2022: The right  calcaneal wound remains closed. The left wound continues [name] contract. No further blue-green staining. Small amount of callus and slough accumulation. 03/28/2022: He came in early today because he had gotten his cast wet. On inspection, the wound itself did not get wet or macerated, just a little bit of the forefoot. The wound itself is basically unchanged. 04/07/2022: The right foot wound remains closed. The left wound is the smallest that I have seen it [name] date. It is narrower and shorter. It still continues [name] accumulate slough on the  surface. 04/15/2022: There is a band of epithelium now dividing the small left plantar foot wound in 2. There is still some slough on the surface. 04/21/2022: The wound continues [name] narrow. Just a little bit of slough on the surface. He seems [name] be responding well [name] endoform. 04/28/2022: Continued slow contraction of the wound. There is a little slough on the surface and some periwound callus. We have been using endoform and total contact cast. 05/05/2022: The wound appears [name] have stalled. There is slough and some periwound eschar/callus. No concern for infection, however. 05/12/2022: Unfortunately, his right foot has reopened. It is located at the most posterior aspect of his surgical incision. The area was noted [name] have drainage coming from it when his padding was removed today. Underneath some callus and senescent skin, there is an opening. No purulent drainage or malodor. On the left foot, the wound is again unchanged. There is some light blue staining on the callus, but no malodor or purulent drainage. 10/13; right and left heel remanence of extensive plantar foot wounds. These are better than I remember by quite a big margin however he is still left with wounds on the left plantar heel and the right plantar heel. Been using endoform bilaterally. A culture was done that showed apparently Pseudomonas but we are still waiting for the [facility] antibiotic [name] use gentamicin  today. He is still very active by description I am not sure about the offloading of his noncasted right foot 10/20; both wounds right and left heel debrided not much change from last week. [name] Echevaria has arrived which is linezolid, gentamicin and ciprofloxacin we will use this with endoform. T contact cast on the left otal 06/02/2022: Both wounds are smaller today. There is still a fair amount of callus buildup around the right foot ulcer. The left is more superficial and nearly flush with the surrounding tissues. Also with slough and eschar buildup. 06/10/2022: The right sided wound appears [name] be nearly closed, if not completely so, although it is somewhat difficult [name] tell given the abnormal tissue and scarring in his foot. There is a fair amount of callus and crust accumulation. On the left, the wound looks about the same, again with callus and slough. He has an appointment next Thursday with Dr. Annamary Rummage in podiatry; I am hopeful that there may be some reconstructive options available for Mr. Goodridge. 06/16/2022: Both wounds have some eschar and callus accumulation. The right sided wound is extremely narrow and barely open; the left is narrower than last week. There is a little bit of slough. He has his appointment in podiatry later today. 06/23/2022: The patient met with Dr. Annamary Rummage last week and unfortunately, there are no reconstructive options that he believes would be helpful. He did order an MRI [name] evaluate for osteomyelitis and fortunately, none was seen. The left sided wound is a little bit shorter and narrower today. The right sided wound is about the same. There is callus and eschar accumulation bilaterally. 06/29/2022: Both feet have improved from last week. There is epithelium making a valiant effort [name] creep across the surface on the left. The right side looks like it got a little dry and the deep crevasse in his midfoot has cracked. Both have eschar and there is some slough on the  left. 07/07/2022: Both feet have improved. There is epithelium completely covering the calcaneus on the right with just a small opening in the crevasse in his midfoot. On the left, the open area of tissue is  smaller but he continues [name] build up callus/eschar and slough. 07/15/2022: The opening in the midfoot on the right is about the same size, covered with eschar and a little bit of slough. The open portion of the left wound is narrower and shorter with a bit of slough buildup. He admits [name] being on his feet more than recommended. 12/14; as far as I can tell everything on the right foot is closed. There is some eschar I removed some of this I cannot identify any open wound here. As usual this will be a very vulnerable area going forward. On the left this looks really quite healthy. I was pleasantly surprised [name] see how good this looked. Wound is certainly smaller and there appears [name] be healthy epithelialization. He has been using Promogran on the right and endoform on the left. He has been offloading the right foot with a heel offloading boot and he has a running shoe on the right foot 08/04/2022: The right foot remains closed. He has a thick cushioned insole in his sneaker. The left sided wound is smaller with just some slough and eschar accumulation. He is wearing the heel off loader on this foot. 08/15/2022: The right foot remains closed. The left wound has narrowed further. There is some slough and eschar accumulation. [name], [name] ([id number]) [phone].pdf Page 14 of 17 08/25/2022: We put him in a peg assist shoe insert and as a result, he has more epithelialization of the ulcer with minimal slough and eschar accumulation. 09/01/2022: The wound is smaller by about half this week. Still with some slough on the surface. The peg assist shoe seems [name] be doing a remarkable job of adequately offloading the site. 09/08/2022: There is a little bit more epithelium coming in.  There is some slough and callus buildup. 09/16/2022: The wound measures about the same size, but the epithelium that has grown in looks more robust and stronger. There is some slough and callus buildup. 09/23/2022: The wound remains about the same size. The skin edges are looking rather senescent. 09/29/2022: The aggressive debridement I performed last week seems [name] have been effective. The wound is smaller and has significantly less slough accumulation. 10/06/2022: There has been quite a bit of epithelialization since last week. There are still some open areas with slough accumulation. There is some callus buildup around the perimeter. 10/13/2022: Continued improvement. Minimal callus accumulation. 3/14; patient presents for follow-up. He has been using endoform [name] the wound bed without issues. He is using a surgical shoe with peg assist for offloading. 10/27/2022: The wound dimensions are stable. There is some senescent skin accumulation around the perimeter. 11/04/2022: Last week I performed a very aggressive debridement in an effort [name] stimulate the healing cascade. As has been the case when I have done this before, the wound has responded well. There has been epithelialization and contraction of the wound. There are just a couple of small open areas. 11/10/2022: Unfortunately, his foot got wet secondary [name] sweating and there has been some breakdown of the tissue, particularly the skin just adjacent [name] the main ulcer. 11/18/2022: We continue [name] struggle with moisture-related tissue breakdown around the perimeter of his wound. This has caused the thin epithelium that had formed on the surface [name] disintegrate. The underlying surface of the wound has good granulation tissue. 11/25/2022: The edges of the wound are much less macerated, but the surface is actually looking a little bit dry. There is slough accumulation. No obvious signs of infection. 12/01/2022: The wound  edges are more macerated and broken down,  making the wound larger. The surface has some slough on it. 12/08/2020: The wound looks better this week. There is significantly less maceration and moisture-related tissue breakdown. There is some slough on the wound surface. 12/15/2022: The wound continues [name] improve. There is almost no moisture-related tissue breakdown. There is epithelium beginning [name] fill in again from the edges. Light slough on the wound surface. 12/22/2022: More epithelium has filled him from around the edges. There is some slough on the surface. No evidence of moisture-related tissue breakdown. 01/05/2023: There has been more epithelialization, particularly at the posterior calcaneal aspect of the wound. There has been a little bit of moisture accumulation with minimal maceration. 01/12/2023: More epithelium has filled in. There is very minimal accumulation of slough. 01/20/2023: The wound is measuring a little bit smaller today. It did measure deeper, but this appears [name] be secondary [name] some heaped up senescent skin around the edges. The epithelium that has been present at the posterior aspect of the wound seems [name] be a bit more robust with greater integrity. 01/26/2023: He reports intense itching [name] the skin around the wound and there has been some breakdown here. It looks fungal in nature. The wound itself looks pretty good with improved epithelialization with just some slough buildup. He has a little callus along the wound edges. 02/02/2023: He has been treating the periwound skin with an over-the-counter athlete's foot medication and notes significant improvement in his pruritus. The skin looks better here, as well. The wound continues [name] epithelialize. There is light slough accumulation. 02/10/2023: The periwound skin still looks fairly angry. He reports multiple small blisters that have opened up. He is not having the intense itching that he had prior, however. There has been more epithelialization at the calcaneus and there is  minimal slough accumulation. 02/16/2023: The periwound skin is improving. An online review of dermatology rash images suggest that this may be hand-foot-and-mouth disease, isolated [name] the feet; the patient says he actually had this as a child. The calcaneus continues [name] epithelialize and the tissue was quite robust here. Very minimal slough accumulation on the open portion of the wound with a little bit of periwound callus buildup. 03/03/2023: The wound measurements are about the same, but there is more epithelium making its way [name] the surface. There is a little bit of crust and eschar around the edges and minimal slough on the surface. 03/09/2023: His dressing was applied rather haphazardly and the drawtex was not applied. The Optifoam was also reversed such that the orange side was in contact with his bare skin. He has some maceration around the wound edges, but this did not appear [name] result in any tissue breakdown. There is a little bit of slough on the surface and minimal eschar around the edges. 03/15/2023: The wound measured narrower today. There is some slough accumulation on the surface and some eschar around the edges. 03/23/2023: The length of the wound remains stable, but it continues [name] narrow. The skin that has covered the posterior aspect of the calcaneus is more robust and has better tensile integrity. Minimal slough and eschar accumulation. 03/30/2023: The wound length has decreased. This seems [name] be primarily due [name] more epithelium covering the posterior aspect of the site. Minimal slough and eschar buildup. 04/07/2023: The wound measured a little bit smaller again today. There is minimal slough accumulation with just a little bit of eschar and callus around the edges. 04/13/2023: The lateral aspect of his  wound is starting [name] epithelialize. There is very minimal slough on the surface. Moisture control is good. 04/20/2023: Continued, albeit extremely slow, encroachment of epithelium as of the  wound surface. Minimal slough. Moisture balance is good. 04/27/2023: More epithelium is filling in from the heel area. There is a little bit of slough on the surface with some periwound callus and eschar. He did note a little bit of drainage coming from his right foot and there is a small crack in the skin extending medially from the main site of his prior ulceration. 05/04/2023: The wounds are stable this week. No significant change. There is a little bit of callus buildup around the edges of each site and some thin slough on [name], [name] ([id number]) [phone].pdf Page 15 of 17 the surface on the left foot. 05/11/2023: The skin on the right heel is filling in further. There is a little bit of slough accumulation on both surfaces along with periwound callus accumulation. 05/18/2023: Both wounds are smaller today. The right foot is down [name] just a sliver of opening. 05/25/2023: Epithelium continues [name] advance from the calcaneal area towards the midfoot on the left. On the right, there is tiniest crack of an opening that remains. 06/01/2023: No significant change bilaterally. He has built up quite a bit of callus on the calcaneal surface of the right foot. 06/15/2023: He missed his appointment last week. The wounds measured larger today. There is some slough and eschar accumulation on both wounds. He was discussed in the [facility] wound camp last week. The consensus was in favor of proceeding with plastic surgery intervention but they also suggested repeating the MRI imaging of his heels [name] ensure no osteomyelitis present. 06/22/2023: The wounds are basically stable. His MRI is scheduled for tomorrow and his surgery for a week from tomorrow, 22 November. 12/9; patient I know from some months ago. 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 He saw Dr. [name] aylor from plastic surgery who is planning surgery. He is received his okay from pulmonology and  he sees cardiology on Wednesday. He has been using silver alginate [name] both wound areas. The area on the left heel actually looks fairly good healthy looking granulation some epithelialization towards the tip of the heel. Right heel is almost completely eschared. 07/28/2023: No significant change [name] the wounds. The MRIs have finally been read and there is no osteomyelitis seen in either heel. His surgery had [name] be rescheduled due [name] concerns from anesthesiology about his post-COVID cardiopulmonary status. He has been cleared by pulmonology, but cardiology wants to do some additional testing in advance of clearing him for surgery. I understand he is also planning [name] go ahead with bariatric surgery. 08/11/2023: The right wound is deeper with a lot of built-up crust, eschar, and slough. The left wound is smaller with more epithelialization. His echocardiogram is normal and his coronary calcium score is 0 however the coronary CTA was nondiagnostic due [name] poor opacification of vessels. He has previously been cleared by pulmonology [name] proceed with surgery. 08/23/2023: No significant change [name] either foot. He is awaiting a Lexiscan [name] get cardiac clearance for surgery. Objective Constitutional no acute distress. Vitals Time Taken: 9:13 AM, Height: 69 in, Weight: 280 lbs, BMI: 41.3, Temperature: 98.2 F, Pulse: 81 bpm, Respiratory Rate: 18 breaths/min, Blood Pressure: 133/73 mmHg. Respiratory Normal work of breathing on room air.. General Notes: 08/23/2023: No significant change [name] either foot. Integumentary (Hair, Skin) Wound #2 status is Open. Original  cause of wound was Pressure Injury. The date acquired was: 10/07/2019. The wound has been in treatment 162 weeks. The wound is located on the Left Calcaneus. The wound measures 3.6cm length x 1cm width x 0.2cm depth; 2.827cm^2 area and 0.565cm^3 volume. There is Fat Layer (Subcutaneous Tissue) exposed. There is no tunneling or undermining noted. There is a  medium amount of serosanguineous drainage noted. The wound margin is distinct with the outline attached [name] the wound base. There is medium (34-66%) pink granulation within the wound bed. There is a medium (34-66%) amount of necrotic tissue within the wound bed including Adherent Slough. The periwound skin appearance had no abnormalities noted for moisture. The periwound skin appearance had no abnormalities noted for color. The periwound skin appearance exhibited: Callus, Scarring. The periwound skin appearance did not exhibit: Crepitus, Excoriation, Induration, Rash. Periwound temperature was noted as No Abnormality. The periwound has tenderness on palpation. Wound #5 status is Open. Original cause of wound was Gradually Appeared. The date acquired was: 04/27/2023. The wound has been in treatment 16 weeks. The wound is located on the Right,Plantar Foot. The wound measures 1.5cm length x 0.3cm width x 0.6cm depth; 0.353cm^2 area and 0.212cm^3 volume. There is Fat Layer (Subcutaneous Tissue) exposed. There is no tunneling or undermining noted. There is a medium amount of serosanguineous drainage noted. The wound margin is distinct with the outline attached [name] the wound base. There is small (1-33%) pink, pale granulation within the wound bed. There is a small (1- 33%) amount of necrotic tissue within the wound bed including Eschar and Adherent Slough. The periwound skin appearance had no abnormalities noted for color. The periwound skin appearance exhibited: Callus, Scarring, Dry/Scaly. The periwound skin appearance did not exhibit: Rash. Periwound temperature was noted as No Abnormality. The periwound has tenderness on palpation. Assessment Active Problems ICD-10 Non-pressure chronic ulcer of other part of right foot with fat layer exposed Non-pressure chronic ulcer of other part of left foot with fat layer exposed Procedures [name], [name] ([id number]) [phone].pdf Page 16  of 17 Wound #2 Pre-procedure diagnosis of Wound #2 is a Pressure Ulcer located on the Left Calcaneus . There was a Excisional Skin/Subcutaneous Tissue Debridement with a total area of 2.83 sq cm performed by [name], MD. With the following instrument(s): Curette [name] remove Non-Viable tissue/material. Material removed includes Eschar, Subcutaneous Tissue, and Slough after achieving pain control using Lidocaine 5% topical ointment. No specimens were taken. A time out was conducted at 09:30, prior [name] the start of the procedure. A Minimum amount of bleeding was controlled with Pressure. The procedure was tolerated well. Post Debridement Measurements: 3.6cm length x 1cm width x 0.1cm depth; 0.283cm^3 volume. Post debridement Stage noted as Category/Stage III. Character of Wound/Ulcer Post Debridement is improved. Post procedure Diagnosis Wound #2: Same as Pre-Procedure Wound #5 Pre-procedure diagnosis of Wound #5 is a Pressure Ulcer located on the Right,Plantar Foot . There was a Excisional Skin/Subcutaneous Tissue Debridement with a total area of 0.35 sq cm performed by [name], MD. With the following instrument(s): Curette [name] remove Non-Viable tissue/material. Material removed includes Eschar, Subcutaneous Tissue, and Slough after achieving pain control using Lidocaine 5% topical ointment. No specimens were taken. A time out was conducted at 09:30, prior [name] the start of the procedure. A Minimum amount of bleeding was controlled with Pressure. The procedure was tolerated well. Post Debridement Measurements: 1.5cm length x 0.3cm width x 0.6cm depth; 0.212cm^3 volume. Post debridement Stage noted as Category/Stage II. Character of Wound/Ulcer  Post Debridement is improved. Post procedure Diagnosis Wound #5: Same as Pre-Procedure Plan Follow-up Appointments: Return Appointment in 2 weeks. - Dr. Lady Gary Anesthetic: Wound #2 Left Calcaneus: (In clinic) Topical Lidocaine 4% applied [name]  wound bed - In clinic Bathing/ Shower/ Hygiene: May shower and wash wound with soap and water. Off-Loading: Open toe surgical shoe [name]: - with peg assist insert both feet Other: - keep pressure off of the bottom of your feet. Elevate legs throughout the day. Use the Shoe with the PegAssist off-loading insole both feet Additional Orders / Instructions: Follow Nutritious Diet - Try [name] get 70-100 grams of Protein a day+ Coupons for Juven sent with patient. Other: - Vitamin C 500 mg per day [name] start, then increase [name] 500mg  twice a day. Also consider adding a zinc supplement. Juven Shake 1-2 times daily. The following medication(s) was prescribed: lidocaine topical 5 % ointment ointment topical once daily was prescribed at facility WOUND #2: - Calcaneus Wound Laterality: Left Cleanser: Normal Saline (Generic) 1 x Per Day/Other:until healed Discharge Instructions: Cleanse the wound with Normal Saline prior [name] applying a clean dressing using gauze sponges, not tissue or cotton balls. Cleanser: Wound Cleanser (Generic) 1 x Per Day/Other:until healed Discharge Instructions: Cleanse the wound with wound cleanser prior [name] applying a clean dressing using gauze sponges, not tissue or cotton balls. Peri-Wound Care: Ketoconazole Cream 2% (Generic) 1 x Per Day/Other:until healed Discharge Instructions: Apply Ketoconazole mixed with zinc [name] the periwound Peri-Wound Care: Zinc Oxide Ointment 30g tube (Generic) 1 x Per Day/Other:until healed Discharge Instructions: Apply Zinc Oxide [name] periwound with each dressing change Topical: Gentamicin (Generic) 1 x Per Day/Other:until healed Discharge Instructions: apply [name] the wound bed Topical: Ketoconazole Cream 2% 1 x Per Day/Other:until healed Discharge Instructions: Apply Ketoconazole [name] periwound Prim Dressing: Maxorb Extra Ag+ Alginate Dressing, 4x4.75 (in/in) (Generic) 1 x Per Day/Other:until healed ary Discharge Instructions: Apply [name] wound bed as  instructed Secondary Dressing: Drawtex 4x4 in (Generic) 1 x Per Day/Other:until healed Discharge Instructions: Apply over primary dressing as directed. Secondary Dressing: Optifoam Non-Adhesive Dressing, 4x4 in (Generic) 1 x Per Day/Other:until healed Discharge Instructions: As needed- Apply over primary dressing as directed. Secondary Dressing: Woven Gauze Sponge, Non-Sterile 4x4 in (Generic) 1 x Per Day/Other:until healed Discharge Instructions: Apply over primary dressing as directed. Secured With: 3M Medipore H Soft Cloth Surgical T ape, 4 x 10 (in/yd) (Generic) 1 x Per Day/Other:until healed Discharge Instructions: Secure with tape as directed. Com pression Wrap: Kerlix Roll 4.5x3.1 (in/yd) (Generic) 1 x Per Day/Other:until healed Discharge Instructions: Apply Kerlix and Coban compression as directed. WOUND #5: - Foot Wound Laterality: Plantar, Right Peri-Wound Care: Ketoconazole Cream 2% 1 x Per Day/30 Days Discharge Instructions: Apply Ketoconazole [name] periwound as needed Peri-Wound Care: Zinc Oxide Ointment 30g tube 1 x Per Day/30 Days Discharge Instructions: Apply Zinc Oxide [name] periwound with each dressing change Topical: Gentamicin 1 x Per Day/30 Days Discharge Instructions: As directed by physician Prim Dressing: Maxorb Extra Ag+ Alginate Dressing, 4x4.75 (in/in) 1 x Per Day/30 Days ary Discharge Instructions: Apply [name] wound bed as instructed Secondary Dressing: Drawtex 4x4 in 1 x Per Day/30 Days Discharge Instructions: As needed -Apply over primary dressing as directed. Secondary Dressing: Optifoam Non-Adhesive Dressing, 4x4 in 1 x Per Day/30 Days Discharge Instructions: Apply over primary dressing as directed. Secured With: [employer], 4.5x3.1 (in/yd) 1 x Per Day/30 Days Discharge Instructions: Secure with Kerlix as directed. 08/23/2023: No significant change [name] either foot. He is awaiting a Lexiscan [name]  get cardiac clearance for surgery. [name], KENION ([id number])  [phone].pdf Page 17 of 17 I used a curette [name] debride eschar, slough, and subcutaneous tissue from both of his wounds. We will continue the topical gentamicin (prophylaxis against frequent Pseudomonas infections) with silver alginate and bilateral peg assist shoes. Hopefully he will be able [name] complete the testing necessary [name] proceed with surgical intervention in fairly short order. Follow-up in 2 weeks. Electronic Signature(s) Signed: 08/23/2023 9:43:33 AM By: [name] MD FACS Entered By: [name] on 08/23/2023 09:43:33 -------------------------------------------------------------------------------- SuperBill Details Patient Name: Date of Service: [name], [name] [place] E. 08/23/2023 Medical Record Number: [id number] Patient Account Number: [id number] Date of Birth/Sex: Treating RN: 11/25/1975 (48 y.o. M) Primary Care Provider: Dorinda Hill Other Clinician: Referring Provider: Treating Provider/Extender: Jana Hakim in Treatment: 162 Diagnosis Coding ICD-10 Codes Code Description [phone] Non-pressure chronic ulcer of other part of right foot with fat layer exposed L97.522 Non-pressure chronic ulcer of other part of left foot with fat layer exposed Facility Procedures : CPT4 Code: [id number] Description: 11042 - DEB SUBQ TISSUE 20 SQ CM/< ICD-10 Diagnosis Description L97.512 Non-pressure chronic ulcer of other part of right foot with fat layer exposed L97.522 Non-pressure chronic ulcer of other part of left foot with fat layer exposed Modifier: Quantity: 1 Physician Procedures : CPT4 Code Description Modifier [id number] - WC PHYS LEVEL 4 - EST PT ICD-10 Diagnosis Description L97.512 Non-pressure chronic ulcer of other part of right foot with fat layer exposed L97.522 Non-pressure chronic ulcer of other part of left foot  with fat layer exposed Quantity: 1 : [id number] 11042 - WC PHYS SUBQ TISS 20 SQ CM ICD-10 Diagnosis  Description L97.512 Non-pressure chronic ulcer of other part of right foot with fat layer exposed L97.522 Non-pressure chronic ulcer of other part of left foot with fat layer exposed Quantity: 1 Electronic Signature(s) Signed: 08/23/2023 9:48:51 AM By: [name] MD FACS Entered By: [name] on 08/23/2023 09:48:50

## 2023-08-24 NOTE — Progress Notes (Signed)
TYWAIN, [name] ([id number]) [phone].pdf Page 1 of 10 Visit Report for 08/23/2023 Arrival Information Details Patient Name: Date of Service: [name], TO [name] 08/23/2023 9:30 A M Medical Record Number: [id number] Patient Account Number: [id number] Date of Birth/Sex: Treating RN: [birth year] (48 y.o. Cline Cools Primary Care Peri Kreft: Dorinda Hill Other Clinician: Referring Muad Noga: Treating Cherrise Occhipinti/Extender: Jana Hakim in Treatment: 162 Visit Information History Since Last Visit Added or deleted any medications: No Patient Arrived: Wheel Chair Any new allergies or adverse reactions: No Arrival Time: 09:12 Had a fall or experienced change in No Accompanied By: self activities of daily living that may affect Transfer Assistance: None risk of falls: Patient Identification Verified: Yes Signs or symptoms of abuse/neglect since last visito No Secondary Verification Process Completed: Yes Hospitalized since last visit: No Patient Requires Transmission-Based Precautions: No Implantable device outside of the clinic excluding No Patient Has Alerts: No cellular tissue based products placed in the center since last visit: Pain Present Now: Yes Electronic Signature(s) Signed: 08/23/2023 4:22:17 PM By: Redmond Pulling RN, BSN Entered By: Redmond Pulling on 08/23/2023 09:13:19 -------------------------------------------------------------------------------- Encounter Discharge Information Details Patient Name: Date of Service: [name], [name] 08/23/2023 9:30 A M Medical Record Number: [id number] Patient Account Number: [id number] Date of Birth/Sex: Treating RN: 04-Jun-1976 (48 y.o. Cline Cools Primary Care Alexius Ellington: Dorinda Hill Other Clinician: Referring Wenona Mayville: Treating Marquel Spoto/Extender: Jana Hakim in Treatment: 162 Encounter Discharge Information Items Post Procedure Vitals Discharge Condition:  Stable Temperature (F): 98.2 Ambulatory Status: Wheelchair Pulse (bpm): 81 Discharge Destination: Home Respiratory Rate (breaths/min): 18 Transportation: Private Auto Blood Pressure (mmHg): 133/73 Accompanied By: self Schedule Follow-up Appointment: No Clinical Summary of Care: Electronic Signature(s) Signed: 08/23/2023 4:22:17 PM By: Redmond Pulling RN, BSN Entered By: Redmond Pulling on 08/23/2023 09:37:11 [name] ([id number]) [id number].pdf Page 2 of 10 -------------------------------------------------------------------------------- Lower Extremity Assessment Details Patient Name: Date of Service: [name] [name] 08/23/2023 9:30 A M Medical Record Number: [id number] Patient Account Number: [id number] Date of Birth/Sex: Treating RN: Feb 13, 1976 (48 y.o. Cline Cools Primary Care Dyland Panuco: Dorinda Hill Other Clinician: Referring Avery Klingbeil: Treating Catriona Dillenbeck/Extender: Jana Hakim in Treatment: 162 Edema Assessment Assessed: [name]: No] Franne Forts: No] Edema: [Left: Yes] [Right: Yes] Calf Left: Right: Point of Measurement: 29 cm From Medial Instep 42 cm 44 cm Ankle Left: Right: Point of Measurement: 9 cm From Medial Instep 26 cm 24 cm Vascular Assessment Extremity colors, hair growth, and conditions: Extremity Color: [Left:Normal] [Right:Normal] Hair Growth on Extremity: [Left:Yes] [Right:Yes] Temperature of Extremity: [Left:Warm] Capillary Refill: [Left:< 3 seconds] [Right:< 3 seconds] Dependent Rubor: [Left:No No] [Right:No No] Electronic Signature(s) Signed: 08/23/2023 4:22:17 PM By: Redmond Pulling RN, BSN Entered By: Redmond Pulling on 08/23/2023 09:57:27 -------------------------------------------------------------------------------- Multi Wound Chart Details Patient Name: Date of Service: [name], [name] 08/23/2023 9:30 A M Medical Record Number: [id number] Patient Account Number: [id number] Date of Birth/Sex:  Treating RN: [birth year] (48 y.o. M) Primary Care Edword Cu: Dorinda Hill Other Clinician: Referring Edmund Rick: Treating Lani Mendiola/Extender: Jana Hakim in Treatment: 162 Vital Signs Height(in): 69 Pulse(bpm): 81 Weight(lbs): 280 Blood Pressure(mmHg): 133/73 Body Mass Index(BMI): 41.3 Temperature(F): 98.2 Respiratory Rate(breaths/min): 18 [2:Photos:] [N/A:N/A [phone].pdf Page 3 of 10] Left Calcaneus Right, Plantar Foot N/A Wound Location: Pressure Injury Gradually Appeared N/A Wounding Event: Pressure Ulcer Pressure Ulcer N/A Primary Etiology: Asthma, Angina, Hypertension Asthma, Angina, Hypertension N/A Comorbid History: 10/07/2019 04/27/2023 N/A Date Acquired: 162 16 N/A Weeks of Treatment: Open Open  N/A Wound Status: No No N/A Wound Recurrence: 3.6x1x0.2 1.5x0.3x0.6 N/A Measurements L x W x D (cm) 2.827 0.353 N/A A (cm) : rea 0.565 0.212 N/A Volume (cm) : 89.60% -1038.70% N/A % Reduction in A rea: 97.90% -6966.70% N/A % Reduction in Volume: Category/Stage III Category/Stage II N/A Classification: Medium Medium N/A Exudate A mount: Serosanguineous Serosanguineous N/A Exudate Type: red, brown red, brown N/A Exudate Color: Distinct, outline attached Distinct, outline attached N/A Wound Margin: Medium (34-66%) Small (1-33%) N/A Granulation A mount: Pink Pink, Pale N/A Granulation Quality: Medium (34-66%) Small (1-33%) N/A Necrotic A mount: Adherent Slough Eschar, Adherent Slough N/A Necrotic Tissue: Fat Layer (Subcutaneous Tissue): Yes Fat Layer (Subcutaneous Tissue): Yes N/A Exposed Structures: Fascia: No Fascia: No Tendon: No Tendon: No Muscle: No Muscle: No Joint: No Joint: No Bone: No Bone: No Medium (34-66%) Medium (34-66%) N/A Epithelialization: Debridement - Excisional Debridement - Excisional N/A Debridement: Pre-procedure Verification/Time Out 09:30 09:30 N/A Taken: Lidocaine 5% topical  ointment Lidocaine 5% topical ointment N/A Pain Control: Necrotic/Eschar, Subcutaneous, Necrotic/Eschar, Subcutaneous, N/A Tissue Debrided: [employer] Skin/Subcutaneous Tissue Skin/Subcutaneous Tissue N/A Level: 2.83 0.35 N/A Debridement A (sq cm): rea Curette Curette N/A Instrument: Minimum Minimum N/A Bleeding: Pressure Pressure N/A Hemostasis Achieved: Debridement Treatment Response: Procedure was tolerated well Procedure was tolerated well N/A Post Debridement Measurements L x 3.6x1x0.1 1.5x0.3x0.6 N/A W x D (cm) 0.283 0.212 N/A Post Debridement Volume: (cm) Category/Stage III Category/Stage II N/A Post Debridement Stage: Callus: Yes Callus: Yes N/A Periwound Skin Texture: Scarring: Yes Scarring: Yes Excoriation: No Rash: No Induration: No Crepitus: No Rash: No Dry/Scaly: Yes Dry/Scaly: Yes N/A Periwound Skin Moisture: Maceration: No Atrophie Blanche: No Hemosiderin Staining: No N/A Periwound Skin Color: Cyanosis: No Ecchymosis: No Erythema: No Hemosiderin Staining: No Mottled: No Pallor: No Rubor: No No Abnormality No Abnormality N/A Temperature: Yes Yes N/A Tenderness on Palpation: Debridement Debridement N/A Procedures Performed: Treatment Notes Wound #2 (Calcaneus) Wound Laterality: Left Cleanser Normal Saline Discharge Instruction: Cleanse the wound with Normal Saline prior to applying a clean dressing using gauze sponges, not tissue or cotton balls. Wound Cleanser Discharge Instruction: Cleanse the wound with wound cleanser prior to applying a clean dressing using gauze sponges, not tissue or cotton balls. Peri-Wound Care Ketoconazole Cream 2% Discharge Instruction: Apply Ketoconazole mixed with zinc to the periwound [name], [name] ([id number]) [phone].pdf Page 4 of 10 Zinc Oxide Ointment 30g tube Discharge Instruction: Apply Zinc Oxide to periwound with each dressing change Topical Gentamicin Discharge  Instruction: apply to the wound bed Ketoconazole Cream 2% Discharge Instruction: Apply Ketoconazole to periwound Primary Dressing Maxorb Extra Ag+ Alginate Dressing, 4x4.75 (in/in) Discharge Instruction: Apply to wound bed as instructed Secondary Dressing Drawtex 4x4 in Discharge Instruction: Apply over primary dressing as directed. Optifoam Non-Adhesive Dressing, 4x4 in Discharge Instruction: As needed- Apply over primary dressing as directed. Woven Gauze Sponge, Non-Sterile 4x4 in Discharge Instruction: Apply over primary dressing as directed. Secured With 77M Medipore H Soft Cloth Surgical T ape, 4 x 10 (in/yd) Discharge Instruction: Secure with tape as directed. Compression Wrap Kerlix Roll 4.5x3.1 (in/yd) Discharge Instruction: Apply Kerlix and Coban compression as directed. Compression Stockings Add-Ons Wound #5 (Foot) Wound Laterality: Plantar, Right Cleanser Peri-Wound Care Ketoconazole Cream 2% Discharge Instruction: Apply Ketoconazole to periwound as needed Zinc Oxide Ointment 30g tube Discharge Instruction: Apply Zinc Oxide to periwound with each dressing change Topical Gentamicin Discharge Instruction: As directed by physician Primary Dressing Maxorb Extra Ag+ Alginate Dressing, 4x4.75 (in/in) Discharge Instruction: Apply to wound bed as instructed  Secondary Dressing Drawtex 4x4 in Discharge Instruction: As needed -Apply over primary dressing as directed. Optifoam Non-Adhesive Dressing, 4x4 in Discharge Instruction: Apply over primary dressing as directed. Secured With [employer], 4.5x3.1 (in/yd) Discharge Instruction: Secure with Kerlix as directed. Compression Wrap Compression Stockings Add-Ons Electronic Signature(s) Signed: 08/23/2023 9:39:53 AM By: [name] MD FACS Entered By: [name] on 08/23/2023 09:39:53 [name] ([id number]) [id number].pdf Page 5 of  10 -------------------------------------------------------------------------------- Multi-Disciplinary Care Plan Details Patient Name: Date of Service: [name] TO [name] 08/23/2023 9:30 A M Medical Record Number: [id number] Patient Account Number: [id number] Date of Birth/Sex: Treating RN: 07-29-1976 (48 y.o. Cline Cools Primary Care Lucresia Simic: Dorinda Hill Other Clinician: Referring Nyron Mozer: Treating Eesa Justiss/Extender: Jana Hakim in Treatment: 162 Multidisciplinary Care Plan reviewed with physician Active Inactive Wound/Skin Impairment Nursing Diagnoses: Impaired tissue integrity Knowledge deficit related to ulceration/compromised skin integrity Goals: Patient/caregiver will verbalize understanding of skin care regimen Date Initiated: 07/15/2020 Target Resolution Date: 10/06/2023 Goal Status: Active Ulcer/skin breakdown will have a volume reduction of 30% by week 4 Date Initiated: 07/15/2020 Date Inactivated: 08/20/2020 Target Resolution Date: 09/03/2020 Goal Status: Unmet Unmet Reason: no major changes. Ulcer/skin breakdown will heal within 14 weeks Date Initiated: 12/04/2020 Date Inactivated: 12/10/2020 Target Resolution Date: 12/10/2020 Unmet Reason: wounds still open at 14 Goal Status: Unmet weeks and today 21 weeks. Interventions: Assess patient/caregiver ability to obtain necessary supplies Assess patient/caregiver ability to perform ulcer/skin care regimen upon admission and as needed Assess ulceration(s) every visit Provide education on ulcer and skin care Treatment Activities: Skin care regimen initiated : 07/15/2020 Topical wound management initiated : 07/15/2020 Notes: Electronic Signature(s) Signed: 08/23/2023 4:22:17 PM By: Redmond Pulling RN, BSN Entered By: Redmond Pulling on 08/23/2023 09:30:04 -------------------------------------------------------------------------------- Pain Assessment Details Patient Name: Date of Service: [name], [name] 08/23/2023 9:30 A M Medical Record Number: [id number] Patient Account Number: [id number] Date of Birth/Sex: Treating RN: 1976/05/12 (48 y.o. Cline Cools Primary Care Brydon Spahr: Dorinda Hill Other Clinician: Referring Faithanne Verret: Treating Deshannon Seide/Extender: Jana Hakim in Treatment: [address] [name], [name] ([id number]) [id number] Page 6 of 10 Location of Pain Severity and Description of Pain Patient Has Paino Yes Site Locations Rate the pain. Current Pain Level: 5 Pain Management and Medication Current Pain Management: Electronic Signature(s) Signed: 08/23/2023 4:22:17 PM By: Redmond Pulling RN, BSN Entered By: Redmond Pulling on 08/23/2023 09:13:36 -------------------------------------------------------------------------------- Patient/Caregiver Education Details Patient Name: Date of Service: [name], TO [place] 1/15/2025andnbsp9:30 A M Medical Record Number: [id number] Patient Account Number: [id number] Date of Birth/Gender: Treating RN: 1976-05-13 (48 y.o. Cline Cools Primary Care Physician: Dorinda Hill Other Clinician: Referring Physician: Treating Physician/Extender: Jana Hakim in Treatment: 162 Education Assessment Education Provided To: Patient Education Topics Provided Wound/Skin Impairment: Methods: Explain/Verbal Responses: State content correctly [employer]) Signed: 08/23/2023 4:22:17 PM By: Redmond Pulling RN, BSN Entered By: Redmond Pulling on 08/23/2023 09:30:41 -------------------------------------------------------------------------------- Wound Assessment Details Patient Name: Date of Service: [name], [name] 08/23/2023 9:30 A [name] ([id number]) [id number] Page 7 of 10 Medical Record Number: [id number] Patient Account Number: [id number] Date of Birth/Sex: Treating RN: [birth year] (47 y.o. Cline Cools Primary Care Suraiya Dickerson: Dorinda Hill Other Clinician: Referring [name] [name]: Treating Fontaine Kossman/Extender: Jana Hakim in Treatment: 162 Wound Status Wound Number: 2 Primary Etiology: Pressure Ulcer Wound Location: Left Calcaneus Wound Status: Open Wounding Event: Pressure Injury Comorbid History: Asthma, Angina, Hypertension Date Acquired: 10/07/2019 Weeks Of  Treatment: 162 Clustered Wound: No Photos Wound Measurements Length: (cm) 3.6 Width: (cm) 1 Depth: (cm) 0.2 Area: (cm) 2.827 Volume: (cm) 0.565 % Reduction in Area: 89.6% % Reduction in Volume: 97.9% Epithelialization: Medium (34-66%) Tunneling: No Undermining: No Wound Description Classification: Category/Stage III Wound Margin: Distinct, outline attached Exudate Amount: Medium Exudate Type: Serosanguineous Exudate Color: red, brown Foul Odor After Cleansing: No Slough/Fibrino Yes Wound Bed Granulation Amount: Medium (34-66%) Exposed Structure Granulation Quality: Pink Fascia Exposed: No Necrotic Amount: Medium (34-66%) Fat Layer (Subcutaneous Tissue) Exposed: Yes Necrotic Quality: Adherent Slough Tendon Exposed: No Muscle Exposed: No Joint Exposed: No Bone Exposed: No Periwound Skin Texture Texture Color No Abnormalities Noted: No No Abnormalities Noted: Yes Callus: Yes Temperature / Pain Crepitus: No Temperature: No Abnormality Excoriation: No Tenderness on Palpation: Yes Induration: No Rash: No Scarring: Yes Moisture No Abnormalities Noted: Yes Treatment Notes Wound #2 (Calcaneus) Wound Laterality: Left Cleanser Normal Saline Discharge Instruction: Cleanse the wound with Normal Saline prior to applying a clean dressing using gauze sponges, not tissue or cotton balls. Wound Cleanser Discharge Instruction: Cleanse the wound with wound cleanser prior to applying a clean dressing using gauze sponges, not tissue or cotton balls. [name], [name] ([id number])  [phone].pdf Page 8 of 10 Peri-Wound Care Ketoconazole Cream 2% Discharge Instruction: Apply Ketoconazole mixed with zinc to the periwound Zinc Oxide Ointment 30g tube Discharge Instruction: Apply Zinc Oxide to periwound with each dressing change Topical Gentamicin Discharge Instruction: apply to the wound bed Ketoconazole Cream 2% Discharge Instruction: Apply Ketoconazole to periwound Primary Dressing Maxorb Extra Ag+ Alginate Dressing, 4x4.75 (in/in) Discharge Instruction: Apply to wound bed as instructed Secondary Dressing Drawtex 4x4 in Discharge Instruction: Apply over primary dressing as directed. Optifoam Non-Adhesive Dressing, 4x4 in Discharge Instruction: As needed- Apply over primary dressing as directed. Woven Gauze Sponge, Non-Sterile 4x4 in Discharge Instruction: Apply over primary dressing as directed. Secured With 31M Medipore H Soft Cloth Surgical T ape, 4 x 10 (in/yd) Discharge Instruction: Secure with tape as directed. Compression Wrap Kerlix Roll 4.5x3.1 (in/yd) Discharge Instruction: Apply Kerlix and Coban compression as directed. Compression Stockings Add-Ons Electronic Signature(s) Signed: 08/23/2023 4:22:17 PM By: Redmond Pulling RN, BSN Entered By: Redmond Pulling on 08/23/2023 09:23:50 -------------------------------------------------------------------------------- Wound Assessment Details Patient Name: Date of Service: [name], TO [name] 08/23/2023 9:30 A M Medical Record Number: [id number] Patient Account Number: [id number] Date of Birth/Sex: Treating RN: October 02, 1975 (48 y.o. Cline Cools Primary Care Jacey Eckerson: Dorinda Hill Other Clinician: Referring Gage Weant: Treating Treylin Burtch/Extender: Jana Hakim in Treatment: 162 Wound Status Wound Number: 5 Primary Etiology: Pressure Ulcer Wound Location: Right, Plantar Foot Wound Status: Open Wounding Event: Gradually Appeared Comorbid History: Asthma,  Angina, Hypertension Date Acquired: 04/27/2023 Weeks Of Treatment: 16 Clustered Wound: No Photos [name], [name] ([id number]) [phone].pdf Page 9 of 10 Wound Measurements Length: (cm) 1.5 Width: (cm) 0.3 Depth: (cm) 0.6 Area: (cm) 0.353 Volume: (cm) 0.212 % Reduction in Area: -1038.7% % Reduction in Volume: -6966.7% Epithelialization: Medium (34-66%) Tunneling: No Undermining: No Wound Description Classification: Category/Stage II Wound Margin: Distinct, outline attached Exudate Amount: Medium Exudate Type: Serosanguineous Exudate Color: red, brown Foul Odor After Cleansing: No Slough/Fibrino Yes Wound Bed Granulation Amount: Small (1-33%) Exposed Structure Granulation Quality: Pink, Pale Fascia Exposed: No Necrotic Amount: Small (1-33%) Fat Layer (Subcutaneous Tissue) Exposed: Yes Necrotic Quality: Eschar, Adherent Slough Tendon Exposed: No Muscle Exposed: No Joint Exposed: No Bone Exposed: No Periwound Skin Texture Texture Color No Abnormalities Noted: No No Abnormalities Noted: Yes Callus: Yes  Temperature / Pain Rash: No Temperature: No Abnormality Scarring: Yes Tenderness on Palpation: Yes Moisture No Abnormalities Noted: No Dry / Scaly: Yes Treatment Notes Wound #5 (Foot) Wound Laterality: Plantar, Right Cleanser Peri-Wound Care Ketoconazole Cream 2% Discharge Instruction: Apply Ketoconazole to periwound as needed Zinc Oxide Ointment 30g tube Discharge Instruction: Apply Zinc Oxide to periwound with each dressing change Topical Gentamicin Discharge Instruction: As directed by physician Primary Dressing Maxorb Extra Ag+ Alginate Dressing, 4x4.75 (in/in) Discharge Instruction: Apply to wound bed as instructed Secondary Dressing Drawtex 4x4 in Discharge Instruction: As needed -Apply over primary dressing as directed. Optifoam Non-Adhesive Dressing, 4x4 in Discharge Instruction: Apply over primary dressing as directed. Secured  With [name], [name] ([id number]) [id number] Page 10 of 10 Kerlix Roll Sterile, 4.5x3.1 (in/yd) Discharge Instruction: Secure with Kerlix as directed. Compression Wrap Compression Stockings Add-Ons Electronic Signature(s) Signed: 08/23/2023 4:22:17 PM By: Redmond Pulling RN, BSN Entered By: Redmond Pulling on 08/23/2023 09:24:23 -------------------------------------------------------------------------------- Vitals Details Patient Name: Date of Service: [name], [name] 08/23/2023 9:30 A M Medical Record Number: [id number] Patient Account Number: [id number] Date of Birth/Sex: Treating RN: May 21, 1976 (48 y.o. Cline Cools Primary Care Nathifa Ritthaler: Dorinda Hill Other Clinician: Referring Aeliana Spates: Treating Shaneil Yazdi/Extender: Jana Hakim in Treatment: 162 Vital Signs Time Taken: 09:13 Temperature (F): 98.2 Height (in): 69 Pulse (bpm): 81 Weight (lbs): 280 Respiratory Rate (breaths/min): 18 Body Mass Index (BMI): 41.3 Blood Pressure (mmHg): 133/73 Reference Range: 80 - 120 mg / dl Electronic Signature(s) Signed: 08/23/2023 4:22:17 PM By: Redmond Pulling RN, BSN Entered By: Redmond Pulling on 08/23/2023 09:14:23

## 2023-08-25 ENCOUNTER — Encounter: Payer: PPO | Attending: Surgery | Admitting: Dietician

## 2023-08-25 ENCOUNTER — Encounter: Payer: Self-pay | Admitting: Dietician

## 2023-08-25 VITALS — Ht 68.5 in | Wt 316.3 lb

## 2023-08-25 DIAGNOSIS — E119 Type 2 diabetes mellitus without complications: Secondary | ICD-10-CM | POA: Insufficient documentation

## 2023-08-25 DIAGNOSIS — E669 Obesity, unspecified: Secondary | ICD-10-CM | POA: Insufficient documentation

## 2023-08-25 NOTE — Progress Notes (Signed)
Supervised Weight Loss Visit Bariatric Nutrition Education Appt Start Time: 1058    End Time: 1149  Planned surgery: Sleeve Gastrectomy (pt states the surgeon is suggesting the sleeve, he states he is leaning toward the RYGB)  Pt expectation of surgery: more confidence and being healthier; do more with family  Referral stated Supervised Weight Loss (SWL) visits needed: 5  5 out of 5 SWL Appointments   Not cleared at this time:  Pt to follow up for minimum of one more visit to assist pt with progressing through stages of change/further nutrition education. RD advised pt that this follow up visit is not mandated through insurance. Pt verbalized agreement.  NUTRITION ASSESSMENT   Anthropometrics  Start weight at [facility]: 303.9 lbs (date: 04/14/2023) Height: 68.5 in Weight today: 316.3 lbs BMI: 47.27 kg/m2     Clinical   Pharmacotherapy: History of weight loss medication used: Ozempic and Mounjaro, stating insurance started not covering.  Medical hx: asthma, obesity, sleep apnea, HTN, depression, anxiety Medications: vit D, nasacort, desyrel, miagra, protonix, singulair,  lamictal, atrovent, norco, synalar, cymbalta, symbicort, norvasc, aluterol,  Labs: vit D 21.1; HDL 33; A1c 6.1; sodium 145; albumin 4.0; RBC 6.06; platelets 132 Notable signs/symptoms: wounds from pressure area in the hospital years ago; arrived in wheel chair Any previous deficiencies? No  Lifestyle & Dietary Hx  Pt states he is going to have foot surgery soon, stating his foot ulcers are getting better. Pt states he is scheduled to have his heart checked (stress test) next week. Pt states he is trying to drink mostly water, stating he has not been able to give up regular or diet soda. Pt states he has done well eating more non-starchy vegetables, and more water. Pt states he has had trouble practicing not drinking with his meals. Pt states he has been released from physical therapy, stating he got as far as he  could. Pt states the physical therapist wanted him to do water therapy, stating he can't due to his foot ulcers. Pt has not Pt states he gets a protein with every meal or snack. Pt states states he needs to focus more on not drinking with his meals or snacks. Pt struggling to with changes and creating habits now for success after surgery.  Estimated daily fluid intake: 64+ oz Supplements: vit D, vit C, zinc Current average weekly physical activity: ADLs  24-Hr Dietary Recall First Meal: skip or 4 egg whites, 1-2 slices of sara lee delights bread, sf jam, [place] sausage, Fairlife fat free milk or fruit and peanut butter Snack: fruit or protein shake or triscuit or wheat thin or cheese its Second Meal: salad with lean meat (deli), lean cuisine steamer bowl or spring mix salad with sliced meat and cheese. Snack: fruit or protein shake or triscuit or wheat thin or cheese its Third Meal: varies, lean meat, vegetable, rice Snack: sometimes baked cheese its Beverages: water, one regular soda a day or every other day  Estimated Energy Needs Calories: 1600  NUTRITION DIAGNOSIS  Overweight/obesity ([place]-3.3) related to past poor dietary habits and physical inactivity as evidenced by patient w/ planned sleeve or RYGB surgery following dietary guidelines for continued weight loss.  NUTRITION INTERVENTION  Nutrition counseling (C-1) and education (E-2) to facilitate bariatric surgery goals.  Encouraged patient to honor their body's internal hunger and fullness cues.  Throughout the day, check in mentally and rate hunger. Stop eating when satisfied not full regardless of how much food is left on the plate.  Get  more if still hungry 20-30 minutes later.  The key is to honor satisfaction so throughout the meal, rate fullness factor and stop when comfortably satisfied not physically full. The key is to honor hunger and fullness without any feelings of guilt or shame.  Pay attention to what the internal cues  are, rather than any external factors. This will enhance the confidence you have in listening to your own body and following those internal cues enabling you to increase how often you eat when you are hungry not out of appetite and stop when you are satisfied not full.  Encouraged pt to continue to eat balanced meals inclusive of non starchy vegetables 2 times a day 7 days a week Encouraged pt to choose lean protein sources: limiting beef, pork, sausage, hotdogs, and lunch meat Encourage pt to choose healthy fats such as plant based limiting animal fats Encouraged pt to continue to drink a minium 64 fluid ounces with half being plain water to satisfy proper hydration. Pt to follow up for minimum of one more visit to assist pt with progressing through stages of change/further nutrition education. Protein: Incorporate lean sources of protein, such as poultry, fish, beans, nuts, and seeds, into your meals. Protein is essential for building and repairing tissues, staying full, balancing blood sugar, as well as supporting immune function. Dairy: Include low-fat or fat-free dairy products like milk, yogurt, and cheese in your diet. Dairy foods are excellent sources of calcium and vitamin D, which are crucial for bone health.  Physical Activity: Aim for 150 minutes of physical activity weekly. Regular physical activity promotes overall health-including helping to reduce risk for heart disease and diabetes, promoting mental health, and helping [place] sleep better.    Pre-Op Goals Progress & New Goals Continue: include a protein food with every meal or snacks Continue: avoid skipping meals; eat every 3-5 hours; keep sleep routine and scheduled meals; meal prepping and meal planning. Continue: Eat non starchy vegetables 2 times or more a day 7 days a week. Continue: practice separating fluids from your meals and snacks; stop drinking 15 minutes before you eat and don't drink while you eat and for 30 minutes  after. Continue: increase physical activity; use physical therapy exercises Continue: slow down at meals and snacks, chew well, aim for satisfaction before fullness, take 20-30 minutes to eat. Re-engage/Continue: avoid sugar sweetened beverages. Aim for mostly plain water. Limit fluids to zero calorie (max 5 calories) and no carbonation.  Handouts Provided Include  Goals Printed List of all pre-op goals  Learning Style & Readiness for Change Teaching method utilized: Visual & Auditory  Demonstrated degree of understanding via: Teach Back  Readiness Level: contemplative Barriers to learning/adherence to lifestyle change: mobility, motivation  RD's Notes for next Visit  Patient progress toward chosen goals.  MONITORING & EVALUATION Dietary intake, weekly physical activity, body weight, and pre-op goals.  Next Steps  Pt to follow up for minimum of one more visit to assist pt with progressing through stages of change/further nutrition education.

## 2023-08-29 ENCOUNTER — Ambulatory Visit: Payer: PPO | Attending: Cardiology

## 2023-08-29 DIAGNOSIS — R0602 Shortness of breath: Secondary | ICD-10-CM

## 2023-08-29 MED ORDER — TECHNETIUM TC 99M TETROFOSMIN IV KIT
32.0000 | PACK | Freq: Once | INTRAVENOUS | Status: AC | PRN
Start: 2023-08-29 — End: 2023-08-29
  Administered 2023-08-29: 32 via INTRAVENOUS

## 2023-08-29 MED ORDER — REGADENOSON 0.4 MG/5ML IV SOLN
0.4000 mg | Freq: Once | INTRAVENOUS | Status: AC
  Administered 2023-08-29: 0.4 mg via INTRAVENOUS

## 2023-08-30 ENCOUNTER — Ambulatory Visit: Payer: PPO | Attending: Cardiology

## 2023-08-30 MED ORDER — TECHNETIUM TC 99M TETROFOSMIN IV KIT
32.1000 | PACK | Freq: Once | INTRAVENOUS | Status: AC | PRN
Start: 2023-08-30 — End: 2023-08-30
  Administered 2023-08-30: 32.1 via INTRAVENOUS

## 2023-09-01 LAB — MYOCARDIAL PERFUSION IMAGING
LV dias vol: 145 mL (ref 62–150)
LV sys vol: 55 mL
Nuc Stress EF: 62 %
Peak HR: 101 {beats}/min
Rest HR: 73 {beats}/min
Rest Nuclear Isotope Dose: 32.1 mCi
SDS: 3
SRS: 0
SSS: 3
ST Depression (mm): 0 mm
Stress Nuclear Isotope Dose: 32 mCi
TID: 0.82

## 2023-09-07 ENCOUNTER — Encounter (HOSPITAL_BASED_OUTPATIENT_CLINIC_OR_DEPARTMENT_OTHER): Payer: PPO | Admitting: General Surgery

## 2023-09-07 ENCOUNTER — Telehealth: Payer: Self-pay | Admitting: Plastic Surgery

## 2023-09-07 DIAGNOSIS — L97512 Non-pressure chronic ulcer of other part of right foot with fat layer exposed: Secondary | ICD-10-CM | POA: Diagnosis not present

## 2023-09-07 DIAGNOSIS — L89623 Pressure ulcer of left heel, stage 3: Secondary | ICD-10-CM | POA: Diagnosis not present

## 2023-09-07 DIAGNOSIS — L89892 Pressure ulcer of other site, stage 2: Secondary | ICD-10-CM | POA: Diagnosis not present

## 2023-09-07 NOTE — Telephone Encounter (Signed)
Patient was told that he needed clearance from cardiology before scheduling surgery, he say he's cleared but I told him that they would let us know if we requested it

## 2023-09-14 NOTE — Telephone Encounter (Signed)
 Pt is calling again wondering what he needs to do to move forward with sx, was last seen last year in november

## 2023-09-21 ENCOUNTER — Encounter (HOSPITAL_BASED_OUTPATIENT_CLINIC_OR_DEPARTMENT_OTHER): Payer: PPO | Attending: General Surgery | Admitting: General Surgery

## 2023-09-21 DIAGNOSIS — L97522 Non-pressure chronic ulcer of other part of left foot with fat layer exposed: Secondary | ICD-10-CM | POA: Diagnosis not present

## 2023-09-21 DIAGNOSIS — L89892 Pressure ulcer of other site, stage 2: Secondary | ICD-10-CM | POA: Diagnosis not present

## 2023-09-21 DIAGNOSIS — L97512 Non-pressure chronic ulcer of other part of right foot with fat layer exposed: Secondary | ICD-10-CM | POA: Diagnosis not present

## 2023-09-21 DIAGNOSIS — L89623 Pressure ulcer of left heel, stage 3: Secondary | ICD-10-CM | POA: Diagnosis not present

## 2023-09-22 ENCOUNTER — Encounter: Payer: PPO | Attending: Surgery | Admitting: Dietician

## 2023-09-22 ENCOUNTER — Encounter: Payer: Self-pay | Admitting: Dietician

## 2023-09-22 VITALS — Ht 68.5 in | Wt 317.6 lb

## 2023-09-22 DIAGNOSIS — E669 Obesity, unspecified: Secondary | ICD-10-CM | POA: Diagnosis not present

## 2023-09-22 DIAGNOSIS — E119 Type 2 diabetes mellitus without complications: Secondary | ICD-10-CM | POA: Diagnosis not present

## 2023-09-22 NOTE — Progress Notes (Signed)
Supervised Weight Loss Visit Bariatric Nutrition Education Appt Start Time: 1054    End Time: 1135  Planned surgery: Sleeve Gastrectomy (pt states the surgeon is suggesting the sleeve, he states he is leaning toward the RYGB)  Pt expectation of surgery: more confidence and being healthier; do more with family  Referral stated Supervised Weight Loss (SWL) visits needed: 5  6 out of 5 SWL Appointments   Not cleared at this time:  Pt to follow up for minimum of one more visit to assist pt with progressing through stages of change/further nutrition education. RD advised pt that this follow up visit is not mandated through insurance. Pt verbalized agreement.  Pt will follow-up after foot surgery, and we will reassess progress at that time  NUTRITION ASSESSMENT   Anthropometrics  Start weight at [facility]: 303.9 lbs (date: 04/14/2023) Height: 68.5 in Weight today: 317.6 lbs BMI: 47.59 kg/m2     Clinical   Pharmacotherapy: History of weight loss medication used: Ozempic and Mounjaro, stating insurance started not covering.  Medical hx: asthma, obesity, sleep apnea, HTN, depression, anxiety Medications: vit D, nasacort, desyrel, miagra, protonix, singulair,  lamictal, atrovent, norco, synalar, cymbalta, symbicort, norvasc, aluterol,  Labs: vit D 21.1; HDL 33; A1c 6.1; sodium 145; albumin 4.0; RBC 6.06; platelets 132 Notable signs/symptoms: wounds from pressure area in the hospital years ago; arrived in wheel chair Any previous deficiencies? No  Lifestyle & Dietary Hx  Pt arrived with wife. Pt states he has not been doing well making changes with goals he has been working on. Pt states he is scheduling foot surgery today for the near future, stating he is waiting on a phone call.  Pt states he started school, and is taking classes. Pt states he gets at least 80 grams of protein to meet goals for foot ulcers. Pt's wife states they got some sf tea (milos) to substitute for soda and sweet  tea.  Estimated daily fluid intake: 64+ oz Supplements: vit D, vit C, zinc Current average weekly physical activity: ADLs  24-Hr Dietary Recall First Meal: skip or 4 egg whites, 1-2 slices of sara lee delights bread, sf jam, [place] sausage, Fairlife fat free milk or fruit and peanut butter Snack: fruit or protein shake or triscuit or wheat thin or cheese its Second Meal: salad with lean meat (deli), lean cuisine steamer bowl or spring mix salad with sliced meat and cheese. Snack: fruit or protein shake or triscuit or wheat thin or cheese its Third Meal: varies, lean meat, vegetable, rice Snack: sometimes baked cheese its Beverages: water, one regular soda a day or every other day  Estimated Energy Needs Calories: 1600  NUTRITION DIAGNOSIS  Overweight/obesity ([place]-3.3) related to past poor dietary habits and physical inactivity as evidenced by patient w/ planned sleeve or RYGB surgery following dietary guidelines for continued weight loss.  NUTRITION INTERVENTION  Nutrition counseling (C-1) and education (E-2) to facilitate bariatric surgery goals.  Encouraged patient to honor their body's internal hunger and fullness cues.  Throughout the day, check in mentally and rate hunger. Stop eating when satisfied not full regardless of how much food is left on the plate.  Get more if still hungry 20-30 minutes later.  The key is to honor satisfaction so throughout the meal, rate fullness factor and stop when comfortably satisfied not physically full. The key is to honor hunger and fullness without any feelings of guilt or shame.  Pay attention to what the internal cues are, rather than any external factors. This  will enhance the confidence you have in listening to your own body and following those internal cues enabling you to increase how often you eat when you are hungry not out of appetite and stop when you are satisfied not full.  Encouraged pt to continue to eat balanced meals inclusive of non  starchy vegetables 2 times a day 7 days a week Encouraged pt to choose lean protein sources: limiting beef, pork, sausage, hotdogs, and lunch meat Encourage pt to choose healthy fats such as plant based limiting animal fats Encouraged pt to continue to drink a minium 64 fluid ounces with half being plain water to satisfy proper hydration. Pt to follow up for minimum of one more visit to assist pt with progressing through stages of change/further nutrition education. Protein: Incorporate lean sources of protein, such as poultry, fish, beans, nuts, and seeds, into your meals. Protein is essential for building and repairing tissues, staying full, balancing blood sugar, as well as supporting immune function. Dairy: Include low-fat or fat-free dairy products like milk, yogurt, and cheese in your diet. Dairy foods are excellent sources of calcium and vitamin D, which are crucial for bone health.  Physical Activity: Aim for 150 minutes of physical activity weekly. Regular physical activity promotes overall health-including helping to reduce risk for heart disease and diabetes, promoting mental health, and helping [place] sleep better.   Pre-Op Goals Progress & New Goals Continue: include a protein food with every meal or snacks Continue: avoid skipping meals; eat every 3-5 hours; keep sleep routine and scheduled meals; meal prepping and meal planning; smaller portions. Continue: Eat non starchy vegetables 2 times or more a day 7 days a week. Continue: practice separating fluids from your meals and snacks; stop drinking 15 minutes before you eat and don't drink while you eat and for 30 minutes after. Continue: increase physical activity; use physical therapy exercises Continue: slow down at meals and snacks, chew well, aim for satisfaction before fullness, take 20-30 minutes to eat. Re-engage/Continue: avoid sugar sweetened beverages. Aim for mostly plain water. Limit fluids to zero calorie (max 5 calories) and  no carbonation.  Handouts Provided Include  Meal Ideas Bariatric MyPlate  Learning Style & Readiness for Change Teaching method utilized: Visual & Auditory  Demonstrated degree of understanding via: Teach Back  Readiness Level: contemplative Barriers to learning/adherence to lifestyle change: mobility, motivation  RD's Notes for next Visit  Patient progress toward chosen goals.  MONITORING & EVALUATION Dietary intake, weekly physical activity, body weight, and pre-op goals.  Next Steps  Pt will follow-up after foot surgery, and we will reassess progress at that time

## 2023-09-25 ENCOUNTER — Telehealth: Payer: Self-pay | Admitting: Plastic Surgery

## 2023-09-25 ENCOUNTER — Telehealth: Payer: Self-pay | Admitting: Cardiology

## 2023-09-25 ENCOUNTER — Telehealth: Payer: Self-pay

## 2023-09-25 NOTE — Telephone Encounter (Signed)
Called pt 09-25-23 and spoke with him, to make him aware that he has not been cleared by [facility] yet.

## 2023-09-25 NOTE — Telephone Encounter (Signed)
[facility]ed [facility] 09-25-23, pt has not been cleared, its still under review with [facility]

## 2023-09-25 NOTE — Telephone Encounter (Signed)
Patient called asking about scheduling surgery, Please advise

## 2023-09-25 NOTE — Telephone Encounter (Signed)
They are calling to see if we received pre-op clearance form today. They would like a call back

## 2023-09-25 NOTE — Telephone Encounter (Signed)
   Pre-operative Risk Assessment    Patient Name: [name]  DOB: Aug 20, 1975 MRN: [id number]   Date of last office visit: 07/19/2023 Date of next office visit: 9 month recall   Request for Surgical Clearance    Procedure:   left plantar surface wound preparation and myriad placement  Date of Surgery:  Clearance TBD        REQUEST RESPONSE BY                         Surgeon:  Dr. Weyman Croon Surgeon's Group or Practice Name:  [facility] Phone number:  [phone]  Fax number:  [phone]   Type of Clearance Requested:   - Medical    Type of Anesthesia:  Not Indicated   Additional requests/questions:    Signed, Eleonore Chiquito   09/25/2023, 4:28 PM

## 2023-09-25 NOTE — Telephone Encounter (Addendum)
Faxed surgery clearance to Belva Crome, MD to filled out and signed, although he was cleared on 07/19/2023 when patient was seen by physician in the office.  Fax confirmation received:  This message was sent via FAXCOM, a product from [employer]. [email or web address].[email or web address]/                    -------Fax Transmission Report-------  To:               Recipient at [phone] Subject:          [name] Surgery Clearance Result:           The transmission was successful. Explanation:      All Pages Ok Pages Sent:       9 Connect Time:     4 minutes, 17 seconds Transmit Time:    09/25/2023 10:10 Transfer Rate:    14400 Status Code:      0000 Retry Count:      0 Job Id:           [id number] Unique Id:        [id number] Fax Line:         14 Fax Server:       [employer]

## 2023-09-26 NOTE — Telephone Encounter (Signed)
   Name: [name]  DOB: [birth year]  MRN: [id number]   Primary Cardiologist: None  Chart reviewed as part of pre-operative protocol coverage. [name] was last seen on 07/19/2023 by Dr. Tomie China.  At that time patient complained of DOE.  He underwent a thorough workup for risk stratification.  Coronary CTA demonstrated coronary calcium score of 0.  Nuclear stress testing performed in January 2025 was a normal, low risk study.  Echo January 2025 showed normal LV/RV function with no significant valvular abnormalities.  Spoke with patient via telephone today and he denies new cardiac concerns.  Therefore, based on ACC/AHA guidelines, the patient would be an acceptable risk for the planned procedure without further cardiovascular testing.   I will route this recommendation to the requesting party via Epic fax function and remove from pre-op pool. Please call with questions.  Carlos Levering, NP 09/26/2023, 11:10 AM

## 2023-10-02 ENCOUNTER — Ambulatory Visit (HOSPITAL_BASED_OUTPATIENT_CLINIC_OR_DEPARTMENT_OTHER): Payer: PPO | Admitting: Pulmonary Disease

## 2023-10-02 ENCOUNTER — Encounter (HOSPITAL_BASED_OUTPATIENT_CLINIC_OR_DEPARTMENT_OTHER): Payer: Self-pay | Admitting: Pulmonary Disease

## 2023-10-02 VITALS — BP 126/84 | HR 73 | Ht 68.5 in | Wt 318.3 lb

## 2023-10-02 DIAGNOSIS — J4530 Mild persistent asthma, uncomplicated: Secondary | ICD-10-CM | POA: Diagnosis not present

## 2023-10-02 DIAGNOSIS — G4734 Idiopathic sleep related nonobstructive alveolar hypoventilation: Secondary | ICD-10-CM

## 2023-10-02 DIAGNOSIS — Z8616 Personal history of COVID-19: Secondary | ICD-10-CM

## 2023-10-02 LAB — PULMONARY FUNCTION TEST
DL/VA % pred: 132 %
DL/VA: 5.93 ml/min/mmHg/L
DLCO cor % pred: 98 %
DLCO cor: 27.81 ml/min/mmHg
DLCO unc % pred: 98 %
DLCO unc: 27.81 ml/min/mmHg
FEF 25-75 Post: 1.95 L/s
FEF 25-75 Pre: 3.14 L/s
FEF2575-%Change-Post: -37 %
FEF2575-%Pred-Post: 57 %
FEF2575-%Pred-Pre: 92 %
FEV1-%Change-Post: -9 %
FEV1-%Pred-Post: 65 %
FEV1-%Pred-Pre: 72 %
FEV1-Post: 2.45 L
FEV1-Pre: 2.72 L
FEV1FVC-%Change-Post: -1 %
FEV1FVC-%Pred-Pre: 108 %
FEV6-%Change-Post: -8 %
FEV6-%Pred-Post: 62 %
FEV6-%Pred-Pre: 68 %
FEV6-Post: 2.93 L
FEV6-Pre: 3.21 L
FEV6FVC-%Pred-Post: 103 %
FEV6FVC-%Pred-Pre: 103 %
FVC-%Change-Post: -8 %
FVC-%Pred-Post: 60 %
FVC-%Pred-Pre: 66 %
FVC-Post: 2.93 L
FVC-Pre: 3.21 L
Post FEV1/FVC ratio: 84 %
Post FEV6/FVC ratio: 100 %
Pre FEV1/FVC ratio: 85 %
Pre FEV6/FVC Ratio: 100 %
RV % pred: 109 %
RV: 2.12 L
TLC % pred: 88 %
TLC: 5.88 L

## 2023-10-02 MED ORDER — ALBUTEROL SULFATE HFA 108 (90 BASE) MCG/ACT IN AERS: INHALATION_SPRAY | RESPIRATORY_TRACT | 2 refills | Status: DC

## 2023-10-02 NOTE — Progress Notes (Signed)
 Patient ID: [name], male    DOB: Jul 30, 1976, 48 y.o.   MRN: [id number]  Chief Complaint  Patient presents with   Follow-up    Asthma    Referring provider: Melida Quitter, MD  HPI:  [name] is a 48 y.o. man with history of asthma and extensive ~4 month hospitalization with COVID19 ARDS including 2 months on VV ECMO with tracheostomy s/p decannulation whom we are seeing in consultation at the request of Nadene Rubins Nyoka Cowden, MD for evaluation of respiratory failure.  Prior notes: Patient returns to clinic after several months.  Overall he is doing relatively well.  Breathing seem to slowly be improving.  No longer using daytime oxygen.  Recent sleep study with nocturnal hypoxemia prescribed 2 L nasal cannula.  No need for CPAP or BiPAP.  He continued to have ongoing issues with bilateral foot wounds.  Wearing orthopedic boot today.  Cannot exercise, improve his stamina as he is has to spend most if not all time nonweightbearing.  Endorses 20 pound weight gain.  Frustrated by this.  Want to get back to work.  Discussed my goal to get back to work to, PFTs reviewed with moderate changes.  Frankly results better than anticipated.  HPI initial visit: Multiple notes reviewed in EMR pertaining to prolonged hospitalization. Further details documented there.   His chief complaint is shortness of breath. Present since discharge from hospital late April 2021. Today he reports slow but he thinks gradual improvement over time. Still winded with relatively minimal activity especially inclines/stairs. Severity described as moderate. But during conversation he reports working in yard around house at a slow pace and his wife mentions going for a walk. So his exertional capacity is improving but slowly. 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 He endorses some cough in evening. Not productive then but does produce a small (quarter or  so size) amount of dark yellow to brown sputum. No cough during the day. No chest pain with exertion. Feels symptoms improve with once daily Symbicort. Uses albuterol in the evenings multiple times a week. Unsure if it helps, doesn't see much benefit. Thinks it may be habit. Wife agrees. Mild to minimal LE edema managed with lasix.   Has critical illness myopathy. Starting outpatient PT soon per report. He is being treated for foot ulcers as a result of prolonged immobilization in ICU. Planning what sounds like surgical debridement in the future  07/05/23 Patient was last seen in 2022 with Dr. Judeth Horn. He reports he is not consistent with his Markus Daft and is followed by Dr. Dellis Anes with [facility]. He has some cough and shortness of breath off the inhaler but improved whenever he is compliant. Previously on Trelegy and did well with this but insurance did not cover. Dr. Weyman Croon with [facility] to perform a left plantar surface wound prep and myriad placement on patient. Length of surgery 1 hour 15 min under general anesthesia.   10/02/23 Since our last visit he is using Breztri daily and albuterol two puffs nightly. Short of breath with activity including with upper body movements for 5 minutes. Planning for March 11 for left foot for plantar fascitis surgery.  PMH: asthma, obesity Surgical History: ECMO cannulation, shoulder debridement 2019 Family History: asthma in brother, DM and HTN in father Social History: Lives in [place] with wife, son, worked in [employer] prior to [employer], never  smoker   Questionaires / Pulmonary Flowsheets:   ACT:  Asthma Control Test ACT Total Score  01/26/2023  1:00 PM 14  12/15/2020  9:00 AM 17  10/28/2020  3:36 PM 17    Imaging: Personally reviewed with interpretation as follows: Most recent CXR 10/27/2019: Low lung volumes, bilateral diffuse infiltrates most dense in center of chest Most recent CT Chest 06/12/21: Resolved  airspace disease. Scarring with bronchiectasis and architectural distortion bilaterally  10/02/23 FVC 2.93 (60%) FEV1 2.45 (65%) Ratio 84  TLC 88% DLCO 98% Interpretation: Reduced FVC and FEV1. No obstructive or restrictive defect   Lab Results: Personally Reviewed, Eos 100 CBC    Component Value Date/Time   WBC 6.6 09/21/2022 1243   WBC 7.7 08/31/2020 0000   RBC 6.06 (H) 09/21/2022 1243   RBC 5.66 08/31/2020 0000   HGB 16.0 09/21/2022 1243   HCT 51.0 09/21/2022 1243   PLT 132 (L) 09/21/2022 1243   MCV 84 09/21/2022 1243   MCH 26.4 (L) 09/21/2022 1243   MCH 28.3 08/31/2020 0000   MCHC 31.4 (L) 09/21/2022 1243   MCHC 33.8 08/31/2020 0000   RDW 12.7 09/21/2022 1243   LYMPHSABS 2.0 09/21/2022 1243   MONOABS 0.7 12/02/2019 0628   EOSABS 0.1 09/21/2022 1243   BASOSABS 0.1 09/21/2022 1243    BMET    Component Value Date/Time   NA 145 (H) 09/21/2022 1243   K 4.3 09/21/2022 1243   CL 102 09/21/2022 1243   CO2 22 09/21/2022 1243   GLUCOSE 99 09/21/2022 1243   GLUCOSE 129 (H) 08/31/2020 0000   BUN 17 09/21/2022 1243   CREATININE 1.10 08/08/2023 1002   CREATININE 1.10 08/31/2020 0000   CALCIUM 9.1 09/21/2022 1243   GFRNONAA 80 08/31/2020 0000   GFRAA 93 08/31/2020 0000   Specialty Problems       Pulmonary Problems   Allergic rhinoconjunctivitis   Moderate persistent asthma   Pneumonia   covid      Diffuse alveolar damage (HCC)   COVID-19 long hauler manifesting chronic dyspnea   Encounter for attention to tracheostomy (HCC)   Obstructive sleep apnea   Nocturnal hypoxemia   SOBOE (shortness of breath on exertion)   Asthma   Dyspnea    No Known Allergies  Immunization History  Administered Date(s) Administered   Influenza, Quadrivalent, Recombinant, Inj, Pf 05/13/2020   PFIZER(Purple Top)SARS-COV-2 Vaccination 02/18/2020, 03/10/2020    Past Medical History:  Diagnosis Date   Abnormal gait 11/12/2009   Qualifier: Diagnosis of   By: Pearletha Forge MD, Shane          Acute osteomyelitis (HCC) 08/31/2020   Advanced care planning/counseling discussion    Advanced directives, counseling/discussion    Allergic rhinoconjunctivitis 07/21/2015   Anginal pain (HCC)    with covid   Anxiety    Asthma    Chest tube in place    Chronic left-sided low back pain with left-sided sciatica 09/19/2017   COVID-19 long hauler manifesting chronic dyspnea 09/15/2020   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   Critical illness polyneuropathy (HCC) 09/15/2020   Depression    Diffuse alveolar damage (HCC) 03/09/2020   Dyspnea    Encounter for attention to tracheostomy (HCC) 09/15/2020   Encounter for therapeutic drug monitoring 09/29/2020   Epigastric pain    Eschar of foot    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   Fever    GERD (gastroesophageal reflux disease)    Goals of care, counseling/discussion    Gram positive  bacterial infection    Headache    History of COVID-19    History of kidney stones    LEFT URETERAL STONE   HTN (hypertension)    Hyperlipidemia 07/18/2023   Impaired sensation to light touch 04/15/2020   Insomnia due to other mental disorder 12/06/2021   Left foot drop 09/06/2021   Leukopenia    Long COVID 09/09/2020   Moderate persistent asthma 07/21/2015   Moderate protein-calorie malnutrition (HCC) 09/15/2020   Mood disorder (HCC) 09/07/2022   Morbid obesity (HCC) with starting BMI 46 09/15/2020   MSSA (methicillin susceptible Staphylococcus aureus) infection 08/31/2020   Need for emotional support    Nocturnal hypoxemia 09/07/2022   Obstructive sleep apnea 01/11/2022   Other fatigue 09/07/2022   Palliative care by specialist    Personal history of ECMO    PES PLANUS 10/08/2009   Qualifier: Diagnosis of   By: Pearletha Forge MD, Vincenza Hews      Replacing diagnoses that were inactivated after the 11/07/22 regulatory import     Pneumonia 07/2019   covid   Prediabetes    Preop cardiovascular exam    Pressure  injury of skin 08/22/2019   Primary hypertension 04/17/2017   Pyogenic inflammation of bone (HCC) 09/09/2020   Reactive depression 07/01/2020   Septic arthritis of right acromioclavicular joint (HCC) 09/29/2017   SOBOE (shortness of breath on exertion)    Subcutaneous crepitus    TARSAL TUNNEL SYNDROME, LEFT 10/08/2009   Qualifier: Diagnosis of   By: Pearletha Forge MD, Shane         Thrombocytopenia (HCC)    Type 2 diabetes mellitus without complication, without long-term current use of insulin (HCC) 10/18/2022   Ulcer of left foot (HCC) 09/07/2022   Vitamin D deficiency 10/18/2022   Wheelchair dependence 06/04/2021     Outpatient Encounter Medications as of 10/02/2023  Medication Sig   albuterol (VENTOLIN HFA) 108 (90 Base) MCG/ACT inhaler TAKE 2 PUFFS BY MOUTH EVERY 4 HOURS AS NEEDED   amLODipine (NORVASC) 5 MG tablet Take 5 mg by mouth daily.   Budeson-Glycopyrrol-Formoterol (BREZTRI AEROSPHERE) 160-9-4.8 MCG/ACT AERO Inhale 2 puffs into the lungs in the morning and at bedtime.   cetirizine (ZYRTEC) 10 MG tablet Take 10 mg by mouth daily.   DULoxetine (CYMBALTA) 60 MG capsule 1 capsule Orally Once a day for 90   HYDROcodone-acetaminophen (NORCO) 5-325 MG tablet Take 1 tablet by mouth 2 (two) times daily as needed for moderate pain (pain score 4-6).   ipratropium (ATROVENT) 0.06 % nasal spray Place 2 sprays into both nostrils 3 (three) times daily.   lamoTRIgine (LAMICTAL) 100 MG tablet Take 1 tablet (100 mg total) by mouth daily.   metoprolol tartrate (LOPRESSOR) 25 MG tablet Take 25 mg by mouth 2 (two) times daily.   montelukast (SINGULAIR) 10 MG tablet Take 1 tablet (10 mg total) by mouth daily.   OXYGEN Inhale 2 L into the lungs at bedtime.   pantoprazole (PROTONIX) 40 MG tablet Take 1 tablet by mouth daily.   sildenafil (VIAGRA) 50 MG tablet 1 tablet as needed Orally Once a day   traZODone (DESYREL) 100 MG tablet Take 2 tablets (200 mg total) by mouth at bedtime.   triamcinolone  (NASACORT) 55 MCG/ACT AERO nasal inhaler Place 2 sprays into the nose daily.   Vitamin D, Ergocalciferol, (DRISDOL) 1.25 MG (50000 UNIT) CAPS capsule TAKE 1 CAPSULE (50,000 UNITS TOTAL) BY MOUTH EVERY 7 (SEVEN) DAYS   No facility-administered encounter medications on file as of 10/02/2023.  Review of Systems  Review of Systems  Constitutional:  Negative for chills, diaphoresis and fever.  HENT:  Negative for congestion.   Respiratory:  Positive for shortness of breath. Negative for cough and wheezing.   Cardiovascular:  Negative for chest pain, palpitations and leg swelling.    N/a  Physical Exam  BP 126/84   Pulse 73   Ht 5' 8.5" (1.74 m)   Wt (!) 318 lb 4.8 oz (144.4 kg)   SpO2 94%   BMI 47.69 kg/m   Wt Readings from Last 5 Encounters:  10/02/23 (!) 318 lb 4.8 oz (144.4 kg)  10/02/23 (!) 318 lb 4.8 oz (144.4 kg)  09/22/23 (!) 317 lb 9.6 oz (144.1 kg)  08/25/23 (!) 316 lb 4.8 oz (143.5 kg)  08/07/23 (!) 315 lb 8 oz (143.1 kg)    BMI Readings from Last 5 Encounters:  10/02/23 47.69 kg/m  10/02/23 47.69 kg/m  09/22/23 47.59 kg/m  08/25/23 47.39 kg/m  08/07/23 47.27 kg/m   Physical Exam: General: Well-appearing, no acute distress HENT: [place], AT Eyes: EOMI, no scleral icterus Respiratory: Clear to auscultation bilaterally.  No crackles, wheezing or rales Cardiovascular: RRR, -M/R/G, no JVD Extremities:-Edema,-tenderness Neuro: AAO x4, CNII-XII grossly intact Psych: Normal mood, normal affect  Assessment & Plan:   48 year old male with asthma, hx ARDS secondary to covid requiring VV ECMO s/p tracheostomy s/p decannulation who presents for follow-up. Inconsistent with bronchodilator use with mild breakthrough symptoms. Need to optimize respiratory status prior to surgery. Discussed clinical course and management of asthma including bronchodilator regimen, preventive care and action plan for exacerbation.  Mild persistent asthma - mildly symptomatic with  inconsistent inhaler use Post-ARDS 2/2 covid --RESTART Breztri as prescribed. TWO puffs in the morning and evening --CONTINUE Albuterol TWO puffs AS NEEDED for shortness of breath or wheezing  Suspected pulmonary hypertension likely related to chronic lung disease -  10/01/20 Split night no OSA or CSA. Nocturnal hypoxemia requiring 2L nightly --Reviewed echocardiogram. Normal EF and right side of heart --Reviewed pulmonary function test. Reduced FVC and FEV1 but overall no obstructive or restrictive defect --ORDER overnight oximetry on room air  Peri-operative Assessment of Pulmonary Risk for Non-Thoracic Surgery:  ARISCAT score: LOW RISK 1.6% risk of in-hospital post-op pulmonary complications (composite including respiratory failure, respiratory infection, pleural effusion, atelectasis, pneumothorax, bronchospasm, aspiration pneumonitis)  For Mr. Gilkeson, risk of perioperative pulmonary complications is increased by:  [ ]  Age greater than 65 years  [ ]  COPD  [ ]  Serum albumin <3.5  [ ]  Smoking  [ ]  Obstructive sleep apnea  [ ]  NYHA Class II Pulmonary Hypertension  Respiratory complications generally occur in 1% of ASA Class I patients, 5% of ASA Class II and 10% of ASA Class III-IV patients These complications rarely result in mortality and include postoperative pneumonia, atelectasis, pulmonary embolism, ARDS and increased time requiring postoperative mechanical ventilation.  Overall, I recommend proceeding with the surgery if the risk for respiratory complications are outweighed by the potential benefits. This will need to be discussed between the patient and surgeon.  To reduce risks of respiratory complications, I recommend: --Pre- and post-operative incentive spirometry performed frequently while awake --Avoiding use of pancuronium during anesthesia.  I have discussed the risk factors and recommendations above with the patient.   No follow-ups on file.  I have spent a total  time of 35-minutes on the day of the appointment including chart review, data review, collecting history, coordinating care and discussing medical diagnosis and plan  with the patient/family. Past medical history, allergies, medications were reviewed. Pertinent imaging, labs and tests included in this note have been reviewed and interpreted independently by me.  Kwynn Schlotter Mechele Collin, MD 10/02/2023

## 2023-10-02 NOTE — Patient Instructions (Signed)
 Full PFT Performed Today

## 2023-10-02 NOTE — Patient Instructions (Signed)
  Mild persistent asthma - mildly symptomatic with inconsistent inhaler use Post-ARDS 2/2 covid --RESTART Breztri as prescribed. TWO puffs in the morning and evening --CONTINUE Albuterol TWO puffs AS NEEDED for shortness of breath or wheezing  Suspected pulmonary hypertension likely related to chronic lung disease  10/01/20 Split night no OSA or CSA. Nocturnal hypoxemia requiring 2L nightly --Reviewed echocardiogram. Normal EF and right side of heart --Reviewed pulmonary function test. Reduced FVC and FEV1 but overall no obstructive or restrictive defect --ORDER overnight oximetry on room air  Peri-operative Assessment of Pulmonary Risk for Non-Thoracic Surgery:  ARISCAT score: LOW RISK 1.6% risk of in-hospital post-op pulmonary complications (composite including respiratory failure, respiratory infection, pleural effusion, atelectasis, pneumothorax, bronchospasm, aspiration pneumonitis)  For Mr. Isenberg, risk of perioperative pulmonary complications is increased by:  [ ]  Age greater than 65 years  [ ]  COPD  [ ]  Serum albumin <3.5  [ ]  Smoking  [ ]  Obstructive sleep apnea  [ ]  NYHA Class II Pulmonary Hypertension  Respiratory complications generally occur in 1% of ASA Class I patients, 5% of ASA Class II and 10% of ASA Class III-IV patients These complications rarely result in mortality and include postoperative pneumonia, atelectasis, pulmonary embolism, ARDS and increased time requiring postoperative mechanical ventilation.  Overall, I recommend proceeding with the surgery if the risk for respiratory complications are outweighed by the potential benefits. This will need to be discussed between the patient and surgeon.  To reduce risks of respiratory complications, I recommend: --Pre- and post-operative incentive spirometry performed frequently while awake --Avoiding use of pancuronium during anesthesia.  I have discussed the risk factors and recommendations above with the  patient.

## 2023-10-02 NOTE — Progress Notes (Signed)
 Full PFT Performed Today

## 2023-10-04 NOTE — Progress Notes (Deleted)
 Patient ID: [name], male    DOB: Mar 11, 1976, 48 y.o.   MRN: [id number]  No chief complaint on file.   No diagnosis found.   History of Present Illness: [name] is a 48 y.o.  male  with a history of bilateral foot wounds.  He presents for preoperative evaluation for upcoming procedure, left plantar surface wound debridement with placement of matrix, scheduled for 10/17/2023 with Dr.  Ladona Ridgel .  The patient has not had problems with anesthesia.  Denies any personal or family history of blood clots or clotting disorder.  He denies any nicotine use disorder or use of anticoagulation.  Summary of Previous Visit: Patient met with Dr. Ladona Ridgel for initial consult 06/07/2023.  At that time, discussed his bilateral foot ulcerations s/p treatments from podiatry and wound clinic.  Right foot had nearly healed entirely, but left foot wound remained 3 x 5.5 cm.  It was suspected that he would ultimately require a fasciocutaneous free flap over the heel, but patient expressed that he would first prefer a less invasive option.  Discussed the plans for myriad placement.  She then had a preoperative visit 06/20/2023 at which time risks of surgery were discussed, but patient was ultimately canceled due to need for surgical clearances.  Cardiac clearance received by [place] NP at [facility] 09/25/2023.  Acceptable risk for planned procedure without further cardiovascular testing needed.  Perioperative assessment of pulmonary risk for nonthoracic surgery was deemed low risk 1.6% per his pulmonologist.  Job: ***  PMH Significant for: ***Asthma in setting of diffuse alveolar damage, suspected pulmonary hypertension likely related to chronic lung disease, T2DM with most recent A1c 6.1%, chronic pain syndrome, right foot osteomyelitis, Low risk nuclear stress testing and echocardiogram and 08/2023.   Past Medical History: Allergies: No Known Allergies  Current Medications:  Current  Outpatient Medications:    albuterol (VENTOLIN HFA) 108 (90 Base) MCG/ACT inhaler, TAKE 2 PUFFS BY MOUTH EVERY 4 HOURS AS NEEDED, Disp: 18 each, Rfl: 2   amLODipine (NORVASC) 5 MG tablet, Take 5 mg by mouth daily., Disp: , Rfl:    Budeson-Glycopyrrol-Formoterol (BREZTRI AEROSPHERE) 160-9-4.8 MCG/ACT AERO, Inhale 2 puffs into the lungs in the morning and at bedtime., Disp: 3 each, Rfl: 1   cetirizine (ZYRTEC) 10 MG tablet, Take 10 mg by mouth daily., Disp: , Rfl:    DULoxetine (CYMBALTA) 60 MG capsule, 1 capsule Orally Once a day for 90, Disp: 90 capsule, Rfl: 1   HYDROcodone-acetaminophen (NORCO) 5-325 MG tablet, Take 1 tablet by mouth 2 (two) times daily as needed for moderate pain (pain score 4-6)., Disp: 60 tablet, Rfl: 0   ipratropium (ATROVENT) 0.06 % nasal spray, Place 2 sprays into both nostrils 3 (three) times daily., Disp: 15 mL, Rfl: 5   lamoTRIgine (LAMICTAL) 100 MG tablet, Take 1 tablet (100 mg total) by mouth daily., Disp: 90 tablet, Rfl: 1   metoprolol tartrate (LOPRESSOR) 25 MG tablet, Take 25 mg by mouth 2 (two) times daily., Disp: , Rfl:    montelukast (SINGULAIR) 10 MG tablet, Take 1 tablet (10 mg total) by mouth daily., Disp: 90 tablet, Rfl: 1   OXYGEN, Inhale 2 L into the lungs at bedtime., Disp: , Rfl:    pantoprazole (PROTONIX) 40 MG tablet, Take 1 tablet by mouth daily., Disp: , Rfl:    sildenafil (VIAGRA) 50 MG tablet, 1 tablet as needed Orally Once a day, Disp: , Rfl:    traZODone (DESYREL) 100 MG tablet, Take  2 tablets (200 mg total) by mouth at bedtime., Disp: 180 tablet, Rfl: 3   triamcinolone (NASACORT) 55 MCG/ACT AERO nasal inhaler, Place 2 sprays into the nose daily., Disp: , Rfl:    Vitamin D, Ergocalciferol, (DRISDOL) 1.25 MG (50000 UNIT) CAPS capsule, TAKE 1 CAPSULE (50,000 UNITS TOTAL) BY MOUTH EVERY 7 (SEVEN) DAYS, Disp: 5 capsule, Rfl: 0  Past Medical Problems: Past Medical History:  Diagnosis Date   Abnormal gait 11/12/2009   Qualifier: Diagnosis of   By:  Pearletha Forge MD, Shane         Acute osteomyelitis (HCC) 08/31/2020   Advanced care planning/counseling discussion    Advanced directives, counseling/discussion    Allergic rhinoconjunctivitis 07/21/2015   Anginal pain (HCC)    with covid   Anxiety    Asthma    Chest tube in place    Chronic left-sided low back pain with left-sided sciatica 09/19/2017   COVID-19 long hauler manifesting chronic dyspnea 09/15/2020   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   Critical illness polyneuropathy (HCC) 09/15/2020   Depression    Diffuse alveolar damage (HCC) 03/09/2020   Dyspnea    Encounter for attention to tracheostomy (HCC) 09/15/2020   Encounter for therapeutic drug monitoring 09/29/2020   Epigastric pain    Eschar of foot    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   Fever    GERD (gastroesophageal reflux disease)    Goals of care, counseling/discussion    Gram positive bacterial infection    Headache    History of COVID-19    History of kidney stones    LEFT URETERAL STONE   HTN (hypertension)    Hyperlipidemia 07/18/2023   Impaired sensation to light touch 04/15/2020   Insomnia due to other mental disorder 12/06/2021   Left foot drop 09/06/2021   Leukopenia    Long COVID 09/09/2020   Moderate persistent asthma 07/21/2015   Moderate protein-calorie malnutrition (HCC) 09/15/2020   Mood disorder (HCC) 09/07/2022   Morbid obesity (HCC) with starting BMI 46 09/15/2020   MSSA (methicillin susceptible Staphylococcus aureus) infection 08/31/2020   Need for emotional support    Nocturnal hypoxemia 09/07/2022   Obstructive sleep apnea 01/11/2022   Other fatigue 09/07/2022   Palliative care by specialist    Personal history of ECMO    PES PLANUS 10/08/2009   Qualifier: Diagnosis of   By: Pearletha Forge MD, Vincenza Hews      Replacing diagnoses that were inactivated after the 11/07/22 regulatory import     Pneumonia 07/2019   covid   Prediabetes    Preop cardiovascular  exam    Pressure injury of skin 08/22/2019   Primary hypertension 04/17/2017   Pyogenic inflammation of bone (HCC) 09/09/2020   Reactive depression 07/01/2020   Septic arthritis of right acromioclavicular joint (HCC) 09/29/2017   SOBOE (shortness of breath on exertion)    Subcutaneous crepitus    TARSAL TUNNEL SYNDROME, LEFT 10/08/2009   Qualifier: Diagnosis of   By: Pearletha Forge MD, Shane         Thrombocytopenia (HCC)    Type 2 diabetes mellitus without complication, without long-term current use of insulin (HCC) 10/18/2022   Ulcer of left foot (HCC) 09/07/2022   Vitamin D deficiency 10/18/2022   Wheelchair dependence 06/04/2021    Past Surgical History: Past Surgical History:  Procedure Laterality Date   CANNULATION FOR ECMO (EXTRACORPOREAL MEMBRANE OXYGENATION) N/A 08/28/2019   Procedure: CANNULATION FOR VV ECMO (EXTRACORPOREAL MEMBRANE OXYGENATION);  Surgeon: Kerin Perna, MD;  Location:  [facility] OR;  Service: Open Heart Surgery;  Laterality: N/A;  CRESCENT CANNULA   CANNULATION FOR ECMO (EXTRACORPOREAL MEMBRANE OXYGENATION) N/A 09/10/2019   Procedure: CANNULATION FOR ECMO (EXTRACORPOREAL MEMBRANE OXYGENATION) PUTTING IN CRESCENT 32FR CANNULA  AND REMOVING GROING CANNULATION;  Surgeon: Linden Dolin, MD;  Location: [facility] OR;  Service: Open Heart Surgery;  Laterality: N/A;  PUTTING IN CRESCENT/REMOVING GROIN CANNULATION   CYSTOSCOPY/URETEROSCOPY/HOLMIUM LASER/STENT PLACEMENT Left 04/18/2019   Procedure: LEFT URETEROSCOPY/HOLMIUM LASER/STENT PLACEMENT;  Surgeon: Crist Fat, MD;  Location[facility];  Service: Urology;  Laterality: Left;   CYSTOSCOPY/URETEROSCOPY/HOLMIUM LASER/STENT PLACEMENT Left 05/02/2019   Procedure: CYSTOSCOPY/URETEROSCOPY/HOLMIUM LASER/STENT EXCHANGE;  Surgeon: Crist Fat, MD;  Location: [facility];  Service: Urology;  Laterality: Left;   ECMO CANNULATION N/A 08/03/2019   Procedure: ECMO CANNULATION;  Surgeon: Linden Dolin, MD;   Location: [facility];  Service: Cardiovascular;  Laterality: N/A;   ESOPHAGOGASTRODUODENOSCOPY N/A 09/11/2019   Procedure: ESOPHAGOGASTRODUODENOSCOPY (EGD);  Surgeon: Linden Dolin, MD;  Location: [facility] OR;  Service: Thoracic;  Laterality: N/A;   GRAFT APPLICATION Bilateral 03/27/2020   Procedure: APPLICATION OF SKIN GRAFT BILATERAL FEET;  Surgeon: Felecia Shelling, DPM;  Location: [facility];  Service: Podiatry;  Laterality: Bilateral;   IR REPLC GASTRO/COLONIC TUBE PERCUT W/FLUORO  10/14/2019   IRRIGATION AND DEBRIDEMENT SHOULDER Right 09/29/2017   Procedure: IRRIGATION AND DEBRIDEMENT SHOULDER;  Surgeon: Tarry Kos, MD;  Location: [facility] OR;  Service: Orthopedics;  Laterality: Right;   LUMBAR DISC SURGERY  2002   NASAL ENDOSCOPY WITH EPISTAXIS CONTROL N/A 08/31/2019   Procedure: NASAL ENDOSCOPY WITH EPISTAXIS CONTROL WITH CAUTERIZATION;  Surgeon: Christia Reading, MD;  Location: [facility] OR;  Service: ENT;  Laterality: N/A;   PORTACATH PLACEMENT N/A 09/11/2019   Procedure: PEG TUBE INSERTION - BEDSIDE;  Surgeon: Linden Dolin, MD;  Location: [facility] OR;  Service: Thoracic;  Laterality: N/A;   TEE WITHOUT CARDIOVERSION N/A 08/28/2019   Procedure: TRANSESOPHAGEAL ECHOCARDIOGRAM (TEE);  Surgeon: Donata Clay, Theron Arista, MD;  Location: [facility] OR;  Service: Open Heart Surgery;  Laterality: N/A;   TEE WITHOUT CARDIOVERSION N/A 09/10/2019   Procedure: TRANSESOPHAGEAL ECHOCARDIOGRAM (TEE);  Surgeon: Linden Dolin, MD;  Location: [facility] OR;  Service: Open Heart Surgery;  Laterality: N/A;   WOUND DEBRIDEMENT Bilateral 03/27/2020   Procedure: EXCISIONAL DEBRIDEMENT OF ULCERS BILATERAL FEET;  Surgeon: Felecia Shelling, DPM;  Location: [facility];  Service: Podiatry;  Laterality: Bilateral;    Social History: Social History   Socioeconomic History   Marital status: Married    Spouse name: Not on file   Number of children: 1   Years of education: Not on file   Highest education level: Not on file  Occupational History    Occupation: delivery driver   Occupation: Stay at home spouse  Tobacco Use   Smoking status: Never   Smokeless tobacco: Never  Vaping Use   Vaping status: Never Used  Substance and Sexual Activity   Alcohol use: Yes    Comment: rarely 2-3 times a year   Drug use: No   Sexual activity: Yes    Partners: Female  Other Topics Concern   Not on file  Social History Narrative   Not on file   Social Drivers of Health   Financial Resource Strain: Not on file  Food Insecurity: Not on file  Transportation Needs: Not on file  Physical Activity: Not on file  Stress: Not on file  Social Connections: Not on file  Intimate  Partner Violence: Not on file    Family History: Family History  Problem Relation Age of Onset   Migraines Mother    GER disease Mother    Heart disease Father    Hyperlipidemia Father    Diabetes Father    Hypertension Father    Sleep apnea Father    Obesity Father    Asthma Brother    Pancreatic cancer Maternal Grandmother    Heart disease Maternal Grandfather    Diabetes Paternal Grandmother    Hypertension Paternal Grandmother     Review of Systems: ROS  Physical Exam: Vital Signs There were no vitals taken for this visit.  Physical Exam *** Constitutional:      General: Not in acute distress.    Appearance: Normal appearance. Not ill-appearing.  HENT:     Head: Normocephalic and atraumatic.  Eyes:     Pupils: Pupils are equal, round. Cardiovascular:     Rate and Rhythm: Normal rate.    Pulses: Normal pulses.  Pulmonary:     Effort: No respiratory distress or increased work of breathing.  Speaks in full sentences. Abdominal:     General: Abdomen is flat. No distension.   Musculoskeletal: Normal range of motion. No lower extremity swelling or edema. No varicosities. *** Skin:    General: Skin is warm and dry.     Findings: No erythema or rash.  Neurological:     Mental Status: Alert and oriented to person, place, and time.  Psychiatric:         Mood and Affect: Mood normal.        Behavior: Behavior normal.    Assessment/Plan: The patient is scheduled for left plantar surface wound debridement with placement of matrix with Dr.  Ladona Ridgel .  Risks, benefits, and alternatives of procedure discussed, questions answered and consent obtained.    Smoking Status: ***; Counseling Given? ***  Caprini Score: ***; Risk Factors include: ***, BMI *** 25, and length of planned surgery. Recommendation for mechanical *** pharmacological prophylaxis. Encourage early ambulation.   Pictures obtained: ***  Post-op Rx sent to pharmacy: ***  Patient was provided with the General Surgical Risk consent document and Pain Medication Agreement prior to their appointment.  They had adequate time to read through the risk consent documents and Pain Medication Agreement. We also discussed them in person together during this preop appointment. All of their questions were answered to their satisfaction.  Recommended calling if they have any further questions.  Risk consent form and Pain Medication Agreement to be scanned into patient's chart.   Electronically signed by: Evelena Leyden, PA-C 10/04/2023 3:49 PM

## 2023-10-05 ENCOUNTER — Encounter: Payer: PPO | Admitting: Physician Assistant

## 2023-10-05 NOTE — H&P (View-Only) (Signed)
 Patient ID: [name], male    DOB: 30-Nov-1975, 48 y.o.   MRN: [id number]  Pre-Operative Exam   Chronic left foot wound   History of Present Illness: [name] is a 48 y.o.  male  with a history of bilateral foot wounds.  He presents for preoperative evaluation for upcoming procedure, left plantar surface wound debridement with placement of matrix, scheduled for 10/17/2023 with Dr. Ladona Ridgel.  The patient has not had problems with anesthesia.  Denies any personal or family history of blood clots or clotting disorder.  He denies any nicotine use disorder or use of anticoagulation.  He is accompanied by spouse at bedside.  Reports that his wounds are subsequent to COVID-19 hospitalization in 2020.  He is seeing wound care for regular debridement and they have been doing a good job, but persistent wounds nonetheless.  He is wheelchair-bound.  Summary of Previous Visit: Patient met with Dr. Ladona Ridgel for initial consult 06/07/2023.  At that time, discussed his bilateral foot ulcerations s/p treatments from podiatry and wound clinic.  Right foot had nearly healed entirely, but left foot wound remained 3 x 5.5 cm.  It was suspected that he would ultimately require a fasciocutaneous free flap over the heel, but patient expressed that he would first prefer a less invasive option.  Discussed the plans for myriad placement.  She then had a preoperative visit 06/20/2023 at which time risks of surgery were discussed, but patient was ultimately canceled due to need for surgical clearances.  Cardiac clearance received by [place] NP at [facility] 09/25/2023.  Acceptable risk for planned procedure without further cardiovascular testing needed.  Perioperative assessment of pulmonary risk for nonthoracic surgery was deemed low risk 1.6% per his pulmonologist.  Job: Disability.  PMH Significant for: Asthma in setting of diffuse alveolar damage, suspected pulmonary hypertension likely related to chronic  lung disease, T2DM with most recent A1c 6.1%, chronic pain but has not filled prescription for narcotic since November 2024, right foot osteomyelitis, morbid obesity, wheelchair bound.  Low risk nuclear stress testing and echocardiogram and 08/2023.   Past Medical History: Allergies: No Known Allergies  Current Medications:  Current Outpatient Medications:    albuterol (VENTOLIN HFA) 108 (90 Base) MCG/ACT inhaler, TAKE 2 PUFFS BY MOUTH EVERY 4 HOURS AS NEEDED, Disp: 18 each, Rfl: 2   amLODipine (NORVASC) 5 MG tablet, Take 5 mg by mouth daily., Disp: , Rfl:    Budeson-Glycopyrrol-Formoterol (BREZTRI AEROSPHERE) 160-9-4.8 MCG/ACT AERO, Inhale 2 puffs into the lungs in the morning and at bedtime., Disp: 3 each, Rfl: 1   cetirizine (ZYRTEC) 10 MG tablet, Take 10 mg by mouth daily., Disp: , Rfl:    DULoxetine (CYMBALTA) 60 MG capsule, 1 capsule Orally Once a day for 90, Disp: 90 capsule, Rfl: 1   HYDROcodone-acetaminophen (NORCO) 5-325 MG tablet, Take 1 tablet by mouth 2 (two) times daily as needed for moderate pain (pain score 4-6)., Disp: 60 tablet, Rfl: 0   ipratropium (ATROVENT) 0.06 % nasal spray, Place 2 sprays into both nostrils 3 (three) times daily., Disp: 15 mL, Rfl: 5   lamoTRIgine (LAMICTAL) 100 MG tablet, Take 1 tablet (100 mg total) by mouth daily., Disp: 90 tablet, Rfl: 1   metoprolol tartrate (LOPRESSOR) 25 MG tablet, Take 25 mg by mouth 2 (two) times daily., Disp: , Rfl:    montelukast (SINGULAIR) 10 MG tablet, Take 1 tablet (10 mg total) by mouth daily., Disp: 90 tablet, Rfl: 1   OXYGEN, Inhale  2 L into the lungs at bedtime., Disp: , Rfl:    pantoprazole (PROTONIX) 40 MG tablet, Take 1 tablet by mouth daily., Disp: , Rfl:    sildenafil (VIAGRA) 50 MG tablet, 1 tablet as needed Orally Once a day, Disp: , Rfl:    traZODone (DESYREL) 100 MG tablet, Take 2 tablets (200 mg total) by mouth at bedtime., Disp: 180 tablet, Rfl: 3   triamcinolone (NASACORT) 55 MCG/ACT AERO nasal inhaler,  Place 2 sprays into the nose daily., Disp: , Rfl:    Vitamin D, Ergocalciferol, (DRISDOL) 1.25 MG (50000 UNIT) CAPS capsule, TAKE 1 CAPSULE (50,000 UNITS TOTAL) BY MOUTH EVERY 7 (SEVEN) DAYS, Disp: 5 capsule, Rfl: 0  Past Medical Problems: 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   Critical illness polyneuropathy (HCC) 09/15/2020   Depression    Diffuse alveolar damage (HCC) 03/09/2020   Dyspnea    Encounter for attention to tracheostomy (HCC) 09/15/2020   Encounter for therapeutic drug monitoring 09/29/2020   Epigastric pain    Eschar of foot    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   Fever    GERD (gastroesophageal reflux disease)    Goals of care, counseling/discussion    Gram positive bacterial infection    Headache    History of COVID-19    History of kidney stones    LEFT URETERAL STONE   HTN (hypertension)    Hyperlipidemia 07/18/2023   Impaired sensation to light touch 04/15/2020   Insomnia due to other mental disorder 12/06/2021   Left foot drop 09/06/2021   Leukopenia    Long COVID 09/09/2020   Moderate persistent asthma 07/21/2015   Moderate protein-calorie malnutrition (HCC) 09/15/2020   Mood disorder (HCC) 09/07/2022   Morbid obesity (HCC) with starting BMI 46 09/15/2020   MSSA (methicillin susceptible Staphylococcus aureus) infection 08/31/2020   Need for emotional support    Nocturnal hypoxemia 09/07/2022   Obstructive  sleep apnea 01/11/2022   Other fatigue 09/07/2022   Palliative care by specialist    Personal history of ECMO    PES PLANUS 10/08/2009   Qualifier: Diagnosis of   By: Pearletha Forge MD, Vincenza Hews      Replacing diagnoses that were inactivated after the 11/07/22 regulatory import     Pneumonia 07/2019   covid   Prediabetes    Preop cardiovascular exam    Pressure injury of skin 08/22/2019   Primary hypertension 04/17/2017   Pyogenic inflammation of bone (HCC) 09/09/2020   Reactive depression 07/01/2020   Septic arthritis of right acromioclavicular joint (HCC) 09/29/2017   SOBOE (shortness of breath on exertion)    Subcutaneous crepitus    TARSAL TUNNEL SYNDROME, LEFT 10/08/2009   Qualifier: Diagnosis of   By: Pearletha Forge MD, Shane         Thrombocytopenia (HCC)    Type 2 diabetes mellitus without complication, without long-term current use of insulin (HCC) 10/18/2022   Ulcer of left  foot (HCC) 09/07/2022   Vitamin D deficiency 10/18/2022   Wheelchair dependence 06/04/2021    Past Surgical History: Past Surgical History:  Procedure Laterality Date   CANNULATION FOR ECMO (EXTRACORPOREAL MEMBRANE OXYGENATION) N/A 08/28/2019   Procedure: CANNULATION FOR VV ECMO (EXTRACORPOREAL MEMBRANE OXYGENATION);  Surgeon: Donata Clay, Theron Arista, MD;  Location: [facility] OR;  Service: Open Heart Surgery;  Laterality: N/A;  CRESCENT CANNULA   CANNULATION FOR ECMO (EXTRACORPOREAL MEMBRANE OXYGENATION) N/A 09/10/2019   Procedure: CANNULATION FOR ECMO (EXTRACORPOREAL MEMBRANE OXYGENATION) PUTTING IN CRESCENT 32FR CANNULA  AND REMOVING GROING CANNULATION;  Surgeon: Linden Dolin, MD;  Location: [facility] OR;  Service: Open Heart Surgery;  Laterality: N/A;  PUTTING IN CRESCENT/REMOVING GROIN CANNULATION   CYSTOSCOPY/URETEROSCOPY/HOLMIUM LASER/STENT PLACEMENT Left 04/18/2019   Procedure: LEFT URETEROSCOPY/HOLMIUM LASER/STENT PLACEMENT;  Surgeon: Crist Fat, MD;  [facility];  Service: Urology;  Laterality:  Left;   CYSTOSCOPY/URETEROSCOPY/HOLMIUM LASER/STENT PLACEMENT Left 05/02/2019   Procedure: CYSTOSCOPY/URETEROSCOPY/HOLMIUM LASER/STENT EXCHANGE;  Surgeon: Crist Fat, MD;  Location: [facility];  Service: Urology;  Laterality: Left;   ECMO CANNULATION N/A 08/03/2019   Procedure: ECMO CANNULATION;  Surgeon: Linden Dolin, MD;  Location: [facility];  Service: Cardiovascular;  Laterality: N/A;   ESOPHAGOGASTRODUODENOSCOPY N/A 09/11/2019   Procedure: ESOPHAGOGASTRODUODENOSCOPY (EGD);  Surgeon: Linden Dolin, MD;  Location: [facility] OR;  Service: Thoracic;  Laterality: N/A;   GRAFT APPLICATION Bilateral 03/27/2020   Procedure: APPLICATION OF SKIN GRAFT BILATERAL FEET;  Surgeon: Felecia Shelling, DPM;  Location: [facility];  Service: Podiatry;  Laterality: Bilateral;   IR REPLC GASTRO/COLONIC TUBE PERCUT W/FLUORO  10/14/2019   IRRIGATION AND DEBRIDEMENT SHOULDER Right 09/29/2017   Procedure: IRRIGATION AND DEBRIDEMENT SHOULDER;  Surgeon: Tarry Kos, MD;  Location: [facility] OR;  Service: Orthopedics;  Laterality: Right;   LUMBAR DISC SURGERY  2002   NASAL ENDOSCOPY WITH EPISTAXIS CONTROL N/A 08/31/2019   Procedure: NASAL ENDOSCOPY WITH EPISTAXIS CONTROL WITH CAUTERIZATION;  Surgeon: Christia Reading, MD;  Location: [facility] OR;  Service: ENT;  Laterality: N/A;   PORTACATH PLACEMENT N/A 09/11/2019   Procedure: PEG TUBE INSERTION - BEDSIDE;  Surgeon: Linden Dolin, MD;  Location: [facility] OR;  Service: Thoracic;  Laterality: N/A;   TEE WITHOUT CARDIOVERSION N/A 08/28/2019   Procedure: TRANSESOPHAGEAL ECHOCARDIOGRAM (TEE);  Surgeon: Donata Clay, Theron Arista, MD;  Location: [facility] OR;  Service: Open Heart Surgery;  Laterality: N/A;   TEE WITHOUT CARDIOVERSION N/A 09/10/2019   Procedure: TRANSESOPHAGEAL ECHOCARDIOGRAM (TEE);  Surgeon: Linden Dolin, MD;  Location: [facility] OR;  Service: Open Heart Surgery;  Laterality: N/A;   WOUND DEBRIDEMENT Bilateral 03/27/2020   Procedure: EXCISIONAL DEBRIDEMENT OF ULCERS BILATERAL FEET;   Surgeon: Felecia Shelling, DPM;  Location: [facility];  Service: Podiatry;  Laterality: Bilateral;    Social History: Social History   Socioeconomic History   Marital status: Married    Spouse name: Not on file   Number of children: 1   Years of education: Not on file   Highest education level: Not on file  Occupational History   Occupation: delivery driver   Occupation: Stay at home spouse  Tobacco Use   Smoking status: Never   Smokeless tobacco: Never  Vaping Use   Vaping status: Never Used  Substance and Sexual Activity   Alcohol use: Yes    Comment: rarely 2-3 times a year   Drug use: No   Sexual activity: Yes    Partners: Female  Other Topics Concern   Not on  file  Social History Narrative   Not on file   Social Drivers of Health   Financial Resource Strain: Not on file  Food Insecurity: Not on file  Transportation Needs: Not on file  Physical Activity: Not on file  Stress: Not on file  Social Connections: Not on file  Intimate Partner Violence: Not on file    Family History: Family History  Problem Relation Age of Onset   Migraines Mother    GER disease Mother    Heart disease Father    Hyperlipidemia Father    Diabetes Father    Hypertension Father    Sleep apnea Father    Obesity Father    Asthma Brother    Pancreatic cancer Maternal Grandmother    Heart disease Maternal Grandfather    Diabetes Paternal Grandmother    Hypertension Paternal Grandmother     Review of Systems: ROS Denies any recent chest pain, difficulty breathing, or leg swelling, fevers.  Physical Exam: Vital Signs There were no vitals taken for this visit.  Physical Exam Constitutional:      General: Not in acute distress.    Appearance: Normal appearance. Not ill-appearing.  HENT:     Head: Normocephalic and atraumatic.  Eyes:     Pupils: Pupils are equal, round. Cardiovascular:     Rate and Rhythm: Normal rate.    Pulses: Normal pulses.  Pulmonary:     Effort: No  respiratory distress or increased work of breathing.  Speaks in full sentences. Abdominal:     General: Abdomen is flat. No distension.   Musculoskeletal: Normal range of motion. No lower extremity swelling or edema. No varicosities.  Skin:    General: Skin is warm and dry.  Left foot wound: See picture.    Findings: No erythema or rash.  Neurological:     Mental Status: Alert and oriented to person, place, and time.  Psychiatric:        Mood and Affect: Mood normal.        Behavior: Behavior normal.      Assessment/Plan: The patient is scheduled for left plantar surface wound debridement with placement of matrix with Dr. Ladona Ridgel.  Risks, benefits, and alternatives of procedure discussed, questions answered and consent obtained.    Smoking Status: Non-smoker.  Caprini Score: 7; Risk Factors include: Age, BMI greater than 40, wheelchair-bound, and length of planned surgery. Recommendation for mechanical and possibly pharmacological prophylaxis. Will discuss with Dr. Ladona Ridgel and prescribe Lovenox if indicated.  Pictures obtained: Today.  Post-op Rx sent to pharmacy: Zofran.  He reports ample remaining Vicodin at home.  Patient was provided with the General Surgical Risk consent document and Pain Medication Agreement prior to their appointment.  They had adequate time to read through the risk consent documents and Pain Medication Agreement. We also discussed them in person together during this preop appointment. All of their questions were answered to their satisfaction.  Recommended calling if they have any further questions.  Risk consent form and Pain Medication Agreement to be scanned into patient's chart.

## 2023-10-05 NOTE — Progress Notes (Unsigned)
 Patient ID: [name], male    DOB: [birth year], 48 y.o.   MRN: [id number]  Pre-Operative Exam   Chronic left foot wound   History of Present Illness: [name] is a 48 y.o.  male  with a history of bilateral foot wounds.  He presents for preoperative evaluation for upcoming procedure, left plantar surface wound debridement with placement of matrix, scheduled for 10/17/2023 with Dr. Ladona Ridgel.  The patient has not had problems with anesthesia.  Denies any personal or family history of blood clots or clotting disorder.  He denies any nicotine use disorder or use of anticoagulation.  He is accompanied by spouse at bedside.  Reports that his wounds are subsequent to COVID-19 hospitalization in 2020.  He is seeing wound care for regular debridement and they have been doing a good job, but persistent wounds nonetheless.  He is wheelchair-bound.  Summary of Previous Visit: Patient met with Dr. Ladona Ridgel for initial consult 06/07/2023.  At that time, discussed his bilateral foot ulcerations s/p treatments from podiatry and wound clinic.  Right foot had nearly healed entirely, but left foot wound remained 3 x 5.5 cm.  It was suspected that he would ultimately require a fasciocutaneous free flap over the heel, but patient expressed that he would first prefer a less invasive option.  Discussed the plans for myriad placement.  She then had a preoperative visit 06/20/2023 at which time risks of surgery were discussed, but patient was ultimately canceled due to need for surgical clearances.  Cardiac clearance received by [place] NP at [facility] 09/25/2023.  Acceptable risk for planned procedure without further cardiovascular testing needed.  Perioperative assessment of pulmonary risk for nonthoracic surgery was deemed low risk 1.6% per his pulmonologist.  Job: Disability.  PMH Significant for: Asthma in setting of diffuse alveolar damage, suspected pulmonary hypertension likely related to chronic  lung disease, T2DM with most recent A1c 6.1%, chronic pain but has not filled prescription for narcotic since November 2024, right foot osteomyelitis, morbid obesity, wheelchair bound.  Low risk nuclear stress testing and echocardiogram and 08/2023.   Past Medical History: Allergies: No Known Allergies  Current Medications:  Current Outpatient Medications:    albuterol (VENTOLIN HFA) 108 (90 Base) MCG/ACT inhaler, TAKE 2 PUFFS BY MOUTH EVERY 4 HOURS AS NEEDED, Disp: 18 each, Rfl: 2   amLODipine (NORVASC) 5 MG tablet, Take 5 mg by mouth daily., Disp: , Rfl:    Budeson-Glycopyrrol-Formoterol (BREZTRI AEROSPHERE) 160-9-4.8 MCG/ACT AERO, Inhale 2 puffs into the lungs in the morning and at bedtime., Disp: 3 each, Rfl: 1   cetirizine (ZYRTEC) 10 MG tablet, Take 10 mg by mouth daily., Disp: , Rfl:    DULoxetine (CYMBALTA) 60 MG capsule, 1 capsule Orally Once a day for 90, Disp: 90 capsule, Rfl: 1   HYDROcodone-acetaminophen (NORCO) 5-325 MG tablet, Take 1 tablet by mouth 2 (two) times daily as needed for moderate pain (pain score 4-6)., Disp: 60 tablet, Rfl: 0   ipratropium (ATROVENT) 0.06 % nasal spray, Place 2 sprays into both nostrils 3 (three) times daily., Disp: 15 mL, Rfl: 5   lamoTRIgine (LAMICTAL) 100 MG tablet, Take 1 tablet (100 mg total) by mouth daily., Disp: 90 tablet, Rfl: 1   metoprolol tartrate (LOPRESSOR) 25 MG tablet, Take 25 mg by mouth 2 (two) times daily., Disp: , Rfl:    montelukast (SINGULAIR) 10 MG tablet, Take 1 tablet (10 mg total) by mouth daily., Disp: 90 tablet, Rfl: 1   OXYGEN, Inhale  2 L into the lungs at bedtime., Disp: , Rfl:    pantoprazole (PROTONIX) 40 MG tablet, Take 1 tablet by mouth daily., Disp: , Rfl:    sildenafil (VIAGRA) 50 MG tablet, 1 tablet as needed Orally Once a day, Disp: , Rfl:    traZODone (DESYREL) 100 MG tablet, Take 2 tablets (200 mg total) by mouth at bedtime., Disp: 180 tablet, Rfl: 3   triamcinolone (NASACORT) 55 MCG/ACT AERO nasal inhaler,  Place 2 sprays into the nose daily., Disp: , Rfl:    Vitamin D, Ergocalciferol, (DRISDOL) 1.25 MG (50000 UNIT) CAPS capsule, TAKE 1 CAPSULE (50,000 UNITS TOTAL) BY MOUTH EVERY 7 (SEVEN) DAYS, Disp: 5 capsule, Rfl: 0  Past Medical Problems: 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   Critical illness polyneuropathy (HCC) 09/15/2020   Depression    Diffuse alveolar damage (HCC) 03/09/2020   Dyspnea    Encounter for attention to tracheostomy (HCC) 09/15/2020   Encounter for therapeutic drug monitoring 09/29/2020   Epigastric pain    Eschar of foot    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   Fever    GERD (gastroesophageal reflux disease)    Goals of care, counseling/discussion    Gram positive bacterial infection    Headache    History of COVID-19    History of kidney stones    LEFT URETERAL STONE   HTN (hypertension)    Hyperlipidemia 07/18/2023   Impaired sensation to light touch 04/15/2020   Insomnia due to other mental disorder 12/06/2021   Left foot drop 09/06/2021   Leukopenia    Long COVID 09/09/2020   Moderate persistent asthma 07/21/2015   Moderate protein-calorie malnutrition (HCC) 09/15/2020   Mood disorder (HCC) 09/07/2022   Morbid obesity (HCC) with starting BMI 46 09/15/2020   MSSA (methicillin susceptible Staphylococcus aureus) infection 08/31/2020   Need for emotional support    Nocturnal hypoxemia 09/07/2022   Obstructive  sleep apnea 01/11/2022   Other fatigue 09/07/2022   Palliative care by specialist    Personal history of ECMO    PES PLANUS 10/08/2009   Qualifier: Diagnosis of   By: Pearletha Forge MD, Vincenza Hews      Replacing diagnoses that were inactivated after the 11/07/22 regulatory import     Pneumonia 07/2019   covid   Prediabetes    Preop cardiovascular exam    Pressure injury of skin 08/22/2019   Primary hypertension 04/17/2017   Pyogenic inflammation of bone (HCC) 09/09/2020   Reactive depression 07/01/2020   Septic arthritis of right acromioclavicular joint (HCC) 09/29/2017   SOBOE (shortness of breath on exertion)    Subcutaneous crepitus    TARSAL TUNNEL SYNDROME, LEFT 10/08/2009   Qualifier: Diagnosis of   By: Pearletha Forge MD, Shane         Thrombocytopenia (HCC)    Type 2 diabetes mellitus without complication, without long-term current use of insulin (HCC) 10/18/2022   Ulcer of left  foot (HCC) 09/07/2022   Vitamin D deficiency 10/18/2022   Wheelchair dependence 06/04/2021    Past Surgical History: Past Surgical History:  Procedure Laterality Date   CANNULATION FOR ECMO (EXTRACORPOREAL MEMBRANE OXYGENATION) N/A 08/28/2019   Procedure: CANNULATION FOR VV ECMO (EXTRACORPOREAL MEMBRANE OXYGENATION);  Surgeon: Donata Clay, Theron Arista, MD;  Location: [facility] OR;  Service: Open Heart Surgery;  Laterality: N/A;  CRESCENT CANNULA   CANNULATION FOR ECMO (EXTRACORPOREAL MEMBRANE OXYGENATION) N/A 09/10/2019   Procedure: CANNULATION FOR ECMO (EXTRACORPOREAL MEMBRANE OXYGENATION) PUTTING IN CRESCENT 32FR CANNULA  AND REMOVING GROING CANNULATION;  Surgeon: Linden Dolin, MD;  Location: [facility] OR;  Service: Open Heart Surgery;  Laterality: N/A;  PUTTING IN CRESCENT/REMOVING GROIN CANNULATION   CYSTOSCOPY/URETEROSCOPY/HOLMIUM LASER/STENT PLACEMENT Left 04/18/2019   Procedure: LEFT URETEROSCOPY/HOLMIUM LASER/STENT PLACEMENT;  Surgeon: Crist Fat, MD;  Location: [facility];  Service: Urology;  Laterality:  Left;   CYSTOSCOPY/URETEROSCOPY/HOLMIUM LASER/STENT PLACEMENT Left 05/02/2019   Procedure: CYSTOSCOPY/URETEROSCOPY/HOLMIUM LASER/STENT EXCHANGE;  Surgeon: Crist Fat, MD;  Location: [facility] ORS;  Service: Urology;  Laterality: Left;   ECMO CANNULATION N/A 08/03/2019   Procedure: ECMO CANNULATION;  Surgeon: Linden Dolin, MD;  Location: [facility] INVASIVE [facility];  Service: Cardiovascular;  Laterality: N/A;   ESOPHAGOGASTRODUODENOSCOPY N/A 09/11/2019   Procedure: ESOPHAGOGASTRODUODENOSCOPY (EGD);  Surgeon: Linden Dolin, MD;  Location: [facility] OR;  Service: Thoracic;  Laterality: N/A;   GRAFT APPLICATION Bilateral 03/27/2020   Procedure: APPLICATION OF SKIN GRAFT BILATERAL FEET;  Surgeon: Felecia Shelling, DPM;  Location: [facility] ORS;  Service: Podiatry;  Laterality: Bilateral;   IR REPLC GASTRO/COLONIC TUBE PERCUT W/FLUORO  10/14/2019   IRRIGATION AND DEBRIDEMENT SHOULDER Right 09/29/2017   Procedure: IRRIGATION AND DEBRIDEMENT SHOULDER;  Surgeon: Tarry Kos, MD;  Location: [facility] OR;  Service: Orthopedics;  Laterality: Right;   LUMBAR DISC SURGERY  2002   NASAL ENDOSCOPY WITH EPISTAXIS CONTROL N/A 08/31/2019   Procedure: NASAL ENDOSCOPY WITH EPISTAXIS CONTROL WITH CAUTERIZATION;  Surgeon: Christia Reading, MD;  Location: [facility] OR;  Service: ENT;  Laterality: N/A;   PORTACATH PLACEMENT N/A 09/11/2019   Procedure: PEG TUBE INSERTION - BEDSIDE;  Surgeon: Linden Dolin, MD;  Location: [facility] OR;  Service: Thoracic;  Laterality: N/A;   TEE WITHOUT CARDIOVERSION N/A 08/28/2019   Procedure: TRANSESOPHAGEAL ECHOCARDIOGRAM (TEE);  Surgeon: Donata Clay, Theron Arista, MD;  Location: [facility] OR;  Service: Open Heart Surgery;  Laterality: N/A;   TEE WITHOUT CARDIOVERSION N/A 09/10/2019   Procedure: TRANSESOPHAGEAL ECHOCARDIOGRAM (TEE);  Surgeon: Linden Dolin, MD;  Location: [facility] OR;  Service: Open Heart Surgery;  Laterality: N/A;   WOUND DEBRIDEMENT Bilateral 03/27/2020   Procedure: EXCISIONAL DEBRIDEMENT OF ULCERS BILATERAL FEET;   Surgeon: Felecia Shelling, DPM;  Location: [facility] ORS;  Service: Podiatry;  Laterality: Bilateral;    Social History: Social History   Socioeconomic History   Marital status: Married    Spouse name: Not on file   Number of children: 1   Years of education: Not on file   Highest education level: Not on file  Occupational History   Occupation: delivery driver   Occupation: Stay at home spouse  Tobacco Use   Smoking status: Never   Smokeless tobacco: Never  Vaping Use   Vaping status: Never Used  Substance and Sexual Activity   Alcohol use: Yes    Comment: rarely 2-3 times a year   Drug use: No   Sexual activity: Yes    Partners: Female  Other Topics Concern   Not on  file  Social History Narrative   Not on file   Social Drivers of Health   Financial Resource Strain: Not on file  Food Insecurity: Not on file  Transportation Needs: Not on file  Physical Activity: Not on file  Stress: Not on file  Social Connections: Not on file  Intimate Partner Violence: Not on file    Family History: Family History  Problem Relation Age of Onset   Migraines Mother    GER disease Mother    Heart disease Father    Hyperlipidemia Father    Diabetes Father    Hypertension Father    Sleep apnea Father    Obesity Father    Asthma Brother    Pancreatic cancer Maternal Grandmother    Heart disease Maternal Grandfather    Diabetes Paternal Grandmother    Hypertension Paternal Grandmother     Review of Systems: ROS Denies any recent chest pain, difficulty breathing, or leg swelling, fevers.  Physical Exam: Vital Signs There were no vitals taken for this visit.  Physical Exam Constitutional:      General: Not in acute distress.    Appearance: Normal appearance. Not ill-appearing.  HENT:     Head: Normocephalic and atraumatic.  Eyes:     Pupils: Pupils are equal, round. Cardiovascular:     Rate and Rhythm: Normal rate.    Pulses: Normal pulses.  Pulmonary:     Effort: No  respiratory distress or increased work of breathing.  Speaks in full sentences. Abdominal:     General: Abdomen is flat. No distension.   Musculoskeletal: Normal range of motion. No lower extremity swelling or edema. No varicosities.  Skin:    General: Skin is warm and dry.  Left foot wound: See picture.    Findings: No erythema or rash.  Neurological:     Mental Status: Alert and oriented to person, place, and time.  Psychiatric:        Mood and Affect: Mood normal.        Behavior: Behavior normal.      Assessment/Plan: The patient is scheduled for left plantar surface wound debridement with placement of matrix with Dr. Ladona Ridgel.  Risks, benefits, and alternatives of procedure discussed, questions answered and consent obtained.    Smoking Status: Non-smoker.  Caprini Score: 7; Risk Factors include: Age, BMI greater than 40, wheelchair-bound, and length of planned surgery. Recommendation for mechanical and possibly pharmacological prophylaxis. Will discuss with Dr. Ladona Ridgel and prescribe Lovenox if indicated.  Pictures obtained: Today.  Post-op Rx sent to pharmacy: Zofran.  He reports ample remaining Vicodin at home.  Patient was provided with the General Surgical Risk consent document and Pain Medication Agreement prior to their appointment.  They had adequate time to read through the risk consent documents and Pain Medication Agreement. We also discussed them in person together during this preop appointment. All of their questions were answered to their satisfaction.  Recommended calling if they have any further questions.  Risk consent form and Pain Medication Agreement to be scanned into patient's chart.

## 2023-10-06 ENCOUNTER — Ambulatory Visit: Payer: PPO | Admitting: Physician Assistant

## 2023-10-06 VITALS — BP 144/89 | HR 82

## 2023-10-06 DIAGNOSIS — T148XXA Other injury of unspecified body region, initial encounter: Secondary | ICD-10-CM

## 2023-10-06 MED ORDER — ONDANSETRON 4 MG PO TBDP: 4.0000 mg | ORAL_TABLET | Freq: Three times a day (TID) | ORAL | 0 refills | Status: AC | PRN

## 2023-10-10 ENCOUNTER — Ambulatory Visit (HOSPITAL_BASED_OUTPATIENT_CLINIC_OR_DEPARTMENT_OTHER): Payer: PPO | Admitting: General Surgery

## 2023-10-12 ENCOUNTER — Encounter (HOSPITAL_COMMUNITY): Payer: Self-pay | Admitting: Plastic Surgery

## 2023-10-12 ENCOUNTER — Other Ambulatory Visit: Payer: Self-pay

## 2023-10-12 ENCOUNTER — Encounter (HOSPITAL_COMMUNITY): Payer: Self-pay

## 2023-10-12 NOTE — Progress Notes (Signed)
 Surgery orders requested via Epic inbox.

## 2023-10-12 NOTE — Progress Notes (Signed)
 COVID Vaccine Completed:  Yes  Date of COVID positive in last 90 days:  No  PCP - Dorinda Hill, MD Cardiologist - Belva Crome, MD Pulmonologist- Irma Newness, MD  Pulmonary clearance in Epic  Chest x-ray - 02-28-23 Epic EKG - 07-19-23 Epic Stress Test - 08-30-23 Epic ECHO - 08-10-23 Epic Cardiac Cath - N/A Pacemaker/ICD device last checked: Spinal Cord Stimulator: N/A  Bowel Prep - N/A  Sleep Study - Yes, neg sleep apnea, diagnosed with nocturnal hypoxemia CPAP - 2L O2 at night  Fasting Blood Sugar - N/A Checks Blood Sugar - does not check   Last dose of GLP1 agonist-  N/A GLP1 instructions:  Hold 7 days before surgery    Last dose of SGLT-2 inhibitors-  N/A SGLT-2 instructions:  Hold 3 days before surgery    Blood Thinner Instructions: N/A Aspirin Instructions: Last Dose:  Activity level:  Can go up a flight of stairs and perform activities of daily living without stopping and without symptoms of chest pain.  Patient has  shortness of breath with exertion at times.  States that SOB has not worsened.  .  Anesthesia review: Nocturnal hypoxemia, uses O2 at 2L at night.  Shortness of breath with exertion, alveolar damage, long COVID, murmur  Patient denies shortness of breath, fever, cough and chest pain at PAT appointment (completed over the phone)  Patient verbalized understanding of instructions that were given to them at the PAT appointment. Patient was also instructed that they will need to review over the PAT instructions again at home before surgery.

## 2023-10-12 NOTE — Patient Instructions (Addendum)
 SURGICAL WAITING ROOM VISITATION Patients having surgery or a procedure may have no more than 2 support people in the waiting area - these visitors may rotate.    Children under the age of 48 must have an adult with them who is not the patient.  Due to an increase in RSV and influenza rates and associated hospitalizations, children ages 22 and under may not visit patients in [facility] hospitals.   If the patient needs to stay at the hospital during part of their recovery, the visitor guidelines for inpatient rooms apply. Pre-op nurse will coordinate an appropriate time for 1 support person to accompany patient in pre-op.  This support person may not rotate.    Please refer to the [facility] website for the visitor guidelines for Inpatients (after your surgery is over and you are in a regular room).       Your procedure is scheduled on: 10-17-23   Report to [facility] Main Entrance    Report to admitting at 1:45 PM   Call this number if you have problems the morning of surgery [phone]   Do not eat food :After Midnight.   After Midnight you may have the following liquids until 1:00 PM DAY OF SURGERY  Water Non-Citrus Juices (without pulp, NO RED-Apple, White grape, White cranberry) Black Coffee (NO MILK/CREAM OR CREAMERS, sugar ok)  Clear Tea (NO MILK/CREAM OR CREAMERS, sugar ok) regular and decaf                             Plain Jell-O (NO RED)                                           Fruit ices (not with fruit pulp, NO RED)                                     Popsicles (NO RED)                                                               Sports drinks like Gatorade (NO RED)                   If you have questions, please contact your surgeon's office.   FOLLOW  ANY ADDITIONAL PRE OP INSTRUCTIONS YOU RECEIVED FROM YOUR SURGEON'S OFFICE!!!     Oral Hygiene is also important to reduce your risk of infection.                                    Remember -  BRUSH YOUR TEETH THE MORNING OF SURGERY WITH YOUR REGULAR TOOTHPASTE   Do NOT smoke after Midnight   Take these medicines the morning of surgery:   Amlodipine   Zyrtec   Duloxetine   Metoprolol   Singulair   Pantoprazole   Okay to use inhalers   Hydrocodone if needed    Stop all vitamins and herbal supplements 7 days before surgery  You may not have any metal on your body including  jewelry, and body piercing             Do not wear  lotions, powders, cologne, or deodorant              Men may shave face and neck.   Do not bring valuables to the hospital. [facility] IS NOT RESPONSIBLE   FOR VALUABLES.   Contacts, dentures or bridgework may not be worn into surgery.   DO NOT BRING YOUR HOME MEDICATIONS TO THE HOSPITAL. PHARMACY WILL DISPENSE MEDICATIONS LISTED ON YOUR MEDICATION LIST TO YOU DURING YOUR ADMISSION IN THE HOSPITAL!    Patients discharged on the day of surgery will not be allowed to drive home.  Someone NEEDS to stay with you for the first 24 hours after anesthesia.              Please read over the following fact sheets you were given: IF YOU HAVE QUESTIONS ABOUT YOUR PRE-OP INSTRUCTIONS PLEASE CALL [phone]  If you received a COVID test during your pre-op visit  it is requested that you wear a mask when out in public, stay away from anyone that may not be feeling well and notify your surgeon if you develop symptoms. If you test positive for Covid or have been in contact with anyone that has tested positive in the last 10 days please notify you surgeon.  [facility] - Preparing for Surgery  Before surgery, you can play an important role.  Because skin is not sterile, your skin needs to be as free of germs as possible.  You can reduce the number of germs on your skin by washing with CHG (chlorahexidine gluconate) soap before surgery.  CHG is an antiseptic cleaner which kills germs and bonds with the skin to continue killing germs even  after washing. Please DO NOT use if you have an allergy to CHG or antibacterial soaps.  If your skin becomes reddened/irritated stop using the CHG and inform your nurse when you arrive at Short Stay. Do not shave (including legs and underarms) for at least 48 hours prior to the first CHG shower.  You may shave your face/neck.  Please follow these instructions carefully:  1.  Shower with CHG Soap the night before surgery and the  morning of surgery.  2.  If you choose to wash your hair, wash your hair first as usual with your normal  shampoo.  3.  After you shampoo, rinse your hair and body thoroughly to remove the shampoo.                             4.  Use CHG as you would any other liquid soap.  You can apply chg directly to the skin and wash.  Gently with a scrungie or clean washcloth.  5.  Apply the CHG Soap to your body ONLY FROM THE NECK DOWN.   Do   not use on face/ open                           Wound or open sores. Avoid contact with eyes, ears mouth and   genitals (private parts).                       Wash face,  Genitals (private parts) with your normal soap.  6.  Wash thoroughly, paying special attention to the area where your    surgery  will be performed.  7.  Thoroughly rinse your body with warm water from the neck down.  8.  DO NOT shower/wash with your normal soap after using and rinsing off the CHG Soap.                9.  Pat yourself dry with a clean towel.            10.  Wear clean pajamas.            11.  Place clean sheets on your bed the night of your first shower and do not  sleep with pets. Day of Surgery : Do not apply any lotions/deodorants the morning of surgery.  Please wear clean clothes to the hospital/surgery center.  FAILURE TO FOLLOW THESE INSTRUCTIONS MAY RESULT IN THE CANCELLATION OF YOUR SURGERY  PATIENT SIGNATURE_________________________________  NURSE  SIGNATURE__________________________________  ________________________________________________________________________

## 2023-10-13 NOTE — Anesthesia Preprocedure Evaluation (Addendum)
 Anesthesia Evaluation  Patient identified by MRN, date of birth, ID band Patient awake    Reviewed: Allergy & Precautions, NPO status , Patient's Chart, lab work & pertinent test results, reviewed documented beta blocker date and time   Airway Mallampati: IV  TM Distance: >3 FB Neck ROM: Full    Dental  (+) Teeth Intact, Dental Advisory Given   Pulmonary shortness of breath (2L O2 QHS), with exertion and Long-Term Oxygen Therapy, asthma , sleep apnea and Oxygen sleep apnea  H/o COVID with subsequent ECMO, trach for respiratory failure   Pulmonary exam normal breath sounds clear to auscultation       Cardiovascular hypertension, Pt. on home beta blockers and Pt. on medications pulmonary hypertensionNormal cardiovascular exam+ Valvular Problems/Murmurs MR  Rhythm:Regular Rate:Normal     Neuro/Psych  Headaches PSYCHIATRIC DISORDERS Anxiety Depression    critical illness myopathy  Neuromuscular disease    GI/Hepatic Neg liver ROS,GERD  Medicated,,  Endo/Other  diabetes, Type 2  Class 4 obesity  Renal/GU negative Renal ROS     Musculoskeletal  (+) Arthritis ,  Chronic wound   Abdominal   Peds  Hematology negative hematology ROS (+)   Anesthesia Other Findings Day of surgery medications reviewed with the patient.  Reproductive/Obstetrics                              Anesthesia Physical Anesthesia Plan  ASA: 4  Anesthesia Plan: General   Post-op Pain Management: Tylenol PO (pre-op)*   Induction: Intravenous  PONV Risk Score and Plan: 2 and Dexamethasone and Ondansetron  Airway Management Planned: LMA  Additional Equipment:   Intra-op Plan:   Post-operative Plan: Extubation in OR  Informed Consent: I have reviewed the patients History and Physical, chart, labs and discussed the procedure including the risks, benefits and alternatives for the proposed anesthesia with the patient or  authorized representative who has indicated his/her understanding and acceptance.     Dental advisory given  Plan Discussed with: CRNA  Anesthesia Plan Comments: (See PAT note 10/17/2023)        Anesthesia Quick Evaluation

## 2023-10-13 NOTE — Progress Notes (Signed)
 Anesthesia Chart Review   Case: [id number] Date/Time: 10/17/23 1545   Procedures:      Left plantar surface wound preparation and myriad placement (Left)     MYRIAD PLACEMENT (Left)   Anesthesia type: Choice   Pre-op diagnosis: Chronic wound   Location: [facility]OM 08 / [facility]   Surgeons: [name], MD       DISCUSSION:48 y.o. never smoker with h/o HTN, asthma, OSA (uses 2L O2 at night), DM II, chronic wound left foot scheduled for above procedure 10/17/2023 with Dr. Weyman Croon.   Pt follows with pulmonology.  In spring of 2021 pt have ~4 month hospitalization with COVID19 ARDS including 2 months on VV ECMO with tracheostomy s/p decannulation.  Pt was last seen by pulmonology 10/02/2023. Per OV note, "ARISCAT score: LOW RISK 1.6% risk of in-hospital post-op pulmonary complications (composite including respiratory failure, respiratory infection, pleural effusion, atelectasis, pneumothorax, bronchospasm, aspiration pneumonitis)"  Per cardiology preoperative evaluation 09/26/2023, "Chart reviewed as part of pre-operative protocol coverage. [name] was last seen on 07/19/2023 by Dr. Tomie China.  At that time patient complained of DOE.  He underwent a thorough workup for risk stratification.  Coronary CTA demonstrated coronary calcium score of 0.  Nuclear stress testing performed in January 2025 was a normal, low risk study.  Echo January 2025 showed normal LV/RV function with no significant valvular abnormalities.   Spoke with patient via telephone today and he denies new cardiac concerns.   Therefore, based on ACC/AHA guidelines, the patient would be an acceptable risk for the planned procedure without further cardiovascular testing."  VS: Ht 5' 8.5" (1.74 m)   Wt (!) 142.9 kg   BMI 47.20 kg/m   PROVIDERS: Wile, Nyoka Cowden, MD is PCP  Cardiologist - Belva Crome, MD  Pulmonologist- Irma Newness, MD LABS:  labs DOS, same day workup, not seen in PAT clinic  (all labs ordered are  listed, but only abnormal results are displayed)  Labs Reviewed - No data to display   IMAGES:   EKG:   CV: Myocardial Perfusion 08/30/23   The study is normal. The study is low risk.   No ST deviation was noted.   Left ventricular function is normal. Nuclear stress EF: 62%. The left ventricular ejection fraction is normal (55-65%). End diastolic cavity size is normal.  Echo 08/10/2023  1. Left ventricular ejection fraction, by estimation, is 60 to 65%. Left  ventricular ejection fraction by 2D MOD biplane is 69.0 %. The left  ventricle has normal function. The left ventricle has no regional wall  motion abnormalities. Left ventricular  diastolic parameters were normal.   2. Right ventricular systolic function is normal. The right ventricular  size is normal.   3. The mitral valve is normal in structure. No evidence of mitral valve  regurgitation. No evidence of mitral stenosis.   4. The aortic valve is tricuspid. Aortic valve regurgitation is not  visualized. No aortic stenosis is present.   5. Aortic DTA is NWV.   Comparison(s): Echocardiogram done 10/07/19 showed an EF of 70-75%.  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   Critical illness polyneuropathy (HCC) 09/15/2020   Depression    Diffuse alveolar damage (HCC) 03/09/2020   Dyspnea    Encounter for attention to tracheostomy (HCC) 09/15/2020   Encounter for therapeutic drug monitoring 09/29/2020   Epigastric pain     Eschar of foot    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   Fever    GERD (gastroesophageal reflux disease)    Goals of care, counseling/discussion    Gram positive bacterial infection    Headache    History of COVID-19    History of kidney stones    LEFT URETERAL STONE   HTN (hypertension)    Hyperlipidemia 07/18/2023   Impaired sensation to light touch 04/15/2020   Insomnia due to other mental disorder 12/06/2021   Left foot drop 09/06/2021   Leukopenia    Long COVID 09/09/2020   Moderate persistent asthma 07/21/2015   Moderate protein-calorie malnutrition (HCC) 09/15/2020   Mood disorder (HCC) 09/07/2022   Morbid obesity (HCC) with starting BMI 46 09/15/2020   MSSA (methicillin susceptible Staphylococcus aureus) infection 08/31/2020   Need for emotional support    Nocturnal hypoxemia 09/07/2022   Obstructive sleep apnea 01/11/2022   nocturnal hypoxemia, uses 2L O2 at night   On home O2    2L at night   Other fatigue 09/07/2022   Palliative care by specialist    Personal history of ECMO    PES PLANUS 10/08/2009   Qualifier: Diagnosis of   By: Pearletha Forge MD, Vincenza Hews      Replacing diagnoses that were inactivated after the 11/07/22 regulatory import     Pneumonia 07/2019   covid   Prediabetes    Preop cardiovascular exam    Pressure injury of skin 08/22/2019   Primary hypertension 04/17/2017   Pyogenic inflammation of bone (HCC) 09/09/2020   Reactive depression 07/01/2020   Septic arthritis of right acromioclavicular joint (HCC) 09/29/2017   SOBOE (shortness of breath on exertion)    Subcutaneous crepitus    TARSAL TUNNEL SYNDROME, LEFT 10/08/2009   Qualifier: Diagnosis of   By: Pearletha Forge MD, Shane         Thrombocytopenia (HCC)    Type 2 diabetes mellitus without complication, without long-term current use of insulin (HCC) 10/18/2022   Ulcer of left foot (HCC) 09/07/2022   Vitamin D deficiency 10/18/2022   Wheelchair dependence 06/04/2021     Past Surgical History:  Procedure Laterality Date   CANNULATION FOR ECMO (EXTRACORPOREAL MEMBRANE OXYGENATION) N/A 08/28/2019   Procedure: CANNULATION FOR VV ECMO (EXTRACORPOREAL MEMBRANE OXYGENATION);  Surgeon: Donata Clay, Theron Arista, MD;  Location: [facility] OR;  Service: Open Heart Surgery;  Laterality: N/A;  CRESCENT CANNULA   CANNULATION FOR ECMO (EXTRACORPOREAL MEMBRANE OXYGENATION) N/A 09/10/2019   Procedure: CANNULATION FOR ECMO (EXTRACORPOREAL MEMBRANE OXYGENATION) PUTTING IN CRESCENT 32FR CANNULA  AND REMOVING GROING CANNULATION;  Surgeon: Linden Dolin, MD;  Location: [facility] OR;  Service: Open Heart Surgery;  Laterality: N/A;  PUTTING IN CRESCENT/REMOVING GROIN CANNULATION   CYSTOSCOPY/URETEROSCOPY/HOLMIUM LASER/STENT PLACEMENT Left 04/18/2019   Procedure: LEFT URETEROSCOPY/HOLMIUM LASER/STENT PLACEMENT;  Surgeon: Crist Fat, MD;  Location: [facility];  Service: Urology;  Laterality: Left;   CYSTOSCOPY/URETEROSCOPY/HOLMIUM LASER/STENT PLACEMENT Left 05/02/2019   Procedure: CYSTOSCOPY/URETEROSCOPY/HOLMIUM LASER/STENT EXCHANGE;  Surgeon: Crist Fat, MD;  Location[facility];  Service: Urology;  Laterality: Left;   ECMO CANNULATION N/A 08/03/2019   Procedure: ECMO CANNULATION;  Surgeon: Linden Dolin, MD;  Location: [facility];  Service: Cardiovascular;  Laterality: N/A;   ESOPHAGOGASTRODUODENOSCOPY N/A 09/11/2019   Procedure: ESOPHAGOGASTRODUODENOSCOPY (EGD);  Surgeon: Linden Dolin, MD;  Location: [facility] OR;  Service: Thoracic;  Laterality: N/A;   GRAFT APPLICATION Bilateral 03/27/2020   Procedure: APPLICATION OF SKIN GRAFT BILATERAL FEET;  Surgeon: Felecia Shelling, DPM;  Location: [facility];  Service: Podiatry;  Laterality: Bilateral;   IR REPLC GASTRO/COLONIC TUBE PERCUT W/FLUORO  10/14/2019   IRRIGATION AND DEBRIDEMENT SHOULDER Right 09/29/2017   Procedure: IRRIGATION AND DEBRIDEMENT SHOULDER;  Surgeon: Tarry Kos, MD;  Location: [facility];  Service:  Orthopedics;  Laterality: Right;   LUMBAR DISC SURGERY  2002   NASAL ENDOSCOPY WITH EPISTAXIS CONTROL N/A 08/31/2019   Procedure: NASAL ENDOSCOPY WITH EPISTAXIS CONTROL WITH CAUTERIZATION;  Surgeon: Christia Reading, MD;  Location: [facility] OR;  Service: ENT;  Laterality: N/A;   PORT-A-CATH REMOVAL     PORTACATH PLACEMENT N/A 09/11/2019   Procedure: PEG TUBE INSERTION - BEDSIDE;  Surgeon: Linden Dolin, MD;  Location: [facility] OR;  Service: Thoracic;  Laterality: N/A;   TEE WITHOUT CARDIOVERSION N/A 08/28/2019   Procedure: TRANSESOPHAGEAL ECHOCARDIOGRAM (TEE);  Surgeon: Donata Clay, Theron Arista, MD;  Location: [facility] OR;  Service: Open Heart Surgery;  Laterality: N/A;   TEE WITHOUT CARDIOVERSION N/A 09/10/2019   Procedure: TRANSESOPHAGEAL ECHOCARDIOGRAM (TEE);  Surgeon: Linden Dolin, MD;  Location: [facility] OR;  Service: Open Heart Surgery;  Laterality: N/A;   WOUND DEBRIDEMENT Bilateral 03/27/2020   Procedure: EXCISIONAL DEBRIDEMENT OF ULCERS BILATERAL FEET;  Surgeon: Felecia Shelling, DPM;  Location: [facility];  Service: Podiatry;  Laterality: Bilateral;    MEDICATIONS: No current facility-administered medications for this encounter.    albuterol (VENTOLIN HFA) 108 (90 Base) MCG/ACT inhaler   amLODipine (NORVASC) 5 MG tablet   ascorbic acid (VITAMIN C) 500 MG tablet   Budeson-Glycopyrrol-Formoterol (BREZTRI AEROSPHERE) 160-9-4.8 MCG/ACT AERO   cetirizine (ZYRTEC) 10 MG tablet   Cholecalciferol (VITAMIN D3) 50 MCG (2000 UT) capsule   DULoxetine (CYMBALTA) 60 MG capsule   gentamicin ointment (GARAMYCIN) 0.1 %   HYDROcodone-acetaminophen (NORCO) 5-325 MG tablet   ipratropium (ATROVENT) 0.06 % nasal spray   metoprolol tartrate (LOPRESSOR) 25 MG tablet   montelukast (SINGULAIR) 10 MG tablet   OXYGEN   pantoprazole (PROTONIX) 40 MG tablet   sildenafil (VIAGRA) 50 MG tablet   traZODone (DESYREL) 100 MG tablet   zinc gluconate 50 MG tablet   lamoTRIgine (LAMICTAL) 100 MG tablet   ondansetron (ZOFRAN-ODT) 4 MG  disintegrating tablet    Jodell Cipro Ward, PA-C [facility] Pre-Surgical Testing [phone]

## 2023-10-17 ENCOUNTER — Other Ambulatory Visit: Payer: Self-pay

## 2023-10-17 ENCOUNTER — Encounter (HOSPITAL_BASED_OUTPATIENT_CLINIC_OR_DEPARTMENT_OTHER): Payer: Self-pay | Admitting: Pulmonary Disease

## 2023-10-17 ENCOUNTER — Encounter (HOSPITAL_COMMUNITY): Payer: Self-pay | Admitting: Plastic Surgery

## 2023-10-17 ENCOUNTER — Ambulatory Visit (HOSPITAL_COMMUNITY)
Admission: RE | Admit: 2023-10-17 | Discharge: 2023-10-17 | Disposition: A | Discharge status: Home / Self Care | Payer: PPO | Attending: Plastic Surgery | Admitting: Plastic Surgery

## 2023-10-17 ENCOUNTER — Ambulatory Visit (HOSPITAL_BASED_OUTPATIENT_CLINIC_OR_DEPARTMENT_OTHER): Payer: Self-pay | Admitting: Physician Assistant

## 2023-10-17 ENCOUNTER — Ambulatory Visit (HOSPITAL_COMMUNITY): Payer: Self-pay | Admitting: Physician Assistant

## 2023-10-17 ENCOUNTER — Encounter (HOSPITAL_COMMUNITY)
Admission: RE | Disposition: A | Discharge status: Home / Self Care | Payer: Self-pay | Source: Home / Self Care | Attending: Plastic Surgery

## 2023-10-17 DIAGNOSIS — K219 Gastro-esophageal reflux disease without esophagitis: Secondary | ICD-10-CM | POA: Diagnosis not present

## 2023-10-17 DIAGNOSIS — Z9981 Dependence on supplemental oxygen: Secondary | ICD-10-CM | POA: Insufficient documentation

## 2023-10-17 DIAGNOSIS — Z01818 Encounter for other preprocedural examination: Secondary | ICD-10-CM

## 2023-10-17 DIAGNOSIS — E6689 Other obesity not elsewhere classified: Secondary | ICD-10-CM | POA: Insufficient documentation

## 2023-10-17 DIAGNOSIS — S91302A Unspecified open wound, left foot, initial encounter: Secondary | ICD-10-CM

## 2023-10-17 DIAGNOSIS — E119 Type 2 diabetes mellitus without complications: Secondary | ICD-10-CM

## 2023-10-17 DIAGNOSIS — Z6841 Body Mass Index (BMI) 40.0 and over, adult: Secondary | ICD-10-CM | POA: Insufficient documentation

## 2023-10-17 DIAGNOSIS — I10 Essential (primary) hypertension: Secondary | ICD-10-CM

## 2023-10-17 DIAGNOSIS — J45909 Unspecified asthma, uncomplicated: Secondary | ICD-10-CM | POA: Insufficient documentation

## 2023-10-17 DIAGNOSIS — L97421 Non-pressure chronic ulcer of left heel and midfoot limited to breakdown of skin: Secondary | ICD-10-CM | POA: Insufficient documentation

## 2023-10-17 DIAGNOSIS — S91302D Unspecified open wound, left foot, subsequent encounter: Secondary | ICD-10-CM | POA: Diagnosis not present

## 2023-10-17 HISTORY — DX: Dependence on supplemental oxygen: Z99.81

## 2023-10-17 HISTORY — PX: APPLICATION OF WOUND VAC: SHX5189

## 2023-10-17 LAB — CBC
HCT: 49.7 % (ref 39.0–52.0)
Hemoglobin: 15.6 g/dL (ref 13.0–17.0)
MCH: 27.1 pg (ref 26.0–34.0)
MCHC: 31.4 g/dL (ref 30.0–36.0)
MCV: 86.3 fL (ref 80.0–100.0)
Platelets: 148 10*3/uL — ABNORMAL LOW (ref 150–400)
RBC: 5.76 MIL/uL (ref 4.22–5.81)
RDW: 13.4 % (ref 11.5–15.5)
WBC: 8.3 10*3/uL (ref 4.0–10.5)
nRBC: 0 % (ref 0.0–0.2)

## 2023-10-17 LAB — BASIC METABOLIC PANEL
Anion gap: 6 (ref 5–15)
BUN: 18 mg/dL (ref 6–20)
CO2: 27 mmol/L (ref 22–32)
Calcium: 8.8 mg/dL — ABNORMAL LOW (ref 8.9–10.3)
Chloride: 102 mmol/L (ref 98–111)
Creatinine, Ser: 1 mg/dL (ref 0.61–1.24)
GFR, Estimated: >60 mL/min (ref >60)
Glucose, Bld: 98 mg/dL (ref 70–99)
Potassium: 4.8 mmol/L (ref 3.5–5.1)
Sodium: 135 mmol/L (ref 135–145)

## 2023-10-17 LAB — HEMOGLOBIN A1C
Hgb A1c MFr Bld: 6 % — ABNORMAL HIGH (ref 4.8–5.6)
Mean Plasma Glucose: 125.5 mg/dL

## 2023-10-17 LAB — GLUCOSE, CAPILLARY
Glucose-Capillary: 102 mg/dL — ABNORMAL HIGH (ref 70–99)
Glucose-Capillary: 90 mg/dL (ref 70–99)

## 2023-10-17 SURGERY — APPLICATION, WOUND VAC
Anesthesia: General | Laterality: Left

## 2023-10-17 MED ORDER — ONDANSETRON HCL 4 MG/2ML IJ SOLN: 4.0000 mg | Freq: Once | INTRAMUSCULAR | Status: DC | PRN

## 2023-10-17 MED ORDER — FENTANYL CITRATE PF 50 MCG/ML IJ SOSY: 50.0000 ug | PREFILLED_SYRINGE | INTRAMUSCULAR | Status: DC

## 2023-10-17 MED ORDER — LACTATED RINGERS IV SOLN: INTRAVENOUS | Status: DC | PRN

## 2023-10-17 MED ORDER — DEXAMETHASONE SODIUM PHOSPHATE 10 MG/ML IJ SOLN
INTRAMUSCULAR | Status: DC | PRN
Start: 2023-10-17 — End: 2023-10-17
  Administered 2023-10-17: 4 mg via INTRAVENOUS

## 2023-10-17 MED ORDER — MIDAZOLAM HCL 2 MG/2ML IJ SOLN
INTRAMUSCULAR | Status: AC
  Filled 2023-10-17: qty 2

## 2023-10-17 MED ORDER — HYDROMORPHONE HCL 1 MG/ML IJ SOLN
0.2500 mg | INTRAMUSCULAR | Status: DC | PRN
  Administered 2023-10-17 (×2): 0.5 mg via INTRAVENOUS

## 2023-10-17 MED ORDER — EPHEDRINE 5 MG/ML INJ
INTRAVENOUS | Status: AC
  Filled 2023-10-17: qty 5

## 2023-10-17 MED ORDER — PROPOFOL 10 MG/ML IV BOLUS
INTRAVENOUS | Status: DC | PRN
Start: 2023-10-17 — End: 2023-10-17

## 2023-10-17 MED ORDER — FENTANYL CITRATE (PF) 100 MCG/2ML IJ SOLN
INTRAMUSCULAR | Status: AC
Start: 2023-10-17 — End: ?
  Filled 2023-10-17: qty 2

## 2023-10-17 MED ORDER — FENTANYL CITRATE PF 50 MCG/ML IJ SOSY
25.0000 ug | PREFILLED_SYRINGE | INTRAMUSCULAR | Status: DC | PRN
  Administered 2023-10-17 (×3): 50 ug via INTRAVENOUS

## 2023-10-17 MED ORDER — MIDAZOLAM HCL 5 MG/5ML IJ SOLN
INTRAMUSCULAR | Status: DC | PRN
  Administered 2023-10-17: 2 mg via INTRAVENOUS

## 2023-10-17 MED ORDER — ACETAMINOPHEN 500 MG PO TABS
1000.0000 mg | ORAL_TABLET | Freq: Once | ORAL | Status: AC
  Administered 2023-10-17: 1000 mg via ORAL
  Filled 2023-10-17: qty 2

## 2023-10-17 MED ORDER — FENTANYL CITRATE PF 50 MCG/ML IJ SOSY
PREFILLED_SYRINGE | INTRAMUSCULAR | Status: AC
  Filled 2023-10-17: qty 1

## 2023-10-17 MED ORDER — FENTANYL CITRATE (PF) 100 MCG/2ML IJ SOLN
INTRAMUSCULAR | Status: DC | PRN
  Administered 2023-10-17 (×2): 50 ug via INTRAVENOUS

## 2023-10-17 MED ORDER — OXYCODONE HCL 5 MG/5ML PO SOLN: 5.0000 mg | Freq: Once | ORAL | Status: AC | PRN

## 2023-10-17 MED ORDER — FENTANYL CITRATE PF 50 MCG/ML IJ SOSY
PREFILLED_SYRINGE | INTRAMUSCULAR | Status: AC
  Filled 2023-10-17: qty 2

## 2023-10-17 MED ORDER — LACTATED RINGERS IV SOLN: INTRAVENOUS | Status: DC

## 2023-10-17 MED ORDER — ORAL CARE MOUTH RINSE: 15.0000 mL | Freq: Once | OROMUCOSAL | Status: AC

## 2023-10-17 MED ORDER — CHLORHEXIDINE GLUCONATE CLOTH 2 % EX PADS
6.0000 | MEDICATED_PAD | Freq: Once | CUTANEOUS | Status: DC
Start: 2023-10-17 — End: 2023-10-17

## 2023-10-17 MED ORDER — OXYCODONE HCL 5 MG PO TABS
5.0000 mg | ORAL_TABLET | Freq: Once | ORAL | Status: AC | PRN
  Administered 2023-10-17: 5 mg via ORAL

## 2023-10-17 MED ORDER — PHENYLEPHRINE 80 MCG/ML (10ML) SYRINGE FOR IV PUSH (FOR BLOOD PRESSURE SUPPORT)
PREFILLED_SYRINGE | INTRAVENOUS | Status: AC
  Filled 2023-10-17: qty 20

## 2023-10-17 MED ORDER — DEXMEDETOMIDINE HCL IN NACL 80 MCG/20ML IV SOLN
INTRAVENOUS | Status: DC | PRN
Start: 2023-10-17 — End: 2023-10-17
  Administered 2023-10-17 (×2): 8 ug via INTRAVENOUS

## 2023-10-17 MED ORDER — OXYCODONE HCL 5 MG PO TABS
ORAL_TABLET | ORAL | Status: AC
  Filled 2023-10-17: qty 1

## 2023-10-17 MED ORDER — SODIUM CHLORIDE (PF) 0.9 % IJ SOLN
INTRAMUSCULAR | Status: AC
  Filled 2023-10-17: qty 10

## 2023-10-17 MED ORDER — 0.9 % SODIUM CHLORIDE (POUR BTL) OPTIME
TOPICAL | Status: DC | PRN
  Administered 2023-10-17: 1000 mL

## 2023-10-17 MED ORDER — CHLORHEXIDINE GLUCONATE CLOTH 2 % EX PADS: 6.0000 | MEDICATED_PAD | Freq: Once | CUTANEOUS | Status: DC

## 2023-10-17 MED ORDER — CEFAZOLIN SODIUM-DEXTROSE 3-4 GM/150ML-% IV SOLN
3.0000 g | INTRAVENOUS | Status: AC
  Administered 2023-10-17: 3 g via INTRAVENOUS
  Filled 2023-10-17: qty 150

## 2023-10-17 MED ORDER — HYDROMORPHONE HCL 1 MG/ML IJ SOLN
INTRAMUSCULAR | Status: AC
  Filled 2023-10-17: qty 1

## 2023-10-17 MED ORDER — LIDOCAINE HCL (CARDIAC) PF 100 MG/5ML IV SOSY
PREFILLED_SYRINGE | INTRAVENOUS | Status: DC | PRN
  Administered 2023-10-17: 60 mg via INTRATRACHEAL

## 2023-10-17 MED ORDER — CHLORHEXIDINE GLUCONATE 0.12 % MT SOLN
15.0000 mL | Freq: Once | OROMUCOSAL | Status: AC
  Administered 2023-10-17: 15 mL via OROMUCOSAL

## 2023-10-17 MED ORDER — BUPIVACAINE-EPINEPHRINE (PF) 0.5% -1:200000 IJ SOLN
INTRAMUSCULAR | Status: AC
  Filled 2023-10-17: qty 60

## 2023-10-17 MED ORDER — MIDAZOLAM HCL 2 MG/2ML IJ SOLN: 1.0000 mg | INTRAMUSCULAR | Status: DC

## 2023-10-17 MED ORDER — ONDANSETRON HCL 4 MG/2ML IJ SOLN
INTRAMUSCULAR | Status: DC | PRN
  Administered 2023-10-17: 4 mg via INTRAVENOUS

## 2023-10-17 MED ORDER — PROPOFOL 10 MG/ML IV BOLUS
INTRAVENOUS | Status: DC | PRN
  Administered 2023-10-17: 180 mg via INTRAVENOUS

## 2023-10-17 MED ORDER — EPHEDRINE SULFATE (PRESSORS) 50 MG/ML IJ SOLN
INTRAMUSCULAR | Status: DC | PRN
  Administered 2023-10-17 (×2): 10 mg via INTRAVENOUS

## 2023-10-17 SURGICAL SUPPLY — 47 items
BAG COUNTER SPONGE SURGICOUNT (BAG) IMPLANT
BAG ZIPLOCK 12X15 (MISCELLANEOUS) ×2 IMPLANT
BNDG ELASTIC 4INX 5YD STR LF (GAUZE/BANDAGES/DRESSINGS) IMPLANT
BNDG ELASTIC 6INX 5YD STR LF (GAUZE/BANDAGES/DRESSINGS) ×2 IMPLANT
BNDG GAUZE DERMACEA FLUFF 4 (GAUZE/BANDAGES/DRESSINGS) IMPLANT
BOOT STEPPER DURA LG (SOFTGOODS) IMPLANT
CORD BIPOLAR FORCEPS 12FT (ELECTRODE) IMPLANT
COVER SURGICAL LIGHT HANDLE (MISCELLANEOUS) ×2 IMPLANT
CUFF TOURN SGL QUICK 18X4 (TOURNIQUET CUFF) IMPLANT
CUFF TRNQT CYL 24X4X16.5-23 (TOURNIQUET CUFF) IMPLANT
DRAIN CHANNEL 19F RND (DRAIN) IMPLANT
DRAPE SURG 17X11 SM STRL (DRAPES) ×2 IMPLANT
DRSG CUTIMED SORBACT 7X9 (GAUZE/BANDAGES/DRESSINGS) IMPLANT
DRSG EMULSION OIL 3X16 NADH (GAUZE/BANDAGES/DRESSINGS) IMPLANT
DURAPREP 26ML APPLICATOR (WOUND CARE) ×2 IMPLANT
ELECT REM PT RETURN 15FT ADLT (MISCELLANEOUS) ×2 IMPLANT
EVACUATOR SILICONE 100CC (DRAIN) IMPLANT
GAUZE PAD ABD 8X10 STRL (GAUZE/BANDAGES/DRESSINGS) IMPLANT
GAUZE SPONGE 4X4 12PLY STRL (GAUZE/BANDAGES/DRESSINGS) IMPLANT
GLOVE BIO SURGEON STRL SZ 6.5 (GLOVE) ×2 IMPLANT
GLOVE SURG ORTHO 8.0 STRL STRW (GLOVE) IMPLANT
GRAFT MYRIAD 3 LAYER 5X5 (Graft) IMPLANT
KIT BASIN OR (CUSTOM PROCEDURE TRAY) ×2 IMPLANT
KIT TURNOVER KIT A (KITS) IMPLANT
MANIFOLD NEPTUNE II (INSTRUMENTS) ×2 IMPLANT
NDL HYPO 22X1.5 SAFETY MO (MISCELLANEOUS) IMPLANT
NDL HYPO 25X1 1.5 SAFETY (NEEDLE) IMPLANT
NEEDLE HYPO 22X1.5 SAFETY MO (MISCELLANEOUS) IMPLANT
NEEDLE HYPO 25X1 1.5 SAFETY (NEEDLE) IMPLANT
NS IRRIG 1000ML POUR BTL (IV SOLUTION) ×2 IMPLANT
PACK ORTHO EXTREMITY (CUSTOM PROCEDURE TRAY) ×2 IMPLANT
PAD CAST 3X4 CTTN HI CHSV (CAST SUPPLIES) IMPLANT
PAD CAST 4YDX4 CTTN HI CHSV (CAST SUPPLIES) ×4 IMPLANT
PENCIL SMOKE EVACUATOR (MISCELLANEOUS) IMPLANT
SET HNDPC FAN SPRY TIP SCT (DISPOSABLE) IMPLANT
SOL PREP POV-IOD 4OZ 10% (MISCELLANEOUS) ×2 IMPLANT
STAPLER SKIN PROX WIDE 3.9 (STAPLE) IMPLANT
SURGILUBE 2OZ TUBE FLIPTOP (MISCELLANEOUS) ×2 IMPLANT
SUT MNCRL AB 4-0 PS2 18 (SUTURE) IMPLANT
SUT PROLENE 4 0 PS 2 18 (SUTURE) IMPLANT
SUT SILK 0 FSL (SUTURE) IMPLANT
SUT SILK 4 0 PS 2 (SUTURE) ×2 IMPLANT
SUT VIC AB 5-0 PS2 18 (SUTURE) IMPLANT
SWAB COLLECTION DEVICE MRSA (MISCELLANEOUS) IMPLANT
SWAB CULTURE ESWAB REG 1ML (MISCELLANEOUS) IMPLANT
SYR CONTROL 10ML LL (SYRINGE) IMPLANT
WATER STERILE IRR 1000ML POUR (IV SOLUTION) ×2 IMPLANT

## 2023-10-17 NOTE — Op Note (Signed)
 DATE OF OPERATION: 10/17/2023  LOCATION: [facility] operating Room  PREOPERATIVE DIAGNOSIS: Nonhealing wound plantar surface left foot  POSTOPERATIVE DIAGNOSIS: Same  PROCEDURE: Debridement of wound and placement of myriad wound matrix.  Wound size 4.5 x 2 cm x 2 mm deep  SURGEON: Loren Racer, MD  ASSISTANT: Evelena Leyden  EBL: 10 cc  CONDITION: Stable  COMPLICATIONS: None  INDICATION: The patient, [name], is a 48 y.o. male born on 05-01-1976, is here for treatment of a nonhealing wound on the plantar surface of the left heel.   PROCEDURE DETAILS:  The patient was seen prior to surgery and marked.   IV antibiotics were given. The patient was taken to the operating room and given a general anesthetic. A standard time out was performed and all information was confirmed by those in the room. SCDs were placed.   The foot was prepped and draped with Betadine.  The entire surface of the wound was sharply debrided with scissors.  Fibrinous exudate was removed as was the superficial layer of the granulation tissue.  No further tissue was removed.  The area debrided was 4.5 cm x 2 cm x 2 mm.  Hemostasis was achieved with electrocautery.  A 5 x 5 portion of myriad tissue matrix was placed in the wound and sutured in place with 4-0 Monocryl sutures.  A portion of Sorbact was placed over the myriad sutured in place with 4-0 Prolene.  A dressing of surgical lube, saline dampened gauze, gauze, ABD pads were placed over the wound.  These were held in place with Kerlix and an Ace wrap.  The patient was placed in a cam walker boot.  He was awakened from anesthesia without incident transferred to the recovery room in good condition all instrument needle and sponge counts were reported as correct and no complications were appreciated during the procedure. The patient was allowed to wake up and taken to recovery room in stable condition at the end of the case. The family was notified at the end of the case.    The advanced practice practitioner (APP) assisted throughout the case.  The APP was essential in retraction and counter traction when needed to make the case progress smoothly.  This retraction and assistance made it possible to see the tissue plans for the procedure.  The assistance was needed for blood control, tissue re-approximation and assisted with closure of the incision site.

## 2023-10-17 NOTE — Discharge Instructions (Addendum)
 Diet: High protein, low sugar.  Medications: Please take the Zofran as needed for nausea symptoms.  As for pain control, please take over-the-counter medications, as able.  You may also take your residual Vicodin if needed.  Wound care: Your wound was debrided gently. Myriad (tissue matrix) was placed followed by a green Sorbact dressing which has been secured in place with sutures. This dressing will be removed in 10-14 days.  It was copiously covered with KY jelly followed by 4 x 4 gauze, Kerlix (roll gauze), and then Ace wrap.  Recommending that you apply KY jelly directly to the green Sorbact DAILY followed by 4 x 4 gauze, Kerlix, and Ace wrap. This help keeps the Myriad moist and prevents desiccation.   Activity: As tolerated.

## 2023-10-17 NOTE — Anesthesia Procedure Notes (Signed)
 Procedure Name: LMA Insertion Date/Time: 10/17/2023 1:46 PM  Performed by: Deri Fuelling, CRNAPre-anesthesia Checklist: Patient identified, Emergency Drugs available, Suction available and Patient being monitored Patient Re-evaluated:Patient Re-evaluated prior to induction Oxygen Delivery Method: Circle system utilized Preoxygenation: Pre-oxygenation with 100% oxygen Induction Type: IV induction Ventilation: Mask ventilation without difficulty LMA: LMA flexible inserted LMA Size: 5.0 Tube type: Oral Number of attempts: 1 Airway Equipment and Method: Oral airway Placement Confirmation: positive ETCO2 and breath sounds checked- equal and bilateral Tube secured with: Tape Dental Injury: Teeth and Oropharynx as per pre-operative assessment

## 2023-10-17 NOTE — Interval H&P Note (Signed)
 History and Physical Interval Note: No change in exam or indication for surgery Left ankle marked with his concurrence All questions answered Will proceed at his request  10/17/2023 12:49 PM  [name]  has presented today for surgery, with the diagnosis of Chronic wound.  The various methods of treatment have been discussed with the patient and family. After consideration of risks, benefits and other options for treatment, the patient has consented to  Procedure(s): Left plantar surface wound preparation and myriad placement (Left) MYRIAD PLACEMENT (Left) as a surgical intervention.  The patient's history has been reviewed, patient examined, no change in status, stable for surgery.  I have reviewed the patient's chart and labs.  Questions were answered to the patient's satisfaction.     [name]

## 2023-10-17 NOTE — Progress Notes (Signed)
 Orthopedic Tech Progress Note Patient Details:  [name] [birth year] [id number]  Patient ID: [name], male   DOB: 08/30/1975, 49 y.o.   MRN: [id number]  [name] 10/17/2023, 2:17 PM Cam boot delivered to OR

## 2023-10-17 NOTE — Transfer of Care (Signed)
 Immediate Anesthesia Transfer of Care Note  Patient: [name]  Procedure(s) Performed: Left plantar surface wound preparation and myriad placement (Left) MYRIAD PLACEMENT (Left)  Patient Location: PACU  Anesthesia Type:General  Level of Consciousness: awake and alert   Airway & Oxygen Therapy: Patient Spontanous Breathing and Patient connected to face mask oxygen  Post-op Assessment: Report given to RN and Post -op Vital signs reviewed and stable  Post vital signs: Reviewed and stable  Last Vitals:  Vitals Value Taken Time  BP 126/79 10/17/23 1428  Temp    Pulse 79 10/17/23 1430  Resp 10 10/17/23 1430  SpO2 99 % 10/17/23 1430  Vitals shown include unfiled device data.  Last Pain:  Vitals:   10/17/23 1236  TempSrc:   PainSc: 0-No pain      Patients Stated Pain Goal: 2 (10/17/23 1236)  Complications: No notable events documented.

## 2023-10-17 NOTE — Anesthesia Postprocedure Evaluation (Signed)
 Anesthesia Post Note  Patient: [name]  Procedure(s) Performed: Left plantar surface wound preparation and myriad placement (Left) MYRIAD PLACEMENT (Left)     Patient location during evaluation: PACU Anesthesia Type: General Level of consciousness: awake and alert Pain management: pain level controlled Vital Signs Assessment: post-procedure vital signs reviewed and stable Respiratory status: spontaneous breathing, nonlabored ventilation and respiratory function stable Cardiovascular status: blood pressure returned to baseline and stable Postop Assessment: no apparent nausea or vomiting Anesthetic complications: no   No notable events documented.  Last Vitals:  Vitals:   10/17/23 1430 10/17/23 1445  BP: 127/89 122/74  Pulse: 79 64  Resp: 10 13  Temp: 36.9 C   SpO2: 99% 98%    Last Pain:  Vitals:   10/17/23 1455  TempSrc:   PainSc: 5                  Collene Schlichter

## 2023-10-18 ENCOUNTER — Encounter (HOSPITAL_COMMUNITY): Payer: Self-pay | Admitting: Plastic Surgery

## 2023-10-23 ENCOUNTER — Ambulatory Visit: Payer: PPO | Admitting: Plastic Surgery

## 2023-10-23 DIAGNOSIS — Z9889 Other specified postprocedural states: Secondary | ICD-10-CM

## 2023-10-23 DIAGNOSIS — T148XXA Other injury of unspecified body region, initial encounter: Secondary | ICD-10-CM

## 2023-10-23 NOTE — Progress Notes (Signed)
 Mr. Rieth returns today 6 days postop after debridement of the left heel and placement of myriad.  He is doing well with no specific complaints today.  Dressing changes are going well and he has been able to successfully offload the left heel.  The Sorbact is in tact.  There is no drainage from the wound and no erythema around the heel.  Will follow-up on Friday for removal of the Sorbact and continue dressing changes.  Will follow-up with me in 2 to 3 weeks.

## 2023-10-24 DIAGNOSIS — R7303 Prediabetes: Secondary | ICD-10-CM | POA: Diagnosis not present

## 2023-10-24 DIAGNOSIS — F331 Major depressive disorder, recurrent, moderate: Secondary | ICD-10-CM | POA: Diagnosis not present

## 2023-10-24 DIAGNOSIS — F419 Anxiety disorder, unspecified: Secondary | ICD-10-CM | POA: Diagnosis not present

## 2023-10-24 DIAGNOSIS — J9610 Chronic respiratory failure, unspecified whether with hypoxia or hypercapnia: Secondary | ICD-10-CM | POA: Diagnosis not present

## 2023-10-24 DIAGNOSIS — D696 Thrombocytopenia, unspecified: Secondary | ICD-10-CM | POA: Diagnosis not present

## 2023-10-24 DIAGNOSIS — G7281 Critical illness myopathy: Secondary | ICD-10-CM | POA: Diagnosis not present

## 2023-10-24 DIAGNOSIS — R0609 Other forms of dyspnea: Secondary | ICD-10-CM | POA: Diagnosis not present

## 2023-10-24 DIAGNOSIS — I10 Essential (primary) hypertension: Secondary | ICD-10-CM | POA: Diagnosis not present

## 2023-10-24 DIAGNOSIS — J4540 Moderate persistent asthma, uncomplicated: Secondary | ICD-10-CM | POA: Diagnosis not present

## 2023-10-24 DIAGNOSIS — Z6841 Body Mass Index (BMI) 40.0 and over, adult: Secondary | ICD-10-CM | POA: Diagnosis not present

## 2023-10-27 ENCOUNTER — Ambulatory Visit (INDEPENDENT_AMBULATORY_CARE_PROVIDER_SITE_OTHER): Admitting: Physician Assistant

## 2023-10-27 VITALS — BP 137/82 | HR 77

## 2023-10-27 DIAGNOSIS — Z9889 Other specified postprocedural states: Secondary | ICD-10-CM

## 2023-10-27 DIAGNOSIS — T148XXA Other injury of unspecified body region, initial encounter: Secondary | ICD-10-CM

## 2023-10-27 NOTE — Progress Notes (Addendum)
 Patient is a pleasant 48 year old male s/p debridement of chronic nonhealing left foot plantar surface wound with placement of myriad performed 10/17/2023 by Dr. Ladona Ridgel who presents to clinic for postoperative follow-up.  He was last seen here in clinic on 10/23/2023.  At that time, he was doing well without any specific complaints.  Sorbact was intact.  Recommended that he return in a few days for removal of the Sorbact.    Today, patient is doing okay.  Denies any significant pain symptoms.  Feels prepared for Sorbact removal.  No specific concerns or complaints and doing well from postoperative standpoint.  On exam, Sorbact is removed without complication or difficulty.  Wound measures 5 x 2.5 cm, approximately 0.5 cm depth.  There is a good amount of residual unincorporated myriad at the base of the wound.  The skin edges are a bit rolled and macerated, but otherwise exam is benign.  No surrounding induration or concern for cellulitis.  Recommending continued Adaptic followed by K-Y jelly daily.  This can be covered by a moistened 4 x 4 gauze and then secured with Kerlix.  Follow-up in 10 to 14 days, as scheduled.  He understands they can certainly call the office should he have questions or concerns in interim. Picture(s) obtained of the patient and placed in the chart were with the patient's or guardian's permission.

## 2023-10-31 ENCOUNTER — Ambulatory Visit: Payer: PPO | Admitting: Allergy & Immunology

## 2023-11-03 ENCOUNTER — Encounter: Attending: Physical Medicine and Rehabilitation | Admitting: Physical Medicine and Rehabilitation

## 2023-11-03 ENCOUNTER — Encounter: Payer: Self-pay | Admitting: Physical Medicine and Rehabilitation

## 2023-11-03 VITALS — BP 156/87 | HR 74 | Ht 68.5 in | Wt 316.0 lb

## 2023-11-03 DIAGNOSIS — U099 Post covid-19 condition, unspecified: Secondary | ICD-10-CM

## 2023-11-03 DIAGNOSIS — L97525 Non-pressure chronic ulcer of other part of left foot with muscle involvement without evidence of necrosis: Secondary | ICD-10-CM | POA: Diagnosis not present

## 2023-11-03 DIAGNOSIS — G6281 Critical illness polyneuropathy: Secondary | ICD-10-CM | POA: Diagnosis not present

## 2023-11-03 DIAGNOSIS — M21372 Foot drop, left foot: Secondary | ICD-10-CM

## 2023-11-03 NOTE — Patient Instructions (Signed)
 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 Occurred 12/20.  Also has L foot drop. Still w/c dependent- still cannot put weight on feet per Plastics.  Here in f/u for critical illness polyneuropathy and wounds on feet B/L    Needs to get/stay off L foot except for bathroom. Per Producer, television/film/video.   2.  Tries to discuss has to  stay off foot AS MUCH AS POSSIBLE!   3. Can get a power foldable w/c on [employer]- around $1800.  Weight 316 lbs.   4.  Got oral drug screen at last appt- so not due today- however not bringing bottle to appointments- per clinic policy.    5.  Has Norco- last filled 11/24- doesn't need refill- has 20+ left.    6. Not taking Lamictal from Psych NP.    7. Needs a therapist AND someone who writes meds- so Avelina Laine is appropriate to see as well.    8.  Needs either to prescribe Flomax which shrinks the prostate OR referral to Urology. Needs this referral or treatment.    9.  PCP needs to check Vit D level and see if you are at a good level- the goal is 40-100- My goal for patients is ~ 50-60-    10. He is aware he is prediabetic- 6.0- work on this- will help healing.  Try to cut out carbs and sodas. Is eating at fast food- 3-4 days/week for breakfast per wife- 80% is diet not just exercise.      11.  F/U in 3 months double visit-

## 2023-11-03 NOTE — Progress Notes (Signed)
 Subjective:    Patient ID: [name], male    DOB: 1976/02/05, 48 y.o.   MRN: [id number]  HPI   Pt is a 48 yr old male with COVID ICU myopathy, Long COVID,  s/p surgery on feet due to necrosis from long ICU stay/pressors to save life-s/p  skin grafts and R foot osteomyelitis as well. Occurred 12/20.  Also has L foot drop. Still w/c dependent- still cannot put weight on feet per Plastics.  Here in f/u for critical illness polyneuropathy and wounds on feet B/L     Had surgery 10/17/23- for L foot-  No weight bearing except going to bathroom.   Showed me picture of L foot- did "myriad placement"- after debridement- was applied- sheep's intestine? Tough lining-   [place] Spaniel has to use foot- to shower self- sits to shower, take son to school; and go to bathroom.    Last used pain meds 2 weeks ago- and before then, not much- only uses if really hurting- mainly so can sleep at night.    Decided wants to do L foot drop brace- wants it to be "designed".    Does think has BPH-  PCP told him that has BPH-  Sometimes flows out OK and once in awhile, will have difficulty peeing.   Takes Zinc, Vit D and Vit C. Takes 4000 units/day.    Not doing as much in school- but not as much as "should".  Was wanting to do cyber security  Pain Inventory Average Pain 5 Pain Right Now 4 My pain is sharp and burning  In the last 24 hours, has pain interfered with the following? General activity 5 Relation with others 5 Enjoyment of life 7 What TIME of day is your pain at its worst? evening Sleep (in general) Poor  Pain is worse with: walking, bending, and standing Pain improves with: rest, heat/ice, and medication Relief from Meds: 8  Family History  Problem Relation Age of Onset   Migraines Mother    GER disease Mother    Heart disease Father    Hyperlipidemia Father    Diabetes Father    Hypertension Father    Sleep apnea Father    Obesity Father    Asthma Brother    Pancreatic  cancer Maternal Grandmother    Heart disease Maternal Grandfather    Diabetes Paternal Grandmother    Hypertension Paternal Grandmother    Social History   Socioeconomic History   Marital status: Married    Spouse name: Not on file   Number of children: 1   Years of education: Not on file   Highest education level: Not on file  Occupational History   Occupation: delivery driver   Occupation: Stay at home spouse  Tobacco Use   Smoking status: Never   Smokeless tobacco: Never  Vaping Use   Vaping status: Never Used  Substance and Sexual Activity   Alcohol use: Yes    Comment: rarely 2-3 times a year   Drug use: No   Sexual activity: Yes    Partners: Female  Other Topics Concern   Not on file  Social History Narrative   Not on file   Social Drivers of Health   Financial Resource Strain: Not on file  Food Insecurity: Not on file  Transportation Needs: Not on file  Physical Activity: Not on file  Stress: Not on file  Social Connections: Not on file   Past Surgical History:  Procedure Laterality Date  APPLICATION OF WOUND VAC Left 10/17/2023   Procedure: Left plantar surface wound preparation and myriad placement;  Surgeon: [name], MD;  Location: [facility];  Service: Plastics;  Laterality: Left;   CANNULATION FOR ECMO (EXTRACORPOREAL MEMBRANE OXYGENATION) N/A 08/28/2019   Procedure: CANNULATION FOR VV ECMO (EXTRACORPOREAL MEMBRANE OXYGENATION);  Surgeon: Donata Clay, Theron Arista, MD;  Location: [facility] OR;  Service: Open Heart Surgery;  Laterality: N/A;  CRESCENT CANNULA   CANNULATION FOR ECMO (EXTRACORPOREAL MEMBRANE OXYGENATION) N/A 09/10/2019   Procedure: CANNULATION FOR ECMO (EXTRACORPOREAL MEMBRANE OXYGENATION) PUTTING IN CRESCENT 32FR CANNULA  AND REMOVING GROING CANNULATION;  Surgeon: Linden Dolin, MD;  Location: [facility] OR;  Service: Open Heart Surgery;  Laterality: N/A;  PUTTING IN CRESCENT/REMOVING GROIN CANNULATION   CYSTOSCOPY/URETEROSCOPY/HOLMIUM LASER/STENT  PLACEMENT Left 04/18/2019   Procedure: LEFT URETEROSCOPY/HOLMIUM LASER/STENT PLACEMENT;  Surgeon: Crist Fat, MD;  Location: [facility];  Service: Urology;  Laterality: Left;   CYSTOSCOPY/URETEROSCOPY/HOLMIUM LASER/STENT PLACEMENT Left 05/02/2019   Procedure: CYSTOSCOPY/URETEROSCOPY/HOLMIUM LASER/STENT EXCHANGE;  Surgeon: Crist Fat, MD;  Location: [facility];  Service: Urology;  Laterality: Left;   ECMO CANNULATION N/A 08/03/2019   Procedure: ECMO CANNULATION;  Surgeon: Linden Dolin, MD;  Location: [facility];  Service: Cardiovascular;  Laterality: N/A;   ESOPHAGOGASTRODUODENOSCOPY N/A 09/11/2019   Procedure: ESOPHAGOGASTRODUODENOSCOPY (EGD);  Surgeon: Linden Dolin, MD;  Location: [facility] OR;  Service: Thoracic;  Laterality: N/A;   GRAFT APPLICATION Bilateral 03/27/2020   Procedure: APPLICATION OF SKIN GRAFT BILATERAL FEET;  Surgeon: Felecia Shelling, DPM;  Location: [facility];  Service: Podiatry;  Laterality: Bilateral;   IR REPLC GASTRO/COLONIC TUBE PERCUT W/FLUORO  10/14/2019   IRRIGATION AND DEBRIDEMENT SHOULDER Right 09/29/2017   Procedure: IRRIGATION AND DEBRIDEMENT SHOULDER;  Surgeon: Tarry Kos, MD;  Location: [facility];  Service: Orthopedics;  Laterality: Right;   LUMBAR DISC SURGERY  2002   NASAL ENDOSCOPY WITH EPISTAXIS CONTROL N/A 08/31/2019   Procedure: NASAL ENDOSCOPY WITH EPISTAXIS CONTROL WITH CAUTERIZATION;  Surgeon: Christia Reading, MD;  Location: [facility] OR;  Service: ENT;  Laterality: N/A;   PORT-A-CATH REMOVAL     PORTACATH PLACEMENT N/A 09/11/2019   Procedure: PEG TUBE INSERTION - BEDSIDE;  Surgeon: Linden Dolin, MD;  Location: [facility];  Service: Thoracic;  Laterality: N/A;   TEE WITHOUT CARDIOVERSION N/A 08/28/2019   Procedure: TRANSESOPHAGEAL ECHOCARDIOGRAM (TEE);  Surgeon: Donata Clay, Theron Arista, MD;  Location: [facility] OR;  Service: Open Heart Surgery;  Laterality: N/A;   TEE WITHOUT CARDIOVERSION N/A 09/10/2019   Procedure: TRANSESOPHAGEAL  ECHOCARDIOGRAM (TEE);  Surgeon: Linden Dolin, MD;  Location: [facility] OR;  Service: Open Heart Surgery;  Laterality: N/A;   WOUND DEBRIDEMENT Bilateral 03/27/2020   Procedure: EXCISIONAL DEBRIDEMENT OF ULCERS BILATERAL FEET;  Surgeon: Felecia Shelling, DPM;  Location: [facility];  Service: Podiatry;  Laterality: Bilateral;   Past Surgical History:  Procedure Laterality Date   APPLICATION OF WOUND VAC Left 10/17/2023   Procedure: Left plantar surface wound preparation and myriad placement;  Surgeon: [name], MD;  Location: [facility];  Service: Plastics;  Laterality: Left;   CANNULATION FOR ECMO (EXTRACORPOREAL MEMBRANE OXYGENATION) N/A 08/28/2019   Procedure: CANNULATION FOR VV ECMO (EXTRACORPOREAL MEMBRANE OXYGENATION);  Surgeon: Donata Clay, Theron Arista, MD;  Location: [facility] OR;  Service: Open Heart Surgery;  Laterality: N/A;  CRESCENT CANNULA   CANNULATION FOR ECMO (EXTRACORPOREAL MEMBRANE OXYGENATION) N/A 09/10/2019   Procedure: CANNULATION FOR ECMO (EXTRACORPOREAL MEMBRANE OXYGENATION) PUTTING IN CRESCENT 32FR CANNULA  AND REMOVING GROING  CANNULATION;  Surgeon: Linden Dolin, MD;  Location: [facility] OR;  Service: Open Heart Surgery;  Laterality: N/A;  PUTTING IN CRESCENT/REMOVING GROIN CANNULATION   CYSTOSCOPY/URETEROSCOPY/HOLMIUM LASER/STENT PLACEMENT Left 04/18/2019   Procedure: LEFT URETEROSCOPY/HOLMIUM LASER/STENT PLACEMENT;  Surgeon: Crist Fat, MD;  Location: [facility];  Service: Urology;  Laterality: Left;   CYSTOSCOPY/URETEROSCOPY/HOLMIUM LASER/STENT PLACEMENT Left 05/02/2019   Procedure: CYSTOSCOPY/URETEROSCOPY/HOLMIUM LASER/STENT EXCHANGE;  Surgeon: Crist Fat, MD;  Location: [facility];  Service: Urology;  Laterality: Left;   ECMO CANNULATION N/A 08/03/2019   Procedure: ECMO CANNULATION;  Surgeon: Linden Dolin, MD;  [facility];  Service: Cardiovascular;  Laterality: N/A;   ESOPHAGOGASTRODUODENOSCOPY N/A 09/11/2019   Procedure:  ESOPHAGOGASTRODUODENOSCOPY (EGD);  Surgeon: Linden Dolin, MD;  Location: [facility] OR;  Service: Thoracic;  Laterality: N/A;   GRAFT APPLICATION Bilateral 03/27/2020   Procedure: APPLICATION OF SKIN GRAFT BILATERAL FEET;  Surgeon: Felecia Shelling, DPM;  Location: [facility];  Service: Podiatry;  Laterality: Bilateral;   IR REPLC GASTRO/COLONIC TUBE PERCUT W/FLUORO  10/14/2019   IRRIGATION AND DEBRIDEMENT SHOULDER Right 09/29/2017   Procedure: IRRIGATION AND DEBRIDEMENT SHOULDER;  Surgeon: Tarry Kos, MD;  Location: [facility];  Service: Orthopedics;  Laterality: Right;   LUMBAR DISC SURGERY  2002   NASAL ENDOSCOPY WITH EPISTAXIS CONTROL N/A 08/31/2019   Procedure: NASAL ENDOSCOPY WITH EPISTAXIS CONTROL WITH CAUTERIZATION;  Surgeon: Christia Reading, MD;  Location: [facility] OR;  Service: ENT;  Laterality: N/A;   PORT-A-CATH REMOVAL     PORTACATH PLACEMENT N/A 09/11/2019   Procedure: PEG TUBE INSERTION - BEDSIDE;  Surgeon: Linden Dolin, MD;  Location: [facility];  Service: Thoracic;  Laterality: N/A;   TEE WITHOUT CARDIOVERSION N/A 08/28/2019   Procedure: TRANSESOPHAGEAL ECHOCARDIOGRAM (TEE);  Surgeon: Donata Clay, Theron Arista, MD;  Location: [facility] OR;  Service: Open Heart Surgery;  Laterality: N/A;   TEE WITHOUT CARDIOVERSION N/A 09/10/2019   Procedure: TRANSESOPHAGEAL ECHOCARDIOGRAM (TEE);  Surgeon: Linden Dolin, MD;  Location: [facility] OR;  Service: Open Heart Surgery;  Laterality: N/A;   WOUND DEBRIDEMENT Bilateral 03/27/2020   Procedure: EXCISIONAL DEBRIDEMENT OF ULCERS BILATERAL FEET;  Surgeon: Felecia Shelling, DPM;  Location: [facility];  Service: Podiatry;  Laterality: Bilateral;   Past Medical History:  Diagnosis Date   Abnormal gait 11/12/2009   Qualifier: Diagnosis of   By: Pearletha Forge MD, Shane         Acute osteomyelitis (HCC) 08/31/2020   Advanced care planning/counseling discussion    Advanced directives, counseling/discussion    Allergic rhinoconjunctivitis 07/21/2015   Anginal pain (HCC)    with covid   Anxiety     Asthma    Chest tube in place    Chronic left-sided low back pain with left-sided sciatica 09/19/2017   COVID-19 long hauler manifesting chronic dyspnea 09/15/2020   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   Critical illness polyneuropathy (HCC) 09/15/2020   Depression    Diffuse alveolar damage (HCC) 03/09/2020   Dyspnea    Encounter for attention to tracheostomy (HCC) 09/15/2020   Encounter for therapeutic drug monitoring 09/29/2020   Epigastric pain    Eschar of foot    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   Fever    GERD (gastroesophageal reflux disease)    Goals of care, counseling/discussion    Gram positive bacterial infection    Headache    History of COVID-19    History of kidney stones    LEFT URETERAL STONE  HTN (hypertension)    Hyperlipidemia 07/18/2023   Impaired sensation to light touch 04/15/2020   Insomnia due to other mental disorder 12/06/2021   Left foot drop 09/06/2021   Leukopenia    Long COVID 09/09/2020   Moderate persistent asthma 07/21/2015   Moderate protein-calorie malnutrition (HCC) 09/15/2020   Mood disorder (HCC) 09/07/2022   Morbid obesity (HCC) with starting BMI 46 09/15/2020   MSSA (methicillin susceptible Staphylococcus aureus) infection 08/31/2020   Need for emotional support    Nocturnal hypoxemia 09/07/2022   Obstructive sleep apnea 01/11/2022   nocturnal hypoxemia, uses 2L O2 at night   On home O2    2L at night   Other fatigue 09/07/2022   Palliative care by specialist    Personal history of ECMO    PES PLANUS 10/08/2009   Qualifier: Diagnosis of   By: Pearletha Forge MD, Vincenza Hews      Replacing diagnoses that were inactivated after the 11/07/22 regulatory import     Pneumonia 07/2019   covid   Prediabetes    Preop cardiovascular exam    Pressure injury of skin 08/22/2019   Primary hypertension 04/17/2017   Pyogenic inflammation of bone (HCC) 09/09/2020   Reactive depression 07/01/2020   Septic  arthritis of right acromioclavicular joint (HCC) 09/29/2017   SOBOE (shortness of breath on exertion)    Subcutaneous crepitus    TARSAL TUNNEL SYNDROME, LEFT 10/08/2009   Qualifier: Diagnosis of   By: Pearletha Forge MD, Shane         Thrombocytopenia (HCC)    Type 2 diabetes mellitus without complication, without long-term current use of insulin (HCC) 10/18/2022   Ulcer of left foot (HCC) 09/07/2022   Vitamin D deficiency 10/18/2022   Wheelchair dependence 06/04/2021   BP (!) 156/87   Pulse 74   Ht 5' 8.5" (1.74 m)   Wt (!) 316 lb (143.3 kg)   SpO2 91%   BMI 47.35 kg/m   Opioid Risk Score:   Fall Risk Score:  `1  Depression screen [facility] 2/9     08/07/2023   10:46 AM 06/27/2023    2:03 PM 05/08/2023    9:43 AM 04/14/2023   11:23 AM 01/27/2023   10:41 AM 09/07/2022   10:07 AM 07/25/2022    9:46 AM  Depression screen PHQ 2/9  Decreased Interest 3 0 3 0 0 3 1  Down, Depressed, Hopeless 3 0 3 0 0 3 1  PHQ - 2 Score 6 0 6 0 0 6 2  Altered sleeping      3   Tired, decreased energy      3   Change in appetite      2   Feeling bad or failure about yourself       1   Trouble concentrating      2   Moving slowly or fidgety/restless      1   Suicidal thoughts      0   PHQ-9 Score      18   Difficult doing work/chores      Very difficult       Review of Systems  Musculoskeletal:  Positive for gait problem and myalgias.  All other systems reviewed and are negative.     Objective:   Physical Exam  Awake, alert, appropriate, in manual w/c; BMI 47.35; wife accompanying pt, NAD Teardrop shaped wound and had mesh removed in 2nd pic-   Still a dressing on R foot. Per pt    Assessment &  Plan:   Pt is a 48 yr old male with COVID ICU myopathy, Long COVID,  s/p surgery on feet due to necrosis from long ICU stay/pressors to save life-s/p  skin grafts and R foot osteomyelitis as well. Occurred 12/20.  Also has L foot drop. Still w/c dependent- still cannot put weight on feet per Plastics.   Here in f/u for critical illness polyneuropathy and wounds on feet B/L    Needs to get/stay off L foot except for bathroom. Per Producer, television/film/video.   2.  Tries to discuss has to  stay off foot AS MUCH AS POSSIBLE!   3. Can get a power foldable w/c on [employer]- around $1800.  Weight 316 lbs.   4.  Got oral drug screen at last appt- so not due today- however not bringing bottle to appointments- per clinic policy.    5.  Has Norco- last filled 11/24- doesn't need refill- has 20+ left.    6. Not taking Lamictal from Psych NP.    7. Needs a therapist AND someone who writes meds- so Avelina Laine is appropriate to see as well.    8.  Needs either to prescribe Flomax which shrinks the prostate OR referral to Urology. Needs this referral or treatment.    9.  PCP needs to check Vit D level and see if you are at a good level- the goal is 40-100- My goal for patients is ~ 50-60-    10. He is aware he is prediabetic- 6.0- work on this- will help healing.  Try to cut out carbs and sodas. Is eating at fast food- 3-4 days/week for breakfast per wife- 80% is diet not just exercise.      11.  F/U in 3 months double visit-    I spent a total of 34    minutes on total care today- >50% coordination of care- due to  d/w pt about weight gain; and prediabetic and to stay off feet right now-

## 2023-11-06 ENCOUNTER — Ambulatory Visit: Payer: PPO | Admitting: Physical Medicine and Rehabilitation

## 2023-11-07 ENCOUNTER — Encounter: Payer: PPO | Admitting: Physician Assistant

## 2023-11-08 ENCOUNTER — Ambulatory Visit (INDEPENDENT_AMBULATORY_CARE_PROVIDER_SITE_OTHER): Admitting: Plastic Surgery

## 2023-11-08 DIAGNOSIS — R0902 Hypoxemia: Secondary | ICD-10-CM | POA: Diagnosis not present

## 2023-11-08 DIAGNOSIS — T148XXA Other injury of unspecified body region, initial encounter: Secondary | ICD-10-CM

## 2023-11-08 DIAGNOSIS — Z9889 Other specified postprocedural states: Secondary | ICD-10-CM

## 2023-11-08 DIAGNOSIS — G4730 Sleep apnea, unspecified: Secondary | ICD-10-CM | POA: Diagnosis not present

## 2023-11-08 NOTE — Progress Notes (Signed)
 [name] returns today approximately 3 weeks postop from placement of myriad tissue matrix on the heel of his left foot.  He is doing well with no pain or discomfort.  The wound appears smaller.  There is a nice healthy granulation base in the wound.  We discussed the continued healing process.  I suspect especially where this is in the type of tissue that is on the heel that it would be a very slow recovery process.  I have encouraged him to start using his heel offloading shoe and begin ambulating.  Will continue the current dressings.  Follow-up with me in 2 weeks

## 2023-11-21 ENCOUNTER — Telehealth: Payer: Self-pay

## 2023-11-21 ENCOUNTER — Ambulatory Visit: Payer: PPO | Admitting: Physician Assistant

## 2023-11-21 DIAGNOSIS — T148XXA Other injury of unspecified body region, initial encounter: Secondary | ICD-10-CM

## 2023-11-21 NOTE — Telephone Encounter (Signed)
 Faxed wound supply order to [facility]  with demographics, insurance card and most recent ov note. Received fax success confirmation. Forwarded to front desk for batch scanning.

## 2023-11-21 NOTE — Progress Notes (Signed)
 Patient is a pleasant 48 year old male s/p debridement of chronic nonhealing left foot plantar surface wound with placement of myriad performed 10/17/2023 by Dr. Carolynne Citron who presents to clinic for postoperative follow-up.   He was last seen here in clinic on 11/08/2023.  At that time, the wound was making good progress.  Healthy granulation noted to the base of the wound.  Recommended that he begin his heel offloading shoe and begin ambulating.  Today, he is doing okay.  He is companied by his wife at bedside.  He is understandably frustrated with chronicity of his wound healing, but understands that this is a slow process.  He is continuing with the Adaptic and K-Y jelly dressing changes followed by 4 x 4 gauze and secured with Kerlix and Ace wrap.  No significant pain or systemic symptoms.  On exam, wound measures approximately 4 x 2 cm.  Good granulation within the wound bed, rolled borders along periphery.  Mild amount of slough at periphery.  No evidence concerning for infection.  He reports that he was running out of dressing supplies.  [facility]  order completed and faxed.  He understands that this will continue to take time to heal and that if it stalls or stagnates, may ultimately require flap for definitive management.  Follow-up in 2 to 3 weeks, sooner if needed.  Picture(s) obtained of the patient and placed in the chart were with the patient's or guardian's permission.

## 2023-11-22 DIAGNOSIS — L97512 Non-pressure chronic ulcer of other part of right foot with fat layer exposed: Secondary | ICD-10-CM | POA: Diagnosis not present

## 2023-11-28 ENCOUNTER — Telehealth (HOSPITAL_BASED_OUTPATIENT_CLINIC_OR_DEPARTMENT_OTHER): Payer: Self-pay | Admitting: Pulmonary Disease

## 2023-11-28 DIAGNOSIS — G4734 Idiopathic sleep related nonobstructive alveolar hypoventilation: Secondary | ICD-10-CM

## 2023-11-28 NOTE — Telephone Encounter (Signed)
 Overnight Oximetry Results Date: 11/08/23 SpO2 <88% 5 hour 25 min 10 sec.  [place] SpO2 46%  Qualified for oxygen   Assessment/Plan Nocturnal Hypoxemia --RECERTIFY/INCREASE oxygen  to 3L O2 via [place] nightly --I will order SPLIT NIGHT STUDY --Patient has been updated regarding plan

## 2023-11-29 ENCOUNTER — Other Ambulatory Visit (HOSPITAL_BASED_OUTPATIENT_CLINIC_OR_DEPARTMENT_OTHER): Payer: Self-pay

## 2023-11-29 DIAGNOSIS — G4734 Idiopathic sleep related nonobstructive alveolar hypoventilation: Secondary | ICD-10-CM

## 2023-11-29 NOTE — Telephone Encounter (Signed)
 Order placed

## 2023-12-04 ENCOUNTER — Ambulatory Visit (INDEPENDENT_AMBULATORY_CARE_PROVIDER_SITE_OTHER): Admitting: Physician Assistant

## 2023-12-04 VITALS — BP 136/77 | HR 86

## 2023-12-04 DIAGNOSIS — T148XXA Other injury of unspecified body region, initial encounter: Secondary | ICD-10-CM

## 2023-12-04 DIAGNOSIS — S91302D Unspecified open wound, left foot, subsequent encounter: Secondary | ICD-10-CM | POA: Diagnosis not present

## 2023-12-04 NOTE — Progress Notes (Signed)
 Patient is a pleasant 48 year old male s/p debridement of chronic nonhealing left foot plantar surface wound with placement of myriad performed 10/17/2023 by Dr. Carolynne Citron who presents to clinic for postoperative follow-up.   He was last seen here in clinic 11/21/2023.  At that time, wound measured approximately 4 x 2 cm.  Good granulation noted within the wound bed.  Rolled borders along periphery.  No evidence concerning for infection.  [facility]  order was completed and faxed given that he was running in his dressing supplies.  He understands that it would take time to heal, but if it stalls or stagnates he may require flap for definitive management.  Today, patient is doing okay.  He states that he has continued with the dressing changes at home, as directed.  No significant changes they can appreciate.  On exam, the wound on dorsum of left foot remains largely unchanged from last encounter.  Measures approximately 4 x 2 cm with good granulation at base.  However, the edges appear a bit macerated and rolled.  No advancing epithelialization or wound shrinkage noted.  Any healing appears to have stalled, appears somewhat similar to preoperative state.  No surrounding erythema or induration.  No significant malodor.  Will have patient follow-up with Dr. Carolynne Citron in 3 weeks.  At this time, do not feel as though the myriad placement will of succeeded in treating his chronic wound.  He did understand that this was a more conservative approach and that he very well may require flap or free tissue transfer for definitive management.  He has been following with wound care clinic for several years receiving debridement and wound care treatments to no avail.  He even has a persistent nickel sized wound on the dorsum of his right foot.  Patient to follow-up with the surgeon, appreciative of our efforts.  Picture(s) obtained of the patient and placed in the chart were with the patient's or guardian's permission.

## 2023-12-06 ENCOUNTER — Ambulatory Visit (HOSPITAL_BASED_OUTPATIENT_CLINIC_OR_DEPARTMENT_OTHER): Admitting: Pulmonary Disease

## 2023-12-06 ENCOUNTER — Telehealth: Payer: Self-pay | Admitting: Plastic Surgery

## 2023-12-06 NOTE — Telephone Encounter (Signed)
 Has a question, can pt cut grass on his riding lawnmower when it hot outside would getting sweaty be an issue, it that safe patient asked. He wanted to make sure if it could effect his wound. Pt asked for a call back.

## 2023-12-06 NOTE — Telephone Encounter (Signed)
 Called and spoke with patient.

## 2023-12-19 ENCOUNTER — Ambulatory Visit: Admitting: Allergy & Immunology

## 2023-12-21 ENCOUNTER — Other Ambulatory Visit: Payer: Self-pay

## 2023-12-21 ENCOUNTER — Ambulatory Visit: Admitting: Allergy & Immunology

## 2023-12-21 VITALS — BP 140/80 | HR 82 | Temp 98.8°F | Resp 18 | Ht 68.66 in | Wt 323.2 lb

## 2023-12-21 DIAGNOSIS — J3089 Other allergic rhinitis: Secondary | ICD-10-CM

## 2023-12-21 DIAGNOSIS — J4540 Moderate persistent asthma, uncomplicated: Secondary | ICD-10-CM | POA: Diagnosis not present

## 2023-12-21 DIAGNOSIS — L209 Atopic dermatitis, unspecified: Secondary | ICD-10-CM | POA: Diagnosis not present

## 2023-12-21 DIAGNOSIS — J302 Other seasonal allergic rhinitis: Secondary | ICD-10-CM

## 2023-12-21 MED ORDER — FLUOCINOLONE ACETONIDE BODY 0.01 % EX OIL: 1.0000 | TOPICAL_OIL | Freq: Every day | CUTANEOUS | 1 refills | Status: AC | PRN

## 2023-12-21 MED ORDER — TRIAMCINOLONE ACETONIDE 55 MCG/ACT NA AERO: 1.0000 | INHALATION_SPRAY | Freq: Every morning | NASAL | 1 refills | Status: AC

## 2023-12-21 MED ORDER — ALBUTEROL SULFATE HFA 108 (90 BASE) MCG/ACT IN AERS: INHALATION_SPRAY | RESPIRATORY_TRACT | 2 refills | Status: DC

## 2023-12-21 MED ORDER — BREZTRI AEROSPHERE 160-9-4.8 MCG/ACT IN AERO: 2.0000 | INHALATION_SPRAY | Freq: Two times a day (BID) | RESPIRATORY_TRACT | 1 refills | Status: DC

## 2023-12-21 MED ORDER — SPACER/AERO-HOLDING CHAMBERS DEVI: 1.0000 | 1 refills | Status: AC

## 2023-12-21 MED ORDER — CETIRIZINE HCL 10 MG PO TABS: 10.0000 mg | ORAL_TABLET | Freq: Every day | ORAL | 1 refills | Status: DC | PRN

## 2023-12-21 MED ORDER — IPRATROPIUM BROMIDE 0.06 % NA SOLN: 1.0000 | Freq: Three times a day (TID) | NASAL | 1 refills | Status: AC

## 2023-12-21 NOTE — Patient Instructions (Addendum)
 1. Moderate persistent asthma, uncomplicated - Lung testing looks very stable. .  - We are not going to make any medication changes at this time. - Try to get the Breztri  in twice daily.  - Daily controller medication(s): Breztri  two puffs twice daily with spacer - Prior to physical activity: albuterol  2 puffs 10-15 minutes before physical activity. - Rescue medications: albuterol  4 puffs every 4-6 hours as needed - Asthma control goals:  * Full participation in all desired activities (may need albuterol  before activity) * Albuterol  use two time or less a week on average (not counting use with activity) * Cough interfering with sleep two time or less a month * Oral steroids no more than once a year * No hospitalizations  2. Allergic rhinitis - well controlled (grass, weeds, ragweed, trees, dust mites, cat, and dog) - Continue with Nasacort  one spray per nostril daily. - Continue with Atrovent  nasal spray one spray per nostril every 8 hours as needed for runny nose.  - Continue with cetirizine  10mg  daily.  3. Dermatitis on neck/scalp - Continue with fluocinolone  oil twice daily as needed. - You can use this over the rest of the body as well.   4. Return in about 6 months (around 06/22/2024). You can have the follow up appointment with Dr. Idolina Maker or a Nurse Practicioner (our Nurse Practitioners are excellent and always have Physician oversight!).    Please inform us  of any Emergency Department visits, hospitalizations, or changes in symptoms. Call us  before going to the ED for breathing or allergy symptoms since we might be able to fit you in for a sick visit. Feel free to contact us  anytime with any questions, problems, or concerns.  It was a pleasure to see you again today! Good luck to your son!   Websites that have reliable patient information: 1. American Academy of Asthma, Allergy, and Immunology: www.aaaai.org 2. Food Allergy Research and Education (FARE): foodallergy.org 3.  Mothers of Asthmatics: http://www.asthmacommunitynetwork.org 4. American College of Allergy, Asthma, and Immunology: www.acaai.org      "Like" us  on Facebook and Instagram for our latest updates!      A healthy democracy works best when [employer] participate! Make sure you are registered to vote! If you have moved or changed any of your contact information, you will need to get this updated before voting! Scan the QR codes below to learn more!

## 2023-12-21 NOTE — Progress Notes (Signed)
 FOLLOW UP  Date of Service/Encounter:  12/21/23   Assessment:   Moderate persistent asthma, uncomplicated   Absolute eosinophil count of 200 (August 2023)   Perennial and seasonal allergic rhinitis (grass, weeds, ragweed, trees, dust mites, cat, and dog)   Prolonged hospitalization for COVID19 requiring ECMO in late 2020   Fully vaccinated against COVID19  Plan/Recommendations:   1. Moderate persistent asthma, uncomplicated - Lung testing looks very stable. .  - We are not going to make any medication changes at this time. - Try to get the Breztri  in twice daily.  - Daily controller medication(s): Breztri  two puffs twice daily with spacer - Prior to physical activity: albuterol  2 puffs 10-15 minutes before physical activity. - Rescue medications: albuterol  4 puffs every 4-6 hours as needed - Asthma control goals:  * Full participation in all desired activities (may need albuterol  before activity) * Albuterol  use two time or less a week on average (not counting use with activity) * Cough interfering with sleep two time or less a month * Oral steroids no more than once a year * No hospitalizations  2. Allergic rhinitis - well controlled (grass, weeds, ragweed, trees, dust mites, cat, and dog) - Continue with Nasacort  one spray per nostril daily. - Continue with Atrovent  nasal spray one spray per nostril every 8 hours as needed for runny nose.  - Continue with cetirizine  10mg  daily.  3. Dermatitis on neck/scalp - Continue with fluocinolone  oil twice daily as needed. - You can use this over the rest of the body as well.   4. Return in about 6 months (around 06/22/2024). You can have the follow up appointment with Dr. Idolina Maker or a Nurse Practicioner (our Nurse Practitioners are excellent and always have Physician oversight!).    Subjective:   [name] is a 48 y.o. male presenting today for follow up of  Chief Complaint  Patient presents with   Asthma     Asthma has been doing well.    [name] has a history of the following: Patient Active Problem List   Diagnosis Date Noted   Hyperlipidemia 07/18/2023   Anginal pain (HCC)    Anxiety    Asthma    Dyspnea    Headache    History of kidney stones    HTN (hypertension)    Preop cardiovascular exam    Type 2 diabetes mellitus without complication, without long-term current use of insulin  (HCC) 10/18/2022   Vitamin D  deficiency 10/18/2022   Other fatigue 09/07/2022   SOBOE (shortness of breath on exertion) 09/07/2022   Nocturnal hypoxemia 09/07/2022   Mood disorder (HCC) 09/07/2022   Ulcer of left foot (HCC) 09/07/2022   Depression 08/23/2022   Prediabetes 08/23/2022   Obstructive sleep apnea 01/11/2022   Insomnia due to other mental disorder 12/06/2021   Left foot drop 09/06/2021   Wheelchair dependence 06/04/2021   Encounter for therapeutic drug monitoring 09/29/2020   COVID-19 long hauler manifesting chronic dyspnea 09/15/2020   Morbid obesity (HCC) with starting BMI 46 09/15/2020   Encounter for attention to tracheostomy (HCC) 09/15/2020   Critical illness polyneuropathy (HCC) 09/15/2020   Moderate protein-calorie malnutrition (HCC) 09/15/2020   Pyogenic inflammation of bone (HCC) 09/09/2020   Long COVID 09/09/2020   Acute osteomyelitis (HCC) 08/31/2020   MSSA (methicillin susceptible Staphylococcus aureus) infection 08/31/2020   Reactive depression 07/01/2020   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   Impaired sensation to light touch 04/15/2020   Diffuse  alveolar damage (HCC) 03/09/2020   Eschar of foot    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   History of COVID-19    Pressure injury of skin 08/22/2019   Need for emotional support    Chest tube in place    Personal history of ECMO    Advanced care planning/counseling discussion    Palliative care by specialist    Goals of care, counseling/discussion    Advanced directives,  counseling/discussion    Subcutaneous crepitus    Thrombocytopenia (HCC)    Leukopenia    Pneumonia 07/2019   Fever    Gram positive bacterial infection    Septic arthritis of right acromioclavicular joint (HCC) 09/29/2017   Chronic left-sided low back pain with left-sided sciatica 09/19/2017   Epigastric pain    Primary hypertension 04/17/2017   Moderate persistent asthma 07/21/2015   Allergic rhinoconjunctivitis 07/21/2015   GERD (gastroesophageal reflux disease) 07/21/2015   Abnormal gait 11/12/2009   TARSAL TUNNEL SYNDROME, LEFT 10/08/2009   PES PLANUS 10/08/2009    History obtained from: chart review and patient.  Discussed the use of AI scribe software for clinical note transcription with the patient and/or guardian, who gave verbal consent to proceed.  [name] is a 48 y.o. male presenting for a follow up visit. He was last seen in September 2024. At that time, lung testing looked much better compared to the last time. We continued with Breztri  two puffs BID as well as albuterol  as needed. We did discuss doing Tezspire  for long term control. For his allergic rhinitis, we continued with Nasacort  one spray per nostril daily. We also continued with Atrovent  nasal sprays one spray per nostril daily. We also continued with cetirizine  daily. We continued with the use of fluocinolone  oil.   Since last visit, he has done fairly well.  Asthma/Respiratory Symptom History: His asthma symptoms have remained stable without the use of Fasenra, an injectable medication he was previously on. He describes his asthma as 'about the same' without it. He continues to use Breztri  2 puffs twice daily, but occasionally misses doses, aiming to take it at least once a day. He notices a slight difference in symptoms when he only takes it once a day. He last used his rescue inhaler the previous night.  He does not feel like he needs a biologic at this point.  He uses nighttime oxygen  at three liters, as his  oxygen  levels were noted to drop into the forties during a previous sleep study while on two liters.  This was prescribed by pulmonology.   Allergic Rhinitis Symptom History: Nasal symptoms are well-controlled with Nasacort  as well as Atrovent  and cetirizine .  He has never been on allergy shots.  His symptoms have never been severe enough to warrant allergy shots.   He is currently on full disability and has been for four and a half years. He occasionally visits his old workplace but has not been there recently. His ability to return to work is dependent on the condition of his legs, which are still healing from injuries.  He really does aim to go back to work, but his ulcer on his left foot has taken years to heal.  His son is going to be attending community college.  Otherwise, there have been no changes to his past medical history, surgical history, family history, or social history.    Review of systems otherwise negative other than that mentioned in the HPI.    Objective:   Blood pressure (!) 140/80, pulse  82, temperature 98.8 F (37.1 C), temperature source Temporal, resp. rate 18, height 5' 8.66" (1.744 m), weight (!) 323 lb 3.2 oz (146.6 kg), SpO2 94%. Body mass index is 48.2 kg/m.    Physical Exam Vitals reviewed.  Constitutional:      Appearance: He is well-developed.     Comments: Not in a wheelchair today.  Comfortable appearing.  HENT:     Head: Normocephalic and atraumatic.     Right Ear: Tympanic membrane, ear canal and external ear normal.     Left Ear: Tympanic membrane, ear canal and external ear normal.     Nose: No nasal deformity, septal deviation, nasal tenderness, mucosal edema, congestion or rhinorrhea.     Right Turbinates: Enlarged, swollen and pale.     Left Turbinates: Enlarged, swollen and pale.     Right Sinus: No maxillary sinus tenderness or frontal sinus tenderness.     Left Sinus: No maxillary sinus tenderness or frontal sinus tenderness.      Comments: No nasal polyps.    Mouth/Throat:     Lips: Pink.     Mouth: Mucous membranes are moist. Mucous membranes are not pale and not dry.     Pharynx: Uvula midline.     Comments: Cobblestoning present in the posterior oropharynx.  Eyes:     General: Lids are normal. Allergic shiner present.        Right eye: No discharge.        Left eye: No discharge.     Conjunctiva/sclera: Conjunctivae normal.     Right eye: Right conjunctiva is not injected. No chemosis.    Left eye: Left conjunctiva is not injected. No chemosis.    Pupils: Pupils are equal, round, and reactive to light.  Cardiovascular:     Rate and Rhythm: Normal rate and regular rhythm.     Heart sounds: Normal heart sounds.  Pulmonary:     Effort: Pulmonary effort is normal. No tachypnea, accessory muscle usage or respiratory distress.     Breath sounds: Normal breath sounds. No wheezing, rhonchi or rales.     Comments: Moving air well in the upper fields, but not in the lower fields. Chest:     Chest wall: No tenderness.  Lymphadenopathy:     Cervical: No cervical adenopathy.  Skin:    General: Skin is warm.     Capillary Refill: Capillary refill takes less than 2 seconds.     Coloration: Skin is not pale.     Findings: Wound present. No abrasion, erythema, petechiae or rash. Rash is not papular, urticarial or vesicular.     Comments: He has some redness over the back of the neck as extending into the  scalp.   Neurological:     Mental Status: He is alert.  Psychiatric:        Behavior: Behavior is cooperative.      Diagnostic studies:    Spirometry: results abnormal (FEV1: 2.54/69%, FVC: 3.20/69%, FEV1/FVC: 79%).    Spirometry consistent with possible restrictive disease.   Allergy Studies: none       Drexel Gentles, MD  [facility] 

## 2023-12-26 ENCOUNTER — Other Ambulatory Visit: Payer: Self-pay | Admitting: Behavioral Health

## 2023-12-26 ENCOUNTER — Encounter: Payer: Self-pay | Admitting: Allergy & Immunology

## 2023-12-26 DIAGNOSIS — F331 Major depressive disorder, recurrent, moderate: Secondary | ICD-10-CM

## 2023-12-26 DIAGNOSIS — F411 Generalized anxiety disorder: Secondary | ICD-10-CM

## 2023-12-26 DIAGNOSIS — F39 Unspecified mood [affective] disorder: Secondary | ICD-10-CM

## 2023-12-28 ENCOUNTER — Ambulatory Visit (INDEPENDENT_AMBULATORY_CARE_PROVIDER_SITE_OTHER): Admitting: Plastic Surgery

## 2023-12-28 ENCOUNTER — Encounter: Payer: Self-pay | Admitting: Plastic Surgery

## 2023-12-28 VITALS — BP 161/82 | HR 79 | Ht 68.5 in | Wt 323.0 lb

## 2023-12-28 DIAGNOSIS — Z9889 Other specified postprocedural states: Secondary | ICD-10-CM

## 2023-12-28 DIAGNOSIS — T148XXA Other injury of unspecified body region, initial encounter: Secondary | ICD-10-CM

## 2023-12-28 NOTE — Progress Notes (Signed)
 [name] returns today for evaluation of his left heel.  The wound is clean with healthy appearing tissue but there has been no significant improvement in the size of the wound.  We discussed this lack of healing at length today.  He understands that use of the myriad matrix was a long shot to try to heal this wound without a more complicated procedure.  I believe at this point we have given at a reasonable try and he should be considered for free tissue transfer for resurfacing the heel.  As we do not have the resources for free tissue transfer in [place] is requested referral to [facility] for evaluation.   I will place this today.  He may follow-up as needed.

## 2024-01-08 DIAGNOSIS — J4540 Moderate persistent asthma, uncomplicated: Secondary | ICD-10-CM | POA: Diagnosis not present

## 2024-01-19 ENCOUNTER — Ambulatory Visit: Admitting: Physical Medicine and Rehabilitation

## 2024-02-01 ENCOUNTER — Ambulatory Visit: Admitting: Plastic Surgery

## 2024-02-01 DIAGNOSIS — S91302D Unspecified open wound, left foot, subsequent encounter: Secondary | ICD-10-CM

## 2024-02-01 DIAGNOSIS — S91301D Unspecified open wound, right foot, subsequent encounter: Secondary | ICD-10-CM | POA: Diagnosis not present

## 2024-02-01 DIAGNOSIS — T148XXA Other injury of unspecified body region, initial encounter: Secondary | ICD-10-CM

## 2024-02-01 NOTE — Progress Notes (Signed)
 [name] returns today for evaluation of his bilateral heel wounds.  He has not been to the tertiary care center for further evaluation for possible free tissue transfer.  He is up ambulating and states that this is going well.  On examination the wounds are clean but there is no evidence of further healing.  Dressings were changed without difficulty.  Will follow-up on his consult to [facility] for evaluation for possible further treatment.  Follow-up in 1 month.

## 2024-02-07 DIAGNOSIS — J4540 Moderate persistent asthma, uncomplicated: Secondary | ICD-10-CM | POA: Diagnosis not present

## 2024-02-13 ENCOUNTER — Telehealth: Payer: Self-pay

## 2024-02-13 NOTE — Telephone Encounter (Signed)
 Patient states the referral we sent externally is out of network and wants to know if Dr. Waddell has any other recommendations of another specialist who treats.  Patient also stated he was going to be self-pay but they would not allow him to be due to him having insurance.

## 2024-02-16 ENCOUNTER — Encounter: Payer: Self-pay | Admitting: Physical Medicine and Rehabilitation

## 2024-02-16 ENCOUNTER — Encounter: Attending: Physical Medicine and Rehabilitation | Admitting: Physical Medicine and Rehabilitation

## 2024-02-16 VITALS — BP 144/84 | HR 80 | Ht 68.5 in | Wt 325.0 lb

## 2024-02-16 DIAGNOSIS — M21372 Foot drop, left foot: Secondary | ICD-10-CM | POA: Diagnosis not present

## 2024-02-16 DIAGNOSIS — G894 Chronic pain syndrome: Secondary | ICD-10-CM | POA: Diagnosis not present

## 2024-02-16 DIAGNOSIS — G7281 Critical illness myopathy: Secondary | ICD-10-CM | POA: Diagnosis not present

## 2024-02-16 DIAGNOSIS — U099 Post covid-19 condition, unspecified: Secondary | ICD-10-CM | POA: Diagnosis not present

## 2024-02-16 DIAGNOSIS — S91309A Unspecified open wound, unspecified foot, initial encounter: Secondary | ICD-10-CM | POA: Insufficient documentation

## 2024-02-16 DIAGNOSIS — M21371 Foot drop, right foot: Secondary | ICD-10-CM | POA: Insufficient documentation

## 2024-02-16 NOTE — Patient Instructions (Signed)
 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 Occurred 12/20.  Also has L foot drop. Still w/c dependent- still cannot put weight on feet per Plastics.  Here in f/u for critical illness polyneuropathy and wounds on feet B/L    Will reach out to Dr Rockey Essex-  about possible other interventions.  He's at [facility]- he takes care of all my Spinal cord injuries.   2.  Wound has stopped healing.  Needs other types of Plastic Surgery?-   3. Is Prediabetic- will need to get BG's under better levels.    4. We discussed wound care and exercise in depth- and how to get Cbg's and weight doing better- wound won't heal if doesn't make a change.    5. Is also gaining weight still- we discussed ways to work on this- making good choices, except sweets. Eat protein with sweets- like peanut M&M's. And eat a few- not the choice of sweets after every dinner   6. F/U in 3 months- since doesn't take pain meds much.   7. Needs to do Urine drug screen- since has been too long- doesn't take pain meds much- took 1 last week and before than weeks prior.   8  I agree with weight loss surgery-

## 2024-02-16 NOTE — Progress Notes (Signed)
 Subjective:    Patient ID: [name] Shoulder, male    DOB: [birth year], 48 y.o.   MRN: [id number]  HPI  Pt is a 48 yr old male with COVID ICU myopathy, Long COVID,  s/p surgery on feet due to necrosis from long ICU stay/pressors to save life-s/p  skin grafts and R foot osteomyelitis as well. Occurred 12/20.  Also has L foot drop. Still w/c dependent- still cannot put weight on feet per Plastics.  Here in f/u for critical illness polyneuropathy and wounds on feet B/L     Surgery didn't take as well as hoped- didn't heal over like they expected. Has stopped healing- completely.   Was still putting weight on it-  doing less weight on it that didn't before, but was still standing to go to bathroom; transfer to w/c and go to bathroom- was doing own dressings- himself.  As instructed.   Went to see surgeon, Dr Waddell- was referred to [facility] for additional treatment- hasn't heard from them. They aren't in network.   Hasn't thought about [facility].   BMI pretty stable- up 3 lbs since last visit in May 2025.  But up 9 lbs since last visit with me-    Has been trying to eat better last 2 weeks  Is trying to walk more- walking outside the house- has a culdesac- 2-3x/this week- trying to increase circle around culdesac.  Son following with w/c-  does 1 lap, then rests; and then does again.    Took BP meds this AM- 144/84-  Expensive to eat healthy.  Making protein shake instead of breakfast.    Wearing Hoka's- likes them- very comfortable.   Social Hx: wife and pt split up 1 month ago.  Getting better with it but still upset.  She moved out-  and pt and son is still in house  Has moved next door- with neighbors.    Pain Inventory Average Pain 6 Pain Right Now 5 My pain is constant, sharp, burning, and tingling  In the last 24 hours, has pain interfered with the following? General activity 5 Relation with others 5 Enjoyment of life 8 What TIME of day is your pain at its worst?  evening Sleep (in general) Poor  Pain is worse with: walking, bending, and standing Pain improves with: rest and medication Relief from Meds: 4  Family History  Problem Relation Age of Onset   Migraines Mother    GER disease Mother    Heart disease Father    Hyperlipidemia Father    Diabetes Father    Hypertension Father    Sleep apnea Father    Obesity Father    Asthma Brother    Pancreatic cancer Maternal Grandmother    Heart disease Maternal Grandfather    Diabetes Paternal Grandmother    Hypertension Paternal Grandmother    Social History   Socioeconomic History   Marital status: Married    Spouse name: Not on file   Number of children: 1   Years of education: Not on file   Highest education level: Not on file  Occupational History   Occupation: delivery driver   Occupation: Stay at home spouse  Tobacco Use   Smoking status: Never   Smokeless tobacco: Never  Vaping Use   Vaping status: Never Used  Substance and Sexual Activity   Alcohol  use: Yes    Comment: rarely 2-3 times a year   Drug use: No   Sexual activity: Yes  Partners: Female  Other Topics Concern   Not on file  Social History Narrative   Not on file   Social Drivers of Health   Financial Resource Strain: Not on file  Food Insecurity: Not on file  Transportation Needs: Not on file  Physical Activity: Not on file  Stress: Not on file  Social Connections: Not on file   Past Surgical History:  Procedure Laterality Date   APPLICATION OF WOUND VAC Left 10/17/2023   Procedure: Left plantar surface wound preparation and myriad placement;  Surgeon: Waddell Leonce NOVAK, MD;  Location: [facility];  Service: Plastics;  Laterality: Left;   CANNULATION FOR ECMO (EXTRACORPOREAL MEMBRANE OXYGENATION) N/A 08/28/2019   Procedure: CANNULATION FOR VV ECMO (EXTRACORPOREAL MEMBRANE OXYGENATION);  Surgeon: Fleeta Ochoa, Maude, MD;  Location: [facility] OR;  Service: Open Heart Surgery;  Laterality: N/A;  CRESCENT CANNULA    CANNULATION FOR ECMO (EXTRACORPOREAL MEMBRANE OXYGENATION) N/A 09/10/2019   Procedure: CANNULATION FOR ECMO (EXTRACORPOREAL MEMBRANE OXYGENATION) PUTTING IN CRESCENT 32FR CANNULA  AND REMOVING GROING CANNULATION;  Surgeon: German Bartlett PEDLAR, MD;  Location: [facility];  Service: Open Heart Surgery;  Laterality: N/A;  PUTTING IN CRESCENT/REMOVING GROIN CANNULATION   CYSTOSCOPY/URETEROSCOPY/HOLMIUM LASER/STENT PLACEMENT Left 04/18/2019   Procedure: LEFT URETEROSCOPY/HOLMIUM LASER/STENT PLACEMENT;  Surgeon: Cam Morene ORN, MD;  Location: [facility];  Service: Urology;  Laterality: Left;   CYSTOSCOPY/URETEROSCOPY/HOLMIUM LASER/STENT PLACEMENT Left 05/02/2019   Procedure: CYSTOSCOPY/URETEROSCOPY/HOLMIUM LASER/STENT EXCHANGE;  Surgeon: Cam Morene ORN, MD;  [facility]: [facility];  Service: Urology;  Laterality: Left;   ECMO CANNULATION N/A 08/03/2019   Procedure: ECMO CANNULATION;  Surgeon: German Bartlett PEDLAR, MD;  [facility];  Service: Cardiovascular;  Laterality: N/A;   ESOPHAGOGASTRODUODENOSCOPY N/A 09/11/2019   Procedure: ESOPHAGOGASTRODUODENOSCOPY (EGD);  Surgeon: German Bartlett PEDLAR, MD;  Location: [facility] OR;  Service: Thoracic;  Laterality: N/A;   GRAFT APPLICATION Bilateral 03/27/2020   Procedure: APPLICATION OF SKIN GRAFT BILATERAL FEET;  Surgeon: Janit Thresa HERO, DPM;  Location: [facility];  Service: Podiatry;  Laterality: Bilateral;   IR REPLC GASTRO/COLONIC TUBE PERCUT W/FLUORO  10/14/2019   IRRIGATION AND DEBRIDEMENT SHOULDER Right 09/29/2017   Procedure: IRRIGATION AND DEBRIDEMENT SHOULDER;  Surgeon: Jerri Kay HERO, MD;  [facility];  Service: Orthopedics;  Laterality: Right;   LUMBAR DISC SURGERY  2002   NASAL ENDOSCOPY WITH EPISTAXIS CONTROL N/A 08/31/2019   Procedure: NASAL ENDOSCOPY WITH EPISTAXIS CONTROL WITH CAUTERIZATION;  Surgeon: Carlie Clark, MD;  Location: [facility] OR;  Service: ENT;  Laterality: N/A;   PORT-A-CATH REMOVAL     PORTACATH PLACEMENT N/A 09/11/2019    Procedure: PEG TUBE INSERTION - BEDSIDE;  Surgeon: German Bartlett PEDLAR, MD;  [facility];  Service: Thoracic;  Laterality: N/A;   TEE WITHOUT CARDIOVERSION N/A 08/28/2019   Procedure: TRANSESOPHAGEAL ECHOCARDIOGRAM (TEE);  Surgeon: Fleeta Ochoa, Maude, MD;  Location: [facility] OR;  Service: Open Heart Surgery;  Laterality: N/A;   TEE WITHOUT CARDIOVERSION N/A 09/10/2019   Procedure: TRANSESOPHAGEAL ECHOCARDIOGRAM (TEE);  Surgeon: German Bartlett PEDLAR, MD;  Location: [facility] OR;  Service: Open Heart Surgery;  Laterality: N/A;   WOUND DEBRIDEMENT Bilateral 03/27/2020   Procedure: EXCISIONAL DEBRIDEMENT OF ULCERS BILATERAL FEET;  Surgeon: Janit Thresa HERO, DPM;  [facility];  Service: Podiatry;  Laterality: Bilateral;   Past Surgical History:  Procedure Laterality Date   APPLICATION OF WOUND VAC Left 10/17/2023   Procedure: Left plantar surface wound preparation and myriad placement;  Surgeon: Waddell Leonce NOVAK, MD;  [facility]: [facility];  Service: Government social research officer;  Laterality: Left;   CANNULATION FOR ECMO (EXTRACORPOREAL MEMBRANE OXYGENATION) N/A 08/28/2019   Procedure: CANNULATION FOR VV ECMO (EXTRACORPOREAL MEMBRANE OXYGENATION);  Surgeon: Fleeta Ochoa, Maude, MD;  Location: [facility] OR;  Service: Open Heart Surgery;  Laterality: N/A;  CRESCENT CANNULA   CANNULATION FOR ECMO (EXTRACORPOREAL MEMBRANE OXYGENATION) N/A 09/10/2019   Procedure: CANNULATION FOR ECMO (EXTRACORPOREAL MEMBRANE OXYGENATION) PUTTING IN CRESCENT 32FR CANNULA  AND REMOVING GROING CANNULATION;  Surgeon: German Bartlett PEDLAR, MD;  [facility];  Service: Open Heart Surgery;  Laterality: N/A;  PUTTING IN CRESCENT/REMOVING GROIN CANNULATION   CYSTOSCOPY/URETEROSCOPY/HOLMIUM LASER/STENT PLACEMENT Left 04/18/2019   Procedure: LEFT URETEROSCOPY/HOLMIUM LASER/STENT PLACEMENT;  Surgeon: Cam Morene ORN, MD;  Location: [facility];  Service: Urology;  Laterality: Left;   CYSTOSCOPY/URETEROSCOPY/HOLMIUM LASER/STENT PLACEMENT Left 05/02/2019    Procedure: CYSTOSCOPY/URETEROSCOPY/HOLMIUM LASER/STENT EXCHANGE;  Surgeon: Cam Morene ORN, MD;  [facility];  Service: Urology;  Laterality: Left;   ECMO CANNULATION N/A 08/03/2019   Procedure: ECMO CANNULATION;  Surgeon: German Bartlett PEDLAR, MD;  [facility];  Service: Cardiovascular;  Laterality: N/A;   ESOPHAGOGASTRODUODENOSCOPY N/A 09/11/2019   Procedure: ESOPHAGOGASTRODUODENOSCOPY (EGD);  Surgeon: German Bartlett PEDLAR, MD;  Location: [facility] OR;  Service: Thoracic;  Laterality: N/A;   GRAFT APPLICATION Bilateral 03/27/2020   Procedure: APPLICATION OF SKIN GRAFT BILATERAL FEET;  Surgeon: Janit Thresa HERO, DPM;  [facility];  Service: Podiatry;  Laterality: Bilateral;   IR REPLC GASTRO/COLONIC TUBE PERCUT W/FLUORO  10/14/2019   IRRIGATION AND DEBRIDEMENT SHOULDER Right 09/29/2017   Procedure: IRRIGATION AND DEBRIDEMENT SHOULDER;  Surgeon: Jerri Kay HERO, MD;  [facility];  Service: Orthopedics;  Laterality: Right;   LUMBAR DISC SURGERY  2002   NASAL ENDOSCOPY WITH EPISTAXIS CONTROL N/A 08/31/2019   Procedure: NASAL ENDOSCOPY WITH EPISTAXIS CONTROL WITH CAUTERIZATION;  Surgeon: Carlie Clark, MD;  Location: [facility] OR;  Service: ENT;  Laterality: N/A;   PORT-A-CATH REMOVAL     PORTACATH PLACEMENT N/A 09/11/2019   Procedure: PEG TUBE INSERTION - BEDSIDE;  Surgeon: German Bartlett PEDLAR, MD;  [facility];  Service: Thoracic;  Laterality: N/A;   TEE WITHOUT CARDIOVERSION N/A 08/28/2019   Procedure: TRANSESOPHAGEAL ECHOCARDIOGRAM (TEE);  Surgeon: Fleeta Ochoa, Maude, MD;  Location: [facility] OR;  Service: Open Heart Surgery;  Laterality: N/A;   TEE WITHOUT CARDIOVERSION N/A 09/10/2019   Procedure: TRANSESOPHAGEAL ECHOCARDIOGRAM (TEE);  Surgeon: German Bartlett PEDLAR, MD;  Location: [facility] OR;  Service: Open Heart Surgery;  Laterality: N/A;   WOUND DEBRIDEMENT Bilateral 03/27/2020   Procedure: EXCISIONAL DEBRIDEMENT OF ULCERS BILATERAL FEET;  Surgeon: Janit Thresa HERO, DPM;  Location: [facility];   Service: Podiatry;  Laterality: Bilateral;   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   Critical illness polyneuropathy (HCC) 09/15/2020   Depression    Diffuse alveolar damage (HCC) 03/09/2020   Dyspnea    Encounter for attention to tracheostomy (HCC) 09/15/2020   Encounter for therapeutic  drug monitoring 09/29/2020   Epigastric pain    Eschar of foot    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   Fever    GERD (gastroesophageal reflux disease)    Goals of care, counseling/discussion    Gram positive bacterial infection    Headache    History of COVID-19    History of kidney stones    LEFT URETERAL STONE   HTN (hypertension)    Hyperlipidemia 07/18/2023   Impaired sensation to light touch 04/15/2020   Insomnia due to other mental disorder 12/06/2021   Left foot drop 09/06/2021   Leukopenia    Long COVID 09/09/2020   Moderate persistent asthma 07/21/2015   Moderate protein-calorie malnutrition (HCC) 09/15/2020   Mood disorder (HCC) 09/07/2022   Morbid obesity (HCC) with starting BMI 46 09/15/2020   MSSA (methicillin susceptible Staphylococcus aureus) infection 08/31/2020   Need for emotional support    Nocturnal hypoxemia 09/07/2022   Obstructive sleep apnea 01/11/2022   nocturnal hypoxemia, uses 2L O2 at night   On home O2    2L at night   Other fatigue 09/07/2022   Palliative care by specialist    Personal history of ECMO    PES  PLANUS 10/08/2009   Qualifier: Diagnosis of   By: Cleatrice MD, Ludie      Replacing diagnoses that were inactivated after the 11/07/22 regulatory import     Pneumonia 07/2019   covid   Prediabetes    Preop cardiovascular exam    Pressure injury of skin 08/22/2019   Primary hypertension 04/17/2017   Pyogenic inflammation of bone (HCC) 09/09/2020   Reactive depression 07/01/2020   Septic arthritis of right acromioclavicular joint (HCC) 09/29/2017   SOBOE (shortness of breath on exertion)    Subcutaneous crepitus    TARSAL TUNNEL SYNDROME, LEFT 10/08/2009   Qualifier: Diagnosis of   By: Cleatrice MD, Shane         Thrombocytopenia (HCC)    Type 2 diabetes mellitus without complication, without long-term current use of insulin  (HCC) 10/18/2022   Ulcer of left foot (HCC) 09/07/2022   Vitamin D  deficiency 10/18/2022   Wheelchair dependence 06/04/2021   BP (!) 144/84   Pulse 80   Ht 5' 8.5 (1.74 m)   Wt (!) 325 lb (147.4 kg)   SpO2 93%   BMI 48.70 kg/m   Opioid Risk Score:   Fall Risk Score:  `1  Depression screen [facility] 2/9     02/16/2024   10:05 AM 08/07/2023   10:46 AM 06/27/2023    2:03 PM 05/08/2023    9:43 AM 04/14/2023   11:23 AM 01/27/2023   10:41 AM 09/07/2022   10:07 AM  Depression screen PHQ 2/9  Decreased Interest 2 3 0 3 0 0 3  Down, Depressed, Hopeless 2 3 0 3 0 0 3  PHQ - 2 Score 4 6 0 6 0 0 6  Altered sleeping       3  Tired, decreased energy       3  Change in appetite       2  Feeling bad or failure about yourself        1  Trouble concentrating       2  Moving slowly or fidgety/restless       1  Suicidal thoughts       0  PHQ-9 Score       18  Difficult doing work/chores  Very difficult     Review of Systems  Musculoskeletal:  Positive for arthralgias.       Multiple joint pain  All other systems reviewed and are negative.      Objective:   Physical Exam  Awake, alert, depressed affect; in transport w/c; NAD Wearing Hoka's Accompanied by son,  not wife Santina over picture of wound-       Assessment & Plan:   Pt is a 48 yr old male with COVID ICU myopathy, Long COVID,  s/p surgery on feet due to necrosis from long ICU stay/pressors to save life-s/p  skin grafts and R foot osteomyelitis as well. Occurred 12/20.  Also has L foot drop. Still w/c dependent- still cannot put weight on feet per Plastics.  Here in f/u for critical illness polyneuropathy and wounds on feet B/L    Will reach out to Dr Rockey Essex-  about possible other interventions.  He's at [facility]- he takes care of all my Spinal cord injuries.   2.  Wound has stopped healing.  Needs other types of Plastic Surgery?-   3. Is Prediabetic- will need to get BG's under better levels.    4. We discussed wound care and exercise in depth- and how to get Cbg's and weight doing better- wound won't heal if doesn't make a change.    5. Is also gaining weight still- we discussed ways to work on this- making good choices, except sweets. Eat protein with sweets- like peanut M&M's. And eat a few- not the choice of sweets after every dinner   6. F/U in 3 months- since doesn't take pain meds much.   7. Needs to do Urine drug screen- since has been too long- doesn't take pain meds much- took 1 last week and before than weeks prior.   8  I agree with weight loss surgery-   I spent a total of 31   minutes on total care today- >50% coordination of care- due to d/w pt about weight, about blood sugars as well as weight loss surgery- and pain meds- wait on pain meds- has enough per pt

## 2024-02-21 ENCOUNTER — Ambulatory Visit (HOSPITAL_BASED_OUTPATIENT_CLINIC_OR_DEPARTMENT_OTHER): Attending: Pulmonary Disease | Admitting: Internal Medicine

## 2024-02-21 DIAGNOSIS — G4736 Sleep related hypoventilation in conditions classified elsewhere: Secondary | ICD-10-CM | POA: Diagnosis not present

## 2024-02-21 DIAGNOSIS — G4734 Idiopathic sleep related nonobstructive alveolar hypoventilation: Secondary | ICD-10-CM | POA: Diagnosis not present

## 2024-02-21 DIAGNOSIS — G4733 Obstructive sleep apnea (adult) (pediatric): Secondary | ICD-10-CM | POA: Diagnosis not present

## 2024-02-23 ENCOUNTER — Other Ambulatory Visit: Payer: Self-pay | Admitting: Allergy & Immunology

## 2024-02-24 NOTE — Procedures (Signed)
 [facility]ryle [facility] [address] [place], [place] [address] Tel: [phone]   Fax: [phone]  Polysomnography Interpretation  Patient Name:  [name], [name] Date:  02/21/2024 Referring Physician:  CHI ELLISON [phone]) %% Indications for Polysomnography The patient is a 48 year old Male who is 5' 9 and weighs 326.0 lbs. His BMI equals 48.8.  A full night polysomnogram was performed to evaluate for -.OSA  Medications taken at 2135.  TRAZODONE   SINGULAIR   LOPRESSOR   NORCO   Polysomnogram Data A full night polysomnogram recorded the standard physiologic parameters including EEG, EOG, EMG, EKG, nasal and oral airflow.  Respiratory parameters of chest and abdominal movements were recorded with Respiratory Inductance Plethysmography belts.  Oxygen  saturation was recorded by pulse oximetry.   Sleep Architecture The total recording time of the polysomnogram was 430.6 minutes.  The total sleep time was 246.0 minutes.  The patient spent 3.9% of total sleep time in Stage N1, 87.0% in Stage N2, 0.0% in Stages N3, and 9.1% in REM.  Sleep latency was 83.0 minutes.  REM latency was 158.5 minutes.  Sleep Efficiency was 57.1%.  Wake after Sleep Onset time was 102.0 minutes.  Respiratory Events The polysomnogram revealed a presence of 77 obstructive, 5 central, and 2 mixed apneas resulting in an Apnea index of 20.5 events per hour.  There were 189 hypopneas (>=3% desaturation and/or arousal) resulting in an Apnea\Hypopnea Index (AHI >=3% desaturation and/or arousal) of 66.6 events per hour.  There were 156 hypopneas (>=4% desaturation) resulting in an Apnea\Hypopnea Index (AHI >=4% desaturation) of 58.5 events per hour.  There were 35 Respiratory Effort Related Arousals resulting in a RERA index of 8.5 events per hour. The Respiratory Disturbance Index is 75.1 events per hour.  The snore index was - events per hour.  Mean oxygen  saturation was 87.9%.  The  lowest oxygen  saturation during sleep was 61.0%.  Time spent <=88% oxygen  saturation was 217.2 minutes (50.7%).  Limb Activity There were - total limb movements recorded, of this total, - were classified as PLMs.  PLM index was - per hour and PLM associated with Arousals index was - per hour.  Cardiac Summary The average pulse rate was 65.1 bpm.  The minimum pulse rate was 46.0 bpm while the maximum pulse rate was 92.0 bpm.  Cardiac rhythm was normal.  Comment: Severe obstructive sleep apnea, AHI (3%) 66.6/hr. Snoring with oxygen  desaturation to a nadir of 61%, mean 87.9%.  Because of sustained oxygen  saturation 88% and below during the first hour of the study, while patient remained awake, supplemental O2 was added per protocol at 1L/min. and continued through the study, reflecting Nocturnal Hypoxemia.  Patient had significant difficulty initiating and maintaining sleep, with sustained sleep onset no obtained until 01:20AM, sleep efficiency 57%..  Diagnosis: Obstructive sleep apnea. Nocturnal Hypoxemia.  Recommendations: Suggest autopap 5-20 or CPAP titration sleep study. Assess need for supplemental O2. Assess need for sleep aid if insomnia is a persistent problem.   This study was personally reviewed and electronically signed by: NEYSA REGGY BIRCH., MD Accredited Board Certified in Sleep Medicine Date/Time: 02/24/24   %%   Diagnostic PSG Report  Patient Name: [name], [name] Date: 02/21/2024  Date of Birth: 11/03/1975 Study Type: Diagnostic  Age: 59 year MRN #: [id number]  Sex: Male Interpreting Physician: NEYSA REGGY, [id number]  Height: 5' 9 Referring Physician: CHI ELLISON [id number])  Weight: 326.0 lbs Recording Tech: Charlie George RPSGT  BMI: 48.8 Scoring Tech: Charlie George  RPSGT  ESS: 11 Neck Size: 20  Mask Type ResMed AirFit F20 FFM    Mask Size: Medium Supplemental O2: 1 LPM   Study Overview  Lights Off: 09:52:32 PM  Count Index  Lights On: 05:03:11 AM Awakenings:  15 3.7  Time in Bed: 430.6 min. Arousals: 171 41.7  Total Sleep Time: 246.0 min. AHI (>=3% Desat and/or Ar.): 273 66.6   Sleep Efficiency: 57.1% AHI (>=4% Desat): 240 58.5   Sleep Latency: 83.0 min. Limb Movements: - -  Wake After Sleep Onset: 102.0 min. Snore: - -  REM Latency from Sleep Onset: 158.5 min. Desaturations: 263 64.1     Minimum SpO2 TST: 61.0%    Sleep Architecture  % of Time in Bed Stages Time (mins) % Sleep Time  Wake 185.0   Stage N1 9.5 3.9%  Stage N2 214.0 87.0%  Stage N3 0.0 0.0%  REM 22.5 9.1%   Arousal Summary   NREM REM Sleep Index  Respiratory Arousals 150 17 167 40.7  PLM Arousals - - - -  Isolated Limb Movement Arousals - - - -  Snore Arousals - - - -  Spontaneous Arousals 4 - 4 1.0  Total 154 17 171 41.7   Limb Movement Summary   Count Index  Isolated Limb Movements - -  Periodic Limb Movements (PLMs) - -  Total Limb Movements - -    Respiratory Summary   By Sleep Stage By Body Position Total   NREM REM Supine Non-Supine   Time (min) 223.5 22.5 5.0 241.0 246.0         Obstructive Apnea 62 15 - 77 77  Mixed Apnea 1 1 - 2 2  Central Apnea 3 2 - 5 5  Total Apneas 66 18 - 84 84  Total Apnea Index 17.7 48.0 - 20.9 20.5         Hypopneas (>=3% Desat and/or Ar.) 182 7 12 177 189  AHI (>=3% Desat and/or Ar.) 66.6 66.7 144.0 65.0 66.6         Hypopneas (>=4% Desat) 149 7 9 147 156  AHI (>=4% Desat) 57.7 66.7 108.0 57.5 58.5          RERAs 34 1 1 34 35  RERA Index 9.1 2.7 12.0 8.5 8.5         RDI 75.7 69.3 156.0 73.4 75.1    Respiratory Event Type Index  Central Apneas 1.2  Obstructive Apneas 18.8  Mixed Apneas 0.5  Central Hypopneas -  Obstructive Hypopneas 45.6  Central Apnea + Hypopnea (CAHI) 1.2  Obstructive Apnea + Hypopnea (OAHI) 65.4   Respiratory Event Durations   Apnea Hypopnea   NREM REM NREM REM  Average (seconds) 19.2 30.5 27.1 23.5  Maximum (seconds) 55.8 68.8 59.3 35.2    Oxygen  Saturation Summary   Wake NREM  REM TST TIB  Average SpO2 (%) 87.9% 88.2% 84.3% 87.9% 87.9%  Minimum SpO2 (%) 76.0% 62.0% 61.0% 61.0% 61.0%  Maximum SpO2 (%) 96.0% 95.0% 93.0% 95.0% 96.0%   Oxygen  Saturation Distribution  Range (%) Time in range (min) Time in range (%)  90.0 - 100.0 84.5 19.7%  80.0 - 90.0 315.1 73.6%  70.0 - 80.0 13.3 3.1%  60.0 - 70.0 1.9 0.4%  50.0 - 60.0 - -  0.0 - 50.0 - -  Time Spent <=88% SpO2  Range (%) Time in range (min) Time in range (%)  0.0 - 88.0 217.2 50.7%      Count Index  Desaturations 263  64.1    Cardiac Summary   Wake NREM REM Sleep Total  Average Pulse Rate (BPM) 72.6 60.3 55.8 59.9 65.1  Minimum Pulse Rate (BPM) 46.0 46.0 47.0 46.0 46.0  Maximum Pulse Rate (BPM) 92.0 85.0 82.0 85.0 92.0   Pulse Rate Distribution:  Range (bpm) Time in range (min) Time in range (%)  0.0 - 40.0 - -  40.0 - 60.0 150.4 35.0%  60.0 - 80.0 247.9 57.8%  80.0 - 100.0 17.2 4.0%  100.0 - 120.0 - -  120.0 - 140.0 - -  140.0 - 200.0 - -   Supplemental O2 Summary  O2 Level Time (min) Wake (min) NREM (min) REM (min) Sleep Eff% OA# CA# MA# Hyp# (>=3%) AHI (>=3%) Hyp# (>=4%) AHI (>=%4) RERA RDI SpO2 <=88% (min) Min SpO2 Mean SpO2 Ar. Index  - 72.5 72.5 0.0 0.0 0.0%               O2: 1 358.5 112.5 223.5 22.5 68.6% 77 5 2 189 66.6 156  58.5 35  75.1  109.2 61.0 87.9 41.7   Hypnograms                      Technologist Comments  Patient arrived for a split night study. The patient was placed in room 4. The procedure was explained, and no questions were asked. The patient was fitted for a mask and trialed CPAP prior to hookup at a pressure of 6 cm H2O with an EPR of 2.  The patient did not have enough TST to meet split night criteria. The patient was restless for the first half of the study. During the first hour of recording, while the patient was still and in wake, his saturations stayed at 88% and below. Because of this, 1 LPM of oxygen  was added to bring saturations to  acceptable levels. The patient slept in the left, right, supine, and upright positions. Oral venting was observed. No PLMs, bruxism, seizure, or spike wave activity was observed. Snoring was moderate. PACs and PVCs were observed. Stages N1, N2, and REM were recorded. The patient had a lot of difficulty falling asleep and might benefit from a sleep aid should he need to return for another study.                          Reggy Salt Diplomate, Biomedical engineer of Sleep Medicine  ELECTRONICALLY SIGNED ON:  02/24/2024, 11:34 AM [facility] PH: [phone]   FX: [phone] ACCREDITED BY THE AMERICAN ACADEMY OF SLEEP MEDICINE

## 2024-02-29 ENCOUNTER — Ambulatory Visit: Admitting: Plastic Surgery

## 2024-02-29 DIAGNOSIS — S91302D Unspecified open wound, left foot, subsequent encounter: Secondary | ICD-10-CM | POA: Diagnosis not present

## 2024-02-29 DIAGNOSIS — T148XXA Other injury of unspecified body region, initial encounter: Secondary | ICD-10-CM

## 2024-02-29 DIAGNOSIS — S91301D Unspecified open wound, right foot, subsequent encounter: Secondary | ICD-10-CM

## 2024-02-29 NOTE — Progress Notes (Signed)
 Mr. Birchard returns again today to evaluate the wounds on his feet.  He states he has been ambulating more and has found it more comfortable with his new shoes.  Minimal discomfort.  He is not had an appointment for evaluation for possible flap closure of the wounds.  He states that he has an appointment at [facility] coming up.  On evaluation the wounds actually appear smaller today than they have in the past.  There is no evidence of infection.  We discussed continued activity as I do not believe this is going to hurt his feet.  We also discussed the importance of exercise and an exercise bike may be a useful adjunct for him.  Follow-up in 1 to 2 months.

## 2024-03-05 DIAGNOSIS — L97422 Non-pressure chronic ulcer of left heel and midfoot with fat layer exposed: Secondary | ICD-10-CM | POA: Diagnosis not present

## 2024-03-05 DIAGNOSIS — L97412 Non-pressure chronic ulcer of right heel and midfoot with fat layer exposed: Secondary | ICD-10-CM | POA: Diagnosis not present

## 2024-03-05 DIAGNOSIS — L97522 Non-pressure chronic ulcer of other part of left foot with fat layer exposed: Secondary | ICD-10-CM | POA: Diagnosis not present

## 2024-03-05 DIAGNOSIS — G629 Polyneuropathy, unspecified: Secondary | ICD-10-CM | POA: Diagnosis not present

## 2024-03-05 DIAGNOSIS — L97512 Non-pressure chronic ulcer of other part of right foot with fat layer exposed: Secondary | ICD-10-CM | POA: Diagnosis not present

## 2024-03-05 DIAGNOSIS — I878 Other specified disorders of veins: Secondary | ICD-10-CM | POA: Diagnosis not present

## 2024-03-09 DIAGNOSIS — J4540 Moderate persistent asthma, uncomplicated: Secondary | ICD-10-CM | POA: Diagnosis not present

## 2024-03-13 ENCOUNTER — Telehealth (HOSPITAL_BASED_OUTPATIENT_CLINIC_OR_DEPARTMENT_OTHER): Payer: Self-pay | Admitting: Pulmonary Disease

## 2024-03-13 DIAGNOSIS — G4733 Obstructive sleep apnea (adult) (pediatric): Secondary | ICD-10-CM

## 2024-03-13 NOTE — Addendum Note (Signed)
 Addended by: Eilee Schader L on: 03/13/2024 11:58 AM   Modules accepted: Orders

## 2024-03-13 NOTE — Telephone Encounter (Signed)
 [facility] Telephone Encounter  Reviewed in-lab sleep study on 02/21/24.  Impression: Severe obstructive sleep apnea, AHI (3%) 66.6/hr. Snoring with oxygen  desaturation to a nadir of 61%, mean 87.9%.  Because of sustained oxygen  saturation 88% and below during the first hour of the study, while patient remained awake, supplemental O2 was added per protocol at 1L/min. and continued through the study, reflecting Nocturnal Hypoxemia.     Recommendations: Suggest autopap 5-20 or CPAP titration sleep study. Assess need for supplemental O2. Assess need for sleep aid if insomnia is a persistent problem.  Plan: After telephone discussion with patient, he wishes to proceed with autoPAP  --Please order auto CPAP 5-20cm H20 with supplies. He is a NEW START --Please schedule patient for follow-up with me in November/December for CPAP follow-up. --Remind patient to contact our office if he has not been contacted by his DME in 4-6 weeks.

## 2024-03-13 NOTE — Telephone Encounter (Signed)
Order placed for CPAP.

## 2024-03-18 ENCOUNTER — Telehealth (HOSPITAL_BASED_OUTPATIENT_CLINIC_OR_DEPARTMENT_OTHER): Payer: Self-pay

## 2024-03-18 NOTE — Telephone Encounter (Signed)
 Spoke with pt and instructed him this is a process that requires insurance approval and it does take some time I did tell pt as the order states Avelina from [employer] verified that they received it on 03/14/2024 @ 10:11am. It could take them a couple of weeks for processing   Copied from CRM #[id number]. Topic: Clinical - Order For Equipment >> Mar 18, 2024  2:48 PM Rozanna MATSU wrote: Reason for CRM: PT CALLED BACK STATED HE SPOKE WITH [facility] AND THEY DO NOT HAVE HIS PRESCRIPTION FOR HIS SUPPLIES AND CPAP MACHINE. PT WOULD LIKE SOMEONE TO CONTACT HIM IN REGARDS TO THIS THANKS >> Mar 18, 2024  3:07 PM Almarie PENNER wrote: Warren, you're going to have to call him. He was given the [employer] number and made aware that it can take several weeks before a CPAP is ready.

## 2024-03-18 NOTE — Telephone Encounter (Signed)
 Spoke with pt and instructed him this is a process that requires insurance approval and it does take some time I did tell pt as the order states Avelina from [employer] verified that they received it on 03/14/2024 @ 10:11am. It could take them a couple of weeks for processing   Copied from CRM #[id number]. Topic: Clinical - Order For Equipment >> Mar 18, 2024  2:48 PM Rozanna MATSU wrote: Reason for CRM: PT CALLED BACK STATED HE SPOKE WITH [facility] AND THEY DO NOT HAVE HIS PRESCRIPTION FOR HIS SUPPLIES AND CPAP MACHINE. PT WOULD LIKE SOMEONE TO CONTACT HIM IN REGARDS TO THIS THANKS >> Mar 18, 2024  3:07 PM Almarie PENNER wrote: [name], you're going to have to call him. He was given the [employer] number and made aware that it can take several weeks before a CPAP is ready.

## 2024-04-03 NOTE — Progress Notes (Signed)
 Referring Provider Stephane Leita DEL, MD [address] [place],  [place] [address]   CC:  Chief Complaint  Patient presents with   Follow-up      [name] is an 48 y.o. male.  HPI: Patient is a pleasant 48 year old male s/p debridement of chronic nonhealing left foot plantar surface wound with placement of myriad performed 10/17/2023 by Dr. Waddell who presents to clinic for postoperative follow-up.   His wound healing stagnated postoperatively.  No significant improvement compared to preoperative photos.  However, he was then seen 02/29/2024 at which time they did appear to be slightly smaller.  Referrals have been made to [facility] for evaluation and consideration of flap closure.  He also appears to have seen the wound care center at [facility] 03/05/2024 for his wound.  They emphasized the importance of offloading.  Wound care supplies provided.  Today, patient is a bit frustrated, understandably, given the chronicity of his condition.  He went to the [facility] wound care center and their recommendations were similar to the wound care center he had previously gone to here in [place].  He plans to resume going there.  He states that he had been there for approximately 2 years without any considerable resolution in his wounds before coming to our clinic for surgical intervention.  Patient simply does not know where to go from here.  He tried to follow-up with [facility], as referral was placed from our office.  They informed him that he could not be scheduled due to insurance reasons.   No Known Allergies  Outpatient Encounter Medications as of 04/04/2024  Medication Sig Note   albuterol  (VENTOLIN  HFA) 108 (90 Base) MCG/ACT inhaler TAKE 2 PUFFS BY MOUTH EVERY 4 HOURS AS NEEDED    amLODipine  (NORVASC ) 5 MG tablet Take 5 mg by mouth daily.    ascorbic acid  (VITAMIN C) 500 MG tablet Take 500 mg by mouth daily.    budesonide -glycopyrrolate-formoterol  (BREZTRI  AEROSPHERE) 160-9-4.8 MCG/ACT AERO inhaler Inhale 2  puffs into the lungs in the morning and at bedtime.    cetirizine  (ZYRTEC ) 10 MG tablet Take 1 tablet (10 mg total) by mouth daily as needed for allergies (Can use an ectra dose during flare ups.).    Cholecalciferol (VITAMIN D3) 50 MCG (2000 UT) capsule Take 4,000 Units by mouth daily.    DULoxetine  (CYMBALTA ) 60 MG capsule 1 capsule Orally Once a day for 90 (Patient taking differently: Take 60 mg by mouth daily.)    Fluocinolone  Acetonide Body 0.01 % OIL Apply 1 Application topically daily as needed. Apply to neck and body as needed    gentamicin  ointment (GARAMYCIN ) 0.1 % Apply 1 Application topically daily. 10/17/2023: Used on the right foot this morning not on the left   HYDROcodone -acetaminophen  (NORCO) 5-325 MG tablet Take 1 tablet by mouth 2 (two) times daily as needed for moderate pain (pain score 4-6). 11/03/2023: Didn't bring bottle, LD 3 weeks ago   ipratropium (ATROVENT ) 0.06 % nasal spray Place 1 spray into both nostrils 3 (three) times daily.    metoprolol  tartrate (LOPRESSOR ) 25 MG tablet Take 25 mg by mouth 2 (two) times daily.    montelukast  (SINGULAIR ) 10 MG tablet TAKE 1 TABLET BY MOUTH EVERY DAY    ondansetron  (ZOFRAN -ODT) 4 MG disintegrating tablet Take 1 tablet (4 mg total) by mouth every 8 (eight) hours as needed for nausea or vomiting.    OXYGEN  Inhale 2 L into the lungs at bedtime.    pantoprazole  (PROTONIX ) 40 MG  tablet Take 40 mg by mouth daily.    sildenafil (VIAGRA) 50 MG tablet Take 50 mg by mouth as needed for erectile dysfunction. 10/17/2023: Hasn't started   Spacer/Aero-Holding Chambers DEVI 1 Device by Does not apply route as directed.    traZODone  (DESYREL ) 100 MG tablet Take 2 tablets (200 mg total) by mouth at bedtime. (Patient taking differently: Take 150 mg by mouth at bedtime.)    triamcinolone  (NASACORT ) 55 MCG/ACT AERO nasal inhaler Place 1 spray into the nose every morning.    zinc  gluconate 50 MG tablet Take 50 mg by mouth daily.    No  facility-administered encounter medications on file as of 04/04/2024.     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   Critical illness polyneuropathy (HCC) 09/15/2020   Depression    Diffuse alveolar damage (HCC) 03/09/2020   Dyspnea    Encounter for attention to tracheostomy (HCC) 09/15/2020   Encounter for therapeutic drug monitoring 09/29/2020   Epigastric pain    Eschar of foot    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   Fever    GERD (gastroesophageal reflux disease)    Goals of care, counseling/discussion    Gram positive bacterial infection    Headache    History of COVID-19    History of kidney stones    LEFT URETERAL STONE   HTN (hypertension)    Hyperlipidemia 07/18/2023   Impaired sensation to light touch 04/15/2020   Insomnia due to other mental disorder 12/06/2021   Left foot drop 09/06/2021   Leukopenia    Long COVID 09/09/2020   Moderate persistent asthma 07/21/2015   Moderate protein-calorie malnutrition (HCC) 09/15/2020   Mood disorder (HCC) 09/07/2022   Morbid obesity (HCC) with starting BMI 46 09/15/2020   MSSA (methicillin susceptible Staphylococcus aureus) infection 08/31/2020   Need for emotional support    Nocturnal hypoxemia 09/07/2022   Obstructive sleep apnea 01/11/2022   nocturnal hypoxemia, uses 2L O2 at night   On home O2    2L at night   Other fatigue 09/07/2022   Palliative care by specialist     Personal history of ECMO    PES PLANUS 10/08/2009   Qualifier: Diagnosis of   By: Cleatrice MD, Ludie      Replacing diagnoses that were inactivated after the 11/07/22 regulatory import     Pneumonia 07/2019   covid   Prediabetes    Preop cardiovascular exam    Pressure injury of skin 08/22/2019   Primary hypertension 04/17/2017   Pyogenic inflammation of bone (HCC) 09/09/2020   Reactive depression 07/01/2020   Septic arthritis of right acromioclavicular joint (HCC) 09/29/2017   SOBOE (shortness of breath on exertion)    Subcutaneous crepitus    TARSAL TUNNEL SYNDROME, LEFT 10/08/2009   Qualifier: Diagnosis of   By: Cleatrice MD, Shane         Thrombocytopenia (HCC)    Type 2 diabetes  mellitus without complication, without long-term current use of insulin  (HCC) 10/18/2022   Ulcer of left foot (HCC) 09/07/2022   Vitamin D  deficiency 10/18/2022   Wheelchair dependence 06/04/2021    Past Surgical History:  Procedure Laterality Date   APPLICATION OF WOUND VAC Left 10/17/2023   Procedure: Left plantar surface wound preparation and myriad placement;  Surgeon: Waddell Leonce NOVAK, MD;  Location: [facility];  Service: Plastics;  Laterality: Left;   CANNULATION FOR ECMO (EXTRACORPOREAL MEMBRANE OXYGENATION) N/A 08/28/2019   Procedure: CANNULATION FOR VV ECMO (EXTRACORPOREAL MEMBRANE OXYGENATION);  Surgeon: Fleeta Ochoa, Maude, MD;  Location: [facility] OR;  Service: Open Heart Surgery;  Laterality: N/A;  CRESCENT CANNULA   CANNULATION FOR ECMO (EXTRACORPOREAL MEMBRANE OXYGENATION) N/A 09/10/2019   Procedure: CANNULATION FOR ECMO (EXTRACORPOREAL MEMBRANE OXYGENATION) PUTTING IN CRESCENT 32FR CANNULA  AND REMOVING GROING CANNULATION;  Surgeon: German Bartlett PEDLAR, MD;  Location: [facility] OR;  Service: Open Heart Surgery;  Laterality: N/A;  PUTTING IN CRESCENT/REMOVING GROIN CANNULATION   CYSTOSCOPY/URETEROSCOPY/HOLMIUM LASER/STENT PLACEMENT Left 04/18/2019   Procedure: LEFT URETEROSCOPY/HOLMIUM LASER/STENT PLACEMENT;  Surgeon:  Cam Morene ORN, MD;  Location: [facility];  Service: Urology;  Laterality: Left;   CYSTOSCOPY/URETEROSCOPY/HOLMIUM LASER/STENT PLACEMENT Left 05/02/2019   Procedure: CYSTOSCOPY/URETEROSCOPY/HOLMIUM LASER/STENT EXCHANGE;  Surgeon: Cam Morene ORN, MD;  Location: [facility];  Service: Urology;  Laterality: Left;   ECMO CANNULATION N/A 08/03/2019   Procedure: ECMO CANNULATION;  Surgeon: German Bartlett PEDLAR, MD;  Location: [facility];  Service: Cardiovascular;  Laterality: N/A;   ESOPHAGOGASTRODUODENOSCOPY N/A 09/11/2019   Procedure: ESOPHAGOGASTRODUODENOSCOPY (EGD);  Surgeon: German Bartlett PEDLAR, MD;  Location: [facility] OR;  Service: Thoracic;  Laterality: N/A;   GRAFT APPLICATION Bilateral 03/27/2020   Procedure: APPLICATION OF SKIN GRAFT BILATERAL FEET;  Surgeon: Janit Thresa HERO, DPM;  Location: [facility];  Service: Podiatry;  Laterality: Bilateral;   IR REPLC GASTRO/COLONIC TUBE PERCUT W/FLUORO  10/14/2019   IRRIGATION AND DEBRIDEMENT SHOULDER Right 09/29/2017   Procedure: IRRIGATION AND DEBRIDEMENT SHOULDER;  Surgeon: Jerri Kay HERO, MD;  Location: [facility] OR;  Service: Orthopedics;  Laterality: Right;   LUMBAR DISC SURGERY  2002   NASAL ENDOSCOPY WITH EPISTAXIS CONTROL N/A 08/31/2019   Procedure: NASAL ENDOSCOPY WITH EPISTAXIS CONTROL WITH CAUTERIZATION;  Surgeon: Carlie Clark, MD;  Location: [facility] OR;  Service: ENT;  Laterality: N/A;   PORT-A-CATH REMOVAL     PORTACATH PLACEMENT N/A 09/11/2019   Procedure: PEG TUBE INSERTION - BEDSIDE;  Surgeon: German Bartlett PEDLAR, MD;  Location: [facility] OR;  Service: Thoracic;  Laterality: N/A;   TEE WITHOUT CARDIOVERSION N/A 08/28/2019   Procedure: TRANSESOPHAGEAL ECHOCARDIOGRAM (TEE);  Surgeon: Fleeta Ochoa, Maude, MD;  Location: [facility] OR;  Service: Open Heart Surgery;  Laterality: N/A;   TEE WITHOUT CARDIOVERSION N/A 09/10/2019   Procedure: TRANSESOPHAGEAL ECHOCARDIOGRAM (TEE);  Surgeon: German Bartlett PEDLAR, MD;  Location: [facility] OR;  Service: Open Heart Surgery;   Laterality: N/A;   WOUND DEBRIDEMENT Bilateral 03/27/2020   Procedure: EXCISIONAL DEBRIDEMENT OF ULCERS BILATERAL FEET;  Surgeon: Janit Thresa HERO, DPM;  Location: [facility];  Service: Podiatry;  Laterality: Bilateral;    Family History  Problem Relation Age of Onset   Migraines Mother    GER disease Mother    Heart disease Father    Hyperlipidemia Father    Diabetes Father    Hypertension Father    Sleep apnea Father    Obesity Father    Asthma Brother    Pancreatic cancer Maternal Grandmother    Heart disease Maternal  Grandfather    Diabetes Paternal Grandmother    Hypertension Paternal Grandmother     Social History   Social History Narrative   Not on file     Review of Systems General: Denies fevers or chills  Physical Exam    02/21/2024    8:31 PM 02/16/2024    9:41 AM 12/28/2023    9:24 AM  Vitals with BMI  Height 5' 8.5 5' 8.5 5' 8.5  Weight 326 lbs 325 lbs 323 lbs  BMI 48.84 48.69 48.39  Systolic  144 161  Diastolic  84 82  Pulse  80 79    General:  No acute distress, nontoxic appearing  Respiratory: No increased work of breathing Neuro: Alert and oriented Psychiatric: Normal mood and affect  Skin: Left foot wound measures 5 x 2.5 cm. Right foot wound measures 1.5 x 2 cm.       Assessment/Plan  Chronic bilateral plantar foot wounds:  Unfortunately this patient has been dealing with chronic wounds ever since incurring them while hospitalized during COVID-19 pandemic.  He failed outpatient wound care and came to our clinic for surgical consideration.  Unfortunately, the myriad tissue placement also did not yield any improvement.  Left foot wound measured 4 x 2 cm at my last visit 12/04/2023 which confirms that there has been no progression whatsoever in terms of wound healing and it has in fact regressed.  Plan at this time is still for possible flap closure with [facility].  Placed referral to [facility] per Dr. Waddell, as well.  Will have our team follow-up about  the referral that had been placed.  He can continue with outpatient wound care center in the interim, but he is afraid it will continue to stagnate.  Follow-up in 6 weeks, sooner if needed.  Continue with Xeroform, Kerlix, and offloading per WCC.  Picture(s) obtained of the patient and placed in the chart were with the patient's or guardian's permission.   Honora Seip PA-C 04/04/2024, 10:57 AM

## 2024-04-04 ENCOUNTER — Ambulatory Visit: Admitting: Physician Assistant

## 2024-04-04 ENCOUNTER — Encounter: Payer: Self-pay | Admitting: Physician Assistant

## 2024-04-04 VITALS — BP 146/105 | HR 82 | Wt 322.2 lb

## 2024-04-04 DIAGNOSIS — S91301D Unspecified open wound, right foot, subsequent encounter: Secondary | ICD-10-CM

## 2024-04-04 DIAGNOSIS — S91302D Unspecified open wound, left foot, subsequent encounter: Secondary | ICD-10-CM

## 2024-04-04 DIAGNOSIS — T148XXA Other injury of unspecified body region, initial encounter: Secondary | ICD-10-CM

## 2024-04-05 DIAGNOSIS — G4733 Obstructive sleep apnea (adult) (pediatric): Secondary | ICD-10-CM | POA: Diagnosis not present

## 2024-04-09 ENCOUNTER — Telehealth (HOSPITAL_BASED_OUTPATIENT_CLINIC_OR_DEPARTMENT_OTHER): Payer: Self-pay

## 2024-04-09 DIAGNOSIS — J4540 Moderate persistent asthma, uncomplicated: Secondary | ICD-10-CM | POA: Diagnosis not present

## 2024-04-09 NOTE — Telephone Encounter (Signed)
 CMN received for CPAP supplies signed by provider and faxed confirmation received

## 2024-04-14 ENCOUNTER — Other Ambulatory Visit: Payer: Self-pay | Admitting: Behavioral Health

## 2024-04-14 DIAGNOSIS — F411 Generalized anxiety disorder: Secondary | ICD-10-CM

## 2024-04-14 DIAGNOSIS — F331 Major depressive disorder, recurrent, moderate: Secondary | ICD-10-CM

## 2024-04-14 DIAGNOSIS — F39 Unspecified mood [affective] disorder: Secondary | ICD-10-CM

## 2024-04-14 DIAGNOSIS — F5105 Insomnia due to other mental disorder: Secondary | ICD-10-CM

## 2024-04-22 ENCOUNTER — Other Ambulatory Visit: Payer: Self-pay | Admitting: Behavioral Health

## 2024-04-22 ENCOUNTER — Other Ambulatory Visit: Payer: Self-pay | Admitting: Physical Medicine and Rehabilitation

## 2024-04-22 DIAGNOSIS — F5105 Insomnia due to other mental disorder: Secondary | ICD-10-CM

## 2024-04-22 DIAGNOSIS — F331 Major depressive disorder, recurrent, moderate: Secondary | ICD-10-CM

## 2024-04-22 DIAGNOSIS — F411 Generalized anxiety disorder: Secondary | ICD-10-CM

## 2024-04-22 DIAGNOSIS — F39 Unspecified mood [affective] disorder: Secondary | ICD-10-CM

## 2024-05-06 DIAGNOSIS — G4733 Obstructive sleep apnea (adult) (pediatric): Secondary | ICD-10-CM | POA: Diagnosis not present

## 2024-05-07 DIAGNOSIS — Z125 Encounter for screening for malignant neoplasm of prostate: Secondary | ICD-10-CM | POA: Diagnosis not present

## 2024-05-07 DIAGNOSIS — I10 Essential (primary) hypertension: Secondary | ICD-10-CM | POA: Diagnosis not present

## 2024-05-07 DIAGNOSIS — E785 Hyperlipidemia, unspecified: Secondary | ICD-10-CM | POA: Diagnosis not present

## 2024-05-07 DIAGNOSIS — E7849 Other hyperlipidemia: Secondary | ICD-10-CM | POA: Diagnosis not present

## 2024-05-07 DIAGNOSIS — Z1389 Encounter for screening for other disorder: Secondary | ICD-10-CM | POA: Diagnosis not present

## 2024-05-07 DIAGNOSIS — R7303 Prediabetes: Secondary | ICD-10-CM | POA: Diagnosis not present

## 2024-05-07 DIAGNOSIS — E559 Vitamin D deficiency, unspecified: Secondary | ICD-10-CM | POA: Diagnosis not present

## 2024-05-09 DIAGNOSIS — J4540 Moderate persistent asthma, uncomplicated: Secondary | ICD-10-CM | POA: Diagnosis not present

## 2024-05-14 DIAGNOSIS — R0609 Other forms of dyspnea: Secondary | ICD-10-CM | POA: Diagnosis not present

## 2024-05-14 DIAGNOSIS — J4540 Moderate persistent asthma, uncomplicated: Secondary | ICD-10-CM | POA: Diagnosis not present

## 2024-05-14 DIAGNOSIS — F331 Major depressive disorder, recurrent, moderate: Secondary | ICD-10-CM | POA: Diagnosis not present

## 2024-05-14 DIAGNOSIS — K219 Gastro-esophageal reflux disease without esophagitis: Secondary | ICD-10-CM | POA: Diagnosis not present

## 2024-05-14 DIAGNOSIS — I10 Essential (primary) hypertension: Secondary | ICD-10-CM | POA: Diagnosis not present

## 2024-05-14 DIAGNOSIS — Z0000 Encounter for general adult medical examination without abnormal findings: Secondary | ICD-10-CM | POA: Diagnosis not present

## 2024-05-14 DIAGNOSIS — Z1339 Encounter for screening examination for other mental health and behavioral disorders: Secondary | ICD-10-CM | POA: Diagnosis not present

## 2024-05-14 DIAGNOSIS — Z1331 Encounter for screening for depression: Secondary | ICD-10-CM | POA: Diagnosis not present

## 2024-05-14 DIAGNOSIS — Z6841 Body Mass Index (BMI) 40.0 and over, adult: Secondary | ICD-10-CM | POA: Diagnosis not present

## 2024-05-14 DIAGNOSIS — E785 Hyperlipidemia, unspecified: Secondary | ICD-10-CM | POA: Diagnosis not present

## 2024-05-14 DIAGNOSIS — J9610 Chronic respiratory failure, unspecified whether with hypoxia or hypercapnia: Secondary | ICD-10-CM | POA: Diagnosis not present

## 2024-05-14 DIAGNOSIS — G4733 Obstructive sleep apnea (adult) (pediatric): Secondary | ICD-10-CM | POA: Diagnosis not present

## 2024-05-14 DIAGNOSIS — G998 Other specified disorders of nervous system in diseases classified elsewhere: Secondary | ICD-10-CM | POA: Diagnosis not present

## 2024-05-14 DIAGNOSIS — L97529 Non-pressure chronic ulcer of other part of left foot with unspecified severity: Secondary | ICD-10-CM | POA: Diagnosis not present

## 2024-05-16 ENCOUNTER — Other Ambulatory Visit: Payer: Self-pay | Admitting: Behavioral Health

## 2024-05-16 DIAGNOSIS — F411 Generalized anxiety disorder: Secondary | ICD-10-CM

## 2024-05-16 DIAGNOSIS — F331 Major depressive disorder, recurrent, moderate: Secondary | ICD-10-CM

## 2024-05-16 DIAGNOSIS — F39 Unspecified mood [affective] disorder: Secondary | ICD-10-CM

## 2024-05-17 ENCOUNTER — Encounter: Attending: Physical Medicine and Rehabilitation | Admitting: Physical Medicine and Rehabilitation

## 2024-05-17 ENCOUNTER — Encounter: Payer: Self-pay | Admitting: Physical Medicine and Rehabilitation

## 2024-05-17 VITALS — BP 135/61 | HR 67 | Ht 68.5 in | Wt 322.0 lb

## 2024-05-17 DIAGNOSIS — G6281 Critical illness polyneuropathy: Secondary | ICD-10-CM | POA: Insufficient documentation

## 2024-05-17 DIAGNOSIS — U099 Post covid-19 condition, unspecified: Secondary | ICD-10-CM | POA: Diagnosis not present

## 2024-05-17 DIAGNOSIS — Z993 Dependence on wheelchair: Secondary | ICD-10-CM | POA: Diagnosis not present

## 2024-05-17 NOTE — Patient Instructions (Signed)
 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 Occurred 12/20.  Also has L foot drop. Still w/c dependent- still cannot put weight on feet per Plastics.  Here in f/u for critical illness polyneuropathy and wounds on feet B/L   Needs to call wound care- to get back on their schedule.    2.  Needs to see Psychiatry- to get more complex meds like Lamictal  than normal SSRI's- which we prescribe.  Need to call.   3.  I feel you are doing same things over and over again, but there's no change in results.   4. Needs to call them-Plastic Surgery about referral to [place].   5.   I suggested trying to focus on your son- has him full time- to try and change priorities-   6. F/U in 6 months- double appt - ICU myopathy  7. 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

## 2024-05-17 NOTE — Progress Notes (Signed)
 Subjective:    Patient ID: [name] Shoulder, male    DOB: 06/12/1976, 48 y.o.   MRN: [id number]  HPI  Pt is a 48 yr old male with COVID ICU myopathy, Long COVID,  s/p surgery on feet due to necrosis from long ICU stay/pressors to save life-s/p  skin grafts and R foot osteomyelitis as well. Occurred 12/20.  Also has L foot drop. Still w/c dependent- still cannot put weight on feet per Plastics.  Here in f/u for critical illness polyneuropathy and wounds on feet B/L Here for f/u on ICU myopathy and Foot wounds B/L  Has lost 4 lbs since 02/16/24- our last visit.  Now is 322 lbs- BMI 48.25   Pain is increased since last visit.   Just normal wear and tear.  Trying to do activity- and aches and pains coming back.     Mood is worse- had to stop seeing Psychiatrist-  had to stop seeing because wife has all the money since she left.   don't care she left-   Wound care- hasn't gone back to them-  Plastic surgery- said could go back to wound care.  Called 3 weeks ago- they only had certain time slots available- didn't schedule anything.   Son no longer taking class which interfered in timing.   Was having feet pain 8 years ago- steroid injections and plantar fasciitis-    Heaviest he's ever been More depressed.  Not doing much activity- depressed and lazy.     Saw Dr Stephane- his PCP- earlier in week-  she suggested Lamictal -  Pain Inventory Average Pain 7 Pain Right Now 5 My pain is sharp, stabbing, and tingling  In the last 24 hours, has pain interfered with the following? General activity 6 Relation with others 6 Enjoyment of life 8 What TIME of day is your pain at its worst? evening Sleep (in general) Fair  Pain is worse with: walking, inactivity, and standing Pain improves with: medication Relief from Meds: 7  Family History  Problem Relation Age of Onset   Migraines Mother    GER disease Mother    Heart disease Father    Hyperlipidemia Father    Diabetes  Father    Hypertension Father    Sleep apnea Father    Obesity Father    Asthma Brother    Pancreatic cancer Maternal Grandmother    Heart disease Maternal Grandfather    Diabetes Paternal Grandmother    Hypertension Paternal Grandmother    Social History   Socioeconomic History   Marital status: Married    Spouse name: Not on file   Number of children: 1   Years of education: Not on file   Highest education level: Not on file  Occupational History   Occupation: delivery driver   Occupation: Stay at home spouse  Tobacco Use   Smoking status: Never   Smokeless tobacco: Never  Vaping Use   Vaping status: Never Used  Substance and Sexual Activity   Alcohol  use: Yes    Comment: rarely 2-3 times a year   Drug use: No   Sexual activity: Yes    Partners: Female  Other Topics Concern   Not on file  Social History Narrative   Not on file   Social Drivers of Health   Financial Resource Strain: Not on file  Food Insecurity: Not on file  Transportation Needs: Not on file  Physical Activity: Not on file  Stress: Not on file  Social Connections: Not  on file   Past Surgical History:  Procedure Laterality Date   APPLICATION OF WOUND VAC Left 10/17/2023   Procedure: Left plantar surface wound preparation and myriad placement;  Surgeon: Waddell Leonce NOVAK, MD;  Location: [facility];  Service: Plastics;  Laterality: Left;   CANNULATION FOR ECMO (EXTRACORPOREAL MEMBRANE OXYGENATION) N/A 08/28/2019   Procedure: CANNULATION FOR VV ECMO (EXTRACORPOREAL MEMBRANE OXYGENATION);  Surgeon: Fleeta Ochoa, Maude, MD;  Location: [name] OR;  Service: Open Heart Surgery;  Laterality: N/A;  CRESCENT CANNULA   CANNULATION FOR ECMO (EXTRACORPOREAL MEMBRANE OXYGENATION) N/A 09/10/2019   Procedure: CANNULATION FOR ECMO (EXTRACORPOREAL MEMBRANE OXYGENATION) PUTTING IN CRESCENT 32FR CANNULA  AND REMOVING GROING CANNULATION;  Surgeon: German Bartlett PEDLAR, MD;  Location: [facility] OR;  Service: Open Heart Surgery;  Laterality:  N/A;  PUTTING IN CRESCENT/REMOVING GROIN CANNULATION   CYSTOSCOPY/URETEROSCOPY/HOLMIUM LASER/STENT PLACEMENT Left 04/18/2019   Procedure: LEFT URETEROSCOPY/HOLMIUM LASER/STENT PLACEMENT;  Surgeon: Cam Morene ORN, MD;  Location: [facility];  Service: Urology;  Laterality: Left;   CYSTOSCOPY/URETEROSCOPY/HOLMIUM LASER/STENT PLACEMENT Left 05/02/2019   Procedure: CYSTOSCOPY/URETEROSCOPY/HOLMIUM LASER/STENT EXCHANGE;  Surgeon: Cam Morene ORN, MD;  Location: [facility];  Service: Urology;  Laterality: Left;   ECMO CANNULATION N/A 08/03/2019   Procedure: ECMO CANNULATION;  Surgeon: German Bartlett PEDLAR, MD;  Location: [facility];  Service: Cardiovascular;  Laterality: N/A;   ESOPHAGOGASTRODUODENOSCOPY N/A 09/11/2019   Procedure: ESOPHAGOGASTRODUODENOSCOPY (EGD);  Surgeon: German Bartlett PEDLAR, MD;  Location: [facility] OR;  Service: Thoracic;  Laterality: N/A;   GRAFT APPLICATION Bilateral 03/27/2020   Procedure: APPLICATION OF SKIN GRAFT BILATERAL FEET;  Surgeon: Janit Thresa HERO, DPM;  Location: [facility];  Service: Podiatry;  Laterality: Bilateral;   IR REPLC GASTRO/COLONIC TUBE PERCUT W/FLUORO  10/14/2019   IRRIGATION AND DEBRIDEMENT SHOULDER Right 09/29/2017   Procedure: IRRIGATION AND DEBRIDEMENT SHOULDER;  Surgeon: Jerri Kay HERO, MD;  Location: [facility];  Service: Orthopedics;  Laterality: Right;   LUMBAR DISC SURGERY  2002   NASAL ENDOSCOPY WITH EPISTAXIS CONTROL N/A 08/31/2019   Procedure: NASAL ENDOSCOPY WITH EPISTAXIS CONTROL WITH CAUTERIZATION;  Surgeon: Carlie Clark, MD;  Location: [facility] OR;  Service: ENT;  Laterality: N/A;   PORT-A-CATH REMOVAL     PORTACATH PLACEMENT N/A 09/11/2019   Procedure: PEG TUBE INSERTION - BEDSIDE;  Surgeon: German Bartlett PEDLAR, MD;  Location: [facility];  Service: Thoracic;  Laterality: N/A;   TEE WITHOUT CARDIOVERSION N/A 08/28/2019   Procedure: TRANSESOPHAGEAL ECHOCARDIOGRAM (TEE);  Surgeon: Fleeta Ochoa, Maude, MD;  Location: [facility] OR;  Service: Open Heart  Surgery;  Laterality: N/A;   TEE WITHOUT CARDIOVERSION N/A 09/10/2019   Procedure: TRANSESOPHAGEAL ECHOCARDIOGRAM (TEE);  Surgeon: German Bartlett PEDLAR, MD;  Location: [facility] OR;  Service: Open Heart Surgery;  Laterality: N/A;   WOUND DEBRIDEMENT Bilateral 03/27/2020   Procedure: EXCISIONAL DEBRIDEMENT OF ULCERS BILATERAL FEET;  Surgeon: Janit Thresa HERO, DPM;  Location: [facility];  Service: Podiatry;  Laterality: Bilateral;   Past Surgical History:  Procedure Laterality Date   APPLICATION OF WOUND VAC Left 10/17/2023   Procedure: Left plantar surface wound preparation and myriad placement;  Surgeon: Waddell Leonce NOVAK, MD;  Location: [facility];  Service: Plastics;  Laterality: Left;   CANNULATION FOR ECMO (EXTRACORPOREAL MEMBRANE OXYGENATION) N/A 08/28/2019   Procedure: CANNULATION FOR VV ECMO (EXTRACORPOREAL MEMBRANE OXYGENATION);  Surgeon: Fleeta Ochoa, Maude, MD;  Location: [facility] OR;  Service: Open Heart Surgery;  Laterality: N/A;  CRESCENT CANNULA   CANNULATION FOR ECMO (EXTRACORPOREAL MEMBRANE OXYGENATION) N/A 09/10/2019   Procedure: CANNULATION FOR  ECMO (EXTRACORPOREAL MEMBRANE OXYGENATION) PUTTING IN CRESCENT 32FR CANNULA  AND REMOVING GROING CANNULATION;  Surgeon: German Bartlett PEDLAR, MD;  Location: [facility];  Service: Open Heart Surgery;  Laterality: N/A;  PUTTING IN CRESCENT/REMOVING GROIN CANNULATION   CYSTOSCOPY/URETEROSCOPY/HOLMIUM LASER/STENT PLACEMENT Left 04/18/2019   Procedure: LEFT URETEROSCOPY/HOLMIUM LASER/STENT PLACEMENT;  Surgeon: Cam Morene ORN, MD;  Location: [facility];  Service: Urology;  Laterality: Left;   CYSTOSCOPY/URETEROSCOPY/HOLMIUM LASER/STENT PLACEMENT Left 05/02/2019   Procedure: CYSTOSCOPY/URETEROSCOPY/HOLMIUM LASER/STENT EXCHANGE;  Surgeon: Cam Morene ORN, MD;  Location: [facility];  Service: Urology;  Laterality: Left;   ECMO CANNULATION N/A 08/03/2019   Procedure: ECMO CANNULATION;  Surgeon: German Bartlett PEDLAR, MD;  [facility];  Service:  Cardiovascular;  Laterality: N/A;   ESOPHAGOGASTRODUODENOSCOPY N/A 09/11/2019   Procedure: ESOPHAGOGASTRODUODENOSCOPY (EGD);  Surgeon: German Bartlett PEDLAR, MD;  Location: [facility] OR;  Service: Thoracic;  Laterality: N/A;   GRAFT APPLICATION Bilateral 03/27/2020   Procedure: APPLICATION OF SKIN GRAFT BILATERAL FEET;  Surgeon: Janit Thresa HERO, DPM;  Location: [facility];  Service: Podiatry;  Laterality: Bilateral;   IR REPLC GASTRO/COLONIC TUBE PERCUT W/FLUORO  10/14/2019   IRRIGATION AND DEBRIDEMENT SHOULDER Right 09/29/2017   Procedure: IRRIGATION AND DEBRIDEMENT SHOULDER;  Surgeon: Jerri Kay HERO, MD;  [facility];  Service: Orthopedics;  Laterality: Right;   LUMBAR DISC SURGERY  2002   NASAL ENDOSCOPY WITH EPISTAXIS CONTROL N/A 08/31/2019   Procedure: NASAL ENDOSCOPY WITH EPISTAXIS CONTROL WITH CAUTERIZATION;  Surgeon: Carlie Clark, MD;  Location: [facility] OR;  Service: ENT;  Laterality: N/A;   PORT-A-CATH REMOVAL     PORTACATH PLACEMENT N/A 09/11/2019   Procedure: PEG TUBE INSERTION - BEDSIDE;  Surgeon: German Bartlett PEDLAR, MD;  Location: [facility];  Service: Thoracic;  Laterality: N/A;   TEE WITHOUT CARDIOVERSION N/A 08/28/2019   Procedure: TRANSESOPHAGEAL ECHOCARDIOGRAM (TEE);  Surgeon: Fleeta Ochoa, Maude, MD;  Location: [facility] OR;  Service: Open Heart Surgery;  Laterality: N/A;   TEE WITHOUT CARDIOVERSION N/A 09/10/2019   Procedure: TRANSESOPHAGEAL ECHOCARDIOGRAM (TEE);  Surgeon: German Bartlett PEDLAR, MD;  Location: [facility] OR;  Service: Open Heart Surgery;  Laterality: N/A;   WOUND DEBRIDEMENT Bilateral 03/27/2020   Procedure: EXCISIONAL DEBRIDEMENT OF ULCERS BILATERAL FEET;  Surgeon: Janit Thresa HERO, DPM;  Location: [facility];  Service: Podiatry;  Laterality: Bilateral;   Past Medical History:  Diagnosis Date   Abnormal gait 11/12/2009   Qualifier: Diagnosis of   By: Cleatrice MD, Shane         Acute osteomyelitis (HCC) 08/31/2020   Advanced care planning/counseling discussion    Advanced directives,  counseling/discussion    Allergic rhinoconjunctivitis 07/21/2015   Anginal pain    with covid   Anxiety    Asthma    Chest tube in place    Chronic left-sided low back pain with left-sided sciatica 09/19/2017   COVID-19 long hauler manifesting chronic dyspnea 09/15/2020   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   Critical illness polyneuropathy 09/15/2020   Depression    Diffuse alveolar damage (HCC) 03/09/2020   Dyspnea    Encounter for attention to tracheostomy (HCC) 09/15/2020   Encounter for therapeutic drug monitoring 09/29/2020   Epigastric pain    Eschar of foot    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   Fever    GERD (gastroesophageal reflux disease)    Goals of care, counseling/discussion    Gram positive bacterial infection    Headache    History of COVID-19    History  of kidney stones    LEFT URETERAL STONE   HTN (hypertension)    Hyperlipidemia 07/18/2023   Impaired sensation to light touch 04/15/2020   Insomnia due to other mental disorder 12/06/2021   Left foot drop 09/06/2021   Leukopenia    Long COVID 09/09/2020   Moderate persistent asthma 07/21/2015   Moderate protein-calorie malnutrition 09/15/2020   Mood disorder 09/07/2022   Morbid obesity (HCC) with starting BMI 46 09/15/2020   MSSA (methicillin susceptible Staphylococcus aureus) infection 08/31/2020   Need for emotional support    Nocturnal hypoxemia 09/07/2022   Obstructive sleep apnea 01/11/2022   nocturnal hypoxemia, uses 2L O2 at night   On home O2    2L at night   Other fatigue 09/07/2022   Palliative care by specialist    Personal history of ECMO    PES PLANUS 10/08/2009   Qualifier: Diagnosis of   By: Cleatrice MD, Ludie      Replacing diagnoses that were inactivated after the 11/07/22 regulatory import     Pneumonia 07/2019   covid   Prediabetes    Preop cardiovascular exam    Pressure injury of skin 08/22/2019   Primary hypertension 04/17/2017    Pyogenic inflammation of bone (HCC) 09/09/2020   Reactive depression 07/01/2020   Septic arthritis of right acromioclavicular joint (HCC) 09/29/2017   SOBOE (shortness of breath on exertion)    Subcutaneous crepitus    TARSAL TUNNEL SYNDROME, LEFT 10/08/2009   Qualifier: Diagnosis of   By: Cleatrice MD, Shane         Thrombocytopenia    Type 2 diabetes mellitus without complication, without long-term current use of insulin  (HCC) 10/18/2022   Ulcer of left foot (HCC) 09/07/2022   Vitamin D  deficiency 10/18/2022   Wheelchair dependence 06/04/2021   BP 135/61 (BP Location: Left Arm, Patient Position: Sitting, Cuff Size: Large)   Pulse 67   Ht 5' 8.5 (1.74 m)   Wt (!) 322 lb (146.1 kg)   SpO2 91%   BMI 48.25 kg/m   Opioid Risk Score:   Fall Risk Score:  `1  Depression screen [facility] 2/9     05/17/2024   11:10 AM 02/16/2024   10:05 AM 08/07/2023   10:46 AM 06/27/2023    2:03 PM 05/08/2023    9:43 AM 04/14/2023   11:23 AM 01/27/2023   10:41 AM  Depression screen PHQ 2/9  Decreased Interest 1 2 3  0 3 0 0  Down, Depressed, Hopeless 1 2 3  0 3 0 0  PHQ - 2 Score 2 4 6  0 6 0 0  Altered sleeping 1        Tired, decreased energy 2        Change in appetite 0        Feeling bad or failure about yourself  0        Trouble concentrating 0        Moving slowly or fidgety/restless 0        Suicidal thoughts 0        PHQ-9 Score 5        Difficult doing work/chores Somewhat difficult            Review of Systems  Musculoskeletal:  Positive for joint swelling.       Bilateral shoulder, knee, foot, wrist and hip pain, lower back pain  All other systems reviewed and are negative.      Objective:   Physical Exam  Awake, alert, appropriate,  very depressed; accompanied by son who's has headphones on; in transport w/c, no acute distress Weight 322 lbs today  O2 sats 91% BP 135/61- HR 67 Taking deep breaths- appears  comfortable resp wise        Assessment & Plan:   Pt is a 48 yr  old male with COVID ICU myopathy, Long COVID,  s/p surgery on feet due to necrosis from long ICU stay/pressors to save life-s/p  skin grafts and R foot osteomyelitis as well. Occurred 12/20.  Also has L foot drop. Still w/c dependent- still cannot put weight on feet per Plastics.  Here in f/u for critical illness polyneuropathy and wounds on feet B/L   Needs to call wound care- to get back on their schedule.    2.  Needs to see Psychiatry- to get more complex meds like Lamictal  than normal SSRI's- which we prescribe.  Need to call.   3.  I feel you are doing same things over and over again, but there's no change in results.   4. Needs to call them-Plastic Surgery about referral to [place].   5.   I suggested trying to focus on your son- has him full time- to try and change priorities-   6. F/U in 6 months- double appt - ICU myopathy  7. 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      I spent a total of 27   minutes on total care today- >50% coordination of care- due to d/w pt about making changes- and getting wound care, plastics and BG's- and needs to make calls to get these things done.

## 2024-05-22 ENCOUNTER — Ambulatory Visit: Admitting: Physician Assistant

## 2024-05-22 ENCOUNTER — Encounter: Payer: Self-pay | Admitting: Physician Assistant

## 2024-05-22 VITALS — BP 148/83 | HR 75 | Ht 68.5 in | Wt 322.0 lb

## 2024-05-22 DIAGNOSIS — S91302D Unspecified open wound, left foot, subsequent encounter: Secondary | ICD-10-CM | POA: Diagnosis not present

## 2024-05-22 DIAGNOSIS — S91301D Unspecified open wound, right foot, subsequent encounter: Secondary | ICD-10-CM | POA: Diagnosis not present

## 2024-05-22 DIAGNOSIS — T148XXA Other injury of unspecified body region, initial encounter: Secondary | ICD-10-CM

## 2024-05-22 NOTE — Progress Notes (Signed)
 Referring Provider Stephane Leita DEL, MD [address] [place],  [place] [address]   CC:  Chief Complaint  Patient presents with   Follow-up    - F/U BL dorsum foot wound 6 weeks with PA,       [name] is an 48 y.o. male.  HPI: Patient is a pleasant 48 year old male s/p debridement of chronic nonhealing left foot plantar surface wound with placement of myriad performed 10/17/2023 by Dr. Waddell who presents to clinic for postoperative follow-up.   He was last seen here in clinic on 04/04/2024.  At that time, he was a bit frustrated, understandably, given the chronicity of his condition.  He had recently gone to [facility] and their recommendations were similar to the ones provided by [facility] wound [facility] and he remains interested in surgical intervention.  He been referred to [facility], but evidently there were some insurance issues.  No considerable improvement of bilateral plantar foot wounds and, if anything, it has regressed.  Will continue to hope for a possible flap closure with [facility] or [facility].  Placed additional referral today.  Continue with Xeroform, Kerlix, and offloading per wound care center in interim.  Today, patient reports that he had not heard anything about the referrals.  He also does not want to go to wound care anymore due to co-pays.  He has been paying for all of his wound care supplies locally at [facility] supply store which he states is also expensive.  He denies any pain, but endorses neuropathy and diminished sensation.  He states that he understands that surgical intervention via flap is likely needed given that conservative treatments have failed.   No Known Allergies  Outpatient Encounter Medications as of 05/22/2024  Medication Sig Note   HYDROcodone -acetaminophen  (NORCO) 5-325 MG tablet Take 1 tablet by mouth 2 (two) times daily as needed for moderate pain (pain score 4-6). 11/03/2023: Didn't bring bottle, LD 3 weeks ago   albuterol  (VENTOLIN  HFA) 108 (90  Base) MCG/ACT inhaler TAKE 2 PUFFS BY MOUTH EVERY 4 HOURS AS NEEDED    amLODipine  (NORVASC ) 5 MG tablet Take 5 mg by mouth daily.    ascorbic acid  (VITAMIN C) 500 MG tablet Take 500 mg by mouth daily.    budesonide -glycopyrrolate-formoterol  (BREZTRI  AEROSPHERE) 160-9-4.8 MCG/ACT AERO inhaler Inhale 2 puffs into the lungs in the morning and at bedtime.    cetirizine  (ZYRTEC ) 10 MG tablet Take 1 tablet (10 mg total) by mouth daily as needed for allergies (Can use an ectra dose during flare ups.).    Cholecalciferol (VITAMIN D3) 50 MCG (2000 UT) capsule Take 4,000 Units by mouth daily.    DULoxetine  (CYMBALTA ) 60 MG capsule 1 capsule Orally Once a day for 90 (Patient taking differently: Take 60 mg by mouth daily.)    Fluocinolone  Acetonide Body 0.01 % OIL Apply 1 Application topically daily as needed. Apply to neck and body as needed    gentamicin  ointment (GARAMYCIN ) 0.1 % Apply 1 Application topically daily. 10/17/2023: Used on the right foot this morning not on the left   ipratropium (ATROVENT ) 0.06 % nasal spray Place 1 spray into both nostrils 3 (three) times daily.    metoprolol  tartrate (LOPRESSOR ) 25 MG tablet Take 25 mg by mouth 2 (two) times daily.    montelukast  (SINGULAIR ) 10 MG tablet TAKE 1 TABLET BY MOUTH EVERY DAY    ondansetron  (ZOFRAN -ODT) 4 MG disintegrating tablet Take 1 tablet (4 mg total) by mouth every 8 (eight) hours  as needed for nausea or vomiting.    OXYGEN  Inhale 2 L into the lungs at bedtime.    pantoprazole  (PROTONIX ) 40 MG tablet Take 40 mg by mouth daily.    sildenafil (VIAGRA) 50 MG tablet Take 50 mg by mouth as needed for erectile dysfunction. 10/17/2023: Hasn't started   Spacer/Aero-Holding Chambers DEVI 1 Device by Does not apply route as directed.    traZODone  (DESYREL ) 100 MG tablet TAKE 2 TABLETS BY MOUTH AT BEDTIME.    triamcinolone  (NASACORT ) 55 MCG/ACT AERO nasal inhaler Place 1 spray into the nose every morning.    zinc  gluconate 50 MG tablet Take 50 mg by  mouth daily.    No facility-administered encounter medications on file as of 05/22/2024.     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   Critical illness polyneuropathy 09/15/2020   Depression    Diffuse alveolar damage (HCC) 03/09/2020   Dyspnea    Encounter for attention to tracheostomy (HCC) 09/15/2020   Encounter for therapeutic drug monitoring 09/29/2020   Epigastric pain    Eschar of foot    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   Fever    GERD (gastroesophageal reflux disease)    Goals of care, counseling/discussion    Gram positive bacterial infection    Headache    History of COVID-19    History of kidney stones    LEFT URETERAL STONE   HTN (hypertension)    Hyperlipidemia 07/18/2023   Impaired sensation to light touch 04/15/2020   Insomnia due to other mental disorder 12/06/2021   Left foot drop 09/06/2021   Leukopenia    Long COVID 09/09/2020   Moderate persistent asthma 07/21/2015   Moderate protein-calorie malnutrition 09/15/2020   Mood disorder 09/07/2022   Morbid obesity (HCC) with starting BMI 46 09/15/2020   MSSA (methicillin susceptible Staphylococcus aureus) infection 08/31/2020   Need for emotional support    Nocturnal hypoxemia 09/07/2022   Obstructive sleep apnea 01/11/2022   nocturnal hypoxemia, uses 2L O2 at night   On home O2    2L at night   Other fatigue 09/07/2022   Palliative care by specialist    Personal  history of ECMO    PES PLANUS 10/08/2009   Qualifier: Diagnosis of   By: Cleatrice MD, Ludie      Replacing diagnoses that were inactivated after the 11/07/22 regulatory import     Pneumonia 07/2019   covid   Prediabetes    Preop cardiovascular exam    Pressure injury of skin 08/22/2019   Primary hypertension 04/17/2017   Pyogenic inflammation of bone (HCC) 09/09/2020   Reactive depression 07/01/2020   Septic arthritis of right acromioclavicular joint (HCC) 09/29/2017   SOBOE (shortness of breath on exertion)    Subcutaneous crepitus    TARSAL TUNNEL SYNDROME, LEFT 10/08/2009   Qualifier: Diagnosis of   By: Cleatrice MD, Ludie  Thrombocytopenia    Type 2 diabetes mellitus without complication, without long-term current use of insulin  (HCC) 10/18/2022   Ulcer of left foot (HCC) 09/07/2022   Vitamin D  deficiency 10/18/2022   Wheelchair dependence 06/04/2021    Past Surgical History:  Procedure Laterality Date   APPLICATION OF WOUND VAC Left 10/17/2023   Procedure: Left plantar surface wound preparation and myriad placement;  Surgeon: Waddell Leonce NOVAK, MD;  Location: [facility];  Service: Plastics;  Laterality: Left;   CANNULATION FOR ECMO (EXTRACORPOREAL MEMBRANE OXYGENATION) N/A 08/28/2019   Procedure: CANNULATION FOR VV ECMO (EXTRACORPOREAL MEMBRANE OXYGENATION);  Surgeon: Fleeta Ochoa, Maude, MD;  Location: [facility] OR;  Service: Open Heart Surgery;  Laterality: N/A;  CRESCENT CANNULA   CANNULATION FOR ECMO (EXTRACORPOREAL MEMBRANE OXYGENATION) N/A 09/10/2019   Procedure: CANNULATION FOR ECMO (EXTRACORPOREAL MEMBRANE OXYGENATION) PUTTING IN CRESCENT 32FR CANNULA  AND REMOVING GROING CANNULATION;  Surgeon: German Bartlett PEDLAR, MD;  Location: [facility] OR;  Service: Open Heart Surgery;  Laterality: N/A;  PUTTING IN CRESCENT/REMOVING GROIN CANNULATION   CYSTOSCOPY/URETEROSCOPY/HOLMIUM LASER/STENT PLACEMENT Left 04/18/2019   Procedure: LEFT URETEROSCOPY/HOLMIUM LASER/STENT PLACEMENT;  Surgeon: Cam Morene ORN, MD;  Location: [facility];  Service: Urology;  Laterality: Left;   CYSTOSCOPY/URETEROSCOPY/HOLMIUM LASER/STENT PLACEMENT Left 05/02/2019   Procedure: CYSTOSCOPY/URETEROSCOPY/HOLMIUM LASER/STENT EXCHANGE;  Surgeon: Cam Morene ORN, MD;  Location: [facility];  Service: Urology;  Laterality: Left;   ECMO CANNULATION N/A 08/03/2019   Procedure: ECMO CANNULATION;  Surgeon: German Bartlett PEDLAR, MD;  Location: [facility];  Service: Cardiovascular;  Laterality: N/A;   ESOPHAGOGASTRODUODENOSCOPY N/A 09/11/2019   Procedure: ESOPHAGOGASTRODUODENOSCOPY (EGD);  Surgeon: German Bartlett PEDLAR, MD;  Location: [facility] OR;  Service: Thoracic;  Laterality: N/A;   GRAFT APPLICATION Bilateral 03/27/2020   Procedure: APPLICATION OF SKIN GRAFT BILATERAL FEET;  Surgeon: Janit Thresa HERO, DPM;  Location: [facility];  Service: Podiatry;  Laterality: Bilateral;   IR REPLC GASTRO/COLONIC TUBE PERCUT W/FLUORO  10/14/2019   IRRIGATION AND DEBRIDEMENT SHOULDER Right 09/29/2017   Procedure: IRRIGATION AND DEBRIDEMENT SHOULDER;  Surgeon: Jerri Kay HERO, MD;  Location: [facility] OR;  Service: Orthopedics;  Laterality: Right;   LUMBAR DISC SURGERY  2002   NASAL ENDOSCOPY WITH EPISTAXIS CONTROL N/A 08/31/2019   Procedure: NASAL ENDOSCOPY WITH EPISTAXIS CONTROL WITH CAUTERIZATION;  Surgeon: Carlie Clark, MD;  Location: [facility] OR;  Service: ENT;  Laterality: N/A;   PORT-A-CATH REMOVAL     PORTACATH PLACEMENT N/A 09/11/2019   Procedure: PEG TUBE INSERTION - BEDSIDE;  Surgeon: German Bartlett PEDLAR, MD;  Location: [facility] OR;  Service: Thoracic;  Laterality: N/A;   TEE WITHOUT CARDIOVERSION N/A 08/28/2019   Procedure: TRANSESOPHAGEAL ECHOCARDIOGRAM (TEE);  Surgeon: Fleeta Ochoa, Maude, MD;  Location[facility];  Service: Open Heart Surgery;  Laterality: N/A;   TEE WITHOUT CARDIOVERSION N/A 09/10/2019   Procedure: TRANSESOPHAGEAL ECHOCARDIOGRAM (TEE);  Surgeon: German Bartlett PEDLAR, MD;  Location: [facility] OR;  Service: Open Heart Surgery;   Laterality: N/A;   WOUND DEBRIDEMENT Bilateral 03/27/2020   Procedure: EXCISIONAL DEBRIDEMENT OF ULCERS BILATERAL FEET;  Surgeon: Janit Thresa HERO, DPM;  Location: [facility];  Service: Podiatry;  Laterality: Bilateral;    Family History  Problem Relation Age of Onset   Migraines Mother    GER disease Mother    Heart disease Father    Hyperlipidemia Father    Diabetes Father    Hypertension Father    Sleep apnea Father    Obesity Father    Asthma Brother    Pancreatic cancer Maternal  Grandmother    Heart disease Maternal Grandfather    Diabetes Paternal Grandmother    Hypertension Paternal Grandmother     Social History   Social History Narrative   Not on file     Review of Systems General: Denies fevers or chills Skin: Drainage at time of dressing changes  Physical Exam    05/22/2024   10:21 AM 05/17/2024   11:12 AM 04/04/2024   11:31 AM  Vitals with BMI  Height 5' 8.5 5' 8.5   Weight 322 lbs 322 lbs 322 lbs 3 oz  BMI 48.24 48.24   Systolic 148 135 853  Diastolic 83 61 105  Pulse 75 67 82    General:  No acute distress, nontoxic appearing  Respiratory: No increased work of breathing Neuro: Alert and oriented Psychiatric: Normal mood and affect  Skin: Left foot wound measures 5 x 3 x 0.25 cm, good granular base.  Skin edges appear a bit macerated.  No erythema, induration, crepitus, or other findings otherwise concerning for infection.  Right foot wound measures 2.5 x 2 x 0.25 cm depth.  Again, smooth granular base and no evidence of infection.  These are chronic findings.  Assessment/Plan  Chronic plantar foot wounds:  No significant change since last encounter.  No evidence concerning for infection.  Emphasized the importance of following up with the referrals to [facility] and [facility].  Confirmed with my team and they are both authorized.  Evidently he was called and provided with the phone number to schedule an appointment, but patient does not recall.  He was provided  with this information here prior to departure.  Referrals are now open again with both [facility] and [facility].  He understands that he needs to see them for definitive intervention and management and that we are not able to provide much beyond dressing changes given that the myriad placement did not prove beneficial.  Will place order through [facility] supplies for calcium  alginate followed by 4 x 4 gauze and Kerlix wrap changed every other day.  There was a mild to moderate amount of drainage on his Adaptic dressing changes which is why we will transition him to the silver calcium  alginate.  He may return to office in 6 weeks if needed.   Honora Seip PA-C 05/22/2024, 11:14 AM

## 2024-05-23 DIAGNOSIS — Z1212 Encounter for screening for malignant neoplasm of rectum: Secondary | ICD-10-CM | POA: Diagnosis not present

## 2024-05-23 DIAGNOSIS — Z1211 Encounter for screening for malignant neoplasm of colon: Secondary | ICD-10-CM | POA: Diagnosis not present

## 2024-05-29 LAB — COLOGUARD: COLOGUARD: NEGATIVE

## 2024-06-16 NOTE — Progress Notes (Unsigned)
   [address]  [place] [address] Dept: [phone]  FOLLOW UP NOTE  Patient ID: [name] Shoulder, male    DOB: March 07, 1976  Age: 48 y.o. MRN: [id number] Date of Office Visit: 06/17/2024  Assessment  Chief Complaint: No chief complaint on file.  HPI [name] is a 48 year old male who presents to the clinic for follow-up visit.  He was last seen in this clinic on 12/21/2023 by Dr. Iva for evaluation of asthma, allergic rhinitis, and atopic dermatitis.  His last environmental allergy testing via lab on 10/18/2022 was positive to grass pollen, weed pollen, ragweed pollen, tree pollen, dust mite, cat, and dog.  Discussed the use of AI scribe software for clinical note transcription with the patient, who gave verbal consent to proceed.  History of Present Illness      Drug Allergies:  No Known Allergies  Physical Exam: There were no vitals taken for this visit.   Physical Exam  Diagnostics:    Assessment and Plan: No diagnosis found.  No orders of the defined types were placed in this encounter.   There are no Patient Instructions on file for this visit.  No follow-ups on file.    Thank you for the opportunity to care for this patient.  Please do not hesitate to contact me with questions.  Arlean Mutter, FNP [facility]

## 2024-06-16 NOTE — Patient Instructions (Signed)
 Asthma Continue montelukast  10 mg once a day to prevent cough or wheeze Increase Breztri  to 2 puffs twice a day with a spacer to prevent cough or wheeze Continue albuterol  2 puffs once every 4 hours if needed for cough or wheeze You may use albuterol  2 puffs 5-15 minutes before activity to decrease cough or wheeze   Allergic rhinitis Continue allergen avoidance measures directed toward grass pollen, weed pollen, ragweed pollen, tree pollen, dust mite, cat, and dog as listed below Continue Nasacort  2 sprays in each nostril once a day if needed for stuffy nose Continue Atrovent  2 sprays in each nostril up to 3 times a day if needed for runny nose Consider saline nasal rinses as needed for nasal symptoms. Use this before any medicated nasal sprays for best result Consider allergen immunotherapy if your symptoms are not well-controlled with the treatment plan as listed above  Allergic conjunctivitis Some over the counter eye drops include Pataday one drop in each eye once a day as needed for red, itchy eyes OR Zaditor  one drop in each eye twice a day as needed for red itchy eyes. Avoid eye drops that say red eye relief as they may contain medications that dry out your eyes.   Atopic dermatitis Continue a twice a day moisturizing routine  Call the clinic if this treatment plan is not working well for you.  Follow up in 6 months or sooner if needed.  Reducing Pollen Exposure The American Academy of Allergy, Asthma and Immunology suggests the following steps to reduce your exposure to pollen during allergy seasons. Do not hang sheets or clothing out to dry; pollen may collect on these items. Do not mow lawns or spend time around freshly cut grass; mowing stirs up pollen. Keep windows closed at night.  Keep car windows closed while driving. Minimize morning activities outdoors, a time when pollen counts are usually at their highest. Stay indoors as much as possible when pollen counts or  humidity is high and on windy days when pollen tends to remain in the air longer. Use air conditioning when possible.  Many air conditioners have filters that trap the pollen spores. Use a HEPA room air filter to remove pollen form the indoor air you breathe.  Control of Mold Allergen Mold and fungi can grow on a variety of surfaces provided certain temperature and moisture conditions exist.  Outdoor molds grow on plants, decaying vegetation and soil.  The major outdoor mold, Alternaria and Cladosporium, are found in very high numbers during hot and dry conditions.  Generally, a late Summer - Fall peak is seen for common outdoor fungal spores.  Rain will temporarily lower outdoor mold spore count, but counts rise rapidly when the rainy period ends.  The most important indoor molds are Aspergillus and Penicillium.  Dark, humid and poorly ventilated basements are ideal sites for mold growth.  The next most common sites of mold growth are the bathroom and the kitchen.  Outdoor [employer] Use air conditioning and keep windows closed Avoid exposure to decaying vegetation. Avoid leaf raking. Avoid grain handling. Consider wearing a face mask if working in moldy areas.  Indoor Mold Control Maintain humidity below 50%. Clean washable surfaces with 5% bleach solution. Remove sources e.g. Contaminated carpets.  Control of Dog or Cat Allergen Avoidance is the best way to manage a dog or cat allergy. If you have a dog or cat and are allergic to dog or cats, consider removing the dog or cat from the  home. If you have a dog or cat but don't want to find it a new home, or if your family wants a pet even though someone in the household is allergic, here are some strategies that may help keep symptoms at bay:  Keep the pet out of your bedroom and restrict it to only a few rooms. Be advised that keeping the dog or cat in only one room will not limit the allergens to that room. Don't pet, hug or kiss the dog  or cat; if you do, wash your hands with soap and water . High-efficiency particulate air (HEPA) cleaners run continuously in a bedroom or living room can reduce allergen levels over time. Regular use of a high-efficiency vacuum cleaner or a central vacuum can reduce allergen levels. Giving your dog or cat a bath at least once a week can reduce airborne allergen.   Control of Dust Mite Allergen Dust mites play a major role in allergic asthma and rhinitis. They occur in environments with high humidity wherever human skin is found. Dust mites absorb humidity from the atmosphere (ie, they do not drink) and feed on organic matter (including shed human and animal skin). Dust mites are a microscopic type of insect that you cannot see with the naked eye. High levels of dust mites have been detected from mattresses, pillows, carpets, upholstered furniture, bed covers, clothes, soft toys and any woven material. The principal allergen of the dust mite is found in its feces. A gram of dust may contain 1,000 mites and 250,000 fecal particles. Mite antigen is easily measured in the air during house cleaning activities. Dust mites do not bite and do not cause harm to humans, other than by triggering allergies/asthma.  Ways to decrease your exposure to dust mites in your home:  1. Encase mattresses, box springs and pillows with a mite-impermeable barrier or cover  2. Wash sheets, blankets and drapes weekly in hot water  (130 F) with detergent and dry them in a dryer on the hot setting.  3. Have the room cleaned frequently with a vacuum cleaner and a damp dust-mop. For carpeting or rugs, vacuuming with a vacuum cleaner equipped with a high-efficiency particulate air (HEPA) filter. The dust mite allergic individual should not be in a room which is being cleaned and should wait 1 hour after cleaning before going into the room.  4. Do not sleep on upholstered furniture (eg, couches).  5. If possible removing  carpeting, upholstered furniture and drapery from the home is ideal. Horizontal blinds should be eliminated in the rooms where the person spends the most time (bedroom, study, television room). Washable vinyl, roller-type shades are optimal.  6. Remove all non-washable stuffed toys from the bedroom. Wash stuffed toys weekly like sheets and blankets above.  7. Reduce indoor humidity to less than 50%. Inexpensive humidity monitors can be purchased at most hardware stores. Do not use a humidifier as can make the problem worse and are not recommended.

## 2024-06-17 ENCOUNTER — Other Ambulatory Visit: Payer: Self-pay

## 2024-06-17 ENCOUNTER — Encounter: Payer: Self-pay | Admitting: Family Medicine

## 2024-06-17 ENCOUNTER — Ambulatory Visit: Admitting: Family Medicine

## 2024-06-17 VITALS — BP 128/82 | HR 85 | Temp 98.1°F | Resp 14 | Ht 68.0 in | Wt 331.1 lb

## 2024-06-17 DIAGNOSIS — L2084 Intrinsic (allergic) eczema: Secondary | ICD-10-CM | POA: Insufficient documentation

## 2024-06-17 DIAGNOSIS — J302 Other seasonal allergic rhinitis: Secondary | ICD-10-CM | POA: Diagnosis not present

## 2024-06-17 DIAGNOSIS — J4540 Moderate persistent asthma, uncomplicated: Secondary | ICD-10-CM | POA: Diagnosis not present

## 2024-06-17 DIAGNOSIS — H1010 Acute atopic conjunctivitis, unspecified eye: Secondary | ICD-10-CM

## 2024-06-17 DIAGNOSIS — H1013 Acute atopic conjunctivitis, bilateral: Secondary | ICD-10-CM | POA: Diagnosis not present

## 2024-06-17 DIAGNOSIS — J3089 Other allergic rhinitis: Secondary | ICD-10-CM

## 2024-06-17 MED ORDER — MONTELUKAST SODIUM 10 MG PO TABS: 10.0000 mg | ORAL_TABLET | Freq: Every day | ORAL | 1 refills | Status: AC

## 2024-06-17 MED ORDER — BREZTRI AEROSPHERE 160-9-4.8 MCG/ACT IN AERO: 2.0000 | INHALATION_SPRAY | Freq: Two times a day (BID) | RESPIRATORY_TRACT | 1 refills | Status: AC

## 2024-06-17 MED ORDER — ALBUTEROL SULFATE HFA 108 (90 BASE) MCG/ACT IN AERS
INHALATION_SPRAY | RESPIRATORY_TRACT | 2 refills | Status: AC
Start: 2024-06-17 — End: ?

## 2024-06-17 MED ORDER — CETIRIZINE HCL 10 MG PO TABS: 10.0000 mg | ORAL_TABLET | Freq: Every day | ORAL | 1 refills | Status: AC | PRN

## 2024-06-18 ENCOUNTER — Ambulatory Visit: Admitting: Behavioral Health

## 2024-06-18 ENCOUNTER — Other Ambulatory Visit: Payer: Self-pay | Admitting: Behavioral Health

## 2024-06-18 ENCOUNTER — Encounter: Payer: Self-pay | Admitting: Behavioral Health

## 2024-06-18 DIAGNOSIS — F411 Generalized anxiety disorder: Secondary | ICD-10-CM

## 2024-06-18 DIAGNOSIS — F39 Unspecified mood [affective] disorder: Secondary | ICD-10-CM

## 2024-06-18 DIAGNOSIS — F331 Major depressive disorder, recurrent, moderate: Secondary | ICD-10-CM

## 2024-06-18 MED ORDER — DULOXETINE HCL 30 MG PO CPEP: 30.0000 mg | ORAL_CAPSULE | Freq: Every day | ORAL | 0 refills | Status: AC

## 2024-06-18 MED ORDER — LAMOTRIGINE 25 MG PO TABS: ORAL_TABLET | ORAL | 1 refills | Status: DC

## 2024-06-18 MED ORDER — DULOXETINE HCL 60 MG PO CPEP: ORAL_CAPSULE | ORAL | 1 refills | Status: DC

## 2024-06-18 NOTE — Progress Notes (Signed)
 [facility]  Patient ID: [name],  MRN: [id number]  PCP: Stephane Leita DEL, MD  Date of Evaluation: 06/18/2024 Time spent:40 minutes  Chief Complaint:  Chief Complaint   Anxiety; Depression; Follow-up; Medication Refill; Patient Education     HISTORY/CURRENT STATUS: HPI  48 year old male presents to this office  for follow up and medication management.  This is the first visit with patient and nearly 1.5 years.  He reports that he has been going through a divorce with his wife.  Says that he was not following up with his medications and he ran out of Cymbalta  approximately 3 weeks ago.  Says that he has experienced emotional lability and just not feeling well.  Says that he would like to start following up regularly and taking his medication as prescribed.  Patient states that he has noticed changes with his moods again experiencing highs and lows.  He also would like to consider restarting his Rx of Lamictal .  Patient continues seeing specialist for wound care on his foot.  Still ambulating using wheelchair.  He says his depression is 710 and anxiety is 7/10. He is sleeping 7-8 hours per night. Requesting no medication changes this visit. Denies mania, no psychosis, no current SI. No HI.    Prior psychiatric medication trials: Auvelity - dry mouth     Individual Medical History/ Review of Systems: Changes? :No   Allergies: Patient has no known allergies.  Current Medications:  Current Outpatient Medications:    DULoxetine  (CYMBALTA ) 30 MG capsule, Take 1 capsule (30 mg total) by mouth daily., Disp: 7 capsule, Rfl: 0   lamoTRIgine  (LAMICTAL ) 25 MG tablet, Take two tablets by mouth at bedtime daily, Disp: 60 tablet, Rfl: 1   albuterol  (VENTOLIN  HFA) 108 (90 Base) MCG/ACT inhaler, TAKE 2 PUFFS BY MOUTH EVERY 4 HOURS AS NEEDED, Disp: 18 each, Rfl: 2   amLODipine  (NORVASC ) 5 MG tablet, Take 5 mg by mouth daily., Disp: , Rfl:    ascorbic acid  (VITAMIN C) 500 MG tablet,  Take 500 mg by mouth daily., Disp: , Rfl:    budesonide -glycopyrrolate-formoterol  (BREZTRI  AEROSPHERE) 160-9-4.8 MCG/ACT AERO inhaler, Inhale 2 puffs into the lungs in the morning and at bedtime., Disp: 3 each, Rfl: 1   cetirizine  (ZYRTEC ) 10 MG tablet, Take 1 tablet (10 mg total) by mouth daily as needed for allergies (Can use an ectra dose during flare ups.)., Disp: 180 tablet, Rfl: 1   Cholecalciferol (VITAMIN D3) 50 MCG (2000 UT) capsule, Take 4,000 Units by mouth daily., Disp: , Rfl:    cyanocobalamin  (VITAMIN B12) 1000 MCG tablet, Take 1,000 mcg by mouth daily., Disp: , Rfl:    DULoxetine  (CYMBALTA ) 60 MG capsule, Take one capsule by mouth  once daily in the am after breakfast, Disp: 30 capsule, Rfl: 1   Fluocinolone  Acetonide Body 0.01 % OIL, Apply 1 Application topically daily as needed. Apply to neck and body as needed (Patient not taking: Reported on 06/17/2024), Disp: 355 mL, Rfl: 1   gentamicin  ointment (GARAMYCIN ) 0.1 %, Apply 1 Application topically daily. (Patient not taking: Reported on 06/17/2024), Disp: , Rfl:    HYDROcodone -acetaminophen  (NORCO) 5-325 MG tablet, Take 1 tablet by mouth 2 (two) times daily as needed for moderate pain (pain score 4-6)., Disp: 60 tablet, Rfl: 0   ipratropium (ATROVENT ) 0.06 % nasal spray, Place 1 spray into both nostrils 3 (three) times daily. (Patient taking differently: Place 1 spray into both nostrils as needed.), Disp: 45 mL, Rfl: 1  metoprolol  tartrate (LOPRESSOR ) 25 MG tablet, Take 25 mg by mouth 2 (two) times daily., Disp: , Rfl:    montelukast  (SINGULAIR ) 10 MG tablet, Take 1 tablet (10 mg total) by mouth daily., Disp: 90 tablet, Rfl: 1   ondansetron  (ZOFRAN -ODT) 4 MG disintegrating tablet, Take 1 tablet (4 mg total) by mouth every 8 (eight) hours as needed for nausea or vomiting., Disp: 20 tablet, Rfl: 0   OXYGEN , Inhale 2 L into the lungs at bedtime., Disp: , Rfl:    pantoprazole  (PROTONIX ) 40 MG tablet, Take 40 mg by mouth daily., Disp: ,  Rfl:    sildenafil (VIAGRA) 50 MG tablet, Take 50 mg by mouth as needed for erectile dysfunction., Disp: , Rfl:    Spacer/Aero-Holding Chambers DEVI, 1 Device by Does not apply route as directed., Disp: 1 each, Rfl: 1   traZODone  (DESYREL ) 100 MG tablet, TAKE 2 TABLETS BY MOUTH AT BEDTIME., Disp: 180 tablet, Rfl: 3   triamcinolone  (NASACORT ) 55 MCG/ACT AERO nasal inhaler, Place 1 spray into the nose every morning. (Patient taking differently: Place 1 spray into the nose as needed.), Disp: 50.7 mL, Rfl: 1   zinc  gluconate 50 MG tablet, Take 50 mg by mouth daily., Disp: , Rfl:  Medication Side Effects: none  Family Medical/ Social History: Changes? No  MENTAL HEALTH EXAM:  Height 5' 8.5 (1.74 m), weight (!) 331 lb (150.1 kg).Body mass index is 49.6 kg/m.  General Appearance: Casual  Eye Contact:  Good  Speech:  Clear and Coherent  Volume:  Normal  Mood:  Anxious, Depressed, and Dysphoric  Affect:  Congruent, Depressed, and Anxious  Thought Process:  Coherent  Orientation:  Full (Time, Place, and Person)  Thought Content: Logical   Suicidal Thoughts:  No  Homicidal Thoughts:  No  Memory:  WNL  Judgement:  Good  Insight:  Good  Psychomotor Activity:  Normal  Concentration:  Concentration: Good  Recall:  Good  Fund of Knowledge: Good  Language: Good  Assets:  Desire for Improvement  ADL's:  Intact  Cognition: WNL  Prognosis:  Good    DIAGNOSES:    ICD-10-CM   1. Major depressive disorder, recurrent episode, moderate (HCC)  F33.1 DULoxetine  (CYMBALTA ) 60 MG capsule    DULoxetine  (CYMBALTA ) 30 MG capsule    2. Generalized anxiety disorder  F41.1 DULoxetine  (CYMBALTA ) 60 MG capsule    DULoxetine  (CYMBALTA ) 30 MG capsule    3. Unspecified mood (affective) disorder  F39 DULoxetine  (CYMBALTA ) 60 MG capsule    DULoxetine  (CYMBALTA ) 30 MG capsule    lamoTRIgine  (LAMICTAL ) 25 MG tablet      Receiving Psychotherapy: No    RECOMMENDATIONS:   Greater than 50% of 40  min face  to face with patient was spent on counseling and coordination of care.  We discussed his complex psychosocial changes with pending divorce from wife.  Reports being under a lot of stress recently.  Acknowledges not following up as he should for psychiatric care.  He went cold turkey after running out of his Cymbalta  approximately 3 weeks ago and not feeling well.  Educated patient on importance of taking medications as prescribed and notifying this office with changes.  We also discussed his continued problems with irritability and mood fluctuations.  Had discussion with patient about GLP-1 medications and recommended he follow-up with PCP.  Patient is currently 331 lbs which is placing him at risk for other significant health issues and concerns.  We agreed to: Continue  Cymbalta  to 60 mg  daily.  Will start with 30 mg daily for 1 week before resuming the 60 mg dose. Patient will restart Lamictal  25 mg for 7 days then 50 mg daily. Continue Trazodone  200 mg at bedtime Will report any side effects or worsening symptoms To follow up in 6 weeks  to reassess Provided emergency contact information Reviewed PDMP      Redell DELENA Pizza, NP

## 2024-06-19 ENCOUNTER — Other Ambulatory Visit: Payer: Self-pay | Admitting: Behavioral Health

## 2024-06-19 DIAGNOSIS — S91302A Unspecified open wound, left foot, initial encounter: Secondary | ICD-10-CM | POA: Diagnosis not present

## 2024-06-19 DIAGNOSIS — Z131 Encounter for screening for diabetes mellitus: Secondary | ICD-10-CM | POA: Diagnosis not present

## 2024-06-19 DIAGNOSIS — M7752 Other enthesopathy of left foot: Secondary | ICD-10-CM | POA: Diagnosis not present

## 2024-06-19 DIAGNOSIS — M19072 Primary osteoarthritis, left ankle and foot: Secondary | ICD-10-CM | POA: Diagnosis not present

## 2024-06-19 DIAGNOSIS — M7989 Other specified soft tissue disorders: Secondary | ICD-10-CM | POA: Diagnosis not present

## 2024-06-19 DIAGNOSIS — F39 Unspecified mood [affective] disorder: Secondary | ICD-10-CM

## 2024-06-19 MED ORDER — LAMOTRIGINE 25 MG PO TABS: ORAL_TABLET | ORAL | 1 refills | Status: AC

## 2024-06-19 NOTE — Telephone Encounter (Signed)
 Rx was not sent correctly. Please review

## 2024-06-19 NOTE — Telephone Encounter (Signed)
 Pt has been on medication previously with no side effects. I explained to him to take 25 mg for one week to ramp back up. It does not reflect on bottle. Pt expressed understanding on how to restart.

## 2024-07-02 ENCOUNTER — Encounter (HOSPITAL_BASED_OUTPATIENT_CLINIC_OR_DEPARTMENT_OTHER): Payer: Self-pay | Admitting: Pulmonary Disease

## 2024-07-02 ENCOUNTER — Ambulatory Visit (HOSPITAL_BASED_OUTPATIENT_CLINIC_OR_DEPARTMENT_OTHER): Admitting: Pulmonary Disease

## 2024-07-02 VITALS — BP 121/68 | HR 73 | Temp 98.9°F | Ht 68.0 in

## 2024-07-02 DIAGNOSIS — Z8616 Personal history of COVID-19: Secondary | ICD-10-CM

## 2024-07-02 DIAGNOSIS — J4530 Mild persistent asthma, uncomplicated: Secondary | ICD-10-CM

## 2024-07-02 DIAGNOSIS — Z8709 Personal history of other diseases of the respiratory system: Secondary | ICD-10-CM | POA: Diagnosis not present

## 2024-07-02 DIAGNOSIS — G4733 Obstructive sleep apnea (adult) (pediatric): Secondary | ICD-10-CM

## 2024-07-02 NOTE — Progress Notes (Unsigned)
 Patient ID: [name] Shoulder, male    DOB: 06/10/1976, 48 y.o.   MRN: [id number]  Chief Complaint  Patient presents with   Sarcoidosis   Asthma    Referring provider: Stephane Leita DEL, MD  HPI:  [name] is a 47 y.o. man with history of asthma, hx prolonged hospitalization for COVID19 ARDS requiring ECMO with trach s/p decannulation, critical illness myopathy, chronic bilateral foot wounds, severe OSA on CPAP who presents for follow-up.  07/05/23 Patient was last seen in 2022 with Dr. Annella. He reports he is not consistent with his Breztri  and is followed by Dr. Iva with [facility]. He has some cough and shortness of breath off the inhaler but improved whenever he is compliant. Previously on Trelegy and did well with this but insurance did not cover. Dr. Leonce Birmingham with [facility] to perform a left plantar surface wound prep and myriad placement on patient. Length of surgery 1 hour 15 min under general anesthesia.   10/02/23 Since our last visit he is using Breztri  daily and albuterol  two puffs nightly. Short of breath with activity including with upper body movements for 5 minutes. Planning for March 11 for left foot for plantar fascitis surgery.  07/02/24 Since our last visit he was started on CPAP for severe sleep apnea. Compliant with Breztri . Using albuterol  nightly. Has had recent stressors (divorce) have affected his sleep. Since using his CPAP and inhalers he has more energy, improved concentration and improved shortness of breath. Rarely uses rescue inhaler.  PMH: asthma, obesity Surgical History: ECMO cannulation, shoulder debridement 2019 Family History: asthma in brother, DM and HTN in father Social History: Lives in [place] with wife, son, worked in naval architect prior to [employer], never smoker   Public House Manager / Pulmonary Flowsheets:   ACT:  Asthma Control Test ACT Total Score  01/26/2023  1:00 PM 14  12/15/2020  9:00 AM 17   10/28/2020  3:36 PM 17  No Known Allergies   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   Critical illness polyneuropathy 09/15/2020   Depression    Diffuse alveolar damage (HCC) 03/09/2020   Dyspnea    Encounter for attention to tracheostomy (HCC) 09/15/2020   Encounter for therapeutic drug monitoring 09/29/2020   Epigastric pain    Eschar of foot    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   Fever    GERD (gastroesophageal reflux disease)    Goals of care, counseling/discussion    Gram positive bacterial infection    Headache    History of COVID-19    History of kidney stones    LEFT URETERAL STONE   HTN (hypertension)    Hyperlipidemia 07/18/2023   Impaired sensation to light touch 04/15/2020   Insomnia due to other mental disorder 12/06/2021   Intrinsic atopic dermatitis 06/17/2024   Left foot drop 09/06/2021   Leukopenia    Long COVID 09/09/2020  Moderate persistent asthma 07/21/2015   Moderate protein-calorie malnutrition 09/15/2020   Mood disorder 09/07/2022   Morbid obesity (HCC) with starting BMI 46 09/15/2020   MSSA (methicillin susceptible Staphylococcus aureus) infection 08/31/2020   Need for emotional support    Nocturnal hypoxemia 09/07/2022   Obstructive sleep apnea 01/11/2022   nocturnal hypoxemia, uses 2L O2 at night   On home O2    2L at night   Other fatigue 09/07/2022   Palliative care by specialist    Personal  history of ECMO    PES PLANUS 10/08/2009   Qualifier: Diagnosis of   By: Cleatrice MD, Ludie      Replacing diagnoses that were inactivated after the 11/07/22 regulatory import     Pneumonia 07/2019   covid   Prediabetes    Preop cardiovascular exam    Pressure injury of skin 08/22/2019   Primary hypertension 04/17/2017   Pyogenic inflammation of bone (HCC) 09/09/2020   Reactive depression 07/01/2020   Septic arthritis of right acromioclavicular joint (HCC) 09/29/2017   SOBOE (shortness of breath on exertion)    Subcutaneous crepitus    TARSAL TUNNEL SYNDROME, LEFT 10/08/2009   Qualifier: Diagnosis of   By: Cleatrice MD, Shane         Thrombocytopenia    Type 2 diabetes mellitus without complication, without long-term current use of insulin  (HCC) 10/18/2022   Ulcer of left foot (HCC) 09/07/2022   Vitamin D  deficiency 10/18/2022   Wheelchair dependence 06/04/2021     Outpatient Encounter Medications as of 07/02/2024  Medication Sig   albuterol  (VENTOLIN  HFA) 108 (90 Base) MCG/ACT inhaler TAKE 2 PUFFS BY MOUTH EVERY 4 HOURS AS NEEDED   amLODipine  (NORVASC ) 5 MG tablet Take 5 mg by mouth daily.   ascorbic acid  (VITAMIN C) 500 MG tablet Take 500 mg by mouth daily.   budesonide -glycopyrrolate-formoterol  (BREZTRI  AEROSPHERE) 160-9-4.8 MCG/ACT AERO inhaler Inhale 2 puffs into the lungs in the morning and at bedtime.   cetirizine  (ZYRTEC ) 10 MG tablet Take 1 tablet (10 mg total) by mouth daily as needed for allergies (Can use an ectra dose during flare ups.).   Cholecalciferol (VITAMIN D3) 50 MCG (2000 UT) capsule Take 4,000 Units by mouth daily.   cyanocobalamin  (VITAMIN B12) 1000 MCG tablet Take 1,000 mcg by mouth daily.   DULoxetine  (CYMBALTA ) 60 MG capsule Take one capsule by mouth  once daily in the am after breakfast   HYDROcodone -acetaminophen  (NORCO) 5-325 MG tablet Take 1 tablet by mouth 2 (two) times daily as needed for moderate pain (pain score 4-6).   ipratropium (ATROVENT ) 0.06 %  nasal spray Place 1 spray into both nostrils 3 (three) times daily.   metoprolol  tartrate (LOPRESSOR ) 25 MG tablet Take 25 mg by mouth 2 (two) times daily.   montelukast  (SINGULAIR ) 10 MG tablet Take 1 tablet (10 mg total) by mouth daily.   ondansetron  (ZOFRAN -ODT) 4 MG disintegrating tablet Take 1 tablet (4 mg total) by mouth every 8 (eight) hours as needed for nausea or vomiting.   OXYGEN  Inhale 2 L into the lungs at bedtime. (Patient taking differently: Inhale 3 L into the lungs at bedtime.)   sildenafil (VIAGRA) 50 MG tablet Take 50 mg by mouth as needed for erectile dysfunction.   Spacer/Aero-Holding Chambers DEVI 1 Device by Does not apply route as directed.   traZODone  (DESYREL ) 100 MG tablet TAKE 2 TABLETS BY MOUTH AT BEDTIME.   zinc  gluconate 50 MG tablet Take 50 mg by mouth daily.   DULoxetine  (CYMBALTA )  30 MG capsule Take 1 capsule (30 mg total) by mouth daily. (Patient not taking: Reported on 07/02/2024)   Fluocinolone  Acetonide Body 0.01 % OIL Apply 1 Application topically daily as needed. Apply to neck and body as needed (Patient not taking: Reported on 07/02/2024)   gentamicin  ointment (GARAMYCIN ) 0.1 % Apply 1 Application topically daily. (Patient not taking: Reported on 07/02/2024)   lamoTRIgine  (LAMICTAL ) 25 MG tablet Take one tablet by mouth at bedtime for one week, then two tablets daily at bedtime (50 mg total). (Patient not taking: Reported on 07/02/2024)   pantoprazole  (PROTONIX ) 40 MG tablet Take 40 mg by mouth daily.   triamcinolone  (NASACORT ) 55 MCG/ACT AERO nasal inhaler Place 1 spray into the nose every morning. (Patient not taking: Reported on 07/02/2024)   No facility-administered encounter medications on file as of 07/02/2024.     Review of Systems  Review of Systems  Constitutional:  Negative for chills, diaphoresis and fever.  HENT:  Negative for congestion.   Respiratory:  Positive for shortness of breath. Negative for cough and wheezing.   Cardiovascular:   Negative for chest pain, palpitations and leg swelling.    Physical Exam  BP 121/68   Pulse 73   Temp 98.9 F (37.2 C)   Ht 5' 8 (1.727 m)   SpO2 92%   BMI 50.33 kg/m   Physical Exam: General: Well-appearing, no acute distress HENT: [place], AT Eyes: EOMI, no scleral icterus Respiratory: Clear to auscultation bilaterally.  No crackles, wheezing or rales Cardiovascular: RRR, -M/R/G, no JVD Extremities:-Edema,-tenderness Neuro: AAO x4, CNII-XII grossly intact Psych: Normal mood, normal affect  Imaging: CXR 10/27/2019: Low lung volumes, bilateral diffuse infiltrates most dense in center of chest CT Chest 06/12/21: Resolved airspace disease. Scarring with bronchiectasis and architectural distortion bilaterally  PFT: 10/02/23 FVC 2.93 (60%) FEV1 2.45 (65%) Ratio 84  TLC 88% DLCO 98% Interpretation: Reduced FVC and FEV1. No obstructive or restrictive defect  Sleep study:  02/21/24 AHI 66.6h. Nadir SpO2 61% Mean 87.9%  CPAP Compliance 06/01/24-06/30/24 Usage day 30/30 days (100%) >4 hours 30 days (100%) Avg hours 8 hours 3 min Auto CPAP 5-20 cm H20 AHI 3.8   Assessment & Plan:   48 year old male with asthma, hx ARDS secondary to COVID requiring VV ECMO s/p tracheostomy s/p decannulation who presents for follow-up. Improved dyspnea on CPAP and bronchodilators.   Mild persistent asthma - improved symptoms Post-ARDS 2/2 covid --CONTINUE Breztri  as prescribed. TWO puffs in the morning and evening --CONTINUE Albuterol  TWO puffs AS NEEDED for shortness of breath or wheezing  Severe OSA Suspected pulmonary hypertension likely related to OSA Patient uses NIV for more than four hours nightly for at least 70% of nights during the last three months of usage. The patient has been using and benefiting from PAP use and will continue to benefit from therapy.  --Counseled on sleep hygiene --Counseled on weight loss/maintenance of healthy weight --Counseled NOT to drive if/when  sleepy --Advised patient to wear CPAP for at least 4 hours each night for greater than 70% of the time to avoid the machine being repossessed by insurance. --Continue autoPAP 5-20 cm H20 --Discussed cleaning CPAP supplies. Handout given  Immunization History  Administered Date(s) Administered   Influenza, Quadrivalent, Recombinant, Inj, Pf 05/13/2020   PFIZER(Purple Top)SARS-COV-2 Vaccination 02/18/2020, 03/10/2020   No orders of the defined types were placed in this encounter.  No orders of the defined types were placed in this encounter.  Return in about 6 months (around 12/30/2024).  I have spent a total time of 30-minutes on the day of the appointment including chart review, data review, collecting history, coordinating care and discussing medical diagnosis and plan with the patient/family. Past medical history, allergies, medications were reviewed. Pertinent imaging, labs and tests included in this note have been reviewed and interpreted independently by me.  Avaley Coop Slater Staff, MD 07/02/2024

## 2024-07-02 NOTE — Patient Instructions (Signed)
 CPAP CLEANING INSTRUCTIONS Along with proper CPAP cleaning it is recommended that you replace your mask, tubing and filters once very 3 months and more frequently if you are sick.  DAILY CLEANING Do NOT use moisturizing soaps, bleach, scented oils, chlorine, or alcohol -based solutions to clean your supplies. These solutions may cause irritation to your skin and lungs and may reduce the life of your products. Dawn [employer] or [employer] works best for daily cleaning.  **If you've been sick, it's smart to wash your mask, tubing, humidifier and filter daily until your cold, flu or virus symptoms are gone. That can help reduce the amount of time you spend under the weather.  Before using your mask -wash your face daily with soap and water  to remove excess facial oils.  Wipe down your mask (including areas that come in contact with your skin) using a damp towel with soap and warm water . This will remove any oils, dead skin cells, and sweat on the mask that can affect the quality of the seal. Gently rinse with a clean towel and let the mask air-dry out of direct sunlight.  You can also use unscented baby wipes or pre-moistened towels designed specifically for cleaning CPAP masks, which are available on-line. DO NOT USE CLOROX OR DISINFECTING WIPES.  If your unit has a humidifier, empty any leftover water  instead of letting in sit in the unit all day. Refill the humidifier with clean, distilled water  right before bedtime for optimal use  WEEKLY (OR MORE FREQUENT) CLEANING Your mask and tubing need a full bath at least once a week to keep it free of dust, bacteria, and germs. (During COVID-19 or any other flu/virus we recommend more frequent cleaning)  Clean the CPAP tubing, nasal mask, and headgear in a bathroom sink filled with warm water  and a few drops of ammonia-free, mild dish detergent. Avoid using stronger cleaning products, as they may damage the mask or leave harmful residue.   Swirl all  parts around for about five minutes, rinse well and let air dry during the day. Hang the tubing over the shower rod, on a towel rack or in the laundry room to ensure all the water  drips out.  The mask and headgear can be air-dried on a towel or hung on a hook or hanger.  You should also wipe down your CPAP machine with a damp cloth. Ensure the unit is unplugged. The towel shouldn't be too damp or wet, as water  could get into the machine.  Clean the filter by removing it and rinsing it in warm tap water . Run it under the water  and squeeze to make sure there is no dust. Then blot down the filter with a towel.  DO NOT wash your machine's white filter, if one is present--those are disposable and you should replace white filter every two weeks. If you are recovering from being sick, we recommend changing the filter sooner.  If your CPAP has a humidifier, that also needs to be cleaned weekly. Empty any remaining water  and then wash the water  chamber in the sink with warm soapy water . Rinse well and drain out as much of the water  as possible. Let the chamber air-dry before placing it back into the CPAP unit.  Every other week you should disinfect the humidifier. Do that by soaking it in a solution of one-part vinegar to five parts water  for 30 minutes, thoroughly rinsing and then placing in your dishwasher's top rack for washing. And keep it clean by using  only distilled water  to prevent mineral deposits that can build up and cause damage to your machine.  IMPORTANT TIPS Make caring for your CPAP equipment part of your morning routine. Keep machine and accessories out of direct sunlight to avoid damaging them. Never use bleach to clean accessories. Place machine on a level surface and away from curtains that may interfere with the air intake.

## 2024-07-10 ENCOUNTER — Other Ambulatory Visit: Payer: Self-pay | Admitting: Behavioral Health

## 2024-07-10 DIAGNOSIS — F331 Major depressive disorder, recurrent, moderate: Secondary | ICD-10-CM

## 2024-07-10 DIAGNOSIS — F411 Generalized anxiety disorder: Secondary | ICD-10-CM

## 2024-07-10 DIAGNOSIS — F39 Unspecified mood [affective] disorder: Secondary | ICD-10-CM

## 2024-07-14 ENCOUNTER — Other Ambulatory Visit (HOSPITAL_BASED_OUTPATIENT_CLINIC_OR_DEPARTMENT_OTHER): Payer: Self-pay | Admitting: Plastic and Reconstructive Surgery

## 2024-07-14 DIAGNOSIS — S91302A Unspecified open wound, left foot, initial encounter: Secondary | ICD-10-CM

## 2024-07-15 ENCOUNTER — Ambulatory Visit (HOSPITAL_COMMUNITY): Admission: RE | Admit: 2024-07-15 | Discharge: 2024-07-15 | Attending: Surgery | Admitting: Surgery

## 2024-07-15 DIAGNOSIS — S91302A Unspecified open wound, left foot, initial encounter: Secondary | ICD-10-CM

## 2024-07-15 LAB — VAS US ABI WITH/WO TBI
Left ABI: 1.32
Right ABI: 1.29

## 2024-08-05 ENCOUNTER — Ambulatory Visit: Admitting: Behavioral Health

## 2024-08-15 ENCOUNTER — Encounter: Admitting: Vascular Surgery

## 2024-08-15 ENCOUNTER — Ambulatory Visit (HOSPITAL_COMMUNITY)

## 2024-08-18 ENCOUNTER — Other Ambulatory Visit: Payer: Self-pay | Admitting: Behavioral Health

## 2024-08-18 DIAGNOSIS — F411 Generalized anxiety disorder: Secondary | ICD-10-CM

## 2024-08-18 DIAGNOSIS — F39 Unspecified mood [affective] disorder: Secondary | ICD-10-CM

## 2024-08-18 DIAGNOSIS — F331 Major depressive disorder, recurrent, moderate: Secondary | ICD-10-CM

## 2024-08-28 ENCOUNTER — Ambulatory Visit: Admitting: Behavioral Health

## 2024-08-28 ENCOUNTER — Encounter: Payer: Self-pay | Admitting: Behavioral Health

## 2024-08-28 DIAGNOSIS — F99 Mental disorder, not otherwise specified: Secondary | ICD-10-CM

## 2024-08-28 DIAGNOSIS — F5105 Insomnia due to other mental disorder: Secondary | ICD-10-CM

## 2024-08-28 DIAGNOSIS — F331 Major depressive disorder, recurrent, moderate: Secondary | ICD-10-CM

## 2024-08-28 DIAGNOSIS — F39 Unspecified mood [affective] disorder: Secondary | ICD-10-CM | POA: Diagnosis not present

## 2024-08-28 DIAGNOSIS — F411 Generalized anxiety disorder: Secondary | ICD-10-CM

## 2024-08-28 MED ORDER — TRAZODONE HCL 100 MG PO TABS: 150.0000 mg | ORAL_TABLET | Freq: Every day | ORAL | 3 refills | Status: AC

## 2024-08-28 MED ORDER — DULOXETINE HCL 60 MG PO CPEP: ORAL_CAPSULE | ORAL | 3 refills | Status: AC

## 2024-08-28 NOTE — Progress Notes (Signed)
 "     [facility]  Patient ID: [name],  MRN: [id number]  PCP: Stephane Leita DEL, MD  Date of Evaluation: 08/28/2024 Time spent:30 minutes  Chief Complaint:   HISTORY/CURRENT STATUS: HPI 50 year old male presents to this office  for follow up and medication management.  Patient states that he is feeling much better since the reinitiation of his medication.  Does not want to make any further changes today and will continue with current medication regimen.  Patient continues seeing specialist for wound care on his foot.  Still ambulating using wheelchair.  He says his depression is 3/10 and anxiety is 4/10. He is sleeping 7-8 hours per night. Requesting no medication changes this visit. Denies mania, no psychosis, no current SI. No HI.    Prior psychiatric medication trials: Auvelity - dry mouth        Individual Medical History/ Review of Systems: Changes? :No   Allergies: Patient has no known allergies.  Current Medications: Current Medications[1] Medication Side Effects: none  Family Medical/ Social History: Changes? No  MENTAL HEALTH EXAM:  There were no vitals taken for this visit.There is no height or weight on file to calculate BMI.  General Appearance: Casual, Neat, and Well Groomed  Eye Contact:  Good  Speech:  Clear and Coherent  Volume:  Normal  Mood:  Anxious and Depressed  Affect:  Depressed and Anxious  Thought Process:  Coherent  Orientation:  Full (Time, Place, and Person)  Thought Content: Logical   Suicidal Thoughts:  No  Homicidal Thoughts:  No  Memory:  WNL  Judgement:  Good  Insight:  Good  Psychomotor Activity:  Normal  Concentration:  Concentration: Good  Recall:  Good  Fund of Knowledge: Good  Language: Good  Assets:  Desire for Improvement  ADL's:  Intact  Cognition: WNL  Prognosis:  Good    DIAGNOSES:    ICD-10-CM   1. Insomnia due to other mental disorder  F51.05    F99     2. Major depressive disorder, recurrent  episode, moderate (HCC)  F33.1     3. Generalized anxiety disorder  F41.1     4. Unspecified mood (affective) disorder  F39       Receiving Psychotherapy: No    RECOMMENDATIONS:  Greater than 50% of 30  min face to face with patient was spent on counseling and coordination of care.  After going cold turkey on his medication he has been doing very well since he restarted Lamictal  and Cymbalta .  Would like to continue with this regimen for a little longer before considering adjustment.  He remains on GLP-1 medication.   We agreed to: Continue  Cymbalta  to 60 mg daily.  Continue Lamictal   50 mg daily. Continue Trazodone  200 mg at bedtime Will report any side effects or worsening symptoms To follow up in 12 weeks  to reassess Provided emergency contact information Reviewed PDMP    Redell DELENA Pizza, NP     [1]  Current Outpatient Medications:    albuterol  (VENTOLIN  HFA) 108 (90 Base) MCG/ACT inhaler, TAKE 2 PUFFS BY MOUTH EVERY 4 HOURS AS NEEDED, Disp: 18 each, Rfl: 2   amLODipine  (NORVASC ) 5 MG tablet, Take 5 mg by mouth daily., Disp: , Rfl:    ascorbic acid  (VITAMIN C) 500 MG tablet, Take 500 mg by mouth daily., Disp: , Rfl:    budesonide -glycopyrrolate-formoterol  (BREZTRI  AEROSPHERE) 160-9-4.8 MCG/ACT AERO inhaler, Inhale 2 puffs into the lungs in the morning and  at bedtime., Disp: 3 each, Rfl: 1   cetirizine  (ZYRTEC ) 10 MG tablet, Take 1 tablet (10 mg total) by mouth daily as needed for allergies (Can use an ectra dose during flare ups.)., Disp: 180 tablet, Rfl: 1   Cholecalciferol (VITAMIN D3) 50 MCG (2000 UT) capsule, Take 4,000 Units by mouth daily., Disp: , Rfl:    cyanocobalamin  (VITAMIN B12) 1000 MCG tablet, Take 1,000 mcg by mouth daily., Disp: , Rfl:    DULoxetine  (CYMBALTA ) 30 MG capsule, Take 1 capsule (30 mg total) by mouth daily. (Patient not taking: Reported on 07/02/2024), Disp: 7 capsule, Rfl: 0   DULoxetine  (CYMBALTA ) 60 MG capsule, TAKE ONE CAPSULE BY MOUTH ONCE DAILY  IN THE AM AFTER BREAKFAST, Disp: 30 capsule, Rfl: 1   Fluocinolone  Acetonide Body 0.01 % OIL, Apply 1 Application topically daily as needed. Apply to neck and body as needed (Patient not taking: Reported on 07/02/2024), Disp: 355 mL, Rfl: 1   gentamicin  ointment (GARAMYCIN ) 0.1 %, Apply 1 Application topically daily. (Patient not taking: Reported on 07/02/2024), Disp: , Rfl:    HYDROcodone -acetaminophen  (NORCO) 5-325 MG tablet, Take 1 tablet by mouth 2 (two) times daily as needed for moderate pain (pain score 4-6)., Disp: 60 tablet, Rfl: 0   ipratropium (ATROVENT ) 0.06 % nasal spray, Place 1 spray into both nostrils 3 (three) times daily., Disp: 45 mL, Rfl: 1   lamoTRIgine  (LAMICTAL ) 25 MG tablet, Take one tablet by mouth at bedtime for one week, then two tablets daily at bedtime (50 mg total). (Patient not taking: Reported on 07/02/2024), Disp: 60 tablet, Rfl: 1   metoprolol  tartrate (LOPRESSOR ) 25 MG tablet, Take 25 mg by mouth 2 (two) times daily., Disp: , Rfl:    montelukast  (SINGULAIR ) 10 MG tablet, Take 1 tablet (10 mg total) by mouth daily., Disp: 90 tablet, Rfl: 1   ondansetron  (ZOFRAN -ODT) 4 MG disintegrating tablet, Take 1 tablet (4 mg total) by mouth every 8 (eight) hours as needed for nausea or vomiting., Disp: 20 tablet, Rfl: 0   OXYGEN , Inhale 2 L into the lungs at bedtime. (Patient taking differently: Inhale 3 L into the lungs at bedtime.), Disp: , Rfl:    pantoprazole  (PROTONIX ) 40 MG tablet, Take 40 mg by mouth daily., Disp: , Rfl:    sildenafil (VIAGRA) 50 MG tablet, Take 50 mg by mouth as needed for erectile dysfunction., Disp: , Rfl:    Spacer/Aero-Holding Chambers DEVI, 1 Device by Does not apply route as directed., Disp: 1 each, Rfl: 1   traZODone  (DESYREL ) 100 MG tablet, TAKE 2 TABLETS BY MOUTH AT BEDTIME., Disp: 180 tablet, Rfl: 3   triamcinolone  (NASACORT ) 55 MCG/ACT AERO nasal inhaler, Place 1 spray into the nose every morning. (Patient not taking: Reported on 07/02/2024),  Disp: 50.7 mL, Rfl: 1   zinc  gluconate 50 MG tablet, Take 50 mg by mouth daily., Disp: , Rfl:   "

## 2024-11-15 ENCOUNTER — Ambulatory Visit: Admitting: Physical Medicine and Rehabilitation

## 2024-11-26 ENCOUNTER — Ambulatory Visit: Admitting: Behavioral Health

## 2024-12-02 ENCOUNTER — Encounter: Admitting: Physical Medicine and Rehabilitation

## 2024-12-19 ENCOUNTER — Ambulatory Visit: Admitting: Allergy & Immunology
